# Patient Record
Sex: Male | Born: 1955 | Race: White | Hispanic: No | Marital: Married | Smoking: Never smoker
Health system: Southern US, Community
[De-identification: ages and names within clinical notes are randomized; demographics above are authoritative.]

## PROBLEM LIST (undated history)

## (undated) DIAGNOSIS — K219 Gastro-esophageal reflux disease without esophagitis: Secondary | ICD-10-CM

## (undated) DIAGNOSIS — J189 Pneumonia, unspecified organism: Secondary | ICD-10-CM

## (undated) DIAGNOSIS — G4730 Sleep apnea, unspecified: Secondary | ICD-10-CM

## (undated) DIAGNOSIS — J9621 Acute and chronic respiratory failure with hypoxia: Secondary | ICD-10-CM

## (undated) DIAGNOSIS — M60009 Infective myositis, unspecified site: Secondary | ICD-10-CM

## (undated) DIAGNOSIS — C8190 Hodgkin lymphoma, unspecified, unspecified site: Secondary | ICD-10-CM

## (undated) DIAGNOSIS — K859 Acute pancreatitis without necrosis or infection, unspecified: Secondary | ICD-10-CM

## (undated) DIAGNOSIS — F419 Anxiety disorder, unspecified: Secondary | ICD-10-CM

## (undated) DIAGNOSIS — J80 Acute respiratory distress syndrome: Secondary | ICD-10-CM

## (undated) DIAGNOSIS — I10 Essential (primary) hypertension: Secondary | ICD-10-CM

## (undated) DIAGNOSIS — D62 Acute posthemorrhagic anemia: Secondary | ICD-10-CM

## (undated) DIAGNOSIS — E119 Type 2 diabetes mellitus without complications: Secondary | ICD-10-CM

## (undated) DIAGNOSIS — M009 Pyogenic arthritis, unspecified: Secondary | ICD-10-CM

## (undated) DIAGNOSIS — U071 COVID-19: Secondary | ICD-10-CM

## (undated) DIAGNOSIS — Q6589 Other specified congenital deformities of hip: Secondary | ICD-10-CM

## (undated) DIAGNOSIS — J9383 Other pneumothorax: Secondary | ICD-10-CM

## (undated) HISTORY — PX: OTHER SURGICAL HISTORY: SHX169

## (undated) HISTORY — PX: JOINT REPLACEMENT: SHX530

## (undated) HISTORY — DX: Acute pancreatitis without necrosis or infection, unspecified: K85.90

## (undated) HISTORY — PX: HERNIA REPAIR: SHX51

## (undated) HISTORY — DX: Sleep apnea, unspecified: G47.30

## (undated) HISTORY — DX: Infective myositis, unspecified site: M60.009

## (undated) HISTORY — DX: Hodgkin lymphoma, unspecified, unspecified site: C81.90

---

## 1998-08-04 ENCOUNTER — Emergency Department (HOSPITAL_COMMUNITY): Admission: EM | Admit: 1998-08-04 | Discharge: 1998-08-04 | Payer: Self-pay | Admitting: Emergency Medicine

## 1998-08-04 ENCOUNTER — Encounter: Payer: Self-pay | Admitting: Psychology

## 1998-10-01 ENCOUNTER — Emergency Department (HOSPITAL_COMMUNITY): Admission: EM | Admit: 1998-10-01 | Discharge: 1998-10-01 | Payer: Self-pay | Admitting: Emergency Medicine

## 1998-11-23 ENCOUNTER — Emergency Department (HOSPITAL_COMMUNITY): Admission: EM | Admit: 1998-11-23 | Discharge: 1998-11-23 | Payer: Self-pay | Admitting: Emergency Medicine

## 1998-11-23 ENCOUNTER — Encounter: Payer: Self-pay | Admitting: Emergency Medicine

## 2000-02-02 ENCOUNTER — Encounter: Payer: Self-pay | Admitting: Family Medicine

## 2000-02-02 ENCOUNTER — Ambulatory Visit (HOSPITAL_COMMUNITY): Admission: RE | Admit: 2000-02-02 | Discharge: 2000-02-02 | Payer: Self-pay | Admitting: Family Medicine

## 2000-02-16 ENCOUNTER — Encounter: Payer: Self-pay | Admitting: Family Medicine

## 2000-02-16 ENCOUNTER — Ambulatory Visit (HOSPITAL_COMMUNITY): Admission: RE | Admit: 2000-02-16 | Discharge: 2000-02-16 | Payer: Self-pay | Admitting: Family Medicine

## 2000-05-09 ENCOUNTER — Ambulatory Visit (HOSPITAL_COMMUNITY): Admission: RE | Admit: 2000-05-09 | Discharge: 2000-05-09 | Payer: Self-pay | Admitting: Family Medicine

## 2000-05-09 ENCOUNTER — Encounter: Payer: Self-pay | Admitting: Family Medicine

## 2000-08-25 ENCOUNTER — Encounter: Admission: RE | Admit: 2000-08-25 | Discharge: 2000-08-25 | Payer: Self-pay | Admitting: Family Medicine

## 2000-08-25 ENCOUNTER — Encounter: Payer: Self-pay | Admitting: Family Medicine

## 2000-12-03 ENCOUNTER — Emergency Department (HOSPITAL_COMMUNITY): Admission: EM | Admit: 2000-12-03 | Discharge: 2000-12-03 | Payer: Self-pay | Admitting: Emergency Medicine

## 2000-12-03 ENCOUNTER — Encounter: Payer: Self-pay | Admitting: Internal Medicine

## 2001-02-27 ENCOUNTER — Encounter: Payer: Self-pay | Admitting: Orthopedic Surgery

## 2001-03-05 ENCOUNTER — Inpatient Hospital Stay (HOSPITAL_COMMUNITY): Admission: RE | Admit: 2001-03-05 | Discharge: 2001-03-09 | Payer: Self-pay | Admitting: Orthopedic Surgery

## 2001-03-05 ENCOUNTER — Encounter: Payer: Self-pay | Admitting: Orthopedic Surgery

## 2002-12-24 ENCOUNTER — Ambulatory Visit (HOSPITAL_BASED_OUTPATIENT_CLINIC_OR_DEPARTMENT_OTHER): Admission: RE | Admit: 2002-12-24 | Discharge: 2002-12-24 | Payer: Self-pay | Admitting: Family Medicine

## 2004-05-18 ENCOUNTER — Ambulatory Visit: Payer: Self-pay | Admitting: Family Medicine

## 2004-05-21 ENCOUNTER — Ambulatory Visit: Payer: Self-pay | Admitting: Family Medicine

## 2004-06-01 ENCOUNTER — Ambulatory Visit: Payer: Self-pay

## 2004-06-08 ENCOUNTER — Ambulatory Visit: Payer: Self-pay | Admitting: Family Medicine

## 2004-07-16 ENCOUNTER — Ambulatory Visit: Payer: Self-pay | Admitting: Family Medicine

## 2005-05-11 ENCOUNTER — Inpatient Hospital Stay (HOSPITAL_COMMUNITY): Admission: RE | Admit: 2005-05-11 | Discharge: 2005-05-14 | Payer: Self-pay | Admitting: Orthopedic Surgery

## 2006-08-11 ENCOUNTER — Encounter: Payer: Self-pay | Admitting: Internal Medicine

## 2006-08-11 ENCOUNTER — Ambulatory Visit: Payer: Self-pay | Admitting: Internal Medicine

## 2007-03-23 ENCOUNTER — Encounter (INDEPENDENT_AMBULATORY_CARE_PROVIDER_SITE_OTHER): Payer: Self-pay | Admitting: *Deleted

## 2007-03-23 ENCOUNTER — Ambulatory Visit: Payer: Self-pay | Admitting: Internal Medicine

## 2007-03-23 DIAGNOSIS — E291 Testicular hypofunction: Secondary | ICD-10-CM

## 2007-03-23 DIAGNOSIS — G4730 Sleep apnea, unspecified: Secondary | ICD-10-CM | POA: Insufficient documentation

## 2007-03-23 DIAGNOSIS — E739 Lactose intolerance, unspecified: Secondary | ICD-10-CM | POA: Insufficient documentation

## 2007-04-13 ENCOUNTER — Ambulatory Visit: Payer: Self-pay | Admitting: Internal Medicine

## 2007-04-17 ENCOUNTER — Encounter: Payer: Self-pay | Admitting: Internal Medicine

## 2007-04-26 ENCOUNTER — Ambulatory Visit: Payer: Self-pay | Admitting: Internal Medicine

## 2007-04-26 ENCOUNTER — Encounter: Payer: Self-pay | Admitting: Internal Medicine

## 2007-04-26 HISTORY — PX: COLONOSCOPY: SHX174

## 2007-05-15 ENCOUNTER — Encounter: Payer: Self-pay | Admitting: Internal Medicine

## 2007-10-22 IMAGING — CR DG HIP (WITH OR WITHOUT PELVIS) 2-3V*L*
3 series · 3 of 3 positions shown · non-contrast
Comparison: None.

CLINICAL DATA: Left hip pain.  Osteoarthritis.
 LEFT HIP AND PELVIS - 3 VIEW:

[t pelvis a.p.]
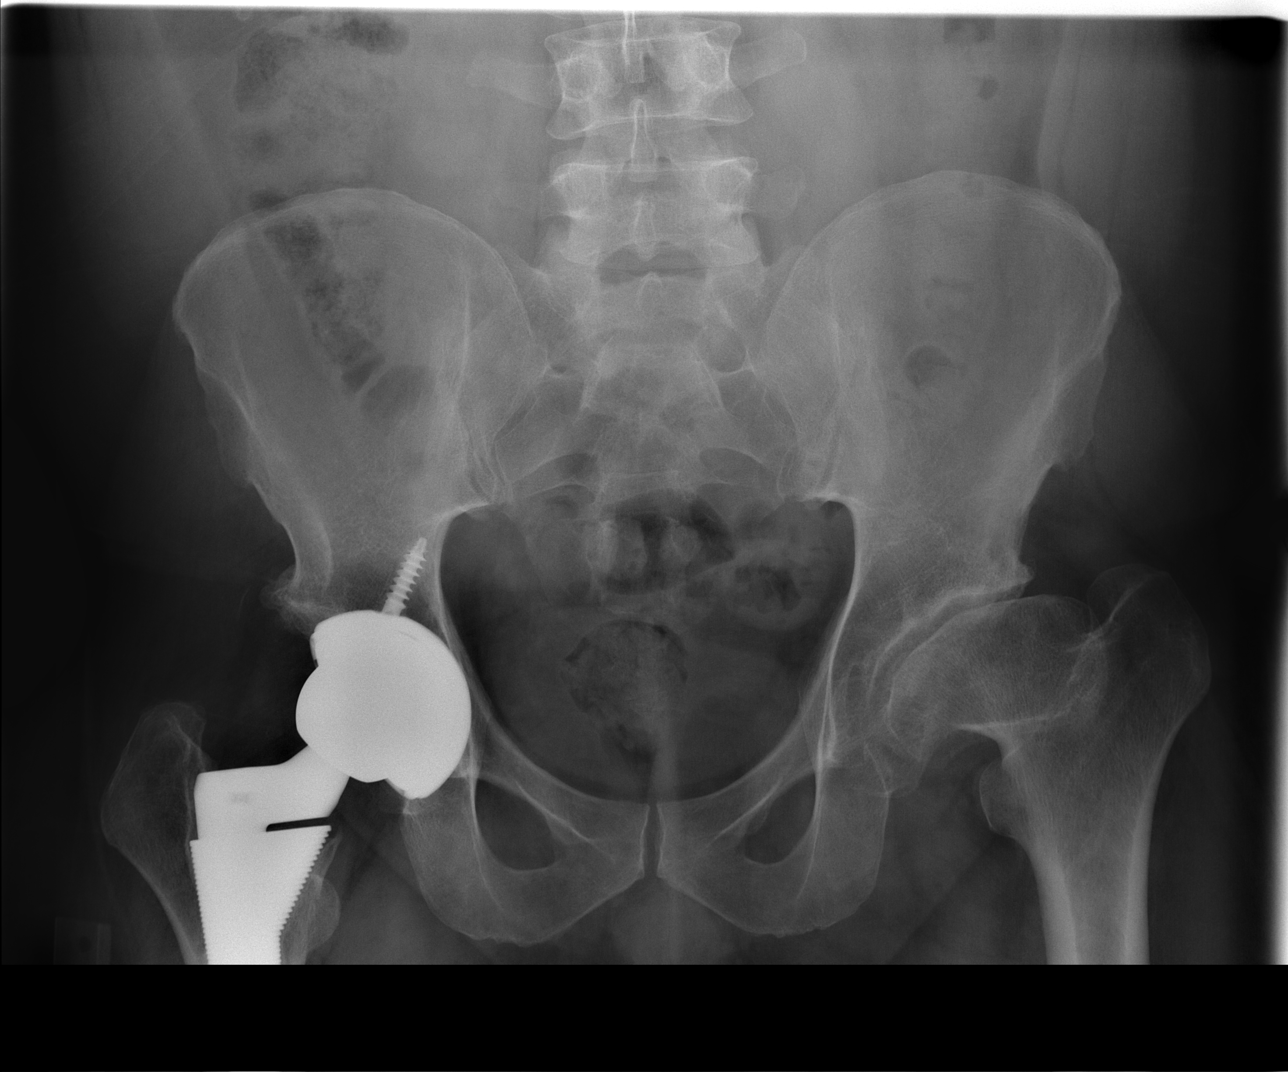

[t hip ap left]
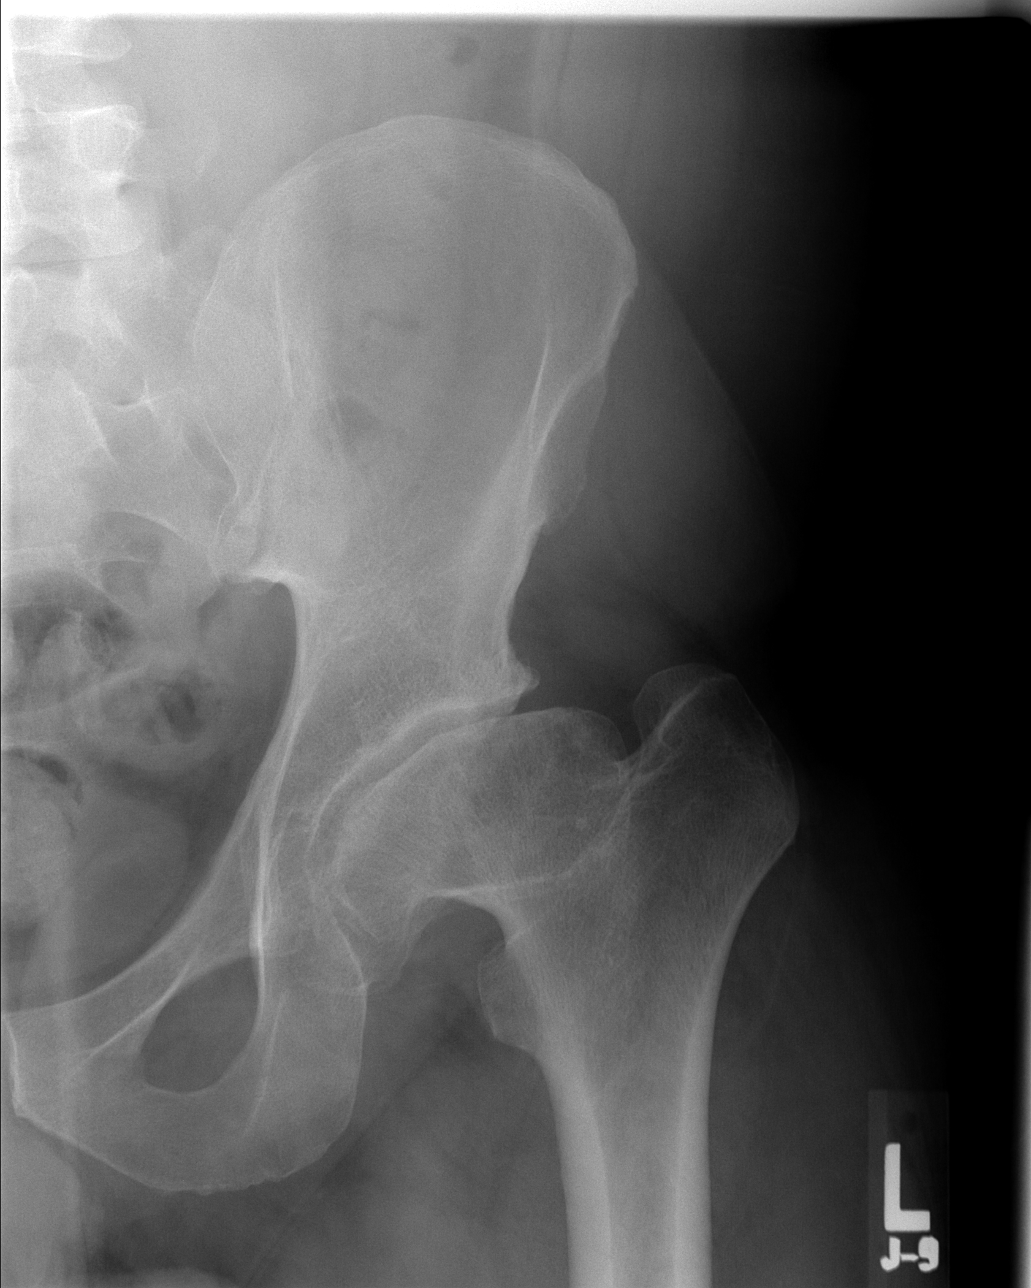

[t hip frog leg left]
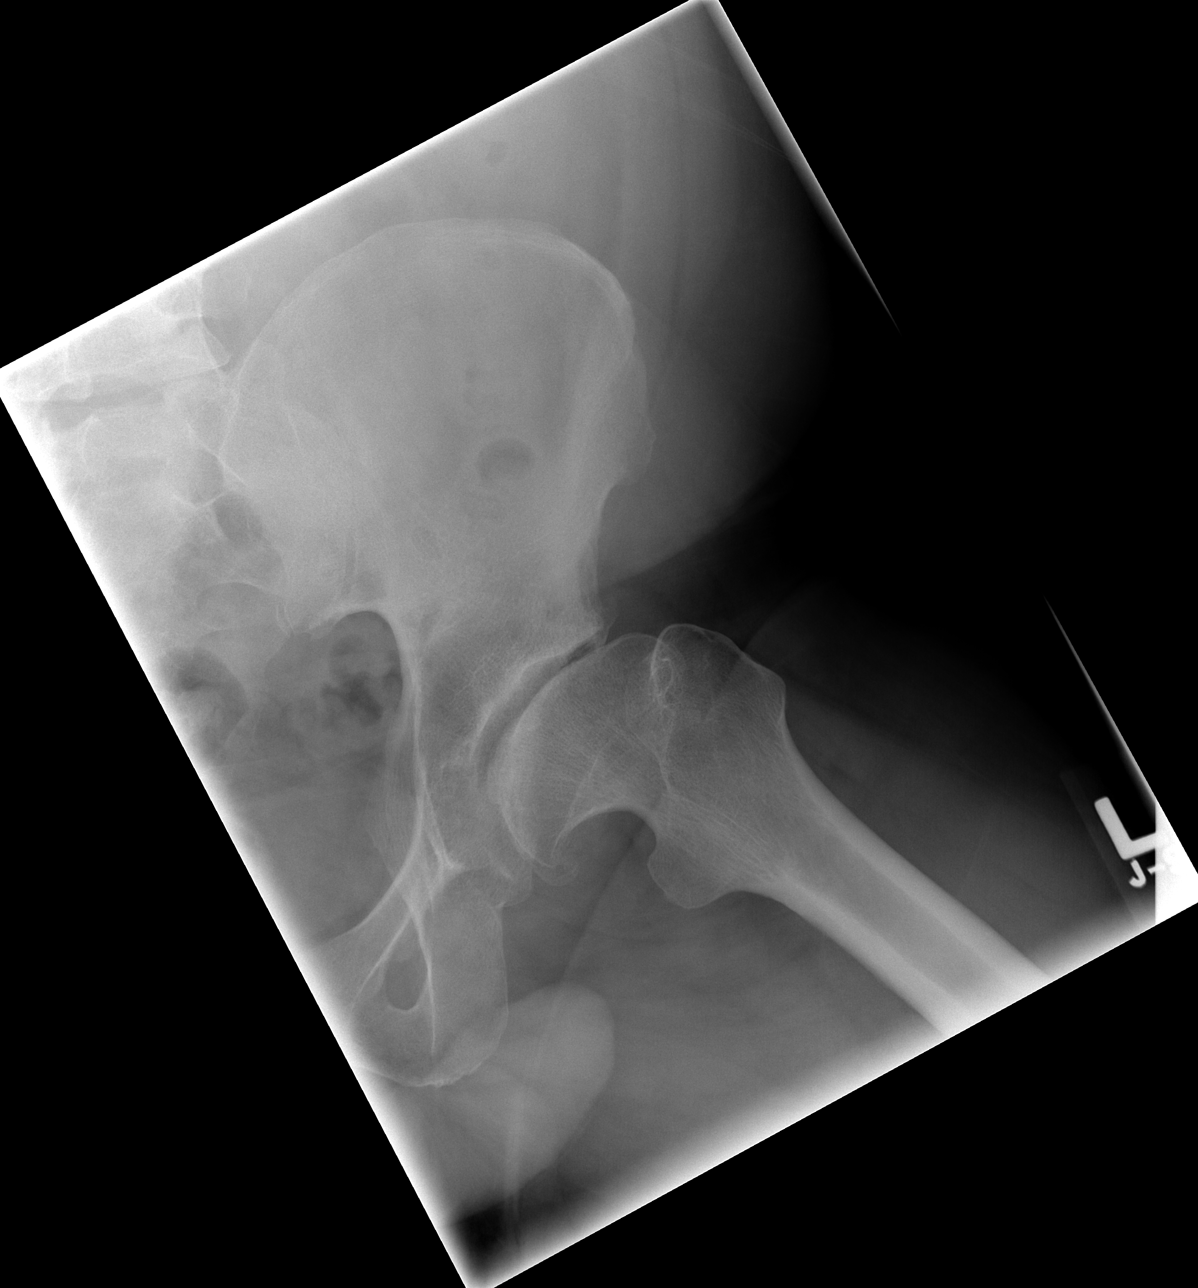

[3 of 3 positions shown; findings below may reference images not displayed]

FINDINGS: Frontal pelvis shows the patient to be status post a right total hip replacement.  The left hip is dysmorphic with flattening of the femoral head.  Acetabular reticular cortex is irregular and sclerotic.  
 No evidence for acute fracture.
IMPRESSION: Dysmorphic left hip with irregularity of the cortical surfaces and subchondral sclerosis.

## 2007-10-22 IMAGING — CR DG CHEST 2V
2 series · 2 of 2 positions shown · non-contrast
Comparison: 12/03/00.

CLINICAL DATA: Pre-op evaluation for left hip osteoarthritis. 
 CHEST - 2 VIEW ? 05/06/05:

[w chest pa *]
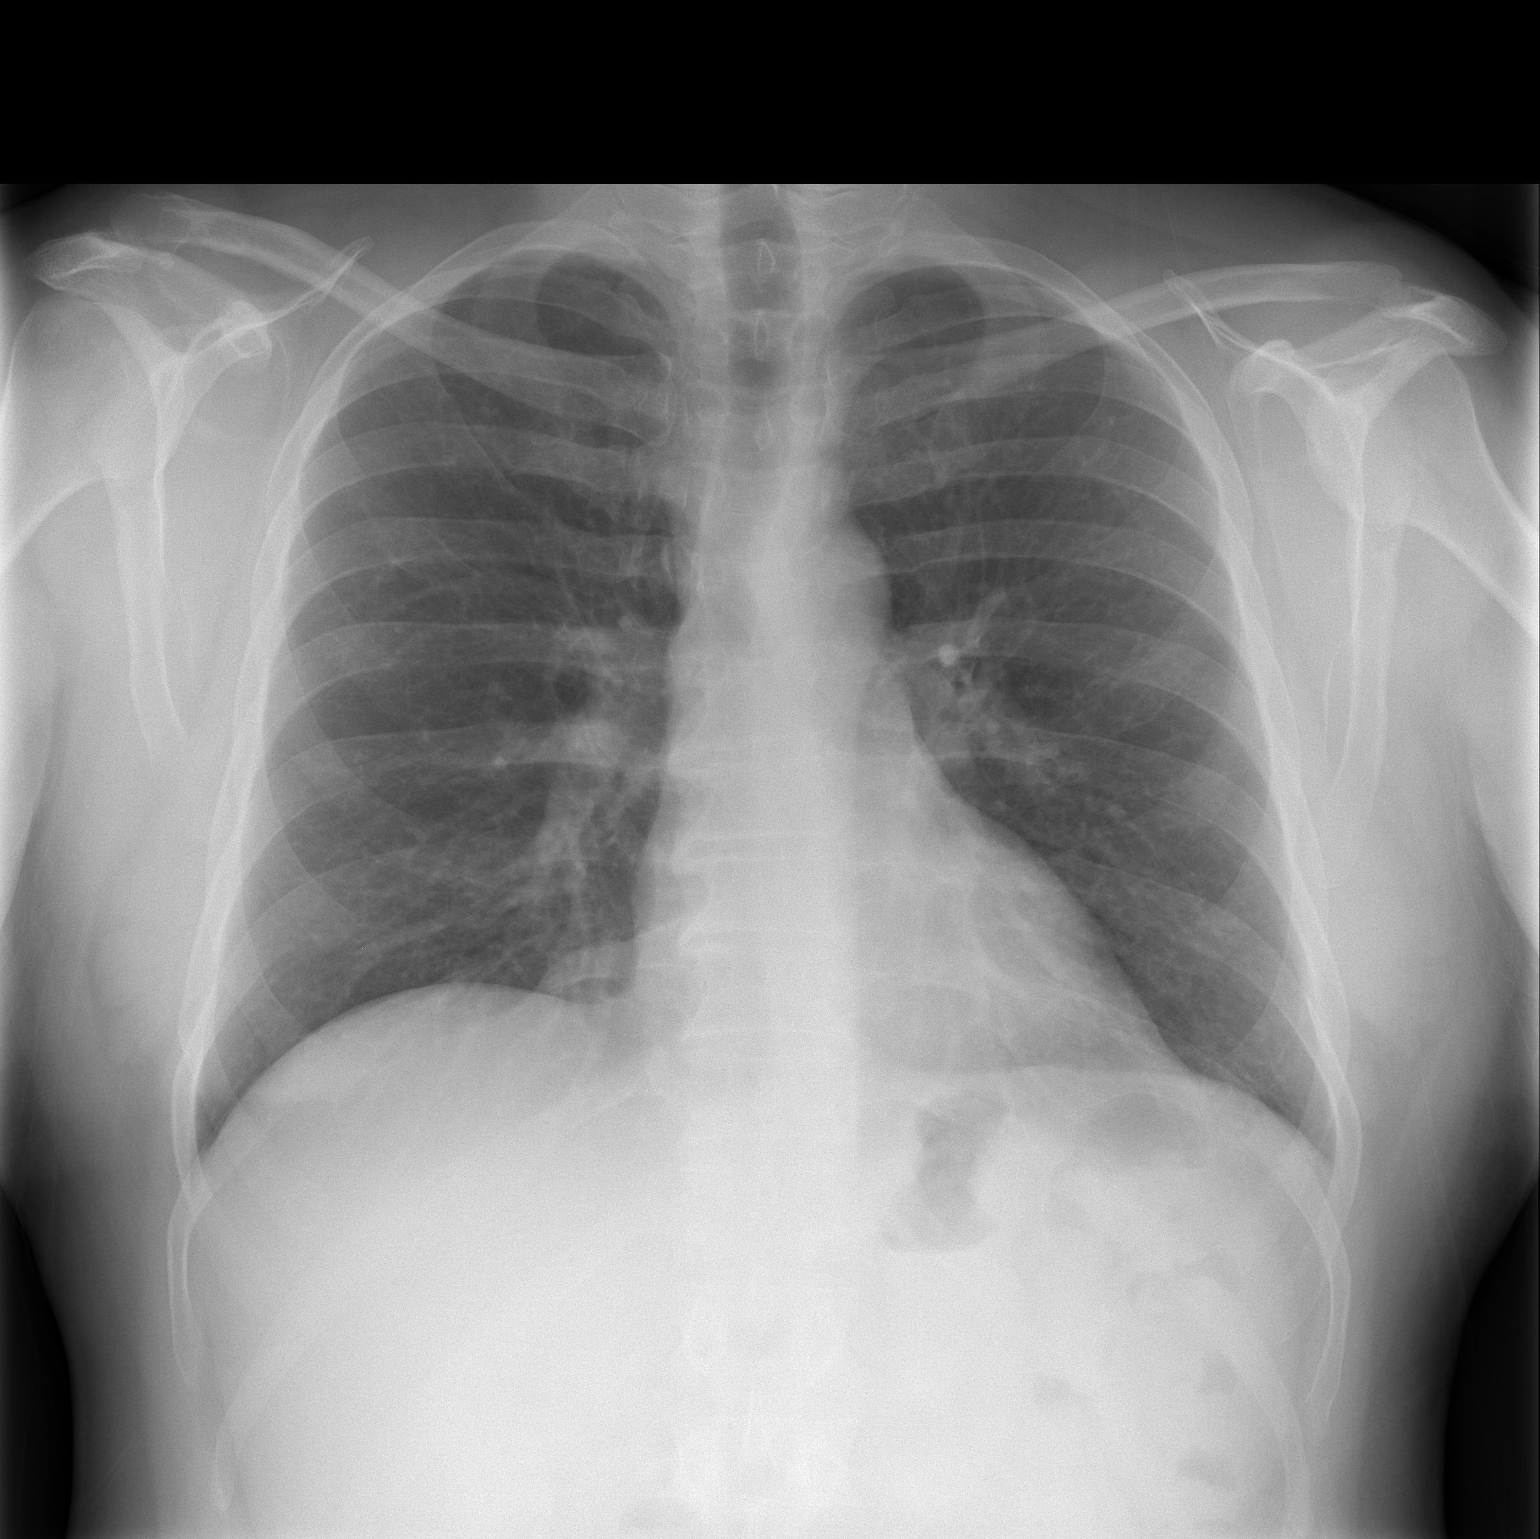

[w chest lat *]
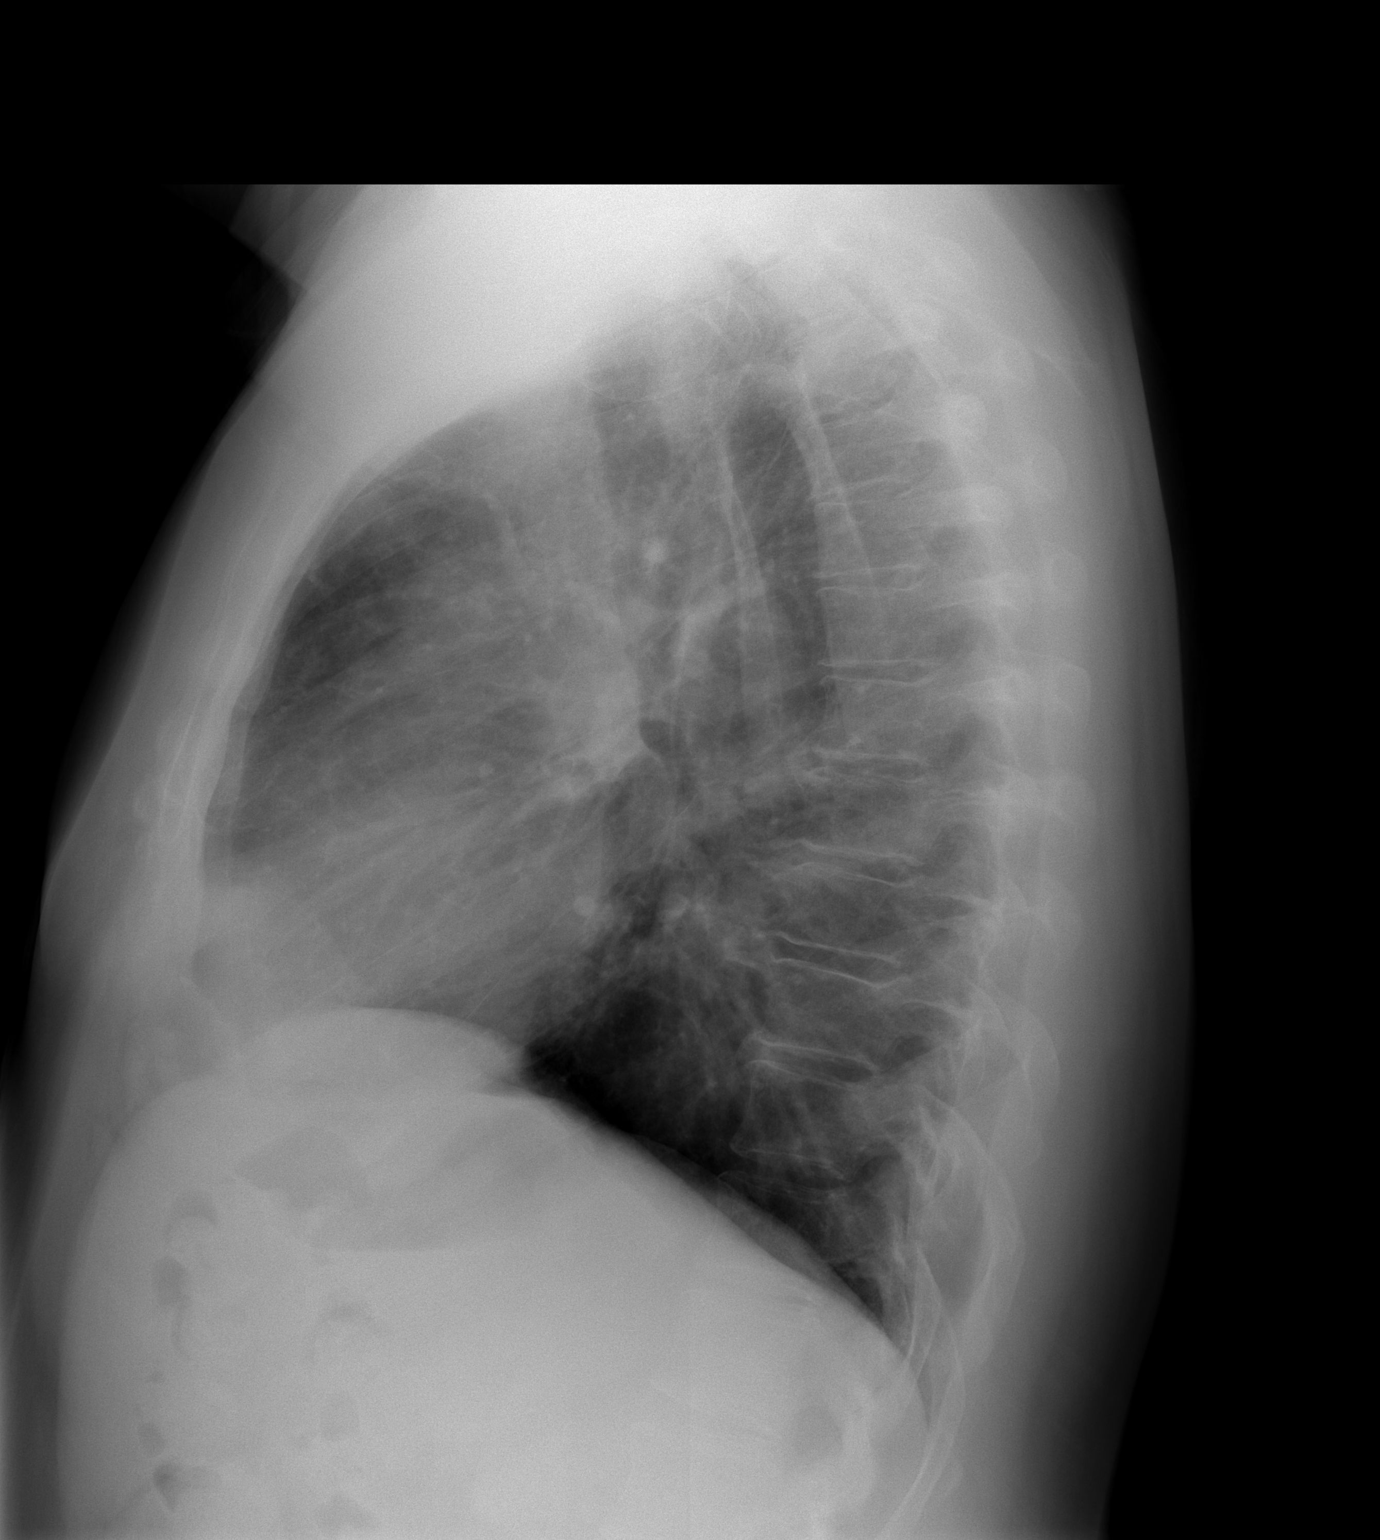

[2 of 2 positions shown; findings below may reference images not displayed]

The heart size and mediastinal contours are within normal limits.  Both lungs are clear.  The visualized skeletal structures are unremarkable.
IMPRESSION: No active cardiopulmonary disease.

## 2007-10-27 IMAGING — CR DG PORTABLE PELVIS
1 series · 1 of 1 positions shown · non-contrast
Comparison: 05/06/2005.

CLINICAL DATA: Status-post left total hip arthroplasty.
 LEFT HIP, ONE VIEW ? 05/11/2005 ? (2535 HOURS):

[view not recorded]
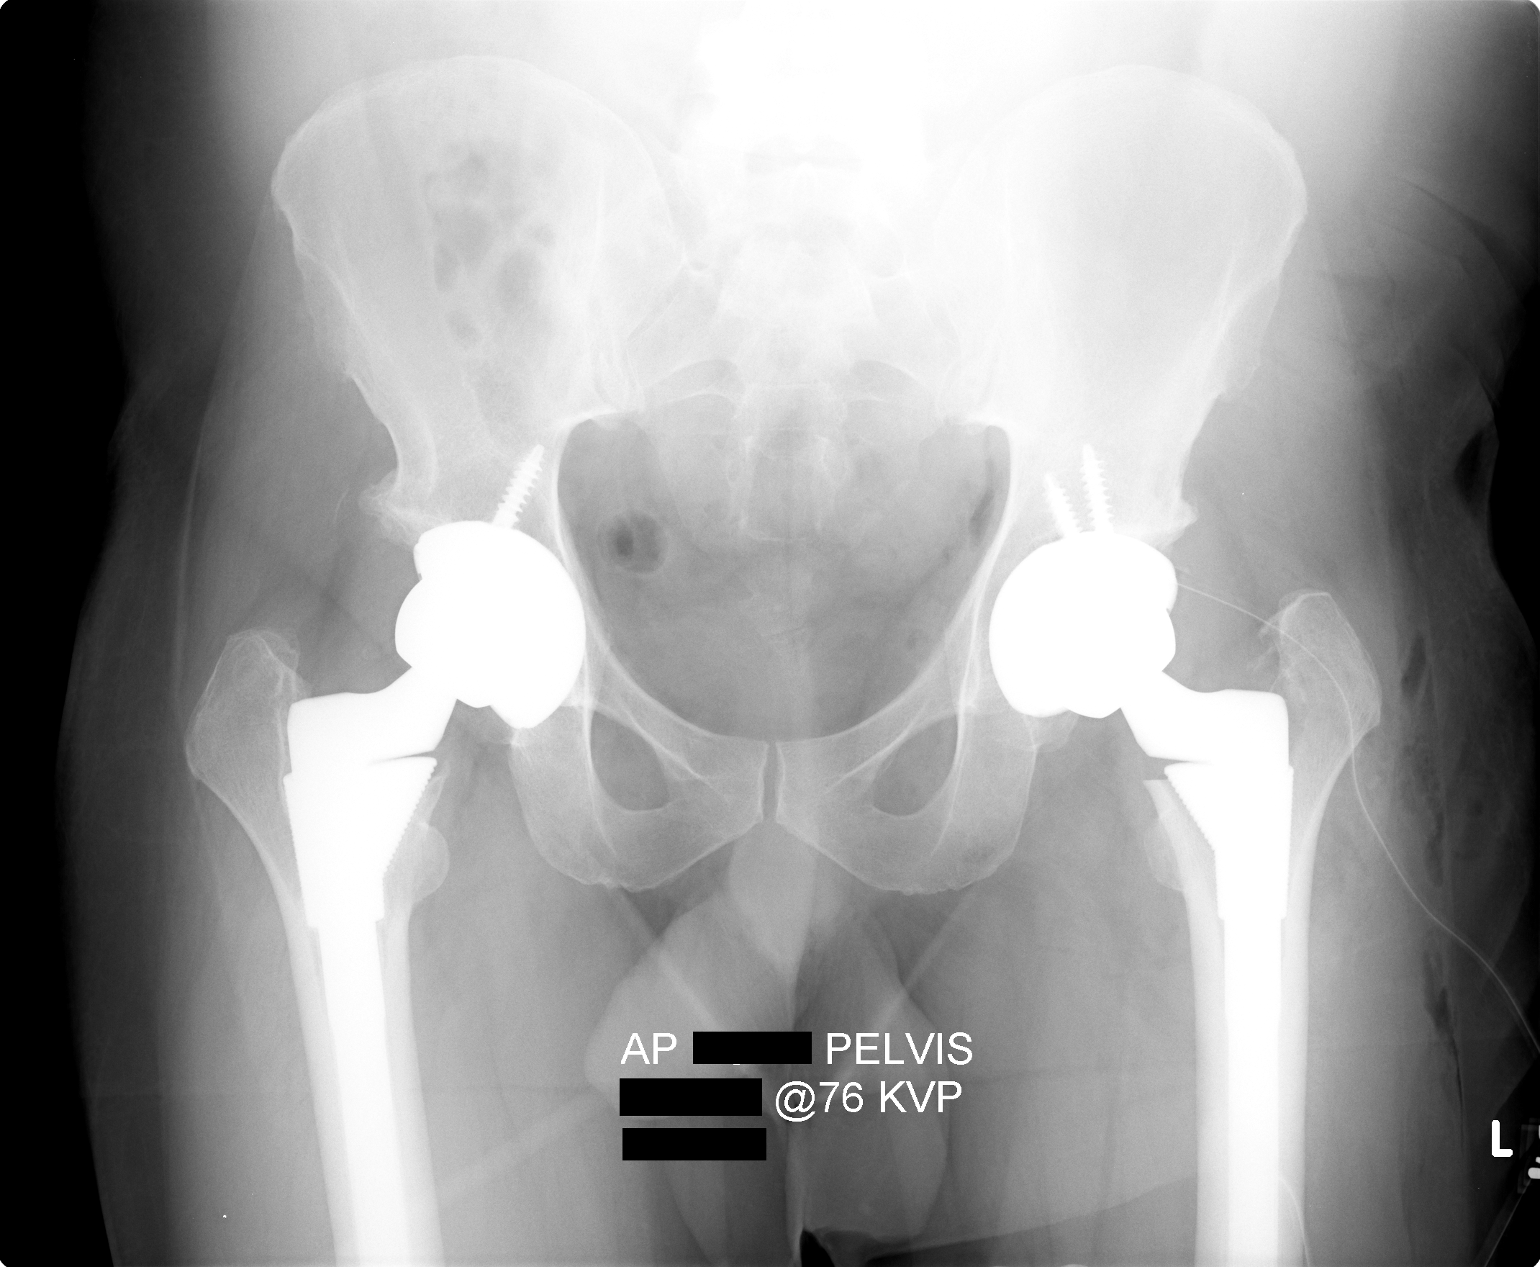

[1 of 1 positions shown; findings below may reference images not displayed]

FINDINGS: Frontal projection obtained in recovery demonstrates normal alignment of left acetabular and femoral components of a total left hip arthroplasty.
IMPRESSION: Normal alignment of left hip arthroplasty.
 PELVIS, ONE VIEW ? 05/11/2005:
FINDINGS: Frontal projection shows normal alignment of the newly placed left hip arthroplasty, as well as the previously placed right hip arthroplasty.  The rest of the bony pelvis is intact.
IMPRESSION: Normal alignment of newly placed left hip arthroplasty.

## 2007-10-27 IMAGING — CR DG HIP 1V*L*
1 series · 1 of 1 positions shown · non-contrast
Comparison: 05/06/2005.

CLINICAL DATA: Status-post left total hip arthroplasty.
 LEFT HIP, ONE VIEW ? 05/11/2005 ? (2535 HOURS):

[view not recorded]
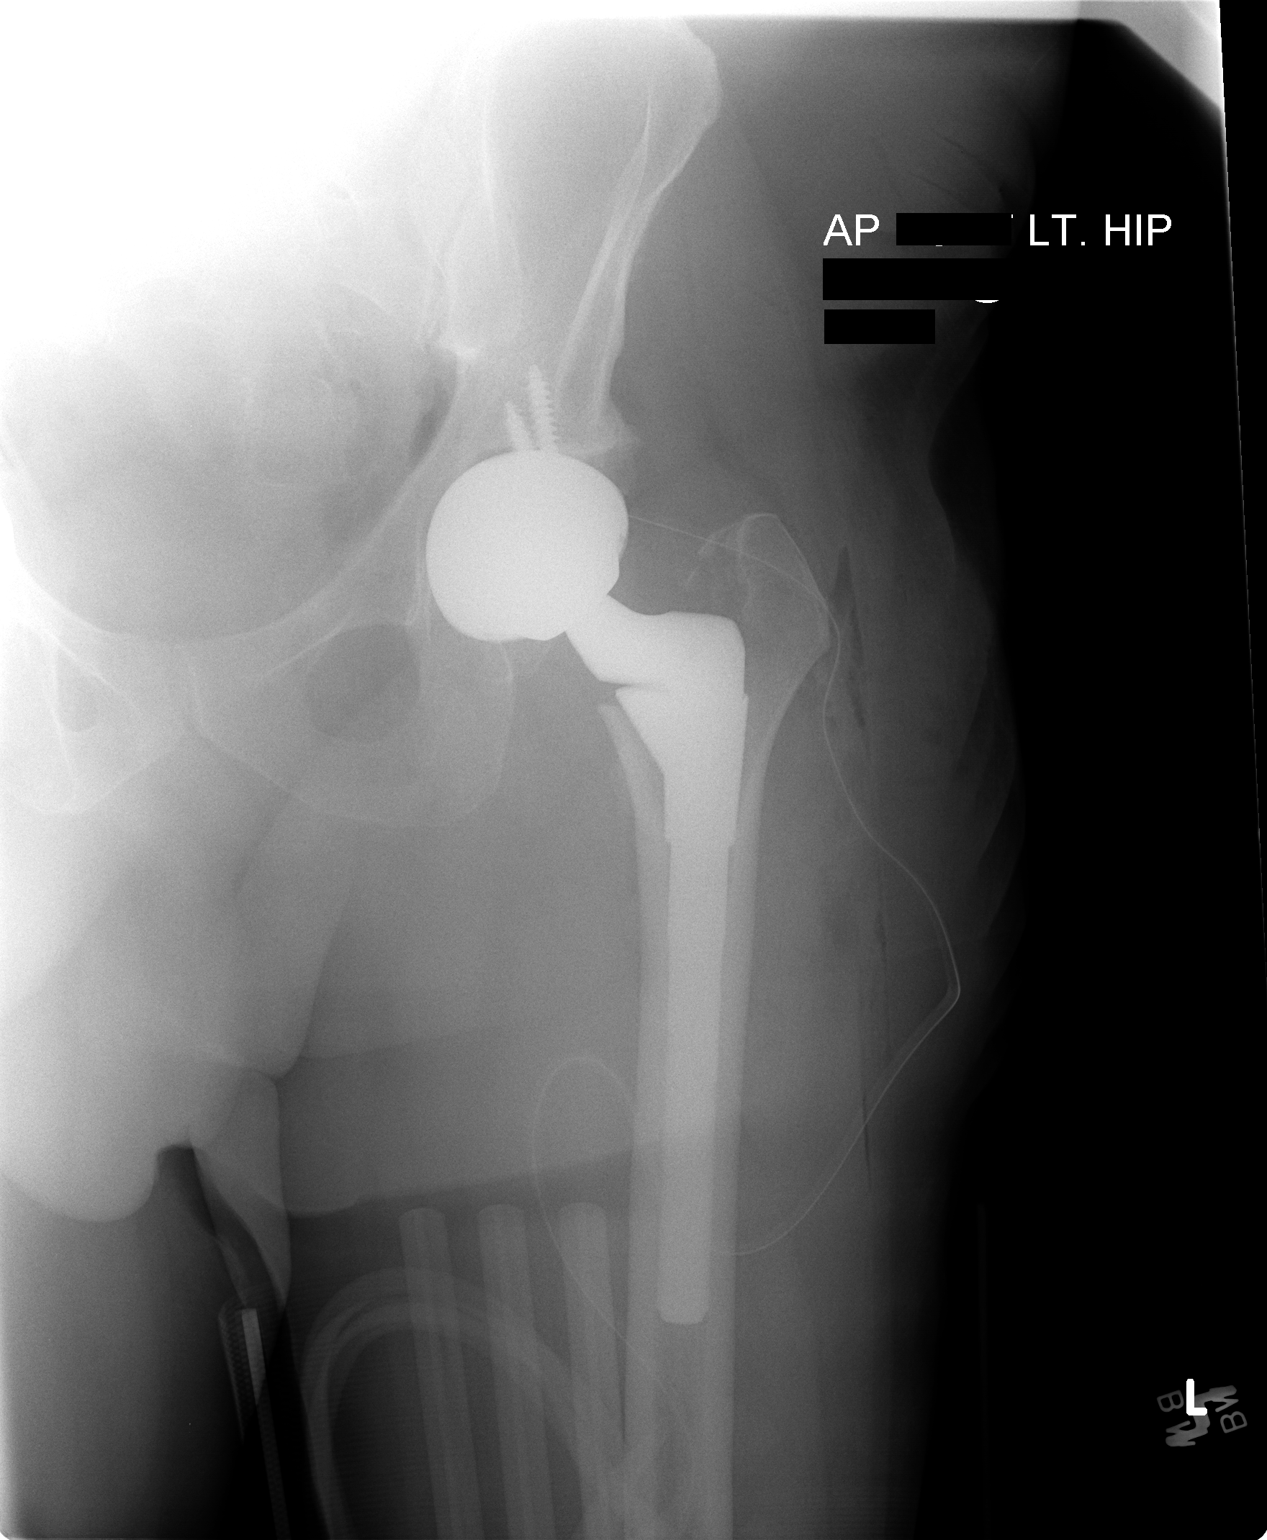

[1 of 1 positions shown; findings below may reference images not displayed]

FINDINGS: Frontal projection obtained in recovery demonstrates normal alignment of left acetabular and femoral components of a total left hip arthroplasty.
IMPRESSION: Normal alignment of left hip arthroplasty.
 PELVIS, ONE VIEW ? 05/11/2005:
FINDINGS: Frontal projection shows normal alignment of the newly placed left hip arthroplasty, as well as the previously placed right hip arthroplasty.  The rest of the bony pelvis is intact.
IMPRESSION: Normal alignment of newly placed left hip arthroplasty.

## 2007-11-06 ENCOUNTER — Telehealth (INDEPENDENT_AMBULATORY_CARE_PROVIDER_SITE_OTHER): Payer: Self-pay | Admitting: *Deleted

## 2010-05-09 LAB — CONVERTED CEMR LAB
ALT: 30 units/L (ref 0–53)
AST: 21 units/L (ref 0–37)
BUN: 11 mg/dL (ref 6–23)
Basophils Absolute: 0 10*3/uL (ref 0.0–0.1)
Bilirubin Urine: NEGATIVE
Blood in Urine, dipstick: NEGATIVE
CO2: 32 meq/L (ref 19–32)
Calcium: 9.2 mg/dL (ref 8.4–10.5)
Eosinophils Absolute: 0.1 10*3/uL (ref 0.0–0.6)
Eosinophils Relative: 1.3 % (ref 0.0–5.0)
GFR calc non Af Amer: 108 mL/min
Glucose, Urine, Semiquant: NEGATIVE
HDL: 22.6 mg/dL — ABNORMAL LOW (ref >39.0)
Ketones, urine, test strip: NEGATIVE
MCV: 87.5 fL (ref 78.0–100.0)
Neutro Abs: 3.7 10*3/uL (ref 1.4–7.7)
Neutrophils Relative %: 47.9 % (ref 43.0–77.0)
Nitrite: NEGATIVE
PSA: 0.83 ng/mL (ref 0.10–4.00)
Platelets: 247 10*3/uL (ref 150–400)
Protein, U semiquant: NEGATIVE
Sex Hormone Binding: 14 nmol/L (ref 13–71)
Sodium: 142 meq/L (ref 135–145)
Specific Gravity, Urine: 1.015
TSH: 2.21 microintl units/mL (ref 0.35–5.50)
Testosterone: 149.12 ng/dL — ABNORMAL LOW (ref 350.00–890)
Testosterone: 163.22 ng/dL — ABNORMAL LOW (ref 350–890)
Total CHOL/HDL Ratio: 7.5
Urobilinogen, UA: NEGATIVE
VLDL: 28 mg/dL (ref 0–40)
WBC Urine, dipstick: NEGATIVE
WBC: 7.7 10*3/uL (ref 4.5–10.5)
pH: 5

## 2010-08-27 NOTE — Discharge Summary (Signed)
NAMEKAL, WASSMUTH             ACCOUNT NO.:  [id number]   MEDICAL RECORD NO.:  [id number]          PATIENT TYPE:  INP   LOCATION:  1505                         FACILITY:  [facility]   PHYSICIAN:  Gaynelle Arabian, M.D.    DATE OF BIRTH:  1955/04/24   DATE OF ADMISSION:  05/11/2005  DATE OF DISCHARGE:  05/14/2005                                 DISCHARGE SUMMARY   ADMISSION DIAGNOSES:  1.  Perthes disease left hip.  2.  Reflux disease.   DISCHARGE DIAGNOSES:  1.  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  2.  Postoperative hyponatremia.  3.  Mild postoperative blood loss anemia that did not require transfusion.  4.  Reflux disease.   PROCEDURE:  On May 11, 2005 left total hip, surgeon Dr. Wynelle Link,  assistant Arlee Muslim, PA-C. Anesthesia general.   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 He has gone  on to significant degenerative changes and now presents for a total hip  arthroplasty.   CONSULTATIONS:  None.   LABORATORY DATA:  Preop CBC, hemoglobin 14.5, hematocrit 42.0, white count  normal 8.2. Postop hemoglobin 11.6. Last noted H&H 10.1 and 29.1. PT and PTT  preop 13.3 and 33 respectively. INR 1.0, serial pro times followed. Last  noted PT/INR 23.2 and 2.0. Chem panel on admission all within normal limits.  Postop sodium dropped from 137 to 134, stabilized at 134. Remaining  electrolytes remained within normal limits. Calcium dropped from 9.2 to 7.8.  Urinalysis preop negative. Blood group type A negative. Two view chest preop  May 06, 2005 acute cardiopulmonary disease. Left hip films May 06, 2005 dysmorphic left hip with irregularity of the cortical surfaces and  subchondral sclerosis.   HOSPITAL COURSE:  The patient was admitted to [facility], taken to  the OR and underwent the above stated  procedure, tolerated it well and was  later transferred to the recovery room and the orthopedic floor for  postoperative care. Vital signs were followed, started on PCA and p.o.  analgesics for pain control following surgery. He was young and healthy,  felt he would do well with this therapy. Physical therapy started on  postoperative day 1, hemovac drain pulled on day 1 which was placed at the  time of surgery. Postop hemoglobin 11.6, added protonix as needed for  reflux. He actually did well with his physical therapy, getting up out of  bed with PT. By day 2, there was some soreness but doing well on his pain  control, weaned over to p.o. medications, ambulated 200 feet that morning  and again 200 feet later that afternoon. He was seen in morning rounds and  felt if he did well he would be going home. Arrangements were made on day 2  and he was ready to go by day 3. Dressing was changed on day 2, incision  looked good. He was seen in rounds on day 3 doing well. His INR was 2.0  and  he was discharged home.   PLAN:  1.  The patient was discharged home on May 14, 2005.  2.  Discharge diagnoses, please see above.  3.  Discharge meds; Coumadin, Percocet, Robaxin.  4.  Diet as tolerated.  5.  Activity - partial weight bearing 25-50% to the left lower extremity.      Home health PT, home health nursing, gait training, ambulation, ADLs,      total protocol.   FOLLOW UP:  2 weeks.   DISPOSITION:  Home.   CONDITION ON DISCHARGE:  Improved.      Alexzandrew L. Dara Lords, P.A.      Gaynelle Arabian, M.D.  Electronically Signed    ALP/MEDQ  D:  06/13/2005  T:  06/14/2005  Job:  [id number]   cc:   Wellington Hampshire, M.D. [facility]  [phone]  [place] [address]

## 2010-08-27 NOTE — H&P (Signed)
NAMEGARRY, DIETL             ACCOUNT NO.:  [id number]   MEDICAL RECORD NO.:  [id number]          PATIENT TYPE:  INP   LOCATION:  NA                           FACILITY:  [facility]   PHYSICIAN:  Gaynelle Arabian, M.D.    DATE OF BIRTH:  08-01-1955   DATE OF ADMISSION:  05/11/2005  DATE OF DISCHARGE:                                HISTORY & PHYSICAL   DATE OF OFFICE VISIT HISTORY AND PHYSICAL:  May 05, 2005.   CHIEF COMPLAINT:  Left hip pain.   HISTORY OF PRESENT ILLNESS:  The patient is a 55 year old male known to Dr.  Wynelle Link having previously undergone a right total hip arthroplasty.  He has  done very well with his right hip a couple years out.  His left hip has  known progressive arthritis.  He is at a point here it has gotten worse.  It  is interfering with his daily activities.  He has reached a point where he  would like to have something done about it.  The risks and benefits have  been discussed, and the patient has elected to undergo the surgical  procedure.   ALLERGIES:  No known drug allergies.   CURRENT MEDICATIONS:  Vicodin p.r.n.   PAST MEDICAL HISTORY:  1.  Reflux disease.  2.  Arthritis.   PAST SURGICAL HISTORY:  1.  Right shoulder surgery for separation in 1996.  2.  Right total hip replacement in 2001.  3.  Right inguinal hernia repair x2.  4.  Left inguinal hernia repair.  5.  Mandible surgery.   SOCIAL HISTORY:  Married.  He is a Public affairs consultant.  Nonsmoker.  No  alcohol.  Two children.  Wife will be assisting with care after surgery.   FAMILY HISTORY:  Significant for lung cancer, skin cancer and arthritis.   REVIEW OF SYSTEMS:  GENERAL:  No fevers, chills or night sweats.  NEUROLOGIC:  No seizures, syncope or paralysis.  RESPIRATORY:  No shortness  of breath, productive cough or hemoptysis.  CARDIOVASCULAR:  No chest pain,  angina or orthopnea.  GI:  No nausea, vomiting, diarrhea or constipation.  GU:  No dysuria, hematuria or discharge.   MUSCULOSKELETAL:  Left hip found  in the history of present illness.   PHYSICAL EXAMINATION:  VITAL SIGNS:  Pulse 66, respirations 12, blood  pressure 128/74.  GENERAL:  A 55 year old white male, well-nourished, well-developed, in no  acute distress.  Alert, oriented and cooperative.  Good historian.  HEENT:  Normocephalic and atraumatic.  Pupils round and reactive.  Extraocular movements intact.  Oropharynx clear.  Extraocular movements  intact.  NECK:  Supple.  He does have upper and lower partial dentures.  CHEST:  Clear, anterior and posterior chest wall.  HEART:  Bradycardic rhythm.  Regular rhythm without murmur.  S1 and S2  noted.  ABDOMEN:  Soft, nontender.  Bowel sounds are present.  RECTAL/BREAST/GENITALIA:  Not done; not pertinent to present illness.  EXTREMITIES:  Left hip has flexion to 90 degrees, external rotation 10,  internal rotation 10, abduction 10.  Left leg is about three-quarters of an  inch shorter than the right.   IMPRESSION:  Perthes disease, left hip.   PLAN:  The patient admitted to [place] to undergo a left total  hip arthroplasty.  Surgery will be performed by Dr. Gaynelle Arabian.      Alexzandrew L. Dara Lords, P.A.      Gaynelle Arabian, M.D.  Electronically Signed    ALP/MEDQ  D:  05/10/2005  T:  05/10/2005  Job:  [id number]   cc:   Wellington Hampshire, M.D. [facility]  [phone]  [place] [address]

## 2010-08-27 NOTE — Discharge Summary (Signed)
[facility]  Patient:    Steve [name], Steve [name] Visit Number: [id number] MRN: [id number]          Service Type: SUR Location: 4W [id number] Attending Physician:  [name] Dictated by:   Trenda Moots Mahar, P.A. Admit Date:  03/05/2001 Discharge Date: 03/09/2001                             Discharge Summary  DATE OF BIRTH:  1956-03-02  ADMITTING DIAGNOSIS:  History of leg calf peripheral disease.  DISCHARGE DIAGNOSES: 1. Status post right total hip arthroplasty. 2. History of leg calf peripheral disease. 3. Postoperative hemorrhagic anemia which was stable and not symptomatic not    requiring transfusion.  PROCEDURE:  A right total hip arthroplasty by Dr. Gaynelle Arabian with the assistance of Tillie Rung, P.A.-C.  Anesthesia was general.  No complications.  Estimated blood loss 800 cc.  CONSULTATIONS:  None.  HISTORY OF PRESENT ILLNESS:  The patient is a 55 year old male who has a history of bilateral hip pain worse on the right as compared to the left.  The patient has a history of left calf peripheral disease for over 30 years.  He states that he has had increasing pain and has gotten to the point that it is almost constant in nature.  It does bother him at night and is interfering with his activities of daily living and his quality of life.  He was seen and evaluated by Dr. Wynelle Link at [facility] clinic and it was determined that he would benefit from operative intervention, specifically a total hip replacement.  Risks and benefits of this were discussed with the patient by Dr. Gaynelle Arabian.  He decided this was acceptable and he would like to proceed.  LABORATORY DATA AND X-RAY FINDINGS:  Prior to admission on February 27, 2001, CBC was within normal limits.  Complete metabolic panel was within normal limits with the exception of glucose at 121.  UA was normal.  Postoperatively, a CBC was ordered on November 25, and revealed WBC  of 24, RBC 4.09, hemoglobin 12.4, hematocrit 35.5, otherwise within normal limits.  Hemoglobin and hematocrit continued to be monitored throughout his postoperative course reaching a low of 9.9 and 28.0 respectively prior to discharge.  He was not symptomatic and did not require transfusion as result of any of the hemoglobin and hematocrits drawn.  Basic metabolic panel monitored postoperatively on November 25, with glucose 177, calcium 8.2, otherwise within normal limits. On November 26, glucose had decreased to 150, however, still above normal and a calcium of 8.1.  Otherwise, BMET was within normal limits.  Repeated on March 09, 2001, glucose was 124 and the rest of the BMET was within normal limits.  UA from March 06, 2001, revealed appearance to be cloudy with a small amount of hemoglobin, otherwise negative.  Urine cultures from March 06, 2001, revealed no growth at one day.  X-rays from February 27, 2001, of the right hip revealed marked degenerative changes noted of the right hip.  Changes presumably dating back to childhood. On March 05, 2001, portable x-rays of the hip revealed good position in AP portable view of the hip of right total hip prosthesis.  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  Good AP position on the right total hip prosthesis.  Pelvis shows no fracture.  There is no EKG found on the chart.  HOSPITAL  COURSE:  The patient was taken to the operative suite on March 05, 2001, for the above-listed procedure.  The patient tolerated the procedure very well without any intraoperative complications.  He was transferred to the recovery room in stable condition.  The patient was started on appropriate antibiotic course postoperatively and completed this course without any difficulty or complication.  His diet was slowly advanced to normal and he tolerated this very well.  Incentive spirometer was utilized hourly  while the patient was awakened in attempt to prevent any respiratory complications. Neurovascular checks were instituted postoperatively and remained intact without any problems throughout his hospital stay.  Pain was initially controlled utilizing PCA morphine.  He was eventually transitioned over to p.o. analgesics without any difficulty and pain control remained adequate. The patients Hemovac drain that was placed intraoperatively was discontinued on postop day #1 without any difficulty.  The patients operative dressing was taken down on postop day #2 revealing incision that looked very good without any signs or symptoms of infection.  Daily dressing changes were done throughout his hospital stay until discharge.  The incision continued to look very good without any signs or symptoms of infection.  Physical therapy and occupational therapy were consulted to work with the patient on his total hip protocol and precautions.  He was touchdown weightbearing to his operative extremity.  He progressed along very well with them eventually ambulating 50 feet completely independently.  For DVT prophylaxis, the patient was utilizing Arixtra and was not placed on Coumadin therapy.  For DVT prophylaxis, he was started immediately postoperatively on SCD devices as well as TED hose. Discharge planners were consulted to assist with setting up the patients home needs as well as arranging for home health services.  On March 09, 2001, the patient was doing very well.  He was orthopedically stable and had met all goals.  He was dramatically stable and ready for discharge to his home.  ASSESSMENT:  Right total hip arthroplasty doing very well and ready for discharge.  ACTIVITY:  Touchdown weightbearing to the right lower extremity.  Total hip protocol and precautions.  He is to use ice to the hip as needed for discomfort.  SPECIAL INSTRUCTIONS:  Daily dressing changes, supplies and instructions  were given for activity.  The patient may shower on postop day #5 and should keep the immobilizer on while asleep.  He is to follow up with Dr. Gaynelle Arabian  two weeks postoperatively and is instructed to call the office for an appointment for this.  DIET:  Preoperative as tolerated.  DISCHARGE MEDICATIONS: 1. Arixtra study, per protocol.  The patient has supplies and medications for    this. 2. Percocet p.r.n. pain. 3. Robaxin p.r.n. spasm.  CONDITION ON DISCHARGE:  Stable and improved.  DISPOSITION:  Discharged to his home with home health physical therapy per [place]. Dictated by:   Trenda Moots. Mahar, P.A. Attending Physician:  [name] DD:  03/26/01 TD:  03/27/01 Job: [id number]

## 2010-08-27 NOTE — Op Note (Signed)
NAMEFABIOLA, Steve [name]             ACCOUNT NO.:  [id number]   MEDICAL RECORD NO.:  [id number]          PATIENT TYPE:  INP   LOCATION:  X007                         [facility]ACILITY:  [facility]   PHYSICIAN:  Gaynelle Arabian, M.D.    DATE OF BIRTH:  28-Nov-1955   DATE OF PROCEDURE:  05/11/2005  DATE OF DISCHARGE:                                 OPERATIVE REPORT   PREOPERATIVE DIAGNOSIS:  Osteoarthritis left hip secondary to Perthes  disease.   POSTOPERATIVE DIAGNOSIS:  Osteoarthritis left hip secondary to Perthes  disease.   PROCEDURE:  Left total hip arthroplasty.   SURGEON:  Gaynelle Arabian, M.D.   ASSISTANT:  Alexzandrew L. Dara Lords, P.A.   ANESTHESIA:  General.   ESTIMATED BLOOD LOSS:  600.   DRAINS:  Hemovac x1.   COMPLICATIONS:  None.   CONDITION:  Stable to recovery.   CLINICAL NOTE:  Steve [name] is a 55 year old male who has end-stage degenerative  change of his left hip. He had childhood Perthes and he has gone on to  significant degenerative change. He has failed nonoperative management and  presents for total hip arthroplasty. He has had a previous very successful  right total hip arthroplasty.   PROCEDURE IN DETAIL:  After successful administration of general anesthetic,  the patient is placed in the right lateral decubitus position with his left  side up and held with a hip positioner. The left lower extremity is isolated  from his perineum with plastic drapes and prepped and draped in the usual  sterile fashion. A standard posterolateral incision is made with a 10 blade  through the subcutaneous tissue to the level of the fascia lata which was  incised in line with the skin incision. The sciatic nerve is palpated and  protected and the short rotators isolated off the femur. Capsulectomy is  performed and the hip is dislocated. The center of the femoral head is  marked but it is such a misshapen femoral head I was not sure if it was the  center. We placed the trial prosthesis such  that the center of the trial  head would correspond to the level at the tip of the greater trochanter. The  osteotomy line is marked on the femoral neck and osteotomy is made with an  oscillating saw. This is essentially the same height from the lesser  trochanter as the cut was on the other side based on his x-rays and  templating. The femoral head is resected and then femur retracted anteriorly  to gain acetabular exposure.   The acetabulum was significantly dysplastic. We removed the anterior labrum  and osteophytes around the rim. We reamed centrally with a 47 mm reamer  coursing in increments of 2 mm up to 53 mm and then a 54 mm pinnacle  acetabular shell was placed in anatomic position and transfixed with 2 dome  screws. The apex hole eliminator was then placed and the 36 mm neutral  Ultamet metal liner is placed. This is placed for a metal-on-metal hip  replacement.   The femur is prepared with the canal finder and then irrigation. Axial  reaming is  performed up to 13.5 mm, proximal reaming to 18B and the sleeve  machined to a small. An 18B small trial sleeve is placed with an 18 x 13  stem and a 36 plus 8 neck. His native anteversion was neutral so we dialed  in approximately 20 degrees of anteversion. A trial 36 plus 0 head is  placed. The hip is reduced with great stability. Full extension, full  external rotation, 70 degrees flexion, 40 degrees adduction and 90 degrees  internal rotation and 90 degrees of flexion and 70 degrees internal  rotation. He was short close to an inch preoperatively and now by placing  the left leg on top of the right it is felt as though the leg lengths are  equal. The hip is then dislocated and the trial femoral component removed.  The permanent 18B small sleeve is placed into the proximal femur and then  the 18 x 13 stem and a 36 plus8 neck placed again about 20 degrees beyond  his native anteversion. The 36 plus 0 head is then placed and the hip  is  reduced with the same stability parameters. The wound is copiously irrigated  saline solution and short rotators reattached to the femur through drill  holes. The fascia lata was closed over a Hemovac drain with interrupted #1  Vicryl, subcu closed with #1 and 2-0 Vicryl and subcuticular closed with a  running 4-0 Monocryl. The incision is cleaned and dried and Steri-Strips and  a bulky sterile dressing applied. The drain is hooked to suction and he is  awakened and transported to recovery in stable condition.      Gaynelle Arabian, M.D.  Electronically Signed     FA/MEDQ  D:  05/11/2005  T:  05/12/2005  Job:  [id number]

## 2010-08-27 NOTE — H&P (Signed)
[facility]  Patient:    Steve [name], Steve [name] Visit Number: [id number] MRN: [id number]          Service Type: Attending:  Gaynelle Arabian, M.D. Dictated by:   Avelina Laine, P.A.-C. Adm. Date:  03/05/01                           History and Physical  CHIEF COMPLAINT:  Bilateral hip pain, right greater than the left.  HISTORY OF PRESENT ILLNESS:  [name] is a 55 year old white male who has a history of bilateral hip pain, worse in the right as compared to the left.  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  The patient states that he has had increasing pain and interference with his activities of daily living over these years.  He was seen and evaluated by Dr. Wynelle Link in [facility], and it was determined that he would benefit from operative intervention.  ALLERGIES:  He states that postoperatively Vicodin caused him GI upset; however, this has not occurred when taken with food.  MEDICATIONS:  Vicodin one p.o. q.4-6h. p.r.n.  He states he takes these very sparingly.  He also takes Advil Liquid-Gel capsules p.r.n.  PAST MEDICAL HISTORY:  Noncontributory.  PAST SURGICAL HISTORY:  He has had a right shoulder surgery.  He has had a right inguinal hernia repair x 2 as well as the left inguinal hernia repair. He has had mandible surgery.  SOCIAL HISTORY:  He is married with two children.  He is a nonsmoker.  He states he has an occasional beer, maybe one to two a month.  He lives in a one-level home.  FAMILY PHYSICIAN:  Wellington Hampshire, M.D.  FAMILY HISTORY:  He states his mother has had an MI.  REVIEW OF SYSTEMS:  GENERAL:  He denies any recent weight loss, fevers or chills, night sweats, nausea or vomiting.  HEENT:  No history of chronic headaches, blurred vision, seeing spots or specks, ringing of the ears, sore throat, or runny nose.  CHEST:  He denies any productive cough, chronic  cough, hemoptysis.  CARDIOVASCULAR:  He denies any shortness of breath, syncopal episodes, chest pains, or irregular heartbeats.  GASTROINTESTINAL/ GENITOURINARY:  He denies any history of chronic diarrhea, constipation, melena, bright red stools per rectum, dysuria, frequency, urgency, or nocturia.  EXTREMITIES:  Please see HPI.  NEUROLOGIC:  He denies any history of seizures or stroke.  PHYSICAL EXAMINATION:  VITAL SIGNS:  Blood pressure is 118/78, respirations are 12, pulse is 56.  GENERAL:  This is a very pleasant 55 year old white male in no acute distress, appearing his stated age.  HEENT:  Head is atraumatic, normocephalic.  NECK:  Supple without masses.  CHEST:  Clear to auscultation bilaterally.  No wheezing or rhonchi.  BREASTS:  Deferred, not pertinent to present illness.  CARDIAC:  Regular rate and rhythm, S1, S2.  ABDOMEN:  Soft and nontender.  Bowel sounds are positive.  No guarding or rebound.  GENITOURINARY:  Deferred, not pertinent to present illness.  EXTREMITIES:  Marked restriction of range of motion in both hips with flexion to approximately 85 degrees, full extension, no internal rotation, and 20 degrees external rotation, approximately 10 degrees of abduction and 10 degrees of adduction.  He walks with an antalgic gait with an abductor lurch to the right.  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  IMPRESSION:  History of Legg-Calve-Perthe disease.  PLAN:  The patient will be admitted to [facility] to undergo a right total hip arthroplasty on March 05, 2001, at 3:30 p.m. per Dr. Wynelle Link.  The risks and benefits have been discussed with the patient concerning this.  The patient has also agreed to be a candidate in the _____ trials, where he will be followed appropriately. Dictated by:   Avelina Laine, P.A.-C. Attending:  Gaynelle Arabian, M.D. DD:   02/27/01 TD:  02/27/01 Job: [id number]

## 2010-08-27 NOTE — Op Note (Signed)
[facility]  Patient:    Steve [name], Steve [name] Visit Number: [id number] MRN: [id number]          Service Type: SUR Location: [place] [facility] Attending Physician:  [name] Dictated by:   Gaynelle Arabian, M.D. Proc. Date: 03/05/01 Admit Date:  03/05/2001                             Operative Report  PREOPERATIVE DIAGNOSIS:  Perthes disease with degenerative change, right hip.  POSTOPERATIVE DIAGNOSIS:  Perthes disease with degenerative change, right hip.  OPERATION:  Right total hip arthroplasty.  SURGEON:  Gaynelle Arabian, M.D.  ASSISTANT:  Duncan Dull. Troncale, P.A.C.  ANESTHESIA:  General.  ESTIMATED BLOOD LOSS:  800  DRAIN:  Hemovac times one.  COMPLICATIONS:  None.  CONDITION:  Stable to recovery.  BRIEF CLINICAL NOTE:  Steve [name] is a 55 year old male who has Perthes disease with severe degenerative changes in both hips.  Right is currently more symptomatic than the left. Presents now for right total hip arthroplasty.  DESCRIPTION OF PROCEDURE:  After the successful administration of general anesthetic, the patient was placed in the left lateral decubitus position with the right side up and held with the hip positioner.  Right lower extremity was isolated from his perineum with plastic drapes and prepped and draped in the usual sterile fashion.  A standard posterior lateral incision was made with 10 blade subcutaneous tissue to the level of the fascia lata which was incised in line with the skin incision.  The sciatic nerve was palpated and protected. The short extensor rotator was isolated off the femur.  The capsulectomy is performed.  The head is dislocated.  It is grossly malshaped and retroverted. We put the trial prosthesis such that the center of the trial head corresponds to a level at the tip of the greater trochanter and the osteotomy lines marked on the femoral neck and osteotomy was made with an oscillating saw.  We removed  the over grown head from around the neck.  Femur was then retracted anteriorly.  Anterior capsule removed and acetabular exposure obtained.  He did not have an acetabular defect.  Reamed up to 55 and put a 56 mm pentacle shell and transfixed with single dome screw.  This had a great fit.  I then put the trial 36 mm neutral liner in.  Femur was addressed first with the starter reamer and then the canal was irrigated.  Axial reaming was performed to 13.5 mm.  Proximal remaining up to an 18D and machine sleeve to a large.  An 18D large trial sleeve was placed with an 18 x 13 stem, 36 + 8 neck and 36 + 0 head.  He was retroverted on his native anteversion thus I put him into about 20 degrees of anteversion.  Hip is reduced and there is great stability, full extension and full external rotation, 70 degrees flexion, 40 degrees adduction, 90 degrees internal rotation, and 90 degrees flexion, 70 degrees internal rotation.  We then took the trials out and put the permanent Apex hole eliminator acetabular shell and put the Ultramed metal 36 mm liner.  The 18D large sleeve was placed in the proximal femur, 18 x 13 stem with 36 + 8 neck placed into the femur with 20 degrees of anteversion.  Permanent 36 + 0 head is placed.  The hip is reduced and again great stability.  Wound is copiously irrigated with  antibiotic solution.  Short external rotator is reattached to femur through drill holes. The fascia lata is closed over Hemovac drain with interrupted #1 Vicryl.  The subcutaneous closed in two layers with interrupted #1 and 2-0 Vicryl, subcutaneous running 4-0 Monocryl.  Incision is clean and dry and Steri-Strips and bulky sterile dressing applied.  The patient is awakened and transported to recovery room in stable condition.  Dictated by:   Gaynelle Arabian, M.D.  Attending Physician:  [name] DD:  03/05/01 TD:  03/06/01 Job: [id number]

## 2010-08-31 ENCOUNTER — Telehealth: Payer: Self-pay | Admitting: Internal Medicine

## 2010-08-31 NOTE — Telephone Encounter (Signed)
Pt is wanting to switch PCP's from Dr. Larose Kells to Dr. Birdie Riddle. Ok to switch?

## 2010-09-01 NOTE — Telephone Encounter (Signed)
Ok w/ me 

## 2010-09-01 NOTE — Telephone Encounter (Signed)
Appt has been scheduled.

## 2010-09-02 ENCOUNTER — Ambulatory Visit (INDEPENDENT_AMBULATORY_CARE_PROVIDER_SITE_OTHER)
Admission: RE | Admit: 2010-09-02 | Discharge: 2010-09-02 | Disposition: A | Discharge status: Home / Self Care | Payer: BC Managed Care – PPO | Source: Ambulatory Visit | Attending: Family Medicine | Admitting: Family Medicine

## 2010-09-02 ENCOUNTER — Ambulatory Visit (INDEPENDENT_AMBULATORY_CARE_PROVIDER_SITE_OTHER): Payer: BC Managed Care – PPO | Admitting: Family Medicine

## 2010-09-02 ENCOUNTER — Encounter: Payer: Self-pay | Admitting: Family Medicine

## 2010-09-02 ENCOUNTER — Encounter: Payer: Self-pay | Admitting: *Deleted

## 2010-09-02 VITALS — BP 140/86 | HR 56 | Temp 98.5°F | Wt 204.0 lb

## 2010-09-02 DIAGNOSIS — R109 Unspecified abdominal pain: Secondary | ICD-10-CM

## 2010-09-02 DIAGNOSIS — R1031 Right lower quadrant pain: Secondary | ICD-10-CM | POA: Insufficient documentation

## 2010-09-02 DIAGNOSIS — R030 Elevated blood-pressure reading, without diagnosis of hypertension: Secondary | ICD-10-CM

## 2010-09-02 LAB — HEPATIC FUNCTION PANEL
AST: 20 U/L (ref 0–37)
Alkaline Phosphatase: 44 U/L (ref 39–117)
Total Bilirubin: 1 mg/dL (ref 0.3–1.2)

## 2010-09-02 LAB — BASIC METABOLIC PANEL
BUN: 17 mg/dL (ref 6–23)
CO2: 29 mEq/L (ref 19–32)
Calcium: 9.2 mg/dL (ref 8.4–10.5)
GFR: 95.6 mL/min (ref >60.00)
Glucose, Bld: 101 mg/dL — ABNORMAL HIGH (ref 70–99)

## 2010-09-02 LAB — CBC WITH DIFFERENTIAL/PLATELET
Basophils Relative: 0.5 % (ref 0.0–3.0)
Eosinophils Relative: 1.1 % (ref 0.0–5.0)
Lymphocytes Relative: 37 % (ref 12.0–46.0)
Lymphs Abs: 2.9 10*3/uL (ref 0.7–4.0)
Monocytes Absolute: 0.7 10*3/uL (ref 0.1–1.0)
Neutro Abs: 4.2 10*3/uL (ref 1.4–7.7)
Neutrophils Relative %: 52.8 % (ref 43.0–77.0)
RDW: 13.8 % (ref 11.5–14.6)
WBC: 7.9 10*3/uL (ref 4.5–10.5)

## 2010-09-02 LAB — POCT URINALYSIS DIPSTICK
Nitrite, UA: NEGATIVE
Protein, UA: NEGATIVE
Spec Grav, UA: 1.005
pH, UA: 5

## 2010-09-02 MED ORDER — IOHEXOL 300 MG/ML  SOLN
100.0000 mL | Freq: Once | INTRAMUSCULAR | Status: AC | PRN
  Administered 2010-09-02: 100 mL via INTRAVENOUS

## 2010-09-02 MED ORDER — HYDROCODONE-ACETAMINOPHEN 5-500 MG PO TABS: 1.0000 | ORAL_TABLET | ORAL | Status: DC | PRN

## 2010-09-02 NOTE — Patient Instructions (Signed)
We'll notify you of your lab results and your CT scan Take the Vicodin as needed for severe pain- do not combine w/ tylenol but you can use ibuprofen Call with any questions or concerns Hang in there!

## 2010-09-02 NOTE — Progress Notes (Signed)
  Subjective:    Patient ID: Steve [name], male    DOB: [birth year], 55 y.o.   MRN: [id number]  HPI RLQ pain- sxs started 'a few days ago'.  Had some blood in urine, noted there was 'something in the bottom of the toilet'.  Thinks it was possibly a kidney stone yesterday.  Pain woke him last night.  No nausea or vomiting.  Still w/ good appetite.  Denies blood in urine today.  Pain did radiate to R flank.  No diarrhea- 'BMs have been good'.  Elevated BP- pt reports he has never had a problem w/ BP but has not had this checked recently.  May be related to current pain.  Denies CP, SOB, HAs, visual changes, edema.   Review of Systems For ROS see HPI     Objective:   Physical Exam  Constitutional: He appears well-developed and well-nourished. No distress.  Eyes: Conjunctivae and EOM are normal. Pupils are equal, round, and reactive to light.  Neck: Normal range of motion. Neck supple. No thyromegaly present.  Cardiovascular: Normal rate, regular rhythm, normal heart sounds and intact distal pulses.   No murmur heard. Pulmonary/Chest: Effort normal and breath sounds normal. No respiratory distress. He has no wheezes.  Abdominal: Soft. Bowel sounds are normal. He exhibits no distension and no mass. There is tenderness (RLQ tenderness over McBurney's point). There is no rebound and no guarding.  Lymphadenopathy:    He has no cervical adenopathy.  Skin: Skin is warm and dry.          Assessment & Plan:

## 2010-09-03 ENCOUNTER — Ambulatory Visit: Payer: Self-pay | Admitting: Family Medicine

## 2010-09-07 NOTE — Assessment & Plan Note (Signed)
Given location of pt's pain must consider appendicitis.  Get CT scan which will also be somewhat helpful in identifying possible kidney stone.  Pain meds given, remainder of plan hinges on CT results.  Pt expressed understanding and is in agreement w/ plan.

## 2010-09-07 NOTE — Assessment & Plan Note (Signed)
May be related to pt's pain.  Will follow at upcoming CPE.

## 2011-07-23 ENCOUNTER — Inpatient Hospital Stay (HOSPITAL_COMMUNITY)
Admission: EM | Admit: 2011-07-23 | Discharge: 2011-08-01 | DRG: 477 | Disposition: A | Discharge status: Home / Self Care | Payer: BC Managed Care – PPO | Attending: Internal Medicine | Admitting: Internal Medicine

## 2011-07-23 ENCOUNTER — Emergency Department (HOSPITAL_COMMUNITY): Payer: BC Managed Care – PPO

## 2011-07-23 ENCOUNTER — Encounter (HOSPITAL_COMMUNITY): Payer: Self-pay

## 2011-07-23 DIAGNOSIS — Z96649 Presence of unspecified artificial hip joint: Secondary | ICD-10-CM

## 2011-07-23 DIAGNOSIS — K219 Gastro-esophageal reflux disease without esophagitis: Secondary | ICD-10-CM | POA: Diagnosis present

## 2011-07-23 DIAGNOSIS — K0530 Chronic periodontitis, unspecified: Secondary | ICD-10-CM | POA: Diagnosis present

## 2011-07-23 DIAGNOSIS — R509 Fever, unspecified: Secondary | ICD-10-CM | POA: Diagnosis present

## 2011-07-23 DIAGNOSIS — K083 Retained dental root: Secondary | ICD-10-CM | POA: Diagnosis present

## 2011-07-23 DIAGNOSIS — M60009 Infective myositis, unspecified site: Secondary | ICD-10-CM

## 2011-07-23 DIAGNOSIS — M629 Disorder of muscle, unspecified: Principal | ICD-10-CM | POA: Diagnosis present

## 2011-07-23 DIAGNOSIS — R1031 Right lower quadrant pain: Secondary | ICD-10-CM | POA: Diagnosis present

## 2011-07-23 DIAGNOSIS — R7881 Bacteremia: Secondary | ICD-10-CM

## 2011-07-23 DIAGNOSIS — A4901 Methicillin susceptible Staphylococcus aureus infection, unspecified site: Secondary | ICD-10-CM | POA: Diagnosis present

## 2011-07-23 DIAGNOSIS — K5900 Constipation, unspecified: Secondary | ICD-10-CM | POA: Diagnosis present

## 2011-07-23 DIAGNOSIS — E291 Testicular hypofunction: Secondary | ICD-10-CM | POA: Diagnosis present

## 2011-07-23 DIAGNOSIS — M2420 Disorder of ligament, unspecified site: Principal | ICD-10-CM | POA: Diagnosis present

## 2011-07-23 DIAGNOSIS — K029 Dental caries, unspecified: Secondary | ICD-10-CM | POA: Diagnosis present

## 2011-07-23 DIAGNOSIS — M1990 Unspecified osteoarthritis, unspecified site: Secondary | ICD-10-CM | POA: Diagnosis present

## 2011-07-23 DIAGNOSIS — R7309 Other abnormal glucose: Secondary | ICD-10-CM | POA: Diagnosis present

## 2011-07-23 DIAGNOSIS — K045 Chronic apical periodontitis: Secondary | ICD-10-CM | POA: Diagnosis present

## 2011-07-23 DIAGNOSIS — E739 Lactose intolerance, unspecified: Secondary | ICD-10-CM

## 2011-07-23 DIAGNOSIS — M25559 Pain in unspecified hip: Secondary | ICD-10-CM | POA: Diagnosis present

## 2011-07-23 DIAGNOSIS — G4730 Sleep apnea, unspecified: Secondary | ICD-10-CM | POA: Diagnosis present

## 2011-07-23 HISTORY — DX: Gastro-esophageal reflux disease without esophagitis: K21.9

## 2011-07-23 LAB — DIFFERENTIAL
Basophils Absolute: 0 10*3/uL (ref 0.0–0.1)
Basophils Relative: 0 % (ref 0–1)
Eosinophils Absolute: 0 10*3/uL (ref 0.0–0.7)
Eosinophils Relative: 0 % (ref 0–5)
Lymphs Abs: 1.4 10*3/uL (ref 0.7–4.0)
Monocytes Absolute: 2.3 10*3/uL — ABNORMAL HIGH (ref 0.1–1.0)
Neutrophils Relative %: 84 % — ABNORMAL HIGH (ref 43–77)

## 2011-07-23 LAB — URINALYSIS, ROUTINE W REFLEX MICROSCOPIC
Glucose, UA: NEGATIVE mg/dL
Leukocytes, UA: NEGATIVE
Protein, ur: 30 mg/dL — AB
Specific Gravity, Urine: 1.023 (ref 1.005–1.030)
pH: 6.5 (ref 5.0–8.0)

## 2011-07-23 LAB — BASIC METABOLIC PANEL
BUN: 13 mg/dL (ref 6–23)
CO2: 25 mEq/L (ref 19–32)
Chloride: 96 mEq/L (ref 96–112)
GFR calc Af Amer: >90 mL/min (ref >90)
Potassium: 4.1 mEq/L (ref 3.5–5.1)

## 2011-07-23 LAB — URINE MICROSCOPIC-ADD ON: Urine-Other: NONE SEEN

## 2011-07-23 LAB — CBC
HCT: 40.6 % (ref 39.0–52.0)
MCHC: 34 g/dL (ref 30.0–36.0)
MCV: 87.1 fL (ref 78.0–100.0)
RBC: 4.66 MIL/uL (ref 4.22–5.81)

## 2011-07-23 MED ORDER — ONDANSETRON HCL 4 MG/2ML IJ SOLN
4.0000 mg | Freq: Once | INTRAMUSCULAR | Status: AC
  Administered 2011-07-23: 4 mg via INTRAVENOUS
  Filled 2011-07-23: qty 2

## 2011-07-23 MED ORDER — SODIUM CHLORIDE 0.9 % IV BOLUS (SEPSIS)
1000.0000 mL | Freq: Once | INTRAVENOUS | Status: AC
  Administered 2011-07-23: 1000 mL via INTRAVENOUS

## 2011-07-23 MED ORDER — HYDROMORPHONE HCL PF 1 MG/ML IJ SOLN
1.0000 mg | Freq: Once | INTRAMUSCULAR | Status: AC
  Administered 2011-07-23: 1 mg via INTRAVENOUS
  Filled 2011-07-23: qty 1

## 2011-07-23 MED ORDER — IOHEXOL 300 MG/ML  SOLN
100.0000 mL | Freq: Once | INTRAMUSCULAR | Status: AC | PRN
  Administered 2011-07-23: 100 mL via INTRAVENOUS

## 2011-07-23 NOTE — ED Notes (Signed)
Pt in from home with c/o right hip pain states fell Monday pt has a hx ofbilateral hip replacement pt states pain radiates to the back

## 2011-07-23 NOTE — ED Provider Notes (Signed)
History     CSN: [id number]  Arrival date & time 07/23/11  1600   First MD Initiated Contact with Patient 07/23/11 1753      Chief Complaint  Patient presents with  . Hip Pain    right hip pain    (Consider location/radiation/quality/duration/timing/severity/associated sxs/prior treatment) HPI  Patient who states he has hx of bilateral hip replacement with last replacement in 2002 and no need to see ortho since 2007 until he went to see orthopedic specialist Dr. Elmyra Ricks 3 days ago to evaluate a 4 day hx of gradual onset right anterior "upper hip" pain that he states was gradual onset, persistent and worsening. Patient states that a hip xray was performed that was normal and written a prescription for vicodin that he has been taking but with increasing pain. Patient states he is no longer able to bear weight due to pain with movement and weight bearing in right anterior hip. However when patient is describing pain, he points to RLQ/inguinal region. Patient states he had a fever of 103 at home this week. Denies CP, SOB, n/v/d, dysuria, hematuria or blood in stool but states "my urine has looked dark." Patient states he has hx of "hernia repair" but no other abdominal surgeries. Denies testicular pain, scrotal pain or penile d/c. Patient denies back pain.   Past Medical History  Diagnosis Date  . Sleep apnea     History reviewed. No pertinent past surgical history.  No family history on file.  History  Substance Use Topics  . Smoking status: Never Smoker   . Smokeless tobacco: Not on file  . Alcohol Use: Yes      Review of Systems  All other systems reviewed and are negative.    Allergies  Review of patient's allergies indicates no known allergies.  Home Medications   Current Outpatient Rx  Name Route Sig Dispense Refill  . HYDROCODONE-ACETAMINOPHEN 5-500 MG PO TABS Oral Take 1 tablet by mouth every 4 (four) hours as needed for pain. 20 tablet 0  . IBUPROFEN 400 MG PO  TABS Oral Take 400 mg by mouth every 6 (six) hours as needed. For pain relief      BP 112/91  Pulse 97  Temp(Src) 99.8 F (37.7 C) (Oral)  Resp 18  SpO2 100%  Physical Exam  Nursing note and vitals reviewed. Constitutional: He is oriented to person, place, and time. He appears well-developed and well-nourished. No distress.  HENT:  Head: Normocephalic and atraumatic.  Eyes: Conjunctivae are normal.  Neck: Normal range of motion. Neck supple.  Cardiovascular: Normal rate, regular rhythm, normal heart sounds and intact distal pulses.  Exam reveals no gallop and no friction rub.   No murmur heard. Pulmonary/Chest: Effort normal and breath sounds normal. No respiratory distress. He has no wheezes. He has no rales. He exhibits no tenderness.  Abdominal: Soft. Bowel sounds are normal. He exhibits no distension and no mass. There is tenderness. There is guarding. There is no rebound.       Moderate TTP of RLQ into right inguinal region with guarding but no peritoneal signs or rigidity.   Genitourinary: Penis normal.       No TTP of testes bilaterally with no mass or hernia.   Musculoskeletal: Normal range of motion. He exhibits no edema and no tenderness.       Severe pain in right inguinal region with ROM of right hip with decreased ROM due to pain but no TTP of lateral or posterior hip. No  erythema or skin changes.   No TTP of bilateral thighs.  Good femoral pulses bilaterally. No back TTP.   Neurological: He is alert and oriented to person, place, and time.  Skin: Skin is warm and dry. No rash noted. He is not diaphoretic. No erythema.  Psychiatric: He has a normal mood and affect.    ED Course  Procedures (including critical care time)  IV dilaudid and zofran. IV fluids.   Patient assessed by DR. [employer] Reviewed  URINALYSIS, ROUTINE W REFLEX MICROSCOPIC - Abnormal; Notable for the following:    Color, Urine AMBER (*) BIOCHEMICALS MAY BE AFFECTED BY COLOR   Hgb urine  dipstick MODERATE (*)    Ketones, ur TRACE (*)    Protein, ur 30 (*)    All other components within normal limits  CBC - Abnormal; Notable for the following:    WBC 24.4 (*)    All other components within normal limits  DIFFERENTIAL - Abnormal; Notable for the following:    Neutrophils Relative 84 (*)    Neutro Abs 20.6 (*)    Lymphocytes Relative 6 (*)    Monocytes Absolute 2.3 (*)    All other components within normal limits  URINE MICROSCOPIC-ADD ON  BASIC METABOLIC PANEL   No results found.   No diagnosis found.    MDM  Sign out given to Dr. Justin Mend who will follow pending CT abdomen/pelvis.         Eben Burow, [place] 07/23/11 2031

## 2011-07-24 ENCOUNTER — Encounter (HOSPITAL_COMMUNITY): Payer: Self-pay

## 2011-07-24 LAB — CBC
HCT: 36.2 % — ABNORMAL LOW (ref 39.0–52.0)
MCV: 86.8 fL (ref 78.0–100.0)
Platelets: 202 10*3/uL (ref 150–400)
RBC: 4.17 MIL/uL — ABNORMAL LOW (ref 4.22–5.81)
WBC: 18.8 10*3/uL — ABNORMAL HIGH (ref 4.0–10.5)

## 2011-07-24 LAB — BASIC METABOLIC PANEL
CO2: 24 mEq/L (ref 19–32)
Calcium: 8.3 mg/dL — ABNORMAL LOW (ref 8.4–10.5)
Chloride: 98 mEq/L (ref 96–112)
Sodium: 130 mEq/L — ABNORMAL LOW (ref 135–145)

## 2011-07-24 MED ORDER — VANCOMYCIN HCL IN DEXTROSE 1-5 GM/200ML-% IV SOLN
1000.0000 mg | Freq: Three times a day (TID) | INTRAVENOUS | Status: DC
  Administered 2011-07-24 – 2011-07-25 (×5): 1000 mg via INTRAVENOUS
  Filled 2011-07-24 (×7): qty 200

## 2011-07-24 MED ORDER — SODIUM CHLORIDE 0.9 % IV BOLUS (SEPSIS)
1000.0000 mL | Freq: Once | INTRAVENOUS | Status: AC
  Administered 2011-07-24: 1000 mL via INTRAVENOUS

## 2011-07-24 MED ORDER — SODIUM CHLORIDE 0.9 % IV SOLN
INTRAVENOUS | Status: DC
  Administered 2011-07-24: 100 mL/h via INTRAVENOUS
  Administered 2011-07-24 – 2011-07-28 (×7): via INTRAVENOUS

## 2011-07-24 MED ORDER — OXYCODONE HCL 5 MG PO TABS
5.0000 mg | ORAL_TABLET | ORAL | Status: DC | PRN
  Administered 2011-07-24 – 2011-07-31 (×15): 5 mg via ORAL
  Filled 2011-07-24 (×15): qty 1

## 2011-07-24 MED ORDER — ACETAMINOPHEN 325 MG PO TABS
650.0000 mg | ORAL_TABLET | Freq: Four times a day (QID) | ORAL | Status: DC | PRN
  Administered 2011-07-24 – 2011-07-27 (×9): 650 mg via ORAL
  Filled 2011-07-24 (×9): qty 2

## 2011-07-24 MED ORDER — VANCOMYCIN HCL IN DEXTROSE 1-5 GM/200ML-% IV SOLN
1000.0000 mg | Freq: Once | INTRAVENOUS | Status: AC
  Administered 2011-07-24: 1000 mg via INTRAVENOUS
  Filled 2011-07-24: qty 200

## 2011-07-24 MED ORDER — ONDANSETRON HCL 4 MG PO TABS: 4.0000 mg | ORAL_TABLET | Freq: Four times a day (QID) | ORAL | Status: DC | PRN

## 2011-07-24 MED ORDER — ACETAMINOPHEN 650 MG RE SUPP
650.0000 mg | Freq: Four times a day (QID) | RECTAL | Status: DC | PRN
  Filled 2011-07-24: qty 1

## 2011-07-24 MED ORDER — POLYETHYLENE GLYCOL 3350 17 G PO PACK
17.0000 g | PACK | Freq: Every day | ORAL | Status: DC
  Administered 2011-07-24 – 2011-08-01 (×7): 17 g via ORAL
  Filled 2011-07-24 (×9): qty 1

## 2011-07-24 MED ORDER — HYDROMORPHONE HCL PF 1 MG/ML IJ SOLN
1.0000 mg | Freq: Once | INTRAMUSCULAR | Status: AC
  Administered 2011-07-24: 1 mg via INTRAVENOUS
  Filled 2011-07-24: qty 1

## 2011-07-24 MED ORDER — ZOLPIDEM TARTRATE 5 MG PO TABS
5.0000 mg | ORAL_TABLET | Freq: Every evening | ORAL | Status: DC | PRN
  Administered 2011-07-31: 5 mg via ORAL
  Filled 2011-07-24: qty 1

## 2011-07-24 MED ORDER — HYDROMORPHONE HCL PF 1 MG/ML IJ SOLN
0.5000 mg | INTRAMUSCULAR | Status: DC | PRN
  Administered 2011-07-24 – 2011-07-30 (×29): 1 mg via INTRAVENOUS
  Filled 2011-07-24 (×31): qty 1

## 2011-07-24 MED ORDER — ONDANSETRON HCL 4 MG/2ML IJ SOLN: 4.0000 mg | Freq: Four times a day (QID) | INTRAMUSCULAR | Status: DC | PRN

## 2011-07-24 MED ORDER — DOCUSATE SODIUM 50 MG PO CAPS
50.0000 mg | ORAL_CAPSULE | Freq: Two times a day (BID) | ORAL | Status: DC
  Administered 2011-07-24 – 2011-08-01 (×16): 50 mg via ORAL
  Filled 2011-07-24 (×20): qty 1

## 2011-07-24 MED ORDER — ALUM & MAG HYDROXIDE-SIMETH 200-200-20 MG/5ML PO SUSP: 30.0000 mL | Freq: Four times a day (QID) | ORAL | Status: DC | PRN

## 2011-07-24 NOTE — Progress Notes (Addendum)
Dr. Dyann Kief aware via phone pt running fever of 102.8, bp 110/65, hr 110, and resp.rate 24. No acute distress noted. Tylenol 650mg  given po. See new orders received for blood cx's x2.

## 2011-07-24 NOTE — Progress Notes (Addendum)
ANTIBIOTIC CONSULT NOTE - INITIAL  Pharmacy Consult for Vancomycin Indication: Iliacus Muscle Abscess  No Known Allergies  Patient Measurements: Height: 5' 5.75" (167 cm) IBW/kg (Calculated) : 63.22  Adjusted Body Weight:  Vital Signs: Temp: 99.8 F (37.7 C) (04/13 1624) Temp src: Oral (04/13 1624) BP: 128/74 mmHg (04/13 2345) Pulse Rate: 110  (04/13 2345) Intake/Output from previous day:   Intake/Output from this shift:    Labs:  Basename 07/23/11 2000  WBC 24.4*  HGB 13.8  PLT 214  LABCREA --  CREATININE 0.83   The CrCl is unknown because both a height and weight (above a minimum accepted value) are required for this calculation. No results found for this basename: VANCOTROUGH:2,VANCOPEAK:2,VANCORANDOM:2,GENTTROUGH:2,GENTPEAK:2,GENTRANDOM:2,TOBRATROUGH:2,TOBRAPEAK:2,TOBRARND:2,AMIKACINPEAK:2,AMIKACINTROU:2,AMIKACIN:2, in the last 72 hours   Microbiology: No results found for this or any previous visit (from the past 720 hour(s)).  Medical History: Past Medical History  Diagnosis Date  . Sleep apnea     Medications:  Scheduled:    .  HYDROmorphone (DILAUDID) injection  1 mg Intravenous Once  . HYDROmorphone  1 mg Intravenous Once  . ondansetron  4 mg Intravenous Once  . sodium chloride  1,000 mL Intravenous Once  . vancomycin  1,000 mg Intravenous Once   Infusions:   Assessment:  56 year old male with history of bilateral hip replacement    Complaint of fever at home of 5 F and right hip pain  Likely iliacus muscle abscess; no surgical intervention at this time planned  IV Vancomycin to begin for treatment of suspected abscess  Last known weight = 95.2kg (09/02/10)  Goal of Therapy:  Vancomycin trough level 15-20 mcg/ml  Plan:   Vancomycin 1gm IV x 1 initially ordered  Once patient's actual weight obtained, will order standing regimen  Deon Duer, Toribio Harbour, PharmD 07/24/2011,12:53 AM  ADDENDUM  Ht = 5'6.5" and Wt = 220  pounds  Plan:    Vancomycin 1000mg  IV q8h  Check vancomycin trough level as appropriate  Follow cultures/sensitivities/renal function  Leone Haven, PharmD 07/24/11 @ 01:39

## 2011-07-24 NOTE — ED Provider Notes (Signed)
Medical screening examination/treatment/procedure(s) were conducted as a shared visit with non-physician practitioner(s) and myself.  I personally evaluated the patient during the encounter  C/o fever to 103, Rt hip pain. Worse with walking. Unable to ambulate. Min RLQ/R groin ttp. Severe pain with ROM RLE at the hip. H/o bilateral hip replacement. Found to have iliacus muscle abscess v/s hematoma, likely abscess given clinical history. D/W General surgery--no surgical intervention at this time. IV abx. Vancomycin ordered. Will admit to [facility].  Blair Heys, MD 07/24/11 0030

## 2011-07-24 NOTE — Progress Notes (Signed)
Subjective: Patient is feeling better; currently afebrile. Still with pain on his right hip. Reports some constipation.  Objective: Vital signs in last 24 hours: Temp:  [98.8 F (37.1 C)-100.9 F (38.3 C)] 98.8 F (37.1 C) (04/14 0935) Pulse Rate:  [93-110] 93  (04/14 0935) Resp:  [16-24] 22  (04/14 0935) BP: (112-130)/(57-91) 117/57 mmHg (04/14 0935) SpO2:  [93 %-100 %] 99 % (04/14 0935) Weight:  [93.2 kg (205 lb 7.5 oz)-99.791 kg (220 lb)] 93.2 kg (205 lb 7.5 oz) (04/14 0224) Weight change:  Last BM Date: 07/20/11  Intake/Output from previous day: 04/13 0701 - 04/14 0700 In: -  Out: 850 [Urine:850] Total I/O In: 240 [P.O.:240] Out: 750 [Urine:750]   Physical Exam: General: Alert, awake, oriented x3, in mild acute distress. HEENT: No bruits, no goiter. Heart: Regular rate and rhythm, without murmurs, rubs, gallops. Lungs: Clear to auscultation bilaterally. Abdomen: Soft, nontender, nondistended, positive bowel sounds. Extremities: No clubbing cyanosis or edema with positive pedal pulses. Neuro: Grossly intact, nonfocal.  Lab Results: Basic Metabolic Panel:  Basename 07/24/11 0524 07/23/11 2000  NA 130* 131*  K 3.8 4.1  CL 98 96  CO2 24 25  GLUCOSE 111* 135*  BUN 13 13  CREATININE 0.83 0.83  CALCIUM 8.3* 9.1  MG -- --  PHOS -- --   CBC:  Basename 07/24/11 0524 07/23/11 2000  WBC 18.8* 24.4*  NEUTROABS -- 20.6*  HGB 12.2* 13.8  HCT 36.2* 40.6  MCV 86.8 87.1  PLT 202 214   Urinalysis:  Basename 07/23/11 1912  COLORURINE AMBER*  LABSPEC 1.023  PHURINE 6.5  GLUCOSEU NEGATIVE  HGBUR MODERATE*  BILIRUBINUR NEGATIVE  KETONESUR TRACE*  PROTEINUR 30*  UROBILINOGEN 1.0  NITRITE NEGATIVE  LEUKOCYTESUR NEGATIVE    Studies/Results: Ct Abdomen Pelvis W Contrast  07/23/2011  *RADIOLOGY REPORT*  Clinical Data: Right lower quadrant/groin pain, fever, prior hernia repair, evaluate for appendicitis  CT ABDOMEN AND PELVIS WITH CONTRAST  Technique:   Multidetector CT imaging of the abdomen and pelvis was performed following the standard protocol during bolus administration of intravenous contrast.  Contrast: 191mL OMNIPAQUE IOHEXOL 300 MG/ML  SOLN  Comparison: 09/02/2010  Findings: Mild dependent atelectasis at the lung bases.  Liver, spleen, pancreas, and adrenal glands are within normal limits.  Gallbladder is unremarkable.  No intrahepatic or extrahepatic ductal dilatation.  Kidneys are within normal limits.  No hydronephrosis.  No evidence of bowel obstruction.  Normal appendix.  Colonic diverticulosis, without associated inflammatory changes.  No evidence of abdominal aortic aneurysm.  No abdominopelvic ascites.  No suspicious abdominopelvic lymphadenopathy.  Prostate and bladder are partially obscured by streak artifact from the patient's bilateral hip prostheses.  Tiny fat-containing level hernia.  Suspected postsurgical changes in the right inguinal region.  Enlargement of the right iliacus muscle with associated 1.5 x 2.3 cm intramuscular hypodensity (series 2/image 66) and mild right retroperitoneal stranding (series 2/image 59).  Degenerative changes of the visualized thoracolumbar spine. Bilateral hip arthroplasties.  IMPRESSION: Enlargement of the right iliacus muscle with associated 1.5 x 2.3 intramuscular hypodensity and mild right retroperitoneal stranding. This appearance suggests a retroperitoneal hematoma or less likely intramuscular infection/abscess.  Normal appendix.  No evidence of bowel obstruction.  Original Report Authenticated By: Julian Hy, M.D.    Medications: Scheduled Meds:   . docusate sodium  50 mg Oral BID  .  HYDROmorphone (DILAUDID) injection  1 mg Intravenous Once  . HYDROmorphone  1 mg Intravenous Once  . ondansetron  4 mg Intravenous Once  .  polyethylene glycol  17 g Oral Daily  . sodium chloride  1,000 mL Intravenous Once  . sodium chloride  1,000 mL Intravenous Once  . vancomycin  1,000 mg Intravenous  Once  . vancomycin  1,000 mg Intravenous Q8H   Continuous Infusions:   . sodium chloride 100 mL/hr at 07/24/11 0251   PRN Meds:.acetaminophen, acetaminophen, alum & mag hydroxide-simeth, HYDROmorphone, iohexol, ondansetron (ZOFRAN) IV, ondansetron, oxyCODONE, zolpidem  Assessment/Plan: 1-Right iliacus myositis/abscess: at this point per Gen surgery recommendations, no surgery indicated due to the size of abscess. Will continue treatment with Iv antibiotics (vancomycin) and will follow results. Will get PT/OT consult.  2-GERD: continue PPI  3-Fever: due to # 1; continue antibiotics and antipyretics.  4-OA: continue PRN pain meds.  5-Constipation due to pain meds; will start bowel regimen  6-DVT: SCD's     LOS: 1 day   Favour Aleshire [facility] [phone]  07/24/2011, 1:09 PM

## 2011-07-24 NOTE — H&P (Addendum)
DATE OF ADMISSION:  07/24/2011  PCP:    Kathlene November, MD, MD   Chief Complaint:  Painful Right Hip   HPI: Steve [name] is an 56 y.o. male who has compliant of 4 days of worsening pain in the right hip.  He reports during the past 24 hours he was unable to bear weight on his right leg and had difficulty walking due to pain. He denies fevers or chills. He has a history of a THR in the remote past, and he denies any trauma.  In the ED, a CT scan was done and revealed an abscess of the [place] muscle.  He was placed on IV Vancomycin and referred for admission.   General Surgery On call had been contacted by the EDP and reviewed the CT scan, and the size of the Abscess was nonsurgical at this time.     Past Medical History  Diagnosis Date  . Sleep apnea     cpap not available  . GERD (gastroesophageal reflux disease)     alot of reflux    Past Surgical History  Procedure Date  . Hernia repair     inguinal hernia x3  . Joint replacement     bilateral hip replacement    Medications:  HOME MEDS: Prior to Admission medications   Medication Sig Start Date End Date Taking? Authorizing Provider  HYDROcodone-acetaminophen (VICODIN) 5-500 MG per tablet Take 1 tablet by mouth every 4 (four) hours as needed for pain. 09/02/10 09/02/11 Yes Midge Minium, MD  ibuprofen (ADVIL,MOTRIN) 400 MG tablet Take 400 mg by mouth every 6 (six) hours as needed. For pain relief   Yes Historical Provider, MD    Allergies:  No Known Allergies  Social History:   reports that he has never smoked. He does not have any smokeless tobacco history on file. He reports that he drinks about .6 ounces of alcohol per week. He reports that he does not use illicit drugs.  Family History: Family History  Problem Relation Age of Onset  . Melanoma Mother     Review of Systems:  The patient denies anorexia, fever, weight loss,, vision loss, decreased hearing, hoarseness, chest pain, syncope, dyspnea on exertion,  peripheral edema, balance deficits, hemoptysis, abdominal pain, melena, hematochezia, severe indigestion/heartburn, hematuria, incontinence, genital sores, muscle weakness, suspicious skin lesions, transient blindness, difficulty walking, depression, unusual weight change, abnormal bleeding, enlarged lymph nodes, angioedema, and breast masses.   Physical Exam:  GEN:  Pleasant examined  and in no acute distress; cooperative with exam Filed Vitals:   07/24/11 0134 07/24/11 0224 07/24/11 0408 07/24/11 0935  BP:  121/71 119/64 117/57  Pulse:  109 95 93  Temp:  100.9 F (38.3 C) 99.8 F (37.7 C) 98.8 F (37.1 C)  TempSrc:  Oral Oral Oral  Resp:  24 22 22   Height: 5' 6.5" (1.689 m) 5' 6.5" (1.689 m)    Weight: 99.791 kg (220 lb) 93.2 kg (205 lb 7.5 oz)    SpO2:  93% 97% 99%   Blood pressure 117/57, pulse 93, temperature 98.8 F (37.1 C), temperature source Oral, resp. rate 22, height 5' 6.5" (1.689 m), weight 93.2 kg (205 lb 7.5 oz), SpO2 99.00%. PSYCH: He is alert and oriented x4; does not appear anxious does not appear depressed; affect is normal HEENT: Normocephalic and Atraumatic, Mucous membranes pink; PERRLA; EOM intact; Fundi:  Benign;  No scleral icterus, Nares: Patent, Oropharynx: Clear, Poor Dentition, Neck:  FROM, no cervical lymphadenopathy nor thyromegaly or  carotid bruit; no JVD; Breasts:: Not examined CHEST WALL: No tenderness CHEST: Normal respiration, clear to auscultation bilaterally HEART: Regular rate and rhythm; no murmurs rubs or gallops BACK: No kyphosis or scoliosis; no CVA tenderness ABDOMEN: Positive Bowel Sounds,  Obese, soft non-tender; no masses, no organomegaly, no pannus; no intertriginous candida. Rectal Exam: Not done EXTREMITIES: No bone or joint deformity; age-appropriate arthropathy of the hands and knees; no cyanosis, clubbing or edema; no ulcerations. Genitalia: not examined PULSES: 2+ and symmetric SKIN: Normal hydration no rash or ulceration CNS:  Cranial nerves 2-12 grossly intact no focal neurologic deficit   Labs & Imaging Results for orders placed during the hospital encounter of 07/23/11 (from the past 48 hour(s))  URINALYSIS, ROUTINE W REFLEX MICROSCOPIC     Status: Abnormal   Collection Time   07/23/11  7:12 PM      Component Value Range Comment   Color, Urine AMBER (*) YELLOW  BIOCHEMICALS MAY BE AFFECTED BY COLOR   APPearance CLEAR  CLEAR     Specific Gravity, Urine 1.023  1.005 - 1.030     pH 6.5  5.0 - 8.0     Glucose, UA NEGATIVE  NEGATIVE (mg/dL)    Hgb urine dipstick MODERATE (*) NEGATIVE     Bilirubin Urine NEGATIVE  NEGATIVE     Ketones, ur TRACE (*) NEGATIVE (mg/dL)    Protein, ur 30 (*) NEGATIVE (mg/dL)    Urobilinogen, UA 1.0  0.0 - 1.0 (mg/dL)    Nitrite NEGATIVE  NEGATIVE     Leukocytes, UA NEGATIVE  NEGATIVE    URINE MICROSCOPIC-ADD ON     Status: Normal   Collection Time   07/23/11  7:12 PM      Component Value Range Comment   Urine-Other        Value: NO FORMED ELEMENTS SEEN ON URINE MICROSCOPIC EXAMINATION  CBC     Status: Abnormal   Collection Time   07/23/11  8:00 PM      Component Value Range Comment   WBC 24.4 (*) 4.0 - 10.5 (K/uL)    RBC 4.66  4.22 - 5.81 (MIL/uL)    Hemoglobin 13.8  13.0 - 17.0 (g/dL)    HCT 40.6  39.0 - 52.0 (%)    MCV 87.1  78.0 - 100.0 (fL)    MCH 29.6  26.0 - 34.0 (pg)    MCHC 34.0  30.0 - 36.0 (g/dL)    RDW 14.2  11.5 - 15.5 (%)    Platelets 214  150 - 400 (K/uL)   DIFFERENTIAL     Status: Abnormal   Collection Time   07/23/11  8:00 PM      Component Value Range Comment   Neutrophils Relative 84 (*) 43 - 77 (%)    Neutro Abs 20.6 (*) 1.7 - 7.7 (K/uL)    Lymphocytes Relative 6 (*) 12 - 46 (%)    Lymphs Abs 1.4  0.7 - 4.0 (K/uL)    Monocytes Relative 10  3 - 12 (%)    Monocytes Absolute 2.3 (*) 0.1 - 1.0 (K/uL)    Eosinophils Relative 0  0 - 5 (%)    Eosinophils Absolute 0.0  0.0 - 0.7 (K/uL)    Basophils Relative 0  0 - 1 (%)    Basophils Absolute 0.0  0.0 -  0.1 (K/uL)   BASIC METABOLIC PANEL     Status: Abnormal   Collection Time   07/23/11  8:00 PM      Component  Value Range Comment   Sodium 131 (*) 135 - 145 (mEq/L)    Potassium 4.1  3.5 - 5.1 (mEq/L)    Chloride 96  96 - 112 (mEq/L)    CO2 25  19 - 32 (mEq/L)    Glucose, Bld 135 (*) 70 - 99 (mg/dL)    BUN 13  6 - 23 (mg/dL)    Creatinine, Ser 0.83  0.50 - 1.35 (mg/dL)    Calcium 9.1  8.4 - 10.5 (mg/dL)    GFR calc non Af Amer >90  >90 (mL/min)    GFR calc Af Amer >90  >90 (mL/min)   BASIC METABOLIC PANEL     Status: Abnormal   Collection Time   07/24/11  5:24 AM      Component Value Range Comment   Sodium 130 (*) 135 - 145 (mEq/L)    Potassium 3.8  3.5 - 5.1 (mEq/L)    Chloride 98  96 - 112 (mEq/L)    CO2 24  19 - 32 (mEq/L)    Glucose, Bld 111 (*) 70 - 99 (mg/dL)    BUN 13  6 - 23 (mg/dL)    Creatinine, Ser 0.83  0.50 - 1.35 (mg/dL)    Calcium 8.3 (*) 8.4 - 10.5 (mg/dL)    GFR calc non Af Amer >90  >90 (mL/min)    GFR calc Af Amer >90  >90 (mL/min)   CBC     Status: Abnormal   Collection Time   07/24/11  5:24 AM      Component Value Range Comment   WBC 18.8 (*) 4.0 - 10.5 (K/uL)    RBC 4.17 (*) 4.22 - 5.81 (MIL/uL)    Hemoglobin 12.2 (*) 13.0 - 17.0 (g/dL)    HCT 36.2 (*) 39.0 - 52.0 (%)    MCV 86.8  78.0 - 100.0 (fL)    MCH 29.3  26.0 - 34.0 (pg)    MCHC 33.7  30.0 - 36.0 (g/dL)    RDW 14.1  11.5 - 15.5 (%)    Platelets 202  150 - 400 (K/uL)    Ct Abdomen Pelvis W Contrast  07/23/2011  *RADIOLOGY REPORT*  Clinical Data: Right lower quadrant/groin pain, fever, prior hernia repair, evaluate for appendicitis  CT ABDOMEN AND PELVIS WITH CONTRAST  Technique:  Multidetector CT imaging of the abdomen and pelvis was performed following the standard protocol during bolus administration of intravenous contrast.  Contrast: 12mL OMNIPAQUE IOHEXOL 300 MG/ML  SOLN  Comparison: 09/02/2010  Findings: Mild dependent atelectasis at the lung bases.  Liver, spleen, pancreas, and adrenal  glands are within normal limits.  Gallbladder is unremarkable.  No intrahepatic or extrahepatic ductal dilatation.  Kidneys are within normal limits.  No hydronephrosis.  No evidence of bowel obstruction.  Normal appendix.  Colonic diverticulosis, without associated inflammatory changes.  No evidence of abdominal aortic aneurysm.  No abdominopelvic ascites.  No suspicious abdominopelvic lymphadenopathy.  Prostate and bladder are partially obscured by streak artifact from the patient's bilateral hip prostheses.  Tiny fat-containing level hernia.  Suspected postsurgical changes in the right inguinal region.  Enlargement of the right iliacus muscle with associated 1.5 x 2.3 cm intramuscular hypodensity (series 2/image 66) and mild right retroperitoneal stranding (series 2/image 59).  Degenerative changes of the visualized thoracolumbar spine. Bilateral hip arthroplasties.  IMPRESSION: Enlargement of the right iliacus muscle with associated 1.5 x 2.3 intramuscular hypodensity and mild right retroperitoneal stranding. This appearance suggests a retroperitoneal hematoma or less likely intramuscular infection/abscess.  Normal  appendix.  No evidence of bowel obstruction.  Original Report Authenticated By: Julian Hy, M.D.      Assessment: Abscess of Right Iliacus Muscle on CT scan Osteoarthritis GERD  Plan:  IV Vancomycin  IVFS Pain Control Reconcile meds. DVT prophylaxis Other plans as per orders.    CODE STATUS:      FULL CODE        Mekhia Brogan C 07/24/2011, 10:24 AM

## 2011-07-25 ENCOUNTER — Telehealth: Payer: Self-pay | Admitting: *Deleted

## 2011-07-25 LAB — CBC
HCT: 34.8 % — ABNORMAL LOW (ref 39.0–52.0)
Hemoglobin: 11.8 g/dL — ABNORMAL LOW (ref 13.0–17.0)
MCHC: 33.9 g/dL (ref 30.0–36.0)
RBC: 4.04 MIL/uL — ABNORMAL LOW (ref 4.22–5.81)

## 2011-07-25 LAB — BASIC METABOLIC PANEL
BUN: 15 mg/dL (ref 6–23)
CO2: 25 mEq/L (ref 19–32)
GFR calc non Af Amer: >90 mL/min (ref >90)
Glucose, Bld: 137 mg/dL — ABNORMAL HIGH (ref 70–99)
Potassium: 4 mEq/L (ref 3.5–5.1)

## 2011-07-25 LAB — VANCOMYCIN, TROUGH: Vancomycin Tr: 9.9 ug/mL — ABNORMAL LOW (ref 10.0–20.0)

## 2011-07-25 MED ORDER — VANCOMYCIN HCL 1000 MG IV SOLR
1250.0000 mg | Freq: Three times a day (TID) | INTRAVENOUS | Status: DC
  Administered 2011-07-26 – 2011-07-27 (×5): 1250 mg via INTRAVENOUS
  Filled 2011-07-25 (×8): qty 1250

## 2011-07-25 NOTE — Progress Notes (Signed)
Subjective: Patient is feeling better; but since yesterday afternoon has continue spiking fever (high grade), and also had a positive blood culture for gram positive cocci in clusters.  Objective: Vital signs in last 24 hours: Temp:  [98.7 F (37.1 C)-102.9 F (39.4 C)] 102.9 F (39.4 C) (04/15 2043) Pulse Rate:  [80-99] 99  (04/15 2043) Resp:  [18-24] 18  (04/15 2043) BP: (108-149)/(52-79) 149/75 mmHg (04/15 2043) SpO2:  [95 %-99 %] 95 % (04/15 2043) Weight change:  Last BM Date: 07/20/11  Intake/Output from previous day: 04/14 0701 - 04/15 0700 In: 3463.3 [P.O.:480; I.V.:2383.3; IV Piggyback:600] Out: 2775 [Urine:2775] Total I/O In: -  Out: 500 [Urine:500]   Physical Exam: General: Alert, awake, oriented x3, in no acute distress. HEENT: No bruits, no goiter. Heart: Regular rate and rhythm, without murmurs, rubs, gallops. Lungs: Clear to auscultation bilaterally. Abdomen: Soft, nontender, nondistended, positive bowel sounds. Extremities: No clubbing cyanosis or edema with positive pedal pulses. Neuro: Grossly intact, nonfocal.  Lab Results: Basic Metabolic Panel:  Basename 07/25/11 0458 07/24/11 0524  NA 129* 130*  K 4.0 3.8  CL 97 98  CO2 25 24  GLUCOSE 137* 111*  BUN 15 13  CREATININE 0.97 0.83  CALCIUM 8.2* 8.3*  MG -- --  PHOS -- --   CBC:  Basename 07/25/11 0458 07/24/11 0524 07/23/11 2000  WBC 11.3* 18.8* --  NEUTROABS -- -- 20.6*  HGB 11.8* 12.2* --  HCT 34.8* 36.2* --  MCV 86.1 86.8 --  PLT 188 202 --   Urinalysis:  Basename 07/23/11 1912  COLORURINE AMBER*  LABSPEC 1.023  PHURINE 6.5  GLUCOSEU NEGATIVE  HGBUR MODERATE*  BILIRUBINUR NEGATIVE  KETONESUR TRACE*  PROTEINUR 30*  UROBILINOGEN 1.0  NITRITE NEGATIVE  LEUKOCYTESUR NEGATIVE    Studies/Results: Ct Abdomen Pelvis W Contrast  07/23/2011  *RADIOLOGY REPORT*  Clinical Data: Right lower quadrant/groin pain, fever, prior hernia repair, evaluate for appendicitis  CT ABDOMEN AND  PELVIS WITH CONTRAST  Technique:  Multidetector CT imaging of the abdomen and pelvis was performed following the standard protocol during bolus administration of intravenous contrast.  Contrast: 150mL OMNIPAQUE IOHEXOL 300 MG/ML  SOLN  Comparison: 09/02/2010  Findings: Mild dependent atelectasis at the lung bases.  Liver, spleen, pancreas, and adrenal glands are within normal limits.  Gallbladder is unremarkable.  No intrahepatic or extrahepatic ductal dilatation.  Kidneys are within normal limits.  No hydronephrosis.  No evidence of bowel obstruction.  Normal appendix.  Colonic diverticulosis, without associated inflammatory changes.  No evidence of abdominal aortic aneurysm.  No abdominopelvic ascites.  No suspicious abdominopelvic lymphadenopathy.  Prostate and bladder are partially obscured by streak artifact from the patient's bilateral hip prostheses.  Tiny fat-containing level hernia.  Suspected postsurgical changes in the right inguinal region.  Enlargement of the right iliacus muscle with associated 1.5 x 2.3 cm intramuscular hypodensity (series 2/image 66) and mild right retroperitoneal stranding (series 2/image 59).  Degenerative changes of the visualized thoracolumbar spine. Bilateral hip arthroplasties.  IMPRESSION: Enlargement of the right iliacus muscle with associated 1.5 x 2.3 intramuscular hypodensity and mild right retroperitoneal stranding. This appearance suggests a retroperitoneal hematoma or less likely intramuscular infection/abscess.  Normal appendix.  No evidence of bowel obstruction.  Original Report Authenticated By: Julian Hy, M.D.    Medications: Scheduled Meds:    . docusate sodium  50 mg Oral BID  . polyethylene glycol  17 g Oral Daily  . vancomycin  1,250 mg Intravenous Q8H  . DISCONTD: vancomycin  1,000 mg  Intravenous Q8H   Continuous Infusions:    . sodium chloride 100 mL/hr at 07/25/11 1430   PRN Meds:.acetaminophen, acetaminophen, alum & mag  hydroxide-simeth, HYDROmorphone, ondansetron (ZOFRAN) IV, ondansetron, oxyCODONE, zolpidem  Assessment/Plan: 1-Right iliacus myositis/abscess: at this point per Gen surgery recommendations no surgery indicated due to the size of abscess. Will continue treatment with Iv antibiotics (vancomycin) and supportive care/antipyretics. Will get PT/OT consult once his fever and infections controlled.  2-GPC bacteremia: continue vancomycin; WBC's improving; patient still spiking fever. No murmurs appreciated on exam. Will get a 2-D echo and depending results will decide if TEE needed. Will also consult ID.  3-Fever: due to # 1; continue antibiotics and antipyretics.  4-OA: continue PRN pain meds.  5-Constipation due to pain meds; will continue bowel regimen  6-GERD: continue PPI  7-OSA: will start CPAP.  8-DVT: SCD's     LOS: 2 days   Phil Michels [facility] [phone]  07/25/2011, 9:37 PM

## 2011-07-25 NOTE — Telephone Encounter (Signed)
Pt seen in ED 

## 2011-07-25 NOTE — Progress Notes (Signed)
Kathline Magic NP called to notify of temp of 102.9. Tylenol po given. No new orders at this time

## 2011-07-25 NOTE — Progress Notes (Signed)
ANTIBIOTIC CONSULT NOTE - FOLLOW UP  Pharmacy Consult for Vancomycin Indication: Iliacus myositis/abscess, GPC clusters in blood  No Known Allergies  Patient Measurements: Height: 5' 6.5" (168.9 cm) Weight: 205 lb 7.5 oz (93.2 kg) IBW/kg (Calculated) : 64.95    Vital Signs: Temp: 99.5 F (37.5 C) (04/15 1400) Temp src: Oral (04/15 1400) BP: 108/67 mmHg (04/15 1400) Pulse Rate: 80  (04/15 1400) Intake/Output from previous day: 04/14 0701 - 04/15 0700 In: 3463.3 [P.O.:480; I.V.:2383.3; IV Piggyback:600] Out: 2775 [Urine:2775] Intake/Output from this shift: Total I/O In: 1056.7 [P.O.:240; I.V.:816.7] Out: 925 [Urine:925]  Labs:  [facility] 07/25/11 0458 07/24/11 0524 07/23/11 2000  WBC 11.3* 18.8* 24.4*  HGB 11.8* 12.2* 13.8  PLT 188 202 214  LABCREA -- -- --  CREATININE 0.97 0.83 0.83   Estimated Creatinine Clearance: 92.9 ml/min (by C-G formula based on Cr of 0.97).  87 ml/min (normalized)  Basename 07/25/11 1616  VANCOTROUGH 9.9*  VANCOPEAK --  Jake Michaelis --  GENTTROUGH --  GENTPEAK --  GENTRANDOM --  TOBRATROUGH --  TOBRAPEAK --  TOBRARND --  AMIKACINPEAK --  AMIKACINTROU --  AMIKACIN --     Microbiology: Recent Results (from the past 720 hour(s))  CULTURE, BLOOD (ROUTINE X 2)     Status: Normal (Preliminary result)   Collection Time   07/24/11 12:06 AM      Component Value Range Status Comment   Specimen Description Blood   Final    Special Requests NONE   Final    Culture  Setup Time [id number]   Final    Culture     Final    Value: GRAM POSITIVE COCCI IN CLUSTERS     Note: Gram Stain Report Called to,Read Back By and Verified With: [name] 07/25/11 0850 BY [name]   Report Status PENDING   Incomplete   CULTURE, BLOOD (ROUTINE X 2)     Status: Normal (Preliminary result)   Collection Time   07/24/11 12:22 AM      Component Value Range Status Comment   Specimen Description BLOOD LEFT ARM   Final    Special Requests BOTTLES DRAWN AEROBIC  AND ANAEROBIC  5ML   Final    Culture  Setup Time [id number]   Final    Culture     Final    Value: GRAM POSITIVE COCCI IN CLUSTERS     Note: Gram Stain Report Called to,Read Back By and Verified With: [name] 07/25/11 0850 BY [name]   Report Status PENDING   Incomplete     Anti-infectives     Start     Dose/Rate Route Frequency Ordered Stop   07/24/11 0900   vancomycin (VANCOCIN) IVPB 1000 mg/200 mL premix        1,000 mg 200 mL/hr over 60 Minutes Intravenous Every 8 hours 07/24/11 0140     07/24/11 0100   vancomycin (VANCOCIN) IVPB 1000 mg/200 mL premix        1,000 mg 200 mL/hr over 60 Minutes Intravenous  Once 07/24/11 0052 07/24/11 0226          Assessment:  72 yom on day #2 vancomycin for iliacus myositis/abscess  Also growing GPC clusters in 1 of 2 blood cultures  Vanc trough subtherapeutic 9.9  Febrile, WBC elevated but improving, renal function is stable  Goal of Therapy:  Vancomycin trough level 15-20 mcg/ml until MRSA is ruled out  Plan:   Increase vancomcyin 1250mg  IV q8h  Recheck trough at new steady state  Continue to  follow renal function & cultures  Peggyann Juba, PharmD, BCPS Pager: [phone] 07/25/2011,5:36 PM

## 2011-07-25 NOTE — Telephone Encounter (Signed)
[facility] Triage Call Report Triage Record Num: [id number] Operator: Buford Dresser Patient Name: Steve [name] Call Date & Time: 07/23/2011 1:52:11PM Patient Phone: [phone] PCP: Alda Berthold. Paz Patient Gender: Male PCP Fax : Patient DOB: [birth year] Practice Name: [facility] Reason for Call: Caller: [name]/Spouse; PCP: Kathlene November; CB#: [phone]; Calling about bilat side, groin pain into right hip,urine is very dark orange,chills,fever.Onset 07/21/11.Temp 103.2.All emergent sxs of Urinary Symptoms-Male r/o.Advised to go to UC.Wife does not want to go to an UC but prefers ED. Protocol(s) Used: Urinary Symptoms - Male Recommended Outcome per Protocol: See Provider within 4 hours Reason for Outcome: Urinary tract symptoms AND fever 101.5 F (38.6 C) or higher or vomiting Care Advice: ~ Another adult should drive. ~ Call provider if symptoms worsen or new symptoms develop. Increase intake of fluids. Try to drink 8 oz. (.2 liter) every hour when awake, including unsweetened cranberry juice, unless on restricted fluids for other medical reasons. Take sips of fluid or eat ice chips if nauseated or vomiting. ~ ~ SYMPTOM / CONDITION MANAGEMENT ~ CAUTIONS Analgesic/Antipyretic Advice - NSAIDs: Consider aspirin, ibuprofen, naproxen or ketoprofen for pain or fever as directed on label or by pharmacist/provider. EXCEPTIONS: - Should not be used if taking blood thinners or have bleeding problems. - Do not use if have history of sensitivity/allergy to any of these medications; or history of cardiovascular, ulcer, kidney, liver disease or diabetes unless approved by provider. - Do not exceed recommended dose or frequency. ~ 07/23/2011 2:05:47PM

## 2011-07-25 NOTE — Progress Notes (Signed)
UR complete 

## 2011-07-25 NOTE — Telephone Encounter (Signed)
PCP Dr Birdie Riddle

## 2011-07-25 NOTE — Progress Notes (Signed)
Received call from lab for abnormal lab value, blood culture showed gram positive cocci clusters.  Results text-paged to [facility] MD.  Pt temp 101.8 F, text-paged value to [facility] MD.  Tylenol administered.  Will continue to monitor.

## 2011-07-26 ENCOUNTER — Inpatient Hospital Stay (HOSPITAL_COMMUNITY): Payer: BC Managed Care – PPO

## 2011-07-26 DIAGNOSIS — M629 Disorder of muscle, unspecified: Principal | ICD-10-CM

## 2011-07-26 LAB — HIV ANTIBODY (ROUTINE TESTING W REFLEX): HIV: NONREACTIVE

## 2011-07-26 LAB — BASIC METABOLIC PANEL
CO2: 26 mEq/L (ref 19–32)
Calcium: 8.1 mg/dL — ABNORMAL LOW (ref 8.4–10.5)
GFR calc non Af Amer: >90 mL/min (ref >90)
Glucose, Bld: 122 mg/dL — ABNORMAL HIGH (ref 70–99)
Potassium: 3.5 mEq/L (ref 3.5–5.1)
Sodium: 129 mEq/L — ABNORMAL LOW (ref 135–145)

## 2011-07-26 MED ORDER — RIFAMPIN 300 MG PO CAPS
300.0000 mg | ORAL_CAPSULE | Freq: Every day | ORAL | Status: DC
  Administered 2011-07-26 – 2011-08-01 (×6): 300 mg via ORAL
  Filled 2011-07-26 (×7): qty 1

## 2011-07-26 NOTE — Progress Notes (Signed)
Spoke to RT regarding patient CPAP pt feels like cpap does not fit properly and the right pressure is not in. Contacted RT they will see patient tonight before bed. Levy Sjogren, RN

## 2011-07-26 NOTE — Progress Notes (Signed)
*  PRELIMINARY RESULTS* Echocardiogram 2D Echocardiogram has been performed.  Steve [name] [facility] 07/26/2011, 10:28 AM

## 2011-07-26 NOTE — Progress Notes (Signed)
Subjective: Patient is still spiking fever; No CP, no SOB, no abdominal pain.  Objective: Vital signs in last 24 hours: Temp:  [98.7 F (37.1 C)-102.9 F (39.4 C)] 99.3 F (37.4 C) (04/16 1000) Pulse Rate:  [80-99] 88  (04/16 1000) Resp:  [17-18] 18  (04/16 1000) BP: (107-149)/(68-79) 126/72 mmHg (04/16 1000) SpO2:  [93 %-99 %] 96 % (04/16 1000) Weight change:  Last BM Date: 07/26/11  Intake/Output from previous day: 04/15 0701 - 04/16 0700 In: 1296.7 [P.O.:480; I.V.:816.7] Out: 2525 [Urine:2525] Total I/O In: 1921.7 [I.V.:1921.7] Out: 800 [Urine:800]   Physical Exam: General: Alert, awake, oriented x3, in no acute distress. HEENT: No bruits, no goiter. Heart: Regular rate and rhythm, without murmurs, rubs, gallops. Lungs: Clear to auscultation bilaterally. Abdomen: Soft, nontender, nondistended, positive bowel sounds. Extremities: No clubbing cyanosis or edema with positive pedal pulses. Neuro: Grossly intact, nonfocal.  Lab Results: Basic Metabolic Panel:  Basename 07/26/11 0416 07/25/11 0458  NA 129* 129*  K 3.5 4.0  CL 95* 97  CO2 26 25  GLUCOSE 122* 137*  BUN 10 15  CREATININE 0.80 0.97  CALCIUM 8.1* 8.2*  MG -- --  PHOS -- --   CBC:  Basename 07/25/11 0458 07/24/11 0524 07/23/11 2000  WBC 11.3* 18.8* --  NEUTROABS -- -- 20.6*  HGB 11.8* 12.2* --  HCT 34.8* 36.2* --  MCV 86.1 86.8 --  PLT 188 202 --   Urinalysis:  Basename 07/23/11 1912  COLORURINE AMBER*  LABSPEC 1.023  PHURINE 6.5  GLUCOSEU NEGATIVE  HGBUR MODERATE*  BILIRUBINUR NEGATIVE  KETONESUR TRACE*  PROTEINUR 30*  UROBILINOGEN 1.0  NITRITE NEGATIVE  LEUKOCYTESUR NEGATIVE    Studies/Results: Dg Orthopantogram  07/26/2011  *RADIOLOGY REPORT*  Clinical Data: Staph infection of the right hip.  Question periapical abscess.  ORTHOPANTOGRAM/PANORAMIC  Comparison: None.  Findings: The patient is nearly the edentulous.  Four maxillary teeth on the right are identified.  There are  cavities in each of these teeth.  No definite periapical abscess is identified.  There is an elongated radiodense structure in the expected location of a lower left medial lower incisor which may represent a dental implant. There is periapical lucency about this structure.  No fracture is identified.  IMPRESSION:  1.  Possible left lower incisor dental implant has periapical lucency about it worrisome for abscess. 2.  Cavities are present in all maxillary teeth on the right.  Original Report Authenticated By: Arvid Right. Luther Parody, M.D.    Medications: Scheduled Meds:    . docusate sodium  50 mg Oral BID  . polyethylene glycol  17 g Oral Daily  . rifampin  300 mg Oral Daily  . vancomycin  1,250 mg Intravenous Q8H  . DISCONTD: vancomycin  1,000 mg Intravenous Q8H   Continuous Infusions:    . sodium chloride 50 mL/hr at 07/26/11 0257   PRN Meds:.acetaminophen, acetaminophen, alum & mag hydroxide-simeth, HYDROmorphone, ondansetron (ZOFRAN) IV, ondansetron, oxyCODONE, zolpidem  Assessment/Plan: 1-Right iliacus myositis/abscess: at this point per GS recommendations no surgery indicated due to the size of abscess. Will continue treatment with IV antibiotics (vancomycin) and supportive care/antipyretics. Per ID will add rifampin due to concerns of involvement of his right hip. Will follow recommendations; he might also need ortho consult down the road.  2-GPC bacteremia: continue vancomycin; WBC's improving; patient still spiking fever. No murmurs appreciated on exam. Will follow results of 2-D echo and if negative will get TEE. Per ID recommendations will probably 6 weeks of IV antibiotics; will  wait 07-26-11 blood cx to be negative before placing PICC line.  3-Fever: due to # 1; continue antibiotics and antipyretics.  4-OA: continue PRN pain meds.  5-Constipation due to pain meds; will continue bowel regimen  6-GERD: continue PPI  7-OSA: will continue CPAP.  8-DVT: SCD's  9-Poor  dentition: in order to r/o source of infection orthopantogram recommended and done but pending; patient reports having a lot of pain on his upper gum after questioning, bu his afraid of dentist. If abnormal will need dr. Enrique Sack to see him.     LOS: 3 days   Tyreisha Ungar [facility] [phone]  07/26/2011, 5:37 PM

## 2011-07-26 NOTE — Consult Note (Signed)
Date of Admission:  07/23/2011  Date of Consult:  07/26/2011  Reason for Consult:Staph bacteremia Referring Physician: Dr. Dyann Kief  Impression/Recommendation Staph aureus Bacteremia Abscsess Right THR Would- start rifampin Get TEE if TTE (-). Isolate for MRSA while awaiting sensi Dental eval  Ortho eval? HIV Ab Await repeat BCx (4-16) Check CMP as rifampin is hepatically metabolized.  Comment- concerned that he has MRSA and that he has involvement of his hip.  Rifampin is good for prosthetics and slow growing organisms. Suspect he will need 6 weeks of IV therapy due to the presence and proximity of prosthetic. I discussed the side effects of rifampin and that secretions (tears, saliva, urine) may have red discoloration.   Steve [name] is an 56 y.o. male.  HPI: 56 yo M with hx of Right Total Hip Replacement (THR) in 2001 (or 2002 pt unsure) and L THR 2006.  He comes to hospital 4-14 with 4 days of increasing pain in his R hip to the point that he was unable to bear weight in the last 24 h.  In the ED he was afeb but had a WBC of 24.4. He had a CT of his hip showing "Enlargement of the right iliacus muscle with associated 1.5 x 2.3 intramuscular hypodensity and mild right retroperitoneal stranding."    He was started on vancomycin and eval by surgery (non-operative mgmt). Over the next 24h he continued to have fever, BCx + for GPC clusters.  He denies any hx of trauma to his leg or hip.   Past Medical History  Diagnosis Date  . Sleep apnea     cpap not available  . GERD (gastroesophageal reflux disease)     alot of reflux    Past Surgical History  Procedure Date  . Hernia repair     inguinal hernia x3  . Joint replacement     bilateral hip replacement  ergies:   No Known Allergies  Medications:  Scheduled:   . docusate sodium  50 mg Oral BID  . polyethylene glycol  17 g Oral Daily  . vancomycin  1,250 mg Intravenous Q8H  . DISCONTD: vancomycin  1,000 mg Intravenous  Q8H    Social History:  reports that he has never smoked. He does not have any smokeless tobacco history on file. He reports that he drinks about .6 ounces of alcohol per week. He reports that he does not use illicit drugs.  Family History  Problem Relation Age of Onset  . Melanoma Mother     General ROS: no headaches, has had "floaters", no CP/SOB, nl BM, nl urination, no paresthesias, states he does have weakness in his RLE (attributes to limitation due to pain).   Blood pressure 126/72, pulse 88, temperature 99.3 F (37.4 C), temperature source Oral, resp. rate 18, height 5' 6.5" (1.689 m), weight 93.2 kg (205 lb 7.5 oz), SpO2 96.00%. General appearance: alert, cooperative and no distress Eyes: negative findings: conjunctivae and sclerae normal and pupils equal, round, reactive to light and accomodation, no injection Neck: no adenopathy Lungs: clear to auscultation bilaterally Heart: regular rate and rhythm Abdomen: normal findings: bowel sounds normal and soft, non-tender Extremities: edema none and no nail bed lesions Skin: dry, scaling skin on feet Neurologic: Sensory: normal light touch on soles Pain in hip with abduction of his R leg. No pain with rotation.    Results for orders placed during the hospital encounter of 07/23/11 (from the past 48 hour(s))  CULTURE, BLOOD (ROUTINE X 2)  Status: Normal (Preliminary result)   Collection Time   07/24/11  7:21 PM      Component Value Range Comment   Specimen Description BLOOD LEFT ARM  10 ML IN [facility] BOTTLE      Special Requests NONE      Culture  Setup Time [id number]      Culture        Value:        BLOOD CULTURE RECEIVED NO GROWTH TO DATE CULTURE WILL BE HELD FOR 5 DAYS BEFORE ISSUING A FINAL NEGATIVE REPORT   Report Status PENDING     CBC     Status: Abnormal   Collection Time   07/25/11  4:58 AM      Component Value Range Comment   WBC 11.3 (*) 4.0 - 10.5 (K/uL)    RBC 4.04 (*) 4.22 - 5.81 (MIL/uL)    Hemoglobin  11.8 (*) 13.0 - 17.0 (g/dL)    HCT 34.8 (*) 39.0 - 52.0 (%)    MCV 86.1  78.0 - 100.0 (fL)    MCH 29.2  26.0 - 34.0 (pg)    MCHC 33.9  30.0 - 36.0 (g/dL)    RDW 14.5  11.5 - 15.5 (%)    Platelets 188  150 - 400 (K/uL)   BASIC METABOLIC PANEL     Status: Abnormal   Collection Time   07/25/11  4:58 AM      Component Value Range Comment   Sodium 129 (*) 135 - 145 (mEq/L)    Potassium 4.0  3.5 - 5.1 (mEq/L)    Chloride 97  96 - 112 (mEq/L)    CO2 25  19 - 32 (mEq/L)    Glucose, Bld 137 (*) 70 - 99 (mg/dL)    BUN 15  6 - 23 (mg/dL)    Creatinine, Ser 0.97  0.50 - 1.35 (mg/dL)    Calcium 8.2 (*) 8.4 - 10.5 (mg/dL)    GFR calc non Af Amer >90  >90 (mL/min)    GFR calc Af Amer >90  >90 (mL/min)   VANCOMYCIN, TROUGH     Status: Abnormal   Collection Time   07/25/11  4:16 PM      Component Value Range Comment   Vancomycin Tr 9.9 (*) 10.0 - 20.0 (ug/mL)   BASIC METABOLIC PANEL     Status: Abnormal   Collection Time   07/26/11  4:16 AM      Component Value Range Comment   Sodium 129 (*) 135 - 145 (mEq/L)    Potassium 3.5  3.5 - 5.1 (mEq/L)    Chloride 95 (*) 96 - 112 (mEq/L)    CO2 26  19 - 32 (mEq/L)    Glucose, Bld 122 (*) 70 - 99 (mg/dL)    BUN 10  6 - 23 (mg/dL)    Creatinine, Ser 0.80  0.50 - 1.35 (mg/dL)    Calcium 8.1 (*) 8.4 - 10.5 (mg/dL)    GFR calc non Af Amer >90  >90 (mL/min)    GFR calc Af Amer >90  >90 (mL/min)       Component Value Date/Time   SDES BLOOD LEFT ARM  10 ML IN [facility] BOTTLE 07/24/2011 1921   SPECREQUEST NONE 07/24/2011 1921   CULT        BLOOD CULTURE RECEIVED NO GROWTH TO DATE CULTURE WILL BE HELD FOR 5 DAYS BEFORE ISSUING A FINAL NEGATIVE REPORT 07/24/2011 1921   REPTSTATUS PENDING 07/24/2011 1921   No results found.  Thank you so much for this interesting consult,   [name] [id number] 07/26/2011, 11:22 AM     LOS: 3 days

## 2011-07-27 ENCOUNTER — Encounter (HOSPITAL_COMMUNITY): Payer: Self-pay | Admitting: Dentistry

## 2011-07-27 DIAGNOSIS — B958 Unspecified staphylococcus as the cause of diseases classified elsewhere: Secondary | ICD-10-CM

## 2011-07-27 DIAGNOSIS — R7881 Bacteremia: Secondary | ICD-10-CM

## 2011-07-27 LAB — COMPREHENSIVE METABOLIC PANEL
ALT: 62 U/L — ABNORMAL HIGH (ref 0–53)
AST: 71 U/L — ABNORMAL HIGH (ref 0–37)
Albumin: 2.3 g/dL — ABNORMAL LOW (ref 3.5–5.2)
Alkaline Phosphatase: 101 U/L (ref 39–117)
Calcium: 8.2 mg/dL — ABNORMAL LOW (ref 8.4–10.5)
Potassium: 3.6 mEq/L (ref 3.5–5.1)
Sodium: 134 mEq/L — ABNORMAL LOW (ref 135–145)
Total Protein: 6.3 g/dL (ref 6.0–8.3)

## 2011-07-27 LAB — CULTURE, BLOOD (ROUTINE X 2)
Culture  Setup Time: 201304141135
Culture  Setup Time: 201304141135

## 2011-07-27 LAB — CBC
HCT: 33 % — ABNORMAL LOW (ref 39.0–52.0)
Hemoglobin: 11.2 g/dL — ABNORMAL LOW (ref 13.0–17.0)
MCH: 28.7 pg (ref 26.0–34.0)
MCHC: 33.9 g/dL (ref 30.0–36.0)
MCV: 84.6 fL (ref 78.0–100.0)
RDW: 14.6 % (ref 11.5–15.5)

## 2011-07-27 NOTE — Progress Notes (Signed)
INFECTIOUS DISEASE PROGRESS NOTE  ID: Steve [name] is a 56 y.o. male with  Active Problems:  Staphylococcus aureus bacteremia  Subjective: continued pain  Abtx:  Anti-infectives     Start     Dose/Rate Route Frequency Ordered Stop   07/26/11 1300   rifampin (RIFADIN) capsule 300 mg        300 mg Oral Daily 07/26/11 1146     07/25/11 2359   vancomycin (VANCOCIN) 1,250 mg in sodium chloride 0.9 % 250 mL IVPB        1,250 mg 166.7 mL/hr over 90 Minutes Intravenous Every 8 hours 07/25/11 1743     07/24/11 0900   vancomycin (VANCOCIN) IVPB 1000 mg/200 mL premix  Status:  Discontinued        1,000 mg 200 mL/hr over 60 Minutes Intravenous Every 8 hours 07/24/11 0140 07/25/11 1742   07/24/11 0100   vancomycin (VANCOCIN) IVPB 1000 mg/200 mL premix        1,000 mg 200 mL/hr over 60 Minutes Intravenous  Once 07/24/11 0052 07/24/11 0226          Medications:  Scheduled:   . docusate sodium  50 mg Oral BID  . polyethylene glycol  17 g Oral Daily  . rifampin  300 mg Oral Daily  . DISCONTD: vancomycin  1,250 mg Intravenous Q8H    Objective: Vital signs in last 24 hours: Temp:  [99.8 F (37.7 C)-100.4 F (38 C)] 100.4 F (38 C) (04/17 1431) Pulse Rate:  [80-91] 83  (04/17 1431) Resp:  [16-20] 18  (04/17 1431) BP: (109-129)/(64-66) 126/64 mmHg (04/17 1431) SpO2:  [94 %-95 %] 95 % (04/17 1431)   General appearance: alert, cooperative and no distress Resp: clear to auscultation bilaterally Cardio: regular rate and rhythm GI: normal findings: bowel sounds normal and soft, non-tender  Lab Results  Basename 07/27/11 0430 07/26/11 0416 07/25/11 0458  WBC 16.1* -- 11.3*  HGB 11.2* -- 11.8*  HCT 33.0* -- 34.8*  NA 134* 129* --  K 3.6 3.5 --  CL 98 95* --  CO2 27 26 --  BUN 10 10 --  CREATININE 0.76 0.80 --  GLU -- -- --   Liver Panel  Basename 07/27/11 0430  PROT 6.3  ALBUMIN 2.3*  AST 71*  ALT 62*  ALKPHOS 101  BILITOT 2.1*  BILIDIR --  IBILI --    Sedimentation Rate No results found for this basename: ESRSEDRATE in the last 72 hours C-Reactive Protein No results found for this basename: CRP:2 in the last 72 hours  Microbiology: Recent Results (from the past 240 hour(s))  CULTURE, BLOOD (ROUTINE X 2)     Status: Normal   Collection Time   07/24/11 12:06 AM      Component Value Range Status Comment   Specimen Description Blood   Final    Special Requests NONE   Final    Culture  Setup Time [id number]   Final    Culture     Final    Value: STAPHYLOCOCCUS AUREUS     Note: RIFAMPIN AND GENTAMICIN SHOULD NOT BE USED AS SINGLE DRUGS FOR TREATMENT OF STAPH INFECTIONS.     Note: Gram Stain Report Called to,Read Back By and Verified With: [name] 07/25/11 0850 BY [name]   Report Status 07/27/2011 FINAL   Final    Organism ID, Bacteria STAPHYLOCOCCUS AUREUS   Final   CULTURE, BLOOD (ROUTINE X 2)     Status: Normal   Collection Time  07/24/11 12:22 AM      Component Value Range Status Comment   Specimen Description BLOOD LEFT ARM   Final    Special Requests BOTTLES DRAWN AEROBIC AND ANAEROBIC  5ML   Final    Culture  Setup Time [id number]   Final    Culture     Final    Value: STAPHYLOCOCCUS AUREUS     Note: SUSCEPTIBILITIES PERFORMED ON PREVIOUS CULTURE WITHIN THE LAST 5 DAYS.     Note: Gram Stain Report Called to,Read Back By and Verified With: [name] 07/25/11 0850 BY [name]   Report Status 07/27/2011 FINAL   Final   CULTURE, BLOOD (ROUTINE X 2)     Status: Normal (Preliminary result)   Collection Time   07/24/11  7:21 PM      Component Value Range Status Comment   Specimen Description BLOOD LEFT ARM  10 ML IN [facility] BOTTLE   Final    Special Requests NONE   Final    Culture  Setup Time [id number]   Final    Culture     Final    Value:        BLOOD CULTURE RECEIVED NO GROWTH TO DATE CULTURE WILL BE HELD FOR 5 DAYS BEFORE ISSUING A FINAL NEGATIVE REPORT   Report Status PENDING   Incomplete      Studies/Results: Dg Orthopantogram  07/26/2011  *RADIOLOGY REPORT*  Clinical Data: Staph infection of the right hip.  Question periapical abscess.  ORTHOPANTOGRAM/PANORAMIC  Comparison: None.  Findings: The patient is nearly the edentulous.  Four maxillary teeth on the right are identified.  There are cavities in each of these teeth.  No definite periapical abscess is identified.  There is an elongated radiodense structure in the expected location of a lower left medial lower incisor which may represent a dental implant. There is periapical lucency about this structure.  No fracture is identified.  IMPRESSION:  1.  Possible left lower incisor dental implant has periapical lucency about it worrisome for abscess. 2.  Cavities are present in all maxillary teeth on the right.  Original Report Authenticated By: Arvid Right. Luther Parody, M.D.     Assessment/Plan: MSSA bacteremia Iliacus abscess R THR Poor dentition Day  4 vanco/rifampin TTE (-) Will d/c isolation.  Change vanco to ancef Recheck BCx For TEE States he is set for OR in AM, that he has been seen by ortho.  My great appreciation to Dr Enrique Sack, hospitalits  [name] Infectious Diseases [id number] 07/27/2011, 2:36 PM   LOS: 4 days

## 2011-07-27 NOTE — Consult Note (Signed)
DENTAL CONSULTATION  Date of Consultation:  07/27/2011 Patient Name:   Steve [name] Date of Birth:   [birth year] Medical Record Number: [id number]  VITALS: BP 109/66  Pulse 80  Temp(Src) 99.8 F (37.7 C) (Oral)  Resp 20  Ht 5' 6.5" (1.689 m)  Wt 205 lb 7.5 oz (93.2 kg)  BMI 32.67 kg/m2  SpO2 95%   CHIEF COMPLAINT: Patient referred for evaluation of poor dentition with history of staphylococcal bacteremia.  HPI: Steve [name] is 56 year old male referred Dr. Johnnye Sima for a dental consultation. Patient with recent diagnosis of staphylococcal aureus bacteremia. Patient currently on antibiotic therapy per infectious disease. Dental consultation requested to rule out possible dental etiology for the infection.  Patient currently denies acute toothache, swellings, or abscesses. Patient has not seen a dentist since 1991 by report.  Patient obviously is not to take regular dental care. Patient does not have partial dentures by report. Patient was planning and treatment earlier in the air before he had some economic hardship. Patient is interested in having all remaining teeth extracted at this time.   Patient Active Problem List  Diagnoses  . HYPOGONADISM, MALE  . IMPAIRED GLUCOSE TOLERANCE  . SLEEP APNEA  . Abdominal pain, RLQ  . Elevated BP  . Staphylococcus aureus bacteremia    PMH: Past Medical History  Diagnosis Date  . Sleep apnea     cpap not available  . GERD (gastroesophageal reflux disease)     alot of reflux    PSH: Past Surgical History  Procedure Date  . Hernia repair     inguinal hernia x3  . Joint replacement     bilateral hip replacement    ALLERGIES: No Known Allergies  MEDICATIONS: Current Facility-Administered Medications  Medication Dose Route Frequency Provider Last Rate Last Dose  . 0.9 %  sodium chloride infusion   Intravenous Continuous Barton Dubois, MD 50 mL/hr at 07/26/11 2209    . acetaminophen (TYLENOL) tablet 650 mg  650 mg Oral  Q6H PRN Theressa Millard, MD   650 mg at 07/26/11 2208   Or  . acetaminophen (TYLENOL) suppository 650 mg  650 mg Rectal Q6H PRN Theressa Millard, MD      . alum & mag hydroxide-simeth (MAALOX/MYLANTA) 200-200-20 MG/5ML suspension 30 mL  30 mL Oral Q6H PRN Theressa Millard, MD      . docusate sodium (COLACE) capsule 50 mg  50 mg Oral BID Barton Dubois, MD   50 mg at 07/26/11 2109  . HYDROmorphone (DILAUDID) injection 0.5-1 mg  0.5-1 mg Intravenous Q3H PRN Theressa Millard, MD   1 mg at 07/27/11 0709  . ondansetron (ZOFRAN) tablet 4 mg  4 mg Oral Q6H PRN Theressa Millard, MD       Or  . ondansetron (ZOFRAN) injection 4 mg  4 mg Intravenous Q6H PRN Theressa Millard, MD      . oxyCODONE (Oxy IR/ROXICODONE) immediate release tablet 5 mg  5 mg Oral Q4H PRN Theressa Millard, MD   5 mg at 07/26/11 2208  . polyethylene glycol (MIRALAX / GLYCOLAX) packet 17 g  17 g Oral Daily Barton Dubois, MD   17 g at 07/25/11 1034  . rifampin (RIFADIN) capsule 300 mg  300 mg Oral Daily Campbell Riches, MD   300 mg at 07/26/11 1257  . vancomycin (VANCOCIN) 1,250 mg in sodium chloride 0.9 % 250 mL IVPB  1,250 mg Intravenous Q8H Emiliano Dyer, PHARMD  1,250 mg at 07/27/11 0806  . zolpidem (AMBIEN) tablet 5 mg  5 mg Oral QHS PRN Theressa Millard, MD        LABS: Lab Results  Component Value Date   WBC 16.1* 07/27/2011   HGB 11.2* 07/27/2011   HCT 33.0* 07/27/2011   MCV 84.6 07/27/2011   PLT 244 07/27/2011      Component Value Date/Time   NA 134* 07/27/2011 0430   K 3.6 07/27/2011 0430   CL 98 07/27/2011 0430   CO2 27 07/27/2011 0430   GLUCOSE 131* 07/27/2011 0430   BUN 10 07/27/2011 0430   CREATININE 0.76 07/27/2011 0430   CALCIUM 8.2* 07/27/2011 0430   GFRNONAA >90 07/27/2011 0430   GFRAA >90 07/27/2011 0430   No results found for this basename: INR, PROTIME   No results found for this basename: PTT    SOCIAL HISTORY: History   Social History  . Marital Status: Married    Spouse Name:  N/A    Number of Children: N/A  . Years of Education: N/A   Occupational History  . Not on file.   Social History Main Topics  . Smoking status: Never Smoker   . Smokeless tobacco: Not on file  . Alcohol Use: 0.6 oz/week    1 Cans of beer per week  . Drug Use: No  . Sexually Active: Yes   Other Topics Concern  . Not on file   Social History Narrative  . No narrative on file    FAMILY HISTORY: Family History  Problem Relation Age of Onset  . Melanoma Mother      REVIEW OF SYSTEMS: Reviewed from echart for this admission.  DENTAL HISTORY: CHIEF COMPLAINT: Patient referred for evaluation of poor dentition with history of staphylococcal bacteremia.  HPI: Steve [name] is 56 year old male referred Dr. Johnnye Sima for a dental consultation. Patient with recent diagnosis of staphylococcal aureus bacteremia. Patient currently on antibiotic therapy per infectious disease. Dental consultation requested to rule out possible dental etiology for the infection.  Patient currently denies acute toothache, swellings, or abscesses. Patient has not seen a dentist since 1991 by report.  Patient obviously is not to take regular dental care. Patient does not have partial dentures by report. Patient was planning and treatment earlier in the air before he had some economic hardship. Patient is interested in having all remaining teeth extracted at this time.   DENTAL EXAMINATION:  GENERAL: Patient is a well-developed, well-nourished male in no acute distress. HEAD AND NECK: There is no obvious palpable submandibular lymphadenopathy. The patient denies acute TMJ symptoms. INTRAORAL EXAM: Patient has normal saliva. I do not see any evidence of intraoral abscesses. DENTITION: Multiple missing teeth. Patient with multiple retained root segments. PERIODONTAL: Patient with chronic advanced periodontal disease with plaque and calculus accumulations, generalized interval recession and generalized  tooth mobility of remaining teeth. DENTAL CARIES/SUBOPTIMAL RESTORATIONS: Multiple dental caries affecting remaining teeth. ENDODONTIC: The patient currently denies acute pulpitis symptoms. Patient does appear to have multiple areas of periapical pathology and radiolucency. CROWN AND BRIDGE: There are no crown restorations noted. There are no implants noted. PROSTHODONTIC: The patient denies presents a partial dentures. OCCLUSION: Patient with a poor occlusal scheme secondary to multiple missing teeth, multiple retained root segments, and supra-eruption and drifting of the unopposed teeth into the edentulous areas.  RADIOGRAPHIC INTERPRETATION: A panoramic x-ray was obtained on 07/26/2011. There are multiple missing teeth. There are multiple retained root segments. There is supra-eruption and drifting  of the unopposed teeth into the edentulous areas. There are multiple areas of periapical radiolucency in pathology. Dental caries are noted. Atrophy of the edentulous alveolar ridges are noted. There are no implants noted.  ASSESSMENTS: 1. Chronic apical periodontitis 2. Chronic periodontitis 3. Gingival recession 4. Tooth mobility 5. Dental caries 6. Multiple missing teeth 7. Multiple retained root segments 8. Supra-eruption and drifting of the unopposed teeth into the edentulous areas 9. Poor occlusal scheme and malocclusion 10. No history of partial dentures 11. Atrophy of the edentulous alveolar ridges 12. History of oral neglect-primarily due to the economic problems.   PLAN/RECOMMENDATIONS: 1. I discussed the risks, benefits, and complications of various treatment options with the patient in relationship to the medical and dental conditions. We discussed various treatment options to include no treatment, multiple extractions with alveoloplasty, pre-prosthetic surgery as indicated, periodontal therapy, dental restorations, root canal therapy, crown and bridge therapy, implant therapy,  and replacement of missing teeth as indicated. The patient currently wishes to proceed with multiple extractions with alveoloplasty and pre-prosthetic surgery as indicated in the operating room on July 28, 2011 at 11:30 AM. Patient will then followup with a dentist of his choice for fabrication of upper and lower complete dentures after adequate healing.   2. Discussion of findings with medical team and coordination of future medical and dental care.  Lenn Cal, DDS

## 2011-07-27 NOTE — Progress Notes (Signed)
Spoke with patient at bedside. States lives at home with spouse but plans to stay with family as his wife works during the day. Discussed anticipation of long term abx and need for [facility] services. Patient agreeable, provided him with list of [facility] agencies for choice. Will f/u tomorrow, provided with my contact information. Patient continues to have severe pain, states he is unable to ambulate at this time. Awaiting TEE and full tooth extraction as well as placement of PICC. Will continue to follow for d/c needs.

## 2011-07-27 NOTE — Anesthesia Preprocedure Evaluation (Addendum)
Anesthesia Evaluation  Patient identified by MRN, date of birth, ID band Patient awake    Reviewed: Allergy & Precautions, H&P , NPO status , Patient's Chart, lab work & pertinent test results  Airway Mallampati: II TM Distance: >3 FB Neck ROM: Full    Dental No notable dental hx. (+) Chipped and Poor Dentition   Pulmonary sleep apnea ,  breath sounds clear to auscultation  Pulmonary exam normal       Cardiovascular negative cardio ROS  Rhythm:Regular Rate:Normal     Neuro/Psych negative neurological ROS  negative psych ROS   GI/Hepatic Neg liver ROS, hiatal hernia, GERD-  ,  Endo/Other  negative endocrine ROS  Renal/GU negative Renal ROS  negative genitourinary   Musculoskeletal negative musculoskeletal ROS (+)   Abdominal   Peds negative pediatric ROS (+)  Hematology negative hematology ROS (+)   Anesthesia Other Findings   Reproductive/Obstetrics negative OB ROS                          Anesthesia Physical Anesthesia Plan  ASA: II  Anesthesia Plan: General   Post-op Pain Management:    Induction: Intravenous  Airway Management Planned: Nasal ETT  Additional Equipment:   Intra-op Plan:   Post-operative Plan: Extubation in OR  Informed Consent: I have reviewed the patients History and Physical, chart, labs and discussed the procedure including the risks, benefits and alternatives for the proposed anesthesia with the patient or authorized representative who has indicated his/her understanding and acceptance.     Plan Discussed with: CRNA  Anesthesia Plan Comments:         Anesthesia Quick Evaluation

## 2011-07-27 NOTE — Progress Notes (Signed)
Subjective: States he feels pretty good today. I spoke with Dr. Dorothyann Gibbs who plans to do dental procedures tomorrow afternoon at approximately 3:00. I have also arranged for the patient had a TEE for tomorrow  the morning Objective: Filed Vitals:   07/26/11 1000 07/26/11 2020 07/26/11 2142 07/27/11 0550  BP: 126/72  129/66 109/66  Pulse: 88 82 91 80  Temp: 99.3 F (37.4 C)  99.9 F (37.7 C) 99.8 F (37.7 C)  TempSrc: Oral  Oral Oral  Resp: 18 16 18 20   Height:      Weight:      SpO2: 96% 95% 94% 95%   Weight change:   Intake/Output Summary (Last 24 hours) at 07/27/11 1101 Last data filed at 07/27/11 1029  Gross per 24 hour  Intake 2879.67 ml  Output   3475 ml  Net -595.33 ml    General: Alert, awake, oriented x3, in no acute distress.  HEENT: [place]/AT PEERL, EOMI Neck: Trachea midline,  no masses, no thyromegal,y no JVD, no carotid bruit OROPHARYNX:  Moist, No exudate/ erythema/lesions.  Heart: Regular rate and rhythm.  Lungs: Clear to auscultation, no wheezing or rhonchi noted. No increased vocal fremitus resonant to percussion  Abdomen: Soft, nontender, nondistended, positive bowel sounds, no masses no hepatosplenomegaly noted..  Neuro: No focal neurological deficits noted cranial nerves II through XII grossly intact. DTRs 2+ bilaterally upper and lower extremities. Strength functional in bilateral upper and left lower extremity. Musculoskeletal: No warm swelling or erythema around joints, no spinal tenderness noted. Psychiatric: Patient alert and oriented x3, good insight and cognition, good recent to remote recall. Lymph node survey: No cervical axillary or inguinal lymphadenopathy noted.     Lab Results:  [facility] 07/27/11 0430 07/26/11 0416  NA 134* 129*  K 3.6 3.5  CL 98 95*  CO2 27 26  GLUCOSE 131* 122*  BUN 10 10  CREATININE 0.76 0.80  CALCIUM 8.2* 8.1*  MG -- --  PHOS -- --    Basename 07/27/11 0430  AST 71*  ALT 62*  ALKPHOS 101  BILITOT 2.1*    PROT 6.3  ALBUMIN 2.3*   No results found for this basename: LIPASE:2,AMYLASE:2 in the last 72 hours  Basename 07/27/11 0430 07/25/11 0458  WBC 16.1* 11.3*  NEUTROABS -- --  HGB 11.2* 11.8*  HCT 33.0* 34.8*  MCV 84.6 86.1  PLT 244 188   No results found for this basename: CKTOTAL:3,CKMB:3,CKMBINDEX:3,TROPONINI:3 in the last 72 hours No components found with this basename: POCBNP:3 No results found for this basename: DDIMER:2 in the last 72 hours No results found for this basename: HGBA1C:2 in the last 72 hours No results found for this basename: CHOL:2,HDL:2,LDLCALC:2,TRIG:2,CHOLHDL:2,LDLDIRECT:2 in the last 72 hours No results found for this basename: TSH,T4TOTAL,FREET3,T3FREE,THYROIDAB in the last 72 hours No results found for this basename: VITAMINB12:2,FOLATE:2,FERRITIN:2,TIBC:2,IRON:2,RETICCTPCT:2 in the last 72 hours  Micro Results: Recent Results (from the past 240 hour(s))  CULTURE, BLOOD (ROUTINE X 2)     Status: Normal   Collection Time   07/24/11 12:06 AM      Component Value Range Status Comment   Specimen Description Blood   Final    Special Requests NONE   Final    Culture  Setup Time [id number]   Final    Culture     Final    Value: STAPHYLOCOCCUS AUREUS     Note: RIFAMPIN AND GENTAMICIN SHOULD NOT BE USED AS SINGLE DRUGS FOR TREATMENT OF STAPH INFECTIONS.     Note: Gram Stain  Report Called to,Read Back By and Verified With: [name] 07/25/11 0850 BY [name]   Report Status 07/27/2011 FINAL   Final    Organism ID, Bacteria STAPHYLOCOCCUS AUREUS   Final   CULTURE, BLOOD (ROUTINE X 2)     Status: Normal   Collection Time   07/24/11 12:22 AM      Component Value Range Status Comment   Specimen Description BLOOD LEFT ARM   Final    Special Requests BOTTLES DRAWN AEROBIC AND ANAEROBIC  5ML   Final    Culture  Setup Time [id number]   Final    Culture     Final    Value: STAPHYLOCOCCUS AUREUS     Note: SUSCEPTIBILITIES PERFORMED ON PREVIOUS CULTURE WITHIN  THE LAST 5 DAYS.     Note: Gram Stain Report Called to,Read Back By and Verified With: [name] 07/25/11 0850 BY [name]   Report Status 07/27/2011 FINAL   Final   CULTURE, BLOOD (ROUTINE X 2)     Status: Normal (Preliminary result)   Collection Time   07/24/11  7:21 PM      Component Value Range Status Comment   Specimen Description BLOOD LEFT ARM  10 ML IN [facility] BOTTLE   Final    Special Requests NONE   Final    Culture  Setup Time [id number]   Final    Culture     Final    Value:        BLOOD CULTURE RECEIVED NO GROWTH TO DATE CULTURE WILL BE HELD FOR 5 DAYS BEFORE ISSUING A FINAL NEGATIVE REPORT   Report Status PENDING   Incomplete     Studies/Results: Dg Orthopantogram  07/26/2011  *RADIOLOGY REPORT*  Clinical Data: Staph infection of the right hip.  Question periapical abscess.  ORTHOPANTOGRAM/PANORAMIC  Comparison: None.  Findings: The patient is nearly the edentulous.  Four maxillary teeth on the right are identified.  There are cavities in each of these teeth.  No definite periapical abscess is identified.  There is an elongated radiodense structure in the expected location of a lower left medial lower incisor which may represent a dental implant. There is periapical lucency about this structure.  No fracture is identified.  IMPRESSION:  1.  Possible left lower incisor dental implant has periapical lucency about it worrisome for abscess. 2.  Cavities are present in all maxillary teeth on the right.  Original Report Authenticated By: Arvid Right. Luther Parody, M.D.   Ct Abdomen Pelvis W Contrast  07/23/2011  *RADIOLOGY REPORT*  Clinical Data: Right lower quadrant/groin pain, fever, prior hernia repair, evaluate for appendicitis  CT ABDOMEN AND PELVIS WITH CONTRAST  Technique:  Multidetector CT imaging of the abdomen and pelvis was performed following the standard protocol during bolus administration of intravenous contrast.  Contrast: 149mL OMNIPAQUE IOHEXOL 300 MG/ML  SOLN  Comparison:  09/02/2010  Findings: Mild dependent atelectasis at the lung bases.  Liver, spleen, pancreas, and adrenal glands are within normal limits.  Gallbladder is unremarkable.  No intrahepatic or extrahepatic ductal dilatation.  Kidneys are within normal limits.  No hydronephrosis.  No evidence of bowel obstruction.  Normal appendix.  Colonic diverticulosis, without associated inflammatory changes.  No evidence of abdominal aortic aneurysm.  No abdominopelvic ascites.  No suspicious abdominopelvic lymphadenopathy.  Prostate and bladder are partially obscured by streak artifact from the patient's bilateral hip prostheses.  Tiny fat-containing level hernia.  Suspected postsurgical changes in the right inguinal region.  Enlargement of the right iliacus muscle with  associated 1.5 x 2.3 cm intramuscular hypodensity (series 2/image 66) and mild right retroperitoneal stranding (series 2/image 59).  Degenerative changes of the visualized thoracolumbar spine. Bilateral hip arthroplasties.  IMPRESSION: Enlargement of the right iliacus muscle with associated 1.5 x 2.3 intramuscular hypodensity and mild right retroperitoneal stranding. This appearance suggests a retroperitoneal hematoma or less likely intramuscular infection/abscess.  Normal appendix.  No evidence of bowel obstruction.  Original Report Authenticated By: Julian Hy, M.D.    Medications: I have reviewed the patient's current medications. Scheduled Meds:   . docusate sodium  50 mg Oral BID  . polyethylene glycol  17 g Oral Daily  . rifampin  300 mg Oral Daily  . vancomycin  1,250 mg Intravenous Q8H   Continuous Infusions:   . sodium chloride 50 mL/hr at 07/26/11 2209   PRN Meds:.acetaminophen, acetaminophen, alum & mag hydroxide-simeth, HYDROmorphone, ondansetron (ZOFRAN) IV, ondansetron, oxyCODONE, zolpidem Assessment/Plan: Patient Active Hospital Problem List: 1-Right iliacus myositis/abscess: at this point per GS recommendations no surgery  indicated due to the size of abscess. Will continue treatment with IV antibiotics (vancomycin) and supportive care/antipyretics. Per ID will add rifampin due to concerns of involvement of his right hip.    2-GPC bacteremia: continue vancomycin; WBC's elevated again, Tmax- 101.1. No murmurs appreciated on exam.  Results of 2-D echo negative for vegetation. I will get TEE. Per ID recommendations will probably 6 weeks of IV antibiotics; BC from 4/14 shows MSSA. No further BC pending. Will order PICC line to be placed . For TEE on Thursday morning. 3-Fever: due to # 1; Tmax for last 24 hrs 101.1.  Continue antibiotics and antipyretics.   4-OA: continue PRN pain meds.   5-Constipation due to pain meds; will continue bowel regimen   6-GERD: continue PPI   7-OSA: will continue CPAP.   8-DVT: SCD's   9-Poor dentition:Orthopantogram shows possible abscess. Pt to have dental procedure tomorrow afternoon.    LOS: 4 days

## 2011-07-28 ENCOUNTER — Inpatient Hospital Stay (HOSPITAL_COMMUNITY): Payer: BC Managed Care – PPO | Admitting: Anesthesiology

## 2011-07-28 ENCOUNTER — Encounter (HOSPITAL_COMMUNITY): Payer: Self-pay

## 2011-07-28 ENCOUNTER — Encounter (HOSPITAL_COMMUNITY)
Admission: EM | Disposition: A | Discharge status: Home / Self Care | Payer: Self-pay | Source: Home / Self Care | Attending: Internal Medicine

## 2011-07-28 ENCOUNTER — Encounter (HOSPITAL_COMMUNITY): Payer: Self-pay | Admitting: Anesthesiology

## 2011-07-28 DIAGNOSIS — K029 Dental caries, unspecified: Secondary | ICD-10-CM | POA: Diagnosis present

## 2011-07-28 DIAGNOSIS — K0530 Chronic periodontitis, unspecified: Secondary | ICD-10-CM | POA: Diagnosis present

## 2011-07-28 DIAGNOSIS — K045 Chronic apical periodontitis: Secondary | ICD-10-CM

## 2011-07-28 DIAGNOSIS — K083 Retained dental root: Secondary | ICD-10-CM

## 2011-07-28 HISTORY — PX: MULTIPLE EXTRACTIONS WITH ALVEOLOPLASTY: SHX5342

## 2011-07-28 SURGERY — MULTIPLE EXTRACTION WITH ALVEOLOPLASTY
Anesthesia: General | Site: Mouth | Wound class: Clean Contaminated

## 2011-07-28 MED ORDER — NONFORMULARY OR COMPOUNDED ITEM
1.0000 | Status: DC
  Filled 2011-07-28: qty 1

## 2011-07-28 MED ORDER — SODIUM CHLORIDE 0.9 % IR SOLN: 200.0000 mL | Status: DC

## 2011-07-28 MED ORDER — NONFORMULARY OR COMPOUNDED ITEM
1.0000 | Status: DC
  Administered 2011-07-29 – 2011-08-01 (×27): 1 via OROMUCOSAL
  Filled 2011-07-28 (×78): qty 1

## 2011-07-28 MED ORDER — HYDROMORPHONE HCL PF 1 MG/ML IJ SOLN
0.2500 mg | INTRAMUSCULAR | Status: DC | PRN
  Administered 2011-07-28 (×2): 0.5 mg via INTRAVENOUS

## 2011-07-28 MED ORDER — SUCCINYLCHOLINE CHLORIDE 20 MG/ML IJ SOLN
INTRAMUSCULAR | Status: DC | PRN
  Administered 2011-07-28: 100 mg via INTRAVENOUS

## 2011-07-28 MED ORDER — NONFORMULARY OR COMPOUNDED ITEM
1.0000 | Status: DC | PRN
  Filled 2011-07-28: qty 1

## 2011-07-28 MED ORDER — SODIUM CHLORIDE 0.9 % IV SOLN
INTRAVENOUS | Status: DC
  Administered 2011-07-28 – 2011-07-31 (×3): via INTRAVENOUS

## 2011-07-28 MED ORDER — DEXAMETHASONE SODIUM PHOSPHATE 10 MG/ML IJ SOLN
INTRAMUSCULAR | Status: DC | PRN
  Administered 2011-07-28: 10 mg via INTRAVENOUS

## 2011-07-28 MED ORDER — ONDANSETRON HCL 4 MG/2ML IJ SOLN
INTRAMUSCULAR | Status: DC | PRN
  Administered 2011-07-28: 4 mg via INTRAVENOUS

## 2011-07-28 MED ORDER — NEOSTIGMINE METHYLSULFATE 1 MG/ML IJ SOLN
INTRAMUSCULAR | Status: DC | PRN
  Administered 2011-07-28: 3 mg via INTRAVENOUS

## 2011-07-28 MED ORDER — FENTANYL CITRATE 0.05 MG/ML IJ SOLN
INTRAMUSCULAR | Status: DC | PRN
  Administered 2011-07-28 (×4): 50 ug via INTRAVENOUS

## 2011-07-28 MED ORDER — OXYMETAZOLINE HCL 0.05 % NA SOLN
1.0000 | Freq: Two times a day (BID) | NASAL | Status: DC
  Administered 2011-07-28: 1 via NASAL
  Filled 2011-07-28: qty 15

## 2011-07-28 MED ORDER — PROMETHAZINE HCL 25 MG/ML IJ SOLN: 6.2500 mg | INTRAMUSCULAR | Status: DC | PRN

## 2011-07-28 MED ORDER — LACTATED RINGERS IV SOLN
INTRAVENOUS | Status: DC | PRN
  Administered 2011-07-28: 13:00:00 via INTRAVENOUS

## 2011-07-28 MED ORDER — LIDOCAINE-EPINEPHRINE 2 %-1:100000 IJ SOLN
INTRAMUSCULAR | Status: DC | PRN
  Administered 2011-07-28: 6.8 mL

## 2011-07-28 MED ORDER — LACTATED RINGERS IV SOLN: INTRAVENOUS | Status: DC

## 2011-07-28 MED ORDER — LIDOCAINE HCL (CARDIAC) 20 MG/ML IV SOLN
INTRAVENOUS | Status: DC | PRN
  Administered 2011-07-28: 50 mg via INTRAVENOUS

## 2011-07-28 MED ORDER — PROPOFOL 10 MG/ML IV BOLUS
INTRAVENOUS | Status: DC | PRN
  Administered 2011-07-28: 200 mg via INTRAVENOUS

## 2011-07-28 MED ORDER — MIDAZOLAM HCL 5 MG/5ML IJ SOLN
INTRAMUSCULAR | Status: DC | PRN
  Administered 2011-07-28: 2 mg via INTRAVENOUS

## 2011-07-28 MED ORDER — GLYCOPYRROLATE 0.2 MG/ML IJ SOLN
INTRAMUSCULAR | Status: DC | PRN
  Administered 2011-07-28: 0.4 mg via INTRAVENOUS

## 2011-07-28 MED ORDER — CEFAZOLIN SODIUM 1-5 GM-% IV SOLN
INTRAVENOUS | Status: DC | PRN
  Administered 2011-07-28: 2 g via INTRAVENOUS

## 2011-07-28 MED ORDER — BUPIVACAINE-EPINEPHRINE PF 0.5-1:200000 % IJ SOLN
INTRAMUSCULAR | Status: DC | PRN
  Administered 2011-07-28: 1.8 mL

## 2011-07-28 MED ORDER — MEPERIDINE HCL 50 MG/ML IJ SOLN: 6.2500 mg | INTRAMUSCULAR | Status: DC | PRN

## 2011-07-28 MED ORDER — ROCURONIUM BROMIDE 100 MG/10ML IV SOLN
INTRAVENOUS | Status: DC | PRN
  Administered 2011-07-28: 30 mg via INTRAVENOUS

## 2011-07-28 SURGICAL SUPPLY — 24 items
ATTRACTOMAT 16X20 MAGNETIC DRP (DRAPES) ×2 IMPLANT
BAG ZIPLOCK 12X15 (MISCELLANEOUS) ×2 IMPLANT
BLADE SURG 15 STRL LF DISP TIS (BLADE) ×2 IMPLANT
BLADE SURG 15 STRL SS (BLADE) ×2
CLOTH BEACON ORANGE TIMEOUT ST (SAFETY) ×2 IMPLANT
GAUZE SPONGE 4X4 16PLY XRAY LF (GAUZE/BANDAGES/DRESSINGS) ×2 IMPLANT
GLOVE SURG ORTHO 8.0 STRL STRW (GLOVE) ×2 IMPLANT
GLOVE SURG SS PI 6.5 STRL IVOR (GLOVE) ×2 IMPLANT
GOWN STRL REIN XL XLG (GOWN DISPOSABLE) ×4 IMPLANT
KIT BASIN OR (CUSTOM PROCEDURE TRAY) ×2 IMPLANT
NS IRRIG 1000ML POUR BTL (IV SOLUTION) ×2 IMPLANT
PACK EENT SPLIT (PACKS) ×2 IMPLANT
PACKING VAGINAL (PACKING) ×2 IMPLANT
PAD EYE OVAL STERILE LF (GAUZE/BANDAGES/DRESSINGS) IMPLANT
SPONGE GAUZE 4X4 12PLY (GAUZE/BANDAGES/DRESSINGS) ×2 IMPLANT
SUCTION FRAZIER 12FR DISP (SUCTIONS) ×2 IMPLANT
SUT CHROMIC 3 0 PS 2 (SUTURE) ×8 IMPLANT
SUT CHROMIC 4 0 P 3 18 (SUTURE) IMPLANT
SYR 50ML LL SCALE MARK (SYRINGE) ×2 IMPLANT
TOWEL NATURAL 10PK STERILE (DISPOSABLE) ×4 IMPLANT
TUBING CONNECTING 10 (TUBING) ×2 IMPLANT
VESSEL CANN W0 1 W VA 30003 (MISCELLANEOUS) ×2 IMPLANT
WATER STERILE IRR 1500ML POUR (IV SOLUTION) ×2 IMPLANT
YANKAUER SUCT BULB TIP NO VENT (SUCTIONS) ×2 IMPLANT

## 2011-07-28 NOTE — Anesthesia Postprocedure Evaluation (Signed)
  Anesthesia Post-op Note  [name]  Procedure(s) Performed: Procedure(s) (LRB): MULTIPLE EXTRACION WITH ALVEOLOPLASTY (N/A)  Patient Location: PACU  Anesthesia Type: General  Level of Consciousness: awake and alert   Airway and Oxygen Therapy: Patient Spontanous Breathing  Post-op Pain: mild  Post-op Assessment: Post-op Vital signs reviewed, Patient's Cardiovascular Status Stable, Respiratory Function Stable, Patent Airway and No signs of Nausea or vomiting  Post-op Vital Signs: stable  Complications: No apparent anesthesia complications

## 2011-07-28 NOTE — Progress Notes (Signed)
INFECTIOUS DISEASE PROGRESS NOTE  ID: Steve [name] is a 56 y.o. male with Active Problems:  Staphylococcus aureus bacteremia  Subjective: Without complaints. No rashes, no diarrhea, no dysphagia. Hip pain improved.   Abtx:  Anti-infectives     Start     Dose/Rate Route Frequency Ordered Stop   07/26/11 1300   rifampin (RIFADIN) capsule 300 mg        300 mg Oral Daily 07/26/11 1146     07/25/11 2359   vancomycin (VANCOCIN) 1,250 mg in sodium chloride 0.9 % 250 mL IVPB  Status:  Discontinued        1,250 mg 166.7 mL/hr over 90 Minutes Intravenous Every 8 hours 07/25/11 1743 07/27/11 1440   07/24/11 0900   vancomycin (VANCOCIN) IVPB 1000 mg/200 mL premix  Status:  Discontinued        1,000 mg 200 mL/hr over 60 Minutes Intravenous Every 8 hours 07/24/11 0140 07/25/11 1742   07/24/11 0100   vancomycin (VANCOCIN) IVPB 1000 mg/200 mL premix        1,000 mg 200 mL/hr over 60 Minutes Intravenous  Once 07/24/11 0052 07/24/11 0226          Medications:  Scheduled:   . docusate sodium  50 mg Oral BID  . NONFORMULARY OR COMPOUNDED ITEM 1 each  1 each Mouth Rinse Q2H while awake  . polyethylene glycol  17 g Oral Daily  . rifampin  300 mg Oral Daily  . DISCONTD: NONFORMULARY OR COMPOUNDED ITEM 1 each  1 each Mouth Rinse Q2H while awake  . DISCONTD: oxymetazoline  1 spray Each Nare BID  . DISCONTD: sodium chloride irrigation  200 mL Irrigation Q2H while awake  . DISCONTD: sodium chloride irrigation  200 mL Irrigation Q2H while awake    Objective: Vital signs in last 24 hours: Temp:  [97.9 F (36.6 C)-101.1 F (38.4 C)] 98.3 F (36.8 C) (04/18 1556) Pulse Rate:  [73-94] 77  (04/18 1556) Resp:  [18-29] 18  (04/18 1556) BP: (117-131)/(64-83) 131/83 mmHg (04/18 1556) SpO2:  [94 %-99 %] 94 % (04/18 1556)   General appearance: alert, cooperative and no distress Resp: clear to auscultation bilaterally Cardio: regular rate and rhythm and S1, S2 normal GI: normal findings:  bowel sounds normal and soft, non-tender  Lab Results  Basename 07/27/11 0430 07/26/11 0416  WBC 16.1* --  HGB 11.2* --  HCT 33.0* --  NA 134* 129*  K 3.6 3.5  CL 98 95*  CO2 27 26  BUN 10 10  CREATININE 0.76 0.80  GLU -- --   Liver Panel  Basename 07/27/11 0430  PROT 6.3  ALBUMIN 2.3*  AST 71*  ALT 62*  ALKPHOS 101  BILITOT 2.1*  BILIDIR --  IBILI --   Sedimentation Rate No results found for this basename: ESRSEDRATE in the last 72 hours C-Reactive Protein No results found for this basename: CRP:2 in the last 72 hours  Microbiology: Recent Results (from the past 240 hour(s))  CULTURE, BLOOD (ROUTINE X 2)     Status: Normal   Collection Time   07/24/11 12:06 AM      Component Value Range Status Comment   Specimen Description Blood   Final    Special Requests NONE   Final    Culture  Setup Time [id number]   Final    Culture     Final    Value: STAPHYLOCOCCUS AUREUS     Note: RIFAMPIN AND GENTAMICIN SHOULD NOT BE USED AS SINGLE  DRUGS FOR TREATMENT OF STAPH INFECTIONS.     Note: Gram Stain Report Called to,Read Back By and Verified With: [name] 07/25/11 0850 BY [name]   Report Status 07/27/2011 FINAL   Final    Organism ID, Bacteria STAPHYLOCOCCUS AUREUS   Final   CULTURE, BLOOD (ROUTINE X 2)     Status: Normal   Collection Time   07/24/11 12:22 AM      Component Value Range Status Comment   Specimen Description BLOOD LEFT ARM   Final    Special Requests BOTTLES DRAWN AEROBIC AND ANAEROBIC  5ML   Final    Culture  Setup Time [id number]   Final    Culture     Final    Value: STAPHYLOCOCCUS AUREUS     Note: SUSCEPTIBILITIES PERFORMED ON PREVIOUS CULTURE WITHIN THE LAST 5 DAYS.     Note: Gram Stain Report Called to,Read Back By and Verified With: [name] 07/25/11 0850 BY [name]   Report Status 07/27/2011 FINAL   Final   CULTURE, BLOOD (ROUTINE X 2)     Status: Normal (Preliminary result)   Collection Time   07/24/11  7:21 PM      Component  Value Range Status Comment   Specimen Description BLOOD LEFT ARM  10 ML IN [facility] BOTTLE   Final    Special Requests NONE   Final    Culture  Setup Time [id number]   Final    Culture     Final    Value:        BLOOD CULTURE RECEIVED NO GROWTH TO DATE CULTURE WILL BE HELD FOR 5 DAYS BEFORE ISSUING A FINAL NEGATIVE REPORT   Report Status PENDING   Incomplete     Studies/Results: No results found.   Assessment/Plan: MSSA bacteremia  Iliacus abscess  R THR  Poor dentition- s/p extraction of 11 teeth this AM.  Day 5 anbx (ancef/rifampin)  TTE (-)  Repeat BCx (4-17) pending For TEE   ? Ortho eval My great appreciation to Dr Enrique Sack, hospitalists   [name] Infectious Diseases [phone] 07/28/2011, 4:29 PM   LOS: 5 days

## 2011-07-28 NOTE — Discharge Instructions (Signed)

## 2011-07-28 NOTE — Transfer of Care (Signed)
Immediate Anesthesia Transfer of Care Note  [name]  Procedure(s) Performed: Procedure(s) (LRB): MULTIPLE EXTRACION WITH ALVEOLOPLASTY (N/A)  Patient Location: PACU  Anesthesia Type: General  Level of Consciousness: sedated  Airway & Oxygen Therapy: Patient Spontanous Breathing and Patient connected to face mask oxygen  Post-op Assessment: Report given to PACU RN and Post -op Vital signs reviewed and stable  Post vital signs: Reviewed and stable  Complications: No apparent anesthesia complications

## 2011-07-28 NOTE — Progress Notes (Signed)
UR complete 

## 2011-07-28 NOTE — Op Note (Signed)
Patient:            Steve [name] Date of Birth:  1955/10/07 MRN:                [id number]   DATE OF PROCEDURE:  07/28/2011               OPERATIVE REPORT   PREOPERATIVE DIAGNOSES: 1. Staphylococcal aureus bacteremia 2. Chronic apical periodontitis 3. Multiple retained root segments 4. Multiple dental caries  POSTOPERATIVE DIAGNOSES: 1. Staphylococcal aureus bacteremia 2. Chronic apical periodontitis 3. Multiple retained root segments 4. Multiple dental caries  OPERATIONS: 1. Multiple extraction of tooth numbers 2, 3, 4, 5, 6, 11, 12, 13, 15, 19, and 22.  2. 3 Quadrants of alveoloplasty   SURGEON: Lenn Cal, DDS  ANESTHESIA: General anesthesia via oral endotracheal tube.  MEDICATIONS: 1. Ancef 2 g IV prior to invasive dental procedures. 2. Local anesthesia with a total utilization of 4 carpules each containing 34 mg of lidocaine with 0.017 mg of epinephrine as well as 1 carpules each containing 9 mg of bupivacaine with 0.009 mg of epinephrine.  SPECIMENS: There are 11 teeth that were discarded.  DRAINS: None  CULTURES: None  COMPLICATIONS: None   ESTIMATED BLOOD LOSS: 100 mLs.  INTRAVENOUS FLUIDS: Lactated ringers solution per anesthesia record.  INDICATIONS: The patient was recently diagnosed with staphylococcal aureus bacteremia.  A dental consultation was then requested to rule out dental etiology and provide treatment is indicated for a poor dentition.  The patient was examined and treatment planned for traction remaining teeth with alveoloplasty and pre-prosthetic surgery as indicated.  This treatment plan was formulated to decrease the risks and complications associated with dental infection from affecting the patient's systemic health.  OPERATIVE FINDINGS: Patient was examined operating room number 12.  The teeth were identified for extraction. The patient was noted be affected by chronic periodontitis, apical periodontitis, and multiple dental  caries.   DESCRIPTION OF PROCEDURE: Patient was brought to the main operating room number 12. Patient was then placed in the supine position on the operating table. General Anesthesia was then induced per the anesthesia team. The patient was then prepped and draped in the usual manner for dental medicine procedure. A timeout was performed. The patient was identified and procedures were verified. A throat pack was placed at this time. The oral cavity was then thoroughly examined with the findings noted above. The patient was then ready for dental medicine procedure as follows:  Local anesthesia was then administered sequentially with a total utilization of 4 carpules each containing 34 mg of lidocaine with 0.017 mg of epinephrine as well as 1 carpules  each containing 9 mg bupivacaine with 0.009 mg of epinephrine.  The Maxillary left and right quadrants first approached. Anesthesia was then delivered utilizing infiltration with lidocaine with epinephrine. A #15 blade incision was then made from the maxillary right tuberosity and extended to the mesial number 8.  A  surgical flap was then carefully reflected. Appropriate amounts of buccal and interseptal bone were then removed as needed.  The teeth were then subluxated with a series of straight elevators. Tooth numbers 2, 3, 4, 5, 6 were then removed with a series of forceps to include a 150 and 53R forceps without complications. Alveoloplasty was then performed utilizing a ronguers and bone file. The surgical site was then irrigated with copious amounts of sterile saline. The tissues were approximated and trimmed appropriately. The surgical site was then closed from the axillary right  tuberosity and extended the mesial #8 utilizing 3-0 chromic gut suture in a continuous interrupted suture technique x1.  At this point time, the mandibular quadrants were approached. The patient was given an inferior alveolar nerve block and long buccal nerve block on the left  side utilizing the bupivacaine with epinephrine. Further infiltration was then achieved utilizing the lidocaine with epinephrine. A 15 blade incision was then made from the distal of number 20 and extended to the mesial of #24. A surgical flap was then carefully reflected. Tooth number 22 was then removed with a 151 forceps without complications. The retained root in the area of #19 was then removed with a rongeur. Alveoloplasty was then performed utilizing a rongeurs and bone file. The tissues were approximated and trimmed appropriately. The surgical sites were then irrigated with copious amounts of sterile saline. The surgical site was then closed from the distal of #20 and extended to the mesial #24. The surgical defect in the area #22 present secondary to the extensive periodontal disease was unable to be closed primarily. This area will need to heal in by secondary intention.  This point time the maxillary left surgical site was approached. A 15 blade incision was then made from the distal of #15 and extended to the mesial of #9. A surgical flap was then carefully reflected. Appropriate amounts of bone were then removed as needed. Tooth #15 was then removed with a 53L forceps without complications. Tooth numbers 13, 12, and retained root #11 were then removed with a 150 forceps without complications. Alveoloplasty was then performed utilizing a rongeurs and bone file. The surgical site was then irrigated with copious amounts of sterile saline. The surgical site was then closed from the maxillary left tuberosity and extended the mesial #9 utilizing 3-0 chromic gut suture in a continuous interrupted suture technique x1.  At this point time, the entire mouth was irrigated with copious amounts of sterile saline. The patient was exam for complications, seeing none, the dental medicine procedure was deemed to be complete. The throat pack was removed at this time. A series of 4 x 4 gauze were placed in the mouth  to aid hemostasis. The patient was then handed over to the anesthesia team for final disposition. After an appropriate amount of time, the patient was extubated and taken to the postanesthsia care unit with stable vital signs and a good condition. All counts were correct for the dental medicine procedure.   Lenn Cal, DDS.

## 2011-07-28 NOTE — Progress Notes (Signed)
PRE-OPERATIVE NOTE:  07/28/2011 Steve [name] PG:[id number]  VITALS: BP 119/68  Pulse 79  Temp(Src) 99.3 F (37.4 C) (Oral)  Resp 20  Ht 5' 6.5" (1.689 m)  Wt 205 lb 7.5 oz (93.2 kg)  BMI 32.67 kg/m2  SpO2 95%  CBC    Component Value Date/Time   WBC 16.1* 07/27/2011 0430   RBC 3.90* 07/27/2011 0430   HGB 11.2* 07/27/2011 0430   HCT 33.0* 07/27/2011 0430   PLT 244 07/27/2011 0430   MCV 84.6 07/27/2011 0430   MCH 28.7 07/27/2011 0430   MCHC 33.9 07/27/2011 0430   RDW 14.6 07/27/2011 0430   LYMPHSABS 1.4 07/23/2011 2000   MONOABS 2.3* 07/23/2011 2000   EOSABS 0.0 07/23/2011 2000   BASOSABS 0.0 07/23/2011 2000    BMET    Component Value Date/Time   NA 134* 07/27/2011 0430   K 3.6 07/27/2011 0430   CL 98 07/27/2011 0430   CO2 27 07/27/2011 0430   GLUCOSE 131* 07/27/2011 0430   BUN 10 07/27/2011 0430   CREATININE 0.76 07/27/2011 0430   CALCIUM 8.2* 07/27/2011 0430   GFRNONAA >90 07/27/2011 0430   GFRAA >90 07/27/2011 0430   No results found for this basename: INR, PROTIME   No results found for this basename: PTT   Steve [name] presents for extraction of remaining teeth in the OR with pre-prosthetic surgery as needed. Patient denies having any acute dental or medical changes. Patient is aware of risks, benefits,and potential complications of the dental procedures and accepts the risks and wishes to proceed as planned.  EXAM: No acute changes noted.  Assessments per initial dental consultation.  Plan: Proceed with multiple extractions with alveoloplasty and pre-prosthetic surgery in the OR today.   Dr. Teena Dunk

## 2011-07-28 NOTE — Anesthesia Procedure Notes (Signed)
Procedure Name: Intubation Date/Time: 07/28/2011 11:45 AM Performed by: Lind Covert Pre-anesthesia Checklist: Patient identified, Timeout performed, Emergency Drugs available, Suction available and Patient being monitored Patient Re-evaluated:Patient Re-evaluated prior to inductionOxygen Delivery Method: Circle system utilized Preoxygenation: Pre-oxygenation with 100% oxygen Intubation Type: IV induction Ventilation: Mask ventilation without difficulty Laryngoscope Size: Mac and 4 Grade View: Grade I Nasal Tubes: Nasal Rae and Magill forceps- large, utilized Number of attempts: 4 Airway Equipment and Method: Stylet Placement Confirmation: ETT inserted through vocal cords under direct vision,  breath sounds checked- equal and bilateral and positive ETCO2 Secured at: 21 cm Tube secured with: Tape

## 2011-07-28 NOTE — Preoperative (Signed)
Beta Blockers   Reason not to administer Beta Blockers:Not Applicable 

## 2011-07-28 NOTE — Progress Notes (Signed)
Subjective: States he feels pretty good today. Complete edenturation this afternoon . I have also arranged for the patient had a TEE for tomorrow.  Objective: Filed Vitals:   07/28/11 1449 07/28/11 1556 07/28/11 1700 07/28/11 1800  BP: 119/74 131/83 133/83 135/82  Pulse: 79 77 81   Temp: 98.7 F (37.1 C) 98.3 F (36.8 C) 98.2 F (36.8 C) 98.1 F (36.7 C)  TempSrc:  Axillary Axillary Oral  Resp: 20 18 18 18   Height:      Weight:      SpO2: 95% 94% 93% 99%   Weight change:   Intake/Output Summary (Last 24 hours) at 07/28/11 1916 Last data filed at 07/28/11 1801  Gross per 24 hour  Intake   1985 ml  Output   2575 ml  Net   -590 ml    General: Alert, awake, oriented x3, in no acute distress.  HEENT: [place]/AT PEERL, EOMI Neck: Trachea midline,  no masses, no thyromegal,y no JVD, no carotid bruit OROPHARYNX:  Moist, edentulous.  Heart: Regular rate and rhythm.  Lungs: Clear to auscultation, no wheezing or rhonchi noted. No increased vocal fremitus resonant to percussion  Abdomen: Soft, nontender, nondistended, positive bowel sounds, no masses no hepatosplenomegaly noted..  Musculoskeletal: No warm swelling or erythema around joints, no spinal tenderness noted.    Lab Results:  Basename 07/27/11 0430 07/26/11 0416  NA 134* 129*  K 3.6 3.5  CL 98 95*  CO2 27 26  GLUCOSE 131* 122*  BUN 10 10  CREATININE 0.76 0.80  CALCIUM 8.2* 8.1*  MG -- --  PHOS -- --    Basename 07/27/11 0430  AST 71*  ALT 62*  ALKPHOS 101  BILITOT 2.1*  PROT 6.3  ALBUMIN 2.3*   No results found for this basename: LIPASE:2,AMYLASE:2 in the last 72 hours  Basename 07/27/11 0430  WBC 16.1*  NEUTROABS --  HGB 11.2*  HCT 33.0*  MCV 84.6  PLT 244   No results found for this basename: CKTOTAL:3,CKMB:3,CKMBINDEX:3,TROPONINI:3 in the last 72 hours No components found with this basename: POCBNP:3 No results found for this basename: DDIMER:2 in the last 72 hours No results found for this  basename: HGBA1C:2 in the last 72 hours No results found for this basename: CHOL:2,HDL:2,LDLCALC:2,TRIG:2,CHOLHDL:2,LDLDIRECT:2 in the last 72 hours No results found for this basename: TSH,T4TOTAL,FREET3,T3FREE,THYROIDAB in the last 72 hours No results found for this basename: VITAMINB12:2,FOLATE:2,FERRITIN:2,TIBC:2,IRON:2,RETICCTPCT:2 in the last 72 hours  Micro Results: Recent Results (from the past 240 hour(s))  CULTURE, BLOOD (ROUTINE X 2)     Status: Normal   Collection Time   07/24/11 12:06 AM      Component Value Range Status Comment   Specimen Description Blood   Final    Special Requests NONE   Final    Culture  Setup Time [id number]   Final    Culture     Final    Value: STAPHYLOCOCCUS AUREUS     Note: RIFAMPIN AND GENTAMICIN SHOULD NOT BE USED AS SINGLE DRUGS FOR TREATMENT OF STAPH INFECTIONS.     Note: Gram Stain Report Called to,Read Back By and Verified With: [name] 07/25/11 0850 BY [name]   Report Status 07/27/2011 FINAL   Final    Organism ID, Bacteria STAPHYLOCOCCUS AUREUS   Final   CULTURE, BLOOD (ROUTINE X 2)     Status: Normal   Collection Time   07/24/11 12:22 AM      Component Value Range Status Comment   Specimen Description BLOOD LEFT  ARM   Final    Special Requests BOTTLES DRAWN AEROBIC AND ANAEROBIC  5ML   Final    Culture  Setup Time [id number]   Final    Culture     Final    Value: STAPHYLOCOCCUS AUREUS     Note: SUSCEPTIBILITIES PERFORMED ON PREVIOUS CULTURE WITHIN THE LAST 5 DAYS.     Note: Gram Stain Report Called to,Read Back By and Verified With: [name] 07/25/11 0850 BY [name]   Report Status 07/27/2011 FINAL   Final   CULTURE, BLOOD (ROUTINE X 2)     Status: Normal (Preliminary result)   Collection Time   07/24/11  7:21 PM      Component Value Range Status Comment   Specimen Description BLOOD LEFT ARM  10 ML IN [facility] BOTTLE   Final    Special Requests NONE   Final    Culture  Setup Time [id number]   Final    Culture     Final     Value:        BLOOD CULTURE RECEIVED NO GROWTH TO DATE CULTURE WILL BE HELD FOR 5 DAYS BEFORE ISSUING A FINAL NEGATIVE REPORT   Report Status PENDING   Incomplete     Studies/Results: Dg Orthopantogram  07/26/2011  *RADIOLOGY REPORT*  Clinical Data: Staph infection of the right hip.  Question periapical abscess.  ORTHOPANTOGRAM/PANORAMIC  Comparison: None.  Findings: The patient is nearly the edentulous.  Four maxillary teeth on the right are identified.  There are cavities in each of these teeth.  No definite periapical abscess is identified.  There is an elongated radiodense structure in the expected location of a lower left medial lower incisor which may represent a dental implant. There is periapical lucency about this structure.  No fracture is identified.  IMPRESSION:  1.  Possible left lower incisor dental implant has periapical lucency about it worrisome for abscess. 2.  Cavities are present in all maxillary teeth on the right.  Original Report Authenticated By: Arvid Right. Luther Parody, M.D.   Ct Abdomen Pelvis W Contrast  07/23/2011  *RADIOLOGY REPORT*  Clinical Data: Right lower quadrant/groin pain, fever, prior hernia repair, evaluate for appendicitis  CT ABDOMEN AND PELVIS WITH CONTRAST  Technique:  Multidetector CT imaging of the abdomen and pelvis was performed following the standard protocol during bolus administration of intravenous contrast.  Contrast: 170mL OMNIPAQUE IOHEXOL 300 MG/ML  SOLN  Comparison: 09/02/2010  Findings: Mild dependent atelectasis at the lung bases.  Liver, spleen, pancreas, and adrenal glands are within normal limits.  Gallbladder is unremarkable.  No intrahepatic or extrahepatic ductal dilatation.  Kidneys are within normal limits.  No hydronephrosis.  No evidence of bowel obstruction.  Normal appendix.  Colonic diverticulosis, without associated inflammatory changes.  No evidence of abdominal aortic aneurysm.  No abdominopelvic ascites.  No suspicious abdominopelvic  lymphadenopathy.  Prostate and bladder are partially obscured by streak artifact from the patient's bilateral hip prostheses.  Tiny fat-containing level hernia.  Suspected postsurgical changes in the right inguinal region.  Enlargement of the right iliacus muscle with associated 1.5 x 2.3 cm intramuscular hypodensity (series 2/image 66) and mild right retroperitoneal stranding (series 2/image 59).  Degenerative changes of the visualized thoracolumbar spine. Bilateral hip arthroplasties.  IMPRESSION: Enlargement of the right iliacus muscle with associated 1.5 x 2.3 intramuscular hypodensity and mild right retroperitoneal stranding. This appearance suggests a retroperitoneal hematoma or less likely intramuscular infection/abscess.  Normal appendix.  No evidence of bowel obstruction.  Original Report  Authenticated By: Julian Hy, M.D.    Medications: I have reviewed the patient's current medications. Scheduled Meds:    . docusate sodium  50 mg Oral BID  . NONFORMULARY OR COMPOUNDED ITEM 1 each  1 each Mouth Rinse Q2H while awake  . polyethylene glycol  17 g Oral Daily  . rifampin  300 mg Oral Daily  . DISCONTD: NONFORMULARY OR COMPOUNDED ITEM 1 each  1 each Mouth Rinse Q2H while awake  . DISCONTD: oxymetazoline  1 spray Each Nare BID  . DISCONTD: sodium chloride irrigation  200 mL Irrigation Q2H while awake  . DISCONTD: sodium chloride irrigation  200 mL Irrigation Q2H while awake   Continuous Infusions:    . sodium chloride 50 mL/hr at 07/28/11 1703  . DISCONTD: sodium chloride Stopped (07/28/11 1230)  . DISCONTD: lactated ringers Stopped (07/28/11 1330)   PRN Meds:.acetaminophen, acetaminophen, alum & mag hydroxide-simeth, HYDROmorphone, NONFORMULARY OR COMPOUNDED ITEM 1 each, ondansetron (ZOFRAN) IV, ondansetron, oxyCODONE, zolpidem, DISCONTD: Bupivacaine-Epinephrine PF, DISCONTD: HYDROmorphone, DISCONTD: lidocaine-EPINEPHrine, DISCONTD: meperidine (DEMEROL) injection, DISCONTD:  NONFORMULARY OR COMPOUNDED ITEM 1 each, DISCONTD: promethazine Assessment/Plan: Patient Active Hospital Problem List: 1-Right iliacus myositis/abscess: at this point per GS recommendations no surgery indicated due to the size of abscess. Will continue treatment with IV antibiotics (vancomycin) and supportive care/antipyretics. Per ID will add rifampin due to concerns of involvement of his right hip.    2-GPC bacteremia: continue vancomycin; WBC's elevated again, Tmax- 101.8. No murmurs appreciated on exam.  Results of 2-D echo negative for vegetation. I will get TEE. Per ID recommendations will probably 6 weeks of IV antibiotics; BC from 4/14 shows MSSA.  BC pending from yesterday. Will order PICC line to be placed when blood cultures resulted . For TEE tomorrow.  3-Fever: due to # 1; Tmax for last 24 hrs 101.1.  Continue antibiotics and antipyretics.   4-OA: continue PRN pain meds.   5-Constipation due to pain meds; will continue bowel regimen   6-GERD: continue PPI   7-OSA: will continue CPAP.   8-DVT: SCD's   9-Poor dentition:Orthopantogram shows possible abscess. The patient had edenturation performed today    LOS: 5 days

## 2011-07-29 ENCOUNTER — Encounter (HOSPITAL_COMMUNITY): Payer: Self-pay | Admitting: *Deleted

## 2011-07-29 ENCOUNTER — Encounter (HOSPITAL_COMMUNITY)
Admission: EM | Disposition: A | Discharge status: Home / Self Care | Payer: Self-pay | Source: Home / Self Care | Attending: Internal Medicine

## 2011-07-29 DIAGNOSIS — R7881 Bacteremia: Secondary | ICD-10-CM

## 2011-07-29 HISTORY — PX: TEE WITHOUT CARDIOVERSION: SHX5443

## 2011-07-29 SURGERY — ECHOCARDIOGRAM, TRANSESOPHAGEAL
Anesthesia: Moderate Sedation

## 2011-07-29 MED ORDER — BENZOCAINE 20 % MT SOLN
1.0000 "application " | OROMUCOSAL | Status: DC | PRN
  Filled 2011-07-29: qty 57

## 2011-07-29 MED ORDER — SODIUM CHLORIDE 0.45 % IV SOLN: INTRAVENOUS | Status: DC

## 2011-07-29 MED ORDER — FENTANYL CITRATE 0.05 MG/ML IJ SOLN
INTRAMUSCULAR | Status: DC | PRN
  Administered 2011-07-29: 25 ug via INTRAVENOUS

## 2011-07-29 MED ORDER — SODIUM CHLORIDE 0.9 % IJ SOLN
10.0000 mL | INTRAMUSCULAR | Status: DC | PRN
  Administered 2011-07-31 – 2011-08-01 (×3): 10 mL

## 2011-07-29 MED ORDER — FENTANYL CITRATE 0.05 MG/ML IJ SOLN
INTRAMUSCULAR | Status: AC
  Filled 2011-07-29: qty 2

## 2011-07-29 MED ORDER — SODIUM CHLORIDE 0.9 % IJ SOLN: 3.0000 mL | Freq: Two times a day (BID) | INTRAMUSCULAR | Status: DC

## 2011-07-29 MED ORDER — SODIUM CHLORIDE 0.9 % IJ SOLN: 3.0000 mL | INTRAMUSCULAR | Status: DC | PRN

## 2011-07-29 MED ORDER — MIDAZOLAM HCL 10 MG/2ML IJ SOLN: 10.0000 mg | Freq: Once | INTRAMUSCULAR | Status: DC

## 2011-07-29 MED ORDER — MIDAZOLAM HCL 10 MG/2ML IJ SOLN
INTRAMUSCULAR | Status: DC | PRN
  Administered 2011-07-29: 2 mg via INTRAVENOUS

## 2011-07-29 MED ORDER — MIDAZOLAM HCL 10 MG/2ML IJ SOLN
INTRAMUSCULAR | Status: AC
  Filled 2011-07-29: qty 2

## 2011-07-29 MED ORDER — FENTANYL CITRATE 0.05 MG/ML IJ SOLN
250.0000 ug | Freq: Once | INTRAMUSCULAR | Status: DC
Start: 2011-07-29 — End: 2011-07-29

## 2011-07-29 MED ORDER — SODIUM CHLORIDE 0.9 % IV SOLN: 250.0000 mL | INTRAVENOUS | Status: DC | PRN

## 2011-07-29 MED ORDER — BUTAMBEN-TETRACAINE-BENZOCAINE 2-2-14 % EX AERO
INHALATION_SPRAY | CUTANEOUS | Status: DC | PRN
  Administered 2011-07-29: 2 via TOPICAL

## 2011-07-29 MED ORDER — DIPHENHYDRAMINE HCL 50 MG/ML IJ SOLN
INTRAMUSCULAR | Status: AC
  Filled 2011-07-29: qty 1

## 2011-07-29 MED FILL — Sodium Chloride Irrigation Soln 0.9%: Qty: 500 | Status: CN

## 2011-07-29 NOTE — Progress Notes (Signed)
INITIAL ADULT NUTRITION ASSESSMENT Date: 07/29/2011   Time: 10:40 AM Reason for Assessment: Consult  Food/Nutrition Related Hx: Pt admitted with right hip pain, found to have abscess of illiacus muscle that had staphylococcus aureus bacteremia. Pt noted to have poor dentition and dentistry was consulted to r/o possible dental etiology for infection. Dental extraction of remaining teeth performed yesterday. Discussed recommended food choices for pt, as pt now edentulous. Reviewed importance of soft moist foods and provided handout on recommended foods from every food group. Pt seemed eager to learn and stated that his father did not have any teeth and he did well with eating. Pt does not think he will have any problems following this diet and expressed understanding of information. Pt reports eating well PTA, great appetite, no changes in weight, and doing well with clear liquid diet. Awaiting further diet advancement.   ASSESSMENT: Male 56 y.o.  Dx: Painful right hip  Hx:  Past Medical History  Diagnosis Date  . Sleep apnea     cpap not available  . GERD (gastroesophageal reflux disease)     alot of reflux   Related Meds:  Scheduled Meds:   . docusate sodium  50 mg Oral BID  . NONFORMULARY OR COMPOUNDED ITEM 1 each  1 each Mouth Rinse Q2H while awake  . polyethylene glycol  17 g Oral Daily  . rifampin  300 mg Oral Daily  . DISCONTD: NONFORMULARY OR COMPOUNDED ITEM 1 each  1 each Mouth Rinse Q2H while awake  . DISCONTD: oxymetazoline  1 spray Each Nare BID  . DISCONTD: sodium chloride irrigation  200 mL Irrigation Q2H while awake  . DISCONTD: sodium chloride irrigation  200 mL Irrigation Q2H while awake   Continuous Infusions:   . sodium chloride 50 mL/hr at 07/28/11 1703  . DISCONTD: sodium chloride Stopped (07/28/11 1230)  . DISCONTD: lactated ringers Stopped (07/28/11 1330)   PRN Meds:.acetaminophen, acetaminophen, alum & mag hydroxide-simeth, HYDROmorphone, NONFORMULARY OR  COMPOUNDED ITEM 1 each, ondansetron (ZOFRAN) IV, ondansetron, oxyCODONE, zolpidem, DISCONTD: Bupivacaine-Epinephrine PF, DISCONTD: HYDROmorphone, DISCONTD: lidocaine-EPINEPHrine, DISCONTD: meperidine (DEMEROL) injection, DISCONTD: NONFORMULARY OR COMPOUNDED ITEM 1 each, DISCONTD: promethazine  Ht: 5' 6.5" (168.9 cm)  Wt: 205 lb 7.5 oz (93.2 kg)  Ideal Wt: 142 lb % Ideal Wt: 144  Usual Wt: 205 lb % Usual Wt: 100  Body mass index is 32.67 kg/(m^2).  Labs:  CMP     Component Value Date/Time   NA 134* 07/27/2011 0430   K 3.6 07/27/2011 0430   CL 98 07/27/2011 0430   CO2 27 07/27/2011 0430   GLUCOSE 131* 07/27/2011 0430   BUN 10 07/27/2011 0430   CREATININE 0.76 07/27/2011 0430   CALCIUM 8.2* 07/27/2011 0430   PROT 6.3 07/27/2011 0430   ALBUMIN 2.3* 07/27/2011 0430   AST 71* 07/27/2011 0430   ALT 62* 07/27/2011 0430   ALKPHOS 101 07/27/2011 0430   BILITOT 2.1* 07/27/2011 0430   GFRNONAA >90 07/27/2011 0430   GFRAA >90 07/27/2011 0430    Intake/Output Summary (Last 24 hours) at 07/29/11 1042 Last data filed at 07/29/11 0845  Gross per 24 hour  Intake 2147.5 ml  Output   1175 ml  Net  972.5 ml   Last BM - 07/27/11  Diet Order: Clear Liquid   IVF:    sodium chloride Last Rate: 50 mL/hr at 07/28/11 1703  DISCONTD: sodium chloride Last Rate: Stopped (07/28/11 1230)  DISCONTD: lactated ringers Last Rate: Stopped (07/28/11 1330)    Estimated Nutritional Needs:  Kcal: 1650-1900 Protein: 65-80g Fluid: 1.6-1.9L  NUTRITION DIAGNOSIS: -Inadequate oral intake (NI-2.1).  Status: Ongoing  RELATED TO: POD#1 teeth extraction  AS EVIDENCE BY: OR reports, clear liquid diet  MONITORING/EVALUATION(Goals): Advance diet as tolerated to dysphagia 3 mechanical soft diet.   EDUCATION NEEDS: -Education needs addressed - reviewed edentulous diet  INTERVENTION: Diet advancement per MD. Will monitor.   Dietitian #: [phone]  [place] Per approved criteria  -Obesity Unspecified      Glory Rosebush 07/29/2011, 10:40 AM

## 2011-07-29 NOTE — Progress Notes (Signed)
Placed pt on cpap for rest on home settings of 7cm h2o with 2l o2 bleedin, pt is tolerating it well at this time, RN aware.

## 2011-07-29 NOTE — Progress Notes (Signed)
Subjective: States he feels pretty good today. Concerned about ability to ambulate in setting of pain.  Interval history: Patient had a TEE this morning which was negative, TTE negative. Patient on day #6 of Ancef and rifampin.  Objective: Filed Vitals:   07/29/11 1335 07/29/11 1340 07/29/11 1345 07/29/11 1355  BP: 139/86 141/82 130/77 136/85  Pulse:      Temp:      TempSrc:      Resp: 18 32 20 20  Height:      Weight:      SpO2: 95% 95% 95% 95%   Weight change:   Intake/Output Summary (Last 24 hours) at 07/29/11 1820 Last data filed at 07/29/11 1819  Gross per 24 hour  Intake 1247.5 ml  Output   1630 ml  Net -382.5 ml    General: Alert, awake, oriented x3, in no acute distress.  HEENT: [place]/AT PEERL, EOMI Neck: Trachea midline,  no masses, no thyromegal,y no JVD, no carotid bruit OROPHARYNX:  Moist, edentulous.  Heart: Regular rate and rhythm.  Lungs: Clear to auscultation, no wheezing or rhonchi noted. No increased vocal fremitus resonant to percussion  Abdomen: Soft, nontender, nondistended, positive bowel sounds, no masses no hepatosplenomegaly noted..  Musculoskeletal: No warm swelling or erythema around joints, no spinal tenderness noted.    Lab Results:  [facility] 07/27/11 0430  NA 134*  K 3.6  CL 98  CO2 27  GLUCOSE 131*  BUN 10  CREATININE 0.76  CALCIUM 8.2*  MG --  PHOS --    Basename 07/27/11 0430  AST 71*  ALT 62*  ALKPHOS 101  BILITOT 2.1*  PROT 6.3  ALBUMIN 2.3*   No results found for this basename: LIPASE:2,AMYLASE:2 in the last 72 hours  Basename 07/27/11 0430  WBC 16.1*  NEUTROABS --  HGB 11.2*  HCT 33.0*  MCV 84.6  PLT 244   No results found for this basename: CKTOTAL:3,CKMB:3,CKMBINDEX:3,TROPONINI:3 in the last 72 hours No components found with this basename: POCBNP:3 No results found for this basename: DDIMER:2 in the last 72 hours No results found for this basename: HGBA1C:2 in the last 72 hours No results found for this  basename: CHOL:2,HDL:2,LDLCALC:2,TRIG:2,CHOLHDL:2,LDLDIRECT:2 in the last 72 hours No results found for this basename: TSH,T4TOTAL,FREET3,T3FREE,THYROIDAB in the last 72 hours No results found for this basename: VITAMINB12:2,FOLATE:2,FERRITIN:2,TIBC:2,IRON:2,RETICCTPCT:2 in the last 72 hours  Micro Results: Recent Results (from the past 240 hour(s))  CULTURE, BLOOD (ROUTINE X 2)     Status: Normal   Collection Time   07/24/11 12:06 AM      Component Value Range Status Comment   Specimen Description Blood   Final    Special Requests NONE   Final    Culture  Setup Time [id number]   Final    Culture     Final    Value: STAPHYLOCOCCUS AUREUS     Note: RIFAMPIN AND GENTAMICIN SHOULD NOT BE USED AS SINGLE DRUGS FOR TREATMENT OF STAPH INFECTIONS.     Note: Gram Stain Report Called to,Read Back By and Verified With: [name] 07/25/11 0850 BY [name]   Report Status 07/27/2011 FINAL   Final    Organism ID, Bacteria STAPHYLOCOCCUS AUREUS   Final   CULTURE, BLOOD (ROUTINE X 2)     Status: Normal   Collection Time   07/24/11 12:22 AM      Component Value Range Status Comment   Specimen Description BLOOD LEFT ARM   Final    Special Requests BOTTLES DRAWN AEROBIC AND ANAEROBIC  5ML   Final    Culture  Setup Time [id number]   Final    Culture     Final    Value: STAPHYLOCOCCUS AUREUS     Note: SUSCEPTIBILITIES PERFORMED ON PREVIOUS CULTURE WITHIN THE LAST 5 DAYS.     Note: Gram Stain Report Called to,Read Back By and Verified With: [name] 07/25/11 0850 BY [name]   Report Status 07/27/2011 FINAL   Final   CULTURE, BLOOD (ROUTINE X 2)     Status: Normal (Preliminary result)   Collection Time   07/24/11  7:21 PM      Component Value Range Status Comment   Specimen Description BLOOD LEFT ARM  10 ML IN [facility] BOTTLE   Final    Special Requests NONE   Final    Culture  Setup Time [id number]   Final    Culture     Final    Value:        BLOOD CULTURE RECEIVED NO GROWTH TO DATE CULTURE  WILL BE HELD FOR 5 DAYS BEFORE ISSUING A FINAL NEGATIVE REPORT   Report Status PENDING   Incomplete   CULTURE, BLOOD (ROUTINE X 2)     Status: Normal (Preliminary result)   Collection Time   07/27/11  3:10 PM      Component Value Range Status Comment   Specimen Description BLOOD LEFT ARM   Final    Special Requests BOTTLES DRAWN AEROBIC AND ANAEROBIC 10CC   Final    Culture  Setup Time [id number]   Final    Culture     Final    Value:        BLOOD CULTURE RECEIVED NO GROWTH TO DATE CULTURE WILL BE HELD FOR 5 DAYS BEFORE ISSUING A FINAL NEGATIVE REPORT   Report Status PENDING   Incomplete     Studies/Results: Dg Orthopantogram  07/26/2011  *RADIOLOGY REPORT*  Clinical Data: Staph infection of the right hip.  Question periapical abscess.  ORTHOPANTOGRAM/PANORAMIC  Comparison: None.  Findings: The patient is nearly the edentulous.  Four maxillary teeth on the right are identified.  There are cavities in each of these teeth.  No definite periapical abscess is identified.  There is an elongated radiodense structure in the expected location of a lower left medial lower incisor which may represent a dental implant. There is periapical lucency about this structure.  No fracture is identified.  IMPRESSION:  1.  Possible left lower incisor dental implant has periapical lucency about it worrisome for abscess. 2.  Cavities are present in all maxillary teeth on the right.  Original Report Authenticated By: Arvid Right. Luther Parody, M.D.   Ct Abdomen Pelvis W Contrast  07/23/2011  *RADIOLOGY REPORT*  Clinical Data: Right lower quadrant/groin pain, fever, prior hernia repair, evaluate for appendicitis  CT ABDOMEN AND PELVIS WITH CONTRAST  Technique:  Multidetector CT imaging of the abdomen and pelvis was performed following the standard protocol during bolus administration of intravenous contrast.  Contrast: 157mL OMNIPAQUE IOHEXOL 300 MG/ML  SOLN  Comparison: 09/02/2010  Findings: Mild dependent atelectasis at the  lung bases.  Liver, spleen, pancreas, and adrenal glands are within normal limits.  Gallbladder is unremarkable.  No intrahepatic or extrahepatic ductal dilatation.  Kidneys are within normal limits.  No hydronephrosis.  No evidence of bowel obstruction.  Normal appendix.  Colonic diverticulosis, without associated inflammatory changes.  No evidence of abdominal aortic aneurysm.  No abdominopelvic ascites.  No suspicious abdominopelvic lymphadenopathy.  Prostate and bladder are partially obscured  by streak artifact from the patient's bilateral hip prostheses.  Tiny fat-containing level hernia.  Suspected postsurgical changes in the right inguinal region.  Enlargement of the right iliacus muscle with associated 1.5 x 2.3 cm intramuscular hypodensity (series 2/image 66) and mild right retroperitoneal stranding (series 2/image 59).  Degenerative changes of the visualized thoracolumbar spine. Bilateral hip arthroplasties.  IMPRESSION: Enlargement of the right iliacus muscle with associated 1.5 x 2.3 intramuscular hypodensity and mild right retroperitoneal stranding. This appearance suggests a retroperitoneal hematoma or less likely intramuscular infection/abscess.  Normal appendix.  No evidence of bowel obstruction.  Original Report Authenticated By: Julian Hy, M.D.    Medications: I have reviewed the patient's current medications. Scheduled Meds:    . docusate sodium  50 mg Oral BID  . NONFORMULARY OR COMPOUNDED ITEM 1 each  1 each Mouth Rinse Q2H while awake  . polyethylene glycol  17 g Oral Daily  . rifampin  300 mg Oral Daily  . DISCONTD: fentaNYL  250 mcg Intravenous Once  . DISCONTD: midazolam  10 mg Intravenous Once  . DISCONTD: sodium chloride  3 mL Intravenous Q12H   Continuous Infusions:    . sodium chloride 50 mL/hr at 07/28/11 1703  . DISCONTD: sodium chloride     PRN Meds:.acetaminophen, acetaminophen, alum & mag hydroxide-simeth, HYDROmorphone, NONFORMULARY OR COMPOUNDED ITEM 1  each, ondansetron (ZOFRAN) IV, ondansetron, oxyCODONE, zolpidem, DISCONTD: sodium chloride, DISCONTD: benzocaine, DISCONTD: butamben-tetracaine-benzocaine, DISCONTD: fentaNYL, DISCONTD: midazolam, DISCONTD: sodium chloride Assessment/Plan: Patient Active Hospital Problem List: 1-Right iliacus myositis/abscess: at this point per GS recommendations no surgery indicated due to the size of abscess. Will continue treatment with IV antibiotics (vancomycin) and supportive care/antipyretics. Per ID will add rifampin due to concerns of involvement of his right hip.  Will have PICC line placed in anticipation of patient going home with a total of 6 weeks of antibiotics. And we will arrange for him to followup in the ID clinic at the time of discharge  2-GPC bacteremia: TEE negative for any vegetations.  3-Fever: due to # 1; Tmax for last 24 hrs 101.1.  Continue antibiotics and antipyretics.   4-OA: continue PRN pain meds.   5-Constipation due to pain meds; will continue bowel regimen   6-GERD: continue PPI   7-OSA: will continue CPAP.   8-DVT: SCD's   9-Poor dentition: Status post extraction of 11 teeth yesterday. The patient is doing well and we'll advance diet.    LOS: 6 days

## 2011-07-29 NOTE — Interval H&P Note (Signed)
History and Physical Interval Note:  07/29/2011 1:06 PM  Steve [name]  has presented today for surgery, with the diagnosis of Bacteremia  The various methods of treatment have been discussed with the patient and family. After consideration of risks, benefits and other options for treatment, the patient has consented to  Procedure(s) (LRB): TRANSESOPHAGEAL ECHOCARDIOGRAM (TEE) (N/A) as a surgical intervention .  The patients' history has been reviewed, patient examined, no change in status, stable for surgery.  I have reviewed the patients' chart and labs.  Questions were answered to the patient's satisfaction.     [name]  Bacteremia.  TTE ok TEE requested to R/O SBE 1:07 PM 07/29/2011

## 2011-07-29 NOTE — Progress Notes (Signed)
INFECTIOUS DISEASE PROGRESS NOTE  ID: Steve [name] is a 56 y.o. male with   Active Problems:  Staphylococcus aureus bacteremia  Subjective: Decreased hip pain. No dental pain. Has been up ambulating.   Abtx:  Anti-infectives     Start     Dose/Rate Route Frequency Ordered Stop   07/26/11 1300   rifampin (RIFADIN) capsule 300 mg        300 mg Oral Daily 07/26/11 1146     07/25/11 2359   vancomycin (VANCOCIN) 1,250 mg in sodium chloride 0.9 % 250 mL IVPB  Status:  Discontinued        1,250 mg 166.7 mL/hr over 90 Minutes Intravenous Every 8 hours 07/25/11 1743 07/27/11 1440   07/24/11 0900   vancomycin (VANCOCIN) IVPB 1000 mg/200 mL premix  Status:  Discontinued        1,000 mg 200 mL/hr over 60 Minutes Intravenous Every 8 hours 07/24/11 0140 07/25/11 1742   07/24/11 0100   vancomycin (VANCOCIN) IVPB 1000 mg/200 mL premix        1,000 mg 200 mL/hr over 60 Minutes Intravenous  Once 07/24/11 0052 07/24/11 0226          Medications:  Scheduled:   . docusate sodium  50 mg Oral BID  . NONFORMULARY OR COMPOUNDED ITEM 1 each  1 each Mouth Rinse Q2H while awake  . polyethylene glycol  17 g Oral Daily  . rifampin  300 mg Oral Daily  . DISCONTD: fentaNYL  250 mcg Intravenous Once  . DISCONTD: midazolam  10 mg Intravenous Once  . DISCONTD: sodium chloride  3 mL Intravenous Q12H    Objective: Vital signs in last 24 hours: Temp:  [97.7 F (36.5 C)-99.5 F (37.5 C)] 99.5 F (37.5 C) (04/19 1205) Pulse Rate:  [74-81] 74  (04/19 1000) Resp:  [18-32] 20  (04/19 1355) BP: (107-151)/(66-97) 136/85 mmHg (04/19 1355) SpO2:  [93 %-99 %] 95 % (04/19 1355)   General appearance: alert, cooperative and no distress Resp: clear to auscultation bilaterally Cardio: regularly irregular rhythm GI: normal findings: bowel sounds normal and soft, non-tender Extremities: improved abliity to abduct leg  Lab Results  Basename 07/27/11 0430  WBC 16.1*  HGB 11.2*  HCT 33.0*  NA 134*  K  3.6  CL 98  CO2 27  BUN 10  CREATININE 0.76  GLU --   Liver Panel  Basename 07/27/11 0430  PROT 6.3  ALBUMIN 2.3*  AST 71*  ALT 62*  ALKPHOS 101  BILITOT 2.1*  BILIDIR --  IBILI --   Sedimentation Rate No results found for this basename: ESRSEDRATE in the last 72 hours C-Reactive Protein No results found for this basename: CRP:2 in the last 72 hours  Microbiology: Recent Results (from the past 240 hour(s))  CULTURE, BLOOD (ROUTINE X 2)     Status: Normal   Collection Time   07/24/11 12:06 AM      Component Value Range Status Comment   Specimen Description Blood   Final    Special Requests NONE   Final    Culture  Setup Time [id number]   Final    Culture     Final    Value: STAPHYLOCOCCUS AUREUS     Note: RIFAMPIN AND GENTAMICIN SHOULD NOT BE USED AS SINGLE DRUGS FOR TREATMENT OF STAPH INFECTIONS.     Note: Gram Stain Report Called to,Read Back By and Verified With: [name] 07/25/11 0850 BY [name]   Report Status 07/27/2011 FINAL  Final    Organism ID, Bacteria STAPHYLOCOCCUS AUREUS   Final   CULTURE, BLOOD (ROUTINE X 2)     Status: Normal   Collection Time   07/24/11 12:22 AM      Component Value Range Status Comment   Specimen Description BLOOD LEFT ARM   Final    Special Requests BOTTLES DRAWN AEROBIC AND ANAEROBIC  5ML   Final    Culture  Setup Time [id number]   Final    Culture     Final    Value: STAPHYLOCOCCUS AUREUS     Note: SUSCEPTIBILITIES PERFORMED ON PREVIOUS CULTURE WITHIN THE LAST 5 DAYS.     Note: Gram Stain Report Called to,Read Back By and Verified With: [name] 07/25/11 0850 BY [name]   Report Status 07/27/2011 FINAL   Final   CULTURE, BLOOD (ROUTINE X 2)     Status: Normal (Preliminary result)   Collection Time   07/24/11  7:21 PM      Component Value Range Status Comment   Specimen Description BLOOD LEFT ARM  10 ML IN [facility] BOTTLE   Final    Special Requests NONE   Final    Culture  Setup Time [id number]   Final    Culture      Final    Value:        BLOOD CULTURE RECEIVED NO GROWTH TO DATE CULTURE WILL BE HELD FOR 5 DAYS BEFORE ISSUING A FINAL NEGATIVE REPORT   Report Status PENDING   Incomplete   CULTURE, BLOOD (ROUTINE X 2)     Status: Normal (Preliminary result)   Collection Time   07/27/11  3:10 PM      Component Value Range Status Comment   Specimen Description BLOOD LEFT ARM   Final    Special Requests BOTTLES DRAWN AEROBIC AND ANAEROBIC 10CC   Final    Culture  Setup Time [id number]   Final    Culture     Final    Value:        BLOOD CULTURE RECEIVED NO GROWTH TO DATE CULTURE WILL BE HELD FOR 5 DAYS BEFORE ISSUING A FINAL NEGATIVE REPORT   Report Status PENDING   Incomplete     Studies/Results: No results found.   Assessment/Plan: MSSA bacteremia  Iliacus abscess  R THR  Poor dentition- s/p extraction of 11 teeth this AM.  Day 6 anbx (ancef/rifampin)  TTE (-) and TEE (-) Repeat BCx (4-17) NGTD  Would plan for him to go home with 6 weeks of IV ancef and po rifampin. Would be glad to see him in ID clinic in f/u.  He may need f/u imaging of his abscess and hip to assure resolution.    [name] Infectious Diseases [id number] 07/29/2011, 4:39 PM   LOS: 6 days

## 2011-07-29 NOTE — CV Procedure (Signed)
Transesophageal Echocardiogram: Indication: R/O SBE Sedation: Versed: 4, Fentanyl: 50, Other: none ASA: 2, Airway: 2 had CPAP on during case  Procedure:  The patient was moderately sedated with the above doses of versed and fentanyl.  Using digital technique an omniplane probe was advanced into the distal esophagus without incident. Transgastric imaging revealed normal LV function with no RWMA;s and no mural apical thrombus..  Estimated ejection fraction was 60%.  Right sided cardiac chambers were normal with no evidence of pulmonary hypertension.  The pulmonary and tricuspid valves were structurally normal.  There was trivial TR The mitral valve was structurally normal with no mitral regurgitation.    The aortic valve was trileaflet with no AR/AS The aortic root was normal.    Imaging of the septum showed no ASD or VSD 2D and color flow confirmed no PFO  The LAE was well visualized in orthogonal views.  There was no spontaneous contrast and no thrombus.    The descending thoracic aorta had no mural aortic debris with no evidence of aneurysmal dilation or disection  Impression:  1) No SBE normal AV,MV,PV,TV 2) EF 60% 3) Normal RA/RV 4) Normal aorta 5) No ASD/PFO 6) No LAA thrombus 7) No SOE 8) No vegetatoins  Jenkins Rouge 07/29/2011 1:08 PM

## 2011-07-29 NOTE — Progress Notes (Signed)
Pt now ready for cpap/rest. Placed him on cpap at 7cm h2o per home settings with 2l o2 bleedin, hr 78, spo2 92%.  Pt is tolerating cpap well at this time.  RN aware, at bedside.

## 2011-07-29 NOTE — Progress Notes (Signed)
  Echocardiogram Echocardiogram Transesophageal has been performed.  Basilia Jumbo [facility] 07/29/2011, 2:45 PM

## 2011-07-30 LAB — CBC
Hemoglobin: 11.6 g/dL — ABNORMAL LOW (ref 13.0–17.0)
MCH: 29.3 pg (ref 26.0–34.0)
MCHC: 33.5 g/dL (ref 30.0–36.0)
Platelets: 326 10*3/uL (ref 150–400)
RBC: 3.96 MIL/uL — ABNORMAL LOW (ref 4.22–5.81)

## 2011-07-30 LAB — DIFFERENTIAL
Basophils Relative: 0 % (ref 0–1)
Eosinophils Relative: 2 % (ref 0–5)
Lymphocytes Relative: 14 % (ref 12–46)
Monocytes Relative: 5 % (ref 3–12)
Neutro Abs: 16.5 10*3/uL — ABNORMAL HIGH (ref 1.7–7.7)

## 2011-07-30 NOTE — Progress Notes (Signed)
Subjective: States that he feels much better today than he's felt since he's been here. The patient is ambulating the [place] without any difficulty at work with physical therapy today and was able to negotiate stairs without problems.  Interval history: Despite no other clinical signs of escalating infection the patient still has an elevated white blood cell count continues to rise.  Objective: Filed Vitals:   07/30/11 0500 07/30/11 0942 07/30/11 1429 07/30/11 1729  BP: 118/62 122/77 121/65 123/75  Pulse: 69 72 75 73  Temp: 98.4 F (36.9 C) 98.1 F (36.7 C) 97.9 F (36.6 C) 99 F (37.2 C)  TempSrc: Oral Oral Oral Oral  Resp: 18 18 18 18   Height:      Weight:      SpO2: 95% 96% 97% 99%   Weight change:   Intake/Output Summary (Last 24 hours) at 07/30/11 1915 Last data filed at 07/30/11 1800  Gross per 24 hour  Intake 2109.67 ml  Output   3250 ml  Net -1140.33 ml    General: Alert, awake, oriented x3, in no acute distress.  HEENT: [place]/AT PEERL, EOMI Neck: Trachea midline,  no masses, no thyromegal,y no JVD, no carotid bruit OROPHARYNX:  Moist, edentulous.  Heart: Regular rate and rhythm.  Lungs: Clear to auscultation, no wheezing or rhonchi noted. No increased vocal fremitus resonant to percussion  Abdomen: Soft, nontender, nondistended, positive bowel sounds, no masses no hepatosplenomegaly noted..  Musculoskeletal: Palpated around the patient's right hip both anteriorly and posteriorly and the patient has only very very minimal tenderness in the anterior groin area which she said is markedly improved from admission.    Lab Results: No results found for this basename: NA:2,K:2,CL:2,CO2:2,GLUCOSE:2,BUN:2,CREATININE:2,CALCIUM:2,MG:2,PHOS:2 in the last 72 hours No results found for this basename: AST:2,ALT:2,ALKPHOS:2,BILITOT:2,PROT:2,ALBUMIN:2 in the last 72 hours No results found for this basename: LIPASE:2,AMYLASE:2 in the last 72 hours  Basename 07/30/11 1020  WBC 20.8*    NEUTROABS 16.5*  HGB 11.6*  HCT 34.6*  MCV 87.4  PLT 326   No results found for this basename: CKTOTAL:3,CKMB:3,CKMBINDEX:3,TROPONINI:3 in the last 72 hours No components found with this basename: POCBNP:3 No results found for this basename: DDIMER:2 in the last 72 hours No results found for this basename: HGBA1C:2 in the last 72 hours No results found for this basename: CHOL:2,HDL:2,LDLCALC:2,TRIG:2,CHOLHDL:2,LDLDIRECT:2 in the last 72 hours No results found for this basename: TSH,T4TOTAL,FREET3,T3FREE,THYROIDAB in the last 72 hours No results found for this basename: VITAMINB12:2,FOLATE:2,FERRITIN:2,TIBC:2,IRON:2,RETICCTPCT:2 in the last 72 hours  Micro Results: Recent Results (from the past 240 hour(s))  CULTURE, BLOOD (ROUTINE X 2)     Status: Normal   Collection Time   07/24/11 12:06 AM      Component Value Range Status Comment   Specimen Description Blood   Final    Special Requests NONE   Final    Culture  Setup Time [id number]   Final    Culture     Final    Value: STAPHYLOCOCCUS AUREUS     Note: RIFAMPIN AND GENTAMICIN SHOULD NOT BE USED AS SINGLE DRUGS FOR TREATMENT OF STAPH INFECTIONS.     Note: Gram Stain Report Called to,Read Back By and Verified With: [name] 07/25/11 0850 BY [name]   Report Status 07/27/2011 FINAL   Final    Organism ID, Bacteria STAPHYLOCOCCUS AUREUS   Final   CULTURE, BLOOD (ROUTINE X 2)     Status: Normal   Collection Time   07/24/11 12:22 AM      Component Value Range  Status Comment   Specimen Description BLOOD LEFT ARM   Final    Special Requests BOTTLES DRAWN AEROBIC AND ANAEROBIC  5ML   Final    Culture  Setup Time [id number]   Final    Culture     Final    Value: STAPHYLOCOCCUS AUREUS     Note: SUSCEPTIBILITIES PERFORMED ON PREVIOUS CULTURE WITHIN THE LAST 5 DAYS.     Note: Gram Stain Report Called to,Read Back By and Verified With: [name] 07/25/11 0850 BY [name]   Report Status 07/27/2011 FINAL   Final   CULTURE,  BLOOD (ROUTINE X 2)     Status: Normal (Preliminary result)   Collection Time   07/24/11  7:21 PM      Component Value Range Status Comment   Specimen Description BLOOD LEFT ARM  10 ML IN [facility] BOTTLE   Final    Special Requests NONE   Final    Culture  Setup Time [id number]   Final    Culture     Final    Value:        BLOOD CULTURE RECEIVED NO GROWTH TO DATE CULTURE WILL BE HELD FOR 5 DAYS BEFORE ISSUING A FINAL NEGATIVE REPORT   Report Status PENDING   Incomplete   CULTURE, BLOOD (ROUTINE X 2)     Status: Normal (Preliminary result)   Collection Time   07/27/11  3:10 PM      Component Value Range Status Comment   Specimen Description BLOOD LEFT ARM   Final    Special Requests BOTTLES DRAWN AEROBIC AND ANAEROBIC 10CC   Final    Culture  Setup Time [id number]   Final    Culture     Final    Value:        BLOOD CULTURE RECEIVED NO GROWTH TO DATE CULTURE WILL BE HELD FOR 5 DAYS BEFORE ISSUING A FINAL NEGATIVE REPORT   Report Status PENDING   Incomplete     Studies/Results: Dg Orthopantogram  07/26/2011  *RADIOLOGY REPORT*  Clinical Data: Staph infection of the right hip.  Question periapical abscess.  ORTHOPANTOGRAM/PANORAMIC  Comparison: None.  Findings: The patient is nearly the edentulous.  Four maxillary teeth on the right are identified.  There are cavities in each of these teeth.  No definite periapical abscess is identified.  There is an elongated radiodense structure in the expected location of a lower left medial lower incisor which may represent a dental implant. There is periapical lucency about this structure.  No fracture is identified.  IMPRESSION:  1.  Possible left lower incisor dental implant has periapical lucency about it worrisome for abscess. 2.  Cavities are present in all maxillary teeth on the right.  Original Report Authenticated By: Arvid Right. Luther Parody, M.D.   Ct Abdomen Pelvis W Contrast  07/23/2011  *RADIOLOGY REPORT*  Clinical Data: Right lower quadrant/groin  pain, fever, prior hernia repair, evaluate for appendicitis  CT ABDOMEN AND PELVIS WITH CONTRAST  Technique:  Multidetector CT imaging of the abdomen and pelvis was performed following the standard protocol during bolus administration of intravenous contrast.  Contrast: 165mL OMNIPAQUE IOHEXOL 300 MG/ML  SOLN  Comparison: 09/02/2010  Findings: Mild dependent atelectasis at the lung bases.  Liver, spleen, pancreas, and adrenal glands are within normal limits.  Gallbladder is unremarkable.  No intrahepatic or extrahepatic ductal dilatation.  Kidneys are within normal limits.  No hydronephrosis.  No evidence of bowel obstruction.  Normal appendix.  Colonic diverticulosis, without associated inflammatory changes.  No evidence of abdominal aortic aneurysm.  No abdominopelvic ascites.  No suspicious abdominopelvic lymphadenopathy.  Prostate and bladder are partially obscured by streak artifact from the patient's bilateral hip prostheses.  Tiny fat-containing level hernia.  Suspected postsurgical changes in the right inguinal region.  Enlargement of the right iliacus muscle with associated 1.5 x 2.3 cm intramuscular hypodensity (series 2/image 66) and mild right retroperitoneal stranding (series 2/image 59).  Degenerative changes of the visualized thoracolumbar spine. Bilateral hip arthroplasties.  IMPRESSION: Enlargement of the right iliacus muscle with associated 1.5 x 2.3 intramuscular hypodensity and mild right retroperitoneal stranding. This appearance suggests a retroperitoneal hematoma or less likely intramuscular infection/abscess.  Normal appendix.  No evidence of bowel obstruction.  Original Report Authenticated By: Julian Hy, M.D.    Medications: I have reviewed the patient's current medications. Scheduled Meds:    . docusate sodium  50 mg Oral BID  . NONFORMULARY OR COMPOUNDED ITEM 1 each  1 each Mouth Rinse Q2H while awake  . polyethylene glycol  17 g Oral Daily  . rifampin  300 mg Oral Daily     Continuous Infusions:    . sodium chloride 50 mL/hr at 07/30/11 0517   PRN Meds:.acetaminophen, acetaminophen, alum & mag hydroxide-simeth, HYDROmorphone, NONFORMULARY OR COMPOUNDED ITEM 1 each, ondansetron (ZOFRAN) IV, ondansetron, oxyCODONE, sodium chloride, zolpidem Assessment/Plan: Patient Active Hospital Problem List: 1-Right iliacus myositis/abscess: at this point per GS recommendations no surgery indicated due to the size of abscess. Per ID recommendations the patient's will be on IV Ancef and by mouth rifampin for a total of 6 weeks of therapy.  2-GPC bacteremia: TEE negative for any vegetations.  3-Fever: due to # 1; Tmax for last 24 hrs 99.5.  Continue antibiotics.   4-OA: continue PRN pain meds.   5-Constipation due to pain meds; will continue bowel regimen   6-GERD: continue PPI   7-OSA: will continue CPAP.   8-DVT: SCD's   9-Poor dentition: Status post extraction of 11 teeth. Tolerating diet well.    LOS: 7 days

## 2011-07-30 NOTE — Evaluation (Signed)
Physical Therapy Evaluation Patient Details Name: Steve [name] MRN: [id number] DOB: [birth year] Today's Date: 07/30/2011 Time: [id number] PT Time Calculation (min): 17 min  PT Assessment / Plan / Recommendation Clinical Impression  Pt presents with staph infection and multiple teeth extraction with decreased mobility.  Pt tolerated ambulation and stair training very well and will not require any further PT services in hospital or at home.      PT Assessment  Patent does not need any further PT services    Follow Up Recommendations  No PT follow up    Equipment Recommendations  Rolling walker with 5" wheels (Will need RW if does not have one)    Frequency      Precautions / Restrictions Restrictions Weight Bearing Restrictions: No   Pertinent Vitals/Pain 6/10      Mobility  Bed Mobility Bed Mobility: Supine to Sit;Sit to Supine Supine to Sit: 5: Supervision;HOB elevated Sit to Supine: 5: Supervision;HOB elevated Details for Bed Mobility Assistance: Supervision for safety.  Transfers Transfers: Sit to Stand;Stand to Sit Sit to Stand: 5: Supervision;From elevated surface;With upper extremity assist;From bed Stand to Sit: 5: Supervision;With upper extremity assist;To bed;To elevated surface Details for Transfer Assistance: Supervision for safety with cues for hand placement.  Ambulation/Gait Ambulation/Gait Assistance: 5: Supervision Ambulation Distance (Feet): 400 Feet Assistive device: Rolling walker Ambulation/Gait Assistance Details: Cues for upright posture.  Gait Pattern: Trunk flexed;Decreased stride length Gait velocity: WFL Stairs: Yes Stairs Assistance: 4: Min assist Stairs Assistance Details (indicate cue type and reason): Cues for sequencing/technique with two hand rails.  Stair Management Technique: Two rails;Step to pattern Number of Stairs: 3  Wheelchair Mobility Wheelchair Mobility: No    Exercises     PT Goals    Visit Information  Last PT  Received On: 07/30/11 Assistance Needed: +1    Subjective Data  Subjective: "I'm doing much better" Patient Stated Goal: "I want to get back home"   Prior Functioning  Home Living Lives With: Spouse Available Help at Discharge: Family Type of Home: House Home Access: Stairs to enter Technical brewer of Steps: 3 Entrance Stairs-Rails: Can reach both;Right;Left Home Layout: One level Bathroom Shower/Tub: Chiropodist: Standard Home Adaptive Equipment: None Prior Function Level of Independence: Independent Able to Take Stairs?: Yes Driving: Yes Vocation: Full time employment Communication Communication: No difficulties    Cognition  Overall Cognitive Status: Appears within functional limits for tasks assessed/performed Arousal/Alertness: Awake/alert Orientation Level: Appears intact for tasks assessed Behavior During Session: [facility] for tasks performed    Extremity/Trunk Assessment Right Lower Extremity Assessment RLE ROM/Strength/Tone: WFL for tasks assessed RLE Sensation: WFL - Light Touch RLE Coordination: WFL - gross motor Left Lower Extremity Assessment LLE ROM/Strength/Tone: WFL for tasks assessed LLE Sensation: WFL - Light Touch LLE Coordination: WFL - gross motor Trunk Assessment Trunk Assessment: Normal   Balance    End of Session PT - End of Session Activity Tolerance: Patient tolerated treatment well Patient left: in bed;with call bell/phone within reach Nurse Communication: Mobility status   Page, Betha Loa 07/30/2011, 4:48 PM

## 2011-07-30 NOTE — Plan of Care (Signed)
Problem: Phase III Progression Outcomes Goal: Other Phase III Outcomes/Goals Outcome: Completed/Met Date Met:  07/30/11 Patient remained free from injury during the course of the shift, room intact and clutter free. Patient skin remained clean dry and intact during the course of the shift. Patients airway remained patent during the course of the shift.

## 2011-07-31 LAB — DIFFERENTIAL
Eosinophils Relative: 2 % (ref 0–5)
Lymphocytes Relative: 20 % (ref 12–46)
Lymphs Abs: 3.4 10*3/uL (ref 0.7–4.0)
Monocytes Relative: 0 % — ABNORMAL LOW (ref 3–12)
Neutro Abs: 13.1 10*3/uL — ABNORMAL HIGH (ref 1.7–7.7)

## 2011-07-31 LAB — CBC
HCT: 32.4 % — ABNORMAL LOW (ref 39.0–52.0)
Hemoglobin: 10.6 g/dL — ABNORMAL LOW (ref 13.0–17.0)
MCV: 88 fL (ref 78.0–100.0)
RBC: 3.68 MIL/uL — ABNORMAL LOW (ref 4.22–5.81)
WBC: 17 10*3/uL — ABNORMAL HIGH (ref 4.0–10.5)

## 2011-07-31 LAB — CULTURE, BLOOD (ROUTINE X 2)
Culture  Setup Time: 201304150806
Culture: NO GROWTH

## 2011-07-31 MED ORDER — CEFAZOLIN SODIUM 1-5 GM-% IV SOLN
1.0000 g | Freq: Three times a day (TID) | INTRAVENOUS | Status: DC
  Administered 2011-07-31 – 2011-08-01 (×3): 1 g via INTRAVENOUS
  Filled 2011-07-31 (×6): qty 50

## 2011-07-31 MED ORDER — CEFAZOLIN SODIUM 1-5 GM-% IV SOLN: 1.0000 g | Freq: Three times a day (TID) | INTRAVENOUS | Status: AC

## 2011-07-31 NOTE — Care Management Note (Signed)
Cm notified by Winfred Burn that IV ABX rx available. CM faxed copy of rx to [place] at [phone]. Original rx placed in shadow chart. Awaiting MD orders for [facility].   Arlean Hopping [phone]

## 2011-07-31 NOTE — Progress Notes (Addendum)
Cm spoke with pt concerning dc planning. Per RN phone call to CM, MD plans for pt dc Monday 08/01/11. Per pt choice [facility] to provide [facility] services. [facility] weekend rep Butch Penny notified of pt referral. H&P, demographics, progress notes faxed to [place] at [phone]. Pt's wife to assist in home care. Awaiting MD orders for [facility] and IV ABX RX to fax to [place].   Arlean Hopping [phone]

## 2011-07-31 NOTE — Progress Notes (Signed)
Spoke to case management per Dr. Rodena Piety instruction for setup of home antibiotics

## 2011-07-31 NOTE — Progress Notes (Signed)
Patient refuses to use nocturnal CPAP tonight. Equipment remains at bedside. RT to continue to follow.

## 2011-07-31 NOTE — Progress Notes (Signed)
Subjective: No new complaints today.   Interval history: Patient has had a slight decline in his white blood cell.  Objective: Filed Vitals:   07/30/11 1729 07/30/11 2054 07/31/11 0515 07/31/11 1401  BP: 123/75 132/73 125/75 103/66  Pulse: 73 71 71 70  Temp: 99 F (37.2 C) 98.9 F (37.2 C) 99.6 F (37.6 C) 98.4 F (36.9 C)  TempSrc: Oral Oral Oral Oral  Resp: 18 18 18 18   Height:      Weight:      SpO2: 99% 97% 94% 96%   Weight change:   Intake/Output Summary (Last 24 hours) at 07/31/11 1931 Last data filed at 07/31/11 1821  Gross per 24 hour  Intake   2160 ml  Output   2000 ml  Net    160 ml    General: Alert, awake, oriented x3, in no acute distress.  HEENT: Gam[place]/AT PEERL, EOMI Neck: Trachea midline,  no masses, no thyromegal,y no JVD, no carotid bruit OROPHARYNX:  Moist, edentulous.  Heart: Regular rate and rhythm.  Lungs: Clear to auscultation, no wheezing or rhonchi noted. No increased vocal fremitus resonant to percussion  Abdomen: Soft, nontender, nondistended, positive bowel sounds, no masses no hepatosplenomegaly noted..  Musculoskeletal: Palpated around the patient's right hip both anteriorly and posteriorly and the patient has only very very minimal tenderness in the anterior groin area which she said is markedly improved from admission.    Lab Results: No results found for this basename: NA:2,K:2,CL:2,CO2:2,GLUCOSE:2,BUN:2,CREATININE:2,CALCIUM:2,MG:2,PHOS:2 in the last 72 hours No results found for this basename: AST:2,ALT:2,ALKPHOS:2,BILITOT:2,PROT:2,ALBUMIN:2 in the last 72 hours No results found for this basename: LIPASE:2,AMYLASE:2 in the last 72 hours  Basename 07/31/11 0455 07/30/11 1020  WBC 17.0* 20.8*  NEUTROABS 13.1* 16.5*  HGB 10.6* 11.6*  HCT 32.4* 34.6*  MCV 88.0 87.4  PLT 325 326   No results found for this basename: CKTOTAL:3,CKMB:3,CKMBINDEX:3,TROPONINI:3 in the last 72 hours No components found with this basename: POCBNP:3 No results  found for this basename: DDIMER:2 in the last 72 hours No results found for this basename: HGBA1C:2 in the last 72 hours No results found for this basename: CHOL:2,HDL:2,LDLCALC:2,TRIG:2,CHOLHDL:2,LDLDIRECT:2 in the last 72 hours No results found for this basename: TSH,T4TOTAL,FREET3,T3FREE,THYROIDAB in the last 72 hours No results found for this basename: VITAMINB12:2,FOLATE:2,FERRITIN:2,TIBC:2,IRON:2,RETICCTPCT:2 in the last 72 hours  Micro Results: Recent Results (from the past 240 hour(s))  CULTURE, BLOOD (ROUTINE X 2)     Status: Normal   Collection Time   07/24/11 12:06 AM      Component Value Range Status Comment   Specimen Description Blood   Final    Special Requests NONE   Final    Culture  Setup Time [id number]   Final    Culture     Final    Value: STAPHYLOCOCCUS AUREUS     Note: RIFAMPIN AND GENTAMICIN SHOULD NOT BE USED AS SINGLE DRUGS FOR TREATMENT OF STAPH INFECTIONS.     Note: Gram Stain Report Called to,Read Back By and Verified With: [name] 07/25/11 0850 BY [name]   Report Status 07/27/2011 FINAL   Final    Organism ID, Bacteria STAPHYLOCOCCUS AUREUS   Final   CULTURE, BLOOD (ROUTINE X 2)     Status: Normal   Collection Time   07/24/11 12:22 AM      Component Value Range Status Comment   Specimen Description BLOOD LEFT ARM   Final    Special Requests BOTTLES DRAWN AEROBIC AND ANAEROBIC  5ML   Final  Culture  Setup Time [id number]   Final    Culture     Final    Value: STAPHYLOCOCCUS AUREUS     Note: SUSCEPTIBILITIES PERFORMED ON PREVIOUS CULTURE WITHIN THE LAST 5 DAYS.     Note: Gram Stain Report Called to,Read Back By and Verified With: [name] 07/25/11 0850 BY [name]   Report Status 07/27/2011 FINAL   Final   CULTURE, BLOOD (ROUTINE X 2)     Status: Normal   Collection Time   07/24/11  7:21 PM      Component Value Range Status Comment   Specimen Description BLOOD LEFT ARM  10 ML IN [facility] BOTTLE   Final    Special Requests NONE   Final     Culture  Setup Time [id number]   Final    Culture NO GROWTH 5 DAYS   Final    Report Status 07/31/2011 FINAL   Final   CULTURE, BLOOD (ROUTINE X 2)     Status: Normal (Preliminary result)   Collection Time   07/27/11  3:10 PM      Component Value Range Status Comment   Specimen Description BLOOD LEFT ARM   Final    Special Requests BOTTLES DRAWN AEROBIC AND ANAEROBIC 10CC   Final    Culture  Setup Time [id number]   Final    Culture     Final    Value:        BLOOD CULTURE RECEIVED NO GROWTH TO DATE CULTURE WILL BE HELD FOR 5 DAYS BEFORE ISSUING A FINAL NEGATIVE REPORT   Report Status PENDING   Incomplete     Studies/Results: Dg Orthopantogram  07/26/2011  *RADIOLOGY REPORT*  Clinical Data: Staph infection of the right hip.  Question periapical abscess.  ORTHOPANTOGRAM/PANORAMIC  Comparison: None.  Findings: The patient is nearly the edentulous.  Four maxillary teeth on the right are identified.  There are cavities in each of these teeth.  No definite periapical abscess is identified.  There is an elongated radiodense structure in the expected location of a lower left medial lower incisor which may represent a dental implant. There is periapical lucency about this structure.  No fracture is identified.  IMPRESSION:  1.  Possible left lower incisor dental implant has periapical lucency about it worrisome for abscess. 2.  Cavities are present in all maxillary teeth on the right.  Original Report Authenticated By: Arvid Right. Luther Parody, M.D.   Ct Abdomen Pelvis W Contrast  07/23/2011  *RADIOLOGY REPORT*  Clinical Data: Right lower quadrant/groin pain, fever, prior hernia repair, evaluate for appendicitis  CT ABDOMEN AND PELVIS WITH CONTRAST  Technique:  Multidetector CT imaging of the abdomen and pelvis was performed following the standard protocol during bolus administration of intravenous contrast.  Contrast: 164mL OMNIPAQUE IOHEXOL 300 MG/ML  SOLN  Comparison: 09/02/2010  Findings: Mild dependent  atelectasis at the lung bases.  Liver, spleen, pancreas, and adrenal glands are within normal limits.  Gallbladder is unremarkable.  No intrahepatic or extrahepatic ductal dilatation.  Kidneys are within normal limits.  No hydronephrosis.  No evidence of bowel obstruction.  Normal appendix.  Colonic diverticulosis, without associated inflammatory changes.  No evidence of abdominal aortic aneurysm.  No abdominopelvic ascites.  No suspicious abdominopelvic lymphadenopathy.  Prostate and bladder are partially obscured by streak artifact from the patient's bilateral hip prostheses.  Tiny fat-containing level hernia.  Suspected postsurgical changes in the right inguinal region.  Enlargement of the right iliacus muscle with associated 1.5 x 2.3 cm  intramuscular hypodensity (series 2/image 66) and mild right retroperitoneal stranding (series 2/image 59).  Degenerative changes of the visualized thoracolumbar spine. Bilateral hip arthroplasties.  IMPRESSION: Enlargement of the right iliacus muscle with associated 1.5 x 2.3 intramuscular hypodensity and mild right retroperitoneal stranding. This appearance suggests a retroperitoneal hematoma or less likely intramuscular infection/abscess.  Normal appendix.  No evidence of bowel obstruction.  Original Report Authenticated By: Julian Hy, M.D.    Medications: I have reviewed the patient's current medications. Scheduled Meds:    .  ceFAZolin (ANCEF) IV  1 g Intravenous Q8H  . docusate sodium  50 mg Oral BID  . NONFORMULARY OR COMPOUNDED ITEM 1 each  1 each Mouth Rinse Q2H while awake  . polyethylene glycol  17 g Oral Daily  . rifampin  300 mg Oral Daily   Continuous Infusions:    . sodium chloride 50 mL/hr at 07/31/11 0600   PRN Meds:.acetaminophen, acetaminophen, alum & mag hydroxide-simeth, HYDROmorphone, NONFORMULARY OR COMPOUNDED ITEM 1 each, ondansetron (ZOFRAN) IV, ondansetron, oxyCODONE, sodium chloride, zolpidem Assessment/Plan: Patient Active  Hospital Problem List: 1-Right iliacus myositis/abscess: Per ID recommendations the patient's will be on IV Ancef and by mouth rifampin for a total of 6 weeks of therapy.  2-GPC bacteremia: TEE negative for any vegetations.  3-Fever: due to # 1; Tmax for last 24 hrs 99.5.  Continue antibiotics.   4-OA: continue PRN pain meds.   5-Constipation : Resolved   6-GERD: continue PPI   7-OSA: will continue CPAP.   8-DVT: SCD's   9-Poor dentition: Status post extraction of 11 teeth. Tolerating diet well.  Anticipate discharge home tomorrow with IV antibiotics  LOS: 8 days

## 2011-08-01 ENCOUNTER — Encounter (HOSPITAL_COMMUNITY): Payer: Self-pay | Admitting: Cardiovascular Disease

## 2011-08-01 DIAGNOSIS — B958 Unspecified staphylococcus as the cause of diseases classified elsewhere: Secondary | ICD-10-CM

## 2011-08-01 DIAGNOSIS — R7881 Bacteremia: Secondary | ICD-10-CM

## 2011-08-01 LAB — CBC
HCT: 32.5 % — ABNORMAL LOW (ref 39.0–52.0)
Hemoglobin: 10.8 g/dL — ABNORMAL LOW (ref 13.0–17.0)
MCH: 29 pg (ref 26.0–34.0)
MCHC: 33.2 g/dL (ref 30.0–36.0)
RBC: 3.72 MIL/uL — ABNORMAL LOW (ref 4.22–5.81)

## 2011-08-01 LAB — DIFFERENTIAL
Basophils Relative: 0 % (ref 0–1)
Eosinophils Absolute: 0.3 10*3/uL (ref 0.0–0.7)
Eosinophils Relative: 2 % (ref 0–5)
Lymphocytes Relative: 17 % (ref 12–46)
Neutro Abs: 11.5 10*3/uL — ABNORMAL HIGH (ref 1.7–7.7)

## 2011-08-01 MED ORDER — RIFAMPIN 300 MG PO CAPS: 300.0000 mg | ORAL_CAPSULE | Freq: Every day | ORAL | Status: AC

## 2011-08-01 MED ORDER — HEPARIN SOD (PORK) LOCK FLUSH 100 UNIT/ML IV SOLN
250.0000 [IU] | INTRAVENOUS | Status: AC | PRN
  Administered 2011-08-01: 250 [IU]

## 2011-08-01 MED FILL — Sodium Chloride Irrigation Soln 0.9%: Qty: 2000 | Status: CN

## 2011-08-01 MED FILL — Sodium Chloride Irrigation Soln 0.9%: Qty: 1000 | Status: CN

## 2011-08-01 NOTE — Progress Notes (Signed)
INFECTIOUS DISEASE PROGRESS NOTE  ID: Steve [name] is a 55 y.o. male with  Active Problems:  Staphylococcus aureus bacteremia  Subjective: Has ambulated. Hip pain feels better.   Abtx:  Anti-infectives     Start     Dose/Rate Route Frequency Ordered Stop   08/01/11 0000   rifampin (RIFADIN) 300 MG capsule        300 mg Oral Daily 08/01/11 0830 08/11/11 2359   07/31/11 1700   ceFAZolin (ANCEF) IVPB 1 g/50 mL premix        1 g 100 mL/hr over 30 Minutes Intravenous Every 8 hours 07/31/11 1606     07/31/11 0000   ceFAZolin (ANCEF) 1-5 GM-%        1 g 100 mL/hr over 30 Minutes Intravenous Every 8 hours 07/31/11 1608 09/11/11 2359   07/26/11 1300   rifampin (RIFADIN) capsule 300 mg        300 mg Oral Daily 07/26/11 1146     07/25/11 2359   vancomycin (VANCOCIN) 1,250 mg in sodium chloride 0.9 % 250 mL IVPB  Status:  Discontinued        1,250 mg 166.7 mL/hr over 90 Minutes Intravenous Every 8 hours 07/25/11 1743 07/27/11 1440   07/24/11 0900   vancomycin (VANCOCIN) IVPB 1000 mg/200 mL premix  Status:  Discontinued        1,000 mg 200 mL/hr over 60 Minutes Intravenous Every 8 hours 07/24/11 0140 07/25/11 1742   07/24/11 0100   vancomycin (VANCOCIN) IVPB 1000 mg/200 mL premix        1,000 mg 200 mL/hr over 60 Minutes Intravenous  Once 07/24/11 0052 07/24/11 0226          Medications:  Scheduled:   .  ceFAZolin (ANCEF) IV  1 g Intravenous Q8H  . docusate sodium  50 mg Oral BID  . NONFORMULARY OR COMPOUNDED ITEM 1 each  1 each Mouth Rinse Q2H while awake  . polyethylene glycol  17 g Oral Daily  . rifampin  300 mg Oral Daily    Objective: Vital signs in last 24 hours: Temp:  [98.1 F (36.7 C)-99.2 F (37.3 C)] 98.1 F (36.7 C) (04/22 0542) Pulse Rate:  [67-74] 67  (04/22 0542) Resp:  [18] 18  (04/22 0542) BP: (122-125)/(72-79) 125/72 mmHg (04/22 0542) SpO2:  [95 %-96 %] 95 % (04/22 0542)   General appearance: alert, cooperative and no distress Resp: clear to  auscultation bilaterally Cardio: regular rate and rhythm GI: normal findings: bowel sounds normal and soft, non-tender Extremities: LUE PIC clean  Lab Results  Basename 08/01/11 0450 07/31/11 0455  WBC 15.2* 17.0*  HGB 10.8* 10.6*  HCT 32.5* 32.4*  NA -- --  K -- --  CL -- --  CO2 -- --  BUN -- --  CREATININE -- --  GLU -- --   Liver Panel No results found for this basename: PROT:2,ALBUMIN:2,AST:2,ALT:2,ALKPHOS:2,BILITOT:2,BILIDIR:2,IBILI:2 in the last 72 hours Sedimentation Rate No results found for this basename: ESRSEDRATE in the last 72 hours C-Reactive Protein No results found for this basename: CRP:2 in the last 72 hours  Microbiology: Recent Results (from the past 240 hour(s))  CULTURE, BLOOD (ROUTINE X 2)     Status: Normal   Collection Time   07/24/11 12:06 AM      Component Value Range Status Comment   Specimen Description Blood   Final    Special Requests NONE   Final    Culture  Setup Time [id number]   Final  Culture     Final    Value: STAPHYLOCOCCUS AUREUS     Note: RIFAMPIN AND GENTAMICIN SHOULD NOT BE USED AS SINGLE DRUGS FOR TREATMENT OF STAPH INFECTIONS.     Note: Gram Stain Report Called to,Read Back By and Verified With: Steve [name] 07/25/11 0850 BY Steve [name]   Report Status 07/27/2011 FINAL   Final    Organism ID, Bacteria STAPHYLOCOCCUS AUREUS   Final   CULTURE, BLOOD (ROUTINE X 2)     Status: Normal   Collection Time   07/24/11 12:22 AM      Component Value Range Status Comment   Specimen Description BLOOD LEFT ARM   Final    Special Requests BOTTLES DRAWN AEROBIC AND ANAEROBIC  5ML   Final    Culture  Setup Time [id number]   Final    Culture     Final    Value: STAPHYLOCOCCUS AUREUS     Note: SUSCEPTIBILITIES PERFORMED ON PREVIOUS CULTURE WITHIN THE LAST 5 DAYS.     Note: Gram Stain Report Called to,Read Back By and Verified With: Steve [name] 07/25/11 0850 BY Steve [name]   Report Status 07/27/2011 FINAL   Final   CULTURE, BLOOD (ROUTINE X  2)     Status: Normal   Collection Time   07/24/11  7:21 PM      Component Value Range Status Comment   Specimen Description BLOOD LEFT ARM  10 ML IN [facility] BOTTLE   Final    Special Requests NONE   Final    Culture  Setup Time [id number]   Final    Culture NO GROWTH 5 DAYS   Final    Report Status 07/31/2011 FINAL   Final   CULTURE, BLOOD (ROUTINE X 2)     Status: Normal (Preliminary result)   Collection Time   07/27/11  3:10 PM      Component Value Range Status Comment   Specimen Description BLOOD LEFT ARM   Final    Special Requests BOTTLES DRAWN AEROBIC AND ANAEROBIC 10CC   Final    Culture  Setup Time [id number]   Final    Culture     Final    Value:        BLOOD CULTURE RECEIVED NO GROWTH TO DATE CULTURE WILL BE HELD FOR 5 DAYS BEFORE ISSUING A FINAL NEGATIVE REPORT   Report Status PENDING   Incomplete     Studies/Results: No results found.   Assessment/Plan: MSSA bacteremia  Iliacus abscess  R THR  Poor dentition- s/p extraction of 11 teeth this AM.  Day 9 anbx (ancef/rifampin)  TTE (-) and TEE (-)  Repeat BCx (4-17) NGTD  My appreciation to Dr Steve [name] for correcting his anbx Would plan for him to go home with 6 weeks of IV ancef and po rifampin. Would be glad to see him in ID clinic in f/u in 4 weeks- I have given him my card and will ask our clinic mgr to call him as well.  He may need f/u imaging of his abscess and hip to assure resolution.    Steve [name] Infectious Diseases [id number] 08/01/2011, 2:38 PM   LOS: 9 days

## 2011-08-01 NOTE — Progress Notes (Signed)
POST OPERATIVE NOTE:  08/01/2011 Mariea Stable [place] [id number]  VITALS: BP 125/72  Pulse 67  Temp(Src) 98.1 F (36.7 C) (Oral)  Resp 18  Ht 5' 6.5" (1.689 m)  Wt 205 lb 7.5 oz (93.2 kg)  BMI 32.67 kg/m2  SpO2 95%  Steve [name] is status post extraction of remaining teeth with alveoloplasty and pre-prosthetic surgery on 07/28/2011. Patient currently denies acute dental pain coming from the extraction sites. Most sutures are in place by report. Patient is being discharged today.  EXAM: There is no sign of infection, heme, or ooze. Sutures are loosely intact. Patient would generalized primary closure. Extraction site area #22 is healing in by secondary intention due to the periodontal defect associated with tooth #22 on the lingual aspect.  ASSESSMENT: Post operative course is consistent with dental procedures performed in the operating room on 07/28/2011.   PLAN: 1. Continue salt water rinses every 2 hours while awake. 2. Return to clinic for evaluation for suture removal on 08/08/2011 at 10:30 AM in the dental medicine clinic. 3. Call if acute problems arise in the interim.   Lenn Cal, DDS

## 2011-08-01 NOTE — Discharge Summary (Signed)
Steve [name] MRN: [id number] DOB/AGE: 1955/07/26 56 y.o.  Admit date: 07/23/2011 Discharge date: 08/01/2011  Primary Care Physician:  Annye Asa, MD, MD   Discharge Diagnoses:   Patient Active Problem List  Diagnoses  . HYPOGONADISM, MALE  . IMPAIRED GLUCOSE TOLERANCE  . SLEEP APNEA  . Abdominal pain, RLQ  . Elevated BP  . Staphylococcus aureus bacteremia    DISCHARGE MEDICATION: Medication List  As of 08/01/2011 12:50 PM   STOP taking these medications         ibuprofen 400 MG tablet         TAKE these medications         ceFAZolin 1-5 GM-%   Commonly known as: ANCEF   Inject 50 mLs (1 g total) into the vein every 8 (eight) hours.      HYDROcodone-acetaminophen 5-500 MG per tablet   Commonly known as: VICODIN   Take 1 tablet by mouth every 4 (four) hours as needed for pain.      rifampin 300 MG capsule   Commonly known as: RIFADIN   Take 1 capsule (300 mg total) by mouth daily.              Consults:     SIGNIFICANT DIAGNOSTIC STUDIES:  Dg Orthopantogram  07/26/2011  *RADIOLOGY REPORT*  Clinical Data: Staph infection of the right hip.  Question periapical abscess.  ORTHOPANTOGRAM/PANORAMIC  Comparison: None.  Findings: The patient is nearly the edentulous.  Four maxillary teeth on the right are identified.  There are cavities in each of these teeth.  No definite periapical abscess is identified.  There is an elongated radiodense structure in the expected location of a lower left medial lower incisor which may represent a dental implant. There is periapical lucency about this structure.  No fracture is identified.  IMPRESSION:  1.  Possible left lower incisor dental implant has periapical lucency about it worrisome for abscess. 2.  Cavities are present in all maxillary teeth on the right.  Original Report Authenticated By: Arvid Right. Luther Parody, M.D.   Ct Abdomen Pelvis W Contrast  07/23/2011  *RADIOLOGY REPORT*  Clinical Data: Right lower quadrant/groin  pain, fever, prior hernia repair, evaluate for appendicitis  CT ABDOMEN AND PELVIS WITH CONTRAST  Technique:  Multidetector CT imaging of the abdomen and pelvis was performed following the standard protocol during bolus administration of intravenous contrast.  Contrast: 130mL OMNIPAQUE IOHEXOL 300 MG/ML  SOLN  Comparison: 09/02/2010  Findings: Mild dependent atelectasis at the lung bases.  Liver, spleen, pancreas, and adrenal glands are within normal limits.  Gallbladder is unremarkable.  No intrahepatic or extrahepatic ductal dilatation.  Kidneys are within normal limits.  No hydronephrosis.  No evidence of bowel obstruction.  Normal appendix.  Colonic diverticulosis, without associated inflammatory changes.  No evidence of abdominal aortic aneurysm.  No abdominopelvic ascites.  No suspicious abdominopelvic lymphadenopathy.  Prostate and bladder are partially obscured by streak artifact from the patient's bilateral hip prostheses.  Tiny fat-containing level hernia.  Suspected postsurgical changes in the right inguinal region.  Enlargement of the right iliacus muscle with associated 1.5 x 2.3 cm intramuscular hypodensity (series 2/image 66) and mild right retroperitoneal stranding (series 2/image 59).  Degenerative changes of the visualized thoracolumbar spine. Bilateral hip arthroplasties.  IMPRESSION: Enlargement of the right iliacus muscle with associated 1.5 x 2.3 intramuscular hypodensity and mild right retroperitoneal stranding. This appearance suggests a retroperitoneal hematoma or less likely intramuscular infection/abscess.  Normal appendix.  No evidence of bowel obstruction.  Original Report Authenticated By: Julian Hy, M.D.     ECHO: - Left ventricle: The cavity size was normal. Systolic function was normal. The estimated ejection fraction was in the range of 55% to 65%. Wall motion was normal; there were no regional wall motion abnormalities. Doppler parameters are consistent with  abnormal left ventricular relaxation (grade 1 diastolic dysfunction). - Aortic valve: Thickening. No evidence of vegetation. - Mitral valve: No evidence of vegetation. - Tricuspid valve: No evidence of vegetation. Impressions:  - Vegetation cannot be excluded, but no gross valvular lesions noted.         Recent Results (from the past 240 hour(s))  CULTURE, BLOOD (ROUTINE X 2)     Status: Normal   Collection Time   07/24/11 12:06 AM      Component Value Range Status Comment   Specimen Description Blood   Final    Special Requests NONE   Final    Culture  Setup Time [id number]   Final    Culture     Final    Value: STAPHYLOCOCCUS AUREUS     Note: RIFAMPIN AND GENTAMICIN SHOULD NOT BE USED AS SINGLE DRUGS FOR TREATMENT OF STAPH INFECTIONS.     Note: Gram Stain Report Called to,Read Back By and Verified With: [name] 07/25/11 0850 BY [name]   Report Status 07/27/2011 FINAL   Final    Organism ID, Bacteria STAPHYLOCOCCUS AUREUS   Final   CULTURE, BLOOD (ROUTINE X 2)     Status: Normal   Collection Time   07/24/11 12:22 AM      Component Value Range Status Comment   Specimen Description BLOOD LEFT ARM   Final    Special Requests BOTTLES DRAWN AEROBIC AND ANAEROBIC  5ML   Final    Culture  Setup Time [id number]   Final    Culture     Final    Value: STAPHYLOCOCCUS AUREUS     Note: SUSCEPTIBILITIES PERFORMED ON PREVIOUS CULTURE WITHIN THE LAST 5 DAYS.     Note: Gram Stain Report Called to,Read Back By and Verified With: [name] 07/25/11 0850 BY [name]   Report Status 07/27/2011 FINAL   Final   CULTURE, BLOOD (ROUTINE X 2)     Status: Normal   Collection Time   07/24/11  7:21 PM      Component Value Range Status Comment   Specimen Description BLOOD LEFT ARM  10 ML IN [facility] BOTTLE   Final    Special Requests NONE   Final    Culture  Setup Time [id number]   Final    Culture NO GROWTH 5 DAYS   Final    Report Status 07/31/2011 FINAL   Final   CULTURE, BLOOD  (ROUTINE X 2)     Status: Normal (Preliminary result)   Collection Time   07/27/11  3:10 PM      Component Value Range Status Comment   Specimen Description BLOOD LEFT ARM   Final    Special Requests BOTTLES DRAWN AEROBIC AND ANAEROBIC 10CC   Final    Culture  Setup Time [id number]   Final    Culture     Final    Value:        BLOOD CULTURE RECEIVED NO GROWTH TO DATE CULTURE WILL BE HELD FOR 5 DAYS BEFORE ISSUING A FINAL NEGATIVE REPORT   Report Status PENDING   Incomplete     BRIEF ADMITTING H & P: Steve [name] is an 56 y.o. male  who has compliant of 4 days of worsening pain in the right hip. He reports during the past 24 hours he was unable to bear weight on his right leg and had difficulty walking due to pain. He denies fevers or chills. He has a history of a THR in the remote past, and he denies any trauma. In the ED, a CT scan was done and revealed an abscess of the Illiacus muscle. He was placed on IV Vancomycin and referred for admission. General Surgery On call had been contacted by the EDP and reviewed the CT scan, and the size of the Abscess was nonsurgical at this time.   Hospital Course:  Present on Admission:  .MSSA Bacteremia: Pt was found to have MSSA Bacteremia of unknown source.. He had a TTE and TEE which showed no vegetations. Infectious disease (ID) was consulted and the have recommended a 6 week course of Ancef.   .Iliacus Abscess: It was felt that the size of the abscess made it amenable to medical management. Pt was seen in consultation with ID (Dr. Johnnye Sima) who recommended IV vancomycin. This was changed to oral Rifampin and ancef to complete a total of 6 weeks. Pt will follow up with Dr. Johnnye Sima in the clinic in 2 weeks.   .Chronic apical periodontitis: pt was seen in consult by Dr. Enrique Sack and evaluation revealed a need for extraction of 11 teeth. Pt underwent edentulation of 11 teeth and is to follow up with Dr. Enrique Sack as directed. Instructions at time of  discharge are: 1. Continue salt water rinses every 2 hours while awake.  2. Return to clinic for evaluation for suture removal on 08/08/2011 at 10:30 AM in the dental medicine clinic.  3. Call if acute problems arise in the interim.  .Constipation: pt was treated with Mirilax with good results.  .Dental caries: see above  Disposition and Follow-up:   F/U with Dr. Enrique Sack on 08/08/2011 @ 10:30am to have sutures removed.  F/U with PMD Dr. Birdie Riddle in one week. Call Dr. Algis Downs office @[phone] to schedule an appointment in 2 weeks.   DISCHARGE EXAM:  General: Alert, awake, oriented x3, in no acute distress. Vital Signs:Blood pressure 125/72, pulse 67, temperature 98.1 F (36.7 C), temperature source Oral, resp. rate 18, height 5' 6.5" (1.689 m), weight 93.2 kg (205 lb 7.5 oz), SpO2 95.00%. HEENT: [place]/AT PEERL, EOMI  Neck: Trachea midline, no masses, no thyromegal,y no JVD, no carotid bruit  OROPHARYNX: Moist, edentulous.  Heart: Regular rate and rhythm.  Lungs: Clear to auscultation, no wheezing or rhonchi noted. No increased vocal fremitus resonant to percussion  Abdomen: Soft, nontender, nondistended, positive bowel sounds, no masses no hepatosplenomegaly noted..  Musculoskeletal: Palpated around the patient's right hip both anteriorly and posteriorly and the patient has only very very minimal tenderness in the anterior groin area which she said is markedly improved from admission    [facility] 08/01/11 0450 07/31/11 0455  WBC 15.2* 17.0*  NEUTROABS 11.5* 13.1*  HGB 10.8* 10.6*  HCT 32.5* 32.4*  MCV 87.4 88.0  PLT 320 325   Total time for discharge process including face-to-face time approximately 40 minutes. Signed: Draedyn Weidinger A. 08/01/2011, 12:50 PM

## 2011-08-03 LAB — CULTURE, BLOOD (ROUTINE X 2)

## 2011-08-08 ENCOUNTER — Encounter (HOSPITAL_COMMUNITY): Payer: Self-pay | Admitting: Dentistry

## 2011-08-08 ENCOUNTER — Ambulatory Visit (HOSPITAL_COMMUNITY): Payer: Self-pay | Admitting: Dentistry

## 2011-08-08 VITALS — BP 109/68 | HR 68 | Temp 97.2°F

## 2011-08-08 DIAGNOSIS — K08109 Complete loss of teeth, unspecified cause, unspecified class: Secondary | ICD-10-CM

## 2011-08-08 DIAGNOSIS — K08199 Complete loss of teeth due to other specified cause, unspecified class: Secondary | ICD-10-CM

## 2011-08-08 NOTE — Progress Notes (Signed)
POST OPERATIVE NOTE:  08/08/2011 Steve [name] [id number]  VITALS: BP 109/68  Pulse 68  Temp(Src) 97.2 F (36.2 C) (Oral)  Patient is status post  extraction remaining teeth with alveoloplasty on 07/28/2011. Patient now presents for periodic oral examination and evaluation for suture removal.  SUBJECTIVE: Patient without dental pain. Patient states he may have" one stitch".  EXAM: No sign of infection, heme, or ooze. One suture remains there was removed without complication. Patient with generalized primary closure. Patient is now edentulous. Patient has atrophy of the edentulous alveolar ridges.  ASSESSMENT: 1. Post operative course is consistent with dental procedures performed in the operating room on 07/28/2011. 2. Patient with less than ideal prognosis for successful upper lower complete denture fabrication due to lack of bone remaining.  PLAN: 1. Patient to continue salt water rinses as needed. 2. Patient to follow up with  the dentist of his choice for fabrication of upper lower complete dentures after adequate healing. 3. Patient to sign release of information if records need to be sent to another dentist.   Lenn Cal, DDS

## 2011-08-09 ENCOUNTER — Telehealth: Payer: Self-pay | Admitting: *Deleted

## 2011-08-09 NOTE — Telephone Encounter (Signed)
FYI: Pt walked into office advising that he has a hospital follow up with MD Birdie Riddle tomorrow and also has a note to attend jury duty tomorrow 08-10-11,as well, pt advised he has just been released from the hospital per surgery noted on 07-29-11, pt has discharge papers noting that he receives ABT via picc line every 8 hours, MD Tabori out of office, signed excuse note from MD Linna Darner in absence of MD Birdie Riddle was given to pt and he will be back in for his apt tomorrow,

## 2011-08-09 NOTE — Telephone Encounter (Signed)
Noted.  Agree w/ jury duty excuse

## 2011-08-10 ENCOUNTER — Ambulatory Visit (INDEPENDENT_AMBULATORY_CARE_PROVIDER_SITE_OTHER): Payer: BC Managed Care – PPO | Admitting: Family Medicine

## 2011-08-10 ENCOUNTER — Encounter: Payer: Self-pay | Admitting: Family Medicine

## 2011-08-10 VITALS — BP 128/75 | HR 72 | Temp 99.0°F | Ht 65.75 in | Wt 193.2 lb

## 2011-08-10 DIAGNOSIS — Z9889 Other specified postprocedural states: Secondary | ICD-10-CM

## 2011-08-10 DIAGNOSIS — M60009 Infective myositis, unspecified site: Secondary | ICD-10-CM

## 2011-08-10 DIAGNOSIS — A4901 Methicillin susceptible Staphylococcus aureus infection, unspecified site: Secondary | ICD-10-CM

## 2011-08-10 DIAGNOSIS — Z8719 Personal history of other diseases of the digestive system: Secondary | ICD-10-CM | POA: Insufficient documentation

## 2011-08-10 DIAGNOSIS — Z95828 Presence of other vascular implants and grafts: Secondary | ICD-10-CM | POA: Insufficient documentation

## 2011-08-10 DIAGNOSIS — M629 Disorder of muscle, unspecified: Secondary | ICD-10-CM

## 2011-08-10 DIAGNOSIS — R7881 Bacteremia: Secondary | ICD-10-CM

## 2011-08-10 HISTORY — DX: Infective myositis, unspecified site: M60.009

## 2011-08-10 NOTE — Patient Instructions (Signed)
Follow up w/ Dr Johnnye Sima as scheduled Call your [place] about changing the PICC line dressing I'll determine if we need to have follow up labs drawn Call with any questions or concerns Hang in there!!!

## 2011-08-10 NOTE — Progress Notes (Signed)
  Subjective:    Patient ID: Steve [name], male    DOB: 1956/02/26, 56 y.o.   MRN: [id number]  [facility] f/u- went to ER on 4/13 w/ groin pain/inability to walk, temp to 103.9, and was found to have iliacus abscess w/ staph bacteremia.  Also had multiple teeth abscesses w/ subsequent extraction of 11 teeth w/ Dr Enrique Sack.  D/c'd on 4/22 and is to have 6 weeks of Ancef and Rifampin.  Has PICC line in place in L upper arm.  Has appt w/ Dr Johnnye Sima on 5/22.  Reports mouth is feeling 'pretty good'.  Reports he is still feeling 'weak'.  Mild pain in hip, no longer requiring cane to walk.  Has 2 yr old daughter at home.  Wife needs FMLA as caregiver, pt needs disability paperwork.  Pt was told not to return to work until abx course complete- sometime in June.  Pt is currently on Heparin injxns for the PICC line and works around metal so this would be unsafe.   Review of Systems For ROS see HPI     Objective:   Physical Exam  Vitals reviewed. Constitutional: He is oriented to person, place, and time. He appears well-developed and well-nourished. No distress.  HENT:  Head: Normocephalic and atraumatic.  Mouth/Throat: Oropharynx is clear and moist. No oropharyngeal exudate.       edentulous  Eyes: Conjunctivae and EOM are normal. Pupils are equal, round, and reactive to light.  Neck: Normal range of motion. Neck supple.  Cardiovascular: Normal rate, regular rhythm, normal heart sounds and intact distal pulses.   No murmur heard. Pulmonary/Chest: Effort normal and breath sounds normal. No respiratory distress. He has no wheezes. He has no rales.  Abdominal: Soft. Bowel sounds are normal. He exhibits no distension. There is no tenderness. There is no rebound and no guarding.  Musculoskeletal: He exhibits tenderness (over R hip). He exhibits no edema.  Lymphadenopathy:    He has no cervical adenopathy.  Neurological: He is alert and oriented to person, place, and time.  Skin: Skin is warm and  dry.  Psychiatric: He has a normal mood and affect. His behavior is normal. Judgment and thought content normal.          Assessment & Plan:

## 2011-08-12 DIAGNOSIS — Z0279 Encounter for issue of other medical certificate: Secondary | ICD-10-CM

## 2011-08-15 ENCOUNTER — Other Ambulatory Visit: Payer: Self-pay | Admitting: *Deleted

## 2011-08-15 ENCOUNTER — Telehealth: Payer: Self-pay | Admitting: Family Medicine

## 2011-08-15 MED ORDER — HYDROCODONE-ACETAMINOPHEN 5-500 MG PO TABS: 1.0000 | ORAL_TABLET | ORAL | Status: DC | PRN

## 2011-08-15 NOTE — Telephone Encounter (Signed)
[place] for [employer] Note until 6/15- starting date 4/13 (date of hospitalization).  [place] for #60 vicodin

## 2011-08-15 NOTE — Telephone Encounter (Signed)
Noted pt had FMLA forms filled out for the pt and his wife, all forms completed by MD Birdie Riddle, noted there are places the pt needs to sign, left vm to call office to advise we have completed our part, does he want to come pick the papers up per needs to sign and we can fax but they need to be completed by him as well.

## 2011-08-15 NOTE — Telephone Encounter (Signed)
Note to excuse pt has been completed, Vicodin RX has been faxed to pharmacy, called pt wife to advise the FMLA and disability papers are complete and ready for pick up as well as the excuse note for work, [name] was unavailable, left VM, pt called while completing this note, pt advised that he will pick up the paperwork, pt understood all paperwork is at front desk and that the rx has been sent to the pharmacy,

## 2011-08-15 NOTE — Telephone Encounter (Signed)
Caller: [name]/Spouse; Phone Number: [phone]; Message from caller: Needing a doctors note for work stating he has been in the hospital and needs to be out of work until 09/24/11.  She is also needing to have his Vicodin [phone] called in.

## 2011-08-16 NOTE — Assessment & Plan Note (Signed)
New to provider.  Pt has been d/c'd on 6 weeks of IV abx per ID.  Has home health set up to assist in infusion and for PT due to deconditioning.  Will follow.

## 2011-08-16 NOTE — Assessment & Plan Note (Signed)
New.  Pt completing 6 week course of IV abx via PICC for ilacus muscle abscess.  Has f/u w/ ID scheduled.

## 2011-08-16 NOTE — Assessment & Plan Note (Signed)
New.  Pt had 11 teeth removed as this was the bacterial source that seeded his bloodstream and caused muscle abscess.  Has had f/u w/ Dr Enrique Sack.

## 2011-08-16 NOTE — Assessment & Plan Note (Signed)
New.  Pt w/ PICC line in place in L upper arm.  Has home health nursing set up.

## 2011-08-30 ENCOUNTER — Telehealth: Payer: Self-pay | Admitting: *Deleted

## 2011-08-30 NOTE — Telephone Encounter (Signed)
Called patient to remind him of appt with Dr. Linus Salmons for tomorrow. Myrtis Hopping CMA

## 2011-08-31 ENCOUNTER — Ambulatory Visit (INDEPENDENT_AMBULATORY_CARE_PROVIDER_SITE_OTHER): Payer: BC Managed Care – PPO | Admitting: Internal Medicine

## 2011-08-31 ENCOUNTER — Encounter: Payer: Self-pay | Admitting: Internal Medicine

## 2011-08-31 VITALS — BP 145/92 | HR 74 | Temp 98.4°F | Ht 66.0 in | Wt 194.0 lb

## 2011-08-31 DIAGNOSIS — R7881 Bacteremia: Secondary | ICD-10-CM

## 2011-08-31 DIAGNOSIS — L0390 Cellulitis, unspecified: Secondary | ICD-10-CM

## 2011-08-31 DIAGNOSIS — L0291 Cutaneous abscess, unspecified: Secondary | ICD-10-CM

## 2011-08-31 DIAGNOSIS — A4901 Methicillin susceptible Staphylococcus aureus infection, unspecified site: Secondary | ICD-10-CM

## 2011-08-31 MED ORDER — RIFAMPIN 300 MG PO CAPS: 300.0000 mg | ORAL_CAPSULE | Freq: Two times a day (BID) | ORAL | Status: DC

## 2011-08-31 NOTE — Progress Notes (Signed)
  Subjective:    Patient ID: Steve [name], male    DOB: 1955/06/10, 56 y.o.   MRN: [id number]  HPI This patient comes in for hospital followup. He initially presented to his orthopedist with right hip pain an x-ray was okay however then do too high fever and chills he presented to emergency room where CT scan of his hip showed an abscess collection versus hematoma. Subsequently, he did grow out staph aureus bacteremia, methicillin sensitive, in his blood cultures and he did have an evaluation with a negative TTE and negative TEE. He was then placed on cefazolin and rifampin which he had now for 4 weeks of projected 6 week course. He has been really asymptomatic with no fevers, no chills, and the pain has since resolved and his leg he has no significant problems. He has had no diarrhea or any other issues with the antibiotics and his PICC line has been good with good flushing in and out.   Review of Systems  Constitutional: Negative.   Respiratory: Negative for cough, shortness of breath and wheezing.   Cardiovascular: Negative for chest pain, palpitations and leg swelling.  Gastrointestinal: Negative.   Musculoskeletal: Negative for myalgias, joint swelling and arthralgias.  Skin: Negative for pallor and rash.       PICC line is clean with no erythema.  Hematological: Negative for adenopathy.  Psychiatric/Behavioral: The patient is not nervous/anxious.        Objective:   Physical Exam        Assessment & Plan:

## 2011-08-31 NOTE — Assessment & Plan Note (Addendum)
He is doing well and is close to finishing his 6 week course. I will reimage his hip though clinically he is certainly much improved. If the CT does not show any significant issues, I will have him finish at the end of the 6 weeks which is in 2 more weeks. At that time then I will have home health post PICC line if the CT is okay and he can return as needed.  If there any issues with worsening abscess collection or no resolution, he will be called in will have more followup.

## 2011-09-07 ENCOUNTER — Telehealth: Payer: Self-pay | Admitting: *Deleted

## 2011-09-07 NOTE — Telephone Encounter (Signed)
Patient called to check the status of his CT of the Hip. Advised him that we are waiting on prior Auth from his insurance company and that as soon as we get that we will give him a call.

## 2011-09-09 ENCOUNTER — Telehealth: Payer: Self-pay | Admitting: *Deleted

## 2011-09-09 NOTE — Telephone Encounter (Signed)
Called patient and notified of CT appointment at [name] for Tuesday 09/13/11 at [place]

## 2011-09-12 ENCOUNTER — Ambulatory Visit (INDEPENDENT_AMBULATORY_CARE_PROVIDER_SITE_OTHER): Payer: BC Managed Care – PPO | Admitting: Family Medicine

## 2011-09-12 ENCOUNTER — Encounter: Payer: Self-pay | Admitting: Family Medicine

## 2011-09-12 VITALS — BP 132/78 | HR 54 | Temp 98.5°F | Ht 65.5 in | Wt 193.8 lb

## 2011-09-12 DIAGNOSIS — Z0000 Encounter for general adult medical examination without abnormal findings: Secondary | ICD-10-CM

## 2011-09-12 DIAGNOSIS — M25529 Pain in unspecified elbow: Secondary | ICD-10-CM

## 2011-09-12 LAB — TSH: TSH: 2.83 u[IU]/mL (ref 0.35–5.50)

## 2011-09-12 LAB — CBC WITH DIFFERENTIAL/PLATELET
Basophils Relative: 1 % (ref 0.0–3.0)
Eosinophils Relative: 3.6 % (ref 0.0–5.0)
HCT: 41.4 % (ref 39.0–52.0)
Hemoglobin: 13.4 g/dL (ref 13.0–17.0)
Lymphs Abs: 2.7 10*3/uL (ref 0.7–4.0)
MCV: 90.1 fl (ref 78.0–100.0)
Monocytes Absolute: 0.5 10*3/uL (ref 0.1–1.0)
Monocytes Relative: 7.8 % (ref 3.0–12.0)
Neutro Abs: 3.3 10*3/uL (ref 1.4–7.7)
Platelets: 232 10*3/uL (ref 150.0–400.0)
WBC: 6.8 10*3/uL (ref 4.5–10.5)

## 2011-09-12 LAB — BASIC METABOLIC PANEL
BUN: 9 mg/dL (ref 6–23)
CO2: 29 mEq/L (ref 19–32)
Calcium: 9 mg/dL (ref 8.4–10.5)
Creatinine, Ser: 0.7 mg/dL (ref 0.4–1.5)
GFR: 128.25 mL/min (ref >60.00)
Glucose, Bld: 97 mg/dL (ref 70–99)
Sodium: 139 mEq/L (ref 135–145)

## 2011-09-12 LAB — HEPATIC FUNCTION PANEL
Albumin: 3.7 g/dL (ref 3.5–5.2)
Alkaline Phosphatase: 52 U/L (ref 39–117)
Total Protein: 7.2 g/dL (ref 6.0–8.3)

## 2011-09-12 LAB — LIPID PANEL
Cholesterol: 190 mg/dL (ref 0–200)
HDL: 29.6 mg/dL — ABNORMAL LOW (ref >39.00)
Triglycerides: 212 mg/dL — ABNORMAL HIGH (ref 0.0–149.0)

## 2011-09-12 LAB — PSA: PSA: 0.73 ng/mL (ref 0.10–4.00)

## 2011-09-12 NOTE — Progress Notes (Signed)
  Subjective:    Patient ID: Steve [name], male    DOB: 01/28/1956, 56 y.o.   MRN: [id number]  HPI CPE- UTD on colonoscopy.  Has CT scan of hip tomorrow to assess for any lingering infxn.  Otherwise feeling well.  L elbow pain- pt reports 'bumps' on L elbow.  Very TTP.  Pt has been leaning on elbow during PICC infusions.  Now w/ shooting pain since Sunday.   Review of Systems Patient reports no vision/hearing changes, anorexia, fever ,adenopathy, persistant/recurrent hoarseness, swallowing issues, chest pain, palpitations, edema, persistant/recurrent cough, hemoptysis, dyspnea (rest,exertional, paroxysmal nocturnal), gastrointestinal  bleeding (melena, rectal bleeding), abdominal pain, excessive heart burn, GU symptoms (dysuria, hematuria, voiding/incontinence issues) syncope, focal weakness, memory loss, numbness & tingling, skin/hair/nail changes, depression, anxiety, abnormal bruising/bleeding, musculoskeletal symptoms/signs.     Objective:   Physical Exam BP 132/78  Pulse 54  Temp(Src) 98.5 F (36.9 C) (Oral)  Ht 5' 5.5" (1.664 m)  Wt 193 lb 12.8 oz (87.907 kg)  BMI 31.76 kg/m2  SpO2 98%  General Appearance:    Alert, cooperative, no distress, appears stated age  Head:    Normocephalic, without obvious abnormality, atraumatic  Eyes:    PERRL, conjunctiva/corneas clear, EOM's intact, fundi    benign, both eyes       Ears:    Normal TM's and external ear canals, both ears  Nose:   Nares normal, septum midline, mucosa normal, no drainage   or sinus tenderness  Throat:   Lips, mucosa, and tongue normal; teeth and gums normal  Neck:   Supple, symmetrical, trachea midline, no adenopathy;       thyroid:  No enlargement/tenderness/nodules  Back:     Symmetric, no curvature, ROM normal, no CVA tenderness  Lungs:     Clear to auscultation bilaterally, respirations unlabored  Chest wall:    No tenderness or deformity  Heart:    Regular rate and rhythm, S1 and S2 normal, no murmur,  rub   or gallop  Abdomen:     Soft, non-tender, bowel sounds active all four quadrants,    no masses, no organomegaly  Genitalia:    Normal male without lesion, discharge or tenderness  Rectal:    Normal tone, normal prostate, no masses or tenderness;   guaiac negative stool  Extremities:   Extremities normal, atraumatic, no cyanosis or edema.  + superficial vericosities of B LEs.  PICC in place in L upper arm.  Seemingly calcified cysts on L olecranon- TTP  Pulses:   2+ and symmetric all extremities  Skin:   Skin color, texture, turgor normal, no rashes or lesions  Lymph nodes:   Cervical, supraclavicular, and axillary nodes normal  Neurologic:   CNII-XII intact. Normal strength, sensation and reflexes      throughout          Assessment & Plan:

## 2011-09-12 NOTE — Patient Instructions (Signed)
We'll notify you of your lab results and make any changes if needed Keep up the good work- you look good! Someone will call you with your sports med appt Call with any questions or concerns Hang in there! Happy early birthday!!!

## 2011-09-13 ENCOUNTER — Encounter: Payer: Self-pay | Admitting: *Deleted

## 2011-09-13 ENCOUNTER — Ambulatory Visit (HOSPITAL_COMMUNITY)
Admission: RE | Admit: 2011-09-13 | Discharge: 2011-09-13 | Disposition: A | Discharge status: Home / Self Care | Payer: BC Managed Care – PPO | Source: Ambulatory Visit | Attending: Internal Medicine | Admitting: Internal Medicine

## 2011-09-13 ENCOUNTER — Telehealth: Payer: Self-pay | Admitting: *Deleted

## 2011-09-13 DIAGNOSIS — Z792 Long term (current) use of antibiotics: Secondary | ICD-10-CM | POA: Insufficient documentation

## 2011-09-13 DIAGNOSIS — L03119 Cellulitis of unspecified part of limb: Secondary | ICD-10-CM | POA: Insufficient documentation

## 2011-09-13 DIAGNOSIS — Z96649 Presence of unspecified artificial hip joint: Secondary | ICD-10-CM | POA: Insufficient documentation

## 2011-09-13 DIAGNOSIS — L02419 Cutaneous abscess of limb, unspecified: Secondary | ICD-10-CM | POA: Insufficient documentation

## 2011-09-13 MED ORDER — IOHEXOL 300 MG/ML  SOLN
100.0000 mL | Freq: Once | INTRAMUSCULAR | Status: AC | PRN
  Administered 2011-09-13: 100 mL via INTRAVENOUS

## 2011-09-13 MED ORDER — ATORVASTATIN CALCIUM 10 MG PO TABS: 10.0000 mg | ORAL_TABLET | Freq: Every day | ORAL | Status: DC

## 2011-09-13 NOTE — Telephone Encounter (Signed)
Patient notified and home health agency given verbal order to pull PICC at end of IV therapy. Myrtis Hopping CMA

## 2011-09-13 NOTE — Telephone Encounter (Signed)
Message copied by Georgena Spurling on Tue Sep 13, 2011  2:09 PM ------      Message from: Thayer Headings      Created: Tue Sep 13, 2011 12:33 PM       Let the patient know that the CT scan of his hip looks great and no residual infection.  He can complete his 6 weeks of IV therapy and stop and have the PICC line pulled (he should be completing very soon, probably this week).  He can follow up PRN.

## 2011-09-15 ENCOUNTER — Encounter: Payer: Self-pay | Admitting: Family Medicine

## 2011-09-15 ENCOUNTER — Ambulatory Visit (INDEPENDENT_AMBULATORY_CARE_PROVIDER_SITE_OTHER): Payer: BC Managed Care – PPO | Admitting: Family Medicine

## 2011-09-15 VITALS — BP 143/91 | HR 61 | Temp 98.1°F | Ht 67.0 in | Wt 192.0 lb

## 2011-09-15 DIAGNOSIS — M25529 Pain in unspecified elbow: Secondary | ICD-10-CM

## 2011-09-16 ENCOUNTER — Telehealth: Payer: Self-pay | Admitting: *Deleted

## 2011-09-16 ENCOUNTER — Encounter: Payer: Self-pay | Admitting: Family Medicine

## 2011-09-16 NOTE — Telephone Encounter (Signed)
Patient called, he is having his PICC line removed today, and he is requesting a note from Dr. Linus Salmons releasing him to return to work.  Patient advised that Dr. Linus Salmons does not return to clinic until Tuesday 09/20/11 and he may have to wait until then for the note to be signed. Myrtis Hopping CMA

## 2011-09-16 NOTE — Progress Notes (Signed)
Subjective:    Patient ID: Steve [name], male    DOB: 1955/05/17, 56 y.o.   MRN: [id number]  PCP: Dr. Birdie Riddle  HPI 56 yo M here for left elbow pain.  Patient denies known injury. Has been treated over past several weeks for pelvic abscess - has PICC line in place and almost finished with 6 weeks of IV antibiotics. States past 3 weeks has had left elbow pain at olecranon. Worse with compression, palpation of the area. No swelling that he's noticed. Has some small nodules in this region. No prior issues with left elbow. No limitation of motion. Not tried anything for this.  Past Medical History  Diagnosis Date  . Sleep apnea     cpap not available  . GERD (gastroesophageal reflux disease)     alot of reflux  . Abscess of muscle 08/10/2011    Current Outpatient Prescriptions on File Prior to Visit  Medication Sig Dispense Refill  . atorvastatin (LIPITOR) 10 MG tablet Take 1 tablet (10 mg total) by mouth daily.  90 tablet  0  . ceFAZolin (ANCEF) 1-5 GM-% Inject 50 mLs (1 g total) into the vein every 8 (eight) hours.  50 mL  1  . rifampin (RIFADIN) 300 MG capsule Take 1 capsule (300 mg total) by mouth 2 (two) times daily.  28 capsule  0    Past Surgical History  Procedure Date  . Hernia repair     inguinal hernia x3  . Joint replacement     bilateral hip replacement  . Tee without cardioversion 07/29/2011    Procedure: TRANSESOPHAGEAL ECHOCARDIOGRAM (TEE);  Surgeon: Josue Hector, MD;  Location: [facility] ENDOSCOPY;  Service: Cardiovascular;  Laterality: N/A;  . Multiple extractions with alveoloplasty 07/28/2011    Procedure: MULTIPLE EXTRACION WITH ALVEOLOPLASTY;  Surgeon: Lenn Cal, DDS;  Location: [facility] ORS;  Service: Oral Surgery;  Laterality: N/A;  Extraction of tooth #'s 2,3,4,5,6,11,12,13,15,19,22 with alveoloplasty.    No Known Allergies  History   Social History  . Marital Status: Married    Spouse Name: N/A    Number of Children: N/A  . Years of Education:  N/A   Occupational History  . Not on file.   Social History Main Topics  . Smoking status: Never Smoker   . Smokeless tobacco: Never Used  . Alcohol Use: 0.6 oz/week    1 Cans of beer per week  . Drug Use: No  . Sexually Active: Yes   Other Topics Concern  . Not on file   Social History Narrative  . No narrative on file    Family History  Problem Relation Age of Onset  . Melanoma Mother   . Heart attack Mother   . Hyperlipidemia Neg Hx   . Sudden death Neg Hx   . Hypertension Neg Hx   . Diabetes Neg Hx     BP 143/91  Pulse 61  Temp(Src) 98.1 F (36.7 C) (Oral)  Ht 5\' 7"  (1.702 m)  Wt 192 lb (87.091 kg)  BMI 30.07 kg/m2  Review of Systems See HPI above.    Objective:   Physical Exam Gen: NAD  L elbow: No swelling, bruising, deformity.  Some bogginess at olecranon bursa. Minimal TTP at olecranon.  No triceps, medial/lateral, other TTP left elbow. FROM without pain - 5/5 strength flexion and extension. Collateral ligaments intact. NVI distally.  R elbow: FROM without instability.    Assessment & Plan:  1. Left elbow pain - History and exam consistent with  mild olecranon bursitis.  Exam otherwise benign - no evidence of effusion, intraarticular pathology, epicondylitis, ligamentous or bony pathology.  Reassured patient.  Advised to buy an elbow pad to prevent direct compression, icing, tylenol as needed for pain.  Do not think cortisone injection is warranted (nothing to aspirate currently either).  F/u prn.

## 2011-09-16 NOTE — Assessment & Plan Note (Signed)
History and exam consistent with mild olecranon bursitis.  Exam otherwise benign - no evidence of effusion, intraarticular pathology, epicondylitis, ligamentous or bony pathology.  Reassured patient.  Advised to buy an elbow pad to prevent direct compression, icing, tylenol as needed for pain.  Do not think cortisone injection is warranted (nothing to aspirate currently either).  F/u prn.

## 2011-09-16 NOTE — Telephone Encounter (Signed)
Patient called advised he had his last dose of medication yesterday and the nurse is coming to remove his PICC today. He just wanted to make sure we knew this information.

## 2011-09-20 ENCOUNTER — Encounter: Payer: Self-pay | Admitting: *Deleted

## 2011-09-27 NOTE — Assessment & Plan Note (Signed)
Pt's CPE WNL w/ exception of L elbow and presence of PICC line.  Also edentulous.  UTD on health maintenance.  Check labs.  Anticipatory guidance provided.

## 2011-09-27 NOTE — Assessment & Plan Note (Signed)
New.  Site of L elbow pain appears to be calcified cyst.  Refer to sports med for evaluation and tx.  Pt expressed understanding and is in agreement w/ plan.

## 2011-11-01 ENCOUNTER — Telehealth: Payer: Self-pay | Admitting: Family Medicine

## 2011-11-01 NOTE — Telephone Encounter (Signed)
Pt can be seen in office or can give this more time and typically they expel on their own- as long as there is no sign of infxn.  If they want to be seen, that is perfectly reasonable/understandable.  Can see him tomorrow.

## 2011-11-01 NOTE — Telephone Encounter (Signed)
.  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

## 2011-11-01 NOTE — Telephone Encounter (Signed)
Caller: Michele/Spouse; PCP: Midge Minium.; CB#: [phone]; Wife calling today 11/01/11 regarding husband got bit by tick on 10/29/11.  Scratched tick.  Head is still embedded.  Tick is on abdomen.  Emergent symptoms r/o by Bites and Stings Insects or Spiders with exception of tick bite. Care advice given. OFFICE PLEASE CALL WIFE AT [phone] TO LET HER KNOW WHEN HUSBAND CAN COME IN TO GET TICK HEAD REMOVED.  THEY HAVE NOT BEEN SUCCESSFUL IN REMOVING HEAD.

## 2011-11-02 NOTE — Telephone Encounter (Signed)
.  left message to have patient return my call to get an update from pt to see if his concern had resolved.

## 2011-11-03 NOTE — Telephone Encounter (Signed)
Advised MD Birdie Riddle that pt has been contacted several times about concern, advised last phone message stated if pt still has concerns with this issue to please contact our office at [phone].

## 2011-11-09 ENCOUNTER — Ambulatory Visit (INDEPENDENT_AMBULATORY_CARE_PROVIDER_SITE_OTHER): Payer: BC Managed Care – PPO | Admitting: Family Medicine

## 2011-11-09 ENCOUNTER — Encounter: Payer: Self-pay | Admitting: Family Medicine

## 2011-11-09 VITALS — BP 130/78 | HR 77 | Temp 98.8°F | Ht 65.0 in | Wt 193.4 lb

## 2011-11-09 DIAGNOSIS — T148XXA Other injury of unspecified body region, initial encounter: Secondary | ICD-10-CM

## 2011-11-09 DIAGNOSIS — S30861A Insect bite (nonvenomous) of abdominal wall, initial encounter: Secondary | ICD-10-CM

## 2011-11-09 DIAGNOSIS — S30860A Insect bite (nonvenomous) of lower back and pelvis, initial encounter: Secondary | ICD-10-CM

## 2011-11-09 DIAGNOSIS — W57XXXA Bitten or stung by nonvenomous insect and other nonvenomous arthropods, initial encounter: Secondary | ICD-10-CM | POA: Insufficient documentation

## 2011-11-09 MED ORDER — DOXYCYCLINE HYCLATE 100 MG PO TABS: 100.0000 mg | ORAL_TABLET | Freq: Two times a day (BID) | ORAL | Status: AC

## 2011-11-09 NOTE — Progress Notes (Signed)
  Subjective:    Patient ID: Steve [name], male    DOB: 08-26-1955, 56 y.o.   MRN: [id number]  HPI Tick bite- bit 10 days ago.  Tick was embedded in L flank.  No pain.  + itching.  Now w/ red ring around bite and central black area.  Pt unsure if head is out.   Review of Systems For ROS see HPI     Objective:   Physical Exam  Vitals reviewed. Constitutional: He appears well-developed and well-nourished. No distress.  Skin: Skin is warm and dry. There is erythema (circular area of erythema (no induration) surrounding central tick bite w/ head embedded in skin.  head successfully removed w/ forceps and traction).          Assessment & Plan:

## 2011-11-09 NOTE — Patient Instructions (Addendum)
Keep the area clean and dry Start the Doxycycline twice daily w/ food to prevent infection (this will also treat any lyme or RMSF exposures) If you have worsening pain, redness, or drainage- call!!! Hang in there!!!

## 2011-11-22 NOTE — Assessment & Plan Note (Signed)
New.  Tick head successfully removed w/ forceps.  Start Doxy to cover for possible infxn and RMSF/Lyme exposure.  Reviewed supportive care and red flags that should prompt return.  Pt expressed understanding and is in agreement w/ plan.

## 2012-07-24 ENCOUNTER — Ambulatory Visit (INDEPENDENT_AMBULATORY_CARE_PROVIDER_SITE_OTHER): Payer: BC Managed Care – PPO | Admitting: Family Medicine

## 2012-07-24 ENCOUNTER — Encounter: Payer: Self-pay | Admitting: Family Medicine

## 2012-07-24 VITALS — BP 146/92 | HR 74 | Temp 98.3°F | Wt 199.0 lb

## 2012-07-24 DIAGNOSIS — J019 Acute sinusitis, unspecified: Secondary | ICD-10-CM

## 2012-07-24 MED ORDER — CEFUROXIME AXETIL 500 MG PO TABS: 500.0000 mg | ORAL_TABLET | Freq: Two times a day (BID) | ORAL | Status: AC

## 2012-07-24 MED ORDER — MOMETASONE FUROATE 50 MCG/ACT NA SUSP: 2.0000 | Freq: Every day | NASAL | Status: DC

## 2012-07-24 NOTE — Patient Instructions (Signed)

## 2012-07-24 NOTE — Progress Notes (Signed)
  Subjective:     Steve [name] is a 57 y.o. male who presents for evaluation of sinus pain. Symptoms include: congestion, cough, facial pain, headaches, nasal congestion, post nasal drip, purulent rhinorrhea, sinus pressure and sore throat. Onset of symptoms was 10 days ago. Symptoms have been gradually worsening since that time. Past history is significant for no history of pneumonia or bronchitis. Patient is a non-smoker.  The following portions of the patient's history were reviewed and updated as appropriate: allergies, current medications, past family history, past medical history, past social history, past surgical history and problem list.  Review of Systems Pertinent items are noted in HPI.   Objective:    BP 146/92  Pulse 74  Temp(Src) 98.3 F (36.8 C) (Oral)  Wt 199 lb (90.266 kg)  BMI 33.12 kg/m2  SpO2 97% General appearance: alert, cooperative, appears stated age and no distress Ears: normal TM's and external ear canals both ears Nose: Nares normal. Septum midline. Mucosa normal. No drainage or sinus tenderness., green discharge, moderate congestion, turbinates red, swollen, sinus tenderness bilateral Throat: lips, mucosa, and tongue normal; teeth and gums normal Neck: mild anterior cervical adenopathy, supple, symmetrical, trachea midline and thyroid not enlarged, symmetric, no tenderness/mass/nodules Lungs: clear to auscultation bilaterally Heart: S1, S2 normal    Assessment:    Acute bacterial sinusitis.    Plan:    Nasal steroids per medication orders. Antihistamines per medication orders. Ceftin per medication orders. f/u prn

## 2012-08-24 ENCOUNTER — Other Ambulatory Visit: Payer: Self-pay | Admitting: Orthopedic Surgery

## 2012-08-24 ENCOUNTER — Ambulatory Visit
Admission: RE | Admit: 2012-08-24 | Discharge: 2012-08-24 | Disposition: A | Discharge status: Home / Self Care | Payer: BC Managed Care – PPO | Source: Ambulatory Visit | Attending: Orthopedic Surgery | Admitting: Orthopedic Surgery

## 2012-08-24 DIAGNOSIS — Z0000 Encounter for general adult medical examination without abnormal findings: Secondary | ICD-10-CM

## 2012-08-24 DIAGNOSIS — W57XXXA Bitten or stung by nonvenomous insect and other nonvenomous arthropods, initial encounter: Secondary | ICD-10-CM

## 2012-08-24 DIAGNOSIS — E291 Testicular hypofunction: Secondary | ICD-10-CM

## 2012-08-24 DIAGNOSIS — G4730 Sleep apnea, unspecified: Secondary | ICD-10-CM

## 2012-08-24 DIAGNOSIS — E739 Lactose intolerance, unspecified: Secondary | ICD-10-CM

## 2012-08-24 DIAGNOSIS — Z95828 Presence of other vascular implants and grafts: Secondary | ICD-10-CM

## 2012-08-24 DIAGNOSIS — M60009 Infective myositis, unspecified site: Secondary | ICD-10-CM

## 2012-08-24 DIAGNOSIS — Z8719 Personal history of other diseases of the digestive system: Secondary | ICD-10-CM

## 2012-08-24 DIAGNOSIS — T8140XA Infection following a procedure, unspecified, initial encounter: Secondary | ICD-10-CM

## 2012-08-24 DIAGNOSIS — R1031 Right lower quadrant pain: Secondary | ICD-10-CM

## 2012-08-24 DIAGNOSIS — T8459XA Infection and inflammatory reaction due to other internal joint prosthesis, initial encounter: Secondary | ICD-10-CM

## 2012-08-24 DIAGNOSIS — IMO0001 Reserved for inherently not codable concepts without codable children: Secondary | ICD-10-CM

## 2012-08-24 DIAGNOSIS — R7881 Bacteremia: Secondary | ICD-10-CM

## 2012-08-24 LAB — SYNOVIAL CELL COUNT + DIFF, W/ CRYSTALS
Crystals, Fluid: NONE SEEN
Eosinophils-Synovial: 0 % (ref 0–1)
Monocyte/Macrophage: 2 % — ABNORMAL LOW (ref 50–90)
WBC, Synovial: 365900 cu mm — ABNORMAL HIGH (ref 0–200)

## 2012-08-24 MED ORDER — IOHEXOL 180 MG/ML  SOLN: 1.0000 mL | Freq: Once | INTRAMUSCULAR | Status: AC | PRN

## 2012-08-26 ENCOUNTER — Other Ambulatory Visit: Payer: Self-pay | Admitting: Orthopedic Surgery

## 2012-08-26 ENCOUNTER — Ambulatory Visit (HOSPITAL_COMMUNITY)
Admission: RE | Admit: 2012-08-26 | Discharge: 2012-08-26 | Disposition: A | Discharge status: Home / Self Care | Payer: BC Managed Care – PPO | Source: Ambulatory Visit | Attending: Orthopedic Surgery | Admitting: Orthopedic Surgery

## 2012-08-26 ENCOUNTER — Other Ambulatory Visit (HOSPITAL_COMMUNITY): Payer: Self-pay | Admitting: Orthopedic Surgery

## 2012-08-26 DIAGNOSIS — IMO0002 Reserved for concepts with insufficient information to code with codable children: Secondary | ICD-10-CM

## 2012-08-26 DIAGNOSIS — Z1389 Encounter for screening for other disorder: Secondary | ICD-10-CM | POA: Insufficient documentation

## 2012-08-28 LAB — BODY FLUID CULTURE: Gram Stain: NONE SEEN

## 2012-08-29 LAB — ANAEROBIC CULTURE: Gram Stain: NONE SEEN

## 2012-08-31 ENCOUNTER — Encounter (HOSPITAL_COMMUNITY): Payer: Self-pay | Admitting: *Deleted

## 2012-08-31 ENCOUNTER — Encounter (HOSPITAL_COMMUNITY): Payer: Self-pay | Admitting: Pharmacy Technician

## 2012-08-31 NOTE — Progress Notes (Signed)
[facility] SURGERY INSTRUCTIONS REVIEWED WITH PATIENT - INCLUDING CHLORHEXIDINE SHOWERS/ PRECAUTIONS.

## 2012-09-04 ENCOUNTER — Other Ambulatory Visit: Payer: Self-pay | Admitting: Orthopedic Surgery

## 2012-09-04 MED ORDER — DEXAMETHASONE SODIUM PHOSPHATE 10 MG/ML IJ SOLN: 10.0000 mg | Freq: Once | INTRAMUSCULAR | Status: DC

## 2012-09-04 MED ORDER — BUPIVACAINE LIPOSOME 1.3 % IJ SUSP: 20.0000 mL | Freq: Once | INTRAMUSCULAR | Status: DC

## 2012-09-05 ENCOUNTER — Ambulatory Visit (HOSPITAL_COMMUNITY): Payer: BC Managed Care – PPO

## 2012-09-05 ENCOUNTER — Encounter (HOSPITAL_COMMUNITY): Payer: Self-pay | Admitting: General Practice

## 2012-09-05 ENCOUNTER — Inpatient Hospital Stay (HOSPITAL_COMMUNITY)
Admission: RE | Admit: 2012-09-05 | Discharge: 2012-09-08 | DRG: 210 | Disposition: A | Discharge status: Home Health | Payer: BC Managed Care – PPO | Source: Ambulatory Visit | Attending: Orthopedic Surgery | Admitting: Orthopedic Surgery

## 2012-09-05 ENCOUNTER — Inpatient Hospital Stay (HOSPITAL_COMMUNITY): Payer: BC Managed Care – PPO

## 2012-09-05 ENCOUNTER — Ambulatory Visit (HOSPITAL_COMMUNITY): Payer: BC Managed Care – PPO | Admitting: Anesthesiology

## 2012-09-05 ENCOUNTER — Encounter (HOSPITAL_COMMUNITY): Payer: Self-pay | Admitting: Anesthesiology

## 2012-09-05 ENCOUNTER — Encounter (HOSPITAL_COMMUNITY)
Admission: RE | Disposition: A | Discharge status: Home Health | Payer: Self-pay | Source: Ambulatory Visit | Attending: Orthopedic Surgery

## 2012-09-05 DIAGNOSIS — T8450XA Infection and inflammatory reaction due to unspecified internal joint prosthesis, initial encounter: Principal | ICD-10-CM | POA: Diagnosis present

## 2012-09-05 DIAGNOSIS — G4730 Sleep apnea, unspecified: Secondary | ICD-10-CM | POA: Diagnosis present

## 2012-09-05 DIAGNOSIS — M009 Pyogenic arthritis, unspecified: Secondary | ICD-10-CM | POA: Diagnosis present

## 2012-09-05 DIAGNOSIS — A4901 Methicillin susceptible Staphylococcus aureus infection, unspecified site: Secondary | ICD-10-CM | POA: Diagnosis present

## 2012-09-05 DIAGNOSIS — Z89621 Acquired absence of right hip joint: Secondary | ICD-10-CM

## 2012-09-05 DIAGNOSIS — K219 Gastro-esophageal reflux disease without esophagitis: Secondary | ICD-10-CM | POA: Diagnosis present

## 2012-09-05 DIAGNOSIS — Z96649 Presence of unspecified artificial hip joint: Secondary | ICD-10-CM

## 2012-09-05 DIAGNOSIS — Y831 Surgical operation with implant of artificial internal device as the cause of abnormal reaction of the patient, or of later complication, without mention of misadventure at the time of the procedure: Secondary | ICD-10-CM | POA: Diagnosis present

## 2012-09-05 DIAGNOSIS — D62 Acute posthemorrhagic anemia: Secondary | ICD-10-CM | POA: Diagnosis not present

## 2012-09-05 DIAGNOSIS — M9190 Juvenile osteochondrosis of hip and pelvis, unspecified, unspecified leg: Secondary | ICD-10-CM | POA: Diagnosis present

## 2012-09-05 HISTORY — DX: Acute posthemorrhagic anemia: D62

## 2012-09-05 HISTORY — PX: TOTAL HIP REVISION: SHX763

## 2012-09-05 HISTORY — DX: Pyogenic arthritis, unspecified: M00.9

## 2012-09-05 HISTORY — DX: Other specified congenital deformities of hip: Q65.89

## 2012-09-05 LAB — PROTIME-INR: INR: 1.11 (ref 0.00–1.49)

## 2012-09-05 LAB — COMPREHENSIVE METABOLIC PANEL
ALT: 10 U/L (ref 0–53)
AST: 13 U/L (ref 0–37)
CO2: 27 mEq/L (ref 19–32)
Calcium: 9.2 mg/dL (ref 8.4–10.5)
Creatinine, Ser: 0.79 mg/dL (ref 0.50–1.35)
GFR calc Af Amer: >90 mL/min (ref >90)
GFR calc non Af Amer: >90 mL/min (ref >90)
Sodium: 139 mEq/L (ref 135–145)
Total Protein: 8.1 g/dL (ref 6.0–8.3)

## 2012-09-05 LAB — URINALYSIS, ROUTINE W REFLEX MICROSCOPIC
Glucose, UA: NEGATIVE mg/dL
Hgb urine dipstick: NEGATIVE
Specific Gravity, Urine: 1.027 (ref 1.005–1.030)
Urobilinogen, UA: 0.2 mg/dL (ref 0.0–1.0)
pH: 5.5 (ref 5.0–8.0)

## 2012-09-05 LAB — CBC
MCH: 25.6 pg — ABNORMAL LOW (ref 26.0–34.0)
MCHC: 32.4 g/dL (ref 30.0–36.0)
MCV: 79 fL (ref 78.0–100.0)
Platelets: 328 10*3/uL (ref 150–400)
RBC: 4.53 MIL/uL (ref 4.22–5.81)

## 2012-09-05 LAB — PREPARE RBC (CROSSMATCH)

## 2012-09-05 LAB — SURGICAL PCR SCREEN
MRSA, PCR: NEGATIVE
Staphylococcus aureus: POSITIVE — AB

## 2012-09-05 LAB — GLUCOSE, CAPILLARY: Glucose-Capillary: 97 mg/dL (ref 70–99)

## 2012-09-05 SURGERY — TOTAL HIP REVISION
Anesthesia: General | Site: Hip | Laterality: Right | Wound class: Dirty or Infected

## 2012-09-05 MED ORDER — PHENOL 1.4 % MT LIQD
1.0000 | OROMUCOSAL | Status: DC | PRN
  Filled 2012-09-05: qty 177

## 2012-09-05 MED ORDER — DEXAMETHASONE SODIUM PHOSPHATE 4 MG/ML IJ SOLN
INTRAMUSCULAR | Status: DC | PRN
  Administered 2012-09-05: 8 mg via INTRAVENOUS

## 2012-09-05 MED ORDER — ACETAMINOPHEN 10 MG/ML IV SOLN
INTRAVENOUS | Status: DC | PRN
  Administered 2012-09-05: 1000 mg via INTRAVENOUS

## 2012-09-05 MED ORDER — POLYETHYLENE GLYCOL 3350 17 G PO PACK: 17.0000 g | PACK | Freq: Every day | ORAL | Status: DC | PRN

## 2012-09-05 MED ORDER — PROMETHAZINE HCL 25 MG/ML IJ SOLN: 6.2500 mg | INTRAMUSCULAR | Status: DC | PRN

## 2012-09-05 MED ORDER — BISACODYL 10 MG RE SUPP: 10.0000 mg | Freq: Every day | RECTAL | Status: DC | PRN

## 2012-09-05 MED ORDER — ACETAMINOPHEN 325 MG PO TABS
650.0000 mg | ORAL_TABLET | Freq: Four times a day (QID) | ORAL | Status: DC | PRN
  Administered 2012-09-06: 650 mg via ORAL
  Filled 2012-09-05: qty 2

## 2012-09-05 MED ORDER — SODIUM CHLORIDE 0.9 % IR SOLN
Status: DC | PRN
  Administered 2012-09-05: 3000 mL

## 2012-09-05 MED ORDER — EPHEDRINE SULFATE 50 MG/ML IJ SOLN
INTRAMUSCULAR | Status: DC | PRN
  Administered 2012-09-05: 5 mg via INTRAVENOUS
  Administered 2012-09-05: 10 mg via INTRAVENOUS
  Administered 2012-09-05 (×4): 5 mg via INTRAVENOUS

## 2012-09-05 MED ORDER — ROCURONIUM BROMIDE 100 MG/10ML IV SOLN
INTRAVENOUS | Status: DC | PRN
  Administered 2012-09-05: 30 mg via INTRAVENOUS

## 2012-09-05 MED ORDER — CEFAZOLIN SODIUM 1-5 GM-% IV SOLN
1.0000 g | Freq: Four times a day (QID) | INTRAVENOUS | Status: DC
  Administered 2012-09-06 (×2): 1 g via INTRAVENOUS
  Filled 2012-09-05 (×4): qty 50

## 2012-09-05 MED ORDER — CEFAZOLIN SODIUM-DEXTROSE 2-3 GM-% IV SOLR
INTRAVENOUS | Status: AC
Start: 2012-09-05 — End: 2012-09-05
  Filled 2012-09-05: qty 50

## 2012-09-05 MED ORDER — TRAMADOL HCL 50 MG PO TABS: 50.0000 mg | ORAL_TABLET | Freq: Four times a day (QID) | ORAL | Status: DC | PRN

## 2012-09-05 MED ORDER — MEPERIDINE HCL 50 MG/ML IJ SOLN: 6.2500 mg | INTRAMUSCULAR | Status: DC | PRN

## 2012-09-05 MED ORDER — PROPOFOL 10 MG/ML IV BOLUS
INTRAVENOUS | Status: DC | PRN
  Administered 2012-09-05: 150 mg via INTRAVENOUS

## 2012-09-05 MED ORDER — ONDANSETRON HCL 4 MG PO TABS: 4.0000 mg | ORAL_TABLET | Freq: Four times a day (QID) | ORAL | Status: DC | PRN

## 2012-09-05 MED ORDER — METOCLOPRAMIDE HCL 5 MG/ML IJ SOLN
INTRAMUSCULAR | Status: DC | PRN
  Administered 2012-09-05: 10 mg via INTRAVENOUS

## 2012-09-05 MED ORDER — LACTATED RINGERS IV SOLN
INTRAVENOUS | Status: DC | PRN
  Administered 2012-09-05: 21:00:00 via INTRAVENOUS

## 2012-09-05 MED ORDER — DIPHENHYDRAMINE HCL 12.5 MG/5ML PO ELIX: 12.5000 mg | ORAL_SOLUTION | ORAL | Status: DC | PRN

## 2012-09-05 MED ORDER — ACETAMINOPHEN 10 MG/ML IV SOLN: 1000.0000 mg | Freq: Once | INTRAVENOUS | Status: DC | PRN

## 2012-09-05 MED ORDER — VANCOMYCIN HCL 1000 MG IV SOLR
INTRAVENOUS | Status: DC | PRN
  Administered 2012-09-05: 3 g

## 2012-09-05 MED ORDER — DEXTROSE 5 % IV SOLN: 3.0000 g | INTRAVENOUS | Status: DC

## 2012-09-05 MED ORDER — OXYCODONE HCL 5 MG PO TABS
5.0000 mg | ORAL_TABLET | ORAL | Status: DC | PRN
  Administered 2012-09-06 – 2012-09-08 (×13): 10 mg via ORAL
  Filled 2012-09-05 (×13): qty 2

## 2012-09-05 MED ORDER — HYDROMORPHONE HCL PF 1 MG/ML IJ SOLN
INTRAMUSCULAR | Status: DC | PRN
  Administered 2012-09-05 (×2): 1 mg via INTRAVENOUS

## 2012-09-05 MED ORDER — OXYCODONE HCL 5 MG/5ML PO SOLN
5.0000 mg | Freq: Once | ORAL | Status: DC | PRN
  Filled 2012-09-05: qty 5

## 2012-09-05 MED ORDER — GLYCOPYRROLATE 0.2 MG/ML IJ SOLN
INTRAMUSCULAR | Status: DC | PRN
  Administered 2012-09-05: 0.6 mg via INTRAVENOUS

## 2012-09-05 MED ORDER — MENTHOL 3 MG MT LOZG
1.0000 | LOZENGE | OROMUCOSAL | Status: DC | PRN
  Filled 2012-09-05: qty 9

## 2012-09-05 MED ORDER — MUPIROCIN 2 % EX OINT
TOPICAL_OINTMENT | Freq: Two times a day (BID) | CUTANEOUS | Status: DC
  Administered 2012-09-05: 15:00:00 via NASAL
  Filled 2012-09-05 (×2): qty 22

## 2012-09-05 MED ORDER — PHENYLEPHRINE HCL 10 MG/ML IJ SOLN
INTRAMUSCULAR | Status: DC | PRN
  Administered 2012-09-05 (×2): 40 ug via INTRAVENOUS
  Administered 2012-09-05 (×3): 80 ug via INTRAVENOUS
  Administered 2012-09-05: 40 ug via INTRAVENOUS

## 2012-09-05 MED ORDER — KCL IN DEXTROSE-NACL 20-5-0.9 MEQ/L-%-% IV SOLN
INTRAVENOUS | Status: DC
  Administered 2012-09-05 – 2012-09-08 (×4): via INTRAVENOUS
  Filled 2012-09-05 (×7): qty 1000

## 2012-09-05 MED ORDER — CEFAZOLIN SODIUM-DEXTROSE 2-3 GM-% IV SOLR
2.0000 g | INTRAVENOUS | Status: AC
  Administered 2012-09-05: 2 g via INTRAVENOUS
  Filled 2012-09-05: qty 50

## 2012-09-05 MED ORDER — SODIUM CHLORIDE 0.9 % IV SOLN
INTRAVENOUS | Status: DC
  Administered 2012-09-05 (×2): via INTRAVENOUS

## 2012-09-05 MED ORDER — HYDROMORPHONE HCL PF 1 MG/ML IJ SOLN: 0.2500 mg | INTRAMUSCULAR | Status: DC | PRN

## 2012-09-05 MED ORDER — 0.9 % SODIUM CHLORIDE (POUR BTL) OPTIME
TOPICAL | Status: DC | PRN
  Administered 2012-09-05: 1000 mL

## 2012-09-05 MED ORDER — METOCLOPRAMIDE HCL 5 MG/ML IJ SOLN: 5.0000 mg | Freq: Three times a day (TID) | INTRAMUSCULAR | Status: DC | PRN

## 2012-09-05 MED ORDER — FENTANYL CITRATE 0.05 MG/ML IJ SOLN
INTRAMUSCULAR | Status: DC | PRN
  Administered 2012-09-05 (×7): 50 ug via INTRAVENOUS

## 2012-09-05 MED ORDER — CHLORHEXIDINE GLUCONATE 4 % EX LIQD: 60.0000 mL | Freq: Once | CUTANEOUS | Status: DC

## 2012-09-05 MED ORDER — NEOSTIGMINE METHYLSULFATE 1 MG/ML IJ SOLN
INTRAMUSCULAR | Status: DC | PRN
  Administered 2012-09-05: 5 mg via INTRAVENOUS

## 2012-09-05 MED ORDER — METOCLOPRAMIDE HCL 10 MG PO TABS: 5.0000 mg | ORAL_TABLET | Freq: Three times a day (TID) | ORAL | Status: DC | PRN

## 2012-09-05 MED ORDER — FLEET ENEMA 7-19 GM/118ML RE ENEM: 1.0000 | ENEMA | Freq: Once | RECTAL | Status: AC | PRN

## 2012-09-05 MED ORDER — ACETAMINOPHEN 650 MG RE SUPP: 650.0000 mg | Freq: Four times a day (QID) | RECTAL | Status: DC | PRN

## 2012-09-05 MED ORDER — MIDAZOLAM HCL 5 MG/5ML IJ SOLN
INTRAMUSCULAR | Status: DC | PRN
  Administered 2012-09-05: 2 mg via INTRAVENOUS

## 2012-09-05 MED ORDER — MORPHINE SULFATE 2 MG/ML IJ SOLN
1.0000 mg | INTRAMUSCULAR | Status: DC | PRN
  Administered 2012-09-05 – 2012-09-06 (×3): 2 mg via INTRAVENOUS
  Filled 2012-09-05 (×3): qty 1

## 2012-09-05 MED ORDER — METHOCARBAMOL 100 MG/ML IJ SOLN
500.0000 mg | Freq: Four times a day (QID) | INTRAVENOUS | Status: DC | PRN
  Filled 2012-09-05 (×2): qty 5

## 2012-09-05 MED ORDER — METHOCARBAMOL 500 MG PO TABS
500.0000 mg | ORAL_TABLET | Freq: Four times a day (QID) | ORAL | Status: DC | PRN
  Administered 2012-09-06 – 2012-09-08 (×5): 500 mg via ORAL
  Filled 2012-09-05 (×5): qty 1

## 2012-09-05 MED ORDER — RIVAROXABAN 10 MG PO TABS
10.0000 mg | ORAL_TABLET | Freq: Every day | ORAL | Status: DC
  Administered 2012-09-06 – 2012-09-08 (×3): 10 mg via ORAL
  Filled 2012-09-05 (×5): qty 1

## 2012-09-05 MED ORDER — ACETAMINOPHEN 10 MG/ML IV SOLN: 1000.0000 mg | Freq: Once | INTRAVENOUS | Status: DC

## 2012-09-05 MED ORDER — ONDANSETRON HCL 4 MG/2ML IJ SOLN: 4.0000 mg | Freq: Four times a day (QID) | INTRAMUSCULAR | Status: DC | PRN

## 2012-09-05 MED ORDER — ACETAMINOPHEN 10 MG/ML IV SOLN
INTRAVENOUS | Status: AC
  Filled 2012-09-05: qty 100

## 2012-09-05 MED ORDER — LACTATED RINGERS IV SOLN
INTRAVENOUS | Status: DC
  Administered 2012-09-05: 20:00:00 via INTRAVENOUS
  Administered 2012-09-05: 1000 mL via INTRAVENOUS
  Administered 2012-09-05: 19:00:00 via INTRAVENOUS

## 2012-09-05 MED ORDER — OXYCODONE HCL 5 MG PO TABS: 5.0000 mg | ORAL_TABLET | Freq: Once | ORAL | Status: DC | PRN

## 2012-09-05 MED ORDER — DOCUSATE SODIUM 100 MG PO CAPS
100.0000 mg | ORAL_CAPSULE | Freq: Two times a day (BID) | ORAL | Status: DC
  Administered 2012-09-06 – 2012-09-08 (×5): 100 mg via ORAL

## 2012-09-05 MED ORDER — ONDANSETRON HCL 4 MG/2ML IJ SOLN
INTRAMUSCULAR | Status: DC | PRN
  Administered 2012-09-05: 4 mg via INTRAVENOUS

## 2012-09-05 SURGICAL SUPPLY — 72 items
BAG ZIPLOCK 12X15 (MISCELLANEOUS) ×6 IMPLANT
BIT DRILL 2.4X128 (BIT) ×2 IMPLANT
BIT DRILL 2.8X128 (BIT) IMPLANT
BIT DRILL 2X127 MARKED (BIT) ×2 IMPLANT
BLADE EXTENDED COATED 6.5IN (ELECTRODE) ×2 IMPLANT
BLADE SAW SAG 73X25 THK (BLADE)
BLADE SAW SGTL 73X25 THK (BLADE) IMPLANT
BUR CROSS CUT FISSURE 1.2 (BURR) ×2 IMPLANT
BUR EGG DIAMOND 4.0 (BURR) ×4 IMPLANT
CEMENT HV SMART SET (Cement) ×6 IMPLANT
CLOTH BEACON ORANGE TIMEOUT ST (SAFETY) ×2 IMPLANT
CONT SPECI 4OZ STER CLIK (MISCELLANEOUS) IMPLANT
DRAPE INCISE IOBAN 66X45 STRL (DRAPES) ×2 IMPLANT
DRAPE ORTHO SPLIT 77X108 STRL (DRAPES) ×2
DRAPE POUCH INSTRU U-SHP 10X18 (DRAPES) ×2 IMPLANT
DRAPE SURG ORHT 6 SPLT 77X108 (DRAPES) ×2 IMPLANT
DRAPE U-SHAPE 47X51 STRL (DRAPES) ×2 IMPLANT
DRSG EMULSION OIL 3X16 NADH (GAUZE/BANDAGES/DRESSINGS) IMPLANT
DRSG MEPILEX BORDER 4X12 (GAUZE/BANDAGES/DRESSINGS) ×4 IMPLANT
DRSG MEPILEX BORDER 4X4 (GAUZE/BANDAGES/DRESSINGS) ×2 IMPLANT
DRSG MEPILEX BORDER 4X8 (GAUZE/BANDAGES/DRESSINGS) IMPLANT
DURAPREP 26ML APPLICATOR (WOUND CARE) ×2 IMPLANT
ELECT REM PT RETURN 9FT ADLT (ELECTROSURGICAL) ×2
ELECTRODE REM PT RTRN 9FT ADLT (ELECTROSURGICAL) ×1 IMPLANT
EVACUATOR 1/8 PVC DRAIN (DRAIN) ×2 IMPLANT
FACESHIELD LNG OPTICON STERILE (SAFETY) ×8 IMPLANT
GLOVE BIO SURGEON STRL SZ7.5 (GLOVE) ×2 IMPLANT
GLOVE BIO SURGEON STRL SZ8 (GLOVE) ×2 IMPLANT
GLOVE BIOGEL PI IND STRL 7.0 (GLOVE) ×2 IMPLANT
GLOVE BIOGEL PI IND STRL 7.5 (GLOVE) ×1 IMPLANT
GLOVE BIOGEL PI IND STRL 8 (GLOVE) ×3 IMPLANT
GLOVE BIOGEL PI INDICATOR 7.0 (GLOVE) ×2
GLOVE BIOGEL PI INDICATOR 7.5 (GLOVE) ×1
GLOVE BIOGEL PI INDICATOR 8 (GLOVE) ×3
GLOVE SURG SS PI 6.5 STRL IVOR (GLOVE) ×8 IMPLANT
GLOVE SURG SS PI 7.0 STRL IVOR (GLOVE) ×2 IMPLANT
GLOVE SURG SS PI 7.5 STRL IVOR (GLOVE) ×2 IMPLANT
GLOVE SURG SS PI 8.0 STRL IVOR (GLOVE) ×2 IMPLANT
GOWN STRL NON-REIN LRG LVL3 (GOWN DISPOSABLE) ×8 IMPLANT
GOWN STRL REIN XL XLG (GOWN DISPOSABLE) ×2 IMPLANT
HANDPIECE INTERPULSE COAX TIP (DISPOSABLE) ×1
HEAD FEM STD 32X+1 STRL (Hips) ×2 IMPLANT
IMMOBILIZER KNEE 20 (SOFTGOODS) ×2
IMMOBILIZER KNEE 20 THIGH 36 (SOFTGOODS) ×1 IMPLANT
IV NS IRRIG 3000ML ARTHROMATIC (IV SOLUTION) ×2 IMPLANT
KIT BASIN OR (CUSTOM PROCEDURE TRAY) ×2 IMPLANT
LINER ACET CUP 42MMX32MM (Hips) ×2 IMPLANT
MANIFOLD NEPTUNE II (INSTRUMENTS) ×2 IMPLANT
NDL SAFETY ECLIPSE 18X1.5 (NEEDLE) IMPLANT
NEEDLE HYPO 18GX1.5 SHARP (NEEDLE)
NS IRRIG 1000ML POUR BTL (IV SOLUTION) ×2 IMPLANT
PACK TOTAL JOINT (CUSTOM PROCEDURE TRAY) ×2 IMPLANT
PASSER SUT SWANSON 36MM LOOP (INSTRUMENTS) ×2 IMPLANT
POSITIONER SURGICAL ARM (MISCELLANEOUS) ×2 IMPLANT
SET HNDPC FAN SPRY TIP SCT (DISPOSABLE) ×1 IMPLANT
SPONGE GAUZE 4X4 12PLY (GAUZE/BANDAGES/DRESSINGS) ×2 IMPLANT
SPONGE LAP 18X18 X RAY DECT (DISPOSABLE) ×4 IMPLANT
STAPLER VISISTAT 35W (STAPLE) ×2 IMPLANT
STEM CEMENTED SUMMIT SZ2 (Stem) ×2 IMPLANT
SUCTION FRAZIER TIP 10 FR DISP (SUCTIONS) ×2 IMPLANT
SUT ETHIBOND NAB CT1 #1 30IN (SUTURE) ×4 IMPLANT
SUT VIC AB 1 CT1 27 (SUTURE) ×3
SUT VIC AB 1 CT1 27XBRD ANTBC (SUTURE) ×3 IMPLANT
SUT VIC AB 2-0 CT1 27 (SUTURE) ×3
SUT VIC AB 2-0 CT1 TAPERPNT 27 (SUTURE) ×3 IMPLANT
SUT VLOC 180 0 24IN GS25 (SUTURE) ×4 IMPLANT
SWAB COLLECTION DEVICE MRSA (MISCELLANEOUS) ×2 IMPLANT
SYR 50ML LL SCALE MARK (SYRINGE) IMPLANT
TOWEL OR 17X26 10 PK STRL BLUE (TOWEL DISPOSABLE) ×4 IMPLANT
TRAY FOLEY CATH 14FRSI W/METER (CATHETERS) ×2 IMPLANT
TUBE ANAEROBIC SPECIMEN COL (MISCELLANEOUS) ×2 IMPLANT
WATER STERILE IRR 1500ML POUR (IV SOLUTION) ×2 IMPLANT

## 2012-09-05 NOTE — Anesthesia Preprocedure Evaluation (Addendum)
Anesthesia Evaluation  Patient identified by MRN, date of birth, ID band Patient awake    Reviewed: Allergy & Precautions, H&P , NPO status , Patient's Chart, lab work & pertinent test results  Airway Mallampati: II TM Distance: >3 FB Neck ROM: Full    Dental no notable dental hx. (+) Chipped and Poor Dentition   Pulmonary sleep apnea ,  breath sounds clear to auscultation  Pulmonary exam normal       Cardiovascular negative cardio ROS  Rhythm:Regular Rate:Normal     Neuro/Psych negative neurological ROS  negative psych ROS   GI/Hepatic Neg liver ROS, hiatal hernia, GERD-  ,  Endo/Other  negative endocrine ROS  Renal/GU negative Renal ROS  negative genitourinary   Musculoskeletal negative musculoskeletal ROS (+)   Abdominal   Peds negative pediatric ROS (+)  Hematology negative hematology ROS (+)   Anesthesia Other Findings   Reproductive/Obstetrics negative OB ROS                           Anesthesia Physical  Anesthesia Plan  ASA: II  Anesthesia Plan: General   Post-op Pain Management:    Induction:   Airway Management Planned: Oral ETT  Additional Equipment:   Intra-op Plan:   Post-operative Plan: Extubation in OR  Informed Consent: I have reviewed the patients History and Physical, chart, labs and discussed the procedure including the risks, benefits and alternatives for the proposed anesthesia with the patient or authorized representative who has indicated his/her understanding and acceptance.   Dental advisory given  Plan Discussed with: CRNA  Anesthesia Plan Comments:       Anesthesia Quick Evaluation

## 2012-09-05 NOTE — Anesthesia Postprocedure Evaluation (Signed)
Anesthesia Post Note  [name]  Procedure(s) Performed: Procedure(s) (LRB): RIGHT HIP RESECTION ARTHROPLASTY WITH ANTIBIOTIC SPACERS (Right)  Anesthesia type: General  Patient location: PACU  Post pain: Pain level controlled  Post assessment: Post-op Vital signs reviewed  Last Vitals: BP 118/66  Pulse 69  Temp(Src) 36.1 C (Oral)  Resp 16  Ht 5\' 6"  (1.676 m)  Wt 191 lb 6 oz (86.807 kg)  BMI 30.9 kg/m2  SpO2 96%  Post vital signs: Reviewed  Level of consciousness: sedated  Complications: No apparent anesthesia complications

## 2012-09-05 NOTE — Preoperative (Signed)
Beta Blockers   Reason not to administer Beta Blockers:Not Applicable, not on home BB 

## 2012-09-05 NOTE — H&P (Signed)
Steve [name] is an 57 y.o. male.   Chief Complaint: Infected right hip replacement HPI: Steve [name] is a 57 yo male who presented to the office approximately 2 weeks ago with swelling and redness in his right hip. He has not had any recent injuries. He denies fever, chills or constitutional symptoms. He had a significant dental infection this past year and had bacteremia associated with it. He did not develop any hip symptoms initially after this but gradually developed this swelling approximately a year later.He was sent for an aspiration which did not reveal any bacteria. Also had a MARS MRI which showed a periarticular fluid collection. I performed a hip aspiration on him last week in the office and the culture grew MSSA. I discussed the implications of this with the patient and gave him the options of 2 stage revision, I & D with bearing surface exchange and observation and he opted for the 2 stage revision. He presents today for resection arthroplasty with antibiotic spacer placement.  Past Medical History  Diagnosis Date  . Sleep apnea     cpap not available  . GERD (gastroesophageal reflux disease)     rare reflux - no meds for reflux  . Abscess of muscle 08/10/2011    staph infection of right hip   . Hip dysplasia, congenital     no surgery as a child for hip dysplasia - has had bilateral hip replacements as an adult    Past Surgical History  Procedure Laterality Date  . Hernia repair      inguinal hernia x3  . Joint replacement      bilateral hip replacement  . Tee without cardioversion  07/29/2011    Procedure: TRANSESOPHAGEAL ECHOCARDIOGRAM (TEE);  Surgeon: Josue Hector, MD;  Location: [facility] ENDOSCOPY;  Service: Cardiovascular;  Laterality: N/A;  . Multiple extractions with alveoloplasty  07/28/2011    Procedure: MULTIPLE EXTRACION WITH ALVEOLOPLASTY;  Surgeon: Lenn Cal, DDS;  Location: [facility] ORS;  Service: Oral Surgery;  Laterality: N/A;  Extraction of tooth #'s  2,3,4,5,6,11,12,13,15,19,22 with alveoloplasty.  . Shoulder repair - right for separation of shoulder      Family History  Problem Relation Age of Onset  . Melanoma Mother   . Heart attack Mother   . Hyperlipidemia Neg Hx   . Sudden death Neg Hx   . Hypertension Neg Hx   . Diabetes Neg Hx    Social History:  reports that he has never smoked. He has never used smokeless tobacco. He reports that  drinks alcohol. He reports that he does not use illicit drugs.  Allergies: No Known Allergies  Medications Prior to Admission  Medication Sig Dispense Refill  . HYDROcodone-acetaminophen (NORCO) 7.5-325 MG per tablet Take 1 tablet by mouth every 6 (six) hours as needed for pain.      Marland Kitchen ibuprofen (ADVIL,MOTRIN) 200 MG tablet Take 200 mg by mouth. 3 tablets once a day if needed for pain      . naproxen sodium (ANAPROX) 220 MG tablet Take 220 mg by mouth. One tablet daily if needed for pain      . diclofenac (VOLTAREN) 75 MG EC tablet Take 75 mg by mouth 2 (two) times daily.        Results for orders placed during the hospital encounter of 09/05/12 (from the past 48 hour(s))  URINALYSIS, ROUTINE W REFLEX MICROSCOPIC     Status: None   Collection Time    09/05/12  2:20 PM  Result Value Range   Color, Urine YELLOW  YELLOW   APPearance CLEAR  CLEAR   Specific Gravity, Urine 1.027  1.005 - 1.030   pH 5.5  5.0 - 8.0   Glucose, UA NEGATIVE  NEGATIVE mg/dL   Hgb urine dipstick NEGATIVE  NEGATIVE   Bilirubin Urine NEGATIVE  NEGATIVE   Ketones, ur NEGATIVE  NEGATIVE mg/dL   Protein, ur NEGATIVE  NEGATIVE mg/dL   Urobilinogen, UA 0.2  0.0 - 1.0 mg/dL   Nitrite NEGATIVE  NEGATIVE   Leukocytes, UA NEGATIVE  NEGATIVE   Comment: MICROSCOPIC NOT DONE ON URINES WITH NEGATIVE PROTEIN, BLOOD, LEUKOCYTES, NITRITE, OR GLUCOSE <1000 mg/dL.  SURGICAL PCR SCREEN     Status: Abnormal   Collection Time    09/05/12  2:25 PM      Result Value Range   MRSA, PCR NEGATIVE  NEGATIVE   Staphylococcus aureus  POSITIVE (*) NEGATIVE   Comment:            The Xpert SA Assay (FDA     approved for NASAL specimens     in patients over 59 years of age),     is one component of     a comprehensive surveillance     program.  Test performance has     been validated by [employer] for patients greater     than or equal to 47 year old.     It is not intended     to diagnose infection nor to     guide or monitor treatment.  APTT     Status: Abnormal   Collection Time    09/05/12  2:30 PM      Result Value Range   aPTT 41 (*) 24 - 37 seconds   Comment:            IF BASELINE aPTT IS ELEVATED,     SUGGEST PATIENT RISK ASSESSMENT     BE USED TO DETERMINE APPROPRIATE     ANTICOAGULANT THERAPY.  CBC     Status: Abnormal   Collection Time    09/05/12  2:30 PM      Result Value Range   WBC 9.0  4.0 - 10.5 K/uL   RBC 4.53  4.22 - 5.81 MIL/uL   Hemoglobin 11.6 (*) 13.0 - 17.0 g/dL   HCT 35.8 (*) 39.0 - 52.0 %   MCV 79.0  78.0 - 100.0 fL   MCH 25.6 (*) 26.0 - 34.0 pg   MCHC 32.4  30.0 - 36.0 g/dL   RDW 16.1 (*) 11.5 - 15.5 %   Platelets 328  150 - 400 K/uL  COMPREHENSIVE METABOLIC PANEL     Status: Abnormal   Collection Time    09/05/12  2:30 PM      Result Value Range   Sodium 139  135 - 145 mEq/L   Potassium 3.8  3.5 - 5.1 mEq/L   Chloride 102  96 - 112 mEq/L   CO2 27  19 - 32 mEq/L   Glucose, Bld 104 (*) 70 - 99 mg/dL   BUN 12  6 - 23 mg/dL   Creatinine, Ser 0.79  0.50 - 1.35 mg/dL   Calcium 9.2  8.4 - 10.5 mg/dL   Total Protein 8.1  6.0 - 8.3 g/dL   Albumin 3.6  3.5 - 5.2 g/dL   AST 13  0 - 37 U/L   ALT 10  0 - 53 U/L  Alkaline Phosphatase 71  39 - 117 U/L   Total Bilirubin 0.7  0.3 - 1.2 mg/dL   GFR calc non Af Amer >90  >90 mL/min   GFR calc Af Amer >90  >90 mL/min   Comment:            The eGFR has been calculated     using the CKD EPI equation.     This calculation has not been     validated in all clinical     situations.     eGFR's persistently     <90 mL/min  signify     possible Chronic Kidney Disease.  PROTIME-INR     Status: None   Collection Time    09/05/12  2:30 PM      Result Value Range   Prothrombin Time 14.2  11.6 - 15.2 seconds   INR 1.11  0.00 - 1.49  TYPE AND SCREEN     Status: None   Collection Time    09/05/12  2:30 PM      Result Value Range   ABO/RH(D) A NEG     Antibody Screen NEG     Sample Expiration 09/08/2012    GLUCOSE, CAPILLARY     Status: None   Collection Time    09/05/12  2:39 PM      Result Value Range   Glucose-Capillary 97  70 - 99 mg/dL   Dg Hip Complete Right  09/05/2012   *RADIOLOGY REPORT*  Clinical Data: Preop of infected total hip arthroplasty.  RIGHT HIP - COMPLETE 2+ VIEW  Comparison: None.  Findings: The patient is status post bilateral hip arthroplasties. No periprosthetic fracture.  IMPRESSION: Bilateral hip arthroplasties.  No acute findings.   Original Report Authenticated By: Lorin Picket, M.D.    Review of Systems  Constitutional: Negative.   HENT: Negative.   Skin: Negative.   All other systems reviewed and are negative.    Blood pressure 139/78, pulse 65, temperature 98.1 F (36.7 C), temperature source Oral, resp. rate 20, height 5\' 6"  (1.676 m), weight 191 lb 6 oz (86.807 kg), SpO2 97.00%. Physical Exam  Physical Examination: General appearance - alert, well appearing, and in no distress Mental status - alert, oriented to person, place, and time Neck - supple, no significant adenopathy Lymphatics - no palpable lymphadenopathy, no hepatosplenomegaly Chest - clear to auscultation, no wheezes, rales or rhonchi, symmetric air entry Heart - normal rate, regular rhythm, normal S1, S2, no murmurs, rubs, clicks or gallops Abdomen - soft, nontender, nondistended, no masses or organomegaly Neurological - alert, oriented, normal speech, no focal findings or movement disorder noted Musculoskeletal - no joint tenderness, deformity or swelling, except for right hip which is swollen and  warm Extremities - peripheral pulses normal, no pedal edema, no clubbing or cyanosis Skin - normal coloration and turgor, no rashes, no suspicious skin lesions noted   Assessment/Plan Infected right THA- Plan resection arthroplasty with antibiotic spacer placement. Discussed procedure, risks and potential complications with the patient who elects to proceed.  [name] 09/05/2012, 5:20 PM

## 2012-09-05 NOTE — Interval H&P Note (Signed)
History and Physical Interval Note:  09/05/2012 5:32 PM  Steve [name]  has presented today for surgery, with the diagnosis of INFECTED RIGHT HIP TOTAL ARTHROPLASTY  The various methods of treatment have been discussed with the patient and family. After consideration of risks, benefits and other options for treatment, the patient has consented to  Procedure(s): RIGHT HIP RESECTION ARTHROPLASTY WITH ANTIBIOTIC SPACERS (Right) as a surgical intervention .  The patient's history has been reviewed, patient examined, no change in status, stable for surgery.  I have reviewed the patient's chart and labs.  Questions were answered to the patient's satisfaction.     [name]

## 2012-09-05 NOTE — Transfer of Care (Signed)
Immediate Anesthesia Transfer of Care Note  [name]  Procedure(s) Performed: Procedure(s): RIGHT HIP RESECTION ARTHROPLASTY WITH ANTIBIOTIC SPACERS (Right)  Patient Location: PACU  Anesthesia Type:General  Level of Consciousness: awake, responsive, cooperative, ventilating well  Airway & Oxygen Therapy: Patient Spontanous Breathing and Patient connected to face mask oxygen  Post-op Assessment: Report given to PACU RN, Post -op Vital signs reviewed and stable and Patient moving all extremities X 4  Post vital signs: Reviewed and stable  Complications: No apparent anesthesia complications

## 2012-09-05 NOTE — Brief Op Note (Signed)
09/05/2012  8:57 PM  PATIENT:  Steve [name]  57 y.o. male  PRE-OPERATIVE DIAGNOSIS:  INFECTED RIGHT HIP TOTAL ARTHROPLASTY  POST-OPERATIVE DIAGNOSIS:  infected right total hip arthroplasty  PROCEDURE:  Procedure(s): RIGHT HIP RESECTION ARTHROPLASTY WITH ANTIBIOTIC SPACERS (Right)  SURGEON:  Surgeon(s) and Role:    * [name], MD - Primary  PHYSICIAN ASSISTANT:   ASSISTANTS: Arlee Muslim, PA-C   ANESTHESIA:   general  EBL:  Total I/O In: 884 [I.V.:250; Blood:634] Out: [id number] [Urine:120; Blood:1300]  BLOOD ADMINISTERED:1200 CC PRBC  DRAINS: (Medium) Hemovact drain(s) in the right hip with  Suction Open   LOCAL MEDICATIONS USED:  NONE  COUNTS:  YES  TOURNIQUET:  * No tourniquets in log *  DICTATION: .Other Dictation: Dictation Number [id number]  PLAN OF CARE: Admit to inpatient   PATIENT DISPOSITION:  PACU - hemodynamically stable.

## 2012-09-06 DIAGNOSIS — T8450XA Infection and inflammatory reaction due to unspecified internal joint prosthesis, initial encounter: Principal | ICD-10-CM

## 2012-09-06 LAB — POCT I-STAT 4, (NA,K, GLUC, HGB,HCT)
Hemoglobin: 12.2 g/dL — ABNORMAL LOW (ref 13.0–17.0)
Potassium: 4.4 mEq/L (ref 3.5–5.1)
Sodium: 140 mEq/L (ref 135–145)

## 2012-09-06 LAB — BASIC METABOLIC PANEL
Calcium: 8 mg/dL — ABNORMAL LOW (ref 8.4–10.5)
Creatinine, Ser: 0.75 mg/dL (ref 0.50–1.35)
GFR calc Af Amer: >90 mL/min (ref >90)
GFR calc non Af Amer: >90 mL/min (ref >90)
Sodium: 137 mEq/L (ref 135–145)

## 2012-09-06 LAB — CBC
MCH: 26.1 pg (ref 26.0–34.0)
MCHC: 32.6 g/dL (ref 30.0–36.0)
MCV: 80 fL (ref 78.0–100.0)
Platelets: 252 10*3/uL (ref 150–400)
RBC: 4.41 MIL/uL (ref 4.22–5.81)
RDW: 15.9 % — ABNORMAL HIGH (ref 11.5–15.5)

## 2012-09-06 MED ORDER — SODIUM CHLORIDE 0.9 % IJ SOLN
10.0000 mL | INTRAMUSCULAR | Status: DC | PRN
  Administered 2012-09-08: 10 mL

## 2012-09-06 MED ORDER — CEFAZOLIN SODIUM-DEXTROSE 2-3 GM-% IV SOLR
2.0000 g | Freq: Three times a day (TID) | INTRAVENOUS | Status: DC
  Administered 2012-09-06 – 2012-09-08 (×6): 2 g via INTRAVENOUS
  Filled 2012-09-06 (×8): qty 50

## 2012-09-06 NOTE — Care Management Note (Addendum)
CARE MANAGEMENT NOTE 09/06/2012  Patient:  Steve [name], Steve [name]   Account Number:  [id number]  Date Initiated:  09/06/2012  Documentation initiated by:  Katherine Tout  Subjective/Objective Assessment:   57 yo male admitted s/p RIGHT HIP RESECTION ARTHROPLASTY WITH ANTIBIOTIC SPACERS. PTA pt independent.     Action/Plan:   Home with [place] services.   Anticipated DC Date:     Anticipated DC Plan:  [place]  CM consult      Choice offered to / List presented to:  [name]   DME arranged  Vassie Moselle      DME agency  [place] arranged  [facility] RN      [place].   Status of service:  In process, will continue to follow Medicare Important Message given?   (If response is "NO", the following Medicare IM given date fields will be blank) Date Medicare IM given:   Date Additional Medicare IM given:    Discharge Disposition:    Per UR Regulation:  Reviewed for med. necessity/level of care/duration of stay  If discussed at [place] of Stay Meetings, dates discussed:    Comments:  09/06/12 [place] [id number] Cm spoke with patient concerning discharge planning. PT recommendations for HHPT. Pt offered choice for [facility]. Per pt choice [facility] to provide [facility] services at discharge. Pt to dc home with sister. [place] notified of referral. pt request RW. Awaiting Md order for DME. DME delivery scheduled to room prior to discharge.

## 2012-09-06 NOTE — Evaluation (Signed)
Occupational Therapy Evaluation Patient Details Name: Steve [name] MRN: [id number] DOB: April 08, 1956 Today's Date: 09/06/2012 Time: [id number] OT Time Calculation (min): 23 min  OT Assessment / Plan / Recommendation Clinical Impression  Pt is s/p resection of R hip with spacer placed and displays increased pain and overall a decrease in functional mobility and ADL. He plans to go to his sister's at discharge and will benefit from skilled OT services to improve ADL independence for discharge.     OT Assessment  Patient needs continued OT Services    Follow Up Recommendations  Home health OT;Supervision/Assistance - 24 hour    Barriers to Discharge      Equipment Recommendations  3 in 1 bedside comode    Recommendations for Other Services    Frequency  Min 2X/week    Precautions / Restrictions Precautions Precautions: Fall;Posterior Hip Restrictions Weight Bearing Restrictions: Yes RLE Weight Bearing: Partial weight bearing RLE Partial Weight Bearing Percentage or Pounds:  (25-50%)        ADL  Eating/Feeding: Independent Where Assessed - Eating/Feeding: Chair Grooming: Wash/dry hands;Set up Where Assessed - Grooming: Supported sitting Upper Body Bathing: Chest;Right arm;Left arm;Abdomen;Set up;Supervision/safety (alittle dizzy at EOb. supervision for safety) Where Assessed - Upper Body Bathing: Unsupported sitting Lower Body Bathing: Moderate assistance (without AE) Where Assessed - Lower Body Bathing: Supported sit to stand Upper Body Dressing: Set up;Supervision/safety Where Assessed - Upper Body Dressing: Unsupported sitting Lower Body Dressing: Maximal assistance (without AE) Where Assessed - Lower Body Dressing: Supported sit to stand Toilet Transfer: Minimal assistance Toilet Transfer Method: Sit to stand;Other (comment) (walked with PT in hallway. see PT notes) Toileting - Clothing Manipulation and Hygiene: Minimal assistance Where Assessed - Toileting Clothing  Manipulation and Hygiene: Standing Equipment Used: Rolling walker ADL Comments: reviewed all hip precautions with pt. Introduced idea of AE for LB ADL but didnt demonstrate AE yet. Pt interested in using AE so will practice with pt next visit. Pt motivated and did well this visit.     OT Diagnosis: Generalized weakness;Acute pain  OT Problem List: Decreased strength;Decreased knowledge of precautions;Pain;Decreased knowledge of use of DME or AE OT Treatment Interventions: Self-care/ADL training;Therapeutic activities;DME and/or AE instruction;Patient/family education   OT Goals Acute Rehab OT Goals OT Goal Formulation: With patient Time For Goal Achievement: 09/13/12 Potential to Achieve Goals: Good ADL Goals Pt Will Perform Grooming: with supervision;Standing at sink ADL Goal: Grooming - Progress: Goal set today Pt Will Perform Lower Body Bathing: with supervision;Sit to stand from bed;Sit to stand from chair;with adaptive equipment ADL Goal: Lower Body Bathing - Progress: Goal set today Pt Will Perform Lower Body Dressing: with supervision;with adaptive equipment;Sit to stand from bed;Sit to stand from chair ADL Goal: Lower Body Dressing - Progress: Goal set today Pt Will Transfer to Toilet: with supervision;3-in-1;with DME;Ambulation ADL Goal: Toilet Transfer - Progress: Goal set today Pt Will Perform Toileting - Clothing Manipulation: with supervision;Standing ADL Goal: Toileting - Clothing Manipulation - Progress: Goal set today  Visit Information  Last OT Received On: 09/06/12 Assistance Needed: +1 PT/OT Co-Evaluation/Treatment: Yes    Subjective Data  Subjective: pt stating he hasnt been up yet Patient Stated Goal: agreeable to PT/OT   Prior [place] Lives With: Spouse;Family (wife had a baby 2 days) Available Help at Discharge: Family (staying with sister and bro in law) Type of Home: House Home Access: Stairs to enter [employer] of  Steps: 5 Entrance Stairs-Rails: Right;Left;Can reach both Home Layout: One  level Bathroom Shower/Tub: Probation officer: Reacher Prior Function Level of Independence: Independent Able to Take Stairs?: Yes Driving: Yes Vocation: Full time employment Communication Communication: No difficulties         Vision/Perception     Cognition  Cognition Arousal/Alertness: Awake/alert Behavior During Therapy: WFL for tasks assessed/performed Overall Cognitive Status: Within Functional Limits for tasks assessed    Extremity/Trunk Assessment Right Upper Extremity Assessment RUE ROM/Strength/Tone: [facility] for tasks assessed Left Upper Extremity Assessment LUE ROM/Strength/Tone: WFL for tasks assessed     Mobility Bed Mobility Bed Mobility: Supine to Sit Supine to Sit: 3: Mod assist;Other (comment) (used sheet as leg lifter to help R LE over to EOB) Details for Bed Mobility Assistance: needed some assist to manage R LE over to EOB with pt also using sheet as leg lifter. verbal cues for THPs. Transfers Transfers: Sit to Stand;Stand to Sit Sit to Stand: 4: Min assist;With upper extremity assist;From bed Stand to Sit: 4: Min assist;With upper extremity assist;To chair/3-in-1 Details for Transfer Assistance: verbal cues for THPs and safety     Exercise     Balance     End of Session OT - End of Session Activity Tolerance: Patient limited by pain Patient left: in chair;with call bell/phone within reach  [place], [place] [id number] 09/06/2012, 11:55 AM

## 2012-09-06 NOTE — Progress Notes (Signed)
PPT Cancellation Note  Patient Details Name: Steve [name] MRN: [id number] DOB: 06/14/1955   Cancelled Treatment:     Pt had increased pain earlier and wants to rest. Will see in AM   Claretha Cooper 09/06/2012, 4:03 PM

## 2012-09-06 NOTE — Progress Notes (Signed)
[facility]  Patient Status: Steve [name] is a new pt for [facility].   [facility] is providing the following services: [facility] will provide [place], PT and [facility] pharmacy for home IV antibiotics.  [facility] will follow pt and support D/C home when ordered by MD.  If patient discharges after hours, please call [phone].   Larry Sierras 09/06/2012, 5:23 PM

## 2012-09-06 NOTE — Evaluation (Signed)
Physical Therapy Evaluation Patient Details Name: Steve [name] MRN: [id number] DOB: 09-03-1955 Today's Date: 09/06/2012 Time: [id number] PT Time Calculation (min): 22 min  PT Assessment / Plan / Recommendation Clinical Impression  53 yomale admitted5/28/14 for infection of RTHA with removal and placement of antibiotic spacer. Pt was able to ambulate 50 ' and did well. Pt plans to DC to stay at sister's home. Pt will benefit  from PT while in acute care.     PT Assessment  Patient needs continued PT services    Follow Up Recommendations  Home health PT    Does the patient have the potential to tolerate intense rehabilitation      Barriers to Discharge Decreased caregiver support wife had baby 2 days ago    Equipment Recommendations  Rolling walker with 5" wheels    Recommendations for Other Services     Frequency 7X/week    Precautions / Restrictions Precautions Precautions: Fall;Posterior Hip Restrictions Weight Bearing Restrictions: Yes RLE Weight Bearing: Partial weight bearing RLE Partial Weight Bearing Percentage or Pounds:  (25-50%)   Pertinent Vitals/Pain 10 R hip. Just medicated.      Mobility  Bed Mobility Bed Mobility: Supine to Sit Supine to Sit: 3: Mod assist;Other (comment) (used sheet as leg lifter to help R LE over to EOB) Details for Bed Mobility Assistance: needed some assist to manage R LE over to EOB with pt also using sheet as leg lifter. verbal cues for THPs. Transfers Sit to Stand: 4: Min assist;With upper extremity assist;From bed Stand to Sit: 4: Min assist;With upper extremity assist;To chair/3-in-1 Details for Transfer Assistance: verbal cues for THPs and safety Ambulation/Gait Ambulation/Gait Assistance: 4: Min assist Ambulation Distance (Feet): 50 Feet Assistive device: Rolling walker Ambulation/Gait Assistance Details: cues for sequence and PWB Gait Pattern: Step-to pattern;Decreased step length - right;Antalgic    Exercises      PT Diagnosis: Difficulty walking;Generalized weakness  PT Problem List: Decreased strength;Decreased range of motion;Decreased activity tolerance;Decreased knowledge of use of DME;Decreased safety awareness;Decreased knowledge of precautions;Decreased mobility PT Treatment Interventions: DME instruction;Gait training;Functional mobility training;Therapeutic activities;Therapeutic exercise;Stair training   PT Goals Acute Rehab PT Goals PT Goal Formulation: With patient Time For Goal Achievement: 09/13/12 Potential to Achieve Goals: Good Pt will go Supine/Side to Sit: with modified independence PT Goal: Supine/Side to Sit - Progress: Goal set today Pt will go Sit to Supine/Side: with modified independence PT Goal: Sit to Supine/Side - Progress: Goal set today Pt will go Sit to Stand: with modified independence PT Goal: Sit to Stand - Progress: Goal set today Pt will go Stand to Sit: with modified independence PT Goal: Stand to Sit - Progress: Goal set today Pt will Ambulate: 51 - 150 feet;with supervision;with least restrictive assistive device Pt will Go Up / Down Stairs: 3-5 stairs;with min assist;with least restrictive assistive device PT Goal: Up/Down Stairs - Progress: Goal set today  Visit Information  Last PT Received On: 09/06/12 Assistance Needed: +1    Subjective Data  Subjective: IT HURTS PRETTY GOOD.  Patient Stated Goal: [place].   Prior Functioning  Home Living Lives With: Spouse;Family (wife had a baby 2 days) Available Help at Discharge: Family (staying with sister and bro in law) Type of Home: House Home Access: Stairs to enter [employer] of Steps: 5 Entrance Stairs-Rails: Right;Left;Can reach both Home Layout: One level Bathroom Shower/Tub: Chiropodist: Standard Home Adaptive Equipment: Reacher Prior Function Level of Independence: Independent Able to Take Stairs?: Yes Driving:  Yes Vocation: Full time  employment Communication Communication: No difficulties    Cognition  Cognition Arousal/Alertness: Awake/alert Behavior During Therapy: WFL for tasks assessed/performed Overall Cognitive Status: Within Functional Limits for tasks assessed    Extremity/Trunk Assessment Right Upper Extremity Assessment RUE ROM/Strength/Tone: WFL for tasks assessed Left Upper Extremity Assessment LUE ROM/Strength/Tone: WFL for tasks assessed Right Lower Extremity Assessment RLE ROM/Strength/Tone: Deficits RLE ROM/Strength/Tone Deficits: pt required assistance to move leg to edge of bed, RLE Sensation: WFL - Light Touch Left Lower Extremity Assessment LLE ROM/Strength/Tone: WFL for tasks assessed Trunk Assessment Trunk Assessment: Normal   Balance    End of Session PT - End of Session Activity Tolerance: Patient tolerated treatment well Patient left: in chair;with call bell/phone within reach Nurse Communication: Mobility status  GP     Claretha Cooper 09/06/2012, 1:36 PM

## 2012-09-06 NOTE — Op Note (Signed)
NAMEJAVIAN, Steve [name]             ACCOUNT NO.:  [id number]  MEDICAL RECORD NO.:  [id number]  LOCATION:  24                         FACILITY:  [facility]  PHYSICIAN:  Gaynelle Arabian, M.D.    DATE OF BIRTH:  [birth year]  DATE OF PROCEDURE:  09/05/2012 DATE OF DISCHARGE:                              OPERATIVE REPORT   PREOPERATIVE DIAGNOSIS:  Infected right total hip arthroplasty.  POSTOPERATIVE DIAGNOSIS:  Infected right total hip arthroplasty.  PROCEDURE:  Right hip resection arthroplasty with placement of antibiotic spacer.  SURGEON:  Gaynelle Arabian, M.D.  ASSISTANT:  Steve [name], P.A.C.  ANESTHESIA:  General.  ESTIMATED BLOOD LOSS:  Approximately 2 L.  DRAINS:  Hemovac x1.  REPLACEMENT:  Four units of packed red blood cells, approximately 2 L of saline.  COMPLICATIONS:  None.  CONDITION:  Stable to recovery.  BRIEF CLINICAL NOTE:  Steve [name] is a 57 year old male, who had a total hip arthroplasty done several years ago.  Had absolutely no difficulties. Last year, he had a dental infection, which led to bacteremia.  He did not have any symptoms of hip until recently.  He develops swelling and warmth about the hip about 3-4 weeks ago.  He was seen in the office 2 weeks ago.  Sent for aspiration, which showed no infection.  I saw him in the office a week later and aspirated the hip, and it grew methicillin-sensitive Staph aureus.  He presents today for resection arthroplasty with placement of antibiotic spacer.  PROCEDURE IN DETAIL:  After successful administration of general anesthetic, the patient was placed in the left lateral decubitus position in the right side up and held with the hip positioner.  Right lower extremity was isolated from his perineum with plastic drapes and prepped and draped in the usual sterile fashion.  Short posterolateral incision was made with a 10 blade through subcutaneous tissue to the fascia lata, which was incised in line with the skin  incision.  He had a lot of friable tissue, which was hypervascular.  He had a lot of oozing, thus accounting for the blood loss.  He had a lot of fibrinous tissue, which was all debrided to normal-appearing tissue.  There was no evidence of any necrotic tissue.  There was pus evident in the joint, and then culture sent for stat Gram stain, culture and sensitivity.  We then were able to dislocate his native hip.  I removed the femoral head off of the femoral neck.  He has an SROM stem, and I used the SROM osteotome to disrupt the interface between the stem and sleeve.  The stem was then easily removed.  The sleeve was left intact for the time being.  The femurs was retracted anteriorly to gain acetabular exposure. Acetabular retractors were subsequently placed.  I was able to remove the metal liner from the pinnacle acetabular shell.  There was difficulty in attempts to remove the screw from the shell because the screw head was stripped.  In addition, the apex hole eliminator was stripped.  We attempted with multiple tools to remove the screw in apex hole eliminator.  Also tried a high-speed diamond bur to place holes, and we could not.  I  disrupted the interface between the acetabular component and bone with osteotomes with the [facility] cement with revision osteotomes.  We got to the point where the bone was completely off of this shell.  The shell was loose and toggling.  I was able to toggle it enough where I guides over the screw head, and we removed the cup without any bone loss.  I then used a large trephine to drill over the screw and was able to remove the screw.  I reamed the acetabular bed with 55 mm reamer.  There was a medial defect, but the columns were intact.  We then mixed 1 g of vancomycin of 1 batch of cement, and I subsequently cemented the Prostalac all-poly cup into the acetabular bed and did this in about 40 degrees of abduction, 25 degrees of anteversion.  It was a  40 to cup for a 32 internal diameter head.  Once the cement had fully hardened, we attempted to remove the sleeve from the proximal femur.  I used a high-speed bur, multiple osteotomes, flexible osteotomes and could not get this sleeve to even budge.  We went to the point where it was causing stress risers in the proximal femur.  I was very concerned about fracturing.  I felt that with such a well-fixed component, we would easily be able to coat it with antibiotic cement and thus prevent bacteria from adhering to it.  I decided to leave it in because I felt it would could far more destruction by taking it off.  We then used a size 2 Summit basic cemented stem to service as the endoskeleton for the Prostalac femoral component.  We mixed 2 batches of cement with 2 g of vancomycin.  I coated the stem with the cement such that the only thing that was visible was the femoral neck. We also coated the femoral sleeve so there was no metal visible.  Once the cement started to hardened, I placed the stem into the canal and then used more cement proximally to fix it in place.  I cemented in about 20 degrees of anteversion.  When the cement had fully hardened, I placed a 32+ 1 femoral head and reduced it into the Prostalac liner with a snap fit for stability.  I then irrigated the wound bed with 3 liters of saline solution with pulsatile lavage.  I inspected for any other abnormal tissue and had it subsequently removed.  The fascia lata was then closed over a Hemovac drain with a running #1 V-Loc suture.  Subcu was closed with V-Loc as well as 2-0 Vicryl.  Skin was closed with staples.  The drain was hooked to suction.  Incision was cleaned and dried, and a bulky sterile dressing applied.  He was placed into a knee immobilizer, awakened and transported to recovery in stable condition.  Please note that a surgical assistant was a medical necessity for this procedure in order to perform in a safe and  expeditious manner.  The surgical assistant was necessary for retraction of vital neurovascular structures and for exposure of the femur and acetabulum for safe removal of the components and for safe placement of the spacer.     Gaynelle Arabian, M.D.     FA/MEDQ  D:  09/05/2012  T:  09/06/2012  Job:  [id number]

## 2012-09-06 NOTE — Progress Notes (Signed)
Peripherally Inserted Central Catheter/Midline Placement  The IV Nurse has discussed with the patient and/or persons authorized to consent for the patient, the purpose of this procedure and the potential benefits and risks involved with this procedure.  The benefits include less needle sticks, lab draws from the catheter and patient may be discharged home with the catheter.  Risks include, but not limited to, infection, bleeding, blood clot (thrombus formation), and puncture of an artery; nerve damage and irregular heat beat.  Alternatives to this procedure were also discussed.  PICC/Midline Placement Documentation  PICC / Midline Single Lumen [id number] PICC Right Basilic (Active)       Ambrea Hegler, Nicolette Bang 09/06/2012, 12:31 PM

## 2012-09-06 NOTE — Consult Note (Signed)
[facility] for Infectious Disease     Reason for Consult: PJI s/p resection arthroplasty    Referring Physician: Dr. Maureen Ralphs  Principal Problem:   Septic arthritis of hip   .  ceFAZolin (ANCEF) IV  1 g Intravenous Q6H  . docusate sodium  100 mg Oral BID  . 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   He had an abscess in his right hip last year with MSSA and unfortunately now is in the hip joint.  He is now s/p resection arthroplasty.    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  Last year, he developed fever and noted to have abscess in right hip area with MSSA bacteremia.  He completed 6 weeks of IV Ancef and rifampin successfully and was doing well until about 2 weeks ago.  He had progressive symptoms and aspiration last week revealed MSSA infection.  He was brought in yesterday to do resection arthroplasty with spacer placement.  He was placed on Ancef.     Review of Systems: A comprehensive review of systems was negative.  Past Medical History  Diagnosis Date  . Sleep apnea     cpap not available  . GERD (gastroesophageal reflux disease)     rare reflux - no meds for reflux  . Abscess of muscle 08/10/2011    staph infection of right hip   . Hip dysplasia, congenital     no surgery as a child for hip dysplasia - has had bilateral hip replacements as an adult  . Septic arthritis of hip 09/05/2012    History  Substance Use Topics  . Smoking status: Never Smoker     . Smokeless tobacco: Never Used  . Alcohol Use: 0.0 oz/week     Comment: rare beer    Family History  Problem Relation Age of Onset  . Melanoma Mother   . Heart attack Mother   . Hyperlipidemia Neg Hx   . Sudden death Neg Hx   . Hypertension Neg Hx   . Diabetes Neg Hx    No Known Allergies  OBJECTIVE: Blood pressure 110/74, pulse 83, temperature 98.7 F (37.1 C), temperature source Oral, resp. rate 18, height 5\' 6"  (1.676 m), weight 191 lb 6 oz (86.807 kg), SpO2 98.00%. General: Awake, alert, nad Skin: no rashes Lungs: CTA B Cor: RRR w/ 2/6 SEM Abdomen: soft, nt, nd, +bs    Microbiology: Recent Results (from the past 240 hour(s))  SURGICAL PCR SCREEN     Status: Abnormal   Collection Time    09/05/12  2:25 PM      Result Value Range Status   MRSA, PCR NEGATIVE  NEGATIVE Final   Staphylococcus aureus POSITIVE (*) NEGATIVE Final   Comment:            The Xpert SA Assay (FDA     approved for NASAL specimens     in patients over 21 years  of age),     is one component of     a comprehensive surveillance     program.  Test performance has     been validated by [employer] for patients greater     than or equal to 74 year old.     It is not intended     to diagnose infection nor to     guide or monitor treatment.  ANAEROBIC CULTURE     Status: None   Collection Time    09/05/12  6:23 PM      Result Value Range Status   Specimen Description HIP RIGHT   Final   Special Requests NONE   Final   Gram Stain     Final   Value: NO WBC SEEN     NO SQUAMOUS EPITHELIAL CELLS SEEN     NO ORGANISMS SEEN   Culture     Final   Value: NO ANAEROBES ISOLATED; CULTURE IN PROGRESS FOR 5 DAYS   Report Status PENDING   Incomplete  WOUND CULTURE     Status: None   Collection Time    09/05/12  6:23 PM      Result Value Range Status   Specimen Description WOUND   Final   Special Requests HIP RIGHT   Final   Gram Stain     Final   Value: RARE WBC PRESENT, PREDOMINANTLY  MONONUCLEAR     NO SQUAMOUS EPITHELIAL CELLS SEEN     NO ORGANISMS SEEN   Culture NO GROWTH   Final   Report Status PENDING   Incomplete    Scharlene Gloss, [facility] [email or web address] [phone] pager  [phone] cell 09/06/2012, 1:33 PM

## 2012-09-06 NOTE — Progress Notes (Signed)
Subjective: 1 Day Post-Op Procedure(s) (LRB): RIGHT HIP RESECTION ARTHROPLASTY WITH ANTIBIOTIC SPACERS (Right) Patient reports pain as mild.   Patient seen in rounds with Dr. Wynelle Link. Patient is well, but has had some minor complaints of pain in the hip, requiring pain medications We will start therapy today.  Plan is to go Skilled nursing facility after hospital stay.  Objective: Vital signs in last 24 hours: Temp:  [97 F (36.1 C)-98.4 F (36.9 C)] 98.4 F (36.9 C) (05/29 0533) Pulse Rate:  [64-84] 64 (05/29 0533) Resp:  [12-20] 20 (05/29 0533) BP: (108-139)/(61-78) 111/70 mmHg (05/29 0533) SpO2:  [95 %-100 %] 98 % (05/29 0533) FiO2 (%):  [2 %] 2 % (05/28 2225) Weight:  [86.807 kg (191 lb 6 oz)] 86.807 kg (191 lb 6 oz) (05/28 2225)  Intake/Output from previous day:  Intake/Output Summary (Last 24 hours) at 09/06/12 0808 Last data filed at 09/06/12 0533  Gross per 24 hour  Intake   5955 ml  Output   3175 ml  Net   2780 ml    Labs:  Recent Labs  09/05/12 1430 09/05/12 2040 09/06/12 0407  HGB 11.6* 12.2* 11.5*    Recent Labs  09/05/12 1430 09/05/12 2040 09/06/12 0407  WBC 9.0  --  12.2*  RBC 4.53  --  4.41  HCT 35.8* 36.0* 35.3*  PLT 328  --  252    Recent Labs  09/05/12 1430 09/05/12 2040 09/06/12 0407  NA 139 140 137  K 3.8 4.4 4.5  CL 102  --  103  CO2 27  --  27  BUN 12  --  12  CREATININE 0.79  --  0.75  GLUCOSE 104* 196* 190*  CALCIUM 9.2  --  8.0*    Recent Labs  09/05/12 1430  INR 1.11    EXAM General - Patient is Alert, Appropriate and Oriented Extremity - Neurovascular intact Sensation intact distally Dorsiflexion/Plantar flexion intact No cellulitis present Dressing - dressing C/D/I Motor Function - intact, moving foot and toes well on exam.  Hemovac pulled without difficulty.  Past Medical History  Diagnosis Date  . Sleep apnea     cpap not available  . GERD (gastroesophageal reflux disease)     rare reflux - no  meds for reflux  . Abscess of muscle 08/10/2011    staph infection of right hip   . Hip dysplasia, congenital     no surgery as a child for hip dysplasia - has had bilateral hip replacements as an adult  . Septic arthritis of hip 09/05/2012    Assessment/Plan: 1 Day Post-Op Procedure(s) (LRB): RIGHT HIP RESECTION ARTHROPLASTY WITH ANTIBIOTIC SPACERS (Right) Principal Problem:   Septic arthritis of hip  Estimated body mass index is 30.9 kg/(m^2) as calculated from the following:   Height as of this encounter: 5\' 6"  (1.676 m).   Weight as of this encounter: 86.807 kg (191 lb 6 oz). Up with therapy Discharge to SNF  DVT Prophylaxis - Xarelto 50% Partial Weight Bearing Right Leg D/C Knee Immobilizer Hemovac Pulled Begin Therapy Hip Preacutions No vaccines.  PICC Line Placement  I. [place] with Dr. 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 Placed on IV Vanco/Rifampin. 07/28/2011 - Multiple Teeth Extraction - Staph aureus Bacteremia. 08/03/2012 - Office  Visit for Right Hip Pain - Sent for labs Elevate CRP -12.3, Sed rate - 26, minimally elevated Cobalt - 1.1, normal Chromium level <1. 08/23/2012 - Sent for MARS MRI and Hip Aspiration - Hip aspiration positive for Staphylooccus aureus, WBC elevated of 365,900. 09/06/2012 - Right Total Hip Resection for Infection - Dr. Levon Hedger, [place] 09/06/2012, 8:08 AM

## 2012-09-07 ENCOUNTER — Encounter (HOSPITAL_COMMUNITY): Payer: Self-pay | Admitting: Orthopedic Surgery

## 2012-09-07 DIAGNOSIS — D62 Acute posthemorrhagic anemia: Secondary | ICD-10-CM

## 2012-09-07 HISTORY — DX: Acute posthemorrhagic anemia: D62

## 2012-09-07 LAB — BASIC METABOLIC PANEL
BUN: 12 mg/dL (ref 6–23)
Creatinine, Ser: 0.74 mg/dL (ref 0.50–1.35)
GFR calc Af Amer: >90 mL/min (ref >90)
GFR calc non Af Amer: >90 mL/min (ref >90)
Glucose, Bld: 135 mg/dL — ABNORMAL HIGH (ref 70–99)
Potassium: 3.8 mEq/L (ref 3.5–5.1)

## 2012-09-07 LAB — CBC
HCT: 28.1 % — ABNORMAL LOW (ref 39.0–52.0)
Hemoglobin: 9 g/dL — ABNORMAL LOW (ref 13.0–17.0)
MCHC: 32.4 g/dL (ref 30.0–36.0)
MCV: 81.4 fL (ref 78.0–100.0)
RDW: 16.7 % — ABNORMAL HIGH (ref 11.5–15.5)

## 2012-09-07 MED ORDER — CEFAZOLIN SODIUM-DEXTROSE 2-3 GM-% IV SOLR: 2.0000 g | Freq: Three times a day (TID) | INTRAVENOUS | Status: DC

## 2012-09-07 MED ORDER — METHOCARBAMOL 500 MG PO TABS: 500.0000 mg | ORAL_TABLET | Freq: Four times a day (QID) | ORAL | Status: DC | PRN

## 2012-09-07 MED ORDER — RIVAROXABAN 10 MG PO TABS: 10.0000 mg | ORAL_TABLET | Freq: Every day | ORAL | Status: DC

## 2012-09-07 MED ORDER — TRAMADOL HCL 50 MG PO TABS: 50.0000 mg | ORAL_TABLET | Freq: Four times a day (QID) | ORAL | Status: DC | PRN

## 2012-09-07 MED ORDER — OXYCODONE HCL 5 MG PO TABS: 5.0000 mg | ORAL_TABLET | ORAL | Status: DC | PRN

## 2012-09-07 NOTE — Care Management Note (Unsigned)
    Page 1 of 2   09/07/2012     5:11:50 PM   CARE MANAGEMENT NOTE 09/07/2012  Patient:  Steve [name], Steve [name]   Account Number:  [id number]  Date Initiated:  09/06/2012  Documentation initiated by:  DAVIS,TYMEEKA  Subjective/Objective Assessment:   57 yo male admitted s/p RIGHT HIP RESECTION ARTHROPLASTY WITH ANTIBIOTIC SPACERS. PTA pt independent.     Action/Plan:   Home with [facility] services.   Anticipated DC Date:     Anticipated DC Plan:  [place]  CM consult      Choice offered to / List presented to:  [name]   DME arranged  Vassie Moselle      DME agency  [place] arranged  [place].   Status of service:  Completed, signed off Medicare Important Message given?   (If response is "NO", the following Medicare IM given date fields will be blank) Date Medicare IM given:   Date Additional Medicare IM given:    Discharge Disposition:    Per UR Regulation:  Reviewed for med. necessity/level of care/duration of stay  If discussed at [place] of Stay Meetings, dates discussed:    Comments:  05/30/2014l Steve [name] BSN RN CCM [phone] Pt for discharge to home with [facility] services and home IV abx. [facility] will service patient.  09/06/12 [place] [id number] Cm spoke with patient concerning discharge planning. PT recommendations for HHPT. Pt offered choice for [name]. Per pt choice [facility] to provide [facility] services at discharge. Pt to dc home with sister. [place] notified of referral. pt request RW. DMe delivery scheduled to room prior to discharge.

## 2012-09-07 NOTE — Progress Notes (Signed)
[facility] for Infectious Disease  Date of Admission:  09/05/2012  Antibiotics:  cefazolin  Subjective: No complaints, up walking, no f/c  Objective: Temp:  [98.3 F (36.8 C)-98.8 F (37.1 C)] 98.8 F (37.1 C) (05/30 1400) Pulse Rate:  [67-72] 72 (05/30 1400) Resp:  [16-20] 16 (05/30 1400) BP: (107-119)/(57-70) 115/68 mmHg (05/30 1400) SpO2:  [94 %-98 %] 95 % (05/30 1400)  General: Awake, alert, nad Skin: no rashes Lungs: CTA B Cor: RRR without m Abdomen: soft, nt, nd   Lab Results Lab Results  Component Value Date   WBC 12.0* 09/07/2012   HGB 9.0* 09/07/2012   HCT 28.1* 09/07/2012   MCV 81.4 09/07/2012   PLT 235 09/07/2012    Lab Results  Component Value Date   CREATININE 0.74 09/07/2012   BUN 12 09/07/2012   NA 137 09/07/2012   K 3.8 09/07/2012   CL 103 09/07/2012   CO2 28 09/07/2012    Lab Results  Component Value Date   ALT 10 09/05/2012   AST 13 09/05/2012   ALKPHOS 71 09/05/2012   BILITOT 0.7 09/05/2012      Microbiology: Recent Results (from the past 240 hour(s))  SURGICAL PCR SCREEN     Status: Abnormal   Collection Time    09/05/12  2:25 PM      Result Value Range Status   MRSA, PCR NEGATIVE  NEGATIVE Final   Staphylococcus aureus POSITIVE (*) NEGATIVE Final   Comment:            The Xpert SA Assay (FDA     approved for NASAL specimens     in patients over 8 years of age),     is one component of     a comprehensive surveillance     program.  Test performance has     been validated by [employer] for patients greater     than or equal to 38 year old.     It is not intended     to diagnose infection nor to     guide or monitor treatment.  ANAEROBIC CULTURE     Status: None   Collection Time    09/05/12  6:23 PM      Result Value Range Status   Specimen Description HIP RIGHT   Final   Special Requests NONE   Final   Gram Stain     Final   Value: NO WBC SEEN     NO SQUAMOUS EPITHELIAL CELLS SEEN     NO ORGANISMS SEEN   Culture      Final   Value: NO ANAEROBES ISOLATED; CULTURE IN PROGRESS FOR 5 DAYS   Report Status PENDING   Incomplete  WOUND CULTURE     Status: None   Collection Time    09/05/12  6:23 PM      Result Value Range Status   Specimen Description WOUND   Final   Special Requests HIP RIGHT   Final   Gram Stain     Final   Value: RARE WBC PRESENT, PREDOMINANTLY MONONUCLEAR     NO SQUAMOUS EPITHELIAL CELLS SEEN     NO ORGANISMS SEEN   Culture NO GROWTH 1 DAY   Final   Report Status PENDING   Incomplete    Studies/Results: Dg Pelvis Portable  09/05/2012   *RADIOLOGY REPORT*  Clinical Data: Right hip revision  PORTABLE PELVIS  Comparison: Preoperative radiographs earlier today at 14:06 p.m.  Findings: Interval surgical changes of right hip revision arthroplasty.  There is a non radiopaque acetabular spacer imbedded in radiopaque cement.  New femoral shaft component is present.  Interval development of an acute fracture through the greater trochanter.  Additionally, there is increased lucency at the lesser trochanter suggesting involvement at the fracture line.  Findings are concerning for periprosthetic fracture.  The soft tissue drain is present as are expected postoperative changes of edema and subcutaneous emphysema.  The left hip arthroplasty implant is unchanged.  IMPRESSION:  1.  Interval revision right total hip arthroplasty as above. 2.  New right periprosthetic fracture involving the lesser and greater trochanters.  These results were called by telephone on 09/05/2012 at 10:05 p.m. to the patient's nurse in the post anesthesia care unit, who verbally acknowledged these results.   Original Report Authenticated By: Jacqulynn Cadet, M.D.    Assessment/Plan: 1) PJI right hip s/p resection arthroplasty - continue ANcef for MSSA.  Treat through July 10th and reassess.   -we will arrange follow up in about 2 weeks -weekly cbc, cmp to [facility] -baseline ESR 22, CRP 1.2  Thanks for consult  Scharlene Gloss,  [facility] for Infectious Disease [place] [email or web address] [phone] pager   [phone] cell 09/07/2012, 3:48 PM

## 2012-09-07 NOTE — Progress Notes (Signed)
Physical Therapy Treatment Patient Details Name: Steve [name] MRN: [id number] DOB: 03-31-1956 Today's Date: 09/07/2012 Time: 1342-1410 PT Time Calculation (min): 28 min  PT Assessment / Plan / Recommendation Comments on Treatment Session  POD #2 R hip spacer pm session.  Pt OOB in recliner.  Assisted with amb second time today.  Pt plans to D/C to sister's home.    Follow Up Recommendations  Home health PT     Does the patient have the potential to tolerate intense rehabilitation     Barriers to Discharge        Equipment Recommendations  Rolling walker with 5" wheels    Recommendations for Other Services    Frequency 7X/week   Plan      Precautions / Restrictions Precautions Precautions: Fall;Posterior Hip Precaution Comments: pt able to state 2/3 precautions. Reviewed all with pt Restrictions Weight Bearing Restrictions: Yes RLE Weight Bearing: Partial weight bearing RLE Partial Weight Bearing Percentage or Pounds: Pt aware PWB   Pertinent Vitals/Pain C/o 6/10  pain ICE applied    Mobility  Bed Mobility Bed Mobility: Not assessed Details for Bed Mobility Assistance: Pt OOB in recliner Transfers Sit to Stand: 4: Min assist;With upper extremity assist;From chair/3-in-1;4: Min guard Stand to Sit: 4: Min assist;With upper extremity assist;To chair/3-in-1 Details for Transfer Assistance: min verbal cues for hand placement and extend R LE out in front plus increased time Ambulation/Gait Ambulation/Gait Assistance: 4: Min assist;4: Min guard Ambulation Distance (Feet): 85 Feet Assistive device: Rolling walker Ambulation/Gait Assistance Details: <25% VC's on proper walker to self placement to ensure PWB Gait Pattern: Step-to pattern;Decreased step length - right;Antalgic Gait velocity: decreased    PT Goals                                                                progressing    Visit Information  Last PT Received On: 09/07/12 Assistance Needed: +1     Subjective Data      Cognition       Balance     End of Session PT - End of Session Equipment Utilized During Treatment: Gait belt Activity Tolerance: Patient tolerated treatment well Patient left: in chair;with call bell/phone within reach   Rica Koyanagi  PTA [facility]  Acute  Rehab Pager      [phone]

## 2012-09-07 NOTE — Discharge Summary (Signed)
Physician Discharge Summary   Patient ID: ESGAR WAYMIRE MRN: [id number] DOB/AGE: 1955/09/02 57 y.o.  Admit date: 09/05/2012 Discharge date: 09/08/2012  Primary Diagnosis:  Infected right total hip arthroplasty.  Admission Diagnoses:  Past Medical History  Diagnosis Date  . Sleep apnea     cpap not available  . GERD (gastroesophageal reflux disease)     rare reflux - no meds for reflux  . Abscess of muscle 08/10/2011    staph infection of right hip   . Hip dysplasia, congenital     no surgery as a child for hip dysplasia - has had bilateral hip replacements as an adult  . Septic arthritis of hip 09/05/2012  . Postoperative anemia due to acute blood loss 09/07/2012   Discharge Diagnoses:   Principal Problem:   Septic arthritis of hip Active Problems:   Postoperative anemia due to acute blood loss  Estimated body mass index is 30.9 kg/(m^2) as calculated from the following:   Height as of this encounter: 5\' 6"  (1.676 m).   Weight as of this encounter: 86.807 kg (191 lb 6 oz).  Procedure(s) (LRB): RIGHT HIP RESECTION ARTHROPLASTY WITH ANTIBIOTIC SPACERS (Right)   Consults: ID - Dr. Linus Salmons  HPI: Tirrell is a 57 year old male, who had a total hip  arthroplasty done several years ago. Had absolutely no difficulties.  Last year, he had a dental infection, which led to bacteremia. He did  not have any symptoms of hip until recently. He develops swelling and  warmth about the hip about 3-4 weeks ago. He was seen in the office 2  weeks ago. Sent for aspiration, which showed no infection. I saw him  in the office a week later and aspirated the hip, and it grew  methicillin-sensitive Staph aureus. He presents today for resection  arthroplasty with placement of antibiotic spacer.  Laboratory Data: Admission on 09/05/2012, Discharged on 09/08/2012  Component Date Value Range Status  . aPTT 09/05/2012 41* 24 - 37 seconds Final   Comment:                                 IF BASELINE aPTT  IS ELEVATED,                          SUGGEST PATIENT RISK ASSESSMENT                          BE USED TO DETERMINE APPROPRIATE                          ANTICOAGULANT THERAPY.  . WBC 09/05/2012 9.0  4.0 - 10.5 K/uL Final  . RBC 09/05/2012 4.53  4.22 - 5.81 MIL/uL Final  . Hemoglobin 09/05/2012 11.6* 13.0 - 17.0 g/dL Final  . HCT 09/05/2012 35.8* 39.0 - 52.0 % Final  . MCV 09/05/2012 79.0  78.0 - 100.0 fL Final  . MCH 09/05/2012 25.6* 26.0 - 34.0 pg Final  . MCHC 09/05/2012 32.4  30.0 - 36.0 g/dL Final  . RDW 09/05/2012 16.1* 11.5 - 15.5 % Final  . Platelets 09/05/2012 328  150 - 400 K/uL Final  . Sodium 09/05/2012 139  135 - 145 mEq/L Final  . Potassium 09/05/2012 3.8  3.5 - 5.1 mEq/L Final  . Chloride 09/05/2012 102  96 - 112 mEq/L Final  . CO2 09/05/2012  27  19 - 32 mEq/L Final  . Glucose, Bld 09/05/2012 104* 70 - 99 mg/dL Final  . BUN 09/05/2012 12  6 - 23 mg/dL Final  . Creatinine, Ser 09/05/2012 0.79  0.50 - 1.35 mg/dL Final  . Calcium 09/05/2012 9.2  8.4 - 10.5 mg/dL Final  . Total Protein 09/05/2012 8.1  6.0 - 8.3 g/dL Final  . Albumin 09/05/2012 3.6  3.5 - 5.2 g/dL Final  . AST 09/05/2012 13  0 - 37 U/L Final  . ALT 09/05/2012 10  0 - 53 U/L Final  . Alkaline Phosphatase 09/05/2012 71  39 - 117 U/L Final  . Total Bilirubin 09/05/2012 0.7  0.3 - 1.2 mg/dL Final  . GFR calc non Af Amer 09/05/2012 >90  >90 mL/min Final  . GFR calc Af Amer 09/05/2012 >90  >90 mL/min Final   Comment:                                 The eGFR has been calculated                          using the CKD EPI equation.                          This calculation has not been                          validated in all clinical                          situations.                          eGFR's persistently                          <90 mL/min signify                          possible Chronic Kidney Disease.  Marland Kitchen Prothrombin Time 09/05/2012 14.2  11.6 - 15.2 seconds Final  . INR 09/05/2012 1.11  0.00 -  1.49 Final  . ABO/RH(D) 09/05/2012 A NEG   Final  . Antibody Screen 09/05/2012 NEG   Final  . Sample Expiration 09/05/2012 09/08/2012   Final  . Unit Number 09/05/2012 [id number]   Final  . Blood Component Type 09/05/2012 RED CELLS,LR   Final  . Unit division 09/05/2012 00   Final  . Status of Unit 09/05/2012 ISSUED,FINAL   Final  . Transfusion Status 09/05/2012 OK TO TRANSFUSE   Final  . Crossmatch Result 09/05/2012 Compatible   Final  . Unit Number 09/05/2012 [id number]   Final  . Blood Component Type 09/05/2012 RED CELLS,LR   Final  . Unit division 09/05/2012 00   Final  . Status of Unit 09/05/2012 ISSUED,FINAL   Final  . Transfusion Status 09/05/2012 OK TO TRANSFUSE   Final  . Crossmatch Result 09/05/2012 Compatible   Final  . Unit Number 09/05/2012 [id number]   Final  . Blood Component Type 09/05/2012 RBC LR PHER1   Final  . Unit division 09/05/2012 00   Final  . Status of Unit 09/05/2012 ISSUED,FINAL   Final  . Transfusion Status 09/05/2012 OK TO  TRANSFUSE   Final  . Crossmatch Result 09/05/2012 Compatible   Final  . Unit Number 09/05/2012 [id number]   Final  . Blood Component Type 09/05/2012 RBC LR PHER2   Final  . Unit division 09/05/2012 00   Final  . Status of Unit 09/05/2012 ISSUED,FINAL   Final  . Transfusion Status 09/05/2012 OK TO TRANSFUSE   Final  . Crossmatch Result 09/05/2012 Compatible   Final  . Unit Number 09/05/2012 [id number]   Final  . Blood Component Type 09/05/2012 RED CELLS,LR   Final  . Unit division 09/05/2012 00   Final  . Status of Unit 09/05/2012 REL FROM [facility]   Final  . Transfusion Status 09/05/2012 OK TO TRANSFUSE   Final  . Crossmatch Result 09/05/2012 Compatible   Final  . Unit Number 09/05/2012 [id number]   Final  . Blood Component Type 09/05/2012 RED CELLS,LR   Final  . Unit division 09/05/2012 00   Final  . Status of Unit 09/05/2012 REL FROM [facility]   Final  . Transfusion Status 09/05/2012 OK TO TRANSFUSE   Final  .  Crossmatch Result 09/05/2012 Compatible   Final  . Color, Urine 09/05/2012 YELLOW  YELLOW Final  . APPearance 09/05/2012 CLEAR  CLEAR Final  . Specific Gravity, Urine 09/05/2012 1.027  1.005 - 1.030 Final  . pH 09/05/2012 5.5  5.0 - 8.0 Final  . Glucose, UA 09/05/2012 NEGATIVE  NEGATIVE mg/dL Final  . Hgb urine dipstick 09/05/2012 NEGATIVE  NEGATIVE Final  . Bilirubin Urine 09/05/2012 NEGATIVE  NEGATIVE Final  . Ketones, ur 09/05/2012 NEGATIVE  NEGATIVE mg/dL Final  . Protein, ur 09/05/2012 NEGATIVE  NEGATIVE mg/dL Final  . Urobilinogen, UA 09/05/2012 0.2  0.0 - 1.0 mg/dL Final  . Nitrite 09/05/2012 NEGATIVE  NEGATIVE Final  . Leukocytes, UA 09/05/2012 NEGATIVE  NEGATIVE Final   MICROSCOPIC NOT DONE ON URINES WITH NEGATIVE PROTEIN, BLOOD, LEUKOCYTES, NITRITE, OR GLUCOSE <1000 mg/dL.  Marland Kitchen MRSA, PCR 09/05/2012 NEGATIVE  NEGATIVE Final  . Staphylococcus aureus 09/05/2012 POSITIVE* NEGATIVE Final   Comment:                                 The Xpert SA Assay (FDA                          approved for NASAL specimens                          in patients over 75 years of age),                          is one component of                          a comprehensive surveillance                          program.  Test performance has                          been validated by [employer] for patients greater  than or equal to 4 year old.                          It is not intended                          to diagnose infection nor to                          guide or monitor treatment.  . Glucose-Capillary 09/05/2012 97  70 - 99 mg/dL Final  . Order Confirmation 09/05/2012 ORDER PROCESSED BY BLOOD BANK   Final  . Order Confirmation 09/05/2012 ORDER PROCESSED BY BLOOD BANK   Final  . Specimen Description 09/05/2012 HIP RIGHT   Final  . Special Requests 09/05/2012 NONE   Final  . Gram Stain 09/05/2012    Final                   Value:NO WBC SEEN                          NO SQUAMOUS EPITHELIAL CELLS SEEN                         NO ORGANISMS SEEN  . Culture 09/05/2012 NO ANAEROBES ISOLATED   Final  . Report Status 09/05/2012 09/10/2012 FINAL   Final  . Specimen Description 09/05/2012 WOUND   Final  . Special Requests 09/05/2012 HIP RIGHT   Final  . Gram Stain 09/05/2012    Final                   Value:RARE WBC PRESENT, PREDOMINANTLY MONONUCLEAR                         NO SQUAMOUS EPITHELIAL CELLS SEEN                         NO ORGANISMS SEEN  . Culture 09/05/2012    Final                   Value:RARE STAPHYLOCOCCUS AUREUS                         Note: RIFAMPIN AND GENTAMICIN SHOULD NOT BE USED AS SINGLE DRUGS FOR TREATMENT OF STAPH INFECTIONS.  Marland Kitchen Report Status 09/05/2012 09/09/2012 FINAL   Final  . Organism ID, Bacteria 09/05/2012 STAPHYLOCOCCUS AUREUS   Final  . Order Confirmation 09/05/2012 ORDER PROCESSED BY BLOOD BANK   Final  . Order Confirmation 09/05/2012 ORDER PROCESSED BY BLOOD BANK   Final  . Glucose-Capillary 09/05/2012 180* 70 - 99 mg/dL Final  . WBC 09/06/2012 12.2* 4.0 - 10.5 K/uL Final  . RBC 09/06/2012 4.41  4.22 - 5.81 MIL/uL Final  . Hemoglobin 09/06/2012 11.5* 13.0 - 17.0 g/dL Final  . HCT 09/06/2012 35.3* 39.0 - 52.0 % Final  . MCV 09/06/2012 80.0  78.0 - 100.0 fL Final  . MCH 09/06/2012 26.1  26.0 - 34.0 pg Final  . MCHC 09/06/2012 32.6  30.0 - 36.0 g/dL Final  . RDW 09/06/2012 15.9* 11.5 - 15.5 % Final  . Platelets 09/06/2012 252  150 - 400 K/uL Final   Comment: DELTA CHECK NOTED  REPEATED TO VERIFY                          SPECIMEN CHECKED FOR CLOTS  . Sodium 09/06/2012 137  135 - 145 mEq/L Final  . Potassium 09/06/2012 4.5  3.5 - 5.1 mEq/L Final  . Chloride 09/06/2012 103  96 - 112 mEq/L Final  . CO2 09/06/2012 27  19 - 32 mEq/L Final  . Glucose, Bld 09/06/2012 190* 70 - 99 mg/dL Final  . BUN 09/06/2012 12  6 - 23 mg/dL Final  . Creatinine, Ser 09/06/2012 0.75  0.50 - 1.35  mg/dL Final  . Calcium 09/06/2012 8.0* 8.4 - 10.5 mg/dL Final  . GFR calc non Af Amer 09/06/2012 >90  >90 mL/min Final  . GFR calc Af Amer 09/06/2012 >90  >90 mL/min Final   Comment:                                 The eGFR has been calculated                          using the CKD EPI equation.                          This calculation has not been                          validated in all clinical                          situations.                          eGFR's persistently                          <90 mL/min signify                          possible Chronic Kidney Disease.  . Sodium 09/05/2012 140  135 - 145 mEq/L Final  . Potassium 09/05/2012 4.4  3.5 - 5.1 mEq/L Final  . Glucose, Bld 09/05/2012 196* 70 - 99 mg/dL Final  . HCT 09/05/2012 36.0* 39.0 - 52.0 % Final  . Hemoglobin 09/05/2012 12.2* 13.0 - 17.0 g/dL Final  . WBC 09/07/2012 12.0* 4.0 - 10.5 K/uL Final  . RBC 09/07/2012 3.45* 4.22 - 5.81 MIL/uL Final  . Hemoglobin 09/07/2012 9.0* 13.0 - 17.0 g/dL Final   Comment: REPEATED TO VERIFY                          DELTA CHECK NOTED  . HCT 09/07/2012 28.1* 39.0 - 52.0 % Final  . MCV 09/07/2012 81.4  78.0 - 100.0 fL Final  . MCH 09/07/2012 26.4  26.0 - 34.0 pg Final  . MCHC 09/07/2012 32.4  30.0 - 36.0 g/dL Final  . RDW 09/07/2012 16.7* 11.5 - 15.5 % Final  . Platelets 09/07/2012 235  150 - 400 K/uL Final  . Sodium 09/07/2012 137  135 - 145 mEq/L Final  . Potassium 09/07/2012 3.8  3.5 - 5.1 mEq/L Final  . Chloride 09/07/2012 103  96 - 112 mEq/L Final  .  CO2 09/07/2012 28  19 - 32 mEq/L Final  . Glucose, Bld 09/07/2012 135* 70 - 99 mg/dL Final  . BUN 09/07/2012 12  6 - 23 mg/dL Final  . Creatinine, Ser 09/07/2012 0.74  0.50 - 1.35 mg/dL Final  . Calcium 09/07/2012 8.0* 8.4 - 10.5 mg/dL Final  . GFR calc non Af Amer 09/07/2012 >90  >90 mL/min Final  . GFR calc Af Amer 09/07/2012 >90  >90 mL/min Final   Comment:                                 The eGFR has been calculated                            using the CKD EPI equation.                          This calculation has not been                          validated in all clinical                          situations.                          eGFR's persistently                          <90 mL/min signify                          possible Chronic Kidney Disease.  . CRP 09/07/2012 1.2* <0.60 mg/dL Final  . Sed Rate 09/07/2012 22* 0 - 16 mm/hr Final  . WBC 09/08/2012 10.3  4.0 - 10.5 K/uL Final  . RBC 09/08/2012 3.41* 4.22 - 5.81 MIL/uL Final  . Hemoglobin 09/08/2012 9.0* 13.0 - 17.0 g/dL Final  . HCT 09/08/2012 28.0* 39.0 - 52.0 % Final  . MCV 09/08/2012 82.1  78.0 - 100.0 fL Final  . MCH 09/08/2012 26.4  26.0 - 34.0 pg Final  . MCHC 09/08/2012 32.1  30.0 - 36.0 g/dL Final  . RDW 09/08/2012 16.6* 11.5 - 15.5 % Final  . Platelets 09/08/2012 228  150 - 400 K/uL Final  Orders Only on 08/24/2012  Component Date Value Range Status  . Color, Synovial 08/24/2012 PINK  YELLOW Final  . Appearance-Synovial 08/24/2012 TURBID* CLEAR Final  . WBC, Synovial 08/24/2012 365900* 0 - 200 cu mm Final   VERIFIED BY DILUTION 365,900 WBCS  . Neutrophil, Synovial 08/24/2012 98* 0 - 25 % Final  . Lymphocytes-Synovial Fld 08/24/2012 0  0 - 20 % Final  . Monocyte/Macrophage 08/24/2012 2* 50 - 90 % Final  . Eosinophils-Synovial 08/24/2012 0  0 - 1 % Final  . Crystals, Fluid 08/24/2012 No crystals seen   Final  Hospital Outpatient Visit on 08/24/2012  Component Date Value Range Status  . Culture 08/24/2012 Few STAPHYLOCOCCUS AUREUS   Final   STAT  . GRAM STAIN 08/24/2012 Abundant   Final  . GRAM STAIN 08/24/2012 WBC present-predominately PMN   Final  . GRAM STAIN 08/24/2012 No Organisms Seen   Final  . Organism ID, Bacteria 08/24/2012 STAPHYLOCOCCUS AUREUS  Final   Comment: Rifampin and Gentamicin should not be used as                          single drugs for treatment of Staph infections.  Marland Kitchen GRAM STAIN 08/24/2012 Abundant    Final  . GRAM STAIN 08/24/2012 WBC present-predominately PMN   Final  . GRAM STAIN 08/24/2012 No Organisms Seen   Final  . Organism ID, Bacteria 08/24/2012 NO ANAEROBES ISOLATED   Final     X-Rays:Dg Eye Foreign Body  08/26/2012   *RADIOLOGY REPORT*  Clinical Data: Pre MRI screening  ORBITS FOR FOREIGN BODY - 2 VIEW  Comparison: None.  Findings: Two views of the orbits looking right and left demonstrate no radiopaque foreign body.  Bony confines of the orbit are intact.  IMPRESSION: Negative.   Original Report Authenticated By: Rolla Flatten, M.D.   Dg Hip Complete Right  09/05/2012   *RADIOLOGY REPORT*  Clinical Data: Preop of infected total hip arthroplasty.  RIGHT HIP - COMPLETE 2+ VIEW  Comparison: None.  Findings: The patient is status post bilateral hip arthroplasties. No periprosthetic fracture.  IMPRESSION: Bilateral hip arthroplasties.  No acute findings.   Original Report Authenticated By: Lorin Picket, M.D.   Dg Pelvis Portable  09/05/2012   *RADIOLOGY REPORT*  Clinical Data: Right hip revision  PORTABLE PELVIS  Comparison: Preoperative radiographs earlier today at 14:06 p.m.  Findings: Interval surgical changes of right hip revision arthroplasty.  There is a non radiopaque acetabular spacer imbedded in radiopaque cement.  New femoral shaft component is present.  Interval development of an acute fracture through the greater trochanter.  Additionally, there is increased lucency at the lesser trochanter suggesting involvement at the fracture line.  Findings are concerning for periprosthetic fracture.  The soft tissue drain is present as are expected postoperative changes of edema and subcutaneous emphysema.  The left hip arthroplasty implant is unchanged.  IMPRESSION:  1.  Interval revision right total hip arthroplasty as above. 2.  New right periprosthetic fracture involving the lesser and greater trochanters.  These results were called by telephone on 09/05/2012 at 10:05 p.m. to the patient's  nurse in the post anesthesia care unit, who verbally acknowledged these results.   Original Report Authenticated By: Jacqulynn Cadet, M.D.   Dg Fluoro Guide Ndl Plc/bx  08/24/2012   *RADIOLOGY REPORT*  Clinical Data: Right hip pain.  History of infection.  FLUOROSCOPIC GUIDED RIGHT HIP ASPIRATION.  Comparison:  None  Fluoroscopy Time: 51 seconds  Procedure:  The procedure, risks, benefits, and alternatives were explained to the patient.  Questions regarding the procedure were encouraged and answered.  The patient understands and consents to the procedure.  The right hip was prepped with betadine in a sterile fashion, and a sterile drape was applied covering the operative field.  A sterile gown and sterile gloves were used for the procedure. Local anesthesia was provided with 1% Lidocaine.  A 20 gauge spinal needle was then directed to the neck of the right femoral prosthesis under direct fluoroscopic guidance. I manipulated the tip of the needle while applying aspiration and was able to aspirate less than 1.0 ml of purulent material.  I repositioned the needle and was unable to aspirate any additional purulent material.  I injected 20 ml of sterile saline along the neck of the prosthesis but was only able to aspirate approximately 2 ml.  Complications: None  IMPRESSION: The hip appears infected with purulent material aspirated.  Unfortunately, I was only able to aspirate less than 1 ml of purulent fluid.  This was sent for cultures as requested.   Original Report Authenticated By: San Morelle, M.D.    EKG: Orders placed during the hospital encounter of 09/05/12  . EKG 12-LEAD  . EKG 12-LEAD  . EKG     Hospital Course: Patient was admitted to [facility] and taken to the OR and underwent the above state procedure without complications.  Patient tolerated the procedure well and was later transferred to the recovery room and then to the orthopaedic floor for postoperative care.  They were  given PO and IV analgesics for pain control following their surgery.  They were given 24 hours of postoperative antibiotics of  Anti-infectives   Start     Dose/Rate Route Frequency Ordered Stop  =                                                             09/05/12 1401  ceFAZolin (ANCEF) IVPB 2 g/50 mL premix     2 g 100 mL/hr over 30 Minutes Intravenous 30 min pre-op 09/05/12 1402 09/05/12 1759     and started on DVT prophylaxis in the form of Xarelto.   PT and OT were ordered for total hip protocol.  The patient was allowed to be 50% Partial Weight Bearing Right Leg with therapy. Discharge planning was consulted to help with postop disposition and equipment needs.  Patient had a decent night on the evening of surgery.  They started to get up OOB with therapy on day one. PICC Line Placement ordered.  I. D. Consult called - Spoke with Dr. Linus Salmons.  Hemovac drain was pulled without difficulty.  Continued to work with therapy into day two.  Dressing was changed on day two and the incision was healing well.  Appreciated I. D. Consult per Dr. Linus Salmons.  Cefazolin 2 grams IV q 8 hours for minimum 6 weeks through July 10th per home health or rehab protocol  We will arrange follow up in [facility] in about 2 weeks  He will need weekly CBC, CMP while on Ancef, results to [facility] picc line in until seen by ID Patient was seen in rounds on POD 3 and was ready to go home later that same day after arrangements set up for IV ABX.  Discharge Medications: Prior to Admission medications   Medication Sig Start Date End Date Taking? Authorizing Provider  ceFAZolin (ANCEF) 2-3 GM-% SOLR Inject 50 mLs (2 g total) into the vein every 8 (eight) hours. 09/07/12   Jariel Drost, PA-C  methocarbamol (ROBAXIN) 500 MG tablet Take 1 tablet (500 mg total) by mouth every 6 (six) hours as needed. 09/07/12   Amylah Will, PA-C  oxyCODONE (OXY IR/ROXICODONE) 5 MG immediate release tablet Take 1-2 tablets (5-10 mg  total) by mouth every 3 (three) hours as needed. 09/07/12   Ruthann Angulo Dara Lords, PA-C  rivaroxaban (XARELTO) 10 MG TABS tablet Take 1 tablet (10 mg total) by mouth daily with breakfast. Take Xarelto for two and a half more weeks, then discontinue Xarelto. Once the patient has completed the Xarelto, the patient should start an 81 mg Aspirin daily. 09/07/12   Elanora Quin, PA-C  traMADol (ULTRAM) 50 MG tablet Take 1-2 tablets (50-100  mg total) by mouth every 6 (six) hours as needed (mild pain). 09/07/12   Addilynne Olheiser Dara Lords, PA-C    Diet: Regular diet Activity:PWB No bending hip over 90 degrees- A "L" Angle Do not cross legs Do not let foot roll inward When turning these patients a pillow should be placed between the patient's legs to prevent crossing. Patients should have the affected knee fully extended when trying to sit or stand from all surfaces to prevent excessive hip flexion. When ambulating and turning toward the affected side the affected leg should have the toes turned out prior to moving the walker and the rest of patient's body as to prevent internal rotation/ turning in of the leg. Abduction pillows are the most effective way to prevent a patient from not crossing legs or turning toes in at rest. If an abduction pillow is not ordered placing a regular pillow length wise between the patient's legs is also an effective reminder. It is imperative that these precautions be maintained so that the surgical hip does not dislocate. Follow-up:in 2 weeks Disposition - Home Discharged Condition: stable       Discharge Orders   Future Appointments Provider Department Dept Phone   09/20/2012 3:00 PM Thayer Headings, MD [facility] [phone]   Future Orders Complete By Expires     Call MD / Call 911  As directed     Comments:      If you experience chest pain or shortness of breath, CALL 911 and be transported to the hospital emergency room.  If  you develope a fever above 101 F, pus (white drainage) or increased drainage or redness at the wound, or calf pain, call your surgeon's office.    Change dressing  As directed     Comments:      You may change your dressing dressing daily with sterile 4 x 4 inch gauze dressing and paper tape.  Do not submerge the incision under water.    Constipation Prevention  As directed     Comments:      Drink plenty of fluids.  Prune juice may be helpful.  You may use a stool softener, such as Colace (over the counter) 100 mg twice a day.  Use MiraLax (over the counter) for constipation as needed.    Diet - low sodium heart healthy  As directed     Discharge instructions  As directed     Comments:      Pick up stool softner and laxative for home. Do not submerge incision under water. May shower. Continue to use ice for pain and swelling from surgery. Hip precautions.  Total Hip Protocol.  Take Xarelto for two and a half more weeks, then discontinue Xarelto. Once the patient has completed the Xarelto, the patient should start an 81 mg Aspirin daily.    Do not sit on low chairs, stoools or toilet seats, as it may be difficult to get up from low surfaces  As directed     Driving restrictions  As directed     Comments:      No driving until released by the physician.    Follow the hip precautions as taught in Physical Therapy  As directed     Increase activity slowly as tolerated  As directed     Lifting restrictions  As directed     Comments:      No lifting until released by the physician.    Partial weight  bearing  As directed     Scheduling Instructions:      50% partial weight bearing    Patient may shower  As directed     Comments:      You may shower without a dressing once there is no drainage.  Do not wash over the wound.  If drainage remains, do not shower until drainage stops.    TED hose  As directed     Comments:      Use stockings (TED hose) for 3 weeks on both leg(s).  You may  remove them at night for sleeping.        Medication List    STOP taking these medications       diclofenac 75 MG EC tablet  Commonly known as:  VOLTAREN     HYDROcodone-acetaminophen 7.5-325 MG per tablet  Commonly known as:  NORCO     ibuprofen 200 MG tablet  Commonly known as:  ADVIL,MOTRIN     naproxen sodium 220 MG tablet  Commonly known as:  ANAPROX      TAKE these medications       ceFAZolin 2-3 GM-% Solr  Commonly known as:  ANCEF  Inject 50 mLs (2 g total) into the vein every 8 (eight) hours.     methocarbamol 500 MG tablet  Commonly known as:  ROBAXIN  Take 1 tablet (500 mg total) by mouth every 6 (six) hours as needed.     oxyCODONE 5 MG immediate release tablet  Commonly known as:  Oxy IR/ROXICODONE  Take 1-2 tablets (5-10 mg total) by mouth every 3 (three) hours as needed.     rivaroxaban 10 MG Tabs tablet  Commonly known as:  XARELTO  Take 1 tablet (10 mg total) by mouth daily with breakfast. Take Xarelto for two and a half more weeks, then discontinue Xarelto.  Once the patient has completed the Xarelto, the patient should start an 81 mg Aspirin daily.     traMADol 50 MG tablet  Commonly known as:  ULTRAM  Take 1-2 tablets (50-100 mg total) by mouth every 6 (six) hours as needed (mild pain).       Follow-up Information   Follow up with [name], MD. Schedule an appointment as soon as possible for a visit in 2 weeks.   Contact information:   [address] [place] [address] [id number]       Signed: Mickel Crow 09/13/2012, 9:54 AM

## 2012-09-07 NOTE — Progress Notes (Signed)
[facility]   Patient is set up with [facility] for IV abx and [facility] RN. Pt will be seen on Saturday 5/31 in his home for his 2pm dose of IV cefazolin.  If the patient does not d/c before 2pm on 5/31 please notify [facility] at [phone] to let the Pharmacy know that patient will not need to be seen until his 10pm dose.  Thank you.   If patient discharges after hours, please call [phone].   Lurlean Leyden 09/07/2012, 12:23 PM

## 2012-09-07 NOTE — Progress Notes (Signed)
Physical Therapy Treatment Patient Details Name: Steve [name] MRN: [id number] DOB: [birth year] Today's Date: 09/07/2012 Time: 1016-1040 PT Time Calculation (min): 24 min  PT Assessment / Plan / Recommendation Comments on Treatment Session  POD #2 R hip spacer am session.  Pt OOB in recliner.  Assisted with amb then TE's.  Pt recalls 2/3 THP so re educated and pt aware he is PWB.  Pt plans to D/C to sister's home.    Follow Up Recommendations  Home health PT     Does the patient have the potential to tolerate intense rehabilitation     Barriers to Discharge        Equipment Recommendations  Rolling walker with 5" wheels    Recommendations for Other Services    Frequency 7X/week   Plan      Precautions / Restrictions Precautions Precautions: Fall;Posterior Hip Precaution Comments: pt able to state 2/3 precautions. Reviewed all with pt Restrictions Weight Bearing Restrictions: Yes RLE Weight Bearing: Partial weight bearing RLE Partial Weight Bearing Percentage or Pounds: Pt aware PWB   Pertinent Vitals/Pain C/o 8/10 during gait ICE applied    Mobility  Bed Mobility Bed Mobility: Not assessed Supine to Sit: 4: Min assist;HOB elevated Details for Bed Mobility Assistance: Pt OOB in recliner Transfers Sit to Stand: 4: Min assist;With upper extremity assist;From chair/3-in-1;4: Min guard Stand to Sit: 4: Min assist;With upper extremity assist;To chair/3-in-1 Details for Transfer Assistance: min verbal cues for hand placement and extend R LE out in front plus increased time Ambulation/Gait Ambulation/Gait Assistance: 4: Min assist;4: Min guard Ambulation Distance (Feet): 65 Feet Assistive device: Rolling walker Ambulation/Gait Assistance Details: <25% VC's on proper walker to self placement to ensure PWB Gait Pattern: Step-to pattern;Decreased step length - right;Antalgic Gait velocity: decreased    Exercises  HIP TE's 10 reps ankle pumps 10 reps knee presses 10 reps  heel slides 10 reps SAQ's 10 reps ABD Followed by ICE    PT Goals                                                               progressing    Visit Information  Last PT Received On: 09/07/12 Assistance Needed: +1    Subjective Data      Cognition  Cognition Arousal/Alertness: Awake/alert Behavior During Therapy: WFL for tasks assessed/performed Overall Cognitive Status: Within Functional Limits for tasks assessed    Balance  Balance Balance Assessed: Yes Dynamic Standing Balance Dynamic Standing - Level of Assistance: 4: Min assist  End of Session PT - End of Session Equipment Utilized During Treatment: Gait belt Activity Tolerance: Patient tolerated treatment well Patient left: in chair;with call bell/phone within reach   Rica Koyanagi  PTA [facility]  Acute  Rehab Pager      [phone]

## 2012-09-07 NOTE — Progress Notes (Signed)
   Subjective: 2 Days Post-Op Procedure(s) (LRB): RIGHT HIP RESECTION ARTHROPLASTY WITH ANTIBIOTIC SPACERS (Right) Patient reports pain as mild.   Patient seen in rounds with Dr. Wynelle Link.  He was able to get up yesterday and do some walking. Patient is well, but has had some minor complaints of pain in the hip, requiring pain medications Plan is to go home after hospital stay.  Arrangements being set up for IV ABX.  Objective: Vital signs in last 24 hours: Temp:  [98.3 F (36.8 C)-98.7 F (37.1 C)] 98.5 F (36.9 C) (05/30 0455) Pulse Rate:  [64-83] 71 (05/30 0455) Resp:  [16-20] 18 (05/30 0744) BP: (98-119)/(51-74) 112/70 mmHg (05/30 0455) SpO2:  [93 %-98 %] 97 % (05/30 0744)  Intake/Output from previous day:  Intake/Output Summary (Last 24 hours) at 09/07/12 0829 Last data filed at 09/07/12 0744  Gross per 24 hour  Intake 2526.67 ml  Output   3085 ml  Net -558.33 ml    Intake/Output this shift: Total I/O In: -  Out: 275 [Urine:275]  Labs:  Recent Labs  09/05/12 1430 09/05/12 2040 09/06/12 0407 09/07/12 0415  HGB 11.6* 12.2* 11.5* 9.0*    Recent Labs  09/06/12 0407 09/07/12 0415  WBC 12.2* 12.0*  RBC 4.41 3.45*  HCT 35.3* 28.1*  PLT 252 235    Recent Labs  09/06/12 0407 09/07/12 0415  NA 137 137  K 4.5 3.8  CL 103 103  CO2 27 28  BUN 12 12  CREATININE 0.75 0.74  GLUCOSE 190* 135*  CALCIUM 8.0* 8.0*    Recent Labs  09/05/12 1430  INR 1.11    EXAM General - Patient is Alert, Appropriate and Oriented Extremity - Neurovascular intact Sensation intact distally Dorsiflexion/Plantar flexion intact No cellulitis present Dressing/Incision - clean, dry, no drainage, healing Motor Function - intact, moving foot and toes well on exam.   Past Medical History  Diagnosis Date  . Sleep apnea     cpap not available  . GERD (gastroesophageal reflux disease)     rare reflux - no meds for reflux  . Abscess of muscle 08/10/2011    staph infection of  right hip   . Hip dysplasia, congenital     no surgery as a child for hip dysplasia - has had bilateral hip replacements as an adult  . Septic arthritis of hip 09/05/2012  . Postoperative anemia due to acute blood loss 09/07/2012    Assessment/Plan: 2 Days Post-Op Procedure(s) (LRB): RIGHT HIP RESECTION ARTHROPLASTY WITH ANTIBIOTIC SPACERS (Right) Principal Problem:   Septic arthritis of hip Active Problems:   Postoperative anemia due to acute blood loss  Estimated body mass index is 30.9 kg/(m^2) as calculated from the following:   Height as of this encounter: 5\' 6"  (1.676 m).   Weight as of this encounter: 86.807 kg (191 lb 6 oz). Up with therapy Plan for discharge tomorrow Discharge home with home health  DVT Prophylaxis - Xarelto 50% Partial Weight Bearing Right Leg  Appreciate I. D. Consult per Dr. Linus Salmons.  Cefazolin 2 grams IV q 8 hours for minimum 6 weeks through July 10th per home health or rehab protocol  We will arrange follow up in [facility] in about 2 weeks  He will need weekly CBC, CMP while on Ancef, results to Home picc line in until seen by ID  PERKINS, ALEXZANDREW 09/07/2012, 8:29 AM

## 2012-09-07 NOTE — Progress Notes (Addendum)
Occupational Therapy Treatment Patient Details Name: Steve [name] MRN: [id number] DOB: 08/24/1955 Today's Date: 09/07/2012 Time: [id number] OT Time Calculation (min): 29 min  OT Assessment / Plan / Recommendation Comments on Treatment Session Pt is s/p R hip resection and placement of spacer. He tolerated session better today with decreased pain. Educated on AE options and practiced 3in1 transfer/toilet hygiene/clothing manipulation.     Follow Up Recommendations  Home health OT;Supervision/Assistance - 24 hour    Barriers to Discharge       Equipment Recommendations  3 in 1 bedside comode;Tub/shower bench (tub transfer bench)    Recommendations for Other Services    Frequency Min 2X/week   Plan Discharge plan remains appropriate    Precautions / Restrictions Precautions Precautions: Fall;Posterior Hip Precaution Comments: pt able to state 2/3 precautions. Reviewed all with pt Restrictions Weight Bearing Restrictions: Yes RLE Weight Bearing: Partial weight bearing RLE Partial Weight Bearing Percentage or Pounds:  (25-50%)        ADL  Toilet Transfer: Minimal assistance Toilet Transfer Method: Other (comment) (with RW into bathroom) Toilet Transfer Equipment: Raised toilet seat with arms (or 3-in-1 over toilet) Toileting - Clothing Manipulation and Hygiene: Minimal assistance Where Assessed - Toileting Clothing Manipulation and Hygiene: Sit to stand from 3-in-1 or toilet Equipment Used: Long-handled shoe horn;Long-handled sponge;Reacher;Rolling walker;Sock aid ADL Comments: Demonstrated all AE with pt and he verbalizes understanding. States family will probably help with shoes but he would like to do the rest of LB dressing. Discussed elastic laces as an option also. Pt needed some cues during session to adhere to THPs. He would like a tub transfer bench for d/c .     OT Diagnosis:    OT Problem List:   OT Treatment Interventions:     OT Goals Acute Rehab OT Goals OT  Goal Formulation: With patient Time For Goal Achievement: 09/13/12 Potential to Achieve Goals: Good ADL Goals Pt Will Perform Grooming: with supervision;Standing at sink Pt Will Perform Lower Body Bathing: with supervision;Sit to stand from bed;Sit to stand from chair;with adaptive equipment Pt Will Perform Lower Body Dressing: with supervision;with adaptive equipment;Sit to stand from bed;Sit to stand from chair Pt Will Transfer to Toilet: with supervision;3-in-1;with DME;Ambulation ADL Goal: Toilet Transfer - Progress: Progressing toward goals Pt Will Perform Toileting - Clothing Manipulation: with supervision;Standing ADL Goal: Toileting - Clothing Manipulation - Progress: Progressing toward goals  Visit Information  Last OT Received On: 09/07/12 Assistance Needed: +1    Subjective Data  Subjective: I am doing better today Patient Stated Goal: do as much as he can for himself   Prior Functioning       Cognition  Cognition Arousal/Alertness: Awake/alert Behavior During Therapy: WFL for tasks assessed/performed Overall Cognitive Status: Within Functional Limits for tasks assessed    Mobility  Bed Mobility Bed Mobility: Supine to Sit Supine to Sit: 4: Min assist;HOB elevated Details for Bed Mobility Assistance: pt used sheet to guide R LE over to EOB but still needed some assist. Transfers Transfers: Sit to Stand;Stand to Sit Sit to Stand: 4: Min assist;With upper extremity assist;From bed;From chair/3-in-1;4: Min guard (min assist from bed, min guard assist from 3in1) Stand to Sit: 4: Min assist;With upper extremity assist;To chair/3-in-1 Details for Transfer Assistance: min verbal cues for hand placement and extend R LE out in front    Exercises      Balance Balance Balance Assessed: Yes Dynamic Standing Balance Dynamic Standing - Level of Assistance: 4: Min assist   End  of Session OT - End of Session Equipment Utilized During Treatment: Gait belt Activity  Tolerance: Patient tolerated treatment well Patient left: in chair;with call bell/phone within reach  GO     Jules Schick [id number] 09/07/2012, 9:49 AM

## 2012-09-08 LAB — CBC
Hemoglobin: 9 g/dL — ABNORMAL LOW (ref 13.0–17.0)
MCH: 26.4 pg (ref 26.0–34.0)
RBC: 3.41 MIL/uL — ABNORMAL LOW (ref 4.22–5.81)
WBC: 10.3 10*3/uL (ref 4.0–10.5)

## 2012-09-08 MED ORDER — SODIUM CHLORIDE 0.9 % IJ SOLN: 10.0000 mL | Freq: Two times a day (BID) | INTRAMUSCULAR | Status: DC

## 2012-09-08 MED ORDER — SODIUM CHLORIDE 0.9 % IJ SOLN
10.0000 mL | INTRAMUSCULAR | Status: DC | PRN
  Administered 2012-09-08: 10 mL

## 2012-09-08 NOTE — Progress Notes (Signed)
Physical Therapy Treatment Patient Details Name: Steve [name] MRN: [id number] DOB: [birth year] Today's Date: 09/08/2012 Time: [id number] PT Time Calculation (min): 32 min  PT Assessment / Plan / Recommendation Comments on Treatment Session  Pt had severe pain when R leg torqued when Leg stuck to pillow and julted . Appeared to be OK once leg calmed down. Noted  spasm of R leg when this happened.    Follow Up Recommendations  Home health PT     Does the patient have the potential to tolerate intense rehabilitation     Barriers to Discharge        Equipment Recommendations  Rolling walker with 5" wheels    Recommendations for Other Services    Frequency 7X/week   Plan Discharge plan remains appropriate    Precautions / Restrictions Precautions Precautions: Fall Precaution Comments: Care for moving R leg. Restrictions Weight Bearing Restrictions: Yes RLE Weight Bearing: Partial weight bearing RLE Partial Weight Bearing Percentage or Pounds: Pt aware PWB (25-50%)   Pertinent Vitals/Pain 8 down to 5-6 .    Mobility  Bed Mobility Supine to Sit: 3: Mod assist Details for Bed Mobility Assistance: used sheet to move Leg to edge with min assist. Leg lifter did not help; Transfers Sit to Stand: 4: Min guard;From bed;With upper extremity assist;From elevated surface Stand to Sit: To chair/3-in-1;With upper extremity assist;4: Min guard Details for Transfer Assistance: min verbal cues for hand placement and extend R LE out in front plus increased time Ambulation/Gait Ambulation/Gait Assistance: 4: Min guard Ambulation Distance (Feet): 50 Feet Assistive device: Rolling walker Ambulation/Gait Assistance Details: cues for PWB Gait Pattern: Step-to pattern;Decreased step length - right;Antalgic Gait velocity: decreased Stairs: Yes Stairs Assistance: 4: Min assist Stairs Assistance Details (indicate cue type and reason): cues fro sequence. Stair Management Technique: No  rails;Step to pattern;Backwards;With walker Number of Stairs: 2    Exercises     PT Diagnosis:    PT Problem List:   PT Treatment Interventions:     PT Goals Acute Rehab PT Goals Pt will go Supine/Side to Sit: with modified independence PT Goal: Supine/Side to Sit - Progress: Progressing toward goal Pt will go Sit to Stand: with modified independence PT Goal: Sit to Stand - Progress: Progressing toward goal Pt will go Stand to Sit: with modified independence PT Goal: Stand to Sit - Progress: Progressing toward goal Pt will Ambulate: 51 - 150 feet;with supervision;with least restrictive assistive device PT Goal: Ambulate - Progress: Progressing toward goal Pt will Go Up / Down Stairs: 3-5 stairs;with min assist;with least restrictive assistive device PT Goal: Up/Down Stairs - Progress: Progressing toward goal PT Goal: Perform Home Exercise Program - Progress: Discontinued (comment)  Visit Information  Last PT Received On: 09/08/12 Assistance Needed: +1    Subjective Data  Subjective: It just twisted and that really hurt.   Cognition  Cognition Arousal/Alertness: Awake/alert    Balance     End of Session PT - End of Session Equipment Utilized During Treatment: Gait belt Activity Tolerance: Patient tolerated treatment well Nurse Communication: Mobility status   GP     Claretha Cooper 09/08/2012, 11:42 AM

## 2012-09-08 NOTE — Care Management Note (Addendum)
MD order placed for RW, 3N1, and tub bench. DME delivery to room prior to discharge. No other needs identified.   Venita Lick Robbin Escher,RN,BSN [phone]

## 2012-09-08 NOTE — Progress Notes (Signed)
Occupational Therapy Treatment Patient Details Name: Steve [name] MRN: [id number] DOB: [birth year] Today's Date: 09/08/2012 Time: [id number] OT Time Calculation (min): 22 min  OT Assessment / Plan / Recommendation Comments on Treatment Session This 57 yo male s/p R hip resection and placement of space presents to acute OT with education completed for tub transfer with tub bench. No further acute OT needs, will sign off.    Follow Up Recommendations  Home health OT;Supervision/Assistance - 24 hour       Equipment Recommendations  3 in 1 bedside comode;Tub/shower bench       Frequency Min 2X/week   Plan Discharge plan remains appropriate    Precautions / Restrictions Precautions Precautions: Fall Precaution Comments: Care for moving R leg. Restrictions Weight Bearing Restrictions: Yes RLE Weight Bearing: Partial weight bearing RLE Partial Weight Bearing Percentage or Pounds: Pt aware PWB   Pertinent Vitals/Pain Pain, but did not rate    ADL  Tub/Shower Transfer: Performed;Minimal assistance (for leg  and VCs for sequence) Tub/Shower Transfer Method: Therapist, art: IT consultant Used: Rolling walker;Gait belt ADL Comments: Educated pt on tub bench transfer due to this was the note left for me in the OT log and pt said he did need to do this; however no goal was written for this on eval. Pt appreciative of this education.        Visit Information  Last OT Received On: 09/08/12 Assistance Needed: +1 PT/OT Co-Evaluation/Treatment: Yes (partial)          Cognition  Cognition Arousal/Alertness: Awake/alert Behavior During Therapy: WFL for tasks assessed/performed Overall Cognitive Status: Within Functional Limits for tasks assessed    Mobility  Bed Mobility Supine to Sit: 3: Mod assist Details for Bed Mobility Assistance: used sheet to move Leg to edge with min assist. Leg lifter did not help; Transfers Sit to Stand: 4: Min  guard;From bed;With upper extremity assist;From elevated surface Stand to Sit: To chair/3-in-1;With upper extremity assist;4: Min guard Details for Transfer Assistance: min verbal cues for hand placement and extend R LE out in front plus increased time          End of Session OT - End of Session Equipment Utilized During Treatment: Gait belt Activity Tolerance: Patient limited by pain Patient left: in chair;with call bell/phone within reach       Almon Register [id number] 09/08/2012, 3:34 PM

## 2012-09-08 NOTE — Progress Notes (Signed)
Subjective: 3 Days Post-Op Procedure(s) (LRB): RIGHT HIP RESECTION ARTHROPLASTY WITH ANTIBIOTIC SPACERS (Right) Patient reports pain as mild.  No c/o.  No n/v/f/c.  Objective: Vital signs in last 24 hours: Temp:  [98.6 F (37 C)-99.8 F (37.7 C)] 98.6 F (37 C) (05/31 0540) Pulse Rate:  [70-79] 72 (05/31 0540) Resp:  [15-18] 18 (05/31 0540) BP: (104-121)/(61-68) 104/67 mmHg (05/31 0540) SpO2:  [92 %-98 %] 92 % (05/31 0540)  Intake/Output from previous day: 05/30 0701 - 05/31 0700 In: 1796.3 [P.O.:1260; I.V.:486.3; IV Piggyback:50] Out: 2500 [Urine:2500] Intake/Output this shift: Total I/O In: -  Out: 300 [Urine:300]   Recent Labs  09/05/12 1430 09/05/12 2040 09/06/12 0407 09/07/12 0415 09/08/12 0500  HGB 11.6* 12.2* 11.5* 9.0* 9.0*    Recent Labs  09/07/12 0415 09/08/12 0500  WBC 12.0* 10.3  RBC 3.45* 3.41*  HCT 28.1* 28.0*  PLT 235 228    Recent Labs  09/06/12 0407 09/07/12 0415  NA 137 137  K 4.5 3.8  CL 103 103  CO2 27 28  BUN 12 12  CREATININE 0.75 0.74  GLUCOSE 190* 135*  CALCIUM 8.0* 8.0*    Recent Labs  09/05/12 1430  INR 1.11    PE:  R hip dressed and dry.  NVI at [place].  Assessment/Plan: 3 Days Post-Op Procedure(s) (LRB): RIGHT HIP RESECTION ARTHROPLASTY WITH ANTIBIOTIC SPACERS (Right) Discharge home with home health  Wylene Simmer 09/08/2012, 9:25 AM

## 2012-09-08 NOTE — Progress Notes (Signed)
Discharged from floor via w/c, family with pt. No changes in assessment. Steve [name]  

## 2012-09-09 LAB — TYPE AND SCREEN
ABO/RH(D): A NEG
Antibody Screen: NEGATIVE
Unit division: 0
Unit division: 0
Unit division: 0

## 2012-09-09 LAB — WOUND CULTURE

## 2012-09-10 LAB — ANAEROBIC CULTURE: Gram Stain: NONE SEEN

## 2012-09-10 NOTE — Progress Notes (Signed)
Utilization review completed.  

## 2012-09-20 ENCOUNTER — Encounter: Payer: Self-pay | Admitting: Internal Medicine

## 2012-09-20 ENCOUNTER — Ambulatory Visit (INDEPENDENT_AMBULATORY_CARE_PROVIDER_SITE_OTHER): Payer: BC Managed Care – PPO | Admitting: Internal Medicine

## 2012-09-20 VITALS — BP 115/70 | HR 67 | Temp 98.1°F | Ht 66.0 in | Wt 187.0 lb

## 2012-09-20 DIAGNOSIS — T8450XD Infection and inflammatory reaction due to unspecified internal joint prosthesis, subsequent encounter: Secondary | ICD-10-CM

## 2012-09-20 DIAGNOSIS — Z5189 Encounter for other specified aftercare: Secondary | ICD-10-CM

## 2012-09-20 DIAGNOSIS — T8450XA Infection and inflammatory reaction due to unspecified internal joint prosthesis, initial encounter: Secondary | ICD-10-CM | POA: Insufficient documentation

## 2012-09-20 NOTE — Progress Notes (Signed)
  Subjective:    Patient ID: Steve [name], male    DOB: 03-22-1956, 57 y.o.   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 Last year, he developed fever and noted to have abscess in right hip area with MSSA bacteremia. He completed 6 weeks of IV Ancef and rifampin successfully and was doing well until about 2 weeks ago. He had progressive symptoms and aspiration revealed MSSA infection again. He underwent resection arthroplasty with spacer placement. He is now here 2 weeks into his projected 6 week course with IV Ancef. He is also on Xarelto so no rifampin given and he did have all components removed.  He says his in doing well with no fever, no chills. PICC line is clean and dry with no discharge. It is working well post blood draw and infusion. No diarrhea, abdominal pain or distention.    Review of Systems  Constitutional: Negative for fever, chills and fatigue.  Cardiovascular: Negative for leg swelling.  Gastrointestinal: Negative for nausea, abdominal pain and diarrhea.  Musculoskeletal: Positive for arthralgias. Negative for back pain and joint swelling.  Skin: Negative for rash.  Neurological: Negative for dizziness and headaches.  Hematological: Negative for adenopathy.       Objective:   Physical Exam  Constitutional: He appears well-developed and well-nourished. No distress.  Cardiovascular: Normal rate, regular rhythm and normal heart sounds.   No murmur heard. Pulmonary/Chest: Effort normal and breath sounds normal. No respiratory distress. He has no wheezes.  Abdominal: Soft. Bowel sounds are normal. He exhibits no distension.  Skin:  Take with minimal erythema at insertion site otherwise no discharge and clean bandage          Assessment & Plan:

## 2012-09-20 NOTE — Assessment & Plan Note (Signed)
He is now 2 weeks and to project his six-week course. He is doing well with no side effects or problems with the treatment. I will check CRP and sedimentation rate at the end of his treatment which will be in July 10. I will see him the week before that.if everything is going well he can have his PICC line removed on July 10 and treatment completed.

## 2012-10-08 ENCOUNTER — Encounter: Payer: Self-pay | Admitting: Internal Medicine

## 2012-10-11 ENCOUNTER — Ambulatory Visit (INDEPENDENT_AMBULATORY_CARE_PROVIDER_SITE_OTHER): Payer: BC Managed Care – PPO | Admitting: Internal Medicine

## 2012-10-11 ENCOUNTER — Encounter: Payer: Self-pay | Admitting: Internal Medicine

## 2012-10-11 VITALS — BP 146/79 | HR 62 | Temp 97.8°F | Ht 66.0 in | Wt 191.0 lb

## 2012-10-11 DIAGNOSIS — Z5189 Encounter for other specified aftercare: Secondary | ICD-10-CM

## 2012-10-11 DIAGNOSIS — T8450XD Infection and inflammatory reaction due to unspecified internal joint prosthesis, subsequent encounter: Secondary | ICD-10-CM

## 2012-10-11 NOTE — Progress Notes (Signed)
  Subjective:    Patient ID: Steve [name], male    DOB: 06-Aug-1955, 57 y.o.   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 Last year, he developed fever and noted to have abscess in right hip area with MSSA bacteremia. He completed 6 weeks of IV Ancef and rifampin successfully and was doing well until about 2 weeks ago. He had progressive symptoms and aspiration revealed MSSA infection again. He underwent resection arthroplasty with spacer placement. He is now here 5 weeks into his projected 6 week course with IV Ancef. He was also on Xarelto so no rifampin given and he did have all components removed, Xarelto now stopped. He says his in doing well with no fever, no chills. PICC line is clean and dry with no discharge. It is working well post blood draw and infusion. No diarrhea, abdominal pain or distention.    Review of Systems  Constitutional: Negative for fever, chills and fatigue.  Respiratory: Negative for shortness of breath.   Gastrointestinal: Negative for nausea, abdominal pain and diarrhea.  Skin: Negative for rash.  Neurological: Negative for dizziness, light-headedness and headaches.  Hematological: Negative for adenopathy.       Objective:   Physical Exam  Constitutional: He appears well-developed and well-nourished. No distress.  Cardiovascular: Normal rate, regular rhythm and normal heart sounds.   No murmur heard. Pulmonary/Chest: Effort normal and breath sounds normal. No respiratory distress. He has no wheezes.  Lymphadenopathy:    He has no cervical adenopathy.  Skin:  Minimal erythema around the insertion of the PICC but otherwise no discharge and clean and dry  Psychiatric: He has a normal mood and affect. His behavior is normal.          Assessment & Plan:

## 2012-10-11 NOTE — Assessment & Plan Note (Signed)
He is doing well and things seem to be going smoothly. He has almost completed his 6 week course which would be on July 10. I'm going to have his home health agency check his sedimentation rate and CRP with his next lab draw which is next Monday and if those are stable, he will have his course completed on July 10 and then have replacement done by Dr. Maureen Ralphs at his discretion.

## 2012-10-19 ENCOUNTER — Telehealth: Payer: Self-pay | Admitting: *Deleted

## 2012-10-19 NOTE — Telephone Encounter (Signed)
Verbal order per Dr. Baxter Flattery given to [facility] [place] to pull patient's picc line. Myrtis Hopping

## 2012-10-30 ENCOUNTER — Other Ambulatory Visit: Payer: Self-pay | Admitting: Orthopedic Surgery

## 2012-10-30 MED ORDER — DEXAMETHASONE SODIUM PHOSPHATE 10 MG/ML IJ SOLN: 10.0000 mg | Freq: Once | INTRAMUSCULAR | Status: DC

## 2012-10-30 MED ORDER — BUPIVACAINE LIPOSOME 1.3 % IJ SUSP: 20.0000 mL | Freq: Once | INTRAMUSCULAR | Status: DC

## 2012-10-30 NOTE — Progress Notes (Signed)
Preoperative surgical orders have been place into the Epic hospital system for Steve [name] on 10/30/2012, 6:47 PM  by Mickel Crow for surgery on 11/14/2012.  Preop Total Hip orders including Experel Injecion, IV Tylenol, and IV Decadron as long as there are no contraindications to the above medications. Arlee Muslim, PA-C

## 2012-11-02 ENCOUNTER — Encounter (HOSPITAL_COMMUNITY): Payer: Self-pay | Admitting: Pharmacy Technician

## 2012-11-06 ENCOUNTER — Encounter: Payer: Self-pay | Admitting: Internal Medicine

## 2012-11-08 ENCOUNTER — Ambulatory Visit (HOSPITAL_COMMUNITY)
Admission: RE | Admit: 2012-11-08 | Discharge: 2012-11-08 | Disposition: A | Discharge status: Home / Self Care | Payer: BC Managed Care – PPO | Source: Ambulatory Visit | Attending: Orthopedic Surgery | Admitting: Orthopedic Surgery

## 2012-11-08 ENCOUNTER — Encounter (HOSPITAL_COMMUNITY)
Admission: RE | Admit: 2012-11-08 | Discharge: 2012-11-08 | Disposition: A | Discharge status: Home / Self Care | Payer: BC Managed Care – PPO | Source: Ambulatory Visit | Attending: Orthopedic Surgery | Admitting: Orthopedic Surgery

## 2012-11-08 ENCOUNTER — Encounter (HOSPITAL_COMMUNITY): Payer: Self-pay

## 2012-11-08 DIAGNOSIS — IMO0002 Reserved for concepts with insufficient information to code with codable children: Secondary | ICD-10-CM | POA: Insufficient documentation

## 2012-11-08 LAB — CBC
Hemoglobin: 13.3 g/dL (ref 13.0–17.0)
RBC: 4.85 MIL/uL (ref 4.22–5.81)

## 2012-11-08 LAB — COMPREHENSIVE METABOLIC PANEL
ALT: 12 U/L (ref 0–53)
Alkaline Phosphatase: 92 U/L (ref 39–117)
CO2: 30 mEq/L (ref 19–32)
Chloride: 103 mEq/L (ref 96–112)
GFR calc Af Amer: >90 mL/min (ref >90)
GFR calc non Af Amer: >90 mL/min (ref >90)
Glucose, Bld: 87 mg/dL (ref 70–99)
Potassium: 4.3 mEq/L (ref 3.5–5.1)
Sodium: 141 mEq/L (ref 135–145)

## 2012-11-08 LAB — URINALYSIS, ROUTINE W REFLEX MICROSCOPIC
Glucose, UA: NEGATIVE mg/dL
Hgb urine dipstick: NEGATIVE
Ketones, ur: NEGATIVE mg/dL
Protein, ur: NEGATIVE mg/dL

## 2012-11-08 LAB — APTT: aPTT: 32 seconds (ref 24–37)

## 2012-11-08 LAB — SURGICAL PCR SCREEN: Staphylococcus aureus: POSITIVE — AB

## 2012-11-08 NOTE — Patient Instructions (Addendum)
YOUR SURGERY IS SCHEDULED AT [facility]  ON:  Wednesday  8/6   REPORT TO [place] [facility] AT:  6:30 AM      PHONE # FOR SHORT STAY IS [phone]  DO NOT EAT OR DRINK ANYTHING AFTER MIDNIGHT THE NIGHT BEFORE YOUR SURGERY.  YOU MAY BRUSH YOUR TEETH, RINSE OUT YOUR MOUTH--BUT NO WATER, NO FOOD, NO CHEWING GUM, NO MINTS, NO CANDIES, NO CHEWING TOBACCO.  PLEASE TAKE THE FOLLOWING MEDICATIONS THE AM OF YOUR SURGERY WITH A FEW SIPS OF WATER:  MAY TAKE OXYCODONE IF NEEDED FOR PAIN    IF YOU HAVE SLEEP APNEA AND USE CPAP OR BIPAP--PLEASE BRING THE MASK AND THE TUBING.  DO NOT BRING YOUR MACHINE.  DO NOT BRING VALUABLES, MONEY, CREDIT CARDS.  DO NOT WEAR JEWELRY, MAKE-UP, NAIL POLISH AND NO METAL PINS OR CLIPS IN YOUR HAIR. CONTACT LENS, DENTURES / PARTIALS, GLASSES SHOULD NOT BE WORN TO SURGERY AND IN MOST CASES-HEARING AIDS WILL NEED TO BE REMOVED.  BRING YOUR GLASSES CASE, ANY EQUIPMENT NEEDED FOR YOUR CONTACT LENS. FOR PATIENTS ADMITTED TO THE HOSPITAL--CHECK OUT TIME THE DAY OF DISCHARGE IS 11:00 AM.  ALL INPATIENT ROOMS ARE PRIVATE - WITH BATHROOM, TELEPHONE, TELEVISION AND WIFI INTERNET.                                PLEASE READ OVER ANY  FACT SHEETS THAT YOU WERE GIVEN: MRSA INFORMATION, BLOOD TRANSFUSION INFORMATION, INCENTIVE SPIROMETER INFORMATION. FAILURE TO FOLLOW THESE INSTRUCTIONS MAY RESULT IN THE CANCELLATION OF YOUR SURGERY.   PATIENT SIGNATURE_________________________________

## 2012-11-08 NOTE — Pre-Procedure Instructions (Signed)
PT HAS EKG REPORT IN EPIC FROM 09/05/12. CXR NOT NEEDED PREOP PER ANESTHESIOLOGIST'S GUIDELINES.

## 2012-11-08 NOTE — Pre-Procedure Instructions (Signed)
PT'S PREOP RIGHT HIP XRAY REPORT FAXED TO DR. ALUISIO'S OFFICE.

## 2012-11-14 ENCOUNTER — Encounter (HOSPITAL_COMMUNITY): Admission: RE | Payer: Self-pay | Source: Ambulatory Visit

## 2012-11-14 ENCOUNTER — Ambulatory Visit (HOSPITAL_COMMUNITY): Admission: RE | Admit: 2012-11-14 | Payer: Self-pay | Source: Ambulatory Visit | Admitting: Orthopedic Surgery

## 2012-11-14 SURGERY — TOTAL HIP REVISION
Anesthesia: Choice | Site: Hip | Laterality: Right

## 2012-11-15 ENCOUNTER — Encounter (HOSPITAL_COMMUNITY): Payer: Self-pay | Admitting: *Deleted

## 2012-11-15 NOTE — Progress Notes (Signed)
PT WAS SCHEDULED FOR RT TOTAL HIP RE-IMPLANT ON 11/14/12 - HAD PREOP VISIT ON 11/08/12 - THEN SURGERY WAS CANCELLED BECAUSE OF INSURANCE ISSUE.  PT NOW BACK ON FOR SURGERY 11/30/12. PREOP INSTRUCTIONS FOR NEW SURGERY DATE REVIEWED WITH PT BY PHONE - HE STILL HAS CHLORHEXIDINE SOAP TO SHOWER WITH - REMEMBERS PRECAUTIONS.  STATES HE COMPLETED HIS 5 DAYS OF MUPIROCIN OINTMENT - HIS PCR WAS POSITIVE FOR [facility] ON 7/31.

## 2012-11-17 ENCOUNTER — Other Ambulatory Visit: Payer: Self-pay | Admitting: Orthopedic Surgery

## 2012-11-21 ENCOUNTER — Other Ambulatory Visit: Payer: Self-pay | Admitting: Orthopedic Surgery

## 2012-11-21 NOTE — H&P (Signed)
Steve [name]  DOB: 03/20/1956 Married / Language: Cleophus Molt / Race: White Male  Date of Admission:  11/30/2012  Chief Complaint:  Infected Right Total Hip  History of Present Illness The patient is a 57 year old male who comes in for a preoperative History and Physical. The patient is scheduled for a right total hip reimplantation to be performed by Dr. Dione Plover. Aluisio, MD at [facility] on 11/30/2012. The patient is a 57 year old male presenting for a post-operative visit. The patient comes in status post from right hip resection arthroplasty with antibiotic spacer. The patient states that he is getting better at this time. The pain is under fair control at this time and describes their pain as mild to moderate. They are currently on Oxycodone for their pain. The patient is currently taking 4 pill(s) (max per day) per day. The patient feels that they are progressing well at this time. Steve [name] is feeling better at this time, less pain in the hip. He is not having any swelling. No fevers or chills or infectious symptoms. He is now ready for the reimplantation procedure. They have been treated conservatively in the past for the above stated problem and despite conservative measures, they continue to have progressive pain and severe functional limitations and dysfunction. They have underwent PICC line antibiotic management. It is felt that they would benefit from undergoing total joint reimplantation. Risks and benefits of the procedure have been discussed with the patient and they elect to proceed with surgery. There are no active contraindications to surgery such as ongoing infection or rapidly progressive neurological disease.   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  08/23/2012   Family History Heart disease in male family member before age 72 Cancer. mother Chronic Obstructive Lung Disease. mother and father Drug / Alcohol Addiction. mother and father   Social History Tobacco use. never smoker Number of flights of stairs before winded. 4-5 Marital status. married Tobacco / smoke exposure. yes Pain Contract. no Living situation. live with spouse Drug/Alcohol Rehab (Previously). no Exercise. Exercises rarely; does other Illicit drug use. no Drug/Alcohol Rehab (Currently). no Alcohol use. current drinker; drinks hard liquor; only occasionally per week Children. 1 Current work status. working full time   Medication History OxyCODONE HCl (5MG  Tablet, 1-2 Tablet Oral po bid, Taken starting 10/25/2012) Active. Robaxin (500MG  Tablet, Oral) Active. Aspirin EC (81MG  Tablet DR, Oral) Active.   Past Surgical History Rotator Cuff Repair. right Total Hip Replacement. bilateral: Right Hip - 2002, Left Hip - 2007 Resection Arthroplasty Right Hip   Medical History Diverticulitis Of Colon Gastroesophageal Reflux Disease Sleep Apnea. 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 10/07/1998 Sprain/strain, ankle NOS (845.00). 10/04/2000 Osteoarthrosis NOS, pelvis/thigh (715.95). 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 12/29/2000  Review of Systems General:Not Present- Chills, Fever, Night Sweats, Fatigue, Weight Gain, Weight Loss and Memory Loss. Skin:Not Present- Hives, Itching, Rash, Eczema and Lesions. HEENT:Not Present- Tinnitus, Headache, Double Vision, Visual Loss, Hearing Loss and Dentures. Respiratory:Not Present- Shortness of breath with exertion, Shortness of breath at rest, Allergies, Coughing up blood and Chronic Cough. Cardiovascular:Not  Present- Chest Pain, Racing/skipping heartbeats, Difficulty Breathing Lying Down, Murmur, Swelling and Palpitations. Gastrointestinal:Not Present- Bloody Stool, Heartburn, Abdominal Pain, Vomiting, Nausea, Constipation, Diarrhea, Difficulty  Swallowing, Jaundice and Loss of appetitie. Male Genitourinary:Not Present- Urinary frequency, Blood in Urine, Weak urinary stream, Discharge, Flank Pain, Incontinence, Painful Urination, Urgency, Urinary Retention and Urinating at Night. Musculoskeletal:Present- Back Pain and Morning Stiffness. Not Present- Muscle Weakness, Muscle Pain, Joint Swelling, Joint Pain and Spasms. Neurological:Not Present- Tremor, Dizziness, Blackout spells, Paralysis, Difficulty with balance and Weakness. Psychiatric:Not Present- Insomnia.   Vitals Weight: 190 lb Height: 66 in Weight was reported by patient. Height was reported by patient. Body Surface Area: 2 m Body Mass Index: 30.67 kg/m Pulse: 64 (Regular) Resp.: 14 (Unlabored) BP: 122/82 (Sitting, Left Arm, Standard)    Physical Exam The physical exam findings are as follows:   General Mental Status - Alert, cooperative and good historian. General Appearance- pleasant. Not in acute distress. Orientation- Oriented X3. Build & Nutrition- Well nourished and Well developed.   Head and Neck Head- normocephalic, atraumatic . Neck Global Assessment- supple. no bruit auscultated on the right and no bruit auscultated on the left.   Eye Pupil- Bilateral- Regular and Round. Motion- Bilateral- EOMI.   Chest and Lung Exam Auscultation: Breath sounds:- clear at anterior chest wall and - clear at posterior chest wall. Adventitious sounds:- No Adventitious sounds.   Cardiovascular Auscultation:Rhythm- Regular rate and rhythm. Heart Sounds- S1 WNL and S2 WNL. Murmurs & Other Heart Sounds:Auscultation of the heart reveals - No  Murmurs.   Abdomen Palpation/Percussion:Tenderness- Abdomen is non-tender to palpation. Rigidity (guarding)- Abdomen is soft. Auscultation:Auscultation of the abdomen reveals - Bowel sounds normal.   Male Genitourinary Not done, not pertinent to present illness  Musculoskeletal Well-developed male in no distress. On examination, there is no significant swelling about the thigh. He is nontender around the incision. Felxion about 100 degrees, rotate 20 degrees in each direction and abduct 30 degrees without discomfort on range of motion.  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 Wynelle Link.  Plan is to go to home with his sister.  The patient does not have any contraindications and will recieve TXA (tranexamic acid) prior to surgery.  Signed electronically by Joelene Millin, III PA-C

## 2012-11-30 ENCOUNTER — Inpatient Hospital Stay (HOSPITAL_COMMUNITY)
Admission: RE | Admit: 2012-11-30 | Discharge: 2012-12-02 | DRG: 817 | Disposition: A | Discharge status: Home Health | Payer: BC Managed Care – PPO | Source: Ambulatory Visit | Attending: Orthopedic Surgery | Admitting: Orthopedic Surgery

## 2012-11-30 ENCOUNTER — Encounter (HOSPITAL_COMMUNITY): Payer: Self-pay | Admitting: *Deleted

## 2012-11-30 ENCOUNTER — Ambulatory Visit (HOSPITAL_COMMUNITY): Payer: BC Managed Care – PPO | Admitting: Certified Registered"

## 2012-11-30 ENCOUNTER — Encounter (HOSPITAL_COMMUNITY): Payer: Self-pay | Admitting: Certified Registered"

## 2012-11-30 ENCOUNTER — Encounter (HOSPITAL_COMMUNITY)
Admission: RE | Disposition: A | Discharge status: Home Health | Payer: Self-pay | Source: Ambulatory Visit | Attending: Orthopedic Surgery

## 2012-11-30 ENCOUNTER — Inpatient Hospital Stay (HOSPITAL_COMMUNITY): Payer: BC Managed Care – PPO

## 2012-11-30 DIAGNOSIS — Z4789 Encounter for other orthopedic aftercare: Principal | ICD-10-CM

## 2012-11-30 DIAGNOSIS — Z79899 Other long term (current) drug therapy: Secondary | ICD-10-CM

## 2012-11-30 DIAGNOSIS — T8450XD Infection and inflammatory reaction due to unspecified internal joint prosthesis, subsequent encounter: Secondary | ICD-10-CM

## 2012-11-30 DIAGNOSIS — Z89629 Acquired absence of unspecified hip joint: Secondary | ICD-10-CM

## 2012-11-30 DIAGNOSIS — G4730 Sleep apnea, unspecified: Secondary | ICD-10-CM | POA: Diagnosis present

## 2012-11-30 DIAGNOSIS — K219 Gastro-esophageal reflux disease without esophagitis: Secondary | ICD-10-CM | POA: Diagnosis present

## 2012-11-30 HISTORY — PX: TOTAL HIP REVISION: SHX763

## 2012-11-30 LAB — CBC
MCV: 84.2 fL (ref 78.0–100.0)
Platelets: 193 10*3/uL (ref 150–400)
RBC: 3.55 MIL/uL — ABNORMAL LOW (ref 4.22–5.81)
RDW: 14.3 % (ref 11.5–15.5)
WBC: 14.3 10*3/uL — ABNORMAL HIGH (ref 4.0–10.5)

## 2012-11-30 LAB — GRAM STAIN: Gram Stain: NONE SEEN

## 2012-11-30 SURGERY — TOTAL HIP REVISION
Anesthesia: General | Site: Hip | Laterality: Right | Wound class: Clean

## 2012-11-30 MED ORDER — BUPIVACAINE LIPOSOME 1.3 % IJ SUSP
20.0000 mL | Freq: Once | INTRAMUSCULAR | Status: DC
  Filled 2012-11-30 (×2): qty 20

## 2012-11-30 MED ORDER — DEXAMETHASONE SODIUM PHOSPHATE 10 MG/ML IJ SOLN: 10.0000 mg | Freq: Once | INTRAMUSCULAR | Status: DC

## 2012-11-30 MED ORDER — CISATRACURIUM BESYLATE (PF) 10 MG/5ML IV SOLN
INTRAVENOUS | Status: DC | PRN
  Administered 2012-11-30: 3 mg via INTRAVENOUS
  Administered 2012-11-30: 6 mg via INTRAVENOUS

## 2012-11-30 MED ORDER — ACETAMINOPHEN 10 MG/ML IV SOLN
INTRAVENOUS | Status: DC | PRN
  Administered 2012-11-30: 1000 mg via INTRAVENOUS

## 2012-11-30 MED ORDER — CEFAZOLIN SODIUM-DEXTROSE 2-3 GM-% IV SOLR
2.0000 g | INTRAVENOUS | Status: AC
  Administered 2012-11-30: 2 g via INTRAVENOUS

## 2012-11-30 MED ORDER — 0.9 % SODIUM CHLORIDE (POUR BTL) OPTIME
TOPICAL | Status: DC | PRN
  Administered 2012-11-30: 1000 mL

## 2012-11-30 MED ORDER — DEXAMETHASONE SODIUM PHOSPHATE 4 MG/ML IJ SOLN
INTRAMUSCULAR | Status: DC | PRN
  Administered 2012-11-30: 10 mg via INTRAVENOUS

## 2012-11-30 MED ORDER — TRAMADOL HCL 50 MG PO TABS: 50.0000 mg | ORAL_TABLET | Freq: Four times a day (QID) | ORAL | Status: DC | PRN

## 2012-11-30 MED ORDER — KETAMINE HCL 10 MG/ML IJ SOLN
INTRAMUSCULAR | Status: DC | PRN
  Administered 2012-11-30: 10 mg via INTRAVENOUS
  Administered 2012-11-30 (×2): 5 mg via INTRAVENOUS

## 2012-11-30 MED ORDER — DIPHENHYDRAMINE HCL 12.5 MG/5ML PO ELIX: 12.5000 mg | ORAL_SOLUTION | ORAL | Status: DC | PRN

## 2012-11-30 MED ORDER — MENTHOL 3 MG MT LOZG: 1.0000 | LOZENGE | OROMUCOSAL | Status: DC | PRN

## 2012-11-30 MED ORDER — HYDROMORPHONE HCL PF 1 MG/ML IJ SOLN
INTRAMUSCULAR | Status: AC
  Filled 2012-11-30: qty 1

## 2012-11-30 MED ORDER — OXYCODONE HCL 5 MG PO TABS
5.0000 mg | ORAL_TABLET | ORAL | Status: DC | PRN
  Administered 2012-11-30 – 2012-12-01 (×4): 10 mg via ORAL
  Administered 2012-12-02: 20 mg via ORAL
  Filled 2012-11-30 (×4): qty 2
  Filled 2012-11-30: qty 4

## 2012-11-30 MED ORDER — RIVAROXABAN 10 MG PO TABS: 10.0000 mg | ORAL_TABLET | Freq: Every day | ORAL | Status: DC

## 2012-11-30 MED ORDER — RIVAROXABAN 10 MG PO TABS
10.0000 mg | ORAL_TABLET | Freq: Every day | ORAL | Status: DC
  Administered 2012-12-01 – 2012-12-02 (×2): 10 mg via ORAL
  Filled 2012-11-30 (×4): qty 1

## 2012-11-30 MED ORDER — HYDROMORPHONE HCL PF 1 MG/ML IJ SOLN
0.2500 mg | INTRAMUSCULAR | Status: DC | PRN
  Administered 2012-11-30 (×2): 0.5 mg via INTRAVENOUS

## 2012-11-30 MED ORDER — METHOCARBAMOL 500 MG PO TABS
500.0000 mg | ORAL_TABLET | Freq: Four times a day (QID) | ORAL | Status: DC | PRN
  Administered 2012-12-01 – 2012-12-02 (×3): 500 mg via ORAL
  Filled 2012-11-30 (×3): qty 1

## 2012-11-30 MED ORDER — ACETAMINOPHEN 650 MG RE SUPP: 650.0000 mg | Freq: Four times a day (QID) | RECTAL | Status: DC | PRN

## 2012-11-30 MED ORDER — ACETAMINOPHEN 325 MG PO TABS: 650.0000 mg | ORAL_TABLET | Freq: Four times a day (QID) | ORAL | Status: DC | PRN

## 2012-11-30 MED ORDER — METOCLOPRAMIDE HCL 5 MG/ML IJ SOLN: 5.0000 mg | Freq: Three times a day (TID) | INTRAMUSCULAR | Status: DC | PRN

## 2012-11-30 MED ORDER — KETOROLAC TROMETHAMINE 15 MG/ML IJ SOLN
7.5000 mg | Freq: Four times a day (QID) | INTRAMUSCULAR | Status: AC | PRN
  Administered 2012-12-01: 7.5 mg via INTRAVENOUS
  Filled 2012-11-30: qty 1

## 2012-11-30 MED ORDER — ACETAMINOPHEN 10 MG/ML IV SOLN
1000.0000 mg | Freq: Once | INTRAVENOUS | Status: DC
  Filled 2012-11-30: qty 100

## 2012-11-30 MED ORDER — METHOCARBAMOL 500 MG PO TABS: 500.0000 mg | ORAL_TABLET | Freq: Four times a day (QID) | ORAL | Status: DC | PRN

## 2012-11-30 MED ORDER — MORPHINE SULFATE 2 MG/ML IJ SOLN
1.0000 mg | INTRAMUSCULAR | Status: DC | PRN
Start: 2012-11-30 — End: 2012-12-02
  Administered 2012-11-30 (×2): 1 mg via INTRAVENOUS
  Administered 2012-12-01: 2 mg via INTRAVENOUS
  Filled 2012-11-30 (×2): qty 1

## 2012-11-30 MED ORDER — BISACODYL 10 MG RE SUPP: 10.0000 mg | Freq: Every day | RECTAL | Status: DC | PRN

## 2012-11-30 MED ORDER — EPHEDRINE SULFATE 50 MG/ML IJ SOLN
INTRAMUSCULAR | Status: DC | PRN
  Administered 2012-11-30: 2.5 mg via INTRAVENOUS
  Administered 2012-11-30: 5 mg via INTRAVENOUS
  Administered 2012-11-30: 10 mg via INTRAVENOUS
  Administered 2012-11-30 (×4): 5 mg via INTRAVENOUS

## 2012-11-30 MED ORDER — DEXTROSE-NACL 5-0.45 % IV SOLN
INTRAVENOUS | Status: DC
  Administered 2012-11-30: 1000 mL via INTRAVENOUS

## 2012-11-30 MED ORDER — GLYCOPYRROLATE 0.2 MG/ML IJ SOLN
INTRAMUSCULAR | Status: DC | PRN
  Administered 2012-11-30: 0.2 mg via INTRAVENOUS
  Administered 2012-11-30: 0.4 mg via INTRAVENOUS

## 2012-11-30 MED ORDER — PROPOFOL 10 MG/ML IV BOLUS
INTRAVENOUS | Status: DC | PRN
  Administered 2012-11-30: 20 mg via INTRAVENOUS
  Administered 2012-11-30: 200 mg via INTRAVENOUS
  Administered 2012-11-30: 20 mg via INTRAVENOUS

## 2012-11-30 MED ORDER — POLYETHYLENE GLYCOL 3350 17 G PO PACK: 17.0000 g | PACK | Freq: Every day | ORAL | Status: DC | PRN

## 2012-11-30 MED ORDER — METHOCARBAMOL 100 MG/ML IJ SOLN
500.0000 mg | Freq: Four times a day (QID) | INTRAVENOUS | Status: DC | PRN
  Administered 2012-11-30: 500 mg via INTRAVENOUS
  Filled 2012-11-30: qty 5

## 2012-11-30 MED ORDER — ONDANSETRON HCL 4 MG/2ML IJ SOLN
INTRAMUSCULAR | Status: DC | PRN
  Administered 2012-11-30: 2 mg via INTRAVENOUS

## 2012-11-30 MED ORDER — ONDANSETRON HCL 4 MG/2ML IJ SOLN: 4.0000 mg | Freq: Four times a day (QID) | INTRAMUSCULAR | Status: DC | PRN

## 2012-11-30 MED ORDER — METOCLOPRAMIDE HCL 10 MG PO TABS: 5.0000 mg | ORAL_TABLET | Freq: Three times a day (TID) | ORAL | Status: DC | PRN

## 2012-11-30 MED ORDER — ONDANSETRON HCL 4 MG PO TABS: 4.0000 mg | ORAL_TABLET | Freq: Four times a day (QID) | ORAL | Status: DC | PRN

## 2012-11-30 MED ORDER — MIDAZOLAM HCL 5 MG/5ML IJ SOLN
INTRAMUSCULAR | Status: DC | PRN
  Administered 2012-11-30: 1 mg via INTRAVENOUS

## 2012-11-30 MED ORDER — PHENOL 1.4 % MT LIQD: 1.0000 | OROMUCOSAL | Status: DC | PRN

## 2012-11-30 MED ORDER — FENTANYL CITRATE 0.05 MG/ML IJ SOLN
INTRAMUSCULAR | Status: DC | PRN
  Administered 2012-11-30: 25 ug via INTRAVENOUS
  Administered 2012-11-30 (×2): 50 ug via INTRAVENOUS
  Administered 2012-11-30 (×2): 25 ug via INTRAVENOUS
  Administered 2012-11-30 (×4): 50 ug via INTRAVENOUS
  Administered 2012-11-30 (×2): 25 ug via INTRAVENOUS
  Administered 2012-11-30: 50 ug via INTRAVENOUS

## 2012-11-30 MED ORDER — SODIUM CHLORIDE 0.9 % IV SOLN: INTRAVENOUS | Status: DC

## 2012-11-30 MED ORDER — BUPIVACAINE LIPOSOME 1.3 % IJ SUSP
INTRAMUSCULAR | Status: DC | PRN
  Administered 2012-11-30: 20 mL

## 2012-11-30 MED ORDER — SODIUM CHLORIDE 0.9 % IJ SOLN
INTRAMUSCULAR | Status: DC | PRN
  Administered 2012-11-30: 30 mL via INTRAVENOUS

## 2012-11-30 MED ORDER — DOCUSATE SODIUM 100 MG PO CAPS
100.0000 mg | ORAL_CAPSULE | Freq: Two times a day (BID) | ORAL | Status: DC
  Administered 2012-11-30 – 2012-12-02 (×4): 100 mg via ORAL

## 2012-11-30 MED ORDER — CEFAZOLIN SODIUM-DEXTROSE 2-3 GM-% IV SOLR
INTRAVENOUS | Status: AC
  Filled 2012-11-30: qty 50

## 2012-11-30 MED ORDER — OXYCODONE HCL 5 MG PO TABS: 5.0000 mg | ORAL_TABLET | ORAL | Status: DC | PRN

## 2012-11-30 MED ORDER — CEFAZOLIN SODIUM 1-5 GM-% IV SOLN
1.0000 g | Freq: Four times a day (QID) | INTRAVENOUS | Status: AC
  Administered 2012-11-30 – 2012-12-01 (×2): 1 g via INTRAVENOUS
  Filled 2012-11-30 (×2): qty 50

## 2012-11-30 MED ORDER — NEOSTIGMINE METHYLSULFATE 1 MG/ML IJ SOLN
INTRAMUSCULAR | Status: DC | PRN
  Administered 2012-11-30: 3 mg via INTRAVENOUS

## 2012-11-30 MED ORDER — PROPOFOL INFUSION 10 MG/ML OPTIME: INTRAVENOUS | Status: DC | PRN

## 2012-11-30 MED ORDER — DEXAMETHASONE SODIUM PHOSPHATE 10 MG/ML IJ SOLN
10.0000 mg | Freq: Every day | INTRAMUSCULAR | Status: AC
  Filled 2012-11-30: qty 1

## 2012-11-30 MED ORDER — LACTATED RINGERS IV SOLN
INTRAVENOUS | Status: DC | PRN
  Administered 2012-11-30 (×3): via INTRAVENOUS

## 2012-11-30 MED ORDER — BUPIVACAINE HCL 0.25 % IJ SOLN
INTRAMUSCULAR | Status: DC | PRN
  Administered 2012-11-30: 20 mL

## 2012-11-30 MED ORDER — DEXAMETHASONE 6 MG PO TABS
10.0000 mg | ORAL_TABLET | Freq: Every day | ORAL | Status: AC
  Administered 2012-12-01: 10 mg via ORAL
  Filled 2012-11-30: qty 1

## 2012-11-30 MED ORDER — CHLORHEXIDINE GLUCONATE 4 % EX LIQD: 60.0000 mL | Freq: Once | CUTANEOUS | Status: DC

## 2012-11-30 MED ORDER — LACTATED RINGERS IV SOLN: INTRAVENOUS | Status: DC

## 2012-11-30 MED ORDER — FLEET ENEMA 7-19 GM/118ML RE ENEM: 1.0000 | ENEMA | Freq: Once | RECTAL | Status: AC | PRN

## 2012-11-30 MED ORDER — BUPIVACAINE HCL (PF) 0.25 % IJ SOLN
INTRAMUSCULAR | Status: AC
  Filled 2012-11-30: qty 30

## 2012-11-30 SURGICAL SUPPLY — 65 items
BAG ZIPLOCK 12X15 (MISCELLANEOUS) ×6 IMPLANT
BIT DRILL 2.8X128 (BIT) ×2 IMPLANT
BLADE EXTENDED COATED 6.5IN (ELECTRODE) ×2 IMPLANT
BLADE SAW SAG 73X25 THK (BLADE) ×1
BLADE SAW SGTL 13.0X1.19X90.0M (BLADE) ×2 IMPLANT
BLADE SAW SGTL 73X25 THK (BLADE) ×1 IMPLANT
CABLE (Orthopedic Implant) ×6 IMPLANT
CLOTH BEACON ORANGE TIMEOUT ST (SAFETY) ×2 IMPLANT
CONT SPECI 4OZ STER CLIK (MISCELLANEOUS) IMPLANT
CUP PINNACLE SZ 58MM HIP (Cup) ×2 IMPLANT
DRAPE INCISE IOBAN 66X45 STRL (DRAPES) ×2 IMPLANT
DRAPE ORTHO SPLIT 77X108 STRL (DRAPES) ×2
DRAPE POUCH INSTRU U-SHP 10X18 (DRAPES) ×2 IMPLANT
DRAPE SURG ORHT 6 SPLT 77X108 (DRAPES) ×2 IMPLANT
DRAPE U-SHAPE 47X51 STRL (DRAPES) ×2 IMPLANT
DRSG ADAPTIC 3X8 NADH LF (GAUZE/BANDAGES/DRESSINGS) ×2 IMPLANT
DRSG EMULSION OIL 3X16 NADH (GAUZE/BANDAGES/DRESSINGS) ×2 IMPLANT
DRSG MEPILEX BORDER 4X4 (GAUZE/BANDAGES/DRESSINGS) ×4 IMPLANT
DRSG MEPILEX BORDER 4X8 (GAUZE/BANDAGES/DRESSINGS) IMPLANT
DURAPREP 26ML APPLICATOR (WOUND CARE) ×2 IMPLANT
ELECT REM PT RETURN 9FT ADLT (ELECTROSURGICAL) ×2
ELECTRODE REM PT RTRN 9FT ADLT (ELECTROSURGICAL) ×1 IMPLANT
EVACUATOR 1/8 PVC DRAIN (DRAIN) ×2 IMPLANT
FACESHIELD LNG OPTICON STERILE (SAFETY) ×8 IMPLANT
GLOVE BIO SURGEON STRL SZ7.5 (GLOVE) ×2 IMPLANT
GLOVE BIO SURGEON STRL SZ8 (GLOVE) ×2 IMPLANT
GLOVE BIOGEL PI IND STRL 8 (GLOVE) ×3 IMPLANT
GLOVE BIOGEL PI INDICATOR 8 (GLOVE) ×3
GLOVE SURG SS PI 6.5 STRL IVOR (GLOVE) ×4 IMPLANT
GOWN STRL NON-REIN LRG LVL3 (GOWN DISPOSABLE) ×6 IMPLANT
GOWN STRL REIN XL XLG (GOWN DISPOSABLE) ×4 IMPLANT
IMMOBILIZER KNEE 20 (SOFTGOODS) ×2
IMMOBILIZER KNEE 20 THIGH 36 (SOFTGOODS) ×1 IMPLANT
KIT BASIN OR (CUSTOM PROCEDURE TRAY) ×2 IMPLANT
LINER MARATHON 10D +4X52X32 (Hips) ×2 IMPLANT
MANIFOLD NEPTUNE II (INSTRUMENTS) ×2 IMPLANT
NDL SAFETY ECLIPSE 18X1.5 (NEEDLE) IMPLANT
NEEDLE HYPO 18GX1.5 SHARP (NEEDLE)
NS IRRIG 1000ML POUR BTL (IV SOLUTION) ×2 IMPLANT
PACK TOTAL JOINT (CUSTOM PROCEDURE TRAY) ×2 IMPLANT
PASSER SUT SWANSON 36MM LOOP (INSTRUMENTS) ×2 IMPLANT
POSITIONER SURGICAL ARM (MISCELLANEOUS) ×2 IMPLANT
SCREW 6.5MMX25MM (Screw) ×2 IMPLANT
SCREW PERIPHERAL BONE SZ 5M (Screw) ×2 IMPLANT
SCREW PERIPHERAL BONE SZ 5MX45 (Screw) ×2 IMPLANT
SCREW PINN CAN BONE 6.5MMX15MM (Screw) ×2 IMPLANT
SPONGE GAUZE 4X4 12PLY (GAUZE/BANDAGES/DRESSINGS) ×2 IMPLANT
SPONGE LAP 18X18 X RAY DECT (DISPOSABLE) ×6 IMPLANT
SROM BIOLOX DELAT CERAMIC HEAD (Hips) ×2 IMPLANT
STAPLER VISISTAT 35W (STAPLE) ×2 IMPLANT
STEM LG SROM 36/8 18X13X215R (Hips) ×2 IMPLANT
SUCTION FRAZIER 12FR DISP (SUCTIONS) ×2 IMPLANT
SUCTION FRAZIER TIP 10 FR DISP (SUCTIONS) ×2 IMPLANT
SUT ETHIBOND NAB CT1 #1 30IN (SUTURE) ×4 IMPLANT
SUT VIC AB 1 CT1 27 (SUTURE) ×3
SUT VIC AB 1 CT1 27XBRD ANTBC (SUTURE) ×3 IMPLANT
SUT VIC AB 2-0 CT1 27 (SUTURE) ×3
SUT VIC AB 2-0 CT1 TAPERPNT 27 (SUTURE) ×3 IMPLANT
SUT VLOC 180 0 24IN GS25 (SUTURE) ×4 IMPLANT
SWAB COLLECTION DEVICE MRSA (MISCELLANEOUS) ×2 IMPLANT
SYR 50ML LL SCALE MARK (SYRINGE) IMPLANT
TOWEL OR 17X26 10 PK STRL BLUE (TOWEL DISPOSABLE) ×4 IMPLANT
TRAY FOLEY CATH 14FRSI W/METER (CATHETERS) IMPLANT
TUBE ANAEROBIC SPECIMEN COL (MISCELLANEOUS) ×2 IMPLANT
WATER STERILE IRR 1500ML POUR (IV SOLUTION) ×4 IMPLANT

## 2012-11-30 NOTE — Anesthesia Preprocedure Evaluation (Signed)
Anesthesia Evaluation  Patient identified by MRN, date of birth, ID band Patient awake    Reviewed: Allergy & Precautions, H&P , NPO status , Patient's Chart, lab work & pertinent test results  Airway Mallampati: II TM Distance: >3 FB Neck ROM: Full    Dental no notable dental hx. (+) Chipped and Poor Dentition   Pulmonary sleep apnea ,  breath sounds clear to auscultation  Pulmonary exam normal       Cardiovascular negative cardio ROS  Rhythm:Regular Rate:Normal     Neuro/Psych negative neurological ROS  negative psych ROS   GI/Hepatic Neg liver ROS, hiatal hernia, GERD-  ,  Endo/Other  negative endocrine ROS  Renal/GU negative Renal ROS  negative genitourinary   Musculoskeletal negative musculoskeletal ROS (+)   Abdominal   Peds negative pediatric ROS (+)  Hematology negative hematology ROS (+)   Anesthesia Other Findings   Reproductive/Obstetrics negative OB ROS                           Anesthesia Physical  Anesthesia Plan  ASA: II  Anesthesia Plan: General   Post-op Pain Management:    Induction:   Airway Management Planned: Oral ETT  Additional Equipment:   Intra-op Plan:   Post-operative Plan: Extubation in OR  Informed Consent: I have reviewed the patients History and Physical, chart, labs and discussed the procedure including the risks, benefits and alternatives for the proposed anesthesia with the patient or authorized representative who has indicated his/her understanding and acceptance.   Dental advisory given  Plan Discussed with: CRNA  Anesthesia Plan Comments:         Anesthesia Quick Evaluation

## 2012-11-30 NOTE — Brief Op Note (Signed)
11/30/2012  7:56 PM  PATIENT:  Steve [name]  57 y.o. male  PRE-OPERATIVE DIAGNOSIS:  Right hip resection arthroplasty secondary to infection  POST-OPERATIVE DIAGNOSIS:  Right hip resection arthroplasty secondary to infection   PROCEDURE:  Procedure(s): RIGHT TOTAL HIP ARTHROPLASTY REIMPLANTATION (Right)  SURGEON:  Surgeon(s) and Role:    * [name], MD - Primary  PHYSICIAN ASSISTANT:   ASSISTANTS: Ardeen Jourdain, PA-C   ANESTHESIA:   general  EBL:  Total I/O In: 1000 [I.V.:1000] Out: 300 [Blood:300]  BLOOD ADMINISTERED:none  DRAINS: (Medium) Hemovact drain(s) in the right hip with  Suction Open   LOCAL MEDICATIONS USED:  OTHER Exparel  SPECIMEN:  No Specimen  COUNTS:  YES  TOURNIQUET:  * No tourniquets in log *  DICTATION: .Other Dictation: Dictation Number [id number]  PLAN OF CARE: Admit to inpatient   PATIENT DISPOSITION:  PACU - hemodynamically stable.

## 2012-11-30 NOTE — Anesthesia Postprocedure Evaluation (Signed)
  Anesthesia Post-op Note  Anesthesia Post Note  [name]  Procedure(s) Performed: Procedure(s) (LRB): RIGHT TOTAL HIP ARTHROPLASTY REIMPLANTATION (Right)  Anesthesia type: General  Patient location: PACU  Post pain: Pain level controlled  Post assessment: Post-op Vital signs reviewed  Last Vitals:  Filed Vitals:   11/30/12 2110  BP:   Pulse: 67  Temp:   Resp: 21    Post vital signs: Reviewed  Level of consciousness: sedated  Complications: No apparent anesthesia complications

## 2012-11-30 NOTE — H&P (View-Only) (Signed)
Steve [name]  DOB: 12/29/1955 Married / Language: Cleophus Molt / Race: White Male  Date of Admission:  11/30/2012  Chief Complaint:  Infected Right Total Hip  History of Present Illness The patient is a 57 year old male who comes in for a preoperative History and Physical. The patient is scheduled for a right total hip reimplantation to be performed by Dr. Dione Plover. Aluisio, MD at [facility] on 11/30/2012. The patient is a 57 year old male presenting for a post-operative visit. The patient comes in status post from right hip resection arthroplasty with antibiotic spacer. The patient states that he is getting better at this time. The pain is under fair control at this time and describes their pain as mild to moderate. They are currently on Oxycodone for their pain. The patient is currently taking 4 pill(s) (max per day) per day. The patient feels that they are progressing well at this time. Steve [name] is feeling better at this time, less pain in the hip. He is not having any swelling. No fevers or chills or infectious symptoms. He is now ready for the reimplantation procedure. They have been treated conservatively in the past for the above stated problem and despite conservative measures, they continue to have progressive pain and severe functional limitations and dysfunction. They have underwent PICC line antibiotic management. It is felt that they would benefit from undergoing total joint reimplantation. Risks and benefits of the procedure have been discussed with the patient and they elect to proceed with surgery. There are no active contraindications to surgery such as ongoing infection or rapidly progressive neurological disease.   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  08/23/2012   Family History Heart disease in male family member before age 43 Cancer. mother Chronic Obstructive Lung Disease. mother and father Drug / Alcohol Addiction. mother and father   Social History Tobacco use. never smoker Number of flights of stairs before winded. 4-5 Marital status. married Tobacco / smoke exposure. yes Pain Contract. no Living situation. live with spouse Drug/Alcohol Rehab (Previously). no Exercise. Exercises rarely; does other Illicit drug use. no Drug/Alcohol Rehab (Currently). no Alcohol use. current drinker; drinks hard liquor; only occasionally per week Children. 1 Current work status. working full time   Medication History OxyCODONE HCl (5MG  Tablet, 1-2 Tablet Oral po bid, Taken starting 10/25/2012) Active. Robaxin (500MG  Tablet, Oral) Active. Aspirin EC (81MG  Tablet DR, Oral) Active.   Past Surgical History Rotator Cuff Repair. right Total Hip Replacement. bilateral: Right Hip - 2002, Left Hip - 2007 Resection Arthroplasty Right Hip   Medical History Diverticulitis Of Colon Gastroesophageal Reflux Disease Sleep Apnea. 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 10/07/1998 Sprain/strain, ankle NOS (845.00). 10/04/2000 Osteoarthrosis NOS, pelvis/thigh (715.95). 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 12/29/2000  Review of Systems General:Not Present- Chills, Fever, Night Sweats, Fatigue, Weight Gain, Weight Loss and Memory Loss. Skin:Not Present- Hives, Itching, Rash, Eczema and Lesions. HEENT:Not Present- Tinnitus, Headache, Double Vision, Visual Loss, Hearing Loss and Dentures. Respiratory:Not Present- Shortness of breath with exertion, Shortness of breath at rest, Allergies, Coughing up blood and Chronic Cough. Cardiovascular:Not  Present- Chest Pain, Racing/skipping heartbeats, Difficulty Breathing Lying Down, Murmur, Swelling and Palpitations. Gastrointestinal:Not Present- Bloody Stool, Heartburn, Abdominal Pain, Vomiting, Nausea, Constipation, Diarrhea, Difficulty  Swallowing, Jaundice and Loss of appetitie. Male Genitourinary:Not Present- Urinary frequency, Blood in Urine, Weak urinary stream, Discharge, Flank Pain, Incontinence, Painful Urination, Urgency, Urinary Retention and Urinating at Night. Musculoskeletal:Present- Back Pain and Morning Stiffness. Not Present- Muscle Weakness, Muscle Pain, Joint Swelling, Joint Pain and Spasms. Neurological:Not Present- Tremor, Dizziness, Blackout spells, Paralysis, Difficulty with balance and Weakness. Psychiatric:Not Present- Insomnia.   Vitals Weight: 190 lb Height: 66 in Weight was reported by patient. Height was reported by patient. Body Surface Area: 2 m Body Mass Index: 30.67 kg/m Pulse: 64 (Regular) Resp.: 14 (Unlabored) BP: 122/82 (Sitting, Left Arm, Standard)    Physical Exam The physical exam findings are as follows:   General Mental Status - Alert, cooperative and good historian. General Appearance- pleasant. Not in acute distress. Orientation- Oriented X3. Build & Nutrition- Well nourished and Well developed.   Head and Neck Head- normocephalic, atraumatic . Neck Global Assessment- supple. no bruit auscultated on the right and no bruit auscultated on the left.   Eye Pupil- Bilateral- Regular and Round. Motion- Bilateral- EOMI.   Chest and Lung Exam Auscultation: Breath sounds:- clear at anterior chest wall and - clear at posterior chest wall. Adventitious sounds:- No Adventitious sounds.   Cardiovascular Auscultation:Rhythm- Regular rate and rhythm. Heart Sounds- S1 WNL and S2 WNL. Murmurs & Other Heart Sounds:Auscultation of the heart reveals - No  Murmurs.   Abdomen Palpation/Percussion:Tenderness- Abdomen is non-tender to palpation. Rigidity (guarding)- Abdomen is soft. Auscultation:Auscultation of the abdomen reveals - Bowel sounds normal.   Male Genitourinary Not done, not pertinent to present illness  Musculoskeletal Well-developed male in no distress. On examination, there is no significant swelling about the thigh. He is nontender around the incision. Felxion about 100 degrees, rotate 20 degrees in each direction and abduct 30 degrees without discomfort on range of motion.  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 Wynelle Link.  Plan is to go to home with his sister.  The patient does not have any contraindications and will recieve TXA (tranexamic acid) prior to surgery.  Signed electronically by Joelene Millin, III PA-C

## 2012-11-30 NOTE — Transfer of Care (Signed)
Immediate Anesthesia Transfer of Care Note  [name]  Procedure(s) Performed: Procedure(s): RIGHT TOTAL HIP ARTHROPLASTY REIMPLANTATION (Right)  Patient Location: PACU  Anesthesia Type:General  Level of Consciousness: awake, alert , oriented and patient cooperative  Airway & Oxygen Therapy: Patient Spontanous Breathing and Patient connected to face mask oxygen  Post-op Assessment: Report given to PACU RN and Post -op Vital signs reviewed and stable  Post vital signs: stable  Complications: No apparent anesthesia complications

## 2012-11-30 NOTE — Interval H&P Note (Signed)
History and Physical Interval Note:  11/30/2012 4:21 PM  Steve [name]  has presented today for surgery, with the diagnosis of RESECTION ARTHROPLASTY OF THE RIGHT HIP  The various methods of treatment have been discussed with the patient and family. After consideration of risks, benefits and other options for treatment, the patient has consented to  Procedure(s): RIGHT TOTAL HIP ARTHROPLASTY REIMPLANTATION (Right) as a surgical intervention .  The patient's history has been reviewed, patient examined, no change in status, stable for surgery.  I have reviewed the patient's chart and labs.  Questions were answered to the patient's satisfaction.     [name]

## 2012-12-01 LAB — BASIC METABOLIC PANEL
BUN: 11 mg/dL (ref 6–23)
CO2: 23 mEq/L (ref 19–32)
Chloride: 100 mEq/L (ref 96–112)
GFR calc non Af Amer: >90 mL/min (ref >90)
Glucose, Bld: 204 mg/dL — ABNORMAL HIGH (ref 70–99)
Potassium: 4.1 mEq/L (ref 3.5–5.1)
Sodium: 133 mEq/L — ABNORMAL LOW (ref 135–145)

## 2012-12-01 LAB — CBC
HCT: 32.7 % — ABNORMAL LOW (ref 39.0–52.0)
Hemoglobin: 10.6 g/dL — ABNORMAL LOW (ref 13.0–17.0)
MCHC: 32.4 g/dL (ref 30.0–36.0)
RBC: 3.9 MIL/uL — ABNORMAL LOW (ref 4.22–5.81)

## 2012-12-01 MED ORDER — BIOTENE DRY MOUTH MT LIQD
15.0000 mL | Freq: Two times a day (BID) | OROMUCOSAL | Status: DC
  Administered 2012-12-01: 15 mL via OROMUCOSAL

## 2012-12-01 MED ORDER — CHLORHEXIDINE GLUCONATE 0.12 % MT SOLN
15.0000 mL | Freq: Two times a day (BID) | OROMUCOSAL | Status: DC
  Administered 2012-12-01 (×2): 15 mL via OROMUCOSAL
  Filled 2012-12-01 (×4): qty 15

## 2012-12-01 NOTE — Progress Notes (Signed)
Per pt request, his belongings locked up in security were returned to patient.  Pt reports all belongings were returned.

## 2012-12-01 NOTE — Evaluation (Signed)
Physical Therapy Evaluation Patient Details Name: Steve [name] MRN: [id number] DOB: May 05, 1955 Today's Date: 12/01/2012 Time: [id number] PT Time Calculation (min): 31 min  PT Assessment / Plan / Recommendation History of Present Illness     Clinical Impression  Pt s/p reimplantation R THR presents with decreased R LE strength/ROM, posterior THP, post op pain and PWB status limiting functional mobility.  Pt should progress well to d/c home with family assist and HHPT follow up.    PT Assessment  Patient needs continued PT services    Follow Up Recommendations  Home health PT    Does the patient have the potential to tolerate intense rehabilitation      Barriers to Discharge        Equipment Recommendations  None recommended by PT    Recommendations for Other Services     Frequency 7X/week    Precautions / Restrictions Precautions Precautions: Posterior Hip;Fall Precaution Booklet Issued: Yes (comment) Precaution Comments: sign hung on wall Restrictions Weight Bearing Restrictions: Yes RLE Weight Bearing: Partial weight bearing RLE Partial Weight Bearing Percentage or Pounds: 25-50%   Pertinent Vitals/Pain 4/10; premed, ice pack provided      Mobility  Bed Mobility Bed Mobility: Supine to Sit Supine to Sit: 4: Min assist Details for Bed Mobility Assistance: cues for sequence and use of L LE to self assist Transfers Transfers: Sit to Stand;Stand to Sit Sit to Stand: 4: Min assist Stand to Sit: 4: Min assist Details for Transfer Assistance: cues for LE management and use of UEs to self assist Ambulation/Gait Ambulation/Gait Assistance: 4: Min assist Ambulation Distance (Feet): 75 Feet Assistive device: Rolling walker Ambulation/Gait Assistance Details: cues for posture, stride length and position from RW Gait Pattern: Step-to pattern Stairs: No    Exercises Total Joint Exercises Ankle Circles/Pumps: AROM;Both;10 reps;Supine Quad Sets: AROM;Both;10  reps;Supine Heel Slides: AAROM;15 reps;Right;Supine Hip ABduction/ADduction: AAROM;Right;10 reps;Supine   PT Diagnosis: Difficulty walking  PT Problem List: Decreased strength;Decreased range of motion;Decreased activity tolerance;Decreased mobility;Decreased knowledge of use of DME;Pain;Decreased knowledge of precautions PT Treatment Interventions: DME instruction;Gait training;Stair training;Functional mobility training;Therapeutic activities;Therapeutic exercise;Patient/family education     PT Goals(Current goals can be found in the care plan section) Acute Rehab PT Goals Patient Stated Goal: Back to work PT Goal Formulation: With patient Time For Goal Achievement: 12/06/12 Potential to Achieve Goals: Good  Visit Information  Last PT Received On: 12/01/12 Assistance Needed: +1       Prior [place] expects to be discharged to:: Private residence Living Arrangements: Spouse/significant other Available Help at Discharge: Family Type of Home: House Home Access: Stairs to enter Technical brewer of Steps: 4 Entrance Stairs-Rails: Right;Left;Can reach both Cylinder: One [place]: Environmental consultant - 2 wheels;Bedside commode;Shower seat Additional Comments: Pt will be discharging to sister's  home Prior Function Level of Independence: Independent;Independent with assistive device(s) Communication Communication: No difficulties    Cognition  Cognition Arousal/Alertness: Awake/alert Behavior During Therapy: WFL for tasks assessed/performed Overall Cognitive Status: Within Functional Limits for tasks assessed    Extremity/Trunk Assessment Upper Extremity Assessment Upper Extremity Assessment: Overall WFL for tasks assessed Lower Extremity Assessment Lower Extremity Assessment: RLE deficits/detail RLE Deficits / Details: hip strength 3-/5 with AAROM at hip to 90 flex and 15 abd   Balance    End of Session PT - End of  Session Equipment Utilized During Treatment: Gait belt Activity Tolerance: Patient tolerated treatment well Patient left: in chair;with call bell/phone within reach Nurse Communication: Mobility status  GP     Demitra Danley 12/01/2012, 12:17 PM

## 2012-12-01 NOTE — Op Note (Signed)
NAMEMIKAI, KRUMMEN             ACCOUNT NO.:  [id number]  MEDICAL RECORD NO.:  [id number]  LOCATION:  26                         FACILITY:  [facility]  PHYSICIAN:  Gaynelle Arabian, M.D.    DATE OF BIRTH:  05/14/1955  DATE OF PROCEDURE:  11/30/2012 DATE OF DISCHARGE:                              OPERATIVE REPORT   PREOPERATIVE DIAGNOSIS:  Resection of arthroplasty right hip secondary to infection.  POSTOPERATIVE DIAGNOSIS:  Resection of arthroplasty right hip secondary to infection.  PROCEDURE:  Right total hip arthroplasty reimplantation.  SURGEON:  Gaynelle Arabian, MD.  ASSISTANT:  Ardeen Jourdain, PA.  ANESTHESIA:  General.  ESTIMATED BLOOD LOSS:  1300.  DRAINS:  Hemovac x1.  COMPLICATIONS:  None.  CONDITION:  Stable to recovery.  BRIEF CLINICAL NOTE:  Darkiel is a 57 year old male who had a total hip arthroplasty many years ago, did fine, and then unfortunately developed a significant dental infection which subsequently led to seating of his hip with a right hip infection.  We had to perform a resection arthroplasty antibiotic spacer approximately 3 months ago.  His lab work has returned to normal, and there were no signs of infection.  He presents now for bilateral total hip arthroplasty reimplantation.  PROCEDURE IN DETAIL:  After successful administration of general anesthetic, the patient was placed in left lateral decubitus position with the right side up and held with the hip positioner.  Right lower extremity was prepped and draped in a usual sterile fashion.  Previous posterolateral incisions were utilized.  Skin cut with a 10 blade through subcutaneous tissue to the fascia lata which was incised in line with the skin incision.  The sciatic nerve was palpated and protected. The soft tissue was isolated off the posterior femur to expose the joint.  There was minimal fluid in the joint.  It was sent for stat Gram stain, which showed no organisms and rare white  cells.  The hip was subsequently dislocated and the femoral head removed.  The cement around the cement spacer and the femur were then removed and the stem removed. He still had the sleeve intact from his S-ROM original component.  While removing the original component part of the femoral stem, 1 of the splines of the S-ROM had broken off and was lodged in the femur.  We could not resect it during his original procedure as he had significant blood loss and was required femoral osteotomy to remove it.  I did ream through the femoral sleeve today up to 14 mm in an attempt to remove that spline but it was lodged in the bone.  We decided to turn our attention to the acetabulum prior to doing a femoral osteotomy to remove this.  The femur was retracted anteriorly to gain acetabular exposure.  The cement and the all polyethylene Prostalac acetabular component were removed.  Fortunately, there was no significant defect on the posterior and anterior column.  There was some medial wall defect.  We ended up reaming up to 56 mm and utilized the remaining to graft the medial wall. A 58 mm pinnacle acetabular revision shell was then placed in anatomic position with good purchase and transfixed with 2 domes and 2  peripheral screws.  The 32-mm neutral +4 10 degree liner was then placed with a 10- degree elevated liner in the superior position.  We then turned our attention again to the femur.  I made an incision in the fascia of the vastus lateralis and elevated the muscle off the posterior intermuscular septum to gain an access to the lateral femur. I drilled then onto the femur, at that position I felt that the metal would be present.  We then made about a 3 inch cortical window utilizing the drill holes to outline the window and an oscillating saw to make the osteotomy.  Window was removed and I was able to identify the metal spline.  I then had to use a small osteotome to remove the spline  from the bone as there was bone on growth onto it.  I was then able to remove it without any bone loss.  Subsequently, reamed distally with the thin shaft reamers up to 14.5 mm so I could place a long 13 stem.  We then replaced the window and secured it with 2 Zimmer cables.  I placed another cable distal to the window to protect against a stress riser. With that, we then went proximally back to the femur and did the reaming down all the way from the sleeve down the femoral canal starting at 10 mm and increasing up to 14.5 mm.  I then placed a trial 18 x 13 long stem which was bowed for right femur with a 36+ 8 neck.  A 32+ 6 head was placed and the hips reduced with outstanding stability.  Full extension, full external rotation, 70 degrees flexion, 40 degrees adduction, about 60 degrees internal rotation, 90 degrees of flexion, 60 degrees internal rotation.  By placing the right leg on top of the left leg lengths were equal.  The hip was dislocated and trials were removed. The permanent 18 x 13 long right stem with a 36+ 8 neck was then impacted and it ended up being about 20 degrees of anteversion.  A 32+ 6 ceramic head was placed and hips reduced to same stability parameters. I inspected the lateral femur, did not see any fracture line propagating pass the window.  There were actually no fracture lines at all.  The wound was then copiously irrigated with saline solution.  Fascia of the vastus lateralis was closed with running #1 Vicryl.  The posterior tissues were reattached to the greater trochanter with a #1 Ethibond suture.  About 20 mL of Exparel with 30 mL of saline were injected into the subcu tissues,  muscle and fascia lata, and then an additional 20 mL of 0.25% bupivacaine was also injected.  The fascia lata was closed over Hemovac drain with running #1 V-Loc suture.  Subcu was closed in 2 layers with #1 V-Loc and 2-0 Vicryl.  The skin was closed with staples. The drains  hooked to suction.  Incision cleaned and dried and a bulky sterile dressing applied.  He was subsequently awakened and transported to recovery in stable condition.  Note that the surgical assistant was a medical necessity for this procedure for obvious reasons outlined above.  Assistant was necessary for retraction of vital neurovascular structures and proper placement of the limb for both removal of the prosthesis and placement of the new prosthesis.     Gaynelle Arabian, M.D.     FA/MEDQ  D:  11/30/2012  T:  12/01/2012  Job:  [id number]

## 2012-12-01 NOTE — Progress Notes (Signed)
   Subjective: 1 Day Post-Op Procedure(s) (LRB): RIGHT TOTAL HIP ARTHROPLASTY REIMPLANTATION (Right) Patient reports pain as moderate but well controlled   We will start therapy today.  Plan is to go Home after hospital stay.  Objective: Vital signs in last 24 hours: Temp:  [97.2 F (36.2 C)-98.2 F (36.8 C)] 98.2 F (36.8 C) (08/23 0506) Pulse Rate:  [61-85] 72 (08/23 0506) Resp:  [12-26] 26 (08/23 0506) BP: (116-141)/(76-88) 116/76 mmHg (08/23 0506) SpO2:  [95 %-100 %] 97 % (08/23 0506) Weight:  [192 lb 6.4 oz (87.272 kg)] 192 lb 6.4 oz (87.272 kg) (08/22 1252)  Intake/Output from previous day:  Intake/Output Summary (Last 24 hours) at 12/01/12 0818 Last data filed at 12/01/12 0600  Gross per 24 hour  Intake   5545 ml  Output   2385 ml  Net   3160 ml    Intake/Output this shift:    Labs:  Recent Labs  11/30/12 2018 12/01/12 0453  HGB 9.9* 10.6*    Recent Labs  11/30/12 2018 12/01/12 0453  WBC 14.3* 11.7*  RBC 3.55* 3.90*  HCT 29.9* 32.7*  PLT 193 262    Recent Labs  12/01/12 0453  NA 133*  K 4.1  CL 100  CO2 23  BUN 11  CREATININE 0.73  GLUCOSE 204*  CALCIUM 8.4   No results found for this basename: LABPT, INR,  in the last 72 hours  EXAM General - Patient is Alert, Appropriate and Oriented Extremity - Neurologically intact Neurovascular intact No cellulitis present Compartment soft Dressing - dressing C/D/I Motor Function - intact, moving foot and toes well on exam.  Hemovac pulled without difficulty.  Past Medical History  Diagnosis Date  . Abscess of muscle 08/10/2011    staph infection of right hip   . Hip dysplasia, congenital     no surgery as a child for hip dysplasia - has had bilateral hip replacements as an adult  . Postoperative anemia due to acute blood loss 09/07/2012  . GERD (gastroesophageal reflux disease)     rare reflux - no meds for reflux - NO PROBLEM IN PAST SEVERAL YRS  . Septic arthritis of hip 09/05/2012   PT'S TOTAL HIP JOINT REMOVED - ANTIBIOTIC SPACE PLACED AND PT HAS FINISHED IV ANTIBIOTICS ( PICC LINE REMOVED)  . Sleep apnea     USES CPAP    Assessment/Plan: 1 Day Post-Op Procedure(s) (LRB): RIGHT TOTAL HIP ARTHROPLASTY REIMPLANTATION (Right) Active Problems:   * No active hospital problems. *   Advance diet Up with therapy D/C IV fluids Discharge home with home health once doing well enough with PT and comfortable enough. Tomorrow or Monday depending on progress  DVT Prophylaxis - Xarelto 50% PWB RLE D/C Knee Immobilizer Hemovac Pulled Begin Therapy Hip Preacutions   [name]

## 2012-12-01 NOTE — Progress Notes (Signed)
Physical Therapy Treatment Patient Details Name: Steve [name] MRN: [id number] DOB: 11-Jun-1955 Today's Date: 12/01/2012 Time: [id number] PT Time Calculation (min): 20 min  PT Assessment / Plan / Recommendation  History of Present Illness     PT Comments   From PT perspective, pt progressing well and should be ready for d/c to sister's home tomorrow  Follow Up Recommendations  Home health PT     Does the patient have the potential to tolerate intense rehabilitation     Barriers to Discharge        Equipment Recommendations  None recommended by PT    Recommendations for Other Services    Frequency 7X/week   Progress towards PT Goals Progress towards PT goals: Progressing toward goals  Plan Current plan remains appropriate    Precautions / Restrictions Precautions Precautions: Posterior Hip;Fall Precaution Booklet Issued: Yes (comment) Precaution Comments: sign hung on wall Restrictions Weight Bearing Restrictions: Yes RLE Weight Bearing: Partial weight bearing RLE Partial Weight Bearing Percentage or Pounds: 25-50%   Pertinent Vitals/Pain 7/10; MEds requested, ice pack provided    Mobility  Bed Mobility Bed Mobility: Sit to Supine Sit to Supine: 4: Min assist Details for Bed Mobility Assistance: cues for sequence and use of L LE to self assist Transfers Transfers: Sit to Stand;Stand to Sit Sit to Stand: 4: Min guard Stand to Sit: 4: Min guard Details for Transfer Assistance: cues for LE management and use of UEs to self assist Ambulation/Gait Ambulation/Gait Assistance: 4: Min guard Ambulation Distance (Feet): 180 Feet Assistive device: Rolling walker Ambulation/Gait Assistance Details: cues for posture and position from RW Gait Pattern: Step-to pattern Stairs: No    Exercises     PT Diagnosis:    PT Problem List:   PT Treatment Interventions:     PT Goals (current goals can now be found in the care plan section) Acute Rehab PT Goals Patient Stated  Goal: Back to work PT Goal Formulation: With patient Time For Goal Achievement: 12/06/12 Potential to Achieve Goals: Good  Visit Information  Last PT Received On: 12/01/12 Assistance Needed: +1    Subjective Data  Patient Stated Goal: Back to work   Cognition  Cognition Arousal/Alertness: Awake/alert Behavior During Therapy: WFL for tasks assessed/performed Overall Cognitive Status: Within Functional Limits for tasks assessed    Balance     End of Session PT - End of Session Activity Tolerance: Patient tolerated treatment well Patient left: in bed;with call bell/phone within reach Nurse Communication: Mobility status   GP     Steve [name] 12/01/2012, 4:39 PM

## 2012-12-01 NOTE — Progress Notes (Signed)
Xarelto teaching done; pt given printed information as well; pt verbalized understanding information.

## 2012-12-01 NOTE — Progress Notes (Signed)
OT Cancellation Note  Patient Details Name: Steve [name] MRN: [id number] DOB: [birth year]   Cancelled Treatment:    Reason Eval/Treat Not Completed: OT screened, no needs identified, will sign off  Steve [name] 12/01/2012, 9:30 AM [phone]

## 2012-12-02 LAB — CBC
HCT: 28.6 % — ABNORMAL LOW (ref 39.0–52.0)
MCHC: 32.9 g/dL (ref 30.0–36.0)
MCV: 84.1 fL (ref 78.0–100.0)
RDW: 14.8 % (ref 11.5–15.5)

## 2012-12-02 LAB — BASIC METABOLIC PANEL
BUN: 14 mg/dL (ref 6–23)
Creatinine, Ser: 0.74 mg/dL (ref 0.50–1.35)
GFR calc Af Amer: >90 mL/min (ref >90)
GFR calc non Af Amer: >90 mL/min (ref >90)
Potassium: 4.5 mEq/L (ref 3.5–5.1)

## 2012-12-02 NOTE — Progress Notes (Signed)
RT placed patient on CPAP. Patient stated that there was too much pressure and asked if I could lower it. I lowered pressure from 8 cmH2O to 6 cmH2O. Patient seems to be tolerating well at this time. Asked patient to let RN know if he has any problems. Will continue to monitor.

## 2012-12-02 NOTE — Progress Notes (Signed)
Physical Therapy Treatment Patient Details Name: Steve [name] MRN: [id number] DOB: 1955-10-21 Today's Date: 12/02/2012 Time: [id number] PT Time Calculation (min): 25 min  PT Assessment / Plan / Recommendation  History of Present Illness     PT Comments   POD # 2 R THReimplantation am session.  Assisted pt OOB to amb in hallway, practiced steps using one rail and one crutch then given HEP.  Pt very knowledgable havinh had multiple surgeries.  Plans to D/C to sisters house.   Follow Up Recommendations  Home health PT     Does the patient have the potential to tolerate intense rehabilitation     Barriers to Discharge        Equipment Recommendations  None recommended by PT    Recommendations for Other Services    Frequency 7X/week   Progress towards PT Goals Progress towards PT goals: Progressing toward goals  Plan      Precautions / Restrictions Precautions Precautions: Posterior Hip;Fall Precaution Comments: Pt recalled 3/3 THP and is aware PWB Restrictions Weight Bearing Restrictions: Yes RLE Weight Bearing: Partial weight bearing RLE Partial Weight Bearing Percentage or Pounds: 25-50%    Pertinent Vitals/Pain C/o 5/10 with act Pre medicated ICE applied    Mobility  Bed Mobility Bed Mobility: Supine to Sit Supine to Sit: 4: Min guard Details for Bed Mobility Assistance: Had pt use a gait belt to assist R LE off bed which he performed with incr time Transfers Transfers: Sit to Stand;Stand to Sit Sit to Stand: 4: Min guard;5: Supervision;From bed Stand to Sit: 4: Min guard;5: Supervision;To chair/3-in-1 Details for Transfer Assistance: increased time and good safety tech along with THP Ambulation/Gait Ambulation/Gait Assistance: 4: Min guard;5: Supervision Ambulation Distance (Feet): 285 Feet Assistive device: Rolling walker Ambulation/Gait Assistance Details: <25% VC's on safety with turns and increased time Gait Pattern: Step-to pattern Gait velocity:  decreased Stairs: Yes Stairs Assistance: 4: Min guard;5: Supervision Stairs Assistance Details (indicate cue type and reason): 25% VC's on proper tech and sequencing using one crutch. Stair Management Technique: With crutches;Forwards;One rail Left    Exercises  10 reps AROM AP 10 reps AROM knee presses    PT Goals (current goals can now be found in the care plan section)    Visit Information  Last PT Received On: 12/02/12 Assistance Needed: +1    Subjective Data      Cognition       Balance     End of Session PT - End of Session Equipment Utilized During Treatment: Gait belt Activity Tolerance: Patient tolerated treatment well Patient left: in chair;with call bell/phone within reach Nurse Communication: Mobility status   Rica Koyanagi  PTA [facility]  Acute  Rehab Pager      [phone]

## 2012-12-02 NOTE — Care Management Note (Signed)
   CARE MANAGEMENT NOTE 12/02/2012  Patient:  Steve [name], Steve [name]   Account Number:  [id number]  Date Initiated:  12/02/2012  Documentation initiated by:  [facility]  Subjective/Objective Assessment:   s/p total hip arthroplasty reimplantation     Action/Plan:   home with sister and [facility] pt services   Anticipated DC Date:  12/02/2012   Anticipated DC Plan:  [place]  In-house referral  NA      DC Planning Services  CM consult      [facility] Choice  HOME HEALTH   Choice offered to / List presented to:  [name]   DME arranged  NA      DME agency  NA     [place] arranged  HH-2 PT      [place].   Status of service:  Completed, signed off Medicare Important Message given?   (If response is "NO", the following Medicare IM given date fields will be blank) Date Medicare IM given:   Date Additional Medicare IM given:    Discharge Disposition:  [place]  Per UR Regulation:    If discussed at Long Length of Stay Meetings, dates discussed:    Comments:  12/02/12 [email or web address] [id number] Received referral for PT services. Spoke witih patient at bedside to offer choice. Reports he has had [facility] in the past and would like them again. Call made to [facility] intake to make referral for PT services and make aware that patient is going to stay with his sister post hospital discharge. Confirmed address with [facility] because patient states [facility] has sister's address-- [address] [place], [place], [place] [address]. [phone] number is [phone]; Faxed referral and fax confirmation received. Of note, patient reports he has DME already. No further needs assessed. Elsie Ra, [place]

## 2012-12-02 NOTE — Progress Notes (Signed)
Pt stable, scripts, d/c instructions given with no questions/concerns voiced by pt or family.  Pt transported via wheelchair to private vehicle by NT and family.

## 2012-12-03 LAB — WOUND CULTURE

## 2012-12-04 ENCOUNTER — Emergency Department (HOSPITAL_COMMUNITY): Payer: BC Managed Care – PPO

## 2012-12-04 ENCOUNTER — Encounter (HOSPITAL_COMMUNITY): Payer: Self-pay | Admitting: Emergency Medicine

## 2012-12-04 ENCOUNTER — Emergency Department (HOSPITAL_COMMUNITY)
Admission: EM | Admit: 2012-12-04 | Discharge: 2012-12-04 | Disposition: A | Discharge status: Home / Self Care | Payer: BC Managed Care – PPO | Attending: Emergency Medicine | Admitting: Emergency Medicine

## 2012-12-04 DIAGNOSIS — Z8739 Personal history of other diseases of the musculoskeletal system and connective tissue: Secondary | ICD-10-CM | POA: Insufficient documentation

## 2012-12-04 DIAGNOSIS — Z872 Personal history of diseases of the skin and subcutaneous tissue: Secondary | ICD-10-CM | POA: Insufficient documentation

## 2012-12-04 DIAGNOSIS — X500XXA Overexertion from strenuous movement or load, initial encounter: Secondary | ICD-10-CM | POA: Insufficient documentation

## 2012-12-04 DIAGNOSIS — Z7901 Long term (current) use of anticoagulants: Secondary | ICD-10-CM | POA: Insufficient documentation

## 2012-12-04 DIAGNOSIS — Z862 Personal history of diseases of the blood and blood-forming organs and certain disorders involving the immune mechanism: Secondary | ICD-10-CM | POA: Insufficient documentation

## 2012-12-04 DIAGNOSIS — G4730 Sleep apnea, unspecified: Secondary | ICD-10-CM | POA: Insufficient documentation

## 2012-12-04 DIAGNOSIS — Y9389 Activity, other specified: Secondary | ICD-10-CM | POA: Insufficient documentation

## 2012-12-04 DIAGNOSIS — Z8776 Personal history of (corrected) congenital malformations of integument, limbs and musculoskeletal system: Secondary | ICD-10-CM | POA: Insufficient documentation

## 2012-12-04 DIAGNOSIS — Z87768 Personal history of other specified (corrected) congenital malformations of integument, limbs and musculoskeletal system: Secondary | ICD-10-CM | POA: Insufficient documentation

## 2012-12-04 DIAGNOSIS — T84029A Dislocation of unspecified internal joint prosthesis, initial encounter: Secondary | ICD-10-CM | POA: Insufficient documentation

## 2012-12-04 DIAGNOSIS — Z96649 Presence of unspecified artificial hip joint: Secondary | ICD-10-CM | POA: Insufficient documentation

## 2012-12-04 DIAGNOSIS — Y92009 Unspecified place in unspecified non-institutional (private) residence as the place of occurrence of the external cause: Secondary | ICD-10-CM | POA: Insufficient documentation

## 2012-12-04 LAB — TYPE AND SCREEN
Antibody Screen: NEGATIVE
Unit division: 0

## 2012-12-04 MED ORDER — ONDANSETRON HCL 4 MG/2ML IJ SOLN: 4.0000 mg | Freq: Once | INTRAMUSCULAR | Status: AC

## 2012-12-04 MED ORDER — MORPHINE SULFATE 4 MG/ML IJ SOLN
4.0000 mg | Freq: Once | INTRAMUSCULAR | Status: AC
  Administered 2012-12-04: 4 mg via INTRAVENOUS
  Filled 2012-12-04: qty 1

## 2012-12-04 MED ORDER — PROPOFOL 10 MG/ML IV BOLUS: 0.5000 mg/kg | Freq: Once | INTRAVENOUS | Status: DC

## 2012-12-04 MED ORDER — ONDANSETRON HCL 4 MG/2ML IJ SOLN
4.0000 mg | Freq: Once | INTRAMUSCULAR | Status: AC
  Administered 2012-12-04: 4 mg via INTRAVENOUS
  Filled 2012-12-04: qty 2

## 2012-12-04 MED ORDER — KETAMINE HCL 10 MG/ML IJ SOLN: 1.0000 mg/kg | Freq: Once | INTRAMUSCULAR | Status: DC

## 2012-12-04 MED ORDER — PROPOFOL 10 MG/ML IV BOLUS
1.0000 mg/kg | Freq: Once | INTRAVENOUS | Status: AC
  Administered 2012-12-04: 87.3 mg via INTRAVENOUS
  Filled 2012-12-04: qty 1

## 2012-12-04 NOTE — ED Notes (Signed)
Bed: WA03 Expected date:  Expected time:  Means of arrival:  Comments: Possible hip dislocation

## 2012-12-04 NOTE — ED Notes (Addendum)
Pt from home.  Had hip replaced on RT on 11/30/12.  Was stepping up into the tub and as he was swinging his RT leg into tub it popped out of place.  EMS gave fentanyl 150 mcg to #18 RAC.  120/80, 89SR, 18, 88%RA after fentanyl then 99% on 2L/[place].  Currently c/o 6/10 pain level. RT leg is slightly shortened and internally rotated.  Pedal pulse is strong.

## 2012-12-04 NOTE — ED Provider Notes (Signed)
CSN: [id number]     Arrival date & time 12/04/12  1445 History   First MD Initiated Contact with Patient 12/04/12 1448     Chief Complaint  Patient presents with  . Hip Pain  . Dislocation   (Consider location/radiation/quality/duration/timing/severity/associated sxs/prior Treatment) HPI This 57 year old male with history of recent hip her husband who presents with right hip pain. He reports having his hip replaced on August 22. He was at home getting out of the bathtub when he lifted his right leg over the tub. He heard a pop and felt pain.  EMS reported an internally rotated and foreshortened right limb with good peripheral pulses. The patient received 50 mcg in route. He currently rates his pain 6/10. Past Medical History  Diagnosis Date  . Abscess of muscle 08/10/2011    staph infection of right hip   . Hip dysplasia, congenital     no surgery as a child for hip dysplasia - has had bilateral hip replacements as an adult  . Postoperative anemia due to acute blood loss 09/07/2012  . GERD (gastroesophageal reflux disease)     rare reflux - no meds for reflux - NO PROBLEM IN PAST SEVERAL YRS  . Septic arthritis of hip 09/05/2012    PT'S TOTAL HIP JOINT REMOVED - ANTIBIOTIC SPACE PLACED AND PT HAS FINISHED IV ANTIBIOTICS ( PICC LINE REMOVED)  . Sleep apnea     USES CPAP   Past Surgical History  Procedure Laterality Date  . Hernia repair      inguinal hernia x3  . Tee without cardioversion  07/29/2011    Procedure: TRANSESOPHAGEAL ECHOCARDIOGRAM (TEE);  Surgeon: Josue Hector, MD;  Location: [facility] ENDOSCOPY;  Service: Cardiovascular;  Laterality: N/A;  . Multiple extractions with alveoloplasty  07/28/2011    Procedure: MULTIPLE EXTRACION WITH ALVEOLOPLASTY;  Surgeon: Lenn Cal, DDS;  Location: [facility] ORS;  Service: Oral Surgery;  Laterality: N/A;  Extraction of tooth #'s 2,3,4,5,6,11,12,13,15,19,22 with alveoloplasty.  . Shoulder repair - right for separation of shoulder    . Total hip  revision Right 09/05/2012    Procedure: RIGHT HIP RESECTION ARTHROPLASTY WITH ANTIBIOTIC SPACERS;  Surgeon: [name], MD;  Location: [facility] ORS;  Service: Orthopedics;  Laterality: Right;  . Joint replacement  2002 & 2007    bilateral hip replacement   Family History  Problem Relation Age of Onset  . Melanoma Mother   . Heart attack Mother   . Hyperlipidemia Neg Hx   . Sudden death Neg Hx   . Hypertension Neg Hx   . Diabetes Neg Hx    History  Substance Use Topics  . Smoking status: Never Smoker   . Smokeless tobacco: Never Used  . Alcohol Use: 0.0 oz/week     Comment: rare beer    Review of Systems  Constitutional: Negative.  Negative for fever.  Respiratory: Negative.  Negative for chest tightness and shortness of breath.   Cardiovascular: Negative.  Negative for chest pain.  Gastrointestinal: Negative.  Negative for abdominal pain.  Genitourinary: Negative.   Musculoskeletal: Positive for joint swelling.       Right hip pain and swelling.  Skin: Negative for rash.  Neurological: Negative for headaches.  All other systems reviewed and are negative.    Allergies  Review of patient's allergies indicates no known allergies.  Home Medications   Current Outpatient Rx  Name  Route  Sig  Dispense  Refill  . methocarbamol (ROBAXIN) 500 MG tablet   Oral  Take 1 tablet (500 mg total) by mouth every 6 (six) hours as needed.   60 tablet   0   . oxyCODONE (OXY IR/ROXICODONE) 5 MG immediate release tablet   Oral   Take 1-4 tablets (5-20 mg total) by mouth every 3 (three) hours as needed.   80 tablet   0   . rivaroxaban (XARELTO) 10 MG TABS tablet   Oral   Take 1 tablet (10 mg total) by mouth daily with breakfast.   19 tablet   0   . traMADol (ULTRAM) 50 MG tablet   Oral   Take 1-2 tablets (50-100 mg total) by mouth every 6 (six) hours as needed.   60 tablet   0    BP 118/67  Pulse 78  Temp(Src) 98.5 F (36.9 C) (Oral)  Resp 20  Wt 192 lb 7.4 oz (87.3  kg)  BMI 31.08 kg/m2  SpO2 98% Physical Exam  Nursing note and vitals reviewed. Constitutional: He is oriented to person, place, and time. He appears well-developed and well-nourished. No distress.  HENT:  Head: Normocephalic and atraumatic.  Cardiovascular: Normal rate, regular rhythm and normal heart sounds.   No murmur heard. Pulmonary/Chest: Effort normal and breath sounds normal. No respiratory distress. He has no wheezes.  Abdominal: Soft. Bowel sounds are normal. There is no tenderness. There is no rebound.  Musculoskeletal: He exhibits no edema.  Tenderness to palpation of the right hip, right lower ext is foreshortened and internally rotated. Good DP pulse.  Lymphadenopathy:    He has no cervical adenopathy.  Neurological: He is alert and oriented to person, place, and time.  Skin: Skin is warm and dry.  Psychiatric: He has a normal mood and affect.    ED Course  Procedural sedation Date/Time: 12/04/2012 6:00 PM Performed by: Thayer Jew, F Authorized by: Thayer Jew, F Consent: Verbal consent obtained. written consent not obtained. Risks and benefits: risks, benefits and alternatives were discussed Consent given by: patient Patient understanding: patient states understanding of the procedure being performed Patient consent: the patient's understanding of the procedure matches consent given Procedure consent: procedure consent matches procedure scheduled Imaging studies: imaging studies available Patient identity confirmed: verbally with patient Time out: Immediately prior to procedure a "time out" was called to verify the correct patient, procedure, equipment, support staff and site/side marked as required. Patient sedated: yes Sedation type: moderate (conscious) sedation Sedatives: propofol Sedation start date/time: 12/04/2012 5:15 PM Sedation end date/time: 12/04/2012 6:30 PM Vitals: Vital signs were monitored during sedation. Patient tolerance: Patient  tolerated the procedure well with no immediate complications.   (including critical care time) Labs Review Labs Reviewed - No data to display Imaging Review Dg Hip Complete Right  12/04/2012   *RADIOLOGY REPORT*  Clinical Data: Postreduction  RIGHT HIP - COMPLETE 2+ VIEW  Comparison: 12/04/2012  Findings: Two views of the right hip submitted.  Postreduction right hip prosthesis in anatomic alignment.  Again noted healing fracture intertrochanteric region of  the right femur. Postsurgical changes are noted with lateral skin staples.  IMPRESSION:  Postreduction right hip prosthesis in anatomic alignment.  Again noted healing fracture intertrochanteric region of  the right femur.  Postsurgical changes are noted with lateral skin staples.   Original Report Authenticated By: Lahoma Crocker, M.D.   Dg Hip Complete Right  12/04/2012   *RADIOLOGY REPORT*  Clinical Data: Right hip dislocation  RIGHT HIP - COMPLETE 2+ VIEW  Comparison: 10/21/2012  Findings: The right hip arthroplasty is dislocated superolaterally and  slightly posteriorly relative to the right acetabulum.  Again seen is a fracture line through the intertrochanteric region of the right femur, lateral to the hip arthroplasty.  There is some healing with callous formation, similar in appearance to radiographs of 11/08/2012.  No acute pelvic ring fracture is identified.  The proximal left hip arthroplasty appears stable.  Skin staples are seen along the lateral aspect of the proximal right lower extremity.  IMPRESSION: 1.  The right hip arthroplasty is superolaterally and slightly posteriorly dislocated. 2.  Again noted is a healing fracture of the intertrochanteric region of the right femur. 3.  Left hip arthroplasty is unremarkable.   Original Report Authenticated By: Curlene Dolphin, M.D.    MDM   1. Dislocation of hip prosthesis, initial encounter    This is a 57 year old male who presents with dislocation of the right hip. He is nontoxic-appearing on  exam. X-ray confirms dislocation of recent right hip arthroplasty. Orthopedics was called for reduction. Patient underwent conscious sedation for reduction. He tolerated the procedure well. Postreduction films were obtained and show reduced right hip. Patient was placed in a knee immobilizer. He will be discharged home with touchdown weightbearing. He will followup with orthopedics.  After history, exam, and medical workup I feel the patient has been appropriately medically screened and is safe for discharge home. Pertinent diagnoses were discussed with the patient. Patient was given return precautions.   Merryl Hacker, MD 12/04/12 [phone]

## 2012-12-04 NOTE — Consult Note (Signed)
Reason for Consul  Steve [name] is an 57 y.o. male.  HPI: Pt is a pleasant %& yo old male just recently undeerwent reimplntation THA for infected Rt THR on FRiday. He was sent home on Sunday. Today he moved Rt LE and felt pop on hip . Transported to [facility] ED and found to have dislocated Rt THR. I was consulted.Dr Steve [name] asked me to reduce and send home in knee immob.   I discussed the plan with the patient and he agrees and accepts risk versus benefits.  Past Medical History  Diagnosis Date  . Abscess of muscle 08/10/2011    staph infection of right hip   . Hip dysplasia, congenital     no surgery as a child for hip dysplasia - has had bilateral hip replacements as an adult  . Postoperative anemia due to acute blood loss 09/07/2012  . GERD (gastroesophageal reflux disease)     rare reflux - no meds for reflux - NO PROBLEM IN PAST SEVERAL YRS  . Septic arthritis of hip 09/05/2012    PT'S TOTAL HIP JOINT REMOVED - ANTIBIOTIC SPACE PLACED AND PT HAS FINISHED IV ANTIBIOTICS ( PICC LINE REMOVED)  . Sleep apnea     USES CPAP    Past Surgical History  Procedure Laterality Date  . Hernia repair      inguinal hernia x3  . Tee without cardioversion  07/29/2011    Procedure: TRANSESOPHAGEAL ECHOCARDIOGRAM (TEE);  Surgeon: Steve Hector, MD;  Location: [facility] ENDOSCOPY;  Service: Cardiovascular;  Laterality: N/A;  . Multiple extractions with alveoloplasty  07/28/2011    Procedure: MULTIPLE EXTRACION WITH ALVEOLOPLASTY;  Surgeon: Steve [name], DDS;  Location: [facility] ORS;  Service: Oral Surgery;  Laterality: N/A;  Extraction of tooth #'s 2,3,4,5,6,11,12,13,15,19,22 with alveoloplasty.  . Shoulder repair - right for separation of shoulder    . Total hip revision Right 09/05/2012    Procedure: RIGHT HIP RESECTION ARTHROPLASTY WITH ANTIBIOTIC SPACERS;  Surgeon: Steve Alf, MD;  Location: [facility] ORS;  Service: Orthopedics;  Laterality: Right;  . Joint replacement  2002 & 2007    bilateral hip  replacement    Family History  Problem Relation Age of Onset  . Melanoma Mother   . Heart attack Mother   . Hyperlipidemia Neg Hx   . Sudden death Neg Hx   . Hypertension Neg Hx   . Diabetes Neg Hx     Social History:  reports that he has never smoked. He has never used smokeless tobacco. He reports that  drinks alcohol. He reports that he does not use illicit drugs.  Allergies: No Known Allergies  Medications: I have reviewed the patient's current medications.  No results found for this or any previous visit (from the past 48 hour(s)).  Dg Hip Complete Right  12/04/2012   *RADIOLOGY REPORT*  Clinical Data: Right hip dislocation  RIGHT HIP - COMPLETE 2+ VIEW  Comparison: 10/21/2012  Findings: The right hip arthroplasty is dislocated superolaterally and slightly posteriorly relative to the right acetabulum.  Again seen is a fracture line through the intertrochanteric region of the right femur, lateral to the hip arthroplasty.  There is some healing with callous formation, similar in appearance to radiographs of 11/08/2012.  No acute pelvic ring fracture is identified.  The proximal left hip arthroplasty appears stable.  Skin staples are seen along the lateral aspect of the proximal right lower extremity.  IMPRESSION: 1.  The right hip arthroplasty is superolaterally and slightly posteriorly  dislocated. 2.  Again noted is a healing fracture of the intertrochanteric region of the right femur. 3.  Left hip arthroplasty is unremarkable.   Original Report Authenticated By: Steve [name], M.D.    ROS Blood pressure 138/89, pulse 84, temperature 98.5 F (36.9 C), temperature source Oral, resp. rate 18, weight 87.3 kg (192 lb 7.4 oz), SpO2 100.00%. Physical Examawake and alert  Oby 4. Right hip swollen mild serosang drainage. LE short and ER pulses 4+ NV grossl;y intact  Assessment/Plan: Dislocated right THR   Plan closed reduction.  Steve [name] Steve [name] 12/04/2012, 5:00 PM

## 2012-12-04 NOTE — Procedures (Signed)
Procedure note Diagnosis dislocated right total hip arthroplasty Procedure closed reduction examination under anesthesia of dislocated right total hip arthroplasty Conscious sedation per Dr. Dina Rich Procedure dislocation reduction of total hip arthroplasty Dr. Hart Robinsons Surgeon Hart Robinsons M.D. Procedure details Patient was evaluated in the emergency room I discussed the risk and benefits of the procedure including inability to reduce potential complications to hardware and/or soft tissue and/or bone and/or neurovascular structures. He except on his wrist he wished to proceed with conscious sedation close reduction examination in the emergency department. Following sign of of informed consent and operative at. Patient was placed under conscious sedation per Dr. Dina Rich. Following adequate conscious sedation. Patient underwent moderate resistance longitudinal traction flexion gentle internal or external rotation with a palpable and audible clunking sensation. Following this he had better alignment and rotation. X-ray confirmed anatomic reduction of the dislocation. No complicating features. I was concerned about the position of the greater trochanter Dr. a loose show pull the x-rays up in the office and reviewed these and felt that it was satisfactory and it was in the same position he had left at the time of surgery due to the previous history of fracture there. Mild patient was placed into a knee immobilizer. After gaining consciousness he had normal He'll be discharged home at the recommendations of Dr. Reynaldo Minium weightbearing as tolerated to use a knee immobilizer at all times. Walker assisted ambulation. He'll follow Dr. Renato Shin is regular scheduled appointment. It had the same medications he was discharged home on. Recommended to ice the hip today and tomorrow. He'll call the office if it's has a questions or problems. Jonn Shingles M.D.

## 2012-12-05 ENCOUNTER — Encounter (HOSPITAL_COMMUNITY): Payer: Self-pay | Admitting: Orthopedic Surgery

## 2012-12-05 LAB — ANAEROBIC CULTURE

## 2012-12-20 NOTE — Discharge Summary (Signed)
Physician Discharge Summary   Patient ID: Steve [name] MRN: [id number] DOB/AGE: December 10, 1955 57 y.o.  Admit date: 11/30/2012 Discharge date: 12/02/2012  Primary Diagnosis:  Resection of arthroplasty right hip secondary  to infection.  Admission Diagnoses:  Past Medical History  Diagnosis Date  . Abscess of muscle 08/10/2011    staph infection of right hip   . Hip dysplasia, congenital     no surgery as a child for hip dysplasia - has had bilateral hip replacements as an adult  . Postoperative anemia due to acute blood loss 09/07/2012  . GERD (gastroesophageal reflux disease)     rare reflux - no meds for reflux - NO PROBLEM IN PAST SEVERAL YRS  . Septic arthritis of hip 09/05/2012    PT'S TOTAL HIP JOINT REMOVED - ANTIBIOTIC SPACE PLACED AND PT HAS FINISHED IV ANTIBIOTICS ( PICC LINE REMOVED)  . Sleep apnea     USES CPAP   Discharge Diagnoses:   Active Problems:   * No active hospital problems. *  Estimated body mass index is 31.07 kg/(m^2) as calculated from the following:   Height as of this encounter: 5\' 6"  (1.676 m).   Weight as of this encounter: 87.272 kg (192 lb 6.4 oz).  Procedure(s) (LRB): RIGHT TOTAL HIP ARTHROPLASTY REIMPLANTATION (Right)   Consults: None  HPI: Steve [name] is a 57 year old male who had a total hip  arthroplasty many years ago, did fine, and then unfortunately developed  a significant dental infection which subsequently led to seating of his  hip with a right hip infection. We had to perform a resection  arthroplasty antibiotic spacer approximately 3 months ago. His lab work  has returned to normal, and there were no signs of infection. He  presents now for bilateral total hip arthroplasty reimplantation.  Laboratory Data: Admission on 11/30/2012, Discharged on 12/02/2012  Component Date Value Range Status  . ABO/RH(D) 11/30/2012 A NEG   Final  . Antibody Screen 11/30/2012 NEG   Final  . Sample Expiration 11/30/2012 12/03/2012   Final  . Unit  Number 11/30/2012 [id number]   Final  . Blood Component Type 11/30/2012 RED CELLS,LR   Final  . Unit division 11/30/2012 00   Final  . Status of Unit 11/30/2012 REL FROM [facility]   Final  . Transfusion Status 11/30/2012 OK TO TRANSFUSE   Final  . Crossmatch Result 11/30/2012 Compatible   Final  . Unit Number 11/30/2012 [id number]   Final  . Blood Component Type 11/30/2012 RED CELLS,LR   Final  . Unit division 11/30/2012 00   Final  . Status of Unit 11/30/2012 REL FROM [facility]   Final  . Transfusion Status 11/30/2012 OK TO TRANSFUSE   Final  . Crossmatch Result 11/30/2012 Compatible   Final  . Order Confirmation 11/30/2012 ORDER PROCESSED BY BLOOD BANK   Final  . Specimen Description 11/30/2012 HIP   Final  . Special Requests 11/30/2012 WOUND   Final  . Gram Stain 11/30/2012    Final                   Value:NO ORGANISMS SEEN                         RARE WBC PRESENT, PREDOMINANTLY MONONUCLEAR                         Gram Stain Report Called to,Read Back By and Verified With: SHUMAS,T. AT 1916 ON  08.22.14 BY LOVE,T.  . Report Status 11/30/2012 11/30/2012 FINAL   Final  . Specimen Description 11/30/2012 LEG RIGHT   Final  . Special Requests 11/30/2012 PATIENT ON FOLLOWING ANCEF   Final  . Gram Stain 11/30/2012    Final                   Value:RARE WBC PRESENT, PREDOMINANTLY MONONUCLEAR                         NO ORGANISMS SEEN                         Performed by [facility]                         Performed at [facility]  . Culture 11/30/2012    Final                   Value:NO ANAEROBES ISOLATED                         Performed at [facility]  . Report Status 11/30/2012 12/05/2012 FINAL   Final  . Specimen Description 11/30/2012 LEG RIGHT   Final  . Special Requests 11/30/2012 PATIENT ON FOLLOWING ANCEF   Final  . Gram Stain 11/30/2012    Final                   Value:RARE WBC PRESENT, PREDOMINANTLY MONONUCLEAR                         NO SQUAMOUS  EPITHELIAL CELLS SEEN                         NO ORGANISMS SEEN                         Performed by [facility]                         Performed at [facility]  . Culture 11/30/2012    Final                   Value:NO GROWTH 2 DAYS                         Performed at [facility]  . Report Status 11/30/2012 12/03/2012 FINAL   Final  . WBC 11/30/2012 14.3* 4.0 - 10.5 K/uL Final  . RBC 11/30/2012 3.55* 4.22 - 5.81 MIL/uL Final  . Hemoglobin 11/30/2012 9.9* 13.0 - 17.0 g/dL Final  . HCT 11/30/2012 29.9* 39.0 - 52.0 % Final  . MCV 11/30/2012 84.2  78.0 - 100.0 fL Final  . MCH 11/30/2012 27.9  26.0 - 34.0 pg Final  . MCHC 11/30/2012 33.1  30.0 - 36.0 g/dL Final  . RDW 11/30/2012 14.3  11.5 - 15.5 % Final  . Platelets 11/30/2012 193  150 - 400 K/uL Final  . WBC 12/01/2012 11.7* 4.0 - 10.5 K/uL Final  . RBC 12/01/2012 3.90* 4.22 - 5.81 MIL/uL Final  . Hemoglobin 12/01/2012 10.6* 13.0 - 17.0 g/dL Final  . HCT 12/01/2012 32.7* 39.0 - 52.0 % Final  . MCV 12/01/2012 83.8  78.0 -  100.0 fL Final  . MCH 12/01/2012 27.2  26.0 - 34.0 pg Final  . MCHC 12/01/2012 32.4  30.0 - 36.0 g/dL Final  . RDW 12/01/2012 14.5  11.5 - 15.5 % Final  . Platelets 12/01/2012 262  150 - 400 K/uL Final   Comment: DELTA CHECK NOTED                          REPEATED TO VERIFY  . Sodium 12/01/2012 133* 135 - 145 mEq/L Final  . Potassium 12/01/2012 4.1  3.5 - 5.1 mEq/L Final  . Chloride 12/01/2012 100  96 - 112 mEq/L Final  . CO2 12/01/2012 23  19 - 32 mEq/L Final  . Glucose, Bld 12/01/2012 204* 70 - 99 mg/dL Final  . BUN 12/01/2012 11  6 - 23 mg/dL Final  . Creatinine, Ser 12/01/2012 0.73  0.50 - 1.35 mg/dL Final  . Calcium 12/01/2012 8.4  8.4 - 10.5 mg/dL Final  . GFR calc non Af Amer 12/01/2012 >90  >90 mL/min Final  . GFR calc Af Amer 12/01/2012 >90  >90 mL/min Final   Comment: (NOTE)                          The eGFR has been calculated using the CKD EPI equation.                           This calculation has not been validated in all clinical situations.                          eGFR's persistently <90 mL/min signify possible Chronic Kidney                          Disease.  . WBC 12/02/2012 13.3* 4.0 - 10.5 K/uL Final  . RBC 12/02/2012 3.40* 4.22 - 5.81 MIL/uL Final  . Hemoglobin 12/02/2012 9.4* 13.0 - 17.0 g/dL Final  . HCT 12/02/2012 28.6* 39.0 - 52.0 % Final  . MCV 12/02/2012 84.1  78.0 - 100.0 fL Final  . MCH 12/02/2012 27.6  26.0 - 34.0 pg Final  . MCHC 12/02/2012 32.9  30.0 - 36.0 g/dL Final  . RDW 12/02/2012 14.8  11.5 - 15.5 % Final  . Platelets 12/02/2012 238  150 - 400 K/uL Final  . Sodium 12/02/2012 136  135 - 145 mEq/L Final  . Potassium 12/02/2012 4.5  3.5 - 5.1 mEq/L Final  . Chloride 12/02/2012 102  96 - 112 mEq/L Final  . CO2 12/02/2012 28  19 - 32 mEq/L Final  . Glucose, Bld 12/02/2012 154* 70 - 99 mg/dL Final  . BUN 12/02/2012 14  6 - 23 mg/dL Final  . Creatinine, Ser 12/02/2012 0.74  0.50 - 1.35 mg/dL Final  . Calcium 12/02/2012 8.6  8.4 - 10.5 mg/dL Final  . GFR calc non Af Amer 12/02/2012 >90  >90 mL/min Final  . GFR calc Af Amer 12/02/2012 >90  >90 mL/min Final   Comment: (NOTE)                          The eGFR has been calculated using the CKD EPI equation.  This calculation has not been validated in all clinical situations.                          eGFR's persistently <90 mL/min signify possible Chronic Kidney                          Disease.  Hospital Outpatient Visit on 11/08/2012  Component Date Value Range Status  . MRSA, PCR 11/08/2012 NEGATIVE  NEGATIVE Final  . Staphylococcus aureus 11/08/2012 POSITIVE* NEGATIVE Final   Comment:                                 The Xpert SA Assay (FDA                          approved for NASAL specimens                          in patients over 91 years of age),                          is one component of                          a comprehensive surveillance                           program.  Test performance has                          been validated by [employer] for patients greater                          than or equal to 29 year old.                          It is not intended                          to diagnose infection nor to                          guide or monitor treatment.  Marland Kitchen aPTT 11/08/2012 32  24 - 37 seconds Final  . WBC 11/08/2012 8.9  4.0 - 10.5 K/uL Final  . RBC 11/08/2012 4.85  4.22 - 5.81 MIL/uL Final  . Hemoglobin 11/08/2012 13.3  13.0 - 17.0 g/dL Final  . HCT 11/08/2012 41.2  39.0 - 52.0 % Final  . MCV 11/08/2012 84.9  78.0 - 100.0 fL Final  . MCH 11/08/2012 27.4  26.0 - 34.0 pg Final  . MCHC 11/08/2012 32.3  30.0 - 36.0 g/dL Final  . RDW 11/08/2012 14.6  11.5 - 15.5 % Final  . Platelets 11/08/2012 230  150 - 400 K/uL Final  . Sodium 11/08/2012 141  135 - 145 mEq/L Final  . Potassium 11/08/2012 4.3  3.5 - 5.1 mEq/L Final  . Chloride  11/08/2012 103  96 - 112 mEq/L Final  . CO2 11/08/2012 30  19 - 32 mEq/L Final  . Glucose, Bld 11/08/2012 87  70 - 99 mg/dL Final  . BUN 11/08/2012 13  6 - 23 mg/dL Final  . Creatinine, Ser 11/08/2012 0.83  0.50 - 1.35 mg/dL Final  . Calcium 11/08/2012 9.4  8.4 - 10.5 mg/dL Final  . Total Protein 11/08/2012 7.2  6.0 - 8.3 g/dL Final  . Albumin 11/08/2012 3.6  3.5 - 5.2 g/dL Final  . AST 11/08/2012 18  0 - 37 U/L Final  . ALT 11/08/2012 12  0 - 53 U/L Final  . Alkaline Phosphatase 11/08/2012 92  39 - 117 U/L Final  . Total Bilirubin 11/08/2012 0.5  0.3 - 1.2 mg/dL Final  . GFR calc non Af Amer 11/08/2012 >90  >90 mL/min Final  . GFR calc Af Amer 11/08/2012 >90  >90 mL/min Final   Comment:                                 The eGFR has been calculated                          using the CKD EPI equation.                          This calculation has not been                          validated in all clinical                          situations.                           eGFR's persistently                          <90 mL/min signify                          possible Chronic Kidney Disease.  Marland Kitchen Prothrombin Time 11/08/2012 12.8  11.6 - 15.2 seconds Final  . INR 11/08/2012 0.98  0.00 - 1.49 Final  . Color, Urine 11/08/2012 YELLOW  YELLOW Final  . APPearance 11/08/2012 CLEAR  CLEAR Final  . Specific Gravity, Urine 11/08/2012 1.025  1.005 - 1.030 Final  . pH 11/08/2012 6.0  5.0 - 8.0 Final  . Glucose, UA 11/08/2012 NEGATIVE  NEGATIVE mg/dL Final  . Hgb urine dipstick 11/08/2012 NEGATIVE  NEGATIVE Final  . Bilirubin Urine 11/08/2012 NEGATIVE  NEGATIVE Final  . Ketones, ur 11/08/2012 NEGATIVE  NEGATIVE mg/dL Final  . Protein, ur 11/08/2012 NEGATIVE  NEGATIVE mg/dL Final  . Urobilinogen, UA 11/08/2012 0.2  0.0 - 1.0 mg/dL Final  . Nitrite 11/08/2012 NEGATIVE  NEGATIVE Final  . Leukocytes, UA 11/08/2012 NEGATIVE  NEGATIVE Final   MICROSCOPIC NOT DONE ON URINES WITH NEGATIVE PROTEIN, BLOOD, LEUKOCYTES, NITRITE, OR GLUCOSE <1000 mg/dL.     X-Rays:Dg Hip Complete Right  12/04/2012   *RADIOLOGY REPORT*  Clinical Data: Postreduction  RIGHT HIP - COMPLETE 2+ VIEW  Comparison: 12/04/2012  Findings: Two views of the right hip submitted.  Postreduction right hip prosthesis in anatomic alignment.  Again noted healing fracture intertrochanteric region of  the right femur. Postsurgical changes are noted with lateral skin staples.  IMPRESSION:  Postreduction right hip prosthesis in anatomic alignment.  Again noted healing fracture intertrochanteric region of  the right femur.  Postsurgical changes are noted with lateral skin staples.   Original Report Authenticated By: Lahoma Crocker, M.D.   Dg Hip Complete Right  12/04/2012   *RADIOLOGY REPORT*  Clinical Data: Right hip dislocation  RIGHT HIP - COMPLETE 2+ VIEW  Comparison: 10/21/2012  Findings: The right hip arthroplasty is dislocated superolaterally and slightly posteriorly relative to the right acetabulum.  Again seen is a  fracture line through the intertrochanteric region of the right femur, lateral to the hip arthroplasty.  There is some healing with callous formation, similar in appearance to radiographs of 11/08/2012.  No acute pelvic ring fracture is identified.  The proximal left hip arthroplasty appears stable.  Skin staples are seen along the lateral aspect of the proximal right lower extremity.  IMPRESSION: 1.  The right hip arthroplasty is superolaterally and slightly posteriorly dislocated. 2.  Again noted is a healing fracture of the intertrochanteric region of the right femur. 3.  Left hip arthroplasty is unremarkable.   Original Report Authenticated By: Curlene Dolphin, M.D.   Dg Pelvis Portable  11/30/2012   *RADIOLOGY REPORT*  Clinical Data: Postop revision of right hip arthroplasty.  PORTABLE AP PELVIS  Comparison: In the pelvis x-ray 09/05/2012.  Findings: Right total hip arthroplasty revision with anatomic alignment and no visible complicating features.  Interval healing of the comminuted fractures involving the proximal right femur with callus formation.  Surgical drain in place.  The previously placed left hip arthroplasty also demonstrates anatomic alignment.  IMPRESSION: Anatomic alignment post right hip arthroplasty revision without acute complicating features.  Interval healing of the comminuted fractures involving the proximal right femur since May, 2014.   Original Report Authenticated By: Evangeline Dakin, M.D.    EKG: Orders placed during the hospital encounter of 09/05/12  . EKG 12-LEAD  . EKG 12-LEAD  . EKG     Hospital Course: Patient was admitted to [facility] and taken to the OR and underwent the above state procedure without complications.  Patient tolerated the procedure well and was later transferred to the recovery room and then to the orthopaedic floor for postoperative care.  They were given PO and IV analgesics for pain control following their surgery.  They were given 24  hours of postoperative antibiotics of  Anti-infectives   Start     Dose/Rate Route Frequency Ordered Stop   11/30/12 2300  ceFAZolin (ANCEF) IVPB 1 g/50 mL premix     1 g 100 mL/hr over 30 Minutes Intravenous Every 6 hours 11/30/12 2139 12/01/12 0549   11/30/12 0745  ceFAZolin (ANCEF) IVPB 2 g/50 mL premix     2 g 100 mL/hr over 30 Minutes Intravenous On call to O.R. 11/30/12 [id number] 11/30/12 1630     and started on DVT prophylaxis in the form of Xarelto.   PT and OT were ordered for total hip protocol.  The patient was allowed to be WBAT with therapy. Discharge planning was consulted to help with postop disposition and equipment needs.  Patient had a rough night on the evening of surgery with moderate pain.  They started to get up OOB with therapy on day one.  Hemovac drain was pulled without difficulty.  The knee immobilizer was removed and discontinued.  Continued to work with therapy into  day two.  Dressing was changed on day two and the incision was healing well.  Patient was seen in rounds on day two and was ready to go home.  Therapy worked with him and he was walking over 250 feet.   Discharge Medications: Prior to Admission medications   Medication Sig Start Date End Date Taking? Authorizing Provider  methocarbamol (ROBAXIN) 500 MG tablet Take 1 tablet (500 mg total) by mouth every 6 (six) hours as needed. 11/30/12   Amber Renelda Loma, PA-C  oxyCODONE (OXY IR/ROXICODONE) 5 MG immediate release tablet Take 1-4 tablets (5-20 mg total) by mouth every 3 (three) hours as needed. 11/30/12   Amber Renelda Loma, PA-C  rivaroxaban (XARELTO) 10 MG TABS tablet Take 1 tablet (10 mg total) by mouth daily with breakfast. 12/01/12   Amber Renelda Loma, PA-C  traMADol (ULTRAM) 50 MG tablet Take 1-2 tablets (50-100 mg total) by mouth every 6 (six) hours as needed. 11/30/12   Amber Renelda Loma, PA-C    Diet: Regular diet Activity:PWB 50% No bending hip over 90 degrees- A "L" Angle Do not  cross legs Do not let foot roll inward When turning these patients a pillow should be placed between the patient's legs to prevent crossing. Patients should have the affected knee fully extended when trying to sit or stand from all surfaces to prevent excessive hip flexion. When ambulating and turning toward the affected side the affected leg should have the toes turned out prior to moving the walker and the rest of patient's body as to prevent internal rotation/ turning in of the leg. Abduction pillows are the most effective way to prevent a patient from not crossing legs or turning toes in at rest. If an abduction pillow is not ordered placing a regular pillow length wise between the patient's legs is also an effective reminder. It is imperative that these precautions be maintained so that the surgical hip does not dislocate. Follow-up:in 2 weeks Disposition - Home Discharged Condition: stable        Discharge Orders   Future Orders Complete By Expires   Call MD / Call 911  As directed    Comments:     If you experience chest pain or shortness of breath, CALL 911 and be transported to the hospital emergency room.  If you develope a fever above 101 F, pus (white drainage) or increased drainage or redness at the wound, or calf pain, call your surgeon's office.   Call MD / Call 911  As directed    Comments:     If you experience chest pain or shortness of breath, CALL 911 and be transported to the hospital emergency room.  If you develope a fever above 101 F, pus (white drainage) or increased drainage or redness at the wound, or calf pain, call your surgeon's office.   Change dressing  As directed    Comments:     You may change your dressing daily with sterile 4 x 4 inch gauze dressing and paper tape.   Constipation Prevention  As directed    Comments:     Drink plenty of fluids.  Prune juice may be helpful.  You may use a stool softener, such as Colace (over the counter) 100 mg twice a  day.  Use MiraLax (over the counter) for constipation as needed.   Constipation Prevention  As directed    Comments:     Drink plenty of fluids.  Prune juice may be helpful.  You may  use a stool softener, such as Colace (over the counter) 100 mg twice a day.  Use MiraLax (over the counter) for constipation as needed.   Diet - low sodium heart healthy  As directed    Discharge instructions  As directed    Comments:     Partial weightbearing 50% right LE Walk with walker Change dressing daily Shower only, no tub bath Follow up in office in 2 weeks   Follow the hip precautions as taught in Physical Therapy  As directed    Increase activity slowly as tolerated  As directed    Increase activity slowly as tolerated  As directed        Medication List    STOP taking these medications       aspirin 81 MG tablet      TAKE these medications       methocarbamol 500 MG tablet  Commonly known as:  ROBAXIN  Take 1 tablet (500 mg total) by mouth every 6 (six) hours as needed.     oxyCODONE 5 MG immediate release tablet  Commonly known as:  Oxy IR/ROXICODONE  Take 1-4 tablets (5-20 mg total) by mouth every 3 (three) hours as needed.     rivaroxaban 10 MG Tabs tablet  Commonly known as:  XARELTO  Take 1 tablet (10 mg total) by mouth daily with breakfast.     traMADol 50 MG tablet  Commonly known as:  ULTRAM  Take 1-2 tablets (50-100 mg total) by mouth every 6 (six) hours as needed.       Follow-up Information   Follow up with [name], MD. Schedule an appointment as soon as possible for a visit on 12/13/2012. (Call [phone] Monday to make the appointment)    Specialty:  Orthopedic Surgery   Contact information:   [address] [place] [address] [phone]       Follow up with [facility] PT services)    Contact information:   [address] [place] [address] [phone]       Signed: Mickel Crow 12/20/2012, 9:54 AM

## 2012-12-31 ENCOUNTER — Ambulatory Visit (INDEPENDENT_AMBULATORY_CARE_PROVIDER_SITE_OTHER): Payer: BC Managed Care – PPO | Admitting: Internal Medicine

## 2012-12-31 ENCOUNTER — Encounter: Payer: Self-pay | Admitting: Internal Medicine

## 2012-12-31 VITALS — BP 127/83 | HR 90 | Temp 98.1°F | Wt 187.0 lb

## 2012-12-31 DIAGNOSIS — R109 Unspecified abdominal pain: Secondary | ICD-10-CM

## 2012-12-31 NOTE — Patient Instructions (Signed)
Gradually go back to a normal diet, drink plenty of clear fluids. Call or go to the ER if you've worse, you have not improving in next 48 hours, you have fever, chills, nausea, blood in the stools.

## 2012-12-31 NOTE — Assessment & Plan Note (Addendum)
2 days history of abdominal pain, DDX is large but includes early appendicitis, early diverticulitis, gallbladder stone. Urinalysis showed ketones but no nitrates or white cells. Since the patient is improving I think is reasonable to do blood work and observe x 2 days.  Clearly stated to the patient that if he is not improving or if he is worse he needs to go to the ER or call. He is in agreement. See instructions.

## 2012-12-31 NOTE — Progress Notes (Signed)
  Subjective:    Patient ID: Steve [name], male    DOB: 07/20/1955, 57 y.o.   MRN: [id number]  HPI Acute visit 57 year old gentleman presents with 2 days history of abdominal pain. Initially was epigastric, steady, no radiation, developed immediately after he ate a high fiber meal. Since the onset of her symptoms, now the pain is less intense andt has moved to the mid lower abdomen. The pain increases when he eats. When asked, reports he did take some aleve  and 2 motrin before the onset of his symptoms . Appetite remains good.  Past Medical History  Diagnosis Date  . Abscess of muscle 08/10/2011    staph infection of right hip   . Hip dysplasia, congenital     no surgery as a child for hip dysplasia - has had bilateral hip replacements as an adult  . Postoperative anemia due to acute blood loss 09/07/2012  . GERD (gastroesophageal reflux disease)     rare reflux - no meds for reflux - NO PROBLEM IN PAST SEVERAL YRS  . Septic arthritis of hip 09/05/2012    PT'S TOTAL HIP JOINT REMOVED - ANTIBIOTIC SPACE PLACED AND PT HAS FINISHED IV ANTIBIOTICS ( PICC LINE REMOVED)  . Sleep apnea     USES CPAP   Past Surgical History  Procedure Laterality Date  . Hernia repair      inguinal hernia x3  . Tee without cardioversion  07/29/2011    Procedure: TRANSESOPHAGEAL ECHOCARDIOGRAM (TEE);  Surgeon: Josue Hector, MD;  Location: [facility] ENDOSCOPY;  Service: Cardiovascular;  Laterality: N/A;  . Multiple extractions with alveoloplasty  07/28/2011    Procedure: MULTIPLE EXTRACION WITH ALVEOLOPLASTY;  Surgeon: Lenn Cal, DDS;  Location: [facility] ORS;  Service: Oral Surgery;  Laterality: N/A;  Extraction of tooth #'s 2,3,4,5,6,11,12,13,15,19,22 with alveoloplasty.  . Shoulder repair - right for separation of shoulder    . Total hip revision Right 09/05/2012    Procedure: RIGHT HIP RESECTION ARTHROPLASTY WITH ANTIBIOTIC SPACERS;  Surgeon: [name], MD;  Location: [facility] ORS;  Service: Orthopedics;   Laterality: Right;  . Joint replacement  2002 & 2007    bilateral hip replacement  . Total hip revision Right 11/30/2012    Procedure: RIGHT TOTAL HIP ARTHROPLASTY REIMPLANTATION;  Surgeon: [name], MD;  Location: [facility] ORS;  Service: Orthopedics;  Laterality: Right;     Review of Systems No fever chills. No change in the color of his stools, had a bowel movement daily, no blood in the stools. No dysphasia or odynophagia. No GERD symptoms. No dysuria or gross hematuria. Off xarelto for 2 weeks    Objective:   Physical Exam BP 127/83  Pulse 90  Temp(Src) 98.1 F (36.7 C)  Wt 187 lb (84.823 kg)  BMI 30.2 kg/m2  SpO2 98%  General -- alert, well-developed, NAD.   Lungs -- normal respiratory effort, no intercostal retractions, no accessory muscle use, and normal breath sounds.  Heart-- normal rate, regular rhythm, no murmur.  Abdomen-- Not distended, good bowel sounds,soft, minimal tenderness w/o mass, rebound or rigidity at the sides of the lower abdomen.  Extremities-- no pretibial edema bilaterally  Neurologic--  alert & oriented X3.   Psych-- Cognition and judgment appear intact. Cooperative with normal attention span and concentration. No anxious appearing , no depressed appearing.      Assessment & Plan:

## 2013-01-01 ENCOUNTER — Telehealth: Payer: Self-pay | Admitting: Internal Medicine

## 2013-01-01 LAB — COMPREHENSIVE METABOLIC PANEL
ALT: 41 U/L (ref 0–53)
AST: 27 U/L (ref 0–37)
Alkaline Phosphatase: 121 U/L — ABNORMAL HIGH (ref 39–117)
CO2: 25 mEq/L (ref 19–32)
Sodium: 138 mEq/L (ref 135–145)
Total Bilirubin: 1.6 mg/dL — ABNORMAL HIGH (ref 0.3–1.2)
Total Protein: 7.6 g/dL (ref 6.0–8.3)

## 2013-01-01 LAB — CBC WITH DIFFERENTIAL/PLATELET
Basophils Absolute: 0.1 10*3/uL (ref 0.0–0.1)
HCT: 34.5 % — ABNORMAL LOW (ref 39.0–52.0)
Lymphs Abs: 1.8 10*3/uL (ref 0.7–4.0)
MCHC: 33 g/dL (ref 30.0–36.0)
MCV: 80 fl (ref 78.0–100.0)
Monocytes Absolute: 0.6 10*3/uL (ref 0.1–1.0)
Platelets: 303 10*3/uL (ref 150.0–400.0)
RDW: 15.6 % — ABNORMAL HIGH (ref 11.5–14.6)

## 2013-01-01 NOTE — Telephone Encounter (Signed)
Labs reviewed, WBC is slightly elevated but better than before, bilirubin and alkaline phosphatase slightly high. Unable to leave a message at his cell phone. We'll try again later

## 2013-01-01 NOTE — Telephone Encounter (Signed)
Spoke with pt, advised of labs. No further questions or concerns.

## 2013-01-01 NOTE — Telephone Encounter (Signed)
Patient spoke with my nurse, he he was feeling "much much better". In light of his improvement, labs are not likely to be clinically significant.

## 2013-01-02 LAB — URINE CULTURE: Colony Count: NO GROWTH

## 2013-01-03 ENCOUNTER — Telehealth: Payer: Self-pay | Admitting: *Deleted

## 2013-01-03 DIAGNOSIS — R109 Unspecified abdominal pain: Secondary | ICD-10-CM

## 2013-01-03 NOTE — Telephone Encounter (Signed)
Okay, I just enter a order for a CT of the abdomen, please notify the scheduler, needs to be done either today or tomorrow morning. Also that the patient I'm ordering this test, ER if symptoms severe, fever or chills.

## 2013-01-03 NOTE — Telephone Encounter (Signed)
Pt scheduled for 01/04/13 @1130 . Pt also notified if sx get worse fever/chills etc to go to ER. DJR

## 2013-01-03 NOTE — Telephone Encounter (Signed)
Pt notified of lab result via tele. Pt states still experiencing abd pain. DJR

## 2013-01-03 NOTE — Addendum Note (Signed)
Addended by: Kathlene November E on: 01/03/2013 04:01 PM   Modules accepted: Orders

## 2013-01-03 NOTE — Telephone Encounter (Signed)
Message copied by Peggyann Shoals on Thu Jan 03, 2013  3:39 PM ------      Message from: Kathlene November E      Created: Thu Jan 03, 2013  1:33 PM       Notify patient, urine culture came back negative. Please ask about abdominal pain, if he is not back to normal let me know, he will need further testing ------

## 2013-01-04 ENCOUNTER — Ambulatory Visit (HOSPITAL_BASED_OUTPATIENT_CLINIC_OR_DEPARTMENT_OTHER)
Admission: RE | Admit: 2013-01-04 | Discharge: 2013-01-04 | Disposition: A | Discharge status: Home / Self Care | Payer: BC Managed Care – PPO | Source: Ambulatory Visit | Attending: Internal Medicine | Admitting: Internal Medicine

## 2013-01-04 DIAGNOSIS — M1990 Unspecified osteoarthritis, unspecified site: Secondary | ICD-10-CM | POA: Insufficient documentation

## 2013-01-04 DIAGNOSIS — Z96649 Presence of unspecified artificial hip joint: Secondary | ICD-10-CM | POA: Insufficient documentation

## 2013-01-04 DIAGNOSIS — R109 Unspecified abdominal pain: Secondary | ICD-10-CM

## 2013-01-04 MED ORDER — IOHEXOL 300 MG/ML  SOLN
100.0000 mL | Freq: Once | INTRAMUSCULAR | Status: AC | PRN
  Administered 2013-01-04: 100 mL via INTRAVENOUS

## 2013-01-05 ENCOUNTER — Other Ambulatory Visit: Payer: Self-pay | Admitting: Internal Medicine

## 2013-01-05 DIAGNOSIS — K859 Acute pancreatitis without necrosis or infection, unspecified: Secondary | ICD-10-CM

## 2013-01-22 ENCOUNTER — Encounter: Payer: Self-pay | Admitting: Internal Medicine

## 2013-01-31 ENCOUNTER — Ambulatory Visit (INDEPENDENT_AMBULATORY_CARE_PROVIDER_SITE_OTHER): Payer: BC Managed Care – PPO | Admitting: Internal Medicine

## 2013-01-31 ENCOUNTER — Encounter: Payer: Self-pay | Admitting: Internal Medicine

## 2013-01-31 ENCOUNTER — Other Ambulatory Visit (INDEPENDENT_AMBULATORY_CARE_PROVIDER_SITE_OTHER): Payer: BC Managed Care – PPO

## 2013-01-31 VITALS — BP 120/76 | HR 60 | Ht 66.0 in | Wt 188.2 lb

## 2013-01-31 DIAGNOSIS — R932 Abnormal findings on diagnostic imaging of liver and biliary tract: Secondary | ICD-10-CM

## 2013-01-31 NOTE — Progress Notes (Signed)
Subjective:    Patient ID: Steve [name], male    DOB: [birth year], 57 y.o.   MRN: [id number]  HPI Pt here for evaluation abnormal  of pancreas found on CT abd/pelvis done on 9/14. He was seen by Dr. Larose Kells for  abdominal pain one month ago. Sudden onset of mild -moderate epigastric/LUQ pain without clear precipitant. This lasted about 6 days. States no blunt force trauma, no excessive alcohol intake, and no gallbladder issues. Pt. has not had any symptoms since CT and doing well. Denies fever, HA, CP, SOB, abdominal pain, and N/V/C/D. No Known Allergies Outpatient Prescriptions Prior to Visit  Medication Sig Dispense Refill  . methocarbamol (ROBAXIN) 500 MG tablet Take 1 tablet (500 mg total) by mouth every 6 (six) hours as needed.  60 tablet  0  . oxyCODONE (OXY IR/ROXICODONE) 5 MG immediate release tablet Take 1-4 tablets (5-20 mg total) by mouth every 3 (three) hours as needed.  80 tablet  0   No facility-administered medications prior to visit.   Past Medical History  Diagnosis Date  . Abscess of muscle 08/10/2011    staph infection of right hip   . Hip dysplasia, congenital     no surgery as a child for hip dysplasia - has had bilateral hip replacements as an adult  . Postoperative anemia due to acute blood loss 09/07/2012  . GERD (gastroesophageal reflux disease)     rare reflux - no meds for reflux - NO PROBLEM IN PAST SEVERAL YRS  . Septic arthritis of hip 09/05/2012    PT'S TOTAL HIP JOINT REMOVED - ANTIBIOTIC SPACE PLACED AND PT HAS FINISHED IV ANTIBIOTICS ( PICC LINE REMOVED)  . Sleep apnea     USES CPAP  . Pancreatitis    Past Surgical History  Procedure Laterality Date  . Hernia repair      inguinal hernia x3  . Tee without cardioversion  07/29/2011    Procedure: TRANSESOPHAGEAL ECHOCARDIOGRAM (TEE);  Surgeon: Josue Hector, MD;  Location: [facility] ENDOSCOPY;  Service: Cardiovascular;  Laterality: N/A;  . Multiple extractions with alveoloplasty  07/28/2011    Procedure: MULTIPLE  EXTRACION WITH ALVEOLOPLASTY;  Surgeon: Lenn Cal, DDS;  Location: [facility] ORS;  Service: Oral Surgery;  Laterality: N/A;  Extraction of tooth #'s 2,3,4,5,6,11,12,13,15,19,22 with alveoloplasty.  . Shoulder repair - right for separation of shoulder    . Total hip revision Right 09/05/2012    Procedure: RIGHT HIP RESECTION ARTHROPLASTY WITH ANTIBIOTIC SPACERS;  Surgeon: [name], MD;  Location: [facility] ORS;  Service: Orthopedics;  Laterality: Right;  . Joint replacement  2002 & 2007    bilateral hip replacement  . Total hip revision Right 11/30/2012    Procedure: RIGHT TOTAL HIP ARTHROPLASTY REIMPLANTATION;  Surgeon: [name], MD;  Location: [facility] ORS;  Service: Orthopedics;  Laterality: Right;  . Colonoscopy  04/26/2007   History   Social History  . Marital Status: Married    Spouse Name: N/A    Number of Children: N/A  . Years of Education: N/A   Social History Main Topics  . Smoking status: Never Smoker   . Smokeless tobacco: Never Used  . Alcohol Use: No     Comment: rare beer  . Drug Use: No  . Sexual Activity: Yes    Partners: Female          Social History Narrative   Married, 1 son and 3 sons.   Welder/fabricator   2 caffeinated beverages daily   Family  History  Problem Relation Age of Onset  . Melanoma Mother   . Heart attack Mother   . Hyperlipidemia Neg Hx   . Sudden death Neg Hx   . Hypertension Neg Hx   . Diabetes Neg Hx         Review of Systems As per HPI - he is recovering from complications of hip replacement (infection). Not yet back to work. All other ROS negative    Objective:   Physical Exam General:  NAD Eyes:   Anicteric Neck:   supple Lungs:  clear Heart:  S1S2 no rubs, murmurs or gallops Abdomen:  soft and nontender, BS+ Ext:   no edema Psych:  Appropriate mood and affect  Data Reviewed:  CT abd/pelvis 01/03/2013  IMPRESSION:  Mild peripancreatic stranding along the body and tail, raising the  possibility of acute  pancreatitis. Laboratory correlation is  suggested.  No drainable fluid collection/pseudocyst.  Otherwise, no CT findings to account for the patient's abdominal  pain.        Assessment & Plan:  Abnormal CT scan, pancreas - tail area - Plan: Amylase, Lipase, CT Abdomen W Contrast  Lab Results  Component Value Date   LIPASE 38.0 01/31/2013   Lab Results  Component Value Date   AMYLASE 80 01/31/2013   I appreciate the opportunity to care for this patient. Cc: Kathlene November, MD

## 2013-01-31 NOTE — Patient Instructions (Signed)
Your physician has requested that you go to the basement for the following lab work before leaving today: Amylase, Lipase  You have been scheduled for a CT scan of the abdomen and pelvis at [facility] ([address]---this is in the same building as [facility]).   You are scheduled on 03/25/13 at 1:30pm. You should arrive 30 minutes prior to your appointment time for registration. Please follow the written instructions below on the day of your exam:  WARNING: IF YOU ARE ALLERGIC TO IODINE/X-RAY DYE, PLEASE NOTIFY RADIOLOGY IMMEDIATELY AT [phone]! YOU WILL BE GIVEN A 13 HOUR PREMEDICATION PREP.  1) Do not eat or drink anything after 9:30am (4 hours prior to your test)  You may take any medications as prescribed with a small amount of water except for the following: Metformin, Glucophage, Glucovance, Avandamet, Riomet, Fortamet, Actoplus Met, Janumet, Glumetza or Metaglip. The above medications must be held the day of the exam AND 48 hours after the exam.  The purpose of you drinking the oral contrast is to aid in the visualization of your intestinal tract. The contrast solution may cause some diarrhea. Before your exam is started, you will be given a small amount of fluid to drink. Depending on your individual set of symptoms, you may also receive an intravenous injection of x-ray contrast/dye. Plan on being at [facility] for 30 minutes or long, depending on the type of exam you are having performed.  This test typically takes 30-45 minutes to complete.  If you have any questions regarding your exam or if you need to reschedule, you may call the CT department at [phone] between the hours of 8:00 am and 5:00 pm, Monday-Friday.  ________________________________________________________________________ It is the time of year to have a vaccination to prevent the flu (influenza virus).  Please have this done through your primary care provider or you can get  this done at local pharmacies or the [facility]. It would be very helpful if you notify your primary care provider when and where you had the vaccination given by messaging them in My Chart, leaving a message or faxing the information.   I appreciate the opportunity to care for you.

## 2013-01-31 NOTE — Assessment & Plan Note (Signed)
Found after he had self-limited epigastric and LUQ pain. Not clear if this is significant. Asx now. Will recheck CT abd pancreatic protocol in dec 3 months after the original to look for possible mass lesion or other abnormality.  Lipase and amylase NL now (not checked initially so do not know if elevated - pain was vague)

## 2013-02-01 ENCOUNTER — Telehealth: Payer: Self-pay | Admitting: Internal Medicine

## 2013-02-01 DIAGNOSIS — R932 Abnormal findings on diagnostic imaging of liver and biliary tract: Secondary | ICD-10-CM

## 2013-02-01 NOTE — Telephone Encounter (Signed)
Spoke to [place] with [facility] [phone] and he assisted in scheduling patients CT Abdomin with pancreatic protocol for 03/25/13 at 1:30pm at [facility] , located at [place] [address] .  Patient is to arrive at 12:00 noon to prep there and NPO after 9:30am.  Spoke to patient and he verbalized understanding date/time/place.  He has already canceled the CT that was to be done at [facility] CT due to his insurance not coving that facility. Order faxed to [phone].

## 2013-02-01 NOTE — Progress Notes (Signed)
Quick Note:  Lipase and amylase tests ok He will have a CT abd in Dec and we will call results after ______

## 2013-02-17 IMAGING — CT CT ABD-PELV W/ CM
2 of 5 series · 17 of 46 positions shown, 19 images · IV contrast (Omnipaque 300)
Comparison: None.

CLINICAL DATA: Right lower quadrant pain.  Evaluate for
appendicitis.

CT ABDOMEN AND PELVIS WITH CONTRAST
TECHNIQUE: Multidetector CT imaging of the abdomen and pelvis was
performed following the standard protocol during bolus
administration of intravenous contrast.
Contrast: 100 ml [id number]-ZZZ.

[Series 2: abd/ pel 5mm · axial · 0.68mm/px · z∈[-418,+6]mm · 14 of 95 slices shown, 16 images]
[im 5/95  soft-tissue]
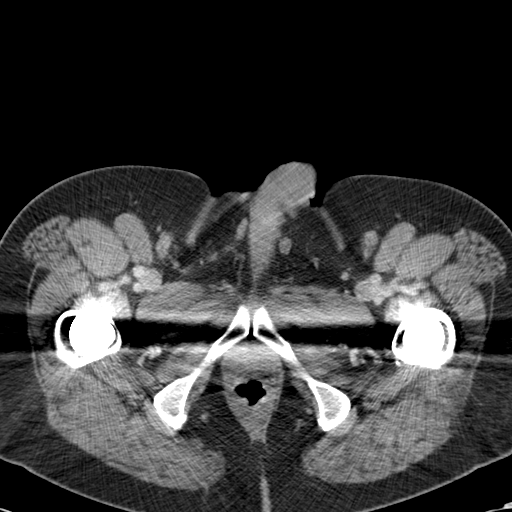
[im 5/95  bone]
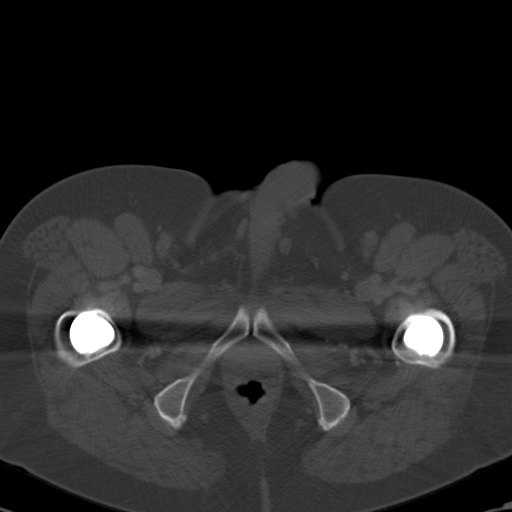
[im 15/95  soft-tissue]
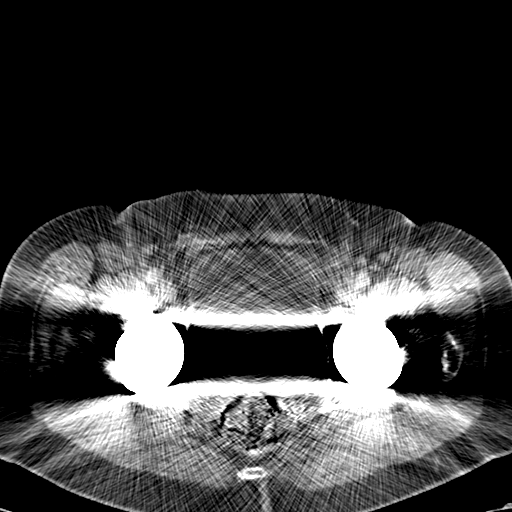
[im 24/95  soft-tissue]
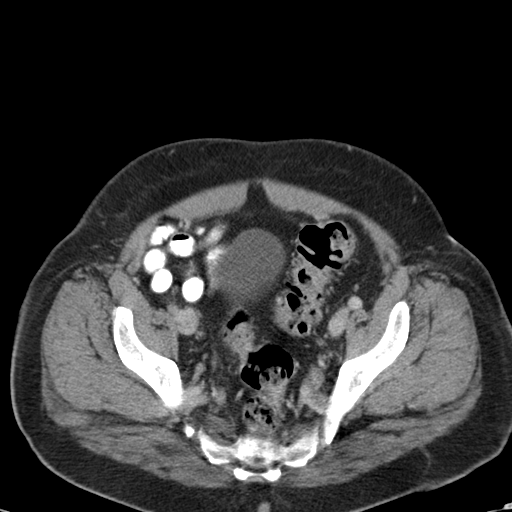
[im 29/95  soft-tissue]
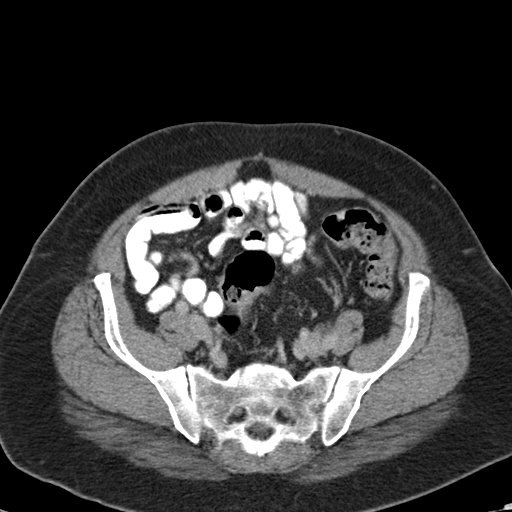
[im 33/95  soft-tissue]
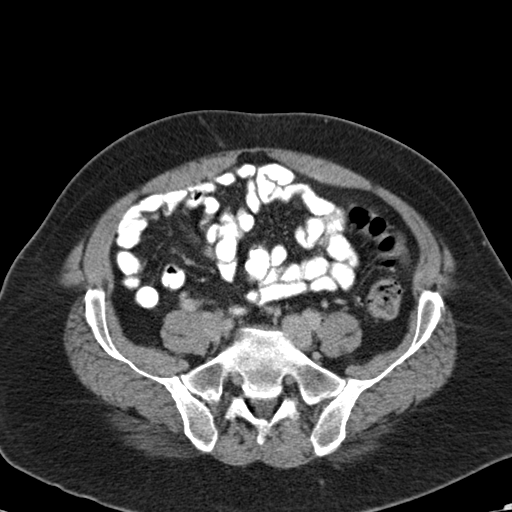
[im 43/95  soft-tissue]
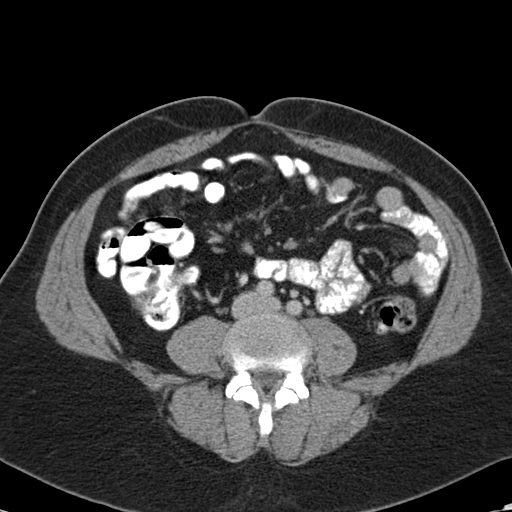
[im 48/95  soft-tissue]
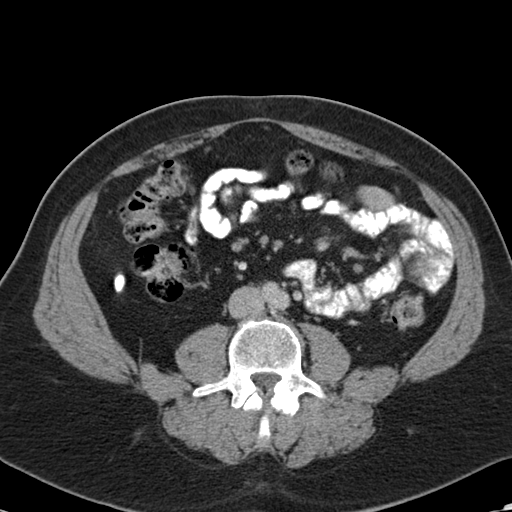
[im 52/95  soft-tissue]
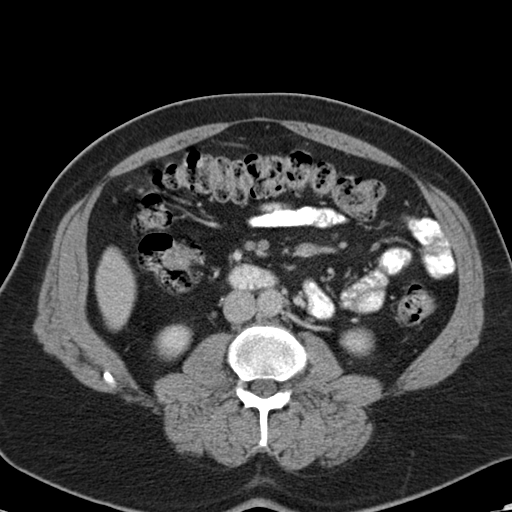
[im 57/95  soft-tissue]
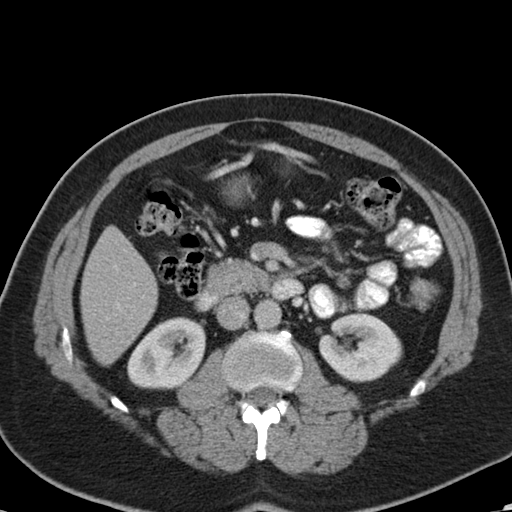
[im 57/95  bone]
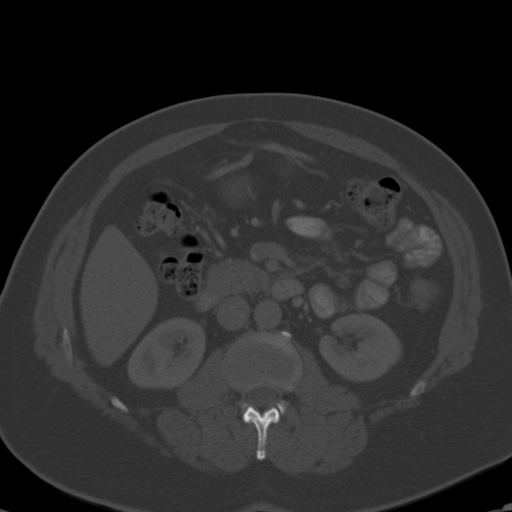
[im 66/95  soft-tissue]
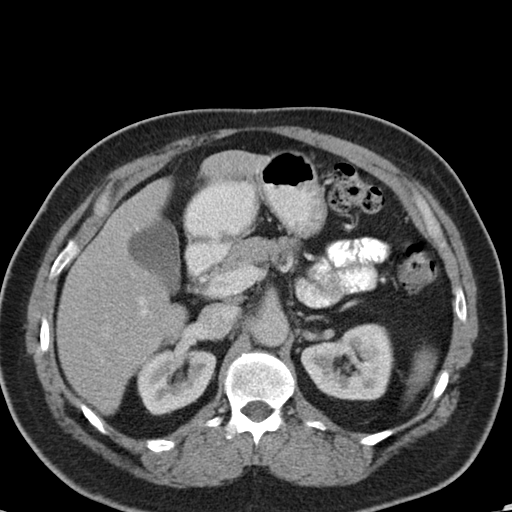
[im 71/95  soft-tissue]
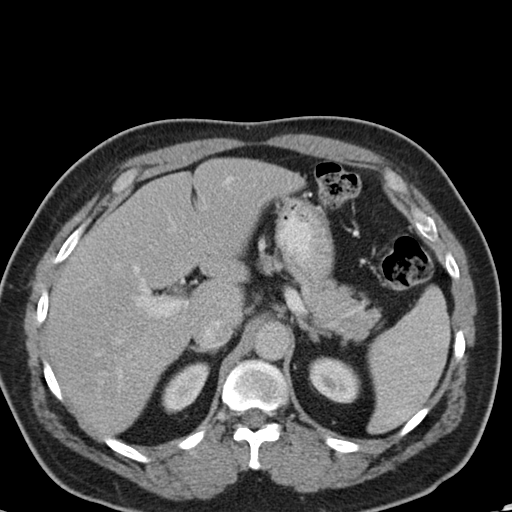
[im 76/95  soft-tissue]
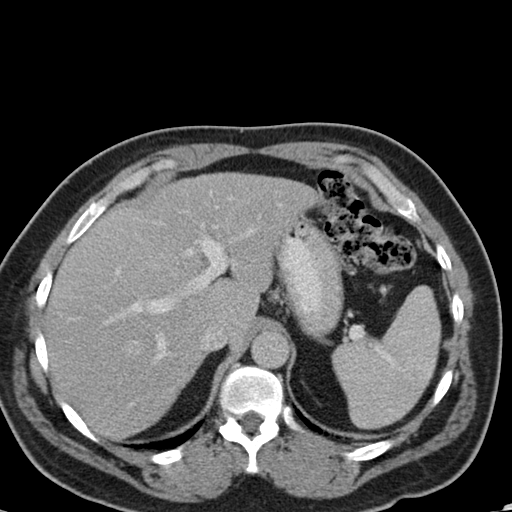
[im 85/95  soft-tissue]
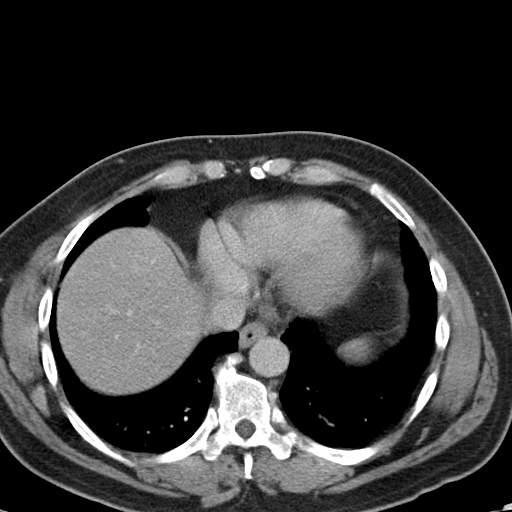
[im 90/95  soft-tissue]
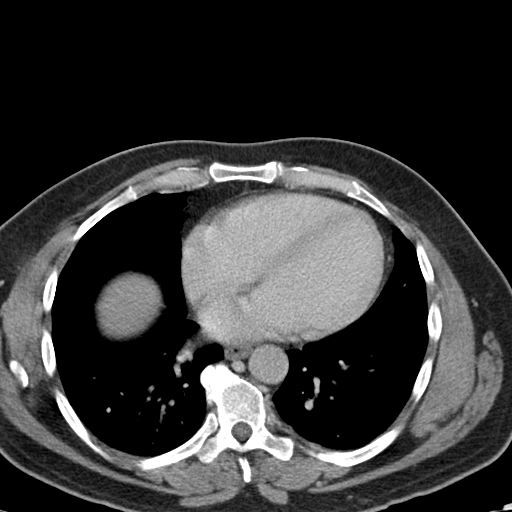

[Series 602: cor · coronal · 0.96mm/px · 3 of 117 slices shown]
[im 39/117  soft-tissue]
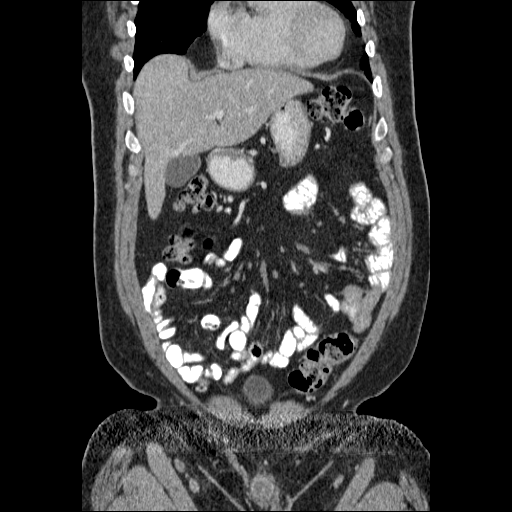
[im 52/117  soft-tissue]
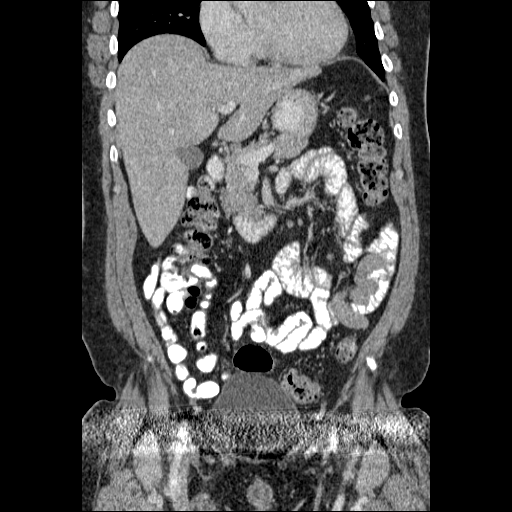
[im 65/117  soft-tissue]
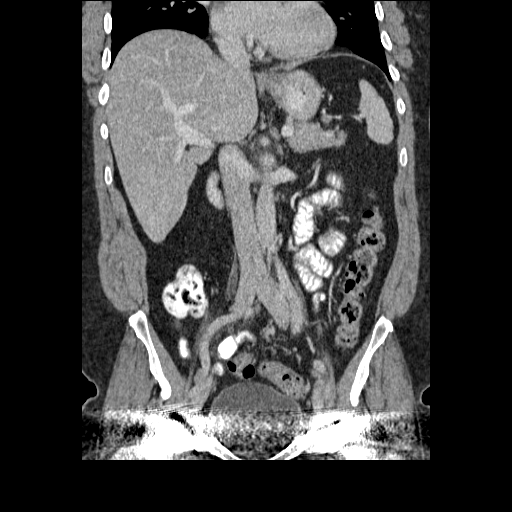

[17 of 46 positions shown; findings below may reference images not displayed]

FINDINGS: Lung bases show mild dependent atelectasis.  Heart is
mildly enlarged.  No pericardial or pleural effusion.

Liver, gallbladder, adrenal glands, kidneys, spleen, pancreas,
stomach, small bowel and appendix are unremarkable.  A fair amount
of stool is present in the colon, which is otherwise unremarkable.
No pathologically enlarged lymph nodes.  No free fluid.  No
worrisome lytic or sclerotic lesions.  Bilateral hip
arthroplasties.
IMPRESSION: 1.  No evidence of appendicitis.
2.  Suspect mild constipation.

## 2013-03-25 ENCOUNTER — Other Ambulatory Visit: Payer: BC Managed Care – PPO

## 2013-04-01 ENCOUNTER — Telehealth: Payer: Self-pay

## 2013-04-01 NOTE — Telephone Encounter (Signed)
Message copied by Marlon Pel on Mon Apr 01, 2013 11:02 AM ------      Message from: Gatha Mayer      Created: Fri Mar 29, 2013  6:18 PM      Regarding: CT ok       I have received copy of CT report from [facility]            It is ok            Do not recommend further imaging            [facility] he is ok            F/u PRN            Please call and then cc Dr. Larose Kells his PCP ------

## 2013-04-01 NOTE — Telephone Encounter (Signed)
The mailbox is full.  I will continue to try and reach the patient

## 2013-04-08 NOTE — Telephone Encounter (Signed)
Patient advised.

## 2013-04-08 NOTE — Telephone Encounter (Signed)
Unable to leave a message.  i will continue to try and reach the patient

## 2013-04-22 ENCOUNTER — Encounter: Payer: Self-pay | Admitting: Internal Medicine

## 2013-12-25 ENCOUNTER — Encounter: Payer: Self-pay | Admitting: Internal Medicine

## 2014-01-07 IMAGING — CT CT ABD-PELV W/ CM
1 of 4 series · 14 of 32 positions shown, 19 images · IV contrast (APPLIED)
Comparison: 09/02/2010

CLINICAL DATA: Right lower quadrant/groin pain, fever, prior hernia
repair, evaluate for appendicitis

CT ABDOMEN AND PELVIS WITH CONTRAST
TECHNIQUE: Multidetector CT imaging of the abdomen and pelvis was
performed following the standard protocol during bolus
administration of intravenous contrast.
Contrast: 100mL OMNIPAQUE IOHEXOL 300 MG/ML  SOLN

[Series 2: abd/pel with · axial · 0.73mm/px · z∈[-539,-139]mm · 14 of 92 slices shown, 19 images]
[im 6/92  soft-tissue]
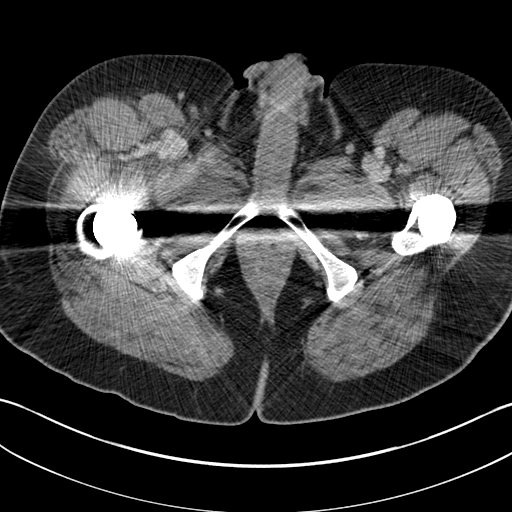
[im 6/92  bone]
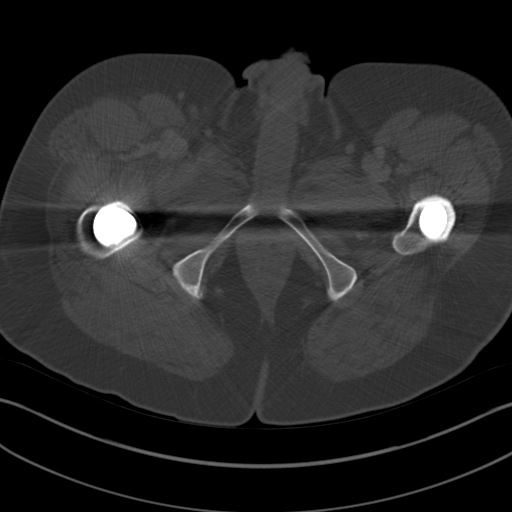
[im 11/92  soft-tissue]
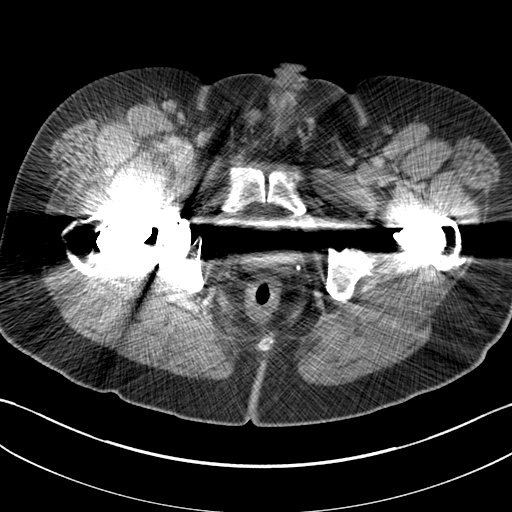
[im 22/92  soft-tissue]
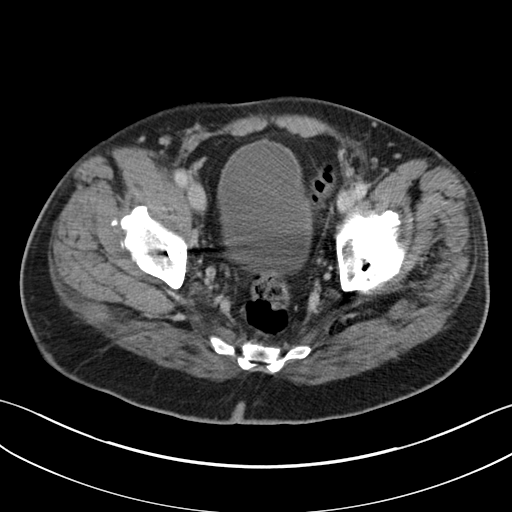
[im 27/92  soft-tissue]
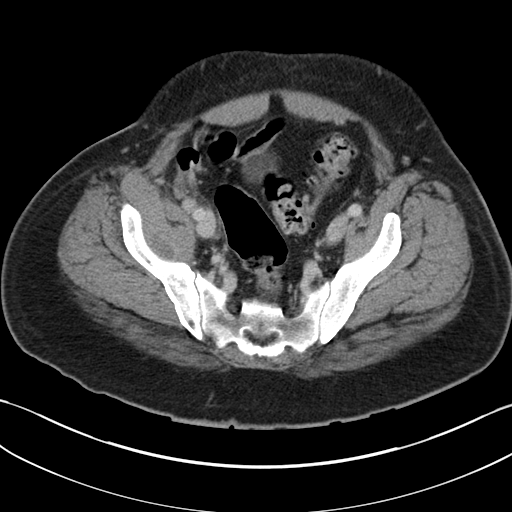
[im 33/92  soft-tissue]
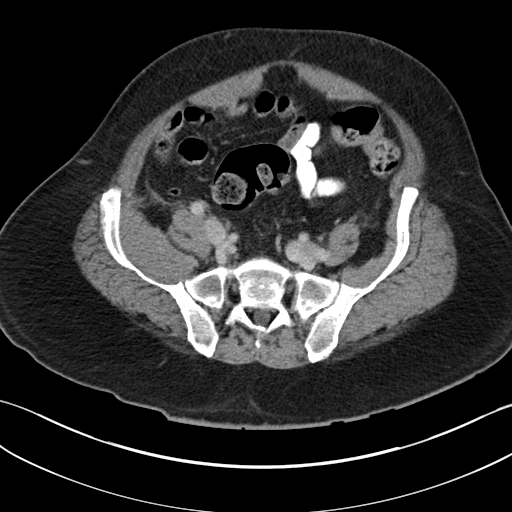
[im 38/92  soft-tissue]
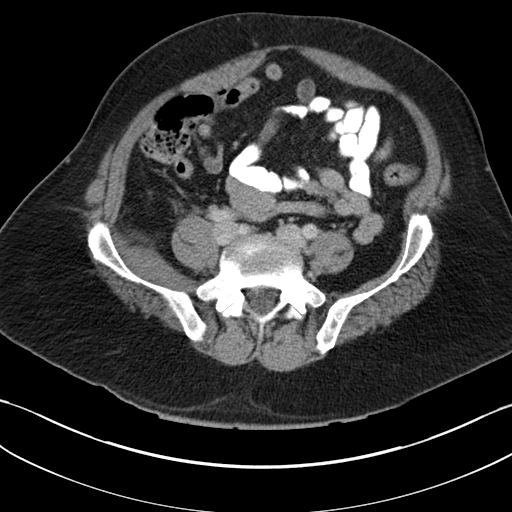
[im 49/92  soft-tissue]
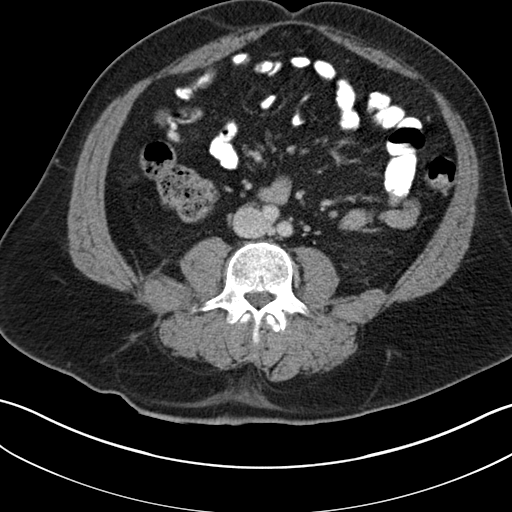
[im 54/92  soft-tissue]
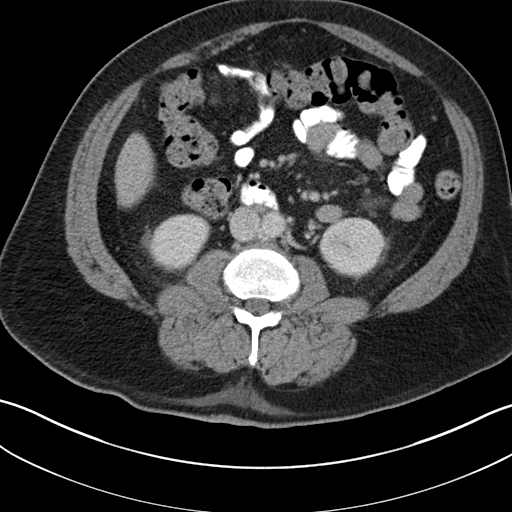
[im 59/92  soft-tissue]
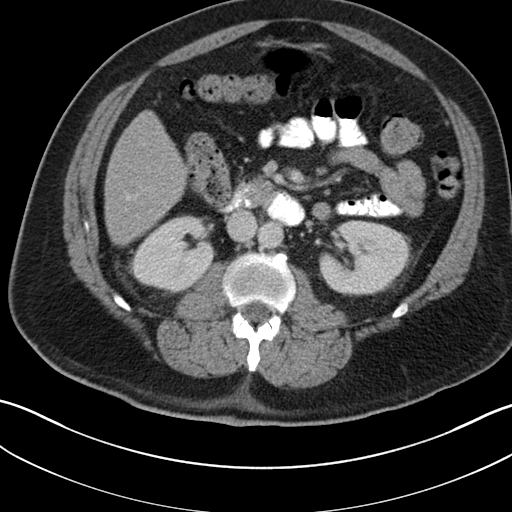
[im 59/92  bone]
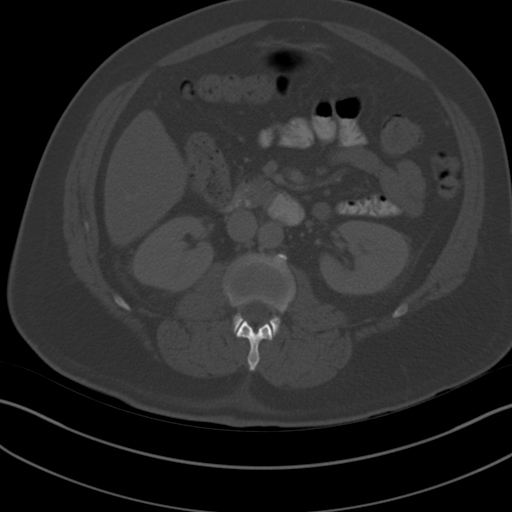
[im 65/92  soft-tissue]
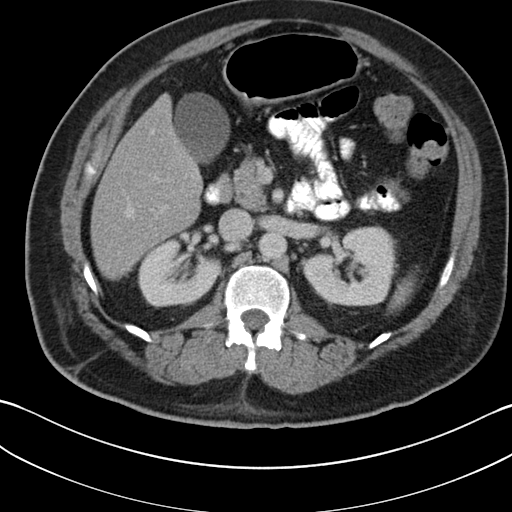
[im 70/92  soft-tissue]
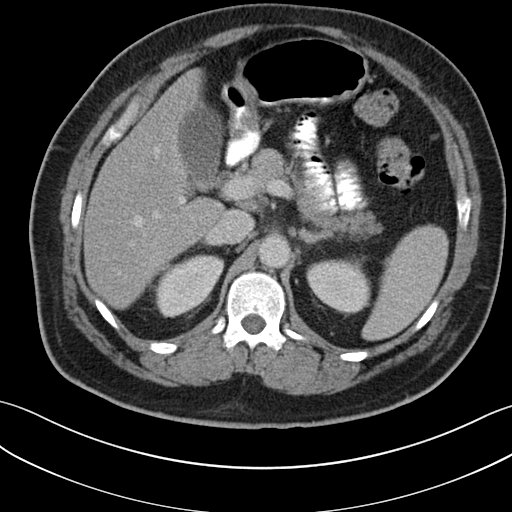
[im 70/92  lung]
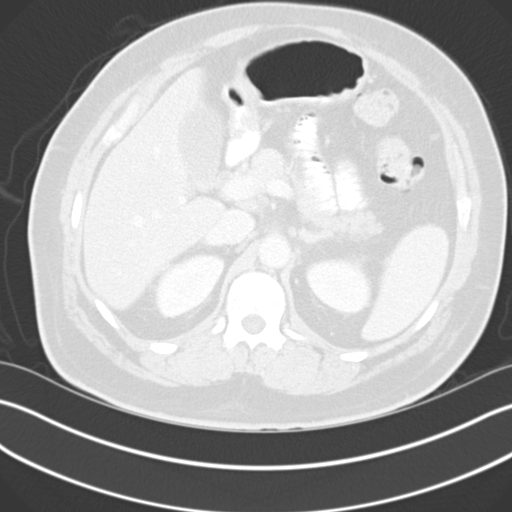
[im 75/92  lung]
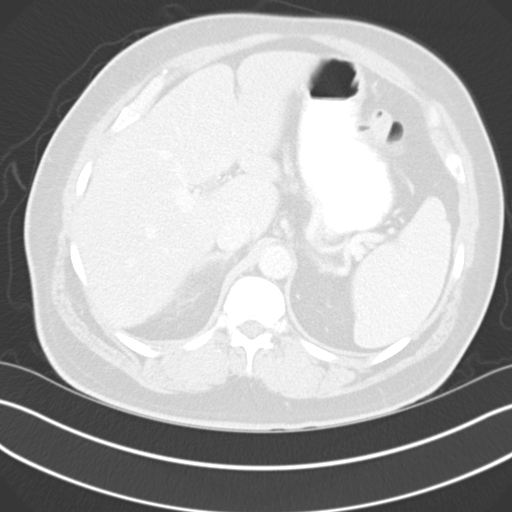
[im 81/92  soft-tissue]
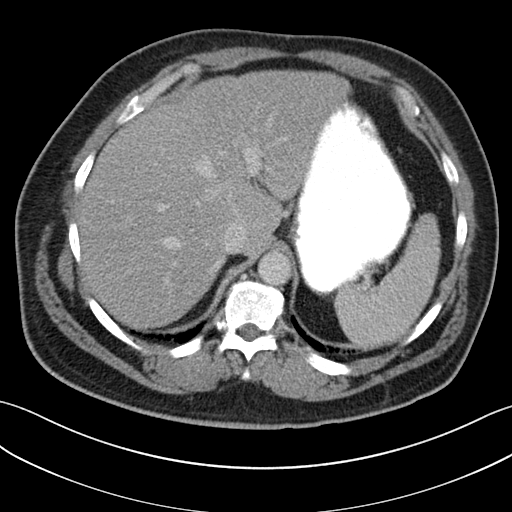
[im 81/92  lung]
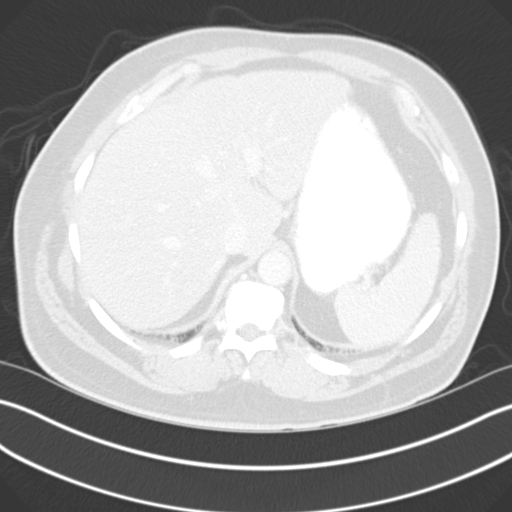
[im 86/92  soft-tissue]
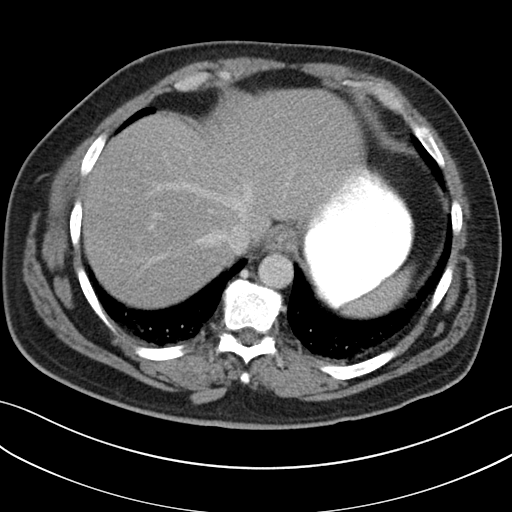
[im 86/92  lung]
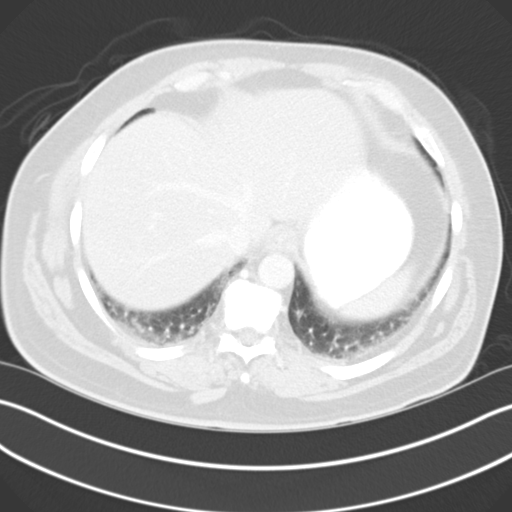

[14 of 32 positions shown; findings below may reference images not displayed]

FINDINGS: Mild dependent atelectasis at the lung bases.

Liver, spleen, pancreas, and adrenal glands are within normal
limits.

Gallbladder is unremarkable.  No intrahepatic or extrahepatic
ductal dilatation.

Kidneys are within normal limits.  No hydronephrosis.

No evidence of bowel obstruction.  Normal appendix.  Colonic
diverticulosis, without associated inflammatory changes.

No evidence of abdominal aortic aneurysm.

No abdominopelvic ascites.

No suspicious abdominopelvic lymphadenopathy.

Prostate and bladder are partially obscured by streak artifact from
the patient's bilateral hip prostheses.

Tiny fat-containing level hernia.  Suspected postsurgical changes
in the right inguinal region.

Enlargement of the right iliacus muscle with associated 1.5 x
cm intramuscular hypodensity (series 2/image 66) and mild right
retroperitoneal stranding (series 2/image 59).

Degenerative changes of the visualized thoracolumbar spine.
Bilateral hip arthroplasties.
IMPRESSION: Enlargement of the right iliacus muscle with associated 1.5 x
intramuscular hypodensity and mild right retroperitoneal stranding.
This appearance suggests a retroperitoneal hematoma or less likely
intramuscular infection/abscess.

Normal appendix.  No evidence of bowel obstruction.

## 2014-01-10 IMAGING — PX DG ORTHOPANTOGRAM /PANORAMIC
1 series · 1 of 1 positions shown · non-contrast
Comparison: None.

CLINICAL DATA: Staph infection of the right hip.  Question
periapical abscess.

ORTHOPANTOGRAM/PANORAMIC

[Series 1: — · U · 1 of 1 slices shown]
[im 1/1]
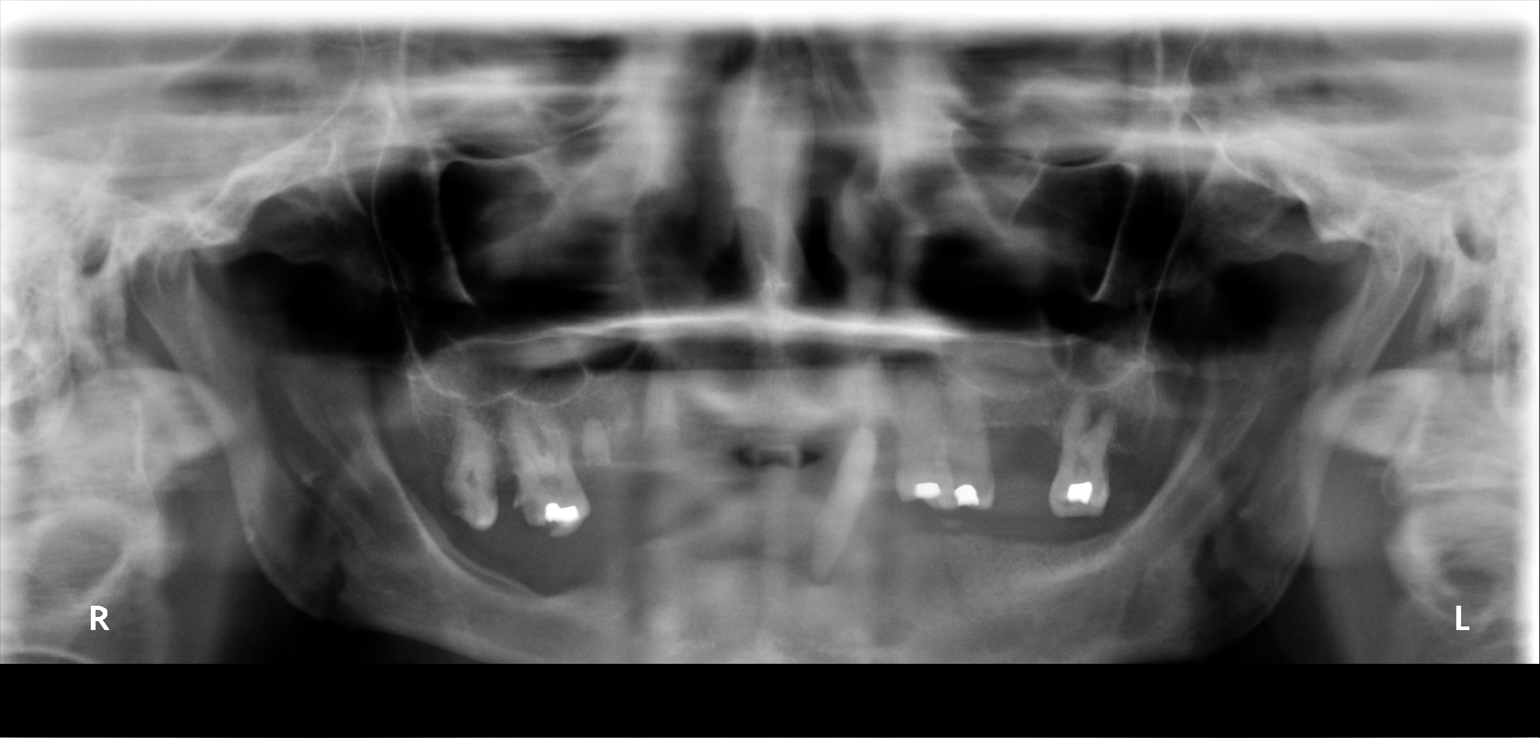

[1 of 1 positions shown; findings below may reference images not displayed]

FINDINGS: The patient is nearly the edentulous.  Four maxillary
teeth on the right are identified.  There are cavities in each of
these teeth.  No definite periapical abscess is identified.  There
is an elongated radiodense structure in the expected location of a
lower left medial lower incisor which may represent a dental
implant. There is periapical lucency about this structure.  No
fracture is identified.
IMPRESSION: 1.  Possible left lower incisor dental implant has periapical
lucency about it worrisome for abscess.
2.  Cavities are present in all maxillary teeth on the right.

## 2014-02-28 IMAGING — CT CT HIP*R* W/CM
2 of 4 series · 8 of 46 positions shown, 9 images · IV contrast (CONTRAST)
Comparison: Pelvic CTs 07/23/2011 and 09/02/2010.

CLINICAL DATA: Follow-up right hip abscess status post initiation
of antibiotic therapy.

CT OF THE RIGHT HIP WITH CONTRAST
TECHNIQUE: Multidetector CT imaging was performed following the
standard protocol during bolus administration of intravenous
contrast.
Contrast: 100mL OMNIPAQUE IOHEXOL 300 MG/ML  SOLN

[Series 4: hip soft tissue · axial · 0.37mm/px · z∈[+578,+873]mm · 5 of 385 slices shown, 6 images]
[im 45/385  soft-tissue]
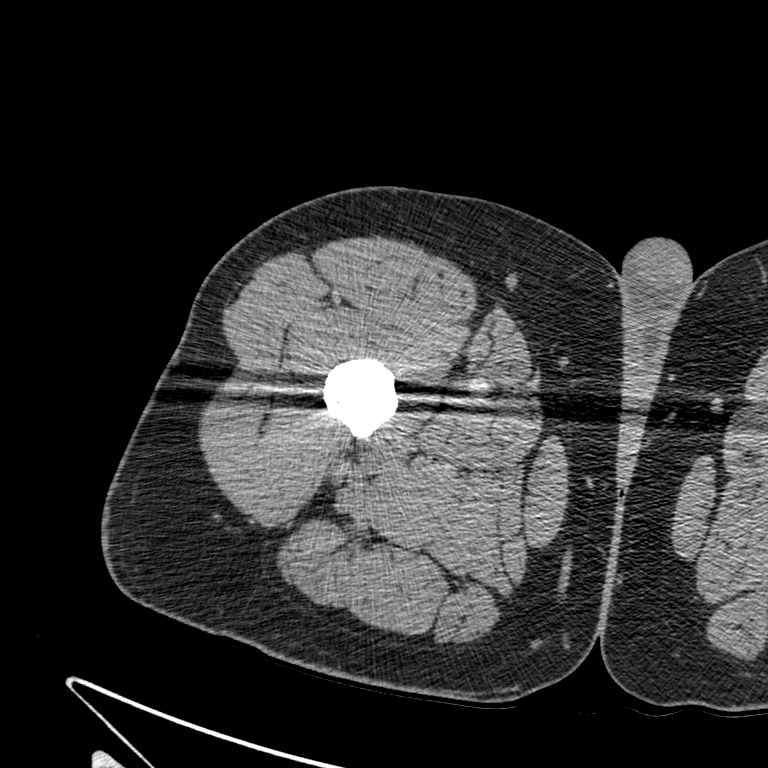
[im 45/385  bone]
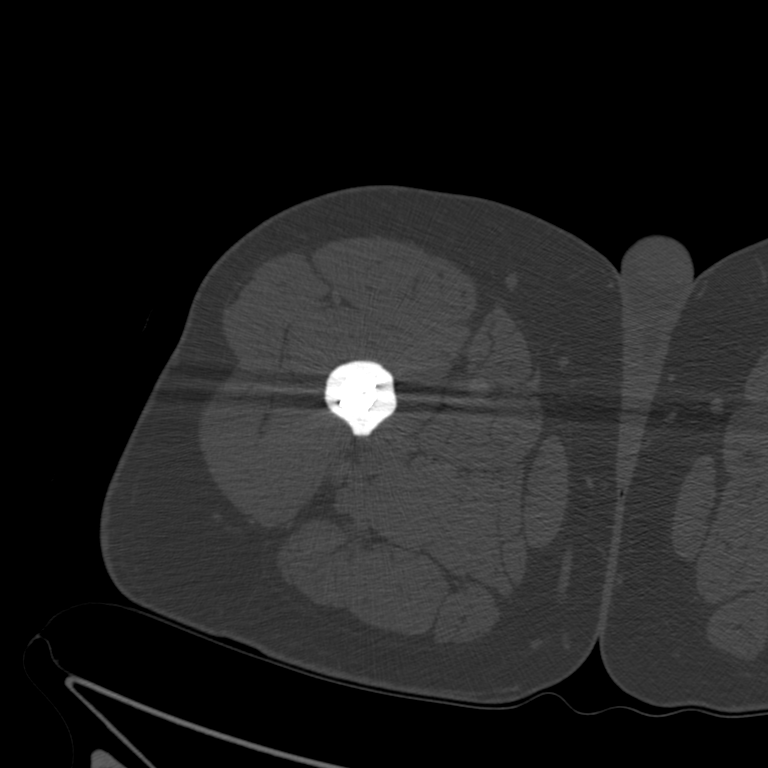
[im 119/385  soft-tissue]
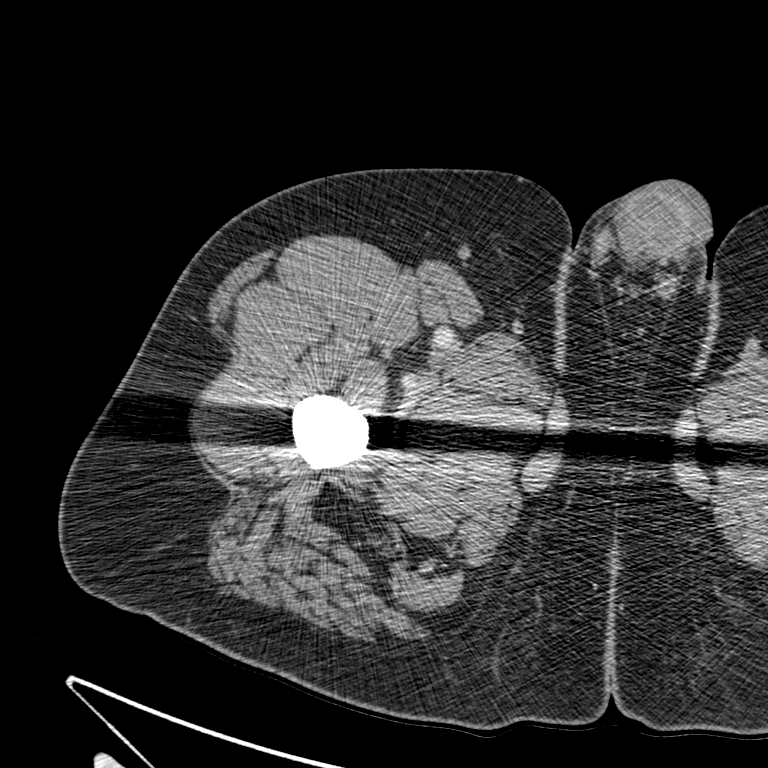
[im 193/385  soft-tissue]
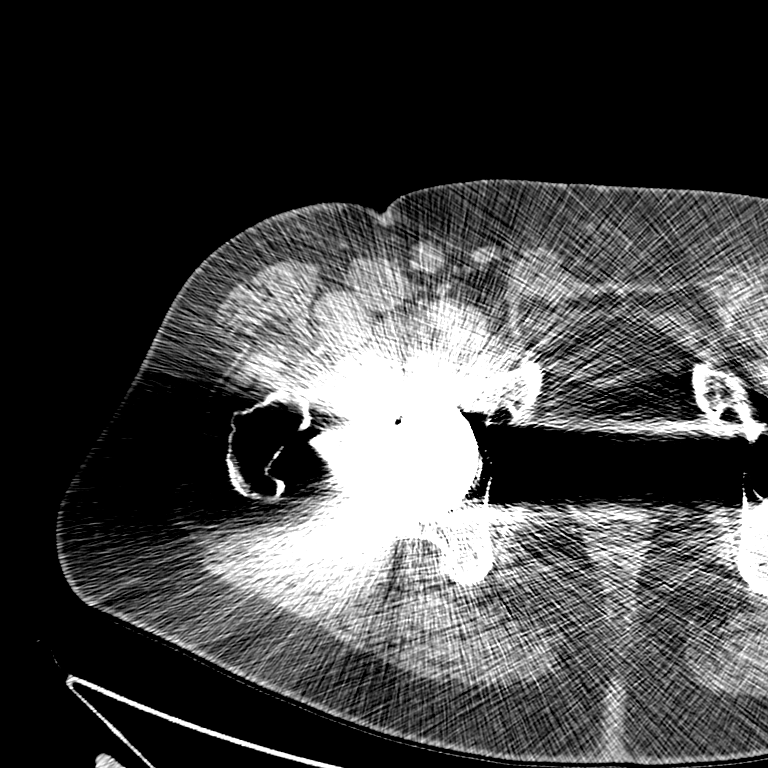
[im 266/385  soft-tissue]
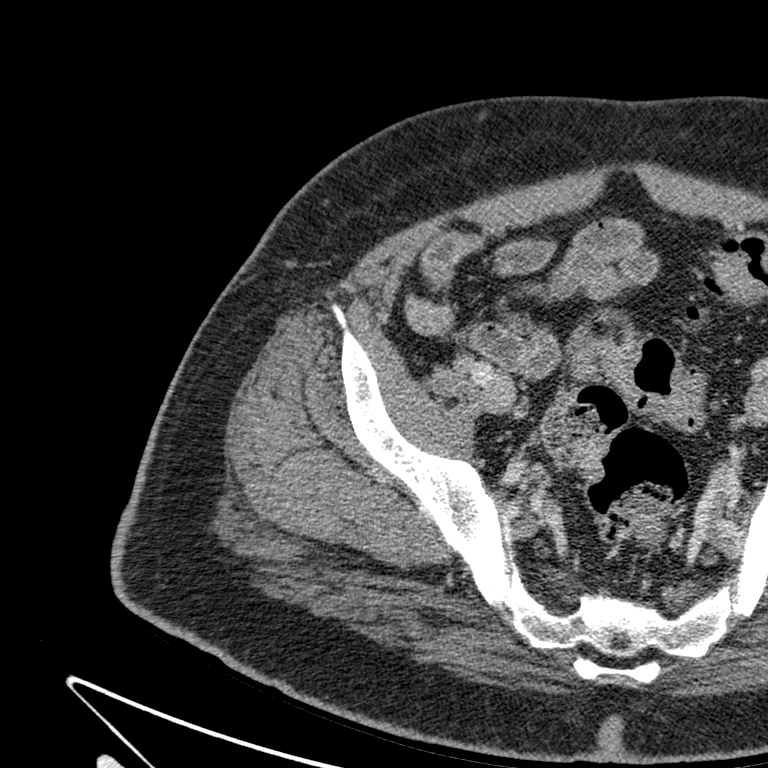
[im 340/385  soft-tissue]
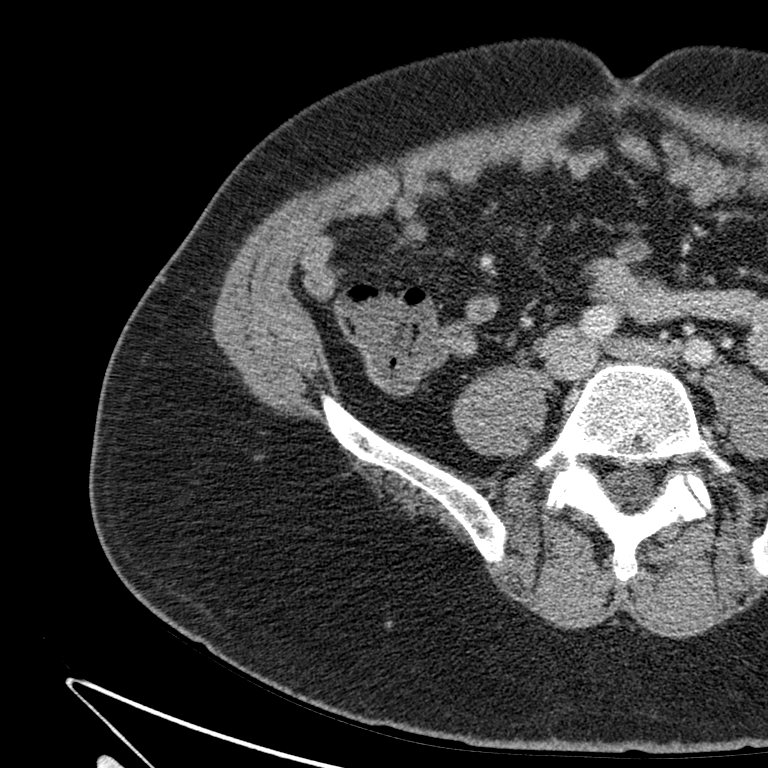

[Series 8044: coronal soft tissue · coronal · 0.50mm/px · 3 of 90 slices shown]
[im 30/90  soft-tissue]
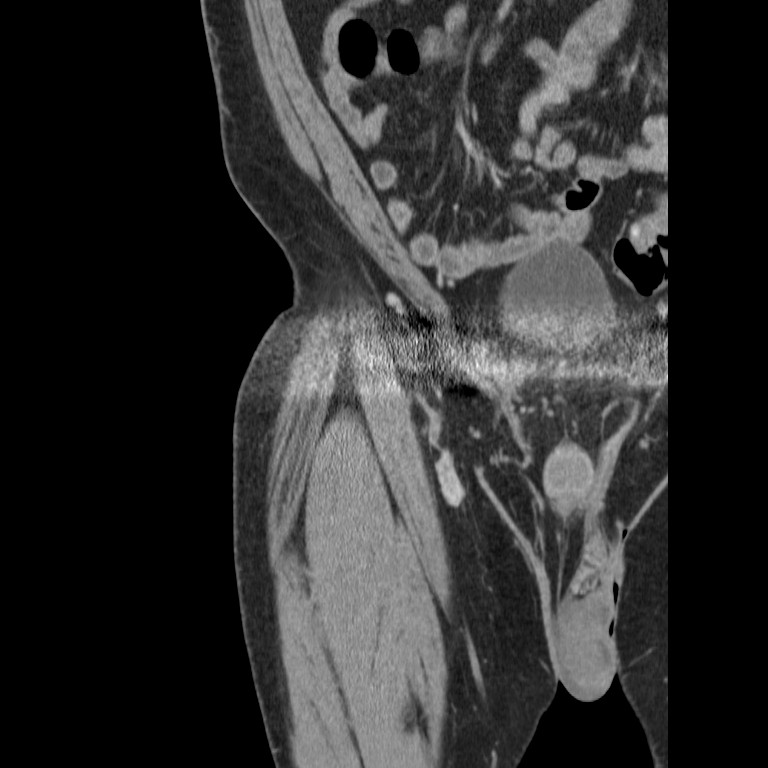
[im 40/90  soft-tissue]
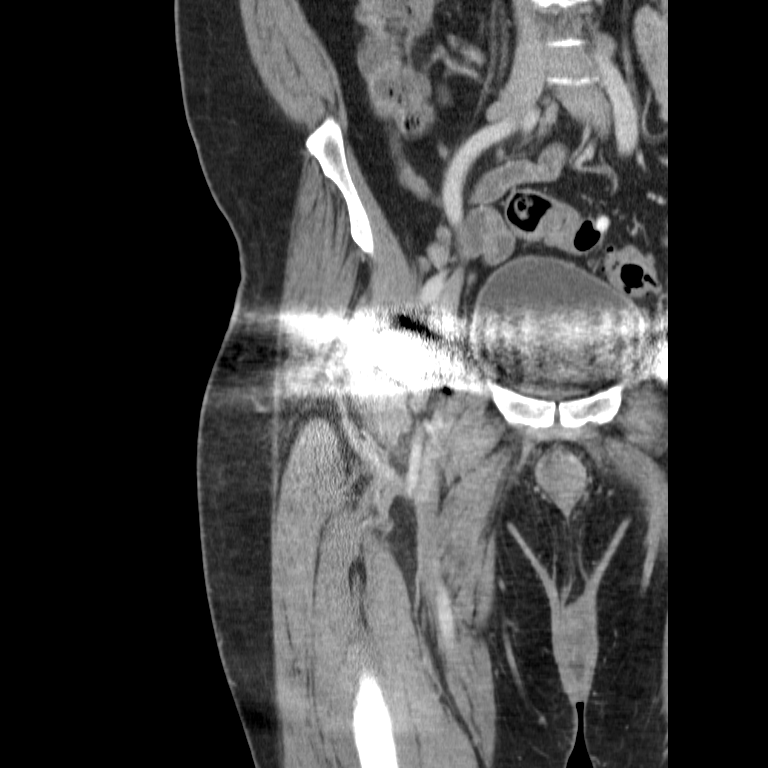
[im 50/90  soft-tissue]
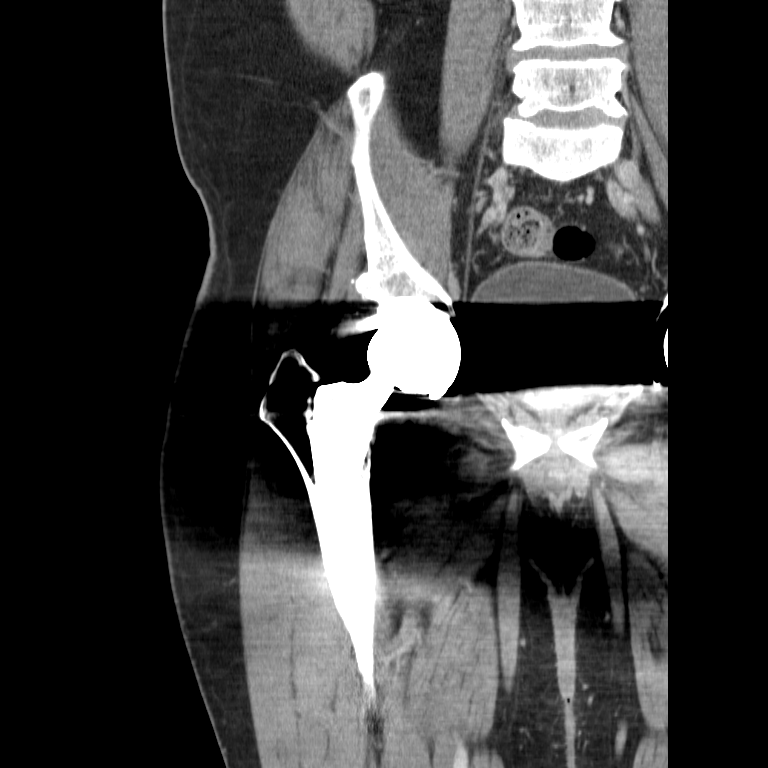

[8 of 46 positions shown; findings below may reference images not displayed]

FINDINGS: No residual fluid collection is seen within the right
iliacus muscle.  There is no significant muscular enlargement or
surrounding inflammatory change.  The right gluteus and proximal
thigh musculature appears normal.

The right total hip arthroplasty has a stable appearance without
evidence of loosening.  There is no evidence of acute fracture,
dislocation or bone destruction.  The visualized sacroiliac joints
appear normal.  No periarticular abnormalities are identified
(evaluation limited by beam hardening artifact from the total hip
replacement).  Small external iliac lymph nodes and sigmoid colon
diverticular changes are stable.
IMPRESSION: 1.  Resolution of previously demonstrated fluid collection within
the right iliacus muscle. No residual retroperitoneal inflammatory
change identified.
2.  No evidence of right pelvic or proximal femoral osteomyelitis.
3.  Stable appearance of right total hip arthroplasty.

## 2015-02-11 IMAGING — CR DG ORBITS FOR FOREIGN BODY
2 series · 2 of 2 positions shown · non-contrast
Comparison: None.

CLINICAL DATA: Pre MRI screening

ORBITS FOR FOREIGN BODY - 2 VIEW

[w waters (1 of 2)]
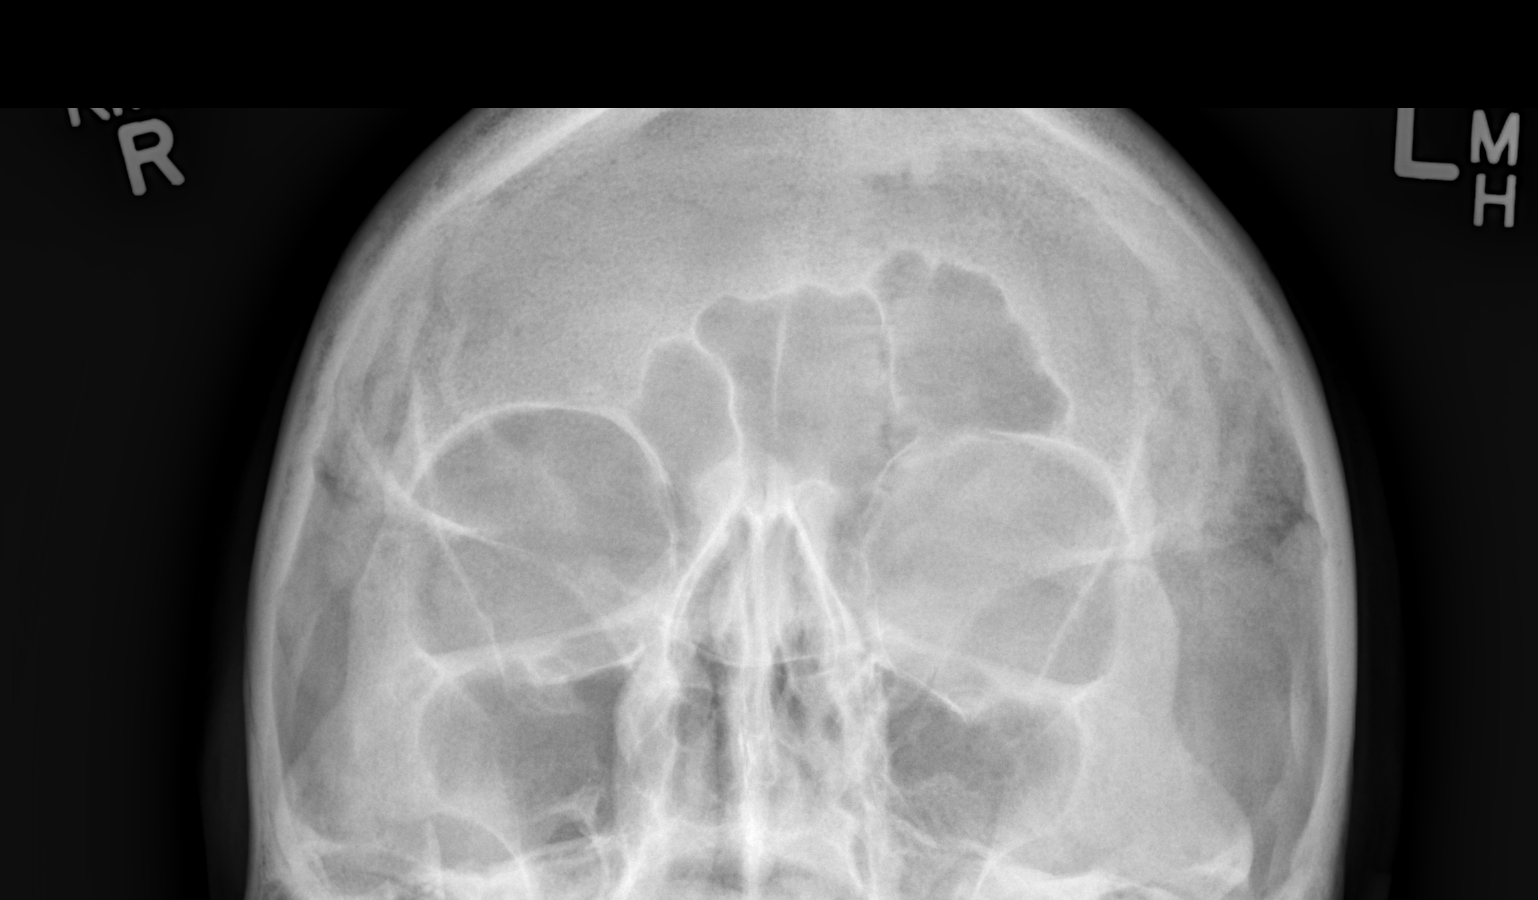

[w waters (2 of 2)]
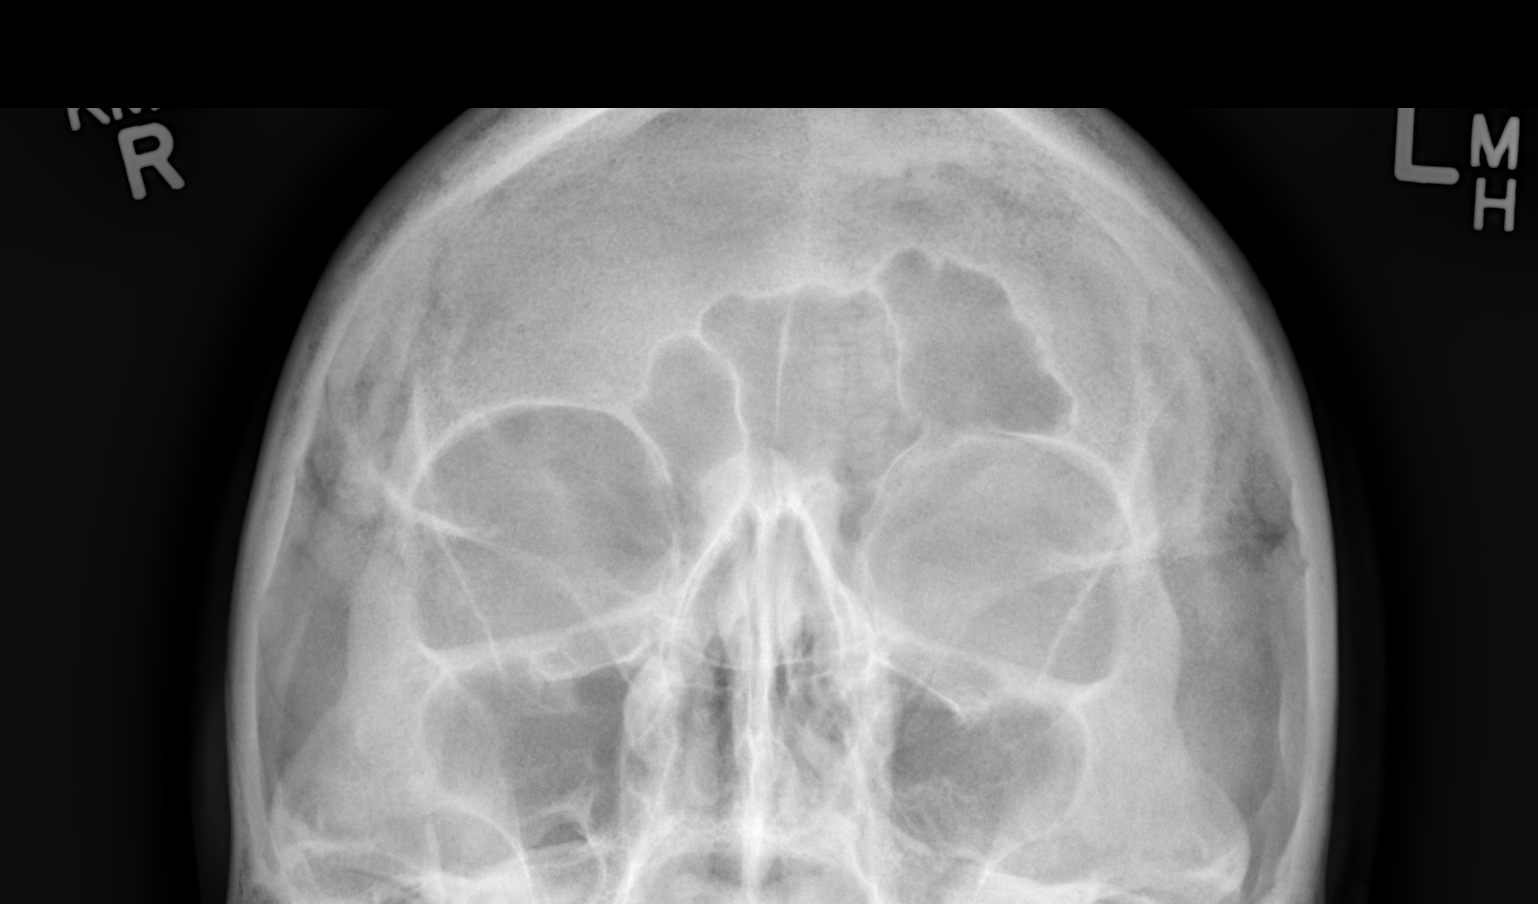

[2 of 2 positions shown; findings below may reference images not displayed]

FINDINGS: Two views of the orbits looking right and left
demonstrate no radiopaque foreign body.  Bony confines of the orbit
are intact.
IMPRESSION: Negative.

## 2015-02-21 IMAGING — CR DG PORTABLE PELVIS
1 series · 1 of 1 positions shown · non-contrast
Comparison: Preoperative radiographs earlier today at [DATE] p.m.

CLINICAL DATA: Right hip revision

PORTABLE PELVIS

[AP]
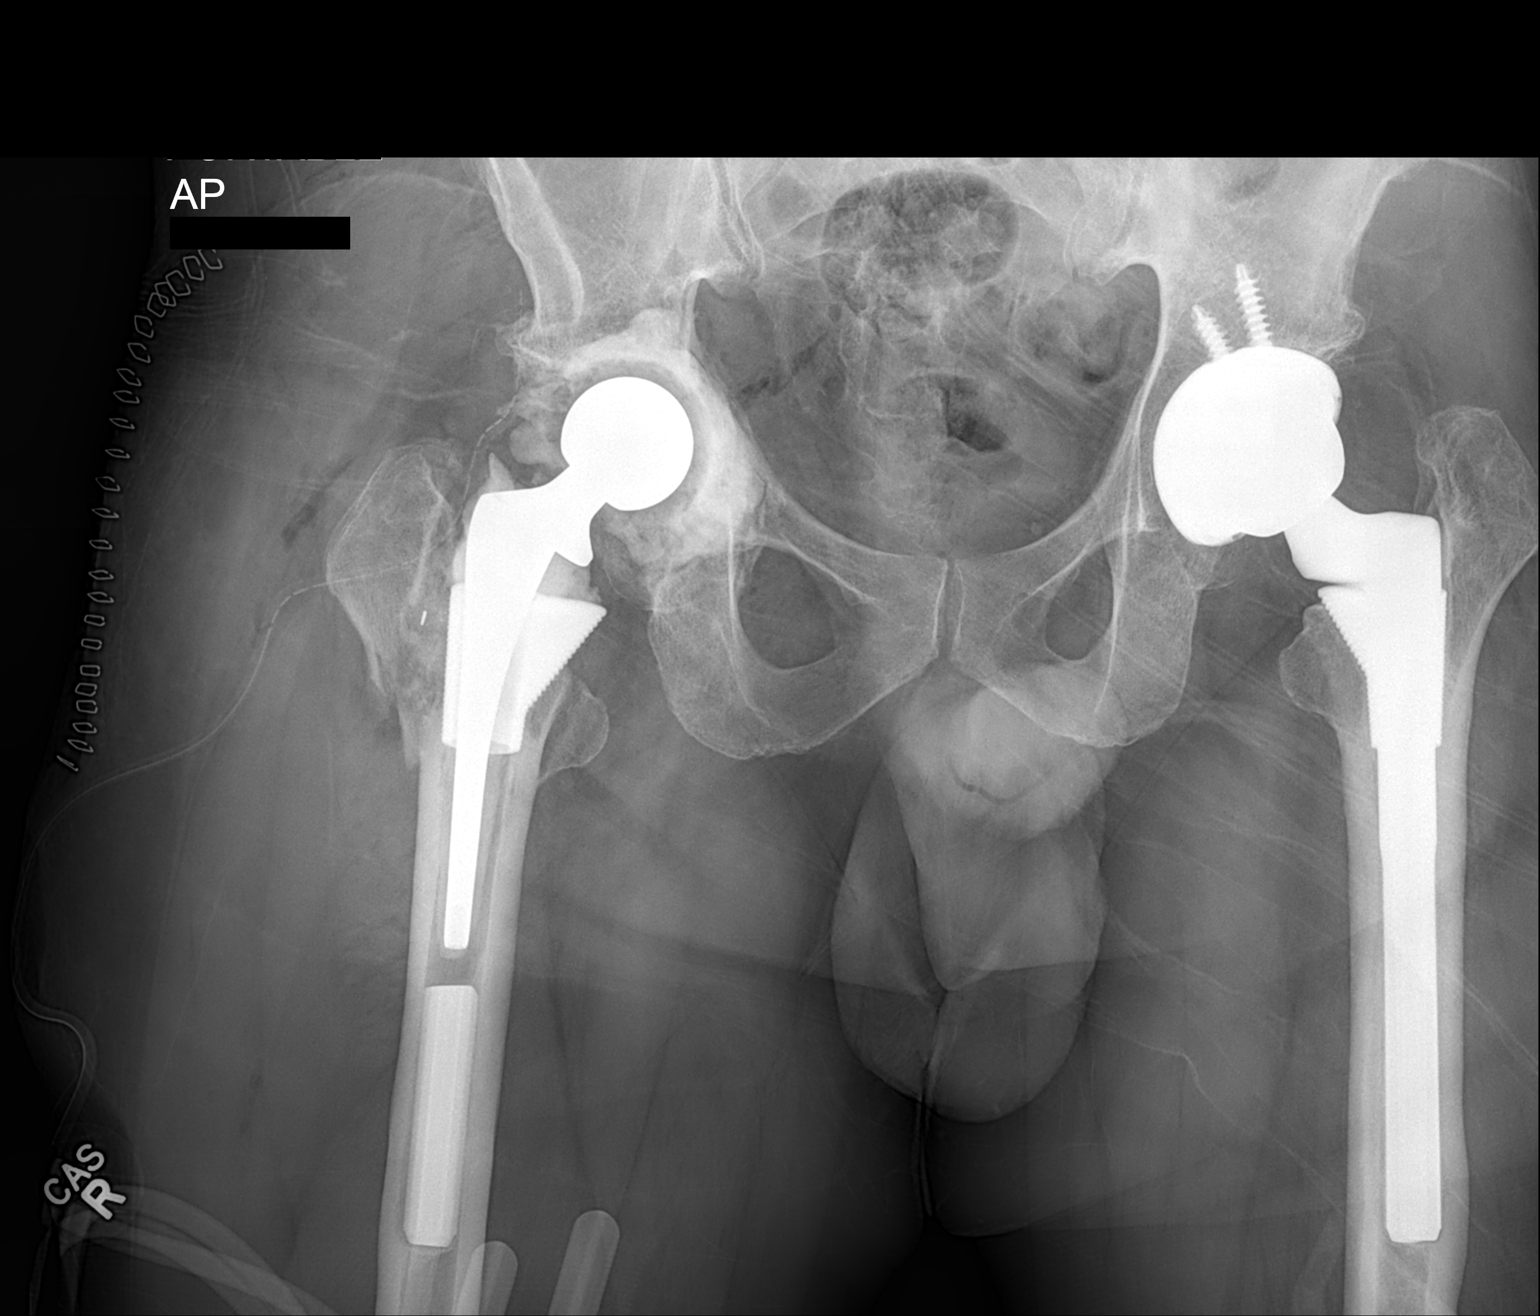

[1 of 1 positions shown; findings below may reference images not displayed]

FINDINGS: Interval surgical changes of right hip revision
arthroplasty.  There is a non radiopaque acetabular spacer imbedded
in radiopaque cement.  New femoral shaft component is present.

Interval development of an acute fracture through the greater
trochanter.  Additionally, there is increased lucency at the lesser
trochanter suggesting involvement at the fracture line.  Findings
are concerning for periprosthetic fracture.  The soft tissue drain
is present as are expected postoperative changes of edema and
subcutaneous emphysema.  The left hip arthroplasty implant is
unchanged.
IMPRESSION: 1.  Interval revision right total hip arthroplasty as above.
2.  New right periprosthetic fracture involving the lesser and
greater trochanters.

to the patient's nurse in the post anesthesia care unit, who
verbally acknowledged these results.

## 2015-04-26 IMAGING — CR DG HIP COMPLETE 2+V*R*
3 series · 3 of 3 positions shown · non-contrast
Comparison: 09/05/2012.

CLINICAL DATA: Resection arthroplasty of the right hip.

RIGHT HIP - COMPLETE 2+ VIEW

[t pelvis a.p.]
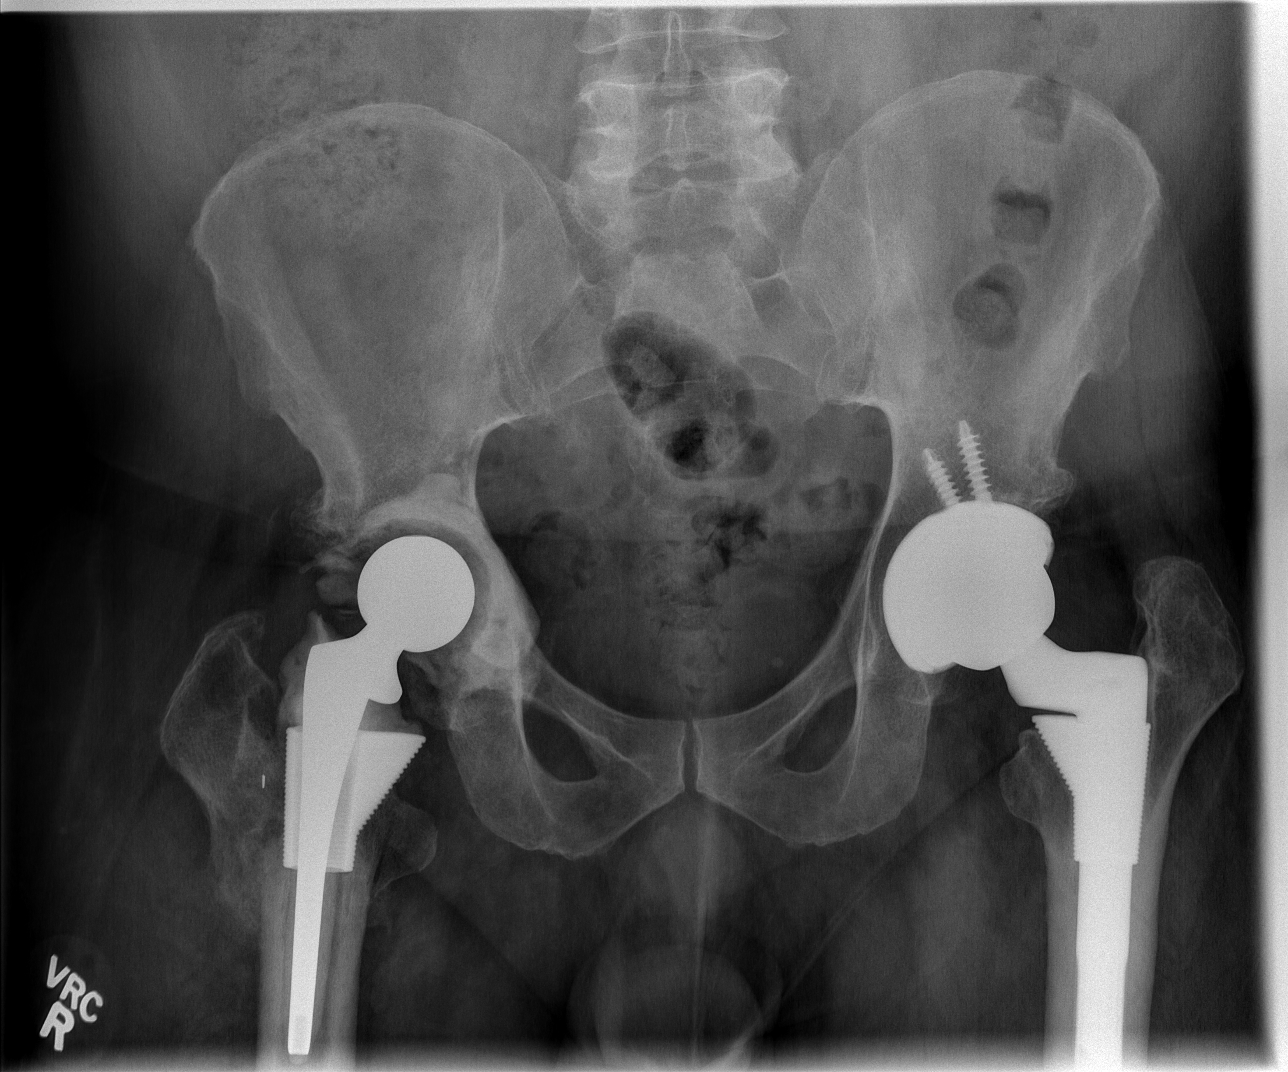

[t hip frog leg right]
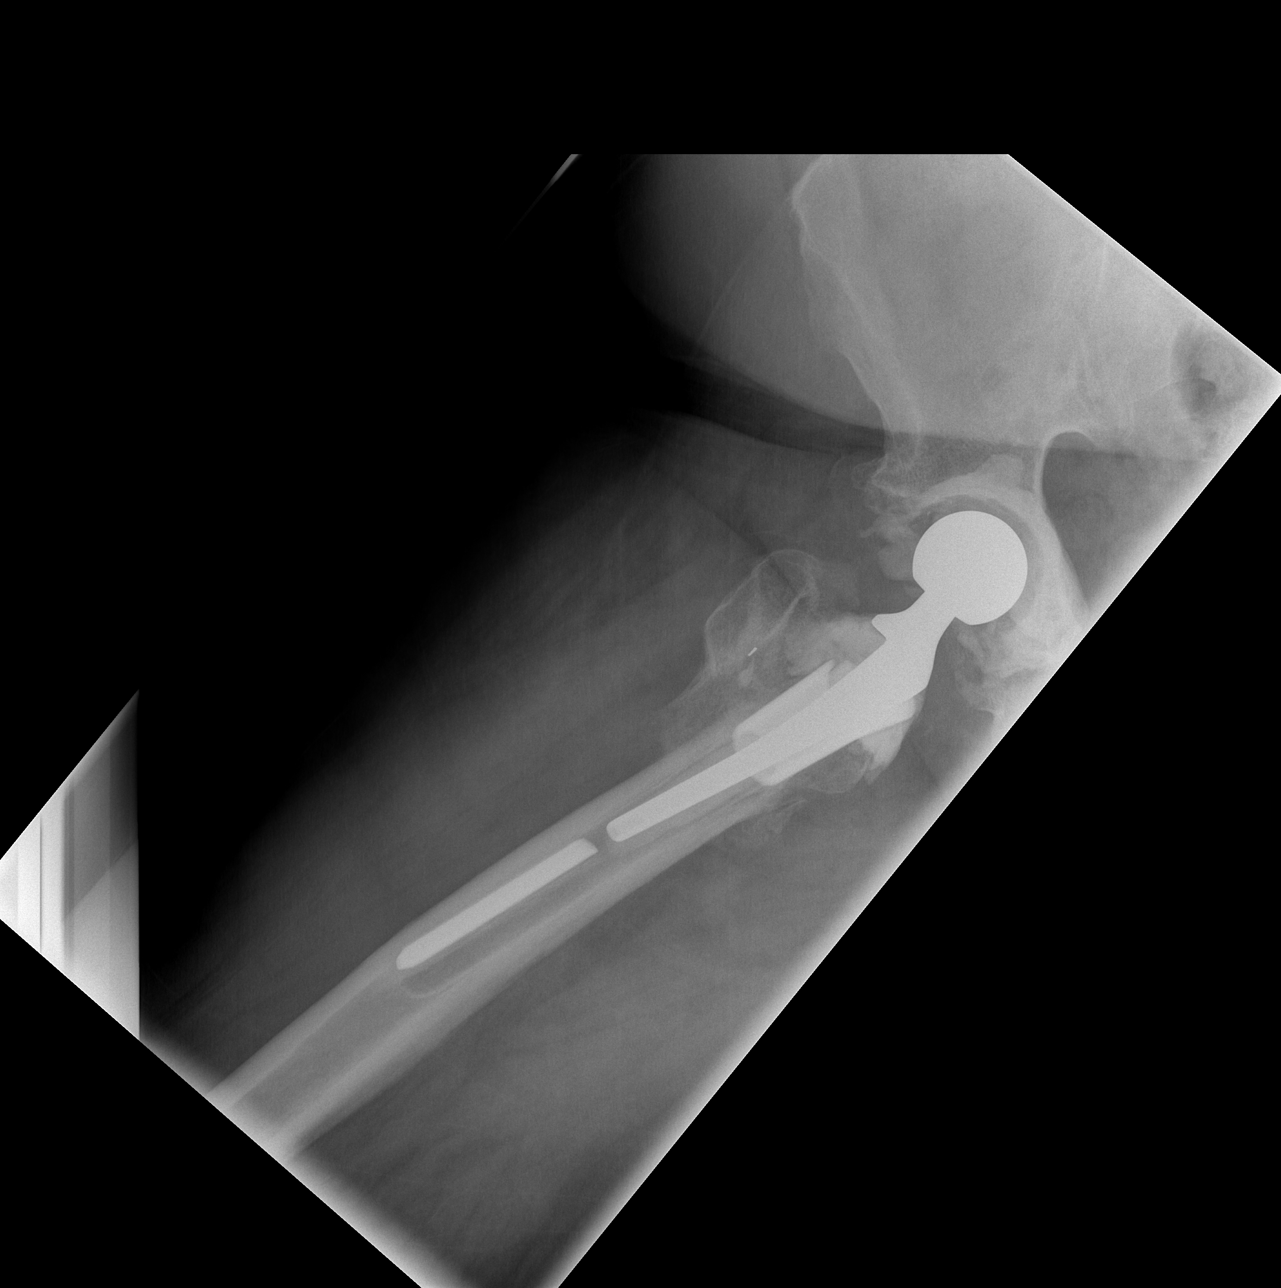

[t hip ap right]
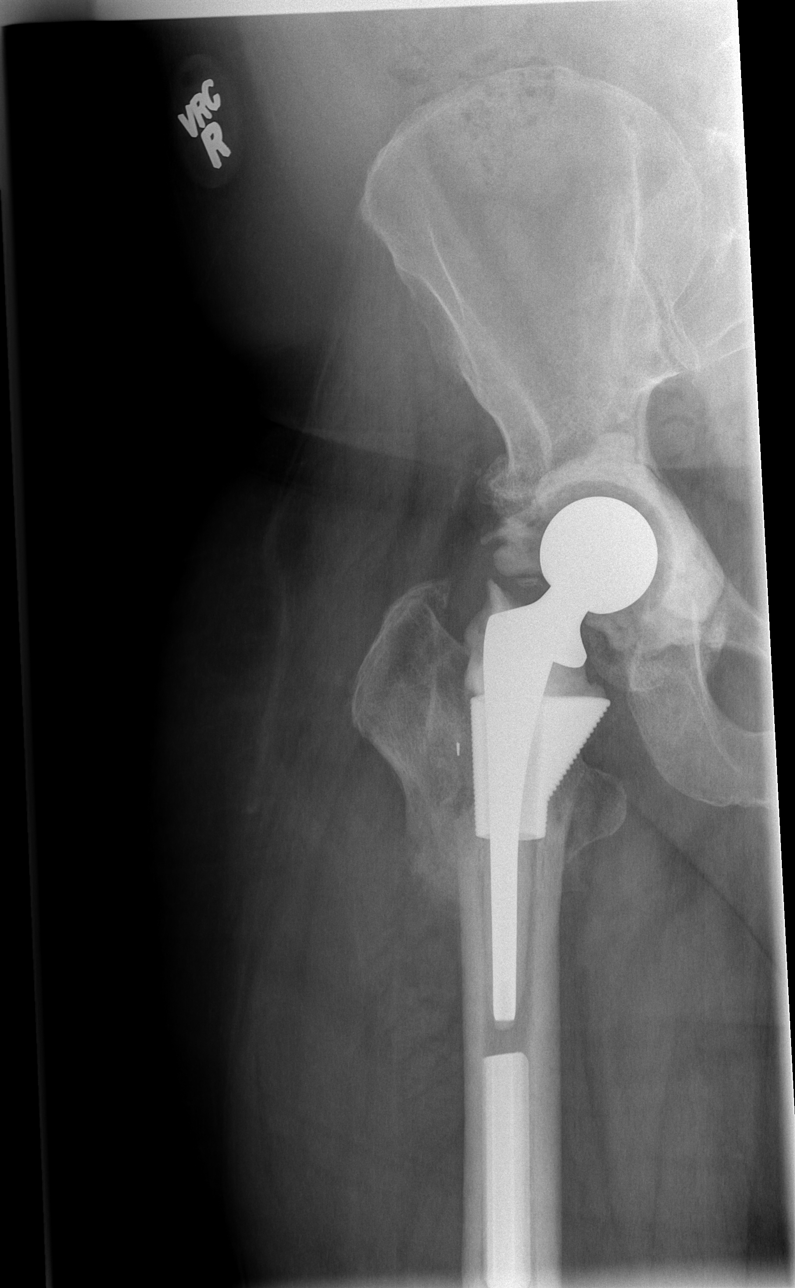

[3 of 3 positions shown; findings below may reference images not displayed]

FINDINGS: Compared to the prior examination, the previously noted
right-sided acetabular cup has been removed.  New increased density
in the right hemi pelvis in the region of the acetabulum presumably
reflects a combination of cement and antibiotic impregnated spacer.
The previously noted femoral stem has been removed, although there
is likely a retained fracture fragment of the stent in the proximal
third of the femoral diaphysis.  There is a new short stem femoral
prosthesis in position.  Compared to the prior study there is now a
healing intertrochanteric fracture of the femur which is mildly
distracted, but there is a abundant new bone formation in this
region.  The prosthetic femoral head resides within the
reconstructed acetabulum at this time.  Postoperative changes of
left total hip arthroplasty are also noted.  The bony pelvis itself
appears intact.
IMPRESSION: 1.  Extensive postoperative changes of resection arthroplasty in
the right hip noted, complicated by periprosthetic
intertrochanteric hip fracture which appears to be healing at this
time.
2.  Left total hip arthroplasty noted.

## 2015-05-18 IMAGING — CR DG PORTABLE PELVIS
1 series · 2 of 2 positions shown · non-contrast
Comparison: In the pelvis x-ray 09/05/2012.

CLINICAL DATA: Postop revision of right hip arthroplasty.

PORTABLE AP PELVIS

[Series 1: AP · U · 2 of 2 slices shown]
[im 1/2]
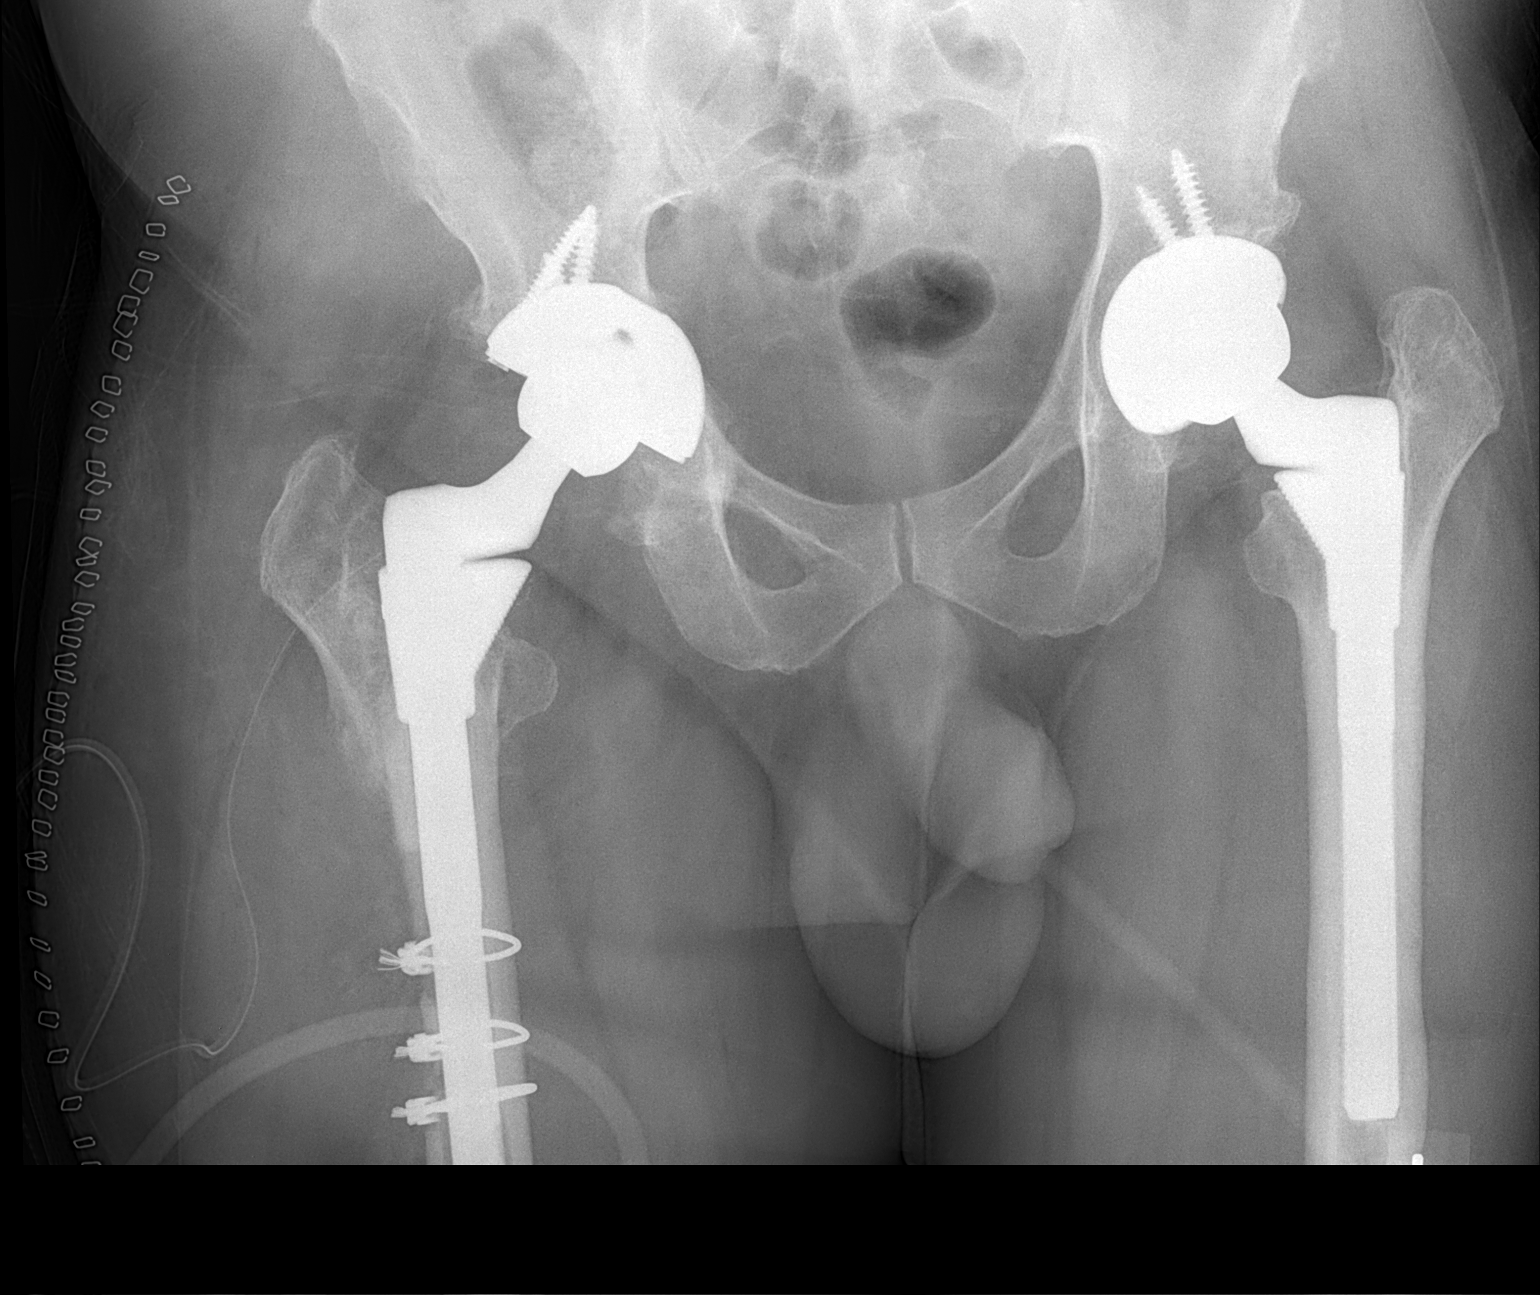
[im 2/2]
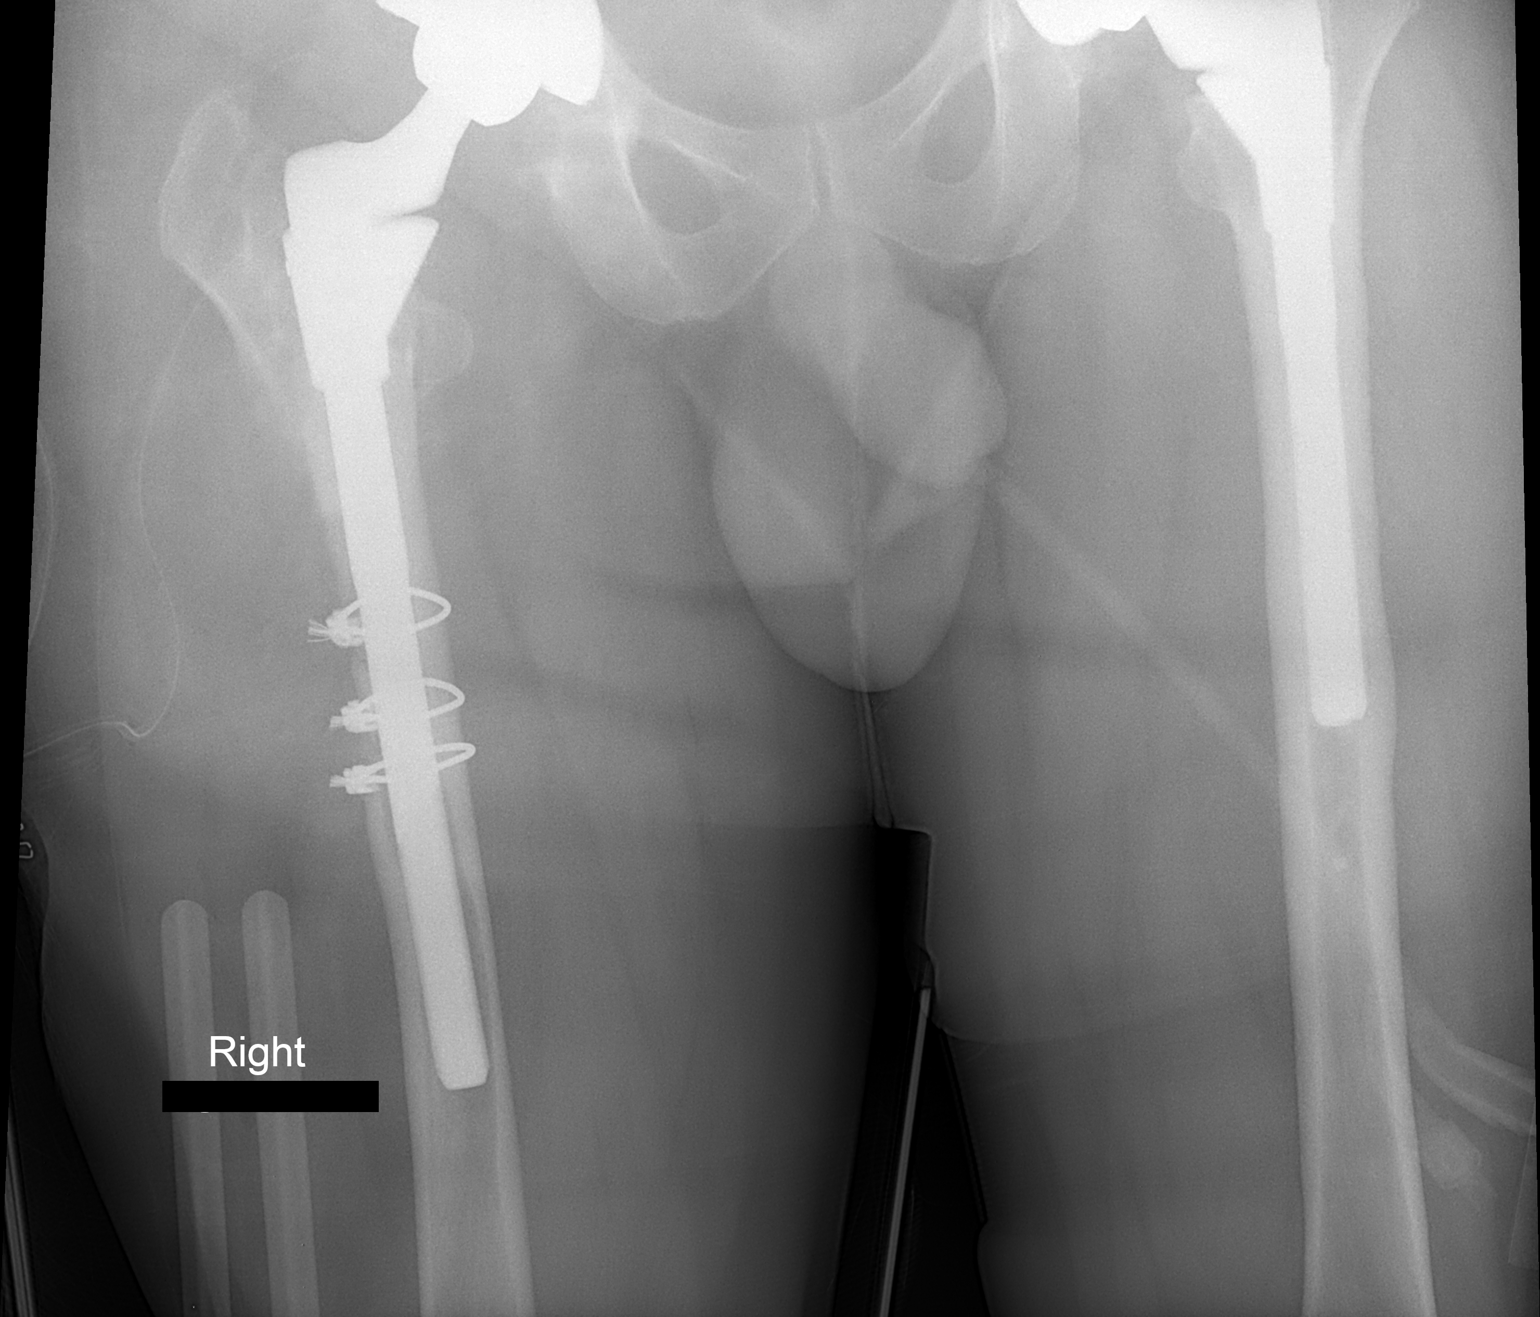

[2 of 2 positions shown; findings below may reference images not displayed]

FINDINGS: Right total hip arthroplasty revision with anatomic
alignment and no visible complicating features.  Interval healing
of the comminuted fractures involving the proximal right femur with
callus formation.  Surgical drain in place.  The previously placed
left hip arthroplasty also demonstrates anatomic alignment.
IMPRESSION: Anatomic alignment post right hip arthroplasty revision without
acute complicating features.  Interval healing of the comminuted
fractures involving the proximal right femur since August 2012.

## 2015-05-22 IMAGING — CR DG HIP COMPLETE 2+V*R*
4 series · 4 of 4 positions shown · non-contrast
Comparison: 10/21/2012

CLINICAL DATA: Right hip dislocation

RIGHT HIP - COMPLETE 2+ VIEW

[x pelvis (1 of 2)]
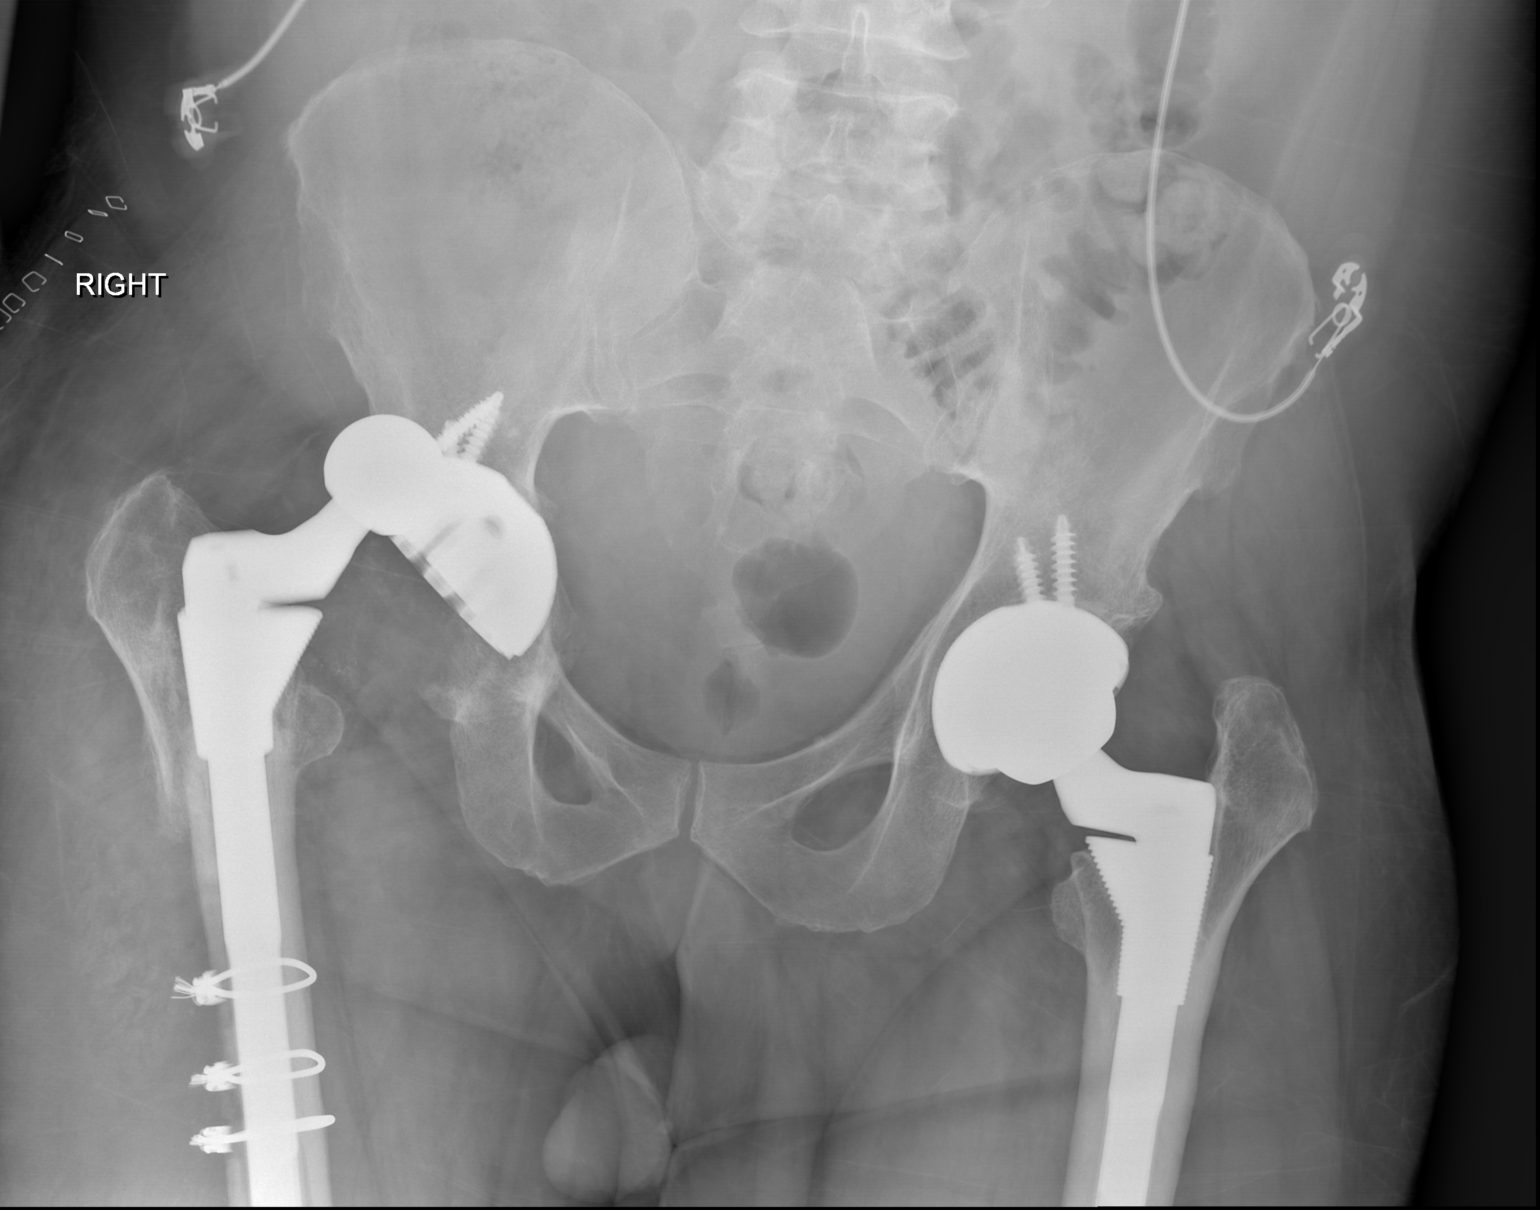

[x pelvis (2 of 2)]
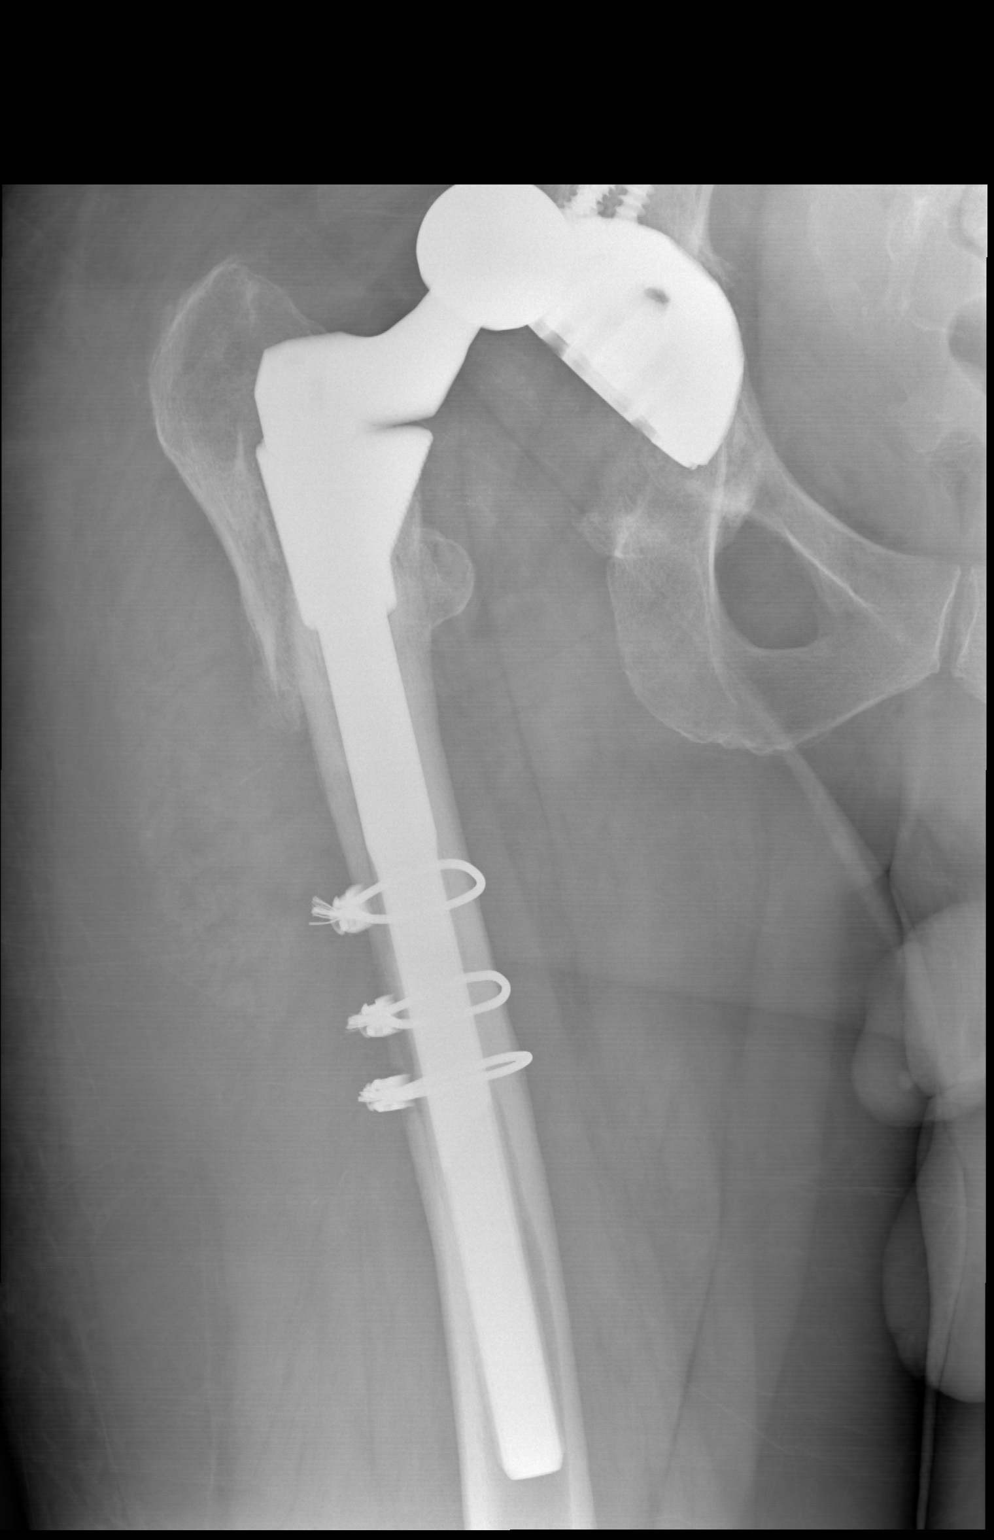

[w hip lat right (1 of 2)]
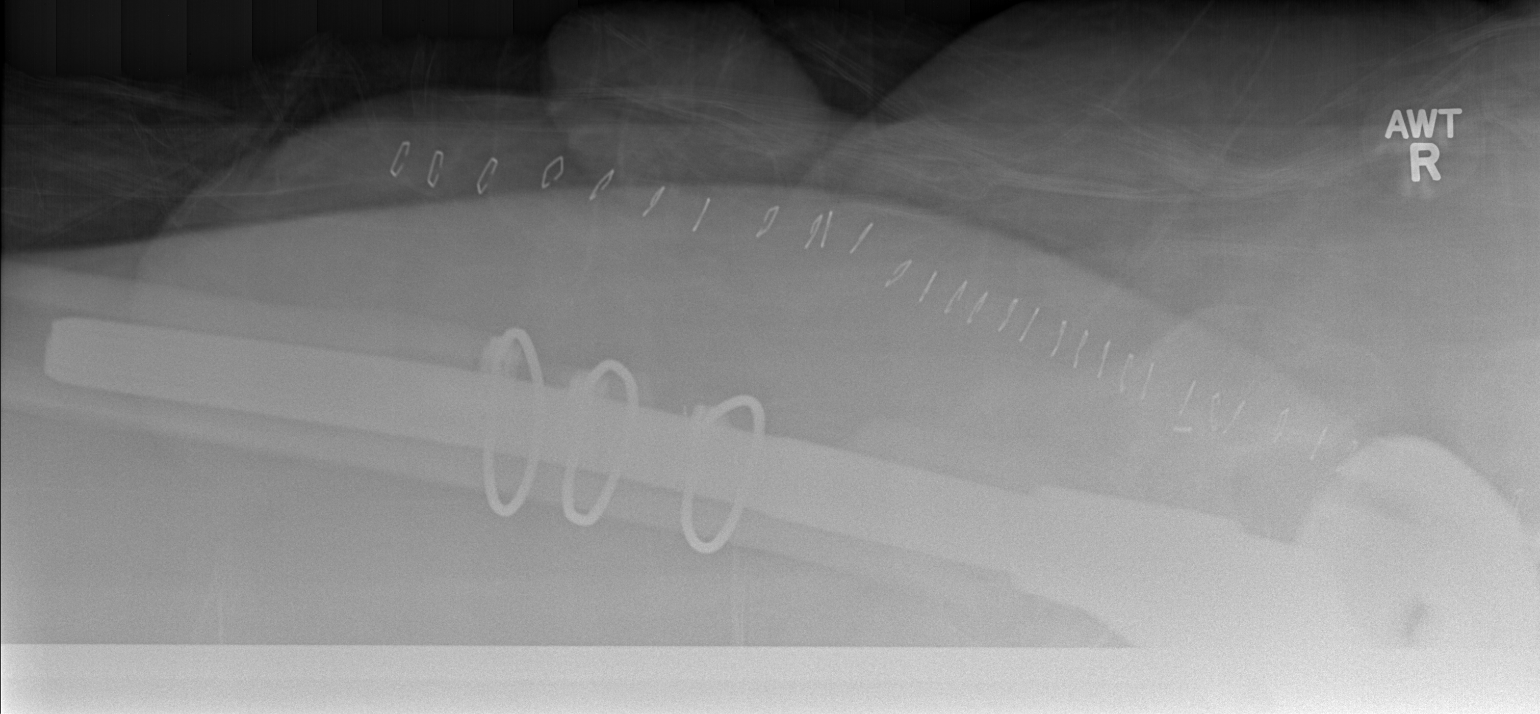

[w hip lat right (2 of 2)]
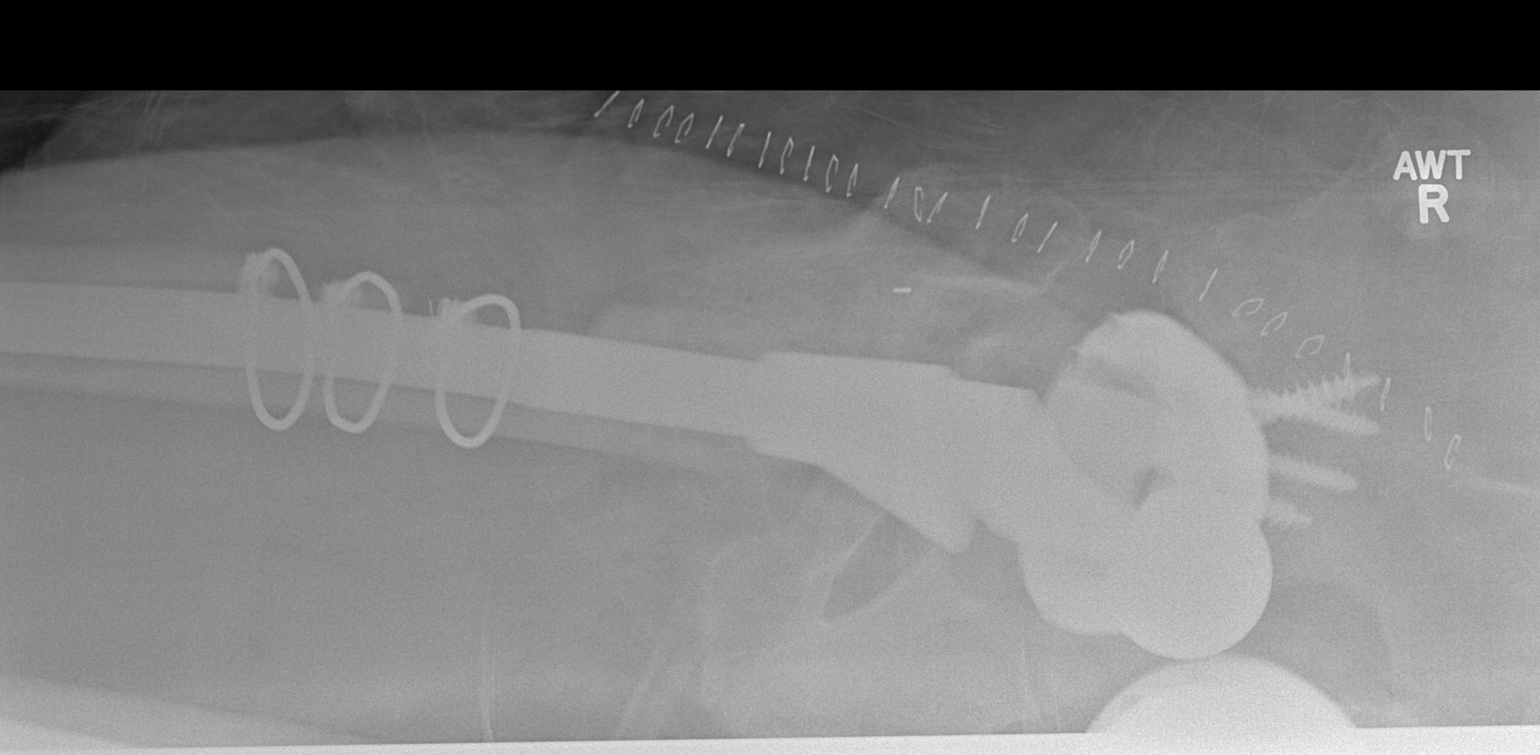

[4 of 4 positions shown; findings below may reference images not displayed]

FINDINGS: The right hip arthroplasty is dislocated superolaterally
and slightly posteriorly relative to the right acetabulum.

Again seen is a fracture line through the intertrochanteric region
of the right femur, lateral to the hip arthroplasty.  There is some
healing with callous formation, similar in appearance to
radiographs of 11/08/2012.  No acute pelvic ring fracture is
identified.  The proximal left hip arthroplasty appears stable.

Skin staples are seen along the lateral aspect of the proximal
right lower extremity.
IMPRESSION: 1.  The right hip arthroplasty is superolaterally and slightly
posteriorly dislocated.
2.  Again noted is a healing fracture of the intertrochanteric
region of the right femur.
3.  Left hip arthroplasty is unremarkable.

## 2015-06-22 IMAGING — CT CT ABD-PELV W/ CM
2 of 5 series · 16 of 46 positions shown, 18 images · IV contrast (APPLIED)
Comparison: 07/23/2011

CLINICAL DATA: Abdominal pain

EXAM:
CT ABDOMEN AND PELVIS WITH CONTRAST
TECHNIQUE: Multidetector CT imaging of the abdomen and pelvis was performed
using the standard protocol following bolus administration of
intravenous contrast.
CONTRAST:  100mL OMNIPAQUE IOHEXOL 300 MG/ML  SOLN

[Series 2: abd/pelvis 5.0 b31f · axial · 0.86mm/px · z∈[-509,-79]mm · 13 of 96 slices shown, 15 images]
[im 5/96  soft-tissue]
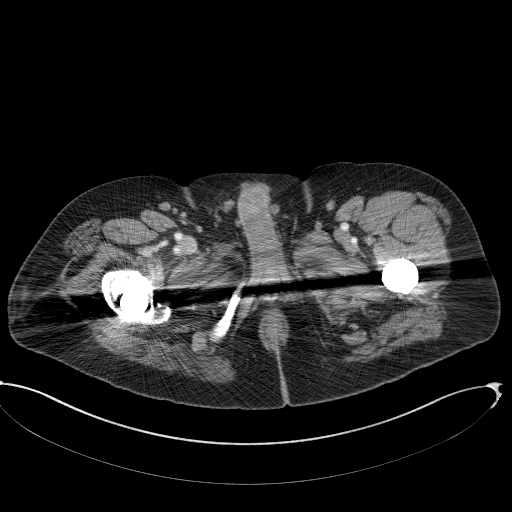
[im 5/96  bone]
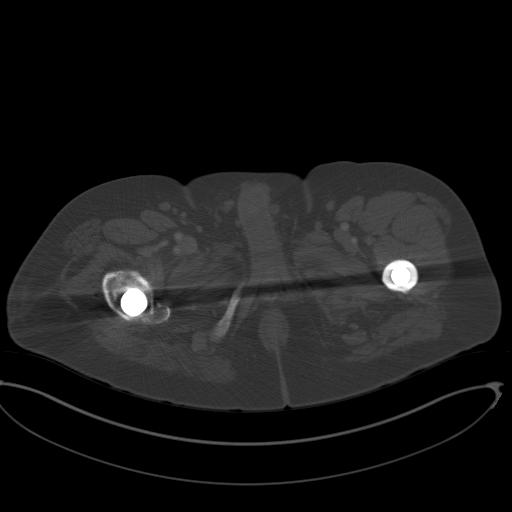
[im 15/96  soft-tissue]
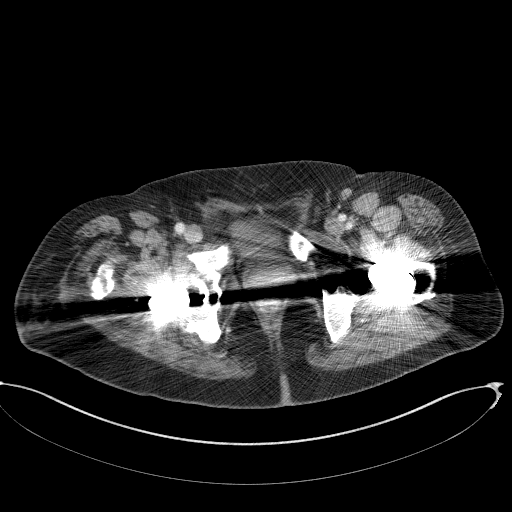
[im 20/96  soft-tissue]
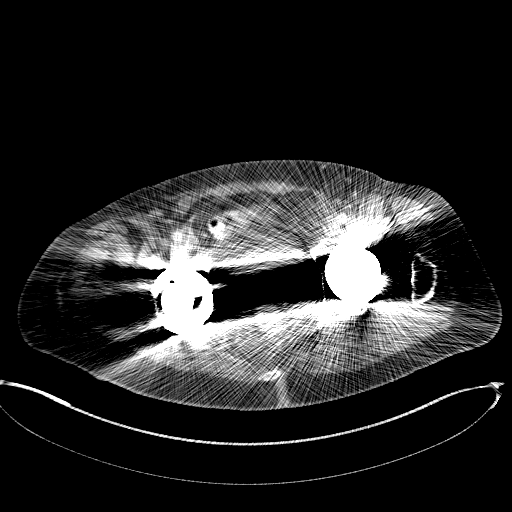
[im 29/96  soft-tissue]
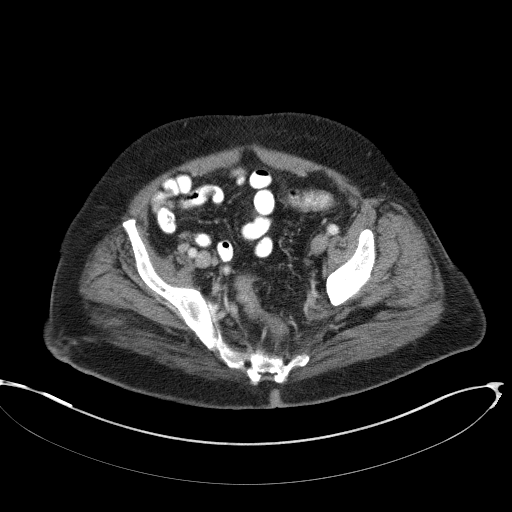
[im 34/96  soft-tissue]
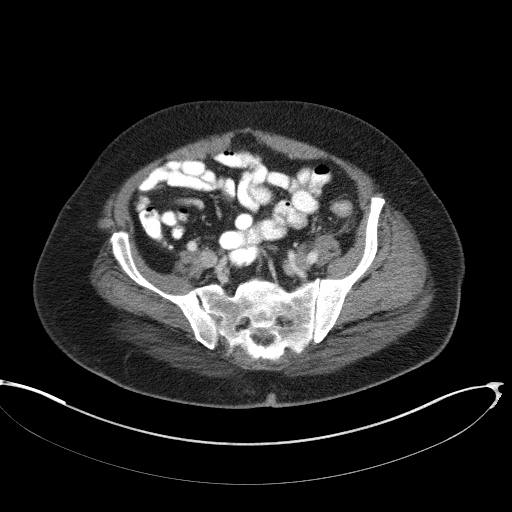
[im 43/96  soft-tissue]
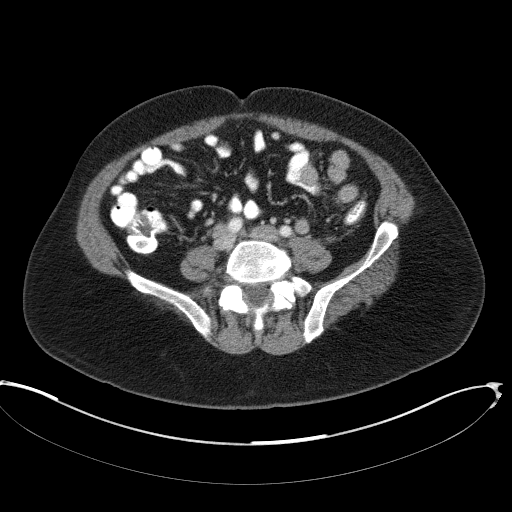
[im 48/96  soft-tissue]
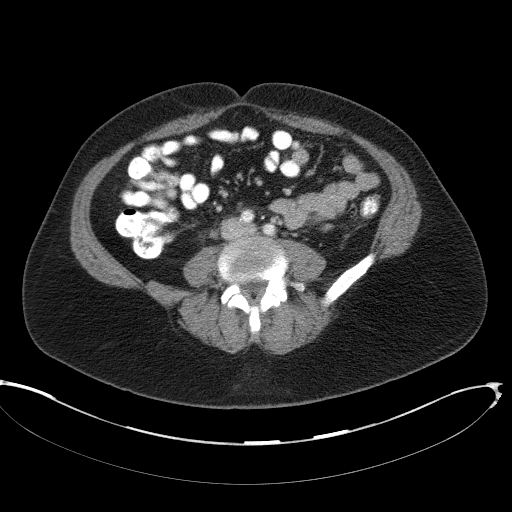
[im 53/96  soft-tissue]
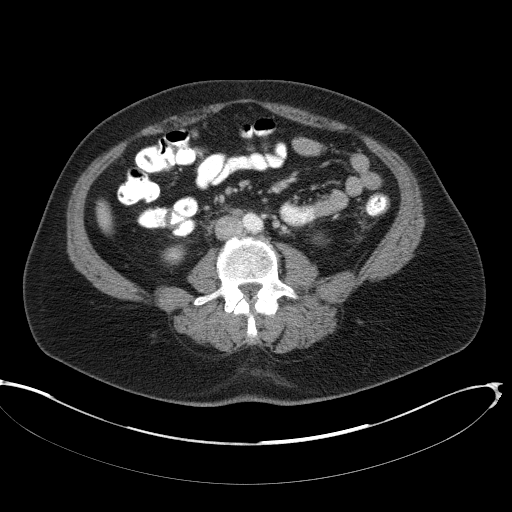
[im 62/96  soft-tissue]
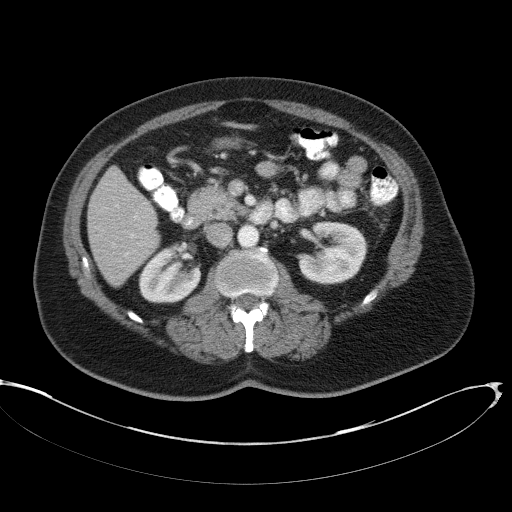
[im 62/96  bone]
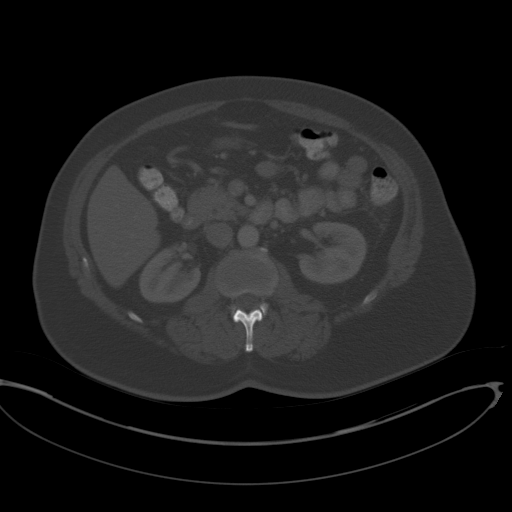
[im 67/96  soft-tissue]
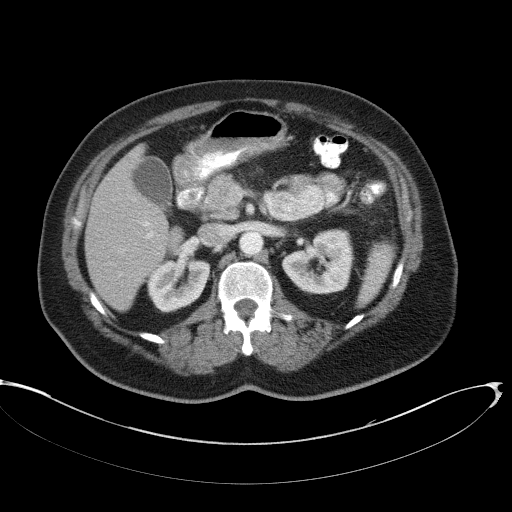
[im 77/96  soft-tissue]
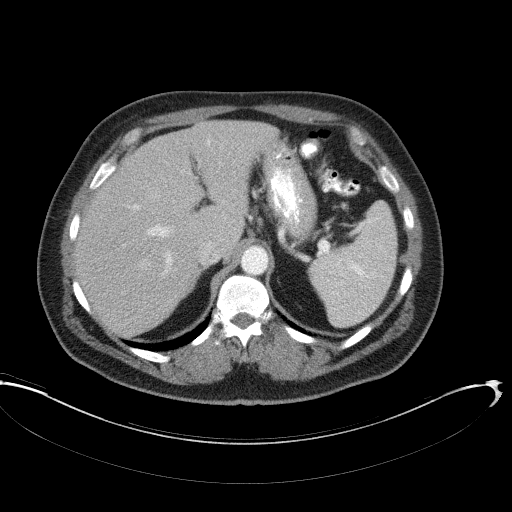
[im 81/96  soft-tissue]
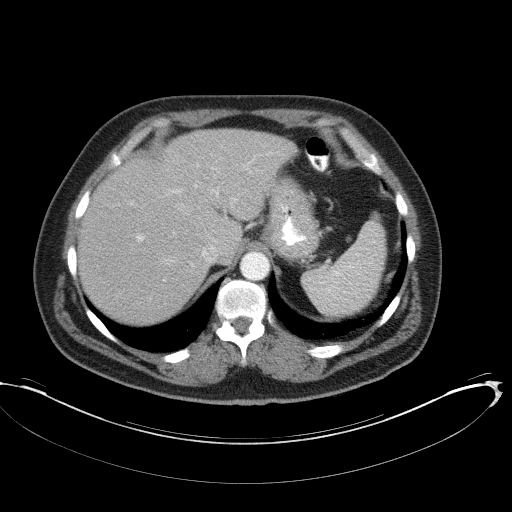
[im 91/96  soft-tissue]
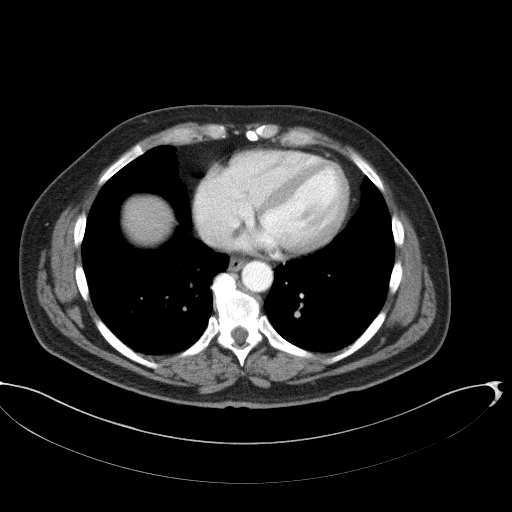

[Series 5: abd/pelvis 3.0 coronal · coronal · 0.99mm/px · 3 of 90 slices shown]
[im 30/90  soft-tissue]
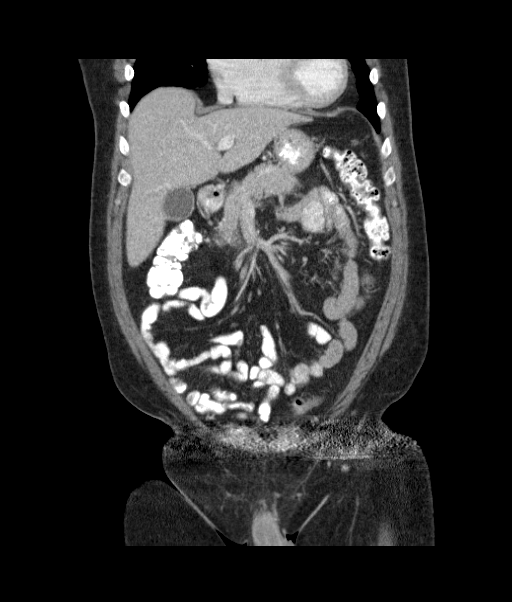
[im 40/90  soft-tissue]
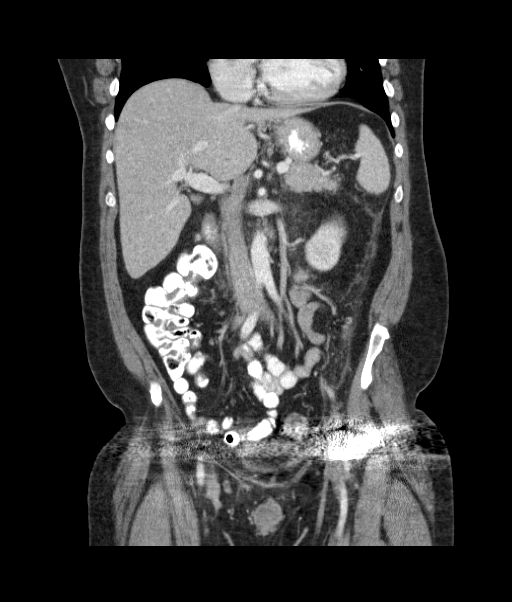
[im 50/90  soft-tissue]
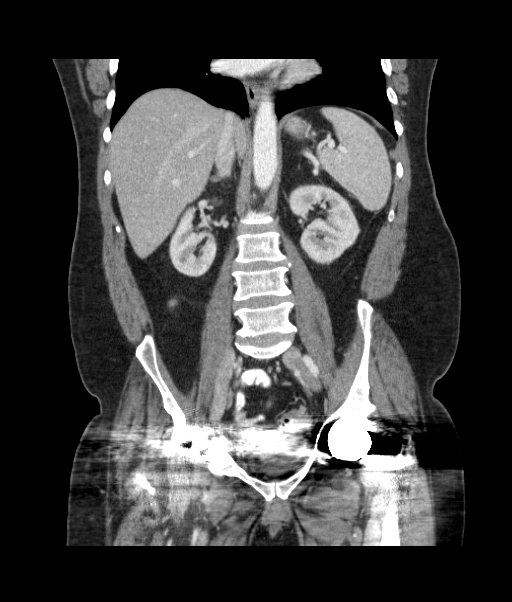

[16 of 46 positions shown; findings below may reference images not displayed]

FINDINGS: Mild subpleural reticulation/dependent atelectasis at the lung
bases.

Liver, spleen, and adrenal glands are within normal limits.

Mild peripancreatic stranding along the body and tail (series 2/
image 26), raising the possibility of acute pancreatitis. No
associated main pancreatic ductal dilatation or definite pancreatic
mass. No drainable fluid collection/pseudocyst.

Gallbladder is unremarkable. No intrahepatic or extrahepatic ductal
dilatation.

Kidneys are within normal limits. No hydronephrosis.

No evidence of bowel obstruction. Normal appendix. Colonic
diverticulosis, without associated inflammatory changes.

No evidence of abdominal aortic aneurysm.

No abdominopelvic ascites.

No suspicious abdominopelvic lymphadenopathy.

Streak artifact obscures the pelvis.

Prostate and bladder are not well visualized.

Mild degenerative changes of the visualized thoracolumbar spine.
Bilateral total hip arthroplasties.
IMPRESSION: Mild peripancreatic stranding along the body and tail, raising the
possibility of acute pancreatitis. Laboratory correlation is
suggested.

No drainable fluid collection/pseudocyst.

Otherwise, no CT findings to account for the patient's abdominal
pain.

## 2016-11-09 ENCOUNTER — Telehealth: Payer: Self-pay

## 2016-11-09 NOTE — Telephone Encounter (Signed)
Pre visit call completd.

## 2016-11-10 ENCOUNTER — Encounter: Payer: Self-pay | Admitting: Medical

## 2016-11-10 ENCOUNTER — Telehealth: Payer: Self-pay | Admitting: Medical

## 2016-11-10 ENCOUNTER — Ambulatory Visit (INDEPENDENT_AMBULATORY_CARE_PROVIDER_SITE_OTHER): Payer: BLUE CROSS/BLUE SHIELD | Admitting: Medical

## 2016-11-10 VITALS — BP 155/90 | HR 68 | Temp 98.3°F | Resp 16 | Ht 66.0 in | Wt 206.4 lb

## 2016-11-10 DIAGNOSIS — N529 Male erectile dysfunction, unspecified: Secondary | ICD-10-CM

## 2016-11-10 DIAGNOSIS — R6882 Decreased libido: Secondary | ICD-10-CM | POA: Diagnosis not present

## 2016-11-10 DIAGNOSIS — R5383 Other fatigue: Secondary | ICD-10-CM | POA: Diagnosis not present

## 2016-11-10 DIAGNOSIS — Z0001 Encounter for general adult medical examination with abnormal findings: Secondary | ICD-10-CM | POA: Diagnosis not present

## 2016-11-10 DIAGNOSIS — Z1159 Encounter for screening for other viral diseases: Secondary | ICD-10-CM | POA: Diagnosis not present

## 2016-11-10 DIAGNOSIS — Z23 Encounter for immunization: Secondary | ICD-10-CM | POA: Diagnosis not present

## 2016-11-10 DIAGNOSIS — L989 Disorder of the skin and subcutaneous tissue, unspecified: Secondary | ICD-10-CM

## 2016-11-10 DIAGNOSIS — Z0000 Encounter for general adult medical examination without abnormal findings: Secondary | ICD-10-CM

## 2016-11-10 MED ORDER — SILDENAFIL CITRATE 100 MG PO TABS: 50.0000 mg | ORAL_TABLET | Freq: Every day | ORAL | 0 refills | Status: DC | PRN

## 2016-11-10 NOTE — Progress Notes (Signed)
Subjective:    Patient ID: Steve [name], male    DOB: 06-29-1955, 61 y.o.   MRN: [id number]  HPI  Pt her to establish care. Years ago was pt of Dr. Etter Sjogren.  Pt works Scientist, water quality), pt does not exercise regularly, he estimates walking at work 3-5 miles a day. Pt admits not healthy diet. Married- 4 children.(35 yo, 6 yo, 42 yo and 43 yo). 2nd wife 63 yo and 5 yo.  Pt updates me history of tooth infection 4 years ago  that caused infection in rt hip replacement. This caused him to get revisions.   Occasional reflux. Not daily. States maybe one day a month at most.  Pt has hx of sleep apnea. He had a cpap machine. Pt states has not use it for 7-8 years. He can't remember who specialist was who saw him. He will investigate who he saw. Pt knows he still snores.When he gives me name of MD will refer back to that specialist.  Pt states maybe 7 years since he had CPE.    Review of Systems  Constitutional: Positive for fatigue. Negative for chills and fever.  HENT: Negative for congestion, ear discharge, ear pain and facial swelling.   Respiratory: Negative for cough, chest tightness, shortness of breath and wheezing.        Snores.  Cardiovascular: Negative for chest pain and palpitations.  Gastrointestinal: Negative for abdominal distention, abdominal pain, constipation, diarrhea and nausea.       Rare reflux.  Genitourinary: Negative for difficulty urinating, dysuria, flank pain, frequency, penile pain, penile swelling, scrotal swelling and urgency.       ED. Gets erection but hard to maintain.  Does not have  morning erections.    Musculoskeletal: Negative for back pain, myalgias, neck pain and neck stiffness.       No hip pain.  Skin: Negative for rash.  Neurological: Negative for dizziness, weakness, numbness and headaches.  Hematological: Negative for adenopathy. Does not bruise/bleed easily.  Psychiatric/Behavioral: Negative for behavioral  problems.    Past Medical History:  Diagnosis Date  . Abscess of muscle 08/10/2011   staph infection of right hip   . GERD (gastroesophageal reflux disease)    rare reflux - no meds for reflux - NO PROBLEM IN PAST SEVERAL YRS  . Hip dysplasia, congenital    no surgery as a child for hip dysplasia - has had bilateral hip replacements as an adult  . Pancreatitis   . Postoperative anemia due to acute blood loss 09/07/2012  . Septic arthritis of hip ([place]) 09/05/2012   PT'S TOTAL HIP JOINT REMOVED - ANTIBIOTIC SPACE PLACED AND PT HAS FINISHED IV ANTIBIOTICS ( PICC LINE REMOVED)  . Sleep apnea    USES CPAP     Social History   Social History  . Marital status: Married    Spouse name: N/A  . Number of children: N/A  . Years of education: N/A   Occupational History  . Not on file.   Social History Main Topics  . Smoking status: Never Smoker  . Smokeless tobacco: Never Used  . Alcohol use No     Comment: rare beer  . Drug use: No  . Sexual activity: Yes    Partners: Female   Other Topics Concern  . 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 COLONOSCOPY  04/26/2007  . HERNIA REPAIR     inguinal hernia x3  . JOINT REPLACEMENT  2002 & 2007   bilateral hip replacement  . MULTIPLE EXTRACTIONS WITH ALVEOLOPLASTY  07/28/2011   Procedure: MULTIPLE EXTRACION WITH ALVEOLOPLASTY;  Surgeon: Lenn Cal, DDS;  Location: [facility];  Service: Oral Surgery;  Laterality: N/A;  Extraction of tooth #'s 2,3,4,5,6,11,12,13,15,19,22 with alveoloplasty.  . shoulder repair - right for separation of shoulder    . TEE WITHOUT CARDIOVERSION  07/29/2011   Procedure: TRANSESOPHAGEAL ECHOCARDIOGRAM (TEE);  Surgeon: Josue Hector, MD;  Location: [facility];  Service: Cardiovascular;  Laterality: N/A;  . TOTAL HIP REVISION Right 09/05/2012   Procedure: RIGHT HIP  RESECTION ARTHROPLASTY WITH ANTIBIOTIC SPACERS;  Surgeon: [name], MD;  Location: [facility];  Service: Orthopedics;  Laterality: Right;  . TOTAL HIP REVISION Right 11/30/2012   Procedure: RIGHT TOTAL HIP ARTHROPLASTY REIMPLANTATION;  Surgeon: [name], MD;  Location: [facility];  Service: Orthopedics;  Laterality: Right;    Family History  Problem Relation Age of Onset  . Melanoma Mother   . Heart attack Mother   . Hyperlipidemia Neg Hx   . Sudden death Neg Hx   . Hypertension Neg Hx   . Diabetes Neg Hx     No Known Allergies  No current outpatient prescriptions on file prior to visit.   No current facility-administered medications on file prior to visit.     Pulse 68   Temp 98.3 F (36.8 C) (Oral)   Resp 16   Ht 5\' 6"  (1.676 m)   Wt 206 lb 6.4 oz (93.6 kg)   SpO2 95%   BMI 33.31 kg/m       Objective:   Physical Exam  General Mental Status- Alert. General Appearance- Not in acute distress.   Skin General: Color- Normal Color. Moisture- Normal Moisture.Large(mole/lesion left flank). Oblong shape. Pt thinks some change and increase in size,  Neck Carotid Arteries- Normal color. Moisture- Normal Moisture. No carotid bruits. No JVD.  Chest and Lung Exam Auscultation: Breath Sounds:-Normal.  Cardiovascular Auscultation:Rythm- Regular. Murmurs & Other Heart Sounds:Auscultation of the heart reveals- No Murmurs.  Abdomen Inspection:-Inspeection Normal. Palpation/Percussion:Note:No mass. Palpation and Percussion of the abdomen reveal- Non Tender, Non Distended + BS, no rebound or guarding.   Neurologic Cranial Nerve exam:- CN III-XII intact(No nystagmus), symmetric smile. Strength:- 5/5 equal and symmetric strength both upper and lower extremities.   HEENT Head- Normal. Ear Auditory Canal - Left- Normal. Right - Normal.Tympanic Membrane- Left- Normal. Right- Normal. Eye Sclera/Conjunctiva- Left- Normal. Right- Normal. Nose & Sinuses Nasal Mucosa-  Left-   Not Boggy and Congested. Right-   Not Boggy and  Congested.Bilateral no  maxillary and no  frontal sinus pressure. Mouth & Throat Lips: Upper Lip- Normal: no dryness, cracking, pallor, cyanosis, or vesicular eruption. Lower Lip-Normal: no dryness, cracking, pallor, cyanosis or vesicular eruption. Buccal Mucosa- Bilateral- No Aphthous ulcers. Oropharynx- No Discharge or Erythema. Tonsils: Characteristics- Bilateral- No Erythema or Congestion. Size/Enlargement- Bilateral- No enlargement. Discharge- bilateral-None.  gential exam- normal testicles. No pain. No lumps. No hernia.  Rectal- normal sphincter tone. Smooth prostate,no mass, no lumps. Normal feel.     Assessment & Plan:  For you wellness exam today I have ordered cbc, cmp, tsh, lipid panel, ua and hiv.  Vaccine given tdap  Recommend exercise and healthy diet.  We will let you know lab results as they come in.  For erectile dysfunction will  rx viagra.  For low libido will get testosterone panel  Follow up date appointment will be determined after lab review.   On day you get lab also get nurse bp check. If still elevated would need bp med.  Also please get name of MD you saw for sleep apnea and will refer you back to them.  Will refer you to specialist for skin lesion.    Iktan Aikman, Percell Miller, PA-C

## 2016-11-10 NOTE — Telephone Encounter (Signed)
Will you refer pt to dermatologist. See referral. Can he be seen in 3 weeks or sooner. Will you notify me when he is scheduled.

## 2016-11-10 NOTE — Patient Instructions (Addendum)
For you wellness exam today I have ordered cbc, cmp, tsh, lipid panel, ua and hiv.  Vaccine given tdap  Recommend exercise and healthy diet.  We will let you know lab results as they come in.  For erectile dysfunction will rx viagra.  For low libido will get testosterone panel  Follow up date appointment will be determined after lab review.   On day you get lab also get nurse bp check. If still elevated would need bp med.  Also please get name of MD you saw for sleep apnea and will refer you back to them.  Will refer you to specialist for skin lesion.   Preventive Care 40-64 Years, Male Preventive care refers to lifestyle choices and visits with your health care provider that can promote health and wellness. What does preventive care include?  A yearly physical exam. This is also called an annual well check.  Dental exams once or twice a year.  Routine eye exams. Ask your health care provider how often you should have your eyes checked.  Personal lifestyle choices, including: ? Daily care of your teeth and gums. ? Regular physical activity. ? Eating a healthy diet. ? Avoiding tobacco and drug use. ? Limiting alcohol use. ? Practicing safe sex. ? Taking low-dose aspirin every day starting at age 4. What happens during an annual well check? The services and screenings done by your health care provider during your annual well check will depend on your age, overall health, lifestyle risk factors, and family history of disease. Counseling Your health care provider may ask you questions about your:  Alcohol use.  Tobacco use.  Drug use.  Emotional well-being.  Home and relationship well-being.  Sexual activity.  Eating habits.  Work and work Statistician.  Screening You may have the following tests or measurements:  Height, weight, and BMI.  Blood pressure.  Lipid and cholesterol levels. These may be checked every 5 years, or more frequently if you are  over 47 years old.  Skin check.  Lung cancer screening. You may have this screening every year starting at age 56 if you have a 30-pack-year history of smoking and currently smoke or have quit within the past 15 years.  Fecal occult blood test (FOBT) of the stool. You may have this test every year starting at age 70.  Flexible sigmoidoscopy or colonoscopy. You may have a sigmoidoscopy every 5 years or a colonoscopy every 10 years starting at age 48.  Prostate cancer screening. Recommendations will vary depending on your family history and other risks.  Hepatitis C blood test.  Hepatitis B blood test.  Sexually transmitted disease (STD) testing.  Diabetes screening. This is done by checking your blood sugar (glucose) after you have not eaten for a while (fasting). You may have this done every 1-3 years.  Discuss your test results, treatment options, and if necessary, the need for more tests with your health care provider. Vaccines Your health care provider may recommend certain vaccines, such as:  Influenza vaccine. This is recommended every year.  Tetanus, diphtheria, and acellular pertussis (Tdap, Td) vaccine. You may need a Td booster every 10 years.  Varicella vaccine. You may need this if you have not been vaccinated.  Zoster vaccine. You may need this after age 63.  Measles, mumps, and rubella (MMR) vaccine. You may need at least one dose of MMR if you were born in 1957 or later. You may also need a second dose.  Pneumococcal 13-valent conjugate (PCV13) vaccine.  You may need this if you have certain conditions and have not been vaccinated.  Pneumococcal polysaccharide (PPSV23) vaccine. You may need one or two doses if you smoke cigarettes or if you have certain conditions.  Meningococcal vaccine. You may need this if you have certain conditions.  Hepatitis A vaccine. You may need this if you have certain conditions or if you travel or work in places where you may be  exposed to hepatitis A.  Hepatitis B vaccine. You may need this if you have certain conditions or if you travel or work in places where you may be exposed to hepatitis B.  Haemophilus influenzae type b (Hib) vaccine. You may need this if you have certain risk factors.  Talk to your health care provider about which screenings and vaccines you need and how often you need them. This information is not intended to replace advice given to you by your health care provider. Make sure you discuss any questions you have with your health care provider. Document Released: 04/24/2015 Document Revised: 12/16/2015 Document Reviewed: 01/27/2015 Elsevier Interactive Patient Education  2017 [place] DASH stands for "Dietary Approaches to Stop Hypertension." The DASH eating plan is a healthy eating plan that has been shown to reduce high blood pressure (hypertension). It may also reduce your risk for type 2 diabetes, heart disease, and stroke. The DASH eating plan may also help with weight loss. What are tips for following this plan? General guidelines  Avoid eating more than 2,300 mg (milligrams) of salt (sodium) a day. If you have hypertension, you may need to reduce your sodium intake to 1,500 mg a day.  Limit alcohol intake to no more than 1 drink a day for nonpregnant women and 2 drinks a day for men. One drink equals 12 oz of beer, 5 oz of wine, or 1 oz of hard liquor.  Work with your health care provider to maintain a healthy body weight or to lose weight. Ask what an ideal weight is for you.  Get at least 30 minutes of exercise that causes your heart to beat faster (aerobic exercise) most days of the week. Activities may include walking, swimming, or biking.  Work with your health care provider or diet and nutrition specialist (dietitian) to adjust your eating plan to your individual calorie needs. Reading food labels  Check food labels for the amount of sodium per  serving. Choose foods with less than 5 percent of the Daily Value of sodium. Generally, foods with less than 300 mg of sodium per serving fit into this eating plan.  To find whole grains, look for the word "whole" as the first word in the ingredient list. Shopping  Buy products labeled as "low-sodium" or "no salt added."  Buy fresh foods. Avoid canned foods and premade or frozen meals. Cooking  Avoid adding salt when cooking. Use salt-free seasonings or herbs instead of table salt or sea salt. Check with your health care provider or pharmacist before using salt substitutes.  Do not fry foods. Cook foods using healthy methods such as baking, boiling, grilling, and broiling instead.  Cook with heart-healthy oils, such as olive, canola, soybean, or sunflower oil. Meal planning   Eat a balanced diet that includes: ? 5 or more servings of fruits and vegetables each day. At each meal, try to fill half of your plate with fruits and vegetables. ? Up to 6-8 servings of whole grains each day. ? Less than 6 oz of  lean meat, poultry, or fish each day. A 3-oz serving of meat is about the same size as a deck of cards. One egg equals 1 oz. ? 2 servings of low-fat dairy each day. ? A serving of nuts, seeds, or beans 5 times each week. ? Heart-healthy fats. Healthy fats called Omega-3 fatty acids are found in foods such as flaxseeds and coldwater fish, like sardines, salmon, and mackerel.  Limit how much you eat of the following: ? Canned or prepackaged foods. ? Food that is high in trans fat, such as fried foods. ? Food that is high in saturated fat, such as fatty meat. ? Sweets, desserts, sugary drinks, and other foods with added sugar. ? Full-fat dairy products.  Do not salt foods before eating.  Try to eat at least 2 vegetarian meals each week.  Eat more home-cooked food and less restaurant, buffet, and fast food.  When eating at a restaurant, ask that your food be prepared with less salt  or no salt, if possible. What foods are recommended? The items listed may not be a complete list. Talk with your dietitian about what dietary choices are best for you. Grains Whole-grain or whole-wheat bread. Whole-grain or whole-wheat pasta. Brown rice. [place]. Bulgur. Whole-grain and low-sodium cereals. Pita bread. Low-fat, low-sodium crackers. Whole-wheat flour tortillas. Vegetables Fresh or frozen vegetables (raw, steamed, roasted, or grilled). Low-sodium or reduced-sodium tomato and vegetable juice. Low-sodium or reduced-sodium tomato sauce and tomato paste. Low-sodium or reduced-sodium canned vegetables. Fruits All fresh, dried, or frozen fruit. Canned fruit in natural juice (without added sugar). Meat and other protein foods Skinless chicken or [place]. Ground chicken or [place]. Pork with fat trimmed off. Fish and seafood. Egg whites. Dried beans, peas, or lentils. Unsalted nuts, nut butters, and seeds. Unsalted canned beans. Lean cuts of beef with fat trimmed off. Low-sodium, lean deli meat. Dairy Low-fat (1%) or fat-free (skim) milk. Fat-free, low-fat, or reduced-fat cheeses. Nonfat, low-sodium ricotta or cottage cheese. Low-fat or nonfat yogurt. Low-fat, low-sodium cheese. Fats and oils Soft margarine without trans fats. Vegetable oil. Low-fat, reduced-fat, or light mayonnaise and salad dressings (reduced-sodium). Canola, safflower, olive, soybean, and sunflower oils. Avocado. Seasoning and other foods Herbs. Spices. Seasoning mixes without salt. Unsalted popcorn and pretzels. Fat-free sweets. What foods are not recommended? The items listed may not be a complete list. Talk with your dietitian about what dietary choices are best for you. Grains Baked goods made with fat, such as croissants, muffins, or some breads. Dry pasta or rice meal packs. Vegetables Creamed or fried vegetables. Vegetables in a cheese sauce. Regular canned vegetables (not low-sodium or reduced-sodium).  Regular canned tomato sauce and paste (not low-sodium or reduced-sodium). Regular tomato and vegetable juice (not low-sodium or reduced-sodium). Angie Fava. Olives. Fruits Canned fruit in a light or heavy syrup. Fried fruit. Fruit in cream or butter sauce. Meat and other protein foods Fatty cuts of meat. Ribs. Fried meat. Berniece Salines. Sausage. Bologna and other processed lunch meats. Salami. Fatback. Hotdogs. Bratwurst. Salted nuts and seeds. Canned beans with added salt. Canned or smoked fish. Whole eggs or egg yolks. Chicken or [place] with skin. Dairy Whole or 2% milk, cream, and half-and-half. Whole or full-fat cream cheese. Whole-fat or sweetened yogurt. Full-fat cheese. Nondairy creamers. Whipped toppings. Processed cheese and cheese spreads. Fats and oils Butter. Stick margarine. Lard. Shortening. Ghee. Bacon fat. Tropical oils, such as coconut, palm kernel, or palm oil. Seasoning and other foods Salted popcorn and pretzels. Onion salt, garlic salt, seasoned salt, table  salt, and sea salt. Worcestershire sauce. Tartar sauce. Barbecue sauce. Teriyaki sauce. Soy sauce, including reduced-sodium. Steak sauce. Canned and packaged gravies. Fish sauce. Oyster sauce. Cocktail sauce. Horseradish that you find on the shelf. Ketchup. Mustard. Meat flavorings and tenderizers. Bouillon cubes. Hot sauce and Tabasco sauce. Premade or packaged marinades. Premade or packaged taco seasonings. Relishes. Regular salad dressings. Where to find more information:  National Heart, Lung, and [place]: https://wilson-eaton.com/  American Heart Association: www.heart.org Summary  The DASH eating plan is a healthy eating plan that has been shown to reduce high blood pressure (hypertension). It may also reduce your risk for type 2 diabetes, heart disease, and stroke.  With the DASH eating plan, you should limit salt (sodium) intake to 2,300 mg a day. If you have hypertension, you may need to reduce your sodium intake to 1,500 mg  a day.  When on the DASH eating plan, aim to eat more fresh fruits and vegetables, whole grains, lean proteins, low-fat dairy, and heart-healthy fats.  Work with your health care provider or diet and nutrition specialist (dietitian) to adjust your eating plan to your individual calorie needs. This information is not intended to replace advice given to you by your health care provider. Make sure you discuss any questions you have with your health care provider. Document Released: 03/17/2011 Document Revised: 03/21/2016 Document Reviewed: 03/21/2016 Elsevier Interactive Patient Education  2017 [employer].

## 2016-11-11 ENCOUNTER — Ambulatory Visit (INDEPENDENT_AMBULATORY_CARE_PROVIDER_SITE_OTHER): Payer: BLUE CROSS/BLUE SHIELD | Admitting: Medical

## 2016-11-11 ENCOUNTER — Telehealth: Payer: Self-pay | Admitting: Medical

## 2016-11-11 ENCOUNTER — Other Ambulatory Visit (INDEPENDENT_AMBULATORY_CARE_PROVIDER_SITE_OTHER): Payer: BLUE CROSS/BLUE SHIELD

## 2016-11-11 VITALS — BP 144/81 | HR 58

## 2016-11-11 DIAGNOSIS — N529 Male erectile dysfunction, unspecified: Secondary | ICD-10-CM | POA: Diagnosis not present

## 2016-11-11 DIAGNOSIS — R6882 Decreased libido: Secondary | ICD-10-CM

## 2016-11-11 DIAGNOSIS — R030 Elevated blood-pressure reading, without diagnosis of hypertension: Secondary | ICD-10-CM

## 2016-11-11 DIAGNOSIS — Z0000 Encounter for general adult medical examination without abnormal findings: Secondary | ICD-10-CM

## 2016-11-11 DIAGNOSIS — R5383 Other fatigue: Secondary | ICD-10-CM

## 2016-11-11 DIAGNOSIS — Z1159 Encounter for screening for other viral diseases: Secondary | ICD-10-CM

## 2016-11-11 DIAGNOSIS — Z125 Encounter for screening for malignant neoplasm of prostate: Secondary | ICD-10-CM

## 2016-11-11 LAB — CBC WITH DIFFERENTIAL/PLATELET
BASOS PCT: 0.6 % (ref 0.0–3.0)
Basophils Absolute: 0 10*3/uL (ref 0.0–0.1)
EOS ABS: 0.1 10*3/uL (ref 0.0–0.7)
EOS PCT: 1.4 % (ref 0.0–5.0)
HCT: 43.5 % (ref 39.0–52.0)
Hemoglobin: 14.6 g/dL (ref 13.0–17.0)
LYMPHS ABS: 2.7 10*3/uL (ref 0.7–4.0)
Lymphocytes Relative: 34.8 % (ref 12.0–46.0)
MCHC: 33.5 g/dL (ref 30.0–36.0)
MCV: 88.8 fl (ref 78.0–100.0)
MONO ABS: 0.6 10*3/uL (ref 0.1–1.0)
Monocytes Relative: 7.8 % (ref 3.0–12.0)
NEUTROS ABS: 4.3 10*3/uL (ref 1.4–7.7)
NEUTROS PCT: 55.4 % (ref 43.0–77.0)
PLATELETS: 221 10*3/uL (ref 150.0–400.0)
RBC: 4.91 Mil/uL (ref 4.22–5.81)
RDW: 14.1 % (ref 11.5–15.5)
WBC: 7.8 10*3/uL (ref 4.0–10.5)

## 2016-11-11 LAB — POC URINALSYSI DIPSTICK (AUTOMATED)
BILIRUBIN UA: NEGATIVE
Blood, UA: NEGATIVE
Glucose, UA: NEGATIVE
Ketones, UA: NEGATIVE
LEUKOCYTES UA: NEGATIVE
NITRITE UA: NEGATIVE
PH UA: 6 (ref 5.0–8.0)
PROTEIN UA: NEGATIVE
Spec Grav, UA: 1.015 (ref 1.010–1.025)
Urobilinogen, UA: 0.2 E.U./dL

## 2016-11-11 LAB — COMPREHENSIVE METABOLIC PANEL
ALK PHOS: 52 U/L (ref 39–117)
ALT: 29 U/L (ref 0–53)
AST: 21 U/L (ref 0–37)
Albumin: 3.9 g/dL (ref 3.5–5.2)
BUN: 12 mg/dL (ref 6–23)
CHLORIDE: 105 meq/L (ref 96–112)
CO2: 28 meq/L (ref 19–32)
Calcium: 9 mg/dL (ref 8.4–10.5)
Creatinine, Ser: 0.82 mg/dL (ref 0.40–1.50)
GFR: 101.49 mL/min (ref >60.00)
Glucose, Bld: 125 mg/dL — ABNORMAL HIGH (ref 70–99)
POTASSIUM: 4.2 meq/L (ref 3.5–5.1)
SODIUM: 138 meq/L (ref 135–145)
Total Bilirubin: 0.8 mg/dL (ref 0.2–1.2)
Total Protein: 6.9 g/dL (ref 6.0–8.3)

## 2016-11-11 LAB — LIPID PANEL
Cholesterol: 161 mg/dL (ref 0–200)
HDL: 26.8 mg/dL — ABNORMAL LOW (ref >39.00)
LDL Cholesterol: 98 mg/dL (ref 0–99)
NONHDL: 134.23
Total CHOL/HDL Ratio: 6
Triglycerides: 182 mg/dL — ABNORMAL HIGH (ref 0.0–149.0)
VLDL: 36.4 mg/dL (ref 0.0–40.0)

## 2016-11-11 LAB — TSH: TSH: 2.65 u[IU]/mL (ref 0.35–4.50)

## 2016-11-11 LAB — TESTOSTERONE: TESTOSTERONE: 113.21 ng/dL — AB (ref 300.00–890.00)

## 2016-11-11 NOTE — Patient Instructions (Addendum)
Per [name], PA-C: Adhere to the DASH diet as discussed in the last office visit. Obtain a home BP cuff & check readings daily. Follow-up in 10 days with PCP.

## 2016-11-11 NOTE — Progress Notes (Signed)
Pre visit review using our clinic review tool, if applicable. No additional management support is needed unless otherwise documented below in the visit note.  Patient came in office for blood pressure check per OV note 11/10/16. Currently the patient does not take any medication for blood pressure. He denies chest pain, headaches, dizziness, lightheadedness & numbness/tingling in the limbs. Readings during today's visit were as follow: BP 157/90 P 58 & BP 144/81 P 58.  Per [name], PA-C: Adhere to the DASH diet as discussed in the last office visit. Obtain a home BP cuff & check readings daily. Follow-up in 10 days with PCP.  Informed patient of the provider's recommendations. He voiced understanding. Next appointment scheduled for 11/21/16 at 9:15 AM.  This was advise I gave. Just saw him yesterday so he did not have opportunity to follow dash diet or cut back on caffeine. So will see him back in 10 days as stated above.  [place], Percell Miller, PA-C

## 2016-11-11 NOTE — Telephone Encounter (Signed)
Referral faxed to [facility], awaiting appt

## 2016-11-11 NOTE — Telephone Encounter (Signed)
Future psa placed.

## 2016-11-12 LAB — HEPATITIS C ANTIBODY: HCV Ab: REACTIVE — AB

## 2016-11-14 LAB — HEPATITIS C RNA QUANTITATIVE
HCV QUANT LOG: NOT DETECTED {Log_IU}/mL
HCV QUANT: NOT DETECTED [IU]/mL

## 2016-11-14 LAB — SEX HORMONE BINDING GLOBULIN: SEX HORMONE BINDING: 20 nmol/L — AB (ref 22–77)

## 2016-11-14 LAB — TESTOSTERONE, % FREE: Testosterone-% Free: 2.4 % (ref 1.6–2.9)

## 2016-11-14 LAB — TESTOSTERONE, FREE: Testosterone, Free: 32.2 pg/mL — ABNORMAL LOW (ref 47.0–244.0)

## 2016-11-21 ENCOUNTER — Ambulatory Visit (INDEPENDENT_AMBULATORY_CARE_PROVIDER_SITE_OTHER): Payer: BLUE CROSS/BLUE SHIELD | Admitting: Medical

## 2016-11-21 ENCOUNTER — Encounter: Payer: Self-pay | Admitting: Medical

## 2016-11-21 VITALS — BP 145/85 | HR 62 | Temp 98.2°F | Resp 14 | Ht 66.0 in | Wt 208.4 lb

## 2016-11-21 DIAGNOSIS — Z125 Encounter for screening for malignant neoplasm of prostate: Secondary | ICD-10-CM

## 2016-11-21 DIAGNOSIS — N529 Male erectile dysfunction, unspecified: Secondary | ICD-10-CM

## 2016-11-21 DIAGNOSIS — R7989 Other specified abnormal findings of blood chemistry: Secondary | ICD-10-CM

## 2016-11-21 DIAGNOSIS — I10 Essential (primary) hypertension: Secondary | ICD-10-CM

## 2016-11-21 MED ORDER — LOSARTAN POTASSIUM 100 MG PO TABS: 100.0000 mg | ORAL_TABLET | Freq: Every day | ORAL | 3 refills | Status: DC

## 2016-11-21 NOTE — Patient Instructions (Signed)
For your high blood pressure will rx losartan medication.   For ED can try viagra. When you use let me know if works.   Low testosterone. Will get psa today as assessment for possible use testosterone in future. Explained benefit vs risk.  Follow up 1 months or as needed

## 2016-11-21 NOTE — Addendum Note (Signed)
Addended by: Caffie Pinto on: 11/21/2016 01:50 PM   Modules accepted: Orders

## 2016-11-21 NOTE — Progress Notes (Signed)
Subjective:    Patient ID: Steve [name], male    DOB: [birth year], 61 y.o.   MRN: [id number]  HPI  Pt bp are still high. bp 160/95 today at home. Later he checked 160/80. When I checked 150/86. MA check was 145/82. No cardiac or neurologic signs or symptoms.  Pt did follow dash diet but still the above readings.  Pt states he has not gotten viagra filled yet. He states using this not priority now.  Pt in past had low testosterone recently. In past had used androgel. Over 10 years ago since last used. He is not sure he wants to use again.   Review of Systems  Constitutional: Negative for chills and fatigue.  Respiratory: Negative for cough, chest tightness, shortness of breath and wheezing.   Cardiovascular: Negative for chest pain and palpitations.  Genitourinary: Negative for flank pain and frequency.       ED.  Musculoskeletal: Negative for arthralgias, back pain, gait problem and neck pain.  Skin: Negative for rash.  Neurological: Negative for dizziness and light-headedness.  Hematological: Negative for adenopathy. Does not bruise/bleed easily.  Psychiatric/Behavioral: Negative for behavioral problems, confusion and suicidal ideas. The patient is not nervous/anxious.     Past Medical History:  Diagnosis Date  . Abscess of muscle 08/10/2011   staph infection of right hip   . GERD (gastroesophageal reflux disease)    rare reflux - no meds for reflux - NO PROBLEM IN PAST SEVERAL YRS  . Hip dysplasia, congenital    no surgery as a child for hip dysplasia - has had bilateral hip replacements as an adult  . Pancreatitis   . Postoperative anemia due to acute blood loss 09/07/2012  . Septic arthritis of hip ([place]) 09/05/2012   PT'S TOTAL HIP JOINT REMOVED - ANTIBIOTIC SPACE PLACED AND PT HAS FINISHED IV ANTIBIOTICS ( PICC LINE REMOVED)  . Sleep apnea    USES CPAP     Social History   Social History  . Marital status: Married    Spouse name: N/A  . Number of children: N/A    . Years of education: N/A   Occupational History  . Not on file.   Social History Main Topics  . Smoking status: Never Smoker  . Smokeless tobacco: Never Used  . Alcohol use 0.0 oz/week     Comment: rare beer. 5 times a year at most. 2 beers when he drinks.  . Drug use: No  . Sexual activity: Yes    Partners: Female   Other Topics Concern  . 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 COLONOSCOPY  04/26/2007  . HERNIA REPAIR     inguinal hernia x3  . JOINT REPLACEMENT  2002 & 2007   bilateral hip replacement  . MULTIPLE EXTRACTIONS WITH ALVEOLOPLASTY  07/28/2011   Procedure: MULTIPLE EXTRACION WITH ALVEOLOPLASTY;  Surgeon: Lenn Cal, DDS;  Location: [facility];  Service: Oral Surgery;  Laterality: N/A;  Extraction of tooth #'s 2,3,4,5,6,11,12,13,15,19,22 with alveoloplasty.  . shoulder repair - right for separation of shoulder    . TEE WITHOUT CARDIOVERSION  07/29/2011   Procedure: TRANSESOPHAGEAL ECHOCARDIOGRAM (TEE);  Surgeon: Josue Hector, MD;  Location: [facility];  Service: Cardiovascular;  Laterality: N/A;  . TOTAL HIP REVISION Right 09/05/2012   Procedure: RIGHT HIP RESECTION ARTHROPLASTY WITH ANTIBIOTIC  SPACERS;  Surgeon: [name], MD;  Location: [facility];  Service: Orthopedics;  Laterality: Right;  . TOTAL HIP REVISION Right 11/30/2012   Procedure: RIGHT TOTAL HIP ARTHROPLASTY REIMPLANTATION;  Surgeon: [name], MD;  Location: [facility];  Service: Orthopedics;  Laterality: Right;    Family History  Problem Relation Age of Onset  . Melanoma Mother   . Heart attack Mother   . Hyperlipidemia Neg Hx   . Sudden death Neg Hx   . Hypertension Neg Hx   . Diabetes Neg Hx     No Known Allergies  Current Outpatient Prescriptions on File Prior to Visit  Medication Sig Dispense Refill  . sildenafil (VIAGRA) 100 MG tablet  Take 0.5-1 tablets (50-100 mg total) by mouth daily as needed for erectile dysfunction. 10 tablet 0   No current facility-administered medications on file prior to visit.     BP (!) 145/85   Pulse 62   Temp 98.2 F (36.8 C) (Oral)   Resp 14   Ht 5\' 6"  (1.676 m)   Wt 208 lb 6.4 oz (94.5 kg)   SpO2 95%   BMI 33.64 kg/m       Objective:   Physical Exam  General Mental Status- Alert. General Appearance- Not in acute distress.   Skin General: Color- Normal Color. Moisture- Normal Moisture.  Neck Carotid Arteries- Normal color. Moisture- Normal Moisture. No carotid bruits. No JVD.  Chest and Lung Exam Auscultation: Breath Sounds:-Normal.  Cardiovascular Auscultation:Rythm- Regular. Murmurs & Other Heart Sounds:Auscultation of the heart reveals- No Murmurs.  Abdomen Inspection:-Inspeection Normal. Palpation/Percussion:Note:No mass. Palpation and Percussion of the abdomen reveal- Non Tender, Non Distended + BS, no rebound or guarding.    Neurologic Cranial Nerve exam:- CN III-XII intact(No nystagmus), symmetric smile. Strength:- 5/5 equal and symmetric strength both upper and lower extremities.      Assessment & Plan:  For your high blood pressure will rx losartan medication.   For ED can try viagra. When you use let me know if works.   Low testosterone. Will get psa today as assessment for possible use testosterone in future. Explained benefit vs risk.  Follow up 1 months or as needed

## 2016-11-22 LAB — PSA: PSA: 0.85 ng/mL (ref 0.10–4.00)

## 2016-12-26 ENCOUNTER — Encounter: Payer: Self-pay | Admitting: Medical

## 2016-12-26 ENCOUNTER — Ambulatory Visit (INDEPENDENT_AMBULATORY_CARE_PROVIDER_SITE_OTHER): Payer: BLUE CROSS/BLUE SHIELD | Admitting: Medical

## 2016-12-26 VITALS — BP 140/88 | HR 52 | Temp 97.8°F | Resp 14 | Ht 66.0 in | Wt 208.0 lb

## 2016-12-26 DIAGNOSIS — I10 Essential (primary) hypertension: Secondary | ICD-10-CM | POA: Diagnosis not present

## 2016-12-26 MED ORDER — TESTOSTERONE 4 MG/24HR TD PT24: 1.0000 | MEDICATED_PATCH | Freq: Every day | TRANSDERMAL | 2 refills | Status: DC

## 2016-12-26 NOTE — Patient Instructions (Addendum)
Your bp is borderline today. Your at home bp reading with your machine are better.  I want you to continue to check your bp. If you bp on your machine over 140/90 then would need to add low dose diuretic to losartan.   For low T rx testosterone patch. Rx advisement given on benefits vs risk testosterone.  Follow up 1 month or as needed

## 2016-12-26 NOTE — Progress Notes (Signed)
Subjective:    Patient ID: Steve [name], male    DOB: 1955/11/23, 61 y.o.   MRN: [id number]  HPI  Pt in for follow up. He started on losartan. His bp was 132/78. Other bp checks at home 135/82 and 135/78. This morning he states got in argument with wife and thus his bp was a little higher. No cardiac signs or symptoms reported.  Most of bp 130/80 range.  Pt has cut out caffeine and using low salt diet.  Pt has not filled his viagra rx yet. His wife has been sick.  Pt has known low testosterone. He has been on supplemation in the past. Recent blood work confirmed. Patient is aware of risk and benefits of testosterone supplementation. Discussed that on last visit and this visit.  Review of Systems  Constitutional: Negative for chills, fatigue and fever.  Respiratory: Negative for cough, chest tightness, shortness of breath and wheezing.   Cardiovascular: Negative for chest pain and palpitations.  Gastrointestinal: Negative for abdominal pain.  Genitourinary: Negative for decreased urine volume, difficulty urinating, dysuria, flank pain, penile pain and penile swelling.       Erectile dysfunction  Musculoskeletal: Negative for back pain and neck pain.  Skin: Negative for rash.  Neurological: Negative for dizziness, speech difficulty, weakness, numbness and headaches.  Hematological: Negative for adenopathy. Does not bruise/bleed easily.  Psychiatric/Behavioral: Negative for behavioral problems, confusion, hallucinations, self-injury and suicidal ideas. The patient is not nervous/anxious.     Past Medical History:  Diagnosis Date  . Abscess of muscle 08/10/2011   staph infection of right hip   . GERD (gastroesophageal reflux disease)    rare reflux - no meds for reflux - NO PROBLEM IN PAST SEVERAL YRS  . Hip dysplasia, congenital    no surgery as a child for hip dysplasia - has had bilateral hip replacements as an adult  . Pancreatitis   . Postoperative anemia due to acute  blood loss 09/07/2012  . Septic arthritis of hip ([place]) 09/05/2012   PT'S TOTAL HIP JOINT REMOVED - ANTIBIOTIC SPACE PLACED AND PT HAS FINISHED IV ANTIBIOTICS ( PICC LINE REMOVED)  . Sleep apnea    USES CPAP     Social History   Social History  . Marital status: Married    Spouse name: N/A  . Number of children: N/A  . Years of education: N/A   Occupational History  . Not on file.   Social History Main Topics  . Smoking status: Never Smoker  . Smokeless tobacco: Never Used  . Alcohol use 0.0 oz/week     Comment: rare beer. 5 times a year at most. 2 beers when he drinks.  . Drug use: No  . Sexual activity: Yes    Partners: Female   Other Topics Concern  . 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 COLONOSCOPY  04/26/2007  . HERNIA REPAIR     inguinal hernia x3  . JOINT REPLACEMENT  2002 & 2007   bilateral hip replacement  . MULTIPLE EXTRACTIONS WITH ALVEOLOPLASTY  07/28/2011   Procedure: MULTIPLE EXTRACION WITH ALVEOLOPLASTY;  Surgeon: Lenn Cal, DDS;  Location: [facility];  Service: Oral Surgery;  Laterality: N/A;  Extraction of tooth #'s 2,3,4,5,6,11,12,13,15,19,22 with alveoloplasty.  . shoulder repair - right for separation of shoulder    .  TEE WITHOUT CARDIOVERSION  07/29/2011   Procedure: TRANSESOPHAGEAL ECHOCARDIOGRAM (TEE);  Surgeon: Josue Hector, MD;  Location: [facility];  Service: Cardiovascular;  Laterality: N/A;  . TOTAL HIP REVISION Right 09/05/2012   Procedure: RIGHT HIP RESECTION ARTHROPLASTY WITH ANTIBIOTIC SPACERS;  Surgeon: [name], MD;  Location: [facility];  Service: Orthopedics;  Laterality: Right;  . TOTAL HIP REVISION Right 11/30/2012   Procedure: RIGHT TOTAL HIP ARTHROPLASTY REIMPLANTATION;  Surgeon: [name], MD;  [facility]: [facility];  Service: Orthopedics;  Laterality: Right;    Family History    Problem Relation Age of Onset  . Melanoma Mother   . Heart attack Mother   . Hyperlipidemia Neg Hx   . Sudden death Neg Hx   . Hypertension Neg Hx   . Diabetes Neg Hx     No Known Allergies  Current Outpatient Prescriptions on File Prior to Visit  Medication Sig Dispense Refill  . losartan (COZAAR) 100 MG tablet Take 1 tablet (100 mg total) by mouth daily. 30 tablet 3  . sildenafil (VIAGRA) 100 MG tablet Take 0.5-1 tablets (50-100 mg total) by mouth daily as needed for erectile dysfunction. 10 tablet 0   No current facility-administered medications on file prior to visit.     BP (!) 150/86   Pulse (!) 52   Temp 97.8 F (36.6 C) (Oral)   Resp 14   Ht 5\' 6"  (1.676 m)   Wt 208 lb (94.3 kg)   SpO2 97%   BMI 33.57 kg/m       Objective:   Physical Exam  General Mental Status- Alert. General Appearance- Not in acute distress.   Skin General: Color- Normal Color. Moisture- Normal Moisture.  Neck Carotid Arteries- Normal color. Moisture- Normal Moisture. No carotid bruits. No JVD.  Chest and Lung Exam Auscultation: Breath Sounds:-Normal.  Cardiovascular Auscultation:Rythm- Regular. Murmurs & Other Heart Sounds:Auscultation of the heart reveals- No Murmurs.  Abdomen Inspection:-Inspeection Normal. Palpation/Percussion:Note:No mass. Palpation and Percussion of the abdomen reveal- Non Tender, Non Distended + BS, no rebound or guarding.  Neurologic Cranial Nerve exam:- CN III-XII intact(No nystagmus), symmetric smile. Strength:- 5/5 equal and symmetric strength both upper and lower extremities.     Assessment & Plan:  Your bp is borderline today. Your at home bp reading with your machine are better.  I want you to continue to check your bp. If you bp on your machine over 140/90 then would need to add low dose diuretic to losartan.   For low T rx testosterone patch. Rx advisement given on benefits vs risk testosterone.  Follow up 1 month or as  needed  Nahia [employer], Percell Miller, [employer]

## 2017-01-07 ENCOUNTER — Encounter: Payer: Self-pay | Admitting: Medical

## 2017-01-09 MED ORDER — LOSARTAN POTASSIUM 100 MG PO TABS: 100.0000 mg | ORAL_TABLET | Freq: Every day | ORAL | 0 refills | Status: DC

## 2017-01-10 ENCOUNTER — Other Ambulatory Visit: Payer: Self-pay

## 2017-01-10 MED ORDER — LOSARTAN POTASSIUM 100 MG PO TABS: 100.0000 mg | ORAL_TABLET | Freq: Every day | ORAL | 0 refills | Status: DC

## 2017-01-10 NOTE — Telephone Encounter (Signed)
Ins req 90d for Losartan/faxed #90/thx dmf

## 2017-01-23 ENCOUNTER — Ambulatory Visit (INDEPENDENT_AMBULATORY_CARE_PROVIDER_SITE_OTHER): Payer: BLUE CROSS/BLUE SHIELD | Admitting: Medical

## 2017-01-23 ENCOUNTER — Encounter: Payer: Self-pay | Admitting: Medical

## 2017-01-23 VITALS — BP 123/62 | HR 58 | Temp 97.4°F | Resp 16 | Ht 66.0 in | Wt 209.6 lb

## 2017-01-23 DIAGNOSIS — R7989 Other specified abnormal findings of blood chemistry: Secondary | ICD-10-CM | POA: Diagnosis not present

## 2017-01-23 DIAGNOSIS — I10 Essential (primary) hypertension: Secondary | ICD-10-CM | POA: Diagnosis not present

## 2017-01-23 DIAGNOSIS — Z23 Encounter for immunization: Secondary | ICD-10-CM | POA: Diagnosis not present

## 2017-01-23 LAB — LIPID PANEL
CHOLESTEROL: 139 mg/dL (ref 0–200)
HDL: 23.3 mg/dL — AB (ref >39.00)
LDL Cholesterol: 80 mg/dL (ref 0–99)
NONHDL: 115.54
Total CHOL/HDL Ratio: 6
Triglycerides: 176 mg/dL — ABNORMAL HIGH (ref 0.0–149.0)
VLDL: 35.2 mg/dL (ref 0.0–40.0)

## 2017-01-23 NOTE — Addendum Note (Signed)
Addended by: Hinton Dyer on: 01/23/2017 08:35 AM   Modules accepted: Orders

## 2017-01-23 NOTE — Patient Instructions (Addendum)
Your blood pressure is well controlled today and your at home readings are well controlled. Continue current dose of losartan.  For low testosterone continue testosterone patch.  Check fasting lipid panel today.  Will get flu vaccine today.  Follow up 3 months as needed

## 2017-01-23 NOTE — Progress Notes (Signed)
Subjective:    Patient ID: Steve [name], male    DOB: 08/22/1955, 61 y.o.   MRN: [id number]  HPI Pt is in for follow up on his blood pressure. Pt bp is about same at home on his machine as our reading today. No cardiaco neurologic signs or symptoms.  Pt states his energy is better with use of testosterone.  Pt wants flu vaccine today.  Pt is fasting today. Will get lipid panel.    Review of Systems  Constitutional: Negative for chills, fatigue and fever.  Respiratory: Negative for cough, chest tightness, shortness of breath and wheezing.   Cardiovascular: Negative for chest pain and palpitations.  Gastrointestinal: Negative for abdominal pain and blood in stool.  Genitourinary: Negative for dysuria and flank pain.  Musculoskeletal: Negative for back pain and joint swelling.  Neurological: Negative for dizziness, speech difficulty, weakness, numbness and headaches.  Hematological: Negative for adenopathy. Does not bruise/bleed easily.  Psychiatric/Behavioral: Negative for behavioral problems, decreased concentration, dysphoric mood and sleep disturbance.    Past Medical History:  Diagnosis Date  . Abscess of muscle 08/10/2011   staph infection of right hip   . GERD (gastroesophageal reflux disease)    rare reflux - no meds for reflux - NO PROBLEM IN PAST SEVERAL YRS  . Hip dysplasia, congenital    no surgery as a child for hip dysplasia - has had bilateral hip replacements as an adult  . Pancreatitis   . Postoperative anemia due to acute blood loss 09/07/2012  . Septic arthritis of hip ([place]) 09/05/2012   PT'S TOTAL HIP JOINT REMOVED - ANTIBIOTIC SPACE PLACED AND PT HAS FINISHED IV ANTIBIOTICS ( PICC LINE REMOVED)  . Sleep apnea    USES CPAP     Social History   Social History  . Marital status: Married    Spouse name: N/A  . Number of children: N/A  . Years of education: N/A   Occupational History  . Not on file.   Social History Main Topics  . Smoking status:  Never Smoker  . Smokeless tobacco: Never Used  . Alcohol use 0.0 oz/week     Comment: rare beer. 5 times a year at most. 2 beers when he drinks.  . Drug use: No  . Sexual activity: Yes    Partners: Female   Other Topics Concern  . 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 COLONOSCOPY  04/26/2007  . HERNIA REPAIR     inguinal hernia x3  . JOINT REPLACEMENT  2002 & 2007   bilateral hip replacement  . MULTIPLE EXTRACTIONS WITH ALVEOLOPLASTY  07/28/2011   Procedure: MULTIPLE EXTRACION WITH ALVEOLOPLASTY;  Surgeon: Lenn Cal, DDS;  Location: [facility];  Service: Oral Surgery;  Laterality: N/A;  Extraction of tooth #'s 2,3,4,5,6,11,12,13,15,19,22 with alveoloplasty.  . shoulder repair - right for separation of shoulder    . TEE WITHOUT CARDIOVERSION  07/29/2011   Procedure: TRANSESOPHAGEAL ECHOCARDIOGRAM (TEE);  Surgeon: Josue Hector, MD;  Location: [place] [facility];  Service: Cardiovascular;  Laterality: N/A;  . TOTAL HIP REVISION Right 09/05/2012   Procedure: RIGHT HIP RESECTION ARTHROPLASTY WITH ANTIBIOTIC SPACERS;  Surgeon: [name], MD;  Location: [facility];  Service: Orthopedics;  Laterality: Right;  . TOTAL HIP REVISION Right 11/30/2012   Procedure: RIGHT TOTAL HIP ARTHROPLASTY REIMPLANTATION;  Surgeon:  [name], MD;  [facility];  Service: Orthopedics;  Laterality: Right;    Family History  Problem Relation Age of Onset  . Melanoma Mother   . Heart attack Mother   . Hyperlipidemia Neg Hx   . Sudden death Neg Hx   . Hypertension Neg Hx   . Diabetes Neg Hx     No Known Allergies  Current Outpatient Prescriptions on File Prior to Visit  Medication Sig Dispense Refill  . losartan (COZAAR) 100 MG tablet Take 1 tablet (100 mg total) by mouth daily. 90 tablet 0  . sildenafil (VIAGRA) 100 MG tablet Take 0.5-1 tablets (50-100 mg  total) by mouth daily as needed for erectile dysfunction. 10 tablet 0  . testosterone (ANDRODERM) 4 MG/24HR PT24 patch Place 1 patch onto the skin daily. 30 patch 2   No current facility-administered medications on file prior to visit.     BP 123/62   Pulse (!) 58   Temp (!) 97.4 F (36.3 C) (Oral)   Resp 16   Ht 5\' 6"  (1.676 m)   Wt 209 lb 9.6 oz (95.1 kg)   SpO2 98%   PF 58 L/min   BMI 33.83 kg/m       Objective:   Physical Exam  General Mental Status- Alert. General Appearance- Not in acute distress.   Skin General: Color- Normal Color. Moisture- Normal Moisture.  Neck Carotid Arteries- Normal color. Moisture- Normal Moisture. No carotid bruits. No JVD.  Chest and Lung Exam Auscultation: Breath Sounds:-Normal.  Cardiovascular Auscultation:Rythm- Regular. Murmurs & Other Heart Sounds:Auscultation of the heart reveals- No Murmurs.   Neurologic Cranial Nerve exam:- CN III-XII intact(No nystagmus), symmetric smile. Strength:- 5/5 equal and symmetric strength both upper and lower extremities.      Assessment & Plan:  Your blood pressure is well controlled today and your at home readings are well controlled. Continue current dose of losartan.  For low testosterone continue testosterone patch.  Check fasting lipid panel today.  Will get flu vaccine today.  Follow up 3 months as needed

## 2017-01-31 ENCOUNTER — Telehealth: Payer: Self-pay | Admitting: *Deleted

## 2017-01-31 NOTE — Telephone Encounter (Signed)
Received results from [facility]; forwarded to provider/SLS 10/23

## 2017-02-21 ENCOUNTER — Telehealth: Payer: Self-pay | Admitting: Medical

## 2017-02-21 NOTE — Telephone Encounter (Signed)
I reviewed the dermatologist biopsy note that they sent me.  The report stated melanoma in situ.  I do not actually see the dermatologist note.  Would you call patient and ask him when they told him to follow-up.  They plan to see him on an annual basis to check his skin.  Let me know what he says.

## 2017-02-24 NOTE — Telephone Encounter (Signed)
Tried to reach pt no answer.

## 2017-03-29 ENCOUNTER — Telehealth: Payer: Self-pay | Admitting: Medical

## 2017-03-29 NOTE — Telephone Encounter (Signed)
Pt. Has voice mail that is not set up. Unable to leave a message.

## 2017-03-29 NOTE — Telephone Encounter (Signed)
Copied from [facility]. Topic: Quick Communication - See Telephone Encounter >> Mar 29, 2017  1:03 PM Synthia Innocent wrote: CRM for notification. See Telephone encounter for: Patient was seen in Oct for skin bx with [facility], bx came back positive for melanoma, they have been unable to reach patient. Pathology was faxed to our office.   03/29/17.

## 2017-04-25 ENCOUNTER — Ambulatory Visit: Payer: BLUE CROSS/BLUE SHIELD | Admitting: Medical

## 2017-04-25 ENCOUNTER — Encounter: Payer: Self-pay | Admitting: Medical

## 2017-04-25 VITALS — BP 120/68 | HR 78 | Temp 98.0°F | Resp 16 | Ht 66.0 in | Wt 215.2 lb

## 2017-04-25 DIAGNOSIS — N529 Male erectile dysfunction, unspecified: Secondary | ICD-10-CM | POA: Diagnosis not present

## 2017-04-25 DIAGNOSIS — I10 Essential (primary) hypertension: Secondary | ICD-10-CM

## 2017-04-25 DIAGNOSIS — R7989 Other specified abnormal findings of blood chemistry: Secondary | ICD-10-CM | POA: Diagnosis not present

## 2017-04-25 NOTE — Patient Instructions (Addendum)
Bp well controlled. Continue current med no changes.  Pt told with his skin cancer type no further treatment needed per dermatologist report. He will continue to follow up with dermatologist for routine screening.  For testosterone  Level low will need to consider other way to deliver or refer you to endocrinologist(dc patch due to allergic reaction). Discussed increase fruits, vegetables, lean meats and try to loose some weight. Less fat on body could help increase testosterone.  Can try Viagra if needed for ED.  Follow up 3 months or as needed

## 2017-04-25 NOTE — Progress Notes (Signed)
Subjective:    Patient ID: Steve [name], male    DOB: 09-13-1955, 62 y.o.   MRN: [id number]  HPI  Pt in today for follow up.  Pt is not fasting today. Pt blood pressure controlled today. He checks occasionally. At dermatologist his bp was well controlled. Today his bp is well controlled. No neurologic or cardiac signs or symptoms.  Pt did have surgery for skin lesion that turned out to be cancer. No further tx needed per pt.  Pt states testosterone patches caused some whelps on his skin. Pt total testosterone was low.  Pt never got viagra filled. He states he has been so busy with work he decided not to fill.    Review of Systems  Constitutional: Negative for chills, fatigue and fever.  Respiratory: Negative for cough, choking, chest tightness, shortness of breath and wheezing.   Cardiovascular: Negative for palpitations.  Gastrointestinal: Negative for abdominal pain.  Genitourinary: Negative for difficulty urinating, dysuria, enuresis, frequency, genital sores, hematuria, penile swelling, scrotal swelling and urgency.       Hx of erectile dysfunction.  Describes normal libido now.  Musculoskeletal: Negative for back pain.  Neurological: Negative for dizziness, syncope, weakness and light-headedness.  Hematological: Negative for adenopathy. Does not bruise/bleed easily.  Psychiatric/Behavioral: Negative for behavioral problems and confusion.    Past Medical History:  Diagnosis Date  . Abscess of muscle 08/10/2011   staph infection of right hip   . GERD (gastroesophageal reflux disease)    rare reflux - no meds for reflux - NO PROBLEM IN PAST SEVERAL YRS  . Hip dysplasia, congenital    no surgery as a child for hip dysplasia - has had bilateral hip replacements as an adult  . Pancreatitis   . Postoperative anemia due to acute blood loss 09/07/2012  . Septic arthritis of hip ([place]) 09/05/2012   PT'S TOTAL HIP JOINT REMOVED - ANTIBIOTIC SPACE PLACED AND PT HAS FINISHED IV  ANTIBIOTICS ( PICC LINE REMOVED)  . Sleep apnea    USES CPAP     Social History   Socioeconomic History  . Marital status: Married    Spouse name: Not on file  . Number of children: Not on file  . Years of education: Not on file  . Highest education level: Not on file  Social Needs  . Financial resource strain: Not on file  . Food insecurity - worry: Not on file  . Food insecurity - inability: Not on file  . Transportation needs - medical: Not on file  . Transportation needs - non-medical: Not on file  Occupational History  . Not on file  Tobacco Use  . Smoking status: Never Smoker  . Smokeless tobacco: Never Used  Substance and Sexual Activity  . Alcohol use: Yes    Alcohol/week: 0.0 oz    Comment: rare beer. 5 times a year at most. 2 beers when he drinks.  . Drug use: No  . Sexual activity: Yes    Partners: Female  Other Topics Concern  . 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 COLONOSCOPY  04/26/2007  . HERNIA REPAIR     inguinal hernia x3  . JOINT REPLACEMENT  2002 & 2007   bilateral hip replacement  . MULTIPLE EXTRACTIONS WITH ALVEOLOPLASTY  07/28/2011   Procedure: MULTIPLE EXTRACION WITH ALVEOLOPLASTY;  Surgeon:  Lenn Cal, DDS;  Location: [facility];  Service: Oral Surgery;  Laterality: N/A;  Extraction of tooth #'s 2,3,4,5,6,11,12,13,15,19,22 with alveoloplasty.  . shoulder repair - right for separation of shoulder    . TEE WITHOUT CARDIOVERSION  07/29/2011   Procedure: TRANSESOPHAGEAL ECHOCARDIOGRAM (TEE);  Surgeon: Josue Hector, MD;  Location: [place];  Service: Cardiovascular;  Laterality: N/A;  . TOTAL HIP REVISION Right 09/05/2012   Procedure: RIGHT HIP RESECTION ARTHROPLASTY WITH ANTIBIOTIC SPACERS;  Surgeon: [name], MD;  [facility]: [facility];  Service: Orthopedics;  Laterality: Right;  . TOTAL HIP REVISION  Right 11/30/2012   Procedure: RIGHT TOTAL HIP ARTHROPLASTY REIMPLANTATION;  Surgeon: [name], MD;  [facility]: [facility];  Service: Orthopedics;  Laterality: Right;    Family History  Problem Relation Age of Onset  . Melanoma Mother   . Heart attack Mother   . Hyperlipidemia Neg Hx   . Sudden death Neg Hx   . Hypertension Neg Hx   . Diabetes Neg Hx     No Known Allergies  Current Outpatient Medications on File Prior to Visit  Medication Sig Dispense Refill  . losartan (COZAAR) 100 MG tablet Take 1 tablet (100 mg total) by mouth daily. 90 tablet 0  . sildenafil (VIAGRA) 100 MG tablet Take 0.5-1 tablets (50-100 mg total) by mouth daily as needed for erectile dysfunction. 10 tablet 0  . testosterone (ANDRODERM) 4 MG/24HR PT24 patch Place 1 patch onto the skin daily. 30 patch 2   No current facility-administered medications on file prior to visit.     BP 120/68   Pulse 78   Temp 98 F (36.7 C) (Oral)   Resp 16   Ht 5\' 6"  (1.676 m)   Wt 215 lb 3.2 oz (97.6 kg)   SpO2 97%   BMI 34.73 kg/m       Objective:   Physical Exam  General- No acute distress. Pleasant patient. Neck- Full range of motion, no jvd Lungs- Clear, even and unlabored. Heart- regular rate and rhythm. Neurologic- CNII- XII grossly intact.  Skin- large scar left flank area where he had skin cancer removed.      Assessment & Plan:  Bp well controlled. Continue current med no changes.  Pt told with his skin cancer type no further treatment needed per dermatologist report. He will continue to follow up with dermatologist for routine screening.  For testosterone  Level low will need to consider other way to deliver or refer you to endocrinologist.  Can try Viagra if needed for ED.  Follow up in 3 months or as needed  Juniper Cobey, Percell Miller, [employer]

## 2017-05-11 ENCOUNTER — Encounter: Payer: Self-pay | Admitting: Internal Medicine

## 2017-06-13 ENCOUNTER — Other Ambulatory Visit: Payer: Self-pay | Admitting: Medical

## 2017-06-13 MED ORDER — SILDENAFIL CITRATE 100 MG PO TABS: 50.0000 mg | ORAL_TABLET | Freq: Every day | ORAL | 1 refills | Status: DC | PRN

## 2017-07-13 ENCOUNTER — Other Ambulatory Visit: Payer: Self-pay | Admitting: Medical

## 2017-07-24 ENCOUNTER — Encounter: Payer: Self-pay | Admitting: Medical

## 2017-07-24 ENCOUNTER — Ambulatory Visit: Payer: BLUE CROSS/BLUE SHIELD | Admitting: Medical

## 2017-07-24 ENCOUNTER — Telehealth: Payer: Self-pay | Admitting: Medical

## 2017-07-24 VITALS — BP 139/83 | HR 53 | Temp 98.2°F | Resp 16 | Ht 66.0 in | Wt 215.8 lb

## 2017-07-24 DIAGNOSIS — M25552 Pain in left hip: Secondary | ICD-10-CM

## 2017-07-24 DIAGNOSIS — I10 Essential (primary) hypertension: Secondary | ICD-10-CM

## 2017-07-24 DIAGNOSIS — E781 Pure hyperglyceridemia: Secondary | ICD-10-CM | POA: Diagnosis not present

## 2017-07-24 DIAGNOSIS — M25551 Pain in right hip: Secondary | ICD-10-CM

## 2017-07-24 DIAGNOSIS — R739 Hyperglycemia, unspecified: Secondary | ICD-10-CM

## 2017-07-24 LAB — COMPREHENSIVE METABOLIC PANEL
ALK PHOS: 54 U/L (ref 39–117)
ALT: 22 U/L (ref 0–53)
AST: 17 U/L (ref 0–37)
Albumin: 3.7 g/dL (ref 3.5–5.2)
BILIRUBIN TOTAL: 0.4 mg/dL (ref 0.2–1.2)
BUN: 15 mg/dL (ref 6–23)
CO2: 26 meq/L (ref 19–32)
Calcium: 8.8 mg/dL (ref 8.4–10.5)
Chloride: 105 mEq/L (ref 96–112)
Creatinine, Ser: 0.77 mg/dL (ref 0.40–1.50)
GFR: 108.88 mL/min (ref >60.00)
Glucose, Bld: 121 mg/dL — ABNORMAL HIGH (ref 70–99)
Potassium: 4.2 mEq/L (ref 3.5–5.1)
SODIUM: 138 meq/L (ref 135–145)
TOTAL PROTEIN: 6.9 g/dL (ref 6.0–8.3)

## 2017-07-24 LAB — LIPID PANEL
CHOL/HDL RATIO: 7
Cholesterol: 148 mg/dL (ref 0–200)
HDL: 21.8 mg/dL — ABNORMAL LOW (ref >39.00)
LDL Cholesterol: 88 mg/dL (ref 0–99)
NONHDL: 126
Triglycerides: 190 mg/dL — ABNORMAL HIGH (ref 0.0–149.0)
VLDL: 38 mg/dL (ref 0.0–40.0)

## 2017-07-24 NOTE — Telephone Encounter (Signed)
Future a1c placed for elevated blood sugar.

## 2017-07-24 NOTE — Patient Instructions (Signed)
For your hypertension will refill you losartan. Bp moderate well controlled. Continue bp medication/refilled today.  For high triglycerides will get lipid panel today fasting.  For hip pain and hx of bilateral hip replacements, I do think reasonable for you to have a placard.  Follow up in 3 months or as needed

## 2017-07-24 NOTE — Progress Notes (Signed)
Subjective:    Patient ID: Steve [name], male    DOB: September 10, 1955, 62 y.o.   MRN: [id number]  HPI  Pt in for follow up. His bp is moderate well controlled. Pt has not checked his blood pressure. He did forget to take bp med this morning but did check today.  He does remember last time he checked his bp was 124/74.  Pt does request possible getting placard for parking. Pt has bilateral hip replacement. Left side done in 2001. Rt side replaced in 2005 then replaced after he got staph infection. Pt states on his feet all day long. To get into plant he is getting stuck having to walk maybe even 1/4 of a mile. Also expanding parking lock and may have to walk even further.  Pt has history of high triglycerides. He admits not eating real strict since I last saw him.  Pt is fasting.    Review of Systems  Constitutional: Negative for chills, fatigue and fever.  HENT: Negative for congestion, drooling, ear pain, mouth sores, nosebleeds and sinus pain.   Respiratory: Negative for cough, chest tightness, shortness of breath and wheezing.   Cardiovascular: Negative for chest pain and leg swelling.  Gastrointestinal: Negative for abdominal pain.  Musculoskeletal: Negative for back pain, joint swelling and neck pain.       Bilateral hip pain on and off.  Skin: Negative for rash.  Neurological: Negative for dizziness, seizures, weakness and light-headedness.  Hematological: Negative for adenopathy. Does not bruise/bleed easily.  Psychiatric/Behavioral: Negative for behavioral problems, confusion and dysphoric mood.   Past Medical History:  Diagnosis Date  . Abscess of muscle 08/10/2011   staph infection of right hip   . GERD (gastroesophageal reflux disease)    rare reflux - no meds for reflux - NO PROBLEM IN PAST SEVERAL YRS  . Hip dysplasia, congenital    no surgery as a child for hip dysplasia - has had bilateral hip replacements as an adult  . Pancreatitis   . Postoperative anemia due  to acute blood loss 09/07/2012  . Septic arthritis of hip ([place]) 09/05/2012   PT'S TOTAL HIP JOINT REMOVED - ANTIBIOTIC SPACE PLACED AND PT HAS FINISHED IV ANTIBIOTICS ( PICC LINE REMOVED)  . Sleep apnea    USES CPAP     Social History   Socioeconomic History  . Marital status: Married    Spouse name: Not on file  . Number of children: Not on file  . Years of education: Not on file  . Highest education level: Not on file  Occupational History  . Not on file  Social Needs  . Financial resource strain: Not on file  . Food insecurity:    Worry: Not on file    Inability: Not on file  . Transportation needs:    Medical: Not on file    Non-medical: Not on file  Tobacco Use  . Smoking status: Never Smoker  . Smokeless tobacco: Never Used  Substance and Sexual Activity  . Alcohol use: Yes    Alcohol/week: 0.0 oz    Comment: rare beer. 5 times a year at most. 2 beers when he drinks.  . Drug use: No  . Sexual activity: Yes    Partners: Female  Lifestyle  . Physical activity:    Days per week: Not on file    Minutes per session: Not on file  . Stress: Not on file  Relationships  . Social connections:    Talks on phone:  Not on file    Gets together: Not on file    Attends religious service: Not on file    Active member of club or organization: Not on file    Attends meetings of clubs or organizations: Not on file    Relationship status: Not on file  . Intimate partner violence:    Fear of current or ex partner: Not on file    Emotionally abused: Not on file    Physically abused: Not on file    Forced sexual activity: Not on file  Other Topics Concern  . 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 COLONOSCOPY  04/26/2007  . HERNIA REPAIR     inguinal hernia x3  . JOINT REPLACEMENT  2002 & 2007   bilateral hip replacement  . MULTIPLE  EXTRACTIONS WITH ALVEOLOPLASTY  07/28/2011   Procedure: MULTIPLE EXTRACION WITH ALVEOLOPLASTY;  Surgeon: Lenn Cal, DDS;  Location: [facility];  Service: Oral Surgery;  Laterality: N/A;  Extraction of tooth #'s 2,3,4,5,6,11,12,13,15,19,22 with alveoloplasty.  . shoulder repair - right for separation of shoulder    . TEE WITHOUT CARDIOVERSION  07/29/2011   Procedure: TRANSESOPHAGEAL ECHOCARDIOGRAM (TEE);  Surgeon: Josue Hector, MD;  Location: [place];  Service: Cardiovascular;  Laterality: N/A;  . TOTAL HIP REVISION Right 09/05/2012   Procedure: RIGHT HIP RESECTION ARTHROPLASTY WITH ANTIBIOTIC SPACERS;  Surgeon: [name], MD;  [facility];  Service: Orthopedics;  Laterality: Right;  . TOTAL HIP REVISION Right 11/30/2012   Procedure: RIGHT TOTAL HIP ARTHROPLASTY REIMPLANTATION;  Surgeon: [name], MD;  [facility];  Service: Orthopedics;  Laterality: Right;    Family History  Problem Relation Age of Onset  . Melanoma Mother   . Heart attack Mother   . Hyperlipidemia Neg Hx   . Sudden death Neg Hx   . Hypertension Neg Hx   . Diabetes Neg Hx     No Known Allergies  Current Outpatient Medications on File Prior to Visit  Medication Sig Dispense Refill  . sildenafil (VIAGRA) 100 MG tablet Take 0.5-1 tablets (50-100 mg total) by mouth daily as needed for erectile dysfunction. 10 tablet 1  . testosterone (ANDRODERM) 4 MG/24HR PT24 patch Place 1 patch onto the skin daily. 30 patch 2  . losartan (COZAAR) 100 MG tablet TAKE 1 TABLET BY MOUTH EVERY DAY 90 tablet 0   No current facility-administered medications on file prior to visit.     BP 139/83   Pulse (!) 53   Temp 98.2 F (36.8 C) (Oral)   Resp 16   Ht 5\' 6"  (1.676 m)   Wt 215 lb 12.8 oz (97.9 kg)   SpO2 97%   BMI 34.83 kg/m       Objective:   Physical Exam  General Mental Status- Alert. General Appearance- Not in acute distress.   Skin General: Color- Normal Color. Moisture- Normal  Moisture.  Neck Carotid Arteries- Normal color. Moisture- Normal Moisture. No carotid bruits. No JVD.  Chest and Lung Exam Auscultation: Breath Sounds:-Normal.  Cardiovascular Auscultation:Rythm- Regular. Murmurs & Other Heart Sounds:Auscultation of the heart reveals- No Murmurs.   Neurologic Cranial Nerve exam:- CN III-XII intact(No nystagmus), symmetric smile. Strength:- 5/5 equal and symmetric strength both upper and lower extremities.     Assessment & Plan:  For your hypertension will refill  you losartan. Bp moderate well controlled. Continue bp medication/refilled today.  For high triglycerides will get lipid panel today fasting.  For hip pain and hx of bilateral hip replacements, I do think reasonable for you to have a placard.  Follow up in 3 months or as needed  [employer], [employer]

## 2017-08-02 ENCOUNTER — Telehealth: Payer: Self-pay | Admitting: Medical

## 2017-08-02 ENCOUNTER — Other Ambulatory Visit (INDEPENDENT_AMBULATORY_CARE_PROVIDER_SITE_OTHER): Payer: BLUE CROSS/BLUE SHIELD

## 2017-08-02 DIAGNOSIS — R739 Hyperglycemia, unspecified: Secondary | ICD-10-CM

## 2017-08-02 LAB — HEMOGLOBIN A1C: HEMOGLOBIN A1C: 7 % — AB (ref 4.6–6.5)

## 2017-08-02 MED ORDER — METFORMIN HCL 500 MG PO TABS: 500.0000 mg | ORAL_TABLET | Freq: Two times a day (BID) | ORAL | 3 refills | Status: DC

## 2017-08-02 NOTE — Telephone Encounter (Signed)
rx metformin sent to pt pharmacy

## 2017-08-02 NOTE — Telephone Encounter (Signed)
Completed Physician section on Disability Parking Placard Application; forwarded to provider/SLS 04/24

## 2017-08-02 NOTE — Telephone Encounter (Signed)
Copied from [place] [id number]. Topic: Quick Communication - See Telephone Encounter >> Aug 02, 2017  7:57 AM Rosalin Hawking wrote: CRM for notification. See Telephone encounter for: 08/02/17.    PT dropped off document to be filled out by provider (Disability Parking Placard - 1 page) Pt would like to be called when document ready at his cell phone. Document given to [facility].

## 2017-08-03 ENCOUNTER — Other Ambulatory Visit: Payer: BLUE CROSS/BLUE SHIELD

## 2017-08-08 ENCOUNTER — Telehealth: Payer: Self-pay | Admitting: Medical

## 2017-08-08 NOTE — Telephone Encounter (Signed)
Filled out pt disability placard. Filled out for 5 years based on orthopedic condition. Hip replacement.  You can fax tomorrow.

## 2017-08-09 NOTE — Telephone Encounter (Addendum)
Placards are not faxed, either mailed or patient pick-up; will call patient to let him know it is ready/SLS 05/01 Received recording that "person has a mailbox that has not been set-up"; will attempt later before placing in mail/SLS 05/01

## 2017-08-18 NOTE — Telephone Encounter (Signed)
Reached patient by phone and informed him that we had attempted to get in touch with him earlier but his "mailbox had not been set up", he agreed and understood; patient chose to have placard application mailed to him/SLS 05/10

## 2017-10-09 ENCOUNTER — Encounter: Payer: Self-pay | Admitting: Medical

## 2017-10-18 ENCOUNTER — Encounter: Payer: Self-pay | Admitting: Medical

## 2017-10-18 ENCOUNTER — Ambulatory Visit: Payer: BLUE CROSS/BLUE SHIELD | Admitting: Medical

## 2017-10-18 VITALS — BP 126/70 | HR 68 | Temp 98.2°F | Resp 16 | Ht 66.0 in | Wt 193.0 lb

## 2017-10-18 DIAGNOSIS — E119 Type 2 diabetes mellitus without complications: Secondary | ICD-10-CM

## 2017-10-18 DIAGNOSIS — T7840XA Allergy, unspecified, initial encounter: Secondary | ICD-10-CM

## 2017-10-18 MED ORDER — PREDNISONE 10 MG PO TABS: ORAL_TABLET | ORAL | 0 refills | Status: DC

## 2017-10-18 MED ORDER — METHYLPREDNISOLONE ACETATE 40 MG/ML IJ SUSP
40.0000 mg | Freq: Once | INTRAMUSCULAR | Status: AC
  Administered 2017-10-18: 40 mg via INTRAMUSCULAR

## 2017-10-18 MED ORDER — HYDROXYZINE HCL 25 MG PO TABS: 25.0000 mg | ORAL_TABLET | Freq: Three times a day (TID) | ORAL | 0 refills | Status: DC | PRN

## 2017-10-18 NOTE — Progress Notes (Signed)
Subjective:    Patient ID: Steve [name], male    DOB: 1955-06-16, 62 y.o.   MRN: [id number]  HPI  Pt in with moderate to severe rash on medial forearms. At first got itching then gradually got rash.   Pt not sure of cause. No definite association with exposure.  No wheezing or shortness of breath.   Pt clarifies that his forearms have been itching for about a month.Rash present past 3 days.  Pt does not drink much alcohol. Last time drank was in April. Most of time just 1-2 beers.  Pt has recent rash that itches. Reports rash occurred after no known particular exposure. On review pt does not report any suspicious exposure to soaps, creams, detergents, make up, lotions, detergents, animal  exposure, plants or insect bites.  Pt rash is in the area of forearms and lower back. . Pt reports no shortness of breath or wheezing. . Pt is  Diabetic.   Pt does note that he works in very hot environment with poor air circulation. He sweats a lot at work.   Review of Systems  Constitutional: Negative for chills, diaphoresis, fatigue and fever.  Respiratory: Negative for cough, chest tightness, shortness of breath and wheezing.   Cardiovascular: Negative for chest pain and palpitations.  Gastrointestinal: Negative for abdominal pain.  Musculoskeletal: Negative for back pain.  Skin: Positive for rash.       See hpi.  Neurological: Negative for dizziness, weakness and headaches.  Hematological: Negative for adenopathy. Does not bruise/bleed easily.  Psychiatric/Behavioral: Negative for behavioral problems, confusion and sleep disturbance. The patient is not nervous/anxious.    Past Medical History:  Diagnosis Date  . Abscess of muscle 08/10/2011   staph infection of right hip   . GERD (gastroesophageal reflux disease)    rare reflux - no meds for reflux - NO PROBLEM IN PAST SEVERAL YRS  . Hip dysplasia, congenital    no surgery as a child for hip dysplasia - has had bilateral hip  replacements as an adult  . Pancreatitis   . Postoperative anemia due to acute blood loss 09/07/2012  . Septic arthritis of hip ([place]) 09/05/2012   PT'S TOTAL HIP JOINT REMOVED - ANTIBIOTIC SPACE PLACED AND PT HAS FINISHED IV ANTIBIOTICS ( PICC LINE REMOVED)  . Sleep apnea    USES CPAP     Social History   Socioeconomic History  . Marital status: Married    Spouse name: Not on file  . Number of children: Not on file  . Years of education: Not on file  . Highest education level: Not on file  Occupational History  . Not on file  Social Needs  . Financial resource strain: Not on file  . Food insecurity:    Worry: Not on file    Inability: Not on file  . Transportation needs:    Medical: Not on file    Non-medical: Not on file  Tobacco Use  . Smoking status: Never Smoker  . Smokeless tobacco: Never Used  Substance and Sexual Activity  . Alcohol use: Yes    Comment: rare beer. 5 times a year at most. 2 beers when he drinks.  . Drug use: No  . Sexual activity: Yes    Partners: Female  Lifestyle  . Physical activity:    Days per week: Not on file    Minutes per session: Not on file  . Stress: Not on file  Relationships  . Social connections:  Talks on phone: Not on file    Gets together: Not on file    Attends religious service: Not on file    Active member of club or organization: Not on file    Attends meetings of clubs or organizations: Not on file    Relationship status: Not on file  . Intimate partner violence:    Fear of current or ex partner: Not on file    Emotionally abused: Not on file    Physically abused: Not on file    Forced sexual activity: Not on file  Other Topics Concern  . 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 COLONOSCOPY  04/26/2007  . HERNIA REPAIR     inguinal hernia x3  . JOINT REPLACEMENT  2002 & 2007    bilateral hip replacement  . MULTIPLE EXTRACTIONS WITH ALVEOLOPLASTY  07/28/2011   Procedure: MULTIPLE EXTRACION WITH ALVEOLOPLASTY;  Surgeon: Lenn Cal, DDS;  [facility];  Service: Oral Surgery;  Laterality: N/A;  Extraction of tooth #'s 2,3,4,5,6,11,12,13,15,19,22 with alveoloplasty.  . shoulder repair - right for separation of shoulder    . TEE WITHOUT CARDIOVERSION  07/29/2011   Procedure: TRANSESOPHAGEAL ECHOCARDIOGRAM (TEE);  Surgeon: Josue Hector, MD;  Location: [place];  Service: Cardiovascular;  Laterality: N/A;  . TOTAL HIP REVISION Right 09/05/2012   Procedure: RIGHT HIP RESECTION ARTHROPLASTY WITH ANTIBIOTIC SPACERS;  Surgeon: [name], MD;  [facility];  Service: Orthopedics;  Laterality: Right;  . TOTAL HIP REVISION Right 11/30/2012   Procedure: RIGHT TOTAL HIP ARTHROPLASTY REIMPLANTATION;  Surgeon: [name], MD;  [facility];  Service: Orthopedics;  Laterality: Right;    Family History  Problem Relation Age of Onset  . Melanoma Mother   . Heart attack Mother   . Hyperlipidemia Neg Hx   . Sudden death Neg Hx   . Hypertension Neg Hx   . Diabetes Neg Hx     No Known Allergies  Current Outpatient Medications on File Prior to Visit  Medication Sig Dispense Refill  . losartan (COZAAR) 100 MG tablet TAKE 1 TABLET BY MOUTH EVERY DAY 90 tablet 0  . metFORMIN (GLUCOPHAGE) 500 MG tablet Take 1 tablet (500 mg total) by mouth 2 (two) times daily with a meal. 60 tablet 3  . sildenafil (VIAGRA) 100 MG tablet Take 0.5-1 tablets (50-100 mg total) by mouth daily as needed for erectile dysfunction. 10 tablet 1  . testosterone (ANDRODERM) 4 MG/24HR PT24 patch Place 1 patch onto the skin daily. 30 patch 2   No current facility-administered medications on file prior to visit.     BP 126/70   Pulse 68   Temp 98.2 F (36.8 C) (Oral)   Resp 16   Ht 5\' 6"  (1.676 m)   Wt 193 lb (87.5 kg)   SpO2 97%   BMI 31.15 kg/m       Objective:    Physical Exam  General Mental Status- Alert. General Appearance- Not in acute distress.     Neck Carotid Arteries- Normal color. Moisture- Normal Moisture. No carotid bruits. No JVD.  Chest and Lung Exam Auscultation: Breath Sounds:-Normal.  Cardiovascular Auscultation:Rythm- Regular. Murmurs & Other Heart Sounds:Auscultation of the heart reveals- No Murmurs.   Neurologic Cranial Nerve exam:- CN III-XII intact(No nystagmus), symmetric smile. Normal/IntactStrength:- 5/5 equal and symmetric strength  both upper and lower extremities.  Derm- moderate to severe papular eruption medial forearm. Mild lower back rash papular rash as well. No redness or warmth.     Assessment & Plan:  The exact etiology of your  allergic reaction is  unknown . We gave you 40  depo-medrol im injection. I am also prescribing oral prednisone and hydroxyzine for itching. Your rash should gradually improve. If worsening or expanding  please notify us. If your rash reoccurs intermittently and no cause is identified then could consider allergist referral.  Make sure keep skin cool and well moisturized.  You do have diabetes and recommend that you continue metformin.  Your blood sugar today on exam was 110.  We are having to give you prednisone for the allergic reaction.  Please eat low sugar diet while on prednisone as sugr can increase with steroid use.  Check your blood sugar at least once daily over the next 5 days.  You feel symptomatic such as lightheaded, frequent urination or  increased thirst then check blood sugar as well.  If the blood sugars are over 200 please let me know.  And that event would provide you with sliding scale insulin pen to use before meals.  Follow up this Monday for diabetic follow-up or as needed.  Will check your rash on follow-up as well.  [name], PA-C

## 2017-10-18 NOTE — Patient Instructions (Addendum)
The exact etiology of your  allergic reaction is  unknown . We gave you 40  depo-medrol im injection. I am also prescribing oral prednisone and hydroxyzine for itching. Your rash should gradually improve. If worsening or expanding  please notify us. If your rash reoccurs intermittently and no cause is identified then could consider allergist referral.  Make sure keep skin cool and well moisturized.  You do have diabetes and recommend that you continue metformin.  Your blood sugar today on exam was 110.  We are having to give you prednisone for the allergic reaction.  Please eat low sugar diet while on prednisone as sugar can increase with steroid use.  Check your blood sugar at least once daily over the next 5 days.  You feel symptomatic such as lightheaded, frequent urination or  increased thirst then check blood sugar as well.  If the blood sugars are over 200 please let me know.  And that event would provide you with sliding scale insulin pen to use before meals.  Follow up this Monday for diabetic follow-up or as needed.  Will check your rash on follow-up as well.

## 2017-10-19 ENCOUNTER — Other Ambulatory Visit: Payer: Self-pay | Admitting: Medical

## 2017-10-19 ENCOUNTER — Telehealth: Payer: Self-pay

## 2017-10-19 NOTE — Telephone Encounter (Signed)
I rx'd in 5 day taper pack. If you call pharmacist and clarify what issue is. If she wants to dispense different such as 6 day dose pack.  6 tab po day 1. 5 tabs day 2, 4 tab day 3, 3 tab day 4, 2 tab po day 5, 1 tab po day 6. Tab dose would be 10 mg.  That would be fine.

## 2017-10-19 NOTE — Telephone Encounter (Signed)
Copied from [place] [id number]. Topic: Quick Communication - See Telephone Encounter >> Oct 18, 2017  4:46 PM Antonieta Iba C wrote: CRM for notification. See Telephone encounter for: 10/18/17.   Lovena Le w/ [facility] pharmacy called in to get clarification on pt's predniSONE (DELTASONE) 10 MG tablet Rx, she want to know if provider meant to send in the dose pack instead?    Please advise.   CB: [phone]

## 2017-10-20 NOTE — Telephone Encounter (Signed)
Called Rx into pharmacy.  

## 2017-10-23 ENCOUNTER — Ambulatory Visit: Payer: BLUE CROSS/BLUE SHIELD | Admitting: Medical

## 2017-10-23 ENCOUNTER — Encounter: Payer: Self-pay | Admitting: Medical

## 2017-10-23 VITALS — BP 130/70 | HR 57 | Temp 98.9°F | Resp 16 | Ht 66.0 in | Wt 192.2 lb

## 2017-10-23 DIAGNOSIS — I10 Essential (primary) hypertension: Secondary | ICD-10-CM | POA: Diagnosis not present

## 2017-10-23 DIAGNOSIS — E781 Pure hyperglyceridemia: Secondary | ICD-10-CM | POA: Diagnosis not present

## 2017-10-23 DIAGNOSIS — T7840XA Allergy, unspecified, initial encounter: Secondary | ICD-10-CM

## 2017-10-23 DIAGNOSIS — E119 Type 2 diabetes mellitus without complications: Secondary | ICD-10-CM

## 2017-10-23 MED ORDER — PREDNISONE 10 MG PO TABS: ORAL_TABLET | ORAL | 0 refills | Status: DC

## 2017-10-23 NOTE — Progress Notes (Signed)
Subjective:    Patient ID: Steve [name], male    DOB: [birth year], 62 y.o.   MRN: [id number]  HPI  Pt in for follow up.  He does state report rash for which I saw him for yesterday I getting better. Less itch and much less prominent.(he did not get oral prednisone filled). Had issue with script. I accidentlally wrote him wrong number of tabs.   Pt last a1c was 7.0. Recent sugars since I last saw him past week were in 90's.   Pt admits he did not check his blood sugar   Pt bp has been better at home. Was 128/74.Slight high bp today on initial check but then was better.    Review of Systems  Constitutional: Negative for chills, fatigue and fever.  HENT: Negative for congestion, drooling and ear pain.   Respiratory: Negative for cough, chest tightness, shortness of breath and wheezing.   Cardiovascular: Negative for chest pain and palpitations.  Gastrointestinal: Negative for abdominal pain.  Genitourinary: Negative for difficulty urinating, flank pain, frequency, penile swelling and testicular pain.  Musculoskeletal: Negative for arthralgias, back pain and joint swelling.  Skin: Positive for rash.  Neurological: Negative for dizziness and headaches.  Hematological: Negative for adenopathy. Does not bruise/bleed easily.  Psychiatric/Behavioral: Negative for behavioral problems, confusion and suicidal ideas. The patient is not nervous/anxious.     Past Medical History:  Diagnosis Date  . Abscess of muscle 08/10/2011   staph infection of right hip   . GERD (gastroesophageal reflux disease)    rare reflux - no meds for reflux - NO PROBLEM IN PAST SEVERAL YRS  . Hip dysplasia, congenital    no surgery as a child for hip dysplasia - has had bilateral hip replacements as an adult  . Pancreatitis   . Postoperative anemia due to acute blood loss 09/07/2012  . Septic arthritis of hip ([place]) 09/05/2012   PT'S TOTAL HIP JOINT REMOVED - ANTIBIOTIC SPACE PLACED AND PT HAS FINISHED IV  ANTIBIOTICS ( PICC LINE REMOVED)  . Sleep apnea    USES CPAP     Social History   Socioeconomic History  . Marital status: Married    Spouse name: Not on file  . Number of children: Not on file  . Years of education: Not on file  . Highest education level: Not on file  Occupational History  . Not on file  Social Needs  . Financial resource strain: Not on file  . Food insecurity:    Worry: Not on file    Inability: Not on file  . Transportation needs:    Medical: Not on file    Non-medical: Not on file  Tobacco Use  . Smoking status: Never Smoker  . Smokeless tobacco: Never Used  Substance and Sexual Activity  . Alcohol use: Yes    Comment: rare beer. 5 times a year at most. 2 beers when he drinks.  . Drug use: No  . Sexual activity: Yes    Partners: Female  Lifestyle  . Physical activity:    Days per week: Not on file    Minutes per session: Not on file  . Stress: Not on file  Relationships  . Social connections:    Talks on phone: Not on file    Gets together: Not on file    Attends religious service: Not on file    Active member of club or organization: Not on file    Attends meetings of clubs or organizations: Not  on file    Relationship status: Not on file  . Intimate partner violence:    Fear of current or ex partner: Not on file    Emotionally abused: Not on file    Physically abused: Not on file    Forced sexual activity: Not on file  Other Topics Concern  . 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 COLONOSCOPY  04/26/2007  . HERNIA REPAIR     inguinal hernia x3  . JOINT REPLACEMENT  2002 & 2007   bilateral hip replacement  . MULTIPLE EXTRACTIONS WITH ALVEOLOPLASTY  07/28/2011   Procedure: MULTIPLE EXTRACION WITH ALVEOLOPLASTY;  Surgeon: Lenn Cal, DDS;  Location: [facility];  Service: Oral Surgery;  Laterality:  N/A;  Extraction of tooth #'s 2,3,4,5,6,11,12,13,15,19,22 with alveoloplasty.  . shoulder repair - right for separation of shoulder    . TEE WITHOUT CARDIOVERSION  07/29/2011   Procedure: TRANSESOPHAGEAL ECHOCARDIOGRAM (TEE);  Surgeon: Josue Hector, MD;  Location: [facility];  Service: Cardiovascular;  Laterality: N/A;  . TOTAL HIP REVISION Right 09/05/2012   Procedure: RIGHT HIP RESECTION ARTHROPLASTY WITH ANTIBIOTIC SPACERS;  Surgeon: [name], MD;  Location: [facility];  Service: Orthopedics;  Laterality: Right;  . TOTAL HIP REVISION Right 11/30/2012   Procedure: RIGHT TOTAL HIP ARTHROPLASTY REIMPLANTATION;  Surgeon: [name], MD;  Location: [facility];  Service: Orthopedics;  Laterality: Right;    Family History  Problem Relation Age of Onset  . Melanoma Mother   . Heart attack Mother   . Hyperlipidemia Neg Hx   . Sudden death Neg Hx   . Hypertension Neg Hx   . Diabetes Neg Hx     No Known Allergies  Current Outpatient Medications on File Prior to Visit  Medication Sig Dispense Refill  . hydrOXYzine (ATARAX/VISTARIL) 25 MG tablet Take 1 tablet (25 mg total) by mouth 3 (three) times daily as needed. 30 tablet 0  . losartan (COZAAR) 100 MG tablet TAKE 1 TABLET BY MOUTH EVERY DAY 90 tablet 0  . metFORMIN (GLUCOPHAGE) 500 MG tablet Take 1 tablet (500 mg total) by mouth 2 (two) times daily with a meal. 60 tablet 3  . predniSONE (DELTASONE) 10 MG tablet 5 TAB PO DAY 1 4 TAB PO DAY 2 3 TAB PO DAY 3 2 TAB PO DAY 4 1 TAB PO DAY 5 21 tablet 0  . sildenafil (VIAGRA) 100 MG tablet Take 0.5-1 tablets (50-100 mg total) by mouth daily as needed for erectile dysfunction. 10 tablet 1  . testosterone (ANDRODERM) 4 MG/24HR PT24 patch Place 1 patch onto the skin daily. 30 patch 2   No current facility-administered medications on file prior to visit.     BP 124/79   Pulse (!) 57   Temp 98.9 F (37.2 C) (Oral)   Resp 16   Ht 5\' 6"  (1.676 m)   Wt 192 lb 3.2 oz (87.2 kg)   SpO2 98%    BMI 31.02 kg/m       Objective:   Physical Exam  General Mental Status- Alert. General Appearance- Not in acute distress.   Skin General: Color- Normal Color. Moisture- Normal Moisture.  Neck Carotid Arteries- Normal color. Moisture- Normal Moisture. No carotid bruits. No JVD.  Chest and Lung Exam Auscultation: Breath Sounds:-Normal.  Cardiovascular Auscultation:Rythm- Regular. Murmurs & Other Heart Sounds:Auscultation  of the heart reveals- No Murmurs.  Abdomen Inspection:-Inspeection Normal. Palpation/Percussion:Note:No mass. Palpation and Percussion of the abdomen reveal- Non Tender, Non Distended + BS, no rebound or guarding.  Rash- on forearms much less intense less raised as before.   Neurologic Cranial Nerve exam:- CN III-XII intact(No nystagmus), symmetric smile. Strength:- 5/5 equal and symmetric strength both upper and lower extremities.  Lower ext- see quality metrics.     Assessment & Plan:  For your recent allergic reaction, the rash does look improved.  But issue with the prednisone prescription.  So I will go ahead and write another 5day prescription and give you the printed copy.  For diabetes, I did place order for future CMP and A1c.  Reminder to get around the week of July 29.  That way the past 90days.  Continue current medication and a low sugar diet.  Also will check your cholesterol level/your triglycerides.  Your hypertension/blood pressure level is at a good level today.  Continue current medication.  Follow-up date to be determined after lab review.  [name], PA-C

## 2017-10-23 NOTE — Patient Instructions (Signed)
For your recent allergic reaction, the rash does look improved.  But issue with the prednisone prescription.  So I will go ahead and write another 5day prescription and give you the printed copy.  For diabetes, I did place order for future CMP and A1c.  Reminder to get around the week of July 29.  That way the past 90days.  Continue current medication and a low sugar diet.  Also will check your cholesterol level/your triglycerides.  Your hypertension/blood pressure level is at a good level today.  Continue current medication.  Follow-up date to be determined after lab review.

## 2017-10-28 ENCOUNTER — Other Ambulatory Visit: Payer: Self-pay | Admitting: Medical

## 2017-11-06 ENCOUNTER — Other Ambulatory Visit (INDEPENDENT_AMBULATORY_CARE_PROVIDER_SITE_OTHER): Payer: BLUE CROSS/BLUE SHIELD

## 2017-11-06 ENCOUNTER — Other Ambulatory Visit: Payer: Self-pay | Admitting: Medical

## 2017-11-06 DIAGNOSIS — E781 Pure hyperglyceridemia: Secondary | ICD-10-CM

## 2017-11-06 DIAGNOSIS — I10 Essential (primary) hypertension: Secondary | ICD-10-CM | POA: Diagnosis not present

## 2017-11-06 DIAGNOSIS — E119 Type 2 diabetes mellitus without complications: Secondary | ICD-10-CM | POA: Diagnosis not present

## 2017-11-06 LAB — COMPREHENSIVE METABOLIC PANEL
ALBUMIN: 3.8 g/dL (ref 3.5–5.2)
ALK PHOS: 43 U/L (ref 39–117)
ALT: 14 U/L (ref 0–53)
AST: 13 U/L (ref 0–37)
BUN: 20 mg/dL (ref 6–23)
CALCIUM: 8.9 mg/dL (ref 8.4–10.5)
CO2: 30 mEq/L (ref 19–32)
Chloride: 105 mEq/L (ref 96–112)
Creatinine, Ser: 0.93 mg/dL (ref 0.40–1.50)
GFR: 87.48 mL/min (ref >60.00)
Glucose, Bld: 113 mg/dL — ABNORMAL HIGH (ref 70–99)
POTASSIUM: 4.5 meq/L (ref 3.5–5.1)
Sodium: 140 mEq/L (ref 135–145)
TOTAL PROTEIN: 6.5 g/dL (ref 6.0–8.3)
Total Bilirubin: 0.5 mg/dL (ref 0.2–1.2)

## 2017-11-06 LAB — LIPID PANEL
CHOLESTEROL: 129 mg/dL (ref 0–200)
HDL: 27.7 mg/dL — ABNORMAL LOW (ref >39.00)
LDL Cholesterol: 76 mg/dL (ref 0–99)
NONHDL: 101.7
TRIGLYCERIDES: 127 mg/dL (ref 0.0–149.0)
Total CHOL/HDL Ratio: 5
VLDL: 25.4 mg/dL (ref 0.0–40.0)

## 2017-11-06 LAB — HEMOGLOBIN A1C: HEMOGLOBIN A1C: 6.4 % (ref 4.6–6.5)

## 2018-01-30 ENCOUNTER — Other Ambulatory Visit: Payer: Self-pay | Admitting: Medical

## 2018-05-04 ENCOUNTER — Other Ambulatory Visit: Payer: Self-pay | Admitting: Medical

## 2018-05-04 NOTE — Telephone Encounter (Signed)
Pt due for follow up please call and schedule appointment.  

## 2018-05-08 NOTE — Telephone Encounter (Signed)
LVM for pt to schedule an appt with PCP, per PCP pt is due for fu appt.

## 2018-07-26 ENCOUNTER — Encounter: Payer: Self-pay | Admitting: Medical

## 2018-07-26 ENCOUNTER — Ambulatory Visit (INDEPENDENT_AMBULATORY_CARE_PROVIDER_SITE_OTHER): Payer: BLUE CROSS/BLUE SHIELD | Admitting: Medical

## 2018-07-26 ENCOUNTER — Other Ambulatory Visit: Payer: Self-pay

## 2018-07-26 VITALS — BP 120/83

## 2018-07-26 DIAGNOSIS — I10 Essential (primary) hypertension: Secondary | ICD-10-CM

## 2018-07-26 DIAGNOSIS — E119 Type 2 diabetes mellitus without complications: Secondary | ICD-10-CM | POA: Diagnosis not present

## 2018-07-26 DIAGNOSIS — E781 Pure hyperglyceridemia: Secondary | ICD-10-CM | POA: Diagnosis not present

## 2018-07-26 MED ORDER — ATORVASTATIN CALCIUM 10 MG PO TABS: 10.0000 mg | ORAL_TABLET | Freq: Every day | ORAL | 3 refills | Status: DC

## 2018-07-26 MED ORDER — METFORMIN HCL 500 MG PO TABS: 500.0000 mg | ORAL_TABLET | Freq: Two times a day (BID) | ORAL | 1 refills | Status: DC

## 2018-07-26 MED ORDER — LOSARTAN POTASSIUM 100 MG PO TABS: 100.0000 mg | ORAL_TABLET | Freq: Every day | ORAL | 1 refills | Status: DC

## 2018-07-26 NOTE — Patient Instructions (Signed)
Your blood sugar level appear to be very well controlled.  Very well with metformin.  Glad to hear that you continue metformin and sent refill in today.  Your blood pressure also is very well controlled today.  We will refill losartan.  Your cholesterol levels have historically been pretty good but in light of diabetes diagnosis and strong recommendation regarding use of statins to prevent complications, I do think using low-dose atorvastatin would be a good idea.  Prescription sent to pharmacy.  Future labs placed to be done in 6 to 8 weeks.  CMP, lipid panel and A1c.  If restrictions regarding face-to-face/in person visits are recommend that you get complete physical exam at that time.  But follow-up date to be determined.

## 2018-07-26 NOTE — Progress Notes (Signed)
   Subjective:    Patient ID: Steve [name], male    DOB: 1956-03-31, 63 y.o.   MRN: [id number]  HPI  Virtual Visit via Video Note  I connected with Steve [name] on 07/26/18 at  1:00 PM EDT by a video enabled telemedicine application and verified that I am speaking with the correct person using two identifiers.   I discussed the limitations of evaluation and management by telemedicine and the availability of in person appointments. The patient expressed understanding and agreed to proceed.    History of Present Illness:  Pt doing follow up virtual visit.  Pt bp was controlled this morning at 120/83. He does need refill of losartan.  Pt sugar this am fasting 124. Pt states checking bs 3 times a day. His sugars are low 100-110 most of the time. He also needs refill of metformin.  Discussed today benefit of statin medication in patients with diabetes.     Observations/Objective: No acute distress.  Assessment and Plan: Your blood sugar level appear to be very well controlled.  Very well with metformin.  Glad to hear that you continue metformin and sent refill in today.  Your blood pressure also is very well controlled today.  We will refill losartan.  Your cholesterol levels have historically been pretty good but in light of diabetes diagnosis and strong recommendation regarding use of statins to prevent complications, I do think using low-dose atorvastatin would be a good idea.  Prescription sent to pharmacy.  Future labs placed to be done in 6 to 8 weeks.  CMP, lipid panel and A1c.  If restrictions regarding face-to-face/in person visits are recommend that you get complete physical exam at that time.  But follow-up date to be determined.  Follow Up Instructions:    I discussed the assessment and treatment plan with the patient. The patient was provided an opportunity to ask questions and all were answered. The patient agreed with the plan and demonstrated an  understanding of the instructions.   The patient was advised to call back or seek an in-person evaluation if the symptoms worsen or if the condition fails to improve as anticipated.     [name], PA-C    Review of Systems  Constitutional: Negative for chills, fatigue and fever.  Respiratory: Negative for chest tightness, shortness of breath and wheezing.   Cardiovascular: Negative for chest pain and palpitations.  Gastrointestinal: Negative for abdominal pain, constipation, rectal pain and vomiting.  Musculoskeletal: Negative for back pain and myalgias.  Skin: Negative for rash.  Hematological: Negative for adenopathy. Does not bruise/bleed easily.  Psychiatric/Behavioral: Negative for behavioral problems and confusion.       Objective:   Physical Exam See objective.       Assessment & Plan:

## 2018-10-18 ENCOUNTER — Other Ambulatory Visit: Payer: Self-pay | Admitting: Medical

## 2019-01-27 ENCOUNTER — Other Ambulatory Visit: Payer: Self-pay | Admitting: Medical

## 2019-01-28 NOTE — Telephone Encounter (Signed)
TRIED CALLING TO SCHEDULE APPOINTMENT PATIENT IS PAST DUE FOR OV AND LABS.  NO ANSWER AND VM FULL.

## 2019-02-08 ENCOUNTER — Other Ambulatory Visit: Payer: Self-pay

## 2019-02-08 ENCOUNTER — Ambulatory Visit (INDEPENDENT_AMBULATORY_CARE_PROVIDER_SITE_OTHER): Payer: BC Managed Care – PPO

## 2019-02-08 DIAGNOSIS — Z23 Encounter for immunization: Secondary | ICD-10-CM | POA: Diagnosis not present

## 2019-02-11 ENCOUNTER — Other Ambulatory Visit: Payer: Self-pay | Admitting: Medical

## 2019-02-12 NOTE — Telephone Encounter (Signed)
Pt due for follow up 

## 2019-02-12 NOTE — Telephone Encounter (Signed)
Pt scheduled fu appt on Nov 10,2020 with provider. Done

## 2019-02-12 NOTE — Telephone Encounter (Signed)
Pt schedule for fu appt on Feb 19, 2019. Done

## 2019-02-19 ENCOUNTER — Encounter: Payer: Self-pay | Admitting: Medical

## 2019-02-19 ENCOUNTER — Ambulatory Visit (INDEPENDENT_AMBULATORY_CARE_PROVIDER_SITE_OTHER): Payer: BC Managed Care – PPO | Admitting: Medical

## 2019-02-19 ENCOUNTER — Ambulatory Visit (HOSPITAL_BASED_OUTPATIENT_CLINIC_OR_DEPARTMENT_OTHER)
Admission: RE | Admit: 2019-02-19 | Discharge: 2019-02-19 | Disposition: A | Discharge status: Home / Self Care | Payer: BC Managed Care – PPO | Source: Ambulatory Visit | Attending: Medical | Admitting: Medical

## 2019-02-19 ENCOUNTER — Other Ambulatory Visit: Payer: Self-pay

## 2019-02-19 VITALS — BP 138/80 | HR 70 | Ht 66.0 in | Wt 189.0 lb

## 2019-02-19 DIAGNOSIS — E119 Type 2 diabetes mellitus without complications: Secondary | ICD-10-CM | POA: Diagnosis not present

## 2019-02-19 DIAGNOSIS — R221 Localized swelling, mass and lump, neck: Secondary | ICD-10-CM

## 2019-02-19 DIAGNOSIS — I10 Essential (primary) hypertension: Secondary | ICD-10-CM

## 2019-02-19 DIAGNOSIS — E781 Pure hyperglyceridemia: Secondary | ICD-10-CM | POA: Diagnosis not present

## 2019-02-19 LAB — CBC WITH DIFFERENTIAL/PLATELET
Basophils Absolute: 0.1 10*3/uL (ref 0.0–0.1)
Basophils Relative: 1.2 % (ref 0.0–3.0)
Eosinophils Absolute: 0.3 10*3/uL (ref 0.0–0.7)
Eosinophils Relative: 2.7 % (ref 0.0–5.0)
HCT: 39 % (ref 39.0–52.0)
Hemoglobin: 13 g/dL (ref 13.0–17.0)
Lymphocytes Relative: 18.8 % (ref 12.0–46.0)
Lymphs Abs: 1.8 10*3/uL (ref 0.7–4.0)
MCHC: 33.2 g/dL (ref 30.0–36.0)
MCV: 86.5 fl (ref 78.0–100.0)
Monocytes Absolute: 0.8 10*3/uL (ref 0.1–1.0)
Monocytes Relative: 8.6 % (ref 3.0–12.0)
Neutro Abs: 6.6 10*3/uL (ref 1.4–7.7)
Neutrophils Relative %: 68.7 % (ref 43.0–77.0)
Platelets: 288 10*3/uL (ref 150.0–400.0)
RBC: 4.51 Mil/uL (ref 4.22–5.81)
RDW: 13.7 % (ref 11.5–15.5)
WBC: 9.6 10*3/uL (ref 4.0–10.5)

## 2019-02-19 LAB — COMPREHENSIVE METABOLIC PANEL
ALT: 13 U/L (ref 0–53)
AST: 14 U/L (ref 0–37)
Albumin: 4.1 g/dL (ref 3.5–5.2)
Alkaline Phosphatase: 69 U/L (ref 39–117)
BUN: 13 mg/dL (ref 6–23)
CO2: 30 mEq/L (ref 19–32)
Calcium: 9.1 mg/dL (ref 8.4–10.5)
Chloride: 102 mEq/L (ref 96–112)
Creatinine, Ser: 0.81 mg/dL (ref 0.40–1.50)
GFR: 96.14 mL/min (ref >60.00)
Glucose, Bld: 135 mg/dL — ABNORMAL HIGH (ref 70–99)
Potassium: 4.3 mEq/L (ref 3.5–5.1)
Sodium: 137 mEq/L (ref 135–145)
Total Bilirubin: 0.7 mg/dL (ref 0.2–1.2)
Total Protein: 6.9 g/dL (ref 6.0–8.3)

## 2019-02-19 LAB — LIPID PANEL
Cholesterol: 109 mg/dL (ref 0–200)
HDL: 26.5 mg/dL — ABNORMAL LOW (ref >39.00)
LDL Cholesterol: 63 mg/dL (ref 0–99)
NonHDL: 82.96
Total CHOL/HDL Ratio: 4
Triglycerides: 101 mg/dL (ref 0.0–149.0)
VLDL: 20.2 mg/dL (ref 0.0–40.0)

## 2019-02-19 LAB — HEMOGLOBIN A1C: Hgb A1c MFr Bld: 7 % — ABNORMAL HIGH (ref 4.6–6.5)

## 2019-02-19 NOTE — Patient Instructions (Addendum)
Your blood pressure is moderate well controlled today. Recommend continue to check at home. Glad to hear you numbers at home are 120/80.   For high triglycerides will get cmp and lipid panel today. Continue atrovastatin.  For diabetes will get 3 month sugar average test. Continue low sugar diet, exercise and metformin.   Please call GI office Dr. Carlean Purl to get scheduled for screening colonoscopy.  For neck area rt side possible mass will get [place] of neck and cbc. Will follow this description on study. May need to refer to ENT.  Follow up date to be determined after lab review.

## 2019-02-19 NOTE — Addendum Note (Signed)
Addended by: Anabel Halon on: 02/19/2019 09:09 AM   Modules accepted: Orders

## 2019-02-19 NOTE — Progress Notes (Addendum)
Subjective:    Patient ID: Steve [name], male    DOB: 16-Aug-1955, 63 y.o.   MRN: [id number]  HPI  Pt in for follow up. He has htn, high cholesterol a diabetes.   Last labs done in summer more than 3 months ago.  Pt bp little elevated today on initial check. No cardiac or neurologic signs or symptoms.  Pt has high cholesterol and is on on lipitor.  For diabetes he is on metformin.   Pt states never got colonoscopy in past. He states was going to repeat but got canceled due to covid.  Pt got flu vaccine just recently.  Also pt mentioned couple of weeks ago noticed asymetry to neck on rt side. Bulge mild. No fever, no chills or sweats. No pain.      Review of Systems  Constitutional: Negative for chills, fatigue and fever.  Respiratory: Negative for cough, chest tightness and shortness of breath.   Cardiovascular: Negative for chest pain and palpitations.  Gastrointestinal: Negative for abdominal pain.  Genitourinary: Negative for dysuria, flank pain, frequency, hematuria and testicular pain.  Musculoskeletal: Negative for back pain, joint swelling, myalgias and neck stiffness.  Skin: Negative for rash.  Neurological: Negative for dizziness, seizures, speech difficulty, weakness, light-headedness and headaches.  Hematological: Negative for adenopathy. Does not bruise/bleed easily.  Psychiatric/Behavioral: Negative for behavioral problems, decreased concentration, sleep disturbance and suicidal ideas. The patient is not nervous/anxious.     Past Medical History:  Diagnosis Date  . Abscess of muscle 08/10/2011   staph infection of right hip   . GERD (gastroesophageal reflux disease)    rare reflux - no meds for reflux - NO PROBLEM IN PAST SEVERAL YRS  . Hip dysplasia, congenital    no surgery as a child for hip dysplasia - has had bilateral hip replacements as an adult  . Pancreatitis   . Postoperative anemia due to acute blood loss 09/07/2012  . Septic arthritis of hip  ([place]) 09/05/2012   PT'S TOTAL HIP JOINT REMOVED - ANTIBIOTIC SPACE PLACED AND PT HAS FINISHED IV ANTIBIOTICS ( PICC LINE REMOVED)  . Sleep apnea    USES CPAP     Social History   Socioeconomic History  . Marital status: Married    Spouse name: Not on file  . Number of children: Not on file  . Years of education: Not on file  . Highest education level: Not on file  Occupational History  . Not on file  Social Needs  . Financial resource strain: Not on file  . Food insecurity    Worry: Not on file    Inability: Not on file  . Transportation needs    Medical: Not on file    Non-medical: Not on file  Tobacco Use  . Smoking status: Never Smoker  . Smokeless tobacco: Never Used  Substance and Sexual Activity  . Alcohol use: Yes    Comment: rare beer. 5 times a year at most. 2 beers when he drinks.  . Drug use: No  . Sexual activity: Yes    Partners: Female  Lifestyle  . Physical activity    Days per week: Not on file    Minutes per session: Not on file  . Stress: Not on file  Relationships  . Social Herbalist on phone: Not on file    Gets together: Not on file    Attends religious service: Not on file    Active member of club or organization:  Not on file    Attends meetings of clubs or organizations: Not on file    Relationship status: Not on file  . Intimate partner violence    Fear of current or ex partner: Not on file    Emotionally abused: Not on file    Physically abused: Not on file    Forced sexual activity: Not on file  Other Topics Concern  . 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 COLONOSCOPY  04/26/2007  . HERNIA REPAIR     inguinal hernia x3  . JOINT REPLACEMENT  2002 & 2007   bilateral hip replacement  . MULTIPLE EXTRACTIONS WITH ALVEOLOPLASTY  07/28/2011   Procedure: MULTIPLE EXTRACION WITH  ALVEOLOPLASTY;  Surgeon: Lenn Cal, DDS;  Location: [facility];  Service: Oral Surgery;  Laterality: N/A;  Extraction of tooth #'s 2,3,4,5,6,11,12,13,15,19,22 with alveoloplasty.  . shoulder repair - right for separation of shoulder    . TEE WITHOUT CARDIOVERSION  07/29/2011   Procedure: TRANSESOPHAGEAL ECHOCARDIOGRAM (TEE);  Surgeon: Josue Hector, MD;  Location: [facility];  Service: Cardiovascular;  Laterality: N/A;  . TOTAL HIP REVISION Right 09/05/2012   Procedure: RIGHT HIP RESECTION ARTHROPLASTY WITH ANTIBIOTIC SPACERS;  Surgeon: [name], MD;  Location: [facility];  Service: Orthopedics;  Laterality: Right;  . TOTAL HIP REVISION Right 11/30/2012   Procedure: RIGHT TOTAL HIP ARTHROPLASTY REIMPLANTATION;  Surgeon: [name], MD;  Location: [facility];  Service: Orthopedics;  Laterality: Right;    Family History  Problem Relation Age of Onset  . Melanoma Mother   . Heart attack Mother   . Hyperlipidemia Neg Hx   . Sudden death Neg Hx   . Hypertension Neg Hx   . Diabetes Neg Hx     No Known Allergies  Current Outpatient Medications on File Prior to Visit  Medication Sig Dispense Refill  . atorvastatin (LIPITOR) 10 MG tablet TAKE 1 TABLET BY MOUTH EVERY DAY 90 tablet 1  . hydrOXYzine (ATARAX/VISTARIL) 25 MG tablet Take 1 tablet (25 mg total) by mouth 3 (three) times daily as needed. 30 tablet 0  . losartan (COZAAR) 100 MG tablet TAKE 1 TABLET BY MOUTH EVERY DAY 90 tablet 1  . metFORMIN (GLUCOPHAGE) 500 MG tablet TAKE 1 TABLET BY MOUTH TWICE A DAY WITH MEALS 180 tablet 1  . predniSONE (DELTASONE) 10 MG tablet 5 TAB PO DAY 1 4 TAB PO DAY 2 3 TAB PO DAY 3 2 TAB PO DAY 4 1 TAB PO DAY 5 15 tablet 0  . sildenafil (VIAGRA) 100 MG tablet Take 0.5-1 tablets (50-100 mg total) by mouth daily as needed for erectile dysfunction. 10 tablet 1   No current facility-administered medications on file prior to visit.     There were no vitals taken for this visit.      Objective:    Physical Exam  General Mental Status- Alert. General Appearance- Not in acute distress.   Skin General: Color- Normal Color. Moisture- Normal Moisture.  Neck Carotid Arteries- Normal color. Moisture- Normal Moisture. No carotid bruits. No JVD. Rt side base of neck/ sternocleidomastoid area approximate 2.5 cm possible mass.   Chest and Lung Exam Auscultation: Breath Sounds:-Normal.  Cardiovascular Auscultation:Rythm- Regular. Murmurs & Other Heart Sounds:Auscultation of the heart reveals- No Murmurs.  Abdomen Inspection:-Inspeection Normal. Palpation/Percussion:Note:No mass. Palpation and Percussion of the  abdomen reveal- Non Tender, Non Distended + BS, no rebound or guarding.   Neurologic Cranial Nerve exam:- CN III-XII intact(No nystagmus), symmetric smile. Strength:- 5/5 equal and symmetric strength both upper and lower extremities.  Lower ext- see quality metrics        Assessment & Plan:  Your blood pressure is moderate well controlled today. Recommend continue to check at home. Glad to hear you numbers at home are 120/80.   For high triglycerides will get cmp and lipid panel today. Continue atrovastatin.  For diabetes will get 3 month sugar average test. Continue low sugar diet, exercise and metformin.   Please call GI office Dr. Carlean Purl to get scheduled for screening colonoscopy.(pt agreed he would call since already established. They did prior in 2019)  For neck area rt side possible mass will get [place] of neck and cbc. Will follow this description on study. May need to refer to ENT.  Follow up date to be determined after lab review.  25 + minutes spent with pt. 50% of time spent counseling pt on plan going forward.  [name], PA-C

## 2019-02-20 ENCOUNTER — Telehealth: Payer: Self-pay | Admitting: Medical

## 2019-02-20 DIAGNOSIS — R590 Localized enlarged lymph nodes: Secondary | ICD-10-CM

## 2019-02-20 NOTE — Telephone Encounter (Signed)
Referral to hem/oncologist placed upstairs. Very important referral. Will you give me update on what they say.

## 2019-02-21 ENCOUNTER — Encounter: Payer: Self-pay | Admitting: *Deleted

## 2019-02-21 DIAGNOSIS — R599 Enlarged lymph nodes, unspecified: Secondary | ICD-10-CM

## 2019-02-21 NOTE — Progress Notes (Signed)
Reached out to Steve [name] to introduce myself as the office RN Navigator and explain our new patient process. Reviewed the reason for their referral and scheduled their new patient appointment along with labs. Provided address and directions to the office including call back phone number. Reviewed with patient any concerns they may have or any possible barriers to attending their appointment.   Informed patient about my role as a navigator and that I will meet with them prior to their New Patient appointment and more fully discuss what services I can provide. Also informed patient that as referral has also been made to general surgery for a biopsy, and he may receive a call to schedule appointment. At this time patient has no further questions or needs.

## 2019-02-25 ENCOUNTER — Other Ambulatory Visit: Payer: Self-pay | Admitting: Hematology

## 2019-02-25 DIAGNOSIS — R590 Localized enlarged lymph nodes: Secondary | ICD-10-CM

## 2019-02-25 DIAGNOSIS — C8190 Hodgkin lymphoma, unspecified, unspecified site: Secondary | ICD-10-CM | POA: Insufficient documentation

## 2019-02-25 NOTE — Progress Notes (Signed)
[place] NOTE  Patient Care Team: Saguier, [name] as PCP - General (Internal Medicine)  HEME/ONC OVERVIEW: 1. Enlarged cervical LN's -02/2019: enlarged R supraclavicular LN's (~4 and 3cm) on neck US   ASSESSMENT & PLAN:    Enlarged cervical LN's -I reviewed the patient's records in detail, including PCP clinic notes, lab studies, and imaging results -In summary, patient presented to his PCP in mid-02/2019 for evaluation of some new onset right neck asymmetry without any associated pain or constitutional symptoms.  Neck ultrasound showed enlarged right supraclavicular LN's, measuring approximately 4 and 3cm.  He was referred to oncology for further evaluation. -I reviewed imaging remotely detail with patient -In light of the enlarged lymph nodes, I reviewed with the patient some of the differential diagnoses, including infection, autoimmune disease, and malignancy, including lymphoma -I will order CT neck, chest, and abdomen/pelvis to assess for any additional evidence of lymphadenopathy and masses  -In light of the relatively superficial location of the R supraclavicular LN, I have ordered US-guided core biopsy  -In addition, I have referred the patient to general surgery for evaluation of excisional lymph node biopsy, if the core biopsy is non-diagnostic  -Pending the work-up above, we will determine the next steps  Leukocytosis -WBC 10.8k today, borderline elevated -Review of CBC showed periodic mild leukocytosis dating back to 2014 -No constitutional symptoms or symptoms of infection -I personally reviewed the patient's peripheral blood smear today.  The red blood cells were of normal morphology.  There was no schistocytosis.  The white blood cells were of normal morphology. There were no peripheral circulating blasts. The platelets were of normal size and I verified that there were no platelet clumping. -We will monitor it for now   Orders Placed This  Encounter  Procedures  . [place] CORE BIOPSY (LYMPH NODES)    Standing Status:   Future    Standing Expiration Date:   04/27/2020    Order Specific Question:   Lab orders requested (DO NOT place separate lab orders, these will be automatically ordered during procedure specimen collection):    Answer:   Surgical Pathology    Order Specific Question:   Reason for Exam (SYMPTOM  OR DIAGNOSIS REQUIRED)    Answer:   Right supraclavicular LN biopsy, need multiple CORES. No FNA.    Order Specific Question:   Preferred location?    Answer:   [facility]  . CT Soft Tissue Neck W Contrast    Standing Status:   Future    Standing Expiration Date:   02/26/2020    Order Specific Question:   ** REASON FOR EXAM (FREE TEXT)    Answer:   R supraclavicular lymphadenopathy    Order Specific Question:   If indicated for the ordered procedure, I authorize the administration of contrast media per Radiology protocol    Answer:   Yes    Order Specific Question:   Preferred imaging location?    Answer:   [facility] Specific Question:   Radiology Contrast Protocol - do NOT remove file path    Answer:   \\[email or web address].[email or web address]  . CT chest w/ contrast    Standing Status:   Future    Standing Expiration Date:   02/26/2020    Order Specific Question:   ** REASON FOR EXAM (FREE TEXT)    Answer:   R supraclavicular lymphadenopathy    Order Specific Question:   If indicated for the ordered procedure, I  authorize the administration of contrast media per Radiology protocol    Answer:   Yes    Order Specific Question:   Preferred imaging location?    Answer:   [facility] Specific Question:   Radiology Contrast Protocol - do NOT remove file path    Answer:   \\[email or web address].[email or web address]  . CT AP w/ contrast    Standing Status:   Future    Standing Expiration Date:   02/26/2020    Order Specific Question:   ** REASON FOR EXAM (FREE TEXT)     Answer:   R supraclavicular lymphadenopathy    Order Specific Question:   If indicated for the ordered procedure, I authorize the administration of contrast media per Radiology protocol    Answer:   Yes    Order Specific Question:   Preferred imaging location?    Answer:   [facility] Specific Question:   Is Oral Contrast requested for this exam?    Answer:   Yes, Per Radiology protocol    Order Specific Question:   Radiology Contrast Protocol - do NOT remove file path    Answer:   \\[email or web address]   All questions were answered. The patient knows to call the clinic with any problems, questions or concerns.  Return in 2 weeks for imaging and biopsy results.   Tish Men, MD 02/26/2019 12:57 PM   CHIEF COMPLAINTS/PURPOSE OF CONSULTATION:  "My neck is still swollen"  HISTORY OF PRESENTING ILLNESS:  Steve [name] 63 y.o. male is here because of newly diagnosed R supraclavicular lymphadenopathy.  Patient presented to his PCP in mid-02/2019 for evaluation of some new onset right neck asymmetry without any associated pain or constitutional symptoms.  Neck ultrasound showed enlarged right supraclavicular LN's, measuring approximately 4 and 3cm.  He was referred to oncology for further evaluation.  Since he first noticed the right-sided neck swelling, he feels that the swelling has enlarged by about 50%.  He denies any constitutional symptoms, dysphagia, odynophagia, stridor, difficulty with breathing, or other symptoms.  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  He denies any history of tobacco, alcohol, or illicit drug use.  REVIEW OF SYSTEMS:   Constitutional: ( - ) fevers, ( - )  chills , ( - ) night sweats Eyes: ( - ) blurriness of vision, ( - ) double vision, ( - ) watery eyes Ears, nose, mouth, throat, and face: ( - ) mucositis, ( - ) sore throat Respiratory: ( - ) cough, ( - ) dyspnea, ( - ) wheezes Cardiovascular: ( - )  palpitation, ( - ) chest discomfort, ( - ) lower extremity swelling Gastrointestinal:  ( - ) nausea, ( - ) heartburn, ( - ) change in bowel habits Skin: ( - ) abnormal skin rashes Lymphatics: ( - ) new lymphadenopathy, ( - ) easy bruising Neurological: ( - ) numbness, ( - ) tingling, ( - ) new weaknesses Behavioral/Psych: ( - ) mood change, ( - ) new changes  All other systems were reviewed with the patient and are negative.  I have reviewed his chart and materials related to his cancer extensively and collaborated history with the patient. Summary of oncologic history is as follows: Oncology History   No history exists.    MEDICAL HISTORY:  Past Medical History:  Diagnosis Date  . Abscess of muscle 08/10/2011   staph infection of right hip   . GERD (gastroesophageal reflux  disease)    rare reflux - no meds for reflux - NO PROBLEM IN PAST SEVERAL YRS  . Hip dysplasia, congenital    no surgery as a child for hip dysplasia - has had bilateral hip replacements as an adult  . Pancreatitis   . Postoperative anemia due to acute blood loss 09/07/2012  . Septic arthritis of hip ([place]) 09/05/2012   PT'S TOTAL HIP JOINT REMOVED - ANTIBIOTIC SPACE PLACED AND PT HAS FINISHED IV ANTIBIOTICS ( PICC LINE REMOVED)  . Sleep apnea    USES CPAP    SURGICAL HISTORY: Past Surgical History:  Procedure Laterality Date  . COLONOSCOPY  04/26/2007  . HERNIA REPAIR     inguinal hernia x3  . JOINT REPLACEMENT  2002 & 2007   bilateral hip replacement  . MULTIPLE EXTRACTIONS WITH ALVEOLOPLASTY  07/28/2011   Procedure: MULTIPLE EXTRACION WITH ALVEOLOPLASTY;  Surgeon: Lenn Cal, DDS;  Location: [facility];  Service: Oral Surgery;  Laterality: N/A;  Extraction of tooth #'s 2,3,4,5,6,11,12,13,15,19,22 with alveoloplasty.  . shoulder repair - right for separation of shoulder    . TEE WITHOUT CARDIOVERSION  07/29/2011   Procedure: TRANSESOPHAGEAL ECHOCARDIOGRAM (TEE);  Surgeon: Josue Hector, MD;  Location: [facility];  Service: Cardiovascular;  Laterality: N/A;  . TOTAL HIP REVISION Right 09/05/2012   Procedure: RIGHT HIP RESECTION ARTHROPLASTY WITH ANTIBIOTIC SPACERS;  Surgeon: [name], MD;  Location: [facility];  Service: Orthopedics;  Laterality: Right;  . TOTAL HIP REVISION Right 11/30/2012   Procedure: RIGHT TOTAL HIP ARTHROPLASTY REIMPLANTATION;  Surgeon: [name], MD;  Location: [facility];  Service: Orthopedics;  Laterality: Right;    SOCIAL HISTORY: Social History   Socioeconomic History  . Marital status: Married    Spouse name: Not on file  . Number of children: Not on file  . Years of education: Not on file  . Highest education level: Not on file  Occupational History  . Not on file  Social Needs  . Financial resource strain: Not on file  . Food insecurity    Worry: Not on file    Inability: Not on file  . Transportation needs    Medical: Not on file    Non-medical: Not on file  Tobacco Use  . Smoking status: Never Smoker  . Smokeless tobacco: Never Used  Substance and Sexual Activity  . Alcohol use: Yes    Comment: rare beer. 5 times a year at most. 2 beers when he drinks.  . Drug use: No  . Sexual activity: Yes    Partners: Female  Lifestyle  . Physical activity    Days per week: Not on file    Minutes per session: Not on file  . Stress: Not on file  Relationships  . Social Herbalist on phone: Not on file    Gets together: Not on file    Attends religious service: Not on file    Active member of club or organization: Not on file    Attends meetings of clubs or organizations: Not on file    Relationship status: Not on file  . Intimate partner violence    Fear of current or ex partner: Not on file    Emotionally abused: Not on file    Physically abused: Not on file    Forced sexual activity: Not on file  Other Topics Concern  . 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  Welder/fabricator   2 caffeinated  beverages daily    FAMILY HISTORY: Family History  Problem Relation Age of Onset  . Melanoma Mother   . Heart attack Mother   . Hyperlipidemia Neg Hx   . Sudden death Neg Hx   . Hypertension Neg Hx   . Diabetes Neg Hx     ALLERGIES:  has No Known Allergies.  MEDICATIONS:  Current Outpatient Medications  Medication Sig Dispense Refill  . atorvastatin (LIPITOR) 10 MG tablet TAKE 1 TABLET BY MOUTH EVERY DAY 90 tablet 1  . losartan (COZAAR) 100 MG tablet TAKE 1 TABLET BY MOUTH EVERY DAY 90 tablet 1  . metFORMIN (GLUCOPHAGE) 500 MG tablet TAKE 1 TABLET BY MOUTH TWICE A DAY WITH MEALS 180 tablet 1  . sildenafil (VIAGRA) 100 MG tablet Take 0.5-1 tablets (50-100 mg total) by mouth daily as needed for erectile dysfunction. 10 tablet 1   No current facility-administered medications for this visit.     PHYSICAL EXAMINATION: ECOG PERFORMANCE STATUS: 0 - Asymptomatic  Vitals:   02/26/19 1214  BP: 134/76  Pulse: 66  Resp: 18  Temp: 97.7 F (36.5 C)  SpO2: 99%   There were no vitals filed for this visit.  GENERAL: alert, no distress and comfortable SKIN: skin color, texture, turgor are normal, no rashes or significant lesions EYES: conjunctiva are pink and non-injected, sclera clear OROPHARYNX: no exudate, no erythema; lips, buccal mucosa, and tongue normal  NECK: supple, non-tender LYMPH:  R supraclavicular fullness, no axillary lymphadenopathy  LUNGS: clear to auscultation with normal breathing effort HEART: regular rate & rhythm, no murmurs, no lower extremity edema ABDOMEN: soft, non-tender, non-distended, normal bowel sounds Musculoskeletal: no cyanosis of digits and no clubbing  PSYCH: alert & oriented x 3, fluent speech NEURO: no focal motor/sensory deficits  LABORATORY DATA:  I have reviewed the data as listed Lab Results  Component Value Date   WBC 10.8 (H) 02/26/2019   HGB 13.7 02/26/2019   HCT 42.9 02/26/2019   MCV 87.4 02/26/2019   PLT 268 02/26/2019   Lab  Results  Component Value Date   NA 139 02/26/2019   K 4.7 02/26/2019   CL 102 02/26/2019   CO2 30 02/26/2019    RADIOGRAPHIC STUDIES: I have personally reviewed the radiological images as listed and agreed with the findings in the report. US Soft Tissue Head/neck  Result Date: 02/19/2019 CLINICAL DATA:  Right neck mass EXAM: ULTRASOUND OF HEAD/NECK SOFT TISSUES TECHNIQUE: Ultrasound examination of the head and neck soft tissues was performed in the area of clinical concern. COMPARISON:  None. FINDINGS: Two enlarged lymph nodes in the right supraclavicular region. The lateral lymph node measures 3.9 x 2.0 x 5.7 cm. The more medial lymph node measures 2.9 x 2.6 x 3.1 cm. The lymph nodes show homogeneous hypoechogenic echotexture. The lymph nodes appear hypervascular on Doppler. IMPRESSION: 2 enlarged right supraclavicular lymph nodes concerning for malignancy or lymphoma. Consider further evaluation with CT neck and chest with contrast. Biopsy recommended Electronically Signed   By: Franchot Gallo M.D.   On: 02/19/2019 17:12    PATHOLOGY: I have reviewed the pathology reports as documented in the oncologist history.

## 2019-02-26 ENCOUNTER — Encounter: Payer: Self-pay | Admitting: *Deleted

## 2019-02-26 ENCOUNTER — Encounter: Payer: Self-pay | Admitting: Hematology

## 2019-02-26 ENCOUNTER — Inpatient Hospital Stay: Payer: BC Managed Care – PPO | Attending: Hematology | Admitting: Hematology

## 2019-02-26 ENCOUNTER — Inpatient Hospital Stay: Payer: BC Managed Care – PPO

## 2019-02-26 ENCOUNTER — Other Ambulatory Visit: Payer: Self-pay

## 2019-02-26 VITALS — BP 134/76 | HR 66 | Temp 97.7°F | Resp 18

## 2019-02-26 DIAGNOSIS — D72829 Elevated white blood cell count, unspecified: Secondary | ICD-10-CM | POA: Diagnosis not present

## 2019-02-26 DIAGNOSIS — K219 Gastro-esophageal reflux disease without esophagitis: Secondary | ICD-10-CM | POA: Insufficient documentation

## 2019-02-26 DIAGNOSIS — R590 Localized enlarged lymph nodes: Secondary | ICD-10-CM

## 2019-02-26 DIAGNOSIS — R221 Localized swelling, mass and lump, neck: Secondary | ICD-10-CM | POA: Insufficient documentation

## 2019-02-26 DIAGNOSIS — Z7984 Long term (current) use of oral hypoglycemic drugs: Secondary | ICD-10-CM | POA: Diagnosis not present

## 2019-02-26 DIAGNOSIS — Z79899 Other long term (current) drug therapy: Secondary | ICD-10-CM | POA: Diagnosis not present

## 2019-02-26 LAB — CBC WITH DIFFERENTIAL (CANCER CENTER ONLY)
Abs Immature Granulocytes: 0.05 10*3/uL (ref 0.00–0.07)
Basophils Absolute: 0 10*3/uL (ref 0.0–0.1)
Basophils Relative: 0 %
Eosinophils Absolute: 0.2 10*3/uL (ref 0.0–0.5)
Eosinophils Relative: 2 %
HCT: 42.9 % (ref 39.0–52.0)
Hemoglobin: 13.7 g/dL (ref 13.0–17.0)
Immature Granulocytes: 1 %
Lymphocytes Relative: 19 %
Lymphs Abs: 2 10*3/uL (ref 0.7–4.0)
MCH: 27.9 pg (ref 26.0–34.0)
MCHC: 31.9 g/dL (ref 30.0–36.0)
MCV: 87.4 fL (ref 80.0–100.0)
Monocytes Absolute: 0.9 10*3/uL (ref 0.1–1.0)
Monocytes Relative: 8 %
Neutro Abs: 7.7 10*3/uL (ref 1.7–7.7)
Neutrophils Relative %: 70 %
Platelet Count: 268 10*3/uL (ref 150–400)
RBC: 4.91 MIL/uL (ref 4.22–5.81)
RDW: 13.4 % (ref 11.5–15.5)
WBC Count: 10.8 10*3/uL — ABNORMAL HIGH (ref 4.0–10.5)
nRBC: 0 % (ref 0.0–0.2)

## 2019-02-26 LAB — CMP (CANCER CENTER ONLY)
ALT: 16 U/L (ref 0–44)
AST: 16 U/L (ref 15–41)
Albumin: 4.5 g/dL (ref 3.5–5.0)
Alkaline Phosphatase: 68 U/L (ref 38–126)
Anion gap: 7 (ref 5–15)
BUN: 19 mg/dL (ref 8–23)
CO2: 30 mmol/L (ref 22–32)
Calcium: 9.6 mg/dL (ref 8.9–10.3)
Chloride: 102 mmol/L (ref 98–111)
Creatinine: 0.91 mg/dL (ref 0.61–1.24)
GFR, Est AFR Am: >60 mL/min (ref >60)
GFR, Estimated: >60 mL/min (ref >60)
Glucose, Bld: 119 mg/dL — ABNORMAL HIGH (ref 70–99)
Potassium: 4.7 mmol/L (ref 3.5–5.1)
Sodium: 139 mmol/L (ref 135–145)
Total Bilirubin: 0.9 mg/dL (ref 0.3–1.2)
Total Protein: 7.2 g/dL (ref 6.5–8.1)

## 2019-02-26 LAB — LACTATE DEHYDROGENASE: LDH: 277 U/L — ABNORMAL HIGH (ref 98–192)

## 2019-02-26 LAB — SAVE SMEAR(SSMR), FOR PROVIDER SLIDE REVIEW

## 2019-02-26 NOTE — Progress Notes (Signed)
Initial RN Navigator Patient Visit  Name: Steve [name] Date of Referral : 02/20/19 Diagnosis: Adenopathy  Met with patient prior to their visit with MD. [name] patient "Your Patient Navigator" handout which explains my role, areas in which I am able to help, and all the contact information for myself and the office. Also gave patient MD and Navigator business card. Reviewed with patient the general overview of expected course after initial diagnosis and time frame for all steps to be completed.  New patient packet given to patient which includes: orientation to office and staff; campus directory; education on My Chart and Advance Directives  Patient completed visit with Dr.Zhao  Revisited with patient after MD visit. Patient will need  Current appointment with surgery is 03/19/2019. This appointment is too far out. Called Sarah and had appointment rescheduled for 02/28/2019 at 1:45p  CT CAP - Scheduled for 02/28/2019.  Unfortunately patient left without his oral contrast. He knows to pick up contrast before date of scan.   My Chart message sent to patient with instructions on this weeks appointment.   Patient understands all follow up procedures and expectations. They have my number to reach out for any further clarification or additional needs.

## 2019-02-28 ENCOUNTER — Other Ambulatory Visit: Payer: Self-pay

## 2019-02-28 ENCOUNTER — Ambulatory Visit (HOSPITAL_BASED_OUTPATIENT_CLINIC_OR_DEPARTMENT_OTHER)
Admission: RE | Admit: 2019-02-28 | Discharge: 2019-02-28 | Disposition: A | Discharge status: Home / Self Care | Payer: BC Managed Care – PPO | Source: Ambulatory Visit | Attending: Hematology | Admitting: Hematology

## 2019-02-28 DIAGNOSIS — R590 Localized enlarged lymph nodes: Secondary | ICD-10-CM | POA: Diagnosis present

## 2019-02-28 MED ORDER — IOHEXOL 300 MG/ML  SOLN
100.0000 mL | Freq: Once | INTRAMUSCULAR | Status: AC | PRN
  Administered 2019-02-28: 100 mL via INTRAVENOUS

## 2019-03-01 ENCOUNTER — Encounter (HOSPITAL_COMMUNITY): Payer: Self-pay

## 2019-03-01 NOTE — Progress Notes (Signed)
[name] Male, 63 y.o., [birth year] MRN:  [id number] Phone:  [phone] (M) ... PCP:  [name], PA-C Coverage:  [employer] Ppo Next Appt With Radiology ([facility]) 03/08/2019 at 1:00 PM  RE: Biospy Received: Today Message Contents  Arne Cleveland, MD  Karey Stucki E  Ok   [place] core R supraclav LAN    [facility]   Previous Messages  ----- Message -----  From: Lenore Cordia  Sent: 03/01/2019  3:18 PM EST  To: Ir Procedure Requests  Subject: Biospy                      Procedure Requested: US Biopsy    Reason for Procedure: Right supraclavicular LN biopsy, need multiple CORES. No FNA.    Provider Requesting: Tish Men  Provider Telephone: [phone]    Other Info: Rad exams in Epic

## 2019-03-06 ENCOUNTER — Other Ambulatory Visit: Payer: Self-pay | Admitting: Radiology

## 2019-03-08 ENCOUNTER — Ambulatory Visit (HOSPITAL_COMMUNITY)
Admission: RE | Admit: 2019-03-08 | Discharge: 2019-03-08 | Disposition: A | Discharge status: Home / Self Care | Payer: BC Managed Care – PPO | Source: Ambulatory Visit | Attending: Hematology | Admitting: Hematology

## 2019-03-08 ENCOUNTER — Other Ambulatory Visit: Payer: Self-pay

## 2019-03-08 ENCOUNTER — Encounter (HOSPITAL_COMMUNITY): Payer: Self-pay

## 2019-03-08 DIAGNOSIS — R591 Generalized enlarged lymph nodes: Secondary | ICD-10-CM | POA: Diagnosis present

## 2019-03-08 DIAGNOSIS — G4730 Sleep apnea, unspecified: Secondary | ICD-10-CM | POA: Diagnosis not present

## 2019-03-08 DIAGNOSIS — E119 Type 2 diabetes mellitus without complications: Secondary | ICD-10-CM | POA: Diagnosis not present

## 2019-03-08 DIAGNOSIS — Z7901 Long term (current) use of anticoagulants: Secondary | ICD-10-CM | POA: Diagnosis not present

## 2019-03-08 DIAGNOSIS — R590 Localized enlarged lymph nodes: Secondary | ICD-10-CM

## 2019-03-08 DIAGNOSIS — Z7984 Long term (current) use of oral hypoglycemic drugs: Secondary | ICD-10-CM | POA: Diagnosis not present

## 2019-03-08 HISTORY — DX: Type 2 diabetes mellitus without complications: E11.9

## 2019-03-08 LAB — PROTIME-INR
INR: 1.1 (ref 0.8–1.2)
Prothrombin Time: 13.9 seconds (ref 11.4–15.2)

## 2019-03-08 LAB — CBC WITH DIFFERENTIAL/PLATELET
Abs Immature Granulocytes: 0.03 10*3/uL (ref 0.00–0.07)
Basophils Absolute: 0.1 10*3/uL (ref 0.0–0.1)
Basophils Relative: 1 %
Eosinophils Absolute: 0.1 10*3/uL (ref 0.0–0.5)
Eosinophils Relative: 1 %
HCT: 43.2 % (ref 39.0–52.0)
Hemoglobin: 13.7 g/dL (ref 13.0–17.0)
Immature Granulocytes: 0 %
Lymphocytes Relative: 16 %
Lymphs Abs: 1.5 10*3/uL (ref 0.7–4.0)
MCH: 27.8 pg (ref 26.0–34.0)
MCHC: 31.7 g/dL (ref 30.0–36.0)
MCV: 87.8 fL (ref 80.0–100.0)
Monocytes Absolute: 1 10*3/uL (ref 0.1–1.0)
Monocytes Relative: 10 %
Neutro Abs: 7 10*3/uL (ref 1.7–7.7)
Neutrophils Relative %: 72 %
Platelets: 264 10*3/uL (ref 150–400)
RBC: 4.92 MIL/uL (ref 4.22–5.81)
RDW: 13.7 % (ref 11.5–15.5)
WBC: 9.7 10*3/uL (ref 4.0–10.5)
nRBC: 0 % (ref 0.0–0.2)

## 2019-03-08 LAB — GLUCOSE, CAPILLARY: Glucose-Capillary: 112 mg/dL — ABNORMAL HIGH (ref 70–99)

## 2019-03-08 MED ORDER — LIDOCAINE HCL (PF) 1 % IJ SOLN
INTRAMUSCULAR | Status: AC | PRN
  Administered 2019-03-08: 10 mL

## 2019-03-08 MED ORDER — FENTANYL CITRATE (PF) 100 MCG/2ML IJ SOLN
INTRAMUSCULAR | Status: AC
  Filled 2019-03-08: qty 2

## 2019-03-08 MED ORDER — LIDOCAINE HCL 1 % IJ SOLN
INTRAMUSCULAR | Status: AC
  Filled 2019-03-08: qty 10

## 2019-03-08 MED ORDER — FENTANYL CITRATE (PF) 100 MCG/2ML IJ SOLN
INTRAMUSCULAR | Status: AC | PRN
  Administered 2019-03-08 (×2): 50 ug via INTRAVENOUS

## 2019-03-08 MED ORDER — SODIUM CHLORIDE 0.9 % IV SOLN
INTRAVENOUS | Status: DC
  Administered 2019-03-08: 12:00:00 via INTRAVENOUS

## 2019-03-08 MED ORDER — MIDAZOLAM HCL 2 MG/2ML IJ SOLN
INTRAMUSCULAR | Status: AC | PRN
  Administered 2019-03-08 (×2): 1 mg via INTRAVENOUS

## 2019-03-08 MED ORDER — MIDAZOLAM HCL 2 MG/2ML IJ SOLN
INTRAMUSCULAR | Status: AC
  Filled 2019-03-08: qty 2

## 2019-03-08 NOTE — Consult Note (Signed)
Chief Complaint: Patient was seen in consultation today for image guided right supraclavicular lymph node biopsy  Referring Physician(s): Zhao,Yan  Supervising Physician: Sandi Mariscal  Patient Status: [facility] - Out-pt  History of Present Illness: Steve [name] is a 63 y.o. male non-smoker with history of painless enlarged lymph node right supraclavicular region.  He denies recent infections.  He did have a melanoma removed from the abdominal region approximately 1 year ago.  He states size of lymph node has decreased over the past several days.  Recent imaging has revealed bulky right supraclavicular and mediastinal adenopathy as well as porta hepatic and retroperitoneal adenopathy.  He presents today for image guided biopsy of the right supraclavicular lymph node for further evaluation.  Past Medical History:  Diagnosis Date   Abscess of muscle 08/10/2011   staph infection of right hip    Diabetes mellitus without complication ([name])    GERD (gastroesophageal reflux disease)    rare reflux - no meds for reflux - NO PROBLEM IN PAST SEVERAL YRS   Hip dysplasia, congenital    no surgery as a child for hip dysplasia - has had bilateral hip replacements as an adult   Pancreatitis    Postoperative anemia due to acute blood loss 09/07/2012   Septic arthritis of hip ([place]) 09/05/2012   PT'S TOTAL HIP JOINT REMOVED - ANTIBIOTIC SPACE PLACED AND PT HAS FINISHED IV ANTIBIOTICS ( PICC LINE REMOVED)   Sleep apnea    USES CPAP    Past Surgical History:  Procedure Laterality Date   COLONOSCOPY  04/26/2007   HERNIA REPAIR     inguinal hernia x3   JOINT REPLACEMENT  2002 & 2007   bilateral hip replacement   MULTIPLE EXTRACTIONS WITH ALVEOLOPLASTY  07/28/2011   Procedure: MULTIPLE EXTRACION WITH ALVEOLOPLASTY;  Surgeon: Lenn Cal, DDS;  Location: [facility];  Service: Oral Surgery;  Laterality: N/A;  Extraction of tooth #'s 2,3,4,5,6,11,12,13,15,19,22 with alveoloplasty.    shoulder repair - right for separation of shoulder     TEE WITHOUT CARDIOVERSION  07/29/2011   Procedure: TRANSESOPHAGEAL ECHOCARDIOGRAM (TEE);  Surgeon: Josue Hector, MD;  Location: [place];  Service: Cardiovascular;  Laterality: N/A;   TOTAL HIP REVISION Right 09/05/2012   Procedure: RIGHT HIP RESECTION ARTHROPLASTY WITH ANTIBIOTIC SPACERS;  Surgeon: [name], MD;  Location: [facility];  Service: Orthopedics;  Laterality: Right;   TOTAL HIP REVISION Right 11/30/2012   Procedure: RIGHT TOTAL HIP ARTHROPLASTY REIMPLANTATION;  Surgeon: [name], MD;  Location: [facility];  Service: Orthopedics;  Laterality: Right;    Allergies: Patient has no known allergies.  Medications: Prior to Admission medications   Medication Sig Start Date End Date Taking? Authorizing Provider  atorvastatin (LIPITOR) 10 MG tablet TAKE 1 TABLET BY MOUTH EVERY DAY 10/18/18  Yes Saguier, Percell Miller, PA-C  losartan (COZAAR) 100 MG tablet TAKE 1 TABLET BY MOUTH EVERY DAY 02/12/19  Yes Saguier, Percell Miller, PA-C  metFORMIN (GLUCOPHAGE) 500 MG tablet TAKE 1 TABLET BY MOUTH TWICE A DAY WITH MEALS 02/12/19  Yes Saguier, Percell Miller, PA-C  sildenafil (VIAGRA) 100 MG tablet Take 0.5-1 tablets (50-100 mg total) by mouth daily as needed for erectile dysfunction. 06/13/17   Saguier, Percell Miller, PA-C     Family History  Problem Relation Age of Onset   Melanoma Mother    Heart attack Mother    Hyperlipidemia Neg Hx    Sudden death Neg Hx    Hypertension Neg Hx    Diabetes Neg Hx  Social History   Socioeconomic History   Marital status: Married    Spouse name: Not on file   Number of children: Not on file   Years of education: Not on file   Highest education level: Not on file  Occupational History   Not on file  Social Needs   Financial resource strain: Not on file   Food insecurity    Worry: Not on file    Inability: Not on file   Transportation needs    Medical: Not on file    Non-medical: Not on file    Tobacco Use   Smoking status: Never Smoker   Smokeless tobacco: Never Used  Substance and Sexual Activity   Alcohol use: Yes    Comment: rare beer. 5 times a year at most. 2 beers when he drinks.   Drug use: No   Sexual activity: Yes    Partners: Female  Lifestyle   Physical activity    Days per week: Not on file    Minutes per session: Not on file   Stress: Not on file  Relationships   Social connections    Talks on phone: Not on file    Gets together: Not on file    Attends religious service: Not on file    Active member of club or organization: Not on file    Attends meetings of clubs or organizations: Not on file    Relationship status: Not on file  Other Topics Concern   Not on file  Social History Narrative   Married, 1 son and 3 sons.   Welder/fabricator   2 caffeinated beverages daily      Review of Systems currently denies fever, headache, chest pain, dyspnea, cough, abdominal/back pain, nausea, vomiting or bleeding.  Vital Signs: BP (!) 145/82 (BP Location: Right Arm)    Pulse 64    Temp 98.7 F (37.1 C) (Oral)    Resp 16    SpO2 98%   Physical Exam awake, alert.  Chest clear to auscultation bilaterally.  Heart with regular rate and rhythm.  Abdomen soft, positive bowel sounds, nontender.  No significant lower extremity edema.  Right supraclavicular adenopathy noted, nontender to palpation  Imaging: Ct Soft Tissue Neck W Contrast  Result Date: 02/28/2019 CLINICAL DATA:  Enlarged right supraclavicular lymph nodes EXAM: CT NECK WITH CONTRAST TECHNIQUE: Multidetector CT imaging of the neck was performed using the standard protocol following the bolus administration of intravenous contrast. CONTRAST:  152mL OMNIPAQUE IOHEXOL 300 MG/ML  SOLN COMPARISON:  CT chest 02/28/2019 FINDINGS: Pharynx and larynx: Normal. No mass or swelling. Salivary glands: No inflammation, mass, or stone. Thyroid: Normal thyroid size.  Negative for nodule Lymph nodes: Right supra  supraclavicular lymph node mass. Adjacent lymph nodes measure 26 x 52 mm, and 30 x 22 mm. Left supraclavicular region is normal. No other lymphadenopathy in the neck. Vascular: Normal vascular enhancement. Limited intracranial: Negative Visualized orbits: Negative Mastoids and visualized paranasal sinuses: Negative Skeleton: Cervical spondylosis.  No acute skeletal abnormality. Upper chest: Chest CT findings reported separately. Large right paratracheal lymph node 6 cm in diameter. Other: None IMPRESSION: Right paratracheal enlarged lymph node and right supraclavicular enlarged lymph nodes compatible with neoplasm. No pharyngeal mass in the neck. No other adenopathy in the neck. Tissue sampling is recommended. Electronically Signed   By: Franchot Gallo M.D.   On: 02/28/2019 15:56   Ct Chest W/ Contrast  Result Date: 02/28/2019 CLINICAL DATA:  Patient with new diagnosis of right neck  skin cancer. Enlarged cervical adenopathy and supraclavicular adenopathy. EXAM: CT CHEST, ABDOMEN, AND PELVIS WITH CONTRAST TECHNIQUE: Multidetector CT imaging of the chest, abdomen and pelvis was performed following the standard protocol during bolus administration of intravenous contrast. CONTRAST:  133mL OMNIPAQUE IOHEXOL 300 MG/ML  SOLN COMPARISON:  CT abdomen pelvis 01/04/2013 FINDINGS: CT CHEST FINDINGS Cardiovascular: Normal heart size. No pericardial effusion. Aorta and main pulmonary artery normal in caliber. Thoracic aortic vascular calcifications. Mediastinum/Nodes: Enlarged right supraclavicular lymph nodes measuring up to 2.8 cm in thickness (image 5; series 3). There is a large right paratracheal lymph node measuring 4.8 x 5.9 cm (image 17; series 3). This exerts mass effect on the adjacent trachea. There is a 1.5 cm prevascular node (image 17; series 3). Small hiatal hernia. Normal appearance of the esophagus. Lungs/Pleura: Central airways are patent. Bilateral subpleural reticular opacities. Associated  bronchiectasis. No discrete pulmonary nodules are identified. No pleural effusion or pneumothorax. Musculoskeletal: Thoracic spine degenerative changes. No aggressive or acute appearing osseous lesions. CT ABDOMEN PELVIS FINDINGS Hepatobiliary: Stable subcentimeter too small to characterize low-attenuation lesion caudate lobe (image 55; series 3). Gallbladder is unremarkable. No intrahepatic or extrahepatic biliary ductal dilatation. Pancreas: Unremarkable Spleen: Unremarkable Adrenals/Urinary Tract: Normal adrenal glands. Kidneys enhance symmetrically with contrast. No hydronephrosis. Urinary bladder poorly visualized due to streak artifact. Stomach/Bowel: Sigmoid colonic diverticulosis. No CT evidence for acute diverticulitis. No evidence for small bowel obstruction. No free fluid or free intraperitoneal air. Normal morphology of the stomach. Vascular/Lymphatic: Normal caliber abdominal aorta. 1.4 cm precaval node (image 64; series 3). 1.1 cm retrocaval node (image 73; series 3). 1.4 cm aortocaval node (image 80; series 3). 1.2 cm left periaortic node (image 81; series 3). Reproductive: Poorly visualized. Other: Small fat containing left inguinal hernia. Musculoskeletal: Bilateral hip arthroplasties. Lower thoracic and lumbar spine degenerative changes. No aggressive or acute appearing osseous lesions. IMPRESSION: 1. Bulky right supraclavicular and mediastinal adenopathy most compatible with metastatic disease. Additionally there is porta hepatic and retroperitoneal adenopathy within the abdomen concerning for metastatic disease. 2. Mild fibrotic changes involving the lungs bilaterally. 3. See dedicated neck CT report. Electronically Signed   By: Lovey Newcomer M.D.   On: 02/28/2019 14:05   US Soft Tissue Head/neck  Result Date: 02/19/2019 CLINICAL DATA:  Right neck mass EXAM: ULTRASOUND OF HEAD/NECK SOFT TISSUES TECHNIQUE: Ultrasound examination of the head and neck soft tissues was performed in the area of  clinical concern. COMPARISON:  None. FINDINGS: Two enlarged lymph nodes in the right supraclavicular region. The lateral lymph node measures 3.9 x 2.0 x 5.7 cm. The more medial lymph node measures 2.9 x 2.6 x 3.1 cm. The lymph nodes show homogeneous hypoechogenic echotexture. The lymph nodes appear hypervascular on Doppler. IMPRESSION: 2 enlarged right supraclavicular lymph nodes concerning for malignancy or lymphoma. Consider further evaluation with CT neck and chest with contrast. Biopsy recommended Electronically Signed   By: Franchot Gallo M.D.   On: 02/19/2019 17:12   Ct Ap W/ Contrast  Result Date: 02/28/2019 CLINICAL DATA:  Patient with new diagnosis of right neck skin cancer. Enlarged cervical adenopathy and supraclavicular adenopathy. EXAM: CT CHEST, ABDOMEN, AND PELVIS WITH CONTRAST TECHNIQUE: Multidetector CT imaging of the chest, abdomen and pelvis was performed following the standard protocol during bolus administration of intravenous contrast. CONTRAST:  124mL OMNIPAQUE IOHEXOL 300 MG/ML  SOLN COMPARISON:  CT abdomen pelvis 01/04/2013 FINDINGS: CT CHEST FINDINGS Cardiovascular: Normal heart size. No pericardial effusion. Aorta and main pulmonary artery normal in caliber. Thoracic aortic vascular calcifications.  Mediastinum/Nodes: Enlarged right supraclavicular lymph nodes measuring up to 2.8 cm in thickness (image 5; series 3). There is a large right paratracheal lymph node measuring 4.8 x 5.9 cm (image 17; series 3). This exerts mass effect on the adjacent trachea. There is a 1.5 cm prevascular node (image 17; series 3). Small hiatal hernia. Normal appearance of the esophagus. Lungs/Pleura: Central airways are patent. Bilateral subpleural reticular opacities. Associated bronchiectasis. No discrete pulmonary nodules are identified. No pleural effusion or pneumothorax. Musculoskeletal: Thoracic spine degenerative changes. No aggressive or acute appearing osseous lesions. CT ABDOMEN PELVIS FINDINGS  Hepatobiliary: Stable subcentimeter too small to characterize low-attenuation lesion caudate lobe (image 55; series 3). Gallbladder is unremarkable. No intrahepatic or extrahepatic biliary ductal dilatation. Pancreas: Unremarkable Spleen: Unremarkable Adrenals/Urinary Tract: Normal adrenal glands. Kidneys enhance symmetrically with contrast. No hydronephrosis. Urinary bladder poorly visualized due to streak artifact. Stomach/Bowel: Sigmoid colonic diverticulosis. No CT evidence for acute diverticulitis. No evidence for small bowel obstruction. No free fluid or free intraperitoneal air. Normal morphology of the stomach. Vascular/Lymphatic: Normal caliber abdominal aorta. 1.4 cm precaval node (image 64; series 3). 1.1 cm retrocaval node (image 73; series 3). 1.4 cm aortocaval node (image 80; series 3). 1.2 cm left periaortic node (image 81; series 3). Reproductive: Poorly visualized. Other: Small fat containing left inguinal hernia. Musculoskeletal: Bilateral hip arthroplasties. Lower thoracic and lumbar spine degenerative changes. No aggressive or acute appearing osseous lesions. IMPRESSION: 1. Bulky right supraclavicular and mediastinal adenopathy most compatible with metastatic disease. Additionally there is porta hepatic and retroperitoneal adenopathy within the abdomen concerning for metastatic disease. 2. Mild fibrotic changes involving the lungs bilaterally. 3. See dedicated neck CT report. Electronically Signed   By: Lovey Newcomer M.D.   On: 02/28/2019 14:05    Labs:  CBC: Recent Labs    02/19/19 0916 02/26/19 1151  WBC 9.6 10.8*  HGB 13.0 13.7  HCT 39.0 42.9  PLT 288.0 268    COAGS: No results for input(s): INR, APTT in the last 8760 hours.  BMP: Recent Labs    02/19/19 0916 02/26/19 1151  NA 137 139  K 4.3 4.7  CL 102 102  CO2 30 30  GLUCOSE 135* 119*  BUN 13 19  CALCIUM 9.1 9.6  CREATININE 0.81 0.91  GFRNONAA  --  >60  GFRAA  --  >60    LIVER FUNCTION TESTS: Recent Labs     02/19/19 0916 02/26/19 1151  BILITOT 0.7 0.9  AST 14 16  ALT 13 16  ALKPHOS 69 68  PROT 6.9 7.2  ALBUMIN 4.1 4.5    TUMOR MARKERS: No results for input(s): AFPTM, CEA, CA199, CHROMGRNA in the last 8760 hours.  Assessment and Plan: 63 y.o. male non-smoker with history of painless enlarged lymph node right supraclavicular region.  He denies recent infections.  He did have a melanoma removed from the abdominal region approximately 1 year ago.  He states size of lymph node has decreased over the past several days.  Recent imaging has revealed bulky right supraclavicular and mediastinal adenopathy as well as porta hepatic and retroperitoneal adenopathy.  He presents today for image guided biopsy of the right supraclavicular lymph node for further evaluation.Risks and benefits of procedure was discussed with the patient  including, but not limited to bleeding, infection, damage to adjacent structures or low yield requiring additional tests.  All of the questions were answered and there is agreement to proceed.  Consent signed and in chart.     Thank you for this  interesting consult.  I greatly enjoyed meeting AMEN STASZAK and look forward to participating in their care.  A copy of this report was sent to the requesting provider on this date.  Electronically Signed: D. Rowe Robert, PA-C 03/08/2019, 12:00 PM  I spent a total of  20 minutes   in face to face in clinical consultation, greater than 50% of which was counseling/coordinating care for image guided right supraclavicular lymph node biopsy

## 2019-03-08 NOTE — Discharge Instructions (Addendum)
Needle Biopsy, Care After °These instructions tell you how to care for yourself after your procedure. Your doctor may also give you more specific instructions. Call your doctor if you have any problems or questions. °What can I expect after the procedure? °After the procedure, it is common to have: °· Soreness. °· Bruising. °· Mild pain. °Follow these instructions at home: ° °· Return to your normal activities as told by your doctor. Ask your doctor what activities are safe for you. °· Take over-the-counter and prescription medicines only as told by your doctor. °· Wash your hands with soap and water before you change your bandage (dressing). If you cannot use soap and water, use hand sanitizer. °· Follow instructions from your doctor about: °? How to take care of your puncture site. °? When and how to change your bandage. °? When to remove your bandage. °· Check your puncture site every day for signs of infection. Watch for: °? Redness, swelling, or pain. °? Fluid or blood.  °? Pus or a bad smell. °? Warmth. °· Do not take baths, swim, or use a hot tub until your doctor approves. Ask your doctor if you may take showers. You may only be allowed to take sponge baths. °· Keep all follow-up visits as told by your doctor. This is important. °Contact a doctor if you have: °· A fever. °· Redness, swelling, or pain at the puncture site, and it lasts longer than a few days. °· Fluid, blood, or pus coming from the puncture site. °· Warmth coming from the puncture site. °Get help right away if: °· You have a lot of bleeding from the puncture site. °Summary °· After the procedure, it is common to have soreness, bruising, or mild pain at the puncture site. °· Check your puncture site every day for signs of infection, such as redness, swelling, or pain. °· Get help right away if you have severe bleeding from your puncture site. °This information is not intended to replace advice given to you by your health care provider. Make  sure you discuss any questions you have with your health care provider. °Document Released: 03/10/2008 Document Revised: 04/10/2017 Document Reviewed: 04/10/2017 °Elsevier Patient Education © 2020 [employer] °Moderate Conscious Sedation, Adult, Care After °These instructions provide you with information about caring for yourself after your procedure. Your health care provider may also give you more specific instructions. Your treatment has been planned according to current medical practices, but problems sometimes occur. Call your health care provider if you have any problems or questions after your procedure. °What can I expect after the procedure? °After your procedure, it is common: °· To feel sleepy for several hours. °· To feel clumsy and have poor balance for several hours. °· To have poor judgment for several hours. °· To vomit if you eat too soon. °Follow these instructions at home: °For at least 24 hours after the procedure: ° °· Do not: °? Participate in activities where you could fall or become injured. °? Drive. °? Use heavy machinery. °? Drink alcohol. °? Take sleeping pills or medicines that cause drowsiness. °? Make important decisions or sign legal documents. °? Take care of children on your own. °· Rest. °Eating and drinking °· Follow the diet recommended by your health care provider. °· If you vomit: °? Drink water, juice, or soup when you can drink without vomiting. °? Make sure you have little or no nausea before eating solid foods. °General instructions °· Have a responsible adult stay with   you until you are awake and alert. °· Take over-the-counter and prescription medicines only as told by your health care provider. °· If you smoke, do not smoke without supervision. °· Keep all follow-up visits as told by your health care provider. This is important. °Contact a health care provider if: °· You keep feeling nauseous or you keep vomiting. °· You feel light-headed. °· You develop a rash. °· You  have a fever. °Get help right away if: °· You have trouble breathing. °This information is not intended to replace advice given to you by your health care provider. Make sure you discuss any questions you have with your health care provider. °Document Released: 01/16/2013 Document Revised: 03/10/2017 Document Reviewed: 07/18/2015 °Elsevier Patient Education © 2020 [employer] ° °

## 2019-03-08 NOTE — Procedures (Signed)
Pre Procedure Dx: Right supraclavicular lymphadenopathy Post Procedural Dx: Same  Technically successful US guided biopsy of indeterminate right supraclavicular lymph node  EBL: None No immediate complications.   Jay Kaityln Kallstrom, MD Pager #: [phone]    

## 2019-03-12 ENCOUNTER — Inpatient Hospital Stay: Payer: BC Managed Care – PPO | Admitting: Hematology

## 2019-03-12 LAB — SURGICAL PATHOLOGY

## 2019-03-13 ENCOUNTER — Encounter: Payer: Self-pay | Admitting: *Deleted

## 2019-03-13 NOTE — Progress Notes (Signed)
Patient had his core biopsy with IR but the tissue was insufficient for diagnosis. Patient will need an excisional biopsy. Patient was seen for potential excisional biopsy with Dr Ninfa Linden within the last ten days. Called  and spoke with Elmo Putt at [facility] and requested that Dr Ninfa Linden move forward with an excisional biopsy ASAP. She took the message and stated she would give to Dr Ninfa Linden.

## 2019-03-18 ENCOUNTER — Other Ambulatory Visit (HOSPITAL_COMMUNITY)
Admission: RE | Admit: 2019-03-18 | Discharge: 2019-03-18 | Disposition: A | Discharge status: Home / Self Care | Payer: BC Managed Care – PPO | Source: Ambulatory Visit | Attending: General Surgery | Admitting: General Surgery

## 2019-03-18 ENCOUNTER — Other Ambulatory Visit: Payer: Self-pay | Admitting: General Surgery

## 2019-03-18 ENCOUNTER — Encounter (HOSPITAL_BASED_OUTPATIENT_CLINIC_OR_DEPARTMENT_OTHER): Payer: Self-pay | Admitting: *Deleted

## 2019-03-18 ENCOUNTER — Inpatient Hospital Stay: Payer: BC Managed Care – PPO

## 2019-03-18 ENCOUNTER — Other Ambulatory Visit: Payer: Self-pay | Admitting: Hematology

## 2019-03-18 ENCOUNTER — Inpatient Hospital Stay: Payer: BC Managed Care – PPO | Admitting: Hematology

## 2019-03-18 DIAGNOSIS — Z20828 Contact with and (suspected) exposure to other viral communicable diseases: Secondary | ICD-10-CM | POA: Diagnosis not present

## 2019-03-18 DIAGNOSIS — Z01812 Encounter for preprocedural laboratory examination: Secondary | ICD-10-CM | POA: Insufficient documentation

## 2019-03-18 DIAGNOSIS — R590 Localized enlarged lymph nodes: Secondary | ICD-10-CM

## 2019-03-18 LAB — SARS CORONAVIRUS 2 (TAT 6-24 HRS): SARS Coronavirus 2: NEGATIVE

## 2019-03-18 NOTE — H&P (Signed)
Steve [name] Location: [facility] Patient #: [id number] DOB: April 26, 1955 Married / Language: English / Race: White Male     History of Present Illness  The patient is a 63 year old male who presents with a complaint of enlarge lymph nodes.  CHIEF COMPLAINT: enlarged supraclavicular lymph nodes  Patient is referred by Dr. Maylon Peppers at the cancer center for evaluation of right supraclavicular enlarged lymph nodes. Patient had presented to his primary care provider with complaints of asymmetry in the right neck. On palpation he was felt to have lymphadenopathy. He underwent an ultrasound on February 19, 2019. This showed 2 enlarged lymph nodes in the right supraclavicular region measuring 3.9 cm and 2.9 cm respectively. Suspicion was raised over malignancy or lymphoma. Patient was referred to the cancer center and following evaluation he has undergone CT scans of the neck chest and abdomen. The results are detailed below.     A core needle biopsy of the lymph nodes in the right supraclavicular region has been Done and is nondiagnostic. His oncologist has requested excision of deep right supraclavicular lymph node to establish diagnosis..  That is appropriate.   Patient has not had any other symptoms related to the supraclavicular mass. He denies any pain. He denies any night sweats. He denies any changes in weight. His had no fevers.  He'll be scheduled for excision deep right supraclavicular lymph nodes under general anesthesia as an urgent case.  I discussed the indications, details, techniques, and numerous risk of the surgery with him.  He is aware of the risk of bleeding, infection, nerve damage with chronic pain or numbness,lymphocele, injury to adjacent structures and other unforeseen complications. Injury to the trachea, esophagus, or recurrent laryngeal nerve with local cord dysfunction is mentioned but felt to be much less likely.He understands all these issues.  All  his questions are answered.  He agrees with this plan.    Past Surgical History  Shoulder Surgery  Right.  Allergies  No Known Drug Allergies  Allergies Reconciled   Medication History  Atorvastatin Calcium (10MG  Tablet, Oral) Active. Losartan Potassium (100MG  Tablet, Oral) Active. metFORMIN HCl (500MG  Tablet, Oral) Active. Medications Reconciled  Social History  Tobacco use  Never smoker.  Family History  Breast Cancer  Mother. Respiratory Condition  Father.  Other Problems Diabetes Mellitus  High blood pressure  Inguinal Hernia  Melanoma     Review of Systems General Not Present- Appetite Loss, Chills, Fatigue, Fever, Night Sweats, Weight Gain and Weight Loss. Skin Present- Dryness. Not Present- Change in Wart/Mole, Hives, Jaundice, New Lesions, Non-Healing Wounds, Rash and Ulcer. HEENT Not Present- Earache, Hearing Loss, Hoarseness, Nose Bleed, Oral Ulcers, Ringing in the Ears, Seasonal Allergies, Sinus Pain, Sore Throat, Visual Disturbances, Wears glasses/contact lenses and Yellow Eyes. Breast Not Present- Breast Mass, Breast Pain, Nipple Discharge and Skin Changes. Gastrointestinal Not Present- Abdominal Pain, Bloating, Bloody Stool, Change in Bowel Habits, Chronic diarrhea, Constipation, Difficulty Swallowing, Excessive gas, Gets full quickly at meals, Hemorrhoids, Indigestion, Nausea, Rectal Pain and Vomiting. Male Genitourinary Not Present- Blood in Urine, Change in Urinary Stream, Frequency, Impotence, Nocturia, Painful Urination, Urgency and Urine Leakage. Musculoskeletal Present- Back Pain. Not Present- Joint Pain, Joint Stiffness, Muscle Pain, Muscle Weakness and Swelling of Extremities. Psychiatric Not Present- Anxiety, Bipolar, Change in Sleep Pattern, Depression, Fearful and Frequent crying. Endocrine Not Present- Cold Intolerance, Excessive Hunger, Hair Changes, Heat Intolerance, Hot flashes and New Diabetes.  Vitals Weight: 194.4 lb Height:  66in Body Surface Area: 1.98 m  Body Mass Index: 31.38 kg/m  Temp.: 97.31F  Pulse: 96 (Regular)  BP: 135/85 (Sitting, Left Arm, Standard)       Physical Exam  The physical exam findings are as follows: Note:GENERAL APPEARANCE Development: normal Nutritional status: normal Gross deformities: none  SKIN Rash, lesions, ulcers: none Induration, erythema: none Nodules: none palpable  EYES Conjunctiva and lids: normal Pupils: equal and reactive Iris: normal bilaterally  EARS, NOSE, MOUTH, THROAT External ears: no lesion or deformity External nose: no lesion or deformity Hearing: grossly normal Patient is wearing a mask.  NECK Symmetric: yes Trachea: midline Thyroid: no palpable nodules in the thyroid bed In the right anterior supraclavicular fossa is a palpable complex mass likely representing the previously noted lymphadenopathy. It is firm. It is fixed. It is nontender. There is some overlying venous engorgement.  CHEST Respiratory effort: normal Retraction or accessory muscle use: no Breath sounds: normal bilaterally Rales, rhonchi, wheeze: none  CARDIOVASCULAR Auscultation: regular rhythm, normal rate Murmurs: none Pulses: carotid and radial pulse 2+ palpable Lower extremity edema: none Lower extremity varicosities: none  MUSCULOSKELETAL Station and gait: normal Digits and nails: no clubbing or cyanosis Muscle strength: grossly normal all extremities Range of motion: grossly normal all extremities Deformity: none  LYMPHATIC Cervical: none palpable Supraclavicular: none palpable  PSYCHIATRIC Oriented to person, place, and time: yes Mood and affect: normal for situation Judgment and insight: appropriate for situation    Assessment & Plan  SUPRACLAVICULAR LYMPHADENOPATHY (R59.0)   Patient is referred by medical oncology for evaluation of supraclavicular lymphadenopathy.  Patient underwent CT scan of the neck chest abdomen and pelvis,  and I have reviewed these. There is right supraclavicular adenopathy, right paratracheal adenopathy,and abdominally portal hepatic and retroperitoneal Adenopathy.  Differential diagnosis includes lymphoproliferative disorder most likely, less likely metastatic carcinoma of unknown primary.  On clinical examination, there is a right supraclavicular mass which likely represents the enlarged lymph nodes. There is no other palpable lymphadenopathy in the cervical region or axillae.  Image guided core biopsy of the right supraclavicular lymph nodes is nondiagnostic.  His oncologist has requested excision of deep cervical lymph node to establish diagnosis.  We also discussed the possible need for placement of an infusion port if chemotherapy is indicated. We can perform that as an outpatient surgery at some point in the future if necessary.  Preoperative discussed indications, details, techniques, and risks of lymph node biopsy.  We have discussed the risks of bleeding, infection, nerve damage, vascular damage, seroma or lymphocele formation.  Injury to deeper structures such as the esophagus or trachea or remotely possible but felt to be unlikely. He agrees with this plan.    Edsel Petrin. Dalbert Batman, M.D., [facility] General and Minimally invasive Surgery Breast and Colorectal Surgery Office:   [phone] Pager:   [phone]

## 2019-03-19 ENCOUNTER — Telehealth: Payer: Self-pay | Admitting: Hematology

## 2019-03-19 NOTE — Telephone Encounter (Signed)
Called and spoke with patient wife regarding appointments added per 12/7 secure chat

## 2019-03-20 ENCOUNTER — Other Ambulatory Visit: Payer: Self-pay | Admitting: General Surgery

## 2019-03-20 ENCOUNTER — Other Ambulatory Visit: Payer: Self-pay

## 2019-03-20 ENCOUNTER — Ambulatory Visit (HOSPITAL_BASED_OUTPATIENT_CLINIC_OR_DEPARTMENT_OTHER): Payer: BC Managed Care – PPO | Admitting: Certified Registered"

## 2019-03-20 ENCOUNTER — Encounter (HOSPITAL_BASED_OUTPATIENT_CLINIC_OR_DEPARTMENT_OTHER)
Admission: RE | Disposition: A | Discharge status: Home / Self Care | Payer: Self-pay | Source: Home / Self Care | Attending: General Surgery

## 2019-03-20 ENCOUNTER — Encounter (HOSPITAL_BASED_OUTPATIENT_CLINIC_OR_DEPARTMENT_OTHER): Payer: Self-pay

## 2019-03-20 ENCOUNTER — Ambulatory Visit (HOSPITAL_BASED_OUTPATIENT_CLINIC_OR_DEPARTMENT_OTHER)
Admission: RE | Admit: 2019-03-20 | Discharge: 2019-03-20 | Disposition: A | Discharge status: Home / Self Care | Payer: BC Managed Care – PPO | Attending: General Surgery | Admitting: General Surgery

## 2019-03-20 DIAGNOSIS — E119 Type 2 diabetes mellitus without complications: Secondary | ICD-10-CM | POA: Insufficient documentation

## 2019-03-20 DIAGNOSIS — K219 Gastro-esophageal reflux disease without esophagitis: Secondary | ICD-10-CM | POA: Insufficient documentation

## 2019-03-20 DIAGNOSIS — Z836 Family history of other diseases of the respiratory system: Secondary | ICD-10-CM | POA: Insufficient documentation

## 2019-03-20 DIAGNOSIS — C8191 Hodgkin lymphoma, unspecified, lymph nodes of head, face, and neck: Secondary | ICD-10-CM | POA: Insufficient documentation

## 2019-03-20 DIAGNOSIS — Z803 Family history of malignant neoplasm of breast: Secondary | ICD-10-CM | POA: Diagnosis not present

## 2019-03-20 DIAGNOSIS — Z7984 Long term (current) use of oral hypoglycemic drugs: Secondary | ICD-10-CM | POA: Diagnosis not present

## 2019-03-20 DIAGNOSIS — I10 Essential (primary) hypertension: Secondary | ICD-10-CM | POA: Insufficient documentation

## 2019-03-20 DIAGNOSIS — G4730 Sleep apnea, unspecified: Secondary | ICD-10-CM | POA: Insufficient documentation

## 2019-03-20 DIAGNOSIS — R590 Localized enlarged lymph nodes: Secondary | ICD-10-CM | POA: Diagnosis present

## 2019-03-20 DIAGNOSIS — C8190 Hodgkin lymphoma, unspecified, unspecified site: Secondary | ICD-10-CM | POA: Diagnosis present

## 2019-03-20 DIAGNOSIS — Z79899 Other long term (current) drug therapy: Secondary | ICD-10-CM | POA: Insufficient documentation

## 2019-03-20 DIAGNOSIS — Z85820 Personal history of malignant melanoma of skin: Secondary | ICD-10-CM | POA: Insufficient documentation

## 2019-03-20 HISTORY — DX: Essential (primary) hypertension: I10

## 2019-03-20 HISTORY — PX: LYMPH NODE BIOPSY: SHX201

## 2019-03-20 LAB — GLUCOSE, CAPILLARY
Glucose-Capillary: 103 mg/dL — ABNORMAL HIGH (ref 70–99)
Glucose-Capillary: 116 mg/dL — ABNORMAL HIGH (ref 70–99)
Glucose-Capillary: 123 mg/dL — ABNORMAL HIGH (ref 70–99)

## 2019-03-20 SURGERY — LYMPH NODE BIOPSY
Anesthesia: General | Site: Neck | Laterality: Right

## 2019-03-20 MED ORDER — ROCURONIUM BROMIDE 10 MG/ML (PF) SYRINGE
PREFILLED_SYRINGE | INTRAVENOUS | Status: AC
  Filled 2019-03-20: qty 10

## 2019-03-20 MED ORDER — BUPIVACAINE HCL (PF) 0.5 % IJ SOLN
INTRAMUSCULAR | Status: AC
  Filled 2019-03-20: qty 30

## 2019-03-20 MED ORDER — CEFAZOLIN SODIUM-DEXTROSE 2-4 GM/100ML-% IV SOLN
INTRAVENOUS | Status: AC
  Filled 2019-03-20: qty 100

## 2019-03-20 MED ORDER — DEXAMETHASONE SODIUM PHOSPHATE 10 MG/ML IJ SOLN
INTRAMUSCULAR | Status: AC
  Filled 2019-03-20: qty 2

## 2019-03-20 MED ORDER — ROCURONIUM BROMIDE 100 MG/10ML IV SOLN
INTRAVENOUS | Status: DC | PRN
  Administered 2019-03-20: 60 mg via INTRAVENOUS

## 2019-03-20 MED ORDER — CEFAZOLIN SODIUM-DEXTROSE 2-4 GM/100ML-% IV SOLN: 2.0000 g | INTRAVENOUS | Status: DC

## 2019-03-20 MED ORDER — LIDOCAINE 2% (20 MG/ML) 5 ML SYRINGE
INTRAMUSCULAR | Status: DC | PRN
  Administered 2019-03-20: 100 mg via INTRAVENOUS

## 2019-03-20 MED ORDER — ONDANSETRON HCL 4 MG/2ML IJ SOLN: 4.0000 mg | Freq: Once | INTRAMUSCULAR | Status: DC | PRN

## 2019-03-20 MED ORDER — SUGAMMADEX SODIUM 200 MG/2ML IV SOLN
INTRAVENOUS | Status: DC | PRN
  Administered 2019-03-20: 200 mg via INTRAVENOUS

## 2019-03-20 MED ORDER — CHLORHEXIDINE GLUCONATE CLOTH 2 % EX PADS: 6.0000 | MEDICATED_PAD | Freq: Once | CUTANEOUS | Status: DC

## 2019-03-20 MED ORDER — VANCOMYCIN HCL 10 G IV SOLR: 1500.0000 mg | Freq: Once | INTRAVENOUS | Status: DC

## 2019-03-20 MED ORDER — OXYCODONE HCL 5 MG/5ML PO SOLN: 5.0000 mg | Freq: Once | ORAL | Status: DC | PRN

## 2019-03-20 MED ORDER — OXYCODONE HCL 5 MG PO TABS: 5.0000 mg | ORAL_TABLET | Freq: Once | ORAL | Status: DC | PRN

## 2019-03-20 MED ORDER — MIDAZOLAM HCL 5 MG/5ML IJ SOLN
INTRAMUSCULAR | Status: DC | PRN
  Administered 2019-03-20: 2 mg via INTRAVENOUS

## 2019-03-20 MED ORDER — LIDOCAINE 2% (20 MG/ML) 5 ML SYRINGE
INTRAMUSCULAR | Status: AC
  Filled 2019-03-20: qty 5

## 2019-03-20 MED ORDER — ACETAMINOPHEN 500 MG PO TABS
ORAL_TABLET | ORAL | Status: AC
  Filled 2019-03-20: qty 2

## 2019-03-20 MED ORDER — EPHEDRINE 5 MG/ML INJ
INTRAVENOUS | Status: AC
  Filled 2019-03-20: qty 10

## 2019-03-20 MED ORDER — CELECOXIB 200 MG PO CAPS
200.0000 mg | ORAL_CAPSULE | ORAL | Status: AC
  Administered 2019-03-20: 200 mg via ORAL

## 2019-03-20 MED ORDER — FENTANYL CITRATE (PF) 100 MCG/2ML IJ SOLN: 25.0000 ug | INTRAMUSCULAR | Status: DC | PRN

## 2019-03-20 MED ORDER — PHENYLEPHRINE 40 MCG/ML (10ML) SYRINGE FOR IV PUSH (FOR BLOOD PRESSURE SUPPORT)
PREFILLED_SYRINGE | INTRAVENOUS | Status: AC
  Filled 2019-03-20: qty 20

## 2019-03-20 MED ORDER — SODIUM CHLORIDE 0.9% FLUSH: 3.0000 mL | Freq: Two times a day (BID) | INTRAVENOUS | Status: DC

## 2019-03-20 MED ORDER — PROPOFOL 10 MG/ML IV BOLUS
INTRAVENOUS | Status: DC | PRN
  Administered 2019-03-20: 160 mg via INTRAVENOUS

## 2019-03-20 MED ORDER — VANCOMYCIN HCL IN DEXTROSE 1-5 GM/200ML-% IV SOLN
INTRAVENOUS | Status: AC
  Filled 2019-03-20: qty 200

## 2019-03-20 MED ORDER — ONDANSETRON HCL 4 MG/2ML IJ SOLN
INTRAMUSCULAR | Status: DC | PRN
  Administered 2019-03-20: 4 mg via INTRAVENOUS

## 2019-03-20 MED ORDER — PHENYLEPHRINE 40 MCG/ML (10ML) SYRINGE FOR IV PUSH (FOR BLOOD PRESSURE SUPPORT)
PREFILLED_SYRINGE | INTRAVENOUS | Status: DC | PRN
  Administered 2019-03-20 (×7): 80 ug via INTRAVENOUS

## 2019-03-20 MED ORDER — DEXAMETHASONE SODIUM PHOSPHATE 10 MG/ML IJ SOLN
INTRAMUSCULAR | Status: DC | PRN
  Administered 2019-03-20: 10 mg via INTRAVENOUS

## 2019-03-20 MED ORDER — EPHEDRINE SULFATE-NACL 50-0.9 MG/10ML-% IV SOSY
PREFILLED_SYRINGE | INTRAVENOUS | Status: DC | PRN
  Administered 2019-03-20 (×6): 10 mg via INTRAVENOUS

## 2019-03-20 MED ORDER — GABAPENTIN 300 MG PO CAPS
ORAL_CAPSULE | ORAL | Status: AC
  Filled 2019-03-20: qty 1

## 2019-03-20 MED ORDER — VANCOMYCIN HCL IN DEXTROSE 1-5 GM/200ML-% IV SOLN
1000.0000 mg | Freq: Once | INTRAVENOUS | Status: AC
  Administered 2019-03-20: 1000 mg via INTRAVENOUS

## 2019-03-20 MED ORDER — FENTANYL CITRATE (PF) 100 MCG/2ML IJ SOLN
INTRAMUSCULAR | Status: DC | PRN
  Administered 2019-03-20 (×2): 50 ug via INTRAVENOUS

## 2019-03-20 MED ORDER — GABAPENTIN 300 MG PO CAPS
300.0000 mg | ORAL_CAPSULE | ORAL | Status: AC
  Administered 2019-03-20: 300 mg via ORAL

## 2019-03-20 MED ORDER — FENTANYL CITRATE (PF) 100 MCG/2ML IJ SOLN
INTRAMUSCULAR | Status: AC
  Filled 2019-03-20: qty 2

## 2019-03-20 MED ORDER — BUPIVACAINE HCL (PF) 0.5 % IJ SOLN
INTRAMUSCULAR | Status: DC | PRN
  Administered 2019-03-20: 8 mL

## 2019-03-20 MED ORDER — HYDROCODONE-ACETAMINOPHEN 5-325 MG PO TABS: 1.0000 | ORAL_TABLET | Freq: Four times a day (QID) | ORAL | 0 refills | Status: DC | PRN

## 2019-03-20 MED ORDER — ONDANSETRON HCL 4 MG/2ML IJ SOLN
INTRAMUSCULAR | Status: AC
  Filled 2019-03-20: qty 2

## 2019-03-20 MED ORDER — CELECOXIB 200 MG PO CAPS
ORAL_CAPSULE | ORAL | Status: AC
  Filled 2019-03-20: qty 1

## 2019-03-20 MED ORDER — MIDAZOLAM HCL 2 MG/2ML IJ SOLN
INTRAMUSCULAR | Status: AC
  Filled 2019-03-20: qty 2

## 2019-03-20 MED ORDER — ACETAMINOPHEN 500 MG PO TABS
1000.0000 mg | ORAL_TABLET | ORAL | Status: AC
  Administered 2019-03-20: 1000 mg via ORAL

## 2019-03-20 MED ORDER — VANCOMYCIN HCL IN DEXTROSE 500-5 MG/100ML-% IV SOLN
INTRAVENOUS | Status: AC
  Filled 2019-03-20: qty 100

## 2019-03-20 MED ORDER — PHENYLEPHRINE 40 MCG/ML (10ML) SYRINGE FOR IV PUSH (FOR BLOOD PRESSURE SUPPORT)
PREFILLED_SYRINGE | INTRAVENOUS | Status: AC
  Filled 2019-03-20: qty 10

## 2019-03-20 MED ORDER — LACTATED RINGERS IV SOLN
INTRAVENOUS | Status: DC
  Administered 2019-03-20 (×2): via INTRAVENOUS

## 2019-03-20 MED ORDER — PROPOFOL 10 MG/ML IV BOLUS
INTRAVENOUS | Status: AC
  Filled 2019-03-20: qty 20

## 2019-03-20 SURGICAL SUPPLY — 63 items
APPLIER CLIP 9.375 MED OPEN (MISCELLANEOUS) ×3
BENZOIN TINCTURE PRP APPL 2/3 (GAUZE/BANDAGES/DRESSINGS) IMPLANT
BLADE HEX COATED 2.75 (ELECTRODE) ×3 IMPLANT
BLADE SURG 15 STRL LF DISP TIS (BLADE) ×1 IMPLANT
BLADE SURG 15 STRL SS (BLADE) ×2
BNDG ELASTIC 6X5.8 VLCR STR LF (GAUZE/BANDAGES/DRESSINGS) IMPLANT
CANISTER SUCT 1200ML W/VALVE (MISCELLANEOUS) ×3 IMPLANT
CHLORAPREP W/TINT 26 (MISCELLANEOUS) ×3 IMPLANT
CLIP APPLIE 9.375 MED OPEN (MISCELLANEOUS) ×1 IMPLANT
CLOSURE WOUND 1/2 X4 (GAUZE/BANDAGES/DRESSINGS)
COVER BACK TABLE REUSABLE LG (DRAPES) ×3 IMPLANT
COVER MAYO STAND REUSABLE (DRAPES) ×3 IMPLANT
COVER WAND RF STERILE (DRAPES) IMPLANT
DECANTER SPIKE VIAL GLASS SM (MISCELLANEOUS) IMPLANT
DERMABOND ADVANCED (GAUZE/BANDAGES/DRESSINGS) ×2
DERMABOND ADVANCED .7 DNX12 (GAUZE/BANDAGES/DRESSINGS) ×1 IMPLANT
DRAPE LAPAROTOMY 100X72 PEDS (DRAPES) ×3 IMPLANT
DRAPE LAPAROTOMY TRNSV 102X78 (DRAPES) IMPLANT
DRAPE UTILITY XL STRL (DRAPES) ×3 IMPLANT
ELECT REM PT RETURN 9FT ADLT (ELECTROSURGICAL) ×3
ELECTRODE REM PT RTRN 9FT ADLT (ELECTROSURGICAL) ×1 IMPLANT
GAUZE 4X4 16PLY RFD (DISPOSABLE) IMPLANT
GAUZE SPONGE 4X4 12PLY STRL LF (GAUZE/BANDAGES/DRESSINGS) IMPLANT
GLOVE BIOGEL PI IND STRL 7.0 (GLOVE) ×1 IMPLANT
GLOVE BIOGEL PI INDICATOR 7.0 (GLOVE) ×2
GLOVE ECLIPSE 6.5 STRL STRAW (GLOVE) ×3 IMPLANT
GLOVE SS BIOGEL STRL SZ 7 (GLOVE) ×1 IMPLANT
GLOVE SUPERSENSE BIOGEL SZ 7 (GLOVE) ×2
GOWN STRL REUS W/ TWL LRG LVL3 (GOWN DISPOSABLE) ×1 IMPLANT
GOWN STRL REUS W/ TWL XL LVL3 (GOWN DISPOSABLE) ×1 IMPLANT
GOWN STRL REUS W/TWL LRG LVL3 (GOWN DISPOSABLE) ×2
GOWN STRL REUS W/TWL XL LVL3 (GOWN DISPOSABLE) ×2
HEMOSTAT SNOW SURGICEL 2X4 (HEMOSTASIS) ×3 IMPLANT
KIT MARKER MARGIN INK (KITS) IMPLANT
NEEDLE HYPO 22GX1.5 SAFETY (NEEDLE) IMPLANT
NEEDLE HYPO 25X1 1.5 SAFETY (NEEDLE) ×3 IMPLANT
NS IRRIG 1000ML POUR BTL (IV SOLUTION) ×3 IMPLANT
PACK BASIN DAY SURGERY FS (CUSTOM PROCEDURE TRAY) ×3 IMPLANT
PENCIL SMOKE EVACUATOR (MISCELLANEOUS) ×3 IMPLANT
SLEEVE SCD COMPRESS KNEE MED (MISCELLANEOUS) ×3 IMPLANT
SPONGE LAP 4X18 RFD (DISPOSABLE) ×3 IMPLANT
STAPLER VISISTAT 35W (STAPLE) IMPLANT
STRIP CLOSURE SKIN 1/2X4 (GAUZE/BANDAGES/DRESSINGS) IMPLANT
SUT ETHILON 4 0 PS 2 18 (SUTURE) IMPLANT
SUT MNCRL AB 4-0 PS2 18 (SUTURE) ×3 IMPLANT
SUT SILK 2 0 SH (SUTURE) IMPLANT
SUT SILK 3 0 TIES 17X18 (SUTURE) ×2
SUT SILK 3-0 18XBRD TIE BLK (SUTURE) ×1 IMPLANT
SUT VIC AB 2-0 SH 27 (SUTURE)
SUT VIC AB 2-0 SH 27XBRD (SUTURE) IMPLANT
SUT VIC AB 3-0 FS2 27 (SUTURE) IMPLANT
SUT VIC AB 4-0 P-3 18XBRD (SUTURE) IMPLANT
SUT VIC AB 4-0 P3 18 (SUTURE)
SUT VICRYL 3-0 CR8 SH (SUTURE) ×3 IMPLANT
SUT VICRYL 4-0 PS2 18IN ABS (SUTURE) IMPLANT
SYR 10ML LL (SYRINGE) ×3 IMPLANT
SYR BULB 3OZ (MISCELLANEOUS) IMPLANT
TAPE HYPAFIX 4 X10 (GAUZE/BANDAGES/DRESSINGS) IMPLANT
TOWEL GREEN STERILE FF (TOWEL DISPOSABLE) ×3 IMPLANT
TRAY FAXITRON CT DISP (TRAY / TRAY PROCEDURE) IMPLANT
TUBE CONNECTING 20'X1/4 (TUBING) ×1
TUBE CONNECTING 20X1/4 (TUBING) ×2 IMPLANT
YANKAUER SUCT BULB TIP NO VENT (SUCTIONS) ×3 IMPLANT

## 2019-03-20 NOTE — Op Note (Signed)
Patient Name:           Steve [name]   Date of Surgery:        03/20/2019  Pre op Diagnosis:      Lymphadenopathy, suspect lymphoma  Post op Diagnosis:    Same  Procedure:                 Excision deep right cervical lymph node, posterior triangle  Surgeon:                     Edsel Petrin. Dalbert Batman, M.D., FACS  Assistant:                      OR staff  Operative Indications:   Patient is referred by Dr. Maylon Peppers at the cancer center for evaluation of right supraclavicular enlarged lymph nodes. Patient had presented to his primary care provider with complaints of asymmetry in the right neck. On palpation he was felt to have lymphadenopathy. He underwent an ultrasound on February 19, 2019. This showed 2 enlarged lymph nodes in the right supraclavicular region measuring 3.9 cm and 2.9 cm respectively. Suspicion was raised over malignancy or lymphoma. Patient was referred to the cancer center and following evaluation he has undergone CT scans of the neck chest and abdomen.  He has right supraclavicular adenopathy, paratracheal adenopathy, and periportal and retroperitoneal adenopathy.  There is a palpable mass, nontender, right supraclavicular area, posterior triangle.     A core needle biopsy of the lymph nodes in the right supraclavicular region has been Done and is nondiagnostic. His oncologist has requested excision of deep right supraclavicular lymph node to establish diagnosis..  That is appropriate.       Patient has not had any other symptoms related to the supraclavicular mass. He denies any pain. He denies any night sweats. He denies any changes in weight. His had no fevers.      He'll be scheduled for excision deep right supraclavicular lymph nodes under general anesthesia as an urgent case. Marland Kitchen  He agrees with this plan.  Operative Findings:       There was a pathologically enlarged but smooth mass in the right posterior triangle just above the clavicle, deep to the omohyoid,  posterior and deep to the anterior jugular vein.  Lateral to the clavicular head of the sternocleidomastoid muscle.  This is approximately 4 x 3 cm in size.  It was removed intact.  There were small lymphatic and vascular attachments which were controlled with silk ties and metal clips.  We kept the dissection in the capsule of this mass so as to reduce chance of injuring major nerve or vascular structures.  Procedure in Detail:          Following the induction of general endotracheal anesthesia the patient was positioned with a roll behind his shoulders shoulders, right arm tucked and the head turned to the left.  The neck and upper chest were prepped and draped in a sterile fashion.  Intravenous antibiotics were given.  Surgical timeout was performed.  0.5% Marcaine was used as a local infiltration anesthetic.     A transverse supraclavicular skin crease incision was made.  Dissection was carried down through the platysma muscle.  A couple of small venous structures were isolated, ligated in continuity with 3-0 silk and divided.  I dissected the very large anterior jugular vein so that I could retract it laterally.  I carried the dissection lateral and deep  to the clavicular head of the sternocleidomastoid muscle.  The omohyoid muscle was dissected away and retractors were set in place.  I could palpate the lymph node.  I entered the capsule and then circumferentially dissected it away from the surrounding tissues.  A lot of the dissection was done by blunt dissection.  Areolar tissues were divided with cautery.  A few tiny vascular attachments were controlled with metal clips and divided.  Great care was taken to avoid blind or deep dissection to avoid injury to the spinal accessory nerve.  The lymph node was removed intact and there seemed to be minimal bleeding.  The lymph node was sent fresh, immediately to the pathology lab for lymphoma work-up.     Hemostasis was good.  The wound was irrigated.  I placed  some snow hemostatic sponge in the base of the wound and there did not appear to be any bleeding.  The platysma muscle was closed with interrupted 3-0 Vicryl's and the skin closed with a running subcuticular 4-0 Monocryl and Dermabond.  The patient tolerated the procedure well and was taken to PACU in stable condition.  EBL 15 cc or less.  Counts correct.  Complications none.    Addendum:        I logged onto the PMP aware website and reviewed his prescription medication history     Michella Detjen M. Dalbert Batman, M.D., FACS General and Minimally Invasive Surgery Breast and Colorectal Surgery  03/20/2019 2:49 PM

## 2019-03-20 NOTE — Anesthesia Postprocedure Evaluation (Signed)
Anesthesia Post Note  [name]  Procedure(s) Performed: DEEP RIGHT SUPRACLAVICULAR LYMPH NODE EXCISION (Right Neck)     Patient location during evaluation: PACU Anesthesia Type: General Level of consciousness: awake and alert Pain management: pain level controlled Vital Signs Assessment: post-procedure vital signs reviewed and stable Respiratory status: spontaneous breathing, nonlabored ventilation and respiratory function stable Cardiovascular status: blood pressure returned to baseline and stable Postop Assessment: no apparent nausea or vomiting Anesthetic complications: no    Last Vitals:  Vitals:   03/20/19 1515 03/20/19 1530  BP: 120/77 122/82  Pulse: 71 65  Resp: 18 12  Temp:    SpO2: 95% 94%    Last Pain:  Vitals:   03/20/19 1530  TempSrc:   PainSc: 0-No pain                 Lidia Collum

## 2019-03-20 NOTE — Progress Notes (Signed)
Called patient to request that he does not eat or drink anything until his procedure today, due to the possibility that he may need sedation. Pt states that he drank 6 oz of coffee at 0835 this morning but agrees not to eat or drink anything else prior to procedure.

## 2019-03-20 NOTE — Anesthesia Preprocedure Evaluation (Addendum)
Anesthesia Evaluation  Patient identified by MRN, date of birth, ID band Patient awake    Reviewed: Allergy & Precautions, NPO status , Patient's Chart, lab work & pertinent test results  History of Anesthesia Complications Negative for: history of anesthetic complications  Airway Mallampati: II  TM Distance: >3 FB Neck ROM: Full    Dental   Pulmonary sleep apnea and Continuous Positive Airway Pressure Ventilation ,    Pulmonary exam normal        Cardiovascular hypertension, Pt. on medications Normal cardiovascular exam     Neuro/Psych negative neurological ROS  negative psych ROS   GI/Hepatic Neg liver ROS, GERD  ,  Endo/Other  diabetes, Type 2, Oral Hypoglycemic Agents  Renal/GU negative Renal ROS  negative genitourinary   Musculoskeletal negative musculoskeletal ROS (+)   Abdominal   Peds  Hematology negative hematology ROS (+)   Anesthesia Other Findings   Reproductive/Obstetrics                            Anesthesia Physical Anesthesia Plan  ASA: II  Anesthesia Plan: General   Post-op Pain Management:    Induction: Intravenous  PONV Risk Score and Plan: 2 and Ondansetron, Dexamethasone, Treatment may vary due to age or medical condition and Midazolam  Airway Management Planned: Oral ETT  Additional Equipment: None  Intra-op Plan:   Post-operative Plan: Extubation in OR  Informed Consent: I have reviewed the patients History and Physical, chart, labs and discussed the procedure including the risks, benefits and alternatives for the proposed anesthesia with the patient or authorized representative who has indicated his/her understanding and acceptance.     Dental advisory given  Plan Discussed with:   Anesthesia Plan Comments: (Pt had coffee with creamer at ~0800, will plan for surgery at 1400  )       Anesthesia Quick Evaluation

## 2019-03-20 NOTE — Discharge Instructions (Signed)
Sit up in a chair or elevate head of bed for the next 36 hours.  Ice pack on wound for 10 minutes at a time, intermittently, while awake for 24 hours  You may shower tomorrow, but no tub baths  Do not drive a car until you can turn your neck right and left with full range of motion  Hopefully we can call a preliminary pathology report to you on Friday. Certainly, Dr. Maylon Peppers should be able to tell you your diagnosis on Monday  Let us know if Dr. Maylon Peppers wants [place] to insert a Port-A-Cath.     No Tylenol until 4:45 PM on 03/20/2019. No Ibuprofen until 6:45 PM on 03/20/2019.     Post Anesthesia Home Care Instructions  Activity: Get plenty of rest for the remainder of the day. A responsible individual must stay with you for 24 hours following the procedure.  For the next 24 hours, DO NOT: -Drive a car -Paediatric nurse -Drink alcoholic beverages -Take any medication unless instructed by your physician -Make any legal decisions or sign important papers.  Meals: Start with liquid foods such as gelatin or soup. Progress to regular foods as tolerated. Avoid greasy, spicy, heavy foods. If nausea and/or vomiting occur, drink only clear liquids until the nausea and/or vomiting subsides. Call your physician if vomiting continues.  Special Instructions/Symptoms: Your throat may feel dry or sore from the anesthesia or the breathing tube placed in your throat during surgery. If this causes discomfort, gargle with warm salt water. The discomfort should disappear within 24 hours.  If you had a scopolamine patch placed behind your ear for the management of post- operative nausea and/or vomiting:  1. The medication in the patch is effective for 72 hours, after which it should be removed.  Wrap patch in a tissue and discard in the trash. Wash hands thoroughly with soap and water. 2. You may remove the patch earlier than 72 hours if you experience unpleasant side effects which may include dry mouth,  dizziness or visual disturbances. 3. Avoid touching the patch. Wash your hands with soap and water after contact with the patch.

## 2019-03-20 NOTE — Anesthesia Procedure Notes (Signed)
Procedure Name: Intubation Date/Time: 03/20/2019 2:05 PM Performed by: Gwyndolyn Saxon, CRNA Pre-anesthesia Checklist: Patient identified, Emergency Drugs available, Suction available and Patient being monitored Patient Re-evaluated:Patient Re-evaluated prior to induction Oxygen Delivery Method: Circle system utilized Preoxygenation: Pre-oxygenation with 100% oxygen Induction Type: IV induction Ventilation: Mask ventilation without difficulty and Oral airway inserted - appropriate to patient size Laryngoscope Size: Sabra Heck and 2 Grade View: Grade I Tube type: Oral Tube size: 7.0 mm Number of attempts: 1 Airway Equipment and Method: Stylet Placement Confirmation: ETT inserted through vocal cords under direct vision,  positive ETCO2 and breath sounds checked- equal and bilateral Secured at: 21 cm Tube secured with: Tape Dental Injury: Teeth and Oropharynx as per pre-operative assessment

## 2019-03-20 NOTE — Transfer of Care (Signed)
Immediate Anesthesia Transfer of Care Note  [name]  Procedure(s) Performed: DEEP RIGHT SUPRACLAVICULAR LYMPH NODE EXCISION (Right Neck)  Patient Location: PACU  Anesthesia Type:General  Level of Consciousness: drowsy  Airway & Oxygen Therapy: Patient Spontanous Breathing and Patient connected to face mask oxygen  Post-op Assessment: Report given to RN and Post -op Vital signs reviewed and stable  Post vital signs: Reviewed and stable  Last Vitals:  Vitals Value Taken Time  BP 114/75 03/20/19 1455  Temp    Pulse 79 03/20/19 1458  Resp 23 03/20/19 1458  SpO2 100 % 03/20/19 1458  Vitals shown include unvalidated device data.  Last Pain:  Vitals:   03/20/19 1032  TempSrc: Tympanic  PainSc: 0-No pain      Patients Stated Pain Goal: 7 (36/[id number]/40 [id number])  Complications: No apparent anesthesia complications

## 2019-03-20 NOTE — Interval H&P Note (Signed)
History and Physical Interval Note:  03/20/2019 10:25 AM  Steve [name]  has presented today for surgery, with the diagnosis of SUPRACLAVICULAR LYMPHADENOPATHY.  The various methods of treatment have been discussed with the patient and family. After consideration of risks, benefits and other options for treatment, the patient has consented to  Procedure(s): LYMPH NODE BIOPSY (N/A) as a surgical intervention.  The patient's history has been reviewed, patient examined, no change in status, stable for surgery.  I have reviewed the patient's chart and labs.  Questions were answered to the patient's satisfaction.     [name]

## 2019-03-21 ENCOUNTER — Other Ambulatory Visit: Payer: Self-pay

## 2019-03-21 ENCOUNTER — Encounter: Payer: Self-pay | Admitting: *Deleted

## 2019-03-22 LAB — SURGICAL PATHOLOGY

## 2019-03-25 ENCOUNTER — Inpatient Hospital Stay (HOSPITAL_BASED_OUTPATIENT_CLINIC_OR_DEPARTMENT_OTHER): Payer: BC Managed Care – PPO | Admitting: Hematology

## 2019-03-25 ENCOUNTER — Encounter: Payer: Self-pay | Admitting: Hematology

## 2019-03-25 ENCOUNTER — Other Ambulatory Visit: Payer: Self-pay

## 2019-03-25 ENCOUNTER — Inpatient Hospital Stay: Payer: BC Managed Care – PPO | Attending: Hematology

## 2019-03-25 ENCOUNTER — Encounter: Payer: Self-pay | Admitting: *Deleted

## 2019-03-25 VITALS — BP 122/78 | HR 66 | Temp 97.1°F | Resp 18 | Ht 66.0 in | Wt 190.8 lb

## 2019-03-25 DIAGNOSIS — L03221 Cellulitis of neck: Secondary | ICD-10-CM | POA: Diagnosis not present

## 2019-03-25 DIAGNOSIS — D649 Anemia, unspecified: Secondary | ICD-10-CM

## 2019-03-25 DIAGNOSIS — D72829 Elevated white blood cell count, unspecified: Secondary | ICD-10-CM

## 2019-03-25 DIAGNOSIS — Z5111 Encounter for antineoplastic chemotherapy: Secondary | ICD-10-CM | POA: Insufficient documentation

## 2019-03-25 DIAGNOSIS — Z79899 Other long term (current) drug therapy: Secondary | ICD-10-CM | POA: Diagnosis not present

## 2019-03-25 DIAGNOSIS — Z7984 Long term (current) use of oral hypoglycemic drugs: Secondary | ICD-10-CM | POA: Insufficient documentation

## 2019-03-25 DIAGNOSIS — R590 Localized enlarged lymph nodes: Secondary | ICD-10-CM

## 2019-03-25 DIAGNOSIS — Z7952 Long term (current) use of systemic steroids: Secondary | ICD-10-CM | POA: Diagnosis not present

## 2019-03-25 DIAGNOSIS — C8118 Nodular sclerosis classical Hodgkin lymphoma, lymph nodes of multiple sites: Secondary | ICD-10-CM

## 2019-03-25 DIAGNOSIS — T451X5A Adverse effect of antineoplastic and immunosuppressive drugs, initial encounter: Secondary | ICD-10-CM | POA: Insufficient documentation

## 2019-03-25 DIAGNOSIS — M542 Cervicalgia: Secondary | ICD-10-CM | POA: Diagnosis not present

## 2019-03-25 LAB — CMP (CANCER CENTER ONLY)
ALT: 14 U/L (ref 0–44)
AST: 13 U/L — ABNORMAL LOW (ref 15–41)
Albumin: 4.1 g/dL (ref 3.5–5.0)
Alkaline Phosphatase: 63 U/L (ref 38–126)
Anion gap: 7 (ref 5–15)
BUN: 14 mg/dL (ref 8–23)
CO2: 30 mmol/L (ref 22–32)
Calcium: 9.1 mg/dL (ref 8.9–10.3)
Chloride: 103 mmol/L (ref 98–111)
Creatinine: 0.93 mg/dL (ref 0.61–1.24)
GFR, Est AFR Am: >60 mL/min (ref >60)
GFR, Estimated: >60 mL/min (ref >60)
Glucose, Bld: 143 mg/dL — ABNORMAL HIGH (ref 70–99)
Potassium: 4.5 mmol/L (ref 3.5–5.1)
Sodium: 140 mmol/L (ref 135–145)
Total Bilirubin: 0.9 mg/dL (ref 0.3–1.2)
Total Protein: 7.1 g/dL (ref 6.5–8.1)

## 2019-03-25 LAB — CBC WITH DIFFERENTIAL (CANCER CENTER ONLY)
Abs Immature Granulocytes: 0.04 10*3/uL (ref 0.00–0.07)
Basophils Absolute: 0 10*3/uL (ref 0.0–0.1)
Basophils Relative: 0 %
Eosinophils Absolute: 0.2 10*3/uL (ref 0.0–0.5)
Eosinophils Relative: 1 %
HCT: 38.9 % — ABNORMAL LOW (ref 39.0–52.0)
Hemoglobin: 12.7 g/dL — ABNORMAL LOW (ref 13.0–17.0)
Immature Granulocytes: 0 %
Lymphocytes Relative: 17 %
Lymphs Abs: 2 10*3/uL (ref 0.7–4.0)
MCH: 28.1 pg (ref 26.0–34.0)
MCHC: 32.6 g/dL (ref 30.0–36.0)
MCV: 86.1 fL (ref 80.0–100.0)
Monocytes Absolute: 1.1 10*3/uL — ABNORMAL HIGH (ref 0.1–1.0)
Monocytes Relative: 9 %
Neutro Abs: 8.6 10*3/uL — ABNORMAL HIGH (ref 1.7–7.7)
Neutrophils Relative %: 73 %
Platelet Count: 265 10*3/uL (ref 150–400)
RBC: 4.52 MIL/uL (ref 4.22–5.81)
RDW: 14 % (ref 11.5–15.5)
WBC Count: 12 10*3/uL — ABNORMAL HIGH (ref 4.0–10.5)
nRBC: 0 % (ref 0.0–0.2)

## 2019-03-25 LAB — LACTATE DEHYDROGENASE: LDH: 263 U/L — ABNORMAL HIGH (ref 98–192)

## 2019-03-25 MED ORDER — PROCHLORPERAZINE MALEATE 10 MG PO TABS: 10.0000 mg | ORAL_TABLET | Freq: Four times a day (QID) | ORAL | 1 refills | Status: DC | PRN

## 2019-03-25 MED ORDER — LORAZEPAM 0.5 MG PO TABS: 0.5000 mg | ORAL_TABLET | Freq: Four times a day (QID) | ORAL | 0 refills | Status: DC | PRN

## 2019-03-25 MED ORDER — DEXAMETHASONE 4 MG PO TABS: ORAL_TABLET | ORAL | 1 refills | Status: DC

## 2019-03-25 MED ORDER — SULFAMETHOXAZOLE-TRIMETHOPRIM 800-160 MG PO TABS: 1.0000 | ORAL_TABLET | Freq: Two times a day (BID) | ORAL | 0 refills | Status: AC

## 2019-03-25 MED ORDER — ONDANSETRON HCL 8 MG PO TABS: 8.0000 mg | ORAL_TABLET | Freq: Two times a day (BID) | ORAL | 1 refills | Status: DC | PRN

## 2019-03-25 MED ORDER — LIDOCAINE-PRILOCAINE 2.5-2.5 % EX CREA: TOPICAL_CREAM | CUTANEOUS | 3 refills | Status: DC

## 2019-03-25 NOTE — Progress Notes (Signed)
Steve OFFICE PROGRESS NOTE  Patient Care Team: Saguier, Steve [name] as PCP - General (Internal Medicine) Steve Men, MD as Medical Oncologist (Oncology) Steve Poche, RN as Oncology Nurse Navigator  HEME/ONC OVERVIEW: 1. Classic Hodgkin lymphoma, nodular sclerosis subtype; at least Stage III -02/2019: enlarged R supraclavicular LN's (~4 and 3cm) on neck US   Enlarged cervical, bulky R supraclavicular, mediastinal, and RP adenopathy  Core R cervical LN bx non-diagnostic   Excisional LN bx showed classic Hodgkin lymphoma, nodular sclerosis subtype   ASSESSMENT & PLAN:    Classic Hodgkin lymphoma, nodular sclerosis subtype; at least Stage III -I reviewed the pathology results in detail with the patient -To complete the staging, I have ordered PET to assess the extent of the disease -As he will require chemotherapy, I also have ordered echocardiogram to assess baseline cardiac function and port for chemotherapy limitation -I discussed the NCCN guideline in detail with the patient -Given the bulky and extensive disease demonstrated on CT, I would recommend AVD + Adcetris w/ G-CSF support as the first line treatment regimen -We discussed some of the risks, benefits and side-effects of AVD + Adcetris w/ G-CSF. -Some of the short term side-effects included, though not limited to, risk of fatigue, weight loss, tumor lysis syndrome, risk of allergic reactions, pancytopenia, life-threatening infections, need for transfusions of blood products, nausea, vomiting, change in bowel habits, admission to hospital for various reasons, and risks of death.  -Long term side-effects are also discussed including permanent damage to nerve function, chronic fatigue, and rare secondary malignancy including bone marrow disorders.  -The patient is aware that the response rates discussed earlier is not guaranteed.   -After a long discussion, patient made an informed decision to proceed with the  prescribed plan of care.  -He will require chemotherapy education prior to starting treatment  -We will tentatively start treatment on 04/08/2019 -I have also prescribed PRN anti-medics, including Zofran, Compazine, Ativan, and the dexamethasone  Right neck pain -Secondary to recent excisional lymph node biopsy -Given elevated leukocytosis and slight increased warmth with palpation, I have prescribed Bactrim x 7 days for possible cellulitis -Patient is instructed to contact the clinic if he develops fever, purulent drainage, or worsening pain -If pain is not controlled with Norco, we can adjust the pain regimen as needed   Leukocytosis -Likely reactive in the setting of recent excisional lymph node biopsy and possible cellulitis -WBC 12.0k today -Antibiotic as above -We will monitor it for now  Normocytic anemia -Likely due to anemia of chronic disease -PET scan as above to rule out any bone marrow involvement -Hgb 12.7 today, slightly lower than last visit -Patient denies any symptoms of bleeding -We will monitor it for now  Orders Placed This Encounter  Procedures  . PET, initial (skull base to thigh)    Standing Status:   Future    Standing Expiration Date:   03/24/2020    Order Specific Question:   ** REASON FOR EXAM (FREE TEXT)    Answer:   Staging    Order Specific Question:   If indicated for the ordered procedure, I authorize the administration of a radiopharmaceutical per Radiology protocol    Answer:   Yes    Order Specific Question:   Preferred imaging location?    Answer:   [facility]    Order Specific Question:   Radiology Contrast Protocol - do NOT remove file path    Answer:   [email or web address]  . IR  port placement Steve [name] Long)    Standing Status:   Future    Standing Expiration Date:   05/25/2020    Order Specific Question:   Reason for Exam (SYMPTOM  OR DIAGNOSIS REQUIRED)    Answer:   Newly diagnosed Hodgkin lymphoma,  need chemo access    Order Specific Question:   Preferred Imaging Location?    Answer:   [facility]  . ECHOCARDIOGRAM COMPLETE    Standing Status:   Future    Standing Expiration Date:   06/22/2020    Order Specific Question:   Where should this test be performed    Answer:   [facility]    Order Specific Question:   Perflutren DEFINITY (image enhancing agent) should be administered unless hypersensitivity or allergy exist    Answer:   Administer Perflutren    Order Specific Question:   Reason for exam-Echo    Answer:   Chemo  V67.2 / Z09    Order Specific Question:   Other Comments    Answer:   Hodgkin lymphoma. assess cardiac function    All questions were answered. The patient knows to call the clinic with any problems, questions or concerns. No barriers to learning was detected.  Return in 2 weeks for labs, port flush, clinic appt and C1D1 of chemotherapy.   Steve Men, MD 03/25/2019 9:39 AM  CHIEF COMPLAINT: "My neck is sore"  INTERVAL HISTORY: [name] returns to clinic for follow-up of recent excisional lymph node biopsy results.  Patient reports that over the past few weeks, the right cervical lymph nodes have steadily increased in size.  After the recent lymph node biopsy, there was increased swelling and pain in the biopsy site, for which he was prescribed Norco with some pain relief.  He also has been applying ice packs, which seemed to help with the swelling.  He denies any dysphagia or dyspnea associated with cervical lymph node enlargement.  He denies any constitutional symptoms.  He denies any history of smoking.  He still works 3 times a day.  He denies any other complaint today.  REVIEW OF SYSTEMS:   Constitutional: ( - ) fevers, ( - )  chills , ( - ) night sweats Eyes: ( - ) blurriness of vision, ( - ) double vision, ( - ) watery eyes Ears, nose, mouth, throat, and face: ( - ) mucositis, ( + ) sore throat Respiratory: ( - ) cough, ( - )  dyspnea, ( - ) wheezes Cardiovascular: ( - ) palpitation, ( - ) chest discomfort, ( - ) lower extremity swelling Gastrointestinal:  ( - ) nausea, ( - ) heartburn, ( - ) change in bowel habits Skin: ( - ) abnormal skin rashes Lymphatics: ( + ) lymphadenopathy, ( - ) easy bruising Neurological: ( - ) numbness, ( - ) tingling, ( - ) new weaknesses Behavioral/Psych: ( - ) mood change, ( - ) new changes  All other systems were reviewed with the patient and are negative.  SUMMARY OF ONCOLOGIC HISTORY: Oncology History   No history exists.    I have reviewed the past medical history, past surgical history, social history and family history with the patient and they are unchanged from previous note.  ALLERGIES:  has No Known Allergies.  MEDICATIONS:  Current Outpatient Medications  Medication Sig Dispense Refill  . atorvastatin (LIPITOR) 10 MG tablet TAKE 1 TABLET BY MOUTH EVERY DAY 90 tablet 1  . HYDROcodone-acetaminophen (NORCO) 5-325 MG tablet Take  1-2 tablets by mouth every 6 (six) hours as needed for moderate pain or severe pain. 20 tablet 0  . losartan (COZAAR) 100 MG tablet TAKE 1 TABLET BY MOUTH EVERY DAY 90 tablet 1  . metFORMIN (GLUCOPHAGE) 500 MG tablet TAKE 1 TABLET BY MOUTH TWICE A DAY WITH MEALS 180 tablet 1  . sildenafil (VIAGRA) 100 MG tablet Take 0.5-1 tablets (50-100 mg total) by mouth daily as needed for erectile dysfunction. 10 tablet 1  . sulfamethoxazole-trimethoprim (BACTRIM DS) 800-160 MG tablet Take 1 tablet by mouth 2 (two) times daily for 7 days. 14 tablet 0   No current facility-administered medications for this visit.    PHYSICAL EXAMINATION: ECOG PERFORMANCE STATUS: 0 - Asymptomatic  Today's Vitals   03/25/19 0850  BP: 122/78  Pulse: 66  Resp: 18  Temp: (!) 97.1 F (36.2 C)  TempSrc: Temporal  SpO2: 98%  Weight: 190 lb 12.8 oz (86.5 kg)  Height: 5\' 6"  (1.676 m)  PainSc: 0-No pain   Body mass index is 30.8 kg/m.  Filed Weights   03/25/19 0850   Weight: 190 lb 12.8 oz (86.5 kg)    GENERAL: alert, no distress and comfortable SKIN: skin color, texture, turgor are normal, no rashes or significant lesions EYES: conjunctiva are pink and non-injected, sclera clear OROPHARYNX: no exudate, no erythema; lips, buccal mucosa, and tongue normal  NECK: supple, non-tender LYMPH:  bulky right supraclavicular lymphadenopathy, mildly tender with palpation, the excision LN site healing without any purulent drainage, slightly warm to touch  LUNGS: clear to auscultation with normal breathing effort HEART: regular rate & rhythm and no murmurs and no lower extremity edema ABDOMEN: soft, non-tender, non-distended, normal bowel sounds Musculoskeletal: no cyanosis of digits and no clubbing  PSYCH: alert & oriented x 3, fluent speech  LABORATORY DATA:  I have reviewed the data as listed    Component Value Date/Time   NA 140 03/25/2019 0808   K 4.5 03/25/2019 0808   CL 103 03/25/2019 0808   CO2 30 03/25/2019 0808   GLUCOSE 143 (H) 03/25/2019 0808   BUN 14 03/25/2019 0808   CREATININE 0.93 03/25/2019 0808   CALCIUM 9.1 03/25/2019 0808   PROT 7.1 03/25/2019 0808   ALBUMIN 4.1 03/25/2019 0808   AST 13 (L) 03/25/2019 0808   ALT 14 03/25/2019 0808   ALKPHOS 63 03/25/2019 0808   BILITOT 0.9 03/25/2019 0808   GFRNONAA >60 03/25/2019 0808   GFRAA >60 03/25/2019 0808    No results found for: SPEP, UPEP  Lab Results  Component Value Date   WBC 12.0 (H) 03/25/2019   NEUTROABS 8.6 (H) 03/25/2019   HGB 12.7 (L) 03/25/2019   HCT 38.9 (L) 03/25/2019   MCV 86.1 03/25/2019   PLT 265 03/25/2019      Chemistry      Component Value Date/Time   NA 140 03/25/2019 0808   K 4.5 03/25/2019 0808   CL 103 03/25/2019 0808   CO2 30 03/25/2019 0808   BUN 14 03/25/2019 0808   CREATININE 0.93 03/25/2019 0808      Component Value Date/Time   CALCIUM 9.1 03/25/2019 0808   ALKPHOS 63 03/25/2019 0808   AST 13 (L) 03/25/2019 0808   ALT 14 03/25/2019 0808    BILITOT 0.9 03/25/2019 0808       RADIOGRAPHIC STUDIES: I have personally reviewed the radiological images as listed below and agreed with the findings in the report. CT Soft Tissue Neck W Contrast  Result Date: 02/28/2019 CLINICAL DATA:  Enlarged right supraclavicular lymph nodes EXAM: CT NECK WITH CONTRAST TECHNIQUE: Multidetector CT imaging of the neck was performed using the standard protocol following the bolus administration of intravenous contrast. CONTRAST:  115mL OMNIPAQUE IOHEXOL 300 MG/ML  SOLN COMPARISON:  CT chest 02/28/2019 FINDINGS: Pharynx and larynx: Normal. No mass or swelling. Salivary glands: No inflammation, mass, or stone. Thyroid: Normal thyroid size.  Negative for nodule Lymph nodes: Right supra supraclavicular lymph node mass. Adjacent lymph nodes measure 26 x 52 mm, and 30 x 22 mm. Left supraclavicular region is normal. No other lymphadenopathy in the neck. Vascular: Normal vascular enhancement. Limited intracranial: Negative Visualized orbits: Negative Mastoids and visualized paranasal sinuses: Negative Skeleton: Cervical spondylosis.  No acute skeletal abnormality. Upper chest: Chest CT findings reported separately. Large right paratracheal lymph node 6 cm in diameter. Other: None IMPRESSION: Right paratracheal enlarged lymph node and right supraclavicular enlarged lymph nodes compatible with neoplasm. No pharyngeal mass in the neck. No other adenopathy in the neck. Tissue sampling is recommended. Electronically Signed   By: Franchot Gallo M.D.   On: 02/28/2019 15:56   CT chest w/ contrast  Result Date: 02/28/2019 CLINICAL DATA:  Patient with new diagnosis of right neck skin cancer. Enlarged cervical adenopathy and supraclavicular adenopathy. EXAM: CT CHEST, ABDOMEN, AND PELVIS WITH CONTRAST TECHNIQUE: Multidetector CT imaging of the chest, abdomen and pelvis was performed following the standard protocol during bolus administration of intravenous contrast. CONTRAST:   129mL OMNIPAQUE IOHEXOL 300 MG/ML  SOLN COMPARISON:  CT abdomen pelvis 01/04/2013 FINDINGS: CT CHEST FINDINGS Cardiovascular: Normal heart size. No pericardial effusion. Aorta and main pulmonary artery normal in caliber. Thoracic aortic vascular calcifications. Mediastinum/Nodes: Enlarged right supraclavicular lymph nodes measuring up to 2.8 cm in thickness (image 5; series 3). There is a large right paratracheal lymph node measuring 4.8 x 5.9 cm (image 17; series 3). This exerts mass effect on the adjacent trachea. There is a 1.5 cm prevascular node (image 17; series 3). Small hiatal hernia. Normal appearance of the esophagus. Lungs/Pleura: Central airways are patent. Bilateral subpleural reticular opacities. Associated bronchiectasis. No discrete pulmonary nodules are identified. No pleural effusion or pneumothorax. Musculoskeletal: Thoracic spine degenerative changes. No aggressive or acute appearing osseous lesions. CT ABDOMEN PELVIS FINDINGS Hepatobiliary: Stable subcentimeter too small to characterize low-attenuation lesion caudate lobe (image 55; series 3). Gallbladder is unremarkable. No intrahepatic or extrahepatic biliary ductal dilatation. Pancreas: Unremarkable Spleen: Unremarkable Adrenals/Urinary Tract: Normal adrenal glands. Kidneys enhance symmetrically with contrast. No hydronephrosis. Urinary bladder poorly visualized due to streak artifact. Stomach/Bowel: Sigmoid colonic diverticulosis. No CT evidence for acute diverticulitis. No evidence for small bowel obstruction. No free fluid or free intraperitoneal air. Normal morphology of the stomach. Vascular/Lymphatic: Normal caliber abdominal aorta. 1.4 cm precaval node (image 64; series 3). 1.1 cm retrocaval node (image 73; series 3). 1.4 cm aortocaval node (image 80; series 3). 1.2 cm left periaortic node (image 81; series 3). Reproductive: Poorly visualized. Other: Small fat containing left inguinal hernia. Musculoskeletal: Bilateral hip  arthroplasties. Lower thoracic and lumbar spine degenerative changes. No aggressive or acute appearing osseous lesions. IMPRESSION: 1. Bulky right supraclavicular and mediastinal adenopathy most compatible with metastatic disease. Additionally there is porta hepatic and retroperitoneal adenopathy within the abdomen concerning for metastatic disease. 2. Mild fibrotic changes involving the lungs bilaterally. 3. See dedicated neck CT report. Electronically Signed   By: Lovey Newcomer M.D.   On: 02/28/2019 14:05   CT AP w/ contrast  Result Date: 02/28/2019 CLINICAL DATA:  Patient with new diagnosis  of right neck skin cancer. Enlarged cervical adenopathy and supraclavicular adenopathy. EXAM: CT CHEST, ABDOMEN, AND PELVIS WITH CONTRAST TECHNIQUE: Multidetector CT imaging of the chest, abdomen and pelvis was performed following the standard protocol during bolus administration of intravenous contrast. CONTRAST:  152mL OMNIPAQUE IOHEXOL 300 MG/ML  SOLN COMPARISON:  CT abdomen pelvis 01/04/2013 FINDINGS: CT CHEST FINDINGS Cardiovascular: Normal heart size. No pericardial effusion. Aorta and main pulmonary artery normal in caliber. Thoracic aortic vascular calcifications. Mediastinum/Nodes: Enlarged right supraclavicular lymph nodes measuring up to 2.8 cm in thickness (image 5; series 3). There is a large right paratracheal lymph node measuring 4.8 x 5.9 cm (image 17; series 3). This exerts mass effect on the adjacent trachea. There is a 1.5 cm prevascular node (image 17; series 3). Small hiatal hernia. Normal appearance of the esophagus. Lungs/Pleura: Central airways are patent. Bilateral subpleural reticular opacities. Associated bronchiectasis. No discrete pulmonary nodules are identified. No pleural effusion or pneumothorax. Musculoskeletal: Thoracic spine degenerative changes. No aggressive or acute appearing osseous lesions. CT ABDOMEN PELVIS FINDINGS Hepatobiliary: Stable subcentimeter too small to characterize  low-attenuation lesion caudate lobe (image 55; series 3). Gallbladder is unremarkable. No intrahepatic or extrahepatic biliary ductal dilatation. Pancreas: Unremarkable Spleen: Unremarkable Adrenals/Urinary Tract: Normal adrenal glands. Kidneys enhance symmetrically with contrast. No hydronephrosis. Urinary bladder poorly visualized due to streak artifact. Stomach/Bowel: Sigmoid colonic diverticulosis. No CT evidence for acute diverticulitis. No evidence for small bowel obstruction. No free fluid or free intraperitoneal air. Normal morphology of the stomach. Vascular/Lymphatic: Normal caliber abdominal aorta. 1.4 cm precaval node (image 64; series 3). 1.1 cm retrocaval node (image 73; series 3). 1.4 cm aortocaval node (image 80; series 3). 1.2 cm left periaortic node (image 81; series 3). Reproductive: Poorly visualized. Other: Small fat containing left inguinal hernia. Musculoskeletal: Bilateral hip arthroplasties. Lower thoracic and lumbar spine degenerative changes. No aggressive or acute appearing osseous lesions. IMPRESSION: 1. Bulky right supraclavicular and mediastinal adenopathy most compatible with metastatic disease. Additionally there is porta hepatic and retroperitoneal adenopathy within the abdomen concerning for metastatic disease. 2. Mild fibrotic changes involving the lungs bilaterally. 3. See dedicated neck CT report. Electronically Signed   By: Lovey Newcomer M.D.   On: 02/28/2019 14:05   [place] CORE BIOPSY (LYMPH NODES)  Result Date: 03/08/2019 INDICATION: No known primary, now with indeterminate right supraclavicular lymphadenopathy. Please perform CT-guided biopsy for tissue diagnostic purposes. EXAM: ULTRASOUND-GUIDED RIGHT SUPRACLAVICULAR LYMPH NODE BIOPSY COMPARISON:  Neck CT - 02/28/2019 MEDICATIONS: None ANESTHESIA/SEDATION: Moderate (conscious) sedation was employed during this procedure. A total of Versed 2 mg and Fentanyl 100 mcg was administered intravenously. Moderate Sedation Time: 10  minutes. The patient's level of consciousness and vital signs were monitored continuously by radiology nursing throughout the procedure under my direct supervision. COMPLICATIONS: None immediate. TECHNIQUE: Informed written consent was obtained from the patient after a discussion of the risks, benefits and alternatives to treatment. Questions regarding the procedure were encouraged and answered. Initial ultrasound scanning demonstrated a large (approximately 4.2 x 2.2 cm) right supraclavicular lymph node correlating with the dominant supraclavicular lymph node seen on preceding contrast-enhanced neck CT image 99, series 3. An ultrasound image was saved for documentation purposes. The procedure was planned. A timeout was performed prior to the initiation of the procedure. The operative was prepped and draped in the usual sterile fashion, and a sterile drape was applied covering the operative field. A timeout was performed prior to the initiation of the procedure. Local anesthesia was provided with 1% lidocaine with epinephrine. Under direct  ultrasound guidance, an 18 gauge core needle device was utilized to obtain to obtain 8 core needle biopsies of the dominant right supraclavicular lymph node. The samples were placed in saline and submitted to pathology. The needle was removed and hemostasis was achieved with manual compression. Post procedure scan was negative for significant hematoma. A dressing was placed. The patient tolerated the procedure well without immediate postprocedural complication. IMPRESSION: Technically successful ultrasound guided biopsy of dominant right supraclavicular lymph node. Electronically Signed   By: Sandi Mariscal M.D.   On: 03/08/2019 14:33

## 2019-03-25 NOTE — Progress Notes (Signed)
START ON PATHWAY REGIMEN - Lymphoma and CLL     A cycle is every 28 days:     Doxorubicin      Vinblastine      Dacarbazine      Brentuximab vedotin   **Always confirm dose/schedule in your pharmacy ordering system**  Patient Characteristics: Classical Hodgkin Lymphoma, First Line, Stage III / IV, Age < 61 Disease Type: Not Applicable Disease Type: Not Applicable Disease Type: Classical Hodgkin Lymphoma Line of therapy: First Line [place] Stage: IIIB Age: < 60 Intent of Therapy: Curative Intent, Discussed with Patient

## 2019-03-26 ENCOUNTER — Encounter: Payer: Self-pay | Admitting: *Deleted

## 2019-03-26 NOTE — Progress Notes (Signed)
Patient was seen yesterday with a confirmed diagnosis of Hodkins Lymphoma. Plan to start treatment on 04/08/19. Prior to treatment he needs:  Echo: Scheduled for 04/01/19 @ 2:15 PET: Scheduled for 04/03/19 @ 8:00 Port: Scheduled for 03/29/19 @ 9:00 Education: Scheduled for 04/02/19 @ 1:00 Nutrition Consult: 04/02/19 @ 12:00  Spoke with patient and confirmed all appointments, with date, time, location and needed prep. He understands. He has my contact information if any questions or concerns arise.

## 2019-03-28 ENCOUNTER — Encounter (HOSPITAL_COMMUNITY): Payer: Self-pay | Admitting: *Deleted

## 2019-03-28 ENCOUNTER — Other Ambulatory Visit: Payer: Self-pay | Admitting: Radiology

## 2019-03-28 ENCOUNTER — Other Ambulatory Visit: Payer: Self-pay | Admitting: Student

## 2019-03-29 ENCOUNTER — Encounter (HOSPITAL_COMMUNITY): Payer: Self-pay

## 2019-03-29 ENCOUNTER — Other Ambulatory Visit: Payer: Self-pay

## 2019-03-29 ENCOUNTER — Ambulatory Visit (HOSPITAL_COMMUNITY)
Admission: RE | Admit: 2019-03-29 | Discharge: 2019-03-29 | Disposition: A | Discharge status: Home / Self Care | Payer: BC Managed Care – PPO | Source: Ambulatory Visit | Attending: Hematology | Admitting: Hematology

## 2019-03-29 DIAGNOSIS — K219 Gastro-esophageal reflux disease without esophagitis: Secondary | ICD-10-CM | POA: Insufficient documentation

## 2019-03-29 DIAGNOSIS — G4730 Sleep apnea, unspecified: Secondary | ICD-10-CM | POA: Diagnosis not present

## 2019-03-29 DIAGNOSIS — E119 Type 2 diabetes mellitus without complications: Secondary | ICD-10-CM | POA: Insufficient documentation

## 2019-03-29 DIAGNOSIS — Z79890 Hormone replacement therapy (postmenopausal): Secondary | ICD-10-CM | POA: Diagnosis not present

## 2019-03-29 DIAGNOSIS — Z79899 Other long term (current) drug therapy: Secondary | ICD-10-CM | POA: Diagnosis not present

## 2019-03-29 DIAGNOSIS — I10 Essential (primary) hypertension: Secondary | ICD-10-CM | POA: Diagnosis not present

## 2019-03-29 DIAGNOSIS — C8118 Nodular sclerosis classical Hodgkin lymphoma, lymph nodes of multiple sites: Secondary | ICD-10-CM | POA: Diagnosis present

## 2019-03-29 HISTORY — PX: IR IMAGING GUIDED PORT INSERTION: IMG5740

## 2019-03-29 LAB — BASIC METABOLIC PANEL
Anion gap: 9 (ref 5–15)
BUN: 20 mg/dL (ref 8–23)
CO2: 25 mmol/L (ref 22–32)
Calcium: 9.5 mg/dL (ref 8.9–10.3)
Chloride: 105 mmol/L (ref 98–111)
Creatinine, Ser: 0.9 mg/dL (ref 0.61–1.24)
GFR calc Af Amer: >60 mL/min (ref >60)
GFR calc non Af Amer: >60 mL/min (ref >60)
Glucose, Bld: 113 mg/dL — ABNORMAL HIGH (ref 70–99)
Potassium: 4.4 mmol/L (ref 3.5–5.1)
Sodium: 139 mmol/L (ref 135–145)

## 2019-03-29 LAB — CBC
HCT: 41.9 % (ref 39.0–52.0)
Hemoglobin: 13.4 g/dL (ref 13.0–17.0)
MCH: 28.2 pg (ref 26.0–34.0)
MCHC: 32 g/dL (ref 30.0–36.0)
MCV: 88.2 fL (ref 80.0–100.0)
Platelets: 278 10*3/uL (ref 150–400)
RBC: 4.75 MIL/uL (ref 4.22–5.81)
RDW: 14.2 % (ref 11.5–15.5)
WBC: 10.7 10*3/uL — ABNORMAL HIGH (ref 4.0–10.5)
nRBC: 0 % (ref 0.0–0.2)

## 2019-03-29 LAB — GLUCOSE, CAPILLARY: Glucose-Capillary: 116 mg/dL — ABNORMAL HIGH (ref 70–99)

## 2019-03-29 LAB — PROTIME-INR
INR: 1 (ref 0.8–1.2)
Prothrombin Time: 12.9 seconds (ref 11.4–15.2)

## 2019-03-29 MED ORDER — HEPARIN SOD (PORK) LOCK FLUSH 100 UNIT/ML IV SOLN
INTRAVENOUS | Status: AC
  Filled 2019-03-29: qty 5

## 2019-03-29 MED ORDER — CEFAZOLIN SODIUM-DEXTROSE 2-4 GM/100ML-% IV SOLN: 2.0000 g | INTRAVENOUS | Status: AC

## 2019-03-29 MED ORDER — MIDAZOLAM HCL 2 MG/2ML IJ SOLN
INTRAMUSCULAR | Status: AC
  Filled 2019-03-29: qty 2

## 2019-03-29 MED ORDER — FENTANYL CITRATE (PF) 100 MCG/2ML IJ SOLN
INTRAMUSCULAR | Status: AC | PRN
  Administered 2019-03-29 (×2): 50 ug via INTRAVENOUS

## 2019-03-29 MED ORDER — LIDOCAINE-EPINEPHRINE (PF) 2 %-1:200000 IJ SOLN
INTRAMUSCULAR | Status: AC | PRN
  Administered 2019-03-29 (×2): 10 mL

## 2019-03-29 MED ORDER — LIDOCAINE-EPINEPHRINE (PF) 2 %-1:200000 IJ SOLN
INTRAMUSCULAR | Status: AC
  Filled 2019-03-29: qty 20

## 2019-03-29 MED ORDER — CEFAZOLIN SODIUM-DEXTROSE 2-4 GM/100ML-% IV SOLN
INTRAVENOUS | Status: AC
  Administered 2019-03-29: 2 g via INTRAVENOUS
  Filled 2019-03-29: qty 100

## 2019-03-29 MED ORDER — HEPARIN SOD (PORK) LOCK FLUSH 100 UNIT/ML IV SOLN
INTRAVENOUS | Status: AC | PRN
  Administered 2019-03-29: 500 [IU] via INTRAVENOUS

## 2019-03-29 MED ORDER — SODIUM CHLORIDE 0.9 % IV SOLN: INTRAVENOUS | Status: DC

## 2019-03-29 MED ORDER — FENTANYL CITRATE (PF) 100 MCG/2ML IJ SOLN
INTRAMUSCULAR | Status: AC
  Filled 2019-03-29: qty 2

## 2019-03-29 MED ORDER — MIDAZOLAM HCL 2 MG/2ML IJ SOLN
INTRAMUSCULAR | Status: AC | PRN
  Administered 2019-03-29 (×2): 1 mg via INTRAVENOUS

## 2019-03-29 NOTE — Procedures (Signed)
Lymphoma  S/p LT IJ POWER PORT  TIP SVCRA NO COMP STABLE EBL MIN FULL REPORT IN PACS

## 2019-03-29 NOTE — H&P (Signed)
Chief Complaint: Patient was seen in consultation today for port placement  Referring Physician(s): Zhao,Yan  Supervising Physician: Daryll Brod  Patient Status: [facility] - Out-pt  History of Present Illness: Steve [name] is a 63 y.o. male with a past medical history of GERD, DM, pancreatitis, HTN and Hodgkin lymphoma followed by Dr. Maylon Peppers who presents today for port placement. Steve [name] presented to his PCP in November of this year due to painless right neck swelling, an [place] was done and showed enlarged right supraclavicular lymph nodes. He was referred to oncology for further evaluation and was seen in IR on 11/27 for a lymph node biopsy, pathology from this biopsy showed atypical lymphoid proliferation. He then underwent excisional lymph node biopsy on 12/9 with Dr. Dalbert Batman, pathology from this biopsy showed predominance of T lymphocytes with nonspecific changes. He was then seen by oncology on 12/14 and decision was made to proceed with chemotherapy. IR has been asked to place a port today for chemotherapy access.   Steve [name] denies any complaints except for a little bit of soreness at the excisional biopsy site. He has been feeling well overall and is trying to take everything one day at a time. He is planned for chemotherapy to begin on 12/28. He states understanding of requested procedure and wishes to proceed.   Past Medical History:  Diagnosis Date  . Abscess of muscle 08/10/2011   staph infection of right hip   . Diabetes mellitus without complication ([place])   . GERD (gastroesophageal reflux disease)    rare reflux - no meds for reflux - NO PROBLEM IN PAST SEVERAL YRS  . Hip dysplasia, congenital    no surgery as a child for hip dysplasia - has had bilateral hip replacements as an adult  . Hodgkin lymphoma ([place])   . Hypertension   . Pancreatitis   . Postoperative anemia due to acute blood loss 09/07/2012  . Septic arthritis of hip ([place]) 09/05/2012   PT'S TOTAL HIP  JOINT REMOVED - ANTIBIOTIC SPACE PLACED AND PT HAS FINISHED IV ANTIBIOTICS ( PICC LINE REMOVED)  . Sleep apnea    USES CPAP    Past Surgical History:  Procedure Laterality Date  . COLONOSCOPY  04/26/2007  . HERNIA REPAIR     inguinal hernia x3  . JOINT REPLACEMENT  2002 & 2007   bilateral hip replacement  . LYMPH NODE BIOPSY Right 03/20/2019   Procedure: DEEP RIGHT SUPRACLAVICULAR LYMPH NODE EXCISION;  Surgeon: Fanny Skates, MD;  Location: [place];  Service: General;  Laterality: Right;  . MULTIPLE EXTRACTIONS WITH ALVEOLOPLASTY  07/28/2011   Procedure: MULTIPLE EXTRACION WITH ALVEOLOPLASTY;  Surgeon: Lenn Cal, DDS;  Location: [facility];  Service: Oral Surgery;  Laterality: N/A;  Extraction of tooth #'s 2,3,4,5,6,11,12,13,15,19,22 with alveoloplasty.  . shoulder repair - right for separation of shoulder    . TEE WITHOUT CARDIOVERSION  07/29/2011   Procedure: TRANSESOPHAGEAL ECHOCARDIOGRAM (TEE);  Surgeon: Josue Hector, MD;  Location: [place];  Service: Cardiovascular;  Laterality: N/A;  . TOTAL HIP REVISION Right 09/05/2012   Procedure: RIGHT HIP RESECTION ARTHROPLASTY WITH ANTIBIOTIC SPACERS;  Surgeon: [name], MD;  Location: [facility];  Service: Orthopedics;  Laterality: Right;  . TOTAL HIP REVISION Right 11/30/2012   Procedure: RIGHT TOTAL HIP ARTHROPLASTY REIMPLANTATION;  Surgeon: [name], MD;  Location: [facility];  Service: Orthopedics;  Laterality: Right;    Allergies: Patient has no known allergies.  Medications: Prior to Admission medications  Medication Sig Start Date End Date Taking? Authorizing Provider  atorvastatin (LIPITOR) 10 MG tablet TAKE 1 TABLET BY MOUTH EVERY DAY 10/18/18   Saguier, Percell Miller, PA-C  dexamethasone (DECADRON) 4 MG tablet Take 2 tablets by mouth once a day for 3 days after chemo. Take with food. 03/25/19   Tish Men, MD  HYDROcodone-acetaminophen (NORCO) 5-325 MG tablet Take 1-2 tablets by mouth every 6 (six) hours  as needed for moderate pain or severe pain. 03/20/19   Fanny Skates, MD  lidocaine-prilocaine (EMLA) cream Apply to affected area once 03/25/19   Tish Men, MD  LORazepam (ATIVAN) 0.5 MG tablet Take 1 tablet (0.5 mg total) by mouth every 6 (six) hours as needed (Nausea or vomiting). 03/25/19   Tish Men, MD  losartan (COZAAR) 100 MG tablet TAKE 1 TABLET BY MOUTH EVERY DAY 02/12/19   Saguier, Percell Miller, PA-C  metFORMIN (GLUCOPHAGE) 500 MG tablet TAKE 1 TABLET BY MOUTH TWICE A DAY WITH MEALS 02/12/19   Saguier, Percell Miller, PA-C  ondansetron (ZOFRAN) 8 MG tablet Take 1 tablet (8 mg total) by mouth 2 (two) times daily as needed. Start on the third day after chemotherapy. 03/25/19   Tish Men, MD  prochlorperazine (COMPAZINE) 10 MG tablet Take 1 tablet (10 mg total) by mouth every 6 (six) hours as needed (Nausea or vomiting). 03/25/19   Tish Men, MD  sildenafil (VIAGRA) 100 MG tablet Take 0.5-1 tablets (50-100 mg total) by mouth daily as needed for erectile dysfunction. 06/13/17   Saguier, Percell Miller, PA-C  sulfamethoxazole-trimethoprim (BACTRIM DS) 800-160 MG tablet Take 1 tablet by mouth 2 (two) times daily for 7 days. 03/25/19 04/01/19  Tish Men, MD     Family History  Problem Relation Age of Onset  . Melanoma Mother   . Heart attack Mother   . Hyperlipidemia Neg Hx   . Sudden death Neg Hx   . Hypertension Neg Hx   . Diabetes Neg Hx     Social History   Socioeconomic History  . Marital status: Married    Spouse name: Not on file  . Number of children: Not on file  . Years of education: Not on file  . Highest education level: Not on file  Occupational History  . Not on file  Tobacco Use  . Smoking status: Never Smoker  . Smokeless tobacco: Never Used  Substance and Sexual Activity  . Alcohol use: Yes    Comment: rare beer. 5 times a year at most. 2 beers when he drinks.  . Drug use: No  . Sexual activity: Yes    Partners: Female  Other Topics Concern  . Not on file  Social History  Narrative   Married, 1 son and 3 sons.   Welder/fabricator   2 caffeinated beverages daily   Social Determinants of Health   Financial Resource Strain:   . Difficulty of Paying Living Expenses: Not on file  Food Insecurity:   . Worried About Charity fundraiser in the Last Year: Not on file  . Ran Out of Food in the Last Year: Not on file  Transportation Needs:   . Lack of Transportation (Medical): Not on file  . Lack of Transportation (Non-Medical): Not on file  Physical Activity:   . Days of Exercise per Week: Not on file  . Minutes of Exercise per Session: Not on file  Stress:   . Feeling of Stress : Not on file  Social Connections:   . Frequency of Communication with Friends and Family: Not  on file  . Frequency of Social Gatherings with Friends and Family: Not on file  . Attends Religious Services: Not on file  . Active Member of Clubs or Organizations: Not on file  . Attends Archivist Meetings: Not on file  . Marital Status: Not on file     Review of Systems: A 12 point ROS discussed and pertinent positives are indicated in the HPI above.  All other systems are negative.  Review of Systems  Constitutional: Negative for appetite change, chills and fever.  HENT: Negative for nosebleeds.   Respiratory: Negative for cough and shortness of breath.   Cardiovascular: Negative for chest pain.  Gastrointestinal: Negative for abdominal pain, blood in stool, diarrhea, nausea and vomiting.  Genitourinary: Negative for hematuria.  Musculoskeletal: Negative for back pain.  Skin: Positive for wound (right neck from biopsy). Negative for rash.  Neurological: Negative for dizziness and headaches.    Vital Signs: BP 125/83 (BP Location: Right Arm)   Pulse 62   Temp 97.8 F (36.6 C) (Oral)   Resp 18   SpO2 98%   Physical Exam Vitals reviewed.  Constitutional:      General: He is not in acute distress. HENT:     Head: Normocephalic.     Mouth/Throat:     Mouth:  Mucous membranes are moist.     Pharynx: Oropharynx is clear. No oropharyngeal exudate or posterior oropharyngeal erythema.  Cardiovascular:     Rate and Rhythm: Normal rate and regular rhythm.  Pulmonary:     Effort: Pulmonary effort is normal.     Breath sounds: Normal breath sounds.  Abdominal:     General: Bowel sounds are normal. There is no distension.     Palpations: Abdomen is soft.     Tenderness: There is no abdominal tenderness.  Skin:    General: Skin is warm and dry.     Comments: (+) well healing incision right supraclavicular region - slight edema, non-tender, no erythema, bleeding or drainage.   Neurological:     Mental Status: He is alert and oriented to person, place, and time.  Psychiatric:        Mood and Affect: Mood normal.        Behavior: Behavior normal.        Thought Content: Thought content normal.        Judgment: Judgment normal.      MD Evaluation Airway: WNL Heart: WNL Abdomen: WNL Chest/ Lungs: WNL ASA  Classification: 2 Mallampati/Airway Score: One   Imaging: CT Soft Tissue Neck W Contrast  Result Date: 02/28/2019 CLINICAL DATA:  Enlarged right supraclavicular lymph nodes EXAM: CT NECK WITH CONTRAST TECHNIQUE: Multidetector CT imaging of the neck was performed using the standard protocol following the bolus administration of intravenous contrast. CONTRAST:  153mL OMNIPAQUE IOHEXOL 300 MG/ML  SOLN COMPARISON:  CT chest 02/28/2019 FINDINGS: Pharynx and larynx: Normal. No mass or swelling. Salivary glands: No inflammation, mass, or stone. Thyroid: Normal thyroid size.  Negative for nodule Lymph nodes: Right supra supraclavicular lymph node mass. Adjacent lymph nodes measure 26 x 52 mm, and 30 x 22 mm. Left supraclavicular region is normal. No other lymphadenopathy in the neck. Vascular: Normal vascular enhancement. Limited intracranial: Negative Visualized orbits: Negative Mastoids and visualized paranasal sinuses: Negative Skeleton: Cervical  spondylosis.  No acute skeletal abnormality. Upper chest: Chest CT findings reported separately. Large right paratracheal lymph node 6 cm in diameter. Other: None IMPRESSION: Right paratracheal enlarged lymph node and right supraclavicular enlarged  lymph nodes compatible with neoplasm. No pharyngeal mass in the neck. No other adenopathy in the neck. Tissue sampling is recommended. Electronically Signed   By: Franchot Gallo M.D.   On: 02/28/2019 15:56   CT chest w/ contrast  Result Date: 02/28/2019 CLINICAL DATA:  Patient with new diagnosis of right neck skin cancer. Enlarged cervical adenopathy and supraclavicular adenopathy. EXAM: CT CHEST, ABDOMEN, AND PELVIS WITH CONTRAST TECHNIQUE: Multidetector CT imaging of the chest, abdomen and pelvis was performed following the standard protocol during bolus administration of intravenous contrast. CONTRAST:  19mL OMNIPAQUE IOHEXOL 300 MG/ML  SOLN COMPARISON:  CT abdomen pelvis 01/04/2013 FINDINGS: CT CHEST FINDINGS Cardiovascular: Normal heart size. No pericardial effusion. Aorta and main pulmonary artery normal in caliber. Thoracic aortic vascular calcifications. Mediastinum/Nodes: Enlarged right supraclavicular lymph nodes measuring up to 2.8 cm in thickness (image 5; series 3). There is a large right paratracheal lymph node measuring 4.8 x 5.9 cm (image 17; series 3). This exerts mass effect on the adjacent trachea. There is a 1.5 cm prevascular node (image 17; series 3). Small hiatal hernia. Normal appearance of the esophagus. Lungs/Pleura: Central airways are patent. Bilateral subpleural reticular opacities. Associated bronchiectasis. No discrete pulmonary nodules are identified. No pleural effusion or pneumothorax. Musculoskeletal: Thoracic spine degenerative changes. No aggressive or acute appearing osseous lesions. CT ABDOMEN PELVIS FINDINGS Hepatobiliary: Stable subcentimeter too small to characterize low-attenuation lesion caudate lobe (image 55; series 3).  Gallbladder is unremarkable. No intrahepatic or extrahepatic biliary ductal dilatation. Pancreas: Unremarkable Spleen: Unremarkable Adrenals/Urinary Tract: Normal adrenal glands. Kidneys enhance symmetrically with contrast. No hydronephrosis. Urinary bladder poorly visualized due to streak artifact. Stomach/Bowel: Sigmoid colonic diverticulosis. No CT evidence for acute diverticulitis. No evidence for small bowel obstruction. No free fluid or free intraperitoneal air. Normal morphology of the stomach. Vascular/Lymphatic: Normal caliber abdominal aorta. 1.4 cm precaval node (image 64; series 3). 1.1 cm retrocaval node (image 73; series 3). 1.4 cm aortocaval node (image 80; series 3). 1.2 cm left periaortic node (image 81; series 3). Reproductive: Poorly visualized. Other: Small fat containing left inguinal hernia. Musculoskeletal: Bilateral hip arthroplasties. Lower thoracic and lumbar spine degenerative changes. No aggressive or acute appearing osseous lesions. IMPRESSION: 1. Bulky right supraclavicular and mediastinal adenopathy most compatible with metastatic disease. Additionally there is porta hepatic and retroperitoneal adenopathy within the abdomen concerning for metastatic disease. 2. Mild fibrotic changes involving the lungs bilaterally. 3. See dedicated neck CT report. Electronically Signed   By: Lovey Newcomer M.D.   On: 02/28/2019 14:05   CT AP w/ contrast  Result Date: 02/28/2019 CLINICAL DATA:  Patient with new diagnosis of right neck skin cancer. Enlarged cervical adenopathy and supraclavicular adenopathy. EXAM: CT CHEST, ABDOMEN, AND PELVIS WITH CONTRAST TECHNIQUE: Multidetector CT imaging of the chest, abdomen and pelvis was performed following the standard protocol during bolus administration of intravenous contrast. CONTRAST:  186mL OMNIPAQUE IOHEXOL 300 MG/ML  SOLN COMPARISON:  CT abdomen pelvis 01/04/2013 FINDINGS: CT CHEST FINDINGS Cardiovascular: Normal heart size. No pericardial effusion.  Aorta and main pulmonary artery normal in caliber. Thoracic aortic vascular calcifications. Mediastinum/Nodes: Enlarged right supraclavicular lymph nodes measuring up to 2.8 cm in thickness (image 5; series 3). There is a large right paratracheal lymph node measuring 4.8 x 5.9 cm (image 17; series 3). This exerts mass effect on the adjacent trachea. There is a 1.5 cm prevascular node (image 17; series 3). Small hiatal hernia. Normal appearance of the esophagus. Lungs/Pleura: Central airways are patent. Bilateral subpleural reticular opacities. Associated bronchiectasis. No  discrete pulmonary nodules are identified. No pleural effusion or pneumothorax. Musculoskeletal: Thoracic spine degenerative changes. No aggressive or acute appearing osseous lesions. CT ABDOMEN PELVIS FINDINGS Hepatobiliary: Stable subcentimeter too small to characterize low-attenuation lesion caudate lobe (image 55; series 3). Gallbladder is unremarkable. No intrahepatic or extrahepatic biliary ductal dilatation. Pancreas: Unremarkable Spleen: Unremarkable Adrenals/Urinary Tract: Normal adrenal glands. Kidneys enhance symmetrically with contrast. No hydronephrosis. Urinary bladder poorly visualized due to streak artifact. Stomach/Bowel: Sigmoid colonic diverticulosis. No CT evidence for acute diverticulitis. No evidence for small bowel obstruction. No free fluid or free intraperitoneal air. Normal morphology of the stomach. Vascular/Lymphatic: Normal caliber abdominal aorta. 1.4 cm precaval node (image 64; series 3). 1.1 cm retrocaval node (image 73; series 3). 1.4 cm aortocaval node (image 80; series 3). 1.2 cm left periaortic node (image 81; series 3). Reproductive: Poorly visualized. Other: Small fat containing left inguinal hernia. Musculoskeletal: Bilateral hip arthroplasties. Lower thoracic and lumbar spine degenerative changes. No aggressive or acute appearing osseous lesions. IMPRESSION: 1. Bulky right supraclavicular and mediastinal  adenopathy most compatible with metastatic disease. Additionally there is porta hepatic and retroperitoneal adenopathy within the abdomen concerning for metastatic disease. 2. Mild fibrotic changes involving the lungs bilaterally. 3. See dedicated neck CT report. Electronically Signed   By: Lovey Newcomer M.D.   On: 02/28/2019 14:05   [place] CORE BIOPSY (LYMPH NODES)  Result Date: 03/08/2019 INDICATION: No known primary, now with indeterminate right supraclavicular lymphadenopathy. Please perform CT-guided biopsy for tissue diagnostic purposes. EXAM: ULTRASOUND-GUIDED RIGHT SUPRACLAVICULAR LYMPH NODE BIOPSY COMPARISON:  Neck CT - 02/28/2019 MEDICATIONS: None ANESTHESIA/SEDATION: Moderate (conscious) sedation was employed during this procedure. A total of Versed 2 mg and Fentanyl 100 mcg was administered intravenously. Moderate Sedation Time: 10 minutes. The patient's level of consciousness and vital signs were monitored continuously by radiology nursing throughout the procedure under my direct supervision. COMPLICATIONS: None immediate. TECHNIQUE: Informed written consent was obtained from the patient after a discussion of the risks, benefits and alternatives to treatment. Questions regarding the procedure were encouraged and answered. Initial ultrasound scanning demonstrated a large (approximately 4.2 x 2.2 cm) right supraclavicular lymph node correlating with the dominant supraclavicular lymph node seen on preceding contrast-enhanced neck CT image 99, series 3. An ultrasound image was saved for documentation purposes. The procedure was planned. A timeout was performed prior to the initiation of the procedure. The operative was prepped and draped in the usual sterile fashion, and a sterile drape was applied covering the operative field. A timeout was performed prior to the initiation of the procedure. Local anesthesia was provided with 1% lidocaine with epinephrine. Under direct ultrasound guidance, an 18 gauge core  needle device was utilized to obtain to obtain 8 core needle biopsies of the dominant right supraclavicular lymph node. The samples were placed in saline and submitted to pathology. The needle was removed and hemostasis was achieved with manual compression. Post procedure scan was negative for significant hematoma. A dressing was placed. The patient tolerated the procedure well without immediate postprocedural complication. IMPRESSION: Technically successful ultrasound guided biopsy of dominant right supraclavicular lymph node. Electronically Signed   By: Sandi Mariscal M.D.   On: 03/08/2019 14:33    Labs:  CBC: Recent Labs    02/19/19 0916 02/26/19 1151 03/08/19 1142 03/25/19 0808  WBC 9.6 10.8* 9.7 12.0*  HGB 13.0 13.7 13.7 12.7*  HCT 39.0 42.9 43.2 38.9*  PLT 288.0 268 264 265    COAGS: Recent Labs    03/08/19 1142  INR 1.1  BMP: Recent Labs    02/19/19 0916 02/26/19 1151 03/25/19 0808  NA 137 139 140  K 4.3 4.7 4.5  CL 102 102 103  CO2 30 30 30   GLUCOSE 135* 119* 143*  BUN 13 19 14   CALCIUM 9.1 9.6 9.1  CREATININE 0.81 0.91 0.93  GFRNONAA  --  >60 >60  GFRAA  --  >60 >60    LIVER FUNCTION TESTS: Recent Labs    02/19/19 0916 02/26/19 1151 03/25/19 0808  BILITOT 0.7 0.9 0.9  AST 14 16 13*  ALT 13 16 14   ALKPHOS 69 68 63  PROT 6.9 7.2 7.1  ALBUMIN 4.1 4.5 4.1    TUMOR MARKERS: No results for input(s): AFPTM, CEA, CA199, CHROMGRNA in the last 8760 hours.  Assessment and Plan:  64 y/o M with recently diagnosed Hodgkin's lymphoma followed by Dr. Maylon Peppers who presents today for port placement to begin chemotherapy.  Patient has been NPO since 9 pm last night, he does not take any blood thinning medications. Afebrile, WBC 10.7 (currently on Decadron), hgb 13.4, plt 278, INR 1.0.   Risks and benefits of image guided port-a-catheter placement were discussed with the patient including, but not limited to bleeding, infection, pneumothorax, or fibrin sheath  development and need for additional procedures.  All of the patient's questions were answered, patient is agreeable to proceed.  Consent signed and in chart.  Thank you for this interesting consult.  I greatly enjoyed meeting Steve [name] and look forward to participating in their care.  A copy of this report was sent to the requesting provider on this date.  Electronically Signed: Joaquim Nam, PA-C 03/29/2019, 8:11 AM   I spent a total of 30 Minutes in face to face in clinical consultation, greater than 50% of which was counseling/coordinating care for port placement.

## 2019-03-29 NOTE — Discharge Instructions (Signed)
Please call Interventional Radiology clinic at [phone] with any questions or concerns.  DO NOT use EMLA cream for 2 weeks after port placement as this cream will remove surgical glue on your incision.  You may remove your dressing and shower tomorrow  Moderate Conscious Sedation, Adult, Care After These instructions provide you with information about caring for yourself after your procedure. Your health care provider may also give you more specific instructions. Your treatment has been planned according to current medical practices, but problems sometimes occur. Call your health care provider if you have any problems or questions after your procedure. What can I expect after the procedure? After your procedure, it is common:  To feel sleepy for several hours.  To feel clumsy and have poor balance for several hours.  To have poor judgment for several hours.  To vomit if you eat too soon. Follow these instructions at home: For at least 24 hours after the procedure:   Do not: ? Participate in activities where you could fall or become injured. ? Drive. ? Use heavy machinery. ? Drink alcohol. ? Take sleeping pills or medicines that cause drowsiness. ? Make important decisions or sign legal documents. ? Take care of children on your own.  Rest. Eating and drinking  Follow the diet recommended by your health care provider.  If you vomit: ? Drink water, juice, or soup when you can drink without vomiting. ? Make sure you have little or no nausea before eating solid foods. General instructions  Have a responsible adult stay with you until you are awake and alert.  Take over-the-counter and prescription medicines only as told by your health care provider.  If you smoke, do not smoke without supervision.  Keep all follow-up visits as told by your health care provider. This is important. Contact a health care provider if:  You keep feeling nauseous or you keep  vomiting.  You feel light-headed.  You develop a rash.  You have a fever. Get help right away if:  You have trouble breathing. This information is not intended to replace advice given to you by your health care provider. Make sure you discuss any questions you have with your health care provider. Document Released: 01/16/2013 Document Revised: 03/10/2017 Document Reviewed: 07/18/2015 Elsevier Patient Education  [place] Insertion, Care After This sheet gives you information about how to care for yourself after your procedure. Your health care provider may also give you more specific instructions. If you have problems or questions, contact your health care provider. What can I expect after the procedure? After the procedure, it is common to have:  Discomfort at the port insertion site.  Bruising on the skin over the port. This should improve over 3-4 days. Follow these instructions at home: [facility] care  After your port is placed, you will get a manufacturer's information card. The card has information about your port. Keep this card with you at all times.  Take care of the port as told by your health care provider. Ask your health care provider if you or a family member can get training for taking care of the port at home. A home health care nurse may also take care of the port.  Make sure to remember what type of port you have. Incision care      Follow instructions from your health care provider about how to take care of your port insertion site. Make sure you: ? Wash your hands with soap  and water before and after you change your bandage (dressing). If soap and water are not available, use hand sanitizer. ? Change your dressing as told by your health care provider. ? Leave stitches (sutures), skin glue, or adhesive strips in place. These skin closures may need to stay in place for 2 weeks or longer. If adhesive strip edges start to loosen and curl  up, you may trim the loose edges. Do not remove adhesive strips completely unless your health care provider tells you to do that.  Check your port insertion site every day for signs of infection. Check for: ? Redness, swelling, or pain. ? Fluid or blood. ? Warmth. ? Pus or a bad smell. Activity  Return to your normal activities as told by your health care provider. Ask your health care provider what activities are safe for you.  Do not lift anything that is heavier than 10 lb (4.5 kg), or the limit that you are told, until your health care provider says that it is safe. General instructions  Take over-the-counter and prescription medicines only as told by your health care provider.  Do not take baths, swim, or use a hot tub until your health care provider approves. Ask your health care provider if you may take showers. You may only be allowed to take sponge baths.  Do not drive for 24 hours if you were given a sedative during your procedure.  Wear a medical alert bracelet in case of an emergency. This will tell any health care providers that you have a port.  Keep all follow-up visits as told by your health care provider. This is important. Contact a health care provider if:  You cannot flush your port with saline as directed, or you cannot draw blood from the port.  You have a fever or chills.  You have redness, swelling, or pain around your port insertion site.  You have fluid or blood coming from your port insertion site.  Your port insertion site feels warm to the touch.  You have pus or a bad smell coming from the port insertion site. Get help right away if:  You have chest pain or shortness of breath.  You have bleeding from your port that you cannot control. Summary  Take care of the port as told by your health care provider. Keep the manufacturer's information card with you at all times.  Change your dressing as told by your health care provider.  Contact a  health care provider if you have a fever or chills or if you have redness, swelling, or pain around your port insertion site.  Keep all follow-up visits as told by your health care provider. This information is not intended to replace advice given to you by your health care provider. Make sure you discuss any questions you have with your health care provider. Document Released: 01/16/2013 Document Revised: 10/24/2017 Document Reviewed: 10/24/2017 Elsevier Patient Education  [place].

## 2019-03-29 NOTE — Discharge Instructions (Addendum)
For problems or questions regarding the venous access device, contact [facility] or IR on call at [phone].  DO NOT use EMLA cream for 2 weeks after port placement as this cream will remove surgical glue on your incision.  You may remove your dressing and shower tomorrow.  Moderate Conscious Sedation, Adult, Care After These instructions provide you with information about caring for yourself after your procedure. Your health care provider may also give you more specific instructions. Your treatment has been planned according to current medical practices, but problems sometimes occur. Call your health care provider if you have any problems or questions after your procedure. What can I expect after the procedure? After your procedure, it is common:  To feel sleepy for several hours.  To feel clumsy and have poor balance for several hours.  To have poor judgment for several hours.  To vomit if you eat too soon. Follow these instructions at home: For at least 24 hours after the procedure:   Do not: ? Participate in activities where you could fall or become injured. ? Drive. ? Use heavy machinery. ? Drink alcohol. ? Take sleeping pills or medicines that cause drowsiness. ? Make important decisions or sign legal documents. ? Take care of children on your own.  Rest. Eating and drinking  Follow the diet recommended by your health care provider.  If you vomit: ? Drink water, juice, or soup when you can drink without vomiting. ? Make sure you have little or no nausea before eating solid foods. General instructions  Have a responsible adult stay with you until you are awake and alert.  Take over-the-counter and prescription medicines only as told by your health care provider.  If you smoke, do not smoke without supervision.  Keep all follow-up visits as told by your health care provider. This is important. Contact a health care provider if:  You keep  feeling nauseous or you keep vomiting.  You feel light-headed.  You develop a rash.  You have a fever. Get help right away if:  You have trouble breathing. This information is not intended to replace advice given to you by your health care provider. Make sure you discuss any questions you have with your health care provider. Document Released: 01/16/2013 Document Revised: 03/10/2017 Document Reviewed: 07/18/2015 Elsevier Patient Education  [place] Insertion, Care After This sheet gives you information about how to care for yourself after your procedure. Your health care provider may also give you more specific instructions. If you have problems or questions, contact your health care provider. What can I expect after the procedure? After the procedure, it is common to have:  Discomfort at the port insertion site.  Bruising on the skin over the port. This should improve over 3-4 days. Follow these instructions at home: [facility] care  After your port is placed, you will get a manufacturer's information card. The card has information about your port. Keep this card with you at all times.  Take care of the port as told by your health care provider. Ask your health care provider if you or a family member can get training for taking care of the port at home. A home health care nurse may also take care of the port.  Make sure to remember what type of port you have. Incision care      Follow instructions from your health care provider about how to take care of your port insertion site. Make  sure you: ? Wash your hands with soap and water before and after you change your bandage (dressing). If soap and water are not available, use hand sanitizer. ? Change your dressing as told by your health care provider. ? Leave stitches (sutures), skin glue, or adhesive strips in place. These skin closures may need to stay in place for 2 weeks or longer. If adhesive strip  edges start to loosen and curl up, you may trim the loose edges. Do not remove adhesive strips completely unless your health care provider tells you to do that.  Check your port insertion site every day for signs of infection. Check for: ? Redness, swelling, or pain. ? Fluid or blood. ? Warmth. ? Pus or a bad smell. Activity  Return to your normal activities as told by your health care provider. Ask your health care provider what activities are safe for you.  Do not lift anything that is heavier than 10 lb (4.5 kg), or the limit that you are told, until your health care provider says that it is safe. General instructions  Take over-the-counter and prescription medicines only as told by your health care provider.  Do not take baths, swim, or use a hot tub until your health care provider approves. Ask your health care provider if you may take showers. You may only be allowed to take sponge baths.  Do not drive for 24 hours if you were given a sedative during your procedure.  Wear a medical alert bracelet in case of an emergency. This will tell any health care providers that you have a port.  Keep all follow-up visits as told by your health care provider. This is important. Contact a health care provider if:  You cannot flush your port with saline as directed, or you cannot draw blood from the port.  You have a fever or chills.  You have redness, swelling, or pain around your port insertion site.  You have fluid or blood coming from your port insertion site.  Your port insertion site feels warm to the touch.  You have pus or a bad smell coming from the port insertion site. Get help right away if:  You have chest pain or shortness of breath.  You have bleeding from your port that you cannot control. Summary  Take care of the port as told by your health care provider. Keep the manufacturer's information card with you at all times.  Change your dressing as told by your health  care provider.  Contact a health care provider if you have a fever or chills or if you have redness, swelling, or pain around your port insertion site.  Keep all follow-up visits as told by your health care provider. This information is not intended to replace advice given to you by your health care provider. Make sure you discuss any questions you have with your health care provider. Document Released: 01/16/2013 Document Revised: 10/24/2017 Document Reviewed: 10/24/2017 Elsevier Patient Education  [place].

## 2019-04-01 ENCOUNTER — Other Ambulatory Visit: Payer: Self-pay

## 2019-04-01 ENCOUNTER — Ambulatory Visit (HOSPITAL_BASED_OUTPATIENT_CLINIC_OR_DEPARTMENT_OTHER)
Admission: RE | Admit: 2019-04-01 | Discharge: 2019-04-01 | Disposition: A | Discharge status: Home / Self Care | Payer: BC Managed Care – PPO | Source: Ambulatory Visit | Attending: Hematology | Admitting: Hematology

## 2019-04-01 DIAGNOSIS — C8118 Nodular sclerosis classical Hodgkin lymphoma, lymph nodes of multiple sites: Secondary | ICD-10-CM

## 2019-04-01 NOTE — Progress Notes (Signed)
  Echocardiogram 2D Echocardiogram has been performed.  Cardell Peach 04/01/2019, 2:32 PM

## 2019-04-02 ENCOUNTER — Encounter: Payer: Self-pay | Admitting: *Deleted

## 2019-04-02 ENCOUNTER — Other Ambulatory Visit: Payer: BC Managed Care – PPO

## 2019-04-02 ENCOUNTER — Inpatient Hospital Stay: Payer: BC Managed Care – PPO | Admitting: Nutrition

## 2019-04-02 NOTE — Progress Notes (Unsigned)
Patient in chemotherapy education class by himself .  Discussed side effects of  Adriamycin, Velban, Adcetris which include but are not limited to myelosuppression, decreased appetite, fatigue, fever, allergic or infusional reaction, mucositis, cardiac toxicity, cough, SOB, altered taste, nausea and vomiting, diarrhea, constipation, elevated LFTs myalgia and arthralgias, hair loss or thinning, rash, skin dryness, nail changes, peripheral neuropathy, discolored urine, delayed wound healing, mental changes (Chemo brain), increased risk of infections, weight loss.  Reviewed infusion room and office policy and procedure and phone numbers 24 hours x 7 days a week.    Reviewed when to call the office with any concerns or problems.  Scientist, clinical (histocompatibility and immunogenetics) given.  Discussed portacath insertion and EMLA cream administration.  Antiemetic protocol and chemotherapy schedule reviewed. Patient verbalized understanding of chemotherapy indications and possible side effects.  Teachback done

## 2019-04-02 NOTE — Progress Notes (Signed)
63 year old male diagnosed with Hodgkin's Lymphoma. He is a patient of Dr. Maylon Peppers. He is receiving a cycle is every 28 days:     Doxorubicin      Vinblastine      Dacarbazine      Brentuximab vedotin   PMH includes Pancreatitis, HTN, GERD, and DM.  Medications include Lipitor, Decadron, Ativan, Glucophage, Zofran and Compazine.  Labs include Glucose 113 on Dec 18.  Height: 5'6" Weight: 190 pounds.' UBW: 189 pounds in November. BMI:30.67.  Patient denies nutrition impact symptoms. Plans noted to start chemo on Dec 28. He is here for education.  Nutrition Diagnosis: Food and Nutrition Related Knowledge Deficit related to new diagnosis of Lymphoma.  Intervention: Educated patient to consume small, frequent meals and snacks throughout the day to maintain weight and lean body mass. Encouraged adequate protein to promote healing. Provided fact sheets on increasing calories and protein. Questions answered and teach back used. Provided contact information for questions.  Monitoring, Evaluation, Goals: Patient will consume adequate calories and protein for weight maintenance.  Next Visit: To be scheduled as needed. Patient has my contact information for questions or concerns.

## 2019-04-03 ENCOUNTER — Other Ambulatory Visit: Payer: Self-pay

## 2019-04-03 ENCOUNTER — Ambulatory Visit (HOSPITAL_COMMUNITY)
Admission: RE | Admit: 2019-04-03 | Discharge: 2019-04-03 | Disposition: A | Discharge status: Home / Self Care | Payer: BC Managed Care – PPO | Source: Ambulatory Visit | Attending: Hematology | Admitting: Hematology

## 2019-04-03 DIAGNOSIS — C8118 Nodular sclerosis classical Hodgkin lymphoma, lymph nodes of multiple sites: Secondary | ICD-10-CM | POA: Insufficient documentation

## 2019-04-03 LAB — GLUCOSE, CAPILLARY: Glucose-Capillary: 126 mg/dL — ABNORMAL HIGH (ref 70–99)

## 2019-04-03 MED ORDER — FLUDEOXYGLUCOSE F - 18 (FDG) INJECTION
9.3400 | Freq: Once | INTRAVENOUS | Status: AC | PRN
  Administered 2019-04-03: 9.34 via INTRAVENOUS

## 2019-04-08 ENCOUNTER — Inpatient Hospital Stay: Payer: BC Managed Care – PPO

## 2019-04-08 ENCOUNTER — Inpatient Hospital Stay (HOSPITAL_BASED_OUTPATIENT_CLINIC_OR_DEPARTMENT_OTHER): Payer: BC Managed Care – PPO | Admitting: Hematology

## 2019-04-08 ENCOUNTER — Encounter: Payer: Self-pay | Admitting: Hematology

## 2019-04-08 ENCOUNTER — Telehealth: Payer: Self-pay | Admitting: Hematology

## 2019-04-08 ENCOUNTER — Other Ambulatory Visit: Payer: BC Managed Care – PPO

## 2019-04-08 ENCOUNTER — Encounter: Payer: Self-pay | Admitting: *Deleted

## 2019-04-08 ENCOUNTER — Other Ambulatory Visit: Payer: Self-pay

## 2019-04-08 VITALS — BP 115/64 | HR 85 | Temp 97.8°F | Resp 18 | Ht 66.0 in | Wt 189.0 lb

## 2019-04-08 DIAGNOSIS — C8118 Nodular sclerosis classical Hodgkin lymphoma, lymph nodes of multiple sites: Secondary | ICD-10-CM

## 2019-04-08 DIAGNOSIS — D649 Anemia, unspecified: Secondary | ICD-10-CM | POA: Diagnosis not present

## 2019-04-08 LAB — CMP (CANCER CENTER ONLY)
ALT: 13 U/L (ref 0–44)
AST: 15 U/L (ref 15–41)
Albumin: 3.8 g/dL (ref 3.5–5.0)
Alkaline Phosphatase: 67 U/L (ref 38–126)
Anion gap: 8 (ref 5–15)
BUN: 19 mg/dL (ref 8–23)
CO2: 29 mmol/L (ref 22–32)
Calcium: 9.2 mg/dL (ref 8.9–10.3)
Chloride: 103 mmol/L (ref 98–111)
Creatinine: 1.01 mg/dL (ref 0.61–1.24)
GFR, Est AFR Am: >60 mL/min (ref >60)
GFR, Estimated: >60 mL/min (ref >60)
Glucose, Bld: 110 mg/dL — ABNORMAL HIGH (ref 70–99)
Potassium: 4 mmol/L (ref 3.5–5.1)
Sodium: 140 mmol/L (ref 135–145)
Total Bilirubin: 0.6 mg/dL (ref 0.3–1.2)
Total Protein: 6.8 g/dL (ref 6.5–8.1)

## 2019-04-08 LAB — CBC WITH DIFFERENTIAL (CANCER CENTER ONLY)
Abs Immature Granulocytes: 0.02 10*3/uL (ref 0.00–0.07)
Basophils Absolute: 0 10*3/uL (ref 0.0–0.1)
Basophils Relative: 0 %
Eosinophils Absolute: 0.2 10*3/uL (ref 0.0–0.5)
Eosinophils Relative: 2 %
HCT: 35.6 % — ABNORMAL LOW (ref 39.0–52.0)
Hemoglobin: 11.5 g/dL — ABNORMAL LOW (ref 13.0–17.0)
Immature Granulocytes: 0 %
Lymphocytes Relative: 14 %
Lymphs Abs: 1.1 10*3/uL (ref 0.7–4.0)
MCH: 27.6 pg (ref 26.0–34.0)
MCHC: 32.3 g/dL (ref 30.0–36.0)
MCV: 85.6 fL (ref 80.0–100.0)
Monocytes Absolute: 1.1 10*3/uL — ABNORMAL HIGH (ref 0.1–1.0)
Monocytes Relative: 13 %
Neutro Abs: 5.7 10*3/uL (ref 1.7–7.7)
Neutrophils Relative %: 71 %
Platelet Count: 199 10*3/uL (ref 150–400)
RBC: 4.16 MIL/uL — ABNORMAL LOW (ref 4.22–5.81)
RDW: 13.9 % (ref 11.5–15.5)
WBC Count: 8.1 10*3/uL (ref 4.0–10.5)
nRBC: 0 % (ref 0.0–0.2)

## 2019-04-08 MED ORDER — DIPHENHYDRAMINE HCL 50 MG/ML IJ SOLN
50.0000 mg | Freq: Once | INTRAMUSCULAR | Status: AC
  Administered 2019-04-08: 50 mg via INTRAVENOUS

## 2019-04-08 MED ORDER — SODIUM CHLORIDE 0.9% FLUSH
10.0000 mL | INTRAVENOUS | Status: DC | PRN
  Administered 2019-04-08: 10 mL
  Filled 2019-04-08: qty 10

## 2019-04-08 MED ORDER — DOXORUBICIN HCL CHEMO IV INJECTION 2 MG/ML
25.0000 mg/m2 | Freq: Once | INTRAVENOUS | Status: AC
  Administered 2019-04-08: 11:00:00 50 mg via INTRAVENOUS
  Filled 2019-04-08: qty 25

## 2019-04-08 MED ORDER — SODIUM CHLORIDE 0.9 % IV SOLN: 10.0000 mg | Freq: Once | INTRAVENOUS | Status: DC

## 2019-04-08 MED ORDER — SODIUM CHLORIDE 0.9 % IV SOLN
1.1500 mg/kg | Freq: Once | INTRAVENOUS | Status: AC
  Administered 2019-04-08: 100 mg via INTRAVENOUS
  Filled 2019-04-08: qty 20

## 2019-04-08 MED ORDER — PALONOSETRON HCL INJECTION 0.25 MG/5ML
0.2500 mg | Freq: Once | INTRAVENOUS | Status: AC
  Administered 2019-04-08: 0.25 mg via INTRAVENOUS

## 2019-04-08 MED ORDER — SODIUM CHLORIDE 0.9 % IV SOLN
375.0000 mg/m2 | Freq: Once | INTRAVENOUS | Status: AC
  Administered 2019-04-08: 750 mg via INTRAVENOUS
  Filled 2019-04-08: qty 75

## 2019-04-08 MED ORDER — DIPHENHYDRAMINE HCL 50 MG/ML IJ SOLN
INTRAMUSCULAR | Status: AC
  Filled 2019-04-08: qty 1

## 2019-04-08 MED ORDER — PEGFILGRASTIM 6 MG/0.6ML ~~LOC~~ PSKT
PREFILLED_SYRINGE | SUBCUTANEOUS | Status: AC
  Filled 2019-04-08: qty 0.6

## 2019-04-08 MED ORDER — HEPARIN SOD (PORK) LOCK FLUSH 100 UNIT/ML IV SOLN
500.0000 [IU] | Freq: Once | INTRAVENOUS | Status: AC | PRN
  Administered 2019-04-08: 500 [IU]
  Filled 2019-04-08: qty 5

## 2019-04-08 MED ORDER — PEGFILGRASTIM 6 MG/0.6ML ~~LOC~~ PSKT
6.0000 mg | PREFILLED_SYRINGE | Freq: Once | SUBCUTANEOUS | Status: AC
  Administered 2019-04-08: 6 mg via SUBCUTANEOUS

## 2019-04-08 MED ORDER — SODIUM CHLORIDE 0.9 % IV SOLN
Freq: Once | INTRAVENOUS | Status: AC
  Filled 2019-04-08: qty 250

## 2019-04-08 MED ORDER — ACETAMINOPHEN 325 MG PO TABS
650.0000 mg | ORAL_TABLET | Freq: Once | ORAL | Status: AC
  Administered 2019-04-08: 650 mg via ORAL

## 2019-04-08 MED ORDER — PALONOSETRON HCL INJECTION 0.25 MG/5ML
INTRAVENOUS | Status: AC
  Filled 2019-04-08: qty 5

## 2019-04-08 MED ORDER — ACETAMINOPHEN 325 MG PO TABS
ORAL_TABLET | ORAL | Status: AC
  Filled 2019-04-08: qty 2

## 2019-04-08 MED ORDER — VINBLASTINE SULFATE CHEMO INJECTION 1 MG/ML
6.0000 mg/m2 | Freq: Once | INTRAVENOUS | Status: AC
  Administered 2019-04-08: 12.1 mg via INTRAVENOUS
  Filled 2019-04-08: qty 12.1

## 2019-04-08 MED ORDER — SODIUM CHLORIDE 0.9 % IV SOLN
150.0000 mg | Freq: Once | INTRAVENOUS | Status: AC
  Administered 2019-04-08: 150 mg via INTRAVENOUS
  Filled 2019-04-08: qty 5

## 2019-04-08 NOTE — Progress Notes (Signed)
[place] OFFICE PROGRESS NOTE  Patient Care Team: Saguier, [name] as PCP - General (Internal Medicine) Tish Men, MD as Medical Oncologist (Oncology) Cordelia Poche, RN as Oncology Nurse Navigator  HEME/ONC OVERVIEW: 1. Classic Hodgkin lymphoma, nodular sclerosis subtype; at least Stage III -02/2019: enlarged R supraclavicular LN's (~4 and 3cm) on neck US   Enlarged cervical, bulky R supraclavicular, mediastinal, and RP adenopathy  Core R cervical LN bx non-diagnostic   Excisional LN bx showed classic Hodgkin lymphoma, nodular sclerosis subtype   FDG-avid LN's in neck, chest and abdomen; no bony abnormalities  -Late 03/2019 - present: AVD + Adcetris  TREATMENT SUMMARY:  04/08/2019 - present: AVD + Adcetris with Onpro   ASSESSMENT & PLAN:    Stage III Classic Hodgkin lymphoma, nodular sclerosis subtype -I independently reviewed the radiologic images of recent PET, and agree with findings documented.  In summary, PET showed FDG-avid LN's in the neck, chest, and abdomen, but there is no abnormal bony lesions. -I reviewed the imaging results in detail with the patient.  While bone marrow biopsy may be considered in some cases, he does not have any cytopenias other than mild anemia, and bone marrow biopsy would not alter the management.  Therefore, to avoid further delay, we will proceed with treatment as scheduled.  -I reinforced the rationale for chemotherapy, as well as some of potential side effects -Patient expressed understanding, and agreed to proceed with treatment as planned -Labs adequate today, proceed with C1D1 of chemotherapy -We will plan to repeat PET after 2 cycles of treatment to assess interim disease response -PRN anti-emetics: Zofran, Compazine, Ativan, and the dexamethasone  Normocytic anemia -Likely due to anemia of chronic disease -PET scan as above -Hgb 11.5 today, slightly lower than last visit -Patient denies any symptoms of  bleeding -We will monitor it for now  No orders of the defined types were placed in this encounter.  All questions were answered. The patient knows to call the clinic with any problems, questions or concerns. No barriers to learning was detected.  A total of more than 40 minutes were spent face-to-face with the patient during this encounter and over half of that time was spent on counseling and coordination of care as outlined above.   Return in 2 weeks for C1D15 of chemotherapy.   Tish Men, MD 04/08/2019 9:41 AM  CHIEF COMPLAINT: "I am doing fine"  INTERVAL HISTORY: Mr. Ehrler returns to clinic for follow-up of Stage III lymphoma.  Patient reports that the neck swelling has improved significantly with the antibiotics, and he denies any pain, erythema, or drainage from the recent neck excisional LN site. He otherwise feels well today, and denies any constitutional symptoms.  He met with a chemotherapy educator last week, and had extensive discussion regarding the benefits and side effects of chemotherapy.  REVIEW OF SYSTEMS:   Constitutional: ( - ) fevers, ( - )  chills , ( - ) night sweats Eyes: ( - ) blurriness of vision, ( - ) double vision, ( - ) watery eyes Ears, nose, mouth, throat, and face: ( - ) mucositis, ( - ) sore throat Respiratory: ( - ) cough, ( - ) dyspnea, ( - ) wheezes Cardiovascular: ( - ) palpitation, ( - ) chest discomfort, ( - ) lower extremity swelling Gastrointestinal:  ( - ) nausea, ( - ) heartburn, ( - ) change in bowel habits Skin: ( - ) abnormal skin rashes Lymphatics: ( + ) lymphadenopathy, ( - )  easy bruising Neurological: ( - ) numbness, ( - ) tingling, ( - ) new weaknesses Behavioral/Psych: ( - ) mood change, ( - ) new changes  All other systems were reviewed with the patient and are negative.  SUMMARY OF ONCOLOGIC HISTORY: Oncology History  Hodgkin lymphoma ([place])  02/25/2019 Initial Diagnosis   Hodgkin lymphoma ([place])   02/28/2019 Imaging    CT neck: IMPRESSION: Right paratracheal enlarged lymph node and right supraclavicular enlarged lymph nodes compatible with neoplasm. No pharyngeal mass in the neck. No other adenopathy in the neck. Tissue sampling is recommended.   02/28/2019 Imaging   CT CAP: IMPRESSION: 1. Bulky right supraclavicular and mediastinal adenopathy most compatible with metastatic disease. Additionally there is porta hepatic and retroperitoneal adenopathy within the abdomen concerning for metastatic disease. 2. Mild fibrotic changes involving the lungs bilaterally. 3. See dedicated neck CT report.   03/08/2019 Pathology Results   SURGICAL PATHOLOGY  CASE: [id number]  PATIENT: Tami [name]  Surgical Pathology Report      Clinical History: No known primary, now with right supraclavicular  lymphadenopathy, post US guided Bx. (cm)      FINAL MICROSCOPIC DIAGNOSIS:   A. LYMPH NODE, RIGHT SUPRACLAVICULAR, BIOPSY:  - Atypical lymphoid proliferation  - See comment   COMMENT:   The sections show needle core biopsy fragments of lymph nodal tissue  displaying predominance of small round to slightly irregular lymphocytes  admixed with a minor population of scattered large atypical mononuclear  and multilobated lymphoid appearing cells with variably prominent  nucleoli.  This is admixed with scattering of eosinophils in some areas.  Flow cytometric analysis was attempted but there was insufficient  material present in the sample ([place]).  Immunohistochemical  stains were performed including CD10, CD15, CD20, CD30, CD5, LCA, cyclin  D1, PAX 5, CD3 and EBV with appropriate controls.  LCA is diffusely  positive.  The small lymphoid cells show a mixture of T and B cells with  predominance of T cells.  No significant EBV, CD10 or cyclin D1  positivity is identified.  The larger atypical lymphoid cells appear to  be positive for CD30 and some for CD15 and PAX 5.  The overall findings  are  very limited but atypical and worrisome for a lymphoproliferative  process particularly Hodgkin lymphoma.  Excisional biopsy is recommended  including fresh tissue for lymphoma work-up in order to further evaluate  this process    03/20/2019 Pathology Results   FINAL MICROSCOPIC DIAGNOSIS:   A. LYMPH NODE, DEEP RIGHT CERVICAL POSTERIOR TRIANGLE, EXCISION:  -Classical Hodgkin lymphoma  -See comment   COMMENT:   Sections of the lymph node show effacement of the architecture by a  vaguely nodular lymphoproliferative process characterized by a  polymorphous cellular proliferation of small lymphocytes, eosinophils,  plasma cells in addition to variable numbers of large atypical  mononuclear and multi-lobated lymphoid appearing cells with variably  prominent nucleoli characteristic of Reed-Sternberg cells and variants  including lacunar cells.  Areas of early fibrosis surrounding some of  the nodules are also present.  Flow cytometric analysis was performed  ([id number]) and shows predominance of T lymphocytes with nonspecific  changes in addition to a minor polyclonal B-cell population with no  abnormal phenotype. Immunohistochemical stains were performed including  CD20, CD3, PAX 5, CD15, CD30, LCA and in situ hybridization for EBV with  appropriate controls.  The large atypical lymphoid appearing cells are  positive for CD15, CD30 and weakly for PAX 5 and negative for CD3, CD20,  LCA and EBV.  The small lymphocytes in the background show a mixture of  T and B-cells with predominance of T-cells. The morphologic and  immunophenotypic features are consistent with classical Hodgkin lymphoma  which is best subclassified as nodular sclerosis type.    04/03/2019 Imaging   PET: IMPRESSION: 1. Hypermetabolic lymphadenopathy involving the neck, chest and abdomen consistent with known Hodgkin's lymphoma. [place]au[place] 5. 2. No findings for osseous metastatic disease.   04/03/2019 Cancer Staging    Staging form: Hodgkin and Non-Hodgkin Lymphoma, AJCC 8th Edition - Clinical stage from 04/03/2019: Stage III (Hodgkin lymphoma) - Signed by Tish Men, MD on 04/08/2019   04/08/2019 -  Chemotherapy   The patient had DOXOrubicin (ADRIAMYCIN) chemo injection 50 mg, 25 mg/m2 = 50 mg, Intravenous,  Once, 1 of 6 cycles palonosetron (ALOXI) injection 0.25 mg, 0.25 mg, Intravenous,  Once, 1 of 6 cycles pegfilgrastim (NEULASTA ONPRO KIT) injection 6 mg, 6 mg, Subcutaneous, Once, 1 of 6 cycles dacarbazine (DTIC) 750 mg in sodium chloride 0.9 % 250 mL chemo infusion, 375 mg/m2 = 750 mg, Intravenous,  Once, 1 of 6 cycles fosaprepitant (EMEND) 150 mg in sodium chloride 0.9 % 145 mL IVPB, 150 mg, Intravenous,  Once, 1 of 6 cycles brentuximab vedotin (ADCETRIS) 105 mg in sodium chloride 0.9 % 100 mL chemo infusion, 1.2 mg/kg = 105 mg, Intravenous,  Once, 1 of 6 cycles vinBLAStine (VELBAN) 12.1 mg in sodium chloride 0.9 % 50 mL chemo infusion, 6 mg/m2 = 12.1 mg, Intravenous, Once, 1 of 6 cycles  for chemotherapy treatment.      I have reviewed the past medical history, past surgical history, social history and family history with the patient and they are unchanged from previous note.  ALLERGIES:  has No Known Allergies.  MEDICATIONS:  Current Outpatient Medications  Medication Sig Dispense Refill  . atorvastatin (LIPITOR) 10 MG tablet TAKE 1 TABLET BY MOUTH EVERY DAY 90 tablet 1  . dexamethasone (DECADRON) 4 MG tablet Take 2 tablets by mouth once a day for 3 days after chemo. Take with food. 30 tablet 1  . HYDROcodone-acetaminophen (NORCO) 5-325 MG tablet Take 1-2 tablets by mouth every 6 (six) hours as needed for moderate pain or severe pain. 20 tablet 0  . lidocaine-prilocaine (EMLA) cream Apply to affected area once 30 g 3  . LORazepam (ATIVAN) 0.5 MG tablet Take 1 tablet (0.5 mg total) by mouth every 6 (six) hours as needed (Nausea or vomiting). 30 tablet 0  . losartan (COZAAR) 100 MG tablet TAKE 1  TABLET BY MOUTH EVERY DAY 90 tablet 1  . metFORMIN (GLUCOPHAGE) 500 MG tablet TAKE 1 TABLET BY MOUTH TWICE A DAY WITH MEALS 180 tablet 1  . ondansetron (ZOFRAN) 8 MG tablet Take 1 tablet (8 mg total) by mouth 2 (two) times daily as needed. Start on the third day after chemotherapy. 30 tablet 1  . prochlorperazine (COMPAZINE) 10 MG tablet Take 1 tablet (10 mg total) by mouth every 6 (six) hours as needed (Nausea or vomiting). 30 tablet 1  . sildenafil (VIAGRA) 100 MG tablet Take 0.5-1 tablets (50-100 mg total) by mouth daily as needed for erectile dysfunction. 10 tablet 1   No current facility-administered medications for this visit.   Facility-Administered Medications Ordered in Other Visits  Medication Dose Route Frequency Provider Last Rate Last Admin  . brentuximab vedotin (ADCETRIS) 100 mg in sodium chloride 0.9 % 100 mL chemo infusion  1.15 mg/kg (Treatment Plan Recorded) Intravenous Once Tish Men, MD      .  dacarbazine (DTIC) 750 mg in sodium chloride 0.9 % 250 mL chemo infusion  375 mg/m2 (Treatment Plan Recorded) Intravenous Once Tish Men, MD      . DOXOrubicin (ADRIAMYCIN) chemo injection 50 mg  25 mg/m2 (Treatment Plan Recorded) Intravenous Once Tish Men, MD      . fosaprepitant (EMEND) 150 mg, dexamethasone (DECADRON) 10 mg in sodium chloride 0.9 % 145 mL IVPB  150 mg Intravenous Once Tish Men, MD      . heparin lock flush 100 unit/mL  500 Units Intracatheter Once PRN Tish Men, MD      . pegfilgrastim (NEULASTA ONPRO KIT) injection 6 mg  6 mg Subcutaneous Once Tish Men, MD      . sodium chloride flush (NS) 0.9 % injection 10 mL  10 mL Intracatheter PRN Tish Men, MD      . vinBLAStine (VELBAN) 12.1 mg in sodium chloride 0.9 % 50 mL chemo infusion  6 mg/m2 (Treatment Plan Recorded) Intravenous Once Tish Men, MD        PHYSICAL EXAMINATION: ECOG PERFORMANCE STATUS: 0 - Asymptomatic  Today's Vitals   04/08/19 0836  BP: 115/64  Pulse: 85  Resp: 18  Temp: 97.8 F (36.6 C)   TempSrc: Temporal  SpO2: 99%  Weight: 189 lb (85.7 kg)  Height: _0  (1.676 m)  PainSc: 0-No pain   Body mass index is 30.51 kg/m.  Filed Weights   04/08/19 0836  Weight: 189 lb (85.7 kg)    GENERAL: alert, no distress and comfortable SKIN: skin color, texture, turgor are normal, no rashes or significant lesions EYES: conjunctiva are pink and non-injected, sclera clear OROPHARYNX: no exudate, no erythema; lips, buccal mucosa, and tongue normal  NECK: supple, non-tender LYMPH:  mild right neck swelling, non-tender, incisional site well healed  LUNGS: clear to auscultation with normal breathing effort HEART: regular rate & rhythm and no murmurs and no lower extremity edema ABDOMEN: soft, non-tender, non-distended, normal bowel sounds Musculoskeletal: no cyanosis of digits and no clubbing  PSYCH: alert & oriented x 3, fluent speech  LABORATORY DATA:  I have reviewed the data as listed    Component Value Date/Time   NA 140 04/08/2019 0815   K 4.0 04/08/2019 0815   CL 103 04/08/2019 0815   CO2 29 04/08/2019 0815   GLUCOSE 110 (H) 04/08/2019 0815   BUN 19 04/08/2019 0815   CREATININE 1.01 04/08/2019 0815   CALCIUM 9.2 04/08/2019 0815   PROT 6.8 04/08/2019 0815   ALBUMIN 3.8 04/08/2019 0815   AST 15 04/08/2019 0815   ALT 13 04/08/2019 0815   ALKPHOS 67 04/08/2019 0815   BILITOT 0.6 04/08/2019 0815   GFRNONAA >60 04/08/2019 0815   GFRAA >60 04/08/2019 0815    No results found for: SPEP, UPEP  Lab Results  Component Value Date   WBC 8.1 04/08/2019   NEUTROABS 5.7 04/08/2019   HGB 11.5 (L) 04/08/2019   HCT 35.6 (L) 04/08/2019   MCV 85.6 04/08/2019   PLT 199 04/08/2019      Chemistry      Component Value Date/Time   NA 140 04/08/2019 0815   K 4.0 04/08/2019 0815   CL 103 04/08/2019 0815   CO2 29 04/08/2019 0815   BUN 19 04/08/2019 0815   CREATININE 1.01 04/08/2019 0815      Component Value Date/Time   CALCIUM 9.2 04/08/2019 0815   ALKPHOS 67 04/08/2019  0815   AST 15 04/08/2019 0815   ALT 13 04/08/2019 0815  BILITOT 0.6 04/08/2019 0815       RADIOGRAPHIC STUDIES: I have personally reviewed the radiological images as listed below and agreed with the findings in the report. PET, initial (skull base to thigh)  Result Date: 04/03/2019 CLINICAL DATA:  Initial treatment strategy for nodular sclerosing Hodgkin's lymphoma. EXAM: NUCLEAR MEDICINE PET SKULL BASE TO THIGH TECHNIQUE: 9.34 mCi F-18 FDG was injected intravenously. Full-ring PET imaging was performed from the skull base to thigh after the radiotracer. CT data was obtained and used for attenuation correction and anatomic localization. Fasting blood glucose: 126 mg/dl COMPARISON:  CT scan 02/28/2019 FINDINGS: Mediastinal blood pool activity: SUV max 2.56 Liver activity: SUV max 3.22 NECK: Multi station neck adenopathy as demonstrated on prior neck CT. Posterior cervical lymph node on the left has an SUV max of 10.02. Left-sided level 3 lymph node has an SUV max of 7.99. Large supraclavicular nodal mass on the right measures 22 mm and has an SUV max of 12.28. Areas of hypermetabolic brown fat are also noted in the supraclavicular fossa regions bilaterally. Incidental CT findings: none CHEST: 6 cm paratracheal nodal mass has an SUV max of 11.73. No axillary or subpectoral adenopathy. No hypermetabolic lung lesions are identified. Incidental CT findings: Interstitial lung disease, emphysema and scattered calcified granulomas are noted. ABDOMEN/PELVIS: No enlargement of the liver spleen. No hypermetabolism or focal lesions. Scattered mildly hypermetabolic retroperitoneal lymph nodes. 10 mm node posterior to the IVC has an SUV max of 3.31. 14 mm paraduodenal lymph node has an SUV max of 5.46. No obvious pelvic or inguinal adenopathy. Incidental CT findings: none SKELETON: No definite findings for osseous lymphoma. Incidental CT findings: none IMPRESSION: 1. Hypermetabolic lymphadenopathy involving the  neck, chest and abdomen consistent with known Hodgkin's lymphoma. Deauville 5. 2. No findings for osseous metastatic disease. Electronically Signed   By: Marijo Sanes M.D.   On: 04/03/2019 12:53   ECHOCARDIOGRAM COMPLETE  Result Date: 04/01/2019   ECHOCARDIOGRAM REPORT   Patient Name:   TIMOTHEE GALI [facility] Date of Exam: 04/01/2019 Medical Rec #:  [id number]         Height:       66.0 in Accession #:    [id number]        Weight:       190.0 lb Date of Birth:  [birth year]          BSA:          1.96 m Patient Age:    63 years          BP:           104/70 mmHg Patient Gender: M                 HR:           71 bpm. Exam Location:  [place] Procedure: 2D Echo, Cardiac Doppler, Color Doppler and Strain Analysis Indications:    Chemo  History:        Patient has prior history of Echocardiogram examinations, most                 recent 07/26/2011. Risk Factors:Diabetes and Hypertension.  Sonographer:    Cardell Peach RDCS (AE) Referring Phys: [id number] [place]  1. Left ventricular ejection fraction, by visual estimation, is 55 to 60%. The left ventricle has normal function. There is no left ventricular hypertrophy.  2. Left ventricular diastolic parameters are consistent with Grade I diastolic dysfunction (impaired relaxation).  3. 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 The left ventricle has normal function. The left ventricle is not well visualized. There is no left ventricular hypertrophy. Left ventricular diastolic parameters are consistent with Grade I diastolic dysfunction (impaired relaxation). Normal left atrial pressure. Right Ventricle: The right ventricular size is normal. No increase in right ventricular wall thickness. Global RV systolic function is has normal systolic function. Left Atrium: Left atrial size was normal in size. Right Atrium: Right atrial size  was normal in size Pericardium: There is no evidence of pericardial effusion. Mitral Valve: The mitral valve is normal in structure. No evidence of mitral valve regurgitation. No evidence of mitral valve stenosis by observation. Tricuspid Valve: The tricuspid valve is normal in structure. Tricuspid valve regurgitation is trivial. Aortic Valve: The aortic valve is normal in structure. Aortic valve regurgitation is not visualized. The aortic valve is structurally normal, with no evidence of sclerosis or stenosis. Pulmonic Valve: The pulmonic valve was normal in structure. Pulmonic valve regurgitation is not visualized. Pulmonic regurgitation is not visualized. Aorta: The aortic root, ascending aorta and aortic arch are all structurally normal, with no evidence of dilitation or obstruction. 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 Venous: The inferior vena cava is normal in size with greater than 50% respiratory variability, suggesting right atrial pressure of 3 mmHg. IAS/Shunts: No atrial level shunt detected by color flow Doppler. There is no evidence of a patent foramen ovale. No ventricular septal defect is seen or detected. There is no evidence of an atrial septal defect.  LEFT VENTRICLE PLAX 2D LVIDd:         4.55 cm  Diastology LVIDs:         2.73 cm  LV e' lateral:   8.49 cm/s LV PW:         1.17 cm  LV E/e' lateral: 7.4 LV IVS:        1.18 cm  LV e' medial:    8.70 cm/s LVOT diam:     2.10 cm  LV E/e' medial:  7.2 LV SV:         67 ml LV SV Index:   33.06 LVOT Area:     3.46 cm  RIGHT VENTRICLE             IVC RV Basal diam:  3.07 cm     IVC diam: 1.22 cm RV S prime:     20.00 cm/s TAPSE (M-mode): 2.9 cm LEFT ATRIUM             Index       RIGHT ATRIUM           Index LA diam:        3.30 cm 1.69 cm/m  RA Area:     12.80 cm LA Vol (A2C):   48.1 ml 24.58 ml/m RA Volume:   27.50 ml  14.05 ml/m LA Vol (A4C):   49.1 ml 25.09 ml/m LA Biplane Vol: 50.7 ml 25.91 ml/m  AORTIC  VALVE LVOT Vmax:   99.80 cm/s LVOT Vmean:  58.300 cm/s LVOT VTI:    0.182 m  AORTA Ao Root diam: 3.20 cm Ao Asc diam:  3.80 cm MITRAL VALVE MV Area (PHT): 2.43 cm             SHUNTS MV PHT:        90.48 msec  Systemic VTI:  0.18 m MV Decel Time: 312 msec             Systemic Diam: 2.10 cm MV E velocity: 62.60 cm/s 103 cm/s MV A velocity: 88.70 cm/s 70.3 cm/s MV E/A ratio:  0.71       1.5  Jyl Heinz MD Electronically signed by Jyl Heinz MD Signature Date/Time: 04/01/2019/5:58:21 PM    Final    IR port placement [facility])  Result Date: 03/29/2019 CLINICAL DATA:  Hodgkin's LYMPHOMA, ACCESS FOR CHEMOTHERAPY EXAM: LEFT INTERNAL JUGULAR SINGLE LUMEN POWER PORT CATHETER INSERTION Date:  03/29/2019 03/29/2019 10:14 am Radiologist:  M. Daryll Brod, MD Guidance:  Ultrasound fluoroscopic MEDICATIONS: Ancef 2 g; The antibiotic was administered within an appropriate time interval prior to skin puncture. ANESTHESIA/SEDATION: Versed 2.0 mg IV; Fentanyl 100 mcg IV; Moderate Sedation Time:  32 minutes The patient was continuously monitored during the procedure by the interventional radiology nurse under my direct supervision. FLUOROSCOPY TIME:  One minutes, 54 seconds (25 mGy) COMPLICATIONS: None immediate. CONTRAST:  None. PROCEDURE: Informed consent was obtained from the patient following explanation of the procedure, risks, benefits and alternatives. The patient understands, agrees and consents for the procedure. All questions were addressed. A time out was performed. Maximal barrier sterile technique utilized including caps, mask, sterile gowns, sterile gloves, large sterile drape, hand hygiene, and 2% chlorhexidine scrub. Under sterile conditions and local anesthesia, left internal jugular micropuncture venous access was performed. Access was performed with ultrasound. Images were obtained for documentation of the patent left internal jugular vein. A guide wire was inserted followed by a  transitional dilator. This allowed insertion of a guide wire and catheter into the IVC. Measurements were obtained from the SVC / RA junction back to the left IJ venotomy site. In the left infraclavicular chest, a subcutaneous pocket was created over the second anterior rib. This was done under sterile conditions and local anesthesia. 1% lidocaine with epinephrine was utilized for this. A 2.5 cm incision was made in the skin. Blunt dissection was performed to create a subcutaneous pocket over the right pectoralis major muscle. The pocket was flushed with saline vigorously. There was adequate hemostasis. The port catheter was assembled and checked for leakage. The port catheter was secured in the pocket with two retention sutures. The tubing was tunneled subcutaneously to the left venotomy site and inserted into the SVC/RA junction through a valved peel-away sheath. Position was confirmed with fluoroscopy. Images were obtained for documentation. The patient tolerated the procedure well. No immediate complications. Incisions were closed in a two layer fashion with 4 - 0 Vicryl suture. Dermabond was applied to the skin. The port catheter was accessed, blood was aspirated followed by saline and heparin flushes. Needle was removed. A dry sterile dressing was applied. IMPRESSION: Ultrasound and fluoroscopically guided left internal jugular single lumen power port catheter insertion. Tip in the SVC/RA junction. Catheter ready for use. Electronically Signed   By: Jerilynn Mages.  Shick M.D.   On: 03/29/2019 10:59

## 2019-04-08 NOTE — Patient Instructions (Signed)
[facility] Discharge Instructions for Patients Receiving Chemotherapy  Today you received the following chemotherapy agents Doxorubicin, Vinblastine, Dacarbazine and Brentuximab vedotin  To help prevent nausea and vomiting after your treatment, we encourage you to take your nausea medication as prescribed by MD.   If you develop nausea and vomiting that is not controlled by your nausea medication, call the clinic.   BELOW ARE SYMPTOMS THAT SHOULD BE REPORTED IMMEDIATELY:  *FEVER GREATER THAN 100.5 F  *CHILLS WITH OR WITHOUT FEVER  NAUSEA AND VOMITING THAT IS NOT CONTROLLED WITH YOUR NAUSEA MEDICATION  *UNUSUAL SHORTNESS OF BREATH  *UNUSUAL BRUISING OR BLEEDING  TENDERNESS IN MOUTH AND THROAT WITH OR WITHOUT PRESENCE OF ULCERS  *URINARY PROBLEMS  *BOWEL PROBLEMS  UNUSUAL RASH Items with * indicate a potential emergency and should be followed up as soon as possible.  Feel free to call the clinic should you have any questions or concerns. The clinic phone number is [phone].  Please show the [facility] at check-in to the Emergency Department and triage nurse.  Doxorubicin injection What is this medicine? DOXORUBICIN (dox oh ROO bi sin) is a chemotherapy drug. It is used to treat many kinds of cancer like leukemia, lymphoma, neuroblastoma, sarcoma, and Wilms' tumor. It is also used to treat bladder cancer, breast cancer, lung cancer, ovarian cancer, stomach cancer, and thyroid cancer. This medicine may be used for other purposes; ask your health care provider or pharmacist if you have questions. COMMON BRAND NAME(S): Adriamycin, Adriamycin PFS, Adriamycin RDF, Rubex What should I tell my health care provider before I take this medicine? They need to know if you have any of these conditions:  heart disease  history of low blood counts caused by a medicine  liver disease  recent or ongoing radiation therapy  an unusual or allergic reaction to  doxorubicin, other chemotherapy agents, other medicines, foods, dyes, or preservatives  pregnant or trying to get pregnant  breast-feeding How should I use this medicine? This drug is given as an infusion into a vein. It is administered in a hospital or clinic by a specially trained health care professional. If you have pain, swelling, burning or any unusual feeling around the site of your injection, tell your health care professional right away. Talk to your pediatrician regarding the use of this medicine in children. Special care may be needed. Overdosage: If you think you have taken too much of this medicine contact a poison control center or emergency room at once. NOTE: This medicine is only for you. Do not share this medicine with others. What if I miss a dose? It is important not to miss your dose. Call your doctor or health care professional if you are unable to keep an appointment. What may interact with this medicine? This medicine may interact with the following medications:  6-mercaptopurine  paclitaxel  phenytoin  St. John's Wort  trastuzumab  verapamil This list may not describe all possible interactions. Give your health care provider a list of all the medicines, herbs, non-prescription drugs, or dietary supplements you use. Also tell them if you smoke, drink alcohol, or use illegal drugs. Some items may interact with your medicine. What should I watch for while using this medicine? This drug may make you feel generally unwell. This is not uncommon, as chemotherapy can affect healthy cells as well as cancer cells. Report any side effects. Continue your course of treatment even though you feel ill unless your doctor tells you to stop.  There is a maximum amount of this medicine you should receive throughout your life. The amount depends on the medical condition being treated and your overall health. Your doctor will watch how much of this medicine you receive in your  lifetime. Tell your doctor if you have taken this medicine before. You may need blood work done while you are taking this medicine. Your urine may turn red for a few days after your dose. This is not blood. If your urine is dark or brown, call your doctor. In some cases, you may be given additional medicines to help with side effects. Follow all directions for their use. Call your doctor or health care professional for advice if you get a fever, chills or sore throat, or other symptoms of a cold or flu. Do not treat yourself. This drug decreases your body's ability to fight infections. Try to avoid being around people who are sick. This medicine may increase your risk to bruise or bleed. Call your doctor or health care professional if you notice any unusual bleeding. Talk to your doctor about your risk of cancer. You may be more at risk for certain types of cancers if you take this medicine. Do not become pregnant while taking this medicine or for 6 months after stopping it. Women should inform their doctor if they wish to become pregnant or think they might be pregnant. Men should not father a child while taking this medicine and for 6 months after stopping it. There is a potential for serious side effects to an unborn child. Talk to your health care professional or pharmacist for more information. Do not breast-feed an infant while taking this medicine. This medicine has caused ovarian failure in some women and reduced sperm counts in some men This medicine may interfere with the ability to have a child. Talk with your doctor or health care professional if you are concerned about your fertility. This medicine may cause a decrease in Co-Enzyme Q-10. You should make sure that you get enough Co-Enzyme Q-10 while you are taking this medicine. Discuss the foods you eat and the vitamins you take with your health care professional. What side effects may I notice from receiving this medicine? Side effects that  you should report to your doctor or health care professional as soon as possible:  allergic reactions like skin rash, itching or hives, swelling of the face, lips, or tongue  breathing problems  chest pain  fast or irregular heartbeat  low blood counts - this medicine may decrease the number of white blood cells, red blood cells and platelets. You may be at increased risk for infections and bleeding.  pain, redness, or irritation at site where injected  signs of infection - fever or chills, cough, sore throat, pain or difficulty passing urine  signs of decreased platelets or bleeding - bruising, pinpoint red spots on the skin, black, tarry stools, blood in the urine  swelling of the ankles, feet, hands  tiredness  weakness Side effects that usually do not require medical attention (report to your doctor or health care professional if they continue or are bothersome):  diarrhea  hair loss  mouth sores  nail discoloration or damage  nausea  red colored urine  vomiting This list may not describe all possible side effects. Call your doctor for medical advice about side effects. You may report side effects to FDA at 1-800-FDA-1088. Where should I keep my medicine? This drug is given in a hospital or clinic and will  not be stored at home. NOTE: This sheet is a summary. It may not cover all possible information. If you have questions about this medicine, talk to your doctor, pharmacist, or health care provider.  2020 Elsevier/Gold Standard (2016-11-09 11:01:26)   Vinblastine injection What is this medicine? VINBLASTINE (vin BLAS teen) is a chemotherapy drug. It slows the growth of cancer cells. This medicine is used to treat many types of cancer like breast cancer, testicular cancer, Hodgkin's disease, non-Hodgkin's lymphoma, and sarcoma. This medicine may be used for other purposes; ask your health care provider or pharmacist if you have questions. COMMON BRAND NAME(S):  Velban What should I tell my health care provider before I take this medicine? They need to know if you have any of these conditions:  blood disorders  dental disease  gout  infection (especially a virus infection such as chickenpox, cold sores, or herpes)  liver disease  lung disease  nervous system disease  recent or ongoing radiation therapy  an unusual or allergic reaction to vinblastine, other chemotherapy agents, other medicines, foods, dyes, or preservatives  pregnant or trying to get pregnant  breast-feeding How should I use this medicine? This drug is given as an infusion into a vein. It is administered in a hospital or clinic by a specially trained health care professional. If you have pain, swelling, burning or any unusual feeling around the site of your injection, tell your health care professional right away. Talk to your pediatrician regarding the use of this medicine in children. While this drug may be prescribed for selected conditions, precautions do apply. Overdosage: If you think you have taken too much of this medicine contact a poison control center or emergency room at once. NOTE: This medicine is only for you. Do not share this medicine with others. What if I miss a dose? It is important not to miss your dose. Call your doctor or health care professional if you are unable to keep an appointment. What may interact with this medicine? Do not take this medicine with any of the following medications:  erythromycin  itraconazole  mibefradil  voriconazole This medicine may also interact with the following medications:  cyclosporine  fluconazole  ketoconazole  medicines for seizures like phenytoin  medicines to increase blood counts like filgrastim, pegfilgrastim, sargramostim  vaccines  verapamil Talk to your doctor or health care professional before taking any of these  medicines:  acetaminophen  aspirin  ibuprofen  ketoprofen  naproxen This list may not describe all possible interactions. Give your health care provider a list of all the medicines, herbs, non-prescription drugs, or dietary supplements you use. Also tell them if you smoke, drink alcohol, or use illegal drugs. Some items may interact with your medicine. What should I watch for while using this medicine? Your condition will be monitored carefully while you are receiving this medicine. You will need important blood work done while you are taking this medicine. This drug may make you feel generally unwell. This is not uncommon, as chemotherapy can affect healthy cells as well as cancer cells. Report any side effects. Continue your course of treatment even though you feel ill unless your doctor tells you to stop. In some cases, you may be given additional medicines to help with side effects. Follow all directions for their use. Call your doctor or health care professional for advice if you get a fever, chills or sore throat, or other symptoms of a cold or flu. Do not treat yourself. This drug decreases  your body's ability to fight infections. Try to avoid being around people who are sick. This medicine may increase your risk to bruise or bleed. Call your doctor or health care professional if you notice any unusual bleeding. Be careful brushing and flossing your teeth or using a toothpick because you may get an infection or bleed more easily. If you have any dental work done, tell your dentist you are receiving this medicine. Avoid taking products that contain aspirin, acetaminophen, ibuprofen, naproxen, or ketoprofen unless instructed by your doctor. These medicines may hide a fever. Do not become pregnant while taking this medicine. Women should inform their doctor if they wish to become pregnant or think they might be pregnant. There is a potential for serious side effects to an unborn child. Talk  to your health care professional or pharmacist for more information. Do not breast-feed an infant while taking this medicine. Men may have a lower sperm count while taking this medicine. Talk to your doctor if you plan to father a child. What side effects may I notice from receiving this medicine? Side effects that you should report to your doctor or health care professional as soon as possible:  allergic reactions like skin rash, itching or hives, swelling of the face, lips, or tongue  low blood counts - This drug may decrease the number of white blood cells, red blood cells and platelets. You may be at increased risk for infections and bleeding.  signs of infection - fever or chills, cough, sore throat, pain or difficulty passing urine  signs of decreased platelets or bleeding - bruising, pinpoint red spots on the skin, black, tarry stools, nosebleeds  signs of decreased red blood cells - unusually weak or tired, fainting spells, lightheadedness  breathing problems  changes in hearing  change in the amount of urine  chest pain  high blood pressure  mouth sores  nausea and vomiting  pain, swelling, redness or irritation at the injection site  pain, tingling, numbness in the hands or feet  problems with balance, dizziness  seizures Side effects that usually do not require medical attention (report to your doctor or health care professional if they continue or are bothersome):  constipation  hair loss  jaw pain  loss of appetite  sensitivity to light  stomach pain  tumor pain This list may not describe all possible side effects. Call your doctor for medical advice about side effects. You may report side effects to FDA at 1-800-FDA-1088. Where should I keep my medicine? This drug is given in a hospital or clinic and will not be stored at home. NOTE: This sheet is a summary. It may not cover all possible information. If you have questions about this medicine, talk  to your doctor, pharmacist, or health care provider.  2020 Elsevier/Gold Standard (2007-12-24 17:15:59)   Dacarbazine, DTIC injection What is this medicine? DACARBAZINE (da KAR ba zeen) is a chemotherapy drug. This medicine is used to treat skin cancer. It is also used with other medicines to treat Hodgkin's disease. This medicine may be used for other purposes; ask your health care provider or pharmacist if you have questions. COMMON BRAND NAME(S): DTIC-Dome What should I tell my health care provider before I take this medicine? They need to know if you have any of these conditions:  infection (especially virus infection such as chickenpox, cold sores, or herpes)  kidney disease  liver disease  low blood counts like low platelets, red blood cells, white blood cells  recent  radiation therapy  an unusual or allergic reaction to dacarbazine, other chemotherapy agents, other medicines, foods, dyes, or preservatives  pregnant or trying to get pregnant  breast-feeding How should I use this medicine? This drug is given as an injection or infusion into a vein. It is administered in a hospital or clinic by a specially trained health care professional. Talk to your pediatrician regarding the use of this medicine in children. While this drug may be prescribed for selected conditions, precautions do apply. Overdosage: If you think you have taken too much of this medicine contact a poison control center or emergency room at once. NOTE: This medicine is only for you. Do not share this medicine with others. What if I miss a dose? It is important not to miss your dose. Call your doctor or health care professional if you are unable to keep an appointment. What may interact with this medicine?  medicines to increase blood counts like filgrastim, pegfilgrastim, sargramostim  vaccines This list may not describe all possible interactions. Give your health care provider a list of all the  medicines, herbs, non-prescription drugs, or dietary supplements you use. Also tell them if you smoke, drink alcohol, or use illegal drugs. Some items may interact with your medicine. What should I watch for while using this medicine? Your condition will be monitored carefully while you are receiving this medicine. You will need important blood work done while you are taking this medicine. This drug may make you feel generally unwell. This is not uncommon, as chemotherapy can affect healthy cells as well as cancer cells. Report any side effects. Continue your course of treatment even though you feel ill unless your doctor tells you to stop. Call your doctor or health care professional for advice if you get a fever, chills or sore throat, or other symptoms of a cold or flu. Do not treat yourself. This drug decreases your body's ability to fight infections. Try to avoid being around people who are sick. This medicine may increase your risk to bruise or bleed. Call your doctor or health care professional if you notice any unusual bleeding. Talk to your doctor about your risk of cancer. You may be more at risk for certain types of cancers if you take this medicine. Do not become pregnant while taking this medicine. Women should inform their doctor if they wish to become pregnant or think they might be pregnant. There is a potential for serious side effects to an unborn child. Talk to your health care professional or pharmacist for more information. Do not breast-feed an infant while taking this medicine. What side effects may I notice from receiving this medicine? Side effects that you should report to your doctor or health care professional as soon as possible:  allergic reactions like skin rash, itching or hives, swelling of the face, lips, or tongue  low blood counts - this medicine may decrease the number of white blood cells, red blood cells and platelets. You may be at increased risk for infections  and bleeding.  signs of infection - fever or chills, cough, sore throat, pain or difficulty passing urine  signs of decreased platelets or bleeding - bruising, pinpoint red spots on the skin, black, tarry stools, blood in the urine  signs of decreased red blood cells - unusually weak or tired, fainting spells, lightheadedness  breathing problems  muscle pains  pain at site where injected  trouble passing urine or change in the amount of urine  vomiting  yellowing  of the eyes or skin Side effects that usually do not require medical attention (report to your doctor or health care professional if they continue or are bothersome):  diarrhea  hair loss  loss of appetite  nausea  skin more sensitive to sun or ultraviolet light  stomach upset This list may not describe all possible side effects. Call your doctor for medical advice about side effects. You may report side effects to FDA at 1-800-FDA-1088. Where should I keep my medicine? This drug is given in a hospital or clinic and will not be stored at home. NOTE: This sheet is a summary. It may not cover all possible information. If you have questions about this medicine, talk to your doctor, pharmacist, or health care provider.  2020 Elsevier/Gold Standard (2015-05-29 15:17:39)   Brentuximab vedotin solution for injection What is this medicine? BRENTUXIMAB VEDOTIN (bren TUX see mab ve DOE tin) is a monoclonal antibody and a chemotherapy drug. It is used for treating Hodgkin lymphoma and certain non-Hodgkin lymphomas, such as anaplastic large-cell lymphoma, mycosis fungoides, and peripheral T-cell lymphoma. This medicine may be used for other purposes; ask your health care provider or pharmacist if you have questions. COMMON BRAND NAME(S): ADCETRIS What should I tell my health care provider before I take this medicine? They need to know if you have any of these conditions:  immune system problems  infection (especially a  virus infection such as chickenpox, cold sores, or herpes)  kidney disease  liver disease  low blood counts, like low white cell, platelet, or red cell counts  tingling of the fingers or toes, or other nerve disorder  an unusual or allergic reaction to brentuximab vedotin, other medicines, foods, dyes, or preservatives  pregnant or trying to get pregnant  breast-feeding How should I use this medicine? This medicine is for infusion into a vein. It is given by a health care professional in a hospital or clinic setting. Talk to your pediatrician regarding the use of this medicine in children. Special care may be needed. Overdosage: If you think you have taken too much of this medicine contact a poison control center or emergency room at once. NOTE: This medicine is only for you. Do not share this medicine with others. What if I miss a dose? It is important not to miss your dose. Call your doctor or health care professional if you are unable to keep an appointment. What may interact with this medicine? This medicine may interact with the following medications:  ketoconazole  rifampin  St. John's wort; Hypericum perforatum This list may not describe all possible interactions. Give your health care provider a list of all the medicines, herbs, non-prescription drugs, or dietary supplements you use. Also tell them if you smoke, drink alcohol, or use illegal drugs. Some items may interact with your medicine. What should I watch for while using this medicine? Visit your doctor for checks on your progress. This drug may make you feel generally unwell. Report any side effects. Continue your course of treatment even though you feel ill unless your doctor tells you to stop. Call your doctor or health care professional for advice if you get a fever, chills or sore throat, or other symptoms of a cold or flu. Do not treat yourself. This drug decreases your body's ability to fight infections. Try to  avoid being around people who are sick. This medicine may increase your risk to bruise or bleed. Call your doctor or health care professional if you notice any  unusual bleeding. In some patients, this medicine may cause a serious brain infection that may cause death. If you have any problems seeing, thinking, speaking, walking, or standing, tell your doctor right away. If you cannot reach your doctor, urgently seek other source of medical care. Do not become pregnant while taking this medicine or for 6 months after stopping it. Women should inform their doctor if they wish to become pregnant or think they might be pregnant. Men should not father a child while taking this medicine and for 6 months after stopping it. There is a potential for serious side effects to an unborn child. Talk to your health care professional or pharmacist for more information. Do not breast-feed an infant while taking this medicine. This may interfere with the ability to father a child. You should talk to your doctor or health care professional if you are concerned about your fertility. What side effects may I notice from receiving this medicine? Side effects that you should report to your doctor or health care professional as soon as possible:  allergic reactions like skin rash, itching or hives, swelling of the face, lips, or tongue  changes in emotions or moods  diarrhea  low blood counts - this medicine may decrease the number of white blood cells, red blood cells and platelets. You may be at increased risk for infections and bleeding.  pain, tingling, numbness in the hands or feet  redness, blistering, peeling or loosening of the skin, including inside the mouth  shortness of breath  signs of infection - fever or chills, cough, sore throat, pain or difficulty passing urine  signs of decreased platelets or bleeding - bruising, pinpoint red spots on the skin, black, tarry stools, blood in the urine  signs of  decreased red blood cells - unusually weak or tired, fainting spells, lightheadedness  signs of liver injury like dark yellow or brown urine; general ill feeling or flu-like symptoms; light-colored stools; loss of appetite; nausea; right upper belly pain; yellowing of the eyes or skin  stomach pain  sudden numbness or weakness of the face, arm or leg  vomiting Side effects that usually do not require medical attention (report to your doctor or health care professional if they continue or are bothersome):  constipation  dizziness  headache  muscle pain  tiredness This list may not describe all possible side effects. Call your doctor for medical advice about side effects. You may report side effects to FDA at 1-800-FDA-1088. Where should I keep my medicine? This drug is given in a hospital or clinic and will not be stored at home. NOTE: This sheet is a summary. It may not cover all possible information. If you have questions about this medicine, talk to your doctor, pharmacist, or health care provider.  2020 Elsevier/Gold Standard (2017-02-27 14:10:02)

## 2019-04-08 NOTE — Telephone Encounter (Signed)
No change in appts per 12/28 los 

## 2019-04-08 NOTE — Progress Notes (Signed)
Patient here for first treatment. He has completed all diagnostic workup and chemo education. He has his prn medications.   At this time he has no questions or concerns. Reviewed how to reach the office at any time. He knows to reach out if he has any questions or concerns.

## 2019-04-08 NOTE — Progress Notes (Signed)
PA states Fulphila, but okay for OnPro today per Otilio Carpen, Estate manager/land agent.

## 2019-04-14 ENCOUNTER — Other Ambulatory Visit: Payer: Self-pay | Admitting: Medical

## 2019-04-15 ENCOUNTER — Other Ambulatory Visit: Payer: Self-pay | Admitting: Family

## 2019-04-16 ENCOUNTER — Other Ambulatory Visit: Payer: Self-pay | Admitting: Hematology

## 2019-04-18 ENCOUNTER — Telehealth: Payer: Self-pay | Admitting: *Deleted

## 2019-04-18 NOTE — Telephone Encounter (Signed)
Received call from patient. He states his next appt for chemo infusion is on Monday, 04/22/19. He states he has developed a cough but no other symptoms. Denies fever, chills etc. Non-productive cough. He is asking if it is ok for him to come in for treatment on 04/22/19. He states he has not been around anyone with known covid 19 and states he has been wearing his mask when outside his home.  Advised that it will ok ok to come on Monday but if develops any other symptoms to call back.  Provided # to call for covid testing should he need it.  Pt voiced understanding.

## 2019-04-19 ENCOUNTER — Other Ambulatory Visit: Payer: Self-pay | Admitting: Hematology

## 2019-04-19 ENCOUNTER — Other Ambulatory Visit: Payer: Self-pay | Admitting: *Deleted

## 2019-04-19 ENCOUNTER — Telehealth: Payer: Self-pay | Admitting: *Deleted

## 2019-04-19 DIAGNOSIS — C8118 Nodular sclerosis classical Hodgkin lymphoma, lymph nodes of multiple sites: Secondary | ICD-10-CM

## 2019-04-19 DIAGNOSIS — R05 Cough: Secondary | ICD-10-CM

## 2019-04-19 DIAGNOSIS — R059 Cough, unspecified: Secondary | ICD-10-CM

## 2019-04-19 MED ORDER — GUAIFENESIN-DM 100-10 MG/5ML PO SYRP: 5.0000 mL | ORAL_SOLUTION | ORAL | 0 refills | Status: DC | PRN

## 2019-04-19 NOTE — Telephone Encounter (Signed)
Thanks. We will reach out to him and see what's going on.  Dr. Maylon Peppers

## 2019-04-19 NOTE — Telephone Encounter (Signed)
This nurse called and spoke to patient regarding cough. He stated,"the cough starts with movement, if I'm sitting still, there is no cough. It is a nonproductive cough, no fevers, no chills, no night sweats. Its a dry hacking cough." Per Dr. Maylon Peppers, I called in a prescription for Robitussin DM to the [facility] on [facility]. Also, when he comes in on Monday for lab/MD/treatment, Dr. Maylon Peppers would like for you to get a Chest Xray. He verbalized understanding.

## 2019-04-20 ENCOUNTER — Other Ambulatory Visit: Payer: Self-pay | Admitting: Hematology

## 2019-04-20 DIAGNOSIS — C8118 Nodular sclerosis classical Hodgkin lymphoma, lymph nodes of multiple sites: Secondary | ICD-10-CM

## 2019-04-22 ENCOUNTER — Inpatient Hospital Stay: Payer: BC Managed Care – PPO

## 2019-04-22 ENCOUNTER — Other Ambulatory Visit: Payer: Self-pay

## 2019-04-22 ENCOUNTER — Telehealth: Payer: Self-pay | Admitting: Hematology

## 2019-04-22 ENCOUNTER — Inpatient Hospital Stay: Payer: BC Managed Care – PPO | Attending: Hematology

## 2019-04-22 ENCOUNTER — Encounter: Payer: Self-pay | Admitting: Hematology

## 2019-04-22 ENCOUNTER — Inpatient Hospital Stay (HOSPITAL_BASED_OUTPATIENT_CLINIC_OR_DEPARTMENT_OTHER): Payer: BC Managed Care – PPO | Admitting: Hematology

## 2019-04-22 ENCOUNTER — Ambulatory Visit (HOSPITAL_BASED_OUTPATIENT_CLINIC_OR_DEPARTMENT_OTHER)
Admission: RE | Admit: 2019-04-22 | Discharge: 2019-04-22 | Disposition: A | Discharge status: Home / Self Care | Payer: BC Managed Care – PPO | Source: Ambulatory Visit | Attending: Hematology | Admitting: Hematology

## 2019-04-22 ENCOUNTER — Encounter (HOSPITAL_BASED_OUTPATIENT_CLINIC_OR_DEPARTMENT_OTHER): Payer: Self-pay

## 2019-04-22 VITALS — BP 107/61 | HR 75 | Temp 98.9°F | Resp 18 | Ht 66.0 in | Wt 183.4 lb

## 2019-04-22 VITALS — BP 107/61 | HR 76 | Resp 17

## 2019-04-22 DIAGNOSIS — C8118 Nodular sclerosis classical Hodgkin lymphoma, lymph nodes of multiple sites: Secondary | ICD-10-CM

## 2019-04-22 DIAGNOSIS — D72829 Elevated white blood cell count, unspecified: Secondary | ICD-10-CM | POA: Diagnosis not present

## 2019-04-22 DIAGNOSIS — Z7689 Persons encountering health services in other specified circumstances: Secondary | ICD-10-CM | POA: Diagnosis not present

## 2019-04-22 DIAGNOSIS — T451X5A Adverse effect of antineoplastic and immunosuppressive drugs, initial encounter: Secondary | ICD-10-CM | POA: Insufficient documentation

## 2019-04-22 DIAGNOSIS — Z79899 Other long term (current) drug therapy: Secondary | ICD-10-CM | POA: Diagnosis not present

## 2019-04-22 DIAGNOSIS — Z95828 Presence of other vascular implants and grafts: Secondary | ICD-10-CM

## 2019-04-22 DIAGNOSIS — K521 Toxic gastroenteritis and colitis: Secondary | ICD-10-CM | POA: Insufficient documentation

## 2019-04-22 DIAGNOSIS — D6481 Anemia due to antineoplastic chemotherapy: Secondary | ICD-10-CM | POA: Insufficient documentation

## 2019-04-22 DIAGNOSIS — R05 Cough: Secondary | ICD-10-CM

## 2019-04-22 DIAGNOSIS — Z5112 Encounter for antineoplastic immunotherapy: Secondary | ICD-10-CM | POA: Diagnosis not present

## 2019-04-22 DIAGNOSIS — G620 Drug-induced polyneuropathy: Secondary | ICD-10-CM | POA: Insufficient documentation

## 2019-04-22 DIAGNOSIS — Z7984 Long term (current) use of oral hypoglycemic drugs: Secondary | ICD-10-CM | POA: Insufficient documentation

## 2019-04-22 DIAGNOSIS — R197 Diarrhea, unspecified: Secondary | ICD-10-CM | POA: Insufficient documentation

## 2019-04-22 DIAGNOSIS — Z5111 Encounter for antineoplastic chemotherapy: Secondary | ICD-10-CM | POA: Insufficient documentation

## 2019-04-22 DIAGNOSIS — Z7952 Long term (current) use of systemic steroids: Secondary | ICD-10-CM | POA: Insufficient documentation

## 2019-04-22 DIAGNOSIS — D638 Anemia in other chronic diseases classified elsewhere: Secondary | ICD-10-CM | POA: Insufficient documentation

## 2019-04-22 DIAGNOSIS — R059 Cough, unspecified: Secondary | ICD-10-CM

## 2019-04-22 LAB — CMP (CANCER CENTER ONLY)
ALT: 24 U/L (ref 0–44)
AST: 17 U/L (ref 15–41)
Albumin: 3.4 g/dL — ABNORMAL LOW (ref 3.5–5.0)
Alkaline Phosphatase: 56 U/L (ref 38–126)
Anion gap: 8 (ref 5–15)
BUN: 11 mg/dL (ref 8–23)
CO2: 29 mmol/L (ref 22–32)
Calcium: 8.8 mg/dL — ABNORMAL LOW (ref 8.9–10.3)
Chloride: 102 mmol/L (ref 98–111)
Creatinine: 0.93 mg/dL (ref 0.61–1.24)
GFR, Est AFR Am: >60 mL/min (ref >60)
GFR, Estimated: >60 mL/min (ref >60)
Glucose, Bld: 150 mg/dL — ABNORMAL HIGH (ref 70–99)
Potassium: 4 mmol/L (ref 3.5–5.1)
Sodium: 139 mmol/L (ref 135–145)
Total Bilirubin: 0.5 mg/dL (ref 0.3–1.2)
Total Protein: 6.2 g/dL — ABNORMAL LOW (ref 6.5–8.1)

## 2019-04-22 LAB — CBC WITH DIFFERENTIAL (CANCER CENTER ONLY)
Abs Immature Granulocytes: 0.54 10*3/uL — ABNORMAL HIGH (ref 0.00–0.07)
Basophils Absolute: 0.1 10*3/uL (ref 0.0–0.1)
Basophils Relative: 1 %
Eosinophils Absolute: 0.1 10*3/uL (ref 0.0–0.5)
Eosinophils Relative: 1 %
HCT: 34.8 % — ABNORMAL LOW (ref 39.0–52.0)
Hemoglobin: 11.5 g/dL — ABNORMAL LOW (ref 13.0–17.0)
Immature Granulocytes: 4 %
Lymphocytes Relative: 13 %
Lymphs Abs: 1.7 10*3/uL (ref 0.7–4.0)
MCH: 27.8 pg (ref 26.0–34.0)
MCHC: 33 g/dL (ref 30.0–36.0)
MCV: 84.1 fL (ref 80.0–100.0)
Monocytes Absolute: 0.8 10*3/uL (ref 0.1–1.0)
Monocytes Relative: 6 %
Neutro Abs: 9.9 10*3/uL — ABNORMAL HIGH (ref 1.7–7.7)
Neutrophils Relative %: 75 %
Platelet Count: 245 10*3/uL (ref 150–400)
RBC: 4.14 MIL/uL — ABNORMAL LOW (ref 4.22–5.81)
RDW: 13.6 % (ref 11.5–15.5)
WBC Count: 13 10*3/uL — ABNORMAL HIGH (ref 4.0–10.5)
nRBC: 0 % (ref 0.0–0.2)

## 2019-04-22 MED ORDER — DEXAMETHASONE SODIUM PHOSPHATE 10 MG/ML IJ SOLN
10.0000 mg | Freq: Once | INTRAMUSCULAR | Status: AC
  Administered 2019-04-22: 10 mg via INTRAVENOUS

## 2019-04-22 MED ORDER — PALONOSETRON HCL INJECTION 0.25 MG/5ML
INTRAVENOUS | Status: AC
  Filled 2019-04-22: qty 5

## 2019-04-22 MED ORDER — DOXORUBICIN HCL CHEMO IV INJECTION 2 MG/ML
25.0000 mg/m2 | Freq: Once | INTRAVENOUS | Status: AC
  Administered 2019-04-22: 50 mg via INTRAVENOUS
  Filled 2019-04-22: qty 25

## 2019-04-22 MED ORDER — ACETAMINOPHEN 325 MG PO TABS
ORAL_TABLET | ORAL | Status: AC
  Filled 2019-04-22: qty 2

## 2019-04-22 MED ORDER — DEXAMETHASONE SODIUM PHOSPHATE 10 MG/ML IJ SOLN
INTRAMUSCULAR | Status: AC
  Filled 2019-04-22: qty 1

## 2019-04-22 MED ORDER — HEPARIN SOD (PORK) LOCK FLUSH 100 UNIT/ML IV SOLN
500.0000 [IU] | Freq: Once | INTRAVENOUS | Status: DC
  Filled 2019-04-22: qty 5

## 2019-04-22 MED ORDER — PALONOSETRON HCL INJECTION 0.25 MG/5ML
0.2500 mg | Freq: Once | INTRAVENOUS | Status: AC
  Administered 2019-04-22: 0.25 mg via INTRAVENOUS

## 2019-04-22 MED ORDER — SODIUM CHLORIDE 0.9% FLUSH
10.0000 mL | INTRAVENOUS | Status: DC | PRN
  Administered 2019-04-22: 15:00:00 10 mL
  Filled 2019-04-22: qty 10

## 2019-04-22 MED ORDER — PEGFILGRASTIM 6 MG/0.6ML ~~LOC~~ PSKT
6.0000 mg | PREFILLED_SYRINGE | Freq: Once | SUBCUTANEOUS | Status: AC
  Administered 2019-04-22: 6 mg via SUBCUTANEOUS

## 2019-04-22 MED ORDER — ACETAMINOPHEN 325 MG PO TABS
ORAL_TABLET | ORAL | Status: AC
  Filled 2019-04-22: qty 1

## 2019-04-22 MED ORDER — SODIUM CHLORIDE 0.9 % IV SOLN
1.1500 mg/kg | Freq: Once | INTRAVENOUS | Status: AC
  Administered 2019-04-22: 100 mg via INTRAVENOUS
  Filled 2019-04-22: qty 20

## 2019-04-22 MED ORDER — ALTEPLASE 2 MG IJ SOLR
INTRAMUSCULAR | Status: AC
  Filled 2019-04-22: qty 2

## 2019-04-22 MED ORDER — SODIUM CHLORIDE 0.9% FLUSH
10.0000 mL | INTRAVENOUS | Status: DC | PRN
  Administered 2019-04-22: 10 mL via INTRAVENOUS
  Filled 2019-04-22: qty 10

## 2019-04-22 MED ORDER — VINBLASTINE SULFATE CHEMO INJECTION 1 MG/ML
12.0000 mg | Freq: Once | INTRAVENOUS | Status: AC
  Administered 2019-04-22: 13:00:00 12 mg via INTRAVENOUS
  Filled 2019-04-22: qty 12

## 2019-04-22 MED ORDER — SODIUM CHLORIDE 0.9 % IV SOLN
375.0000 mg/m2 | Freq: Once | INTRAVENOUS | Status: AC
  Administered 2019-04-22: 750 mg via INTRAVENOUS
  Filled 2019-04-22: qty 60

## 2019-04-22 MED ORDER — DIPHENHYDRAMINE HCL 50 MG/ML IJ SOLN
50.0000 mg | Freq: Once | INTRAMUSCULAR | Status: AC
  Administered 2019-04-22: 10:00:00 50 mg via INTRAVENOUS

## 2019-04-22 MED ORDER — ALTEPLASE 2 MG IJ SOLR
2.0000 mg | Freq: Once | INTRAMUSCULAR | Status: AC | PRN
  Administered 2019-04-22: 2 mg
  Filled 2019-04-22: qty 2

## 2019-04-22 MED ORDER — PEGFILGRASTIM 6 MG/0.6ML ~~LOC~~ PSKT
PREFILLED_SYRINGE | SUBCUTANEOUS | Status: AC
  Filled 2019-04-22: qty 0.6

## 2019-04-22 MED ORDER — STERILE WATER FOR INJECTION IJ SOLN
INTRAMUSCULAR | Status: AC
  Filled 2019-04-22: qty 10

## 2019-04-22 MED ORDER — ACETAMINOPHEN 325 MG PO TABS
650.0000 mg | ORAL_TABLET | Freq: Once | ORAL | Status: AC
  Administered 2019-04-22: 10:00:00 650 mg via ORAL

## 2019-04-22 MED ORDER — SODIUM CHLORIDE 0.9 % IV SOLN
Freq: Once | INTRAVENOUS | Status: AC
  Filled 2019-04-22: qty 250

## 2019-04-22 MED ORDER — DIPHENHYDRAMINE HCL 50 MG/ML IJ SOLN
INTRAMUSCULAR | Status: AC
  Filled 2019-04-22: qty 1

## 2019-04-22 MED ORDER — HEPARIN SOD (PORK) LOCK FLUSH 100 UNIT/ML IV SOLN
500.0000 [IU] | Freq: Once | INTRAVENOUS | Status: AC | PRN
  Administered 2019-04-22: 15:00:00 500 [IU]
  Filled 2019-04-22: qty 5

## 2019-04-22 MED ORDER — SODIUM CHLORIDE 0.9 % IV SOLN
150.0000 mg | Freq: Once | INTRAVENOUS | Status: AC
  Administered 2019-04-22: 150 mg via INTRAVENOUS
  Filled 2019-04-22: qty 150

## 2019-04-22 NOTE — Telephone Encounter (Signed)
Appointments scheduled patient will get updates from infusion/ 1/11 los

## 2019-04-22 NOTE — Patient Instructions (Signed)
[facility] Discharge Instructions for Patients Receiving Chemotherapy  Today you received the following chemotherapy agents Doxorubicin, Vinblastine, Dacarbazine and Brentuximab vedotin  To help prevent nausea and vomiting after your treatment, we encourage you to take your nausea medication as prescribed by MD.   If you develop nausea and vomiting that is not controlled by your nausea medication, call the clinic.   BELOW ARE SYMPTOMS THAT SHOULD BE REPORTED IMMEDIATELY:  *FEVER GREATER THAN 100.5 F  *CHILLS WITH OR WITHOUT FEVER  NAUSEA AND VOMITING THAT IS NOT CONTROLLED WITH YOUR NAUSEA MEDICATION  *UNUSUAL SHORTNESS OF BREATH  *UNUSUAL BRUISING OR BLEEDING  TENDERNESS IN MOUTH AND THROAT WITH OR WITHOUT PRESENCE OF ULCERS  *URINARY PROBLEMS  *BOWEL PROBLEMS  UNUSUAL RASH Items with * indicate a potential emergency and should be followed up as soon as possible.  Feel free to call the clinic should you have any questions or concerns. The clinic phone number is [phone].  Please show the [place] at check-in to the Emergency Department and triage nurse.  Doxorubicin injection What is this medicine? DOXORUBICIN (dox oh ROO bi sin) is a chemotherapy drug. It is used to treat many kinds of cancer like leukemia, lymphoma, neuroblastoma, sarcoma, and Wilms' tumor. It is also used to treat bladder cancer, breast cancer, lung cancer, ovarian cancer, stomach cancer, and thyroid cancer. This medicine may be used for other purposes; ask your health care provider or pharmacist if you have questions. COMMON BRAND NAME(S): Adriamycin, Adriamycin PFS, Adriamycin RDF, Rubex What should I tell my health care provider before I take this medicine? They need to know if you have any of these conditions:  heart disease  history of low blood counts caused by a medicine  liver disease  recent or ongoing radiation therapy  an unusual or allergic reaction to  doxorubicin, other chemotherapy agents, other medicines, foods, dyes, or preservatives  pregnant or trying to get pregnant  breast-feeding How should I use this medicine? This drug is given as an infusion into a vein. It is administered in a hospital or clinic by a specially trained health care professional. If you have pain, swelling, burning or any unusual feeling around the site of your injection, tell your health care professional right away. Talk to your pediatrician regarding the use of this medicine in children. Special care may be needed. Overdosage: If you think you have taken too much of this medicine contact a poison control center or emergency room at once. NOTE: This medicine is only for you. Do not share this medicine with others. What if I miss a dose? It is important not to miss your dose. Call your doctor or health care professional if you are unable to keep an appointment. What may interact with this medicine? This medicine may interact with the following medications:  6-mercaptopurine  paclitaxel  phenytoin  St. John's Wort  trastuzumab  verapamil This list may not describe all possible interactions. Give your health care provider a list of all the medicines, herbs, non-prescription drugs, or dietary supplements you use. Also tell them if you smoke, drink alcohol, or use illegal drugs. Some items may interact with your medicine. What should I watch for while using this medicine? This drug may make you feel generally unwell. This is not uncommon, as chemotherapy can affect healthy cells as well as cancer cells. Report any side effects. Continue your course of treatment even though you feel ill unless your doctor tells you to stop.  There is a maximum amount of this medicine you should receive throughout your life. The amount depends on the medical condition being treated and your overall health. Your doctor will watch how much of this medicine you receive in your  lifetime. Tell your doctor if you have taken this medicine before. You may need blood work done while you are taking this medicine. Your urine may turn red for a few days after your dose. This is not blood. If your urine is dark or brown, call your doctor. In some cases, you may be given additional medicines to help with side effects. Follow all directions for their use. Call your doctor or health care professional for advice if you get a fever, chills or sore throat, or other symptoms of a cold or flu. Do not treat yourself. This drug decreases your body's ability to fight infections. Try to avoid being around people who are sick. This medicine may increase your risk to bruise or bleed. Call your doctor or health care professional if you notice any unusual bleeding. Talk to your doctor about your risk of cancer. You may be more at risk for certain types of cancers if you take this medicine. Do not become pregnant while taking this medicine or for 6 months after stopping it. Women should inform their doctor if they wish to become pregnant or think they might be pregnant. Men should not father a child while taking this medicine and for 6 months after stopping it. There is a potential for serious side effects to an unborn child. Talk to your health care professional or pharmacist for more information. Do not breast-feed an infant while taking this medicine. This medicine has caused ovarian failure in some women and reduced sperm counts in some men This medicine may interfere with the ability to have a child. Talk with your doctor or health care professional if you are concerned about your fertility. This medicine may cause a decrease in Co-Enzyme Q-10. You should make sure that you get enough Co-Enzyme Q-10 while you are taking this medicine. Discuss the foods you eat and the vitamins you take with your health care professional. What side effects may I notice from receiving this medicine? Side effects that  you should report to your doctor or health care professional as soon as possible:  allergic reactions like skin rash, itching or hives, swelling of the face, lips, or tongue  breathing problems  chest pain  fast or irregular heartbeat  low blood counts - this medicine may decrease the number of white blood cells, red blood cells and platelets. You may be at increased risk for infections and bleeding.  pain, redness, or irritation at site where injected  signs of infection - fever or chills, cough, sore throat, pain or difficulty passing urine  signs of decreased platelets or bleeding - bruising, pinpoint red spots on the skin, black, tarry stools, blood in the urine  swelling of the ankles, feet, hands  tiredness  weakness Side effects that usually do not require medical attention (report to your doctor or health care professional if they continue or are bothersome):  diarrhea  hair loss  mouth sores  nail discoloration or damage  nausea  red colored urine  vomiting This list may not describe all possible side effects. Call your doctor for medical advice about side effects. You may report side effects to FDA at 1-800-FDA-1088. Where should I keep my medicine? This drug is given in a hospital or clinic and will  not be stored at home. NOTE: This sheet is a summary. It may not cover all possible information. If you have questions about this medicine, talk to your doctor, pharmacist, or health care provider.  2020 Elsevier/Gold Standard (2016-11-09 11:01:26)   Vinblastine injection What is this medicine? VINBLASTINE (vin BLAS teen) is a chemotherapy drug. It slows the growth of cancer cells. This medicine is used to treat many types of cancer like breast cancer, testicular cancer, Hodgkin's disease, non-Hodgkin's lymphoma, and sarcoma. This medicine may be used for other purposes; ask your health care provider or pharmacist if you have questions. COMMON BRAND NAME(S):  Velban What should I tell my health care provider before I take this medicine? They need to know if you have any of these conditions:  blood disorders  dental disease  gout  infection (especially a virus infection such as chickenpox, cold sores, or herpes)  liver disease  lung disease  nervous system disease  recent or ongoing radiation therapy  an unusual or allergic reaction to vinblastine, other chemotherapy agents, other medicines, foods, dyes, or preservatives  pregnant or trying to get pregnant  breast-feeding How should I use this medicine? This drug is given as an infusion into a vein. It is administered in a hospital or clinic by a specially trained health care professional. If you have pain, swelling, burning or any unusual feeling around the site of your injection, tell your health care professional right away. Talk to your pediatrician regarding the use of this medicine in children. While this drug may be prescribed for selected conditions, precautions do apply. Overdosage: If you think you have taken too much of this medicine contact a poison control center or emergency room at once. NOTE: This medicine is only for you. Do not share this medicine with others. What if I miss a dose? It is important not to miss your dose. Call your doctor or health care professional if you are unable to keep an appointment. What may interact with this medicine? Do not take this medicine with any of the following medications:  erythromycin  itraconazole  mibefradil  voriconazole This medicine may also interact with the following medications:  cyclosporine  fluconazole  ketoconazole  medicines for seizures like phenytoin  medicines to increase blood counts like filgrastim, pegfilgrastim, sargramostim  vaccines  verapamil Talk to your doctor or health care professional before taking any of these  medicines:  acetaminophen  aspirin  ibuprofen  ketoprofen  naproxen This list may not describe all possible interactions. Give your health care provider a list of all the medicines, herbs, non-prescription drugs, or dietary supplements you use. Also tell them if you smoke, drink alcohol, or use illegal drugs. Some items may interact with your medicine. What should I watch for while using this medicine? Your condition will be monitored carefully while you are receiving this medicine. You will need important blood work done while you are taking this medicine. This drug may make you feel generally unwell. This is not uncommon, as chemotherapy can affect healthy cells as well as cancer cells. Report any side effects. Continue your course of treatment even though you feel ill unless your doctor tells you to stop. In some cases, you may be given additional medicines to help with side effects. Follow all directions for their use. Call your doctor or health care professional for advice if you get a fever, chills or sore throat, or other symptoms of a cold or flu. Do not treat yourself. This drug decreases  your body's ability to fight infections. Try to avoid being around people who are sick. This medicine may increase your risk to bruise or bleed. Call your doctor or health care professional if you notice any unusual bleeding. Be careful brushing and flossing your teeth or using a toothpick because you may get an infection or bleed more easily. If you have any dental work done, tell your dentist you are receiving this medicine. Avoid taking products that contain aspirin, acetaminophen, ibuprofen, naproxen, or ketoprofen unless instructed by your doctor. These medicines may hide a fever. Do not become pregnant while taking this medicine. Women should inform their doctor if they wish to become pregnant or think they might be pregnant. There is a potential for serious side effects to an unborn child. Talk  to your health care professional or pharmacist for more information. Do not breast-feed an infant while taking this medicine. Men may have a lower sperm count while taking this medicine. Talk to your doctor if you plan to father a child. What side effects may I notice from receiving this medicine? Side effects that you should report to your doctor or health care professional as soon as possible:  allergic reactions like skin rash, itching or hives, swelling of the face, lips, or tongue  low blood counts - This drug may decrease the number of white blood cells, red blood cells and platelets. You may be at increased risk for infections and bleeding.  signs of infection - fever or chills, cough, sore throat, pain or difficulty passing urine  signs of decreased platelets or bleeding - bruising, pinpoint red spots on the skin, black, tarry stools, nosebleeds  signs of decreased red blood cells - unusually weak or tired, fainting spells, lightheadedness  breathing problems  changes in hearing  change in the amount of urine  chest pain  high blood pressure  mouth sores  nausea and vomiting  pain, swelling, redness or irritation at the injection site  pain, tingling, numbness in the hands or feet  problems with balance, dizziness  seizures Side effects that usually do not require medical attention (report to your doctor or health care professional if they continue or are bothersome):  constipation  hair loss  jaw pain  loss of appetite  sensitivity to light  stomach pain  tumor pain This list may not describe all possible side effects. Call your doctor for medical advice about side effects. You may report side effects to FDA at 1-800-FDA-1088. Where should I keep my medicine? This drug is given in a hospital or clinic and will not be stored at home. NOTE: This sheet is a summary. It may not cover all possible information. If you have questions about this medicine, talk  to your doctor, pharmacist, or health care provider.  2020 Elsevier/Gold Standard (2007-12-24 17:15:59)   Dacarbazine, DTIC injection What is this medicine? DACARBAZINE (da KAR ba zeen) is a chemotherapy drug. This medicine is used to treat skin cancer. It is also used with other medicines to treat Hodgkin's disease. This medicine may be used for other purposes; ask your health care provider or pharmacist if you have questions. COMMON BRAND NAME(S): DTIC-Dome What should I tell my health care provider before I take this medicine? They need to know if you have any of these conditions:  infection (especially virus infection such as chickenpox, cold sores, or herpes)  kidney disease  liver disease  low blood counts like low platelets, red blood cells, white blood cells  recent  radiation therapy  an unusual or allergic reaction to dacarbazine, other chemotherapy agents, other medicines, foods, dyes, or preservatives  pregnant or trying to get pregnant  breast-feeding How should I use this medicine? This drug is given as an injection or infusion into a vein. It is administered in a hospital or clinic by a specially trained health care professional. Talk to your pediatrician regarding the use of this medicine in children. While this drug may be prescribed for selected conditions, precautions do apply. Overdosage: If you think you have taken too much of this medicine contact a poison control center or emergency room at once. NOTE: This medicine is only for you. Do not share this medicine with others. What if I miss a dose? It is important not to miss your dose. Call your doctor or health care professional if you are unable to keep an appointment. What may interact with this medicine?  medicines to increase blood counts like filgrastim, pegfilgrastim, sargramostim  vaccines This list may not describe all possible interactions. Give your health care provider a list of all the  medicines, herbs, non-prescription drugs, or dietary supplements you use. Also tell them if you smoke, drink alcohol, or use illegal drugs. Some items may interact with your medicine. What should I watch for while using this medicine? Your condition will be monitored carefully while you are receiving this medicine. You will need important blood work done while you are taking this medicine. This drug may make you feel generally unwell. This is not uncommon, as chemotherapy can affect healthy cells as well as cancer cells. Report any side effects. Continue your course of treatment even though you feel ill unless your doctor tells you to stop. Call your doctor or health care professional for advice if you get a fever, chills or sore throat, or other symptoms of a cold or flu. Do not treat yourself. This drug decreases your body's ability to fight infections. Try to avoid being around people who are sick. This medicine may increase your risk to bruise or bleed. Call your doctor or health care professional if you notice any unusual bleeding. Talk to your doctor about your risk of cancer. You may be more at risk for certain types of cancers if you take this medicine. Do not become pregnant while taking this medicine. Women should inform their doctor if they wish to become pregnant or think they might be pregnant. There is a potential for serious side effects to an unborn child. Talk to your health care professional or pharmacist for more information. Do not breast-feed an infant while taking this medicine. What side effects may I notice from receiving this medicine? Side effects that you should report to your doctor or health care professional as soon as possible:  allergic reactions like skin rash, itching or hives, swelling of the face, lips, or tongue  low blood counts - this medicine may decrease the number of white blood cells, red blood cells and platelets. You may be at increased risk for infections  and bleeding.  signs of infection - fever or chills, cough, sore throat, pain or difficulty passing urine  signs of decreased platelets or bleeding - bruising, pinpoint red spots on the skin, black, tarry stools, blood in the urine  signs of decreased red blood cells - unusually weak or tired, fainting spells, lightheadedness  breathing problems  muscle pains  pain at site where injected  trouble passing urine or change in the amount of urine  vomiting  yellowing  of the eyes or skin Side effects that usually do not require medical attention (report to your doctor or health care professional if they continue or are bothersome):  diarrhea  hair loss  loss of appetite  nausea  skin more sensitive to sun or ultraviolet light  stomach upset This list may not describe all possible side effects. Call your doctor for medical advice about side effects. You may report side effects to FDA at 1-800-FDA-1088. Where should I keep my medicine? This drug is given in a hospital or clinic and will not be stored at home. NOTE: This sheet is a summary. It may not cover all possible information. If you have questions about this medicine, talk to your doctor, pharmacist, or health care provider.  2020 Elsevier/Gold Standard (2015-05-29 15:17:39)   Brentuximab vedotin solution for injection What is this medicine? BRENTUXIMAB VEDOTIN (bren TUX see mab ve DOE tin) is a monoclonal antibody and a chemotherapy drug. It is used for treating Hodgkin lymphoma and certain non-Hodgkin lymphomas, such as anaplastic large-cell lymphoma, mycosis fungoides, and peripheral T-cell lymphoma. This medicine may be used for other purposes; ask your health care provider or pharmacist if you have questions. COMMON BRAND NAME(S): ADCETRIS What should I tell my health care provider before I take this medicine? They need to know if you have any of these conditions:  immune system problems  infection (especially a  virus infection such as chickenpox, cold sores, or herpes)  kidney disease  liver disease  low blood counts, like low white cell, platelet, or red cell counts  tingling of the fingers or toes, or other nerve disorder  an unusual or allergic reaction to brentuximab vedotin, other medicines, foods, dyes, or preservatives  pregnant or trying to get pregnant  breast-feeding How should I use this medicine? This medicine is for infusion into a vein. It is given by a health care professional in a hospital or clinic setting. Talk to your pediatrician regarding the use of this medicine in children. Special care may be needed. Overdosage: If you think you have taken too much of this medicine contact a poison control center or emergency room at once. NOTE: This medicine is only for you. Do not share this medicine with others. What if I miss a dose? It is important not to miss your dose. Call your doctor or health care professional if you are unable to keep an appointment. What may interact with this medicine? This medicine may interact with the following medications:  ketoconazole  rifampin  St. John's wort; Hypericum perforatum This list may not describe all possible interactions. Give your health care provider a list of all the medicines, herbs, non-prescription drugs, or dietary supplements you use. Also tell them if you smoke, drink alcohol, or use illegal drugs. Some items may interact with your medicine. What should I watch for while using this medicine? Visit your doctor for checks on your progress. This drug may make you feel generally unwell. Report any side effects. Continue your course of treatment even though you feel ill unless your doctor tells you to stop. Call your doctor or health care professional for advice if you get a fever, chills or sore throat, or other symptoms of a cold or flu. Do not treat yourself. This drug decreases your body's ability to fight infections. Try to  avoid being around people who are sick. This medicine may increase your risk to bruise or bleed. Call your doctor or health care professional if you notice any  unusual bleeding. In some patients, this medicine may cause a serious brain infection that may cause death. If you have any problems seeing, thinking, speaking, walking, or standing, tell your doctor right away. If you cannot reach your doctor, urgently seek other source of medical care. Do not become pregnant while taking this medicine or for 6 months after stopping it. Women should inform their doctor if they wish to become pregnant or think they might be pregnant. Men should not father a child while taking this medicine and for 6 months after stopping it. There is a potential for serious side effects to an unborn child. Talk to your health care professional or pharmacist for more information. Do not breast-feed an infant while taking this medicine. This may interfere with the ability to father a child. You should talk to your doctor or health care professional if you are concerned about your fertility. What side effects may I notice from receiving this medicine? Side effects that you should report to your doctor or health care professional as soon as possible:  allergic reactions like skin rash, itching or hives, swelling of the face, lips, or tongue  changes in emotions or moods  diarrhea  low blood counts - this medicine may decrease the number of white blood cells, red blood cells and platelets. You may be at increased risk for infections and bleeding.  pain, tingling, numbness in the hands or feet  redness, blistering, peeling or loosening of the skin, including inside the mouth  shortness of breath  signs of infection - fever or chills, cough, sore throat, pain or difficulty passing urine  signs of decreased platelets or bleeding - bruising, pinpoint red spots on the skin, black, tarry stools, blood in the urine  signs of  decreased red blood cells - unusually weak or tired, fainting spells, lightheadedness  signs of liver injury like dark yellow or brown urine; general ill feeling or flu-like symptoms; light-colored stools; loss of appetite; nausea; right upper belly pain; yellowing of the eyes or skin  stomach pain  sudden numbness or weakness of the face, arm or leg  vomiting Side effects that usually do not require medical attention (report to your doctor or health care professional if they continue or are bothersome):  constipation  dizziness  headache  muscle pain  tiredness This list may not describe all possible side effects. Call your doctor for medical advice about side effects. You may report side effects to FDA at 1-800-FDA-1088. Where should I keep my medicine? This drug is given in a hospital or clinic and will not be stored at home. NOTE: This sheet is a summary. It may not cover all possible information. If you have questions about this medicine, talk to your doctor, pharmacist, or health care provider.  2020 Elsevier/Gold Standard (2017-02-27 14:10:02)

## 2019-04-22 NOTE — Progress Notes (Signed)
[facility] OFFICE PROGRESS NOTE  Patient Care Team: Saguier, [name] as PCP - General (Internal Medicine) Tish Men, MD as Medical Oncologist (Oncology) Cordelia Poche, RN as Oncology Nurse Navigator  HEME/ONC OVERVIEW: 1. Classic Hodgkin lymphoma, nodular sclerosis subtype; at least Stage III -02/2019: enlarged R supraclavicular LN's (~4 and 3cm) on neck US   Enlarged cervical, bulky R supraclavicular, mediastinal, and RP adenopathy  Core R cervical LN bx non-diagnostic   Excisional LN bx showed classic Hodgkin lymphoma, nodular sclerosis subtype   FDG-avid LN's in neck, chest and abdomen; no bony abnormalities  -Late 03/2019 - present: AVD + Adcetris  TREATMENT SUMMARY:  04/08/2019 - present: AVD + Adcetris with Onpro   ASSESSMENT & PLAN:    Stage III Classic Hodgkin lymphoma, nodular sclerosis subtype -S/p C1D1 of AVD + Adcetris -Patient tolerated the treatment relatively well, except a dry cough (see below)  -Labs adequate today, proceed with C1D15 of chemotherapy -We will plan to repeat PET after 2 cycles of treatment to assess interim disease response -PRN anti-emetics: Zofran, Compazine, Ativan, and the dexamethasone  Chemotherapy-associated anemia -Secondary to chemotherapy, as well as anemia of chronic disease  -Hgb 11.5, stable -Patient denies any symptom of bleeding -We will monitor for now; no indication for dose adjustment  Leukocytosis -Likely secondary to Onpro -WBC 13.0k, new -Patient denies any recent infection, except a dry cough -I have ordered CXR today to rule out any infection -I also counseled the patient on some of the symptoms of infection, including fever (T > 100.4), dyspnea, chest pain, sputum production, for which he is instructed to contact the clinic for further instruction  -We will monitor it for now  Dry cough -Etiology unclear, possible reactive airway disease vs medications (losartan) -No other associated  symptoms, such as fever, chest pain, dyspnea, or sputum production -I have ordered chest x-ray to rule out any infection -If patient develops any other associated symptoms, he is instructed to contact the clinic ASAP for further evaluation -Continue OTC Robitussin and cough drops PRN  Chemotherapy-associated diarrhea -Secondary to chemotherapy -Currently well controlled with Imodium; no abdominal pain, hematochezia or melena  -Continue PRN Imodium  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  All questions were answered. The patient knows to call the clinic with any problems, questions or concerns. No barriers to learning was detected.  Return in 2 weeks for C2D1 of chemotherapy.  Tish Men, MD 1/11/20219:04 AM  CHIEF COMPLAINT: "I just have this dry cough"  INTERVAL HISTORY: Mr. Sheahan returns to clinic for follow-up of Hodgkin lymphoma on chemotherapy.  Patient reports that starting a week ago, he developed a new onset dry cough, triggered by movement, resolves with rest, not associated with any sputum production, chest pain, shortness of breath, or fever.  He takes OTC Robitussin and cough drops with improvement in the cough.  He also reports mild, intermittent, numbness in the fingertips that started last Friday, occurring 1-2 times per day, lasting a few minutes at a time, not interfering with ADLs.  He also reports that he had 2 episodes of diarrhea after the first dose of chemotherapy, which resolved with OTC Imodium.  He denies any recurrent diarrhea since then.  He denies any other complaint today.  REVIEW OF  SYSTEMS:   Constitutional: ( - ) fevers, ( - )  chills , ( - ) night sweats Eyes: ( - ) blurriness of vision, ( - ) double  vision, ( - ) watery eyes Ears, nose, mouth, throat, and face: ( - ) mucositis, ( - ) sore throat Respiratory: ( + ) cough, ( - ) dyspnea, ( - ) wheezes Cardiovascular: ( - ) palpitation, ( - ) chest discomfort, ( - ) lower extremity swelling Gastrointestinal:  ( - ) nausea, ( - ) heartburn, ( + ) change in bowel habits Skin: ( - ) abnormal skin rashes Lymphatics: ( - ) new lymphadenopathy, ( - ) easy bruising Neurological: ( + ) numbness, ( - ) tingling, ( - ) new weaknesses Behavioral/Psych: ( - ) mood change, ( - ) new changes  All other systems were reviewed with the patient and are negative.  SUMMARY OF ONCOLOGIC HISTORY: Oncology History  Hodgkin lymphoma ([place])  02/25/2019 Initial Diagnosis   Hodgkin lymphoma ([place])   02/28/2019 Imaging   CT neck: IMPRESSION: Right paratracheal enlarged lymph node and right supraclavicular enlarged lymph nodes compatible with neoplasm. No pharyngeal mass in the neck. No other adenopathy in the neck. Tissue sampling is recommended.   02/28/2019 Imaging   CT CAP: IMPRESSION: 1. Bulky right supraclavicular and mediastinal adenopathy most compatible with metastatic disease. Additionally there is porta hepatic and retroperitoneal adenopathy within the abdomen concerning for metastatic disease. 2. Mild fibrotic changes involving the lungs bilaterally. 3. See dedicated neck CT report.   03/08/2019 Pathology Results   SURGICAL PATHOLOGY  CASE: [id number]  PATIENT: Tami [name]  Surgical Pathology Report      Clinical History: No known primary, now with right supraclavicular  lymphadenopathy, post US guided Bx. (cm)      FINAL MICROSCOPIC DIAGNOSIS:   A. LYMPH NODE, RIGHT SUPRACLAVICULAR, BIOPSY:  - Atypical lymphoid proliferation  - See comment   COMMENT:   The sections show needle core biopsy fragments of lymph nodal tissue  displaying predominance of small round to slightly irregular lymphocytes  admixed with a minor  population of scattered large atypical mononuclear  and multilobated lymphoid appearing cells with variably prominent  nucleoli.  This is admixed with scattering of eosinophils in some areas.  Flow cytometric analysis was attempted but there was insufficient  material present in the sample ([place]).  Immunohistochemical  stains were performed including CD10, CD15, CD20, CD30, CD5, LCA, cyclin  D1, PAX 5, CD3 and EBV with appropriate controls.  LCA is diffusely  positive.  The small lymphoid cells show a mixture of T and B cells with  predominance of T cells.  No significant EBV, CD10 or cyclin D1  positivity is identified.  The larger atypical lymphoid cells appear to  be positive for CD30 and some for CD15 and PAX 5.  The overall findings  are very limited but atypical and worrisome for a lymphoproliferative  process particularly Hodgkin lymphoma.  Excisional biopsy is recommended  including fresh tissue for lymphoma work-up in order to further evaluate  this process    03/20/2019 Pathology Results   FINAL MICROSCOPIC DIAGNOSIS:   A. LYMPH NODE, DEEP RIGHT CERVICAL POSTERIOR TRIANGLE, EXCISION:  -Classical Hodgkin lymphoma  -See comment   COMMENT:   Sections of the lymph node show effacement of the architecture by a  vaguely nodular lymphoproliferative process characterized by a  polymorphous cellular proliferation of small lymphocytes, eosinophils,  plasma cells in addition to variable numbers of large atypical  mononuclear and multi-lobated lymphoid appearing cells with variably  prominent nucleoli characteristic of Reed-Sternberg cells and variants  including lacunar cells.  Areas of early fibrosis surrounding some of  the nodules are also present.  Flow cytometric analysis was performed  ([id number]) and shows predominance of T lymphocytes with nonspecific  changes in addition to a minor polyclonal B-cell population with no  abnormal phenotype. Immunohistochemical stains  were performed including  CD20, CD3, PAX 5, CD15, CD30, LCA and in situ hybridization for EBV with  appropriate controls.  The large atypical lymphoid appearing cells are  positive for CD15, CD30 and weakly for PAX 5 and negative for CD3, CD20,  LCA and EBV.  The small lymphocytes in the background show a mixture of  T and B-cells with predominance of T-cells. The morphologic and  immunophenotypic features are consistent with classical Hodgkin lymphoma  which is best subclassified as nodular sclerosis type.    04/03/2019 Imaging   PET: IMPRESSION: 1. Hypermetabolic lymphadenopathy involving the neck, chest and abdomen consistent with known Hodgkin's lymphoma. [place]au[place] 5. 2. No findings for osseous metastatic disease.   04/03/2019 Cancer Staging   Staging form: Hodgkin and Non-Hodgkin Lymphoma, AJCC 8th Edition - Clinical stage from 04/03/2019: Stage III (Hodgkin lymphoma) - Signed by Tish Men, MD on 04/08/2019   04/08/2019 -  Chemotherapy   The patient had DOXOrubicin (ADRIAMYCIN) chemo injection 50 mg, 25 mg/m2 = 50 mg, Intravenous,  Once, 1 of 6 cycles Administration: 50 mg (04/08/2019) palonosetron (ALOXI) injection 0.25 mg, 0.25 mg, Intravenous,  Once, 1 of 6 cycles Administration: 0.25 mg (04/08/2019) pegfilgrastim (NEULASTA ONPRO KIT) injection 6 mg, 6 mg, Subcutaneous, Once, 1 of 6 cycles Administration: 6 mg (04/08/2019) dacarbazine (DTIC) 750 mg in sodium chloride 0.9 % 250 mL chemo infusion, 375 mg/m2 = 750 mg, Intravenous,  Once, 1 of 6 cycles Administration: 750 mg (04/08/2019) fosaprepitant (EMEND) 150 mg, dexamethasone (DECADRON) 10 mg in sodium chloride 0.9 % 145 mL IVPB, 150 mg, Intravenous,  Once, 1 of 6 cycles Administration: 150 mg (04/08/2019) brentuximab vedotin (ADCETRIS) 100 mg in sodium chloride 0.9 % 100 mL chemo infusion, 1.15 mg/kg = 105 mg, Intravenous,  Once, 1 of 6 cycles Administration: 100 mg (04/08/2019) vinBLAStine (VELBAN) 12.1 mg in sodium  chloride 0.9 % 50 mL chemo infusion, 6 mg/m2 = 12.1 mg, Intravenous, Once, 1 of 6 cycles Administration: 12.1 mg (04/08/2019)  for chemotherapy treatment.      I have reviewed the past medical history, past surgical history, social history and family history with the patient and they are unchanged from previous note.  ALLERGIES:  has No Known Allergies.  MEDICATIONS:  Current Outpatient Medications  Medication Sig Dispense Refill  . atorvastatin (LIPITOR) 10 MG tablet TAKE 1 TABLET BY MOUTH EVERY DAY 90 tablet 1  . dexamethasone (DECADRON) 4 MG tablet Take 2 tablets by mouth once a day for 3 days after chemo. Take with food. 30 tablet 1  . guaiFENesin-dextromethorphan (ROBITUSSIN DM) 100-10 MG/5ML syrup Take 5 mLs by mouth every 4 (four) hours as needed for cough. 118 mL 0  . HYDROcodone-acetaminophen (NORCO) 5-325 MG tablet Take 1-2 tablets by mouth every 6 (six) hours as needed for moderate pain or severe pain. 20 tablet 0  . lidocaine-prilocaine (EMLA) cream Apply to affected area once 30 g 3  . LORazepam (ATIVAN) 0.5 MG tablet TAKE 1 TABLET (0.5 MG TOTAL) BY MOUTH EVERY 6 (SIX) HOURS AS NEEDED (NAUSEA OR VOMITING). 30 tablet 0  . losartan (COZAAR) 100  MG tablet TAKE 1 TABLET BY MOUTH EVERY DAY 90 tablet 1  . metFORMIN (GLUCOPHAGE) 500 MG tablet TAKE 1 TABLET BY MOUTH TWICE A DAY WITH MEALS 180 tablet 1  . ondansetron (ZOFRAN) 8 MG tablet Take 1 tablet (8 mg total) by mouth 2 (two) times daily as needed. Start on the third day after chemotherapy. 30 tablet 1  . prochlorperazine (COMPAZINE) 10 MG tablet Take 1 tablet (10 mg total) by mouth every 6 (six) hours as needed (Nausea or vomiting). 30 tablet 1  . sildenafil (VIAGRA) 100 MG tablet Take 0.5-1 tablets (50-100 mg total) by mouth daily as needed for erectile dysfunction. 10 tablet 1   No current facility-administered medications for this visit.    PHYSICAL EXAMINATION: ECOG PERFORMANCE STATUS: 0 - Asymptomatic  Today's Vitals    04/22/19 0850  BP: 107/61  Pulse: 75  Resp: 18  Temp: 98.9 F (37.2 C)  TempSrc: Temporal  SpO2: 97%  Weight: 183 lb 6.4 oz (83.2 kg)  Height: 5' 6"  (1.676 m)  PainSc: 0-No pain   Body mass index is 29.6 kg/m.  Filed Weights   04/22/19 0850  Weight: 183 lb 6.4 oz (83.2 kg)    GENERAL: alert, no distress and comfortable SKIN: skin color, texture, turgor are normal, no rashes or significant lesions EYES: conjunctiva are pink and non-injected, sclera clear OROPHARYNX: no exudate, no erythema; lips, buccal mucosa, and tongue normal  NECK: supple, non-tender LYMPH:  no palpable lymphadenopathy in the cervical LUNGS: clear to auscultation with normal breathing effort HEART: regular rate & rhythm and no murmurs and no lower extremity edema ABDOMEN: soft, non-tender, non-distended, normal bowel sounds Musculoskeletal: no cyanosis of digits and no clubbing  PSYCH: alert & oriented x 3, fluent speech  LABORATORY DATA:  I have reviewed the data as listed    Component Value Date/Time   NA 139 04/22/2019 0832   K 4.0 04/22/2019 0832   CL 102 04/22/2019 0832   CO2 29 04/22/2019 0832   GLUCOSE 150 (H) 04/22/2019 0832   BUN 11 04/22/2019 0832   CREATININE 0.93 04/22/2019 0832   CALCIUM 8.8 (L) 04/22/2019 0832   PROT 6.2 (L) 04/22/2019 0832   ALBUMIN 3.4 (L) 04/22/2019 0832   AST 17 04/22/2019 0832   ALT 24 04/22/2019 0832   ALKPHOS 56 04/22/2019 0832   BILITOT 0.5 04/22/2019 0832   GFRNONAA >60 04/22/2019 0832   GFRAA >60 04/22/2019 0832    No results found for: SPEP, UPEP  Lab Results  Component Value Date   WBC 13.0 (H) 04/22/2019   NEUTROABS 9.9 (H) 04/22/2019   HGB 11.5 (L) 04/22/2019   HCT 34.8 (L) 04/22/2019   MCV 84.1 04/22/2019   PLT 245 04/22/2019      Chemistry      Component Value Date/Time   NA 139 04/22/2019 0832   K 4.0 04/22/2019 0832   CL 102 04/22/2019 0832   CO2 29 04/22/2019 0832   BUN 11 04/22/2019 0832   CREATININE 0.93 04/22/2019 0832       Component Value Date/Time   CALCIUM 8.8 (L) 04/22/2019 0832   ALKPHOS 56 04/22/2019 0832   AST 17 04/22/2019 0832   ALT 24 04/22/2019 0832   BILITOT 0.5 04/22/2019 0832       RADIOGRAPHIC STUDIES: I have personally reviewed the radiological images as listed below and agreed with the findings in the report. PET, initial (skull base to thigh)  Result Date: 04/03/2019 CLINICAL DATA:  Initial treatment strategy  for nodular sclerosing Hodgkin's lymphoma. EXAM: NUCLEAR MEDICINE PET SKULL BASE TO THIGH TECHNIQUE: 9.34 mCi F-18 FDG was injected intravenously. Full-ring PET imaging was performed from the skull base to thigh after the radiotracer. CT data was obtained and used for attenuation correction and anatomic localization. Fasting blood glucose: 126 mg/dl COMPARISON:  CT scan 02/28/2019 FINDINGS: Mediastinal blood pool activity: SUV max 2.56 Liver activity: SUV max 3.22 NECK: Multi station neck adenopathy as demonstrated on prior neck CT. Posterior cervical lymph node on the left has an SUV max of 10.02. Left-sided level 3 lymph node has an SUV max of 7.99. Large supraclavicular nodal mass on the right measures 22 mm and has an SUV max of 12.28. Areas of hypermetabolic brown fat are also noted in the supraclavicular fossa regions bilaterally. Incidental CT findings: none CHEST: 6 cm paratracheal nodal mass has an SUV max of 11.73. No axillary or subpectoral adenopathy. No hypermetabolic lung lesions are identified. Incidental CT findings: Interstitial lung disease, emphysema and scattered calcified granulomas are noted. ABDOMEN/PELVIS: No enlargement of the liver spleen. No hypermetabolism or focal lesions. Scattered mildly hypermetabolic retroperitoneal lymph nodes. 10 mm node posterior to the IVC has an SUV max of 3.31. 14 mm paraduodenal lymph node has an SUV max of 5.46. No obvious pelvic or inguinal adenopathy. Incidental CT findings: none SKELETON: No definite findings for osseous lymphoma.  Incidental CT findings: none IMPRESSION: 1. Hypermetabolic lymphadenopathy involving the neck, chest and abdomen consistent with known Hodgkin's lymphoma. Deau[place] 5. 2. No findings for osseous metastatic disease. Electronically Signed   By: Marijo Sanes M.D.   On: 04/03/2019 12:53   ECHOCARDIOGRAM COMPLETE  Result Date: 04/01/2019   ECHOCARDIOGRAM REPORT   Patient Name:   TAAHIR GRISBY [facility] Date of Exam: 04/01/2019 Medical Rec #:  [id number]         Height:       66.0 in Accession #:    [id number]        Weight:       190.0 lb Date of Birth:  11-Nov-1955          BSA:          1.96 m Patient Age:    4 years          BP:           104/70 mmHg Patient Gender: M                 HR:           71 bpm. Exam Location:  [place] Procedure: 2D Echo, Cardiac Doppler, Color Doppler and Strain Analysis Indications:    Chemo  History:        Patient has prior history of Echocardiogram examinations, most                 recent 07/26/2011. Risk Factors:Diabetes and Hypertension.  Sonographer:    Cardell Peach RDCS (AE) Referring Phys: [id number] [place]  1. Left ventricular ejection fraction, by visual estimation, is 55 to 60%. The left ventricle has normal function. There is no left ventricular hypertrophy.  2. Left ventricular diastolic parameters are consistent with Grade I diastolic dysfunction (impaired relaxation).  3. 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 The left ventricle has normal function. The left ventricle is not well visualized. There is no left ventricular hypertrophy. Left ventricular diastolic  parameters are consistent with Grade I diastolic dysfunction (impaired relaxation). Normal left atrial pressure. Right Ventricle: The right ventricular size is normal. No increase in right ventricular wall thickness. Global RV systolic function is has normal systolic  function. Left Atrium: Left atrial size was normal in size. Right Atrium: Right atrial size was normal in size Pericardium: There is no evidence of pericardial effusion. Mitral Valve: The mitral valve is normal in structure. No evidence of mitral valve regurgitation. No evidence of mitral valve stenosis by observation. Tricuspid Valve: The tricuspid valve is normal in structure. Tricuspid valve regurgitation is trivial. Aortic Valve: The aortic valve is normal in structure. Aortic valve regurgitation is not visualized. The aortic valve is structurally normal, with no evidence of sclerosis or stenosis. Pulmonic Valve: The pulmonic valve was normal in structure. Pulmonic valve regurgitation is not visualized. Pulmonic regurgitation is not visualized. Aorta: The aortic root, ascending aorta and aortic arch are all structurally normal, with no evidence of dilitation or obstruction. 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 Venous: The inferior vena cava is normal in size with greater than 50% respiratory variability, suggesting right atrial pressure of 3 mmHg. IAS/Shunts: No atrial level shunt detected by color flow Doppler. There is no evidence of a patent foramen ovale. No ventricular septal defect is seen or detected. There is no evidence of an atrial septal defect.  LEFT VENTRICLE PLAX 2D LVIDd:         4.55 cm  Diastology LVIDs:         2.73 cm  LV e' lateral:   8.49 cm/s LV PW:         1.17 cm  LV E/e' lateral: 7.4 LV IVS:        1.18 cm  LV e' medial:    8.70 cm/s LVOT diam:     2.10 cm  LV E/e' medial:  7.2 LV SV:         67 ml LV SV Index:   33.06 LVOT Area:     3.46 cm  RIGHT VENTRICLE             IVC RV Basal diam:  3.07 cm     IVC diam: 1.22 cm RV S prime:     20.00 cm/s TAPSE (M-mode): 2.9 cm LEFT ATRIUM             Index       RIGHT ATRIUM           Index LA diam:        3.30 cm 1.69 cm/m  RA Area:     12.80 cm LA Vol (A2C):   48.1 ml 24.58 ml/m RA Volume:   27.50 ml   14.05 ml/m LA Vol (A4C):   49.1 ml 25.09 ml/m LA Biplane Vol: 50.7 ml 25.91 ml/m  AORTIC VALVE LVOT Vmax:   99.80 cm/s LVOT Vmean:  58.300 cm/s LVOT VTI:    0.182 m  AORTA Ao Root diam: 3.20 cm Ao Asc diam:  3.80 cm MITRAL VALVE MV Area (PHT): 2.43 cm             SHUNTS MV PHT:        90.48 msec           Systemic VTI:  0.18 m MV Decel Time: 312 msec             Systemic Diam: 2.10 cm MV E velocity: 62.60 cm/s 103 cm/s MV A velocity:  88.70 cm/s 70.3 cm/s MV E/A ratio:  0.71       1.5  Jyl Heinz MD Electronically signed by Jyl Heinz MD Signature Date/Time: 04/01/2019/5:58:21 PM    Final    IR port placement [place])  Result Date: 03/29/2019 CLINICAL DATA:  Hodgkin's LYMPHOMA, ACCESS FOR CHEMOTHERAPY EXAM: LEFT INTERNAL JUGULAR SINGLE LUMEN POWER PORT CATHETER INSERTION Date:  03/29/2019 03/29/2019 10:14 am Radiologist:  M. Daryll Brod, MD Guidance:  Ultrasound fluoroscopic MEDICATIONS: Ancef 2 g; The antibiotic was administered within an appropriate time interval prior to skin puncture. ANESTHESIA/SEDATION: Versed 2.0 mg IV; Fentanyl 100 mcg IV; Moderate Sedation Time:  32 minutes The patient was continuously monitored during the procedure by the interventional radiology nurse under my direct supervision. FLUOROSCOPY TIME:  One minutes, 54 seconds (25 mGy) COMPLICATIONS: None immediate. CONTRAST:  None. PROCEDURE: Informed consent was obtained from the patient following explanation of the procedure, risks, benefits and alternatives. The patient understands, agrees and consents for the procedure. All questions were addressed. A time out was performed. Maximal barrier sterile technique utilized including caps, mask, sterile gowns, sterile gloves, large sterile drape, hand hygiene, and 2% chlorhexidine scrub. Under sterile conditions and local anesthesia, left internal jugular micropuncture venous access was performed. Access was performed with ultrasound. Images were obtained for documentation  of the patent left internal jugular vein. A guide wire was inserted followed by a transitional dilator. This allowed insertion of a guide wire and catheter into the IVC. Measurements were obtained from the SVC / RA junction back to the left IJ venotomy site. In the left infraclavicular chest, a subcutaneous pocket was created over the second anterior rib. This was done under sterile conditions and local anesthesia. 1% lidocaine with epinephrine was utilized for this. A 2.5 cm incision was made in the skin. Blunt dissection was performed to create a subcutaneous pocket over the right pectoralis major muscle. The pocket was flushed with saline vigorously. There was adequate hemostasis. The port catheter was assembled and checked for leakage. The port catheter was secured in the pocket with two retention sutures. The tubing was tunneled subcutaneously to the left venotomy site and inserted into the SVC/RA junction through a valved peel-away sheath. Position was confirmed with fluoroscopy. Images were obtained for documentation. The patient tolerated the procedure well. No immediate complications. Incisions were closed in a two layer fashion with 4 - 0 Vicryl suture. Dermabond was applied to the skin. The port catheter was accessed, blood was aspirated followed by saline and heparin flushes. Needle was removed. A dry sterile dressing was applied. IMPRESSION: Ultrasound and fluoroscopically guided left internal jugular single lumen power port catheter insertion. Tip in the SVC/RA junction. Catheter ready for use. Electronically Signed   By: Jerilynn Mages.  Shick M.D.   On: 03/29/2019 10:59

## 2019-04-23 ENCOUNTER — Telehealth: Payer: Self-pay | Admitting: *Deleted

## 2019-04-23 ENCOUNTER — Other Ambulatory Visit: Payer: Self-pay | Admitting: *Deleted

## 2019-04-23 DIAGNOSIS — R05 Cough: Secondary | ICD-10-CM

## 2019-04-23 DIAGNOSIS — R059 Cough, unspecified: Secondary | ICD-10-CM

## 2019-04-23 NOTE — Telephone Encounter (Signed)
Received phone message from yest eve from [facility] reporting that CXR not done due to on body device.  He will return Wed after on body device removed.  Message routed to Dr Zhao/pod RN.

## 2019-04-23 NOTE — Telephone Encounter (Signed)
Thank you for letting me know.  Dr. Naziya Hegwood 

## 2019-04-24 ENCOUNTER — Other Ambulatory Visit: Payer: Self-pay | Admitting: Hematology

## 2019-04-24 ENCOUNTER — Other Ambulatory Visit (HOSPITAL_BASED_OUTPATIENT_CLINIC_OR_DEPARTMENT_OTHER): Payer: Self-pay | Admitting: Hematology

## 2019-04-24 ENCOUNTER — Ambulatory Visit (HOSPITAL_BASED_OUTPATIENT_CLINIC_OR_DEPARTMENT_OTHER)
Admission: RE | Admit: 2019-04-24 | Discharge: 2019-04-24 | Disposition: A | Discharge status: Home / Self Care | Payer: BC Managed Care – PPO | Source: Ambulatory Visit | Attending: Family | Admitting: Family

## 2019-04-24 ENCOUNTER — Other Ambulatory Visit: Payer: Self-pay

## 2019-04-24 ENCOUNTER — Other Ambulatory Visit: Payer: Self-pay | Admitting: *Deleted

## 2019-04-24 ENCOUNTER — Other Ambulatory Visit: Payer: Self-pay | Admitting: Family

## 2019-04-24 DIAGNOSIS — C8118 Nodular sclerosis classical Hodgkin lymphoma, lymph nodes of multiple sites: Secondary | ICD-10-CM | POA: Diagnosis present

## 2019-04-24 DIAGNOSIS — R05 Cough: Secondary | ICD-10-CM | POA: Insufficient documentation

## 2019-04-24 DIAGNOSIS — R059 Cough, unspecified: Secondary | ICD-10-CM

## 2019-04-25 ENCOUNTER — Telehealth: Payer: Self-pay | Admitting: *Deleted

## 2019-04-25 NOTE — Telephone Encounter (Signed)
-----   Message from Tish Men, MD sent at 04/25/2019 10:58 AM EST ----- Delrae Sawyers,  Can you let the patient know that chest x-ray did not show any pneumonia? If he develops fever, worsening cough, sputum production or shortness of breath, he should call us ASAP.   Thanks.  [place]  ----- Message ----- From: Eliezer Bottom, NP Sent: 04/25/2019  10:05 AM EST To: Tish Men, MD   ----- Message ----- From: Interface, Rad Results In Sent: 04/25/2019   6:21 AM EST To: Eliezer Bottom, NP

## 2019-04-25 NOTE — Telephone Encounter (Signed)
As noted below by Dr. Maylon Peppers, I informed the patient that he doesn't have pneumonia. However, if the cough worsens, increased shortness of breath, sputum production or fevers, please call the office ASAP. He verbalized understanding.

## 2019-05-06 ENCOUNTER — Inpatient Hospital Stay: Payer: BC Managed Care – PPO

## 2019-05-06 ENCOUNTER — Other Ambulatory Visit: Payer: Self-pay

## 2019-05-06 ENCOUNTER — Inpatient Hospital Stay (HOSPITAL_BASED_OUTPATIENT_CLINIC_OR_DEPARTMENT_OTHER): Payer: BC Managed Care – PPO | Admitting: Hematology

## 2019-05-06 ENCOUNTER — Encounter: Payer: Self-pay | Admitting: Hematology

## 2019-05-06 VITALS — BP 106/59 | HR 81 | Temp 99.1°F | Resp 17 | Ht 66.0 in | Wt 178.1 lb

## 2019-05-06 DIAGNOSIS — C8118 Nodular sclerosis classical Hodgkin lymphoma, lymph nodes of multiple sites: Secondary | ICD-10-CM

## 2019-05-06 DIAGNOSIS — K521 Toxic gastroenteritis and colitis: Secondary | ICD-10-CM | POA: Diagnosis not present

## 2019-05-06 DIAGNOSIS — D72829 Elevated white blood cell count, unspecified: Secondary | ICD-10-CM

## 2019-05-06 DIAGNOSIS — T451X5A Adverse effect of antineoplastic and immunosuppressive drugs, initial encounter: Secondary | ICD-10-CM

## 2019-05-06 DIAGNOSIS — D6481 Anemia due to antineoplastic chemotherapy: Secondary | ICD-10-CM

## 2019-05-06 LAB — CBC WITH DIFFERENTIAL (CANCER CENTER ONLY)
Abs Immature Granulocytes: 2.55 10*3/uL — ABNORMAL HIGH (ref 0.00–0.07)
Basophils Absolute: 0.1 10*3/uL (ref 0.0–0.1)
Basophils Relative: 0 %
Eosinophils Absolute: 0 10*3/uL (ref 0.0–0.5)
Eosinophils Relative: 0 %
HCT: 31.6 % — ABNORMAL LOW (ref 39.0–52.0)
Hemoglobin: 10.1 g/dL — ABNORMAL LOW (ref 13.0–17.0)
Immature Granulocytes: 13 %
Lymphocytes Relative: 11 %
Lymphs Abs: 2.2 10*3/uL (ref 0.7–4.0)
MCH: 27.2 pg (ref 26.0–34.0)
MCHC: 32 g/dL (ref 30.0–36.0)
MCV: 84.9 fL (ref 80.0–100.0)
Monocytes Absolute: 1.5 10*3/uL — ABNORMAL HIGH (ref 0.1–1.0)
Monocytes Relative: 8 %
Neutro Abs: 14 10*3/uL — ABNORMAL HIGH (ref 1.7–7.7)
Neutrophils Relative %: 68 %
Platelet Count: 230 10*3/uL (ref 150–400)
RBC: 3.72 MIL/uL — ABNORMAL LOW (ref 4.22–5.81)
RDW: 14.2 % (ref 11.5–15.5)
WBC Count: 20.4 10*3/uL — ABNORMAL HIGH (ref 4.0–10.5)
nRBC: 0.2 % (ref 0.0–0.2)

## 2019-05-06 LAB — CMP (CANCER CENTER ONLY)
ALT: 23 U/L (ref 0–44)
AST: 19 U/L (ref 15–41)
Albumin: 3.3 g/dL — ABNORMAL LOW (ref 3.5–5.0)
Alkaline Phosphatase: 67 U/L (ref 38–126)
Anion gap: 8 (ref 5–15)
BUN: 11 mg/dL (ref 8–23)
CO2: 27 mmol/L (ref 22–32)
Calcium: 8.7 mg/dL — ABNORMAL LOW (ref 8.9–10.3)
Chloride: 101 mmol/L (ref 98–111)
Creatinine: 0.93 mg/dL (ref 0.61–1.24)
GFR, Est AFR Am: >60 mL/min (ref >60)
GFR, Estimated: >60 mL/min (ref >60)
Glucose, Bld: 148 mg/dL — ABNORMAL HIGH (ref 70–99)
Potassium: 4.1 mmol/L (ref 3.5–5.1)
Sodium: 136 mmol/L (ref 135–145)
Total Bilirubin: 0.3 mg/dL (ref 0.3–1.2)
Total Protein: 6.2 g/dL — ABNORMAL LOW (ref 6.5–8.1)

## 2019-05-06 MED ORDER — DIPHENHYDRAMINE HCL 50 MG/ML IJ SOLN
50.0000 mg | Freq: Once | INTRAMUSCULAR | Status: AC
  Administered 2019-05-06: 11:00:00 50 mg via INTRAVENOUS

## 2019-05-06 MED ORDER — SODIUM CHLORIDE 0.9% FLUSH
10.0000 mL | INTRAVENOUS | Status: DC | PRN
  Administered 2019-05-06: 15:00:00 10 mL
  Filled 2019-05-06: qty 10

## 2019-05-06 MED ORDER — PALONOSETRON HCL INJECTION 0.25 MG/5ML
0.2500 mg | Freq: Once | INTRAVENOUS | Status: AC
  Administered 2019-05-06: 0.25 mg via INTRAVENOUS

## 2019-05-06 MED ORDER — ACETAMINOPHEN 325 MG PO TABS
650.0000 mg | ORAL_TABLET | Freq: Once | ORAL | Status: AC
  Administered 2019-05-06: 11:00:00 650 mg via ORAL

## 2019-05-06 MED ORDER — HEPARIN SOD (PORK) LOCK FLUSH 100 UNIT/ML IV SOLN
500.0000 [IU] | Freq: Once | INTRAVENOUS | Status: AC | PRN
  Administered 2019-05-06: 500 [IU]
  Filled 2019-05-06: qty 5

## 2019-05-06 MED ORDER — DEXAMETHASONE SODIUM PHOSPHATE 10 MG/ML IJ SOLN
10.0000 mg | Freq: Once | INTRAMUSCULAR | Status: AC
  Administered 2019-05-06: 11:00:00 10 mg via INTRAVENOUS

## 2019-05-06 MED ORDER — SODIUM CHLORIDE 0.9 % IV SOLN
150.0000 mg | Freq: Once | INTRAVENOUS | Status: AC
  Administered 2019-05-06: 12:00:00 150 mg via INTRAVENOUS
  Filled 2019-05-06: qty 5

## 2019-05-06 MED ORDER — SODIUM CHLORIDE 0.9 % IV SOLN
100.0000 mg | Freq: Once | INTRAVENOUS | Status: AC
  Administered 2019-05-06: 100 mg via INTRAVENOUS
  Filled 2019-05-06: qty 20

## 2019-05-06 MED ORDER — SODIUM CHLORIDE 0.9 % IV SOLN
Freq: Once | INTRAVENOUS | Status: DC
  Filled 2019-05-06: qty 250

## 2019-05-06 MED ORDER — DOXORUBICIN HCL CHEMO IV INJECTION 2 MG/ML
25.0000 mg/m2 | Freq: Once | INTRAVENOUS | Status: AC
  Administered 2019-05-06: 13:00:00 50 mg via INTRAVENOUS
  Filled 2019-05-06: qty 25

## 2019-05-06 MED ORDER — DIPHENHYDRAMINE HCL 25 MG PO CAPS
ORAL_CAPSULE | ORAL | Status: AC
  Filled 2019-05-06: qty 2

## 2019-05-06 MED ORDER — ACETAMINOPHEN 325 MG PO TABS
ORAL_TABLET | ORAL | Status: AC
  Filled 2019-05-06: qty 2

## 2019-05-06 MED ORDER — DIPHENHYDRAMINE HCL 50 MG/ML IJ SOLN
INTRAMUSCULAR | Status: AC
  Filled 2019-05-06: qty 1

## 2019-05-06 MED ORDER — PEGFILGRASTIM 6 MG/0.6ML ~~LOC~~ PSKT
PREFILLED_SYRINGE | SUBCUTANEOUS | Status: AC
  Filled 2019-05-06: qty 0.6

## 2019-05-06 MED ORDER — DEXAMETHASONE SODIUM PHOSPHATE 10 MG/ML IJ SOLN
INTRAMUSCULAR | Status: AC
  Filled 2019-05-06: qty 1

## 2019-05-06 MED ORDER — SODIUM CHLORIDE 0.9 % IV SOLN
375.0000 mg/m2 | Freq: Once | INTRAVENOUS | Status: AC
  Administered 2019-05-06: 750 mg via INTRAVENOUS
  Filled 2019-05-06: qty 75

## 2019-05-06 MED ORDER — PALONOSETRON HCL INJECTION 0.25 MG/5ML
INTRAVENOUS | Status: AC
  Filled 2019-05-06: qty 5

## 2019-05-06 MED ORDER — VINBLASTINE SULFATE CHEMO INJECTION 1 MG/ML
6.0000 mg/m2 | Freq: Once | INTRAVENOUS | Status: AC
  Administered 2019-05-06: 13:00:00 12.1 mg via INTRAVENOUS
  Filled 2019-05-06: qty 12.1

## 2019-05-06 MED ORDER — PEGFILGRASTIM 6 MG/0.6ML ~~LOC~~ PSKT
6.0000 mg | PREFILLED_SYRINGE | Freq: Once | SUBCUTANEOUS | Status: AC
  Administered 2019-05-06: 15:00:00 6 mg via SUBCUTANEOUS

## 2019-05-06 NOTE — Progress Notes (Signed)
[place] OFFICE PROGRESS NOTE  Patient Care Team: Saguier, [name] as PCP - General (Internal Medicine) Tish Men, MD as Medical Oncologist (Oncology) Cordelia Poche, RN as Oncology Nurse Navigator  HEME/ONC OVERVIEW: 1. Classic Hodgkin lymphoma, nodular sclerosis subtype; at least Stage III -02/2019: enlarged R supraclavicular LN's (~4 and 3cm) on neck US   Enlarged cervical, bulky R supraclavicular, mediastinal, and RP adenopathy  Core R cervical LN bx non-diagnostic   Excisional LN bx showed classic Hodgkin lymphoma, nodular sclerosis subtype   FDG-avid LN's in neck, chest and abdomen; no bony abnormalities  -Late 03/2019 - present: AVD + Adcetris  TREATMENT SUMMARY:  04/08/2019 - present: AVD + Adcetris with Onpro   ASSESSMENT & PLAN:    Stage III Classic Hodgkin lymphoma, nodular sclerosis subtype -S/p C1 of AVD + Adcetris -Patient tolerated the treatment relatively well -Labs reviewed and adequate today, proceed with C2D1 of chemotherapy -We will plan to repeat PET after 2 cycles of treatment to assess interim disease response -PRN anti-emetics: Zofran, Compazine, Ativan, and the dexamethasone  Chemotherapy-associated anemia -Secondary to chemotherapy, as well as anemia of chronic disease  -Hgb 10.1, lower than the last visit  -Patient denies any symptom of bleeding -We will monitor for now; no indication for dose adjustment  Leukocytosis -Likely secondary to Onpro -WBC 20.4k, higher than the last visit  -Patient denies any recent infection -I also counseled the patient on some of the symptoms of infection, including fever (T > 100.4), dyspnea, chest pain, sputum production, for which he is instructed to contact the clinic for further instruction  -We will monitor it for now  Dry cough -Etiology unclear, possible reactive airway disease vs medications (losartan) -No other associated symptoms, such as fever, chest pain, dyspnea, or sputum  production -CXR unremarkable  -If patient develops any other associated symptoms, he is instructed to contact the clinic ASAP for further evaluation -Continue OTC Robitussin and cough drops PRN  Chemotherapy-associated diarrhea -Secondary to chemotherapy -Grade 1-2, controlled with Imodium -I counseled the patient on the appropriate usage of Imodium, including 2 tabs of Imodium at the onset of diarrhea, and then every 2 hours until the diarrhea stops  -Due to the borderline BP, I have also ordered 1L NS today -He is instructed to contact the clinic if he is unable to maintain adequate hydration, so that we can set him up for IV fluid support as needed   No orders of the defined types were placed in this encounter.  All questions were answered. The patient knows to call the clinic with any problems, questions or concerns. No barriers to learning was detected.  Return in 2 weeks for C2D15 of AVD + Adcetris.   Tish Men, MD 1/25/202111:06 AM  CHIEF COMPLAINT: "I am doing okay"  INTERVAL HISTORY: Steve [name] returns to clinic for follow-up of Hodgkin lymphoma on AVD + Adcetris.  Patient reports that he still has an intermittent cough, a few times a day, lasting 5 to 10 minutes each, sometimes triggered by exertion, for which he takes Robitussin and cough drops as needed with relief.  He denies any chest pain, dyspnea, sputum production, or fever.  He also has periodic diarrhea, varying in frequency, small volume, for which he takes Imodium as needed with improvement.  His appetite has been less, and his wife has been urging him to drink more protein supplements.  He denies any numbness or tingling in hands or feet.  He denies any other complaint today.  REVIEW OF SYSTEMS:   Constitutional: ( - ) fevers, ( - )  chills , ( - ) night sweats Eyes: ( - ) blurriness of vision, ( - ) double vision, ( - ) watery eyes Ears, nose, mouth, throat, and face: ( - ) mucositis, ( - ) sore  throat Respiratory: ( - ) cough, ( - ) dyspnea, ( - ) wheezes Cardiovascular: ( - ) palpitation, ( - ) chest discomfort, ( - ) lower extremity swelling Gastrointestinal:  ( - ) nausea, ( - ) heartburn, ( + ) change in bowel habits Skin: ( - ) abnormal skin rashes Lymphatics: ( - ) new lymphadenopathy, ( - ) easy bruising Neurological: ( - ) numbness, ( - ) tingling, ( - ) new weaknesses Behavioral/Psych: ( - ) mood change, ( - ) new changes  All other systems were reviewed with the patient and are negative.  SUMMARY OF ONCOLOGIC HISTORY: Oncology History  Hodgkin lymphoma ([place])  02/25/2019 Initial Diagnosis   Hodgkin lymphoma ([place]   02/28/2019 Imaging   CT neck: IMPRESSION: Right paratracheal enlarged lymph node and right supraclavicular enlarged lymph nodes compatible with neoplasm. No pharyngeal mass in the neck. No other adenopathy in the neck. Tissue sampling is recommended.   02/28/2019 Imaging   CT CAP: IMPRESSION: 1. Bulky right supraclavicular and mediastinal adenopathy most compatible with metastatic disease. Additionally there is porta hepatic and retroperitoneal adenopathy within the abdomen concerning for metastatic disease. 2. Mild fibrotic changes involving the lungs bilaterally. 3. See dedicated neck CT report.   03/08/2019 Pathology Results   SURGICAL PATHOLOGY  CASE: [id number]  [name]  Surgical Pathology Report      Clinical History: No known primary, now with right supraclavicular  lymphadenopathy, post US guided Bx. (cm)      FINAL MICROSCOPIC DIAGNOSIS:   A. LYMPH NODE, RIGHT SUPRACLAVICULAR, BIOPSY:  - Atypical lymphoid proliferation  - See comment   COMMENT:   The sections show needle core biopsy fragments of lymph nodal tissue  displaying predominance of small round to slightly irregular lymphocytes  admixed with a minor population of scattered large atypical mononuclear  and multilobated lymphoid appearing  cells with variably prominent  nucleoli.  This is admixed with scattering of eosinophils in some areas.  Flow cytometric analysis was attempted but there was insufficient  material present in the sample ([place]).  Immunohistochemical  stains were performed including CD10, CD15, CD20, CD30, CD5, LCA, cyclin  D1, PAX 5, CD3 and EBV with appropriate controls.  LCA is diffusely  positive.  The small lymphoid cells show a mixture of T and B cells with  predominance of T cells.  No significant EBV, CD10 or cyclin D1  positivity is identified.  The larger atypical lymphoid cells appear to  be positive for CD30 and some for CD15 and PAX 5.  The overall findings  are very limited but atypical and worrisome for a lymphoproliferative  process particularly Hodgkin lymphoma.  Excisional biopsy is recommended  including fresh tissue for lymphoma work-up in order to further evaluate  this process    03/20/2019 Pathology Results   FINAL MICROSCOPIC DIAGNOSIS:   A. LYMPH NODE, DEEP RIGHT CERVICAL POSTERIOR TRIANGLE, EXCISION:  -Classical Hodgkin lymphoma  -See comment   COMMENT:   Sections of the lymph node show effacement of the architecture by a  vaguely nodular lymphoproliferative process characterized by a  polymorphous cellular proliferation of small lymphocytes, eosinophils,  plasma cells in addition to variable numbers of  large atypical  mononuclear and multi-lobated lymphoid appearing cells with variably  prominent nucleoli characteristic of Reed-Sternberg cells and variants  including lacunar cells.  Areas of early fibrosis surrounding some of  the nodules are also present.  Flow cytometric analysis was performed  ([id number]) and shows predominance of T lymphocytes with nonspecific  changes in addition to a minor polyclonal B-cell population with no  abnormal phenotype. Immunohistochemical stains were performed including  CD20, CD3, PAX 5, CD15, CD30, LCA and in situ hybridization for  EBV with  appropriate controls.  The large atypical lymphoid appearing cells are  positive for CD15, CD30 and weakly for PAX 5 and negative for CD3, CD20,  LCA and EBV.  The small lymphocytes in the background show a mixture of  T and B-cells with predominance of T-cells. The morphologic and  immunophenotypic features are consistent with classical Hodgkin lymphoma  which is best subclassified as nodular sclerosis type.    04/03/2019 Imaging   PET: IMPRESSION: 1. Hypermetabolic lymphadenopathy involving the neck, chest and abdomen consistent with known Hodgkin's lymphoma. [place] 5. 2. No findings for osseous metastatic disease.   04/03/2019 Cancer Staging   Staging form: Hodgkin and Non-Hodgkin Lymphoma, AJCC 8th Edition - Clinical stage from 04/03/2019: Stage III (Hodgkin lymphoma) - Signed by Tish Men, MD on 04/08/2019   04/08/2019 -  Chemotherapy   The patient had DOXOrubicin (ADRIAMYCIN) chemo injection 50 mg, 25 mg/m2 = 50 mg, Intravenous,  Once, 1 of 6 cycles Administration: 50 mg (04/08/2019), 50 mg (04/22/2019) palonosetron (ALOXI) injection 0.25 mg, 0.25 mg, Intravenous,  Once, 1 of 6 cycles Administration: 0.25 mg (04/08/2019), 0.25 mg (04/22/2019) pegfilgrastim (NEULASTA ONPRO KIT) injection 6 mg, 6 mg, Subcutaneous, Once, 1 of 6 cycles Administration: 6 mg (04/08/2019), 6 mg (04/22/2019) dacarbazine (DTIC) 750 mg in sodium chloride 0.9 % 250 mL chemo infusion, 375 mg/m2 = 750 mg, Intravenous,  Once, 1 of 6 cycles Administration: 750 mg (04/08/2019), 750 mg (04/22/2019) fosaprepitant (EMEND) 150 mg, dexamethasone (DECADRON) 10 mg in sodium chloride 0.9 % 145 mL IVPB, 150 mg, Intravenous,  Once, 1 of 6 cycles Administration: 150 mg (04/08/2019), 150 mg (04/22/2019) brentuximab vedotin (ADCETRIS) 100 mg in sodium chloride 0.9 % 100 mL chemo infusion, 1.15 mg/kg = 105 mg, Intravenous,  Once, 1 of 6 cycles Administration: 100 mg (04/08/2019), 100 mg (04/22/2019) vinBLAStine  (VELBAN) 12.1 mg in sodium chloride 0.9 % 50 mL chemo infusion, 6 mg/m2 = 12.1 mg, Intravenous, Once, 1 of 6 cycles Administration: 12.1 mg (04/08/2019), 12 mg (04/22/2019)  for chemotherapy treatment.      I have reviewed the past medical history, past surgical history, social history and family history with the patient and they are unchanged from previous note.  ALLERGIES:  has No Known Allergies.  MEDICATIONS:  Current Outpatient Medications  Medication Sig Dispense Refill  . atorvastatin (LIPITOR) 10 MG tablet TAKE 1 TABLET BY MOUTH EVERY DAY 90 tablet 1  . dexamethasone (DECADRON) 4 MG tablet Take 2 tablets by mouth once a day for 3 days after chemo. Take with food. 30 tablet 1  . guaiFENesin-dextromethorphan (ROBITUSSIN DM) 100-10 MG/5ML syrup Take 5 mLs by mouth every 4 (four) hours as needed for cough. 118 mL 0  . HYDROcodone-acetaminophen (NORCO) 5-325 MG tablet Take 1-2 tablets by mouth every 6 (six) hours as needed for moderate pain or severe pain. 20 tablet 0  . lidocaine-prilocaine (EMLA) cream Apply to affected area once 30 g 3  . LORazepam (ATIVAN) 0.5 MG tablet TAKE  1 TABLET (0.5 MG TOTAL) BY MOUTH EVERY 6 (SIX) HOURS AS NEEDED (NAUSEA OR VOMITING). 30 tablet 0  . losartan (COZAAR) 100 MG tablet TAKE 1 TABLET BY MOUTH EVERY DAY 90 tablet 1  . metFORMIN (GLUCOPHAGE) 500 MG tablet TAKE 1 TABLET BY MOUTH TWICE A DAY WITH MEALS 180 tablet 1  . ondansetron (ZOFRAN) 8 MG tablet Take 1 tablet (8 mg total) by mouth 2 (two) times daily as needed. Start on the third day after chemotherapy. 30 tablet 1  . prochlorperazine (COMPAZINE) 10 MG tablet Take 1 tablet (10 mg total) by mouth every 6 (six) hours as needed (Nausea or vomiting). 30 tablet 1  . sildenafil (VIAGRA) 100 MG tablet Take 0.5-1 tablets (50-100 mg total) by mouth daily as needed for erectile dysfunction. 10 tablet 1   No current facility-administered medications for this visit.   Facility-Administered Medications Ordered  in Other Visits  Medication Dose Route Frequency Provider Last Rate Last Admin  . 0.9 %  sodium chloride infusion   Intravenous Once Tish Men, MD        PHYSICAL EXAMINATION: ECOG PERFORMANCE STATUS: 1 - Symptomatic but completely ambulatory  Today's Vitals   05/06/19 1039  BP: (!) 106/59  Pulse: 81  Resp: 17  Temp: 99.1 F (37.3 C)  TempSrc: Temporal  SpO2: 96%  Weight: 178 lb 1.9 oz (80.8 kg)  Height: 5' 6"  (1.676 m)  PainSc: 0-No pain   Body mass index is 28.75 kg/m.  Filed Weights   05/06/19 1039  Weight: 178 lb 1.9 oz (80.8 kg)    GENERAL: alert, no distress and comfortable SKIN: skin color, texture, turgor are normal, no rashes or significant lesions EYES: conjunctiva are pink and non-injected, sclera clear OROPHARYNX: no exudate, no erythema; lips, buccal mucosa, and tongue normal  NECK: supple, non-tender LYMPH:  no palpable lymphadenopathy in the cervical LUNGS: clear to auscultation with normal breathing effort HEART: regular rate & rhythm and no murmurs and no lower extremity edema ABDOMEN: soft, non-tender, non-distended, normal bowel sounds Musculoskeletal: no cyanosis of digits and no clubbing  PSYCH: alert & oriented x 3, fluent speech  LABORATORY DATA:  I have reviewed the data as listed    Component Value Date/Time   NA 136 05/06/2019 1000   K 4.1 05/06/2019 1000   CL 101 05/06/2019 1000   CO2 27 05/06/2019 1000   GLUCOSE 148 (H) 05/06/2019 1000   BUN 11 05/06/2019 1000   CREATININE 0.93 05/06/2019 1000   CALCIUM 8.7 (L) 05/06/2019 1000   PROT 6.2 (L) 05/06/2019 1000   ALBUMIN 3.3 (L) 05/06/2019 1000   AST 19 05/06/2019 1000   ALT 23 05/06/2019 1000   ALKPHOS 67 05/06/2019 1000   BILITOT 0.3 05/06/2019 1000   GFRNONAA >60 05/06/2019 1000   GFRAA >60 05/06/2019 1000    No results found for: SPEP, UPEP  Lab Results  Component Value Date   WBC 20.4 (H) 05/06/2019   NEUTROABS 14.0 (H) 05/06/2019   HGB 10.1 (L) 05/06/2019   HCT 31.6  (L) 05/06/2019   MCV 84.9 05/06/2019   PLT 230 05/06/2019      Chemistry      Component Value Date/Time   NA 136 05/06/2019 1000   K 4.1 05/06/2019 1000   CL 101 05/06/2019 1000   CO2 27 05/06/2019 1000   BUN 11 05/06/2019 1000   CREATININE 0.93 05/06/2019 1000      Component Value Date/Time   CALCIUM 8.7 (L) 05/06/2019 1000  ALKPHOS 67 05/06/2019 1000   AST 19 05/06/2019 1000   ALT 23 05/06/2019 1000   BILITOT 0.3 05/06/2019 1000       RADIOGRAPHIC STUDIES: I have personally reviewed the radiological images as listed below and agreed with the findings in the report. DG Chest 2 View  Result Date: 04/25/2019 CLINICAL DATA:  Cough.  History of lymphoma. EXAM: CHEST - 2 VIEW COMPARISON:  PET-CT 04/03/2019. CT chest 02/28/2019. Chest x-ray 05/06/2005. FINDINGS: Surgical clips right upper chest. PowerPort catheter noted with tip over superior vena cava. Significant reduction in size of mediastinal adenopathy. Heart size normal. Low lung volumes with bibasilar atelectasis. Mild infiltrates in the left mid lung and lung bases cannot be excluded. No pleural effusion or pneumothorax. Diffuse osteopenia degenerative change thoracic spine. No acute bony abnormality. IMPRESSION: PowerPort catheter noted with tip over SVC. 2.  Significant reduction in size of mediastinal adenopathy. 3. Low lung volumes with bibasilar atelectasis. Mild infiltrates in the left mid lung and lung bases cannot be excluded. Electronically Signed   By: Marcello Moores  Register   On: 04/25/2019 06:19

## 2019-05-06 NOTE — Patient Instructions (Signed)

## 2019-05-07 ENCOUNTER — Other Ambulatory Visit: Payer: Self-pay | Admitting: Family

## 2019-05-16 ENCOUNTER — Inpatient Hospital Stay (HOSPITAL_BASED_OUTPATIENT_CLINIC_OR_DEPARTMENT_OTHER)
Admission: EM | Admit: 2019-05-16 | Discharge: 2019-05-22 | DRG: 808 | Disposition: A | Discharge status: Home / Self Care | Payer: BC Managed Care – PPO | Attending: Family Medicine | Admitting: Family Medicine

## 2019-05-16 ENCOUNTER — Other Ambulatory Visit: Payer: Self-pay

## 2019-05-16 ENCOUNTER — Encounter (HOSPITAL_BASED_OUTPATIENT_CLINIC_OR_DEPARTMENT_OTHER): Payer: Self-pay | Admitting: *Deleted

## 2019-05-16 ENCOUNTER — Emergency Department (HOSPITAL_BASED_OUTPATIENT_CLINIC_OR_DEPARTMENT_OTHER): Payer: BC Managed Care – PPO

## 2019-05-16 DIAGNOSIS — D701 Agranulocytosis secondary to cancer chemotherapy: Principal | ICD-10-CM | POA: Diagnosis present

## 2019-05-16 DIAGNOSIS — Z8249 Family history of ischemic heart disease and other diseases of the circulatory system: Secondary | ICD-10-CM

## 2019-05-16 DIAGNOSIS — U071 COVID-19: Secondary | ICD-10-CM | POA: Diagnosis not present

## 2019-05-16 DIAGNOSIS — R0902 Hypoxemia: Secondary | ICD-10-CM | POA: Diagnosis present

## 2019-05-16 DIAGNOSIS — C8190 Hodgkin lymphoma, unspecified, unspecified site: Secondary | ICD-10-CM | POA: Diagnosis present

## 2019-05-16 DIAGNOSIS — D649 Anemia, unspecified: Secondary | ICD-10-CM | POA: Diagnosis not present

## 2019-05-16 DIAGNOSIS — Z79899 Other long term (current) drug therapy: Secondary | ICD-10-CM

## 2019-05-16 DIAGNOSIS — T380X5A Adverse effect of glucocorticoids and synthetic analogues, initial encounter: Secondary | ICD-10-CM | POA: Diagnosis present

## 2019-05-16 DIAGNOSIS — Z808 Family history of malignant neoplasm of other organs or systems: Secondary | ICD-10-CM | POA: Diagnosis not present

## 2019-05-16 DIAGNOSIS — T451X5A Adverse effect of antineoplastic and immunosuppressive drugs, initial encounter: Secondary | ICD-10-CM | POA: Diagnosis present

## 2019-05-16 DIAGNOSIS — G4733 Obstructive sleep apnea (adult) (pediatric): Secondary | ICD-10-CM | POA: Diagnosis present

## 2019-05-16 DIAGNOSIS — E871 Hypo-osmolality and hyponatremia: Secondary | ICD-10-CM | POA: Diagnosis not present

## 2019-05-16 DIAGNOSIS — K521 Toxic gastroenteritis and colitis: Secondary | ICD-10-CM | POA: Diagnosis present

## 2019-05-16 DIAGNOSIS — I10 Essential (primary) hypertension: Secondary | ICD-10-CM | POA: Diagnosis present

## 2019-05-16 DIAGNOSIS — D709 Neutropenia, unspecified: Secondary | ICD-10-CM

## 2019-05-16 DIAGNOSIS — E43 Unspecified severe protein-calorie malnutrition: Secondary | ICD-10-CM | POA: Diagnosis present

## 2019-05-16 DIAGNOSIS — Z96643 Presence of artificial hip joint, bilateral: Secondary | ICD-10-CM | POA: Diagnosis present

## 2019-05-16 DIAGNOSIS — D638 Anemia in other chronic diseases classified elsewhere: Secondary | ICD-10-CM | POA: Diagnosis present

## 2019-05-16 DIAGNOSIS — R5081 Fever presenting with conditions classified elsewhere: Secondary | ICD-10-CM | POA: Diagnosis present

## 2019-05-16 DIAGNOSIS — Z7984 Long term (current) use of oral hypoglycemic drugs: Secondary | ICD-10-CM | POA: Diagnosis not present

## 2019-05-16 DIAGNOSIS — C8118 Nodular sclerosis classical Hodgkin lymphoma, lymph nodes of multiple sites: Secondary | ICD-10-CM | POA: Diagnosis not present

## 2019-05-16 DIAGNOSIS — E785 Hyperlipidemia, unspecified: Secondary | ICD-10-CM | POA: Diagnosis present

## 2019-05-16 DIAGNOSIS — J1282 Pneumonia due to coronavirus disease 2019: Secondary | ICD-10-CM | POA: Diagnosis present

## 2019-05-16 DIAGNOSIS — E1165 Type 2 diabetes mellitus with hyperglycemia: Secondary | ICD-10-CM | POA: Diagnosis present

## 2019-05-16 DIAGNOSIS — E739 Lactose intolerance, unspecified: Secondary | ICD-10-CM

## 2019-05-16 LAB — CBC WITH DIFFERENTIAL/PLATELET
Abs Immature Granulocytes: 0.01 10*3/uL (ref 0.00–0.07)
Basophils Absolute: 0 10*3/uL (ref 0.0–0.1)
Basophils Relative: 3 %
Eosinophils Absolute: 0 10*3/uL (ref 0.0–0.5)
Eosinophils Relative: 1 %
HCT: 26.8 % — ABNORMAL LOW (ref 39.0–52.0)
Hemoglobin: 8.9 g/dL — ABNORMAL LOW (ref 13.0–17.0)
Immature Granulocytes: 1 %
Lymphocytes Relative: 56 %
Lymphs Abs: 0.4 10*3/uL — ABNORMAL LOW (ref 0.7–4.0)
MCH: 27.6 pg (ref 26.0–34.0)
MCHC: 33.2 g/dL (ref 30.0–36.0)
MCV: 83 fL (ref 80.0–100.0)
Monocytes Absolute: 0.3 10*3/uL (ref 0.1–1.0)
Monocytes Relative: 35 %
Neutro Abs: 0 10*3/uL — ABNORMAL LOW (ref 1.7–7.7)
Neutrophils Relative %: 4 %
Platelets: 213 10*3/uL (ref 150–400)
RBC: 3.23 MIL/uL — ABNORMAL LOW (ref 4.22–5.81)
RDW: 13.8 % (ref 11.5–15.5)
WBC: 0.8 10*3/uL — CL (ref 4.0–10.5)
nRBC: 0 % (ref 0.0–0.2)

## 2019-05-16 LAB — COMPREHENSIVE METABOLIC PANEL
ALT: 30 U/L (ref 0–44)
AST: 18 U/L (ref 15–41)
Albumin: 2.8 g/dL — ABNORMAL LOW (ref 3.5–5.0)
Alkaline Phosphatase: 47 U/L (ref 38–126)
Anion gap: 9 (ref 5–15)
BUN: 10 mg/dL (ref 8–23)
CO2: 23 mmol/L (ref 22–32)
Calcium: 7.9 mg/dL — ABNORMAL LOW (ref 8.9–10.3)
Chloride: 95 mmol/L — ABNORMAL LOW (ref 98–111)
Creatinine, Ser: 0.92 mg/dL (ref 0.61–1.24)
GFR calc Af Amer: >60 mL/min (ref >60)
GFR calc non Af Amer: >60 mL/min (ref >60)
Glucose, Bld: 173 mg/dL — ABNORMAL HIGH (ref 70–99)
Potassium: 3.6 mmol/L (ref 3.5–5.1)
Sodium: 127 mmol/L — ABNORMAL LOW (ref 135–145)
Total Bilirubin: 0.9 mg/dL (ref 0.3–1.2)
Total Protein: 6.1 g/dL — ABNORMAL LOW (ref 6.5–8.1)

## 2019-05-16 LAB — LACTIC ACID, PLASMA: Lactic Acid, Venous: 1.2 mmol/L (ref 0.5–1.9)

## 2019-05-16 LAB — URINALYSIS, ROUTINE W REFLEX MICROSCOPIC
Bilirubin Urine: NEGATIVE
Glucose, UA: NEGATIVE mg/dL
Hgb urine dipstick: NEGATIVE
Ketones, ur: NEGATIVE mg/dL
Leukocytes,Ua: NEGATIVE
Nitrite: NEGATIVE
Protein, ur: NEGATIVE mg/dL
Specific Gravity, Urine: <1.005 — ABNORMAL LOW (ref 1.005–1.030)
pH: 6.5 (ref 5.0–8.0)

## 2019-05-16 LAB — SARS CORONAVIRUS 2 BY RT PCR (HOSPITAL ORDER, PERFORMED IN ~~LOC~~ HOSPITAL LAB): SARS Coronavirus 2: POSITIVE — AB

## 2019-05-16 LAB — PROCALCITONIN: Procalcitonin: 0.65 ng/mL

## 2019-05-16 MED ORDER — SODIUM CHLORIDE 0.9 % IV SOLN
INTRAVENOUS | Status: DC | PRN
  Administered 2019-05-16: 500 mL via INTRAVENOUS

## 2019-05-16 MED ORDER — SODIUM CHLORIDE 0.9 % IV BOLUS (SEPSIS)
1000.0000 mL | Freq: Once | INTRAVENOUS | Status: AC
  Administered 2019-05-16: 1000 mL via INTRAVENOUS

## 2019-05-16 MED ORDER — VANCOMYCIN HCL 1250 MG/250ML IV SOLN
1250.0000 mg | INTRAVENOUS | Status: DC
  Administered 2019-05-17 – 2019-05-18 (×2): 1250 mg via INTRAVENOUS
  Filled 2019-05-16 (×3): qty 250

## 2019-05-16 MED ORDER — VANCOMYCIN HCL IN DEXTROSE 1-5 GM/200ML-% IV SOLN
1000.0000 mg | Freq: Once | INTRAVENOUS | Status: AC
  Administered 2019-05-16: 1000 mg via INTRAVENOUS
  Filled 2019-05-16: qty 200

## 2019-05-16 MED ORDER — METRONIDAZOLE IN NACL 5-0.79 MG/ML-% IV SOLN
500.0000 mg | Freq: Once | INTRAVENOUS | Status: AC
  Administered 2019-05-16: 500 mg via INTRAVENOUS
  Filled 2019-05-16: qty 100

## 2019-05-16 MED ORDER — ACETAMINOPHEN 325 MG PO TABS
650.0000 mg | ORAL_TABLET | Freq: Once | ORAL | Status: AC
  Administered 2019-05-16: 650 mg via ORAL
  Filled 2019-05-16: qty 2

## 2019-05-16 MED ORDER — SODIUM CHLORIDE 0.9 % IV SOLN
2.0000 g | Freq: Three times a day (TID) | INTRAVENOUS | Status: DC
  Administered 2019-05-17 – 2019-05-21 (×13): 2 g via INTRAVENOUS
  Filled 2019-05-16 (×15): qty 2

## 2019-05-16 MED ORDER — VANCOMYCIN HCL 500 MG IV SOLR
INTRAVENOUS | Status: AC
  Filled 2019-05-16: qty 500

## 2019-05-16 MED ORDER — VANCOMYCIN HCL 500 MG/100ML IV SOLN
500.0000 mg | Freq: Once | INTRAVENOUS | Status: AC
  Administered 2019-05-16: 500 mg via INTRAVENOUS
  Filled 2019-05-16: qty 100

## 2019-05-16 MED ORDER — SODIUM CHLORIDE 0.9 % IV SOLN
2.0000 g | Freq: Once | INTRAVENOUS | Status: AC
  Administered 2019-05-16: 2 g via INTRAVENOUS
  Filled 2019-05-16: qty 2

## 2019-05-16 NOTE — ED Provider Notes (Signed)
[facility] EMERGENCY DEPARTMENT Provider Note   CSN: [id number] Arrival date & time: 05/16/19  1806     History Chief Complaint  Patient presents with  . Fever  . Cough    Steve [name] is a 64 y.o. male.  Past medical history most concerning for Hodgkin's lymphoma on chemotherapy presenting to ER with chief complaint of fever.  Patient reports around 4:00 pm had a fever up to 102.  States he has had a cough that has been relatively stable over the last few weeks to past couple months.  He does not have any shortness of breath, no abdominal pain no vomiting.  Has not had Covid yet.  Has not had prior neutropenic fever.     HPI     Past Medical History:  Diagnosis Date  . Abscess of muscle 08/10/2011   staph infection of right hip   . Diabetes mellitus without complication ([place])   . GERD (gastroesophageal reflux disease)    rare reflux - no meds for reflux - NO PROBLEM IN PAST SEVERAL YRS  . Hip dysplasia, congenital    no surgery as a child for hip dysplasia - has had bilateral hip replacements as an adult  . Hodgkin lymphoma ([place])   . Hypertension   . Pancreatitis   . Postoperative anemia due to acute blood loss 09/07/2012  . Septic arthritis of hip ([place]) 09/05/2012   PT'S TOTAL HIP JOINT REMOVED - ANTIBIOTIC SPACE PLACED AND PT HAS FINISHED IV ANTIBIOTICS ( PICC LINE REMOVED)  . Sleep apnea    USES CPAP    Patient Active Problem List   Diagnosis Date Noted  . Neutropenic fever ([place]) 05/16/2019  . 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 Chemotherapy-induced diarrhea 04/22/2019  . Anemia due to antineoplastic chemotherapy 03/25/2019  . Hodgkin lymphoma ([place]) 02/25/2019  . Abnormal CT scan, pancreas - tail area 01/31/2013  . 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 Septic arthritis of hip ([place]) 09/05/2012  . Tick bite of abdomen 11/09/2011  . Routine general medical examination at a health care facility 09/12/2011  . Elbow pain  09/12/2011  . S/P PICC central line placement 08/10/2011  . Abscess of muscle 08/10/2011  . H/O dental abscess 08/10/2011  . Staphylococcus aureus bacteremia 07/27/2011  . Elevated BP 09/07/2010  . Abdominal pain, RLQ 09/02/2010  . HYPOGONADISM, MALE 03/23/2007  . IMPAIRED GLUCOSE TOLERANCE 03/23/2007  . SLEEP APNEA 03/23/2007    Past Surgical History:  Procedure Laterality Date  . COLONOSCOPY  04/26/2007  . HERNIA REPAIR     inguinal hernia x3  . IR IMAGING GUIDED PORT INSERTION  03/29/2019  . JOINT REPLACEMENT  2002 & 2007   bilateral hip replacement  . LYMPH NODE BIOPSY Right 03/20/2019   Procedure: DEEP RIGHT SUPRACLAVICULAR LYMPH NODE EXCISION;  Surgeon: Fanny Skates, MD;  Location: [facility];  Service: General;  Laterality: Right;  . MULTIPLE EXTRACTIONS WITH ALVEOLOPLASTY  07/28/2011   Procedure: MULTIPLE EXTRACION WITH ALVEOLOPLASTY;  Surgeon: Lenn Cal, DDS;  Location: [facility];  Service: Oral Surgery;  Laterality: N/A;  Extraction of tooth #'s 2,3,4,5,6,11,12,13,15,19,22 with alveoloplasty.  . shoulder repair - right for separation of shoulder    . TEE WITHOUT CARDIOVERSION  07/29/2011   Procedure: TRANSESOPHAGEAL ECHOCARDIOGRAM (TEE);  Surgeon: Josue Hector, MD;  Location: [facility];  Service: Cardiovascular;  Laterality: N/A;  . TOTAL HIP REVISION Right 09/05/2012   Procedure: RIGHT HIP RESECTION ARTHROPLASTY WITH ANTIBIOTIC SPACERS;  Surgeon: Pilar Plate  Zella Ball, MD;  Location: [facility];  Service: Orthopedics;  Laterality: Right;  . TOTAL HIP REVISION Right 11/30/2012   Procedure: RIGHT TOTAL HIP ARTHROPLASTY REIMPLANTATION;  Surgeon: [name], MD;  Location: [facility];  Service: Orthopedics;  Laterality: Right;       Family History  Problem Relation Age of Onset  . Melanoma Mother   . Heart attack Mother   . Hyperlipidemia Neg Hx   . Sudden death Neg Hx   . Hypertension Neg Hx   . Diabetes Neg Hx     Social History   Tobacco Use  .  Smoking status: Never Smoker  . Smokeless tobacco: Never Used  Substance Use Topics  . Alcohol use: Yes    Comment: rare beer. 5 times a year at most. 2 beers when he drinks.  . Drug use: No    Home Medications Prior to Admission medications   Medication Sig Start Date End Date Taking? Authorizing Provider  atorvastatin (LIPITOR) 10 MG tablet TAKE 1 TABLET BY MOUTH EVERY DAY 04/16/19   Saguier, Percell Miller, PA-C  dexamethasone (DECADRON) 4 MG tablet Take 2 tablets by mouth once a day for 3 days after chemo. Take with food. 03/25/19   Tish Men, MD  guaiFENesin-dextromethorphan (ROBITUSSIN DM) 100-10 MG/5ML syrup Take 5 mLs by mouth every 4 (four) hours as needed for cough. 04/19/19   Tish Men, MD  HYDROcodone-acetaminophen (NORCO) 5-325 MG tablet Take 1-2 tablets by mouth every 6 (six) hours as needed for moderate pain or severe pain. 03/20/19   Fanny Skates, MD  lidocaine-prilocaine (EMLA) cream Apply to affected area once 03/25/19   Tish Men, MD  LORazepam (ATIVAN) 0.5 MG tablet TAKE 1 TABLET (0.5 MG TOTAL) BY MOUTH EVERY 6 (SIX) HOURS AS NEEDED (NAUSEA OR VOMITING). 04/22/19   Tish Men, MD  losartan (COZAAR) 100 MG tablet TAKE 1 TABLET BY MOUTH EVERY DAY 02/12/19   Saguier, Percell Miller, PA-C  metFORMIN (GLUCOPHAGE) 500 MG tablet TAKE 1 TABLET BY MOUTH TWICE A DAY WITH MEALS 02/12/19   Saguier, Percell Miller, PA-C  ondansetron (ZOFRAN) 8 MG tablet Take 1 tablet (8 mg total) by mouth 2 (two) times daily as needed. Start on the third day after chemotherapy. 03/25/19   Tish Men, MD  prochlorperazine (COMPAZINE) 10 MG tablet Take 1 tablet (10 mg total) by mouth every 6 (six) hours as needed (Nausea or vomiting). 03/25/19   Tish Men, MD  sildenafil (VIAGRA) 100 MG tablet Take 0.5-1 tablets (50-100 mg total) by mouth daily as needed for erectile dysfunction. 06/13/17   Saguier, Percell Miller, PA-C    Allergies    Patient has no known allergies.  Review of Systems   Review of Systems  Constitutional: Positive for  fever. Negative for chills.  HENT: Negative for ear pain and sore throat.   Eyes: Negative for pain and visual disturbance.  Respiratory: Negative for cough and shortness of breath.   Cardiovascular: Negative for chest pain and palpitations.  Gastrointestinal: Negative for abdominal pain and vomiting.  Genitourinary: Negative for dysuria and hematuria.  Musculoskeletal: Negative for arthralgias and back pain.  Skin: Negative for color change and rash.  Neurological: Negative for seizures and syncope.  All other systems reviewed and are negative.   Physical Exam Updated Vital Signs BP 114/65   Pulse 97   Temp 99.9 F (37.7 C) (Oral)   Resp (!) 31   Ht 5\' 6"  (1.676 m)   Wt 77.6 kg   SpO2 95%   BMI 27.60  kg/m   Physical Exam Vitals and nursing note reviewed.  Constitutional:      Appearance: He is well-developed.     Comments: Chronically ill-appearing, but no acute distress  HENT:     Head: Normocephalic and atraumatic.  Eyes:     Conjunctiva/sclera: Conjunctivae normal.  Cardiovascular:     Rate and Rhythm: Normal rate and regular rhythm.     Heart sounds: No murmur.  Pulmonary:     Effort: Pulmonary effort is normal. No respiratory distress.     Breath sounds: Normal breath sounds.  Abdominal:     Palpations: Abdomen is soft.     Tenderness: There is no abdominal tenderness.  Musculoskeletal:     Cervical back: Neck supple.  Skin:    General: Skin is warm and dry.     Capillary Refill: Capillary refill takes less than 2 seconds.  Neurological:     General: No focal deficit present.     Mental Status: He is alert.  Psychiatric:        Mood and Affect: Mood normal.        Behavior: Behavior normal.     ED Results / Procedures / Treatments   Labs (all labs ordered are listed, but only abnormal results are displayed) Labs Reviewed  SARS CORONAVIRUS 2 BY RT PCR ([facility]) - Abnormal; Notable for the following  components:      Result Value   SARS Coronavirus 2 POSITIVE (*)    All other components within normal limits  COMPREHENSIVE METABOLIC PANEL - Abnormal; Notable for the following components:   Sodium 127 (*)    Chloride 95 (*)    Glucose, Bld 173 (*)    Calcium 7.9 (*)    Total Protein 6.1 (*)    Albumin 2.8 (*)    All other components within normal limits  CBC WITH DIFFERENTIAL/PLATELET - Abnormal; Notable for the following components:   WBC 0.8 (*)    RBC 3.23 (*)    Hemoglobin 8.9 (*)    HCT 26.8 (*)    Neutro Abs 0.0 (*)    Lymphs Abs 0.4 (*)    All other components within normal limits  URINALYSIS, ROUTINE W REFLEX MICROSCOPIC - Abnormal; Notable for the following components:   Specific Gravity, Urine <1.005 (*)    All other components within normal limits  CULTURE, BLOOD (ROUTINE X 2)  CULTURE, BLOOD (ROUTINE X 2)  URINE CULTURE  LACTIC ACID, PLASMA  LACTIC ACID, PLASMA  PROCALCITONIN  PATHOLOGIST SMEAR REVIEW    EKG None  Radiology DG Chest Portable 1 View  Result Date: 05/16/2019 CLINICAL DATA:  Shortness of breath and fever EXAM: PORTABLE CHEST 1 VIEW COMPARISON:  April 24, 2019 FINDINGS: The heart size and mediastinal contours are unchanged. Aortic knob calcifications. A left-sided MediPort catheter seen with the tip at the superior cavoatrial junction. Mildly increased interstitial markings are again noted at both lung bases. Overlying surgical clips are seen. No acute osseous abnormality. IMPRESSION: Subsegmental atelectasis or chronic interstitial thickening at both lung bases. No acute cardiopulmonary process. Electronically Signed   By: Prudencio Pair M.D.   On: 05/16/2019 18:52    Procedures .Critical Care Performed by: Lucrezia Starch, MD Authorized by: Lucrezia Starch, MD   Critical care provider statement:    Critical care time (minutes):  45   Critical care was necessary to treat or prevent imminent or life-threatening deterioration of the  following conditions:  Sepsis  Critical care was time spent personally by me on the following activities:  Discussions with consultants, evaluation of patient's response to treatment, examination of patient, ordering and performing treatments and interventions, ordering and review of laboratory studies, ordering and review of radiographic studies, pulse oximetry, re-evaluation of patient's condition, obtaining history from patient or surrogate and review of old charts   (including critical care time)  Medications Ordered in ED Medications  ceFEPIme (MAXIPIME) 2 g in sodium chloride 0.9 % 100 mL IVPB (has no administration in time range)  vancomycin (VANCOREADY) IVPB 1250 mg/250 mL (has no administration in time range)  vancomycin (VANCOCIN) 500 MG powder (  Not Given 05/16/19 2050)  0.9 %  sodium chloride infusion ( Intravenous Stopped 05/16/19 2251)  acetaminophen (TYLENOL) tablet 650 mg (650 mg Oral Given 05/16/19 1834)  sodium chloride 0.9 % bolus 1,000 mL (0 mLs Intravenous Stopped 05/16/19 1958)  ceFEPIme (MAXIPIME) 2 g in sodium chloride 0.9 % 100 mL IVPB ( Intravenous Stopped 05/16/19 1955)  vancomycin (VANCOCIN) IVPB 1000 mg/200 mL premix (0 mg Intravenous Stopped 05/16/19 2029)  vancomycin (VANCOREADY) IVPB 500 mg/100 mL ( Intravenous Stopped 05/16/19 2215)  metroNIDAZOLE (FLAGYL) IVPB 500 mg ( Intravenous Stopped 05/16/19 2246)    ED Course  I have reviewed the triage vital signs and the nursing notes.  Pertinent labs & imaging results that were available during my care of the patient were reviewed by me and considered in my medical decision making (see chart for details).  Clinical Course as of May 15 2329  Thu May 16, 2019  1843 Completed initial assessment, recent chemo, high concern for neutropenic fever, will give broad spectrum abx, closely monitor   [RD]  2030 Discussed with oncology, they will see in consultation, agree with broad spectrum abx for now   [RD]  2042 SARS Coronavirus  2(!): POSITIVE [RD]  2102 D/w hosp who will accept   [RD]    Clinical Course User Index [RD] Lucrezia Starch, MD   MDM Rules/Calculators/A&P                      64 year old male with notable history of Hodgkin's lymphoma on chemotherapy presenting to ER with fever.  Here patient was well-appearing, no distress, no acute infectious symptoms though he has had chronic cough.  Concern for possible neutropenic fever given recent chemotherapy, provided broad-spectrum antibiotics, obtain blood cultures and [facility] lab work testing.  CBC consistent with neutropenia.  Mild drop in hemoglobin but otherwise stable blood counts.  Covid test was positive. No hypoxia. Will be admitted to hospitalist service for further management.  Reviewed with Dr. Marin Olp, oncology team will see patient once admitted.  Final Clinical Impression(s) / ED Diagnoses Final diagnoses:  COVID-19  Neutropenic fever ([place])    Rx / DC Orders ED Discharge Orders    None       Lucrezia Starch, MD 05/16/19 [phone]

## 2019-05-16 NOTE — Progress Notes (Signed)
Pharmacy Antibiotic Note  Steve [name] is a 64 y.o. male admitted on 05/16/2019 with persistent fever and suspected infection with unknown source.  Pharmacy has been consulted for vancomycin and cefepime dosing.  Plan: Vancomycin 1500 mg IV x 1, then 1250 mg IV every 24 hours Goal AUC 400-550. Expected AUC: 492 SCr used: 0.92 Cefepime 2g IV every 8 hours Monitor renal function, Cx and clinical progression to narrow Vancomycin levels at steady state  Height: 5\' 6"  (167.6 cm) Weight: 171 lb (77.6 kg) IBW/kg (Calculated) : 63.8  Temp (24hrs), Avg:102.7 F (39.3 C), Min:102.7 F (39.3 C), Max:102.7 F (39.3 C)  No results for input(s): WBC, CREATININE, LATICACIDVEN, VANCOTROUGH, VANCOPEAK, VANCORANDOM, GENTTROUGH, GENTPEAK, GENTRANDOM, TOBRATROUGH, TOBRAPEAK, TOBRARND, AMIKACINPEAK, AMIKACINTROU, AMIKACIN in the last 168 hours.  Estimated Creatinine Clearance: 79.7 mL/min (by C-G formula based on SCr of 0.93 mg/dL).    No Known Allergies  Bertis Ruddy, PharmD Clinical Pharmacist Please check AMION for all [place] numbers 05/16/2019 6:45 PM

## 2019-05-16 NOTE — Progress Notes (Signed)
[name], Steve [name] (DOB 07/10/1955)  I discussed the following case with Dr. Roslynn Amble (emergency department physician at [facility]), who contacted the call-center requesting transfer of the above patient from [facility] ED to [facility] for further work-up and management of neutropenic fever in the setting of COVID-19, with necessity for transfer on the basis of need for higher level of care and availability of specialist providers, including oncology.    Steve [name], Steve [name] is a 64 year old male with medical history notable for Hodgkin's lymphoma undergoing chemotherapy, who presented to [facility] ED on 05/16/2019 complaining of 1 day of objective fever.  He also reported new onset cough over the last few days.  Underwent most recent chemotherapy 9 days ago.   Vital signs in [facility] ED were notable for the following: Presenting temperature 102.7, mild tachycardia with heart rates in the low 100s, respiratory rate 23-20, and oxygen saturation 94 to 96% on room air.  Blood pressure 112/59.  CBC revealed absolute neutropenia, and COVID-19 by PCR performed in the ED this evening was found to be positive.  Dr. Roslynn Amble discussed the patient's case with the on-call oncologist, Dr. Marin Olp, who recommended admission to the hospitalist service at [facility] for further work-up and management of presenting neutropenic fever. Dr. Marin Olp reportedly will see the patient in the morning, and recommended interval initiation of broad-spectrum antibiotics.   Medications administered prior to transfer included the following: IV vancomycin x1, cefepime 2 g IV x1, Flagyl 500 mg IV x1, and a 1 L IV normal saline bolus.    Babs Bertin, DO Hospitalist

## 2019-05-16 NOTE — ED Triage Notes (Signed)
Cough 10 weeks with fever today. Hx of cancer.

## 2019-05-16 NOTE — ED Notes (Signed)
ED Provider at bedside. 

## 2019-05-16 NOTE — ED Notes (Signed)
Date and time results received: 05/16/19 1816 (use smartphrase ".now" to insert current time)  Test: WBC Critical Value: 0.8  Name of Provider Notified: Dr. Roslynn Amble  Orders Received? Or Actions Taken?: no new orders

## 2019-05-16 NOTE — ED Notes (Signed)
Date and time results received: 05/16/19 2040 (use smartphrase ".now" to insert current time)  Test: covid Critical Value: positive  Name of Provider Notified: Dr. Roslynn Amble  Orders Received? Or Actions Taken?: no new orders

## 2019-05-17 ENCOUNTER — Inpatient Hospital Stay (HOSPITAL_COMMUNITY): Payer: BC Managed Care – PPO

## 2019-05-17 DIAGNOSIS — D638 Anemia in other chronic diseases classified elsewhere: Secondary | ICD-10-CM | POA: Diagnosis present

## 2019-05-17 DIAGNOSIS — D649 Anemia, unspecified: Secondary | ICD-10-CM | POA: Diagnosis present

## 2019-05-17 DIAGNOSIS — E43 Unspecified severe protein-calorie malnutrition: Secondary | ICD-10-CM | POA: Diagnosis present

## 2019-05-17 DIAGNOSIS — C8118 Nodular sclerosis classical Hodgkin lymphoma, lymph nodes of multiple sites: Secondary | ICD-10-CM

## 2019-05-17 DIAGNOSIS — D709 Neutropenia, unspecified: Secondary | ICD-10-CM | POA: Diagnosis present

## 2019-05-17 DIAGNOSIS — E871 Hypo-osmolality and hyponatremia: Secondary | ICD-10-CM | POA: Diagnosis present

## 2019-05-17 DIAGNOSIS — U071 COVID-19: Secondary | ICD-10-CM

## 2019-05-17 LAB — IRON AND TIBC
Iron: 19 ug/dL — ABNORMAL LOW (ref 45–182)
Saturation Ratios: 12 % — ABNORMAL LOW (ref 17.9–39.5)
TIBC: 164 ug/dL — ABNORMAL LOW (ref 250–450)
UIBC: 145 ug/dL

## 2019-05-17 LAB — CBC WITH DIFFERENTIAL/PLATELET
Abs Immature Granulocytes: 0 10*3/uL (ref 0.00–0.07)
Basophils Absolute: 0 10*3/uL (ref 0.0–0.1)
Basophils Relative: 0 %
Eosinophils Absolute: 0 10*3/uL (ref 0.0–0.5)
Eosinophils Relative: 0 %
HCT: 27.6 % — ABNORMAL LOW (ref 39.0–52.0)
Hemoglobin: 9 g/dL — ABNORMAL LOW (ref 13.0–17.0)
Immature Granulocytes: 0 %
Lymphocytes Relative: 62 %
Lymphs Abs: 0.3 10*3/uL — ABNORMAL LOW (ref 0.7–4.0)
MCH: 27.4 pg (ref 26.0–34.0)
MCHC: 32.6 g/dL (ref 30.0–36.0)
MCV: 83.9 fL (ref 80.0–100.0)
Monocytes Absolute: 0.1 10*3/uL (ref 0.1–1.0)
Monocytes Relative: 23 %
Neutro Abs: 0.1 10*3/uL — ABNORMAL LOW (ref 1.7–7.7)
Neutrophils Relative %: 15 %
Platelets: 234 10*3/uL (ref 150–400)
RBC: 3.29 MIL/uL — ABNORMAL LOW (ref 4.22–5.81)
RDW: 13.9 % (ref 11.5–15.5)
WBC: 0.5 10*3/uL — CL (ref 4.0–10.5)
nRBC: 0 % (ref 0.0–0.2)

## 2019-05-17 LAB — FOLATE: Folate: 11.4 ng/mL (ref >5.9)

## 2019-05-17 LAB — COMPREHENSIVE METABOLIC PANEL
ALT: 31 U/L (ref 0–44)
AST: 21 U/L (ref 15–41)
Albumin: 2.7 g/dL — ABNORMAL LOW (ref 3.5–5.0)
Alkaline Phosphatase: 51 U/L (ref 38–126)
Anion gap: 9 (ref 5–15)
BUN: 15 mg/dL (ref 8–23)
CO2: 22 mmol/L (ref 22–32)
Calcium: 8.3 mg/dL — ABNORMAL LOW (ref 8.9–10.3)
Chloride: 102 mmol/L (ref 98–111)
Creatinine, Ser: 0.81 mg/dL (ref 0.61–1.24)
GFR calc Af Amer: >60 mL/min (ref >60)
GFR calc non Af Amer: >60 mL/min (ref >60)
Glucose, Bld: 313 mg/dL — ABNORMAL HIGH (ref 70–99)
Potassium: 3.9 mmol/L (ref 3.5–5.1)
Sodium: 133 mmol/L — ABNORMAL LOW (ref 135–145)
Total Bilirubin: 0.6 mg/dL (ref 0.3–1.2)
Total Protein: 6.4 g/dL — ABNORMAL LOW (ref 6.5–8.1)

## 2019-05-17 LAB — URINE CULTURE: Culture: NO GROWTH

## 2019-05-17 LAB — TYPE AND SCREEN
ABO/RH(D): A NEG
Antibody Screen: NEGATIVE

## 2019-05-17 LAB — PATHOLOGIST SMEAR REVIEW

## 2019-05-17 LAB — GLUCOSE, CAPILLARY
Glucose-Capillary: 181 mg/dL — ABNORMAL HIGH (ref 70–99)
Glucose-Capillary: 223 mg/dL — ABNORMAL HIGH (ref 70–99)
Glucose-Capillary: 273 mg/dL — ABNORMAL HIGH (ref 70–99)
Glucose-Capillary: 257 mg/dL — ABNORMAL HIGH (ref 70–99)

## 2019-05-17 LAB — PROCALCITONIN: Procalcitonin: 0.8 ng/mL

## 2019-05-17 LAB — FIBRINOGEN: Fibrinogen: 565 mg/dL — ABNORMAL HIGH (ref 210–475)

## 2019-05-17 LAB — LACTATE DEHYDROGENASE: LDH: 143 U/L (ref 98–192)

## 2019-05-17 LAB — BRAIN NATRIURETIC PEPTIDE: B Natriuretic Peptide: 67.7 pg/mL (ref 0.0–100.0)

## 2019-05-17 LAB — C-REACTIVE PROTEIN: CRP: 17.1 mg/dL — ABNORMAL HIGH (ref <1.0)

## 2019-05-17 LAB — HEPATITIS B SURFACE ANTIGEN: Hepatitis B Surface Ag: NONREACTIVE

## 2019-05-17 LAB — D-DIMER, QUANTITATIVE: D-Dimer, Quant: 1.4 ug/mL-FEU — ABNORMAL HIGH (ref 0.00–0.50)

## 2019-05-17 LAB — TROPONIN I (HIGH SENSITIVITY)
Troponin I (High Sensitivity): 11 ng/L (ref <18)
Troponin I (High Sensitivity): 10 ng/L (ref <18)

## 2019-05-17 LAB — FERRITIN: Ferritin: 1441 ng/mL — ABNORMAL HIGH (ref 24–336)

## 2019-05-17 LAB — ABO/RH: ABO/RH(D): A NEG

## 2019-05-17 LAB — HIV ANTIBODY (ROUTINE TESTING W REFLEX): HIV Screen 4th Generation wRfx: NONREACTIVE

## 2019-05-17 LAB — VITAMIN B12: Vitamin B-12: 1380 pg/mL — ABNORMAL HIGH (ref 180–914)

## 2019-05-17 MED ORDER — PROCHLORPERAZINE MALEATE 10 MG PO TABS
10.0000 mg | ORAL_TABLET | Freq: Four times a day (QID) | ORAL | Status: DC | PRN
  Filled 2019-05-17: qty 1

## 2019-05-17 MED ORDER — INSULIN ASPART 100 UNIT/ML ~~LOC~~ SOLN
0.0000 [IU] | Freq: Every day | SUBCUTANEOUS | Status: DC
  Administered 2019-05-17: 22:00:00 3 [IU] via SUBCUTANEOUS

## 2019-05-17 MED ORDER — ACETAMINOPHEN 500 MG PO TABS
1000.0000 mg | ORAL_TABLET | Freq: Once | ORAL | Status: AC
  Administered 2019-05-17: 02:00:00 1000 mg via ORAL
  Filled 2019-05-17: qty 2

## 2019-05-17 MED ORDER — DEXAMETHASONE SODIUM PHOSPHATE 10 MG/ML IJ SOLN
6.0000 mg | Freq: Every day | INTRAMUSCULAR | Status: DC
  Administered 2019-05-17 – 2019-05-22 (×6): 6 mg via INTRAVENOUS
  Filled 2019-05-17 (×7): qty 1

## 2019-05-17 MED ORDER — INSULIN ASPART 100 UNIT/ML ~~LOC~~ SOLN
0.0000 [IU] | Freq: Three times a day (TID) | SUBCUTANEOUS | Status: DC
  Administered 2019-05-17: 13:00:00 3 [IU] via SUBCUTANEOUS
  Administered 2019-05-17: 08:00:00 2 [IU] via SUBCUTANEOUS
  Administered 2019-05-17 – 2019-05-18 (×2): 5 [IU] via SUBCUTANEOUS
  Administered 2019-05-18: 2 [IU] via SUBCUTANEOUS

## 2019-05-17 MED ORDER — ENSURE ENLIVE PO LIQD
237.0000 mL | Freq: Two times a day (BID) | ORAL | Status: DC
  Administered 2019-05-17 – 2019-05-22 (×9): 237 mL via ORAL

## 2019-05-17 MED ORDER — LORAZEPAM 0.5 MG PO TABS: 0.5000 mg | ORAL_TABLET | Freq: Four times a day (QID) | ORAL | Status: DC | PRN

## 2019-05-17 MED ORDER — SODIUM CHLORIDE 0.9 % IV SOLN: INTRAVENOUS | Status: AC

## 2019-05-17 MED ORDER — ENOXAPARIN SODIUM 40 MG/0.4ML ~~LOC~~ SOLN
40.0000 mg | Freq: Every day | SUBCUTANEOUS | Status: DC
  Administered 2019-05-17 – 2019-05-22 (×6): 40 mg via SUBCUTANEOUS
  Filled 2019-05-17 (×6): qty 0.4

## 2019-05-17 MED ORDER — SODIUM CHLORIDE 0.9 % IV SOLN
100.0000 mg | Freq: Every day | INTRAVENOUS | Status: AC
  Administered 2019-05-18 – 2019-05-21 (×4): 100 mg via INTRAVENOUS
  Filled 2019-05-17 (×4): qty 20

## 2019-05-17 MED ORDER — LOSARTAN POTASSIUM 25 MG PO TABS
100.0000 mg | ORAL_TABLET | Freq: Every day | ORAL | Status: DC
  Administered 2019-05-18: 100 mg via ORAL
  Filled 2019-05-17: qty 2

## 2019-05-17 MED ORDER — VITAMIN C 500 MG/5ML PO SYRP
500.0000 mg | ORAL_SOLUTION | Freq: Every day | ORAL | Status: DC
  Administered 2019-05-17 – 2019-05-22 (×6): 500 mg via ORAL
  Filled 2019-05-17 (×6): qty 5

## 2019-05-17 MED ORDER — ACETAMINOPHEN 325 MG PO TABS: 650.0000 mg | ORAL_TABLET | Freq: Four times a day (QID) | ORAL | Status: DC | PRN

## 2019-05-17 MED ORDER — SODIUM CHLORIDE 0.9 % IV SOLN: INTRAVENOUS | Status: DC

## 2019-05-17 MED ORDER — ATORVASTATIN CALCIUM 10 MG PO TABS
10.0000 mg | ORAL_TABLET | Freq: Every day | ORAL | Status: DC
  Administered 2019-05-17 – 2019-05-22 (×6): 10 mg via ORAL
  Filled 2019-05-17 (×6): qty 1

## 2019-05-17 MED ORDER — GUAIFENESIN-DM 100-10 MG/5ML PO SYRP: 5.0000 mL | ORAL_SOLUTION | ORAL | Status: DC | PRN

## 2019-05-17 MED ORDER — PRO-STAT SUGAR FREE PO LIQD
30.0000 mL | Freq: Two times a day (BID) | ORAL | Status: DC
  Administered 2019-05-17 – 2019-05-22 (×10): 30 mL via ORAL
  Filled 2019-05-17 (×11): qty 30

## 2019-05-17 MED ORDER — SODIUM CHLORIDE (PF) 0.9 % IJ SOLN
INTRAMUSCULAR | Status: AC
  Filled 2019-05-17: qty 50

## 2019-05-17 MED ORDER — SODIUM CHLORIDE 0.9 % IV SOLN
200.0000 mg | Freq: Once | INTRAVENOUS | Status: AC
  Administered 2019-05-17: 05:00:00 200 mg via INTRAVENOUS
  Filled 2019-05-17: qty 200

## 2019-05-17 MED ORDER — ZINC SULFATE 220 (50 ZN) MG PO CAPS
220.0000 mg | ORAL_CAPSULE | Freq: Every day | ORAL | Status: DC
  Administered 2019-05-17 – 2019-05-22 (×6): 220 mg via ORAL
  Filled 2019-05-17 (×6): qty 1

## 2019-05-17 MED ORDER — IOHEXOL 350 MG/ML SOLN
100.0000 mL | Freq: Once | INTRAVENOUS | Status: AC | PRN
  Administered 2019-05-17: 10:00:00 100 mL via INTRAVENOUS

## 2019-05-17 NOTE — Progress Notes (Signed)
PROGRESS NOTE  Steve [name] BZJ:[id number] DOB: May 29, 1955 DOA: 05/16/2019 PCP: [name], PA-C  HPI/Recap of past 24 hours: HPI from Dr [name]  is a 64 y.o. male, w Hodgkins lymphoma, HTN, DM2, OSA, apparently presents with fever (103F). Pt was told by oncology to go to ED. Pt notes slight dry cough since started chemo, as well as diarrhea. Pt notes alteration in sense of taste and smell. His last chemo was 9 days ago, PTA. In the ED, T 102.7 F, P 118, R 26, BP 98/62  Pox 92% on Ra. Labs showed sodium 127, lactic acid 1.3, procalcitonin 0.65, WBC 0.8, hemoglobin 8.9, urinalysis negative, COVID-19 PCR positive, chest x-ray showed subsegmental atelectasis or chronic interstitial thickening at both lung bases.  EDP spoke to oncologist, who recommended inpatient admission and follow-up.  Patient admitted for further management.    Today, patient noted feeling better, currently not requiring any oxygen, denies any worsening shortness of breath, still reports some cough, denies any chest pain, nausea/vomiting, abdominal pain, fever/chills.     Assessment/Plan: Principal Problem:   Neutropenic fever ([name]) Active Problems:   Hodgkin lymphoma ([name])   Febrile neutropenia ([name])   COVID-19   Anemia   Hyponatremia   Protein-calorie malnutrition, severe ([name])   COVID-19 virus infection Currently saturating around 92 on room air Currently afebrile, with neutropenia Inflammatory markers elevated, will trend Procalcitonin 0.65--> 0.80 LA 1.2 Chest x-ray unremarkable for any acute process Continue remdesivir, Decadron Supplemental oxygen as needed, cough suppressants as needed Monitor closely  Neutropenic fever Fever may be likely 2/2 above, rule out sepsis  Procalcitonin elevated Urinalysis negative, UC pending BC x2 pending Chest x-ray unremarkable Continue cefepime, vancomycin Monitor closely  Hyponatremia Likely 2/2 dehydration Continue gentle IV  hydration Daily BMP  Acute on chronic anemia of chronic disease/chemotherapy-induced Baseline hemoglobin around 11-12, currently 8.9 No evidence of bleeding Likely chemo induced Anemia panel, type and screen pending Daily CBC  Chemotherapy associated diarrhea Imodium as needed Monitor closely  Stage III Hodgkin's lymphoma Follows oncology Dr.Zhao, diagnosed around 02/2019 Currently on chemotherapy Dr. Maylon Peppers put on the treatment team        Malnutrition Type:  Nutrition Problem: Increased nutrient needs Etiology: cancer and cancer related treatments, acute illness(COVID-19 infection)   Malnutrition Characteristics:  Signs/Symptoms: estimated needs   Nutrition Interventions:  Interventions: Ensure Enlive (each supplement provides 350kcal and 20 grams of protein), MVI, Magic cup    Estimated body mass index is 27.6 kg/m as calculated from the following:   Height as of this encounter: 5\' 6"  (1.676 m).   Weight as of this encounter: 77.6 kg.     Code Status: Full  Family Communication: None at bedside  Disposition Plan: Patient came from home, work-up still pending, plan for DC likely back to home in 24 to 48 hours pending clinical improvement   Consultants:  None  Procedures:  None  Antimicrobials:  Cefepime  Vancomycin  DVT prophylaxis: Lovenox   Objective: Vitals:   05/17/19 0303 05/17/19 0351 05/17/19 0613 05/17/19 1347  BP:  103/63  100/67  Pulse:  94  92  Resp:  20  16  Temp: 99.8 F (37.7 C) 98.7 F (37.1 C) 98.4 F (36.9 C) 97.7 F (36.5 C)  TempSrc: Oral Oral  Oral  SpO2:  95%  93%  Weight:      Height:        Intake/Output Summary (Last 24 hours) at 05/17/2019 1555 Last data filed at 05/17/2019 1502  Gross per 24 hour  Intake 1579.46 ml  Output 975 ml  Net 604.46 ml   Filed Weights   05/16/19 1819  Weight: 77.6 kg    Exam:  General: NAD, chronically ill-appearing  Cardiovascular: S1, S2 present  Respiratory:   Diminished breath sounds bilaterally  Abdomen: Soft, nontender, nondistended, bowel sounds present  Musculoskeletal: No bilateral pedal edema noted  Skin: Normal  Psychiatry: Normal mood    Data Reviewed: CBC: Recent Labs  Lab 05/16/19 1857  WBC 0.8*  NEUTROABS 0.0*  HGB 8.9*  HCT 26.8*  MCV 83.0  PLT 622   Basic Metabolic Panel: Recent Labs  Lab 05/16/19 1857  NA 127*  K 3.6  CL 95*  CO2 23  GLUCOSE 173*  BUN 10  CREATININE 0.92  CALCIUM 7.9*   GFR: Estimated Creatinine Clearance: 80.6 mL/min (by C-G formula based on SCr of 0.92 mg/dL). Liver Function Tests: Recent Labs  Lab 05/16/19 1857  AST 18  ALT 30  ALKPHOS 47  BILITOT 0.9  PROT 6.1*  ALBUMIN 2.8*   No results for input(s): LIPASE, AMYLASE in the last 168 hours. No results for input(s): AMMONIA in the last 168 hours. Coagulation Profile: No results for input(s): INR, PROTIME in the last 168 hours. Cardiac Enzymes: No results for input(s): CKTOTAL, CKMB, CKMBINDEX, TROPONINI in the last 168 hours. BNP (last 3 results) No results for input(s): PROBNP in the last 8760 hours. HbA1C: No results for input(s): HGBA1C in the last 72 hours. CBG: Recent Labs  Lab 05/17/19 0743 05/17/19 1135  GLUCAP 181* 223*   Lipid Profile: No results for input(s): CHOL, HDL, LDLCALC, TRIG, CHOLHDL, LDLDIRECT in the last 72 hours. Thyroid Function Tests: No results for input(s): TSH, T4TOTAL, FREET4, T3FREE, THYROIDAB in the last 72 hours. Anemia Panel: Recent Labs    05/17/19 0517  FERRITIN 1,441*   Urine analysis:    Component Value Date/Time   COLORURINE YELLOW 05/16/2019 1857   APPEARANCEUR CLEAR 05/16/2019 1857   LABSPEC <1.005 (L) 05/16/2019 1857   PHURINE 6.5 05/16/2019 1857   GLUCOSEU NEGATIVE 05/16/2019 1857   HGBUR NEGATIVE 05/16/2019 1857   HGBUR negative 03/23/2007 0000   BILIRUBINUR NEGATIVE 05/16/2019 1857   BILIRUBINUR neg 11/11/2016 1017   KETONESUR NEGATIVE 05/16/2019 1857    PROTEINUR NEGATIVE 05/16/2019 1857   UROBILINOGEN 0.2 11/11/2016 1017   UROBILINOGEN 0.2 11/08/2012 0915   NITRITE NEGATIVE 05/16/2019 1857   LEUKOCYTESUR NEGATIVE 05/16/2019 1857   Sepsis Labs: @LABRCNTIP (procalcitonin:4,lacticidven:4)  ) Recent Results (from the past 240 hour(s))  SARS Coronavirus 2 by RT PCR (hospital order, performed in [facility] hospital lab) Nasopharyngeal Nasopharyngeal Swab     Status: Abnormal   Collection Time: 05/16/19  6:57 PM   Specimen: Nasopharyngeal Swab  Result Value Ref Range Status   SARS Coronavirus 2 POSITIVE (A) NEGATIVE Final    Comment: RESULT CALLED TO, READ BACK BY AND VERIFIED WITH: Patsy Baltimore RN @2039  05/16/2019 OLSONM Performed at [facility], [address] [place], [place] [address]   Blood Culture (routine x 2)     Status: None (Preliminary result)   Collection Time: 05/16/19  6:58 PM   Specimen: BLOOD  Result Value Ref Range Status   Specimen Description   Final    BLOOD LEFT ANTECUBITAL Performed at [facility], [address] [place], [place] [address]    Special Requests   Final    BOTTLES DRAWN AEROBIC AND ANAEROBIC Blood Culture adequate volume Performed  at [facility], [address] [place], [place] [address]    Culture   Final    NO GROWTH < 24 HOURS Performed at [facility], [place] [address], [place], [place] [address]    Report Status PENDING  Incomplete  Blood Culture (routine x 2)     Status: None (Preliminary result)   Collection Time: 05/16/19  6:58 PM   Specimen: BLOOD  Result Value Ref Range Status   Specimen Description   Final    BLOOD RIGHT ANTECUBITAL Performed at [facility], [address] [place], [place] [address]    Special Requests   Final    BOTTLES DRAWN AEROBIC AND ANAEROBIC Blood Culture results may not be optimal due to an excessive volume of blood received in culture bottles Performed at [facility], [address] [place], [place] [address]    Culture   Final    NO GROWTH < 24 HOURS Performed at [facility], [place] [address] [place], [place] [address]    Report Status PENDING  Incomplete      Studies: CT ANGIO CHEST PE W OR WO CONTRAST  Result Date: 05/17/2019 CLINICAL DATA:  PE suspected. History of Hodgkin's lymphoma EXAM: CT ANGIOGRAPHY CHEST WITH CONTRAST TECHNIQUE: Multidetector CT imaging of the chest was performed using the standard protocol during bolus administration of intravenous contrast. Multiplanar CT image reconstructions and MIPs were obtained to evaluate the vascular anatomy. CONTRAST:  164mL OMNIPAQUE IOHEXOL 350 MG/ML SOLN COMPARISON:  02/28/2019 FINDINGS: Cardiovascular: Heart size upper limits of normal unchanged. No pericardial effusion. Aortic caliber with mild dilation of the ascending thoracic aorta approximately 3.2 cm. Central pulmonary vasculature mildly dilated moderately dilated at 3 cm max caliber. No signs of pulmonary embolism. Mildly limited assessment due to bolus timing and respiratory motion. Left sided Port-A-Cath terminates at the caval to atrial junction. Mediastinum/Nodes: Bulky mediastinal lymphadenopathy is diminished in size. Right paratracheal lymph node 3 x 2.6 cm previously 5.8 x 4.7 cm. Other lymph nodes with similar decrease in size. No signs of axillary adenopathy. Lungs/Pleura: Multiple peripheral and nodular opacities with ground-glass and septal thickening have developed since the study of November of 2020 where there was some mild subpleural reticulation. No signs of dense consolidative change of the lobar distribution. Upper Abdomen: Imaged portions of liver, spleen, adrenal glands and upper portions of the kidneys are unremarkable. Spleen is incompletely imaged as are other upper abdominal viscera. Musculoskeletal: No signs of chest wall lesion. No acute or destructive bone process. Review of the MIP images confirms the above findings. IMPRESSION: 1. No  signs of pulmonary embolism. 2. Interval development of multiple peripheral and nodular opacities with ground-glass and septal thickening. Findings of atypical infection compatible with history of COVID-19. 3. Interval decrease in size of bulky mediastinal lymphadenopathy. 4. Mild dilation of the ascending thoracic aorta to 3.2 cm max caliber. Recommend annual imaging followup by CTA or MRA. This recommendation follows 2010 ACCF/AHA/AATS/ACR/ASA/SCA/SCAI/SIR/STS/SVM Guidelines for the Diagnosis and Management of Patients with Thoracic Aortic Disease. Circulation.2010; 121: M426-S341. Aortic aneurysm NOS (ICD10-I71.9) 5. Mild central pulmonary arterial dilatation suggestive of pulmonary arterial hypertension. 6. Left-sided Port-A-Cath terminates at the caval to atrial junction. Electronically Signed   By: Zetta Bills M.D.   On: 05/17/2019 10:49   DG Chest Portable 1 View  Result Date: 05/16/2019 CLINICAL DATA:  Shortness of breath and fever EXAM: PORTABLE CHEST 1 VIEW COMPARISON:  April 24, 2019  FINDINGS: The heart size and mediastinal contours are unchanged. Aortic knob calcifications. A left-sided MediPort catheter seen with the tip at the superior cavoatrial junction. Mildly increased interstitial markings are again noted at both lung bases. Overlying surgical clips are seen. No acute osseous abnormality. IMPRESSION: Subsegmental atelectasis or chronic interstitial thickening at both lung bases. No acute cardiopulmonary process. Electronically Signed   By: Prudencio Pair M.D.   On: 05/16/2019 18:52    Scheduled Meds: . dexamethasone (DECADRON) injection  6 mg Intravenous Daily  . enoxaparin (LOVENOX) injection  40 mg Subcutaneous Daily  . feeding supplement (ENSURE ENLIVE)  237 mL Oral BID BM  . feeding supplement (PRO-STAT SUGAR FREE 64)  30 mL Oral BID  . insulin aspart  0-5 Units Subcutaneous QHS  . insulin aspart  0-9 Units Subcutaneous TID WC    Continuous Infusions: . sodium chloride  Stopped (05/16/19 2251)  . sodium chloride 50 mL/hr at 05/17/19 0601  . ceFEPime (MAXIPIME) IV 2 g (05/17/19 1358)  . [START ON 05/18/2019] remdesivir 100 mg in NS 100 mL    . vancomycin       LOS: 1 day     Alma Friendly, MD [facility]  If 7PM-7AM, please contact night-coverage [email or web address] 05/17/2019, 3:55 PM

## 2019-05-17 NOTE — Progress Notes (Signed)
PHARMACY - PHYSICIAN COMMUNICATION CRITICAL VALUE ALERT - BLOOD CULTURE IDENTIFICATION (BCID)  Steve [name] is an 64 y.o. male who presented to [facility] on 05/16/2019 with a chief complaint of fever & cough  1 of 4 bottles, aerobic: gram positive cocci. . Assessment:  neutropenic fever  & Covid 19 Name of physician (or Provider) Contacted: Jeannette Corpus, APP via AMION text   Current antibiotics: vancomycin, cefepime, remdesivir  Changes to prescribed antibiotics recommended:  Patient is on recommended antibiotics - No changes needed  No results found for this or any previous visit.  Eudelia Bunch, Pharm.D [phone] 05/17/2019 7:35 PM

## 2019-05-17 NOTE — H&P (Signed)
TRH H&P    Patient Demographics:    Steve [name], is a 64 y.o. male  MRN: [id number]  DOB - 12-01-1955  Admit Date - 05/16/2019  Referring MD/NP/PA:  Madalyn Rob  Outpatient Primary MD for the patient is Saguier, Percell Miller, PA-C  Patient coming from:  home  Chief complaint- fever   HPI:    Herny Scurlock  is a 64 y.o. male,  w Hodgkins lymphoma, hypertension, DM2, OSA, apparently presents with fever to 103,  Pt was told by oncology to go to ED.  Pt notes slight dry cough, has had since started chemo, and also diarrhea, which he has had since starting chemo.  Neither are worse than his baseline.  Pt notes alteration in sense of taste and smell.  His last chemo was 9 days ago.   Pt notes that he didn't get full dose of neulasta at the end of last chemo per his wife.    Pt denies cp, palp, sob, n/v, abd pain, brbpr, dysuria, hematuria.    In ED,  T 102.7, P 118, R 26, Bp 98/62  Pox 92% on Ra Wt 77.6kg   CXR IMPRESSION: Subsegmental atelectasis or chronic interstitial thickening at both lung bases.  No acute cardiopulmonary process.  Na 127, K 3.6, Bun 10, Creatinine 0.92 Alb 2.8 Ast 18, Alt 30 Urinalysis negative Lactic acid 1.2 Procalcitonin 0.65 Sars corvid pcr positive   Wbc 0.8, Hgb 8.9, Plt 213   Per Dr. Velia Meyer: Dr. Roslynn Amble discussed the patient's case with the on-call oncologist, Dr. Marin Olp, who recommended admission to the hospitalist service at [facility] for further work-up and management of presenting neutropenic fever. Dr. Marin Olp reportedly will see the patient in the morning, and recommended interval initiation of broad-spectrum antibiotics.   Medications administered prior to transfer included the following: IV vancomycin x1, cefepime 2 g IV x1, Flagyl 500 mg IV x1, and a 1 L IV normal saline bolus.  Pt will be admitted for febrile neutropenia and covid-19.       Review of systems:    In addition to the HPI above,  No Fever-chills, No Headache, No changes with Vision or hearing, No problems swallowing food or Liquids, No Chest pain, Cough or Shortness of Breath, No Abdominal pain, No Nausea or Vomiting, bowel movements are regular, No Blood in stool or Urine, No dysuria, No new skin rashes or bruises, No new joints pains-aches,  No new weakness, tingling, numbness in any extremity, No recent weight gain or loss, No polyuria, polydypsia or polyphagia, No significant Mental Stressors.  All other systems reviewed and are negative.    Past History of the following :    Past Medical History:  Diagnosis Date  . Abscess of muscle 08/10/2011   staph infection of right hip   . Diabetes mellitus without complication ([place])   . GERD (gastroesophageal reflux disease)    rare reflux - no meds for reflux - NO PROBLEM IN PAST SEVERAL YRS  . Hip dysplasia, congenital    no surgery as a child  for hip dysplasia - has had bilateral hip replacements as an adult  . Hodgkin lymphoma ([place])   . Hypertension   . Pancreatitis   . Postoperative anemia due to acute blood loss 09/07/2012  . Septic arthritis of hip ([place]) 09/05/2012   PT'S TOTAL HIP JOINT REMOVED - ANTIBIOTIC SPACE PLACED AND PT HAS FINISHED IV ANTIBIOTICS ( PICC LINE REMOVED)  . Sleep apnea    USES CPAP      Past Surgical History:  Procedure Laterality Date  . COLONOSCOPY  04/26/2007  . HERNIA REPAIR     inguinal hernia x3  . IR IMAGING GUIDED PORT INSERTION  03/29/2019  . JOINT REPLACEMENT  2002 & 2007   bilateral hip replacement  . LYMPH NODE BIOPSY Right 03/20/2019   Procedure: DEEP RIGHT SUPRACLAVICULAR LYMPH NODE EXCISION;  Surgeon: Fanny Skates, MD;  Location: [facility];  Service: General;  Laterality: Right;  . MULTIPLE EXTRACTIONS WITH ALVEOLOPLASTY  07/28/2011   Procedure: MULTIPLE EXTRACION WITH ALVEOLOPLASTY;  Surgeon: Lenn Cal, DDS;  Location: [facility];  Service: Oral Surgery;  Laterality: N/A;  Extraction of tooth #'s 2,3,4,5,6,11,12,13,15,19,22 with alveoloplasty.  . shoulder repair - right for separation of shoulder    . TEE WITHOUT CARDIOVERSION  07/29/2011   Procedure: TRANSESOPHAGEAL ECHOCARDIOGRAM (TEE);  Surgeon: Josue Hector, MD;  Location: [facility];  Service: Cardiovascular;  Laterality: N/A;  . TOTAL HIP REVISION Right 09/05/2012   Procedure: RIGHT HIP RESECTION ARTHROPLASTY WITH ANTIBIOTIC SPACERS;  Surgeon: [name], MD;  Location: [facility];  Service: Orthopedics;  Laterality: Right;  . TOTAL HIP REVISION Right 11/30/2012   Procedure: RIGHT TOTAL HIP ARTHROPLASTY REIMPLANTATION;  Surgeon: [name], MD;  Location: [facility];  Service: Orthopedics;  Laterality: Right;      Social History:      Social History   Tobacco Use  . Smoking status: Never Smoker  . Smokeless tobacco: Never Used  Substance Use Topics  . Alcohol use: Yes    Comment: rare beer. 5 times a year at most. 2 beers when he drinks.       Family History :     Family History  Problem Relation Age of Onset  . Melanoma Mother   . Heart attack Mother   . Hyperlipidemia Neg Hx   . Sudden death Neg Hx   . Hypertension Neg Hx   . Diabetes Neg Hx       Home Medications:   Prior to Admission medications   Medication Sig Start Date End Date Taking? Authorizing Provider  atorvastatin (LIPITOR) 10 MG tablet TAKE 1 TABLET BY MOUTH EVERY DAY Patient taking differently: Take 10 mg by mouth daily.  04/16/19  Yes Saguier, Percell Miller, PA-C  dexamethasone (DECADRON) 4 MG tablet Take 2 tablets by mouth once a day for 3 days after chemo. Take with food. Patient taking differently: Take 4 mg by mouth See admin instructions. Take 2 tablets by mouth once a day for 3 days after chemo. Take with food. 03/25/19  Yes Tish Men, MD  guaiFENesin-dextromethorphan (ROBITUSSIN DM) 100-10 MG/5ML syrup Take 5 mLs by mouth every 4 (four) hours as needed for cough.  04/19/19  Yes Tish Men, MD  lidocaine-prilocaine (EMLA) cream Apply to affected area once Patient taking differently: Apply 1 application topically daily as needed. Port. Apply to affected area once 03/25/19  Yes Tish Men, MD  LORazepam (ATIVAN) 0.5 MG tablet TAKE 1 TABLET (0.5 MG TOTAL) BY MOUTH EVERY 6 (SIX) HOURS  AS NEEDED (NAUSEA OR VOMITING). 04/22/19  Yes Tish Men, MD  losartan (COZAAR) 100 MG tablet TAKE 1 TABLET BY MOUTH EVERY DAY Patient taking differently: Take 100 mg by mouth daily.  02/12/19  Yes Saguier, Percell Miller, PA-C  metFORMIN (GLUCOPHAGE) 500 MG tablet TAKE 1 TABLET BY MOUTH TWICE A DAY WITH MEALS Patient taking differently: Take 500 mg by mouth 2 (two) times daily with a meal.  02/12/19  Yes Saguier, Percell Miller, PA-C  ondansetron (ZOFRAN) 8 MG tablet Take 1 tablet (8 mg total) by mouth 2 (two) times daily as needed. Start on the third day after chemotherapy. Patient taking differently: Take 8 mg by mouth 2 (two) times daily as needed for nausea or vomiting. Start on the third day after chemotherapy. 03/25/19  Yes Tish Men, MD  prochlorperazine (COMPAZINE) 10 MG tablet Take 1 tablet (10 mg total) by mouth every 6 (six) hours as needed (Nausea or vomiting). 03/25/19  Yes Tish Men, MD  HYDROcodone-acetaminophen (NORCO) 5-325 MG tablet Take 1-2 tablets by mouth every 6 (six) hours as needed for moderate pain or severe pain. Patient not taking: Reported on 05/17/2019 03/20/19   Fanny Skates, MD  sildenafil (VIAGRA) 100 MG tablet Take 0.5-1 tablets (50-100 mg total) by mouth daily as needed for erectile dysfunction. 06/13/17   Saguier, Percell Miller, PA-C     Allergies:    No Known Allergies   Physical Exam:   Vitals  Blood pressure 103/63, pulse 94, temperature 98.7 F (37.1 C), temperature source Oral, resp. rate 20, height 5\' 6"  (1.676 m), weight 77.6 kg, SpO2 95 %.  1.  General: axoxo3  2. Psychiatric: euthymic  3. Neurologic: nonfocal  4. HEENMT:  Anicteric,  Pupils 1.57mm  symmetric, direct, consensual intact Neck: no jvd  5. Respiratory : + crackles left > right base, no wheezing  6. Cardiovascular : rrr s1, s2, no m/g/r  7. Gastrointestinal:  Abd: soft, nt, nd, +bs  8. Skin:  Ext: no c/c/e,  No rash  9.Musculoskeletal:  Good ROM    Data Review:    CBC Recent Labs  Lab 05/16/19 1857  WBC 0.8*  HGB 8.9*  HCT 26.8*  PLT 213  MCV 83.0  MCH 27.6  MCHC 33.2  RDW 13.8  LYMPHSABS 0.4*  MONOABS 0.3  EOSABS 0.0  BASOSABS 0.0   ------------------------------------------------------------------------------------------------------------------  Results for orders placed or performed during the hospital encounter of 05/16/19 (from the past 48 hour(s))  Lactic acid, plasma     Status: None   Collection Time: 05/16/19  6:57 PM  Result Value Ref Range   Lactic Acid, Venous 1.2 0.5 - 1.9 mmol/L    Comment: Performed at [facility], [address] [place], [place] [address]  Comprehensive metabolic panel     Status: Abnormal   Collection Time: 05/16/19  6:57 PM  Result Value Ref Range   Sodium 127 (L) 135 - 145 mmol/L   Potassium 3.6 3.5 - 5.1 mmol/L   Chloride 95 (L) 98 - 111 mmol/L   CO2 23 22 - 32 mmol/L   Glucose, Bld 173 (H) 70 - 99 mg/dL   BUN 10 8 - 23 mg/dL   Creatinine, Ser 0.92 0.61 - 1.24 mg/dL   Calcium 7.9 (L) 8.9 - 10.3 mg/dL   Total Protein 6.1 (L) 6.5 - 8.1 g/dL   Albumin 2.8 (L) 3.5 - 5.0 g/dL   AST 18 15 - 41 U/L   ALT 30 0 - 44 U/L   Alkaline  Phosphatase 47 38 - 126 U/L   Total Bilirubin 0.9 0.3 - 1.2 mg/dL   GFR calc non Af Amer >60 >60 mL/min   GFR calc Af Amer >60 >60 mL/min   Anion gap 9 5 - 15    Comment: Performed at [facility], [address] [place], [place] [address]  CBC WITH DIFFERENTIAL     Status: Abnormal   Collection Time: 05/16/19  6:57 PM  Result Value Ref Range   WBC 0.8 (LL) 4.0 - 10.5 K/uL    Comment: This critical result has verified and been called to [facility] RN  by Bing Plume on 02 04 2021 at 2014, and has been read back.    RBC 3.23 (L) 4.22 - 5.81 MIL/uL   Hemoglobin 8.9 (L) 13.0 - 17.0 g/dL   HCT 26.8 (L) 39.0 - 52.0 %   MCV 83.0 80.0 - 100.0 fL   MCH 27.6 26.0 - 34.0 pg   MCHC 33.2 30.0 - 36.0 g/dL   RDW 13.8 11.5 - 15.5 %   Platelets 213 150 - 400 K/uL   nRBC 0.0 0.0 - 0.2 %   Neutrophils Relative % 4 %   Neutro Abs 0.0 (L) 1.7 - 7.7 K/uL   Lymphocytes Relative 56 %   Lymphs Abs 0.4 (L) 0.7 - 4.0 K/uL   Monocytes Relative 35 %   Monocytes Absolute 0.3 0.1 - 1.0 K/uL   Eosinophils Relative 1 %   Eosinophils Absolute 0.0 0.0 - 0.5 K/uL   Basophils Relative 3 %   Basophils Absolute 0.0 0.0 - 0.1 K/uL   Immature Granulocytes 1 %   Abs Immature Granulocytes 0.01 0.00 - 0.07 K/uL   Abnormal Lymphocytes Present PRESENT    Ovalocytes PRESENT    Stomatocytes PRESENT    Giant PLTs PRESENT     Comment: Performed at [facility], [address] [place], [place] [address]  Urinalysis, Routine w reflex microscopic     Status: Abnormal   Collection Time: 05/16/19  6:57 PM  Result Value Ref Range   Color, Urine YELLOW YELLOW   APPearance CLEAR CLEAR   Specific Gravity, Urine <1.005 (L) 1.005 - 1.030   pH 6.5 5.0 - 8.0   Glucose, UA NEGATIVE NEGATIVE mg/dL   Hgb urine dipstick NEGATIVE NEGATIVE   Bilirubin Urine NEGATIVE NEGATIVE   Ketones, ur NEGATIVE NEGATIVE mg/dL   Protein, ur NEGATIVE NEGATIVE mg/dL   Nitrite NEGATIVE NEGATIVE   Leukocytes,Ua NEGATIVE NEGATIVE    Comment: Microscopic not done on urines with negative protein, blood, leukocytes, nitrite, or glucose < 500 mg/dL. Performed at [facility], [address] [place], [place] [address]   Procalcitonin     Status: None   Collection Time: 05/16/19  6:57 PM  Result Value Ref Range   Procalcitonin 0.65 ng/mL    Comment:        Interpretation: PCT > 0.5 ng/mL and <= 2 ng/mL: Systemic infection (sepsis) is possible, but other conditions are known to  elevate PCT as well. (NOTE)       Sepsis PCT Algorithm           Lower Respiratory Tract                                      Infection PCT Algorithm    ----------------------------     ----------------------------  PCT < 0.25 ng/mL                PCT < 0.10 ng/mL         Strongly encourage             Strongly discourage   discontinuation of antibiotics    initiation of antibiotics    ----------------------------     -----------------------------       PCT 0.25 - 0.50 ng/mL            PCT 0.10 - 0.25 ng/mL               OR       >80% decrease in PCT            Discourage initiation of                                            antibiotics      Encourage discontinuation           of antibiotics    ----------------------------     -----------------------------         PCT >= 0.50 ng/mL              PCT 0.26 - 0.50 ng/mL                AND       <80% decrease in PCT             Encourage initiation of                                             antibiotics       Encourage continuation           of antibiotics    ----------------------------     -----------------------------        PCT >= 0.50 ng/mL                  PCT > 0.50 ng/mL               AND         increase in PCT                  Strongly encourage                                      initiation of antibiotics    Strongly encourage escalation           of antibiotics                                     -----------------------------                                           PCT <= 0.25 ng/mL  OR                                        > 80% decrease in PCT                                     Discontinue / Do not initiate                                             antibiotics Performed at [facility], [address] [name], [place] [address]   SARS Coronavirus 2 by RT PCR (hospital order, performed in [facility] hospital lab) Nasopharyngeal Nasopharyngeal  Swab     Status: Abnormal   Collection Time: 05/16/19  6:57 PM   Specimen: Nasopharyngeal Swab  Result Value Ref Range   SARS Coronavirus 2 POSITIVE (A) NEGATIVE    Comment: RESULT CALLED TO, READ BACK BY AND VERIFIED WITH: Patsy Baltimore RN @2039  05/16/2019 OLSONM Performed at [facility], [address] [place], [place] [address]     Chemistries  Recent Labs  Lab 05/16/19 1857  NA 127*  K 3.6  CL 95*  CO2 23  GLUCOSE 173*  BUN 10  CREATININE 0.92  CALCIUM 7.9*  AST 18  ALT 30  ALKPHOS 47  BILITOT 0.9   ------------------------------------------------------------------------------------------------------------------  ------------------------------------------------------------------------------------------------------------------ GFR: Estimated Creatinine Clearance: 80.6 mL/min (by C-G formula based on SCr of 0.92 mg/dL). Liver Function Tests: Recent Labs  Lab 05/16/19 1857  AST 18  ALT 30  ALKPHOS 47  BILITOT 0.9  PROT 6.1*  ALBUMIN 2.8*   No results for input(s): LIPASE, AMYLASE in the last 168 hours. No results for input(s): AMMONIA in the last 168 hours. Coagulation Profile: No results for input(s): INR, PROTIME in the last 168 hours. Cardiac Enzymes: No results for input(s): CKTOTAL, CKMB, CKMBINDEX, TROPONINI in the last 168 hours. BNP (last 3 results) No results for input(s): PROBNP in the last 8760 hours. HbA1C: No results for input(s): HGBA1C in the last 72 hours. CBG: No results for input(s): GLUCAP in the last 168 hours. Lipid Profile: No results for input(s): CHOL, HDL, LDLCALC, TRIG, CHOLHDL, LDLDIRECT in the last 72 hours. Thyroid Function Tests: No results for input(s): TSH, T4TOTAL, FREET4, T3FREE, THYROIDAB in the last 72 hours. Anemia Panel: No results for input(s): VITAMINB12, FOLATE, FERRITIN, TIBC, IRON, RETICCTPCT in the last 72  hours.  --------------------------------------------------------------------------------------------------------------- Urine analysis:    Component Value Date/Time   COLORURINE YELLOW 05/16/2019 1857   APPEARANCEUR CLEAR 05/16/2019 1857   LABSPEC <1.005 (L) 05/16/2019 1857   PHURINE 6.5 05/16/2019 1857   GLUCOSEU NEGATIVE 05/16/2019 1857   HGBUR NEGATIVE 05/16/2019 1857   HGBUR negative 03/23/2007 0000   BILIRUBINUR NEGATIVE 05/16/2019 1857   BILIRUBINUR neg 11/11/2016 1017   KETONESUR NEGATIVE 05/16/2019 1857   PROTEINUR NEGATIVE 05/16/2019 1857   UROBILINOGEN 0.2 11/11/2016 1017   UROBILINOGEN 0.2 11/08/2012 0915   NITRITE NEGATIVE 05/16/2019 1857   LEUKOCYTESUR NEGATIVE 05/16/2019 1857      Imaging Results:    DG Chest Portable 1 View  Result Date: 05/16/2019 CLINICAL DATA:  Shortness of breath and fever EXAM: PORTABLE CHEST 1 VIEW COMPARISON:  April 24, 2019 FINDINGS: The heart size and mediastinal contours are unchanged. Aortic knob calcifications. A left-sided MediPort catheter seen with the tip at the superior cavoatrial junction. Mildly increased interstitial markings are again noted at both lung bases. Overlying surgical clips are seen. No acute osseous abnormality. IMPRESSION: Subsegmental atelectasis or chronic interstitial thickening at both lung bases. No acute cardiopulmonary process. Electronically Signed   By: Prudencio Pair M.D.   On: 05/16/2019 18:52       Assessment & Plan:    Principal Problem:   Neutropenic fever ([place]) Active Problems:   Hodgkin lymphoma ([name])   Febrile neutropenia ([name])   COVID-19  Febrile neutropenia Blood culture x2 vanco iv, cefepime iv pharmacy to dose Check cbc in am  Fever, Covid-19 Check crp, bnp, d dimer, LDH, ferritin, trop I q2h x2 Dexamethasone 6mg  iv qday Remdesivir pharmacy to dose  Hodgkins lymphoma Per Dr. Everett Graff note oncology will be by in am, appreciate input  Anemia Check cbc in am  Tachycardia   Please f/u on troponin and d dimer If d dimer is positive then please order CTA chest r/o Pulmonary embolism Check tsh Check cardiac echo  Chronic diarrhea HOLD Metformin, consider alternative agent on discharge  Dm2 HOLD Metformin  fsbs ac and qhs, ISS  Hypertension Cont Losartan 100mg  po qday  Hyperlipidemia Cont Lipitor 10mg  po qday    DVT Prophylaxis-   Lovenox - SCDs   AM Labs Ordered, also please review Full Orders  Family Communication: Admission, patients condition and plan of care including tests being ordered have been discussed with the patient  who indicate understanding and agree with the plan and Code Status.  Code Status:  FULL CODE per patient, notified wife that patient is admitted to [facility] and also   Admission status: Inpatient: Based on patients clinical presentation and evaluation of above clinical data, I have made determination that patient meets Inpatient criteria at this time. Pt has febrile neutropenia and covid-19,  Pt will require iv abx, as well as iv decadron and iv remdesivir, pt will require > 2 nites stay.   Time spent in minutes : 70 minutes   Jani Gravel M.D on 05/17/2019 at 5:30 AM

## 2019-05-17 NOTE — Progress Notes (Signed)
Initial Nutrition Assessment  INTERVENTION:   -Ensure Enlive po BID, each supplement provides 350 kcal and 20 grams of protein -Prostat liquid protein PO 30 ml BID with meals, each supplement provides 100 kcal, 15 grams protein. -Magic cup BID with meals, each supplement provides 290 kcal and 9 grams of protein  NUTRITION DIAGNOSIS:   Increased nutrient needs related to cancer and cancer related treatments, acute illness(COVID-19 infection) as evidenced by estimated needs.  GOAL:   Patient will meet greater than or equal to 90% of their needs  MONITOR:   PO intake, Supplement acceptance, Labs, Weight trends, I & O's  REASON FOR ASSESSMENT:   Malnutrition Screening Tool    ASSESSMENT:   64 y.o. male,  w Hodgkins lymphoma, hypertension, DM2, OSA, apparently presents with fever to 103,  Pt was told by oncology to go to ED. Pt notes slight dry cough, has had since started chemo, and also diarrhea, which he has had since starting chemo.  Neither are worse than his baseline.  Pt notes alteration in sense of taste and smell.  His last chemo was 9 days ago. Admitted for febrile neutropenia and covid-19.  **RD working remotely**  Per chart review, pt has been undergoing chemo for Hodgkins lymphoma. Pt was last seen by [place] in December 2020. Pt was educated on high protein, high calorie diet at that time.   Pt has had a cough since starting chemo. Pt is COVID+.   Per PO documentation, pt has consumed 80% of breakfast (providing ~300 kcals, 20g protein).  Ensure and Prostat supplements have already been ordered for pt, currently accepting. Will add Magic cups to lunch/dinner meals as well.  Per weight records, pt has lost 18 lbs since November 2020 (9% wt loss x 3 months, significant for time frame).  Medications reviewed. Labs reviewed: CBGs: 181  NUTRITION - FOCUSED PHYSICAL EXAM:  Working remotely.  Diet Order:   Diet Order            Diet heart healthy/carb  modified Room service appropriate? Yes; Fluid consistency: Thin  Diet effective now              EDUCATION NEEDS:   No education needs have been identified at this time  Skin:  Skin Assessment: Reviewed RN Assessment  Last BM:  2/4  Height:   Ht Readings from Last 1 Encounters:  05/16/19 5\' 6"  (1.676 m)    Weight:   Wt Readings from Last 1 Encounters:  05/16/19 77.6 kg   BMI:  Body mass index is 27.6 kg/m.  Estimated Nutritional Needs:   Kcal:  4818-5631  Protein:  110-125g  Fluid:  2.3L/day  Mendel Ryder, MS, RD, LDN Inpatient Clinical Dietitian Contact information available via Amion

## 2019-05-17 NOTE — Progress Notes (Signed)
Lab call received to report pt has WBC count of 0.5. Dr Horris Latino notified.

## 2019-05-18 ENCOUNTER — Other Ambulatory Visit: Payer: Self-pay | Admitting: Hematology

## 2019-05-18 DIAGNOSIS — E871 Hypo-osmolality and hyponatremia: Secondary | ICD-10-CM

## 2019-05-18 LAB — COMPREHENSIVE METABOLIC PANEL
ALT: 25 U/L (ref 0–44)
AST: 17 U/L (ref 15–41)
Albumin: 2.5 g/dL — ABNORMAL LOW (ref 3.5–5.0)
Alkaline Phosphatase: 45 U/L (ref 38–126)
Anion gap: 14 (ref 5–15)
BUN: 15 mg/dL (ref 8–23)
CO2: 20 mmol/L — ABNORMAL LOW (ref 22–32)
Calcium: 8 mg/dL — ABNORMAL LOW (ref 8.9–10.3)
Chloride: 102 mmol/L (ref 98–111)
Creatinine, Ser: 0.82 mg/dL (ref 0.61–1.24)
GFR calc Af Amer: >60 mL/min (ref >60)
GFR calc non Af Amer: >60 mL/min (ref >60)
Glucose, Bld: 182 mg/dL — ABNORMAL HIGH (ref 70–99)
Potassium: 3.5 mmol/L (ref 3.5–5.1)
Sodium: 136 mmol/L (ref 135–145)
Total Bilirubin: 0.4 mg/dL (ref 0.3–1.2)
Total Protein: 5.7 g/dL — ABNORMAL LOW (ref 6.5–8.1)

## 2019-05-18 LAB — CBC WITH DIFFERENTIAL/PLATELET
Abs Immature Granulocytes: 0.01 10*3/uL (ref 0.00–0.07)
Basophils Absolute: 0 10*3/uL (ref 0.0–0.1)
Basophils Relative: 1 %
Eosinophils Absolute: 0 10*3/uL (ref 0.0–0.5)
Eosinophils Relative: 0 %
HCT: 26 % — ABNORMAL LOW (ref 39.0–52.0)
Hemoglobin: 8.3 g/dL — ABNORMAL LOW (ref 13.0–17.0)
Immature Granulocytes: 1 %
Lymphocytes Relative: 47 %
Lymphs Abs: 0.7 10*3/uL (ref 0.7–4.0)
MCH: 27.2 pg (ref 26.0–34.0)
MCHC: 31.9 g/dL (ref 30.0–36.0)
MCV: 85.2 fL (ref 80.0–100.0)
Monocytes Absolute: 0.6 10*3/uL (ref 0.1–1.0)
Monocytes Relative: 43 %
Neutro Abs: 0.1 10*3/uL — ABNORMAL LOW (ref 1.7–7.7)
Neutrophils Relative %: 8 %
Platelets: 277 10*3/uL (ref 150–400)
RBC: 3.05 MIL/uL — ABNORMAL LOW (ref 4.22–5.81)
RDW: 14.1 % (ref 11.5–15.5)
WBC: 1.4 10*3/uL — CL (ref 4.0–10.5)
nRBC: 0 % (ref 0.0–0.2)

## 2019-05-18 LAB — FERRITIN: Ferritin: 1678 ng/mL — ABNORMAL HIGH (ref 24–336)

## 2019-05-18 LAB — C-REACTIVE PROTEIN: CRP: 14.9 mg/dL — ABNORMAL HIGH (ref <1.0)

## 2019-05-18 LAB — CULTURE, BLOOD (ROUTINE X 2): Special Requests: ADEQUATE

## 2019-05-18 LAB — GLUCOSE, CAPILLARY
Glucose-Capillary: 194 mg/dL — ABNORMAL HIGH (ref 70–99)
Glucose-Capillary: 294 mg/dL — ABNORMAL HIGH (ref 70–99)
Glucose-Capillary: 293 mg/dL — ABNORMAL HIGH (ref 70–99)
Glucose-Capillary: 312 mg/dL — ABNORMAL HIGH (ref 70–99)
Glucose-Capillary: 274 mg/dL — ABNORMAL HIGH (ref 70–99)

## 2019-05-18 LAB — D-DIMER, QUANTITATIVE: D-Dimer, Quant: 0.92 ug/mL-FEU — ABNORMAL HIGH (ref 0.00–0.50)

## 2019-05-18 MED ORDER — FERROUS SULFATE 325 (65 FE) MG PO TABS
325.0000 mg | ORAL_TABLET | Freq: Every day | ORAL | Status: DC
  Administered 2019-05-19 – 2019-05-22 (×4): 325 mg via ORAL
  Filled 2019-05-18 (×4): qty 1

## 2019-05-18 MED ORDER — SODIUM CHLORIDE 0.9 % IV SOLN
510.0000 mg | Freq: Once | INTRAVENOUS | Status: AC
  Administered 2019-05-18: 510 mg via INTRAVENOUS
  Filled 2019-05-18: qty 17

## 2019-05-18 MED ORDER — INSULIN ASPART 100 UNIT/ML ~~LOC~~ SOLN
0.0000 [IU] | Freq: Every day | SUBCUTANEOUS | Status: DC
  Administered 2019-05-18 – 2019-05-19 (×2): 3 [IU] via SUBCUTANEOUS
  Administered 2019-05-20 – 2019-05-21 (×2): 2 [IU] via SUBCUTANEOUS

## 2019-05-18 MED ORDER — INSULIN ASPART 100 UNIT/ML ~~LOC~~ SOLN
0.0000 [IU] | Freq: Three times a day (TID) | SUBCUTANEOUS | Status: DC
  Administered 2019-05-18: 15 [IU] via SUBCUTANEOUS
  Administered 2019-05-19: 08:00:00 7 [IU] via SUBCUTANEOUS
  Administered 2019-05-19 (×2): 4 [IU] via SUBCUTANEOUS
  Administered 2019-05-20: 7 [IU] via SUBCUTANEOUS
  Administered 2019-05-20: 4 [IU] via SUBCUTANEOUS
  Administered 2019-05-21 (×2): 11 [IU] via SUBCUTANEOUS
  Administered 2019-05-21: 4 [IU] via SUBCUTANEOUS

## 2019-05-18 MED ORDER — CHLORHEXIDINE GLUCONATE CLOTH 2 % EX PADS
6.0000 | MEDICATED_PAD | Freq: Every day | CUTANEOUS | Status: DC
  Administered 2019-05-18 – 2019-05-22 (×5): 6 via TOPICAL

## 2019-05-18 MED ORDER — INSULIN DETEMIR 100 UNIT/ML ~~LOC~~ SOLN
10.0000 [IU] | Freq: Every day | SUBCUTANEOUS | Status: DC
  Administered 2019-05-18: 10 [IU] via SUBCUTANEOUS
  Filled 2019-05-18: qty 0.1

## 2019-05-18 NOTE — Progress Notes (Signed)
PROGRESS NOTE  Steve [name] IZT:[id number] DOB: 11/27/1955 DOA: 05/16/2019 PCP: Steve Pai, PA-C  HPI/Recap of past 24 hours: HPI from Dr Steve [name]  is a 64 y.o. male, w Hodgkins lymphoma, HTN, DM2, OSA, apparently presents with fever (103F). Pt was told by oncology to go to ED. Pt notes slight dry cough since started chemo, as well as diarrhea. Pt notes alteration in sense of taste and smell. His last chemo was 9 days ago, PTA. In the ED, T 102.7 F, P 118, R 26, BP 98/62  Pox 92% on Ra. Labs showed sodium 127, lactic acid 1.3, procalcitonin 0.65, WBC 0.8, hemoglobin 8.9, urinalysis negative, COVID-19 PCR positive, chest x-ray showed subsegmental atelectasis or chronic interstitial thickening at both lung bases.  EDP spoke to oncologist, who recommended inpatient admission and follow-up.  Patient admitted for further management.    Today, met patient sleeping, easily arousable.  Denies any new complaints.     Assessment/Plan: Principal Problem:   Neutropenic fever ([name]) Active Problems:   Hodgkin lymphoma ([name])   Febrile neutropenia ([name])   COVID-19   Anemia   Hyponatremia   Protein-calorie malnutrition, severe ([name])   COVID-19 virus infection Currently saturating around 94% on room air Currently afebrile, with neutropenia Inflammatory markers elevated, will trend Procalcitonin 0.65--> 0.80 LA 1.2 Chest x-ray unremarkable for any acute process CTA chest showed no signs of PE, noted multiple peripheral and nodular opacities with groundglass and septal thickening Continue remdesivir, Decadron Supplemental oxygen as needed, cough suppressants as needed Monitor closely  Neutropenic fever Fever may be likely 2/2 above, rule out sepsis  Procalcitonin elevated Urinalysis negative, UC no growth BC x2: 1/4 growing gram-positive cocci, staph coagulase-negative Chest x-ray unremarkable Continue cefepime, vancomycin Spoke to Dr. Maylon [name] of oncology on 05/18/2019,  stated patient received Neulasta and should expect a rise in WBC within the next few days.  He is available for any questions or concerns Monitor closely  Hyponatremia Resolved Likely 2/2 dehydration D/C gentle IV hydration Daily BMP  Hypertension Due to soft BP, will hold home losartan for now Monitor closely  Acute on chronic anemia of chronic disease/chemotherapy-induced Baseline hemoglobin around 11-12 No evidence of bleeding Likely chemo induced Anemia panel showed iron 19, sats 12%, ferritin 1678 We will give 1 dose of Feraheme on 05/18/19, continue with daily p.o. iron supplementation Daily CBC  Chemotherapy associated diarrhea Imodium as needed Monitor closely  Stage III Hodgkin's lymphoma Follows oncology Dr.Zhao, diagnosed around 02/2019 Currently on chemotherapy Dr. Maylon [name] available over the phone with questions        Malnutrition Type:  Nutrition Problem: Increased nutrient needs Etiology: cancer and cancer related treatments, acute illness(COVID-19 infection)   Malnutrition Characteristics:  Signs/Symptoms: estimated needs   Nutrition Interventions:  Interventions: Ensure Enlive (each supplement provides 350kcal and 20 grams of protein), MVI, Magic cup    Estimated body mass index is 27.6 kg/m as calculated from the following:   Height as of this encounter: _0  (1.676 m).   Weight as of this encounter: 77.6 kg.     Code Status: Full  Family Communication: None at bedside  Disposition Plan: Patient came from home, plan for DC likely back to home in 24 to 48 hours pending clinical improvement, and final results   Consultants:  Spoke to Dr. Maylon [name] on 05/18/2019  Procedures:  None  Antimicrobials:  Cefepime  Vancomycin  DVT prophylaxis: Lovenox   Objective: Vitals:   05/17/19 2127 05/18/19 0014 05/18/19 0526 05/18/19 1249  BP: 99/62 99/67 108/68 112/62  Pulse: 79 87 73 82  Resp:  20 20 (!) 23  Temp:  98.3 F (36.8 C) 98.1 F  (36.7 C) 98 F (36.7 C)  TempSrc:  Oral Oral Oral  SpO2:  94% 92% 91%  Weight:      Height:        Intake/Output Summary (Last 24 hours) at 05/18/2019 1402 Last data filed at 05/18/2019 1212 Gross per 24 hour  Intake 2729.35 ml  Output 2325 ml  Net 404.35 ml   Filed Weights   05/16/19 1819  Weight: 77.6 kg    Exam:  General: NAD, chronically ill-appearing  Cardiovascular: S1, S2 present  Respiratory:  Diminished breath sounds bilaterally  Abdomen: Soft, nontender, nondistended, bowel sounds present  Musculoskeletal: No bilateral pedal edema noted  Skin: Normal  Psychiatry: Normal mood   Data Reviewed: CBC: Recent Labs  Lab 05/16/19 1857 05/17/19 1701 05/18/19 0429  WBC 0.8* 0.5* 1.4*  NEUTROABS 0.0* 0.1* 0.1*  HGB 8.9* 9.0* 8.3*  HCT 26.8* 27.6* 26.0*  MCV 83.0 83.9 85.2  PLT 213 234 974   Basic Metabolic Panel: Recent Labs  Lab 05/16/19 1857 05/17/19 1701 05/18/19 0429  NA 127* 133* 136  K 3.6 3.9 3.5  CL 95* 102 102  CO2 23 22 20*  GLUCOSE 173* 313* 182*  BUN _0 CREATININE 0.92 0.81 0.82  CALCIUM 7.9* 8.3* 8.0*   GFR: Estimated Creatinine Clearance: 90.4 mL/min (by C-G formula based on SCr of 0.82 mg/dL). Liver Function Tests: Recent Labs  Lab 05/16/19 1857 05/17/19 1701 05/18/19 0429  AST _1 ALT _2 ALKPHOS 47 51 45  BILITOT 0.9 0.6 0.4  PROT 6.1* 6.4* 5.7*  ALBUMIN 2.8* 2.7* 2.5*   No results for input(s): LIPASE, AMYLASE in the last 168 hours. No results for input(s): AMMONIA in the last 168 hours. Coagulation Profile: No results for input(s): INR, PROTIME in the last 168 hours. Cardiac Enzymes: No results for input(s): CKTOTAL, CKMB, CKMBINDEX, TROPONINI in the last 168 hours. BNP (last 3 results) No results for input(s): PROBNP in the last 8760 hours. HbA1C: No results for input(s): HGBA1C in the last 72 hours. CBG: Recent Labs  Lab 05/17/19 1651 05/17/19 2124 05/18/19 0739 05/18/19 1145  05/18/19 1305  GLUCAP 273* 257* 194* 294* 293*   Lipid Profile: No results for input(s): CHOL, HDL, LDLCALC, TRIG, CHOLHDL, LDLDIRECT in the last 72 hours. Thyroid Function Tests: No results for input(s): TSH, T4TOTAL, FREET4, T3FREE, THYROIDAB in the last 72 hours. Anemia Panel: Recent Labs    05/17/19 0517 05/17/19 1701 05/18/19 0429  VITAMINB12  --  1,380*  --   FOLATE  --  11.4  --   FERRITIN 1,441*  --  1,678*  TIBC  --  164*  --   IRON  --  19*  --    Urine analysis:    Component Value Date/Time   COLORURINE YELLOW 05/16/2019 1857   APPEARANCEUR CLEAR 05/16/2019 1857   LABSPEC <1.005 (L) 05/16/2019 1857   PHURINE 6.5 05/16/2019 1857   GLUCOSEU NEGATIVE 05/16/2019 1857   HGBUR NEGATIVE 05/16/2019 1857   HGBUR negative 03/23/2007 0000   BILIRUBINUR NEGATIVE 05/16/2019 1857   BILIRUBINUR neg 11/11/2016 [place] 05/16/2019 1857   PROTEINUR NEGATIVE 05/16/2019 1857   UROBILINOGEN 0.2 11/11/2016 1017   UROBILINOGEN 0.2 11/08/2012 0915   NITRITE NEGATIVE 05/16/2019 1857   LEUKOCYTESUR NEGATIVE 05/16/2019 1857  Sepsis Labs: _0 (procalcitonin:4,lacticidven:4)  ) Recent Results (from the past 240 hour(s))  Urine culture     Status: None   Collection Time: 05/16/19  6:57 PM   Specimen: In/Out Cath Urine  Result Value Ref Range Status   Specimen Description   Final    IN/OUT CATH URINE Performed at [facility], [address] [place], [place] [address]    Special Requests   Final    NONE Performed at [facility], [address] [place], [place] [address]    Culture   Final    NO GROWTH Performed at [facility], [place] [address] [place], [place] [address]    Report Status 05/17/2019 FINAL  Final  SARS Coronavirus 2 by RT PCR (hospital order, performed in [facility] hospital lab) Nasopharyngeal Nasopharyngeal Swab     Status: Abnormal   Collection Time: 05/16/19  6:57 PM   Specimen: Nasopharyngeal Swab   Result Value Ref Range Status   SARS Coronavirus 2 POSITIVE (A) NEGATIVE Final    Comment: RESULT CALLED TO, READ BACK BY AND VERIFIED WITH: Patsy [place] RN _1  05/16/2019 OLSONM Performed at [facility], [address] [place], [place] [address]   Blood Culture (routine x 2)     Status: Abnormal   Collection Time: 05/16/19  6:58 PM   Specimen: BLOOD  Result Value Ref Range Status   Specimen Description   Final    BLOOD LEFT ANTECUBITAL Performed at [facility], [address] [place], [place] [address]    Special Requests   Final    BOTTLES DRAWN AEROBIC AND ANAEROBIC Blood Culture adequate volume Performed at [facility], [address] [place], [place] [address]    Culture  Setup Time   Final    AEROBIC BOTTLE ONLY GRAM POSITIVE COCCI CRITICAL RESULT CALLED TO, READ BACK BY AND VERIFIED WITH: M BELL PHARMD 05/17/19 1931 JDW    Culture (A)  Final    STAPHYLOCOCCUS SPECIES (COAGULASE NEGATIVE) THE SIGNIFICANCE OF ISOLATING THIS ORGANISM FROM A SINGLE SET OF BLOOD CULTURES WHEN MULTIPLE SETS ARE DRAWN IS UNCERTAIN. PLEASE NOTIFY THE MICROBIOLOGY DEPARTMENT WITHIN ONE WEEK IF SPECIATION AND SENSITIVITIES ARE REQUIRED. Performed at [facility], [place] [address] [place], [place] [address]    Report Status 05/18/2019 FINAL  Final  Blood Culture (routine x 2)     Status: None (Preliminary result)   Collection Time: 05/16/19  6:58 PM   Specimen: BLOOD  Result Value Ref Range Status   Specimen Description   Final    BLOOD RIGHT ANTECUBITAL Performed at [facility], [address] [place], [place] [address]    Special Requests   Final    BOTTLES DRAWN AEROBIC AND ANAEROBIC Blood Culture results may not be optimal due to an excessive volume of blood received in culture bottles Performed at [facility], [address] [place], [place] [address]    Culture   Final    NO GROWTH 2 DAYS Performed at [facility], [place] [address], [place], [name] [address]    Report Status PENDING  Incomplete      Studies: No results found.  Scheduled Meds: . ascorbic acid  500 mg Oral Daily  . atorvastatin  10 mg Oral Daily  . Chlorhexidine Gluconate Cloth  6 each Topical Daily  . dexamethasone (DECADRON) injection  6 mg  Intravenous Daily  . enoxaparin (LOVENOX) injection  40 mg Subcutaneous Daily  . feeding supplement (ENSURE ENLIVE)  237 mL Oral BID BM  . feeding supplement (PRO-STAT SUGAR FREE 64)  30 mL Oral BID  . insulin aspart  0-5 Units Subcutaneous QHS  . insulin aspart  0-9 Units Subcutaneous TID WC  . losartan  100 mg Oral Daily  . zinc sulfate  220 mg Oral Daily    Continuous Infusions: . sodium chloride Stopped (05/16/19 2251)  . sodium chloride 50 mL/hr at 05/18/19 1212  . ceFEPime (MAXIPIME) IV 2 g (05/18/19 1345)  . remdesivir 100 mg in NS 100 mL 100 mg (05/18/19 1002)  . vancomycin 1,250 mg (05/17/19 1851)     LOS: 2 days     Alma Friendly, MD [facility]  If 7PM-7AM, please contact night-coverage [email or web address] 05/18/2019, 2:02 PM

## 2019-05-18 NOTE — Progress Notes (Signed)
CRITICAL VALUE ALERT  Critical Value: WBC 1.4  Date & Time Notied:  05/18/19 @0520   Provider Notified: [place]  Orders Received/Actions taken: No new orders at the moment

## 2019-05-18 NOTE — Progress Notes (Signed)
Report called to [place] at [facility].

## 2019-05-19 DIAGNOSIS — J1282 Pneumonia due to coronavirus disease 2019: Secondary | ICD-10-CM

## 2019-05-19 DIAGNOSIS — E1165 Type 2 diabetes mellitus with hyperglycemia: Secondary | ICD-10-CM

## 2019-05-19 LAB — CBC WITH DIFFERENTIAL/PLATELET
Abs Immature Granulocytes: 0.34 10*3/uL — ABNORMAL HIGH (ref 0.00–0.07)
Basophils Absolute: 0.1 10*3/uL (ref 0.0–0.1)
Basophils Relative: 2 %
Eosinophils Absolute: 0 10*3/uL (ref 0.0–0.5)
Eosinophils Relative: 0 %
HCT: 26.7 % — ABNORMAL LOW (ref 39.0–52.0)
Hemoglobin: 8.7 g/dL — ABNORMAL LOW (ref 13.0–17.0)
Immature Granulocytes: 10 %
Lymphocytes Relative: 39 %
Lymphs Abs: 1.3 10*3/uL (ref 0.7–4.0)
MCH: 27.2 pg (ref 26.0–34.0)
MCHC: 32.6 g/dL (ref 30.0–36.0)
MCV: 83.4 fL (ref 80.0–100.0)
Monocytes Absolute: 1.1 10*3/uL — ABNORMAL HIGH (ref 0.1–1.0)
Monocytes Relative: 34 %
Neutro Abs: 0.5 10*3/uL — ABNORMAL LOW (ref 1.7–7.7)
Neutrophils Relative %: 15 %
Platelets: 342 10*3/uL (ref 150–400)
RBC: 3.2 MIL/uL — ABNORMAL LOW (ref 4.22–5.81)
RDW: 14.4 % (ref 11.5–15.5)
WBC: 3.3 10*3/uL — ABNORMAL LOW (ref 4.0–10.5)
nRBC: 1.5 % — ABNORMAL HIGH (ref 0.0–0.2)

## 2019-05-19 LAB — COMPREHENSIVE METABOLIC PANEL
ALT: 26 U/L (ref 0–44)
AST: 20 U/L (ref 15–41)
Albumin: 2.4 g/dL — ABNORMAL LOW (ref 3.5–5.0)
Alkaline Phosphatase: 44 U/L (ref 38–126)
Anion gap: 7 (ref 5–15)
BUN: 19 mg/dL (ref 8–23)
CO2: 25 mmol/L (ref 22–32)
Calcium: 8.6 mg/dL — ABNORMAL LOW (ref 8.9–10.3)
Chloride: 107 mmol/L (ref 98–111)
Creatinine, Ser: 0.81 mg/dL (ref 0.61–1.24)
GFR calc Af Amer: >60 mL/min (ref >60)
GFR calc non Af Amer: >60 mL/min (ref >60)
Glucose, Bld: 176 mg/dL — ABNORMAL HIGH (ref 70–99)
Potassium: 4 mmol/L (ref 3.5–5.1)
Sodium: 139 mmol/L (ref 135–145)
Total Bilirubin: 0.5 mg/dL (ref 0.3–1.2)
Total Protein: 5.6 g/dL — ABNORMAL LOW (ref 6.5–8.1)

## 2019-05-19 LAB — HEMOGLOBIN A1C
Hgb A1c MFr Bld: 8.3 % — ABNORMAL HIGH (ref 4.8–5.6)
Mean Plasma Glucose: 191.51 mg/dL

## 2019-05-19 LAB — GLUCOSE, CAPILLARY
Glucose-Capillary: 228 mg/dL — ABNORMAL HIGH (ref 70–99)
Glucose-Capillary: 199 mg/dL — ABNORMAL HIGH (ref 70–99)
Glucose-Capillary: 191 mg/dL — ABNORMAL HIGH (ref 70–99)
Glucose-Capillary: 294 mg/dL — ABNORMAL HIGH (ref 70–99)

## 2019-05-19 LAB — FERRITIN: Ferritin: 2244 ng/mL — ABNORMAL HIGH (ref 24–336)

## 2019-05-19 LAB — C-REACTIVE PROTEIN: CRP: 5.3 mg/dL — ABNORMAL HIGH (ref <1.0)

## 2019-05-19 LAB — MRSA PCR SCREENING: MRSA by PCR: NEGATIVE

## 2019-05-19 LAB — D-DIMER, QUANTITATIVE: D-Dimer, Quant: 0.73 ug/mL-FEU — ABNORMAL HIGH (ref 0.00–0.50)

## 2019-05-19 MED ORDER — INSULIN DETEMIR 100 UNIT/ML ~~LOC~~ SOLN
10.0000 [IU] | Freq: Two times a day (BID) | SUBCUTANEOUS | Status: DC
  Administered 2019-05-19 – 2019-05-22 (×7): 10 [IU] via SUBCUTANEOUS
  Filled 2019-05-19 (×8): qty 0.1

## 2019-05-19 MED ORDER — SALINE SPRAY 0.65 % NA SOLN
1.0000 | NASAL | Status: DC | PRN
  Filled 2019-05-19: qty 44

## 2019-05-19 MED ORDER — VANCOMYCIN HCL IN DEXTROSE 1-5 GM/200ML-% IV SOLN
1000.0000 mg | Freq: Two times a day (BID) | INTRAVENOUS | Status: DC
  Administered 2019-05-19 – 2019-05-20 (×2): 1000 mg via INTRAVENOUS
  Filled 2019-05-19 (×3): qty 200

## 2019-05-19 NOTE — Progress Notes (Signed)
PROGRESS NOTE  Steve [name] JME:[id number] DOB: 1955-10-19 DOA: 05/16/2019  PCP: [name], PA-C  Brief History/Interval Summary: 64 y.o.male, w Hodgkins lymphoma, HTN, DM2, OSA, presented with fever.  Also had alteration in sense of taste and smell.  Patient is getting chemotherapy for Hodgkin's lymphoma.  His last treatment was on January 25.  He also received Neulasta infusion.  Chest x-ray showed subsegmental atelectasis or chronic interstitial thickening.  COVID-19 was positive.  CT chest was positive for multifocal opacities.  Patient was hospitalized due to his immunocompromise status.    Reason for Visit: Pneumonia due to COVID-19.  Neutropenic fever  Consultants: Phone consultation with oncology  Procedures: None  Antibiotics: Anti-infectives (From admission, onward)   Start     Dose/Rate Route Frequency Ordered Stop   05/19/19 1200  vancomycin (VANCOCIN) IVPB 1000 mg/200 mL premix     1,000 mg 200 mL/hr over 60 Minutes Intravenous Every 12 hours 05/19/19 0945     05/18/19 1000  remdesivir 100 mg in sodium chloride 0.9 % 100 mL IVPB     100 mg 200 mL/hr over 30 Minutes Intravenous Daily 05/17/19 0443 05/22/19 0959   05/17/19 1800  vancomycin (VANCOREADY) IVPB 1250 mg/250 mL  Status:  Discontinued     1,250 mg 166.7 mL/hr over 90 Minutes Intravenous Every 24 hours 05/16/19 2005 05/19/19 0945   05/17/19 0600  ceFEPIme (MAXIPIME) 2 g in sodium chloride 0.9 % 100 mL IVPB     2 g 200 mL/hr over 30 Minutes Intravenous Every 8 hours 05/16/19 2005     05/17/19 0515  remdesivir 200 mg in sodium chloride 0.9% 250 mL IVPB     200 mg 580 mL/hr over 30 Minutes Intravenous Once 05/17/19 0443 05/17/19 0605   05/16/19 2041  vancomycin (VANCOCIN) 500 MG powder    Note to Pharmacy: Jaci Carrel   : cabinet override      05/16/19 2041 05/17/19 0844   05/16/19 2030  metroNIDAZOLE (FLAGYL) IVPB 500 mg     500 mg 100 mL/hr over 60 Minutes Intravenous  Once 05/16/19 2018  05/16/19 2246   05/16/19 2015  vancomycin (VANCOREADY) IVPB 500 mg/100 mL     500 mg 100 mL/hr over 60 Minutes Intravenous  Once 05/16/19 2005 05/16/19 2215   05/16/19 1845  ceFEPIme (MAXIPIME) 2 g in sodium chloride 0.9 % 100 mL IVPB     2 g 200 mL/hr over 30 Minutes Intravenous  Once 05/16/19 1842 05/16/19 1955   05/16/19 1845  vancomycin (VANCOCIN) IVPB 1000 mg/200 mL premix     1,000 mg 200 mL/hr over 60 Minutes Intravenous  Once 05/16/19 1842 05/16/19 2029      Subjective/Interval History: Patient states that he is feeling better.  Denies any cough this morning.  Shortness of breath improved.  No nausea or vomiting.    Assessment/Plan:  Pneumonia due to COVID-19  Recent Labs  Lab 05/16/19 1857 05/17/19 0517 05/17/19 1701 05/18/19 0429 05/19/19 0356  DDIMER  --  1.40*  --  0.92* 0.73*  FERRITIN  --  1,441*  --  1,678* 2,244*  CRP  --  17.1*  --  14.9* 5.3*  ALT 30  --  31 25 26   PROCALCITON 0.65 0.80  --   --   --     Patient was not hypoxic.  CT angiogram showed opacities in the lungs.  No PE was noted.  Patient started on remdesivir and steroids.  He does not have any oxygen requirements  currently.  Today is his third day of remdesivir.  He is also on antibacterials due to elevated procalcitonin as well as neutropenic fever.  Follow-up on cultures.  Antitussive agents.  Continue with vitamin C and zinc.  Mobilize.  Incentive spirometry.  CRP was elevated at 17.1.  Improved to 5.3 today.  Ferritin is elevated.  D-dimer is stable.  No role for Actemra due to immunocompromise status and malignancy.  Neutropenic fever/bacteremia Neutropenia secondary to chemotherapy.  Patient received Neulasta infusion 1/25 along with his chemotherapy.  Hopefully his counts will rebound soon.  Neutrophils have improved to 500 this morning.  Continue with vancomycin and Zosyn.  1 set of blood culture positive for coag negative staph which is likely a contaminant.  De-escalate antibiotics based  on clinical status, neutrophil count and culture data.  Hodgkin's lymphoma, stage III Followed by Dr. Maylon Peppers with oncology.  Diagnosed in November 2020.  Currently on chemotherapy.  Last chemotherapy was on 1/25 when he received doxorubicin, vinblastine, dacarbazine and brentuximab.  He has had 2 cycles so far.  He was supposed to get his third cycle this week however will have to hold off for now.  Diabetes mellitus type 2, uncontrolled with hyperglycemia HbA1c is 8.3.  Elevated CBGs due to steroids.  Patient on Metformin at home which is held.  Currently getting SSI and Levemir.  Elevated.  Will increase the dose of Levemir.  Essential hypertension Holding losartan due to soft blood pressures.  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  No evidence of overt bleeding.  Chemotherapy associated diarrhea Imodium as needed.  Has not had any diarrhea in the last 24 hours.  Hyponatremia Resolved with hydration  DVT Prophylaxis: Lovenox Code Status: Full code Family Communication: Discussed with the patient.  Will update his wife later today Disposition Plan: Hopefully home when ready for discharge.  Mobilize.   Medications:  Scheduled: . ascorbic acid  500 mg Oral Daily  . atorvastatin  10 mg Oral Daily  . Chlorhexidine Gluconate Cloth  6 each Topical Daily  . dexamethasone (DECADRON) injection  6 mg Intravenous Daily  . enoxaparin (LOVENOX) injection  40 mg Subcutaneous Daily  . feeding supplement (ENSURE ENLIVE)  237 mL Oral BID BM  . feeding supplement (PRO-STAT SUGAR FREE 64)  30 mL Oral BID  . ferrous sulfate  325 mg Oral Q breakfast  . insulin aspart  0-20 Units Subcutaneous TID WC  . insulin aspart  0-5 Units Subcutaneous QHS  . insulin detemir  10 Units Subcutaneous QHS  . zinc sulfate  220 mg Oral Daily   Continuous: . sodium chloride Stopped (05/16/19 2251)  . ceFEPime (MAXIPIME) IV 2 g (05/19/19 6606)  . remdesivir  100 mg in NS 100 mL 100 mg (05/19/19 0847)  . vancomycin     TKZ:SWFUXN chloride, acetaminophen, guaiFENesin-dextromethorphan, LORazepam, prochlorperazine   Objective:  Vital Signs  Vitals:   05/19/19 0024 05/19/19 0601 05/19/19 0723 05/19/19 0814  BP: (!) 102/58 (!) 104/57 100/61 104/61  Pulse: 74 61 70 67  Resp: 20 (!) 21 (!) 26 (!) 22  Temp: 98.6 F (37 C) 98.2 F (36.8 C) 98.2 F (36.8 C)   TempSrc: Oral Oral Oral   SpO2: 93% 91% 93% 93%  Weight:      Height:        Intake/Output Summary (Last 24 hours) at 05/19/2019 0959 Last data filed at 05/19/2019 0100 Gross per 24 hour  Intake 1563.2 ml  Output  600 ml  Net 963.2 ml   Filed Weights   05/16/19 1819  Weight: 77.6 kg    General appearance: Awake alert.  In no distress Resp: Normal effort at rest.  Few crackles at the bases bilaterally.  No wheezing or rhonchi. Cardio: S1-S2 is normal regular.  No S3-S4.  No rubs murmurs or bruit GI: Abdomen is soft.  Nontender nondistended.  Bowel sounds are present normal.  No masses organomegaly Extremities: No edema.  Full range of motion of lower extremities. Neurologic: Alert and oriented x3.  No focal neurological deficits.    Lab Results:  Data Reviewed: I have personally reviewed following labs and imaging studies  CBC: Recent Labs  Lab 05/16/19 1857 05/17/19 1701 05/18/19 0429 05/19/19 0356  WBC 0.8* 0.5* 1.4* 3.3*  NEUTROABS 0.0* 0.1* 0.1* 0.5*  HGB 8.9* 9.0* 8.3* 8.7*  HCT 26.8* 27.6* 26.0* 26.7*  MCV 83.0 83.9 85.2 83.4  PLT 213 234 277 161    Basic Metabolic Panel: Recent Labs  Lab 05/16/19 1857 05/17/19 1701 05/18/19 0429 05/19/19 0356  NA 127* 133* 136 139  K 3.6 3.9 3.5 4.0  CL 95* 102 102 107  CO2 23 22 20* 25  GLUCOSE 173* 313* 182* 176*  BUN 10 15 15 19   CREATININE 0.92 0.81 0.82 0.81  CALCIUM 7.9* 8.3* 8.0* 8.6*    GFR: Estimated Creatinine Clearance: 91.5 mL/min (by C-G formula based on SCr of 0.81 mg/dL).  Liver Function  Tests: Recent Labs  Lab 05/16/19 1857 05/17/19 1701 05/18/19 0429 05/19/19 0356  AST 18 21 17 20   ALT 30 31 25 26   ALKPHOS 47 51 45 44  BILITOT 0.9 0.6 0.4 0.5  PROT 6.1* 6.4* 5.7* 5.6*  ALBUMIN 2.8* 2.7* 2.5* 2.4*    HbA1C: Recent Labs    05/19/19 0356  HGBA1C 8.3*    CBG: Recent Labs  Lab 05/18/19 1145 05/18/19 1305 05/18/19 1608 05/18/19 2111 05/19/19 0813  GLUCAP 294* 293* 312* 274* 228*    Anemia Panel: Recent Labs    05/17/19 0517 05/17/19 1701 05/18/19 0429 05/19/19 0356  VITAMINB12  --  1,380*  --   --   FOLATE  --  11.4  --   --   FERRITIN   < >  --  1,678* 2,244*  TIBC  --  164*  --   --   IRON  --  19*  --   --    < > = values in this interval not displayed.    Recent Results (from the past 240 hour(s))  Urine culture     Status: None   Collection Time: 05/16/19  6:57 PM   Specimen: In/Out Cath Urine  Result Value Ref Range Status   Specimen Description   Final    IN/OUT CATH URINE Performed at [facility], [address] [place], [place] [address]    Special Requests   Final    NONE Performed at [facility], [address] [place], [place] [address]    Culture   Final    NO GROWTH Performed at [facility], [place] [address] [place], [place] [address]    Report Status 05/17/2019 FINAL  Final  SARS Coronavirus 2 by RT PCR (hospital order, performed in [facility] hospital lab) Nasopharyngeal Nasopharyngeal Swab     Status: Abnormal   Collection Time: 05/16/19  6:57 PM   Specimen: Nasopharyngeal Swab  Result Value Ref Range Status  SARS Coronavirus 2 POSITIVE (A) NEGATIVE Final    Comment: RESULT CALLED TO, READ BACK BY AND VERIFIED WITH: Patsy Baltimore RN @2039  05/16/2019 OLSONM Performed at [facility], [address] [place], [place] [address]   Blood Culture (routine x 2)     Status: Abnormal   Collection Time: 05/16/19  6:58 PM   Specimen: BLOOD  Result Value Ref Range Status    Specimen Description   Final    BLOOD LEFT ANTECUBITAL Performed at [facility], [address] [place], [place] [address]    Special Requests   Final    BOTTLES DRAWN AEROBIC AND ANAEROBIC Blood Culture adequate volume Performed at [facility], [address] [place], [place] [address]    Culture  Setup Time   Final    AEROBIC BOTTLE ONLY GRAM POSITIVE COCCI CRITICAL RESULT CALLED TO, READ BACK BY AND VERIFIED WITH: M BELL PHARMD 05/17/19 1931 JDW    Culture (A)  Final    STAPHYLOCOCCUS SPECIES (COAGULASE NEGATIVE) THE SIGNIFICANCE OF ISOLATING THIS ORGANISM FROM A SINGLE SET OF BLOOD CULTURES WHEN MULTIPLE SETS ARE DRAWN IS UNCERTAIN. PLEASE NOTIFY THE MICROBIOLOGY DEPARTMENT WITHIN ONE WEEK IF SPECIATION AND SENSITIVITIES ARE REQUIRED. Performed at [facility], [place] [address], [place], [place] [address]    Report Status 05/18/2019 FINAL  Final  Blood Culture (routine x 2)     Status: None (Preliminary result)   Collection Time: 05/16/19  6:58 PM   Specimen: BLOOD  Result Value Ref Range Status   Specimen Description   Final    BLOOD RIGHT ANTECUBITAL Performed at [facility], [address] [place], [place] [address]    Special Requests   Final    BOTTLES DRAWN AEROBIC AND ANAEROBIC Blood Culture results may not be optimal due to an excessive volume of blood received in culture bottles Performed at [facility], [address] [place], [place] [address]    Culture   Final    NO GROWTH 3 DAYS Performed at [facility], [place] [address] [place], [place] [address]    Report Status PENDING  Incomplete      Radiology Studies: CT ANGIO CHEST PE W OR WO CONTRAST  Result Date: 05/17/2019 CLINICAL DATA:  PE suspected. History of Hodgkin's lymphoma EXAM: CT ANGIOGRAPHY CHEST WITH CONTRAST TECHNIQUE: Multidetector CT imaging of the chest was performed using the standard protocol during bolus administration of intravenous  contrast. Multiplanar CT image reconstructions and MIPs were obtained to evaluate the vascular anatomy. CONTRAST:  125mL OMNIPAQUE IOHEXOL 350 MG/ML SOLN COMPARISON:  02/28/2019 FINDINGS: Cardiovascular: Heart size upper limits of normal unchanged. No pericardial effusion. Aortic caliber with mild dilation of the ascending thoracic aorta approximately 3.2 cm. Central pulmonary vasculature mildly dilated moderately dilated at 3 cm max caliber. No signs of pulmonary embolism. Mildly limited assessment due to bolus timing and respiratory motion. Left sided Port-A-Cath terminates at the caval to atrial junction. Mediastinum/Nodes: Bulky mediastinal lymphadenopathy is diminished in size. Right paratracheal lymph node 3 x 2.6 cm previously 5.8 x 4.7 cm. Other lymph nodes with similar decrease in size. No signs of axillary adenopathy. Lungs/Pleura: Multiple peripheral and nodular opacities with ground-glass and septal thickening have developed since the study of November of 2020 where there was some mild subpleural reticulation. No signs of dense consolidative change of the lobar distribution. Upper Abdomen: Imaged portions of liver, spleen, adrenal glands  and upper portions of the kidneys are unremarkable. Spleen is incompletely imaged as are other upper abdominal viscera. Musculoskeletal: No signs of chest wall lesion. No acute or destructive bone process. Review of the MIP images confirms the above findings. IMPRESSION: 1. No signs of pulmonary embolism. 2. Interval development of multiple peripheral and nodular opacities with ground-glass and septal thickening. Findings of atypical infection compatible with history of COVID-19. 3. Interval decrease in size of bulky mediastinal lymphadenopathy. 4. Mild dilation of the ascending thoracic aorta to 3.2 cm max caliber. Recommend annual imaging followup by CTA or MRA. This recommendation follows 2010 ACCF/AHA/AATS/ACR/ASA/SCA/SCAI/SIR/STS/SVM Guidelines for the Diagnosis and  Management of Patients with Thoracic Aortic Disease. Circulation.2010; 121: M546-T035. Aortic aneurysm NOS (ICD10-I71.9) 5. Mild central pulmonary arterial dilatation suggestive of pulmonary arterial hypertension. 6. Left-sided Port-A-Cath terminates at the caval to atrial junction. Electronically Signed   By: Zetta Bills M.D.   On: 05/17/2019 10:49       LOS: 3 days   [facility] Pager on [email or web address]  05/19/2019, 9:59 AM

## 2019-05-19 NOTE — Progress Notes (Signed)
Pharmacy Antibiotic Note  Steve [name] is a 64 y.o. male admitted on 05/16/2019 with persistent fever and suspected infection with unknown source.  Pharmacy has been consulted for vancomycin and cefepime dosing.  The patient's renal function has improved with SCr down to 0.81, CrCl~80-100 ml/min. Will adjust Vancomycin dose.   MRSA PCR ordered 2/5 but not taken - will re-order. Of noting, the IDSA/ASCO Febrile Neutropenia guidelines do not recommend empiric therapy with MRSA coverage unless other risk factors are present such as suspected catheter-related infection, skin or soft-tissue infection, pneumonia, hemodynamic instability, severe oral or pharyngeal mucositis or history of MRSA infection. No specific risk factors noted except for PNA - if MRSA PCR neg, consider d/cing Vancomycin.   Plan: - Adjust Vancomycin to 1g IV every 12 hours (est AUC 481, SCr 0.81, Vd 0.72) - Consider d/cing if MRSA PCR is negative and no other risk factors are present - Continue Cefepime 2g IV every 8 hours - Will continue to follow renal function, culture results, LOT, and antibiotic de-escalation plans   Height: 5\' 6"  (167.6 cm) Weight: 171 lb (77.6 kg) IBW/kg (Calculated) : 63.8  Temp (24hrs), Avg:98.3 F (36.8 C), Min:98 F (36.7 C), Max:98.6 F (37 C)  Recent Labs  Lab 05/16/19 1857 05/17/19 1701 05/18/19 0429 05/19/19 0356  WBC 0.8* 0.5* 1.4* 3.3*  CREATININE 0.92 0.81 0.82 0.81  LATICACIDVEN 1.2  --   --   --     Estimated Creatinine Clearance: 91.5 mL/min (by C-G formula based on SCr of 0.81 mg/dL).    No Known Allergies  Thank you for allowing pharmacy to be a part of this patient's care.  Alycia Rossetti, PharmD, BCPS Clinical Pharmacist 05/19/2019 9:57 AM   **Pharmacist phone directory can now be found on [email or web address] ([email or web address]).  Listed under [place].

## 2019-05-20 ENCOUNTER — Inpatient Hospital Stay: Payer: BC Managed Care – PPO

## 2019-05-20 ENCOUNTER — Inpatient Hospital Stay: Payer: BC Managed Care – PPO | Admitting: Hematology

## 2019-05-20 DIAGNOSIS — D649 Anemia, unspecified: Secondary | ICD-10-CM

## 2019-05-20 LAB — COMPREHENSIVE METABOLIC PANEL
ALT: 33 U/L (ref 0–44)
AST: 27 U/L (ref 15–41)
Albumin: 2.5 g/dL — ABNORMAL LOW (ref 3.5–5.0)
Alkaline Phosphatase: 45 U/L (ref 38–126)
Anion gap: 8 (ref 5–15)
BUN: 21 mg/dL (ref 8–23)
CO2: 26 mmol/L (ref 22–32)
Calcium: 8.6 mg/dL — ABNORMAL LOW (ref 8.9–10.3)
Chloride: 105 mmol/L (ref 98–111)
Creatinine, Ser: 0.67 mg/dL (ref 0.61–1.24)
GFR calc Af Amer: >60 mL/min (ref >60)
GFR calc non Af Amer: >60 mL/min (ref >60)
Glucose, Bld: 154 mg/dL — ABNORMAL HIGH (ref 70–99)
Potassium: 3.6 mmol/L (ref 3.5–5.1)
Sodium: 139 mmol/L (ref 135–145)
Total Bilirubin: 0.3 mg/dL (ref 0.3–1.2)
Total Protein: 5.4 g/dL — ABNORMAL LOW (ref 6.5–8.1)

## 2019-05-20 LAB — CBC WITH DIFFERENTIAL/PLATELET
Abs Immature Granulocytes: 1.8 10*3/uL — ABNORMAL HIGH (ref 0.00–0.07)
Band Neutrophils: 9 %
Basophils Absolute: 0 10*3/uL (ref 0.0–0.1)
Basophils Relative: 0 %
Eosinophils Absolute: 0 10*3/uL (ref 0.0–0.5)
Eosinophils Relative: 0 %
HCT: 26.3 % — ABNORMAL LOW (ref 39.0–52.0)
Hemoglobin: 8.6 g/dL — ABNORMAL LOW (ref 13.0–17.0)
Lymphocytes Relative: 30 %
Lymphs Abs: 2.6 10*3/uL (ref 0.7–4.0)
MCH: 27 pg (ref 26.0–34.0)
MCHC: 32.7 g/dL (ref 30.0–36.0)
MCV: 82.7 fL (ref 80.0–100.0)
Metamyelocytes Relative: 3 %
Monocytes Absolute: 1.5 10*3/uL — ABNORMAL HIGH (ref 0.1–1.0)
Monocytes Relative: 17 %
Myelocytes: 18 %
Neutro Abs: 2.8 10*3/uL (ref 1.7–7.7)
Neutrophils Relative %: 23 %
Platelets: 354 10*3/uL (ref 150–400)
RBC: 3.18 MIL/uL — ABNORMAL LOW (ref 4.22–5.81)
RDW: 14.6 % (ref 11.5–15.5)
WBC: 8.7 10*3/uL (ref 4.0–10.5)
nRBC: 5.7 % — ABNORMAL HIGH (ref 0.0–0.2)

## 2019-05-20 LAB — GLUCOSE, CAPILLARY
Glucose-Capillary: 96 mg/dL (ref 70–99)
Glucose-Capillary: 177 mg/dL — ABNORMAL HIGH (ref 70–99)
Glucose-Capillary: 215 mg/dL — ABNORMAL HIGH (ref 70–99)

## 2019-05-20 LAB — C-REACTIVE PROTEIN: CRP: 1.8 mg/dL — ABNORMAL HIGH (ref <1.0)

## 2019-05-20 LAB — FERRITIN: Ferritin: 1834 ng/mL — ABNORMAL HIGH (ref 24–336)

## 2019-05-20 LAB — D-DIMER, QUANTITATIVE: D-Dimer, Quant: 0.71 ug/mL-FEU — ABNORMAL HIGH (ref 0.00–0.50)

## 2019-05-20 MED ORDER — POTASSIUM CHLORIDE CRYS ER 20 MEQ PO TBCR
40.0000 meq | EXTENDED_RELEASE_TABLET | Freq: Once | ORAL | Status: AC
  Administered 2019-05-20: 40 meq via ORAL
  Filled 2019-05-20: qty 2

## 2019-05-20 NOTE — Progress Notes (Signed)
PROGRESS NOTE  Steve [name] ZSW:[id number] DOB: 1956-03-20 DOA: 05/16/2019  PCP: [name], PA-C  Brief History/Interval Summary: 64 y.o.male, w Hodgkins lymphoma, HTN, DM2, OSA, presented with fever.  Also had alteration in sense of taste and smell.  Patient is getting chemotherapy for Hodgkin's lymphoma.  His last treatment was on January 25.  He also received Neulasta infusion.  Chest x-ray showed subsegmental atelectasis or chronic interstitial thickening.  COVID-19 was positive.  CT chest was positive for multifocal opacities.  Patient was hospitalized due to his immunocompromise status.    Reason for Visit: Pneumonia due to COVID-19.  Neutropenic fever  Consultants: Phone consultation with oncology  Procedures: None  Antibiotics: Anti-infectives (From admission, onward)   Start     Dose/Rate Route Frequency Ordered Stop   05/19/19 1200  vancomycin (VANCOCIN) IVPB 1000 mg/200 mL premix     1,000 mg 200 mL/hr over 60 Minutes Intravenous Every 12 hours 05/19/19 0945     05/18/19 1000  remdesivir 100 mg in sodium chloride 0.9 % 100 mL IVPB     100 mg 200 mL/hr over 30 Minutes Intravenous Daily 05/17/19 0443 05/22/19 0959   05/17/19 1800  vancomycin (VANCOREADY) IVPB 1250 mg/250 mL  Status:  Discontinued     1,250 mg 166.7 mL/hr over 90 Minutes Intravenous Every 24 hours 05/16/19 2005 05/19/19 0945   05/17/19 0600  ceFEPIme (MAXIPIME) 2 g in sodium chloride 0.9 % 100 mL IVPB     2 g 200 mL/hr over 30 Minutes Intravenous Every 8 hours 05/16/19 2005     05/17/19 0515  remdesivir 200 mg in sodium chloride 0.9% 250 mL IVPB     200 mg 580 mL/hr over 30 Minutes Intravenous Once 05/17/19 0443 05/17/19 0605   05/16/19 2041  vancomycin (VANCOCIN) 500 MG powder    Note to Pharmacy: Jaci Carrel   : cabinet override      05/16/19 2041 05/17/19 0844   05/16/19 2030  metroNIDAZOLE (FLAGYL) IVPB 500 mg     500 mg 100 mL/hr over 60 Minutes Intravenous  Once 05/16/19 2018  05/16/19 2246   05/16/19 2015  vancomycin (VANCOREADY) IVPB 500 mg/100 mL     500 mg 100 mL/hr over 60 Minutes Intravenous  Once 05/16/19 2005 05/16/19 2215   05/16/19 1845  ceFEPIme (MAXIPIME) 2 g in sodium chloride 0.9 % 100 mL IVPB     2 g 200 mL/hr over 30 Minutes Intravenous  Once 05/16/19 1842 05/16/19 1955   05/16/19 1845  vancomycin (VANCOCIN) IVPB 1000 mg/200 mL premix     1,000 mg 200 mL/hr over 60 Minutes Intravenous  Once 05/16/19 1842 05/16/19 2029      Subjective/Interval History: Patient states that he is feeling better.  Occasional cough.  No shortness of breath.  No nausea vomiting.    Assessment/Plan:  Pneumonia due to COVID-19  Recent Labs  Lab 05/16/19 1857 05/17/19 0517 05/17/19 1701 05/18/19 0429 05/19/19 0356 05/20/19 0421  DDIMER  --  1.40*  --  0.92* 0.73* 0.71*  FERRITIN  --  1,441*  --  1,678* 2,244* 1,834*  CRP  --  17.1*  --  14.9* 5.3* 1.8*  ALT 30  --  31 25 26  33  PROCALCITON 0.65 0.80  --   --   --   --     Patient remained stable from a respiratory standpoint.  He saturating normal on room air.  He remains on remdesivir and steroids.  CT angiogram showed opacities in  the lungs.  No PE was noted.  He was also started on antibacterials due to elevated procalcitonin and neutropenic fever.  Inflammatory markers have improved.  Continue to mobilize.  Hopefully discharge in 24 to 48 hours.  Neutropenic fever/bacteremia Neutropenia secondary to chemotherapy.  Patient received Neulasta infusion 1/25 along with his chemotherapy.  Neutrophil counts are now normal.  He has been afebrile.  Only coag negative staph identified on blood cultures which is likely a contaminant.  MRSA PCR negative.  Stop vancomycin.  Continue with cefepime for now.  Hopefully will be able to stop this as well.  No other source of infection found.  Will discuss with patient's oncologist.   Hodgkin's lymphoma, stage III Followed by Dr. Maylon Peppers with oncology.  Diagnosed in  November 2020.  Currently on chemotherapy.  Last chemotherapy was on 1/25 when he received doxorubicin, vinblastine, dacarbazine and brentuximab.  He has had 2 cycles so far.  He was supposed to get his third cycle this week however will have to hold off for now.  Diabetes mellitus type 2, uncontrolled with hyperglycemia HbA1c is 8.3.  Elevated CBGs due to steroids.  Patient on Metformin at home which is held.  Patient placed on Levemir due to elevated CBG from steroids.  Noted to be 96 this morning.  Continue to monitor.  May need to decrease the dose of Levemir.  Essential hypertension Holding losartan due to soft blood pressures.  Replace potassium.  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  No evidence of overt bleeding.  Chemotherapy associated diarrhea Imodium as needed.  Has not had any diarrhea in the last 24 hours.  Hyponatremia Resolved with hydration  DVT Prophylaxis: Lovenox Code Status: Full code Family Communication: Wife was updated yesterday.  Will do so again today. Disposition Plan: Continue to mobilize.  Hopefully discharge in 24 to 48 hours.   Medications:  Scheduled: . ascorbic acid  500 mg Oral Daily  . atorvastatin  10 mg Oral Daily  . Chlorhexidine Gluconate Cloth  6 each Topical Daily  . dexamethasone (DECADRON) injection  6 mg Intravenous Daily  . enoxaparin (LOVENOX) injection  40 mg Subcutaneous Daily  . feeding supplement (ENSURE ENLIVE)  237 mL Oral BID BM  . feeding supplement (PRO-STAT SUGAR FREE 64)  30 mL Oral BID  . ferrous sulfate  325 mg Oral Q breakfast  . insulin aspart  0-20 Units Subcutaneous TID WC  . insulin aspart  0-5 Units Subcutaneous QHS  . insulin detemir  10 Units Subcutaneous BID  . zinc sulfate  220 mg Oral Daily   Continuous: . sodium chloride Stopped (05/16/19 2251)  . ceFEPime (MAXIPIME) IV 2 g (05/20/19 0600)  . remdesivir 100 mg in NS 100 mL 100 mg (05/20/19 0808)  .  vancomycin Stopped (05/20/19 0138)   ZTI:WPYKDX chloride, acetaminophen, guaiFENesin-dextromethorphan, LORazepam, prochlorperazine, sodium chloride   Objective:  Vital Signs  Vitals:   05/19/19 1600 05/19/19 1716 05/20/19 0143 05/20/19 0756  BP: 111/74   120/80  Pulse: 63 78  79  Resp: (!) 27 20 20    Temp: 97.7 F (36.5 C)  97.8 F (36.6 C) 98 F (36.7 C)  TempSrc: Oral  Oral Oral  SpO2: 95% 94%  90%  Weight:      Height:        Intake/Output Summary (Last 24 hours) at 05/20/2019 1019 Last data filed at 05/20/2019 0531 Gross per 24 hour  Intake --  Output 800 ml  Net -800 ml   Filed Weights   05/16/19 1819  Weight: 77.6 kg    General appearance: Awake alert.  In no distress Resp: Coarse breath sounds with a few crackles at the bases.  Normal effort at rest.  No wheezing or rhonchi. Cardio: S1-S2 is normal regular.  No S3-S4.  No rubs murmurs or bruit GI: Abdomen is soft.  Nontender nondistended.  Bowel sounds are present normal.  No masses organomegaly Extremities: No edema.  Full range of motion of lower extremities. Neurologic: Alert and oriented x3.  No focal neurological deficits.     Lab Results:  Data Reviewed: I have personally reviewed following labs and imaging studies  CBC: Recent Labs  Lab 05/16/19 1857 05/17/19 1701 05/18/19 0429 05/19/19 0356 05/20/19 0421  WBC 0.8* 0.5* 1.4* 3.3* 8.7  NEUTROABS 0.0* 0.1* 0.1* 0.5* 2.8  HGB 8.9* 9.0* 8.3* 8.7* 8.6*  HCT 26.8* 27.6* 26.0* 26.7* 26.3*  MCV 83.0 83.9 85.2 83.4 82.7  PLT 213 234 277 342 767    Basic Metabolic Panel: Recent Labs  Lab 05/16/19 1857 05/17/19 1701 05/18/19 0429 05/19/19 0356 05/20/19 0421  NA 127* 133* 136 139 139  K 3.6 3.9 3.5 4.0 3.6  CL 95* 102 102 107 105  CO2 23 22 20* 25 26  GLUCOSE 173* 313* 182* 176* 154*  BUN 10 15 15 19 21   CREATININE 0.92 0.81 0.82 0.81 0.67  CALCIUM 7.9* 8.3* 8.0* 8.6* 8.6*    GFR: Estimated Creatinine Clearance: 92.6 mL/min (by C-G  formula based on SCr of 0.67 mg/dL).  Liver Function Tests: Recent Labs  Lab 05/16/19 1857 05/17/19 1701 05/18/19 0429 05/19/19 0356 05/20/19 0421  AST 18 21 17 20 27   ALT 30 31 25 26  33  ALKPHOS 47 51 45 44 45  BILITOT 0.9 0.6 0.4 0.5 0.3  PROT 6.1* 6.4* 5.7* 5.6* 5.4*  ALBUMIN 2.8* 2.7* 2.5* 2.4* 2.5*    HbA1C: Recent Labs    05/19/19 0356  HGBA1C 8.3*    CBG: Recent Labs  Lab 05/19/19 0813 05/19/19 1141 05/19/19 1621 05/19/19 2156 05/20/19 0758  GLUCAP 228* 199* 191* 294* 96    Anemia Panel: Recent Labs    05/17/19 1701 05/18/19 0429 05/19/19 0356 05/20/19 0421  VITAMINB12 1,380*  --   --   --   FOLATE 11.4  --   --   --   FERRITIN  --    < > 2,244* 1,834*  TIBC 164*  --   --   --   IRON 19*  --   --   --    < > = values in this interval not displayed.    Recent Results (from the past 240 hour(s))  Urine culture     Status: None   Collection Time: 05/16/19  6:57 PM   Specimen: In/Out Cath Urine  Result Value Ref Range Status   Specimen Description   Final    IN/OUT CATH URINE Performed at [facility], [address] [place], [place] [address]    Special Requests   Final    NONE Performed at [facility], [address] [place], [place] [address]    Culture   Final    NO GROWTH Performed at [facility], [place] [address] [place], [place] [address]    Report Status 05/17/2019 FINAL  Final  SARS Coronavirus 2 by RT PCR (hospital order, performed in [facility] hospital lab) Nasopharyngeal Nasopharyngeal  Swab     Status: Abnormal   Collection Time: 05/16/19  6:57 PM   Specimen: Nasopharyngeal Swab  Result Value Ref Range Status   SARS Coronavirus 2 POSITIVE (A) NEGATIVE Final    Comment: RESULT CALLED TO, READ BACK BY AND VERIFIED WITH: Patsy Baltimore RN @2039  05/16/2019 OLSONM Performed at [facility], [address] [place], [place] [address]   Blood Culture (routine x 2)     Status: Abnormal    Collection Time: 05/16/19  6:58 PM   Specimen: BLOOD  Result Value Ref Range Status   Specimen Description   Final    BLOOD LEFT ANTECUBITAL Performed at [facility], [address] [place], [place] [address]    Special Requests   Final    BOTTLES DRAWN AEROBIC AND ANAEROBIC Blood Culture adequate volume Performed at [facility], [address] [place], [place] [address]    Culture  Setup Time   Final    AEROBIC BOTTLE ONLY GRAM POSITIVE COCCI CRITICAL RESULT CALLED TO, READ BACK BY AND VERIFIED WITH: M BELL PHARMD 05/17/19 1931 JDW    Culture (A)  Final    STAPHYLOCOCCUS SPECIES (COAGULASE NEGATIVE) THE SIGNIFICANCE OF ISOLATING THIS ORGANISM FROM A SINGLE SET OF BLOOD CULTURES WHEN MULTIPLE SETS ARE DRAWN IS UNCERTAIN. PLEASE NOTIFY THE MICROBIOLOGY DEPARTMENT WITHIN ONE WEEK IF SPECIATION AND SENSITIVITIES ARE REQUIRED. Performed at [facility], [place] [address], [place], [place] [address]    Report Status 05/18/2019 FINAL  Final  Blood Culture (routine x 2)     Status: None (Preliminary result)   Collection Time: 05/16/19  6:58 PM   Specimen: BLOOD  Result Value Ref Range Status   Specimen Description   Final    BLOOD RIGHT ANTECUBITAL Performed at [facility], [address] [place], [place] [address]    Special Requests   Final    BOTTLES DRAWN AEROBIC AND ANAEROBIC Blood Culture results may not be optimal due to an excessive volume of blood received in culture bottles Performed at [facility], [address] [place], [place] [address]    Culture   Final    NO GROWTH 3 DAYS Performed at [facility], [place] [address], [place], [place] [address]    Report Status PENDING  Incomplete  MRSA PCR Screening     Status: None   Collection Time: 05/19/19  9:39 AM   Specimen: Nasopharyngeal  Result Value Ref Range Status   MRSA by PCR NEGATIVE NEGATIVE Final    Comment:        The GeneXpert MRSA Assay (FDA approved for  NASAL specimens only), is one component of a comprehensive MRSA colonization surveillance program. It is not intended to diagnose MRSA infection nor to guide or monitor treatment for MRSA infections. Performed at [facility], [place] [address] [place], [place] [address]       Radiology Studies: No results found.     LOS: 4 days   Valor Quaintance [employer] on [email or web address]  05/20/2019, 10:19 AM

## 2019-05-20 NOTE — Progress Notes (Signed)
Physical Therapy Evaluation Patient Details Name: Steve [name] MRN: [id number] DOB: [birth year] Today's Date: 05/20/2019   History of Present Illness  64 y.o. male, w Hodgkins lymphoma, HTN, DM2, OSA, presented with fever.  Also had alteration in sense of taste and smell.  Patient is getting chemotherapy for Hodgkin's lymphoma.  His last treatment was on January 25.  He also received Neulasta infusion.  Chest x-ray showed subsegmental atelectasis or chronic interstitial thickening.  COVID-19 was positive.  CT chest was positive for multifocal opacities.  Patient was hospitalized due to his immunocompromise status.    Clinical Impression  Patient demonstrated high motivation and safe functional mobility during tx today. He was able to maintain >90% O2 saturation during ambulation with 1L/M 329ft (2 laps around ward). He demonstrated no near LOB (with hard external perturbations) and scored max on BERG balance indicating low fall risk. He remained stable off of oxygen when reviewing standing and breathing exercises after ambulation. Pt verbalized understanding to continued self mobility and to remain as active as possible and calling RN intermittently to walk with him if he needs exercise.  RN notified of patient performance and removal of O2 for rest due to solid O2. Pt also helped self manage leads during mobility. Will assess stair performance and O2 requirements if patient remains >48 hours, otherwise patient is currently safe to ambulate with any staff.    Follow Up Recommendations No PT follow up    Equipment Recommendations  None recommended by PT    Recommendations for Other Services       Precautions / Restrictions Precautions Precautions: Fall Precaution Comments: Airborne Restrictions Weight Bearing Restrictions: No      Mobility  Bed Mobility Overal bed mobility: Independent                Transfers Overall transfer level: Independent                   Ambulation/Gait Ambulation/Gait assistance: Supervision Gait Distance (Feet): 300 Feet Assistive device: None Gait Pattern/deviations: WFL(Within Functional Limits)   Gait velocity interpretation: >2.62 ft/sec, indicative of community ambulatory General Gait Details: Pt demonstrated normal gait speed, full step-through gait, with no deviations noted.  Stairs            Wheelchair Mobility    Modified Rankin (Stroke Patients Only)       Balance Overall balance assessment: Independent                               Standardized Balance Assessment Standardized Balance Assessment : Berg Balance Test Berg Balance Test Sit to Stand: Able to stand without using hands and stabilize independently Standing Unsupported: Able to stand safely 2 minutes Sitting with Back Unsupported but Feet Supported on Floor or Stool: Able to sit safely and securely 2 minutes Stand to Sit: Sits safely with minimal use of hands Transfers: Able to transfer safely, minor use of hands Standing Unsupported with Eyes Closed: Able to stand 10 seconds safely Standing Ubsupported with Feet Together: Able to place feet together independently and stand 1 minute safely From Standing, Reach Forward with Outstretched Arm: Can reach confidently >25 cm (10") From Standing Position, Pick up Object from Floor: Able to pick up shoe safely and easily From Standing Position, Turn to Look Behind Over each Shoulder: Looks behind from both sides and weight shifts well Turn 360 Degrees: Able to turn 360 degrees safely in 4 seconds  or less Standing Unsupported, Alternately Place Feet on Step/Stool: Able to stand independently and safely and complete 8 steps in 20 seconds Standing Unsupported, One Foot in Front: Able to place foot tandem independently and hold 30 seconds Standing on One Leg: Able to lift leg independently and hold > 10 seconds Total Score: 56         Pertinent Vitals/Pain Pain Assessment:  No/denies pain    Home Living Family/patient expects to be discharged to:: Private residence Living Arrangements: Spouse/significant other;Children(sister) Available Help at Discharge: Family Type of Home: Mobile home Home Access: Stairs to enter(6 for sisters residence) Entrance Stairs-Rails: Left   Home Layout: One level Home Equipment: Grab bars - tub/shower Additional Comments: tub/shower combo, grab bar, standard toilet (Patient plans to d/c to sister's home: single story, 5 steps)    Prior Function Level of Independence: Independent(sister has a walker at home)               Hand Dominance   Dominant Hand: Right    Extremity/Trunk Assessment   Upper Extremity Assessment Upper Extremity Assessment: Defer to OT evaluation    Lower Extremity Assessment Lower Extremity Assessment: Generalized weakness(LE grossly 4/5 bilaterally, need to assess safety with stair)       Communication   Communication: No difficulties  Cognition Arousal/Alertness: Awake/alert Behavior During Therapy: WFL for tasks assessed/performed Overall Cognitive Status: Within Functional Limits for tasks assessed                                        General Comments General comments (skin integrity, edema, etc.): Pt was on 1L/m O2 [place] upon entry. Pt was willing to participate with PT with vitals remaining >90% O2 sats with ambulation of 323ft and 1 standing rest break on 1 L/M.  Pt O2 sats only drops below 90% when blowing his nose (85%).  Pt was very steady with balance testing, demonstrating no near LOB with severe external perturbations.    Exercises General Exercises - Lower Extremity Toe Raises: AROM;10 reps;Seated Mini-Sqauts: Strengthening;10 reps;Standing Other Exercises Other Exercises: incentive spirometer to 1737mL for 5 times  Other Exercises: flutter valve 5 reps for hold time of 6 seonds(discussed rationale for use) Other Exercises: pursed lip breathing for  self management during walking   Assessment/Plan    PT Assessment Patient needs continued PT services(assess safety with stairs, hand outs for breathing, & LE ex)  PT Problem List Decreased strength;Decreased activity tolerance;Cardiopulmonary status limiting activityGeneralized LE weakness       PT Treatment Interventions Stair training;Therapeutic exercise;Therapeutic activities;Balance training;Neuromuscular re-education;Patient/family education;Gait training;DME instruction;Functional mobility training;Cognitive remediation    PT Goals (Current goals can be found in the Care Plan section)  Acute Rehab PT Goals Patient Stated Goal: to go to sisters home PT Goal Formulation: With patient Time For Goal Achievement: 06/03/19 Potential to Achieve Goals: Good    Frequency Min 2X/week   Barriers to discharge        Co-evaluation               AM-PAC PT "6 Clicks" Mobility  Outcome Measure Help needed turning from your back to your side while in a flat bed without using bedrails?: None Help needed moving from lying on your back to sitting on the side of a flat bed without using bedrails?: None Help needed moving to and from a bed to a chair (including a wheelchair)?:  None Help needed standing up from a chair using your arms (e.g., wheelchair or bedside chair)?: None Help needed to walk in hospital room?: None Help needed climbing 3-5 steps with a railing? : None 6 Click Score: 24    End of Session Equipment Utilized During Treatment: Oxygen(Used 1L/M initially, turned off for room mobility) Activity Tolerance: Patient tolerated treatment well Patient left: in bed;with call bell/phone within reach Nurse Communication: Mobility status;Other (comment)(Encouraged patient to ambulate on own without assistance) PT Visit Diagnosis: Muscle weakness (generalized) (M62.81)    Time: 4496-7591 PT Time Calculation (min) (ACUTE ONLY): 23 min   Charges:    Gait 8-22   Thera Act  8-22       Ann Held PT, DPT [place] P: [place] 05/20/2019, 6:45 PM

## 2019-05-20 NOTE — Progress Notes (Signed)
Occupational Therapy Evaluation Patient Details Name: Steve [name] MRN: [id number] DOB: 1955/07/12 Today's Date: 05/20/2019    History of Present Illness 64 y.o. male, w Hodgkins lymphoma, HTN, DM2, OSA, presented with fever.  Also had alteration in sense of taste and smell.  Patient is getting chemotherapy for Hodgkin's lymphoma.  His last treatment was on January 25.  He also received Neulasta infusion.  Chest x-ray showed subsegmental atelectasis or chronic interstitial thickening.  COVID-19 was positive.  CT chest was positive for multifocal opacities.  Patient was hospitalized due to his immunocompromise status.     Clinical Impression   PTA  Patient lives at home with spouse and children in mobile home with a few steps to enter. Patient reports being Independent with all self-care tasks and functional mobility. Patient has been recently living in sisters home for family assist with chemotherapy sessions. Patient's O2 stats dropped to low 80s while standing at sink on 2L of O2 with recovery time of about 20 seconds with pursed lip breathing. Patient was able to mobilize in room for about 30 feet on room air and was able to maintain O2 stas above 90%. Patient educated on energy conservation and pursed lip breathing to incorporate with routine tasks. Patient plans to d/c to sister's home with recommendation of no further OT services.     Follow Up Recommendations  No OT follow up    Equipment Recommendations  3 in 1 bedside commode    Recommendations for Other Services       Precautions / Restrictions Precautions Precautions: Fall Precaution Comments: Airborne Restrictions Weight Bearing Restrictions: No      Mobility Bed Mobility                  Transfers Overall transfer level: Modified independent Equipment used: (oxygen )                  Balance Overall balance assessment: Modified Independent                                          ADL either performed or assessed with clinical judgement   ADL Overall ADL's : Needs assistance/impaired Eating/Feeding: Independent   Grooming: Set up   Upper Body Bathing: Set up   Lower Body Bathing: Set up   Upper Body Dressing : Independent   Lower Body Dressing: Supervision/safety   Toilet Transfer: Supervision/safety   Toileting- Clothing Manipulation and Hygiene: Modified independent       Functional mobility during ADLs: Modified independent       Vision Baseline Vision/History: No visual deficits Patient Visual Report: No change from baseline       Perception     Praxis      Pertinent Vitals/Pain Pain Assessment: No/denies pain     Hand Dominance Right   Extremity/Trunk Assessment Upper Extremity Assessment Upper Extremity Assessment: Generalized weakness;RUE deficits/detail;LUE deficits/detail RUE: (Grossly 4-/5 for R UE, WFL for AROM ) LUE: (Grossly 4-/5 for L UE, WFL for AROM )   Lower Extremity Assessment Lower Extremity Assessment: Defer to PT evaluation       Communication Communication Communication: No difficulties   Cognition Arousal/Alertness: Awake/alert Behavior During Therapy: WFL for tasks assessed/performed Overall Cognitive Status: Within Functional Limits for tasks assessed  General Comments       Exercises Exercises: Other exercises Other Exercises Other Exercises: incentive spirometer to 1771mL for 5 times  Other Exercises: flutter valve 10 reps for hold time of 6 seonds   Shoulder Instructions      Home Living Family/patient expects to be discharged to:: Private residence Living Arrangements: Spouse/significant other;Children Available Help at Discharge: Family Type of Home: Mobile home Home Access: Stairs to enter Technical brewer of Steps: (3) Entrance Stairs-Rails: Left Home Layout: One level     Bathroom Shower/Tub: Engineer, site: Standard Bathroom Accessibility: Yes How Accessible: Accessible via wheelchair;Accessible via walker Home Equipment: Grab bars - tub/shower   Additional Comments: tub/shower combo, grab bar, standard toilet (Patient plans to d/c to sister's home: single story, 5 steps)      Prior Functioning/Environment Level of Independence: Independent                 OT Problem List: Decreased strength;Decreased activity tolerance;Decreased knowledge of use of DME or AE;Cardiopulmonary status limiting activity      OT Treatment/Interventions: Self-care/ADL training;Therapeutic exercise;Therapeutic activities;Energy conservation;DME and/or AE instruction;Patient/family education    OT Goals(Current goals can be found in the care plan section) Acute Rehab OT Goals Patient Stated Goal: to go to sisters home OT Goal Formulation: With patient Time For Goal Achievement: 06/03/19 Potential to Achieve Goals: Good  OT Frequency: Min 3X/week   Barriers to D/C:            Co-evaluation              AM-PAC OT "6 Clicks" Daily Activity     Outcome Measure Help from another person eating meals?: None Help from another person taking care of personal grooming?: A Little Help from another person toileting, which includes using toliet, bedpan, or urinal?: None Help from another person bathing (including washing, rinsing, drying)?: A Little Help from another person to put on and taking off regular upper body clothing?: None Help from another person to put on and taking off regular lower body clothing?: A Little 6 Click Score: 21   End of Session Equipment Utilized During Treatment: Gait belt Nurse Communication: Mobility status(O2 stats)  Activity Tolerance: Patient tolerated treatment well Patient left: in chair;with call bell/phone within reach  OT Visit Diagnosis: Muscle weakness (generalized) (M62.81)                Time: 3818-2993 OT Time Calculation (min): 52 min Charges:   OT General Charges $OT Visit: 1 Visit OT Evaluation $OT Eval Moderate Complexity: 1 Mod OT Treatments $Self Care/Home Management : 8-22 mins $Therapeutic Exercise: 8-22 mins  Iyannah Blake OTR/L  Jamariyah Johannsen 05/20/2019, 12:54 PM

## 2019-05-21 ENCOUNTER — Inpatient Hospital Stay (HOSPITAL_COMMUNITY): Payer: BC Managed Care – PPO

## 2019-05-21 ENCOUNTER — Other Ambulatory Visit: Payer: Self-pay | Admitting: Hematology

## 2019-05-21 DIAGNOSIS — C8118 Nodular sclerosis classical Hodgkin lymphoma, lymph nodes of multiple sites: Secondary | ICD-10-CM

## 2019-05-21 LAB — COMPREHENSIVE METABOLIC PANEL
ALT: 34 U/L (ref 0–44)
AST: 25 U/L (ref 15–41)
Albumin: 2.6 g/dL — ABNORMAL LOW (ref 3.5–5.0)
Alkaline Phosphatase: 50 U/L (ref 38–126)
Anion gap: 6 (ref 5–15)
BUN: 20 mg/dL (ref 8–23)
CO2: 27 mmol/L (ref 22–32)
Calcium: 8.8 mg/dL — ABNORMAL LOW (ref 8.9–10.3)
Chloride: 105 mmol/L (ref 98–111)
Creatinine, Ser: 0.75 mg/dL (ref 0.61–1.24)
GFR calc Af Amer: >60 mL/min (ref >60)
GFR calc non Af Amer: >60 mL/min (ref >60)
Glucose, Bld: 136 mg/dL — ABNORMAL HIGH (ref 70–99)
Potassium: 4.3 mmol/L (ref 3.5–5.1)
Sodium: 138 mmol/L (ref 135–145)
Total Bilirubin: 0.7 mg/dL (ref 0.3–1.2)
Total Protein: 5.8 g/dL — ABNORMAL LOW (ref 6.5–8.1)

## 2019-05-21 LAB — GLUCOSE, CAPILLARY
Glucose-Capillary: 246 mg/dL — ABNORMAL HIGH (ref 70–99)
Glucose-Capillary: 179 mg/dL — ABNORMAL HIGH (ref 70–99)
Glucose-Capillary: 272 mg/dL — ABNORMAL HIGH (ref 70–99)
Glucose-Capillary: 293 mg/dL — ABNORMAL HIGH (ref 70–99)
Glucose-Capillary: 229 mg/dL — ABNORMAL HIGH (ref 70–99)

## 2019-05-21 LAB — CBC WITH DIFFERENTIAL/PLATELET
Abs Immature Granulocytes: 2.4 10*3/uL — ABNORMAL HIGH (ref 0.00–0.07)
Band Neutrophils: 5 %
Basophils Absolute: 0 10*3/uL (ref 0.0–0.1)
Basophils Relative: 0 %
Eosinophils Absolute: 0.1 10*3/uL (ref 0.0–0.5)
Eosinophils Relative: 1 %
HCT: 28.7 % — ABNORMAL LOW (ref 39.0–52.0)
Hemoglobin: 9.3 g/dL — ABNORMAL LOW (ref 13.0–17.0)
Lymphocytes Relative: 18 %
Lymphs Abs: 2.6 10*3/uL (ref 0.7–4.0)
MCH: 27.4 pg (ref 26.0–34.0)
MCHC: 32.4 g/dL (ref 30.0–36.0)
MCV: 84.7 fL (ref 80.0–100.0)
Metamyelocytes Relative: 2 %
Monocytes Absolute: 2.9 10*3/uL — ABNORMAL HIGH (ref 0.1–1.0)
Monocytes Relative: 20 %
Myelocytes: 14 %
Neutro Abs: 6.3 10*3/uL (ref 1.7–7.7)
Neutrophils Relative %: 39 %
Platelets: 362 10*3/uL (ref 150–400)
Promyelocytes Relative: 1 %
RBC: 3.39 MIL/uL — ABNORMAL LOW (ref 4.22–5.81)
RDW: 15.3 % (ref 11.5–15.5)
WBC: 14.3 10*3/uL — ABNORMAL HIGH (ref 4.0–10.5)
nRBC: 3.1 % — ABNORMAL HIGH (ref 0.0–0.2)

## 2019-05-21 LAB — D-DIMER, QUANTITATIVE: D-Dimer, Quant: 0.68 ug/mL-FEU — ABNORMAL HIGH (ref 0.00–0.50)

## 2019-05-21 LAB — MAGNESIUM: Magnesium: 1.6 mg/dL — ABNORMAL LOW (ref 1.7–2.4)

## 2019-05-21 LAB — CULTURE, BLOOD (ROUTINE X 2): Culture: NO GROWTH

## 2019-05-21 MED ORDER — MAGNESIUM SULFATE 2 GM/50ML IV SOLN
2.0000 g | Freq: Once | INTRAVENOUS | Status: AC
  Administered 2019-05-21: 2 g via INTRAVENOUS
  Filled 2019-05-21: qty 50

## 2019-05-21 MED ORDER — FERROUS SULFATE 325 (65 FE) MG PO TABS: 325.0000 mg | ORAL_TABLET | Freq: Every day | ORAL | 1 refills | Status: DC

## 2019-05-21 MED ORDER — MAGNESIUM OXIDE 400 MG PO CAPS: 400.0000 mg | ORAL_CAPSULE | Freq: Two times a day (BID) | ORAL | 0 refills | Status: AC

## 2019-05-21 MED ORDER — DEXAMETHASONE 2 MG PO TABS: ORAL_TABLET | ORAL | 0 refills | Status: DC

## 2019-05-21 NOTE — Progress Notes (Signed)
   05/21/19 1113  Family/Significant Other Communication  Family/Significant Other Update Called;Updated  Call spouse no answer

## 2019-05-21 NOTE — Discharge Instructions (Signed)
COVID-19 COVID-19 is a respiratory infection that is caused by a virus called severe acute respiratory syndrome coronavirus 2 (SARS-CoV-2). The disease is also known as coronavirus disease or novel coronavirus. In some people, the virus may not cause any symptoms. In others, it may cause a serious infection. The infection can get worse quickly and can lead to complications, such as:  Pneumonia, or infection of the lungs.  Acute respiratory distress syndrome or ARDS. This is a condition in which fluid build-up in the lungs prevents the lungs from filling with air and passing oxygen into the blood.  Acute respiratory failure. This is a condition in which there is not enough oxygen passing from the lungs to the body or when carbon dioxide is not passing from the lungs out of the body.  Sepsis or septic shock. This is a serious bodily reaction to an infection.  Blood clotting problems.  Secondary infections due to bacteria or fungus.  Organ failure. This is when your body's organs stop working. The virus that causes COVID-19 is contagious. This means that it can spread from person to person through droplets from coughs and sneezes (respiratory secretions). What are the causes? This illness is caused by a virus. You may catch the virus by:  Breathing in droplets from an infected person. Droplets can be spread by a person breathing, speaking, singing, coughing, or sneezing.  Touching something, like a table or a doorknob, that was exposed to the virus (contaminated) and then touching your mouth, nose, or eyes. What increases the risk? Risk for infection You are more likely to be infected with this virus if you:  Are within 6 feet (2 meters) of a person with COVID-19.  Provide care for or live with a person who is infected with COVID-19.  Spend time in crowded indoor spaces or live in shared housing. Risk for serious illness You are more likely to become seriously ill from the virus if you:   Are 50 years of age or older. The higher your age, the more you are at risk for serious illness.  Live in a nursing home or long-term care facility.  Have cancer.  Have a long-term (chronic) disease such as: ? Chronic lung disease, including chronic obstructive pulmonary disease or asthma. ? A long-term disease that lowers your body's ability to fight infection (immunocompromised). ? Heart disease, including heart failure, a condition in which the arteries that lead to the heart become narrow or blocked (coronary artery disease), a disease which makes the heart muscle thick, weak, or stiff (cardiomyopathy). ? Diabetes. ? Chronic kidney disease. ? Sickle cell disease, a condition in which red blood cells have an abnormal "sickle" shape. ? Liver disease.  Are obese. What are the signs or symptoms? Symptoms of this condition can range from mild to severe. Symptoms may appear any time from 2 to 14 days after being exposed to the virus. They include:  A fever or chills.  A cough.  Difficulty breathing.  Headaches, body aches, or muscle aches.  Runny or stuffy (congested) nose.  A sore throat.  New loss of taste or smell. Some people may also have stomach problems, such as nausea, vomiting, or diarrhea. Other people may not have any symptoms of COVID-19. How is this diagnosed? This condition may be diagnosed based on:  Your signs and symptoms, especially if: ? You live in an area with a COVID-19 outbreak. ? You recently traveled to or from an area where the virus is common. ? You   provide care for or live with a person who was diagnosed with COVID-19. ? You were exposed to a person who was diagnosed with COVID-19.  A physical exam.  Lab tests, which may include: ? Taking a sample of fluid from the back of your nose and throat (nasopharyngeal fluid), your nose, or your throat using a swab. ? A sample of mucus from your lungs (sputum). ? Blood tests.  Imaging tests, which  may include, X-rays, CT scan, or ultrasound. How is this treated? At present, there is no medicine to treat COVID-19. Medicines that treat other diseases are being used on a trial basis to see if they are effective against COVID-19. Your health care provider will talk with you about ways to treat your symptoms. For most people, the infection is mild and can be managed at home with rest, fluids, and over-the-counter medicines. Treatment for a serious infection usually takes places in a hospital intensive care unit (ICU). It may include one or more of the following treatments. These treatments are given until your symptoms improve.  Receiving fluids and medicines through an IV.  Supplemental oxygen. Extra oxygen is given through a tube in the nose, a face mask, or a hood.  Positioning you to lie on your stomach (prone position). This makes it easier for oxygen to get into the lungs.  Continuous positive airway pressure (CPAP) or bi-level positive airway pressure (BPAP) machine. This treatment uses mild air pressure to keep the airways open. A tube that is connected to a motor delivers oxygen to the body.  Ventilator. This treatment moves air into and out of the lungs by using a tube that is placed in your windpipe.  Tracheostomy. This is a procedure to create a hole in the neck so that a breathing tube can be inserted.  Extracorporeal membrane oxygenation (ECMO). This procedure gives the lungs a chance to recover by taking over the functions of the heart and lungs. It supplies oxygen to the body and removes carbon dioxide. Follow these instructions at home: Lifestyle  If you are sick, stay home except to get medical care. Your health care provider will tell you how long to stay home. Call your health care provider before you go for medical care.  Rest at home as told by your health care provider.  Do not use any products that contain nicotine or tobacco, such as cigarettes, e-cigarettes, and  chewing tobacco. If you need help quitting, ask your health care provider.  Return to your normal activities as told by your health care provider. Ask your health care provider what activities are safe for you. General instructions  Take over-the-counter and prescription medicines only as told by your health care provider.  Drink enough fluid to keep your urine pale yellow.  Keep all follow-up visits as told by your health care provider. This is important. How is this prevented?  There is no vaccine to help prevent COVID-19 infection. However, there are steps you can take to protect yourself and others from this virus. To protect yourself:   Do not travel to areas where COVID-19 is a risk. The areas where COVID-19 is reported change often. To identify high-risk areas and travel restrictions, check the CDC travel website: wwwnc.cdc.gov/travel/notices  If you live in, or must travel to, an area where COVID-19 is a risk, take precautions to avoid infection. ? Stay away from people who are sick. ? Wash your hands often with soap and water for 20 seconds. If soap and water   are not available, use an alcohol-based hand sanitizer. ? Avoid touching your mouth, face, eyes, or nose. ? Avoid going out in public, follow guidance from your state and local health authorities. ? If you must go out in public, wear a cloth face covering or face mask. Make sure your mask covers your nose and mouth. ? Avoid crowded indoor spaces. Stay at least 6 feet (2 meters) away from others. ? Disinfect objects and surfaces that are frequently touched every day. This may include:  Counters and tables.  Doorknobs and light switches.  Sinks and faucets.  Electronics, such as phones, remote controls, keyboards, computers, and tablets. To protect others: If you have symptoms of COVID-19, take steps to prevent the virus from spreading to others.  If you think you have a COVID-19 infection, contact your health care  provider right away. Tell your health care team that you think you may have a COVID-19 infection.  Stay home. Leave your house only to seek medical care. Do not use public transport.  Do not travel while you are sick.  Wash your hands often with soap and water for 20 seconds. If soap and water are not available, use alcohol-based hand sanitizer.  Stay away from other members of your household. Let healthy household members care for children and pets, if possible. If you have to care for children or pets, wash your hands often and wear a mask. If possible, stay in your own room, separate from others. Use a different bathroom.  Make sure that all people in your household wash their hands well and often.  Cough or sneeze into a tissue or your sleeve or elbow. Do not cough or sneeze into your hand or into the air.  Wear a cloth face covering or face mask. Make sure your mask covers your nose and mouth. Where to find more information  Centers for Disease Control and Prevention: www.cdc.gov/coronavirus/2019-ncov/index.html  World Health Organization: www.who.int/health-topics/coronavirus Contact a health care provider if:  You live in or have traveled to an area where COVID-19 is a risk and you have symptoms of the infection.  You have had contact with someone who has COVID-19 and you have symptoms of the infection. Get help right away if:  You have trouble breathing.  You have pain or pressure in your chest.  You have confusion.  You have bluish lips and fingernails.  You have difficulty waking from sleep.  You have symptoms that get worse. These symptoms may represent a serious problem that is an emergency. Do not wait to see if the symptoms will go away. Get medical help right away. Call your local emergency services (911 in the [place] Do not drive yourself to the hospital. Let the emergency medical personnel know if you think you have COVID-19. Summary  COVID-19 is a  respiratory infection that is caused by a virus. It is also known as coronavirus disease or novel coronavirus. It can cause serious infections, such as pneumonia, acute respiratory distress syndrome, acute respiratory failure, or sepsis.  The virus that causes COVID-19 is contagious. This means that it can spread from person to person through droplets from breathing, speaking, singing, coughing, or sneezing.  You are more likely to develop a serious illness if you are 50 years of age or older, have a weak immune system, live in a nursing home, or have chronic disease.  There is no medicine to treat COVID-19. Your health care provider will talk with you about ways to treat your symptoms.    Take steps to protect yourself and others from infection. Wash your hands often and disinfect objects and surfaces that are frequently touched every day. Stay away from people who are sick and wear a mask if you are sick. This information is not intended to replace advice given to you by your health care provider. Make sure you discuss any questions you have with your health care provider. Document Revised: 01/25/2019 Document Reviewed: 05/03/2018 Elsevier Patient Education  2020 Elsevier Inc.  

## 2019-05-21 NOTE — TOC Initial Note (Signed)
Transition of Care [facility]) - Initial/Assessment Note    Patient Details  Name: Steve [name] MRN: [id number] Date of Birth: March 30, 1956  [facility] CM/SW Contact:    Benard Halsted, LCSW Phone Number: 05/21/2019, 3:17 PM  Clinical Narrative:                 Covering CSW contacted patient regarding oxygen set up at home. Patient reported that he will be going to his sister's house: [address], [place]. Sister, Cephas Darby: [phone]. Patient declined 3in1. CSW sent oxygen referral to [facility]. They will deliver the oxygen to patient's sister's house and then patient's ride will pick up the portable tank from there to bring to pick patient up. No other needs identified.  Expected Discharge Plan: Home/Self Care Barriers to Discharge: No Barriers Identified   Patient Goals and CMS Choice Patient states their goals for this hospitalization and ongoing recovery are:: Return home CMS Medicare.gov Compare Post Acute Care list provided to:: Patient Choice offered to / list presented to : Patient  Expected Discharge Plan and Services Expected Discharge Plan: Home/Self Care   Discharge Planning Services: CM Consult Post Acute Care Choice: Durable Medical Equipment Living arrangements for the past 2 months: Single Family Home Expected Discharge Date: 05/21/19               DME Arranged: Oxygen DME Agency: [facility] Date DME Agency Contacted: 05/21/19 Time DME Agency Contacted: [phone] Representative spoke with at DME Agency: [place]: NA          Prior Living Arrangements/Services Living arrangements for the past 2 months: [place] Lives with:: Siblings Patient language and need for interpreter reviewed:: Yes Do you feel safe going back to the place where you live?: Yes      Need for Family Participation in Patient Care: No (Comment) Care giver support system in place?: Yes (comment)   Criminal Activity/Legal Involvement Pertinent to Current  Situation/Hospitalization: No - Comment as needed  Activities of Daily Living Home Assistive Devices/Equipment: None ADL Screening (condition at time of admission) Patient's cognitive ability adequate to safely complete daily activities?: Yes Is the patient deaf or have difficulty hearing?: No Does the patient have difficulty seeing, even when wearing glasses/contacts?: No Does the patient have difficulty concentrating, remembering, or making decisions?: No Patient able to express need for assistance with ADLs?: Yes Does the patient have difficulty dressing or bathing?: No Independently performs ADLs?: Yes (appropriate for developmental age) Does the patient have difficulty walking or climbing stairs?: No Weakness of Legs: None Weakness of Arms/Hands: None  Permission Sought/Granted Permission sought to share information with : Customer service manager, Family Supports       Permission granted to share info w AGENCY: Oxygen agency        Emotional Assessment   Attitude/Demeanor/Rapport: Engaged, Gracious   Orientation: : Oriented to Self, Oriented to Place, Oriented to  Time, Oriented to Situation Alcohol / Substance Use: Not Applicable Psych Involvement: No (comment)  Admission diagnosis:  Neutropenic fever ([place]) [D70.9, R50.81] COVID-19 [U07.1] Febrile neutropenia ([place]) [D70.9, R50.81] Patient Active Problem List   Diagnosis Date Noted  . Febrile neutropenia ([place]) 05/17/2019  . COVID-19 05/17/2019  . Anemia 05/17/2019  . Hyponatremia 05/17/2019  . Protein-calorie malnutrition, severe ([place]) 05/17/2019  . Neutropenic fever ([place]) 05/16/2019  . 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 Chemotherapy-induced diarrhea 04/22/2019  . Anemia due to antineoplastic chemotherapy 03/25/2019  . Hodgkin lymphoma ([place]) 02/25/2019  . Abnormal  CT scan, pancreas - tail area 01/31/2013  . 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 Septic arthritis of hip ([place])  09/05/2012  . Tick bite of abdomen 11/09/2011  . Routine general medical examination at a health care facility 09/12/2011  . Elbow pain 09/12/2011  . S/P PICC central line placement 08/10/2011  . Abscess of muscle 08/10/2011  . H/O dental abscess 08/10/2011  . Staphylococcus aureus bacteremia 07/27/2011  . Elevated BP 09/07/2010  . Abdominal pain, RLQ 09/02/2010  . HYPOGONADISM, MALE 03/23/2007  . IMPAIRED GLUCOSE TOLERANCE 03/23/2007  . SLEEP APNEA 03/23/2007   PCP:  [name], PA-C Pharmacy:   [id number] [place], [address] [place] [address] Phone: [phone] Fax: [phone]     Social Determinants of Health (SDOH) Interventions    Readmission Risk Interventions No flowsheet data found.

## 2019-05-21 NOTE — Progress Notes (Signed)
Pt has Hx of OSA. Pt Stated used CPAP in past. He has been SB while sleeping and NSR when awake

## 2019-05-21 NOTE — Progress Notes (Signed)
SATURATION QUALIFICATIONS: (This note is used to comply with regulatory documentation for home oxygen)  Patient Saturations on Room Air at Rest = 94%  Patient Saturations on Room Air while Ambulating = 84%  Patient Saturations on 1 Liters of oxygen while Ambulating = 95%  Please briefly explain why patient needs home oxygen: patient dropped to low 80s on room air while mobilizing with room air for about 230 feet and no rest breaks. Patient was able to maintain O2 stats above 90% on 1l of portable O2 via nasal cannula.  Tyvon Eggenberger OTR/L

## 2019-05-21 NOTE — Discharge Summary (Signed)
[facility]  Physician Discharge Summary   Patient ID: Steve [name] MRN: [id number] DOB/AGE: January 20, 1956 64 y.o.  Admit date: 05/16/2019 Discharge date: 05/21/2019  PCP: [name], PA-C  DISCHARGE DIAGNOSES:  Pneumonia due to COVID-19 Exertional hypoxia requiring home oxygen Neutropenic fever, resolved Bacteremia likely contaminant Hodgkin's lymphoma undergoing chemotherapy Mellitus type II, uncontrolled with hyperglycemia Essential hypertension Anemia of chronic disease Hyponatremia, resolved  RECOMMENDATIONS FOR OUTPATIENT FOLLOW UP: 1. Home oxygen to be ordered 2. Patient to follow-up with his oncologist    Home Health: None Equipment/Devices: Home oxygen  CODE STATUS: Full code  DISCHARGE CONDITION: fair  Diet recommendation: As before  INITIAL HISTORY: 64 y.o.male, w Hodgkins lymphoma, HTN, DM2, OSA, presented with fever.  Also had alteration in sense of taste and smell.  Patient is getting chemotherapy for Hodgkin's lymphoma.  His last treatment was on January 25.  He also received Neulasta infusion.  Chest x-ray showed subsegmental atelectasis or chronic interstitial thickening.  COVID-19 was positive.  CT chest was positive for multifocal opacities.  Patient was hospitalized due to his immunocompromise status.    Consultations:  Phone consultation with oncology   HOSPITAL COURSE:   Pneumonia due to COVID-19 Patient had elevated inflammatory markers.  CT angiogram showed opacities in the lungs.  No PE was noted.  Patient was started on remdesivir and steroids.  Due to neutropenia and fever he was also started on vancomycin and cefepime.  Patient's respiratory status started improving.  He started feeling better.  He tends to desaturate with exertion so he will need home oxygen.  Inflammatory markers have improved.  Due to his malignancy patient not given Actemra baricitinib or convalescent plasma.  Neutropenic fever/bacteremia Neutropenia  secondary to chemotherapy.  Patient received Neulasta infusion 1/25 along with his chemotherapy. Only coag negative staph identified on blood cultures which is likely a contaminant.  MRSA PCR negative.  Vancomycin was discontinued.  Cefepime to be stopped today.  Neutrophil counts have recovered.  Discussed with his oncologist.  They will arrange follow-up in the outpatient clinic.  Currently has an appointment on 06/03/2019.  Hodgkin's lymphoma, stage III Followed by Dr. Maylon Peppers with oncology.  Diagnosed in November 2020.  Currently on chemotherapy.  Last chemotherapy was on 1/25 when he received doxorubicin, vinblastine, dacarbazine and brentuximab.  He has had 2 cycles so far.  He was supposed to get his third cycle this week however will have to hold off for now.  Diabetes mellitus type 2, uncontrolled with hyperglycemia HbA1c is 8.3.    Continue home medications.  Elevated CBGs due to steroids and should improve as steroid is tapered down.  Essential hypertension Holding losartan due to soft blood pressures.    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  No evidence of overt bleeding.  Chemotherapy associated diarrhea Imodium as needed.  Has not had any diarrhea in the last 24 hours.  Hyponatremia Resolved with hydration  Overall stable.  Okay for discharge home today.    PERTINENT LABS:  The results of significant diagnostics from this hospitalization (including imaging, microbiology, ancillary and laboratory) are listed below for reference.    Microbiology: Recent Results (from the past 240 hour(s))  Urine culture     Status: None   Collection Time: 05/16/19  6:57 PM   Specimen: In/Out Cath Urine  Result Value Ref Range Status   Specimen Description   Final    IN/OUT CATH URINE Performed at [facility], [place]  [address] [place], [place] [address]    Special Requests   Final    NONE Performed at [facility], [address] [place], [place] [address]    Culture   Final    NO GROWTH Performed at [facility], [place] [address], [place], [place] [address]    Report Status 05/17/2019 FINAL  Final  SARS Coronavirus 2 by RT PCR (hospital order, performed in [facility] hospital lab) Nasopharyngeal Nasopharyngeal Swab     Status: Abnormal   Collection Time: 05/16/19  6:57 PM   Specimen: Nasopharyngeal Swab  Result Value Ref Range Status   SARS Coronavirus 2 POSITIVE (A) NEGATIVE Final    Comment: RESULT CALLED TO, READ BACK BY AND VERIFIED WITH: Patsy Baltimore RN @2039  05/16/2019 OLSONM Performed at [facility], [place] [place], [place] [address]   Blood Culture (routine x 2)     Status: Abnormal   Collection Time: 05/16/19  6:58 PM   Specimen: BLOOD  Result Value Ref Range Status   Specimen Description   Final    BLOOD LEFT ANTECUBITAL Performed at [facility], [address] [place], [place] [address]    Special Requests   Final    BOTTLES DRAWN AEROBIC AND ANAEROBIC Blood Culture adequate volume Performed at [facility], [address] [place], [place] [address]    Culture  Setup Time   Final    AEROBIC BOTTLE ONLY GRAM POSITIVE COCCI CRITICAL RESULT CALLED TO, READ BACK BY AND VERIFIED WITH: M BELL PHARMD 05/17/19 1931 JDW    Culture (A)  Final    STAPHYLOCOCCUS SPECIES (COAGULASE NEGATIVE) THE SIGNIFICANCE OF ISOLATING THIS ORGANISM FROM A SINGLE SET OF BLOOD CULTURES WHEN MULTIPLE SETS ARE DRAWN IS UNCERTAIN. PLEASE NOTIFY THE MICROBIOLOGY DEPARTMENT WITHIN ONE WEEK IF SPECIATION AND SENSITIVITIES ARE REQUIRED. Performed at [facility], [place] [address] [place], [place] [address]    Report Status 05/18/2019 FINAL  Final  Blood Culture (routine x 2)     Status: None   Collection Time: 05/16/19  6:58 PM   Specimen: BLOOD  Result Value Ref Range Status   Specimen Description   Final    BLOOD RIGHT ANTECUBITAL  Performed at [facility], [address] [place], [place] [address]    Special Requests   Final    BOTTLES DRAWN AEROBIC AND ANAEROBIC Blood Culture results may not be optimal due to an excessive volume of blood received in culture bottles Performed at [facility], [address] [place], [place] [address]    Culture   Final    NO GROWTH 5 DAYS Performed at [facility], [place] [address], [place], [place] [address]    Report Status 05/21/2019 FINAL  Final  MRSA PCR Screening     Status: None   Collection Time: 05/19/19  9:39 AM   Specimen: Nasopharyngeal  Result Value Ref Range Status   MRSA by PCR NEGATIVE NEGATIVE Final    Comment:        The GeneXpert MRSA Assay (FDA approved for NASAL specimens only), is one component of a comprehensive MRSA colonization surveillance program. It is not intended to diagnose MRSA infection nor to guide or monitor treatment for MRSA infections. Performed at [facility], [place] [address] [place], [place] [address]      Labs:  [place]  05/19/19 0356 05/20/19 0421 05/21/19 0300  DDIMER 0.73* 0.71* 0.68*  FERRITIN 2,244* 1,834*  --   CRP 5.3* 1.8*  --     Lab Results  Component Value Date   SARSCOV2NAA POSITIVE (A) 05/16/2019   Elizabeth NEGATIVE 03/18/2019      Basic Metabolic Panel: Recent Labs  Lab 05/17/19 1701 05/18/19 0429 05/19/19 0356 05/20/19 0421 05/21/19 0300  NA 133* 136 139 139 138  K 3.9 3.5 4.0 3.6 4.3  CL 102 102 107 105 105  CO2 22 20* 25 26 27   GLUCOSE 313* 182* 176* 154* 136*  BUN 15 15 19 21 20   CREATININE 0.81 0.82 0.81 0.67 0.75  CALCIUM 8.3* 8.0* 8.6* 8.6* 8.8*  MG  --   --   --   --  1.6*   Liver Function Tests: Recent Labs  Lab 05/17/19 1701 05/18/19 0429 05/19/19 0356 05/20/19 0421 05/21/19 0300  AST 21 17 20 27 25   ALT 31 25 26  33 34  ALKPHOS 51 45 44 45 50  BILITOT 0.6 0.4 0.5 0.3 0.7  PROT 6.4* 5.7* 5.6*  5.4* 5.8*  ALBUMIN 2.7* 2.5* 2.4* 2.5* 2.6*   CBC: Recent Labs  Lab 05/17/19 1701 05/18/19 0429 05/19/19 0356 05/20/19 0421 05/21/19 0300  WBC 0.5* 1.4* 3.3* 8.7 14.3*  NEUTROABS 0.1* 0.1* 0.5* 2.8 6.3  HGB 9.0* 8.3* 8.7* 8.6* 9.3*  HCT 27.6* 26.0* 26.7* 26.3* 28.7*  MCV 83.9 85.2 83.4 82.7 84.7  PLT 234 277 342 354 362   BNP: BNP (last 3 results) Recent Labs    05/17/19 0517  BNP 67.7    CBG: Recent Labs  Lab 05/20/19 1157 05/20/19 1703 05/20/19 2205 05/21/19 0803 05/21/19 1218  GLUCAP 177* 215* 246* 179* 272*     IMAGING STUDIES DG Chest 2 View  Result Date: 04/25/2019 CLINICAL DATA:  Cough.  History of lymphoma. EXAM: CHEST - 2 VIEW COMPARISON:  PET-CT 04/03/2019. CT chest 02/28/2019. Chest x-ray 05/06/2005. FINDINGS: Surgical clips right upper chest. PowerPort catheter noted with tip over superior vena cava. Significant reduction in size of mediastinal adenopathy. Heart size normal. Low lung volumes with bibasilar atelectasis. Mild infiltrates in the left mid lung and lung bases cannot be excluded. No pleural effusion or pneumothorax. Diffuse osteopenia degenerative change thoracic spine. No acute bony abnormality. IMPRESSION: PowerPort catheter noted with tip over SVC. 2.  Significant reduction in size of mediastinal adenopathy. 3. Low lung volumes with bibasilar atelectasis. Mild infiltrates in the left mid lung and lung bases cannot be excluded. Electronically Signed   By: Marcello Moores  Register   On: 04/25/2019 06:19   CT ANGIO CHEST PE W OR WO CONTRAST  Result Date: 05/17/2019 CLINICAL DATA:  PE suspected. History of Hodgkin's lymphoma EXAM: CT ANGIOGRAPHY CHEST WITH CONTRAST TECHNIQUE: Multidetector CT imaging of the chest was performed using the standard protocol during bolus administration of intravenous contrast. Multiplanar CT image reconstructions and MIPs were obtained to evaluate the vascular anatomy. CONTRAST:  139mL OMNIPAQUE IOHEXOL 350 MG/ML SOLN COMPARISON:   02/28/2019 FINDINGS: Cardiovascular: Heart size upper limits of normal unchanged. No pericardial effusion. Aortic caliber with mild dilation of the ascending thoracic aorta approximately 3.2 cm. Central pulmonary vasculature mildly dilated moderately dilated at 3 cm max caliber. No signs of pulmonary embolism. Mildly limited assessment due to bolus timing and respiratory motion. Left sided Port-A-Cath terminates at the caval to atrial junction. Mediastinum/Nodes: Bulky mediastinal lymphadenopathy is diminished in size. Right paratracheal lymph node 3 x 2.6 cm previously 5.8  x 4.7 cm. Other lymph nodes with similar decrease in size. No signs of axillary adenopathy. Lungs/Pleura: Multiple peripheral and nodular opacities with ground-glass and septal thickening have developed since the study of November of 2020 where there was some mild subpleural reticulation. No signs of dense consolidative change of the lobar distribution. Upper Abdomen: Imaged portions of liver, spleen, adrenal glands and upper portions of the kidneys are unremarkable. Spleen is incompletely imaged as are other upper abdominal viscera. Musculoskeletal: No signs of chest wall lesion. No acute or destructive bone process. Review of the MIP images confirms the above findings. IMPRESSION: 1. No signs of pulmonary embolism. 2. Interval development of multiple peripheral and nodular opacities with ground-glass and septal thickening. Findings of atypical infection compatible with history of COVID-19. 3. Interval decrease in size of bulky mediastinal lymphadenopathy. 4. Mild dilation of the ascending thoracic aorta to 3.2 cm max caliber. Recommend annual imaging followup by CTA or MRA. This recommendation follows 2010 ACCF/AHA/AATS/ACR/ASA/SCA/SCAI/SIR/STS/SVM Guidelines for the Diagnosis and Management of Patients with Thoracic Aortic Disease. Circulation.2010; 121: N027-O536. Aortic aneurysm NOS (ICD10-I71.9) 5. Mild central pulmonary arterial  dilatation suggestive of pulmonary arterial hypertension. 6. Left-sided Port-A-Cath terminates at the caval to atrial junction. Electronically Signed   By: Zetta Bills M.D.   On: 05/17/2019 10:49   DG Chest Port 1 View  Result Date: 05/21/2019 CLINICAL DATA:  COVID-19 pneumonia EXAM: PORTABLE CHEST 1 VIEW COMPARISON:  05/17/2019 FINDINGS: Cardiac shadow is stable. Left chest wall port is again seen. Patchy subpleural densities are noted similar to that seen on prior CT examination consistent with the given clinical history. The overall appearance is stable from the prior exam. No sizable effusion is noted. No bony abnormality is seen. IMPRESSION: Bilateral peripheral opacities consistent with the given clinical history. Electronically Signed   By: Inez Catalina M.D.   On: 05/21/2019 09:25   DG Chest Portable 1 View  Result Date: 05/16/2019 CLINICAL DATA:  Shortness of breath and fever EXAM: PORTABLE CHEST 1 VIEW COMPARISON:  April 24, 2019 FINDINGS: The heart size and mediastinal contours are unchanged. Aortic knob calcifications. A left-sided MediPort catheter seen with the tip at the superior cavoatrial junction. Mildly increased interstitial markings are again noted at both lung bases. Overlying surgical clips are seen. No acute osseous abnormality. IMPRESSION: Subsegmental atelectasis or chronic interstitial thickening at both lung bases. No acute cardiopulmonary process. Electronically Signed   By: Prudencio Pair M.D.   On: 05/16/2019 18:52    DISCHARGE EXAMINATION: Vitals:   05/21/19 0319 05/21/19 0526 05/21/19 0527 05/21/19 0805  BP:   (!) 96/57 98/61  Pulse: (!) 54 (!) 54 73 73  Resp:    19  Temp:   97.8 F (36.6 C) 97.9 F (36.6 C)  TempSrc:   Oral Oral  SpO2: 94% 95% 100% 100%  Weight:      Height:       General appearance: Awake alert.  In no distress Resp: Improved effort.  Few crackles at the bases.  No wheezing or rhonchi Cardio: S1-S2 is normal regular.  No S3-S4.  No rubs  murmurs or bruit GI: Abdomen is soft.  Nontender nondistended.  Bowel sounds are present normal.  No masses organomegaly    DISPOSITION: Home  Discharge Instructions    Call MD for:  difficulty breathing, headache or visual disturbances   Complete by: As directed    Call MD for:  extreme fatigue   Complete by: As directed    Call MD for:  persistant dizziness or light-headedness   Complete by: As directed    Call MD for:  persistant nausea and vomiting   Complete by: As directed    Call MD for:  severe uncontrolled pain   Complete by: As directed    Call MD for:  temperature >100.4   Complete by: As directed    Diet Carb Modified   Complete by: As directed    Discharge instructions   Complete by: As directed    Please call your oncologist office to confirm follow-up.  Please take your medications as prescribed.  COVID 19 INSTRUCTIONS  - You are felt to be stable enough to no longer require inpatient monitoring, testing, and treatment, though you will need to follow the recommendations below: - Based on the CDC's non-test criteria for ending self-isolation: You may not return to work/leave the home until at least 21 days since symptom onset AND 3 days without a fever (without taking tylenol, ibuprofen, etc.) AND have improvement in respiratory symptoms. - Do not take NSAID medications (including, but not limited to, ibuprofen, advil, motrin, naproxen, aleve, goody's powder, etc.) - Follow up with your doctor in the next week via telehealth or seek medical attention right away if your symptoms get WORSE.  - Consider donating plasma after you have recovered (either 14 days after a negative test or 28 days after symptoms have completely resolved) because your antibodies to this virus may be helpful to give to others with life-threatening infections. Please go to the website www.[email or web address] if you would like to consider volunteering for plasma donation.    Directions for you at home:   Wear a facemask You should wear a facemask that covers your nose and mouth when you are in the same room with other people and when you visit a healthcare provider. People who live with or visit you should also wear a facemask while they are in the same room with you.  Separate yourself from other people in your home As much as possible, you should stay in a different room from other people in your home. Also, you should use a separate bathroom, if available.  Avoid sharing household items You should not share dishes, drinking glasses, cups, eating utensils, towels, bedding, or other items with other people in your home. After using these items, you should wash them thoroughly with soap and water.  Cover your coughs and sneezes Cover your mouth and nose with a tissue when you cough or sneeze, or you can cough or sneeze into your sleeve. Throw used tissues in a lined trash can, and immediately wash your hands with soap and water for at least 20 seconds or use an alcohol-based hand rub.  Wash your [facility] your hands often and thoroughly with soap and water for at least 20 seconds. You can use an alcohol-based hand sanitizer if soap and water are not available and if your hands are not visibly dirty. Avoid touching your eyes, nose, and mouth with unwashed hands.  Directions for those who live with, or provide care at home for you:  Limit the number of people who have contact with the patient If possible, have only one caregiver for the patient. Other household members should stay in another home or place of residence. If this is not possible, they should stay in another room, or be separated from the patient as much as possible. Use a separate bathroom, if available. Restrict visitors who do not have an essential need to be in  the home.  Ensure good ventilation Make sure that shared spaces in the home have good air flow, such as from an air conditioner or an opened window, weather  permitting.  Wash your hands often Wash your hands often and thoroughly with soap and water for at least 20 seconds. You can use an alcohol based hand sanitizer if soap and water are not available and if your hands are not visibly dirty. Avoid touching your eyes, nose, and mouth with unwashed hands. Use disposable paper towels to dry your hands. If not available, use dedicated cloth towels and replace them when they become wet.  Wear a facemask and gloves Wear a disposable facemask at all times in the room and gloves when you touch or have contact with the patient's blood, body fluids, and/or secretions or excretions, such as sweat, saliva, sputum, nasal mucus, vomit, urine, or feces.  Ensure the mask fits over your nose and mouth tightly, and do not touch it during use. Throw out disposable facemasks and gloves after using them. Do not reuse. Wash your hands immediately after removing your facemask and gloves. If your personal clothing becomes contaminated, carefully remove clothing and launder. Wash your hands after handling contaminated clothing. Place all used disposable facemasks, gloves, and other waste in a lined container before disposing them with other household waste. Remove gloves and wash your hands immediately after handling these items.  Do not share dishes, glasses, or other household items with the patient Avoid sharing household items. You should not share dishes, drinking glasses, cups, eating utensils, towels, bedding, or other items with a patient who is confirmed to have, or being evaluated for, COVID-19 infection. After the person uses these items, you should wash them thoroughly with soap and water.  Wash laundry thoroughly Immediately remove and wash clothes or bedding that have blood, body fluids, and/or secretions or excretions, such as sweat, saliva, sputum, nasal mucus, vomit, urine, or feces, on them. Wear gloves when handling laundry from the patient. Read and  follow directions on labels of laundry or clothing items and detergent. In general, wash and dry with the warmest temperatures recommended on the label.  Clean all areas the individual has used often Clean all touchable surfaces, such as counters, tabletops, doorknobs, bathroom fixtures, toilets, phones, keyboards, tablets, and bedside tables, every day. Also, clean any surfaces that may have blood, body fluids, and/or secretions or excretions on them. Wear gloves when cleaning surfaces the patient has come in contact with. Use a diluted bleach solution (e.g., dilute bleach with 1 part bleach and 10 parts water) or a household disinfectant with a label that says EPA-registered for coronaviruses. To make a bleach solution at home, add 1 tablespoon of bleach to 1 quart (4 cups) of water. For a larger supply, add  cup of bleach to 1 gallon (16 cups) of water. Read labels of cleaning products and follow recommendations provided on product labels. Labels contain instructions for safe and effective use of the cleaning product including precautions you should take when applying the product, such as wearing gloves or eye protection and making sure you have good ventilation during use of the product. Remove gloves and wash hands immediately after cleaning.  Monitor yourself for signs and symptoms of illness Caregivers and household members are considered close contacts, should monitor their health, and will be asked to limit movement outside of the home to the extent possible. Follow the monitoring steps for close contacts listed on the symptom monitoring form.  If you have additional questions, contact your local health department or call the epidemiologist on call at [phone] (available 24/7). This guidance is subject to change. For the most up-to-date guidance from [facility], please refer to their website: YouBlogs.pl   You were cared for  by a hospitalist during your hospital stay. If you have any questions about your discharge medications or the care you received while you were in the hospital after you are discharged, you can call the unit and asked to speak with the hospitalist on call if the hospitalist that took care of you is not available. Once you are discharged, your primary care physician will handle any further medical issues. Please note that NO REFILLS for any discharge medications will be authorized once you are discharged, as it is imperative that you return to your primary care physician (or establish a relationship with a primary care physician if you do not have one) for your aftercare needs so that they can reassess your need for medications and monitor your lab values. If you do not have a primary care physician, you can call [phone] for a physician referral.   Increase activity slowly   Complete by: As directed    MyChart COVID-19 home monitoring program   Complete by: May 21, 2019    Is the patient willing to use the [facility] for home monitoring?: Yes   Temperature monitoring   Complete by: May 21, 2019    After how many days would you like to receive a notification of this patient's flowsheet entries?: 1         Allergies as of 05/21/2019   No Known Allergies     Medication List    STOP taking these medications   HYDROcodone-acetaminophen 5-325 MG tablet Commonly known as: Norco   losartan 100 MG tablet Commonly known as: COZAAR     TAKE these medications   atorvastatin 10 MG tablet Commonly known as: LIPITOR TAKE 1 TABLET BY MOUTH EVERY DAY   dexamethasone 4 MG tablet Commonly known as: DECADRON Take 2 tablets by mouth once a day for 3 days after chemo. Take with food. What changed:   how much to take  how to take this  when to take this   dexamethasone 2 MG tablet Commonly known as: DECADRON Take 2 tablets once daily for 3 days, then 1 tablet once daily for 3 days, then  STOP. What changed: You were already taking a medication with the same name, and this prescription was added. Make sure you understand how and when to take each.   ferrous sulfate 325 (65 FE) MG tablet Take 1 tablet (325 mg total) by mouth daily with breakfast. Start taking on: May 22, 2019   guaiFENesin-dextromethorphan 100-10 MG/5ML syrup Commonly known as: ROBITUSSIN DM Take 5 mLs by mouth every 4 (four) hours as needed for cough.   lidocaine-prilocaine cream Commonly known as: EMLA Apply to affected area once What changed:   how much to take  how to take this  when to take this  reasons to take this  additional instructions   LORazepam 0.5 MG tablet Commonly known as: ATIVAN TAKE 1 TABLET (0.5 MG TOTAL) BY MOUTH EVERY 6 (SIX) HOURS AS NEEDED (NAUSEA OR VOMITING).   Magnesium Oxide 400 MG Caps Take 1 capsule (400 mg total) by mouth 2 (two) times daily for 5 days.   metFORMIN 500 MG tablet Commonly known as: GLUCOPHAGE TAKE 1 TABLET BY MOUTH TWICE A DAY WITH MEALS  ondansetron 8 MG tablet Commonly known as: Zofran Take 1 tablet (8 mg total) by mouth 2 (two) times daily as needed. Start on the third day after chemotherapy. What changed: reasons to take this   prochlorperazine 10 MG tablet Commonly known as: COMPAZINE Take 1 tablet (10 mg total) by mouth every 6 (six) hours as needed (Nausea or vomiting).   sildenafil 100 MG tablet Commonly known as: Viagra Take 0.5-1 tablets (50-100 mg total) by mouth daily as needed for erectile dysfunction.            Durable Medical Equipment  (From admission, onward)         Start     Ordered   05/21/19 1312  For home use only DME oxygen  Once    Question Answer Comment  Length of Need 6 Months   Mode or (Route) Nasal cannula   Liters per Minute 2   Frequency Continuous (stationary and portable oxygen unit needed)   Oxygen conserving device Yes   Oxygen delivery system Gas      05/21/19 1311   05/21/19  1026  DME 3-in-1  Once     05/21/19 1026           Follow-up Information    Saguier, Percell Miller, PA-C. Schedule an appointment as soon as possible for a visit in 2 week(s).   Specialties: Internal Medicine, Family Medicine Contact information: [address] [place] [address] [phone]        Tish Men, MD Follow up.   Specialties: Hematology, Oncology Why: Appointment on 2/22.  Please call the office to confirm. Contact information: [place] [address] [phone]           TOTAL DISCHARGE TIME: 35 minutes  [facility] Pager on [email or web address]  05/21/2019, 1:19 PM

## 2019-05-21 NOTE — Progress Notes (Signed)
Occupational Therapy Treatment/Discharge Patient Details Name: Steve [name] MRN: [id number] DOB: 10-26-1955 Today's Date: 05/21/2019    History of present illness 64 y.o. male, w Hodgkins lymphoma, HTN, DM2, OSA, presented with fever.  Also had alteration in sense of taste and smell.  Patient is getting chemotherapy for Hodgkin's lymphoma.  His last treatment was on January 25.  He also received Neulasta infusion.  Chest x-ray showed subsegmental atelectasis or chronic interstitial thickening.  COVID-19 was positive.  CT chest was positive for multifocal opacities.  Patient was hospitalized due to his immunocompromise status.     OT comments  Patient has met all OT goals performing at Modified Independence level with extra time. Patient requires only set-up for bathing task, but able to physically complete all parts of bathing with Independence. Patient was given a handout and demo of B UE AROM there exercises. Patient's O2 stats dropped to 87% during B UE therapeutic exercises for AROM in all planes. Patient was able to provide return demo of PLB techniques with recovery time of 10 seconds to O2 stats in 90s. Patient will d/c to sister's home and recommend no further OT goals. Patient will benefit from at least 1L of O2 during long distance mobility.          Follow Up Recommendations  No OT follow up    Equipment Recommendations  3 in 1 bedside commode    Recommendations for Other Services      Precautions / Restrictions Precautions Precautions: Fall Restrictions Weight Bearing Restrictions: No       Mobility Bed Mobility                  Transfers Overall transfer level: Independent                    Balance                                           ADL either performed or assessed with clinical judgement   ADL   Eating/Feeding: Independent   Grooming: Wash/dry hands;Applying deodorant;Independent   Upper Body Bathing: Set up    Lower Body Bathing: Set up   Upper Body Dressing : Independent   Lower Body Dressing: Independent   Toilet Transfer: Independent   Toileting- Clothing Manipulation and Hygiene: Independent       Functional mobility during ADLs: Independent       Vision       Perception     Praxis      Cognition Arousal/Alertness: Awake/alert                                              Exercises Exercises: General Upper Extremity;Other exercises General Exercises - Upper Extremity Shoulder Flexion: AROM Shoulder Extension: AROM Shoulder ABduction: AROM Shoulder ADduction: AROM Shoulder Horizontal ABduction: AROM Elbow Flexion: AROM Elbow Extension: AROM Other Exercises Other Exercises: incentive spirometer averge of 2066m for 10 times  Other Exercises: flutter valve 10 reps for hold time of 7 seconds   Shoulder Instructions       General Comments      Pertinent Vitals/ Pain       Pain Assessment: No/denies pain  Home Living  Prior Functioning/Environment              Frequency           Progress Toward Goals  OT Goals(current goals can now be found in the care plan section)  Progress towards OT goals: Goals met/education completed, patient discharged from [facility] "6 Clicks" Daily Activity     Outcome Measure   Help from another person eating meals?: None Help from another person taking care of personal grooming?: None Help from another person toileting, which includes using toliet, bedpan, or urinal?: None Help from another person bathing (including washing, rinsing, drying)?: A Little Help from another person to put on and taking off regular upper body clothing?: None Help from another person to put on and taking off regular lower body clothing?: None 6 Click Score: 23    End of Session Equipment Utilized  During Treatment: Oxygen      Activity Tolerance Patient tolerated treatment well   Patient Left in chair;with call bell/phone within reach   Nurse Communication Mobility status(O2 stats during mobility )        Time: 1130-1240 OT Time Calculation (min): 70 min  Charges: OT General Charges $OT Visit: 1 Visit OT Treatments $Self Care/Home Management : 23-37 mins $Therapeutic Activity: 8-22 mins $Therapeutic Exercise: 23-37 mins  Steve [name] OTR/L    Steve [name] 05/21/2019, 1:41 PM

## 2019-05-22 DIAGNOSIS — R5081 Fever presenting with conditions classified elsewhere: Secondary | ICD-10-CM

## 2019-05-22 DIAGNOSIS — D709 Neutropenia, unspecified: Secondary | ICD-10-CM

## 2019-05-22 LAB — D-DIMER, QUANTITATIVE: D-Dimer, Quant: 0.93 ug/mL-FEU — ABNORMAL HIGH (ref 0.00–0.50)

## 2019-05-22 LAB — COMPREHENSIVE METABOLIC PANEL
ALT: 35 U/L (ref 0–44)
AST: 25 U/L (ref 15–41)
Albumin: 2.7 g/dL — ABNORMAL LOW (ref 3.5–5.0)
Alkaline Phosphatase: 52 U/L (ref 38–126)
Anion gap: 9 (ref 5–15)
BUN: 21 mg/dL (ref 8–23)
CO2: 27 mmol/L (ref 22–32)
Calcium: 8.7 mg/dL — ABNORMAL LOW (ref 8.9–10.3)
Chloride: 103 mmol/L (ref 98–111)
Creatinine, Ser: 0.75 mg/dL (ref 0.61–1.24)
GFR calc Af Amer: >60 mL/min (ref >60)
GFR calc non Af Amer: >60 mL/min (ref >60)
Glucose, Bld: 141 mg/dL — ABNORMAL HIGH (ref 70–99)
Potassium: 4.2 mmol/L (ref 3.5–5.1)
Sodium: 139 mmol/L (ref 135–145)
Total Bilirubin: 0.3 mg/dL (ref 0.3–1.2)
Total Protein: 5.9 g/dL — ABNORMAL LOW (ref 6.5–8.1)

## 2019-05-22 LAB — CBC WITH DIFFERENTIAL/PLATELET
Abs Immature Granulocytes: 3.9 10*3/uL — ABNORMAL HIGH (ref 0.00–0.07)
Band Neutrophils: 12 %
Basophils Absolute: 0 10*3/uL (ref 0.0–0.1)
Basophils Relative: 0 %
Eosinophils Absolute: 0 10*3/uL (ref 0.0–0.5)
Eosinophils Relative: 0 %
HCT: 29.6 % — ABNORMAL LOW (ref 39.0–52.0)
Hemoglobin: 9.5 g/dL — ABNORMAL LOW (ref 13.0–17.0)
Lymphocytes Relative: 14 %
Lymphs Abs: 3 10*3/uL (ref 0.7–4.0)
MCH: 27.1 pg (ref 26.0–34.0)
MCHC: 32.1 g/dL (ref 30.0–36.0)
MCV: 84.6 fL (ref 80.0–100.0)
Metamyelocytes Relative: 3 %
Monocytes Absolute: 2.2 10*3/uL — ABNORMAL HIGH (ref 0.1–1.0)
Monocytes Relative: 10 %
Myelocytes: 15 %
Neutro Abs: 12.5 10*3/uL — ABNORMAL HIGH (ref 1.7–7.7)
Neutrophils Relative %: 46 %
Platelets: 389 10*3/uL (ref 150–400)
RBC: 3.5 MIL/uL — ABNORMAL LOW (ref 4.22–5.81)
RDW: 15.7 % — ABNORMAL HIGH (ref 11.5–15.5)
WBC: 21.6 10*3/uL — ABNORMAL HIGH (ref 4.0–10.5)
nRBC: 1.3 % — ABNORMAL HIGH (ref 0.0–0.2)

## 2019-05-22 LAB — GLUCOSE, CAPILLARY: Glucose-Capillary: 99 mg/dL (ref 70–99)

## 2019-05-22 NOTE — Progress Notes (Signed)
PT Cancellation Note  Patient Details Name: Steve [name] MRN: [id number] DOB: 1955-11-25   Cancelled Treatment:    Reason Eval/Treat Not Completed: Other (comment)(Declined, leaving today, gave handouts for education) Pt declined need for further PT, stated had everything needed and verbalized understanding to looking over handouts for LE strengthening and energy conservation for safety post discharge.  Per MD, pt no longer requires PT and is discharged from services.   Othniel Maret A Indiyah Paone 05/22/2019, 9:48 AM

## 2019-05-22 NOTE — Progress Notes (Signed)
Patient seen examined and reviewed  Feels good today--oxygen delivered and he will be staying at sisters place who will quarantine him  He is stable for d/c at this time   Verneita Griffes, MD [facility] 9:41 AM

## 2019-06-03 ENCOUNTER — Encounter: Payer: Self-pay | Admitting: Hematology

## 2019-06-03 ENCOUNTER — Inpatient Hospital Stay: Payer: BC Managed Care – PPO | Attending: Hematology

## 2019-06-03 ENCOUNTER — Inpatient Hospital Stay: Payer: BC Managed Care – PPO

## 2019-06-03 ENCOUNTER — Inpatient Hospital Stay (HOSPITAL_BASED_OUTPATIENT_CLINIC_OR_DEPARTMENT_OTHER): Payer: BC Managed Care – PPO | Admitting: Hematology

## 2019-06-03 ENCOUNTER — Other Ambulatory Visit: Payer: Self-pay

## 2019-06-03 VITALS — BP 119/59 | HR 71 | Temp 98.0°F | Resp 20 | Ht 66.0 in | Wt 181.1 lb

## 2019-06-03 DIAGNOSIS — C8118 Nodular sclerosis classical Hodgkin lymphoma, lymph nodes of multiple sites: Secondary | ICD-10-CM

## 2019-06-03 DIAGNOSIS — Z7952 Long term (current) use of systemic steroids: Secondary | ICD-10-CM | POA: Insufficient documentation

## 2019-06-03 DIAGNOSIS — G620 Drug-induced polyneuropathy: Secondary | ICD-10-CM | POA: Insufficient documentation

## 2019-06-03 DIAGNOSIS — D6481 Anemia due to antineoplastic chemotherapy: Secondary | ICD-10-CM | POA: Diagnosis not present

## 2019-06-03 DIAGNOSIS — Z5111 Encounter for antineoplastic chemotherapy: Secondary | ICD-10-CM | POA: Insufficient documentation

## 2019-06-03 DIAGNOSIS — T451X5A Adverse effect of antineoplastic and immunosuppressive drugs, initial encounter: Secondary | ICD-10-CM

## 2019-06-03 DIAGNOSIS — Z7984 Long term (current) use of oral hypoglycemic drugs: Secondary | ICD-10-CM | POA: Diagnosis not present

## 2019-06-03 DIAGNOSIS — Z79899 Other long term (current) drug therapy: Secondary | ICD-10-CM | POA: Diagnosis not present

## 2019-06-03 DIAGNOSIS — Z7689 Persons encountering health services in other specified circumstances: Secondary | ICD-10-CM | POA: Insufficient documentation

## 2019-06-03 LAB — CMP (CANCER CENTER ONLY)
ALT: 17 U/L (ref 0–44)
AST: 12 U/L — ABNORMAL LOW (ref 15–41)
Albumin: 3.6 g/dL (ref 3.5–5.0)
Alkaline Phosphatase: 52 U/L (ref 38–126)
Anion gap: 9 (ref 5–15)
BUN: 20 mg/dL (ref 8–23)
CO2: 26 mmol/L (ref 22–32)
Calcium: 9.1 mg/dL (ref 8.9–10.3)
Chloride: 104 mmol/L (ref 98–111)
Creatinine: 0.97 mg/dL (ref 0.61–1.24)
GFR, Est AFR Am: >60 mL/min (ref >60)
GFR, Estimated: >60 mL/min (ref >60)
Glucose, Bld: 192 mg/dL — ABNORMAL HIGH (ref 70–99)
Potassium: 3.8 mmol/L (ref 3.5–5.1)
Sodium: 139 mmol/L (ref 135–145)
Total Bilirubin: 0.6 mg/dL (ref 0.3–1.2)
Total Protein: 6.5 g/dL (ref 6.5–8.1)

## 2019-06-03 LAB — CBC WITH DIFFERENTIAL (CANCER CENTER ONLY)
Abs Immature Granulocytes: 0.19 10*3/uL — ABNORMAL HIGH (ref 0.00–0.07)
Basophils Absolute: 0.1 10*3/uL (ref 0.0–0.1)
Basophils Relative: 1 %
Eosinophils Absolute: 0 10*3/uL (ref 0.0–0.5)
Eosinophils Relative: 0 %
HCT: 32.6 % — ABNORMAL LOW (ref 39.0–52.0)
Hemoglobin: 10.4 g/dL — ABNORMAL LOW (ref 13.0–17.0)
Immature Granulocytes: 2 %
Lymphocytes Relative: 16 %
Lymphs Abs: 2 10*3/uL (ref 0.7–4.0)
MCH: 28.4 pg (ref 26.0–34.0)
MCHC: 31.9 g/dL (ref 30.0–36.0)
MCV: 89.1 fL (ref 80.0–100.0)
Monocytes Absolute: 1.5 10*3/uL — ABNORMAL HIGH (ref 0.1–1.0)
Monocytes Relative: 12 %
Neutro Abs: 8.7 10*3/uL — ABNORMAL HIGH (ref 1.7–7.7)
Neutrophils Relative %: 69 %
Platelet Count: 272 10*3/uL (ref 150–400)
RBC: 3.66 MIL/uL — ABNORMAL LOW (ref 4.22–5.81)
RDW: 20.6 % — ABNORMAL HIGH (ref 11.5–15.5)
WBC Count: 12.5 10*3/uL — ABNORMAL HIGH (ref 4.0–10.5)
nRBC: 0 % (ref 0.0–0.2)

## 2019-06-03 MED ORDER — DIPHENHYDRAMINE HCL 50 MG/ML IJ SOLN
INTRAMUSCULAR | Status: AC
  Filled 2019-06-03: qty 1

## 2019-06-03 MED ORDER — HEPARIN SOD (PORK) LOCK FLUSH 100 UNIT/ML IV SOLN
500.0000 [IU] | Freq: Once | INTRAVENOUS | Status: AC | PRN
  Administered 2019-06-03: 500 [IU]
  Filled 2019-06-03: qty 5

## 2019-06-03 MED ORDER — PEGFILGRASTIM 6 MG/0.6ML ~~LOC~~ PSKT
6.0000 mg | PREFILLED_SYRINGE | Freq: Once | SUBCUTANEOUS | Status: AC
  Administered 2019-06-03: 6 mg via SUBCUTANEOUS

## 2019-06-03 MED ORDER — SODIUM CHLORIDE 0.9 % IV SOLN
100.0000 mg | Freq: Once | INTRAVENOUS | Status: AC
  Administered 2019-06-03: 100 mg via INTRAVENOUS
  Filled 2019-06-03: qty 20

## 2019-06-03 MED ORDER — PEGFILGRASTIM 6 MG/0.6ML ~~LOC~~ PSKT
PREFILLED_SYRINGE | SUBCUTANEOUS | Status: AC
  Filled 2019-06-03: qty 0.6

## 2019-06-03 MED ORDER — PALONOSETRON HCL INJECTION 0.25 MG/5ML
0.2500 mg | Freq: Once | INTRAVENOUS | Status: AC
  Administered 2019-06-03: 0.25 mg via INTRAVENOUS

## 2019-06-03 MED ORDER — SODIUM CHLORIDE 0.9% FLUSH
10.0000 mL | INTRAVENOUS | Status: DC | PRN
  Administered 2019-06-03: 10 mL
  Filled 2019-06-03: qty 10

## 2019-06-03 MED ORDER — SODIUM CHLORIDE 0.9 % IV SOLN
375.0000 mg/m2 | Freq: Once | INTRAVENOUS | Status: AC
  Administered 2019-06-03: 750 mg via INTRAVENOUS
  Filled 2019-06-03: qty 75

## 2019-06-03 MED ORDER — PALONOSETRON HCL INJECTION 0.25 MG/5ML
INTRAVENOUS | Status: AC
  Filled 2019-06-03: qty 5

## 2019-06-03 MED ORDER — DOXORUBICIN HCL CHEMO IV INJECTION 2 MG/ML
25.0000 mg/m2 | Freq: Once | INTRAVENOUS | Status: AC
  Administered 2019-06-03: 50 mg via INTRAVENOUS
  Filled 2019-06-03: qty 25

## 2019-06-03 MED ORDER — ACETAMINOPHEN 325 MG PO TABS
ORAL_TABLET | ORAL | Status: AC
  Filled 2019-06-03: qty 2

## 2019-06-03 MED ORDER — ACETAMINOPHEN 325 MG PO TABS
650.0000 mg | ORAL_TABLET | Freq: Once | ORAL | Status: AC
  Administered 2019-06-03: 650 mg via ORAL

## 2019-06-03 MED ORDER — SODIUM CHLORIDE 0.9 % IV SOLN
150.0000 mg | Freq: Once | INTRAVENOUS | Status: AC
  Administered 2019-06-03: 150 mg via INTRAVENOUS
  Filled 2019-06-03: qty 150

## 2019-06-03 MED ORDER — DEXAMETHASONE SODIUM PHOSPHATE 10 MG/ML IJ SOLN
10.0000 mg | Freq: Once | INTRAMUSCULAR | Status: AC
  Administered 2019-06-03: 10 mg via INTRAVENOUS

## 2019-06-03 MED ORDER — SODIUM CHLORIDE 0.9 % IV SOLN
Freq: Once | INTRAVENOUS | Status: AC
  Filled 2019-06-03: qty 250

## 2019-06-03 MED ORDER — VINBLASTINE SULFATE CHEMO INJECTION 1 MG/ML
6.0000 mg/m2 | Freq: Once | INTRAVENOUS | Status: AC
  Administered 2019-06-03: 12.1 mg via INTRAVENOUS
  Filled 2019-06-03: qty 12.1

## 2019-06-03 MED ORDER — DEXAMETHASONE SODIUM PHOSPHATE 10 MG/ML IJ SOLN
INTRAMUSCULAR | Status: AC
  Filled 2019-06-03: qty 1

## 2019-06-03 MED ORDER — DIPHENHYDRAMINE HCL 50 MG/ML IJ SOLN
50.0000 mg | Freq: Once | INTRAMUSCULAR | Status: AC
  Administered 2019-06-03: 50 mg via INTRAVENOUS

## 2019-06-03 MED ORDER — PEGFILGRASTIM 6 MG/0.6ML ~~LOC~~ PSKT: 6.0000 mg | PREFILLED_SYRINGE | Freq: Once | SUBCUTANEOUS | Status: DC

## 2019-06-03 NOTE — Progress Notes (Signed)
[name] OFFICE PROGRESS NOTE  Patient Care Team: Saguier, [name] as PCP - General (Internal Medicine) Tish Men, MD as Medical Oncologist (Oncology) Cordelia Poche, RN as Oncology Nurse Navigator  HEME/ONC OVERVIEW: 1. Classic Hodgkin lymphoma, nodular sclerosis subtype; at least Stage III -02/2019: enlarged R supraclavicular LN's (~4 and 3cm) on neck US   Enlarged cervical, bulky R supraclavicular, mediastinal, and RP adenopathy  Core R cervical LN bx non-diagnostic   Excisional LN bx showed classic Hodgkin lymphoma, nodular sclerosis subtype   FDG-avid LN's in neck, chest and abdomen; no bony abnormalities  -Late 03/2019 - present: AVD + Adcetris  TREATMENT SUMMARY:  04/08/2019 - present: AVD + Adcetris with Onpro   ASSESSMENT & PLAN:    Stage III Classic Hodgkin lymphoma, nodular sclerosis subtype -S/p C2C1 of AVD + Adcetris -Patient tolerated the treatment relatively well, but his treatment was interrupted due to Covid infection and neutropenic fever in early 05/2019 -Labs reviewed and adequate today, proceed with C2D15 of chemotherapy -I have ordered PET after 2 cycles of treatment to assess interim disease response -I counseled the patient on the importance of monitoring for fever, as well as contacting the clinic if his Onpro kit falls off/malfunctions, so that we can set up Neulasta injection in clinic to reduce the risk of recurrent neutropenic fever  -PRN anti-emetics: Zofran, Compazine, Ativan, and the dexamethasone  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 NM PET Image Restag (PS) Skull Base To Thigh    Standing Status:   Future    Standing Expiration Date:   06/02/2020    Scheduling Instructions:     Pls schedule prior to 06/17/2019    Order Specific Question:   ** REASON FOR EXAM (FREE TEXT)    Answer:   S/p 2 cycles of chemotherapy, assess disease response    Order Specific Question:   If indicated for the ordered procedure, I authorize the administration of a radiopharmaceutical per Radiology protocol    Answer:   Yes    Order Specific Question:   Preferred imaging location?    Answer:   [facility]    Order Specific Question:   Radiology Contrast Protocol - do NOT remove file path    Answer:   [email or web address]   All questions were answered. The patient knows to call the clinic with any problems, questions or concerns. No barriers to learning was detected.  Return in 2 weeks for C3D1 of chemotherapy, PET results and clinic appt.   Tish Men, MD 2/22/202110:06 AM  CHIEF COMPLAINT: "I am much better"  INTERVAL HISTORY: Steve [name] returns to clinic for follow-up of Stage III Hodgkin lymphoma on chemotherapy.  Patient was diagnosed with neutropenic fever and Covid infection early 05/2019, for which he was hospitalized for several days in the IV antibiotics.  Upon further questioning, his Onpro kit apparently had fallen off during standing after his C2D1 of chemotherapy, so he was unsure how much G-CSF he received.  However, he did not contact the clinic to make [place] aware of the issue.  Over the past 2 weeks,  his shortness of breath has resolved, and he has occasional dry cough.  His appetite is back to normal.  He has mild intermittent tingling in the bilateral feet (1x/day), lasting 1 to 2 minutes, and resolves with activity/exercise.  He denies any persistent numbness, tingling, burning sensation, or gait abnormality.  He denies any other complaint  today.  REVIEW OF SYSTEMS:   Constitutional: ( - ) fevers, ( - )  chills , ( - ) night sweats Eyes: ( - ) blurriness of vision, ( - ) double vision, ( - ) watery eyes Ears, nose, mouth, throat, and face: ( - ) mucositis, ( - ) sore throat Respiratory: ( + ) cough, ( - ) dyspnea, ( - ) wheezes Cardiovascular: ( - ) palpitation, ( - ) chest discomfort, ( - ) lower extremity swelling Gastrointestinal:  ( - ) nausea, ( - ) heartburn, ( - ) change in bowel habits Skin: ( - ) abnormal skin rashes Lymphatics: ( - ) new lymphadenopathy, ( - ) easy bruising Neurological: ( - ) numbness, ( + ) tingling, ( - ) new weaknesses Behavioral/Psych: ( - ) mood change, ( - ) new changes  All other systems were reviewed with the patient and are negative.  SUMMARY OF ONCOLOGIC HISTORY: Oncology History  Hodgkin lymphoma ([place])  02/25/2019 Initial Diagnosis   Hodgkin lymphoma ([place])   02/28/2019 Imaging   CT neck: IMPRESSION: Right paratracheal enlarged lymph node and right supraclavicular enlarged lymph nodes compatible with neoplasm. No pharyngeal mass in the neck. No other adenopathy in the neck. Tissue sampling is recommended.   02/28/2019 Imaging   CT CAP: IMPRESSION: 1. Bulky right supraclavicular and mediastinal adenopathy most compatible with metastatic disease. Additionally there is porta hepatic and retroperitoneal adenopathy within the abdomen concerning for metastatic disease. 2. Mild fibrotic changes involving the lungs bilaterally. 3. See dedicated neck CT report.   03/08/2019 Pathology Results   SURGICAL PATHOLOGY  CASE: [id number]  [name]  Surgical Pathology Report      Clinical History: No known primary, now with right supraclavicular  lymphadenopathy, post US guided Bx. (cm)      FINAL MICROSCOPIC DIAGNOSIS:   A. LYMPH NODE, RIGHT SUPRACLAVICULAR, BIOPSY:  - Atypical lymphoid proliferation  - See comment   COMMENT:   The sections show  needle core biopsy fragments of lymph nodal tissue  displaying predominance of small round to slightly irregular lymphocytes  admixed with a minor population of scattered large atypical mononuclear  and multilobated lymphoid appearing cells with variably prominent  nucleoli.  This is admixed with scattering of eosinophils in some areas.  Flow cytometric analysis was attempted but there was insufficient  material present in the sample ([place]).  Immunohistochemical  stains were performed including CD10, CD15, CD20, CD30, CD5, LCA, cyclin  D1, PAX 5, CD3 and EBV with appropriate controls.  LCA is diffusely  positive.  The small lymphoid cells show a mixture of T and B cells with  predominance of T cells.  No significant EBV, CD10 or cyclin D1  positivity is identified.  The larger atypical lymphoid cells appear to  be positive for CD30 and some for CD15 and PAX 5.  The overall findings  are very limited but atypical and worrisome for a lymphoproliferative  process particularly Hodgkin lymphoma.  Excisional biopsy is recommended  including fresh tissue for lymphoma work-up in order to further evaluate  this process    03/20/2019 Pathology Results   FINAL MICROSCOPIC DIAGNOSIS:  A. LYMPH NODE, DEEP RIGHT CERVICAL POSTERIOR TRIANGLE, EXCISION:  -Classical Hodgkin lymphoma  -See comment   COMMENT:   Sections of the lymph node show effacement of the architecture by a  vaguely nodular lymphoproliferative process characterized by a  polymorphous cellular proliferation of small lymphocytes, eosinophils,  plasma cells in addition to variable numbers of large atypical  mononuclear and multi-lobated lymphoid appearing cells with variably  prominent nucleoli characteristic of Reed-Sternberg cells and variants  including lacunar cells.  Areas of early fibrosis surrounding some of  the nodules are also present.  Flow cytometric analysis was performed  ([id number]) and shows predominance of T  lymphocytes with nonspecific  changes in addition to a minor polyclonal B-cell population with no  abnormal phenotype. Immunohistochemical stains were performed including  CD20, CD3, PAX 5, CD15, CD30, LCA and in situ hybridization for EBV with  appropriate controls.  The large atypical lymphoid appearing cells are  positive for CD15, CD30 and weakly for PAX 5 and negative for CD3, CD20,  LCA and EBV.  The small lymphocytes in the background show a mixture of  T and B-cells with predominance of T-cells. The morphologic and  immunophenotypic features are consistent with classical Hodgkin lymphoma  which is best subclassified as nodular sclerosis type.    04/03/2019 Imaging   PET: IMPRESSION: 1. Hypermetabolic lymphadenopathy involving the neck, chest and abdomen consistent with known Hodgkin's lymphoma. [place] 5. 2. No findings for osseous metastatic disease.   04/03/2019 Cancer Staging   Staging form: Hodgkin and Non-Hodgkin Lymphoma, AJCC 8th Edition - Clinical stage from 04/03/2019: Stage III (Hodgkin lymphoma) - Signed by Tish Men, MD on 04/08/2019   04/08/2019 -  Chemotherapy   The patient had DOXOrubicin (ADRIAMYCIN) chemo injection 50 mg, 25 mg/m2 = 50 mg, Intravenous,  Once, 2 of 6 cycles Administration: 50 mg (04/08/2019), 50 mg (04/22/2019), 50 mg (05/06/2019) palonosetron (ALOXI) injection 0.25 mg, 0.25 mg, Intravenous,  Once, 2 of 6 cycles Administration: 0.25 mg (04/08/2019), 0.25 mg (04/22/2019), 0.25 mg (05/06/2019) pegfilgrastim (NEULASTA ONPRO KIT) injection 6 mg, 6 mg, Subcutaneous, Once, 2 of 6 cycles Administration: 6 mg (04/08/2019), 6 mg (04/22/2019), 6 mg (05/06/2019) dacarbazine (DTIC) 750 mg in sodium chloride 0.9 % 250 mL chemo infusion, 375 mg/m2 = 750 mg, Intravenous,  Once, 2 of 6 cycles Administration: 750 mg (04/08/2019), 750 mg (04/22/2019), 750 mg (05/06/2019) fosaprepitant (EMEND) 150 mg, dexamethasone (DECADRON) 10 mg in sodium chloride 0.9 % 145 mL IVPB,  150 mg, Intravenous,  Once, 2 of 6 cycles Administration: 150 mg (04/08/2019), 150 mg (04/22/2019), 150 mg (05/06/2019) brentuximab vedotin (ADCETRIS) 100 mg in sodium chloride 0.9 % 100 mL chemo infusion, 1.15 mg/kg = 105 mg, Intravenous,  Once, 2 of 6 cycles Administration: 100 mg (04/08/2019), 100 mg (04/22/2019), 100 mg (05/06/2019) vinBLAStine (VELBAN) 12.1 mg in sodium chloride 0.9 % 50 mL chemo infusion, 6 mg/m2 = 12.1 mg, Intravenous, Once, 2 of 6 cycles Administration: 12.1 mg (04/08/2019), 12 mg (04/22/2019), 12.1 mg (05/06/2019)  for chemotherapy treatment.      I have reviewed the past medical history, past surgical history, social history and family history with the patient and they are unchanged from previous note.  ALLERGIES:  has No Known Allergies.  MEDICATIONS:  Current Outpatient Medications  Medication Sig Dispense Refill  . atorvastatin (LIPITOR) 10 MG tablet TAKE 1 TABLET BY MOUTH EVERY DAY (Patient taking differently: Take 10 mg by mouth daily. ) 90 tablet 1  . dexamethasone (DECADRON) 4 MG tablet TAKE 2  TABLETS BY MOUTH ONCE A DAY FOR 3 DAYS AFTER CHEMO. TAKE WITH FOOD. 30 tablet 1  . ferrous sulfate 325 (65 FE) MG tablet Take 1 tablet (325 mg total) by mouth daily with breakfast. 30 tablet 1  . guaiFENesin-dextromethorphan (ROBITUSSIN DM) 100-10 MG/5ML syrup Take 5 mLs by mouth every 4 (four) hours as needed for cough. 118 mL 0  . lidocaine-prilocaine (EMLA) cream Apply to affected area once (Patient taking differently: Apply 1 application topically daily as needed. Port. Apply to affected area once) 30 g 3  . LORazepam (ATIVAN) 0.5 MG tablet TAKE 1 TABLET (0.5 MG TOTAL) BY MOUTH EVERY 6 (SIX) HOURS AS NEEDED (NAUSEA OR VOMITING). 30 tablet 0  . losartan (COZAAR) 100 MG tablet Take 100 mg by mouth daily.    . magnesium oxide (MAG-OX) 400 MG tablet Take by mouth 2 (two) times daily.    . metFORMIN (GLUCOPHAGE) 500 MG tablet TAKE 1 TABLET BY MOUTH TWICE A DAY WITH MEALS  (Patient taking differently: Take 500 mg by mouth 2 (two) times daily with a meal. ) 180 tablet 1  . ondansetron (ZOFRAN) 8 MG tablet TAKE 1 TAB BY MOUTH 2 (TWO) TIMES DAILY AS NEEDED. START ON THE THIRD DAY AFTER CHEMOTHERAPY. 30 tablet 1  . prochlorperazine (COMPAZINE) 10 MG tablet TAKE 1 TABLET (10 MG TOTAL) BY MOUTH EVERY 6 (SIX) HOURS AS NEEDED (NAUSEA OR VOMITING). 30 tablet 1  . sildenafil (VIAGRA) 100 MG tablet Take 0.5-1 tablets (50-100 mg total) by mouth daily as needed for erectile dysfunction. 10 tablet 1   No current facility-administered medications for this visit.    PHYSICAL EXAMINATION: ECOG PERFORMANCE STATUS: 1 - Symptomatic but completely ambulatory  Today's Vitals   06/03/19 0941 06/03/19 0947  BP: (!) 119/59 (!) 119/59  Pulse: 71 71  Resp: 17 20  Temp: (!) 97.1 F (36.2 C) 98 F (36.7 C)  TempSrc: Temporal Temporal  SpO2: 98% 97%  Weight: 181 lb 1.3 oz (82.1 kg) 181 lb 1.3 oz (82.1 kg)  Height: 5' 6"  (1.676 m) 5' 6"  (1.676 m)  PainSc: 0-No pain 0-No pain   Body mass index is 29.23 kg/m.  Filed Weights   06/03/19 0941 06/03/19 0947  Weight: 181 lb 1.3 oz (82.1 kg) 181 lb 1.3 oz (82.1 kg)    GENERAL: alert, no distress and comfortable SKIN: skin color, texture, turgor are normal, no rashes or significant lesions EYES: conjunctiva are pink and non-injected, sclera clear OROPHARYNX: no exudate, no erythema; lips, buccal mucosa, and tongue normal  NECK: supple, non-tender LYMPH:  no palpable lymphadenopathy in the cervical LUNGS: clear to auscultation with normal breathing effort HEART: regular rate & rhythm and no murmurs and no lower extremity edema ABDOMEN: soft, non-tender, non-distended, normal bowel sounds Musculoskeletal: no cyanosis of digits and no clubbing  PSYCH: alert & oriented x 3, fluent speech  LABORATORY DATA:  I have reviewed the data as listed    Component Value Date/Time   NA 139 05/22/2019 0225   K 4.2 05/22/2019 0225   CL 103  05/22/2019 0225   CO2 27 05/22/2019 0225   GLUCOSE 141 (H) 05/22/2019 0225   BUN 21 05/22/2019 0225   CREATININE 0.75 05/22/2019 0225   CREATININE 0.93 05/06/2019 1000   CALCIUM 8.7 (L) 05/22/2019 0225   PROT 5.9 (L) 05/22/2019 0225   ALBUMIN 2.7 (L) 05/22/2019 0225   AST 25 05/22/2019 0225   AST 19 05/06/2019 1000   ALT 35 05/22/2019 0225   ALT  23 05/06/2019 1000   ALKPHOS 52 05/22/2019 0225   BILITOT 0.3 05/22/2019 0225   BILITOT 0.3 05/06/2019 1000   GFRNONAA >60 05/22/2019 0225   GFRNONAA >60 05/06/2019 1000   GFRAA >60 05/22/2019 0225   GFRAA >60 05/06/2019 1000    No results found for: SPEP, UPEP  Lab Results  Component Value Date   WBC 12.5 (H) 06/03/2019   NEUTROABS 8.7 (H) 06/03/2019   HGB 10.4 (L) 06/03/2019   HCT 32.6 (L) 06/03/2019   MCV 89.1 06/03/2019   PLT 272 06/03/2019      Chemistry      Component Value Date/Time   NA 139 05/22/2019 0225   K 4.2 05/22/2019 0225   CL 103 05/22/2019 0225   CO2 27 05/22/2019 0225   BUN 21 05/22/2019 0225   CREATININE 0.75 05/22/2019 0225   CREATININE 0.93 05/06/2019 1000      Component Value Date/Time   CALCIUM 8.7 (L) 05/22/2019 0225   ALKPHOS 52 05/22/2019 0225   AST 25 05/22/2019 0225   AST 19 05/06/2019 1000   ALT 35 05/22/2019 0225   ALT 23 05/06/2019 1000   BILITOT 0.3 05/22/2019 0225   BILITOT 0.3 05/06/2019 1000       RADIOGRAPHIC STUDIES: I have personally reviewed the radiological images as listed below and agreed with the findings in the report. CT ANGIO CHEST PE W OR WO CONTRAST  Result Date: 05/17/2019 CLINICAL DATA:  PE suspected. History of Hodgkin's lymphoma EXAM: CT ANGIOGRAPHY CHEST WITH CONTRAST TECHNIQUE: Multidetector CT imaging of the chest was performed using the standard protocol during bolus administration of intravenous contrast. Multiplanar CT image reconstructions and MIPs were obtained to evaluate the vascular anatomy. CONTRAST:  184m OMNIPAQUE IOHEXOL 350 MG/ML SOLN COMPARISON:   02/28/2019 FINDINGS: Cardiovascular: Heart size upper limits of normal unchanged. No pericardial effusion. Aortic caliber with mild dilation of the ascending thoracic aorta approximately 3.2 cm. Central pulmonary vasculature mildly dilated moderately dilated at 3 cm max caliber. No signs of pulmonary embolism. Mildly limited assessment due to bolus timing and respiratory motion. Left sided Port-A-Cath terminates at the caval to atrial junction. Mediastinum/Nodes: Bulky mediastinal lymphadenopathy is diminished in size. Right paratracheal lymph node 3 x 2.6 cm previously 5.8 x 4.7 cm. Other lymph nodes with similar decrease in size. No signs of axillary adenopathy. Lungs/Pleura: Multiple peripheral and nodular opacities with ground-glass and septal thickening have developed since the study of November of 2020 where there was some mild subpleural reticulation. No signs of dense consolidative change of the lobar distribution. Upper Abdomen: Imaged portions of liver, spleen, adrenal glands and upper portions of the kidneys are unremarkable. Spleen is incompletely imaged as are other upper abdominal viscera. Musculoskeletal: No signs of chest wall lesion. No acute or destructive bone process. Review of the MIP images confirms the above findings. IMPRESSION: 1. No signs of pulmonary embolism. 2. Interval development of multiple peripheral and nodular opacities with ground-glass and septal thickening. Findings of atypical infection compatible with history of COVID-19. 3. Interval decrease in size of bulky mediastinal lymphadenopathy. 4. Mild dilation of the ascending thoracic aorta to 3.2 cm max caliber. Recommend annual imaging followup by CTA or MRA. This recommendation follows 2010 ACCF/AHA/AATS/ACR/ASA/SCA/SCAI/SIR/STS/SVM Guidelines for the Diagnosis and Management of Patients with Thoracic Aortic Disease. Circulation.2010; 121:: W119-J478 Aortic aneurysm NOS (ICD10-I71.9) 5. Mild central pulmonary arterial  dilatation suggestive of pulmonary arterial hypertension. 6. Left-sided Port-A-Cath terminates at the caval to atrial junction. Electronically Signed   By: GCay Schillings  Wile M.D.   On: 05/17/2019 10:49   DG Chest Port 1 View  Result Date: 05/21/2019 CLINICAL DATA:  COVID-19 pneumonia EXAM: PORTABLE CHEST 1 VIEW COMPARISON:  05/17/2019 FINDINGS: Cardiac shadow is stable. Left chest wall port is again seen. Patchy subpleural densities are noted similar to that seen on prior CT examination consistent with the given clinical history. The overall appearance is stable from the prior exam. No sizable effusion is noted. No bony abnormality is seen. IMPRESSION: Bilateral peripheral opacities consistent with the given clinical history. Electronically Signed   By: Inez Catalina M.D.   On: 05/21/2019 09:25   DG Chest Portable 1 View  Result Date: 05/16/2019 CLINICAL DATA:  Shortness of breath and fever EXAM: PORTABLE CHEST 1 VIEW COMPARISON:  April 24, 2019 FINDINGS: The heart size and mediastinal contours are unchanged. Aortic knob calcifications. A left-sided MediPort catheter seen with the tip at the superior cavoatrial junction. Mildly increased interstitial markings are again noted at both lung bases. Overlying surgical clips are seen. No acute osseous abnormality. IMPRESSION: Subsegmental atelectasis or chronic interstitial thickening at both lung bases. No acute cardiopulmonary process. Electronically Signed   By: Prudencio Pair M.D.   On: 05/16/2019 18:52

## 2019-06-03 NOTE — Patient Instructions (Signed)

## 2019-06-03 NOTE — Patient Instructions (Signed)
[facility] Discharge Instructions for Patients Receiving Chemotherapy  Today you received the following chemotherapy agents Doxorubicin, Vinblastine, Dacarbazine and Brentuximab vedotin  To help prevent nausea and vomiting after your treatment, we encourage you to take your nausea medication as prescribed by MD.   If you develop nausea and vomiting that is not controlled by your nausea medication, call the clinic.   BELOW ARE SYMPTOMS THAT SHOULD BE REPORTED IMMEDIATELY:  *FEVER GREATER THAN 100.5 F  *CHILLS WITH OR WITHOUT FEVER  NAUSEA AND VOMITING THAT IS NOT CONTROLLED WITH YOUR NAUSEA MEDICATION  *UNUSUAL SHORTNESS OF BREATH  *UNUSUAL BRUISING OR BLEEDING  TENDERNESS IN MOUTH AND THROAT WITH OR WITHOUT PRESENCE OF ULCERS  *URINARY PROBLEMS  *BOWEL PROBLEMS  UNUSUAL RASH Items with * indicate a potential emergency and should be followed up as soon as possible.  Feel free to call the clinic should you have any questions or concerns. The clinic phone number is [phone].  Please show the [place] at check-in to the Emergency Department and triage nurse.

## 2019-06-04 ENCOUNTER — Encounter: Payer: Self-pay | Admitting: *Deleted

## 2019-06-04 NOTE — Progress Notes (Signed)
Patient will need PET scan prior to next cycle. PET scan scheduled. Patient called and date, time and location reviewed. Pre-PET scan information also reviewed with patient. Will also mail patient appointment reminder and pre-PET prep for reinforcement.

## 2019-06-05 ENCOUNTER — Other Ambulatory Visit: Payer: Self-pay | Admitting: Family

## 2019-06-12 ENCOUNTER — Other Ambulatory Visit: Payer: Self-pay | Admitting: *Deleted

## 2019-06-12 DIAGNOSIS — C8118 Nodular sclerosis classical Hodgkin lymphoma, lymph nodes of multiple sites: Secondary | ICD-10-CM

## 2019-06-12 MED ORDER — PROCHLORPERAZINE MALEATE 10 MG PO TABS: 10.0000 mg | ORAL_TABLET | Freq: Four times a day (QID) | ORAL | 1 refills | Status: DC | PRN

## 2019-06-13 ENCOUNTER — Encounter: Payer: Self-pay | Admitting: *Deleted

## 2019-06-13 NOTE — Progress Notes (Signed)
Patient is scheduled for PET scan tomorrow and next treatment cycle on Monday. He called today to inform us that there was an error with his insurance, and it had been cancelled. He states since him and his wife had prolonged absences at work, the insurance was incorrectly cancelled. She has since "worked it out with her job" but they are unsure when the insurance will be come active again. They were told as early as today, or as late as one day next week.   For now I will hold on cancelling the PET scan for tomorrow. I will call patient back later today and see if they have heard anything about their insurance. If at that time the insurance still hasn't been reinstated, we will cancel the PET.  Spoke to Dr Maylon Peppers. If needed we can get the PET after his next cycle to determine response.  Spoke to [place], our Gaffer and she ran the patient's information and confirmed that patient has active insurance. As such, patient will have scan tomorrow and keep scheduled appointment for treatment on Monday.

## 2019-06-14 ENCOUNTER — Encounter (HOSPITAL_COMMUNITY)
Admission: RE | Admit: 2019-06-14 | Discharge: 2019-06-14 | Disposition: A | Discharge status: Home / Self Care | Payer: BC Managed Care – PPO | Source: Ambulatory Visit | Attending: Hematology | Admitting: Hematology

## 2019-06-14 ENCOUNTER — Other Ambulatory Visit: Payer: Self-pay

## 2019-06-14 DIAGNOSIS — C8118 Nodular sclerosis classical Hodgkin lymphoma, lymph nodes of multiple sites: Secondary | ICD-10-CM | POA: Diagnosis not present

## 2019-06-14 LAB — GLUCOSE, CAPILLARY: Glucose-Capillary: 170 mg/dL — ABNORMAL HIGH (ref 70–99)

## 2019-06-14 MED ORDER — FLUDEOXYGLUCOSE F - 18 (FDG) INJECTION
9.0300 | Freq: Once | INTRAVENOUS | Status: AC | PRN
  Administered 2019-06-14: 9.03 via INTRAVENOUS

## 2019-06-17 ENCOUNTER — Encounter: Payer: Self-pay | Admitting: *Deleted

## 2019-06-17 ENCOUNTER — Other Ambulatory Visit: Payer: Self-pay

## 2019-06-17 ENCOUNTER — Inpatient Hospital Stay: Payer: BC Managed Care – PPO

## 2019-06-17 ENCOUNTER — Inpatient Hospital Stay: Payer: BC Managed Care – PPO | Attending: Hematology | Admitting: Hematology

## 2019-06-17 ENCOUNTER — Encounter: Payer: Self-pay | Admitting: Hematology

## 2019-06-17 VITALS — BP 117/71 | HR 84 | Temp 97.5°F | Resp 18 | Ht 66.0 in | Wt 177.0 lb

## 2019-06-17 DIAGNOSIS — Z7984 Long term (current) use of oral hypoglycemic drugs: Secondary | ICD-10-CM | POA: Insufficient documentation

## 2019-06-17 DIAGNOSIS — T451X5A Adverse effect of antineoplastic and immunosuppressive drugs, initial encounter: Secondary | ICD-10-CM | POA: Diagnosis not present

## 2019-06-17 DIAGNOSIS — Z7689 Persons encountering health services in other specified circumstances: Secondary | ICD-10-CM | POA: Diagnosis not present

## 2019-06-17 DIAGNOSIS — D6481 Anemia due to antineoplastic chemotherapy: Secondary | ICD-10-CM | POA: Diagnosis not present

## 2019-06-17 DIAGNOSIS — C8118 Nodular sclerosis classical Hodgkin lymphoma, lymph nodes of multiple sites: Secondary | ICD-10-CM

## 2019-06-17 DIAGNOSIS — Z5111 Encounter for antineoplastic chemotherapy: Secondary | ICD-10-CM | POA: Diagnosis not present

## 2019-06-17 DIAGNOSIS — G620 Drug-induced polyneuropathy: Secondary | ICD-10-CM | POA: Insufficient documentation

## 2019-06-17 DIAGNOSIS — D638 Anemia in other chronic diseases classified elsewhere: Secondary | ICD-10-CM | POA: Insufficient documentation

## 2019-06-17 DIAGNOSIS — Z79899 Other long term (current) drug therapy: Secondary | ICD-10-CM | POA: Insufficient documentation

## 2019-06-17 DIAGNOSIS — Z7952 Long term (current) use of systemic steroids: Secondary | ICD-10-CM | POA: Diagnosis not present

## 2019-06-17 DIAGNOSIS — Z5112 Encounter for antineoplastic immunotherapy: Secondary | ICD-10-CM | POA: Insufficient documentation

## 2019-06-17 DIAGNOSIS — R110 Nausea: Secondary | ICD-10-CM | POA: Diagnosis not present

## 2019-06-17 LAB — CMP (CANCER CENTER ONLY)
ALT: 11 U/L (ref 0–44)
AST: 10 U/L — ABNORMAL LOW (ref 15–41)
Albumin: 3.6 g/dL (ref 3.5–5.0)
Alkaline Phosphatase: 55 U/L (ref 38–126)
Anion gap: 9 (ref 5–15)
BUN: 7 mg/dL — ABNORMAL LOW (ref 8–23)
CO2: 27 mmol/L (ref 22–32)
Calcium: 9.3 mg/dL (ref 8.9–10.3)
Chloride: 103 mmol/L (ref 98–111)
Creatinine: 0.78 mg/dL (ref 0.61–1.24)
GFR, Est AFR Am: >60 mL/min (ref >60)
GFR, Estimated: >60 mL/min (ref >60)
Glucose, Bld: 190 mg/dL — ABNORMAL HIGH (ref 70–99)
Potassium: 4.1 mmol/L (ref 3.5–5.1)
Sodium: 139 mmol/L (ref 135–145)
Total Bilirubin: 0.5 mg/dL (ref 0.3–1.2)
Total Protein: 6.8 g/dL (ref 6.5–8.1)

## 2019-06-17 LAB — CBC WITH DIFFERENTIAL (CANCER CENTER ONLY)
Abs Immature Granulocytes: 0.27 10*3/uL — ABNORMAL HIGH (ref 0.00–0.07)
Basophils Absolute: 0.1 10*3/uL (ref 0.0–0.1)
Basophils Relative: 1 %
Eosinophils Absolute: 0.2 10*3/uL (ref 0.0–0.5)
Eosinophils Relative: 2 %
HCT: 30.1 % — ABNORMAL LOW (ref 39.0–52.0)
Hemoglobin: 9.6 g/dL — ABNORMAL LOW (ref 13.0–17.0)
Immature Granulocytes: 2 %
Lymphocytes Relative: 13 %
Lymphs Abs: 1.6 10*3/uL (ref 0.7–4.0)
MCH: 28.6 pg (ref 26.0–34.0)
MCHC: 31.9 g/dL (ref 30.0–36.0)
MCV: 89.6 fL (ref 80.0–100.0)
Monocytes Absolute: 1.4 10*3/uL — ABNORMAL HIGH (ref 0.1–1.0)
Monocytes Relative: 12 %
Neutro Abs: 8.4 10*3/uL — ABNORMAL HIGH (ref 1.7–7.7)
Neutrophils Relative %: 70 %
Platelet Count: 262 10*3/uL (ref 150–400)
RBC: 3.36 MIL/uL — ABNORMAL LOW (ref 4.22–5.81)
RDW: 19.9 % — ABNORMAL HIGH (ref 11.5–15.5)
WBC Count: 11.9 10*3/uL — ABNORMAL HIGH (ref 4.0–10.5)
nRBC: 0 % (ref 0.0–0.2)

## 2019-06-17 MED ORDER — PALONOSETRON HCL INJECTION 0.25 MG/5ML
0.2500 mg | Freq: Once | INTRAVENOUS | Status: AC
  Administered 2019-06-17: 0.25 mg via INTRAVENOUS

## 2019-06-17 MED ORDER — SODIUM CHLORIDE 0.9 % IV SOLN
Freq: Once | INTRAVENOUS | Status: AC
  Filled 2019-06-17: qty 250

## 2019-06-17 MED ORDER — ACETAMINOPHEN 325 MG PO TABS
ORAL_TABLET | ORAL | Status: AC
  Filled 2019-06-17: qty 2

## 2019-06-17 MED ORDER — PEGFILGRASTIM 6 MG/0.6ML ~~LOC~~ PSKT
PREFILLED_SYRINGE | SUBCUTANEOUS | Status: AC
  Filled 2019-06-17: qty 0.6

## 2019-06-17 MED ORDER — SODIUM CHLORIDE 0.9 % IV SOLN
1.1500 mg/kg | Freq: Once | INTRAVENOUS | Status: AC
  Administered 2019-06-17: 100 mg via INTRAVENOUS
  Filled 2019-06-17: qty 20

## 2019-06-17 MED ORDER — PEGFILGRASTIM 6 MG/0.6ML ~~LOC~~ PSKT
6.0000 mg | PREFILLED_SYRINGE | Freq: Once | SUBCUTANEOUS | Status: AC
  Administered 2019-06-17: 6 mg via SUBCUTANEOUS

## 2019-06-17 MED ORDER — DEXAMETHASONE SODIUM PHOSPHATE 10 MG/ML IJ SOLN
INTRAMUSCULAR | Status: AC
  Filled 2019-06-17: qty 1

## 2019-06-17 MED ORDER — DOXORUBICIN HCL CHEMO IV INJECTION 2 MG/ML
25.0000 mg/m2 | Freq: Once | INTRAVENOUS | Status: AC
  Administered 2019-06-17: 50 mg via INTRAVENOUS
  Filled 2019-06-17: qty 25

## 2019-06-17 MED ORDER — SODIUM CHLORIDE 0.9 % IV SOLN
375.0000 mg/m2 | Freq: Once | INTRAVENOUS | Status: AC
  Administered 2019-06-17: 750 mg via INTRAVENOUS
  Filled 2019-06-17: qty 75

## 2019-06-17 MED ORDER — DIPHENHYDRAMINE HCL 50 MG/ML IJ SOLN
INTRAMUSCULAR | Status: AC
  Filled 2019-06-17: qty 1

## 2019-06-17 MED ORDER — ACETAMINOPHEN 325 MG PO TABS
650.0000 mg | ORAL_TABLET | Freq: Once | ORAL | Status: AC
  Administered 2019-06-17: 650 mg via ORAL

## 2019-06-17 MED ORDER — SODIUM CHLORIDE 0.9% FLUSH
10.0000 mL | INTRAVENOUS | Status: DC | PRN
  Administered 2019-06-17: 10 mL
  Filled 2019-06-17: qty 10

## 2019-06-17 MED ORDER — VINBLASTINE SULFATE CHEMO INJECTION 1 MG/ML
6.0000 mg/m2 | Freq: Once | INTRAVENOUS | Status: AC
  Administered 2019-06-17: 12.1 mg via INTRAVENOUS
  Filled 2019-06-17: qty 12.1

## 2019-06-17 MED ORDER — PALONOSETRON HCL INJECTION 0.25 MG/5ML
INTRAVENOUS | Status: AC
  Filled 2019-06-17: qty 5

## 2019-06-17 MED ORDER — DEXAMETHASONE SODIUM PHOSPHATE 10 MG/ML IJ SOLN
10.0000 mg | Freq: Once | INTRAMUSCULAR | Status: AC
  Administered 2019-06-17: 10 mg via INTRAVENOUS

## 2019-06-17 MED ORDER — HEPARIN SOD (PORK) LOCK FLUSH 100 UNIT/ML IV SOLN
500.0000 [IU] | Freq: Once | INTRAVENOUS | Status: AC | PRN
  Administered 2019-06-17: 500 [IU]
  Filled 2019-06-17: qty 5

## 2019-06-17 MED ORDER — DIPHENHYDRAMINE HCL 50 MG/ML IJ SOLN
50.0000 mg | Freq: Once | INTRAMUSCULAR | Status: AC
  Administered 2019-06-17: 50 mg via INTRAVENOUS

## 2019-06-17 MED ORDER — SODIUM CHLORIDE 0.9 % IV SOLN
150.0000 mg | Freq: Once | INTRAVENOUS | Status: AC
  Administered 2019-06-17: 150 mg via INTRAVENOUS
  Filled 2019-06-17: qty 150

## 2019-06-17 NOTE — Patient Instructions (Signed)
A 

## 2019-06-17 NOTE — Patient Instructions (Signed)
Implanted Port Insertion, Care After °This sheet gives you information about how to care for yourself after your procedure. Your health care provider may also give you more specific instructions. If you have problems or questions, contact your health care provider. °What can I expect after the procedure? °After the procedure, it is common to have: °· Discomfort at the port insertion site. °· Bruising on the skin over the port. This should improve over 3-4 days. °Follow these instructions at home: °Port care °· After your port is placed, you will get a manufacturer's information card. The card has information about your port. Keep this card with you at all times. °· Take care of the port as told by your health care provider. Ask your health care provider if you or a family member can get training for taking care of the port at home. A home health care nurse may also take care of the port. °· Make sure to remember what type of port you have. °Incision care ° °  ° °· Follow instructions from your health care provider about how to take care of your port insertion site. Make sure you: °? Wash your hands with soap and water before and after you change your bandage (dressing). If soap and water are not available, use hand sanitizer. °? Change your dressing as told by your health care provider. °? Leave stitches (sutures), skin glue, or adhesive strips in place. These skin closures may need to stay in place for 2 weeks or longer. If adhesive strip edges start to loosen and curl up, you may trim the loose edges. Do not remove adhesive strips completely unless your health care provider tells you to do that. °· Check your port insertion site every day for signs of infection. Check for: °? Redness, swelling, or pain. °? Fluid or blood. °? Warmth. °? Pus or a bad smell. °Activity °· Return to your normal activities as told by your health care provider. Ask your health care provider what activities are safe for you. °· Do not  lift anything that is heavier than 10 lb (4.5 kg), or the limit that you are told, until your health care provider says that it is safe. °General instructions °· Take over-the-counter and prescription medicines only as told by your health care provider. °· Do not take baths, swim, or use a hot tub until your health care provider approves. Ask your health care provider if you may take showers. You may only be allowed to take sponge baths. °· Do not drive for 24 hours if you were given a sedative during your procedure. °· Wear a medical alert bracelet in case of an emergency. This will tell any health care providers that you have a port. °· Keep all follow-up visits as told by your health care provider. This is important. °Contact a health care provider if: °· You cannot flush your port with saline as directed, or you cannot draw blood from the port. °· You have a fever or chills. °· You have redness, swelling, or pain around your port insertion site. °· You have fluid or blood coming from your port insertion site. °· Your port insertion site feels warm to the touch. °· You have pus or a bad smell coming from the port insertion site. °Get help right away if: °· You have chest pain or shortness of breath. °· You have bleeding from your port that you cannot control. °Summary °· Take care of the port as told by your health   care provider. Keep the manufacturer's information card with you at all times. °· Change your dressing as told by your health care provider. °· Contact a health care provider if you have a fever or chills or if you have redness, swelling, or pain around your port insertion site. °· Keep all follow-up visits as told by your health care provider. °This information is not intended to replace advice given to you by your health care provider. Make sure you discuss any questions you have with your health care provider. °Document Revised: 10/24/2017 Document Reviewed: 10/24/2017 °Elsevier Patient Education ©  2020 [employer] ° °

## 2019-06-17 NOTE — Progress Notes (Signed)
[facility] [facility] OFFICE PROGRESS NOTE  Patient Care Team: Saguier, [name] as PCP - General (Internal Medicine) Tish Men, MD as Medical Oncologist (Oncology) Cordelia Poche, RN as Oncology Nurse Navigator  HEME/ONC OVERVIEW: 1. Classic Hodgkin lymphoma, nodular sclerosis subtype; at least Stage III -02/2019: enlarged R supraclavicular LN's (~4 and 3cm) on neck US   Enlarged cervical, bulky R supraclavicular, mediastinal, and RP adenopathy  Core R cervical LN bx non-diagnostic   Excisional LN bx showed classic Hodgkin lymphoma, nodular sclerosis subtype   FDG-avid LN's in neck, chest and abdomen; no bony abnormalities  -Late 03/2019 - present: AVD + Adcetris  Interim PET after 2 cycles showed CR (Deauville 1)   TREATMENT SUMMARY:  04/08/2019 - present: AVD + Adcetris with Onpro   ASSESSMENT & PLAN:    Stage III Classic Hodgkin lymphoma, nodular sclerosis subtype -S/p 2 cycles of AVD + Adcetris; treatment delayed due to Covid and neutropenia fever  -I independently reviewed the radiologic images of PET, and agree with findings documented.  In summary, PET after 2 cycles of AVD + Adcetris showed complete metabolic response (Deauville 1).  There was no evidence of worsening or new disease. -I reviewed imaging results in detail with patient.  Given the excellent response, we will continue the current regimen for additional 4 cycles (for total of 6 cycles). 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  All questions were answered. The patient knows to call the clinic with any problems, questions or concerns. No barriers to learning was detected.  Return in 2 weeks for C3D15 of chemotherapy.   Tish Men, MD 3/8/202111:49 AM  CHIEF COMPLAINT: "I am doing fine"  INTERVAL HISTORY: Mr. Dapolito returns clinic for follow-up of Stage III classical Hodgkin lymphoma on AVD + Adcetris.  Patient reports that he has been tolerating treatment well, and denies any significant side effects, except for one episode of diarrhea that happened 3 days after chemotherapy, for which he took one dose of Imodium with resolution of symptoms.  His neuropathy in the hands is improving, and he denies any interference with ADLs.  He denies any other complaint today.  REVIEW OF SYSTEMS:   Constitutional: ( - ) fevers, ( - )  chills , ( - ) night sweats Eyes: ( - ) blurriness of vision, ( - ) double vision, ( - ) watery eyes Ears, nose, mouth, throat, and face: ( - ) mucositis, ( - ) sore throat Respiratory: ( - ) cough, ( - ) dyspnea, ( - ) wheezes Cardiovascular: ( - ) palpitation, ( - ) chest discomfort, ( - ) lower extremity swelling Gastrointestinal:  ( - ) nausea, ( - ) heartburn, ( + ) change in bowel habits Skin: ( - ) abnormal skin rashes Lymphatics: ( - ) new lymphadenopathy, ( - ) easy bruising  Neurological: ( - ) numbness, ( - ) tingling, ( - ) new weaknesses Behavioral/Psych: ( - ) mood change, ( - ) new changes  All other systems were reviewed with the patient and are negative.  SUMMARY OF ONCOLOGIC HISTORY: Oncology History  Hodgkin lymphoma ([place])  02/25/2019 Initial Diagnosis   Hodgkin lymphoma ([place])    02/28/2019 Imaging   CT neck: IMPRESSION: Right paratracheal enlarged lymph node and right supraclavicular enlarged lymph nodes compatible with neoplasm. No pharyngeal mass in the neck. No other adenopathy in the neck. Tissue sampling is recommended.   02/28/2019 Imaging   CT CAP: IMPRESSION: 1. Bulky right supraclavicular and mediastinal adenopathy most compatible with metastatic disease. Additionally there is porta hepatic and retroperitoneal adenopathy within the abdomen concerning for metastatic disease. 2. Mild fibrotic changes involving the lungs bilaterally. 3. See dedicated neck CT report.   03/08/2019 Pathology Results   SURGICAL PATHOLOGY  CASE: [id number]  PATIENT: Tami [name]  Surgical Pathology Report      Clinical History: No known primary, now with right supraclavicular  lymphadenopathy, post US guided Bx. (cm)      FINAL MICROSCOPIC DIAGNOSIS:   A. LYMPH NODE, RIGHT SUPRACLAVICULAR, BIOPSY:  - Atypical lymphoid proliferation  - See comment   COMMENT:   The sections show needle core biopsy fragments of lymph nodal tissue  displaying predominance of small round to slightly irregular lymphocytes  admixed with a minor population of scattered large atypical mononuclear  and multilobated lymphoid appearing cells with variably prominent  nucleoli.  This is admixed with scattering of eosinophils in some areas.  Flow cytometric analysis was attempted but there was insufficient  material present in the sample ([place]).  Immunohistochemical  stains were performed including CD10, CD15, CD20, CD30, CD5, LCA, cyclin  D1, PAX 5, CD3 and EBV with appropriate controls.  LCA is diffusely  positive.  The small lymphoid cells show a mixture of T and B cells with  predominance of T cells.  No significant EBV, CD10 or cyclin D1  positivity is identified.  The larger atypical lymphoid cells appear to  be positive for CD30 and some for CD15 and PAX 5.  The  overall findings  are very limited but atypical and worrisome for a lymphoproliferative  process particularly Hodgkin lymphoma.  Excisional biopsy is recommended  including fresh tissue for lymphoma work-up in order to further evaluate  this process    03/20/2019 Pathology Results   FINAL MICROSCOPIC DIAGNOSIS:   A. LYMPH NODE, DEEP RIGHT CERVICAL POSTERIOR TRIANGLE, EXCISION:  -Classical Hodgkin lymphoma  -See comment   COMMENT:   Sections of the lymph node show effacement of the architecture by a  vaguely nodular lymphoproliferative process characterized by a  polymorphous cellular proliferation of small lymphocytes, eosinophils,  plasma cells in addition to variable numbers of large atypical  mononuclear and multi-lobated lymphoid appearing cells with variably  prominent nucleoli characteristic of Reed-Sternberg cells and variants  including lacunar cells.  Areas of early fibrosis surrounding some of  the nodules are also present.  Flow cytometric analysis was performed  ([id number]) and shows predominance of T lymphocytes with nonspecific  changes in addition to a minor polyclonal B-cell population with no  abnormal phenotype. Immunohistochemical stains were performed including  CD20, CD3, PAX 5, CD15, CD30, LCA and in situ hybridization for EBV with  appropriate controls.  The large atypical lymphoid appearing cells are  positive for CD15, CD30 and weakly for PAX 5 and negative for CD3, CD20,  LCA  and EBV.  The small lymphocytes in the background show a mixture of  T and B-cells with predominance of T-cells. The morphologic and  immunophenotypic features are consistent with classical Hodgkin lymphoma  which is best subclassified as nodular sclerosis type.    04/03/2019 Imaging   PET: IMPRESSION: 1. Hypermetabolic lymphadenopathy involving the neck, chest and abdomen consistent with known Hodgkin's lymphoma. Deauville 5. 2. No findings for osseous metastatic disease.    04/03/2019 Cancer Staging   Staging form: Hodgkin and Non-Hodgkin Lymphoma, AJCC 8th Edition - Clinical stage from 04/03/2019: Stage III (Hodgkin lymphoma) - Signed by Tish Men, MD on 04/08/2019   04/08/2019 -  Chemotherapy   The patient had DOXOrubicin (ADRIAMYCIN) chemo injection 50 mg, 25 mg/m2 = 50 mg, Intravenous,  Once, 3 of 6 cycles Administration: 50 mg (04/08/2019), 50 mg (04/22/2019), 50 mg (05/06/2019), 50 mg (06/03/2019) palonosetron (ALOXI) injection 0.25 mg, 0.25 mg, Intravenous,  Once, 3 of 6 cycles Administration: 0.25 mg (04/08/2019), 0.25 mg (04/22/2019), 0.25 mg (05/06/2019), 0.25 mg (06/03/2019) pegfilgrastim (NEULASTA ONPRO KIT) injection 6 mg, 6 mg, Subcutaneous, Once, 3 of 6 cycles Administration: 6 mg (04/08/2019), 6 mg (04/22/2019), 6 mg (05/06/2019), 6 mg (06/03/2019) dacarbazine (DTIC) 750 mg in sodium chloride 0.9 % 250 mL chemo infusion, 375 mg/m2 = 750 mg, Intravenous,  Once, 3 of 6 cycles Administration: 750 mg (04/08/2019), 750 mg (04/22/2019), 750 mg (05/06/2019), 750 mg (06/03/2019) fosaprepitant (EMEND) 150 mg, dexamethasone (DECADRON) 10 mg in sodium chloride 0.9 % 145 mL IVPB, 150 mg, Intravenous,  Once, 3 of 6 cycles Administration: 150 mg (04/08/2019), 150 mg (04/22/2019), 150 mg (05/06/2019), 150 mg (06/03/2019) brentuximab vedotin (ADCETRIS) 100 mg in sodium chloride 0.9 % 100 mL chemo infusion, 1.15 mg/kg = 105 mg, Intravenous,  Once, 3 of 6 cycles Administration: 100 mg (04/08/2019), 100 mg (04/22/2019), 100 mg (05/06/2019), 100 mg (06/03/2019) vinBLAStine (VELBAN) 12.1 mg in sodium chloride 0.9 % 50 mL chemo infusion, 6 mg/m2 = 12.1 mg, Intravenous, Once, 3 of 6 cycles Administration: 12.1 mg (04/08/2019), 12 mg (04/22/2019), 12.1 mg (05/06/2019), 12.1 mg (06/03/2019)  for chemotherapy treatment.      I have reviewed the past medical history, past surgical history, social history and family history with the patient and they are unchanged from previous  note.  ALLERGIES:  has No Known Allergies.  MEDICATIONS:  Current Outpatient Medications  Medication Sig Dispense Refill  . atorvastatin (LIPITOR) 10 MG tablet TAKE 1 TABLET BY MOUTH EVERY DAY (Patient taking differently: Take 10 mg by mouth daily. ) 90 tablet 1  . dexamethasone (DECADRON) 4 MG tablet TAKE 2 TABLETS BY MOUTH ONCE A DAY FOR 3 DAYS AFTER CHEMO. TAKE WITH FOOD. 30 tablet 1  . ferrous sulfate 325 (65 FE) MG tablet Take 1 tablet (325 mg total) by mouth daily with breakfast. 30 tablet 1  . lidocaine-prilocaine (EMLA) cream Apply to affected area once (Patient taking differently: Apply 1 application topically daily as needed. Port. Apply to affected area once) 30 g 3  . LORazepam (ATIVAN) 0.5 MG tablet TAKE 1 TABLET (0.5 MG TOTAL) BY MOUTH EVERY 6 (SIX) HOURS AS NEEDED (NAUSEA OR VOMITING). 30 tablet 0  . losartan (COZAAR) 100 MG tablet Take 100 mg by mouth daily.    . magnesium oxide (MAG-OX) 400 MG tablet Take by mouth 2 (two) times daily.    . metFORMIN (GLUCOPHAGE) 500 MG tablet TAKE 1 TABLET BY MOUTH TWICE A DAY WITH MEALS (Patient taking differently: Take 500 mg by mouth 2 (  two) times daily with a meal. ) 180 tablet 1  . sildenafil (VIAGRA) 100 MG tablet Take 0.5-1 tablets (50-100 mg total) by mouth daily as needed for erectile dysfunction. 10 tablet 1  . guaiFENesin-dextromethorphan (ROBITUSSIN DM) 100-10 MG/5ML syrup Take 5 mLs by mouth every 4 (four) hours as needed for cough. (Patient not taking: Reported on 06/17/2019) 118 mL 0  . ondansetron (ZOFRAN) 8 MG tablet TAKE 1 TAB BY MOUTH 2 (TWO) TIMES DAILY AS NEEDED. START ON THE THIRD DAY AFTER CHEMOTHERAPY. (Patient not taking: Reported on 06/17/2019) 30 tablet 1  . prochlorperazine (COMPAZINE) 10 MG tablet Take 1 tablet (10 mg total) by mouth every 6 (six) hours as needed (Nausea or vomiting). (Patient not taking: Reported on 06/17/2019) 30 tablet 1   No current facility-administered medications for this visit.    Facility-Administered Medications Ordered in Other Visits  Medication Dose Route Frequency Provider Last Rate Last Admin  . brentuximab vedotin (ADCETRIS) 100 mg in sodium chloride 0.9 % 100 mL chemo infusion  1.15 mg/kg (Treatment Plan Recorded) Intravenous Once Tish Men, MD      . dacarbazine (DTIC) 750 mg in sodium chloride 0.9 % 250 mL chemo infusion  375 mg/m2 (Treatment Plan Recorded) Intravenous Once Tish Men, MD      . dexamethasone (DECADRON) injection 10 mg  10 mg Intravenous Once Tish Men, MD      . DOXOrubicin (ADRIAMYCIN) chemo injection 50 mg  25 mg/m2 (Treatment Plan Recorded) Intravenous Once Tish Men, MD      . fosaprepitant (EMEND) 150 mg in sodium chloride 0.9 % 145 mL IVPB  150 mg Intravenous Once Tish Men, MD      . heparin lock flush 100 unit/mL  500 Units Intracatheter Once PRN Tish Men, MD      . pegfilgrastim (NEULASTA ONPRO KIT) injection 6 mg  6 mg Subcutaneous Once Tish Men, MD      . sodium chloride flush (NS) 0.9 % injection 10 mL  10 mL Intracatheter PRN Tish Men, MD      . vinBLAStine (VELBAN) 12.1 mg in sodium chloride 0.9 % 50 mL chemo infusion  6 mg/m2 (Treatment Plan Recorded) Intravenous Once Tish Men, MD        PHYSICAL EXAMINATION: ECOG PERFORMANCE STATUS: 1 - Symptomatic but completely ambulatory  Today's Vitals   06/17/19 1105  BP: 117/71  Pulse: 84  Resp: 18  Temp: (!) 97.5 F (36.4 C)  TempSrc: Temporal  SpO2: 98%  Weight: 177 lb (80.3 kg)  Height: 5' 6"  (1.676 m)  PainSc: 0-No pain   Body mass index is 28.57 kg/m.  Filed Weights   06/17/19 1105  Weight: 177 lb (80.3 kg)    GENERAL: alert, no distress and comfortable SKIN: skin color, texture, turgor are normal, no rashes or significant lesions EYES: conjunctiva are pink and non-injected, sclera clear OROPHARYNX: no exudate, no erythema; lips, buccal mucosa, and tongue normal  NECK: supple, non-tender LYMPH:  no palpable lymphadenopathy in the cervical LUNGS: clear to  auscultation with normal breathing effort HEART: regular rate & rhythm and no murmurs and no lower extremity edema ABDOMEN: soft, non-tender, non-distended, normal bowel sounds Musculoskeletal: no cyanosis of digits and no clubbing  PSYCH: alert & oriented x 3, fluent speech  LABORATORY DATA:  I have reviewed the data as listed    Component Value Date/Time   NA 139 06/17/2019 1103   K 4.1 06/17/2019 1103   CL 103 06/17/2019 1103   CO2 27 06/17/2019  1103   GLUCOSE 190 (H) 06/17/2019 1103   BUN 7 (L) 06/17/2019 1103   CREATININE 0.78 06/17/2019 1103   CALCIUM 9.3 06/17/2019 1103   PROT 6.8 06/17/2019 1103   ALBUMIN 3.6 06/17/2019 1103   AST 10 (L) 06/17/2019 1103   ALT 11 06/17/2019 1103   ALKPHOS 55 06/17/2019 1103   BILITOT 0.5 06/17/2019 1103   GFRNONAA >60 06/17/2019 1103   GFRAA >60 06/17/2019 1103    No results found for: SPEP, UPEP  Lab Results  Component Value Date   WBC 11.9 (H) 06/17/2019   NEUTROABS 8.4 (H) 06/17/2019   HGB 9.6 (L) 06/17/2019   HCT 30.1 (L) 06/17/2019   MCV 89.6 06/17/2019   PLT 262 06/17/2019      Chemistry      Component Value Date/Time   NA 139 06/17/2019 1103   K 4.1 06/17/2019 1103   CL 103 06/17/2019 1103   CO2 27 06/17/2019 1103   BUN 7 (L) 06/17/2019 1103   CREATININE 0.78 06/17/2019 1103      Component Value Date/Time   CALCIUM 9.3 06/17/2019 1103   ALKPHOS 55 06/17/2019 1103   AST 10 (L) 06/17/2019 1103   ALT 11 06/17/2019 1103   BILITOT 0.5 06/17/2019 1103       RADIOGRAPHIC STUDIES: I have personally reviewed the radiological images as listed below and agreed with the findings in the report. NM PET Image Restag (PS) Skull Base To Thigh  Result Date: 06/14/2019 CLINICAL DATA:  Subsequent treatment strategy for Hodgkin's lymphoma. EXAM: NUCLEAR MEDICINE PET SKULL BASE TO THIGH TECHNIQUE: 9.03 mCi F-18 FDG was injected intravenously. Full-ring PET imaging was performed from the skull base to thigh after the radiotracer.  CT data was obtained and used for attenuation correction and anatomic localization. Fasting blood glucose: 170 mg/dl COMPARISON:  PET-CT 04/03/2019. FINDINGS: Mediastinal blood pool activity: SUV max 2.1 Liver activity: SUV max 3.2 NECK: No hypermetabolic lymph nodes in the neck. In addition, previously noted right supraclavicular lymph node has significantly decreased in size, currently measuring 1.2 cm in short axis (axial image 54 of series 4) and is no longer hypermetabolic (SUVmax = 1.7 versus 12.3 on the prior)) . Incidental CT findings: none CHEST: No hypermetabolic mediastinal or hilar nodes. Previously noted enlarged right paratracheal lymph node has decreased in size and currently measures 2.3 cm in short axis (axial image 65 of series 4) and is no longer hypermetabolic (SUVmax = 2.2 versus 11.7 on the prior)) . No suspicious pulmonary nodules on the CT scan. Incidental CT findings: Left internal jugular single-lumen porta cath with tip terminating in the superior aspect of the right atrium. Aortic atherosclerosis. ABDOMEN/PELVIS: No abnormal hypermetabolic activity within the liver, pancreas, adrenal glands, or spleen. No hypermetabolic lymph nodes in the abdomen or pelvis. Incidental CT findings: none SKELETON: Mild diffuse hypermetabolism throughout the visualized skeletal structures without destructive osseous lesions, most likely to reflect rebound marrow response. No focal hypermetabolic activity to suggest skeletal metastasis. Bilateral hip arthroplasties. Incidental CT findings: none IMPRESSION: 1. Complete metabolic response to therapy (Deauville 1), as above. 2. Additional incidental findings, as above. Electronically Signed   By: Vinnie Langton M.D.   On: 06/14/2019 11:00   DG Chest Port 1 View  Result Date: 05/21/2019 CLINICAL DATA:  COVID-19 pneumonia EXAM: PORTABLE CHEST 1 VIEW COMPARISON:  05/17/2019 FINDINGS: Cardiac shadow is stable. Left chest wall port is again seen. Patchy  subpleural densities are noted similar to that seen on prior CT examination consistent  with the given clinical history. The overall appearance is stable from the prior exam. No sizable effusion is noted. No bony abnormality is seen. IMPRESSION: Bilateral peripheral opacities consistent with the given clinical history. Electronically Signed   By: Inez Catalina M.D.   On: 05/21/2019 09:25

## 2019-06-18 ENCOUNTER — Telehealth: Payer: Self-pay | Admitting: Hematology

## 2019-06-18 NOTE — Telephone Encounter (Signed)
No los 3/8

## 2019-07-01 ENCOUNTER — Inpatient Hospital Stay: Payer: BC Managed Care – PPO

## 2019-07-01 ENCOUNTER — Ambulatory Visit (HOSPITAL_BASED_OUTPATIENT_CLINIC_OR_DEPARTMENT_OTHER)
Admission: RE | Admit: 2019-07-01 | Discharge: 2019-07-01 | Disposition: A | Discharge status: Home / Self Care | Payer: BC Managed Care – PPO | Source: Ambulatory Visit | Attending: Hematology | Admitting: Hematology

## 2019-07-01 ENCOUNTER — Other Ambulatory Visit: Payer: Self-pay

## 2019-07-01 ENCOUNTER — Inpatient Hospital Stay (HOSPITAL_BASED_OUTPATIENT_CLINIC_OR_DEPARTMENT_OTHER): Payer: BC Managed Care – PPO | Admitting: Hematology

## 2019-07-01 ENCOUNTER — Encounter: Payer: Self-pay | Admitting: Hematology

## 2019-07-01 VITALS — BP 104/64 | HR 81 | Resp 17

## 2019-07-01 VITALS — BP 93/56 | HR 80 | Temp 97.1°F | Resp 18 | Ht 66.0 in | Wt 164.8 lb

## 2019-07-01 DIAGNOSIS — C8118 Nodular sclerosis classical Hodgkin lymphoma, lymph nodes of multiple sites: Secondary | ICD-10-CM

## 2019-07-01 DIAGNOSIS — D72829 Elevated white blood cell count, unspecified: Secondary | ICD-10-CM

## 2019-07-01 DIAGNOSIS — R109 Unspecified abdominal pain: Secondary | ICD-10-CM | POA: Insufficient documentation

## 2019-07-01 DIAGNOSIS — R059 Cough, unspecified: Secondary | ICD-10-CM

## 2019-07-01 DIAGNOSIS — D6481 Anemia due to antineoplastic chemotherapy: Secondary | ICD-10-CM

## 2019-07-01 DIAGNOSIS — R05 Cough: Secondary | ICD-10-CM | POA: Diagnosis not present

## 2019-07-01 DIAGNOSIS — T451X5A Adverse effect of antineoplastic and immunosuppressive drugs, initial encounter: Secondary | ICD-10-CM

## 2019-07-01 DIAGNOSIS — G620 Drug-induced polyneuropathy: Secondary | ICD-10-CM

## 2019-07-01 LAB — CMP (CANCER CENTER ONLY)
ALT: 10 U/L (ref 0–44)
AST: 13 U/L — ABNORMAL LOW (ref 15–41)
Albumin: 3.3 g/dL — ABNORMAL LOW (ref 3.5–5.0)
Alkaline Phosphatase: 63 U/L (ref 38–126)
Anion gap: 13 (ref 5–15)
BUN: 20 mg/dL (ref 8–23)
CO2: 24 mmol/L (ref 22–32)
Calcium: 8.3 mg/dL — ABNORMAL LOW (ref 8.9–10.3)
Chloride: 97 mmol/L — ABNORMAL LOW (ref 98–111)
Creatinine: 1.98 mg/dL — ABNORMAL HIGH (ref 0.61–1.24)
GFR, Est AFR Am: 40 mL/min — ABNORMAL LOW
GFR, Estimated: 35 mL/min — ABNORMAL LOW
Glucose, Bld: 207 mg/dL — ABNORMAL HIGH (ref 70–99)
Potassium: 3.3 mmol/L — ABNORMAL LOW (ref 3.5–5.1)
Sodium: 134 mmol/L — ABNORMAL LOW (ref 135–145)
Total Bilirubin: 0.5 mg/dL (ref 0.3–1.2)
Total Protein: 6.6 g/dL (ref 6.5–8.1)

## 2019-07-01 LAB — CBC WITH DIFFERENTIAL (CANCER CENTER ONLY)
Abs Immature Granulocytes: 0.91 10*3/uL — ABNORMAL HIGH (ref 0.00–0.07)
Basophils Absolute: 0.1 10*3/uL (ref 0.0–0.1)
Basophils Relative: 1 %
Eosinophils Absolute: 0 10*3/uL (ref 0.0–0.5)
Eosinophils Relative: 0 %
HCT: 27.8 % — ABNORMAL LOW (ref 39.0–52.0)
Hemoglobin: 9 g/dL — ABNORMAL LOW (ref 13.0–17.0)
Immature Granulocytes: 6 %
Lymphocytes Relative: 10 %
Lymphs Abs: 1.5 10*3/uL (ref 0.7–4.0)
MCH: 28.5 pg (ref 26.0–34.0)
MCHC: 32.4 g/dL (ref 30.0–36.0)
MCV: 88 fL (ref 80.0–100.0)
Monocytes Absolute: 2 10*3/uL — ABNORMAL HIGH (ref 0.1–1.0)
Monocytes Relative: 13 %
Neutro Abs: 10.7 10*3/uL — ABNORMAL HIGH (ref 1.7–7.7)
Neutrophils Relative %: 70 %
Platelet Count: 340 10*3/uL (ref 150–400)
RBC: 3.16 MIL/uL — ABNORMAL LOW (ref 4.22–5.81)
RDW: 18.6 % — ABNORMAL HIGH (ref 11.5–15.5)
WBC Count: 15.3 10*3/uL — ABNORMAL HIGH (ref 4.0–10.5)
nRBC: 0 % (ref 0.0–0.2)

## 2019-07-01 MED ORDER — HEPARIN SOD (PORK) LOCK FLUSH 100 UNIT/ML IV SOLN
500.0000 [IU] | Freq: Once | INTRAVENOUS | Status: AC | PRN
  Administered 2019-07-01: 500 [IU]
  Filled 2019-07-01: qty 5

## 2019-07-01 MED ORDER — SODIUM CHLORIDE 0.9 % IV SOLN
Freq: Once | INTRAVENOUS | Status: AC
  Filled 2019-07-01: qty 250

## 2019-07-01 MED ORDER — SODIUM CHLORIDE 0.9% FLUSH
10.0000 mL | Freq: Once | INTRAVENOUS | Status: AC | PRN
  Administered 2019-07-01: 10 mL
  Filled 2019-07-01: qty 10

## 2019-07-01 NOTE — Patient Instructions (Signed)
Implanted Port Insertion, Care After °This sheet gives you information about how to care for yourself after your procedure. Your health care provider may also give you more specific instructions. If you have problems or questions, contact your health care provider. °What can I expect after the procedure? °After the procedure, it is common to have: °· Discomfort at the port insertion site. °· Bruising on the skin over the port. This should improve over 3-4 days. °Follow these instructions at home: °Port care °· After your port is placed, you will get a manufacturer's information card. The card has information about your port. Keep this card with you at all times. °· Take care of the port as told by your health care provider. Ask your health care provider if you or a family member can get training for taking care of the port at home. A home health care nurse may also take care of the port. °· Make sure to remember what type of port you have. °Incision care ° °  ° °· Follow instructions from your health care provider about how to take care of your port insertion site. Make sure you: °? Wash your hands with soap and water before and after you change your bandage (dressing). If soap and water are not available, use hand sanitizer. °? Change your dressing as told by your health care provider. °? Leave stitches (sutures), skin glue, or adhesive strips in place. These skin closures may need to stay in place for 2 weeks or longer. If adhesive strip edges start to loosen and curl up, you may trim the loose edges. Do not remove adhesive strips completely unless your health care provider tells you to do that. °· Check your port insertion site every day for signs of infection. Check for: °? Redness, swelling, or pain. °? Fluid or blood. °? Warmth. °? Pus or a bad smell. °Activity °· Return to your normal activities as told by your health care provider. Ask your health care provider what activities are safe for you. °· Do not  lift anything that is heavier than 10 lb (4.5 kg), or the limit that you are told, until your health care provider says that it is safe. °General instructions °· Take over-the-counter and prescription medicines only as told by your health care provider. °· Do not take baths, swim, or use a hot tub until your health care provider approves. Ask your health care provider if you may take showers. You may only be allowed to take sponge baths. °· Do not drive for 24 hours if you were given a sedative during your procedure. °· Wear a medical alert bracelet in case of an emergency. This will tell any health care providers that you have a port. °· Keep all follow-up visits as told by your health care provider. This is important. °Contact a health care provider if: °· You cannot flush your port with saline as directed, or you cannot draw blood from the port. °· You have a fever or chills. °· You have redness, swelling, or pain around your port insertion site. °· You have fluid or blood coming from your port insertion site. °· Your port insertion site feels warm to the touch. °· You have pus or a bad smell coming from the port insertion site. °Get help right away if: °· You have chest pain or shortness of breath. °· You have bleeding from your port that you cannot control. °Summary °· Take care of the port as told by your health   care provider. Keep the manufacturer's information card with you at all times. °· Change your dressing as told by your health care provider. °· Contact a health care provider if you have a fever or chills or if you have redness, swelling, or pain around your port insertion site. °· Keep all follow-up visits as told by your health care provider. °This information is not intended to replace advice given to you by your health care provider. Make sure you discuss any questions you have with your health care provider. °Document Revised: 10/24/2017 Document Reviewed: 10/24/2017 °Elsevier Patient Education ©  2020 [employer] ° °

## 2019-07-01 NOTE — Patient Instructions (Signed)

## 2019-07-01 NOTE — Progress Notes (Signed)
[place] OFFICE PROGRESS NOTE  Patient Care Team: Saguier, [name] as PCP - General (Internal Medicine) Tish Men, MD as Medical Oncologist (Oncology) Cordelia Poche, RN as Oncology Nurse Navigator  HEME/ONC OVERVIEW: 1. Classic Hodgkin lymphoma, nodular sclerosis subtype; at least Stage III -02/2019: enlarged R supraclavicular LN's (~4 and 3cm) on neck US   Enlarged cervical, bulky R supraclavicular, mediastinal, and RP adenopathy  Core R cervical LN bx non-diagnostic   Excisional LN bx showed classic Hodgkin lymphoma, nodular sclerosis subtype   FDG-avid LN's in neck, chest and abdomen; no bony abnormalities  -Late 03/2019 - present: AVD + Adcetris  Interim PET after 2 cycles showed CR (Deauville 1)   TREATMENT SUMMARY:  04/08/2019 - present: AVD + Adcetris with Onpro   ASSESSMENT & PLAN:    Stage III Classic Hodgkin lymphoma, nodular sclerosis subtype -S/p C2D1 of AVD + Adcetris; Deauville 1 after two cycles of treatment  -Patient reports minimal PO intake due to decreased appetite and nausea for the past week; BP borderline low at 93/54 -As he had excellent response on recent PET, we will delay his treatment by one week to give him a chance to recover -Plan for a total of 6 cycles of treatment  -PRN anti-emetics: Zofran, Compazine, Ativan, and the dexamethasone  Borderline hypotension -Likely multifactorial, including dehydration secondary to poor PO intake and anti-hypertensive -BP 93/56 today -I have delayed his treatment by one week, and set up the patient for daily 1L NS this week -I counseled the patient on the importance of maintaining adequate nutrition and hydration, including Ensure/Boost -Hold losartan and check BP daily; if BP < 120/80, he is instructed to hold losartan  Cough -Possibly due to losartan vs chemotherapy -No symptoms of infection -I have ordered CXR to rule out any pneumonia   Abdominal cramping -Possibly due to  chemotherapy -No abdominal pain, hematochezia or melena -I have ordered KUB to rule out any acute intra-abdominal abnormalities   Chemotherapy-associated anemia -Secondary to chemotherapy, as well as anemia of chronic disease  -Hgb 9.0 today, slightly lower than the last visit  -Patient denies any symptom of bleeding -Ttreatment as outlined above   Leukocytosis -Likely secondary to G-CSF -WBC 15.3k today, stable -Patient denies any symptoms of infection -Treatment as outlined above   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 DG Chest 2 View    Standing Status:   Future    Standing Expiration Date:   06/30/2020    Order Specific Question:   Reason for Exam (SYMPTOM  OR DIAGNOSIS REQUIRED)    Answer:   Cough    Order Specific Question:   Preferred imaging location?    Answer:   [facility] Specific Question:   Radiology Contrast Protocol - do NOT remove file path    Answer:   \\[email or web address]\DXFluoroContrastProtocols.pdf  . DG Abd 2 Views    Standing Status:   Future    Standing Expiration Date:   06/30/2020    Order Specific Question:   Reason for Exam (SYMPTOM  OR DIAGNOSIS REQUIRED)    Answer:   Abdominal cramping    Order Specific Question:   Preferred imaging location?    Answer:   [facility]    Order Specific Question:   Radiology  Contrast Protocol - do NOT remove file path    Answer:   \\[email or web address]\[email or web address]\DXFluoroContrastProtocols.pdf   All questions were answered. The patient knows to call the clinic with any problems, questions or concerns. No barriers to learning was detected.  Return in 1 week for C3D15 of chemotherapy.   Tish Men, MD 3/22/202110:39 AM  CHIEF COMPLAINT: "I am not doing  so good"  INTERVAL HISTORY: Mr. Maus returns to clinic for follow-up of Hodgkin lymphoma on AVD + Adcetris.  The patient reports that for the past 3 weeks, he has had significantly decreasing appetite and minimal p.o. intake, complicated by postprandial nausea, for which she takes Compazine with some relief.  He also has stable dry cough and mild exertional dyspnea, which is unchanged.  He denies any constitutional symptoms.  He denies any other complaints today.  REVIEW OF SYSTEMS:   Constitutional: ( - ) fevers, ( - )  chills , ( - ) night sweats Eyes: ( - ) blurriness of vision, ( - ) double vision, ( - ) watery eyes Ears, nose, mouth, throat, and face: ( - ) mucositis, ( - ) sore throat Respiratory: ( + ) cough, ( + ) dyspnea, ( - ) wheezes Cardiovascular: ( - ) palpitation, ( - ) chest discomfort, ( - ) lower extremity swelling Gastrointestinal:  ( + ) nausea, ( - ) heartburn, ( + ) change in bowel habits Skin: ( - ) abnormal skin rashes Lymphatics: ( - ) new lymphadenopathy, ( - ) easy bruising Neurological: ( + ) numbness, ( - ) tingling, ( - ) new weaknesses Behavioral/Psych: ( - ) mood change, ( - ) new changes  All other systems were reviewed with the patient and are negative.  SUMMARY OF ONCOLOGIC HISTORY: Oncology History  Hodgkin lymphoma ([place])  02/25/2019 Initial Diagnosis   Hodgkin lymphoma ([place])   02/28/2019 Imaging   CT neck: IMPRESSION: Right paratracheal enlarged lymph node and right supraclavicular enlarged lymph nodes compatible with neoplasm. No pharyngeal mass in the neck. No other adenopathy in the neck. Tissue sampling is recommended.   02/28/2019 Imaging   CT CAP: IMPRESSION: 1. Bulky right supraclavicular and mediastinal adenopathy most compatible with metastatic disease. Additionally there is porta hepatic and retroperitoneal adenopathy within the abdomen concerning for metastatic disease. 2. Mild fibrotic changes involving the lungs  bilaterally. 3. See dedicated neck CT report.   03/08/2019 Pathology Results   SURGICAL PATHOLOGY  CASE: [id number]  PATIENT: Tami [name]  Surgical Pathology Report      Clinical History: No known primary, now with right supraclavicular  lymphadenopathy, post US guided Bx. (cm)      FINAL MICROSCOPIC DIAGNOSIS:   A. LYMPH NODE, RIGHT SUPRACLAVICULAR, BIOPSY:  - Atypical lymphoid proliferation  - See comment   COMMENT:   The sections show needle core biopsy fragments of lymph nodal tissue  displaying predominance of small round to slightly irregular lymphocytes  admixed with a minor population of scattered large atypical mononuclear  and multilobated lymphoid appearing cells with variably prominent  nucleoli.  This is admixed with scattering of eosinophils in some areas.  Flow cytometric analysis was attempted but there was insufficient  material present in the sample ([place]).  Immunohistochemical  stains were performed including CD10, CD15, CD20, CD30, CD5, LCA, cyclin  D1, PAX 5, CD3 and EBV with appropriate controls.  LCA is diffusely  positive.  The small lymphoid cells show a mixture of T and B cells with  predominance of T cells.  No significant EBV, CD10 or cyclin D1  positivity is identified.  The larger atypical lymphoid cells appear to  be positive for CD30 and some for CD15 and PAX 5.  The overall findings  are very limited but atypical and worrisome for a lymphoproliferative  process particularly Hodgkin lymphoma.  Excisional biopsy is recommended  including fresh tissue for lymphoma work-up in order to further evaluate  this process    03/20/2019 Pathology Results   FINAL MICROSCOPIC DIAGNOSIS:   A. LYMPH NODE, DEEP RIGHT CERVICAL POSTERIOR TRIANGLE, EXCISION:  -Classical Hodgkin lymphoma  -See comment   COMMENT:   Sections of the lymph node show effacement of the architecture by a  vaguely nodular lymphoproliferative process characterized  by a  polymorphous cellular proliferation of small lymphocytes, eosinophils,  plasma cells in addition to variable numbers of large atypical  mononuclear and multi-lobated lymphoid appearing cells with variably  prominent nucleoli characteristic of Reed-Sternberg cells and variants  including lacunar cells.  Areas of early fibrosis surrounding some of  the nodules are also present.  Flow cytometric analysis was performed  ([id number]) and shows predominance of T lymphocytes with nonspecific  changes in addition to a minor polyclonal B-cell population with no  abnormal phenotype. Immunohistochemical stains were performed including  CD20, CD3, PAX 5, CD15, CD30, LCA and in situ hybridization for EBV with  appropriate controls.  The large atypical lymphoid appearing cells are  positive for CD15, CD30 and weakly for PAX 5 and negative for CD3, CD20,  LCA and EBV.  The small lymphocytes in the background show a mixture of  T and B-cells with predominance of T-cells. The morphologic and  immunophenotypic features are consistent with classical Hodgkin lymphoma  which is best subclassified as nodular sclerosis type.    04/03/2019 Imaging   PET: IMPRESSION: 1. Hypermetabolic lymphadenopathy involving the neck, chest and abdomen consistent with known Hodgkin's lymphoma. Deauville 5. 2. No findings for osseous metastatic disease.   04/03/2019 Cancer Staging   Staging form: Hodgkin and Non-Hodgkin Lymphoma, AJCC 8th Edition - Clinical stage from 04/03/2019: Stage III (Hodgkin lymphoma) - Signed by Tish Men, MD on 04/08/2019   04/08/2019 -  Chemotherapy   The patient had DOXOrubicin (ADRIAMYCIN) chemo injection 50 mg, 25 mg/m2 = 50 mg, Intravenous,  Once, 3 of 6 cycles Administration: 50 mg (04/08/2019), 50 mg (04/22/2019), 50 mg (05/06/2019), 50 mg (06/03/2019), 50 mg (06/17/2019) palonosetron (ALOXI) injection 0.25 mg, 0.25 mg, Intravenous,  Once, 3 of 6 cycles Administration: 0.25 mg (04/08/2019),  0.25 mg (04/22/2019), 0.25 mg (05/06/2019), 0.25 mg (06/03/2019), 0.25 mg (06/17/2019) pegfilgrastim (NEULASTA ONPRO KIT) injection 6 mg, 6 mg, Subcutaneous, Once, 3 of 6 cycles Administration: 6 mg (04/08/2019), 6 mg (04/22/2019), 6 mg (05/06/2019), 6 mg (06/17/2019), 6 mg (06/03/2019) dacarbazine (DTIC) 750 mg in sodium chloride 0.9 % 250 mL chemo infusion, 375 mg/m2 = 750 mg, Intravenous,  Once, 3 of 6 cycles Administration: 750 mg (04/08/2019), 750 mg (04/22/2019), 750 mg (05/06/2019), 750 mg (06/03/2019), 750 mg (06/17/2019) fosaprepitant (EMEND) 150 mg, dexamethasone (DECADRON) 10 mg in sodium chloride 0.9 % 145 mL IVPB, 150 mg, Intravenous,  Once, 3 of 6 cycles Administration: 150 mg (04/08/2019), 150 mg (04/22/2019), 150 mg (05/06/2019), 150 mg (06/03/2019), 150 mg (06/17/2019) brentuximab vedotin (ADCETRIS) 100 mg in sodium chloride 0.9 % 100 mL chemo infusion, 1.15 mg/kg = 105 mg, Intravenous,  Once, 3 of 6 cycles Administration: 100 mg (04/08/2019), 100 mg (04/22/2019), 100 mg (05/06/2019), 100 mg (06/03/2019), 100 mg (06/17/2019) vinBLAStine (VELBAN)  12.1 mg in sodium chloride 0.9 % 50 mL chemo infusion, 6 mg/m2 = 12.1 mg, Intravenous, Once, 3 of 6 cycles Administration: 12.1 mg (04/08/2019), 12 mg (04/22/2019), 12.1 mg (05/06/2019), 12.1 mg (06/03/2019), 12.1 mg (06/17/2019)  for chemotherapy treatment.      I have reviewed the past medical history, past surgical history, social history and family history with the patient and they are unchanged from previous note.  ALLERGIES:  has No Known Allergies.  MEDICATIONS:  Current Outpatient Medications  Medication Sig Dispense Refill  . atorvastatin (LIPITOR) 10 MG tablet TAKE 1 TABLET BY MOUTH EVERY DAY (Patient taking differently: Take 10 mg by mouth daily. ) 90 tablet 1  . dexamethasone (DECADRON) 4 MG tablet TAKE 2 TABLETS BY MOUTH ONCE A DAY FOR 3 DAYS AFTER CHEMO. TAKE WITH FOOD. 30 tablet 1  . ferrous sulfate 325 (65 FE) MG tablet Take 1 tablet (325 mg total)  by mouth daily with breakfast. 30 tablet 1  . guaiFENesin-dextromethorphan (ROBITUSSIN DM) 100-10 MG/5ML syrup Take 5 mLs by mouth every 4 (four) hours as needed for cough. 118 mL 0  . lidocaine-prilocaine (EMLA) cream Apply to affected area once (Patient taking differently: Apply 1 application topically daily as needed. Port. Apply to affected area once) 30 g 3  . LORazepam (ATIVAN) 0.5 MG tablet TAKE 1 TABLET (0.5 MG TOTAL) BY MOUTH EVERY 6 (SIX) HOURS AS NEEDED (NAUSEA OR VOMITING). 30 tablet 0  . losartan (COZAAR) 100 MG tablet Take 100 mg by mouth daily.    . magnesium oxide (MAG-OX) 400 MG tablet Take by mouth 2 (two) times daily.    . metFORMIN (GLUCOPHAGE) 500 MG tablet TAKE 1 TABLET BY MOUTH TWICE A DAY WITH MEALS (Patient taking differently: Take 500 mg by mouth 2 (two) times daily with a meal. ) 180 tablet 1  . ondansetron (ZOFRAN) 8 MG tablet TAKE 1 TAB BY MOUTH 2 (TWO) TIMES DAILY AS NEEDED. START ON THE THIRD DAY AFTER CHEMOTHERAPY. 30 tablet 1  . prochlorperazine (COMPAZINE) 10 MG tablet Take 1 tablet (10 mg total) by mouth every 6 (six) hours as needed (Nausea or vomiting). 30 tablet 1  . sildenafil (VIAGRA) 100 MG tablet Take 0.5-1 tablets (50-100 mg total) by mouth daily as needed for erectile dysfunction. 10 tablet 1   No current facility-administered medications for this visit.    PHYSICAL EXAMINATION: ECOG PERFORMANCE STATUS: 1 - Symptomatic but completely ambulatory  Today's Vitals   07/01/19 1020  BP: (!) 93/56  Pulse: 80  Resp: 18  Temp: (!) 97.1 F (36.2 C)  TempSrc: Temporal  SpO2: 97%  Weight: 164 lb 12.8 oz (74.8 kg)  Height: 5' 6"  (1.676 m)  PainSc: 0-No pain   Body mass index is 26.6 kg/m.  Filed Weights   07/01/19 1020  Weight: 164 lb 12.8 oz (74.8 kg)    GENERAL: alert, no distress, slightly pale appearing  SKIN: skin color, texture, turgor are normal, no rashes or significant lesions EYES: conjunctiva are pink and non-injected, sclera  clear OROPHARYNX: no exudate, no erythema; lips, buccal mucosa, and tongue normal  NECK: supple, non-tender LYMPH:  no palpable lymphadenopathy in the cervical LUNGS: clear to auscultation with normal breathing effort HEART: regular rate & rhythm and no murmurs and no lower extremity edema ABDOMEN: soft, non-tender, non-distended, normal bowel sounds Musculoskeletal: no cyanosis of digits and no clubbing  PSYCH: alert & oriented x 3, fluent speech  LABORATORY DATA:  I have reviewed the data as listed  Component Value Date/Time   NA 139 06/17/2019 1103   K 4.1 06/17/2019 1103   CL 103 06/17/2019 1103   CO2 27 06/17/2019 1103   GLUCOSE 190 (H) 06/17/2019 1103   BUN 7 (L) 06/17/2019 1103   CREATININE 0.78 06/17/2019 1103   CALCIUM 9.3 06/17/2019 1103   PROT 6.8 06/17/2019 1103   ALBUMIN 3.6 06/17/2019 1103   AST 10 (L) 06/17/2019 1103   ALT 11 06/17/2019 1103   ALKPHOS 55 06/17/2019 1103   BILITOT 0.5 06/17/2019 1103   GFRNONAA >60 06/17/2019 1103   GFRAA >60 06/17/2019 1103    No results found for: SPEP, UPEP  Lab Results  Component Value Date   WBC 15.3 (H) 07/01/2019   NEUTROABS 10.7 (H) 07/01/2019   HGB 9.0 (L) 07/01/2019   HCT 27.8 (L) 07/01/2019   MCV 88.0 07/01/2019   PLT 340 07/01/2019      Chemistry      Component Value Date/Time   NA 139 06/17/2019 1103   K 4.1 06/17/2019 1103   CL 103 06/17/2019 1103   CO2 27 06/17/2019 1103   BUN 7 (L) 06/17/2019 1103   CREATININE 0.78 06/17/2019 1103      Component Value Date/Time   CALCIUM 9.3 06/17/2019 1103   ALKPHOS 55 06/17/2019 1103   AST 10 (L) 06/17/2019 1103   ALT 11 06/17/2019 1103   BILITOT 0.5 06/17/2019 1103       RADIOGRAPHIC STUDIES: I have personally reviewed the radiological images as listed below and agreed with the findings in the report. NM PET Image Restag (PS) Skull Base To Thigh  Result Date: 06/14/2019 CLINICAL DATA:  Subsequent treatment strategy for Hodgkin's lymphoma. EXAM:  NUCLEAR MEDICINE PET SKULL BASE TO THIGH TECHNIQUE: 9.03 mCi F-18 FDG was injected intravenously. Full-ring PET imaging was performed from the skull base to thigh after the radiotracer. CT data was obtained and used for attenuation correction and anatomic localization. Fasting blood glucose: 170 mg/dl COMPARISON:  PET-CT 04/03/2019. FINDINGS: Mediastinal blood pool activity: SUV max 2.1 Liver activity: SUV max 3.2 NECK: No hypermetabolic lymph nodes in the neck. In addition, previously noted right supraclavicular lymph node has significantly decreased in size, currently measuring 1.2 cm in short axis (axial image 54 of series 4) and is no longer hypermetabolic (SUVmax = 1.7 versus 12.3 on the prior)) . Incidental CT findings: none CHEST: No hypermetabolic mediastinal or hilar nodes. Previously noted enlarged right paratracheal lymph node has decreased in size and currently measures 2.3 cm in short axis (axial image 65 of series 4) and is no longer hypermetabolic (SUVmax = 2.2 versus 11.7 on the prior)) . No suspicious pulmonary nodules on the CT scan. Incidental CT findings: Left internal jugular single-lumen porta cath with tip terminating in the superior aspect of the right atrium. Aortic atherosclerosis. ABDOMEN/PELVIS: No abnormal hypermetabolic activity within the liver, pancreas, adrenal glands, or spleen. No hypermetabolic lymph nodes in the abdomen or pelvis. Incidental CT findings: none SKELETON: Mild diffuse hypermetabolism throughout the visualized skeletal structures without destructive osseous lesions, most likely to reflect rebound marrow response. No focal hypermetabolic activity to suggest skeletal metastasis. Bilateral hip arthroplasties. Incidental CT findings: none IMPRESSION: 1. Complete metabolic response to therapy (Deauville 1), as above. 2. Additional incidental findings, as above. Electronically Signed   By: Vinnie Langton M.D.   On: 06/14/2019 11:00

## 2019-07-02 ENCOUNTER — Encounter: Payer: Self-pay | Admitting: *Deleted

## 2019-07-02 ENCOUNTER — Inpatient Hospital Stay: Payer: BC Managed Care – PPO

## 2019-07-02 DIAGNOSIS — R110 Nausea: Secondary | ICD-10-CM

## 2019-07-02 DIAGNOSIS — C8118 Nodular sclerosis classical Hodgkin lymphoma, lymph nodes of multiple sites: Secondary | ICD-10-CM | POA: Diagnosis not present

## 2019-07-02 MED ORDER — SODIUM CHLORIDE 0.9 % IV SOLN: Freq: Once | INTRAVENOUS | Status: DC

## 2019-07-02 MED ORDER — ONDANSETRON HCL 4 MG/2ML IJ SOLN
INTRAMUSCULAR | Status: AC
  Filled 2019-07-02: qty 4

## 2019-07-02 MED ORDER — SODIUM CHLORIDE 0.9 % IV SOLN
Freq: Once | INTRAVENOUS | Status: AC
  Filled 2019-07-02: qty 250

## 2019-07-02 MED ORDER — SODIUM CHLORIDE 0.9% FLUSH
10.0000 mL | Freq: Once | INTRAVENOUS | Status: AC | PRN
  Administered 2019-07-02: 10 mL
  Filled 2019-07-02: qty 10

## 2019-07-02 MED ORDER — HEPARIN SOD (PORK) LOCK FLUSH 100 UNIT/ML IV SOLN
500.0000 [IU] | Freq: Once | INTRAVENOUS | Status: AC | PRN
  Administered 2019-07-02: 500 [IU]
  Filled 2019-07-02: qty 5

## 2019-07-02 MED ORDER — ONDANSETRON HCL 4 MG/2ML IJ SOLN
8.0000 mg | Freq: Once | INTRAMUSCULAR | Status: AC
  Administered 2019-07-02: 8 mg via INTRAVENOUS

## 2019-07-02 NOTE — Patient Instructions (Signed)

## 2019-07-03 ENCOUNTER — Other Ambulatory Visit: Payer: Self-pay

## 2019-07-03 ENCOUNTER — Inpatient Hospital Stay: Payer: BC Managed Care – PPO

## 2019-07-03 VITALS — BP 93/55 | HR 64 | Temp 97.1°F | Resp 18

## 2019-07-03 DIAGNOSIS — C8118 Nodular sclerosis classical Hodgkin lymphoma, lymph nodes of multiple sites: Secondary | ICD-10-CM

## 2019-07-03 MED ORDER — SODIUM CHLORIDE 0.9 % IV SOLN
Freq: Once | INTRAVENOUS | Status: AC
  Filled 2019-07-03: qty 250

## 2019-07-03 MED ORDER — SODIUM CHLORIDE 0.9% FLUSH
10.0000 mL | Freq: Once | INTRAVENOUS | Status: AC | PRN
  Administered 2019-07-03: 10 mL
  Filled 2019-07-03: qty 10

## 2019-07-03 MED ORDER — HEPARIN SOD (PORK) LOCK FLUSH 100 UNIT/ML IV SOLN
500.0000 [IU] | Freq: Once | INTRAVENOUS | Status: AC | PRN
  Administered 2019-07-03: 500 [IU]
  Filled 2019-07-03: qty 5

## 2019-07-03 NOTE — Patient Instructions (Signed)

## 2019-07-04 ENCOUNTER — Inpatient Hospital Stay: Payer: BC Managed Care – PPO

## 2019-07-04 VITALS — BP 110/68 | HR 89 | Temp 97.1°F | Resp 18

## 2019-07-04 DIAGNOSIS — C8118 Nodular sclerosis classical Hodgkin lymphoma, lymph nodes of multiple sites: Secondary | ICD-10-CM | POA: Diagnosis not present

## 2019-07-04 MED ORDER — SODIUM CHLORIDE 0.9 % IV SOLN
Freq: Once | INTRAVENOUS | Status: AC
  Filled 2019-07-04: qty 250

## 2019-07-04 MED ORDER — HEPARIN SOD (PORK) LOCK FLUSH 100 UNIT/ML IV SOLN
500.0000 [IU] | Freq: Once | INTRAVENOUS | Status: AC | PRN
  Administered 2019-07-04: 500 [IU]
  Filled 2019-07-04: qty 5

## 2019-07-04 MED ORDER — SODIUM CHLORIDE 0.9% FLUSH
10.0000 mL | Freq: Once | INTRAVENOUS | Status: AC | PRN
  Administered 2019-07-04: 10 mL
  Filled 2019-07-04: qty 10

## 2019-07-04 NOTE — Patient Instructions (Signed)

## 2019-07-05 ENCOUNTER — Inpatient Hospital Stay: Payer: BC Managed Care – PPO

## 2019-07-08 ENCOUNTER — Inpatient Hospital Stay: Payer: BC Managed Care – PPO

## 2019-07-08 ENCOUNTER — Inpatient Hospital Stay (HOSPITAL_COMMUNITY)
Admission: EM | Admit: 2019-07-08 | Discharge: 2019-08-28 | DRG: 004 | Disposition: A | Discharge status: Other Acute Inpatient Hospital | Payer: BC Managed Care – PPO | Attending: Pulmonary Disease | Admitting: Pulmonary Disease

## 2019-07-08 ENCOUNTER — Encounter (HOSPITAL_COMMUNITY): Payer: Self-pay | Admitting: Emergency Medicine

## 2019-07-08 ENCOUNTER — Inpatient Hospital Stay: Payer: BC Managed Care – PPO | Admitting: Hematology

## 2019-07-08 ENCOUNTER — Inpatient Hospital Stay (HOSPITAL_COMMUNITY): Payer: BC Managed Care – PPO

## 2019-07-08 ENCOUNTER — Other Ambulatory Visit: Payer: Self-pay

## 2019-07-08 ENCOUNTER — Telehealth: Payer: Self-pay | Admitting: *Deleted

## 2019-07-08 ENCOUNTER — Emergency Department (HOSPITAL_COMMUNITY): Payer: BC Managed Care – PPO

## 2019-07-08 ENCOUNTER — Other Ambulatory Visit: Payer: Self-pay | Admitting: Oncology

## 2019-07-08 DIAGNOSIS — Y92230 Patient room in hospital as the place of occurrence of the external cause: Secondary | ICD-10-CM | POA: Diagnosis not present

## 2019-07-08 DIAGNOSIS — J982 Interstitial emphysema: Secondary | ICD-10-CM

## 2019-07-08 DIAGNOSIS — E876 Hypokalemia: Secondary | ICD-10-CM | POA: Diagnosis not present

## 2019-07-08 DIAGNOSIS — Z8616 Personal history of COVID-19: Secondary | ICD-10-CM

## 2019-07-08 DIAGNOSIS — J9600 Acute respiratory failure, unspecified whether with hypoxia or hypercapnia: Secondary | ICD-10-CM | POA: Diagnosis not present

## 2019-07-08 DIAGNOSIS — Z8701 Personal history of pneumonia (recurrent): Secondary | ICD-10-CM | POA: Diagnosis not present

## 2019-07-08 DIAGNOSIS — Z79899 Other long term (current) drug therapy: Secondary | ICD-10-CM | POA: Diagnosis not present

## 2019-07-08 DIAGNOSIS — E873 Alkalosis: Secondary | ICD-10-CM | POA: Diagnosis not present

## 2019-07-08 DIAGNOSIS — D701 Agranulocytosis secondary to cancer chemotherapy: Secondary | ICD-10-CM | POA: Diagnosis not present

## 2019-07-08 DIAGNOSIS — J948 Other specified pleural conditions: Secondary | ICD-10-CM | POA: Diagnosis not present

## 2019-07-08 DIAGNOSIS — E43 Unspecified severe protein-calorie malnutrition: Secondary | ICD-10-CM | POA: Diagnosis not present

## 2019-07-08 DIAGNOSIS — C8190 Hodgkin lymphoma, unspecified, unspecified site: Secondary | ICD-10-CM | POA: Diagnosis present

## 2019-07-08 DIAGNOSIS — J939 Pneumothorax, unspecified: Secondary | ICD-10-CM | POA: Diagnosis not present

## 2019-07-08 DIAGNOSIS — B371 Pulmonary candidiasis: Secondary | ICD-10-CM | POA: Diagnosis not present

## 2019-07-08 DIAGNOSIS — E440 Moderate protein-calorie malnutrition: Secondary | ICD-10-CM | POA: Diagnosis not present

## 2019-07-08 DIAGNOSIS — E875 Hyperkalemia: Secondary | ICD-10-CM | POA: Diagnosis not present

## 2019-07-08 DIAGNOSIS — Z930 Tracheostomy status: Secondary | ICD-10-CM | POA: Diagnosis not present

## 2019-07-08 DIAGNOSIS — R0902 Hypoxemia: Secondary | ICD-10-CM

## 2019-07-08 DIAGNOSIS — K529 Noninfective gastroenteritis and colitis, unspecified: Secondary | ICD-10-CM | POA: Diagnosis not present

## 2019-07-08 DIAGNOSIS — C8590 Non-Hodgkin lymphoma, unspecified, unspecified site: Secondary | ICD-10-CM

## 2019-07-08 DIAGNOSIS — D638 Anemia in other chronic diseases classified elsewhere: Secondary | ICD-10-CM | POA: Diagnosis present

## 2019-07-08 DIAGNOSIS — G9341 Metabolic encephalopathy: Secondary | ICD-10-CM | POA: Diagnosis not present

## 2019-07-08 DIAGNOSIS — J9602 Acute respiratory failure with hypercapnia: Secondary | ICD-10-CM | POA: Diagnosis not present

## 2019-07-08 DIAGNOSIS — Z978 Presence of other specified devices: Secondary | ICD-10-CM

## 2019-07-08 DIAGNOSIS — R093 Abnormal sputum: Secondary | ICD-10-CM | POA: Diagnosis not present

## 2019-07-08 DIAGNOSIS — B59 Pneumocystosis: Principal | ICD-10-CM | POA: Diagnosis present

## 2019-07-08 DIAGNOSIS — T368X5A Adverse effect of other systemic antibiotics, initial encounter: Secondary | ICD-10-CM | POA: Diagnosis not present

## 2019-07-08 DIAGNOSIS — Z95828 Presence of other vascular implants and grafts: Secondary | ICD-10-CM | POA: Diagnosis not present

## 2019-07-08 DIAGNOSIS — R1312 Dysphagia, oropharyngeal phase: Secondary | ICD-10-CM

## 2019-07-08 DIAGNOSIS — C8111 Nodular sclerosis classical Hodgkin lymphoma, lymph nodes of head, face, and neck: Secondary | ICD-10-CM | POA: Diagnosis present

## 2019-07-08 DIAGNOSIS — R002 Palpitations: Secondary | ICD-10-CM | POA: Diagnosis not present

## 2019-07-08 DIAGNOSIS — E2740 Unspecified adrenocortical insufficiency: Secondary | ICD-10-CM | POA: Diagnosis not present

## 2019-07-08 DIAGNOSIS — E119 Type 2 diabetes mellitus without complications: Secondary | ICD-10-CM

## 2019-07-08 DIAGNOSIS — Z9689 Presence of other specified functional implants: Secondary | ICD-10-CM | POA: Diagnosis not present

## 2019-07-08 DIAGNOSIS — T451X5A Adverse effect of antineoplastic and immunosuppressive drugs, initial encounter: Secondary | ICD-10-CM | POA: Diagnosis not present

## 2019-07-08 DIAGNOSIS — R0602 Shortness of breath: Secondary | ICD-10-CM | POA: Diagnosis not present

## 2019-07-08 DIAGNOSIS — E870 Hyperosmolality and hypernatremia: Secondary | ICD-10-CM | POA: Diagnosis not present

## 2019-07-08 DIAGNOSIS — J9601 Acute respiratory failure with hypoxia: Secondary | ICD-10-CM

## 2019-07-08 DIAGNOSIS — R5383 Other fatigue: Secondary | ICD-10-CM | POA: Diagnosis not present

## 2019-07-08 DIAGNOSIS — R05 Cough: Secondary | ICD-10-CM | POA: Diagnosis not present

## 2019-07-08 DIAGNOSIS — E1165 Type 2 diabetes mellitus with hyperglycemia: Secondary | ICD-10-CM | POA: Diagnosis not present

## 2019-07-08 DIAGNOSIS — J189 Pneumonia, unspecified organism: Secondary | ICD-10-CM

## 2019-07-08 DIAGNOSIS — Z7984 Long term (current) use of oral hypoglycemic drugs: Secondary | ICD-10-CM

## 2019-07-08 DIAGNOSIS — Z6826 Body mass index (BMI) 26.0-26.9, adult: Secondary | ICD-10-CM

## 2019-07-08 DIAGNOSIS — T17908S Unspecified foreign body in respiratory tract, part unspecified causing other injury, sequela: Secondary | ICD-10-CM

## 2019-07-08 DIAGNOSIS — R0689 Other abnormalities of breathing: Secondary | ICD-10-CM | POA: Diagnosis not present

## 2019-07-08 DIAGNOSIS — U071 COVID-19: Secondary | ICD-10-CM

## 2019-07-08 DIAGNOSIS — R64 Cachexia: Secondary | ICD-10-CM | POA: Diagnosis not present

## 2019-07-08 DIAGNOSIS — B370 Candidal stomatitis: Secondary | ICD-10-CM

## 2019-07-08 DIAGNOSIS — C8118 Nodular sclerosis classical Hodgkin lymphoma, lymph nodes of multiple sites: Secondary | ICD-10-CM | POA: Diagnosis not present

## 2019-07-08 DIAGNOSIS — Z515 Encounter for palliative care: Secondary | ICD-10-CM | POA: Diagnosis not present

## 2019-07-08 DIAGNOSIS — Z7189 Other specified counseling: Secondary | ICD-10-CM | POA: Diagnosis not present

## 2019-07-08 DIAGNOSIS — R579 Shock, unspecified: Secondary | ICD-10-CM | POA: Diagnosis not present

## 2019-07-08 DIAGNOSIS — T4275XA Adverse effect of unspecified antiepileptic and sedative-hypnotic drugs, initial encounter: Secondary | ICD-10-CM | POA: Diagnosis not present

## 2019-07-08 DIAGNOSIS — E785 Hyperlipidemia, unspecified: Secondary | ICD-10-CM | POA: Diagnosis not present

## 2019-07-08 DIAGNOSIS — L89152 Pressure ulcer of sacral region, stage 2: Secondary | ICD-10-CM | POA: Diagnosis not present

## 2019-07-08 DIAGNOSIS — R5381 Other malaise: Secondary | ICD-10-CM | POA: Diagnosis not present

## 2019-07-08 DIAGNOSIS — E872 Acidosis: Secondary | ICD-10-CM | POA: Diagnosis not present

## 2019-07-08 DIAGNOSIS — Z9225 Personal history of immunosupression therapy: Secondary | ICD-10-CM | POA: Diagnosis not present

## 2019-07-08 DIAGNOSIS — D696 Thrombocytopenia, unspecified: Secondary | ICD-10-CM | POA: Diagnosis not present

## 2019-07-08 DIAGNOSIS — C8100 Nodular lymphocyte predominant Hodgkin lymphoma, unspecified site: Secondary | ICD-10-CM | POA: Diagnosis not present

## 2019-07-08 DIAGNOSIS — Z20822 Contact with and (suspected) exposure to covid-19: Secondary | ICD-10-CM | POA: Diagnosis not present

## 2019-07-08 DIAGNOSIS — J80 Acute respiratory distress syndrome: Secondary | ICD-10-CM | POA: Diagnosis not present

## 2019-07-08 DIAGNOSIS — Z9911 Dependence on respirator [ventilator] status: Secondary | ICD-10-CM | POA: Diagnosis not present

## 2019-07-08 DIAGNOSIS — I10 Essential (primary) hypertension: Secondary | ICD-10-CM | POA: Diagnosis present

## 2019-07-08 DIAGNOSIS — R918 Other nonspecific abnormal finding of lung field: Secondary | ICD-10-CM

## 2019-07-08 DIAGNOSIS — Z0189 Encounter for other specified special examinations: Secondary | ICD-10-CM

## 2019-07-08 DIAGNOSIS — Z8249 Family history of ischemic heart disease and other diseases of the circulatory system: Secondary | ICD-10-CM

## 2019-07-08 DIAGNOSIS — Z4682 Encounter for fitting and adjustment of non-vascular catheter: Secondary | ICD-10-CM

## 2019-07-08 DIAGNOSIS — B948 Sequelae of other specified infectious and parasitic diseases: Secondary | ICD-10-CM

## 2019-07-08 DIAGNOSIS — T797XXA Traumatic subcutaneous emphysema, initial encounter: Secondary | ICD-10-CM

## 2019-07-08 DIAGNOSIS — B379 Candidiasis, unspecified: Secondary | ICD-10-CM | POA: Diagnosis not present

## 2019-07-08 DIAGNOSIS — J9621 Acute and chronic respiratory failure with hypoxia: Secondary | ICD-10-CM | POA: Diagnosis not present

## 2019-07-08 DIAGNOSIS — J9312 Secondary spontaneous pneumothorax: Secondary | ICD-10-CM | POA: Diagnosis not present

## 2019-07-08 DIAGNOSIS — E11649 Type 2 diabetes mellitus with hypoglycemia without coma: Secondary | ICD-10-CM | POA: Diagnosis not present

## 2019-07-08 DIAGNOSIS — T370X5A Adverse effect of sulfonamides, initial encounter: Secondary | ICD-10-CM | POA: Diagnosis not present

## 2019-07-08 DIAGNOSIS — D849 Immunodeficiency, unspecified: Secondary | ICD-10-CM | POA: Diagnosis not present

## 2019-07-08 DIAGNOSIS — T380X5A Adverse effect of glucocorticoids and synthetic analogues, initial encounter: Secondary | ICD-10-CM | POA: Diagnosis not present

## 2019-07-08 DIAGNOSIS — G4733 Obstructive sleep apnea (adult) (pediatric): Secondary | ICD-10-CM | POA: Diagnosis present

## 2019-07-08 DIAGNOSIS — J930 Spontaneous tension pneumothorax: Secondary | ICD-10-CM | POA: Diagnosis not present

## 2019-07-08 DIAGNOSIS — L8990 Pressure ulcer of unspecified site, unspecified stage: Secondary | ICD-10-CM | POA: Insufficient documentation

## 2019-07-08 DIAGNOSIS — J9620 Acute and chronic respiratory failure, unspecified whether with hypoxia or hypercapnia: Secondary | ICD-10-CM | POA: Diagnosis not present

## 2019-07-08 DIAGNOSIS — R069 Unspecified abnormalities of breathing: Secondary | ICD-10-CM

## 2019-07-08 DIAGNOSIS — R1310 Dysphagia, unspecified: Secondary | ICD-10-CM

## 2019-07-08 DIAGNOSIS — J9383 Other pneumothorax: Secondary | ICD-10-CM | POA: Diagnosis not present

## 2019-07-08 DIAGNOSIS — Z9981 Dependence on supplemental oxygen: Secondary | ICD-10-CM

## 2019-07-08 DIAGNOSIS — J9690 Respiratory failure, unspecified, unspecified whether with hypoxia or hypercapnia: Secondary | ICD-10-CM

## 2019-07-08 DIAGNOSIS — R239 Unspecified skin changes: Secondary | ICD-10-CM

## 2019-07-08 DIAGNOSIS — D500 Iron deficiency anemia secondary to blood loss (chronic): Secondary | ICD-10-CM | POA: Diagnosis present

## 2019-07-08 DIAGNOSIS — R54 Age-related physical debility: Secondary | ICD-10-CM | POA: Diagnosis not present

## 2019-07-08 DIAGNOSIS — Z4659 Encounter for fitting and adjustment of other gastrointestinal appliance and device: Secondary | ICD-10-CM

## 2019-07-08 DIAGNOSIS — C8110 Nodular sclerosis classical Hodgkin lymphoma, unspecified site: Secondary | ICD-10-CM

## 2019-07-08 DIAGNOSIS — D649 Anemia, unspecified: Secondary | ICD-10-CM | POA: Diagnosis not present

## 2019-07-08 DIAGNOSIS — D6481 Anemia due to antineoplastic chemotherapy: Secondary | ICD-10-CM | POA: Diagnosis present

## 2019-07-08 DIAGNOSIS — R739 Hyperglycemia, unspecified: Secondary | ICD-10-CM | POA: Diagnosis not present

## 2019-07-08 HISTORY — DX: Pneumonia, unspecified organism: J18.9

## 2019-07-08 LAB — CBC WITH DIFFERENTIAL/PLATELET
Abs Immature Granulocytes: 0.76 10*3/uL — ABNORMAL HIGH (ref 0.00–0.07)
Basophils Absolute: 0.2 10*3/uL — ABNORMAL HIGH (ref 0.0–0.1)
Basophils Relative: 1 %
Eosinophils Absolute: 0 10*3/uL (ref 0.0–0.5)
Eosinophils Relative: 0 %
HCT: 28.6 % — ABNORMAL LOW (ref 39.0–52.0)
Hemoglobin: 8.9 g/dL — ABNORMAL LOW (ref 13.0–17.0)
Immature Granulocytes: 2 %
Lymphocytes Relative: 5 %
Lymphs Abs: 1.7 10*3/uL (ref 0.7–4.0)
MCH: 28.4 pg (ref 26.0–34.0)
MCHC: 31.1 g/dL (ref 30.0–36.0)
MCV: 91.4 fL (ref 80.0–100.0)
Monocytes Absolute: 1.5 10*3/uL — ABNORMAL HIGH (ref 0.1–1.0)
Monocytes Relative: 4 %
Neutro Abs: 33.4 10*3/uL — ABNORMAL HIGH (ref 1.7–7.7)
Neutrophils Relative %: 88 %
Platelets: 283 10*3/uL (ref 150–400)
RBC: 3.13 MIL/uL — ABNORMAL LOW (ref 4.22–5.81)
RDW: 20 % — ABNORMAL HIGH (ref 11.5–15.5)
WBC: 37.6 10*3/uL — ABNORMAL HIGH (ref 4.0–10.5)
nRBC: 0.2 % (ref 0.0–0.2)

## 2019-07-08 LAB — COMPREHENSIVE METABOLIC PANEL
ALT: 12 U/L (ref 0–44)
AST: 25 U/L (ref 15–41)
Albumin: 2.4 g/dL — ABNORMAL LOW (ref 3.5–5.0)
Alkaline Phosphatase: 85 U/L (ref 38–126)
Anion gap: 12 (ref 5–15)
BUN: 22 mg/dL (ref 8–23)
CO2: 27 mmol/L (ref 22–32)
Calcium: 8.4 mg/dL — ABNORMAL LOW (ref 8.9–10.3)
Chloride: 104 mmol/L (ref 98–111)
Creatinine, Ser: 0.92 mg/dL (ref 0.61–1.24)
GFR calc Af Amer: >60 mL/min (ref >60)
GFR calc non Af Amer: >60 mL/min (ref >60)
Glucose, Bld: 185 mg/dL — ABNORMAL HIGH (ref 70–99)
Potassium: 3.9 mmol/L (ref 3.5–5.1)
Sodium: 143 mmol/L (ref 135–145)
Total Bilirubin: 1.1 mg/dL (ref 0.3–1.2)
Total Protein: 7.3 g/dL (ref 6.5–8.1)

## 2019-07-08 LAB — CBC
HCT: 26.2 % — ABNORMAL LOW (ref 39.0–52.0)
Hemoglobin: 8.2 g/dL — ABNORMAL LOW (ref 13.0–17.0)
MCH: 28.9 pg (ref 26.0–34.0)
MCHC: 31.3 g/dL (ref 30.0–36.0)
MCV: 92.3 fL (ref 80.0–100.0)
Platelets: 242 10*3/uL (ref 150–400)
RBC: 2.84 MIL/uL — ABNORMAL LOW (ref 4.22–5.81)
RDW: 20.1 % — ABNORMAL HIGH (ref 11.5–15.5)
WBC: 33.8 10*3/uL — ABNORMAL HIGH (ref 4.0–10.5)
nRBC: 0.3 % — ABNORMAL HIGH (ref 0.0–0.2)

## 2019-07-08 LAB — BLOOD GAS, ARTERIAL
Acid-Base Excess: 3.5 mmol/L — ABNORMAL HIGH (ref 0.0–2.0)
Bicarbonate: 26.8 mmol/L (ref 20.0–28.0)
Drawn by: 336832
FIO2: 100
O2 Content: 80 L/min
O2 Saturation: 91.2 %
Patient temperature: 98.5
pCO2 arterial: 37.1 mmHg (ref 32.0–48.0)
pH, Arterial: 7.473 — ABNORMAL HIGH (ref 7.350–7.450)
pO2, Arterial: 64.5 mmHg — ABNORMAL LOW (ref 83.0–108.0)

## 2019-07-08 LAB — I-STAT CHEM 8, ED
BUN: 19 mg/dL (ref 8–23)
Calcium, Ion: 1.09 mmol/L — ABNORMAL LOW (ref 1.15–1.40)
Chloride: 102 mmol/L (ref 98–111)
Creatinine, Ser: 0.8 mg/dL (ref 0.61–1.24)
Glucose, Bld: 177 mg/dL — ABNORMAL HIGH (ref 70–99)
HCT: 28 % — ABNORMAL LOW (ref 39.0–52.0)
Hemoglobin: 9.5 g/dL — ABNORMAL LOW (ref 13.0–17.0)
Potassium: 3.7 mmol/L (ref 3.5–5.1)
Sodium: 140 mmol/L (ref 135–145)
TCO2: 28 mmol/L (ref 22–32)

## 2019-07-08 LAB — URINALYSIS, ROUTINE W REFLEX MICROSCOPIC
Bacteria, UA: NONE SEEN
Bilirubin Urine: NEGATIVE
Glucose, UA: NEGATIVE mg/dL
Hgb urine dipstick: NEGATIVE
Ketones, ur: NEGATIVE mg/dL
Leukocytes,Ua: NEGATIVE
Nitrite: NEGATIVE
Protein, ur: 30 mg/dL — AB
Specific Gravity, Urine: 1.025 (ref 1.005–1.030)
pH: 5 (ref 5.0–8.0)

## 2019-07-08 LAB — D-DIMER, QUANTITATIVE: D-Dimer, Quant: >20 ug/mL-FEU — ABNORMAL HIGH (ref 0.00–0.50)

## 2019-07-08 LAB — MRSA PCR SCREENING: MRSA by PCR: NEGATIVE

## 2019-07-08 LAB — CREATININE, SERUM
Creatinine, Ser: 0.82 mg/dL (ref 0.61–1.24)
GFR calc Af Amer: >60 mL/min (ref >60)
GFR calc non Af Amer: >60 mL/min (ref >60)

## 2019-07-08 LAB — CRYPTOCOCCAL ANTIGEN: Crypto Ag: NEGATIVE

## 2019-07-08 LAB — BRAIN NATRIURETIC PEPTIDE: B Natriuretic Peptide: 407.1 pg/mL — ABNORMAL HIGH (ref 0.0–100.0)

## 2019-07-08 LAB — GLUCOSE, CAPILLARY: Glucose-Capillary: 181 mg/dL — ABNORMAL HIGH (ref 70–99)

## 2019-07-08 LAB — PROCALCITONIN: Procalcitonin: 3.26 ng/mL

## 2019-07-08 LAB — LACTIC ACID, PLASMA
Lactic Acid, Venous: 1.6 mmol/L (ref 0.5–1.9)
Lactic Acid, Venous: 1.4 mmol/L (ref 0.5–1.9)

## 2019-07-08 MED ORDER — LACTATED RINGERS IV BOLUS (SEPSIS)
1000.0000 mL | Freq: Once | INTRAVENOUS | Status: AC
  Administered 2019-07-08: 1000 mL via INTRAVENOUS

## 2019-07-08 MED ORDER — FERROUS SULFATE 325 (65 FE) MG PO TABS
325.0000 mg | ORAL_TABLET | Freq: Every day | ORAL | Status: DC
  Administered 2019-07-09 – 2019-07-17 (×6): 325 mg via ORAL
  Filled 2019-07-08 (×7): qty 1

## 2019-07-08 MED ORDER — IOHEXOL 350 MG/ML SOLN
80.0000 mL | Freq: Once | INTRAVENOUS | Status: AC | PRN
  Administered 2019-07-08: 80 mL via INTRAVENOUS

## 2019-07-08 MED ORDER — ORAL CARE MOUTH RINSE
15.0000 mL | Freq: Two times a day (BID) | OROMUCOSAL | Status: DC
  Administered 2019-07-08 – 2019-07-18 (×15): 15 mL via OROMUCOSAL

## 2019-07-08 MED ORDER — SODIUM CHLORIDE (PF) 0.9 % IJ SOLN
INTRAMUSCULAR | Status: AC
  Filled 2019-07-08: qty 50

## 2019-07-08 MED ORDER — MAGIC MOUTHWASH
5.0000 mL | Freq: Three times a day (TID) | ORAL | Status: DC
  Filled 2019-07-08: qty 5

## 2019-07-08 MED ORDER — SALINE SPRAY 0.65 % NA SOLN
1.0000 | NASAL | Status: DC | PRN
  Filled 2019-07-08 (×2): qty 44

## 2019-07-08 MED ORDER — VANCOMYCIN HCL IN DEXTROSE 1-5 GM/200ML-% IV SOLN
1000.0000 mg | Freq: Two times a day (BID) | INTRAVENOUS | Status: DC
  Administered 2019-07-09 – 2019-07-10 (×3): 1000 mg via INTRAVENOUS
  Filled 2019-07-08 (×3): qty 200

## 2019-07-08 MED ORDER — SODIUM CHLORIDE 0.9 % IV SOLN
2.0000 g | Freq: Three times a day (TID) | INTRAVENOUS | Status: DC
  Administered 2019-07-08 – 2019-07-10 (×6): 2 g via INTRAVENOUS
  Filled 2019-07-08 (×6): qty 2

## 2019-07-08 MED ORDER — FLUCONAZOLE 100 MG PO TABS
100.0000 mg | ORAL_TABLET | Freq: Every day | ORAL | Status: DC
  Administered 2019-07-09 – 2019-07-10 (×2): 100 mg via ORAL
  Filled 2019-07-08 (×2): qty 1

## 2019-07-08 MED ORDER — ALTEPLASE 2 MG IJ SOLR
2.0000 mg | Freq: Once | INTRAMUSCULAR | Status: AC
  Administered 2019-07-08: 2 mg
  Filled 2019-07-08: qty 2

## 2019-07-08 MED ORDER — HEPARIN SODIUM (PORCINE) 5000 UNIT/ML IJ SOLN: 5000.0000 [IU] | Freq: Three times a day (TID) | INTRAMUSCULAR | Status: DC

## 2019-07-08 MED ORDER — VANCOMYCIN HCL 1500 MG/300ML IV SOLN
1500.0000 mg | INTRAVENOUS | Status: AC
  Administered 2019-07-08: 1500 mg via INTRAVENOUS
  Filled 2019-07-08: qty 300

## 2019-07-08 MED ORDER — MAGIC MOUTHWASH W/LIDOCAINE
5.0000 mL | Freq: Three times a day (TID) | ORAL | Status: DC | PRN
  Administered 2019-07-14: 5 mL via ORAL
  Filled 2019-07-08 (×2): qty 5

## 2019-07-08 MED ORDER — ONDANSETRON HCL 4 MG/2ML IJ SOLN
4.0000 mg | Freq: Four times a day (QID) | INTRAMUSCULAR | Status: DC | PRN
  Administered 2019-08-28: 4 mg via INTRAVENOUS
  Filled 2019-07-08: qty 2

## 2019-07-08 MED ORDER — INSULIN ASPART 100 UNIT/ML ~~LOC~~ SOLN
0.0000 [IU] | Freq: Three times a day (TID) | SUBCUTANEOUS | Status: DC
  Administered 2019-07-09 (×2): 3 [IU] via SUBCUTANEOUS
  Administered 2019-07-09: 1 [IU] via SUBCUTANEOUS
  Administered 2019-07-10: 2 [IU] via SUBCUTANEOUS
  Administered 2019-07-10: 1 [IU] via SUBCUTANEOUS
  Administered 2019-07-10: 5 [IU] via SUBCUTANEOUS
  Administered 2019-07-11: 1 [IU] via SUBCUTANEOUS
  Administered 2019-07-11: 3 [IU] via SUBCUTANEOUS
  Administered 2019-07-11 – 2019-07-12 (×2): 2 [IU] via SUBCUTANEOUS
  Administered 2019-07-12: 3 [IU] via SUBCUTANEOUS
  Filled 2019-07-08: qty 0.09

## 2019-07-08 MED ORDER — LACTATED RINGERS IV BOLUS (SEPSIS)
250.0000 mL | Freq: Once | INTRAVENOUS | Status: AC
  Administered 2019-07-08: 250 mL via INTRAVENOUS

## 2019-07-08 MED ORDER — ENSURE ENLIVE PO LIQD
237.0000 mL | Freq: Two times a day (BID) | ORAL | Status: DC
  Administered 2019-07-09 – 2019-07-16 (×8): 237 mL via ORAL

## 2019-07-08 MED ORDER — PANTOPRAZOLE SODIUM 40 MG IV SOLR
40.0000 mg | Freq: Every day | INTRAVENOUS | Status: DC
  Administered 2019-07-08 – 2019-07-19 (×12): 40 mg via INTRAVENOUS
  Filled 2019-07-08 (×12): qty 40

## 2019-07-08 MED ORDER — ADULT MULTIVITAMIN W/MINERALS CH
2.0000 | ORAL_TABLET | Freq: Every day | ORAL | Status: DC
  Administered 2019-07-09 – 2019-07-17 (×5): 2 via ORAL
  Filled 2019-07-08 (×5): qty 2

## 2019-07-08 MED ORDER — ACETAMINOPHEN 325 MG PO TABS
650.0000 mg | ORAL_TABLET | ORAL | Status: DC | PRN
  Administered 2019-07-10: 650 mg via ORAL
  Filled 2019-07-08: qty 2

## 2019-07-08 MED ORDER — FLUCONAZOLE 200 MG PO TABS
200.0000 mg | ORAL_TABLET | Freq: Once | ORAL | Status: AC
  Administered 2019-07-08: 200 mg via ORAL
  Filled 2019-07-08: qty 1

## 2019-07-08 MED ORDER — CHLORHEXIDINE GLUCONATE CLOTH 2 % EX PADS
6.0000 | MEDICATED_PAD | Freq: Every day | CUTANEOUS | Status: DC
  Administered 2019-07-08 – 2019-07-10 (×3): 6 via TOPICAL

## 2019-07-08 MED ORDER — SODIUM CHLORIDE 0.9 % IV SOLN
500.0000 mg | INTRAVENOUS | Status: DC
  Administered 2019-07-08 – 2019-07-11 (×4): 500 mg via INTRAVENOUS
  Filled 2019-07-08 (×4): qty 500

## 2019-07-08 MED ORDER — SODIUM CHLORIDE 0.9% FLUSH
10.0000 mL | Freq: Two times a day (BID) | INTRAVENOUS | Status: DC
  Administered 2019-07-08 – 2019-07-11 (×4): 10 mL
  Administered 2019-07-11: 20 mL
  Administered 2019-07-12 (×2): 10 mL
  Administered 2019-07-13: 20 mL
  Administered 2019-07-13 – 2019-07-14 (×2): 10 mL
  Administered 2019-07-14: 20 mL
  Administered 2019-07-15 – 2019-07-18 (×7): 10 mL
  Administered 2019-07-19: 30 mL
  Administered 2019-07-20: 10 mL
  Administered 2019-07-20: 30 mL
  Administered 2019-07-21 – 2019-07-22 (×3): 10 mL
  Administered 2019-07-22: 25 mL
  Administered 2019-07-23 – 2019-08-10 (×34): 10 mL
  Administered 2019-08-10 – 2019-08-11 (×2): 20 mL
  Administered 2019-08-11 – 2019-08-16 (×10): 10 mL
  Administered 2019-08-16: 09:00:00 40 mL
  Administered 2019-08-17 – 2019-08-18 (×3): 10 mL
  Administered 2019-08-18: 40 mL
  Administered 2019-08-19 – 2019-08-28 (×18): 10 mL

## 2019-07-08 MED ORDER — ATORVASTATIN CALCIUM 10 MG PO TABS
10.0000 mg | ORAL_TABLET | Freq: Every day | ORAL | Status: DC
  Administered 2019-07-09 – 2019-07-17 (×5): 10 mg via ORAL
  Filled 2019-07-08 (×6): qty 1

## 2019-07-08 MED ORDER — ENOXAPARIN SODIUM 40 MG/0.4ML ~~LOC~~ SOLN
40.0000 mg | SUBCUTANEOUS | Status: DC
  Administered 2019-07-08 – 2019-08-20 (×44): 40 mg via SUBCUTANEOUS
  Filled 2019-07-08 (×44): qty 0.4

## 2019-07-08 MED ORDER — SODIUM CHLORIDE 0.9 % IV SOLN
2.0000 g | INTRAVENOUS | Status: AC
  Administered 2019-07-08: 2 g via INTRAVENOUS
  Filled 2019-07-08: qty 2

## 2019-07-08 NOTE — Telephone Encounter (Signed)
Called pt unable to reach on cell #.   Called Emergency contact michele who advised " I can't get his oxygen sats above 52% he said he is really tired and just wants to sleep. He is panting and is not breathing through his nose, so he's not getting the oxygen. I have it as high as it will go."  Instructed [name] to call 911, take the nasal canula out of his nose and put in his mouth to assist getting oxygen the as pt is not getting any oxygen.  [name] verbalized understanding confirmed she will call 911.

## 2019-07-08 NOTE — ED Notes (Addendum)
Pt. Received a diet ginger ale. Nurse aware.

## 2019-07-08 NOTE — ED Notes (Signed)
Pt aware of urine specimen. Will collect urine when pt.Voids. Nurse aware.  

## 2019-07-08 NOTE — Progress Notes (Signed)
Notified provider and bedside nurse of need to order lactic acid.

## 2019-07-08 NOTE — Telephone Encounter (Signed)
Called [name] to confirm pt at ED, [name] advised she put the oxygen in his mouth and his sats came up to 85%.  [name] states " we are waiting on EMS to get here"  No further concerns.

## 2019-07-08 NOTE — ED Triage Notes (Signed)
Arrives via EMS from home, C/C SOB. In February pt was hospitalized with COVID, since D/C he wears 2L [place], over the past few days he has bumped it up to 4 and then 6 L, last night he got readings in the 48s on 6L [place]. EMS was unable to get an O2 reading, placed him on non-rebreather, maintaining at 90-92%, RR in mid 30s. EMS gave 125 mg Solumedrol. Hx of hodgkin's lymphoma. Lower lung sounds wheezing.   20 G LAC

## 2019-07-08 NOTE — H&P (Signed)
NAME:  Steve [name], MRN:  [id number], DOB:  29-Jun-1955, LOS: 0 ADMISSION DATE:  07/08/2019, CONSULTATION DATE:  3/29 REFERRING MD:  Steve [name], CHIEF COMPLAINT:  hypoxia   Brief History   Hodgkin's lymphoma Covid in Feb 2021, chronic hypoxic respiratory failure on 2L HOT since Acute worsening of respiratory failure  History of present illness   Steve [name] is a 64 y/o gentleman with a history of Hodgkin's lymphoma (stage III+, diagnosed 02/2019) on chemotherapy with AVD + Adcetris (brentuximab vedotin, doxorubicin, vinblastine, dacarbazine). He follows with Steve [name] in Oncology. His most recent treatment was about 3 weeks ago (06/17/19). Due to worsening respiratory status and inability to tolerate PO intake recently, he had his chemo delayed this week.  He has a history of neutropenic fever in the past. He receives G-CSF post-chemo. He had a good response to chemo on his first follow up PET scan.   His appetite has been poor and he has been receiving IV fluids. For the past few weeks his dyspnea has been worsening, and today he had saturations in the 50s at home on his supplemental oxygen, which he has been increasing recently on his home concentrator. He endorses sore throat and having white membrane in his throat. No cough, sputum production, edema, CP, abdominal distention, n/v/d. No change over port site or tract. No family history of lung disease.  Past Medical History  Hodgkin's lymphoma Staph septic arthritis of hip arthroplasty bilaterally Covid 19 viral pneumonia DM OSA HTN Pancreatitis GERD  Significant Hospital Events     Consults:    Procedures:    Significant Diagnostic Tests:  CTA chest 3/29: pending  Micro Data:  3/29 blood cultures   Antimicrobials:  Cefepime 3/29>> vanc 3/29>> Azithromycin 3/29>> Fluconazole 3/29>>  Interim history/subjective:    Objective   Blood pressure 124/76, pulse 97, temperature 98.2 F (36.8 C), temperature source Oral,  resp. rate (!) 48, height 5\' 6"  (1.676 m), weight 74.4 kg, SpO2 97 %.        Intake/Output Summary (Last 24 hours) at 07/08/2019 1922 Last data filed at 07/08/2019 1800 Gross per 24 hour  Intake 2650 ml  Output --  Net 2650 ml   Filed Weights   07/08/19 1223 07/08/19 1359  Weight: 74.4 kg 74.4 kg    Examination: General: frail-appearing man sitting up in bed in NAD HENT: [place]/AT, eyes anicteric. White plaques on soft palate and throat with only mild surrounding erythema.  Upper dentures in place.  Cracked lips. Lungs: Rales bilaterally, worse in the bases.  Tachypnea without accessory muscle use.  Able to speak in full sentences. Cardiovascular: Regular rate and rhythm, no murmurs.  Port on left chest without overlying erythema. Abdomen: Soft, nontender, nondistended Extremities: No edema, no cyanosis Neuro: Awake and alert, answering questions appropriately, moving all extremities spontaneously.  Normal speech. Derm: Dry flaky skin, no rashes.  CXR 3/29 personally reviewed and compared to 3/22 and 2/9 chest x-rays.  Persistent lateral infiltrates with progression over time encompassing the medial lung portions.  More right upper lobe involvement compared to previous.  Resolved Hospital Problem list     Assessment & Plan:  Acute on chronic hypoxic respiratory failure.  Chest x-ray with diffuse bilateral infiltrates appearing as a progression since his most recent chest x-ray with Covid.  Multiple possibilities for acute worsening exist-concern for progression to post Covid fibrosis, post Covid organizing pneumonia/ BOOP, post-viral bacterial or fungal pneumonia, PE, acute pulmonary edema due to heart failure. -Empiric antibiotics for  community-acquired pneumonia-vancomycin, cefepime, azithromycin. -Check procalcitonin, Legionella urinary antigen, sputum culture, blood cultures -Echocardiogram -D-dimer, BNP level -CTA to evaluate for distribution of parenchymal lung abnormalities and  definitively rule out PE given recent Covid infection and active malignancy. -With oral thrush and history of neutropenia, increased index of suspicion for fungal infections.  Checking histoplasma, cryptococcus, Aspergillus antigens and Fungitell. -Doubt toxicity from chemotherapy as his current chemo regimen a/w <1% incidence of pulmonary toxicity (https://www.nejm.org/doi/full/10.1056/nejmoa1708984) -Supplemental oxygen as required to maintain SPO2 greater than 88%. -If intubated would recommend bronchoscopy to obtain cultures given history of immunosuppression.  Currently requiring too much supplemental oxygen to safely perform this procedure.   DM2 -Accu-Cheks every 4 hours with sliding scale insulin as needed -Goal BG 140-180 while admitted to the ICU   Chronic anemia-stable -Transfuse for hemoglobin less than 7 -Continue to monitor -Continue enteral iron   Oral thrush -Fluconazole -Magic mouthwash   History of Hodgkin's lymphoma -Continue to hold chemotherapy -DVT prophylaxis with LMWH   HLD -Continue PTA atorvastatin  Best practice:  Diet: regular Pain/Anxiety/Delirium protocol (if indicated): n/a VAP protocol (if indicated): n/a DVT prophylaxis: lovenox GI prophylaxis: protonix Glucose control: SSI Mobility: up with assist Code Status: full Family Communication: wife- surrogate decision maker Disposition: ICU  Labs   CBC: Recent Labs  Lab 07/08/19 1244 07/08/19 1331  WBC 37.6*  --   NEUTROABS 33.4*  --   HGB 8.9* 9.5*  HCT 28.6* 28.0*  MCV 91.4  --   PLT 283  --     Basic Metabolic Panel: Recent Labs  Lab 07/08/19 1244 07/08/19 1331  NA 143 140  K 3.9 3.7  CL 104 102  CO2 27  --   GLUCOSE 185* 177*  BUN 22 19  CREATININE 0.92 0.80  CALCIUM 8.4*  --    GFR: Estimated Creatinine Clearance: 85.3 mL/min (by C-G formula based on SCr of 0.8 mg/dL). Recent Labs  Lab 07/08/19 1244 07/08/19 1645  WBC 37.6*  --   LATICACIDVEN 1.6 1.4     Liver Function Tests: Recent Labs  Lab 07/08/19 1244  AST 25  ALT 12  ALKPHOS 85  BILITOT 1.1  PROT 7.3  ALBUMIN 2.4*   No results for input(s): LIPASE, AMYLASE in the last 168 hours. No results for input(s): AMMONIA in the last 168 hours.  ABG    Component Value Date/Time   PHART 7.473 (H) 07/08/2019 1245   PCO2ART 37.1 07/08/2019 1245   PO2ART 64.5 (L) 07/08/2019 1245   HCO3 26.8 07/08/2019 1245   TCO2 28 07/08/2019 1331   O2SAT 91.2 07/08/2019 1245     Coagulation Profile: No results for input(s): INR, PROTIME in the last 168 hours.  Cardiac Enzymes: No results for input(s): CKTOTAL, CKMB, CKMBINDEX, TROPONINI in the last 168 hours.  HbA1C: Hgb A1c MFr Bld  Date/Time Value Ref Range Status  05/19/2019 03:56 AM 8.3 (H) 4.8 - 5.6 % Final    Comment:    (NOTE) Pre diabetes:          5.7%-6.4% Diabetes:              >6.4% Glycemic control for   <7.0% adults with diabetes   02/19/2019 09:16 AM 7.0 (H) 4.6 - 6.5 % Final    Comment:    Glycemic Control Guidelines for People with Diabetes:Non Diabetic:  <6%Goal of Therapy: <7%Additional Action Suggested:  >8%     CBG: No results for input(s): GLUCAP in the last 168 hours.  Review of Systems:   +  sore throat, odynophagia, SOB Negative for CP, edema, abdominal distention, rashes, cough, sputum production, nausea, vomiting, diarrhea   Past Medical History  He,  has a past medical history of Abscess of muscle (08/10/2011), Diabetes mellitus without complication ([place]), GERD (gastroesophageal reflux disease), Hip dysplasia, congenital, Hodgkin lymphoma ([address]), Hypertension, Pancreatitis, Postoperative anemia due to acute blood loss (09/07/2012), Septic arthritis of hip ([place]) (09/05/2012), and Sleep apnea.   Surgical History    Past Surgical History:  Procedure Laterality Date  . COLONOSCOPY  04/26/2007  . HERNIA REPAIR     inguinal hernia x3  . IR IMAGING GUIDED PORT INSERTION  03/29/2019  . JOINT REPLACEMENT   2002 & 2007   bilateral hip replacement  . LYMPH NODE BIOPSY Right 03/20/2019   Procedure: DEEP RIGHT SUPRACLAVICULAR LYMPH NODE EXCISION;  Surgeon: Fanny Skates, MD;  Location: [facility];  Service: General;  Laterality: Right;  . MULTIPLE EXTRACTIONS WITH ALVEOLOPLASTY  07/28/2011   Procedure: MULTIPLE EXTRACION WITH ALVEOLOPLASTY;  Surgeon: Lenn Cal, DDS;  Location: [facility];  Service: Oral Surgery;  Laterality: N/A;  Extraction of tooth #'s 2,3,4,5,6,11,12,13,15,19,22 with alveoloplasty.  . shoulder repair - right for separation of shoulder    . TEE WITHOUT CARDIOVERSION  07/29/2011   Procedure: TRANSESOPHAGEAL ECHOCARDIOGRAM (TEE);  Surgeon: Josue Hector, MD;  Location: [facility];  Service: Cardiovascular;  Laterality: N/A;  . TOTAL HIP REVISION Right 09/05/2012   Procedure: RIGHT HIP RESECTION ARTHROPLASTY WITH ANTIBIOTIC SPACERS;  Surgeon: [name], MD;  Location: [facility];  Service: Orthopedics;  Laterality: Right;  . TOTAL HIP REVISION Right 11/30/2012   Procedure: RIGHT TOTAL HIP ARTHROPLASTY REIMPLANTATION;  Surgeon: [name], MD;  Location: [facility];  Service: Orthopedics;  Laterality: Right;     Social History   reports that he has never smoked. He has never used smokeless tobacco. He reports current alcohol use. He reports that he does not use drugs.   Family History   His family history includes Heart attack in his mother; Melanoma in his mother. There is no history of Hyperlipidemia, Sudden death, Hypertension, or Diabetes.   Allergies No Known Allergies   Home Medications  Prior to Admission medications   Medication Sig Start Date End Date Taking? Authorizing Provider  atorvastatin (LIPITOR) 10 MG tablet TAKE 1 TABLET BY MOUTH EVERY DAY Patient taking differently: Take 10 mg by mouth daily.  04/16/19  Yes Saguier, Percell Miller, PA-C  dexamethasone (DECADRON) 4 MG tablet TAKE 2 TABLETS BY MOUTH ONCE A DAY FOR 3 DAYS AFTER CHEMO. TAKE WITH  FOOD. Patient taking differently: Take 8 mg by mouth See admin instructions. Take 8mg  by mouth once a day for 3 days after chemo. Take with food. 05/22/19  Yes Tish Men, MD  lidocaine-prilocaine (EMLA) cream Apply to affected area once Patient taking differently: Apply 1 application topically daily as needed. Port. Apply to affected area once 03/25/19  Yes Tish Men, MD  LORazepam (ATIVAN) 0.5 MG tablet TAKE 1 TABLET (0.5 MG TOTAL) BY MOUTH EVERY 6 (SIX) HOURS AS NEEDED (NAUSEA OR VOMITING). 04/22/19  Yes Tish Men, MD  metFORMIN (GLUCOPHAGE) 500 MG tablet TAKE 1 TABLET BY MOUTH TWICE A DAY WITH MEALS Patient taking differently: Take 500 mg by mouth 2 (two) times daily with a meal.  02/12/19  Yes Saguier, Percell Miller, PA-C  Multiple Vitamin (MULTIVITAMIN WITH MINERALS) TABS tablet Take 2 tablets by mouth daily. flinstones   Yes [provider]  naproxen sodium (ALEVE) 220 MG tablet Take  440 mg by mouth 2 (two) times daily as needed (pain).   Yes [provider]  ondansetron (ZOFRAN) 8 MG tablet TAKE 1 TAB BY MOUTH 2 (TWO) TIMES DAILY AS NEEDED. START ON THE THIRD DAY AFTER CHEMOTHERAPY. Patient taking differently: Take 8 mg by mouth See admin instructions. 8mg  twice daily as needed for nausea/vomitting on the third day after chemo 05/22/19  Yes Tish Men, MD  prochlorperazine (COMPAZINE) 10 MG tablet Take 1 tablet (10 mg total) by mouth every 6 (six) hours as needed (Nausea or vomiting). 06/12/19  Yes Tish Men, MD  sildenafil (VIAGRA) 100 MG tablet Take 0.5-1 tablets (50-100 mg total) by mouth daily as needed for erectile dysfunction. 06/13/17  Yes Saguier, Percell Miller, PA-C  sodium chloride (OCEAN) 0.65 % SOLN nasal spray Place 1 spray into both nostrils as needed for congestion.   Yes [provider]  ferrous sulfate 325 (65 FE) MG tablet Take 1 tablet (325 mg total) by mouth daily with breakfast. Patient not taking: Reported on 07/08/2019 05/22/19   Bonnielee Haff, MD   guaiFENesin-dextromethorphan [facility] DM) 100-10 MG/5ML syrup Take 5 mLs by mouth every 4 (four) hours as needed for cough. Patient not taking: Reported on 07/08/2019 04/19/19   Tish Men, MD     Critical care time: 55 minutes    Julian Hy, DO 07/08/19 7:22 PM [facility]

## 2019-07-08 NOTE — ED Notes (Addendum)
Per Dr. [name].Pt. Received crackers and peanut butter. Nurse aware.

## 2019-07-08 NOTE — Progress Notes (Signed)
A consult was received from an ED physician for Vancomycin and Cefepime per pharmacy dosing. The patient's profile has been reviewed for ht/wt/allergies/indication/available labs.    A one time order has been placed for Vancomycin 1500mg  IV and Cefepime 2g IV.  Further antibiotics/pharmacy consults should be ordered by admitting physician if indicated.                       Thank you, [name] 07/08/2019  1:45 PM

## 2019-07-08 NOTE — Progress Notes (Signed)
Pharmacy Antibiotic Note  Steve [name] is a 64 y.o. male admitted on 07/08/2019 with respiratory failure, concern for PNA.  Pharmacy has been consulted for vancomycin + cefepime dosing.  Pt has PMH significant for Hodgkin's lymphoma, currently undergoing chemotherapy. Pt admitted to hospital in Feb 2021 for COVID PNA and has been on home oxygen since that time. Broad spectrum antibiotics being initiated for PNA.  Today, 07/08/19  WBC 37.6  SCr 0.8, CrCl ~80 mL/min  Lactate 1.6 > 1.4  Afebrile  Plan:  Cefepime 2 g IV q8h  Vancomycin 1500 mg LD followed by 1000 mg IV q12h  Goal vancomycin AUC 400-550. Check levels once at steady state if indicated  Azithromycin 500 mg IV daily x5 days per MD  Fluconazole 200 mg PO once followed by 100 mg PO daily for thrush  Recommend checking MRSA PCR   Follow culture data, renal function.   Height: 5\' 6"  (167.6 cm) Weight: 164 lb (74.4 kg) IBW/kg (Calculated) : 63.8  Temp (24hrs), Avg:98.2 F (36.8 C), Min:98.2 F (36.8 C), Max:98.2 F (36.8 C)  Recent Labs  Lab 07/08/19 1244 07/08/19 1331 07/08/19 1645  WBC 37.6*  --   --   CREATININE 0.92 0.80  --   LATICACIDVEN 1.6  --  1.4    Estimated Creatinine Clearance: 85.3 mL/min (by C-G formula based on SCr of 0.8 mg/dL).    No Known Allergies  Antimicrobials this admission: cefepime 3/29 >>  vancomycin 3/29 >>  Azithromycin 3/29 >> Fluconazole 3/29 >>  Dose adjustments this admission:  Microbiology results: 3/29 BCx: ngtd 3/29 UCx: sent  3/29 Sputum: Sent   Thank you for allowing pharmacy to be a part of this patient's care.  Lenis Noon, PharmD 07/08/2019 7:38 PM

## 2019-07-08 NOTE — Progress Notes (Signed)
[place]  [phone] [phone]     ID: AHMERE HEMENWAY DOB: [birth year]  MR#: [id number]  DXI#:[id number]  Patient Care Team: Elise Benne as PCP - General (Internal Medicine) Tish Men, MD as Medical Oncologist (Oncology) Cordelia Poche, RN as Oncology Nurse Navigator Chauncey Cruel, MD OTHER MD:  CHIEF COMPLAINT: shortness of breath/ neutrophilia/ bilateral lung infiltrates  CURRENT TREATMENT: in process   HISTORY OF CURRENT ILLNESS: Mr. Mcewen was diagnosed with Hodgkin's lymphoma by right cervical lymph node biopsy 03/20/2019.  This showed classical/nodular sclerosing subtype.  It was stage III on staging.  He has been treated with brentuximab (Adcetris, and amino conjugate), as well as dacarbazine, doxorubicin, and vinblastine.  He did not receive any bleomycin.  His first 3 cycles were received every 2 weeks beginning 04/08/2019, cycle 4 and 5 have had to be delays because of cytopenias and other issues.  In addition the patient was diagnosed with COVID-19 infection the first week in February, presenting with high fever, infiltrates, and loss of smell and taste.  He was admitted, received remdesivir and dexamethasone and was discharged after about a week on home oxygen, which he continued on at 2 L/min until the past week or so when he has required increasing doses of oxygen.  The patient was most recently seen by Dr. Maylon Peppers on 07/01/2019.  At that time the patient was noted to be possibly dehydrated, with a low blood pressure, and treatment was again delayed and he received intravenous fluids daily for the past few days.  Today his breathing became much more labored despite high flow oxygen at home and he presented to the emergency room.  Here he has been found to have no fever.  He has a persistent cough which he tells me he has had since the start of his chemotherapy.  However he is producing no phlegm and no hemoptysis.  He  denies pleurisy.  A chest x-ray was obtained which shows bilateral widespread airspace disease and small bilateral effusions.  A CT angio is pending  The patient's subsequent history is as detailed below.  INTERVAL HISTORY: I met with the patient in his emergency department room.  Nurse was present during part of the visit  REVIEW OF SYSTEMS: As per the history of present illness above.  The patient in addition denies recent headaches, visual changes, nausea, vomiting, neck stiffness, gait imbalance, or falls.  He has had no significant change in bowel habits and particularly no diarrhea.  There has been no change in bladder habits.  There has been no rash.  PAST MEDICAL HISTORY: Past Medical History:  Diagnosis Date  . Abscess of muscle 08/10/2011   staph infection of right hip   . Diabetes mellitus without complication ([place])   . GERD (gastroesophageal reflux disease)    rare reflux - no meds for reflux - NO PROBLEM IN PAST SEVERAL YRS  . Hip dysplasia, congenital    no surgery as a child for hip dysplasia - has had bilateral hip replacements as an adult  . Hodgkin lymphoma ([place])   . Hypertension   . Pancreatitis   . Postoperative anemia due to acute blood loss 09/07/2012  . Septic arthritis of hip ([place]) 09/05/2012   PT'S TOTAL HIP JOINT REMOVED - ANTIBIOTIC SPACE PLACED AND PT HAS FINISHED IV ANTIBIOTICS ( PICC LINE REMOVED)  . Sleep apnea    USES CPAP    PAST SURGICAL HISTORY: Past Surgical History:  Procedure Laterality Date  .  COLONOSCOPY  04/26/2007  . HERNIA REPAIR     inguinal hernia x3  . IR IMAGING GUIDED PORT INSERTION  03/29/2019  . JOINT REPLACEMENT  2002 & 2007   bilateral hip replacement  . LYMPH NODE BIOPSY Right 03/20/2019   Procedure: DEEP RIGHT SUPRACLAVICULAR LYMPH NODE EXCISION;  Surgeon: Fanny Skates, MD;  Location: [facility];  Service: General;  Laterality: Right;  . MULTIPLE EXTRACTIONS WITH ALVEOLOPLASTY  07/28/2011   Procedure: MULTIPLE  EXTRACION WITH ALVEOLOPLASTY;  Surgeon: Lenn Cal, DDS;  Location: [facility];  Service: Oral Surgery;  Laterality: N/A;  Extraction of tooth #'s 2,3,4,5,6,11,12,13,15,19,22 with alveoloplasty.  . shoulder repair - right for separation of shoulder    . TEE WITHOUT CARDIOVERSION  07/29/2011   Procedure: TRANSESOPHAGEAL ECHOCARDIOGRAM (TEE);  Surgeon: Josue Hector, MD;  Location: [facility];  Service: Cardiovascular;  Laterality: N/A;  . TOTAL HIP REVISION Right 09/05/2012   Procedure: RIGHT HIP RESECTION ARTHROPLASTY WITH ANTIBIOTIC SPACERS;  Surgeon: [name], MD;  Location: [facility];  Service: Orthopedics;  Laterality: Right;  . TOTAL HIP REVISION Right 11/30/2012   Procedure: RIGHT TOTAL HIP ARTHROPLASTY REIMPLANTATION;  Surgeon: [name], MD;  Location: [facility];  Service: Orthopedics;  Laterality: Right;    FAMILY HISTORY Family History  Problem Relation Age of Onset  . Melanoma Mother   . Heart attack Mother   . Hyperlipidemia Neg Hx   . Sudden death Neg Hx   . Hypertension Neg Hx   . Diabetes Neg Hx     SOCIAL HISTORY:  Mr. Alipio is married and has 2 small children at home.  His wife has received the COVID-19 vaccinations    HEALTH MAINTENANCE: Social History   Tobacco Use  . Smoking status: Never Smoker  . Smokeless tobacco: Never Used  Substance Use Topics  . Alcohol use: Yes    Comment: rare beer. 5 times a year at most. 2 beers when he drinks.  . Drug use: No     No Known Allergies  Current Facility-Administered Medications  Medication Dose Route Frequency Provider Last Rate Last Admin  . azithromycin (ZITHROMAX) 500 mg in sodium chloride 0.9 % 250 mL IVPB  500 mg Intravenous Q24H Julian Hy, DO 250 mL/hr at 07/08/19 1832 500 mg at 07/08/19 1832   Current Outpatient Medications  Medication Sig Dispense Refill  . atorvastatin (LIPITOR) 10 MG tablet TAKE 1 TABLET BY MOUTH EVERY DAY (Patient taking differently: Take 10 mg by mouth daily. )  90 tablet 1  . dexamethasone (DECADRON) 4 MG tablet TAKE 2 TABLETS BY MOUTH ONCE A DAY FOR 3 DAYS AFTER CHEMO. TAKE WITH FOOD. (Patient taking differently: Take 8 mg by mouth See admin instructions. Take '8mg'$  by mouth once a day for 3 days after chemo. Take with food.) 30 tablet 1  . lidocaine-prilocaine (EMLA) cream Apply to affected area once (Patient taking differently: Apply 1 application topically daily as needed. Port. Apply to affected area once) 30 g 3  . LORazepam (ATIVAN) 0.5 MG tablet TAKE 1 TABLET (0.5 MG TOTAL) BY MOUTH EVERY 6 (SIX) HOURS AS NEEDED (NAUSEA OR VOMITING). 30 tablet 0  . metFORMIN (GLUCOPHAGE) 500 MG tablet TAKE 1 TABLET BY MOUTH TWICE A DAY WITH MEALS (Patient taking differently: Take 500 mg by mouth 2 (two) times daily with a meal. ) 180 tablet 1  . Multiple Vitamin (MULTIVITAMIN WITH MINERALS) TABS tablet Take 2 tablets by mouth daily. flinstones    . naproxen  sodium (ALEVE) 220 MG tablet Take 440 mg by mouth 2 (two) times daily as needed (pain).    . ondansetron (ZOFRAN) 8 MG tablet TAKE 1 TAB BY MOUTH 2 (TWO) TIMES DAILY AS NEEDED. START ON THE THIRD DAY AFTER CHEMOTHERAPY. (Patient taking differently: Take 8 mg by mouth See admin instructions. '8mg'$  twice daily as needed for nausea/vomitting on the third day after chemo) 30 tablet 1  . prochlorperazine (COMPAZINE) 10 MG tablet Take 1 tablet (10 mg total) by mouth every 6 (six) hours as needed (Nausea or vomiting). 30 tablet 1  . sildenafil (VIAGRA) 100 MG tablet Take 0.5-1 tablets (50-100 mg total) by mouth daily as needed for erectile dysfunction. 10 tablet 1  . sodium chloride (OCEAN) 0.65 % SOLN nasal spray Place 1 spray into both nostrils as needed for congestion.    . ferrous sulfate 325 (65 FE) MG tablet Take 1 tablet (325 mg total) by mouth daily with breakfast. (Patient not taking: Reported on 07/08/2019) 30 tablet 1  . guaiFENesin-dextromethorphan (ROBITUSSIN DM) 100-10 MG/5ML syrup Take 5 mLs by mouth every 4  (four) hours as needed for cough. (Patient not taking: Reported on 07/08/2019) 118 mL 0    OBJECTIVE: Middle-aged white man examined in bed  Vitals:   07/08/19 1800 07/08/19 1830  BP: 118/80 129/79  Pulse: (!) 105 94  Resp: (!) 29 (!) 34  Temp:    SpO2: 93% 90%     Body mass index is 26.47 kg/m.   Wt Readings from Last 3 Encounters:  07/08/19 164 lb (74.4 kg)  07/01/19 164 lb 12.8 oz (74.8 kg)  07/01/19 164 lb 12.8 oz (74.8 kg)     Ocular: Sclerae unicteric, pupils round and equal Ear-nose-throat: Receiving high flow oxygen by nasal cannula Lungs no wheezes or rhonchi--auscultated anterolaterally Heart regular rate and rhythm Abd soft, nontender Neuro: non-focal, well-oriented, positive affect  LAB RESULTS:  CMP     Component Value Date/Time   NA 140 07/08/2019 1331   K 3.7 07/08/2019 1331   CL 102 07/08/2019 1331   CO2 27 07/08/2019 1244   GLUCOSE 177 (H) 07/08/2019 1331   BUN 19 07/08/2019 1331   CREATININE 0.80 07/08/2019 1331   CREATININE 1.98 (H) 07/01/2019 1011   CALCIUM 8.4 (L) 07/08/2019 1244   PROT 7.3 07/08/2019 1244   ALBUMIN 2.4 (L) 07/08/2019 1244   AST 25 07/08/2019 1244   AST 13 (L) 07/01/2019 1011   ALT 12 07/08/2019 1244   ALT 10 07/01/2019 1011   ALKPHOS 85 07/08/2019 1244   BILITOT 1.1 07/08/2019 1244   BILITOT 0.5 07/01/2019 1011   GFRNONAA >60 07/08/2019 1244   GFRNONAA 35 (L) 07/01/2019 1011   GFRAA >60 07/08/2019 1244   GFRAA 40 (L) 07/01/2019 1011    No results found for: TOTALPROTELP, ALBUMINELP, A1GS, A2GS, BETS, BETA2SER, GAMS, MSPIKE, SPEI  No results found for: KPAFRELGTCHN, LAMBDASER, KAPLAMBRATIO  Lab Results  Component Value Date   WBC 37.6 (H) 07/08/2019   NEUTROABS 33.4 (H) 07/08/2019   HGB 9.5 (L) 07/08/2019   HCT 28.0 (L) 07/08/2019   MCV 91.4 07/08/2019   PLT 283 07/08/2019    '@LASTCHEMISTRY'$ @  No results found for: LABCA2  No components found for: [id number]  No results for input(s): INR in the last 168  hours.  No results found for: LABCA2  No results found for: UYQ034  No results found for: VQQ595  No results found for: GLO756  No results found for: CA2729  No components  found for: HGQUANT  No results found for: CEA1 / No results found for: CEA1   No results found for: AFPTUMOR  No results found for: CHROMOGRNA  No results found for: PSA1  Admission on 07/08/2019  Component Date Value Ref Range Status  . Sodium 07/08/2019 143  135 - 145 mmol/L Final  . Potassium 07/08/2019 3.9  3.5 - 5.1 mmol/L Final  . Chloride 07/08/2019 104  98 - 111 mmol/L Final  . CO2 07/08/2019 27  22 - 32 mmol/L Final  . Glucose, Bld 07/08/2019 185* 70 - 99 mg/dL Final   Glucose reference range applies only to samples taken after fasting for at least 8 hours.  . BUN 07/08/2019 22  8 - 23 mg/dL Final  . Creatinine, Ser 07/08/2019 0.92  0.61 - 1.24 mg/dL Final  . Calcium 07/08/2019 8.4* 8.9 - 10.3 mg/dL Final  . Total Protein 07/08/2019 7.3  6.5 - 8.1 g/dL Final  . Albumin 07/08/2019 2.4* 3.5 - 5.0 g/dL Final  . AST 07/08/2019 25  15 - 41 U/L Final  . ALT 07/08/2019 12  0 - 44 U/L Final  . Alkaline Phosphatase 07/08/2019 85  38 - 126 U/L Final  . Total Bilirubin 07/08/2019 1.1  0.3 - 1.2 mg/dL Final  . GFR calc non Af Amer 07/08/2019 >60  >60 mL/min Final  . GFR calc Af Amer 07/08/2019 >60  >60 mL/min Final  . Anion gap 07/08/2019 12  5 - 15 Final   Performed at [facility], [place] [address] [place], [place] [address]  . WBC 07/08/2019 37.6* 4.0 - 10.5 K/uL Final  . RBC 07/08/2019 3.13* 4.22 - 5.81 MIL/uL Final  . Hemoglobin 07/08/2019 8.9* 13.0 - 17.0 g/dL Final  . HCT 07/08/2019 28.6* 39.0 - 52.0 % Final  . MCV 07/08/2019 91.4  80.0 - 100.0 fL Final  . MCH 07/08/2019 28.4  26.0 - 34.0 pg Final  . MCHC 07/08/2019 31.1  30.0 - 36.0 g/dL Final  . RDW 07/08/2019 20.0* 11.5 - 15.5 % Final  . Platelets 07/08/2019 283  150 - 400 K/uL Final  . nRBC 07/08/2019 0.2  0.0 - 0.2 %  Final  . Neutrophils Relative % 07/08/2019 88  % Final  . Neutro Abs 07/08/2019 33.4* 1.7 - 7.7 K/uL Final  . Lymphocytes Relative 07/08/2019 5  % Final  . Lymphs Abs 07/08/2019 1.7  0.7 - 4.0 K/uL Final  . Monocytes Relative 07/08/2019 4  % Final  . Monocytes Absolute 07/08/2019 1.5* 0.1 - 1.0 K/uL Final  . Eosinophils Relative 07/08/2019 0  % Final  . Eosinophils Absolute 07/08/2019 0.0  0.0 - 0.5 K/uL Final  . Basophils Relative 07/08/2019 1  % Final  . Basophils Absolute 07/08/2019 0.2* 0.0 - 0.1 K/uL Final  . WBC Morphology 07/08/2019 VACUOLATED NEUTROPHILS   Final  . Immature Granulocytes 07/08/2019 2  % Final  . Abs Immature Granulocytes 07/08/2019 0.76* 0.00 - 0.07 K/uL Final  . Burr Cells 07/08/2019 PRESENT   Final  . Polychromasia 07/08/2019 PRESENT   Final  . Basophilic Stippling 01/75/1025 PRESENT   Final  . Ovalocytes 07/08/2019 PRESENT   Final   Performed at [facility], [place] [address] [place], [place] [address]  . FIO2 07/08/2019 100.00   Final  . O2 Content 07/08/2019 80.0  L/min Final  . pH, Arterial 07/08/2019 7.473* 7.350 - 7.450 Final  . pCO2 arterial 07/08/2019 37.1  32.0 - 48.0 mmHg Final  . pO2,  Arterial 07/08/2019 64.5* 83.0 - 108.0 mmHg Final  . Bicarbonate 07/08/2019 26.8  20.0 - 28.0 mmol/L Final  . Acid-Base Excess 07/08/2019 3.5* 0.0 - 2.0 mmol/L Final  . O2 Saturation 07/08/2019 91.2  % Final  . Patient temperature 07/08/2019 98.5   Final  . Collection site 07/08/2019 RIGHT RADIAL   Final  . Drawn by 07/08/2019 [id number]   Final  . Sample type 07/08/2019 ARTERIAL   Final  . Chauncey Reading test (pass/fail) 07/08/2019 PASS  PASS Final   Performed at [facility], [place] [address] [place], [place] [address]  . Sodium 07/08/2019 140  135 - 145 mmol/L Final  . Potassium 07/08/2019 3.7  3.5 - 5.1 mmol/L Final  . Chloride 07/08/2019 102  98 - 111 mmol/L Final  . BUN 07/08/2019 19  8 - 23 mg/dL Final  . Creatinine, Ser 07/08/2019  0.80  0.61 - 1.24 mg/dL Final  . Glucose, Bld 07/08/2019 177* 70 - 99 mg/dL Final   Glucose reference range applies only to samples taken after fasting for at least 8 hours.  . Calcium, Ion 07/08/2019 1.09* 1.15 - 1.40 mmol/L Final  . TCO2 07/08/2019 28  22 - 32 mmol/L Final  . Hemoglobin 07/08/2019 9.5* 13.0 - 17.0 g/dL Final  . HCT 07/08/2019 28.0* 39.0 - 52.0 % Final  . Lactic Acid, Venous 07/08/2019 1.6  0.5 - 1.9 mmol/L Final   Performed at [facility], [place] [address] [place], [place] [address]  . Lactic Acid, Venous 07/08/2019 1.4  0.5 - 1.9 mmol/L Final   Performed at [place] [address], [place], [place] [address]    (this displays the last labs from the last 3 days)  No results found for: TOTALPROTELP, ALBUMINELP, A1GS, A2GS, BETS, BETA2SER, GAMS, MSPIKE, SPEI (this displays SPEP labs)  No results found for: KPAFRELGTCHN, LAMBDASER, KAPLAMBRATIO (kappa/lambda light chains)  No results found for: HGBA, HGBA2QUANT, HGBFQUANT, HGBSQUAN (Hemoglobinopathy evaluation)   Lab Results  Component Value Date   LDH 143 05/17/2019    Lab Results  Component Value Date   IRON 19 (L) 05/17/2019   TIBC 164 (L) 05/17/2019   IRONPCTSAT 12 (L) 05/17/2019   (Iron and TIBC)  Lab Results  Component Value Date   FERRITIN 1,834 (H) 05/20/2019    Urinalysis    Component Value Date/Time   COLORURINE YELLOW 05/16/2019 1857   APPEARANCEUR CLEAR 05/16/2019 1857   LABSPEC <1.005 (L) 05/16/2019 1857   PHURINE 6.5 05/16/2019 1857   GLUCOSEU NEGATIVE 05/16/2019 1857   HGBUR NEGATIVE 05/16/2019 1857   HGBUR negative 03/23/2007 0000   BILIRUBINUR NEGATIVE 05/16/2019 1857   BILIRUBINUR neg 11/11/2016 [place] 05/16/2019 1857   PROTEINUR NEGATIVE 05/16/2019 1857   UROBILINOGEN 0.2 11/11/2016 1017   UROBILINOGEN 0.2 11/08/2012 0915   NITRITE NEGATIVE 05/16/2019 1857   LEUKOCYTESUR NEGATIVE 05/16/2019 1857      STUDIES: DG Chest 2 View  Result Date: 07/01/2019 CLINICAL DATA:  Cough for 2 weeks. Shortness of breath. History of COVID-19. EXAM: CHEST - 2 VIEW COMPARISON:  Chest x-ray dated 05/21/2019 and 04/24/2019 and CT scan of the chest dated 05/17/2019 FINDINGS: Power port in good position unchanged. There are persistent peripheral bilateral pulmonary infiltrates, slightly accentuated on the right and unchanged on the left. Heart size and pulmonary vascularity are normal. No effusions. No bone abnormality. IMPRESSION: Persistent bilateral pulmonary infiltrates, slightly accentuated on the right. Electronically Signed   By: Lorriane Shire M.D.  On: 07/01/2019 15:44   NM PET Image Restag (PS) Skull Base To Thigh  Result Date: 06/14/2019 CLINICAL DATA:  Subsequent treatment strategy for Hodgkin's lymphoma. EXAM: NUCLEAR MEDICINE PET SKULL BASE TO THIGH TECHNIQUE: 9.03 mCi F-18 FDG was injected intravenously. Full-ring PET imaging was performed from the skull base to thigh after the radiotracer. CT data was obtained and used for attenuation correction and anatomic localization. Fasting blood glucose: 170 mg/dl COMPARISON:  PET-CT 04/03/2019. FINDINGS: Mediastinal blood pool activity: SUV max 2.1 Liver activity: SUV max 3.2 NECK: No hypermetabolic lymph nodes in the neck. In addition, previously noted right supraclavicular lymph node has significantly decreased in size, currently measuring 1.2 cm in short axis (axial image 54 of series 4) and is no longer hypermetabolic (SUVmax = 1.7 versus 12.3 on the prior)) . Incidental CT findings: none CHEST: No hypermetabolic mediastinal or hilar nodes. Previously noted enlarged right paratracheal lymph node has decreased in size and currently measures 2.3 cm in short axis (axial image 65 of series 4) and is no longer hypermetabolic (SUVmax = 2.2 versus 11.7 on the prior)) . No suspicious pulmonary nodules on the CT scan. Incidental CT findings: Left internal jugular  single-lumen porta cath with tip terminating in the superior aspect of the right atrium. Aortic atherosclerosis. ABDOMEN/PELVIS: No abnormal hypermetabolic activity within the liver, pancreas, adrenal glands, or spleen. No hypermetabolic lymph nodes in the abdomen or pelvis. Incidental CT findings: none SKELETON: Mild diffuse hypermetabolism throughout the visualized skeletal structures without destructive osseous lesions, most likely to reflect rebound marrow response. No focal hypermetabolic activity to suggest skeletal metastasis. Bilateral hip arthroplasties. Incidental CT findings: none IMPRESSION: 1. Complete metabolic response to therapy ([place] 1), as above. 2. Additional incidental findings, as above. Electronically Signed   By: Vinnie Langton M.D.   On: 06/14/2019 11:00   DG Chest Port 1 View  Result Date: 07/08/2019 CLINICAL DATA:  Shortness of breath. COVID-19 positive. Hodgkin's lymphoma. EXAM: PORTABLE CHEST 1 VIEW COMPARISON:  July 08, 2019 chest radiograph and PET-CT June 14, 2019 FINDINGS: Port-A-Cath tip is in the superior vena cava near the cavoatrial junction level. No pneumothorax. There is patchy airspace opacity throughout the lungs bilaterally with small pleural effusions bilaterally. Heart size and pulmonary vascularity are normal. No adenopathy appreciable by radiography. There are surgical clips overlying the right supraclavicular region. No bone lesions evident. IMPRESSION: Widespread patchy airspace opacity noted bilaterally. Question multifocal pneumonia. A degree of underlying fibrosis is apparent on PET study. Suspect superimposed multifocal pneumonia. Small pleural effusions bilaterally. Stable cardiac silhouette. Electronically Signed   By: Lowella Grip III M.D.   On: 07/08/2019 12:59   DG Abd 2 Views  Result Date: 07/01/2019 CLINICAL DATA:  Abdominal cramping. EXAM: ABDOMEN - 2 VIEW COMPARISON:  Scout image for CT scan of the abdomen dated 02/28/2020 FINDINGS:  There is air scattered throughout nondistended loops of large and small bowel with a small amount of air in the stomach. No visible free air or free fluid. No significant acute bone abnormality. Bilateral total hip prostheses. Bilateral pulmonary infiltrates are visible at the lung bases. IMPRESSION: Benign-appearing abdomen. Bilateral pulmonary infiltrates. Electronically Signed   By: Lorriane Shire M.D.   On: 07/01/2019 15:46    ASSESSMENT: 64 y.o. 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 Greenup is currently day 22 cycle 5 of his chemotherapy.  The effect of his Neulasta (OnPro) therefore should have cleared and his neutrophilia is likely reactive to his current infection.  Except for the lack of fever and productive sputum, community-acquired pneumonia has to be #1 on the differential and this is being appropriately treated with vancomycin cefepime and azithromycin.  The patient is immunocompromised given his baseline lymphoma and its treatment even though his counts right now are not low.  Accordingly atypical/unusual pneumonias are in the differential.  In addition he may not have been able to completely clear his COVID-19 infection.  This is also the type of patients who may develop a variant coronavirus. I would consider consultation with pulmonary given this complex scenario.  Note that his chemotherapy did not include bleomycin so we are not dealing with bleomycin  toxicity.  Reaction to brentuximab which is a relatively new immune a conjugate is remotely possible.  It more commonly causes cough and peripheral edema.  Also the patient has received doxorubicin, and again there is a remote possibility of congestive heart failure due to that medication, especially given the repeat infusions he has been receiving recently for low blood pressure noted at his most recent visit with Dr. Maylon Peppers 07/01/2019.  Note that patient has an excellent chance of cure of his Hodgkin's lymphoma.  I will make sure Dr Maylon Peppers is aware of patient's admission. Greatly appreciate your care of this patient.     Chauncey Cruel, MD   07/08/2019 6:37 PM [facility] [address] [place], [place] [address] Tel. [phone]    Fax. [phone]

## 2019-07-08 NOTE — Progress Notes (Signed)
   07/08/19 2142  Oxygen Therapy  SpO2 (!) 59 %   Removed NRB for patient to take a sip of water and wipe his nose, and desaturated to 59%. Placed back on NRB and recovered within a few minutes. Jody, RN from [facility] present during desaturation.

## 2019-07-08 NOTE — ED Provider Notes (Signed)
[place] DEPT Provider Note   CSN: [id number] Arrival date & time: 07/08/19  1153     History No chief complaint on file.   Steve [name] is a 64 y.o. male.  HPI He presents for evaluation of gradually worse trouble breathing, requiring him to use more oxygen.  He has been on oxygen since discharge from the hospital after treatment for Covid, about 4 weeks ago.  He denies chest pain, nausea, vomiting, weakness or dizziness.  This morning his oxygen saturations were low when he tried to check them, and EMS was called.  They found him with sats unobtainable, until they put him on nonrebreather with high flow oxygen.  Patient reports occasional cough, initially nonproductive but now producing sputum.  He has been using increasingly more oxygen at home, by nasal cannula.  Over the last week he had to go to the chemotherapy infusion center for IV fluids, because he was "dehydrated."  He states that his blood pressure was low, causing the oncology doctors to treat him with IV fluids.  He denies fever, chest pain, focal weakness or paresthesia.  Last chemotherapy was about 3 weeks ago.  There are no other known modifying factors.    Past Medical History:  Diagnosis Date  . Abscess of muscle 08/10/2011   staph infection of right hip   . Diabetes mellitus without complication ([place])   . GERD (gastroesophageal reflux disease)    rare reflux - no meds for reflux - NO PROBLEM IN PAST SEVERAL YRS  . Hip dysplasia, congenital    no surgery as a child for hip dysplasia - has had bilateral hip replacements as an adult  . Hodgkin lymphoma ([place])   . Hypertension   . Pancreatitis   . Postoperative anemia due to acute blood loss 09/07/2012  . Septic arthritis of hip ([place]) 09/05/2012   PT'S TOTAL HIP JOINT REMOVED - ANTIBIOTIC SPACE PLACED AND PT HAS FINISHED IV ANTIBIOTICS ( PICC LINE REMOVED)  . Sleep apnea    USES CPAP    Patient Active Problem List   Diagnosis  Date Noted  . Febrile neutropenia ([place]) 05/17/2019  . COVID-19 05/17/2019  . Anemia 05/17/2019  . Hyponatremia 05/17/2019  . Protein-calorie malnutrition, severe ([place]) 05/17/2019  . Neutropenic fever ([place]) 05/16/2019  . 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 Chemotherapy-induced diarrhea 04/22/2019  . Anemia due to antineoplastic chemotherapy 03/25/2019  . Hodgkin lymphoma ([place]) 02/25/2019  . Abnormal CT scan, pancreas - tail area 01/31/2013  . 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 Septic arthritis of hip ([place]) 09/05/2012  . Tick bite of abdomen 11/09/2011  . Routine general medical examination at a health care facility 09/12/2011  . Elbow pain 09/12/2011  . S/P PICC central line placement 08/10/2011  . Abscess of muscle 08/10/2011  . H/O dental abscess 08/10/2011  . Staphylococcus aureus bacteremia 07/27/2011  . Elevated BP 09/07/2010  . Abdominal pain, RLQ 09/02/2010  . HYPOGONADISM, MALE 03/23/2007  . IMPAIRED GLUCOSE TOLERANCE 03/23/2007  . SLEEP APNEA 03/23/2007    Past Surgical History:  Procedure Laterality Date  . COLONOSCOPY  04/26/2007  . HERNIA REPAIR     inguinal hernia x3  . IR IMAGING GUIDED PORT INSERTION  03/29/2019  . JOINT REPLACEMENT  2002 & 2007   bilateral hip replacement  . LYMPH NODE BIOPSY Right 03/20/2019   Procedure: DEEP RIGHT SUPRACLAVICULAR LYMPH NODE EXCISION;  Surgeon: Fanny Skates, MD;  Location: [facility];  Service: General;  Laterality: Right;  . MULTIPLE EXTRACTIONS WITH ALVEOLOPLASTY  07/28/2011   Procedure: MULTIPLE EXTRACION WITH ALVEOLOPLASTY;  Surgeon: Lenn Cal, DDS;  Location: [facility];  Service: Oral Surgery;  Laterality: N/A;  Extraction of tooth #'s 2,3,4,5,6,11,12,13,15,19,22 with alveoloplasty.  . shoulder repair - right for separation of shoulder    . TEE WITHOUT CARDIOVERSION  07/29/2011   Procedure: TRANSESOPHAGEAL ECHOCARDIOGRAM (TEE);  Surgeon: Josue Hector, MD;  Location: [place];  Service: Cardiovascular;  Laterality: N/A;  . TOTAL HIP REVISION Right 09/05/2012   Procedure: RIGHT HIP RESECTION ARTHROPLASTY WITH ANTIBIOTIC SPACERS;  Surgeon: [name], MD;  Location: [facility];  Service: Orthopedics;  Laterality: Right;  . TOTAL HIP REVISION Right 11/30/2012   Procedure: RIGHT TOTAL HIP ARTHROPLASTY REIMPLANTATION;  Surgeon: [name], MD;  Location: [facility];  Service: Orthopedics;  Laterality: Right;       Family History  Problem Relation Age of Onset  . Melanoma Mother   . Heart attack Mother   . Hyperlipidemia Neg Hx   . Sudden death Neg Hx   . Hypertension Neg Hx   . Diabetes Neg Hx     Social History   Tobacco Use  . Smoking status: Never Smoker  . Smokeless tobacco: Never Used  Substance Use Topics  . Alcohol use: Yes    Comment: rare beer. 5 times a year at most. 2 beers when he drinks.  . Drug use: No    Home Medications Prior to Admission medications   Medication Sig Start Date End Date Taking? Authorizing Provider  atorvastatin (LIPITOR) 10 MG tablet TAKE 1 TABLET BY MOUTH EVERY DAY Patient taking differently: Take 10 mg by mouth daily.  04/16/19  Yes Saguier, Percell Miller, PA-C  dexamethasone (DECADRON) 4 MG tablet TAKE 2 TABLETS BY MOUTH ONCE A DAY FOR 3 DAYS AFTER CHEMO. TAKE WITH FOOD. Patient taking differently: Take 8 mg by mouth See admin instructions. Take 8mg  by mouth once a day for 3 days after chemo. Take with food. 05/22/19  Yes Tish Men, MD  lidocaine-prilocaine (EMLA) cream Apply to affected area once Patient taking differently: Apply 1 application topically daily as needed. Port. Apply to affected area once 03/25/19  Yes Tish Men, MD  LORazepam (ATIVAN) 0.5 MG tablet TAKE 1 TABLET (0.5 MG TOTAL) BY MOUTH EVERY 6 (SIX) HOURS AS NEEDED (NAUSEA OR VOMITING). 04/22/19  Yes Tish Men, MD  metFORMIN (GLUCOPHAGE) 500 MG tablet TAKE 1 TABLET BY MOUTH TWICE A DAY WITH MEALS Patient taking differently: Take 500 mg by mouth 2  (two) times daily with a meal.  02/12/19  Yes Saguier, Percell Miller, PA-C  Multiple Vitamin (MULTIVITAMIN WITH MINERALS) TABS tablet Take 2 tablets by mouth daily. flinstones   Yes [provider]  naproxen sodium (ALEVE) 220 MG tablet Take 440 mg by mouth 2 (two) times daily as needed (pain).   Yes [provider]  ondansetron (ZOFRAN) 8 MG tablet TAKE 1 TAB BY MOUTH 2 (TWO) TIMES DAILY AS NEEDED. START ON THE THIRD DAY AFTER CHEMOTHERAPY. Patient taking differently: Take 8 mg by mouth See admin instructions. 8mg  twice daily as needed for nausea/vomitting on the third day after chemo 05/22/19  Yes Tish Men, MD  prochlorperazine (COMPAZINE) 10 MG tablet Take 1 tablet (10 mg total) by mouth every 6 (six) hours as needed (Nausea or vomiting). 06/12/19  Yes Tish Men, MD  sildenafil (VIAGRA) 100 MG tablet Take 0.5-1 tablets (50-100 mg total) by mouth daily as needed for erectile  dysfunction. 06/13/17  Yes Saguier, Percell Miller, PA-C  sodium chloride (OCEAN) 0.65 % SOLN nasal spray Place 1 spray into both nostrils as needed for congestion.   Yes [provider]  ferrous sulfate 325 (65 FE) MG tablet Take 1 tablet (325 mg total) by mouth daily with breakfast. Patient not taking: Reported on 07/08/2019 05/22/19   Bonnielee Haff, MD  guaiFENesin-dextromethorphan [facility] DM) 100-10 MG/5ML syrup Take 5 mLs by mouth every 4 (four) hours as needed for cough. Patient not taking: Reported on 07/08/2019 04/19/19   Tish Men, MD    Allergies    Patient has no known allergies.  Review of Systems   Review of Systems  Physical Exam Updated Vital Signs BP 122/69   Pulse 93   Temp 98.2 F (36.8 C) (Oral)   Resp (!) 40   Ht 5\' 6"  (1.676 m)   Wt 74.4 kg   SpO2 94%   BMI 26.47 kg/m   Physical Exam  ED Results / Procedures / Treatments   Labs (all labs ordered are listed, but only abnormal results are displayed) Labs Reviewed  COMPREHENSIVE METABOLIC PANEL - Abnormal; Notable for the  following components:      Result Value   Glucose, Bld 185 (*)    Calcium 8.4 (*)    Albumin 2.4 (*)    All other components within normal limits  CBC WITH DIFFERENTIAL/PLATELET - Abnormal; Notable for the following components:   WBC 37.6 (*)    RBC 3.13 (*)    Hemoglobin 8.9 (*)    HCT 28.6 (*)    RDW 20.0 (*)    Neutro Abs 33.4 (*)    Monocytes Absolute 1.5 (*)    Basophils Absolute 0.2 (*)    Abs Immature Granulocytes 0.76 (*)    All other components within normal limits  BLOOD GAS, ARTERIAL - Abnormal; Notable for the following components:   pH, Arterial 7.473 (*)    pO2, Arterial 64.5 (*)    Acid-Base Excess 3.5 (*)    All other components within normal limits  I-STAT CHEM 8, ED - Abnormal; Notable for the following components:   Glucose, Bld 177 (*)    Calcium, Ion 1.09 (*)    Hemoglobin 9.5 (*)    HCT 28.0 (*)    All other components within normal limits  CULTURE, BLOOD (ROUTINE X 2)  CULTURE, BLOOD (ROUTINE X 2)  URINE CULTURE  LACTIC ACID, PLASMA  LACTIC ACID, PLASMA  URINALYSIS, ROUTINE W REFLEX MICROSCOPIC    EKG EKG Interpretation  Date/Time:  Monday July 08 2019 12:30:47 EDT Ventricular Rate:  110 PR Interval:    QRS Duration: 99 QT Interval:  356 QTC Calculation: 480 R Axis:   12 Text Interpretation: Sinus tachycardia Low voltage, extremity and precordial leads Borderline ST elevation, inferior leads Borderline prolonged QT interval Baseline wander since last tracing no significant change Confirmed by Daleen Bo [phone]) on 07/08/2019 12:37:45 PM   Radiology DG Chest Port 1 View  Result Date: 07/08/2019 CLINICAL DATA:  Shortness of breath. COVID-19 positive. Hodgkin's lymphoma. EXAM: PORTABLE CHEST 1 VIEW COMPARISON:  July 08, 2019 chest radiograph and PET-CT June 14, 2019 FINDINGS: Port-A-Cath tip is in the superior vena cava near the cavoatrial junction level. No pneumothorax. There is patchy airspace opacity throughout the lungs bilaterally  with small pleural effusions bilaterally. Heart size and pulmonary vascularity are normal. No adenopathy appreciable by radiography. There are surgical clips overlying the right supraclavicular region. No bone lesions evident. IMPRESSION: Widespread  patchy airspace opacity noted bilaterally. Question multifocal pneumonia. A degree of underlying fibrosis is apparent on PET study. Suspect superimposed multifocal pneumonia. Small pleural effusions bilaterally. Stable cardiac silhouette. Electronically Signed   By: Lowella Grip III M.D.   On: 07/08/2019 12:59    Procedures .Critical Care Performed by: Daleen Bo, MD Authorized by: Daleen Bo, MD   Critical care provider statement:    Critical care time (minutes):  35   Critical care start time:  07/08/2019 12:30 PM   Critical care end time:  07/08/2019 5:09 PM   Critical care time was exclusive of:  Separately billable procedures and treating other patients   Critical care was necessary to treat or prevent imminent or life-threatening deterioration of the following conditions:  Respiratory failure   Critical care was time spent personally by me on the following activities:  Blood draw for specimens, development of treatment plan with patient or surrogate, discussions with consultants, evaluation of patient's response to treatment, examination of patient, obtaining history from patient or surrogate, ordering and performing treatments and interventions, ordering and review of laboratory studies, pulse oximetry, re-evaluation of patient's condition, review of old charts and ordering and review of radiographic studies   (including critical care time)  Medications Ordered in ED Medications  alteplase (CATHFLO ACTIVASE) injection 2 mg (has no administration in time range)  lactated ringers bolus 1,000 mL (0 mLs Intravenous Stopped 07/08/19 1630)    And  lactated ringers bolus 1,000 mL (0 mLs Intravenous Stopped 07/08/19 1630)    And  lactated  ringers bolus 250 mL (250 mLs Intravenous New Bag/Given 07/08/19 1637)  ceFEPIme (MAXIPIME) 2 g in sodium chloride 0.9 % 100 mL IVPB (0 g Intravenous Stopped 07/08/19 1631)  vancomycin (VANCOREADY) IVPB 1500 mg/300 mL (0 mg Intravenous Stopped 07/08/19 1631)    ED Course  I have reviewed the triage vital signs and the nursing notes.  Pertinent labs & imaging results that were available during my care of the patient were reviewed by me and considered in my medical decision making (see chart for details).  Clinical Course as of Jul 08 1726  Mon Jul 08, 2019  1240 Worsening shortness of breath, differential diagnosis includes PE, likely intrinsic, without leg swelling.  Will obtain CT angio, after assessing creatinine level.   [EW]  1309 At this time he is fairly comfortable, oxygen saturation 93%, still on facemask oxygen at 15 L.  At this time he states he has been coughing and producing some mucus recently.  He denies fever or chills.  Code sepsis initiated.   [EW]  1639 Normal  Lactic acid, plasma [EW]  1639 Normal except glucose high, calcium low, albumin low   [EW]  1639 Normal except glucose high, calcium low, hemoglobin low  I-stat chem 8, ED (not at [facility] or [facility])(!) [EW]  1640 Normal except white count high, hemoglobin low  CBC with Differential(!) [EW]  1640 Arterial blood gas with slightly elevated pH, low PO2, increased acid-base  Blood gas, arterial(!) [EW]  1658 Case discussed with oncology, Dr. Jana Hakim, who will see the patient as a consultant and agrees with hospitalization.  He states the differential diagnosis for the findings today include allergic reaction, bacterial infection, fluid overload.  Note that the patient is receiving chemotherapy and actively, last, 4 days ago.   [EW]  1728 Case discussed with critical care who state they will evaluate the patient, in the ED, to determine if they should admit versus hospitalist.   [EW]  Clinical Course User Index [EW]  Daleen Bo, MD   MDM Rules/Calculators/A&P                       Patient Vitals for the past 24 hrs:  BP Temp Temp src Pulse Resp SpO2 Height Weight  07/08/19 1715 122/69 -- -- 93 (!) 40 94 % -- --  07/08/19 1700 116/78 -- -- 91 (!) 26 100 % -- --  07/08/19 1645 112/85 -- -- 91 (!) 37 100 % -- --  07/08/19 1630 117/72 -- -- 89 (!) 22 94 % -- --  07/08/19 1615 113/75 -- -- 88 (!) 43 92 % -- --  07/08/19 1600 114/78 -- -- 82 (!) 36 94 % -- --  07/08/19 1545 118/80 -- -- 86 (!) 27 92 % -- --  07/08/19 1530 117/71 -- -- 90 (!) 32 96 % -- --  07/08/19 1515 119/76 -- -- -- (!) 36 92 % -- --  07/08/19 1500 117/83 -- -- 88 (!) 21 94 % -- --  07/08/19 1445 118/81 -- -- 91 (!) 47 95 % -- --  07/08/19 1430 106/70 -- -- 90 (!) 28 97 % -- --  07/08/19 1415 114/75 -- -- -- (!) 29 95 % -- --  07/08/19 1400 116/79 -- -- -- (!) 34 94 % -- --  07/08/19 1359 -- -- -- -- -- -- 5\' 6"  (1.676 m) 74.4 kg  07/08/19 1345 121/82 -- -- 100 (!) 28 96 % -- --  07/08/19 1330 116/77 -- -- 100 (!) 51 95 % -- --  07/08/19 1315 115/81 -- -- (!) 102 (!) 43 95 % -- --  07/08/19 1300 116/61 -- -- (!) 104 (!) 33 93 % -- --  07/08/19 1223 98/64 98.2 F (36.8 C) Oral (!) 108 (!) 32 (!) 85 % 5\' 6"  (1.676 m) 74.4 kg    5:07 PM Reevaluation with update and discussion. After initial assessment and treatment, an updated evaluation reveals he is comfortable but still on facemask oxygen, for support.  No JVD.  Lungs with scattered rhonchi no rales and mildly diminished air movement bilaterally.  He is speaking in complete sentences, and has not respiratory distress.  Findings discussed with the patient.  He states he is hungry.  All questions answered. Daleen Bo   Medical Decision Making: Patient presenting with shortness of breath and new increased oxygen requirement, following recent COVID-19 infection.  Chest x-ray consistent with multifocal pneumonia.  He is currently receiving chemotherapy for Hodgkin's lymphoma.   Last saw his oncologist, about 3 weeks ago.  At that time he was apparently having good response to treatment and another 4 cycles were planned. Chest x-ray, shows worsening pulmonary infiltrates over the last week.  Exact cause for infiltrate is not clear.  Last chemotherapy, was 3 weeks ago.  Patient treated with lactate Ringer's fluid bolus, and empiric antibiotics.  Last cardiac echo, December 2021, with normal left ventricular ejection fraction.  Doubt CHF.  Patient requires hospitalization for further care and treatment.  Doubt exacerbation of Covid infection, which was 2 months ago.  Steve [name] was evaluated in Emergency Department on 07/08/2019 for the symptoms described in the history of present illness. He was evaluated in the context of the global COVID-19 pandemic, which necessitated consideration that the patient might be at risk for infection with the SARS-CoV-2 virus that causes COVID-19. Institutional protocols and algorithms that pertain to the evaluation of patients at risk for  COVID-19 are in a state of rapid change based on information released by regulatory bodies including the CDC and federal and state organizations. These policies and algorithms were followed during the patient's care in the ED.   CRITICAL CARE- Yes Performed by: Daleen Bo   Nursing Notes Reviewed/ Care Coordinated Applicable Imaging Reviewed Interpretation of Laboratory Data incorporated into ED treatment  5:28 PM-plan is to have intensivist service evaluate patient, prior to deciding on intensivist admit versus hospitalist admit.    Final Clinical Impression(s) / ED Diagnoses Final diagnoses:  HCAP (healthcare-associated pneumonia)  Hypoxia  Non-Hodgkin's lymphoma, unspecified body region, unspecified non-Hodgkin lymphoma type [facility])    Rx / DC Orders ED Discharge Orders    None       Daleen Bo, MD 07/08/19 1729

## 2019-07-09 ENCOUNTER — Inpatient Hospital Stay (HOSPITAL_COMMUNITY): Payer: BC Managed Care – PPO

## 2019-07-09 ENCOUNTER — Encounter: Payer: Self-pay | Admitting: *Deleted

## 2019-07-09 DIAGNOSIS — R918 Other nonspecific abnormal finding of lung field: Secondary | ICD-10-CM | POA: Diagnosis not present

## 2019-07-09 DIAGNOSIS — R0602 Shortness of breath: Secondary | ICD-10-CM

## 2019-07-09 DIAGNOSIS — Z79899 Other long term (current) drug therapy: Secondary | ICD-10-CM

## 2019-07-09 DIAGNOSIS — R0689 Other abnormalities of breathing: Secondary | ICD-10-CM | POA: Diagnosis not present

## 2019-07-09 DIAGNOSIS — E119 Type 2 diabetes mellitus without complications: Secondary | ICD-10-CM

## 2019-07-09 DIAGNOSIS — J189 Pneumonia, unspecified organism: Secondary | ICD-10-CM

## 2019-07-09 DIAGNOSIS — J9601 Acute respiratory failure with hypoxia: Secondary | ICD-10-CM

## 2019-07-09 DIAGNOSIS — B370 Candidal stomatitis: Secondary | ICD-10-CM | POA: Diagnosis not present

## 2019-07-09 DIAGNOSIS — J9600 Acute respiratory failure, unspecified whether with hypoxia or hypercapnia: Secondary | ICD-10-CM | POA: Diagnosis not present

## 2019-07-09 DIAGNOSIS — R739 Hyperglycemia, unspecified: Secondary | ICD-10-CM

## 2019-07-09 DIAGNOSIS — C8190 Hodgkin lymphoma, unspecified, unspecified site: Secondary | ICD-10-CM

## 2019-07-09 DIAGNOSIS — C8111 Nodular sclerosis classical Hodgkin lymphoma, lymph nodes of head, face, and neck: Secondary | ICD-10-CM | POA: Diagnosis not present

## 2019-07-09 DIAGNOSIS — R002 Palpitations: Secondary | ICD-10-CM

## 2019-07-09 DIAGNOSIS — R0902 Hypoxemia: Secondary | ICD-10-CM | POA: Diagnosis not present

## 2019-07-09 DIAGNOSIS — Z8701 Personal history of pneumonia (recurrent): Secondary | ICD-10-CM

## 2019-07-09 LAB — CBC
HCT: 24.6 % — ABNORMAL LOW (ref 39.0–52.0)
Hemoglobin: 7.7 g/dL — ABNORMAL LOW (ref 13.0–17.0)
MCH: 29.2 pg (ref 26.0–34.0)
MCHC: 31.3 g/dL (ref 30.0–36.0)
MCV: 93.2 fL (ref 80.0–100.0)
Platelets: 231 10*3/uL (ref 150–400)
RBC: 2.64 MIL/uL — ABNORMAL LOW (ref 4.22–5.81)
RDW: 20.2 % — ABNORMAL HIGH (ref 11.5–15.5)
WBC: 35.1 10*3/uL — ABNORMAL HIGH (ref 4.0–10.5)
nRBC: 0.3 % — ABNORMAL HIGH (ref 0.0–0.2)

## 2019-07-09 LAB — URINE CULTURE: Culture: NO GROWTH

## 2019-07-09 LAB — ECHOCARDIOGRAM COMPLETE
Height: 66 in
Weight: 2624.36 oz

## 2019-07-09 LAB — COMPREHENSIVE METABOLIC PANEL
ALT: 13 U/L (ref 0–44)
AST: 23 U/L (ref 15–41)
Albumin: 2 g/dL — ABNORMAL LOW (ref 3.5–5.0)
Alkaline Phosphatase: 77 U/L (ref 38–126)
Anion gap: 9 (ref 5–15)
BUN: 23 mg/dL (ref 8–23)
CO2: 25 mmol/L (ref 22–32)
Calcium: 8 mg/dL — ABNORMAL LOW (ref 8.9–10.3)
Chloride: 107 mmol/L (ref 98–111)
Creatinine, Ser: 0.82 mg/dL (ref 0.61–1.24)
GFR calc Af Amer: >60 mL/min (ref >60)
GFR calc non Af Amer: >60 mL/min (ref >60)
Glucose, Bld: 167 mg/dL — ABNORMAL HIGH (ref 70–99)
Potassium: 3.7 mmol/L (ref 3.5–5.1)
Sodium: 141 mmol/L (ref 135–145)
Total Bilirubin: 1 mg/dL (ref 0.3–1.2)
Total Protein: 6.3 g/dL — ABNORMAL LOW (ref 6.5–8.1)

## 2019-07-09 LAB — HEMOGLOBIN A1C
Hgb A1c MFr Bld: 7.3 % — ABNORMAL HIGH (ref 4.8–5.6)
Mean Plasma Glucose: 162.81 mg/dL

## 2019-07-09 LAB — PROCALCITONIN: Procalcitonin: 3.09 ng/mL

## 2019-07-09 LAB — GLUCOSE, CAPILLARY
Glucose-Capillary: 142 mg/dL — ABNORMAL HIGH (ref 70–99)
Glucose-Capillary: 143 mg/dL — ABNORMAL HIGH (ref 70–99)
Glucose-Capillary: 214 mg/dL — ABNORMAL HIGH (ref 70–99)
Glucose-Capillary: 215 mg/dL — ABNORMAL HIGH (ref 70–99)

## 2019-07-09 LAB — PHOSPHORUS: Phosphorus: 5 mg/dL — ABNORMAL HIGH (ref 2.5–4.6)

## 2019-07-09 LAB — STREP PNEUMONIAE URINARY ANTIGEN: Strep Pneumo Urinary Antigen: NEGATIVE

## 2019-07-09 LAB — MAGNESIUM: Magnesium: 1.7 mg/dL (ref 1.7–2.4)

## 2019-07-09 MED ORDER — MAGNESIUM SULFATE 2 GM/50ML IV SOLN
2.0000 g | Freq: Once | INTRAVENOUS | Status: AC
  Administered 2019-07-09: 2 g via INTRAVENOUS
  Filled 2019-07-09: qty 50

## 2019-07-09 MED ORDER — INSULIN ASPART 100 UNIT/ML ~~LOC~~ SOLN
3.0000 [IU] | Freq: Three times a day (TID) | SUBCUTANEOUS | Status: DC
  Administered 2019-07-09 – 2019-07-10 (×3): 3 [IU] via SUBCUTANEOUS

## 2019-07-09 MED ORDER — PRO-STAT SUGAR FREE PO LIQD
30.0000 mL | Freq: Two times a day (BID) | ORAL | Status: DC
  Administered 2019-07-09 – 2019-07-17 (×11): 30 mL via ORAL
  Filled 2019-07-09 (×11): qty 30

## 2019-07-09 MED ORDER — LIP MEDEX EX OINT
TOPICAL_OINTMENT | CUTANEOUS | Status: DC | PRN
  Administered 2019-08-09 – 2019-08-10 (×2): 1 via TOPICAL
  Filled 2019-07-09 (×2): qty 7

## 2019-07-09 MED ORDER — FUROSEMIDE 10 MG/ML IJ SOLN
40.0000 mg | Freq: Once | INTRAMUSCULAR | Status: AC
  Administered 2019-07-09: 40 mg via INTRAVENOUS
  Filled 2019-07-09: qty 4

## 2019-07-09 MED ORDER — POTASSIUM CHLORIDE CRYS ER 20 MEQ PO TBCR
40.0000 meq | EXTENDED_RELEASE_TABLET | ORAL | Status: AC
  Administered 2019-07-09: 40 meq via ORAL
  Filled 2019-07-09 (×2): qty 2

## 2019-07-09 NOTE — Progress Notes (Signed)
  Echocardiogram 2D Echocardiogram has been performed.  Randa Lynn Sharron Petruska 07/09/2019, 9:56 AM

## 2019-07-09 NOTE — Progress Notes (Signed)
RT Note: Pt. placed on HFNC after RN made RT aware of desaturation episode earlier this shift, during transition to 15 lpm HFNC pt. desaturated to mid to low 80's and required 12 lpm NRB mask to be placed over HFNC to obtain sats > 92%, pt. has previous ABG on file from yesterday.

## 2019-07-09 NOTE — Progress Notes (Signed)
Bilateral lower extremity venous duplex has been completed. Preliminary results can be found in CV Proc through chart review.   07/09/19 9:32 AM Steve [name] RVT

## 2019-07-09 NOTE — Plan of Care (Signed)
Reviewed IS and flutter machine with patient. After each use patient has a strong cough but non-productive at this time. Patient is resting well on heated HFNC. Will continue to monitor.  Problem: Education: Goal: Knowledge of General Education information will improve Description: Including pain rating scale, medication(s)/side effects and non-pharmacologic comfort measures Outcome: Progressing   Problem: Health Behavior/Discharge Planning: Goal: Ability to manage health-related needs will improve Outcome: Progressing   Problem: Clinical Measurements: Goal: Ability to maintain clinical measurements within normal limits will improve Outcome: Progressing Goal: Will remain free from infection Outcome: Progressing Goal: Diagnostic test results will improve Outcome: Progressing Goal: Respiratory complications will improve Outcome: Progressing Goal: Cardiovascular complication will be avoided Outcome: Progressing   Problem: Activity: Goal: Risk for activity intolerance will decrease Outcome: Progressing   Problem: Nutrition: Goal: Adequate nutrition will be maintained Outcome: Progressing   Problem: Coping: Goal: Level of anxiety will decrease Outcome: Progressing   Problem: Elimination: Goal: Will not experience complications related to bowel motility Outcome: Progressing Goal: Will not experience complications related to urinary retention Outcome: Progressing   Problem: Pain Managment: Goal: General experience of comfort will improve Outcome: Progressing   Problem: Safety: Goal: Ability to remain free from injury will improve Outcome: Progressing   Problem: Skin Integrity: Goal: Risk for impaired skin integrity will decrease Outcome: Progressing

## 2019-07-09 NOTE — Progress Notes (Signed)
Patient was a no show to yesterday's appointment and when called, RN found that the patient needed emergent help. Patient was brought to the ED via EMS and admitted to the hospital. Will follow up once patient has been discharged. Currently scheduled for follow up in this office 07/22/19.

## 2019-07-09 NOTE — Consult Note (Signed)
Date of Admission:  07/08/2019          Reason for Consult: Diffuse pulmonary infiltrates in immunocompromised patient   Referring Provider: Dr. Loletta Specter   Assessment:  1. Diffuse pulmonary infiltrates with acute respiratory failure in patient with hx of 2. Stage III Hodgkin's Lymphoma on chemotherapy with  3. Adcetris (brentuximab vedotin, doxorubicin, vinblastine, dacarbazine) 4. Hx of COVID pneumonia in February with O2 requirement thereafter 5. Thrush  Plan:  1. Check LDH and if high  Would start treatment for PCP pneumonia with IV bactrim and steroids 2. If we go to TMP/SMX we can get rid of bactrim but would keep ceftriaxone and azithromycin for now 3. Fu legionella ag, urine 4. Fu histoplasma ag urine, blastomyces, fungitell (serum) though I doubt this is due to a dimorphic fungus it is possible 5. Continue fluconazole 100mg  daily  Active Problems:   * No active hospital problems. *   Scheduled Meds: . atorvastatin  10 mg Oral Daily  . Chlorhexidine Gluconate Cloth  6 each Topical Daily  . enoxaparin (LOVENOX) injection  40 mg Subcutaneous Q24H  . feeding supplement (ENSURE ENLIVE)  237 mL Oral BID BM  . feeding supplement (PRO-STAT SUGAR FREE 64)  30 mL Oral BID  . ferrous sulfate  325 mg Oral Q breakfast  . fluconazole  100 mg Oral Daily  . insulin aspart  0-9 Units Subcutaneous TID WC  . insulin aspart  3 Units Subcutaneous TID WC  . mouth rinse  15 mL Mouth Rinse BID  . multivitamin with minerals  2 tablet Oral Daily  . pantoprazole (PROTONIX) IV  40 mg Intravenous QHS  . potassium chloride  40 mEq Oral Q4H  . sodium chloride flush  10-40 mL Intracatheter Q12H   Continuous Infusions: . azithromycin 500 mg (07/08/19 1832)  . ceFEPime (MAXIPIME) IV 2 g (07/09/19 1456)  . vancomycin Stopped (07/09/19 1423)   PRN Meds:.acetaminophen, lip balm, magic mouthwash w/lidocaine, ondansetron (ZOFRAN) IV, sodium chloride  HPI: Steve [name] is a 64 y.o. male a  history of DM, Hodgkin's lymphoma (stage III+, diagnosed 02/2019) on chemotherapy with AVD + Adcetris (brentuximab vedotin, doxorubicin, vinblastine, dacarbazine). He follows with Dr. Maylon Peppers in Oncology. His most recent treatment was about 3 weeks ago (06/17/19). Of note he also did have COVID 19 PNA in February of 2021. During that hosptalization he had neutropenic fevers and was also given vancomycin and cefepime. He grew coag neg staph from 1/2 blood cultures = contaminant. He received remdesivir and steroids and ultimately DC to home on home O2.  and has required home O2 at 2L which he apparently has been increasing over  The past several weeks. He came to ER with worsening dyspnea, poor appetite and having had POX in 50% at home. He had odynophagia, and had visible thrush on exam. CT Angiogram showed diffuse multilobar opacities throughout lungs. He has been admitted to the ICU and was started on vancomycin, ceftriaxone and azithromycin along with fluconazole for thrush.   He has had increasing O2 requirements and is in danger of needing mechanical ventilation. Diagnostic bronchoscopy contemplated but his respiratory status is too tenuous at present. Dr. Loletta Specter from [facility] concerned for possibility of PJP pneumonia given appearance and clinical history of progressive worsening dyspnea over several weeks.   He is not producing much in way of sputum at present. I think if we can add on an LDH to his labs and that this is elevated while not specific  for PCP would further support the diagnosis of PCP.  If this is a case I would initiate IV Bactrim along with corticosteroids.  I also think corticosteroids would be a good idea in case this is an autoimmune or drug-induced lung injury.  If we start Bactrim we can get rid of the vancomycin since the Bactrim would cover methicillin-resistant staph aureus.  Of note his PCR was negative for MRSA in the past and and during this hospitalization.  I think infection with a  dimorphic fungus is possible though I do not think that is what is going on with him right now it is reasonable certainly send off histoplasma antigen and Blastomyces antigen.  His serum cryptococcal antigen was negative.  Legionella antigen still pending as well.  In addition to contemplating treatment for PCP I would continue with ceftriaxone and azithromycin.  He has a Legionella antigen pending as well  I will check on him again tomorrow afternoon after my morning clinic     Review of Systems: Review of Systems  Constitutional: Positive for malaise/fatigue. Negative for chills, diaphoresis, fever and weight loss.  HENT: Positive for sore throat. Negative for congestion, hearing loss and tinnitus.   Eyes: Negative for blurred vision and double vision.  Respiratory: Positive for shortness of breath. Negative for cough, sputum production and wheezing.   Cardiovascular: Positive for palpitations. Negative for chest pain and leg swelling.  Gastrointestinal: Negative for abdominal pain, blood in stool, constipation, diarrhea, heartburn, melena, nausea and vomiting.  Genitourinary: Negative for dysuria, flank pain and hematuria.  Musculoskeletal: Negative for back pain, falls, joint pain and myalgias.  Skin: Negative for itching and rash.  Neurological: Negative for dizziness, sensory change, focal weakness, loss of consciousness, weakness and headaches.  Endo/Heme/Allergies: Does not bruise/bleed easily.  Psychiatric/Behavioral: Negative for depression, memory loss and suicidal ideas. The patient is not nervous/anxious.     Past Medical History:  Diagnosis Date  . Abscess of muscle 08/10/2011   staph infection of right hip   . Diabetes mellitus without complication ([place])   . GERD (gastroesophageal reflux disease)    rare reflux - no meds for reflux - NO PROBLEM IN PAST SEVERAL YRS  . Hip dysplasia, congenital    no surgery as a child for hip dysplasia - has had bilateral hip  replacements as an adult  . Hodgkin lymphoma ([place])   . Hypertension   . Pancreatitis   . Postoperative anemia due to acute blood loss 09/07/2012  . Septic arthritis of hip ([place]) 09/05/2012   PT'S TOTAL HIP JOINT REMOVED - ANTIBIOTIC SPACE PLACED AND PT HAS FINISHED IV ANTIBIOTICS ( PICC LINE REMOVED)  . Sleep apnea    USES CPAP    Social History   Tobacco Use  . Smoking status: Never Smoker  . Smokeless tobacco: Never Used  Substance Use Topics  . Alcohol use: Yes    Comment: rare beer. 5 times a year at most. 2 beers when he drinks.  . Drug use: No    Family History  Problem Relation Age of Onset  . Melanoma Mother   . Heart attack Mother   . Hyperlipidemia Neg Hx   . Sudden death Neg Hx   . Hypertension Neg Hx   . Diabetes Neg Hx    No Known Allergies  OBJECTIVE: Blood pressure 96/69, pulse 92, temperature 97.9 F (36.6 C), temperature source Oral, resp. rate (!) 34, height 5\' 6"  (1.676 m), weight 74.4 kg, SpO2 91 %.  Physical Exam  Constitutional:      General: He is not in acute distress.    Appearance: Normal appearance. He is well-developed. He is not ill-appearing or diaphoretic.  HENT:     Head: Normocephalic and atraumatic.     Right Ear: Hearing and external ear normal.     Left Ear: Hearing and external ear normal.     Nose: No nasal deformity or rhinorrhea.     Mouth/Throat:     Comments: Hyperpigmented area on tongue no thrush visible today Eyes:     General: No scleral icterus.    Conjunctiva/sclera: Conjunctivae normal.     Right eye: Right conjunctiva is not injected.     Left eye: Left conjunctiva is not injected.     Pupils: Pupils are equal, round, and reactive to light.  Neck:     Vascular: No JVD.  Cardiovascular:     Rate and Rhythm: Regular rhythm. Tachycardia present.     Heart sounds: Normal heart sounds, S1 normal and S2 normal. No murmur. No friction rub. No gallop.   Pulmonary:     Effort: Tachypnea present.     Breath sounds:  Rhonchi present.  Abdominal:     General: Bowel sounds are normal. There is no distension.     Palpations: Abdomen is soft.     Tenderness: There is no abdominal tenderness.  Musculoskeletal:        General: Normal range of motion.     Right shoulder: Normal.     Left shoulder: Normal.     Cervical back: Normal range of motion and neck supple.     Right hip: Normal.     Left hip: Normal.     Right knee: Normal.     Left knee: Normal.  Lymphadenopathy:     Head:     Right side of head: No submandibular, preauricular or posterior auricular adenopathy.     Left side of head: No submandibular, preauricular or posterior auricular adenopathy.     Cervical: No cervical adenopathy.     Right cervical: No superficial or deep cervical adenopathy.    Left cervical: No superficial or deep cervical adenopathy.  Skin:    General: Skin is warm and dry.     Coloration: Skin is not pale.     Findings: No abrasion, bruising, ecchymosis, erythema, lesion or rash.     Nails: There is no clubbing.  Neurological:     Mental Status: He is alert and oriented to person, place, and time.     Sensory: No sensory deficit.     Coordination: Coordination normal.     Gait: Gait normal.  Psychiatric:        Attention and Perception: He is attentive.        Mood and Affect: Mood normal.        Speech: Speech normal.        Behavior: Behavior normal. Behavior is cooperative.        Thought Content: Thought content normal.        Judgment: Judgment normal.     Lab Results Lab Results  Component Value Date   WBC 35.1 (H) 07/09/2019   HGB 7.7 (L) 07/09/2019   HCT 24.6 (L) 07/09/2019   MCV 93.2 07/09/2019   PLT 231 07/09/2019    Lab Results  Component Value Date   CREATININE 0.82 07/09/2019   BUN 23 07/09/2019   NA 141 07/09/2019   K 3.7 07/09/2019   CL 107 07/09/2019  CO2 25 07/09/2019    Lab Results  Component Value Date   ALT 13 07/09/2019   AST 23 07/09/2019   ALKPHOS 77 07/09/2019    BILITOT 1.0 07/09/2019     Microbiology: Recent Results (from the past 240 hour(s))  Blood Culture (routine x 2)     Status: None (Preliminary result)   Collection Time: 07/08/19  1:20 PM   Specimen: BLOOD  Result Value Ref Range Status   Specimen Description   Final    BLOOD RIGHT ANTECUBITAL Performed at [facility] [address] [place], [place] [address]    Special Requests   Final    BOTTLES DRAWN AEROBIC AND ANAEROBIC Blood Culture adequate volume Performed at [facility] [address] [place], [place] [address]    Culture   Final    NO GROWTH < 24 HOURS Performed at [facility] [address], [place], [place] [address]    Report Status PENDING  Incomplete  Blood Culture (routine x 2)     Status: None (Preliminary result)   Collection Time: 07/08/19  1:25 PM   Specimen: BLOOD  Result Value Ref Range Status   Specimen Description   Final    BLOOD RIGHT ANTECUBITAL Performed at [facility] [address] [place], [place] [address]    Special Requests   Final    BOTTLES DRAWN AEROBIC AND ANAEROBIC Blood Culture adequate volume Performed at [facility] [address], [place], [place] [address]    Culture   Final    NO GROWTH < 24 HOURS Performed at [facility] [address] [place], [place] [address]    Report Status PENDING  Incomplete  MRSA PCR Screening     Status: None   Collection Time: 07/08/19  9:29 PM   Specimen: Nasopharyngeal  Result Value Ref Range Status   MRSA by PCR NEGATIVE NEGATIVE Final    Comment:        The GeneXpert MRSA Assay (FDA approved for NASAL specimens only), is one component of a comprehensive MRSA colonization surveillance program. It is not intended to diagnose MRSA infection nor to guide or monitor treatment for MRSA infections. Performed at [facility], [place] [address] [place], [place] [address]      Alcide Evener, [facility] [phone] pager  07/09/2019, 3:08 PM

## 2019-07-09 NOTE — Progress Notes (Signed)
Initial Nutrition Assessment  DOCUMENTATION CODES:   Non-severe (moderate) malnutrition in context of chronic illness  INTERVENTION:  - continue Ensure Enlive BID, each supplement provides 350 kcal and 20 grams of protein. - will order 30 mL Prostat BID, each supplement provides 100 kcal and 15 grams of protein. - continue to encourage PO intakes.    NUTRITION DIAGNOSIS:   Moderate Malnutrition related to chronic illness, cancer and cancer related treatments as evidenced by mild fat depletion, mild muscle depletion, percent weight loss.  GOAL:   Patient will meet greater than or equal to 90% of their needs  MONITOR:   PO intake, Supplement acceptance, Labs, Weight trends  REASON FOR ASSESSMENT:   Malnutrition Screening Tool  ASSESSMENT:   64 year-old male with a history of Hodgkin's lymphoma (stage 3, diagnosed 02/2019) on chemotherapy. He follows with Dr. Maylon [name] in Oncology. His most recent treatment was on 3/8. He presented to the ED with worsening respiratory status and inability to tolerate PO intake recently. He had his chemo delayed this week. He has a history of neutropenic fever in the past. He had a good response to chemo on his first follow up PET scan. His appetite has been poor and he has been receiving IV fluids.  Patient consumed 40% of breakfast today. He states that he likely could have eaten more, but several staff were in and out of his room and once food was cold he no longer wanted to eat. He states that after a few bites of of any foods they begin to have a metallic taste, especially meats. He has been using plastic utensils at home.   He wears dentures and they are loose but this is resolved by using fixadent. Once secured, he is able to eat anything without issue but often prefers softer, easier to chew items.  Breakfast is usually a biscuit breakfast sandwich, he sometimes eats lunch and sometimes does not, and dinner is variable: meatloaf, spaghetti, etc. He  sometimes snacks on items such as chicken noodle soup or chocolate in the evening.   He reports being dehydrated recently. He mainly drinks water (6-8 bottles/day) and sometimes drinks gatorade and will drink 1 bottle of Ensure (chocolate) a few times/week. We discussed drinking at least 1-2 bottles of Ensure/day, especially on days when he does not eat lunch.  Patient reports he has recently lost weight from 171 lb to 164 lb. This is reflected in the chart. Weight on 2/22 was 181 lb, weight on 3/8 was 177 lb, and weight yesterday and today is 164 lb. This indicates 13 lb weight loss (7% body weight) in the past 3 weeks; significant for time frame. Also reflects 17 lb weight loss (9.4% body weight) in the past 5 weeks; significant for time frame.    Labs reviewed; CBG: 142 mg/dl, Ca: 8 mg/dl, Phos: 5 mg/dl. Medications reviewed; 325 mg ferrous sultate/day, 40 mg IV lasix x1 dose 3/30, sliding scale novolog, 2 g IV Mg sulfate x1 run 3/30, multivitamin with minerals BID, 40 mEq Klor-Con x2 doses 3/30.    NUTRITION - FOCUSED PHYSICAL EXAM:    Most Recent Value  Orbital Region  No depletion  Upper Arm Region  Mild depletion  Thoracic and Lumbar Region  Mild depletion  Buccal Region  Mild depletion  Temple Region  Mild depletion  Clavicle Bone Region  No depletion  Clavicle and Acromion Bone Region  Mild depletion  Scapular Bone Region  Unable to assess  Dorsal Hand  No depletion  Patellar Region  Unable to assess  Anterior Thigh Region  Unable to assess  Posterior Calf Region  Unable to assess  Edema (RD Assessment)  None  Hair  Reviewed  Eyes  Reviewed  Mouth  Reviewed  Skin  Reviewed  Nails  Reviewed       Diet Order:   Diet Order            Diet regular Room service appropriate? Yes; Fluid consistency: Thin  Diet effective now              EDUCATION NEEDS:   No education needs have been identified at this time  Skin:  Skin Assessment: Reviewed RN Assessment  Last  BM:  3/27  Height:   Ht Readings from Last 1 Encounters:  07/08/19 5\' 6"  (1.676 m)    Weight:   Wt Readings from Last 1 Encounters:  07/09/19 74.4 kg    Ideal Body Weight:  64.5 kg  BMI:  Body mass index is 26.47 kg/m.  Estimated Nutritional Needs:   Kcal:  0802-2336 kcal  Protein:  110-120 grams  Fluid:  >/= 2.2 L/day     Steve Matin, MS, RD, LDN, CNSC Inpatient Clinical Dietitian RD pager # available in AMION  After hours/weekend pager # available in [facility]

## 2019-07-09 NOTE — Progress Notes (Signed)
EKG completed and paper copy placed on patient chart.

## 2019-07-09 NOTE — Progress Notes (Signed)
NAME:  Steve [name], MRN:  [id number], DOB:  10/11/1955, LOS: 1 ADMISSION DATE:  07/08/2019, CONSULTATION DATE:  3/29 REFERRING MD:  [name], CHIEF COMPLAINT:  hypoxia   Brief History   Hodgkin's lymphoma Covid in Feb 2021, chronic hypoxic respiratory failure on 2L HOT since Acute worsening of respiratory failure  History of present illness   [name] is a 64 y/o gentleman with a history of Hodgkin's lymphoma (stage III+, diagnosed 02/2019) on chemotherapy with AVD + Adcetris (brentuximab vedotin, doxorubicin, vinblastine, dacarbazine). He follows with Dr. Maylon Peppers in Oncology. His most recent treatment was about 3 weeks ago (06/17/19). Due to worsening respiratory status and inability to tolerate PO intake recently, he had his chemo delayed this week.  He has a history of neutropenic fever in the past. He receives G-CSF post-chemo. He had a good response to chemo on his first follow up PET scan.   His appetite has been poor and he has been receiving IV fluids. For the past few weeks his dyspnea has been worsening, and today he had saturations in the 50s at home on his supplemental oxygen, which he has been increasing recently on his home concentrator. He endorses sore throat and having white membrane in his throat. No cough, sputum production, edema, CP, abdominal distention, n/v/d. No change over port site or tract. No family history of lung disease.  Past Medical History  Hodgkin's lymphoma Staph septic arthritis of hip arthroplasty bilaterally Covid 19 viral pneumonia DM OSA HTN Pancreatitis GERD  Significant Hospital Events     Consults:    Procedures:    Significant Diagnostic Tests:  CTA chest 3/29: pending  Micro Data:  3/29 blood cultures   Antimicrobials:  Cefepime 3/29>> vanc 3/29>> Azithromycin 3/29>> Fluconazole 3/29>>  Interim history/subjective:  Sitting up in bed with continued dyspnea, reports little improvement overnight.  Objective   Blood  pressure 116/70, pulse 87, temperature 98.1 F (36.7 C), temperature source Axillary, resp. rate (!) 39, height 5\' 6"  (1.676 m), weight 74.4 kg, SpO2 (!) 89 %.    FiO2 (%):  [100 %] 100 %   Intake/Output Summary (Last 24 hours) at 07/09/2019 0949 Last data filed at 07/09/2019 0263 Gross per 24 hour  Intake 2660 ml  Output 750 ml  Net 1910 ml   Filed Weights   07/08/19 1359 07/08/19 2207 07/09/19 0500  Weight: 74.4 kg 74.4 kg 74.4 kg    Examination: General: Chronically ill appearing elderly frail appearing male, in NAD HEENT: [place]/AT, MM pink/moist, PERRL,  Neuro: Alert and oriented x3, non-focal  CV: s1s2 regular rate and rhythm, no murmur, rubs, or gallops, left chest port  PULM:  No increased work of breathing, rales bilaterally, currently requiring !5 L NRB and 15 L HFNC  GI: soft, bowel sounds active in all 4 quadrants, non-tender, non-distended Extremities: warm/dry, no edema  Skin: no rashes or lesions  Resolved Hospital Problem list     Assessment & Plan:  Acute on chronic hypoxic respiratory failure Concern for CAP  -Chest x-ray with diffuse bilateral infiltrates appearing as a progression since his most recent chest x-ray with Covid.  Multiple possibilities for acute worsening exist-concern for progression to post Covid fibrosis, post Covid organizing pneumonia/ BOOP, post-viral bacterial or fungal pneumonia, PE, acute pulmonary edema due to heart failure. -With oral thrush and history of neutropenia, increased index of suspicion for fungal infections.  -Doubt toxicity from chemotherapy as his current chemo regimen a/w <1% incidence of pulmonary toxicity (https://www.nejm.org/doi/full/10.1056/nejmoa1708984) Plan: Continue  empiric antibiotics and antifungal  Procalitonin elevated at 3.26 Follow urine legionella, urine strep, and cultures  Follow histoplasma, cryptococcus, Aspergillus antigens and Fungitell ECHO pending  BNP elevated at 407 CTA chest negative for PE   Continue supplemental oxygen to maintain SPO2 goal greater than 88% If intubated patient would benefit from bronchoscope   DM2 -Goal BG 140-180 while admitted to the ICU Plan: SSI CBG checks ACHS  Chronic anemia-stable Plan: Transfuse for hemoglobin less than 7 Continue to monitor  Continue enteral iron supplementation   Oral thrush Plan: Fluconazole  Magic mouthwash   History of Hodgkin's lymphoma Plan: Continue to hold chemotherapy  DVT pompholyxis with LMWH   HLD Plan: Continue home statin   Best practice:  Diet: regular Pain/Anxiety/Delirium protocol (if indicated): n/a VAP protocol (if indicated): n/a DVT prophylaxis: lovenox GI prophylaxis: protonix Glucose control: SSI Mobility: up with assist Code Status: full Family Communication: wife- surrogate decision maker Disposition: ICU  Labs   CBC: Recent Labs  Lab 07/08/19 1244 07/08/19 1331 07/08/19 2020 07/09/19 0510  WBC 37.6*  --  33.8* 35.1*  NEUTROABS 33.4*  --   --   --   HGB 8.9* 9.5* 8.2* 7.7*  HCT 28.6* 28.0* 26.2* 24.6*  MCV 91.4  --  92.3 93.2  PLT 283  --  242 696    Basic Metabolic Panel: Recent Labs  Lab 07/08/19 1244 07/08/19 1331 07/08/19 2020 07/09/19 0510  NA 143 140  --  141  K 3.9 3.7  --  3.7  CL 104 102  --  107  CO2 27  --   --  25  GLUCOSE 185* 177*  --  167*  BUN 22 19  --  23  CREATININE 0.92 0.80 0.82 0.82  CALCIUM 8.4*  --   --  8.0*  MG  --   --   --  1.7  PHOS  --   --   --  5.0*   GFR: Estimated Creatinine Clearance: 83.2 mL/min (by C-G formula based on SCr of 0.82 mg/dL). Recent Labs  Lab 07/08/19 1244 07/08/19 1645 07/08/19 2020 07/09/19 0510  PROCALCITON  --   --  3.26 3.09  WBC 37.6*  --  33.8* 35.1*  LATICACIDVEN 1.6 1.4  --   --     Liver Function Tests: Recent Labs  Lab 07/08/19 1244 07/09/19 0510  AST 25 23  ALT 12 13  ALKPHOS 85 77  BILITOT 1.1 1.0  PROT 7.3 6.3*  ALBUMIN 2.4* 2.0*   No results for input(s): LIPASE, AMYLASE  in the last 168 hours. No results for input(s): AMMONIA in the last 168 hours.  ABG    Component Value Date/Time   PHART 7.473 (H) 07/08/2019 1245   PCO2ART 37.1 07/08/2019 1245   PO2ART 64.5 (L) 07/08/2019 1245   HCO3 26.8 07/08/2019 1245   TCO2 28 07/08/2019 1331   O2SAT 91.2 07/08/2019 1245     Coagulation Profile: No results for input(s): INR, PROTIME in the last 168 hours.  Cardiac Enzymes: No results for input(s): CKTOTAL, CKMB, CKMBINDEX, TROPONINI in the last 168 hours.  HbA1C: Hgb A1c MFr Bld  Date/Time Value Ref Range Status  07/09/2019 05:10 AM 7.3 (H) 4.8 - 5.6 % Final    Comment:    (NOTE) Pre diabetes:          5.7%-6.4% Diabetes:              >6.4% Glycemic control for   <7.0% adults with diabetes  05/19/2019 03:56 AM 8.3 (H) 4.8 - 5.6 % Final    Comment:    (NOTE) Pre diabetes:          5.7%-6.4% Diabetes:              >6.4% Glycemic control for   <7.0% adults with diabetes     CBG: Recent Labs  Lab 07/08/19 2139 07/09/19 0814  GLUCAP 181* 142*    Critical care time:    Performed by: Johnsie Cancel  Total critical care time: 35 minutes  Critical care time was exclusive of separately billable procedures and treating other patients.  Critical care was necessary to treat or prevent imminent or life-threatening deterioration.  Critical care was time spent personally by me on the following activities: development of treatment plan with patient and/or surrogate as well as nursing, discussions with consultants, evaluation of patient's response to treatment, examination of patient, obtaining history from patient or surrogate, ordering and performing treatments and interventions, ordering and review of laboratory studies, ordering and review of radiographic studies, pulse oximetry and re-evaluation of patient's condition.   Johnsie Cancel, NP-C [facility] Contact / Pager information can be found on Amion  07/09/2019,  10:10 AM

## 2019-07-10 ENCOUNTER — Encounter (HOSPITAL_COMMUNITY): Payer: Self-pay | Admitting: Critical Care Medicine

## 2019-07-10 DIAGNOSIS — J189 Pneumonia, unspecified organism: Secondary | ICD-10-CM | POA: Diagnosis present

## 2019-07-10 DIAGNOSIS — J9620 Acute and chronic respiratory failure, unspecified whether with hypoxia or hypercapnia: Secondary | ICD-10-CM

## 2019-07-10 DIAGNOSIS — C8118 Nodular sclerosis classical Hodgkin lymphoma, lymph nodes of multiple sites: Secondary | ICD-10-CM | POA: Diagnosis not present

## 2019-07-10 DIAGNOSIS — B379 Candidiasis, unspecified: Secondary | ICD-10-CM

## 2019-07-10 DIAGNOSIS — R0902 Hypoxemia: Secondary | ICD-10-CM | POA: Diagnosis present

## 2019-07-10 DIAGNOSIS — E1165 Type 2 diabetes mellitus with hyperglycemia: Secondary | ICD-10-CM | POA: Diagnosis not present

## 2019-07-10 DIAGNOSIS — J9601 Acute respiratory failure with hypoxia: Secondary | ICD-10-CM

## 2019-07-10 DIAGNOSIS — C8590 Non-Hodgkin lymphoma, unspecified, unspecified site: Secondary | ICD-10-CM

## 2019-07-10 DIAGNOSIS — C8190 Hodgkin lymphoma, unspecified, unspecified site: Secondary | ICD-10-CM | POA: Diagnosis not present

## 2019-07-10 DIAGNOSIS — D649 Anemia, unspecified: Secondary | ICD-10-CM | POA: Diagnosis not present

## 2019-07-10 DIAGNOSIS — R918 Other nonspecific abnormal finding of lung field: Secondary | ICD-10-CM | POA: Diagnosis not present

## 2019-07-10 DIAGNOSIS — E119 Type 2 diabetes mellitus without complications: Secondary | ICD-10-CM

## 2019-07-10 DIAGNOSIS — J9621 Acute and chronic respiratory failure with hypoxia: Secondary | ICD-10-CM | POA: Diagnosis not present

## 2019-07-10 DIAGNOSIS — E440 Moderate protein-calorie malnutrition: Secondary | ICD-10-CM | POA: Diagnosis present

## 2019-07-10 LAB — GLUCOSE, CAPILLARY
Glucose-Capillary: 124 mg/dL — ABNORMAL HIGH (ref 70–99)
Glucose-Capillary: 252 mg/dL — ABNORMAL HIGH (ref 70–99)
Glucose-Capillary: 163 mg/dL — ABNORMAL HIGH (ref 70–99)
Glucose-Capillary: 203 mg/dL — ABNORMAL HIGH (ref 70–99)

## 2019-07-10 LAB — LACTATE DEHYDROGENASE: LDH: 537 U/L — ABNORMAL HIGH (ref 98–192)

## 2019-07-10 LAB — BASIC METABOLIC PANEL
Anion gap: 8 (ref 5–15)
BUN: 27 mg/dL — ABNORMAL HIGH (ref 8–23)
CO2: 27 mmol/L (ref 22–32)
Calcium: 7.9 mg/dL — ABNORMAL LOW (ref 8.9–10.3)
Chloride: 108 mmol/L (ref 98–111)
Creatinine, Ser: 0.81 mg/dL (ref 0.61–1.24)
GFR calc Af Amer: >60 mL/min (ref >60)
GFR calc non Af Amer: >60 mL/min (ref >60)
Glucose, Bld: 146 mg/dL — ABNORMAL HIGH (ref 70–99)
Potassium: 3.5 mmol/L (ref 3.5–5.1)
Sodium: 143 mmol/L (ref 135–145)

## 2019-07-10 LAB — CBC
HCT: 23.4 % — ABNORMAL LOW (ref 39.0–52.0)
Hemoglobin: 7.3 g/dL — ABNORMAL LOW (ref 13.0–17.0)
MCH: 29.3 pg (ref 26.0–34.0)
MCHC: 31.2 g/dL (ref 30.0–36.0)
MCV: 94 fL (ref 80.0–100.0)
Platelets: 215 10*3/uL (ref 150–400)
RBC: 2.49 MIL/uL — ABNORMAL LOW (ref 4.22–5.81)
RDW: 20.8 % — ABNORMAL HIGH (ref 11.5–15.5)
WBC: 31.6 10*3/uL — ABNORMAL HIGH (ref 4.0–10.5)
nRBC: 0.4 % — ABNORMAL HIGH (ref 0.0–0.2)

## 2019-07-10 LAB — HISTOPLASMA ANTIGEN, URINE: Histoplasma Antigen, urine: <0.5 (ref <0.5)

## 2019-07-10 LAB — PROCALCITONIN: Procalcitonin: 1.84 ng/mL

## 2019-07-10 MED ORDER — ACETAMINOPHEN 650 MG RE SUPP: 650.0000 mg | Freq: Four times a day (QID) | RECTAL | Status: DC | PRN

## 2019-07-10 MED ORDER — LEVALBUTEROL HCL 1.25 MG/0.5ML IN NEBU
INHALATION_SOLUTION | RESPIRATORY_TRACT | Status: AC
  Filled 2019-07-10: qty 0.5

## 2019-07-10 MED ORDER — LEVALBUTEROL HCL 1.25 MG/0.5ML IN NEBU
1.2500 mg | INHALATION_SOLUTION | Freq: Four times a day (QID) | RESPIRATORY_TRACT | Status: DC | PRN
  Administered 2019-07-10: 1.25 mg via RESPIRATORY_TRACT

## 2019-07-10 MED ORDER — SODIUM CHLORIDE 0.9 % IV SOLN
1.0000 g | Freq: Three times a day (TID) | INTRAVENOUS | Status: AC
  Administered 2019-07-10 – 2019-07-17 (×20): 1 g via INTRAVENOUS
  Filled 2019-07-10 (×20): qty 1

## 2019-07-10 MED ORDER — PREDNISONE 20 MG PO TABS
40.0000 mg | ORAL_TABLET | Freq: Two times a day (BID) | ORAL | Status: DC
  Administered 2019-07-10 – 2019-07-11 (×3): 40 mg via ORAL
  Filled 2019-07-10 (×3): qty 2

## 2019-07-10 MED ORDER — PREDNISONE 20 MG PO TABS: 40.0000 mg | ORAL_TABLET | Freq: Every day | ORAL | Status: DC

## 2019-07-10 MED ORDER — MELATONIN 3 MG PO TABS
3.0000 mg | ORAL_TABLET | Freq: Every evening | ORAL | Status: DC | PRN
  Administered 2019-07-10: 3 mg via ORAL
  Filled 2019-07-10: qty 1

## 2019-07-10 MED ORDER — INSULIN ASPART 100 UNIT/ML ~~LOC~~ SOLN
5.0000 [IU] | Freq: Three times a day (TID) | SUBCUTANEOUS | Status: DC
  Administered 2019-07-10 – 2019-07-18 (×7): 5 [IU] via SUBCUTANEOUS

## 2019-07-10 MED ORDER — PREDNISONE 20 MG PO TABS: 20.0000 mg | ORAL_TABLET | Freq: Every day | ORAL | Status: DC

## 2019-07-10 MED ORDER — SULFAMETHOXAZOLE-TRIMETHOPRIM 400-80 MG/5ML IV SOLN
450.0000 mg | Freq: Three times a day (TID) | INTRAVENOUS | Status: DC
  Administered 2019-07-10 – 2019-07-15 (×15): 450 mg via INTRAVENOUS
  Filled 2019-07-10 (×14): qty 28.13
  Filled 2019-07-10: qty 10
  Filled 2019-07-10 (×2): qty 28.13

## 2019-07-10 MED ORDER — LEVALBUTEROL HCL 1.25 MG/0.5ML IN NEBU
1.2500 mg | INHALATION_SOLUTION | Freq: Four times a day (QID) | RESPIRATORY_TRACT | Status: DC
  Administered 2019-07-10 – 2019-07-11 (×3): 1.25 mg via RESPIRATORY_TRACT
  Filled 2019-07-10 (×5): qty 0.5

## 2019-07-10 NOTE — Progress Notes (Addendum)
Pharmacy Antibiotic Note  Steve [name] is a 64 y.o. male admitted on 07/08/2019 with pneumonia.  Pharmacy has been consulted to add sulfamethoxazole-trimethoprim dosing due to concern for pneumocystis pneumonia (d/c vancomycin) and change from Cefepime to Meropenem.  Currently on day # 3 Vanc/Cefepime/Azith/Fluconazole LDH elevated at 537 SCr 0.81 with K 3.5 WBC elevated, 31.6 Remains febrile, Tm 102.6 PCT improving: 3.26 > 3.09 > 1.84  Plan: Sulfamethoxazole-trimethoprim 450 mg IV q8h (~ 18 mg/kg/day) Meropenem 1g IV q8h D/C Vancomycin D/C cefepime Continue azithromycin 500mg  IV q24h Continue fluconazole 100mg  IV q24h. Follow up renal function, culture results, and clinical course.    Height: 5\' 6"  (167.6 cm) Weight: 164 lb 0.4 oz (74.4 kg) IBW/kg (Calculated) : 63.8  Temp (24hrs), Avg:99 F (37.2 C), Min:96.6 F (35.9 C), Max:102.6 F (39.2 C)  Recent Labs  Lab 07/08/19 1244 07/08/19 1331 07/08/19 1645 07/08/19 2020 07/09/19 0510 07/10/19 0605  WBC 37.6*  --   --  33.8* 35.1* 31.6*  CREATININE 0.92 0.80  --  0.82 0.82 0.81  LATICACIDVEN 1.6  --  1.4  --   --   --     Estimated Creatinine Clearance: 84.2 mL/min (by C-G formula based on SCr of 0.81 mg/dL).    No Known Allergies  Antimicrobials this admission: 3/29 > cefepime >> 3/31 3/29 > vancomycin >>3/31 3/29 > Azithromycin >> 3/29 > Fluconazole >> 3/31 > Septra  >>  3/31 > Meropenem >>   Dose adjustments this admission:  Microbiology results: 3/29BCx: ngtd 3/29UCx: NGF  3/29Sputum: Sent 3/29 MRSA PCR: neg 3/30 Blastomyces Ag:   Thank you for allowing pharmacy to be a part of this patient's care.  Gretta Arab PharmD, BCPS Pager: [phone] 07/10/2019 7:48 AM

## 2019-07-10 NOTE — Progress Notes (Signed)
Steve [name]   DOB:[birth year]   GQ#:[id number]    Assessment & Plan:   Acute on chronic hypoxic respiratory failure -Etiology unclear; ddx includes pulmonary edema vs superimposed infection, including opportunistic infection -I reviewed the literature on the chemotherapy the patient has been receiving for Hodgkin lymphoma, and there is no significant known pulmonary toxicity associated with the chemotherapy.  Notably, he has NOT received bleomycin, which is associated pulmonary fibrosis. -Furthermore, his recent PET showed excellent response, so the likelihood of lymphomatous involvement of the lung is very unlikely  -Until the cause of the respiratory failure is determined, his chemotherapy will be on hold  -Continue the management per the ICU and ID teams; appreciate their assistance in caring for the mutual patient  Hodgkin lymphoma -Treatment on hold until the resolution/improvement in the respiratory failure  Normocytic anemia -Likely multifactorial, including recent chemotherapy, anemia of chronic disease and iatrogenic blood loss from phlebotomy -PRN transfusion to keep Hgb > 7; consider Hgb threshold of 8 if hypoxia doesn't improve    Thank you for caring for our mutual patient. Please do not hesitate to contact us if there are any questions.   Tish Men, MD   Subjective:  Steve [name] reports that his shortness of breath is about the same, and it's somewhat uncomfortable to be on high flow oxygen. He still has intermittent cough.  He denies any other complaint.   ROS: Constitutional: ( - ) fevers, ( - )  chills , ( - ) night sweats Ears, nose, mouth, throat, and face: ( - ) mucositis, ( - ) sore throat Respiratory: ( + ) cough, ( + ) dyspnea, ( - ) wheezes Cardiovascular: ( - ) palpitation, ( - ) chest discomfort, ( - ) lower extremity swelling Gastrointestinal:  ( - ) nausea, ( - ) heartburn, ( - ) change in bowel habits Skin: ( - ) abnormal skin rashes Behavioral/Psych:  ( - ) mood change, ( - ) new changes  All other systems were reviewed with the patient and are negative.  Objective:  Vitals:   07/10/19 0700 07/10/19 0800  BP: 111/70 102/73  Pulse: 78 80  Resp: (!) 36 20  Temp:    SpO2: 97% 100%     Intake/Output Summary (Last 24 hours) at 07/10/2019 2671 Last data filed at 07/10/2019 0700 Gross per 24 hour  Intake 2602.36 ml  Output 2475 ml  Net 127.36 ml    GENERAL: alert, no distress on HFNC  SKIN: skin color, texture, turgor are normal, no rashes or significant lesions EYES: conjunctiva are pink and non-injected, sclera clear OROPHARYNX: no exudate, no erythema; lips, buccal mucosa, and tongue normal  NECK: supple, non-tender LUNGS: limited respiratory exam due to HFNC  HEART: regular rate & rhythm and no murmurs and no lower extremity edema ABDOMEN: soft, non-tender, non-distended, normal bowel sounds PSYCH: alert & oriented x 3, fluent speech   Labs:  Lab Results  Component Value Date   WBC 31.6 (H) 07/10/2019   HGB 7.3 (L) 07/10/2019   HCT 23.4 (L) 07/10/2019   MCV 94.0 07/10/2019   PLT 215 07/10/2019   NEUTROABS 33.4 (H) 07/08/2019    Lab Results  Component Value Date   NA 143 07/10/2019   K 3.5 07/10/2019   CL 108 07/10/2019   CO2 27 07/10/2019    Studies:  CT ANGIO CHEST PE W OR WO CONTRAST  Result Date: 07/08/2019 CLINICAL DATA:  Hypoxemia, history of COVID-19 positivity 2 months ago EXAM: CT  ANGIOGRAPHY CHEST WITH CONTRAST TECHNIQUE: Multidetector CT imaging of the chest was performed using the standard protocol during bolus administration of intravenous contrast. Multiplanar CT image reconstructions and MIPs were obtained to evaluate the vascular anatomy. CONTRAST:  3mL OMNIPAQUE IOHEXOL 350 MG/ML SOLN COMPARISON:  Chest x-ray from earlier in the same day and prior PET-CT from 06/14/2019 FINDINGS: Cardiovascular: Thoracic aorta demonstrates a normal enhancement pattern. No aneurysmal dilatation or dissection is  noted. No cardiac enlargement is seen. Pulmonary artery shows a normal branching pattern. No definitive intraluminal filling defect is identified to suggest pulmonary embolism. The peripheral branches are somewhat limited due to the significant overlying parenchymal abnormality. Mediastinum/Nodes: Thoracic inlet again shows a stable right supraclavicular lymph node measuring approximately 12 mm in short axis. Right pretracheal/paratracheal lymph node is again noted and relatively stable from the prior exam consistent with the patient's given clinical history of Hodgkin's lymphoma. Scattered smaller mediastinal nodes are seen. Scattered hilar nodes are noted but smaller in size. Lungs/Pleura: Lungs again demonstrates some peripheral subpleural fibrotic changes consistent with the known history of prior COVID-19 positivity. These have been stable for several exams. There is however new superimposed ground-glass opacity throughout both lungs most consistent with pulmonary edema given its rapid onset. No sizable effusion is seen. No definitive parenchymal nodules are noted at this time although evaluation is somewhat limited. Upper Abdomen: Visualized upper abdomen appears within normal limits. Musculoskeletal: Degenerative changes of the thoracic spine are noted. No acute rib abnormality is seen. Review of the MIP images confirms the above findings. IMPRESSION: Diffuse widespread pulmonary opacity which is acute in nature superimposed over more chronic subpleural fibrotic changes that were noted on prior CT. These changes most likely represent acute pulmonary edema given the rapid onset. Stable right supraclavicular and mediastinal lymph nodes similar to that seen on prior CT examination. No other vascular abnormality is noted. Electronically Signed   By: Inez Catalina M.D.   On: 07/08/2019 19:53   DG Chest Port 1 View  Result Date: 07/08/2019 CLINICAL DATA:  Shortness of breath. COVID-19 positive. Hodgkin's  lymphoma. EXAM: PORTABLE CHEST 1 VIEW COMPARISON:  July 08, 2019 chest radiograph and PET-CT June 14, 2019 FINDINGS: Port-A-Cath tip is in the superior vena cava near the cavoatrial junction level. No pneumothorax. There is patchy airspace opacity throughout the lungs bilaterally with small pleural effusions bilaterally. Heart size and pulmonary vascularity are normal. No adenopathy appreciable by radiography. There are surgical clips overlying the right supraclavicular region. No bone lesions evident. IMPRESSION: Widespread patchy airspace opacity noted bilaterally. Question multifocal pneumonia. A degree of underlying fibrosis is apparent on PET study. Suspect superimposed multifocal pneumonia. Small pleural effusions bilaterally. Stable cardiac silhouette. Electronically Signed   By: Lowella Grip III M.D.   On: 07/08/2019 12:59   ECHOCARDIOGRAM COMPLETE  Result Date: 07/09/2019    ECHOCARDIOGRAM REPORT   Patient Name:   Steve [name] [facility] Date of Exam: 07/09/2019 Medical Rec #:  [id number]         Height:       66.0 in Accession #:    [id number]        Weight:       164.0 lb Date of Birth:  [birth year]          BSA:          1.838 m Patient Age:    40 years          BP:           113/73 mmHg  Patient Gender: M                 HR:           98 bpm. Exam Location:  Inpatient Procedure: 2D Echo, Cardiac Doppler and Color Doppler Indications:    Dyspnea [phone] / R06.00  History:        Patient has prior history of Echocardiogram examinations, most                 recent 04/01/2019. Risk Factors:Hypertension, Diabetes, Sleep                 Apnea and GERD. COVID19. Lymphoma.  Sonographer:    Tiffany Dance Referring Phys: [place]  1. Left ventricular ejection fraction, by estimation, is 60 to 65%. The left ventricle has normal function. The left ventricle has no regional wall motion abnormalities. Left ventricular diastolic parameters are consistent with Grade I diastolic dysfunction  (impaired relaxation).  2. Right ventricular systolic function is normal. The right ventricular size is normal.  3. Left atrial size was mildly dilated.  4. The mitral valve is grossly normal. Trivial mitral valve regurgitation.  5. The aortic valve is tricuspid. Aortic valve regurgitation is not visualized.  6. The inferior vena cava is normal in size with greater than 50% respiratory variability, suggesting right atrial pressure of 3 mmHg.  7. Cannot exclude small PFO. Comparison(s): Changes from prior study are noted. 04/01/2019: LVEF 55-60%. FINDINGS  Left Ventricle: Left ventricular ejection fraction, by estimation, is 60 to 65%. The left ventricle has normal function. The left ventricle has no regional wall motion abnormalities. The left ventricular internal cavity size was normal in size. There is  no left ventricular hypertrophy. Left ventricular diastolic parameters are consistent with Grade I diastolic dysfunction (impaired relaxation). Indeterminate filling pressures. Right Ventricle: The right ventricular size is normal. No increase in right ventricular wall thickness. Right ventricular systolic function is normal. Left Atrium: Left atrial size was mildly dilated. Right Atrium: Right atrial size was normal in size. Pericardium: There is no evidence of pericardial effusion. Mitral Valve: The mitral valve is grossly normal. Trivial mitral valve regurgitation. Tricuspid Valve: The tricuspid valve is grossly normal. Tricuspid valve regurgitation is trivial. Aortic Valve: The aortic valve is tricuspid. Aortic valve regurgitation is not visualized. Pulmonic Valve: The pulmonic valve was normal in structure. Pulmonic valve regurgitation is not visualized. Aorta: The aortic root and ascending aorta are structurally normal, with no evidence of dilitation. Venous: The inferior vena cava is normal in size with greater than 50% respiratory variability, suggesting right atrial pressure of 3 mmHg. IAS/Shunts: The  interatrial septum is aneurysmal. Cannot exclude small PFO.  LEFT VENTRICLE PLAX 2D LVIDd:         4.70 cm  Diastology LVIDs:         2.90 cm  LV e' lateral:   12.30 cm/s LV PW:         0.90 cm  LV E/e' lateral: 7.1 LV IVS:        0.70 cm  LV e' medial:    7.40 cm/s LVOT diam:     1.90 cm  LV E/e' medial:  11.8 LV SV:         65 LV SV Index:   35 LVOT Area:     2.84 cm  RIGHT VENTRICLE             IVC RV Basal diam:  2.60 cm  IVC diam: 1.70 cm RV S prime:     20.00 cm/s TAPSE (M-mode): 2.7 cm LEFT ATRIUM             Index       RIGHT ATRIUM           Index LA diam:        4.10 cm 2.23 cm/m  RA Area:     15.50 cm LA Vol (A2C):   63.0 ml 34.27 ml/m RA Volume:   38.60 ml  21.00 ml/m LA Vol (A4C):   64.5 ml 35.09 ml/m LA Biplane Vol: 63.8 ml 34.71 ml/m  AORTIC VALVE LVOT Vmax:   126.00 cm/s LVOT Vmean:  75.400 cm/s LVOT VTI:    0.228 m  AORTA Ao Root diam: 3.80 cm Ao Asc diam:  3.40 cm MITRAL VALVE MV Area (PHT): 3.42 cm     SHUNTS MV Decel Time: 222 msec     Systemic VTI:  0.23 m MV E velocity: 87.30 cm/s   Systemic Diam: 1.90 cm MV A velocity: 118.00 cm/s MV E/A ratio:  0.74 Lyman Bishop MD Electronically signed by Lyman Bishop MD Signature Date/Time: 07/09/2019/11:52:36 AM    Final    VAS [place] LOWER EXTREMITY VENOUS (DVT)  Result Date: 07/09/2019  Lower Venous DVTStudy Indications: Dyspnea.  Risk Factors: Cancer. Comparison Study: No prior studies. Performing Technologist: Oliver Hum RVT  Examination Guidelines: A complete evaluation includes B-mode imaging, spectral Doppler, color Doppler, and power Doppler as needed of all accessible portions of each vessel. Bilateral testing is considered an integral part of a complete examination. Limited examinations for reoccurring indications may be performed as noted. The reflux portion of the exam is performed with the patient in reverse Trendelenburg.  +---------+---------------+---------+-----------+----------+--------------+ RIGHT     CompressibilityPhasicitySpontaneityPropertiesThrombus Aging +---------+---------------+---------+-----------+----------+--------------+ CFV      Full           Yes      Yes                                 +---------+---------------+---------+-----------+----------+--------------+ SFJ      Full                                                        +---------+---------------+---------+-----------+----------+--------------+ FV Prox  Full                                                        +---------+---------------+---------+-----------+----------+--------------+ FV Mid   Full                                                        +---------+---------------+---------+-----------+----------+--------------+ FV DistalFull                                                        +---------+---------------+---------+-----------+----------+--------------+  PFV      Full                                                        +---------+---------------+---------+-----------+----------+--------------+ POP      Full           Yes      Yes                                 +---------+---------------+---------+-----------+----------+--------------+ PTV      Full                                                        +---------+---------------+---------+-----------+----------+--------------+ PERO     Full                                                        +---------+---------------+---------+-----------+----------+--------------+   +---------+---------------+---------+-----------+----------+--------------+ LEFT     CompressibilityPhasicitySpontaneityPropertiesThrombus Aging +---------+---------------+---------+-----------+----------+--------------+ CFV      Full           Yes      Yes                                 +---------+---------------+---------+-----------+----------+--------------+ SFJ      Full                                                         +---------+---------------+---------+-----------+----------+--------------+ FV Prox  Full                                                        +---------+---------------+---------+-----------+----------+--------------+ FV Mid   Full                                                        +---------+---------------+---------+-----------+----------+--------------+ FV DistalFull                                                        +---------+---------------+---------+-----------+----------+--------------+ PFV      Full                                                        +---------+---------------+---------+-----------+----------+--------------+  POP      Full           Yes      Yes                                 +---------+---------------+---------+-----------+----------+--------------+ PTV      Full                                                        +---------+---------------+---------+-----------+----------+--------------+ PERO     Full                                                        +---------+---------------+---------+-----------+----------+--------------+     Summary: RIGHT: - There is no evidence of deep vein thrombosis in the lower extremity.  - No cystic structure found in the popliteal fossa.  LEFT: - There is no evidence of deep vein thrombosis in the lower extremity.  - No cystic structure found in the popliteal fossa.  *See table(s) above for measurements and observations. Electronically signed by Deitra Mayo MD on 07/09/2019 at 5:04:18 PM.    Final

## 2019-07-10 NOTE — Progress Notes (Signed)
NAME:  Steve [name], MRN:  [id number], DOB:  Feb 03, 1956, LOS: 2 ADMISSION DATE:  07/08/2019, CONSULTATION DATE:  3/29 REFERRING MD:  Steve [name], CHIEF COMPLAINT:  hypoxia   Brief History   Hodgkin's lymphoma Covid in Feb 2021, chronic hypoxic respiratory failure on 2L HOT since Acute worsening of respiratory failure  History of present illness   Steve [name] is a 64 y/o gentleman with a history of Hodgkin's lymphoma (stage III+, diagnosed 02/2019) on chemotherapy with AVD + Adcetris (brentuximab vedotin, doxorubicin, vinblastine, dacarbazine). He follows with Steve [name] in Oncology. His most recent treatment was about 3 weeks ago (06/17/19). Due to worsening respiratory status and inability to tolerate PO intake recently, he had his chemo delayed this week.  He has a history of neutropenic fever in the past. He receives G-CSF post-chemo. He had a good response to chemo on his first follow up PET scan.   His appetite has been poor and he has been receiving IV fluids. For the past few weeks his dyspnea has been worsening, and today he had saturations in the 50s at home on his supplemental oxygen, which he has been increasing recently on his home concentrator. He endorses sore throat and having white membrane in his throat. No cough, sputum production, edema, CP, abdominal distention, n/v/d. No change over port site or tract. No family history of lung disease.  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 Left ventricular ejection fraction, by estimation, is 60 to 65%. The  left ventricle has normal function. The left ventricle has no regional  wall motion abnormalities. Left ventricular diastolic parameters are  consistent with Grade I diastolic   dysfunction (impaired relaxation).  2. Right ventricular systolic function is normal. The right ventricular  size is normal.  3. Left atrial size was mildly dilated.  4. The mitral valve is grossly normal. Trivial mitral valve  regurgitation.  5. The aortic valve is tricuspid. Aortic valve regurgitation is not  visualized.  6. The inferior vena cava is normal in size with greater than 50%  respiratory variability, suggesting right atrial pressure of 3 mmHg.  7. Cannot exclude small PFO.   Micro Data:  3/29 blood cultures   Antimicrobials:  Cefepime 3/29>> vanc 3/29>> Azithromycin 3/29>> Fluconazole 3/29>>  Interim history/subjective:  Developed fever overnight with Tmax of 102.6, remains on HFNC. Denies any acute complaints today.   Objective   Blood pressure 111/70, pulse 78, temperature (!) 102.6 F (39.2 C), temperature source Axillary, resp. rate (!) 36, height 5\' 6"  (1.676 m), weight 74.4 kg, SpO2 97 %.    FiO2 (%):  [80 %-100 %] 80 %   Intake/Output Summary (Last 24 hours) at 07/10/2019 0800 Last data filed at 07/10/2019 0700 Gross per 24 hour  Intake 2612.36 ml  Output 2775 ml  Net -162.64 ml   Filed Weights   07/08/19 1359 07/08/19 2207 07/09/19 0500  Weight: 74.4 kg 74.4 kg 74.4 kg    Examination: General: Chronically ill appearing elderly male on HFNC, in NAD HEENT: [place]/AT, MM pink/moist, PERRL,  Neuro: Alert and oriented x3, non-focal  CV: s1s2 regular rate and rhythm, no murmur, rubs, or gallops,  PULM:  Mildly tachypneic, no accessory muscle use, no added  breath  sounds  GI: soft, bowel sounds active in all 4 quadrants, non-tender, non-distended Extremities: warm/dry, no edema  Skin: no rashes or lesions  Resolved Hospital Problem list     Assessment & Plan:  Acute on chronic hypoxic respiratory failure Concern for PCP in the setting of chemotherapy with monoclonal antibody   -Chest x-ray with diffuse bilateral infiltrates appearing as a  progression since his most recent chest x-ray with Covid.  Multiple possibilities for acute worsening exist-concern for progression to post Covid fibrosis, post Covid organizing pneumonia/ BOOP, post-viral bacterial or fungal pneumonia -With oral thrush and history of neutropenia, increased index of suspicion for fungal infections.  -Doubt toxicity from chemotherapy as his current chemo regimen a/w <1% incidence of pulmonary toxicity (https://www.nejm.org/doi/full/10.1056/nejmoa1708984) -There is recent data on the association of the development of PCP pneumonia with use of brentuximab as a monoclonal antibody, 0.1-1% risk  Plan: LDH elevated at 537 Start PCP prophylaxis with IV bacterium and steroids  Continue IV Cefepime, Azithromycin, and fluconazole  Strep pneumonia negative, cryptococcus Urine legionella, histoplasma, Aspergillus antigens and Fungitell pending  Procalcitonin downtrending  ECHO as above Continue supplemental oxygen  If intubated will bronch at that time   DM2 -Goal BG 140-180 while admitted to the ICU Plan: SSI CBG checks ACHS  Chronic anemia-stable -hgb slowly drifting down  Plan: Transfuse per protocol  Hemoglobin goal greater than 7 Continue to monitor  Continue enteral iron supplementation   Oral thrush Plan: Fluconazole and magic mouth wash    History of Hodgkin's lymphoma Plan: Hold chemotherapy  DVT prophylaxis with LMWH   HLD Plan: Continue home statin   Best practice:  Diet: regular Pain/Anxiety/Delirium protocol (if indicated): n/a VAP protocol (if indicated): n/a DVT prophylaxis: lovenox GI prophylaxis: protonix Glucose control: SSI Mobility: up with assist Code Status: full Family Communication: wife- surrogate decision maker Disposition: ICU  Labs   CBC: Recent Labs  Lab 07/08/19 1244 07/08/19 1331 07/08/19 2020 07/09/19 0510 07/10/19 0605  WBC 37.6*  --  33.8* 35.1* 31.6*  NEUTROABS 33.4*  --   --   --   --   HGB 8.9*  9.5* 8.2* 7.7* 7.3*  HCT 28.6* 28.0* 26.2* 24.6* 23.4*  MCV 91.4  --  92.3 93.2 94.0  PLT 283  --  242 231 563    Basic Metabolic Panel: Recent Labs  Lab 07/08/19 1244 07/08/19 1331 07/08/19 2020 07/09/19 0510 07/10/19 0605  NA 143 140  --  141 143  K 3.9 3.7  --  3.7 3.5  CL 104 102  --  107 108  CO2 27  --   --  25 27  GLUCOSE 185* 177*  --  167* 146*  BUN 22 19  --  23 27*  CREATININE 0.92 0.80 0.82 0.82 0.81  CALCIUM 8.4*  --   --  8.0* 7.9*  MG  --   --   --  1.7  --   PHOS  --   --   --  5.0*  --    GFR: Estimated Creatinine Clearance: 84.2 mL/min (by C-G formula based on SCr of 0.81 mg/dL). Recent Labs  Lab 07/08/19 1244 07/08/19 1645 07/08/19 2020 07/09/19 0510 07/10/19 0605  PROCALCITON  --   --  3.26 3.09 1.84  WBC 37.6*  --  33.8* 35.1* 31.6*  LATICACIDVEN 1.6 1.4  --   --   --     Liver Function Tests: Recent Labs  Lab 07/08/19 1244 07/09/19 0510  AST 25 23  ALT 12 13  ALKPHOS 85 77  BILITOT 1.1 1.0  PROT 7.3 6.3*  ALBUMIN 2.4* 2.0*   No results for input(s): LIPASE, AMYLASE in the last 168 hours. No results for input(s): AMMONIA in the last 168 hours.  ABG    Component Value Date/Time   PHART 7.473 (H) 07/08/2019 1245   PCO2ART 37.1 07/08/2019 1245   PO2ART 64.5 (L) 07/08/2019 1245   HCO3 26.8 07/08/2019 1245   TCO2 28 07/08/2019 1331   O2SAT 91.2 07/08/2019 1245     Coagulation Profile: No results for input(s): INR, PROTIME in the last 168 hours.  Cardiac Enzymes: No results for input(s): CKTOTAL, CKMB, CKMBINDEX, TROPONINI in the last 168 hours.  HbA1C: Hgb A1c MFr Bld  Date/Time Value Ref Range Status  07/09/2019 05:10 AM 7.3 (H) 4.8 - 5.6 % Final    Comment:    (NOTE) Pre diabetes:          5.7%-6.4% Diabetes:              >6.4% Glycemic control for   <7.0% adults with diabetes   05/19/2019 03:56 AM 8.3 (H) 4.8 - 5.6 % Final    Comment:    (NOTE) Pre diabetes:          5.7%-6.4% Diabetes:               >6.4% Glycemic control for   <7.0% adults with diabetes     CBG: Recent Labs  Lab 07/08/19 2139 07/09/19 0814 07/09/19 1154 07/09/19 1714 07/09/19 2347  GLUCAP 181* 142* 214* 215* 143*    Critical care time:    Performed by: Johnsie Cancel  Total critical care time: 38 minutes  Critical care time was exclusive of separately billable procedures and treating other patients.  Critical care was necessary to treat or prevent imminent or life-threatening deterioration.  Critical care was time spent personally by me on the following activities: development of treatment plan with patient and/or surrogate as well as nursing, discussions with consultants, evaluation of patient's response to treatment, examination of patient, obtaining history from patient or surrogate, ordering and performing treatments and interventions, ordering and review of laboratory studies, ordering and review of radiographic studies, pulse oximetry and re-evaluation of patient's condition.   Johnsie Cancel, NP-C [facility] Contact / Pager information can be found on Amion  07/10/2019, 8:00 AM

## 2019-07-10 NOTE — Progress Notes (Signed)
Subjective: He feels more dyspneic today   Antibiotics:  Anti-infectives (From admission, onward)   Start     Dose/Rate Route Frequency Ordered Stop   07/10/19 1800  meropenem (MERREM) 1 g in sodium chloride 0.9 % 100 mL IVPB     1 g 200 mL/hr over 30 Minutes Intravenous Every 8 hours 07/10/19 1425     07/10/19 0900  sulfamethoxazole-trimethoprim (BACTRIM) 450 mg in dextrose 5 % 500 mL IVPB     450 mg 352.1 mL/hr over 90 Minutes Intravenous Every 8 hours 07/10/19 0813     07/09/19 1000  fluconazole (DIFLUCAN) tablet 100 mg     100 mg Oral Daily 07/08/19 1919     07/09/19 0200  vancomycin (VANCOCIN) IVPB 1000 mg/200 mL premix  Status:  Discontinued     1,000 mg 200 mL/hr over 60 Minutes Intravenous Every 12 hours 07/08/19 1930 07/10/19 1017   07/08/19 2200  ceFEPIme (MAXIPIME) 2 g in sodium chloride 0.9 % 100 mL IVPB  Status:  Discontinued     2 g 200 mL/hr over 30 Minutes Intravenous Every 8 hours 07/08/19 1929 07/10/19 1425   07/08/19 2000  fluconazole (DIFLUCAN) tablet 200 mg     200 mg Oral  Once 07/08/19 1919 07/08/19 2005   07/08/19 1800  azithromycin (ZITHROMAX) 500 mg in sodium chloride 0.9 % 250 mL IVPB     500 mg 250 mL/hr over 60 Minutes Intravenous Every 24 hours 07/08/19 1736 07/13/19 1759   07/08/19 1345  ceFEPIme (MAXIPIME) 2 g in sodium chloride 0.9 % 100 mL IVPB     2 g 200 mL/hr over 30 Minutes Intravenous STAT 07/08/19 1343 07/08/19 1631   07/08/19 1345  vancomycin (VANCOREADY) IVPB 1500 mg/300 mL     1,500 mg 150 mL/hr over 120 Minutes Intravenous STAT 07/08/19 1343 07/08/19 1631      Medications: Scheduled Meds:  atorvastatin  10 mg Oral Daily   Chlorhexidine Gluconate Cloth  6 each Topical Daily   enoxaparin (LOVENOX) injection  40 mg Subcutaneous Q24H   feeding supplement (ENSURE ENLIVE)  237 mL Oral BID BM   feeding supplement (PRO-STAT SUGAR FREE 64)  30 mL Oral BID   ferrous sulfate  325 mg Oral Q breakfast   fluconazole  100 mg  Oral Daily   insulin aspart  0-9 Units Subcutaneous TID WC   insulin aspart  3 Units Subcutaneous TID WC   levalbuterol       levalbuterol  1.25 mg Nebulization Q6H   mouth rinse  15 mL Mouth Rinse BID   multivitamin with minerals  2 tablet Oral Daily   pantoprazole (PROTONIX) IV  40 mg Intravenous QHS   predniSONE  40 mg Oral BID   Followed by   Derrill Memo ON 07/15/2019] predniSONE  40 mg Oral Q breakfast   Followed by   Derrill Memo ON 07/20/2019] predniSONE  20 mg Oral Q breakfast   sodium chloride flush  10-40 mL Intracatheter Q12H   Continuous Infusions:  azithromycin Stopped (07/09/19 1837)   meropenem (MERREM) IV     sulfamethoxazole-trimethoprim Stopped (07/10/19 1127)   PRN Meds:.acetaminophen, acetaminophen, lip balm, magic mouthwash w/lidocaine, melatonin, ondansetron (ZOFRAN) IV, sodium chloride    Objective: Weight change:   Intake/Output Summary (Last 24 hours) at 07/10/2019 1553 Last data filed at 07/10/2019 1405 Gross per 24 hour  Intake 2297.42 ml  Output 1150 ml  Net 1147.42 ml   Blood pressure 101/63, pulse 90, temperature 97.7  F (36.5 C), temperature source Axillary, resp. rate (!) 22, height 5\' 6"  (1.676 m), weight 74.4 kg, SpO2 97 %. Temp:  [96.6 F (35.9 C)-102.6 F (39.2 C)] 97.7 F (36.5 C) (03/31 1208) Pulse Rate:  [78-103] 90 (03/31 1545) Resp:  [20-43] 22 (03/31 1545) BP: (97-143)/(54-80) 101/63 (03/31 1300) SpO2:  [73 %-100 %] 97 % (03/31 1545) FiO2 (%):  [60 %-100 %] 100 % (03/31 1545)  Physical Exam: General: Alert and awake, oriented x3,  HEENT: anicteric sclera, EOMI CVS tachycardic Chest: , Tachypneic Abdomen: soft non-distended,  Extremities: no edema or deformity noted bilaterally Skin: no rashes Neuro: nonfocal  CBC:    BMET Recent Labs    07/09/19 0510 07/10/19 0605  NA 141 143  K 3.7 3.5  CL 107 108  CO2 25 27  GLUCOSE 167* 146*  BUN 23 27*  CREATININE 0.82 0.81  CALCIUM 8.0* 7.9*     Liver  Panel  Recent Labs    07/08/19 1244 07/09/19 0510  PROT 7.3 6.3*  ALBUMIN 2.4* 2.0*  AST 25 23  ALT 12 13  ALKPHOS 85 77  BILITOT 1.1 1.0       Sedimentation Rate No results for input(s): ESRSEDRATE in the last 72 hours. C-Reactive Protein No results for input(s): CRP in the last 72 hours.  Micro Results: Recent Results (from the past 720 hour(s))  Blood Culture (routine x 2)     Status: None (Preliminary result)   Collection Time: 07/08/19  1:20 PM   Specimen: BLOOD  Result Value Ref Range Status   Specimen Description   Final    BLOOD RIGHT ANTECUBITAL Performed at [facility] [address], [place], [place] [address]    Special Requests   Final    BOTTLES DRAWN AEROBIC AND ANAEROBIC Blood Culture adequate volume Performed at [facility] [address] [place], [place] [address]    Culture   Final    NO GROWTH 2 DAYS Performed at [facility] [address], [place], [place] [address]    Report Status PENDING  Incomplete  Blood Culture (routine x 2)     Status: None (Preliminary result)   Collection Time: 07/08/19  1:25 PM   Specimen: BLOOD  Result Value Ref Range Status   Specimen Description   Final    BLOOD RIGHT ANTECUBITAL Performed at [facility] [address] [place], [place] [address]    Special Requests   Final    BOTTLES DRAWN AEROBIC AND ANAEROBIC Blood Culture adequate volume Performed at [facility] [address], [place], [place] [address]    Culture   Final    NO GROWTH 2 DAYS Performed at [facility] [address] [place], [place] [address]    Report Status PENDING  Incomplete  Urine culture     Status: None   Collection Time: 07/08/19  6:30 PM   Specimen: In/Out Cath Urine  Result Value Ref Range Status   Specimen Description   Final    IN/OUT CATH URINE Performed at [facility] [address],  [place], [place] [address]    Special Requests   Final    NONE Performed at [facility], [place] [address], [place], [place] [address]    Culture   Final    NO GROWTH Performed at [facility], [place] [address], [place], [place] [address]    Report Status 07/09/2019  FINAL  Final  MRSA PCR Screening     Status: None   Collection Time: 07/08/19  9:29 PM   Specimen: Nasopharyngeal  Result Value Ref Range Status   MRSA by PCR NEGATIVE NEGATIVE Final    Comment:        The GeneXpert MRSA Assay (FDA approved for NASAL specimens only), is one component of a comprehensive MRSA colonization surveillance program. It is not intended to diagnose MRSA infection nor to guide or monitor treatment for MRSA infections. Performed at [facility], [place] [address] [place],  [address]     Studies/Results: CT ANGIO CHEST PE W OR WO CONTRAST  Result Date: 07/08/2019 CLINICAL DATA:  Hypoxemia, history of COVID-19 positivity 2 months ago EXAM: CT ANGIOGRAPHY CHEST WITH CONTRAST TECHNIQUE: Multidetector CT imaging of the chest was performed using the standard protocol during bolus administration of intravenous contrast. Multiplanar CT image reconstructions and MIPs were obtained to evaluate the vascular anatomy. CONTRAST:  22mL OMNIPAQUE IOHEXOL 350 MG/ML SOLN COMPARISON:  Chest x-ray from earlier in the same day and prior PET-CT from 06/14/2019 FINDINGS: Cardiovascular: Thoracic aorta demonstrates a normal enhancement pattern. No aneurysmal dilatation or dissection is noted. No cardiac enlargement is seen. Pulmonary artery shows a normal branching pattern. No definitive intraluminal filling defect is identified to suggest pulmonary embolism. The peripheral branches are somewhat limited due to the significant overlying parenchymal abnormality. Mediastinum/Nodes: Thoracic inlet again shows a stable right supraclavicular lymph node measuring approximately 12 mm in short  axis. Right pretracheal/paratracheal lymph node is again noted and relatively stable from the prior exam consistent with the patient's given clinical history of Hodgkin's lymphoma. Scattered smaller mediastinal nodes are seen. Scattered hilar nodes are noted but smaller in size. Lungs/Pleura: Lungs again demonstrates some peripheral subpleural fibrotic changes consistent with the known history of prior COVID-19 positivity. These have been stable for several exams. There is however new superimposed ground-glass opacity throughout both lungs most consistent with pulmonary edema given its rapid onset. No sizable effusion is seen. No definitive parenchymal nodules are noted at this time although evaluation is somewhat limited. Upper Abdomen: Visualized upper abdomen appears within normal limits. Musculoskeletal: Degenerative changes of the thoracic spine are noted. No acute rib abnormality is seen. Review of the MIP images confirms the above findings. IMPRESSION: Diffuse widespread pulmonary opacity which is acute in nature superimposed over more chronic subpleural fibrotic changes that were noted on prior CT. These changes most likely represent acute pulmonary edema given the rapid onset. Stable right supraclavicular and mediastinal lymph nodes similar to that seen on prior CT examination. No other vascular abnormality is noted. Electronically Signed   By: Inez Catalina M.D.   On: 07/08/2019 19:53   ECHOCARDIOGRAM COMPLETE  Result Date: 07/09/2019    ECHOCARDIOGRAM REPORT   Patient Name:   Steve [name] [facility] Date of Exam: 07/09/2019 Medical Rec #:  [id number]         Height:       66.0 in Accession #:    [id number]        Weight:       164.0 lb Date of Birth:  [birth year]          BSA:          1.838 m Patient Age:    64 years          BP:           113/73 mmHg Patient Gender: M  HR:           98 bpm. Exam Location:  Inpatient Procedure: 2D Echo, Cardiac Doppler and Color Doppler Indications:    Dyspnea  [phone] / R06.00  History:        Patient has prior history of Echocardiogram examinations, most                 recent 04/01/2019. Risk Factors:Hypertension, Diabetes, Sleep                 Apnea and GERD. COVID19. Lymphoma.  Sonographer:    Tiffany Dance Referring Phys: [place]  1. Left ventricular ejection fraction, by estimation, is 60 to 65%. The left ventricle has normal function. The left ventricle has no regional wall motion abnormalities. Left ventricular diastolic parameters are consistent with Grade I diastolic dysfunction (impaired relaxation).  2. Right ventricular systolic function is normal. The right ventricular size is normal.  3. Left atrial size was mildly dilated.  4. The mitral valve is grossly normal. Trivial mitral valve regurgitation.  5. The aortic valve is tricuspid. Aortic valve regurgitation is not visualized.  6. The inferior vena cava is normal in size with greater than 50% respiratory variability, suggesting right atrial pressure of 3 mmHg.  7. Cannot exclude small PFO. Comparison(s): Changes from prior study are noted. 04/01/2019: LVEF 55-60%. FINDINGS  Left Ventricle: Left ventricular ejection fraction, by estimation, is 60 to 65%. The left ventricle has normal function. The left ventricle has no regional wall motion abnormalities. The left ventricular internal cavity size was normal in size. There is  no left ventricular hypertrophy. Left ventricular diastolic parameters are consistent with Grade I diastolic dysfunction (impaired relaxation). Indeterminate filling pressures. Right Ventricle: The right ventricular size is normal. No increase in right ventricular wall thickness. Right ventricular systolic function is normal. Left Atrium: Left atrial size was mildly dilated. Right Atrium: Right atrial size was normal in size. Pericardium: There is no evidence of pericardial effusion. Mitral Valve: The mitral valve is grossly normal. Trivial mitral valve  regurgitation. Tricuspid Valve: The tricuspid valve is grossly normal. Tricuspid valve regurgitation is trivial. Aortic Valve: The aortic valve is tricuspid. Aortic valve regurgitation is not visualized. Pulmonic Valve: The pulmonic valve was normal in structure. Pulmonic valve regurgitation is not visualized. Aorta: The aortic root and ascending aorta are structurally normal, with no evidence of dilitation. Venous: The inferior vena cava is normal in size with greater than 50% respiratory variability, suggesting right atrial pressure of 3 mmHg. IAS/Shunts: The interatrial septum is aneurysmal. Cannot exclude small PFO.  LEFT VENTRICLE PLAX 2D LVIDd:         4.70 cm  Diastology LVIDs:         2.90 cm  LV e' lateral:   12.30 cm/s LV PW:         0.90 cm  LV E/e' lateral: 7.1 LV IVS:        0.70 cm  LV e' medial:    7.40 cm/s LVOT diam:     1.90 cm  LV E/e' medial:  11.8 LV SV:         65 LV SV Index:   35 LVOT Area:     2.84 cm  RIGHT VENTRICLE             IVC RV Basal diam:  2.60 cm     IVC diam: 1.70 cm RV S prime:     20.00 cm/s TAPSE (M-mode): 2.7 cm LEFT  ATRIUM             Index       RIGHT ATRIUM           Index LA diam:        4.10 cm 2.23 cm/m  RA Area:     15.50 cm LA Vol (A2C):   63.0 ml 34.27 ml/m RA Volume:   38.60 ml  21.00 ml/m LA Vol (A4C):   64.5 ml 35.09 ml/m LA Biplane Vol: 63.8 ml 34.71 ml/m  AORTIC VALVE LVOT Vmax:   126.00 cm/s LVOT Vmean:  75.400 cm/s LVOT VTI:    0.228 m  AORTA Ao Root diam: 3.80 cm Ao Asc diam:  3.40 cm MITRAL VALVE MV Area (PHT): 3.42 cm     SHUNTS MV Decel Time: 222 msec     Systemic VTI:  0.23 m MV E velocity: 87.30 cm/s   Systemic Diam: 1.90 cm MV A velocity: 118.00 cm/s MV E/A ratio:  0.74 Lyman Bishop MD Electronically signed by Lyman Bishop MD Signature Date/Time: 07/09/2019/11:52:36 AM    Final    VAS [place] LOWER EXTREMITY VENOUS (DVT)  Result Date: 07/09/2019  Lower Venous DVTStudy Indications: Dyspnea.  Risk Factors: Cancer. Comparison Study: No prior  studies. Performing Technologist: Oliver Hum RVT  Examination Guidelines: A complete evaluation includes B-mode imaging, spectral Doppler, color Doppler, and power Doppler as needed of all accessible portions of each vessel. Bilateral testing is considered an integral part of a complete examination. Limited examinations for reoccurring indications may be performed as noted. The reflux portion of the exam is performed with the patient in reverse Trendelenburg.  +---------+---------------+---------+-----------+----------+--------------+  RIGHT     Compressibility Phasicity Spontaneity Properties Thrombus Aging  +---------+---------------+---------+-----------+----------+--------------+  CFV       Full            Yes       Yes                                    +---------+---------------+---------+-----------+----------+--------------+  SFJ       Full                                                             +---------+---------------+---------+-----------+----------+--------------+  FV Prox   Full                                                             +---------+---------------+---------+-----------+----------+--------------+  FV Mid    Full                                                             +---------+---------------+---------+-----------+----------+--------------+  FV Distal Full                                                             +---------+---------------+---------+-----------+----------+--------------+  PFV       Full                                                             +---------+---------------+---------+-----------+----------+--------------+  POP       Full            Yes       Yes                                    +---------+---------------+---------+-----------+----------+--------------+  PTV       Full                                                             +---------+---------------+---------+-----------+----------+--------------+  PERO      Full                                                              +---------+---------------+---------+-----------+----------+--------------+   +---------+---------------+---------+-----------+----------+--------------+  LEFT      Compressibility Phasicity Spontaneity Properties Thrombus Aging  +---------+---------------+---------+-----------+----------+--------------+  CFV       Full            Yes       Yes                                    +---------+---------------+---------+-----------+----------+--------------+  SFJ       Full                                                             +---------+---------------+---------+-----------+----------+--------------+  FV Prox   Full                                                             +---------+---------------+---------+-----------+----------+--------------+  FV Mid    Full                                                             +---------+---------------+---------+-----------+----------+--------------+  FV Distal Full                                                             +---------+---------------+---------+-----------+----------+--------------+  PFV       Full                                                             +---------+---------------+---------+-----------+----------+--------------+  POP       Full            Yes       Yes                                    +---------+---------------+---------+-----------+----------+--------------+  PTV       Full                                                             +---------+---------------+---------+-----------+----------+--------------+  PERO      Full                                                             +---------+---------------+---------+-----------+----------+--------------+     Summary: RIGHT: - There is no evidence of deep vein thrombosis in the lower extremity.  - No cystic structure found in the popliteal fossa.  LEFT: - There is no evidence of deep vein thrombosis in the lower extremity.  - No cystic  structure found in the popliteal fossa.  *See table(s) above for measurements and observations. Electronically signed by Deitra Mayo MD on 07/09/2019 at 5:04:18 PM.    Final       Assessment/Plan:  INTERVAL HISTORY: LDH was elevated and Bactrim and corticosteroid therapy was started   Active Problems:   Malnutrition of moderate degree   HCAP (healthcare-associated pneumonia)   Non-Hodgkin's lymphoma ([place])   Hypoxia    Steve [name] is a 64 y.o. male with Hodgkin's lymphoma on chemotherapy, with Covid pneumonia in February and supplemental oxygen requirements afterwards who presents with acute on chronic respiratory failure with bilateral diffuse pulmonary infiltrates.  1..Bilateral diffuse pulmonary infiltrates: Agree with continuing therapy with Bactrim and steroids.  We will broaden to meropenem to provide more aggressive therapy for possible nocardia.  Continue azithromycin in the interim as well  We will follow up on antigen studies for histoplasma, Blastomyces and Legionella antigen  I agree that if he could safely have a bronchoscopy with specimens sent for cultures, and possible biopsy this could be helpful though obviously risk for him requiring intubation high at present and therefore CCM holding off on this in hopes his O2 requirements will go down andit could be more safely pursued possibly  2 Thrush: fluconazole   LOS: 2 days   Alcide Evener 07/10/2019, 3:53 PM

## 2019-07-10 NOTE — Progress Notes (Signed)
eLink Physician-Brief Progress Note Patient Name: Steve [name] DOB: 1955/06/21 MRN: [id number]   Date of Service  07/10/2019  HPI/Events of Note  Patient requests sleep aid.   eICU Interventions  Will order: 1. Nursing to ask pharmacy to enter order for Melatonin 3 mg PO Q HS PRN sleep.     Intervention Category Major Interventions: Other:  [name] 07/10/2019, 1:54 AM

## 2019-07-10 NOTE — Progress Notes (Signed)
eLink Physician-Brief Progress Note Patient Name: Steve [name] DOB: [birth year] MRN: [id number]   Date of Service  07/10/2019  HPI/Events of Note  Fever to 102.6 F - Patient is already on antibiotics. AST and ALT are normal.   eICU Interventions  Will order: 1. Tylenol Suppository 650 mg PR Q 6 hours PRN TEMP > 101.0 F.      Intervention Category Major Interventions: Other:  [name] 07/10/2019, 6:29 AM

## 2019-07-11 ENCOUNTER — Inpatient Hospital Stay (HOSPITAL_COMMUNITY): Payer: BC Managed Care – PPO

## 2019-07-11 DIAGNOSIS — J9620 Acute and chronic respiratory failure, unspecified whether with hypoxia or hypercapnia: Secondary | ICD-10-CM | POA: Diagnosis not present

## 2019-07-11 DIAGNOSIS — E785 Hyperlipidemia, unspecified: Secondary | ICD-10-CM

## 2019-07-11 DIAGNOSIS — R918 Other nonspecific abnormal finding of lung field: Secondary | ICD-10-CM | POA: Diagnosis not present

## 2019-07-11 DIAGNOSIS — B379 Candidiasis, unspecified: Secondary | ICD-10-CM | POA: Diagnosis not present

## 2019-07-11 DIAGNOSIS — B59 Pneumocystosis: Principal | ICD-10-CM

## 2019-07-11 DIAGNOSIS — C8190 Hodgkin lymphoma, unspecified, unspecified site: Secondary | ICD-10-CM | POA: Diagnosis not present

## 2019-07-11 DIAGNOSIS — E1165 Type 2 diabetes mellitus with hyperglycemia: Secondary | ICD-10-CM | POA: Diagnosis not present

## 2019-07-11 DIAGNOSIS — J9621 Acute and chronic respiratory failure with hypoxia: Secondary | ICD-10-CM | POA: Diagnosis not present

## 2019-07-11 DIAGNOSIS — C8118 Nodular sclerosis classical Hodgkin lymphoma, lymph nodes of multiple sites: Secondary | ICD-10-CM | POA: Diagnosis not present

## 2019-07-11 LAB — CBC
HCT: 24.4 % — ABNORMAL LOW (ref 39.0–52.0)
Hemoglobin: 7.6 g/dL — ABNORMAL LOW (ref 13.0–17.0)
MCH: 29.1 pg (ref 26.0–34.0)
MCHC: 31.1 g/dL (ref 30.0–36.0)
MCV: 93.5 fL (ref 80.0–100.0)
Platelets: 194 10*3/uL (ref 150–400)
RBC: 2.61 MIL/uL — ABNORMAL LOW (ref 4.22–5.81)
RDW: 21.1 % — ABNORMAL HIGH (ref 11.5–15.5)
WBC: 25.8 10*3/uL — ABNORMAL HIGH (ref 4.0–10.5)
nRBC: 0.3 % — ABNORMAL HIGH (ref 0.0–0.2)

## 2019-07-11 LAB — BRAIN NATRIURETIC PEPTIDE: B Natriuretic Peptide: 224.8 pg/mL — ABNORMAL HIGH (ref 0.0–100.0)

## 2019-07-11 LAB — TYPE AND SCREEN
ABO/RH(D): A NEG
Antibody Screen: NEGATIVE

## 2019-07-11 LAB — BASIC METABOLIC PANEL
Anion gap: 7 (ref 5–15)
BUN: 23 mg/dL (ref 8–23)
CO2: 25 mmol/L (ref 22–32)
Calcium: 7.8 mg/dL — ABNORMAL LOW (ref 8.9–10.3)
Chloride: 107 mmol/L (ref 98–111)
Creatinine, Ser: 0.76 mg/dL (ref 0.61–1.24)
GFR calc Af Amer: >60 mL/min (ref >60)
GFR calc non Af Amer: >60 mL/min (ref >60)
Glucose, Bld: 161 mg/dL — ABNORMAL HIGH (ref 70–99)
Potassium: 3.9 mmol/L (ref 3.5–5.1)
Sodium: 139 mmol/L (ref 135–145)

## 2019-07-11 LAB — GLUCOSE, CAPILLARY
Glucose-Capillary: 140 mg/dL — ABNORMAL HIGH (ref 70–99)
Glucose-Capillary: 207 mg/dL — ABNORMAL HIGH (ref 70–99)
Glucose-Capillary: 155 mg/dL — ABNORMAL HIGH (ref 70–99)
Glucose-Capillary: 223 mg/dL — ABNORMAL HIGH (ref 70–99)

## 2019-07-11 LAB — HIV-1 RNA QUANT-NO REFLEX-BLD
HIV 1 RNA Quant: <20 copies/mL
LOG10 HIV-1 RNA: UNDETERMINED log10copy/mL

## 2019-07-11 LAB — LEGIONELLA PNEUMOPHILA SEROGP 1 UR AG: L. pneumophila Serogp 1 Ur Ag: NEGATIVE

## 2019-07-11 MED ORDER — CHLORHEXIDINE GLUCONATE CLOTH 2 % EX PADS
6.0000 | MEDICATED_PAD | Freq: Every day | CUTANEOUS | Status: DC
  Administered 2019-07-11 – 2019-08-28 (×51): 6 via TOPICAL

## 2019-07-11 MED ORDER — FUROSEMIDE 10 MG/ML IJ SOLN
40.0000 mg | Freq: Once | INTRAMUSCULAR | Status: AC
  Administered 2019-07-11: 09:00:00 40 mg via INTRAVENOUS
  Filled 2019-07-11: qty 4

## 2019-07-11 MED ORDER — FUROSEMIDE 10 MG/ML IJ SOLN
40.0000 mg | Freq: Once | INTRAMUSCULAR | Status: AC
  Administered 2019-07-11: 40 mg via INTRAVENOUS
  Filled 2019-07-11: qty 4

## 2019-07-11 MED ORDER — IPRATROPIUM-ALBUTEROL 0.5-2.5 (3) MG/3ML IN SOLN
3.0000 mL | RESPIRATORY_TRACT | Status: DC
  Administered 2019-07-11 – 2019-07-20 (×53): 3 mL via RESPIRATORY_TRACT
  Filled 2019-07-11 (×51): qty 3

## 2019-07-11 MED ORDER — METHYLPREDNISOLONE SODIUM SUCC 125 MG IJ SOLR
60.0000 mg | Freq: Two times a day (BID) | INTRAMUSCULAR | Status: DC
  Administered 2019-07-11 – 2019-07-24 (×27): 60 mg via INTRAVENOUS
  Filled 2019-07-11 (×27): qty 2

## 2019-07-11 NOTE — Progress Notes (Signed)
eLink Physician-Brief Progress Note Patient Name: ALIZE ACY DOB: Nov 30, 1955 MRN: [id number]   Date of Service  07/11/2019  HPI/Events of Note  Ca++ = 7.8 which corrects to 9.4 (Normal) given the albumin = 2.0.   eICU Interventions  No intervention is indicated.      Intervention Category Major Interventions: Electrolyte abnormality - evaluation and management  Dalexa Gentz [place] 07/11/2019, 4:34 AM

## 2019-07-11 NOTE — Progress Notes (Signed)
Subjective: He felt worse this am. To me looks much more labored w breathing when I saw him early afternoon   Antibiotics:  Anti-infectives (From admission, onward)   Start     Dose/Rate Route Frequency Ordered Stop   07/10/19 1800  meropenem (MERREM) 1 g in sodium chloride 0.9 % 100 mL IVPB     1 g 200 mL/hr over 30 Minutes Intravenous Every 8 hours 07/10/19 1425     07/10/19 0900  sulfamethoxazole-trimethoprim (BACTRIM) 450 mg in dextrose 5 % 500 mL IVPB     450 mg 352.1 mL/hr over 90 Minutes Intravenous Every 8 hours 07/10/19 0813     07/09/19 1000  fluconazole (DIFLUCAN) tablet 100 mg     100 mg Oral Daily 07/08/19 1919     07/09/19 0200  vancomycin (VANCOCIN) IVPB 1000 mg/200 mL premix  Status:  Discontinued     1,000 mg 200 mL/hr over 60 Minutes Intravenous Every 12 hours 07/08/19 1930 07/10/19 1017   07/08/19 2200  ceFEPIme (MAXIPIME) 2 g in sodium chloride 0.9 % 100 mL IVPB  Status:  Discontinued     2 g 200 mL/hr over 30 Minutes Intravenous Every 8 hours 07/08/19 1929 07/10/19 1425   07/08/19 2000  fluconazole (DIFLUCAN) tablet 200 mg     200 mg Oral  Once 07/08/19 1919 07/08/19 2005   07/08/19 1800  azithromycin (ZITHROMAX) 500 mg in sodium chloride 0.9 % 250 mL IVPB     500 mg 250 mL/hr over 60 Minutes Intravenous Every 24 hours 07/08/19 1736 07/13/19 1759   07/08/19 1345  ceFEPIme (MAXIPIME) 2 g in sodium chloride 0.9 % 100 mL IVPB     2 g 200 mL/hr over 30 Minutes Intravenous STAT 07/08/19 1343 07/08/19 1631   07/08/19 1345  vancomycin (VANCOREADY) IVPB 1500 mg/300 mL     1,500 mg 150 mL/hr over 120 Minutes Intravenous STAT 07/08/19 1343 07/08/19 1631      Medications: Scheduled Meds: . atorvastatin  10 mg Oral Daily  . Chlorhexidine Gluconate Cloth  6 each Topical Daily  . enoxaparin (LOVENOX) injection  40 mg Subcutaneous Q24H  . feeding supplement (ENSURE ENLIVE)  237 mL Oral BID BM  . feeding supplement (PRO-STAT SUGAR FREE 64)  30 mL Oral BID    . ferrous sulfate  325 mg Oral Q breakfast  . fluconazole  100 mg Oral Daily  . insulin aspart  0-9 Units Subcutaneous TID WC  . insulin aspart  5 Units Subcutaneous TID WC  . ipratropium-albuterol  3 mL Nebulization Q4H  . mouth rinse  15 mL Mouth Rinse BID  . methylPREDNISolone (SOLU-MEDROL) injection  60 mg Intravenous BID  . multivitamin with minerals  2 tablet Oral Daily  . pantoprazole (PROTONIX) IV  40 mg Intravenous QHS  . sodium chloride flush  10-40 mL Intracatheter Q12H   Continuous Infusions: . azithromycin Stopped (07/10/19 1911)  . meropenem (MERREM) IV Stopped (07/11/19 1003)  . sulfamethoxazole-trimethoprim Stopped (07/11/19 1204)   PRN Meds:.acetaminophen, acetaminophen, lip balm, magic mouthwash w/lidocaine, melatonin, ondansetron (ZOFRAN) IV, sodium chloride    Objective: Weight change:   Intake/Output Summary (Last 24 hours) at 07/11/2019 1703 Last data filed at 07/11/2019 1600 Gross per 24 hour  Intake 2368.61 ml  Output 3950 ml  Net -1581.39 ml   Blood pressure 122/77, pulse 76, temperature (!) 97.4 F (36.3 C), temperature source Axillary, resp. rate (!) 29, height 5\' 6"  (1.676 m), weight 77.7 kg, SpO2 94 %.  Temp:  [97 F (36.1 C)-97.8 F (36.6 C)] 97.4 F (36.3 C) (04/01 1200) Pulse Rate:  [76-114] 76 (04/01 1600) Resp:  [22-41] 29 (04/01 1600) BP: (115-169)/(66-132) 122/77 (04/01 1600) SpO2:  [80 %-100 %] 94 % (04/01 1600) FiO2 (%):  [60 %-100 %] 90 % (04/01 1600) Weight:  [77.7 kg] 77.7 kg (04/01 0500)  Physical Exam: General: Alert and awake, oriented x3,  HEENT: anicteric sclera, EOMI CVS tachycardic Chest: , Tachypneic more labored breathing Abdomen: soft non-distended,  Extremities: no edema or deformity noted bilaterally Skin: no rashes Neuro: nonfocal  CBC:    BMET Recent Labs    07/10/19 0605 07/11/19 0212  NA 143 139  K 3.5 3.9  CL 108 107  CO2 27 25  GLUCOSE 146* 161*  BUN 27* 23  CREATININE 0.81 0.76  CALCIUM 7.9*  7.8*     Liver Panel  Recent Labs    07/09/19 0510  PROT 6.3*  ALBUMIN 2.0*  AST 23  ALT 13  ALKPHOS 77  BILITOT 1.0       Sedimentation Rate No results for input(s): ESRSEDRATE in the last 72 hours. C-Reactive Protein No results for input(s): CRP in the last 72 hours.  Micro Results: Recent Results (from the past 720 hour(s))  Blood Culture (routine x 2)     Status: None (Preliminary result)   Collection Time: 07/08/19  1:20 PM   Specimen: BLOOD  Result Value Ref Range Status   Specimen Description   Final    BLOOD RIGHT ANTECUBITAL Performed at [facility] [address], [place], [place] [address]    Special Requests   Final    BOTTLES DRAWN AEROBIC AND ANAEROBIC Blood Culture adequate volume Performed at [facility] [address], [place], [place] [address]    Culture   Final    NO GROWTH 3 DAYS Performed at [facility], [place] [address], [place], [place] [address]    Report Status PENDING  Incomplete  Blood Culture (routine x 2)     Status: None (Preliminary result)   Collection Time: 07/08/19  1:25 PM   Specimen: BLOOD  Result Value Ref Range Status   Specimen Description   Final    BLOOD RIGHT ANTECUBITAL Performed at [facility] [address] [place], [place] [address]    Special Requests   Final    BOTTLES DRAWN AEROBIC AND ANAEROBIC Blood Culture adequate volume Performed at [facility] [address] [place], [place] [address]    Culture   Final    NO GROWTH 3 DAYS Performed at [facility], [place] [address], [place], [place] [address]    Report Status PENDING  Incomplete  Urine culture     Status: None   Collection Time: 07/08/19  6:30 PM   Specimen: In/Out Cath Urine  Result Value Ref Range Status   Specimen Description   Final    IN/OUT CATH URINE Performed at [facility] [address] [name], [place] [address]     Special Requests   Final    NONE Performed at [facility], [place] [address], [place], [place] [address]    Culture   Final    NO GROWTH Performed at [facility], [place] [address] [place], [place] [address]    Report Status 07/09/2019 FINAL  Final  MRSA PCR Screening     Status: None   Collection Time: 07/08/19  9:29 PM   Specimen: Nasopharyngeal  Result Value Ref Range Status   MRSA by PCR NEGATIVE NEGATIVE Final    Comment:        The GeneXpert MRSA Assay (FDA approved for NASAL specimens only), is one component of a comprehensive MRSA colonization surveillance program. It is not intended to diagnose MRSA infection nor to guide or monitor treatment for MRSA infections. Performed at [facility], [place] [address], [place], [place] [address]     Studies/Results: DG CHEST PORT 1 VIEW  Result Date: 07/11/2019 CLINICAL DATA:  Continued hypoxia post COVID infection. EXAM: PORTABLE CHEST 1 VIEW COMPARISON:  CTA chest and chest x-ray dated July 08, 2019. FINDINGS: Unchanged left chest wall port catheter. Stable mild cardiomegaly. Diffuse bilateral interstitial and hazy airspace opacities throughout both lungs appear relatively similar to the prior study. No pleural effusion or pneumothorax. No acute osseous abnormality. IMPRESSION: Relatively unchanged extensive bilateral airspace disease. Electronically Signed   By: Titus Dubin M.D.   On: 07/11/2019 09:10      Assessment/Plan:  INTERVAL HISTORY: LDH was elevated and Bactrim and corticosteroid therapy was started   Active Problems:   Hodgkin's lymphoma ([name])   Malnutrition of moderate degree   HCAP (healthcare-associated pneumonia)   Hypoxia   Acute on chronic respiratory failure ([name])   Diabetes mellitus ([name])    Steve [name] is a 64 y.o. male with Hodgkin's lymphoma on chemotherapy, with Covid pneumonia in February and supplemental oxygen requirements afterwards who  presents with acute on chronic respiratory failure with bilateral diffuse pulmonary infiltrates.  Serum crypto ag, urine histo ag negative, legionella ag negative  1..Bilateral diffuse pulmonary infiltrates: Agree with continuing therapy with Bactrim and steroids., merrem  I will DC macrolide  He looks to me as if he will soon up needing mechanical ventilation. If so we can get cultures biopsy to help guide therapy   2 Thrush: fluconazole   LOS: 3 days   Alcide Evener 07/11/2019, 5:03 PM

## 2019-07-11 NOTE — Progress Notes (Signed)
NAME:  Steve [name], MRN:  [id number], DOB:  [birth year], LOS: 3 ADMISSION DATE:  07/08/2019, CONSULTATION DATE:  3/29 REFERRING MD:  Steve [name], CHIEF COMPLAINT:  hypoxia   Brief History   Hodgkin's lymphoma Covid in Feb 2021, chronic hypoxic respiratory failure on 2L HOT since Acute worsening of respiratory failure  History of present illness   Steve [name] is a 64 y/o gentleman with a history of Hodgkin's lymphoma (stage III+, diagnosed 02/2019) on chemotherapy with AVD + Adcetris (brentuximab vedotin, doxorubicin, vinblastine, dacarbazine). He follows with Dr. Maylon [name] in Oncology. His most recent treatment was about 3 weeks ago (06/17/19). Due to worsening respiratory status and inability to tolerate PO intake recently, he had his chemo delayed this week.  He has a history of neutropenic fever in the past. He receives G-CSF post-chemo. He had a good response to chemo on his first follow up PET scan.   His appetite has been poor and he has been receiving IV fluids. For the past few weeks his dyspnea has been worsening, and today he had saturations in the 50s at home on his supplemental oxygen, which he has been increasing recently on his home concentrator. He endorses sore throat and having white membrane in his throat. No cough, sputum production, edema, CP, abdominal distention, n/v/d. No change over port site or tract. No family history of lung disease.  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 Left ventricular ejection fraction, by estimation, is 60 to 65%. The  left ventricle has normal function. The left ventricle has no regional  wall motion abnormalities. Left ventricular diastolic parameters are  consistent with Grade  I diastolic  dysfunction (impaired relaxation).  2. Right ventricular systolic function is normal. The right ventricular  size is normal.  3. Left atrial size was mildly dilated.  4. The mitral valve is grossly normal. Trivial mitral valve  regurgitation.  5. The aortic valve is tricuspid. Aortic valve regurgitation is not  visualized.  6. The inferior vena cava is normal in size with greater than 50%  respiratory variability, suggesting right atrial pressure of 3 mmHg.  7. Cannot exclude small PFO.   Micro Data:  3/29 blood cultures 3/29 urine>> NG, final Crypto Ag negative Strep pneumo Ag negative History Ag urine negative Legionella negative  Antimicrobials:  Cefepime 3/29>> vanc 3/29>> Azithromycin 3/29>>4/1 Fluconazole 3/29>>  Interim history/subjective:  More short of breath this morning, improved after Lasix  Objective   Blood pressure 138/85, pulse 82, temperature (!) 97 F (36.1 C), temperature source Axillary, resp. rate (!) 23, height 5\' 6"  (1.676 m), weight 77.7 kg, SpO2 96 %.    FiO2 (%):  [60 %-100 %] 100 %   Intake/Output Summary (Last 24 hours) at 07/11/2019 1928 Last data filed at 07/11/2019 1758 Gross per 24 hour  Intake 2021.23 ml  Output 4350 ml  Net -2328.77 ml   Filed Weights   07/08/19 2207 07/09/19 0500 07/11/19 0500  Weight: 74.4 kg 74.4 kg 77.7 kg    Examination: General: Critically ill-appearing man sitting up in bed watching TV HEENT: Steve [name]/AT, eyes anicteric.  Dry, cracked lips. Neuro: Alert, answering questions appropriately.  Moving all extremities spontaneously.  Globally weak. CV: Rate and rhythm, no murmurs  PULM: Tachypneic, no accessory muscle use.  On heated high flow and nonrebreather. GI: Soft, nontender, nondistended Extremities: No peripheral edema, no clubbing or cyanosis Skin: Pallor, no rashes or wounds  Resolved Hospital Problem list     Assessment & Plan:  Acute on chronic hypoxic respiratory failure Concern  for PCP in the setting of chemotherapy with monoclonal antibody.  Legionella negative. -Chest x-ray with diffuse bilateral infiltrates appearing as a progression since his most recent chest x-ray with Covid.  Multiple possibilities for acute worsening exist-concern for progression to post Covid fibrosis, post Covid organizing pneumonia/ BOOP, post-viral bacterial or fungal pneumonia -With oral thrush and history of neutropenia, increased index of suspicion for fungal infections.  -Doubt toxicity from chemotherapy as his current chemo regimen a/w <1% incidence of pulmonary toxicity (https://www.nejm.org/doi/full/10.1056/nejmoa1708984) -There is recent data on the association of the development of PCP pneumonia with use of brentuximab as a monoclonal antibody, 0.1-1% risk  -Continue Bactrim and steroids for PJP.  Steroids decreased today -Continue cefepime.  Okay to discontinue macrolide. -Fungitell pending -Escalating bronchodilators to DuoNebs every 4 hours -Lasix maintain euvolemia -High risk for intubation.  Will perform bronchoscopy if intubated to confirm PJP diagnosis and send further cultures. -Appreciate ID's assistance -Goal net negative fluid balance- additional dose of lasix tonight   DM2 with hyperglycemia -Accu-Cheks and sliding scale insulin as needed -Goal BG 140-180 while admitted to the ICU  Chronic anemia-stable -Transfuse for hemoglobin less than 7 -Continue to monitor  Oral thrush -Continue fluconazole and magic mouth wash    History of Hodgkin's lymphoma -Currently holding chemotherapy -DVT prophylaxis with Lovenox  HLD -Continue PTA statin  Best practice:  Diet: regular Pain/Anxiety/Delirium protocol (if indicated): n/a VAP protocol (if indicated): n/a DVT prophylaxis: lovenox GI prophylaxis: protonix Glucose control: SSI Mobility: up with assist Code Status: full Family Communication: wife- surrogate decision maker Disposition: ICU  Labs    CBC: Recent Labs  Lab 07/08/19 1244 07/08/19 1244 07/08/19 1331 07/08/19 2020 07/09/19 0510 07/10/19 0605 07/11/19 0212  WBC 37.6*  --   --  33.8* 35.1* 31.6* 25.8*  NEUTROABS 33.4*  --   --   --   --   --   --   HGB 8.9*   < > 9.5* 8.2* 7.7* 7.3* 7.6*  HCT 28.6*   < > 28.0* 26.2* 24.6* 23.4* 24.4*  MCV 91.4  --   --  92.3 93.2 94.0 93.5  PLT 283  --   --  242 231 215 194   < > = values in this interval not displayed.    Basic Metabolic Panel: Recent Labs  Lab 07/08/19 1244 07/08/19 1244 07/08/19 1331 07/08/19 2020 07/09/19 0510 07/10/19 0605 07/11/19 0212  NA 143  --  140  --  141 143 139  K 3.9  --  3.7  --  3.7 3.5 3.9  CL 104  --  102  --  107 108 107  CO2 27  --   --   --  25 27 25   GLUCOSE 185*  --  177*  --  167* 146* 161*  BUN 22  --  19  --  23 27* 23  CREATININE 0.92   < > 0.80 0.82 0.82 0.81 0.76  CALCIUM 8.4*  --   --   --  8.0* 7.9* 7.8*  MG  --   --   --   --  1.7  --   --   PHOS  --   --   --   --  5.0*  --   --    < > = values in this interval not displayed.   GFR: Estimated Creatinine Clearance: 92.8 mL/min (by C-G formula based on SCr of 0.76 mg/dL). Recent Labs  Lab 07/08/19 1244 07/08/19 1244 07/08/19 1645 07/08/19 2020 07/09/19 0510 07/10/19 0605 07/11/19 0212  PROCALCITON  --   --   --  3.26 3.09 1.84  --   WBC 37.6*   < >  --  33.8* 35.1* 31.6* 25.8*  LATICACIDVEN 1.6  --  1.4  --   --   --   --    < > = values in this interval not displayed.    Liver Function Tests: Recent Labs  Lab 07/08/19 1244 07/09/19 0510  AST 25 23  ALT 12 13  ALKPHOS 85 77  BILITOT 1.1 1.0  PROT 7.3 6.3*  ALBUMIN 2.4* 2.0*   No results for input(s): LIPASE, AMYLASE in the last 168 hours. No results for input(s): AMMONIA in the last 168 hours.  ABG    Component Value Date/Time   PHART 7.473 (H) 07/08/2019 1245   PCO2ART 37.1 07/08/2019 1245   PO2ART 64.5 (L) 07/08/2019 1245   HCO3 26.8 07/08/2019 1245   TCO2 28 07/08/2019 1331   O2SAT  91.2 07/08/2019 1245     Coagulation Profile: No results for input(s): INR, PROTIME in the last 168 hours.  Cardiac Enzymes: No results for input(s): CKTOTAL, CKMB, CKMBINDEX, TROPONINI in the last 168 hours.  HbA1C: Hgb A1c MFr Bld  Date/Time Value Ref Range Status  07/09/2019 05:10 AM 7.3 (H) 4.8 - 5.6 % Final    Comment:    (NOTE) Pre diabetes:          5.7%-6.4% Diabetes:              >6.4% Glycemic control for   <7.0% adults with diabetes   05/19/2019 03:56 AM 8.3 (H) 4.8 - 5.6 % Final    Comment:    (NOTE) Pre diabetes:          5.7%-6.4% Diabetes:              >6.4% Glycemic control for   <7.0% adults with diabetes     CBG: Recent Labs  Lab 07/10/19 1715 07/10/19 2146 07/11/19 0747 07/11/19 1159 07/11/19 1726  GLUCAP 163* 203* 140* 207* 155*     This patient is critically ill with multiple organ system failure which requires frequent high complexity decision making, assessment, support, evaluation, and titration of therapies. This was completed through the application of advanced monitoring technologies and extensive interpretation of multiple databases. During this encounter critical care time was devoted to patient care services described in this note for 50 minutes.  Julian Hy, DO 07/11/19 7:37 PM [facility]

## 2019-07-12 DIAGNOSIS — C8118 Nodular sclerosis classical Hodgkin lymphoma, lymph nodes of multiple sites: Secondary | ICD-10-CM | POA: Diagnosis not present

## 2019-07-12 DIAGNOSIS — E1165 Type 2 diabetes mellitus with hyperglycemia: Secondary | ICD-10-CM | POA: Diagnosis not present

## 2019-07-12 DIAGNOSIS — B379 Candidiasis, unspecified: Secondary | ICD-10-CM | POA: Diagnosis not present

## 2019-07-12 DIAGNOSIS — R918 Other nonspecific abnormal finding of lung field: Secondary | ICD-10-CM | POA: Diagnosis not present

## 2019-07-12 DIAGNOSIS — B59 Pneumocystosis: Secondary | ICD-10-CM | POA: Diagnosis not present

## 2019-07-12 DIAGNOSIS — J9621 Acute and chronic respiratory failure with hypoxia: Secondary | ICD-10-CM | POA: Diagnosis not present

## 2019-07-12 DIAGNOSIS — C8190 Hodgkin lymphoma, unspecified, unspecified site: Secondary | ICD-10-CM | POA: Diagnosis not present

## 2019-07-12 DIAGNOSIS — J9620 Acute and chronic respiratory failure, unspecified whether with hypoxia or hypercapnia: Secondary | ICD-10-CM | POA: Diagnosis not present

## 2019-07-12 LAB — GLUCOSE, CAPILLARY
Glucose-Capillary: 123 mg/dL — ABNORMAL HIGH (ref 70–99)
Glucose-Capillary: 211 mg/dL — ABNORMAL HIGH (ref 70–99)
Glucose-Capillary: 156 mg/dL — ABNORMAL HIGH (ref 70–99)
Glucose-Capillary: 163 mg/dL — ABNORMAL HIGH (ref 70–99)

## 2019-07-12 LAB — CBC
HCT: 26.3 % — ABNORMAL LOW (ref 39.0–52.0)
Hemoglobin: 8.3 g/dL — ABNORMAL LOW (ref 13.0–17.0)
MCH: 29.3 pg (ref 26.0–34.0)
MCHC: 31.6 g/dL (ref 30.0–36.0)
MCV: 92.9 fL (ref 80.0–100.0)
Platelets: 221 10*3/uL (ref 150–400)
RBC: 2.83 MIL/uL — ABNORMAL LOW (ref 4.22–5.81)
RDW: 21.6 % — ABNORMAL HIGH (ref 11.5–15.5)
WBC: 24.1 10*3/uL — ABNORMAL HIGH (ref 4.0–10.5)
nRBC: 0.2 % (ref 0.0–0.2)

## 2019-07-12 LAB — BASIC METABOLIC PANEL
Anion gap: 10 (ref 5–15)
BUN: 20 mg/dL (ref 8–23)
CO2: 27 mmol/L (ref 22–32)
Calcium: 8.1 mg/dL — ABNORMAL LOW (ref 8.9–10.3)
Chloride: 99 mmol/L (ref 98–111)
Creatinine, Ser: 0.78 mg/dL (ref 0.61–1.24)
GFR calc Af Amer: >60 mL/min (ref >60)
GFR calc non Af Amer: >60 mL/min (ref >60)
Glucose, Bld: 217 mg/dL — ABNORMAL HIGH (ref 70–99)
Potassium: 4.1 mmol/L (ref 3.5–5.1)
Sodium: 136 mmol/L (ref 135–145)

## 2019-07-12 LAB — ASPERGILLUS ANTIBODY BY IMMUNODIFF
Aspergillus flavus: NEGATIVE
Aspergillus fumigatus, IgG: NEGATIVE
Aspergillus niger: NEGATIVE

## 2019-07-12 LAB — MAGNESIUM: Magnesium: 2 mg/dL (ref 1.7–2.4)

## 2019-07-12 LAB — FUNGITELL, SERUM: Fungitell Result: 231 pg/mL — ABNORMAL HIGH (ref <80)

## 2019-07-12 MED ORDER — FLUCONAZOLE 100MG IVPB
100.0000 mg | INTRAVENOUS | Status: DC
  Administered 2019-07-12 – 2019-07-22 (×11): 100 mg via INTRAVENOUS
  Filled 2019-07-12 (×12): qty 50

## 2019-07-12 MED ORDER — INSULIN GLARGINE 100 UNIT/ML ~~LOC~~ SOLN
5.0000 [IU] | Freq: Two times a day (BID) | SUBCUTANEOUS | Status: DC
  Administered 2019-07-12 – 2019-07-13 (×3): 5 [IU] via SUBCUTANEOUS
  Filled 2019-07-12 (×4): qty 0.05

## 2019-07-12 MED ORDER — FUROSEMIDE 10 MG/ML IJ SOLN
40.0000 mg | Freq: Once | INTRAMUSCULAR | Status: AC
  Administered 2019-07-12: 40 mg via INTRAVENOUS
  Filled 2019-07-12: qty 4

## 2019-07-12 NOTE — Progress Notes (Signed)
NAME:  Steve [name], MRN:  [id number], DOB:  [birth year], LOS: 4 ADMISSION DATE:  07/08/2019, CONSULTATION DATE:  3/29 REFERRING MD:  [name], CHIEF COMPLAINT:  hypoxia   Brief History   Hodgkin's lymphoma Covid in Feb 2021, chronic hypoxic respiratory failure on 2L HOT since Acute worsening of respiratory failure  History of present illness   Steve [name] is a 64 y/o gentleman with a history of Hodgkin's lymphoma (stage III+, diagnosed 02/2019) on chemotherapy with AVD + Adcetris (brentuximab vedotin, doxorubicin, vinblastine, dacarbazine). He follows with Dr. Maylon Peppers in Oncology. His most recent treatment was about 3 weeks ago (06/17/19). Due to worsening respiratory status and inability to tolerate PO intake recently, he had his chemo delayed this week.  He has a history of neutropenic fever in the past. He receives G-CSF post-chemo. He had a good response to chemo on his first follow up PET scan.   His appetite has been poor and he has been receiving IV fluids. For the past few weeks his dyspnea has been worsening, and today he had saturations in the 50s at home on his supplemental oxygen, which he has been increasing recently on his home concentrator. He endorses sore throat and having white membrane in his throat. No cough, sputum production, edema, CP, abdominal distention, n/v/d. No change over port site or tract. No family history of lung disease.  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 Left ventricular ejection fraction, by estimation, is 60 to 65%. The  left ventricle has normal function. The left ventricle has no regional  wall motion abnormalities. Left ventricular diastolic parameters are  consistent with Grade  I diastolic  dysfunction (impaired relaxation).  2. Right ventricular systolic function is normal. The right ventricular  size is normal.  3. Left atrial size was mildly dilated.  4. The mitral valve is grossly normal. Trivial mitral valve  regurgitation.  5. The aortic valve is tricuspid. Aortic valve regurgitation is not  visualized.  6. The inferior vena cava is normal in size with greater than 50%  respiratory variability, suggesting right atrial pressure of 3 mmHg.  7. Cannot exclude small PFO.   Micro Data:  3/29 blood cultures 3/29 urine>> NG, final Crypto Ag negative Strep pneumo Ag negative History Ag urine negative Legionella negative  Antimicrobials:  Cefepime 3/29>> vanc 3/29>> Azithromycin 3/29>>4/1 Fluconazole 3/29>>  Interim history/subjective:  More short of breath this morning, improved after Lasix  Objective   Blood pressure 135/86, pulse 92, temperature 98.2 F (36.8 C), temperature source Axillary, resp. rate (!) 32, height 5\' 6"  (1.676 m), weight 77.2 kg, SpO2 90 %.    FiO2 (%):  [70 %-100 %] 100 %   Intake/Output Summary (Last 24 hours) at 07/12/2019 0948 Last data filed at 07/12/2019 0900 Gross per 24 hour  Intake 2169.86 ml  Output 4850 ml  Net -2680.14 ml   Filed Weights   07/09/19 0500 07/11/19 0500 07/12/19 0500  Weight: 74.4 kg 77.7 kg 77.2 kg    Examination: General: Critically ill-appearing man lying in bed watching television HEENT: [place]/AT, eyes anicteric.  Dry cracked lips. Neuro: Alert and oriented, answering questions appropriately.  Globally weak. CV: Minimally tachycardic, regular rhythm.  No murmur PULM: Tachypneic, no  accessory muscle use.  On heated high flow and nonrebreather.  Rales bilaterally.  Exam unchanged from previous. GI: Soft, nontender, nondistended Extremities: No peripheral edema, no clubbing or cyanosis Skin: Pallor, no rashes or wounds.  Resolved Hospital Problem list     Assessment & Plan:  Acute on  chronic hypoxic respiratory failure Concern for PCP in the setting of chemotherapy with monoclonal antibody.  Legionella negative. Doubt toxicity from chemotherapy as his current chemo regimen a/w <1% incidence of pulmonary toxicity (https://www.nejm.org/doi/full/10.1056/nejmoa1708984). 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 -Continue cefepime to complete a 7-day course -Continue duo nebs every 4 hours -Lasix to maintain euvolemia -Very high risk for intubation.  Continue to monitor in ICU.  We will plan to bronch for cultures and PGP smear if he gets intubated. -Appreciate infectious disease team's assistance -Fungitell pending  DM2 with hyperglycemia -Adding 5 units Lantus twice daily -Continue Accu-Cheks with sliding scale insulin as needed -Goal blood glucose 140-180 while admitted to the ICU   Chronic anemia-stable -Transfuse for hemoglobin less than 7 -Continue to monitor  Oral thrush -Continue fluconazole and magic mouthwash PRN  History of Hodgkin's lymphoma -Currently holding chemotherapy -DVT prophylaxis with Lovenox  HLD -Continue PTA statin  Best practice:  Diet: NPO Pain/Anxiety/Delirium protocol (if indicated): n/a VAP protocol (if indicated): n/a DVT prophylaxis: lovenox GI prophylaxis: protonix Glucose control: SSI Mobility: up with assist Code Status: full Family Communication: wife- surrogate decision maker Disposition: ICU  Labs   CBC: Recent Labs  Lab 07/08/19 1244 07/08/19 1331 07/08/19 2020 07/09/19 0510 07/10/19 0605 07/11/19 0212 07/12/19 0408  WBC 37.6*   < > 33.8* 35.1* 31.6* 25.8* 24.1*  NEUTROABS 33.4*  --   --   --   --   --   --   HGB 8.9*   < > 8.2* 7.7* 7.3* 7.6* 8.3*  HCT 28.6*   < > 26.2* 24.6* 23.4* 24.4* 26.3*  MCV 91.4   < > 92.3 93.2 94.0 93.5 92.9  PLT 283   < >  242 231 215 194 221   < > = values in this interval not displayed.    Basic Metabolic Panel: Recent Labs  Lab 07/08/19 1244 07/08/19 1244 07/08/19 1331 07/08/19 1331 07/08/19 2020 07/09/19 0510 07/10/19 0605 07/11/19 0212 07/12/19 0408  NA 143   < > 140  --   --  141 143 139 136  K 3.9   < > 3.7  --   --  3.7 3.5 3.9 4.1  CL 104   < > 102  --   --  107 108 107 99  CO2 27  --   --   --   --  25 27 25 27   GLUCOSE 185*   < > 177*  --   --  167* 146* 161* 217*  BUN 22   < > 19  --   --  23 27* 23 20  CREATININE 0.92   < > 0.80   < > 0.82 0.82 0.81 0.76 0.78  CALCIUM 8.4*  --   --   --   --  8.0* 7.9* 7.8* 8.1*  MG  --   --   --   --   --  1.7  --   --  2.0  PHOS  --   --   --   --   --  5.0*  --   --   --    < > = values in this interval not displayed.   GFR: Estimated Creatinine Clearance: 92.5 mL/min (by C-G formula based on SCr of 0.78 mg/dL). Recent Labs  Lab 07/08/19 1244 07/08/19 1244 07/08/19 1645 07/08/19 2020 07/08/19 2020 07/09/19 0510 07/10/19 0605 07/11/19 0212 07/12/19 0408  PROCALCITON  --   --   --  3.26  --  3.09 1.84  --   --   WBC 37.6*   < >  --  33.8*   < > 35.1* 31.6* 25.8* 24.1*  LATICACIDVEN 1.6  --  1.4  --   --   --   --   --   --    < > = values in this interval not displayed.    Liver Function Tests: Recent Labs  Lab 07/08/19 1244 07/09/19 0510  AST 25 23  ALT 12 13  ALKPHOS 85 77  BILITOT 1.1 1.0  PROT 7.3 6.3*  ALBUMIN 2.4* 2.0*   No results for input(s): LIPASE, AMYLASE in the last 168 hours. No results for input(s): AMMONIA in the last 168 hours.  ABG    Component Value Date/Time   PHART 7.473 (H) 07/08/2019 1245   PCO2ART 37.1 07/08/2019 1245   PO2ART 64.5 (L) 07/08/2019 1245   HCO3 26.8 07/08/2019 1245   TCO2 28 07/08/2019 1331   O2SAT 91.2 07/08/2019 1245     Coagulation Profile: No results for input(s): INR, PROTIME in the last 168 hours.  Cardiac Enzymes: No results for input(s): CKTOTAL, CKMB, CKMBINDEX,  TROPONINI in the last 168 hours.  HbA1C: Hgb A1c MFr Bld  Date/Time Value Ref Range Status  07/09/2019 05:10 AM 7.3 (H) 4.8 - 5.6 % Final    Comment:    (NOTE) Pre diabetes:          5.7%-6.4% Diabetes:              >6.4% Glycemic control for   <7.0% adults with diabetes   05/19/2019 03:56 AM 8.3 (H) 4.8 - 5.6 % Final    Comment:    (NOTE) Pre diabetes:          5.7%-6.4% Diabetes:              >6.4% Glycemic control for   <7.0% adults with diabetes     CBG: Recent Labs  Lab 07/11/19 0747 07/11/19 1159 07/11/19 1726 07/11/19 2102 07/12/19 0832  GLUCAP 140* 207* 155* 223* 123*     This patient is critically ill with multiple organ system failure which requires frequent high complexity decision making, assessment, support, evaluation, and titration of therapies. This was completed through the application of advanced monitoring technologies and extensive interpretation of multiple databases. During this encounter critical care time was devoted to patient care services described in this note for 35 minutes.  Julian Hy, DO 07/12/19 9:59 AM [facility]

## 2019-07-12 NOTE — Progress Notes (Addendum)
Subjective:  He appears more calm today when I examined him.   Antibiotics:  Anti-infectives (From admission, onward)   Start     Dose/Rate Route Frequency Ordered Stop   07/12/19 1000  fluconazole (DIFLUCAN) IVPB 100 mg     100 mg 50 mL/hr over 60 Minutes Intravenous Every 24 hours 07/12/19 0929     07/10/19 1800  meropenem (MERREM) 1 g in sodium chloride 0.9 % 100 mL IVPB     1 g 200 mL/hr over 30 Minutes Intravenous Every 8 hours 07/10/19 1425     07/10/19 0900  sulfamethoxazole-trimethoprim (BACTRIM) 450 mg in dextrose 5 % 500 mL IVPB     450 mg 352.1 mL/hr over 90 Minutes Intravenous Every 8 hours 07/10/19 0813     07/09/19 1000  fluconazole (DIFLUCAN) tablet 100 mg  Status:  Discontinued     100 mg Oral Daily 07/08/19 1919 07/12/19 0929   07/09/19 0200  vancomycin (VANCOCIN) IVPB 1000 mg/200 mL premix  Status:  Discontinued     1,000 mg 200 mL/hr over 60 Minutes Intravenous Every 12 hours 07/08/19 1930 07/10/19 1017   07/08/19 2200  ceFEPIme (MAXIPIME) 2 g in sodium chloride 0.9 % 100 mL IVPB  Status:  Discontinued     2 g 200 mL/hr over 30 Minutes Intravenous Every 8 hours 07/08/19 1929 07/10/19 1425   07/08/19 2000  fluconazole (DIFLUCAN) tablet 200 mg     200 mg Oral  Once 07/08/19 1919 07/08/19 2005   07/08/19 1800  azithromycin (ZITHROMAX) 500 mg in sodium chloride 0.9 % 250 mL IVPB  Status:  Discontinued     500 mg 250 mL/hr over 60 Minutes Intravenous Every 24 hours 07/08/19 1736 07/12/19 0708   07/08/19 1345  ceFEPIme (MAXIPIME) 2 g in sodium chloride 0.9 % 100 mL IVPB     2 g 200 mL/hr over 30 Minutes Intravenous STAT 07/08/19 1343 07/08/19 1631   07/08/19 1345  vancomycin (VANCOREADY) IVPB 1500 mg/300 mL     1,500 mg 150 mL/hr over 120 Minutes Intravenous STAT 07/08/19 1343 07/08/19 1631      Medications: Scheduled Meds: . atorvastatin  10 mg Oral Daily  . Chlorhexidine Gluconate Cloth  6 each Topical Daily  . enoxaparin (LOVENOX) injection  40  mg Subcutaneous Q24H  . feeding supplement (ENSURE ENLIVE)  237 mL Oral BID BM  . feeding supplement (PRO-STAT SUGAR FREE 64)  30 mL Oral BID  . ferrous sulfate  325 mg Oral Q breakfast  . insulin aspart  0-9 Units Subcutaneous TID WC  . insulin aspart  5 Units Subcutaneous TID WC  . insulin glargine  5 Units Subcutaneous BID  . ipratropium-albuterol  3 mL Nebulization Q4H  . mouth rinse  15 mL Mouth Rinse BID  . methylPREDNISolone (SOLU-MEDROL) injection  60 mg Intravenous BID  . multivitamin with minerals  2 tablet Oral Daily  . pantoprazole (PROTONIX) IV  40 mg Intravenous QHS  . sodium chloride flush  10-40 mL Intracatheter Q12H   Continuous Infusions: . fluconazole (DIFLUCAN) IV Stopped (07/12/19 1138)  . meropenem (MERREM) IV Stopped (07/12/19 1028)  . sulfamethoxazole-trimethoprim Stopped (07/12/19 1125)   PRN Meds:.acetaminophen, acetaminophen, lip balm, magic mouthwash w/lidocaine, melatonin, ondansetron (ZOFRAN) IV, sodium chloride    Objective: Weight change: -0.5 kg  Intake/Output Summary (Last 24 hours) at 07/12/2019 1224 Last data filed at 07/12/2019 1200 Gross per 24 hour  Intake 2770.1 ml  Output 3175 ml  Net -404.9  ml   Blood pressure 130/76, pulse 81, temperature 98.2 F (36.8 C), temperature source Axillary, resp. rate (!) 41, height _0  (1.676 m), weight 77.2 kg, SpO2 93 %. Temp:  [96.4 F (35.8 C)-98.2 F (36.8 C)] 98.2 F (36.8 C) (04/02 0835) Pulse Rate:  [73-97] 81 (04/02 1100) Resp:  [20-41] 41 (04/02 1100) BP: (111-157)/(64-89) 130/76 (04/02 1100) SpO2:  [83 %-97 %] 93 % (04/02 1132) FiO2 (%):  [70 %-100 %] 100 % (04/02 1132) Weight:  [77.2 kg] 77.2 kg (04/02 0500)  Physical Exam: General: Alert and awake, oriented x3,  HEENT: anicteric sclera, EOMI CVS tachycardic Chest: , Tachypneic but less uncomfortable appearing Abdomen: soft non-distended,  Extremities: no edema or deformity noted bilaterally Skin: no rashes Neuro:  nonfocal  CBC:    BMET Recent Labs    07/11/19 0212 07/12/19 0408  NA 139 136  K 3.9 4.1  CL 107 99  CO2 25 27  GLUCOSE 161* 217*  BUN 23 20  CREATININE 0.76 0.78  CALCIUM 7.8* 8.1*     Liver Panel  No results for input(s): PROT, ALBUMIN, AST, ALT, ALKPHOS, BILITOT, BILIDIR, IBILI in the last 72 hours.     Sedimentation Rate No results for input(s): ESRSEDRATE in the last 72 hours. C-Reactive Protein No results for input(s): CRP in the last 72 hours.  Micro Results: Recent Results (from the past 720 hour(s))  Blood Culture (routine x 2)     Status: None (Preliminary result)   Collection Time: 07/08/19  1:20 PM   Specimen: BLOOD  Result Value Ref Range Status   Specimen Description   Final    BLOOD RIGHT ANTECUBITAL Performed at [facility] [address], [place], [place] [address]    Special Requests   Final    BOTTLES DRAWN AEROBIC AND ANAEROBIC Blood Culture adequate volume Performed at [facility] [address] [place], [place] [address]    Culture   Final    NO GROWTH 3 DAYS Performed at [facility], [place] [address] [place], [place] [address]    Report Status PENDING  Incomplete  Blood Culture (routine x 2)     Status: None (Preliminary result)   Collection Time: 07/08/19  1:25 PM   Specimen: BLOOD  Result Value Ref Range Status   Specimen Description   Final    BLOOD RIGHT ANTECUBITAL Performed at [facility] [address], [place], [place] [address]    Special Requests   Final    BOTTLES DRAWN AEROBIC AND ANAEROBIC Blood Culture adequate volume Performed at [facility] [address], [place], [place] [address]    Culture   Final    NO GROWTH 3 DAYS Performed at [facility], [place] [address], [place], [place] [address]    Report Status PENDING  Incomplete  Urine culture     Status: None   Collection Time: 07/08/19  6:30 PM   Specimen: In/Out  Cath Urine  Result Value Ref Range Status   Specimen Description   Final    IN/OUT CATH URINE Performed at [facility] [address], [place], [place] [address]    Special Requests   Final    NONE Performed at [facility], [place] [address], [place], [place] [address]    Culture   Final    NO GROWTH Performed at [facility], [place] [address] [place], [place] [address]  Report Status 07/09/2019 FINAL  Final  MRSA PCR Screening     Status: None   Collection Time: 07/08/19  9:29 PM   Specimen: Nasopharyngeal  Result Value Ref Range Status   MRSA by PCR NEGATIVE NEGATIVE Final    Comment:        The GeneXpert MRSA Assay (FDA approved for NASAL specimens only), is one component of a comprehensive MRSA colonization surveillance program. It is not intended to diagnose MRSA infection nor to guide or monitor treatment for MRSA infections. Performed at [facility], [place] [address], [place], [place] [address]     Studies/Results: DG CHEST PORT 1 VIEW  Result Date: 07/11/2019 CLINICAL DATA:  Continued hypoxia post COVID infection. EXAM: PORTABLE CHEST 1 VIEW COMPARISON:  CTA chest and chest x-ray dated July 08, 2019. FINDINGS: Unchanged left chest wall port catheter. Stable mild cardiomegaly. Diffuse bilateral interstitial and hazy airspace opacities throughout both lungs appear relatively similar to the prior study. No pleural effusion or pneumothorax. No acute osseous abnormality. IMPRESSION: Relatively unchanged extensive bilateral airspace disease. Electronically Signed   By: Titus Dubin M.D.   On: 07/11/2019 09:10      Assessment/Plan:  INTERVAL HISTORY: LDH was elevated and Bactrim and corticosteroid therapy was started   Active Problems:   Hodgkin's lymphoma ([name])   Malnutrition of moderate degree   HCAP (healthcare-associated pneumonia)   Hypoxia   Acute on chronic respiratory failure ([name])    Diabetes mellitus ([name])    Steve [name] is a 64 y.o. male with Hodgkin's lymphoma on chemotherapy, with Covid pneumonia in February and supplemental oxygen requirements afterwards who presents with acute on chronic respiratory failure with bilateral diffuse pulmonary infiltrates.  Serum crypto ag, urine histo ag negative, legionella ag negative Fungitell is 231  1..Bilateral diffuse pulmonary infiltrates: Agree with continuing therapy with Bactrim and steroids., merrem   Clinically his picture fits including Fungitell for PCP and less likely with an invasive mold  I would not pull trigger on voriconazole because my suspicion for latter is lower   He very well require BAL for [place] to get clarity on whether this is pCP or another OI   2 Thrush: on fluconazole  Dr. Megan Salon is available for questions this weekend and I will be back on Monday afternoon.   LOS: 4 days   Alcide Evener 07/12/2019, 12:24 PM

## 2019-07-13 DIAGNOSIS — R093 Abnormal sputum: Secondary | ICD-10-CM

## 2019-07-13 DIAGNOSIS — R0602 Shortness of breath: Secondary | ICD-10-CM

## 2019-07-13 DIAGNOSIS — R05 Cough: Secondary | ICD-10-CM | POA: Diagnosis not present

## 2019-07-13 DIAGNOSIS — E1165 Type 2 diabetes mellitus with hyperglycemia: Secondary | ICD-10-CM | POA: Diagnosis not present

## 2019-07-13 DIAGNOSIS — E875 Hyperkalemia: Secondary | ICD-10-CM

## 2019-07-13 DIAGNOSIS — R5381 Other malaise: Secondary | ICD-10-CM

## 2019-07-13 DIAGNOSIS — Z95828 Presence of other vascular implants and grafts: Secondary | ICD-10-CM

## 2019-07-13 DIAGNOSIS — J9621 Acute and chronic respiratory failure with hypoxia: Secondary | ICD-10-CM | POA: Diagnosis not present

## 2019-07-13 DIAGNOSIS — C8118 Nodular sclerosis classical Hodgkin lymphoma, lymph nodes of multiple sites: Secondary | ICD-10-CM | POA: Diagnosis not present

## 2019-07-13 DIAGNOSIS — R5383 Other fatigue: Secondary | ICD-10-CM

## 2019-07-13 LAB — BASIC METABOLIC PANEL
Anion gap: 8 (ref 5–15)
BUN: 27 mg/dL — ABNORMAL HIGH (ref 8–23)
CO2: 28 mmol/L (ref 22–32)
Calcium: 8.2 mg/dL — ABNORMAL LOW (ref 8.9–10.3)
Chloride: 97 mmol/L — ABNORMAL LOW (ref 98–111)
Creatinine, Ser: 0.82 mg/dL (ref 0.61–1.24)
GFR calc Af Amer: >60 mL/min (ref >60)
GFR calc non Af Amer: >60 mL/min (ref >60)
Glucose, Bld: 246 mg/dL — ABNORMAL HIGH (ref 70–99)
Potassium: 5.1 mmol/L (ref 3.5–5.1)
Sodium: 133 mmol/L — ABNORMAL LOW (ref 135–145)

## 2019-07-13 LAB — CULTURE, BLOOD (ROUTINE X 2)
Culture: NO GROWTH
Culture: NO GROWTH
Special Requests: ADEQUATE
Special Requests: ADEQUATE

## 2019-07-13 LAB — CBC
HCT: 26.3 % — ABNORMAL LOW (ref 39.0–52.0)
Hemoglobin: 8.2 g/dL — ABNORMAL LOW (ref 13.0–17.0)
MCH: 29 pg (ref 26.0–34.0)
MCHC: 31.2 g/dL (ref 30.0–36.0)
MCV: 92.9 fL (ref 80.0–100.0)
Platelets: 248 10*3/uL (ref 150–400)
RBC: 2.83 MIL/uL — ABNORMAL LOW (ref 4.22–5.81)
RDW: 21.9 % — ABNORMAL HIGH (ref 11.5–15.5)
WBC: 21.3 10*3/uL — ABNORMAL HIGH (ref 4.0–10.5)
nRBC: 0.1 % (ref 0.0–0.2)

## 2019-07-13 LAB — GLUCOSE, CAPILLARY
Glucose-Capillary: 106 mg/dL — ABNORMAL HIGH (ref 70–99)
Glucose-Capillary: 202 mg/dL — ABNORMAL HIGH (ref 70–99)
Glucose-Capillary: 118 mg/dL — ABNORMAL HIGH (ref 70–99)
Glucose-Capillary: 216 mg/dL — ABNORMAL HIGH (ref 70–99)

## 2019-07-13 MED ORDER — FUROSEMIDE 10 MG/ML IJ SOLN
40.0000 mg | Freq: Once | INTRAMUSCULAR | Status: AC
  Administered 2019-07-13: 13:00:00 40 mg via INTRAVENOUS
  Filled 2019-07-13: qty 4

## 2019-07-13 MED ORDER — INSULIN GLARGINE 100 UNIT/ML ~~LOC~~ SOLN
8.0000 [IU] | Freq: Two times a day (BID) | SUBCUTANEOUS | Status: DC
  Administered 2019-07-13 – 2019-07-29 (×33): 8 [IU] via SUBCUTANEOUS
  Filled 2019-07-13 (×34): qty 0.08

## 2019-07-13 MED ORDER — SODIUM ZIRCONIUM CYCLOSILICATE 5 G PO PACK
5.0000 g | PACK | Freq: Once | ORAL | Status: AC
  Administered 2019-07-13: 09:00:00 5 g via ORAL
  Filled 2019-07-13: qty 1

## 2019-07-13 MED ORDER — INSULIN ASPART 100 UNIT/ML ~~LOC~~ SOLN
3.0000 [IU] | SUBCUTANEOUS | Status: DC
  Administered 2019-07-13 (×2): 9 [IU] via SUBCUTANEOUS
  Administered 2019-07-14 (×2): 6 [IU] via SUBCUTANEOUS
  Administered 2019-07-14: 9 [IU] via SUBCUTANEOUS
  Administered 2019-07-14: 6 [IU] via SUBCUTANEOUS
  Administered 2019-07-15 (×2): 3 [IU] via SUBCUTANEOUS
  Administered 2019-07-15: 6 [IU] via SUBCUTANEOUS
  Administered 2019-07-15: 3 [IU] via SUBCUTANEOUS
  Administered 2019-07-15: 9 [IU] via SUBCUTANEOUS
  Administered 2019-07-16: 3 [IU] via SUBCUTANEOUS
  Administered 2019-07-16 (×2): 6 [IU] via SUBCUTANEOUS
  Administered 2019-07-16: 9 [IU] via SUBCUTANEOUS
  Administered 2019-07-17 (×2): 6 [IU] via SUBCUTANEOUS
  Administered 2019-07-17 – 2019-07-18 (×5): 3 [IU] via SUBCUTANEOUS
  Administered 2019-07-18: 9 [IU] via SUBCUTANEOUS
  Administered 2019-07-19: 6 [IU] via SUBCUTANEOUS
  Administered 2019-07-19: 3 [IU] via SUBCUTANEOUS
  Administered 2019-07-19 – 2019-07-20 (×4): 6 [IU] via SUBCUTANEOUS
  Administered 2019-07-20 – 2019-07-21 (×4): 3 [IU] via SUBCUTANEOUS
  Administered 2019-07-21 – 2019-07-22 (×4): 6 [IU] via SUBCUTANEOUS
  Administered 2019-07-22: 3 [IU] via SUBCUTANEOUS
  Administered 2019-07-22: 6 [IU] via SUBCUTANEOUS
  Administered 2019-07-22 – 2019-07-23 (×6): 3 [IU] via SUBCUTANEOUS
  Administered 2019-07-23: 18:00:00 6 [IU] via SUBCUTANEOUS
  Administered 2019-07-23: 3 [IU] via SUBCUTANEOUS
  Administered 2019-07-24 (×2): 6 [IU] via SUBCUTANEOUS
  Administered 2019-07-24: 3 [IU] via SUBCUTANEOUS
  Administered 2019-07-25: 6 [IU] via SUBCUTANEOUS
  Administered 2019-07-25 (×2): 3 [IU] via SUBCUTANEOUS
  Administered 2019-07-25: 6 [IU] via SUBCUTANEOUS
  Administered 2019-07-25 (×2): 3 [IU] via SUBCUTANEOUS
  Administered 2019-07-26 – 2019-07-27 (×5): 6 [IU] via SUBCUTANEOUS
  Administered 2019-07-27 (×2): 3 [IU] via SUBCUTANEOUS
  Administered 2019-07-28 (×4): 6 [IU] via SUBCUTANEOUS
  Administered 2019-07-29: 3 [IU] via SUBCUTANEOUS
  Administered 2019-07-29 – 2019-07-30 (×5): 6 [IU] via SUBCUTANEOUS
  Administered 2019-07-30: 8 [IU] via SUBCUTANEOUS
  Administered 2019-07-31: 6 [IU] via SUBCUTANEOUS
  Administered 2019-07-31: 9 [IU] via SUBCUTANEOUS
  Administered 2019-07-31: 3 [IU] via SUBCUTANEOUS
  Administered 2019-07-31: 17:00:00 6 [IU] via SUBCUTANEOUS
  Administered 2019-08-01 (×2): 3 [IU] via SUBCUTANEOUS
  Administered 2019-08-01 (×2): 6 [IU] via SUBCUTANEOUS
  Administered 2019-08-01: 3 [IU] via SUBCUTANEOUS
  Administered 2019-08-01 – 2019-08-02 (×2): 6 [IU] via SUBCUTANEOUS
  Administered 2019-08-02 (×2): 3 [IU] via SUBCUTANEOUS
  Administered 2019-08-02: 6 [IU] via SUBCUTANEOUS
  Administered 2019-08-02 – 2019-08-03 (×2): 3 [IU] via SUBCUTANEOUS
  Administered 2019-08-03: 6 [IU] via SUBCUTANEOUS
  Administered 2019-08-03 (×2): 3 [IU] via SUBCUTANEOUS
  Administered 2019-08-03 – 2019-08-04 (×3): 6 [IU] via SUBCUTANEOUS
  Administered 2019-08-04 (×2): 3 [IU] via SUBCUTANEOUS
  Administered 2019-08-04 – 2019-08-05 (×2): 6 [IU] via SUBCUTANEOUS
  Administered 2019-08-05: 20:00:00 3 [IU] via SUBCUTANEOUS
  Administered 2019-08-05: 16:00:00 6 [IU] via SUBCUTANEOUS
  Administered 2019-08-05 – 2019-08-06 (×2): 3 [IU] via SUBCUTANEOUS
  Administered 2019-08-06 (×2): 6 [IU] via SUBCUTANEOUS
  Administered 2019-08-06 – 2019-08-07 (×6): 3 [IU] via SUBCUTANEOUS
  Administered 2019-08-07: 6 [IU] via SUBCUTANEOUS
  Administered 2019-08-08 (×4): 3 [IU] via SUBCUTANEOUS
  Administered 2019-08-09 (×2): 6 [IU] via SUBCUTANEOUS
  Administered 2019-08-09: 3 [IU] via SUBCUTANEOUS
  Administered 2019-08-09: 6 [IU] via SUBCUTANEOUS
  Administered 2019-08-10: 9 [IU] via SUBCUTANEOUS
  Administered 2019-08-10 (×2): 6 [IU] via SUBCUTANEOUS
  Administered 2019-08-10 – 2019-08-11 (×4): 3 [IU] via SUBCUTANEOUS
  Administered 2019-08-11: 6 [IU] via SUBCUTANEOUS
  Administered 2019-08-11: 3 [IU] via SUBCUTANEOUS
  Administered 2019-08-11: 6 [IU] via SUBCUTANEOUS
  Administered 2019-08-12 (×2): 3 [IU] via SUBCUTANEOUS
  Administered 2019-08-12: 6 [IU] via SUBCUTANEOUS
  Administered 2019-08-12: 03:00:00 3 [IU] via SUBCUTANEOUS
  Administered 2019-08-13: 6 [IU] via SUBCUTANEOUS
  Administered 2019-08-13: 3 [IU] via SUBCUTANEOUS
  Administered 2019-08-13: 6 [IU] via SUBCUTANEOUS
  Administered 2019-08-13: 3 [IU] via SUBCUTANEOUS
  Administered 2019-08-13: 6 [IU] via SUBCUTANEOUS
  Administered 2019-08-14: 3 [IU] via SUBCUTANEOUS
  Administered 2019-08-14 (×3): 6 [IU] via SUBCUTANEOUS
  Administered 2019-08-14 (×2): 3 [IU] via SUBCUTANEOUS
  Administered 2019-08-14: 9 [IU] via SUBCUTANEOUS
  Administered 2019-08-15 (×2): 6 [IU] via SUBCUTANEOUS
  Administered 2019-08-15: 20:00:00 9 [IU] via SUBCUTANEOUS
  Administered 2019-08-15 – 2019-08-16 (×3): 6 [IU] via SUBCUTANEOUS
  Administered 2019-08-16 (×2): 3 [IU] via SUBCUTANEOUS
  Administered 2019-08-16 (×3): 6 [IU] via SUBCUTANEOUS
  Administered 2019-08-17: 13:00:00 9 [IU] via SUBCUTANEOUS
  Administered 2019-08-17 (×2): 6 [IU] via SUBCUTANEOUS
  Administered 2019-08-17 (×2): 3 [IU] via SUBCUTANEOUS
  Administered 2019-08-17 – 2019-08-18 (×3): 6 [IU] via SUBCUTANEOUS
  Administered 2019-08-18: 3 [IU] via SUBCUTANEOUS
  Administered 2019-08-18 (×2): 6 [IU] via SUBCUTANEOUS
  Administered 2019-08-18: 9 [IU] via SUBCUTANEOUS
  Administered 2019-08-19 – 2019-08-20 (×3): 3 [IU] via SUBCUTANEOUS
  Administered 2019-08-20: 6 [IU] via SUBCUTANEOUS
  Administered 2019-08-21 (×2): 3 [IU] via SUBCUTANEOUS
  Administered 2019-08-21: 6 [IU] via SUBCUTANEOUS
  Administered 2019-08-22: 9 [IU] via SUBCUTANEOUS
  Administered 2019-08-22: 3 [IU] via SUBCUTANEOUS
  Administered 2019-08-22: 6 [IU] via SUBCUTANEOUS
  Administered 2019-08-22 – 2019-08-24 (×6): 3 [IU] via SUBCUTANEOUS
  Administered 2019-08-24: 6 [IU] via SUBCUTANEOUS
  Administered 2019-08-25 (×3): 3 [IU] via SUBCUTANEOUS
  Administered 2019-08-25: 6 [IU] via SUBCUTANEOUS
  Administered 2019-08-26: 3 [IU] via SUBCUTANEOUS
  Administered 2019-08-26: 6 [IU] via SUBCUTANEOUS
  Administered 2019-08-26 – 2019-08-27 (×4): 3 [IU] via SUBCUTANEOUS
  Administered 2019-08-27 – 2019-08-28 (×4): 6 [IU] via SUBCUTANEOUS

## 2019-07-13 MED ORDER — SODIUM ZIRCONIUM CYCLOSILICATE 5 G PO PACK
5.0000 g | PACK | Freq: Once | ORAL | Status: AC
  Administered 2019-07-13: 5 g via ORAL
  Filled 2019-07-13: qty 1

## 2019-07-13 MED ORDER — SODIUM ZIRCONIUM CYCLOSILICATE 10 G PO PACK
10.0000 g | PACK | Freq: Every day | ORAL | Status: DC
  Administered 2019-07-15: 10:00:00 10 g via ORAL
  Filled 2019-07-13 (×3): qty 1

## 2019-07-13 NOTE — Progress Notes (Signed)
NAME:  Steve [name], MRN:  [id number], DOB:  February 05, 1956, LOS: 5 ADMISSION DATE:  07/08/2019, CONSULTATION DATE:  3/29 REFERRING MD:  [name], CHIEF COMPLAINT:  hypoxia   Brief History   Hodgkin's lymphoma Covid in Feb 2021, chronic hypoxic respiratory failure on 2L HOT since Acute worsening of respiratory failure  History of present illness   Steve [name] is a 64 y/o gentleman with a history of Hodgkin's lymphoma (stage III+, diagnosed 02/2019) on chemotherapy with AVD + Adcetris (brentuximab vedotin, doxorubicin, vinblastine, dacarbazine). He follows with Dr. Maylon Peppers in Oncology. His most recent treatment was about 3 weeks ago (06/17/19). Due to worsening respiratory status and inability to tolerate PO intake recently, he had his chemo delayed this week.  He has a history of neutropenic fever in the past. He receives G-CSF post-chemo. He had a good response to chemo on his first follow up PET scan.   His appetite has been poor and he has been receiving IV fluids. For the past few weeks his dyspnea has been worsening, and today he had saturations in the 50s at home on his supplemental oxygen, which he has been increasing recently on his home concentrator. He endorses sore throat and having white membrane in his throat. No cough, sputum production, edema, CP, abdominal distention, n/v/d. No change over port site or tract. No family history of lung disease.  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  Mildly dilated LA, normal RV.  Trivial MR and TR, otherwise normal valves.  Micro Data:  3/29 blood cultures 3/29 urine>> NG, final Crypto Ag  negative Strep pneumo Ag negative History Ag urine negative Legionella negative  Antimicrobials:  Cefepime 3/29>> 3/31 vanc 3/29>> 3/30 Azithromycin 3/29>>4/1 Fluconazole 3/29>> Meropenem 3/31>> Bactrim 3/31>>  Interim history/subjective:  Slept well overnight. No rashes, n/v/d. Breathing is feeling improved.  Objective   Blood pressure 125/70, pulse 80, temperature (!) 96.6 F (35.9 C), temperature source Axillary, resp. rate (!) 31, height 5\' 6"  (1.676 m), weight 68.8 kg, SpO2 94 %.    FiO2 (%):  [80 %-100 %] 90 %   Intake/Output Summary (Last 24 hours) at 07/13/2019 0847 Last data filed at 07/13/2019 0500 Gross per 24 hour  Intake 1581.09 ml  Output 2875 ml  Net -1293.91 ml   Filed Weights   07/11/19 0500 07/12/19 0500 07/13/19 0500  Weight: 77.7 kg 72.7 kg 68.8 kg    Examination: General: Critically ill-appearing man lying in bed sleeping, arouses easily to stimulation. HEENT: [place]/AT, eyes anicteric. Neuro: Alert and oriented, answering questions appropriately.  Globally weak. CV: Regular rate and rhythm, gets tachycardic with exertion.  No murmurs.   PULM: Tachypneic, no accessory muscle use.  Able to speak in mildly truncated sentences.  With desaturation he recovers slowly.  Saturations in the high 90s when resting comfortably on NRB & HHFNC.  Faint rales bilaterally. GI: Soft, nontender, nondistended Extremities: No peripheral edema or cyanosis Skin: No rashes or wounds.  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 Elevated serum LDH & fungitell  support this diagnosis.  Legionella negative. He had Covid in February 2021, discharged on 2 L home oxygen.  Had typical Covid related fibrotic changes on CT scan in early March with progression to diffuse groundglass. Doubt toxicity from chemotherapy as his current chemo regimen a/w <1% incidence  of pulmonary toxicity (https://www.nejm.org/doi/full/10.1056/nejmoa1708984). 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 -PJP smear and routine culture from expectorated sputum pending. -Empiric meropenem to cover resistant gram negatives and nocardia. -Continue duo nebs every 4 hours -Continue Lasix to maintain euvolemia -Very high risk for intubation.  Continue to monitor in ICU.  We will plan to bronch for cultures and PJP DFA if he gets intubated. -Appreciate infectious disease team's assistance  Hyperkalemia due to Bactrim -Lokelma 5 mg twice daily today -Continue to monitor. Prefer to manage hyperkalemia medically rather than changing his regimen to something that is potentially less effective for PJP.  Discussed with ID and Pharm.D. -Continue to monitor renal function   DM2 with hyperglycemia -Increase Lantus to 8 units twice daily -Continue Accu-Cheks with sliding scale insulin as needed-increase to resistant dosing -Goal blood glucose 140-180 while admitted to the ICU   Chronic anemia-stable -Transfuse for hemoglobin less than 7 or hemodynamically significant bleeding -Continue to monitor  Oral thrush -Continue fluconazole and magic mouthwash PRN  History of Hodgkin's lymphoma -Currently holding chemotherapy -DVT prophylaxis  HLD -Continue PTA statin  Best practice:  Diet: NPO Pain/Anxiety/Delirium protocol (if indicated): n/a VAP protocol (if indicated): n/a DVT prophylaxis: lovenox GI prophylaxis: protonix Glucose control: SSI Mobility: up with assist Code Status: full Family Communication: wife- surrogate decision maker Disposition: ICU  Labs   CBC: Recent Labs  Lab 07/08/19 1244 07/08/19 1331 07/09/19 0510 07/10/19 0605 07/11/19 0212 07/12/19 0408 07/13/19 0321  WBC 37.6*   < > 35.1* 31.6* 25.8*  24.1* 21.3*  NEUTROABS 33.4*  --   --   --   --   --   --   HGB 8.9*   < > 7.7* 7.3* 7.6* 8.3* 8.2*  HCT 28.6*   < > 24.6* 23.4* 24.4* 26.3* 26.3*  MCV 91.4   < > 93.2 94.0 93.5 92.9 92.9  PLT 283   < > 231 215 194 221 248   < > = values in this interval not displayed.    Basic Metabolic Panel: Recent Labs  Lab 07/09/19 0510 07/10/19 0605 07/11/19 0212 07/12/19 0408 07/13/19 0321  NA 141 143 139 136 133*  K 3.7 3.5 3.9 4.1 5.1  CL 107 108 107 99 97*  CO2 25 27 25 27 28   GLUCOSE 167* 146* 161* 217* 246*  BUN 23 27* 23 20 27*  CREATININE 0.82 0.81 0.76 0.78 0.82  CALCIUM 8.0* 7.9* 7.8* 8.1* 8.2*  MG 1.7  --   --  2.0  --   PHOS 5.0*  --   --   --   --    GFR: Estimated Creatinine Clearance: 83.2 mL/min (by C-G formula based on SCr of 0.82 mg/dL). Recent Labs  Lab 07/08/19 1244 07/08/19 1244 07/08/19 1645 07/08/19 2020 07/08/19 2020 07/09/19 0510 07/09/19 0510 07/10/19 0605 07/11/19 0212 07/12/19 0408 07/13/19 0321  PROCALCITON  --   --   --  3.26  --  3.09  --  1.84  --   --   --   WBC 37.6*   < >  --  33.8*   < > 35.1*   < > 31.6* 25.8* 24.1* 21.3*  LATICACIDVEN 1.6  --  1.4  --   --   --   --   --   --   --   --    < > = values in this interval not displayed.    Liver Function Tests: Recent Labs  Lab 07/08/19 1244 07/09/19 0510  AST 25 23  ALT 12 13  ALKPHOS 85 77  BILITOT 1.1 1.0  PROT 7.3 6.3*  ALBUMIN 2.4* 2.0*   No results for input(s): LIPASE, AMYLASE in the last 168 hours. No results for input(s): AMMONIA in the last 168 hours.  ABG    Component Value Date/Time   PHART 7.473 (H) 07/08/2019 1245   PCO2ART 37.1 07/08/2019 1245   PO2ART 64.5 (L) 07/08/2019 1245   HCO3 26.8 07/08/2019 1245   TCO2 28 07/08/2019 1331   O2SAT 91.2 07/08/2019 1245     Coagulation Profile: No results for input(s): INR, PROTIME in the last 168 hours.  Cardiac Enzymes: No results for input(s): CKTOTAL, CKMB, CKMBINDEX, TROPONINI in the last 168  hours.  HbA1C: Hgb A1c MFr Bld  Date/Time Value Ref Range Status  07/09/2019 05:10 AM 7.3 (H) 4.8 - 5.6 % Final    Comment:    (NOTE) Pre diabetes:          5.7%-6.4% Diabetes:              >6.4% Glycemic control for   <7.0% adults with diabetes   05/19/2019 03:56 AM 8.3 (H) 4.8 - 5.6 % Final    Comment:    (NOTE) Pre diabetes:          5.7%-6.4% Diabetes:              >6.4% Glycemic control for   <7.0% adults with diabetes     CBG: Recent Labs  Lab 07/12/19 0832 07/12/19 1202 07/12/19 1620 07/12/19 2127 07/13/19 0753  GLUCAP 123* 211* 156* 163* 106*     This patient is critically ill with multiple organ system failure which requires frequent high complexity decision making, assessment, support, evaluation, and titration of therapies. This was completed through the application of advanced monitoring technologies and extensive interpretation of multiple databases. During this encounter critical care time was devoted to patient care services described in this note for 40 minutes.   Julian Hy, DO 07/13/19 11:22 AM [facility]

## 2019-07-13 NOTE — Progress Notes (Signed)
Pharmacy Antibiotic Note  Steve [name] is a 64 y.o. male admitted on 07/08/2019 with pneumonia.  Pharmacy has been consulted to add sulfamethoxazole-trimethoprim dosing due to concern for pneumocystis pneumonia (d/c vancomycin) and change from Cefepime to Meropenem.  Currently on day # 6 total abxs - D6 diflucan, D4 meropenem, and D3 Bactrim per ID SCr 0.82 stable WBC 21.3 improved, on steroids currently Afebrile PCT improving: 3.26 > 3.09 > 1.84 Cultures ngtd  Plan: Continue Sulfamethoxazole-trimethoprim 450 mg IV q8h (~ 18 mg/kg/day) Continue Meropenem 1g IV q8h Continue fluconazole 100mg  IV q24h. Follow up renal function, culture results, and clinical course.   Height: 5\' 6"  (167.6 cm) Weight: 68.8 kg (151 lb 10.8 oz) IBW/kg (Calculated) : 63.8  Temp (24hrs), Avg:97.3 F (36.3 C), Min:96.6 F (35.9 C), Max:98.3 F (36.8 C)  Recent Labs  Lab 07/08/19 1244 07/08/19 1331 07/08/19 1645 07/08/19 2020 07/09/19 0510 07/10/19 0605 07/11/19 0212 07/12/19 0408 07/13/19 0321  WBC 37.6*  --   --    < > 35.1* 31.6* 25.8* 24.1* 21.3*  CREATININE 0.92   < >  --    < > 0.82 0.81 0.76 0.78 0.82  LATICACIDVEN 1.6  --  1.4  --   --   --   --   --   --    < > = values in this interval not displayed.    Estimated Creatinine Clearance: 83.2 mL/min (by C-G formula based on SCr of 0.82 mg/dL).    No Known Allergies  Antimicrobials this admission: 3/29 > cefepime >> 3/31 3/29 > vancomycin >>3/31 3/29 > Azithromycin >> 3/29 > Fluconazole >> 3/31 > Septra  >>  3/31 > Meropenem >>   Dose adjustments this admission:  Microbiology results: 3/29BCx: ngtd 3/29UCx: NGF  3/29Sputum: ngtd 3/29 MRSA PCR: neg 3/30 Blastomyces Ag: pending  Thank you for allowing pharmacy to be a part of this patient's care.   Adrian Saran, PharmD, BCPS 07/13/2019 9:41 AM

## 2019-07-13 NOTE — Progress Notes (Signed)
Patient ID: Steve [name], male   DOB: [birth year], 63 y.o.   MRN: [id number]         [facility] for Infectious Disease  Date of Admission:  07/08/2019   Total days of antibiotics 6        Day 6 fluconazole        Day 4 trimethoprim sulfamethoxazole and steroids        Day 4 meropenem         ASSESSMENT: His clinical and radiographic findings strongly suggest pneumocystis pneumonia.  I agree with continuing trimethoprim sulfamethoxazole and steroids even though he has some sulfa induced hyperkalemia.  PLAN: 1. Continue current antibiotics pending sputum pneumocystis smear, Gram stain and culture 2. Monitor potassium  Principal Problem:   HCAP (healthcare-associated pneumonia) Active Problems:   Hodgkin's lymphoma ([place])   Malnutrition of moderate degree   Hypoxia   Acute on chronic respiratory failure ([name])   Diabetes mellitus ([name])   Scheduled Meds: . atorvastatin  10 mg Oral Daily  . Chlorhexidine Gluconate Cloth  6 each Topical Daily  . enoxaparin (LOVENOX) injection  40 mg Subcutaneous Q24H  . feeding supplement (ENSURE ENLIVE)  237 mL Oral BID BM  . feeding supplement (PRO-STAT SUGAR FREE 64)  30 mL Oral BID  . ferrous sulfate  325 mg Oral Q breakfast  . insulin aspart  0-9 Units Subcutaneous TID WC  . insulin aspart  5 Units Subcutaneous TID WC  . insulin glargine  5 Units Subcutaneous BID  . ipratropium-albuterol  3 mL Nebulization Q4H  . mouth rinse  15 mL Mouth Rinse BID  . methylPREDNISolone (SOLU-MEDROL) injection  60 mg Intravenous BID  . multivitamin with minerals  2 tablet Oral Daily  . pantoprazole (PROTONIX) IV  40 mg Intravenous QHS  . sodium chloride flush  10-40 mL Intracatheter Q12H   Continuous Infusions: . fluconazole (DIFLUCAN) IV Stopped (07/12/19 1138)  . meropenem (MERREM) IV 1 g (07/13/19 0932)  . sulfamethoxazole-trimethoprim 450 mg (07/13/19 0936)   PRN Meds:.acetaminophen, acetaminophen, lip balm, magic mouthwash w/lidocaine,  melatonin, ondansetron (ZOFRAN) IV, sodium chloride   SUBJECTIVE: He tells me that he feels like he is breathing easier today.  Notes he is most comfortable when he is reclining slightly in bed.  He says it is more difficult to breathe if he lays flat or sit straight up.  Review of Systems: Review of Systems  Constitutional: Positive for malaise/fatigue. Negative for fever.  Respiratory: Positive for cough, sputum production and shortness of breath.   Cardiovascular: Negative for chest pain.  Gastrointestinal: Negative for abdominal pain, diarrhea, nausea and vomiting.    No Known Allergies  OBJECTIVE: Vitals:   07/13/19 0600 07/13/19 0800 07/13/19 0838 07/13/19 0900  BP: 125/70 104/75  128/78  Pulse: 80 78  81  Resp: (!) 31 (!) 26  (!) 28  Temp:  (!) 97.5 F (36.4 C)    TempSrc:  Oral    SpO2: 93% 91% 94% 96%  Weight:      Height:       Body mass index is 24.48 kg/m.  Physical Exam Constitutional:      Comments: He is sitting up in bed watching golf on TV.  He is pleasant and in no distress other than slight increased work of breathing.  Cardiovascular:     Rate and Rhythm: Normal rate and regular rhythm.     Heart sounds: No murmur.  Pulmonary:     Comments: Clear anteriorly. Chest:  Lab Results Lab Results  Component Value Date   WBC 21.3 (H) 07/13/2019   HGB 8.2 (L) 07/13/2019   HCT 26.3 (L) 07/13/2019   MCV 92.9 07/13/2019   PLT 248 07/13/2019    Lab Results  Component Value Date   CREATININE 0.82 07/13/2019   BUN 27 (H) 07/13/2019   NA 133 (L) 07/13/2019   K 5.1 07/13/2019   CL 97 (L) 07/13/2019   CO2 28 07/13/2019    Lab Results  Component Value Date   ALT 13 07/09/2019   AST 23 07/09/2019   ALKPHOS 77 07/09/2019   BILITOT 1.0 07/09/2019     Microbiology: Recent Results (from the past 240 hour(s))  Blood Culture (routine x 2)     Status: None (Preliminary result)   Collection Time: 07/08/19  1:20 PM   Specimen: BLOOD  Result  Value Ref Range Status   Specimen Description   Final    BLOOD RIGHT ANTECUBITAL Performed at [facility], [place] [address] [place], [place] [address]    Special Requests   Final    BOTTLES DRAWN AEROBIC AND ANAEROBIC Blood Culture adequate volume Performed at [place] [facility] [address] [place], [place] [address]    Culture   Final    NO GROWTH 4 DAYS Performed at [facility], [place] [address], [place], [place] [address]    Report Status PENDING  Incomplete  Blood Culture (routine x 2)     Status: None (Preliminary result)   Collection Time: 07/08/19  1:25 PM   Specimen: BLOOD  Result Value Ref Range Status   Specimen Description   Final    BLOOD RIGHT ANTECUBITAL Performed at [name] [address], [place], [place] [address]    Special Requests   Final    BOTTLES DRAWN AEROBIC AND ANAEROBIC Blood Culture adequate volume Performed at [facility] [address], [place], [place] [address]    Culture   Final    NO GROWTH 4 DAYS Performed at [facility], [place] [address], [place], [place] [address]    Report Status PENDING  Incomplete  Urine culture     Status: None   Collection Time: 07/08/19  6:30 PM   Specimen: In/Out Cath Urine  Result Value Ref Range Status   Specimen Description   Final    IN/OUT CATH URINE Performed at [facility] [address], [place], [place] [address]    Special Requests   Final    NONE Performed at [facility], [place] [address], [place], [place] [address]    Culture   Final    NO GROWTH Performed at [facility], [place] [address], [place], [place] [address]    Report Status 07/09/2019 FINAL  Final  MRSA PCR Screening     Status: None   Collection Time: 07/08/19  9:29 PM   Specimen: Nasopharyngeal  Result Value Ref Range Status   MRSA by PCR NEGATIVE NEGATIVE Final    Comment:        The GeneXpert  MRSA Assay (FDA approved for NASAL specimens only), is one component of a comprehensive MRSA colonization surveillance program. It is not intended to diagnose MRSA infection nor to guide or monitor treatment for MRSA infections. Performed at [facility], [place] [address], [place], [place] [address]     Michel Bickers, [place] [facility] Infectious [facility]  [phone] pager   [phone] cell 07/13/2019, 10:04 AM

## 2019-07-14 DIAGNOSIS — J189 Pneumonia, unspecified organism: Secondary | ICD-10-CM | POA: Diagnosis not present

## 2019-07-14 DIAGNOSIS — R0602 Shortness of breath: Secondary | ICD-10-CM | POA: Diagnosis not present

## 2019-07-14 DIAGNOSIS — J9621 Acute and chronic respiratory failure with hypoxia: Secondary | ICD-10-CM | POA: Diagnosis not present

## 2019-07-14 DIAGNOSIS — R05 Cough: Secondary | ICD-10-CM | POA: Diagnosis not present

## 2019-07-14 DIAGNOSIS — E1165 Type 2 diabetes mellitus with hyperglycemia: Secondary | ICD-10-CM | POA: Diagnosis not present

## 2019-07-14 DIAGNOSIS — R093 Abnormal sputum: Secondary | ICD-10-CM | POA: Diagnosis not present

## 2019-07-14 DIAGNOSIS — C8118 Nodular sclerosis classical Hodgkin lymphoma, lymph nodes of multiple sites: Secondary | ICD-10-CM | POA: Diagnosis not present

## 2019-07-14 DIAGNOSIS — E440 Moderate protein-calorie malnutrition: Secondary | ICD-10-CM

## 2019-07-14 DIAGNOSIS — E875 Hyperkalemia: Secondary | ICD-10-CM | POA: Diagnosis not present

## 2019-07-14 LAB — BASIC METABOLIC PANEL
Anion gap: 9 (ref 5–15)
BUN: 31 mg/dL — ABNORMAL HIGH (ref 8–23)
CO2: 26 mmol/L (ref 22–32)
Calcium: 8.5 mg/dL — ABNORMAL LOW (ref 8.9–10.3)
Chloride: 96 mmol/L — ABNORMAL LOW (ref 98–111)
Creatinine, Ser: 0.89 mg/dL (ref 0.61–1.24)
GFR calc Af Amer: >60 mL/min (ref >60)
GFR calc non Af Amer: >60 mL/min (ref >60)
Glucose, Bld: 174 mg/dL — ABNORMAL HIGH (ref 70–99)
Potassium: 5.2 mmol/L — ABNORMAL HIGH (ref 3.5–5.1)
Sodium: 131 mmol/L — ABNORMAL LOW (ref 135–145)

## 2019-07-14 LAB — CBC
HCT: 28.3 % — ABNORMAL LOW (ref 39.0–52.0)
Hemoglobin: 8.9 g/dL — ABNORMAL LOW (ref 13.0–17.0)
MCH: 29 pg (ref 26.0–34.0)
MCHC: 31.4 g/dL (ref 30.0–36.0)
MCV: 92.2 fL (ref 80.0–100.0)
Platelets: 302 10*3/uL (ref 150–400)
RBC: 3.07 MIL/uL — ABNORMAL LOW (ref 4.22–5.81)
RDW: 21.3 % — ABNORMAL HIGH (ref 11.5–15.5)
WBC: 22 10*3/uL — ABNORMAL HIGH (ref 4.0–10.5)
nRBC: 0 % (ref 0.0–0.2)

## 2019-07-14 LAB — EXPECTORATED SPUTUM ASSESSMENT W GRAM STAIN, RFLX TO RESP C

## 2019-07-14 LAB — GLUCOSE, CAPILLARY
Glucose-Capillary: 84 mg/dL (ref 70–99)
Glucose-Capillary: 167 mg/dL — ABNORMAL HIGH (ref 70–99)
Glucose-Capillary: 77 mg/dL (ref 70–99)
Glucose-Capillary: 210 mg/dL — ABNORMAL HIGH (ref 70–99)
Glucose-Capillary: 168 mg/dL — ABNORMAL HIGH (ref 70–99)
Glucose-Capillary: 198 mg/dL — ABNORMAL HIGH (ref 70–99)
Glucose-Capillary: 94 mg/dL (ref 70–99)

## 2019-07-14 LAB — BRAIN NATRIURETIC PEPTIDE: B Natriuretic Peptide: 55.9 pg/mL (ref 0.0–100.0)

## 2019-07-14 LAB — MAGNESIUM: Magnesium: 2.1 mg/dL (ref 1.7–2.4)

## 2019-07-14 NOTE — Progress Notes (Signed)
Patient ID: ROYALE SWAMY, male   DOB: Apr 09, 1956, 64 y.o.   MRN: [id number]         [facility] for Infectious Disease  Date of Admission:  07/08/2019   Total days of antibiotics 7        Day 7 fluconazole        Day 5 trimethoprim sulfamethoxazole and steroids        Day 5 meropenem         ASSESSMENT: I still certainly concerned about severe pneumocystis pneumonia.  His pneumocystis smear is still pending.  He was symptomatic for weeks before admission making response to therapy or sluggish.  His potassium remains elevated but I still favor continuing empiric trimethoprim sulfamethoxazole.  I will consider stopping meropenem soon.  PLAN: 1. Continue trimethoprim sulfamethoxazole pending pneumocystis smear, Gram stain and culture 2. Monitor potassium  Principal Problem:   HCAP (healthcare-associated pneumonia) Active Problems:   Hodgkin's lymphoma ([place])   Malnutrition of moderate degree   Hypoxia   Acute on chronic respiratory failure ([name])   Diabetes mellitus ([name])   Scheduled Meds: . atorvastatin  10 mg Oral Daily  . Chlorhexidine Gluconate Cloth  6 each Topical Daily  . enoxaparin (LOVENOX) injection  40 mg Subcutaneous Q24H  . feeding supplement (ENSURE ENLIVE)  237 mL Oral BID BM  . feeding supplement (PRO-STAT SUGAR FREE 64)  30 mL Oral BID  . ferrous sulfate  325 mg Oral Q breakfast  . insulin aspart  3-9 Units Subcutaneous Q4H  . insulin aspart  5 Units Subcutaneous TID WC  . insulin glargine  8 Units Subcutaneous BID  . ipratropium-albuterol  3 mL Nebulization Q4H  . mouth rinse  15 mL Mouth Rinse BID  . methylPREDNISolone (SOLU-MEDROL) injection  60 mg Intravenous BID  . multivitamin with minerals  2 tablet Oral Daily  . pantoprazole (PROTONIX) IV  40 mg Intravenous QHS  . sodium chloride flush  10-40 mL Intracatheter Q12H  . sodium zirconium cyclosilicate  10 g Oral Daily   Continuous Infusions: . fluconazole (DIFLUCAN) IV Stopped (07/14/19 1142)  .  meropenem (MERREM) IV Stopped (07/14/19 1031)  . sulfamethoxazole-trimethoprim Stopped (07/14/19 1127)   PRN Meds:.acetaminophen, acetaminophen, lip balm, magic mouthwash w/lidocaine, melatonin, ondansetron (ZOFRAN) IV, sodium chloride   SUBJECTIVE: He says that his cough is more productive now.  He is still short of breath.  His appetite remains very poor and he says that food does not taste very good.  Review of Systems: Review of Systems  Constitutional: Positive for malaise/fatigue. Negative for fever.  Respiratory: Positive for cough, sputum production and shortness of breath.   Cardiovascular: Negative for chest pain.  Gastrointestinal: Negative for abdominal pain, diarrhea, nausea and vomiting.    No Known Allergies  OBJECTIVE: Vitals:   07/14/19 0945 07/14/19 1000 07/14/19 1100 07/14/19 1144  BP:  117/70 (!) 119/59   Pulse: 76 77 69 89  Resp: 20 (!) 27 (!) 24 (!) 28  Temp:      TempSrc:      SpO2: 99% 90% 99% 91%  Weight:      Height:       Body mass index is 24.37 kg/m.  Physical Exam Constitutional:      Comments: He is sitting up in bed talking with a friend.  Cardiovascular:     Rate and Rhythm: Normal rate and regular rhythm.     Heart sounds: No murmur.  Pulmonary:     Comments: He remains on high  flow nonrebreather mask and desaturates easily. Chest:       Lab Results Lab Results  Component Value Date   WBC 22.0 (H) 07/14/2019   HGB 8.9 (L) 07/14/2019   HCT 28.3 (L) 07/14/2019   MCV 92.2 07/14/2019   PLT 302 07/14/2019    Lab Results  Component Value Date   CREATININE 0.89 07/14/2019   BUN 31 (H) 07/14/2019   NA 131 (L) 07/14/2019   K 5.2 (H) 07/14/2019   CL 96 (L) 07/14/2019   CO2 26 07/14/2019    Lab Results  Component Value Date   ALT 13 07/09/2019   AST 23 07/09/2019   ALKPHOS 77 07/09/2019   BILITOT 1.0 07/09/2019     Microbiology: Recent Results (from the past 240 hour(s))  Blood Culture (routine x 2)     Status: None    Collection Time: 07/08/19  1:20 PM   Specimen: BLOOD  Result Value Ref Range Status   Specimen Description   Final    BLOOD RIGHT ANTECUBITAL Performed at [facility], [place] [address] [place], [place] [address]    Special Requests   Final    BOTTLES DRAWN AEROBIC AND ANAEROBIC Blood Culture adequate volume Performed at [place] [address], [place], [place] [address]    Culture   Final    NO GROWTH 5 DAYS Performed at [facility], [place] [address], [place], [place] [address]    Report Status 07/13/2019 FINAL  Final  Blood Culture (routine x 2)     Status: None   Collection Time: 07/08/19  1:25 PM   Specimen: BLOOD  Result Value Ref Range Status   Specimen Description   Final    BLOOD RIGHT ANTECUBITAL Performed at [facility] [address], [place], [place] [address]    Special Requests   Final    BOTTLES DRAWN AEROBIC AND ANAEROBIC Blood Culture adequate volume Performed at [facility] [address] [place], [place] [address]    Culture   Final    NO GROWTH 5 DAYS Performed at [facility], [place] [address], [place], [place] [address]    Report Status 07/13/2019 FINAL  Final  Urine culture     Status: None   Collection Time: 07/08/19  6:30 PM   Specimen: In/Out Cath Urine  Result Value Ref Range Status   Specimen Description   Final    IN/OUT CATH URINE Performed at [facility] [address], [place], [place] [address]    Special Requests   Final    NONE Performed at [facility], [place] [address], [place], [place] [address]    Culture   Final    NO GROWTH Performed at [facility], [place] [address] [place], [place] [address]    Report Status 07/09/2019 FINAL  Final  MRSA PCR Screening     Status: None   Collection Time: 07/08/19  9:29 PM   Specimen: Nasopharyngeal  Result Value Ref Range Status   MRSA by PCR NEGATIVE  NEGATIVE Final    Comment:        The GeneXpert MRSA Assay (FDA approved for NASAL specimens only), is one component of a comprehensive MRSA colonization surveillance program. It is not intended to diagnose MRSA infection nor to guide or monitor treatment for MRSA infections. Performed at [facility], [place] [address] [place], [place] [address]  Michel Bickers, MD [facility] for Infectious [facility] [phone] pager   [phone] cell 07/14/2019, 12:07 PM

## 2019-07-14 NOTE — Progress Notes (Signed)
NAME:  Steve [name], MRN:  [id number], DOB:  1955-07-07, LOS: 6 ADMISSION DATE:  07/08/2019, CONSULTATION DATE:  3/29  REFERRING MD:  [name], CHIEF COMPLAINT:  dyspnea   Brief History   64 y/o male admitted with severe acute respiratory failure with hypoxemia in the setting of receiving chemotherapy for Hodgkin's lymphoma.    Past Medical History  Hodgkin's lymphoma- last chemo 3/8 Staph septic arthritis of hip arthroplasty bilaterally Covid 19 viral pneumonia > February 2021 DM OSA HTN Pancreatitis GERD  Significant Hospital Events     Consults:  ID Oncology  Procedures:  Cefepime 3/29>> 3/31 vanc 3/29>> 3/30 Azithromycin 3/29>>4/1 Fluconazole 3/29>> Meropenem 3/31>> Bactrim 3/31>>  Significant Diagnostic Tests:  CTA chest 3/29 > no PE, diffuse GGO upper lobes, patchy consolidation in bases, peripheral fibrotic changes noted on prior CT chest still present Echo 3/30> LVEF 60 to 15%, grade 1 diastolic dysfunction.  Mildly dilated LA, normal RV.  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 rate (!) 36, height 5\' 6"  (1.676 m), weight 68.5 kg, SpO2 (!) 85 %.    FiO2 (%):  [100 %] 100 %   Intake/Output Summary (Last 24 hours) at 07/14/2019 0902 Last data filed at 07/14/2019 0630 Gross per 24 hour  Intake 2395.99 ml  Output 2425 ml  Net -29.01 ml   Filed Weights   07/12/19 0500 07/13/19 0500 07/14/19 0500  Weight: 72.7 kg  68.8 kg 68.5 kg    Examination:  General:  Chronically ill appearing, increased work of breathing in bed HENT: NCAT OP clear PULM: Crackles bases B, normal effort CV: RRR, no mgr GI: BS+, soft, nontender MSK: normal bulk and tone Neuro: awake, alert, no distress, MAEW   Resolved Hospital Problem list     Assessment & Plan:  Acute hypoxemic respiratory failure: unclear etiology, could very well be infectious pneumonia or drug effect (brentuximab).  Brentuximab carries 5% risk of pulmonary toxicity which occurs weeks into administration, treated with steroids and withholding agent. Not eating> he will try today but if he can't eat then he needs to be intubated, receive enteral tube feeding Continue solumedrol Continue PCP and bacterial pneumonia treatment Continue to monitor pending infectious work up (PCP DFA smear) Bronch if intubated Intubate if can't eat today  Hyperkalemia lokelma Monitor BMET and UOP Replace electrolytes as needed  DM2 SSI + glargine  Chronic anemia Monitor for bleeding Transfuse PRBC for Hgb < 7 gm/dL  Oral thrush> improved but still complains of thick coating in mouth and coughing up thick mucus Continue mouth care per routine  Hodgkin's lymphoma Per oncology  HLD Statin  Best practice:  Diet: regular diet, will need tube feeding if can't eat today Pain/Anxiety/Delirium protocol (if indicated): n/a VAP protocol (if indicated): n/a DVT prophylaxis: lovenox GI prophylaxis: n/a Glucose control: SSI + glargine Mobility: bed rest Code Status: full, confirmed again on 4/4 Family Communication: updated his sister bedside Disposition: remain in ICU  Labs   CBC: Recent Labs  Lab 07/08/19 1244 07/08/19 1331 07/10/19 0605 07/11/19 0212 07/12/19 0408 07/13/19 0321 07/14/19 0458  WBC 37.6*   < > 31.6* 25.8* 24.1* 21.3* 22.0*  NEUTROABS 33.4*  --   --   --   --   --   --   HGB 8.9*   < > 7.3* 7.6* 8.3* 8.2* 8.9*  HCT 28.6*   < > 23.4*  24.4* 26.3* 26.3* 28.3*  MCV 91.4   < > 94.0 93.5 92.9 92.9 92.2  PLT 283   < > 215 194 221 248 302   < > = values in this interval not displayed.    Basic Metabolic Panel: Recent Labs  Lab 07/09/19 0510 07/09/19 0510 07/10/19 6160 07/11/19 0212 07/12/19 0408 07/13/19 0321 07/14/19 0458  NA 141   < > 143 139 136 133* 131*  K 3.7   < > 3.5 3.9 4.1 5.1 5.2*  CL 107   < > 108 107 99 97* 96*  CO2 25   < > 27 25 27 28 26   GLUCOSE 167*   < > 146* 161* 217* 246* 174*  BUN 23   < > 27* 23 20 27* 31*  CREATININE 0.82   < > 0.81 0.76 0.78 0.82 0.89  CALCIUM 8.0*   < > 7.9* 7.8* 8.1* 8.2* 8.5*  MG 1.7  --   --   --  2.0  --  2.1  PHOS 5.0*  --   --   --   --   --   --    < > = values in this interval not displayed.   GFR: Estimated Creatinine Clearance: 76.7 mL/min (by C-G formula based on SCr of 0.89 mg/dL). Recent Labs  Lab 07/08/19 1244 07/08/19 1244 07/08/19 1645 07/08/19 2020 07/08/19 2020 07/09/19 0510 07/09/19 0510 07/10/19 7371 07/10/19 0626 07/11/19 0212 07/12/19 0408 07/13/19 0321 07/14/19 0458  PROCALCITON  --   --   --  3.26  --  3.09  --  1.84  --   --   --   --   --   WBC 37.6*   < >  --  33.8*   < > 35.1*   < > 31.6*   < > 25.8* 24.1* 21.3* 22.0*  LATICACIDVEN 1.6  --  1.4  --   --   --   --   --   --   --   --   --   --    < > = values in this interval not displayed.    Liver Function Tests: Recent Labs  Lab 07/08/19 1244 07/09/19 0510  AST 25 23  ALT 12 13  ALKPHOS 85 77  BILITOT 1.1 1.0  PROT 7.3 6.3*  ALBUMIN 2.4* 2.0*   No results for input(s): LIPASE, AMYLASE in the last 168 hours. No results for input(s): AMMONIA in the last 168 hours.  ABG    Component Value Date/Time   PHART 7.473 (H) 07/08/2019 1245   PCO2ART 37.1 07/08/2019 1245   PO2ART 64.5 (L) 07/08/2019 1245   HCO3 26.8 07/08/2019 1245   TCO2 28 07/08/2019 1331   O2SAT 91.2 07/08/2019 1245     Coagulation Profile: No results for input(s): INR, PROTIME in the last 168  hours.  Cardiac Enzymes: No results for input(s): CKTOTAL, CKMB, CKMBINDEX, TROPONINI in the last 168 hours.  HbA1C: Hgb A1c MFr Bld  Date/Time Value Ref Range Status  07/09/2019 05:10 AM  7.3 (H) 4.8 - 5.6 % Final    Comment:    (NOTE) Pre diabetes:          5.7%-6.4% Diabetes:              >6.4% Glycemic control for   <7.0% adults with diabetes   05/19/2019 03:56 AM 8.3 (H) 4.8 - 5.6 % Final    Comment:    (NOTE) Pre diabetes:          5.7%-6.4% Diabetes:              >6.4% Glycemic control for   <7.0% adults with diabetes     CBG: Recent Labs  Lab 07/13/19 1537 07/13/19 2003 07/13/19 2357 07/14/19 0504 07/14/19 0756  GLUCAP 118* 216* 84 167* 77       Critical care time: 35 minutes    [name], MD [facility] Pager: [phone] Cell: [phone] If no response, call [phone]

## 2019-07-15 ENCOUNTER — Ambulatory Visit: Payer: BC Managed Care – PPO

## 2019-07-15 ENCOUNTER — Other Ambulatory Visit: Payer: BC Managed Care – PPO

## 2019-07-15 ENCOUNTER — Ambulatory Visit: Payer: BC Managed Care – PPO | Admitting: Hematology

## 2019-07-15 DIAGNOSIS — B59 Pneumocystosis: Secondary | ICD-10-CM

## 2019-07-15 DIAGNOSIS — C8100 Nodular lymphocyte predominant Hodgkin lymphoma, unspecified site: Secondary | ICD-10-CM

## 2019-07-15 DIAGNOSIS — R918 Other nonspecific abnormal finding of lung field: Secondary | ICD-10-CM | POA: Diagnosis not present

## 2019-07-15 DIAGNOSIS — J9621 Acute and chronic respiratory failure with hypoxia: Secondary | ICD-10-CM | POA: Diagnosis not present

## 2019-07-15 DIAGNOSIS — C8190 Hodgkin lymphoma, unspecified, unspecified site: Secondary | ICD-10-CM | POA: Diagnosis not present

## 2019-07-15 DIAGNOSIS — B379 Candidiasis, unspecified: Secondary | ICD-10-CM | POA: Diagnosis not present

## 2019-07-15 DIAGNOSIS — J189 Pneumonia, unspecified organism: Secondary | ICD-10-CM | POA: Diagnosis not present

## 2019-07-15 DIAGNOSIS — J9620 Acute and chronic respiratory failure, unspecified whether with hypoxia or hypercapnia: Secondary | ICD-10-CM | POA: Diagnosis not present

## 2019-07-15 LAB — CBC WITH DIFFERENTIAL/PLATELET
Abs Immature Granulocytes: 0.15 10*3/uL — ABNORMAL HIGH (ref 0.00–0.07)
Basophils Absolute: 0 10*3/uL (ref 0.0–0.1)
Basophils Relative: 0 %
Eosinophils Absolute: 0 10*3/uL (ref 0.0–0.5)
Eosinophils Relative: 0 %
HCT: 29.7 % — ABNORMAL LOW (ref 39.0–52.0)
Hemoglobin: 9.3 g/dL — ABNORMAL LOW (ref 13.0–17.0)
Immature Granulocytes: 1 %
Lymphocytes Relative: 6 %
Lymphs Abs: 1.2 10*3/uL (ref 0.7–4.0)
MCH: 29.3 pg (ref 26.0–34.0)
MCHC: 31.3 g/dL (ref 30.0–36.0)
MCV: 93.7 fL (ref 80.0–100.0)
Monocytes Absolute: 1 10*3/uL (ref 0.1–1.0)
Monocytes Relative: 6 %
Neutro Abs: 16.6 10*3/uL — ABNORMAL HIGH (ref 1.7–7.7)
Neutrophils Relative %: 87 %
Platelets: 318 10*3/uL (ref 150–400)
RBC: 3.17 MIL/uL — ABNORMAL LOW (ref 4.22–5.81)
RDW: 20.9 % — ABNORMAL HIGH (ref 11.5–15.5)
WBC: 19.1 10*3/uL — ABNORMAL HIGH (ref 4.0–10.5)
nRBC: 0 % (ref 0.0–0.2)

## 2019-07-15 LAB — COMPREHENSIVE METABOLIC PANEL
ALT: 17 U/L (ref 0–44)
AST: 18 U/L (ref 15–41)
Albumin: 2.2 g/dL — ABNORMAL LOW (ref 3.5–5.0)
Alkaline Phosphatase: 70 U/L (ref 38–126)
Anion gap: 8 (ref 5–15)
BUN: 32 mg/dL — ABNORMAL HIGH (ref 8–23)
CO2: 26 mmol/L (ref 22–32)
Calcium: 8.4 mg/dL — ABNORMAL LOW (ref 8.9–10.3)
Chloride: 95 mmol/L — ABNORMAL LOW (ref 98–111)
Creatinine, Ser: 0.91 mg/dL (ref 0.61–1.24)
GFR calc Af Amer: >60 mL/min (ref >60)
GFR calc non Af Amer: >60 mL/min (ref >60)
Glucose, Bld: 226 mg/dL — ABNORMAL HIGH (ref 70–99)
Potassium: 6.2 mmol/L — ABNORMAL HIGH (ref 3.5–5.1)
Sodium: 129 mmol/L — ABNORMAL LOW (ref 135–145)
Total Bilirubin: 0.4 mg/dL (ref 0.3–1.2)
Total Protein: 7.1 g/dL (ref 6.5–8.1)

## 2019-07-15 LAB — NA AND K (SODIUM & POTASSIUM), RAND UR
Potassium Urine: 22 mmol/L
Sodium, Ur: 114 mmol/L

## 2019-07-15 LAB — GLUCOSE, CAPILLARY
Glucose-Capillary: 200 mg/dL — ABNORMAL HIGH (ref 70–99)
Glucose-Capillary: 135 mg/dL — ABNORMAL HIGH (ref 70–99)
Glucose-Capillary: 106 mg/dL — ABNORMAL HIGH (ref 70–99)
Glucose-Capillary: 243 mg/dL — ABNORMAL HIGH (ref 70–99)
Glucose-Capillary: 128 mg/dL — ABNORMAL HIGH (ref 70–99)
Glucose-Capillary: 140 mg/dL — ABNORMAL HIGH (ref 70–99)

## 2019-07-15 LAB — POTASSIUM
Potassium: 5.7 mmol/L — ABNORMAL HIGH (ref 3.5–5.1)
Potassium: 5.8 mmol/L — ABNORMAL HIGH (ref 3.5–5.1)
Potassium: 5.7 mmol/L — ABNORMAL HIGH (ref 3.5–5.1)
Potassium: 5.7 mmol/L — ABNORMAL HIGH (ref 3.5–5.1)
Potassium: 6.3 mmol/L (ref 3.5–5.1)

## 2019-07-15 LAB — PNEUMOCYSTIS JIROVECI SMEAR BY DFA: Pneumocystis jiroveci Ag: NEGATIVE

## 2019-07-15 MED ORDER — SODIUM POLYSTYRENE SULFONATE 15 GM/60ML PO SUSP
30.0000 g | Freq: Once | ORAL | Status: AC
  Administered 2019-07-15: 30 g via ORAL
  Filled 2019-07-15: qty 120

## 2019-07-15 MED ORDER — SODIUM ZIRCONIUM CYCLOSILICATE 10 G PO PACK
10.0000 g | PACK | Freq: Once | ORAL | Status: AC
  Administered 2019-07-15: 10 g via ORAL
  Filled 2019-07-15: qty 1

## 2019-07-15 MED ORDER — CLINDAMYCIN PHOSPHATE 600 MG/50ML IV SOLN
600.0000 mg | Freq: Four times a day (QID) | INTRAVENOUS | Status: DC
  Administered 2019-07-15 – 2019-07-20 (×20): 600 mg via INTRAVENOUS
  Filled 2019-07-15 (×20): qty 50

## 2019-07-15 MED ORDER — DEXTROSE 50 % IV SOLN
1.0000 | Freq: Once | INTRAVENOUS | Status: AC
  Administered 2019-07-15: 50 mL via INTRAVENOUS
  Filled 2019-07-15: qty 50

## 2019-07-15 MED ORDER — INSULIN ASPART 100 UNIT/ML IV SOLN
10.0000 [IU] | Freq: Once | INTRAVENOUS | Status: AC
  Administered 2019-07-15: 10 [IU] via INTRAVENOUS

## 2019-07-15 MED ORDER — PRIMAQUINE PHOSPHATE 26.3 MG PO TABS
30.0000 mg | ORAL_TABLET | Freq: Every day | ORAL | Status: DC
  Administered 2019-07-15: 30 mg via ORAL
  Filled 2019-07-15 (×3): qty 2

## 2019-07-15 NOTE — Progress Notes (Signed)
Steve [name]   DOB:1955/04/13   HW#:[id number]    Assessment & Plan:   Acute on chronic hypoxic respiratory failure -Etiology unclear; ddx includes PCP pneumonia vs chemotherapy-induced pneumonitis -I reviewed the literature extensively on Adcetris-associated pulmonary toxicities, which is less than 5%, including a few case reports of severe pulmonary toxicities  -Furthermore, the diagnosis of Adcetris-associated pulmonary toxicities is generally a diagnosis of exclusion after ruling out infection and other causes, and the treatment in that case is high-dose steroid -Patient is currently on broad spectrum abx, including Bactrim, as well as IV Solumedrol 40mg  BID -ID is following and pursuing the work-up for PCP pneumonia; greatly appreciate their input -If the infectious work-up is completely negative, then it may be reasonable to increase the Solumedrol dose to 80 to 125mg  BID and see if that would lead to any improvement.   -I would also recommend considering work-up to rule out other causes of pulmonary infiltrates, including ANCA-related vasculitis (p-ANCA, c-ANCA, etc.)  Hodgkin lymphoma -Treatment on hold until further elucidation of the respiratory failure   Thank you for caring for our mutual patient. Please do not hesitate to contact us if there are any questions.   Tish Men, MD 07/15/2019  9:40 AM  Subjective:  Steve [name] reports that he is still very short of breath, and his breathing has not improved significantly since admission.  He denies any other complaint this morning.   ROS: Constitutional: ( - ) fevers, ( - )  chills , ( - ) night sweats Ears, nose, mouth, throat, and face: ( - ) mucositis, ( - ) sore throat Respiratory: ( + ) cough, ( + ) dyspnea, ( - ) wheezes Cardiovascular: ( - ) palpitation, ( - ) chest discomfort, ( - ) lower extremity swelling Gastrointestinal:  ( - ) nausea, ( - ) heartburn, ( - ) change in bowel habits Skin: ( - ) abnormal skin  rashes Behavioral/Psych: ( - ) mood change, ( - ) new changes  All other systems were reviewed with the patient and are negative.  Objective:  Vitals:   07/15/19 0800 07/15/19 0842  BP: (!) 153/136   Pulse: 95   Resp: (!) 35   Temp: (!) 97 F (36.1 C)   SpO2: (!) 75% (!) 83%     Intake/Output Summary (Last 24 hours) at 07/15/2019 0940 Last data filed at 07/15/2019 0630 Gross per 24 hour  Intake 1609.89 ml  Output 2075 ml  Net -465.11 ml    GENERAL: awake, ill appearing on breathing treatment  SKIN: skin color, texture, turgor are normal, no rashes or significant lesions EYES: conjunctiva are pink and non-injected, sclera clear NECK: supple, non-tender LUNGS: limited respiratory exam due to the noises from breathing treatment  HEART: regular rhythm, borderline tachycardic, and no lower extremity edema ABDOMEN: soft, non-tender, non-distended, normal bowel sounds PSYCH: alert & oriented, speaking in short sentences due to dyspnea    Labs:  Lab Results  Component Value Date   WBC 19.1 (H) 07/15/2019   HGB 9.3 (L) 07/15/2019   HCT 29.7 (L) 07/15/2019   MCV 93.7 07/15/2019   PLT 318 07/15/2019   NEUTROABS 16.6 (H) 07/15/2019    Lab Results  Component Value Date   NA 129 (L) 07/15/2019   K 5.8 (H) 07/15/2019   CL 95 (L) 07/15/2019   CO2 26 07/15/2019    Studies:  No results found.

## 2019-07-15 NOTE — Progress Notes (Signed)
eLink Physician-Brief Progress Note Patient Name: Steve [name] DOB: Jan 29, 1956 MRN: [id number]   Date of Service  07/15/2019  HPI/Events of Note  Notified of K 6.2  eICU Interventions  Ordered D50/insulin and Lokelma May need to adjust trimethoprim sulfamethoxazole dose of switch to alternative treatment for PJP     Intervention Category Major Interventions: Electrolyte abnormality - evaluation and management  Judd Lien 07/15/2019, 5:22 AM

## 2019-07-15 NOTE — Progress Notes (Unsigned)
Pharmacist Chemotherapy Monitoring - Follow Up Assessment    I verify that I have reviewed each item in the below checklist:  . Regimen for the patient is scheduled for the appropriate day and plan matches scheduled date. Marland Kitchen Appropriate non-routine labs are ordered dependent on drug ordered. . If applicable, additional medications reviewed and ordered per protocol based on lifetime cumulative doses and/or treatment regimen.   Plan for follow-up and/or issues identified: No . I-vent associated with next due treatment: No . MD and/or nursing notified: No  Steve [name], Steve [name] 07/15/2019 8:55 AM

## 2019-07-15 NOTE — Progress Notes (Signed)
Subjective:  No new complaints   Antibiotics:  Anti-infectives (From admission, onward)   Start     Dose/Rate Route Frequency Ordered Stop   07/15/19 1200  clindamycin (CLEOCIN) IVPB 600 mg     600 mg 100 mL/hr over 30 Minutes Intravenous Every 6 hours 07/15/19 1013     07/15/19 1100  primaquine tablet 30 mg     30 mg Oral Daily 07/15/19 1013     07/12/19 1000  fluconazole (DIFLUCAN) IVPB 100 mg     100 mg 50 mL/hr over 60 Minutes Intravenous Every 24 hours 07/12/19 0929     07/10/19 1800  meropenem (MERREM) 1 g in sodium chloride 0.9 % 100 mL IVPB     1 g 200 mL/hr over 30 Minutes Intravenous Every 8 hours 07/10/19 1425     07/10/19 0900  sulfamethoxazole-trimethoprim (BACTRIM) 450 mg in dextrose 5 % 500 mL IVPB  Status:  Discontinued     450 mg 352.1 mL/hr over 90 Minutes Intravenous Every 8 hours 07/10/19 0813 07/15/19 1013   07/09/19 1000  fluconazole (DIFLUCAN) tablet 100 mg  Status:  Discontinued     100 mg Oral Daily 07/08/19 1919 07/12/19 0929   07/09/19 0200  vancomycin (VANCOCIN) IVPB 1000 mg/200 mL premix  Status:  Discontinued     1,000 mg 200 mL/hr over 60 Minutes Intravenous Every 12 hours 07/08/19 1930 07/10/19 1017   07/08/19 2200  ceFEPIme (MAXIPIME) 2 g in sodium chloride 0.9 % 100 mL IVPB  Status:  Discontinued     2 g 200 mL/hr over 30 Minutes Intravenous Every 8 hours 07/08/19 1929 07/10/19 1425   07/08/19 2000  fluconazole (DIFLUCAN) tablet 200 mg     200 mg Oral  Once 07/08/19 1919 07/08/19 2005   07/08/19 1800  azithromycin (ZITHROMAX) 500 mg in sodium chloride 0.9 % 250 mL IVPB  Status:  Discontinued     500 mg 250 mL/hr over 60 Minutes Intravenous Every 24 hours 07/08/19 1736 07/12/19 0708   07/08/19 1345  ceFEPIme (MAXIPIME) 2 g in sodium chloride 0.9 % 100 mL IVPB     2 g 200 mL/hr over 30 Minutes Intravenous STAT 07/08/19 1343 07/08/19 1631   07/08/19 1345  vancomycin (VANCOREADY) IVPB 1500 mg/300 mL     1,500 mg 150 mL/hr over 120  Minutes Intravenous STAT 07/08/19 1343 07/08/19 1631      Medications: Scheduled Meds: . atorvastatin  10 mg Oral Daily  . Chlorhexidine Gluconate Cloth  6 each Topical Daily  . enoxaparin (LOVENOX) injection  40 mg Subcutaneous Q24H  . feeding supplement (ENSURE ENLIVE)  237 mL Oral BID BM  . feeding supplement (PRO-STAT SUGAR FREE 64)  30 mL Oral BID  . ferrous sulfate  325 mg Oral Q breakfast  . insulin aspart  3-9 Units Subcutaneous Q4H  . insulin aspart  5 Units Subcutaneous TID WC  . insulin glargine  8 Units Subcutaneous BID  . ipratropium-albuterol  3 mL Nebulization Q4H  . mouth rinse  15 mL Mouth Rinse BID  . methylPREDNISolone (SOLU-MEDROL) injection  60 mg Intravenous BID  . multivitamin with minerals  2 tablet Oral Daily  . pantoprazole (PROTONIX) IV  40 mg Intravenous QHS  . primaquine  30 mg Oral Daily  . sodium chloride flush  10-40 mL Intracatheter Q12H  . sodium zirconium cyclosilicate  10 g Oral Daily   Continuous Infusions: . clindamycin (CLEOCIN) IV Stopped (07/15/19 1248)  . fluconazole (  DIFLUCAN) IV Stopped (07/15/19 1141)  . meropenem (MERREM) IV Stopped (07/15/19 1117)   PRN Meds:.acetaminophen, acetaminophen, lip balm, magic mouthwash w/lidocaine, melatonin, ondansetron (ZOFRAN) IV, sodium chloride    Objective: Weight change:   Intake/Output Summary (Last 24 hours) at 07/15/2019 1546 Last data filed at 07/15/2019 1350 Gross per 24 hour  Intake 1137.88 ml  Output 2075 ml  Net -937.12 ml   Blood pressure (!) 119/58, pulse 93, temperature (!) 97 F (36.1 C), temperature source Axillary, resp. rate (!) 28, height _0  (1.676 m), weight 68.5 kg, SpO2 (!) 89 %. Temp:  [97 F (36.1 C)-97.5 F (36.4 C)] 97 F (36.1 C) (04/05 0800) Pulse Rate:  [67-95] 93 (04/05 1200) Resp:  [21-35] 28 (04/05 1200) BP: (104-153)/(54-136) 119/58 (04/05 1200) SpO2:  [75 %-95 %] 89 % (04/05 1543) FiO2 (%):  [100 %] 100 % (04/05 1543)  Physical Exam: General: Alert  and awake, oriented x3,  HEENT: anicteric sclera, EOMI CVS tachycardic Chest: , Tachypneic  Abdomen: soft non-distended,  Extremities: no edema or deformity noted bilaterally Skin: no rashes Neuro: nonfocal  CBC:    BMET Recent Labs    07/14/19 0458 07/14/19 0458 07/15/19 0332 07/15/19 0602 07/15/19 0830 07/15/19 1300  NA 131*  --  129*  --   --   --   K 5.2*   < > 6.2*   < > 5.8* 5.7*  CL 96*  --  95*  --   --   --   CO2 26  --  26  --   --   --   GLUCOSE 174*  --  226*  --   --   --   BUN 31*  --  32*  --   --   --   CREATININE 0.89  --  0.91  --   --   --   CALCIUM 8.5*  --  8.4*  --   --   --    < > = values in this interval not displayed.     Liver Panel  Recent Labs    07/15/19 0332  PROT 7.1  ALBUMIN 2.2*  AST 18  ALT 17  ALKPHOS 70  BILITOT 0.4       Sedimentation Rate No results for input(s): ESRSEDRATE in the last 72 hours. C-Reactive Protein No results for input(s): CRP in the last 72 hours.  Micro Results: Recent Results (from the past 720 hour(s))  Blood Culture (routine x 2)     Status: None   Collection Time: 07/08/19  1:20 PM   Specimen: BLOOD  Result Value Ref Range Status   Specimen Description   Final    BLOOD RIGHT ANTECUBITAL Performed at [facility] [address], [place], [place] [address]    Special Requests   Final    BOTTLES DRAWN AEROBIC AND ANAEROBIC Blood Culture adequate volume Performed at [facility] [address] [place], [place] [address]    Culture   Final    NO GROWTH 5 DAYS Performed at [facility], [place] [address] [place], [place] [address]    Report Status 07/13/2019 FINAL  Final  Blood Culture (routine x 2)     Status: None   Collection Time: 07/08/19  1:25 PM   Specimen: BLOOD  Result Value Ref Range Status   Specimen Description   Final    BLOOD RIGHT ANTECUBITAL Performed at [facility] [address] [place], [place]  [address]    Special Requests   Final    BOTTLES DRAWN AEROBIC AND ANAEROBIC Blood Culture adequate volume Performed at [facility] [address] [place], [place] [address]    Culture   Final    NO GROWTH 5 DAYS Performed at [facility], [place] [address], [place], [place] [address]    Report Status 07/13/2019 FINAL  Final  Urine culture     Status: None   Collection Time: 07/08/19  6:30 PM   Specimen: In/Out Cath Urine  Result Value Ref Range Status   Specimen Description   Final    IN/OUT CATH URINE Performed at [facility] [address] [place], [place], [place] [address]    Special Requests   Final    NONE Performed at [facility], [place] [address] [place], [place] [address]    Culture   Final    NO GROWTH Performed at [facility], [place] [address], [place], [place] [address]    Report Status 07/09/2019 FINAL  Final  MRSA PCR Screening     Status: None   Collection Time: 07/08/19  9:29 PM   Specimen: Nasopharyngeal  Result Value Ref Range Status   MRSA by PCR NEGATIVE NEGATIVE Final    Comment:        The GeneXpert MRSA Assay (FDA approved for NASAL specimens only), is one component of a comprehensive MRSA colonization surveillance program. It is not intended to diagnose MRSA infection nor to guide or monitor treatment for MRSA infections. Performed at [facility], [place] [address], [place], [place] [address]   Pneumocystis smear by DFA     Status: None   Collection Time: 07/12/19  2:26 PM   Specimen: Expectorated Sputum; Respiratory  Result Value Ref Range Status   Specimen Source-PJSRC EXPECTORATED SPUTUM  Final   Pneumocystis jiroveci Ag NEGATIVE  Final    Comment: Performed at [facility] Performed at [facility] [address] [place], [place] [address]   Expectorated sputum assessment w rflx to resp cult     Status: None   Collection  Time: 07/14/19 11:56 AM   Specimen: Sputum  Result Value Ref Range Status   Specimen Description SPUTUM  Final   Special Requests Immunocompromised  Final   Sputum evaluation   Final    THIS SPECIMEN IS ACCEPTABLE FOR SPUTUM CULTURE Performed at [facility], [place] [address], [place], [place] [address]    Report Status 07/14/2019 FINAL  Final  Culture, respiratory     Status: None (Preliminary result)   Collection Time: 07/14/19 11:56 AM   Specimen: SPU  Result Value Ref Range Status   Specimen Description   Final    SPUTUM Performed at [facility] [address], [place], [place] [address]    Special Requests   Final    Immunocompromised Reflexed from [id number] Performed at [facility], [place] [address], [place], [place] [address]    Gram Stain NO WBC SEEN NO ORGANISMS SEEN   Final   Culture   Final    CULTURE REINCUBATED FOR BETTER GROWTH Performed at [facility], [place] [address], [place],  [address]    Report Status PENDING  Incomplete    Studies/Results: No results found.    Assessment/Plan:  INTERVAL HISTORY:   Worsening hyperK  Principal Problem:   HCAP (healthcare-associated pneumonia) Active Problems:  Hodgkin's lymphoma ([place])   Malnutrition of moderate degree   Hypoxia   Acute on chronic respiratory failure ([name])   Diabetes mellitus ([name])    Steve [name] is a 64 y.o. male with Hodgkin's lymphoma on chemotherapy, with Covid pneumonia in February and supplemental oxygen requirements afterwards who presents with acute on chronic respiratory failure with bilateral diffuse pulmonary infiltrates.  Serum crypto ag, urine histo ag negative, legionella ag negative Fungitell is 231  1..Bilateral diffuse pulmonary infiltrates: Agree with continuing therapy with Bactrim and steroids., merrem   Clinically his picture fits including Fungitell for PCP and less likely with an invasive  mold  He has had trouble with hyper kalemia on TMP/SMX.   We will switch to clindamycin and primaquine (checking G6PD too)  He may very well require BAL for [place] to get clarity on whether this is pCP or another OI   2 Thrush: on fluconazole     LOS: 7 days   Alcide Evener 07/15/2019, 3:46 PM

## 2019-07-15 NOTE — Progress Notes (Signed)
eLink Physician-Brief Progress Note Patient Name: Steve [name] DOB: 1955/08/01 MRN: [id number]   Date of Service  07/15/2019  HPI/Events of Note  Hyperkalemia - K+ = 6.3. EKG without evidence of wide QRS or peaked T waves.   eICU Interventions  Will order: 1. Kayexalate 30 gm PO X 1 now.      Intervention Category Major Interventions: Electrolyte abnormality - evaluation and management  Jonaven Hilgers [place] 07/15/2019, 10:04 PM

## 2019-07-15 NOTE — Progress Notes (Signed)
NAME:  Steve [name], MRN:  [id number], DOB:  1956-02-15, LOS: 7 ADMISSION DATE:  07/08/2019, CONSULTATION DATE:  3/29  REFERRING MD:  [name], CHIEF COMPLAINT:  dyspnea   Brief History   64 y/o male admitted with severe acute respiratory failure with hypoxemia in the setting of receiving chemotherapy for Hodgkin's lymphoma.    Past Medical History  Hodgkin's lymphoma- last chemo 3/8 Staph septic arthritis of hip arthroplasty bilaterally Covid 19 viral pneumonia > February 2021 DM OSA HTN Pancreatitis GERD  Significant Hospital Events     Consults:  ID Oncology  Procedures:  Cefepime 3/29>> 3/31 vanc 3/29>> 3/30 Azithromycin 3/29>>4/1 Fluconazole 3/29>> Meropenem 3/31>> Bactrim 3/31>>  Significant Diagnostic Tests:  CTA chest 3/29 > no PE, diffuse GGO upper lobes, patchy consolidation in bases, peripheral fibrotic changes noted on prior CT chest still present Echo 3/30> LVEF 60 to 48%, grade 1 diastolic dysfunction.  Mildly dilated LA, normal RV.  Trivial MR and TR, otherwise normal valves  Micro Data:  3/29 blood >  3/29 urine >  3/30 blasto >  4/2 pneumocystis smear >  4/2 resp >    Antimicrobials:  Cefepime 3/29>> 3/31 vanc 3/29>> 3/30 Azithromycin 3/29>>4/1 Fluconazole 3/29>> Meropenem 3/31>> Bactrim 3/31>> Fungitel 3/29 > positive  Interim history/subjective:   K 6.2 Ate some yesterday  Feels a little better today  Objective   Blood pressure (!) 153/136, pulse 95, temperature (!) 97 F (36.1 C), temperature source Axillary, resp. rate (!) 35, height 5\' 6"  (1.676 m), weight 68.5 kg, SpO2 (!) 83 %.    FiO2 (%):  [91 %-100 %] 100 %   Intake/Output Summary (Last 24 hours) at 07/15/2019 0947 Last data filed at 07/15/2019 0630 Gross per 24 hour  Intake 1609.89 ml  Output 2075 ml  Net -465.11 ml   Filed Weights   07/12/19 0500 07/13/19 0500 07/14/19 0500  Weight: 72.7 kg 68.8 kg 68.5 kg    Examination:  General:  Resting comfortably in  bed HENT: NCAT OP clear PULM: Crackles bases B, normal effort CV: RRR, no mgr GI: BS+, soft, nontender MSK: normal bulk and tone Neuro: awake, alert, no distress, MAEW   Resolved Hospital Problem list     Assessment & Plan:  Acute hypoxemic respiratory failure: unclear etiology, could very well be infectious pneumonia or drug effect (brentuximab).  Brentuximab carries 5% risk of pulmonary toxicity which occurs weeks into administration, treated with steroids and withholding agent.  Small vessel vasculitis unlikely with single lung toxicity and this clinical syndrome. 4/5 eating better in last 24 hours, feeling better Continue solumedrol Continue empiric PCP treatment, has sputum pending in lab now Will discuss PCP treatment with ID as he was hyperkalemic today, likely bactrim related Bronch if intubated Maintain in ICU on HHF + NRB Tolerate periods of hypoxemia, goal at rest is greater than 85% SaO2, with movement ideally above 75% Decision for intubation should be based on a change in mental status or physical evidence of ventilatory failure such as nasal flaring, accessory muscle use, paradoxical breathing Out of bed to chair as able Incentive spirometry is important, use every hour Prone positioning while in bed  Hyperkalemia Monitor BMET and UOP Replace electrolytes as needed lokelma Will discuss with ID, need something other than bactrim  DM2 SSI + glargine   Chronic anemia Monitor for bleeding Transfuse PRBC for Hgb < 7 gm/dL  Oral thrush> improved but still complains of thick coating in mouth and coughing up thick mucus Continue mouth  care per routine  Hodgkin's lymphoma Per oncology  HLD Statin  Severe protein calorie malnutrition Dietary consult today for calorie count, increase caloric intake as much as possible  Physical deconditioning Out of bed today  Best practice:  Diet: regular diet, will need tube feeding if can't eat  today Pain/Anxiety/Delirium protocol (if indicated): n/a VAP protocol (if indicated): n/a DVT prophylaxis: lovenox GI prophylaxis: n/a Glucose control: SSI + glargine Mobility: bed rest Code Status: full, confirmed again on 4/4 Family Communication:  Disposition: remain in ICU  Labs   CBC: Recent Labs  Lab 07/08/19 1244 07/08/19 1331 07/11/19 0212 07/12/19 0408 07/13/19 0321 07/14/19 0458 07/15/19 0332  WBC 37.6*   < > 25.8* 24.1* 21.3* 22.0* 19.1*  NEUTROABS 33.4*  --   --   --   --   --  16.6*  HGB 8.9*   < > 7.6* 8.3* 8.2* 8.9* 9.3*  HCT 28.6*   < > 24.4* 26.3* 26.3* 28.3* 29.7*  MCV 91.4   < > 93.5 92.9 92.9 92.2 93.7  PLT 283   < > 194 221 248 302 318   < > = values in this interval not displayed.    Basic Metabolic Panel: Recent Labs  Lab 07/09/19 0510 07/10/19 1448 07/11/19 1856 07/11/19 3149 07/12/19 0408 07/12/19 0408 07/13/19 0321 07/14/19 0458 07/15/19 0332 07/15/19 0602 07/15/19 0830  NA 141   < > 139  --  136  --  133* 131* 129*  --   --   K 3.7   < > 3.9   < > 4.1   < > 5.1 5.2* 6.2* 5.7* 5.8*  CL 107   < > 107  --  99  --  97* 96* 95*  --   --   CO2 25   < > 25  --  27  --  28 26 26   --   --   GLUCOSE 167*   < > 161*  --  217*  --  246* 174* 226*  --   --   BUN 23   < > 23  --  20  --  27* 31* 32*  --   --   CREATININE 0.82   < > 0.76  --  0.78  --  0.82 0.89 0.91  --   --   CALCIUM 8.0*   < > 7.8*  --  8.1*  --  8.2* 8.5* 8.4*  --   --   MG 1.7  --   --   --  2.0  --   --  2.1  --   --   --   PHOS 5.0*  --   --   --   --   --   --   --   --   --   --    < > = values in this interval not displayed.   GFR: Estimated Creatinine Clearance: 75 mL/min (by C-G formula based on SCr of 0.91 mg/dL). Recent Labs  Lab 07/08/19 1244 07/08/19 1244 07/08/19 1645 07/08/19 2020 07/08/19 2020 07/09/19 0510 07/09/19 0510 07/10/19 7026 07/11/19 0212 07/12/19 0408 07/13/19 0321 07/14/19 0458 07/15/19 0332  PROCALCITON  --   --   --  3.26  --  3.09   --  1.84  --   --   --   --   --   WBC 37.6*   < >  --  33.8*   < > 35.1*   < >  31.6*   < > 24.1* 21.3* 22.0* 19.1*  LATICACIDVEN 1.6  --  1.4  --   --   --   --   --   --   --   --   --   --    < > = values in this interval not displayed.    Liver Function Tests: Recent Labs  Lab 07/08/19 1244 07/09/19 0510 07/15/19 0332  AST 25 23 18   ALT 12 13 17   ALKPHOS 85 77 70  BILITOT 1.1 1.0 0.4  PROT 7.3 6.3* 7.1  ALBUMIN 2.4* 2.0* 2.2*   No results for input(s): LIPASE, AMYLASE in the last 168 hours. No results for input(s): AMMONIA in the last 168 hours.  ABG    Component Value Date/Time   PHART 7.473 (H) 07/08/2019 1245   PCO2ART 37.1 07/08/2019 1245   PO2ART 64.5 (L) 07/08/2019 1245   HCO3 26.8 07/08/2019 1245   TCO2 28 07/08/2019 1331   O2SAT 91.2 07/08/2019 1245     Coagulation Profile: No results for input(s): INR, PROTIME in the last 168 hours.  Cardiac Enzymes: No results for input(s): CKTOTAL, CKMB, CKMBINDEX, TROPONINI in the last 168 hours.  HbA1C: Hgb A1c MFr Bld  Date/Time Value Ref Range Status  07/09/2019 05:10 AM 7.3 (H) 4.8 - 5.6 % Final    Comment:    (NOTE) Pre diabetes:          5.7%-6.4% Diabetes:              >6.4% Glycemic control for   <7.0% adults with diabetes   05/19/2019 03:56 AM 8.3 (H) 4.8 - 5.6 % Final    Comment:    (NOTE) Pre diabetes:          5.7%-6.4% Diabetes:              >6.4% Glycemic control for   <7.0% adults with diabetes     CBG: Recent Labs  Lab 07/14/19 1539 07/14/19 1955 07/14/19 2303 07/15/19 0337 07/15/19 0649  GLUCAP 168* 198* 94 200* 135*       Critical care time: 35 minutes    [name], MD [place] [facility] Pager: [phone] Cell: [phone] If no response, call [phone]

## 2019-07-16 ENCOUNTER — Inpatient Hospital Stay (HOSPITAL_COMMUNITY): Payer: BC Managed Care – PPO

## 2019-07-16 DIAGNOSIS — J189 Pneumonia, unspecified organism: Secondary | ICD-10-CM | POA: Diagnosis not present

## 2019-07-16 DIAGNOSIS — J9620 Acute and chronic respiratory failure, unspecified whether with hypoxia or hypercapnia: Secondary | ICD-10-CM | POA: Diagnosis not present

## 2019-07-16 DIAGNOSIS — B379 Candidiasis, unspecified: Secondary | ICD-10-CM | POA: Diagnosis not present

## 2019-07-16 DIAGNOSIS — R918 Other nonspecific abnormal finding of lung field: Secondary | ICD-10-CM | POA: Diagnosis not present

## 2019-07-16 DIAGNOSIS — C8100 Nodular lymphocyte predominant Hodgkin lymphoma, unspecified site: Secondary | ICD-10-CM | POA: Diagnosis not present

## 2019-07-16 DIAGNOSIS — C8190 Hodgkin lymphoma, unspecified, unspecified site: Secondary | ICD-10-CM | POA: Diagnosis not present

## 2019-07-16 DIAGNOSIS — E440 Moderate protein-calorie malnutrition: Secondary | ICD-10-CM | POA: Diagnosis not present

## 2019-07-16 DIAGNOSIS — J9621 Acute and chronic respiratory failure with hypoxia: Secondary | ICD-10-CM | POA: Diagnosis not present

## 2019-07-16 LAB — POTASSIUM
Potassium: 5.9 mmol/L — ABNORMAL HIGH (ref 3.5–5.1)
Potassium: 4.8 mmol/L (ref 3.5–5.1)
Potassium: 5.1 mmol/L (ref 3.5–5.1)
Potassium: 4.6 mmol/L (ref 3.5–5.1)

## 2019-07-16 LAB — CBC WITH DIFFERENTIAL/PLATELET
Abs Immature Granulocytes: 0.19 10*3/uL — ABNORMAL HIGH (ref 0.00–0.07)
Basophils Absolute: 0 10*3/uL (ref 0.0–0.1)
Basophils Relative: 0 %
Eosinophils Absolute: 0 10*3/uL (ref 0.0–0.5)
Eosinophils Relative: 0 %
HCT: 31.6 % — ABNORMAL LOW (ref 39.0–52.0)
Hemoglobin: 9.8 g/dL — ABNORMAL LOW (ref 13.0–17.0)
Immature Granulocytes: 1 %
Lymphocytes Relative: 6 %
Lymphs Abs: 1.1 10*3/uL (ref 0.7–4.0)
MCH: 28.7 pg (ref 26.0–34.0)
MCHC: 31 g/dL (ref 30.0–36.0)
MCV: 92.4 fL (ref 80.0–100.0)
Monocytes Absolute: 1.6 10*3/uL — ABNORMAL HIGH (ref 0.1–1.0)
Monocytes Relative: 9 %
Neutro Abs: 15.9 10*3/uL — ABNORMAL HIGH (ref 1.7–7.7)
Neutrophils Relative %: 84 %
Platelets: 359 10*3/uL (ref 150–400)
RBC: 3.42 MIL/uL — ABNORMAL LOW (ref 4.22–5.81)
RDW: 20.8 % — ABNORMAL HIGH (ref 11.5–15.5)
WBC: 18.8 10*3/uL — ABNORMAL HIGH (ref 4.0–10.5)
nRBC: 0 % (ref 0.0–0.2)

## 2019-07-16 LAB — GLUCOSE, CAPILLARY
Glucose-Capillary: 107 mg/dL — ABNORMAL HIGH (ref 70–99)
Glucose-Capillary: 135 mg/dL — ABNORMAL HIGH (ref 70–99)
Glucose-Capillary: 167 mg/dL — ABNORMAL HIGH (ref 70–99)
Glucose-Capillary: 184 mg/dL — ABNORMAL HIGH (ref 70–99)
Glucose-Capillary: 202 mg/dL — ABNORMAL HIGH (ref 70–99)
Glucose-Capillary: 84 mg/dL (ref 70–99)

## 2019-07-16 LAB — BLASTOMYCES ANTIGEN: Blastomyces Antigen: NOT DETECTED ng/mL

## 2019-07-16 LAB — COMPREHENSIVE METABOLIC PANEL
ALT: 33 U/L (ref 0–44)
AST: 37 U/L (ref 15–41)
Albumin: 2.5 g/dL — ABNORMAL LOW (ref 3.5–5.0)
Alkaline Phosphatase: 94 U/L (ref 38–126)
Anion gap: 9 (ref 5–15)
BUN: 39 mg/dL — ABNORMAL HIGH (ref 8–23)
CO2: 26 mmol/L (ref 22–32)
Calcium: 9.1 mg/dL (ref 8.9–10.3)
Chloride: 97 mmol/L — ABNORMAL LOW (ref 98–111)
Creatinine, Ser: 0.97 mg/dL (ref 0.61–1.24)
GFR calc Af Amer: >60 mL/min (ref >60)
GFR calc non Af Amer: >60 mL/min (ref >60)
Glucose, Bld: 161 mg/dL — ABNORMAL HIGH (ref 70–99)
Potassium: 5.9 mmol/L — ABNORMAL HIGH (ref 3.5–5.1)
Sodium: 132 mmol/L — ABNORMAL LOW (ref 135–145)
Total Bilirubin: 0.7 mg/dL (ref 0.3–1.2)
Total Protein: 7.7 g/dL (ref 6.5–8.1)

## 2019-07-16 LAB — GLUCOSE 6 PHOSPHATE DEHYDROGENASE
G6PDH: 18.7 U/g{Hb} — ABNORMAL HIGH (ref 4.8–15.7)
Hemoglobin: 10.3 g/dL — ABNORMAL LOW (ref 13.0–17.7)

## 2019-07-16 MED ORDER — ENSURE ENLIVE PO LIQD
237.0000 mL | Freq: Three times a day (TID) | ORAL | Status: DC
  Administered 2019-07-16 – 2019-07-17 (×5): 237 mL via ORAL

## 2019-07-16 MED ORDER — SODIUM POLYSTYRENE SULFONATE 15 GM/60ML PO SUSP
30.0000 g | Freq: Once | ORAL | Status: AC
  Administered 2019-07-16: 30 g via ORAL
  Filled 2019-07-16: qty 120

## 2019-07-16 MED ORDER — PRIMAQUINE PHOSPHATE 26.3 MG PO TABS
30.0000 mg | ORAL_TABLET | Freq: Every day | ORAL | Status: DC
  Administered 2019-07-16 – 2019-07-17 (×2): 30 mg via ORAL
  Filled 2019-07-16 (×4): qty 2

## 2019-07-16 NOTE — Progress Notes (Signed)
Nutrition Follow-up  DOCUMENTATION CODES:   Non-severe (moderate) malnutrition in context of chronic illness  INTERVENTION:  - will increase Ensure Enlive from BID to TID. - continue 30 ml prostat BID. - continue to encourage PO intakes.  - if PO intakes remain poor, small bore NGT and initiation of TF to meet 75% of estimated needs. - TF goal: Osmolite 1.2 @ 60 ml/hr to provide 1728 kcal, 80 grams protein, and 1181 ml free water.    NUTRITION DIAGNOSIS:   Moderate Malnutrition related to chronic illness, cancer and cancer related treatments as evidenced by mild fat depletion, mild muscle depletion, percent weight loss. -ongoing  GOAL:   Patient will meet greater than or equal to 90% of their needs -unmet  MONITOR:   PO intake, Supplement acceptance, Labs, Weight trends  ASSESSMENT:   64 year-old male with a history of Hodgkin's lymphoma (stage 3, diagnosed 02/2019) on chemotherapy. He follows with Dr. Maylon Peppers in Oncology. His most recent treatment was on 3/8. He presented to the ED with worsening respiratory status and inability to tolerate PO intake recently. He had his chemo delayed this week. He has a history of neutropenic fever in the past. He had a good response to chemo on his first follow up PET scan. His appetite has been poor and he has been receiving IV fluids.  Per flow sheet, he recently consumed the following at meals: 3/30- 40% of breakfast 3/31- 40% of breakfast, 40% of lunch, 25% of dinner 4/4- 50% of dinner  Per review of orders, he has been accepting Ensure 100% of the time offered and prostat 90% of the time offered. Patient was resting supine in bed with HFNC and NRB mask both on. Patient had a difficult time hearing over the sound of the oxygen flow. He indicates that for breakfast he did not have any solid food, only liquids. He reports that he has been drinking all bottles of Ensure given to him and that he enjoys this supplement.  Family member was at  bedside and was on the phone throughout RD visit. She reported that patient only ate a few bites of beans and of corn bread that she brought in. Patient stated he planned to eat more of these later this afternoon.   Ongoing encouragement and conversations regarding the importance of nutrition. Family member has been active in encouragement.   Weight today is 150 lb and weight on 3/30 was 164 lb. This indicates 14 lb weight loss (8.5% body weight) in the past 1 week.   Per notes: - acute hypoxemic respiratory failure - prone positioning while in bed - hyperkalemia--improving - chronic anemia - oral thrush--improving, continues to cough up thick mucus - Hodgkin's lymphoma - physical deconditioning - protein calorie malnutrition - possible tube feeding if PO intakes remain poor    Labs reviewed; CBGs: 135 and 84 mg/dl, Na: 132 mmol/l, K: 5.9 mmol/l, Cl: 97 mmol/l, BUN: 39 mg/dl. Medications reviewed; 325 mg ferrous sulfate/day, sliding scale novolog, 5 mg novolog TID, 8 units lantus BID, 60 mg solu-medrol BID, daily multivitamin with minerals, 30 g kayexalate x1 dose 4/5 and x1 dose 4/6.   Diet Order:   Diet Order            Diet regular Room service appropriate? Yes; Fluid consistency: Thin  Diet effective now              EDUCATION NEEDS:   No education needs have been identified at this time  Skin:  Skin  Assessment: Reviewed RN Assessment  Last BM:  4/6  Height:   Ht Readings from Last 1 Encounters:  07/08/19 5\' 6"  (1.676 m)    Weight:   Wt Readings from Last 1 Encounters:  07/16/19 68.2 kg    Ideal Body Weight:  64.5 kg  BMI:  Body mass index is 24.27 kg/m.  Estimated Nutritional Needs:   Kcal:  8257-4935 kcal  Protein:  110-120 grams  Fluid:  >/= 2.2 L/day    Jarome Matin, MS, RD, LDN, CNSC Inpatient Clinical Dietitian RD pager # available in [facility]  After hours/weekend pager # available in [facility]

## 2019-07-16 NOTE — Progress Notes (Signed)
NAME:  Steve [name], MRN:  [id number], DOB:  03-Jun-1955, LOS: 8 ADMISSION DATE:  07/08/2019, CONSULTATION DATE:  3/29  REFERRING MD:  [name], CHIEF COMPLAINT:  dyspnea   Brief History   64 y/o male admitted with severe acute respiratory failure with hypoxemia in the setting of receiving chemotherapy for Hodgkin's lymphoma.    Past Medical History  Hodgkin's lymphoma- last chemo 3/8 Staph septic arthritis of hip arthroplasty bilaterally Covid 19 viral pneumonia > February 2021 DM OSA HTN Pancreatitis GERD  Significant Hospital Events   3/29 admission to ICU   Consults:  ID Oncology  Procedures:  Cefepime 3/29>> 3/31 vanc 3/29>> 3/30 Azithromycin 3/29>>4/1 Fluconazole 3/29>> Meropenem 3/31>> Bactrim 3/31>>4/5  Clindamycin 4/5 >  Primaquine 4/5 >   Significant Diagnostic Tests:  CTA chest 3/29 > no PE, diffuse GGO upper lobes, patchy consolidation in bases, peripheral fibrotic changes noted on prior CT chest still present Echo 3/30> LVEF 60 to 20%, grade 1 diastolic dysfunction.  Mildly dilated LA, normal RV.  Trivial MR and TR, otherwise normal valves  Micro Data:  3/29 blood >  3/29 urine >  3/30 blasto antigen blood > pending 4/4 pneumocystis smear > negative 4/2 resp >    Antimicrobials:  Cefepime 3/29>> 3/31 vanc 3/29>> 3/30 Azithromycin 3/29>>4/1 Fluconazole 3/29>> Meropenem 3/31>> Bactrim 3/31>> Fungitel 3/29 > positive  Interim history/subjective:   Ate some yesterday Sat up in a chair yesterday  Objective   Blood pressure 111/60, pulse 77, temperature 97.6 F (36.4 C), temperature source Axillary, resp. rate 18, height 5\' 6"  (1.676 m), weight 68.2 kg, SpO2 (!) 88 %.    FiO2 (%):  [100 %] 100 %   Intake/Output Summary (Last 24 hours) at 07/16/2019 0742 Last data filed at 07/16/2019 0400 Gross per 24 hour  Intake 487.24 ml  Output 750 ml  Net -262.76 ml   Filed Weights   07/13/19 0500 07/14/19 0500 07/16/19 0500  Weight: 68.8 kg 68.5 kg  68.2 kg    Examination:  General:  Chronically ill appearing, dyspnea in bed HENT: NCAT OP clear PULM: Crackles bases B, normal effort CV: RRR, no mgr GI: BS+, soft, nontender MSK: normal bulk and tone Neuro: awake, alert, no distress, MAEW  4/6 CXR images personally reviewed> severe bilateral airspace disease, pneumomediastinum  Resolved Hospital Problem list     Assessment & Plan:  Acute hypoxemic respiratory failure: unclear etiology, could very well be infectious pneumonia or drug effect (brentuximab).  Brentuximab carries 5% risk of pulmonary toxicity which occurs weeks into administration, treated with steroids and withholding agent.  PJP stain negative 4/5 4/6 no major changes, still eating some Continue solumedrol, would favor maintaining same dose for now Discuss PJP stain result with ID Continuing broad spectrum antibiotics for now but will discuss narrowing with  Bronchoscopy if intubated Up in chair Maintain in ICU on HHF + NRB Tolerate periods of hypoxemia, goal at rest is greater than 85% SaO2, with movement ideally above 75% Decision for intubation should be based on a change in mental status or physical evidence of ventilatory failure such as nasal flaring, accessory muscle use, paradoxical breathing Out of bed to chair as able Incentive spirometry is important, use every hour Prone positioning while in bed  Hyperkalemia> improved Monitor BMET and UOP Replace electrolytes as needed  DM2 SSI + glargine  Chronic anemia Monitor for bleeding Transfuse PRBC for Hgb < 7 gm/dL  Oral thrush> improved but still complains of thick coating in mouth and coughing  up thick mucus Mouth care per routine  Hodgkin's lymphoma Per oncology  HLD Statin  Severe protein calorie malnutrition Dietary consult for calorie count, increase caloric intake as much as possible  Physical deconditioning Out of bed today  Best practice:  Diet: regular diet, will need tube  feeding if can't eat today Pain/Anxiety/Delirium protocol (if indicated): n/a VAP protocol (if indicated): n/a DVT prophylaxis: lovenox GI prophylaxis: n/a Glucose control: SSI + glargine Mobility: bed rest Code Status: full, confirmed again on 4/4 Family Communication: Updated wife by phone on 4/6 Disposition: remain in ICU  Labs   CBC: Recent Labs  Lab 07/12/19 0408 07/13/19 0321 07/14/19 0458 07/15/19 0332 07/16/19 0112  WBC 24.1* 21.3* 22.0* 19.1* 18.8*  NEUTROABS  --   --   --  16.6* 15.9*  HGB 8.3* 8.2* 8.9* 9.3* 9.8*  HCT 26.3* 26.3* 28.3* 29.7* 31.6*  MCV 92.9 92.9 92.2 93.7 92.4  PLT 221 248 302 318 347    Basic Metabolic Panel: Recent Labs  Lab 07/12/19 0408 07/12/19 0408 07/13/19 0321 07/13/19 0321 07/14/19 0458 07/14/19 0458 07/15/19 0332 07/15/19 0602 07/15/19 0830 07/15/19 1300 07/15/19 1600 07/15/19 2027 07/16/19 0112  NA 136  --  133*  --  131*  --  129*  --   --   --   --   --  132*  K 4.1   < > 5.1   < > 5.2*   < > 6.2*   < > 5.8* 5.7* 5.7* 6.3* 5.9*  5.9*  CL 99  --  97*  --  96*  --  95*  --   --   --   --   --  97*  CO2 27  --  28  --  26  --  26  --   --   --   --   --  26  GLUCOSE 217*  --  246*  --  174*  --  226*  --   --   --   --   --  161*  BUN 20  --  27*  --  31*  --  32*  --   --   --   --   --  39*  CREATININE 0.78  --  0.82  --  0.89  --  0.91  --   --   --   --   --  0.97  CALCIUM 8.1*  --  8.2*  --  8.5*  --  8.4*  --   --   --   --   --  9.1  MG 2.0  --   --   --  2.1  --   --   --   --   --   --   --   --    < > = values in this interval not displayed.   GFR: Estimated Creatinine Clearance: 70.3 mL/min (by C-G formula based on SCr of 0.97 mg/dL). Recent Labs  Lab 07/10/19 0605 07/11/19 0212 07/13/19 0321 07/14/19 0458 07/15/19 0332 07/16/19 0112  PROCALCITON 1.84  --   --   --   --   --   WBC 31.6*   < > 21.3* 22.0* 19.1* 18.8*   < > = values in this interval not displayed.    Liver Function Tests: Recent  Labs  Lab 07/15/19 0332 07/16/19 0112  AST 18 37  ALT 17 33  ALKPHOS 70 94  BILITOT 0.4 0.7  PROT 7.1 7.7  ALBUMIN 2.2* 2.5*   No results for input(s): LIPASE, AMYLASE in the last 168 hours. No results for input(s): AMMONIA in the last 168 hours.  ABG    Component Value Date/Time   PHART 7.473 (H) 07/08/2019 1245   PCO2ART 37.1 07/08/2019 1245   PO2ART 64.5 (L) 07/08/2019 1245   HCO3 26.8 07/08/2019 1245   TCO2 28 07/08/2019 1331   O2SAT 91.2 07/08/2019 1245     Coagulation Profile: No results for input(s): INR, PROTIME in the last 168 hours.  Cardiac Enzymes: No results for input(s): CKTOTAL, CKMB, CKMBINDEX, TROPONINI in the last 168 hours.  HbA1C: Hgb A1c MFr Bld  Date/Time Value Ref Range Status  07/09/2019 05:10 AM 7.3 (H) 4.8 - 5.6 % Final    Comment:    (NOTE) Pre diabetes:          5.7%-6.4% Diabetes:              >6.4% Glycemic control for   <7.0% adults with diabetes   05/19/2019 03:56 AM 8.3 (H) 4.8 - 5.6 % Final    Comment:    (NOTE) Pre diabetes:          5.7%-6.4% Diabetes:              >6.4% Glycemic control for   <7.0% adults with diabetes     CBG: Recent Labs  Lab 07/15/19 1109 07/15/19 1614 07/15/19 1954 07/15/19 2321 07/16/19 0330  GLUCAP 106* 243* 128* 140* 135*       Critical care time: 35 minutes    [name], MD [place] [facility] Pager: [phone] Cell: [phone] If no response, call [phone]

## 2019-07-16 NOTE — Progress Notes (Signed)
eLink Physician-Brief Progress Note Patient Name: Steve [name] DOB: 28-Nov-1955 MRN: [id number]   Date of Service  07/16/2019  HPI/Events of Note  K+ = 5.9   eICU Interventions  Will order: 1. Kayexalate 30 gm PO now. 2. Continue to trend K+.      Intervention Category Major Interventions: Electrolyte abnormality - evaluation and management  [name] 07/16/2019, 3:52 AM

## 2019-07-16 NOTE — Progress Notes (Signed)
Pharmacy Antibiotic Note  Steve [name] is a 64 y.o. male currently on cefepime, primaquin and clindamycin for PNA.  - D7 Meropenem 1g IV q8h - D9 Fluconazole 200 mg x1 then 100 mg daily for thrush  Day #5/21 txment of PCP - bactrim d/ced d/t hyperkalemia - D2 primaquin 30mg  daily (QTc <500) - D2 clinda 600 mg IV q6h  Today, 07/16/2019: - afeb, wbc elevated but down (on steroid) - scr ok (crcl~70) - 4/2 pneumocystitis smear: negative - 4/5 GP6: pending  Plan: - meropenem 1gm q8h -  primaquin 30mg  daily and clindamycin 600 mg IV q6h per ID - f/u with ID's recom. for abx ____________________________________  Height: 5\' 6"  (167.6 cm) Weight: 68.2 kg (150 lb 5.7 oz) IBW/kg (Calculated) : 63.8  Temp (24hrs), Avg:97.1 F (36.2 C), Min:96.8 F (36 C), Max:97.6 F (36.4 C)  Recent Labs  Lab 07/12/19 0408 07/13/19 0321 07/14/19 0458 07/15/19 0332 07/16/19 0112  WBC 24.1* 21.3* 22.0* 19.1* 18.8*  CREATININE 0.78 0.82 0.89 0.91 0.97    Estimated Creatinine Clearance: 70.3 mL/min (by C-G formula based on SCr of 0.97 mg/dL).    No Known Allergies  Antimicrobials this admission: 3/29  cefepime >> 3/31 3/29 vancomycin >> 3/31 3/29  Azithromycin >> 4/2 3/29  Fluconazole >> 3/31 Septra  >> 4/5 (d/ced d/t elevated K) 3/31  meropenem >>  4/5 Clinda (PCP)>> 4/5 Primaquine (PCP)>>   Dose adjustments this admission: --  Microbiology results: 3/29 BCx x2: neg FINAL 3/29 UCx: NGF  3/29 MRSA PCR: neg 3/30 Blastomyces Ag: pending  3/29 aspergillus antb: neg 3/29 cryptococcal antigen: neg 3/29 Legionella Ur Ag: negative 3/29 Histoplasma Ag: neg 4/2 pneumocystitis smear: negative 4/5 GP6:   4/4 sputum:   Thank you for allowing pharmacy to be a part of this patient's care.  Lynelle Doctor 07/16/2019 9:59 AM

## 2019-07-16 NOTE — Progress Notes (Signed)
Subjective:  No new complaints   Antibiotics:  Anti-infectives (From admission, onward)   Start     Dose/Rate Route Frequency Ordered Stop   07/16/19 1000  primaquine tablet 30 mg     30 mg Oral Daily 07/16/19 0121     07/15/19 1200  clindamycin (CLEOCIN) IVPB 600 mg     600 mg 100 mL/hr over 30 Minutes Intravenous Every 6 hours 07/15/19 1013     07/15/19 1100  primaquine tablet 30 mg  Status:  Discontinued     30 mg Oral Daily 07/15/19 1013 07/16/19 0120   07/12/19 1000  fluconazole (DIFLUCAN) IVPB 100 mg     100 mg 50 mL/hr over 60 Minutes Intravenous Every 24 hours 07/12/19 0929     07/10/19 1800  meropenem (MERREM) 1 g in sodium chloride 0.9 % 100 mL IVPB     1 g 200 mL/hr over 30 Minutes Intravenous Every 8 hours 07/10/19 1425 07/17/19 0959   07/10/19 0900  sulfamethoxazole-trimethoprim (BACTRIM) 450 mg in dextrose 5 % 500 mL IVPB  Status:  Discontinued     450 mg 352.1 mL/hr over 90 Minutes Intravenous Every 8 hours 07/10/19 0813 07/15/19 1013   07/09/19 1000  fluconazole (DIFLUCAN) tablet 100 mg  Status:  Discontinued     100 mg Oral Daily 07/08/19 1919 07/12/19 0929   07/09/19 0200  vancomycin (VANCOCIN) IVPB 1000 mg/200 mL premix  Status:  Discontinued     1,000 mg 200 mL/hr over 60 Minutes Intravenous Every 12 hours 07/08/19 1930 07/10/19 1017   07/08/19 2200  ceFEPIme (MAXIPIME) 2 g in sodium chloride 0.9 % 100 mL IVPB  Status:  Discontinued     2 g 200 mL/hr over 30 Minutes Intravenous Every 8 hours 07/08/19 1929 07/10/19 1425   07/08/19 2000  fluconazole (DIFLUCAN) tablet 200 mg     200 mg Oral  Once 07/08/19 1919 07/08/19 2005   07/08/19 1800  azithromycin (ZITHROMAX) 500 mg in sodium chloride 0.9 % 250 mL IVPB  Status:  Discontinued     500 mg 250 mL/hr over 60 Minutes Intravenous Every 24 hours 07/08/19 1736 07/12/19 0708   07/08/19 1345  ceFEPIme (MAXIPIME) 2 g in sodium chloride 0.9 % 100 mL IVPB     2 g 200 mL/hr over 30 Minutes Intravenous STAT  07/08/19 1343 07/08/19 1631   07/08/19 1345  vancomycin (VANCOREADY) IVPB 1500 mg/300 mL     1,500 mg 150 mL/hr over 120 Minutes Intravenous STAT 07/08/19 1343 07/08/19 1631      Medications: Scheduled Meds: . atorvastatin  10 mg Oral Daily  . Chlorhexidine Gluconate Cloth  6 each Topical Daily  . enoxaparin (LOVENOX) injection  40 mg Subcutaneous Q24H  . feeding supplement (ENSURE ENLIVE)  237 mL Oral TID BM  . feeding supplement (PRO-STAT SUGAR FREE 64)  30 mL Oral BID  . ferrous sulfate  325 mg Oral Q breakfast  . insulin aspart  3-9 Units Subcutaneous Q4H  . insulin aspart  5 Units Subcutaneous TID WC  . insulin glargine  8 Units Subcutaneous BID  . ipratropium-albuterol  3 mL Nebulization Q4H  . mouth rinse  15 mL Mouth Rinse BID  . methylPREDNISolone (SOLU-MEDROL) injection  60 mg Intravenous BID  . multivitamin with minerals  2 tablet Oral Daily  . pantoprazole (PROTONIX) IV  40 mg Intravenous QHS  . primaquine  30 mg Oral Daily  . sodium chloride flush  10-40 mL Intracatheter  Q12H   Continuous Infusions: . clindamycin (CLEOCIN) IV Stopped (07/16/19 1332)  . fluconazole (DIFLUCAN) IV Stopped (07/16/19 1249)  . meropenem (MERREM) IV Stopped (07/16/19 1056)   PRN Meds:.acetaminophen, acetaminophen, lip balm, magic mouthwash w/lidocaine, melatonin, ondansetron (ZOFRAN) IV, sodium chloride    Objective: Weight change:   Intake/Output Summary (Last 24 hours) at 07/16/2019 1549 Last data filed at 07/16/2019 0400 Gross per 24 hour  Intake 300 ml  Output 750 ml  Net -450 ml   Blood pressure 135/86, pulse 91, temperature (!) 97 F (36.1 C), temperature source Axillary, resp. rate (!) 22, height _0  (1.676 m), weight 68.2 kg, SpO2 (!) 88 %. Temp:  [96.8 F (36 C)-97.6 F (36.4 C)] 97 F (36.1 C) (04/06 1200) Pulse Rate:  [75-107] 91 (04/06 1100) Resp:  [18-36] 22 (04/06 1100) BP: (111-135)/(60-100) 135/86 (04/06 1100) SpO2:  [75 %-98 %] 88 % (04/06 1137) FiO2 (%):   [100 %] 100 % (04/06 1137) Weight:  [68.2 kg] 68.2 kg (04/06 0500)  Physical Exam: General: Alert and awake, oriented x3,  HEENT: anicteric sclera, EOMI CVS tachycardic no mgr Chest: , Tachypneic but fairly clear anteriorly Abdomen: soft non-distended,  Extremities: no edema or deformity noted bilaterally Skin: no rashes Neuro: nonfocal  CBC:    BMET Recent Labs    07/15/19 0332 07/15/19 0602 07/16/19 0112 07/16/19 0112 07/16/19 0905 07/16/19 1305  NA 129*  --  132*  --   --   --   K 6.2*   < > 5.9*  5.9*   < > 4.8 5.1  CL 95*  --  97*  --   --   --   CO2 26  --  26  --   --   --   GLUCOSE 226*  --  161*  --   --   --   BUN 32*  --  39*  --   --   --   CREATININE 0.91  --  0.97  --   --   --   CALCIUM 8.4*  --  9.1  --   --   --    < > = values in this interval not displayed.     Liver Panel  Recent Labs    07/15/19 0332 07/16/19 0112  PROT 7.1 7.7  ALBUMIN 2.2* 2.5*  AST 18 37  ALT 17 33  ALKPHOS 70 94  BILITOT 0.4 0.7       Sedimentation Rate No results for input(s): ESRSEDRATE in the last 72 hours. C-Reactive Protein No results for input(s): CRP in the last 72 hours.  Micro Results: Recent Results (from the past 720 hour(s))  Blood Culture (routine x 2)     Status: None   Collection Time: 07/08/19  1:20 PM   Specimen: BLOOD  Result Value Ref Range Status   Specimen Description   Final    BLOOD RIGHT ANTECUBITAL Performed at [facility] [address] [place], [place] [address]    Special Requests   Final    BOTTLES DRAWN AEROBIC AND ANAEROBIC Blood Culture adequate volume Performed at [facility] [address] [place], [place] [address]    Culture   Final    NO GROWTH 5 DAYS Performed at [facility], [place] [address], [place], [place] [address]    Report Status 07/13/2019 FINAL  Final  Blood Culture (routine x 2)     Status: None   Collection Time: 07/08/19  1:25 PM   Specimen: BLOOD   Result Value Ref Range Status   Specimen Description   Final    BLOOD RIGHT ANTECUBITAL Performed at [facility] [address] [place], [place] [address]    Special Requests   Final    BOTTLES DRAWN AEROBIC AND ANAEROBIC Blood Culture adequate volume Performed at [facility] [address], [place], [place] [address]    Culture   Final    NO GROWTH 5 DAYS Performed at [facility], [place] [address], [place], [place] [address]    Report Status 07/13/2019 FINAL  Final  Urine culture     Status: None   Collection Time: 07/08/19  6:30 PM   Specimen: In/Out Cath Urine  Result Value Ref Range Status   Specimen Description   Final    IN/OUT CATH URINE Performed at [facility] [address] [place], [place] [address]    Special Requests   Final    NONE Performed at [facility], [place] [address], [place], [place] [address]    Culture   Final    NO GROWTH Performed at [facility], [place] [address] [place], [place] [address]    Report Status 07/09/2019 FINAL  Final  MRSA PCR Screening     Status: None   Collection Time: 07/08/19  9:29 PM   Specimen: Nasopharyngeal  Result Value Ref Range Status   MRSA by PCR NEGATIVE NEGATIVE Final    Comment:        The GeneXpert MRSA Assay (FDA approved for NASAL specimens only), is one component of a comprehensive MRSA colonization surveillance program. It is not intended to diagnose MRSA infection nor to guide or monitor treatment for MRSA infections. Performed at [facility], [place] [address] [place], [place] [address]   Blastomyces Antigen     Status: None   Collection Time: 07/09/19  6:33 PM   Specimen: Blood  Result Value Ref Range Status   Blastomyces Antigen None Detected None Detected ng/mL Final    Comment: (NOTE) Results reported as ng/mL in 0.2 - 14.7 ng/mL range Results above the limit of detection but below  0.2 ng/mL are reported as 'Positive, Below the Limit of Quantification' Results above 14.7 ng/mL are reported as 'Positive, Above the Limit of Quantification'    Specimen Type SERUM  Final    Comment: (NOTE) Performed At: [facility],  [id number] Noralyn Pick MD [id number]   Pneumocystis smear by DFA     Status: None   Collection Time: 07/12/19  2:26 PM   Specimen: Expectorated Sputum; Respiratory  Result Value Ref Range Status   Specimen Source-PJSRC EXPECTORATED SPUTUM  Final   Pneumocystis jiroveci Ag NEGATIVE  Final    Comment: Performed at [facility] Performed at [facility] [address] [place], [place] [address]   Expectorated sputum assessment w rflx to resp cult     Status: None   Collection Time: 07/14/19 11:56 AM   Specimen: Sputum  Result Value Ref Range Status   Specimen Description SPUTUM  Final   Special Requests Immunocompromised  Final   Sputum evaluation   Final    THIS SPECIMEN IS ACCEPTABLE FOR SPUTUM CULTURE Performed at [facility], [place] [address] [place], [place] [address]    Report Status 07/14/2019 FINAL  Final  Culture, respiratory  Status: None (Preliminary result)   Collection Time: 07/14/19 11:56 AM   Specimen: SPU  Result Value Ref Range Status   Specimen Description   Final    SPUTUM Performed at [facility] [address], [place], [place] [address]    Special Requests   Final    Immunocompromised Reflexed from [id number] Performed at [facility], [place] [address] [place], [place] [address]    Gram Stain NO WBC SEEN NO ORGANISMS SEEN   Final   Culture   Final    FEW STAPHYLOCOCCUS EPIDERMIDIS CULTURE REINCUBATED FOR BETTER GROWTH Performed at [facility], [place] [address] [place], [place] [address]    Report Status PENDING  Incomplete    Studies/Results: DG Chest Port 1  View  Result Date: 07/16/2019 CLINICAL DATA:  Respiratory failure EXAM: PORTABLE CHEST 1 VIEW COMPARISON:  Five days ago FINDINGS: New pneumomediastinum and chest wall emphysema that is moderately extensive. No convincing pneumothorax, paramediastinal left apical fine line is likely related to the pneumomediastinum. Borderline cardiomegaly. Extensive bilateral airspace disease with low lung volumes. Port with tip at the upper cavoatrial junction. The stomach is moderately distended. These results will be called to the ordering clinician or representative by the Radiologist Assistant, and communication documented in the PACS or [employer]. IMPRESSION: 1. New pneumomediastinum and bilateral chest wall emphysema. No definite pneumothorax but recommend short follow-up to re-evaluate the left apex. 2. Low volume chest with confluent airspace disease. 3. Moderate gaseous distension of the stomach. Electronically Signed   By: Monte Fantasia M.D.   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 male with Hodgkin's lymphoma on chemotherapy, with Covid pneumonia in February and supplemental oxygen requirements afterwards who presents with acute on chronic respiratory failure with bilateral diffuse pulmonary infiltrates.  Serum crypto ag, urine histo ag negative, legionella ag negative Fungitell is 231, galactomannan -, PCP DFA on sputum but on therapy is negative  1..Bilateral diffuse pulmonary infiltrates: Agree with continuing therapy with Bactrim and steroids., merrem  Despite the negative DFA for PCP it remains high in my  differential  And he was already on therapy long before it was sent  Continue   clindamycin and primaquine (G6PD negative)  He may very well require BAL at some point  I am DC merrem after next 2 doses  2 Thrush: on fluconazole     LOS: 8 days   Alcide Evener 07/16/2019, 3:49 PM

## 2019-07-17 DIAGNOSIS — J9620 Acute and chronic respiratory failure, unspecified whether with hypoxia or hypercapnia: Secondary | ICD-10-CM | POA: Diagnosis not present

## 2019-07-17 DIAGNOSIS — E1165 Type 2 diabetes mellitus with hyperglycemia: Secondary | ICD-10-CM | POA: Diagnosis not present

## 2019-07-17 DIAGNOSIS — R918 Other nonspecific abnormal finding of lung field: Secondary | ICD-10-CM | POA: Diagnosis not present

## 2019-07-17 DIAGNOSIS — B379 Candidiasis, unspecified: Secondary | ICD-10-CM | POA: Diagnosis not present

## 2019-07-17 DIAGNOSIS — J9621 Acute and chronic respiratory failure with hypoxia: Secondary | ICD-10-CM | POA: Diagnosis not present

## 2019-07-17 DIAGNOSIS — J189 Pneumonia, unspecified organism: Secondary | ICD-10-CM | POA: Diagnosis not present

## 2019-07-17 DIAGNOSIS — C8190 Hodgkin lymphoma, unspecified, unspecified site: Secondary | ICD-10-CM | POA: Diagnosis not present

## 2019-07-17 LAB — CBC WITH DIFFERENTIAL/PLATELET
Abs Immature Granulocytes: 0.11 10*3/uL — ABNORMAL HIGH (ref 0.00–0.07)
Basophils Absolute: 0 10*3/uL (ref 0.0–0.1)
Basophils Relative: 0 %
Eosinophils Absolute: 0 10*3/uL (ref 0.0–0.5)
Eosinophils Relative: 0 %
HCT: 31.2 % — ABNORMAL LOW (ref 39.0–52.0)
Hemoglobin: 9.6 g/dL — ABNORMAL LOW (ref 13.0–17.0)
Immature Granulocytes: 1 %
Lymphocytes Relative: 8 %
Lymphs Abs: 1.1 10*3/uL (ref 0.7–4.0)
MCH: 29.4 pg (ref 26.0–34.0)
MCHC: 30.8 g/dL (ref 30.0–36.0)
MCV: 95.4 fL (ref 80.0–100.0)
Monocytes Absolute: 1.8 10*3/uL — ABNORMAL HIGH (ref 0.1–1.0)
Monocytes Relative: 12 %
Neutro Abs: 11.8 10*3/uL — ABNORMAL HIGH (ref 1.7–7.7)
Neutrophils Relative %: 79 %
Platelets: 311 10*3/uL (ref 150–400)
RBC: 3.27 MIL/uL — ABNORMAL LOW (ref 4.22–5.81)
RDW: 20 % — ABNORMAL HIGH (ref 11.5–15.5)
WBC: 14.8 10*3/uL — ABNORMAL HIGH (ref 4.0–10.5)
nRBC: 0 % (ref 0.0–0.2)

## 2019-07-17 LAB — GLUCOSE, CAPILLARY
Glucose-Capillary: 117 mg/dL — ABNORMAL HIGH (ref 70–99)
Glucose-Capillary: 97 mg/dL (ref 70–99)
Glucose-Capillary: 129 mg/dL — ABNORMAL HIGH (ref 70–99)
Glucose-Capillary: 188 mg/dL — ABNORMAL HIGH (ref 70–99)
Glucose-Capillary: 152 mg/dL — ABNORMAL HIGH (ref 70–99)
Glucose-Capillary: 210 mg/dL — ABNORMAL HIGH (ref 70–99)

## 2019-07-17 LAB — COMPREHENSIVE METABOLIC PANEL
ALT: 36 U/L (ref 0–44)
AST: 27 U/L (ref 15–41)
Albumin: 2.5 g/dL — ABNORMAL LOW (ref 3.5–5.0)
Alkaline Phosphatase: 99 U/L (ref 38–126)
Anion gap: 7 (ref 5–15)
BUN: 40 mg/dL — ABNORMAL HIGH (ref 8–23)
CO2: 31 mmol/L (ref 22–32)
Calcium: 9 mg/dL (ref 8.9–10.3)
Chloride: 100 mmol/L (ref 98–111)
Creatinine, Ser: 0.66 mg/dL (ref 0.61–1.24)
GFR calc Af Amer: >60 mL/min (ref >60)
GFR calc non Af Amer: >60 mL/min (ref >60)
Glucose, Bld: 123 mg/dL — ABNORMAL HIGH (ref 70–99)
Potassium: 5 mmol/L (ref 3.5–5.1)
Sodium: 138 mmol/L (ref 135–145)
Total Bilirubin: 0.7 mg/dL (ref 0.3–1.2)
Total Protein: 7.3 g/dL (ref 6.5–8.1)

## 2019-07-17 MED ORDER — DRONABINOL 5 MG PO CAPS
5.0000 mg | ORAL_CAPSULE | Freq: Two times a day (BID) | ORAL | Status: DC
  Administered 2019-07-17 (×2): 5 mg via ORAL
  Filled 2019-07-17 (×2): qty 1

## 2019-07-17 MED ORDER — HYDRALAZINE HCL 25 MG PO TABS
25.0000 mg | ORAL_TABLET | Freq: Four times a day (QID) | ORAL | Status: DC | PRN
  Administered 2019-07-17: 21:00:00 25 mg via ORAL
  Filled 2019-07-17: qty 1

## 2019-07-17 NOTE — Progress Notes (Signed)
NAME:  Steve [name], MRN:  [id number], DOB:  August 02, 1955, LOS: 9 ADMISSION DATE:  07/08/2019, CONSULTATION DATE:  3/29  REFERRING MD:  [name], CHIEF COMPLAINT:  dyspnea   Brief History   64 y/o male admitted with severe acute respiratory failure with hypoxemia in the setting of receiving chemotherapy for Hodgkin's lymphoma.    Past Medical History  Hodgkin's lymphoma- last chemo 3/8 Staph septic arthritis of hip arthroplasty bilaterally Covid 19 viral pneumonia > February 2021 DM OSA HTN Pancreatitis GERD  Significant Hospital Events   3/29 Admission to ICU 4/07 Remains on 100%, 60L flow  Consults:  ID Oncology  Procedures:  Cefepime 3/29>> 3/31 vanc 3/29>> 3/30 Azithromycin 3/29>>4/1 Fluconazole 3/29>> Meropenem 3/31>> Bactrim 3/31>>4/5  Clindamycin 4/5 >  Primaquine 4/5 >   Significant Diagnostic Tests:   CTA chest 3/29 > no PE, diffuse GGO upper lobes, patchy consolidation in bases, peripheral fibrotic changes noted on prior CT chest still present  ECHO 3/30 > LVEF 60 to 09%, grade 1 diastolic dysfunction.  Mildly dilated LA, normal RV. Trivial MR and TR, otherwise normal valves  Micro Data:  UC 3/29 >> negative  BCx2 3/29 >> negative  PJP 4/2 >> negative  Fungitel 3/29 >> positive Blastomyces Antigen 3/20 >> negative Respiratory Culture 4/4 >> few staph epidermis >>  Antimicrobials:  Cefepime 3/29>> 3/31 Vanc 3/29>> 3/30 Azithromycin 3/29>>4/1 Fluconazole 3/29>> Meropenem 3/31>> Bactrim 3/31>>  Interim history/subjective:  Up in chair x4 hours this am, now back in bed "worn out".  Wife at bedside.  Afebrile  Remains on 60L / 100%  Objective   Blood pressure (!) 146/75, pulse 97, temperature (!) 97.5 F (36.4 C), temperature source Axillary, resp. rate (!) 30, height 5\' 6"  (1.676 m), weight 67.9 kg, SpO2 (!) 85 %.    FiO2 (%):  [95 %-100 %] 100 %   Intake/Output Summary (Last 24 hours) at 07/17/2019 1048 Last data filed at 07/17/2019 0630 Gross  per 24 hour  Intake 539.99 ml  Output 1525 ml  Net -985.01 ml   Filed Weights   07/14/19 0500 07/16/19 0500 07/17/19 0225  Weight: 68.5 kg 68.2 kg 67.9 kg    Examination: General: chronically ill appearing adult male lying in bed in NAD   HEENT: MM pink/moist, [place] O2 + NRB mask in place Neuro: Awakens, alert / oriented, generalized weakness  CV: s1s2 RRR, no m/r/g PULM: non-labored, lungs bilaterally with ambient noise from HFNC, crackles at bases GI: soft, bsx4 active  Extremities: warm/dry, no edema  Skin: no rashes or lesions  Resolved Hospital Problem list     Assessment & Plan:   Acute hypoxemic respiratory failure:  Patient post COVID, could very well be infectious pneumonia or drug effect (brentuximab).  Brentuximab carries 5% risk of pulmonary toxicity which occurs weeks into administration, treated with steroids and with holding agent. PJP stain negative 4/5.  Serum fungitel positive. Serum crypto, urine histo, legionella negative.  -ID concerns for PJP remain high as patient was on therapy prior to assessment  -wean O2 for sats >85% at rest, movement will tolerate as low as 75%.   -push pulmonary hygiene -IS, mobilize OOB BID -prone positioning when in bed -continue abx per ID -continue solumedrol at current dose  -if intubated, would need FOB.  Decision for intubation should be based on mental status change or evidence of respiratory distress / impending failure (paradoxical breathing, accessory muscle use)   Hyperkalemia -follow electrolytes and replace as indicated   DM2 -SSI,  resistant scale  -lantus 8 units BID + novolog 5 units TID with meals   Chronic anemia -follow CBC, monitor for bleeding  -transfuse for Hgb <7%  Oral Thrush -continue fluconazole  Hodgkin's Lymphoma -per Oncology  HLD -continue lipitor   Severe Protein Calorie Malnutrition -appreciate Nutrition input > rec's to increase ensure to TID, prostat and increase PO intake  -add  calorie count -add robinul for appetite   Physical deconditioning -PT efforts, mobilize  Best practice:  Diet: regular diet + ensure Pain/Anxiety/Delirium protocol (if indicated): n/a VAP protocol (if indicated): n/a DVT prophylaxis: lovenox GI prophylaxis: n/a Glucose control: SSI + glargine Mobility: bed rest Code Status: full, confirmed again on 4/4 Family Communication: Wife updated at bedside 4/7  Disposition: ICU  Labs   CBC: Recent Labs  Lab 07/13/19 0321 07/13/19 0321 07/14/19 0458 07/15/19 0332 07/15/19 1021 07/16/19 0112 07/17/19 0355  WBC 21.3*  --  22.0* 19.1*  --  18.8* 14.8*  NEUTROABS  --   --   --  16.6*  --  15.9* 11.8*  HGB 8.2*   < > 8.9* 9.3* 10.3* 9.8* 9.6*  HCT 26.3*  --  28.3* 29.7*  --  31.6* 31.2*  MCV 92.9  --  92.2 93.7  --  92.4 95.4  PLT 248  --  302 318  --  359 311   < > = values in this interval not displayed.    Basic Metabolic Panel: Recent Labs  Lab 07/12/19 0408 07/12/19 0408 07/13/19 0321 07/13/19 0321 07/14/19 2119 07/14/19 4174 07/15/19 0814 07/15/19 0602 07/16/19 0112 07/16/19 0905 07/16/19 1305 07/16/19 1810 07/17/19 0355  NA 136   < > 133*  --  131*  --  129*  --  132*  --   --   --  138  K 4.1   < > 5.1   < > 5.2*   < > 6.2*   < > 5.9*  5.9* 4.8 5.1 4.6 5.0  CL 99   < > 97*  --  96*  --  95*  --  97*  --   --   --  100  CO2 27   < > 28  --  26  --  26  --  26  --   --   --  31  GLUCOSE 217*   < > 246*  --  174*  --  226*  --  161*  --   --   --  123*  BUN 20   < > 27*  --  31*  --  32*  --  39*  --   --   --  40*  CREATININE 0.78   < > 0.82  --  0.89  --  0.91  --  0.97  --   --   --  0.66  CALCIUM 8.1*   < > 8.2*  --  8.5*  --  8.4*  --  9.1  --   --   --  9.0  MG 2.0  --   --   --  2.1  --   --   --   --   --   --   --   --    < > = values in this interval not displayed.   GFR: Estimated Creatinine Clearance: 85.3 mL/min (by C-G formula based on SCr of 0.66 mg/dL). Recent Labs  Lab 07/14/19 0458  07/15/19 0332 07/16/19 0112 07/17/19 0355  WBC 22.0*  19.1* 18.8* 14.8*    Liver Function Tests: Recent Labs  Lab 07/15/19 0332 07/16/19 0112 07/17/19 0355  AST 18 37 27  ALT 17 33 36  ALKPHOS 70 94 99  BILITOT 0.4 0.7 0.7  PROT 7.1 7.7 7.3  ALBUMIN 2.2* 2.5* 2.5*   No results for input(s): LIPASE, AMYLASE in the last 168 hours. No results for input(s): AMMONIA in the last 168 hours.  ABG    Component Value Date/Time   PHART 7.473 (H) 07/08/2019 1245   PCO2ART 37.1 07/08/2019 1245   PO2ART 64.5 (L) 07/08/2019 1245   HCO3 26.8 07/08/2019 1245   TCO2 28 07/08/2019 1331   O2SAT 91.2 07/08/2019 1245     Coagulation Profile: No results for input(s): INR, PROTIME in the last 168 hours.  Cardiac Enzymes: No results for input(s): CKTOTAL, CKMB, CKMBINDEX, TROPONINI in the last 168 hours.  HbA1C: Hgb A1c MFr Bld  Date/Time Value Ref Range Status  07/09/2019 05:10 AM 7.3 (H) 4.8 - 5.6 % Final    Comment:    (NOTE) Pre diabetes:          5.7%-6.4% Diabetes:              >6.4% Glycemic control for   <7.0% adults with diabetes   05/19/2019 03:56 AM 8.3 (H) 4.8 - 5.6 % Final    Comment:    (NOTE) Pre diabetes:          5.7%-6.4% Diabetes:              >6.4% Glycemic control for   <7.0% adults with diabetes     CBG: Recent Labs  Lab 07/16/19 1654 07/16/19 1944 07/16/19 2332 07/17/19 0331 07/17/19 0729  GLUCAP 202* 107* 184* 117* 97       Critical care time: 30 minutes    Noe Gens, MSN, NP-C [facility] 07/17/2019, 10:48 AM   Please see [email or web address] for pager details.

## 2019-07-17 NOTE — Progress Notes (Signed)
Called [facility] and spoke with Jeannie Done, RN and discussed blood pressure of 164/95.  [facility] RN to pass on to Dr. Oletta Darter.

## 2019-07-17 NOTE — Progress Notes (Signed)
Subjective:  "I can't breathe"   Antibiotics:  Anti-infectives (From admission, onward)   Start     Dose/Rate Route Frequency Ordered Stop   07/16/19 1000  primaquine tablet 30 mg     30 mg Oral Daily 07/16/19 0121     07/15/19 1200  clindamycin (CLEOCIN) IVPB 600 mg     600 mg 100 mL/hr over 30 Minutes Intravenous Every 6 hours 07/15/19 1013     07/15/19 1100  primaquine tablet 30 mg  Status:  Discontinued     30 mg Oral Daily 07/15/19 1013 07/16/19 0120   07/12/19 1000  fluconazole (DIFLUCAN) IVPB 100 mg     100 mg 50 mL/hr over 60 Minutes Intravenous Every 24 hours 07/12/19 0929     07/10/19 1800  meropenem (MERREM) 1 g in sodium chloride 0.9 % 100 mL IVPB     1 g 200 mL/hr over 30 Minutes Intravenous Every 8 hours 07/10/19 1425 07/17/19 0254   07/10/19 0900  sulfamethoxazole-trimethoprim (BACTRIM) 450 mg in dextrose 5 % 500 mL IVPB  Status:  Discontinued     450 mg 352.1 mL/hr over 90 Minutes Intravenous Every 8 hours 07/10/19 0813 07/15/19 1013   07/09/19 1000  fluconazole (DIFLUCAN) tablet 100 mg  Status:  Discontinued     100 mg Oral Daily 07/08/19 1919 07/12/19 0929   07/09/19 0200  vancomycin (VANCOCIN) IVPB 1000 mg/200 mL premix  Status:  Discontinued     1,000 mg 200 mL/hr over 60 Minutes Intravenous Every 12 hours 07/08/19 1930 07/10/19 1017   07/08/19 2200  ceFEPIme (MAXIPIME) 2 g in sodium chloride 0.9 % 100 mL IVPB  Status:  Discontinued     2 g 200 mL/hr over 30 Minutes Intravenous Every 8 hours 07/08/19 1929 07/10/19 1425   07/08/19 2000  fluconazole (DIFLUCAN) tablet 200 mg     200 mg Oral  Once 07/08/19 1919 07/08/19 2005   07/08/19 1800  azithromycin (ZITHROMAX) 500 mg in sodium chloride 0.9 % 250 mL IVPB  Status:  Discontinued     500 mg 250 mL/hr over 60 Minutes Intravenous Every 24 hours 07/08/19 1736 07/12/19 0708   07/08/19 1345  ceFEPIme (MAXIPIME) 2 g in sodium chloride 0.9 % 100 mL IVPB     2 g 200 mL/hr over 30 Minutes Intravenous STAT  07/08/19 1343 07/08/19 1631   07/08/19 1345  vancomycin (VANCOREADY) IVPB 1500 mg/300 mL     1,500 mg 150 mL/hr over 120 Minutes Intravenous STAT 07/08/19 1343 07/08/19 1631      Medications: Scheduled Meds: . atorvastatin  10 mg Oral Daily  . Chlorhexidine Gluconate Cloth  6 each Topical Daily  . dronabinol  5 mg Oral BID AC  . enoxaparin (LOVENOX) injection  40 mg Subcutaneous Q24H  . feeding supplement (ENSURE ENLIVE)  237 mL Oral TID BM  . feeding supplement (PRO-STAT SUGAR FREE 64)  30 mL Oral BID  . ferrous sulfate  325 mg Oral Q breakfast  . insulin aspart  3-9 Units Subcutaneous Q4H  . insulin aspart  5 Units Subcutaneous TID WC  . insulin glargine  8 Units Subcutaneous BID  . ipratropium-albuterol  3 mL Nebulization Q4H  . mouth rinse  15 mL Mouth Rinse BID  . methylPREDNISolone (SOLU-MEDROL) injection  60 mg Intravenous BID  . multivitamin with minerals  2 tablet Oral Daily  . pantoprazole (PROTONIX) IV  40 mg Intravenous QHS  . primaquine  30 mg Oral Daily  .  sodium chloride flush  10-40 mL Intracatheter Q12H   Continuous Infusions: . clindamycin (CLEOCIN) IV 600 mg (07/17/19 1253)  . fluconazole (DIFLUCAN) IV Stopped (07/17/19 1253)   PRN Meds:.acetaminophen, acetaminophen, lip balm, magic mouthwash w/lidocaine, melatonin, ondansetron (ZOFRAN) IV, sodium chloride    Objective: Weight change: -0.3 kg  Intake/Output Summary (Last 24 hours) at 07/17/2019 1713 Last data filed at 07/17/2019 1253 Gross per 24 hour  Intake 589.99 ml  Output 1525 ml  Net -935.01 ml   Blood pressure (!) 141/124, pulse 99, temperature (!) 97.4 F (36.3 C), temperature source Axillary, resp. rate (!) 28, height _0  (1.676 m), weight 67.9 kg, SpO2 95 %. Temp:  [97 F (36.1 C)-97.5 F (36.4 C)] 97.4 F (36.3 C) (04/07 1558) Pulse Rate:  [71-115] 99 (04/07 1522) Resp:  [21-34] 28 (04/07 1522) BP: (109-163)/(59-124) 141/124 (04/07 1500) SpO2:  [83 %-98 %] 95 % (04/07 1522) FiO2 (%):   [90 %-100 %] 90 % (04/07 1522) Weight:  [67.9 kg] 67.9 kg (04/07 0225)  Physical Exam: General: Alert and awake, oriented x3,  HEENT: anicteric sclera, EOMI CVS tachycardic no mgr Chest: , Tachypneic but fairly clear anteriorly Abdomen: soft non-distended,  Extremities: no edema or deformity noted bilaterally Skin: no rashes Neuro: nonfocal  CBC:    BMET Recent Labs    07/16/19 0112 07/16/19 0905 07/16/19 1810 07/17/19 0355  NA 132*  --   --  138  K 5.9*  5.9*   < > 4.6 5.0  CL 97*  --   --  100  CO2 26  --   --  31  GLUCOSE 161*  --   --  123*  BUN 39*  --   --  40*  CREATININE 0.97  --   --  0.66  CALCIUM 9.1  --   --  9.0   < > = values in this interval not displayed.     Liver Panel  Recent Labs    07/16/19 0112 07/17/19 0355  PROT 7.7 7.3  ALBUMIN 2.5* 2.5*  AST 37 27  ALT 33 36  ALKPHOS 94 99  BILITOT 0.7 0.7       Sedimentation Rate No results for input(s): ESRSEDRATE in the last 72 hours. C-Reactive Protein No results for input(s): CRP in the last 72 hours.  Micro Results: Recent Results (from the past 720 hour(s))  Blood Culture (routine x 2)     Status: None   Collection Time: 07/08/19  1:20 PM   Specimen: BLOOD  Result Value Ref Range Status   Specimen Description   Final    BLOOD RIGHT ANTECUBITAL Performed at [facility] [address] [place], [place] [address]    Special Requests   Final    BOTTLES DRAWN AEROBIC AND ANAEROBIC Blood Culture adequate volume Performed at [facility] [address] [place], [place] [address]    Culture   Final    NO GROWTH 5 DAYS Performed at [facility], [place] [address], [place], [place] [address]    Report Status 07/13/2019 FINAL  Final  Blood Culture (routine x 2)     Status: None   Collection Time: 07/08/19  1:25 PM   Specimen: BLOOD  Result Value Ref Range Status   Specimen Description   Final    BLOOD RIGHT ANTECUBITAL Performed at  [facility] [address] [place],  [address]    Special Requests   Final  BOTTLES DRAWN AEROBIC AND ANAEROBIC Blood Culture adequate volume Performed at [facility] [address], [place], [place] [address]    Culture   Final    NO GROWTH 5 DAYS Performed at [facility], [place] [address], [place], [place] [address]    Report Status 07/13/2019 FINAL  Final  Urine culture     Status: None   Collection Time: 07/08/19  6:30 PM   Specimen: In/Out Cath Urine  Result Value Ref Range Status   Specimen Description   Final    IN/OUT CATH URINE Performed at [facility] [address] [place], [place] [address]    Special Requests   Final    NONE Performed at [facility], [place] [address] [place], [place] [address]    Culture   Final    NO GROWTH Performed at [facility], [place] [address], [place], [place] [address]    Report Status 07/09/2019 FINAL  Final  MRSA PCR Screening     Status: None   Collection Time: 07/08/19  9:29 PM   Specimen: Nasopharyngeal  Result Value Ref Range Status   MRSA by PCR NEGATIVE NEGATIVE Final    Comment:        The GeneXpert MRSA Assay (FDA approved for NASAL specimens only), is one component of a comprehensive MRSA colonization surveillance program. It is not intended to diagnose MRSA infection nor to guide or monitor treatment for MRSA infections. Performed at [facility] [place] [address], [place], [place] [address]   Blastomyces Antigen     Status: None   Collection Time: 07/09/19  6:33 PM   Specimen: Blood  Result Value Ref Range Status   Blastomyces Antigen None Detected None Detected ng/mL Final    Comment: (NOTE) Results reported as ng/mL in 0.2 - 14.7 ng/mL range Results above the limit of detection but below 0.2 ng/mL are reported as 'Positive, Below the Limit of Quantification' Results above 14.7 ng/mL are  reported as 'Positive, Above the Limit of Quantification'    Specimen Type SERUM  Final    Comment: (NOTE) Performed At: [place], [place] [id number] Noralyn Pick MD [id number]   Pneumocystis smear by DFA     Status: None   Collection Time: 07/12/19  2:26 PM   Specimen: Expectorated Sputum; Respiratory  Result Value Ref Range Status   Specimen Source-PJSRC EXPECTORATED SPUTUM  Final   Pneumocystis jiroveci Ag NEGATIVE  Final    Comment: Performed at [facility] [place] Performed at [facility] [place] [address], [place], [place] [address]   Expectorated sputum assessment w rflx to resp cult     Status: None   Collection Time: 07/14/19 11:56 AM   Specimen: Sputum  Result Value Ref Range Status   Specimen Description SPUTUM  Final   Special Requests Immunocompromised  Final   Sputum evaluation   Final    THIS SPECIMEN IS ACCEPTABLE FOR SPUTUM CULTURE Performed at [facility], [place] [address] [place], [place] [address]    Report Status 07/14/2019 FINAL  Final  Culture, respiratory     Status: None (Preliminary result)   Collection Time: 07/14/19 11:56 AM   Specimen: SPU  Result Value Ref Range Status   Specimen Description   Final    SPUTUM Performed at [facility] [address], [place], [place] [address]    Special  Requests   Final    Immunocompromised Reflexed from [id number] Performed at [facility], [place] [address], [place], [place] [address]    Gram Stain NO WBC SEEN NO ORGANISMS SEEN   Final   Culture   Final    FEW STAPHYLOCOCCUS EPIDERMIDIS SUSCEPTIBILITIES TO FOLLOW Performed at [facility], [place] [address] [place], [place] [address]    Report Status PENDING  Incomplete    Studies/Results: DG Chest Port 1 View  Result Date: 07/16/2019 CLINICAL DATA:  Respiratory failure EXAM: PORTABLE CHEST 1 VIEW COMPARISON:  Five days  ago FINDINGS: New pneumomediastinum and chest wall emphysema that is moderately extensive. No convincing pneumothorax, paramediastinal left apical fine line is likely related to the pneumomediastinum. Borderline cardiomegaly. Extensive bilateral airspace disease with low lung volumes. Port with tip at the upper cavoatrial junction. The stomach is moderately distended. These results will be called to the ordering clinician or representative by the Radiologist Assistant, and communication documented in the PACS or [employer]. IMPRESSION: 1. New pneumomediastinum and bilateral chest wall emphysema. No definite pneumothorax but recommend short follow-up to re-evaluate the left apex. 2. Low volume chest with confluent airspace disease. 3. Moderate gaseous distension of the stomach. Electronically Signed   By: Monte Fantasia M.D.   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 male with Hodgkin's lymphoma on chemotherapy, with Covid pneumonia in February and supplemental oxygen requirements afterwards who presents with acute on chronic respiratory failure with bilateral diffuse pulmonary infiltrates.  Serum crypto ag, urine histo ag negative, legionella ag negative Fungitell is 231, galactomannan -, PCP DFA on sputum but on therapy is negative  1..Bilateral diffuse pulmonary infiltrates:  Despite the negative DFA for PCP it remains high in my differential  And he was already on therapy long before it was sent  Continue   clindamycin and primaquine (G6PD negative)  He may very well require BAL at some point   2 Thrush: on  fluconazole     LOS: 9 days   Alcide Evener 07/17/2019, 5:13 PM

## 2019-07-17 NOTE — Progress Notes (Signed)
eLink Physician-Brief Progress Note Patient Name: [name] DOB: [birth year] MRN: [id number]   Date of Service  07/17/2019  HPI/Events of Note  HTN -BP = 164/95.  eICU Interventions  Will order: 1. Hydralazine 25 mg PO Q 6 hours PRN SBP > 160 or DBP > 100.      Intervention Category Major Interventions: Hypertension - evaluation and management  [name] 07/17/2019, 8:33 PM

## 2019-07-18 ENCOUNTER — Inpatient Hospital Stay (HOSPITAL_COMMUNITY): Payer: BC Managed Care – PPO

## 2019-07-18 DIAGNOSIS — Z9911 Dependence on respirator [ventilator] status: Secondary | ICD-10-CM

## 2019-07-18 DIAGNOSIS — C8190 Hodgkin lymphoma, unspecified, unspecified site: Secondary | ICD-10-CM | POA: Diagnosis not present

## 2019-07-18 DIAGNOSIS — J189 Pneumonia, unspecified organism: Secondary | ICD-10-CM | POA: Diagnosis not present

## 2019-07-18 DIAGNOSIS — E440 Moderate protein-calorie malnutrition: Secondary | ICD-10-CM | POA: Diagnosis not present

## 2019-07-18 DIAGNOSIS — J9601 Acute respiratory failure with hypoxia: Secondary | ICD-10-CM | POA: Diagnosis not present

## 2019-07-18 DIAGNOSIS — J9621 Acute and chronic respiratory failure with hypoxia: Secondary | ICD-10-CM | POA: Diagnosis not present

## 2019-07-18 DIAGNOSIS — B379 Candidiasis, unspecified: Secondary | ICD-10-CM | POA: Diagnosis not present

## 2019-07-18 DIAGNOSIS — R918 Other nonspecific abnormal finding of lung field: Secondary | ICD-10-CM | POA: Diagnosis not present

## 2019-07-18 LAB — BASIC METABOLIC PANEL WITH GFR
Anion gap: 8 (ref 5–15)
BUN: 35 mg/dL — ABNORMAL HIGH (ref 8–23)
CO2: 33 mmol/L — ABNORMAL HIGH (ref 22–32)
Calcium: 9.3 mg/dL (ref 8.9–10.3)
Chloride: 93 mmol/L — ABNORMAL LOW (ref 98–111)
Creatinine, Ser: 0.64 mg/dL (ref 0.61–1.24)
GFR calc Af Amer: >60 mL/min (ref >60)
GFR calc non Af Amer: >60 mL/min (ref >60)
Glucose, Bld: 108 mg/dL — ABNORMAL HIGH (ref 70–99)
Potassium: 5.4 mmol/L — ABNORMAL HIGH (ref 3.5–5.1)
Sodium: 134 mmol/L — ABNORMAL LOW (ref 135–145)

## 2019-07-18 LAB — CBC
HCT: 36.5 % — ABNORMAL LOW (ref 39.0–52.0)
Hemoglobin: 11 g/dL — ABNORMAL LOW (ref 13.0–17.0)
MCH: 28.6 pg (ref 26.0–34.0)
MCHC: 30.1 g/dL (ref 30.0–36.0)
MCV: 95.1 fL (ref 80.0–100.0)
Platelets: 421 10*3/uL — ABNORMAL HIGH (ref 150–400)
RBC: 3.84 MIL/uL — ABNORMAL LOW (ref 4.22–5.81)
RDW: 19.3 % — ABNORMAL HIGH (ref 11.5–15.5)
WBC: 20 10*3/uL — ABNORMAL HIGH (ref 4.0–10.5)
nRBC: 0 % (ref 0.0–0.2)

## 2019-07-18 LAB — BLOOD GAS, ARTERIAL
Acid-Base Excess: 4.4 mmol/L — ABNORMAL HIGH (ref 0.0–2.0)
Acid-Base Excess: 3.3 mmol/L — ABNORMAL HIGH (ref 0.0–2.0)
Bicarbonate: 31.6 mmol/L — ABNORMAL HIGH (ref 20.0–28.0)
Bicarbonate: 31.6 mmol/L — ABNORMAL HIGH (ref 20.0–28.0)
Drawn by: 23281
Drawn by: 270211
FIO2: 100
FIO2: 100
MECHVT: 510 mL
MECHVT: 360 mL
O2 Saturation: 98.8 %
O2 Saturation: 98.9 %
PEEP: 10 cmH2O
Patient temperature: 98.6
Patient temperature: 98.6
RATE: 14 resp/min
RATE: 28 resp/min
pCO2 arterial: 65.6 mmHg (ref 32.0–48.0)
pCO2 arterial: 71.5 mmHg (ref 32.0–48.0)
pH, Arterial: 7.304 — ABNORMAL LOW (ref 7.350–7.450)
pH, Arterial: 7.268 — ABNORMAL LOW (ref 7.350–7.450)
pO2, Arterial: 137 mmHg — ABNORMAL HIGH (ref 83.0–108.0)
pO2, Arterial: 162 mmHg — ABNORMAL HIGH (ref 83.0–108.0)

## 2019-07-18 LAB — PHOSPHORUS
Phosphorus: 4.7 mg/dL — ABNORMAL HIGH (ref 2.5–4.6)
Phosphorus: 5.4 mg/dL — ABNORMAL HIGH (ref 2.5–4.6)

## 2019-07-18 LAB — CULTURE, RESPIRATORY W GRAM STAIN: Gram Stain: NONE SEEN

## 2019-07-18 LAB — BODY FLUID CELL COUNT WITH DIFFERENTIAL
Lymphs, Fluid: 5 %
Monocyte-Macrophage-Serous Fluid: 16 % — ABNORMAL LOW (ref 50–90)
Neutrophil Count, Fluid: 79 % — ABNORMAL HIGH (ref 0–25)
Total Nucleated Cell Count, Fluid: 640 cu mm (ref 0–1000)

## 2019-07-18 LAB — GLUCOSE, CAPILLARY
Glucose-Capillary: 110 mg/dL — ABNORMAL HIGH (ref 70–99)
Glucose-Capillary: 130 mg/dL — ABNORMAL HIGH (ref 70–99)
Glucose-Capillary: 130 mg/dL — ABNORMAL HIGH (ref 70–99)
Glucose-Capillary: 129 mg/dL — ABNORMAL HIGH (ref 70–99)
Glucose-Capillary: 123 mg/dL — ABNORMAL HIGH (ref 70–99)

## 2019-07-18 LAB — MAGNESIUM
Magnesium: 2 mg/dL (ref 1.7–2.4)
Magnesium: 2 mg/dL (ref 1.7–2.4)

## 2019-07-18 LAB — TRIGLYCERIDES: Triglycerides: 150 mg/dL — ABNORMAL HIGH (ref <150)

## 2019-07-18 MED ORDER — VITAL HIGH PROTEIN PO LIQD
1000.0000 mL | ORAL | Status: AC
  Administered 2019-07-18 – 2019-07-23 (×8): 1000 mL

## 2019-07-18 MED ORDER — FENTANYL CITRATE (PF) 100 MCG/2ML IJ SOLN
50.0000 ug | INTRAMUSCULAR | Status: DC | PRN
  Administered 2019-07-18 – 2019-07-27 (×15): 50 ug via INTRAVENOUS
  Filled 2019-07-18 (×4): qty 2

## 2019-07-18 MED ORDER — PHENYLEPHRINE HCL-NACL 10-0.9 MG/250ML-% IV SOLN
0.0000 ug/min | INTRAVENOUS | Status: DC
  Administered 2019-07-18: 11:00:00 65 ug/min via INTRAVENOUS
  Administered 2019-07-18: 08:00:00 85 ug/min via INTRAVENOUS
  Administered 2019-07-18 (×2): 125 ug/min via INTRAVENOUS
  Administered 2019-07-18: 145 ug/min via INTRAVENOUS
  Administered 2019-07-18: 125 ug/min via INTRAVENOUS
  Filled 2019-07-18 (×7): qty 250

## 2019-07-18 MED ORDER — SODIUM CHLORIDE 0.9 % IV SOLN: INTRAVENOUS | Status: DC

## 2019-07-18 MED ORDER — FENTANYL 2500MCG IN NS 250ML (10MCG/ML) PREMIX INFUSION
0.0000 ug/h | INTRAVENOUS | Status: DC
  Administered 2019-07-18: 19:00:00 25 ug/h via INTRAVENOUS
  Administered 2019-07-19 – 2019-07-20 (×3): 150 ug/h via INTRAVENOUS
  Administered 2019-07-22: 225 ug/h via INTRAVENOUS
  Administered 2019-07-22: 150 ug/h via INTRAVENOUS
  Administered 2019-07-23 – 2019-07-24 (×4): 250 ug/h via INTRAVENOUS
  Administered 2019-07-25: 200 ug/h via INTRAVENOUS
  Administered 2019-07-25: 250 ug/h via INTRAVENOUS
  Administered 2019-07-25: 275 ug/h via INTRAVENOUS
  Administered 2019-07-26: 200 ug/h via INTRAVENOUS
  Administered 2019-07-27: 75 ug/h via INTRAVENOUS
  Filled 2019-07-18 (×16): qty 250

## 2019-07-18 MED ORDER — FENTANYL CITRATE (PF) 100 MCG/2ML IJ SOLN
INTRAMUSCULAR | Status: AC
  Administered 2019-07-18: 100 ug via INTRAVENOUS
  Filled 2019-07-18: qty 2

## 2019-07-18 MED ORDER — MELATONIN 3 MG PO TABS: 3.0000 mg | ORAL_TABLET | Freq: Every evening | ORAL | Status: DC | PRN

## 2019-07-18 MED ORDER — FERROUS SULFATE 300 (60 FE) MG/5ML PO SYRP
300.0000 mg | ORAL_SOLUTION | Freq: Every day | ORAL | Status: DC
  Administered 2019-07-18 – 2019-08-28 (×39): 300 mg
  Filled 2019-07-18 (×42): qty 5

## 2019-07-18 MED ORDER — LIDOCAINE HCL 2 % IJ SOLN
INTRAMUSCULAR | Status: AC
  Administered 2019-07-18: 800 mg
  Filled 2019-07-18: qty 40

## 2019-07-18 MED ORDER — PROPOFOL 1000 MG/100ML IV EMUL
5.0000 ug/kg/min | INTRAVENOUS | Status: DC
  Administered 2019-07-18: 65 ug/kg/min via INTRAVENOUS
  Administered 2019-07-18: 45 ug/kg/min via INTRAVENOUS
  Administered 2019-07-18: 13:00:00 65 ug/kg/min via INTRAVENOUS
  Administered 2019-07-19 (×3): 35 ug/kg/min via INTRAVENOUS
  Administered 2019-07-19: 55 ug/kg/min via INTRAVENOUS
  Administered 2019-07-20 – 2019-07-21 (×3): 25 ug/kg/min via INTRAVENOUS
  Administered 2019-07-21 (×2): 30 ug/kg/min via INTRAVENOUS
  Administered 2019-07-22 (×2): 35 ug/kg/min via INTRAVENOUS
  Administered 2019-07-23 – 2019-07-24 (×9): 40 ug/kg/min via INTRAVENOUS
  Administered 2019-07-25: 30 ug/kg/min via INTRAVENOUS
  Administered 2019-07-25: 55 ug/kg/min via INTRAVENOUS
  Administered 2019-07-25: 40 ug/kg/min via INTRAVENOUS
  Administered 2019-07-25: 5 ug/kg/min via INTRAVENOUS
  Administered 2019-07-26: 15 ug/kg/min via INTRAVENOUS
  Administered 2019-07-26 (×3): 55 ug/kg/min via INTRAVENOUS
  Filled 2019-07-18 (×20): qty 100
  Filled 2019-07-18: qty 200
  Filled 2019-07-18 (×13): qty 100

## 2019-07-18 MED ORDER — INSULIN ASPART 100 UNIT/ML ~~LOC~~ SOLN
3.0000 [IU] | SUBCUTANEOUS | Status: DC
  Administered 2019-07-18 – 2019-07-19 (×5): 3 [IU] via SUBCUTANEOUS

## 2019-07-18 MED ORDER — PROPOFOL 1000 MG/100ML IV EMUL
INTRAVENOUS | Status: AC
  Administered 2019-07-18: 06:00:00 20 ug/kg/min via INTRAVENOUS
  Filled 2019-07-18: qty 100

## 2019-07-18 MED ORDER — SODIUM CHLORIDE 0.9 % IV SOLN: INTRAVENOUS | Status: DC | PRN

## 2019-07-18 MED ORDER — PRIMAQUINE PHOSPHATE 26.3 MG PO TABS
30.0000 mg | ORAL_TABLET | Freq: Every day | ORAL | Status: AC
  Administered 2019-07-18 – 2019-07-30 (×13): 30 mg
  Filled 2019-07-18 (×13): qty 2

## 2019-07-18 MED ORDER — ORAL CARE MOUTH RINSE: 15.0000 mL | OROMUCOSAL | Status: DC

## 2019-07-18 MED ORDER — FUROSEMIDE 10 MG/ML IJ SOLN
40.0000 mg | Freq: Once | INTRAMUSCULAR | Status: AC
  Administered 2019-07-18: 01:00:00 40 mg via INTRAVENOUS
  Filled 2019-07-18: qty 4

## 2019-07-18 MED ORDER — DEXMEDETOMIDINE HCL IN NACL 200 MCG/50ML IV SOLN: 0.4000 ug/kg/h | INTRAVENOUS | Status: DC

## 2019-07-18 MED ORDER — CHLORHEXIDINE GLUCONATE 0.12% ORAL RINSE (MEDLINE KIT)
15.0000 mL | Freq: Two times a day (BID) | OROMUCOSAL | Status: DC
  Administered 2019-07-18 – 2019-08-28 (×83): 15 mL via OROMUCOSAL

## 2019-07-18 MED ORDER — FREE WATER
200.0000 mL | Freq: Four times a day (QID) | Status: DC
  Administered 2019-07-18 – 2019-07-21 (×12): 200 mL

## 2019-07-18 MED ORDER — PHENYLEPHRINE HCL-NACL 10-0.9 MG/250ML-% IV SOLN
INTRAVENOUS | Status: AC
  Administered 2019-07-18: 50 ug/min via INTRAVENOUS
  Filled 2019-07-18: qty 250

## 2019-07-18 MED ORDER — ORAL CARE MOUTH RINSE
15.0000 mL | OROMUCOSAL | Status: DC
  Administered 2019-07-18 – 2019-08-28 (×384): 15 mL via OROMUCOSAL

## 2019-07-18 MED ORDER — ACETAMINOPHEN 160 MG/5ML PO SOLN
650.0000 mg | ORAL | Status: DC | PRN
  Administered 2019-07-24 – 2019-08-10 (×6): 650 mg
  Filled 2019-07-18 (×6): qty 20.3

## 2019-07-18 MED ORDER — ATORVASTATIN CALCIUM 10 MG PO TABS
10.0000 mg | ORAL_TABLET | Freq: Every day | ORAL | Status: DC
  Administered 2019-07-18 – 2019-08-28 (×40): 10 mg
  Filled 2019-07-18 (×41): qty 1

## 2019-07-18 MED ORDER — PHENYLEPHRINE CONCENTRATED 100MG/250ML (0.4 MG/ML) INFUSION SIMPLE
0.0000 ug/min | INTRAVENOUS | Status: DC
  Administered 2019-07-18: 145 ug/min via INTRAVENOUS
  Administered 2019-07-19: 245 ug/min via INTRAVENOUS
  Administered 2019-07-19: 235 ug/min via INTRAVENOUS
  Administered 2019-07-19: 240 ug/min via INTRAVENOUS
  Administered 2019-07-20: 150 ug/min via INTRAVENOUS
  Filled 2019-07-18 (×6): qty 250

## 2019-07-18 MED ORDER — PRO-STAT SUGAR FREE PO LIQD
30.0000 mL | Freq: Two times a day (BID) | ORAL | Status: DC
  Administered 2019-07-18 – 2019-07-24 (×13): 30 mL
  Filled 2019-07-18 (×13): qty 30

## 2019-07-18 MED ORDER — FENTANYL CITRATE (PF) 100 MCG/2ML IJ SOLN
INTRAMUSCULAR | Status: AC
  Filled 2019-07-18: qty 2

## 2019-07-18 MED ORDER — CHLORHEXIDINE GLUCONATE 0.12% ORAL RINSE (MEDLINE KIT): 15.0000 mL | Freq: Two times a day (BID) | OROMUCOSAL | Status: DC

## 2019-07-18 MED ORDER — ETOMIDATE 2 MG/ML IV SOLN
20.0000 mg | Freq: Once | INTRAVENOUS | Status: AC
  Administered 2019-07-18: 20 mg via INTRAVENOUS

## 2019-07-18 MED ORDER — ADULT MULTIVITAMIN LIQUID CH
15.0000 mL | Freq: Every day | ORAL | Status: DC
  Administered 2019-07-18 – 2019-08-28 (×40): 15 mL
  Filled 2019-07-18 (×40): qty 15

## 2019-07-18 MED ORDER — SODIUM ZIRCONIUM CYCLOSILICATE 10 G PO PACK
10.0000 g | PACK | Freq: Once | ORAL | Status: AC
  Administered 2019-07-18: 11:00:00 10 g via ORAL
  Filled 2019-07-18: qty 1

## 2019-07-18 MED ORDER — FENTANYL CITRATE (PF) 100 MCG/2ML IJ SOLN: 100.0000 ug | Freq: Once | INTRAMUSCULAR | Status: AC

## 2019-07-18 NOTE — Progress Notes (Signed)
NAME:  Steve [name], MRN:  [id number], DOB:  1956/03/13, LOS: 48 ADMISSION DATE:  07/08/2019, CONSULTATION DATE:  3/29  REFERRING MD:  [name], CHIEF COMPLAINT:  dyspnea   Brief History   64 y/o male, with prior COVID infection 05/2019, admitted with severe acute respiratory failure with hypoxemia in the setting of receiving chemotherapy for Hodgkin's lymphoma.    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 ARDS protocol  Consults:  ID Oncology  Procedures:  Cefepime 3/29 >> 3/31 Vanc 3/29 >> 3/30 Azithromycin 3/29 >> 4/1 Bactrim 3/31 >> 4/5  Fluconazole 3/29 >> Meropenem 3/31 >> Clindamycin 4/5 >> Primaquine 4/5 >>  Significant Diagnostic Tests:   CTA chest 3/29 > no PE, diffuse GGO upper lobes, patchy consolidation in bases, peripheral fibrotic changes noted on prior CT chest still present  ECHO 3/30 > LVEF 60 to 42%, grade 1 diastolic dysfunction.  Mildly dilated LA, normal RV. Trivial MR and TR, otherwise normal valves  Micro Data:  UC 3/29 >> negative  BCx2 3/29 >> negative  PJP 4/2 >> negative  Fungitel 3/29 >> positive Blastomyces Antigen 3/20 >> negative Respiratory Culture 4/4 >> staph epidermis >> S-vanco, clinda BAL 4/8 >>  Fungal (BAL) 4/8 >>  PJP (BAL) 4/8 >>  Aspergillus (BAL) 4/8 >>  Antimicrobials:  Cefepime 3/29 >> 3/31 Vanc 3/29 >> 3/30 Azithromycin 3/29 >> 4/1 Fluconazole 3/29 >> Meropenem 3/31 >> Bactrim 3/31 >> Clindamycin 4/5 >>   Interim history/subjective:  Intubated overnight for worsening respiratory fatigue / hypoxemia  On 60% FiO2, PEEP 10 Glucose range 110-210 Afebrile / WBC 20 Staph epidermis in tracheal aspirate   Objective   Blood  pressure (!) 89/66, pulse 84, temperature 97.6 F (36.4 C), temperature source Oral, resp. rate (!) 27, height _0  (1.676 m), weight 65.1 kg, SpO2 98 %.    Vent Mode: PRVC FiO2 (%):  [60 %-100 %] 60 % Set Rate:  [14 bmp-28 bmp] 28 bmp Vt Set:  [380 mL-510 mL] 380 mL PEEP:  [5 cmH20-10 cmH20] 10 cmH20 Plateau Pressure:  [33 cmH20] 33 cmH20   Intake/Output Summary (Last 24 hours) at 07/18/2019 7062 Last data filed at 07/18/2019 0800 Gross per 24 hour  Intake 1266.1 ml  Output 1850 ml  Net -583.9 ml   Filed Weights   07/16/19 0500 07/17/19 0225 07/18/19 0409  Weight: 68.2 kg 67.9 kg 65.1 kg    Examination: General: chronically ill appearing adult male lying in bed on vent in NAD   HEENT: MM pink/dry, ETT, pupils reactive Neuro: sedate on propofol  CV: s1s2 RRR, no m/r/g, left chest wall port  PULM: paradoxical on vent, lungs bilaterally coarse with basilar crackles   GI: soft, bsx4 active  Extremities: warm/dry, no edema  Skin: no rashes or lesions  Resolved Hospital Problem list     Assessment & Plan:   Acute Hypoxemic Respiratory Failure ARDS Patient post COVID, could very well be infectious pneumonia or drug effect (brentuximab).  Brentuximab carries 5% risk of pulmonary toxicity which occurs weeks into administration, treated with steroids and with holding agent. PJP stain negative 4/5.  Serum fungitel positive. Serum crypto, urine histo, legionella negative. S/P FOB on 4/8.   -appreciate ID input, concern for PJP  remains high despite negative DFA -continue solumedrol  -follow BAL  -low Vt ventilation 4-8cc/kg -goal plateau pressure <30, driving pressure <37 cm H2O -target PaO2 55-65, titrate PEEP/FiO2 per ARDS protocol  -if P/F ratio <150, consider prone therapy for 16 hours per day -VAP prevention measures  -follow intermittent CXR   Sedation Needs in setting of Mechanical Ventilation  -PAD protocol, RASS Goal -2 to -3 with vent synchrony  -propofol + PRN fentanyl    Hypotension  Suspect element of sedation  -neosynephrine for MAP >65   Hyperkalemia -lokelma x1  -follow up BMP   DM2 -ssi, resistant scale  -continue lantus 8 units BID -change novolog coverage to 3 units Q4   Chronic anemia -trend CBC -transfuse for Hgb <7%  Oral Thrush -fluconazole   Hodgkin's Lymphoma Per ONC, he has good potential to respond to therapy -per ONC  HLD -continue lipitor   Severe Protein Calorie Malnutrition -TF per Nutrition  -maximize protein / caloric intake  -stop robinul   Physical deconditioning -PT efforts, mobilize  Best practice:  Diet: TF Pain/Anxiety/Delirium protocol (if indicated): propofol  VAP protocol (if indicated): in place  DVT prophylaxis: lovenox GI prophylaxis: PPI  Glucose control: SSI + glargine Mobility: BR Code Status: Full code  Family Communication: Wife updated 4/8 per [name]  Disposition: ICU  Labs   CBC: Recent Labs  Lab 07/14/19 0458 07/14/19 0458 07/15/19 0332 07/15/19 1021 07/16/19 0112 07/17/19 0355 07/18/19 0434  WBC 22.0*  --  19.1*  --  18.8* 14.8* 20.0*  NEUTROABS  --   --  16.6*  --  15.9* 11.8*  --   HGB 8.9*   < > 9.3* 10.3* 9.8* 9.6* 11.0*  HCT 28.3*  --  29.7*  --  31.6* 31.2* 36.5*  MCV 92.2  --  93.7  --  92.4 95.4 95.1  PLT 302  --  318  --  359 311 421*   < > = values in this interval not displayed.    Basic Metabolic Panel: Recent Labs  Lab 07/12/19 0408 07/13/19 0321 07/14/19 0488 07/14/19 8916 07/15/19 9450 07/15/19 0602 07/16/19 0112 07/16/19 0112 07/16/19 3888 07/16/19 1305 07/16/19 1810 07/17/19 0355 07/18/19 0434  NA 136   < > 131*  --  129*  --  132*  --   --   --   --  138 134*  K 4.1   < > 5.2*   < > 6.2*   < > 5.9*  5.9*   < > 4.8 5.1 4.6 5.0 5.4*  CL 99   < > 96*  --  95*  --  97*  --   --   --   --  100 93*  CO2 27   < > 26  --  26  --  26  --   --   --   --  31 33*  GLUCOSE 217*   < > 174*  --  226*  --  161*  --   --   --   --  123* 108*    BUN 20   < > 31*  --  32*  --  39*  --   --   --   --  40* 35*  CREATININE 0.78   < > 0.89  --  0.91  --  0.97  --   --   --   --  0.66 0.64  CALCIUM 8.1*   < > 8.5*  --  8.4*  --  9.1  --   --   --   --  9.0 9.3  MG 2.0  --  2.1  --   --   --   --   --   --   --   --   --   --    < > = values in this interval not displayed.   GFR: Estimated Creatinine Clearance: 85.3 mL/min (by C-G formula based on SCr of 0.64 mg/dL). Recent Labs  Lab 07/15/19 0332 07/16/19 0112 07/17/19 0355 07/18/19 0434  WBC 19.1* 18.8* 14.8* 20.0*    Liver Function Tests: Recent Labs  Lab 07/15/19 0332 07/16/19 0112 07/17/19 0355  AST 18 37 27  ALT 17 33 36  ALKPHOS 70 94 99  BILITOT 0.4 0.7 0.7  PROT 7.1 7.7 7.3  ALBUMIN 2.2* 2.5* 2.5*   No results for input(s): LIPASE, AMYLASE in the last 168 hours. No results for input(s): AMMONIA in the last 168 hours.  ABG    Component Value Date/Time   PHART 7.268 (L) 07/18/2019 0825   PCO2ART 71.5 (HH) 07/18/2019 0825   PO2ART 162 (H) 07/18/2019 0825   HCO3 31.6 (H) 07/18/2019 0825   TCO2 28 07/08/2019 1331   O2SAT 98.9 07/18/2019 0825     Coagulation Profile: No results for input(s): INR, PROTIME in the last 168 hours.  Cardiac Enzymes: No results for input(s): CKTOTAL, CKMB, CKMBINDEX, TROPONINI in the last 168 hours.  HbA1C: Hgb A1c MFr Bld  Date/Time Value Ref Range Status  07/09/2019 05:10 AM 7.3 (H) 4.8 - 5.6 % Final    Comment:    (NOTE) Pre diabetes:          5.7%-6.4% Diabetes:              >6.4% Glycemic control for   <7.0% adults with diabetes   05/19/2019 03:56 AM 8.3 (H) 4.8 - 5.6 % Final    Comment:    (NOTE) Pre diabetes:          5.7%-6.4% Diabetes:              >6.4% Glycemic control for   <7.0% adults with diabetes     CBG: Recent Labs  Lab 07/17/19 1545 07/17/19 1937 07/17/19 2305 07/18/19 0326 07/18/19 0734  GLUCAP 188* 152* 210* 110* 130*       Critical care time: 58 minutes    Noe Gens, MSN,  NP-C [facility] 07/18/2019, 9:24 AM   Please see [email or web address] for pager details.

## 2019-07-18 NOTE — Procedures (Signed)
Intubation Procedure Note Steve [name] [id number] 24-Dec-1955  Procedure: Intubation Indications: Respiratory insufficiency  Procedure Details Consent: Unable to obtain consent because of emergent medical necessity. Time Out: Verified patient identification, verified procedure, site/side was marked, verified correct patient position, special equipment/implants available, medications/allergies/relevent history reviewed, required imaging and test results available.  Performed  Maximum sterile technique was used including cap, gloves, hand hygiene and mask.  3    Evaluation Hemodynamic Status: Persistent hypotension treated with pressors and fluid; O2 sats: stable throughout Patient's Current Condition: stable Complications: No apparent complications Patient did tolerate procedure well. Chest X-ray ordered to verify placement.  CXR: pending.   Shellia Cleverly 07/18/2019

## 2019-07-18 NOTE — Progress Notes (Signed)
[facility] Progress Note Patient Name: Steve [name] DOB: 26-Jan-1956 MRN: [id number]   Date of Service  07/18/2019  HPI/Events of Note  Hypoxia - Sat = 84% on 60 L/min HFNC + NRBM. Patient did not tolerate BiPAP. He likely needs intubation and ventilation.   eICU Interventions  Will ask ground team to evaluate him at bedside.      Intervention Category Major Interventions: Hypoxemia - evaluation and management  [name] 07/18/2019, 4:55 AM

## 2019-07-18 NOTE — Progress Notes (Signed)
Nutrition Follow-up  RD working remotely.   DOCUMENTATION CODES:   Non-severe (moderate) malnutrition in context of chronic illness  INTERVENTION:  - recommend discontinue marinol while intubated. - TF recommendation: Vital High Protein @ 20 ml/hr to advance by 10 ml every 8 hours to reach goal rate of 40 ml/hr with 30 ml prostat BID. - at goal rate, this regimen + kcal from current propofol rate will provide 1624 kcal, 114 grams protein, and 802 ml free water.   Monitor magnesium, potassium, and phosphorus daily for at least 3 days, MD to replete as needed, as pt is at risk for refeeding syndrome given moderate malnutrition, poor oral intakes for at least 2 weeks.    NUTRITION DIAGNOSIS:   Moderate Malnutrition related to chronic illness, cancer and cancer related treatments as evidenced by mild fat depletion, mild muscle depletion, percent weight loss. -ongoing  GOAL:   Patient will meet greater than or equal to 90% of their needs -unable to meet at this time.   MONITOR:   Vent status, Labs, Weight trends  REASON FOR ASSESSMENT:   Ventilator, Consult Calorie Count  ASSESSMENT:   64 year-old male with a history of Hodgkin's lymphoma (stage 3, diagnosed 02/2019) on chemotherapy. He follows with Dr. Maylon Peppers in Oncology. His most recent treatment was on 3/8. He presented to the ED with worsening respiratory status and inability to tolerate PO intake recently. He had his chemo delayed this week. He has a history of neutropenic fever in the past. He had a good response to chemo on his first follow up PET scan. His appetite has been poor and he has been receiving IV fluids.  Patient was intubated and OGT placed today at 0525. Calorie Count had been ordered yesterday afternoon; will discontinue this order. TF recommendations outlined above. Weight continues to trend down; lasix ordered.    Patient is currently intubated on ventilator support MV: 11.6 L/min Temp (24hrs), Avg:97.3 F  (36.3 C), Min:97.2 F (36.2 C), Max:97.4 F (36.3 C) Propofol: 17.58 ml/hr (464 kcal)    Labs reviewed; CBGs: 110 and 130 mg/dl, Na: 134 mmol/l, K: 5.4 mmol/l, Cl: 93 mmol/l, BUN: 35 mg/dl.  Medications reviewed; 5 mg marinol BID (started 4/7), 300 mg ferrous sulfate/day, 40 mg IV lasix x1 dose 4/8, sliding scale novolog, 5 units novolog TID, 8 units lantus BID, 60 mg solu-medrol BID, 15 ml multivitamin/day, 40 mg IV protonix/day. Drips; neo @ 85 mcg/min, propofol @ 45 mcg/kg/min.  Diet Order:   Diet Order            Diet regular Room service appropriate? Yes; Fluid consistency: Thin  Diet effective now              EDUCATION NEEDS:   No education needs have been identified at this time  Skin:  Skin Assessment: Reviewed RN Assessment  Last BM:  4/6  Height:   Ht Readings from Last 1 Encounters:  07/18/19 5\' 6"  (1.676 m)    Weight:   Wt Readings from Last 1 Encounters:  07/18/19 65.1 kg    Estimated Nutritional Needs:  Kcal:  1547 kcal Protein:  110-120 grams Fluid:  >/= 1.8 L/day     Jarome Matin, MS, RD, LDN, CNSC Inpatient Clinical Dietitian RD pager # available in [facility]  After hours/weekend pager # available in [facility]

## 2019-07-18 NOTE — Progress Notes (Signed)
Dr. Mariane Masters called and notified family of patient's change in condition and intubation this morning. This was verified with Jeannie Done, RN from [facility] who spoke with Dr. Oletta Darter.

## 2019-07-18 NOTE — Progress Notes (Signed)
Called [facility] and spoke with Jeannie Done, RN and made her aware that patient is anxious and complaining of feeling very short of breath. Breathing treatment given over 30 minutes ago and patient was short of breath prior. o2 sats 84%, RT just turned Liter flow up from 40% to 50% and Fio2 up to 100% from 90%. [facility] RN to relay all mentioned above to Dr. Oletta Darter.

## 2019-07-18 NOTE — Progress Notes (Signed)
Dr. Mariane Masters verified OG tube placement. And gave RT order to adjust ETT.

## 2019-07-18 NOTE — Progress Notes (Addendum)
Subjective:  Patient  intubated   Antibiotics:  Anti-infectives (From admission, onward)   Start     Dose/Rate Route Frequency Ordered Stop   07/18/19 1000  primaquine tablet 30 mg     30 mg Per Tube Daily 07/18/19 0809     07/16/19 1000  primaquine tablet 30 mg  Status:  Discontinued     30 mg Oral Daily 07/16/19 0121 07/18/19 0809   07/15/19 1200  clindamycin (CLEOCIN) IVPB 600 mg     600 mg 100 mL/hr over 30 Minutes Intravenous Every 6 hours 07/15/19 1013     07/15/19 1100  primaquine tablet 30 mg  Status:  Discontinued     30 mg Oral Daily 07/15/19 1013 07/16/19 0120   07/12/19 1000  fluconazole (DIFLUCAN) IVPB 100 mg     100 mg 50 mL/hr over 60 Minutes Intravenous Every 24 hours 07/12/19 0929     07/10/19 1800  meropenem (MERREM) 1 g in sodium chloride 0.9 % 100 mL IVPB     1 g 200 mL/hr over 30 Minutes Intravenous Every 8 hours 07/10/19 1425 07/17/19 0254   07/10/19 0900  sulfamethoxazole-trimethoprim (BACTRIM) 450 mg in dextrose 5 % 500 mL IVPB  Status:  Discontinued     450 mg 352.1 mL/hr over 90 Minutes Intravenous Every 8 hours 07/10/19 0813 07/15/19 1013   07/09/19 1000  fluconazole (DIFLUCAN) tablet 100 mg  Status:  Discontinued     100 mg Oral Daily 07/08/19 1919 07/12/19 0929   07/09/19 0200  vancomycin (VANCOCIN) IVPB 1000 mg/200 mL premix  Status:  Discontinued     1,000 mg 200 mL/hr over 60 Minutes Intravenous Every 12 hours 07/08/19 1930 07/10/19 1017   07/08/19 2200  ceFEPIme (MAXIPIME) 2 g in sodium chloride 0.9 % 100 mL IVPB  Status:  Discontinued     2 g 200 mL/hr over 30 Minutes Intravenous Every 8 hours 07/08/19 1929 07/10/19 1425   07/08/19 2000  fluconazole (DIFLUCAN) tablet 200 mg     200 mg Oral  Once 07/08/19 1919 07/08/19 2005   07/08/19 1800  azithromycin (ZITHROMAX) 500 mg in sodium chloride 0.9 % 250 mL IVPB  Status:  Discontinued     500 mg 250 mL/hr over 60 Minutes Intravenous Every 24 hours 07/08/19 1736 07/12/19 0708   07/08/19  1345  ceFEPIme (MAXIPIME) 2 g in sodium chloride 0.9 % 100 mL IVPB     2 g 200 mL/hr over 30 Minutes Intravenous STAT 07/08/19 1343 07/08/19 1631   07/08/19 1345  vancomycin (VANCOREADY) IVPB 1500 mg/300 mL     1,500 mg 150 mL/hr over 120 Minutes Intravenous STAT 07/08/19 1343 07/08/19 1631      Medications: Scheduled Meds: . atorvastatin  10 mg Per Tube Daily  . chlorhexidine gluconate (MEDLINE KIT)  15 mL Mouth Rinse BID  . Chlorhexidine Gluconate Cloth  6 each Topical Daily  . enoxaparin (LOVENOX) injection  40 mg Subcutaneous Q24H  . feeding supplement (PRO-STAT SUGAR FREE 64)  30 mL Per Tube BID  . feeding supplement (VITAL HIGH PROTEIN)  1,000 mL Per Tube Q24H  . ferrous sulfate  300 mg Per Tube Q breakfast  . free water  200 mL Per Tube Q6H  . insulin aspart  3 Units Subcutaneous Q4H  . insulin aspart  3-9 Units Subcutaneous Q4H  . insulin glargine  8 Units Subcutaneous BID  . ipratropium-albuterol  3 mL Nebulization Q4H  . mouth rinse  15  mL Mouth Rinse BID  . mouth rinse  15 mL Mouth Rinse 10 times per day  . methylPREDNISolone (SOLU-MEDROL) injection  60 mg Intravenous BID  . multivitamin  15 mL Per Tube Daily  . pantoprazole (PROTONIX) IV  40 mg Intravenous QHS  . primaquine  30 mg Per Tube Daily  . sodium chloride flush  10-40 mL Intracatheter Q12H   Continuous Infusions: . sodium chloride    . clindamycin (CLEOCIN) IV Stopped (07/18/19 1220)  . fluconazole (DIFLUCAN) IV Stopped (07/18/19 1039)  . phenylephrine (NEO-SYNEPHRINE) Adult infusion 125 mcg/min (07/18/19 1444)  . propofol (DIPRIVAN) infusion 65 mcg/kg/min (07/18/19 1317)   PRN Meds:.Place/Maintain arterial line **AND** sodium chloride, acetaminophen (TYLENOL) oral liquid 160 mg/5 mL, acetaminophen, fentaNYL (SUBLIMAZE) injection, hydrALAZINE, lip balm, magic mouthwash w/lidocaine, melatonin, ondansetron (ZOFRAN) IV, sodium chloride    Objective: Weight change: -2.8 kg  Intake/Output Summary (Last 24  hours) at 07/18/2019 1530 Last data filed at 07/18/2019 1150 Gross per 24 hour  Intake 1116.1 ml  Output 2260 ml  Net -1143.9 ml   Blood pressure 91/61, pulse 91, temperature (!) 97.3 F (36.3 C), resp. rate (!) 23, height _0  (1.676 m), weight 65.1 kg, SpO2 93 %. Temp:  [96 F (35.6 C)-97.6 F (36.4 C)] 97.3 F (36.3 C) (04/08 1515) Pulse Rate:  [73-124] 91 (04/08 1515) Resp:  [12-35] 23 (04/08 1515) BP: (62-165)/(38-114) 91/61 (04/08 1500) SpO2:  [82 %-98 %] 93 % (04/08 1515) Arterial Line BP: (73-133)/(47-65) 98/49 (04/08 1515) FiO2 (%):  [60 %-100 %] 80 % (04/08 1133) Weight:  [65.1 kg] 65.1 kg (04/08 0409)  Physical Exam: General: intubated and sedated HEENT: anicteric sclera, EOMI CVS tachycardic no mgr Chest: , on ventitlator Abdomen: soft non-distended,  Extremities: no edema or deformity noted bilaterally Skin: no rashes Neuro: nonfocal  CBC:    BMET Recent Labs    07/17/19 0355 07/18/19 0434  NA 138 134*  K 5.0 5.4*  CL 100 93*  CO2 31 33*  GLUCOSE 123* 108*  BUN 40* 35*  CREATININE 0.66 0.64  CALCIUM 9.0 9.3     Liver Panel  Recent Labs    07/16/19 0112 07/17/19 0355  PROT 7.7 7.3  ALBUMIN 2.5* 2.5*  AST 37 27  ALT 33 36  ALKPHOS 94 99  BILITOT 0.7 0.7       Sedimentation Rate No results for input(s): ESRSEDRATE in the last 72 hours. C-Reactive Protein No results for input(s): CRP in the last 72 hours.  Micro Results: Recent Results (from the past 720 hour(s))  Blood Culture (routine x 2)     Status: None   Collection Time: 07/08/19  1:20 PM   Specimen: BLOOD  Result Value Ref Range Status   Specimen Description   Final    BLOOD RIGHT ANTECUBITAL Performed at [facility] [address] [place], [place] [address]    Special Requests   Final    BOTTLES DRAWN AEROBIC AND ANAEROBIC Blood Culture adequate volume Performed at [facility] [address], [place], [place] [address]     Culture   Final    NO GROWTH 5 DAYS Performed at [facility], [place] [address] [place], [place] [address]    Report Status 07/13/2019 FINAL  Final  Blood Culture (routine x 2)     Status: None   Collection Time: 07/08/19  1:25 PM   Specimen: BLOOD  Result Value Ref Range Status   Specimen Description  Final    BLOOD RIGHT ANTECUBITAL Performed at [facility] [address] [place], [place] [address]    Special Requests   Final    BOTTLES DRAWN AEROBIC AND ANAEROBIC Blood Culture adequate volume Performed at [facility] [address], [place], [place] [address]    Culture   Final    NO GROWTH 5 DAYS Performed at [facility], [place] [address] [place], [place] [address]    Report Status 07/13/2019 FINAL  Final  Urine culture     Status: None   Collection Time: 07/08/19  6:30 PM   Specimen: In/Out Cath Urine  Result Value Ref Range Status   Specimen Description   Final    IN/OUT CATH URINE Performed at [facility] [address] [place], [place] [address]    Special Requests   Final    NONE Performed at [facility], [place] [address], [place], [place] [address]    Culture   Final    NO GROWTH Performed at [facility], [place] [address] [place], [place] [address]    Report Status 07/09/2019 FINAL  Final  MRSA PCR Screening     Status: None   Collection Time: 07/08/19  9:29 PM   Specimen: Nasopharyngeal  Result Value Ref Range Status   MRSA by PCR NEGATIVE NEGATIVE Final    Comment:        The GeneXpert MRSA Assay (FDA approved for NASAL specimens only), is one component of a comprehensive MRSA colonization surveillance program. It is not intended to diagnose MRSA infection nor to guide or monitor treatment for MRSA infections. Performed at [facility], [place] [address], [place], [place] [address]   Blastomyces Antigen     Status: None   Collection  Time: 07/09/19  6:33 PM   Specimen: Blood  Result Value Ref Range Status   Blastomyces Antigen None Detected None Detected ng/mL Final    Comment: (NOTE) Results reported as ng/mL in 0.2 - 14.7 ng/mL range Results above the limit of detection but below 0.2 ng/mL are reported as 'Positive, Below the Limit of Quantification' Results above 14.7 ng/mL are reported as 'Positive, Above the Limit of Quantification'    Specimen Type SERUM  Final    Comment: (NOTE) Performed At: [facility],  [id number] Noralyn Pick MD [id number]   Pneumocystis smear by DFA     Status: None   Collection Time: 07/12/19  2:26 PM   Specimen: Expectorated Sputum; Respiratory  Result Value Ref Range Status   Specimen Source-PJSRC EXPECTORATED SPUTUM  Final   Pneumocystis jiroveci Ag NEGATIVE  Final    Comment: Performed at [facility] Performed at [facility] [address], [place], [place] [address]   Expectorated sputum assessment w rflx to resp cult     Status: None   Collection Time: 07/14/19 11:56 AM   Specimen: Sputum  Result Value Ref Range Status   Specimen Description SPUTUM  Final   Special Requests Immunocompromised  Final   Sputum evaluation   Final    THIS SPECIMEN IS ACCEPTABLE FOR SPUTUM CULTURE Performed at [facility], [place] [address], [place], [place] [address]    Report Status 07/14/2019 FINAL  Final  Culture, respiratory     Status: None   Collection Time: 07/14/19 11:56 AM   Specimen: SPU  Result Value Ref Range  Status   Specimen Description   Final    SPUTUM Performed at [facility] [address], [place], [place] [address]    Special Requests   Final    Immunocompromised Reflexed from [id number] Performed at [facility], [place] [address], [place], [place] [address]    Gram Stain   Final    NO WBC SEEN NO ORGANISMS SEEN Performed  at [facility], [place] [address] [place], [place] [address]    Culture FEW STAPHYLOCOCCUS EPIDERMIDIS  Final   Report Status 07/18/2019 FINAL  Final   Organism ID, Bacteria STAPHYLOCOCCUS EPIDERMIDIS  Final      Susceptibility   Staphylococcus epidermidis - MIC*    CIPROFLOXACIN <=0.5 SENSITIVE Sensitive     ERYTHROMYCIN >=8 RESISTANT Resistant     GENTAMICIN <=0.5 SENSITIVE Sensitive     OXACILLIN >=4 RESISTANT Resistant     TETRACYCLINE <=1 SENSITIVE Sensitive     VANCOMYCIN 2 SENSITIVE Sensitive     TRIMETH/SULFA 160 RESISTANT Resistant     CLINDAMYCIN <=0.25 SENSITIVE Sensitive     RIFAMPIN <=0.5 SENSITIVE Sensitive     Inducible Clindamycin NEGATIVE Sensitive     * FEW STAPHYLOCOCCUS EPIDERMIDIS  Culture, respiratory (non-expectorated)     Status: None (Preliminary result)   Collection Time: 07/18/19  8:42 AM   Specimen: Bronchoalveolar Lavage; Respiratory  Result Value Ref Range Status   Specimen Description   Final    BRONCHIAL ALVEOLAR LAVAGE Performed at [facility] [address], [place], [place] [address]    Special Requests   Final    NONE Performed at [facility], [place] [address] [place], [place] [address]    Gram Stain   Final    ABUNDANT WBC PRESENT, PREDOMINANTLY MONONUCLEAR FEW YEAST Performed at [facility], [place] [address], [place], [place] [address]    Culture PENDING  Incomplete   Report Status PENDING  Incomplete    Studies/Results: DG Abd 1 View  Result Date: 07/18/2019 CLINICAL DATA:  OG tube position EXAM: ABDOMEN - 1 VIEW COMPARISON:  08/31/2019 FINDINGS: Coarse interstitial markings are noted at the lung bases. A central venous catheter terminates in the right atrium. Interval placement of a gastric tube, tip in the antrum or pylorus. Increasing small bowel distension since the plain film evaluation from July 01, 2019. Visualized skeletal structures are unremarkable. IMPRESSION: 1. Interval  placement of a gastric tube, tip in the antrum or pylorus. 2. Increasing small bowel distension since the plain film evaluation from July 01, 2019. Findings may represent ileus or developing obstruction. Follow-up radiography may be helpful. Electronically Signed   By: Zetta Bills M.D.   On: 07/18/2019 08:33   DG CHEST PORT 1 VIEW  Result Date: 07/18/2019 CLINICAL DATA:  Status post intubation. EXAM: PORTABLE CHEST 1 VIEW COMPARISON:  Earlier film, same date. FINDINGS: The endotracheal tube is 3.7 cm above the carina. The NG tube is coursing down the esophagus and into the stomach. Left IJ power port is stable. Persistent diffuse interstitial and airspace process in the lungs. No definite pleural effusions. No pneumothorax. IMPRESSION: 1. The endotracheal tube is 3.7 cm above the carina. 2. Persistent diffuse interstitial and airspace process. Electronically Signed   By: Marijo Sanes M.D.   On: 07/18/2019 05:57   DG CHEST PORT 1 VIEW  Result Date: 07/18/2019 CLINICAL DATA:  Hypoxia EXAM: PORTABLE CHEST 1 VIEW COMPARISON:  07/16/2019 FINDINGS: The previously demonstrated pneumomediastinum has improved.  The previously demonstrated subcutaneous gas has improved. The left-sided Port-A-Cath is stable in positioning. Bilateral airspace opacities are again noted. There is no definite pneumothorax. There may be bilateral pleural effusions. The heart size is stable. IMPRESSION: 1. Interval improvement in the previously demonstrated pneumomediastinum and subcutaneous emphysema. 2. No convincing pneumothorax. 3. Otherwise, no significant interval change. Electronically Signed   By: Constance Holster M.D.   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 male with Hodgkin's lymphoma on chemotherapy, with Covid pneumonia in February and supplemental oxygen requirements afterwards who presents with acute on chronic respiratory failure with bilateral diffuse pulmonary infiltrates.  Serum crypto ag, urine histo ag negative, legionella ag negative Fungitell is 231, galactomannan -, PCP DFA on sputum but on therapy is negative  He has not been intubated.  [name] and Dr [facility] Flattery were sufficiently concerned that this could be still COVID 76 and this was sent from bronchoscopy with BAL along with multiple other studies  1..Bilateral diffuse pulmonary infiltrates:  Despite the negative DFA for PCP it remains high in my differential  And he was already on therapy long before it was sent  Continue   clindamycin and primaquine (G6PD negative)  followup on COVID 19  PCR from BAL along with other cultures, studies   2 Thrush: on fluconazole  3 IP He should be on airborne/contact until we have negative COVID 19 PCR  Because if he has COVID 19 that is replication competent after initial infection in February in context of chemotherapy etc it will likely be an emerging variant that evolved in him while  Being immunosuppressed.   LOS: 10 days   Alcide Evener 07/18/2019, 3:30 PM

## 2019-07-18 NOTE — Progress Notes (Signed)
Asked to evaluate patient with worsening hypoxemia.  O2 sats 84% on 15L [place] with 100% non rebreather mask.  I explained the procedure and need to patient and he agreed.  Easily intubated with 7.5 tube.  Hypotensive post intubation and have started a fluid bolus and neosynephrine

## 2019-07-18 NOTE — Progress Notes (Signed)
[facility] PCCM  I called his wife to let her know that he had bilateral pneumothoraces and needed bilateral chest tubes.  [name], MD [place] PCCM Pager: [phone] Cell: [phone] If no response, call [phone]

## 2019-07-18 NOTE — Procedures (Signed)
Chest Tube Insertion Procedure Note  Indications:  Clinically significant Pneumothorax  Pre-operative Diagnosis: Pneumothorax  Post-operative Diagnosis: Pneumothorax  Procedure Details  Informed consent was obtained for the procedure, including sedation.  Risks of lung perforation, hemorrhage, arrhythmia, and adverse drug reaction were discussed.   After sterile skin prep, using standard technique, a 28 French tube was placed in the left lateral 7th rib space.  Findings: None  Estimated Blood Loss:  Minimal         Specimens:  None              Complications:  None; patient tolerated the procedure well.         Disposition: ICU - intubated and critically ill.         Condition: stable  Attending Attestation: I performed the procedure.  [name], MD [place] PCCM Pager: [phone] Cell: [phone] If no response, call [phone]

## 2019-07-18 NOTE — Procedures (Signed)
Chest Tube Insertion Procedure Note  Indications:  Clinically significant Pneumothorax  Pre-operative Diagnosis: Pneumothorax  Post-operative Diagnosis: Pneumothorax  Procedure Details  Informed consent was obtained for the procedure, including sedation.  Risks of lung perforation, hemorrhage, arrhythmia, and adverse drug reaction were discussed.   After sterile skin prep, using standard technique, a 28 French tube was placed in the right lateral 7th rib space. Rush of air noted on entry to pleural space.  Findings: None  Estimated Blood Loss:  less than 50 mL         Specimens:  None              Complications:  None; patient tolerated the procedure well.         Disposition: ICU - intubated and critically ill.         Condition: stable  Attending Attestation: I performed the procedure.  [name], MD [place] PCCM Pager: [phone] Cell: [phone] If no response, call [phone]

## 2019-07-18 NOTE — Progress Notes (Signed)
Spoke with Dr. Oletta Darter and made MD aware that patient is complaining of shortness of breath and needing more air. RN already spoke with RT who is going to place patient on BiPAP as discussed prior with Dr. Oletta Darter if he needed it.

## 2019-07-18 NOTE — Progress Notes (Signed)
eLink Physician-Brief Progress Note Patient Name: Steve [name] DOB: September 28, 1955 MRN: [id number]   Date of Service  07/18/2019  HPI/Events of Note  ABG on 100%/PRVC 14/TV 510/P 10 - 7.30/65.6/137  eICU Interventions  Will order: 1. Increase PRVC rate to 14. 2. Repeat ABG at 8 AM.     Intervention Category Major Interventions: Respiratory failure - evaluation and management;Acid-Base disturbance - evaluation and management  [name] 07/18/2019, 6:45 AM

## 2019-07-18 NOTE — Progress Notes (Signed)
eLink Physician-Brief Progress Note Patient Name: Steve [name] DOB: 1955/12/17 MRN: [id number]   Date of Service  07/18/2019  HPI/Events of Note  Pt had bilateral chest tubes placed today for pneumothoraces, pt has residual pneumothorax bilaterally on current water seal setting. Pt is mechanically ventilated.  eICU Interventions  - 20 cm suction ordered for both chest tubes.        [name] Ogan 07/18/2019, 10:56 PM

## 2019-07-18 NOTE — Progress Notes (Signed)
Pharmacy: requested to concentrate phenylephrine drip Current drip 10 mg/250 mls 0.04 mg/ml  New drip 100 mg/250 mls 0.4 mg/ml  RN informed to reprogram alaris pump  Eudelia Bunch, Pharm.D [phone] 07/18/2019 6:50 PM

## 2019-07-18 NOTE — Procedures (Signed)
Arterial Catheter Insertion Procedure Note Steve [name] [id number] 26-Dec-1955  Procedure: Insertion of Arterial Catheter  Indications: Blood pressure monitoring  Procedure Details Consent: Unable to obtain consent because of emergent medical necessity. Time Out: Verified patient identification, verified procedure, site/side was marked, verified correct patient position, special equipment/implants available, medications/allergies/relevent history reviewed, required imaging and test results available.  Performed  Maximum sterile technique was used including antiseptics, cap, gloves, gown, hand hygiene and mask. Skin prep: Chlorhexidine; local anesthetic administered 20 gauge catheter was inserted into left radial artery using the Seldinger technique. ULTRASOUND GUIDANCE USED: NO Evaluation Blood flow good; BP tracing good. Complications: No apparent complications.   Steve [name] 07/18/2019

## 2019-07-18 NOTE — Progress Notes (Signed)
Called [facility] and spoke with Genie, RN and asked her to make MD aware that there is not an order for chest tubes and that patient does have subqemphesema bilaterally to neck and clavicular regions and that right chest tube has a leak of 2-3. Made nurse aware that day shift nurse verbally stated that [name] wants water seal to both chest tubes but there is no order reflecting what to do with chest tubes. Genie, RN to relay information to Dr. Lucile Shutters.

## 2019-07-18 NOTE — Progress Notes (Signed)
Called Dr. Oletta Darter and made MD aware that patient states he can not tolerate the BiPAP and that he isn't sure if he wants to be intubated or not. Dr. Oletta Darter stated he will call the ground team to come see patient. Patient on Bell 60 L 100% and NRB.

## 2019-07-18 NOTE — Progress Notes (Signed)
Updated patients wife [name] via phone on patient status. Informed that he would no longer be allowed visitors due to pending covid test and precautions. Explained that staff would call when results are in and update visitation as soon as possible.

## 2019-07-18 NOTE — Progress Notes (Signed)
Abg drawn from aline on the following vent setttings: prvc mode, vt510, rr14, 100%, +10 peep  Results for JAYVIAN, ESCOE (MRN [id number]) as of 07/18/2019 06:43  Ref. Range 07/18/2019 06:24  Sample type Unknown A-LINE  Delivery systems Unknown VENTILATOR  FIO2 Unknown 100.00  Mode Unknown PRESSURE REGULATED VOLUME CONTROL  VT Latest Units: mL 510  pH, Arterial Latest Ref Range: 7.350 - 7.450  7.304 (L)  pCO2 arterial Latest Ref Range: 32.0 - 48.0 mmHg 65.6 (HH)  pO2, Arterial Latest Ref Range: 83.0 - 108.0 mmHg 137 (H)  Acid-Base Excess Latest Ref Range: 0.0 - 2.0 mmol/L 4.4 (H)  Bicarbonate Latest Ref Range: 20.0 - 28.0 mmol/L 31.6 (H)  O2 Saturation Latest Units: % 98.8  Patient temperature Unknown 98.6  Collection site Unknown A-LINE  Allens test (pass/fail) Latest Ref Range: PASS  PASS

## 2019-07-18 NOTE — Progress Notes (Signed)
Pt seen, increased wob with accessory muscle use noted.  [place] turned up to 60L by RN, spo2 70s on hfnc @100 % fio2 with nrb.  Attempted to place bipap on pt, but he immediately pulled it off stating "it was too much".  Pt encouraged to try bipap due to its benefits, bt pt still refused.  RN notified and has made MD aware.  Pt placed back on 65L hhfnc @100 % fio2 and nrb, HR100, rr28, spo2 86%.  RN/RT awaiting further orders.

## 2019-07-18 NOTE — Progress Notes (Signed)
[facility]Link Physician-Brief Progress Note Patient Name: Steve [name] DOB: Aug 03, 1955 MRN: [id number]   Date of Service  07/18/2019  HPI/Events of Note  Increased O2 requirement and c/o SOB. CXR early yesterday revealed pneumomediastinum without definite evidence of pneumothorax.   eICU Interventions  Will order: 1. Portable CXR STAT.     Intervention Category Major Interventions: Hypoxemia - evaluation and management  [name] 07/18/2019, 12:31 AM

## 2019-07-18 NOTE — Progress Notes (Signed)
Dr. Oletta Darter called back and discussed chest xray.  RN made MD aware that patient is resting at this time and more comfortable, A&Ox4. MD stated he will order IV lasix and that if needed bipap should be used.

## 2019-07-18 NOTE — Progress Notes (Signed)
eLink Physician-Brief Progress Note Patient Name: Steve [name] DOB: May 24, 1955 MRN: [id number]   Date of Service  07/18/2019  HPI/Events of Note  Review of CXR - Diffuse bilateral infiltrates. Looks worse to my read. Etiology? Likely Brentuximab toxicity +/- lung injury from COVID, possible PCP/other opportunistic  Infection.   eICU Interventions  Will order: 1. Lasix 40 mg IV now.  If he fails to improve, he may require BiPAP or intubation and mechanicaal ventilation.      Intervention Category Major Interventions: Hypoxemia - evaluation and management  Tirzah Fross [place] 07/18/2019, 1:18 AM

## 2019-07-19 ENCOUNTER — Inpatient Hospital Stay (HOSPITAL_COMMUNITY): Payer: BC Managed Care – PPO

## 2019-07-19 DIAGNOSIS — J9601 Acute respiratory failure with hypoxia: Secondary | ICD-10-CM | POA: Diagnosis not present

## 2019-07-19 DIAGNOSIS — C8190 Hodgkin lymphoma, unspecified, unspecified site: Secondary | ICD-10-CM | POA: Diagnosis not present

## 2019-07-19 DIAGNOSIS — J9621 Acute and chronic respiratory failure with hypoxia: Secondary | ICD-10-CM | POA: Diagnosis not present

## 2019-07-19 DIAGNOSIS — R918 Other nonspecific abnormal finding of lung field: Secondary | ICD-10-CM | POA: Diagnosis not present

## 2019-07-19 DIAGNOSIS — J189 Pneumonia, unspecified organism: Secondary | ICD-10-CM | POA: Diagnosis not present

## 2019-07-19 DIAGNOSIS — B379 Candidiasis, unspecified: Secondary | ICD-10-CM | POA: Diagnosis not present

## 2019-07-19 LAB — BASIC METABOLIC PANEL
Anion gap: 5 (ref 5–15)
Anion gap: 4 — ABNORMAL LOW (ref 5–15)
BUN: 34 mg/dL — ABNORMAL HIGH (ref 8–23)
BUN: 40 mg/dL — ABNORMAL HIGH (ref 8–23)
CO2: 32 mmol/L (ref 22–32)
CO2: 34 mmol/L — ABNORMAL HIGH (ref 22–32)
Calcium: 8.8 mg/dL — ABNORMAL LOW (ref 8.9–10.3)
Calcium: 8.9 mg/dL (ref 8.9–10.3)
Chloride: 98 mmol/L (ref 98–111)
Chloride: 100 mmol/L (ref 98–111)
Creatinine, Ser: 0.7 mg/dL (ref 0.61–1.24)
Creatinine, Ser: 0.76 mg/dL (ref 0.61–1.24)
GFR calc Af Amer: >60 mL/min (ref >60)
GFR calc Af Amer: >60 mL/min (ref >60)
GFR calc non Af Amer: >60 mL/min (ref >60)
GFR calc non Af Amer: >60 mL/min (ref >60)
Glucose, Bld: 114 mg/dL — ABNORMAL HIGH (ref 70–99)
Glucose, Bld: 167 mg/dL — ABNORMAL HIGH (ref 70–99)
Potassium: 5.8 mmol/L — ABNORMAL HIGH (ref 3.5–5.1)
Potassium: 5.8 mmol/L — ABNORMAL HIGH (ref 3.5–5.1)
Sodium: 135 mmol/L (ref 135–145)
Sodium: 138 mmol/L (ref 135–145)

## 2019-07-19 LAB — BLOOD GAS, ARTERIAL
Acid-Base Excess: 2.6 mmol/L — ABNORMAL HIGH (ref 0.0–2.0)
Acid-Base Excess: 1.9 mmol/L (ref 0.0–2.0)
Acid-Base Excess: 2.2 mmol/L — ABNORMAL HIGH (ref 0.0–2.0)
Bicarbonate: 32.8 mmol/L — ABNORMAL HIGH (ref 20.0–28.0)
Bicarbonate: 32 mmol/L — ABNORMAL HIGH (ref 20.0–28.0)
Bicarbonate: 32.7 mmol/L — ABNORMAL HIGH (ref 20.0–28.0)
Drawn by: 9863
FIO2: 70
FIO2: 70
FIO2: 70
MECHVT: 380 mL
O2 Saturation: 93.9 %
O2 Saturation: 97.3 %
O2 Saturation: 94.9 %
Patient temperature: 99.3
Patient temperature: 99
Patient temperature: 98.6
RATE: 35 resp/min
pCO2 arterial: 95.9 mmHg (ref 32.0–48.0)
pCO2 arterial: 92.3 mmHg (ref 32.0–48.0)
pCO2 arterial: 90.8 mmHg (ref 32.0–48.0)
pH, Arterial: 7.163 — CL (ref 7.350–7.450)
pH, Arterial: 7.168 — CL (ref 7.350–7.450)
pH, Arterial: 7.182 — CL (ref 7.350–7.450)
pO2, Arterial: 81.8 mmHg — ABNORMAL LOW (ref 83.0–108.0)
pO2, Arterial: 108 mmHg (ref 83.0–108.0)
pO2, Arterial: 86.4 mmHg (ref 83.0–108.0)

## 2019-07-19 LAB — CBC
HCT: 33.2 % — ABNORMAL LOW (ref 39.0–52.0)
Hemoglobin: 9.8 g/dL — ABNORMAL LOW (ref 13.0–17.0)
MCH: 30.2 pg (ref 26.0–34.0)
MCHC: 29.5 g/dL — ABNORMAL LOW (ref 30.0–36.0)
MCV: 102.5 fL — ABNORMAL HIGH (ref 80.0–100.0)
Platelets: 396 10*3/uL (ref 150–400)
RBC: 3.24 MIL/uL — ABNORMAL LOW (ref 4.22–5.81)
RDW: 19.1 % — ABNORMAL HIGH (ref 11.5–15.5)
WBC: 31.5 10*3/uL — ABNORMAL HIGH (ref 4.0–10.5)
nRBC: 0.1 % (ref 0.0–0.2)

## 2019-07-19 LAB — GLUCOSE, CAPILLARY
Glucose-Capillary: 110 mg/dL — ABNORMAL HIGH (ref 70–99)
Glucose-Capillary: 116 mg/dL — ABNORMAL HIGH (ref 70–99)
Glucose-Capillary: 92 mg/dL (ref 70–99)
Glucose-Capillary: 88 mg/dL (ref 70–99)
Glucose-Capillary: 185 mg/dL — ABNORMAL HIGH (ref 70–99)
Glucose-Capillary: 189 mg/dL — ABNORMAL HIGH (ref 70–99)
Glucose-Capillary: 157 mg/dL — ABNORMAL HIGH (ref 70–99)
Glucose-Capillary: 151 mg/dL — ABNORMAL HIGH (ref 70–99)

## 2019-07-19 LAB — MAGNESIUM
Magnesium: 2 mg/dL (ref 1.7–2.4)
Magnesium: 2 mg/dL (ref 1.7–2.4)

## 2019-07-19 LAB — ACID FAST SMEAR (AFB, MYCOBACTERIA): Acid Fast Smear: NEGATIVE

## 2019-07-19 LAB — CYTOLOGY - NON PAP

## 2019-07-19 LAB — PNEUMOCYSTIS JIROVECI SMEAR BY DFA: Pneumocystis jiroveci Ag: NEGATIVE

## 2019-07-19 LAB — NOVEL CORONAVIRUS, NAA (HOSP ORDER, SEND-OUT TO REF LAB; TAT 18-24 HRS): SARS-CoV-2, NAA: NOT DETECTED

## 2019-07-19 LAB — PHOSPHORUS
Phosphorus: 4.9 mg/dL — ABNORMAL HIGH (ref 2.5–4.6)
Phosphorus: 3.9 mg/dL (ref 2.5–4.6)

## 2019-07-19 MED ORDER — FENTANYL CITRATE (PF) 100 MCG/2ML IJ SOLN
100.0000 ug | Freq: Once | INTRAMUSCULAR | Status: AC
  Administered 2019-07-19: 100 ug via INTRAVENOUS
  Filled 2019-07-19: qty 2

## 2019-07-19 MED ORDER — SODIUM CHLORIDE 0.9% IV SOLUTION: Freq: Once | INTRAVENOUS | Status: DC

## 2019-07-19 MED ORDER — SODIUM CHLORIDE 0.9 % IV SOLN: 250.0000 mL | INTRAVENOUS | Status: DC

## 2019-07-19 MED ORDER — CISATRACURIUM BESYLATE 20 MG/10ML IV SOLN
10.0000 mg | Freq: Once | INTRAVENOUS | Status: AC
  Administered 2019-07-19: 10 mg via INTRAVENOUS
  Filled 2019-07-19 (×2): qty 10

## 2019-07-19 MED ORDER — MIDAZOLAM HCL 2 MG/2ML IJ SOLN
2.0000 mg | Freq: Once | INTRAMUSCULAR | Status: AC
  Administered 2019-07-19: 07:00:00 2 mg via INTRAVENOUS
  Filled 2019-07-19: qty 2

## 2019-07-19 MED ORDER — SODIUM ZIRCONIUM CYCLOSILICATE 10 G PO PACK
10.0000 g | PACK | Freq: Once | ORAL | Status: AC
  Administered 2019-07-19: 10 g via ORAL
  Filled 2019-07-19: qty 1

## 2019-07-19 MED ORDER — NOREPINEPHRINE 4 MG/250ML-% IV SOLN: 2.0000 ug/min | INTRAVENOUS | Status: DC

## 2019-07-19 MED ORDER — INSULIN ASPART 100 UNIT/ML ~~LOC~~ SOLN
2.0000 [IU] | SUBCUTANEOUS | Status: DC
  Administered 2019-07-19 – 2019-07-31 (×63): 2 [IU] via SUBCUTANEOUS

## 2019-07-19 MED ORDER — SODIUM POLYSTYRENE SULFONATE 15 GM/60ML PO SUSP
30.0000 g | Freq: Once | ORAL | Status: AC
  Administered 2019-07-19: 30 g via ORAL
  Filled 2019-07-19: qty 120

## 2019-07-19 NOTE — Progress Notes (Signed)
Dr. Lucile Shutters called back regarding critical abg. Sedation, breathing and ventilator settings discussed. MD stated he wanted RN to give 100 mcg fentanyl bolus and then increase fentanyl drip to 150 mcg/H and then increase vent rate to 35/min and he will order and abg after.

## 2019-07-19 NOTE — Progress Notes (Signed)
eLink Physician-Brief Progress Note Patient Name: Steve [name] DOB: 10/21/1955 MRN: [id number]   Date of Service  07/19/2019  HPI/Events of Note  K+ 5.8  eICU Interventions  Kayexalate 30 gm via NGT x 1        [place]wo U Ogan 07/19/2019, 6:13 AM

## 2019-07-19 NOTE — Progress Notes (Addendum)
NAME:  Steve [name], MRN:  [id number], DOB:  07-20-1955, LOS: 66 ADMISSION DATE:  07/08/2019, CONSULTATION DATE:  3/29  REFERRING MD:  [name], CHIEF COMPLAINT:  dyspnea   Brief History   64 y/o male, with prior COVID infection 05/2019, admitted with severe acute respiratory failure with hypoxemia in the setting of receiving chemotherapy for Hodgkin's lymphoma.    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 ARDS. Bilateral PTX s/p CTx2 4/09 Driving pressure 20, Peak 32-33, pPlat 30, PEEP 10  Consults:  ID Oncology  Procedures:  Cefepime 3/29 >> 3/31 Vanc 3/29 >> 3/30 Azithromycin 3/29 >> 4/1 Bactrim 3/31 >> 4/5  Fluconazole 3/29 >> Meropenem 3/31 >> Clindamycin 4/5 >> Primaquine 4/5 >>  Significant Diagnostic Tests:   CTA chest 3/29 > no PE, diffuse GGO upper lobes, patchy consolidation in bases, peripheral fibrotic changes noted on prior CT chest still present  ECHO 3/30 > LVEF 60 to 46%, grade 1 diastolic dysfunction.  Mildly dilated LA, normal RV. Trivial MR and TR, otherwise normal valves  Micro Data:  UC 3/29 >> negative  BCx2 3/29 >> negative  PJP 4/2 >> negative  Fungitel 3/29 >> positive Blastomyces Antigen 3/20 >> negative Respiratory Culture 4/4 >> staph epidermis >> S-vanco, clinda BAL 4/8 >>  Fungal (BAL) 4/8 >>  PJP (BAL) 4/8 >> negative  Aspergillus (BAL) 4/8 >>  Antimicrobials:  Cefepime 3/29 >> 3/31 Vanc 3/29 >> 3/30 Azithromycin 3/29 >> 4/1 Fluconazole 3/29 >> Meropenem 3/31 >> Bactrim 3/31 >> Clindamycin 4/5 >>   Interim history/subjective:  Bilateral pneumothoraces late pm 4/8 s/p bilateral chest tube placement  Afebrile  On ARDS protocol, 70% fiO2,  10 PEEP Glucose range 88-123 I/O 1.6L UOP, +2.7L in last 24h  Objective   Blood pressure 118/65, pulse 88, temperature 98.4 F (36.9 C), temperature source Core (Comment), resp. rate (!) 35, height _0  (1.676 m), weight 69 kg, SpO2 94 %. CVP:  [0 mmHg-4 mmHg] 1 mmHg  Vent Mode: PRVC FiO2 (%):  [70 %-80 %] 70 % Set Rate:  [28 bmp-35 bmp] 35 bmp Vt Set:  [380 mL] 380 mL PEEP:  [10 cmH20] 10 cmH20 Plateau Pressure:  [28 cmH20-30 cmH20] 29 cmH20   Intake/Output Summary (Last 24 hours) at 07/19/2019 0803 Last data filed at 07/19/2019 2703 Gross per 24 hour  Intake 4304.99 ml  Output 1720 ml  Net 2584.99 ml   Filed Weights   07/17/19 0225 07/18/19 0409 07/19/19 0447  Weight: 67.9 kg 65.1 kg 69 kg    Examination: General:  Critically ill appearing adult male lying in bed on vent in NAD HEENT: MM pink/dry, sub-q air palpated in neck, ETT Neuro: sedate, pupils 33m / reactive  CV: s1s2 rrr, no m/r/g PULM:  Non-labored on vent, lungs bilaterally diminished  GI: soft, bsx4 active  Extremities: warm/dry, no edema  Skin: no rashes or lesions  Resolved Hospital Problem list     Assessment & Plan:   Acute Hypoxemic Respiratory Failure ARDS Patient post COVID, could very well be infectious pneumonia or drug effect (brentuximab).  Brentuximab carries 5% risk of pulmonary toxicity which occurs weeks into administration, treated with steroids and with holding agent. PJP stain negative 4/5.  Serum fungitel positive. Serum  crypto, urine histo, legionella negative. S/P FOB on 4/8.  Repeat FOB COVID testing negative. -low Vt ventilation 4-8cc/kg -goal plateau pressure <30, driving pressure <27 cm H2O -target PaO2 55-65, titrate PEEP/FiO2 per ARDS protocol  -if P/F ratio <150, consider prone therapy for 16 hours per day -appreciate ID input  -continue solumedrol  -VAP prevention measures   Bilateral Pneumothoraces  -continue chest tube care per protocol  -CT to -20cm suction  -follow  daily CXR with CT's in place  Sedation Needs in setting of Mechanical Ventilation  -PAD protocol, RASS goal -3 with vent synchrony  -propofol + fentanyl gtt  Hypotension  Suspect element of sedation  -continue neosynephrine for MAP >65   Hyperkalemia -repeat lokelma  -follow electrolytes closely   DM2 -SSI, resistant scale  -continue lantus 8 units BID  -reduce novolog TF coverage to 2 units Q4  Chronic anemia -follow CBC, transfuse for Hgb <7% or active bleeding   Oral Thrush -continue fluconazole   Hodgkin's Lymphoma Per ONC, he has good potential to respond to therapy -per ONC  HLD -continue lipitor   Severe Protein Calorie Malnutrition -TF per Nutrition  -maximize protein / nutrition   Physical deconditioning -passive ROM   Best practice:  Diet: TF Pain/Anxiety/Delirium protocol (if indicated): propofol, fentanyl VAP protocol (if indicated): in place  DVT prophylaxis: lovenox GI prophylaxis: PPI  Glucose control: SSI + glargine Mobility: BR Code Status: Full code  Family Communication: Wife [name]) updated via phone 4/9 Disposition: ICU  Labs   CBC: Recent Labs  Lab 07/15/19 0332 07/15/19 1021 07/16/19 0112 07/17/19 0355 07/18/19 0434 07/19/19 0322  WBC 19.1*  --  18.8* 14.8* 20.0* 31.5*  NEUTROABS 16.6*  --  15.9* 11.8*  --   --   HGB 9.3* 10.3* 9.8* 9.6* 11.0* 9.8*  HCT 29.7*  --  31.6* 31.2* 36.5* 33.2*  MCV 93.7  --  92.4 95.4 95.1 102.5*  PLT 318  --  359 311 421* 253    Basic Metabolic Panel: Recent Labs  Lab 07/14/19 0458 07/14/19 0458 07/15/19 0332 07/15/19 0602 07/16/19 0112 07/16/19 0905 07/16/19 1305 07/16/19 1810 07/17/19 0355 07/18/19 0434 07/18/19 1012 07/18/19 1741 07/19/19 0322  NA 131*   < > 129*  --  132*  --   --   --  138 134*  --   --  135  K 5.2*   < > 6.2*   < > 5.9*  5.9*   < > 5.1 4.6 5.0 5.4*  --   --  5.8*  CL 96*   < > 95*  --  97*  --   --   --  100 93*  --   --  98  CO2 26   < > 26  --  26  --    --   --  31 33*  --   --  32  GLUCOSE 174*   < > 226*  --  161*  --   --   --  123* 108*  --   --  114*  BUN 31*   < > 32*  --  39*  --   --   --  40* 35*  --   --  34*  CREATININE 0.89   < > 0.91  --  0.97  --   --   --  0.66 0.64  --   --  0.70  CALCIUM 8.5*   < > 8.4*  --  9.1  --   --   --  9.0 9.3  --   --  8.8*  MG 2.1  --   --   --   --   --   --   --   --   --  2.0 2.0 2.0  PHOS  --   --   --   --   --   --   --   --   --   --  4.7* 5.4* 4.9*   < > = values in this interval not displayed.   GFR: Estimated Creatinine Clearance: 85.3 mL/min (by C-G formula based on SCr of 0.7 mg/dL). Recent Labs  Lab 07/16/19 0112 07/17/19 0355 07/18/19 0434 07/19/19 0322  WBC 18.8* 14.8* 20.0* 31.5*    Liver Function Tests: Recent Labs  Lab 07/15/19 0332 07/16/19 0112 07/17/19 0355  AST 18 37 27  ALT 17 33 36  ALKPHOS 70 94 99  BILITOT 0.4 0.7 0.7  PROT 7.1 7.7 7.3  ALBUMIN 2.2* 2.5* 2.5*   No results for input(s): LIPASE, AMYLASE in the last 168 hours. No results for input(s): AMMONIA in the last 168 hours.  ABG    Component Value Date/Time   PHART 7.168 (LL) 07/19/2019 0521   PCO2ART 92.3 (HH) 07/19/2019 0521   PO2ART 108 07/19/2019 0521   HCO3 32.0 (H) 07/19/2019 0521   TCO2 28 07/08/2019 1331   O2SAT 97.3 07/19/2019 0521     Coagulation Profile: No results for input(s): INR, PROTIME in the last 168 hours.  Cardiac Enzymes: No results for input(s): CKTOTAL, CKMB, CKMBINDEX, TROPONINI in the last 168 hours.  HbA1C: Hgb A1c MFr Bld  Date/Time Value Ref Range Status  07/09/2019 05:10 AM 7.3 (H) 4.8 - 5.6 % Final    Comment:    (NOTE) Pre diabetes:          5.7%-6.4% Diabetes:              >6.4% Glycemic control for   <7.0% adults with diabetes   05/19/2019 03:56 AM 8.3 (H) 4.8 - 5.6 % Final    Comment:    (NOTE) Pre diabetes:          5.7%-6.4% Diabetes:              >6.4% Glycemic control for   <7.0% adults with diabetes     CBG: Recent Labs  Lab  07/18/19 1946 07/19/19 0031 07/19/19 0315 07/19/19 0702 07/19/19 0751  GLUCAP 123* 110* 116* 92 88      Critical care time: 3 minutes    Noe Gens, MSN, NP-C [facility] 07/19/2019, 8:03 AM   Please see [email or web address] for pager details.

## 2019-07-19 NOTE — Progress Notes (Signed)
Arterial blood gas drawn on following settings- PRVC, Vt= 380, RR=35, FiO2= 70%, and PEEP= 10.0.  Results are as follows:       Results for Steve [name], Steve [name] (MRN [id number]) as of 07/19/2019 05:52  Ref. Range 07/19/2019 05:21  FIO2 Unknown 70.00  pH, Arterial Latest Ref Range: 7.350 - 7.450  7.168 (LL)  pCO2 arterial Latest Ref Range: 32.0 - 48.0 mmHg 92.3 (HH)  pO2, Arterial Latest Ref Range: 83.0 - 108.0 mmHg 108  Acid-Base Excess Latest Ref Range: 0.0 - 2.0 mmol/L 1.9  Bicarbonate Latest Ref Range: 20.0 - 28.0 mmol/L 32.0 (H)  O2 Saturation Latest Units: % 97.3  Patient temperature Unknown 99.0

## 2019-07-19 NOTE — Progress Notes (Signed)
Called [facility] and spoke with [facility], RN and made her aware of critical ph of 7.16 and PCO2 95.9, [facility] RN to make Dr. Lucile Shutters aware of critical lab results.

## 2019-07-19 NOTE — Progress Notes (Signed)
eLink Physician-Brief Progress Note Patient Name: Steve [name] DOB: [birth year] MRN: [id number]   Date of Service  07/19/2019  HPI/Events of Note  Hypercapnia in the context of some ventricular dyssynchrony on current rate of 28  eICU Interventions  Fentanyl 100 mcg iv bolus x 1 then increase Fentanyl infusion to 150 mcg / hour, increase RR to 35, ABG in one hour.        [name] Jaianna Nicoll 07/19/2019, 4:03 AM

## 2019-07-19 NOTE — Progress Notes (Signed)
eLink Physician-Brief Progress Note Patient Name: Steve [name] DOB: 1955/10/13 MRN: [id number]   Date of Service  07/19/2019  HPI/Events of Note  Persistent hypercapnia despite RR at 35  eICU Interventions  Will try Versed 2 mg iv x 1 followed by Nimbex 10 mg iv x 1 and obtain an ABG at 7:30 a.m. to see if paralyzing the patient improves minute ventilation and CO2 clearance.        Steve [name] 07/19/2019, 6:29 AM

## 2019-07-19 NOTE — Progress Notes (Signed)
CRITICAL VALUE ALERT  Critical Value:  PH 7.168. CO2- 92.3  Date & Time Notied:  07/19/2019 05:40 am  Provider Notified: [facility] notified   Orders Received/Actions taken: No new orders at current time

## 2019-07-19 NOTE — Progress Notes (Signed)
Subjective:  Patient  intubated   Antibiotics:  Anti-infectives (From admission, onward)   Start     Dose/Rate Route Frequency Ordered Stop   07/18/19 1000  primaquine tablet 30 mg     30 mg Per Tube Daily 07/18/19 0809     07/16/19 1000  primaquine tablet 30 mg  Status:  Discontinued     30 mg Oral Daily 07/16/19 0121 07/18/19 0809   07/15/19 1200  clindamycin (CLEOCIN) IVPB 600 mg     600 mg 100 mL/hr over 30 Minutes Intravenous Every 6 hours 07/15/19 1013     07/15/19 1100  primaquine tablet 30 mg  Status:  Discontinued     30 mg Oral Daily 07/15/19 1013 07/16/19 0120   07/12/19 1000  fluconazole (DIFLUCAN) IVPB 100 mg     100 mg 50 mL/hr over 60 Minutes Intravenous Every 24 hours 07/12/19 0929     07/10/19 1800  meropenem (MERREM) 1 g in sodium chloride 0.9 % 100 mL IVPB     1 g 200 mL/hr over 30 Minutes Intravenous Every 8 hours 07/10/19 1425 07/17/19 0254   07/10/19 0900  sulfamethoxazole-trimethoprim (BACTRIM) 450 mg in dextrose 5 % 500 mL IVPB  Status:  Discontinued     450 mg 352.1 mL/hr over 90 Minutes Intravenous Every 8 hours 07/10/19 0813 07/15/19 1013   07/09/19 1000  fluconazole (DIFLUCAN) tablet 100 mg  Status:  Discontinued     100 mg Oral Daily 07/08/19 1919 07/12/19 0929   07/09/19 0200  vancomycin (VANCOCIN) IVPB 1000 mg/200 mL premix  Status:  Discontinued     1,000 mg 200 mL/hr over 60 Minutes Intravenous Every 12 hours 07/08/19 1930 07/10/19 1017   07/08/19 2200  ceFEPIme (MAXIPIME) 2 g in sodium chloride 0.9 % 100 mL IVPB  Status:  Discontinued     2 g 200 mL/hr over 30 Minutes Intravenous Every 8 hours 07/08/19 1929 07/10/19 1425   07/08/19 2000  fluconazole (DIFLUCAN) tablet 200 mg     200 mg Oral  Once 07/08/19 1919 07/08/19 2005   07/08/19 1800  azithromycin (ZITHROMAX) 500 mg in sodium chloride 0.9 % 250 mL IVPB  Status:  Discontinued     500 mg 250 mL/hr over 60 Minutes Intravenous Every 24 hours 07/08/19 1736 07/12/19 0708   07/08/19  1345  ceFEPIme (MAXIPIME) 2 g in sodium chloride 0.9 % 100 mL IVPB     2 g 200 mL/hr over 30 Minutes Intravenous STAT 07/08/19 1343 07/08/19 1631   07/08/19 1345  vancomycin (VANCOREADY) IVPB 1500 mg/300 mL     1,500 mg 150 mL/hr over 120 Minutes Intravenous STAT 07/08/19 1343 07/08/19 1631      Medications: Scheduled Meds: . atorvastatin  10 mg Per Tube Daily  . chlorhexidine gluconate (MEDLINE KIT)  15 mL Mouth Rinse BID  . Chlorhexidine Gluconate Cloth  6 each Topical Daily  . enoxaparin (LOVENOX) injection  40 mg Subcutaneous Q24H  . feeding supplement (PRO-STAT SUGAR FREE 64)  30 mL Per Tube BID  . feeding supplement (VITAL HIGH PROTEIN)  1,000 mL Per Tube Q24H  . ferrous sulfate  300 mg Per Tube Q breakfast  . free water  200 mL Per Tube Q6H  . insulin aspart  2 Units Subcutaneous Q4H  . insulin aspart  3-9 Units Subcutaneous Q4H  . insulin glargine  8 Units Subcutaneous BID  . ipratropium-albuterol  3 mL Nebulization Q4H  . mouth rinse  15  mL Mouth Rinse 10 times per day  . methylPREDNISolone (SOLU-MEDROL) injection  60 mg Intravenous BID  . multivitamin  15 mL Per Tube Daily  . pantoprazole (PROTONIX) IV  40 mg Intravenous QHS  . primaquine  30 mg Per Tube Daily  . sodium chloride flush  10-40 mL Intracatheter Q12H  . sodium zirconium cyclosilicate  10 g Oral Once   Continuous Infusions: . sodium chloride    . sodium chloride    . clindamycin (CLEOCIN) IV Stopped (07/19/19 0530)  . fentaNYL infusion INTRAVENOUS 150 mcg/hr (07/19/19 1100)  . fluconazole (DIFLUCAN) IV Stopped (07/19/19 1054)  . phenylephrine (NEO-SYNEPHRINE) Adult infusion 255 mcg/min (07/19/19 1100)  . propofol (DIPRIVAN) infusion 35 mcg/kg/min (07/19/19 1215)   PRN Meds:.Place/Maintain arterial line **AND** sodium chloride, acetaminophen (TYLENOL) oral liquid 160 mg/5 mL, acetaminophen, fentaNYL (SUBLIMAZE) injection, hydrALAZINE, lip balm, magic mouthwash w/lidocaine, ondansetron (ZOFRAN) IV, sodium  chloride    Objective: Weight change: 3.9 kg  Intake/Output Summary (Last 24 hours) at 07/19/2019 1323 Last data filed at 07/19/2019 1100 Gross per 24 hour  Intake 4425.55 ml  Output 1310 ml  Net 3115.55 ml   Blood pressure 125/68, pulse 92, temperature 98.1 F (36.7 C), resp. rate (!) 35, height _0  (1.676 m), weight 69 kg, SpO2 94 %. Temp:  [97.2 F (36.2 C)-99.3 F (37.4 C)] 98.1 F (36.7 C) (04/09 1200) Pulse Rate:  [85-105] 92 (04/09 1200) Resp:  [13-36] 35 (04/09 1200) BP: (91-138)/(59-77) 125/68 (04/09 1200) SpO2:  [91 %-100 %] 94 % (04/09 1200) Arterial Line BP: (73-141)/(43-72) 130/51 (04/09 1200) FiO2 (%):  [70 %-80 %] 70 % (04/09 1158) Weight:  [69 kg] 69 kg (04/09 0447)  Physical Exam: General: intubated and sedated HEENT: anicteric sclera, EOMI CVS tachycardic no mgr Chest: , on ventitlator Abdomen: soft non-distended,  Extremities: no edema or deformity noted bilaterally Skin: no rashes Neuro: nonfocal  CBC:    BMET Recent Labs    07/19/19 0322 07/19/19 1045  NA 135 138  K 5.8* 5.8*  CL 98 100  CO2 32 34*  GLUCOSE 114* 167*  BUN 34* 40*  CREATININE 0.70 0.76  CALCIUM 8.8* 8.9     Liver Panel  Recent Labs    07/17/19 0355  PROT 7.3  ALBUMIN 2.5*  AST 27  ALT 36  ALKPHOS 99  BILITOT 0.7       Sedimentation Rate No results for input(s): ESRSEDRATE in the last 72 hours. C-Reactive Protein No results for input(s): CRP in the last 72 hours.  Micro Results: Recent Results (from the past 720 hour(s))  Blood Culture (routine x 2)     Status: None   Collection Time: 07/08/19  1:20 PM   Specimen: BLOOD  Result Value Ref Range Status   Specimen Description   Final    BLOOD RIGHT ANTECUBITAL Performed at [facility] [address], [place], [place] [address]    Special Requests   Final    BOTTLES DRAWN AEROBIC AND ANAEROBIC Blood Culture adequate volume Performed at [facility]  [address], [place], [place] [address]    Culture   Final    NO GROWTH 5 DAYS Performed at [facility], [place] [address], [place], [place] [address]    Report Status 07/13/2019 FINAL  Final  Blood Culture (routine x 2)     Status: None   Collection Time: 07/08/19  1:25 PM   Specimen: BLOOD  Result Value Ref Range Status  Specimen Description   Final    BLOOD RIGHT ANTECUBITAL Performed at [facility] [address] [place], [place] [address]    Special Requests   Final    BOTTLES DRAWN AEROBIC AND ANAEROBIC Blood Culture adequate volume Performed at [facility] [address], [place], [place] [address]    Culture   Final    NO GROWTH 5 DAYS Performed at [facility], [place] [address] [place], [place] [address]    Report Status 07/13/2019 FINAL  Final  Urine culture     Status: None   Collection Time: 07/08/19  6:30 PM   Specimen: In/Out Cath Urine  Result Value Ref Range Status   Specimen Description   Final    IN/OUT CATH URINE Performed at [facility] [address], [place], [place] [address]    Special Requests   Final    NONE Performed at [facility], [place] [address], [place], [place] [address]    Culture   Final    NO GROWTH Performed at [facility], [place] [address] [place], [place] [address]    Report Status 07/09/2019 FINAL  Final  MRSA PCR Screening     Status: None   Collection Time: 07/08/19  9:29 PM   Specimen: Nasopharyngeal  Result Value Ref Range Status   MRSA by PCR NEGATIVE NEGATIVE Final    Comment:        The GeneXpert MRSA Assay (FDA approved for NASAL specimens only), is one component of a comprehensive MRSA colonization surveillance program. It is not intended to diagnose MRSA infection nor to guide or monitor treatment for MRSA infections. Performed at [facility], [place] [address] [place], [place] [address]     Blastomyces Antigen     Status: None   Collection Time: 07/09/19  6:33 PM   Specimen: Blood  Result Value Ref Range Status   Blastomyces Antigen None Detected None Detected ng/mL Final    Comment: (NOTE) Results reported as ng/mL in 0.2 - 14.7 ng/mL range Results above the limit of detection but below 0.2 ng/mL are reported as 'Positive, Below the Limit of Quantification' Results above 14.7 ng/mL are reported as 'Positive, Above the Limit of Quantification'    Specimen Type SERUM  Final    Comment: (NOTE) Performed At: [place], [place] [id number] Noralyn Pick MD NO:[id number]   Pneumocystis smear by DFA     Status: None   Collection Time: 07/12/19  2:26 PM   Specimen: Expectorated Sputum; Respiratory  Result Value Ref Range Status   Specimen Source-PJSRC EXPECTORATED SPUTUM  Final   Pneumocystis jiroveci Ag NEGATIVE  Final    Comment: Performed at [facility] Performed at [facility] [address] [place], [place] [address]   Expectorated sputum assessment w rflx to resp cult     Status: None   Collection Time: 07/14/19 11:56 AM   Specimen: Sputum  Result Value Ref Range Status   Specimen Description SPUTUM  Final   Special Requests Immunocompromised  Final   Sputum evaluation   Final    THIS SPECIMEN IS ACCEPTABLE FOR SPUTUM CULTURE Performed at [facility], [place] [address] [place], [place] [address]    Report Status 07/14/2019 FINAL  Final  Culture, respiratory     Status: None   Collection Time: 07/14/19 11:56 AM   Specimen: SPU  Result Value Ref Range Status   Specimen Description   Final    SPUTUM Performed at [facility] [address], [place], [place] [address]    Special Requests   Final    Immunocompromised Reflexed from [id number] Performed at [facility], [place] [address], [place], [place] [address]    Gram Stain    Final    NO WBC SEEN NO ORGANISMS SEEN Performed at [facility], [place] [address] [place], [place] [address]    Culture FEW STAPHYLOCOCCUS EPIDERMIDIS  Final   Report Status 07/18/2019 FINAL  Final   Organism ID, Bacteria STAPHYLOCOCCUS EPIDERMIDIS  Final      Susceptibility   Staphylococcus epidermidis - MIC*    CIPROFLOXACIN <=0.5 SENSITIVE Sensitive     ERYTHROMYCIN >=8 RESISTANT Resistant     GENTAMICIN <=0.5 SENSITIVE Sensitive     OXACILLIN >=4 RESISTANT Resistant     TETRACYCLINE <=1 SENSITIVE Sensitive     VANCOMYCIN 2 SENSITIVE Sensitive     TRIMETH/SULFA 160 RESISTANT Resistant     CLINDAMYCIN <=0.25 SENSITIVE Sensitive     RIFAMPIN <=0.5 SENSITIVE Sensitive     Inducible Clindamycin NEGATIVE Sensitive     * FEW STAPHYLOCOCCUS EPIDERMIDIS  Culture, respiratory (non-expectorated)     Status: None (Preliminary result)   Collection Time: 07/18/19  8:42 AM   Specimen: Bronchoalveolar Lavage; Respiratory  Result Value Ref Range Status   Specimen Description   Final    BRONCHIAL ALVEOLAR LAVAGE Performed at [facility] [address], [place], [place] [address]    Special Requests   Final    NONE Performed at [facility], [place] [address] [place], [place] [address]    Gram Stain   Final    ABUNDANT WBC PRESENT, PREDOMINANTLY MONONUCLEAR FEW YEAST    Culture   Final    CULTURE REINCUBATED FOR BETTER GROWTH Performed at [facility], [place] [address], [place], [place] [address]    Report Status PENDING  Incomplete  Novel Coronavirus, NAA (Hosp order, Send-out to Ref Lab; TAT 18-24 hrs     Status: None   Collection Time: 07/18/19  8:42 AM  Result Value Ref Range Status   SARS-CoV-2, NAA NOT DETECTED NOT DETECTED Final    Comment: (NOTE) This nucleic acid amplification test was developed and its performance characteristics determined by [employer]. Nucleic acid amplification tests include RT-PCR and TMA.  This test has not been FDA cleared or approved. This test has been authorized by FDA under an Emergency Use Authorization (EUA). This test is only authorized for the duration of time the declaration that circumstances exist justifying the authorization of the emergency use of in vitro diagnostic tests for detection of SARS-CoV-2 virus and/or diagnosis of COVID-19 infection under section 564(b)(1) of the Act, 21 U.S.C. 536UYQ-0(H) (1), unless the authorization is terminated or revoked sooner. When diagnostic testing is negative, the possibility of a false negative result should be considered in the context of a patient's recent exposures and the presence of clinical signs and symptoms consistent with COVID-19. An individual without symptoms of COVID- 19 and who is not shedding SARS-CoV-2  virus would expect to have a negative (not detected) result in this assay. Performed At: [facility] [address] [place], [place] [id number] Rush Farmer MD OV:[id number]    [place]  Final    Comment: Performed at [facility] [address] [place], [place]  [address]  Pneumocystis smear by DFA     Status: None   Collection Time: 07/18/19  8:46 AM   Specimen: Bronchoalveolar Lavage; Respiratory  Result Value Ref Range Status   Specimen Source-PJSRC BRONCHIAL ALVEOLAR LAVAGE  Final   Pneumocystis jiroveci Ag NEGATIVE  Final    Comment: Performed at [facility] Performed at [facility] [address] [place], [place] [address]     Studies/Results: DG Abd 1 View  Result Date: 07/18/2019 CLINICAL DATA:  OG tube position EXAM: ABDOMEN - 1 VIEW COMPARISON:  08/31/2019 FINDINGS: Coarse interstitial markings are noted at the lung bases. A central venous catheter terminates in the right atrium. Interval placement of a gastric tube, tip in the antrum or pylorus. Increasing small bowel distension since  the plain film evaluation from July 01, 2019. Visualized skeletal structures are unremarkable. IMPRESSION: 1. Interval placement of a gastric tube, tip in the antrum or pylorus. 2. Increasing small bowel distension since the plain film evaluation from July 01, 2019. Findings may represent ileus or developing obstruction. Follow-up radiography may be helpful. Electronically Signed   By: Zetta Bills M.D.   On: 07/18/2019 08:33   DG CHEST PORT 1 VIEW  Result Date: 07/19/2019 CLINICAL DATA:  Hypoxia EXAM: PORTABLE CHEST 1 VIEW COMPARISON:  July 18, 2019 FINDINGS: Endotracheal tube tip is 4.9 cm above the carina. Nasogastric tube tip and side port are below the diaphragm. There is a chest tube on each side. Port-A-Cath tip is in the superior vena cava near the cavoatrial junction. The previously noted pneumothorax on the right is not appreciable currently. Small left apical pneumothorax is stable. Extensive subcutaneous air is noted bilaterally. There is questionable pneumomediastinum as well, grossly stable. Ill-defined airspace opacity is noted bilaterally. No new opacity evident. Heart size and pulmonary vascularity are normal. No adenopathy. No bone lesions. IMPRESSION: Tube and catheter positions as described. Small pneumothorax on the left. Previously noted pneumothorax on the right not convincingly seen. There is extensive subcutaneous air with suspected pneumomediastinum, similar to 1 day prior. Ill-defined opacity bilaterally likely represents multifocal pneumonia. A degree of superimposed pulmonary edema cannot be excluded. Cardiac silhouette is stable. Electronically Signed   By: Lowella Grip III M.D.   On: 07/19/2019 07:58   DG CHEST PORT 1 VIEW  Result Date: 07/18/2019 CLINICAL DATA:  Bilateral chest tube placement EXAM: PORTABLE CHEST 1 VIEW COMPARISON:  Radiograph 2 hours ago. FINDINGS: Placement of right-sided chest tube with tip in the mid lung directed towards the apex. Moderate-sized  right pneumothorax is unchanged or minimally increased in the interim. Placement of left-sided chest tube with tip in the mid lung directed towards the apex. Left pneumothorax has decreased in the interim. Extensive subcutaneous emphysema again seen in the chest wall and supraclavicular soft tissues. Lucency abutting the left heart border may be pneumothorax or pneumomediastinum. Heterogeneous bilateral lung opacities are unchanged. Support apparatus unchanged. IMPRESSION: 1. Placement of bilateral chest tubes. Right pneumothorax is unchanged or minimally increased in the interim. Left pneumothorax has decreased. 2. Extensive subcutaneous emphysema in the chest wall and supraclavicular soft tissues. 3. Lucency abutting the left heart border may be pneumothorax or pneumomediastinum. 4. Stable support apparatus and bilateral heterogeneous lung opacities. Electronically Signed   By: Keith Rake M.D.   On: 07/18/2019 19:22   DG CHEST PORT 1 VIEW  Result Date: 07/18/2019 CLINICAL DATA:  Subcutaneous air. EXAM: PORTABLE CHEST 1 VIEW COMPARISON:  Radiograph earlier today. FINDINGS: Development  of extensive bilateral subcutaneous emphysema involving the chest wall. There are bilateral pneumothoraces at the apices each measuring approximately 3 cm from the apex. Endotracheal tube, enteric tube, and left chest port remains in place. Unchanged heart size and mediastinal contours. Heterogeneous bilateral lung opacities are unchanged from earlier today. IMPRESSION: 1. Development of extensive subcutaneous emphysema involving right and left chest wall and supraclavicular soft tissues. 2. Bilateral pneumothoraces at the apices each measuring approximately 3 cm from the apex. 3. Unchanged heterogeneous bilateral lung opacities from earlier this day. Critical Value/emergent results were discussed by telephone at the time of interpretation on 07/18/2019 at 5:08 pm with Dr Simonne Maffucci , who verbally acknowledged these  results. Electronically Signed   By: Keith Rake M.D.   On: 07/18/2019 17:23   DG CHEST PORT 1 VIEW  Result Date: 07/18/2019 CLINICAL DATA:  Status post intubation. EXAM: PORTABLE CHEST 1 VIEW COMPARISON:  Earlier film, same date. FINDINGS: The endotracheal tube is 3.7 cm above the carina. The NG tube is coursing down the esophagus and into the stomach. Left IJ power port is stable. Persistent diffuse interstitial and airspace process in the lungs. No definite pleural effusions. No pneumothorax. IMPRESSION: 1. The endotracheal tube is 3.7 cm above the carina. 2. Persistent diffuse interstitial and airspace process. Electronically Signed   By: Marijo Sanes M.D.   On: 07/18/2019 05:57   DG CHEST PORT 1 VIEW  Result Date: 07/18/2019 CLINICAL DATA:  Hypoxia EXAM: PORTABLE CHEST 1 VIEW COMPARISON:  07/16/2019 FINDINGS: The previously demonstrated pneumomediastinum has improved. The previously demonstrated subcutaneous gas has improved. The left-sided Port-A-Cath is stable in positioning. Bilateral airspace opacities are again noted. There is no definite pneumothorax. There may be bilateral pleural effusions. The heart size is stable. IMPRESSION: 1. Interval improvement in the previously demonstrated pneumomediastinum and subcutaneous emphysema. 2. No convincing pneumothorax. 3. Otherwise, no significant interval change. Electronically Signed   By: Constance Holster M.D.   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 male with Hodgkin's lymphoma on  chemotherapy, with Covid pneumonia in February and supplemental oxygen requirements afterwards who presents with acute on chronic respiratory failure with bilateral diffuse pulmonary infiltrates.  Serum crypto ag, urine histo ag negative, legionella ag negative Fungitell is 231, galactomannan -, PCP DFA on sputum but on therapy is negative  He has now been intubated and has CT for pneumothoraces  [name] and Dr Baxter Flattery were sufficiently concerned that this could be still COVID 19 and this was sent from bronchoscopy with BAL along with multiple other studies  1..Bilateral diffuse pulmonary infiltrates:  Despite the negative DFA for PCP sputum and BAL  it remains high in my differential  And he was already on therapy long before it was sent Fungitell +, COVID negative. I would ignore the Coag Neg staph in sputum and yeast on BAL--> unless they come back as a dimorphic organisms such as crypto, blasto or histo  Continue   clindamycin and primaquine (G6PD negative)  I would complete 21  day course (if he survives of PCP treatment including steroids)  If he survives would put on prophylactic bactrim DS daily    2 Thrush: on fluconazole and would finish 2 weeks of therapy   We will sign off for now, please call with further questions.    LOS: 11 days   Alcide Evener 07/19/2019, 1:23 PM

## 2019-07-19 NOTE — Progress Notes (Signed)
RN called Elink RN and Dr. Zola Button to assess pts swelling in neck and upper shoulders.    Titrating neosynephrine and propoful as tolerated.

## 2019-07-20 ENCOUNTER — Inpatient Hospital Stay (HOSPITAL_COMMUNITY): Payer: BC Managed Care – PPO

## 2019-07-20 DIAGNOSIS — J9601 Acute respiratory failure with hypoxia: Secondary | ICD-10-CM | POA: Diagnosis not present

## 2019-07-20 LAB — GLUCOSE, CAPILLARY
Glucose-Capillary: 132 mg/dL — ABNORMAL HIGH (ref 70–99)
Glucose-Capillary: 139 mg/dL — ABNORMAL HIGH (ref 70–99)
Glucose-Capillary: 170 mg/dL — ABNORMAL HIGH (ref 70–99)
Glucose-Capillary: 171 mg/dL — ABNORMAL HIGH (ref 70–99)
Glucose-Capillary: 186 mg/dL — ABNORMAL HIGH (ref 70–99)
Glucose-Capillary: 97 mg/dL (ref 70–99)

## 2019-07-20 LAB — COMPREHENSIVE METABOLIC PANEL
ALT: 40 U/L (ref 0–44)
AST: 31 U/L (ref 15–41)
Albumin: 2.4 g/dL — ABNORMAL LOW (ref 3.5–5.0)
Alkaline Phosphatase: 85 U/L (ref 38–126)
Anion gap: 5 (ref 5–15)
BUN: 45 mg/dL — ABNORMAL HIGH (ref 8–23)
CO2: 37 mmol/L — ABNORMAL HIGH (ref 22–32)
Calcium: 8.9 mg/dL (ref 8.9–10.3)
Chloride: 100 mmol/L (ref 98–111)
Creatinine, Ser: 0.58 mg/dL — ABNORMAL LOW (ref 0.61–1.24)
GFR calc Af Amer: >60 mL/min (ref >60)
GFR calc non Af Amer: >60 mL/min (ref >60)
Glucose, Bld: 147 mg/dL — ABNORMAL HIGH (ref 70–99)
Potassium: 5.4 mmol/L — ABNORMAL HIGH (ref 3.5–5.1)
Sodium: 142 mmol/L (ref 135–145)
Total Bilirubin: 0.5 mg/dL (ref 0.3–1.2)
Total Protein: 7 g/dL (ref 6.5–8.1)

## 2019-07-20 LAB — CBC
HCT: 32.6 % — ABNORMAL LOW (ref 39.0–52.0)
Hemoglobin: 9.3 g/dL — ABNORMAL LOW (ref 13.0–17.0)
MCH: 29.8 pg (ref 26.0–34.0)
MCHC: 28.5 g/dL — ABNORMAL LOW (ref 30.0–36.0)
MCV: 104.5 fL — ABNORMAL HIGH (ref 80.0–100.0)
Platelets: 303 10*3/uL (ref 150–400)
RBC: 3.12 MIL/uL — ABNORMAL LOW (ref 4.22–5.81)
RDW: 19.7 % — ABNORMAL HIGH (ref 11.5–15.5)
WBC: 33.3 10*3/uL — ABNORMAL HIGH (ref 4.0–10.5)
nRBC: 0.2 % (ref 0.0–0.2)

## 2019-07-20 LAB — ASPERGILLUS ANTIGEN, BAL/SERUM: Aspergillus Ag, BAL/Serum: 0.13 Index (ref 0.00–0.49)

## 2019-07-20 MED ORDER — PANTOPRAZOLE SODIUM 40 MG PO PACK
40.0000 mg | PACK | ORAL | Status: DC
  Administered 2019-07-20 – 2019-08-28 (×38): 40 mg
  Filled 2019-07-20 (×38): qty 20

## 2019-07-20 MED ORDER — CLINDAMYCIN PALMITATE HCL 75 MG/5ML PO SOLR
600.0000 mg | Freq: Four times a day (QID) | ORAL | Status: DC
  Administered 2019-07-20 – 2019-07-21 (×4): 600 mg
  Filled 2019-07-20 (×6): qty 40

## 2019-07-20 MED ORDER — IPRATROPIUM-ALBUTEROL 0.5-2.5 (3) MG/3ML IN SOLN: 3.0000 mL | RESPIRATORY_TRACT | Status: DC | PRN

## 2019-07-20 NOTE — Progress Notes (Signed)
eLink Physician-Brief Progress Note Patient Name: Steve [name] DOB: 1955/12/23 MRN: [id number]   Date of Service  07/20/2019  HPI/Events of Note  Pt with subjective worsening of SQ air around the neck.  eICU Interventions  Portable CXR: No evidence of worsening pneumothorax.        Lex Linhares U Tyshea Imel 07/20/2019, 2:06 AM

## 2019-07-20 NOTE — Progress Notes (Signed)
CRITICAL VALUE ALERT  Critical Value: K+ 5.4  Date & Time Notied:  07/20/19  Provider Notified: Dr. Halford Chessman  Orders Received/Actions taken: No new orders

## 2019-07-20 NOTE — Progress Notes (Signed)
NAME:  Steve [name], MRN:  [id number], DOB:  Oct 19, 1955, LOS: 3 ADMISSION DATE:  07/08/2019, CONSULTATION DATE:  3/29  REFERRING MD:  [name], CHIEF COMPLAINT:  dyspnea   Brief History   64 y/o male, with prior COVID infection 05/2019, admitted with severe acute respiratory failure with hypoxemia in the setting of receiving chemotherapy for Hodgkin's lymphoma.    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 ARDS. Bilateral PTX s/p CTx2  Consults:  ID >> s/o 4/09 Oncology >> s/o 4/05  Procedures:  Cefepime 3/29 >> 3/31 Vanc 3/29 >> 3/30 Azithromycin 3/29 >> 4/1 Bactrim 3/31 >> 4/5  Fluconazole 3/29 >> Meropenem 3/31 >> Clindamycin 4/5 >> Primaquine 4/5 >>  Significant Diagnostic Tests:   CTA chest 3/29 > no PE, diffuse GGO upper lobes, patchy consolidation in bases, peripheral fibrotic changes noted on prior CT chest still present  ECHO 3/30 > LVEF 60 to 10%, grade 1 diastolic dysfunction.  Mildly dilated LA, normal RV. Trivial MR and TR, otherwise normal valves  Micro Data:  UC 3/29 >> negative  BCx2 3/29 >> negative  PJP 4/2 >> negative  Fungitel 3/29 >> positive Blastomyces Antigen 3/20 >> negative Respiratory Culture 4/4 >> staph epidermis >> S-vanco, clinda BAL 4/8 >>  Fungal (BAL) 4/8 >>  PJP (BAL) 4/8 >> negative  Aspergillus (BAL) 4/8 >>  Antimicrobials:  Cefepime 3/29 >> 3/31 Vanc 3/29 >> 3/30 Azithromycin 3/29 >> 4/1 Fluconazole 3/29 >> Meropenem 3/31 >> 4/6 Bactrim 3/31 >> 4/4 Clindamycin 4/5 >>  Primaquine 4/5 >>   Interim history/subjective:  Increased SQ emphysema.  Remains on pressors.  Objective   Blood pressure 127/66, pulse (!) 109, temperature 99.1  F (37.3 C), resp. rate (!) 35, height _0  (1.676 m), weight 70.4 kg, SpO2 96 %.    Vent Mode: PRVC FiO2 (%):  [70 %] 70 % Set Rate:  [35 bmp] 35 bmp Vt Set:  [380 mL] 380 mL PEEP:  [10 cmH20-15 cmH20] 15 cmH20 Plateau Pressure:  [30 cmH20-41 cmH20] 31 cmH20   Intake/Output Summary (Last 24 hours) at 07/20/2019 0811 Last data filed at 07/20/2019 0600 Gross per 24 hour  Intake 1631.06 ml  Output 2299 ml  Net -667.94 ml   Filed Weights   07/18/19 0409 07/19/19 0447 07/20/19 0500  Weight: 65.1 kg 69 kg 70.4 kg    Examination:  General - sedated Eyes - pupils reactive ENT - ETT in place Cardiac - regular, tachycardic Chest - b/l crackles Abdomen - soft, non tender, + bowel sounds Extremities - 1+ edema Skin - no rashes Neuro - RASS -3  Resolved Hospital Problem list     Assessment & Plan:   Acute hypoxic/hypercapnic respiratory failure with ARDS. - differential includes post-COVID pneumonitis, drug reaction from brentuximab, infectious process - Brentuximab carries 5% risk of pulmonary toxicity which occurs weeks into administration, treated with steroids and with holding agent - PJP stain negative 4/5.  Serum fungitel positive. Serum crypto, urine histo, legionella negative. S/P FOB on 4/8.  Repeat FOB COVID testing negative. - goal SpO2 88 to 95%, plateau pressure < 30, driving pressure < 15 - allow for permissive hypercapnia; goal pH > 7.20 - f/u ABG - continue solumedrol 60 mg bid -  day 6/21 of clindamycin and primaquine; would then need daily bactrim DS prophylaxis - prn BDs  Thrush. - day 9/14 of fluconazole  Bilateral spontaneous pneumothoraces with subcutaneous emphysema. - continue b/l chest tubes to -20 cm suction - f/u CXR  Acute metabolic encephalopathy 2nd to hypoxia/hypercapnia. - RASS goal -3  Hypotension likely from sedation. - pressors for goal MAP > 65   Hyperkalemia in setting of acidosis. - received lokelma 4/09 - f/u BMET  DM type II  poorly controlled with steroid induced hyperglycemia. - resistant scale SSI with lantus and tube feed coverage  Anemia of critical illness and chronic disease. Hx of Hodgkin's lymphoma. - f/u CBC - transfuse for Hb < 7 or significant bleeding  HLD. -continue lipitor   Severe Protein Calorie Malnutrition. - continue tube feeds  Physical deconditioning. - passive ROM   Best practice:  Diet: TF DVT prophylaxis: lovenox GI prophylaxis: PPI  Mobility: BR Code Status: Full code  Family Communication: Wife [name]) updated via phone 4/9 Disposition: ICU  Labs    CMP Latest Ref Rng & Units 07/20/2019 07/19/2019 07/19/2019  Glucose 70 - 99 mg/dL 147(H) 167(H) 114(H)  BUN 8 - 23 mg/dL 45(H) 40(H) 34(H)  Creatinine 0.61 - 1.24 mg/dL 0.58(L) 0.76 0.70  Sodium 135 - 145 mmol/L 142 138 135  Potassium 3.5 - 5.1 mmol/L 5.4(H) 5.8(H) 5.8(H)  Chloride 98 - 111 mmol/L 100 100 98  CO2 22 - 32 mmol/L 37(H) 34(H) 32  Calcium 8.9 - 10.3 mg/dL 8.9 8.9 8.8(L)  Total Protein 6.5 - 8.1 g/dL 7.0 - -  Total Bilirubin 0.3 - 1.2 mg/dL 0.5 - -  Alkaline Phos 38 - 126 U/L 85 - -  AST 15 - 41 U/L 31 - -  ALT 0 - 44 U/L 40 - -    CBC Latest Ref Rng & Units 07/20/2019 07/19/2019 07/18/2019  WBC 4.0 - 10.5 K/uL 33.3(H) 31.5(H) 20.0(H)  Hemoglobin 13.0 - 17.0 g/dL 9.3(L) 9.8(L) 11.0(L)  Hematocrit 39.0 - 52.0 % 32.6(L) 33.2(L) 36.5(L)  Platelets 150 - 400 K/uL 303 396 421(H)    ABG    Component Value Date/Time   PHART 7.182 (LL) 07/19/2019 0825   PCO2ART 90.8 (HH) 07/19/2019 0825   PO2ART 86.4 07/19/2019 0825   HCO3 32.7 (H) 07/19/2019 0825   TCO2 28 07/08/2019 1331   O2SAT 94.9 07/19/2019 0825    CBG (last 3)  Recent Labs    07/19/19 1952 07/19/19 2309 07/20/19 0402  GLUCAP 157* 151* 132*    Critical care time: 37 minutes     Chesley Mires, MD [place] Pager - [phone] 07/20/2019, 8:32 AM

## 2019-07-20 NOTE — Progress Notes (Signed)
eLink Physician-Brief Progress Note Patient Name: Steve [name] DOB: 1956/01/27 MRN: [id number]   Date of Service  07/20/2019  HPI/Events of Note  Increase in subcutaneous emphysema around patient's neck?  eICU Interventions  a.m portable CXR requested as a stat now to r/o evolving pneumothorax.        [name] 07/20/2019, 12:55 AM

## 2019-07-21 ENCOUNTER — Inpatient Hospital Stay (HOSPITAL_COMMUNITY): Payer: BC Managed Care – PPO

## 2019-07-21 DIAGNOSIS — J9601 Acute respiratory failure with hypoxia: Secondary | ICD-10-CM | POA: Diagnosis not present

## 2019-07-21 DIAGNOSIS — J189 Pneumonia, unspecified organism: Secondary | ICD-10-CM | POA: Diagnosis not present

## 2019-07-21 LAB — CBC
HCT: 27.7 % — ABNORMAL LOW (ref 39.0–52.0)
Hemoglobin: 7.9 g/dL — ABNORMAL LOW (ref 13.0–17.0)
MCH: 29.4 pg (ref 26.0–34.0)
MCHC: 28.5 g/dL — ABNORMAL LOW (ref 30.0–36.0)
MCV: 103 fL — ABNORMAL HIGH (ref 80.0–100.0)
Platelets: 204 10*3/uL (ref 150–400)
RBC: 2.69 MIL/uL — ABNORMAL LOW (ref 4.22–5.81)
RDW: 19.7 % — ABNORMAL HIGH (ref 11.5–15.5)
WBC: 11.8 10*3/uL — ABNORMAL HIGH (ref 4.0–10.5)
nRBC: 0 % (ref 0.0–0.2)

## 2019-07-21 LAB — BLOOD GAS, ARTERIAL
Acid-Base Excess: 9.7 mmol/L — ABNORMAL HIGH (ref 0.0–2.0)
Bicarbonate: 38.9 mmol/L — ABNORMAL HIGH (ref 20.0–28.0)
Drawn by: 223631
FIO2: 60
MECHVT: 0.39 mL
O2 Saturation: 96.1 %
PEEP: 10 cmH2O
Patient temperature: 98.4
RATE: 35 resp/min
pCO2 arterial: 88.4 mmHg (ref 32.0–48.0)
pH, Arterial: 7.265 — ABNORMAL LOW (ref 7.350–7.450)
pO2, Arterial: 89.7 mmHg (ref 83.0–108.0)

## 2019-07-21 LAB — CULTURE, RESPIRATORY W GRAM STAIN

## 2019-07-21 LAB — GLUCOSE, CAPILLARY
Glucose-Capillary: 107 mg/dL — ABNORMAL HIGH (ref 70–99)
Glucose-Capillary: 131 mg/dL — ABNORMAL HIGH (ref 70–99)
Glucose-Capillary: 138 mg/dL — ABNORMAL HIGH (ref 70–99)
Glucose-Capillary: 142 mg/dL — ABNORMAL HIGH (ref 70–99)
Glucose-Capillary: 163 mg/dL — ABNORMAL HIGH (ref 70–99)
Glucose-Capillary: 164 mg/dL — ABNORMAL HIGH (ref 70–99)

## 2019-07-21 LAB — BASIC METABOLIC PANEL
Anion gap: 3 — ABNORMAL LOW (ref 5–15)
BUN: 45 mg/dL — ABNORMAL HIGH (ref 8–23)
CO2: 42 mmol/L — ABNORMAL HIGH (ref 22–32)
Calcium: 8.9 mg/dL (ref 8.9–10.3)
Chloride: 102 mmol/L (ref 98–111)
Creatinine, Ser: 0.45 mg/dL — ABNORMAL LOW (ref 0.61–1.24)
GFR calc Af Amer: >60 mL/min (ref >60)
GFR calc non Af Amer: >60 mL/min (ref >60)
Glucose, Bld: 130 mg/dL — ABNORMAL HIGH (ref 70–99)
Potassium: 5.2 mmol/L — ABNORMAL HIGH (ref 3.5–5.1)
Sodium: 147 mmol/L — ABNORMAL HIGH (ref 135–145)

## 2019-07-21 LAB — TRIGLYCERIDES: Triglycerides: 85 mg/dL (ref <150)

## 2019-07-21 MED ORDER — CLINDAMYCIN PHOSPHATE 600 MG/50ML IV SOLN
600.0000 mg | Freq: Four times a day (QID) | INTRAVENOUS | Status: DC
  Administered 2019-07-21 – 2019-07-22 (×4): 600 mg via INTRAVENOUS
  Filled 2019-07-21 (×4): qty 50

## 2019-07-21 MED ORDER — FREE WATER
250.0000 mL | Freq: Four times a day (QID) | Status: DC
  Administered 2019-07-21 – 2019-07-27 (×25): 250 mL

## 2019-07-21 NOTE — Progress Notes (Signed)
eLink Physician-Brief Progress Note Patient Name: Steve [name] DOB: November 19, 1955 MRN: [id number]   Date of Service  07/21/2019  HPI/Events of Note  Na+ 147, K+ 5.2  eICU Interventions  Free water increased to 250 ml Q 6 hours        Frederik Pear 07/21/2019, 5:16 AM

## 2019-07-21 NOTE — Progress Notes (Signed)
NAME:  Steve [name], MRN:  [id number], DOB:  1955/04/13, LOS: 51 ADMISSION DATE:  07/08/2019, CONSULTATION DATE:  3/29  REFERRING MD:  [name], CHIEF COMPLAINT:  dyspnea   Brief History   64 y/o male, with prior COVID infection 05/2019, admitted with severe acute respiratory failure with hypoxemia in the setting of receiving chemotherapy for Hodgkin's lymphoma.    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 ARDS. Bilateral PTX s/p CTx2  Consults:  ID >> s/o 4/09 Oncology >> s/o 4/05  Procedures:  Cefepime 3/29 >> 3/31 Vanc 3/29 >> 3/30 Azithromycin 3/29 >> 4/1 Bactrim 3/31 >> 4/5  Fluconazole 3/29 >> Meropenem 3/31 >> Clindamycin 4/5 >> Primaquine 4/5 >>  Significant Diagnostic Tests:   CTA chest 3/29 > no PE, diffuse GGO upper lobes, patchy consolidation in bases, peripheral fibrotic changes noted on prior CT chest still present  ECHO 3/30 > LVEF 60 to 47%, grade 1 diastolic dysfunction.  Mildly dilated LA, normal RV. Trivial MR and TR, otherwise normal valves  Micro Data:  UC 3/29 >> negative  BCx2 3/29 >> negative  PJP 4/2 >> negative  Fungitel 3/29 >> positive Blastomyces Antigen 3/20 >> negative Respiratory Culture 4/4 >> staph epidermis >> S-vanco, clinda BAL 4/8 >>  Fungal (BAL) 4/8 >>  PJP (BAL) 4/8 >> negative  Aspergillus (BAL) 4/8 >>  Antimicrobials:  Cefepime 3/29 >> 3/31 Vanc 3/29 >> 3/30 Azithromycin 3/29 >> 4/1 Fluconazole 3/29 >> Meropenem 3/31 >> 4/6 Bactrim 3/31 >> 4/4 Clindamycin 4/5 >>  Primaquine 4/5 >>   Interim history/subjective:  Remains on increased PEEP and FiO2.  Objective   Blood pressure 118/68, pulse (!) 106, temperature 97.9 F (36.6  C), temperature source Bladder, resp. rate 20, height _0  (1.676 m), weight 73.5 kg, SpO2 (!) 88 %.    Vent Mode: PRVC FiO2 (%):  [50 %-60 %] 50 % Set Rate:  [35 bmp] 35 bmp Vt Set:  [380 mL] 380 mL PEEP:  [10 cmH20] 10 cmH20 Plateau Pressure:  [30 cmH20-33 cmH20] 33 cmH20   Intake/Output Summary (Last 24 hours) at 07/21/2019 0947 Last data filed at 07/21/2019 0800 Gross per 24 hour  Intake 764.93 ml  Output 2657 ml  Net -1892.07 ml   Filed Weights   07/19/19 0447 07/20/19 0500 07/21/19 0500  Weight: 69 kg 70.4 kg 73.5 kg    Examination:  General - sedated Eyes - pupils reactive ENT - ETT in place Cardiac - regular, tachycardic Chest - b/l rhonchi, b/l chest tubes w/o air leak Abdomen - soft, non tender, + bowel sounds Extremities - 1+ edema Skin - no rashes Neuro - RASS -3 GU - scrotal edema   Resolved Hospital Problem list     Assessment & Plan:   Acute hypoxic/hypercapnic respiratory failure with ARDS. - differential includes post-COVID pneumonitis, drug reaction from brentuximab, infectious process - Brentuximab carries 5% risk of pulmonary toxicity which occurs weeks into administration, treated with steroids and with holding agent - PJP stain negative 4/5.  Serum fungitel positive. Serum crypto, urine histo, legionella negative. S/P FOB on 4/8.  Repeat FOB COVID testing negative. - goal SpO2 88 to 95%, plateau pressure < 30, driving pressure < 15 - allow for permissive hypercapnia; goal  pH > 7.20 - day day 7/21 of clindamycin and primaquine; would then need daily bactrim DS prophylaxis - continue solumedrol 60 mg bid - f/u CXR, ABG - prn BDs  Thrush. - day 10/14 of fluconazole  Bilateral spontaneous pneumothoraces with subcutaneous emphysema. - change b/l chest tubes to -10 suction - f/u CXR  Acute metabolic encephalopathy 2nd to hypoxia/hypercapnia. - RASS goal -3 to -4  Hypotension likely from sedation. - pressors to keep MAP > 65     Hyperkalemia in setting of acidosis. - received lokelma 4/09 - f/u BMET  DM type II poorly controlled with steroid induced hyperglycemia. - resistant scale of SSI with lantus and tube feed coverage  Anemia of critical illness and chronic disease. Hx of Hodgkin's lymphoma. - f/u CBC - transfuse for Hb < 7 or significant bleeding  HLD. -continue lipitor   Severe Protein Calorie Malnutrition. - continue tube feeds  Physical deconditioning. - passive ROM   Best practice:  Diet: TF DVT prophylaxis: lovenox GI prophylaxis: PPI  Mobility: BR Code Status: Full code  Disposition: ICU  Labs    CMP Latest Ref Rng & Units 07/21/2019 07/20/2019 07/19/2019  Glucose 70 - 99 mg/dL 130(H) 147(H) 167(H)  BUN 8 - 23 mg/dL 45(H) 45(H) 40(H)  Creatinine 0.61 - 1.24 mg/dL 0.45(L) 0.58(L) 0.76  Sodium 135 - 145 mmol/L 147(H) 142 138  Potassium 3.5 - 5.1 mmol/L 5.2(H) 5.4(H) 5.8(H)  Chloride 98 - 111 mmol/L 102 100 100  CO2 22 - 32 mmol/L 42(H) 37(H) 34(H)  Calcium 8.9 - 10.3 mg/dL 8.9 8.9 8.9  Total Protein 6.5 - 8.1 g/dL - 7.0 -  Total Bilirubin 0.3 - 1.2 mg/dL - 0.5 -  Alkaline Phos 38 - 126 U/L - 85 -  AST 15 - 41 U/L - 31 -  ALT 0 - 44 U/L - 40 -    CBC Latest Ref Rng & Units 07/21/2019 07/20/2019 07/19/2019  WBC 4.0 - 10.5 K/uL 11.8(H) 33.3(H) 31.5(H)  Hemoglobin 13.0 - 17.0 g/dL 7.9(L) 9.3(L) 9.8(L)  Hematocrit 39.0 - 52.0 % 27.7(L) 32.6(L) 33.2(L)  Platelets 150 - 400 K/uL 204 303 396    ABG    Component Value Date/Time   PHART 7.265 (L) 07/21/2019 0409   PCO2ART 88.4 (HH) 07/21/2019 0409   PO2ART 89.7 07/21/2019 0409   HCO3 38.9 (H) 07/21/2019 0409   TCO2 28 07/08/2019 1331   O2SAT 96.1 07/21/2019 0409    CBG (last 3)  Recent Labs    07/20/19 2345 07/21/19 0340 07/21/19 0739  GLUCAP 171* 131* 142*    Critical care time: 34 minutes     Chesley Mires, MD [place] Pager - [phone] 07/21/2019, 9:47 AM

## 2019-07-22 ENCOUNTER — Inpatient Hospital Stay: Payer: BC Managed Care – PPO

## 2019-07-22 ENCOUNTER — Inpatient Hospital Stay: Payer: BC Managed Care – PPO | Admitting: Hematology

## 2019-07-22 ENCOUNTER — Inpatient Hospital Stay (HOSPITAL_COMMUNITY): Payer: BC Managed Care – PPO

## 2019-07-22 DIAGNOSIS — J189 Pneumonia, unspecified organism: Secondary | ICD-10-CM | POA: Diagnosis not present

## 2019-07-22 LAB — GLUCOSE, CAPILLARY
Glucose-Capillary: 111 mg/dL — ABNORMAL HIGH (ref 70–99)
Glucose-Capillary: 122 mg/dL — ABNORMAL HIGH (ref 70–99)
Glucose-Capillary: 140 mg/dL — ABNORMAL HIGH (ref 70–99)
Glucose-Capillary: 146 mg/dL — ABNORMAL HIGH (ref 70–99)
Glucose-Capillary: 164 mg/dL — ABNORMAL HIGH (ref 70–99)
Glucose-Capillary: 174 mg/dL — ABNORMAL HIGH (ref 70–99)

## 2019-07-22 LAB — BLOOD GAS, ARTERIAL
Acid-Base Excess: 14.1 mmol/L — ABNORMAL HIGH (ref 0.0–2.0)
Bicarbonate: 44.4 mmol/L — ABNORMAL HIGH (ref 20.0–28.0)
FIO2: 60
MECHVT: 380 mL
O2 Saturation: 99.3 %
PEEP: 10 cmH2O
Patient temperature: 97.3
RATE: 35 resp/min
pCO2 arterial: 83.8 mmHg (ref 32.0–48.0)
pH, Arterial: 7.339 — ABNORMAL LOW (ref 7.350–7.450)
pO2, Arterial: 130 mmHg — ABNORMAL HIGH (ref 83.0–108.0)

## 2019-07-22 LAB — BASIC METABOLIC PANEL
Anion gap: 6 (ref 5–15)
BUN: 45 mg/dL — ABNORMAL HIGH (ref 8–23)
CO2: 43 mmol/L — ABNORMAL HIGH (ref 22–32)
Calcium: 8.8 mg/dL — ABNORMAL LOW (ref 8.9–10.3)
Chloride: 95 mmol/L — ABNORMAL LOW (ref 98–111)
Creatinine, Ser: 0.33 mg/dL — ABNORMAL LOW (ref 0.61–1.24)
GFR calc Af Amer: >60 mL/min (ref >60)
GFR calc non Af Amer: >60 mL/min (ref >60)
Glucose, Bld: 128 mg/dL — ABNORMAL HIGH (ref 70–99)
Potassium: 4.9 mmol/L (ref 3.5–5.1)
Sodium: 144 mmol/L (ref 135–145)

## 2019-07-22 LAB — CBC
HCT: 26.1 % — ABNORMAL LOW (ref 39.0–52.0)
Hemoglobin: 7.6 g/dL — ABNORMAL LOW (ref 13.0–17.0)
MCH: 30 pg (ref 26.0–34.0)
MCHC: 29.1 g/dL — ABNORMAL LOW (ref 30.0–36.0)
MCV: 103.2 fL — ABNORMAL HIGH (ref 80.0–100.0)
Platelets: 201 10*3/uL (ref 150–400)
RBC: 2.53 MIL/uL — ABNORMAL LOW (ref 4.22–5.81)
RDW: 19.2 % — ABNORMAL HIGH (ref 11.5–15.5)
WBC: 9.4 10*3/uL (ref 4.0–10.5)
nRBC: 0 % (ref 0.0–0.2)

## 2019-07-22 MED ORDER — CLINDAMYCIN PALMITATE HCL 75 MG/5ML PO SOLR
600.0000 mg | Freq: Four times a day (QID) | ORAL | Status: DC
  Filled 2019-07-22 (×2): qty 40

## 2019-07-22 MED ORDER — SODIUM CHLORIDE 0.9 % IV SOLN
100.0000 mg | INTRAVENOUS | Status: DC
  Administered 2019-07-23 – 2019-07-30 (×8): 100 mg via INTRAVENOUS
  Filled 2019-07-22 (×10): qty 100

## 2019-07-22 MED ORDER — CLINDAMYCIN PALMITATE HCL 75 MG/5ML PO SOLR
600.0000 mg | Freq: Three times a day (TID) | ORAL | Status: AC
  Administered 2019-07-22 – 2019-07-30 (×26): 600 mg
  Filled 2019-07-22 (×28): qty 40

## 2019-07-22 MED ORDER — SODIUM CHLORIDE 0.9 % IV SOLN
200.0000 mg | Freq: Once | INTRAVENOUS | Status: AC
  Administered 2019-07-22: 200 mg via INTRAVENOUS
  Filled 2019-07-22: qty 200

## 2019-07-22 NOTE — Progress Notes (Addendum)
NAME:  Steve [name], MRN:  [id number], DOB:  14-Jun-1955, LOS: 35 ADMISSION DATE:  07/08/2019, CONSULTATION DATE:  3/29  REFERRING MD:  [name], CHIEF COMPLAINT:  dyspnea   Brief History   64 y/o male, with prior COVID infection 05/2019, admitted with severe acute respiratory failure with hypoxemia in the setting of receiving chemotherapy for Hodgkin's lymphoma.    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 ARDS. Bilateral PTX s/p CTx2  Consults:  ID >> s/o 4/09 Oncology >> s/o 4/05  Procedures:  Cefepime 3/29 >> 3/31 Vanc 3/29 >> 3/30 Azithromycin 3/29 >> 4/1 Bactrim 3/31 >> 4/5  Fluconazole 3/29 >> Meropenem 3/31 >> Clindamycin 4/5 >> Primaquine 4/5 >>  Significant Diagnostic Tests:   CTA chest 3/29 > no PE, diffuse GGO upper lobes, patchy consolidation in bases, peripheral fibrotic changes noted on prior CT chest still present  ECHO 3/30 > LVEF 60 to 18%, grade 1 diastolic dysfunction.  Mildly dilated LA, normal RV. Trivial MR and TR, otherwise normal valves  Micro Data:  UC 3/29 >> negative  BCx2 3/29 >> negative  PJP 4/2 >> negative  Fungitel 3/29 >> positive Blastomyces Antigen 3/20 >> negative Respiratory Culture 4/4 >> staph epidermis >> S-vanco, clinda BAL 4/8 >>  Fungal (BAL) 4/8 >> Yeast PJP (BAL) 4/8 >> negative  Aspergillus antigen (BAL) 4/8 >> Negative  Antimicrobials:  Cefepime 3/29 >> 3/31 Vanc 3/29 >> 3/30 Azithromycin 3/29 >> 4/1 Fluconazole 3/29 >> Meropenem 3/31 >> 4/6 Bactrim 3/31 >> 4/4 Clindamycin 4/5 >>  Primaquine 4/5 >>   Interim history/subjective:  Continues on the vent with FiO2 60, PEEP 10  Objective   Blood pressure 105/66, pulse 86,  temperature 97.9 F (36.6 C), resp. rate (!) 35, height _0  (1.676 m), weight 71.3 kg, SpO2 99 %.    Vent Mode: PRVC FiO2 (%):  [60 %] 60 % Set Rate:  [35 bmp] 35 bmp Vt Set:  [380 mL] 380 mL PEEP:  [10 cmH20] 10 cmH20 Plateau Pressure:  [25 cmH20-33 cmH20] 33 cmH20   Intake/Output Summary (Last 24 hours) at 07/22/2019 0857 Last data filed at 07/22/2019 0657 Gross per 24 hour  Intake 915.24 ml  Output 2604 ml  Net -1688.76 ml   Filed Weights   07/20/19 0500 07/21/19 0500 07/22/19 0350  Weight: 70.4 kg 73.5 kg 71.3 kg    Examination: Gen:      No acute distress HEENT:  EOMI, sclera anicteric. ETT Neck:     No masses; no thyromegaly Lungs:    Clear to auscultation bilaterally; normal respiratory effort CV:         Regular rate and rhythm; no murmurs Abd:      + bowel sounds; soft, non-tender; no palpable masses, no distension Ext:    No edema; adequate peripheral perfusion Skin:      Warm and dry; no rash Neuro: Sedated  Lab significant for ABG 7.34/84/130/99% Hemoglobin 7.6   Resolved Hospital Problem list     Assessment & Plan:   Acute hypoxic/hypercapnic respiratory failure with ARDS. - differential includes post-COVID pneumonitis, drug reaction from brentuximab, infectious process - Brentuximab carries 5% risk of pulmonary toxicity which occurs weeks into administration, treated with steroids and with holding agent -  PJP stain negative 4/5.  Serum fungitel positive. Serum crypto, urine histo, legionella negative. S/P FOB on 4/8.  Repeat FOB COVID testing negative. Continue ARDSnet ventilation Goal plateau pressure less than 30, driving pressure less than 15 Permissive hypercarbia On day 7/21 of clindamycin and primaquine; would then need daily bactrim DS prophylaxis Continue solumedrol 60 mg bid  Thrush. Day 11/14 of fluconazole. Will continue for now given yeast in BAL May need ID consult  Bilateral spontaneous pneumothoraces with subcutaneous  emphysema. Continue chest tubes to suction Follow-up chest x-ray  Acute metabolic encephalopathy 2nd to hypoxia/hypercapnia. RASS goal -3 to -4  Hypotension likely from sedation. Pressors to keep MAP > 65   Hyperkalemia in setting of acidosis. Received lokelma 4/09 Improved 4/12  DM type II poorly controlled with steroid induced hyperglycemia. Resistant scale of SSI with lantus and tube feed coverage  Anemia of critical illness and chronic disease. Hx of Hodgkin's lymphoma. - f/u CBC - transfuse for Hb < 7 or significant bleeding  HLD. -continue lipitor   Severe Protein Calorie Malnutrition. - continue tube feeds  Physical deconditioning. - passive ROM   Best practice:  Diet: TF DVT prophylaxis: lovenox GI prophylaxis: PPI  Mobility: BR Code Status: Full code  Disposition: ICU  Labs    CMP Latest Ref Rng & Units 07/22/2019 07/21/2019 07/20/2019  Glucose 70 - 99 mg/dL 128(H) 130(H) 147(H)  BUN 8 - 23 mg/dL 45(H) 45(H) 45(H)  Creatinine 0.61 - 1.24 mg/dL 0.33(L) 0.45(L) 0.58(L)  Sodium 135 - 145 mmol/L 144 147(H) 142  Potassium 3.5 - 5.1 mmol/L 4.9 5.2(H) 5.4(H)  Chloride 98 - 111 mmol/L 95(L) 102 100  CO2 22 - 32 mmol/L 43(H) 42(H) 37(H)  Calcium 8.9 - 10.3 mg/dL 8.8(L) 8.9 8.9  Total Protein 6.5 - 8.1 g/dL - - 7.0  Total Bilirubin 0.3 - 1.2 mg/dL - - 0.5  Alkaline Phos 38 - 126 U/L - - 85  AST 15 - 41 U/L - - 31  ALT 0 - 44 U/L - - 40    CBC Latest Ref Rng & Units 07/22/2019 07/21/2019 07/20/2019  WBC 4.0 - 10.5 K/uL 9.4 11.8(H) 33.3(H)  Hemoglobin 13.0 - 17.0 g/dL 7.6(L) 7.9(L) 9.3(L)  Hematocrit 39.0 - 52.0 % 26.1(L) 27.7(L) 32.6(L)  Platelets 150 - 400 K/uL 201 204 303    ABG    Component Value Date/Time   PHART 7.339 (L) 07/22/2019 0405   PCO2ART 83.8 (HH) 07/22/2019 0405   PO2ART 130 (H) 07/22/2019 0405   HCO3 44.4 (H) 07/22/2019 0405   TCO2 28 07/08/2019 1331   O2SAT 99.3 07/22/2019 0405    CBG (last 3)  Recent Labs    07/21/19 2354  07/22/19 0320 07/22/19 0845  GLUCAP 138* 111* 122*    Critical care time:    The patient is critically ill with multiple organ system failure and requires high complexity decision making for assessment and support, frequent evaluation and titration of therapies, advanced monitoring, review of radiographic studies and interpretation of complex data.   Critical Care Time devoted to patient care services, exclusive of separately billable procedures, described in this note is 35 minutes.   Marshell Garfinkel MD [facility] Please see [email or web address] for pager details.  07/22/2019, 9:52 AM

## 2019-07-23 DIAGNOSIS — J189 Pneumonia, unspecified organism: Secondary | ICD-10-CM | POA: Diagnosis not present

## 2019-07-23 LAB — BASIC METABOLIC PANEL
Anion gap: 6 (ref 5–15)
BUN: 42 mg/dL — ABNORMAL HIGH (ref 8–23)
CO2: 44 mmol/L — ABNORMAL HIGH (ref 22–32)
Calcium: 8.9 mg/dL (ref 8.9–10.3)
Chloride: 92 mmol/L — ABNORMAL LOW (ref 98–111)
Creatinine, Ser: 0.47 mg/dL — ABNORMAL LOW (ref 0.61–1.24)
GFR calc Af Amer: >60 mL/min (ref >60)
GFR calc non Af Amer: >60 mL/min (ref >60)
Glucose, Bld: 147 mg/dL — ABNORMAL HIGH (ref 70–99)
Potassium: 4.9 mmol/L (ref 3.5–5.1)
Sodium: 142 mmol/L (ref 135–145)

## 2019-07-23 LAB — GLUCOSE, CAPILLARY
Glucose-Capillary: 106 mg/dL — ABNORMAL HIGH (ref 70–99)
Glucose-Capillary: 135 mg/dL — ABNORMAL HIGH (ref 70–99)
Glucose-Capillary: 137 mg/dL — ABNORMAL HIGH (ref 70–99)
Glucose-Capillary: 137 mg/dL — ABNORMAL HIGH (ref 70–99)
Glucose-Capillary: 139 mg/dL — ABNORMAL HIGH (ref 70–99)
Glucose-Capillary: 155 mg/dL — ABNORMAL HIGH (ref 70–99)

## 2019-07-23 LAB — CBC
HCT: 27.7 % — ABNORMAL LOW (ref 39.0–52.0)
Hemoglobin: 7.9 g/dL — ABNORMAL LOW (ref 13.0–17.0)
MCH: 29.5 pg (ref 26.0–34.0)
MCHC: 28.5 g/dL — ABNORMAL LOW (ref 30.0–36.0)
MCV: 103.4 fL — ABNORMAL HIGH (ref 80.0–100.0)
Platelets: 231 10*3/uL (ref 150–400)
RBC: 2.68 MIL/uL — ABNORMAL LOW (ref 4.22–5.81)
RDW: 19.2 % — ABNORMAL HIGH (ref 11.5–15.5)
WBC: 10.4 10*3/uL (ref 4.0–10.5)
nRBC: 0.4 % — ABNORMAL HIGH (ref 0.0–0.2)

## 2019-07-23 MED ORDER — HYDRALAZINE HCL 20 MG/ML IJ SOLN
10.0000 mg | INTRAMUSCULAR | Status: DC | PRN
  Administered 2019-07-24 – 2019-07-26 (×4): 10 mg via INTRAVENOUS
  Filled 2019-07-23 (×4): qty 1

## 2019-07-23 NOTE — Progress Notes (Signed)
eLink Physician-Brief Progress Note Patient Name: Steve [name] DOB: Feb 08, 1956 MRN: [id number]   Date of Service  07/23/2019  HPI/Events of Note  Hypertension - BP = 177/79.  eICU Interventions  Will order: 1. Hydralazine 10 mg IV Q 4 hours PRN SBP > 170 or DBP > 100       Intervention Category Major Interventions: Hypertension - evaluation and management  [name] 07/23/2019, 3:25 AM

## 2019-07-23 NOTE — Progress Notes (Signed)
Patient systolic BP have been in the 160-180s all shift. E-link was notified. Sedation was increased and bp has started to trend downward. Patient now has a PRN hydralazine if needed.

## 2019-07-23 NOTE — Progress Notes (Signed)
NAME:  Steve [name], MRN:  [id number], DOB:  [birth year], LOS: 1 ADMISSION DATE:  07/08/2019, CONSULTATION DATE:  3/29  REFERRING MD:  [name], CHIEF COMPLAINT:  dyspnea   Brief History   64 y/o male, with prior COVID infection 05/2019, admitted with severe acute respiratory failure with hypoxemia in the setting of receiving chemotherapy for Hodgkin's lymphoma.    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 ARDS. Bilateral PTX s/p CTx2  Consults:  ID >> s/o 4/09 Oncology >> s/o 4/05  Procedures:  Cefepime 3/29 >> 3/31 Vanc 3/29 >> 3/30 Azithromycin 3/29 >> 4/1 Bactrim 3/31 >> 4/5  Fluconazole 3/29 >> Meropenem 3/31 >> Clindamycin 4/5 >> Primaquine 4/5 >>  Significant Diagnostic Tests:   CTA chest 3/29 > no PE, diffuse GGO upper lobes, patchy consolidation in bases, peripheral fibrotic changes noted on prior CT chest still present  ECHO 3/30 > LVEF 60 to 09%, grade 1 diastolic dysfunction.  Mildly dilated LA, normal RV. Trivial MR and TR, otherwise normal valves  Micro Data:  UC 3/29 >> negative  BCx2 3/29 >> negative  PJP 4/2 >> negative  Fungitel 3/29 >> positive Blastomyces Antigen 3/20 >> negative Respiratory Culture 4/4 >> staph epidermis >> S-vanco, clinda BAL 4/8 >>  Fungal (BAL) 4/8 >> Yeast PJP (BAL) 4/8 >> negative  Aspergillus antigen (BAL) 4/8 >> Negative  Antimicrobials:  Cefepime 3/29 >> 3/31 Vanc 3/29 >> 3/30 Azithromycin 3/29 >> 4/1 Fluconazole 3/29 >> Meropenem 3/31 >> 4/6 Bactrim 3/31 >> 4/4 Clindamycin 4/5 >>  Primaquine 4/5 >>   Interim history/subjective:   Hypotensive overnight requiring hydralazine Remains on the vent. FiO2 down to 50%. Remains  on PEEP of 10  Objective   Blood pressure (!) 162/77, pulse 86, temperature 98.6 F (37 C), temperature source Bladder, resp. rate (!) 35, height _0  (1.676 m), weight 70.6 kg, SpO2 94 %.    Vent Mode: PRVC FiO2 (%):  [50 %-60 %] 50 % Set Rate:  [35 bmp] 35 bmp Vt Set:  [380 mL] 380 mL PEEP:  [10 cmH20] 10 cmH20 Plateau Pressure:  [30 cmH20-33 cmH20] 33 cmH20   Intake/Output Summary (Last 24 hours) at 07/23/2019 0915 Last data filed at 07/23/2019 0800 Gross per 24 hour  Intake 4991.88 ml  Output 2175 ml  Net 2816.88 ml   Filed Weights   07/21/19 0500 07/22/19 0350 07/23/19 0454  Weight: 73.5 kg 71.3 kg 70.6 kg    Examination: Gen:      No acute distress HEENT:  EOMI, sclera anicteric. ETT Neck:     No masses; no thyromegaly Lungs:    Clear to auscultation bilaterally; normal respiratory effort CV:         Regular rate and rhythm; no murmurs Abd:      + bowel sounds; soft, non-tender; no palpable masses, no distension Ext:    No edema; adequate peripheral perfusion Skin:      Warm and dry; no rash Neuro: Sedated  Labs significant for BUN/creatinine 42/0.47, WBC 10.4, hemoglobin 7.9 No new imaging  Resolved Hospital Problem list     Assessment & Plan:   Acute hypoxic/hypercapnic respiratory failure with ARDS. - differential includes post-COVID pneumonitis, drug reaction from brentuximab, infectious process - Brentuximab  carries 5% risk of pulmonary toxicity which occurs weeks into administration, treated with steroids and with holding agent - PJP stain negative 4/5.  Serum fungitel positive. Serum crypto, urine histo, legionella negative. S/P FOB on 4/8.  Repeat FOB COVID testing negative. Continue ARDSnet ventilation Goal plateau pressure less than 30, driving pressure less than 15 Permissive hypercarbia On day 8/21 of clindamycin and primaquine per ID as suspicion for PJP is high ; would then need daily bactrim DS prophylaxis Start creatinine 12 aper steroids to  19m bid.   Thrush, Yeast in BAL Started anidulafungin give positive fungitell test  Bilateral spontaneous pneumothoraces with subcutaneous emphysema. Continue chest tubes to suction Follow-up chest x-ray  Acute metabolic encephalopathy 2nd to hypoxia/hypercapnia. Lighten sedation. Change Rass goal to -1   DM type II poorly controlled with steroid induced hyperglycemia. Resistant scale of SSI with lantus and tube feed coverage  Anemia of critical illness and chronic disease. Hx of Hodgkin's lymphoma. - f/u CBC - transfuse for Hb < 7 or significant bleeding  HLD. -continue lipitor   Severe Protein Calorie Malnutrition. - continue tube feeds  Physical deconditioning. - passive ROM   Best practice:  Diet: TF DVT prophylaxis: lovenox GI prophylaxis: PPI  Mobility: BR Code Status: Full code. Updated sister Disposition: ICU  Labs    CMP Latest Ref Rng & Units 07/23/2019 07/22/2019 07/21/2019  Glucose 70 - 99 mg/dL 147(H) 128(H) 130(H)  BUN 8 - 23 mg/dL 42(H) 45(H) 45(H)  Creatinine 0.61 - 1.24 mg/dL 0.47(L) 0.33(L) 0.45(L)  Sodium 135 - 145 mmol/L 142 144 147(H)  Potassium 3.5 - 5.1 mmol/L 4.9 4.9 5.2(H)  Chloride 98 - 111 mmol/L 92(L) 95(L) 102  CO2 22 - 32 mmol/L 44(H) 43(H) 42(H)  Calcium 8.9 - 10.3 mg/dL 8.9 8.8(L) 8.9  Total Protein 6.5 - 8.1 g/dL - - -  Total Bilirubin 0.3 - 1.2 mg/dL - - -  Alkaline Phos 38 - 126 U/L - - -  AST 15 - 41 U/L - - -  ALT 0 - 44 U/L - - -    CBC Latest Ref Rng & Units 07/23/2019 07/22/2019 07/21/2019  WBC 4.0 - 10.5 K/uL 10.4 9.4 11.8(H)  Hemoglobin 13.0 - 17.0 g/dL 7.9(L) 7.6(L) 7.9(L)  Hematocrit 39.0 - 52.0 % 27.7(L) 26.1(L) 27.7(L)  Platelets 150 - 400 K/uL 231 201 204    ABG    Component Value Date/Time   PHART 7.339 (L) 07/22/2019 0405   PCO2ART 83.8 (HH) 07/22/2019 0405   PO2ART 130 (H) 07/22/2019 0405   HCO3 44.4 (H) 07/22/2019 0405   TCO2 28 07/08/2019 1331   O2SAT 99.3 07/22/2019 0405    CBG (last 3)  Recent  Labs    07/22/19 2338 07/23/19 0333 07/23/19 0742  GLUCAP 146* 137* 137*    Critical care time:    The patient is critically ill with multiple organ system failure and requires high complexity decision making for assessment and support, frequent evaluation and titration of therapies, advanced monitoring, review of radiographic studies and interpretation of complex data.   Critical Care Time devoted to patient care services, exclusive of separately billable procedures, described in this note is 35 minutes.   PMarshell GarfinkelMD [facility] Please see [email or web address] for pager details.  07/23/2019, 9:15 AM

## 2019-07-24 ENCOUNTER — Inpatient Hospital Stay (HOSPITAL_COMMUNITY): Payer: BC Managed Care – PPO

## 2019-07-24 DIAGNOSIS — J189 Pneumonia, unspecified organism: Secondary | ICD-10-CM | POA: Diagnosis not present

## 2019-07-24 LAB — GLUCOSE, CAPILLARY
Glucose-Capillary: 148 mg/dL — ABNORMAL HIGH (ref 70–99)
Glucose-Capillary: 110 mg/dL — ABNORMAL HIGH (ref 70–99)
Glucose-Capillary: 168 mg/dL — ABNORMAL HIGH (ref 70–99)
Glucose-Capillary: 196 mg/dL — ABNORMAL HIGH (ref 70–99)
Glucose-Capillary: 162 mg/dL — ABNORMAL HIGH (ref 70–99)

## 2019-07-24 LAB — TRIGLYCERIDES: Triglycerides: 121 mg/dL (ref <150)

## 2019-07-24 MED ORDER — METHYLPREDNISOLONE SODIUM SUCC 40 MG IJ SOLR
40.0000 mg | Freq: Two times a day (BID) | INTRAMUSCULAR | Status: DC
  Administered 2019-07-24 – 2019-07-25 (×2): 40 mg via INTRAVENOUS
  Filled 2019-07-24 (×2): qty 1

## 2019-07-24 MED ORDER — PRO-STAT SUGAR FREE PO LIQD
30.0000 mL | Freq: Three times a day (TID) | ORAL | Status: DC
  Administered 2019-07-24 – 2019-07-30 (×18): 30 mL
  Filled 2019-07-24 (×17): qty 30

## 2019-07-24 MED ORDER — VITAL AF 1.2 CAL PO LIQD
1000.0000 mL | ORAL | Status: DC
  Administered 2019-07-24 – 2019-07-29 (×7): 1000 mL

## 2019-07-24 NOTE — Progress Notes (Signed)
It was noted that patient is having increased R sided swelling. RT informed MD and Chest xray ordered. elink notified of chest xray complete. MD videoed into the room to observe R sided swelling for himself.  Chest xray stable per MD, no new orders at this time.  Will continue to monitor

## 2019-07-24 NOTE — Progress Notes (Signed)
CCM MD wanted to reduce sedation throughout the day. After sedation had been reduced less that 50%, pt began to have a change in status. Notified Dr. Vaughan Browner in regards to pt HR in the 120-130 range, tachypnea, increased BP,  and O2 saturation at 87%. Sedation increased and PRN medications given. Will continue to monitor

## 2019-07-24 NOTE — Progress Notes (Signed)
eLink Physician-Brief Progress Note Patient Name: CHASON MCIVER DOB: 02-Jun-1955 MRN: [id number]   Date of Service  07/24/2019  HPI/Events of Note  Review of CXR - Bilateral chest tubes are noted in stable position. Persistent and slightly increased subcutaneous emphysema is noted. No definitive pneumothorax is seen at this time. Bilateral airspace opacities are again identified and stable.  eICU Interventions  Continue present management.      Intervention Category Major Interventions: Other:  [name] 07/24/2019, 9:35 PM

## 2019-07-24 NOTE — Progress Notes (Signed)
eLink Physician-Brief Progress Note Patient Name: Steve [name] DOB: 1956/01/12 MRN: [id number]   Date of Service  07/24/2019  HPI/Events of Note  Respiratory Therapy notes that R neck swelling worse tonight. Of note, he has massive Sullivan emphysema in his neck on previous CXR's  eICU Interventions  Will order: 1. Portable CXR STAT - r/o pneumothorax.     Intervention Category Major Interventions: Other:  [name] 07/24/2019, 8:46 PM

## 2019-07-24 NOTE — Progress Notes (Signed)
Nutrition Follow-up  DOCUMENTATION CODES:   Non-severe (moderate) malnutrition in context of chronic illness  INTERVENTION:  - Vital AF 1.2 @ 40 ml/hr with 30 ml prostat TID. - this regimen + kcal from current propofol rate will provide 1864 kcal (98% re-estimated kcal need), 117 grams protein, and 778 ml free water. - free water to continue to be per MD/NP.    NUTRITION DIAGNOSIS:   Moderate Malnutrition related to chronic illness, cancer and cancer related treatments as evidenced by mild fat depletion, mild muscle depletion, percent weight loss. -ongoing  GOAL:   Patient will meet greater than or equal to 90% of their needs -to be met with TF regimen  MONITOR:   Vent status, TF tolerance, Labs, Weight trends, Skin  ASSESSMENT:   64 year-old male with a history of Hodgkin's lymphoma (stage 3, diagnosed 02/2019) on chemotherapy. He follows with Dr. Maylon Peppers in Oncology. His most recent treatment was on 3/8. He presented to the ED with worsening respiratory status and inability to tolerate PO intake recently. He had his chemo delayed this week. He has a history of neutropenic fever in the past. He had a good response to chemo on his first follow up PET scan. His appetite has been poor and he has been receiving IV fluids.  Patient remains intubated with OGT in place. He is receiving Vital High Protein @ 40 ml/hr with 30 ml prostat BID and 250 ml free water QID. This regimen is providing 1160 kcal, 114 grams protein, and 1802 ml free water. Will adjust TF regimen to better meet re-estimated kcal need. Weight today consistent with admission weight.   Per notes: - ARDS - thrush - bilateral spontaneous penumothoraces with emphysema--chest tubes placed - protein calorie malnutrition - physical deconditioning   Patient is currently intubated on ventilator support MV: 13.3 L/min Temp (24hrs), Avg:98 F (36.7 C), Min:95.9 F (35.5 C), Max:99.7 F (37.6 C) Propofol: 15.6 ml/hr (412  kcal)    Labs reviewed; CBGs: 148 and 110 mg/dl, Cl: 92 mmol/l, BUN: 42 mg/dl, creatinine: 0.47 mg/dl. Medications reviewed; 300 mg ferrous sulfate/day, 2 units novolog every 4 hours, sliding scale novolog, 8 units lantus BID, 40 mg solu-medrol BID, 15 ml multivitamin/day, 40 mg protonix/day. Drips; fentanyl @ 250 mcg/hr, propofol @ 40 mcg/kg/min.   Diet Order:   Diet Order    None      EDUCATION NEEDS:   No education needs have been identified at this time  Skin:  Skin Assessment: Skin Integrity Issues: Skin Integrity Issues:: Stage I Stage I: sacrum (new 4/13)  Last BM:  4/14  Height:   Ht Readings from Last 1 Encounters:  07/18/19 5' 6"  (1.676 m)    Weight:   Wt Readings from Last 1 Encounters:  07/24/19 73.9 kg    Ideal Body Weight:  64.5 kg  BMI:  Body mass index is 26.3 kg/m.  Estimated Nutritional Needs:   Kcal:  1901 kcal  Protein:  110-120 grams  Fluid:  >/= 1.8 L/day     Jarome Matin, MS, RD, LDN, CNSC Inpatient Clinical Dietitian RD pager # available in AMION  After hours/weekend pager # available in [facility]

## 2019-07-24 NOTE — Progress Notes (Signed)
NAME:  Steve [name], MRN:  [id number], DOB:  1956-03-20, LOS: 21 ADMISSION DATE:  07/08/2019, CONSULTATION DATE:  3/29  REFERRING MD:  [name], CHIEF COMPLAINT:  dyspnea   Brief History   64 y/o male, with prior COVID infection 05/2019, admitted with severe acute respiratory failure with hypoxemia in the setting of receiving chemotherapy for Hodgkin's lymphoma.    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 ARDS. Bilateral PTX s/p CTx2  Consults:  ID >> s/o 4/09 Oncology >> s/o 4/05  Procedures:    Significant Diagnostic Tests:   CTA chest 3/29 > no PE, diffuse GGO upper lobes, patchy consolidation in bases, peripheral fibrotic changes noted on prior CT chest still present  ECHO 3/30 > LVEF 60 to 40%, grade 1 diastolic dysfunction.  Mildly dilated LA, normal RV. Trivial MR and TR, otherwise normal valves  Micro Data:  UC 3/29 >> negative  BCx2 3/29 >> negative  PJP 4/2 >> negative  Fungitel 3/29 >> positive Blastomyces Antigen 3/20 >> negative Respiratory Culture 4/4 >> staph epidermis >> S-vanco, clinda BAL 4/8 >>  Fungal (BAL) 4/8 >> Candida glabrata PJP (BAL) 4/8 >> negative  Aspergillus antigen (BAL) 4/8 >> Negative  Antimicrobials:  Cefepime 3/29 >> 3/31 Vanc 3/29 >> 3/30 Azithromycin 3/29 >> 4/1 Fluconazole 3/29 >> 4/13 Meropenem 3/31 >> 4/6 Bactrim 3/31 >> 4/4  Clindamycin 4/5 >>  Primaquine 4/5 >>  Anidulafungin 4/13 >>   Interim history/subjective:   Remains on the vent with FiO2 50%, PEEP of 10  Objective   Blood pressure (!) 168/89, pulse 100, temperature 99.7 F (37.6 C), resp. rate (!) 35, height _0  (1.676 m), weight 73.9 kg, SpO2 93 %.    Vent Mode:  PRVC FiO2 (%):  [50 %] 50 % Set Rate:  [35 bmp] 35 bmp Vt Set:  [380 mL] 380 mL PEEP:  [10 cmH20] 10 cmH20 Plateau Pressure:  [30 JWJ19-14 cmH20] 31 cmH20   Intake/Output Summary (Last 24 hours) at 07/24/2019 1042 Last data filed at 07/24/2019 1000 Gross per 24 hour  Intake 2224.24 ml  Output 2009 ml  Net 215.24 ml   Filed Weights   07/22/19 0350 07/23/19 0454 07/24/19 0641  Weight: 71.3 kg 70.6 kg 73.9 kg    Examination: Gen:      No acute distress HEENT:  EOMI, sclera anicteric Neck:     No masses; no thyromegaly, ETT Lungs:    Clear to auscultation bilaterally; normal respiratory effort CV:         Regular rate and rhythm; no murmurs Abd:      + bowel sounds; soft, non-tender; no palpable masses, no distension Ext:    No edema; adequate peripheral perfusion Skin:      Warm and dry; no rash Neuro: Sedated  Labs significant for BUN/creatinine 42/0.47, WBC 10.4, hemoglobin 7.9, platelets 231  Resolved Hospital Problem list     Assessment & Plan:   Acute hypoxic/hypercapnic respiratory failure with ARDS. - differential includes post-COVID pneumonitis, drug reaction from brentuximab, infectious process - Brentuximab carries 5% risk of pulmonary toxicity which occurs weeks into administration, treated with steroids and with holding agent - PJP stain negative 4/5.  Serum fungitel positive. Serum crypto, urine histo, legionella negative.  S/P FOB on 4/8.  Repeat FOB COVID testing negative. Continue ARDSnet ventilation Goal plateau pressure less than 30, driving pressure less than 15 Permissive hypercarbia On day 9/21 of clindamycin and primaquine per ID as suspicion for PJP is high ; would then need daily bactrim DS prophylaxis Start slow steroid taper. Now at solumedrol 40 mg bid  Thrush, C glabrata in BAL Started anidulafungin given positive fungitell test He has previously received Diflucan for thrush.  Bilateral spontaneous pneumothoraces with subcutaneous  emphysema. Continue chest tubes to suction Follow-up chest x-ray  Acute metabolic encephalopathy 2nd to hypoxia/hypercapnia. Lighten sedation. Change Rass goal to -1   DM type II poorly controlled with steroid induced hyperglycemia. Resistant scale of SSI with lantus and tube feed coverage  Anemia of critical illness and chronic disease. Hx of Hodgkin's lymphoma. - f/u CBC - transfuse for Hb < 7 or significant bleeding  HLD. -continue lipitor   Severe Protein Calorie Malnutrition. - continue tube feeds  Physical deconditioning. - passive ROM   Best practice:  Diet: TF DVT prophylaxis: lovenox GI prophylaxis: PPI  Mobility: BR Code Status: Full code. Updated sister Disposition: ICU  Labs    CMP Latest Ref Rng & Units 07/23/2019 07/22/2019 07/21/2019  Glucose 70 - 99 mg/dL 147(H) 128(H) 130(H)  BUN 8 - 23 mg/dL 42(H) 45(H) 45(H)  Creatinine 0.61 - 1.24 mg/dL 0.47(L) 0.33(L) 0.45(L)  Sodium 135 - 145 mmol/L 142 144 147(H)  Potassium 3.5 - 5.1 mmol/L 4.9 4.9 5.2(H)  Chloride 98 - 111 mmol/L 92(L) 95(L) 102  CO2 22 - 32 mmol/L 44(H) 43(H) 42(H)  Calcium 8.9 - 10.3 mg/dL 8.9 8.8(L) 8.9  Total Protein 6.5 - 8.1 g/dL - - -  Total Bilirubin 0.3 - 1.2 mg/dL - - -  Alkaline Phos 38 - 126 U/L - - -  AST 15 - 41 U/L - - -  ALT 0 - 44 U/L - - -    CBC Latest Ref Rng & Units 07/23/2019 07/22/2019 07/21/2019  WBC 4.0 - 10.5 K/uL 10.4 9.4 11.8(H)  Hemoglobin 13.0 - 17.0 g/dL 7.9(L) 7.6(L) 7.9(L)  Hematocrit 39.0 - 52.0 % 27.7(L) 26.1(L) 27.7(L)  Platelets 150 - 400 K/uL 231 201 204    ABG    Component Value Date/Time   PHART 7.339 (L) 07/22/2019 0405   PCO2ART 83.8 (HH) 07/22/2019 0405   PO2ART 130 (H) 07/22/2019 0405   HCO3 44.4 (H) 07/22/2019 0405   TCO2 28 07/08/2019 1331   O2SAT 99.3 07/22/2019 0405    CBG (last 3)  Recent Labs    07/23/19 2342 07/24/19 0350 07/24/19 0755  GLUCAP 106* 148* 110*    Critical care time:    The patient is critically ill with  multiple organ system failure and requires high complexity decision making for assessment and support, frequent evaluation and titration of therapies, advanced monitoring, review of radiographic studies and interpretation of complex data.   Critical Care Time devoted to patient care services, exclusive of separately billable procedures, described in this note is 35 minutes.   Marshell Garfinkel MD [facility] Please see [email or web address] for pager details.  07/24/2019, 10:42 AM

## 2019-07-25 ENCOUNTER — Inpatient Hospital Stay (HOSPITAL_COMMUNITY): Payer: BC Managed Care – PPO

## 2019-07-25 DIAGNOSIS — J189 Pneumonia, unspecified organism: Secondary | ICD-10-CM | POA: Diagnosis not present

## 2019-07-25 LAB — CBC
HCT: 27.6 % — ABNORMAL LOW (ref 39.0–52.0)
Hemoglobin: 7.9 g/dL — ABNORMAL LOW (ref 13.0–17.0)
MCH: 29.8 pg (ref 26.0–34.0)
MCHC: 28.6 g/dL — ABNORMAL LOW (ref 30.0–36.0)
MCV: 104.2 fL — ABNORMAL HIGH (ref 80.0–100.0)
Platelets: 202 10*3/uL (ref 150–400)
RBC: 2.65 MIL/uL — ABNORMAL LOW (ref 4.22–5.81)
RDW: 20 % — ABNORMAL HIGH (ref 11.5–15.5)
WBC: 13.1 10*3/uL — ABNORMAL HIGH (ref 4.0–10.5)
nRBC: 0.4 % — ABNORMAL HIGH (ref 0.0–0.2)

## 2019-07-25 LAB — BASIC METABOLIC PANEL
Anion gap: 3 — ABNORMAL LOW (ref 5–15)
BUN: 44 mg/dL — ABNORMAL HIGH (ref 8–23)
CO2: 45 mmol/L — ABNORMAL HIGH (ref 22–32)
Calcium: 8.4 mg/dL — ABNORMAL LOW (ref 8.9–10.3)
Chloride: 94 mmol/L — ABNORMAL LOW (ref 98–111)
Creatinine, Ser: 0.44 mg/dL — ABNORMAL LOW (ref 0.61–1.24)
GFR calc Af Amer: >60 mL/min (ref >60)
GFR calc non Af Amer: >60 mL/min (ref >60)
Glucose, Bld: 141 mg/dL — ABNORMAL HIGH (ref 70–99)
Potassium: 4.5 mmol/L (ref 3.5–5.1)
Sodium: 142 mmol/L (ref 135–145)

## 2019-07-25 LAB — GLUCOSE, CAPILLARY
Glucose-Capillary: 115 mg/dL — ABNORMAL HIGH (ref 70–99)
Glucose-Capillary: 118 mg/dL — ABNORMAL HIGH (ref 70–99)
Glucose-Capillary: 121 mg/dL — ABNORMAL HIGH (ref 70–99)
Glucose-Capillary: 127 mg/dL — ABNORMAL HIGH (ref 70–99)
Glucose-Capillary: 135 mg/dL — ABNORMAL HIGH (ref 70–99)
Glucose-Capillary: 143 mg/dL — ABNORMAL HIGH (ref 70–99)
Glucose-Capillary: 170 mg/dL — ABNORMAL HIGH (ref 70–99)
Glucose-Capillary: 196 mg/dL — ABNORMAL HIGH (ref 70–99)

## 2019-07-25 LAB — MAGNESIUM: Magnesium: 2 mg/dL (ref 1.7–2.4)

## 2019-07-25 LAB — PHOSPHORUS: Phosphorus: 3.2 mg/dL (ref 2.5–4.6)

## 2019-07-25 MED ORDER — FUROSEMIDE 10 MG/ML IJ SOLN
40.0000 mg | Freq: Two times a day (BID) | INTRAMUSCULAR | Status: DC
  Administered 2019-07-25 – 2019-08-05 (×23): 40 mg via INTRAVENOUS
  Filled 2019-07-25 (×23): qty 4

## 2019-07-25 MED ORDER — METHYLPREDNISOLONE SODIUM SUCC 125 MG IJ SOLR
60.0000 mg | Freq: Every day | INTRAMUSCULAR | Status: DC
  Administered 2019-07-26 – 2019-07-31 (×6): 60 mg via INTRAVENOUS
  Filled 2019-07-25 (×6): qty 2

## 2019-07-25 MED ORDER — DEXMEDETOMIDINE HCL IN NACL 200 MCG/50ML IV SOLN
0.4000 ug/kg/h | INTRAVENOUS | Status: DC
  Administered 2019-07-25 – 2019-07-26 (×3): 0.4 ug/kg/h via INTRAVENOUS
  Administered 2019-07-26: 15:00:00 1.2 ug/kg/h via INTRAVENOUS
  Filled 2019-07-25 (×2): qty 50
  Filled 2019-07-25: qty 100
  Filled 2019-07-25: qty 50

## 2019-07-25 NOTE — Progress Notes (Signed)
1200: No urine output noted in foley catheter. Foley flushed by RN, resistance met, seal broke, bag detached from foley. Bag tip cleaned and reinserted. Urine output increased. Agricultural consultant and NP notified. Verbal order received to remove and re-insert foley. Will continue to monitor.

## 2019-07-25 NOTE — Progress Notes (Addendum)
NAME:  Steve [name], MRN:  [id number], DOB:  28-Aug-1955, LOS: 55 ADMISSION DATE:  07/08/2019, CONSULTATION DATE:  3/29  REFERRING MD:  [name], CHIEF COMPLAINT:  dyspnea   Brief History   64 y/o male, with prior COVID infection 05/2019, admitted with severe acute respiratory failure with hypoxemia in the setting of receiving chemotherapy for Hodgkin's lymphoma.    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 ARDS. Bilateral PTX s/p CTx2  Consults:  ID >> s/o 4/09 Oncology >> s/o 4/05  Procedures:    Significant Diagnostic Tests:   CTA chest 3/29 > no PE, diffuse GGO upper lobes, patchy consolidation in bases, peripheral fibrotic changes noted on prior CT chest still present  ECHO 3/30 > LVEF 60 to 30%, grade 1 diastolic dysfunction.  Mildly dilated LA, normal RV. Trivial MR and TR, otherwise normal valves  Micro Data:  UC 3/29 >> negative  BCx2 3/29 >> negative  PJP 4/2 >> negative  Fungitel 3/29 >> positive Blastomyces Antigen 3/20 >> negative Respiratory Culture 4/4 >> staph epidermis >> S-vanco, clinda BAL 4/8 >>  Fungal (BAL) 4/8 >> Candida glabrata PJP (BAL) 4/8 >> negative  Aspergillus antigen (BAL) 4/8 >> Negative  Antimicrobials:  Cefepime 3/29 >> 3/31 Vanc 3/29 >> 3/30 Azithromycin 3/29 >> 4/1 Fluconazole 3/29 >> 4/13 Meropenem 3/31 >> 4/6 Bactrim 3/31 >> 4/4  Clindamycin 4/5 >>  Primaquine 4/5 >>  Anidulafungin 4/13 >>   Interim history/subjective:   Remains on the vent.  PEEP and FiO2 marginally better at 40% and 8 respectively  Objective   Blood pressure (!) 165/73, pulse 78, temperature 97.9 F (36.6 C), resp. rate (!) 35, height _0  (1.676 m), weight 73.2  kg, SpO2 99 %.    Vent Mode: PRVC FiO2 (%):  [50 %-60 %] 50 % Set Rate:  [35 bmp] 35 bmp Vt Set:  [380 mL] 380 mL PEEP:  [10 cmH20] 10 cmH20 Plateau Pressure:  [27 cmH20-31 cmH20] 27 cmH20   Intake/Output Summary (Last 24 hours) at 07/25/2019 0915 Last data filed at 07/25/2019 0610 Gross per 24 hour  Intake 2945.43 ml  Output 1530 ml  Net 1415.43 ml   Filed Weights   07/23/19 0454 07/24/19 0641 07/25/19 0500  Weight: 70.6 kg 73.9 kg 73.2 kg    Examination: Gen:      No acute distress HEENT:  EOMI, sclera anicteric Neck:     No masses; no thyromegaly, ETT Lungs:    Clear to auscultation bilaterally; normal respiratory effort CV:         Regular rate and rhythm; no murmurs Abd:      + bowel sounds; soft, non-tender; no palpable masses, no distension Ext:    1+ edema; adequate peripheral perfusion Skin:      Warm and dry; no rash Neuro: Sedated  Lab significant for BUN/creatinine 44/0.44, WBC 13.1, hemoglobin 7.9 Chest x-ray 4/15-chest tube and ET tube in stable position.  Diffuse subcu emphysema, bilateral airspace disease.  Resolved Hospital Problem list     Assessment & Plan:   Acute hypoxic/hypercapnic respiratory failure with ARDS. - differential includes post-COVID pneumonitis, drug reaction from brentuximab, infectious process - Brentuximab carries 5% risk of pulmonary toxicity which occurs weeks into administration, treated  with steroids and with holding agent - PJP stain negative 4/5.  Serum fungitel positive. Serum crypto, urine histo, legionella negative. S/P FOB on 4/8.  Repeat FOB COVID testing negative. Continue ARDSnet ventilation Goal plateau pressure less than 30, driving pressure less than 15 Permissive hypercarbia On day 10/21 of clindamycin and primaquine per ID as suspicion for PJP is high ; would then need daily bactrim DS prophylaxis Start slow steroid taper. Solumedrol changed to 60 gm qd on 4/15 Start lasix for diuresis as he appears vol  overloaded  Thrush, C glabrata in BAL Started anidulafungin given positive fungitell test He has previously received Diflucan for thrush.  Bilateral spontaneous pneumothoraces with subcutaneous emphysema. Continue chest tubes to suction Follow-up chest x-ray  Acute metabolic encephalopathy 2nd to hypoxia/hypercapnia. Lighten sedation. Change Rass goal to -1 Add predex and wean off fentanyl and propofol  DM type II poorly controlled with steroid induced hyperglycemia. Resistant scale of SSI with lantus and tube feed coverage  Anemia of critical illness and chronic disease. Hx of Hodgkin's lymphoma. - f/u CBC - transfuse for Hb < 7 or significant bleeding  HLD. -continue lipitor   Severe Protein Calorie Malnutrition. Continue tube feeds  Physical deconditioning. Passive ROM   Goals of care Discussed with wife at bedside 4/15.  Explained that we are trying to wean him off the ventilator but may be a long process given his prior Covid infection, ongoing lung inflammation secondary to pneumonitis. Discussed need for tracheostomy if we are not able to get him off the ventilator by next week.  His family is aware of what that entails and would be okay for the procedure as a last resort.  Best practice:  Diet: TF DVT prophylaxis: lovenox GI prophylaxis: PPI  Mobility: BR Code Status: Full code.  See above Disposition: ICU  Critical care time:    The patient is critically ill with multiple organ system failure and requires high complexity decision making for assessment and support, frequent evaluation and titration of therapies, advanced monitoring, review of radiographic studies and interpretation of complex data.   Critical Care Time devoted to patient care services, exclusive of separately billable procedures, described in this note is 35 minutes.   Marshell Garfinkel MD [facility] Please see [email or web address] for pager details.  07/25/2019, 9:30  AM

## 2019-07-26 ENCOUNTER — Inpatient Hospital Stay (HOSPITAL_COMMUNITY): Payer: BC Managed Care – PPO

## 2019-07-26 DIAGNOSIS — J189 Pneumonia, unspecified organism: Secondary | ICD-10-CM | POA: Diagnosis not present

## 2019-07-26 LAB — GLUCOSE, CAPILLARY
Glucose-Capillary: 83 mg/dL (ref 70–99)
Glucose-Capillary: 98 mg/dL (ref 70–99)
Glucose-Capillary: 181 mg/dL — ABNORMAL HIGH (ref 70–99)
Glucose-Capillary: 198 mg/dL — ABNORMAL HIGH (ref 70–99)
Glucose-Capillary: 194 mg/dL — ABNORMAL HIGH (ref 70–99)
Glucose-Capillary: 179 mg/dL — ABNORMAL HIGH (ref 70–99)

## 2019-07-26 LAB — CBC
HCT: 27.9 % — ABNORMAL LOW (ref 39.0–52.0)
Hemoglobin: 8.2 g/dL — ABNORMAL LOW (ref 13.0–17.0)
MCH: 30.5 pg (ref 26.0–34.0)
MCHC: 29.4 g/dL — ABNORMAL LOW (ref 30.0–36.0)
MCV: 103.7 fL — ABNORMAL HIGH (ref 80.0–100.0)
Platelets: 175 10*3/uL (ref 150–400)
RBC: 2.69 MIL/uL — ABNORMAL LOW (ref 4.22–5.81)
RDW: 20.2 % — ABNORMAL HIGH (ref 11.5–15.5)
WBC: 15 10*3/uL — ABNORMAL HIGH (ref 4.0–10.5)
nRBC: 0.2 % (ref 0.0–0.2)

## 2019-07-26 LAB — BASIC METABOLIC PANEL
Anion gap: 6 (ref 5–15)
BUN: 40 mg/dL — ABNORMAL HIGH (ref 8–23)
CO2: 45 mmol/L — ABNORMAL HIGH (ref 22–32)
Calcium: 8.1 mg/dL — ABNORMAL LOW (ref 8.9–10.3)
Chloride: 91 mmol/L — ABNORMAL LOW (ref 98–111)
Creatinine, Ser: 0.45 mg/dL — ABNORMAL LOW (ref 0.61–1.24)
GFR calc Af Amer: >60 mL/min (ref >60)
GFR calc non Af Amer: >60 mL/min (ref >60)
Glucose, Bld: 100 mg/dL — ABNORMAL HIGH (ref 70–99)
Potassium: 3.6 mmol/L (ref 3.5–5.1)
Sodium: 142 mmol/L (ref 135–145)

## 2019-07-26 LAB — MAGNESIUM: Magnesium: 1.7 mg/dL (ref 1.7–2.4)

## 2019-07-26 LAB — BLOOD GAS, ARTERIAL
Acid-Base Excess: 21.2 mmol/L — ABNORMAL HIGH (ref 0.0–2.0)
Bicarbonate: 48.7 mmol/L — ABNORMAL HIGH (ref 20.0–28.0)
O2 Saturation: 94.2 %
Patient temperature: 98.6
pCO2 arterial: 78.3 mmHg (ref 32.0–48.0)
pH, Arterial: 7.411 (ref 7.350–7.450)
pO2, Arterial: 71.9 mmHg — ABNORMAL LOW (ref 83.0–108.0)

## 2019-07-26 LAB — PHOSPHORUS: Phosphorus: 2.3 mg/dL — ABNORMAL LOW (ref 2.5–4.6)

## 2019-07-26 MED ORDER — DEXMEDETOMIDINE HCL IN NACL 400 MCG/100ML IV SOLN
0.4000 ug/kg/h | INTRAVENOUS | Status: DC
  Administered 2019-07-26: 0.4 ug/kg/h via INTRAVENOUS
  Administered 2019-07-26: 1.4 ug/kg/h via INTRAVENOUS
  Administered 2019-07-27: 1 ug/kg/h via INTRAVENOUS
  Administered 2019-07-27: 0.6 ug/kg/h via INTRAVENOUS
  Administered 2019-07-28: 1 ug/kg/h via INTRAVENOUS
  Administered 2019-07-28: 0.6 ug/kg/h via INTRAVENOUS
  Administered 2019-07-28: 0.7 ug/kg/h via INTRAVENOUS
  Administered 2019-07-29 – 2019-07-30 (×2): 0.5 ug/kg/h via INTRAVENOUS
  Administered 2019-07-30 – 2019-08-01 (×4): 0.4 ug/kg/h via INTRAVENOUS
  Filled 2019-07-26 (×11): qty 100
  Filled 2019-07-26: qty 200
  Filled 2019-07-26: qty 100

## 2019-07-26 MED ORDER — MIDAZOLAM HCL 2 MG/2ML IJ SOLN
2.0000 mg | INTRAMUSCULAR | Status: DC | PRN
  Administered 2019-07-26 – 2019-08-13 (×14): 2 mg via INTRAVENOUS
  Filled 2019-07-26 (×15): qty 2

## 2019-07-26 NOTE — Progress Notes (Addendum)
hypertensive and tachycardic. Propofol changed over to precedex today. Propofol order still active. Will add propofol back and wean in morning for neuro evaluation.  Episode of AFib with RVR and hypotension following the initiation of propofol. All sedation held and his BP recovered and converted back to NSR. Sedation slowly titrated back on for comfort and stable VS

## 2019-07-26 NOTE — Progress Notes (Signed)
NAME:  Steve [name], MRN:  [id number], DOB:  June 04, 1955, LOS: 36 ADMISSION DATE:  07/08/2019, CONSULTATION DATE:  3/29  REFERRING MD:  [name], CHIEF COMPLAINT:  dyspnea   Brief History   64 y/o male, with prior COVID infection 05/2019, admitted with severe acute respiratory failure with hypoxemia in the setting of receiving chemotherapy for Hodgkin's lymphoma.    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 ARDS. Bilateral PTX s/p CTx2  Consults:  ID >> s/o 4/09 Oncology >> s/o 4/05  Procedures:    Significant Diagnostic Tests:   CTA chest 3/29 > no PE, diffuse GGO upper lobes, patchy consolidation in bases, peripheral fibrotic changes noted on prior CT chest still present  ECHO 3/30 > LVEF 60 to 03%, grade 1 diastolic dysfunction.  Mildly dilated LA, normal RV. Trivial MR and TR, otherwise normal valves  Micro Data:  UC 3/29 >> negative  BCx2 3/29 >> negative  PJP 4/2 >> negative  Fungitel 3/29 >> positive Blastomyces Antigen 3/20 >> negative Respiratory Culture 4/4 >> staph epidermis >> S-vanco, clinda BAL 4/8 >>  Fungal (BAL) 4/8 >> Candida glabrata PJP (BAL) 4/8 >> negative  Aspergillus antigen (BAL) 4/8 >> Negative  Antimicrobials:  Cefepime 3/29 >> 3/31 Vanc 3/29 >> 3/30 Azithromycin 3/29 >> 4/1 Fluconazole 3/29 >> 4/13 Meropenem 3/31 >> 4/6 Bactrim 3/31 >> 4/4  Clindamycin 4/5 >>  Primaquine 4/5 >>  Anidulafungin 4/13 >>   Interim history/subjective:   Remains on the vent.   PEEP and FiO2 marginally better at 45% and 8 respectively  Objective   Blood pressure (!) 122/52, pulse 93, temperature 98.2 F (36.8 C), resp. rate (!) 35, height _0  (1.676 m), weight  73.2 kg, SpO2 92 %.    Vent Mode: PRVC FiO2 (%):  [45 %-50 %] 45 % Set Rate:  [35 bmp] 35 bmp Vt Set:  [380 mL] 380 mL PEEP:  [8 cmH20] 8 cmH20 Plateau Pressure:  [20 cmH20-33 cmH20] 20 cmH20   Intake/Output Summary (Last 24 hours) at 07/26/2019 0917 Last data filed at 07/26/2019 0600 Gross per 24 hour  Intake 1984.31 ml  Output 2600 ml  Net -615.69 ml   Filed Weights   07/24/19 0641 07/25/19 0500 07/26/19 0424  Weight: 73.9 kg 73.2 kg 73.2 kg    Examination: Gen:      No acute distress HEENT:  EOMI, sclera anicteric Neck:     No masses; no thyromegaly, ETT Lungs:    Clear to auscultation bilaterally; normal respiratory effort, bilateral chest tubes in place CV:         Regular rate and rhythm; no murmurs Abd:      + bowel sounds; soft, non-tender; no palpable masses, no distension Ext:    1+ edema; adequate peripheral perfusion Skin:      Warm and dry; no rash Neuro: Sedated  Lab significant for BUN/creatinine 44/0.44, WBC 13.1, hemoglobin 7.9 Chest x-ray 4/15-chest tube and ET tube in stable position.  Trace left apical pneumothorax  Resolved Hospital Problem list     Assessment & Plan:   Acute hypoxic/hypercapnic respiratory failure with ARDS. - differential includes post-COVID pneumonitis, drug reaction from brentuximab, infectious process - Brentuximab carries 5% risk of pulmonary toxicity which occurs  weeks into administration, treated with steroids and with holding agent - PJP stain negative 4/5.  Serum fungitel positive. Serum crypto, urine histo, legionella negative. S/P FOB on 4/8.  Repeat FOB COVID testing negative. Continue ARDSnet ventilation Goal plateau pressure less than 30, driving pressure less than 15 Permissive hypercarbia On day 11/21 of clindamycin and primaquine per ID as suspicion for PJP is high ; would then need daily bactrim DS prophylaxis Start slow steroid taper. Solumedrol changed to 60 gm qd on 4/15 Start lasix for diuresis as he appears  vol overloaded  Thrush, C glabrata in BAL Continue anidulafungin given positive fungitell test He has previously received Diflucan for thrush.  Bilateral spontaneous pneumothoraces with subcutaneous emphysema. Continue chest tubes to suction Follow-up chest x-ray  Acute metabolic encephalopathy 2nd to hypoxia/hypercapnia. Lighten sedation. Rass goal to -1 Precedex for sedation  DM type II poorly controlled with steroid induced hyperglycemia. Resistant scale of SSI with lantus and tube feed coverage  Anemia of critical illness and chronic disease. Hx of Hodgkin's lymphoma. - f/u CBC - transfuse for Hb < 7 or significant bleeding  HLD. -continue lipitor   Severe Protein Calorie Malnutrition. Continue tube feeds  Physical deconditioning. Passive ROM   Goals of care Discussed with wife at bedside 4/15.  Explained that we are trying to wean him off the ventilator but may be a long process given his prior Covid infection, ongoing lung inflammation secondary to pneumonitis. Discussed need for tracheostomy if we are not able to get him off the ventilator by next week.  His family is aware of what that entails and would be okay for the procedure as a last resort.  Best practice:  Diet: TF DVT prophylaxis: lovenox GI prophylaxis: PPI  Mobility: BR Code Status: Full code.  See above Disposition: ICU  Critical care time:    The patient is critically ill with multiple organ systems failure and requires high complexity decision making for assessment and support, frequent evaluation and titration of therapies, application of advanced monitoring technologies and extensive interpretation of multiple databases. Critical Care Time devoted to patient care services described in this note independent of APP/resident time (if applicable)  is 30 minutes.   Sherrilyn Rist MD [facility] Personal pager: [phone] If unanswered, please page CCM On-call: [phone]

## 2019-07-27 ENCOUNTER — Inpatient Hospital Stay (HOSPITAL_COMMUNITY): Payer: BC Managed Care – PPO

## 2019-07-27 DIAGNOSIS — L8990 Pressure ulcer of unspecified site, unspecified stage: Secondary | ICD-10-CM | POA: Insufficient documentation

## 2019-07-27 DIAGNOSIS — J189 Pneumonia, unspecified organism: Secondary | ICD-10-CM | POA: Diagnosis not present

## 2019-07-27 DIAGNOSIS — J9601 Acute respiratory failure with hypoxia: Secondary | ICD-10-CM | POA: Diagnosis not present

## 2019-07-27 LAB — CBC WITH DIFFERENTIAL/PLATELET
Abs Immature Granulocytes: 0.31 10*3/uL — ABNORMAL HIGH (ref 0.00–0.07)
Basophils Absolute: 0 10*3/uL (ref 0.0–0.1)
Basophils Relative: 0 %
Eosinophils Absolute: 0.1 10*3/uL (ref 0.0–0.5)
Eosinophils Relative: 1 %
HCT: 29.6 % — ABNORMAL LOW (ref 39.0–52.0)
Hemoglobin: 9.1 g/dL — ABNORMAL LOW (ref 13.0–17.0)
Immature Granulocytes: 2 %
Lymphocytes Relative: 5 %
Lymphs Abs: 1 10*3/uL (ref 0.7–4.0)
MCH: 31.3 pg (ref 26.0–34.0)
MCHC: 30.7 g/dL (ref 30.0–36.0)
MCV: 101.7 fL — ABNORMAL HIGH (ref 80.0–100.0)
Monocytes Absolute: 1.2 10*3/uL — ABNORMAL HIGH (ref 0.1–1.0)
Monocytes Relative: 7 %
Neutro Abs: 15.9 10*3/uL — ABNORMAL HIGH (ref 1.7–7.7)
Neutrophils Relative %: 85 %
Platelets: 193 10*3/uL (ref 150–400)
RBC: 2.91 MIL/uL — ABNORMAL LOW (ref 4.22–5.81)
RDW: 20.4 % — ABNORMAL HIGH (ref 11.5–15.5)
WBC: 18.5 10*3/uL — ABNORMAL HIGH (ref 4.0–10.5)
nRBC: 0.1 % (ref 0.0–0.2)

## 2019-07-27 LAB — BASIC METABOLIC PANEL
Anion gap: 8 (ref 5–15)
BUN: 37 mg/dL — ABNORMAL HIGH (ref 8–23)
CO2: 48 mmol/L — ABNORMAL HIGH (ref 22–32)
Calcium: 8.1 mg/dL — ABNORMAL LOW (ref 8.9–10.3)
Chloride: 87 mmol/L — ABNORMAL LOW (ref 98–111)
Creatinine, Ser: 0.37 mg/dL — ABNORMAL LOW (ref 0.61–1.24)
GFR calc Af Amer: >60 mL/min (ref >60)
GFR calc non Af Amer: >60 mL/min (ref >60)
Glucose, Bld: 109 mg/dL — ABNORMAL HIGH (ref 70–99)
Potassium: 3.6 mmol/L (ref 3.5–5.1)
Sodium: 143 mmol/L (ref 135–145)

## 2019-07-27 LAB — GLUCOSE, CAPILLARY
Glucose-Capillary: 96 mg/dL (ref 70–99)
Glucose-Capillary: 130 mg/dL — ABNORMAL HIGH (ref 70–99)
Glucose-Capillary: 144 mg/dL — ABNORMAL HIGH (ref 70–99)
Glucose-Capillary: 83 mg/dL (ref 70–99)
Glucose-Capillary: 156 mg/dL — ABNORMAL HIGH (ref 70–99)

## 2019-07-27 LAB — TRIGLYCERIDES: Triglycerides: 90 mg/dL (ref <150)

## 2019-07-27 MED ORDER — HYDROMORPHONE BOLUS VIA INFUSION
1.0000 mg | INTRAVENOUS | Status: DC | PRN
  Filled 2019-07-27: qty 1

## 2019-07-27 MED ORDER — SODIUM CHLORIDE 0.9 % IV SOLN
1.0000 mg/h | INTRAVENOUS | Status: DC
  Administered 2019-07-27: 2 mg/h via INTRAVENOUS
  Filled 2019-07-27 (×2): qty 5

## 2019-07-27 NOTE — Progress Notes (Signed)
At 22:30 pm, patient was found to be hypertensive and tachycardic. Sedation may have been the caused so propofol was restarted due to order still active. Will wean sedation as necessary.   After propofol was restarted, an episode of AFivb with RVR and hypotension. All sedation was held and BP recovered and converted back to ST. Sedation has been slowly titrated back.

## 2019-07-27 NOTE — Progress Notes (Signed)
NAME:  Steve [name], MRN:  [id number], DOB:  February 19, 1956, LOS: 50 ADMISSION DATE:  07/08/2019, CONSULTATION DATE:  3/29 REFERRING MD:  [name], CHIEF COMPLAINT:  dyspnea   Brief History   64 y/o male, with prior COVID infection 05/2019, admitted with severe acute respiratory failure with hypoxemia in the setting of receiving chemotherapy for Hodgkin's lymphoma.     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 ARDS. Bilateral PTX s/p CTx2  Consults:  ID >> s/o 4/09 Oncology >> s/o 4/05  Procedures:    Significant Diagnostic Tests:   CTA chest 3/29 > no PE, diffuse GGO upper lobes, patchy consolidation in bases, peripheral fibrotic changes noted on prior CT chest still present  ECHO 3/30 > LVEF 60 to 87%, grade 1 diastolic dysfunction. Mildly dilated LA, normal RV. Trivial MR and TR,otherwise normal valves  Micro Data:  UC 3/29 >> negative  BCx2 3/29 >> negative  PJP 4/2 >> negative  Fungitel 3/29 >> positive Blastomyces Antigen 3/20 >> negative Respiratory Culture 4/4 >> staph epidermis >> S-vanco, clinda BAL 4/8 >>  Fungal (BAL) 4/8 >> Candida glabrata PJP (BAL) 4/8 >> negative  Aspergillus antigen (BAL) 4/8 >> Negative  Antimicrobials:  Cefepime 3/29 >>3/31 Vanc 3/29 >>3/30 Azithromycin 3/29 >> 4/1 Fluconazole 3/29 >> 4/13 Meropenem 3/31 >> 4/6 Bactrim 3/31 >> 4/4  Clindamycin 4/5 >>  Primaquine 4/5 >>  Anidulafungin 4/13 >>  Interim history/subjective:   More neck and chest swelling today  Objective   Blood pressure (!) 157/81, pulse (!) 101, temperature 98.4 F (36.9 C), resp. rate (!) 24, height _0  (1.676 m), weight 72.2 kg, SpO2 94 %.    Vent Mode:  PRVC FiO2 (%):  [40 %-50 %] 50 % Set Rate:  [35 bmp] 35 bmp Vt Set:  [380 mL] 380 mL PEEP:  [8 cmH20] 8 cmH20 Plateau Pressure:  [20 cmH20-29 cmH20] 24 cmH20   Intake/Output Summary (Last 24 hours) at 07/27/2019 0805 Last data filed at 07/27/2019 0600 Gross per 24 hour  Intake 672.77 ml  Output 5370 ml  Net -4697.23 ml   Filed Weights   07/25/19 0500 07/26/19 0424 07/27/19 0457  Weight: 73.2 kg 73.2 kg 72.2 kg    Examination:  General:  In bed on vent, increased work of breathing, nasal flaring, gasping for air HENT: NCAT ETT in place PULM: Crackles bases B, vent supported breathing CV: RRR, no mgr GI: BS+, soft, nontender MSK: normal bulk and tone Derm: massive sub cutaneous air neck/chest Neuro: sedated on vent    Resolved Hospital Problem list     Assessment & Plan:  ARDS of uncertain etiology - differential includes post-COVID pneumonitis, drug reaction from brentuximab, infectious process - Brentuximab carries 5% risk of pulmonary toxicity which occurs weeks into administration, treated with steroids and with holding agent 4/17 Plateau 27 cm H20 Prognosis: will take weeks for lungs to completely recover, unclear if the rest of his body can survive during that time frame. Full mechanical vent support VAP prevention Daily WUA/SBT Decrease RR to reduce auto-peep Continue to monitor plateuau Continue solumedrol as ordered  C glabrata in BAL Continue anidulofungin  Spontaneous pneumothoraces Increased subcutaneous air Stripped left chest tube today Increase suction on both tubes to -  30cm V40  Acute metabolic encephalopathy Need for sedation for mechanical ventilation Fentanyl tachyphylaxis Change fentanyl to dilaudid Stop propofol Wean down dilaudid and focus on precedex as primary sedative for weekend  Anemia of chronic illness Hodgkin's lymphoma Monitor for bleeding Transfuse PRBC for Hgb < 7 gm/dL  Hyperlipidemia Continue statin  Severe protein  cal malnutrition Continue tube feeding  Physical deconditioning Passive range of motion exercises   Best practice:  Diet: continue tube feeding Pain/Anxiety/Delirium protocol (if indicated): yes VAP protocol (if indicated): yes DVT prophylaxis: lovenox GI prophylaxis: Pantoprazole for stress ulcer prophylaxis Glucose control: SSI Mobility: bed rest Code Status: full Family Communication: I updated his wife [name] at bedside at length and advised her that I think this will take weeks to recover and i'm not certain if his body will be able to tolerate that.  She acknowledged what I told her and desires full medical support.  Disposition: remain in ICU  Labs   CBC: Recent Labs  Lab 07/22/19 0345 07/23/19 0740 07/25/19 0543 07/26/19 0300 07/27/19 0329  WBC 9.4 10.4 13.1* 15.0* 18.5*  NEUTROABS  --   --   --   --  15.9*  HGB 7.6* 7.9* 7.9* 8.2* 9.1*  HCT 26.1* 27.7* 27.6* 27.9* 29.6*  MCV 103.2* 103.4* 104.2* 103.7* 101.7*  PLT 201 231 202 175 981    Basic Metabolic Panel: Recent Labs  Lab 07/22/19 0345 07/23/19 0740 07/25/19 0543 07/26/19 0300 07/27/19 0329  NA 144 142 142 142 143  K 4.9 4.9 4.5 3.6 3.6  CL 95* 92* 94* 91* 87*  CO2 43* 44* 45* 45* 48*  GLUCOSE 128* 147* 141* 100* 109*  BUN 45* 42* 44* 40* 37*  CREATININE 0.33* 0.47* 0.44* 0.45* 0.37*  CALCIUM 8.8* 8.9 8.4* 8.1* 8.1*  MG  --   --  2.0 1.7  --   PHOS  --   --  3.2 2.3*  --    GFR: Estimated Creatinine Clearance: 85.3 mL/min (A) (by C-G formula based on SCr of 0.37 mg/dL (L)). Recent Labs  Lab 07/23/19 0740 07/25/19 0543 07/26/19 0300 07/27/19 0329  WBC 10.4 13.1* 15.0* 18.5*    Liver Function Tests: No results for input(s): AST, ALT, ALKPHOS, BILITOT, PROT, ALBUMIN in the last 168 hours. No results for input(s): LIPASE, AMYLASE in the last 168 hours. No results for input(s): AMMONIA in the last 168 hours.  ABG    Component Value Date/Time   PHART 7.411 07/26/2019 1620   PCO2ART  78.3 (HH) 07/26/2019 1620   PO2ART 71.9 (L) 07/26/2019 1620   HCO3 48.7 (H) 07/26/2019 1620   TCO2 28 07/08/2019 1331   O2SAT 94.2 07/26/2019 1620     Coagulation Profile: No results for input(s): INR, PROTIME in the last 168 hours.  Cardiac Enzymes: No results for input(s): CKTOTAL, CKMB, CKMBINDEX, TROPONINI in the last 168 hours.  HbA1C: Hgb A1c MFr Bld  Date/Time Value Ref Range Status  07/09/2019 05:10 AM 7.3 (H) 4.8 - 5.6 % Final    Comment:    (NOTE) Pre diabetes:          5.7%-6.4% Diabetes:              >6.4% Glycemic control for   <7.0% adults with diabetes   05/19/2019 03:56 AM 8.3 (H) 4.8 - 5.6 % Final    Comment:    (NOTE) Pre diabetes:          5.7%-6.4% Diabetes:              >  6.4% Glycemic control for   <7.0% adults with diabetes     CBG: Recent Labs  Lab 07/26/19 1526 07/26/19 1929 07/26/19 2307 07/27/19 0325 07/27/19 0728  GLUCAP 198* 194* 179* 96 130*     Critical care time: 36 minutes     [name], MD [facility] Pager: [phone] Cell: [phone] If no response, call [phone]

## 2019-07-27 NOTE — Progress Notes (Signed)
eLink Physician-Brief Progress Note Patient Name: YSMAEL HIRES DOB: June 29, 1955 MRN: [id number]   Date of Service  07/27/2019  HPI/Events of Note  Nursing request for AM labs and CXR.  eICU Interventions  Will order: 1. CBC with platelets and BMP at 5 AM. 2. Portable CXR at 5 AM.     Intervention Category Major Interventions: Other:  [name] Cornelia Copa 07/27/2019, 1:24 AM

## 2019-07-27 NOTE — Progress Notes (Signed)
CCM ordered RR to 26 bpm and to remain on 6 cc VT.

## 2019-07-28 ENCOUNTER — Inpatient Hospital Stay (HOSPITAL_COMMUNITY): Payer: BC Managed Care – PPO

## 2019-07-28 DIAGNOSIS — C8100 Nodular lymphocyte predominant Hodgkin lymphoma, unspecified site: Secondary | ICD-10-CM | POA: Diagnosis not present

## 2019-07-28 DIAGNOSIS — J9601 Acute respiratory failure with hypoxia: Secondary | ICD-10-CM | POA: Diagnosis not present

## 2019-07-28 DIAGNOSIS — J189 Pneumonia, unspecified organism: Secondary | ICD-10-CM | POA: Diagnosis not present

## 2019-07-28 LAB — GLUCOSE, CAPILLARY
Glucose-Capillary: 103 mg/dL — ABNORMAL HIGH (ref 70–99)
Glucose-Capillary: 118 mg/dL — ABNORMAL HIGH (ref 70–99)
Glucose-Capillary: 155 mg/dL — ABNORMAL HIGH (ref 70–99)
Glucose-Capillary: 157 mg/dL — ABNORMAL HIGH (ref 70–99)
Glucose-Capillary: 194 mg/dL — ABNORMAL HIGH (ref 70–99)
Glucose-Capillary: 195 mg/dL — ABNORMAL HIGH (ref 70–99)
Glucose-Capillary: 99 mg/dL (ref 70–99)

## 2019-07-28 LAB — COMPREHENSIVE METABOLIC PANEL
ALT: 38 U/L (ref 0–44)
AST: 32 U/L (ref 15–41)
Albumin: 2.3 g/dL — ABNORMAL LOW (ref 3.5–5.0)
Alkaline Phosphatase: 76 U/L (ref 38–126)
Anion gap: 9 (ref 5–15)
BUN: 34 mg/dL — ABNORMAL HIGH (ref 8–23)
CO2: 46 mmol/L — ABNORMAL HIGH (ref 22–32)
Calcium: 8.3 mg/dL — ABNORMAL LOW (ref 8.9–10.3)
Chloride: 84 mmol/L — ABNORMAL LOW (ref 98–111)
Creatinine, Ser: 0.32 mg/dL — ABNORMAL LOW (ref 0.61–1.24)
GFR calc Af Amer: >60 mL/min (ref >60)
GFR calc non Af Amer: >60 mL/min (ref >60)
Glucose, Bld: 95 mg/dL (ref 70–99)
Potassium: 3.3 mmol/L — ABNORMAL LOW (ref 3.5–5.1)
Sodium: 139 mmol/L (ref 135–145)
Total Bilirubin: 0.6 mg/dL (ref 0.3–1.2)
Total Protein: 5.9 g/dL — ABNORMAL LOW (ref 6.5–8.1)

## 2019-07-28 LAB — CBC WITH DIFFERENTIAL/PLATELET
Abs Immature Granulocytes: 0.16 10*3/uL — ABNORMAL HIGH (ref 0.00–0.07)
Basophils Absolute: 0 10*3/uL (ref 0.0–0.1)
Basophils Relative: 0 %
Eosinophils Absolute: 0.1 10*3/uL (ref 0.0–0.5)
Eosinophils Relative: 1 %
HCT: 28.8 % — ABNORMAL LOW (ref 39.0–52.0)
Hemoglobin: 8.5 g/dL — ABNORMAL LOW (ref 13.0–17.0)
Immature Granulocytes: 1 %
Lymphocytes Relative: 5 %
Lymphs Abs: 0.8 10*3/uL (ref 0.7–4.0)
MCH: 30.5 pg (ref 26.0–34.0)
MCHC: 29.5 g/dL — ABNORMAL LOW (ref 30.0–36.0)
MCV: 103.2 fL — ABNORMAL HIGH (ref 80.0–100.0)
Monocytes Absolute: 1 10*3/uL (ref 0.1–1.0)
Monocytes Relative: 6 %
Neutro Abs: 15.1 10*3/uL — ABNORMAL HIGH (ref 1.7–7.7)
Neutrophils Relative %: 87 %
Platelets: 152 10*3/uL (ref 150–400)
RBC: 2.79 MIL/uL — ABNORMAL LOW (ref 4.22–5.81)
RDW: 20.1 % — ABNORMAL HIGH (ref 11.5–15.5)
WBC: 17.3 10*3/uL — ABNORMAL HIGH (ref 4.0–10.5)
nRBC: 0 % (ref 0.0–0.2)

## 2019-07-28 MED ORDER — HYDROMORPHONE HCL 1 MG/ML IJ SOLN
0.5000 mg | INTRAMUSCULAR | Status: DC | PRN
  Administered 2019-07-28 (×2): 1 mg via INTRAVENOUS
  Administered 2019-07-29: 0.5 mg via INTRAVENOUS
  Administered 2019-07-30 – 2019-08-02 (×4): 1 mg via INTRAVENOUS
  Filled 2019-07-28 (×6): qty 1

## 2019-07-28 MED ORDER — PROPOFOL 1000 MG/100ML IV EMUL
5.0000 ug/kg/min | INTRAVENOUS | Status: DC
  Administered 2019-07-29: 5 ug/kg/min via INTRAVENOUS
  Filled 2019-07-28: qty 100

## 2019-07-28 MED ORDER — POLYETHYLENE GLYCOL 3350 17 G PO PACK
17.0000 g | PACK | Freq: Every day | ORAL | Status: DC
  Filled 2019-07-28: qty 1

## 2019-07-28 MED ORDER — POTASSIUM CHLORIDE 20 MEQ/15ML (10%) PO SOLN
20.0000 meq | ORAL | Status: AC
  Administered 2019-07-28 (×2): 20 meq
  Filled 2019-07-28 (×3): qty 15

## 2019-07-28 MED ORDER — DOCUSATE SODIUM 50 MG/5ML PO LIQD
100.0000 mg | Freq: Two times a day (BID) | ORAL | Status: DC
  Administered 2019-07-28 – 2019-08-02 (×2): 100 mg via ORAL
  Filled 2019-07-28 (×4): qty 10

## 2019-07-28 NOTE — Progress Notes (Signed)
NAME:  Steve [name], MRN:  [id number], DOB:  [birth year], LOS: 25 ADMISSION DATE:  07/08/2019, CONSULTATION DATE:  3/29 REFERRING MD:  [name], CHIEF COMPLAINT:  dyspnea   Brief History   65 y/o male, with prior COVID infection 05/2019, admitted with severe acute respiratory failure with hypoxemia in the setting of receiving chemotherapy for Hodgkin's lymphoma.     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 ARDS. Bilateral PTX s/p CTx2  Consults:  ID >> s/o 4/09 Oncology >> s/o 4/05  Procedures:    Significant Diagnostic Tests:   CTA chest 3/29 > no PE, diffuse GGO upper lobes, patchy consolidation in bases, peripheral fibrotic changes noted on prior CT chest still present  ECHO 3/30 > LVEF 60 to 70%, grade 1 diastolic dysfunction. Mildly dilated LA, normal RV. Trivial MR and TR,otherwise normal valves  Micro Data:  UC 3/29 >> negative  BCx2 3/29 >> negative  PJP 4/2 >> negative  Fungitel 3/29 >> positive Blastomyces Antigen 3/20 >> negative Respiratory Culture 4/4 >> staph epidermis >> S-vanco, clinda BAL 4/8 >>  Fungal (BAL) 4/8 >> Candida glabrata PJP (BAL) 4/8 >> negative  Aspergillus antigen (BAL) 4/8 >> Negative  Antimicrobials:  Cefepime 3/29 >>3/31 Vanc 3/29 >>3/30 Azithromycin 3/29 >> 4/1 Fluconazole 3/29 >> 4/13 Meropenem 3/31 >> 4/6 Bactrim 3/31 >> 4/4  Clindamycin 4/5 >>  Primaquine 4/5 >>  Anidulafungin 4/13 >>  Interim history/subjective:   Weaned on vent some this morning Still has significant facial swelling  Objective   Blood pressure 126/76, pulse 94, temperature 98.4 F (36.9 C), resp. rate (!) 26, height _0  (1.676 m), weight 72.6 kg, SpO2  98 %.    Vent Mode: PRVC FiO2 (%):  [40 %-50 %] 50 % Set Rate:  [26 bmp-35 bmp] 26 bmp Vt Set:  [380 mL] 380 mL PEEP:  [8 cmH20] 8 cmH20 Plateau Pressure:  [25 cmH20-29 cmH20] 25 cmH20   Intake/Output Summary (Last 24 hours) at 07/28/2019 0744 Last data filed at 07/28/2019 0600 Gross per 24 hour  Intake 2877.77 ml  Output 3725 ml  Net -847.23 ml   Filed Weights   07/26/19 0424 07/27/19 0457 07/28/19 0500  Weight: 73.2 kg 72.2 kg 72.6 kg    Examination:  General:  In bed on vent HENT: facial swelling/neck swelling with sub cutaneous crepitance, ETT in place PULM: Crackles bases, vent supported breathing CV: RRR, no mgr GI: BS+, soft, nontender MSK: normal bulk and tone Neuro: sedated on vent   Resolved Hospital Problem list     Assessment & Plan:  ARDS of uncertain etiology - differential includes post-COVID pneumonitis, drug reaction from brentuximab, infectious process - Brentuximab carries 5% risk of pulmonary toxicity which occurs weeks into administration, treated with steroids and with holding agent 4/18 weaned on pressure support for an hour Prognosis: will take weeks for lungs to completely recover, unclear if the rest of his body can survive during that time frame. Pressure support as long as tolerated Changed dilaudid to prn Otherwise, continue full mechanical vent support VAP prevention Daily WUA/SBT Continue solumedrol Continue to monitor plateau pressure  C glabrata in BAL Continue anidulofungin  Spontaneous pneumothoraces Pneumomediastinum Continue chest tubes to -30cm H2O Repeat CXR in AM  Acute metabolic encephalopathy > improved 4/18 Need for sedation for mechanical ventilation Fentanyl tachyphylaxis Change dilaudid to prn Continue precedex RASS target 0 to -1 Add PAD protocol  Anemia of chronic illness Hodgkin's lymphoma Monitor for bleeding Transfuse PRBC for Hgb < 7 gm/dL  Hyperlipidemia Continue statin  Severe protein cal  malnutrition Continue tube feeding  Physical deconditioning Passive range of motion exercises   Best practice:  Diet: continue tube feeding Pain/Anxiety/Delirium protocol (if indicated): yes VAP protocol (if indicated): yes DVT prophylaxis: lovenox GI prophylaxis: Pantoprazole for stress ulcer prophylaxis Glucose control: SSI Mobility: bed rest Code Status: full Family Communication: 4/17 > I updated his wife [name] at bedside at length and advised her that I think this will take weeks to recover and I'm not certain if his body will be able to tolerate that.  She acknowledged what I told her and desires full medical support. 4/18 > updated wife, re-iterated that this may not go well and she needs to be prepared for his death.  She says that she is ready for the long haul. Disposition: remain in ICU  Labs   CBC: Recent Labs  Lab 07/23/19 0740 07/25/19 0543 07/26/19 0300 07/27/19 0329 07/28/19 0417  WBC 10.4 13.1* 15.0* 18.5* 17.3*  NEUTROABS  --   --   --  15.9* 15.1*  HGB 7.9* 7.9* 8.2* 9.1* 8.5*  HCT 27.7* 27.6* 27.9* 29.6* 28.8*  MCV 103.4* 104.2* 103.7* 101.7* 103.2*  PLT 231 202 175 193 213    Basic Metabolic Panel: Recent Labs  Lab 07/23/19 0740 07/25/19 0543 07/26/19 0300 07/27/19 0329 07/28/19 0417  NA 142 142 142 143 139  K 4.9 4.5 3.6 3.6 3.3*  CL 92* 94* 91* 87* 84*  CO2 44* 45* 45* 48* 46*  GLUCOSE 147* 141* 100* 109* 95  BUN 42* 44* 40* 37* 34*  CREATININE 0.47* 0.44* 0.45* 0.37* 0.32*  CALCIUM 8.9 8.4* 8.1* 8.1* 8.3*  MG  --  2.0 1.7  --   --   PHOS  --  3.2 2.3*  --   --    GFR: Estimated Creatinine Clearance: 85.3 mL/min (A) (by C-G formula based on SCr of 0.32 mg/dL (L)). Recent Labs  Lab 07/25/19 0543 07/26/19 0300 07/27/19 0329 07/28/19 0417  WBC 13.1* 15.0* 18.5* 17.3*    Liver Function Tests: Recent Labs  Lab 07/28/19 0417  AST 32  ALT 38  ALKPHOS 76  BILITOT 0.6  PROT 5.9*  ALBUMIN 2.3*   No results for input(s):  LIPASE, AMYLASE in the last 168 hours. No results for input(s): AMMONIA in the last 168 hours.  ABG    Component Value Date/Time   PHART 7.411 07/26/2019 1620   PCO2ART 78.3 (HH) 07/26/2019 1620   PO2ART 71.9 (L) 07/26/2019 1620   HCO3 48.7 (H) 07/26/2019 1620   TCO2 28 07/08/2019 1331   O2SAT 94.2 07/26/2019 1620     Coagulation Profile: No results for input(s): INR, PROTIME in the last 168 hours.  Cardiac Enzymes: No results for input(s): CKTOTAL, CKMB, CKMBINDEX, TROPONINI in the last 168 hours.  HbA1C: Hgb A1c MFr Bld  Date/Time Value Ref Range Status  07/09/2019 05:10 AM 7.3 (H) 4.8 - 5.6 % Final    Comment:    (NOTE) Pre diabetes:          5.7%-6.4% Diabetes:              >6.4% Glycemic control for   <7.0% adults with diabetes   05/19/2019 03:56  AM 8.3 (H) 4.8 - 5.6 % Final    Comment:    (NOTE) Pre diabetes:          5.7%-6.4% Diabetes:              >6.4% Glycemic control for   <7.0% adults with diabetes     CBG: Recent Labs  Lab 07/27/19 1606 07/27/19 2007 07/28/19 0013 07/28/19 0315 07/28/19 0732  GLUCAP 83 156* 155* 103* 99     Critical care time: 40 minutes     [name], MD [place] Pager: [phone] Cell: [phone] If no response, call [phone]

## 2019-07-28 NOTE — Progress Notes (Addendum)
eLink Physician-Brief Progress Note Patient Name: [name] DOB: Jan 03, 1956 MRN: [id number]   Date of Service  07/28/2019  HPI/Events of Note  RR up. Dilaudid prn, was on continuous gtt before. Covid PNA. \Camera: RR > 30. 380/8/26. sats 91%. SBP 170. HR 97. In synchrony.  Discussed with RN.   eICU Interventions  - get stat CxR/ABG - start Propofol, watch for TG level and low BP - on maxed on precedex.      Intervention Category Intermediate Interventions: Respiratory distress - evaluation and management Minor Interventions: Communication with other healthcare providers and/or family  Elmer Sow 07/28/2019, 11:39 PM   00:40 ABG stable type 2 resp failure with hypercarbia. Permissive hypercapnea. CxR both ICD holes in place. Stable sy=ub emphysema.  Continue care. Ph 7.39.

## 2019-07-28 NOTE — Progress Notes (Signed)
[facility] ADULT ICU REPLACEMENT PROTOCOL FOR AM LAB REPLACEMENT ONLY  The patient does apply for the [facility] Adult ICU Electrolyte Replacment Protocol based on the criteria listed below:   1. Is GFR >/= 40 ml/min? Yes.    Patient's GFR today is >60 2. Is urine output >/= 0.5 ml/kg/hr for the last 6 hours? Yes.   Patient's UOP is 3.4 ml/kg/hr 3. Is BUN < 60 mg/dL? Yes.    Patient's BUN today is 34 4. Abnormal electrolyte(s): k 3.3  5. Ordered repletion with: protocol per tube 6. If a panic level lab has been reported, has the CCM MD in charge been notified? No..   Physician:    Ronda Fairly A 07/28/2019 6:33 AM

## 2019-07-29 DIAGNOSIS — J189 Pneumonia, unspecified organism: Secondary | ICD-10-CM | POA: Diagnosis not present

## 2019-07-29 LAB — COMPREHENSIVE METABOLIC PANEL
ALT: 38 U/L (ref 0–44)
AST: 31 U/L (ref 15–41)
Albumin: 2.1 g/dL — ABNORMAL LOW (ref 3.5–5.0)
Alkaline Phosphatase: 89 U/L (ref 38–126)
Anion gap: 7 (ref 5–15)
BUN: 30 mg/dL — ABNORMAL HIGH (ref 8–23)
CO2: 47 mmol/L — ABNORMAL HIGH (ref 22–32)
Calcium: 8.1 mg/dL — ABNORMAL LOW (ref 8.9–10.3)
Chloride: 84 mmol/L — ABNORMAL LOW (ref 98–111)
Creatinine, Ser: <0.3 mg/dL — ABNORMAL LOW (ref 0.61–1.24)
Glucose, Bld: 112 mg/dL — ABNORMAL HIGH (ref 70–99)
Potassium: 3.6 mmol/L (ref 3.5–5.1)
Sodium: 138 mmol/L (ref 135–145)
Total Bilirubin: 0.6 mg/dL (ref 0.3–1.2)
Total Protein: 5.6 g/dL — ABNORMAL LOW (ref 6.5–8.1)

## 2019-07-29 LAB — GLUCOSE, CAPILLARY
Glucose-Capillary: 100 mg/dL — ABNORMAL HIGH (ref 70–99)
Glucose-Capillary: 144 mg/dL — ABNORMAL HIGH (ref 70–99)
Glucose-Capillary: 154 mg/dL — ABNORMAL HIGH (ref 70–99)
Glucose-Capillary: 181 mg/dL — ABNORMAL HIGH (ref 70–99)
Glucose-Capillary: 172 mg/dL — ABNORMAL HIGH (ref 70–99)
Glucose-Capillary: 161 mg/dL — ABNORMAL HIGH (ref 70–99)

## 2019-07-29 LAB — BLOOD GAS, ARTERIAL
Acid-Base Excess: 23.5 mmol/L — ABNORMAL HIGH (ref 0.0–2.0)
Bicarbonate: 52.1 mmol/L — ABNORMAL HIGH (ref 20.0–28.0)
O2 Saturation: 91.8 %
Patient temperature: 99
pCO2 arterial: 87.5 mmHg (ref 32.0–48.0)
pH, Arterial: 7.393 (ref 7.350–7.450)
pO2, Arterial: 64.7 mmHg — ABNORMAL LOW (ref 83.0–108.0)

## 2019-07-29 LAB — CBC WITH DIFFERENTIAL/PLATELET
Abs Immature Granulocytes: 0.1 10*3/uL — ABNORMAL HIGH (ref 0.00–0.07)
Basophils Absolute: 0 10*3/uL (ref 0.0–0.1)
Basophils Relative: 0 %
Eosinophils Absolute: 0.1 10*3/uL (ref 0.0–0.5)
Eosinophils Relative: 1 %
HCT: 27.9 % — ABNORMAL LOW (ref 39.0–52.0)
Hemoglobin: 8.3 g/dL — ABNORMAL LOW (ref 13.0–17.0)
Immature Granulocytes: 1 %
Lymphocytes Relative: 6 %
Lymphs Abs: 0.9 10*3/uL (ref 0.7–4.0)
MCH: 30.3 pg (ref 26.0–34.0)
MCHC: 29.7 g/dL — ABNORMAL LOW (ref 30.0–36.0)
MCV: 101.8 fL — ABNORMAL HIGH (ref 80.0–100.0)
Monocytes Absolute: 0.9 10*3/uL (ref 0.1–1.0)
Monocytes Relative: 6 %
Neutro Abs: 13.1 10*3/uL — ABNORMAL HIGH (ref 1.7–7.7)
Neutrophils Relative %: 86 %
Platelets: 125 10*3/uL — ABNORMAL LOW (ref 150–400)
RBC: 2.74 MIL/uL — ABNORMAL LOW (ref 4.22–5.81)
RDW: 19.8 % — ABNORMAL HIGH (ref 11.5–15.5)
WBC: 15.1 10*3/uL — ABNORMAL HIGH (ref 4.0–10.5)
nRBC: 0 % (ref 0.0–0.2)

## 2019-07-29 LAB — TRIGLYCERIDES: Triglycerides: 71 mg/dL (ref <150)

## 2019-07-29 MED ORDER — SODIUM CHLORIDE 0.9 % IV SOLN
0.5000 mg/h | INTRAVENOUS | Status: DC
  Administered 2019-07-29 – 2019-07-30 (×2): 0.5 mg/h via INTRAVENOUS
  Administered 2019-07-31: 0.75 mg/h via INTRAVENOUS
  Filled 2019-07-29 (×3): qty 5

## 2019-07-29 MED ORDER — SODIUM CHLORIDE 0.9 % IV BOLUS
500.0000 mL | Freq: Once | INTRAVENOUS | Status: AC
  Administered 2019-07-29: 500 mL via INTRAVENOUS

## 2019-07-29 MED ORDER — ALBUMIN HUMAN 25 % IV SOLN
12.5000 g | Freq: Once | INTRAVENOUS | Status: AC
  Administered 2019-07-29: 12.5 g via INTRAVENOUS
  Filled 2019-07-29: qty 50

## 2019-07-29 NOTE — Progress Notes (Signed)
Upon assessment of the patient's heels this AM, this RN noticed an area of nonblanchable redness of the patient's right heel under the foam heel protector. Another RN, Rich Reining, came to confirm this finding with this RN. The patient's heel protectors have been replaced and the patient's feet are elevated. This RN will order pressure reducing boots for this patient to deter further deterioration.

## 2019-07-29 NOTE — Progress Notes (Addendum)
NAME:  Steve [name], MRN:  [id number], DOB:  1955/07/06, LOS: 16 ADMISSION DATE:  07/08/2019, CONSULTATION DATE:  3/29 REFERRING MD:  [name], CHIEF COMPLAINT:  dyspnea   Brief History   64 y/o male, with prior COVID infection 05/2019, admitted with severe acute respiratory failure with hypoxemia in the setting of receiving chemotherapy for Hodgkin's lymphoma.     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 ARDS. Bilateral PTX s/p CTx2 4/19 Remains on vent 60%/PEEP 8, likely to need trach   Consults:  ID >> s/o 4/09 Oncology >> s/o 4/05  Procedures:    Significant Diagnostic Tests:   CTA chest 3/29 > no PE, diffuse GGO upper lobes, patchy consolidation in bases, peripheral fibrotic changes noted on prior CT chest still present  ECHO 3/30 > LVEF 60 to 35%, grade 1 diastolic dysfunction. Mildly dilated LA, normal RV. Trivial MR and TR,otherwise normal valves  Micro Data:  UC 3/29 >> negative  BCx2 3/29 >> negative  PJP 4/2 >> negative  Fungitel 3/29 >> positive Blastomyces Antigen 3/20 >> negative Respiratory Culture 4/4 >> staph epidermis >> S-vanco, clinda BAL 4/8 >> candida glabrata Fungal (BAL) 4/8 >> Candida glabrata PJP (BAL) 4/8 >> negative  Aspergillus antigen (BAL) 4/8 >> Negative  Antimicrobials:  Cefepime 3/29 >>3/31 Vanc 3/29 >>3/30 Azithromycin 3/29 >> 4/1 Fluconazole 3/29 >> 4/13 Meropenem 3/31 >> 4/6 Bactrim 3/31 >> 4/4  Clindamycin 4/5 >>  Primaquine 4/5 >>  Anidulafungin 4/12  >>  Interim history/subjective:  Tmax 99  I/O 2.7L UOP, -89m in 24 hours  PEEP 8, FiO2 60% No acute events overnight.   Objective   Blood pressure 140/71, pulse 96,  temperature 99 F (37.2 C), resp. rate (!) 24, height _0  (1.676 m), weight 73 kg, SpO2 97 %.    Vent Mode: PRVC FiO2 (%):  [50 %-60 %] 60 % Set Rate:  [26 bmp] 26 bmp Vt Set:  [380 mL] 380 mL PEEP:  [8 cmH20] 8 cmH20 Plateau Pressure:  [26 cmH20-34 cmH20] 28 cmH20   Intake/Output Summary (Last 24 hours) at 07/29/2019 0941 Last data filed at 07/29/2019 0600 Gross per 24 hour  Intake 2275.26 ml  Output 3110 ml  Net -834.74 ml   Filed Weights   07/27/19 0457 07/28/19 0500 07/29/19 0500  Weight: 72.2 kg 72.6 kg 73 kg    Examination: General: ill appearing adult male lying in bed in NAD on vent   HEENT: MM pink/moist, ETT, neck / facial swelling with subcutaneous crepitance Neuro: awakens to voice, nods appropriately CV: s1s2 RRR, no m/r/g PULM:  Non-labored on vent, lungs bilaterally clear anterior, diminished bases with crackles  GI: soft, bsx4 active  Extremities: warm/dry, generalized trace to 1+ edema overall improved Skin: no rashes or lesions  CXR 4/19 >> ETT, chest tubes in good position, essentially unchanged airspace disease   Resolved Hospital Problem list     Assessment & Plan:   ARDS of Uncertain Etiology Suspect multifactorial in setting of post COVID, potential pulmonary toxicity with Brentuximab (carries ~ 5% risk), treated with steroids.  Suspect this will take weeks for his lungs to recover, unclear if he physically will be able to recover from ICU illness -PRVC 8cc/kg as rest mode  -  Wean PEEP / FiO2 for sats >88% -discussed concept of trach with wife who would want to pursue  -daily SBT / WUA -follow plateau, driving pressures  -intermittent CXR -continue solumedrol  -pt will complete PCP treatment 4/20  C glabrata in BAL -continue anidulafungin, will plan for 14 days given immunocompromised state  Spontaneous Pneumothoraces Pneumomediastinum -follow CXR -chest tube to suction  -chest tube care per protocol  Acute metabolic encephalopathy >  improved 4/18 Need for sedation for mechanical ventilation Fentanyl tachyphylaxis -continue precedex, dilaudid gtt -minimize sedation as able  -RASS Goal 0 to -1  Anemia of chronic illness Hodgkin's lymphoma -follow CBC -transfuse for Hgb <7%   Hyperlipidemia -continue statin   Severe protein cal malnutrition -TF per Nutrition   Physical deconditioning -PT/OT when able -passive ROM -chair position   Best practice:  Diet: TF Pain/Anxiety/Delirium protocol (if indicated): yes VAP protocol (if indicated): yes DVT prophylaxis: lovenox GI prophylaxis: PPI Glucose control: SSI Mobility: bed rest Code Status: full Family Communication: 4/19 - Wife updated at bedside.  She indicates she would want trach if necessary.  Disposition: ICU  Labs   CBC: Recent Labs  Lab 07/25/19 0543 07/26/19 0300 07/27/19 0329 07/28/19 0417 07/29/19 0531  WBC 13.1* 15.0* 18.5* 17.3* 15.1*  NEUTROABS  --   --  15.9* 15.1* 13.1*  HGB 7.9* 8.2* 9.1* 8.5* 8.3*  HCT 27.6* 27.9* 29.6* 28.8* 27.9*  MCV 104.2* 103.7* 101.7* 103.2* 101.8*  PLT 202 175 193 152 125*    Basic Metabolic Panel: Recent Labs  Lab 07/25/19 0543 07/26/19 0300 07/27/19 0329 07/28/19 0417 07/29/19 0531  NA 142 142 143 139 138  K 4.5 3.6 3.6 3.3* 3.6  CL 94* 91* 87* 84* 84*  CO2 45* 45* 48* 46* 47*  GLUCOSE 141* 100* 109* 95 112*  BUN 44* 40* 37* 34* 30*  CREATININE 0.44* 0.45* 0.37* 0.32* <0.30*  CALCIUM 8.4* 8.1* 8.1* 8.3* 8.1*  MG 2.0 1.7  --   --   --   PHOS 3.2 2.3*  --   --   --    GFR: CrCl cannot be calculated (This lab value cannot be used to calculate CrCl because it is not a number: <0.30). Recent Labs  Lab 07/26/19 0300 07/27/19 0329 07/28/19 0417 07/29/19 0531  WBC 15.0* 18.5* 17.3* 15.1*    Liver Function Tests: Recent Labs  Lab 07/28/19 0417 07/29/19 0531  AST 32 31  ALT 38 38  ALKPHOS 76 89  BILITOT 0.6 0.6  PROT 5.9* 5.6*  ALBUMIN 2.3* 2.1*   No results for input(s):  LIPASE, AMYLASE in the last 168 hours. No results for input(s): AMMONIA in the last 168 hours.  ABG    Component Value Date/Time   PHART 7.393 07/28/2019 2359   PCO2ART 87.5 (HH) 07/28/2019 2359   PO2ART 64.7 (L) 07/28/2019 2359   HCO3 52.1 (H) 07/28/2019 2359   TCO2 28 07/08/2019 1331   O2SAT 91.8 07/28/2019 2359     Coagulation Profile: No results for input(s): INR, PROTIME in the last 168 hours.  Cardiac Enzymes: No results for input(s): CKTOTAL, CKMB, CKMBINDEX, TROPONINI in the last 168 hours.  HbA1C: Hgb A1c MFr Bld  Date/Time Value Ref Range Status  07/09/2019 05:10 AM 7.3 (H) 4.8 - 5.6 % Final    Comment:    (NOTE) Pre diabetes:          5.7%-6.4% Diabetes:              >6.4% Glycemic control  for   <7.0% adults with diabetes   05/19/2019 03:56 AM 8.3 (H) 4.8 - 5.6 % Final    Comment:    (NOTE) Pre diabetes:          5.7%-6.4% Diabetes:              >6.4% Glycemic control for   <7.0% adults with diabetes     CBG: Recent Labs  Lab 07/28/19 1544 07/28/19 1949 07/28/19 2351 07/29/19 0403 07/29/19 0820  GLUCAP 194* 157* 118* 100* 144*     Critical care time: 87 minutes    Noe Gens, MSN, NP-C [facility] 07/29/2019, 9:41 AM   Please see [email or web address] for pager details.

## 2019-07-29 NOTE — Progress Notes (Signed)
eLink Physician-Brief Progress Note Patient Name: Steve [name] DOB: [birth year] MRN: [id number]   Date of Service  07/29/2019  HPI/Events of Note  Hypotension from propofol. Dilaudid gtt worked fine in recent past.   eICU Interventions  NS 500 ml DC propofol Dilaudid gtt ordered. Hold precedex also.       Intervention Category Intermediate Interventions: Hypotension - evaluation and management  Elmer Sow 07/29/2019, 1:21 AM

## 2019-07-29 NOTE — Progress Notes (Addendum)
CRITICAL VALUE ALERT  Critical Value:  PCO2 87.5  Date & Time Notied:  07/29/2019 0030  Provider Notified: E-Link  Orders Received/Actions taken: Continue current vent settings

## 2019-07-30 ENCOUNTER — Inpatient Hospital Stay (HOSPITAL_COMMUNITY): Payer: BC Managed Care – PPO

## 2019-07-30 DIAGNOSIS — U071 COVID-19: Secondary | ICD-10-CM

## 2019-07-30 DIAGNOSIS — J189 Pneumonia, unspecified organism: Secondary | ICD-10-CM | POA: Diagnosis not present

## 2019-07-30 DIAGNOSIS — J9601 Acute respiratory failure with hypoxia: Secondary | ICD-10-CM | POA: Diagnosis not present

## 2019-07-30 DIAGNOSIS — J80 Acute respiratory distress syndrome: Secondary | ICD-10-CM

## 2019-07-30 LAB — GLUCOSE, CAPILLARY
Glucose-Capillary: 76 mg/dL (ref 70–99)
Glucose-Capillary: 106 mg/dL — ABNORMAL HIGH (ref 70–99)
Glucose-Capillary: 183 mg/dL — ABNORMAL HIGH (ref 70–99)
Glucose-Capillary: 195 mg/dL — ABNORMAL HIGH (ref 70–99)
Glucose-Capillary: 172 mg/dL — ABNORMAL HIGH (ref 70–99)
Glucose-Capillary: 140 mg/dL — ABNORMAL HIGH (ref 70–99)

## 2019-07-30 LAB — COMPREHENSIVE METABOLIC PANEL
ALT: 34 U/L (ref 0–44)
AST: 25 U/L (ref 15–41)
Albumin: 2.4 g/dL — ABNORMAL LOW (ref 3.5–5.0)
Alkaline Phosphatase: 88 U/L (ref 38–126)
Anion gap: 8 (ref 5–15)
BUN: 34 mg/dL — ABNORMAL HIGH (ref 8–23)
CO2: 50 mmol/L — ABNORMAL HIGH (ref 22–32)
Calcium: 8.5 mg/dL — ABNORMAL LOW (ref 8.9–10.3)
Chloride: 86 mmol/L — ABNORMAL LOW (ref 98–111)
Creatinine, Ser: 0.35 mg/dL — ABNORMAL LOW (ref 0.61–1.24)
GFR calc Af Amer: >60 mL/min (ref >60)
GFR calc non Af Amer: >60 mL/min (ref >60)
Glucose, Bld: 151 mg/dL — ABNORMAL HIGH (ref 70–99)
Potassium: 4.4 mmol/L (ref 3.5–5.1)
Sodium: 144 mmol/L (ref 135–145)
Total Bilirubin: 0.6 mg/dL (ref 0.3–1.2)
Total Protein: 6.3 g/dL — ABNORMAL LOW (ref 6.5–8.1)

## 2019-07-30 LAB — CBC WITH DIFFERENTIAL/PLATELET
Abs Immature Granulocytes: 0.04 10*3/uL (ref 0.00–0.07)
Basophils Absolute: 0 10*3/uL (ref 0.0–0.1)
Basophils Relative: 0 %
Eosinophils Absolute: 0 10*3/uL (ref 0.0–0.5)
Eosinophils Relative: 0 %
HCT: 30 % — ABNORMAL LOW (ref 39.0–52.0)
Hemoglobin: 8.6 g/dL — ABNORMAL LOW (ref 13.0–17.0)
Immature Granulocytes: 0 %
Lymphocytes Relative: 8 %
Lymphs Abs: 0.9 10*3/uL (ref 0.7–4.0)
MCH: 30.3 pg (ref 26.0–34.0)
MCHC: 28.7 g/dL — ABNORMAL LOW (ref 30.0–36.0)
MCV: 105.6 fL — ABNORMAL HIGH (ref 80.0–100.0)
Monocytes Absolute: 0.9 10*3/uL (ref 0.1–1.0)
Monocytes Relative: 8 %
Neutro Abs: 9.5 10*3/uL — ABNORMAL HIGH (ref 1.7–7.7)
Neutrophils Relative %: 84 %
Platelets: 131 10*3/uL — ABNORMAL LOW (ref 150–400)
RBC: 2.84 MIL/uL — ABNORMAL LOW (ref 4.22–5.81)
RDW: 19.7 % — ABNORMAL HIGH (ref 11.5–15.5)
WBC: 11.4 10*3/uL — ABNORMAL HIGH (ref 4.0–10.5)
nRBC: 0 % (ref 0.0–0.2)

## 2019-07-30 LAB — BASIC METABOLIC PANEL
Anion gap: 8 (ref 5–15)
BUN: 31 mg/dL — ABNORMAL HIGH (ref 8–23)
CO2: 50 mmol/L — ABNORMAL HIGH (ref 22–32)
Calcium: 8.2 mg/dL — ABNORMAL LOW (ref 8.9–10.3)
Chloride: 83 mmol/L — ABNORMAL LOW (ref 98–111)
Creatinine, Ser: 0.33 mg/dL — ABNORMAL LOW (ref 0.61–1.24)
GFR calc Af Amer: >60 mL/min (ref >60)
GFR calc non Af Amer: >60 mL/min (ref >60)
Glucose, Bld: 83 mg/dL (ref 70–99)
Potassium: 3.4 mmol/L — ABNORMAL LOW (ref 3.5–5.1)
Sodium: 141 mmol/L (ref 135–145)

## 2019-07-30 LAB — CBC
HCT: 27.5 % — ABNORMAL LOW (ref 39.0–52.0)
Hemoglobin: 8.1 g/dL — ABNORMAL LOW (ref 13.0–17.0)
MCH: 31.2 pg (ref 26.0–34.0)
MCHC: 29.5 g/dL — ABNORMAL LOW (ref 30.0–36.0)
MCV: 105.8 fL — ABNORMAL HIGH (ref 80.0–100.0)
Platelets: 119 10*3/uL — ABNORMAL LOW (ref 150–400)
RBC: 2.6 MIL/uL — ABNORMAL LOW (ref 4.22–5.81)
RDW: 19.6 % — ABNORMAL HIGH (ref 11.5–15.5)
WBC: 8.8 10*3/uL (ref 4.0–10.5)
nRBC: 0 % (ref 0.0–0.2)

## 2019-07-30 LAB — PROTIME-INR
INR: 1 (ref 0.8–1.2)
Prothrombin Time: 13.5 seconds (ref 11.4–15.2)

## 2019-07-30 LAB — APTT: aPTT: 28 seconds (ref 24–36)

## 2019-07-30 MED ORDER — PROPOFOL 10 MG/ML IV BOLUS
500.0000 mg | Freq: Once | INTRAVENOUS | Status: DC
  Filled 2019-07-30: qty 50

## 2019-07-30 MED ORDER — PHENYLEPHRINE HCL-NACL 10-0.9 MG/250ML-% IV SOLN
INTRAVENOUS | Status: AC
  Administered 2019-07-30: 25 ug/min via INTRAVENOUS
  Filled 2019-07-30: qty 250

## 2019-07-30 MED ORDER — INSULIN GLARGINE 100 UNIT/ML ~~LOC~~ SOLN
6.0000 [IU] | Freq: Two times a day (BID) | SUBCUTANEOUS | Status: DC
  Administered 2019-07-30 – 2019-07-31 (×2): 6 [IU] via SUBCUTANEOUS
  Filled 2019-07-30 (×3): qty 0.06

## 2019-07-30 MED ORDER — VECURONIUM BROMIDE 10 MG IV SOLR
10.0000 mg | Freq: Once | INTRAVENOUS | Status: AC
  Administered 2019-07-30: 10 mg via INTRAVENOUS

## 2019-07-30 MED ORDER — ETOMIDATE 2 MG/ML IV SOLN
40.0000 mg | Freq: Once | INTRAVENOUS | Status: AC
  Filled 2019-07-30: qty 20

## 2019-07-30 MED ORDER — MIDAZOLAM HCL 2 MG/2ML IJ SOLN
5.0000 mg | Freq: Once | INTRAMUSCULAR | Status: DC
  Filled 2019-07-30: qty 6

## 2019-07-30 MED ORDER — POTASSIUM CHLORIDE 20 MEQ/15ML (10%) PO SOLN
40.0000 meq | Freq: Once | ORAL | Status: AC
  Administered 2019-07-30: 40 meq
  Filled 2019-07-30: qty 30

## 2019-07-30 MED ORDER — PHENYLEPHRINE 40 MCG/ML (10ML) SYRINGE FOR IV PUSH (FOR BLOOD PRESSURE SUPPORT): 80.0000 ug | PREFILLED_SYRINGE | Freq: Once | INTRAVENOUS | Status: AC | PRN

## 2019-07-30 MED ORDER — SULFAMETHOXAZOLE-TRIMETHOPRIM 200-40 MG/5ML PO SUSP
20.0000 mL | Freq: Every day | ORAL | Status: DC
  Administered 2019-07-31 – 2019-08-28 (×27): 20 mL
  Filled 2019-07-30 (×33): qty 20

## 2019-07-30 MED ORDER — ETOMIDATE 2 MG/ML IV SOLN
20.0000 mg | Freq: Once | INTRAVENOUS | Status: AC
  Administered 2019-07-30: 20 mg via INTRAVENOUS

## 2019-07-30 MED ORDER — PHENYLEPHRINE HCL-NACL 10-0.9 MG/250ML-% IV SOLN: 25.0000 ug/min | INTRAVENOUS | Status: DC

## 2019-07-30 MED ORDER — PHENYLEPHRINE 40 MCG/ML (10ML) SYRINGE FOR IV PUSH (FOR BLOOD PRESSURE SUPPORT)
PREFILLED_SYRINGE | INTRAVENOUS | Status: AC
  Administered 2019-07-30: 400 ug via INTRAVENOUS
  Filled 2019-07-30: qty 10

## 2019-07-30 MED ORDER — DEXTROSE 10 % IV SOLN: INTRAVENOUS | Status: DC

## 2019-07-30 MED ORDER — FENTANYL CITRATE (PF) 100 MCG/2ML IJ SOLN
200.0000 ug | Freq: Once | INTRAMUSCULAR | Status: DC
  Filled 2019-07-30: qty 4

## 2019-07-30 MED ORDER — VITAL AF 1.2 CAL PO LIQD
1000.0000 mL | ORAL | Status: DC
  Administered 2019-07-30 – 2019-08-06 (×10): 1000 mL

## 2019-07-30 MED ORDER — SODIUM CHLORIDE 0.9 % IV SOLN
250.0000 mL | INTRAVENOUS | Status: DC
  Administered 2019-08-01 – 2019-08-04 (×3): 250 mL via INTRAVENOUS

## 2019-07-30 MED ORDER — PRO-STAT SUGAR FREE PO LIQD
30.0000 mL | Freq: Every day | ORAL | Status: DC
  Administered 2019-07-31 – 2019-08-05 (×6): 30 mL
  Filled 2019-07-30 (×6): qty 30

## 2019-07-30 MED ORDER — VECURONIUM BROMIDE 10 MG IV SOLR
10.0000 mg | Freq: Once | INTRAVENOUS | Status: AC
  Filled 2019-07-30: qty 10

## 2019-07-30 NOTE — Progress Notes (Addendum)
Nutrition Follow-up  DOCUMENTATION CODES:   Non-severe (moderate) malnutrition in context of chronic illness  INTERVENTION:  - recommend small bore NGT placement following trach today. - once TF restarted: Vital AF 1.2 @ 55 ml/hr with 30 ml prostat once/day. - this regimen will provide 1684 kcal (99% re-estimated kcal need), 114 grams protein, and 1070 ml free water. - free water flush, if desired, to be per MD/NP.    NUTRITION DIAGNOSIS:   Moderate Malnutrition related to chronic illness, cancer and cancer related treatments as evidenced by mild fat depletion, mild muscle depletion, percent weight loss. -ongoing  GOAL:   Patient will meet greater than or equal to 90% of their needs -met with TF regimen  MONITOR:   Vent status, TF tolerance, Labs, Weight trends, Skin  ASSESSMENT:   64 year-old male with a history of Hodgkin's lymphoma (stage 3, diagnosed 02/2019) on chemotherapy. He follows with Dr. Maylon Peppers in Oncology. His most recent treatment was on 3/8. He presented to the ED with worsening respiratory status and inability to tolerate PO intake recently. He had his chemo delayed this week. He has a history of neutropenic fever in the past. He had a good response to chemo on his first follow up PET scan. His appetite has been poor and he has been receiving IV fluids.  Patient remains intubated with OGT in place. TF off; RN reports turned off ~1 hour ago in preparation for tracheostomy early afternoon. Estimated nutrition needs based on weight this AM (69.9 kg) which is down from weight 4/10-4/19. Mild edema noted to all extremities.  Per notes: - ARDS - spontaneous pneumothoraces - acute metabolic encephalopathy--improving - anemia of chronic illness - Hodgkin's lymphoma - protein calorie malnutrition - physical deconditioning - ongoing [place] discussions with patient's wife   Patient is currently intubated on ventilator support MV: 9.9 L/min Temp (24hrs), Avg:98.3 F (36.8  C), Min:97.3 F (36.3 C), Max:99.1 F (37.3 C) Propofol: none BP: 143/85 and MAP: 102   Labs reviewed; CBGs: 76 and 106 mg/dl, K: 3.4 mmol/l, Cl: 83 mmol/l, BUN: 31 mg/dl, creatinine: 0.33 mg/dl, Ca: 8.2 mg/dl. Medications reviewed; 100 mg colace BID, 300 mg ferrous sulfate/day, 40 mg IV lasix BID, 2 units novolog every 4 hours, sliding scale novolog, 6 units lantus BID, 60 mg solu-medrol/day, 15 ml multivitamin/day, 1 packet miralax/day. Drip; precedex @ 0.4 mcg/kg/hr.    Diet Order:   Diet Order            Diet NPO time specified  Diet effective now              EDUCATION NEEDS:   No education needs have been identified at this time  Skin:  Skin Assessment: Skin Integrity Issues: Skin Integrity Issues:: DTI, Stage I, Unstageable DTI: sacrum (4/15) Stage I: sacrum (4/13); R heel (4/19) Unstageable: full thickness to lip (4/15)  Last BM:  4/19  Height:   Ht Readings from Last 1 Encounters:  07/18/19 5' 6"  (1.676 m)    Weight:   Wt Readings from Last 1 Encounters:  07/30/19 69.9 kg    Ideal Body Weight:  64.5 kg  BMI:  Body mass index is 24.87 kg/m.  Estimated Nutritional Needs:   Kcal:  1707 kcal  Protein:  110-120 grams  Fluid:  >/= 1.8 L/day     Jarome Matin, MS, RD, LDN, CNSC Inpatient Clinical Dietitian RD pager # available in AMION  After hours/weekend pager # available in [facility]

## 2019-07-30 NOTE — Progress Notes (Addendum)
eLink Physician-Brief Progress Note Patient Name: Steve [name] DOB: Apr 13, 1955 MRN: [id number]   Date of Service  07/30/2019  HPI/Events of Note  Called by Radiologist d/t large amount of free intraperitoneal air around the liver in setting of bilateral pneumothorax and bilateral chest tubes. Also has [place] air in chest wall. Discussed with PCCM ground coverage who will order a Abdominal CT scan and evaluate the patient at bedside. Patient is on Lantus + Novolog SSI.  eICU Interventions  Will order: 1. Will hold enteral nutrition. 2. D10W IV infusion to run IV at 30 mL/hour.      Intervention Category Major Interventions: Other:  Shahil Speegle Cornelia Copa 07/30/2019, 8:00 PM

## 2019-07-30 NOTE — Progress Notes (Signed)
Came to see patient for free intraperitoneal air by abd xray for feeding tube placement.  Patient's VS are stable, no fever or unusual tachycardia.  Patient's neurologic status unchanged, awake, not interactive.  There does not appear to be any pain to deep palpation of the abd in 4 quadrants and bowel sounds are present, perhaps hyperactive. Still producing liquid stool via rectal tube.    Ct of ABD shows air tracking down around the peritoneum from pneumomediastinum.  Any intraabdominal free air seems most likely from there based on exam and CT of ABD.  Will send CBC and CMP and hold tube feeds tonight.  Over 35 minutes spent in bedside evaluation and critical care planning.

## 2019-07-30 NOTE — Progress Notes (Signed)
   axr reviewed   Ok to use panda     SIGNATURE    Dr. Brand Males, M.D., F.C.C.P,  Pulmonary and Critical Care Medicine Staff Physician, [place] Director - Interstitial Lung Disease  Program  Pulmonary [place] at [place], [place], [address]  Pager: [phone], If no answer or between  15:00h - 7:00h: call [phone] Telephone: [phone]  6:29 PM 07/30/2019

## 2019-07-30 NOTE — Progress Notes (Addendum)
NAME:  TEMITAYO COVALT, MRN:  [id number], DOB:  26-Sep-1955, LOS: 73 ADMISSION DATE:  07/08/2019, CONSULTATION DATE:  3/29 REFERRING MD:  [name], CHIEF COMPLAINT:  dyspnea   Brief History   64 y/o male, with prior COVID infection 05/2019, admitted with severe acute respiratory failure with hypoxemia in the setting of receiving chemotherapy for Hodgkin's lymphoma.     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 ARDS. Bilateral PTX s/p CTx2 4/19 Remains on vent 60%/PEEP 8, likely to need trach   Consults:  ID >> s/o 4/09 Oncology >> s/o 4/05  Procedures:    Significant Diagnostic Tests:   CTA chest 3/29 > no PE, diffuse GGO upper lobes, patchy consolidation in bases, peripheral fibrotic changes noted on prior CT chest still present  ECHO 3/30 > LVEF 60 to 31%, grade 1 diastolic dysfunction. Mildly dilated LA, normal RV. Trivial MR and TR,otherwise normal valves  Micro Data:  UC 3/29 >> negative  BCx2 3/29 >> negative  PJP 4/2 >> negative  Fungitel 3/29 >> positive Blastomyces Antigen 3/20 >> negative Respiratory Culture 4/4 >> staph epidermis >> S-vanco, clinda BAL 4/8 >> candida glabrata Fungal (BAL) 4/8 >> Candida glabrata PJP (BAL) 4/8 >> negative  Aspergillus antigen (BAL) 4/8 >> Negative  Antimicrobials:  Cefepime 3/29 >>3/31 Vanc 3/29 >>3/30 Azithromycin 3/29 >> 4/1 Fluconazole 3/29 >> 4/13 Meropenem 3/31 >> 4/6 Bactrim 3/31 >> 4/4  Clindamycin 4/5 >> 4/20 Primaquine 4/5 >> 4/20 Anidulafungin 4/12  >>  Interim history/subjective:  PEEP 8, FiO2 50% Tmax 98.8 / WBC 8.8 UOP 2.6L, -1.3L in 24h Wife at bedside.    Objective   Blood pressure 118/78, pulse 83,  temperature 98.8 F (37.1 C), resp. rate (!) 26, height _0  (1.676 m), weight 69.9 kg, SpO2 96 %.    Vent Mode: PRVC FiO2 (%):  [50 %-60 %] 50 % Set Rate:  [26 bmp] 26 bmp Vt Set:  [350 mL-380 mL] 380 mL PEEP:  [8 cmH20] 8 cmH20 Plateau Pressure:  [22 cmH20-27 cmH20] 27 cmH20   Intake/Output Summary (Last 24 hours) at 07/30/2019 1113 Last data filed at 07/30/2019 0700 Gross per 24 hour  Intake 1737.33 ml  Output 2775 ml  Net -1037.67 ml   Filed Weights   07/28/19 0500 07/29/19 0500 07/30/19 0318  Weight: 72.6 kg 73 kg 69.9 kg    Examination: General: chronically ill appearing adult male lying in bed in NAD on vent HEENT: MM pink/dry, coating on tongue, ETT, mild facial swelling, SQ crepitance  Neuro: awakens, intermittently alert, nods yes/no, generalized weakness  CV: s1s2 RRR, no m/r/g PULM:  Mild accessory muscle use on vent, basilar crackles, bilateral chest tubes with 1/7 air leak on right GI: soft, bsx4 active  Extremities: warm/dry, trace to 1+ generalized edema  Skin: no rashes or lesions  Resolved Hospital Problem list     Assessment & Plan:   ARDS of Uncertain Etiology Suspect multifactorial in setting of post COVID, potential pulmonary toxicity with Brentuximab (carries ~ 5% risk), treated with steroids.  Suspect this will take weeks for his lungs to recover, unclear if he physically will be able to recover from ICU illness -PRVC 8cc/kg as rest mode  -wean PEEP / fiO2 for sats >  88% -wife consented for trach 4/20 -follow plateau / driving pressures  -intermittent CXR -continue solumedrol  -to complete abx 4/20 for PJP coverage  C glabrata in BAL -continue anidulafungin, 14 days planned given immunocompromised state   Spontaneous Pneumothoraces Pneumomediastinum -follow intermittent CXR  -CT's to suction  -CT care per protocol   Acute metabolic encephalopathy > improved 4/18 Need for sedation for mechanical ventilation Fentanyl  tachyphylaxis -continue precedex, dilaudid gtt -minimize sedation as able  -RASS Goal 0 to -1  -hopeful to be able to quickly reduce sedation once trach in place as vent mechanics allow  Anemia of chronic illness Hodgkin's lymphoma -trend CBC -transfuse for Hgb <7%  Hyperlipidemia -continue statin   Severe protein calorie malnutrition -TF   Physical deconditioning -PT / OT efforts as able, passive ROM  -chair position   GOC Extensive discussion (approximately 20 minutes) with wife [name] today at bedside.  We reviewed his positive cancer prognosis and his unfortunate prolonged critical illness due to COVID and respiratory failure.  She understands that the tracheostomy is not a curative intervention but will hopefully allow time for his lungs to heal.  We discussed the nature of prolonged critical illness and they even with the trach he may succumb to downstream effects of prolonged illness.  We reviewed the concept of CPR and recommendation that we continue to care for [place] up to the point of performing CPR.  She is going to consider and we will revisit concept.  She is very hopeful that having the tube out of his mouth will add to his comfort, reduce sedation and allow him to be more interactive.  Will proceed with trach and continue ongoing plan of care discussions.   Best practice:  Diet: TF Pain/Anxiety/Delirium protocol (if indicated): yes VAP protocol (if indicated): yes DVT prophylaxis: lovenox GI prophylaxis: PPI Glucose control: SSI Mobility: bed rest Code Status: full Family Communication: Wife updated 4/20 at bedside.  Disposition: ICU  Labs   CBC: Recent Labs  Lab 07/26/19 0300 07/27/19 0329 07/28/19 0417 07/29/19 0531 07/30/19 0356  WBC 15.0* 18.5* 17.3* 15.1* 8.8  NEUTROABS  --  15.9* 15.1* 13.1*  --   HGB 8.2* 9.1* 8.5* 8.3* 8.1*  HCT 27.9* 29.6* 28.8* 27.9* 27.5*  MCV 103.7* 101.7* 103.2* 101.8* 105.8*  PLT 175 193 152 125* 119*    Basic  Metabolic Panel: Recent Labs  Lab 07/25/19 0543 07/25/19 0543 07/26/19 0300 07/27/19 0329 07/28/19 0417 07/29/19 0531 07/30/19 0356  NA 142   < > 142 143 139 138 141  K 4.5   < > 3.6 3.6 3.3* 3.6 3.4*  CL 94*   < > 91* 87* 84* 84* 83*  CO2 45*   < > 45* 48* 46* 47* 50*  GLUCOSE 141*   < > 100* 109* 95 112* 83  BUN 44*   < > 40* 37* 34* 30* 31*  CREATININE 0.44*   < > 0.45* 0.37* 0.32* <0.30* 0.33*  CALCIUM 8.4*   < > 8.1* 8.1* 8.3* 8.1* 8.2*  MG 2.0  --  1.7  --   --   --   --   PHOS 3.2  --  2.3*  --   --   --   --    < > = values in this interval not displayed.   GFR: Estimated Creatinine Clearance: 85.3 mL/min (A) (by C-G formula based on SCr of 0.33 mg/dL (L)). Recent Labs  Lab 07/27/19 0329 07/28/19 0417 07/29/19 0531 07/30/19 0356  WBC 18.5* 17.3* 15.1* 8.8    Liver Function Tests: Recent Labs  Lab 07/28/19 0417 07/29/19 0531  AST 32 31  ALT 38 38  ALKPHOS 76 89  BILITOT 0.6 0.6  PROT 5.9* 5.6*  ALBUMIN 2.3* 2.1*   No results for input(s): LIPASE, AMYLASE in the last 168 hours. No results for input(s): AMMONIA in the last 168 hours.  ABG    Component Value Date/Time   PHART 7.393 07/28/2019 2359   PCO2ART 87.5 (HH) 07/28/2019 2359   PO2ART 64.7 (L) 07/28/2019 2359   HCO3 52.1 (H) 07/28/2019 2359   TCO2 28 07/08/2019 1331   O2SAT 91.8 07/28/2019 2359     Coagulation Profile: No results for input(s): INR, PROTIME in the last 168 hours.  Cardiac Enzymes: No results for input(s): CKTOTAL, CKMB, CKMBINDEX, TROPONINI in the last 168 hours.  HbA1C: Hgb A1c MFr Bld  Date/Time Value Ref Range Status  07/09/2019 05:10 AM 7.3 (H) 4.8 - 5.6 % Final    Comment:    (NOTE) Pre diabetes:          5.7%-6.4% Diabetes:              >6.4% Glycemic control for   <7.0% adults with diabetes   05/19/2019 03:56 AM 8.3 (H) 4.8 - 5.6 % Final    Comment:    (NOTE) Pre diabetes:          5.7%-6.4% Diabetes:              >6.4% Glycemic control for    <7.0% adults with diabetes     CBG: Recent Labs  Lab 07/29/19 1547 07/29/19 1948 07/29/19 2323 07/30/19 0322 07/30/19 0834  GLUCAP 181* 172* 161* 76 106*     Critical care time:40 minutes    Noe Gens, MSN, NP-C [facility] 07/30/2019, 11:13 AM   Please see [email or web address] for pager details.

## 2019-07-30 NOTE — Procedures (Signed)
Bronchoscopy Procedure Note Steve [name] [id number] [birth year]  Procedure: Bronchoscopy Indications: Direct visualization for placement of tracheostomy  Procedure Details Consent: Risks of procedure as well as the alternatives and risks of each were explained to the (patient/caregiver).  Consent for procedure obtained. Time Out: Verified patient identification, verified procedure, site/side was marked, verified correct patient position, special equipment/implants available, medications/allergies/relevent history reviewed, required imaging and test results available.  Performed  In preparation for procedure, patient was given 100% FiO2 and bronchoscope lubricated. Sedation: Dilaudid, Etomidate, Precedex, Vecuronium.   Airway entered and the following bronchi were examined to the level of the carina.  Procedures performed: Tracheostomy placement Bronchoscope removed.    Evaluation Hemodynamic Status: Hypotension / soft normal BP prior to procedure treated with pressors in anticipation of further hypotension with sedation, O2 sats: stable throughout Patient's Current Condition: stable Specimens:  None Complications: No apparent complications Patient tolerated the procedure well.   Procedure performed under direct supervision of Dr. Chase Caller with video bronchoscopy for direct visualization.    Steve Gens, MSN, NP-C [facility] 07/30/2019, 2:52 PM   Please see [email or web address] for pager details.

## 2019-07-30 NOTE — Progress Notes (Addendum)
RN could not get Panda tube - was getting into airway and hitting on new trach Called to sort out - . Wife informed subsequent to procedure. She was appreciative  Patient sedated on vent Additional dilaudid and versed given  Glidescope used to give visual with mac 3 blade but RN could not still pass ng tube . Was entering cord Glidescope changed to MAc 4 -> initialy no luck but later with some lift of the airway NG tube passed easily posterior to cords and secured around 55-60cm by RN. Gas sounds ausculrated.   Plan  get axr    SIGNATURE    Dr. Brand Males, M.D., F.C.C.P,  Pulmonary and Critical Care Medicine Staff Physician, [place] Director - Interstitial Lung Disease  Program  Pulmonary [facility] at [place], [place], [address]  Pager: [phone], If no answer or between  15:00h - 7:00h: call [phone] Telephone: [phone]  4:59 PM 07/30/2019

## 2019-07-30 NOTE — Progress Notes (Signed)
   07/30/19 1200  Clinical Encounter Type  Visited With Patient  Visit Type Initial;Psychological support;Spiritual support  Referral From Nurse;Family  Consult/Referral To Chaplain  Spiritual Encounters  Spiritual Needs Prayer;Emotional;Other (Comment) (Spiritual Care Conversation/Support/Comfort)  Stress Factors  Patient Stress Factors Other (Comment);Health changes   I visited with Steve [name] per a request from his wife. [name] was not able to speak to me due to being intubated, but was able to nod his head to answer questions. I gave him a prayer shawl for comfort and he acknowledged that he would like prayer, so I prayed with him at the bedside.   Please, contact Spiritual Care for further assistance.   Chaplain Shanon Ace M.Div., [facility]

## 2019-07-30 NOTE — Procedures (Signed)
Procedure: Percutaneous Tracheostomy CPT 31600 Performed by: Salvadore Dom Bronchoscopy Assistant: Noe Gens NP-C .  Indications: Chronic respiratory failure and need for ongoing mechanical ventilation.  Consent: Given patient's intubation and sedation, the patient was unable to provide consent. Discussed the procedure with the patient's decision maker, including the indications, risks, benefits, and alternatives. All questions were answered. Verbal witnessed consent was obtained and placed in the chart.  Preprocedure: Universal protocol was followed for this procedure. Prior to the initiation of sedation or the procedure, a timeout/"Pause for the Cause" was performed. The patient's identity was verified by confirming the patient's wrist band for name, date of birth, and medical record number. Everyone in the room was in agreement with the patient identify, the procedure to be performed, consent was in place and matched the planned procedure, and the procedure site. The area was cleaned with a CHG scrub and draped with large sterile barrier. Hand hygiene was performed, and cap, mask, sterile gown, and sterile gloves were worn. The patient was covered by a large sterile drape. Sterile technique was maintained for the entire procedure.  Anesthesia: The patient was intubated and sedated prior to the procedure. Additional midazolam, etomidate, fentanyl and rocuronium were given for sedation and paralysis with close attention to vital signs throughout procedure. Please refer to the accompanying procedural sedation form for additional details. Once the patient was adequately sedated and with continuous BIS monitoring, vecuronium was administered for paralysis.  Procedure: The patient was placed in the supine position. The anterior neck was prepped and draped in usual sterile fashion. 1% lidocaine was administered approximately 2 fingerbreadths above the sternal notch for local anesthesia. A 1.5-cm vertical  incision was then performed 2 fingerbreadths above the sternal notch. Using a curved Kelly, blunt dissection was performed down to the level of the pretracheal fascia. At this point, the bronchoscope was introduced through the endotracheal tube and the trachea was properly visualized. The endotracheal tube was then gradually withdrawn within the trachea under direct bronchoscopic visualization. Proper midline position was confirmed by bouncing the needle from the tracheostomy tray over the trachea with bronchoscopic examination. The needle was advanced into the trachea and proper positioning was confirmed with direct visualization. The needle was then removed leaving a white outer cannula in position. The wire from the tracheostomy tray was then advanced through the white outer cannula. The cannula was then removed. The small, blue dilator was then advanced over the wire into the trachea. Once proper dilatation was achieved, the dilator was removed. The large, tapered dilator was then advanced over the wire into the trachea. The dilator was removed leaving the wire and white inner cannula in position. A number 6 percutaneous Shiley tracheostomy tube was then advanced over the wire and white inner cannula into the trachea. Proper positioning was confirmed with bronchoscopic visualization. The tracheostomy tube was then sutured in place with four nylon sutures. It was further secured with a tracheostomy tie.  Estimated blood loss: Less than 5 mL.  Complications: None immediate.  CXR ordered.  Erick Colace ACNP-BC [place] Pager # [phone] OR # [phone] if no answer

## 2019-07-30 NOTE — Progress Notes (Signed)
Pt transported from ICU to CT and back on vent at 100% fio2.  Pt tolerated transport well without incident.

## 2019-07-31 ENCOUNTER — Inpatient Hospital Stay (HOSPITAL_COMMUNITY): Payer: BC Managed Care – PPO

## 2019-07-31 DIAGNOSIS — J189 Pneumonia, unspecified organism: Secondary | ICD-10-CM | POA: Diagnosis not present

## 2019-07-31 LAB — CBC
HCT: 29.1 % — ABNORMAL LOW (ref 39.0–52.0)
Hemoglobin: 8.4 g/dL — ABNORMAL LOW (ref 13.0–17.0)
MCH: 30.9 pg (ref 26.0–34.0)
MCHC: 28.9 g/dL — ABNORMAL LOW (ref 30.0–36.0)
MCV: 107 fL — ABNORMAL HIGH (ref 80.0–100.0)
Platelets: 121 10*3/uL — ABNORMAL LOW (ref 150–400)
RBC: 2.72 MIL/uL — ABNORMAL LOW (ref 4.22–5.81)
RDW: 19.8 % — ABNORMAL HIGH (ref 11.5–15.5)
WBC: 8.9 10*3/uL (ref 4.0–10.5)
nRBC: 0 % (ref 0.0–0.2)

## 2019-07-31 LAB — BASIC METABOLIC PANEL
BUN: 33 mg/dL — ABNORMAL HIGH (ref 8–23)
CO2: >50 mmol/L — ABNORMAL HIGH (ref 22–32)
Calcium: 8.6 mg/dL — ABNORMAL LOW (ref 8.9–10.3)
Chloride: 85 mmol/L — ABNORMAL LOW (ref 98–111)
Creatinine, Ser: 0.35 mg/dL — ABNORMAL LOW (ref 0.61–1.24)
GFR calc Af Amer: >60 mL/min (ref >60)
GFR calc non Af Amer: >60 mL/min (ref >60)
Glucose, Bld: 143 mg/dL — ABNORMAL HIGH (ref 70–99)
Potassium: 3.7 mmol/L (ref 3.5–5.1)
Sodium: 143 mmol/L (ref 135–145)

## 2019-07-31 LAB — GLUCOSE, CAPILLARY
Glucose-Capillary: 111 mg/dL — ABNORMAL HIGH (ref 70–99)
Glucose-Capillary: 120 mg/dL — ABNORMAL HIGH (ref 70–99)
Glucose-Capillary: 135 mg/dL — ABNORMAL HIGH (ref 70–99)
Glucose-Capillary: 154 mg/dL — ABNORMAL HIGH (ref 70–99)
Glucose-Capillary: 165 mg/dL — ABNORMAL HIGH (ref 70–99)
Glucose-Capillary: 206 mg/dL — ABNORMAL HIGH (ref 70–99)
Glucose-Capillary: 223 mg/dL — ABNORMAL HIGH (ref 70–99)
Glucose-Capillary: 69 mg/dL — ABNORMAL LOW (ref 70–99)

## 2019-07-31 LAB — PHOSPHORUS: Phosphorus: 2.7 mg/dL (ref 2.5–4.6)

## 2019-07-31 LAB — MAGNESIUM: Magnesium: 2 mg/dL (ref 1.7–2.4)

## 2019-07-31 MED ORDER — SERTRALINE HCL 50 MG PO TABS
50.0000 mg | ORAL_TABLET | Freq: Every day | ORAL | Status: DC
  Administered 2019-07-31 – 2019-08-21 (×21): 50 mg via ORAL
  Filled 2019-07-31 (×21): qty 1

## 2019-07-31 MED ORDER — INSULIN GLARGINE 100 UNIT/ML ~~LOC~~ SOLN
6.0000 [IU] | Freq: Every day | SUBCUTANEOUS | Status: DC
  Administered 2019-08-01 – 2019-08-28 (×27): 6 [IU] via SUBCUTANEOUS
  Filled 2019-07-31 (×28): qty 0.06

## 2019-07-31 MED ORDER — DEXTROSE 50 % IV SOLN
INTRAVENOUS | Status: AC
  Administered 2019-07-31: 25 mL
  Filled 2019-07-31: qty 50

## 2019-07-31 NOTE — Progress Notes (Signed)
CRITICAL VALUE ALERT  Critical Value:  CBG 69  Date & Time Notied: 07/31/338  Provider Notified: e-link  Orders Received/Actions taken: waiting for new orders, 37ml D50 given will recheck

## 2019-07-31 NOTE — Evaluation (Signed)
SLP Cancellation Note  Patient Details Name: Steve [name] MRN: [id number] DOB: October 02, 1955   Cancelled treatment:       Reason Eval/Treat Not Completed: Other (comment);Medical issues which prohibited therapy(Pt remains on vent, per RT is starting to wean; RN advised pt is not appropriate to be seen today; will follow from afar for readiness for swallow eval and PMSV.  Thanks for the orders.)   [name] 07/31/2019, 8:19 AM   Kathleen Lime, MS [place] Office [phone]

## 2019-07-31 NOTE — Progress Notes (Signed)
PT Cancellation Note  Patient Details Name: Steve [name] MRN: [id number] DOB: March 27, 1956   Cancelled Treatment:    Reason Eval/Treat Not Completed: Medical issues which prohibited therapy, patient lethargic, RR somewhat high, PEEP 15, just doesn't appear ready for  PT today.   Claretha Cooper 07/31/2019, 3:51 PM [name] PT [facility] Pager [phone] Office [phone]

## 2019-07-31 NOTE — Progress Notes (Signed)
eLink Physician-Brief Progress Note Patient Name: Steve [name] DOB: 1956/04/09 MRN: [id number]   Date of Service  07/31/2019  HPI/Events of Note  Hypoglycemia - Blood glucose = 69.  eICU Interventions  Will increase D10W IV infusion rate to 50 mL/hour.      Intervention Category Major Interventions: Other:  [name],[name] 07/31/2019, 4:00 AM

## 2019-07-31 NOTE — Progress Notes (Signed)
NAME:  Steve [name], MRN:  [id number], DOB:  1955-08-06, LOS: 74 ADMISSION DATE:  07/08/2019, CONSULTATION DATE:  3/29 REFERRING MD:  [name], CHIEF COMPLAINT:  dyspnea   Brief History   64 y/o male, with prior COVID infection 05/2019, admitted with severe acute respiratory failure with hypoxemia in the setting of receiving chemotherapy for Hodgkin's lymphoma.    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 ARDS. Bilateral PTX s/p CTx2 4/19 Remains on vent 60%/PEEP 8, likely to need trach   Consults:  ID >> s/o 4/09 Oncology >> s/o 4/05  Procedures:    Significant Diagnostic Tests:   CTA chest 3/29 > no PE, diffuse GGO upper lobes, patchy consolidation in bases, peripheral fibrotic changes noted on prior CT chest still present  ECHO 3/30 > LVEF 60 to 24%, grade 1 diastolic dysfunction. Mildly dilated LA, normal RV. Trivial MR and TR,otherwise normal valves  CT Intraperitoneal air, likely extension of the pneumomediastinum/pneumothorax. An intra-abdominal etiology such as bowel perforation is favored less likely.Extensive pneumomediastinum and partially visualized right pneumothorax. Diarrheal state. Correlation with clinical exam and stool cultures recommended. No bowel obstruction. Normal appendix. Sigmoid diverticulosis  Micro Data:  UC 3/29 >> negative  BCx2 3/29 >> negative  PJP 4/2 >> negative  Fungitel 3/29 >> positive Blastomyces Antigen 3/20 >> negative Respiratory Culture 4/4 >> staph epidermis >> S-vanco, clinda BAL 4/8 >> candida glabrata Fungal (BAL) 4/8 >> Candida glabrata PJP (BAL) 4/8 >> negative  Aspergillus antigen (BAL) 4/8 >> Negative  Antimicrobials:  Cefepime  3/29 >>3/31 Vanc 3/29 >>3/30 Azithromycin 3/29 >> 4/1 Fluconazole 3/29 >> 4/13 Meropenem 3/31 >> 4/6 Bactrim 3/31 >> 4/4  Clindamycin 4/5 >> 4/20 Primaquine 4/5 >> 4/20 Anidulafungin 4/12  >>  Interim history/subjective:  RN reports no acute concerns overnight, vital signs stable. CT results as above no clinical signs of bowel perforation overnight or this AM.  Objective   Blood pressure 140/86, pulse 95, temperature 98.8 F (37.1 C), temperature source Bladder, resp. rate (!) 26, height _0  (1.676 m), weight 63.2 kg, SpO2 96 %.    Vent Mode: PSV;CPAP FiO2 (%):  [50 %] 50 % Set Rate:  [26 bmp] 26 bmp Vt Set:  [380 mL] 380 mL PEEP:  [8 cmH20] 8 cmH20 Pressure Support:  [15 cmH20] 15 cmH20 Plateau Pressure:  [27 cmH20-29 cmH20] 27 cmH20   Intake/Output Summary (Last 24 hours) at 07/31/2019 0856 Last data filed at 07/31/2019 0758 Gross per 24 hour  Intake 1278.6 ml  Output 2500 ml  Net -1221.4 ml   Filed Weights   07/29/19 0500 07/30/19 0318 07/31/19 0500  Weight: 73 kg 69.9 kg 63.2 kg    Examination: General: Chronically ill appearing elderly male on mechanical ventilation through trach, in NAD HEENT: Trach midline, 6 shiley cuffed trach,  MM pink/moist, PERRL, neck and facial swelling, mild SQ crepitance  Neuro: Will open eyes to verbal stimuli, can follow simple commands,  CV: s1s2 regular rate and rhythm, no murmur, rubs, or gallops,  PULM:  Bilateral chest tubes with small airleak on right, clear breath sounds bilaterally, no increased work of breathing,  GI: soft, bowel sounds hyperactive in all 4 quadrants, non-tender, non-distended Extremities: warm/dry, no edema  Skin: no rashes  or lesions  Resolved Hospital Problem list     Assessment & Plan:   ARDS of Uncertain Etiology -Suspect multifactorial in setting of post COVID, potential pulmonary toxicity with Brentuximab (carries ~ 5% risk), treated with steroids.  Suspect this will take weeks for his lungs  to recover, unclear if he physically will be able to recover from ICU illness P: Continue ventilator support through trach with lung protective strategies  Begin attempts at SBT  Wean PEEP and FiO2 for sats greater than 90%. Head of bed elevated 30 degrees. Plateau pressures less than 30 cm H20.  Follow intermittent chest x-ray and ABG.   Ensure adequate pulmonary hygiene  VAP bundle in place  PAD protocol  C glabrata in BAL P: Continue Anidulafungin for 14 day course   Spontaneous Pneumothoraces Pneumomediastinum P: Follow intermitten CXR Continue CT to suction  Routine CT care   Intraperitoneal free air -Seen on ABD x-ray that was obtained to verify placement of NG tube. CT ABD obtain which confirmed intraperitoneal air, likely  Due to extension of the pneumomediastinum/pneumothorax. P: No acute clinical signs of perforated bowel  Slowly advance TF  Closely monitor in the ICU setting  Acute metabolic encephalopathy > improved 4/18 Need for sedation for mechanical ventilation Fentanyl tachyphylaxis P: Continue Precedex and dilaudid  Minimize sedation as able RASS goal 0 to -1 Hopeful for sedation wean today now that trach is in place   Anemia of chronic illness Hodgkin's lymphoma P: Trend CBC  Transfuse for HGB <7  Hyperlipidemia P: Continue statin   Severe protein calorie malnutrition P: Continue TF and protein supplementation   Physical deconditioning P: PT/OT when able   GOC Extensive discussion (approximately 20 minutes) with wife [name] today at bedside.  We reviewed his positive cancer prognosis and his unfortunate prolonged critical illness due to COVID and respiratory failure.  She understands that the tracheostomy is not a curative intervention but will hopefully allow time for his lungs to heal.  We discussed the nature of prolonged critical illness and they even with the trach he may succumb to downstream effects of prolonged illness.  We  reviewed the concept of CPR and recommendation that we continue to care for Steve [name] up to the point of performing CPR.  She is going to consider and we will revisit concept.  She is very hopeful that having the tube out of his mouth will add to his comfort, reduce sedation and allow him to be more interactive.  Will proceed with trach and continue ongoing plan of care discussions.   Best practice:  Diet: TF Pain/Anxiety/Delirium protocol (if indicated): yes VAP protocol (if indicated): yes DVT prophylaxis: lovenox GI prophylaxis: PPI Glucose control: SSI Mobility: bed rest Code Status: full Family Communication: Wife updated 4/20 at bedside.  Disposition: ICU  Labs   CBC: Recent Labs  Lab 07/27/19 0329 07/27/19 0329 07/28/19 0417 07/29/19 0531 07/30/19 0356 07/30/19 2106 07/31/19 0415  WBC 18.5*   < > 17.3* 15.1* 8.8 11.4* 8.9  NEUTROABS 15.9*  --  15.1* 13.1*  --  9.5*  --   HGB 9.1*   < > 8.5* 8.3* 8.1* 8.6* 8.4*  HCT 29.6*   < > 28.8* 27.9* 27.5* 30.0* 29.1*  MCV 101.7*   < > 103.2* 101.8* 105.8* 105.6* 107.0*  PLT 193   < > 152 125* 119* 131* 121*   < > = values in this interval not displayed.    Basic Metabolic Panel: Recent Labs  Lab 07/25/19 0543  07/25/19 0543 07/26/19 0300 07/27/19 0329 07/28/19 0417 07/29/19 0531 07/30/19 0356 07/30/19 2106 07/31/19 0415  NA 142   < > 142   < > 139 138 141 144 143  K 4.5   < > 3.6   < > 3.3* 3.6 3.4* 4.4 3.7  CL 94*   < > 91*   < > 84* 84* 83* 86* 85*  CO2 45*   < > 45*   < > 46* 47* 50* 50* >50*  GLUCOSE 141*   < > 100*   < > 95 112* 83 151* 143*  BUN 44*   < > 40*   < > 34* 30* 31* 34* 33*  CREATININE 0.44*   < > 0.45*   < > 0.32* <0.30* 0.33* 0.35* 0.35*  CALCIUM 8.4*   < > 8.1*   < > 8.3* 8.1* 8.2* 8.5* 8.6*  MG 2.0  --  1.7  --   --   --   --   --  2.0  PHOS 3.2  --  2.3*  --   --   --   --   --  2.7   < > = values in this interval not displayed.   GFR: Estimated Creatinine Clearance: 84.5 mL/min (A) (by C-G  formula based on SCr of 0.35 mg/dL (L)). Recent Labs  Lab 07/29/19 0531 07/30/19 0356 07/30/19 2106 07/31/19 0415  WBC 15.1* 8.8 11.4* 8.9    Liver Function Tests: Recent Labs  Lab 07/28/19 0417 07/29/19 0531 07/30/19 2106  AST 32 31 25  ALT 38 38 34  ALKPHOS 76 89 88  BILITOT 0.6 0.6 0.6  PROT 5.9* 5.6* 6.3*  ALBUMIN 2.3* 2.1* 2.4*   No results for input(s): LIPASE, AMYLASE in the last 168 hours. No results for input(s): AMMONIA in the last 168 hours.  ABG    Component Value Date/Time   PHART 7.393 07/28/2019 2359   PCO2ART 87.5 (HH) 07/28/2019 2359   PO2ART 64.7 (L) 07/28/2019 2359   HCO3 52.1 (H) 07/28/2019 2359   TCO2 28 07/08/2019 1331   O2SAT 91.8 07/28/2019 2359     Coagulation Profile: Recent Labs  Lab 07/30/19 1337  INR 1.0    Cardiac Enzymes: No results for input(s): CKTOTAL, CKMB, CKMBINDEX, TROPONINI in the last 168 hours.  HbA1C: Hgb A1c MFr Bld  Date/Time Value Ref Range Status  07/09/2019 05:10 AM 7.3 (H) 4.8 - 5.6 % Final    Comment:    (NOTE) Pre diabetes:          5.7%-6.4% Diabetes:              >6.4% Glycemic control for   <7.0% adults with diabetes   05/19/2019 03:56 AM 8.3 (H) 4.8 - 5.6 % Final    Comment:    (NOTE) Pre diabetes:          5.7%-6.4% Diabetes:              >6.4% Glycemic control for   <7.0% adults with diabetes     CBG: Recent Labs  Lab 07/30/19 1947 07/30/19 2314 07/31/19 0332 07/31/19 0508 07/31/19 0744  GLUCAP 172* 140* 69* 120* 111*     Critical care time:    Performed by: Johnsie Cancel  Total critical care time: 40 minutes  Critical care time was exclusive of separately billable procedures and treating other patients.  Critical care was necessary to treat or prevent imminent or life-threatening deterioration.  Critical care was time spent  personally by me on the following activities: development of treatment plan with patient and/or surrogate as well as nursing, discussions with  consultants, evaluation of patient's response to treatment, examination of patient, obtaining history from patient or surrogate, ordering and performing treatments and interventions, ordering and review of laboratory studies, ordering and review of radiographic studies, pulse oximetry and re-evaluation of patient's condition.  Johnsie Cancel, NP-C [facility] Contact / Pager information can be found on Amion  07/31/2019, 9:09 AM

## 2019-08-01 ENCOUNTER — Inpatient Hospital Stay (HOSPITAL_COMMUNITY): Payer: BC Managed Care – PPO

## 2019-08-01 DIAGNOSIS — J80 Acute respiratory distress syndrome: Secondary | ICD-10-CM | POA: Diagnosis not present

## 2019-08-01 DIAGNOSIS — J189 Pneumonia, unspecified organism: Secondary | ICD-10-CM | POA: Diagnosis not present

## 2019-08-01 DIAGNOSIS — J9601 Acute respiratory failure with hypoxia: Secondary | ICD-10-CM | POA: Diagnosis not present

## 2019-08-01 DIAGNOSIS — C8100 Nodular lymphocyte predominant Hodgkin lymphoma, unspecified site: Secondary | ICD-10-CM | POA: Diagnosis not present

## 2019-08-01 DIAGNOSIS — J9312 Secondary spontaneous pneumothorax: Secondary | ICD-10-CM

## 2019-08-01 DIAGNOSIS — Z930 Tracheostomy status: Secondary | ICD-10-CM

## 2019-08-01 DIAGNOSIS — J982 Interstitial emphysema: Secondary | ICD-10-CM

## 2019-08-01 LAB — BASIC METABOLIC PANEL
Anion gap: 10 (ref 5–15)
BUN: 37 mg/dL — ABNORMAL HIGH (ref 8–23)
CO2: 50 mmol/L — ABNORMAL HIGH (ref 22–32)
Calcium: 8.5 mg/dL — ABNORMAL LOW (ref 8.9–10.3)
Chloride: 84 mmol/L — ABNORMAL LOW (ref 98–111)
Creatinine, Ser: 0.42 mg/dL — ABNORMAL LOW (ref 0.61–1.24)
GFR calc Af Amer: >60 mL/min (ref >60)
GFR calc non Af Amer: >60 mL/min (ref >60)
Glucose, Bld: 145 mg/dL — ABNORMAL HIGH (ref 70–99)
Potassium: 3.7 mmol/L (ref 3.5–5.1)
Sodium: 144 mmol/L (ref 135–145)

## 2019-08-01 LAB — GLUCOSE, CAPILLARY
Glucose-Capillary: 127 mg/dL — ABNORMAL HIGH (ref 70–99)
Glucose-Capillary: 130 mg/dL — ABNORMAL HIGH (ref 70–99)
Glucose-Capillary: 152 mg/dL — ABNORMAL HIGH (ref 70–99)
Glucose-Capillary: 155 mg/dL — ABNORMAL HIGH (ref 70–99)
Glucose-Capillary: 163 mg/dL — ABNORMAL HIGH (ref 70–99)
Glucose-Capillary: 163 mg/dL — ABNORMAL HIGH (ref 70–99)

## 2019-08-01 LAB — MAGNESIUM: Magnesium: 2 mg/dL (ref 1.7–2.4)

## 2019-08-01 LAB — PHOSPHORUS: Phosphorus: 3.1 mg/dL (ref 2.5–4.6)

## 2019-08-01 NOTE — Progress Notes (Signed)
Confirmed with CCM NP that pt could transport with chest tubes to water seal

## 2019-08-01 NOTE — Progress Notes (Signed)
NAME:  Steve [name], MRN:  [id number], DOB:  16-Aug-1955, LOS: 24 ADMISSION DATE:  07/08/2019, CONSULTATION DATE:  3/29 REFERRING MD:  [name], CHIEF COMPLAINT:  dyspnea   Brief History   64 y/o male, with prior COVID infection 05/2019, admitted with severe acute respiratory failure with hypoxemia in the setting of receiving chemotherapy for Hodgkin's lymphoma.    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 ARDS. Bilateral PTX s/p CTx2 4/19 Remains on vent 60%/PEEP 8, likely to need trach   Consults:  ID >> s/o 4/09 Oncology >> s/o 4/05  Procedures:    Significant Diagnostic Tests:   CTA chest 3/29 > no PE, diffuse GGO upper lobes, patchy consolidation in bases, peripheral fibrotic changes noted on prior CT chest still present  ECHO 3/30 > LVEF 60 to 93%, grade 1 diastolic dysfunction. Mildly dilated LA, normal RV. Trivial MR and TR,otherwise normal valves  CT Intraperitoneal air, likely extension of the pneumomediastinum/pneumothorax. An intra-abdominal etiology such as bowel perforation is favored less likely.Extensive pneumomediastinum and partially visualized right pneumothorax. Diarrheal state. Correlation with clinical exam and stool cultures recommended. No bowel obstruction. Normal appendix. Sigmoid diverticulosis  Micro Data:  UC 3/29 >> negative  BCx2 3/29 >> negative  PJP 4/2 >> negative  Fungitel 3/29 >> positive Blastomyces Antigen 3/20 >> negative Respiratory Culture 4/4 >> staph epidermis >> S-vanco, clinda BAL 4/8 >> candida glabrata Fungal (BAL) 4/8 >> Candida glabrata PJP (BAL) 4/8 >> negative  Aspergillus antigen (BAL) 4/8 >> Negative  Antimicrobials:  Cefepime  3/29 >>3/31 Vanc 3/29 >>3/30 Azithromycin 3/29 >> 4/1 Fluconazole 3/29 >> 4/13 Meropenem 3/31 >> 4/6 Bactrim 3/31 >> 4/4  Clindamycin 4/5 >> 4/20 Primaquine 4/5 >> 4/20 Anidulafungin 4/12  >>4/20  Interim history/subjective:  No acute events overnight. Chest tubes functioning, have not been getting stripped overnight.  Objective   Blood pressure (!) 146/73, pulse 97, temperature 99.1 F (37.3 C), resp. rate 15, height _0  (1.676 m), weight 65.4 kg, SpO2 91 %.    Vent Mode: PSV;CPAP FiO2 (%):  [40 %-50 %] 45 % Set Rate:  [26 bmp] 26 bmp Vt Set:  [380 mL] 380 mL PEEP:  [8 cmH20] 8 cmH20 Pressure Support:  [15 cmH20] 15 cmH20 Plateau Pressure:  [24 cmH20-27 cmH20] 24 cmH20   Intake/Output Summary (Last 24 hours) at 08/01/2019 0940 Last data filed at 08/01/2019 0533 Gross per 24 hour  Intake 1225.43 ml  Output 2240 ml  Net -1014.57 ml   Filed Weights   07/30/19 0318 07/31/19 0500 08/01/19 0403  Weight: 69.9 kg 63.2 kg 65.4 kg    Examination: General: ill-appearing elderly man laying in bed sedated, on MV HEENT: [place] emphysema extending from neck into cheeks and periorbital area. Eyes anicteric. Wounds on tongue and lips- no erythema or bleeding. Neuro: RASS -5  CV: RRR, no murmurs  PULM:  rhales bilaterally, intermittent air leak in bilateral chest tubes, no obvious clots in either tube. GI: soft, NT, ND Extremities: minimal dependent edema. Skin: no rashes. No erythema around port.  Resolved Hospital Problem list     Assessment & Plan:   ARDS of uncertain etiology, complicated by significant hypercapnia. -Suspect multifactorial in setting of post COVID, potential pulmonary toxicity with Brentuximab (  carries ~ 5% risk), treated with steroids.  Suspect this will take weeks for his lungs to recover. It remains unclear if he physically will be able to recover from his critical illness. P: -Continue vent support.  On minimal weaning trial today with pressure support  15.  Full vent support. -Titrate PEEP and FiO2 to maintain saturations greater than 80%.  Limit mean airway pressure as much as possible. -VAP prevention protocol  Candida glabrata in BAL P: -Completed 14-day course of anidulafungin  Spontaneous Pneumothoraces Pneumomediastinum [place] emphysema, peritoneal free air P: -CXR -Continue bilateral chest tubes to suction  Intraperitoneal free air -Seen on ABD x-ray that was obtained to verify placement of NG tube. CT ABD obtain which confirmed intraperitoneal air, likely  Due to extension of the pneumomediastinum/ pneumothorax. P: -Continue to monitor abdominal exam clinically -Continue tube feeds at goal  Acute metabolic encephalopathy > improved 4/18 Need for sedation for mechanical ventilation Fentanyl tachyphylaxis At risk for encephalopathy due to hypercarbia P: -Continue Precedex and Dilaudid infusions; goal RASS 0 to -1.  Minimize sedation as possible  Anemia of chronic illness Hodgkin's lymphoma P: -Transfuse for hemoglobin less than 7  Hyperlipidemia P: -Continue statin  Severe protein calorie malnutrition P: -Continue tube feeds  Physical deconditioning P: -PT & OT when able to participate  Benzie I updated Ms. Syverson at bedside about his ongoing care. She understands that I remain concerned about his ability to recover from significant lung injury and prolonged critical illness. I think he is likely to have a prolonged time frame where he will be unable to receive treatment for his lymphoma, and this may affect his prognosis negatively. She wants to continue our current curative efforts.   Best practice:  Diet: TF Pain/Anxiety/Delirium protocol (if indicated): yes VAP protocol (if indicated): yes DVT prophylaxis: lovenox GI prophylaxis: PPI Glucose control: SSI Mobility: bed rest Code Status: full Family Communication: Wife updated 4/20 at bedside.  Disposition: ICU  Labs   CBC: Recent Labs  Lab  07/27/19 0329 07/27/19 0329 07/28/19 0417 07/29/19 0531 07/30/19 0356 07/30/19 2106 07/31/19 0415  WBC 18.5*   < > 17.3* 15.1* 8.8 11.4* 8.9  NEUTROABS 15.9*  --  15.1* 13.1*  --  9.5*  --   HGB 9.1*   < > 8.5* 8.3* 8.1* 8.6* 8.4*  HCT 29.6*   < > 28.8* 27.9* 27.5* 30.0* 29.1*  MCV 101.7*   < > 103.2* 101.8* 105.8* 105.6* 107.0*  PLT 193   < > 152 125* 119* 131* 121*   < > = values in this interval not displayed.    Basic Metabolic Panel: Recent Labs  Lab 07/26/19 0300 07/27/19 0329 07/29/19 0531 07/30/19 0356 07/30/19 2106 07/31/19 0415 08/01/19 0401  NA 142   < > 138 141 144 143 144  K 3.6   < > 3.6 3.4* 4.4 3.7 3.7  CL 91*   < > 84* 83* 86* 85* 84*  CO2 45*   < > 47* 50* 50* >50* 50*  GLUCOSE 100*   < > 112* 83 151* 143* 145*  BUN 40*   < > 30* 31* 34* 33* 37*  CREATININE 0.45*   < > <0.30* 0.33* 0.35* 0.35* 0.42*  CALCIUM 8.1*   < > 8.1* 8.2* 8.5* 8.6* 8.5*  MG 1.7  --   --   --   --  2.0 2.0  PHOS 2.3*  --   --   --   --  2.7 3.1   < > =  values in this interval not displayed.   GFR: Estimated Creatinine Clearance: 85.3 mL/min (A) (by C-G formula based on SCr of 0.42 mg/dL (L)). Recent Labs  Lab 07/29/19 0531 07/30/19 0356 07/30/19 2106 07/31/19 0415  WBC 15.1* 8.8 11.4* 8.9    Liver Function Tests: Recent Labs  Lab 07/28/19 0417 07/29/19 0531 07/30/19 2106  AST 32 31 25  ALT 38 38 34  ALKPHOS 76 89 88  BILITOT 0.6 0.6 0.6  PROT 5.9* 5.6* 6.3*  ALBUMIN 2.3* 2.1* 2.4*   No results for input(s): LIPASE, AMYLASE in the last 168 hours. No results for input(s): AMMONIA in the last 168 hours.  ABG    Component Value Date/Time   PHART 7.393 07/28/2019 2359   PCO2ART 87.5 (HH) 07/28/2019 2359   PO2ART 64.7 (L) 07/28/2019 2359   HCO3 52.1 (H) 07/28/2019 2359   TCO2 28 07/08/2019 1331   O2SAT 91.8 07/28/2019 2359     Coagulation Profile: Recent Labs  Lab 07/30/19 1337  INR 1.0    Cardiac Enzymes: No results for input(s): CKTOTAL, CKMB,  CKMBINDEX, TROPONINI in the last 168 hours.  HbA1C: Hgb A1c MFr Bld  Date/Time Value Ref Range Status  07/09/2019 05:10 AM 7.3 (H) 4.8 - 5.6 % Final    Comment:    (NOTE) Pre diabetes:          5.7%-6.4% Diabetes:              >6.4% Glycemic control for   <7.0% adults with diabetes   05/19/2019 03:56 AM 8.3 (H) 4.8 - 5.6 % Final    Comment:    (NOTE) Pre diabetes:          5.7%-6.4% Diabetes:              >6.4% Glycemic control for   <7.0% adults with diabetes     CBG: Recent Labs  Lab 07/31/19 1609 07/31/19 2027 07/31/19 2337 08/01/19 0350 08/01/19 0749  GLUCAP 165* 154* 135* 130* 152*     This patient is critically ill with multiple organ system failure which requires frequent high complexity decision making, assessment, support, evaluation, and titration of therapies. This was completed through the application of advanced monitoring technologies and extensive interpretation of multiple databases. During this encounter critical care time was devoted to patient care services described in this note for 45 minutes.

## 2019-08-01 NOTE — Progress Notes (Signed)
PT Cancellation Note  Patient Details Name: Steve [name] MRN: [id number] DOB: Mar 31, 1956   Cancelled Treatment:    Reason Eval/Treat Not Completed: Medical issues which prohibited therapy, per RN, not arousing, for CT.  Will follow along  For PT readiness.    Claretha Cooper 08/01/2019, 3:29 PM  [name] PT Acute Rehabilitation Services Pager [phone] Office [phone]

## 2019-08-02 DIAGNOSIS — J189 Pneumonia, unspecified organism: Secondary | ICD-10-CM | POA: Diagnosis not present

## 2019-08-02 DIAGNOSIS — J9601 Acute respiratory failure with hypoxia: Secondary | ICD-10-CM | POA: Diagnosis not present

## 2019-08-02 DIAGNOSIS — J80 Acute respiratory distress syndrome: Secondary | ICD-10-CM | POA: Diagnosis not present

## 2019-08-02 LAB — GLUCOSE, CAPILLARY
Glucose-Capillary: 132 mg/dL — ABNORMAL HIGH (ref 70–99)
Glucose-Capillary: 134 mg/dL — ABNORMAL HIGH (ref 70–99)
Glucose-Capillary: 140 mg/dL — ABNORMAL HIGH (ref 70–99)
Glucose-Capillary: 148 mg/dL — ABNORMAL HIGH (ref 70–99)
Glucose-Capillary: 154 mg/dL — ABNORMAL HIGH (ref 70–99)
Glucose-Capillary: 96 mg/dL (ref 70–99)

## 2019-08-02 LAB — BASIC METABOLIC PANEL
BUN: 35 mg/dL — ABNORMAL HIGH (ref 8–23)
CO2: >50 mmol/L — ABNORMAL HIGH (ref 22–32)
Calcium: 8.5 mg/dL — ABNORMAL LOW (ref 8.9–10.3)
Chloride: 85 mmol/L — ABNORMAL LOW (ref 98–111)
Creatinine, Ser: 0.39 mg/dL — ABNORMAL LOW (ref 0.61–1.24)
GFR calc Af Amer: >60 mL/min (ref >60)
GFR calc non Af Amer: >60 mL/min (ref >60)
Glucose, Bld: 109 mg/dL — ABNORMAL HIGH (ref 70–99)
Potassium: 3.7 mmol/L (ref 3.5–5.1)
Sodium: 147 mmol/L — ABNORMAL HIGH (ref 135–145)

## 2019-08-02 LAB — MAGNESIUM: Magnesium: 2 mg/dL (ref 1.7–2.4)

## 2019-08-02 LAB — PHOSPHORUS: Phosphorus: 2.2 mg/dL — ABNORMAL LOW (ref 2.5–4.6)

## 2019-08-02 MED ORDER — DOCUSATE SODIUM 50 MG/5ML PO LIQD: 100.0000 mg | Freq: Two times a day (BID) | ORAL | Status: DC

## 2019-08-02 MED ORDER — POLYETHYLENE GLYCOL 3350 17 G PO PACK: 17.0000 g | PACK | Freq: Every day | ORAL | Status: DC

## 2019-08-02 MED ORDER — HYDROMORPHONE HCL 1 MG/ML IJ SOLN
0.5000 mg | INTRAMUSCULAR | Status: DC | PRN
  Administered 2019-08-02 – 2019-08-19 (×46): 0.5 mg via INTRAVENOUS
  Filled 2019-08-02 (×47): qty 1

## 2019-08-02 MED ORDER — METOPROLOL TARTRATE 5 MG/5ML IV SOLN
2.5000 mg | INTRAVENOUS | Status: AC
  Administered 2019-08-02: 2.5 mg via INTRAVENOUS

## 2019-08-02 MED ORDER — METOPROLOL TARTRATE 5 MG/5ML IV SOLN
INTRAVENOUS | Status: AC
  Filled 2019-08-02: qty 5

## 2019-08-02 MED ORDER — DOCUSATE SODIUM 50 MG/5ML PO LIQD: 100.0000 mg | Freq: Every day | ORAL | Status: DC | PRN

## 2019-08-02 NOTE — Progress Notes (Signed)
PT Cancellation Note  Patient Details Name: Steve [name] MRN: [id number] DOB: 05/02/1955   Cancelled Treatment:     PT deferred this date. Pt returned to vent.  Will follow.[place] Pager [phone] Office [phone]    [facility] 08/02/2019, 2:38 PM

## 2019-08-02 NOTE — Progress Notes (Addendum)
NAME:  Steve [name], MRN:  [id number], DOB:  [birth year], LOS: 55 ADMISSION DATE:  07/08/2019, CONSULTATION DATE:  3/29 REFERRING MD:  [name], CHIEF COMPLAINT:  dyspnea   Brief History   64 y/o male, with prior COVID infection 05/2019, admitted with severe acute respiratory failure with hypoxemia in the setting of receiving chemotherapy for Hodgkin's lymphoma.  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 ARDS. Bilateral PTX s/p CTx2 4/19 Remains on vent 60%/PEEP 8, likely to need trach   Consults:  ID >> s/o 4/09 Oncology >> s/o 4/05  Procedures:  4/8 ETT > 4/20 4/20 Trach >  4/9 bilateral chest tubes  Significant Diagnostic Tests:   CTA chest 3/29 > no PE, diffuse GGO upper lobes, patchy consolidation in bases, peripheral fibrotic changes noted on prior CT chest still present  ECHO 3/30 >LVEF 60 to 30%, grade 1 diastolic dysfunction. Mildly dilated LA, normal RV. Trivial MR and TR,otherwise normal valves  CT Intraperitoneal air, likely extension of the pneumomediastinum/pneumothorax. An intra-abdominal etiology such as bowel perforation is favored less likely.Extensive pneumomediastinum and partially visualized right pneumothorax. Diarrheal state. Correlation with clinical exam and stool cultures recommended. No bowel obstruction. Normal appendix. Sigmoid diverticulosis  CT head 4/22 > NAICP, sub cutaneous air in neck, bilateral mastoid effusions  Micro Data:  UC 3/29 >>negative  BCx2 3/29 >>negative  PJP 4/2 >>negative  Fungitel 3/29 >> positive Blastomyces Antigen 3/20 >> negative Respiratory Culture 4/4 >> staph epidermis >> S-vanco, clinda BAL 4/8 >>candida glabrata Fungal  (BAL) 4/8 >> Candida glabrata PJP (BAL) 4/8 >>negative  Aspergillus antigen (BAL) 4/8 >> Negative  Antimicrobials:  Cefepime 3/29 >>3/31 Vanc 3/29 >>3/30 Azithromycin 3/29 >> 4/1 Fluconazole 3/29 >> 4/13 Meropenem 3/31 >> 4/6 Bactrim 3/31 >> 4/4  Clindamycin 4/5 >>4/20 Primaquine 4/5 >>4/20 Anidulafungin 4/12  >>4/20  Interim history/subjective:  Weaning on pressure support 15/5 > went into SVT Facial swelling better  Objective   Blood pressure 105/70, pulse 96, temperature 99.7 F (37.6 C), resp. rate (!) 28, height _0  (1.676 m), weight 65.4 kg, SpO2 97 %.    Vent Mode: PRVC FiO2 (%):  [40 %-50 %] 50 % Set Rate:  [26 bmp] 26 bmp Vt Set:  [380 mL] 380 mL PEEP:  [8 cmH20] 8 cmH20 Pressure Support:  [15 cmH20] 15 cmH20 Plateau Pressure:  [23 cmH20-30 cmH20] 23 cmH20   Intake/Output Summary (Last 24 hours) at 08/02/2019 0805 Last data filed at 08/02/2019 0600 Gross per 24 hour  Intake 525.22 ml  Output 3005 ml  Net -2479.78 ml   Filed Weights   07/30/19 0318 07/31/19 0500 08/01/19 0403  Weight: 69.9 kg 63.2 kg 65.4 kg    Examination: General:  In bed on vent HENT: NCAT Trach in place  PULM: CTA B, vent supported breathing CV: RRR, no mgr GI: BS+, soft, nontender MSK: normal bulk and tone Neuro: sedated on vent   Resolved Hospital Problem list   Candida glabrata in BAL PJP pneumonia, suspected, treated for 21 days  Assessment & Plan:  ARDS, see previous discussions about potential etiology Central apnea?  Continue full mechanical ventilatory support Pressure support trials daily as tolerated Tracheostomy care per routine  Spontaneous pneumothoraces Pneumomediastinum Sub cutaneous emphysema, peritoneal free  air Continue chest tubes to suction Daily chest x-ray Monitor drain output, have instructed nurses on how to strip drain if necessary  Acute metabolic encephalopathy > improved 4/18> 4/23, starting to follow commands Need for sedation for  mechanical ventilation Fentanyl tachyphylaxis At risk for encephalopathy due to hypercarbia Discontinue Dilaudid infusion Use very low-dose narcotic only as needed for pain Use Tylenol as needed for pain Change RASS target to 0 Frequent orientation  Anemia of chronic illness Hodgkin lymphoma Monitor for bleeding Transfuse PRBC for Hgb < 7 gm/dL  Hyperlipidemia statin  Severe protein calorie malnutrition Tube feeding  Physical deconditioning PT consult  Goals of care Lengthy conversation with his wife.  I told her that this process will take weeks and even with our best efforts he will suffer during that time.  She understands and is willing to proceed.  She understands we feel his prognosis is uncertain. Specifically I told her that his lungs may take longer to recover than the rest of his body (with lymphoma) can last.  Appreciate palliative care involvement.    Best practice:  Diet: tube feeding Pain/Anxiety/Delirium protocol (if indicated): yes, as above VAP protocol (if indicated): yes DVT prophylaxis: lovenox GI prophylaxis: PPI Glucose control: SSI Mobility: bed rest Code Status: full Family Communication: wife updated 4/23 bedside Disposition: remain in ICU  Labs   CBC: Recent Labs  Lab 07/27/19 0329 07/27/19 0329 07/28/19 0417 07/29/19 0531 07/30/19 0356 07/30/19 2106 07/31/19 0415  WBC 18.5*   < > 17.3* 15.1* 8.8 11.4* 8.9  NEUTROABS 15.9*  --  15.1* 13.1*  --  9.5*  --   HGB 9.1*   < > 8.5* 8.3* 8.1* 8.6* 8.4*  HCT 29.6*   < > 28.8* 27.9* 27.5* 30.0* 29.1*  MCV 101.7*   < > 103.2* 101.8* 105.8* 105.6* 107.0*  PLT 193   < > 152 125* 119* 131* 121*   < > = values in this interval not displayed.    Basic Metabolic Panel: Recent Labs  Lab 07/30/19 0356 07/30/19 2106 07/31/19 0415 08/01/19 0401 08/02/19 0326  NA 141 144 143 144 147*  K 3.4* 4.4 3.7 3.7 3.7  CL 83* 86* 85* 84* 85*  CO2 50* 50* >50* 50* >50*  GLUCOSE 83 151* 143* 145* 109*    BUN 31* 34* 33* 37* 35*  CREATININE 0.33* 0.35* 0.35* 0.42* 0.39*  CALCIUM 8.2* 8.5* 8.6* 8.5* 8.5*  MG  --   --  2.0 2.0 2.0  PHOS  --   --  2.7 3.1 2.2*   GFR: Estimated Creatinine Clearance: 85.3 mL/min (A) (by C-G formula based on SCr of 0.39 mg/dL (L)). Recent Labs  Lab 07/29/19 0531 07/30/19 0356 07/30/19 2106 07/31/19 0415  WBC 15.1* 8.8 11.4* 8.9    Liver Function Tests: Recent Labs  Lab 07/28/19 0417 07/29/19 0531 07/30/19 2106  AST 32 31 25  ALT 38 38 34  ALKPHOS 76 89 88  BILITOT 0.6 0.6 0.6  PROT 5.9* 5.6* 6.3*  ALBUMIN 2.3* 2.1* 2.4*   No results for input(s): LIPASE, AMYLASE in the last 168 hours. No results for input(s): AMMONIA in the last 168 hours.  ABG    Component Value Date/Time   PHART 7.393 07/28/2019 2359   PCO2ART 87.5 (HH) 07/28/2019 2359   PO2ART 64.7 (L) 07/28/2019 2359   HCO3 52.1 (H) 07/28/2019 2359   TCO2 28 07/08/2019 1331   O2SAT 91.8 07/28/2019 2359     Coagulation Profile: Recent Labs  Lab  07/30/19 1337  INR 1.0    Cardiac Enzymes: No results for input(s): CKTOTAL, CKMB, CKMBINDEX, TROPONINI in the last 168 hours.  HbA1C: Hgb A1c MFr Bld  Date/Time Value Ref Range Status  07/09/2019 05:10 AM 7.3 (H) 4.8 - 5.6 % Final    Comment:    (NOTE) Pre diabetes:          5.7%-6.4% Diabetes:              >6.4% Glycemic control for   <7.0% adults with diabetes   05/19/2019 03:56 AM 8.3 (H) 4.8 - 5.6 % Final    Comment:    (NOTE) Pre diabetes:          5.7%-6.4% Diabetes:              >6.4% Glycemic control for   <7.0% adults with diabetes     CBG: Recent Labs  Lab 08/01/19 1153 08/01/19 1617 08/01/19 1949 08/01/19 2353 08/02/19 0339  GLUCAP 127* 163* 163* 155* 96     Critical care time: 35 minutes     [name], MD [place] [facility] Pager: [phone] Cell: [phone] If no response, call [phone]

## 2019-08-02 NOTE — Progress Notes (Addendum)
SLP Cancellation Note  Patient Details Name: Steve [name] MRN: [id number] DOB: 01-Nov-1955   Cancelled treatment:       Reason Eval/Treat Not Completed: Other (comment);Medical issues which prohibited therapy(RN advised SLP hold on treatment due to respiratory issues, pt returned to vent per RN, will continue efforts)  Kathleen Lime, MS [place] Office [phone]  [name] 08/02/2019, 11:17 AM

## 2019-08-02 NOTE — Progress Notes (Signed)
Dilaudid gtt was taken down and wasted by two RN's (myself and Lucretia Field, RN). 70 ml of Dilaudid was put in the Stericycle.

## 2019-08-02 NOTE — Progress Notes (Signed)
Flexi-seal replaced due to ballon not inflating. Patient cleaned and new flexi-seal put in place.

## 2019-08-03 ENCOUNTER — Inpatient Hospital Stay (HOSPITAL_COMMUNITY): Payer: BC Managed Care – PPO

## 2019-08-03 DIAGNOSIS — Z7189 Other specified counseling: Secondary | ICD-10-CM | POA: Diagnosis not present

## 2019-08-03 DIAGNOSIS — J189 Pneumonia, unspecified organism: Secondary | ICD-10-CM | POA: Diagnosis not present

## 2019-08-03 DIAGNOSIS — C8100 Nodular lymphocyte predominant Hodgkin lymphoma, unspecified site: Secondary | ICD-10-CM | POA: Diagnosis not present

## 2019-08-03 DIAGNOSIS — Z515 Encounter for palliative care: Secondary | ICD-10-CM | POA: Diagnosis not present

## 2019-08-03 DIAGNOSIS — J80 Acute respiratory distress syndrome: Secondary | ICD-10-CM | POA: Diagnosis not present

## 2019-08-03 LAB — GLUCOSE, CAPILLARY
Glucose-Capillary: 120 mg/dL — ABNORMAL HIGH (ref 70–99)
Glucose-Capillary: 124 mg/dL — ABNORMAL HIGH (ref 70–99)
Glucose-Capillary: 148 mg/dL — ABNORMAL HIGH (ref 70–99)
Glucose-Capillary: 160 mg/dL — ABNORMAL HIGH (ref 70–99)
Glucose-Capillary: 168 mg/dL — ABNORMAL HIGH (ref 70–99)
Glucose-Capillary: 174 mg/dL — ABNORMAL HIGH (ref 70–99)
Glucose-Capillary: 174 mg/dL — ABNORMAL HIGH (ref 70–99)

## 2019-08-03 LAB — CBC WITH DIFFERENTIAL/PLATELET
Abs Immature Granulocytes: 0.03 10*3/uL (ref 0.00–0.07)
Basophils Absolute: 0 10*3/uL (ref 0.0–0.1)
Basophils Relative: 0 %
Eosinophils Absolute: 0.5 10*3/uL (ref 0.0–0.5)
Eosinophils Relative: 5 %
HCT: 30 % — ABNORMAL LOW (ref 39.0–52.0)
Hemoglobin: 8.5 g/dL — ABNORMAL LOW (ref 13.0–17.0)
Immature Granulocytes: 0 %
Lymphocytes Relative: 14 %
Lymphs Abs: 1.5 10*3/uL (ref 0.7–4.0)
MCH: 30.8 pg (ref 26.0–34.0)
MCHC: 28.3 g/dL — ABNORMAL LOW (ref 30.0–36.0)
MCV: 108.7 fL — ABNORMAL HIGH (ref 80.0–100.0)
Monocytes Absolute: 0.6 10*3/uL (ref 0.1–1.0)
Monocytes Relative: 6 %
Neutro Abs: 7.6 10*3/uL (ref 1.7–7.7)
Neutrophils Relative %: 75 %
Platelets: 121 10*3/uL — ABNORMAL LOW (ref 150–400)
RBC: 2.76 MIL/uL — ABNORMAL LOW (ref 4.22–5.81)
RDW: 19 % — ABNORMAL HIGH (ref 11.5–15.5)
WBC: 10.2 10*3/uL (ref 4.0–10.5)
nRBC: 0 % (ref 0.0–0.2)

## 2019-08-03 LAB — COMPREHENSIVE METABOLIC PANEL
ALT: 40 U/L (ref 0–44)
AST: 33 U/L (ref 15–41)
Albumin: 2.5 g/dL — ABNORMAL LOW (ref 3.5–5.0)
Alkaline Phosphatase: 105 U/L (ref 38–126)
BUN: 44 mg/dL — ABNORMAL HIGH (ref 8–23)
CO2: >50 mmol/L — ABNORMAL HIGH (ref 22–32)
Calcium: 8.6 mg/dL — ABNORMAL LOW (ref 8.9–10.3)
Chloride: 84 mmol/L — ABNORMAL LOW (ref 98–111)
Creatinine, Ser: 0.51 mg/dL — ABNORMAL LOW (ref 0.61–1.24)
GFR calc Af Amer: >60 mL/min (ref >60)
GFR calc non Af Amer: >60 mL/min (ref >60)
Glucose, Bld: 188 mg/dL — ABNORMAL HIGH (ref 70–99)
Potassium: 3.5 mmol/L (ref 3.5–5.1)
Sodium: 148 mmol/L — ABNORMAL HIGH (ref 135–145)
Total Bilirubin: 0.6 mg/dL (ref 0.3–1.2)
Total Protein: 6.4 g/dL — ABNORMAL LOW (ref 6.5–8.1)

## 2019-08-03 LAB — MAGNESIUM: Magnesium: 2.1 mg/dL (ref 1.7–2.4)

## 2019-08-03 LAB — PHOSPHORUS: Phosphorus: 3.2 mg/dL (ref 2.5–4.6)

## 2019-08-03 NOTE — Progress Notes (Signed)
Physical Therapy Discharge Patient Details Name: Steve [name] MRN: [id number] DOB: [birth year] Today's Date: 08/03/2019 Time:  -     Patient discharged from PT services secondary to medical decline - will need to re-order PT to resume therapy services.      Claretha Cooper 08/03/2019, 12:10 PM [place] Pager [phone] Office [phone]

## 2019-08-03 NOTE — Consult Note (Signed)
Palliative care consult note  Reason for consult: Goals of care, social support, and symptom management in light of Hodgkin's lymphoma with recent Covid infection and subsequent ventilatory dependent respiratory failure with spontaneous pneumothorax  Palliative care consult received.  Discussed case with [name] and Dr. Carlis Abbott.  Chart reviewed including personal review of pertinent labs and imaging.  Discussed with bedside RN.  Briefly, Steve [name] is a 64 year old male with past medical history of Hodgkin's lymphoma on chemotherapy prior, prior Covid infection 05/2019, septic arthritis of the hips, diabetes, OSA, hypertension, pancreatitis, GERD who was admitted with acute respiratory failure with hypoxemia.  He has had a complicated hospital course with admission to the ICU on 3/29.  He required high flow nasal cannula and worsening respiratory resulted in failure of BiPAP and intubation on 4/8.  He had bilateral chest tubes placed due to spontaneous pneumothorax with pneumomediastinum.  He is status post tracheostomy on 4/20.  He was treated for Candida glabrata in BAL and suspected PJP pneumonia.  There have been multiple conversations with his wife regarding concern that Steve [name] has a long road ahead of him with likely weeks to see any improvement in respiratory and concern that with his multiple comorbid conditions and underlying lymphoma he is in a very compromised state overall.  Palliative consulted for goals of care, social support, and symptom management.  I met today with patient's wife, Steve [name], at [facility] bedside.  I introduced palliative care as specialized medical care for people living with serious illness. It focuses on providing relief from the symptoms and stress of a serious illness. The goal is to improve quality of life for both the patient and the family.  Steve [name] is welcoming and open to conversation.  She reports that Steve [name] has been a little more interactive at times today  with attempting to open his eyes.  We talked about things that are most important to Steve [name].  She reports the most important things to Steve [name] have been his family.  She and Steve [name] have been together for 27 years.  Steve [name] has 4 children including a 54 year old son and 74 year old daughter from previous relationship.  Steve [name] and Steve [name] have 2 children, 30-year-old and 74-year-old girls.  She reports that their children know that Steve [name] is very sick and she and family support them as best possible.  Steve [name] reports that he has been a hard-working man his entire life and worked as a Building control surveyor.  His hobbies include drag racing (he often worked in the Audiological scientist) and golf.  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  We spent time discussing [name]'s clinical course this admission with prior treatments for Hodgkin's lymphoma and prior Covid infection.  Discussed his current situation with recent trach placement and that Steve [name] is facing a long road ahead if the goal is to get back to a point of being well enough to have more treatment for his underlying lymphoma.  Steve [name] expressed understanding this and stated that she and Steve [name] will continue to support each other and remain committed to continuation of any and all interventions to extend his life.  Steve [name] expressed that Steve [name] had previously stated that he wanted "anything needed to keep me alive" as long as he continues to have some sort of brain function.  Throughout encounter today, Steve [name] appears very frail.  He is unable to speak but does reliably open his eyes when certain subjects are discussed (primarily golf).  No JVD is appreciated but he has significant subcutaneous emphysema.  Heart is regular  rate and rhythm with no murmur appreciated.  Good air movement with tracheostomy in place and ventilator support.  Abdomen is soft and nontender.  Skin is warm and dry.  -Full code/full scope treatment -Steve [name] has a clear understanding of the long road ahead of Steve [name] and concern if he will be  able to make meaningful recovery.  At this time, she is clear that they are invested in plan for continuation of any and all aggressive interventions.  She is open to continued follow-up for support and further conversation depending upon his clinical course moving forward. -Steve [name] has 2 young children at home.  I plan to drop off a copy of How to Help Children Through a Parent's Serious Illness for [facility]. Steve [name] has my contact information and will call if we can be of further support or if she sees signs that Steve [name] is having increased symptom burden. -Palliative medicine team will continue to follow for further support in conversation based upon his clinical course.  Time in: 1115 Time out: 1215 Total time: 60 minutes  Greater than 50%  of this time was spent counseling and coordinating care related to the above assessment and plan.  Micheline Rough, MD [place] Team [phone]

## 2019-08-03 NOTE — Progress Notes (Signed)
NAME:  Steve [name], MRN:  [id number], DOB:  1956-02-14, LOS: 32 ADMISSION DATE:  07/08/2019, CONSULTATION DATE:  3/29 REFERRING MD:  [name], CHIEF COMPLAINT:  dyspnea   Brief History   64 y/o male, with prior COVID infection 05/2019, admitted with severe acute respiratory failure with hypoxemia in the setting of receiving chemotherapy for Hodgkin's lymphoma.  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 ARDS. Bilateral PTX s/p CTx2 4/19 Remains on vent 60%/PEEP 8, likely to need trach   Consults:  ID >> s/o 4/09 Oncology >> s/o 4/05  Procedures:  4/8 ETT > 4/20 4/20 Trach >  4/9 bilateral chest tubes  Significant Diagnostic Tests:   CTA chest 3/29 > no PE, diffuse GGO upper lobes, patchy consolidation in bases, peripheral fibrotic changes noted on prior CT chest still present  ECHO 3/30 >LVEF 60 to 31%, grade 1 diastolic dysfunction. Mildly dilated LA, normal RV. Trivial MR and TR,otherwise normal valves  CT Intraperitoneal air, likely extension of the pneumomediastinum/pneumothorax. An intra-abdominal etiology such as bowel perforation is favored less likely.Extensive pneumomediastinum and partially visualized right pneumothorax. Diarrheal state. Correlation with clinical exam and stool cultures recommended. No bowel obstruction. Normal appendix. Sigmoid diverticulosis  CT head 4/22 > NAICP, sub cutaneous air in neck, bilateral mastoid effusions  Micro Data:  UC 3/29 >>negative  BCx2 3/29 >>negative  PJP 4/2 >>negative  Fungitel 3/29 >> positive Blastomyces Antigen 3/20 >> negative Respiratory Culture 4/4 >> staph epidermis >> S-vanco, clinda BAL 4/8 >>candida glabrata Fungal  (BAL) 4/8 >> Candida glabrata PJP (BAL) 4/8 >>negative  Aspergillus antigen (BAL) 4/8 >> Negative  Antimicrobials:  Cefepime 3/29 >>3/31 Vanc 3/29 >>3/30 Azithromycin 3/29 >> 4/1 Fluconazole 3/29 >> 4/13 Meropenem 3/31 >> 4/6 Bactrim 3/31 >> 4/4  Clindamycin 4/5 >>4/20 Primaquine 4/5 >>4/20 Anidulafungin 4/12  >>4/20  Interim history/subjective:  Weaning on pressure support 15/5 Facial swelling better No overnight events reported  Objective   Blood pressure 98/60, pulse (!) 103, temperature 100 F (37.8 C), temperature source Bladder, resp. rate (!) 35, height _0  (1.676 m), weight 65.4 kg, SpO2 94 %.    Vent Mode: PRVC FiO2 (%):  [45 %-50 %] 45 % Set Rate:  [26 bmp] 26 bmp Vt Set:  [380 mL] 380 mL PEEP:  [8 cmH20] 8 cmH20 Plateau Pressure:  [22 cmH20-27 cmH20] 26 cmH20   Intake/Output Summary (Last 24 hours) at 08/03/2019 1112 Last data filed at 08/03/2019 5176 Gross per 24 hour  Intake 2176.38 ml  Output 2155 ml  Net 21.38 ml   Filed Weights   07/30/19 0318 07/31/19 0500 08/01/19 0403  Weight: 69.9 kg 63.2 kg 65.4 kg    Examination: General: Appears comfortable HENT: Trach tube in place PULM: Clear to auscultation CV: RRR, no mgr GI: BS+, soft, nontender Sedate  Chest tube still with air leak on right  Resolved Hospital Problem list   Candida glabrata in BAL PJP pneumonia, suspected, treated for 21 days  Assessment & Plan:  ARDS, see previous discussions about potential etiology Central apnea?  -Daily pressure support trials -Tracheostomy tube care per routine -Continue mechanical ventilatory support   Spontaneous pneumothoraces Pneumomediastinum Sub cutaneous emphysema, peritoneal free air -Continue chest tubes to suction -Chest x-ray today reviewed  by myself showing probable right pneumo, unchanged from previous -Continue to monitor drainage output from chest tubes  Acute metabolic encephalopathy > improved 4/18> 4/23, starting to  follow commands Need for sedation for mechanical ventilation Fentanyl tachyphylaxis At risk for encephalopathy due to hypercarbia -RASS target goal of 0 -Frequent orientation -Low-dose narcotic if needed for pain control   Anemia of chronic illness Hodgkin lymphoma Transfuse per protocol -Continue to monitor for bleeding  Hyperlipidemia statin  Severe protein calorie malnutrition Tube feeding  Physical deconditioning PT consult  Goals of care 4/23 lengthy conversation with his wife.  I told her that this process will take weeks and even with our best efforts he will suffer during that time.  She understands and is willing to proceed.  She understands we feel his prognosis is uncertain. Specifically I told her that his lungs may take longer to recover than the rest of his body (with lymphoma) can last.  Appreciate palliative care involvement.    Best practice:  Diet: tube feeding Pain/Anxiety/Delirium protocol (if indicated): yes, as above VAP protocol (if indicated): yes DVT prophylaxis: lovenox GI prophylaxis: PPI Glucose control: SSI Mobility: bed rest Code Status: full Family Communication: wife updated 4/23 bedside Disposition: remain in ICU  Labs   CBC: Recent Labs  Lab 07/28/19 0417 07/28/19 0417 07/29/19 0531 07/30/19 0356 07/30/19 2106 07/31/19 0415 08/03/19 0230  WBC 17.3*   < > 15.1* 8.8 11.4* 8.9 10.2  NEUTROABS 15.1*  --  13.1*  --  9.5*  --  7.6  HGB 8.5*   < > 8.3* 8.1* 8.6* 8.4* 8.5*  HCT 28.8*   < > 27.9* 27.5* 30.0* 29.1* 30.0*  MCV 103.2*   < > 101.8* 105.8* 105.6* 107.0* 108.7*  PLT 152   < > 125* 119* 131* 121* 121*   < > = values in this interval not displayed.    Basic Metabolic Panel: Recent Labs  Lab 07/30/19 2106 07/31/19 0415 08/01/19 0401 08/02/19 0326 08/03/19 0230  NA 144 143 144 147* 148*  K 4.4 3.7 3.7 3.7 3.5  CL 86* 85* 84* 85* 84*  CO2 50* >50* 50* >50* >50*  GLUCOSE 151* 143* 145* 109* 188*  BUN 34* 33* 37* 35*  44*  CREATININE 0.35* 0.35* 0.42* 0.39* 0.51*  CALCIUM 8.5* 8.6* 8.5* 8.5* 8.6*  MG  --  2.0 2.0 2.0 2.1  PHOS  --  2.7 3.1 2.2* 3.2   GFR: Estimated Creatinine Clearance: 85.3 mL/min (A) (by C-G formula based on SCr of 0.51 mg/dL (L)). Recent Labs  Lab 07/30/19 0356 07/30/19 2106 07/31/19 0415 08/03/19 0230  WBC 8.8 11.4* 8.9 10.2    Liver Function Tests: Recent Labs  Lab 07/28/19 0417 07/29/19 0531 07/30/19 2106 08/03/19 0230  AST 32 31 25 33  ALT 38 38 34 40  ALKPHOS 76 89 88 105  BILITOT 0.6 0.6 0.6 0.6  PROT 5.9* 5.6* 6.3* 6.4*  ALBUMIN 2.3* 2.1* 2.4* 2.5*   No results for input(s): LIPASE, AMYLASE in the last 168 hours. No results for input(s): AMMONIA in the last 168 hours.  ABG    Component Value Date/Time   PHART 7.393 07/28/2019 2359   PCO2ART 87.5 (HH) 07/28/2019 2359   PO2ART 64.7 (L) 07/28/2019 2359   HCO3 52.1 (H) 07/28/2019 2359   TCO2 28 07/08/2019 1331   O2SAT 91.8 07/28/2019 2359     Coagulation Profile: Recent Labs  Lab 07/30/19 1337  INR 1.0    Cardiac Enzymes: No results for  input(s): CKTOTAL, CKMB, CKMBINDEX, TROPONINI in the last 168 hours.  HbA1C: Hgb A1c MFr Bld  Date/Time Value Ref Range Status  07/09/2019 05:10 AM 7.3 (H) 4.8 - 5.6 % Final    Comment:    (NOTE) Pre diabetes:          5.7%-6.4% Diabetes:              >6.4% Glycemic control for   <7.0% adults with diabetes   05/19/2019 03:56 AM 8.3 (H) 4.8 - 5.6 % Final    Comment:    (NOTE) Pre diabetes:          5.7%-6.4% Diabetes:              >6.4% Glycemic control for   <7.0% adults with diabetes     CBG: Recent Labs  Lab 08/02/19 1544 08/02/19 1941 08/02/19 2334 08/03/19 0323 08/03/19 0755  GLUCAP 134* 140* 132* 168* 120*    The patient is critically ill with multiple organ systems failure and requires high complexity decision making for assessment and support, frequent evaluation and titration of therapies, application of advanced monitoring  technologies and extensive interpretation of multiple databases. Critical Care Time devoted to patient care services described in this note independent of APP/resident time (if applicable)  is 32 minutes.   Sherrilyn Rist MD [place] [facility] Personal pager: [phone] If unanswered, please page CCM On-call: [phone]

## 2019-08-04 DIAGNOSIS — C8100 Nodular lymphocyte predominant Hodgkin lymphoma, unspecified site: Secondary | ICD-10-CM | POA: Diagnosis not present

## 2019-08-04 DIAGNOSIS — J189 Pneumonia, unspecified organism: Secondary | ICD-10-CM | POA: Diagnosis not present

## 2019-08-04 DIAGNOSIS — Z7189 Other specified counseling: Secondary | ICD-10-CM | POA: Diagnosis not present

## 2019-08-04 DIAGNOSIS — Z515 Encounter for palliative care: Secondary | ICD-10-CM | POA: Diagnosis not present

## 2019-08-04 DIAGNOSIS — J9601 Acute respiratory failure with hypoxia: Secondary | ICD-10-CM | POA: Diagnosis not present

## 2019-08-04 LAB — BASIC METABOLIC PANEL
Anion gap: 11 (ref 5–15)
BUN: 43 mg/dL — ABNORMAL HIGH (ref 8–23)
CO2: 53 mmol/L — ABNORMAL HIGH (ref 22–32)
Calcium: 8.4 mg/dL — ABNORMAL LOW (ref 8.9–10.3)
Chloride: 86 mmol/L — ABNORMAL LOW (ref 98–111)
Creatinine, Ser: 0.52 mg/dL — ABNORMAL LOW (ref 0.61–1.24)
GFR calc Af Amer: >60 mL/min (ref >60)
GFR calc non Af Amer: >60 mL/min (ref >60)
Glucose, Bld: 157 mg/dL — ABNORMAL HIGH (ref 70–99)
Potassium: 3.2 mmol/L — ABNORMAL LOW (ref 3.5–5.1)
Sodium: 150 mmol/L — ABNORMAL HIGH (ref 135–145)

## 2019-08-04 LAB — GLUCOSE, CAPILLARY
Glucose-Capillary: 102 mg/dL — ABNORMAL HIGH (ref 70–99)
Glucose-Capillary: 120 mg/dL — ABNORMAL HIGH (ref 70–99)
Glucose-Capillary: 131 mg/dL — ABNORMAL HIGH (ref 70–99)
Glucose-Capillary: 137 mg/dL — ABNORMAL HIGH (ref 70–99)
Glucose-Capillary: 151 mg/dL — ABNORMAL HIGH (ref 70–99)
Glucose-Capillary: 157 mg/dL — ABNORMAL HIGH (ref 70–99)

## 2019-08-04 MED ORDER — POTASSIUM CHLORIDE 20 MEQ/15ML (10%) PO SOLN
40.0000 meq | ORAL | Status: AC
  Administered 2019-08-04 (×2): 40 meq via ORAL
  Filled 2019-08-04 (×2): qty 30

## 2019-08-04 MED ORDER — FREE WATER
200.0000 mL | Freq: Three times a day (TID) | Status: DC
  Administered 2019-08-04 – 2019-08-05 (×3): 200 mL

## 2019-08-04 NOTE — Progress Notes (Signed)
Palliative care progress note  Reason for consult: Goals of care, social support, and symptom management of Hodgkin's lymphoma with recent Covid infection and subsequent ventilatory dependent respiratory failure with spontaneous pneumothorax  I saw and examined Steve [name] today and spoke again with his wife, Steve [name].  On examination today, Steve [name] is much more awake and interactive.  He nods appropriately to questions and attempts to mouth some words.  He indicates that he is having pain, and I discussed with him regarding the fact that he has pain medicine available.  We discussed that this could make him more sleepy, and at this point in time he indicates that he does not want medication but may ask for some later when [facility] leaves for the day.  Based upon the improvement in his mental status today, Steve [name] indicates that she believes that he needs more time to improve and they remain invested in plan for continued aggressive care.  -Full code/full scope treatment. -Steve [name] his wife has good understanding of the long road ahead for Steve [name].  She reports seeing him with his eyes open and able to interact with her as being a sign that Steve [name] wants to "keep going."  They remain invested in plan for continuation of any and all aggressive interventions.  Total time: 20 minutes  Greater than 50%  of this time was spent counseling and coordinating care related to the above assessment and plan.  Micheline Rough, MD [facility] [phone]

## 2019-08-04 NOTE — Progress Notes (Signed)
NAME:  Steve [name], MRN:  [id number], DOB:  [birth year], LOS: 42 ADMISSION DATE:  07/08/2019, CONSULTATION DATE:  3/29 REFERRING MD:  [name], CHIEF COMPLAINT:  dyspnea   Brief History   64 y/o male, with prior COVID infection 05/2019, admitted with severe acute respiratory failure with hypoxemia in the setting of receiving chemotherapy for Hodgkin's lymphoma.  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 ARDS. Bilateral PTX s/p CTx2 4/19 Remains on vent 60%/PEEP 8, likely to need trach  4/20-trached Consults:  ID >> s/o 4/09 Oncology >> s/o 4/05  Procedures:  4/8 ETT > 4/20 4/20 Trach >  4/9 bilateral chest tubes  Significant Diagnostic Tests:   CTA chest 3/29 > no PE, diffuse GGO upper lobes, patchy consolidation in bases, peripheral fibrotic changes noted on prior CT chest still present  ECHO 3/30 >LVEF 60 to 93%, grade 1 diastolic dysfunction. Mildly dilated LA, normal RV. Trivial MR and TR,otherwise normal valves  CT Intraperitoneal air, likely extension of the pneumomediastinum/pneumothorax. An intra-abdominal etiology such as bowel perforation is favored less likely.Extensive pneumomediastinum and partially visualized right pneumothorax. Diarrheal state. Correlation with clinical exam and stool cultures recommended. No bowel obstruction. Normal appendix. Sigmoid diverticulosis  CT head 4/22 > NAICP, sub cutaneous air in neck, bilateral mastoid effusions  Micro Data:  UC 3/29 >>negative  BCx2 3/29 >>negative  PJP 4/2 >>negative  Fungitel 3/29 >> positive Blastomyces Antigen 3/20 >> negative Respiratory Culture 4/4 >> staph epidermis >> S-vanco, clinda BAL 4/8 >>candida  glabrata Fungal (BAL) 4/8 >> Candida glabrata PJP (BAL) 4/8 >>negative  Aspergillus antigen (BAL) 4/8 >> Negative  Antimicrobials:  Cefepime 3/29 >>3/31 Vanc 3/29 >>3/30 Azithromycin 3/29 >> 4/1 Fluconazole 3/29 >> 4/13 Meropenem 3/31 >> 4/6 Bactrim 3/31 >> 4/4  Clindamycin 4/5 >>4/20 Primaquine 4/5 >>4/20 Anidulafungin 4/12  >>4/20  Interim history/subjective:  No overnight events reported  Objective   Blood pressure 112/68, pulse 97, temperature 98.8 F (37.1 C), resp. rate (!) 31, height _0  (1.676 m), weight 66.3 kg, SpO2 90 %.    Vent Mode: PRVC FiO2 (%):  [40 %-45 %] 40 % Set Rate:  [26 bmp] 26 bmp Vt Set:  [380 mL] 380 mL PEEP:  [8 cmH20] 8 cmH20 Plateau Pressure:  [22 cmH20-29 cmH20] 24 cmH20   Intake/Output Summary (Last 24 hours) at 08/04/2019 1643 Last data filed at 08/04/2019 1505 Gross per 24 hour  Intake 2019.39 ml  Output 1650 ml  Net 369.39 ml   Filed Weights   07/31/19 0500 08/01/19 0403 08/03/19 1353  Weight: 63.2 kg 65.4 kg 66.3 kg    Examination: General: Appears comfortable HENT: Trach tube in place PULM: Decreased air entry bilaterally but clear CV: RRR, no mgr GI: Bowel sounds appreciated, soft Sedate  Chest tube-still with air leak on the right  Resolved Hospital Problem list   Candida glabrata in BAL PJP pneumonia, suspected, treated for 21 days  Assessment & Plan:  ARDS, see previous discussions about potential etiology Central apnea?  -Daily pressure support trials -Tracheostomy tube care per routine -Continue mechanical ventilatory support -Weaning as tolerated  Spontaneous pneumothoraces Pneumomediastinum Sub cutaneous emphysema, peritoneal free air -Continue chest tubes to suction -Chest x-ray today reviewed by myself showing probable right pneumo,  unchanged from previous -Continue to monitor drainage output from chest tubes  Acute metabolic encephalopathy > improved 4/18> 4/23, starting to follow  commands Need for sedation for mechanical ventilation Fentanyl tachyphylaxis At risk for encephalopathy due to hypercarbia -RASS target goal of 0 -Frequent orientation -Low-dose narcotic if needed for pain control  Hypernatremia -Free water  Anemia of chronic illness Hodgkin lymphoma Transfuse per protocol -Continue to monitor for bleeding  Hyperlipidemia statin  Severe protein calorie malnutrition Tube feeding  Physical deconditioning PT consult  Appreciate palliative care assistance with care  Order labs for a.m.  Goals of care 4/23 lengthy conversation with his wife.  was told her that this process will take weeks and even with our best efforts he will suffer during that time.  She understands and is willing to proceed.  She understands we feel his prognosis is uncertain. Specifically I told her that his lungs may take longer to recover than the rest of his body (with lymphoma) can last.  Appreciate palliative care involvement.    Best practice:  Diet: tube feeding Pain/Anxiety/Delirium protocol (if indicated): yes, as above VAP protocol (if indicated): yes DVT prophylaxis: lovenox GI prophylaxis: PPI Glucose control: SSI Mobility: bed rest Code Status: full Family Communication: wife updated 4/23 bedside Disposition: remain in ICU  Labs   CBC: Recent Labs  Lab 07/29/19 0531 07/30/19 0356 07/30/19 2106 07/31/19 0415 08/03/19 0230  WBC 15.1* 8.8 11.4* 8.9 10.2  NEUTROABS 13.1*  --  9.5*  --  7.6  HGB 8.3* 8.1* 8.6* 8.4* 8.5*  HCT 27.9* 27.5* 30.0* 29.1* 30.0*  MCV 101.8* 105.8* 105.6* 107.0* 108.7*  PLT 125* 119* 131* 121* 121*    Basic Metabolic Panel: Recent Labs  Lab 07/31/19 0415 08/01/19 0401 08/02/19 0326 08/03/19 0230 08/04/19 0315  NA 143 144 147* 148* 150*  K 3.7 3.7 3.7 3.5 3.2*  CL 85* 84* 85* 84* 86*  CO2 >50* 50* >50* >50* 53*  GLUCOSE 143* 145* 109* 188* 157*  BUN 33* 37* 35* 44* 43*  CREATININE 0.35* 0.42* 0.39* 0.51* 0.52*   CALCIUM 8.6* 8.5* 8.5* 8.6* 8.4*  MG 2.0 2.0 2.0 2.1  --   PHOS 2.7 3.1 2.2* 3.2  --    GFR: Estimated Creatinine Clearance: 85.3 mL/min (A) (by C-G formula based on SCr of 0.52 mg/dL (L)). Recent Labs  Lab 07/30/19 0356 07/30/19 2106 07/31/19 0415 08/03/19 0230  WBC 8.8 11.4* 8.9 10.2    Liver Function Tests: Recent Labs  Lab 07/29/19 0531 07/30/19 2106 08/03/19 0230  AST 31 25 33  ALT 38 34 40  ALKPHOS 89 88 105  BILITOT 0.6 0.6 0.6  PROT 5.6* 6.3* 6.4*  ALBUMIN 2.1* 2.4* 2.5*   No results for input(s): LIPASE, AMYLASE in the last 168 hours. No results for input(s): AMMONIA in the last 168 hours.  ABG    Component Value Date/Time   PHART 7.393 07/28/2019 2359   PCO2ART 87.5 (HH) 07/28/2019 2359   PO2ART 64.7 (L) 07/28/2019 2359   HCO3 52.1 (H) 07/28/2019 2359   TCO2 28 07/08/2019 1331   O2SAT 91.8 07/28/2019 2359     Coagulation Profile: Recent Labs  Lab 07/30/19 1337  INR 1.0    Cardiac Enzymes: No results for input(s): CKTOTAL, CKMB, CKMBINDEX, TROPONINI in the last 168 hours.  HbA1C: Hgb A1c MFr Bld  Date/Time Value Ref Range Status  07/09/2019 05:10 AM 7.3 (H) 4.8 - 5.6 % Final    Comment:    (NOTE) Pre diabetes:  5.7%-6.4% Diabetes:              >6.4% Glycemic control for   <7.0% adults with diabetes   05/19/2019 03:56 AM 8.3 (H) 4.8 - 5.6 % Final    Comment:    (NOTE) Pre diabetes:          5.7%-6.4% Diabetes:              >6.4% Glycemic control for   <7.0% adults with diabetes     CBG: Recent Labs  Lab 08/03/19 2334 08/04/19 0352 08/04/19 0739 08/04/19 1202 08/04/19 1624  GLUCAP 160* 137* 102* 151* 157*   The patient is critically ill with multiple organ systems failure and requires high complexity decision making for assessment and support, frequent evaluation and titration of therapies, application of advanced monitoring technologies and extensive interpretation of multiple databases. Critical Care Time devoted to  patient care services described in this note independent of APP/resident time (if applicable)  is 30 minutes.   Sherrilyn Rist MD [place] [facility] Personal pager: [phone] If unanswered, please page CCM On-call: [phone]

## 2019-08-05 ENCOUNTER — Inpatient Hospital Stay (HOSPITAL_COMMUNITY): Payer: BC Managed Care – PPO

## 2019-08-05 DIAGNOSIS — J9601 Acute respiratory failure with hypoxia: Secondary | ICD-10-CM | POA: Diagnosis not present

## 2019-08-05 LAB — BASIC METABOLIC PANEL
BUN: 48 mg/dL — ABNORMAL HIGH (ref 8–23)
BUN: 48 mg/dL — ABNORMAL HIGH (ref 8–23)
CO2: >50 mmol/L — ABNORMAL HIGH (ref 22–32)
CO2: >50 mmol/L — ABNORMAL HIGH (ref 22–32)
Calcium: 8.7 mg/dL — ABNORMAL LOW (ref 8.9–10.3)
Calcium: 8.7 mg/dL — ABNORMAL LOW (ref 8.9–10.3)
Chloride: 90 mmol/L — ABNORMAL LOW (ref 98–111)
Chloride: 90 mmol/L — ABNORMAL LOW (ref 98–111)
Creatinine, Ser: 0.53 mg/dL — ABNORMAL LOW (ref 0.61–1.24)
Creatinine, Ser: 0.49 mg/dL — ABNORMAL LOW (ref 0.61–1.24)
GFR calc Af Amer: >60 mL/min (ref >60)
GFR calc Af Amer: >60 mL/min (ref >60)
GFR calc non Af Amer: >60 mL/min (ref >60)
GFR calc non Af Amer: >60 mL/min (ref >60)
Glucose, Bld: 187 mg/dL — ABNORMAL HIGH (ref 70–99)
Glucose, Bld: 133 mg/dL — ABNORMAL HIGH (ref 70–99)
Potassium: 4.1 mmol/L (ref 3.5–5.1)
Potassium: 3.8 mmol/L (ref 3.5–5.1)
Sodium: 152 mmol/L — ABNORMAL HIGH (ref 135–145)
Sodium: 150 mmol/L — ABNORMAL HIGH (ref 135–145)

## 2019-08-05 LAB — CBC WITH DIFFERENTIAL/PLATELET
Abs Immature Granulocytes: 0.02 10*3/uL (ref 0.00–0.07)
Basophils Absolute: 0 10*3/uL (ref 0.0–0.1)
Basophils Relative: 0 %
Eosinophils Absolute: 0.4 10*3/uL (ref 0.0–0.5)
Eosinophils Relative: 5 %
HCT: 29.5 % — ABNORMAL LOW (ref 39.0–52.0)
Hemoglobin: 8.3 g/dL — ABNORMAL LOW (ref 13.0–17.0)
Immature Granulocytes: 0 %
Lymphocytes Relative: 20 %
Lymphs Abs: 1.7 10*3/uL (ref 0.7–4.0)
MCH: 31.3 pg (ref 26.0–34.0)
MCHC: 28.1 g/dL — ABNORMAL LOW (ref 30.0–36.0)
MCV: 111.3 fL — ABNORMAL HIGH (ref 80.0–100.0)
Monocytes Absolute: 0.4 10*3/uL (ref 0.1–1.0)
Monocytes Relative: 5 %
Neutro Abs: 5.8 10*3/uL (ref 1.7–7.7)
Neutrophils Relative %: 70 %
Platelets: 114 10*3/uL — ABNORMAL LOW (ref 150–400)
RBC: 2.65 MIL/uL — ABNORMAL LOW (ref 4.22–5.81)
RDW: 18.6 % — ABNORMAL HIGH (ref 11.5–15.5)
WBC: 8.3 10*3/uL (ref 4.0–10.5)
nRBC: 0 % (ref 0.0–0.2)

## 2019-08-05 LAB — GLUCOSE, CAPILLARY
Glucose-Capillary: 136 mg/dL — ABNORMAL HIGH (ref 70–99)
Glucose-Capillary: 162 mg/dL — ABNORMAL HIGH (ref 70–99)
Glucose-Capillary: 113 mg/dL — ABNORMAL HIGH (ref 70–99)
Glucose-Capillary: 170 mg/dL — ABNORMAL HIGH (ref 70–99)
Glucose-Capillary: 140 mg/dL — ABNORMAL HIGH (ref 70–99)
Glucose-Capillary: 82 mg/dL (ref 70–99)

## 2019-08-05 LAB — MAGNESIUM: Magnesium: 2.2 mg/dL (ref 1.7–2.4)

## 2019-08-05 MED ORDER — ALBUMIN HUMAN 5 % IV SOLN
12.5000 g | Freq: Once | INTRAVENOUS | Status: AC
  Administered 2019-08-05: 12.5 g via INTRAVENOUS
  Filled 2019-08-05: qty 250

## 2019-08-05 MED ORDER — METOPROLOL TARTRATE 5 MG/5ML IV SOLN
INTRAVENOUS | Status: AC
  Filled 2019-08-05: qty 5

## 2019-08-05 MED ORDER — PHENYLEPHRINE HCL-NACL 10-0.9 MG/250ML-% IV SOLN
0.0000 ug/min | INTRAVENOUS | Status: DC
  Administered 2019-08-05: 20 ug/min via INTRAVENOUS
  Administered 2019-08-06: 80 ug/min via INTRAVENOUS
  Administered 2019-08-06: 120 ug/min via INTRAVENOUS
  Filled 2019-08-05 (×2): qty 250

## 2019-08-05 MED ORDER — FREE WATER
200.0000 mL | Status: DC
  Administered 2019-08-05 – 2019-08-18 (×80): 200 mL

## 2019-08-05 MED ORDER — SODIUM CHLORIDE 0.9 % IV BOLUS
250.0000 mL | Freq: Once | INTRAVENOUS | Status: AC
  Administered 2019-08-05: 250 mL via INTRAVENOUS

## 2019-08-05 MED ORDER — METOPROLOL TARTRATE 5 MG/5ML IV SOLN
5.0000 mg | Freq: Once | INTRAVENOUS | Status: AC
  Administered 2019-08-05: 5 mg via INTRAVENOUS

## 2019-08-05 MED ORDER — PHENYLEPHRINE HCL-NACL 10-0.9 MG/250ML-% IV SOLN
INTRAVENOUS | Status: AC
  Filled 2019-08-05: qty 250

## 2019-08-05 NOTE — Progress Notes (Signed)
Palliative care brief progress note  I stop by to check in on Steve [name] this morning.  He did not arouse with gentle verbal or tactile stimulation.  I called and spoke with his wife, Steve [name].  We discussed again that she is working to [place] while also caring for her and supporting their 2 young children (ages 53 and 26) at home.  I left a copy of How to Help Children Through a Parent's Serious Illness for her at the bedside.  Goals are clear with plan for continued aggressive care moving forward.  Steve [name] has our contact and will reach out if we can be of assistance.  PMT to continue to check in periodically for support, but we will not be rounding on [employer] daily.  Please call if there are specific palliative needs with which we can be of assistance.  Micheline Rough, MD [place] [facility] Team [phone]  NO CHARGE NOTE

## 2019-08-05 NOTE — Progress Notes (Addendum)
NAME:  Steve [name], MRN:  [id number], DOB:  Sep 04, 1955, LOS: 24 ADMISSION DATE:  07/08/2019, CONSULTATION DATE:  3/29 REFERRING MD:  [name], CHIEF COMPLAINT:  dyspnea   Brief History   64 y/o male, with prior COVID infection 05/2019, admitted with severe acute respiratory failure with hypoxemia in the setting of receiving chemotherapy for Hodgkin's lymphoma.  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 ARDS. Bilateral PTX s/p CTx2 4/19 Remains on vent 60%/PEEP 8, likely to need trach  4/20 trached  Consults:  ID >> s/o 4/09 Oncology >> s/o 4/05  Procedures:  4/8 ETT > 4/20 4/20 Trach >  4/9 bilateral chest tubes  Significant Diagnostic Tests:   CTA chest 3/29 > no PE, diffuse GGO upper lobes, patchy consolidation in bases, peripheral fibrotic changes noted on prior CT chest still present  ECHO 3/30 >LVEF 60 to 20%, grade 1 diastolic dysfunction. Mildly dilated LA, normal RV. Trivial MR and TR,otherwise normal valves  CT Intraperitoneal air, likely extension of the pneumomediastinum/pneumothorax. An intra-abdominal etiology such as bowel perforation is favored less likely.Extensive pneumomediastinum and partially visualized right pneumothorax. Diarrheal state. Correlation with clinical exam and stool cultures recommended. No bowel obstruction. Normal appendix. Sigmoid diverticulosis  CT head 4/22 > NAICP, sub cutaneous air in neck, bilateral mastoid effusions CT Chest w/o 4/26 >>   Micro Data:  UC 3/29 >>negative  BCx2 3/29 >>negative  PJP 4/2 >>negative  Fungitel 3/29 >> positive Blastomyces Antigen 3/20 >> negative Respiratory Culture 4/4 >> staph epidermis >> S-vanco,  clinda BAL 4/8 >>candida glabrata Fungal (BAL) 4/8 >> Candida glabrata PJP (BAL) 4/8 >>negative  Aspergillus antigen (BAL) 4/8 >> Negative  Antimicrobials:  Cefepime 3/29 >>3/31 Vanc 3/29 >>3/30 Azithromycin 3/29 >> 4/1 Fluconazole 3/29 >> 4/13 Meropenem 3/31 >> 4/6 Bactrim 3/31 >> 4/4  Clindamycin 4/5 >>4/20 Primaquine 4/5 >>4/20 Anidulafungin 4/12  >>4/20  Interim history/subjective:  RN reports concern for increase in subcutaneous air in neck / face this am  Remains on vent, 40% FiO2, PEEP 8  Glucose range 120-187 Tmax 99.1 / WBC 8.3 I/O 1.6L UOP, +83m in 24h  Objective   Blood pressure 101/72, pulse 86, temperature 99.1 F (37.3 C), resp. rate (!) 32, height _0  (1.676 m), weight 66.3 kg, SpO2 96 %.    Vent Mode: PRVC FiO2 (%):  [40 %] 40 % Set Rate:  [26 bmp] 26 bmp Vt Set:  [380 mL] 380 mL PEEP:  [8 cmH20] 8 cmH20 Plateau Pressure:  [19 cmH20-27 cmH20] 27 cmH20   Intake/Output Summary (Last 24 hours) at 08/05/2019 0920 Last data filed at 08/05/2019 0518 Gross per 24 hour  Intake 1514.25 ml  Output 1150 ml  Net 364.25 ml   Filed Weights   07/31/19 0500 08/01/19 0403 08/03/19 1353  Weight: 63.2 kg 65.4 kg 66.3 kg    Examination: General: chronically ill appearing adult male lying in bed on vent in NAD  HEENT: MM pink/moist, trach midline c/d/i, pupils =/reactive, noted crepitus in neck/face Neuro: awakens, nods, generalized weakness CV: s1s2 RRR3, no m/r/g PULM: tachypnea but not labored, lungs bilaterally clear, bilateral chest tubes in place, left with no air leak, right with small leak(~1/7) GI: soft, bsx4 active  Extremities: warm/dry, generalized trace edema,  muscle wasting  Skin: no rashes or lesions  STAT CXR 4/26 am >> images personally reviewed, chest tubes in good position, ? tiny right pneumothorax, possible deep sulcus sign? But can not see below diaphragm, no obvious source of increased leak  Resolved Hospital Problem list    Candida glabrata in BAL PJP pneumonia, suspected, treated for 21 days  Assessment & Plan:   ARDS   ? Central apnea  -continue PRVC support -daily PSV trials -trach care per protocol  -follow intermittent CXR  -VAP prevention measures  Spontaneous pneumothoraces Pneumomediastinum Sub cutaneous emphysema, peritoneal free air -STAT CXR with chest tubes in good position on my review -continue chest tubes to 20 cm suction  -chest tube care per protocol  -assess CT chest to better evaluate given air leak  Acute metabolic encephalopathy , improving Need for sedation for mechanical ventilation Fentanyl tachyphylaxis At risk for encephalopathy due to hypercarbia -RASS Goal 0 to -1  -precedex gtt if needed with PRN dilaudid for pain  -frequent reorientation   Hypernatremia -increase free water to 200 ml Q4 -follow Na+ trend  Anemia of chronic illness Hodgkin lymphoma -trend CBC  -transfuse for Hgb <7% or active bleeding   Hyperlipidemia -continue statin   Severe protein calorie malnutrition -continue TF   Physical deconditioning -PT evaluation  Goals of care -appreciate palliative care discussion with family  -4/23 lengthy conversation with his wife.  was told her that this process will take weeks and even with our best efforts he will suffer during that time.  She understands and is willing to proceed.  She understands we feel his prognosis is uncertain. Specifically I told her that his lungs may take longer to recover than the rest of his body (with lymphoma) can last.  Appreciate palliative care involvement.    Best practice:  Diet: tube feeding Pain/Anxiety/Delirium protocol (if indicated): as above VAP protocol (if indicated): yes DVT prophylaxis: lovenox GI prophylaxis: PPI Glucose control: SSI Mobility: bed rest Code Status: full Family Communication: Will update wife on arrival 4/26  Disposition: ICU  Labs   CBC: Recent Labs  Lab 07/30/19 0356  07/30/19 2106 07/31/19 0415 08/03/19 0230 08/05/19 0347  WBC 8.8 11.4* 8.9 10.2 8.3  NEUTROABS  --  9.5*  --  7.6 5.8  HGB 8.1* 8.6* 8.4* 8.5* 8.3*  HCT 27.5* 30.0* 29.1* 30.0* 29.5*  MCV 105.8* 105.6* 107.0* 108.7* 111.3*  PLT 119* 131* 121* 121* 114*    Basic Metabolic Panel: Recent Labs  Lab 07/31/19 0415 07/31/19 0415 08/01/19 0401 08/02/19 0326 08/03/19 0230 08/04/19 0315 08/05/19 0347  NA 143   < > 144 147* 148* 150* 152*  K 3.7   < > 3.7 3.7 3.5 3.2* 4.1  CL 85*   < > 84* 85* 84* 86* 90*  CO2 >50*   < > 50* >50* >50* 53* >50*  GLUCOSE 143*   < > 145* 109* 188* 157* 187*  BUN 33*   < > 37* 35* 44* 43* 48*  CREATININE 0.35*   < > 0.42* 0.39* 0.51* 0.52* 0.53*  CALCIUM 8.6*   < > 8.5* 8.5* 8.6* 8.4* 8.7*  MG 2.0  --  2.0 2.0 2.1  --   --   PHOS 2.7  --  3.1 2.2* 3.2  --   --    < > = values in this interval not displayed.   GFR: Estimated Creatinine Clearance: 85.3 mL/min (A) (by C-G formula based on SCr of 0.53 mg/dL (L)). Recent  Labs  Lab 07/30/19 2106 07/31/19 0415 08/03/19 0230 08/05/19 0347  WBC 11.4* 8.9 10.2 8.3    Liver Function Tests: Recent Labs  Lab 07/30/19 2106 08/03/19 0230  AST 25 33  ALT 34 40  ALKPHOS 88 105  BILITOT 0.6 0.6  PROT 6.3* 6.4*  ALBUMIN 2.4* 2.5*   No results for input(s): LIPASE, AMYLASE in the last 168 hours. No results for input(s): AMMONIA in the last 168 hours.  ABG    Component Value Date/Time   PHART 7.393 07/28/2019 2359   PCO2ART 87.5 (HH) 07/28/2019 2359   PO2ART 64.7 (L) 07/28/2019 2359   HCO3 52.1 (H) 07/28/2019 2359   TCO2 28 07/08/2019 1331   O2SAT 91.8 07/28/2019 2359     Coagulation Profile: Recent Labs  Lab 07/30/19 1337  INR 1.0    Cardiac Enzymes: No results for input(s): CKTOTAL, CKMB, CKMBINDEX, TROPONINI in the last 168 hours.  HbA1C: Hgb A1c MFr Bld  Date/Time Value Ref Range Status  07/09/2019 05:10 AM 7.3 (H) 4.8 - 5.6 % Final    Comment:    (NOTE) Pre diabetes:           5.7%-6.4% Diabetes:              >6.4% Glycemic control for   <7.0% adults with diabetes   05/19/2019 03:56 AM 8.3 (H) 4.8 - 5.6 % Final    Comment:    (NOTE) Pre diabetes:          5.7%-6.4% Diabetes:              >6.4% Glycemic control for   <7.0% adults with diabetes     CBG: Recent Labs  Lab 08/04/19 1624 08/04/19 1942 08/04/19 2351 08/05/19 0311 08/05/19 0749  GLUCAP 157* 131* 120* 136* 162*    CC Time: 49 minutes   Noe Gens, MSN, NP-C [facility] 08/05/2019, 9:20 AM   Please see [email or web address] for pager details.

## 2019-08-05 NOTE — Progress Notes (Signed)
Wife [name]) called and updated on plan of care, CT findings with pneumomediastinum.  CT reviewed with CVTS and Radiology.  Will increase chest tubes to 40 cm suction and monitor closely. Patient remains hemodynamically stable.    Noe Gens, MSN, NP-C [facility] 08/05/2019, 2:48 PM   Please see [email or web address] for pager details.

## 2019-08-05 NOTE — Progress Notes (Signed)
eLink Physician-Brief Progress Note Patient Name: Steve [name] DOB: 1956-03-21 MRN: [id number]   Date of Service  08/05/2019  HPI/Events of Note  Hypotension  eICU Interventions  Albumin  5 % 12.5 gm iv x 1        Steve [name] 08/05/2019, 11:29 PM

## 2019-08-05 NOTE — Evaluation (Signed)
SLP Cancellation Note  Patient Details Name: Steve [name] MRN: [id number] DOB: 12/23/1955   Cancelled treatment:       Reason Eval/Treat Not Completed: Other (comment)(pt remains on vent at this time, will continue efforts)  Kathleen Lime, MS [facility] SLP Acute Rehab Services Office [phone]  [name] 08/05/2019, 1:31 PM

## 2019-08-05 NOTE — Progress Notes (Signed)
Called by charge RN with concerns for possible increase in subcutaneous air in the setting of known bilateral chest tubes. Sites assessed without obvious leak.  No air leak on left, small leak on right.   STAT PCXR to evaluate chest tube positon   Noe Gens, MSN, NP-C [facility] 08/05/2019, 8:54 AM   Please see [email or web address] for pager details.

## 2019-08-06 ENCOUNTER — Inpatient Hospital Stay (HOSPITAL_COMMUNITY): Payer: BC Managed Care – PPO

## 2019-08-06 DIAGNOSIS — J80 Acute respiratory distress syndrome: Secondary | ICD-10-CM | POA: Diagnosis not present

## 2019-08-06 DIAGNOSIS — C8100 Nodular lymphocyte predominant Hodgkin lymphoma, unspecified site: Secondary | ICD-10-CM | POA: Diagnosis not present

## 2019-08-06 DIAGNOSIS — J189 Pneumonia, unspecified organism: Secondary | ICD-10-CM | POA: Diagnosis not present

## 2019-08-06 DIAGNOSIS — J9601 Acute respiratory failure with hypoxia: Secondary | ICD-10-CM | POA: Diagnosis not present

## 2019-08-06 LAB — CBC
HCT: 27 % — ABNORMAL LOW (ref 39.0–52.0)
Hemoglobin: 7.6 g/dL — ABNORMAL LOW (ref 13.0–17.0)
MCH: 31.1 pg (ref 26.0–34.0)
MCHC: 28.1 g/dL — ABNORMAL LOW (ref 30.0–36.0)
MCV: 110.7 fL — ABNORMAL HIGH (ref 80.0–100.0)
Platelets: 113 10*3/uL — ABNORMAL LOW (ref 150–400)
RBC: 2.44 MIL/uL — ABNORMAL LOW (ref 4.22–5.81)
RDW: 18.6 % — ABNORMAL HIGH (ref 11.5–15.5)
WBC: 8.5 10*3/uL (ref 4.0–10.5)
nRBC: 0 % (ref 0.0–0.2)

## 2019-08-06 LAB — GLUCOSE, CAPILLARY
Glucose-Capillary: 156 mg/dL — ABNORMAL HIGH (ref 70–99)
Glucose-Capillary: 118 mg/dL — ABNORMAL HIGH (ref 70–99)
Glucose-Capillary: 158 mg/dL — ABNORMAL HIGH (ref 70–99)
Glucose-Capillary: 129 mg/dL — ABNORMAL HIGH (ref 70–99)
Glucose-Capillary: 132 mg/dL — ABNORMAL HIGH (ref 70–99)
Glucose-Capillary: 140 mg/dL — ABNORMAL HIGH (ref 70–99)

## 2019-08-06 LAB — BASIC METABOLIC PANEL
Anion gap: 8 (ref 5–15)
BUN: 38 mg/dL — ABNORMAL HIGH (ref 8–23)
CO2: 47 mmol/L — ABNORMAL HIGH (ref 22–32)
Calcium: 8.2 mg/dL — ABNORMAL LOW (ref 8.9–10.3)
Chloride: 93 mmol/L — ABNORMAL LOW (ref 98–111)
Creatinine, Ser: 0.37 mg/dL — ABNORMAL LOW (ref 0.61–1.24)
GFR calc Af Amer: >60 mL/min (ref >60)
GFR calc non Af Amer: >60 mL/min (ref >60)
Glucose, Bld: 181 mg/dL — ABNORMAL HIGH (ref 70–99)
Potassium: 3.6 mmol/L (ref 3.5–5.1)
Sodium: 148 mmol/L — ABNORMAL HIGH (ref 135–145)

## 2019-08-06 MED ORDER — JUVEN PO PACK
1.0000 | PACK | Freq: Two times a day (BID) | ORAL | Status: DC
  Administered 2019-08-06 – 2019-08-21 (×26): 1 via ORAL
  Filled 2019-08-06 (×28): qty 1

## 2019-08-06 MED ORDER — PHENYLEPHRINE CONCENTRATED 100MG/250ML (0.4 MG/ML) INFUSION SIMPLE
0.0000 ug/min | INTRAVENOUS | Status: DC
  Administered 2019-08-06: 50 ug/min via INTRAVENOUS
  Administered 2019-08-06: 120 ug/min via INTRAVENOUS
  Administered 2019-08-08 – 2019-08-09 (×2): 90 ug/min via INTRAVENOUS
  Administered 2019-08-10: 60 ug/min via INTRAVENOUS
  Administered 2019-08-11: 70 ug/min via INTRAVENOUS
  Administered 2019-08-12 (×2): 160 ug/min via INTRAVENOUS
  Administered 2019-08-13: 120 ug/min via INTRAVENOUS
  Administered 2019-08-13: 140 ug/min via INTRAVENOUS
  Administered 2019-08-14: 110 ug/min via INTRAVENOUS
  Administered 2019-08-15: 30 ug/min via INTRAVENOUS
  Filled 2019-08-06 (×15): qty 250

## 2019-08-06 MED ORDER — VITAL AF 1.2 CAL PO LIQD
1000.0000 mL | ORAL | Status: DC
  Administered 2019-08-06 – 2019-08-11 (×7): 1000 mL

## 2019-08-06 MED ORDER — PRO-STAT SUGAR FREE PO LIQD
30.0000 mL | Freq: Two times a day (BID) | ORAL | Status: DC
  Administered 2019-08-06 – 2019-08-12 (×12): 30 mL
  Filled 2019-08-06 (×12): qty 30

## 2019-08-06 NOTE — Progress Notes (Addendum)
NAME:  Steve [name], MRN:  [id number], DOB:  03/12/1956, LOS: 10 ADMISSION DATE:  07/08/2019, CONSULTATION DATE:  3/29 REFERRING MD:  [name], CHIEF COMPLAINT:  dyspnea   Brief History   64 y/o male, with prior COVID infection 05/2019, admitted with severe acute respiratory failure with hypoxemia in the setting of receiving chemotherapy for Hodgkin's lymphoma.  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 ARDS. Bilateral PTX s/p CTx2 4/19 Remains on vent 60%/PEEP 8, likely to need trach  4/20 trach placed 4/26 Worsening SQ air, CT with tension pneumomediastinum, chest tube suction increased  Consults:  ID >> s/o 4/09 Oncology >> s/o 4/05  Procedures:  4/8 ETT > 4/20 4/20 Trach >  4/9 bilateral chest tubes  Significant Diagnostic Tests:   CTA chest 3/29 > no PE, diffuse GGO upper lobes, patchy consolidation in bases, peripheral fibrotic changes noted on prior CT chest still present  ECHO 3/30 >LVEF 60 to 15%, grade 1 diastolic dysfunction. Mildly dilated LA, normal RV. Trivial MR and TR,otherwise normal valves  CT Intraperitoneal air, likely extension of the pneumomediastinum/pneumothorax. An intra-abdominal etiology such as bowel perforation is favored less likely.Extensive pneumomediastinum and partially visualized right pneumothorax. Diarrheal state. Correlation with clinical exam and stool cultures recommended. No bowel obstruction. Normal appendix. Sigmoid diverticulosis  CT head 4/22 > NAICP, sub cutaneous air in neck, bilateral mastoid effusions CT Chest w/o 4/26 >> tension pneumomediastinum, bilateral chest tubes in place, continued widespread pulmonary opacity with development of  some bronchiectasis  Micro Data:  UC 3/29 >>negative  BCx2 3/29 >>negative  PJP 4/2 >>negative  Fungitel 3/29 >> positive Blastomyces Antigen 3/20 >> negative Respiratory Culture 4/4 >> staph epidermis >> S-vanco, clinda BAL 4/8 >>candida glabrata Fungal (BAL) 4/8 >> Candida glabrata PJP (BAL) 4/8 >>negative  Aspergillus antigen (BAL) 4/8 >> Negative  Antimicrobials:  Cefepime 3/29 >>3/31 Vanc 3/29 >>3/30 Azithromycin 3/29 >> 4/1 Fluconazole 3/29 >> 4/13 Meropenem 3/31 >> 4/6 Bactrim 3/31 >> 4/4  Clindamycin 4/5 >>4/20 Primaquine 4/5 >>4/20 Anidulafungin 4/12  >>4/20  Interim history/subjective:  Pt given albumin overnight for hypotension  Tmax 99.3 Glucose range 18-180 I/O 1.5L UOP, +663 for 24 hours RN reports sacral decubitus   Objective   Blood pressure (!) 119/56, pulse 79, temperature 99.3 F (37.4 C), resp. rate (!) 23, height _0  (1.676 m), weight 69.6 kg, SpO2 97 %.    Vent Mode: PRVC FiO2 (%):  [40 %] 40 % Set Rate:  [26 bmp] 26 bmp Vt Set:  [380 mL] 380 mL PEEP:  [8 cmH20] 8 cmH20 Plateau Pressure:  [23 cmH20-28 cmH20] 27 cmH20   Intake/Output Summary (Last 24 hours) at 08/06/2019 1424 Last data filed at 08/06/2019 1039 Gross per 24 hour  Intake 2358.85 ml  Output 1753 ml  Net 605.85 ml   Filed Weights   08/01/19 0403 08/03/19 1353 08/06/19 0500  Weight: 65.4 kg 66.3 kg 69.6 kg    Examination: General: chronically ill appearing on vent HEENT: MM pink/moist, trach midline  Neuro: awakens to voice, nods / generalized weakenss CV: s1s2 RRR, no m/r/g PULM:  Non-labored, clear anterior, basilar crackles, bilateral chest tubes GI: soft, bsx4 active  Extremities: warm/dry, trace generalized edema  Skin: no rashes or lesions on  visible skin  CXR 4/27 >> images personally reviewed, small right apical pneumothorax, extensive subcutaneous emphysema, chest tubes / trach in good position   Resolved Hospital Problem list   Candida glabrata  in BAL PJP pneumonia, suspected, treated for 21 days  Assessment & Plan:   ARDS   ? Central apnea  -continue PRVC as rest mode -daily PSV trials -trach care per protocol  -follow intermittent CXR  -VAP prevention measures  Spontaneous pneumothoraces Pneumomediastinum Sub cutaneous emphysema, peritoneal free air -continue chest tubes to 40 cm suction -follow daily CXR while CT's in place  -chest tube care per protocol  Acute metabolic encephalopathy , improving Need for sedation for mechanical ventilation Fentanyl tachyphylaxis At risk for encephalopathy due to hypercarbia -RASS Goal 0 to -1  -PRN dilaudid for pain -frequent reorientation   Hypernatremia -continue free water 200 ml Q4 -follow BMP   Anemia of chronic illness Hodgkin lymphoma -follow CBC  -transfuse for Hgb <7%  Hyperlipidemia -statin as ordered   Severe protein calorie malnutrition -TF per nutrition   Physical deconditioning -PT efforts as able   Sacral Decubitus  -WOC consult for assessment / dressing needs  Goals of care -appreciate palliative care discussion with family  -4/23 lengthy conversation with his wife.  was told her that this process will take weeks and even with our best efforts he will suffer during that time.  She understands and is willing to proceed.  She understands we feel his prognosis is uncertain. Specifically I told her that his lungs may take longer to recover than the rest of his body (with lymphoma) can last.  Appreciate palliative care involvement.   -4/26 wife updated via phone, continues to wish for full scope care  Best practice:  Diet: tube feeding Pain/Anxiety/Delirium protocol (if indicated): as above VAP protocol (if indicated): yes DVT prophylaxis: lovenox GI prophylaxis: PPI Glucose control: SSI Mobility: bed rest Code Status: full Family Communication: Will update on arrival 4/27 Disposition: ICU  Labs   CBC: Recent Labs  Lab 07/30/19 2106  07/31/19 0415 08/03/19 0230 08/05/19 0347 08/06/19 0412  WBC 11.4* 8.9 10.2 8.3 8.5  NEUTROABS 9.5*  --  7.6 5.8  --   HGB 8.6* 8.4* 8.5* 8.3* 7.6*  HCT 30.0* 29.1* 30.0* 29.5* 27.0*  MCV 105.6* 107.0* 108.7* 111.3* 110.7*  PLT 131* 121* 121* 114* 113*    Basic Metabolic Panel: Recent Labs  Lab 07/31/19 0415 07/31/19 0415 08/01/19 0401 08/01/19 0401 08/02/19 0326 08/02/19 0326 08/03/19 0230 08/04/19 0315 08/05/19 0347 08/05/19 2115 08/06/19 0412  NA 143   < > 144   < > 147*   < > 148* 150* 152* 150* 148*  K 3.7   < > 3.7   < > 3.7   < > 3.5 3.2* 4.1 3.8 3.6  CL 85*   < > 84*   < > 85*   < > 84* 86* 90* 90* 93*  CO2 >50*   < > 50*   < > >50*   < > >50* 53* >50* >50* 47*  GLUCOSE 143*   < > 145*   < > 109*   < > 188* 157* 187* 133* 181*  BUN 33*   < > 37*   < > 35*   < > 44* 43* 48* 48* 38*  CREATININE 0.35*   < > 0.42*   < > 0.39*   < > 0.51* 0.52* 0.53* 0.49* 0.37*  CALCIUM 8.6*   < > 8.5*   < >  8.5*   < > 8.6* 8.4* 8.7* 8.7* 8.2*  MG 2.0  --  2.0  --  2.0  --  2.1  --   --  2.2  --   PHOS 2.7  --  3.1  --  2.2*  --  3.2  --   --   --   --    < > = values in this interval not displayed.   GFR: Estimated Creatinine Clearance: 85.3 mL/min (A) (by C-G formula based on SCr of 0.37 mg/dL (L)). Recent Labs  Lab 07/31/19 0415 08/03/19 0230 08/05/19 0347 08/06/19 0412  WBC 8.9 10.2 8.3 8.5    Liver Function Tests: Recent Labs  Lab 07/30/19 2106 08/03/19 0230  AST 25 33  ALT 34 40  ALKPHOS 88 105  BILITOT 0.6 0.6  PROT 6.3* 6.4*  ALBUMIN 2.4* 2.5*   No results for input(s): LIPASE, AMYLASE in the last 168 hours. No results for input(s): AMMONIA in the last 168 hours.  ABG    Component Value Date/Time   PHART 7.393 07/28/2019 2359   PCO2ART 87.5 (HH) 07/28/2019 2359   PO2ART 64.7 (L) 07/28/2019 2359   HCO3 52.1 (H) 07/28/2019 2359   TCO2 28 07/08/2019 1331   O2SAT 91.8 07/28/2019 2359     Coagulation Profile: No results for input(s): INR, PROTIME in  the last 168 hours.  Cardiac Enzymes: No results for input(s): CKTOTAL, CKMB, CKMBINDEX, TROPONINI in the last 168 hours.  HbA1C: Hgb A1c MFr Bld  Date/Time Value Ref Range Status  07/09/2019 05:10 AM 7.3 (H) 4.8 - 5.6 % Final    Comment:    (NOTE) Pre diabetes:          5.7%-6.4% Diabetes:              >6.4% Glycemic control for   <7.0% adults with diabetes   05/19/2019 03:56 AM 8.3 (H) 4.8 - 5.6 % Final    Comment:    (NOTE) Pre diabetes:          5.7%-6.4% Diabetes:              >6.4% Glycemic control for   <7.0% adults with diabetes     CBG: Recent Labs  Lab 08/05/19 1931 08/05/19 2327 08/06/19 0403 08/06/19 0756 08/06/19 1114  GLUCAP 140* 82 156* 118* 158*    CC Time: 32 minutes   Noe Gens, MSN, NP-C [facility] 08/06/2019, 2:24 PM   Please see [email or web address] for pager details.

## 2019-08-06 NOTE — Progress Notes (Signed)
Nutrition Follow-up  DOCUMENTATION CODES:   Non-severe (moderate) malnutrition in context of chronic illness  INTERVENTION:  - will adjust TF regimen: Vital AF 1.2 @ 50 ml/hr with 30 ml prostat BID and 1 packet juven BID. - this regimen will provide 1830 kcal, 125 grams protein, and 973 ml free water. - free water flush to continue to be per MD/NP.   NUTRITION DIAGNOSIS:   Moderate Malnutrition related to chronic illness, cancer and cancer related treatments as evidenced by mild fat depletion, mild muscle depletion, percent weight loss. -ongoing  GOAL:   Patient will meet greater than or equal to 90% of their needs -met with TF regimen  MONITOR:   Vent status, TF tolerance, Labs, Weight trends, Skin  ASSESSMENT:   64 year-old male with a history of Hodgkin's lymphoma (stage 3, diagnosed 02/2019) on chemotherapy. He follows with Dr. Zhao in Oncology. His most recent treatment was on 3/8. He presented to the ED with worsening respiratory status and inability to tolerate PO intake recently. He had his chemo delayed this week. He has a history of neutropenic fever in the past. He had a good response to chemo on his first follow up PET scan. His appetite has been poor and he has been receiving IV fluids.  Significant Events: 3/29- admission 3/30- initial RD assessment 4/8- intubation and OGT placement 4/20- tracheostomy; OGT removed and small bore NGT placed in R nare   Patient remains intubated via trach and small bore NGT in place. He is receiving Vital AF 1.2 @ 55 ml/hr with 30 ml prostat once/day and 200 ml free water every 4 hours. This regimen is providing 1684 kcal, 114 grams protein, and 2270 ml free water. Flow sheet documentation indicates mild edema to all extremities and to face. Re-estimated nutrition needs based on today's weight (69.6 kg).   Able to talk with RN; no issues with TF at this time. RN also reminded RD of new unstageable pressure injury to sacrum. Increased  protein need related to this.  Per notes: - Palliative Care following--patient remains Full Code - progressive pneumomediastinum - bilateral pneumothoraces--chest tubes in place - mild hypernatremia and alkalosis  - ARDS - acute metabolic encephalopathy--improving   Patient is currently intubated on ventilator support MV: 10.2 L/min Temp (24hrs), Avg:99.7 F (37.6 C), Min:99 F (37.2 C), Max:100.6 F (38.1 C) Propofol: none  Labs reviewed; CBGs: 156 and 118 mg/dl, Na: 148 mmol/l, Cl: 93 mmol/l, BUN: 38 mg/dl, creatinine: 0.37 mg/dl, Ca: 8.2 mg/dl. Medications reviewed; 12.5 mg albumin x1 dose 4/26, 300 mg ferrous sulfate/day, sliding scale novolog, 6 units lantus/day, 15 ml multivitamin per NGT/day, 40 mg protonix per NGT/day. Drip; neo @ 110 mcg/min.   Diet Order:   Diet Order    None      EDUCATION NEEDS:   No education needs have been identified at this time  Skin:  Skin Assessment: Skin Integrity Issues: Skin Integrity Issues:: Stage I, Unstageable DTI: sacrum (4/15) Stage I: R heel (4/19) Unstageable: full thickness to lip (4/15) and sacrum (4/26)  Last BM:  4/27  Height:   Ht Readings from Last 1 Encounters:  07/30/19 5' 6" (1.676 m)    Weight:   Wt Readings from Last 1 Encounters:  08/06/19 69.6 kg    Ideal Body Weight:  64.5 kg  BMI:  Body mass index is 24.77 kg/m.  Estimated Nutritional Needs:   Kcal:  1847 kcal  Protein:  125-139 grams (1.8-2 grams/kg)  Fluid:  >/= 2 L/day       Jarome Matin, MS, RD, LDN, CNSC Inpatient Clinical Dietitian RD pager # available in [place]  After hours/weekend pager # available in [facility]

## 2019-08-06 NOTE — Progress Notes (Signed)
Pt went into SVT, HR up to 182. 12 lead EKG obtained & confirmed SVT. E link MD notified & lopressor 5 mg given. HR converted back to NSR-ST. Bp dropped to 75/43 MAP of 51. Neo & albumin ordered & started. Rn monitoring closely

## 2019-08-07 ENCOUNTER — Inpatient Hospital Stay (HOSPITAL_COMMUNITY): Payer: BC Managed Care – PPO

## 2019-08-07 ENCOUNTER — Other Ambulatory Visit: Payer: Self-pay | Admitting: Hematology

## 2019-08-07 DIAGNOSIS — J982 Interstitial emphysema: Secondary | ICD-10-CM

## 2019-08-07 DIAGNOSIS — J189 Pneumonia, unspecified organism: Secondary | ICD-10-CM | POA: Diagnosis not present

## 2019-08-07 LAB — CBC
HCT: 26.6 % — ABNORMAL LOW (ref 39.0–52.0)
Hemoglobin: 7.6 g/dL — ABNORMAL LOW (ref 13.0–17.0)
MCH: 31.8 pg (ref 26.0–34.0)
MCHC: 28.6 g/dL — ABNORMAL LOW (ref 30.0–36.0)
MCV: 111.3 fL — ABNORMAL HIGH (ref 80.0–100.0)
Platelets: 130 10*3/uL — ABNORMAL LOW (ref 150–400)
RBC: 2.39 MIL/uL — ABNORMAL LOW (ref 4.22–5.81)
RDW: 18.1 % — ABNORMAL HIGH (ref 11.5–15.5)
WBC: 8.3 10*3/uL (ref 4.0–10.5)
nRBC: 0 % (ref 0.0–0.2)

## 2019-08-07 LAB — BASIC METABOLIC PANEL
Anion gap: 4 — ABNORMAL LOW (ref 5–15)
BUN: 36 mg/dL — ABNORMAL HIGH (ref 8–23)
CO2: 49 mmol/L — ABNORMAL HIGH (ref 22–32)
Calcium: 8.6 mg/dL — ABNORMAL LOW (ref 8.9–10.3)
Chloride: 93 mmol/L — ABNORMAL LOW (ref 98–111)
Creatinine, Ser: 0.32 mg/dL — ABNORMAL LOW (ref 0.61–1.24)
GFR calc Af Amer: >60 mL/min (ref >60)
GFR calc non Af Amer: >60 mL/min (ref >60)
Glucose, Bld: 136 mg/dL — ABNORMAL HIGH (ref 70–99)
Potassium: 3.8 mmol/L (ref 3.5–5.1)
Sodium: 146 mmol/L — ABNORMAL HIGH (ref 135–145)

## 2019-08-07 LAB — GLUCOSE, CAPILLARY
Glucose-Capillary: 128 mg/dL — ABNORMAL HIGH (ref 70–99)
Glucose-Capillary: 113 mg/dL — ABNORMAL HIGH (ref 70–99)
Glucose-Capillary: 176 mg/dL — ABNORMAL HIGH (ref 70–99)
Glucose-Capillary: 145 mg/dL — ABNORMAL HIGH (ref 70–99)
Glucose-Capillary: 130 mg/dL — ABNORMAL HIGH (ref 70–99)
Glucose-Capillary: 132 mg/dL — ABNORMAL HIGH (ref 70–99)

## 2019-08-07 LAB — CULTURE, FUNGUS WITHOUT SMEAR

## 2019-08-07 MED ORDER — COLLAGENASE 250 UNIT/GM EX OINT
TOPICAL_OINTMENT | Freq: Every day | CUTANEOUS | Status: AC
  Filled 2019-08-07: qty 30

## 2019-08-07 NOTE — Evaluation (Signed)
SLP Cancellation Note  Patient Details Name: Steve [name] MRN: [id number] DOB: Nov 14, 1955   Cancelled treatment:       Reason Eval/Treat Not Completed: Other (comment);Patient not medically ready(note pt remains on vent, is encephalopathic when awake per notes, SLP will sign off, please reorder in future if indicated)  Kathleen Lime, MS [place]   [name] 08/07/2019, 5:47 PM

## 2019-08-07 NOTE — Consult Note (Signed)
WOC Nurse Consult Note: Reason for Consult: Unstageable pressure injury to sacrum.  Wound type:unstageable  Pressure Injury POA: NO Measurement: 5 cm x 3 cm with gray slough to woundbed Wound bed: 100% devitalized tissue Drainage (amount, consistency, odor) minimal seroanguinous  Necrotic odor. Periwound:blanchable erythema Dressing procedure/placement/frequency: Cleanse wound to sacrococcygeal area with NS and pat dry. Apply Santyl to wound bed.  Cover with NS moist gauze. Secure with silicone foam dressing. Change Santyl daily.  Replace foam every three days and PRN soilage.  PRevalon boots Patient is on low air loss mattress.  Will reassess every 7-10 days to monitor progress.  Domenic Moras MSN, RN, FNP-BC CWON Wound, Ostomy, Continence Nurse Pager [phone]

## 2019-08-07 NOTE — Progress Notes (Signed)
Steve [name]   DOB:04/24/1955   YC#:[id number]    HEME/ONC OVERVIEW: 1. Classic Hodgkin lymphoma, nodular sclerosis subtype; at least Stage III -02/2019: enlarged R supraclavicular LN's (~4 and 3cm) on neck US  ? Enlarged cervical, bulky R supraclavicular, mediastinal, and RP adenopathy ? Core R cervical LN bx non-diagnostic  ? Excisional LN bx showed classic Hodgkin lymphoma, nodular sclerosis subtype  ? FDG-avid LN's in neck, chest and abdomen; no bony abnormalities  -Late 03/2019 - 06/2019: AVD + Adcetris ? Interim PET after 2 cycles showed CR (Deauville 1)   TREATMENT SUMMARY:  04/08/2019 - 06/17/2019: AVD + Adcetris with Onpro x 3.5 cycles   ASSESSMENT & PLAN:    Stage III Classic Hodgkin lymphoma, nodular sclerosis subtype -S/p C3D1 of AVD + Adcetris -PET in early 06/2019 showed Deauville 1, consistent with CR -Unfortunately, the treatment has been on hold due to the severe persistent respiratory failure, most likely secondary to post-Covid pulmonary complications, including infection -At this time, patient is not a candidate for further chemotherapy due to the critical illnesses and poor ECOG -His prognosis from the respiratory failure is poor.  However, if he survives the hospitalization, we can consider repeating CT or PET in 3 months to monitor the disease status, depending on his clinical condition and performance status at that time   Severe persistent respiratory failure -Most likely secondary to post-Covid pulmonary complications and suspected PJP infection  -I reviewed the literature extensively on the pulmonary toxicities related to chemotherapy, including Adcetris, but the incidence of pulmonary toxicities is very small -Furthermore, the patient has received a lengthy course of high-dose steroid for the respiratory failure, which is also the treatment for any pulmonary toxicities related to chemotherapy.  However, despite discontinuation of chemotherapy and prolonged  high-dose steroid, there has not been significant improvement in the respiratory failure, further arguing against chemotherapy-related pulmonary toxicities. -I appreciate the pulmonary medicine team's expertise and dedication in caring for this patient   Goals of care discussion -Pulmonary and palliative care medicine teams have been having ongoing discussions with the patient's spouse regarding goals of care, given his overall poor prognosis -As his Hodgkin lymphoma appears to be in remission, his primary challenge is the persistent respiratory failure secondary to post-Covid complications and infection -I will try to reach out to the patient's wife and review with her the oncology plan   Thank you for caring for our mutual patient under these challenging circumstances.  Please do not hesitate to contact me if there are any questions.  Tish Men, MD   Subjective:  Patient was resting on mechanical ventilation at the time of my visit.  I discussed the case in detail with the NP Noe Gens of pulmonary medicine/critical care regarding the patient's clinical status.  He continues to have persistent pneumomediastinum and tension pneumothorax.  His sedation is being weaned, but he remains encephalopathic when he was awaken.    ROS: Unable to obtain due to persistent encephalopathy.   Objective:  Vitals:   08/07/19 0700 08/07/19 0800  BP: (!) 106/49 111/64  Pulse: 81 81  Resp: (!) 22 18  Temp: 99.3 F (37.4 C) 99.1 F (37.3 C)  SpO2: 100% 99%     Intake/Output Summary (Last 24 hours) at 08/07/2019 0813 Last data filed at 08/07/2019 0800 Gross per 24 hour  Intake 1067.19 ml  Output 1646 ml  Net -578.81 ml    GENERAL: sedated on mechanical ventilation, ill appearing  NECK: tracheostomy in place  LUNGS: limited  respiratory exam due to mechanical ventilation  HEART: RRR ABDOMEN: soft, non-distended PSYCH: sedated on ventilation    Labs:  Lab Results  Component Value Date   WBC  8.3 08/07/2019   HGB 7.6 (L) 08/07/2019   HCT 26.6 (L) 08/07/2019   MCV 111.3 (H) 08/07/2019   PLT 130 (L) 08/07/2019   NEUTROABS 5.8 08/05/2019    Lab Results  Component Value Date   NA 146 (H) 08/07/2019   K 3.8 08/07/2019   CL 93 (L) 08/07/2019   CO2 49 (H) 08/07/2019    Studies:  DG Chest 1 View  Result Date: 08/05/2019 CLINICAL DATA:  Respiratory failure. EXAM: CHEST  1 VIEW COMPARISON:  August 05, 2019 study obtained earlier in the day FINDINGS: Tracheostomy catheter tip is 6.9 cm above the carina. Port-A-Cath tip is in the superior vena cava. Feeding tube tip is below the diaphragm. There are chest tubes bilaterally. No pneumothorax is demonstrable. There is extensive subcutaneous emphysema. Suspect pneumomediastinum as well, stable. Ill-defined airspace opacity is again noted, primarily in the mid lung regions. No new opacity evident. Heart is upper normal in size with pulmonary vascularity normal. No adenopathy. No bone lesions. IMPRESSION: Extensive subcutaneous emphysema and a degree of pneumomediastinum. No pneumothorax evident. Ill-defined hazy airspace opacity in the mid lung regions bilaterally consistent with either edema or pneumonia. Both entities may be present concurrently. No new opacity. Stable cardiac silhouette. Tube and catheter positions do not appear appreciably changed from earlier in the day. Electronically Signed   By: Lowella Grip III M.D.   On: 08/05/2019 09:25   CT CHEST WO CONTRAST  Addendum Date: 08/05/2019   ADDENDUM REPORT: 08/05/2019 13:22 ADDENDUM: Study discussed by telephone with NP Noe Gens on 08/05/2019 at 1310 hours. Electronically Signed   By: Genevie Ann M.D.   On: 08/05/2019 13:22   Result Date: 08/05/2019 CLINICAL DATA:  64 year old male with COVID-19, ventilated. Bilateral chest tubes in place but worsening subcutaneous air in the face and neck. EXAM: CT CHEST WITHOUT CONTRAST TECHNIQUE: Multidetector CT imaging of the chest was performed  following the standard protocol without IV contrast. COMPARISON:  Portable chest earlier today, 08/03/2019. Chest CT 07/08/2019. FINDINGS: Cardiovascular: Large volume new pneumomediastinum. There is a degree of associated cardiac contour deformity, primarily the right heart (series 2, image 106 today versus series 4, image 63 last month). No pericardial effusion. Vascular patency is not evaluated in the absence of IV contrast. Mediastinum/Nodes: Pneumomediastinum, contiguous with the deep space neck and face gas at the thoracic inlet. No mediastinal lymphadenopathy. There are small calcified left hilar lymph nodes again noted. An enteric tube courses through the esophagus and into the visible stomach. Lungs/Pleura: Tracheostomy tube in place with no adverse features. Major airways remain patent, peripheral lung bronchiectasis is news from last month. Diffuse bilateral lung opacity ranging from ground-glass to peripheral more reticulonodular opacity, and right lower lobe consolidation. There are bilateral chest tubes in place, and the right chest tube appears to be within the pleural space anteriorly. There is a superimposed small to moderate volume right pneumothorax which is most apparent at the level of the inferior hilum and costophrenic sulcus. On the left the chest tube is partially opacified in the lung apex (series 7, image 47) but there is no left pneumothorax. Both chest tubes course anteriorly toward the lung apices. No superimposed pleural effusion. Upper Abdomen: The the scan does not extend below the entire level of the diaphragm, but no definite pneumoperitoneum is identified. However,  there is a small volume of retroperitoneal gas on the right around the IVC and kidney (series 2, image [id number]). Negative visible noncontrast liver, gallbladder, spleen, pancreas, adrenal glands, kidneys and bowel. Musculoskeletal: Large volume of subcutaneous gas in the visible bilateral lower neck, supraclavicular  regions, and wrapping posteriorly around the shoulders and upper back. No acute osseous abnormality identified. IMPRESSION: 1. Tension Pneumomediastinum: large new volume of pneumomediastinum since a chest CT 07/08/2019, deforming the right heart contour. No pericardial effusion Mediastinal gas appears contiguous with extensive subcutaneous emphysema in the visible neck, and also with a small volume of retroperitoneal gas in the right upper abdomen. 2. Bilateral chest tubes in place course into the lung apices. Superimposed small to moderate volume of right pneumothorax, most apparent along the medial lung and in the costophrenic angle. No pleural effusion. 3. Continued widespread pulmonary opacity with development of some bronchiectasis since March, but mildly improved ventilation overall. There is some consolidation in the right lower lobe. Electronically Signed: By: Genevie Ann M.D. On: 08/05/2019 12:47   DG Chest Port 1 View  Result Date: 08/06/2019 CLINICAL DATA:  Pneumomediastinum. EXAM: PORTABLE CHEST 1 VIEW COMPARISON:  Chest x-rays dated 08/05/2019. Chest CT dated 08/05/2019. FINDINGS: Heart size is grossly stable. Tracheostomy tube in place. Bilateral chest tubes appear stable in position. Enteric tube passes below the diaphragm. LEFT-sided Port-A-Cath appears stable in position with tip at the level of the RIGHT atrium. Persistent extensive subcutaneous emphysema, associated with the pneumomediastinum better demonstrated on earlier chest CT. Diffuse airspace opacities are unchanged in the short-term interval. Probable small RIGHT-sided pneumothorax. IMPRESSION: 1. Stable chest x-ray. Persistent extensive subcutaneous emphysema, associated with the pneumomediastinum better demonstrated on earlier chest CT. 2. Stable bilateral airspace opacities, likely multifocal pneumonia. 3. Probable small RIGHT-sided pneumothorax, better demonstrated on earlier chest CT. Bilateral chest tubes in place. Electronically  Signed   By: Franki Cabot M.D.   On: 08/06/2019 09:05

## 2019-08-07 NOTE — Progress Notes (Addendum)
NAME:  Steve [name], MRN:  [id number], DOB:  [birth year], LOS: 33 ADMISSION DATE:  07/08/2019, CONSULTATION DATE:  3/29 REFERRING MD:  [name], CHIEF COMPLAINT:  dyspnea   Brief History   64 y/o male, with prior COVID infection 05/2019, admitted with severe acute respiratory failure with hypoxemia in the setting of receiving chemotherapy for Hodgkin's lymphoma.  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 ARDS. Bilateral PTX s/p CTx2 4/19 Remains on vent 60%/PEEP 8, likely to need trach  4/20 trach placed 4/26 Worsening SQ air, CT with tension pneumomediastinum, chest tube suction increased  Consults:  ID >> s/o 4/09 Oncology >> s/o 4/05  Procedures:  4/8 ETT > 4/20 4/20 Trach >> 4/9 bilateral chest tubes  Significant Diagnostic Tests:   CTA chest 3/29 > no PE, diffuse GGO upper lobes, patchy consolidation in bases, peripheral fibrotic changes noted on prior CT chest still present  ECHO 3/30 >LVEF 60 to 29%, grade 1 diastolic dysfunction. Mildly dilated LA, normal RV. Trivial MR and TR,otherwise normal valves  CT Intraperitoneal air, likely extension of the pneumomediastinum/pneumothorax. An intra-abdominal etiology such as bowel perforation is favored less likely.Extensive pneumomediastinum and partially visualized right pneumothorax. Diarrheal state. Correlation with clinical exam and stool cultures recommended. No bowel obstruction. Normal appendix. Sigmoid diverticulosis  CT head 4/22 > NAICP, sub cutaneous air in neck, bilateral mastoid effusions CT Chest w/o 4/26 >> tension pneumomediastinum, bilateral chest tubes in place, continued widespread pulmonary opacity with development of  some bronchiectasis  Micro Data:  UC 3/29 >>negative  BCx2 3/29 >>negative  PJP 4/2 >>negative  Fungitel 3/29 >> positive Blastomyces Antigen 3/20 >> negative Respiratory Culture 4/4 >> staph epidermis >> S-vanco, clinda BAL 4/8 >>candida glabrata Fungal (BAL) 4/8 >> Candida glabrata PJP (BAL) 4/8 >>negative  Aspergillus antigen (BAL) 4/8 >> Negative  Antimicrobials:  Cefepime 3/29 >>3/31 Vanc 3/29 >>3/30 Azithromycin 3/29 >> 4/1 Fluconazole 3/29 >> 4/13 Meropenem 3/31 >> 4/6 Bactrim 3/31 >> 4/4  Clindamycin 4/5 >>4/20 Primaquine 4/5 >>4/20 Anidulafungin 4/12 >> 4/20  Bactrim (PCP prophylaxis)   Interim history/subjective:  Pt remains on 31mg's neosynephrine  Tmax 99.1   FiO2 40%, PEEP 5 I/O UOP 1.1L, -7458min last 24 hours  Objective   Blood pressure 114/64, pulse 98, temperature 99.1 F (37.3 C), temperature source Bladder, resp. rate (!) 30, height _0  (1.676 m), weight 66.7 kg, SpO2 90 %.    Vent Mode: PRVC FiO2 (%):  [40 %] 40 % Set Rate:  [26 bmp] 26 bmp Vt Set:  [380 mL] 380 mL PEEP:  [5 cmH20] 5 cmH20 Pressure Support:  [15 cmH20] 15 cmH20 Plateau Pressure:  [13 cmH20-27 cmH20] 20 cmH20   Intake/Output Summary (Last 24 hours) at 08/07/2019 1313 Last data filed at 08/07/2019 1200 Gross per 24 hour  Intake 1431.28 ml  Output 1796 ml  Net -364.72 ml   Filed Weights   08/03/19 1353 08/06/19 0500 08/07/19 0500  Weight: 66.3 kg 69.6 kg 66.7 kg    Examination: General: chronically critically ill appearing male lying in bed in NAD HEENT: MM pink/dry, trach midline clean/dry/intact Neuro: awakens to voice, nods yes/no CV: s1s2 rrr, no m/r/g PULM:  Non-labored on vent, lungs bilaterally coarse, bilateral chest tubes  GI:  soft, bsx4 active  Extremities: warm/dry, trace edema  Skin: no rashes or lesions  CXR 4/28 >> images personally reviewed, trach in good position, no pneumothorax, bilateral chest tubes in place, unchanged bilateral  airspace opacities   Resolved Hospital Problem list   Candida glabrata in BAL PJP pneumonia, suspected, treated for 21 days  Assessment & Plan:   ARDS   ? Central apnea  -PRVC 8cc/kg as rest mode -continue daily PSV trials -trach care per protocol  -clip trach sutures   -VAP prevention measures  -continue bactrim for PCP prophylaxis   Spontaneous pneumothoraces Pneumomediastinum Sub cutaneous emphysema, peritoneal free air -chest tubes to 40 cm suction > consider reduction of suction 4/29 -follow daily CXR while CT's in place  -chest tube care per protocol   Acute metabolic encephalopathy , improving Need for sedation for mechanical ventilation Fentanyl tachyphylaxis At risk for encephalopathy due to hypercarbia -RASS Goal 0 to -1  -PRN dilaudid for pain  -delirium prevention measures   Hypernatremia -follow BMP  -continue free water 200 ml Q4    Anemia of chronic illness Hodgkin lymphoma -follow CBC  -transfuse for Hgb <7%  Hyperlipidemia -statin as ordered   Severe protein calorie malnutrition -TF per nutrition   Physical deconditioning -passive ROM / PT efforts   Sacral Decubitus  -WOC consult, appreciate input   Goals of care -appreciate palliative care discussion with family, family wishes for full scope care  Best practice:  Diet: tube feeding Pain/Anxiety/Delirium protocol (if indicated): as above VAP protocol (if indicated): yes DVT prophylaxis: lovenox GI prophylaxis: PPI Glucose control: SSI Mobility: bed rest Code Status: full Family Communication: Wife updated 4/28 via phone  Disposition: ICU  Labs   CBC: Recent Labs  Lab 08/03/19 0230 08/05/19 0347 08/06/19 0412 08/07/19 0439  WBC 10.2 8.3 8.5 8.3  NEUTROABS 7.6 5.8  --   --   HGB 8.5* 8.3* 7.6* 7.6*  HCT 30.0* 29.5* 27.0* 26.6*  MCV 108.7* 111.3* 110.7* 111.3*  PLT 121* 114* 113* 130*    Basic Metabolic Panel: Recent Labs  Lab 08/01/19 0401 08/01/19 0401  08/02/19 0326 08/02/19 0326 08/03/19 0230 08/03/19 0230 08/04/19 0315 08/05/19 0347 08/05/19 2115 08/06/19 0412 08/07/19 0439  NA 144   < > 147*   < > 148*   < > 150* 152* 150* 148* 146*  K 3.7   < > 3.7   < > 3.5   < > 3.2* 4.1 3.8 3.6 3.8  CL 84*   < > 85*   < > 84*   < > 86* 90* 90* 93* 93*  CO2 50*   < > >50*   < > >50*   < > 53* >50* >50* 47* 49*  GLUCOSE 145*   < > 109*   < > 188*   < > 157* 187* 133* 181* 136*  BUN 37*   < > 35*   < > 44*   < > 43* 48* 48* 38* 36*  CREATININE 0.42*   < > 0.39*   < > 0.51*   < > 0.52* 0.53* 0.49* 0.37* 0.32*  CALCIUM 8.5*   < > 8.5*   < > 8.6*   < > 8.4* 8.7* 8.7* 8.2* 8.6*  MG 2.0  --  2.0  --  2.1  --   --   --  2.2  --   --   PHOS 3.1  --  2.2*  --  3.2  --   --   --   --   --   --    < > =  values in this interval not displayed.   GFR: Estimated Creatinine Clearance: 85.3 mL/min (A) (by C-G formula based on SCr of 0.32 mg/dL (L)). Recent Labs  Lab 08/03/19 0230 08/05/19 0347 08/06/19 0412 08/07/19 0439  WBC 10.2 8.3 8.5 8.3    Liver Function Tests: Recent Labs  Lab 08/03/19 0230  AST 33  ALT 40  ALKPHOS 105  BILITOT 0.6  PROT 6.4*  ALBUMIN 2.5*   No results for input(s): LIPASE, AMYLASE in the last 168 hours. No results for input(s): AMMONIA in the last 168 hours.  ABG    Component Value Date/Time   PHART 7.393 07/28/2019 2359   PCO2ART 87.5 (HH) 07/28/2019 2359   PO2ART 64.7 (L) 07/28/2019 2359   HCO3 52.1 (H) 07/28/2019 2359   TCO2 28 07/08/2019 1331   O2SAT 91.8 07/28/2019 2359     Coagulation Profile: No results for input(s): INR, PROTIME in the last 168 hours.  Cardiac Enzymes: No results for input(s): CKTOTAL, CKMB, CKMBINDEX, TROPONINI in the last 168 hours.  HbA1C: Hgb A1c MFr Bld  Date/Time Value Ref Range Status  07/09/2019 05:10 AM 7.3 (H) 4.8 - 5.6 % Final    Comment:    (NOTE) Pre diabetes:          5.7%-6.4% Diabetes:              >6.4% Glycemic control for   <7.0% adults with diabetes    05/19/2019 03:56 AM 8.3 (H) 4.8 - 5.6 % Final    Comment:    (NOTE) Pre diabetes:          5.7%-6.4% Diabetes:              >6.4% Glycemic control for   <7.0% adults with diabetes     CBG: Recent Labs  Lab 08/06/19 1944 08/06/19 2329 08/07/19 0429 08/07/19 0755 08/07/19 1215  GLUCAP 132* 140* 128* 113* 176*    CC Time: 30 minutes   Noe Gens, MSN, NP-C [facility] 08/07/2019, 1:13 PM   Please see [email or web address] for pager details.

## 2019-08-07 NOTE — TOC Progression Note (Signed)
Transition of Care [facility]) - Progression Note    Patient Details  Name: Steve [name] MRN: [id number] Date of Birth: [birth year]  [facility] of [facility]) CM/SW Contact  Leeroy Cha, RN Phone Number: 08/07/2019, 9:49 AM  Clinical Narrative:   Message sent to both area ltach's to review case.         Expected Discharge Plan and Services                                                 Social Determinants of Health (SDOH) Interventions    Readmission Risk Interventions Readmission Risk Prevention Plan 07/22/2019  Transportation Screening Complete  Medication Review [employer] photographer) Complete  Some recent data might be hidden

## 2019-08-08 ENCOUNTER — Inpatient Hospital Stay (HOSPITAL_COMMUNITY): Payer: BC Managed Care – PPO

## 2019-08-08 DIAGNOSIS — Z930 Tracheostomy status: Secondary | ICD-10-CM

## 2019-08-08 DIAGNOSIS — J189 Pneumonia, unspecified organism: Secondary | ICD-10-CM | POA: Diagnosis not present

## 2019-08-08 LAB — GLUCOSE, CAPILLARY
Glucose-Capillary: 118 mg/dL — ABNORMAL HIGH (ref 70–99)
Glucose-Capillary: 124 mg/dL — ABNORMAL HIGH (ref 70–99)
Glucose-Capillary: 124 mg/dL — ABNORMAL HIGH (ref 70–99)
Glucose-Capillary: 129 mg/dL — ABNORMAL HIGH (ref 70–99)
Glucose-Capillary: 143 mg/dL — ABNORMAL HIGH (ref 70–99)
Glucose-Capillary: 155 mg/dL — ABNORMAL HIGH (ref 70–99)

## 2019-08-08 LAB — CBC
HCT: 27.4 % — ABNORMAL LOW (ref 39.0–52.0)
Hemoglobin: 7.6 g/dL — ABNORMAL LOW (ref 13.0–17.0)
MCH: 31 pg (ref 26.0–34.0)
MCHC: 27.7 g/dL — ABNORMAL LOW (ref 30.0–36.0)
MCV: 111.8 fL — ABNORMAL HIGH (ref 80.0–100.0)
Platelets: 135 10*3/uL — ABNORMAL LOW (ref 150–400)
RBC: 2.45 MIL/uL — ABNORMAL LOW (ref 4.22–5.81)
RDW: 18.2 % — ABNORMAL HIGH (ref 11.5–15.5)
WBC: 8.5 10*3/uL (ref 4.0–10.5)
nRBC: 0 % (ref 0.0–0.2)

## 2019-08-08 LAB — BASIC METABOLIC PANEL
Anion gap: 6 (ref 5–15)
BUN: 36 mg/dL — ABNORMAL HIGH (ref 8–23)
CO2: 48 mmol/L — ABNORMAL HIGH (ref 22–32)
Calcium: 8.5 mg/dL — ABNORMAL LOW (ref 8.9–10.3)
Chloride: 93 mmol/L — ABNORMAL LOW (ref 98–111)
Creatinine, Ser: 0.37 mg/dL — ABNORMAL LOW (ref 0.61–1.24)
GFR calc Af Amer: >60 mL/min (ref >60)
GFR calc non Af Amer: >60 mL/min (ref >60)
Glucose, Bld: 148 mg/dL — ABNORMAL HIGH (ref 70–99)
Potassium: 4 mmol/L (ref 3.5–5.1)
Sodium: 147 mmol/L — ABNORMAL HIGH (ref 135–145)

## 2019-08-08 NOTE — Progress Notes (Addendum)
NAME:  Steve [name], MRN:  [id number], DOB:  08-01-1955, LOS: 69 ADMISSION DATE:  07/08/2019, CONSULTATION DATE:  3/29 REFERRING MD:  [name], CHIEF COMPLAINT:  dyspnea   Brief History   64 y/o male, with prior COVID infection 05/2019, admitted with severe acute respiratory failure with hypoxemia in the setting of receiving chemotherapy for Hodgkin's lymphoma.  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 ARDS. Bilateral PTX s/p CTx2 4/19 Remains on vent 60%/PEEP 8, likely to need trach  4/20 trach placed 4/26 Worsening SQ air, CT with tension pneumomediastinum, chest tube suction increased 4/29 On neo 51mg's  Consults:  ID >> s/o 4/09 Oncology >> s/o 4/05  Procedures:  4/8 ETT > 4/20 4/20 Trach >> 4/9 bilateral chest tubes  Significant Diagnostic Tests:   CTA chest 3/29 > no PE, diffuse GGO upper lobes, patchy consolidation in bases, peripheral fibrotic changes noted on prior CT chest still present  ECHO 3/30 >LVEF 60 to 660% grade 1 diastolic dysfunction. Mildly dilated LA, normal RV. Trivial MR and TR,otherwise normal valves  CT Intraperitoneal air, likely extension of the pneumomediastinum/pneumothorax. An intra-abdominal etiology such as bowel perforation is favored less likely.Extensive pneumomediastinum and partially visualized right pneumothorax. Diarrheal state. Correlation with clinical exam and stool cultures recommended. No bowel obstruction. Normal appendix. Sigmoid diverticulosis  CT head 4/22 > NAICP, sub cutaneous air in neck, bilateral mastoid effusions CT Chest w/o 4/26 >> tension pneumomediastinum, bilateral chest tubes in place, continued widespread pulmonary opacity  with development of some bronchiectasis  Micro Data:  UC 3/29 >>negative  BCx2 3/29 >>negative  PJP 4/2 >>negative  Fungitel 3/29 >> positive Blastomyces Antigen 3/20 >> negative Respiratory Culture 4/4 >> staph epidermis >> S-vanco, clinda BAL 4/8 >>candida glabrata Fungal (BAL) 4/8 >> Candida glabrata PJP (BAL) 4/8 >>negative  Aspergillus antigen (BAL) 4/8 >> Negative  Antimicrobials:  Cefepime 3/29 >>3/31 Vanc 3/29 >>3/30 Azithromycin 3/29 >> 4/1 Fluconazole 3/29 >> 4/13 Meropenem 3/31 >> 4/6 Bactrim 3/31 >> 4/4  Clindamycin 4/5 >>4/20 Primaquine 4/5 >>4/20 Anidulafungin 4/12 >> 4/20  Bactrim (PCP prophylaxis)   Interim history/subjective:  No change in neosynephrine dosing, 90 mcg's  Tmax 100.4 / WBC 8.5 Glucose range 124-148 I/O - 1.1L UOP, 300 stool, -2458mfor 24 hours   Objective   Blood pressure 112/62, pulse 100, temperature (!) 100.4 F (38 C), resp. rate (!) 29, height _0  (1.676 m), weight 69.3 kg, SpO2 96 %.    Vent Mode: PRVC FiO2 (%):  [40 %] 40 % Set Rate:  [26 bmp] 26 bmp Vt Set:  [380 mL] 380 mL PEEP:  [5 cmH20] 5 cmH20 Plateau Pressure:  [20 cmH20-29 cmH20] 25 cmH20   Intake/Output Summary (Last 24 hours) at 08/08/2019 1116 Last data filed at 08/08/2019 0845 Gross per 24 hour  Intake 779.04 ml  Output 1605 ml  Net -825.96 ml   Filed Weights   08/06/19 0500 08/07/19 0500 08/08/19 0352  Weight: 69.6 kg 66.7 kg 69.3 kg    Examination: General: chronically critically ill appearing adult male lying in bed on vent HEENT: MM pink/dry, midline trach c/d/i Neuro: awakens to voice / opens eyes, nods yes/no, generalized weakness CV: s1s2 rrr, no m/r/g PULM: non-labored on vent, lungs bilaterally coarse,  bilateral chest tubes in place with suction to 40 cm GI: soft, bsx4 active  Extremities: warm/dry, bilateral UE dependent edema, trace in LE Skin: no rashes or lesions on exposed skin, sacral ulcer per RN  CXR 4/29 >> images  personally reviewed, trach / chest tubes in good position, residual right apical PTX, sub-q emphysema, diffuse bilateral opacities unchanged  Resolved Hospital Problem list   Candida glabrata in BAL PJP pneumonia, suspected, treated for 21 days  Assessment & Plan:   ARDS   ? Central apnea  -PRVC as rest mode -PSV trial as tolerated, suspect his global weakness will make his weaning process difficult  -trach care per protocol  -VAP prevention measures  -bactrim for PCP prophylaxis  -will ask TOC team to assess / start process for LTAC  Spontaneous pneumothoraces Tension Pneumomediastinum Sub cutaneous emphysema, peritoneal free air -chest tubes to 40 cm suction > consider reduction of suction 4/29 -follow daily CXR while CT's in place  -chest tube care per protocol   Acute metabolic encephalopathy , improving Need for sedation for mechanical ventilation Fentanyl tachyphylaxis At risk for encephalopathy due to hypercarbia -RASS Goal 0 to -1  -PRN dilaudid for pain  -delirium prevention measures   Hypernatremia -trend BMP  -continue free water 200 ml Q4  Anemia of chronic illness Mild Thrombocytopenia  Hodgkin lymphoma -follow CBC -transfuse for Hgb <7% -monitor for bleeding   Hyperlipidemia -continue statin   Severe protein calorie malnutrition -TF per nutrition   Physical deconditioning -passive ROM / PT efforts   Sacral Decubitus  -appreciate WOC   Goals of care -appreciate prior discussion with palliative care, family wishes for full scope.  They have young children (8 & 1 y/o)  Best practice:  Diet: tube feeding Pain/Anxiety/Delirium protocol (if indicated): as above VAP protocol (if indicated): yes DVT prophylaxis: lovenox GI prophylaxis: PPI Glucose control: SSI Mobility: bed rest Code Status: full Family Communication: Will update wife on arrival 4/29  Disposition: ICU  Labs   CBC: Recent Labs  Lab 08/03/19 0230 08/05/19 0347  08/06/19 0412 08/07/19 0439 08/08/19 0340  WBC 10.2 8.3 8.5 8.3 8.5  NEUTROABS 7.6 5.8  --   --   --   HGB 8.5* 8.3* 7.6* 7.6* 7.6*  HCT 30.0* 29.5* 27.0* 26.6* 27.4*  MCV 108.7* 111.3* 110.7* 111.3* 111.8*  PLT 121* 114* 113* 130* 135*    Basic Metabolic Panel: Recent Labs  Lab 08/02/19 0326 08/02/19 0326 08/03/19 0230 08/04/19 0315 08/05/19 0347 08/05/19 2115 08/06/19 0412 08/07/19 0439 08/08/19 0340  NA 147*   < > 148*   < > 152* 150* 148* 146* 147*  K 3.7   < > 3.5   < > 4.1 3.8 3.6 3.8 4.0  CL 85*   < > 84*   < > 90* 90* 93* 93* 93*  CO2 >50*   < > >50*   < > >50* >50* 47* 49* 48*  GLUCOSE 109*   < > 188*   < > 187* 133* 181* 136* 148*  BUN 35*   < > 44*   < > 48* 48* 38* 36* 36*  CREATININE 0.39*   < > 0.51*   < > 0.53* 0.49* 0.37* 0.32* 0.37*  CALCIUM 8.5*   < > 8.6*   < > 8.7* 8.7* 8.2* 8.6* 8.5*  MG 2.0  --  2.1  --   --  2.2  --   --   --   PHOS 2.2*  --  3.2  --   --   --   --   --   --    < > =  values in this interval not displayed.   GFR: Estimated Creatinine Clearance: 85.3 mL/min (A) (by C-G formula based on SCr of 0.37 mg/dL (L)). Recent Labs  Lab 08/05/19 0347 08/06/19 0412 08/07/19 0439 08/08/19 0340  WBC 8.3 8.5 8.3 8.5    Liver Function Tests: Recent Labs  Lab 08/03/19 0230  AST 33  ALT 40  ALKPHOS 105  BILITOT 0.6  PROT 6.4*  ALBUMIN 2.5*   No results for input(s): LIPASE, AMYLASE in the last 168 hours. No results for input(s): AMMONIA in the last 168 hours.  ABG    Component Value Date/Time   PHART 7.393 07/28/2019 2359   PCO2ART 87.5 (HH) 07/28/2019 2359   PO2ART 64.7 (L) 07/28/2019 2359   HCO3 52.1 (H) 07/28/2019 2359   TCO2 28 07/08/2019 1331   O2SAT 91.8 07/28/2019 2359     Coagulation Profile: No results for input(s): INR, PROTIME in the last 168 hours.  Cardiac Enzymes: No results for input(s): CKTOTAL, CKMB, CKMBINDEX, TROPONINI in the last 168 hours.  HbA1C: Hgb A1c MFr Bld  Date/Time Value Ref Range Status   07/09/2019 05:10 AM 7.3 (H) 4.8 - 5.6 % Final    Comment:    (NOTE) Pre diabetes:          5.7%-6.4% Diabetes:              >6.4% Glycemic control for   <7.0% adults with diabetes   05/19/2019 03:56 AM 8.3 (H) 4.8 - 5.6 % Final    Comment:    (NOTE) Pre diabetes:          5.7%-6.4% Diabetes:              >6.4% Glycemic control for   <7.0% adults with diabetes     CBG: Recent Labs  Lab 08/07/19 1546 08/07/19 2013 08/07/19 2324 08/08/19 0318 08/08/19 0803  GLUCAP 145* 130* 132* 143* 124*    CC Time: 30 minutes   Noe Gens, MSN, NP-C [facility] 08/08/2019, 11:16 AM   Please see [email or web address] for pager details.

## 2019-08-08 NOTE — Progress Notes (Signed)
Patient's wife called and was updated on plan of care. All questions were answered. RN will continue to monitor.

## 2019-08-09 ENCOUNTER — Inpatient Hospital Stay (HOSPITAL_COMMUNITY): Payer: BC Managed Care – PPO

## 2019-08-09 DIAGNOSIS — J189 Pneumonia, unspecified organism: Secondary | ICD-10-CM | POA: Diagnosis not present

## 2019-08-09 DIAGNOSIS — J9601 Acute respiratory failure with hypoxia: Secondary | ICD-10-CM | POA: Diagnosis not present

## 2019-08-09 DIAGNOSIS — T797XXA Traumatic subcutaneous emphysema, initial encounter: Secondary | ICD-10-CM

## 2019-08-09 DIAGNOSIS — J80 Acute respiratory distress syndrome: Secondary | ICD-10-CM | POA: Diagnosis not present

## 2019-08-09 LAB — BASIC METABOLIC PANEL
Anion gap: 3 — ABNORMAL LOW (ref 5–15)
BUN: 32 mg/dL — ABNORMAL HIGH (ref 8–23)
CO2: 49 mmol/L — ABNORMAL HIGH (ref 22–32)
Calcium: 8.5 mg/dL — ABNORMAL LOW (ref 8.9–10.3)
Chloride: 92 mmol/L — ABNORMAL LOW (ref 98–111)
Creatinine, Ser: <0.3 mg/dL — ABNORMAL LOW (ref 0.61–1.24)
Glucose, Bld: 142 mg/dL — ABNORMAL HIGH (ref 70–99)
Potassium: 3.7 mmol/L (ref 3.5–5.1)
Sodium: 144 mmol/L (ref 135–145)

## 2019-08-09 LAB — CBC
HCT: 26.9 % — ABNORMAL LOW (ref 39.0–52.0)
Hemoglobin: 7.7 g/dL — ABNORMAL LOW (ref 13.0–17.0)
MCH: 32.1 pg (ref 26.0–34.0)
MCHC: 28.6 g/dL — ABNORMAL LOW (ref 30.0–36.0)
MCV: 112.1 fL — ABNORMAL HIGH (ref 80.0–100.0)
Platelets: 137 10*3/uL — ABNORMAL LOW (ref 150–400)
RBC: 2.4 MIL/uL — ABNORMAL LOW (ref 4.22–5.81)
RDW: 17.7 % — ABNORMAL HIGH (ref 11.5–15.5)
WBC: 8 10*3/uL (ref 4.0–10.5)
nRBC: 0 % (ref 0.0–0.2)

## 2019-08-09 LAB — GLUCOSE, CAPILLARY
Glucose-Capillary: 162 mg/dL — ABNORMAL HIGH (ref 70–99)
Glucose-Capillary: 113 mg/dL — ABNORMAL HIGH (ref 70–99)
Glucose-Capillary: 170 mg/dL — ABNORMAL HIGH (ref 70–99)
Glucose-Capillary: 110 mg/dL — ABNORMAL HIGH (ref 70–99)

## 2019-08-09 NOTE — Progress Notes (Signed)
NAME:  Steve [name], MRN:  [id number], DOB:  Oct 13, 1955, LOS: 53 ADMISSION DATE:  07/08/2019, CONSULTATION DATE:  3/29 REFERRING MD:  [name], CHIEF COMPLAINT:  dyspnea   Brief History   64 y/o male, with prior COVID infection 05/2019, admitted with severe acute respiratory failure with hypoxemia in the setting of receiving chemotherapy for Hodgkin's lymphoma. ICU course prolonged due to ARDS, malnutrition.    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 ARDS. Bilateral PTX s/p CTx2 4/19 Remains on vent 60%/PEEP 8, likely to need trach  4/20 trach placed 4/26 Worsening SQ air, CT with tension pneumomediastinum, chest tube suction increased 4/29 On neo 21mg's 4/30 Weaning neo  Consults:  ID >> s/o 4/09 Oncology >> s/o 4/05  Procedures:  4/8 ETT > 4/20 4/20 Trach >> 4/9 bilateral chest tubes  Significant Diagnostic Tests:   CTA chest 3/29 > no PE, diffuse GGO upper lobes, patchy consolidation in bases, peripheral fibrotic changes noted on prior CT chest still present  ECHO 3/30 >LVEF 60 to 636% grade 1 diastolic dysfunction. Mildly dilated LA, normal RV. Trivial MR and TR,otherwise normal valves  CT Intraperitoneal air, likely extension of the pneumomediastinum/pneumothorax. An intra-abdominal etiology such as bowel perforation is favored less likely.Extensive pneumomediastinum and partially visualized right pneumothorax. Diarrheal state. Correlation with clinical exam and stool cultures recommended. No bowel obstruction. Normal appendix. Sigmoid diverticulosis  CT head 4/22 > NAICP, sub cutaneous air in neck, bilateral mastoid effusions CT Chest w/o 4/26 >> tension pneumomediastinum,  bilateral chest tubes in place, continued widespread pulmonary opacity with development of some bronchiectasis  Micro Data:  UC 3/29 >>negative  BCx2 3/29 >>negative  PJP 4/2 >>negative  Fungitel 3/29 >> positive Blastomyces Antigen 3/20 >> negative Respiratory Culture 4/4 >> staph epidermis >> S-vanco, clinda BAL 4/8 >>candida glabrata Fungal (BAL) 4/8 >> Candida glabrata PJP (BAL) 4/8 >>negative  Aspergillus antigen (BAL) 4/8 >> Negative Tracheal aspirate 4/29 >>  Antimicrobials:  Cefepime 3/29 >>3/31 Vanc 3/29 >>3/30 Azithromycin 3/29 >> 4/1 Fluconazole 3/29 >> 4/13 Meropenem 3/31 >> 4/6 Bactrim 3/31 >> 4/4  Clindamycin 4/5 >>4/20 Primaquine 4/5 >>4/20 Anidulafungin 4/12 >> 4/20  Bactrim (PCP prophylaxis)   Interim history/subjective:  Remains on 923m neosynephrine  On CPAP 15/5  Glucose range 113-162 Tmax 101.3, currently 99.9 / WBC 8 (flat trend) I/O 1.5L UOP, -1.4L in 24 hours RN reports pt more alert in am, fatigues as they day goes on  Objective   Blood pressure (!) 104/57, pulse 86, temperature 99.9 F (37.7 C), resp. rate (!) 24, height _0  (1.676 m), weight 70.6 kg, SpO2 97 %.    Vent Mode: CPAP;PSV FiO2 (%):  [40 %-45 %] 40 % Set Rate:  [26 bmp] 26 bmp Vt Set:  [380 mL] 380 mL PEEP:  [5 cmH20] 5 cmH20 Pressure Support:  [15 cmH20] 15 cmH20 Plateau Pressure:  [18 cmH20-25 cmH20] 25 cmH20   Intake/Output Summary (Last 24 hours) at 08/09/2019 0847 Last data filed at 08/09/2019 0725 Gross per 24 hour  Intake 310.05 ml  Output 1595 ml  Net -1284.95 ml   Filed Weights   08/07/19 0500 08/08/19 0352 08/09/19 0500  Weight: 66.7 kg 69.3 kg 70.6 kg    Examination: General: chronically critically  ill appearing adult male lying in bed on vent HEENT: MM pink/dry, trach midline c/d/i, temporal wasting  Neuro: awakens to voice, nods, squeezes wife's hand CV: s1s2 RRR, no m/r/g PULM:  Tachypnea but non-labored on vent, lungs bilaterally coarse    GI: soft, bsx4 active  Extremities: warm/dry, trace generalized UE edema  Skin: no rashes or lesions on exposed skin  Resolved Hospital Problem list   Candida glabrata in BAL PJP pneumonia, suspected, treated for 21 days  Assessment & Plan:   ARDS   ? Central apnea  -PRVC 8cc/kg as rest mode  -daily PSV trials, his global weakness has been a barrier to weaning  -trach care per protocol  -VAP prevention measures -continue bactrim for PCP prophylaxis  -TOC working on process for LTAC placement   Spontaneous pneumothoraces Tension Pneumomediastinum Sub cutaneous emphysema, peritoneal free air -continue chest tubes to 40 cm suction -follow daily CXR while CT's in place -chest tube care per protocol  Acute metabolic encephalopathy , improving Need for sedation for mechanical ventilation Fentanyl tachyphylaxis At risk for encephalopathy due to hypercarbia -RASS Goal 0 to -1  -PRN dilaudid for pain  -delirium prevention measures   Hypernatremia -trend BMP  -continue free water 200 ml Q4  Anemia of chronic illness Mild Thrombocytopenia  Hodgkin lymphoma -follow CBC -transfuse for Hgb <7%  Hyperlipidemia -continue statin   Severe protein calorie malnutrition -TF per Nutrition   Physical deconditioning -PT efforts   Sacral Decubitus  -WOC recommendations -frequent turning   Goals of care -appreciate prior discussion with palliative care, family wishes for full scope.  They have young children (56 & 18 y/o)  Best practice:  Diet: tube feeding Pain/Anxiety/Delirium protocol (if indicated): as above VAP protocol (if indicated): yes DVT prophylaxis: lovenox GI prophylaxis: PPI Glucose control: SSI Mobility: bed rest Code Status: full Family Communication: Wife updated at bedside 4/30 am Disposition: ICU  Labs   CBC: Recent Labs  Lab 08/03/19 0230 08/03/19 0230 08/05/19 0347 08/06/19 0412 08/07/19 0439 08/08/19 0340 08/09/19 0534  WBC 10.2   < > 8.3  8.5 8.3 8.5 8.0  NEUTROABS 7.6  --  5.8  --   --   --   --   HGB 8.5*   < > 8.3* 7.6* 7.6* 7.6* 7.7*  HCT 30.0*   < > 29.5* 27.0* 26.6* 27.4* 26.9*  MCV 108.7*   < > 111.3* 110.7* 111.3* 111.8* 112.1*  PLT 121*   < > 114* 113* 130* 135* 137*   < > = values in this interval not displayed.    Basic Metabolic Panel: Recent Labs  Lab 08/03/19 0230 08/04/19 0315 08/05/19 2115 08/06/19 0412 08/07/19 0439 08/08/19 0340 08/09/19 0534  NA 148*   < > 150* 148* 146* 147* 144  K 3.5   < > 3.8 3.6 3.8 4.0 3.7  CL 84*   < > 90* 93* 93* 93* 92*  CO2 >50*   < > >50* 47* 49* 48* 49*  GLUCOSE 188*   < > 133* 181* 136* 148* 142*  BUN 44*   < > 48* 38* 36* 36* 32*  CREATININE 0.51*   < > 0.49* 0.37* 0.32* 0.37* <0.30*  CALCIUM 8.6*   < > 8.7* 8.2* 8.6* 8.5* 8.5*  MG 2.1  --  2.2  --   --   --   --   PHOS 3.2  --   --   --   --   --   --    < > =  values in this interval not displayed.   GFR: CrCl cannot be calculated (This lab value cannot be used to calculate CrCl because it is not a number: <0.30). Recent Labs  Lab 08/06/19 0412 08/07/19 0439 08/08/19 0340 08/09/19 0534  WBC 8.5 8.3 8.5 8.0    Liver Function Tests: Recent Labs  Lab 08/03/19 0230  AST 33  ALT 40  ALKPHOS 105  BILITOT 0.6  PROT 6.4*  ALBUMIN 2.5*   No results for input(s): LIPASE, AMYLASE in the last 168 hours. No results for input(s): AMMONIA in the last 168 hours.  ABG    Component Value Date/Time   PHART 7.393 07/28/2019 2359   PCO2ART 87.5 (HH) 07/28/2019 2359   PO2ART 64.7 (L) 07/28/2019 2359   HCO3 52.1 (H) 07/28/2019 2359   TCO2 28 07/08/2019 1331   O2SAT 91.8 07/28/2019 2359     Coagulation Profile: No results for input(s): INR, PROTIME in the last 168 hours.  Cardiac Enzymes: No results for input(s): CKTOTAL, CKMB, CKMBINDEX, TROPONINI in the last 168 hours.  HbA1C: Hgb A1c MFr Bld  Date/Time Value Ref Range Status  07/09/2019 05:10 AM 7.3 (H) 4.8 - 5.6 % Final    Comment:     (NOTE) Pre diabetes:          5.7%-6.4% Diabetes:              >6.4% Glycemic control for   <7.0% adults with diabetes   05/19/2019 03:56 AM 8.3 (H) 4.8 - 5.6 % Final    Comment:    (NOTE) Pre diabetes:          5.7%-6.4% Diabetes:              >6.4% Glycemic control for   <7.0% adults with diabetes     CBG: Recent Labs  Lab 08/08/19 1626 08/08/19 1943 08/08/19 2340 08/09/19 0349 08/09/19 0741  GLUCAP 124* 118* 155* 162* 113*    CC Time: 33 minutes   Noe Gens, MSN, NP-C [facility] 08/09/2019, 8:47 AM   Please see [email or web address] for pager details.

## 2019-08-10 ENCOUNTER — Inpatient Hospital Stay (HOSPITAL_COMMUNITY): Payer: BC Managed Care – PPO

## 2019-08-10 DIAGNOSIS — J189 Pneumonia, unspecified organism: Secondary | ICD-10-CM | POA: Diagnosis not present

## 2019-08-10 LAB — GLUCOSE, CAPILLARY
Glucose-Capillary: 192 mg/dL — ABNORMAL HIGH (ref 70–99)
Glucose-Capillary: 146 mg/dL — ABNORMAL HIGH (ref 70–99)
Glucose-Capillary: 111 mg/dL — ABNORMAL HIGH (ref 70–99)
Glucose-Capillary: 207 mg/dL — ABNORMAL HIGH (ref 70–99)
Glucose-Capillary: 231 mg/dL — ABNORMAL HIGH (ref 70–99)
Glucose-Capillary: 162 mg/dL — ABNORMAL HIGH (ref 70–99)
Glucose-Capillary: 94 mg/dL (ref 70–99)
Glucose-Capillary: 134 mg/dL — ABNORMAL HIGH (ref 70–99)

## 2019-08-10 LAB — CBC
HCT: 26 % — ABNORMAL LOW (ref 39.0–52.0)
Hemoglobin: 7.3 g/dL — ABNORMAL LOW (ref 13.0–17.0)
MCH: 30.5 pg (ref 26.0–34.0)
MCHC: 28.1 g/dL — ABNORMAL LOW (ref 30.0–36.0)
MCV: 108.8 fL — ABNORMAL HIGH (ref 80.0–100.0)
Platelets: 149 10*3/uL — ABNORMAL LOW (ref 150–400)
RBC: 2.39 MIL/uL — ABNORMAL LOW (ref 4.22–5.81)
RDW: 17.7 % — ABNORMAL HIGH (ref 11.5–15.5)
WBC: 7.6 10*3/uL (ref 4.0–10.5)
nRBC: 0 % (ref 0.0–0.2)

## 2019-08-10 LAB — BASIC METABOLIC PANEL
Anion gap: 5 (ref 5–15)
BUN: 32 mg/dL — ABNORMAL HIGH (ref 8–23)
CO2: 47 mmol/L — ABNORMAL HIGH (ref 22–32)
Calcium: 8.5 mg/dL — ABNORMAL LOW (ref 8.9–10.3)
Chloride: 92 mmol/L — ABNORMAL LOW (ref 98–111)
Creatinine, Ser: <0.3 mg/dL — ABNORMAL LOW (ref 0.61–1.24)
Glucose, Bld: 137 mg/dL — ABNORMAL HIGH (ref 70–99)
Potassium: 3.6 mmol/L (ref 3.5–5.1)
Sodium: 144 mmol/L (ref 135–145)

## 2019-08-10 NOTE — Progress Notes (Signed)
NAME:  Steve [name], MRN:  [id number], DOB:  06-30-1955, LOS: 39 ADMISSION DATE:  07/08/2019, CONSULTATION DATE:  3/29 REFERRING MD:  [name], CHIEF COMPLAINT:  dyspnea   Brief History   64 y/o male, with prior COVID infection 05/2019, admitted with severe acute respiratory failure with hypoxemia in the setting of receiving chemotherapy for Hodgkin's lymphoma. ICU course prolonged due to ARDS, malnutrition.    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 ARDS. Bilateral PTX s/p CTx2 4/19 Remains on vent 60%/PEEP 8, likely to need trach  4/20 trach placed 4/26 Worsening SQ air, CT with tension pneumomediastinum, chest tube suction increased 4/29 On neo 36mg's 4/30 Weaning neo  Consults:  ID >> s/o 4/09 Oncology >> s/o 4/05  Procedures:  4/8 ETT > 4/20 4/20 Trach >> 4/9 bilateral chest tubes  Significant Diagnostic Tests:   CTA chest 3/29 > no PE, diffuse GGO upper lobes, patchy consolidation in bases, peripheral fibrotic changes noted on prior CT chest still present  ECHO 3/30 >LVEF 60 to 626% grade 1 diastolic dysfunction. Mildly dilated LA, normal RV. Trivial MR and TR,otherwise normal valves  CT Intraperitoneal air, likely extension of the pneumomediastinum/pneumothorax. An intra-abdominal etiology such as bowel perforation is favored less likely.Extensive pneumomediastinum and partially visualized right pneumothorax. Diarrheal state. Correlation with clinical exam and stool cultures recommended. No bowel obstruction. Normal appendix. Sigmoid diverticulosis  CT head 4/22 > NAICP, sub cutaneous air in neck, bilateral mastoid effusions CT Chest w/o 4/26 >> tension pneumomediastinum,  bilateral chest tubes in place, continued widespread pulmonary opacity with development of some bronchiectasis  Micro Data:  UC 3/29 >>negative  BCx2 3/29 >>negative  PJP 4/2 >>negative  Fungitel 3/29 >> positive Blastomyces Antigen 3/20 >> negative Respiratory Culture 4/4 >> staph epidermis >> S-vanco, clinda BAL 4/8 >>candida glabrata Fungal (BAL) 4/8 >> Candida glabrata PJP (BAL) 4/8 >>negative  Aspergillus antigen (BAL) 4/8 >> Negative Tracheal aspirate 4/29 >>  Antimicrobials:  Cefepime 3/29 >>3/31 Vanc 3/29 >>3/30 Azithromycin 3/29 >> 4/1 Fluconazole 3/29 >> 4/13 Meropenem 3/31 >> 4/6 Bactrim 3/31 >> 4/4  Clindamycin 4/5 >>4/20 Primaquine 4/5 >>4/20 Anidulafungin 4/12 >> 4/20  Bactrim (PCP prophylaxis)   Interim history/subjective:  Remains on neosynephrine  On CPAP 15/5  More alert Subcutaneous emphysema is better  Objective   Blood pressure 125/72, pulse 99, temperature 99 F (37.2 C), temperature source Bladder, resp. rate (!) 25, height _0  (1.676 m), weight 69.6 kg, SpO2 91 %.    Vent Mode: PRVC FiO2 (%):  [40 %-50 %] 40 % Set Rate:  [26 bmp] 26 bmp Vt Set:  [380 mL] 380 mL PEEP:  [5 cmH20] 5 cmH20 Pressure Support:  [15 cmH20] 15 cmH20 Plateau Pressure:  [21 cmH20-28 cmH20] 25 cmH20   Intake/Output Summary (Last 24 hours) at 08/10/2019 1327 Last data filed at 08/10/2019 1222 Gross per 24 hour  Intake 3546.28 ml  Output 1282 ml  Net 2264.28 ml   Filed Weights   08/08/19 0352 08/09/19 0500 08/10/19 0500  Weight: 69.3 kg 70.6 kg 69.6 kg    Examination: General: chronically critically ill appearing adult male lying in bed on vent HEENT: Moist oral mucosa, temporal wasting Neuro: Awakens to voice, not following commands CV: s1s2 RRR,  no m/r/g PULM: Coarse breath sounds GI: soft, bsx4 active  Extremities: warm/dry, trace generalized UE edema   Resolved Hospital Problem list   Candida glabrata in BAL PJP pneumonia, suspected, treated  for 21 days  Assessment & Plan:   ARDS   ? Central apnea  -PRVC 8cc/kg as rest mode  -daily PSV trials, his global weakness has been a barrier to weaning  -trach care per protocol  -VAP prevention measures -continue bactrim for PCP prophylaxis  -TOC working on process for LTAC placement   Spontaneous pneumothoraces Tension Pneumomediastinum Sub cutaneous emphysema, peritoneal free air -continue chest tubes to 40 cm suction -follow daily CXR while CT's in place-no pneumothorax 5/1 -chest tube care per protocol  Acute metabolic encephalopathy , improving Need for sedation for mechanical ventilation Fentanyl tachyphylaxis At risk for encephalopathy due to hypercarbia -RASS Goal 0 to -1  -PRN dilaudid for pain  -delirium prevention measures   Hypernatremia -trend BMP -Improving -continue free water 200 ml Q4  Anemia of chronic illness Mild Thrombocytopenia  Hodgkin lymphoma -follow CBC -transfuse per protocol  Hyperlipidemia -continue statin   Severe protein calorie malnutrition -TF per Nutrition   Physical deconditioning -PT efforts   Sacral Decubitus  -WOC recommendations -frequent turning   Goals of care -appreciate prior discussion with palliative care, family wishes for full scope.  They have young children (44 & 24 y/o) -Continue current management -Prognosis is poor  Best practice:  Diet: tube feeding Pain/Anxiety/Delirium protocol (if indicated): as above VAP protocol (if indicated): yes DVT prophylaxis: lovenox GI prophylaxis: PPI Glucose control: SSI Mobility: bed rest Code Status: full Family Communication: Wife updated at bedside 4/30 am Disposition: ICU  Labs   CBC: Recent Labs  Lab 08/05/19 0347 08/05/19 0347 08/06/19 0412 08/07/19 0439 08/08/19 0340 08/09/19 0534 08/10/19 0503  WBC 8.3   < > 8.5 8.3 8.5 8.0 7.6  NEUTROABS 5.8  --   --   --   --   --   --   HGB 8.3*   < > 7.6* 7.6* 7.6* 7.7* 7.3*  HCT 29.5*   < > 27.0* 26.6*  27.4* 26.9* 26.0*  MCV 111.3*   < > 110.7* 111.3* 111.8* 112.1* 108.8*  PLT 114*   < > 113* 130* 135* 137* 149*   < > = values in this interval not displayed.    Basic Metabolic Panel: Recent Labs  Lab 08/05/19 2115 08/05/19 2115 08/06/19 0412 08/07/19 0439 08/08/19 0340 08/09/19 0534 08/10/19 0503  NA 150*   < > 148* 146* 147* 144 144  K 3.8   < > 3.6 3.8 4.0 3.7 3.6  CL 90*   < > 93* 93* 93* 92* 92*  CO2 >50*   < > 47* 49* 48* 49* 47*  GLUCOSE 133*   < > 181* 136* 148* 142* 137*  BUN 48*   < > 38* 36* 36* 32* 32*  CREATININE 0.49*   < > 0.37* 0.32* 0.37* <0.30* <0.30*  CALCIUM 8.7*   < > 8.2* 8.6* 8.5* 8.5* 8.5*  MG 2.2  --   --   --   --   --   --    < > = values in this interval not displayed.   GFR: CrCl cannot be calculated (This lab value cannot be used to calculate CrCl because it is not a number: <0.30). Recent Labs  Lab 08/07/19 0439 08/08/19 0340 08/09/19 0534 08/10/19 0503  WBC 8.3 8.5 8.0 7.6    Liver  Function Tests: No results for input(s): AST, ALT, ALKPHOS, BILITOT, PROT, ALBUMIN in the last 168 hours. No results for input(s): LIPASE, AMYLASE in the last 168 hours. No results for input(s): AMMONIA in the last 168 hours.  ABG    Component Value Date/Time   PHART 7.393 07/28/2019 2359   PCO2ART 87.5 (HH) 07/28/2019 2359   PO2ART 64.7 (L) 07/28/2019 2359   HCO3 52.1 (H) 07/28/2019 2359   TCO2 28 07/08/2019 1331   O2SAT 91.8 07/28/2019 2359     Coagulation Profile: No results for input(s): INR, PROTIME in the last 168 hours.  Cardiac Enzymes: No results for input(s): CKTOTAL, CKMB, CKMBINDEX, TROPONINI in the last 168 hours.  HbA1C: Hgb A1c MFr Bld  Date/Time Value Ref Range Status  07/09/2019 05:10 AM 7.3 (H) 4.8 - 5.6 % Final    Comment:    (NOTE) Pre diabetes:          5.7%-6.4% Diabetes:              >6.4% Glycemic control for   <7.0% adults with diabetes   05/19/2019 03:56 AM 8.3 (H) 4.8 - 5.6 % Final    Comment:    (NOTE) Pre  diabetes:          5.7%-6.4% Diabetes:              >6.4% Glycemic control for   <7.0% adults with diabetes     CBG: Recent Labs  Lab 08/10/19 0157 08/10/19 0327 08/10/19 0756 08/10/19 1308 08/10/19 1310  GLUCAP 192* 146* 111* 231* 207*   The patient is critically ill with multiple organ systems failure and requires high complexity decision making for assessment and support, frequent evaluation and titration of therapies, application of advanced monitoring technologies and extensive interpretation of multiple databases. Critical Care Time devoted to patient care services described in this note independent of APP/resident time (if applicable)  is 32 minutes.   Sherrilyn Rist MD [facility] Personal pager: [phone] If unanswered, please page CCM On-call: [phone]

## 2019-08-11 ENCOUNTER — Inpatient Hospital Stay (HOSPITAL_COMMUNITY): Payer: BC Managed Care – PPO

## 2019-08-11 DIAGNOSIS — J189 Pneumonia, unspecified organism: Secondary | ICD-10-CM | POA: Diagnosis not present

## 2019-08-11 LAB — GLUCOSE, CAPILLARY
Glucose-Capillary: 112 mg/dL — ABNORMAL HIGH (ref 70–99)
Glucose-Capillary: 125 mg/dL — ABNORMAL HIGH (ref 70–99)
Glucose-Capillary: 143 mg/dL — ABNORMAL HIGH (ref 70–99)
Glucose-Capillary: 143 mg/dL — ABNORMAL HIGH (ref 70–99)
Glucose-Capillary: 152 mg/dL — ABNORMAL HIGH (ref 70–99)
Glucose-Capillary: 187 mg/dL — ABNORMAL HIGH (ref 70–99)

## 2019-08-11 LAB — CULTURE, RESPIRATORY W GRAM STAIN: Culture: NORMAL

## 2019-08-11 NOTE — Progress Notes (Signed)
Pt. accidentally removed panda RN told by respiratory therapist that panda tubed was coiled in pt. Mouth. Tube feeds were turned off immediately and panda was removed from pt nare. RN attempted to replace twice but knowing that the first panda had to be placed with the glidescope RN notified MD. MD ordered RN to give another 0.5mg  dilaudid and 2 of versed. MD and 2 RN's placed panda with glidescope on the first attempt. Awaiting chest x-ray.

## 2019-08-11 NOTE — Progress Notes (Signed)
NAME:  Steve [name], MRN:  [id number], DOB:  June 14, 1955, LOS: 34 ADMISSION DATE:  07/08/2019, CONSULTATION DATE:  3/29 REFERRING MD:  [name], CHIEF COMPLAINT:  dyspnea   Brief History   64 y/o male, with prior COVID infection 05/2019, admitted with severe acute respiratory failure with hypoxemia in the setting of receiving chemotherapy for Hodgkin's lymphoma. ICU course prolonged due to ARDS, malnutrition.    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 ARDS. Bilateral PTX s/p CTx2 4/19 Remains on vent 60%/PEEP 8, likely to need trach  4/20 trach placed 4/26 Worsening SQ air, CT with tension pneumomediastinum, chest tube suction increased 4/29 On neo 38mg's 4/30 Weaning neo  Consults:  ID >> s/o 4/09 Oncology >> s/o 4/05  Procedures:  4/8 ETT > 4/20 4/20 Trach >> 4/9 bilateral chest tubes  Significant Diagnostic Tests:   CTA chest 3/29 > no PE, diffuse GGO upper lobes, patchy consolidation in bases, peripheral fibrotic changes noted on prior CT chest still present  ECHO 3/30 >LVEF 60 to 616% grade 1 diastolic dysfunction. Mildly dilated LA, normal RV. Trivial MR and TR,otherwise normal valves  CT Intraperitoneal air, likely extension of the pneumomediastinum/pneumothorax. An intra-abdominal etiology such as bowel perforation is favored less likely.Extensive pneumomediastinum and partially visualized right pneumothorax. Diarrheal state. Correlation with clinical exam and stool cultures recommended. No bowel obstruction. Normal appendix. Sigmoid diverticulosis  CT head 4/22 > NAICP, sub cutaneous air in neck, bilateral mastoid effusions CT Chest w/o 4/26 >> tension pneumomediastinum,  bilateral chest tubes in place, continued widespread pulmonary opacity with development of some bronchiectasis  Micro Data:  UC 3/29 >>negative  BCx2 3/29 >>negative  PJP 4/2 >>negative  Fungitel 3/29 >> positive Blastomyces Antigen 3/20 >> negative Respiratory Culture 4/4 >> staph epidermis >> S-vanco, clinda BAL 4/8 >>candida glabrata Fungal (BAL) 4/8 >> Candida glabrata PJP (BAL) 4/8 >>negative  Aspergillus antigen (BAL) 4/8 >> Negative Tracheal aspirate 4/29 >>  Antimicrobials:  Cefepime 3/29 >>3/31 Vanc 3/29 >>3/30 Azithromycin 3/29 >> 4/1 Fluconazole 3/29 >> 4/13 Meropenem 3/31 >> 4/6 Bactrim 3/31 >> 4/4  Clindamycin 4/5 >>4/20 Primaquine 4/5 >>4/20 Anidulafungin 4/12 >> 4/20  Bactrim (PCP prophylaxis)   Interim history/subjective:  Remains on neosynephrine  More alert Subcutaneous emphysema is better Had to have Panda replaced this morning  Objective   Blood pressure (!) 101/57, pulse 80, temperature 98.2 F (36.8 C), resp. rate (!) 28, height _0  (1.676 m), weight 69.6 kg, SpO2 99 %.    Vent Mode: PRVC FiO2 (%):  [40 %-50 %] 40 % Set Rate:  [26 bmp] 26 bmp Vt Set:  [380 mL] 380 mL PEEP:  [5 cmH20] 5 cmH20 Plateau Pressure:  [17 cmH20-28 cmH20] 17 cmH20   Intake/Output Summary (Last 24 hours) at 08/11/2019 1430 Last data filed at 08/11/2019 1300 Gross per 24 hour  Intake 1123.86 ml  Output 1450 ml  Net -326.14 ml   Filed Weights   08/08/19 0352 08/09/19 0500 08/10/19 0500  Weight: 69.3 kg 70.6 kg 69.6 kg    Examination: General: Chronically ill-appearing, comfortable HEENT: Moist oral mucosa, temporal wasting Neuro: Awakens to voice CV: S1-S2 appreciated PULM: Coarse breath sounds GI: soft, bsx4 active  Extremities: Trace edema  Resolved Hospital Problem list  Candida glabrata in BAL PJP pneumonia, suspected, treated for 21 days  Assessment & Plan:   ARDS   ? Central apnea  -PRVC 8cc/kg as rest mode  -daily PSV trials, his  global weakness has been a barrier to weaning  -trach care per protocol  -VAP prevention measures -continue bactrim for PCP prophylaxis  -TOC working on process for LTAC placement   Spontaneous pneumothoraces Tension Pneumomediastinum Sub cutaneous emphysema, peritoneal free air -continue chest tubes to 40 cm suction -follow daily CXR while CT's in place-5% pneumothorax noted, patient does appear stable -Follow chest x-rays -chest tube care per protocol  Acute metabolic encephalopathy , improving Need for sedation for mechanical ventilation Fentanyl tachyphylaxis At risk for encephalopathy due to hypercarbia -RASS Goal 0 to -1  -PRN dilaudid for pain  -delirium prevention measures   Hypernatremia -trend BMP -Improving-144 -continue free water 200 ml Q4  Anemia of chronic illness Mild Thrombocytopenia  Hodgkin lymphoma -follow CBC -transfuse per protocol  Hyperlipidemia -continue statin   Severe protein calorie malnutrition -TF per Nutrition   Physical deconditioning -PT efforts   Sacral Decubitus  -WOC recommendations -frequent turning   Goals of care -appreciate prior discussion with palliative care, family wishes for full scope.  They have young children (78 & 64 y/o) -Continue current management -Prognosis is poor  Labs in a.m., chest x-ray a.m.  Best practice:  Diet: tube feeding Pain/Anxiety/Delirium protocol (if indicated): as above VAP protocol (if indicated): yes DVT prophylaxis: lovenox GI prophylaxis: PPI Glucose control: SSI Mobility: bed rest Code Status: full Family Communication: Wife updated at bedside 4/30 am Disposition: ICU  Labs   CBC: Recent Labs  Lab 08/05/19 0347 08/05/19 0347 08/06/19 0412 08/07/19 0439 08/08/19 0340 08/09/19 0534 08/10/19 0503  WBC 8.3   < > 8.5 8.3 8.5 8.0 7.6  NEUTROABS 5.8  --   --   --   --   --   --   HGB 8.3*   < > 7.6* 7.6* 7.6* 7.7* 7.3*  HCT 29.5*   < > 27.0* 26.6* 27.4* 26.9* 26.0*  MCV  111.3*   < > 110.7* 111.3* 111.8* 112.1* 108.8*  PLT 114*   < > 113* 130* 135* 137* 149*   < > = values in this interval not displayed.    Basic Metabolic Panel: Recent Labs  Lab 08/05/19 2115 08/05/19 2115 08/06/19 0412 08/07/19 0439 08/08/19 0340 08/09/19 0534 08/10/19 0503  NA 150*   < > 148* 146* 147* 144 144  K 3.8   < > 3.6 3.8 4.0 3.7 3.6  CL 90*   < > 93* 93* 93* 92* 92*  CO2 >50*   < > 47* 49* 48* 49* 47*  GLUCOSE 133*   < > 181* 136* 148* 142* 137*  BUN 48*   < > 38* 36* 36* 32* 32*  CREATININE 0.49*   < > 0.37* 0.32* 0.37* <0.30* <0.30*  CALCIUM 8.7*   < > 8.2* 8.6* 8.5* 8.5* 8.5*  MG 2.2  --   --   --   --   --   --    < > = values in this interval not displayed.   GFR: CrCl cannot be calculated (This lab value cannot be used to calculate CrCl because it is not a number: <0.30). Recent Labs  Lab 08/07/19 0439 08/08/19 0340 08/09/19 0534 08/10/19 0503  WBC 8.3 8.5 8.0 7.6    Liver Function Tests: No results for input(s): AST, ALT, ALKPHOS, BILITOT, PROT,  ALBUMIN in the last 168 hours. No results for input(s): LIPASE, AMYLASE in the last 168 hours. No results for input(s): AMMONIA in the last 168 hours.  ABG    Component Value Date/Time   PHART 7.393 07/28/2019 2359   PCO2ART 87.5 (HH) 07/28/2019 2359   PO2ART 64.7 (L) 07/28/2019 2359   HCO3 52.1 (H) 07/28/2019 2359   TCO2 28 07/08/2019 1331   O2SAT 91.8 07/28/2019 2359     Coagulation Profile: No results for input(s): INR, PROTIME in the last 168 hours.  Cardiac Enzymes: No results for input(s): CKTOTAL, CKMB, CKMBINDEX, TROPONINI in the last 168 hours.  HbA1C: Hgb A1c MFr Bld  Date/Time Value Ref Range Status  07/09/2019 05:10 AM 7.3 (H) 4.8 - 5.6 % Final    Comment:    (NOTE) Pre diabetes:          5.7%-6.4% Diabetes:              >6.4% Glycemic control for   <7.0% adults with diabetes   05/19/2019 03:56 AM 8.3 (H) 4.8 - 5.6 % Final    Comment:    (NOTE) Pre diabetes:           5.7%-6.4% Diabetes:              >6.4% Glycemic control for   <7.0% adults with diabetes     CBG: Recent Labs  Lab 08/10/19 1935 08/10/19 2321 08/11/19 0422 08/11/19 0753 08/11/19 1146  GLUCAP 94 134* 143* 152* 112*   The patient is critically ill with multiple organ systems failure and requires high complexity decision making for assessment and support, frequent evaluation and titration of therapies, application of advanced monitoring technologies and extensive interpretation of multiple databases. Critical Care Time devoted to patient care services described in this note independent of APP/resident time (if applicable)  is 32 minutes.   Sherrilyn Rist MD [facility] Personal pager: [phone] If unanswered, please page CCM On-call: [phone]

## 2019-08-12 ENCOUNTER — Inpatient Hospital Stay (HOSPITAL_COMMUNITY): Payer: BC Managed Care – PPO

## 2019-08-12 DIAGNOSIS — J9601 Acute respiratory failure with hypoxia: Secondary | ICD-10-CM | POA: Diagnosis not present

## 2019-08-12 DIAGNOSIS — Z930 Tracheostomy status: Secondary | ICD-10-CM | POA: Diagnosis not present

## 2019-08-12 LAB — CBC WITH DIFFERENTIAL/PLATELET
Abs Immature Granulocytes: 0.03 10*3/uL (ref 0.00–0.07)
Basophils Absolute: 0 10*3/uL (ref 0.0–0.1)
Basophils Relative: 0 %
Eosinophils Absolute: 0.5 10*3/uL (ref 0.0–0.5)
Eosinophils Relative: 6 %
HCT: 28.3 % — ABNORMAL LOW (ref 39.0–52.0)
Hemoglobin: 8.1 g/dL — ABNORMAL LOW (ref 13.0–17.0)
Immature Granulocytes: 0 %
Lymphocytes Relative: 35 %
Lymphs Abs: 3 10*3/uL (ref 0.7–4.0)
MCH: 31.3 pg (ref 26.0–34.0)
MCHC: 28.6 g/dL — ABNORMAL LOW (ref 30.0–36.0)
MCV: 109.3 fL — ABNORMAL HIGH (ref 80.0–100.0)
Monocytes Absolute: 0.5 10*3/uL (ref 0.1–1.0)
Monocytes Relative: 5 %
Neutro Abs: 4.6 10*3/uL (ref 1.7–7.7)
Neutrophils Relative %: 54 %
Platelets: 211 10*3/uL (ref 150–400)
RBC: 2.59 MIL/uL — ABNORMAL LOW (ref 4.22–5.81)
RDW: 17.7 % — ABNORMAL HIGH (ref 11.5–15.5)
WBC: 8.6 10*3/uL (ref 4.0–10.5)
nRBC: 0 % (ref 0.0–0.2)

## 2019-08-12 LAB — BASIC METABOLIC PANEL
Anion gap: 5 (ref 5–15)
BUN: 30 mg/dL — ABNORMAL HIGH (ref 8–23)
CO2: 46 mmol/L — ABNORMAL HIGH (ref 22–32)
Calcium: 8.9 mg/dL (ref 8.9–10.3)
Chloride: 91 mmol/L — ABNORMAL LOW (ref 98–111)
Creatinine, Ser: 0.3 mg/dL — ABNORMAL LOW (ref 0.61–1.24)
GFR calc Af Amer: >60 mL/min (ref >60)
GFR calc non Af Amer: >60 mL/min (ref >60)
Glucose, Bld: 136 mg/dL — ABNORMAL HIGH (ref 70–99)
Potassium: 3.9 mmol/L (ref 3.5–5.1)
Sodium: 142 mmol/L (ref 135–145)

## 2019-08-12 LAB — GLUCOSE, CAPILLARY
Glucose-Capillary: 118 mg/dL — ABNORMAL HIGH (ref 70–99)
Glucose-Capillary: 123 mg/dL — ABNORMAL HIGH (ref 70–99)
Glucose-Capillary: 123 mg/dL — ABNORMAL HIGH (ref 70–99)
Glucose-Capillary: 125 mg/dL — ABNORMAL HIGH (ref 70–99)
Glucose-Capillary: 136 mg/dL — ABNORMAL HIGH (ref 70–99)
Glucose-Capillary: 157 mg/dL — ABNORMAL HIGH (ref 70–99)
Glucose-Capillary: 170 mg/dL — ABNORMAL HIGH (ref 70–99)

## 2019-08-12 LAB — CORTISOL: Cortisol, Plasma: 15.3 ug/dL

## 2019-08-12 MED ORDER — PRO-STAT SUGAR FREE PO LIQD
30.0000 mL | Freq: Three times a day (TID) | ORAL | Status: DC
  Administered 2019-08-12 – 2019-08-21 (×23): 30 mL
  Filled 2019-08-12 (×23): qty 30

## 2019-08-12 MED ORDER — VITAL AF 1.2 CAL PO LIQD
1000.0000 mL | ORAL | Status: DC
  Administered 2019-08-12 – 2019-08-20 (×8): 1000 mL

## 2019-08-12 NOTE — Progress Notes (Addendum)
Nutrition Follow-up  DOCUMENTATION CODES:   Non-severe (moderate) malnutrition in context of chronic illness  INTERVENTION:  - will adjust TF regimen: Vital AF 1.2 @ 45 ml/hr with 30 ml prostat TID and 1 packet juven BID. - this regimen will provide 1786 kcal (104% re-estimated kcal need), 131 grams protein, and 876 ml free water. - free water flush per MD/NP (currently 200 ml every 4 hours).  - able to talk with staff about prolonged NGT, recommendation for PEG; will hold off at this time.    NUTRITION DIAGNOSIS:   Moderate Malnutrition related to chronic illness, cancer and cancer related treatments as evidenced by mild fat depletion, mild muscle depletion, percent weight loss. -ongoing  GOAL:   Patient will meet greater than or equal to 90% of their needs -met with TF regimen  MONITOR:   Vent status, TF tolerance, Labs, Weight trends, Skin  ASSESSMENT:   64 year-old male with a history of Hodgkin's lymphoma (stage 3, diagnosed 02/2019) on chemotherapy. He follows with Dr. Maylon [name] in Oncology. His most recent treatment was on 3/8. He presented to the ED with worsening respiratory status and inability to tolerate PO intake recently. He had his chemo delayed this week. He has a history of neutropenic fever in the past. He had a good response to chemo on his first follow up PET scan. His appetite has been poor and he has been receiving IV fluids.  Significant Events: 3/29- admission 3/30- initial RD assessment 4/8- intubation and OGT placement 4/20- tracheostomy; OGT removed and small bore NGT placed in R nare 5/2- small bore NGT replaced in R nare   Patient remains intubated via trach with small bore NGT in R nare (taped at 63 cm) and he is receiving Vital AF 1.2 @ 50 ml/hr with 30 ml prostat BID, 1 packet juven BID, and 200 ml free water every 4 hours. This regimen is providing 1830 kcal, 125 grams protein, and 2173 ml free water.   Weight has been fairly stable over the past 4-5  days. Flow sheet documentation indicates moderate edema to BUE.  No family/visitors at bedside. Able to talk with RN who cared for patient yesterday and today. NGT removed after being accidentally removed; TF off for ~1 hour yesterday prior to replacement. No issues with TF. Able to talk with Steve [name] (PCCM NP) also.   Per notes: - ARDS with significant deconditioning - spontaneous pneumothoraces s/p chest tubes - acute metabolic encephalopathy--improving - Full Code status    Patient is currently intubated on ventilator support MV: 10.4 L/min Temp (24hrs), Avg:98.7 F (37.1 C), Min:98.2 F (36.8 C), Max:99.1 F (37.3 C) Propofol: none BP: 124/59 and MAP: 83  Labs reviewed; Ca: 7.1 mg/dl, Mg: 1.6 mg/dl. Medications reviewed; 300 mg ferrous sulfate per tube/day, sliding scale novolog, 6 units lantus/day, 15 ml multivitamin per tube/day, 40 mg protonix per tube/day.  Drip; neo @ 170 mcg/min.    Diet Order:   Diet Order    None      EDUCATION NEEDS:   No education needs have been identified at this time  Skin:  Skin Assessment: Skin Integrity Issues: Skin Integrity Issues:: Stage I, Unstageable DTI: sacrum (4/15) Stage I: R heel (4/19) Unstageable: full thickness to lip (4/15) and sacrum (4/26)  Last BM:  5/3  Height:   Ht Readings from Last 1 Encounters:  08/10/19 _0  (1.676 m)    Weight:   Wt Readings from Last 1 Encounters:  08/10/19 69.6 kg  Ideal Body Weight:  64.5 kg  BMI:  Body mass index is 24.77 kg/m.  Estimated Nutritional Needs:   Kcal:  1720 kcal  Protein:  125-139 grams (1.8-2 grams/kg)  Fluid:  >/= 2 L/day     Steve Matin, MS, RD, LDN, CNSC Inpatient Clinical Dietitian RD pager # available in [facility]  After hours/weekend pager # available in [facility]

## 2019-08-12 NOTE — Progress Notes (Signed)
NAME:  Steve [name], MRN:  [id number], DOB:  06-24-1955, LOS: 37 ADMISSION DATE:  07/08/2019, CONSULTATION DATE:  3/29 REFERRING MD:  [name], CHIEF COMPLAINT:  dyspnea   Brief History   64 y/o male, with prior COVID infection 05/2019, admitted with severe acute respiratory failure with hypoxemia in the setting of receiving chemotherapy for Hodgkin's lymphoma. ICU course prolonged due to ARDS, malnutrition.    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 ARDS. Bilateral PTX s/p CTx2 4/19 Remains on vent 60%/PEEP 8, likely to need trach  4/20 trach placed 4/26 Worsening SQ air, CT with tension pneumomediastinum, chest tube suction increased 4/29 On neo 86mg's 4/30 Weaning neo  Consults:  ID >> s/o 4/09 Oncology >> s/o 4/05  Procedures:  4/8 ETT > 4/20 4/20 Trach >> 4/9 bilateral chest tubes  Significant Diagnostic Tests:   CTA chest 3/29 > no PE, diffuse GGO upper lobes, patchy consolidation in bases, peripheral fibrotic changes noted on prior CT chest still present  ECHO 3/30 >LVEF 60 to 651% grade 1 diastolic dysfunction. Mildly dilated LA, normal RV. Trivial MR and TR,otherwise normal valves  CT Intraperitoneal air, likely extension of the pneumomediastinum/pneumothorax. An intra-abdominal etiology such as bowel perforation is favored less likely.Extensive pneumomediastinum and partially visualized right pneumothorax. Diarrheal state. Correlation with clinical exam and stool cultures recommended. No bowel obstruction. Normal appendix. Sigmoid diverticulosis  CT head 4/22 > NAICP, sub cutaneous air in neck, bilateral mastoid effusions CT Chest w/o 4/26 >> tension pneumomediastinum,  bilateral chest tubes in place, continued widespread pulmonary opacity with development of some bronchiectasis  Micro Data:  UC 3/29 >>negative  BCx2 3/29 >>negative  PJP 4/2 >>negative  Fungitel 3/29 >> positive Blastomyces Antigen 3/20 >> negative Respiratory Culture 4/4 >> staph epidermis >> S-vanco, clinda BAL 4/8 >>candida glabrata Fungal (BAL) 4/8 >> Candida glabrata PJP (BAL) 4/8 >>negative  Aspergillus antigen (BAL) 4/8 >> Negative Tracheal aspirate 4/29 >>  Antimicrobials:  Cefepime 3/29 >>3/31 Vanc 3/29 >>3/30 Azithromycin 3/29 >> 4/1 Fluconazole 3/29 >> 4/13 Meropenem 3/31 >> 4/6 Bactrim 3/31 >> 4/4  Clindamycin 4/5 >>4/20 Primaquine 4/5 >>4/20 Anidulafungin 4/12 >> 4/20  Bactrim (PCP prophylaxis)   Interim history/subjective:  Remains on neosynephrine  More alert Subcutaneous emphysema is better Had to have Panda replaced this morning  Objective   Blood pressure (Abnormal) 93/49, pulse 78, temperature 99.1 F (37.3 C), resp. rate (Abnormal) 27, height _0  (1.676 m), weight 69.6 kg, SpO2 94 %.    Vent Mode: PRVC FiO2 (%):  [40 %-50 %] 40 % Set Rate:  [26 bmp] 26 bmp Vt Set:  [380 mL] 380 mL PEEP:  [5 cmH20] 5 cmH20 Plateau Pressure:  [17 cmH20-28 cmH20] 24 cmH20   Intake/Output Summary (Last 24 hours) at 08/12/2019 1046 Last data filed at 08/12/2019 0836 Gross per 24 hour  Intake 1575.86 ml  Output 1516 ml  Net 59.86 ml   Filed Weights   08/08/19 0352 08/09/19 0500 08/10/19 0500  Weight: 69.3 kg 70.6 kg 69.6 kg    Examination: General 64year old white male who remains ventilatory dependent requiring every 3 hour narcotics for tachypnea and what appears to be significant discomfort HENT NCAT no JVD. MMM # 6 trach unremarkable  Pulm diminished  throughout, marked accessory use, not tolerating weaning, chest tubes without air leak Cardiac distant regular rate and rhythm Abdomen soft nontender Extremities with poor muscular bulk, warm  dry Neuro opens eyes, minimal movement of extremities GU clear yellow  Resolved Hospital Problem list   Candida glabrata in BAL PJP pneumonia, suspected, treated for 21 days  Assessment & Plan:   Ventilator dependence w/ ARDS  ? Central apnea remains ventilator and trach dependent  -Profound deconditioning and severe malnutrition status post prolonged critical illness seem to be the primary barrier to ventilator liberation  plan Continue daily assessment for weaning Continue routine trach care Continuing Bactrim for PCP prophylaxis VAP bundle Needs LTAC  Spontaneous pneumothoraces Tension Pneumomediastinum Sub cutaneous emphysema, peritoneal free air Portable chest x-ray personally reviewed showing small stable bilateral pneumothoraces, with ongoing bilateral opacities -No air leak noted on chest tubes Plan We will continue current chest tubes at 40 cm suction Daily chest x-ray Given his overall frailty I am reluctant to make any changes at this point  Hypotension , No fever spikes or worsening leukocytosis, no tachycardia not meeting SIRS parameters.  Suspect this is drug-related, although also would be concerned about underlying adrenal function Plan Continue phenylephrine for mean arterial pressure goal greater than 65 Check cortisol  Acute metabolic encephalopathy , improving Need for sedation for mechanical ventilation Fentanyl tachyphylaxis Plan Continuing as needed Dilaudid for pain RASS goal 0 to -1 He is going to need extensive rehabilitation  Hypernatremia, normalized now Plan Continue free water   Anemia of chronic illness Mild Thrombocytopenia  Hodgkin lymphoma Plan Intermittent CBC and transfuse if hemoglobin less than 7  Hyperlipidemia Plan Continue statin  Severe protein calorie malnutrition Plan Continue tube feeds  Physical deconditioning Plan Will need extensive physical therapy  Sacral Decubitus  Plan Frequent  repositioning Maximize nutritional goals  Goals of care -appreciate prior discussion with palliative care, family wishes for full scope.  They have young children (20 & 71 y/o)   Best practice:  Diet: tube feeding Pain/Anxiety/Delirium protocol (if indicated): as above VAP protocol (if indicated): yes DVT prophylaxis: lovenox GI prophylaxis: PPI Glucose control: SSI Mobility: bed rest Code Status: full Family Communication: Wife updated at bedside 4/30 am Disposition: ICU  Critical care x32 minutes  Erick Colace ACNP-BC [place] Pager # [phone] OR # [phone] if no answer

## 2019-08-13 DIAGNOSIS — J9601 Acute respiratory failure with hypoxia: Secondary | ICD-10-CM | POA: Diagnosis not present

## 2019-08-13 DIAGNOSIS — J80 Acute respiratory distress syndrome: Secondary | ICD-10-CM | POA: Diagnosis not present

## 2019-08-13 DIAGNOSIS — Z930 Tracheostomy status: Secondary | ICD-10-CM | POA: Diagnosis not present

## 2019-08-13 LAB — CBC
HCT: 26.5 % — ABNORMAL LOW (ref 39.0–52.0)
Hemoglobin: 7.6 g/dL — ABNORMAL LOW (ref 13.0–17.0)
MCH: 31.3 pg (ref 26.0–34.0)
MCHC: 28.7 g/dL — ABNORMAL LOW (ref 30.0–36.0)
MCV: 109.1 fL — ABNORMAL HIGH (ref 80.0–100.0)
Platelets: 190 10*3/uL (ref 150–400)
RBC: 2.43 MIL/uL — ABNORMAL LOW (ref 4.22–5.81)
RDW: 17.4 % — ABNORMAL HIGH (ref 11.5–15.5)
WBC: 6.1 10*3/uL (ref 4.0–10.5)
nRBC: 0 % (ref 0.0–0.2)

## 2019-08-13 LAB — BASIC METABOLIC PANEL
Anion gap: 5 (ref 5–15)
BUN: 26 mg/dL — ABNORMAL HIGH (ref 8–23)
CO2: 45 mmol/L — ABNORMAL HIGH (ref 22–32)
Calcium: 8.6 mg/dL — ABNORMAL LOW (ref 8.9–10.3)
Chloride: 92 mmol/L — ABNORMAL LOW (ref 98–111)
Creatinine, Ser: <0.3 mg/dL — ABNORMAL LOW (ref 0.61–1.24)
Glucose, Bld: 121 mg/dL — ABNORMAL HIGH (ref 70–99)
Potassium: 4 mmol/L (ref 3.5–5.1)
Sodium: 142 mmol/L (ref 135–145)

## 2019-08-13 LAB — GLUCOSE, CAPILLARY
Glucose-Capillary: 108 mg/dL — ABNORMAL HIGH (ref 70–99)
Glucose-Capillary: 137 mg/dL — ABNORMAL HIGH (ref 70–99)
Glucose-Capillary: 163 mg/dL — ABNORMAL HIGH (ref 70–99)
Glucose-Capillary: 135 mg/dL — ABNORMAL HIGH (ref 70–99)
Glucose-Capillary: 152 mg/dL — ABNORMAL HIGH (ref 70–99)

## 2019-08-13 MED ORDER — MIDODRINE HCL 5 MG PO TABS
10.0000 mg | ORAL_TABLET | Freq: Three times a day (TID) | ORAL | Status: DC
  Administered 2019-08-13 – 2019-08-16 (×10): 10 mg via ORAL
  Filled 2019-08-13 (×10): qty 2

## 2019-08-13 NOTE — Progress Notes (Signed)
Patient noted to have new onset of pink-tinged urine this AM.It was also noted that there is bloody looking sediment in the drainage bag. Foley was flushed which produced more pink-tinged urine from the bladder. E-Link RN Jodie notified.  A CBC is being obtained at this time.  This RN will continue to monitor for other signs of bleeding and report adverse findings.

## 2019-08-13 NOTE — Progress Notes (Signed)
NAME:  Steve [name], MRN:  [id number], DOB:  [birth year], LOS: 37 ADMISSION DATE:  07/08/2019, CONSULTATION DATE:  3/29 REFERRING MD:  [name], CHIEF COMPLAINT:  dyspnea   Brief History   64 y/o male, with prior COVID infection 05/2019, admitted with severe acute respiratory failure with hypoxemia in the setting of receiving chemotherapy for Hodgkin's lymphoma. ICU course prolonged due to ARDS, malnutrition.    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 ARDS. Bilateral PTX s/p CTx2 4/19 Remains on vent 60%/PEEP 8, likely to need trach  4/20 trach placed 4/26 Worsening SQ air, CT with tension pneumomediastinum, chest tube suction increased 4/29 On neo 82mg's 4/30 Weaning neo 5/4 weaning phenylephrine profoundly weak Consults:  ID >> s/o 4/09 Oncology >> s/o 4/05  Procedures:  4/8 ETT > 4/20 4/20 Trach >> 4/9 bilateral chest tubes  Significant Diagnostic Tests:   CTA chest 3/29 > no PE, diffuse GGO upper lobes, patchy consolidation in bases, peripheral fibrotic changes noted on prior CT chest still present  ECHO 3/30 >LVEF 60 to 640% grade 1 diastolic dysfunction. Mildly dilated LA, normal RV. Trivial MR and TR,otherwise normal valves  CT Intraperitoneal air, likely extension of the pneumomediastinum/pneumothorax. An intra-abdominal etiology such as bowel perforation is favored less likely.Extensive pneumomediastinum and partially visualized right pneumothorax. Diarrheal state. Correlation with clinical exam and stool cultures recommended. No bowel obstruction. Normal appendix. Sigmoid diverticulosis  CT head 4/22 > NAICP, sub cutaneous air in neck, bilateral mastoid effusions CT Chest  w/o 4/26 >> tension pneumomediastinum, bilateral chest tubes in place, continued widespread pulmonary opacity with development of some bronchiectasis  Micro Data:  UC 3/29 >>negative  BCx2 3/29 >>negative  PJP 4/2 >>negative  Fungitel 3/29 >> positive Blastomyces Antigen 3/20 >> negative Respiratory Culture 4/4 >> staph epidermis >> S-vanco, clinda BAL 4/8 >>candida glabrata Fungal (BAL) 4/8 >> Candida glabrata PJP (BAL) 4/8 >>negative  Aspergillus antigen (BAL) 4/8 >> Negative Tracheal aspirate 4/29 >>  Antimicrobials:  Cefepime 3/29 >>3/31 Vanc 3/29 >>3/30 Azithromycin 3/29 >> 4/1 Fluconazole 3/29 >> 4/13 Meropenem 3/31 >> 4/6 Bactrim 3/31 >> 4/4  Clindamycin 4/5 >>4/20 Primaquine 4/5 >>4/20 Anidulafungin 4/12 >> 4/20  Bactrim (PCP prophylaxis)   Interim history/subjective:  No significant change  Objective   Blood pressure 119/77, pulse 83, temperature 99 F (37.2 C), temperature source Bladder, resp. rate (Abnormal) 26, height _0  (1.676 m), weight 72 kg, SpO2 91 %.    Vent Mode: PRVC FiO2 (%):  [40 %-45 %] 45 % Set Rate:  [26 bmp] 26 bmp Vt Set:  [380 mL] 380 mL PEEP:  [5 cmH20] 5 cmH20 Plateau Pressure:  [22 cmH20-28 cmH20] 23 cmH20   Intake/Output Summary (Last 24 hours) at 08/13/2019 0900 Last data filed at 08/13/2019 09811Gross per 24 hour  Intake 590.77 ml  Output 2620 ml  Net -2029.23 ml   Filed Weights   08/09/19 0500 08/10/19 0500 08/13/19 0500  Weight: 70.6 kg 69.6 kg 72 kg    Examination: General cachectic 64year old white male remains on full ventilatory support with marked accessory use even on rest HEENT temporal wasting mucous membranes moist sclera nonicteric size 6 tracheostomy cuff unremarkable Pulmonary diminished throughout accessory use noted Cardiac: Regular rate and rhythm without  murmur rub or gallop Abdomen: Soft nontender no organomegaly Extremities: Warm dry brisk cap refill Neuro: Opens eyes at times, will  spontaneously move left arm, agitated at times frequently moving upper extremity shaking head back and forth will get diaphoretic, interpreted as pain and treated as such GU: Clear yellow Resolved Hospital Problem list   Candida glabrata in BAL PJP pneumonia, suspected, treated for 21 days  Assessment & Plan:   Ventilator dependence w/ ARDS  ? Central apnea remains ventilator and trach dependent  -Profound deconditioning and severe malnutrition status post prolonged critical illness seem to be the primary barrier to ventilator liberation  plan Cont daily assessment for weaning VAP bundle  Cont bactrim  Spontaneous pneumothoraces Tension Pneumomediastinum Sub cutaneous emphysema, peritoneal free air Portable chest x-ray personally reviewed showing small stable bilateral pneumothoraces, with ongoing bilateral opacities -No air leak noted on chest tubes Plan Cont chest tubes to sxn for now  AM CXR; if no pneumomediastinum and no airleak will decrease sxn  Hypotension , No fever spikes or worsening leukocytosis, no tachycardia not meeting SIRS parameters.  Suspect this is drug-related, although also would be concerned about underlying adrenal function w/ random cortisol 15.3 but as he is weaning off pressors don't think we need to add replacement as of yet  Plan Add midodrine  Cont to wean neo   Acute metabolic encephalopathy , improving Need for sedation for mechanical ventilation Fentanyl tachyphylaxis Plan PRN dilaudid  RASS goal 0 Needs extensive rehab   Hypernatremia, normalized now Plan Free water replacement   Anemia of chronic illness Mild Thrombocytopenia  Hodgkin lymphoma Plan Intermittent CBC  Hyperlipidemia Plan Cont statin   Severe protein calorie malnutrition Plan Cont wound care   Physical deconditioning Plan Will need extensive PT   Sacral Decubitus  Plan Count wound care   Goals of care -appreciate prior discussion with palliative care,  family wishes for full scope.  They have young children (57 & 51 y/o)   Best practice:  Diet: tube feeding Pain/Anxiety/Delirium protocol (if indicated): as above VAP protocol (if indicated): yes DVT prophylaxis: lovenox GI prophylaxis: PPI Glucose control: SSI Mobility: bed rest Code Status: full Family Communication: Wife updated at bedside 4/30 am Disposition: ICU  Critical care x22 minutes  Erick Colace ACNP-BC [place] Pager # [phone] OR # [phone] if no answer

## 2019-08-14 ENCOUNTER — Inpatient Hospital Stay (HOSPITAL_COMMUNITY): Payer: BC Managed Care – PPO

## 2019-08-14 DIAGNOSIS — J939 Pneumothorax, unspecified: Secondary | ICD-10-CM

## 2019-08-14 DIAGNOSIS — J9602 Acute respiratory failure with hypercapnia: Secondary | ICD-10-CM

## 2019-08-14 LAB — CBC WITH DIFFERENTIAL/PLATELET
Abs Immature Granulocytes: 0.01 10*3/uL (ref 0.00–0.07)
Basophils Absolute: 0 10*3/uL (ref 0.0–0.1)
Basophils Relative: 0 %
Eosinophils Absolute: 0.5 10*3/uL (ref 0.0–0.5)
Eosinophils Relative: 7 %
HCT: 27.3 % — ABNORMAL LOW (ref 39.0–52.0)
Hemoglobin: 7.8 g/dL — ABNORMAL LOW (ref 13.0–17.0)
Immature Granulocytes: 0 %
Lymphocytes Relative: 39 %
Lymphs Abs: 2.4 10*3/uL (ref 0.7–4.0)
MCH: 30.7 pg (ref 26.0–34.0)
MCHC: 28.6 g/dL — ABNORMAL LOW (ref 30.0–36.0)
MCV: 107.5 fL — ABNORMAL HIGH (ref 80.0–100.0)
Monocytes Absolute: 0.5 10*3/uL (ref 0.1–1.0)
Monocytes Relative: 8 %
Neutro Abs: 2.9 10*3/uL (ref 1.7–7.7)
Neutrophils Relative %: 46 %
Platelets: 179 10*3/uL (ref 150–400)
RBC: 2.54 MIL/uL — ABNORMAL LOW (ref 4.22–5.81)
RDW: 17.2 % — ABNORMAL HIGH (ref 11.5–15.5)
WBC: 6.2 10*3/uL (ref 4.0–10.5)
nRBC: 0 % (ref 0.0–0.2)

## 2019-08-14 LAB — BASIC METABOLIC PANEL
Anion gap: 7 (ref 5–15)
BUN: 26 mg/dL — ABNORMAL HIGH (ref 8–23)
CO2: 45 mmol/L — ABNORMAL HIGH (ref 22–32)
Calcium: 8.8 mg/dL — ABNORMAL LOW (ref 8.9–10.3)
Chloride: 89 mmol/L — ABNORMAL LOW (ref 98–111)
Creatinine, Ser: <0.3 mg/dL — ABNORMAL LOW (ref 0.61–1.24)
Glucose, Bld: 122 mg/dL — ABNORMAL HIGH (ref 70–99)
Potassium: 3.9 mmol/L (ref 3.5–5.1)
Sodium: 141 mmol/L (ref 135–145)

## 2019-08-14 LAB — GLUCOSE, CAPILLARY
Glucose-Capillary: 121 mg/dL — ABNORMAL HIGH (ref 70–99)
Glucose-Capillary: 123 mg/dL — ABNORMAL HIGH (ref 70–99)
Glucose-Capillary: 150 mg/dL — ABNORMAL HIGH (ref 70–99)
Glucose-Capillary: 155 mg/dL — ABNORMAL HIGH (ref 70–99)
Glucose-Capillary: 158 mg/dL — ABNORMAL HIGH (ref 70–99)
Glucose-Capillary: 178 mg/dL — ABNORMAL HIGH (ref 70–99)
Glucose-Capillary: 203 mg/dL — ABNORMAL HIGH (ref 70–99)

## 2019-08-14 MED ORDER — HYDROCORTISONE NA SUCCINATE PF 100 MG IJ SOLR
50.0000 mg | Freq: Four times a day (QID) | INTRAMUSCULAR | Status: DC
  Administered 2019-08-14 – 2019-08-18 (×16): 50 mg via INTRAVENOUS
  Filled 2019-08-14 (×16): qty 2

## 2019-08-14 MED ORDER — FLUDROCORTISONE ACETATE 0.1 MG PO TABS
0.1000 mg | ORAL_TABLET | Freq: Every day | ORAL | Status: DC
  Administered 2019-08-14 – 2019-08-28 (×13): 0.1 mg
  Filled 2019-08-14 (×17): qty 1

## 2019-08-14 MED ORDER — OXYCODONE HCL 5 MG/5ML PO SOLN
5.0000 mg | Freq: Three times a day (TID) | ORAL | Status: DC
  Administered 2019-08-14 – 2019-08-28 (×34): 5 mg
  Filled 2019-08-14 (×34): qty 5

## 2019-08-14 NOTE — Plan of Care (Signed)
I met with Mr. Golebiewski' adult daughter and sister bedside to discuss his ongoing care and answer questions.  Continuing all care currently.  Julian Hy, DO 08/14/19 2:24 PM [facility]

## 2019-08-14 NOTE — Progress Notes (Signed)
eLink Physician-Brief Progress Note Patient Name: KALADIN NOSEWORTHY DOB: 1955-07-03 MRN: [id number]   Date of Service  08/14/2019  HPI/Events of Note  No AM lab orders.  eICU Interventions  Will order: 1. CBC with platelets and BMP now.      Intervention Category Major Interventions: Other:  [name] 08/14/2019, 5:54 AM

## 2019-08-14 NOTE — Progress Notes (Addendum)
NAME:  Steve [name], MRN:  [id number], DOB:  February 20, 1956, LOS: 11 ADMISSION DATE:  07/08/2019, CONSULTATION DATE:  3/29 REFERRING MD:  [name], CHIEF COMPLAINT:  dyspnea   Brief History   64 y/o male, with prior COVID infection 05/2019, admitted with severe acute respiratory failure with hypoxemia in the setting of receiving chemotherapy for Hodgkin's lymphoma. ICU course prolonged due to ARDS, malnutrition.    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 ARDS. Bilateral PTX s/p CTx2 4/19 Remains on vent 60%/PEEP 8, likely to need trach  4/20 trach placed 4/26 Worsening SQ air, CT with tension pneumomediastinum, chest tube suction increased 4/29 On neo 43mg's 4/30 Weaning neo 5/4 weaning phenylephrine profoundly weak 5/5 add solucortef. Dc sxn to 20 bilateral  Consults:  ID >> s/o 4/09 Oncology >> s/o 4/05  Procedures:  4/8 ETT > 4/20 4/20 Trach >> 4/9 bilateral chest tubes  Significant Diagnostic Tests:   CTA chest 3/29 > no PE, diffuse GGO upper lobes, patchy consolidation in bases, peripheral fibrotic changes noted on prior CT chest still present  ECHO 3/30 >LVEF 60 to 662% grade 1 diastolic dysfunction. Mildly dilated LA, normal RV. Trivial MR and TR,otherwise normal valves  CT Intraperitoneal air, likely extension of the pneumomediastinum/pneumothorax. An intra-abdominal etiology such as bowel perforation is favored less likely.Extensive pneumomediastinum and partially visualized right pneumothorax. Diarrheal state. Correlation with clinical exam and stool cultures recommended. No bowel obstruction. Normal appendix. Sigmoid diverticulosis  CT head 4/22 > NAICP, sub cutaneous air in  neck, bilateral mastoid effusions CT Chest w/o 4/26 >> tension pneumomediastinum, bilateral chest tubes in place, continued widespread pulmonary opacity with development of some bronchiectasis  Micro Data:  UC 3/29 >>negative  BCx2 3/29 >>negative  PJP 4/2 >>negative  Fungitel 3/29 >> positive Blastomyces Antigen 3/20 >> negative Respiratory Culture 4/4 >> staph epidermis >> S-vanco, clinda BAL 4/8 >>candida glabrata Fungal (BAL) 4/8 >> Candida glabrata PJP (BAL) 4/8 >>negative  Aspergillus antigen (BAL) 4/8 >> Negative Tracheal aspirate 4/29 >> negative  Antimicrobials:  Cefepime 3/29 >>3/31 Vanc 3/29 >>3/30 Azithromycin 3/29 >> 4/1 Fluconazole 3/29 >> 4/13 Meropenem 3/31 >> 4/6 Bactrim 3/31 >> 4/4  Clindamycin 4/5 >>4/20 Primaquine 4/5 >>4/20 Anidulafungin 4/12 >> 4/20  Bactrim (PCP prophylaxis)   Interim history/subjective:  No changes  Objective   Blood pressure 129/74, pulse 97, temperature 98.2 F (36.8 C), temperature source Axillary, resp. rate (Abnormal) 28, height _0  (1.676 m), weight 72 kg, SpO2 92 %.    Vent Mode: PRVC FiO2 (%):  [40 %-50 %] 50 % Set Rate:  [26 bmp] 26 bmp Vt Set:  [380 mL] 380 mL PEEP:  [5 cmH20] 5 cmH20 Plateau Pressure:  [20 cmH20-29 cmH20] 28 cmH20   Intake/Output Summary (Last 24 hours) at 08/14/2019 0854 Last data filed at 08/14/2019 0745 Gross per 24 hour  Intake 529.02 ml  Output 1620 ml  Net -1090.98 ml   Filed Weights   08/10/19 0500 08/13/19 0500 08/14/19 0416  Weight: 69.6 kg 72 kg 72 kg    Examination:  General: This is a 64year old male now chronically ill and ventilator/tracheostomy dependent.  He is in no distress, however continues to exhibit significant work of breathing even on full ventilatory support HEENT  temporal wasting noted, mucous membranes dry, tracheostomy unremarkable however requiring high cuff pressures to prevent air leak Pulmonary: Diminished bilaterally with accessory use noted even  on full ventilatory support.  He has bilateral chest tubes, there are no air leak noted Cardiac: Regular rate and rhythm without murmur rub or gallop Abdomen: Soft not tender no organomegaly Extremities: Warm and dry, decreased muscle bulk Neuro: Opens eyes, seems to track, moves upper extremities very weakly.  Profoundly deconditioned GU: Clear yellow.  Resolved Hospital Problem list   Candida glabrata in BAL PJP pneumonia, suspected, treated for 21 days Hypernatremia  Assessment & Plan:   Ventilator dependence w/ ARDS  ? Central apnea remains ventilator and trach dependent  -Profound deconditioning and severe malnutrition status post prolonged critical illness seem to be the primary barrier to ventilator liberation  plan Cont daily assessment for weaning Cont routine trach care. Will change to distal XLT today Am cxr Continue prophylactic Bactrim  Spontaneous pneumothoraces Tension Pneumomediastinum Sub cutaneous emphysema, peritoneal free air Portable chest x-ray: small residual right PTX (apical) can't see left ptx. Chest tubes good position. Diffuse bilateral airspace disease.  -No air leak noted on chest tubes Plan Dec bilateral ct sxn to 20 cmh20 If no change in cxr 5/6 dc sxn-->may be best to repeat CT imaging once off sxn x 24 hrs before removing CTs  Hypotension No fever spikes or worsening leukocytosis, no tachycardia not meeting SIRS parameters.  Suspect this is drug-related, although also would be concerned about underlying adrenal function w/ random cortisol 15.3 ->initially this was from tamponade physiology from his pneumomediastinum Plan Cont midodrine Cont neo-->titrate for SBP >100 Will add solucortef to see if this helps  Acute metabolic encephalopathy , improving Need for sedation for mechanical ventilation Fentanyl tachyphylaxis Plan PRN dilaudid  Add low dose oxy  Anemia of chronic illness Mild Thrombocytopenia  Hodgkin lymphoma Plan Intermittent  CBC  Hyperlipidemia Plan Cont statin   Severe protein calorie malnutrition Plan Cont wound care   Physical deconditioning Plan Will need extensive PT-->suspect will be prolonged rehab effort   Sacral Decubitus  Plan Cont wound care   Goals of care -appreciate prior discussion with palliative care, family wishes for full scope.  They have young children (63 & 63 y/o)   Best practice:  Diet: tube feeding Pain/Anxiety/Delirium protocol (if indicated): as above VAP protocol (if indicated): yes DVT prophylaxis: lovenox GI prophylaxis: PPI Glucose control: SSI Mobility: bed rest Code Status: full Family Communication: Wife updated at bedside 4/30 am Disposition: He will eventually go to LTAC, however need to get chest tubes out prior to this.  My critical care time 22 min   Erick Colace ACNP-BC [place] Pager # [phone] OR # [phone] if no answer

## 2019-08-15 ENCOUNTER — Inpatient Hospital Stay (HOSPITAL_COMMUNITY): Payer: BC Managed Care – PPO

## 2019-08-15 LAB — BASIC METABOLIC PANEL
Anion gap: 7 (ref 5–15)
BUN: 28 mg/dL — ABNORMAL HIGH (ref 8–23)
CO2: 45 mmol/L — ABNORMAL HIGH (ref 22–32)
Calcium: 8.8 mg/dL — ABNORMAL LOW (ref 8.9–10.3)
Chloride: 88 mmol/L — ABNORMAL LOW (ref 98–111)
Creatinine, Ser: <0.3 mg/dL — ABNORMAL LOW (ref 0.61–1.24)
Glucose, Bld: 175 mg/dL — ABNORMAL HIGH (ref 70–99)
Potassium: 4.5 mmol/L (ref 3.5–5.1)
Sodium: 140 mmol/L (ref 135–145)

## 2019-08-15 LAB — GLUCOSE, CAPILLARY
Glucose-Capillary: 158 mg/dL — ABNORMAL HIGH (ref 70–99)
Glucose-Capillary: 159 mg/dL — ABNORMAL HIGH (ref 70–99)
Glucose-Capillary: 169 mg/dL — ABNORMAL HIGH (ref 70–99)
Glucose-Capillary: 175 mg/dL — ABNORMAL HIGH (ref 70–99)
Glucose-Capillary: 177 mg/dL — ABNORMAL HIGH (ref 70–99)
Glucose-Capillary: 202 mg/dL — ABNORMAL HIGH (ref 70–99)

## 2019-08-15 LAB — CBC WITH DIFFERENTIAL/PLATELET
Abs Immature Granulocytes: 0.01 10*3/uL (ref 0.00–0.07)
Basophils Absolute: 0 10*3/uL (ref 0.0–0.1)
Basophils Relative: 0 %
Eosinophils Absolute: 0 10*3/uL (ref 0.0–0.5)
Eosinophils Relative: 0 %
HCT: 26.7 % — ABNORMAL LOW (ref 39.0–52.0)
Hemoglobin: 7.7 g/dL — ABNORMAL LOW (ref 13.0–17.0)
Immature Granulocytes: 0 %
Lymphocytes Relative: 33 %
Lymphs Abs: 1.3 10*3/uL (ref 0.7–4.0)
MCH: 30.6 pg (ref 26.0–34.0)
MCHC: 28.8 g/dL — ABNORMAL LOW (ref 30.0–36.0)
MCV: 106 fL — ABNORMAL HIGH (ref 80.0–100.0)
Monocytes Absolute: 0.1 10*3/uL (ref 0.1–1.0)
Monocytes Relative: 3 %
Neutro Abs: 2.5 10*3/uL (ref 1.7–7.7)
Neutrophils Relative %: 64 %
Platelets: 164 10*3/uL (ref 150–400)
RBC: 2.52 MIL/uL — ABNORMAL LOW (ref 4.22–5.81)
RDW: 17 % — ABNORMAL HIGH (ref 11.5–15.5)
WBC: 4 10*3/uL (ref 4.0–10.5)
nRBC: 0 % (ref 0.0–0.2)

## 2019-08-15 MED ORDER — FUROSEMIDE 10 MG/ML IJ SOLN
40.0000 mg | Freq: Once | INTRAMUSCULAR | Status: AC
  Administered 2019-08-15: 13:00:00 40 mg via INTRAVENOUS
  Filled 2019-08-15: qty 4

## 2019-08-15 NOTE — Progress Notes (Signed)
NAME:  Steve [name], MRN:  [id number], DOB:  1956/02/11, LOS: 36 ADMISSION DATE:  07/08/2019, CONSULTATION DATE:  3/29 REFERRING MD:  Steve [name], CHIEF COMPLAINT:  dyspnea   Brief History   64 y/o male, with prior COVID infection 05/2019, admitted with severe acute respiratory failure with hypoxemia in the setting of receiving chemotherapy for Hodgkin's lymphoma. ICU course prolonged due to ARDS, malnutrition.    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 ARDS. Bilateral PTX s/p CTx2 4/19 Remains on vent 60%/PEEP 8, likely to need trach  4/20 trach placed 4/26 Worsening SQ air, CT with tension pneumomediastinum, chest tube suction increased 4/29 On neo 31mg's 4/30 Weaning neo 5/4 weaning phenylephrine profoundly weak 5/5 add solucortef. Dc sxn to 20 bilateral desaturated, suction increased, but not clear that pneumo was actually worse 5/6: Suction once again reduced Consults:  ID >> s/o 4/09 Oncology >> s/o 4/05  Procedures:  4/8 ETT > 4/20 4/20 Trach >> 4/9 bilateral chest tubes  Significant Diagnostic Tests:   CTA chest 3/29 > no PE, diffuse GGO upper lobes, patchy consolidation in bases, peripheral fibrotic changes noted on prior CT chest still present  ECHO 3/30 >LVEF 60 to 673% grade 1 diastolic dysfunction. Mildly dilated LA, normal RV. Trivial MR and TR,otherwise normal valves  CT Intraperitoneal air, likely extension of the pneumomediastinum/pneumothorax. An intra-abdominal etiology such as bowel perforation is favored less likely.Extensive pneumomediastinum and partially visualized right pneumothorax. Diarrheal state. Correlation with clinical exam and stool cultures recommended.  No bowel obstruction. Normal appendix. Sigmoid diverticulosis  CT head 4/22 > NAICP, sub cutaneous air in neck, bilateral mastoid effusions CT Chest w/o 4/26 >> tension pneumomediastinum, bilateral chest tubes in place, continued widespread pulmonary opacity with development of some bronchiectasis  Micro Data:  UC 3/29 >>negative  BCx2 3/29 >>negative  PJP 4/2 >>negative  Fungitel 3/29 >> positive Blastomyces Antigen 3/20 >> negative Respiratory Culture 4/4 >> staph epidermis >> S-vanco, clinda BAL 4/8 >>candida glabrata Fungal (BAL) 4/8 >> Candida glabrata PJP (BAL) 4/8 >>negative  Aspergillus antigen (BAL) 4/8 >> Negative Tracheal aspirate 4/29 >> negative  Antimicrobials:  Cefepime 3/29 >>3/31 Vanc 3/29 >>3/30 Azithromycin 3/29 >> 4/1 Fluconazole 3/29 >> 4/13 Meropenem 3/31 >> 4/6 Bactrim 3/31 >> 4/4  Clindamycin 4/5 >>4/20 Primaquine 4/5 >>4/20 Anidulafungin 4/12 >> 4/20  Bactrim (PCP prophylaxis)   Interim history/subjective:  Desaturated last night.  Looks extremely uncomfortable, labored even on full ventilatory support.  I think he is suffering  Objective   Blood pressure 135/65, pulse (Abnormal) 103, temperature 98.8 F (37.1 C), temperature source Axillary, resp. rate (Abnormal) 27, height _0  (1.676 m), weight 73.4 kg, SpO2 99 %.    Vent Mode: PRVC FiO2 (%):  [50 %-60 %] 60 % Set Rate:  [26 bmp] 26 bmp Vt Set:  [380 mL] 380 mL PEEP:  [5 cmH20] 5 cmH20 Plateau Pressure:  [21 cmH20-28 cmH20] 21 cmH20   Intake/Output Summary (Last 24 hours) at 08/15/2019 0917 Last data filed at 08/15/2019 0600 Gross per 24 hour  Intake 3165.67 ml  Output 1425 ml  Net 1740.67 ml   Filed Weights   08/13/19 0500 08/14/19 0416 08/15/19 0347  Weight: 72 kg 72 kg 73.4 kg    Examination:  General this is a chronically critically ill 64 year old white male who remains on full ventilatory support HEENT normocephalic atraumatic he does demonstrate temporal wasting.   He has a size 6 cuffed tracheostomy in place this is unremarkable with the exception that he does require high cuff pressures to prevent air leak Pulmonary: Marked accessory use even on full ventilatory support, his FiO2 is now up to 60%.  There is no air leak noted from his bilateral chest tubes Cardiac: Regular rate and rhythm Abdomen soft not tender Extremities warm and dry Neuro opens eyes, currently sedated, profoundly weak GU clear yellow Resolved Hospital Problem list   Candida glabrata in BAL PJP pneumonia, suspected, treated for 21 days Hypernatremia  Assessment & Plan:   Ventilator dependence w/ ARDS  ? Central apnea remains ventilator and trach dependent  -Profound deconditioning and severe malnutrition status post prolonged critical illness seem to be the primary barrier to ventilator liberation, I do not think he will come off the ventilator plan Continue full ventilatory support, need to bring FiO2 down once again  We can try to assess for weaning once his FiO2 is back down however I do not think he will be liberated from the ventilator  Once his FiO2 is down again we will change him to a distal XLT trach  Continue prophylactic Bactrim  Continue to discuss goals of care with family  Will try dose of Lasix today  Spontaneous pneumothoraces Tension Pneumomediastinum Sub cutaneous emphysema, peritoneal free air Portable chest x-ray was personally reviewed: This demonstrates Ongoing persistent diffuse bilateral airspace disease.  There is no definitive pneumothorax appreciated There is no air leak noted on chest tubes Plan We will decrease chest tube back to 20 cm suction  A.m. chest x-ray   Hypotension Etiology likely multifactorial, medication related, relative adrenal insufficiency from prolonged critical illness Plan Continue midodrine Continue to wean phenylephrine for systolic blood pressure greater than 100 Stress dose steroids started 5/5   Acute metabolic  encephalopathy , improving Need for sedation for mechanical ventilation Fentanyl tachyphylaxis Plan Added low-dose via tube narcotics Continue as needed Dilaudid  Anemia of chronic illness Mild Thrombocytopenia  Hodgkin lymphoma Plan Intermittent CBC  Hyperlipidemia Plan Continue statin  Severe protein calorie malnutrition Plan Continue tube feeds  Physical deconditioning Plan If we can never improve his respiratory status he will need prolonged physical therapy  Sacral Decubitus  Plan Continue wound care  Goals of care -appreciate prior discussion with palliative care, family wishes for full scope.  They have young children (36 & 39 y/o)   Best practice:  Diet: tube feeding Pain/Anxiety/Delirium protocol (if indicated): as above VAP protocol (if indicated): yes DVT prophylaxis: lovenox GI prophylaxis: PPI Glucose control: SSI Mobility: bed rest Code Status: full Family Communication: Wife updated at bedside 4/30 am Disposition: He will eventually go to LTAC, however need to get chest tubes out prior to this. My critical care time is 22 minutes  Erick Colace ACNP-BC [place] Pager # [phone] OR # [phone] if no answer

## 2019-08-15 NOTE — Progress Notes (Signed)
eLink Physician-Brief Progress Note Patient Name: Steve [name] DOB: 07-17-1955 MRN: [id number]   Date of Service  08/15/2019  HPI/Events of Note  No AM labs ordered.   eICU Interventions  Will order CBC with platelets and BMP at 5 AM.     Intervention Category Major Interventions: Other:  Merrel Crabbe Cornelia Copa 08/15/2019, 1:50 AM

## 2019-08-15 NOTE — Evaluation (Signed)
Physical Therapy Evaluation Patient Details Name: Steve [name] MRN: [id number] DOB: [birth year] Today's Date: 08/15/2019   History of Present Illness  64 y/o gentleman with Hodgkin's lymphoma, previous Covid who has severe bilateral pneumonia vs pneumonitis with vent-dependent respiratory failure and pneuthoraces with pneumomediastinum.  Clinical Impression  The patient does not respond to verbal commands, ? Turned eyes towards speaker. Patient  Doe not demnstrate volitional movement of legs, did turn and move head towards right x 2 spontaneous. Patient presents with VDRF. Prognosis for functional recovery is very limited. Will follow next week.    Follow Up Recommendations LTACH    Equipment Recommendations  None recommended by PT    Recommendations for Other Services       Precautions / Restrictions Precautions Precautions: Fall Precaution Comments: vent, trach, bilateral chest tubes, FMS, peg tube, mits Restrictions Weight Bearing Restrictions: No      Mobility  Bed Mobility Overal bed mobility: Needs Assistance Bed Mobility: Rolling Rolling: Total assist;+2 for physical assistance;+2 for safety/equipment            Transfers                 General transfer comment: deferred   Ambulation/Gait                Stairs            Wheelchair Mobility    Modified Rankin (Stroke Patients Only)       Balance Overall balance assessment: (not tested)                                           Pertinent Vitals/Pain Pain Assessment: Faces Faces Pain Scale: No hurt    Home Living Family/patient expects to be discharged to:: Unsure Living Arrangements: Spouse/significant other               Additional Comments: no family present at this time and patient unable to communication, prolonged hospitalization with significant deconditioning    Prior Function Level of Independence: Independent         Comments: per  chart     Hand Dominance   Dominant Hand: Right    Extremity/Trunk Assessment   Upper Extremity Assessment Upper Extremity Assessment: Defer to OT evaluation    Lower Extremity Assessment Lower Extremity Assessment: RLE deficits/detail;LLE deficits/detail RLE Deficits / Details: flaccid with noted hip flexion tightness, noted lateral scar. LLE Deficits / Details: flaccid, not tone , not active movement, no withdrawal       Communication   Communication: Expressive difficulties;Tracheostomy;Other (comment)  Cognition Arousal/Alertness: Awake/alert Behavior During Therapy: Flat affect Overall Cognitive Status: Difficult to assess                                 General Comments: eyes open , Question if looks toward verbal stimultion, no Following commands,      General Comments      Exercises General Exercises - Upper Extremity Shoulder Flexion: PROM;Both;5 reps;Prone Elbow Flexion: PROM;Both;5 reps;Supine Elbow Extension: PROM;Both;5 reps;Supine Digit Composite Flexion: PROM;Both;5 reps;Supine Composite Extension: PROM;Both;5 reps;Supine   Assessment/Plan    PT Assessment Patient needs continued PT services(trial)  PT Problem List Decreased strength;Decreased balance;Decreased cognition;Decreased knowledge of precautions;Decreased range of motion;Decreased mobility;Decreased activity tolerance;Cardiopulmonary status limiting activity       PT Treatment Interventions  Functional mobility training;Therapeutic activities;Therapeutic exercise;Neuromuscular re-education    PT Goals (Current goals can be found in the Care Plan section)  Acute Rehab PT Goals PT Goal Formulation: Patient unable to participate in goal setting Time For Goal Achievement: 08/29/19 Potential to Achieve Goals: Poor    Frequency Min 1X/week   Barriers to discharge        Co-evaluation   Reason for Co-Treatment: Complexity of the patient's impairments (multi-system  involvement);Necessary to address cognition/behavior during functional activity;For patient/therapist safety;To address functional/ADL transfers   OT goals addressed during session: Strengthening/ROM;ADL's and self-care       AM-PAC PT "6 Clicks" Mobility  Outcome Measure Help needed turning from your back to your side while in a flat bed without using bedrails?: Total Help needed moving from lying on your back to sitting on the side of a flat bed without using bedrails?: Total Help needed moving to and from a bed to a chair (including a wheelchair)?: Total Help needed standing up from a chair using your arms (e.g., wheelchair or bedside chair)?: Total Help needed to walk in hospital room?: Total Help needed climbing 3-5 steps with a railing? : Total 6 Click Score: 6    End of Session Equipment Utilized During Treatment: (vent) Activity Tolerance: Patient limited by lethargy Patient left: in bed Nurse Communication: Need for lift equipment;Mobility status PT Visit Diagnosis: Muscle weakness (generalized) (M62.81);Other symptoms and signs involving the nervous system (R29.898)    Time: 6578-4696 PT Time Calculation (min) (ACUTE ONLY): 24 min   Charges:   PT Evaluation $PT Eval Moderate Complexity: [place] Pager [phone] Office [phone]   Claretha Cooper 08/15/2019, 2:04 PM

## 2019-08-15 NOTE — TOC Progression Note (Signed)
Transition of Care [facility]) - Progression Note    Patient Details  Name: Steve [name] MRN: [id number] Date of Birth: 03/11/1956  [facility]) CM/SW Contact  Leeroy Cha, RN Phone Number: 08/15/2019, 11:42 AM  Clinical Narrative:    Continues to require trache and vent at 60% desats to 86%, iv solu cortef, iv phenylephrine/bun-28 creat <0.30, GRF not calculated, hgb=7.7 pt is awake responds broadly to pain stimuli , does not follow commands, no sedation.  Has bilateral chest tubes to suction.  Family is aware of status and condition.  Hx of hodgkins may be playing a part in the recovery.  Prognosis is poor.  Expected Discharge Plan: Long Term Acute Care (LTAC) Barriers to Discharge: Continued Medical Work up  Expected Discharge Plan and Services Expected Discharge Plan: [place] (LTAC)       Living arrangements for the past 2 months: Single Family Home                                       Social Determinants of Health (SDOH) Interventions    Readmission Risk Interventions Readmission Risk Prevention Plan 07/22/2019  Transportation Screening Complete  Medication Review Press photographer) Complete  Some recent data might be hidden

## 2019-08-15 NOTE — Evaluation (Signed)
Occupational Therapy Evaluation Patient Details Name: Steve [name] MRN: [id number] DOB: 01-22-1956 Today's Date: 08/15/2019    History of Present Illness 63 y/o gentleman with Hodgkin's lymphoma, previous Covid who has severe bilateral pneumonia vs pneumonitis with vent-dependent respiratory failure and pneuthoraces with pneumomediastinum.   Clinical Impression   Patient with significant deconditioning due to prolonged hospitalization and medical complications. Patient was unable to follow 1 step directions, visually attend to stimuli or participate in there ex. Patient was total A x2-3 for rolling and peri care at bed level. Currently patient is not appropriate for skilled OT services due to cognitive impairments/ inability to participate, will discontinue services.              Precautions / Restrictions Precautions Precautions: Fall Precaution Comments: vent, trach, bilateral chest tubes, FMS, peg tube, mits Restrictions Weight Bearing Restrictions: No      Mobility Bed Mobility Overal bed mobility: Needs Assistance Bed Mobility: Rolling Rolling: Total assist;+2 for physical assistance;+2 for safety/equipment            Transfers                 General transfer comment: deferred     Balance Overall balance assessment: (not tested)                                         ADL either performed or assessed with clinical judgement   ADL Overall ADL's : Needs assistance/impaired Eating/Feeding: NPO   Grooming: Total assistance   Upper Body Bathing: Total assistance   Lower Body Bathing: Total assistance   Upper Body Dressing : Total assistance   Lower Body Dressing: Total assistance   Toilet Transfer: Total assistance   Toileting- Clothing Manipulation and Hygiene: Total assistance       Functional mobility during ADLs: Total assistance;+2 for physical assistance;+2 for safety/equipment General ADL Comments: patient is unable to  follow directions or initiate purposeful movement, total A x2-3 for rolling and peri care                   Pertinent Vitals/Pain Pain Assessment: Faces Faces Pain Scale: No hurt     Hand Dominance Right   Extremity/Trunk Assessment Upper Extremity Assessment Upper Extremity Assessment: Generalized weakness   Lower Extremity Assessment Lower Extremity Assessment: Defer to PT evaluation       Communication Communication Communication: Expressive difficulties;Tracheostomy;Other (comment)(vent)   Cognition Arousal/Alertness: Awake/alert Behavior During Therapy: Flat affect Overall Cognitive Status: Difficult to assess                                 General Comments: patient's eyes open however unable to visually attend to stimulus   General Comments       Exercises Exercises: General Upper Extremity General Exercises - Upper Extremity Shoulder Flexion: PROM;Both;5 reps;Prone Elbow Flexion: PROM;Both;5 reps;Supine Elbow Extension: PROM;Both;5 reps;Supine Digit Composite Flexion: PROM;Both;5 reps;Supine Composite Extension: PROM;Both;5 reps;Supine   Shoulder Instructions      Home Living Family/patient expects to be discharged to:: Unsure                                 Additional Comments: no family present at this time and patient unable to communication, prolonged hospitalization with significant deconditioning  Prior Functioning/Environment Level of Independence: Independent        Comments: per chart        OT Problem List: Decreased strength;Decreased activity tolerance;Impaired UE functional use;Cardiopulmonary status limiting activity                       Co-evaluation PT/OT/SLP Co-Evaluation/Treatment: Yes Reason for Co-Treatment: Complexity of the patient's impairments (multi-system involvement);Necessary to address cognition/behavior during functional activity;For patient/therapist safety;To address  functional/ADL transfers   OT goals addressed during session: Strengthening/ROM;ADL's and self-care      AM-PAC OT "6 Clicks" Daily Activity     Outcome Measure Help from another person eating meals?: Total Help from another person taking care of personal grooming?: Total Help from another person toileting, which includes using toliet, bedpan, or urinal?: Total Help from another person bathing (including washing, rinsing, drying)?: Total Help from another person to put on and taking off regular upper body clothing?: Total Help from another person to put on and taking off regular lower body clothing?: Total 6 Click Score: 6   End of Session Equipment Utilized During Treatment: Oxygen Nurse Communication: Mobility status  Activity Tolerance: Other (comment)(patient unable to participate) Patient left: in bed;with call bell/phone within reach  OT Visit Diagnosis: Other abnormalities of gait and mobility (R26.89);Muscle weakness (generalized) (M62.81)                Time: 1000-1023 OT Time Calculation (min): 23 min Charges:  OT General Charges $OT Visit: 1 Visit OT Evaluation $OT Eval Moderate Complexity: [place] OT Pager: [place] 08/15/2019, 1:48 PM

## 2019-08-16 ENCOUNTER — Inpatient Hospital Stay (HOSPITAL_COMMUNITY): Payer: BC Managed Care – PPO

## 2019-08-16 DIAGNOSIS — E43 Unspecified severe protein-calorie malnutrition: Secondary | ICD-10-CM

## 2019-08-16 LAB — GLUCOSE, CAPILLARY
Glucose-Capillary: 135 mg/dL — ABNORMAL HIGH (ref 70–99)
Glucose-Capillary: 129 mg/dL — ABNORMAL HIGH (ref 70–99)
Glucose-Capillary: 193 mg/dL — ABNORMAL HIGH (ref 70–99)
Glucose-Capillary: 163 mg/dL — ABNORMAL HIGH (ref 70–99)
Glucose-Capillary: 166 mg/dL — ABNORMAL HIGH (ref 70–99)

## 2019-08-16 MED ORDER — MIDODRINE HCL 5 MG PO TABS
5.0000 mg | ORAL_TABLET | Freq: Three times a day (TID) | ORAL | Status: DC
  Administered 2019-08-16 – 2019-08-20 (×8): 5 mg via ORAL
  Filled 2019-08-16 (×7): qty 1

## 2019-08-16 MED ORDER — LOPERAMIDE HCL 1 MG/7.5ML PO SUSP
2.0000 mg | Freq: Three times a day (TID) | ORAL | Status: DC | PRN
  Administered 2019-08-16 – 2019-08-27 (×6): 2 mg
  Filled 2019-08-16 (×9): qty 15

## 2019-08-16 MED ORDER — FUROSEMIDE 10 MG/ML IJ SOLN
40.0000 mg | Freq: Once | INTRAMUSCULAR | Status: AC
  Administered 2019-08-16: 12:00:00 40 mg via INTRAVENOUS
  Filled 2019-08-16: qty 4

## 2019-08-16 MED ORDER — POTASSIUM CHLORIDE 20 MEQ/15ML (10%) PO SOLN
40.0000 meq | Freq: Once | ORAL | Status: AC
  Administered 2019-08-16: 12:00:00 40 meq
  Filled 2019-08-16: qty 30

## 2019-08-16 NOTE — Progress Notes (Signed)
NAME:  Steve [name], MRN:  [id number], DOB:  18-Feb-1956, LOS: 86 ADMISSION DATE:  07/08/2019, CONSULTATION DATE:  3/29 REFERRING MD:  [name], CHIEF COMPLAINT:  dyspnea   Brief History   64 y/o male, with prior COVID infection 05/2019, admitted with severe acute respiratory failure with hypoxemia in the setting of receiving chemotherapy for Hodgkin's lymphoma. ICU course prolonged due to ARDS, malnutrition.    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 ARDS. Bilateral PTX s/p CTx2 4/19 Remains on vent 60%/PEEP 8, likely to need trach  4/20 trach placed 4/26 Worsening SQ air, CT with tension pneumomediastinum, chest tube suction increased 4/29 On neo 83mg's 4/30 Weaning neo 5/4 weaning phenylephrine profoundly weak 5/5 add solucortef. Dc sxn to 20 bilateral desaturated, suction increased, but not clear that pneumo was actually worse 5/6: Suction once again reduced.  Stress dose steroids started, phenylephrine discontinued 5/7 chest tubes changed to waterseal, able to titrate midodrine down Consults:  ID >> s/o 4/09 Oncology >> s/o 4/05  Procedures:  4/8 ETT > 4/20 4/20 Trach >> 4/9 bilateral chest tubes  Significant Diagnostic Tests:   CTA chest 3/29 > no PE, diffuse GGO upper lobes, patchy consolidation in bases, peripheral fibrotic changes noted on prior CT chest still present  ECHO 3/30 >LVEF 60 to 685% grade 1 diastolic dysfunction. Mildly dilated LA, normal RV. Trivial MR and TR,otherwise normal valves  CT Intraperitoneal air, likely extension of the pneumomediastinum/pneumothorax. An intra-abdominal etiology such as bowel perforation is favored less likely.Extensive  pneumomediastinum and partially visualized right pneumothorax. Diarrheal state. Correlation with clinical exam and stool cultures recommended. No bowel obstruction. Normal appendix. Sigmoid diverticulosis  CT head 4/22 > NAICP, sub cutaneous air in neck, bilateral mastoid effusions CT Chest w/o 4/26 >> tension pneumomediastinum, bilateral chest tubes in place, continued widespread pulmonary opacity with development of some bronchiectasis  Micro Data:  UC 3/29 >>negative  BCx2 3/29 >>negative  PJP 4/2 >>negative  Fungitel 3/29 >> positive Blastomyces Antigen 3/20 >> negative Respiratory Culture 4/4 >> staph epidermis >> S-vanco, clinda BAL 4/8 >>candida glabrata Fungal (BAL) 4/8 >> Candida glabrata PJP (BAL) 4/8 >>negative  Aspergillus antigen (BAL) 4/8 >> Negative Tracheal aspirate 4/29 >> negative  Antimicrobials:  Cefepime 3/29 >>3/31 Vanc 3/29 >>3/30 Azithromycin 3/29 >> 4/1 Fluconazole 3/29 >> 4/13 Meropenem 3/31 >> 4/6 Bactrim 3/31 >> 4/4  Clindamycin 4/5 >>4/20 Primaquine 4/5 >>4/20 Anidulafungin 4/12 >> 4/20  Bactrim (PCP prophylaxis)   Interim history/subjective:  No changes  Objective   Blood pressure (Abnormal) 141/79, pulse 90, temperature 98.3 F (36.8 C), temperature source Axillary, resp. rate (Abnormal) 32, height _0  (1.676 m), weight 70.2 kg, SpO2 100 %.    Vent Mode: PRVC FiO2 (%):  [55 %] 55 % Set Rate:  [26 bmp] 26 bmp Vt Set:  [380 mL] 380 mL PEEP:  [5 cmH20] 5 cmH20 Plateau Pressure:  [22 cmH20-24 cmH20] 23 cmH20   Intake/Output Summary (Last 24 hours) at 08/16/2019 0925 Last data filed at 08/16/2019 0857 Gross per 24 hour  Intake 2391 ml  Output 2730 ml  Net -339 ml   Filed Weights   08/14/19 0416 08/15/19 0347 08/16/19 0437  Weight: 72 kg 73.4 kg 70.2 kg    Examination:  General this is a cachectic malnourished 63 year old white male who remains on full ventilatory support Temporal wasting mucous membranes dry size 6 cuffed  tracheostomy is unremarkable Pulmonary: Marked accessory use, tachypneic, crackles bases, Cardiac regular rate and rhythm Neuro awake, moving upper extremities spontaneously, intermittently attempting to communicate, diffusely weak Extremities warm, dry, dependent edema brisk capillary refill GU clear yellow Abdomen: Soft, liquid stools via [facility] Problem list   Candida glabrata in BAL PJP pneumonia, suspected, treated for 21 days Hypernatremia  Assessment & Plan:   Ventilator dependence w/ ARDS  ? Central apnea remains ventilator and trach dependent  -Profound deconditioning and severe malnutrition status post prolonged critical illness seem to be the primary barrier to ventilator liberation, I do not think he will come off the ventilator plan Continue full ventilatory support  Assess for readiness to wean daily  Will eventually need to be changed to a distal XLT trach, however would like to see his FiO2 requirements down to less than 40%  Continue IV Lasix as BP BUN/creatinine tolerate  Continuing prophylactic Bactrim  Continue to readdress goals of care, I think family needs to be prepared that he may not come off ventilator   Spontaneous pneumothoraces Tension Pneumomediastinum Sub cutaneous emphysema, peritoneal free air Portable chest x-ray was personally reviewed: There is slight improvement in aeration particularly on the right side no clear pneumothorax noted  No air leak via chest tubes  plan Change chest tubes to waterseal Repeat a.m. chest x-ray If stable over weekend needs CT of chest either Sunday or Monday, if no pneumothorax at that point by CT scan I think we can discontinue chest tubes  Hypotension; seemingly responding to treatment of relative adrenal insufficiency Etiology likely multifactorial, medication related, relative adrenal insufficiency from prolonged critical illness.  Blood pressure remarkably improved after adding stress dose  steroids, now off from phenylephrine Plan Decrease midodrine to 5 mg 3 times daily Continue Solu-Cortef day #2 and Florinef May need maintenance dosing of Solu-Cortef   Acute metabolic encephalopathy , improving Need for sedation for mechanical ventilation Fentanyl tachyphylaxis Plan Low-dose narcotics via tube and add as needed Dilaudid  Anemia of chronic illness Mild Thrombocytopenia  Hodgkin lymphoma Plan Intermittent CBC  Hyperlipidemia Plan Statin  Severe protein calorie malnutrition Plan Continue tube feeds  Physical deconditioning Plan We will need prolonged physical therapy  Sacral Decubitus  Plan Continue wound care  Goals of care -appreciate prior discussion with palliative care, family wishes for full scope.  They have young children (83 & 33 y/o)   Best practice:  Diet: tube feeding Pain/Anxiety/Delirium protocol (if indicated): as above VAP protocol (if indicated): yes DVT prophylaxis: lovenox GI prophylaxis: PPI Glucose control: SSI Mobility: bed rest Code Status: full Family Communication: Wife updated at bedside 4/30 am Disposition: He will eventually go to LTAC, however need to get chest tubes out prior to this. My critical care time is 22 minutes  Erick Colace ACNP-BC [place] Pager # [phone] OR # [phone] if no answer

## 2019-08-16 NOTE — Progress Notes (Signed)
Nutrition Follow-up  DOCUMENTATION CODES:   Non-severe (moderate) malnutrition in context of chronic illness  INTERVENTION:  Continue Vital AF 1.2 @ 45 ml/hr with 30 ml prostat TID and 1 packet juven BID. - this regimen is providing 1786 kcal  131 grams protein, and 876 ml free water. - free water flush per MD/NP (currently 200 ml every 4 hours).    NUTRITION DIAGNOSIS:   Moderate Malnutrition related to chronic illness, cancer and cancer related treatments as evidenced by mild fat depletion, mild muscle depletion, percent weight loss. ongoing    GOAL:   Patient will meet greater than or equal to 90% of their needs met with TF regimen    MONITOR:   Vent status, TF tolerance, Labs, Weight trends, Skin  REASON FOR ASSESSMENT:   Ventilator, Consult Calorie Count, Enteral/tube feeding initiation and management  ASSESSMENT:  65 year-old male with a history of Hodgkin's lymphoma (stage 3, diagnosed 02/2019) on chemotherapy. He follows with Dr. Maylon Peppers in Oncology. His most recent treatment was on 3/8. He presented to the ED with worsening respiratory status and inability to tolerate PO intake recently. He had his chemo delayed this week. He has a history of neutropenic fever in the past. He had a good response to chemo on his first follow up PET scan. His appetite has been poor and he has been receiving IV fluids.  Significant Events: 3/29- admission 3/30- initial RD assessment 4/8- intubation and OGT placement 4/20- tracheostomy; OGT removed and small bore NGT placed in R nare 5/2- small bore NGT replaced in R nare 5/7-chest tubes changed to waterseal, titrate midodrine down  Patient remains intubated via trach with small bore NGT in R nare and he is receiving Vital 1.2 @ 45 ml/hr with 30 ml prostat BID, 1 packet juven BIDwith 200 ml free water flushes every 4 hours  Weights have been fairly stable over the past 4 days. Non-pitting BUE edema per RN assessment.  Per  notes: -continue full vent support, eventual need to change to distal XLT tach -readdress GOC, may not come off vent -BP improved s/p adding stress dose steroids  Patient is currently intubated on ventilator support MV: 11.5 L/min Temp (24hrs), Avg:98.6 F (37 C), Min:98.1 F (36.7 C), Max:99.2 F (37.3 C) Propofol: none  Medications reviewed and include: SSI, lantus, ferrous sulfate, florinef, solu-cortef, MVI, protonix, oxycodone Labs: CBGs 193,129,135,177,202,158 x 24 hrs  Diet Order:   Diet Order    None      EDUCATION NEEDS:   No education needs have been identified at this time  Skin:  Skin Assessment: Skin Integrity Issues: Skin Integrity Issues:: Stage I, Unstageable DTI: sacrum (4/15) Stage I: R heel (4/19) Unstageable: full thickness to lip (4/15) and sacrum (4/26)  Last BM:  5/7 (250 ml - rectal tube)  Height:   Ht Readings from Last 1 Encounters:  08/10/19 5' 6"  (1.676 m)    Weight:   Wt Readings from Last 1 Encounters:  08/16/19 70.2 kg    Ideal Body Weight:  64.5 kg  BMI:  Body mass index is 24.98 kg/m.  Estimated Nutritional Needs:   Kcal:  1720 kcal  Protein:  125-139 grams (1.8-2 grams/kg)  Fluid:  >/= 2 L/day   Lajuan Lines, RD, LDN Clinical Nutrition After Hours/Weekend Pager # in [place]

## 2019-08-17 ENCOUNTER — Inpatient Hospital Stay (HOSPITAL_COMMUNITY): Payer: BC Managed Care – PPO

## 2019-08-17 LAB — BLOOD GAS, ARTERIAL
Acid-Base Excess: 25.5 mmol/L — ABNORMAL HIGH (ref 0.0–2.0)
Bicarbonate: 52.5 mmol/L — ABNORMAL HIGH (ref 20.0–28.0)
FIO2: 50
O2 Saturation: 91 %
Patient temperature: 98.6
pCO2 arterial: 68.5 mmHg (ref 32.0–48.0)
pH, Arterial: 7.497 — ABNORMAL HIGH (ref 7.350–7.450)
pO2, Arterial: 60.7 mmHg — ABNORMAL LOW (ref 83.0–108.0)

## 2019-08-17 LAB — GLUCOSE, CAPILLARY
Glucose-Capillary: 126 mg/dL — ABNORMAL HIGH (ref 70–99)
Glucose-Capillary: 138 mg/dL — ABNORMAL HIGH (ref 70–99)
Glucose-Capillary: 173 mg/dL — ABNORMAL HIGH (ref 70–99)
Glucose-Capillary: 174 mg/dL — ABNORMAL HIGH (ref 70–99)
Glucose-Capillary: 175 mg/dL — ABNORMAL HIGH (ref 70–99)
Glucose-Capillary: 180 mg/dL — ABNORMAL HIGH (ref 70–99)
Glucose-Capillary: 206 mg/dL — ABNORMAL HIGH (ref 70–99)

## 2019-08-17 MED ORDER — GERHARDT'S BUTT CREAM
TOPICAL_CREAM | CUTANEOUS | Status: DC | PRN
  Filled 2019-08-17 (×2): qty 1

## 2019-08-17 NOTE — Progress Notes (Signed)
Notified Lab that ABG being sent for analysis. 

## 2019-08-17 NOTE — Progress Notes (Signed)
NAME:  Steve [name], MRN:  [id number], DOB:  Sep 06, 1955, LOS: 59 ADMISSION DATE:  07/08/2019, CONSULTATION DATE:  3/29 REFERRING MD:  [name], CHIEF COMPLAINT:  dyspnea   Brief History   64 y/o male, with hx COVID infection 05/2019, admitted with severe acute respiratory failure with hypoxemia in the setting of receiving chemotherapy for Hodgkin's lymphoma. ICU course prolonged due to ARDS, malnutrition.    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 ARDS. Bilateral PTX s/p CTx2 4/19 Remains on vent 60%/PEEP 8, likely to need trach  4/20 trach placed 4/26 Worsening SQ air, CT with tension pneumomediastinum, chest tube suction increased 4/29 On neo 84mg's 4/30 Weaning neo 5/4 weaning phenylephrine profoundly weak 5/5 add solucortef. Dc sxn to 20 bilateral desaturated, suction increased, but not clear that pneumo was actually worse 5/6: Suction once again reduced.  Stress dose steroids started, phenylephrine discontinued 5/7 chest tubes changed to waterseal, able to titrate midodrine down Consults:  ID >> s/o 4/09 Oncology >> s/o 4/05  Procedures:  4/8 ETT > 4/20 4/20 Trach >> 4/9 bilateral chest tubes  Significant Diagnostic Tests:   CTA chest 3/29 > no PE, diffuse GGO upper lobes, patchy consolidation in bases, peripheral fibrotic changes noted on prior CT chest still present  ECHO 3/30 >LVEF 60 to 609% grade 1 diastolic dysfunction. Mildly dilated LA, normal RV. Trivial MR and TR,otherwise normal valves  CT Intraperitoneal air, likely extension of the pneumomediastinum/pneumothorax. An intra-abdominal etiology such as bowel perforation is favored less likely.Extensive  pneumomediastinum and partially visualized right pneumothorax. Diarrheal state. Correlation with clinical exam and stool cultures recommended. No bowel obstruction. Normal appendix. Sigmoid diverticulosis  CT head 4/22 > NAICP, sub cutaneous air in neck, bilateral mastoid effusions CT Chest w/o 4/26 >> tension pneumomediastinum, bilateral chest tubes in place, continued widespread pulmonary opacity with development of some bronchiectasis  Micro Data:  UC 3/29 >>negative  BCx2 3/29 >>negative  PJP 4/2 >>negative  Fungitel 3/29 >> positive Blastomyces Antigen 3/20 >> negative Respiratory Culture 4/4 >> staph epidermis >> S-vanco, clinda BAL 4/8 >>candida glabrata Fungal (BAL) 4/8 >> Candida glabrata PJP (BAL) 4/8 >>negative  Aspergillus antigen (BAL) 4/8 >> Negative Tracheal aspirate 4/29 >> negative  Antimicrobials:  Cefepime 3/29 >>3/31 Vanc 3/29 >>3/30 Azithromycin 3/29 >> 4/1 Fluconazole 3/29 >> 4/13 Meropenem 3/31 >> 4/6 Bactrim 3/31 >> 4/4  Clindamycin 4/5 >>4/20 Primaquine 4/5 >>4/20 Anidulafungin 4/12 >> 4/20  Bactrim (PCP prophylaxis)    Scheduled Meds: . atorvastatin  10 mg Per Tube Daily  . chlorhexidine gluconate (MEDLINE KIT)  15 mL Mouth Rinse BID  . Chlorhexidine Gluconate Cloth  6 each Topical Daily  . collagenase   Topical Daily  . enoxaparin (LOVENOX) injection  40 mg Subcutaneous Q24H  . feeding supplement (PRO-STAT SUGAR FREE 64)  30 mL Per Tube TID  . feeding supplement (VITAL AF 1.2 CAL)  1,000 mL Per Tube Q24H  . ferrous sulfate  300 mg Per Tube Q breakfast  . fludrocortisone  0.1 mg Per Tube Daily  . free water  200 mL Per Tube Q4H  . hydrocortisone sod succinate (SOLU-CORTEF) inj  50 mg Intravenous Q6H  . insulin aspart  3-9 Units Subcutaneous Q4H  . insulin glargine  6  Units Subcutaneous Daily  . mouth rinse  15 mL Mouth Rinse 10 times per day  . midodrine  5 mg Oral TID with meals  . multivitamin  15 mL Per Tube Daily  . nutrition  supplement (JUVEN)  1 packet Oral BID BM  . oxyCODONE  5 mg Per Tube Q8H  . pantoprazole sodium  40 mg Per Tube Q24H  . sertraline  50 mg Oral Daily  . sodium chloride flush  10-40 mL Intracatheter Q12H  . sulfamethoxazole-trimethoprim  20 mL Per Tube Daily   Continuous Infusions: . sodium chloride Stopped (08/02/19 1311)  . sodium chloride Stopped (08/05/19 1243)  . phenylephrine (NEO-SYNEPHRINE) Adult infusion Stopped (08/15/19 0523)   PRN Meds:.Place/Maintain arterial line **AND** sodium chloride, acetaminophen (TYLENOL) oral liquid 160 mg/5 mL, acetaminophen, docusate, hydrALAZINE, HYDROmorphone (DILAUDID) injection, ipratropium-albuterol, lip balm, loperamide HCl, ondansetron (ZOFRAN) IV, sodium chloride   Interim history/subjective:  High  RR  on ards protocol @ 6 cc/ kg   Objective   Blood pressure 114/64, pulse 77, temperature 98.2 F (36.8 C), temperature source Axillary, resp. rate (!) 27, height _0  (1.676 m), weight 70.5 kg, SpO2 100 %.    Vent Mode: PRVC FiO2 (%):  [40 %-55 %] 50 % Set Rate:  [26 bmp] 26 bmp Vt Set:  [380 mL] 380 mL PEEP:  [5 cmH20] 5 cmH20 Plateau Pressure:  [13 cmH20-30 cmH20] 28 cmH20   Intake/Output Summary (Last 24 hours) at 08/17/2019 0949 Last data filed at 08/17/2019 0600 Gross per 24 hour  Intake 1284 ml  Output 2460 ml  Net -1176 ml   Filed Weights   08/15/19 0347 08/16/19 0437 08/17/19 0500  Weight: 73.4 kg 70.2 kg 70.5 kg    Examination: Tmax 99.4 Pt  Uncomfortable with high RR on PRVC No jvd Neck supple Lungs with a few scattered exp > insp rhonchi bilaterally RRR no s3 or or sign murmur Abd soft/ benign  Extr warm with no edema or clubbing noted Neuro  F/c all 4    I personally reviewed images and agree with radiology impression as follows:  pCXR:  5/8 1. Stable support apparatus. 2. Stable diffuse fine airspace disease. 3. Bilateral chest tubes without evidence of pneumothorax.    Resolved Hospital Problem list     Candida glabrata in BAL PJP pneumonia, suspected, treated for 21 days Hypernatremia   Assessment & Plan:   Ventilator dependence w/ ARDS  ? Central apnea remains ventilator and trach dependent  -Profound deconditioning and severe malnutrition status post prolonged critical illness seem to be the primary barrier to ventilator liberation  plan Continue full ventilatory support but liberalize VT to 8 cc/ kg and reduce back up rate to 20 so he's setting his own rate hopefully closer to backup  Assess for readiness to wean daily  Continue IV Lasix as BP BUN/creatinine tolerate  Continuing prophylactic Bactrim  Continue to readdress goals of care    Spontaneous pneumothoraces Tension Pneumomediastinum Sub cutaneous emphysema, peritoneal free air No air leak via chest tubes  plan Continue  chest tubes to waterseal/ should be able to tolerate a bit higher VT now s new "volutrauma" issues and if not will need higher sedation to ventilate at  low  vt    Hypotension; seemingly responding to treatment of relative adrenal insufficiency Etiology likely multifactorial, medication related, relative adrenal insufficiency from prolonged critical illness.  Blood pressure remarkably improved after adding stress dose steroids remains off pressors Plan Decreased midodrine to 5 mg 3  times daily Continue Solu-Cortef  And Florinef May need maintenance dosing of Solu-Cortef   Acute metabolic encephalopathy , improving Need for sedation for mechanical ventilation Fentanyl tachyphylaxis Plan Low-dose narcotics via tube and add as needed Dilaudid  Anemia of chronic illness Mild Thrombocytopenia  Hodgkin lymphoma   Lab Results  Component Value Date   HGB 7.7 (L) 08/15/2019   HGB 7.8 (L) 08/14/2019   HGB 7.6 (L) 08/13/2019   HGB 10.3 (L) 07/15/2019   HGB 9.0 (L) 07/01/2019   HGB 9.6 (L) 06/17/2019   HGB 10.4 (L) 06/03/2019     Lab Results  Component Value Date   PLT 164 08/15/2019   PLT  179 08/14/2019   PLT 190 08/13/2019   PLT 340 07/01/2019   PLT 262 06/17/2019   PLT 272 06/03/2019    Plan Intermittent CBC  tx PRBC's for hgb <7   Hyperlipidemia Plan Statin continue   Severe protein calorie malnutrition Plan Continue tube feeds  Physical deconditioning Plan We will need prolonged physical therapy  Sacral Decubitus  Plan Continue wound care  Goals of care -appreciate prior discussion with palliative care, family wishes for full scope.  They have young children (60 & 52 y/o)   Best practice:  Diet: tube feeding Pain/Anxiety/Delirium protocol (if indicated): as above VAP protocol (if indicated): yes DVT prophylaxis: lovenox GI prophylaxis: PPI Glucose control: SSI Mobility: bed rest Code Status: full Family Communication: sister at bedside am 5/8 - updated Disposition: He will eventually go to [facility], however need to get chest tubes out prior to this.     The patient is critically ill with multiple organ systems failure and requires high complexity decision making for assessment and support, frequent evaluation and titration of therapies, application of advanced monitoring technologies and extensive interpretation of multiple databases. Critical Care Time devoted to patient care services described in this note is 38  minutes.    Christinia Gully, MD Pulmonary and [facility] [phone] After 6:00 PM or weekends, use Beeper [phone]  After 7:00 pm call Elink  [phone]

## 2019-08-17 NOTE — Progress Notes (Signed)
CRITICAL VALUE ALERT  Critical Value:  PCO2 68.5  Date & Time Notied:  08/17/2019 1130  Provider Notified: Melvyn Novas, MD  Orders Received/Actions taken: No new orders at this time

## 2019-08-18 ENCOUNTER — Inpatient Hospital Stay (HOSPITAL_COMMUNITY): Payer: BC Managed Care – PPO

## 2019-08-18 LAB — BASIC METABOLIC PANEL
Anion gap: 9 (ref 5–15)
BUN: 33 mg/dL — ABNORMAL HIGH (ref 8–23)
CO2: 42 mmol/L — ABNORMAL HIGH (ref 22–32)
Calcium: 8.7 mg/dL — ABNORMAL LOW (ref 8.9–10.3)
Chloride: 88 mmol/L — ABNORMAL LOW (ref 98–111)
Creatinine, Ser: 0.31 mg/dL — ABNORMAL LOW (ref 0.61–1.24)
GFR calc Af Amer: >60 mL/min (ref >60)
GFR calc non Af Amer: >60 mL/min (ref >60)
Glucose, Bld: 128 mg/dL — ABNORMAL HIGH (ref 70–99)
Potassium: 3.3 mmol/L — ABNORMAL LOW (ref 3.5–5.1)
Sodium: 139 mmol/L (ref 135–145)

## 2019-08-18 LAB — GLUCOSE, CAPILLARY
Glucose-Capillary: 114 mg/dL — ABNORMAL HIGH (ref 70–99)
Glucose-Capillary: 121 mg/dL — ABNORMAL HIGH (ref 70–99)
Glucose-Capillary: 160 mg/dL — ABNORMAL HIGH (ref 70–99)
Glucose-Capillary: 173 mg/dL — ABNORMAL HIGH (ref 70–99)
Glucose-Capillary: 181 mg/dL — ABNORMAL HIGH (ref 70–99)
Glucose-Capillary: 209 mg/dL — ABNORMAL HIGH (ref 70–99)

## 2019-08-18 LAB — COMPREHENSIVE METABOLIC PANEL
ALT: 21 U/L (ref 0–44)
AST: 22 U/L (ref 15–41)
Albumin: 2.3 g/dL — ABNORMAL LOW (ref 3.5–5.0)
Alkaline Phosphatase: 50 U/L (ref 38–126)
Anion gap: 8 (ref 5–15)
BUN: 36 mg/dL — ABNORMAL HIGH (ref 8–23)
CO2: 44 mmol/L — ABNORMAL HIGH (ref 22–32)
Calcium: 8.7 mg/dL — ABNORMAL LOW (ref 8.9–10.3)
Chloride: 87 mmol/L — ABNORMAL LOW (ref 98–111)
Creatinine, Ser: <0.3 mg/dL — ABNORMAL LOW (ref 0.61–1.24)
Glucose, Bld: 142 mg/dL — ABNORMAL HIGH (ref 70–99)
Potassium: 2.5 mmol/L — CL (ref 3.5–5.1)
Sodium: 139 mmol/L (ref 135–145)
Total Bilirubin: 0.6 mg/dL (ref 0.3–1.2)
Total Protein: 5.8 g/dL — ABNORMAL LOW (ref 6.5–8.1)

## 2019-08-18 LAB — CBC
HCT: 26.5 % — ABNORMAL LOW (ref 39.0–52.0)
Hemoglobin: 8 g/dL — ABNORMAL LOW (ref 13.0–17.0)
MCH: 30.8 pg (ref 26.0–34.0)
MCHC: 30.2 g/dL (ref 30.0–36.0)
MCV: 101.9 fL — ABNORMAL HIGH (ref 80.0–100.0)
Platelets: 179 10*3/uL (ref 150–400)
RBC: 2.6 MIL/uL — ABNORMAL LOW (ref 4.22–5.81)
RDW: 16.9 % — ABNORMAL HIGH (ref 11.5–15.5)
WBC: 5.1 10*3/uL (ref 4.0–10.5)
nRBC: 0 % (ref 0.0–0.2)

## 2019-08-18 LAB — MRSA PCR SCREENING: MRSA by PCR: NEGATIVE

## 2019-08-18 LAB — PHOSPHORUS: Phosphorus: 3.2 mg/dL (ref 2.5–4.6)

## 2019-08-18 LAB — MAGNESIUM: Magnesium: 1.8 mg/dL (ref 1.7–2.4)

## 2019-08-18 MED ORDER — POTASSIUM CHLORIDE 20 MEQ/15ML (10%) PO SOLN
40.0000 meq | Freq: Once | ORAL | Status: AC
  Administered 2019-08-18: 14:00:00 40 meq
  Filled 2019-08-18: qty 30

## 2019-08-18 MED ORDER — HYDROCORTISONE 20 MG PO TABS
50.0000 mg | ORAL_TABLET | Freq: Two times a day (BID) | ORAL | Status: DC
  Administered 2019-08-18 (×2): 50 mg
  Filled 2019-08-18 (×3): qty 1

## 2019-08-18 MED ORDER — POTASSIUM CHLORIDE 20 MEQ/15ML (10%) PO SOLN
40.0000 meq | Freq: Once | ORAL | Status: AC
  Administered 2019-08-18: 17:00:00 40 meq
  Filled 2019-08-18: qty 30

## 2019-08-18 MED ORDER — HYDROCORTISONE 2 MG/ML ORAL SUSPENSION
50.0000 mg | Freq: Two times a day (BID) | ORAL | Status: DC
  Filled 2019-08-18: qty 25

## 2019-08-18 NOTE — Progress Notes (Signed)
K+  3.3.  [facility]-Link notified. Awaiting orders.

## 2019-08-18 NOTE — Progress Notes (Addendum)
NAME:  Steve [name], MRN:  [id number], DOB:  Jul 04, 1955, LOS: 30 ADMISSION DATE:  07/08/2019, CONSULTATION DATE:  3/29 REFERRING MD:  [name]  CHIEF COMPLAINT:  Dyspnea   Brief History   64 y/o male, with hx COVID infection 05/2019, admitted with severe acute respiratory failure with hypoxemia in the setting of receiving chemotherapy for Hodgkin's lymphoma. ICU course prolonged due to ARDS, malnutrition.    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 ARDS. Bilateral PTX s/p CTx2 4/19 Remains on vent 60%/PEEP 8, likely to need trach  4/20 trach placed 4/26 Worsening SQ air, CT with tension pneumomediastinum, chest tube suction increased 4/29 On neo 71mg's 4/30 Weaning neo 5/4 weaning phenylephrine profoundly weak 5/5 add solucortef. Dc sxn to 20 bilateral desaturated, suction increased, but not clear that pneumo was actually worse 5/6: Suction once again reduced.  Stress dose steroids started, phenylephrine discontinued 5/7 chest tubes changed to waterseal, able to titrate midodrine down   Consults:  ID >> s/o 4/09 Oncology >> s/o 4/05  Procedures:  4/8 ETT > 4/20 4/20 Trach >> 4/9 bilateral chest tubes  Significant Diagnostic Tests:   CTA chest 3/29 > no PE, diffuse GGO upper lobes, patchy consolidation in bases, peripheral fibrotic changes noted on prior CT chest still present  ECHO 3/30 >LVEF 60 to 631% grade 1 diastolic dysfunction. Mildly dilated LA, normal RV. Trivial MR and TR,otherwise normal valves  CT Intraperitoneal air, likely extension of the pneumomediastinum/pneumothorax. An intra-abdominal etiology such as bowel perforation is favored less likely.Extensive  pneumomediastinum and partially visualized right pneumothorax. Diarrheal state. Correlation with clinical exam and stool cultures recommended. No bowel obstruction. Normal appendix. Sigmoid diverticulosis  CT head 4/22 > NAICP, sub cutaneous air in neck, bilateral mastoid effusions CT Chest w/o 4/26 >> tension pneumomediastinum, bilateral chest tubes in place, continued widespread pulmonary opacity with development of some bronchiectasis  Micro Data:  UC 3/29 >>negative  BCx2 3/29 >>negative  PJP 4/2 >>negative  Fungitel 3/29 >> positive Blastomyces Antigen 3/20 >> negative Respiratory Culture 4/4 >> staph epidermis >> S-vanco, clinda BAL 4/8 >>candida glabrata Fungal (BAL) 4/8 >> Candida glabrata PJP (BAL) 4/8 >>negative  Aspergillus antigen (BAL) 4/8 >> Negative Tracheal aspirate 4/29 >> negative  Antimicrobials:  Cefepime 3/29 >>3/31 Vanc 3/29 >>3/30 Azithromycin 3/29 >> 4/1 Fluconazole 3/29 >> 4/13 Meropenem 3/31 >> 4/6 Bactrim 3/31 >> 4/4  Clindamycin 4/5 >>4/20 Primaquine 4/5 >>4/20 Anidulafungin 4/12 >> 4/20  Bactrim (PCP prophylaxis)    Scheduled Meds: . atorvastatin  10 mg Per Tube Daily  . chlorhexidine gluconate (MEDLINE KIT)  15 mL Mouth Rinse BID  . Chlorhexidine Gluconate Cloth  6 each Topical Daily  . collagenase   Topical Daily  . enoxaparin (LOVENOX) injection  40 mg Subcutaneous Q24H  . feeding supplement (PRO-STAT SUGAR FREE 64)  30 mL Per Tube TID  . feeding supplement (VITAL AF 1.2 CAL)  1,000 mL Per Tube Q24H  . ferrous sulfate  300 mg Per Tube Q breakfast  . fludrocortisone  0.1 mg Per Tube Daily  . free water  200 mL Per Tube Q4H  . hydrocortisone  50 mg Per Tube BID  . insulin aspart  3-9 Units Subcutaneous Q4H  . insulin glargine  6  Units Subcutaneous Daily  . mouth rinse  15 mL Mouth Rinse 10 times per day  . midodrine  5 mg Oral TID with meals  . multivitamin  15 mL Per Tube Daily  . nutrition supplement (JUVEN)  1 packet Oral  BID BM  . oxyCODONE  5 mg Per Tube Q8H  . pantoprazole sodium  40 mg Per Tube Q24H  . sertraline  50 mg Oral Daily  . sodium chloride flush  10-40 mL Intracatheter Q12H  . sulfamethoxazole-trimethoprim  20 mL Per Tube Daily   Continuous Infusions: . sodium chloride Stopped (08/02/19 1311)  . sodium chloride Stopped (08/05/19 1243)   PRN Meds:.Place/Maintain arterial line **AND** sodium chloride, acetaminophen (TYLENOL) oral liquid 160 mg/5 mL, acetaminophen, docusate, Gerhardt's butt cream, hydrALAZINE, HYDROmorphone (DILAUDID) injection, ipratropium-albuterol, lip balm, loperamide HCl, ondansetron (ZOFRAN) IV, sodium chloride   Interim history/subjective:  Much more comfortable appearing with vt up to 8 cc/kg / much more alert today  Objective   Blood pressure 116/63, pulse 68, temperature 97.8 F (36.6 C), temperature source Axillary, resp. rate (!) 27, height _0  (1.676 m), weight 69.3 kg, SpO2 99 %.    Vent Mode: PRVC FiO2 (%):  [40 %-50 %] 45 % Set Rate:  [20 bmp] 20 bmp Vt Set:  [510 mL] 510 mL PEEP:  [5 cmH20] 5 cmH20 Plateau Pressure:  [28 cmH20-36 cmH20] 32 cmH20   Intake/Output Summary (Last 24 hours) at 08/18/2019 3734 Last data filed at 08/18/2019 0700 Gross per 24 hour  Intake 2125 ml  Output 720 ml  Net 1405 ml   Filed Weights   08/16/19 0437 08/17/19 0500 08/18/19 0500  Weight: 70.2 kg 70.5 kg 69.3 kg     Examination: Tmax  98.4 Alert mouths hospital, mother's day No jvd/ trach site looks good Neck supple Lungs with very coarse BS bilaterally/ no air leaks RRR no s3 or or sign murmur Abd soft/ tol tf, nl excursion  Extr  prevalon boots Neuro  follows commands all 4 ext/ much more alert    Resolved Hospital Problem list   Candida glabrata in BAL PJP pneumonia, suspected, treated for 21 days Hypernatremia   Assessment & Plan:   Ventilator dependence w/ ARDS   remains ventilator and trach dependent  -Profound deconditioning and severe  malnutrition status post prolonged critical illness seem to be the primary barrier to ventilator liberation with very poor mechanics/ high wob c/w FP ARDS plan Continue full ventilatory support  @  VT to 8 cc/ kg    Assess for readiness to wean daily  Continuing prophylactic Bactrim  Continue to readdress goals of care  Note he has a significant metabolic alkalosis likely post hypercapneic ? partly from florinef/hc rx but this should help reduce his resp overdrive so will not rx diamox for now / keep K up will help   Spontaneous pneumothoraces Tension Pneumomediastinum Sub cutaneous emphysema, peritoneal free air No air leak via chest tubes  plan Continue  chest tubes to waterseal/ should be able to tolerate a bit higher VT now s new "volutrauma" issues and if not will need higher sedation to ventilate at  low  vt    Hypotension; seemingly responding to treatment of relative adrenal insufficiency Etiology likely multifactorial, medication related, relative adrenal insufficiency from prolonged critical illness.  Blood pressure remarkably improved after adding stress dose steroids remains off pressors Plan Continue  midodrine to 5 mg 3 times daily Continue  And Florinef and changed hydrocortisone to tube route 5/9  May need maintenance dosing of Solu-Cortef   Acute metabolic encephalopathy , improving Need for sedation for mechanical ventilation Fentanyl tachyphylaxis Plan Low-dose narcotics via tube and add as needed Dilaudid  Anemia of chronic illness Mild Thrombocytopenia  Hodgkin lymphoma   Lab Results  Component Value Date   HGB 7.7 (L) 08/15/2019   HGB 7.8 (L) 08/14/2019   HGB 7.6 (L) 08/13/2019   HGB 10.3 (L) 07/15/2019   HGB 9.0 (L) 07/01/2019   HGB 9.6 (L) 06/17/2019   HGB 10.4 (L) 06/03/2019     Lab Results  Component Value Date   PLT 164 08/15/2019   PLT 179 08/14/2019   PLT 190 08/13/2019   PLT 340 07/01/2019   PLT 262 06/17/2019   PLT 272 06/03/2019      Plan Recheck labs am 5/10 tx PRBC's for hgb <7   Hyperlipidemia Plan Statin continue    Severe protein calorie malnutrition Plan Continue tube feeds   Physical deconditioning Plan will need prolonged physical therapy/ likely LTAC   Sacral Decubitus  Plan Continue wound care  Goals of care -appreciate prior discussion with palliative care, family wishes for full scope.  They have young children (23 & 27 y/o)   Best practice:  Diet: tube feeding Pain/Anxiety/Delirium protocol (if indicated):   VAP protocol (if indicated): yes DVT prophylaxis: lovenox GI prophylaxis: PPI Glucose control: SSI Mobility: bed rest Code Status: full Family Communication: sister at bedside am 5/8 - updated Disposition: He will eventually go to [facility], however need to get chest tubes out prior to this.   The patient is critically ill with multiple organ systems failure and requires high complexity decision making for assessment and support, frequent evaluation and titration of therapies, application of advanced monitoring technologies and extensive interpretation of multiple databases. Critical Care Time devoted to patient care services described in this note is 35 minutes.    Christinia Gully, MD Pulmonary and [place] [phone] After 6:00 PM or weekends, use Beeper [phone]  After 7:00 pm call Elink  [phone]

## 2019-08-18 NOTE — Progress Notes (Signed)
Upon entering room, RN found patients panda tube was pulled out by patient. Patient states he does not want another tube put down his nose. Patient also had two hands around his trach. RN explained to patient that his trach was helping him to breathe. RN placed mittens on patients to help to remind him not to pull at his trach. [facility]-Link RN notified. No new orders. RN will continue to monitor closely.

## 2019-08-18 NOTE — Progress Notes (Signed)
K+ 2.5 called to Dr Melvyn Novas orders received

## 2019-08-19 ENCOUNTER — Inpatient Hospital Stay (HOSPITAL_COMMUNITY): Payer: BC Managed Care – PPO

## 2019-08-19 DIAGNOSIS — R1310 Dysphagia, unspecified: Secondary | ICD-10-CM

## 2019-08-19 DIAGNOSIS — J939 Pneumothorax, unspecified: Secondary | ICD-10-CM

## 2019-08-19 LAB — GLUCOSE, CAPILLARY
Glucose-Capillary: 131 mg/dL — ABNORMAL HIGH (ref 70–99)
Glucose-Capillary: 128 mg/dL — ABNORMAL HIGH (ref 70–99)
Glucose-Capillary: 88 mg/dL (ref 70–99)
Glucose-Capillary: 104 mg/dL — ABNORMAL HIGH (ref 70–99)
Glucose-Capillary: 112 mg/dL — ABNORMAL HIGH (ref 70–99)
Glucose-Capillary: 107 mg/dL — ABNORMAL HIGH (ref 70–99)

## 2019-08-19 LAB — BASIC METABOLIC PANEL
Anion gap: 10 (ref 5–15)
BUN: 28 mg/dL — ABNORMAL HIGH (ref 8–23)
CO2: 38 mmol/L — ABNORMAL HIGH (ref 22–32)
Calcium: 8.9 mg/dL (ref 8.9–10.3)
Chloride: 93 mmol/L — ABNORMAL LOW (ref 98–111)
Creatinine, Ser: <0.3 mg/dL — ABNORMAL LOW (ref 0.61–1.24)
Glucose, Bld: 110 mg/dL — ABNORMAL HIGH (ref 70–99)
Potassium: 3.9 mmol/L (ref 3.5–5.1)
Sodium: 141 mmol/L (ref 135–145)

## 2019-08-19 MED ORDER — FENTANYL CITRATE (PF) 100 MCG/2ML IJ SOLN
INTRAMUSCULAR | Status: AC
  Administered 2019-08-19: 10:00:00 25 ug via INTRAVENOUS
  Filled 2019-08-19: qty 2

## 2019-08-19 MED ORDER — MAGNESIUM SULFATE 2 GM/50ML IV SOLN
2.0000 g | Freq: Once | INTRAVENOUS | Status: AC
  Administered 2019-08-19: 14:00:00 2 g via INTRAVENOUS
  Filled 2019-08-19: qty 50

## 2019-08-19 MED ORDER — LORAZEPAM 2 MG/ML IJ SOLN
1.0000 mg | INTRAMUSCULAR | Status: DC | PRN
  Administered 2019-08-19 – 2019-08-20 (×2): 1 mg via INTRAVENOUS
  Administered 2019-08-24: 2 mg via INTRAVENOUS
  Filled 2019-08-19 (×3): qty 1

## 2019-08-19 MED ORDER — FENTANYL CITRATE (PF) 100 MCG/2ML IJ SOLN: 25.0000 ug | INTRAMUSCULAR | Status: DC | PRN

## 2019-08-19 MED ORDER — HYDROMORPHONE HCL 1 MG/ML IJ SOLN
0.5000 mg | Freq: Once | INTRAMUSCULAR | Status: DC
  Filled 2019-08-19: qty 1

## 2019-08-19 MED ORDER — LORAZEPAM 2 MG/ML IJ SOLN
INTRAMUSCULAR | Status: AC
  Administered 2019-08-19: 12:00:00 1 mg via INTRAVENOUS
  Filled 2019-08-19: qty 1

## 2019-08-19 MED ORDER — HYDROMORPHONE HCL 1 MG/ML IJ SOLN
0.5000 mg | INTRAMUSCULAR | Status: DC | PRN
  Administered 2019-08-19 – 2019-08-24 (×9): 0.5 mg via INTRAVENOUS
  Filled 2019-08-19 (×9): qty 1

## 2019-08-19 MED ORDER — POTASSIUM CHLORIDE 10 MEQ/50ML IV SOLN
10.0000 meq | INTRAVENOUS | Status: AC
  Administered 2019-08-19 (×6): 10 meq via INTRAVENOUS
  Filled 2019-08-19 (×6): qty 50

## 2019-08-19 MED ORDER — HYDROMORPHONE HCL 2 MG/ML IJ SOLN
INTRAMUSCULAR | Status: AC
  Filled 2019-08-19: qty 1

## 2019-08-19 MED ORDER — HYDROCORTISONE NA SUCCINATE PF 100 MG IJ SOLR
50.0000 mg | Freq: Two times a day (BID) | INTRAMUSCULAR | Status: DC
  Administered 2019-08-19 – 2019-08-22 (×8): 50 mg via INTRAVENOUS
  Filled 2019-08-19 (×8): qty 2

## 2019-08-19 NOTE — Progress Notes (Signed)
Physical Therapy Treatment Patient Details Name: Steve [name] MRN: [id number] DOB: 12-02-1955 Today's Date: 08/19/2019    History of Present Illness 64 y/o gentleman with Hodgkin's lymphoma, previous Covid who has severe bilateral pneumonia vs pneumonitis with vent-dependent respiratory failure and pneuthoraces with pneumomediastinum.    PT Comments    The patient is awake and able to follow some directions and participated in Exercises with Bilateral U/Le's. Patient drifts at times and requires stimulation to return to task./ Continue PT and consider sitting on bed edge as able with bilateral CT's.   Follow Up Recommendations  LTACH     Equipment Recommendations  None recommended by PT    Recommendations for Other Services       Precautions / Restrictions Precautions Precaution Comments: vent, trach, bilateral chest tubes, , peg tube, mits    Mobility  Bed Mobility               General bed mobility comments: repostioned in bed,  Transfers                    Ambulation/Gait                 Stairs             Wheelchair Mobility    Modified Rankin (Stroke Patients Only)       Balance                                            Cognition Arousal/Alertness: Awake/alert   Overall Cognitive Status: Impaired/Different from baseline Area of Impairment: Orientation                               General Comments: patient able to mouth that he was in hospital. followed simple commands inconsistently, at times  stares off and requires stimulation to arouse,  patient mouthed that he had 4 children( not every words that he mouthed were readible)      Exercises General Exercises - Upper Extremity Shoulder Flexion: AAROM;10 reps;Both Shoulder Extension: AAROM;10 reps;Both Shoulder ABduction: AAROM;10 reps;Both Shoulder ADduction: AAROM;10 reps;Both General Exercises - Lower Extremity Ankle Circles/Pumps:  AAROM;10 reps;Both Short Arc Quad: AAROM;10 reps;Both Heel Slides: AAROM;10 reps;Both Hip ABduction/ADduction: AROM;10 reps;Both    General Comments        Pertinent Vitals/Pain Pain Assessment: No/denies pain    Home Living                      Prior Function            PT Goals (current goals can now be found in the care plan section) Progress towards PT goals: Progressing toward goals    Frequency    Min 3X/week      PT Plan Current plan remains appropriate;Frequency needs to be updated    Co-evaluation              AM-PAC PT "6 Clicks" Mobility   Outcome Measure  Help needed turning from your back to your side while in a flat bed without using bedrails?: Total Help needed moving from lying on your back to sitting on the side of a flat bed without using bedrails?: Total Help needed moving to and from a bed to a chair (including a wheelchair)?: Total Help needed  standing up from a chair using your arms (e.g., wheelchair or bedside chair)?: Total Help needed to walk in hospital room?: Total Help needed climbing 3-5 steps with a railing? : Total 6 Click Score: 6    End of Session   Activity Tolerance: Patient tolerated treatment well Patient left: in bed Nurse Communication: Need for lift equipment;Mobility status PT Visit Diagnosis: Muscle weakness (generalized) (M62.81);Other symptoms and signs involving the nervous system (Y05.110)     Time: 2111-7356 PT Time Calculation (min) (ACUTE ONLY): 23 min  Charges:  $Therapeutic Exercise: 23-37 mins                     Steve [name] PT Acute Rehabilitation Services Pager [phone] Office [phone]    Steve [name] 08/19/2019, 2:06 PM

## 2019-08-19 NOTE — TOC Progression Note (Addendum)
Transition of Care [facility]) - Progression Note    Patient Details  Name: Steve [name] MRN: [id number] Date of Birth: [birth year]  [facility]) CM/SW Contact  Leeroy Cha, RN Phone Number: 08/19/2019, 8:50 AM  Clinical Narrative:    1506/tct-wife/discussed ltch options would lik to chose [facility] with [facility] notified.  Remains vent dependant , trached on 042021/[facility]? Bilateral chest tubes to water seal 050921/plan is for ltach once chest tubes are out.   Expected Discharge Plan: Long Term Acute Care (LTAC) Barriers to Discharge: Continued Medical Work up  Expected Discharge Plan and Services Expected Discharge Plan: [place] (LTAC)       Living arrangements for the past 2 months: Single Family Home                                       Social Determinants of Health (SDOH) Interventions    Readmission Risk Interventions Readmission Risk Prevention Plan 07/22/2019  Transportation Screening Complete  Medication Review [employer] photographer) Complete  Some recent data might be hidden

## 2019-08-19 NOTE — Progress Notes (Signed)
NAME:  RAE SUTCLIFFE, MRN:  [id number], DOB:  Nov 17, 1955, LOS: 21 ADMISSION DATE:  07/08/2019, CONSULTATION DATE:  3/29 REFERRING MD:  [name]  CHIEF COMPLAINT:  Dyspnea   Brief History   64 y/o male, with hx COVID infection 05/2019, admitted with severe acute respiratory failure with hypoxemia in the setting of receiving chemotherapy for Hodgkin's lymphoma. ICU course prolonged due to ARDS, malnutrition.    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 ARDS. Bilateral PTX s/p CTx2 4/19 Remains on vent 60%/PEEP 8, likely to need trach  4/20 trach placed 4/26 Worsening SQ air, CT with tension pneumomediastinum, chest tube suction increased 4/29 On neo 29mg's 4/30 Weaning neo 5/4 weaning phenylephrine profoundly weak 5/5 add solucortef. Dc sxn to 20 bilateral desaturated, suction increased, but not clear that pneumo was actually worse 5/6: Suction once again reduced.  Stress dose steroids started, phenylephrine discontinued 5/7 chest tubes changed to waterseal, able to titrate midodrine down  Consults:  ID >> s/o 4/09 Oncology >> s/o 4/05  Procedures:  4/8 ETT > 4/20 4/20 Trach >> 4/9 bilateral chest tubes  Significant Diagnostic Tests:   CTA chest 3/29 > no PE, diffuse GGO upper lobes, patchy consolidation in bases, peripheral fibrotic changes noted on prior CT chest still present  ECHO 3/30 >LVEF 60 to 689% grade 1 diastolic dysfunction. Mildly dilated LA, normal RV. Trivial MR and TR,otherwise normal valves  CT Intraperitoneal air, likely extension of the pneumomediastinum/pneumothorax. An intra-abdominal etiology such as bowel perforation is favored less likely.Extensive  pneumomediastinum and partially visualized right pneumothorax. Diarrheal state. Correlation with clinical exam and stool cultures recommended. No bowel obstruction. Normal appendix. Sigmoid diverticulosis  CT head 4/22 > NAICP, sub cutaneous air in neck, bilateral mastoid effusions CT Chest w/o 4/26 >> tension pneumomediastinum, bilateral chest tubes in place, continued widespread pulmonary opacity with development of some bronchiectasis  Micro Data:  UC 3/29 >>negative  BCx2 3/29 >>negative  PJP 4/2 >>negative  Fungitel 3/29 >> positive Blastomyces Antigen 3/20 >> negative Respiratory Culture 4/4 >> staph epidermis >> S-vanco, clinda BAL 4/8 >>candida glabrata Fungal (BAL) 4/8 >> Candida glabrata PJP (BAL) 4/8 >>negative  Aspergillus antigen (BAL) 4/8 >> Negative Tracheal aspirate 4/29 >> negative  Antimicrobials:  Cefepime 3/29 >>3/31 Vanc 3/29 >>3/30 Azithromycin 3/29 >> 4/1 Fluconazole 3/29 >> 4/13 Meropenem 3/31 >> 4/6 Bactrim 3/31 >> 4/4 Clindamycin 4/5 >>4/20 Primaquine 4/5 >>4/20 Anidulafungin 4/12 >> 4/20 Bactrim (PCP prophylaxis)   Interim history/subjective:  Slightly worked up after working with PT.  SpO2 in low 90s on 50%.  Has intermittent tachypnea into high 20's and low 30's.  But he nods his head "no" when asked if he is in any pain or distress. Pulled out NG tube overnight.  Objective   Blood pressure 122/77, pulse 67, temperature 97.7 F (36.5 C), temperature source Axillary, resp. rate (!) 25, height _0  (1.676 m), weight 68.4 kg, SpO2 94 %.    Vent Mode: PRVC FiO2 (%):  [45 %-70 %] 50 % Set Rate:  [20 bmp] 20 bmp Vt Set:  [510 mL] 510 mL PEEP:  [5 cmH20] 5 cmH20 Plateau Pressure:  [20 cmH20-30 cmH20] 20 cmH20   Intake/Output Summary (Last 24 hours) at 08/19/2019 0931 Last data  filed at 08/19/2019 0542 Gross per 24 hour  Intake 1291.07 ml  Output 825 ml  Net 466.07 ml   Filed Weights   08/17/19 0500 08/18/19 0500 08/19/19 0500    Weight: 70.5 kg 69.3 kg 68.4 kg     Examination: General: Adult male, chronically ill appearing, resting in bed, in NAD. Neuro: A&O x 3, mouths words.  MAE's. HEENT: [place]/AT. Sclerae anicteric. Trach C/D/I. Cardiovascular: RRR, no M/R/G.  Lungs: Respirations even and unlabored.  CTA bilaterally, No W/R/R. Abdomen: BS x 4, soft, NT/ND.  Musculoskeletal: No gross deformities, no edema.  Skin: Intact, warm, no rashes.   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  Profound deconditioning and severe malnutrition status post prolonged critical illness seem to be the primary barrier to ventilator liberation with very poor mechanics/ high wob c/w FP ARDS. - Continue full ventilatory support  @  VT to 8 cc/ kg.    - Assess for readiness to wean daily.  - Will plan for trach change to XLT once FiO2 requirements improve (hopefully AM 5/11). - Hopefully SLP eval once trach has been changed and if we can keep him off vent. - Continuing prophylactic Bactrim.  - Continue to readdress goals of care.   Spontaneous pneumothoraces - improved s/p chest tubes.  On water seal since 5/7, no air leaks and CXR stable. Tension Pneumomediastinum Sub cutaneous emphysema, peritoneal free air - Leave chest tubes in for today and await trach change AM 5/11 before getting CT chest and considering pulling tubes - need to ensure will tolerate change without issue and also is able to transport safely etc).  Hypotension; seemingly responding to treatment of relative adrenal insufficiency.Etiology likely multifactorial, medication related, relative adrenal insufficiency from prolonged critical illness.  Blood pressure remarkably improved after adding stress dose steroids remains off pressors - Continue midodrine to 5 mg 3 times daily (on hold for now as  no enteral access and NPO). - Change hydrocortisone back to IV until can establish enteral access. - May need maintenance dosing of Solu-Cortef - Resume florinef once enteral access established.  Acute metabolic encephalopathy , resolved. Fentanyl tachyphylaxis. - Continue PRN dilaudid. - Resume scheduled oxycodone once enteral access established.  Anemia of chronic illness Mild Thrombocytopenia. Hodgkin lymphoma.  - Transfuse per usual guidelines.  Hyperlipidemia. - Continue atorvastatin.  Severe protein calorie malnutrition. - Continue tube feeds once enteral access established.  Physical deconditioning. - Continue PT. - Will likely need LTAC.  Sacral Decubitus ulcers. - Continue wound care.  Hypokalemia - s/p repletion. - Follow up BMP.  Goals of care. - Appreciate prior discussion with palliative care, family wishes for full scope.  They have young children (83 & 43 y/o).  Dysphagia - had difficult NG placement at bedside and required sedation with visualization using glidescope. - IR order placed, hopefully can get this done today so that oral meds and tube feeds can be resumed.   Best practice:  Diet: tube feeding (holding for now as lost enteral access - difficult placement at bedside so IR order placed) Pain/Anxiety/Delirium protocol (if indicated):   VAP protocol (if indicated): yes DVT prophylaxis: lovenox GI prophylaxis: PPI Glucose control: SSI Mobility: bed rest Code Status: full Family Communication: Will attempt to call. Disposition: ICU.  He will eventually need LTAC.  CC time: 40 min.   Montey Hora,  PA - [facility] 08/19/2019, 10:08 AM

## 2019-08-19 NOTE — Progress Notes (Signed)
Patient pulled out small bore feeding tube overnight, planned to have tube replaced in IR on 5/10 at 1300 but patient not stable to travel. RR 30s, O2 sats low 80s, FIO2 increased to 60%. Will hold all PO meds and tube feeding, plan to place panda on 5/11. PRN medications given to decrease respiratory demands.

## 2019-08-20 ENCOUNTER — Inpatient Hospital Stay (HOSPITAL_COMMUNITY): Payer: BC Managed Care – PPO

## 2019-08-20 LAB — GLUCOSE, CAPILLARY
Glucose-Capillary: 122 mg/dL — ABNORMAL HIGH (ref 70–99)
Glucose-Capillary: 116 mg/dL — ABNORMAL HIGH (ref 70–99)
Glucose-Capillary: 93 mg/dL (ref 70–99)
Glucose-Capillary: 132 mg/dL — ABNORMAL HIGH (ref 70–99)
Glucose-Capillary: 179 mg/dL — ABNORMAL HIGH (ref 70–99)

## 2019-08-20 LAB — BASIC METABOLIC PANEL
Anion gap: 9 (ref 5–15)
BUN: 22 mg/dL (ref 8–23)
CO2: 36 mmol/L — ABNORMAL HIGH (ref 22–32)
Calcium: 8.3 mg/dL — ABNORMAL LOW (ref 8.9–10.3)
Chloride: 94 mmol/L — ABNORMAL LOW (ref 98–111)
Creatinine, Ser: 0.31 mg/dL — ABNORMAL LOW (ref 0.61–1.24)
GFR calc Af Amer: >60 mL/min (ref >60)
GFR calc non Af Amer: >60 mL/min (ref >60)
Glucose, Bld: 127 mg/dL — ABNORMAL HIGH (ref 70–99)
Potassium: 3.4 mmol/L — ABNORMAL LOW (ref 3.5–5.1)
Sodium: 139 mmol/L (ref 135–145)

## 2019-08-20 LAB — CBC
HCT: 28 % — ABNORMAL LOW (ref 39.0–52.0)
Hemoglobin: 8.5 g/dL — ABNORMAL LOW (ref 13.0–17.0)
MCH: 30.5 pg (ref 26.0–34.0)
MCHC: 30.4 g/dL (ref 30.0–36.0)
MCV: 100.4 fL — ABNORMAL HIGH (ref 80.0–100.0)
Platelets: 174 10*3/uL (ref 150–400)
RBC: 2.79 MIL/uL — ABNORMAL LOW (ref 4.22–5.81)
RDW: 17.4 % — ABNORMAL HIGH (ref 11.5–15.5)
WBC: 5.9 10*3/uL (ref 4.0–10.5)
nRBC: 0 % (ref 0.0–0.2)

## 2019-08-20 LAB — MAGNESIUM: Magnesium: 2.2 mg/dL (ref 1.7–2.4)

## 2019-08-20 LAB — PHOSPHORUS: Phosphorus: 4.3 mg/dL (ref 2.5–4.6)

## 2019-08-20 MED ORDER — LIDOCAINE HCL URETHRAL/MUCOSAL 2 % EX GEL
CUTANEOUS | Status: AC
  Filled 2019-08-20: qty 30

## 2019-08-20 MED ORDER — ETOMIDATE 2 MG/ML IV SOLN: 20.0000 mg | Freq: Once | INTRAVENOUS | Status: AC

## 2019-08-20 MED ORDER — ETOMIDATE 2 MG/ML IV SOLN
INTRAVENOUS | Status: AC
  Administered 2019-08-20: 20 mg via INTRAVENOUS
  Filled 2019-08-20: qty 10

## 2019-08-20 NOTE — Procedures (Addendum)
  Encounter for nasogastric tube placement  Indications: Enteral feeding.   He had previous nasogastric tube that he pulled out 2 days ago.  Today he went to interventional radiology but they could not get the tube.  He needs tube feeds and he also needs medications  Consent: Verbal informed consent from the wife.  Explained to the wife that and she was aware that patient had pulled out the NG tube.  Explained that radiology could not get the tube.  Explained that the ultrasound method of doing it with etomidate and glide scope will be used.  She accepted the risks and benefits versus limitations and agreed to proceed risks of etomidate and all procedural complications explained  Procedure: Ventilator was turned to 100% FiO2.  After placing the patient supine and ensuring adequate vital signs.  Etomidate 20 mg IV x1 was given.  After obtaining the adequate level of sedation/anesthesia bedside nurse passed the additive to the left naris.  Glide scope visualization was given.  As soon as it entered the oral cavity I took over insertion of the nasogastric tube.  Using visual guidance went posterior to the arytenoids and the vocal cord area.  The nasogastric tube was then further inserted.  Without resistance it went through.  Secured at 60 cm  Postprocedure: Abdominal x-ray ordered     SIGNATURE    Dr. Brand Males, M.D., F.C.C.P,  Pulmonary and Critical Care Medicine Staff Physician, [place] Director - Interstitial Lung Disease  Program  Pulmonary [facility] [place], [place], [address]  Pager: [phone], If no answer or between  15:00h - 7:00h: call [phone] Telephone: [phone]  2:38 PM 08/20/2019

## 2019-08-20 NOTE — TOC Progression Note (Signed)
Transition of Care [facility]) - Progression Note    Patient Details  Name: Steve [name] MRN: [id number] Date of Birth: 22-Oct-1955  Transition of Care [facility]) CM/SW Contact  Leeroy Cha, RN Phone Number: 08/20/2019, 9:49 AM  Clinical Narrative:    [id number] vent dep. At 60% fi02, no changes in condition, plans is for ltach-bcbs precert started awaiting decision, family has selected [facility] for LT[facility] awaiting bed.   Expected Discharge Plan: Long Term Acute Care (LTAC) Barriers to Discharge: Continued Medical Work up  Expected Discharge Plan and Services Expected Discharge Plan: [place] (LTAC)       Living arrangements for the past 2 months: Single Family Home                                       Social Determinants of Health (SDOH) Interventions    Readmission Risk Interventions Readmission Risk Prevention Plan 07/22/2019  Transportation Screening Complete  Medication Review [employer] photographer) Complete  Some recent data might be hidden

## 2019-08-20 NOTE — Progress Notes (Signed)
Feeding tube is in place with the tip in the mid to stomach. Okay to use NG tube/panda.   See radiology results   Electronically Signed   By: Rolm Baptise M.D.   On: 08/20/2019 15:57

## 2019-08-20 NOTE — Progress Notes (Signed)
NAME:  Steve [name], MRN:  [id number], DOB:  Oct 26, 1955, LOS: 63 ADMISSION DATE:  07/08/2019, CONSULTATION DATE:  3/29 REFERRING MD:  [name]  CHIEF COMPLAINT:  Dyspnea   Brief History   64 y/o male, with hx COVID infection 05/2019, admitted with severe acute respiratory failure with hypoxemia in the setting of receiving chemotherapy for Hodgkin's lymphoma. ICU course prolonged due to ARDS, malnutrition.    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 ARDS. Bilateral PTX s/p CTx2 4/19 Remains on vent 60%/PEEP 8, likely to need trach  4/20 trach placed 4/26 Worsening SQ air, CT with tension pneumomediastinum, chest tube suction increased 4/29 On neo 41mg's 4/30 Weaning neo 5/4 weaning phenylephrine profoundly weak 5/5 add solucortef. Dc sxn to 20 bilateral desaturated, suction increased, but not clear that pneumo was actually worse 5/6: Suction once again reduced.  Stress dose steroids started, phenylephrine discontinued 5/7 chest tubes changed to waterseal, able to titrate midodrine down  5/.10 -Slightly worked up after working with PT.  SpO2 in low 90s on 50%.  Has intermittent tachypnea into high 20's and low 30's.  But he nods his head "no" when asked if he is in any pain or distress. Pulled out NG tube overnight.  Consults:  ID >> s/o 4/09 Oncology >> s/o 4/05  Procedures:  4/8 ETT > 4/20 4/20 Trach >> 4/9 bilateral chest tubes  Significant Diagnostic Tests:   CTA chest 3/29 > no PE, diffuse GGO upper lobes, patchy consolidation in bases, peripheral fibrotic changes noted on prior CT chest still present  ECHO 3/30 >LVEF 60 to 658% grade 1 diastolic dysfunction. Mildly dilated LA,  normal RV. Trivial MR and TR,otherwise normal valves  CT Intraperitoneal air, likely extension of the pneumomediastinum/pneumothorax. An intra-abdominal etiology such as bowel perforation is favored less likely.Extensive pneumomediastinum and partially visualized right pneumothorax. Diarrheal state. Correlation with clinical exam and stool cultures recommended. No bowel obstruction. Normal appendix. Sigmoid diverticulosis  CT head 4/22 > NAICP, sub cutaneous air in neck, bilateral mastoid effusions CT Chest w/o 4/26 >> tension pneumomediastinum, bilateral chest tubes in place, continued widespread pulmonary opacity with development of some bronchiectasis  Micro Data:  UC 3/29 >>negative  BCx2 3/29 >>negative  PJP 4/2 >>negative  Fungitel 3/29 >> positive Blastomyces Antigen 3/20 >> negative Respiratory Culture 4/4 >> staph epidermis >> S-vanco, clinda BAL 4/8 >>candida glabrata Fungal (BAL) 4/8 >> Candida glabrata PJP (BAL) 4/8 >>negative  Aspergillus antigen (BAL) 4/8 >> Negative Tracheal aspirate 4/29 >> negative  Antimicrobials:  Cefepime 3/29 >>3/31 Vanc 3/29 >>3/30 Azithromycin 3/29 >> 4/1 Fluconazole 3/29 >> 4/13 Meropenem 3/31 >> 4/6 Bactrim 3/31 >> 4/4 Clindamycin 4/5 >>4/20 Primaquine 4/5 >>4/20 Anidulafungin 4/12 >> 4/20 Bactrim (PCP prophylaxis)   Interim history/subjective:   08/20/2019 - improved down to 40%. IR panda pending. Sister at bedside- interested in [place] Says patient has little kids and is fighter and wants full code. Per sister - he is more awake and interactive and significantly better than a week ago    Objective   Blood pressure 98/64, pulse 72, temperature 98 F (36.7 C), temperature source Oral, resp. rate (!) 30, height _0  (1.676 m), weight 67.2 kg, SpO2 94 %.    Vent Mode: PRVC  FiO2 (%):  [40 %-60 %] 40 % Set Rate:  [20 bmp] 20 bmp Vt Set:  [510 mL] 510 mL PEEP:  [5 cmH20] 5 cmH20 Plateau Pressure:  [29 cmH20-36 cmH20] 36 cmH20    Intake/Output Summary (Last 24 hours) at 08/20/2019 1006 Last data filed at 08/20/2019 0800 Gross per 24 hour  Intake 357.71 ml  Output 800 ml  Net -442.29 ml   Filed Weights   08/18/19 0500 08/19/19 0500 08/20/19 0500  Weight: 69.3 kg 68.4 kg 67.2 kg     Examination: General Appearance:  Looks chronic critically ill with deconditioning, cachexia Head:  Normocephalic, without obvious abnormality, atraumatic Eyes:  PERRL - yes, conjunctiva/corneas - muddy     Ears:  Normal external ear canals, both ears Nose:  G tube - no Throat:  ETT TUBE - no , OG tube - no. TRACH + Neck:  Supple,  No enlargement/tenderness/nodules Lungs: Clear to auscultation bilaterally, Ventilator   Synchrony - yes at 40% fio2. Bilateral chest tube + Heart:  S1 and S2 normal, no murmur, CVP - no.  Pressors - no Abdomen:  Soft, no masses, no organomegaly Genitalia / Rectal:  Not done Extremities:  Extremities- intact Skin:  ntact in exposed areas . Sacral area - not examined Neurologic:  Sedation - scheduled oral oxycodone-> RASS - +1 . Moves all 4s - yes. CAM-ICU - not fully tested . Orientation - watching TV and  Seems oriented. 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  Profound deconditioning and severe malnutrition status post prolonged critical illness seem to be the primary barrier to ventilator liberation with very poor mechanics/ high wob c/w FP ARDS.   08/20/2019 - >  Acute on chronic Respiratory Failure due to ARDS. Slow mechanical wean.  Plan - Continue full ventilatory support  @  VT to 8 cc/ kg.    - Assess for readiness to wean daily.  - Will plan for trach change to XLT once FiO2 requirements improve (hopefully AM 5/11). - Hopefully SLP eval once trach has been changed and if we can keep him off  vent. - Continuing prophylactic Bactrim.  - Continue to readdress goals of care.   Spontaneous pneumothoraces - improved s/p chest tubes.  On water seal since 5/7, no air leaks and CXR stable. Tension Pneumomediastinum 08/05/19 Sub cutaneous emphysema, peritoneal free air  PLAN - Get CT chest without contrast - Hold off trach change 08/20/19 pending CT chest - keep chest tubes for prior ptx in place  Hypotension; seemingly responding to treatment of relative adrenal insufficiency.Etiology likely multifactorial, medication related, relative adrenal insufficiency from prolonged critical illness.  Blood pressure remarkably improved after adding stress dose steroids remains off pressors  08/20/2019 - BP ok  Plan - Continue midodrine to 5 mg 3 times daily (on hold for now as no enteral access and NPO). - Change hydrocortisone back to IV until can establish enteral access. - May need maintenance dosing of Solu-Cortef - Resume florinef once enteral access established.  Acute metabolic encephalopathy , resolved. - Fentanyl tachyphylaxis.  plan - Continue PRN dilaudid. - Resume scheduled oxycodone once enteral access established.  Anemia of chronic illness Mild Thrombocytopenia. Hodgkin lymphoma.  - Transfuse per usual guidelines.  Hyperlipidemia. - Continue atorvastatin.  Severe protein calorie malnutrition. - Continue tube feeds once  enteral access established.  Physical deconditioning. - Continue PT. - Will likely need LTAC.- consult palced  Sacral Decubitus ulcers. - Continue wound care.  Goals of care. - Appreciate prior discussion with palliative care, family wishes for full scope.  They have young children (74 & 38 y/o).  Dysphagia - had difficult NG placement at bedside and required sedation with visualization using glidescope. - IR order placed, hopefully can get this done today so that oral meds and tube feeds can be resumed.   Best practice:  Diet: tube feeding  (holding for now as lost enteral access - difficult placement at bedside so IR order placed) Pain/Anxiety/Delirium protocol (if indicated):   VAP protocol (if indicated): yes DVT prophylaxis: lovenox GI prophylaxis: PPI Glucose control: SSI Mobility: bed rest Code Status: full Family Communication: Will attempt to call. Disposition: ICU.  He will eventually need LTAC.       ATTESTATION & SIGNATURE   The patient Steve [name] is critically ill with multiple organ systems failure and requires high complexity decision making for assessment and support, frequent evaluation and titration of therapies, application of advanced monitoring technologies and extensive interpretation of multiple databases.   Critical Care Time devoted to patient care services described in this note is  31  Minutes. This time reflects time of care of this signee Dr Brand Males. This critical care time does not reflect procedure time, or teaching time or supervisory time of PA/NP/Med student/Med Resident etc but could involve care discussion time     Dr. Brand Males, M.D., [facility].C.P Pulmonary and Critical Care Medicine Staff Physician [place] [facility] Pager: [phone], If no answer or between  15:00h - 7:00h: call [phone]  08/20/2019 10:06 AM

## 2019-08-20 NOTE — Progress Notes (Signed)
Assisted with transporting PT to IR and CT and back to ICU while on vent at 100% Fi02- uneventful.

## 2019-08-21 LAB — CBC
HCT: 29.7 % — ABNORMAL LOW (ref 39.0–52.0)
Hemoglobin: 8.8 g/dL — ABNORMAL LOW (ref 13.0–17.0)
MCH: 30.3 pg (ref 26.0–34.0)
MCHC: 29.6 g/dL — ABNORMAL LOW (ref 30.0–36.0)
MCV: 102.4 fL — ABNORMAL HIGH (ref 80.0–100.0)
Platelets: 196 10*3/uL (ref 150–400)
RBC: 2.9 MIL/uL — ABNORMAL LOW (ref 4.22–5.81)
RDW: 17.2 % — ABNORMAL HIGH (ref 11.5–15.5)
WBC: 6.4 10*3/uL (ref 4.0–10.5)
nRBC: 0 % (ref 0.0–0.2)

## 2019-08-21 LAB — COMPREHENSIVE METABOLIC PANEL
ALT: 24 U/L (ref 0–44)
AST: 20 U/L (ref 15–41)
Albumin: 2.5 g/dL — ABNORMAL LOW (ref 3.5–5.0)
Alkaline Phosphatase: 54 U/L (ref 38–126)
Anion gap: 7 (ref 5–15)
BUN: 34 mg/dL — ABNORMAL HIGH (ref 8–23)
CO2: 38 mmol/L — ABNORMAL HIGH (ref 22–32)
Calcium: 8.6 mg/dL — ABNORMAL LOW (ref 8.9–10.3)
Chloride: 96 mmol/L — ABNORMAL LOW (ref 98–111)
Creatinine, Ser: 0.35 mg/dL — ABNORMAL LOW (ref 0.61–1.24)
GFR calc Af Amer: >60 mL/min (ref >60)
GFR calc non Af Amer: >60 mL/min (ref >60)
Glucose, Bld: 129 mg/dL — ABNORMAL HIGH (ref 70–99)
Potassium: 3.4 mmol/L — ABNORMAL LOW (ref 3.5–5.1)
Sodium: 141 mmol/L (ref 135–145)
Total Bilirubin: 0.5 mg/dL (ref 0.3–1.2)
Total Protein: 6.3 g/dL — ABNORMAL LOW (ref 6.5–8.1)

## 2019-08-21 LAB — GLUCOSE, CAPILLARY
Glucose-Capillary: 106 mg/dL — ABNORMAL HIGH (ref 70–99)
Glucose-Capillary: 119 mg/dL — ABNORMAL HIGH (ref 70–99)
Glucose-Capillary: 121 mg/dL — ABNORMAL HIGH (ref 70–99)
Glucose-Capillary: 131 mg/dL — ABNORMAL HIGH (ref 70–99)
Glucose-Capillary: 133 mg/dL — ABNORMAL HIGH (ref 70–99)
Glucose-Capillary: 188 mg/dL — ABNORMAL HIGH (ref 70–99)
Glucose-Capillary: 69 mg/dL — ABNORMAL LOW (ref 70–99)

## 2019-08-21 LAB — MAGNESIUM: Magnesium: 2.1 mg/dL (ref 1.7–2.4)

## 2019-08-21 LAB — PHOSPHORUS: Phosphorus: 3.8 mg/dL (ref 2.5–4.6)

## 2019-08-21 MED ORDER — DEXTROSE 50 % IV SOLN
INTRAVENOUS | Status: AC
  Filled 2019-08-21: qty 50

## 2019-08-21 MED ORDER — DEXTROSE 10 % IV SOLN: INTRAVENOUS | Status: DC

## 2019-08-21 MED ORDER — VANCOMYCIN HCL IN DEXTROSE 1-5 GM/200ML-% IV SOLN: 1000.0000 mg | INTRAVENOUS | Status: AC

## 2019-08-21 MED ORDER — ENOXAPARIN SODIUM 40 MG/0.4ML ~~LOC~~ SOLN
40.0000 mg | SUBCUTANEOUS | Status: DC
  Administered 2019-08-22 – 2019-08-27 (×6): 40 mg via SUBCUTANEOUS
  Filled 2019-08-21 (×5): qty 0.4

## 2019-08-21 MED ORDER — DEXTROSE 50 % IV SOLN
12.5000 g | INTRAVENOUS | Status: AC
  Administered 2019-08-21: 12.5 g via INTRAVENOUS

## 2019-08-21 MED ORDER — CEFAZOLIN SODIUM-DEXTROSE 2-4 GM/100ML-% IV SOLN: 2.0000 g | INTRAVENOUS | Status: DC

## 2019-08-21 NOTE — Progress Notes (Signed)
eLink Physician-Brief Progress Note Patient Name: Steve [name] DOB: 10-11-1955 MRN: [id number]   Date of Service  08/21/2019  HPI/Events of Note  Hypoglycemia - Blood glucose = 69. Panda tube removed for PEG in AM.   eICU Interventions  Will order: 1. D10W to run IV at 30 mL/hour.      Intervention Category Major Interventions: Other:  [name] 08/21/2019, 11:56 PM

## 2019-08-21 NOTE — Progress Notes (Signed)
Re-attempted small bore nasogastric tube placement with bedside RN, as patient had removed tube for discomfort. Patient requesting not to have tube replaced. He states he isn't ready for it to be replaced and might will be tomorrow. Patient educated on need for nutrition and enteral medications, offered to utilize PRN medication for anxiety. Patient wrote "Ativan" on communication paper. Patient understanding and agrees to attempt utilizing Ativan. Patient on telemetry, pulse oximetry, and CO2 detector on NGT. NGT passes easily through oropharynx but continues to slide into trachea past the tracheostomy tube balloon. Tube removed and patient no longer consenting to attempt. Patients wishes respected. Patient states he is ready to have PEG tube placed. He states his wife is also in agreement. Order in place for IR consult for PEG placement. MD aware.

## 2019-08-21 NOTE — Progress Notes (Signed)
NAME:  Steve [name], MRN:  [id number], DOB:  Aug 13, 1955, LOS: 54 ADMISSION DATE:  07/08/2019, CONSULTATION DATE:  3/29 REFERRING MD:  [name]  CHIEF COMPLAINT:  Dyspnea   Brief History   64 y/o male, with hx COVID infection 05/2019, admitted with severe acute respiratory failure with hypoxemia in the setting of receiving chemotherapy for Hodgkin's lymphoma. ICU course prolonged due to ARDS, malnutrition.    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 ARDS. Bilateral PTX s/p CTx2 4/19 Remains on vent 60%/PEEP 8, likely to need trach  4/20 trach placed 4/26 Worsening SQ air, CT with tension pneumomediastinum, chest tube suction increased 4/29 On neo 88mg's 4/30 Weaning neo 5/4 weaning phenylephrine profoundly weak 5/5 add solucortef. Dc sxn to 20 bilateral desaturated, suction increased, but not clear that pneumo was actually worse 5/6: Suction once again reduced.  Stress dose steroids started, phenylephrine discontinued 5/7 chest tubes changed to waterseal, able to titrate midodrine down  5/10 -Slightly worked up after working with PT.  SpO2 in low 90s on 50%.  Has intermittent tachypnea into high 20's and low 30's.  But he nods his head "no" when asked if he is in any pain or distress. Pulled out NG tube overnight.  5/11 - improved down to 40%. IR panda pending. [place] at bedside[place] Says patient has little kids and is fighter and wants full code. Per sister - he is more awake and interactive and significantly better than a week ago.  Ct chest with improved pneumomediastinum.  Significantly fibrotic lung.  Resolved right pneumothorax.  Small left pneumothorax remains.  Still on size  6 tracheostomy.  Plan replaced with glide scope   Consults:  ID >> s/o 4/09 Oncology >> s/o 4/05  Procedures:  4/8 ETT > 4/20 4/20 Trach >> 4/9 bilateral chest tubes  Significant Diagnostic Tests:   CTA chest 3/29 > no PE, diffuse GGO upper lobes, patchy consolidation in bases, peripheral fibrotic changes noted on prior CT chest still present  ECHO 3/30 >LVEF 60 to 614% grade 1 diastolic dysfunction. Mildly dilated LA, normal RV. Trivial MR and TR,otherwise normal valves  CT Intraperitoneal air, likely extension of the pneumomediastinum/pneumothorax. An intra-abdominal etiology such as bowel perforation is favored less likely.Extensive pneumomediastinum and partially visualized right pneumothorax. Diarrheal state. Correlation with clinical exam and stool cultures recommended. No bowel obstruction. Normal appendix. Sigmoid diverticulosis  CT head 4/22 > NAICP, sub cutaneous air in neck, bilateral mastoid effusions CT Chest w/o 4/26 >> tension pneumomediastinum, bilateral chest tubes in place, continued widespread pulmonary opacity with development of some bronchiectasis  Micro Data:  UC 3/29 >>negative  BCx2 3/29 >>negative  PJP 4/2 >>negative  Fungitel 3/29 >> positive Blastomyces Antigen 3/20 >> negative Respiratory Culture 4/4 >> staph epidermis >> S-vanco, clinda BAL 4/8 >>candida glabrata Fungal (BAL) 4/8 >> Candida glabrata PJP (BAL) 4/8 >>negative  Aspergillus antigen (BAL) 4/8 >> Negative Tracheal aspirate 4/29 >> negative  Antimicrobials:  Cefepime 3/29 >>3/31 Vanc 3/29 >>3/30 Azithromycin 3/29 >> 4/1 Fluconazole 3/29 >> 4/13 Meropenem 3/31 >> 4/6 Bactrim 3/31 >> 4/4 Clindamycin 4/5 >>4/20 Primaquine 4/5 >>4/20 Anidulafungin 4/12 >> 4/20 Bactrim (PCP prophylaxis)      Interim history/subjective:   08/21/2019 -remains on 40%  oxygen not much of tracheostomy leak.  Bilateral chest tube persist.  Good upper extremity movement but weak on the lower  extremities although able to move with a power of 1-2/5.  No real change.  Continues to be deconditioned.  Has Panda.  Order for PEG tube placed but pending.   Objective   Blood pressure 121/71, pulse 75, temperature 97.7 F (36.5 C), temperature source Axillary, resp. rate (!) 22, height _0  (1.676 m), weight 72.2 kg, SpO2 100 %.    Vent Mode: PRVC FiO2 (%):  [40 %] 40 % Set Rate:  [20 bmp] 20 bmp Vt Set:  [510 mL] 510 mL PEEP:  [5 cmH20] 5 cmH20 Pressure Support:  [15 cmH20] 15 cmH20 Plateau Pressure:  [23 cmH20-32 cmH20] 32 cmH20   Intake/Output Summary (Last 24 hours) at 08/21/2019 1002 Last data filed at 08/21/2019 0600 Gross per 24 hour  Intake 583.75 ml  Output 600 ml  Net -16.25 ml   Filed Weights   08/19/19 0500 08/20/19 0500 08/21/19 0500  Weight: 68.4 kg 67.2 kg 72.2 kg     Examination: General Appearance:  Looks chronic critically ill with deconditioning, cachexia Head:  Normocephalic, without obvious abnormality, atraumatic Eyes:  PERRL - yes, conjunctiva/corneas - muddy     Ears:  Normal external ear canals, both ears Nose:  G tube - no Throat:  ETT TUBE - no , OG tube - no. TRACH + Neck:  Supple,  No enlargement/tenderness/nodules Lungs: Clear to auscultation bilaterally, Ventilator   Synchrony - yes at 40% fio2. Bilateral chest tube + Heart:  S1 and S2 normal, no murmur, CVP - no.  Pressors - no Abdomen:  Soft, no masses, no organomegaly Genitalia / Rectal:  Not done Extremities:  Extremities- intact Skin:  ntact in exposed areas . Sacral area - not examined Neurologic:  Sedation - scheduled oral oxycodone-> RASS - +1 . Moves all 4s - yes but lower extremity 1-2/5 in upper extremity 5/5.Marland Kitchen [facility]-ICU - not fully tested . Orientation - watching TV and  Seems oriented. Follow commands   Overall no change   Resolved Hospital Problem list   Candida glabrata in BAL PJP pneumonia, suspected, treated for 21 days Hypernatremia  Acute metabolic encephalopathy ,  resolved. - Fentanyl tachyphylaxis.   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  Profound deconditioning and severe malnutrition status post prolonged critical illness seem to be the primary barrier to ventilator liberation with very poor mechanics/ high wob c/w FP ARDS.   08/21/2019 - >  Acute on chronic Respiratory Failure due to ARDS. Slow mechanical wean.  Bilateral chest tubes present.  CT chest 08/20/2019 right pneumothorax resolved but residual left pneumothorax present.  Significant improvement in pneumomediastinum.  Fibrotic lung present.  Concern he might need XLT trach but currently not trach leak   Plan - Continue full ventilatory support  @  VT to 8 cc/ kg.    - Assess for readiness to wean daily.  -We will review need to change tracheostomy to XLT 08/22/2019 along with [name] tracheostomy next practitioner - Continuing prophylactic Bactrim.  - Continue to readdress goals of care.  -As needed Dilaudid  Spontaneous pneumothoraces (course complicated tension ptx 7/00/17) - improved s/p chest tubes.   -  On water seal since 5/7,   08/21/2019 - immproved pneumomediastinum., Small residual ptx on left. Resolved on right  PLAN - -Aim to DC the right-sided chest tube 08/22/2019 -Evaluate for tracheostomy change  08/22/2019  Hypotension; seemingly responding to treatment of relative adrenal insufficiency.Etiology likely multifactorial, medication related, relative adrenal insufficiency from prolonged critical illness.  Blood pressure remarkably improved after adding stress dose steroids remains off pressors  08/21/2019 - BP ok  Plan - Continue midodrine to 5 mg 3 times daily (on hold for now as no enteral access and NPO). - Change hydrocortisone back to IV until can establish enteral access. - May need maintenance dosing of Solu-Cortef - Resume florinef once enteral access established.     Anemia of chronic illness Mild  Thrombocytopenia. Hodgkin lymphoma.   08/21/2019 - stable  plan  - Transfuse per usual guidelines.  Hyperlipidemia. - Continue atorvastatin.  Severe protein calorie malnutrition. - Continue tube feeds once enteral access established.  Physical deconditioning. - Continue PT. - Will likely need LTAC.- consult palced  Sacral Decubitus ulcers. - Continue wound care.  Goals of care. - Appreciate prior discussion with palliative care, family wishes for full scope.  They have young children (70 & 40 y/o).  Dysphagia -remote planned 08/19/2019 and reinserted with glide scope 08/20/2019.  Removed it again 08/21/2019  Plan  -PEG tube consult order placed -If not done early enough might have to repeat Panda placement   Best practice:  Diet: tube feeding (holding for now as lost enteral access -needs etomidate and glide scope for reinsertion] Pain/Anxiety/Delirium protocol (if indicated):   VAP protocol (if indicated): yes DVT prophylaxis: lovenox GI prophylaxis: PPI Glucose control: SSI Mobility: bed rest Code Status: full Family Communication: wife updated over phone 10:27 AM  Disposition: ICU. Needs  LTAC. - > family has selected [facility] awaiting bed per case manager note 08/20/19 (per ccm need not wait for PEG tube to go to [facility])        [place]   The patient Steve [name] is critically ill with multiple organ systems failure and requires high complexity decision making for assessment and support, frequent evaluation and titration of therapies, application of advanced monitoring technologies and extensive interpretation of multiple databases.   Critical Care Time devoted to patient care services described in this note is  31  Minutes. This time reflects time of care of this signee Dr Brand Males. This critical care time does not reflect procedure time, or teaching time or supervisory time of PA/NP/Med student/Med Resident etc but could involve  care discussion time     Dr. Brand Males, M.D., [facility].C.P Pulmonary and Critical Care Medicine Staff Physician [place] [facility] Pager: [phone], If no answer or between  15:00h - 7:00h: call [phone]  08/21/2019 10:02 AM   LABS    PULMONARY Recent Labs  Lab 08/17/19 1129  PHART 7.497*  PCO2ART 68.5*  PO2ART 60.7*  HCO3 52.5*  O2SAT 91.0    CBC Recent Labs  Lab 08/18/19 1110 08/20/19 0616 08/21/19 0000  HGB 8.0* 8.5* 8.8*  HCT 26.5* 28.0* 29.7*  WBC 5.1 5.9 6.4  PLT 179 174 196    COAGULATION No results for input(s): INR in the last 168 hours.  CARDIAC  No results for input(s): TROPONINI in the last 168 hours. No results for input(s): PROBNP in the last 168 hours.   CHEMISTRY Recent Labs  Lab 08/15/19 0355 08/15/19 0355 08/18/19 1110 08/18/19 1110 08/18/19 2148 08/18/19 2148 08/19/19 1413 08/20/19 0616 08/21/19 0424  NA 140  --  139  --  139  --  141 139  --  K 4.5   < > 2.5*   < > 3.3*   < > 3.9 3.4*  --   CL 88*  --  87*  --  88*  --  93* 94*  --   CO2 45*  --  44*  --  42*  --  38* 36*  --   GLUCOSE 175*  --  142*  --  128*  --  110* 127*  --   BUN 28*  --  36*  --  33*  --  28* 22  --   CREATININE <0.30*  --  <0.30*  --  0.31*  --  <0.30* 0.31*  --   CALCIUM 8.8*  --  8.7*  --  8.7*  --  8.9 8.3*  --   MG  --   --   --   --  1.8  --   --  2.2 2.1  PHOS  --   --   --   --  3.2  --   --  4.3 3.8   < > = values in this interval not displayed.   Estimated Creatinine Clearance: 85.3 mL/min (A) (by C-G formula based on SCr of 0.31 mg/dL (L)).   LIVER Recent Labs  Lab 08/18/19 1110  AST 22  ALT 21  ALKPHOS 50  BILITOT 0.6  PROT 5.8*  ALBUMIN 2.3*     INFECTIOUS No results for input(s): LATICACIDVEN, PROCALCITON in the last 168 hours.   ENDOCRINE CBG (last 3)  Recent Labs    08/21/19 0006 08/21/19 0335 08/21/19 0754  GLUCAP 106* 119* 188*         IMAGING x48h  -  image(s) personally visualized  -   highlighted in bold CT CHEST WO CONTRAST  Result Date: 08/20/2019 CLINICAL DATA:  Follow-up bilateral pneumothorax and pneumomediastinum. History of COVID-19 pneumonia. Hodgkin's lymphoma. EXAM: CT CHEST WITHOUT CONTRAST TECHNIQUE: Multidetector CT imaging of the chest was performed following the standard protocol without IV contrast. COMPARISON:  CT chest 08/05/2019 FINDINGS: Cardiovascular: Mild cardiac enlargement. Aortic atherosclerosis. No pericardial effusion. Mediastinum/Nodes: Tracheostomy tube tip is in satisfactory position above the carina. Right paratracheal adenopathy is identified. High right paratracheal lymph node measures 2.2 cm, unchanged from previous exam. No axillary or supraclavicular adenopathy. Significant interval reduction in volume of previously noted marked pneumomediastinum. No significant posterior mediastinal gas identified particularly in the region of the esophagus Lungs/Pleura: Bilateral chest tubes remain in place. The right-sided pneumothorax has resolved. Small left sided pneumothorax overlies the anterior and medial left upper lobe. Severe, progressive fibrotic interstitial lung disease is again noted including diffuse cylindrical bronchiectasis, interstitial reticulation, peripheral subpleural consolidation and ground-glass attenuation. Upper Abdomen: No acute abnormality. Musculoskeletal: Thoracic spondylosis. No acute or suspicious bone lesions. IMPRESSION: 1. Significant interval reduction in volume of previously noted marked pneumomediastinum. Subcutaneous gas is also resolved in the interval. 2. Bilateral chest tubes remain in place. Right pneumothorax has resolved. Small left sided pneumothorax remains. 3. Severe, progressive fibrotic interstitial lung changes. 4. Stable mediastinal adenopathy. 5. Aortic atherosclerosis. Aortic Atherosclerosis (ICD10-I70.0). These results were called by telephone at the time of interpretation on  08/20/2019 at 4:17 pm to provider [facility] , who verbally acknowledged these results. Electronically Signed   By: Kerby Moors M.D.   On: 08/20/2019 16:19   DG Abd Portable 1V  Result Date: 08/20/2019 CLINICAL DATA:  NG tube placement EXAM: PORTABLE ABDOMEN - 1 VIEW COMPARISON:  08/18/2019 FINDINGS: Feeding tube is in place with the tip  in the stomach. Nonobstructive bowel gas pattern. IMPRESSION: Feeding tube tip in the mid to distal stomach. Electronically Signed   By: Rolm Baptise M.D.   On: 08/20/2019 15:57   DG INTRO LONG GI TUBE  Result Date: 08/20/2019 CLINICAL DATA:  NG tube placement EXAM: FL FEEDING TUBE PLACEMENT FLUOROSCOPY TIME:  Fluoroscopy Time:  16 seconds COMPARISON:  None. FINDINGS: Attempt was made to place nasogastric tube. The tube consistently went into the airway and caught on the tracheostomy balloon. IMPRESSION: Unsuccessful fluoroscopic guided feeding tube placement. Electronically Signed   By: Macy Mis M.D.   On: 08/20/2019 14:41

## 2019-08-21 NOTE — Progress Notes (Signed)
Pt pulled NG/panda out. Pt reported it was not comfortable and he wanted it out. I educated pt on importance of panda, as it allows for nutrition and medications to be administered. Pt shook head in understanding.CCM MD notified

## 2019-08-22 ENCOUNTER — Inpatient Hospital Stay (HOSPITAL_COMMUNITY): Payer: BC Managed Care – PPO

## 2019-08-22 DIAGNOSIS — Z9911 Dependence on respirator [ventilator] status: Secondary | ICD-10-CM

## 2019-08-22 HISTORY — PX: IR GASTROSTOMY TUBE MOD SED: IMG625

## 2019-08-22 LAB — PHOSPHORUS: Phosphorus: 2.3 mg/dL — ABNORMAL LOW (ref 2.5–4.6)

## 2019-08-22 LAB — BASIC METABOLIC PANEL
Anion gap: 6 (ref 5–15)
BUN: 18 mg/dL (ref 8–23)
CO2: 35 mmol/L — ABNORMAL HIGH (ref 22–32)
Calcium: 8.4 mg/dL — ABNORMAL LOW (ref 8.9–10.3)
Chloride: 95 mmol/L — ABNORMAL LOW (ref 98–111)
Creatinine, Ser: 0.35 mg/dL — ABNORMAL LOW (ref 0.61–1.24)
GFR calc Af Amer: >60 mL/min (ref >60)
GFR calc non Af Amer: >60 mL/min (ref >60)
Glucose, Bld: 168 mg/dL — ABNORMAL HIGH (ref 70–99)
Potassium: 3.2 mmol/L — ABNORMAL LOW (ref 3.5–5.1)
Sodium: 136 mmol/L (ref 135–145)

## 2019-08-22 LAB — CBC WITH DIFFERENTIAL/PLATELET
Abs Immature Granulocytes: 0.02 10*3/uL (ref 0.00–0.07)
Basophils Absolute: 0 10*3/uL (ref 0.0–0.1)
Basophils Relative: 0 %
Eosinophils Absolute: 0 10*3/uL (ref 0.0–0.5)
Eosinophils Relative: 1 %
HCT: 29.6 % — ABNORMAL LOW (ref 39.0–52.0)
Hemoglobin: 9.1 g/dL — ABNORMAL LOW (ref 13.0–17.0)
Immature Granulocytes: 0 %
Lymphocytes Relative: 23 %
Lymphs Abs: 1.5 10*3/uL (ref 0.7–4.0)
MCH: 31.2 pg (ref 26.0–34.0)
MCHC: 30.7 g/dL (ref 30.0–36.0)
MCV: 101.4 fL — ABNORMAL HIGH (ref 80.0–100.0)
Monocytes Absolute: 0.3 10*3/uL (ref 0.1–1.0)
Monocytes Relative: 4 %
Neutro Abs: 4.7 10*3/uL (ref 1.7–7.7)
Neutrophils Relative %: 72 %
Platelets: 172 10*3/uL (ref 150–400)
RBC: 2.92 MIL/uL — ABNORMAL LOW (ref 4.22–5.81)
RDW: 16.3 % — ABNORMAL HIGH (ref 11.5–15.5)
WBC: 6.6 10*3/uL (ref 4.0–10.5)
nRBC: 0 % (ref 0.0–0.2)

## 2019-08-22 LAB — GLUCOSE, CAPILLARY
Glucose-Capillary: 103 mg/dL — ABNORMAL HIGH (ref 70–99)
Glucose-Capillary: 128 mg/dL — ABNORMAL HIGH (ref 70–99)
Glucose-Capillary: 145 mg/dL — ABNORMAL HIGH (ref 70–99)
Glucose-Capillary: 164 mg/dL — ABNORMAL HIGH (ref 70–99)
Glucose-Capillary: 217 mg/dL — ABNORMAL HIGH (ref 70–99)
Glucose-Capillary: 37 mg/dL — CL (ref 70–99)
Glucose-Capillary: 98 mg/dL (ref 70–99)

## 2019-08-22 LAB — PROTIME-INR
INR: 1.1 (ref 0.8–1.2)
Prothrombin Time: 13.9 seconds (ref 11.4–15.2)

## 2019-08-22 LAB — MAGNESIUM: Magnesium: 1.9 mg/dL (ref 1.7–2.4)

## 2019-08-22 MED ORDER — FENTANYL CITRATE (PF) 100 MCG/2ML IJ SOLN
INTRAMUSCULAR | Status: AC
  Filled 2019-08-22: qty 2

## 2019-08-22 MED ORDER — LIDOCAINE HCL (PF) 1 % IJ SOLN
INTRAMUSCULAR | Status: AC | PRN
  Administered 2019-08-22: 10 mL via INTRADERMAL

## 2019-08-22 MED ORDER — POTASSIUM CHLORIDE 10 MEQ/50ML IV SOLN
10.0000 meq | INTRAVENOUS | Status: AC
  Administered 2019-08-22 (×6): 10 meq via INTRAVENOUS
  Filled 2019-08-22 (×6): qty 50

## 2019-08-22 MED ORDER — FENTANYL CITRATE (PF) 100 MCG/2ML IJ SOLN
INTRAMUSCULAR | Status: AC | PRN
  Administered 2019-08-22: 50 ug via INTRAVENOUS

## 2019-08-22 MED ORDER — VITAL AF 1.2 CAL PO LIQD
1000.0000 mL | ORAL | Status: DC
  Administered 2019-08-23 – 2019-08-24 (×2): 1000 mL

## 2019-08-22 MED ORDER — MIDAZOLAM HCL 2 MG/2ML IJ SOLN
INTRAMUSCULAR | Status: AC | PRN
  Administered 2019-08-22: 1 mg via INTRAVENOUS

## 2019-08-22 MED ORDER — MAGNESIUM SULFATE 2 GM/50ML IV SOLN
2.0000 g | Freq: Once | INTRAVENOUS | Status: AC
  Administered 2019-08-22: 2 g via INTRAVENOUS
  Filled 2019-08-22: qty 50

## 2019-08-22 MED ORDER — IOHEXOL 300 MG/ML  SOLN: 50.0000 mL | Freq: Once | INTRAMUSCULAR | Status: DC | PRN

## 2019-08-22 MED ORDER — PRO-STAT SUGAR FREE PO LIQD
30.0000 mL | Freq: Two times a day (BID) | ORAL | Status: DC
  Administered 2019-08-23 – 2019-08-28 (×11): 30 mL
  Filled 2019-08-22 (×11): qty 30

## 2019-08-22 MED ORDER — DEXTROSE 50 % IV SOLN
25.0000 g | INTRAVENOUS | Status: AC
  Administered 2019-08-23: 25 g via INTRAVENOUS
  Filled 2019-08-22: qty 50

## 2019-08-22 MED ORDER — LIDOCAINE HCL 1 % IJ SOLN
INTRAMUSCULAR | Status: AC
  Filled 2019-08-22: qty 20

## 2019-08-22 MED ORDER — VANCOMYCIN HCL IN DEXTROSE 1-5 GM/200ML-% IV SOLN
INTRAVENOUS | Status: AC
  Administered 2019-08-22: 1000 mg via INTRAVENOUS
  Filled 2019-08-22: qty 200

## 2019-08-22 MED ORDER — GLUCAGON HCL RDNA (DIAGNOSTIC) 1 MG IJ SOLR
INTRAMUSCULAR | Status: AC
  Filled 2019-08-22: qty 1

## 2019-08-22 MED ORDER — MIDAZOLAM HCL 2 MG/2ML IJ SOLN
INTRAMUSCULAR | Status: AC
  Filled 2019-08-22: qty 2

## 2019-08-22 MED ORDER — GLUCAGON HCL (RDNA) 1 MG IJ SOLR
INTRAMUSCULAR | Status: AC | PRN
  Administered 2019-08-22: .5 mg via INTRAVENOUS

## 2019-08-22 NOTE — Progress Notes (Signed)
K+3.2, Mg 1.9 Replaced per protocol  

## 2019-08-22 NOTE — Progress Notes (Signed)
Nutrition Follow-up  RD working remotely.   DOCUMENTATION CODES:   Non-severe (moderate) malnutrition in context of chronic illness  INTERVENTION:  - once enteral access obtained: Vital AF 1.2 @ 50 ml/hr with 30 ml prostat BID and juven BID. - this regimen will provide 1830 kcal (103% estimated kcal need), 125 grams protein, and 973 ml free water. - free water flush per MD/NP.    NUTRITION DIAGNOSIS:   Moderate Malnutrition related to chronic illness, cancer and cancer related treatments as evidenced by mild fat depletion, mild muscle depletion, percent weight loss. -ongoing  GOAL:   Patient will meet greater than or equal to 90% of their needs -to be met with TF regimen  MONITOR:   Vent status, TF tolerance, Labs, Weight trends, Skin  ASSESSMENT:   64 year-old male with a history of Hodgkin's lymphoma (stage 3, diagnosed 02/2019) on chemotherapy. He follows with Dr. Maylon Peppers in Oncology. His most recent treatment was on 3/8. He presented to the ED with worsening respiratory status and inability to tolerate PO intake recently. He had his chemo delayed this week. He has a history of neutropenic fever in the past. He had a good response to chemo on his first follow up PET scan. His appetite has been poor and he has been receiving IV fluids.  Significant Events: 3/29- admission 3/30- initial RD assessment 4/8- intubation and OGT placement 4/20- tracheostomy; OGT removed and small bore NGT placed in R nare 5/2- small bore NGT replaced in R nare 5/7- chest tubes changed to water seal 5/12- patient pulled small bore NGT  Attempts made at bedside yesterday to replace small bore NGT, but unsuccessful attempts and then patient requesting further attempts not be done. Patient agreeable to PEG placement as of 5/12. Order is in place for IR to place PEG when able (current national shortage on tubes).    Patient is currently intubated on ventilator support MV: 13.8 L/min Temp (24hrs),  Avg:98 F (36.7 C), Min:97.5 F (36.4 C), Max:98.2 F (36.8 C) Propofol: none    Labs reviewed; CBGs: 103, 145, 128 mg/dl, K: 3.2 mmol/l, Cl: 95 mmol/l, creatinine: 0.35 mg/dl, Ca: 8.4 mg/dl, Phos: 2.3 mg/dl. Medications reviewed; 300 mg ferous sulfate per tube/day, 50 mg solu-cortef BID, sliding scale novolog, 6 units lantus/day, 2 g IV Mg sulfate x1 run 5/13, 15 ml multivitamin per tube/day, 40 mg protonix per tube/day, 10 mEq IV Kl x6 runs 5/13. IVF; D10 @ 30 ml/hr (245 kcal)    Diet Order:   Diet Order            Diet NPO time specified  Diet effective midnight              EDUCATION NEEDS:   No education needs have been identified at this time  Skin:  Skin Assessment: Skin Integrity Issues: Skin Integrity Issues:: Stage I, Unstageable DTI: sacrum (4/15) Stage I: R heel (4/19) Unstageable: full thickness to lip (4/15) and sacrum (4/26)  Last BM:  5/12  Height:   Ht Readings from Last 1 Encounters:  08/19/19 '5\' 6"'$  (1.676 m)    Weight:   Wt Readings from Last 1 Encounters:  08/21/19 72.2 kg    Ideal Body Weight:  64.5 kg  BMI:  Body mass index is 25.69 kg/m.  Estimated Nutritional Needs:   Kcal:  1767 kcal  Protein:  125-139 grams (1.8-2 grams/kg)  Fluid:  >/= 2 L/day    Steve Matin, MS, RD, LDN, CNSC Inpatient Clinical Dietitian RD pager #  available in AMION  After hours/weekend pager # available in AMION  

## 2019-08-22 NOTE — Progress Notes (Signed)
NAME:  ARIYON MITTLEMAN, MRN:  [id number], DOB:  28-Jan-1956, LOS: 59 ADMISSION DATE:  07/08/2019, CONSULTATION DATE:  3/29 REFERRING MD:  [name]  CHIEF COMPLAINT:  Dyspnea   Brief History   64 y/o male, with hx COVID infection 05/2019, admitted with severe acute respiratory failure with hypoxemia in the setting of receiving chemotherapy for Hodgkin's lymphoma. ICU course prolonged due to ARDS, malnutrition.    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 ARDS. Bilateral PTX s/p CTx2 4/19 Remains on vent 60%/PEEP 8, likely to need trach  4/20 trach placed 4/26 Worsening SQ air, CT with tension pneumomediastinum, chest tube suction increased 4/29 On neo 18mg's 4/30 Weaning neo 5/4 weaning phenylephrine profoundly weak 5/5 add solucortef. Dc sxn to 20 bilateral desaturated, suction increased, but not clear that pneumo was actually worse 5/6: Suction once again reduced.  Stress dose steroids started, phenylephrine discontinued 5/7 chest tubes changed to waterseal, able to titrate midodrine down  5/10 -Slightly worked up after working with PT.  SpO2 in low 90s on 50%.  Has intermittent tachypnea into high 20's and low 30's.  But he nods his head "no" when asked if he is in any pain or distress. Pulled out NG tube overnight.  5/11 - improved down to 40%. IR panda pending. Sister at bedside- interested in [place] Says patient has little kids and is fighter and wants full code. Per sister - he is more awake and interactive and significantly better than a week ago.  Ct chest with improved pneumomediastinum.  Significantly fibrotic lung.  Resolved right pneumothorax.  Small left pneumothorax remains.  Still on size  6 tracheostomy.  Plan replaced with glide scope 5/12 -remains on 40% oxygen not much of tracheostomy leak.  Bilateral chest tube persist.  Good upper extremity movement but weak on the lower extremities although able to move with a power of 1-2/5.  No real change.  Continues to be deconditioned.  Has Panda.  Order for PEG tube placed but pending.   Consults:  ID >> s/o 4/09 Oncology >> s/o 4/05  Procedures:  4/8 ETT > 4/20 4/20 Trach >> 4/9 bilateral chest tubes  Significant Diagnostic Tests:   CTA chest 3/29 > no PE, diffuse GGO upper lobes, patchy consolidation in bases, peripheral fibrotic changes noted on prior CT chest still present  ECHO 3/30 >LVEF 60 to 648% grade 1 diastolic dysfunction. Mildly dilated LA, normal RV. Trivial MR and TR,otherwise normal valves  CT Intraperitoneal air, likely extension of the pneumomediastinum/pneumothorax. An intra-abdominal etiology such as bowel perforation is favored less likely.Extensive pneumomediastinum and partially visualized right pneumothorax. Diarrheal state. Correlation with clinical exam and stool cultures recommended. No bowel obstruction. Normal appendix. Sigmoid diverticulosis  CT head 4/22 > NAICP, sub cutaneous air in neck, bilateral mastoid effusions CT Chest w/o 4/26 >> tension pneumomediastinum, bilateral chest tubes in place, continued widespread pulmonary opacity with development of some bronchiectasis  Micro Data:  UC 3/29 >>negative  BCx2 3/29 >>negative  PJP 4/2 >>negative  Fungitel 3/29 >> positive Blastomyces Antigen 3/20 >> negative Respiratory Culture 4/4 >> staph epidermis >> S-vanco, clinda BAL 4/8 >>candida glabrata Fungal (BAL) 4/8 >> Candida glabrata PJP (BAL) 4/8 >>negative  Aspergillus antigen (BAL) 4/8 >> Negative Tracheal  aspirate 4/29 >> negative  Antimicrobials:  Cefepime 3/29 >>3/31 Vanc 3/29 >>3/30 Azithromycin 3/29 >> 4/1 Fluconazole 3/29 >> 4/13 Meropenem 3/31 >> 4/6 Bactrim 3/31  >> 4/4 Clindamycin 4/5 >>4/20 Primaquine 4/5 >>4/20 Anidulafungin 4/12 >> 4/20 Bactrim (PCP prophylaxis)      Interim history/subjective:   Looks much better.  He is awake, interactive, getting stronger.  Objective   Blood pressure 127/71, pulse 70, temperature 97.7 F (36.5 C), temperature source Axillary, resp. rate (Abnormal) 25, height _0  (1.676 m), weight 72.2 kg, SpO2 100 %.    Vent Mode: PRVC FiO2 (%):  [40 %] 40 % Set Rate:  [20 bmp] 20 bmp Vt Set:  [510 mL] 510 mL PEEP:  [5 cmH20] 5 cmH20 Pressure Support:  [14 cmH20] 14 cmH20 Plateau Pressure:  [26 cmH20-31 cmH20] 29 cmH20   Intake/Output Summary (Last 24 hours) at 08/22/2019 6237 Last data filed at 08/22/2019 0731 Gross per 24 hour  Intake 569.08 ml  Output 930 ml  Net -360.92 ml   Filed Weights   08/19/19 0500 08/20/19 0500 08/21/19 0500  Weight: 68.4 kg 67.2 kg 72.2 kg     Examination  General this is a frail 64 year old male patient; Remains on full ventilatory support but he is remarkably more interactive than past exam HEENT normocephalic some temporal wasting mucous membranes moist size 6 cuffed tracheostomy in place the incision is unremarkable Pulmonary: Diffuse rales, continues to have accessory use on 40% FiO2.  Bilateral chest tube exam, these remain in place there are no air leaks noted bilaterally Cardiac: Regular rate and rhythm Abdomen: Soft not tender no organomegaly currently not on tube feeds Extremities: Decreased muscle mass, warm, no significant edema, brisk capillary refill, pulses strong Neuro: Awake, oriented today, moving all extremities. GU: Clear    Resolved Hospital Problem list   Candida glabrata in BAL PJP pneumonia, suspected, treated for 21 days Hypernatremia  Acute metabolic encephalopathy , resolved. - Fentanyl tachyphylaxis.   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  Profound deconditioning and severe  malnutrition status post prolonged critical illness seem to be the primary barrier to ventilator liberation with very poor mechanics/ high wob c/w FP ARDS.   Acute on chronic Respiratory Failure due to ARDS, now trach and vent dependent Slow mechanical wean.  Bilateral chest tubes present.  Fibrotic lung present.  Concern he might need XLT trach but currently not trach leak Plan Cont full vent support Will eventually change trach to XLT (distal) given high cuff pressures needed to avoid cuff leak) Cont prophylactic bactrim As needed dilaudid  PRN lasix  Cont vent support at 42m/kg PBW Continue full ventilatory support  @  VT to 8 cc/ kg.    VAP bundle Will likely need vent-SNF   Spontaneous pneumothoraces (course complicated tension ptx 46/28/31 - improved s/p chest tubes. -  On water seal since 5/7, right PTX resolved as per CT on 5/11 there was small residual left PTX Plan Dc right chest tube today Repeat film in am  May be able to dc the left as well.    Hypotension; seemingly responding to treatment of relative adrenal insufficiency.Etiology likely multifactorial, medication related, relative adrenal insufficiency from prolonged critical illness.  Blood pressure remarkably improved after adding stress dose steroids remains off pressors Plan Cont hydrocortisone IV for now Resume florinef Resume midodrine  Will likely need maintenance hydrocortisone   Fluid and electrolyte imbalance: hypokalemia Plan Replace and recheck   Anemia of  chronic illness Mild Thrombocytopenia. Hodgkin lymphoma. -remains stable  plan Trend cbc Transfuse for hgb < 7  Hyperlipidemia. Plan Cont statin .  Severe protein calorie malnutrition. Dysphagia  -pulled out NGT Plan Needs PEG  Cont IV replacement   Physical deconditioning. Plan Cont PT Needs LTAC Cont to maximize nutrition   Sacral Decubitus ulcers. Plan Cont wound care .  Goals of care. - Appreciate prior discussion with  palliative care, family wishes for full scope.  They have young children (2 & 24 y/o).     Best practice:  Diet: tube feeding (holding for now as lost enteral access -needs etomidate and glide scope for reinsertion] Pain/Anxiety/Delirium protocol (if indicated):   VAP protocol (if indicated): yes DVT prophylaxis: lovenox GI prophylaxis: PPI Glucose control: SSI Mobility: bed rest Code Status: full Family Communication: wife updated over phone 10:27 AM  Disposition: ICU. Needs  LTAC. - > family has selected [facility] for LT[facility] awaiting bed per case manager note 08/20/19 (per ccm need not wait for PEG tube to go to [facility])  Erick Colace ACNP-BC [place] Pager # [phone] OR # [phone] if no answer

## 2019-08-22 NOTE — Progress Notes (Signed)
PT Cancellation Note  Patient Details Name: WILLIA LAMPERT MRN: [id number] DOB: 09-07-1955   Cancelled Treatment:    Reason Eval/Treat Not Completed: Medical issues which prohibited therapy, going for PEG per RN.Claretha Cooper 08/22/2019, 1:21 PM [place] Pager [phone] Office [phone]

## 2019-08-22 NOTE — Procedures (Signed)
Interventional Radiology Procedure Note  Procedure: 24 fr pull through g tube  Complications: None  Estimated Blood Loss: min  Findings: Confirmed in the stomach

## 2019-08-22 NOTE — Consult Note (Signed)
Chief Complaint: Patient was seen in consultation today for dysphasia/gastrostomy tube placement.  Referring Physician(s): Brand Males  Supervising Physician: Daryll Brod  Patient Status: [facility] - In-pt  History of Present Illness: Steve [name] is a 64 y.o. male with a past medical history of hypertension, pneumonia, GERD, pancreatitis, diabetes mellitus, Hodgkin lymphoma, COVID-19 infection 2021, septic arthritis of hip, and OSA on CPAP. He has been admitted to [facility] since 07/08/2019 for management of progressive dyspnea following COVID-19 infection. Hospital course complicated by bilateral pneumothorax requiring bilateral chest tube placements 4/8/20201 and progressive respiratory failture requiring tracheostomy placement 07/30/2019. Patient had NG tube placed for nutritional support 08/20/2019, however he removed it secondary to discomfort and requesting for PEG placement rather than NG tube placement at this time.  IR requested by Dr. Chase Caller for possible image-guided percutaneous gastrostomy tube placement. Patient awake and alert sitting in bed, tracheostomy in place. He follows simple commands and answers yes/no questions with head nod appropriately. Complains of dyspnea, chronic, stable with tracheostomy. Denies fever, chills, chest pain, abdominal pain, or headache.  LD Lovenox SQ 08/20/2019 at 1956.   Past Medical History:  Diagnosis Date  . Abscess of muscle 08/10/2011   staph infection of right hip   . Diabetes mellitus without complication ([place])   . GERD (gastroesophageal reflux disease)    rare reflux - no meds for reflux - NO PROBLEM IN PAST SEVERAL YRS  . HCAP (healthcare-associated pneumonia)   . Hip dysplasia, congenital    no surgery as a child for hip dysplasia - has had bilateral hip replacements as an adult  . Hodgkin lymphoma ([place])   . Hypertension   . Pancreatitis   . Postoperative anemia due to acute blood loss 09/07/2012  . Septic arthritis of hip  ([place]) 09/05/2012   PT'S TOTAL HIP JOINT REMOVED - ANTIBIOTIC SPACE PLACED AND PT HAS FINISHED IV ANTIBIOTICS ( PICC LINE REMOVED)  . Sleep apnea    USES CPAP    Past Surgical History:  Procedure Laterality Date  . COLONOSCOPY  04/26/2007  . HERNIA REPAIR     inguinal hernia x3  . IR IMAGING GUIDED PORT INSERTION  03/29/2019  . JOINT REPLACEMENT  2002 & 2007   bilateral hip replacement  . LYMPH NODE BIOPSY Right 03/20/2019   Procedure: DEEP RIGHT SUPRACLAVICULAR LYMPH NODE EXCISION;  Surgeon: Fanny Skates, MD;  Location: [place];  Service: General;  Laterality: Right;  . MULTIPLE EXTRACTIONS WITH ALVEOLOPLASTY  07/28/2011   Procedure: MULTIPLE EXTRACION WITH ALVEOLOPLASTY;  Surgeon: Lenn Cal, DDS;  Location: [facility];  Service: Oral Surgery;  Laterality: N/A;  Extraction of tooth #'s 2,3,4,5,6,11,12,13,15,19,22 with alveoloplasty.  . shoulder repair - right for separation of shoulder    . TEE WITHOUT CARDIOVERSION  07/29/2011   Procedure: TRANSESOPHAGEAL ECHOCARDIOGRAM (TEE);  Surgeon: Josue Hector, MD;  Location: [place];  Service: Cardiovascular;  Laterality: N/A;  . TOTAL HIP REVISION Right 09/05/2012   Procedure: RIGHT HIP RESECTION ARTHROPLASTY WITH ANTIBIOTIC SPACERS;  Surgeon: [name], MD;  Location: [facility];  Service: Orthopedics;  Laterality: Right;  . TOTAL HIP REVISION Right 11/30/2012   Procedure: RIGHT TOTAL HIP ARTHROPLASTY REIMPLANTATION;  Surgeon: [name], MD;  Location: [facility];  Service: Orthopedics;  Laterality: Right;    Allergies: Patient has no known allergies.  Medications: Prior to Admission medications   Medication Sig Start Date End Date Taking? Authorizing Provider  atorvastatin (LIPITOR) 10 MG tablet TAKE 1 TABLET BY MOUTH  EVERY DAY Patient taking differently: Take 10 mg by mouth daily.  04/16/19  Yes Saguier, Percell Miller, PA-C  dexamethasone (DECADRON) 4 MG tablet TAKE 2 TABLETS BY MOUTH ONCE A DAY FOR 3 DAYS AFTER  CHEMO. TAKE WITH FOOD. Patient taking differently: Take 8 mg by mouth See admin instructions. Take 8mg  by mouth once a day for 3 days after chemo. Take with food. 05/22/19  Yes Tish Men, MD  lidocaine-prilocaine (EMLA) cream Apply to affected area once Patient taking differently: Apply 1 application topically daily as needed. Port. Apply to affected area once 03/25/19  Yes Tish Men, MD  LORazepam (ATIVAN) 0.5 MG tablet TAKE 1 TABLET (0.5 MG TOTAL) BY MOUTH EVERY 6 (SIX) HOURS AS NEEDED (NAUSEA OR VOMITING). 04/22/19  Yes Tish Men, MD  metFORMIN (GLUCOPHAGE) 500 MG tablet TAKE 1 TABLET BY MOUTH TWICE A DAY WITH MEALS Patient taking differently: Take 500 mg by mouth 2 (two) times daily with a meal.  02/12/19  Yes Saguier, Percell Miller, PA-C  Multiple Vitamin (MULTIVITAMIN WITH MINERALS) TABS tablet Take 2 tablets by mouth daily. flinstones   Yes [provider]  naproxen sodium (ALEVE) 220 MG tablet Take 440 mg by mouth 2 (two) times daily as needed (pain).   Yes [provider]  ondansetron (ZOFRAN) 8 MG tablet TAKE 1 TAB BY MOUTH 2 (TWO) TIMES DAILY AS NEEDED. START ON THE THIRD DAY AFTER CHEMOTHERAPY. Patient taking differently: Take 8 mg by mouth See admin instructions. 8mg  twice daily as needed for nausea/vomitting on the third day after chemo 05/22/19  Yes Tish Men, MD  prochlorperazine (COMPAZINE) 10 MG tablet Take 1 tablet (10 mg total) by mouth every 6 (six) hours as needed (Nausea or vomiting). 06/12/19  Yes Tish Men, MD  sildenafil (VIAGRA) 100 MG tablet Take 0.5-1 tablets (50-100 mg total) by mouth daily as needed for erectile dysfunction. 06/13/17  Yes Saguier, Percell Miller, PA-C  sodium chloride (OCEAN) 0.65 % SOLN nasal spray Place 1 spray into both nostrils as needed for congestion.   Yes [provider]  ferrous sulfate 325 (65 FE) MG tablet Take 1 tablet (325 mg total) by mouth daily with breakfast. Patient not taking: Reported on 07/08/2019 05/22/19   Bonnielee Haff, MD    guaiFENesin-dextromethorphan [facility] DM) 100-10 MG/5ML syrup Take 5 mLs by mouth every 4 (four) hours as needed for cough. Patient not taking: Reported on 07/08/2019 04/19/19   Tish Men, MD     Family History  Problem Relation Age of Onset  . Melanoma Mother   . Heart attack Mother   . Hyperlipidemia Neg Hx   . Sudden death Neg Hx   . Hypertension Neg Hx   . Diabetes Neg Hx     Social History   Socioeconomic History  . Marital status: Married    Spouse name: Not on file  . Number of children: Not on file  . Years of education: Not on file  . Highest education level: Not on file  Occupational History  . Not on file  Tobacco Use  . Smoking status: Never Smoker  . Smokeless tobacco: Never Used  Substance and Sexual Activity  . Alcohol use: Yes    Comment: rare beer. 5 times a year at most. 2 beers when he drinks.  . Drug use: No  . Sexual activity: Yes    Partners: Female  Other Topics Concern  . 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   Welder/fabricator  2 caffeinated beverages daily   Social Determinants of Health   Financial Resource Strain:   . Difficulty of Paying Living Expenses:   Food Insecurity:   . Worried About Charity fundraiser in the Last Year:   . Arboriculturist in the Last Year:   Transportation Needs:   . Film/video editor (Medical):   Marland Kitchen Lack of Transportation (Non-Medical):   Physical Activity:   . Days of Exercise per Week:   . Minutes of Exercise per Session:   Stress:   . Feeling of Stress :   Social Connections:   . Frequency of Communication with Friends and Family:   . Frequency of Social Gatherings with Friends and Family:   . Attends Religious Services:   . Active Member of Clubs or Organizations:   . Attends Archivist Meetings:   Marland Kitchen Marital Status:      Review of Systems: A 12 point ROS discussed and pertinent positives are indicated in the HPI above.  All other systems are  negative.  Review of Systems  Constitutional: Negative for chills and fever.  Respiratory: Positive for shortness of breath. Negative for wheezing.   Cardiovascular: Negative for chest pain and palpitations.  Gastrointestinal: Negative for abdominal pain.  Neurological: Negative for headaches.  Psychiatric/Behavioral: Negative for behavioral problems and confusion.    Vital Signs: BP (!) 148/79   Pulse 71   Temp 98 F (36.7 C) (Axillary)   Resp (!) 21   Ht 5\' 6"  (1.676 m)   Wt 159 lb 2.8 oz (72.2 kg)   SpO2 100%   BMI 25.69 kg/m   Physical Exam Vitals and nursing note reviewed.  Constitutional:      General: He is not in acute distress.    Comments: Tracheostomy.  Cardiovascular:     Rate and Rhythm: Normal rate and regular rhythm.     Heart sounds: Normal heart sounds. No murmur.  Pulmonary:     Effort: Pulmonary effort is normal. No respiratory distress.     Breath sounds: Normal breath sounds. No wheezing.     Comments: Tracheostomy. Skin:    General: Skin is warm and dry.  Neurological:     Mental Status: He is alert.      MD Evaluation Airway: Other (comments) Airway comments: Tracheostomy Heart: WNL Abdomen: WNL Chest/ Lungs: WNL ASA  Classification: 3 Mallampati/Airway Score: Three(Tracheostomy)   Imaging: CT ABDOMEN PELVIS WO CONTRAST  Result Date: 07/30/2019 CLINICAL DATA:  64 year old male with concern for peritonitis or bowel perforation. EXAM: CT ABDOMEN AND PELVIS WITHOUT CONTRAST TECHNIQUE: Multidetector CT imaging of the abdomen and pelvis was performed following the standard protocol without IV contrast. COMPARISON:  Abdominal radiograph dated 07/30/2019 and CT dated 02/28/2019. chest radiograph dated 07/30/2019. Chest CT dated 07/08/2019 FINDINGS: Evaluation of this exam is limited in the absence of intravenous contrast. Evaluation is also limited due to streak artifact caused by patient's arms. Lower chest: Diffuse pulmonary ground-glass  opacities with air bronchogram. Partially visualized bilateral chest tubes and right pneumothorax. A central venous line partially visualized at the cavoatrial junction. There is extensive pneumomediastinum. There is infra diaphragmatic extension of the pneumomediastinum with air dissecting the tissue planes along the anterior pleural surfaces and extending inferiorly into the pelvis. Additional pockets of air in the right upper quadrant also likely extension from the pneumothorax and pneumomediastinum. No free fluid. Hepatobiliary: Patient the liver is grossly unremarkable. No intrahepatic biliary ductal dilatation. The gallbladder is mildly distended. Faint high  attenuating content within the gallbladder may represent sludge or vicarious excretion of recently administered contrast. No pericholecystic fluid. Pancreas: Unremarkable. No pancreatic ductal dilatation or surrounding inflammatory changes. Spleen: Normal in size without focal abnormality. Adrenals/Urinary Tract: The adrenal glands are unremarkable. There is a 3 mm nonobstructing right renal inferior pole calculus. No hydronephrosis. The left kidney is unremarkable. The visualized ureters appear unremarkable. The urinary bladder is decompressed around a Foley catheter. Small amount of air within the bladder likely introduced via catheter. Stomach/Bowel: A rectal tube is noted. An enteric tube is seen with tip in the distal stomach. There is sigmoid diverticulosis. There is loose stool throughout the colon compatible with diarrheal state. Correlation with clinical exam and stool cultures recommended. There is no bowel obstruction. The appendix is normal. Vascular/Lymphatic: The abdominal aorta and IVC are grossly unremarkable on this noncontrast CT. No portal venous gas. There is no adenopathy. Reproductive: The prostate and seminal vesicles are grossly unremarkable. Other: Mild diffuse subcutaneous edema. Musculoskeletal: Bilateral hip arthroplasties.  Degenerative changes of the spine. No acute osseous pathology. IMPRESSION: 1. Intraperitoneal air, likely extension of the pneumomediastinum/pneumothorax. An intra-abdominal etiology such as bowel perforation is favored less likely. Clinical correlation is recommended. 2. Extensive pneumomediastinum and partially visualized right pneumothorax. 3. Diarrheal state. Correlation with clinical exam and stool cultures recommended. No bowel obstruction. Normal appendix. 4. Sigmoid diverticulosis. 5. A 3 mm nonobstructing right renal inferior pole calculus. No hydronephrosis. Electronically Signed   By: Anner Crete M.D.   On: 07/30/2019 21:25   DG Chest 1 View  Result Date: 08/11/2019 CLINICAL DATA:  Possible aspiration EXAM: CHEST  1 VIEW COMPARISON:  Chest radiograph from one day prior. FINDINGS: Stable right chest tube with tip in peripheral mid right pleural space. Stable left chest tube with tip in the left mid pleural space. Tracheostomy tube tip overlies the tracheal air column at the thoracic inlet. Left internal jugular Port-A-Cath terminates over the cavoatrial junction. Enteric tube enters stomach with the tip not seen on this image. Stable cardiomediastinal silhouette with normal heart size. Tiny right apical pneumothorax, less than 5%, not seen on prior. Tiny medial apical left pneumothorax, stable. No significant pleural effusions. Extensive patchy opacities in both lungs, not substantially changed. IMPRESSION: 1. Tiny right apical pneumothorax, less than 5%, not seen on prior. Stable right chest tube position. 2. Tiny medial left apical pneumothorax, less than 5%, stable. Stable left chest tube position. 3. Stable extensive patchy opacities in both lungs. Electronically Signed   By: Ilona Sorrel M.D.   On: 08/11/2019 09:52   DG Chest 1 View  Result Date: 08/05/2019 CLINICAL DATA:  Respiratory failure. EXAM: CHEST  1 VIEW COMPARISON:  August 05, 2019 study obtained earlier in the day FINDINGS:  Tracheostomy catheter tip is 6.9 cm above the carina. Port-A-Cath tip is in the superior vena cava. Feeding tube tip is below the diaphragm. There are chest tubes bilaterally. No pneumothorax is demonstrable. There is extensive subcutaneous emphysema. Suspect pneumomediastinum as well, stable. Ill-defined airspace opacity is again noted, primarily in the mid lung regions. No new opacity evident. Heart is upper normal in size with pulmonary vascularity normal. No adenopathy. No bone lesions. IMPRESSION: Extensive subcutaneous emphysema and a degree of pneumomediastinum. No pneumothorax evident. Ill-defined hazy airspace opacity in the mid lung regions bilaterally consistent with either edema or pneumonia. Both entities may be present concurrently. No new opacity. Stable cardiac silhouette. Tube and catheter positions do not appear appreciably changed from earlier in the day. Electronically  Signed   By: Lowella Grip III M.D.   On: 08/05/2019 09:25   DG Abd 1 View  Result Date: 08/18/2019 CLINICAL DATA:  Repositioning of feeding tube. EXAM: ABDOMEN - 1 VIEW COMPARISON:  Aug 15, 2019 FINDINGS: Nonobstructive bowel gas pattern. Feeding catheter overlies the expected location of proximal gastric body. Patchy interstitial and alveolar opacities in bilateral lung bases. IMPRESSION: 1. Feeding catheter overlies the expected location of proximal gastric body. 2. Nonobstructive bowel gas pattern. 3. Patchy interstitial and alveolar opacities in the visualized lung bases. Electronically Signed   By: Fidela Salisbury M.D.   On: 08/18/2019 10:56   DG Abd 1 View  Result Date: 08/15/2019 CLINICAL DATA:  Encounter for feeding tube placement. EXAM: ABDOMEN - 1 VIEW COMPARISON:  08/11/2019 FINDINGS: There is a feeding tube in the upper abdomen. Tube tip is in the expected region of the distal stomach body or gastric antrum. Extensive densities at both lung bases. Patient has a Port-A-Cath with the tip near the SVC and  right atrium junction. IMPRESSION: Feeding tube tip in the distal stomach region. Electronically Signed   By: Markus Daft M.D.   On: 08/15/2019 13:27   DG Abd 1 View  Result Date: 07/30/2019 CLINICAL DATA:  Feeding tube placement. EXAM: ABDOMEN - 1 VIEW COMPARISON:  07/18/2019 FINDINGS: Interval large amount of free peritoneal air. Feeding tube tip in the distal stomach. Normal bowel gas pattern. Lumbar spine degenerative changes. IMPRESSION: 1. Interval large amount of free peritoneal air suspicious for bowel perforation. Based on the appearance, this may represent air that has tracked down from the pleural space. The patient had bilateral subcutaneous emphysema and bilateral chest tubes in place on a portable chest earlier today. 2. Feeding tube tip in the distal stomach. Critical Value/emergent results were called by telephone at the time of interpretation on 07/30/2019 at 7:24 pm to provider Dr. Billy Coast, who verbally acknowledged these results. Electronically Signed   By: Claudie Revering M.D.   On: 07/30/2019 19:30   CT HEAD WO CONTRAST  Result Date: 08/01/2019 CLINICAL DATA:  Focal neuro deficit, greater than 6 hours, stroke suspected. Additional history provided: Respiratory failure, altered mental status. EXAM: CT HEAD WITHOUT CONTRAST TECHNIQUE: Contiguous axial images were obtained from the base of the skull through the vertex without intravenous contrast. COMPARISON:  No pertinent prior studies available for comparison. FINDINGS: Brain: There is no evidence of acute intracranial hemorrhage, intracranial mass, midline shift or extra-axial fluid collection.No demarcated cortical infarction. Vascular: No hyperdense vessel. Skull: Normal. Negative for fracture or focal lesion. Sinuses/Orbits: Subcutaneous gas extends into the orbits bilaterally. Otherwise, no acute orbital abnormality is identified. There is extensive subcutaneous gas within the visualized upper neck and this extends into the  maxillofacial/periorbital soft tissues. No significant paranasal sinus disease at the imaged levels. Partially imaged nasoenteric tube. Large bilateral mastoid effusions IMPRESSION: 1. No evidence of acute intracranial abnormality. 2. Extensive subcutaneous gas within the partially imaged upper neck extending into the bilateral orbits and maxillofacial soft tissues. 3. Large bilateral mastoid effusions. Electronically Signed   By: [name] DO   On: 08/01/2019 17:29   CT CHEST WO CONTRAST  Result Date: 08/20/2019 CLINICAL DATA:  Follow-up bilateral pneumothorax and pneumomediastinum. History of COVID-19 pneumonia. Hodgkin's lymphoma. EXAM: CT CHEST WITHOUT CONTRAST TECHNIQUE: Multidetector CT imaging of the chest was performed following the standard protocol without IV contrast. COMPARISON:  CT chest 08/05/2019 FINDINGS: Cardiovascular: Mild cardiac enlargement. Aortic atherosclerosis. No pericardial effusion. Mediastinum/Nodes: Tracheostomy tube  tip is in satisfactory position above the carina. Right paratracheal adenopathy is identified. High right paratracheal lymph node measures 2.2 cm, unchanged from previous exam. No axillary or supraclavicular adenopathy. Significant interval reduction in volume of previously noted marked pneumomediastinum. No significant posterior mediastinal gas identified particularly in the region of the esophagus Lungs/Pleura: Bilateral chest tubes remain in place. The right-sided pneumothorax has resolved. Small left sided pneumothorax overlies the anterior and medial left upper lobe. Severe, progressive fibrotic interstitial lung disease is again noted including diffuse cylindrical bronchiectasis, interstitial reticulation, peripheral subpleural consolidation and ground-glass attenuation. Upper Abdomen: No acute abnormality. Musculoskeletal: Thoracic spondylosis. No acute or suspicious bone lesions. IMPRESSION: 1. Significant interval reduction in volume of previously noted  marked pneumomediastinum. Subcutaneous gas is also resolved in the interval. 2. Bilateral chest tubes remain in place. Right pneumothorax has resolved. Small left sided pneumothorax remains. 3. Severe, progressive fibrotic interstitial lung changes. 4. Stable mediastinal adenopathy. 5. Aortic atherosclerosis. Aortic Atherosclerosis (ICD10-I70.0). These results were called by telephone at the time of interpretation on 08/20/2019 at 4:17 pm to provider [facility] , who verbally acknowledged these results. Electronically Signed   By: Kerby Moors M.D.   On: 08/20/2019 16:19   CT CHEST WO CONTRAST  Addendum Date: 08/05/2019   ADDENDUM REPORT: 08/05/2019 13:22 ADDENDUM: Study discussed by telephone with NP Noe Gens on 08/05/2019 at 1310 hours. Electronically Signed   By: Genevie Ann M.D.   On: 08/05/2019 13:22   Result Date: 08/05/2019 CLINICAL DATA:  64 year old male with COVID-19, ventilated. Bilateral chest tubes in place but worsening subcutaneous air in the face and neck. EXAM: CT CHEST WITHOUT CONTRAST TECHNIQUE: Multidetector CT imaging of the chest was performed following the standard protocol without IV contrast. COMPARISON:  Portable chest earlier today, 08/03/2019. Chest CT 07/08/2019. FINDINGS: Cardiovascular: Large volume new pneumomediastinum. There is a degree of associated cardiac contour deformity, primarily the right heart (series 2, image 106 today versus series 4, image 63 last month). No pericardial effusion. Vascular patency is not evaluated in the absence of IV contrast. Mediastinum/Nodes: Pneumomediastinum, contiguous with the deep space neck and face gas at the thoracic inlet. No mediastinal lymphadenopathy. There are small calcified left hilar lymph nodes again noted. An enteric tube courses through the esophagus and into the visible stomach. Lungs/Pleura: Tracheostomy tube in place with no adverse features. Major airways remain patent, peripheral lung bronchiectasis is news from  last month. Diffuse bilateral lung opacity ranging from ground-glass to peripheral more reticulonodular opacity, and right lower lobe consolidation. There are bilateral chest tubes in place, and the right chest tube appears to be within the pleural space anteriorly. There is a superimposed small to moderate volume right pneumothorax which is most apparent at the level of the inferior hilum and costophrenic sulcus. On the left the chest tube is partially opacified in the lung apex (series 7, image 47) but there is no left pneumothorax. Both chest tubes course anteriorly toward the lung apices. No superimposed pleural effusion. Upper Abdomen: The the scan does not extend below the entire level of the diaphragm, but no definite pneumoperitoneum is identified. However, there is a small volume of retroperitoneal gas on the right around the IVC and kidney (series 2, image [id number]). Negative visible noncontrast liver, gallbladder, spleen, pancreas, adrenal glands, kidneys and bowel. Musculoskeletal: Large volume of subcutaneous gas in the visible bilateral lower neck, supraclavicular regions, and wrapping posteriorly around the shoulders and upper back. No acute osseous abnormality identified. IMPRESSION: 1. Tension Pneumomediastinum: large new  volume of pneumomediastinum since a chest CT 07/08/2019, deforming the right heart contour. No pericardial effusion Mediastinal gas appears contiguous with extensive subcutaneous emphysema in the visible neck, and also with a small volume of retroperitoneal gas in the right upper abdomen. 2. Bilateral chest tubes in place course into the lung apices. Superimposed small to moderate volume of right pneumothorax, most apparent along the medial lung and in the costophrenic angle. No pleural effusion. 3. Continued widespread pulmonary opacity with development of some bronchiectasis since March, but mildly improved ventilation overall. There is some consolidation in the right lower lobe.  Electronically Signed: By: Genevie Ann M.D. On: 08/05/2019 12:47   DG Chest Port 1 View  Result Date: 08/19/2019 CLINICAL DATA:  Respiratory failure with hypoxemia. EXAM: PORTABLE CHEST 1 VIEW COMPARISON:  Aug 18, 2019. FINDINGS: Stable cardiomediastinal silhouette. Tracheostomy tube is unchanged in position. Left subclavian Port-A-Cath is unchanged. Bilateral chest tubes are noted without pneumothorax. Stable bilateral lung opacities are noted concerning for multifocal pneumonia. Bony thorax is unremarkable. IMPRESSION: Stable bilateral lung opacities are noted concerning for multifocal pneumonia. Stable bilateral chest tubes are noted without pneumothorax. Electronically Signed   By: Marijo Conception M.D.   On: 08/19/2019 07:57   DG Chest Port 1 View  Result Date: 08/19/2019 CLINICAL DATA:  64 year old male with aspiration. EXAM: PORTABLE CHEST 1 VIEW COMPARISON:  Chest radiograph dated 08/17/2019. FINDINGS: Tracheostomy and left-sided Port-A-Cath in similar position. Right-sided chest tube is also noted in similar position. Bilateral pulmonary densities with no significant interval change. Stable cardiomediastinal silhouette. No acute osseous pathology. IMPRESSION: No significant interval change. Electronically Signed   By: Anner Crete M.D.   On: 08/19/2019 00:16   DG Chest Port 1 View  Result Date: 08/17/2019 CLINICAL DATA:  Respiratory failure EXAM: PORTABLE CHEST 1 VIEW COMPARISON:  Chest radiograph 08/16/2019 FINDINGS: Tracheostomy tube, feeding tube, and LEFT chest tube unchanged. RIGHT chest tube also. Port in the anterior chest wall with tip in distal SVC. Diffuse fine bilateral airspace disease is similar prior. No pneumothorax. No pleural fluid. IMPRESSION: 1. Stable support apparatus. 2. Stable diffuse fine airspace disease. 3. Bilateral chest tubes without evidence of pneumothorax. Electronically Signed   By: Suzy Bouchard M.D.   On: 08/17/2019 06:23   DG Chest Port 1 View  Result  Date: 08/16/2019 CLINICAL DATA:  Respiratory failure.  Chest tubes. EXAM: PORTABLE CHEST 1 VIEW COMPARISON:  08/15/2019. FINDINGS: Tracheostomy tube, feeding tube, PowerPort catheter, bilateral chest tubes in unchanged position. No pneumothorax. Stable cardiomegaly. Diffuse bilateral pulmonary infiltrates/edema again noted. Slight improvement in aeration right lung noted on today's exam. Small pleural effusions again cannot be excluded. Surgical clips right chest. IMPRESSION: 1. Lines and tubes including bilateral chest tubes in stable position. No pneumothorax. 2. Diffuse bilateral pulmonary infiltrates/edema again noted. Slight improvement in aeration in the right lung noted on today's exam. Small bilateral pleural effusions again cannot be excluded. Electronically Signed   By: Marcello Moores  Register   On: 08/16/2019 08:09   DG Chest Port 1 View  Result Date: 08/15/2019 CLINICAL DATA:  Pneumothorax. EXAM: PORTABLE CHEST 1 VIEW COMPARISON:  Aug 14, 2019. FINDINGS: Stable cardiomediastinal silhouette. Feeding tube is seen entering stomach. Left internal jugular Port-A-Cath is unchanged in position. Tracheostomy tube is in good position. No definite pneumothorax is noted. Bilateral chest tubes are noted. Bilateral lung opacities are noted concerning for pneumonia or possibly edema. Small pleural effusions cannot be excluded. Bony thorax is unremarkable. IMPRESSION: Bilateral chest tubes are noted without  definite pneumothorax. Bilateral lung opacities are noted concerning for pneumonia or possibly edema. Small pleural effusions cannot be excluded. Electronically Signed   By: Marijo Conception M.D.   On: 08/15/2019 07:48   DG Chest Port 1 View  Result Date: 08/14/2019 CLINICAL DATA:  ARDS EXAM: PORTABLE CHEST 1 VIEW COMPARISON:  Radiograph 08/12/2019, CT 08/05/2019 FINDINGS: *Tracheostomy tube in the mid trachea. *Transesophageal tube tip below the level of imaging, beyond the GE junction. *Tunneled left IJ approach  Port-A-Cath tip at the cavoatrial junction. *Bilateral chest tubes in stable position. *Telemetry leads and support devices overlie the chest. Diffuse bilateral heterogeneous airspace opacities improved from comparison radiographs with most pronounced clearing in the right mid to lower lung. Trace residual right apical pneumothorax. The previously seen left apical pneumothorax is not well visualized, possibly resolved though cannot be fully excluded on non upright film. Stable cardiomediastinal contours, partially obscured by opacity. Osseous structures and soft tissues are unchanged from prior with some residual subcutaneous emphysema along right upper chest wall IMPRESSION: 1. Improvement in bilateral heterogeneous airspace opacities. 2. Trace residual right apical pneumothorax. 3. Previously seen left apical pneumothorax is not well visualized, possibly resolved though cannot be fully excluded on non upright film. Electronically Signed   By: Lovena Le M.D.   On: 08/14/2019 05:55   DG Chest Port 1 View  Result Date: 08/12/2019 CLINICAL DATA:  Respiratory failure EXAM: PORTABLE CHEST 1 VIEW COMPARISON:  Aug 12, 2019 FINDINGS: Tracheostomy catheter tip is 6.2 cm above the carina. Feeding tube tip is below the diaphragm. Port-A-Cath tip is at the cavoatrial junction. There is a chest tube on each side. There is a small pneumothorax on each side, stable, without tension component. There is airspace opacity throughout the lungs bilaterally, with essentially no change in the right and slight increase in consolidation in the left lower lung region compared to 1 day prior. Heart size and pulmonary vascular normal. No adenopathy. No bone lesions. IMPRESSION: Small pneumothorax on each side, stable. Tube and catheter positions as described. Multifocal airspace opacity, essentially stable on the right and slightly increased on the left inferiorly. Stable cardiac silhouette. Electronically Signed   By: Lowella Grip  III M.D.   On: 08/12/2019 08:03   DG CHEST PORT 1 VIEW  Result Date: 08/10/2019 CLINICAL DATA:  Chest tubes EXAM: PORTABLE CHEST 1 VIEW COMPARISON:  Chest radiograph from one day prior. FINDINGS: Tracheostomy tube tip overlies the tracheal air column at the thoracic inlet. Enteric tube enters stomach with the tip not seen on this image. Left internal jugular Port-A-Cath terminates over the cavoatrial junction. Stable bilateral chest tubes. Stable cardiomediastinal silhouette with top-normal heart size. No pneumothorax. No pleural effusion. Extensive patchy opacities throughout both lungs, not substantially changed. Stable mild subcutaneous emphysema in the lower right neck. IMPRESSION: 1. No pneumothorax, bilateral chest tubes in place. Well-positioned support structures. 2. Stable extensive patchy opacities throughout both lungs, favor multilobar pneumonia. Electronically Signed   By: Ilona Sorrel M.D.   On: 08/10/2019 06:55   DG CHEST PORT 1 VIEW  Result Date: 08/09/2019 CLINICAL DATA:  Pneumothorax. EXAM: PORTABLE CHEST 1 VIEW COMPARISON:  Chest x-rays dated 08/08/2019. FINDINGS: No residual pneumothorax appreciated on today's exam. Bilateral chest tubes in place. Enteric tube passes below the diaphragm. Tracheostomy tube appears appropriately positioned in the midline. LEFT chest wall Port-A-Cath appears well positioned with tip at the level of the RIGHT atrium. Diffuse bilateral airspace opacities are stable. No pleural effusion is seen. IMPRESSION: 1.  No residual pneumothorax appreciated on today's exam. 2. Diffuse bilateral airspace opacities, compatible with multifocal pneumonia and/or pulmonary edema. 3. Support apparatus appears appropriately positioned. Electronically Signed   By: Franki Cabot M.D.   On: 08/09/2019 11:43   DG CHEST PORT 1 VIEW  Result Date: 08/08/2019 CLINICAL DATA:  Evaluate for pneumothorax. EXAM: PORTABLE CHEST 1 VIEW COMPARISON:  08/08/2019 earlier FINDINGS: Patient is  slightly rotated to the left. Left-sided Port-A-Cath, tracheostomy tube and enteric tube unchanged. Bilateral chest tubes unchanged. Lungs are adequately inflated demonstrate stable bilateral hazy multifocal airspace process. Stable tiny right apical pneumothorax. Cardiomediastinal silhouette and remainder of the exam is unchanged. IMPRESSION: 1.  Stable hazy bilateral multifocal airspace process. 2.  Stable tiny right apical pneumothorax. 3.  Tubes and lines as described. Electronically Signed   By: Marin Olp M.D.   On: 08/08/2019 14:22   DG CHEST PORT 1 VIEW  Result Date: 08/08/2019 CLINICAL DATA:  Respiratory failure. EXAM: PORTABLE CHEST 1 VIEW COMPARISON:  August 07, 2019. FINDINGS: Stable cardiomediastinal silhouette. Tracheostomy tube is in grossly good position. Feeding tube is seen entering stomach. Left-sided Port-A-Cath is unchanged in position. Stable position of right-sided chest tube with minimal right apical pneumothorax. Stable bilateral lung opacities are noted consistent with multifocal pneumonia. Left-sided chest tube is unchanged. Bony thorax is unremarkable. IMPRESSION: Stable support apparatus. Stable bilateral lung opacities are noted consistent with multifocal pneumonia. Stable position of bilateral chest tubes with minimal right apical pneumothorax. Electronically Signed   By: Marijo Conception M.D.   On: 08/08/2019 07:58   DG CHEST PORT 1 VIEW  Result Date: 08/07/2019 CLINICAL DATA:  Respiratory failure. EXAM: PORTABLE CHEST 1 VIEW COMPARISON:  08/06/2019 FINDINGS: Tracheostomy tube tip is above the carina. There is a left chest wall port a catheter with tip in the cavoatrial junction. Feeding tube tip is below the GE junction. Bilateral chest tubes are in place. No visible pneumothorax identified at this time. Stable cardiac silhouette. Bilateral interstitial and airspace densities are not significantly changed from the previous exam. Subcutaneous emphysema within the chest wall  and bilateral supraclavicular regions stable. IMPRESSION: 1. No change in aeration to the lungs compared with previous exam. 2. Stable support apparatus. 3. No pneumothorax identified. Electronically Signed   By: Kerby Moors M.D.   On: 08/07/2019 09:06   DG Chest Port 1 View  Result Date: 08/06/2019 CLINICAL DATA:  Pneumomediastinum. EXAM: PORTABLE CHEST 1 VIEW COMPARISON:  Chest x-rays dated 08/05/2019. Chest CT dated 08/05/2019. FINDINGS: Heart size is grossly stable. Tracheostomy tube in place. Bilateral chest tubes appear stable in position. Enteric tube passes below the diaphragm. LEFT-sided Port-A-Cath appears stable in position with tip at the level of the RIGHT atrium. Persistent extensive subcutaneous emphysema, associated with the pneumomediastinum better demonstrated on earlier chest CT. Diffuse airspace opacities are unchanged in the short-term interval. Probable small RIGHT-sided pneumothorax. IMPRESSION: 1. Stable chest x-ray. Persistent extensive subcutaneous emphysema, associated with the pneumomediastinum better demonstrated on earlier chest CT. 2. Stable bilateral airspace opacities, likely multifocal pneumonia. 3. Probable small RIGHT-sided pneumothorax, better demonstrated on earlier chest CT. Bilateral chest tubes in place. Electronically Signed   By: Franki Cabot M.D.   On: 08/06/2019 09:05   DG Chest Port 1 View  Result Date: 08/05/2019 CLINICAL DATA:  Respiratory failure EXAM: PORTABLE CHEST 1 VIEW COMPARISON:  Two days ago FINDINGS: Tracheostomy tube in place. Feeding tube which at least reaches the stomach. Bilateral chest tube and left-sided port. Extensive soft tissue emphysema which  appears similar. No visible pneumothorax. There is likely upper pneumomediastinum. Prominent left ventricular contour. Diffuse hazy/ground-glass pulmonary opacity. IMPRESSION: 1. ARDS with unchanged pulmonary opacity. 2. Stable hardware positioning. 3. Chest wall emphysema with stable chest tube  positioning. No visible pneumothorax. Electronically Signed   By: Monte Fantasia M.D.   On: 08/05/2019 05:14   DG Chest Port 1 View  Result Date: 08/03/2019 CLINICAL DATA:  Acute respiratory failure. EXAM: PORTABLE CHEST 1 VIEW COMPARISON:  08/01/2019 FINDINGS: Bilateral chest tubes, LEFT Port-A-Cath, tracheostomy tube and feeding tube remain in place. Cardiomediastinal contours are stable. Extensive subcutaneous emphysema is similar to the prior study. Deep sulcus sign on the RIGHT is redemonstrated. Suggestion also of pneumomediastinum. Patchy bilateral interstitial and airspace opacities are unchanged. Visualized skeletal structures are unremarkable. IMPRESSION: 1. No interval change in the appearance of the chest. 2. Extensive subcutaneous emphysema, probable RIGHT pneumothorax and probable pneumomediastinum. 3. Stable support devices. Electronically Signed   By: Zetta Bills M.D.   On: 08/03/2019 07:42   DG CHEST PORT 1 VIEW  Result Date: 08/01/2019 CLINICAL DATA:  64 y/o male, with prior COVID infection 05/2019, admitted with severe acute respiratory failure with hypoxemia in the setting of receiving chemotherapy for Hodgkin's lymphoma. Currently has right side pneumothorax. EXAM: PORTABLE CHEST 1 VIEW COMPARISON:  07/31/2019 and older exams. FINDINGS: Left and right-sided chest tubes, tracheostomy tube, enteric tube and left anterior chest wall Port-A-Cath are stable. Extensive subcutaneous emphysema is unchanged from the prior exam. Air extends along the right lateral aspect of the liver into the deep right lateral costophrenic sulcus. Suspect a small right pneumothorax. No evidence a left pneumothorax. Bilateral interstitial and airspace lung opacities are unchanged from the prior exam. IMPRESSION: 1. Probable small right pneumothorax suggested by air tracking along the lateral margin of the liver shadow in the deep right lateral costophrenic sulcus. 2. Extensive subcutaneous emphysema in  bilateral interstitial and airspace lung opacities are unchanged from the previous exam. No new lung abnormalities. 3. Stable support apparatus. Electronically Signed   By: Lajean Manes M.D.   On: 08/01/2019 14:13   DG CHEST PORT 1 VIEW  Result Date: 07/31/2019 CLINICAL DATA:  Respiratory failure. EXAM: PORTABLE CHEST 1 VIEW COMPARISON:  07/30/2019 FINDINGS: The tracheostomy tube and feeding tubes are stable. The left-sided Port-A-Cath is stable. Bilateral chest tubes in place without definite pneumothorax. Stable subcutaneous emphysema. Persistent interstitial and airspace process in the lungs. No definite pleural effusion. IMPRESSION: 1. Stable support apparatus. No pneumothorax. Stable subcutaneous emphysema. 2. Persistent bilateral interstitial and airspace process. Electronically Signed   By: Marijo Sanes M.D.   On: 07/31/2019 07:03   DG Chest Port 1 View  Result Date: 07/30/2019 CLINICAL DATA:  Status post tracheostomy. EXAM: PORTABLE CHEST 1 VIEW COMPARISON:  Same day. FINDINGS: Stable cardiomegaly. Tracheostomy tube is in grossly good position. Left internal jugular Port-A-Cath is noted in grossly good position. Bilateral chest tubes are noted without definite pneumothorax. Stable subcutaneous emphysema is noted. Stable bilateral lung opacities are noted concerning for multifocal pneumonia. Bony thorax is unremarkable. IMPRESSION: Tracheostomy tube in grossly good position. Bilateral chest tubes are noted without definite pneumothorax. Stable bilateral lung opacities are noted concerning for multifocal pneumonia. Electronically Signed   By: Marijo Conception M.D.   On: 07/30/2019 15:22   DG CHEST PORT 1 VIEW  Result Date: 07/30/2019 CLINICAL DATA:  ARDS EXAM: PORTABLE CHEST 1 VIEW COMPARISON:  07/28/2019 FINDINGS: Stable bilateral chest tubes. Nasogastric tube coursing below the diaphragm. Endotracheal tube with  the tip 4.2 cm above the carina. Port-A-Cath with the tip projecting over the SVC.  Small right pneumothorax measuring less than 10%. No left pneumothorax. Diffuse bilateral interstitial and patchy alveolar airspace opacities. No pleural effusion. Stable cardiomediastinal silhouette. No aggressive osseous lesion. IMPRESSION: 1. Stable bilateral chest tubes. Small right pneumothorax measuring less than 10%. 2. Diffuse bilateral interstitial and patchy alveolar airspace opacities. Differential diagnosis includes pulmonary edema versus ARDS. Electronically Signed   By: Kathreen Devoid   On: 07/30/2019 14:05   DG CHEST PORT 1 VIEW  Result Date: 07/29/2019 CLINICAL DATA:  Respiratory difficulty EXAM: PORTABLE CHEST 1 VIEW COMPARISON:  07/28/2019 FINDINGS: Support devices remain in place, unchanged. Bilateral chest tubes in place. No visible pneumothorax. Subcutaneous emphysema, unchanged. Diffuse bilateral airspace disease is stable. No visible effusions. Heart is borderline in size. IMPRESSION: Support devices stable, including bilateral chest tubes. No visible pneumothorax. Bilateral airspace disease, unchanged. Stable subcutaneous emphysema diffusely. Electronically Signed   By: Rolm Baptise M.D.   On: 07/29/2019 00:12   DG Chest Port 1 View  Result Date: 07/28/2019 CLINICAL DATA:  Respiratory failure with hypoxemia EXAM: PORTABLE CHEST 1 VIEW COMPARISON:  1 day prior FINDINGS: Endotracheal tube terminates 4.8 cm above carina. Left-sided Port-A-Cath tip at superior caval/atrial junction. Nasogastric tube extends beyond the inferior aspect of the film. Bilateral chest tubes in place. Normal heart size. No pleural fluid. Extensive subcutaneous emphysema bilaterally. This decreases sensitivity for pneumothorax. Given this limitation, no apical pneumothorax identified. The medial right heart border is well delineated and a inferior medial right-sided pneumothorax cannot be excluded. Similar basilar predominant bilateral airspace disease. IMPRESSION: 1. Bilateral chest tubes in place. Cannot exclude  inferior medial right pneumothorax. 2. Similar bilateral airspace disease. 3. Extensive subcutaneous emphysema. Electronically Signed   By: Abigail Miyamoto M.D.   On: 07/28/2019 06:38   DG CHEST PORT 1 VIEW  Result Date: 07/27/2019 CLINICAL DATA:  On mechanically assisted ventilation. EXAM: PORTABLE CHEST 1 VIEW COMPARISON:  One day prior FINDINGS: Endotracheal tube is unchanged. Left central line terminates at low SVC. Bilateral chest tubes. Nasogastric tube extends beyond the inferior aspect of the film. Mild cardiomegaly. No definite pleural fluid. Previously described left apical pneumothorax is no longer identified. Extensive subcutaneous emphysema about the chest bilaterally. Lower lung predominant airspace disease is not significantly changed. The right hemidiaphragm is well visualized. IMPRESSION: Bilateral chest tubes in place. No residual left apical pneumothorax identified. The right hemidiaphragm is well visualized and a sub pulmonic pneumothorax cannot be excluded. Similar mild cardiomegaly and basilar predominant airspace disease. Aortic Atherosclerosis (ICD10-I70.0). Electronically Signed   By: Abigail Miyamoto M.D.   On: 07/27/2019 06:54   DG Chest Port 1 View  Result Date: 07/26/2019 CLINICAL DATA:  Respiratory failure EXAM: PORTABLE CHEST 1 VIEW COMPARISON:  Yesterday FINDINGS: Endotracheal tube with tip at the clavicular heads. The enteric tube reaches the stomach at least. Left port with tip at the upper cavoatrial junction. Bilateral chest tube in place. Extensive chest wall emphysema. Trace left apical pneumothorax persists. Generalized interstitial and ground-glass opacity. Stable generous heart size IMPRESSION: Stable hardware positioning, ground-glass opacity, and trace left apical pneumothorax. Electronically Signed   By: Monte Fantasia M.D.   On: 07/26/2019 07:39   DG Chest Port 1 View  Result Date: 07/25/2019 CLINICAL DATA:  Respiratory failure.  Chest tubes. EXAM: PORTABLE CHEST 1  VIEW COMPARISON:  07/24/2019. FINDINGS: Surgical clips right upper chest. Endotracheal tube, NG tube, PowerPort catheter, bilateral chest tubes in stable position.  Stable cardiomegaly. Diffuse bilateral airspace disease again noted without interim change. No pleural effusion. Tiny left pneumothorax again may be present. Diffuse chest wall subcutaneous emphysema is again noted. IMPRESSION: 1. Lines and tubes including bilateral chest tubes in stable position. Tiny left pneumothorax again may be present. Diffuse chest wall subcutaneous emphysema again noted. 2.  Stable cardiomegaly. 3.  Diffuse bilateral airspace again noted without interim change. Electronically Signed   By: Marcello Moores  Register   On: 07/25/2019 06:24   DG CHEST PORT 1 VIEW  Result Date: 07/24/2019 CLINICAL DATA:  Subcutaneous emphysema, follow-up chest tube EXAM: PORTABLE CHEST 1 VIEW COMPARISON:  07/22/2019 FINDINGS: Cardiac shadow is enlarged but stable. Left chest wall port, endotracheal tube and gastric catheter are noted in satisfactory position. Bilateral chest tubes are noted in stable position. Persistent and slightly increased subcutaneous emphysema is noted. No definitive pneumothorax is seen at this time. Bilateral airspace opacities are again identified and stable. No bony abnormality is noted. IMPRESSION: Resolution of right-sided pneumothorax. Tubes and lines as described. Stable bilateral airspace opacities. Electronically Signed   By: Inez Catalina M.D.   On: 07/24/2019 21:14   DG Abd Portable 1V  Result Date: 08/20/2019 CLINICAL DATA:  NG tube placement EXAM: PORTABLE ABDOMEN - 1 VIEW COMPARISON:  08/18/2019 FINDINGS: Feeding tube is in place with the tip in the stomach. Nonobstructive bowel gas pattern. IMPRESSION: Feeding tube tip in the mid to distal stomach. Electronically Signed   By: Rolm Baptise M.D.   On: 08/20/2019 15:57   DG Abd Portable 1V  Result Date: 08/11/2019 CLINICAL DATA:  Enteric tube placement EXAM:  PORTABLE ABDOMEN - 1 VIEW COMPARISON:  07/30/2019 abdominal radiograph FINDINGS: Weighted enteric tube tip in the body of the stomach. No dilated small bowel loops. No evidence of pneumatosis or pneumoperitoneum. Patchy bibasilar lung opacities. IMPRESSION: Weighted enteric tube tip in the body of the stomach. Electronically Signed   By: Ilona Sorrel M.D.   On: 08/11/2019 09:49   DG INTRO LONG GI TUBE  Result Date: 08/20/2019 CLINICAL DATA:  NG tube placement EXAM: FL FEEDING TUBE PLACEMENT FLUOROSCOPY TIME:  Fluoroscopy Time:  16 seconds COMPARISON:  None. FINDINGS: Attempt was made to place nasogastric tube. The tube consistently went into the airway and caught on the tracheostomy balloon. IMPRESSION: Unsuccessful fluoroscopic guided feeding tube placement. Electronically Signed   By: Macy Mis M.D.   On: 08/20/2019 14:41    Labs:  CBC: Recent Labs    08/18/19 1110 08/20/19 0616 08/21/19 0000 08/22/19 0431  WBC 5.1 5.9 6.4 6.6  HGB 8.0* 8.5* 8.8* 9.1*  HCT 26.5* 28.0* 29.7* 29.6*  PLT 179 174 196 172    COAGS: Recent Labs    03/08/19 1142 03/29/19 0815 07/30/19 1337 08/22/19 0431  INR 1.1 1.0 1.0 1.1  APTT  --   --  28  --     BMP: Recent Labs    08/19/19 1413 08/20/19 0616 08/21/19 1147 08/22/19 0431  NA 141 139 141 136  K 3.9 3.4* 3.4* 3.2*  CL 93* 94* 96* 95*  CO2 38* 36* 38* 35*  GLUCOSE 110* 127* 129* 168*  BUN 28* 22 34* 18  CALCIUM 8.9 8.3* 8.6* 8.4*  CREATININE <0.30* 0.31* 0.35* 0.35*  GFRNONAA NOT CALCULATED >60 >60 >60  GFRAA NOT CALCULATED >60 >60 >60    LIVER FUNCTION TESTS: Recent Labs    07/30/19 2106 08/03/19 0230 08/18/19 1110 08/21/19 1147  BILITOT 0.6 0.6 0.6 0.5  AST 25 33  22 20  ALT 34 40 21 24  ALKPHOS 88 105 50 54  PROT 6.3* 6.4* 5.8* 6.3*  ALBUMIN 2.4* 2.5* 2.3* 2.5*     Assessment and Plan:  Dysphagia in need of nutritional support. Plan for image-guided percutaneous gastrostomy tube placement tentatively for today  in IR. Patient is NPO. Afebrile and WBCs WNL. Lovenox held per IR protocol. INR 1.1 today.  Risks and benefits discussed with the patient including, but not limited to the need for a barium enema during the procedure, bleeding, infection, peritonitis, or damage to adjacent structures. All of the patient's questions were answered, patient is agreeable to proceed. Consent obtained by patient's wife, Terrion Gencarelli, signed and in chart.   Thank you for this interesting consult.  I greatly enjoyed meeting Steve [name] and look forward to participating in their care.  A copy of this report was sent to the requesting provider on this date.  Electronically Signed: Earley Abide, PA-C 08/22/2019, 12:50 PM   I spent a total of 40 Minutes in face to face in clinical consultation, greater than 50% of which was counseling/coordinating care for dysphasia/gastrostomy tube placement.

## 2019-08-22 NOTE — Progress Notes (Signed)
Assisted [name] NP with removal of Right chest tube. Pt given pain medication prior to removal. Pt tolerated well. Will continue to monitor.

## 2019-08-22 NOTE — TOC Progression Note (Signed)
Transition of Care [facility]) - Progression Note    Patient Details  Name: Steve [name] MRN: [id number] Date of Birth: [birth year]  Transition of [facility] CM/SW Contact  Leeroy Cha, RN Phone Number: 08/22/2019, 8:53 AM  Clinical Narrative:    Remains on vent at 40%fi02 and trached, iv vancomycin, has bilateral chest tubes to suction, awaiting bcbs approval for ltach.   Expected Discharge Plan: Long Term Acute Care (LTAC) Barriers to Discharge: Continued Medical Work up  Expected Discharge Plan and Services Expected Discharge Plan: [place] (LTAC)       Living arrangements for the past 2 months: Single Family Home                                       Social Determinants of Health (SDOH) Interventions    Readmission Risk Interventions Readmission Risk Prevention Plan 07/22/2019  Transportation Screening Complete  Medication Review Press photographer) Complete  Some recent data might be hidden

## 2019-08-22 NOTE — Progress Notes (Signed)
Pt oriented, able to nod appropriately, and communicate via writing and mouthing words. Pt regaining strength in BUE. Pt able to partially help wash self and brush teeth using yaunker. Vast improvement from day/weeks ago.

## 2019-08-22 NOTE — Progress Notes (Signed)
Patient transported to IR for PEG placement on full vent support and FiO2 of 100%. No complications noted during transport or procedure.

## 2019-08-23 ENCOUNTER — Encounter (HOSPITAL_COMMUNITY): Payer: Self-pay | Admitting: Critical Care Medicine

## 2019-08-23 LAB — GLUCOSE, CAPILLARY
Glucose-Capillary: 120 mg/dL — ABNORMAL HIGH (ref 70–99)
Glucose-Capillary: 131 mg/dL — ABNORMAL HIGH (ref 70–99)
Glucose-Capillary: 136 mg/dL — ABNORMAL HIGH (ref 70–99)
Glucose-Capillary: 140 mg/dL — ABNORMAL HIGH (ref 70–99)
Glucose-Capillary: 149 mg/dL — ABNORMAL HIGH (ref 70–99)
Glucose-Capillary: 87 mg/dL (ref 70–99)

## 2019-08-23 LAB — BASIC METABOLIC PANEL
Anion gap: 6 (ref 5–15)
BUN: 13 mg/dL (ref 8–23)
CO2: 33 mmol/L — ABNORMAL HIGH (ref 22–32)
Calcium: 8.5 mg/dL — ABNORMAL LOW (ref 8.9–10.3)
Chloride: 99 mmol/L (ref 98–111)
Creatinine, Ser: 0.33 mg/dL — ABNORMAL LOW (ref 0.61–1.24)
GFR calc Af Amer: >60 mL/min (ref >60)
GFR calc non Af Amer: >60 mL/min (ref >60)
Glucose, Bld: 156 mg/dL — ABNORMAL HIGH (ref 70–99)
Potassium: 3.9 mmol/L (ref 3.5–5.1)
Sodium: 138 mmol/L (ref 135–145)

## 2019-08-23 LAB — MAGNESIUM: Magnesium: 2 mg/dL (ref 1.7–2.4)

## 2019-08-23 LAB — PHOSPHORUS: Phosphorus: 3.1 mg/dL (ref 2.5–4.6)

## 2019-08-23 MED ORDER — JUVEN PO PACK
1.0000 | PACK | Freq: Two times a day (BID) | ORAL | Status: DC
  Administered 2019-08-23 – 2019-08-28 (×11): 1
  Filled 2019-08-23 (×10): qty 1

## 2019-08-23 MED ORDER — HYDROCORTISONE NA SUCCINATE PF 100 MG IJ SOLR
50.0000 mg | Freq: Every day | INTRAMUSCULAR | Status: DC
  Administered 2019-08-24 – 2019-08-27 (×4): 50 mg via INTRAVENOUS
  Filled 2019-08-23 (×5): qty 2

## 2019-08-23 MED ORDER — SERTRALINE HCL 50 MG PO TABS
50.0000 mg | ORAL_TABLET | Freq: Every day | ORAL | Status: DC
  Administered 2019-08-23 – 2019-08-28 (×6): 50 mg
  Filled 2019-08-23 (×6): qty 1

## 2019-08-23 MED ORDER — MIDODRINE HCL 5 MG PO TABS
5.0000 mg | ORAL_TABLET | Freq: Three times a day (TID) | ORAL | Status: DC
  Administered 2019-08-23 – 2019-08-28 (×18): 5 mg
  Filled 2019-08-23 (×19): qty 1

## 2019-08-23 NOTE — Progress Notes (Signed)
eLink Physician-Brief Progress Note Patient Name: Steve [name] DOB: 01-26-1956 MRN: [id number]   Date of Service  08/23/2019  HPI/Events of Note  Notified of CBG 40, remains NPO  eICU Interventions  Increased D10 infusion while NPO     Intervention Category Major Interventions: Other:  Judd Lien 08/23/2019, 12:11 AM

## 2019-08-23 NOTE — Progress Notes (Signed)
Patient ID: Steve [name], male   DOB: 05-Feb-1956, 64 y.o.   MRN: [id number] Patient's gastrostomy tube intact, insertion site mildly tender to palpation but no significant drainage or erythema, abdominal binder in place, abdomen soft; okay to use tube as needed

## 2019-08-23 NOTE — Progress Notes (Signed)
NAME:  Steve [name], MRN:  [id number], DOB:  1955/07/23, LOS: 63 ADMISSION DATE:  07/08/2019, CONSULTATION DATE:  3/29 REFERRING MD:  [name]  CHIEF COMPLAINT:  Dyspnea   Brief History   64 y/o male, with hx COVID infection 05/2019, admitted with severe acute respiratory failure with hypoxemia in the setting of receiving chemotherapy for Hodgkin's lymphoma. ICU course prolonged due to ARDS, malnutrition.    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 ARDS. Bilateral PTX s/p CTx2 4/19 Remains on vent 60%/PEEP 8, likely to need trach  4/20 trach placed 4/26 Worsening SQ air, CT with tension pneumomediastinum, chest tube suction increased 4/29 On neo 47mg's 4/30 Weaning neo 5/4 weaning phenylephrine profoundly weak 5/5 add solucortef. Dc sxn to 20 bilateral desaturated, suction increased, but not clear that pneumo was actually worse 5/6: Suction once again reduced.  Stress dose steroids started, phenylephrine discontinued 5/7 chest tubes changed to waterseal, able to titrate midodrine down  5/10 -Slightly worked up after working with PT.  SpO2 in low 90s on 50%.  Has intermittent tachypnea into high 20's and low 30's.  But he nods his head "no" when asked if he is in any pain or distress. Pulled out NG tube overnight.  5/11 - improved down to 40%. IR panda pending. Sister at bedside- interested in [place] Says patient has little kids and is fighter and wants full code. Per sister - he is more awake and interactive and significantly better than a week ago.  Ct chest with improved pneumomediastinum.  Significantly fibrotic lung.  Resolved right pneumothorax.  Small left pneumothorax remains.  Still on size  6 tracheostomy.  Plan replaced with glide scope\  5/12 -remains on 40% oxygen not much of tracheostomy leak.  Bilateral chest tube persist.  Good upper extremity movement but weak on the lower extremities although able to move with a power of 1-2/5.  No real change.  Continues to be deconditioned.  Has Panda.  Order for PEG tube placed but pending.   5/13 - R Chest tube removed. And s/p PEG /. Slolwly better stronger  Consults:  ID >> s/o 4/09 Oncology >> s/o 4/05  Procedures:  4/8 ETT > 4/20 4/20 Trach >> 4/9 bilateral chest tubes  Significant Diagnostic Tests:   CTA chest 3/29 > no PE, diffuse GGO upper lobes, patchy consolidation in bases, peripheral fibrotic changes noted on prior CT chest still present  ECHO 3/30 >LVEF 60 to 603% grade 1 diastolic dysfunction. Mildly dilated LA, normal RV. Trivial MR and TR,otherwise normal valves  CT Intraperitoneal air, likely extension of the pneumomediastinum/pneumothorax. An intra-abdominal etiology such as bowel perforation is favored less likely.Extensive pneumomediastinum and partially visualized right pneumothorax. Diarrheal state. Correlation with clinical exam and stool cultures recommended. No bowel obstruction. Normal appendix. Sigmoid diverticulosis  CT head 4/22 > NAICP, sub cutaneous air in neck, bilateral mastoid effusions CT Chest w/o 4/26 >> tension pneumomediastinum, bilateral chest tubes in place, continued widespread pulmonary opacity with development of some bronchiectasis  - Ct chets 5/11 - Ct chest with improved pneumomediastinum.  Significantly fibrotic lung.  Resolved right pneumothorax.  Small left pneumothorax remains. Micro Data:  UC 3/29 >>negative  BCx2 3/29 >>negative  PJP 4/2 >>negative  Fungitel 3/29 >>  positive Blastomyces Antigen 3/20 >> negative Respiratory Culture 4/4 >> staph epidermis >> S-vanco, clinda BAL 4/8 >>candida glabrata Fungal (BAL) 4/8 >> Candida glabrata PJP (BAL) 4/8 >>negative   Aspergillus antigen (BAL) 4/8 >> Negative Tracheal aspirate 4/29 >> negative  Antimicrobials:  Cefepime 3/29 >>3/31 Vanc 3/29 >>3/30 Azithromycin 3/29 >> 4/1 Fluconazole 3/29 >> 4/13 Meropenem 3/31 >> 4/6 Bactrim 3/31 >> 4/4 Clindamycin 4/5 >>4/20 Primaquine 4/5 >>4/20 Anidulafungin 4/12 >> 4/20 Bactrim (PCP prophylaxis)  xxxxxxxxxxx vanc 5/13 x 1 dose for peg     Interim history/subjective:   08/23/2019 - On 40% vent. Feels better. S/p PEG  Yesterday. R chest tube removed. TF via peng pending.   Objective   Blood pressure (!) 101/52, pulse 83, temperature 97.9 F (36.6 C), resp. rate (!) 22, height _0  (1.676 m), weight 65.8 kg, SpO2 100 %.    Vent Mode: PRVC FiO2 (%):  [40 %] 40 % Set Rate:  [20 bmp] 20 bmp Vt Set:  [510 mL] 510 mL PEEP:  [5 cmH20] 5 cmH20 Plateau Pressure:  [28 cmH20-35 cmH20] 30 cmH20   Intake/Output Summary (Last 24 hours) at 08/23/2019 0954 Last data filed at 08/23/2019 0900 Gross per 24 hour  Intake 1271.49 ml  Output 1395 ml  Net -123.51 ml   Filed Weights   08/20/19 0500 08/21/19 0500 08/23/19 0500  Weight: 67.2 kg 72.2 kg 65.8 kg     Examination  General Appearance:  Frail and deconditioned. Watching TV. Stronger than few days ago Head:  Normocephalic, without obvious abnormality, atraumatic Eyes:  PERRL - yes, conjunctiva/corneas - muddy     Ears:  Normal external ear canals, both ears Nose:  G tube - no Throat:  ETT TUBE - no , OG tube - no. TRACH + Neck:  Supple,  No enlargement/tenderness/nodules Lungs: Clear to auscultation bilaterally, Ventilator   Synchrony - yes, 40% fio2 Heart:  S1 and S2 normal, no murmur, CVP - no.  Pressors - no Abdomen:  Soft, no masses, no organomegaly Genitalia / Rectal:  Not done Extremities:  Extremities- intact Skin:  ntact in exposed areas . Sacral area - not examined Neurologic:  Sedation - none -> RASS - +! Marland Kitchen Moves all 4s - yes but 2/5 I nlowers and 5/5 in uppers. CAM-ICU - neg .  Orientation - yes      Resolved Hospital Problem list   Candida glabrata in BAL PJP pneumonia, suspected, treated for 21 days Hypernatremia  Acute metabolic encephalopathy , resolved. - Fentanyl tachyphylaxis.   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  Profound deconditioning and severe malnutrition status post prolonged critical illness seem to be the primary barrier to ventilator liberation with very poor mechanics/ high wob c/w FP ARDS.   Acute on chronic Respiratory Failure due to ARDS, now trach and vent dependent Slow mechanical wean. Fibrotic lung present.   08/23/2019 -  Concern he might need XLT trach but currently not trach leak. LEft chest tube +. Doing ok on vent   Plan Cont full vent support Will eventually change trach to XLT (distal) given high cuff pressures needed to avoid cuff leak) As needed dilaudid  PRN lasix  Cont vent support at 14m/kg PBW Continue full ventilatory support  @  VT to 8 cc/ kg.    VAP bundle Will likely need vent-SNF  Sedation / Pain needs on ventilator  08/23/2019 - on dilaudid prn, scheduled oxycodone atleast since 08/14/19 On Ativan prn and  scheduled zoloft  Plan  - continue same for now - prn qtc monitroing   Opportunistic infection pneumonia - this admit (suspected PJP and cofnirmed candida glabrada)  08/23/2019 - no evidence o finection  Plan - continue proph bactrim - ? When to stop  Spontaneous pneumothoraces (course complicated tension ptx 12/08/92) - improved s/p chest tubes. -  On water seal since 5/7, right PTX resolved as per CT on 5/11 -> tube pulled out 5/13. there was small residual left PTX 5/11  Plan At some point removed left chest tube  - best window is when left Ptx resolves.    Hypotension; seemingly responding to treatment of relative adrenal insufficiency.Etiology likely multifactorial, medication related, relative adrenal insufficiency from prolonged  critical illness.  Blood pressure remarkably improved after adding stress dose steroids remains off pressors  08/23/2019 0 normotensive  Plan Cont hydrocortisone IV bu treduce from bid to daily on 5/14 and then wean off If tolerated v maintain - likely maintain Resumed florinef 5/13 after peg placed Resume midodrine  5/13 after peg placed   Fluid and electrolyte imbalance: hypokalemia Plan Replace and recheck   Anemia of chronic illness Mild Thrombocytopenia. Hodgkin lymphoma. -remains stable  plan Trend cbc Transfuse for hgb < 7 Ferros sulfate  Hyperlipidemia. Plan Cont statin .  Severe protein calorie malnutrition. Dysphagia  -pulled out NGT, sp peg 5/13/211 Plan TF via peg when cleared by IR   Physical deconditioning. Plan Cont PT Needs LTAC Cont to maximize nutrition   Sacral Decubitus ulcers. Plan Cont wound care .  Goals of care. - Appreciate prior discussion with palliative care, family wishes for full scope.  They have young children (4 & 33 y/o).     Best practice:  Diet: tube feeding (holding for now as lost enteral access -needs etomidate and glide scope for reinsertion] Pain/Anxiety/Delirium protocol (if indicated):   VAP protocol (if indicated): yes DVT prophylaxis: lovenox GI prophylaxis: PPI Glucose control: SSI Mobility: bed rest Code Status: full Family Communication: wife [name] updated 10:08 AM 08/23/2019    Disposition: ICU. Needs  LTAC. - > family has selected [facility] for LTach awaiting bed per case manager note 08/20/19 (per ccm need not wait for PEG tube to go to [facility])     SIGNATURE    Dr. Brand Males, M.D., F.C.C.P,  Pulmonary and Critical Care Medicine Staff Physician, [place] Director - Interstitial Lung Disease  Program  Pulmonary [place] at [place], [place], [address]  Pager: [phone], If no answer or between  15:00h - 7:00h: call [phone] Telephone: [phone]  10:07 AM 08/23/2019

## 2019-08-23 NOTE — Progress Notes (Signed)
Hypoglycemic Event  CBG: 40  Treatment: 1 amp D50  Follow-up CBG: Time: 0023 CBG Result:136  Possible Reasons for Event: Pt is NPO  Comments/MD notified:[place]

## 2019-08-23 NOTE — Progress Notes (Signed)
Results for FINLAY, GODBEE (MRN [id number]) as of 08/23/2019 09:55  Ref. Range 08/22/2019 23:40 08/22/2019 23:43 08/23/2019 00:23 08/23/2019 03:58 08/23/2019 07:44  Glucose-Capillary Latest Ref Range: 70 - 99 mg/dL 40 (LL) 37 (LL) 136 (H) 140 (H) 131 (H)  Noted that patient had low CBGs last night of 40 - 37 mg/dl.  Recommend decreasing Novolog correction scale to 2-6 units every 4 hours. If blood sugars continue to be less than 70 mg/dl, may need to decrease Lantus to 4 units daily.  Will continue to monitor blood sugars while in the hospital.  Harvel Ricks RN BSN CDE Diabetes Coordinator Pager: [phone]  8am-5pm

## 2019-08-24 LAB — BASIC METABOLIC PANEL
Anion gap: 6 (ref 5–15)
BUN: 27 mg/dL — ABNORMAL HIGH (ref 8–23)
CO2: 34 mmol/L — ABNORMAL HIGH (ref 22–32)
Calcium: 8.6 mg/dL — ABNORMAL LOW (ref 8.9–10.3)
Chloride: 96 mmol/L — ABNORMAL LOW (ref 98–111)
Creatinine, Ser: 0.33 mg/dL — ABNORMAL LOW (ref 0.61–1.24)
GFR calc Af Amer: >60 mL/min (ref >60)
GFR calc non Af Amer: >60 mL/min (ref >60)
Glucose, Bld: 118 mg/dL — ABNORMAL HIGH (ref 70–99)
Potassium: 3.4 mmol/L — ABNORMAL LOW (ref 3.5–5.1)
Sodium: 136 mmol/L (ref 135–145)

## 2019-08-24 LAB — GLUCOSE, CAPILLARY
Glucose-Capillary: 103 mg/dL — ABNORMAL HIGH (ref 70–99)
Glucose-Capillary: 111 mg/dL — ABNORMAL HIGH (ref 70–99)
Glucose-Capillary: 116 mg/dL — ABNORMAL HIGH (ref 70–99)
Glucose-Capillary: 140 mg/dL — ABNORMAL HIGH (ref 70–99)
Glucose-Capillary: 146 mg/dL — ABNORMAL HIGH (ref 70–99)
Glucose-Capillary: 158 mg/dL — ABNORMAL HIGH (ref 70–99)

## 2019-08-24 LAB — MAGNESIUM: Magnesium: 1.7 mg/dL (ref 1.7–2.4)

## 2019-08-24 LAB — PHOSPHORUS: Phosphorus: 3.5 mg/dL (ref 2.5–4.6)

## 2019-08-24 MED ORDER — POTASSIUM CHLORIDE 20 MEQ PO PACK
40.0000 meq | PACK | Freq: Once | ORAL | Status: AC
  Administered 2019-08-24: 40 meq
  Filled 2019-08-24: qty 2

## 2019-08-24 MED ORDER — VITAL AF 1.2 CAL PO LIQD: 1000.0000 mL | ORAL | Status: DC

## 2019-08-24 NOTE — Progress Notes (Signed)
RT attempted to wean pt again this AM. About 15 mins into wean, pt became very restless/anxious, and HR jumped into the 150's with frequent PVCs/PACs. Ventilator was alarming low respiratory rate. RT flipped pt back over to full support, and RN gave pt PRN ativan. HR remained in 120's with frequent premature beats; confirmed by EKG. Dr. Carlis Abbott notified by this RN, and orders were placed to check a BMP. This RN will continue to carefully monitor pt.

## 2019-08-24 NOTE — Progress Notes (Signed)
NAME:  Steve [name], MRN:  [id number], DOB:  [birth year], LOS: 83 ADMISSION DATE:  07/08/2019, CONSULTATION DATE:  3/29 REFERRING MD:  [name]  CHIEF COMPLAINT:  Dyspnea   Brief History   64 y/o male, with hx COVID infection 05/2019, admitted with severe acute respiratory failure with hypoxemia in the setting of receiving chemotherapy for Hodgkin's lymphoma. ICU course prolonged due to ARDS, malnutrition.    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 ARDS. Bilateral PTX s/p CTx2 4/19 Remains on vent 60%/PEEP 8, likely to need trach  4/20 trach placed 4/26 Worsening SQ air, CT with tension pneumomediastinum, chest tube suction increased 4/29 On neo 85mg's 4/30 Weaning neo 5/4 weaning phenylephrine profoundly weak 5/5 add solucortef. Dc sxn to 20 bilateral desaturated, suction increased, but not clear that pneumo was actually worse 5/6: Suction once again reduced.  Stress dose steroids started, phenylephrine discontinued 5/7 chest tubes changed to waterseal, able to titrate midodrine down  5/10 -Slightly worked up after working with PT.  SpO2 in low 90s on 50%.  Has intermittent tachypnea into high 20's and low 30's.  But he nods his head "no" when asked if he is in any pain or distress. Pulled out NG tube overnight.  5/11 - improved down to 40%. IR panda pending. Sister at bedside- interested in [place] Says patient has little kids and is fighter and wants full code. Per sister - he is more awake and interactive and significantly better than a week ago.  Ct chest with improved pneumomediastinum.  Significantly fibrotic lung.  Resolved right pneumothorax.  Small left pneumothorax remains.  Still on size  6 tracheostomy.  Plan replaced with glide scope\  5/12 -remains on 40% oxygen not much of tracheostomy leak.  Bilateral chest tube persist.  Good upper extremity movement but weak on the lower extremities although able to move with a power of 1-2/5.  No real change.  Continues to be deconditioned.  Has Panda.  Order for PEG tube placed but pending.   5/13 - R Chest tube removed. And s/p PEG /. Slolwly better stronger  Consults:  ID >> s/o 4/09 Oncology >> s/o 4/05  Procedures:  4/8 ETT > 4/20 4/20 Trach >> 4/9 bilateral chest tubes 5/13 PEG  Significant Diagnostic Tests:   CTA chest 3/29 > no PE, diffuse GGO upper lobes, patchy consolidation in bases, peripheral fibrotic changes noted on prior CT chest still present  ECHO 3/30 >LVEF 60 to 642% grade 1 diastolic dysfunction. Mildly dilated LA, normal RV. Trivial MR and TR,otherwise normal valves  CT Intraperitoneal air, likely extension of the pneumomediastinum/pneumothorax. An intra-abdominal etiology such as bowel perforation is favored less likely.Extensive pneumomediastinum and partially visualized right pneumothorax. Diarrheal state. Correlation with clinical exam and stool cultures recommended. No bowel obstruction. Normal appendix. Sigmoid diverticulosis  CT head 4/22 > NAICP, sub cutaneous air in neck, bilateral mastoid effusions CT Chest w/o 4/26 >> tension pneumomediastinum, bilateral chest tubes in place, continued widespread pulmonary opacity with development of some bronchiectasis  - Ct chets 5/11 - Ct chest with improved pneumomediastinum.  Significantly fibrotic lung.  Resolved right pneumothorax.  Small left pneumothorax remains. Micro Data:  UC 3/29 >>negative  BCx2 3/29 >>negative  PJP 4/2 >>negative  Fungitel 3/29 >> positive Blastomyces Antigen 3/20 >> negative Respiratory Culture 4/4 >> staph epidermis >> S-vanco, clinda BAL 4/8 >>candida glabrata Fungal (BAL) 4/8 >> Candida glabrata PJP (BAL) 4/8  >>negative  Aspergillus antigen (BAL) 4/8 >> Negative Tracheal aspirate 4/29 >> negative  Antimicrobials:  Cefepime 3/29 >>3/31 Vanc 3/29 >>3/30 Azithromycin 3/29 >> 4/1 Fluconazole 3/29 >> 4/13 Meropenem 3/31 >> 4/6 Bactrim 3/31 >> 4/4 Clindamycin 4/5 >>4/20 Primaquine 4/5 >>4/20 Anidulafungin 4/12 >> 4/20 Bactrim (PCP prophylaxis)  xxxxxxxxxxx vanc 5/13 x 1 dose for peg     Interim history/subjective:   08/24/2019 - On 40% vent. Feels better. S/p PEG  Yesterday. R chest tube removed. TF via peng pending.   Objective   Blood pressure 90/62, pulse 74, temperature 97.6 F (36.4 C), temperature source Oral, resp. rate 17, height _0  (1.676 m), weight 66.5 kg, SpO2 95 %.    Vent Mode: PRVC FiO2 (%):  [35 %-50 %] 40 % Set Rate:  [20 bmp] 20 bmp Vt Set:  [510 mL] 510 mL PEEP:  [5 cmH20] 5 cmH20 Pressure Support:  [8 cmH20] 8 cmH20 Plateau Pressure:  [16 cmH20-36 cmH20] 36 cmH20   Intake/Output Summary (Last 24 hours) at 08/24/2019 2015 Last data filed at 08/24/2019 1814 Gross per 24 hour  Intake 747.99 ml  Output 1990 ml  Net -1242.01 ml   Filed Weights   08/21/19 0500 08/23/19 0500 08/24/19 0500  Weight: 72.2 kg 65.8 kg 66.5 kg     Examination  General Appearance: Frail appearing elderly man lying in bed intubated, not sedated, sleeping HEENT: Temporal wasting, eyes anicteric Neck: Supple, no bleeding or drainage around tracheostomy. Lungs: Tachypneic, breathing above the set rate on the vent.  No air leak from left-sided chest tube, minimal bloody output.  CTA B Heart: Tachycardic, PVCs, no murmurs Abdomen: Soft, nontender, nondistended.  No bleeding or erythema around PEG.  Abdominal binder Extremities: Minimal muscle mass, no peripheral edema Skin: Minimal bruising, no rashes Neurologic: Sleeping, but arousable to stimulation.  Nods to answer questions.  Globally weak, requires prodding to follow commands.    Resolved Hospital Problem list   Candida  glabrata in BAL PJP pneumonia, suspected, treated for 21 days Hypernatremia  Acute metabolic encephalopathy , resolved. - Fentanyl tachyphylaxis.   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  Profound deconditioning and severe malnutrition status post prolonged critical illness seem to be the primary barrier to ventilator liberation with very poor mechanics/ high wob c/w FP ARDS.   Acute on chronic Respiratory Failure due to ARDS, now trach and vent dependent.  Fibroproliferative changes from ARDS.  Previously significant barotrauma with bilateral pneumothoraces -Continue full vent support, 4 to 8 cc/kg ideal body weight with goal plateau's and 30 driving pressure less than 15.  Titrate PEEP and FiO2 per ARDS protocol. -Daily SAT and SBT.  Failed SBT today due to work of breathing. -Continue left-sided chest tube to waterseal.  Repeat CXR in the morning -VAP prevention bundle -Will need LTACH -Long-term prognosis guarded  Sedation / Pain needs on ventilator  dilaudid prn, scheduled oxycodone. On Ativan prn and scheduled zoloft -Continue pain control; no significant issues with anxiety recently.  Opportunistic infection pneumonia - this admit (suspected PJP and cofnirmed candida glabrada) -Continue prophylactic Bactrim   Hypotension; seemingly responding to treatment of relative adrenal insufficiency.Etiology likely multifactorial, medication related, relative adrenal insufficiency from prolonged critical illness.  Blood pressure remarkably improved after adding stress dose steroids remains  off pressors -Continue hydrocortisone; last tapered on 5/14 -Continue midodrine and fludrocortisone enterally  Fluid and electrolyte imbalance: hypokalemia Plan -Repleted -Continue to monitor  Anemia of chronic illness Mild Thrombocytopeni Hodgkin lymphoma-partially treated -Continue iron supplements -Continue every 3 day CBCs -Transfuse for  hemoglobin less than 7 or hemodynamically significant bleeding -I am very concerned that his partially treated lymphoma will become a life limiting illness eventually  Hyperlipidemia. Plan Continue statin  Severe protein calorie malnutrition. Dysphagia  -pulled out NGT, sp peg 5/13/211 Plan -We will resume tube feeds when appropriate  Physical deconditioning. Plan -Optimize nutrition -We will need LTAC for prolonged rehabilitation  Sacral Decubitus ulcers. Plan Cont wound care .  Goals of care. - Appreciate prior discussion with palliative care, family wishes for full scope.  They have young children (91 & 73 y/o).     Best practice:  Diet: tube feeding (holding for now as lost enteral access -needs etomidate and glide scope for reinsertion] Pain/Anxiety/Delirium protocol (if indicated):   VAP protocol (if indicated): yes DVT prophylaxis: lovenox GI prophylaxis: PPI Glucose control: SSI Mobility: bed rest Code Status: full Family Communication: wife [name]     Disposition: ICU. Needs  LTAC. - > family has selected [facility] for LTach awaiting bed per case manager note 08/20/19 (per ccm need not wait for PEG tube to go to [facility])    This patient is critically ill with multiple organ system failure which requires frequent high complexity decision making, assessment, support, evaluation, and titration of therapies. This was completed through the application of advanced monitoring technologies and extensive interpretation of multiple databases. During this encounter critical care time was devoted to patient care services described in this note for 36 minutes.  Julian Hy, DO 08/24/19 8:25 PM [facility]

## 2019-08-25 ENCOUNTER — Inpatient Hospital Stay (HOSPITAL_COMMUNITY): Payer: BC Managed Care – PPO

## 2019-08-25 LAB — GLUCOSE, CAPILLARY
Glucose-Capillary: 112 mg/dL — ABNORMAL HIGH (ref 70–99)
Glucose-Capillary: 114 mg/dL — ABNORMAL HIGH (ref 70–99)
Glucose-Capillary: 119 mg/dL — ABNORMAL HIGH (ref 70–99)
Glucose-Capillary: 125 mg/dL — ABNORMAL HIGH (ref 70–99)
Glucose-Capillary: 130 mg/dL — ABNORMAL HIGH (ref 70–99)
Glucose-Capillary: 147 mg/dL — ABNORMAL HIGH (ref 70–99)
Glucose-Capillary: 199 mg/dL — ABNORMAL HIGH (ref 70–99)

## 2019-08-25 MED ORDER — VITAL HIGH PROTEIN PO LIQD
1000.0000 mL | ORAL | Status: DC
  Administered 2019-08-25 – 2019-08-26 (×2): 1000 mL

## 2019-08-25 MED ORDER — POTASSIUM CHLORIDE 20 MEQ/15ML (10%) PO SOLN
20.0000 meq | Freq: Once | ORAL | Status: AC
  Administered 2019-08-25: 20 meq
  Filled 2019-08-25: qty 15

## 2019-08-25 NOTE — Progress Notes (Signed)
NAME:  Steve [name], MRN:  [id number], DOB:  Sep 15, 1955, LOS: 60 ADMISSION DATE:  07/08/2019, CONSULTATION DATE:  3/29 REFERRING MD:  [name]  CHIEF COMPLAINT:  Dyspnea   Brief History   64 y/o male, with hx COVID infection 05/2019, admitted with severe acute respiratory failure with hypoxemia in the setting of receiving chemotherapy for Hodgkin's lymphoma. ICU course prolonged due to ARDS, malnutrition.    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 ARDS. Bilateral PTX s/p CTx2 4/19 Remains on vent 60%/PEEP 8, likely to need trach  4/20 trach placed 4/26 Worsening SQ air, CT with tension pneumomediastinum, chest tube suction increased 4/29 On neo 40mg's 4/30 Weaning neo 5/4 weaning phenylephrine profoundly weak 5/5 add solucortef. Dc sxn to 20 bilateral desaturated, suction increased, but not clear that pneumo was actually worse 5/6: Suction once again reduced.  Stress dose steroids started, phenylephrine discontinued 5/7 chest tubes changed to waterseal, able to titrate midodrine down  5/10 -Slightly worked up after working with PT.  SpO2 in low 90s on 50%.  Has intermittent tachypnea into high 20's and low 30's.  But he nods his head "no" when asked if he is in any pain or distress. Pulled out NG tube overnight.  5/11 - improved down to 40%. IR panda pending. Sister at bedside- interested in [facility] Says patient has little kids and is fighter and wants full code. Per sister - he is more awake and interactive and significantly better than a week ago.  Ct chest with improved pneumomediastinum.  Significantly fibrotic lung.  Resolved right pneumothorax.  Small left pneumothorax remains.  Still on size  6 tracheostomy.  Plan replaced with glide scope\  5/12 -remains on 40% oxygen not much of tracheostomy leak.  Bilateral chest tube persist.  Good upper extremity movement but weak on the lower extremities although able to move with a power of 1-2/5.  No real change.  Continues to be deconditioned.  Has Panda.  Order for PEG tube placed but pending.   5/13 - R Chest tube removed. And s/p PEG /. Slolwly better stronger  Consults:  ID >> s/o 4/09 Oncology >> s/o 4/05  Procedures:  4/8 ETT > 4/20 4/20 Trach >> 4/9 bilateral chest tubes 5/13 PEG  Significant Diagnostic Tests:   CTA chest 3/29 > no PE, diffuse GGO upper lobes, patchy consolidation in bases, peripheral fibrotic changes noted on prior CT chest still present  ECHO 3/30 >LVEF 60 to 643% grade 1 diastolic dysfunction. Mildly dilated LA, normal RV. Trivial MR and TR,otherwise normal valves  CT Intraperitoneal air, likely extension of the pneumomediastinum/pneumothorax. An intra-abdominal etiology such as bowel perforation is favored less likely.Extensive pneumomediastinum and partially visualized right pneumothorax. Diarrheal state. Correlation with clinical exam and stool cultures recommended. No bowel obstruction. Normal appendix. Sigmoid diverticulosis  CT head 4/22 > NAICP, sub cutaneous air in neck, bilateral mastoid effusions CT Chest w/o 4/26 >> tension pneumomediastinum, bilateral chest tubes in place, continued widespread pulmonary opacity with development of some bronchiectasis  - Ct chets 5/11 - Ct chest with improved pneumomediastinum.  Significantly fibrotic lung.  Resolved right pneumothorax.  Small left pneumothorax remains. Micro Data:  UC 3/29 >>negative  BCx2 3/29 >>negative  PJP 4/2 >>negative  Fungitel 3/29 >> positive Blastomyces Antigen 3/20 >> negative Respiratory Culture 4/4 >> staph epidermis >> S-vanco, clinda BAL 4/8 >>candida glabrata Fungal (BAL) 4/8 >> Candida glabrata PJP (BAL) 4/8  >>negative  Aspergillus antigen (BAL) 4/8 >> Negative Tracheal aspirate 4/29 >> negative  Antimicrobials:  Cefepime 3/29 >>3/31 Vanc 3/29 >>3/30 Azithromycin 3/29 >> 4/1 Fluconazole 3/29 >> 4/13 Meropenem 3/31 >> 4/6 Bactrim 3/31 >> 4/4 Clindamycin 4/5 >>4/20 Primaquine 4/5 >>4/20 Anidulafungin 4/12 >> 4/20 Bactrim (PCP prophylaxis)  xxxxxxxxxxx vanc 5/13 x 1 dose for peg     Interim history/subjective:   Slept most of the day yesterday after 30 min SBT.  Denies complaints this morning.  Objective   Blood pressure (!) 106/58, pulse (!) 101, temperature 99.4 F (37.4 C), temperature source Oral, resp. rate (!) 30, height _0  (1.676 m), weight 68.4 kg, SpO2 91 %.    Vent Mode: PSV;CPAP FiO2 (%):  [35 %-50 %] 45 % Set Rate:  [20 bmp] 20 bmp Vt Set:  [510 mL] 510 mL PEEP:  [5 cmH20] 5 cmH20 Pressure Support:  [8 cmH20-12 cmH20] 12 cmH20 Plateau Pressure:  [18 cmH20-36 cmH20] 29 cmH20   Intake/Output Summary (Last 24 hours) at 08/25/2019 0756 Last data filed at 08/25/2019 0408 Gross per 24 hour  Intake 89.36 ml  Output 1120 ml  Net -1030.64 ml   Filed Weights   08/23/19 0500 08/24/19 0500 08/25/19 0453  Weight: 65.8 kg 66.5 kg 68.4 kg     Examination  General Appearance: Lying in bed watching TV, more alert compared to yesterday's exam. HEENT: Temporal wasting, eyes anicteric, oral mucosa dry Neck: Supple, no bleeding from tracheostomy Lungs: Tachypneic, breathing and dyssynchronous with coughing.  Rhonchi cleared with suctioning, rales bilaterally, but reduced breath sounds on the left.  Thick clear mucus from trach.  Left chest tube with minimal blood-tinged drainage, no airleak. Heart: Tachycardic, regular rhythm, no murmurs Abdomen: Soft, nontender, nondistended.  Abdominal binder in place.  No bleeding or erythema around PEG. Extremities: Normal muscle mass.  No peripheral edema.   Skin: No rashes Neurologic: Attempting to communicate by talking.   Extremities spontaneously.  Strength appears improved today  CXR 5/16 personally reviewed-residual left apical pneumothorax, chest tube on the left.  Persistent but improving bilateral opacities.  Left-sided Port-A-Cath.  Resolved Hospital Problem list   Candida glabrata in BAL PJP pneumonia, suspected, treated for 21 days Hypernatremia  Acute metabolic encephalopathy , resolved. - Fentanyl tachyphylaxis.   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  Profound deconditioning and severe malnutrition status post prolonged critical illness seem to be the primary barrier to ventilator liberation with very poor mechanics/ high wob c/w FP ARDS.   Acute on chronic Respiratory Failure due to ARDS, now trach and vent dependent.  Fibroproliferative changes from ARDS.  Previously significant barotrauma with bilateral pneumothoraces -Continue low tidal volume ventilation, 4 to 8 cc/kg ideal body 15.  Titrate FiO2 to maintain saturations greater than 88%..  Given history of barotrauma versus more pneumothorax, airway pressure.   -Routine trach care -Daily SBT.  Minimizing sedation as much as possible. -Resume left chest tube to -20 cmH2O pneumothorax since 12/13 -VAP prevention protocol -Will need LTACH for prolonged vent weaning -Long-term prognosis guarded  Sedation / Pain needs on ventilator  dilaudid prn, scheduled oxycodone. On Ativan prn and scheduled zoloft -Continue pain control; no significant issues with anxiety recently.  Opportunistic infection pneumonia - this admit (suspected PJP and cofnirmed candida  glabrada) -Continue prophylactic Bactrim  Hypotension; seemingly responding to treatment of relative adrenal insufficiency. Etiology likely multifactorial, medication related, relative adrenal insufficiency from prolonged critical illness.  Blood pressure remarkably improved after adding stress dose steroids remains off pressors -Continue  hydrocortisone; last tapered on 5/14. -Continue midodrine and fludrocortisone enterally  Fluid and electrolyte imbalance: hypokalemia Plan -Continue to monitor periodically  Anemia of chronic illness Mild Thrombocytopenia Hodgkin lymphoma-partially treated -Continue iron supplements -Continue to monitor CBCs periodically History of history hemoglobin less than 7 with hemodynamically significant bleeding -I am very concerned that his partially treated lymphoma will become a life limiting illness eventually  Hyperlipidemia. Plan -Continue statin  Severe protein calorie malnutrition. Dysphagia  -pulled out NGT, s/p peg 5/13/211 Plan -Will resume trickle tube feeds today.  Can titrate up to goal if he tolerates that for 24 hours.  Physical deconditioning. Plan -optimize nutrition -Will need to LTAC for rehabilitation -Previously did not tolerated PT and OT.  Slept all day yesterday after 30-minute SBT.  Sacral Decubitus ulcers. Plan -Continue wound care  Goals of care. - Appreciate prior discussion with palliative care, family wishes for full scope.  They have young children (30 & 74 y/o).     Best practice:  Diet: tube feeding (holding for now as lost enteral access -needs etomidate and glide scope for reinsertion] Pain/Anxiety/Delirium protocol (if indicated):   VAP protocol (if indicated): yes DVT prophylaxis: lovenox GI prophylaxis: PPI Glucose control: SSI Mobility: bed rest Code Status: full Family Communication: wife [name]     Disposition: ICU. Needs  LTAC. - > family has selected [facility] for [facility] awaiting bed per case manager note 08/20/19 (per ccm need not wait for PEG tube to go to [facility])    This patient is critically ill with multiple organ system failure which requires frequent high complexity decision making, assessment, support, evaluation, and titration of therapies. This was completed through the application of advanced monitoring technologies and  extensive interpretation of multiple databases. During this encounter critical care time was devoted to patient care services described in this note for 40 minutes.  Julian Hy, DO 08/25/19 8:12 AM [facility]

## 2019-08-26 ENCOUNTER — Inpatient Hospital Stay (HOSPITAL_COMMUNITY): Payer: BC Managed Care – PPO

## 2019-08-26 ENCOUNTER — Other Ambulatory Visit: Payer: Self-pay | Admitting: Medical

## 2019-08-26 LAB — CBC
HCT: 30.2 % — ABNORMAL LOW (ref 39.0–52.0)
Hemoglobin: 9 g/dL — ABNORMAL LOW (ref 13.0–17.0)
MCH: 30 pg (ref 26.0–34.0)
MCHC: 29.8 g/dL — ABNORMAL LOW (ref 30.0–36.0)
MCV: 100.7 fL — ABNORMAL HIGH (ref 80.0–100.0)
Platelets: 191 10*3/uL (ref 150–400)
RBC: 3 MIL/uL — ABNORMAL LOW (ref 4.22–5.81)
RDW: 15.9 % — ABNORMAL HIGH (ref 11.5–15.5)
WBC: 8 10*3/uL (ref 4.0–10.5)
nRBC: 0 % (ref 0.0–0.2)

## 2019-08-26 LAB — BASIC METABOLIC PANEL
Anion gap: 4 — ABNORMAL LOW (ref 5–15)
BUN: 30 mg/dL — ABNORMAL HIGH (ref 8–23)
CO2: 39 mmol/L — ABNORMAL HIGH (ref 22–32)
Calcium: 8.6 mg/dL — ABNORMAL LOW (ref 8.9–10.3)
Chloride: 98 mmol/L (ref 98–111)
Creatinine, Ser: <0.3 mg/dL — ABNORMAL LOW (ref 0.61–1.24)
Glucose, Bld: 140 mg/dL — ABNORMAL HIGH (ref 70–99)
Potassium: 3.9 mmol/L (ref 3.5–5.1)
Sodium: 141 mmol/L (ref 135–145)

## 2019-08-26 LAB — GLUCOSE, CAPILLARY
Glucose-Capillary: 130 mg/dL — ABNORMAL HIGH (ref 70–99)
Glucose-Capillary: 152 mg/dL — ABNORMAL HIGH (ref 70–99)
Glucose-Capillary: 141 mg/dL — ABNORMAL HIGH (ref 70–99)
Glucose-Capillary: 140 mg/dL — ABNORMAL HIGH (ref 70–99)
Glucose-Capillary: 107 mg/dL — ABNORMAL HIGH (ref 70–99)
Glucose-Capillary: 108 mg/dL — ABNORMAL HIGH (ref 70–99)

## 2019-08-26 MED ORDER — VITAL AF 1.2 CAL PO LIQD
1000.0000 mL | ORAL | Status: DC
  Administered 2019-08-27 (×2): 1000 mL

## 2019-08-26 NOTE — Procedures (Signed)
Tracheostomy Change Note  Patient Details:   Name: Steve [name] DOB: [birth year] MRN: [id number]    Airway Documentation:     Evaluation  O2 sats: stable throughout Complications: No apparent complications Patient did tolerate procedure well. Bilateral Breath Sounds: Diminished   Pt's trach changed to a Shiley #6 XLT from a Shiley #6 per MD order. No apparent complications at this time.     Esperanza Sheets T 08/26/2019, 1:51 PM

## 2019-08-26 NOTE — Discharge Summary (Addendum)
Physician Discharge Summary  Patient ID: BERWYN Steve [name] MRN: [id number] DOB/AGE: [id number] y.o.  Admit date: 07/08/2019 Discharge date: 08/27/2019    Discharge Diagnoses:  Severe Acute Hypoxic Respiratory Failure  ARDS  Candida glabrata, staph epidermidis pneumonia Suspected PJP Tracheostomy status Bilateral pneumothoraces status post chest tubes Tension Pneumomediastinum Post COVID - 05/2019 OSA Adrenal Insufficiency with associated Shock Acute metabolic encephalopathy Hodgkin's Lymphoma - last chemo 3/8  Severe Deconditioning  Severe Protein Calorie Malnutrition  PEG status Noninfectious diarrhea  Stage II sacral decubitus ulcer DM  HTN GERD                                                                        DISCHARGE PLAN BY DIAGNOSIS      Severe Acute Hypoxic Respiratory Failure  ARDS  Candida glabrata, staph epidermidis pneumonia Suspected PJP PNA Tracheostomy status Bilateral pneumothoraces status post chest tubes Tension Pneumomediastinum Post COVID - 05/2019 OSA  Discharge Plan: -Continue PRVC 8cc/kg -Wean PEEP / FiO2 for sats >90% -Will need slow vent wean as he physically improves -Follow intermittent CXR with chest tube in place -Chest tube placed to water seal 5/18 with follow up CXR showing no residual pneumothorax.  Can remove chest tube soon if CXR continues to be stable -trach care Q Shift and PRN with inner cannula change -Pulmonary hygiene - mobilize  Adrenal Insufficiency with associated Shock -resolved, no acute follow up -if hypotension, consider repeat stress dose steroids  -continue florinef, midodrine for now, consider slow wean off as he improves  Acute metabolic encephalopathy Hodgkin's Lymphoma - last chemo 3/8  Severe Deconditioning   Discharge Plan: -supportive care -follow up with ONC once discharged from [facility]  -push PT/OT efforts  -OOB, mobilize   Severe Protein Calorie Malnutrition  PEG  status Noninfectious diarrhea  GERD  Discharge Plan: -continue TF per Nutrition  -mobilize  -monitor for fever / WBC changes with diarrhea  Stage II sacral decubitus ulcer  Discharge Plan: -continue wound care, small linear decubitus on sacrum with broken skin  -pressure relief measures   DM   Discharge Plan: -continue SSI -continue lantus -hold home metformin while inpatient   HTN   Discharge Plan: -hold home medications                     DISCHARGE SUMMARY   64 year old male, with a past medical history of Hodgkin's lymphoma (stage III +, diagnosed in 02/2019) on chemotherapy (brentuximab vedotin, doxorubicin, vinblastine, dacarbazine) prior to admission, who was admitted 3/29 with reports of increasing shortness of breath, saturations into the 50s on supplemental oxygen at home, sore throat with a white coating and decreased oral intake. The patient reportedly had a good response to chemotherapy after assessment with his first follow-up PET scan.  He is followed by Dr. Maylon Peppers / Oncology.  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  During hospitalization the patient had progressive hypoxemic respiratory failure requiring transfer to the intensive care unit.  Initially there was some question that this could be related to possible chemotherapy effect although less than 5%.  As he progressed it was felt that this was likely post Covid ARDS.  ICU course complicated by Candida glabrata, staph epidermis pneumonia.  There was concern potentially that the patient had PJP pneumonia and he was empirically treated as such due to immunocompromised state.  Cultures did not show PJP but he was already on therapy when sampling was obtained.  Additionally, he developed bilateral pneumothoraces s/p chest tubes.  On 4/26 repeat CT of the chest was obtained to assess pneumothoraces with recurrent subcutaneous  emphysema (after it had resolved) and it showed a tension pneumomediastinum.  Chest tube suction was increased to -40 cm with improvement in pneumomediastinum and subcutaneous air.  He had a relative adrenal insufficiency requiring vasopressors and stress dose steroids with improvement. Ultimately, a trach & PEG were placed to allow for rehab efforts.  Right chest tube was removed on 5/13.  He is tolerating brief periods of weaning (12/5 PSV) but deconditioning has been barrier to weaning. He has made significant improvement overall.  The patient was medically cleared for discharge 5/18 to [facility] when bed available.     [place] EVENTS  3/29 Admission to ICU 4/07 Remains on 100%, 60L flow 4/08 Worsening resp failure, failed bipap, intubated. ARDS. Bilateral PTX s/p CTx2 4/19 Remains on vent 60%/PEEP 8, likely to need trach  4/20 trach placed 4/26 Worsening SQ air, CT with tension pneumomediastinum, chest tube suction increased 4/29 On neo 66mg's 4/30 Weaning neo 5/04 weaning phenylephrine profoundly weak 5/05 add solucortef. Dc sxn to 20 bilateral desaturated, suction increased, but not clear that pneumo was actually worse 5/06 Suction once again reduced.  Stress dose steroids started, phenylephrine discontinued 5/07 chest tubes changed to waterseal, able to titrate midodrine down 5/10 Worked with PT. Pulled NGT out overnight.  5/11 Improved down to 40%. Says patient has little kids and is fighter and wants full code.  CT chest with improved pneumomediastinum.  Significantly fibrotic lung. Resolved right pneumothorax.  Small left pneumothorax remains.  Still on size 6 tracheostomy.  Plan replace with glide scope 5/12 Deconditioned, 40% FiO2, bilateral chest tubes 5/13 R Chest tube removed. S/p PEG. Slowly better stronger 5/17 Trach changed to Shiley #6 XLT    SIGNIFICANT DIAGNOSTIC STUDIES  CTA chest 3/29 > no PE, diffuse GGO upper lobes, patchy consolidation  in bases, peripheral fibrotic changes noted on prior CT chest still present  ECHO 3/30 >LVEF 60 to 693% grade 1 diastolic dysfunction. Mildly dilated LA, normal RV. Trivial MR and TR,otherwise normal valves  CTIntraperitoneal air, likely extension of the pneumomediastinum/pneumothorax. An intra-abdominal etiology such as bowel perforation is favored less likely.Extensive pneumomediastinum and partially visualized right pneumothorax. Diarrheal state. Correlation with clinical exam and stool cultures recommended. No bowel obstruction. Normal appendix. Sigmoid diverticulosis  CT head 4/22 > NAICP, sub cutaneous air in neck, bilateral mastoid effusions CT Chest w/o 4/26 >> tension pneumomediastinum, bilateral chest tubes in place, continued widespread pulmonary opacity with development of some bronchiectasis  CT Chest 5/11 >> improved pneumomediastinum.  Significantly fibrotic lung.  Resolved right pneumothorax. Small left pneumothorax remains.   MICRO DATA  UC 3/29 >>negative  BCx2 3/29 >>negative  PJP 4/2 >>negative  Fungitel 3/29 >> positive Blastomyces Antigen 3/20 >> negative Respiratory Culture 4/4 >> staph epidermis >> S-vanco, clinda BAL 4/8 >>candida glabrata Fungal (BAL) 4/8 >> Candida glabrata PJP (BAL) 4/8 >>negative  Aspergillus antigen (BAL) 4/8 >> Negative Tracheal aspirate 4/29 >> negative  ANTIBIOTICS Cefepime 3/29 >>3/31 Vanc 3/29 >>3/30 Azithromycin 3/29 >> 4/1 Fluconazole 3/29 >> 4/13 Meropenem 3/31 >> 4/6 Bactrim 3/31 >> 4/4 Clindamycin  4/5 >>4/20 Primaquine 4/5 >>4/20 Anidulafungin 4/12 >> 4/20 Bactrim (PCP prophylaxis)  xxxxxxxxxxx Vanc 5/13 x 1 dose for peg   CONSULTS PCCM Oncology Interventional radiology Palliative care Infectious disease Wound ostomy continence nurse Speech therapy Transition of care team  TUBES / LINES ETT 4/8 >> 4/20 Trach 4/20 >> Bilateral chest tubes 4/9 >> PEG 5/13 >>   Discharge Exam: General:  chronically ill appearing adult male lying in bed in no acute distress Neuro: Awakens to voice, alert, interactive, generalized weakness  CV: s1s2 rrr, no m/r/g, SR on monitor, left chest port c/d/i PULM:non-labored on vent, lungs bilaterally clear anterior, diminished bases GI: abdominal binder in place, PEG tube intact Extremities: warm/dry, no rashes or lesions on exposed skin   Vitals:   08/27/19 0440 08/27/19 0800 08/27/19 1148 08/27/19 1200  BP:  116/85 113/77 133/72  Pulse:  85 (!) 101 85  Resp:  (!) 25 (!) 29 (!) 27  Temp:  98.2 F (36.8 C)  (!) 97.4 F (36.3 C)  TempSrc:  Oral  Oral  SpO2:  97% 97% 95%  Weight: 66.3 kg     Height:         Discharge Labs  BMET Recent Labs  Lab 08/21/19 0424 08/21/19 1147 08/22/19 0431 08/22/19 0431 08/23/19 0425 08/23/19 0425 08/24/19 0255 08/24/19 0948 08/24/19 0948 08/26/19 0428 08/27/19 0509  NA  --    < > 136  --  138  --   --  136  --  141 140  K  --    < > 3.2*   < > 3.9   < >  --  3.4*   < > 3.9 3.3*  CL  --    < > 95*  --  99  --   --  96*  --  98 95*  CO2  --    < > 35*  --  33*  --   --  34*  --  39* 37*  GLUCOSE  --    < > 168*  --  156*  --   --  118*  --  140* 100*  BUN  --    < > 18  --  13  --   --  27*  --  30* 22  CREATININE  --    < > 0.35*  --  0.33*  --   --  0.33*  --  <0.30* <0.30*  CALCIUM  --    < > 8.4*  --  8.5*  --   --  8.6*  --  8.6* 8.8*  MG 2.1  --  1.9  --  2.0  --  1.7  --   --   --  1.7  PHOS 3.8  --  2.3*  --  3.1  --  3.5  --   --   --   --    < > = values in this interval not displayed.    CBC Recent Labs  Lab 08/22/19 0431 08/26/19 0428 08/27/19 0509  HGB 9.1* 9.0* 9.3*  HCT 29.6* 30.2* 30.6*  WBC 6.6 8.0 8.3  PLT 172 191 173    Anti-Coagulation Recent Labs  Lab 08/22/19 0431  INR 1.1    Discharge Instructions    Call MD for:  difficulty breathing, headache or visual disturbances   Complete by: As directed    Call MD for:  extreme fatigue   Complete by: As  directed    Call MD for:  hives   Complete by: As directed    Call MD for:  persistant dizziness or light-headedness   Complete by: As directed    Call MD for:  persistant nausea and vomiting   Complete by: As directed    Call MD for:  redness, tenderness, or signs of infection (pain, swelling, redness, odor or green/yellow discharge around incision site)   Complete by: As directed    Call MD for:  severe uncontrolled pain   Complete by: As directed    Call MD for:  temperature >100.4   Complete by: As directed    Discharge instructions   Complete by: As directed    1. Review medications carefully as they have changed 2. Follow up with [facility] 3. Follow up with Oncology post discharge  4. Continue all lines in place for transfer   Increase activity slowly   Complete by: As directed        [facility] Follow up.   Contact information: [address] [place] [phone]       Saguier, Percell Miller, PA-C Follow up.   Specialties: Internal Medicine, Family Medicine Why: Call to make an appointment post discharge from [place] information: [address] [place] [address] [phone]            Allergies as of 08/27/2019   No Known Allergies     Medication List    STOP taking these medications   dexamethasone 4 MG tablet Commonly known as: DECADRON   ferrous sulfate 325 (65 FE) MG tablet Replaced by: ferrous sulfate 300 (60 Fe) MG/5ML syrup   guaiFENesin-dextromethorphan 100-10 MG/5ML syrup Commonly known as: ROBITUSSIN DM   LORazepam 0.5 MG tablet Commonly known as: ATIVAN Replaced by: LORazepam 2 MG/ML injection   metFORMIN 500 MG tablet Commonly known as: GLUCOPHAGE   multivitamin with minerals Tabs tablet   naproxen sodium 220 MG tablet Commonly known as: ALEVE   ondansetron 8 MG tablet Commonly known as:  ZOFRAN Replaced by: ondansetron 4 MG/2ML Soln injection   prochlorperazine 10 MG tablet Commonly known as: COMPAZINE   sildenafil 100 MG tablet Commonly known as: Viagra   sodium chloride 0.65 % Soln nasal spray Commonly known as: OCEAN     TAKE these medications   acetaminophen 160 MG/5ML solution Commonly known as: TYLENOL Place 20.3 mLs (650 mg total) into feeding tube every 4 (four) hours as needed for mild pain or fever (temp > 101.5).   atorvastatin 10 MG tablet Commonly known as: LIPITOR Place 1 tablet (10 mg total) into feeding tube daily. Start taking on: Aug 28, 2019 What changed: how to take this   chlorhexidine gluconate (MEDLINE KIT) 0.12 % solution Commonly known as: PERIDEX 15 mLs by Mouth Rinse route 2 (two) times daily.   Chlorhexidine Gluconate Cloth 2 % Pads Apply 6 each topically daily. Start taking on: Aug 28, 2019   docusate 50 MG/5ML liquid Commonly known as: COLACE Place 10 mLs (100 mg total) into feeding tube daily as needed for mild constipation.   enoxaparin 40 MG/0.4ML injection Commonly known as: LOVENOX Inject 0.4 mLs (40 mg total) into the skin daily.   feeding supplement (PRO-STAT SUGAR FREE 64) Liqd Place 30 mLs into feeding tube 2 (two) times daily.   feeding supplement (VITAL AF 1.2 CAL) Liqd Place 1,000 mLs into feeding tube continuous.   nutrition supplement (JUVEN) Pack  Place 1 packet into feeding tube 2 (two) times daily between meals.   ferrous sulfate 300 (60 Fe) MG/5ML syrup Place 5 mLs (300 mg total) into feeding tube daily with breakfast. Start taking on: Aug 28, 2019 Replaces: ferrous sulfate 325 (65 FE) MG tablet   fludrocortisone 0.1 MG tablet Commonly known as: FLORINEF Place 1 tablet (0.1 mg total) into feeding tube daily. Start taking on: Aug 28, 2019   Gerhardt's butt cream Crea Apply 1 application topically as needed for irritation.   insulin aspart 100 UNIT/ML injection Commonly known as:  novoLOG Inject 3-9 Units into the skin every 4 (four) hours.   insulin glargine 100 UNIT/ML injection Commonly known as: LANTUS Inject 0.06 mLs (6 Units total) into the skin daily. Start taking on: Aug 28, 2019   ipratropium-albuterol 0.5-2.5 (3) MG/3ML Soln Commonly known as: DUONEB Take 3 mLs by nebulization every 4 (four) hours as needed.   lidocaine-prilocaine cream Commonly known as: EMLA Apply to affected area once What changed:   how much to take  how to take this  when to take this  reasons to take this  additional instructions   lip balm ointment Apply topically as needed for lip care.   loperamide HCl 1 MG/7.5ML suspension Commonly known as: IMODIUM Place 15 mLs (2 mg total) into feeding tube every 8 (eight) hours as needed for diarrhea or loose stools.   LORazepam 2 MG/ML injection Commonly known as: ATIVAN Inject 0.5-1 mLs (1-2 mg total) into the vein every 4 (four) hours as needed for anxiety. Replaces: LORazepam 0.5 MG tablet   midodrine 5 MG tablet Commonly known as: PROAMATINE Place 1 tablet (5 mg total) into feeding tube with breakfast, with lunch, and with evening meal.   multivitamin Liqd Place 15 mLs into feeding tube daily. Start taking on: Aug 28, 2019   ondansetron 4 MG/2ML Soln injection Commonly known as: ZOFRAN Inject 2 mLs (4 mg total) into the vein every 6 (six) hours as needed for nausea. Replaces: ondansetron 8 MG tablet   oxyCODONE 5 MG/5ML solution Commonly known as: ROXICODONE Place 5 mLs (5 mg total) into feeding tube every 8 (eight) hours.   pantoprazole sodium 40 mg/20 mL Pack Commonly known as: PROTONIX Place 20 mLs (40 mg total) into feeding tube daily. Start taking on: Aug 28, 2019   sertraline 50 MG tablet Commonly known as: ZOLOFT Place 1 tablet (50 mg total) into feeding tube daily. Start taking on: Aug 28, 2019   sodium chloride 0.9 % infusion Inject 250 mLs into the vein continuous.    sulfamethoxazole-trimethoprim 200-40 MG/5ML suspension Commonly known as: BACTRIM Place 20 mLs into feeding tube daily. Start taking on: Aug 28, 2019         Disposition: LTAC when bed available  Discharged Condition: LANI MENDIOLA has met maximum benefit of inpatient care and is medically stable and cleared for discharge.  Patient is pending follow up as above.      Time spent on disposition: 40 minutes.   Signed: Noe Gens, NP-C [facility] Pgr: [phone] Office: [phone]

## 2019-08-26 NOTE — TOC Progression Note (Signed)
Transition of Care [facility]) - Progression Note    Patient Details  Name: TYJON BOWEN MRN: [id number] Date of Birth: 02-15-1956  Transition of Care [facility]) CM/SW Contact  Leeroy Cha, RN Phone Number: 08/26/2019, 9:48 AM  Clinical Narrative:    Text from [place], [employer] cross blue [employer] has approved for ltach stay.  Searching bed status now.     Expected Discharge Plan: Long Term Acute Care (LTAC) Barriers to Discharge: Continued Medical Work up  Expected Discharge Plan and Services Expected Discharge Plan: [place] (LTAC)       Living arrangements for the past 2 months: Single Family Home                                       Social Determinants of Health (SDOH) Interventions    Readmission Risk Interventions Readmission Risk Prevention Plan 07/22/2019  Transportation Screening Complete  Medication Review [employer]) Complete  Some recent data might be hidden

## 2019-08-26 NOTE — Progress Notes (Signed)
NAME:  Steve [name], MRN:  [id number], DOB:  February 20, 1956, LOS: 59 ADMISSION DATE:  07/08/2019, CONSULTATION DATE:  3/29 REFERRING MD:  [name]  CHIEF COMPLAINT:  Dyspnea   Brief History   64 y/o male, with hx COVID infection 05/2019, admitted with severe acute respiratory failure with hypoxemia in the setting of receiving chemotherapy for Hodgkin's lymphoma. ICU course prolonged due to ARDS, malnutrition.    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 ARDS. Bilateral PTX s/p CTx2 4/19 Remains on vent 60%/PEEP 8, likely to need trach  4/20 trach placed 4/26 Worsening SQ air, CT with tension pneumomediastinum, chest tube suction increased 4/29 On neo 23mg's 4/30 Weaning neo 5/4 weaning phenylephrine profoundly weak 5/5 add solucortef. Dc sxn to 20 bilateral desaturated, suction increased, but not clear that pneumo was actually worse 5/6: Suction once again reduced.  Stress dose steroids started, phenylephrine discontinued 5/7 chest tubes changed to waterseal, able to titrate midodrine down  5/10 -Slightly worked up after working with PT.  SpO2 in low 90s on 50%.  Has intermittent tachypnea into high 20's and low 30's.  But he nods his head "no" when asked if he is in any pain or distress. Pulled out NG tube overnight.  5/11 - improved down to 40%. IR panda pending. Sister at bedside- interested in [place] Says patient has little kids and is fighter and wants full code. Per sister - he is more awake and interactive and significantly better than a week ago.  Ct chest with improved pneumomediastinum.  Significantly fibrotic lung.  Resolved right pneumothorax.  Small left pneumothorax remains.  Still on size  6 tracheostomy.  Plan replaced with glide scope\  5/12 -remains on 40% oxygen not much of tracheostomy leak.  Bilateral chest tube persist.  Good upper extremity movement but weak on the lower extremities although able to move with a power of 1-2/5.  No real change.  Continues to be deconditioned.  Has Panda.  Order for PEG tube placed but pending.   5/13 - R Chest tube removed. And s/p PEG /. Slolwly better stronger  Consults:  ID >> s/o 4/09 Oncology >> s/o 4/05  Procedures:  4/8 ETT > 4/20 4/20 Trach >> 4/9 bilateral chest tubes 5/13 PEG  Significant Diagnostic Tests:   CTA chest 3/29 > no PE, diffuse GGO upper lobes, patchy consolidation in bases, peripheral fibrotic changes noted on prior CT chest still present  ECHO 3/30 >LVEF 60 to 608% grade 1 diastolic dysfunction. Mildly dilated LA, normal RV. Trivial MR and TR,otherwise normal valves  CT Intraperitoneal air, likely extension of the pneumomediastinum/pneumothorax. An intra-abdominal etiology such as bowel perforation is favored less likely.Extensive pneumomediastinum and partially visualized right pneumothorax. Diarrheal state. Correlation with clinical exam and stool cultures recommended. No bowel obstruction. Normal appendix. Sigmoid diverticulosis  CT head 4/22 > NAICP, sub cutaneous air in neck, bilateral mastoid effusions CT Chest w/o 4/26 >> tension pneumomediastinum, bilateral chest tubes in place, continued widespread pulmonary opacity with development of some bronchiectasis  CT Chest 5/11 >> Ct chest with improved pneumomediastinum.  Significantly fibrotic lung.  Resolved right pneumothorax.  Small left pneumothorax remains.   Micro Data:  UC 3/29 >>negative  BCx2 3/29 >>negative  PJP 4/2 >>negative  Fungitel 3/29 >> positive Blastomyces Antigen 3/20 >> negative Respiratory Culture 4/4 >> staph epidermis >> S-vanco, clinda BAL 4/8 >>candida glabrata Fungal (BAL) 4/8 >> Candida glabrata PJP (BAL) 4/8  >>negative  Aspergillus antigen (BAL) 4/8 >> Negative Tracheal aspirate 4/29 >> negative  Antimicrobials:  Cefepime 3/29 >>3/31 Vanc 3/29 >>3/30 Azithromycin 3/29 >> 4/1 Fluconazole 3/29 >> 4/13 Meropenem 3/31 >> 4/6 Bactrim 3/31 >> 4/4 Clindamycin 4/5 >>4/20 Primaquine 4/5 >>4/20 Anidulafungin 4/12 >> 4/20 Bactrim (PCP prophylaxis)  xxxxxxxxxxx vanc 5/13 x 1 dose for peg   Interim history/subjective:  On vent, denies pain / SOB Care Manager reports approval for LTAC per insurance, no bed offer as of yet Afebrile  Tolerating TF via PEG   Objective   Blood pressure 122/78, pulse 94, temperature 98.2 F (36.8 C), temperature source Oral, resp. rate (!) 34, height _0  (1.676 m), weight 66.9 kg, SpO2 99 %.    Vent Mode: PRVC FiO2 (%):  [45 %] 45 % Set Rate:  [20 bmp] 20 bmp Vt Set:  [510 mL] 510 mL PEEP:  [5 cmH20] 5 cmH20 Pressure Support:  [10 cmH20] 10 cmH20 Plateau Pressure:  [25 cmH20-33 cmH20] 33 cmH20   Intake/Output Summary (Last 24 hours) at 08/26/2019 1318 Last data filed at 08/26/2019 0900 Gross per 24 hour  Intake 325.36 ml  Output 1400 ml  Net -1074.64 ml   Filed Weights   08/24/19 0500 08/25/19 0453 08/26/19 0500  Weight: 66.5 kg 68.4 kg 66.9 kg     Examination  General: chronically ill appearing adult male lying in bed in NAD on vent HEENT: MM pink/moist, trach midline c/d/i, temporal wasting, anicteric  Neuro: awakens to voice, interactive, attempts to mouth words, generalized deconditioning, upper extremities stronger than BLE CV: s1s2 RRR, no m/r/g PULM: tachypneic but in no distress, synchronous with vent, lungs bilaterally clear anterior, diminisehd bases, left chest tube intact, minimal bloody drainage from tube GI: soft, bsx4 active , PEG intact, abdominal binder in place Extremities: warm/dry, muscle wasting, no edema  Skin: no rashes or lesions  Resolved Hospital Problem list   Candida glabrata in BAL PJP pneumonia, suspected,  treated for 21 days Hypernatremia  Acute metabolic encephalopathy , resolved. - Fentanyl tachyphylaxis.  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  Profound deconditioning and severe malnutrition status post prolonged critical illness seem to be the primary barrier to ventilator liberation with very poor mechanics/ high wob c/w FP ARDS.   Acute on chronic Respiratory Failure due to ARDS, now trach and vent dependent.  Fibroproliferative changes from ARDS.  Previously significant barotrauma with bilateral pneumothoraces -continue PRVC 8cc/kg  -wean O2 for sats >88% -trach care per protocol  -continue left chest tube to -20 cm suction  -follow intermittent CXR -daily SBT attempt as tolerated  -LTAC when bed available  -mobilize  Sedation / Pain needs on Ventilator -continue pain control with oxycodone, PRN morphine, ativan -continue zoloft   Opportunistic infection pneumonia - this admit (suspected PJP and cofnirmed candida glabrada) -continue prophylactic bactrim   Hypotension; seemingly responding to treatment of relative adrenal insufficiency.  Etiology likely multifactorial, medication related, relative adrenal insufficiency from prolonged critical illness.  Blood pressure remarkably improved after adding stress dose steroids remains off pressors -continue hydrocortisone, last weaned 5/14, consider stopping 5/17 -continue PT midodrine, fludrocortisone  Fluid and electrolyte imbalance: hypokalemia -monitor, replace as indicated  Anemia of chronic illness Mild Thrombocytopenia Hodgkin lymphoma-partially treated Hx of hemoglobin <7 with hemodynamically  significant bleeding.  Concern his partially treated lymphoma will become a life limiting illness eventually. -continue iron supplementation  -follow CBC, transfuse for Hgb <7%  Hyperlipidemia. -continue lipitor  Severe protein calorie malnutrition. Dysphagia  Pulled out NGT  multiple times with difficulty with replacement, s/p peg 5/13/211 -TF per Nutrition -anticipate he can advance to goal as tolerated   Physical deconditioning. -PT efforts -will need LTAC for rehab efforts  -push PT / OT efforts   Sacral Decubitus ulcers. -wound care per WOC -pressure relief measures  Goals of care. -per multiple prior discussions, full code    Best practice:  Diet: tube feeding   Pain/Anxiety/Delirium protocol (if indicated):   VAP protocol (if indicated): yes DVT prophylaxis: lovenox GI prophylaxis: PPI Glucose control: SSI Mobility: bed rest Code Status: full Family Communication: [name] updated on plan of care 5/17 Disposition: ICU.    CC Time: 27 minutes  Noe Gens, MSN, NP-C [facility] 08/26/2019, 1:31 PM   Please see [email or web address] for pager details.

## 2019-08-27 ENCOUNTER — Inpatient Hospital Stay (HOSPITAL_COMMUNITY): Payer: BC Managed Care – PPO

## 2019-08-27 LAB — COMPREHENSIVE METABOLIC PANEL
ALT: 18 U/L (ref 0–44)
AST: 16 U/L (ref 15–41)
Albumin: 2.4 g/dL — ABNORMAL LOW (ref 3.5–5.0)
Alkaline Phosphatase: 57 U/L (ref 38–126)
Anion gap: 8 (ref 5–15)
BUN: 22 mg/dL (ref 8–23)
CO2: 37 mmol/L — ABNORMAL HIGH (ref 22–32)
Calcium: 8.8 mg/dL — ABNORMAL LOW (ref 8.9–10.3)
Chloride: 95 mmol/L — ABNORMAL LOW (ref 98–111)
Creatinine, Ser: <0.3 mg/dL — ABNORMAL LOW (ref 0.61–1.24)
Glucose, Bld: 100 mg/dL — ABNORMAL HIGH (ref 70–99)
Potassium: 3.3 mmol/L — ABNORMAL LOW (ref 3.5–5.1)
Sodium: 140 mmol/L (ref 135–145)
Total Bilirubin: 0.5 mg/dL (ref 0.3–1.2)
Total Protein: 6.1 g/dL — ABNORMAL LOW (ref 6.5–8.1)

## 2019-08-27 LAB — CBC WITH DIFFERENTIAL/PLATELET
Abs Immature Granulocytes: 0.02 10*3/uL (ref 0.00–0.07)
Basophils Absolute: 0 10*3/uL (ref 0.0–0.1)
Basophils Relative: 0 %
Eosinophils Absolute: 0.2 10*3/uL (ref 0.0–0.5)
Eosinophils Relative: 2 %
HCT: 30.6 % — ABNORMAL LOW (ref 39.0–52.0)
Hemoglobin: 9.3 g/dL — ABNORMAL LOW (ref 13.0–17.0)
Immature Granulocytes: 0 %
Lymphocytes Relative: 29 %
Lymphs Abs: 2.4 10*3/uL (ref 0.7–4.0)
MCH: 30.6 pg (ref 26.0–34.0)
MCHC: 30.4 g/dL (ref 30.0–36.0)
MCV: 100.7 fL — ABNORMAL HIGH (ref 80.0–100.0)
Monocytes Absolute: 0.9 10*3/uL (ref 0.1–1.0)
Monocytes Relative: 11 %
Neutro Abs: 4.8 10*3/uL (ref 1.7–7.7)
Neutrophils Relative %: 58 %
Platelets: 173 10*3/uL (ref 150–400)
RBC: 3.04 MIL/uL — ABNORMAL LOW (ref 4.22–5.81)
RDW: 15.6 % — ABNORMAL HIGH (ref 11.5–15.5)
WBC: 8.3 10*3/uL (ref 4.0–10.5)
nRBC: 0 % (ref 0.0–0.2)

## 2019-08-27 LAB — GLUCOSE, CAPILLARY
Glucose-Capillary: 126 mg/dL — ABNORMAL HIGH (ref 70–99)
Glucose-Capillary: 125 mg/dL — ABNORMAL HIGH (ref 70–99)
Glucose-Capillary: 102 mg/dL — ABNORMAL HIGH (ref 70–99)
Glucose-Capillary: 196 mg/dL — ABNORMAL HIGH (ref 70–99)
Glucose-Capillary: 157 mg/dL — ABNORMAL HIGH (ref 70–99)
Glucose-Capillary: 103 mg/dL — ABNORMAL HIGH (ref 70–99)

## 2019-08-27 LAB — MAGNESIUM: Magnesium: 1.7 mg/dL (ref 1.7–2.4)

## 2019-08-27 MED ORDER — VITAL AF 1.2 CAL PO LIQD: 1000.0000 mL | ORAL | Status: DC

## 2019-08-27 MED ORDER — OXYCODONE HCL 5 MG/5ML PO SOLN: 5.0000 mg | Freq: Three times a day (TID) | ORAL | 0 refills | Status: DC

## 2019-08-27 MED ORDER — JUVEN PO PACK: 1.0000 | PACK | Freq: Two times a day (BID) | ORAL | 0 refills | Status: DC

## 2019-08-27 MED ORDER — VITAL AF 1.2 CAL PO LIQD
1000.0000 mL | ORAL | Status: DC
  Administered 2019-08-27 – 2019-08-28 (×2): 1000 mL

## 2019-08-27 MED ORDER — FERROUS SULFATE 300 (60 FE) MG/5ML PO SYRP: 300.0000 mg | ORAL_SOLUTION | Freq: Every day | ORAL | 3 refills | Status: DC

## 2019-08-27 MED ORDER — GERHARDT'S BUTT CREAM: 1.0000 "application " | TOPICAL_CREAM | CUTANEOUS | Status: DC | PRN

## 2019-08-27 MED ORDER — MIDODRINE HCL 5 MG PO TABS: 5.0000 mg | ORAL_TABLET | Freq: Three times a day (TID) | ORAL | Status: DC

## 2019-08-27 MED ORDER — FLUDROCORTISONE ACETATE 0.1 MG PO TABS: 0.1000 mg | ORAL_TABLET | Freq: Every day | ORAL | Status: DC

## 2019-08-27 MED ORDER — LOPERAMIDE HCL 1 MG/7.5ML PO SUSP: 2.0000 mg | Freq: Three times a day (TID) | ORAL | 0 refills | Status: DC | PRN

## 2019-08-27 MED ORDER — ENOXAPARIN SODIUM 40 MG/0.4ML ~~LOC~~ SOLN: 40.0000 mg | SUBCUTANEOUS | Status: DC

## 2019-08-27 MED ORDER — ONDANSETRON HCL 4 MG/2ML IJ SOLN: 4.0000 mg | Freq: Four times a day (QID) | INTRAMUSCULAR | 0 refills | Status: DC | PRN

## 2019-08-27 MED ORDER — DOCUSATE SODIUM 50 MG/5ML PO LIQD: 100.0000 mg | Freq: Every day | ORAL | 0 refills | Status: DC | PRN

## 2019-08-27 MED ORDER — CHLORHEXIDINE GLUCONATE CLOTH 2 % EX PADS: 6.0000 | MEDICATED_PAD | Freq: Every day | CUTANEOUS | Status: DC

## 2019-08-27 MED ORDER — SERTRALINE HCL 50 MG PO TABS: 50.0000 mg | ORAL_TABLET | Freq: Every day | ORAL | Status: DC

## 2019-08-27 MED ORDER — LIP MEDEX EX OINT: TOPICAL_OINTMENT | CUTANEOUS | 0 refills | Status: DC | PRN

## 2019-08-27 MED ORDER — PRO-STAT SUGAR FREE PO LIQD: 30.0000 mL | Freq: Two times a day (BID) | ORAL | 0 refills | Status: DC

## 2019-08-27 MED ORDER — PANTOPRAZOLE SODIUM 40 MG PO PACK: 40.0000 mg | PACK | ORAL | Status: DC

## 2019-08-27 MED ORDER — SULFAMETHOXAZOLE-TRIMETHOPRIM 200-40 MG/5ML PO SUSP: 20.0000 mL | Freq: Every day | ORAL | 0 refills | Status: DC

## 2019-08-27 MED ORDER — INSULIN ASPART 100 UNIT/ML ~~LOC~~ SOLN: 3.0000 [IU] | SUBCUTANEOUS | 11 refills | Status: DC

## 2019-08-27 MED ORDER — POTASSIUM CHLORIDE 20 MEQ/15ML (10%) PO SOLN
40.0000 meq | Freq: Once | ORAL | Status: AC
  Administered 2019-08-27: 40 meq
  Filled 2019-08-27: qty 30

## 2019-08-27 MED ORDER — SODIUM CHLORIDE 0.9 % IV SOLN: 250.0000 mL | INTRAVENOUS | 0 refills | Status: DC

## 2019-08-27 MED ORDER — INSULIN GLARGINE 100 UNIT/ML ~~LOC~~ SOLN: 6.0000 [IU] | Freq: Every day | SUBCUTANEOUS | 11 refills | Status: DC

## 2019-08-27 MED ORDER — LORAZEPAM 2 MG/ML IJ SOLN: 1.0000 mg | INTRAMUSCULAR | 0 refills | Status: DC | PRN

## 2019-08-27 MED ORDER — ATORVASTATIN CALCIUM 10 MG PO TABS: 10.0000 mg | ORAL_TABLET | Freq: Every day | ORAL | Status: DC

## 2019-08-27 MED ORDER — CHLORHEXIDINE GLUCONATE 0.12% ORAL RINSE (MEDLINE KIT): 15.0000 mL | Freq: Two times a day (BID) | OROMUCOSAL | 0 refills | Status: DC

## 2019-08-27 MED ORDER — ADULT MULTIVITAMIN LIQUID CH: 15.0000 mL | Freq: Every day | ORAL | Status: AC

## 2019-08-27 MED ORDER — MAGNESIUM SULFATE 2 GM/50ML IV SOLN
2.0000 g | Freq: Once | INTRAVENOUS | Status: AC
  Administered 2019-08-27: 2 g via INTRAVENOUS
  Filled 2019-08-27: qty 50

## 2019-08-27 MED ORDER — ACETAMINOPHEN 160 MG/5ML PO SOLN: 650.0000 mg | ORAL | 0 refills | Status: DC | PRN

## 2019-08-27 MED ORDER — IPRATROPIUM-ALBUTEROL 0.5-2.5 (3) MG/3ML IN SOLN: 3.0000 mL | RESPIRATORY_TRACT | Status: DC | PRN

## 2019-08-27 NOTE — Progress Notes (Signed)
eLink Physician-Brief Progress Note Patient Name: Steve [name] DOB: [birth year] MRN: [id number]   Date of Service  08/27/2019  HPI/Events of Note  Pt needs a.m. labs ordered.  eICU Interventions   a.m. labs ordered.        [name] [name] 08/27/2019, 5:11 AM

## 2019-08-27 NOTE — Progress Notes (Addendum)
Nutrition Follow-up  DOCUMENTATION CODES:   Non-severe (moderate) malnutrition in context of chronic illness  INTERVENTION:  - Vital AF 1.2 @ 50 ml/hr with 30 ml prostat BID and juven BID. - this regimen will provide 1830 kcal (112% estimated kcal need), 125 grams protein, and 973 ml free water.  - free water flush to be per CCM.    NUTRITION DIAGNOSIS:   Moderate Malnutrition related to chronic illness, cancer and cancer related treatments as evidenced by mild fat depletion, mild muscle depletion, percent weight loss. -ongoing  GOAL:   Patient will meet greater than or equal to 90% of their needs -will be met with TF regimen  MONITOR:   Vent status, TF tolerance, Labs, Weight trends, Skin  ASSESSMENT:   64 year-old male with a history of Hodgkin's lymphoma (stage 3, diagnosed 02/2019) on chemotherapy. He follows with Dr. Maylon Peppers in Oncology. His most recent treatment was on 3/8. He presented to the ED with worsening respiratory status and inability to tolerate PO intake recently. He had his chemo delayed this week. He has a history of neutropenic fever in the past. He had a good response to chemo on his first follow up PET scan. His appetite has been poor and he has been receiving IV fluids.  Significant Events: 3/29- admission 3/30- initial RD assessment 4/8- intubation and OGT placement 4/20- tracheostomy; OGT removed and small bore NGT placed in R nare 5/2- small bore NGT replaced in R nare 5/7- chest tubes changed to water seal 5/12- patient pulled small bore NGT 5/13- PEG placed by IR   Patient is on vent via trach, alert and oriented this AM. He is able to mouth words to communicate. He denies any abdominal pain/pressure or nausea.   He is currently receiving Vital AF 1.2 @ 40 ml/hr with 30 ml prostat BID and 1 packet juven BID via PEG. This regimen is providing 1542 kcal, 107 grams protein, and 778 ml free water.  Able to talk with RN who reports patient is tolerating  TF without issue. TF rate was decreased during night shift due to patient's stomach "rumbling."   Per rounds this AM, patient to go to [facility], as soon as later today.    Patient is currently on ventilator support via trach MV: 11.5 L/min Temp (24hrs), Avg:98.1 F (36.7 C), Min:97.7 F (36.5 C), Max:98.3 F (36.8 C) Propofol: none  Labs reviewed; CBGs: 126 and 125 mg/dl, K: 3.3 mmol/l, Cl: 95 mmol/l, creatinine: <0.3 mg/dl. Medications reviewed; 300 mg ferrous sulfate per PEG/day, sliding scale novolog, 6 units lantus/day, 2 g IV Mg sulfate x1 run 5/18, 15 ml multivitamin per PEG/day, 40 mg protonix per PEG/day,  IVF; D10 @ 10 ml/hr (82 kcal).    Diet Order:   Diet Order            Diet NPO time specified  Diet effective midnight              EDUCATION NEEDS:   No education needs have been identified at this time  Skin:  Skin Assessment: Skin Integrity Issues: Skin Integrity Issues:: Stage I, Unstageable DTI: sacrum (4/15) Stage I: R heel (4/19) Unstageable: full thickness to lip (4/15) and sacrum (4/26)  Last BM:  5/17  Height:   Ht Readings from Last 1 Encounters:  08/19/19 5' 6"  (1.676 m)    Weight:   Wt Readings from Last 1 Encounters:  08/27/19 66.3 kg    Ideal Body Weight:  64.5 kg  BMI:  Body mass index is 23.59 kg/m.  Estimated Nutritional Needs:   Kcal:  1639 kcal  Protein:  125-139 grams (1.8-2 grams/kg)  Fluid:  >/= 2 L/day     Steve Matin, MS, RD, LDN, CNSC Inpatient Clinical Dietitian RD pager # available in AMION  After hours/weekend pager # available in [facility]

## 2019-08-27 NOTE — Progress Notes (Addendum)
NAME:  Steve [name], MRN:  [id number], DOB:  11/14/1955, LOS: 56 ADMISSION DATE:  07/08/2019, CONSULTATION DATE:  3/29 REFERRING MD:  [name]  CHIEF COMPLAINT:  Dyspnea   Brief History   64 y/o male, with hx COVID infection 05/2019, admitted with severe acute respiratory failure with hypoxemia in the setting of receiving chemotherapy for Hodgkin's lymphoma. ICU course prolonged due to ARDS, malnutrition.    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 ARDS. Bilateral PTX s/p CTx2 4/19 Remains on vent 60%/PEEP 8, likely to need trach  4/20 trach placed 4/26 Worsening SQ air, CT with tension pneumomediastinum, chest tube suction increased 4/29 On neo 16mg's 4/30 Weaning neo 5/4 weaning phenylephrine profoundly weak 5/5 add solucortef. Dc sxn to 20 bilateral desaturated, suction increased, but not clear that pneumo was actually worse 5/6: Suction once again reduced.  Stress dose steroids started, phenylephrine discontinued 5/7 chest tubes changed to waterseal, able to titrate midodrine down 5/10 Worked with PT.  SpO2 in low 90s on 50%.  Has intermittent tachypnea into high 20's and low 30's.  Pulled out NG tube overnight. 5/11 Improved down to 40%. IR panda pending. Sister at bedside- interested in [place] Says patient has little kids and is fighter and wants full code. Per sister - he is more awake and interactive and significantly better than a week ago.  Ct chest with improved pneumomediastinum.  Significantly fibrotic lung.  Resolved right pneumothorax.  Small left pneumothorax remains.  Still on size 6 tracheostomy.  Plan replace with glide scope 5/12 Remains on 40% oxygen not much of tracheostomy  leak.  Bilateral chest tube persist.  Good upper extremity movement but weak on the lower extremities although able to move with a power of 1-2/5.  No real change.  Continues to be deconditioned.  Has Panda.  Order for PEG tube placed but pending. 5/13 R Chest tube removed, s/p PEG. Slowly getting stronger 5/18 Weaned on PSV for 35 minutes   Consults:  ID - s/o 4/09 Oncology - s/o 4/05  Procedures:  4/8 ETT > 4/20 4/20 Trach >> 4/9 bilateral chest tubes 5/13 PEG  Significant Diagnostic Tests:   CTA chest 3/29 > no PE, diffuse GGO upper lobes, patchy consolidation in bases, peripheral fibrotic changes noted on prior CT chest still present  ECHO 3/30 >LVEF 60 to 667% grade 1 diastolic dysfunction. Mildly dilated LA, normal RV. Trivial MR and TR,otherwise normal valves  CT Intraperitoneal air, likely extension of the pneumomediastinum/pneumothorax. An intra-abdominal etiology such as bowel perforation is favored less likely.Extensive pneumomediastinum and partially visualized right pneumothorax. Diarrheal state. Correlation with clinical exam and stool cultures recommended. No bowel obstruction. Normal appendix. Sigmoid diverticulosis  CT head 4/22 > NAICP, sub cutaneous air in neck, bilateral mastoid effusions CT Chest w/o 4/26 >> tension pneumomediastinum, bilateral chest tubes in place, continued widespread pulmonary opacity with development of some bronchiectasis  CT Chest 5/11 >> Ct chest with improved pneumomediastinum.  Significantly fibrotic lung.  Resolved right pneumothorax.  Small left pneumothorax remains.   Micro Data:  UC 3/29 >>negative  BCx2 3/29 >>negative  PJP 4/2 >>negative  Fungitel 3/29 >> positive Blastomyces Antigen 3/20 >> negative Respiratory Culture 4/4 >> staph epidermis >> S-vanco, clinda BAL 4/8 >>  candida glabrata Fungal (BAL) 4/8 >> Candida glabrata PJP (BAL) 4/8 >>negative  Aspergillus antigen (BAL) 4/8 >> Negative Tracheal aspirate 4/29 >>  negative  Antimicrobials:  Cefepime 3/29 >>3/31 Vanc 3/29 >>3/30 Azithromycin 3/29 >> 4/1 Fluconazole 3/29 >> 4/13 Meropenem 3/31 >> 4/6 Bactrim 3/31 >> 4/4 Clindamycin 4/5 >>4/20 Primaquine 4/5 >>4/20 Anidulafungin 4/12 >> 4/20 Bactrim (PCP prophylaxis)  xxxxxxxxxxx Vanc 5/13 x 1 dose for peg   Interim history/subjective:  On vent, weaned for 35 minutes this am on PSV  No acute complaints  Await bed placement at LTAC Afebrile  Objective   Blood pressure 116/85, pulse 85, temperature 98.2 F (36.8 C), temperature source Oral, resp. rate (!) 25, height _0  (1.676 m), weight 66.3 kg, SpO2 97 %.    Vent Mode: PRVC FiO2 (%):  [40 %-45 %] 40 % Set Rate:  [20 bmp] 20 bmp Vt Set:  [510 mL] 510 mL PEEP:  [5 cmH20] 5 cmH20 Plateau Pressure:  [18 cmH20-39 cmH20] 18 cmH20   Intake/Output Summary (Last 24 hours) at 08/27/2019 0901 Last data filed at 08/27/2019 0800 Gross per 24 hour  Intake 229.49 ml  Output 1320 ml  Net -1090.51 ml   Filed Weights   08/25/19 0453 08/26/19 0500 08/27/19 0440  Weight: 68.4 kg 66.9 kg 66.3 kg     Examination  General: chronically ill appearing adult male lying in bed in NAD on vent HEENT: MM pink/moist, trach midline c/d/i, temporal wasting, anicteric  Neuro: Awake, alert, interacts appropriately, mouths words, generalized weakness CV: s1s2 rrr, no m/r/g PULM: mildly labored on PSV, lungs bilaterally coarse GI: soft, bsx4 active, PEG intact, abdominal binder in place Extremities: warm/dry, no edema, muslcle wasting  Skin: no rashes or lesions  Resolved Hospital Problem list   Candida glabrata in BAL PJP pneumonia, suspected, treated for 21 days Hypernatremia  Acute metabolic encephalopathy , resolved. - Fentanyl tachyphylaxis.  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  Profound deconditioning and severe malnutrition status post prolonged critical illness seem to be the primary  barrier to ventilator liberation with very poor mechanics/ high wob c/w FP ARDS.  Acute on chronic Respiratory Failure due to ARDS, now trach and vent dependent.  Fibroproliferative changes from ARDS.  Previously significant barotrauma with bilateral pneumothoraces -continue PRVC 8cc/kg  -wean O2 for sats >88% -push PT efforts / mobilize -continue left chest tube to -20 cm suction  -daily SBT with repeat throughout day. Will need slow wean  -follow intermittent CXR  Sedation / Pain needs on Ventilator -pain control with oxycodone, PRN morphine, PRN ativan for anxiety   -continue zoloft   Opportunistic infection pneumonia - this admit (suspected PJP and cofnirmed candida glabrada) -bactrim prophylaxis    Hypotension; seemingly responding to treatment of relative adrenal insufficiency.  Etiology likely multifactorial, medication related, relative adrenal insufficiency from prolonged critical illness.  Blood pressure remarkably improved after adding stress dose steroids remains off pressors -continue PT midodrine, fludrocortisone -stop stress dose steroids, if recurrent hypotension, would restart   Fluid and electrolyte imbalance: hypokalemia, hypomagnesemia  -monitor, replace K, Mg 5/18  -follow up lytes in am   Anemia of chronic illness Mild Thrombocytopenia Hodgkin lymphoma-partially treated Hx of hemoglobin <7 with hemodynamically significant bleeding.  Concern his partially treated lymphoma will become a life limiting illness eventually. -trend CBC -transfuse for Hgb <7% or active bleeding  -continue iron supplementation   Hyperlipidemia. -lipitor  Severe protein calorie malnutrition. Dysphagia  Pulled out NGT  multiple times with difficulty with replacement, s/p peg 5/13/211 -TF per nutrition  -advance TF to goal as tolerated  -discontinue D10 infusion if tolerating TF  Physical deconditioning. -PT / OT -will need LTAC for rehab efforts   Sacral Decubitus ulcers.  -WOC following, appreciate input  -pressure relief measures  Goals of care. -full code, multiple prior discussions    Best practice:  Diet: tube feeding   Pain/Anxiety/Delirium protocol (if indicated):   VAP protocol (if indicated): yes DVT prophylaxis: lovenox GI prophylaxis: PPI Glucose control: SSI Mobility: bed rest Code Status: full Family Communication: Will update wife on arrival 5/18.  She is aware we are waiting on bed availability for transfer to LTAC Disposition: ICU.    CC Time: 49 minutes  Noe Gens, MSN, NP-C [facility] 08/27/2019, 9:01 AM   Please see [email or web address] for pager details.

## 2019-08-27 NOTE — Progress Notes (Signed)
Patient returned to full support at this time. Patient feels tired on wean of 12/+5. PCCM NP at bedside as well; bedside RN made aware. Per PCCM NP, attempt at later time. RT will continue to monitor patient.

## 2019-08-28 ENCOUNTER — Other Ambulatory Visit (HOSPITAL_COMMUNITY): Payer: BC Managed Care – PPO

## 2019-08-28 ENCOUNTER — Inpatient Hospital Stay (HOSPITAL_COMMUNITY): Payer: BC Managed Care – PPO

## 2019-08-28 ENCOUNTER — Inpatient Hospital Stay
Admission: RE | Admit: 2019-08-28 | Discharge: 2019-10-30 | Disposition: A | Discharge status: Hospice | Payer: BC Managed Care – PPO | Source: Ambulatory Visit | Attending: Internal Medicine | Admitting: Internal Medicine

## 2019-08-28 DIAGNOSIS — J9383 Other pneumothorax: Secondary | ICD-10-CM | POA: Diagnosis present

## 2019-08-28 DIAGNOSIS — Z931 Gastrostomy status: Secondary | ICD-10-CM

## 2019-08-28 DIAGNOSIS — G839 Paralytic syndrome, unspecified: Secondary | ICD-10-CM

## 2019-08-28 DIAGNOSIS — Z9911 Dependence on respirator [ventilator] status: Secondary | ICD-10-CM

## 2019-08-28 DIAGNOSIS — Z9689 Presence of other specified functional implants: Secondary | ICD-10-CM

## 2019-08-28 DIAGNOSIS — J9611 Chronic respiratory failure with hypoxia: Secondary | ICD-10-CM | POA: Diagnosis present

## 2019-08-28 DIAGNOSIS — R0902 Hypoxemia: Secondary | ICD-10-CM

## 2019-08-28 DIAGNOSIS — R112 Nausea with vomiting, unspecified: Secondary | ICD-10-CM

## 2019-08-28 DIAGNOSIS — U071 COVID-19: Secondary | ICD-10-CM | POA: Diagnosis present

## 2019-08-28 DIAGNOSIS — C8190 Hodgkin lymphoma, unspecified, unspecified site: Secondary | ICD-10-CM | POA: Diagnosis present

## 2019-08-28 DIAGNOSIS — Z992 Dependence on renal dialysis: Secondary | ICD-10-CM

## 2019-08-28 DIAGNOSIS — J189 Pneumonia, unspecified organism: Secondary | ICD-10-CM | POA: Diagnosis present

## 2019-08-28 DIAGNOSIS — J80 Acute respiratory distress syndrome: Secondary | ICD-10-CM | POA: Diagnosis present

## 2019-08-28 DIAGNOSIS — Z4682 Encounter for fitting and adjustment of non-vascular catheter: Secondary | ICD-10-CM

## 2019-08-28 DIAGNOSIS — J9690 Respiratory failure, unspecified, unspecified whether with hypoxia or hypercapnia: Secondary | ICD-10-CM

## 2019-08-28 HISTORY — DX: Acute and chronic respiratory failure with hypoxia: J96.21

## 2019-08-28 HISTORY — DX: Acute respiratory distress syndrome: J80

## 2019-08-28 HISTORY — DX: COVID-19: U07.1

## 2019-08-28 HISTORY — DX: Other pneumothorax: J93.83

## 2019-08-28 HISTORY — DX: Pneumonia, unspecified organism: J18.9

## 2019-08-28 HISTORY — DX: Hodgkin lymphoma, unspecified, unspecified site: C81.90

## 2019-08-28 LAB — CBC
HCT: 30.4 % — ABNORMAL LOW (ref 39.0–52.0)
Hemoglobin: 9.1 g/dL — ABNORMAL LOW (ref 13.0–17.0)
MCH: 29.8 pg (ref 26.0–34.0)
MCHC: 29.9 g/dL — ABNORMAL LOW (ref 30.0–36.0)
MCV: 99.7 fL (ref 80.0–100.0)
Platelets: 208 10*3/uL (ref 150–400)
RBC: 3.05 MIL/uL — ABNORMAL LOW (ref 4.22–5.81)
RDW: 15.7 % — ABNORMAL HIGH (ref 11.5–15.5)
WBC: 11.4 10*3/uL — ABNORMAL HIGH (ref 4.0–10.5)
nRBC: 0 % (ref 0.0–0.2)

## 2019-08-28 LAB — GLUCOSE, CAPILLARY
Glucose-Capillary: 150 mg/dL — ABNORMAL HIGH (ref 70–99)
Glucose-Capillary: 168 mg/dL — ABNORMAL HIGH (ref 70–99)
Glucose-Capillary: 40 mg/dL — CL (ref 70–99)
Glucose-Capillary: 75 mg/dL (ref 70–99)
Glucose-Capillary: 101 mg/dL — ABNORMAL HIGH (ref 70–99)

## 2019-08-28 LAB — BLOOD GAS, ARTERIAL
Acid-Base Excess: 14.6 mmol/L — ABNORMAL HIGH (ref 0.0–2.0)
Bicarbonate: 41.4 mmol/L — ABNORMAL HIGH (ref 20.0–28.0)
FIO2: 50
O2 Saturation: 98.4 %
Patient temperature: 37
pCO2 arterial: 84.7 mmHg (ref 32.0–48.0)
pH, Arterial: 7.31 — ABNORMAL LOW (ref 7.350–7.450)
pO2, Arterial: 117 mmHg — ABNORMAL HIGH (ref 83.0–108.0)

## 2019-08-28 LAB — BASIC METABOLIC PANEL
Anion gap: 7 (ref 5–15)
BUN: 27 mg/dL — ABNORMAL HIGH (ref 8–23)
CO2: 37 mmol/L — ABNORMAL HIGH (ref 22–32)
Calcium: 8.7 mg/dL — ABNORMAL LOW (ref 8.9–10.3)
Chloride: 94 mmol/L — ABNORMAL LOW (ref 98–111)
Creatinine, Ser: <0.3 mg/dL — ABNORMAL LOW (ref 0.61–1.24)
Glucose, Bld: 130 mg/dL — ABNORMAL HIGH (ref 70–99)
Potassium: 3.7 mmol/L (ref 3.5–5.1)
Sodium: 138 mmol/L (ref 135–145)

## 2019-08-28 LAB — MAGNESIUM: Magnesium: 2.1 mg/dL (ref 1.7–2.4)

## 2019-08-28 NOTE — Progress Notes (Signed)
Physical Therapy Treatment Patient Details Name: Steve [name] MRN: [id number] DOB: 1955/12/22 Today's Date: 08/28/2019    History of Present Illness 64 y/o gentleman with Hodgkin's lymphoma, previous Covid who has severe bilateral pneumonia vs pneumonitis with vent-dependent respiratory failure and pneuthoraces with pneumomediastinum.    PT Comments    The patient is very alert, mouthing to communicate, reports feeling hot and dizzy. Patient able to assist with rolling and partially sitting upright to move lines. The patient is progressing in all aspects of ADL's. Will progress to sitting bedside and attempt standing if pulmonary status will support.    Patient currently on 45% 5 PEEP, BP 136/89, HR 110-123, RR 22-28.   Follow Up Recommendations  LTACH     Equipment Recommendations  None recommended by PT    Recommendations for Other Services       Precautions / Restrictions Precautions Precautions: Fall Precaution Comments: vent, trach, left chest tubes, , peg tube,    Mobility  Bed Mobility Overal bed mobility: Needs Assistance Bed Mobility: Rolling Rolling: Min assist         General bed mobility comments: patient rolls with assist of CT and PEG tube, able to pull self forward in partial sitting forward to get line out from behind back  Transfers Overall transfer level: Needs assistance                  Ambulation/Gait                 Stairs             Wheelchair Mobility    Modified Rankin (Stroke Patients Only)       Balance Overall balance assessment: Needs assistance     Sitting balance - Comments: in sitting in chair, able to reposition trunk several times                                    Cognition Arousal/Alertness: Awake/alert Behavior During Therapy: WFL for tasks assessed/performed Overall Cognitive Status: Difficult to assess                                 General Comments:  patient mouthing" I am hot, I am dizzy", follows 1 step commands consistently      Exercises      General Comments        Pertinent Vitals/Pain Faces Pain Scale: No hurt    Home Living                      Prior Function            PT Goals (current goals can now be found in the care plan section) Progress towards PT goals: Progressing toward goals    Frequency    Min 2X/week      PT Plan Current plan remains appropriate;Frequency needs to be updated    Co-evaluation              AM-PAC PT "6 Clicks" Mobility   Outcome Measure  Help needed turning from your back to your side while in a flat bed without using bedrails?: A Lot Help needed moving from lying on your back to sitting on the side of a flat bed without using bedrails?: A Lot Help needed moving to and from a bed to a  chair (including a wheelchair)?: Total Help needed standing up from a chair using your arms (e.g., wheelchair or bedside chair)?: Total Help needed to walk in hospital room?: Total Help needed climbing 3-5 steps with a railing? : Total 6 Click Score: 8    End of Session   Activity Tolerance: Patient tolerated treatment well Patient left: in chair;with call bell/phone within reach Nurse Communication: Mobility status;Need for lift equipment PT Visit Diagnosis: Muscle weakness (generalized) (M62.81);Other symptoms and signs involving the nervous system (R29.898)     Time: 5909-3112 PT Time Calculation (min) (ACUTE ONLY): 31 min  Charges:  $Therapeutic Activity: 23-37 mins                     Steve [name] PT Acute Rehabilitation Services Pager [phone] Office [phone]    Steve [name] 08/28/2019, 1:36 PM

## 2019-08-28 NOTE — Progress Notes (Addendum)
NAME:  Steve [name], MRN:  [id number], DOB:  1955-04-20, LOS: 21 ADMISSION DATE:  07/08/2019, CONSULTATION DATE:  3/29 REFERRING MD:  [name]  CHIEF COMPLAINT:  Dyspnea   Brief History   64 y/o male, with hx COVID infection 05/2019, admitted with severe acute respiratory failure with hypoxemia in the setting of receiving chemotherapy for Hodgkin's lymphoma. ICU course prolonged due to ARDS, malnutrition.    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 ARDS. Bilateral PTX s/p CTx2 4/19 Remains on vent 60%/PEEP 8, likely to need trach  4/20 trach placed 4/26 Worsening SQ air, CT with tension pneumomediastinum, chest tube suction increased 4/29 On neo 41mg's 4/30 Weaning neo 5/4 weaning phenylephrine profoundly weak 5/5 add solucortef. Dc sxn to 20 bilateral desaturated, suction increased, but not clear that pneumo was actually worse 5/6: Suction once again reduced.  Stress dose steroids started, phenylephrine discontinued 5/7 chest tubes changed to waterseal, able to titrate midodrine down 5/10 Worked with PT.  SpO2 in low 90s on 50%.  Has intermittent tachypnea into high 20's and low 30's.  Pulled out NG tube overnight. 5/11 Improved down to 40%. IR panda pending. Sister at bedside- interested [place] Says patient has little kids and is fighter and wants full code. Per sister - he is more awake and interactive and significantly better than a week ago.  Ct chest with improved pneumomediastinum.  Significantly fibrotic lung.  Resolved right pneumothorax.  Small left pneumothorax remains.  Still on size 6 tracheostomy.  Plan replace with glide scope 5/12 Remains on 40% oxygen not much of tracheostomy  leak.  Bilateral chest tube persist.  Good upper extremity movement but weak on the lower extremities although able to move with a power of 1-2/5.  No real change.  Continues to be deconditioned.  Has Panda.  Order for PEG tube placed but pending. 5/13 R Chest tube removed, s/p PEG. Slowly getting stronger 5/18 Weaned on PSV for 35 minutes   Consults:  ID - s/o 4/09 Oncology - s/o 4/05  Procedures:  4/8 ETT > 4/20 4/20 Trach >> 4/9 bilateral chest tubes 5/13 PEG  Significant Diagnostic Tests:   CTA chest 3/29 > no PE, diffuse GGO upper lobes, patchy consolidation in bases, peripheral fibrotic changes noted on prior CT chest still present  ECHO 3/30 >LVEF 60 to 676% grade 1 diastolic dysfunction. Mildly dilated LA, normal RV. Trivial MR and TR,otherwise normal valves  CT Intraperitoneal air, likely extension of the pneumomediastinum/pneumothorax. An intra-abdominal etiology such as bowel perforation is favored less likely.Extensive pneumomediastinum and partially visualized right pneumothorax. Diarrheal state. Correlation with clinical exam and stool cultures recommended. No bowel obstruction. Normal appendix. Sigmoid diverticulosis  CT head 4/22 > NAICP, sub cutaneous air in neck, bilateral mastoid effusions CT Chest w/o 4/26 >> tension pneumomediastinum, bilateral chest tubes in place, continued widespread pulmonary opacity with development of some bronchiectasis  CT Chest 5/11 >> Ct chest with improved pneumomediastinum.  Significantly fibrotic lung.  Resolved right pneumothorax.  Small left pneumothorax remains.   Micro Data:  UC 3/29 >>negative  BCx2 3/29 >>negative  PJP 4/2 >>negative  Fungitel 3/29 >> positive Blastomyces Antigen 3/20 >> negative Respiratory Culture 4/4 >> staph epidermis >> S-vanco, clinda BAL 4/8 >>  candida glabrata Fungal (BAL) 4/8 >> Candida glabrata PJP (BAL) 4/8 >>negative  Aspergillus antigen (BAL) 4/8 >> Negative Tracheal aspirate 4/29 >>  negative  Antimicrobials:  Cefepime 3/29 >>3/31 Vanc 3/29 >>3/30 Azithromycin 3/29 >> 4/1 Fluconazole 3/29 >> 4/13 Meropenem 3/31 >> 4/6 Bactrim 3/31 >> 4/4 Clindamycin 4/5 >>4/20 Primaquine 4/5 >>4/20 Anidulafungin 4/12 >> 4/20 Bactrim (PCP prophylaxis)  xxxxxxxxxxx Vanc 5/13 x 1 dose for peg   Interim history/subjective:  Weaning on PSV today am. No acute issues   Objective   Blood pressure 128/81, pulse 96, temperature 98 F (36.7 C), temperature source Oral, resp. rate (!) 23, height _0  (1.676 m), weight 64.4 kg, SpO2 99 %.    Vent Mode: PSV;CPAP FiO2 (%):  [40 %-45 %] 45 % Set Rate:  [20 bmp] 20 bmp Vt Set:  [510 mL] 510 mL PEEP:  [5 cmH20] 5 cmH20 Pressure Support:  [12 cmH20] 12 cmH20 Plateau Pressure:  [31 cmH20-34 cmH20] 31 cmH20   Intake/Output Summary (Last 24 hours) at 08/28/2019 0950 Last data filed at 08/28/2019 0549 Gross per 24 hour  Intake 4641.02 ml  Output 1751 ml  Net 2890.02 ml   Filed Weights   08/26/19 0500 08/27/19 0440 08/28/19 0500  Weight: 66.9 kg 66.3 kg 64.4 kg     Examination  Gen:     Chronically ill appearing.  HEENT:  EOMI, sclera anicteric Neck:     No masses; no thyromegaly, trach Lungs:    Clear to auscultation bilaterally; normal respiratory effort CV:         Regular rate and rhythm; no murmurs Abd:      + bowel sounds; soft, non-tender; no palpable masses, no distension Ext:    No edema; adequate peripheral perfusion Skin:      Warm and dry; no rash Neuro: Awake, responsive  Resolved Hospital Problem list   Candida glabrata in BAL PJP pneumonia, suspected, treated for 21 days Hypernatremia  Acute metabolic encephalopathy , resolved. - Fentanyl tachyphylaxis.  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  Profound deconditioning and severe malnutrition status post prolonged critical illness seem to be the primary barrier to ventilator liberation with very poor  mechanics/ high wob c/w FP ARDS.  Acute on chronic Respiratory Failure due to ARDS, now trach and vent dependent.  Fibroproliferative changes from ARDS.  Previously significant barotrauma with bilateral pneumothoraces -continue PRVC 8cc/kg  -wean O2 for sats >88% -push PT efforts / mobilize - Left chest tube placed to suction with no evidence of pneumothorax.  Can probably remove once he has transitioned to trach collar. -daily SBT with repeat throughout day. Will need slow wean  -follow intermittent CXR  Sedation / Pain needs on Ventilator -pain control with oxycodone, PRN morphine, PRN ativan for anxiety   -continue zoloft   Opportunistic infection pneumonia - this admit (suspected PJP and cofnirmed candida glabrada) -Bactrim prophylaxis    Hypotension; seemingly responding to treatment of relative adrenal insufficiency.  Etiology likely multifactorial, medication related, relative adrenal insufficiency from prolonged critical illness.  Blood pressure remarkably improved after adding stress dose steroids remains off pressors -continue PT midodrine, fludrocortisone -stop stress dose steroids, if recurrent hypotension, would restart   Fluid and electrolyte imbalance: hypokalemia, hypomagnesemia  -monitor, replace K, Mg 5/18  -follow up lytes in am   Anemia of chronic illness Mild Thrombocytopenia Hodgkin lymphoma-partially treated Hx of hemoglobin <7 with hemodynamically significant bleeding.  Concern his partially treated lymphoma will become a  life limiting illness eventually. -trend CBC -transfuse for Hgb <7% or active bleeding  -continue iron supplementation   Hyperlipidemia. -lipitor  Severe protein calorie malnutrition. Dysphagia  Pulled out NGT multiple times with difficulty with replacement, s/p peg 5/13/211 -TF per nutrition  -advance TF to goal as tolerated  -discontinue D10 infusion if tolerating TF  Physical deconditioning. -PT / OT -will need LTAC for rehab  efforts   Sacral Decubitus ulcers. -WOC following, appreciate input  -pressure relief measures  Goals of care. -full code, multiple prior discussions    Best practice:  Diet: tube feeding   Pain/Anxiety/Delirium protocol (if indicated):   VAP protocol (if indicated): yes DVT prophylaxis: lovenox GI prophylaxis: PPI Glucose control: SSI Mobility: bed rest Code Status: full Family Communication: Sister and patient updated.  Awaiting transfer to [facility] today. Disposition: ICU.   The patient is critically ill with multiple organ system failure and requires high complexity decision making for assessment and support, frequent evaluation and titration of therapies, advanced monitoring, review of radiographic studies and interpretation of complex data.   Critical Care Time devoted to patient care services, exclusive of separately billable procedures, described in this note is 35 minutes.   Marshell Garfinkel MD [facility] Please see [email or web address] for pager details.  08/28/2019, 9:57 AM

## 2019-08-28 NOTE — TOC Progression Note (Signed)
Transition of Care [facility]) - Progression Note    Patient Details  Name: Steve [name] MRN: [id number] Date of Birth: [birth year]  Transition of Care [facility]) CM/SW Contact  Leeroy Cha, RN Phone Number: 08/28/2019, 10:30 AM  Clinical Narrative:    [facility] 5E10 DR. [facility] transport at 7pm via [facility]unit sec. To set up care link, call report to [phone].  McKenzie rn in charge of patient is aware.   Expected Discharge Plan: Long Term Acute Care (LTAC) Barriers to Discharge: Continued Medical Work up  Expected Discharge Plan and Services Expected Discharge Plan: [place] (LTAC)       Living arrangements for the past 2 months: Single Family Home Expected Discharge Date: 08/27/19                                     Social Determinants of Health (SDOH) Interventions    Readmission Risk Interventions Readmission Risk Prevention Plan 07/22/2019  Transportation Screening Complete  Medication Review Press photographer) Complete  Some recent data might be hidden

## 2019-08-29 ENCOUNTER — Encounter: Payer: Self-pay | Admitting: Internal Medicine

## 2019-08-29 DIAGNOSIS — J189 Pneumonia, unspecified organism: Secondary | ICD-10-CM

## 2019-08-29 DIAGNOSIS — C8190 Hodgkin lymphoma, unspecified, unspecified site: Secondary | ICD-10-CM | POA: Diagnosis present

## 2019-08-29 DIAGNOSIS — U071 COVID-19: Secondary | ICD-10-CM | POA: Diagnosis present

## 2019-08-29 DIAGNOSIS — J80 Acute respiratory distress syndrome: Secondary | ICD-10-CM | POA: Diagnosis present

## 2019-08-29 DIAGNOSIS — J9621 Acute and chronic respiratory failure with hypoxia: Secondary | ICD-10-CM

## 2019-08-29 DIAGNOSIS — J9611 Chronic respiratory failure with hypoxia: Secondary | ICD-10-CM | POA: Diagnosis present

## 2019-08-29 DIAGNOSIS — J9383 Other pneumothorax: Secondary | ICD-10-CM

## 2019-08-29 LAB — PHOSPHORUS: Phosphorus: 3.3 mg/dL (ref 2.5–4.6)

## 2019-08-29 LAB — COMPREHENSIVE METABOLIC PANEL
ALT: 23 U/L (ref 0–44)
AST: 21 U/L (ref 15–41)
Albumin: 2.2 g/dL — ABNORMAL LOW (ref 3.5–5.0)
Alkaline Phosphatase: 69 U/L (ref 38–126)
Anion gap: 8 (ref 5–15)
BUN: 14 mg/dL (ref 8–23)
CO2: 38 mmol/L — ABNORMAL HIGH (ref 22–32)
Calcium: 8.8 mg/dL — ABNORMAL LOW (ref 8.9–10.3)
Chloride: 93 mmol/L — ABNORMAL LOW (ref 98–111)
Creatinine, Ser: <0.3 mg/dL — ABNORMAL LOW (ref 0.61–1.24)
Glucose, Bld: 145 mg/dL — ABNORMAL HIGH (ref 70–99)
Potassium: 4.7 mmol/L (ref 3.5–5.1)
Sodium: 139 mmol/L (ref 135–145)
Total Bilirubin: 0.3 mg/dL (ref 0.3–1.2)
Total Protein: 6.3 g/dL — ABNORMAL LOW (ref 6.5–8.1)

## 2019-08-29 LAB — CBC WITH DIFFERENTIAL/PLATELET
Abs Immature Granulocytes: 0.02 10*3/uL (ref 0.00–0.07)
Basophils Absolute: 0 10*3/uL (ref 0.0–0.1)
Basophils Relative: 0 %
Eosinophils Absolute: 0.2 10*3/uL (ref 0.0–0.5)
Eosinophils Relative: 2 %
HCT: 31.1 % — ABNORMAL LOW (ref 39.0–52.0)
Hemoglobin: 9.2 g/dL — ABNORMAL LOW (ref 13.0–17.0)
Immature Granulocytes: 0 %
Lymphocytes Relative: 24 %
Lymphs Abs: 2.2 10*3/uL (ref 0.7–4.0)
MCH: 29.7 pg (ref 26.0–34.0)
MCHC: 29.6 g/dL — ABNORMAL LOW (ref 30.0–36.0)
MCV: 100.3 fL — ABNORMAL HIGH (ref 80.0–100.0)
Monocytes Absolute: 0.9 10*3/uL (ref 0.1–1.0)
Monocytes Relative: 10 %
Neutro Abs: 5.8 10*3/uL (ref 1.7–7.7)
Neutrophils Relative %: 64 %
Platelets: 201 10*3/uL (ref 150–400)
RBC: 3.1 MIL/uL — ABNORMAL LOW (ref 4.22–5.81)
RDW: 15.5 % (ref 11.5–15.5)
WBC: 9 10*3/uL (ref 4.0–10.5)
nRBC: 0 % (ref 0.0–0.2)

## 2019-08-29 LAB — BLOOD GAS, ARTERIAL
Acid-Base Excess: 15.6 mmol/L — ABNORMAL HIGH (ref 0.0–2.0)
Bicarbonate: 41.4 mmol/L — ABNORMAL HIGH (ref 20.0–28.0)
FIO2: 40
O2 Saturation: 93.2 %
Patient temperature: 37
pCO2 arterial: 70.3 mmHg (ref 32.0–48.0)
pH, Arterial: 7.388 (ref 7.350–7.450)
pO2, Arterial: 68.4 mmHg — ABNORMAL LOW (ref 83.0–108.0)

## 2019-08-29 LAB — MAGNESIUM: Magnesium: 1.7 mg/dL (ref 1.7–2.4)

## 2019-08-29 LAB — PROTIME-INR
INR: 1.1 (ref 0.8–1.2)
Prothrombin Time: 13.8 seconds (ref 11.4–15.2)

## 2019-08-29 LAB — APTT: aPTT: 40 seconds — ABNORMAL HIGH (ref 24–36)

## 2019-08-29 NOTE — Consult Note (Signed)
[facility] [place]  Date of Service: 08/29/2019  PULMONARY CRITICAL CARE CONSULT   JONELL KRONTZ  SEG:[id number]  DOB: 1956-01-07   DOA: 08/28/2019  Referring Physician: Merton Border, MD  HPI: VERNA HAMON is a 64 y.o. male seen for follow up of Acute on Chronic Respiratory Failure. Patient has multiple medical problems including diabetes mellitus Healthcare associated pneumonia Hodgkin's lymphoma hypertension pancreatitis sepsis who presents to the hospital with increasing shortness of breath.  Patient was noted to have desaturations down to 50s and even on oxygen.  Patient was evaluated and had worsening in the hospital and was transferred to the ICU ended up intubated.  Patient had chest x-ray which was showing diffuse lung disease subsequently was found to have Candida glabrata as well as some staph epidermidis pneumonia and suspected P JP pneumonia.  Ultimately cultures really did not grow P JP however patient was treated empirically.  He is now on the ventilator and full support and pressure control mode has a chest tube in place draining what appears to be bloody looking fluid.  Transferred to our facility for further management and attempt weaning.  Review of Systems:  ROS performed and is unremarkable other than noted above.  Past Medical History:  Diagnosis Date  . Abscess of muscle 08/10/2011   staph infection of right hip   . Diabetes mellitus without complication ([place])   . GERD (gastroesophageal reflux disease)    rare reflux - no meds for reflux - NO PROBLEM IN PAST SEVERAL YRS  . HCAP (healthcare-associated pneumonia)   . Hip dysplasia, congenital    no surgery as a child for hip dysplasia - has had bilateral hip replacements as an adult  . Hodgkin lymphoma ([place])   . Hypertension   . Pancreatitis   . Postoperative anemia due to acute blood loss 09/07/2012  . Septic arthritis of hip ([place]) 09/05/2012   PT'S  TOTAL HIP JOINT REMOVED - ANTIBIOTIC SPACE PLACED AND PT HAS FINISHED IV ANTIBIOTICS ( PICC LINE REMOVED)  . Sleep apnea    USES CPAP    Past Surgical History:  Procedure Laterality Date  . COLONOSCOPY  04/26/2007  . HERNIA REPAIR     inguinal hernia x3  . IR GASTROSTOMY TUBE MOD SED  08/22/2019  . IR IMAGING GUIDED PORT INSERTION  03/29/2019  . JOINT REPLACEMENT  2002 & 2007   bilateral hip replacement  . LYMPH NODE BIOPSY Right 03/20/2019   Procedure: DEEP RIGHT SUPRACLAVICULAR LYMPH NODE EXCISION;  Surgeon: Fanny Skates, MD;  Location: [facility];  Service: General;  Laterality: Right;  . MULTIPLE EXTRACTIONS WITH ALVEOLOPLASTY  07/28/2011   Procedure: MULTIPLE EXTRACION WITH ALVEOLOPLASTY;  Surgeon: Lenn Cal, DDS;  Location: [facility];  Service: Oral Surgery;  Laterality: N/A;  Extraction of tooth #'s 2,3,4,5,6,11,12,13,15,19,22 with alveoloplasty.  . shoulder repair - right for separation of shoulder    . TEE WITHOUT CARDIOVERSION  07/29/2011   Procedure: TRANSESOPHAGEAL ECHOCARDIOGRAM (TEE);  Surgeon: Josue Hector, MD;  Location: [facility];  Service: Cardiovascular;  Laterality: N/A;  . TOTAL HIP REVISION Right 09/05/2012   Procedure: RIGHT HIP RESECTION ARTHROPLASTY WITH ANTIBIOTIC SPACERS;  Surgeon: [name], MD;  Location: [facility];  Service: Orthopedics;  Laterality: Right;  . TOTAL HIP REVISION Right 11/30/2012   Procedure: RIGHT TOTAL HIP ARTHROPLASTY REIMPLANTATION;  Surgeon: [name], MD;  Location: [facility];  Service: Orthopedics;  Laterality: Right;  Social History:    reports that he has never smoked. He has never used smokeless tobacco. He reports current alcohol use. He reports that he does not use drugs.  Family History: Non-Contributory to the present illness  No Known Allergies  Medications: Reviewed on Rounds  Physical Exam:  Vitals: Temperature 97.2 pulse 87 respiratory 27 blood pressure 104/86 saturations  96%  Ventilator Settings on pressure assist control FiO2 40% tidal volume is 444 PEEP 5  . General: Comfortable at this time . Eyes: Grossly normal lids, irises & conjunctiva . ENT: grossly tongue is normal . Neck: no obvious mass . Cardiovascular: S1-S2 normal no gallop or rub . Respiratory: No rhonchi coarse breath sounds . Abdomen: Soft and nontender . Skin: no rash seen on limited exam . Musculoskeletal: not rigid . Psychiatric:unable to assess . Neurologic: no seizure no involuntary movements         Labs on Admission:  Basic Metabolic Panel: Recent Labs  Lab 08/23/19 0425 08/23/19 0425 08/24/19 0255 08/24/19 0865 08/26/19 0428 08/27/19 0509 08/28/19 0345 08/29/19 0749  NA 138   < >  --  136 141 140 138 139  K 3.9   < >  --  3.4* 3.9 3.3* 3.7 4.7  CL 99   < >  --  96* 98 95* 94* 93*  CO2 33*   < >  --  34* 39* 37* 37* 38*  GLUCOSE 156*   < >  --  118* 140* 100* 130* 145*  BUN 13   < >  --  27* 30* 22 27* 14  CREATININE 0.33*   < >  --  0.33* <0.30* <0.30* <0.30* <0.30*  CALCIUM 8.5*   < >  --  8.6* 8.6* 8.8* 8.7* 8.8*  MG 2.0  --  1.7  --   --  1.7 2.1 1.7  PHOS 3.1  --  3.5  --   --   --   --  3.3   < > = values in this interval not displayed.    Recent Labs  Lab 08/28/19 2156 08/29/19 0538  PHART 7.310* 7.388  PCO2ART 84.7* 70.3*  PO2ART 117* 68.4*  HCO3 41.4* 41.4*  O2SAT 98.4 93.2    Liver Function Tests: Recent Labs  Lab 08/27/19 0509 08/29/19 0749  AST 16 21  ALT 18 23  ALKPHOS 57 69  BILITOT 0.5 0.3  PROT 6.1* 6.3*  ALBUMIN 2.4* 2.2*   No results for input(s): LIPASE, AMYLASE in the last 168 hours. No results for input(s): AMMONIA in the last 168 hours.  CBC: Recent Labs  Lab 08/26/19 0428 08/27/19 0509 08/28/19 0345 08/29/19 0749  WBC 8.0 8.3 11.4* 9.0  NEUTROABS  --  4.8  --  5.8  HGB 9.0* 9.3* 9.1* 9.2*  HCT 30.2* 30.6* 30.4* 31.1*  MCV 100.7* 100.7* 99.7 100.3*  PLT 191 173 208 201    Cardiac Enzymes: No results for  input(s): CKTOTAL, CKMB, CKMBINDEX, TROPONINI in the last 168 hours.  BNP (last 3 results) Recent Labs    07/08/19 1815 07/11/19 0212 07/14/19 0458  BNP 407.1* 224.8* 55.9    ProBNP (last 3 results) No results for input(s): PROBNP in the last 8760 hours.   Radiological Exams on Admission: DG Chest 1 View  Result Date: 08/28/2019 CLINICAL DATA:  Pneumothorax. EXAM: CHEST  1 VIEW COMPARISON:  Aug 27, 2019. FINDINGS: Stable cardiomegaly. Tracheostomy tube is in good position. Left-sided Port-A-Cath is unchanged in position. Stable position of left-sided  chest tube without pneumothorax. Stable bilateral lung opacities are noted most consistent with pulmonary fibrosis, although superimposed inflammation cannot be excluded. Bony thorax is unremarkable. IMPRESSION: Stable bilateral lung opacities are noted most consistent with pulmonary fibrosis, although superimposed inflammation cannot be excluded. Stable position of left-sided chest tube without pneumothorax. Electronically Signed   By: Marijo Conception M.D.   On: 08/28/2019 08:57   DG ABDOMEN PEG TUBE LOCATION  Result Date: 08/28/2019 CLINICAL DATA:  64 year old male status post endoscopic PEG tube placement. EXAM: ABDOMEN - 1 VIEW COMPARISON:  KUB 08/20/2019 and earlier. FINDINGS: 50 mL of Omnipaque 300 contrast was injected via the gastrostomy tube prior to this image. Portable AP supine view at 2056 hours. Enteric feeding tube appears to be removed. Normal contrast filling of the stomach and early contrast extension to the duodenum. No adverse features. Visible bowel gas pattern elsewhere is stable. Previous bilateral total hip arthroplasty. No acute osseous abnormality identified. IMPRESSION: PEG tube contrast injection with no adverse features. Electronically Signed   By: Genevie Ann M.D.   On: 08/28/2019 21:10   DG Chest Port 1 View  Result Date: 08/28/2019 CLINICAL DATA:  Chest tube in place.  Peg tube. EXAM: PORTABLE CHEST 1 VIEW  COMPARISON:  Chest x-ray dated Aug 28, 2019 FINDINGS: The tracheostomy tube terminates above the carina. There is a left-sided subclavian Port-A-Cath in place. The positioning is stable. Diffuse hazy bilateral airspace opacities are again noted. There is no pneumothorax. There may be small bilateral pleural effusions. There is no acute osseous abnormality. There is a left-sided chest tube in place. IMPRESSION: 1. Lines and tubes as above.  No pneumothorax. 2. Stable appearance of the chest. Electronically Signed   By: Constance Holster M.D.   On: 08/28/2019 21:09   DG CHEST PORT 1 VIEW  Result Date: 08/27/2019 CLINICAL DATA:  Pneumothorax. EXAM: PORTABLE CHEST 1 VIEW COMPARISON:  Aug 26, 2019. FINDINGS: Stable cardiomegaly. Tracheostomy tube is in good position. Left internal jugular Port-A-Cath is unchanged in position. No pneumothorax or significant pleural effusion is noted. Left-sided chest tube is noted without pneumothorax. Stable bilateral lung opacities are noted consistent with a history of pulmonary fibrosis, although acute superimposed inflammation or edema cannot be excluded. Bony thorax is unremarkable. IMPRESSION: Left-sided chest tube is noted without pneumothorax. Stable bilateral lung opacities are noted consistent with a history of pulmonary fibrosis, although acute superimposed inflammation or edema cannot be excluded. Electronically Signed   By: Marijo Conception M.D.   On: 08/27/2019 14:11   DG CHEST PORT 1 VIEW  Result Date: 08/26/2019 CLINICAL DATA:  Pneumothorax follow-up EXAM: PORTABLE CHEST 1 VIEW COMPARISON:  Yesterday FINDINGS: Tracheostomy tube in place. Left-sided chest tube in similar position. No visible pneumothorax, limited by chin positioning. Left port with tip at the right atrium. Low volume chest with interstitial coarsening on both sides from fibrosis by CT. IMPRESSION: 1. No visible pneumothorax today, but limited by chin positioning over the apex. 2. Pulmonary fibrosis  without acute opacity. Electronically Signed   By: Monte Fantasia M.D.   On: 08/26/2019 06:40    Assessment/Plan Active Problems:   Acute on chronic respiratory failure with hypoxia ([name])   Acute respiratory distress syndrome (ARDS) due to COVID-19 virus ([name])   Hodgkin's lymphoma ([name])   Pneumothorax, acute   Healthcare-associated pneumonia   COVID-19 virus infection   1. Acute on chronic respiratory failure hypoxia patient continues to be on the ventilator and full support.  Respiratory therapy will do  an RSB I and assess for weaning readiness.  Currently mechanics do not look feasible for weaning. 2. Healthcare associated pneumonia patient was treated with antibiotics which were at the acute facility.  We will continue to follow-up radiologically. 3. COVID-19 pneumonia patient apparently had COVID-19 back in February of this year some of the changes on the x-ray are likely related to that. 4. Pneumothorax patient is status post chest tube placement last chest x-ray looked improved we will continue to monitor eventual removal of chest tube. 5. Hodgkin's lymphoma patient has been treated with chemotherapy last round was on March 8.  We will continue to follow along  I have personally seen and evaluated the patient, evaluated laboratory and imaging results, formulated the assessment and plan and placed orders. The Patient requires high complexity decision making with multiple systems involvement.  Case was discussed on Rounds with the Respiratory Therapy Director and the Respiratory staff Time Spent 82minutes  Giulietta Prokop A Kyndra Condron, MD [facility] Pulmonary Critical Care Medicine Sleep Medicine

## 2019-08-30 LAB — MAGNESIUM: Magnesium: 2 mg/dL (ref 1.7–2.4)

## 2019-08-30 NOTE — Progress Notes (Addendum)
Pulmonary Critical Care Medicine [place]   PULMONARY CRITICAL CARE SERVICE  PROGRESS NOTE  Date of Service: 08/30/2019  Steve [name]  NLZ:[id number]  DOB: Jan 02, 1956   DOA: 08/28/2019  Referring Physician: Merton Border, MD  HPI: Steve [name] is a 64 y.o. male seen for follow up of Acute on Chronic Respiratory Failure.  Patient completed 1 hour on pressure support today before his sats dropped to the 80s and he had increased work of breathing.  Was placed back on full support on the ventilator currently on 50% FiO2.  Satting well at this time.  Medications: Reviewed on Rounds  Physical Exam:  Vitals: Pulse 89 respirations 38 BP 116/80 O2 sat 97% temp 97.0  Ventilator Settings ventilator mode AC PC rate 24 respiratory pressure 28 PEEP of 5 and FiO2 50%  . General: Comfortable at this time . Eyes: Grossly normal lids, irises & conjunctiva . ENT: grossly tongue is normal . Neck: no obvious mass . Cardiovascular: S1 S2 normal no gallop . Respiratory: No rales or rhonchi noted . Abdomen: soft . Skin: no rash seen on limited exam . Musculoskeletal: not rigid . Psychiatric:unable to assess . Neurologic: no seizure no involuntary movements         Lab Data:   Basic Metabolic Panel: Recent Labs  Lab 08/24/19 0255 08/24/19 0948 08/26/19 0428 08/27/19 0509 08/28/19 0345 08/29/19 0749 08/30/19 0535  NA  --  136 141 140 138 139  --   K  --  3.4* 3.9 3.3* 3.7 4.7  --   CL  --  96* 98 95* 94* 93*  --   CO2  --  34* 39* 37* 37* 38*  --   GLUCOSE  --  118* 140* 100* 130* 145*  --   BUN  --  27* 30* 22 27* 14  --   CREATININE  --  0.33* <0.30* <0.30* <0.30* <0.30*  --   CALCIUM  --  8.6* 8.6* 8.8* 8.7* 8.8*  --   MG 1.7  --   --  1.7 2.1 1.7 2.0  PHOS 3.5  --   --   --   --  3.3  --     ABG: Recent Labs  Lab 08/28/19 2156 08/29/19 0538  PHART 7.310* 7.388  PCO2ART 84.7* 70.3*  PO2ART 117* 68.4*  HCO3 41.4* 41.4*  O2SAT 98.4 93.2     Liver Function Tests: Recent Labs  Lab 08/27/19 0509 08/29/19 0749  AST 16 21  ALT 18 23  ALKPHOS 57 69  BILITOT 0.5 0.3  PROT 6.1* 6.3*  ALBUMIN 2.4* 2.2*   No results for input(s): LIPASE, AMYLASE in the last 168 hours. No results for input(s): AMMONIA in the last 168 hours.  CBC: Recent Labs  Lab 08/26/19 0428 08/27/19 0509 08/28/19 0345 08/29/19 0749  WBC 8.0 8.3 11.4* 9.0  NEUTROABS  --  4.8  --  5.8  HGB 9.0* 9.3* 9.1* 9.2*  HCT 30.2* 30.6* 30.4* 31.1*  MCV 100.7* 100.7* 99.7 100.3*  PLT 191 173 208 201    Cardiac Enzymes: No results for input(s): CKTOTAL, CKMB, CKMBINDEX, TROPONINI in the last 168 hours.  BNP (last 3 results) Recent Labs    07/08/19 1815 07/11/19 0212 07/14/19 0458  BNP 407.1* 224.8* 55.9    ProBNP (last 3 results) No results for input(s): PROBNP in the last 8760 hours.  Radiological Exams: DG ABDOMEN PEG TUBE LOCATION  Result Date: 08/28/2019 CLINICAL DATA:  64 year old male status  post endoscopic PEG tube placement. EXAM: ABDOMEN - 1 VIEW COMPARISON:  KUB 08/20/2019 and earlier. FINDINGS: 50 mL of Omnipaque 300 contrast was injected via the gastrostomy tube prior to this image. Portable AP supine view at 2056 hours. Enteric feeding tube appears to be removed. Normal contrast filling of the stomach and early contrast extension to the duodenum. No adverse features. Visible bowel gas pattern elsewhere is stable. Previous bilateral total hip arthroplasty. No acute osseous abnormality identified. IMPRESSION: PEG tube contrast injection with no adverse features. Electronically Signed   By: Genevie Ann M.D.   On: 08/28/2019 21:10   DG Chest Port 1 View  Result Date: 08/28/2019 CLINICAL DATA:  Chest tube in place.  Peg tube. EXAM: PORTABLE CHEST 1 VIEW COMPARISON:  Chest x-ray dated Aug 28, 2019 FINDINGS: The tracheostomy tube terminates above the carina. There is a left-sided subclavian Port-A-Cath in place. The positioning is stable. Diffuse  hazy bilateral airspace opacities are again noted. There is no pneumothorax. There may be small bilateral pleural effusions. There is no acute osseous abnormality. There is a left-sided chest tube in place. IMPRESSION: 1. Lines and tubes as above.  No pneumothorax. 2. Stable appearance of the chest. Electronically Signed   By: Constance Holster M.D.   On: 08/28/2019 21:09    Assessment/Plan Active Problems:   Acute on chronic respiratory failure with hypoxia ([name])   Acute respiratory distress syndrome (ARDS) due to COVID-19 virus ([name])   Hodgkin's lymphoma ([name])   Pneumothorax, acute   Healthcare-associated pneumonia   COVID-19 virus infection   1. Acute on chronic respiratory failure hypoxia patient was only able to tolerate weaning for 1 hour on pressure support will continue to attempt weaning as per protocol.  Continue supportive measures pulmonary toilet right now remains on full support on the ventilator currently requiring 50% FiO2. 2. Healthcare associated pneumonia stable at this time continue to follow radiologically. 3. COVID-19 pneumonia patient apparently had COVID-19 back in February of this year some of the changes on the x-ray are likely related to that. 4. Pneumothorax patient is status post chest tube placement last chest x-ray looked improved we will continue to monitor eventual removal of chest tube. 5. Hodgkin's lymphoma patient has been treated with chemotherapy last round was on March 8.  We will continue to follow along   I have personally seen and evaluated the patient, evaluated laboratory and imaging results, formulated the assessment and plan and placed orders. The Patient requires high complexity decision making with multiple systems involvement.  Rounds were done with the Respiratory Therapy Director and Staff therapists and discussed with nursing staff also.  Allyne Gee, MD [facility] Pulmonary Critical Care Medicine Sleep Medicine

## 2019-08-31 LAB — ACID FAST CULTURE WITH REFLEXED SENSITIVITIES (MYCOBACTERIA): Acid Fast Culture: NEGATIVE

## 2019-08-31 NOTE — Progress Notes (Signed)
Pulmonary Critical Care Medicine [place]   PULMONARY CRITICAL CARE SERVICE  PROGRESS NOTE  Date of Service: 08/31/2019  Steve [name]  VFI:[id number]  DOB: 10-Jul-1955   DOA: 08/28/2019  Referring Physician: Merton Border, MD  HPI: Steve [name] is a 64 y.o. male seen for follow up of Acute on Chronic Respiratory Failure.  Patient currently is on the ventilator and full support.  Has been having some issues with being able to tolerate the wean because of tachypnea right now is on 50% FiO2  Medications: Reviewed on Rounds  Physical Exam:  Vitals: Temperature is 98.2 pulse 88 respiratory rate 28 blood pressure is 97/64 saturations 100%  Ventilator Settings: Pressure assist control FiO2 50% tidal volume 400 PEEP 5  . General: Comfortable at this time . Eyes: Grossly normal lids, irises & conjunctiva . ENT: grossly tongue is normal . Neck: no obvious mass . Cardiovascular: S1 S2 normal no gallop . Respiratory: No rhonchi no rales are noted at this time . Abdomen: soft . Skin: no rash seen on limited exam . Musculoskeletal: not rigid . Psychiatric:unable to assess . Neurologic: no seizure no involuntary movements         Lab Data:   Basic Metabolic Panel: Recent Labs  Lab 08/26/19 0428 08/27/19 0509 08/28/19 0345 08/29/19 0749 08/30/19 0535  NA 141 140 138 139  --   K 3.9 3.3* 3.7 4.7  --   CL 98 95* 94* 93*  --   CO2 39* 37* 37* 38*  --   GLUCOSE 140* 100* 130* 145*  --   BUN 30* 22 27* 14  --   CREATININE <0.30* <0.30* <0.30* <0.30*  --   CALCIUM 8.6* 8.8* 8.7* 8.8*  --   MG  --  1.7 2.1 1.7 2.0  PHOS  --   --   --  3.3  --     ABG: Recent Labs  Lab 08/28/19 2156 08/29/19 0538  PHART 7.310* 7.388  PCO2ART 84.7* 70.3*  PO2ART 117* 68.4*  HCO3 41.4* 41.4*  O2SAT 98.4 93.2    Liver Function Tests: Recent Labs  Lab 08/27/19 0509 08/29/19 0749  AST 16 21  ALT 18 23  ALKPHOS 57 69  BILITOT 0.5 0.3  PROT 6.1* 6.3*   ALBUMIN 2.4* 2.2*   No results for input(s): LIPASE, AMYLASE in the last 168 hours. No results for input(s): AMMONIA in the last 168 hours.  CBC: Recent Labs  Lab 08/26/19 0428 08/27/19 0509 08/28/19 0345 08/29/19 0749  WBC 8.0 8.3 11.4* 9.0  NEUTROABS  --  4.8  --  5.8  HGB 9.0* 9.3* 9.1* 9.2*  HCT 30.2* 30.6* 30.4* 31.1*  MCV 100.7* 100.7* 99.7 100.3*  PLT 191 173 208 201    Cardiac Enzymes: No results for input(s): CKTOTAL, CKMB, CKMBINDEX, TROPONINI in the last 168 hours.  BNP (last 3 results) Recent Labs    07/08/19 1815 07/11/19 0212 07/14/19 0458  BNP 407.1* 224.8* 55.9    ProBNP (last 3 results) No results for input(s): PROBNP in the last 8760 hours.  Radiological Exams: No results found.  Assessment/Plan Active Problems:   Acute on chronic respiratory failure with hypoxia ([name])   Acute respiratory distress syndrome (ARDS) due to COVID-19 virus ([name])   Hodgkin's lymphoma ([name])   Pneumothorax, acute   Healthcare-associated pneumonia   COVID-19 virus infection   1. Acute on chronic respiratory failure hypoxia we will continue with T collar trials titrate oxygen continue pulmonary toilet.  2. Hodgkin's lymphoma will need follow-up after discharge. 3. Pneumothorax at baseline we will continue with supportive care 4. Healthcare associated pneumonia treated improving 5. COVID-19 virus infection in recovery phase   I have personally seen and evaluated the patient, evaluated laboratory and imaging results, formulated the assessment and plan and placed orders. The Patient requires high complexity decision making with multiple systems involvement.  Rounds were done with the Respiratory Therapy Director and Staff therapists and discussed with nursing staff also.  Allyne Gee, MD [facility] Pulmonary Critical Care Medicine Sleep Medicine

## 2019-09-01 NOTE — Progress Notes (Signed)
Pulmonary Critical Care Medicine [place]   PULMONARY CRITICAL CARE SERVICE  PROGRESS NOTE  Date of Service: 09/01/2019  Steve [name]  ZHG:[id number]  DOB: Aug 15, 1955   DOA: 08/28/2019  Referring Physician: Merton Border, MD  HPI: Steve [name] is a 64 y.o. male seen for follow up of Acute on Chronic Respiratory Failure.  Patient currently is on the ventilator was attempted at weaning however did not tolerate it.  Right now is on pressure assist control mode has been on 40% FiO2.  Medications: Reviewed on Rounds  Physical Exam:  Vitals: Temperature 96.7 pulse 100 respiratory rate 26 blood pressure is 126/67 saturations 97%  Ventilator Settings mode ventilation pressure assist control FiO2 40% tidal volume 505 PEEP 5 IP 28  . General: Comfortable at this time . Eyes: Grossly normal lids, irises & conjunctiva . ENT: grossly tongue is normal . Neck: no obvious mass . Cardiovascular: S1 S2 normal no gallop . Respiratory: No rhonchi coarse breath sounds . Abdomen: soft . Skin: no rash seen on limited exam . Musculoskeletal: not rigid . Psychiatric:unable to assess . Neurologic: no seizure no involuntary movements         Lab Data:   Basic Metabolic Panel: Recent Labs  Lab 08/26/19 0428 08/27/19 0509 08/28/19 0345 08/29/19 0749 08/30/19 0535  NA 141 140 138 139  --   K 3.9 3.3* 3.7 4.7  --   CL 98 95* 94* 93*  --   CO2 39* 37* 37* 38*  --   GLUCOSE 140* 100* 130* 145*  --   BUN 30* 22 27* 14  --   CREATININE <0.30* <0.30* <0.30* <0.30*  --   CALCIUM 8.6* 8.8* 8.7* 8.8*  --   MG  --  1.7 2.1 1.7 2.0  PHOS  --   --   --  3.3  --     ABG: Recent Labs  Lab 08/28/19 2156 08/29/19 0538  PHART 7.310* 7.388  PCO2ART 84.7* 70.3*  PO2ART 117* 68.4*  HCO3 41.4* 41.4*  O2SAT 98.4 93.2    Liver Function Tests: Recent Labs  Lab 08/27/19 0509 08/29/19 0749  AST 16 21  ALT 18 23  ALKPHOS 57 69  BILITOT 0.5 0.3  PROT 6.1* 6.3*   ALBUMIN 2.4* 2.2*   No results for input(s): LIPASE, AMYLASE in the last 168 hours. No results for input(s): AMMONIA in the last 168 hours.  CBC: Recent Labs  Lab 08/26/19 0428 08/27/19 0509 08/28/19 0345 08/29/19 0749  WBC 8.0 8.3 11.4* 9.0  NEUTROABS  --  4.8  --  5.8  HGB 9.0* 9.3* 9.1* 9.2*  HCT 30.2* 30.6* 30.4* 31.1*  MCV 100.7* 100.7* 99.7 100.3*  PLT 191 173 208 201    Cardiac Enzymes: No results for input(s): CKTOTAL, CKMB, CKMBINDEX, TROPONINI in the last 168 hours.  BNP (last 3 results) Recent Labs    07/08/19 1815 07/11/19 0212 07/14/19 0458  BNP 407.1* 224.8* 55.9    ProBNP (last 3 results) No results for input(s): PROBNP in the last 8760 hours.  Radiological Exams: No results found.  Assessment/Plan Active Problems:   Acute on chronic respiratory failure with hypoxia ([name])   Acute respiratory distress syndrome (ARDS) due to COVID-19 virus ([name])   Hodgkin's lymphoma ([name])   Pneumothorax, acute   Healthcare-associated pneumonia   COVID-19 virus infection   1. Acute on chronic respiratory failure with hypoxia plan is to have respiratory continue to assess the RSB I and mechanics.  Today it did not appear apparent that patient was going to be able to wean 2. Hodgkin's lymphoma in remission we will continue with supportive care. 3. Pneumothorax still has chest tube in place follow x-rays 4. Healthcare associated pneumonia treated we will continue to follow 5. COVID-19 virus infection acute infection has resolved we will continue to monitor 6. ARDS secondary to COVID-19 we will continue to follow-up radiologically patient has residual damage related to the infection.   I have personally seen and evaluated the patient, evaluated laboratory and imaging results, formulated the assessment and plan and placed orders. The Patient requires high complexity decision making with multiple systems involvement.  Rounds were done with the Respiratory Therapy  Director and Staff therapists and discussed with nursing staff also.  Time 35 minutes  Allyne Gee, MD [facility] Pulmonary Critical Care Medicine Sleep Medicine

## 2019-09-02 ENCOUNTER — Other Ambulatory Visit (HOSPITAL_COMMUNITY): Payer: BC Managed Care – PPO

## 2019-09-02 DIAGNOSIS — Z9689 Presence of other specified functional implants: Secondary | ICD-10-CM

## 2019-09-02 NOTE — Progress Notes (Signed)
Pulmonary Critical Care Medicine [place]   PULMONARY CRITICAL CARE SERVICE  PROGRESS NOTE  Date of Service: 09/02/2019  Steve [name]  [id number]:[id number]  DOB: 1955/04/15   DOA: 08/28/2019  Referring Physician: Merton Border, MD  HPI: Steve [name] is a 64 y.o. male seen for follow up of Acute on Chronic Respiratory Failure.  Patient is on full support right now was attempted on pressure support again today but did not tolerate.  Right now is on pressure control mode  Medications: Reviewed on Rounds  Physical Exam:  Vitals: Temperature is 96.1 pulse 97 respiratory rate 30 blood pressure is 160/86 saturations 96%  Ventilator Settings mode of ventilation pressure assist control FiO2 40% tidal volume 364 IP 28 PEEP 5  . General: Comfortable at this time . Eyes: Grossly normal lids, irises & conjunctiva . ENT: grossly tongue is normal . Neck: no obvious mass . Cardiovascular: S1 S2 normal no gallop . Respiratory: No rhonchi no rales are noted at this time . Abdomen: soft . Skin: no rash seen on limited exam . Musculoskeletal: not rigid . Psychiatric:unable to assess . Neurologic: no seizure no involuntary movements         Lab Data:   Basic Metabolic Panel: Recent Labs  Lab 08/27/19 0509 08/28/19 0345 08/29/19 0749 08/30/19 0535  NA 140 138 139  --   K 3.3* 3.7 4.7  --   CL 95* 94* 93*  --   CO2 37* 37* 38*  --   GLUCOSE 100* 130* 145*  --   BUN 22 27* 14  --   CREATININE <0.30* <0.30* <0.30*  --   CALCIUM 8.8* 8.7* 8.8*  --   MG 1.7 2.1 1.7 2.0  PHOS  --   --  3.3  --     ABG: Recent Labs  Lab 08/28/19 2156 08/29/19 0538  PHART 7.310* 7.388  PCO2ART 84.7* 70.3*  PO2ART 117* 68.4*  HCO3 41.4* 41.4*  O2SAT 98.4 93.2    Liver Function Tests: Recent Labs  Lab 08/27/19 0509 08/29/19 0749  AST 16 21  ALT 18 23  ALKPHOS 57 69  BILITOT 0.5 0.3  PROT 6.1* 6.3*  ALBUMIN 2.4* 2.2*   No results for input(s): LIPASE, AMYLASE in  the last 168 hours. No results for input(s): AMMONIA in the last 168 hours.  CBC: Recent Labs  Lab 08/27/19 0509 08/28/19 0345 08/29/19 0749  WBC 8.3 11.4* 9.0  NEUTROABS 4.8  --  5.8  HGB 9.3* 9.1* 9.2*  HCT 30.6* 30.4* 31.1*  MCV 100.7* 99.7 100.3*  PLT 173 208 201    Cardiac Enzymes: No results for input(s): CKTOTAL, CKMB, CKMBINDEX, TROPONINI in the last 168 hours.  BNP (last 3 results) Recent Labs    07/08/19 1815 07/11/19 0212 07/14/19 0458  BNP 407.1* 224.8* 55.9    ProBNP (last 3 results) No results for input(s): PROBNP in the last 8760 hours.  Radiological Exams: DG CHEST PORT 1 VIEW  Result Date: 09/02/2019 CLINICAL DATA:  Hypoxia EXAM: PORTABLE CHEST 1 VIEW COMPARISON:  Aug 28, 2019 FINDINGS: Tracheostomy catheter tip is 3.9 cm above the carina. Port-A-Cath tip is in the superior vena cava near the cavoatrial junction. Chest tube is present on the left. There is a stable trace pneumothorax on the left laterally. There is interstitial thickening and patchy airspace opacity throughout the lungs bilaterally, essentially stable bilaterally. Heart is upper normal in size with pulmonary vascularity normal. No adenopathy. No bone lesions. There are  surgical clips in or overlying the right axillary region. IMPRESSION: Tube and catheter positions as described. Stable trace pneumothorax on the left laterally. Multifocal airspace opacity, slightly increased on the right and stable on the left. 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 A degree of superimposed pneumonia cannot be excluded. Stable cardiac silhouette. Electronically Signed   By: Lowella Grip III M.D.   On: 09/02/2019 08:13    Assessment/Plan Active Problems:   Acute on chronic respiratory failure with hypoxia ([name])   Acute respiratory distress syndrome (ARDS) due to COVID-19 virus ([name])   Hodgkin's lymphoma ([name])   Pneumothorax, acute   Healthcare-associated pneumonia   COVID-19 virus  infection   1. Acute on chronic respiratory failure hypoxia plan is to continue with pressure assist control with an FiO2 of 40% patient is requiring an IP of 28 with a PEEP of 5 currently we will continue with supportive care 2. ARDS treated continue present management 3. Hodgkin's lymphoma will need follow-up after discharge 4. Acute pneumothorax patient has chest tube in place 5. Healthcare associated pneumonia chest x-ray still showing multifocal disease we will continue with supportive care 6. COVID-19 virus infection in recovery phase   I have personally seen and evaluated the patient, evaluated laboratory and imaging results, formulated the assessment and plan and placed orders. The Patient requires high complexity decision making with multiple systems involvement.  Rounds were done with the Respiratory Therapy Director and Staff therapists and discussed with nursing staff also.  Time 35 minutes  Allyne Gee, MD [facility] Pulmonary Critical Care Medicine Sleep Medicine

## 2019-09-03 NOTE — Progress Notes (Signed)
Pulmonary Critical Care Medicine [place]   PULMONARY CRITICAL CARE SERVICE  PROGRESS NOTE  Date of Service: 09/03/2019  Steve [name]  ZCH:[id number]  DOB: May 28, 1955   DOA: 08/28/2019  Referring Physician: Merton Border, MD  HPI: Steve [name] is a 64 y.o. male seen for follow up of Acute on Chronic Respiratory Failure.  Patient is on full support right now on pressure control mode has been on 50% FiO2 with an IP of 28 was attempted at weaning however patient had increased heart rate so therefore the weaning was terminated.  Respiratory therapy will reassess again this afternoon  Medications: Reviewed on Rounds  Physical Exam:  Vitals: Temperature 98.1 pulse 99 respiratory 35 blood pressure is 105/52 saturations 97%  Ventilator Settings on pressure control mode currently on 50% FiO2 with good saturations  . General: Comfortable at this time . Eyes: Grossly normal lids, irises & conjunctiva . ENT: grossly tongue is normal . Neck: no obvious mass . Cardiovascular: S1 S2 normal no gallop . Respiratory: No rhonchi coarse breath sounds . Abdomen: soft . Skin: no rash seen on limited exam . Musculoskeletal: not rigid . Psychiatric:unable to assess . Neurologic: no seizure no involuntary movements         Lab Data:   Basic Metabolic Panel: Recent Labs  Lab 08/28/19 0345 08/29/19 0749 08/30/19 0535  NA 138 139  --   K 3.7 4.7  --   CL 94* 93*  --   CO2 37* 38*  --   GLUCOSE 130* 145*  --   BUN 27* 14  --   CREATININE <0.30* <0.30*  --   CALCIUM 8.7* 8.8*  --   MG 2.1 1.7 2.0  PHOS  --  3.3  --     ABG: Recent Labs  Lab 08/28/19 2156 08/29/19 0538  PHART 7.310* 7.388  PCO2ART 84.7* 70.3*  PO2ART 117* 68.4*  HCO3 41.4* 41.4*  O2SAT 98.4 93.2    Liver Function Tests: Recent Labs  Lab 08/29/19 0749  AST 21  ALT 23  ALKPHOS 69  BILITOT 0.3  PROT 6.3*  ALBUMIN 2.2*   No results for input(s): LIPASE, AMYLASE in the last 168  hours. No results for input(s): AMMONIA in the last 168 hours.  CBC: Recent Labs  Lab 08/28/19 0345 08/29/19 0749  WBC 11.4* 9.0  NEUTROABS  --  5.8  HGB 9.1* 9.2*  HCT 30.4* 31.1*  MCV 99.7 100.3*  PLT 208 201    Cardiac Enzymes: No results for input(s): CKTOTAL, CKMB, CKMBINDEX, TROPONINI in the last 168 hours.  BNP (last 3 results) Recent Labs    07/08/19 1815 07/11/19 0212 07/14/19 0458  BNP 407.1* 224.8* 55.9    ProBNP (last 3 results) No results for input(s): PROBNP in the last 8760 hours.  Radiological Exams: DG CHEST PORT 1 VIEW  Result Date: 09/02/2019 CLINICAL DATA:  Hypoxia EXAM: PORTABLE CHEST 1 VIEW COMPARISON:  Aug 28, 2019 FINDINGS: Tracheostomy catheter tip is 3.9 cm above the carina. Port-A-Cath tip is in the superior vena cava near the cavoatrial junction. Chest tube is present on the left. There is a stable trace pneumothorax on the left laterally. There is interstitial thickening and patchy airspace opacity throughout the lungs bilaterally, essentially stable bilaterally. Heart is upper normal in size with pulmonary vascularity normal. No adenopathy. No bone lesions. There are surgical clips in or overlying the right axillary region. IMPRESSION: Tube and catheter positions as described. Stable trace pneumothorax on the  left laterally. Multifocal airspace opacity, slightly increased on the right and stable on the left. 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 A degree of superimposed pneumonia cannot be excluded. Stable cardiac silhouette. Electronically Signed   By: Lowella Grip III M.D.   On: 09/02/2019 08:13    Assessment/Plan Active Problems:   Acute on chronic respiratory failure with hypoxia ([name])   Acute respiratory distress syndrome (ARDS) due to COVID-19 virus ([name])   Hodgkin's lymphoma ([name])   Pneumothorax, acute   Healthcare-associated pneumonia   COVID-19 virus infection   1. Acute on chronic respiratory failure hypoxia we  will continue with pressure control mode titrate oxygen as tolerated.  Will reassess again ability to wean.  The last chest x-ray reveals slight increase are stable opacity in the multifocal areas.  There is probably some underlying fibrosis also related to the COVID-19 2. ARDS advanced disease with severe hypoxia slow recovery 3. Hodgkin's lymphoma follow-up after discharge 4. Pneumothorax resolved 5. Healthcare associated pneumonia treated patient has multifocal changes noted on the chest film likely representing changes related to COVID-19. 6. COVID-19 virus infection itself has resolved however patient has severe residual changes noted   I have personally seen and evaluated the patient, evaluated laboratory and imaging results, formulated the assessment and plan and placed orders. The Patient requires high complexity decision making with multiple systems involvement.  Rounds were done with the Respiratory Therapy Director and Staff therapists and discussed with nursing staff also.  Time 35 minutes  Allyne Gee, MD [facility] Pulmonary Critical Care Medicine Sleep Medicine

## 2019-09-04 LAB — CBC
HCT: 31 % — ABNORMAL LOW (ref 39.0–52.0)
Hemoglobin: 9.3 g/dL — ABNORMAL LOW (ref 13.0–17.0)
MCH: 29.6 pg (ref 26.0–34.0)
MCHC: 30 g/dL (ref 30.0–36.0)
MCV: 98.7 fL (ref 80.0–100.0)
Platelets: 143 10*3/uL — ABNORMAL LOW (ref 150–400)
RBC: 3.14 MIL/uL — ABNORMAL LOW (ref 4.22–5.81)
RDW: 14.9 % (ref 11.5–15.5)
WBC: 7.9 10*3/uL (ref 4.0–10.5)
nRBC: 0 % (ref 0.0–0.2)

## 2019-09-04 LAB — BASIC METABOLIC PANEL
Anion gap: 7 (ref 5–15)
BUN: 20 mg/dL (ref 8–23)
CO2: 41 mmol/L — ABNORMAL HIGH (ref 22–32)
Calcium: 9 mg/dL (ref 8.9–10.3)
Chloride: 90 mmol/L — ABNORMAL LOW (ref 98–111)
Creatinine, Ser: <0.3 mg/dL — ABNORMAL LOW (ref 0.61–1.24)
Glucose, Bld: 159 mg/dL — ABNORMAL HIGH (ref 70–99)
Potassium: 3.9 mmol/L (ref 3.5–5.1)
Sodium: 138 mmol/L (ref 135–145)

## 2019-09-04 LAB — MAGNESIUM: Magnesium: 1.8 mg/dL (ref 1.7–2.4)

## 2019-09-04 NOTE — Progress Notes (Signed)
Pulmonary Critical Care Medicine [place]   PULMONARY CRITICAL CARE SERVICE  PROGRESS NOTE  Date of Service: 09/04/2019  Steve [name]  UDJ:[id number]  DOB: Jul 20, 1955   DOA: 08/28/2019  Referring Physician: Merton Border, MD  HPI: Steve [name] is a 64 y.o. male seen for follow up of Acute on Chronic Respiratory Failure.  Patient currently is on pressure control and is requiring 50% FiO2.  Patient's IP is 28.  Respiratory therapy reports that they did try spontaneous breathing trial which he did not tolerate.  Once again they will reassess later today  Medications: Reviewed on Rounds  Physical Exam:  Vitals: Temperature is 97.1 pulse 92 respiratory rate 28 blood pressure is 107/62 saturations 97%  Ventilator Settings on pressure assist control FiO2 50% tidal volumes 5.  IP 20 PEEP 5  . General: Comfortable at this time . Eyes: Grossly normal lids, irises & conjunctiva . ENT: grossly tongue is normal . Neck: no obvious mass . Cardiovascular: S1 S2 normal no gallop . Respiratory: No rhonchi no rales are noted at this time . Abdomen: soft . Skin: no rash seen on limited exam . Musculoskeletal: not rigid . Psychiatric:unable to assess . Neurologic: no seizure no involuntary movements         Lab Data:   Basic Metabolic Panel: Recent Labs  Lab 08/29/19 0749 08/30/19 0535 09/04/19 0742  NA 139  --  138  K 4.7  --  3.9  CL 93*  --  90*  CO2 38*  --  41*  GLUCOSE 145*  --  159*  BUN 14  --  20  CREATININE <0.30*  --  <0.30*  CALCIUM 8.8*  --  9.0  MG 1.7 2.0 1.8  PHOS 3.3  --   --     ABG: Recent Labs  Lab 08/28/19 2156 08/29/19 0538  PHART 7.310* 7.388  PCO2ART 84.7* 70.3*  PO2ART 117* 68.4*  HCO3 41.4* 41.4*  O2SAT 98.4 93.2    Liver Function Tests: Recent Labs  Lab 08/29/19 0749  AST 21  ALT 23  ALKPHOS 69  BILITOT 0.3  PROT 6.3*  ALBUMIN 2.2*   No results for input(s): LIPASE, AMYLASE in the last 168 hours. No  results for input(s): AMMONIA in the last 168 hours.  CBC: Recent Labs  Lab 08/29/19 0749 09/04/19 0742  WBC 9.0 7.9  NEUTROABS 5.8  --   HGB 9.2* 9.3*  HCT 31.1* 31.0*  MCV 100.3* 98.7  PLT 201 143*    Cardiac Enzymes: No results for input(s): CKTOTAL, CKMB, CKMBINDEX, TROPONINI in the last 168 hours.  BNP (last 3 results) Recent Labs    07/08/19 1815 07/11/19 0212 07/14/19 0458  BNP 407.1* 224.8* 55.9    ProBNP (last 3 results) No results for input(s): PROBNP in the last 8760 hours.  Radiological Exams: No results found.  Assessment/Plan Active Problems:   Acute on chronic respiratory failure with hypoxia ([name])   Acute respiratory distress syndrome (ARDS) due to COVID-19 virus ([name])   Hodgkin's lymphoma ([name])   Pneumothorax, acute   Healthcare-associated pneumonia   COVID-19 virus infection   1. Acute on chronic respiratory failure hypoxia we will continue with pressure control currently on 50% FiO2 IP 28 PEEP 5 2. ARDS treated we will continue to follow 3. Hodgkin's lymphoma follow-up after discharge 4. Healthcare associated pneumonia has been treated we will monitor radiologically. 5. COVID-19 virus infection resolved   I have personally seen and evaluated the patient, evaluated  laboratory and imaging results, formulated the assessment and plan and placed orders. The Patient requires high complexity decision making with multiple systems involvement.  Rounds were done with the Respiratory Therapy Director and Staff therapists and discussed with nursing staff also.  Allyne Gee, MD [facility] Pulmonary Critical Care Medicine Sleep Medicine

## 2019-09-05 NOTE — Progress Notes (Signed)
Pulmonary Critical Care Medicine [place]   PULMONARY CRITICAL CARE SERVICE  PROGRESS NOTE  Date of Service: 09/05/2019  Steve [name]  WUJ:[id number]  DOB: 07-Nov-1955   DOA: 08/28/2019  Referring Physician: Merton Border, MD  HPI: Steve [name] is a 64 y.o. male seen for follow up of Acute on Chronic Respiratory Failure.  Patient currently is on full support on pressure control mode has been on 50% FiO2 with good saturations  Medications: Reviewed on Rounds  Physical Exam:  Vitals: Temperature is 97.5 pulse 84 respiratory rate 27 blood pressure is 103/63 saturations 98%  Ventilator Settings on pressure assist control FiO2 50% tidal volume 421 PEEP 5 IP 28  . General: Comfortable at this time . Eyes: Grossly normal lids, irises & conjunctiva . ENT: grossly tongue is normal . Neck: no obvious mass . Cardiovascular: S1 S2 normal no gallop . Respiratory: No rhonchi coarse breath sounds are noted . Abdomen: soft . Skin: no rash seen on limited exam . Musculoskeletal: not rigid . Psychiatric:unable to assess . Neurologic: no seizure no involuntary movements         Lab Data:   Basic Metabolic Panel: Recent Labs  Lab 08/30/19 0535 09/04/19 0742  NA  --  138  K  --  3.9  CL  --  90*  CO2  --  41*  GLUCOSE  --  159*  BUN  --  20  CREATININE  --  <0.30*  CALCIUM  --  9.0  MG 2.0 1.8    ABG: No results for input(s): PHART, PCO2ART, PO2ART, HCO3, O2SAT in the last 168 hours.  Liver Function Tests: No results for input(s): AST, ALT, ALKPHOS, BILITOT, PROT, ALBUMIN in the last 168 hours. No results for input(s): LIPASE, AMYLASE in the last 168 hours. No results for input(s): AMMONIA in the last 168 hours.  CBC: Recent Labs  Lab 09/04/19 0742  WBC 7.9  HGB 9.3*  HCT 31.0*  MCV 98.7  PLT 143*    Cardiac Enzymes: No results for input(s): CKTOTAL, CKMB, CKMBINDEX, TROPONINI in the last 168 hours.  BNP (last 3 results) Recent Labs     07/08/19 1815 07/11/19 0212 07/14/19 0458  BNP 407.1* 224.8* 55.9    ProBNP (last 3 results) No results for input(s): PROBNP in the last 8760 hours.  Radiological Exams: No results found.  Assessment/Plan Active Problems:   Acute on chronic respiratory failure with hypoxia ([name])   Acute respiratory distress syndrome (ARDS) due to COVID-19 virus ([name])   Hodgkin's lymphoma ([name])   Pneumothorax, acute   Healthcare-associated pneumonia   COVID-19 virus infection   1. Acute on chronic respiratory failure with hypoxia we will continue with the ventilator and full support assist control mode.  Patient was attempted on R SBI in did not tolerate spontaneous breathing trial. 2. ARDS treated clinically is improving we will continue to monitor 3. Hodgkin's lymphoma patients at baseline 4. Pneumothorax no change 5. Healthcare associated pneumonia treated improved 6. COVID-19 virus infection recovery   I have personally seen and evaluated the patient, evaluated laboratory and imaging results, formulated the assessment and plan and placed orders. The Patient requires high complexity decision making with multiple systems involvement.  Rounds were done with the Respiratory Therapy Director and Staff therapists and discussed with nursing staff also.  Allyne Gee, MD [facility] Pulmonary Critical Care Medicine Sleep Medicine

## 2019-09-06 ENCOUNTER — Other Ambulatory Visit (HOSPITAL_COMMUNITY): Payer: BC Managed Care – PPO

## 2019-09-06 NOTE — Progress Notes (Addendum)
Pulmonary Critical Care Medicine [place]   PULMONARY CRITICAL CARE SERVICE  PROGRESS NOTE  Date of Service: 09/06/2019  Steve [name]  CVE:[id number]  DOB: May 13, 1955   DOA: 08/28/2019  Referring Physician: Merton Border, MD  HPI: Steve [name] is a 64 y.o. male seen for follow up of Acute on Chronic Respiratory Failure.  Patient remains on full support on ventilator at this time currently requiring 45% FiO2 satting well with no distress.  Medications: Reviewed on Rounds  Physical Exam:  Vitals: Pulse 90 respirations 36 BP 98/58 O2 sat 92% temp 97.7  Ventilator Settings ventilator mode AC PC rate of 24 expiratory pressure of 28 PEEP of 5 and FiO2 45%  . General: Comfortable at this time . Eyes: Grossly normal lids, irises & conjunctiva . ENT: grossly tongue is normal . Neck: no obvious mass . Cardiovascular: S1 S2 normal no gallop . Respiratory: No rales or rhonchi noted . Abdomen: soft . Skin: no rash seen on limited exam . Musculoskeletal: not rigid . Psychiatric:unable to assess . Neurologic: no seizure no involuntary movements         Lab Data:   Basic Metabolic Panel: Recent Labs  Lab 09/04/19 0742  NA 138  K 3.9  CL 90*  CO2 41*  GLUCOSE 159*  BUN 20  CREATININE <0.30*  CALCIUM 9.0  MG 1.8    ABG: No results for input(s): PHART, PCO2ART, PO2ART, HCO3, O2SAT in the last 168 hours.  Liver Function Tests: No results for input(s): AST, ALT, ALKPHOS, BILITOT, PROT, ALBUMIN in the last 168 hours. No results for input(s): LIPASE, AMYLASE in the last 168 hours. No results for input(s): AMMONIA in the last 168 hours.  CBC: Recent Labs  Lab 09/04/19 0742  WBC 7.9  HGB 9.3*  HCT 31.0*  MCV 98.7  PLT 143*    Cardiac Enzymes: No results for input(s): CKTOTAL, CKMB, CKMBINDEX, TROPONINI in the last 168 hours.  BNP (last 3 results) Recent Labs    07/08/19 1815 07/11/19 0212 07/14/19 0458  BNP 407.1* 224.8* 55.9     ProBNP (last 3 results) No results for input(s): PROBNP in the last 8760 hours.  Radiological Exams: DG CHEST PORT 1 VIEW  Result Date: 09/06/2019 CLINICAL DATA:  Hypoxemia, LEFT side chest tube placement for pneumothorax EXAM: PORTABLE CHEST 1 VIEW COMPARISON:  Portable exam 1143 hours compared to 09/02/2019 and correlated with the prior study of 05/21/2019 FINDINGS: Tracheostomy tube, LEFT thoracostomy tube, and LEFT jugular Port-A-Cath unchanged. Stable heart size mediastinal contours. BILATERAL pulmonary infiltrates, representing a combination of underlying chronic interstitial lung disease with superimposed pulmonary infiltrates. No pleural effusion or pneumothorax. Bones demineralized. IMPRESSION: BILATERAL pulmonary infiltrates question multifocal pneumonia superimposed upon a background of chronic interstitial lung disease/pulmonary fibrosis. Electronically Signed   By: Lavonia Dana M.D.   On: 09/06/2019 13:38    Assessment/Plan Active Problems:   Acute on chronic respiratory failure with hypoxia ([name])   Acute respiratory distress syndrome (ARDS) due to COVID-19 virus ([name])   Hodgkin's lymphoma ([name])   Pneumothorax, acute   Healthcare-associated pneumonia   COVID-19 virus infection   1. Acute on chronic respiratory failure with hypoxia we will continue with the ventilator and full support assist control mode.  Continue aggressive pulmonary toilet and supportive measures. 2. ARDS treated clinically is improving we will continue to monitor 3. Hodgkin's lymphoma patients at baseline 4. Pneumothorax no change 5. Healthcare associated pneumonia treated improved 6. COVID-19 virus infection recovery   I  have personally seen and evaluated the patient, evaluated laboratory and imaging results, formulated the assessment and plan and placed orders. The Patient requires high complexity decision making with multiple systems involvement.  Rounds were done with the Respiratory Therapy  Director and Staff therapists and discussed with nursing staff also.  Allyne Gee, MD [facility] Pulmonary Critical Care Medicine Sleep Medicine

## 2019-09-07 LAB — CBC
HCT: 31.5 % — ABNORMAL LOW (ref 39.0–52.0)
Hemoglobin: 9.5 g/dL — ABNORMAL LOW (ref 13.0–17.0)
MCH: 29.6 pg (ref 26.0–34.0)
MCHC: 30.2 g/dL (ref 30.0–36.0)
MCV: 98.1 fL (ref 80.0–100.0)
Platelets: 141 10*3/uL — ABNORMAL LOW (ref 150–400)
RBC: 3.21 MIL/uL — ABNORMAL LOW (ref 4.22–5.81)
RDW: 14.6 % (ref 11.5–15.5)
WBC: 7.7 10*3/uL (ref 4.0–10.5)
nRBC: 0 % (ref 0.0–0.2)

## 2019-09-07 LAB — BASIC METABOLIC PANEL
Anion gap: 14 (ref 5–15)
BUN: 15 mg/dL (ref 8–23)
CO2: 38 mmol/L — ABNORMAL HIGH (ref 22–32)
Calcium: 9.3 mg/dL (ref 8.9–10.3)
Chloride: 89 mmol/L — ABNORMAL LOW (ref 98–111)
Creatinine, Ser: <0.3 mg/dL — ABNORMAL LOW (ref 0.61–1.24)
Glucose, Bld: 152 mg/dL — ABNORMAL HIGH (ref 70–99)
Potassium: 4.6 mmol/L (ref 3.5–5.1)
Sodium: 141 mmol/L (ref 135–145)

## 2019-09-07 LAB — MAGNESIUM: Magnesium: 1.8 mg/dL (ref 1.7–2.4)

## 2019-09-07 NOTE — Progress Notes (Signed)
Pulmonary Critical Care Medicine [place]   PULMONARY CRITICAL CARE SERVICE  PROGRESS NOTE  Date of Service: 09/07/2019  Steve [name]  PRF:[id number]  DOB: [birth year]   DOA: 08/28/2019  Referring Physician: Merton Border, MD  HPI: Steve [name] is a 64 y.o. male seen for follow up of Acute on Chronic Respiratory Failure.  Patient is on the ventilator continues to fail weaning attempts.  Right now is on pressure control mode on full support and 45% FiO2  Medications: Reviewed on Rounds  Physical Exam:  Vitals: Temperature 97.4 pulse 99 respiratory 34 blood pressure is 133/81 saturations 98%  Ventilator Settings on pressure assist control FiO2 45% tidal volume 07/21/1926 PEEP 5  . General: Comfortable at this time . Eyes: Grossly normal lids, irises & conjunctiva . ENT: grossly tongue is normal . Neck: no obvious mass . Cardiovascular: S1 S2 normal no gallop . Respiratory: No rhonchi no rales are noted at this time . Abdomen: soft . Skin: no rash seen on limited exam . Musculoskeletal: not rigid . Psychiatric:unable to assess . Neurologic: no seizure no involuntary movements         Lab Data:   Basic Metabolic Panel: Recent Labs  Lab 09/04/19 0742 09/07/19 1032  NA 138 141  K 3.9 4.6  CL 90* 89*  CO2 41* 38*  GLUCOSE 159* 152*  BUN 20 15  CREATININE <0.30* <0.30*  CALCIUM 9.0 9.3  MG 1.8 1.8    ABG: No results for input(s): PHART, PCO2ART, PO2ART, HCO3, O2SAT in the last 168 hours.  Liver Function Tests: No results for input(s): AST, ALT, ALKPHOS, BILITOT, PROT, ALBUMIN in the last 168 hours. No results for input(s): LIPASE, AMYLASE in the last 168 hours. No results for input(s): AMMONIA in the last 168 hours.  CBC: Recent Labs  Lab 09/04/19 0742 09/07/19 1032  WBC 7.9 7.7  HGB 9.3* 9.5*  HCT 31.0* 31.5*  MCV 98.7 98.1  PLT 143* 141*    Cardiac Enzymes: No results for input(s): CKTOTAL, CKMB, CKMBINDEX, TROPONINI in  the last 168 hours.  BNP (last 3 results) Recent Labs    07/08/19 1815 07/11/19 0212 07/14/19 0458  BNP 407.1* 224.8* 55.9    ProBNP (last 3 results) No results for input(s): PROBNP in the last 8760 hours.  Radiological Exams: DG CHEST PORT 1 VIEW  Result Date: 09/06/2019 CLINICAL DATA:  Hypoxemia, LEFT side chest tube placement for pneumothorax EXAM: PORTABLE CHEST 1 VIEW COMPARISON:  Portable exam 1143 hours compared to 09/02/2019 and correlated with the prior study of 05/21/2019 FINDINGS: Tracheostomy tube, LEFT thoracostomy tube, and LEFT jugular Port-A-Cath unchanged. Stable heart size mediastinal contours. BILATERAL pulmonary infiltrates, representing a combination of underlying chronic interstitial lung disease with superimposed pulmonary infiltrates. No pleural effusion or pneumothorax. Bones demineralized. IMPRESSION: BILATERAL pulmonary infiltrates question multifocal pneumonia superimposed upon a background of chronic interstitial lung disease/pulmonary fibrosis. Electronically Signed   By: Lavonia Dana M.D.   On: 09/06/2019 13:38    Assessment/Plan Active Problems:   Acute on chronic respiratory failure with hypoxia ([name])   Acute respiratory distress syndrome (ARDS) due to COVID-19 virus ([name])   Hodgkin's lymphoma ([name])   Pneumothorax, acute   Healthcare-associated pneumonia   COVID-19 virus infection   1. Acute on chronic respiratory failure with hypoxia we will continue with full support on the ventilator patient has been weaning.  Patient had a CT scan done which shows infiltrates with chronic interstitial changes likely related to the Covid.  New onset chest x-ray showed elevated right hemidiaphragm consider a sniff test 2. Allergies lymphoma right now appears to be in relation we will continue with supportive care 3. ARDS slow resolution 4. Acute pneumothorax resolved we will continue with supportive care no active pneumothorax was noted 5. Healthcare associated  pneumonia patient still has significant infiltrate noted 6. COVID-19 in resolution phase   I have personally seen and evaluated the patient, evaluated laboratory and imaging results, formulated the assessment and plan and placed orders. The Patient requires high complexity decision making with multiple systems involvement.  Rounds were done with the Respiratory Therapy Director and Staff therapists and discussed with nursing staff also.  Allyne Gee, MD [facility] Pulmonary Critical Care Medicine Sleep Medicine

## 2019-09-08 NOTE — Progress Notes (Signed)
Pulmonary Critical Care Medicine [place]   PULMONARY CRITICAL CARE SERVICE  PROGRESS NOTE  Date of Service: 09/08/2019  Steve [name]  DXA:[id number]  DOB: 1956/03/19   DOA: 08/28/2019  Referring Physician: Merton Border, MD  HPI: Steve [name] is a 64 y.o. male seen for follow up of Acute on Chronic Respiratory Failure.  Patient currently is on full support on the ventilator.  Was attempted at weaning however patient did not tolerate the weaning attempt.  Medications: Reviewed on Rounds  Physical Exam:  Vitals: Temperature is 97.1 pulse 110 respiratory rate 24 blood pressure is 120/54 saturations 95%  Ventilator Settings on pressures assist control FiO2 45% tidal volume 384 PEEP 5  . General: Comfortable at this time . Eyes: Grossly normal lids, irises & conjunctiva . ENT: grossly tongue is normal . Neck: no obvious mass . Cardiovascular: S1 S2 normal no gallop . Respiratory: No rhonchi coarse breath sounds . Abdomen: soft . Skin: no rash seen on limited exam . Musculoskeletal: not rigid . Psychiatric:unable to assess . Neurologic: no seizure no involuntary movements         Lab Data:   Basic Metabolic Panel: Recent Labs  Lab 09/04/19 0742 09/07/19 1032  NA 138 141  K 3.9 4.6  CL 90* 89*  CO2 41* 38*  GLUCOSE 159* 152*  BUN 20 15  CREATININE <0.30* <0.30*  CALCIUM 9.0 9.3  MG 1.8 1.8    ABG: No results for input(s): PHART, PCO2ART, PO2ART, HCO3, O2SAT in the last 168 hours.  Liver Function Tests: No results for input(s): AST, ALT, ALKPHOS, BILITOT, PROT, ALBUMIN in the last 168 hours. No results for input(s): LIPASE, AMYLASE in the last 168 hours. No results for input(s): AMMONIA in the last 168 hours.  CBC: Recent Labs  Lab 09/04/19 0742 09/07/19 1032  WBC 7.9 7.7  HGB 9.3* 9.5*  HCT 31.0* 31.5*  MCV 98.7 98.1  PLT 143* 141*    Cardiac Enzymes: No results for input(s): CKTOTAL, CKMB, CKMBINDEX, TROPONINI in the  last 168 hours.  BNP (last 3 results) Recent Labs    07/08/19 1815 07/11/19 0212 07/14/19 0458  BNP 407.1* 224.8* 55.9    ProBNP (last 3 results) No results for input(s): PROBNP in the last 8760 hours.  Radiological Exams: No results found.  Assessment/Plan Active Problems:   Acute on chronic respiratory failure with hypoxia ([name])   Acute respiratory distress syndrome (ARDS) due to COVID-19 virus ([name])   Hodgkin's lymphoma ([name])   Pneumothorax, acute   Healthcare-associated pneumonia   COVID-19 virus infection   1. Acute on chronic respiratory failure with hypoxia plan is to continue with making weaning attempts so far patient has not been successful at weaning 2. ARDS slow improvement we will continue to monitor 3. Hodgkin's lymphoma supportive care 4. Acute pneumothorax patient has chest tube in place 5. Healthcare associated pneumonia still with significant infiltrates noted on chest films could also be possibly related to treatment for the underlying lymphoma 6. COVID-19 virus infection resolved   I have personally seen and evaluated the patient, evaluated laboratory and imaging results, formulated the assessment and plan and placed orders. The Patient requires high complexity decision making with multiple systems involvement.  Rounds were done with the Respiratory Therapy Director and Staff therapists and discussed with nursing staff also.  Allyne Gee, MD [facility] Pulmonary Critical Care Medicine Sleep Medicine

## 2019-09-09 NOTE — Progress Notes (Signed)
Pulmonary Critical Care Medicine [place]   PULMONARY CRITICAL CARE SERVICE  PROGRESS NOTE  Date of Service: 09/09/2019  Steve [name]  YPP:[id number]  DOB: 1956-04-07   DOA: 08/28/2019  Referring Physician: Merton Border, MD  HPI: Steve [name] is a 64 y.o. male seen for follow up of Acute on Chronic Respiratory Failure.  We are continuing to attempt weaning on pressure support patient's not tolerated.  Continues to have increased work of breathing noted.  Switched over to pressure support right now is on pressure control and 45% FiO2 with good volumes  Medications: Reviewed on Rounds  Physical Exam:  Vitals: Temperature 97.0 pulse 80 respiratory rate 30 blood pressure is 112/76 saturations 97%  Ventilator Settings on pressure control FiO2 45% tidal volumes 328 PEEP 5 IP 28  . General: Comfortable at this time . Eyes: Grossly normal lids, irises & conjunctiva . ENT: grossly tongue is normal . Neck: no obvious mass . Cardiovascular: S1 S2 normal no gallop . Respiratory: Scattered rhonchi expansion is equal . Abdomen: soft . Skin: no rash seen on limited exam . Musculoskeletal: not rigid . Psychiatric:unable to assess . Neurologic: no seizure no involuntary movements         Lab Data:   Basic Metabolic Panel: Recent Labs  Lab 09/04/19 0742 09/07/19 1032  NA 138 141  K 3.9 4.6  CL 90* 89*  CO2 41* 38*  GLUCOSE 159* 152*  BUN 20 15  CREATININE <0.30* <0.30*  CALCIUM 9.0 9.3  MG 1.8 1.8    ABG: No results for input(s): PHART, PCO2ART, PO2ART, HCO3, O2SAT in the last 168 hours.  Liver Function Tests: No results for input(s): AST, ALT, ALKPHOS, BILITOT, PROT, ALBUMIN in the last 168 hours. No results for input(s): LIPASE, AMYLASE in the last 168 hours. No results for input(s): AMMONIA in the last 168 hours.  CBC: Recent Labs  Lab 09/04/19 0742 09/07/19 1032  WBC 7.9 7.7  HGB 9.3* 9.5*  HCT 31.0* 31.5*  MCV 98.7 98.1  PLT 143*  141*    Cardiac Enzymes: No results for input(s): CKTOTAL, CKMB, CKMBINDEX, TROPONINI in the last 168 hours.  BNP (last 3 results) Recent Labs    07/08/19 1815 07/11/19 0212 07/14/19 0458  BNP 407.1* 224.8* 55.9    ProBNP (last 3 results) No results for input(s): PROBNP in the last 8760 hours.  Radiological Exams: No results found.  Assessment/Plan Active Problems:   Acute on chronic respiratory failure with hypoxia ([name])   Acute respiratory distress syndrome (ARDS) due to COVID-19 virus ([name])   Hodgkin's lymphoma ([name])   Pneumothorax, acute   Healthcare-associated pneumonia   COVID-19 virus infection   1. Acute on chronic respiratory failure hypoxia we will continue with pressure control titrate oxygen continue pulmonary toilet. 2. ARDS treated clinically improved 3. Hodgkin's lymphoma no change continue supportive care 4. Acute pneumothorax patient is at baseline we will continue to follow 5. healthcare associated pneumonia treated 6. COVID-19 virus infection baseline in resolution phase   I have personally seen and evaluated the patient, evaluated laboratory and imaging results, formulated the assessment and plan and placed orders. The Patient requires high complexity decision making with multiple systems involvement.  Rounds were done with the Respiratory Therapy Director and Staff therapists and discussed with nursing staff also.  Allyne Gee, MD [facility] Pulmonary Critical Care Medicine Sleep Medicine

## 2019-09-10 ENCOUNTER — Other Ambulatory Visit (HOSPITAL_COMMUNITY): Payer: BC Managed Care – PPO

## 2019-09-10 LAB — BASIC METABOLIC PANEL
Anion gap: 12 (ref 5–15)
BUN: 15 mg/dL (ref 8–23)
CO2: 42 mmol/L — ABNORMAL HIGH (ref 22–32)
Calcium: 9.6 mg/dL (ref 8.9–10.3)
Chloride: 86 mmol/L — ABNORMAL LOW (ref 98–111)
Creatinine, Ser: <0.3 mg/dL — ABNORMAL LOW (ref 0.61–1.24)
Glucose, Bld: 155 mg/dL — ABNORMAL HIGH (ref 70–99)
Potassium: 4.4 mmol/L (ref 3.5–5.1)
Sodium: 140 mmol/L (ref 135–145)

## 2019-09-10 LAB — CBC
HCT: 31.5 % — ABNORMAL LOW (ref 39.0–52.0)
Hemoglobin: 9.5 g/dL — ABNORMAL LOW (ref 13.0–17.0)
MCH: 29.8 pg (ref 26.0–34.0)
MCHC: 30.2 g/dL (ref 30.0–36.0)
MCV: 98.7 fL (ref 80.0–100.0)
Platelets: 166 10*3/uL (ref 150–400)
RBC: 3.19 MIL/uL — ABNORMAL LOW (ref 4.22–5.81)
RDW: 15 % (ref 11.5–15.5)
WBC: 7.6 10*3/uL (ref 4.0–10.5)
nRBC: 0 % (ref 0.0–0.2)

## 2019-09-10 LAB — MAGNESIUM: Magnesium: 1.7 mg/dL (ref 1.7–2.4)

## 2019-09-10 NOTE — Progress Notes (Signed)
Pulmonary Critical Care Medicine [place]   PULMONARY CRITICAL CARE SERVICE  PROGRESS NOTE  Date of Service: 09/10/2019  Steve [name]  JGG:[id number]  DOB: 1955/10/18   DOA: 08/28/2019  Referring Physician: Merton Border, MD  HPI: Steve [name] is a 64 y.o. male seen for follow up of Acute on Chronic Respiratory Failure.  Patient is on full support on pressure control mode currently on 45% FiO2 with tidal volume of 393 IP is 28 PEEP of 5  Medications: Reviewed on Rounds  Physical Exam:  Vitals: Temperature 97.0 pulse 84 respiratory 20 blood pressure is 113/79 saturations 92%  Ventilator Settings on pressure assist control FiO2 45% tidal volume 383 IP 28 PEEP 5  . General: Comfortable at this time . Eyes: Grossly normal lids, irises & conjunctiva . ENT: grossly tongue is normal . Neck: no obvious mass . Cardiovascular: S1 S2 normal no gallop . Respiratory: Coarse breath sounds bilaterally . Abdomen: soft . Skin: no rash seen on limited exam . Musculoskeletal: not rigid . Psychiatric:unable to assess . Neurologic: no seizure no involuntary movements         Lab Data:   Basic Metabolic Panel: Recent Labs  Lab 09/04/19 0742 09/07/19 1032 09/10/19 0743  NA 138 141 140  K 3.9 4.6 4.4  CL 90* 89* 86*  CO2 41* 38* 42*  GLUCOSE 159* 152* 155*  BUN 20 15 15   CREATININE <0.30* <0.30* <0.30*  CALCIUM 9.0 9.3 9.6  MG 1.8 1.8 1.7    ABG: No results for input(s): PHART, PCO2ART, PO2ART, HCO3, O2SAT in the last 168 hours.  Liver Function Tests: No results for input(s): AST, ALT, ALKPHOS, BILITOT, PROT, ALBUMIN in the last 168 hours. No results for input(s): LIPASE, AMYLASE in the last 168 hours. No results for input(s): AMMONIA in the last 168 hours.  CBC: Recent Labs  Lab 09/04/19 0742 09/07/19 1032 09/10/19 0743  WBC 7.9 7.7 7.6  HGB 9.3* 9.5* 9.5*  HCT 31.0* 31.5* 31.5*  MCV 98.7 98.1 98.7  PLT 143* 141* 166    Cardiac  Enzymes: No results for input(s): CKTOTAL, CKMB, CKMBINDEX, TROPONINI in the last 168 hours.  BNP (last 3 results) Recent Labs    07/08/19 1815 07/11/19 0212 07/14/19 0458  BNP 407.1* 224.8* 55.9    ProBNP (last 3 results) No results for input(s): PROBNP in the last 8760 hours.  Radiological Exams: DG CHEST PORT 1 VIEW  Result Date: 09/10/2019 CLINICAL DATA:  Pneumonia. EXAM: PORTABLE CHEST 1 VIEW COMPARISON:  09/06/2019.  07/31/2019.  CT 08/20/2019. FINDINGS: Tracheostomy tube, PowerPort catheter, left chest tube in stable position. No pneumothorax. Stable cardiomegaly. Diffuse severe bilateral pulmonary interstitial prominence again noted. Similar findings noted on prior exam. Again chronic interstitial lung disease with possible superimposed active interstitial process could present in this fashion. No pleural effusion or pneumothorax. IMPRESSION: 1. Lines and tubes including left chest tube in stable position. No pneumothorax. 2. Diffuse severe bilateral pulmonary interstitial prominence again noted. Similar findings noted on prior exams. Again chronic interstitial lung disease with possible superimposed active interstitial process could present this fashion. Electronically Signed   By: Marcello Moores  Register   On: 09/10/2019 06:36    Assessment/Plan Active Problems:   Acute on chronic respiratory failure with hypoxia ([name])   Acute respiratory distress syndrome (ARDS) due to COVID-19 virus ([name])   Hodgkin's lymphoma ([name])   Pneumothorax, acute   Healthcare-associated pneumonia   COVID-19 virus infection   1. Acute on chronic respiratory  failure hypoxia respiratory therapy will attempt the spontaneous breathing trial morning once again.  Patient has not been tolerating it over the last couple of days.  Since chest x-ray still showing diffuse bilateral infiltrates. 2. ARDS chest x-ray still abnormal, continue to follow along 3. Hodgkin's lymphoma status post therapy supportive care  prognosis guarded 4. Healthcare associated pneumonia treated clinically is slow to improve 5. COVID-19 virus infection in resolution phase   I have personally seen and evaluated the patient, evaluated laboratory and imaging results, formulated the assessment and plan and placed orders. The Patient requires high complexity decision making with multiple systems involvement.  Rounds were done with the Respiratory Therapy Director and Staff therapists and discussed with nursing staff also.  Allyne Gee, MD [facility] Pulmonary Critical Care Medicine Sleep Medicine

## 2019-09-11 LAB — MAGNESIUM: Magnesium: 1.9 mg/dL (ref 1.7–2.4)

## 2019-09-11 NOTE — Progress Notes (Addendum)
Pulmonary Critical Care Medicine [place]   PULMONARY CRITICAL CARE SERVICE  PROGRESS NOTE  Date of Service: 09/11/2019  ARLEN DUPUIS  ZHY:[id number]  DOB: [birth year]   DOA: 08/28/2019  Referring Physician: Merton Border, MD  HPI: JAKEVION ARNEY is a 64 y.o. male seen for follow up of Acute on Chronic Respiratory Failure.  Patient remains on full support on the ventilator became tachypneic today with respiratory rate in the upper 30s.  Currently on control mode rate 24 with an FiO2 of 50% satting well currently.  Medications: Reviewed on Rounds  Physical Exam:  Vitals: Pulse 98 respirations 36 BP 127/67 O2 sat 98% temp 97.9  Ventilator Settings ventilator mode AC PC rate of 24 expiratory pressure of 28 PEEP of 5 and FiO2 50%  . General: Comfortable at this time . Eyes: Grossly normal lids, irises & conjunctiva . ENT: grossly tongue is normal . Neck: no obvious mass . Cardiovascular: S1 S2 normal no gallop . Respiratory: No rales or rhonchi noted . Abdomen: soft . Skin: no rash seen on limited exam . Musculoskeletal: not rigid . Psychiatric:unable to assess . Neurologic: no seizure no involuntary movements         Lab Data:   Basic Metabolic Panel: Recent Labs  Lab 09/07/19 1032 09/10/19 0743 09/11/19 0803  NA 141 140  --   K 4.6 4.4  --   CL 89* 86*  --   CO2 38* 42*  --   GLUCOSE 152* 155*  --   BUN 15 15  --   CREATININE <0.30* <0.30*  --   CALCIUM 9.3 9.6  --   MG 1.8 1.7 1.9    ABG: No results for input(s): PHART, PCO2ART, PO2ART, HCO3, O2SAT in the last 168 hours.  Liver Function Tests: No results for input(s): AST, ALT, ALKPHOS, BILITOT, PROT, ALBUMIN in the last 168 hours. No results for input(s): LIPASE, AMYLASE in the last 168 hours. No results for input(s): AMMONIA in the last 168 hours.  CBC: Recent Labs  Lab 09/07/19 1032 09/10/19 0743  WBC 7.7 7.6  HGB 9.5* 9.5*  HCT 31.5* 31.5*  MCV 98.1 98.7  PLT 141* 166     Cardiac Enzymes: No results for input(s): CKTOTAL, CKMB, CKMBINDEX, TROPONINI in the last 168 hours.  BNP (last 3 results) Recent Labs    07/08/19 1815 07/11/19 0212 07/14/19 0458  BNP 407.1* 224.8* 55.9    ProBNP (last 3 results) No results for input(s): PROBNP in the last 8760 hours.  Radiological Exams: DG CHEST PORT 1 VIEW  Result Date: 09/10/2019 CLINICAL DATA:  Pneumonia. EXAM: PORTABLE CHEST 1 VIEW COMPARISON:  09/06/2019.  07/31/2019.  CT 08/20/2019. FINDINGS: Tracheostomy tube, PowerPort catheter, left chest tube in stable position. No pneumothorax. Stable cardiomegaly. Diffuse severe bilateral pulmonary interstitial prominence again noted. Similar findings noted on prior exam. Again chronic interstitial lung disease with possible superimposed active interstitial process could present in this fashion. No pleural effusion or pneumothorax. IMPRESSION: 1. Lines and tubes including left chest tube in stable position. No pneumothorax. 2. Diffuse severe bilateral pulmonary interstitial prominence again noted. Similar findings noted on prior exams. Again chronic interstitial lung disease with possible superimposed active interstitial process could present this fashion. Electronically Signed   By: Marcello Moores  Register   On: 09/10/2019 06:36    Assessment/Plan Active Problems:   Acute on chronic respiratory failure with hypoxia ([name])   Acute respiratory distress syndrome (ARDS) due to COVID-19 virus ([place])   Hodgkin's  lymphoma ([place])   Pneumothorax, acute   Healthcare-associated pneumonia   COVID-19 virus infection   1. Acute on chronic respiratory failure hypoxia patient mains on full support we will continue supportive measures and pulmonary toilet at this time.  Continue to wean if patient can tolerate. 2. ARDS chest x-ray still abnormal, continue to follow along 3. Hodgkin's lymphoma status post therapy supportive care prognosis guarded 4. Healthcare associated pneumonia treated  clinically is slow to improve 5. COVID-19 virus infection in resolution phase   I have personally seen and evaluated the patient, evaluated laboratory and imaging results, formulated the assessment and plan and placed orders. The Patient requires high complexity decision making with multiple systems involvement.  Rounds were done with the Respiratory Therapy Director and Staff therapists and discussed with nursing staff also.  Allyne Gee, MD [facility] Pulmonary Critical Care Medicine Sleep Medicine

## 2019-09-12 NOTE — Progress Notes (Signed)
Pulmonary Critical Care Medicine [place]   PULMONARY CRITICAL CARE SERVICE  PROGRESS NOTE  Date of Service: 09/12/2019  Steve [name]  YBO:[id number]  DOB: [birth year]   DOA: 08/28/2019  Referring Physician: Merton Border, MD  HPI: Steve [name] is a 64 y.o. male seen for follow up of Acute on Chronic Respiratory Failure.  Patient is comfortable right now without distress has been on pressure control mode on 50% FiO2 with good saturations still has not had the sniff test I am awaiting that to be done.  Medications: Reviewed on Rounds  Physical Exam:  Vitals: Temperature is 97.9 pulse 99 respiratory 35 blood pressure is 109/53 saturations 97%  Ventilator Settings on pressure control FiO2 50% tidal volume 4 5 PEEP 5  . General: Comfortable at this time . Eyes: Grossly normal lids, irises & conjunctiva . ENT: grossly tongue is normal . Neck: no obvious mass . Cardiovascular: S1 S2 normal no gallop . Respiratory: Scattered coarse breath sounds . Abdomen: soft . Skin: no rash seen on limited exam . Musculoskeletal: not rigid . Psychiatric:unable to assess . Neurologic: no seizure no involuntary movements         Lab Data:   Basic Metabolic Panel: Recent Labs  Lab 09/07/19 1032 09/10/19 0743 09/11/19 0803  NA 141 140  --   K 4.6 4.4  --   CL 89* 86*  --   CO2 38* 42*  --   GLUCOSE 152* 155*  --   BUN 15 15  --   CREATININE <0.30* <0.30*  --   CALCIUM 9.3 9.6  --   MG 1.8 1.7 1.9    ABG: No results for input(s): PHART, PCO2ART, PO2ART, HCO3, O2SAT in the last 168 hours.  Liver Function Tests: No results for input(s): AST, ALT, ALKPHOS, BILITOT, PROT, ALBUMIN in the last 168 hours. No results for input(s): LIPASE, AMYLASE in the last 168 hours. No results for input(s): AMMONIA in the last 168 hours.  CBC: Recent Labs  Lab 09/07/19 1032 09/10/19 0743  WBC 7.7 7.6  HGB 9.5* 9.5*  HCT 31.5* 31.5*  MCV 98.1 98.7  PLT 141* 166     Cardiac Enzymes: No results for input(s): CKTOTAL, CKMB, CKMBINDEX, TROPONINI in the last 168 hours.  BNP (last 3 results) Recent Labs    07/08/19 1815 07/11/19 0212 07/14/19 0458  BNP 407.1* 224.8* 55.9    ProBNP (last 3 results) No results for input(s): PROBNP in the last 8760 hours.  Radiological Exams: No results found.  Assessment/Plan Active Problems:   Acute on chronic respiratory failure with hypoxia ([name])   Acute respiratory distress syndrome (ARDS) due to COVID-19 virus ([name])   Hodgkin's lymphoma ([name])   Pneumothorax, acute   Healthcare-associated pneumonia   COVID-19 virus infection   1. Acute on chronic respiratory failure hypoxia continues to fail weaning attempts.  Spoke with primary care team about the sniff test and patient has not yet had it done I think this may provide [place] with some information regarding whether patient has any diaphragmatic paralysis 2. ARDS continue with supportive care patient has advanced disease 3. Hodgkin's lymphoma no change 4. Pneumothorax has resolved chest tube can be removed 5. Healthcare associated pneumonia treated clinically improving 6. COVID-19 virus infection in recovery phase patient has significant damage to the lungs post infection   I have personally seen and evaluated the patient, evaluated laboratory and imaging results, formulated the assessment and plan and placed orders. The Patient requires high  complexity decision making with multiple systems involvement.  Rounds were done with the Respiratory Therapy Director and Staff therapists and discussed with nursing staff also.  Time 35 minutes  Allyne Gee, MD [facility] Pulmonary Critical Care Medicine Sleep Medicine

## 2019-09-13 ENCOUNTER — Other Ambulatory Visit (HOSPITAL_COMMUNITY): Payer: BC Managed Care – PPO

## 2019-09-13 NOTE — Progress Notes (Addendum)
Pulmonary Critical Care Medicine [place]   PULMONARY CRITICAL CARE SERVICE  PROGRESS NOTE  Date of Service: 09/13/2019  Steve [name]  MHD:[id number]  DOB: 1955/10/26   DOA: 08/28/2019  Referring Physician: Merton Border, MD  HPI: Steve [name] is a 64 y.o. male seen for follow up of Acute on Chronic Respiratory Failure. Patient continues on full support on the vent.  Assist control rate of 24, and fio2 50%.    Medications: Reviewed on Rounds  Physical Exam:  Vitals: pulse 101, resp 30, bp 122/65, o2 sat 94%, temp 98.0  Ventilator Settings ACPC Rate 24, pressure control 28, peep 5, fio2 50%.    . General: Comfortable at this time . Eyes: Grossly normal lids, irises & conjunctiva . ENT: grossly tongue is normal . Neck: no obvious mass . Cardiovascular: S1 S2 normal no gallop . Respiratory: No rales or ronchi noted . Abdomen: soft . Skin: no rash seen on limited exam . Musculoskeletal: not rigid . Psychiatric:unable to assess . Neurologic: no seizure no involuntary movements         Lab Data:   Basic Metabolic Panel: Recent Labs  Lab 09/07/19 1032 09/10/19 0743 09/11/19 0803  NA 141 140  --   K 4.6 4.4  --   CL 89* 86*  --   CO2 38* 42*  --   GLUCOSE 152* 155*  --   BUN 15 15  --   CREATININE <0.30* <0.30*  --   CALCIUM 9.3 9.6  --   MG 1.8 1.7 1.9    ABG: No results for input(s): PHART, PCO2ART, PO2ART, HCO3, O2SAT in the last 168 hours.  Liver Function Tests: No results for input(s): AST, ALT, ALKPHOS, BILITOT, PROT, ALBUMIN in the last 168 hours. No results for input(s): LIPASE, AMYLASE in the last 168 hours. No results for input(s): AMMONIA in the last 168 hours.  CBC: Recent Labs  Lab 09/07/19 1032 09/10/19 0743  WBC 7.7 7.6  HGB 9.5* 9.5*  HCT 31.5* 31.5*  MCV 98.1 98.7  PLT 141* 166    Cardiac Enzymes: No results for input(s): CKTOTAL, CKMB, CKMBINDEX, TROPONINI in the last 168 hours.  BNP (last 3  results) Recent Labs    07/08/19 1815 07/11/19 0212 07/14/19 0458  BNP 407.1* 224.8* 55.9    ProBNP (last 3 results) No results for input(s): PROBNP in the last 8760 hours.  Radiological Exams: DG CHEST PORT 1 VIEW  Result Date: 09/13/2019 CLINICAL DATA:  Pneumonia EXAM: PORTABLE CHEST 1 VIEW COMPARISON:  September 10, 2019 FINDINGS: Again noted is tracheostomy tube and a left-sided central venous catheter in unchanged position. A left-sided chest tube is seen. Multifocal patchy airspace opacities are seen throughout both lungs with diffusely increased interstitial markings. Consolidation is seen. No acute osseous abnormality. IMPRESSION: Lines and tubes in unchanged position. Stable examination with diffuse bilateral interstitial and patchy airspace opacities. Electronically Signed   By: Prudencio Pair M.D.   On: 09/13/2019 06:44    Assessment/Plan Active Problems:   Acute on chronic respiratory failure with hypoxia ([name])   Acute respiratory distress syndrome (ARDS) due to COVID-19 virus ([name])   Hodgkin's lymphoma ([name])   Pneumothorax, acute   Healthcare-associated pneumonia   COVID-19 virus infection   1. Acute on chronic respiratory failure hypoxia patient will continue to rest on pressure control.  Continue current management.  2. ARDS continue with supportive care patient has advanced disease 3. Hodgkin's lymphoma no change 4. Pneumothorax has resolved chest  tube can be removed 5. Healthcare associated pneumonia treated clinically improving 6. COVID-19 virus infection in recovery phase patient has significant damage to the lungs post infection   I have personally seen and evaluated the patient, evaluated laboratory and imaging results, formulated the assessment and plan and placed orders. The Patient requires high complexity decision making with multiple systems involvement.  Rounds were done with the Respiratory Therapy Director and Staff therapists and discussed with nursing staff  also.  Allyne Gee, MD [facility] Pulmonary Critical Care Medicine Sleep Medicine

## 2019-09-14 LAB — BASIC METABOLIC PANEL
Anion gap: 5 (ref 5–15)
BUN: 38 mg/dL — ABNORMAL HIGH (ref 8–23)
CO2: 50 mmol/L — ABNORMAL HIGH (ref 22–32)
Calcium: 9.8 mg/dL (ref 8.9–10.3)
Chloride: 89 mmol/L — ABNORMAL LOW (ref 98–111)
Creatinine, Ser: 0.36 mg/dL — ABNORMAL LOW (ref 0.61–1.24)
GFR calc Af Amer: >60 mL/min (ref >60)
GFR calc non Af Amer: >60 mL/min (ref >60)
Glucose, Bld: 172 mg/dL — ABNORMAL HIGH (ref 70–99)
Potassium: 4.1 mmol/L (ref 3.5–5.1)
Sodium: 144 mmol/L (ref 135–145)

## 2019-09-14 LAB — CBC
HCT: 32.2 % — ABNORMAL LOW (ref 39.0–52.0)
Hemoglobin: 9.3 g/dL — ABNORMAL LOW (ref 13.0–17.0)
MCH: 29.2 pg (ref 26.0–34.0)
MCHC: 28.9 g/dL — ABNORMAL LOW (ref 30.0–36.0)
MCV: 101.3 fL — ABNORMAL HIGH (ref 80.0–100.0)
Platelets: 196 10*3/uL (ref 150–400)
RBC: 3.18 MIL/uL — ABNORMAL LOW (ref 4.22–5.81)
RDW: 15.3 % (ref 11.5–15.5)
WBC: 7.7 10*3/uL (ref 4.0–10.5)
nRBC: 0 % (ref 0.0–0.2)

## 2019-09-14 LAB — MAGNESIUM: Magnesium: 1.8 mg/dL (ref 1.7–2.4)

## 2019-09-14 NOTE — Progress Notes (Signed)
Pulmonary Critical Care Medicine [place]   PULMONARY CRITICAL CARE SERVICE  PROGRESS NOTE  Date of Service: 09/14/2019  Steve [name]  QHU:[id number]  DOB: 1955/05/04   DOA: 08/28/2019  Referring Physician: Merton Border, MD  HPI: Steve [name] is a 64 y.o. male seen for follow up of Acute on Chronic Respiratory Failure.  I had wanted to get a sniff test on the patient to determine whether the diaphragm on the right side is active however radiology states they cannot do the sniff test while patient is on the ventilator.  Discussed this with the primary care team and I think will need to continue to assess the patient's wean readiness.  Meanwhile patient has been consistently failing any attempts at weaning.  Patient currently is on pressure control on 45% FiO2  Medications: Reviewed on Rounds  Physical Exam:  Vitals: Temperature is 99.4 pulse 106 respiratory rate 39 blood pressure is 110/72 saturations are 98%  Ventilator Settings on pressure assist control FiO2 is 45% tidal volume 395 with a PEEP of 5 IP is 28  . General: Comfortable at this time . Eyes: Grossly normal lids, irises & conjunctiva . ENT: grossly tongue is normal . Neck: no obvious mass . Cardiovascular: S1 S2 normal no gallop . Respiratory: No rhonchi coarse breath sounds are noted at this time . Abdomen: soft . Skin: no rash seen on limited exam . Musculoskeletal: not rigid . Psychiatric:unable to assess . Neurologic: no seizure no involuntary movements         Lab Data:   Basic Metabolic Panel: Recent Labs  Lab 09/10/19 0743 09/11/19 0803 09/14/19 0718  NA 140  --  144  K 4.4  --  4.1  CL 86*  --  89*  CO2 42*  --  50*  GLUCOSE 155*  --  172*  BUN 15  --  38*  CREATININE <0.30*  --  0.36*  CALCIUM 9.6  --  9.8  MG 1.7 1.9 1.8    ABG: No results for input(s): PHART, PCO2ART, PO2ART, HCO3, O2SAT in the last 168 hours.  Liver Function Tests: No results for input(s):  AST, ALT, ALKPHOS, BILITOT, PROT, ALBUMIN in the last 168 hours. No results for input(s): LIPASE, AMYLASE in the last 168 hours. No results for input(s): AMMONIA in the last 168 hours.  CBC: Recent Labs  Lab 09/10/19 0743 09/14/19 0718  WBC 7.6 7.7  HGB 9.5* 9.3*  HCT 31.5* 32.2*  MCV 98.7 101.3*  PLT 166 196    Cardiac Enzymes: No results for input(s): CKTOTAL, CKMB, CKMBINDEX, TROPONINI in the last 168 hours.  BNP (last 3 results) Recent Labs    07/08/19 1815 07/11/19 0212 07/14/19 0458  BNP 407.1* 224.8* 55.9    ProBNP (last 3 results) No results for input(s): PROBNP in the last 8760 hours.  Radiological Exams: DG CHEST PORT 1 VIEW  Result Date: 09/13/2019 CLINICAL DATA:  Pneumonia EXAM: PORTABLE CHEST 1 VIEW COMPARISON:  September 10, 2019 FINDINGS: Again noted is tracheostomy tube and a left-sided central venous catheter in unchanged position. A left-sided chest tube is seen. Multifocal patchy airspace opacities are seen throughout both lungs with diffusely increased interstitial markings. Consolidation is seen. No acute osseous abnormality. IMPRESSION: Lines and tubes in unchanged position. Stable examination with diffuse bilateral interstitial and patchy airspace opacities. Electronically Signed   By: Prudencio Pair M.D.   On: 09/13/2019 06:44    Assessment/Plan Active Problems:   Acute on chronic respiratory  failure with hypoxia ([name])   Acute respiratory distress syndrome (ARDS) due to COVID-19 virus ([name])   Hodgkin's lymphoma ([place])   Pneumothorax, acute   Healthcare-associated pneumonia   COVID-19 virus infection   1. Acute on chronic respiratory failure hypoxia we will continue with pressure assist control currently on 45% FiO2.  Patient has failed spontaneous breathing trial once again.  And as already noted were not able to get the diaphragm evaluated as radiology says they cannot do it 2. ARDS treated we will continue with supportive care 3. Hodgkin's lymphoma  treated status post chemo therapy prognosis guarded 4. Acute pneumothorax chest tube placement 5. Healthcare associated pneumonia patient still has bilateral diffuse infiltrates 6. COVID-19 virus infection recovery   I have personally seen and evaluated the patient, evaluated laboratory and imaging results, formulated the assessment and plan and placed orders. The Patient requires high complexity decision making with multiple systems involvement.  Rounds were done with the Respiratory Therapy Director and Staff therapists and discussed with nursing staff also.  Allyne Gee, MD [facility] Pulmonary Critical Care Medicine Sleep Medicine

## 2019-09-15 ENCOUNTER — Other Ambulatory Visit (HOSPITAL_COMMUNITY): Payer: BC Managed Care – PPO

## 2019-09-15 NOTE — Progress Notes (Signed)
Pulmonary Critical Care Medicine [place]   PULMONARY CRITICAL CARE SERVICE  PROGRESS NOTE  Date of Service: 09/15/2019  Steve [name]  GYI:[id number]  DOB: [birth year]   DOA: 08/28/2019  Referring Physician: Merton Border, MD  HPI: Steve [name] is a 64 y.o. male seen for follow up of Acute on Chronic Respiratory Failure.  Patient is on the ventilator full support continues to fail attempts at weaning.  Right now is on pressure control mode  Medications: Reviewed on Rounds  Physical Exam:  Vitals: Temperature is 97.2 pulse 99 respiratory 34 blood pressure is 126/66 saturations 97%  Ventilator Settings on pressure assist control FiO2 50% IP 28 PEEP 5 tidal volume 350  . General: Comfortable at this time . Eyes: Grossly normal lids, irises & conjunctiva . ENT: grossly tongue is normal . Neck: no obvious mass . Cardiovascular: S1 S2 normal no gallop . Respiratory: Scattered rhonchi expansion equal . Abdomen: soft . Skin: no rash seen on limited exam . Musculoskeletal: not rigid . Psychiatric:unable to assess . Neurologic: no seizure no involuntary movements         Lab Data:   Basic Metabolic Panel: Recent Labs  Lab 09/10/19 0743 09/11/19 0803 09/14/19 0718  NA 140  --  144  K 4.4  --  4.1  CL 86*  --  89*  CO2 42*  --  50*  GLUCOSE 155*  --  172*  BUN 15  --  38*  CREATININE <0.30*  --  0.36*  CALCIUM 9.6  --  9.8  MG 1.7 1.9 1.8    ABG: No results for input(s): PHART, PCO2ART, PO2ART, HCO3, O2SAT in the last 168 hours.  Liver Function Tests: No results for input(s): AST, ALT, ALKPHOS, BILITOT, PROT, ALBUMIN in the last 168 hours. No results for input(s): LIPASE, AMYLASE in the last 168 hours. No results for input(s): AMMONIA in the last 168 hours.  CBC: Recent Labs  Lab 09/10/19 0743 09/14/19 0718  WBC 7.6 7.7  HGB 9.5* 9.3*  HCT 31.5* 32.2*  MCV 98.7 101.3*  PLT 166 196    Cardiac Enzymes: No results for input(s):  CKTOTAL, CKMB, CKMBINDEX, TROPONINI in the last 168 hours.  BNP (last 3 results) Recent Labs    07/08/19 1815 07/11/19 0212 07/14/19 0458  BNP 407.1* 224.8* 55.9    ProBNP (last 3 results) No results for input(s): PROBNP in the last 8760 hours.  Radiological Exams: No results found.  Assessment/Plan Active Problems:   Acute on chronic respiratory failure with hypoxia ([name])   Acute respiratory distress syndrome (ARDS) due to COVID-19 virus ([name])   Hodgkin's lymphoma ([name])   Pneumothorax, acute   Healthcare-associated pneumonia   COVID-19 virus infection   1. Acute on chronic respiratory failure hypoxia we will continue with full support on the ventilator currently on pressure control with more than 50% FiO2 with an IP of 28.  Respiratory therapy will reassess the RSB I am probably going 2. ARDS treated with residual damage to the lungs. 3. Hodgkin's lymphoma supportive care 4. Pneumothorax patient has chest tube in place 5. Healthcare associated pneumonia treated 6. COVID-19 virus infection has resolved however patient still has residual changes from the infection itself   I have personally seen and evaluated the patient, evaluated laboratory and imaging results, formulated the assessment and plan and placed orders. The Patient requires high complexity decision making with multiple systems involvement.  Rounds were done with the Respiratory Therapy Director and Staff therapists  and discussed with nursing staff also.  Allyne Gee, MD [facility] Pulmonary Critical Care Medicine Sleep Medicine

## 2019-09-16 ENCOUNTER — Other Ambulatory Visit (HOSPITAL_COMMUNITY): Payer: BC Managed Care – PPO

## 2019-09-16 LAB — CBC
HCT: 33.8 % — ABNORMAL LOW (ref 39.0–52.0)
Hemoglobin: 9.6 g/dL — ABNORMAL LOW (ref 13.0–17.0)
MCH: 28.9 pg (ref 26.0–34.0)
MCHC: 28.4 g/dL — ABNORMAL LOW (ref 30.0–36.0)
MCV: 101.8 fL — ABNORMAL HIGH (ref 80.0–100.0)
Platelets: 225 10*3/uL (ref 150–400)
RBC: 3.32 MIL/uL — ABNORMAL LOW (ref 4.22–5.81)
RDW: 15.4 % (ref 11.5–15.5)
WBC: 7.5 10*3/uL (ref 4.0–10.5)
nRBC: 0 % (ref 0.0–0.2)

## 2019-09-16 LAB — BASIC METABOLIC PANEL
BUN: 43 mg/dL — ABNORMAL HIGH (ref 8–23)
CO2: >50 mmol/L — ABNORMAL HIGH (ref 22–32)
Calcium: 9.9 mg/dL (ref 8.9–10.3)
Chloride: 86 mmol/L — ABNORMAL LOW (ref 98–111)
Creatinine, Ser: 0.37 mg/dL — ABNORMAL LOW (ref 0.61–1.24)
GFR calc Af Amer: >60 mL/min (ref >60)
GFR calc non Af Amer: >60 mL/min (ref >60)
Glucose, Bld: 162 mg/dL — ABNORMAL HIGH (ref 70–99)
Potassium: 4 mmol/L (ref 3.5–5.1)
Sodium: 145 mmol/L (ref 135–145)

## 2019-09-16 LAB — MAGNESIUM: Magnesium: 2.2 mg/dL (ref 1.7–2.4)

## 2019-09-16 NOTE — Progress Notes (Signed)
Pulmonary Critical Care Medicine [place]   PULMONARY CRITICAL CARE SERVICE  PROGRESS NOTE  Date of Service: 09/16/2019  Steve [name]  QMG:[id number]  DOB: Oct 16, 1955   DOA: 08/28/2019  Referring Physician: Merton Border, MD  HPI: Steve [name] is a 64 y.o. male seen for follow up of Acute on Chronic Respiratory Failure.  Patient currently is on full support on pressure assist control has been on 50% FiO2 with good saturations he has however been continuing to fail weaning.  Patient underwent a sniff test which was limited because of being on the ventilator.  They took him off briefly for the ventilator and he was found to have decreased diaphragmatic excursion bilaterally  Medications: Reviewed on Rounds  Physical Exam:  Vitals: Temperature is 98.1 pulse 98 respiratory rate 34 blood pressure is 120/71 saturations 95%  Ventilator Settings on pressure assist control FiO2 50% tidal volume 469 IP 28 PEEP 5  . General: Comfortable at this time . Eyes: Grossly normal lids, irises & conjunctiva . ENT: grossly tongue is normal . Neck: no obvious mass . Cardiovascular: S1 S2 normal no gallop . Respiratory: No rhonchi coarse breath sounds . Abdomen: soft . Skin: no rash seen on limited exam . Musculoskeletal: not rigid . Psychiatric:unable to assess . Neurologic: no seizure no involuntary movements         Lab Data:   Basic Metabolic Panel: Recent Labs  Lab 09/10/19 0743 09/11/19 0803 09/14/19 0718 09/16/19 0643  NA 140  --  144 145  K 4.4  --  4.1 4.0  CL 86*  --  89* 86*  CO2 42*  --  50* >50*  GLUCOSE 155*  --  172* 162*  BUN 15  --  38* 43*  CREATININE <0.30*  --  0.36* 0.37*  CALCIUM 9.6  --  9.8 9.9  MG 1.7 1.9 1.8 2.2    ABG: No results for input(s): PHART, PCO2ART, PO2ART, HCO3, O2SAT in the last 168 hours.  Liver Function Tests: No results for input(s): AST, ALT, ALKPHOS, BILITOT, PROT, ALBUMIN in the last 168 hours. No results  for input(s): LIPASE, AMYLASE in the last 168 hours. No results for input(s): AMMONIA in the last 168 hours.  CBC: Recent Labs  Lab 09/10/19 0743 09/14/19 0718 09/16/19 0643  WBC 7.6 7.7 7.5  HGB 9.5* 9.3* 9.6*  HCT 31.5* 32.2* 33.8*  MCV 98.7 101.3* 101.8*  PLT 166 196 225    Cardiac Enzymes: No results for input(s): CKTOTAL, CKMB, CKMBINDEX, TROPONINI in the last 168 hours.  BNP (last 3 results) Recent Labs    07/08/19 1815 07/11/19 0212 07/14/19 0458  BNP 407.1* 224.8* 55.9    ProBNP (last 3 results) No results for input(s): PROBNP in the last 8760 hours.  Radiological Exams: DG Chest 1 View  Result Date: 09/16/2019 CLINICAL DATA:  Respiratory failure EXAM: CHEST  1 VIEW COMPARISON:  09/15/2018 FINDINGS: Tracheostomy tube and left-sided chest wall port are again seen and stable. Cardiac shadow is stable. Persistent opacities are noted bilaterally slightly worse on the left than the right stable from the previous exam. IMPRESSION: Stable appearance of the chest when compared with the previous day. No recurrent pneumothorax is noted. Electronically Signed   By: Inez Catalina M.D.   On: 09/16/2019 12:59   DG Sniff Test  Result Date: 09/16/2019 CLINICAL DATA:  Ventilated patient. Patient having difficulty weaning off ventilator. EXAM: CHEST FLUOROSCOPY TECHNIQUE: Real-time fluoroscopic evaluation of the chest was performed. FLUOROSCOPY  TIME:  Fluoroscopy Time:  42 seconds Radiation Exposure Index (if provided by the fluoroscopic device): 18.1 mGy Number of Acquired Spot Images: 1 COMPARISON:  None. FINDINGS: With patient on mechanical ventilation, real-time assessment of diaphragmatic motion was performed. The diaphragm has equal inferior movement with inspiration. Mechanical ventilation was halted for several seconds. Patient has very shallow movement of the hemidiaphragms. The LEFT hemidiaphragm has greater movement than the RIGHT. IMPRESSION: 1. Equal diaphragmatic movement on  mechanical ventilation. 2. Off mechanical ventilation, very shallow movement of the hemidiaphragms. The LEFT hemidiaphragm appears to have slightly greater movement than the RIGHT. Electronically Signed   By: Suzy Bouchard M.D.   On: 09/16/2019 14:00   DG Chest Port 1 View  Result Date: 09/15/2019 CLINICAL DATA:  Chest tube removal EXAM: PORTABLE CHEST 1 VIEW COMPARISON:  Portable exam 1819 hours compared to 09/13/2019 FINDINGS: Tracheostomy tube and LEFT jugular Port-A-Cath unchanged. Interval removal of LEFT thoracostomy tube. Stable heart size and mediastinal contours. Asymmetric pulmonary infiltrates LEFT greater than RIGHT, with slightly improved aeration in RIGHT lung since previous study. No pleural effusion or pneumothorax. IMPRESSION: No pneumothorax following chest tube removal. Asymmetric pulmonary infiltrates LEFT greater than RIGHT, slightly improved on RIGHT since previous exam. Electronically Signed   By: Lavonia Dana M.D.   On: 09/15/2019 18:43    Assessment/Plan Active Problems:   Acute on chronic respiratory failure with hypoxia ([name])   Acute respiratory distress syndrome (ARDS) due to COVID-19 virus ([name])   Hodgkin's lymphoma ([name])   Pneumothorax, acute   Healthcare-associated pneumonia   COVID-19 virus infection   1. Acute on chronic respiratory failure hypoxia plan is to continue with pressure assist control with an FiO2 of 50% diaphragmatic excursion is weak and it was symmetric but decreased signifying significant weakness of both diaphragms 2. ARDS we will continue with supportive care 3. Hodgkin's lymphoma treated 4. Pneumothorax resolved 5. Healthcare associated pneumonia patient still with multifocal infiltrates 6. 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 The Patient requires high complexity decision making with multiple systems involvement.  Rounds were done with the Respiratory Therapy Director and Staff therapists and discussed with nursing staff also.  Allyne Gee, MD [facility] Pulmonary Critical Care Medicine Sleep Medicine

## 2019-09-17 LAB — URINALYSIS, ROUTINE W REFLEX MICROSCOPIC
Bilirubin Urine: NEGATIVE
Glucose, UA: NEGATIVE mg/dL
Hgb urine dipstick: NEGATIVE
Ketones, ur: NEGATIVE mg/dL
Leukocytes,Ua: NEGATIVE
Nitrite: NEGATIVE
Protein, ur: NEGATIVE mg/dL
Specific Gravity, Urine: 1.018 (ref 1.005–1.030)
pH: 8 (ref 5.0–8.0)

## 2019-09-17 LAB — BASIC METABOLIC PANEL
BUN: 46 mg/dL — ABNORMAL HIGH (ref 8–23)
CO2: >50 mmol/L — ABNORMAL HIGH (ref 22–32)
Calcium: 10 mg/dL (ref 8.9–10.3)
Chloride: 87 mmol/L — ABNORMAL LOW (ref 98–111)
Creatinine, Ser: 0.4 mg/dL — ABNORMAL LOW (ref 0.61–1.24)
GFR calc Af Amer: >60 mL/min (ref >60)
GFR calc non Af Amer: >60 mL/min (ref >60)
Glucose, Bld: 203 mg/dL — ABNORMAL HIGH (ref 70–99)
Potassium: 3.9 mmol/L (ref 3.5–5.1)
Sodium: 146 mmol/L — ABNORMAL HIGH (ref 135–145)

## 2019-09-17 NOTE — Progress Notes (Signed)
Pulmonary Critical Care Medicine [place]   PULMONARY CRITICAL CARE SERVICE  PROGRESS NOTE  Date of Service: 09/17/2019  Steve [name]  [id number]  DOB: 26-Feb-1956   DOA: 08/28/2019  Referring Physician: Merton Border, MD  HPI: Steve [name] is a 64 y.o. male seen for follow up of Acute on Chronic Respiratory Failure.  Patient continues to be on pressure control right now is on 45% FiO2 with a PEEP of 5  Medications: Reviewed on Rounds  Physical Exam:  Vitals: Temperature is 97.0 pulse 93 respiratory 32 blood pressure is 117/79 saturations 100%  Ventilator Settings off the ventilator on pressure control with an FiO2 of 45% PEEP 5 tidal volume 400  . General: Comfortable at this time . Eyes: Grossly normal lids, irises & conjunctiva . ENT: grossly tongue is normal . Neck: no obvious mass . Cardiovascular: S1 S2 normal no gallop . Respiratory: Coarse breath sounds with a few rhonchi . Abdomen: soft . Skin: no rash seen on limited exam . Musculoskeletal: not rigid . Psychiatric:unable to assess . Neurologic: no seizure no involuntary movements         Lab Data:   Basic Metabolic Panel: Recent Labs  Lab 09/11/19 0803 09/14/19 0718 09/16/19 0643 09/17/19 0555  NA  --  144 145 146*  K  --  4.1 4.0 3.9  CL  --  89* 86* 87*  CO2  --  50* >50* >50*  GLUCOSE  --  172* 162* 203*  BUN  --  38* 43* 46*  CREATININE  --  0.36* 0.37* 0.40*  CALCIUM  --  9.8 9.9 10.0  MG 1.9 1.8 2.2  --     ABG: No results for input(s): PHART, PCO2ART, PO2ART, HCO3, O2SAT in the last 168 hours.  Liver Function Tests: No results for input(s): AST, ALT, ALKPHOS, BILITOT, PROT, ALBUMIN in the last 168 hours. No results for input(s): LIPASE, AMYLASE in the last 168 hours. No results for input(s): AMMONIA in the last 168 hours.  CBC: Recent Labs  Lab 09/14/19 0718 09/16/19 0643  WBC 7.7 7.5  HGB 9.3* 9.6*  HCT 32.2* 33.8*  MCV 101.3* 101.8*  PLT 196 225     Cardiac Enzymes: No results for input(s): CKTOTAL, CKMB, CKMBINDEX, TROPONINI in the last 168 hours.  BNP (last 3 results) Recent Labs    07/08/19 1815 07/11/19 0212 07/14/19 0458  BNP 407.1* 224.8* 55.9    ProBNP (last 3 results) No results for input(s): PROBNP in the last 8760 hours.  Radiological Exams: DG Chest 1 View  Result Date: 09/16/2019 CLINICAL DATA:  Respiratory failure EXAM: CHEST  1 VIEW COMPARISON:  09/15/2018 FINDINGS: Tracheostomy tube and left-sided chest wall port are again seen and stable. Cardiac shadow is stable. Persistent opacities are noted bilaterally slightly worse on the left than the right stable from the previous exam. IMPRESSION: Stable appearance of the chest when compared with the previous day. No recurrent pneumothorax is noted. Electronically Signed   By: Inez Catalina M.D.   On: 09/16/2019 12:59   DG Sniff Test  Result Date: 09/16/2019 CLINICAL DATA:  Ventilated patient. Patient having difficulty weaning off ventilator. EXAM: CHEST FLUOROSCOPY TECHNIQUE: Real-time fluoroscopic evaluation of the chest was performed. FLUOROSCOPY TIME:  Fluoroscopy Time:  42 seconds Radiation Exposure Index (if provided by the fluoroscopic device): 18.1 mGy Number of Acquired Spot Images: 1 COMPARISON:  None. FINDINGS: With patient on mechanical ventilation, real-time assessment of diaphragmatic motion was performed. The diaphragm has  equal inferior movement with inspiration. Mechanical ventilation was halted for several seconds. Patient has very shallow movement of the hemidiaphragms. The LEFT hemidiaphragm has greater movement than the RIGHT. IMPRESSION: 1. Equal diaphragmatic movement on mechanical ventilation. 2. Off mechanical ventilation, very shallow movement of the hemidiaphragms. The LEFT hemidiaphragm appears to have slightly greater movement than the RIGHT. Electronically Signed   By: Suzy Bouchard M.D.   On: 09/16/2019 14:00   DG Chest Port 1 View  Result  Date: 09/15/2019 CLINICAL DATA:  Chest tube removal EXAM: PORTABLE CHEST 1 VIEW COMPARISON:  Portable exam 1819 hours compared to 09/13/2019 FINDINGS: Tracheostomy tube and LEFT jugular Port-A-Cath unchanged. Interval removal of LEFT thoracostomy tube. Stable heart size and mediastinal contours. Asymmetric pulmonary infiltrates LEFT greater than RIGHT, with slightly improved aeration in RIGHT lung since previous study. No pleural effusion or pneumothorax. IMPRESSION: No pneumothorax following chest tube removal. Asymmetric pulmonary infiltrates LEFT greater than RIGHT, slightly improved on RIGHT since previous exam. Electronically Signed   By: Lavonia Dana M.D.   On: 09/15/2019 18:43    Assessment/Plan Active Problems:   Acute on chronic respiratory failure with hypoxia ([name])   Acute respiratory distress syndrome (ARDS) due to COVID-19 virus ([name])   Hodgkin's lymphoma ([name])   Pneumothorax, acute   Healthcare-associated pneumonia   COVID-19 virus infection   1. Acute on chronic respiratory failure hypoxia patient continues to fail weaning attempts.  Right now back on the ventilator and pressure control mode.  We will continue to have respiratory therapy assess. 2. Pneumothorax no residual pneumothorax noted after chest tube removal we will continue with supportive care 3. Healthcare associated pneumonia some improvement in the infiltrates noted on the last chest film. 4. Hodgkin's lymphoma at baseline we will continue with supportive care 5. ARDS slow to improve we will follow along 6. COVID-19 virus infection resolved   I have personally seen and evaluated the patient, evaluated laboratory and imaging results, formulated the assessment and plan and placed orders. The Patient requires high complexity decision making with multiple systems involvement.  Rounds were done with the Respiratory Therapy Director and Staff therapists and discussed with nursing staff also.  Allyne Gee, MD  [facility] Pulmonary Critical Care Medicine Sleep Medicine

## 2019-09-18 LAB — BLOOD GAS, VENOUS
Acid-Base Excess: 25.3 mmol/L — ABNORMAL HIGH (ref 0.0–2.0)
Bicarbonate: 52.1 mmol/L — ABNORMAL HIGH (ref 20.0–28.0)
Drawn by: 19824
FIO2: 45
O2 Saturation: 67.3 %
Patient temperature: 37
pCO2, Ven: 89.5 mmHg (ref 44.0–60.0)
pH, Ven: 7.383 (ref 7.250–7.430)
pO2, Ven: 38.1 mmHg (ref 32.0–45.0)

## 2019-09-18 LAB — BASIC METABOLIC PANEL
BUN: 44 mg/dL — ABNORMAL HIGH (ref 8–23)
CO2: >50 mmol/L — ABNORMAL HIGH (ref 22–32)
Calcium: 10.2 mg/dL (ref 8.9–10.3)
Chloride: 89 mmol/L — ABNORMAL LOW (ref 98–111)
Creatinine, Ser: 0.48 mg/dL — ABNORMAL LOW (ref 0.61–1.24)
GFR calc Af Amer: >60 mL/min (ref >60)
GFR calc non Af Amer: >60 mL/min (ref >60)
Glucose, Bld: 180 mg/dL — ABNORMAL HIGH (ref 70–99)
Potassium: 3.9 mmol/L (ref 3.5–5.1)
Sodium: 148 mmol/L — ABNORMAL HIGH (ref 135–145)

## 2019-09-18 NOTE — Progress Notes (Signed)
Pulmonary Critical Care Medicine [place]   PULMONARY CRITICAL CARE SERVICE  PROGRESS NOTE  Date of Service: 09/18/2019  Steve [name]  Steve [name]  DOB: 23-Jul-1955   DOA: 08/28/2019  Referring Physician: Merton Border, MD  HPI: Steve [name] is a 64 y.o. male seen for follow up of Acute on Chronic Respiratory Failure.  Patient is on full support right now on pressure control mode has been on 45% FiO2 and ip is 28 with a PEEP of 5  Medications: Reviewed on Rounds  Physical Exam:  Vitals: Temperature is 96.6 pulse 94 respiratory rate 30 blood pressure is 119/71 saturations 100%  Ventilator Settings on pressure assist control FiO2 45% tidal volume 417 IP 28 PEEP 5  . General: Comfortable at this time . Eyes: Grossly normal lids, irises & conjunctiva . ENT: grossly tongue is normal . Neck: no obvious mass . Cardiovascular: S1 S2 normal no gallop . Respiratory: No rhonchi coarse breath sounds noted . Abdomen: soft . Skin: no rash seen on limited exam . Musculoskeletal: not rigid . Psychiatric:unable to assess . Neurologic: no seizure no involuntary movements         Lab Data:   Basic Metabolic Panel: Recent Labs  Lab 09/14/19 0718 09/16/19 0643 09/17/19 0555 09/18/19 0550  NA 144 145 146* 148*  K 4.1 4.0 3.9 3.9  CL 89* 86* 87* 89*  CO2 50* >50* >50* >50*  GLUCOSE 172* 162* 203* 180*  BUN 38* 43* 46* 44*  CREATININE 0.36* 0.37* 0.40* 0.48*  CALCIUM 9.8 9.9 10.0 10.2  MG 1.8 2.2  --   --     ABG: Recent Labs  Lab 09/18/19 1025  HCO3 52.1*  O2SAT 67.3    Liver Function Tests: No results for input(s): AST, ALT, ALKPHOS, BILITOT, PROT, ALBUMIN in the last 168 hours. No results for input(s): LIPASE, AMYLASE in the last 168 hours. No results for input(s): AMMONIA in the last 168 hours.  CBC: Recent Labs  Lab 09/14/19 0718 09/16/19 0643  WBC 7.7 7.5  HGB 9.3* 9.6*  HCT 32.2* 33.8*  MCV 101.3* 101.8*  PLT 196 225     Cardiac Enzymes: No results for input(s): CKTOTAL, CKMB, CKMBINDEX, TROPONINI in the last 168 hours.  BNP (last 3 results) Recent Labs    07/08/19 1815 07/11/19 0212 07/14/19 0458  BNP 407.1* 224.8* 55.9    ProBNP (last 3 results) No results for input(s): PROBNP in the last 8760 hours.  Radiological Exams: No results found.  Assessment/Plan Active Problems:   Acute on chronic respiratory failure with hypoxia ([name])   Acute respiratory distress syndrome (ARDS) due to COVID-19 virus ([name])   Hodgkin's lymphoma ([name])   Pneumothorax, acute   Healthcare-associated pneumonia   COVID-19 virus infection   1. Acute on chronic respiratory failure with hypoxia plan is to continue with full support patient was attempted at weaning again and did not tolerate.  Continue secretion management supportive care 2. ARDS clinically is improving however patient's mechanics are not improving 3. Hodgkin's lymphoma treated continue with supportive care 4. Acute pneumothorax resolved 5. Healthcare associated pneumonia treated 6. COVID-19 virus infection patient has severe significant pulmonary damage secondary to the infection   I have personally seen and evaluated the patient, evaluated laboratory and imaging results, formulated the assessment and plan and placed orders. The Patient requires high complexity decision making with multiple systems involvement.  Rounds were done with the Respiratory Therapy Director and Staff therapists and discussed with nursing staff  also.  Allyne Gee, MD [facility] Pulmonary Critical Care Medicine Sleep Medicine

## 2019-09-19 LAB — BASIC METABOLIC PANEL
Anion gap: 5 (ref 5–15)
BUN: 40 mg/dL — ABNORMAL HIGH (ref 8–23)
CO2: 47 mmol/L — ABNORMAL HIGH (ref 22–32)
Calcium: 9.6 mg/dL (ref 8.9–10.3)
Chloride: 96 mmol/L — ABNORMAL LOW (ref 98–111)
Creatinine, Ser: 0.38 mg/dL — ABNORMAL LOW (ref 0.61–1.24)
GFR calc Af Amer: >60 mL/min (ref >60)
GFR calc non Af Amer: >60 mL/min (ref >60)
Glucose, Bld: 165 mg/dL — ABNORMAL HIGH (ref 70–99)
Potassium: 3.5 mmol/L (ref 3.5–5.1)
Sodium: 148 mmol/L — ABNORMAL HIGH (ref 135–145)

## 2019-09-19 NOTE — Progress Notes (Signed)
Pulmonary Critical Care Medicine [place]   PULMONARY CRITICAL CARE SERVICE  PROGRESS NOTE  Date of Service: 09/19/2019  Steve [name]  [id number]  DOB: 08-18-1955   DOA: 08/28/2019  Referring Physician: Merton Border, MD  HPI: Steve [name] is a 64 y.o. male seen for follow up of Acute on Chronic Respiratory Failure.  Patient currently is on full support on the ventilator was attempted at weaning once again and failed respiratory therapy will reassess again this afternoon  Medications: Reviewed on Rounds  Physical Exam:  Vitals: Temperature is 98.7 pulse 98 respiratory 25 blood pressure is 103/64 saturations 99%  Ventilator Settings on pressure assist control FiO2 45% tidal volume 482 PEEP 5 tidal volume 500  . General: Comfortable at this time . Eyes: Grossly normal lids, irises & conjunctiva . ENT: grossly tongue is normal . Neck: no obvious mass . Cardiovascular: S1 S2 normal no gallop . Respiratory: No rhonchi no rales . Abdomen: soft . Skin: no rash seen on limited exam . Musculoskeletal: not rigid . Psychiatric:unable to assess . Neurologic: no seizure no involuntary movements         Lab Data:   Basic Metabolic Panel: Recent Labs  Lab 09/14/19 0718 09/16/19 0643 09/17/19 0555 09/18/19 0550 09/19/19 0554  NA 144 145 146* 148* 148*  K 4.1 4.0 3.9 3.9 3.5  CL 89* 86* 87* 89* 96*  CO2 50* >50* >50* >50* 47*  GLUCOSE 172* 162* 203* 180* 165*  BUN 38* 43* 46* 44* 40*  CREATININE 0.36* 0.37* 0.40* 0.48* 0.38*  CALCIUM 9.8 9.9 10.0 10.2 9.6  MG 1.8 2.2  --   --   --     ABG: Recent Labs  Lab 09/18/19 1025  HCO3 52.1*  O2SAT 67.3    Liver Function Tests: No results for input(s): AST, ALT, ALKPHOS, BILITOT, PROT, ALBUMIN in the last 168 hours. No results for input(s): LIPASE, AMYLASE in the last 168 hours. No results for input(s): AMMONIA in the last 168 hours.  CBC: Recent Labs  Lab 09/14/19 0718 09/16/19 0643   WBC 7.7 7.5  HGB 9.3* 9.6*  HCT 32.2* 33.8*  MCV 101.3* 101.8*  PLT 196 225    Cardiac Enzymes: No results for input(s): CKTOTAL, CKMB, CKMBINDEX, TROPONINI in the last 168 hours.  BNP (last 3 results) Recent Labs    07/08/19 1815 07/11/19 0212 07/14/19 0458  BNP 407.1* 224.8* 55.9    ProBNP (last 3 results) No results for input(s): PROBNP in the last 8760 hours.  Radiological Exams: No results found.  Assessment/Plan Active Problems:   Acute on chronic respiratory failure with hypoxia ([name])   Acute respiratory distress syndrome (ARDS) due to COVID-19 virus ([name])   Hodgkin's lymphoma ([name])   Pneumothorax, acute   Healthcare-associated pneumonia   COVID-19 virus infection   1. Acute on chronic respiratory failure hypoxia plan is to continue with assessing for weaning readiness.  Respiratory therapy will recheck this afternoon 2. ARDS treated clinically is improving 3. Hodgkin's lymphoma at baseline 4. Pneumothorax resolved 5. Healthcare associated pneumonia treated slow improvement 6. COVID-19 virus infection patient has severe residual damage to the lungs from the Covid we will continue to monitor closely.   I have personally seen and evaluated the patient, evaluated laboratory and imaging results, formulated the assessment and plan and placed orders. The Patient requires high complexity decision making with multiple systems involvement.  Rounds were done with the Respiratory Therapy Director and Staff therapists and discussed with  nursing staff also.  Allyne Gee, MD [facility] Pulmonary Critical Care Medicine Sleep Medicine

## 2019-09-20 LAB — BASIC METABOLIC PANEL
Anion gap: 5 (ref 5–15)
BUN: 26 mg/dL — ABNORMAL HIGH (ref 8–23)
CO2: 43 mmol/L — ABNORMAL HIGH (ref 22–32)
Calcium: 9.6 mg/dL (ref 8.9–10.3)
Chloride: 101 mmol/L (ref 98–111)
Creatinine, Ser: 0.35 mg/dL — ABNORMAL LOW (ref 0.61–1.24)
GFR calc Af Amer: >60 mL/min (ref >60)
GFR calc non Af Amer: >60 mL/min (ref >60)
Glucose, Bld: 169 mg/dL — ABNORMAL HIGH (ref 70–99)
Potassium: 3.9 mmol/L (ref 3.5–5.1)
Sodium: 149 mmol/L — ABNORMAL HIGH (ref 135–145)

## 2019-09-20 NOTE — Progress Notes (Addendum)
Pulmonary Critical Care Medicine [place]   PULMONARY CRITICAL CARE SERVICE  PROGRESS NOTE  Date of Service: 09/20/2019  LEMUEL [name]  [id number]  DOB: 24-Sep-1955   DOA: 08/28/2019  Referring Physician: Merton Border, MD  HPI: Steve [name] is a 64 y.o. male seen for follow up of Acute on Chronic Respiratory Failure.  Patient failed wean today remains on pressure flow rate of 24 with an FiO2 of 40% currently satting well no distress.  Medications: Reviewed on Rounds  Physical Exam:  Vitals: Pulse 79 R 32 BP 150/74 O2 sat 98% temp 98.6  Ventilator Settings pressure control mode rate of 24 respiratory pressure of 28 PEEP of 5 and FiO2 45%  . General: Comfortable at this time . Eyes: Grossly normal lids, irises & conjunctiva . ENT: grossly tongue is normal . Neck: no obvious mass . Cardiovascular: S1 S2 normal no gallop . Respiratory: No rales or rhonchi noted . Abdomen: soft . Skin: no rash seen on limited exam . Musculoskeletal: not rigid . Psychiatric:unable to assess . Neurologic: no seizure no involuntary movements         Lab Data:   Basic Metabolic Panel: Recent Labs  Lab 09/14/19 0718 09/14/19 0718 09/16/19 0643 09/17/19 0555 09/18/19 0550 09/19/19 0554 09/20/19 0530  NA 144   < > 145 146* 148* 148* 149*  K 4.1   < > 4.0 3.9 3.9 3.5 3.9  CL 89*   < > 86* 87* 89* 96* 101  CO2 50*   < > >50* >50* >50* 47* 43*  GLUCOSE 172*   < > 162* 203* 180* 165* 169*  BUN 38*   < > 43* 46* 44* 40* 26*  CREATININE 0.36*   < > 0.37* 0.40* 0.48* 0.38* 0.35*  CALCIUM 9.8   < > 9.9 10.0 10.2 9.6 9.6  MG 1.8  --  2.2  --   --   --   --    < > = values in this interval not displayed.    ABG: Recent Labs  Lab 09/18/19 1025  HCO3 52.1*  O2SAT 67.3    Liver Function Tests: No results for input(s): AST, ALT, ALKPHOS, BILITOT, PROT, ALBUMIN in the last 168 hours. No results for input(s): LIPASE, AMYLASE in the last 168 hours. No results  for input(s): AMMONIA in the last 168 hours.  CBC: Recent Labs  Lab 09/14/19 0718 09/16/19 0643  WBC 7.7 7.5  HGB 9.3* 9.6*  HCT 32.2* 33.8*  MCV 101.3* 101.8*  PLT 196 225    Cardiac Enzymes: No results for input(s): CKTOTAL, CKMB, CKMBINDEX, TROPONINI in the last 168 hours.  BNP (last 3 results) Recent Labs    07/08/19 1815 07/11/19 0212 07/14/19 0458  BNP 407.1* 224.8* 55.9    ProBNP (last 3 results) No results for input(s): PROBNP in the last 8760 hours.  Radiological Exams: No results found.  Assessment/Plan Active Problems:   Acute on chronic respiratory failure with hypoxia ([name])   Acute respiratory distress syndrome (ARDS) due to COVID-19 virus ([name])   Hodgkin's lymphoma ([name])   Pneumothorax, acute   Healthcare-associated pneumonia   COVID-19 virus infection   1. Acute on chronic respiratory failure hypoxia continue with full support on pressure control mode at this time continue to attempt weaning as tolerated protocol.  Continuous pulmonary toilet supportive measures 2. ARDS treated clinically is improving 3. Hodgkin's lymphoma at baseline 4. Pneumothorax resolved 5. Healthcare associated pneumonia treated slow improvement 6. COVID-19 virus  infection patient has severe residual damage to the lungs from the Covid we will continue to monitor closely.   I have personally seen and evaluated the patient, evaluated laboratory and imaging results, formulated the assessment and plan and placed orders. The Patient requires high complexity decision making with multiple systems involvement.  Rounds were done with the Respiratory Therapy Director and Staff therapists and discussed with nursing staff also.  Allyne Gee, MD [facility] Pulmonary Critical Care Medicine Sleep Medicine

## 2019-09-21 LAB — BASIC METABOLIC PANEL
Anion gap: 11 (ref 5–15)
BUN: 28 mg/dL — ABNORMAL HIGH (ref 8–23)
CO2: 38 mmol/L — ABNORMAL HIGH (ref 22–32)
Calcium: 9.4 mg/dL (ref 8.9–10.3)
Chloride: 101 mmol/L (ref 98–111)
Creatinine, Ser: 0.34 mg/dL — ABNORMAL LOW (ref 0.61–1.24)
GFR calc Af Amer: >60 mL/min (ref >60)
GFR calc non Af Amer: >60 mL/min (ref >60)
Glucose, Bld: 162 mg/dL — ABNORMAL HIGH (ref 70–99)
Potassium: 4.1 mmol/L (ref 3.5–5.1)
Sodium: 150 mmol/L — ABNORMAL HIGH (ref 135–145)

## 2019-09-21 NOTE — Progress Notes (Signed)
Pulmonary Critical Care Medicine [place]   PULMONARY CRITICAL CARE SERVICE  PROGRESS NOTE  Date of Service: 09/21/2019  POSEY PETRIK  [id number]  DOB: 09/10/1955   DOA: 08/28/2019  Referring Physician: Merton Border, MD  HPI: Steve [name] is a 64 y.o. male seen for follow up of Acute on Chronic Respiratory Failure.  Patient continues to fail attempts at weaning and pressure control right now on 45% FiO2  Medications: Reviewed on Rounds  Physical Exam:  Vitals: Temperature is 97.6 pulse 80 respiratory rate 28 blood pressure is 135/78 saturations 99%  Ventilator Settings on pressure assist control FiO2 is 45% tidal volume 445 IP 20 PEEP 5  . General: Comfortable at this time . Eyes: Grossly normal lids, irises & conjunctiva . ENT: grossly tongue is normal . Neck: no obvious mass . Cardiovascular: S1 S2 normal no gallop . Respiratory: No rhonchi coarse breath sounds . Abdomen: soft . Skin: no rash seen on limited exam . Musculoskeletal: not rigid . Psychiatric:unable to assess . Neurologic: no seizure no involuntary movements         Lab Data:   Basic Metabolic Panel: Recent Labs  Lab 09/16/19 0643 09/16/19 0643 09/17/19 0555 09/18/19 0550 09/19/19 0554 09/20/19 0530 09/21/19 0724  NA 145   < > 146* 148* 148* 149* 150*  K 4.0   < > 3.9 3.9 3.5 3.9 4.1  CL 86*   < > 87* 89* 96* 101 101  CO2 >50*   < > >50* >50* 47* 43* 38*  GLUCOSE 162*   < > 203* 180* 165* 169* 162*  BUN 43*   < > 46* 44* 40* 26* 28*  CREATININE 0.37*   < > 0.40* 0.48* 0.38* 0.35* 0.34*  CALCIUM 9.9   < > 10.0 10.2 9.6 9.6 9.4  MG 2.2  --   --   --   --   --   --    < > = values in this interval not displayed.    ABG: Recent Labs  Lab 09/18/19 1025  HCO3 52.1*  O2SAT 67.3    Liver Function Tests: No results for input(s): AST, ALT, ALKPHOS, BILITOT, PROT, ALBUMIN in the last 168 hours. No results for input(s): LIPASE, AMYLASE in the last 168 hours. No  results for input(s): AMMONIA in the last 168 hours.  CBC: Recent Labs  Lab 09/16/19 0643  WBC 7.5  HGB 9.6*  HCT 33.8*  MCV 101.8*  PLT 225    Cardiac Enzymes: No results for input(s): CKTOTAL, CKMB, CKMBINDEX, TROPONINI in the last 168 hours.  BNP (last 3 results) Recent Labs    07/08/19 1815 07/11/19 0212 07/14/19 0458  BNP 407.1* 224.8* 55.9    ProBNP (last 3 results) No results for input(s): PROBNP in the last 8760 hours.  Radiological Exams: No results found.  Assessment/Plan Active Problems:   Acute on chronic respiratory failure with hypoxia ([name])   Acute respiratory distress syndrome (ARDS) due to COVID-19 virus ([name])   Hodgkin's lymphoma ([name])   Pneumothorax, acute   Healthcare-associated pneumonia   COVID-19 virus infection   1. Acute on chronic respiratory failure hypoxia we will continue with full support on the ventilator patient's failing attempts at weaning 2. ARDS treated clinically improving 3. Hodgkin's lymphoma follow-up after discharge 4. COVID-19 virus infection advanced disease prognosis guarded 5. Pneumothorax at baseline we will continue to monitor   I have personally seen and evaluated the patient, evaluated laboratory and imaging results, formulated  the assessment and plan and placed orders. The Patient requires high complexity decision making with multiple systems involvement.  Rounds were done with the Respiratory Therapy Director and Staff therapists and discussed with nursing staff also.  Allyne Gee, MD [facility] Pulmonary Critical Care Medicine Sleep Medicine

## 2019-09-22 LAB — BASIC METABOLIC PANEL
Anion gap: 8 (ref 5–15)
BUN: 20 mg/dL (ref 8–23)
CO2: 39 mmol/L — ABNORMAL HIGH (ref 22–32)
Calcium: 9.7 mg/dL (ref 8.9–10.3)
Chloride: 100 mmol/L (ref 98–111)
Creatinine, Ser: 0.35 mg/dL — ABNORMAL LOW (ref 0.61–1.24)
GFR calc Af Amer: >60 mL/min (ref >60)
GFR calc non Af Amer: >60 mL/min (ref >60)
Glucose, Bld: 153 mg/dL — ABNORMAL HIGH (ref 70–99)
Potassium: 4.1 mmol/L (ref 3.5–5.1)
Sodium: 147 mmol/L — ABNORMAL HIGH (ref 135–145)

## 2019-09-22 NOTE — Progress Notes (Signed)
Pulmonary Critical Care Medicine [place]   PULMONARY CRITICAL CARE SERVICE  PROGRESS NOTE  Date of Service: 09/22/2019  Steve [name]  [id number]  DOB: 1955-11-28   DOA: 08/28/2019  Referring Physician: Merton Border, MD  HPI: Steve [name] is a 64 y.o. male seen for follow up of Acute on Chronic Respiratory Failure.  Patient is on pressure control mode right now on 40% FiO2 with a PEEP of 5  Medications: Reviewed on Rounds  Physical Exam:  Vitals: Temperature is 97.6 pulse 87 respiratory 20 blood pressure is 134/71 saturations 98%  Ventilator Settings on pressure assist control FiO2 40% tidal volume 473 PEEP 5 IP 28  . General: Comfortable at this time . Eyes: Grossly normal lids, irises & conjunctiva . ENT: grossly tongue is normal . Neck: no obvious mass . Cardiovascular: S1 S2 normal no gallop . Respiratory: No rhonchi coarse breath sounds . Abdomen: soft . Skin: no rash seen on limited exam . Musculoskeletal: not rigid . Psychiatric:unable to assess . Neurologic: no seizure no involuntary movements         Lab Data:   Basic Metabolic Panel: Recent Labs  Lab 09/16/19 0643 09/17/19 0555 09/18/19 0550 09/19/19 0554 09/20/19 0530 09/21/19 0724 09/22/19 0944  NA 145   < > 148* 148* 149* 150* 147*  K 4.0   < > 3.9 3.5 3.9 4.1 4.1  CL 86*   < > 89* 96* 101 101 100  CO2 >50*   < > >50* 47* 43* 38* 39*  GLUCOSE 162*   < > 180* 165* 169* 162* 153*  BUN 43*   < > 44* 40* 26* 28* 20  CREATININE 0.37*   < > 0.48* 0.38* 0.35* 0.34* 0.35*  CALCIUM 9.9   < > 10.2 9.6 9.6 9.4 9.7  MG 2.2  --   --   --   --   --   --    < > = values in this interval not displayed.    ABG: Recent Labs  Lab 09/18/19 1025  HCO3 52.1*  O2SAT 67.3    Liver Function Tests: No results for input(s): AST, ALT, ALKPHOS, BILITOT, PROT, ALBUMIN in the last 168 hours. No results for input(s): LIPASE, AMYLASE in the last 168 hours. No results for input(s):  AMMONIA in the last 168 hours.  CBC: Recent Labs  Lab 09/16/19 0643  WBC 7.5  HGB 9.6*  HCT 33.8*  MCV 101.8*  PLT 225    Cardiac Enzymes: No results for input(s): CKTOTAL, CKMB, CKMBINDEX, TROPONINI in the last 168 hours.  BNP (last 3 results) Recent Labs    07/08/19 1815 07/11/19 0212 07/14/19 0458  BNP 407.1* 224.8* 55.9    ProBNP (last 3 results) No results for input(s): PROBNP in the last 8760 hours.  Radiological Exams: No results found.  Assessment/Plan Active Problems:   Acute on chronic respiratory failure with hypoxia ([name])   Acute respiratory distress syndrome (ARDS) due to COVID-19 virus ([name])   Hodgkin's lymphoma ([name])   Pneumothorax, acute   Healthcare-associated pneumonia   COVID-19 virus infection   1. Acute on chronic respiratory failure hypoxia plan is to continue with full support patient has been consistently failing attempts at weaning 2. ARDS secondary to COVID-19 patient has residual damage noted on the x-rays slow to improve 3. Hodgkin's lymphoma supportive care 4. Pneumothorax supportive care follow radiologically 5. Healthcare associated pneumonia has been treated 6. COVID-19 virus infection in recovery phase but patient has  significant residual damage to the lungs with progressive fibrotic changes noted on the last CT in May would consider doing a repeat CT to see if there has been any improvement since it is now 30 days out   I have personally seen and evaluated the patient, evaluated laboratory and imaging results, formulated the assessment and plan and placed orders. The Patient requires high complexity decision making with multiple systems involvement.  Rounds were done with the Respiratory Therapy Director and Staff therapists and discussed with nursing staff also.  Steve Gee, MD [facility] Pulmonary Critical Care Medicine Sleep Medicine

## 2019-09-23 LAB — BASIC METABOLIC PANEL
Anion gap: 6 (ref 5–15)
BUN: 22 mg/dL (ref 8–23)
CO2: 42 mmol/L — ABNORMAL HIGH (ref 22–32)
Calcium: 9.7 mg/dL (ref 8.9–10.3)
Chloride: 100 mmol/L (ref 98–111)
Creatinine, Ser: 0.37 mg/dL — ABNORMAL LOW (ref 0.61–1.24)
GFR calc Af Amer: >60 mL/min (ref >60)
GFR calc non Af Amer: >60 mL/min (ref >60)
Glucose, Bld: 147 mg/dL — ABNORMAL HIGH (ref 70–99)
Potassium: 4.2 mmol/L (ref 3.5–5.1)
Sodium: 148 mmol/L — ABNORMAL HIGH (ref 135–145)

## 2019-09-23 NOTE — Progress Notes (Signed)
Pulmonary Critical Care Medicine [place]   PULMONARY CRITICAL CARE SERVICE  PROGRESS NOTE  Date of Service: 09/23/2019  Steve [name]  ZOX:[id number]  DOB: 05/28/1955   DOA: 08/28/2019  Referring Physician: Merton Border, MD  HPI: Steve [name] is a 64 y.o. male seen for follow up of Acute on Chronic Respiratory Failure.  Patient currently is on full support on the ventilator this morning was tried on pressure support but failed so had to be placed back on pressure assist control  Medications: Reviewed on Rounds  Physical Exam:  Vitals: Temperature is 96.8 pulse 87 respiratory 29 blood pressure is 97/65 saturations 93%  Ventilator Settings on pressure assist control FiO2 is 40% tidal volume 465 PEEP 5  . General: Comfortable at this time . Eyes: Grossly normal lids, irises & conjunctiva . ENT: grossly tongue is normal . Neck: no obvious mass . Cardiovascular: S1 S2 normal no gallop . Respiratory: No rhonchi coarse breath sounds . Abdomen: soft . Skin: no rash seen on limited exam . Musculoskeletal: not rigid . Psychiatric:unable to assess . Neurologic: no seizure no involuntary movements         Lab Data:   Basic Metabolic Panel: Recent Labs  Lab 09/19/19 0554 09/20/19 0530 09/21/19 0724 09/22/19 0944 09/23/19 0652  NA 148* 149* 150* 147* 148*  K 3.5 3.9 4.1 4.1 4.2  CL 96* 101 101 100 100  CO2 47* 43* 38* 39* 42*  GLUCOSE 165* 169* 162* 153* 147*  BUN 40* 26* 28* 20 22  CREATININE 0.38* 0.35* 0.34* 0.35* 0.37*  CALCIUM 9.6 9.6 9.4 9.7 9.7    ABG: Recent Labs  Lab 09/18/19 1025  HCO3 52.1*  O2SAT 67.3    Liver Function Tests: No results for input(s): AST, ALT, ALKPHOS, BILITOT, PROT, ALBUMIN in the last 168 hours. No results for input(s): LIPASE, AMYLASE in the last 168 hours. No results for input(s): AMMONIA in the last 168 hours.  CBC: No results for input(s): WBC, NEUTROABS, HGB, HCT, MCV, PLT in the last 168  hours.  Cardiac Enzymes: No results for input(s): CKTOTAL, CKMB, CKMBINDEX, TROPONINI in the last 168 hours.  BNP (last 3 results) Recent Labs    07/08/19 1815 07/11/19 0212 07/14/19 0458  BNP 407.1* 224.8* 55.9    ProBNP (last 3 results) No results for input(s): PROBNP in the last 8760 hours.  Radiological Exams: No results found.  Assessment/Plan Active Problems:   Acute on chronic respiratory failure with hypoxia ([name])   Acute respiratory distress syndrome (ARDS) due to COVID-19 virus ([name])   Hodgkin's lymphoma ([name])   Pneumothorax, acute   Healthcare-associated pneumonia   COVID-19 virus infection   1. Acute on chronic respiratory failure hypoxia we will continue with the pressure assist control currently on 40% FiO2 continue to follow 2. ARDS really no improvement from pulmonary perspective 3. Hodgkin's lymphoma at baseline 4. Pneumothorax resolved 5. Healthcare associated pneumonia treated 6. COVID-19 virus infection severe residual damage post infection   I have personally seen and evaluated the patient, evaluated laboratory and imaging results, formulated the assessment and plan and placed orders. The Patient requires high complexity decision making with multiple systems involvement.  Rounds were done with the Respiratory Therapy Director and Staff therapists and discussed with nursing staff also.  Allyne Gee, MD [facility] Pulmonary Critical Care Medicine Sleep Medicine

## 2019-09-24 ENCOUNTER — Other Ambulatory Visit (HOSPITAL_COMMUNITY): Payer: BC Managed Care – PPO

## 2019-09-24 LAB — CBC
HCT: 32 % — ABNORMAL LOW (ref 39.0–52.0)
Hemoglobin: 9.4 g/dL — ABNORMAL LOW (ref 13.0–17.0)
MCH: 29.4 pg (ref 26.0–34.0)
MCHC: 29.4 g/dL — ABNORMAL LOW (ref 30.0–36.0)
MCV: 100 fL (ref 80.0–100.0)
Platelets: 172 10*3/uL (ref 150–400)
RBC: 3.2 MIL/uL — ABNORMAL LOW (ref 4.22–5.81)
RDW: 15.4 % (ref 11.5–15.5)
WBC: 6.9 10*3/uL (ref 4.0–10.5)
nRBC: 0 % (ref 0.0–0.2)

## 2019-09-24 LAB — MAGNESIUM: Magnesium: 1.8 mg/dL (ref 1.7–2.4)

## 2019-09-24 LAB — BASIC METABOLIC PANEL
Anion gap: 5 (ref 5–15)
BUN: 25 mg/dL — ABNORMAL HIGH (ref 8–23)
CO2: 41 mmol/L — ABNORMAL HIGH (ref 22–32)
Calcium: 9.3 mg/dL (ref 8.9–10.3)
Chloride: 98 mmol/L (ref 98–111)
Creatinine, Ser: 0.33 mg/dL — ABNORMAL LOW (ref 0.61–1.24)
GFR calc Af Amer: >60 mL/min (ref >60)
GFR calc non Af Amer: >60 mL/min (ref >60)
Glucose, Bld: 191 mg/dL — ABNORMAL HIGH (ref 70–99)
Potassium: 4.3 mmol/L (ref 3.5–5.1)
Sodium: 144 mmol/L (ref 135–145)

## 2019-09-24 NOTE — Progress Notes (Signed)
Pulmonary Critical Care Medicine Steve [name]   PULMONARY CRITICAL CARE SERVICE  PROGRESS NOTE  Date of Service: 09/24/2019  HAWLEY MICHEL  XIP:[id number]  DOB: 03/04/1956   DOA: 08/28/2019  Referring Physician: Merton Border, MD  HPI: [name] is a 64 y.o. male seen for follow up of Acute on Chronic Respiratory Failure.  Patient currently is on pressure control mode has been failing spontaneous breathing trials  Medications: Reviewed on Rounds  Physical Exam:  Vitals: Temperature is 96.3 pulse 91 respiratory 28 blood pressure is 139/78 saturations 95%  Ventilator Settings on pressure control FiO2 is 40% PEEP 5 tidal volume 430 IP 28   General: Comfortable at this time  Eyes: Grossly normal lids, irises & conjunctiva  ENT: grossly tongue is normal  Neck: no obvious mass  Cardiovascular: S1 S2 normal no gallop  Respiratory: No rhonchi no rales are noted at this time  Abdomen: soft  Skin: no rash seen on limited exam  Musculoskeletal: not rigid  Psychiatric:unable to assess  Neurologic: no seizure no involuntary movements         Lab Data:   Basic Metabolic Panel: Recent Labs  Lab 09/20/19 0530 09/21/19 0724 09/22/19 0944 09/23/19 0652 09/24/19 0804  NA 149* 150* 147* 148* 144  K 3.9 4.1 4.1 4.2 4.3  CL 101 101 100 100 98  CO2 43* 38* 39* 42* 41*  GLUCOSE 169* 162* 153* 147* 191*  BUN 26* 28* 20 22 25*  CREATININE 0.35* 0.34* 0.35* 0.37* 0.33*  CALCIUM 9.6 9.4 9.7 9.7 9.3  MG  --   --   --   --  1.8    ABG: Recent Labs  Lab 09/18/19 1025  HCO3 52.1*  O2SAT 67.3    Liver Function Tests: No results for input(s): AST, ALT, ALKPHOS, BILITOT, PROT, ALBUMIN in the last 168 hours. No results for input(s): LIPASE, AMYLASE in the last 168 hours. No results for input(s): AMMONIA in the last 168 hours.  CBC: Recent Labs  Lab 09/24/19 0804  WBC 6.9  HGB 9.4*  HCT 32.0*  MCV 100.0  PLT 172    Cardiac Enzymes: No  results for input(s): CKTOTAL, CKMB, CKMBINDEX, TROPONINI in the last 168 hours.  BNP (last 3 results) Recent Labs    07/08/19 1815 07/11/19 0212 07/14/19 0458  BNP 407.1* 224.8* 55.9    ProBNP (last 3 results) No results for input(s): PROBNP in the last 8760 hours.  Radiological Exams: DG Abd 1 View  Result Date: 09/24/2019 CLINICAL DATA:  Nausea and vomiting. EXAM: ABDOMEN - 1 VIEW COMPARISON:  08/28/2019. FINDINGS: Gastrostomy tube noted with tip projected over the stomach. No gastric or bowel distention or free air. Degenerative change lumbar spine. Postsurgical changes both hips. IMPRESSION: Gastrostomy tube noted with tip over the stomach. No gastric or bowel distention. Electronically Signed   By: Marcello Moores  Register   On: 09/24/2019 14:24    Assessment/Plan Active Problems:   Acute on chronic respiratory failure with hypoxia ([name])   Acute respiratory distress syndrome (ARDS) due to COVID-19 virus ([name])   Hodgkin's lymphoma ([name])   Pneumothorax, acute   Healthcare-associated pneumonia   COVID-19 virus infection   1. Acute on chronic respiratory failure hypoxia plan is to continue with full support on the ventilator.  Patient already RSB I will be checked however patient has been consistently failing. 2. ARDS slow for improvement.  We will continue to monitor radiologically 3. Hodgkin's lymphoma at baseline 4. Pneumothorax no change  continue with supportive care 5. Healthcare associated pneumonia at baseline we will continue to follow 6. COVID-19 virus infection treated at baseline we will continue with supportive care   I have personally seen and evaluated the patient, evaluated laboratory and imaging results, formulated the assessment and plan and placed orders. The Patient requires high complexity decision making with multiple systems involvement.  Rounds were done with the Respiratory Therapy Director and Staff therapists and discussed with nursing staff also.  Allyne Gee, MD [facility] Pulmonary Critical Care Medicine Sleep Medicine

## 2019-09-25 NOTE — Progress Notes (Addendum)
Pulmonary Critical Care Medicine [place]   PULMONARY CRITICAL CARE SERVICE  PROGRESS NOTE  Date of Service: 09/25/2019  DIONNE ROSSA  HKV:[id number]  DOB: [birth year]   DOA: 08/28/2019  Referring Physician: Merton Border, MD  HPI: KJELL BRANNEN is a 64 y.o. male seen for follow up of Acute on Chronic Respiratory Failure.  Patient remains on full support ventilator at this time did do 2 hours of pressure support today before being placed back on the vent currently 50% FiO2 satting well.  Medications: Reviewed on Rounds  Physical Exam:  Vitals: Pulse 91 respirations 27 BP 112/66 O2 sat 100% temp 97.2  Ventilator Settings ventilator mode AC PC rate of 24 expiratory pressure of 28 PEEP of 5 and FiO2 50%  . General: Comfortable at this time . Eyes: Grossly normal lids, irises & conjunctiva . ENT: grossly tongue is normal . Neck: no obvious mass . Cardiovascular: S1 S2 normal no gallop . Respiratory: No wheeze or rhonchi noted . Abdomen: soft . Skin: no rash seen on limited exam . Musculoskeletal: not rigid . Psychiatric:unable to assess . Neurologic: no seizure no involuntary movements         Lab Data:   Basic Metabolic Panel: Recent Labs  Lab 09/20/19 0530 09/21/19 0724 09/22/19 0944 09/23/19 0652 09/24/19 0804  NA 149* 150* 147* 148* 144  K 3.9 4.1 4.1 4.2 4.3  CL 101 101 100 100 98  CO2 43* 38* 39* 42* 41*  GLUCOSE 169* 162* 153* 147* 191*  BUN 26* 28* 20 22 25*  CREATININE 0.35* 0.34* 0.35* 0.37* 0.33*  CALCIUM 9.6 9.4 9.7 9.7 9.3  MG  --   --   --   --  1.8    ABG: No results for input(s): PHART, PCO2ART, PO2ART, HCO3, O2SAT in the last 168 hours.  Liver Function Tests: No results for input(s): AST, ALT, ALKPHOS, BILITOT, PROT, ALBUMIN in the last 168 hours. No results for input(s): LIPASE, AMYLASE in the last 168 hours. No results for input(s): AMMONIA in the last 168 hours.  CBC: Recent Labs  Lab 09/24/19 0804  WBC 6.9   HGB 9.4*  HCT 32.0*  MCV 100.0  PLT 172    Cardiac Enzymes: No results for input(s): CKTOTAL, CKMB, CKMBINDEX, TROPONINI in the last 168 hours.  BNP (last 3 results) Recent Labs    07/08/19 1815 07/11/19 0212 07/14/19 0458  BNP 407.1* 224.8* 55.9    ProBNP (last 3 results) No results for input(s): PROBNP in the last 8760 hours.  Radiological Exams: DG Abd 1 View  Result Date: 09/24/2019 CLINICAL DATA:  Nausea and vomiting. EXAM: ABDOMEN - 1 VIEW COMPARISON:  08/28/2019. FINDINGS: Gastrostomy tube noted with tip projected over the stomach. No gastric or bowel distention or free air. Degenerative change lumbar spine. Postsurgical changes both hips. IMPRESSION: Gastrostomy tube noted with tip over the stomach. No gastric or bowel distention. Electronically Signed   By: Marcello Moores  Register   On: 09/24/2019 14:24    Assessment/Plan Active Problems:   Acute on chronic respiratory failure with hypoxia ([name])   Acute respiratory distress syndrome (ARDS) due to COVID-19 virus ([name])   Hodgkin's lymphoma ([name])   Pneumothorax, acute   Healthcare-associated pneumonia   COVID-19 virus infection   1. Acute on chronic respiratory failure hypoxia at this time continue full support on the ventilator will continue to attempt weaning.  Patient did 2 hours on pressure support so the goal tomorrow will be to do 4  hours.  Continue aggressive pulmonary toilet supportive measures. 2. ARDS slow for improvement.  We will continue to monitor radiologically 3. Hodgkin's lymphoma at baseline 4. Pneumothorax no change continue with supportive care 5. Healthcare associated pneumonia at baseline we will continue to follow 6. COVID-19 virus infection treated at baseline we will continue with supportive care   I have personally seen and evaluated the patient, evaluated laboratory and imaging results, formulated the assessment and plan and placed orders. The Patient requires high complexity decision making  with multiple systems involvement.  Rounds were done with the Respiratory Therapy Director and Staff therapists and discussed with nursing staff also.  Allyne Gee, MD [facility] Pulmonary Critical Care Medicine Sleep Medicine

## 2019-09-26 ENCOUNTER — Other Ambulatory Visit (HOSPITAL_COMMUNITY): Payer: BC Managed Care – PPO

## 2019-09-26 NOTE — Progress Notes (Addendum)
Pulmonary Critical Care Medicine [place]   PULMONARY CRITICAL CARE SERVICE  PROGRESS NOTE  Date of Service: 09/26/2019  LARANCE RATLEDGE  HBZ:[id number]  DOB: [birth year]   DOA: 08/28/2019  Referring Physician: Merton Border, MD  HPI: Steve [name] is a 64 y.o. male seen for follow up of Acute on Chronic Respiratory Failure.  Patient remains on full support at this time pressure control mode rate of 24 and FiO2 of 50% has had a low volume so is not weaning today at this time.  Was able to do 1 hour on pressure support before going back on the vent.  Medications: Reviewed on Rounds  Physical Exam:  Vitals: Pulse 82 respirations 29 BP 106/66 O2 sat 99% temp 97.2  Ventilator Settings ventilator mode AC PC rate of 24 expiratory pressure of 28 PEEP of 5 and FiO2 of 50%  . General: Comfortable at this time . Eyes: Grossly normal lids, irises & conjunctiva . ENT: grossly tongue is normal . Neck: no obvious mass . Cardiovascular: S1 S2 normal no gallop . Respiratory: No rales or rhonchi noted . Abdomen: soft . Skin: no rash seen on limited exam . Musculoskeletal: not rigid . Psychiatric:unable to assess . Neurologic: no seizure no involuntary movements         Lab Data:   Basic Metabolic Panel: Recent Labs  Lab 09/20/19 0530 09/21/19 0724 09/22/19 0944 09/23/19 0652 09/24/19 0804  NA 149* 150* 147* 148* 144  K 3.9 4.1 4.1 4.2 4.3  CL 101 101 100 100 98  CO2 43* 38* 39* 42* 41*  GLUCOSE 169* 162* 153* 147* 191*  BUN 26* 28* 20 22 25*  CREATININE 0.35* 0.34* 0.35* 0.37* 0.33*  CALCIUM 9.6 9.4 9.7 9.7 9.3  MG  --   --   --   --  1.8    ABG: No results for input(s): PHART, PCO2ART, PO2ART, HCO3, O2SAT in the last 168 hours.  Liver Function Tests: No results for input(s): AST, ALT, ALKPHOS, BILITOT, PROT, ALBUMIN in the last 168 hours. No results for input(s): LIPASE, AMYLASE in the last 168 hours. No results for input(s): AMMONIA in the last  168 hours.  CBC: Recent Labs  Lab 09/24/19 0804  WBC 6.9  HGB 9.4*  HCT 32.0*  MCV 100.0  PLT 172    Cardiac Enzymes: No results for input(s): CKTOTAL, CKMB, CKMBINDEX, TROPONINI in the last 168 hours.  BNP (last 3 results) Recent Labs    07/08/19 1815 07/11/19 0212 07/14/19 0458  BNP 407.1* 224.8* 55.9    ProBNP (last 3 results) No results for input(s): PROBNP in the last 8760 hours.  Radiological Exams: DG Chest Port 1 View  Result Date: 09/26/2019 CLINICAL DATA:  ARDS. EXAM: PORTABLE CHEST 1 VIEW COMPARISON:  Ten days ago FINDINGS: Low volume chest with coarse interstitial opacities from known pulmonary fibrosis. Tracheostomy tube and left port in good position. No evidence of effusion or pneumothorax. IMPRESSION: 1. Stable compared to 10 days ago. 2. Pulmonary fibrosis. Electronically Signed   By: Monte Fantasia M.D.   On: 09/26/2019 06:45    Assessment/Plan Active Problems:   Acute on chronic respiratory failure with hypoxia ([name])   Acute respiratory distress syndrome (ARDS) due to COVID-19 virus ([name])   Hodgkin's lymphoma ([name])   Pneumothorax, acute   Healthcare-associated pneumonia   COVID-19 virus infection   1. Acute on chronic respiratory failure hypoxia continue full support ventilator at this time we will continue to attempt weaning  protocol as tolerated.  Continue aggressive pulmonary toilet supportive measures. 2. ARDS slow for improvement. We will continue to monitor radiologically 3. Hodgkin's lymphoma at baseline 4. Pneumothorax no change continue with supportive care 5. Healthcare associated pneumonia at baseline we will continue to follow 6. COVID-19 virus infection treated at baseline we will continue with supportive care   I have personally seen and evaluated the patient, evaluated laboratory and imaging results, formulated the assessment and plan and placed orders. The Patient requires high complexity decision making with multiple systems  involvement.  Rounds were done with the Respiratory Therapy Director and Staff therapists and discussed with nursing staff also.  Allyne Gee, MD [facility] Pulmonary Critical Care Medicine Sleep Medicine

## 2019-09-27 LAB — BASIC METABOLIC PANEL
Anion gap: 5 (ref 5–15)
BUN: 18 mg/dL (ref 8–23)
CO2: 43 mmol/L — ABNORMAL HIGH (ref 22–32)
Calcium: 9.3 mg/dL (ref 8.9–10.3)
Chloride: 92 mmol/L — ABNORMAL LOW (ref 98–111)
Creatinine, Ser: 0.36 mg/dL — ABNORMAL LOW (ref 0.61–1.24)
GFR calc Af Amer: >60 mL/min (ref >60)
GFR calc non Af Amer: >60 mL/min (ref >60)
Glucose, Bld: 151 mg/dL — ABNORMAL HIGH (ref 70–99)
Potassium: 4.2 mmol/L (ref 3.5–5.1)
Sodium: 140 mmol/L (ref 135–145)

## 2019-09-27 LAB — CBC
HCT: 31 % — ABNORMAL LOW (ref 39.0–52.0)
Hemoglobin: 8.9 g/dL — ABNORMAL LOW (ref 13.0–17.0)
MCH: 28.5 pg (ref 26.0–34.0)
MCHC: 28.7 g/dL — ABNORMAL LOW (ref 30.0–36.0)
MCV: 99.4 fL (ref 80.0–100.0)
Platelets: 167 10*3/uL (ref 150–400)
RBC: 3.12 MIL/uL — ABNORMAL LOW (ref 4.22–5.81)
RDW: 15.2 % (ref 11.5–15.5)
WBC: 7.3 10*3/uL (ref 4.0–10.5)
nRBC: 0 % (ref 0.0–0.2)

## 2019-09-27 LAB — MAGNESIUM: Magnesium: 1.9 mg/dL (ref 1.7–2.4)

## 2019-09-27 NOTE — Progress Notes (Addendum)
Pulmonary Critical Care Medicine [place]   PULMONARY CRITICAL CARE SERVICE  PROGRESS NOTE  Date of Service: 09/27/2019  Steve [name]  [id number]:[id number]  DOB: Feb 13, 1956   DOA: 08/28/2019  Referring Physician: Merton Border, MD  HPI: Steve [name] is a 64 y.o. male seen for follow up of Acute on Chronic Respiratory Failure.  Patient remains on full support ventilator at this time was only able to wean for 20 minutes today currently on a rate of 24 with an FiO2 of 50%.  Medications: Reviewed on Rounds  Physical Exam:  Vitals: Pulse 85 respirations 34 BP 143/75 O2 sat 98% temp 96 9  Ventilator Settings ventilator mode AC PC rate of 24 inspiratory pressure of 28 PEEP of 5 and FiO2 of 50%  . General: Comfortable at this time . Eyes: Grossly normal lids, irises & conjunctiva . ENT: grossly tongue is normal . Neck: no obvious mass . Cardiovascular: S1 S2 normal no gallop . Respiratory: No rales or rhonchi noted . Abdomen: soft . Skin: no rash seen on limited exam . Musculoskeletal: not rigid . Psychiatric:unable to assess . Neurologic: no seizure no involuntary movements         Lab Data:   Basic Metabolic Panel: Recent Labs  Lab 09/21/19 0724 09/22/19 0944 09/23/19 0652 09/24/19 0804 09/27/19 0601  NA 150* 147* 148* 144 140  K 4.1 4.1 4.2 4.3 4.2  CL 101 100 100 98 92*  CO2 38* 39* 42* 41* 43*  GLUCOSE 162* 153* 147* 191* 151*  BUN 28* 20 22 25* 18  CREATININE 0.34* 0.35* 0.37* 0.33* 0.36*  CALCIUM 9.4 9.7 9.7 9.3 9.3  MG  --   --   --  1.8 1.9    ABG: No results for input(s): PHART, PCO2ART, PO2ART, HCO3, O2SAT in the last 168 hours.  Liver Function Tests: No results for input(s): AST, ALT, ALKPHOS, BILITOT, PROT, ALBUMIN in the last 168 hours. No results for input(s): LIPASE, AMYLASE in the last 168 hours. No results for input(s): AMMONIA in the last 168 hours.  CBC: Recent Labs  Lab 09/24/19 0804 09/27/19 0601  WBC 6.9 7.3   HGB 9.4* 8.9*  HCT 32.0* 31.0*  MCV 100.0 99.4  PLT 172 167    Cardiac Enzymes: No results for input(s): CKTOTAL, CKMB, CKMBINDEX, TROPONINI in the last 168 hours.  BNP (last 3 results) Recent Labs    07/08/19 1815 07/11/19 0212 07/14/19 0458  BNP 407.1* 224.8* 55.9    ProBNP (last 3 results) No results for input(s): PROBNP in the last 8760 hours.  Radiological Exams: DG Chest Port 1 View  Result Date: 09/26/2019 CLINICAL DATA:  ARDS. EXAM: PORTABLE CHEST 1 VIEW COMPARISON:  Ten days ago FINDINGS: Low volume chest with coarse interstitial opacities from known pulmonary fibrosis. Tracheostomy tube and left port in good position. No evidence of effusion or pneumothorax. IMPRESSION: 1. Stable compared to 10 days ago. 2. Pulmonary fibrosis. Electronically Signed   By: Monte Fantasia M.D.   On: 09/26/2019 06:45    Assessment/Plan Active Problems:   Acute on chronic respiratory failure with hypoxia ([name])   Acute respiratory distress syndrome (ARDS) due to COVID-19 virus ([name])   Hodgkin's lymphoma ([name])   Pneumothorax, acute   Healthcare-associated pneumonia   COVID-19 virus infection   1. Acute on chronic respiratory failure hypoxia  patient will continue on full support on the ventilator at this time we will continue to attempt weaning as tolerated per protocol.  Continue  aggressive pulmonary toilet supportive measures. 2. ARDS slow for improvement. We will continue to monitor radiologically 3. Hodgkin's lymphoma at baseline 4. Pneumothorax no change continue with supportive care 5. Healthcare associated pneumonia at baseline we will continue to follow 6. COVID-19 virus infection treated at baseline we will continue with supportive care   I have personally seen and evaluated the patient, evaluated laboratory and imaging results, formulated the assessment and plan and placed orders. The Patient requires high complexity decision making with multiple systems involvement.   Rounds were done with the Respiratory Therapy Director and Staff therapists and discussed with nursing staff also.  Allyne Gee, MD [facility] Pulmonary Critical Care Medicine Sleep Medicine

## 2019-09-28 NOTE — Progress Notes (Signed)
Pulmonary Critical Care Medicine [place]   PULMONARY CRITICAL CARE SERVICE  PROGRESS NOTE  Date of Service: 09/28/2019  ELISHA COOKSEY  SAY:[id number]  DOB: Apr 30, 1955   DOA: 08/28/2019  Referring Physician: Merton Border, MD  HPI: ORBY TANGEN is a 64 y.o. male seen for follow up of Acute on Chronic Respiratory Failure.  Patient remains on the ventilator continues to fail attempts at weaning.  Right now is on pressure assist control mode and is requiring 50% FiO2.  Medications: Reviewed on Rounds  Physical Exam:  Vitals: Temperature is 97.6 pulse 90 respiratory rate 28 blood pressure is 119/79 saturations 96%  Ventilator Settings on pressure assist control FiO2 50% tidal volume 407 PEEP 5  . General: Comfortable at this time . Eyes: Grossly normal lids, irises & conjunctiva . ENT: grossly tongue is normal . Neck: no obvious mass . Cardiovascular: S1 S2 normal no gallop . Respiratory: No rhonchi no rales are noted at this time . Abdomen: soft . Skin: no rash seen on limited exam . Musculoskeletal: not rigid . Psychiatric:unable to assess . Neurologic: no seizure no involuntary movements         Lab Data:   Basic Metabolic Panel: Recent Labs  Lab 09/22/19 0944 09/23/19 0652 09/24/19 0804 09/27/19 0601  NA 147* 148* 144 140  K 4.1 4.2 4.3 4.2  CL 100 100 98 92*  CO2 39* 42* 41* 43*  GLUCOSE 153* 147* 191* 151*  BUN 20 22 25* 18  CREATININE 0.35* 0.37* 0.33* 0.36*  CALCIUM 9.7 9.7 9.3 9.3  MG  --   --  1.8 1.9    ABG: No results for input(s): PHART, PCO2ART, PO2ART, HCO3, O2SAT in the last 168 hours.  Liver Function Tests: No results for input(s): AST, ALT, ALKPHOS, BILITOT, PROT, ALBUMIN in the last 168 hours. No results for input(s): LIPASE, AMYLASE in the last 168 hours. No results for input(s): AMMONIA in the last 168 hours.  CBC: Recent Labs  Lab 09/24/19 0804 09/27/19 0601  WBC 6.9 7.3  HGB 9.4* 8.9*  HCT 32.0* 31.0*   MCV 100.0 99.4  PLT 172 167    Cardiac Enzymes: No results for input(s): CKTOTAL, CKMB, CKMBINDEX, TROPONINI in the last 168 hours.  BNP (last 3 results) Recent Labs    07/08/19 1815 07/11/19 0212 07/14/19 0458  BNP 407.1* 224.8* 55.9    ProBNP (last 3 results) No results for input(s): PROBNP in the last 8760 hours.  Radiological Exams: No results found.  Assessment/Plan Active Problems:   Acute on chronic respiratory failure with hypoxia ([name])   Acute respiratory distress syndrome (ARDS) due to COVID-19 virus ([name])   Hodgkin's lymphoma ([name])   Pneumothorax, acute   Healthcare-associated pneumonia   COVID-19 virus infection   1. Acute on chronic respiratory failure hypoxia we will continue with the ventilator and full support right now on pressure assist control mode.  Patient has been consistently failing attempts at weaning. 2. ARDS slow to improve we will continue to follow 3. Hodgkin's lymphoma treated followed up after discharge thorax status post chest tube placement 4. Healthcare associated pneumonia treated 5. COVID-19 virus infection in resolution phase   I have personally seen and evaluated the patient, evaluated laboratory and imaging results, formulated the assessment and plan and placed orders. The Patient requires high complexity decision making with multiple systems involvement.  Rounds were done with the Respiratory Therapy Director and Staff therapists and discussed with nursing staff also.  Jerusalem Brownstein A  Humphrey Rolls, MD [facility] Pulmonary Critical Care Medicine Sleep Medicine

## 2019-09-29 NOTE — Progress Notes (Signed)
Pulmonary Critical Care Medicine [place]   PULMONARY CRITICAL CARE SERVICE  PROGRESS NOTE  Date of Service: 09/29/2019  Steve [name]  VOZ:[id number]  DOB: 12/07/1955   DOA: 08/28/2019  Referring Physician: Merton Border, MD  HPI: Steve [name] is a 64 y.o. male seen for follow up of Acute on Chronic Respiratory Failure.  Patient was attempted on pressure support this morning however failed ablation placed back on pressure assist control as a result  Medications: Reviewed on Rounds  Physical Exam:  Vitals: Temperature is 97.4 pulse 87 respiratory 35 blood pressure is 142/89 saturations 99%  Ventilator Settings on pressure assist control FiO2 50% tidal volume 582 IP 28 PEEP 5  . General: Comfortable at this time . Eyes: Grossly normal lids, irises & conjunctiva . ENT: grossly tongue is normal . Neck: no obvious mass . Cardiovascular: S1 S2 normal no gallop . Respiratory: No rhonchi coarse breath sounds are noted . Abdomen: soft . Skin: no rash seen on limited exam . Musculoskeletal: not rigid . Psychiatric:unable to assess . Neurologic: no seizure no involuntary movements         Lab Data:   Basic Metabolic Panel: Recent Labs  Lab 09/23/19 0652 09/24/19 0804 09/27/19 0601  NA 148* 144 140  K 4.2 4.3 4.2  CL 100 98 92*  CO2 42* 41* 43*  GLUCOSE 147* 191* 151*  BUN 22 25* 18  CREATININE 0.37* 0.33* 0.36*  CALCIUM 9.7 9.3 9.3  MG  --  1.8 1.9    ABG: No results for input(s): PHART, PCO2ART, PO2ART, HCO3, O2SAT in the last 168 hours.  Liver Function Tests: No results for input(s): AST, ALT, ALKPHOS, BILITOT, PROT, ALBUMIN in the last 168 hours. No results for input(s): LIPASE, AMYLASE in the last 168 hours. No results for input(s): AMMONIA in the last 168 hours.  CBC: Recent Labs  Lab 09/24/19 0804 09/27/19 0601  WBC 6.9 7.3  HGB 9.4* 8.9*  HCT 32.0* 31.0*  MCV 100.0 99.4  PLT 172 167    Cardiac Enzymes: No results for  input(s): CKTOTAL, CKMB, CKMBINDEX, TROPONINI in the last 168 hours.  BNP (last 3 results) Recent Labs    07/08/19 1815 07/11/19 0212 07/14/19 0458  BNP 407.1* 224.8* 55.9    ProBNP (last 3 results) No results for input(s): PROBNP in the last 8760 hours.  Radiological Exams: No results found.  Assessment/Plan Active Problems:   Acute on chronic respiratory failure with hypoxia ([name])   Acute respiratory distress syndrome (ARDS) due to COVID-19 virus ([name])   Hodgkin's lymphoma ([name])   Pneumothorax, acute   Healthcare-associated pneumonia   COVID-19 virus infection   1. Acute on chronic respiratory failure with hypoxia stop the wean today patient will be placed back on pressure control mode right now is on 50% FiO2.  The IP is 28 we will continue to follow along. 2. ARDS treated we will continue with supportive care 3. Hodgkin's lymphoma advanced disease needs follow-up after discharge with oncology 4. Acute pneumothorax no change supportive care 5. Healthcare associated pneumonia treated clinically has shown no improvement in ability to wean 6. COVID-19 virus infection in recovery phase   I have personally seen and evaluated the patient, evaluated laboratory and imaging results, formulated the assessment and plan and placed orders. The Patient requires high complexity decision making with multiple systems involvement.  Rounds were done with the Respiratory Therapy Director and Staff therapists and discussed with nursing staff also.  Allyne Gee,  MD [facility] Pulmonary Critical Care Medicine Sleep Medicine

## 2019-09-30 LAB — BASIC METABOLIC PANEL
Anion gap: 6 (ref 5–15)
BUN: 12 mg/dL (ref 8–23)
CO2: 44 mmol/L — ABNORMAL HIGH (ref 22–32)
Calcium: 9.7 mg/dL (ref 8.9–10.3)
Chloride: 89 mmol/L — ABNORMAL LOW (ref 98–111)
Creatinine, Ser: 0.4 mg/dL — ABNORMAL LOW (ref 0.61–1.24)
GFR calc Af Amer: >60 mL/min (ref >60)
GFR calc non Af Amer: >60 mL/min (ref >60)
Glucose, Bld: 136 mg/dL — ABNORMAL HIGH (ref 70–99)
Potassium: 5 mmol/L (ref 3.5–5.1)
Sodium: 139 mmol/L (ref 135–145)

## 2019-09-30 LAB — CBC
HCT: 32.4 % — ABNORMAL LOW (ref 39.0–52.0)
Hemoglobin: 9.7 g/dL — ABNORMAL LOW (ref 13.0–17.0)
MCH: 29 pg (ref 26.0–34.0)
MCHC: 29.9 g/dL — ABNORMAL LOW (ref 30.0–36.0)
MCV: 96.7 fL (ref 80.0–100.0)
Platelets: 170 10*3/uL (ref 150–400)
RBC: 3.35 MIL/uL — ABNORMAL LOW (ref 4.22–5.81)
RDW: 15.3 % (ref 11.5–15.5)
WBC: 7.7 10*3/uL (ref 4.0–10.5)
nRBC: 0 % (ref 0.0–0.2)

## 2019-09-30 LAB — MAGNESIUM: Magnesium: 2 mg/dL (ref 1.7–2.4)

## 2019-09-30 NOTE — Progress Notes (Signed)
Pulmonary Critical Care Medicine [place]   PULMONARY CRITICAL CARE SERVICE  PROGRESS NOTE  Date of Service: 09/30/2019  SONNY ANTHES  IHW:[id number]  DOB: 1955/08/21   DOA: 08/28/2019  Referring Physician: Merton Border, MD  HPI: Steve [name] is a 64 y.o. male seen for follow up of Acute on Chronic Respiratory Failure.  Patient currently is on full support on pressure control has been on 45% FiO2 good saturations  Medications: Reviewed on Rounds  Physical Exam:  Vitals: Temperature is 96.9 pulse 80 respiratory 22 blood pressure is 117/76 saturations 100%  Ventilator Settings on pressure control FiO2 is 45% tidal line 390 IP 28 PEEP 5  . General: Comfortable at this time . Eyes: Grossly normal lids, irises & conjunctiva . ENT: grossly tongue is normal . Neck: no obvious mass . Cardiovascular: S1 S2 normal no gallop . Respiratory: No rhonchi coarse breath sounds . Abdomen: soft . Skin: no rash seen on limited exam . Musculoskeletal: not rigid . Psychiatric:unable to assess . Neurologic: no seizure no involuntary movements         Lab Data:   Basic Metabolic Panel: Recent Labs  Lab 09/24/19 0804 09/27/19 0601 09/30/19 0625  NA 144 140 139  K 4.3 4.2 5.0  CL 98 92* 89*  CO2 41* 43* 44*  GLUCOSE 191* 151* 136*  BUN 25* 18 12  CREATININE 0.33* 0.36* 0.40*  CALCIUM 9.3 9.3 9.7  MG 1.8 1.9 2.0    ABG: No results for input(s): PHART, PCO2ART, PO2ART, HCO3, O2SAT in the last 168 hours.  Liver Function Tests: No results for input(s): AST, ALT, ALKPHOS, BILITOT, PROT, ALBUMIN in the last 168 hours. No results for input(s): LIPASE, AMYLASE in the last 168 hours. No results for input(s): AMMONIA in the last 168 hours.  CBC: Recent Labs  Lab 09/24/19 0804 09/27/19 0601 09/30/19 0625  WBC 6.9 7.3 7.7  HGB 9.4* 8.9* 9.7*  HCT 32.0* 31.0* 32.4*  MCV 100.0 99.4 96.7  PLT 172 167 170    Cardiac Enzymes: No results for input(s):  CKTOTAL, CKMB, CKMBINDEX, TROPONINI in the last 168 hours.  BNP (last 3 results) Recent Labs    07/08/19 1815 07/11/19 0212 07/14/19 0458  BNP 407.1* 224.8* 55.9    ProBNP (last 3 results) No results for input(s): PROBNP in the last 8760 hours.  Radiological Exams: No results found.  Assessment/Plan Active Problems:   Acute on chronic respiratory failure with hypoxia ([name])   Acute respiratory distress syndrome (ARDS) due to COVID-19 virus ([name])   Hodgkin's lymphoma ([name])   Pneumothorax, acute   Healthcare-associated pneumonia   COVID-19 virus infection   1. Acute on chronic respiratory failure hypoxia we will continue with pressure control right now on 45% FiO2 will titrate accordingly patient has not been tolerating weaning.  I had a lengthy discussion with the patient's wife and they are wanting to take him home for [facility] is essentially that his insurance company is going to drop him at the end of the month and he is not eligible for Medicaid and so therefore would not be a candidate to go to skilled nursing 2. ARDS treated clinically is improving 3. Hodgkin's lymphoma no change we will continue to follow 4. Acute pneumothorax has been stable 5. Healthcare associated pneumonia treated 6. COVID-19 virus infection   I have personally seen and evaluated the patient, evaluated laboratory and imaging results, formulated the assessment and plan and placed orders. The Patient requires high complexity  decision making with multiple systems involvement.  Rounds were done with the Respiratory Therapy Director and Staff therapists and discussed with nursing staff also.  Time 35 minutes  Allyne Gee, MD [facility] Pulmonary Critical Care Medicine Sleep Medicine

## 2019-10-01 NOTE — Progress Notes (Signed)
Pulmonary Critical Care Medicine [place]   PULMONARY CRITICAL CARE SERVICE  PROGRESS NOTE  Date of Service: 10/01/2019  TYLIK TREESE  IFO:[id number]  DOB: [birth year]   DOA: 08/28/2019  Referring Physician: Merton Border, MD  HPI: Steve [name] is a 64 y.o. male seen for follow up of Acute on Chronic Respiratory Failure.  Patient continues to do poorly not tolerating any weaning.  The family is getting home training to potentially take him home.  I had a conversation with the patient's wife yesterday explained that there is a lot involved in taking care of vented patient at home.  She states that she is is capable willing to take care of him at home  Medications: Reviewed on Rounds  Physical Exam:  Vitals: Temperature is 97.3 pulse 75 respiratory rate 30 blood pressure is 108/66 saturations 100%  Ventilator Settings on pressure control FiO2 45% IP 28 tidal volume 400 PEEP 5  . General: Comfortable at this time . Eyes: Grossly normal lids, irises & conjunctiva . ENT: grossly tongue is normal . Neck: no obvious mass . Cardiovascular: S1 S2 normal no gallop . Respiratory: No rhonchi no rales are noted at this time . Abdomen: soft . Skin: no rash seen on limited exam . Musculoskeletal: not rigid . Psychiatric:unable to assess . Neurologic: no seizure no involuntary movements         Lab Data:   Basic Metabolic Panel: Recent Labs  Lab 09/27/19 0601 09/30/19 0625  NA 140 139  K 4.2 5.0  CL 92* 89*  CO2 43* 44*  GLUCOSE 151* 136*  BUN 18 12  CREATININE 0.36* 0.40*  CALCIUM 9.3 9.7  MG 1.9 2.0    ABG: No results for input(s): PHART, PCO2ART, PO2ART, HCO3, O2SAT in the last 168 hours.  Liver Function Tests: No results for input(s): AST, ALT, ALKPHOS, BILITOT, PROT, ALBUMIN in the last 168 hours. No results for input(s): LIPASE, AMYLASE in the last 168 hours. No results for input(s): AMMONIA in the last 168 hours.  CBC: Recent Labs  Lab  09/27/19 0601 09/30/19 0625  WBC 7.3 7.7  HGB 8.9* 9.7*  HCT 31.0* 32.4*  MCV 99.4 96.7  PLT 167 170    Cardiac Enzymes: No results for input(s): CKTOTAL, CKMB, CKMBINDEX, TROPONINI in the last 168 hours.  BNP (last 3 results) Recent Labs    07/08/19 1815 07/11/19 0212 07/14/19 0458  BNP 407.1* 224.8* 55.9    ProBNP (last 3 results) No results for input(s): PROBNP in the last 8760 hours.  Radiological Exams: No results found.  Assessment/Plan Active Problems:   Acute on chronic respiratory failure with hypoxia ([name])   Acute respiratory distress syndrome (ARDS) due to COVID-19 virus ([name])   Hodgkin's lymphoma ([name])   Pneumothorax, acute   Healthcare-associated pneumonia   COVID-19 virus infection   1. Acute on chronic respiratory failure hypoxia plan is to continue with full support on the ventilator is not a weaning candidate right now will be on pressure control FiO2 45% IP 28 tidal volume 400 with a PEEP of 5 2. ARDS not improving patient is in fibrotic phase we will continue to monitor 3. Hodgkin's lymphoma treated follow-up after discharge possibly 4. Acute pneumothorax resolved 5. Healthcare associated pneumonia treated with antibiotics 6. COVID-19 virus infection in resolution phase but patient has severely damaged pulmonary status secondary to the infection   I have personally seen and evaluated the patient, evaluated laboratory and imaging results, formulated the assessment and  plan and placed orders. The Patient requires high complexity decision making with multiple systems involvement.  Rounds were done with the Respiratory Therapy Director and Staff therapists and discussed with nursing staff also.  Allyne Gee, MD [facility] Pulmonary Critical Care Medicine Sleep Medicine

## 2019-10-02 LAB — BASIC METABOLIC PANEL
Anion gap: 10 (ref 5–15)
BUN: 19 mg/dL (ref 8–23)
CO2: 40 mmol/L — ABNORMAL HIGH (ref 22–32)
Calcium: 9.7 mg/dL (ref 8.9–10.3)
Chloride: 90 mmol/L — ABNORMAL LOW (ref 98–111)
Creatinine, Ser: 0.42 mg/dL — ABNORMAL LOW (ref 0.61–1.24)
GFR calc Af Amer: >60 mL/min (ref >60)
GFR calc non Af Amer: >60 mL/min (ref >60)
Glucose, Bld: 160 mg/dL — ABNORMAL HIGH (ref 70–99)
Potassium: 4 mmol/L (ref 3.5–5.1)
Sodium: 140 mmol/L (ref 135–145)

## 2019-10-02 LAB — CBC
HCT: 31.9 % — ABNORMAL LOW (ref 39.0–52.0)
Hemoglobin: 9.4 g/dL — ABNORMAL LOW (ref 13.0–17.0)
MCH: 28.4 pg (ref 26.0–34.0)
MCHC: 29.5 g/dL — ABNORMAL LOW (ref 30.0–36.0)
MCV: 96.4 fL (ref 80.0–100.0)
Platelets: 188 10*3/uL (ref 150–400)
RBC: 3.31 MIL/uL — ABNORMAL LOW (ref 4.22–5.81)
RDW: 15.5 % (ref 11.5–15.5)
WBC: 8.6 10*3/uL (ref 4.0–10.5)
nRBC: 0 % (ref 0.0–0.2)

## 2019-10-02 LAB — MAGNESIUM: Magnesium: 1.9 mg/dL (ref 1.7–2.4)

## 2019-10-02 LAB — PHOSPHORUS: Phosphorus: 4.4 mg/dL (ref 2.5–4.6)

## 2019-10-02 NOTE — Progress Notes (Signed)
Pulmonary Critical Care Medicine [place]   PULMONARY CRITICAL CARE SERVICE  PROGRESS NOTE  Date of Service: 10/02/2019  Steve [name]  AJO:[id number]  DOB: June 13, 1955   DOA: 08/28/2019  Referring Physician: Merton Border, MD  HPI: Steve [name] is a 64 y.o. male seen for follow up of Acute on Chronic Respiratory Failure.  Home training is underway patient is possibly going to be discharged in the next week or so if training is satisfactory  Medications: Reviewed on Rounds  Physical Exam:  Vitals: Temperature is 97.1 pulse 99 respiratory rate 32 blood pressure is 147/78 saturations 100%  Ventilator Settings pressure assist control FiO2 45% PEEP 5 tidal volume 410 IP 20  . General: Comfortable at this time . Eyes: Grossly normal lids, irises & conjunctiva . ENT: grossly tongue is normal . Neck: no obvious mass . Cardiovascular: S1 S2 normal no gallop . Respiratory: Coarse rhonchi noted bilaterally . Abdomen: soft . Skin: no rash seen on limited exam . Musculoskeletal: not rigid . Psychiatric:unable to assess . Neurologic: no seizure no involuntary movements         Lab Data:   Basic Metabolic Panel: Recent Labs  Lab 09/27/19 0601 09/30/19 0625 10/02/19 0432  NA 140 139 140  K 4.2 5.0 4.0  CL 92* 89* 90*  CO2 43* 44* 40*  GLUCOSE 151* 136* 160*  BUN 18 12 19   CREATININE 0.36* 0.40* 0.42*  CALCIUM 9.3 9.7 9.7  MG 1.9 2.0 1.9  PHOS  --   --  4.4    ABG: No results for input(s): PHART, PCO2ART, PO2ART, HCO3, O2SAT in the last 168 hours.  Liver Function Tests: No results for input(s): AST, ALT, ALKPHOS, BILITOT, PROT, ALBUMIN in the last 168 hours. No results for input(s): LIPASE, AMYLASE in the last 168 hours. No results for input(s): AMMONIA in the last 168 hours.  CBC: Recent Labs  Lab 09/27/19 0601 09/30/19 0625 10/02/19 0432  WBC 7.3 7.7 8.6  HGB 8.9* 9.7* 9.4*  HCT 31.0* 32.4* 31.9*  MCV 99.4 96.7 96.4  PLT 167 170  188    Cardiac Enzymes: No results for input(s): CKTOTAL, CKMB, CKMBINDEX, TROPONINI in the last 168 hours.  BNP (last 3 results) Recent Labs    07/08/19 1815 07/11/19 0212 07/14/19 0458  BNP 407.1* 224.8* 55.9    ProBNP (last 3 results) No results for input(s): PROBNP in the last 8760 hours.  Radiological Exams: No results found.  Assessment/Plan Active Problems:   Acute on chronic respiratory failure with hypoxia ([name])   Acute respiratory distress syndrome (ARDS) due to COVID-19 virus ([name])   Hodgkin's lymphoma ([name])   Pneumothorax, acute   Healthcare-associated pneumonia   COVID-19 virus infection   1. Acute on chronic respiratory failure hypoxia plan is to continue with full support on the ventilator.  Patient is not a candidate for weaning. 2. ARDS supportive care we will continue to follow 3. Hodgkin's lymphoma no change 4. Pneumothorax patient is comfortable 5. Healthcare associated pneumonia treated 6. COVID-19 virus infection in resolution phase   I have personally seen and evaluated the patient, evaluated laboratory and imaging results, formulated the assessment and plan and placed orders. The Patient requires high complexity decision making with multiple systems involvement.  Rounds were done with the Respiratory Therapy Director and Staff therapists and discussed with nursing staff also.  Allyne Gee, MD [facility] Pulmonary Critical Care Medicine Sleep Medicine

## 2019-10-03 NOTE — Progress Notes (Addendum)
Pulmonary Critical Care Medicine [place]   PULMONARY CRITICAL CARE SERVICE  PROGRESS NOTE  Date of Service: 10/03/2019  Steve [name]  [id number]:[id number]  DOB: [birth year]   DOA: 08/28/2019  Referring Physician: Merton Border, MD  HPI: Steve [name] is a 64 y.o. male seen for follow up of Acute on Chronic Respiratory Failure. Patient mains on full support on the ventilator at this time failing wean today. Currently on a rate of 24 and FiO2 of 45% satting well.  Medications: Reviewed on Rounds  Physical Exam:  Vitals: Pulse 81 respirations 34 BP 118/71 O2 sat 100% temp 96.6  Ventilator Settings ventilator mode AC PC rate of 24 respiratory pressure of 28 PEEP of 5 and FiO2 45%  . General: Comfortable at this time . Eyes: Grossly normal lids, irises & conjunctiva . ENT: grossly tongue is normal . Neck: no obvious mass . Cardiovascular: S1 S2 normal no gallop . Respiratory: No rales or rhonchi noted . Abdomen: soft . Skin: no rash seen on limited exam . Musculoskeletal: not rigid . Psychiatric:unable to assess . Neurologic: no seizure no involuntary movements         Lab Data:   Basic Metabolic Panel: Recent Labs  Lab 09/27/19 0601 09/30/19 0625 10/02/19 0432  NA 140 139 140  K 4.2 5.0 4.0  CL 92* 89* 90*  CO2 43* 44* 40*  GLUCOSE 151* 136* 160*  BUN 18 12 19   CREATININE 0.36* 0.40* 0.42*  CALCIUM 9.3 9.7 9.7  MG 1.9 2.0 1.9  PHOS  --   --  4.4    ABG: No results for input(s): PHART, PCO2ART, PO2ART, HCO3, O2SAT in the last 168 hours.  Liver Function Tests: No results for input(s): AST, ALT, ALKPHOS, BILITOT, PROT, ALBUMIN in the last 168 hours. No results for input(s): LIPASE, AMYLASE in the last 168 hours. No results for input(s): AMMONIA in the last 168 hours.  CBC: Recent Labs  Lab 09/27/19 0601 09/30/19 0625 10/02/19 0432  WBC 7.3 7.7 8.6  HGB 8.9* 9.7* 9.4*  HCT 31.0* 32.4* 31.9*  MCV 99.4 96.7 96.4  PLT 167 170 188     Cardiac Enzymes: No results for input(s): CKTOTAL, CKMB, CKMBINDEX, TROPONINI in the last 168 hours.  BNP (last 3 results) Recent Labs    07/08/19 1815 07/11/19 0212 07/14/19 0458  BNP 407.1* 224.8* 55.9    ProBNP (last 3 results) No results for input(s): PROBNP in the last 8760 hours.  Radiological Exams: No results found.  Assessment/Plan Active Problems:   Acute on chronic respiratory failure with hypoxia ([name])   Acute respiratory distress syndrome (ARDS) due to COVID-19 virus ([name])   Hodgkin's lymphoma ([name])   Pneumothorax, acute   Healthcare-associated pneumonia   COVID-19 virus infection   1. Acute on chronic respiratory failure hypoxia continue full support on the ventilator at this time. Patient is not a candidate at this time for weaning. We will continue supportive measures and pulmonary toilet. 2. ARDS supportive care we will continue to follow 3. Hodgkin's lymphoma no change 4. Pneumothorax patient is comfortable 5. Healthcare associated pneumonia treated 6. COVID-19 virus infection in resolution phase   I have personally seen and evaluated the patient, evaluated laboratory and imaging results, formulated the assessment and plan and placed orders. The Patient requires high complexity decision making with multiple systems involvement.  Rounds were done with the Respiratory Therapy Director and Staff therapists and discussed with nursing staff also.  Allyne Gee, MD [facility]  Pulmonary Critical Care Medicine Sleep Medicine

## 2019-10-04 ENCOUNTER — Other Ambulatory Visit (HOSPITAL_COMMUNITY): Payer: BC Managed Care – PPO

## 2019-10-04 LAB — CBC
HCT: 32.6 % — ABNORMAL LOW (ref 39.0–52.0)
Hemoglobin: 9.6 g/dL — ABNORMAL LOW (ref 13.0–17.0)
MCH: 28.3 pg (ref 26.0–34.0)
MCHC: 29.4 g/dL — ABNORMAL LOW (ref 30.0–36.0)
MCV: 96.2 fL (ref 80.0–100.0)
Platelets: 163 10*3/uL (ref 150–400)
RBC: 3.39 MIL/uL — ABNORMAL LOW (ref 4.22–5.81)
RDW: 15.3 % (ref 11.5–15.5)
WBC: 10.9 10*3/uL — ABNORMAL HIGH (ref 4.0–10.5)
nRBC: 0 % (ref 0.0–0.2)

## 2019-10-04 LAB — BASIC METABOLIC PANEL WITH GFR
Anion gap: 9 (ref 5–15)
BUN: 16 mg/dL (ref 8–23)
CO2: 39 mmol/L — ABNORMAL HIGH (ref 22–32)
Calcium: 9.5 mg/dL (ref 8.9–10.3)
Chloride: 90 mmol/L — ABNORMAL LOW (ref 98–111)
Creatinine, Ser: 0.39 mg/dL — ABNORMAL LOW (ref 0.61–1.24)
GFR calc Af Amer: >60 mL/min
GFR calc non Af Amer: >60 mL/min
Glucose, Bld: 160 mg/dL — ABNORMAL HIGH (ref 70–99)
Potassium: 4.1 mmol/L (ref 3.5–5.1)
Sodium: 138 mmol/L (ref 135–145)

## 2019-10-04 LAB — PHOSPHORUS: Phosphorus: 4.3 mg/dL (ref 2.5–4.6)

## 2019-10-04 LAB — MAGNESIUM: Magnesium: 1.8 mg/dL (ref 1.7–2.4)

## 2019-10-04 NOTE — Progress Notes (Addendum)
Pulmonary Critical Care Medicine [place]   PULMONARY CRITICAL CARE SERVICE  PROGRESS NOTE  Date of Service: 10/04/2019  ASTOR GENTLE  KYH:[id number]  DOB: 1955-09-24   DOA: 08/28/2019  Referring Physician: Merton Border, MD  HPI: PARLEY PIDCOCK is a 64 y.o. male seen for follow up of Acute on Chronic Respiratory Failure. Patient continues on full support present, rate 24 and FiO2 of 40%.well this time.  Medications: Reviewed on Rounds  Physical Exam:  Vitals: Pulse 87 respirations 34 BP 147/95 O2 sat 97% temp 97.4  Ventilator Settings ventilator mode AC PC rate of 24 respiratory pressure 28 PEEP of 5 and FiO2 of 40%  . General: Comfortable at this time . Eyes: Grossly normal lids, irises & conjunctiva . ENT: grossly tongue is normal . Neck: no obvious mass . Cardiovascular: S1 S2 normal no gallop . Respiratory: No rales or rhonchi noted . Abdomen: soft . Skin: no rash seen on limited exam . Musculoskeletal: not rigid . Psychiatric:unable to assess . Neurologic: no seizure no involuntary movements         Lab Data:   Basic Metabolic Panel: Recent Labs  Lab 09/30/19 0625 10/02/19 0432 10/04/19 0641  NA 139 140 138  K 5.0 4.0 4.1  CL 89* 90* 90*  CO2 44* 40* 39*  GLUCOSE 136* 160* 160*  BUN 12 19 16   CREATININE 0.40* 0.42* 0.39*  CALCIUM 9.7 9.7 9.5  MG 2.0 1.9 1.8  PHOS  --  4.4 4.3    ABG: No results for input(s): PHART, PCO2ART, PO2ART, HCO3, O2SAT in the last 168 hours.  Liver Function Tests: No results for input(s): AST, ALT, ALKPHOS, BILITOT, PROT, ALBUMIN in the last 168 hours. No results for input(s): LIPASE, AMYLASE in the last 168 hours. No results for input(s): AMMONIA in the last 168 hours.  CBC: Recent Labs  Lab 09/30/19 0625 10/02/19 0432 10/04/19 0641  WBC 7.7 8.6 10.9*  HGB 9.7* 9.4* 9.6*  HCT 32.4* 31.9* 32.6*  MCV 96.7 96.4 96.2  PLT 170 188 163    Cardiac Enzymes: No results for input(s): CKTOTAL,  CKMB, CKMBINDEX, TROPONINI in the last 168 hours.  BNP (last 3 results) Recent Labs    07/08/19 1815 07/11/19 0212 07/14/19 0458  BNP 407.1* 224.8* 55.9    ProBNP (last 3 results) No results for input(s): PROBNP in the last 8760 hours.  Radiological Exams: DG CHEST PORT 1 VIEW  Result Date: 10/04/2019 CLINICAL DATA:  Pneumonia. EXAM: PORTABLE CHEST 1 VIEW COMPARISON:  One-view chest x-ray 09/26/2019 FINDINGS: The heart is enlarged. Diffuse interstitial and airspace disease demonstrates some progression. Disease is greatest at the left lung base and lateral right lung. Tracheostomy tube is stable. Left IJ Port-A-Cath is stable. Bowel gas pattern is normal. IMPRESSION: 1. Slight progression of diffuse interstitial and airspace disease. 2. Stable cardiomegaly. Electronically Signed   By: San Morelle M.D.   On: 10/04/2019 06:28    Assessment/Plan Active Problems:   Acute on chronic respiratory failure with hypoxia ([name])   Acute respiratory distress syndrome (ARDS) due to COVID-19 virus ([name])   Hodgkin's lymphoma ([name])   Pneumothorax, acute   Healthcare-associated pneumonia   COVID-19 virus infection   1. Acute on chronic respiratory failure hypoxia patient is still not a candidate for weaning will continue on full support at this time. Continue supportive measures and pulmonary toilet. 2. ARDS supportive care we will continue to follow 3. Hodgkin's lymphoma no change 4. Pneumothorax patient is comfortable  5. Healthcare associated pneumonia treated 6. COVID-19 virus infection in resolution phase   I have personally seen and evaluated the patient, evaluated laboratory and imaging results, formulated the assessment and plan and placed orders. The Patient requires high complexity decision making with multiple systems involvement.  Rounds were done with the Respiratory Therapy Director and Staff therapists and discussed with nursing staff also.  Allyne Gee, MD  [facility] Pulmonary Critical Care Medicine Sleep Medicine

## 2019-10-05 NOTE — Progress Notes (Addendum)
Pulmonary Critical Care Medicine [place]   PULMONARY CRITICAL CARE SERVICE  PROGRESS NOTE  Date of Service: 10/05/2019  Steve [name]  VWU:[id number]  DOB: 05/11/1955   DOA: 08/28/2019  Referring Physician: Merton Border, MD  HPI: Steve [name] is a 64 y.o. male seen for follow up of Acute on Chronic Respiratory Failure. Patient continues on 40% FiO2 satting well no fever or distress.  Medications: Reviewed on Rounds  Physical Exam:  Vitals: Pulse 75 respirations 26 BP 120/68 O2 sat 100% temp 96.3  Ventilator Settings ventilator mode pressure control mode rate 24 respiratory pressure 28 PEEP of 5 and FiO2 40%  . General: Comfortable at this time . Eyes: Grossly normal lids, irises & conjunctiva . ENT: grossly tongue is normal . Neck: no obvious mass . Cardiovascular: S1 S2 normal no gallop . Respiratory: No rales or rhonchi noted . Abdomen: soft . Skin: no rash seen on limited exam . Musculoskeletal: not rigid . Psychiatric:unable to assess . Neurologic: no seizure no involuntary movements         Lab Data:   Basic Metabolic Panel: Recent Labs  Lab 09/30/19 0625 10/02/19 0432 10/04/19 0641  NA 139 140 138  K 5.0 4.0 4.1  CL 89* 90* 90*  CO2 44* 40* 39*  GLUCOSE 136* 160* 160*  BUN 12 19 16   CREATININE 0.40* 0.42* 0.39*  CALCIUM 9.7 9.7 9.5  MG 2.0 1.9 1.8  PHOS  --  4.4 4.3    ABG: No results for input(s): PHART, PCO2ART, PO2ART, HCO3, O2SAT in the last 168 hours.  Liver Function Tests: No results for input(s): AST, ALT, ALKPHOS, BILITOT, PROT, ALBUMIN in the last 168 hours. No results for input(s): LIPASE, AMYLASE in the last 168 hours. No results for input(s): AMMONIA in the last 168 hours.  CBC: Recent Labs  Lab 09/30/19 0625 10/02/19 0432 10/04/19 0641  WBC 7.7 8.6 10.9*  HGB 9.7* 9.4* 9.6*  HCT 32.4* 31.9* 32.6*  MCV 96.7 96.4 96.2  PLT 170 188 163    Cardiac Enzymes: No results for input(s): CKTOTAL, CKMB,  CKMBINDEX, TROPONINI in the last 168 hours.  BNP (last 3 results) Recent Labs    07/08/19 1815 07/11/19 0212 07/14/19 0458  BNP 407.1* 224.8* 55.9    ProBNP (last 3 results) No results for input(s): PROBNP in the last 8760 hours.  Radiological Exams: DG CHEST PORT 1 VIEW  Result Date: 10/04/2019 CLINICAL DATA:  Pneumonia. EXAM: PORTABLE CHEST 1 VIEW COMPARISON:  One-view chest x-ray 09/26/2019 FINDINGS: The heart is enlarged. Diffuse interstitial and airspace disease demonstrates some progression. Disease is greatest at the left lung base and lateral right lung. Tracheostomy tube is stable. Left IJ Port-A-Cath is stable. Bowel gas pattern is normal. IMPRESSION: 1. Slight progression of diffuse interstitial and airspace disease. 2. Stable cardiomegaly. Electronically Signed   By: San Morelle M.D.   On: 10/04/2019 06:28    Assessment/Plan Active Problems:   Acute on chronic respiratory failure with hypoxia ([name])   Acute respiratory distress syndrome (ARDS) due to COVID-19 virus ([name])   Hodgkin's lymphoma ([name])   Pneumothorax, acute   Healthcare-associated pneumonia   COVID-19 virus infection   1. Acute on chronic respiratory failure hypoxia plan is to continue with full support on the ventilator.  Patient is not a candidate for weaning. 2. ARDS supportive care we will continue to follow 3. Hodgkin's lymphoma no change 4. Pneumothorax patient is comfortable 5. Healthcare associated pneumonia treated 6. COVID-19 virus  infection in resolution phase   I have personally seen and evaluated the patient, evaluated laboratory and imaging results, formulated the assessment and plan and placed orders. The Patient requires high complexity decision making with multiple systems involvement.  Rounds were done with the Respiratory Therapy Director and Staff therapists and discussed with nursing staff also.  Allyne Gee, MD [facility] Pulmonary Critical Care Medicine Sleep Medicine

## 2019-10-06 ENCOUNTER — Other Ambulatory Visit: Payer: Self-pay | Admitting: Medical

## 2019-10-06 NOTE — Progress Notes (Addendum)
Pulmonary Critical Care Medicine [place]   PULMONARY CRITICAL CARE SERVICE  PROGRESS NOTE  Date of Service: 10/06/2019  Steve [name]  [id number]:[id number]  DOB: February 12, 1956   DOA: 08/28/2019  Referring Physician: Merton Border, MD  HPI: Steve [name] is a 64 y.o. male seen for follow up of Acute on Chronic Respiratory Failure.  Patient remains on full support on the ventilator currently on a rate of 24 with an FiO2 of 40% satting well no fever or distress.  Medications: Reviewed on Rounds  Physical Exam:  Vitals: Pulse 81 respirations 30 BP 130/79 O2 sat 98% temp 97 point  Ventilator Settings ventilator mode AC PC rate of 24 expiratory pressure of 28 PEEP of 5 and FiO2 of 40%  . General: Comfortable at this time . Eyes: Grossly normal lids, irises & conjunctiva . ENT: grossly tongue is normal . Neck: no obvious mass . Cardiovascular: S1 S2 normal no gallop . Respiratory: No rales or rhonchi noted . Abdomen: soft . Skin: no rash seen on limited exam . Musculoskeletal: not rigid . Psychiatric:unable to assess . Neurologic: no seizure no involuntary movements         Lab Data:   Basic Metabolic Panel: Recent Labs  Lab 09/30/19 0625 10/02/19 0432 10/04/19 0641  NA 139 140 138  K 5.0 4.0 4.1  CL 89* 90* 90*  CO2 44* 40* 39*  GLUCOSE 136* 160* 160*  BUN 12 19 16   CREATININE 0.40* 0.42* 0.39*  CALCIUM 9.7 9.7 9.5  MG 2.0 1.9 1.8  PHOS  --  4.4 4.3    ABG: No results for input(s): PHART, PCO2ART, PO2ART, HCO3, O2SAT in the last 168 hours.  Liver Function Tests: No results for input(s): AST, ALT, ALKPHOS, BILITOT, PROT, ALBUMIN in the last 168 hours. No results for input(s): LIPASE, AMYLASE in the last 168 hours. No results for input(s): AMMONIA in the last 168 hours.  CBC: Recent Labs  Lab 09/30/19 0625 10/02/19 0432 10/04/19 0641  WBC 7.7 8.6 10.9*  HGB 9.7* 9.4* 9.6*  HCT 32.4* 31.9* 32.6*  MCV 96.7 96.4 96.2  PLT 170 188 163     Cardiac Enzymes: No results for input(s): CKTOTAL, CKMB, CKMBINDEX, TROPONINI in the last 168 hours.  BNP (last 3 results) Recent Labs    07/08/19 1815 07/11/19 0212 07/14/19 0458  BNP 407.1* 224.8* 55.9    ProBNP (last 3 results) No results for input(s): PROBNP in the last 8760 hours.  Radiological Exams: No results found.  Assessment/Plan Active Problems:   Acute on chronic respiratory failure with hypoxia ([name])   Acute respiratory distress syndrome (ARDS) due to COVID-19 virus ([name])   Hodgkin's lymphoma ([name])   Pneumothorax, acute   Healthcare-associated pneumonia   COVID-19 virus infection   1. Acute on chronic respiratory failure hypoxia patient remains unable to wean.  We will continue on full support on the ventilator at this time continue aggressive pulmonary toilet supportive measures. 2. ARDS supportive care we will continue to follow 3. Hodgkin's lymphoma no change 4. Pneumothorax patient is comfortable 5. Healthcare associated pneumonia treated 6. COVID-19 virus infection in resolution phase   I have personally seen and evaluated the patient, evaluated laboratory and imaging results, formulated the assessment and plan and placed orders. The Patient requires high complexity decision making with multiple systems involvement.  Rounds were done with the Respiratory Therapy Director and Staff therapists and discussed with nursing staff also.  Allyne Gee, MD [facility] Pulmonary Critical Care  Medicine Sleep Medicine

## 2019-10-07 LAB — BASIC METABOLIC PANEL
Anion gap: 8 (ref 5–15)
BUN: 16 mg/dL (ref 8–23)
CO2: 38 mmol/L — ABNORMAL HIGH (ref 22–32)
Calcium: 9.3 mg/dL (ref 8.9–10.3)
Chloride: 91 mmol/L — ABNORMAL LOW (ref 98–111)
Creatinine, Ser: 0.44 mg/dL — ABNORMAL LOW (ref 0.61–1.24)
GFR calc Af Amer: >60 mL/min (ref >60)
GFR calc non Af Amer: >60 mL/min (ref >60)
Glucose, Bld: 167 mg/dL — ABNORMAL HIGH (ref 70–99)
Potassium: 4 mmol/L (ref 3.5–5.1)
Sodium: 137 mmol/L (ref 135–145)

## 2019-10-07 LAB — CBC
HCT: 34 % — ABNORMAL LOW (ref 39.0–52.0)
Hemoglobin: 10.1 g/dL — ABNORMAL LOW (ref 13.0–17.0)
MCH: 28.4 pg (ref 26.0–34.0)
MCHC: 29.7 g/dL — ABNORMAL LOW (ref 30.0–36.0)
MCV: 95.5 fL (ref 80.0–100.0)
Platelets: 180 10*3/uL (ref 150–400)
RBC: 3.56 MIL/uL — ABNORMAL LOW (ref 4.22–5.81)
RDW: 15.1 % (ref 11.5–15.5)
WBC: 9.5 10*3/uL (ref 4.0–10.5)
nRBC: 0 % (ref 0.0–0.2)

## 2019-10-07 LAB — PHOSPHORUS: Phosphorus: 4.3 mg/dL (ref 2.5–4.6)

## 2019-10-07 LAB — MAGNESIUM: Magnesium: 1.9 mg/dL (ref 1.7–2.4)

## 2019-10-07 NOTE — Progress Notes (Addendum)
Pulmonary Critical Care Medicine [place]   PULMONARY CRITICAL CARE SERVICE  PROGRESS NOTE  Date of Service: 10/07/2019  TYSIN SALADA  KAJ:[id number]  DOB: February 15, 1956   DOA: 08/28/2019  Referring Physician: Merton Border, MD  HPI: Steve [name] is a 64 y.o. male seen for follow up of Acute on Chronic Respiratory Failure.  Patient mains on full support ventilator at this time satting well currently on a rate of 24 with an FiO2 of 35%  Medications: Reviewed on Rounds  Physical Exam:  Vitals: Pulse 96 respirations 32 BP 121/75 O2 sat 98% temp 97.3  Ventilator Settings ventilator mode AC PC rate of 24 expiratory pressure of 28 PEEP of 5 and FiO2 35%  . General: Comfortable at this time . Eyes: Grossly normal lids, irises & conjunctiva . ENT: grossly tongue is normal . Neck: no obvious mass . Cardiovascular: S1 S2 normal no gallop . Respiratory: No rales or rhonchi noted . Abdomen: soft . Skin: no rash seen on limited exam . Musculoskeletal: not rigid . Psychiatric:unable to assess . Neurologic: no seizure no involuntary movements         Lab Data:   Basic Metabolic Panel: Recent Labs  Lab 10/02/19 0432 10/04/19 0641 10/07/19 0645  NA 140 138 137  K 4.0 4.1 4.0  CL 90* 90* 91*  CO2 40* 39* 38*  GLUCOSE 160* 160* 167*  BUN 19 16 16   CREATININE 0.42* 0.39* 0.44*  CALCIUM 9.7 9.5 9.3  MG 1.9 1.8 1.9  PHOS 4.4 4.3 4.3    ABG: No results for input(s): PHART, PCO2ART, PO2ART, HCO3, O2SAT in the last 168 hours.  Liver Function Tests: No results for input(s): AST, ALT, ALKPHOS, BILITOT, PROT, ALBUMIN in the last 168 hours. No results for input(s): LIPASE, AMYLASE in the last 168 hours. No results for input(s): AMMONIA in the last 168 hours.  CBC: Recent Labs  Lab 10/02/19 0432 10/04/19 0641 10/07/19 0645  WBC 8.6 10.9* 9.5  HGB 9.4* 9.6* 10.1*  HCT 31.9* 32.6* 34.0*  MCV 96.4 96.2 95.5  PLT 188 163 180    Cardiac Enzymes: No  results for input(s): CKTOTAL, CKMB, CKMBINDEX, TROPONINI in the last 168 hours.  BNP (last 3 results) Recent Labs    07/08/19 1815 07/11/19 0212 07/14/19 0458  BNP 407.1* 224.8* 55.9    ProBNP (last 3 results) No results for input(s): PROBNP in the last 8760 hours.  Radiological Exams: No results found.  Assessment/Plan Active Problems:   Acute on chronic respiratory failure with hypoxia ([name])   Acute respiratory distress syndrome (ARDS) due to COVID-19 virus ([name])   Hodgkin's lymphoma ([name])   Pneumothorax, acute   Healthcare-associated pneumonia   COVID-19 virus infection   1. Acute on chronic respiratory failure hypoxia patient remains unable to wean.  We will continue on full support on the ventilator at this time continue aggressive pulmonary toilet supportive measures. 2. ARDS supportive care we will continue to follow 3. Hodgkin's lymphoma no change 4. Pneumothorax patient is comfortable 5. Healthcare associated pneumonia treated 6. COVID-19 virus infection in resolution phase   I have personally seen and evaluated the patient, evaluated laboratory and imaging results, formulated the assessment and plan and placed orders. The Patient requires high complexity decision making with multiple systems involvement.  Rounds were done with the Respiratory Therapy Director and Staff therapists and discussed with nursing staff also.  Allyne Gee, MD [facility] Pulmonary Critical Care Medicine Sleep Medicine

## 2019-10-08 LAB — CULTURE, RESPIRATORY W GRAM STAIN: Gram Stain: NONE SEEN

## 2019-10-08 NOTE — Progress Notes (Addendum)
Pulmonary Critical Care Medicine [place]   PULMONARY CRITICAL CARE SERVICE  PROGRESS NOTE  Date of Service: 10/08/2019  Steve [name]  [id number]  DOB: 1956/02/22   DOA: 08/28/2019  Referring Physician: Merton Border, MD  HPI: Steve [name] is a 64 y.o. male seen for follow up of Acute on Chronic Respiratory Failure.  Patient is unable to wean and most likely will go home on the vent.  Currently on a rate of 24 with an FiO2 of 45% satting well.  Medications: Reviewed on Rounds  Physical Exam:  Vitals: Pulse 90 respirations 30 BP 143/78 O2 sat 96% temp 97.2  Ventilator Settings ventilator mode AC PC rate of 24 expiratory pressure 28 PEEP of 5 and FiO2 of 45%  . General: Comfortable at this time . Eyes: Grossly normal lids, irises & conjunctiva . ENT: grossly tongue is normal . Neck: no obvious mass . Cardiovascular: S1 S2 normal no gallop . Respiratory: No rales or rhonchi noted . Abdomen: soft . Skin: no rash seen on limited exam . Musculoskeletal: not rigid . Psychiatric:unable to assess . Neurologic: no seizure no involuntary movements         Lab Data:   Basic Metabolic Panel: Recent Labs  Lab 10/02/19 0432 10/04/19 0641 10/07/19 0645  NA 140 138 137  K 4.0 4.1 4.0  CL 90* 90* 91*  CO2 40* 39* 38*  GLUCOSE 160* 160* 167*  BUN 19 16 16   CREATININE 0.42* 0.39* 0.44*  CALCIUM 9.7 9.5 9.3  MG 1.9 1.8 1.9  PHOS 4.4 4.3 4.3    ABG: No results for input(s): PHART, PCO2ART, PO2ART, HCO3, O2SAT in the last 168 hours.  Liver Function Tests: No results for input(s): AST, ALT, ALKPHOS, BILITOT, PROT, ALBUMIN in the last 168 hours. No results for input(s): LIPASE, AMYLASE in the last 168 hours. No results for input(s): AMMONIA in the last 168 hours.  CBC: Recent Labs  Lab 10/02/19 0432 10/04/19 0641 10/07/19 0645  WBC 8.6 10.9* 9.5  HGB 9.4* 9.6* 10.1*  HCT 31.9* 32.6* 34.0*  MCV 96.4 96.2 95.5  PLT 188 163 180     Cardiac Enzymes: No results for input(s): CKTOTAL, CKMB, CKMBINDEX, TROPONINI in the last 168 hours.  BNP (last 3 results) Recent Labs    07/08/19 1815 07/11/19 0212 07/14/19 0458  BNP 407.1* 224.8* 55.9    ProBNP (last 3 results) No results for input(s): PROBNP in the last 8760 hours.  Radiological Exams: No results found.  Assessment/Plan Active Problems:   Acute on chronic respiratory failure with hypoxia ([name])   Acute respiratory distress syndrome (ARDS) due to COVID-19 virus ([name])   Hodgkin's lymphoma ([name])   Pneumothorax, acute   Healthcare-associated pneumonia   COVID-19 virus infection   1. Acute on chronic respiratory failure hypoxia plan is to continue with supportive measures at this time patient remains on full support on the vent.  Unable to wean.  Continue supportive measures and pulmonary toilet. 2. ARDS supportive care we will continue to follow 3. Hodgkin's lymphoma no change 4. Pneumothorax patient is comfortable 5. Healthcare associated pneumonia treated 6. COVID-19 virus infection in resolution phase   I have personally seen and evaluated the patient, evaluated laboratory and imaging results, formulated the assessment and plan and placed orders. The Patient requires high complexity decision making with multiple systems involvement.  Rounds were done with the Respiratory Therapy Director and Staff therapists and discussed with nursing staff also.  Allyne Gee, MD  [facility] Pulmonary Critical Care Medicine Sleep Medicine

## 2019-10-09 NOTE — Progress Notes (Addendum)
Pulmonary Critical Care Medicine [place]   PULMONARY CRITICAL CARE SERVICE  PROGRESS NOTE  Date of Service: 10/09/2019  OBERON HEHIR  [id number]  DOB: 1955/09/24   DOA: 08/28/2019  Referring Physician: Merton Border, MD  HPI: Steve [name] is a 64 y.o. male seen for follow up of Acute on Chronic Respiratory Failure.  Patient remains on full support on [facility] PC rate of 24 with an expiratory pressure of 28 FiO2 of 45% currently satting well at this time.  Medications: Reviewed on Rounds  Physical Exam:  Vitals: Pulse 87 respirations 30 BP 96/55 O2 sat 99% temp 97.8  Ventilator Settings ventilator mode AC PC rate of 24 respiratory pressure of 28 PEEP of 5 and FiO2 of 45%  . General: Comfortable at this time . Eyes: Grossly normal lids, irises & conjunctiva . ENT: grossly tongue is normal . Neck: no obvious mass . Cardiovascular: S1 S2 normal no gallop . Respiratory: No rales or rhonchi noted . Abdomen: soft . Skin: no rash seen on limited exam . Musculoskeletal: not rigid . Psychiatric:unable to assess . Neurologic: no seizure no involuntary movements         Lab Data:   Basic Metabolic Panel: Recent Labs  Lab 10/04/19 0641 10/07/19 0645  NA 138 137  K 4.1 4.0  CL 90* 91*  CO2 39* 38*  GLUCOSE 160* 167*  BUN 16 16  CREATININE 0.39* 0.44*  CALCIUM 9.5 9.3  MG 1.8 1.9  PHOS 4.3 4.3    ABG: No results for input(s): PHART, PCO2ART, PO2ART, HCO3, O2SAT in the last 168 hours.  Liver Function Tests: No results for input(s): AST, ALT, ALKPHOS, BILITOT, PROT, ALBUMIN in the last 168 hours. No results for input(s): LIPASE, AMYLASE in the last 168 hours. No results for input(s): AMMONIA in the last 168 hours.  CBC: Recent Labs  Lab 10/04/19 0641 10/07/19 0645  WBC 10.9* 9.5  HGB 9.6* 10.1*  HCT 32.6* 34.0*  MCV 96.2 95.5  PLT 163 180    Cardiac Enzymes: No results for input(s): CKTOTAL, CKMB, CKMBINDEX, TROPONINI in the last  168 hours.  BNP (last 3 results) Recent Labs    07/08/19 1815 07/11/19 0212 07/14/19 0458  BNP 407.1* 224.8* 55.9    ProBNP (last 3 results) No results for input(s): PROBNP in the last 8760 hours.  Radiological Exams: No results found.  Assessment/Plan Active Problems:   Acute on chronic respiratory failure with hypoxia ([name])   Acute respiratory distress syndrome (ARDS) due to COVID-19 virus ([name])   Hodgkin's lymphoma ([name])   Pneumothorax, acute   Healthcare-associated pneumonia   COVID-19 virus infection   1. Acute on chronic respiratory failure hypoxia patient remains unable to wean.  We will continue on full support on the ventilator at this time continue aggressive pulmonary toilet supportive measures. 2. ARDS supportive care we will continue to follow 3. Hodgkin's lymphoma no change 4. Pneumothorax patient is comfortable 5. Healthcare associated pneumonia treated 6. COVID-19 virus infection in resolution phase   I have personally seen and evaluated the patient, evaluated laboratory and imaging results, formulated the assessment and plan and placed orders. The Patient requires high complexity decision making with multiple systems involvement.  Rounds were done with the Respiratory Therapy Director and Staff therapists and discussed with nursing staff also.  Allyne Gee, MD [facility] Pulmonary Critical Care Medicine Sleep Medicine

## 2019-10-10 NOTE — Progress Notes (Addendum)
Pulmonary Critical Care Medicine [place]   PULMONARY CRITICAL CARE SERVICE  PROGRESS NOTE  Date of Service: 10/10/2019  DEBRA CALABRETTA  ZOX:[id number]  DOB: May 17, 1955   DOA: 08/28/2019  Referring Physician: Merton Border, MD  HPI: Steve [name] is a 64 y.o. male seen for follow up of Acute on Chronic Respiratory Failure.  Patient is on full support ventilator no change at this time currently FiO2 45%.  Medications: Reviewed on Rounds  Physical Exam:  Vitals: Pulse 93 respirations 31 BP 124/93 O2 sat 100% temp 97.4  Ventilator Settings ventilator mode AC PC rate of 24 respiratory pressure 28 PEEP of 5 and FiO2 of 45%  . General: Comfortable at this time . Eyes: Grossly normal lids, irises & conjunctiva . ENT: grossly tongue is normal . Neck: no obvious mass . Cardiovascular: S1 S2 normal no gallop . Respiratory: No rales or rhonchi noted . Abdomen: soft . Skin: no rash seen on limited exam . Musculoskeletal: not rigid . Psychiatric:unable to assess . Neurologic: no seizure no involuntary movements         Lab Data:   Basic Metabolic Panel: Recent Labs  Lab 10/04/19 0641 10/07/19 0645  NA 138 137  K 4.1 4.0  CL 90* 91*  CO2 39* 38*  GLUCOSE 160* 167*  BUN 16 16  CREATININE 0.39* 0.44*  CALCIUM 9.5 9.3  MG 1.8 1.9  PHOS 4.3 4.3    ABG: No results for input(s): PHART, PCO2ART, PO2ART, HCO3, O2SAT in the last 168 hours.  Liver Function Tests: No results for input(s): AST, ALT, ALKPHOS, BILITOT, PROT, ALBUMIN in the last 168 hours. No results for input(s): LIPASE, AMYLASE in the last 168 hours. No results for input(s): AMMONIA in the last 168 hours.  CBC: Recent Labs  Lab 10/04/19 0641 10/07/19 0645  WBC 10.9* 9.5  HGB 9.6* 10.1*  HCT 32.6* 34.0*  MCV 96.2 95.5  PLT 163 180    Cardiac Enzymes: No results for input(s): CKTOTAL, CKMB, CKMBINDEX, TROPONINI in the last 168 hours.  BNP (last 3 results) Recent Labs     07/08/19 1815 07/11/19 0212 07/14/19 0458  BNP 407.1* 224.8* 55.9    ProBNP (last 3 results) No results for input(s): PROBNP in the last 8760 hours.  Radiological Exams: No results found.  Assessment/Plan Active Problems:   Acute on chronic respiratory failure with hypoxia ([name])   Acute respiratory distress syndrome (ARDS) due to COVID-19 virus ([name])   Hodgkin's lymphoma ([name])   Pneumothorax, acute   Healthcare-associated pneumonia   COVID-19 virus infection   1. Acute on chronic respiratory failure hypoxia patient remains unable to wean.  We will continue on full support on the ventilator at this time continue aggressive pulmonary toilet supportive measures. 2. ARDS supportive care we will continue to follow 3. Hodgkin's lymphoma no change 4. Pneumothorax patient is comfortable 5. Healthcare associated pneumonia treated 6. COVID-19 virus infection in resolution phase   I have personally seen and evaluated the patient, evaluated laboratory and imaging results, formulated the assessment and plan and placed orders. The Patient requires high complexity decision making with multiple systems involvement.  Rounds were done with the Respiratory Therapy Director and Staff therapists and discussed with nursing staff also.  Allyne Gee, MD [facility] Pulmonary Critical Care Medicine Sleep Medicine

## 2019-10-11 LAB — BASIC METABOLIC PANEL
Anion gap: 10 (ref 5–15)
BUN: 20 mg/dL (ref 8–23)
CO2: 38 mmol/L — ABNORMAL HIGH (ref 22–32)
Calcium: 9.5 mg/dL (ref 8.9–10.3)
Chloride: 93 mmol/L — ABNORMAL LOW (ref 98–111)
Creatinine, Ser: 0.42 mg/dL — ABNORMAL LOW (ref 0.61–1.24)
GFR calc Af Amer: >60 mL/min (ref >60)
GFR calc non Af Amer: >60 mL/min (ref >60)
Glucose, Bld: 130 mg/dL — ABNORMAL HIGH (ref 70–99)
Potassium: 4.1 mmol/L (ref 3.5–5.1)
Sodium: 141 mmol/L (ref 135–145)

## 2019-10-11 LAB — MAGNESIUM: Magnesium: 1.9 mg/dL (ref 1.7–2.4)

## 2019-10-11 LAB — CBC
HCT: 31.7 % — ABNORMAL LOW (ref 39.0–52.0)
Hemoglobin: 9.1 g/dL — ABNORMAL LOW (ref 13.0–17.0)
MCH: 27.9 pg (ref 26.0–34.0)
MCHC: 28.7 g/dL — ABNORMAL LOW (ref 30.0–36.0)
MCV: 97.2 fL (ref 80.0–100.0)
Platelets: 199 10*3/uL (ref 150–400)
RBC: 3.26 MIL/uL — ABNORMAL LOW (ref 4.22–5.81)
RDW: 15.3 % (ref 11.5–15.5)
WBC: 7.3 10*3/uL (ref 4.0–10.5)
nRBC: 0 % (ref 0.0–0.2)

## 2019-10-11 NOTE — Progress Notes (Addendum)
Pulmonary Critical Care Medicine [place]   PULMONARY CRITICAL CARE SERVICE  PROGRESS NOTE  Date of Service: 10/11/2019  LESTER CRICKENBERGER  [id number]  DOB: 10-08-1955   DOA: 08/28/2019  Referring Physician: Merton Border, MD  HPI: ABANOUB HANKEN is a 64 y.o. male seen for follow up of Acute on Chronic Respiratory Failure.  Patient remains on vent at this time full support plan is still for patient to go home on ventilator.  Currently on FiO2 of 45%  Medications: Reviewed on Rounds  Physical Exam:  Vitals: Pulse 80 respirations 33 BP 115/68 O2 sat 100% temp 97.5  Ventilator Settings ventilator mode AC PC rate of 24 expiratory pressure of 26 PEEP of 5 and FiO2 of 45%   General: Comfortable at this time  Eyes: Grossly normal lids, irises & conjunctiva  ENT: grossly tongue is normal  Neck: no obvious mass  Cardiovascular: S1 S2 normal no gallop  Respiratory: No rales or rhonchi noted  Abdomen: soft  Skin: no rash seen on limited exam  Musculoskeletal: not rigid  Psychiatric:unable to assess  Neurologic: no seizure no involuntary movements         Lab Data:   Basic Metabolic Panel: Recent Labs  Lab 10/07/19 0645 10/11/19 0546  NA 137 141  K 4.0 4.1  CL 91* 93*  CO2 38* 38*  GLUCOSE 167* 130*  BUN 16 20  CREATININE 0.44* 0.42*  CALCIUM 9.3 9.5  MG 1.9 1.9  PHOS 4.3  --     ABG: No results for input(s): PHART, PCO2ART, PO2ART, HCO3, O2SAT in the last 168 hours.  Liver Function Tests: No results for input(s): AST, ALT, ALKPHOS, BILITOT, PROT, ALBUMIN in the last 168 hours. No results for input(s): LIPASE, AMYLASE in the last 168 hours. No results for input(s): AMMONIA in the last 168 hours.  CBC: Recent Labs  Lab 10/07/19 0645 10/11/19 0546  WBC 9.5 7.3  HGB 10.1* 9.1*  HCT 34.0* 31.7*  MCV 95.5 97.2  PLT 180 199    Cardiac Enzymes: No results for input(s): CKTOTAL, CKMB, CKMBINDEX, TROPONINI in the last 168  hours.  BNP (last 3 results) Recent Labs    07/08/19 1815 07/11/19 0212 07/14/19 0458  BNP 407.1* 224.8* 55.9    ProBNP (last 3 results) No results for input(s): PROBNP in the last 8760 hours.  Radiological Exams: No results found.  Assessment/Plan Active Problems:   Acute on chronic respiratory failure with hypoxia ([name])   Acute respiratory distress syndrome (ARDS) due to COVID-19 virus ([name])   Hodgkin's lymphoma ([name])   Pneumothorax, acute   Healthcare-associated pneumonia   COVID-19 virus infection   1. Acute on chronic respiratory failure hypoxia patient remains unable to wean.  We will continue on full support on the ventilator at this time continue aggressive pulmonary toilet supportive measures. 2. ARDS supportive care we will continue to follow 3. Hodgkin's lymphoma no change 4. Pneumothorax patient is comfortable 5. Healthcare associated pneumonia treated 6. COVID-19 virus infection in resolution phase   I have personally seen and evaluated the patient, evaluated laboratory and imaging results, formulated the assessment and plan and placed orders. The Patient requires high complexity decision making with multiple systems involvement.  Rounds were done with the Respiratory Therapy Director and Staff therapists and discussed with nursing staff also.  Allyne Gee, MD [facility] Pulmonary Critical Care Medicine Sleep Medicine

## 2019-10-12 NOTE — Progress Notes (Signed)
Pulmonary Critical Care Medicine [place]   PULMONARY CRITICAL CARE SERVICE  PROGRESS NOTE  Date of Service: 10/12/2019  Steve [name]  KWI:[id number]  DOB: [birth year]   DOA: 08/28/2019  Referring Physician: Merton Border, MD  HPI: Steve [name] is a 64 y.o. male seen for follow up of Acute on Chronic Respiratory Failure.  Patient currently is on pressure control mode has been on the home ventilator requiring 6 L O2 bleeding doing fairly well with the home training going on  Medications: Reviewed on Rounds  Physical Exam:  Vitals: Temperature 98.2 pulse 92 respiratory 25 blood pressure is 145/83 saturations 94%  Ventilator Settings on pressure control mode 6 L oxygen bleed and respiratory rate 24 IP 28 PEEP 5  . General: Comfortable at this time . Eyes: Grossly normal lids, irises & conjunctiva . ENT: grossly tongue is normal . Neck: no obvious mass . Cardiovascular: S1 S2 normal no gallop . Respiratory: No rhonchi very coarse breath sounds . Abdomen: soft . Skin: no rash seen on limited exam . Musculoskeletal: not rigid . Psychiatric:unable to assess . Neurologic: no seizure no involuntary movements         Lab Data:   Basic Metabolic Panel: Recent Labs  Lab 10/07/19 0645 10/11/19 0546  NA 137 141  K 4.0 4.1  CL 91* 93*  CO2 38* 38*  GLUCOSE 167* 130*  BUN 16 20  CREATININE 0.44* 0.42*  CALCIUM 9.3 9.5  MG 1.9 1.9  PHOS 4.3  --     ABG: No results for input(s): PHART, PCO2ART, PO2ART, HCO3, O2SAT in the last 168 hours.  Liver Function Tests: No results for input(s): AST, ALT, ALKPHOS, BILITOT, PROT, ALBUMIN in the last 168 hours. No results for input(s): LIPASE, AMYLASE in the last 168 hours. No results for input(s): AMMONIA in the last 168 hours.  CBC: Recent Labs  Lab 10/07/19 0645 10/11/19 0546  WBC 9.5 7.3  HGB 10.1* 9.1*  HCT 34.0* 31.7*  MCV 95.5 97.2  PLT 180 199    Cardiac Enzymes: No results for input(s):  CKTOTAL, CKMB, CKMBINDEX, TROPONINI in the last 168 hours.  BNP (last 3 results) Recent Labs    07/08/19 1815 07/11/19 0212 07/14/19 0458  BNP 407.1* 224.8* 55.9    ProBNP (last 3 results) No results for input(s): PROBNP in the last 8760 hours.  Radiological Exams: No results found.  Assessment/Plan Active Problems:   Acute on chronic respiratory failure with hypoxia ([name])   Acute respiratory distress syndrome (ARDS) due to COVID-19 virus ([name])   Hodgkin's lymphoma ([name])   Pneumothorax, acute   Healthcare-associated pneumonia   COVID-19 virus infection   1. Acute on chronic respiratory failure with hypoxia plan is to continue with home ventilation patient's family has been trained 2. ARDS treated clinically is improving 3. Hodgkin's lymphoma change 4. Acute pneumothorax at baseline we will continue with supportive care 5. Healthcare associated pneumonia treated resolved 6. COVID-19 virus infection resolved prognosis is guarded   I have personally seen and evaluated the patient, evaluated laboratory and imaging results, formulated the assessment and plan and placed orders. The Patient requires high complexity decision making with multiple systems involvement.  Rounds were done with the Respiratory Therapy Director and Staff therapists and discussed with nursing staff also.  Allyne Gee, MD [facility] Pulmonary Critical Care Medicine Sleep Medicine

## 2019-10-13 NOTE — Progress Notes (Signed)
Pulmonary Critical Care Medicine [name]   PULMONARY CRITICAL CARE SERVICE  PROGRESS NOTE  Date of Service: 10/13/2019  DREWEY BEGUE  BSW:[id number]  DOB: 26-Mar-1956   DOA: 08/28/2019  Referring Physician: Merton Border, MD  HPI: Steve [name] is a 64 y.o. male seen for follow up of Acute on Chronic Respiratory Failure.  Patient is on the ventilator home training is underway for the family to be taking the patient home.  Appears to be comfortable without any distress has been on pressure assist control mode  Medications: Reviewed on Rounds  Physical Exam:  Vitals: Temperature is 97.8 pulse 83 respiratory rate 32 blood pressure is one 4/59 saturations 100%  Ventilator Settings on pressure assist control FiO2 45% IP 28 PEEP 5  . General: Comfortable at this time . Eyes: Grossly normal lids, irises & conjunctiva . ENT: grossly tongue is normal . Neck: no obvious mass . Cardiovascular: S1 S2 normal no gallop . Respiratory: No rhonchi coarse breath sounds are noted . Abdomen: soft . Skin: no rash seen on limited exam . Musculoskeletal: not rigid . Psychiatric:unable to assess . Neurologic: no seizure no involuntary movements         Lab Data:   Basic Metabolic Panel: Recent Labs  Lab 10/07/19 0645 10/11/19 0546  NA 137 141  K 4.0 4.1  CL 91* 93*  CO2 38* 38*  GLUCOSE 167* 130*  BUN 16 20  CREATININE 0.44* 0.42*  CALCIUM 9.3 9.5  MG 1.9 1.9  PHOS 4.3  --     ABG: No results for input(s): PHART, PCO2ART, PO2ART, HCO3, O2SAT in the last 168 hours.  Liver Function Tests: No results for input(s): AST, ALT, ALKPHOS, BILITOT, PROT, ALBUMIN in the last 168 hours. No results for input(s): LIPASE, AMYLASE in the last 168 hours. No results for input(s): AMMONIA in the last 168 hours.  CBC: Recent Labs  Lab 10/07/19 0645 10/11/19 0546  WBC 9.5 7.3  HGB 10.1* 9.1*  HCT 34.0* 31.7*  MCV 95.5 97.2  PLT 180 199    Cardiac Enzymes: No  results for input(s): CKTOTAL, CKMB, CKMBINDEX, TROPONINI in the last 168 hours.  BNP (last 3 results) Recent Labs    07/08/19 1815 07/11/19 0212 07/14/19 0458  BNP 407.1* 224.8* 55.9    ProBNP (last 3 results) No results for input(s): PROBNP in the last 8760 hours.  Radiological Exams: No results found.  Assessment/Plan Active Problems:   Acute on chronic respiratory failure with hypoxia ([name])   Acute respiratory distress syndrome (ARDS) due to COVID-19 virus ([name])   Hodgkin's lymphoma ([name])   Pneumothorax, acute   Healthcare-associated pneumonia   COVID-19 virus infection   1. Acute on chronic respiratory failure hypoxia plan is to continue with pressure assist control currently on 45% FiO2 with an IP of 28.  Patient is tolerating the home ventilator fine home training also underway. 2. ARDS treated we will continue with supportive care 3. Hodgkin's lymphoma at baseline 4. Pneumothorax resolved 5. Healthcare associated pneumonia treated and resolved 6. COVID-19 virus infection in resolution phase with severe pulmonary damage   I have personally seen and evaluated the patient, evaluated laboratory and imaging results, formulated the assessment and plan and placed orders. The Patient requires high complexity decision making with multiple systems involvement.  Rounds were done with the Respiratory Therapy Director and Staff therapists and discussed with nursing staff also.  Allyne Gee, MD [facility] Pulmonary Critical Care Medicine Sleep Medicine

## 2019-10-14 LAB — CBC
HCT: 35.1 % — ABNORMAL LOW (ref 39.0–52.0)
Hemoglobin: 10.3 g/dL — ABNORMAL LOW (ref 13.0–17.0)
MCH: 29.1 pg (ref 26.0–34.0)
MCHC: 29.3 g/dL — ABNORMAL LOW (ref 30.0–36.0)
MCV: 99.2 fL (ref 80.0–100.0)
Platelets: 192 10*3/uL (ref 150–400)
RBC: 3.54 MIL/uL — ABNORMAL LOW (ref 4.22–5.81)
RDW: 15.2 % (ref 11.5–15.5)
WBC: 9 10*3/uL (ref 4.0–10.5)
nRBC: 0 % (ref 0.0–0.2)

## 2019-10-14 LAB — BASIC METABOLIC PANEL
Anion gap: 10 (ref 5–15)
BUN: 21 mg/dL (ref 8–23)
CO2: 40 mmol/L — ABNORMAL HIGH (ref 22–32)
Calcium: 9.4 mg/dL (ref 8.9–10.3)
Chloride: 95 mmol/L — ABNORMAL LOW (ref 98–111)
Creatinine, Ser: 0.44 mg/dL — ABNORMAL LOW (ref 0.61–1.24)
GFR calc Af Amer: >60 mL/min (ref >60)
GFR calc non Af Amer: >60 mL/min (ref >60)
Glucose, Bld: 149 mg/dL — ABNORMAL HIGH (ref 70–99)
Potassium: 4 mmol/L (ref 3.5–5.1)
Sodium: 145 mmol/L (ref 135–145)

## 2019-10-14 LAB — MAGNESIUM: Magnesium: 1.9 mg/dL (ref 1.7–2.4)

## 2019-10-14 NOTE — Progress Notes (Signed)
Pulmonary Critical Care Medicine [place]   PULMONARY CRITICAL CARE SERVICE  PROGRESS NOTE  Date of Service: 10/14/2019  Steve [name]  HCW:[id number]  DOB: 08/28/1955   DOA: 08/28/2019  Referring Physician: Merton Border, MD  HPI: Steve [name] is a 64 y.o. male seen for follow up of Acute on Chronic Respiratory Failure.  Patient is on the ventilator for home training on vent management.  Right now is on pressure control mode  Medications: Reviewed on Rounds  Physical Exam:  Vitals: Temperature is 97.6 pulse 90 respiratory 27 blood pressure is 118/63 saturations 97%  Ventilator Settings on pressure assist control FiO2 45% respiratory 24 IP 28 PEEP 5  . General: Comfortable at this time . Eyes: Grossly normal lids, irises & conjunctiva . ENT: grossly tongue is normal . Neck: no obvious mass . Cardiovascular: S1 S2 normal no gallop . Respiratory: No rhonchi coarse breath sounds are noted at this time . Abdomen: soft . Skin: no rash seen on limited exam . Musculoskeletal: not rigid . Psychiatric:unable to assess . Neurologic: no seizure no involuntary movements         Lab Data:   Basic Metabolic Panel: Recent Labs  Lab 10/11/19 0546 10/14/19 0725  NA 141 145  K 4.1 4.0  CL 93* 95*  CO2 38* 40*  GLUCOSE 130* 149*  BUN 20 21  CREATININE 0.42* 0.44*  CALCIUM 9.5 9.4  MG 1.9 1.9    ABG: No results for input(s): PHART, PCO2ART, PO2ART, HCO3, O2SAT in the last 168 hours.  Liver Function Tests: No results for input(s): AST, ALT, ALKPHOS, BILITOT, PROT, ALBUMIN in the last 168 hours. No results for input(s): LIPASE, AMYLASE in the last 168 hours. No results for input(s): AMMONIA in the last 168 hours.  CBC: Recent Labs  Lab 10/11/19 0546 10/14/19 0725  WBC 7.3 9.0  HGB 9.1* 10.3*  HCT 31.7* 35.1*  MCV 97.2 99.2  PLT 199 192    Cardiac Enzymes: No results for input(s): CKTOTAL, CKMB, CKMBINDEX, TROPONINI in the last 168  hours.  BNP (last 3 results) Recent Labs    07/08/19 1815 07/11/19 0212 07/14/19 0458  BNP 407.1* 224.8* 55.9    ProBNP (last 3 results) No results for input(s): PROBNP in the last 8760 hours.  Radiological Exams: No results found.  Assessment/Plan Active Problems:   Acute on chronic respiratory failure with hypoxia ([name])   Acute respiratory distress syndrome (ARDS) due to COVID-19 virus ([name])   Hodgkin's lymphoma ([name])   Pneumothorax, acute   Healthcare-associated pneumonia   COVID-19 virus infection   1. Acute on chronic respiratory failure hypoxia on pressure assist control ventilator 24 FiO2 45% IP 28 PEEP 5 patient not tolerating weaning at all so is going to be discharged home on vent 2. Hodgkin's lymphoma supportive care will need follow-up after discharge 3. ARDS supportive care we will continue to follow 4. Acute pneumothorax patient is at baseline 5. Healthcare associated pneumonia treated we will continue to follow 6. COVID-19 virus infection in resolution phase   I have personally seen and evaluated the patient, evaluated laboratory and imaging results, formulated the assessment and plan and placed orders. The Patient requires high complexity decision making with multiple systems involvement.  Rounds were done with the Respiratory Therapy Director and Staff therapists and discussed with nursing staff also.  Allyne Gee, MD [facility] Pulmonary Critical Care Medicine Sleep Medicine

## 2019-10-15 NOTE — Progress Notes (Signed)
Pulmonary Critical Care Medicine [place]   PULMONARY CRITICAL CARE SERVICE  PROGRESS NOTE  Date of Service: 10/15/2019  Steve [name]  RUE:[id number]  DOB: 17-May-1955   DOA: 08/28/2019  Referring Physician: Merton Border, MD  HPI: Steve [name] is a 64 y.o. male seen for follow up of Acute on Chronic Respiratory Failure.  Patient currently is on the ventilator and pressure control mode has been on 40% FiO2  Medications: Reviewed on Rounds  Physical Exam:  Vitals: Temperature 97.5 pulse 83 respiratory rate 30 blood pressure is 110/70 saturations 98%  Ventilator Settings on pressure assist control FiO2 40% IP 28 PEEP 5  . General: Comfortable at this time . Eyes: Grossly normal lids, irises & conjunctiva . ENT: grossly tongue is normal . Neck: no obvious mass . Cardiovascular: S1 S2 normal no gallop . Respiratory: No rhonchi with coarse breath sounds . Abdomen: soft . Skin: no rash seen on limited exam . Musculoskeletal: not rigid . Psychiatric:unable to assess . Neurologic: no seizure no involuntary movements         Lab Data:   Basic Metabolic Panel: Recent Labs  Lab 10/11/19 0546 10/14/19 0725  NA 141 145  K 4.1 4.0  CL 93* 95*  CO2 38* 40*  GLUCOSE 130* 149*  BUN 20 21  CREATININE 0.42* 0.44*  CALCIUM 9.5 9.4  MG 1.9 1.9    ABG: No results for input(s): PHART, PCO2ART, PO2ART, HCO3, O2SAT in the last 168 hours.  Liver Function Tests: No results for input(s): AST, ALT, ALKPHOS, BILITOT, PROT, ALBUMIN in the last 168 hours. No results for input(s): LIPASE, AMYLASE in the last 168 hours. No results for input(s): AMMONIA in the last 168 hours.  CBC: Recent Labs  Lab 10/11/19 0546 10/14/19 0725  WBC 7.3 9.0  HGB 9.1* 10.3*  HCT 31.7* 35.1*  MCV 97.2 99.2  PLT 199 192    Cardiac Enzymes: No results for input(s): CKTOTAL, CKMB, CKMBINDEX, TROPONINI in the last 168 hours.  BNP (last 3 results) Recent Labs     07/08/19 1815 07/11/19 0212 07/14/19 0458  BNP 407.1* 224.8* 55.9    ProBNP (last 3 results) No results for input(s): PROBNP in the last 8760 hours.  Radiological Exams: No results found.  Assessment/Plan Active Problems:   Acute on chronic respiratory failure with hypoxia ([name])   Acute respiratory distress syndrome (ARDS) due to COVID-19 virus ([name])   Hodgkin's lymphoma ([name])   Pneumothorax, acute   Healthcare-associated pneumonia   COVID-19 virus infection   1. Acute on chronic respiratory failure with hypoxia plan is to continue with the full support on the ventilator and pressure control titrate oxygen continue pulmonary toilet. 2. ARDS treated clinically is improved we will continue to follow 3. Hodgkin's lymphoma no change 4. Pneumothorax patient is at baseline we will continue to monitor 5. Healthcare associated pneumonia treated improved 6. COVID-19 virus infection resolved with residual pulmonary damage   I have personally seen and evaluated the patient, evaluated laboratory and imaging results, formulated the assessment and plan and placed orders. The Patient requires high complexity decision making with multiple systems involvement.  Rounds were done with the Respiratory Therapy Director and Staff therapists and discussed with nursing staff also.  Allyne Gee, MD [facility] Pulmonary Critical Care Medicine Sleep Medicine

## 2019-10-16 LAB — BASIC METABOLIC PANEL
Anion gap: 8 (ref 5–15)
BUN: 14 mg/dL (ref 8–23)
CO2: 41 mmol/L — ABNORMAL HIGH (ref 22–32)
Calcium: 9.7 mg/dL (ref 8.9–10.3)
Chloride: 95 mmol/L — ABNORMAL LOW (ref 98–111)
Creatinine, Ser: 0.46 mg/dL — ABNORMAL LOW (ref 0.61–1.24)
GFR calc Af Amer: >60 mL/min (ref >60)
GFR calc non Af Amer: >60 mL/min (ref >60)
Glucose, Bld: 143 mg/dL — ABNORMAL HIGH (ref 70–99)
Potassium: 4 mmol/L (ref 3.5–5.1)
Sodium: 144 mmol/L (ref 135–145)

## 2019-10-16 LAB — PHOSPHORUS: Phosphorus: 4.7 mg/dL — ABNORMAL HIGH (ref 2.5–4.6)

## 2019-10-16 LAB — MAGNESIUM: Magnesium: 2.1 mg/dL (ref 1.7–2.4)

## 2019-10-16 NOTE — Progress Notes (Signed)
Pulmonary Critical Care Medicine Steve [name]   PULMONARY CRITICAL CARE SERVICE  PROGRESS NOTE  Date of Service: 10/16/2019  Steve [name]  KDT:[id number]  DOB: Sep 02, 1955   DOA: 08/28/2019  Referring Physician: Merton Border, MD  HPI: Steve [name] is a 64 y.o. male seen for follow up of Acute on Chronic Respiratory Failure.  Patient currently is on pressure assist control has been on 40% FiO2.  Has been on his baseline vent settings.  Awaiting discharge planning will discuss with the case management.  Medications: Reviewed on Rounds  Physical Exam:  Vitals: Temperature is 96.3 pulse 74 respiratory 30 blood pressure is 120/75 saturations 100%  Ventilator Settings on pressure assist control FiO2 40% IP 24 PEEP 5  . General: Comfortable at this time . Eyes: Grossly normal lids, irises & conjunctiva . ENT: grossly tongue is normal . Neck: no obvious mass . Cardiovascular: S1 S2 normal no gallop . Respiratory: No rhonchi coarse breath sounds . Abdomen: soft . Skin: no rash seen on limited exam . Musculoskeletal: not rigid . Psychiatric:unable to assess . Neurologic: no seizure no involuntary movements         Lab Data:   Basic Metabolic Panel: Recent Labs  Lab 10/11/19 0546 10/14/19 0725  NA 141 145  K 4.1 4.0  CL 93* 95*  CO2 38* 40*  GLUCOSE 130* 149*  BUN 20 21  CREATININE 0.42* 0.44*  CALCIUM 9.5 9.4  MG 1.9 1.9    ABG: No results for input(s): PHART, PCO2ART, PO2ART, HCO3, O2SAT in the last 168 hours.  Liver Function Tests: No results for input(s): AST, ALT, ALKPHOS, BILITOT, PROT, ALBUMIN in the last 168 hours. No results for input(s): LIPASE, AMYLASE in the last 168 hours. No results for input(s): AMMONIA in the last 168 hours.  CBC: Recent Labs  Lab 10/11/19 0546 10/14/19 0725  WBC 7.3 9.0  HGB 9.1* 10.3*  HCT 31.7* 35.1*  MCV 97.2 99.2  PLT 199 192    Cardiac Enzymes: No results for input(s): CKTOTAL, CKMB,  CKMBINDEX, TROPONINI in the last 168 hours.  BNP (last 3 results) Recent Labs    07/08/19 1815 07/11/19 0212 07/14/19 0458  BNP 407.1* 224.8* 55.9    ProBNP (last 3 results) No results for input(s): PROBNP in the last 8760 hours.  Radiological Exams: No results found.  Assessment/Plan Active Problems:   Acute on chronic respiratory failure with hypoxia ([name])   Acute respiratory distress syndrome (ARDS) due to COVID-19 virus ([name])   Hodgkin's lymphoma ([name])   Pneumothorax, acute   Healthcare-associated pneumonia   COVID-19 virus infection   1. Acute on chronic respiratory failure hypoxia patient is at baseline on the ventilator.  He is to go back on the home vent for home training and discharge. 2. ARDS no change supportive care patient with end-stage disease 3. Hodgkin's lymphoma will follow-up after discharge with oncology 4. Acute pneumothorax resolved we will continue with supportive care 5. Healthcare associated pneumonia treated clinically improved 6. COVID-19 virus infection treated resolved   I have personally seen and evaluated the patient, evaluated laboratory and imaging results, formulated the assessment and plan and placed orders. The Patient requires high complexity decision making with multiple systems involvement.  Rounds were done with the Respiratory Therapy Director and Staff therapists and discussed with nursing staff also.  Allyne Gee, MD [facility] Pulmonary Critical Care Medicine Sleep Medicine

## 2019-10-17 NOTE — Progress Notes (Signed)
Pulmonary Critical Care Medicine [place]   PULMONARY CRITICAL CARE SERVICE  PROGRESS NOTE  Date of Service: 10/17/2019  Steve [name]  ZOX:[id number]  DOB: 1955-07-23   DOA: 08/28/2019  Referring Physician: Merton Border, MD  HPI: Steve [name] is a 63 y.o. male seen for follow up of Acute on Chronic Respiratory Failure.  Patient currently is on pressure control mode on 40% FiO2 with an IP of 24  Medications: Reviewed on Rounds  Physical Exam:  Vitals: Temperature is 96.4 pulse 82 respiratory 20 blood pressure is 111/64 saturations 100%  Ventilator Settings on pressure assist control FiO2 40% IP 24 PEEP 5  . General: Comfortable at this time . Eyes: Grossly normal lids, irises & conjunctiva . ENT: grossly tongue is normal . Neck: no obvious mass . Cardiovascular: S1 S2 normal no gallop . Respiratory: No rhonchi coarse breath sounds . Abdomen: soft . Skin: no rash seen on limited exam . Musculoskeletal: not rigid . Psychiatric:unable to assess . Neurologic: no seizure no involuntary movements         Lab Data:   Basic Metabolic Panel: Recent Labs  Lab 10/11/19 0546 10/14/19 0725 10/16/19 0940  NA 141 145 144  K 4.1 4.0 4.0  CL 93* 95* 95*  CO2 38* 40* 41*  GLUCOSE 130* 149* 143*  BUN 20 21 14   CREATININE 0.42* 0.44* 0.46*  CALCIUM 9.5 9.4 9.7  MG 1.9 1.9 2.1  PHOS  --   --  4.7*    ABG: No results for input(s): PHART, PCO2ART, PO2ART, HCO3, O2SAT in the last 168 hours.  Liver Function Tests: No results for input(s): AST, ALT, ALKPHOS, BILITOT, PROT, ALBUMIN in the last 168 hours. No results for input(s): LIPASE, AMYLASE in the last 168 hours. No results for input(s): AMMONIA in the last 168 hours.  CBC: Recent Labs  Lab 10/11/19 0546 10/14/19 0725  WBC 7.3 9.0  HGB 9.1* 10.3*  HCT 31.7* 35.1*  MCV 97.2 99.2  PLT 199 192    Cardiac Enzymes: No results for input(s): CKTOTAL, CKMB, CKMBINDEX, TROPONINI in the last 168  hours.  BNP (last 3 results) Recent Labs    07/08/19 1815 07/11/19 0212 07/14/19 0458  BNP 407.1* 224.8* 55.9    ProBNP (last 3 results) No results for input(s): PROBNP in the last 8760 hours.  Radiological Exams: No results found.  Assessment/Plan Active Problems:   Acute on chronic respiratory failure with hypoxia ([name])   Acute respiratory distress syndrome (ARDS) due to COVID-19 virus ([name])   Hodgkin's lymphoma ([name])   Pneumothorax, acute   Healthcare-associated pneumonia   COVID-19 virus infection   1. Acute on chronic respiratory failure hypoxia plan is to continue with full support on the ventilator patient awaiting discharge planning 2. ARDS treated. 3. Hospital lymphoma no change 4. Pneumothorax stable continue supportive care 5. Healthcare associated pneumonia treated clinically. 6. COVID-19 virus infection resolved   I have personally seen and evaluated the patient, evaluated laboratory and imaging results, formulated the assessment and plan and placed orders. The Patient requires high complexity decision making with multiple systems involvement.  Rounds were done with the Respiratory Therapy Director and Staff therapists and discussed with nursing staff also.  Allyne Gee, MD [facility] Pulmonary Critical Care Medicine Sleep Medicine

## 2019-10-18 NOTE — Progress Notes (Signed)
Pulmonary Critical Care Medicine [place]   PULMONARY CRITICAL CARE SERVICE  PROGRESS NOTE  Date of Service: 10/18/2019  Steve [name]  GXQ:[id number]  DOB: [birth year]   DOA: 08/28/2019  Referring Physician: Merton Border, MD  HPI: DAEVEON ZWEBER is a 64 y.o. male seen for follow up of Acute on Chronic Respiratory Failure. Patient remains on the ventilator right now on full support is on pressure control mode on 40% FiO2 good saturations are noted  Medications: Reviewed on Rounds  Physical Exam:  Vitals: Temperature is 96.7 pulse 83 respiratory rate 22 blood pressure is 119/57 saturations 99%  Ventilator Settings on pressure control FiO2 is 40% IP 28 PEEP 5  . General: Comfortable at this time . Eyes: Grossly normal lids, irises & conjunctiva . ENT: grossly tongue is normal . Neck: no obvious mass . Cardiovascular: S1 S2 normal no gallop . Respiratory: No rhonchi coarse breath sounds . Abdomen: soft . Skin: no rash seen on limited exam . Musculoskeletal: not rigid . Psychiatric:unable to assess . Neurologic: no seizure no involuntary movements         Lab Data:   Basic Metabolic Panel: Recent Labs  Lab 10/14/19 0725 10/16/19 0940  NA 145 144  K 4.0 4.0  CL 95* 95*  CO2 40* 41*  GLUCOSE 149* 143*  BUN 21 14  CREATININE 0.44* 0.46*  CALCIUM 9.4 9.7  MG 1.9 2.1  PHOS  --  4.7*    ABG: No results for input(s): PHART, PCO2ART, PO2ART, HCO3, O2SAT in the last 168 hours.  Liver Function Tests: No results for input(s): AST, ALT, ALKPHOS, BILITOT, PROT, ALBUMIN in the last 168 hours. No results for input(s): LIPASE, AMYLASE in the last 168 hours. No results for input(s): AMMONIA in the last 168 hours.  CBC: Recent Labs  Lab 10/14/19 0725  WBC 9.0  HGB 10.3*  HCT 35.1*  MCV 99.2  PLT 192    Cardiac Enzymes: No results for input(s): CKTOTAL, CKMB, CKMBINDEX, TROPONINI in the last 168 hours.  BNP (last 3 results) Recent Labs     07/08/19 1815 07/11/19 0212 07/14/19 0458  BNP 407.1* 224.8* 55.9    ProBNP (last 3 results) No results for input(s): PROBNP in the last 8760 hours.  Radiological Exams: No results found.  Assessment/Plan Active Problems:   Acute on chronic respiratory failure with hypoxia ([name])   Acute respiratory distress syndrome (ARDS) due to COVID-19 virus ([name])   Hodgkin's lymphoma ([name])   Pneumothorax, acute   Healthcare-associated pneumonia   COVID-19 virus infection   1. Acute on chronic respiratory failure hypoxia we will continue with pressure control mode on 40% titrate oxygen as tolerated we will continue pulmonary toilet. 2. ARDS secondary to COVID-19 no change we will continue with supportive care patient has residual pulmonary damage 3. Hodgkin's lymphoma no change we will continue to follow 4. Acute pneumothorax resolved 5. Healthcare associated pneumonia clinically treated 6. COVID-19 virus infection in recovery phase but with significant pulmonary damage   I have personally seen and evaluated the patient, evaluated laboratory and imaging results, formulated the assessment and plan and placed orders. The Patient requires high complexity decision making with multiple systems involvement.  Rounds were done with the Respiratory Therapy Director and Staff therapists and discussed with nursing staff also.  Allyne Gee, MD [facility] Pulmonary Critical Care Medicine Sleep Medicine

## 2019-10-19 LAB — MAGNESIUM: Magnesium: 1.9 mg/dL (ref 1.7–2.4)

## 2019-10-19 LAB — BASIC METABOLIC PANEL
Anion gap: 6 (ref 5–15)
BUN: 34 mg/dL — ABNORMAL HIGH (ref 8–23)
CO2: 40 mmol/L — ABNORMAL HIGH (ref 22–32)
Calcium: 9.7 mg/dL (ref 8.9–10.3)
Chloride: 99 mmol/L (ref 98–111)
Creatinine, Ser: 0.49 mg/dL — ABNORMAL LOW (ref 0.61–1.24)
GFR calc Af Amer: >60 mL/min (ref >60)
GFR calc non Af Amer: >60 mL/min (ref >60)
Glucose, Bld: 177 mg/dL — ABNORMAL HIGH (ref 70–99)
Potassium: 4.4 mmol/L (ref 3.5–5.1)
Sodium: 145 mmol/L (ref 135–145)

## 2019-10-19 LAB — CBC
HCT: 35.6 % — ABNORMAL LOW (ref 39.0–52.0)
Hemoglobin: 10.1 g/dL — ABNORMAL LOW (ref 13.0–17.0)
MCH: 27.6 pg (ref 26.0–34.0)
MCHC: 28.4 g/dL — ABNORMAL LOW (ref 30.0–36.0)
MCV: 97.3 fL (ref 80.0–100.0)
Platelets: 171 10*3/uL (ref 150–400)
RBC: 3.66 MIL/uL — ABNORMAL LOW (ref 4.22–5.81)
RDW: 14.8 % (ref 11.5–15.5)
WBC: 9 10*3/uL (ref 4.0–10.5)
nRBC: 0 % (ref 0.0–0.2)

## 2019-10-19 LAB — PHOSPHORUS: Phosphorus: 3.5 mg/dL (ref 2.5–4.6)

## 2019-10-19 NOTE — Progress Notes (Addendum)
Pulmonary Critical Care Medicine [place]   PULMONARY CRITICAL CARE SERVICE  PROGRESS NOTE  Date of Service: 10/19/2019  Steve [name]  TWS:[id number]  DOB: 1955/11/17   DOA: 08/28/2019  Referring Physician: Merton Border, MD  HPI: Steve [name] is a 64 y.o. male seen for follow up of Acute on Chronic Respiratory Failure.  Patient mains on pressure control at this time rate of 24 with an FiO2 of 40% satting well no fever distress  Medications: Reviewed on Rounds  Physical Exam:  Vitals: Pulse 91 respirations 36 BP 100/52 O2 sat 96% temp 97.5  Ventilator Settings ventilator mode pressure control rate of 24 expiratory pressure 28 PEEP of 5 and FiO2 of 40%  . General: Comfortable at this time . Eyes: Grossly normal lids, irises & conjunctiva . ENT: grossly tongue is normal . Neck: no obvious mass . Cardiovascular: S1 S2 normal no gallop . Respiratory: No rales or rhonchi noted . Abdomen: soft . Skin: no rash seen on limited exam . Musculoskeletal: not rigid . Psychiatric:unable to assess . Neurologic: no seizure no involuntary movements         Lab Data:   Basic Metabolic Panel: Recent Labs  Lab 10/14/19 0725 10/16/19 0940 10/19/19 0537  NA 145 144 145  K 4.0 4.0 4.4  CL 95* 95* 99  CO2 40* 41* 40*  GLUCOSE 149* 143* 177*  BUN 21 14 34*  CREATININE 0.44* 0.46* 0.49*  CALCIUM 9.4 9.7 9.7  MG 1.9 2.1 1.9  PHOS  --  4.7* 3.5    ABG: No results for input(s): PHART, PCO2ART, PO2ART, HCO3, O2SAT in the last 168 hours.  Liver Function Tests: No results for input(s): AST, ALT, ALKPHOS, BILITOT, PROT, ALBUMIN in the last 168 hours. No results for input(s): LIPASE, AMYLASE in the last 168 hours. No results for input(s): AMMONIA in the last 168 hours.  CBC: Recent Labs  Lab 10/14/19 0725 10/19/19 0537  WBC 9.0 9.0  HGB 10.3* 10.1*  HCT 35.1* 35.6*  MCV 99.2 97.3  PLT 192 171    Cardiac Enzymes: No results for input(s): CKTOTAL,  CKMB, CKMBINDEX, TROPONINI in the last 168 hours.  BNP (last 3 results) Recent Labs    07/08/19 1815 07/11/19 0212 07/14/19 0458  BNP 407.1* 224.8* 55.9    ProBNP (last 3 results) No results for input(s): PROBNP in the last 8760 hours.  Radiological Exams: No results found.  Assessment/Plan Active Problems:   Acute on chronic respiratory failure with hypoxia ([name])   Acute respiratory distress syndrome (ARDS) due to COVID-19 virus ([name])   Hodgkin's lymphoma ([name])   Pneumothorax, acute   Healthcare-associated pneumonia   COVID-19 virus infection   1. Acute on chronic respiratory failure hypoxia we will continue with pressure control mode on 40% titrate oxygen as tolerated we will continue pulmonary toilet. 2. ARDS secondary to COVID-19 no change we will continue with supportive care patient has residual pulmonary damage 3. Hodgkin's lymphoma no change we will continue to follow 4. Acute pneumothorax resolved 5. Healthcare associated pneumonia clinically treated 6. COVID-19 virus infection in recovery phase but with significant pulmonary damage   I have personally seen and evaluated the patient, evaluated laboratory and imaging results, formulated the assessment and plan and placed orders. The Patient requires high complexity decision making with multiple systems involvement.  Rounds were done with the Respiratory Therapy Director and Staff therapists and discussed with nursing staff also.  Allyne Gee, MD [facility] Pulmonary Critical Care  Medicine Sleep Medicine

## 2019-10-20 NOTE — Progress Notes (Signed)
Pulmonary Critical Care Medicine [place]   PULMONARY CRITICAL CARE SERVICE  PROGRESS NOTE  Date of Service: 10/20/2019  Steve [name]  FOY:[id number]  DOB: 02-May-1955   DOA: 08/28/2019  Referring Physician: Merton Border, MD  HPI: Steve [name] is a 64 y.o. male seen for follow up of Acute on Chronic Respiratory Failure.  Patient currently is on pressure control of been on 40% FiO2 with an IP of 28  Medications: Reviewed on Rounds  Physical Exam:  Vitals: Temperature is 96.1 pulse 78 respiratory 30 blood pressure is 120/74 saturations 100%  Ventilator Settings on pressure control FiO2 40% IP 28 PEEP 5 tidal volume 400   General: Comfortable at this time  Eyes: Grossly normal lids, irises & conjunctiva  ENT: grossly tongue is normal  Neck: no obvious mass  Cardiovascular: S1 S2 normal no gallop  Respiratory: No rhonchi no rales are noted at this time  Abdomen: soft  Skin: no rash seen on limited exam  Musculoskeletal: not rigid  Psychiatric:unable to assess  Neurologic: no seizure no involuntary movements         Lab Data:   Basic Metabolic Panel: Recent Labs  Lab 10/14/19 0725 10/16/19 0940 10/19/19 0537  NA 145 144 145  K 4.0 4.0 4.4  CL 95* 95* 99  CO2 40* 41* 40*  GLUCOSE 149* 143* 177*  BUN 21 14 34*  CREATININE 0.44* 0.46* 0.49*  CALCIUM 9.4 9.7 9.7  MG 1.9 2.1 1.9  PHOS  --  4.7* 3.5    ABG: No results for input(s): PHART, PCO2ART, PO2ART, HCO3, O2SAT in the last 168 hours.  Liver Function Tests: No results for input(s): AST, ALT, ALKPHOS, BILITOT, PROT, ALBUMIN in the last 168 hours. No results for input(s): LIPASE, AMYLASE in the last 168 hours. No results for input(s): AMMONIA in the last 168 hours.  CBC: Recent Labs  Lab 10/14/19 0725 10/19/19 0537  WBC 9.0 9.0  HGB 10.3* 10.1*  HCT 35.1* 35.6*  MCV 99.2 97.3  PLT 192 171    Cardiac Enzymes: No results for input(s): CKTOTAL, CKMB, CKMBINDEX,  TROPONINI in the last 168 hours.  BNP (last 3 results) Recent Labs    07/08/19 1815 07/11/19 0212 07/14/19 0458  BNP 407.1* 224.8* 55.9    ProBNP (last 3 results) No results for input(s): PROBNP in the last 8760 hours.  Radiological Exams: No results found.  Assessment/Plan Active Problems:   Acute on chronic respiratory failure with hypoxia ([name])   Acute respiratory distress syndrome (ARDS) due to COVID-19 virus ([name])   Hodgkin's lymphoma ([name])   Pneumothorax, acute   Healthcare-associated pneumonia   COVID-19 virus infection   1. Acute on chronic respiratory failure hypoxia we will continue with pressure control titrate oxygen as tolerated 2. ARDS supportive care oxygen requirements have slowly improved over time 3. Hodgkin's lymphoma no change 4. Acute pneumothorax status post chest tube 5. Healthcare associated pneumonia treated clinically is improving 6. COVID-19 virus infection in resolution   I have personally seen and evaluated the patient, evaluated laboratory and imaging results, formulated the assessment and plan and placed orders. The Patient requires high complexity decision making with multiple systems involvement.  Rounds were done with the Respiratory Therapy Director and Staff therapists and discussed with nursing staff also.  Allyne Gee, MD [facility] Pulmonary Critical Care Medicine Sleep Medicine

## 2019-10-21 NOTE — Progress Notes (Signed)
Pulmonary Critical Care Medicine [place]   PULMONARY CRITICAL CARE SERVICE  PROGRESS NOTE  Date of Service: 10/21/2019  Steve [name]  SAY:[id number]  DOB: 1955-11-08   DOA: 08/28/2019  Referring Physician: Merton Border, MD  HPI: Steve [name] is a 64 y.o. male seen for follow up of Acute on Chronic Respiratory Failure.  Patient is on pressure control mode full support right now requiring 40% FiO2  Medications: Reviewed on Rounds  Physical Exam:  Vitals: Temperature is 96.9 pulse 78 respiratory 27 blood pressure is 120/76 saturations 100%  Ventilator Settings on pressure assist control FiO2 40% tidal volume 488 respiratory rate 24 IP 28   General: Comfortable at this time  Eyes: Grossly normal lids, irises & conjunctiva  ENT: grossly tongue is normal  Neck: no obvious mass  Cardiovascular: S1 S2 normal no gallop  Respiratory: No rhonchi coarse breath sounds  Abdomen: soft  Skin: no rash seen on limited exam  Musculoskeletal: not rigid  Psychiatric:unable to assess  Neurologic: no seizure no involuntary movements         Lab Data:   Basic Metabolic Panel: Recent Labs  Lab 10/16/19 0940 10/19/19 0537  NA 144 145  K 4.0 4.4  CL 95* 99  CO2 41* 40*  GLUCOSE 143* 177*  BUN 14 34*  CREATININE 0.46* 0.49*  CALCIUM 9.7 9.7  MG 2.1 1.9  PHOS 4.7* 3.5    ABG: No results for input(s): PHART, PCO2ART, PO2ART, HCO3, O2SAT in the last 168 hours.  Liver Function Tests: No results for input(s): AST, ALT, ALKPHOS, BILITOT, PROT, ALBUMIN in the last 168 hours. No results for input(s): LIPASE, AMYLASE in the last 168 hours. No results for input(s): AMMONIA in the last 168 hours.  CBC: Recent Labs  Lab 10/19/19 0537  WBC 9.0  HGB 10.1*  HCT 35.6*  MCV 97.3  PLT 171    Cardiac Enzymes: No results for input(s): CKTOTAL, CKMB, CKMBINDEX, TROPONINI in the last 168 hours.  BNP (last 3 results) Recent Labs    07/08/19 1815  07/11/19 0212 07/14/19 0458  BNP 407.1* 224.8* 55.9    ProBNP (last 3 results) No results for input(s): PROBNP in the last 8760 hours.  Radiological Exams: No results found.  Assessment/Plan Active Problems:   Acute on chronic respiratory failure with hypoxia ([name])   Acute respiratory distress syndrome (ARDS) due to COVID-19 virus ([name])   Hodgkin's lymphoma ([name])   Pneumothorax, acute   Healthcare-associated pneumonia   COVID-19 virus infection   1. Acute on chronic respiratory failure with hypoxia plan is to continue with full support patient is not a candidate for weaning. 2. ARDS supportive care 3. Hodgkin's lymphoma in recovery 4. Pneumothorax resolved 5. Healthcare associated pneumonia treated clinically improving 6. COVID-19 virus infection resolved   I have personally seen and evaluated the patient, evaluated laboratory and imaging results, formulated the assessment and plan and placed orders. The Patient requires high complexity decision making with multiple systems involvement.  Rounds were done with the Respiratory Therapy Director and Staff therapists and discussed with nursing staff also.  Allyne Gee, MD [facility] Pulmonary Critical Care Medicine Sleep Medicine

## 2019-10-22 NOTE — Progress Notes (Signed)
Pulmonary Critical Care Medicine [place]   PULMONARY CRITICAL CARE SERVICE  PROGRESS NOTE  Date of Service: 10/22/2019  Steve [name]  DPO:[id number]  DOB: 1955/05/25   DOA: 08/28/2019  Referring Physician: Merton Border, MD  HPI: Steve [name] is a 64 y.o. male seen for follow up of Acute on Chronic Respiratory Failure.  Patient currently is on the ventilator awaiting discharge home with vent.  Patient is on pressure assist control mode  Medications: Reviewed on Rounds  Physical Exam:  Vitals: Temperature is 97.0 pulse 95 respiratory 25 blood pressure is 141/74 saturations 100%  Ventilator Settings on pressure assist control FiO2 35% IP 28 PEEP 5 respiratory rate set at 24  . General: Comfortable at this time . Eyes: Grossly normal lids, irises & conjunctiva . ENT: grossly tongue is normal . Neck: no obvious mass . Cardiovascular: S1 S2 normal no gallop . Respiratory: Scattered rhonchi expansion is equal . Abdomen: soft . Skin: no rash seen on limited exam . Musculoskeletal: not rigid . Psychiatric:unable to assess . Neurologic: no seizure no involuntary movements         Lab Data:   Basic Metabolic Panel: Recent Labs  Lab 10/16/19 0940 10/19/19 0537  NA 144 145  K 4.0 4.4  CL 95* 99  CO2 41* 40*  GLUCOSE 143* 177*  BUN 14 34*  CREATININE 0.46* 0.49*  CALCIUM 9.7 9.7  MG 2.1 1.9  PHOS 4.7* 3.5    ABG: No results for input(s): PHART, PCO2ART, PO2ART, HCO3, O2SAT in the last 168 hours.  Liver Function Tests: No results for input(s): AST, ALT, ALKPHOS, BILITOT, PROT, ALBUMIN in the last 168 hours. No results for input(s): LIPASE, AMYLASE in the last 168 hours. No results for input(s): AMMONIA in the last 168 hours.  CBC: Recent Labs  Lab 10/19/19 0537  WBC 9.0  HGB 10.1*  HCT 35.6*  MCV 97.3  PLT 171    Cardiac Enzymes: No results for input(s): CKTOTAL, CKMB, CKMBINDEX, TROPONINI in the last 168 hours.  BNP (last 3  results) Recent Labs    07/08/19 1815 07/11/19 0212 07/14/19 0458  BNP 407.1* 224.8* 55.9    ProBNP (last 3 results) No results for input(s): PROBNP in the last 8760 hours.  Radiological Exams: No results found.  Assessment/Plan Active Problems:   Acute on chronic respiratory failure with hypoxia ([name])   Acute respiratory distress syndrome (ARDS) due to COVID-19 virus ([name])   Hodgkin's lymphoma ([name])   Pneumothorax, acute   Healthcare-associated pneumonia   COVID-19 virus infection   1. Acute on chronic respiratory failure hypoxia plan is to continue with full support on the ventilator awaiting final discharge home with home ventilator. 2. ARDS patient is at baseline now 3. Hodgkin's lymphoma supportive care 4. Pneumothorax resolved 5. Healthcare associated pneumonia treated improved 6. COVID-19 virus infection in resolution   I have personally seen and evaluated the patient, evaluated laboratory and imaging results, formulated the assessment and plan and placed orders. The Patient requires high complexity decision making with multiple systems involvement.  Rounds were done with the Respiratory Therapy Director and Staff therapists and discussed with nursing staff also.  Allyne Gee, MD [facility] Pulmonary Critical Care Medicine Sleep Medicine

## 2019-10-23 LAB — BASIC METABOLIC PANEL
Anion gap: 10 (ref 5–15)
BUN: 15 mg/dL (ref 8–23)
CO2: 37 mmol/L — ABNORMAL HIGH (ref 22–32)
Calcium: 9.7 mg/dL (ref 8.9–10.3)
Chloride: 95 mmol/L — ABNORMAL LOW (ref 98–111)
Creatinine, Ser: 0.51 mg/dL — ABNORMAL LOW (ref 0.61–1.24)
GFR calc Af Amer: >60 mL/min (ref >60)
GFR calc non Af Amer: >60 mL/min (ref >60)
Glucose, Bld: 133 mg/dL — ABNORMAL HIGH (ref 70–99)
Potassium: 4.4 mmol/L (ref 3.5–5.1)
Sodium: 142 mmol/L (ref 135–145)

## 2019-10-23 LAB — CBC
HCT: 35.2 % — ABNORMAL LOW (ref 39.0–52.0)
Hemoglobin: 10.4 g/dL — ABNORMAL LOW (ref 13.0–17.0)
MCH: 28.2 pg (ref 26.0–34.0)
MCHC: 29.5 g/dL — ABNORMAL LOW (ref 30.0–36.0)
MCV: 95.4 fL (ref 80.0–100.0)
Platelets: 180 10*3/uL (ref 150–400)
RBC: 3.69 MIL/uL — ABNORMAL LOW (ref 4.22–5.81)
RDW: 14.9 % (ref 11.5–15.5)
WBC: 9 10*3/uL (ref 4.0–10.5)
nRBC: 0 % (ref 0.0–0.2)

## 2019-10-23 LAB — MAGNESIUM: Magnesium: 1.9 mg/dL (ref 1.7–2.4)

## 2019-10-23 NOTE — Progress Notes (Addendum)
Pulmonary Critical Care Medicine [place]   PULMONARY CRITICAL CARE SERVICE  PROGRESS NOTE  Date of Service: 10/23/2019  Steve [name]  VFI:[id number]  DOB: 1956-03-03   DOA: 08/28/2019  Referring Physician: Merton Border, MD  HPI: Steve [name] is a 64 y.o. male seen for follow up of Acute on Chronic Respiratory Failure.  Patient continues on full support ventilator at this time pressure control mode rate of 24 and FiO2 35% plan is for him to go home on the vent.  Family training is occurring at this time.  Medications: Reviewed on Rounds  Physical Exam:  Vitals: Pulse 90 respirations 29 BP 110/67 O2 sat 95% temp 97.1  Ventilator Settings AC PC rate of 24 expiratory pressure of 28 PEEP of 5 and FiO2 35%   General: Comfortable at this time  Eyes: Grossly normal lids, irises & conjunctiva  ENT: grossly tongue is normal  Neck: no obvious mass  Cardiovascular: S1 S2 normal no gallop  Respiratory: No rales or rhonchi noted  Abdomen: soft  Skin: no rash seen on limited exam  Musculoskeletal: not rigid  Psychiatric:unable to assess  Neurologic: no seizure no involuntary movements         Lab Data:   Basic Metabolic Panel: Recent Labs  Lab 10/19/19 0537 10/23/19 0636  NA 145 142  K 4.4 4.4  CL 99 95*  CO2 40* 37*  GLUCOSE 177* 133*  BUN 34* 15  CREATININE 0.49* 0.51*  CALCIUM 9.7 9.7  MG 1.9 1.9  PHOS 3.5  --     ABG: No results for input(s): PHART, PCO2ART, PO2ART, HCO3, O2SAT in the last 168 hours.  Liver Function Tests: No results for input(s): AST, ALT, ALKPHOS, BILITOT, PROT, ALBUMIN in the last 168 hours. No results for input(s): LIPASE, AMYLASE in the last 168 hours. No results for input(s): AMMONIA in the last 168 hours.  CBC: Recent Labs  Lab 10/19/19 0537 10/23/19 0636  WBC 9.0 9.0  HGB 10.1* 10.4*  HCT 35.6* 35.2*  MCV 97.3 95.4  PLT 171 180    Cardiac Enzymes: No results for input(s): CKTOTAL, CKMB,  CKMBINDEX, TROPONINI in the last 168 hours.  BNP (last 3 results) Recent Labs    07/08/19 1815 07/11/19 0212 07/14/19 0458  BNP 407.1* 224.8* 55.9    ProBNP (last 3 results) No results for input(s): PROBNP in the last 8760 hours.  Radiological Exams: No results found.  Assessment/Plan Active Problems:   Acute on chronic respiratory failure with hypoxia ([name])   Acute respiratory distress syndrome (ARDS) due to COVID-19 virus ([name])   Hodgkin's lymphoma ([name])   Pneumothorax, acute   Healthcare-associated pneumonia   COVID-19 virus infection   1. Acute on chronic respiratory failure hypoxia plan is to continue with full support on the ventilator awaiting final discharge home with home ventilator. 2. ARDS patient is at baseline now 3. Hodgkin's lymphoma supportive care 4. Pneumothorax resolved 5. Healthcare associated pneumonia treated improved 6. COVID-19 virus infection in resolution   I have personally seen and evaluated the patient, evaluated laboratory and imaging results, formulated the assessment and plan and placed orders. The Patient requires high complexity decision making with multiple systems involvement.  Rounds were done with the Respiratory Therapy Director and Staff therapists and discussed with nursing staff also.  Allyne Gee, MD [facility] Pulmonary Critical Care Medicine Sleep Medicine

## 2019-10-24 NOTE — Progress Notes (Signed)
Pulmonary Critical Care Medicine [place]   PULMONARY CRITICAL CARE SERVICE  PROGRESS NOTE  Date of Service: 10/24/2019  Steve [name]  VQM:[id number]  DOB: 05-12-1955   DOA: 08/28/2019  Referring Physician: Merton Border, MD  HPI: Steve [name] is a 64 y.o. male seen for follow up of Acute on Chronic Respiratory Failure.  Patient currently is on pressure control mode has been on 40% FiO2 with good saturations.  Has been requiring PEEP of about 5  Medications: Reviewed on Rounds  Physical Exam:  Vitals: Temperature 97.1 pulse 79 respiratory rate 30 blood pressure is 112/66 saturations 99  Ventilator Settings on pressure assist control FiO2 40% IP 28 PEEP 5  . General: Comfortable at this time . Eyes: Grossly normal lids, irises & conjunctiva . ENT: grossly tongue is normal . Neck: no obvious mass . Cardiovascular: S1 S2 normal no gallop . Respiratory: No rhonchi coarse breath sounds . Abdomen: soft . Skin: no rash seen on limited exam . Musculoskeletal: not rigid . Psychiatric:unable to assess . Neurologic: no seizure no involuntary movements         Lab Data:   Basic Metabolic Panel: Recent Labs  Lab 10/19/19 0537 10/23/19 0636  NA 145 142  K 4.4 4.4  CL 99 95*  CO2 40* 37*  GLUCOSE 177* 133*  BUN 34* 15  CREATININE 0.49* 0.51*  CALCIUM 9.7 9.7  MG 1.9 1.9  PHOS 3.5  --     ABG: No results for input(s): PHART, PCO2ART, PO2ART, HCO3, O2SAT in the last 168 hours.  Liver Function Tests: No results for input(s): AST, ALT, ALKPHOS, BILITOT, PROT, ALBUMIN in the last 168 hours. No results for input(s): LIPASE, AMYLASE in the last 168 hours. No results for input(s): AMMONIA in the last 168 hours.  CBC: Recent Labs  Lab 10/19/19 0537 10/23/19 0636  WBC 9.0 9.0  HGB 10.1* 10.4*  HCT 35.6* 35.2*  MCV 97.3 95.4  PLT 171 180    Cardiac Enzymes: No results for input(s): CKTOTAL, CKMB, CKMBINDEX, TROPONINI in the last 168  hours.  BNP (last 3 results) Recent Labs    07/08/19 1815 07/11/19 0212 07/14/19 0458  BNP 407.1* 224.8* 55.9    ProBNP (last 3 results) No results for input(s): PROBNP in the last 8760 hours.  Radiological Exams: No results found.  Assessment/Plan Active Problems:   Acute on chronic respiratory failure with hypoxia ([name])   Acute respiratory distress syndrome (ARDS) due to COVID-19 virus ([name])   Hodgkin's lymphoma ([name])   Pneumothorax, acute   Healthcare-associated pneumonia   COVID-19 virus infection   1. Acute on chronic respiratory failure with hypoxia plan is to continue with full support on the ventilator patient is at his baseline. 2. ARDS treated clinically is improving we will continue with supportive care. 3. Hodgkin's lymphoma no change we will continue to follow along 4. Acute pneumothorax resolved 5. Healthcare associated pneumonia continue with supportive care 6. COVID-19 virus infection in resolution phase   I have personally seen and evaluated the patient, evaluated laboratory and imaging results, formulated the assessment and plan and placed orders. The Patient requires high complexity decision making with multiple systems involvement.  Rounds were done with the Respiratory Therapy Director and Staff therapists and discussed with nursing staff also.  Allyne Gee, MD [facility] Pulmonary Critical Care Medicine Sleep Medicine

## 2019-10-25 NOTE — Progress Notes (Addendum)
Pulmonary Critical Care Medicine [place]   PULMONARY CRITICAL CARE SERVICE  PROGRESS NOTE  Date of Service: 10/25/2019  CORRADO HYMON  QIH:[id number]  DOB: 12/19/1955   DOA: 08/28/2019  Referring Physician: Merton Border, MD  HPI: OTHELLO DICKENSON is a 64 y.o. male seen for follow up of Acute on Chronic Respiratory Failure.  Patient continues on full support ventilator pressure control mode rate of 24 with an FiO2 of 40% currently satting well no distress  Medications: Reviewed on Rounds  Physical Exam:  Vitals: Pulse 76 respirations 34 BP 103/76 O2 sat 99% temp 95.3  Ventilator Settings ventilator mode AC PC rate of 24 expiratory pressure of 28 PEEP of 5 and FiO2 40%  . General: Comfortable at this time . Eyes: Grossly normal lids, irises & conjunctiva . ENT: grossly tongue is normal . Neck: no obvious mass . Cardiovascular: S1 S2 normal no gallop . Respiratory: No rales or rhonchi noted . Abdomen: soft . Skin: no rash seen on limited exam . Musculoskeletal: not rigid . Psychiatric:unable to assess . Neurologic: no seizure no involuntary movements         Lab Data:   Basic Metabolic Panel: Recent Labs  Lab 10/19/19 0537 10/23/19 0636  NA 145 142  K 4.4 4.4  CL 99 95*  CO2 40* 37*  GLUCOSE 177* 133*  BUN 34* 15  CREATININE 0.49* 0.51*  CALCIUM 9.7 9.7  MG 1.9 1.9  PHOS 3.5  --     ABG: No results for input(s): PHART, PCO2ART, PO2ART, HCO3, O2SAT in the last 168 hours.  Liver Function Tests: No results for input(s): AST, ALT, ALKPHOS, BILITOT, PROT, ALBUMIN in the last 168 hours. No results for input(s): LIPASE, AMYLASE in the last 168 hours. No results for input(s): AMMONIA in the last 168 hours.  CBC: Recent Labs  Lab 10/19/19 0537 10/23/19 0636  WBC 9.0 9.0  HGB 10.1* 10.4*  HCT 35.6* 35.2*  MCV 97.3 95.4  PLT 171 180    Cardiac Enzymes: No results for input(s): CKTOTAL, CKMB, CKMBINDEX, TROPONINI in the last 168  hours.  BNP (last 3 results) Recent Labs    07/08/19 1815 07/11/19 0212 07/14/19 0458  BNP 407.1* 224.8* 55.9    ProBNP (last 3 results) No results for input(s): PROBNP in the last 8760 hours.  Radiological Exams: No results found.  Assessment/Plan Active Problems:   Acute on chronic respiratory failure with hypoxia ([name])   Acute respiratory distress syndrome (ARDS) due to COVID-19 virus ([name])   Hodgkin's lymphoma ([name])   Pneumothorax, acute   Healthcare-associated pneumonia   COVID-19 virus infection   1. Acute on chronic respiratory failure with hypoxia plan is to continue with full support on the ventilator patient is at his baseline. 2. ARDS treated clinically is improving we will continue with supportive care. 3. Hodgkin's lymphoma no change we will continue to follow along 4. Acute pneumothorax resolved 5. Healthcare associated pneumonia continue with supportive care 6. COVID-19 virus infection in resolution phase   I have personally seen and evaluated the patient, evaluated laboratory and imaging results, formulated the assessment and plan and placed orders. The Patient requires high complexity decision making with multiple systems involvement.  Rounds were done with the Respiratory Therapy Director and Staff therapists and discussed with nursing staff also.  Allyne Gee, MD [facility] Pulmonary Critical Care Medicine Sleep Medicine

## 2019-10-26 NOTE — Progress Notes (Signed)
Pulmonary Critical Care Medicine [place]   PULMONARY CRITICAL CARE SERVICE  PROGRESS NOTE  Date of Service: 10/26/2019  Steve [name]  IRW:[id number]  DOB: 05/06/1955   DOA: 08/28/2019  Referring Physician: Merton Border, MD  HPI: Steve [name] is a 64 y.o. male seen for follow up of Acute on Chronic Respiratory Failure.  Patient remains on pressure control mode currently on 40% FiO2 still waiting on home health to be arranged for discharge at home.  Family has apparently been trained satisfactorily  Medications: Reviewed on Rounds  Physical Exam:  Vitals: Temperature is 96.3 pulse 84 respiratory rate 37 blood pressure is 150/90 saturations 93%  Ventilator Settings on pressure control FiO2 40% IP 28 PEEP 5  . General: Comfortable at this time . Eyes: Grossly normal lids, irises & conjunctiva . ENT: grossly tongue is normal . Neck: no obvious mass . Cardiovascular: S1 S2 normal no gallop . Respiratory: No rhonchi coarse breath sounds are noted . Abdomen: soft . Skin: no rash seen on limited exam . Musculoskeletal: not rigid . Psychiatric:unable to assess . Neurologic: no seizure no involuntary movements         Lab Data:   Basic Metabolic Panel: Recent Labs  Lab 10/23/19 0636  NA 142  K 4.4  CL 95*  CO2 37*  GLUCOSE 133*  BUN 15  CREATININE 0.51*  CALCIUM 9.7  MG 1.9    ABG: No results for input(s): PHART, PCO2ART, PO2ART, HCO3, O2SAT in the last 168 hours.  Liver Function Tests: No results for input(s): AST, ALT, ALKPHOS, BILITOT, PROT, ALBUMIN in the last 168 hours. No results for input(s): LIPASE, AMYLASE in the last 168 hours. No results for input(s): AMMONIA in the last 168 hours.  CBC: Recent Labs  Lab 10/23/19 0636  WBC 9.0  HGB 10.4*  HCT 35.2*  MCV 95.4  PLT 180    Cardiac Enzymes: No results for input(s): CKTOTAL, CKMB, CKMBINDEX, TROPONINI in the last 168 hours.  BNP (last 3 results) Recent Labs     07/08/19 1815 07/11/19 0212 07/14/19 0458  BNP 407.1* 224.8* 55.9    ProBNP (last 3 results) No results for input(s): PROBNP in the last 8760 hours.  Radiological Exams: No results found.  Assessment/Plan Active Problems:   Acute on chronic respiratory failure with hypoxia ([name])   Acute respiratory distress syndrome (ARDS) due to COVID-19 virus ([name])   Hodgkin's lymphoma ([name])   Pneumothorax, acute   Healthcare-associated pneumonia   COVID-19 virus infection   1. Acute on chronic respiratory failure with hypoxia we will continue with pressure control mode currently on 40% FiO2 with an IP of 28 and a PEEP of 5. 2. ARDS treated clinically appears to be improving 3. Hodgkin's lymphoma no change continue with supportive care 4. Acute pneumothorax at baseline resolved 5. Healthcare associated pneumonia treated improving 6. COVID-19 virus infection in resolution with severe residual pulmonary damage   I have personally seen and evaluated the patient, evaluated laboratory and imaging results, formulated the assessment and plan and placed orders. The Patient requires high complexity decision making with multiple systems involvement.  Rounds were done with the Respiratory Therapy Director and Staff therapists and discussed with nursing staff also.  Allyne Gee, MD [facility] Pulmonary Critical Care Medicine Sleep Medicine

## 2019-10-27 LAB — BASIC METABOLIC PANEL
Anion gap: 9 (ref 5–15)
BUN: 20 mg/dL (ref 8–23)
CO2: 35 mmol/L — ABNORMAL HIGH (ref 22–32)
Calcium: 9.6 mg/dL (ref 8.9–10.3)
Chloride: 94 mmol/L — ABNORMAL LOW (ref 98–111)
Creatinine, Ser: 0.44 mg/dL — ABNORMAL LOW (ref 0.61–1.24)
GFR calc Af Amer: >60 mL/min (ref >60)
GFR calc non Af Amer: >60 mL/min (ref >60)
Glucose, Bld: 156 mg/dL — ABNORMAL HIGH (ref 70–99)
Potassium: 4.3 mmol/L (ref 3.5–5.1)
Sodium: 138 mmol/L (ref 135–145)

## 2019-10-27 LAB — CBC
HCT: 35.8 % — ABNORMAL LOW (ref 39.0–52.0)
Hemoglobin: 10.5 g/dL — ABNORMAL LOW (ref 13.0–17.0)
MCH: 27.5 pg (ref 26.0–34.0)
MCHC: 29.3 g/dL — ABNORMAL LOW (ref 30.0–36.0)
MCV: 93.7 fL (ref 80.0–100.0)
Platelets: 185 10*3/uL (ref 150–400)
RBC: 3.82 MIL/uL — ABNORMAL LOW (ref 4.22–5.81)
RDW: 14.9 % (ref 11.5–15.5)
WBC: 7.7 10*3/uL (ref 4.0–10.5)
nRBC: 0 % (ref 0.0–0.2)

## 2019-10-27 LAB — MAGNESIUM: Magnesium: 1.8 mg/dL (ref 1.7–2.4)

## 2019-10-27 NOTE — Progress Notes (Addendum)
Pulmonary Critical Care Medicine [place]   PULMONARY CRITICAL CARE SERVICE  PROGRESS NOTE  Date of Service: 10/27/2019  Steve [name]  [id number]:[id number]  DOB: Oct 01, 1955   DOA: 08/28/2019  Referring Physician: Merton Border, MD  HPI: Steve [name] is a 64 y.o. male seen for follow up of Acute on Chronic Respiratory Failure.  Patient is unable to wean remains on full support at this time pressure control mode rate 24 with an FiO2 of 40% satting well no fever or distress.  Medications: Reviewed on Rounds  Physical Exam:  Vitals: Pulse 83 respirations 36 BP 112/74 O2 sat 98% temp 97.4  Ventilator Settings ventilator mode AC PC rate of 24 expiratory pressure of 28 PEEP of 5 and FiO2 40%  . General: Comfortable at this time . Eyes: Grossly normal lids, irises & conjunctiva . ENT: grossly tongue is normal . Neck: no obvious mass . Cardiovascular: S1 S2 normal no gallop . Respiratory: No rales or rhonchi noted . Abdomen: soft . Skin: no rash seen on limited exam . Musculoskeletal: not rigid . Psychiatric:unable to assess . Neurologic: no seizure no involuntary movements         Lab Data:   Basic Metabolic Panel: Recent Labs  Lab 10/23/19 0636 10/27/19 0606  NA 142 138  K 4.4 4.3  CL 95* 94*  CO2 37* 35*  GLUCOSE 133* 156*  BUN 15 20  CREATININE 0.51* 0.44*  CALCIUM 9.7 9.6  MG 1.9 1.8    ABG: No results for input(s): PHART, PCO2ART, PO2ART, HCO3, O2SAT in the last 168 hours.  Liver Function Tests: No results for input(s): AST, ALT, ALKPHOS, BILITOT, PROT, ALBUMIN in the last 168 hours. No results for input(s): LIPASE, AMYLASE in the last 168 hours. No results for input(s): AMMONIA in the last 168 hours.  CBC: Recent Labs  Lab 10/23/19 0636 10/27/19 0606  WBC 9.0 7.7  HGB 10.4* 10.5*  HCT 35.2* 35.8*  MCV 95.4 93.7  PLT 180 185    Cardiac Enzymes: No results for input(s): CKTOTAL, CKMB, CKMBINDEX, TROPONINI in the last 168  hours.  BNP (last 3 results) Recent Labs    07/08/19 1815 07/11/19 0212 07/14/19 0458  BNP 407.1* 224.8* 55.9    ProBNP (last 3 results) No results for input(s): PROBNP in the last 8760 hours.  Radiological Exams: No results found.  Assessment/Plan Active Problems:   Acute on chronic respiratory failure with hypoxia ([name])   Acute respiratory distress syndrome (ARDS) due to COVID-19 virus ([name])   Hodgkin's lymphoma ([name])   Pneumothorax, acute   Healthcare-associated pneumonia   COVID-19 virus infection   1. Acute on chronic respiratory failure hypoxia plan is to continue with full support ventilator at this time there is discussion of training family and patient going home on ventilator.  Continue port measures. 2. ARDS treated clinically improved 3. Hodgkin's lymphoma no change we will continue to follow 4. Acute pneumothorax resolved 5. Healthcare associated pneumonia treated. 6. COVID-19 virus infection in resolution phase   I have personally seen and evaluated the patient, evaluated laboratory and imaging results, formulated the assessment and plan and placed orders. The Patient requires high complexity decision making with multiple systems involvement.  Rounds were done with the Respiratory Therapy Director and Staff therapists and discussed with nursing staff also.  Allyne Gee, MD [facility] Pulmonary Critical Care Medicine Sleep Medicine

## 2019-10-28 NOTE — Progress Notes (Signed)
Pulmonary Critical Care Medicine [place]   PULMONARY CRITICAL CARE SERVICE  PROGRESS NOTE  Date of Service: 10/28/2019  Steve [name]  [id number]:[id number]  DOB: Jul 21, 1955   DOA: 08/28/2019  Referring Physician: Merton Border, MD  HPI: Steve [name] is a 64 y.o. male seen for follow up of Acute on Chronic Respiratory Failure.  Patient right now is on the ventilator and full support remains on pressure control mode still awaiting discharge home on the ventilator  Medications: Reviewed on Rounds  Physical Exam:  Vitals: Temperature 96.8 pulse 78 respiratory rate 30 blood pressure is 120/72 saturations 99%  Ventilator Settings on pressure assist control FiO2 40% IP 28 PEEP 5 tidal volume is about 640   General: Comfortable at this time  Eyes: Grossly normal lids, irises & conjunctiva  ENT: grossly tongue is normal  Neck: no obvious mass  Cardiovascular: S1 S2 normal no gallop  Respiratory: Scattered rhonchi coarse breath sounds  Abdomen: soft  Skin: no rash seen on limited exam  Musculoskeletal: not rigid  Psychiatric:unable to assess  Neurologic: no seizure no involuntary movements         Lab Data:   Basic Metabolic Panel: Recent Labs  Lab 10/23/19 0636 10/27/19 0606  NA 142 138  K 4.4 4.3  CL 95* 94*  CO2 37* 35*  GLUCOSE 133* 156*  BUN 15 20  CREATININE 0.51* 0.44*  CALCIUM 9.7 9.6  MG 1.9 1.8    ABG: No results for input(s): PHART, PCO2ART, PO2ART, HCO3, O2SAT in the last 168 hours.  Liver Function Tests: No results for input(s): AST, ALT, ALKPHOS, BILITOT, PROT, ALBUMIN in the last 168 hours. No results for input(s): LIPASE, AMYLASE in the last 168 hours. No results for input(s): AMMONIA in the last 168 hours.  CBC: Recent Labs  Lab 10/23/19 0636 10/27/19 0606  WBC 9.0 7.7  HGB 10.4* 10.5*  HCT 35.2* 35.8*  MCV 95.4 93.7  PLT 180 185    Cardiac Enzymes: No results for input(s): CKTOTAL, CKMB, CKMBINDEX,  TROPONINI in the last 168 hours.  BNP (last 3 results) Recent Labs    07/08/19 1815 07/11/19 0212 07/14/19 0458  BNP 407.1* 224.8* 55.9    ProBNP (last 3 results) No results for input(s): PROBNP in the last 8760 hours.  Radiological Exams: No results found.  Assessment/Plan Active Problems:   Acute on chronic respiratory failure with hypoxia ([name])   Acute respiratory distress syndrome (ARDS) due to COVID-19 virus ([name])   Hodgkin's lymphoma ([name])   Pneumothorax, acute   Healthcare-associated pneumonia   COVID-19 virus infection   1. Acute on chronic respiratory failure hypoxia plan is to continue with the ventilator and full support awaiting discharge planning for set up at home 2. ARDS treated clinically improved 3. Hodgkin's lymphoma no change we will continue to follow 4. Acute pneumothorax resolved 5. Healthcare associated pneumonia treated. 6. COVID-19 virus infection in resolution phase   I have personally seen and evaluated the patient, evaluated laboratory and imaging results, formulated the assessment and plan and placed orders. The Patient requires high complexity decision making with multiple systems involvement.  Rounds were done with the Respiratory Therapy Director and Staff therapists and discussed with nursing staff also.  Allyne Gee, MD [facility] Pulmonary Critical Care Medicine Sleep Medicine

## 2019-10-29 NOTE — Progress Notes (Signed)
Pulmonary Critical Care Medicine [place]   PULMONARY CRITICAL CARE SERVICE  PROGRESS NOTE  Date of Service: 10/29/2019  Steve [name]  [id number]:[id number]  DOB: 07-Feb-1956   DOA: 08/28/2019  Referring Physician: Merton Border, MD  HPI: Steve [name] is a 64 y.o. male seen for follow up of Acute on Chronic Respiratory Failure.  Remains on the ventilator and full support currently on pressure control mode.  Still awaiting discharge with home health  Medications: Reviewed on Rounds  Physical Exam:  Vitals: Temperature 96.2 pulse 84 respiratory rate 29 blood pressure is 110/77 saturations 98%  Ventilator Settings on pressure assist control FiO2 40% tidal volume 400 PEEP 5   General: Comfortable at this time  Eyes: Grossly normal lids, irises & conjunctiva  ENT: grossly tongue is normal  Neck: no obvious mass  Cardiovascular: S1 S2 normal no gallop  Respiratory: No rhonchi coarse breath sounds  Abdomen: soft  Skin: no rash seen on limited exam  Musculoskeletal: not rigid  Psychiatric:unable to assess  Neurologic: no seizure no involuntary movements         Lab Data:   Basic Metabolic Panel: Recent Labs  Lab 10/23/19 0636 10/27/19 0606  NA 142 138  K 4.4 4.3  CL 95* 94*  CO2 37* 35*  GLUCOSE 133* 156*  BUN 15 20  CREATININE 0.51* 0.44*  CALCIUM 9.7 9.6  MG 1.9 1.8    ABG: No results for input(s): PHART, PCO2ART, PO2ART, HCO3, O2SAT in the last 168 hours.  Liver Function Tests: No results for input(s): AST, ALT, ALKPHOS, BILITOT, PROT, ALBUMIN in the last 168 hours. No results for input(s): LIPASE, AMYLASE in the last 168 hours. No results for input(s): AMMONIA in the last 168 hours.  CBC: Recent Labs  Lab 10/23/19 0636 10/27/19 0606  WBC 9.0 7.7  HGB 10.4* 10.5*  HCT 35.2* 35.8*  MCV 95.4 93.7  PLT 180 185    Cardiac Enzymes: No results for input(s): CKTOTAL, CKMB, CKMBINDEX, TROPONINI in the last 168 hours.  BNP  (last 3 results) Recent Labs    07/08/19 1815 07/11/19 0212 07/14/19 0458  BNP 407.1* 224.8* 55.9    ProBNP (last 3 results) No results for input(s): PROBNP in the last 8760 hours.  Radiological Exams: No results found.  Assessment/Plan Active Problems:   Acute on chronic respiratory failure with hypoxia ([name])   Acute respiratory distress syndrome (ARDS) due to COVID-19 virus ([name])   Hodgkin's lymphoma ([name])   Pneumothorax, acute   Healthcare-associated pneumonia   COVID-19 virus infection   1. Acute on chronic respiratory failure with hypoxia continue with pressure control right now on 40% FiO2 saturation enteric shift immobilization will be continued. 2. Acute respiratory distress no change continue present management 3. Hodgkin's lymphoma treated 4. Pneumothorax no change 5. Healthcare associated pneumonia treated in resolution 6. COVID-19 virus infection in resolution   I have personally seen and evaluated the patient, evaluated laboratory and imaging results, formulated the assessment and plan and placed orders. The Patient requires high complexity decision making with multiple systems involvement.  Rounds were done with the Respiratory Therapy Director and Staff therapists and discussed with nursing staff also.  Allyne Gee, MD [facility] Pulmonary Critical Care Medicine Sleep Medicine

## 2019-10-30 NOTE — Progress Notes (Signed)
Pulmonary Critical Care Medicine [place]   PULMONARY CRITICAL CARE SERVICE  PROGRESS NOTE  Date of Service: 10/30/2019  Steve [name]  [id number]:[id number]  DOB: Apr 11, 1956   DOA: 08/28/2019  Referring Physician: Merton Border, MD  HPI: Steve [name] is a 64 y.o. male seen for follow up of Acute on Chronic Respiratory Failure.  Patient currently is on full support on the ventilator.  Awaiting discharge home with home health  Medications: Reviewed on Rounds  Physical Exam:  Vitals: Temperature 96.4 pulse 79 respiratory rate 32 blood pressure is 118/74 saturations 99%  Ventilator Settings on pressure assist control FiO2 is 40% PEEP 5 IP 28   General: Comfortable at this time  Eyes: Grossly normal lids, irises & conjunctiva  ENT: grossly tongue is normal  Neck: no obvious mass  Cardiovascular: S1 S2 normal no gallop  Respiratory: No rhonchi no rales are noted at this time  Abdomen: soft  Skin: no rash seen on limited exam  Musculoskeletal: not rigid  Psychiatric:unable to assess  Neurologic: no seizure no involuntary movements         Lab Data:   Basic Metabolic Panel: Recent Labs  Lab 10/27/19 0606  NA 138  K 4.3  CL 94*  CO2 35*  GLUCOSE 156*  BUN 20  CREATININE 0.44*  CALCIUM 9.6  MG 1.8    ABG: No results for input(s): PHART, PCO2ART, PO2ART, HCO3, O2SAT in the last 168 hours.  Liver Function Tests: No results for input(s): AST, ALT, ALKPHOS, BILITOT, PROT, ALBUMIN in the last 168 hours. No results for input(s): LIPASE, AMYLASE in the last 168 hours. No results for input(s): AMMONIA in the last 168 hours.  CBC: Recent Labs  Lab 10/27/19 0606  WBC 7.7  HGB 10.5*  HCT 35.8*  MCV 93.7  PLT 185    Cardiac Enzymes: No results for input(s): CKTOTAL, CKMB, CKMBINDEX, TROPONINI in the last 168 hours.  BNP (last 3 results) Recent Labs    07/08/19 1815 07/11/19 0212 07/14/19 0458  BNP 407.1* 224.8* 55.9     ProBNP (last 3 results) No results for input(s): PROBNP in the last 8760 hours.  Radiological Exams: No results found.  Assessment/Plan Active Problems:   Acute on chronic respiratory failure with hypoxia ([name])   Acute respiratory distress syndrome (ARDS) due to COVID-19 virus ([name])   Hodgkin's lymphoma ([name])   Pneumothorax, acute   Healthcare-associated pneumonia   COVID-19 virus infection   1. Acute on chronic respiratory failure hypoxia plan is to continue with pressure control mode patient right now is on 40% FiO2 we will continue to titrate oxygen as deemed necessary.  Patient is awaiting discharge home 2. ARDS treated clinically improving 3. Hodgkin's lymphoma no change 4. Acute pneumothorax resolved 5. Healthcare associated pneumonia treated improving 6. COVID-19 virus infection in recovery   I have personally seen and evaluated the patient, evaluated laboratory and imaging results, formulated the assessment and plan and placed orders. The Patient requires high complexity decision making with multiple systems involvement.  Rounds were done with the Respiratory Therapy Director and Staff therapists and discussed with nursing staff also.  Allyne Gee, MD [facility] Pulmonary Critical Care Medicine Sleep Medicine

## 2019-11-04 ENCOUNTER — Telehealth: Payer: Self-pay | Admitting: Medical

## 2019-11-04 NOTE — Telephone Encounter (Signed)
Appointment length updated

## 2019-11-04 NOTE — Telephone Encounter (Signed)
Pt scheduled for Wednesday. He needs to be 40 minute appointment. Very complicated medical problems. About to start hospice or already started.

## 2019-11-06 ENCOUNTER — Inpatient Hospital Stay: Payer: BC Managed Care – PPO | Admitting: Medical

## 2019-12-03 ENCOUNTER — Telehealth: Payer: Self-pay | Admitting: Medical

## 2019-12-03 NOTE — Telephone Encounter (Signed)
Let pt and wife know that I want them to do virtual visit first before making any decisions. Pt had extremely hard time in hospital and hospice care can be extremely helpful.   I don't recommend stopping hospice care presenlty without knowing exactly what has been going on. Months since last seen..  Want to discuss how he is doing with pt and wife.   Make sure they can do video visit.  Need pulse, bp and o2 sat day of visit. Also make sure in [place].  Need 40 minute appointment slot. He was admitted for months and very complex.

## 2019-12-03 NOTE — Telephone Encounter (Signed)
Appt scheduled for 40 mins , patient is able to get vitals

## 2019-12-03 NOTE — Telephone Encounter (Signed)
Caller [name]  Call Back # [phone]  Patient's spouse is requesting to remove patient from Hospice care and would like for you to resume patient care. Patient is doing well . Patient no longer needs Hospice care.  Please Advise

## 2019-12-06 ENCOUNTER — Telehealth: Payer: Self-pay | Admitting: Medical

## 2019-12-06 ENCOUNTER — Other Ambulatory Visit: Payer: Self-pay

## 2019-12-06 ENCOUNTER — Telehealth (INDEPENDENT_AMBULATORY_CARE_PROVIDER_SITE_OTHER): Payer: BC Managed Care – PPO | Admitting: Medical

## 2019-12-06 ENCOUNTER — Encounter: Payer: Self-pay | Admitting: Medical

## 2019-12-06 VITALS — BP 122/78 | HR 84 | Temp 97.7°F | Wt 130.0 lb

## 2019-12-06 DIAGNOSIS — Z9911 Dependence on respirator [ventilator] status: Secondary | ICD-10-CM | POA: Diagnosis not present

## 2019-12-06 DIAGNOSIS — Z8616 Personal history of COVID-19: Secondary | ICD-10-CM

## 2019-12-06 DIAGNOSIS — Z8709 Personal history of other diseases of the respiratory system: Secondary | ICD-10-CM | POA: Diagnosis not present

## 2019-12-06 DIAGNOSIS — C8190 Hodgkin lymphoma, unspecified, unspecified site: Secondary | ICD-10-CM

## 2019-12-06 MED ORDER — PANTOPRAZOLE SODIUM 40 MG PO TBEC
DELAYED_RELEASE_TABLET | ORAL | 3 refills | Status: DC
Start: 2019-12-06 — End: 2020-04-17

## 2019-12-06 NOTE — Telephone Encounter (Signed)
Will you fax covid vaccine order to  [facility].  Fax [phone].  It is hanging on your [employer].

## 2019-12-06 NOTE — Patient Instructions (Addendum)
Pt with hodgkin's lymphoma with history of covid and severe respiratory issues. No longer desiring hospice but appears to need ventilator. I have talked to patient wife, patient and hospice personal [place].  I have place order for covid vaccine as he is desiring to stop hospice.  I will be calling  [facility] to see if they are able to do home health PT, nursing care and RT in light of he has ventilator.   Will discuss case with Supervising DO and will likely try to get oncology and pulmonologist opinion. See if they would see him in office or virtual visit.  Follow up date to be determined after talking with Dr. Sherrine Maples, Home health and trying to reach out to oncology.

## 2019-12-06 NOTE — Progress Notes (Signed)
Pre visit review using our clinic review tool, if applicable. No additional management support is needed unless otherwise documented below in the visit note. 

## 2019-12-06 NOTE — Progress Notes (Addendum)
Subjective:    Patient ID: Steve [name], male    DOB: July 05, 1955, 64 y.o.   MRN: [id number]  HPI  Virtual Visit via Video Note  I connected with Steve [name] on 12/06/19 at 11:20 AM EDT by a video enabled telemedicine application and verified that I am speaking with the correct person using two identifiers.  Location: Patient: home Provider: office Participants in visit- wife and patient.   I discussed the limitations of evaluation and management by telemedicine and the availability of in person appointments. The patient expressed understanding and agreed to proceed.  History of Present Illness:  Pt in for follow up in spring covid pneumonia with prolonged hospitalization due to respiratory failure. Pt is not seeing oncologist. Hx of Hodgkins Lymphoma and was under treatment but after prolonged covid infection and hosptalzation eventually he was transferred to hospice care after DC from hospital. No longer seeing oncologist.  He is under Med assist hospice still. Pt nurse direct number Steve [name] [phone].   Per wife standard hospice policy is for ventilator to be turned off  Sept 15, 2021.  Hospice wants to discontinue the ventilator at 2 month mark. He is on 7 liters of oxygen.  I talked with Steve [name] who states when he went home pt was going to relinquish use of ventilator after period of time. I think was about 8  Weeks.   Pt hospice is coming out twice a week. He has caretaker that 3 times a week.   Pt weight rt now 130 lb. He has peg feeding tube.  Pt is not on any pain medication presently.   Pt is having heart burn. In the past pt has used protonix.  Described burning sensation in throat with burning.  Hospice wants to discontinue the ventilator at 2 month mark per hospice policy. Pt is currently on 7 liters of oxygen.  Per pt wife and pt he is desiring to stop hospice. Wife indicates Steve [name] not willing to give up and he has 2 young children. They desire PT  presently. This would need to done through home health that has capability to handle ventilator.  Steve [name] states it has been challenging to find home health company that can handle vent. They did eventually [facility]([phone]). Eventually she found [facility] that should be able to send RT, Nurse, PT and possibly OT.   Steve [name] also thought that he not having covid vaccine might be barrier.  He recovered from covid by May. Office fax number for [employer] [phone].  Fax [phone]    Observations/Objective: General-no acute distress, pleasant, oriented. Lungs- on inspection lungs appear unlabored. Neck- no tracheal deviation or jvd on inspection. Neuro- gross motor function appears intact.  Assessment and Plan: Pt with hodgkin's lymphoma with history of covid and severe respiratory issues. No longer desiring hospice but appears to need ventilator. I have talked to patient wife, patient and hospice personal Steve [name].  I have place order for covid vaccine as he is desiring to stop hospice.  I will be calling  [facility] to see if they are able to do home health PT, nursing care and RT in light of he has ventilator.   Will discuss case with Supervising DO and will likely try to get oncology and pulmonologist opinion. See if they would see him in office or virtual visit.  Follow up date to be determined after talking with Dr. Sherrine Maples, Home health and trying to reach out to oncology.  Follow Up Instructions:    I discussed the assessment and treatment plan with the patient. The patient was provided an opportunity to ask questions and all were answered. The patient agreed with the plan and demonstrated an understanding of the instructions.   The patient was advised to call back or seek an in-person evaluation if the symptoms worsen or if the condition fails to improve as anticipated.  Time spent with patient today was 40+  minutes which consisted of chart  review, discussing diagnosis, referring to home health from hospcie, talking with hospice, answering questins treatment and documentation.   Did call [facility] on 12-10-2019. Talked with [facility]. They said they don't ventilator dependant pt.  [employer] copy states wife should call and see if private duty nurse available. Nurse may be able to handle ventilator.   Pt wife Steve [name]  tells me she called Steve [name] at [employer]. Wife gave all the information. They said they would get back with her in day. Fax [phone]. [phone]. Pt wife put on hold and they needed to clarify if they handle ventilator dependant pt  Current Steve [name]  being seen twice a week. 30 minutes at a time. He also has [employer]. Current respirtory therapist comes out one a month. Pt has peg tube. Home health 1200 mg osmolite presently.   They don't have benefit for nurse to stay up to 12 hours daily.  Wife will update me when paperwork processed   [name], PA-C   Review of Systems     Objective:   Physical Exam        Assessment & Plan:

## 2019-12-09 NOTE — Telephone Encounter (Signed)
Where are the rest of the digits?

## 2019-12-09 NOTE — Telephone Encounter (Signed)
Form faxed

## 2019-12-09 NOTE — Telephone Encounter (Signed)
Patient's wife is calling to get an update on her husband being taken off of hospice care. She states they did have the virtual appt on Friday and wants to know if she needs to do anything. Please advise

## 2019-12-09 NOTE — Telephone Encounter (Signed)
Did you fax over the order for his covid vaccine. Also will you let wife know I need to call [facility] and talk to them to see if they are in agreement with proposed plan.   Also I was going to reach out to pt oncologist office to give them update. Can update wife. Super busy yesterday hopefully will have chance to reach out to both.

## 2019-12-09 NOTE — Telephone Encounter (Addendum)
If you would call first number and confirm with [facility] fax number I think fax would be [phone]  [phone].  Fax [phone]

## 2019-12-09 NOTE — Telephone Encounter (Signed)
[phone] 

## 2019-12-10 ENCOUNTER — Telehealth: Payer: Self-pay | Admitting: Medical

## 2019-12-10 NOTE — Telephone Encounter (Signed)
Spoke with patients spouse

## 2019-12-10 NOTE — Telephone Encounter (Signed)
Called michelle  but stated she already spoke with Percell Miller

## 2019-12-10 NOTE — Telephone Encounter (Signed)
Patient's wife called back to speak with Jarrett Soho

## 2019-12-11 ENCOUNTER — Encounter: Payer: Self-pay | Admitting: Medical

## 2019-12-12 ENCOUNTER — Telehealth: Payer: Self-pay | Admitting: Medical

## 2019-12-12 NOTE — Addendum Note (Signed)
Addended by: Anabel Halon on: 12/12/2019 05:40 PM   Modules accepted: Orders

## 2019-12-12 NOTE — Telephone Encounter (Signed)
[facility] cannot accept due to staffing. [facility] does not accept the pts insurance for [facility] and noted they cannot support a vent pt anyway. [facility] is not able to support staffing especially since pt has a vent and they don't have a nurse to dedicate the time.   Is the pt wanting to discharge from hospice or requiring to discharge from hospice? I'm told they offer the best support for vent pt in the home due to current shortages of nursing.     Above note from [place] in referral.

## 2019-12-12 NOTE — Telephone Encounter (Signed)
Recd a call from [facility] stating they are unable to take patient due to staffing. She recommended that referral be sent to  [place] [place].  PT is requesting Home Health for Nursing PT and Home Aid . Please advise

## 2019-12-13 ENCOUNTER — Telehealth: Payer: Self-pay | Admitting: General Practice

## 2019-12-13 NOTE — Telephone Encounter (Signed)
Copied detailed notes from referral for further clarification:   Pt hs hx of hodgkin's lymphoma and while being treated for this got covid. He had 2 month prolonged hospitalization with covid pneumonia an respiratory failure. 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 He was eventually discharge to hospice when stablilized. Hospice nurse states he was initially under agreement that after curtain time period his ventilator would be discontinued. Pt and wife no longer want to be under hospice care but want to continue ventilator as now appears dependant/has been for some time. I am attempting to coordinate home health. Wanted to expidtie pulmonology evaluation as appears most critical component. Pt needs appointmet as soon as possible. Would likely need virutal and next week. Hospice date to take him off machine theoretically about sept 15 th. I have talked with NP [name] who is trach specialist. Thanks for your help. Not specifying office as location that will see quiciker preferred. -----------------  We have no available consult slots open for the month of September.  Sending to Drs Erin Fulling and Hunsucker to see if they are willing to see patient in one of their held slots on their schedule.  Please advise, thanks!

## 2019-12-15 ENCOUNTER — Encounter: Payer: Self-pay | Admitting: Medical

## 2019-12-17 NOTE — Telephone Encounter (Signed)
Dr. Silas Flood, you only have 15 min appt slots. Please advise if you are ok with pt in a 15 min slot or we can look at another provider schedule. Thanks!

## 2019-12-18 ENCOUNTER — Telehealth: Payer: Self-pay | Admitting: Medical

## 2019-12-18 DIAGNOSIS — Z8616 Personal history of COVID-19: Secondary | ICD-10-CM

## 2019-12-18 DIAGNOSIS — C8190 Hodgkin lymphoma, unspecified, unspecified site: Secondary | ICD-10-CM

## 2019-12-18 DIAGNOSIS — Z8709 Personal history of other diseases of the respiratory system: Secondary | ICD-10-CM

## 2019-12-18 NOTE — Telephone Encounter (Addendum)
I put in referral to palliative care. Dr. Charlett Blake thinks they might be able to handle needs home health care type needs. Pt wife has been calling around and home health either won't accept her insurance or cant accept due to complexity.  Will call around and see if palliative care can. Dr. Charlett Blake made commend of profit vs non profit organization.   Current hospice did not offer palliative care.  Also [facility] pulmonology gave them tentative appointment maybe in a month. They are trying to get him earlier. Can you follow up on that?

## 2019-12-19 NOTE — Telephone Encounter (Signed)
Pt now referred to Palliative Care

## 2019-12-19 NOTE — Telephone Encounter (Signed)
I have sent this to [name] for review.

## 2019-12-19 NOTE — Telephone Encounter (Signed)
Should be one of the hybrids

## 2019-12-19 NOTE — Telephone Encounter (Signed)
Called and spoke with pt's wife [name] letting her know that we have sent a message to our Pulmonologists to see if any of them would be comfortable with [place] scheduling pt for a consult. Also stated to her that she might want to have PCP check with [name], NP to see if pt could be able to get an appt with him as he does run the trach clinic. [name] stated that she would check with PCP in regards to that and I stated to her when we had more info for her from our Pulmonologists that we would call her and let her know and she verbalized understanding.

## 2019-12-19 NOTE — Telephone Encounter (Signed)
I personally don't feel comfortable managing this patient for outpatient vent management. If he is not ventilator dependent then I would be happy to see him for trach care, but otherwise this would be outside my experience level.

## 2019-12-19 NOTE — Telephone Encounter (Signed)
Spoke with the pt's spouse and notified of response per Dr [name]  Appt was scheduled for video visit in 30 min slot

## 2019-12-19 NOTE — Telephone Encounter (Signed)
I think a virtual consult is sub-optimal, at least for the first visit, but if there is no other way then this can be done. Needs to be an extended visit, 30 minutes.

## 2019-12-19 NOTE — Telephone Encounter (Signed)
Dr.Dewald can you please advise if you are ok to see this patient as a new consult . Thank you

## 2019-12-19 NOTE — Telephone Encounter (Signed)
Pt's wife calling about appt. Nothing scheduled. Will need to be a virtual consult because pt is home bound/bed ridden and on ventilator. Pt's wife can be reached at [phone].

## 2019-12-19 NOTE — Telephone Encounter (Signed)
Opened to review 

## 2019-12-19 NOTE — Telephone Encounter (Signed)
Due to neither Dr. Erin Fulling or Dr. Silas Flood not feeling comfortable seeing pt for a consult as he is ventilator dependent with trach, sending this to our other pulmonologists to see if any of them would be fine seeing pt as a new consult.  Please advise.

## 2019-12-19 NOTE — Telephone Encounter (Signed)
I thank everyone.  Noticed that patient now has an appointment with Dr. Baltazar Apo.  If the patient cannot make it to that appointment then I can see the patient

## 2019-12-20 ENCOUNTER — Other Ambulatory Visit: Payer: Self-pay

## 2019-12-23 ENCOUNTER — Telehealth: Payer: Self-pay

## 2019-12-23 NOTE — Telephone Encounter (Signed)
Spoke with patient's wife, [name], regarding Palliative care referral. Verbal consent obtained. Visit scheduled for 12/27/19 @ 8:30am

## 2019-12-27 ENCOUNTER — Other Ambulatory Visit: Payer: BC Managed Care – PPO | Admitting: Internal Medicine

## 2019-12-27 ENCOUNTER — Other Ambulatory Visit: Payer: Self-pay

## 2020-01-20 ENCOUNTER — Telehealth (INDEPENDENT_AMBULATORY_CARE_PROVIDER_SITE_OTHER): Payer: BC Managed Care – PPO | Admitting: Emergency Medicine

## 2020-01-20 ENCOUNTER — Encounter: Payer: Self-pay | Admitting: Emergency Medicine

## 2020-01-20 DIAGNOSIS — Z9911 Dependence on respirator [ventilator] status: Secondary | ICD-10-CM

## 2020-01-20 DIAGNOSIS — J9611 Chronic respiratory failure with hypoxia: Secondary | ICD-10-CM | POA: Diagnosis not present

## 2020-01-20 NOTE — Assessment & Plan Note (Signed)
Ventilator dependent 24x7, I do not have his vent settings but I will get these from his DME. The most significant active issues include is bedbound, housebound status because I need to figure out how I am going to get periodic trach changes done. Right now they are regularly changing his inner cannula. He gets all of his suctioning equipment and inner cannula from [employer]. The other pressing issue is that he has been getting his care from [facility], but this been a change in his goals of care and he no longer is planning to come off mechanical ventilation. They have been providing home health nursing, also some RT support. He is going to need an alternative home health company, but is having difficulty finding one who will take on his case due to his ventilator need, etc. There has been some discussion with palliative care of [facility] and this may be an option. We will have to try to find him a new [place], will work on this with our [place].

## 2020-01-20 NOTE — Addendum Note (Signed)
Addended by: Gavin Potters R on: 01/20/2020 03:36 PM   Modules accepted: Orders

## 2020-01-20 NOTE — Progress Notes (Signed)
Virtual Visit via Video Note  I connected with Steve [name] on 01/20/20 at  2:00 PM EDT by a video enabled telemedicine application and verified that I am speaking with the correct person using two identifiers.  Location: Patient: Home Provider: Office   I discussed the limitations of evaluation and management by telemedicine and the availability of in person appointments. The patient expressed understanding and agreed to proceed.   Subjective:    Patient ID: Steve [name], male    DOB: [birth year], 64 y.o.   MRN: [id number]  HPI  64 year old man who has a history of Hodgkin's lymphoma treated with chemotherapy.  Also diabetes, OSA, hypertension, GERD, pancreatitis.  He developed COVID-19 in February 4097, course complicated by progressive dyspnea and hypoxemic respiratory failure.  He required hospital admission for profound hypoxemia 07/08/2019.  He required intubation and mechanical ventilation for ARDS.  There was some question is whether this could be opportunistic infection, pneumonitis due to his chemotherapy.  In absence of any clear relationship between his medications and his respiratory failure was felt this was likely post Covid ARDS.  He unfortunately had staph epidermidis and Candida glabrata pneumonias.  He was treated with these as well as empirically for possible P JP pneumonia given his immunocompromised state.  He had bilateral pneumothoraces requiring chest tubes with associated pneumomediastinum.  These were ultimately able to be discontinued.  He required tracheostomy and PEG tube placement in order to progress his care.  [facility]  At [facility] until about [facility]. Ventilated 24x7. Settings and equipment through [facility]. Wife is doing deep suctioning 2-6x a day. Cream / white, moderate thickness. No blood. He can clear some mucous but not all. He hasn't been out of the house since discharge. HH RN's twice a week, RT once a  month. He was d/c to [facility] with a plan to come off [facility], but they have decided not to do so. They are looking for another [name] because they are going to lose their RN. ?? Whether Hospice / palliative care [name] would be an option.   Trach >> XLT Shiley, 6.0. Has not been changed since d/c to home. He does have inner cannulae that they change every day.   Current vent settings >> RR 24, looks like PCV. Need to obtain from [employer].                                             A summary of his complicated hospitalizations follows:  Severe Acute Hypoxic Respiratory Failure  ARDS  Candida glabrata, staph epidermidis pneumonia Suspected PJP PNA Tracheostomy status Bilateral pneumothoraces status post chest tubes Tension Pneumomediastinum Post COVID - 05/2019 OSA  Discharge Plan: -Continue PRVC 8cc/kg -Wean PEEP / FiO2 for sats >90% -Will need slow vent wean as he physically improves -Follow intermittent CXR with chest tube in place -Chest tube placed to water seal 5/18 with follow up CXR showing no residual pneumothorax.  Can remove chest tube soon if CXR continues to be stable -trach care Q Shift and PRN with inner cannula change -Pulmonary hygiene - mobilize  Adrenal Insufficiency with associated Shock -resolved, no acute follow up -if hypotension, consider repeat stress dose steroids  -continue florinef, midodrine for now, consider slow wean off as he improves  Acute metabolic encephalopathy Hodgkin's Lymphoma - last chemo 3/8  Severe Deconditioning   Discharge Plan: -supportive care -follow up with ONC once discharged from [facility]  -push PT/OT efforts  -OOB, mobilize   Severe Protein Calorie Malnutrition  PEG status Noninfectious diarrhea  GERD  Discharge Plan: -continue TF per Nutrition  -mobilize  -monitor for fever / WBC changes with diarrhea  Stage II sacral decubitus ulcer  Discharge Plan: -continue wound care, small linear decubitus  on sacrum with broken skin  -pressure relief measures   DM   Discharge Plan: -continue SSI -continue lantus -hold home metformin while inpatient   HTN   Discharge Plan: -hold home medications                                           DISCHARGE SUMMARY   64 year old male, with a past medical history of Hodgkin's lymphoma (stage III +, diagnosed in 02/2019) on chemotherapy (brentuximab vedotin, doxorubicin, vinblastine, dacarbazine) prior to admission, who was admitted 3/29 with reports of increasing shortness of breath, saturations into the 50s on supplemental oxygen at home, sore throat with a white coating and decreased oral intake. The patient reportedly had a good response to chemotherapy after assessment with his first follow-up PET scan.  He is followed by Dr. Maylon Peppers / Oncology.  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  During hospitalization the patient had progressive hypoxemic respiratory failure requiring transfer to the intensive care unit.  Initially there was some question that this could be related to possible chemotherapy effect although less than 5%.  As he progressed it was felt that this was likely post Covid ARDS.  ICU course complicated by Candida glabrata, staph epidermis pneumonia.  There was concern potentially that the patient had PJP pneumonia and he was empirically treated as such due to immunocompromised state.  Cultures did not show PJP but he was already on therapy when sampling was obtained.  Additionally, he developed bilateral pneumothoraces s/p chest tubes.  On 4/26 repeat CT of the chest was obtained to assess pneumothoraces with recurrent subcutaneous emphysema (after it had resolved) and it showed a tension pneumomediastinum.  Chest tube suction was increased to -40 cm with improvement in pneumomediastinum and subcutaneous air.  He had a relative adrenal  insufficiency requiring vasopressors and stress dose steroids with improvement. Ultimately, a trach & PEG were placed to allow for rehab efforts.  Right chest tube was removed on 5/13.  He is tolerating brief periods of weaning (12/5 PSV) but deconditioning has been barrier to weaning. He has made significant improvement overall.  The patient was medically cleared for discharge 5/18 to [facility] when bed available.     [place] EVENTS  3/29 Admission to ICU 4/07 Remains on 100%, 60L flow 4/08 Worsening resp failure, failed bipap, intubated. ARDS. Bilateral PTX s/p CTx2 4/19 Remains on vent 60%/PEEP 8, likely to need trach  4/20 trach placed 4/26 Worsening SQ air, CT with tension pneumomediastinum, chest tube suction increased 4/29 On neo 17mg's 4/30 Weaning neo 5/04 weaning phenylephrine profoundly weak 5/05 add solucortef. Dc sxn to 20 bilateral desaturated, suction increased, but not clear that pneumo was actually worse 5/06 Suction once again reduced. Stress dose steroids started, phenylephrine discontinued 5/07 chest tubes changed to waterseal, able to titrate midodrine down 5/10 Worked with PT. Pulled NGT out overnight.  5/11 Improved down to  40%. Says patient has little kids and is fighter and wants full code.  CT chest with improved pneumomediastinum. Significantly fibrotic lung. Resolved right pneumothorax. Small left pneumothorax remains. Still on size 6 tracheostomy. Plan replace with glide scope 5/12 Deconditioned, 40% FiO2, bilateral chest tubes 5/13 R Chest tube removed. S/p PEG. Slowly better stronger 5/17 Trach changed to Shiley #6 XLT                          SIGNIFICANT DIAGNOSTIC STUDIES  CTA chest 3/29 > no PE, diffuse GGO upper lobes, patchy consolidation in bases, peripheral fibrotic changes noted on prior CT chest still present  ECHO 3/30 >LVEF 60 to 76%, grade 1 diastolic dysfunction. Mildly dilated LA, normal RV. Trivial MR  and TR,otherwise normal valves  CTIntraperitoneal air, likely extension of the pneumomediastinum/pneumothorax. An intra-abdominal etiology such as bowel perforation is favored less likely.Extensive pneumomediastinum and partially visualized right pneumothorax. Diarrheal state. Correlation with clinical exam and stool cultures recommended. No bowel obstruction. Normal appendix. Sigmoid diverticulosis  CT head 4/22 > NAICP, sub cutaneous air in neck, bilateral mastoid effusions CT Chest w/o 4/26 >> tension pneumomediastinum, bilateral chest tubes in place, continued widespread pulmonary opacity with development of some bronchiectasis  CT Chest 5/11 >> improved pneumomediastinum.  Significantly fibrotic lung.  Resolved right pneumothorax. Small left pneumothorax remains.   MICRO DATA  UC 3/29 >>negative  BCx2 3/29 >>negative  PJP 4/2 >>negative  Fungitel 3/29 >> positive Blastomyces Antigen 3/20 >> negative Respiratory Culture 4/4 >>staph epidermis >> S-vanco, clinda BAL 4/8 >>candida glabrata Fungal (BAL) 4/8 >>Candida glabrata PJP (BAL) 4/8 >>negative  Aspergillus antigen (BAL) 4/8 >> Negative Tracheal aspirate 4/29 >> negative  ANTIBIOTICS Cefepime 3/29 >>3/31 Vanc 3/29 >>3/30 Azithromycin 3/29 >> 4/1 Fluconazole 3/29 >> 4/13 Meropenem 3/31 >> 4/6 Bactrim 3/31 >> 4/4 Clindamycin 4/5 >>4/20 Primaquine 4/5 >>4/20 Anidulafungin 4/12 >> 4/20 Bactrim (PCP prophylaxis)  xxxxxxxxxxx Vanc 5/13 x 1 dose for peg   CONSULTS PCCM Oncology Interventional radiology Palliative care Infectious disease Wound ostomy continence nurse Speech therapy Transition of care team  TUBES / LINES ETT 4/8 >> 4/20 Trach 4/20 >> Bilateral chest tubes 4/9 >> out at [facility]  PEG 5/13 >>   Review of Systems As per hpi   Past Medical History:  Diagnosis Date  . Abscess of muscle 08/10/2011   staph infection of right hip   . Acute on chronic respiratory failure with hypoxia ([place])    . Acute respiratory distress syndrome (ARDS) due to COVID-19 virus ([place])   . COVID-19 virus infection   . Diabetes mellitus without complication ([place])   . GERD (gastroesophageal reflux disease)    rare reflux - no meds for reflux - NO PROBLEM IN PAST SEVERAL YRS  . HCAP (healthcare-associated pneumonia)   . Healthcare-associated pneumonia   . Hip dysplasia, congenital    no surgery as a child for hip dysplasia - has had bilateral hip replacements as an adult  . Hodgkin lymphoma ([place])   . Hodgkin's lymphoma ([place])   . Hypertension   . Pancreatitis   . Pneumothorax, acute   . Postoperative anemia due to acute blood loss 09/07/2012  . Septic arthritis of hip ([place]) 09/05/2012   PT'S TOTAL HIP JOINT REMOVED - ANTIBIOTIC SPACE PLACED AND PT HAS FINISHED IV ANTIBIOTICS ( PICC LINE REMOVED)  . Sleep apnea    USES CPAP     Family History  Problem Relation Age of Onset  . Melanoma  Mother   . Heart attack Mother   . Hyperlipidemia Neg Hx   . Sudden death Neg Hx   . Hypertension Neg Hx   . Diabetes Neg Hx      Social History   Socioeconomic History  . Marital status: Married    Spouse name: Not on file  . Number of children: Not on file  . Years of education: Not on file  . Highest education level: Not on file  Occupational History  . Not on file  Tobacco Use  . Smoking status: Never Smoker  . Smokeless tobacco: Never Used  Vaping Use  . Vaping Use: Never used  Substance and Sexual Activity  . Alcohol use: Yes    Comment: rare beer. 5 times a year at most. 2 beers when he drinks.  . Drug use: No  . Sexual activity: Yes    Partners: Female  Other Topics Concern  . 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   Welder/fabricator   2 caffeinated beverages daily   Social Determinants of Health   Financial Resource Strain:   . Difficulty of Paying Living Expenses: Not on file  Food Insecurity:   . Worried About Charity fundraiser in the Last Year:  Not on file  . Ran Out of Food in the Last Year: Not on file  Transportation Needs:   . Lack of Transportation (Medical): Not on file  . Lack of Transportation (Non-Medical): Not on file  Physical Activity:   . Days of Exercise per Week: Not on file  . Minutes of Exercise per Session: Not on file  Stress:   . Feeling of Stress : Not on file  Social Connections:   . Frequency of Communication with Friends and Family: Not on file  . Frequency of Social Gatherings with Friends and Family: Not on file  . Attends Religious Services: Not on file  . Active Member of Clubs or Organizations: Not on file  . Attends Archivist Meetings: Not on file  . Marital Status: Not on file  Intimate Partner Violence:   . Fear of Current or Ex-Partner: Not on file  . Emotionally Abused: Not on file  . Physically Abused: Not on file  . Sexually Abused: Not on file     No Known Allergies   Outpatient Medications Prior to Visit  Medication Sig Dispense Refill  . acetaminophen (TYLENOL) 160 MG/5ML solution Place 20.3 mLs (650 mg total) into feeding tube every 4 (four) hours as needed for mild pain or fever (temp > 101.5). (Patient not taking: Reported on 12/06/2019) 120 mL 0  . Amino Acids-Protein Hydrolys (FEEDING SUPPLEMENT, PRO-STAT SUGAR FREE 64,) LIQD Place 30 mLs into feeding tube 2 (two) times daily. 887 mL 0  . atorvastatin (LIPITOR) 10 MG tablet TAKE 1 TABLET BY MOUTH EVERY DAY (Patient not taking: Reported on 12/06/2019) 90 tablet 1  . Chlorhexidine Gluconate Cloth 2 % PADS Apply 6 each topically daily. (Patient not taking: Reported on 12/06/2019)    . chlorhexidine gluconate, MEDLINE KIT, (PERIDEX) 0.12 % solution 15 mLs by Mouth Rinse route 2 (two) times daily. (Patient not taking: Reported on 12/06/2019) 120 mL 0  . docusate (COLACE) 50 MG/5ML liquid Place 10 mLs (100 mg total) into feeding tube daily as needed for mild constipation. (Patient not taking: Reported on 12/06/2019) 100 mL 0  .  enoxaparin (LOVENOX) 40 MG/0.4ML injection Inject 0.4 mLs (40 mg total) into  the skin daily. (Patient not taking: Reported on 12/06/2019) 0 mL   . ferrous sulfate 300 (60 Fe) MG/5ML syrup Place 5 mLs (300 mg total) into feeding tube daily with breakfast. (Patient not taking: Reported on 12/06/2019) 150 mL 3  . fludrocortisone (FLORINEF) 0.1 MG tablet Place 1 tablet (0.1 mg total) into feeding tube daily. (Patient not taking: Reported on 12/06/2019)    . Hydrocortisone (GERHARDT'S BUTT CREAM) CREA Apply 1 application topically as needed for irritation.    . insulin aspart (NOVOLOG) 100 UNIT/ML injection Inject 3-9 Units into the skin every 4 (four) hours. (Patient not taking: Reported on 12/06/2019) 10 mL 11  . insulin glargine (LANTUS) 100 UNIT/ML injection Inject 0.06 mLs (6 Units total) into the skin daily. (Patient not taking: Reported on 12/06/2019) 10 mL 11  . ipratropium-albuterol (DUONEB) 0.5-2.5 (3) MG/3ML SOLN Take 3 mLs by nebulization every 4 (four) hours as needed. (Patient not taking: Reported on 12/06/2019) 360 mL   . Lactobacillus-Inulin (PROBIOTIC DIGESTIVE SUPPORT) CAPS Take by mouth. Digestive Enzymes    . lidocaine-prilocaine (EMLA) cream Apply to affected area once (Patient not taking: Reported on 12/06/2019) 30 g 3  . lip balm (CARMEX) ointment Apply topically as needed for lip care. (Patient not taking: Reported on 12/06/2019) 7 g 0  . loperamide HCl (IMODIUM) 1 MG/7.5ML suspension Place 15 mLs (2 mg total) into feeding tube every 8 (eight) hours as needed for diarrhea or loose stools. (Patient not taking: Reported on 12/06/2019) 120 mL 0  . LORazepam (ATIVAN) 2 MG/ML injection Inject 0.5-1 mLs (1-2 mg total) into the vein every 4 (four) hours as needed for anxiety. 1 mL 0  . midodrine (PROAMATINE) 5 MG tablet Place 1 tablet (5 mg total) into feeding tube with breakfast, with lunch, and with evening meal. (Patient not taking: Reported on 12/06/2019)    . Morphine Sulfate (MORPHINE  CONCENTRATE) 10 mg / 0.5 ml concentrated solution As directed    . Multiple Vitamin (MULTIVITAMIN) LIQD Place 15 mLs into feeding tube daily.    . nutrition supplement, JUVEN, (JUVEN) PACK Place 1 packet into feeding tube 2 (two) times daily between meals.  0  . Nutritional Supplements (FEEDING SUPPLEMENT, VITAL AF 1.2 CAL,) LIQD Place 1,000 mLs into feeding tube continuous.    . ondansetron (ZOFRAN) 4 MG/2ML SOLN injection Inject 2 mLs (4 mg total) into the vein every 6 (six) hours as needed for nausea. (Patient not taking: Reported on 12/06/2019) 2 mL 0  . oxyCODONE (ROXICODONE) 5 MG/5ML solution Place 5 mLs (5 mg total) into feeding tube every 8 (eight) hours. (Patient not taking: Reported on 12/06/2019)  0  . pantoprazole (PROTONIX) 40 MG tablet 1 tab po daily per g tube. 30 tablet 3  . sertraline (ZOLOFT) 50 MG tablet Place 1 tablet (50 mg total) into feeding tube daily.    . sodium chloride 0.9 % infusion Inject 250 mLs into the vein continuous.  0  . sulfamethoxazole-trimethoprim (BACTRIM) 200-40 MG/5ML suspension Place 20 mLs into feeding tube daily. (Patient not taking: Reported on 12/06/2019) 100 mL 0   No facility-administered medications prior to visit.        Objective:   No exam >> Video visit       Assessment & Plan:  Chronic respiratory failure with hypoxia ([name]) Ventilator dependent 24x7, I do not have his vent settings but I will get these from his DME. The most significant active issues include is bedbound, housebound status because I need to figure  out how I am going to get periodic trach changes done. Right now they are regularly changing his inner cannula. He gets all of his suctioning equipment and inner cannula from [employer]. The other pressing issue is that he has been getting his care from [facility], but this been a change in his goals of care and he no longer is planning to come off mechanical ventilation. They have been providing home health nursing, also  some RT support. He is going to need an alternative home health company, but is having difficulty finding one who will take on his case due to his ventilator need, etc. There has been some discussion with palliative care of [facility] and this may be an option. We will have to try to find him a new [place], will work on this with our Economy.  Baltazar Apo, MD, PhD 01/20/2020, 3:30 PM [facility] [phone] or if no answer [phone]

## 2020-01-29 ENCOUNTER — Telehealth: Payer: Self-pay | Admitting: Emergency Medicine

## 2020-01-29 DIAGNOSIS — J9611 Chronic respiratory failure with hypoxia: Secondary | ICD-10-CM

## 2020-01-29 NOTE — Telephone Encounter (Signed)
The order had this in it   Pt needs new DME company to provide [place] and RT for mechincaly vented pt. Pt is also bed bound. Pt is currently with [facility]

## 2020-01-29 NOTE — Telephone Encounter (Signed)
Per Vallarie Mare, she is unable to find a DME that can provide patient with [facility] and RT due to patient being with hospice.  Dr. [name], please advise. Thanks

## 2020-01-29 NOTE — Telephone Encounter (Signed)
Spoke to patient's spouse, [name](DPR). [name] verified that patient is in fact wanting to come off of hospice.  Will route to [facility] pool to address.

## 2020-01-29 NOTE — Telephone Encounter (Signed)
I think the issue now is that they will have to come off hospice, which I think they are willing to do (need to ask his wife this). Hospice is hanging on and caring for him until we can change him to another service, then hospice will be cancelled./

## 2020-01-29 NOTE — Telephone Encounter (Signed)
Vallarie Mare, is this for PT?

## 2020-01-31 NOTE — Telephone Encounter (Signed)
We will have to have and order to get home health for him however we have not had any luck placing ant pt's recently no home care's are taking pt's right now  Joellen Jersey

## 2020-02-04 NOTE — Telephone Encounter (Signed)
I have sent it to  another company to see if they can take him.

## 2020-02-04 NOTE — Telephone Encounter (Signed)
Spoke to another Home health agency they cant take a patient on hospice care they have to be discharged from Hospice they can have pallitive care but not hospice

## 2020-02-04 NOTE — Telephone Encounter (Signed)
Forwarding to Dr [name] to let him know that [facility] is unable to accept pt due to Hospice status.

## 2020-02-04 NOTE — Telephone Encounter (Signed)
Does another order need to be placed for [facility] so the encounter stays open so this doesn't fall through the cracks?

## 2020-02-10 NOTE — Telephone Encounter (Signed)
That is the plan - That he he be discharged from hospice once we confirm that there is a HH entity that can take over

## 2020-02-25 NOTE — Telephone Encounter (Signed)
Steve [name] documented below that she was able to confirm with a home health company that they could accept this pt if he was on palliative care but not on hospice.  I believe this is the information that RB was looking for.    Routing back to RB as FYI.

## 2020-02-25 NOTE — Telephone Encounter (Signed)
PCCs, please advise if this can be closed or if follow up is still needed. Thanks.

## 2020-02-25 NOTE — Telephone Encounter (Signed)
We cant get anyone to take him while he is active on Hospice

## 2020-02-26 NOTE — Telephone Encounter (Signed)
So have we found an alternative [facility] provider? He's not going to be on hospice. Hospice is trying to transition him off their service. Let me know if there is a [facility] service that is willing to talk to me so I can explain what is needed.

## 2020-02-28 NOTE — Telephone Encounter (Signed)
Vallarie Mare can you please advise which home health company was willing to take on patient when he went to palliative care?  Dr. [name] wishes to speak to them about what is needed.  Thanks!

## 2020-03-09 NOTE — Telephone Encounter (Signed)
Im still waiting on a response

## 2020-03-20 NOTE — Telephone Encounter (Signed)
I have sent it to one more company to see if I can get a response.

## 2020-03-23 ENCOUNTER — Other Ambulatory Visit: Payer: Self-pay

## 2020-03-23 ENCOUNTER — Emergency Department (HOSPITAL_COMMUNITY): Payer: BC Managed Care – PPO

## 2020-03-23 ENCOUNTER — Inpatient Hospital Stay (HOSPITAL_COMMUNITY): Payer: BC Managed Care – PPO

## 2020-03-23 ENCOUNTER — Encounter (HOSPITAL_COMMUNITY): Payer: Self-pay | Admitting: Emergency Medicine

## 2020-03-23 ENCOUNTER — Inpatient Hospital Stay (HOSPITAL_COMMUNITY)
Admission: EM | Admit: 2020-03-23 | Discharge: 2020-04-17 | DRG: 207 | Disposition: A | Discharge status: Other Acute Inpatient Hospital | Payer: BC Managed Care – PPO | Attending: Internal Medicine | Admitting: Internal Medicine

## 2020-03-23 DIAGNOSIS — J1282 Pneumonia due to coronavirus disease 2019: Secondary | ICD-10-CM | POA: Diagnosis not present

## 2020-03-23 DIAGNOSIS — Z931 Gastrostomy status: Secondary | ICD-10-CM | POA: Diagnosis not present

## 2020-03-23 DIAGNOSIS — Z9981 Dependence on supplemental oxygen: Secondary | ICD-10-CM

## 2020-03-23 DIAGNOSIS — J9620 Acute and chronic respiratory failure, unspecified whether with hypoxia or hypercapnia: Secondary | ICD-10-CM | POA: Diagnosis not present

## 2020-03-23 DIAGNOSIS — Z9911 Dependence on respirator [ventilator] status: Secondary | ICD-10-CM | POA: Diagnosis not present

## 2020-03-23 DIAGNOSIS — J9503 Malfunction of tracheostomy stoma: Secondary | ICD-10-CM | POA: Diagnosis not present

## 2020-03-23 DIAGNOSIS — U099 Post covid-19 condition, unspecified: Secondary | ICD-10-CM | POA: Diagnosis present

## 2020-03-23 DIAGNOSIS — J9601 Acute respiratory failure with hypoxia: Secondary | ICD-10-CM

## 2020-03-23 DIAGNOSIS — J181 Lobar pneumonia, unspecified organism: Secondary | ICD-10-CM | POA: Diagnosis present

## 2020-03-23 DIAGNOSIS — R1310 Dysphagia, unspecified: Secondary | ICD-10-CM | POA: Diagnosis present

## 2020-03-23 DIAGNOSIS — I10 Essential (primary) hypertension: Secondary | ICD-10-CM | POA: Diagnosis present

## 2020-03-23 DIAGNOSIS — Z6822 Body mass index (BMI) 22.0-22.9, adult: Secondary | ICD-10-CM

## 2020-03-23 DIAGNOSIS — Z8249 Family history of ischemic heart disease and other diseases of the circulatory system: Secondary | ICD-10-CM

## 2020-03-23 DIAGNOSIS — C8190 Hodgkin lymphoma, unspecified, unspecified site: Secondary | ICD-10-CM | POA: Diagnosis not present

## 2020-03-23 DIAGNOSIS — K219 Gastro-esophageal reflux disease without esophagitis: Secondary | ICD-10-CM | POA: Diagnosis not present

## 2020-03-23 DIAGNOSIS — Z930 Tracheostomy status: Secondary | ICD-10-CM | POA: Diagnosis not present

## 2020-03-23 DIAGNOSIS — U071 COVID-19: Secondary | ICD-10-CM | POA: Diagnosis not present

## 2020-03-23 DIAGNOSIS — Z20822 Contact with and (suspected) exposure to covid-19: Secondary | ICD-10-CM | POA: Diagnosis present

## 2020-03-23 DIAGNOSIS — J939 Pneumothorax, unspecified: Secondary | ICD-10-CM | POA: Diagnosis present

## 2020-03-23 DIAGNOSIS — Z7401 Bed confinement status: Secondary | ICD-10-CM | POA: Diagnosis not present

## 2020-03-23 DIAGNOSIS — J80 Acute respiratory distress syndrome: Secondary | ICD-10-CM | POA: Diagnosis not present

## 2020-03-23 DIAGNOSIS — E440 Moderate protein-calorie malnutrition: Secondary | ICD-10-CM | POA: Diagnosis present

## 2020-03-23 DIAGNOSIS — J189 Pneumonia, unspecified organism: Secondary | ICD-10-CM | POA: Diagnosis not present

## 2020-03-23 DIAGNOSIS — R5381 Other malaise: Secondary | ICD-10-CM | POA: Diagnosis present

## 2020-03-23 DIAGNOSIS — J8410 Pulmonary fibrosis, unspecified: Secondary | ICD-10-CM | POA: Diagnosis not present

## 2020-03-23 DIAGNOSIS — Z96643 Presence of artificial hip joint, bilateral: Secondary | ICD-10-CM | POA: Diagnosis present

## 2020-03-23 DIAGNOSIS — J9621 Acute and chronic respiratory failure with hypoxia: Secondary | ICD-10-CM | POA: Diagnosis present

## 2020-03-23 DIAGNOSIS — Z4682 Encounter for fitting and adjustment of non-vascular catheter: Secondary | ICD-10-CM

## 2020-03-23 DIAGNOSIS — Z430 Encounter for attention to tracheostomy: Secondary | ICD-10-CM

## 2020-03-23 DIAGNOSIS — J9383 Other pneumothorax: Principal | ICD-10-CM | POA: Diagnosis present

## 2020-03-23 DIAGNOSIS — E119 Type 2 diabetes mellitus without complications: Secondary | ICD-10-CM | POA: Diagnosis present

## 2020-03-23 DIAGNOSIS — J9690 Respiratory failure, unspecified, unspecified whether with hypoxia or hypercapnia: Secondary | ICD-10-CM

## 2020-03-23 DIAGNOSIS — F419 Anxiety disorder, unspecified: Secondary | ICD-10-CM | POA: Diagnosis present

## 2020-03-23 DIAGNOSIS — Z23 Encounter for immunization: Secondary | ICD-10-CM

## 2020-03-23 HISTORY — DX: Anxiety disorder, unspecified: F41.9

## 2020-03-23 LAB — CBC WITH DIFFERENTIAL/PLATELET
Abs Immature Granulocytes: 0.07 10*3/uL (ref 0.00–0.07)
Basophils Absolute: 0 10*3/uL (ref 0.0–0.1)
Basophils Relative: 0 %
Eosinophils Absolute: 0.2 10*3/uL (ref 0.0–0.5)
Eosinophils Relative: 2 %
HCT: 34.7 % — ABNORMAL LOW (ref 39.0–52.0)
Hemoglobin: 11 g/dL — ABNORMAL LOW (ref 13.0–17.0)
Immature Granulocytes: 1 %
Lymphocytes Relative: 7 %
Lymphs Abs: 1.1 10*3/uL (ref 0.7–4.0)
MCH: 29.5 pg (ref 26.0–34.0)
MCHC: 31.7 g/dL (ref 30.0–36.0)
MCV: 93 fL (ref 80.0–100.0)
Monocytes Absolute: 1.5 10*3/uL — ABNORMAL HIGH (ref 0.1–1.0)
Monocytes Relative: 9 %
Neutro Abs: 12.7 10*3/uL — ABNORMAL HIGH (ref 1.7–7.7)
Neutrophils Relative %: 81 %
Platelets: 219 10*3/uL (ref 150–400)
RBC: 3.73 MIL/uL — ABNORMAL LOW (ref 4.22–5.81)
RDW: 13.5 % (ref 11.5–15.5)
WBC: 15.6 10*3/uL — ABNORMAL HIGH (ref 4.0–10.5)
nRBC: 0 % (ref 0.0–0.2)

## 2020-03-23 LAB — RESP PANEL BY RT-PCR (FLU A&B, COVID) ARPGX2
Influenza A by PCR: NEGATIVE
Influenza B by PCR: NEGATIVE
SARS Coronavirus 2 by RT PCR: NEGATIVE

## 2020-03-23 LAB — COMPREHENSIVE METABOLIC PANEL
ALT: 41 U/L (ref 0–44)
AST: 30 U/L (ref 15–41)
Albumin: 3.4 g/dL — ABNORMAL LOW (ref 3.5–5.0)
Alkaline Phosphatase: 71 U/L (ref 38–126)
Anion gap: 9 (ref 5–15)
BUN: 21 mg/dL (ref 8–23)
CO2: 32 mmol/L (ref 22–32)
Calcium: 9.6 mg/dL (ref 8.9–10.3)
Chloride: 95 mmol/L — ABNORMAL LOW (ref 98–111)
Creatinine, Ser: 0.73 mg/dL (ref 0.61–1.24)
GFR, Estimated: >60 mL/min (ref >60)
Glucose, Bld: 172 mg/dL — ABNORMAL HIGH (ref 70–99)
Potassium: 4.7 mmol/L (ref 3.5–5.1)
Sodium: 136 mmol/L (ref 135–145)
Total Bilirubin: 0.4 mg/dL (ref 0.3–1.2)
Total Protein: 8.4 g/dL — ABNORMAL HIGH (ref 6.5–8.1)

## 2020-03-23 LAB — GLUCOSE, CAPILLARY
Glucose-Capillary: 103 mg/dL — ABNORMAL HIGH (ref 70–99)
Glucose-Capillary: 117 mg/dL — ABNORMAL HIGH (ref 70–99)

## 2020-03-23 LAB — TROPONIN I (HIGH SENSITIVITY): Troponin I (High Sensitivity): 16 ng/L (ref <18)

## 2020-03-23 LAB — APTT: aPTT: 35 seconds (ref 24–36)

## 2020-03-23 LAB — MRSA PCR SCREENING: MRSA by PCR: NEGATIVE

## 2020-03-23 LAB — HEMOGLOBIN A1C
Hgb A1c MFr Bld: 5.7 % — ABNORMAL HIGH (ref 4.8–5.6)
Mean Plasma Glucose: 116.89 mg/dL

## 2020-03-23 LAB — LACTIC ACID, PLASMA: Lactic Acid, Venous: 1.5 mmol/L (ref 0.5–1.9)

## 2020-03-23 LAB — PROTIME-INR
INR: 1.2 (ref 0.8–1.2)
Prothrombin Time: 14.5 seconds (ref 11.4–15.2)

## 2020-03-23 MED ORDER — DOCUSATE SODIUM 100 MG PO CAPS: 100.0000 mg | ORAL_CAPSULE | Freq: Two times a day (BID) | ORAL | Status: DC | PRN

## 2020-03-23 MED ORDER — MIDAZOLAM HCL 2 MG/2ML IJ SOLN
2.0000 mg | INTRAMUSCULAR | Status: DC | PRN
  Administered 2020-03-23: 13:00:00 1 mg via INTRAVENOUS

## 2020-03-23 MED ORDER — STERILE WATER FOR INJECTION IJ SOLN
INTRAMUSCULAR | Status: AC
  Filled 2020-03-23: qty 10

## 2020-03-23 MED ORDER — PANTOPRAZOLE SODIUM 40 MG PO TBEC: 40.0000 mg | DELAYED_RELEASE_TABLET | Freq: Every day | ORAL | Status: DC

## 2020-03-23 MED ORDER — ACETAMINOPHEN 160 MG/5ML PO SOLN
650.0000 mg | ORAL | Status: DC | PRN
  Administered 2020-03-24 – 2020-04-16 (×21): 650 mg
  Filled 2020-03-23 (×22): qty 20.3

## 2020-03-23 MED ORDER — SERTRALINE HCL 50 MG PO TABS
50.0000 mg | ORAL_TABLET | Freq: Every day | ORAL | Status: DC
  Administered 2020-03-24 – 2020-04-17 (×25): 50 mg
  Filled 2020-03-23 (×25): qty 1

## 2020-03-23 MED ORDER — FENTANYL CITRATE (PF) 100 MCG/2ML IJ SOLN
50.0000 ug | INTRAMUSCULAR | Status: DC | PRN
  Administered 2020-03-23: 25 ug via INTRAVENOUS
  Administered 2020-03-24: 50 ug via INTRAVENOUS
  Administered 2020-03-24: 100 ug via INTRAVENOUS
  Filled 2020-03-23 (×2): qty 2

## 2020-03-23 MED ORDER — OSMOLITE 1.2 CAL PO LIQD
1000.0000 mL | ORAL | Status: DC
  Administered 2020-03-23: 18:00:00 1000 mL

## 2020-03-23 MED ORDER — DEXMEDETOMIDINE HCL IN NACL 400 MCG/100ML IV SOLN
0.0000 ug/kg/h | INTRAVENOUS | Status: DC
  Administered 2020-03-23: 13:00:00 0.4 ug/kg/h via INTRAVENOUS
  Filled 2020-03-23: qty 100

## 2020-03-23 MED ORDER — PROPOFOL 500 MG/50ML IV EMUL
INTRAVENOUS | Status: AC
  Filled 2020-03-23: qty 50

## 2020-03-23 MED ORDER — POLYETHYLENE GLYCOL 3350 17 G PO PACK: 17.0000 g | PACK | Freq: Every day | ORAL | Status: DC | PRN

## 2020-03-23 MED ORDER — NOREPINEPHRINE 4 MG/250ML-% IV SOLN
0.0000 ug/min | INTRAVENOUS | Status: DC
  Administered 2020-03-23: 15:00:00 2 ug/min via INTRAVENOUS
  Administered 2020-03-24: 10:00:00 1 ug/min via INTRAVENOUS
  Administered 2020-03-26: 14:00:00 2 ug/min via INTRAVENOUS
  Administered 2020-03-27: 21:00:00 1 ug/min via INTRAVENOUS
  Filled 2020-03-23 (×5): qty 250

## 2020-03-23 MED ORDER — CHLORHEXIDINE GLUCONATE 0.12% ORAL RINSE (MEDLINE KIT)
15.0000 mL | Freq: Two times a day (BID) | OROMUCOSAL | Status: DC
  Administered 2020-03-23 – 2020-04-06 (×25): 15 mL via OROMUCOSAL

## 2020-03-23 MED ORDER — MIDAZOLAM HCL 2 MG/2ML IJ SOLN: 2.0000 mg | INTRAMUSCULAR | Status: DC | PRN

## 2020-03-23 MED ORDER — SODIUM CHLORIDE 0.9 % IV SOLN
2.0000 g | INTRAVENOUS | Status: AC
  Administered 2020-03-23 – 2020-03-27 (×5): 2 g via INTRAVENOUS
  Filled 2020-03-23 (×4): qty 20
  Filled 2020-03-23: qty 2

## 2020-03-23 MED ORDER — ONDANSETRON HCL 4 MG/2ML IJ SOLN: 4.0000 mg | Freq: Four times a day (QID) | INTRAMUSCULAR | Status: DC | PRN

## 2020-03-23 MED ORDER — ETOMIDATE 2 MG/ML IV SOLN
INTRAVENOUS | Status: AC
  Administered 2020-03-23: 13:00:00 10 mg
  Filled 2020-03-23: qty 20

## 2020-03-23 MED ORDER — LACTATED RINGERS IV BOLUS
500.0000 mL | Freq: Once | INTRAVENOUS | Status: AC
  Administered 2020-03-23: 15:00:00 500 mL via INTRAVENOUS

## 2020-03-23 MED ORDER — MIDODRINE HCL 5 MG PO TABS: 5.0000 mg | ORAL_TABLET | Freq: Three times a day (TID) | ORAL | Status: DC

## 2020-03-23 MED ORDER — SODIUM CHLORIDE 0.9% FLUSH: 10.0000 mL | Freq: Three times a day (TID) | INTRAVENOUS | Status: DC

## 2020-03-23 MED ORDER — FAMOTIDINE 20 MG IN NS 100 ML IVPB
20.0000 mg | Freq: Two times a day (BID) | INTRAVENOUS | Status: DC
  Administered 2020-03-23: 22:00:00 20 mg via INTRAVENOUS
  Filled 2020-03-23 (×3): qty 100

## 2020-03-23 MED ORDER — ALBUTEROL SULFATE (2.5 MG/3ML) 0.083% IN NEBU: 2.5000 mg | INHALATION_SOLUTION | RESPIRATORY_TRACT | Status: DC | PRN

## 2020-03-23 MED ORDER — ATORVASTATIN CALCIUM 10 MG PO TABS: 10.0000 mg | ORAL_TABLET | Freq: Every day | ORAL | Status: DC

## 2020-03-23 MED ORDER — FLUDROCORTISONE ACETATE 0.1 MG PO TABS: 0.1000 mg | ORAL_TABLET | Freq: Every day | ORAL | Status: DC

## 2020-03-23 MED ORDER — INSULIN ASPART 100 UNIT/ML ~~LOC~~ SOLN
0.0000 [IU] | Freq: Every day | SUBCUTANEOUS | Status: DC
  Administered 2020-04-10: 2 [IU] via SUBCUTANEOUS

## 2020-03-23 MED ORDER — MIDAZOLAM HCL 2 MG/2ML IJ SOLN
INTRAMUSCULAR | Status: AC
  Administered 2020-03-23: 13:00:00 2 mg via INTRAVENOUS
  Filled 2020-03-23: qty 4

## 2020-03-23 MED ORDER — ORAL CARE MOUTH RINSE
15.0000 mL | OROMUCOSAL | Status: DC
  Administered 2020-03-23 – 2020-04-07 (×97): 15 mL via OROMUCOSAL

## 2020-03-23 MED ORDER — ACETAMINOPHEN 325 MG PO TABS: 650.0000 mg | ORAL_TABLET | ORAL | Status: DC | PRN

## 2020-03-23 MED ORDER — ENOXAPARIN SODIUM 40 MG/0.4ML ~~LOC~~ SOLN
40.0000 mg | SUBCUTANEOUS | Status: DC
  Administered 2020-03-23 – 2020-04-16 (×25): 40 mg via SUBCUTANEOUS
  Filled 2020-03-23 (×25): qty 0.4

## 2020-03-23 MED ORDER — FENTANYL CITRATE (PF) 100 MCG/2ML IJ SOLN
50.0000 ug | INTRAMUSCULAR | Status: AC | PRN
  Administered 2020-03-23 (×2): 50 ug via INTRAVENOUS
  Filled 2020-03-23 (×2): qty 2

## 2020-03-23 MED ORDER — VECURONIUM BROMIDE 10 MG IV SOLR
INTRAVENOUS | Status: AC
  Filled 2020-03-23: qty 10

## 2020-03-23 MED ORDER — CHLORHEXIDINE GLUCONATE CLOTH 2 % EX PADS
6.0000 | MEDICATED_PAD | Freq: Every day | CUTANEOUS | Status: DC
  Administered 2020-03-23 – 2020-04-17 (×25): 6 via TOPICAL

## 2020-03-23 MED ORDER — LACTATED RINGERS IV BOLUS (SEPSIS)
1000.0000 mL | Freq: Once | INTRAVENOUS | Status: AC
  Administered 2020-03-23: 1000 mL via INTRAVENOUS

## 2020-03-23 MED ORDER — POLYETHYLENE GLYCOL 3350 17 G PO PACK
17.0000 g | PACK | Freq: Every day | ORAL | Status: DC
  Administered 2020-03-23: 16:00:00 17 g
  Filled 2020-03-23: qty 1

## 2020-03-23 MED ORDER — FAMOTIDINE IN NACL 20-0.9 MG/50ML-% IV SOLN
20.0000 mg | Freq: Two times a day (BID) | INTRAVENOUS | Status: DC
  Filled 2020-03-23: qty 50

## 2020-03-23 MED ORDER — DOCUSATE SODIUM 50 MG/5ML PO LIQD
100.0000 mg | Freq: Two times a day (BID) | ORAL | Status: DC
  Administered 2020-03-23: 22:00:00 100 mg
  Filled 2020-03-23: qty 10

## 2020-03-23 MED ORDER — PANTOPRAZOLE SODIUM 40 MG PO PACK
40.0000 mg | PACK | Freq: Every day | ORAL | Status: DC
  Administered 2020-03-24 – 2020-04-17 (×25): 40 mg
  Filled 2020-03-23 (×25): qty 20

## 2020-03-23 MED ORDER — INSULIN ASPART 100 UNIT/ML ~~LOC~~ SOLN
0.0000 [IU] | Freq: Three times a day (TID) | SUBCUTANEOUS | Status: DC
  Administered 2020-03-24: 16:00:00 3 [IU] via SUBCUTANEOUS
  Administered 2020-03-24 – 2020-03-30 (×10): 2 [IU] via SUBCUTANEOUS
  Administered 2020-03-31: 08:00:00 3 [IU] via SUBCUTANEOUS
  Administered 2020-04-01 – 2020-04-03 (×5): 2 [IU] via SUBCUTANEOUS
  Administered 2020-04-04: 09:00:00 3 [IU] via SUBCUTANEOUS
  Administered 2020-04-05 – 2020-04-06 (×3): 2 [IU] via SUBCUTANEOUS
  Administered 2020-04-06: 17:00:00 3 [IU] via SUBCUTANEOUS
  Administered 2020-04-07: 08:00:00 2 [IU] via SUBCUTANEOUS
  Administered 2020-04-08: 12:00:00 3 [IU] via SUBCUTANEOUS
  Administered 2020-04-08: 08:00:00 2 [IU] via SUBCUTANEOUS
  Administered 2020-04-09 (×2): 3 [IU] via SUBCUTANEOUS
  Administered 2020-04-10 – 2020-04-11 (×2): 2 [IU] via SUBCUTANEOUS
  Administered 2020-04-11: 3 [IU] via SUBCUTANEOUS
  Administered 2020-04-12 – 2020-04-13 (×4): 2 [IU] via SUBCUTANEOUS
  Administered 2020-04-13: 3 [IU] via SUBCUTANEOUS
  Administered 2020-04-14 – 2020-04-17 (×6): 2 [IU] via SUBCUTANEOUS

## 2020-03-23 MED ORDER — FENTANYL CITRATE (PF) 100 MCG/2ML IJ SOLN
INTRAMUSCULAR | Status: AC
  Administered 2020-03-23: 13:00:00 50 ug via INTRAVENOUS
  Filled 2020-03-23: qty 2

## 2020-03-23 NOTE — Progress Notes (Signed)
[facility] PCCM  CXR report reviewed, discussed with Dr. Dina Rich Tracheal balloon diameter appears to exceed the trachea diameter Unclear if this represents tracheal injury, he is certainly at risk for that since his tracheostomy tube has not been  Plan chest tube first, then will inspect airway and replace tracheostomy   [name], MD [place] PCCM Pager: [phone] Cell: [phone] If no response, call [phone]

## 2020-03-23 NOTE — ED Notes (Signed)
RT able to insert current trach tube into better position, pts O2 sats remained in high 90's throughout.  RT established etCO2 colormetric color change on BVM to ensure proper placement.  Pt reports no distress at this time, no further needs expressed.

## 2020-03-23 NOTE — Procedures (Signed)
Bronchoscopy Procedure Note  Steve [name]  [id number]  April 18, 1955  Date:03/23/20  Time:1:39 PM   Provider Performing:Brent Astha Probasco   Procedure(s):  Flexible Bronchoscopy [phone])  Indication(s) Dislodged tracheostomy tube  Consent Risks of the procedure as well as the alternatives and risks of each were explained to the patient and/or caregiver.  Consent for the procedure was obtained and is signed in the bedside chart  Anesthesia Moderate sedation in ICU: fentanyl bolus, versed bolus, precedex infusion, etomidate injection   Time Out Verified patient identification, verified procedure, site/side was marked, verified correct patient position, special equipment/implants available, medications/allergies/relevant history reviewed, required imaging and test results available.   Sterile Technique Usual hand hygiene, masks, gowns, and gloves were used   Procedure Description Bronchoscope advanced through tracheostomy tube and into airway.  Airways were examined down to subsegmental level with findings noted below.   Following diagnostic evaluation, BAL(s) performed in RML with normal saline and return of 10cc fluid  Findings: The camera was passed through the existing tracheostomy tube and into the airway, it appeared that the tube was not in the appropriate position as the orifice of the tube was abutting the posterior wall of the trachea.  The carina was sharp, there was acute appearing inflammation R>L with thin white, non-obstructing secretions.  The old tracheostomy tube was then removed, the new #6 distal XLT tracheostomy was loaded on the scope and the scope was passed into the stoma.  The stoma was large with significant beefy red tissue circumferential to the stoma.  The scope was easily passed into the trachea and there was no visible tracheal wall injury noted on inspection, though the view was obscured somewhat by respiratory motion.  The scope was passed to the carina, then  the new tracheostomy tube was put in position.  A bal was then performed in the RML.   Complications/Tolerance None; patient tolerated the procedure well. Chest X-ray is needed post procedure.   EBL none   Specimen(s) RML BAL for culture  [name], MD [place] [facility] Pager: [phone] Cell: [phone] If no response, call [phone]

## 2020-03-23 NOTE — ED Triage Notes (Addendum)
Pt BIBA from home-  Per EMS- Pt has had productive cough, increased secretions since Friday.  This morning, pts tracheostomy became dislodged, EMS reports sats initially in 70's. EMS personnel able to partially replace trach tube, increased O2 sats to 96%.    Pt arrives with home vent.   On arrival to ED RT called to bedside, EDP Horton called to bedside.

## 2020-03-23 NOTE — Progress Notes (Signed)
Brief Nutrition Note  Consult received for enteral/tube feeding initiation and management.  Adult Enteral Nutrition Protocol initiated. Full assessment to follow. Patient has PEG from PTA.   Admitting Dx: Pneumothorax on right [J93.9]  Body mass index is 22.09 kg/m. Pt meets criteria for normal weight based on current BMI.  Labs:  Recent Labs  Lab 03/23/20 1042  NA 136  K 4.7  CL 95*  CO2 32  BUN 21  CREATININE 0.73  CALCIUM 9.6  GLUCOSE 172*       Jarome Matin, MS, RD, LDN, CNSC Inpatient Clinical Dietitian RD pager # available in [facility]  After hours/weekend pager # available in [facility]

## 2020-03-23 NOTE — H&P (Signed)
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  History of present illness   This is a pleasant 64 year old male whom is well-known to the pulmonary and critical care service from his hospitalization in the spring 2021 with Covid pneumonia.  At baseline he has lymphoma and had been receiving treatment for the same around the time of his original diagnosis with COVID.  He remained ventilator dependent during this hospital stay [facility]  He stayed there for several weeks and then went home on a home ventilator.  According to his sister who provides the history today he has been bedbound since then, though in general his strength and his faculties have returned.  He is now able to use a cell phone to communicate with folks, his hair has grown back.  She does note that care for him at home has been somewhat Spartan and that he has not had his tracheostomy changed in months.  Per chart review there was initially some consideration for home hospice however by October 2021 when he was seen in a virtual visit with Dr. [name] it was noted that he desired a full scope of care. According to he and his sister he developed increasing chest tightness across his chest approximately 2 days prior to admission.  He has had an increase in cough and brown mucus production.  This morning he was noted to have thick mucus and when suctioned by his home health nurse the tracheostomy dislodged so he was brought to the [facility] emergency department.  Here the  tracheostomy was put back into position by the respiratory therapy and emergency physician team.  He was noted on chest imaging to have a right-sided pneumothorax.  He is currently breathing with the assistance of his home ventilator, his vital signs are stable.  Pulmonary and critical care medicine was consulted for further evaluation.  Past Medical History  Sleep apnea, on CPAP at home Post Covid pneumonia pulmonary fibrosis, on mechanical ventilation Chronic respiratory failure with hypoxemia on mechanical ventilation at home Hodgkin's lymphoma Hypertension History of pancreatitis Gastroesophageal reflux disease Diabetes mellitus type 2  Significant Hospital Events   December 13 admission  Consults:    Procedures:  Tracheostomy present prior to admission  Significant Diagnostic Tests:  September 13 chest x-ray showed right-sided pneumothorax, chronic fibrotic changes, tracheostomy in place  Micro Data:  December 13 respiratory culture December 13 SARS-CoV-2/influenza pending  Antimicrobials:  December 13 ceftriaxone   Interim history/subjective:  As above  Objective   Blood pressure 97/72, pulse (!) 104, temperature 97.9 F (36.6 C), temperature source Oral, resp. rate 19, SpO2 100 %.    Vent Mode: PCV Set Rate:  [28 bmp] 28 bmp PEEP:  [8 cmH20] 8 cmH20  No intake or output data in the 24 hours ending 03/23/20 1144 There were no vitals filed for this visit.  Examination:  General:  Resting comfortably in bed HENT: NCAT OP clear, tracheostomy with brown secretions coming around it PULM: Diminished on R, coarse rhonchi on left, normal effort CV:  Tachy, no mgr GI: BS+, soft, nontender MSK: diminished bulk and tone Neuro: awake, alert, [facility] Problem list     Assessment & Plan:  Acute on chronic respiratory failure with hypoxemia due to right pneumothorax Question community-acquired pneumonia Chronic respiratory failure dependent on home  ventilator Tracheostomy status Admit to ICU Replace tracheostomy after admission Change from home ventilator t hospital ventilator Place IV access and home port Placed right chest tube after ICU admission Chest x-ray daily  Community-acquired pneumonia: Respiratory culture Ceftriaxone  Tachycardia: I do not feel that he is septic, this is most likely related to his pneumothorax Treat pneumothorax as documented above Telemetry monitoring  Anxiety Need for sedation for ventilator synchrony during procedure: PAD protocol, RASS target 0 to -1 Precedex infusion As needed Versed and fentanyl After chest tube stop Precedex  Physical deconditioning History of lymphoma, untreated Goals of care, uncertain: At this time family desires full scope of practice but considering duration of his illness will need to formally adjust goals of care with his family after placing chest tube, replacing tracheostomy.  DM2 SSI, q4H POC monitoring   Best practice (evaluated daily)   Diet: npo Pain/Anxiety/Delirium protocol (if indicated): as above VAP protocol (if indicated): yes DVT prophylaxis: lovenox GI prophylaxis: Pantoprazole for stress ulcer prophylaxis Glucose control: as above Mobility: bed rest for now last date of multidisciplinary goals of care discussion 12/13 Family and staff present sister Mariann Laster, [name], Dr. Dina Rich Summary of discussion see above Follow up goals of care discussion due 12/14 Code Status: Full Disposition: admit to ICU  Labs   CBC: Recent Labs  Lab 03/23/20 1042  WBC 15.6*  NEUTROABS 12.7*  HGB 11.0*  HCT 34.7*  MCV 93.0  PLT 742    Basic Metabolic Panel: Recent Labs  Lab 03/23/20 1042  NA 136  K 4.7  CL 95*  CO2 32  GLUCOSE 172*  BUN 21  CREATININE 0.73  CALCIUM 9.6   GFR: CrCl cannot be calculated (Unknown ideal weight.). Recent Labs  Lab 03/23/20 1042  WBC 15.6*  LATICACIDVEN 1.5    Liver Function Tests: Recent Labs  Lab  03/23/20 1042  AST 30  ALT 41  ALKPHOS 71  BILITOT 0.4  PROT 8.4*  ALBUMIN 3.4*   No results for input(s): LIPASE, AMYLASE in the last 168 hours. No results for input(s): AMMONIA in the last 168 hours.  ABG    Component Value Date/Time   PHART 7.388 08/29/2019 0538   PCO2ART 70.3 (HH) 08/29/2019 0538   PO2ART 68.4 (L) 08/29/2019 0538   HCO3 52.1 (H) 09/18/2019 1025   TCO2 28 07/08/2019 1331   O2SAT 67.3 09/18/2019 1025     Coagulation Profile: Recent Labs  Lab 03/23/20 1042  INR 1.2    Cardiac Enzymes: No results for input(s): CKTOTAL, CKMB, CKMBINDEX, TROPONINI in the last 168 hours.  HbA1C: Hgb A1c MFr Bld  Date/Time Value Ref Range Status  07/09/2019 05:10 AM 7.3 (H) 4.8 - 5.6 % Final    Comment:    (NOTE) Pre diabetes:          5.7%-6.4% Diabetes:              >6.4% Glycemic control for   <7.0% adults with diabetes   05/19/2019 03:56 AM 8.3 (H) 4.8 - 5.6 % Final    Comment:    (NOTE) Pre diabetes:          5.7%-6.4% Diabetes:              >  6.4% Glycemic control for   <7.0% adults with diabetes     CBG: No results for input(s): GLUCAP in the last 168 hours.  Review of Systems:   Cannot obtain due to intubation/mechanical ventilation  Past Medical History  He,  has a past medical history of Abscess of muscle (08/10/2011), Acute on chronic respiratory failure with hypoxia ([place]), Acute respiratory distress syndrome (ARDS) due to COVID-19 virus ([place]), COVID-19 virus infection, Diabetes mellitus without complication ([place]), GERD (gastroesophageal reflux disease), HCAP (healthcare-associated pneumonia), Healthcare-associated pneumonia, Hip dysplasia, congenital, Hodgkin lymphoma ([place]), Hodgkin's lymphoma ([place]), Hypertension, Pancreatitis, Pneumothorax, acute, Postoperative anemia due to acute blood loss (09/07/2012), Septic arthritis of hip ([place]) (09/05/2012), and Sleep apnea.   Surgical History    Past Surgical History:  Procedure Laterality Date  .  COLONOSCOPY  04/26/2007  . HERNIA REPAIR     inguinal hernia x3  . IR GASTROSTOMY TUBE MOD SED  08/22/2019  . IR IMAGING GUIDED PORT INSERTION  03/29/2019  . JOINT REPLACEMENT  2002 & 2007   bilateral hip replacement  . LYMPH NODE BIOPSY Right 03/20/2019   Procedure: DEEP RIGHT SUPRACLAVICULAR LYMPH NODE EXCISION;  Surgeon: Fanny Skates, MD;  Location: [facility];  Service: General;  Laterality: Right;  . MULTIPLE EXTRACTIONS WITH ALVEOLOPLASTY  07/28/2011   Procedure: MULTIPLE EXTRACION WITH ALVEOLOPLASTY;  Surgeon: Lenn Cal, DDS;  Location: [facility];  Service: Oral Surgery;  Laterality: N/A;  Extraction of tooth #'s 2,3,4,5,6,11,12,13,15,19,22 with alveoloplasty.  . shoulder repair - right for separation of shoulder    . TEE WITHOUT CARDIOVERSION  07/29/2011   Procedure: TRANSESOPHAGEAL ECHOCARDIOGRAM (TEE);  Surgeon: Josue Hector, MD;  Location: [facility];  Service: Cardiovascular;  Laterality: N/A;  . TOTAL HIP REVISION Right 09/05/2012   Procedure: RIGHT HIP RESECTION ARTHROPLASTY WITH ANTIBIOTIC SPACERS;  Surgeon: [name], MD;  Location: [facility];  Service: Orthopedics;  Laterality: Right;  . TOTAL HIP REVISION Right 11/30/2012   Procedure: RIGHT TOTAL HIP ARTHROPLASTY REIMPLANTATION;  Surgeon: [name], MD;  Location: [facility];  Service: Orthopedics;  Laterality: Right;     Social History   reports that he has never smoked. He has never used smokeless tobacco. He reports current alcohol use. He reports that he does not use drugs.   Family History   His family history includes Heart attack in his mother; Melanoma in his mother. There is no history of Hyperlipidemia, Sudden death, Hypertension, or Diabetes.   Allergies No Known Allergies   Home Medications  Prior to Admission medications   Medication Sig Start Date End Date Taking? Authorizing Provider  acetaminophen (TYLENOL) 160 MG/5ML solution Place 20.3 mLs (650 mg total) into feeding tube  every 4 (four) hours as needed for mild pain or fever (temp > 101.5). Patient not taking: Reported on 12/06/2019 08/27/19   Donita Brooks, NP  Amino Acids-Protein Hydrolys (FEEDING SUPPLEMENT, PRO-STAT SUGAR FREE 64,) LIQD Place 30 mLs into feeding tube 2 (two) times daily. 08/27/19   Donita Brooks, NP  atorvastatin (LIPITOR) 10 MG tablet TAKE 1 TABLET BY MOUTH EVERY DAY Patient not taking: Reported on 12/06/2019 10/07/19   Saguier, Percell Miller, PA-C  Chlorhexidine Gluconate Cloth 2 % PADS Apply 6 each topically daily. Patient not taking: Reported on 12/06/2019 08/28/19   Donita Brooks, NP  chlorhexidine gluconate, MEDLINE KIT, (PERIDEX) 0.12 % solution 15 mLs by Mouth Rinse route 2 (two) times daily. Patient not taking: Reported on 12/06/2019 08/27/19   Donita Brooks,  NP  docusate (COLACE) 50 MG/5ML liquid Place 10 mLs (100 mg total) into feeding tube daily as needed for mild constipation. Patient not taking: Reported on 12/06/2019 08/27/19   Donita Brooks, NP  enoxaparin (LOVENOX) 40 MG/0.4ML injection Inject 0.4 mLs (40 mg total) into the skin daily. Patient not taking: Reported on 12/06/2019 08/27/19   Donita Brooks, NP  ferrous sulfate 300 (60 Fe) MG/5ML syrup Place 5 mLs (300 mg total) into feeding tube daily with breakfast. Patient not taking: Reported on 12/06/2019 08/28/19   Donita Brooks, NP  fludrocortisone (FLORINEF) 0.1 MG tablet Place 1 tablet (0.1 mg total) into feeding tube daily. Patient not taking: Reported on 12/06/2019 08/28/19   Donita Brooks, NP  Hydrocortisone (GERHARDT'S BUTT CREAM) CREA Apply 1 application topically as needed for irritation. 08/27/19   Donita Brooks, NP  insulin aspart (NOVOLOG) 100 UNIT/ML injection Inject 3-9 Units into the skin every 4 (four) hours. Patient not taking: Reported on 12/06/2019 08/27/19   Donita Brooks, NP  insulin glargine (LANTUS) 100 UNIT/ML injection Inject 0.06 mLs (6 Units total) into the skin daily. Patient not taking: Reported on  12/06/2019 08/28/19   Donita Brooks, NP  ipratropium-albuterol (DUONEB) 0.5-2.5 (3) MG/3ML SOLN Take 3 mLs by nebulization every 4 (four) hours as needed. Patient not taking: Reported on 12/06/2019 08/27/19   Donita Brooks, NP  Lactobacillus-Inulin (PROBIOTIC DIGESTIVE SUPPORT) CAPS Take by mouth. Digestive Enzymes    [provider]  lidocaine-prilocaine (EMLA) cream Apply to affected area once Patient not taking: Reported on 12/06/2019 03/25/19   Tish Men, MD  lip balm (CARMEX) ointment Apply topically as needed for lip care. Patient not taking: Reported on 12/06/2019 08/27/19   Donita Brooks, NP  loperamide HCl (IMODIUM) 1 MG/7.5ML suspension Place 15 mLs (2 mg total) into feeding tube every 8 (eight) hours as needed for diarrhea or loose stools. Patient not taking: Reported on 12/06/2019 08/27/19   Donita Brooks, NP  LORazepam (ATIVAN) 2 MG/ML injection Inject 0.5-1 mLs (1-2 mg total) into the vein every 4 (four) hours as needed for anxiety. 08/27/19   Donita Brooks, NP  midodrine (PROAMATINE) 5 MG tablet Place 1 tablet (5 mg total) into feeding tube with breakfast, with lunch, and with evening meal. Patient not taking: Reported on 12/06/2019 08/27/19   Donita Brooks, NP  Morphine Sulfate (MORPHINE CONCENTRATE) 10 mg / 0.5 ml concentrated solution As directed 11/10/19   [provider]  Multiple Vitamin (MULTIVITAMIN) LIQD Place 15 mLs into feeding tube daily. 08/28/19   Donita Brooks, NP  nutrition supplement, JUVEN, (JUVEN) PACK Place 1 packet into feeding tube 2 (two) times daily between meals. 08/27/19   Donita Brooks, NP  Nutritional Supplements (FEEDING SUPPLEMENT, VITAL AF 1.2 CAL,) LIQD Place 1,000 mLs into feeding tube continuous. 08/27/19   Donita Brooks, NP  ondansetron (ZOFRAN) 4 MG/2ML SOLN injection Inject 2 mLs (4 mg total) into the vein every 6 (six) hours as needed for nausea. Patient not taking: Reported on 12/06/2019 08/27/19   Donita Brooks, NP   oxyCODONE (ROXICODONE) 5 MG/5ML solution Place 5 mLs (5 mg total) into feeding tube every 8 (eight) hours. Patient not taking: Reported on 12/06/2019 08/27/19   Donita Brooks, NP  pantoprazole (PROTONIX) 40 MG tablet 1 tab po daily per g tube. 12/06/19   Saguier, Percell Miller, PA-C  sertraline (ZOLOFT) 50 MG tablet Place 1 tablet (50 mg total) into feeding tube  daily. 08/28/19   Noe Gens L, NP  sodium chloride 0.9 % infusion Inject 250 mLs into the vein continuous. 08/27/19   Donita Brooks, NP  sulfamethoxazole-trimethoprim (BACTRIM) 200-40 MG/5ML suspension Place 20 mLs into feeding tube daily. Patient not taking: Reported on 12/06/2019 08/28/19   Donita Brooks, NP     Critical care time: 40 minutes     [name], MD [place] [facility] Pager: [phone] Cell: [phone] If no response, call [phone]

## 2020-03-23 NOTE — ED Notes (Signed)
Pt able to nod appropriately, denies acute distress.

## 2020-03-23 NOTE — Progress Notes (Signed)
RT was called by the nurse and said a pt's trach was out and asked if I could come look at the Pt. RT looked at the trach and it was deviated to the right and looked like it would be easy too insert. RT set up a ambu bag and CO2 detector, lube. RT inserted the trach without any issues and the Pt said he was able to breath without any issues and the CO2 turn yellow. The Pt's SATS increased and he looked a lot more comfortable.

## 2020-03-23 NOTE — Procedures (Signed)
Chest Tube Insertion Procedure Note  Indications:  Clinically significant Pneumothorax  Pre-operative Diagnosis: Pneumothorax  Post-operative Diagnosis: Pneumothorax  Procedure Details  Informed consent was obtained for the procedure, including sedation.  Risks of lung perforation, hemorrhage, arrhythmia, and adverse drug reaction were discussed.   After sterile skin prep, using standard technique, a 14 French tube was placed in the right mid clavicular line 2nd rib space.  Findings: None  Estimated Blood Loss:  Minimal         Specimens:  None              Complications:  None; patient tolerated the procedure well.         Disposition: ICU - intubated and critically ill.         Condition: stable  Attending Attestation: I performed the procedure.

## 2020-03-23 NOTE — ED Provider Notes (Signed)
[facility] DEPT Provider Note   CSN: [id number] Arrival date & time: 03/23/20  0915     History Chief Complaint  Patient presents with  . Cough  . Tracheostomy Tube Change    Steve [name] is a 64 y.o. male.  HPI   64 year old male with past medical history of Hodgkin's lymphoma, Covid infection earlier in the year with Covid pneumonia, HCAP with subsequent respiratory failure requiring trach placement, DM, HTN presents the emergency department with dislodged trach.  History is limited secondary to acuity and difficulty understanding the patient. Patient has trach and ventilator at home, baseline is 6-8L.   Patient states that he has been coughing with phlegm for two days and when he was suctioning the trach it became dislodged. It is reported when EMS arrived he was hypoxic into the 70s.  They attempted to replace the trach, it was only partially replaced and patient was brought here. It is reported that for the past 2 days patient has had increased cough and secretions.  He admits that he has been having chest pain, cough.  Denies any fever, GI symptoms or swelling of his lower extremities.    Past Medical History:  Diagnosis Date  . Abscess of muscle 08/10/2011   staph infection of right hip   . Acute on chronic respiratory failure with hypoxia ([place])   . Acute respiratory distress syndrome (ARDS) due to COVID-19 virus ([place])   . COVID-19 virus infection   . Diabetes mellitus without complication ([place])   . GERD (gastroesophageal reflux disease)    rare reflux - no meds for reflux - NO PROBLEM IN PAST SEVERAL YRS  . HCAP (healthcare-associated pneumonia)   . Healthcare-associated pneumonia   . Hip dysplasia, congenital    no surgery as a child for hip dysplasia - has had bilateral hip replacements as an adult  . Hodgkin lymphoma ([place])   . Hodgkin's lymphoma ([place])   . Hypertension   . Pancreatitis   . Pneumothorax, acute   . Postoperative  anemia due to acute blood loss 09/07/2012  . Septic arthritis of hip ([place]) 09/05/2012   PT'S TOTAL HIP JOINT REMOVED - ANTIBIOTIC SPACE PLACED AND PT HAS FINISHED IV ANTIBIOTICS ( PICC LINE REMOVED)  . Sleep apnea    USES CPAP    Patient Active Problem List   Diagnosis Date Noted  . Chronic respiratory failure with hypoxia ([place])   . Acute respiratory distress syndrome (ARDS) due to COVID-19 virus ([place])   . Hodgkin's lymphoma ([place])   . Pneumothorax, acute   . Healthcare-associated pneumonia   . COVID-19 virus infection   . On mechanically assisted ventilation ([place])   . Dysphagia   . Pneumothorax   . Subcutaneous air ([place])   . Status post tracheostomy ([place])   . Pneumomediastinum ([place])   . Acute respiratory distress syndrome (ARDS) due to COVID-19 virus ([place])   . Pressure injury of skin 07/27/2019  . Acute respiratory failure with hypoxemia ([place])   . 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 Malnutrition of moderate degree 07/10/2019  . Acute on chronic respiratory failure ([place]) 07/10/2019  . Diabetes mellitus ([place]) 07/10/2019  . HCAP (healthcare-associated pneumonia)   . Hypoxia   . Febrile neutropenia ([place]) 05/17/2019  . COVID-19 05/17/2019  . Anemia 05/17/2019  . Hyponatremia 05/17/2019  . Protein-calorie malnutrition, severe ([place]) 05/17/2019  . Neutropenic fever ([place]) 05/16/2019  . 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 Chemotherapy-induced diarrhea 04/22/2019  .  Anemia due to antineoplastic chemotherapy 03/25/2019  . Hodgkin's lymphoma ([place]) 02/25/2019  . Abnormal CT scan, pancreas - tail area 01/31/2013  . 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 Septic arthritis of hip ([place]) 09/05/2012  . Tick bite of abdomen 11/09/2011  . Routine general medical examination at a health care facility 09/12/2011  . Elbow pain 09/12/2011  . S/P PICC central line placement 08/10/2011  . Abscess of muscle 08/10/2011  . H/O dental abscess  08/10/2011  . Staphylococcus aureus bacteremia 07/27/2011  . Elevated BP 09/07/2010  . Abdominal pain, RLQ 09/02/2010  . HYPOGONADISM, MALE 03/23/2007  . IMPAIRED GLUCOSE TOLERANCE 03/23/2007  . SLEEP APNEA 03/23/2007    Past Surgical History:  Procedure Laterality Date  . COLONOSCOPY  04/26/2007  . HERNIA REPAIR     inguinal hernia x3  . IR GASTROSTOMY TUBE MOD SED  08/22/2019  . IR IMAGING GUIDED PORT INSERTION  03/29/2019  . JOINT REPLACEMENT  2002 & 2007   bilateral hip replacement  . LYMPH NODE BIOPSY Right 03/20/2019   Procedure: DEEP RIGHT SUPRACLAVICULAR LYMPH NODE EXCISION;  Surgeon: Fanny Skates, MD;  Location: [facility];  Service: General;  Laterality: Right;  . MULTIPLE EXTRACTIONS WITH ALVEOLOPLASTY  07/28/2011   Procedure: MULTIPLE EXTRACION WITH ALVEOLOPLASTY;  Surgeon: Lenn Cal, DDS;  Location: [facility];  Service: Oral Surgery;  Laterality: N/A;  Extraction of tooth #'s 2,3,4,5,6,11,12,13,15,19,22 with alveoloplasty.  . shoulder repair - right for separation of shoulder    . TEE WITHOUT CARDIOVERSION  07/29/2011   Procedure: TRANSESOPHAGEAL ECHOCARDIOGRAM (TEE);  Surgeon: Josue Hector, MD;  Location: [facility];  Service: Cardiovascular;  Laterality: N/A;  . TOTAL HIP REVISION Right 09/05/2012   Procedure: RIGHT HIP RESECTION ARTHROPLASTY WITH ANTIBIOTIC SPACERS;  Surgeon: [name], MD;  Location: [facility];  Service: Orthopedics;  Laterality: Right;  . TOTAL HIP REVISION Right 11/30/2012   Procedure: RIGHT TOTAL HIP ARTHROPLASTY REIMPLANTATION;  Surgeon: [name], MD;  Location: [facility];  Service: Orthopedics;  Laterality: Right;       Family History  Problem Relation Age of Onset  . Melanoma Mother   . Heart attack Mother   . Hyperlipidemia Neg Hx   . Sudden death Neg Hx   . Hypertension Neg Hx   . Diabetes Neg Hx     Social History   Tobacco Use  . Smoking status: Never Smoker  . Smokeless tobacco: Never Used  Vaping  Use  . Vaping Use: Never used  Substance Use Topics  . Alcohol use: Yes    Comment: rare beer. 5 times a year at most. 2 beers when he drinks.  . Drug use: No    Home Medications Prior to Admission medications   Medication Sig Start Date End Date Taking? Authorizing Provider  acetaminophen (TYLENOL) 160 MG/5ML solution Place 20.3 mLs (650 mg total) into feeding tube every 4 (four) hours as needed for mild pain or fever (temp > 101.5). Patient not taking: Reported on 12/06/2019 08/27/19   Donita Brooks, NP  Amino Acids-Protein Hydrolys (FEEDING SUPPLEMENT, PRO-STAT SUGAR FREE 64,) LIQD Place 30 mLs into feeding tube 2 (two) times daily. 08/27/19   Donita Brooks, NP  atorvastatin (LIPITOR) 10 MG tablet TAKE 1 TABLET BY MOUTH EVERY DAY Patient not taking: Reported on 12/06/2019 10/07/19   Saguier, Percell Miller, PA-C  Chlorhexidine Gluconate Cloth 2 % PADS Apply 6 each topically daily. Patient not taking: Reported on 12/06/2019 08/28/19   Noe Gens  L, NP  chlorhexidine gluconate, MEDLINE KIT, (PERIDEX) 0.12 % solution 15 mLs by Mouth Rinse route 2 (two) times daily. Patient not taking: Reported on 12/06/2019 08/27/19   Donita Brooks, NP  docusate (COLACE) 50 MG/5ML liquid Place 10 mLs (100 mg total) into feeding tube daily as needed for mild constipation. Patient not taking: Reported on 12/06/2019 08/27/19   Donita Brooks, NP  enoxaparin (LOVENOX) 40 MG/0.4ML injection Inject 0.4 mLs (40 mg total) into the skin daily. Patient not taking: Reported on 12/06/2019 08/27/19   Donita Brooks, NP  ferrous sulfate 300 (60 Fe) MG/5ML syrup Place 5 mLs (300 mg total) into feeding tube daily with breakfast. Patient not taking: Reported on 12/06/2019 08/28/19   Donita Brooks, NP  fludrocortisone (FLORINEF) 0.1 MG tablet Place 1 tablet (0.1 mg total) into feeding tube daily. Patient not taking: Reported on 12/06/2019 08/28/19   Donita Brooks, NP  Hydrocortisone (GERHARDT'S BUTT CREAM) CREA Apply 1 application  topically as needed for irritation. 08/27/19   Donita Brooks, NP  insulin aspart (NOVOLOG) 100 UNIT/ML injection Inject 3-9 Units into the skin every 4 (four) hours. Patient not taking: Reported on 12/06/2019 08/27/19   Donita Brooks, NP  insulin glargine (LANTUS) 100 UNIT/ML injection Inject 0.06 mLs (6 Units total) into the skin daily. Patient not taking: Reported on 12/06/2019 08/28/19   Donita Brooks, NP  ipratropium-albuterol (DUONEB) 0.5-2.5 (3) MG/3ML SOLN Take 3 mLs by nebulization every 4 (four) hours as needed. Patient not taking: Reported on 12/06/2019 08/27/19   Donita Brooks, NP  Lactobacillus-Inulin (PROBIOTIC DIGESTIVE SUPPORT) CAPS Take by mouth. Digestive Enzymes    [provider]  lidocaine-prilocaine (EMLA) cream Apply to affected area once Patient not taking: Reported on 12/06/2019 03/25/19   Tish Men, MD  lip balm (CARMEX) ointment Apply topically as needed for lip care. Patient not taking: Reported on 12/06/2019 08/27/19   Donita Brooks, NP  loperamide HCl (IMODIUM) 1 MG/7.5ML suspension Place 15 mLs (2 mg total) into feeding tube every 8 (eight) hours as needed for diarrhea or loose stools. Patient not taking: Reported on 12/06/2019 08/27/19   Donita Brooks, NP  LORazepam (ATIVAN) 2 MG/ML injection Inject 0.5-1 mLs (1-2 mg total) into the vein every 4 (four) hours as needed for anxiety. 08/27/19   Donita Brooks, NP  midodrine (PROAMATINE) 5 MG tablet Place 1 tablet (5 mg total) into feeding tube with breakfast, with lunch, and with evening meal. Patient not taking: Reported on 12/06/2019 08/27/19   Donita Brooks, NP  Morphine Sulfate (MORPHINE CONCENTRATE) 10 mg / 0.5 ml concentrated solution As directed 11/10/19   [provider]  Multiple Vitamin (MULTIVITAMIN) LIQD Place 15 mLs into feeding tube daily. 08/28/19   Donita Brooks, NP  nutrition supplement, JUVEN, (JUVEN) PACK Place 1 packet into feeding tube 2 (two) times daily between meals. 08/27/19    Donita Brooks, NP  Nutritional Supplements (FEEDING SUPPLEMENT, VITAL AF 1.2 CAL,) LIQD Place 1,000 mLs into feeding tube continuous. 08/27/19   Donita Brooks, NP  ondansetron (ZOFRAN) 4 MG/2ML SOLN injection Inject 2 mLs (4 mg total) into the vein every 6 (six) hours as needed for nausea. Patient not taking: Reported on 12/06/2019 08/27/19   Donita Brooks, NP  oxyCODONE (ROXICODONE) 5 MG/5ML solution Place 5 mLs (5 mg total) into feeding tube every 8 (eight) hours. Patient not taking: Reported on 12/06/2019 08/27/19   Donita Brooks, NP  pantoprazole (PROTONIX) 40 MG tablet 1 tab po daily per g tube. 12/06/19   Saguier, Percell Miller, PA-C  sertraline (ZOLOFT) 50 MG tablet Place 1 tablet (50 mg total) into feeding tube daily. 08/28/19   Noe Gens L, NP  sodium chloride 0.9 % infusion Inject 250 mLs into the vein continuous. 08/27/19   Donita Brooks, NP  sulfamethoxazole-trimethoprim (BACTRIM) 200-40 MG/5ML suspension Place 20 mLs into feeding tube daily. Patient not taking: Reported on 12/06/2019 08/28/19   Donita Brooks, NP    Allergies    Patient has no known allergies.  Review of Systems   Review of Systems  Reason unable to perform ROS: limited 2/2 acuity and difficulty understanding the patient.  Constitutional: Negative for fever.  HENT: Positive for congestion.   Respiratory: Positive for cough and shortness of breath.   Cardiovascular: Positive for chest pain. Negative for leg swelling.  Gastrointestinal: Negative for abdominal pain, diarrhea and vomiting.  Musculoskeletal: Negative for myalgias.  Neurological: Negative for headaches.    Physical Exam Updated Vital Signs BP 107/74   Pulse (!) 115   Temp 97.9 F (36.6 C) (Oral)   Resp (!) 28   SpO2 99%   Physical Exam Vitals and nursing note reviewed.  Constitutional:      Comments: Thin and chronically ill appearing  HENT:     Head: Normocephalic.     Mouth/Throat:     Mouth: Mucous membranes are dry.  Neck:      Comments: Trach tube halfway inserted with vent attached Cardiovascular:     Rate and Rhythm: Tachycardia present.  Pulmonary:     Comments: Tachypneic, b/l rales, auditory upper airway secretions Abdominal:     Palpations: Abdomen is soft.     Tenderness: There is no abdominal tenderness.  Musculoskeletal:     Right lower leg: No edema.     Left lower leg: No edema.  Skin:    General: Skin is warm.  Neurological:     Mental Status: He is alert and oriented to person, place, and time. Mental status is at baseline.     ED Results / Procedures / Treatments   Labs (all labs ordered are listed, but only abnormal results are displayed) Labs Reviewed  CULTURE, BLOOD (SINGLE)  LACTIC ACID, PLASMA  LACTIC ACID, PLASMA  COMPREHENSIVE METABOLIC PANEL  CBC WITH DIFFERENTIAL/PLATELET  PROTIME-INR  APTT  URINALYSIS, ROUTINE W REFLEX MICROSCOPIC  TROPONIN I (HIGH SENSITIVITY)    EKG None  Radiology No results found.  Procedures Procedures (including critical care time)  Medications Ordered in ED Medications  lactated ringers bolus 1,000 mL (has no administration in time range)    ED Course  I have reviewed the triage vital signs and the nursing notes.  Pertinent labs & imaging results that were available during my care of the patient were reviewed by me and considered in my medical decision making (see chart for details).  Clinical Course as of 03/23/20 1128  Mon Mar 23, 2020  1102 Radiology called, patient has evidence of a right pneumothorax with possible forming tension.  Also of note there is concern that the tracheal tube balloon diameter exceeds that of the tracheal shadow. [KH]    Clinical Course User Index [KH] Yanelis Osika, Alvin Critchley, DO   MDM Rules/Calculators/A&P                          64 year old male presents the emergency department with dislodged trach.  It  was partially inserted by EMS, he was reportedly hypoxic to the 70s prior to arrival.  Main complaint  is productive cough and chest pain for the last 2 days.  On arrival patient was maintaining oxygen saturations 92 or greater, tachycardic and tachypneic.  Respiratory therapy called bedside.  We were able to fully insert the trach and inflate the cuff.  Following this the patient's breathing significantly improved.  We used a BVM and an end-tidal which had good color change to yellow, he has bilateral air entry and oxygenation increased to 100%.  Patient feels improved.  X-ray shows a right pneumothorax with possible developing tension.  There is also concerned that the tracheal balloon has exceeded the diameter of the trachea with potential tracheal injury.  Cardiothoracic surgery is aware the patient.  Consulted critical care who came down to see the patient at bedside.  Patient is laying in bed, awake, comfortable.  He continues to be tachycardic and tachypneic.  He had a hypotensive reading but on my bedside evaluation was normotensive with fluid running, normal oxygenation.  Plan at this time is to admit to ICU and sedate for chest tube insertion it unit.  Patient stable on transfer from ED.  Final Clinical Impression(s) / ED Diagnoses Final diagnoses:  None    Rx / DC Orders ED Discharge Orders    None       Lorelle Gibbs, DO 03/23/20 1203

## 2020-03-23 NOTE — Procedures (Signed)
Tracheostomy Exchange Procedure Note  Steve [name]  [id number]  03/09/1956  Date:03/23/20  Time:1:44 PM   Provider Performing:Brent Odai Wimmer   Procedure: Tracheostomy Exchange Through Immature Stoma [phone])  Indication(s) Old tracheostomy tube dislodged  Consent Risks of the procedure as well as the alternatives and risks of each were explained to the patient and/or caregiver.  Consent for the procedure was obtained and is signed in the bedside chart  Anesthesia None   Time Out Verified patient identification, verified procedure, site/side was marked, verified correct patient position, special equipment/implants available, medications/allergies/relevant history reviewed, required imaging and test results available.   Sterile Technique Hand hygiene, gloves   Procedure Description Size #6 distal XLT cuffed existing Shiley removed and size 6 distal XLT cuffed Shiley placed through stoma using a bronchoscope to place under emergent conditions in the ICU.  Moderate sedation used, see bronchoscopy note from today.  Complications/Tolerance None; patient tolerated the procedure well..   EBL none  Steve Awkward, MD [facility] Pager: [phone] Cell: [phone] If no response, call [phone]

## 2020-03-24 ENCOUNTER — Inpatient Hospital Stay (HOSPITAL_COMMUNITY): Payer: BC Managed Care – PPO

## 2020-03-24 ENCOUNTER — Encounter (HOSPITAL_COMMUNITY): Payer: Self-pay | Admitting: Pulmonary Disease

## 2020-03-24 LAB — CBC
HCT: 29.9 % — ABNORMAL LOW (ref 39.0–52.0)
Hemoglobin: 9.3 g/dL — ABNORMAL LOW (ref 13.0–17.0)
MCH: 29.2 pg (ref 26.0–34.0)
MCHC: 31.1 g/dL (ref 30.0–36.0)
MCV: 93.7 fL (ref 80.0–100.0)
Platelets: 211 10*3/uL (ref 150–400)
RBC: 3.19 MIL/uL — ABNORMAL LOW (ref 4.22–5.81)
RDW: 13.4 % (ref 11.5–15.5)
WBC: 10.5 10*3/uL (ref 4.0–10.5)
nRBC: 0 % (ref 0.0–0.2)

## 2020-03-24 LAB — BASIC METABOLIC PANEL
Anion gap: 8 (ref 5–15)
BUN: 17 mg/dL (ref 8–23)
CO2: 32 mmol/L (ref 22–32)
Calcium: 8.9 mg/dL (ref 8.9–10.3)
Chloride: 97 mmol/L — ABNORMAL LOW (ref 98–111)
Creatinine, Ser: 0.52 mg/dL — ABNORMAL LOW (ref 0.61–1.24)
GFR, Estimated: >60 mL/min (ref >60)
Glucose, Bld: 163 mg/dL — ABNORMAL HIGH (ref 70–99)
Potassium: 3.8 mmol/L (ref 3.5–5.1)
Sodium: 137 mmol/L (ref 135–145)

## 2020-03-24 LAB — GLUCOSE, CAPILLARY
Glucose-Capillary: 105 mg/dL — ABNORMAL HIGH (ref 70–99)
Glucose-Capillary: 115 mg/dL — ABNORMAL HIGH (ref 70–99)
Glucose-Capillary: 125 mg/dL — ABNORMAL HIGH (ref 70–99)
Glucose-Capillary: 140 mg/dL — ABNORMAL HIGH (ref 70–99)
Glucose-Capillary: 157 mg/dL — ABNORMAL HIGH (ref 70–99)

## 2020-03-24 LAB — PHOSPHORUS: Phosphorus: 3.6 mg/dL (ref 2.5–4.6)

## 2020-03-24 LAB — MAGNESIUM: Magnesium: 1.9 mg/dL (ref 1.7–2.4)

## 2020-03-24 MED ORDER — FREE WATER
100.0000 mL | Status: DC
  Administered 2020-03-24 – 2020-04-17 (×144): 100 mL

## 2020-03-24 MED ORDER — PROSOURCE TF PO LIQD
45.0000 mL | Freq: Two times a day (BID) | ORAL | Status: DC
  Administered 2020-03-24 – 2020-03-30 (×13): 45 mL
  Filled 2020-03-24 (×14): qty 45

## 2020-03-24 MED ORDER — OSMOLITE 1.2 CAL PO LIQD
1000.0000 mL | ORAL | Status: DC
  Administered 2020-03-24 – 2020-03-31 (×7): 1000 mL

## 2020-03-24 MED ORDER — SODIUM CHLORIDE 0.9% FLUSH
10.0000 mL | Freq: Three times a day (TID) | INTRAVENOUS | Status: DC
  Administered 2020-03-24 – 2020-03-26 (×7): 10 mL

## 2020-03-24 MED ORDER — LORAZEPAM 0.5 MG PO TABS
0.5000 mg | ORAL_TABLET | Freq: Once | ORAL | Status: AC
  Administered 2020-03-24: 22:00:00 0.5 mg
  Filled 2020-03-24: qty 1

## 2020-03-24 MED ORDER — JUVEN PO PACK
1.0000 | PACK | Freq: Two times a day (BID) | ORAL | Status: DC
  Administered 2020-03-24: 20:00:00 1 via ORAL
  Filled 2020-03-24 (×2): qty 1

## 2020-03-24 NOTE — Progress Notes (Signed)
eLink Physician-Brief Progress Note Patient Name: Steve [name] DOB: 04-Dec-1955 MRN: [id number]   Date of Service  03/24/2020  HPI/Events of Note  Anxiety - Patient on Ativan 0.5 mg per tube PRN at home.   eICU Interventions  Plan: 1. Ativan 0.5 mg per tube X 1 now.      Intervention Category Major Interventions: Delirium, psychosis, severe agitation - evaluation and management  Juanita Devincent Eugene 03/24/2020, 9:24 PM

## 2020-03-24 NOTE — TOC Initial Note (Signed)
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 PLAN: return to home on home vent with wife Following for progression, VENTILATOR @ 50% fi02, trach changed out on 121321, iv rocephin and iv precedex, iv levophed, had bronch on [id number] and insertion of chest tube on 121321  Expected Discharge Plan: [place] Barriers to Discharge: No Barriers Identified   Patient Goals and CMS Choice Patient states their goals for this hospitalization and ongoing recovery are:: unable to state CMS Medicare.gov Compare Post Acute Care list provided to:: Patient Choice offered to / list presented to : Patient  Expected Discharge Plan and Services Expected Discharge Plan: [place]   Discharge Planning Services: CM Consult   Living arrangements for the past 2 months: Mobile Home                                      Prior Living Arrangements/Services Living arrangements for the past 2 months: Mobile Home Lives with:: Spouse          Need for Family Participation in Patient Care: Yes (Comment) Care giver support system in place?: Yes (comment)   Criminal Activity/Legal Involvement Pertinent to Current Situation/Hospitalization: No - Comment as needed  Activities of Daily Living Home Assistive Devices/Equipment: Nebulizer,Oxygen,Vent/Trach supplies (peg tube, trach) ADL Screening (condition at time of admission) Patient's cognitive ability adequate  to safely complete daily activities?: Yes Is the patient deaf or have difficulty hearing?: No Does the patient have difficulty seeing, even when wearing glasses/contacts?: No Does the patient have difficulty concentrating, remembering, or making decisions?: No Patient able to express need for assistance with ADLs?: Yes Does the patient have difficulty dressing or bathing?: Yes Independently performs ADLs?: No Communication: Independent Dressing (OT): Needs assistance Is this a change from baseline?: Pre-admission baseline Grooming: Needs assistance Is this a change from baseline?: Pre-admission baseline Feeding: Needs assistance Is this a change from baseline?: Pre-admission baseline Bathing: Needs assistance Is this a change from baseline?: Pre-admission baseline Toileting: Dependent Is this a change from baseline?: Pre-admission baseline In/Out Bed: Dependent Is this a change from baseline?: Pre-admission baseline Walks in Home: Dependent Is this a change from baseline?: Pre-admission baseline Does the patient have difficulty walking or climbing stairs?: Yes (secondary to weakness) Weakness of Legs: Both Weakness of Arms/Hands: Both  Permission Sought/Granted                  Emotional Assessment Appearance:: Appears stated age Attitude/Demeanor/Rapport: Unable to Assess Affect (typically observed): Unable to Assess Orientation: : Fluctuating Orientation (Suspected and/or reported Sundowners) Alcohol / Substance Use: Not Applicable Psych Involvement: No (comment)  Admission diagnosis:  Pneumothorax on right [J93.9] Patient Active Problem List   Diagnosis Date Noted  . Pneumothorax on right 03/23/2020  . Chronic respiratory failure with hypoxia ([place])   .  Acute respiratory distress syndrome (ARDS) due to COVID-19 virus ([place])   . Hodgkin's lymphoma ([place])   . Pneumothorax, acute   . Healthcare-associated pneumonia   . COVID-19 virus infection   . On mechanically  assisted ventilation ([place])   . Dysphagia   . Pneumothorax   . Subcutaneous air ([place])   . Status post tracheostomy ([place])   . Pneumomediastinum ([place])   . Acute respiratory distress syndrome (ARDS) due to COVID-19 virus ([place])   . Pressure injury of skin 07/27/2019  . Acute respiratory failure with hypoxemia ([place])   . 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 Malnutrition of moderate degree 07/10/2019  . Acute on chronic respiratory failure ([place]) 07/10/2019  . Diabetes mellitus ([place]) 07/10/2019  . HCAP (healthcare-associated pneumonia)   . Hypoxia   . Febrile neutropenia ([place]) 05/17/2019  . COVID-19 05/17/2019  . Anemia 05/17/2019  . Hyponatremia 05/17/2019  . Protein-calorie malnutrition, severe ([place]) 05/17/2019  . Neutropenic fever ([place]) 05/16/2019  . 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 Chemotherapy-induced diarrhea 04/22/2019  . Anemia due to antineoplastic chemotherapy 03/25/2019  . Hodgkin's lymphoma ([place]) 02/25/2019  . Abnormal CT scan, pancreas - tail area 01/31/2013  . 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 Septic arthritis of hip ([place]) 09/05/2012  . Tick bite of abdomen 11/09/2011  . Routine general medical examination at a health care facility 09/12/2011  . Elbow pain 09/12/2011  . S/P PICC central line placement 08/10/2011  . Abscess of muscle 08/10/2011  . H/O dental abscess 08/10/2011  . Staphylococcus aureus bacteremia 07/27/2011  . Elevated BP 09/07/2010  . Abdominal pain, RLQ 09/02/2010  . HYPOGONADISM, MALE 03/23/2007  . IMPAIRED GLUCOSE TOLERANCE 03/23/2007  . SLEEP APNEA 03/23/2007   PCP:  [name], PA-C Pharmacy:   [id number] - [place], [address] [address] Phone: [phone] Fax: [phone]     Social Determinants of Health (SDOH) Interventions    Readmission Risk Interventions Readmission Risk Prevention Plan 07/22/2019   Transportation Screening Complete  Medication Review (RN Care Manager) Complete  Some recent data might be hidden

## 2020-03-24 NOTE — Progress Notes (Signed)
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  Past Medical History  Sleep apnea, on CPAP at home Post Covid pneumonia pulmonary fibrosis, on mechanical ventilation Chronic respiratory failure with hypoxemia on mechanical ventilation at home Hodgkin's lymphoma Hypertension History of pancreatitis Gastroesophageal reflux disease Diabetes mellitus type 2  Significant Hospital Events   December 13 admission  Consults:    Procedures:  Tracheostomy present prior to admission 9/13 right chest tube >   Significant Diagnostic Tests:  September 13 chest x-ray showed right-sided pneumothorax, chronic fibrotic changes, tracheostomy in place Bronchoscopy 12/13> no clear tracheal injury  Micro Data:  December 13 respiratory culture > gram negative rods December 13 SARS-CoV-2/influenza negative  Antimicrobials:  December 13 ceftriaxone   Interim history/subjective:   Making urine Hgb down some afebrile Quiet night Feels OK Would like to get out of bed  Objective   Blood pressure (!) 93/59, pulse 83, temperature 98.7 F (37.1 C), temperature source Oral, resp. rate (!) 23, height 5\' 5"  (1.651 m), weight 60.2 kg, SpO2 97 %.    Vent Mode: PCV FiO2 (%):  [40 %-50 %] 40 % Set Rate:  [24 bmp-28 bmp] 24 bmp PEEP:  [5 cmH20-8 cmH20] 5 cmH20 Plateau Pressure:  [14 cmH20-27 cmH20] 25 cmH20   Intake/Output Summary (Last 24 hours) at 03/24/2020 0844 Last data filed at 03/24/2020 0818 Gross per 24 hour  Intake 1193.51 ml  Output 1350 ml  Net -156.49 ml    Filed Weights   03/23/20 1235 03/24/20 0423  Weight: 60.2 kg 60.2 kg    Examination:  General:  In bed on vent HENT: NCAT tracheostomy in place PULM: CTA B, vent supported breathing CV: RRR, no mgr GI: BS+, soft, nontender MSK: normal bulk and tone Neuro: awake, following commands, speaking around tracheostomy  12/14 cxr personally reviewed> R chest tube in place, pocket of air in neck/superior mediastinum noted of undetermined significance   Resolved Hospital Problem list     Assessment & Plan:  Acute on chronic respiratory failure with hypoxemia due to right pneumothorax > improved post chest tube Question community-acquired pneumonia Chronic respiratory failure dependent on home ventilator Tracheostomy status Question of air in neck/superior mediastinum> unclear cause, bronchoscopy on 12/13 did not show clear tracheal injury but visualization somewhat poor CT chest today to evaluate the pocket of air in his mediastium/neck Continue mechanical ventilation per our ventilator today Full mechanical vent support VAP prevention Daily WUA/SBT CXR again in AM Change chest tube to water seal Make attempts to wean down vent this week to try to better understand his pulmonary prognosis  Community-acquired pneumonia: F/u respiratory culture Continue ceftriaxone 5 days  Tachycardia: resolved Tele  Anxiety Need for sedation for ventilator synchrony during procedure: resolved D/c sedation protocol  Physical deconditioning History of lymphoma, untreated PT/OT consult  Goals of care: he desires to walk, get up in a chair, etc. He desires full scope of support. He has not been able to get the support at home that he desires.  I believe he will be ventilator dependent for life (not  a good lung transplant candidate with history of lymphoma), but we need to make effort to wean the vent to better understand his lung prognosis.  DM2 SSI, q4h POC monitoring   Best practice  (evaluated daily)   Diet: npo Pain/Anxiety/Delirium protocol (if indicated): as above VAP protocol (if indicated): yes DVT prophylaxis: lovenox GI prophylaxis: Pantoprazole for stress ulcer prophylaxis Glucose control: as above Mobility: bed rest for now last date of multidisciplinary goals of care discussion 12/14 Family and staff present spouse, [name] Summary of discussion see above Follow up goals of care discussion due 12/21 Code Status: Full Disposition: admit to ICU  Labs   CBC: Recent Labs  Lab 03/23/20 1042 03/24/20 0623  WBC 15.6* 10.5  NEUTROABS 12.7*  --   HGB 11.0* 9.3*  HCT 34.7* 29.9*  MCV 93.0 93.7  PLT 219 161    Basic Metabolic Panel: Recent Labs  Lab 03/23/20 1042 03/24/20 0623  NA 136 137  K 4.7 3.8  CL 95* 97*  CO2 32 32  GLUCOSE 172* 163*  BUN 21 17  CREATININE 0.73 0.52*  CALCIUM 9.6 8.9  MG  --  1.9  PHOS  --  3.6   GFR: Estimated Creatinine Clearance: 79.4 mL/min (A) (by C-G formula based on SCr of 0.52 mg/dL (L)). Recent Labs  Lab 03/23/20 1042 03/24/20 0623  WBC 15.6* 10.5  LATICACIDVEN 1.5  --     Liver Function Tests: Recent Labs  Lab 03/23/20 1042  AST 30  ALT 41  ALKPHOS 71  BILITOT 0.4  PROT 8.4*  ALBUMIN 3.4*   No results for input(s): LIPASE, AMYLASE in the last 168 hours. No results for input(s): AMMONIA in the last 168 hours.  ABG    Component Value Date/Time   PHART 7.388 08/29/2019 0538   PCO2ART 70.3 (HH) 08/29/2019 0538   PO2ART 68.4 (L) 08/29/2019 0538   HCO3 52.1 (H) 09/18/2019 1025   TCO2 28 07/08/2019 1331   O2SAT 67.3 09/18/2019 1025     Coagulation Profile: Recent Labs  Lab 03/23/20 1042  INR 1.2    Cardiac Enzymes: No results for input(s): CKTOTAL, CKMB, CKMBINDEX, TROPONINI in the last 168 hours.  HbA1C: Hgb A1c MFr Bld  Date/Time Value Ref Range Status  03/23/2020 12:39 PM 5.7 (H) 4.8 - 5.6 % Final    Comment:    (NOTE) Pre diabetes:          5.7%-6.4%  Diabetes:               >6.4%  Glycemic control for   <7.0% adults with diabetes   07/09/2019 05:10 AM 7.3 (H) 4.8 - 5.6 % Final    Comment:    (NOTE) Pre diabetes:          5.7%-6.4% Diabetes:              >6.4% Glycemic control for   <7.0% adults with diabetes     CBG: Recent Labs  Lab 03/23/20 1651 03/23/20 2145 03/24/20 0746  GLUCAP 103* 117* 140*    Critical care time: 35 minutes     [name], MD [place] [facility] Pager: [phone] Cell: [phone] If no response, call [phone]

## 2020-03-24 NOTE — Progress Notes (Signed)
Initial Nutrition Assessment  DOCUMENTATION CODES:   Non-severe (moderate) malnutrition in context of chronic illness  INTERVENTION:  - will adjust TF regimen: Osmolite 1.2 @ 60 ml/hr with 45 ml Prosource TF BID, 1 packet Juven BID, and 100 ml free water every 4 hours.  - this regimen will provide 1998 kcal, 107 grams protein, and 1781 ml free water.    NUTRITION DIAGNOSIS:   Moderate Malnutrition related to chronic illness (lymphoma) as evidenced by moderate fat depletion,moderate muscle depletion,severe muscle depletion.  GOAL:   Patient will meet greater than or equal to 90% of their needs  MONITOR:   TF tolerance,Labs,Weight trends,Skin  REASON FOR ASSESSMENT:   Ventilator,Consult Enteral/tube feeding initiation and management  ASSESSMENT:   64 year-old male with medical history of post-COVID fibrosis on home ventilator, COVID-19 infection, congenital hip dysplasia, GERD, sleep apnea, DM, HTN, and Hodgkin's lymphoma. Patient presented to the ED due to 2-day hx of chest tightness and increased cough. He was found to have malpositioned trach and new R-sided pneumothorax.  Able to talk with RN concerning patient and TF.   Vent not taken in to account in estimating nutrition needs as patient is on a vent at home.   Patient laying in bed with no family or visitors at bedside. Patient on vent via trach but is able to mouth words to communicate with RD.   He does not consume anything PO at home. He receives Osmolite via PEG but is unsure if it is 1.2 or 1.5; he receives it continuously over 24 hours/day but is unsure of rate.   Patient denies any pain around PEG site or leaking from/around tube.   He is bed bound at baseline and wears hospital gowns at home. He lives with his two daughters.   Weight today is 133 lb and PTA the most recently documented weight was on 8/27 when he weighed 130 lb. Weight on 08/27/19, during previous admission, was 146 lb. This indicates 13 lb  weight loss (9% body weight) in the past 7 months; not significant for time frame.  Patient reports that he used to weigh 210 lb and that he last weighed this in 04/2019. It appears that weight on 04/22/19 was 183 lb. This indicates 27 lb weight loss (14.7% body weight) in the past 11 months; not significant for time frame.   He is currently receiving Osmolite 1.2 @ 40 ml/hr which is providing 1152 kcal, 53 grams protein, and 787 ml free water.    Patient is currently intubated on ventilator support MV: 12.1 L/min Temp (24hrs), Avg:98.2 F (36.8 C), Min:97.7 F (36.5 C), Max:98.7 F (37.1 C)   Labs reviewed; CBG: 140 mg/dl, Cl: 97 mmol/l, creatinine: 0.52 mg/dl.  Medications reviewed; 20 mg IV pepcid BID, sliding scale novolog, 40 mg protonix per tube/day.    NUTRITION - FOCUSED PHYSICAL EXAM:  Flowsheet Row Most Recent Value  Orbital Region Moderate depletion  Upper Arm Region Moderate depletion  Thoracic and Lumbar Region Moderate depletion  Buccal Region Moderate depletion  Temple Region Moderate depletion  Clavicle Bone Region Moderate depletion  Clavicle and Acromion Bone Region Moderate depletion  Scapular Bone Region Moderate depletion  Dorsal Hand Moderate depletion  Patellar Region Severe depletion  Anterior Thigh Region Severe depletion  Posterior Calf Region Severe depletion  Edema (RD Assessment) None  Hair Reviewed  Eyes Reviewed  Mouth Reviewed  Skin Reviewed  Nails Reviewed       Diet Order:   Diet Order  Diet NPO time specified  Diet effective now                 EDUCATION NEEDS:   Education needs have been addressed  Skin:  Skin Assessment: Skin Integrity Issues: Skin Integrity Issues:: Stage I,Unstageable Stage I: bilateral heel Unstageable: full thickness to mid coccyx  Last BM:  12/11  Height:   Ht Readings from Last 1 Encounters:  03/23/20 5\' 5"  (1.651 m)    Weight:   Wt Readings from Last 1 Encounters:  03/24/20  60.2 kg    Estimated Nutritional Needs:  Kcal:  1990-2175 kcal Protein:  105-120 grams Fluid:  >/= 2.1 L/day     Jarome Matin, MS, RD, LDN, CNSC Inpatient Clinical Dietitian RD pager # available in [place]  After hours/weekend pager # available in [facility]

## 2020-03-25 ENCOUNTER — Inpatient Hospital Stay (HOSPITAL_COMMUNITY): Payer: BC Managed Care – PPO

## 2020-03-25 LAB — COMPREHENSIVE METABOLIC PANEL
ALT: 25 U/L (ref 0–44)
AST: 18 U/L (ref 15–41)
Albumin: 2.8 g/dL — ABNORMAL LOW (ref 3.5–5.0)
Alkaline Phosphatase: 61 U/L (ref 38–126)
Anion gap: 7 (ref 5–15)
BUN: 17 mg/dL (ref 8–23)
CO2: 34 mmol/L — ABNORMAL HIGH (ref 22–32)
Calcium: 8.8 mg/dL — ABNORMAL LOW (ref 8.9–10.3)
Chloride: 98 mmol/L (ref 98–111)
Creatinine, Ser: 0.51 mg/dL — ABNORMAL LOW (ref 0.61–1.24)
GFR, Estimated: >60 mL/min (ref >60)
Glucose, Bld: 160 mg/dL — ABNORMAL HIGH (ref 70–99)
Potassium: 4.2 mmol/L (ref 3.5–5.1)
Sodium: 139 mmol/L (ref 135–145)
Total Bilirubin: 0.5 mg/dL (ref 0.3–1.2)
Total Protein: 7.3 g/dL (ref 6.5–8.1)

## 2020-03-25 LAB — GLUCOSE, CAPILLARY
Glucose-Capillary: 112 mg/dL — ABNORMAL HIGH (ref 70–99)
Glucose-Capillary: 123 mg/dL — ABNORMAL HIGH (ref 70–99)
Glucose-Capillary: 126 mg/dL — ABNORMAL HIGH (ref 70–99)
Glucose-Capillary: 126 mg/dL — ABNORMAL HIGH (ref 70–99)
Glucose-Capillary: 133 mg/dL — ABNORMAL HIGH (ref 70–99)

## 2020-03-25 MED ORDER — LORAZEPAM 0.5 MG PO TABS
0.5000 mg | ORAL_TABLET | ORAL | Status: DC | PRN
  Administered 2020-03-25 – 2020-04-17 (×74): 0.5 mg
  Filled 2020-03-25 (×75): qty 1

## 2020-03-25 MED ORDER — JUVEN PO PACK
1.0000 | PACK | Freq: Two times a day (BID) | ORAL | Status: DC
  Administered 2020-03-25 – 2020-03-31 (×13): 1
  Filled 2020-03-25 (×12): qty 1

## 2020-03-25 MED ORDER — CHLORHEXIDINE GLUCONATE 0.12 % MT SOLN
OROMUCOSAL | Status: AC
  Administered 2020-03-25: 09:00:00 15 mL
  Filled 2020-03-25: qty 15

## 2020-03-25 NOTE — Procedures (Signed)
Tracheostomy Exchange Procedure Note  Steve [name]  [id number]  [birth year]  Date:03/25/20  Time:4:46 PM   Provider Performing:Brent Shantai Tiedeman   Procedure: Tracheostomy Exchange Through Immature Stoma [phone])  Indication(s) Cuff leak Acute on chronic respiratory failure with hypercapnea  Consent Unable to obtain consent due to emergent nature of procedure. (cuff leak enlarging)  Anesthesia None   Time Out Verified patient identification, verified procedure, site/side was marked, verified correct patient position, special equipment/implants available, medications/allergies/relevant history reviewed, required imaging and test results available.   Sterile Technique Hand hygiene, gloves   Procedure Description Size 6 XLT cuffed existing Shiley removed (prior to this a bronchoscopy showed that it was 5.5 cm above the carina), then a Size 8 Shiley tube was placed.  After placing this tube the cuff leak was larger.  I performed a bronchoscopy at this point and the tracheostomy tube orifice was around 7cm above the carina.  Given the larger cuff leak (notable on ventilator and audible), we removed the Shiley #8 and placed a new #6 XLT distal tracheostomy tube.  The cuff leak was present with this tube in place, required 15cc of air to keep the cuff leak to a minimal.  Bronchoscopy was performed and the tracheostomy tube was noted to again be 5.5 cm above the carina.  In further discussion with the patient he notes that the current level of audible air leak is in keeping with what he has been experiencing for months now.    Complications/Tolerance None; patient tolerated the procedure well..   EBL Minimal  [name], MD [place] [facility] Pager: [phone] Cell: [phone] If no response, call [phone]

## 2020-03-25 NOTE — Progress Notes (Signed)
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  Past Medical History  Sleep apnea, on CPAP at home Post Covid pneumonia pulmonary fibrosis, on mechanical ventilation Chronic respiratory failure with hypoxemia on mechanical ventilation at home Hodgkin's lymphoma Hypertension History of pancreatitis Gastroesophageal reflux disease Diabetes mellitus type 2  Significant Hospital Events   December 13 admission  Consults:    Procedures:  Tracheostomy present prior to admission 9/13 right chest tube >   Significant Diagnostic Tests:  September 13 chest x-ray showed right-sided pneumothorax, chronic fibrotic changes, tracheostomy in place Bronchoscopy 12/13> no clear tracheal injury  Micro Data:  December 13 respiratory culture > gram negative rods December 13 SARS-CoV-2/influenza negative  Antimicrobials:  December 13 ceftriaxone   Interim history/subjective:   Cuff leak present on exam Speaking despite tracheostomy Wants to get out of bed  Objective   Blood pressure (!) 88/62, pulse 71, temperature 98.4 F (36.9 C), temperature source Oral, resp. rate (!) 23, height 5\' 5"  (1.651 m), weight 60.2 kg, SpO2 97 %.    Vent Mode: PCV FiO2 (%):  [40 %] 40 % Set Rate:  [24 bmp] 24 bmp PEEP:  [5 cmH20] 5 cmH20 Pressure Support:  [5 cmH20] 5 cmH20 Plateau Pressure:  [13 cmH20-20 cmH20] 13 cmH20   Intake/Output Summary (Last 24 hours) at 03/25/2020 1539 Last data filed at 03/25/2020 1442 Gross per 24 hour  Intake 1374.9 ml  Output 2284 ml  Net -909.1 ml    Filed Weights   03/23/20 1235 03/24/20 0423  Weight: 60.2 kg 60.2 kg    Examination:  General:  In bed on vent HENT: NCAT tracheostomy stoma without drainage PULM: CTA B, vent supported breathing CV: RRR, no mgr GI: BS+, soft, nontender MSK: normal bulk and tone Neuro: awake, following commands  12/15 cxr personally reviewed> tracheostomy tube in place, bilateral fibrotic   Resolved Hospital Problem list   Tachycardia: resolved  Assessment & Plan:  Acute on chronic respiratory failure with hypoxemia due to right pneumothorax > improved post chest tube Question community-acquired pneumonia Chronic respiratory failure dependent on home ventilator Tracheostomy status > malpositioned, based on exam and cuff leak I'm concerned that the tracheostomy has significant breakdown from not being changed; after adjusting his tracheostomy today there was no change in the cuff leak, confirmed appropriate position with bronchoscope. Discussed with ENT> plan insertion of a #8 shiley today as hopefully a larger girth tracheostomy tube will occlude the air leak Full mechanical vent support VAP prevention Daily WUA/SBT Chest tube: repeat CXR in AM, consider removing then Wean down vent support after addressing tracheostomy air leak issue  Community-acquired pneumonia: Ceftriaxone 5 days  Anxiety Need for sedation for ventilator synchrony during procedure: resolved D/C sedation protocol  Physical deconditioning History of lymphoma, untreated PT/OT consult  Goals of care: see note from 12/14  DM2 SSI, q4h POC monitoring  Best practice (evaluated daily)   Diet: npo Pain/Anxiety/Delirium protocol (if indicated): as above VAP protocol (if indicated): yes DVT prophylaxis: lovenox GI prophylaxis: Pantoprazole for stress ulcer prophylaxis Glucose control: as above Mobility:  bed rest for now last date of multidisciplinary goals of care discussion 12/14 Family and staff present  spouse, [name] Summary of discussion see above Follow up goals of care discussion due 12/21 Code Status: Full Disposition: admit to ICU  Labs   CBC: Recent Labs  Lab 03/23/20 1042 03/24/20 0623  WBC 15.6* 10.5  NEUTROABS 12.7*  --   HGB 11.0* 9.3*  HCT 34.7* 29.9*  MCV 93.0 93.7  PLT 219 833    Basic Metabolic Panel: Recent Labs  Lab 03/23/20 1042 03/24/20 0623 03/25/20 0328  NA 136 137 139  K 4.7 3.8 4.2  CL 95* 97* 98  CO2 32 32 34*  GLUCOSE 172* 163* 160*  BUN 21 17 17   CREATININE 0.73 0.52* 0.51*  CALCIUM 9.6 8.9 8.8*  MG  --  1.9  --   PHOS  --  3.6  --    GFR: Estimated Creatinine Clearance: 79.4 mL/min (A) (by C-G formula based on SCr of 0.51 mg/dL (L)). Recent Labs  Lab 03/23/20 1042 03/24/20 0623  WBC 15.6* 10.5  LATICACIDVEN 1.5  --     Liver Function Tests: Recent Labs  Lab 03/23/20 1042 03/25/20 0328  AST 30 18  ALT 41 25  ALKPHOS 71 61  BILITOT 0.4 0.5  PROT 8.4* 7.3  ALBUMIN 3.4* 2.8*   No results for input(s): LIPASE, AMYLASE in the last 168 hours. No results for input(s): AMMONIA in the last 168 hours.  ABG    Component Value Date/Time   PHART 7.388 08/29/2019 0538   PCO2ART 70.3 (HH) 08/29/2019 0538   PO2ART 68.4 (L) 08/29/2019 0538   HCO3 52.1 (H) 09/18/2019 1025   TCO2 28 07/08/2019 1331   O2SAT 67.3 09/18/2019 1025     Coagulation Profile: Recent Labs  Lab 03/23/20 1042  INR 1.2    Cardiac Enzymes: No results for input(s): CKTOTAL, CKMB, CKMBINDEX, TROPONINI in the last 168 hours.  HbA1C: Hgb A1c MFr Bld  Date/Time Value Ref Range Status  03/23/2020 12:39 PM 5.7 (H) 4.8 - 5.6 % Final    Comment:    (NOTE) Pre diabetes:          5.7%-6.4%  Diabetes:              >6.4%  Glycemic control for   <7.0% adults with diabetes   07/09/2019 05:10 AM 7.3 (H) 4.8 - 5.6 % Final    Comment:    (NOTE) Pre diabetes:          5.7%-6.4% Diabetes:              >6.4% Glycemic control for   <7.0% adults with  diabetes     CBG: Recent Labs  Lab 03/24/20 1554 03/24/20 2028 03/24/20 2338 03/25/20 0346 03/25/20 0753  GLUCAP 157* 115* 125* 133* 126*    Critical care time: 40 minutes     [name], MD [place] [facility] Pager: [phone] Cell: [phone] If no response, call [phone]

## 2020-03-25 NOTE — Procedures (Signed)
PCCM Video Bronchoscopy Procedure Note  The patient was informed of the risks (including but not limited to bleeding, infection, respiratory failure, lung injury, tooth/oral injury) and benefits of the procedure and gave consent, see chart.  Indication: repositioning of tracheostomy  Post Procedure Diagnosis: tracheostomy in good position  Location: [facility] ICU  Condition pre procedure: stable  Medications for procedure: none  Procedure description: The bronchoscope was introduced through the tracheostomy after we deflated the cuff, advanced the tube, and then re-inflated the cuff.  The scope was passed into the tracheostomy tube.  The carina was visualized 3cm from the carina.  The trachea was normal in appearance.    Procedures performed: none  Specimens sent: none  Condition post procedure: remains mechanically ventilated  EBL: none from procedure  Complications: none immediate  [name], MD [place] [facility] Pager: [phone] Cell: [phone] If no response, call [phone]

## 2020-03-25 NOTE — Evaluation (Addendum)
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 S/p chest tube and Tracheostomy Exchange 12/13  Clinical Impression  The patient is alert, wanting to work on sitting on bedside. Patient comes from home, patient indicates that he does not get OOB, does not have DME at home. Patient tolerated sitting  X ~ 2 minutes, trunk noted  To flex forward decreased control.   Patient on 40% fiO2 via trach and tolerated well.  Patient  Moves all extremities, noted left ankle lacks ROM. Pt admitted with above diagnosis.  Pt currently with functional limitations due to the deficits listed below (see PT Problem List). Pt will benefit from skilled PT to increase their independence and safety with mobility to allow discharge to the venue listed below.       Follow Up Recommendations Home health PT    Equipment Recommendations  None recommended by PT    Recommendations for Other Services       Precautions / Restrictions Precautions Precautions: Fall Precaution Comments: vent dependent current settings peep 5, FiO2 40%, G tube, Right CT Required Braces or Orthoses: Splint/Cast Splint/Cast: prevalon boots      Mobility  Bed Mobility Overal bed mobility: Needs Assistance Bed Mobility: Supine to Sit;Sit to Supine     Supine to sit: Max assist;+2 for physical assistance;+2 for safety/equipment;HOB elevated Sit to supine: Total assist;+2 for physical assistance;+2 for safety/equipment;HOB elevated   General bed mobility comments: patient does initiate assistance with use of UEs to sit up right to  EOB, decreased sitting tolerance with report of dizziness however BP remained stable. transferred back to semi-supine ~10min sitting upright with total A x2    Transfers                 General transfer comment: deferred  Ambulation/Gait                Stairs            Wheelchair Mobility    Modified Rankin (Stroke Patients Only)       Balance Overall balance assessment: Needs assistance Sitting-balance support: Bilateral upper extremity supported Sitting balance-Leahy Scale: Poor Sitting balance - Comments: reliant on B UE support and min assist for safety at EOB                                     Pertinent Vitals/Pain Pain Assessment: No/denies pain    Home Living Family/patient expects to be discharged to:: Private residence Living Arrangements: Spouse/significant other Available Help at Discharge: Family;Personal care attendant Type of Home: Mobile home Home Access: Ramped entrance     Home Layout: One level Home Equipment: Hospital bed Additional Comments: patient denies any medical equipment at home other than hospital bed    Prior Function Level of Independence: Needs assistance         Comments: patient reports bed bound at home, has caregiver during the day while spouse at work to suction trach and 3x/week HH aide to do bed baths and change bed linens  Hand Dominance   Dominant Hand: Right    Extremity/Trunk Assessment   Upper Extremity Assessment Upper Extremity Assessment: Defer to OT evaluation RUE Deficits / Details: grossly 3/5 LUE Deficits / Details: grossly 3/5    Lower Extremity Assessment Lower Extremity Assessment: RLE deficits/detail;LLE deficits/detail RLE Deficits / Details: has 3/5 dorsiflexion, range to neutral.  grossly 3/5 hip/ flexion and kee extension LLE Deficits / Details: lacks  dorsiflexion ROM, hip and knee grossly 3/5    Cervical / Trunk Assessment Cervical / Trunk Assessment:  Other exceptions Cervical / Trunk Exceptions: trunk forward flexed  Communication   Communication: Tracheostomy;Expressive difficulties  Cognition Arousal/Alertness: Awake/alert Behavior During Therapy: WFL for tasks assessed/performed Overall Cognitive Status: Within Functional Limits for tasks assessed                                 General Comments: patient following directions and answering questions appropriately      General Comments General comments (skin integrity, edema, etc.): VSS on vent    Exercises     Assessment/Plan    PT Assessment Patient needs continued PT services  PT Problem List Decreased strength;Decreased mobility;Decreased range of motion;Decreased activity tolerance;Cardiopulmonary status limiting activity;Decreased balance;Decreased knowledge of use of DME       PT Treatment Interventions Functional mobility training;Therapeutic activities;Therapeutic exercise;Patient/family education    PT Goals (Current goals can be found in the Care Plan section)  Acute Rehab PT Goals Patient Stated Goal: to get OOB more PT Goal Formulation: With patient Time For Goal Achievement: 04/08/20 Potential to Achieve Goals: Fair    Frequency Min 2X/week   Barriers to discharge        Co-evaluation PT/OT/SLP Co-Evaluation/Treatment: Yes Reason for Co-Treatment: Complexity of the patient's impairments (multi-system involvement);For patient/therapist safety;To address functional/ADL transfers PT goals addressed during session: Mobility/safety with mobility OT goals addressed during session: ADL's and self-care       AM-PAC PT "6 Clicks" Mobility  Outcome Measure Help needed turning from your back to your side while in a flat bed without using bedrails?: Total Help needed moving from lying on your back to sitting on the side of a flat bed without using bedrails?: Total Help needed moving to and from a bed to a chair (including a wheelchair)?:  Total Help needed standing up from a chair using your arms (e.g., wheelchair or bedside chair)?: Total Help needed to walk in hospital room?: Total Help needed climbing 3-5 steps with a railing? : Total 6 Click Score: 6    End of Session Equipment Utilized During Treatment:  (on Vent. trached) Activity Tolerance: Patient tolerated treatment well Patient left: in bed;with call bell/phone within reach;with nursing/sitter in room Nurse Communication: Mobility status PT Visit Diagnosis: Muscle weakness (generalized) (M62.81)    Time: 9937-1696 PT Time Calculation (min) (ACUTE ONLY): 20 min   Charges:   PT Evaluation $PT Eval Low Complexity: [place] Pager [phone] Office [phone]  Claretha Cooper 03/25/2020, 1:13 PM

## 2020-03-25 NOTE — Evaluation (Signed)
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 S/p chest tube and Tracheostomy Exchange 12/13   Clinical Impression   Patient lives at home with spouse and caregiver when spouse at work. Patient is vent dependent, reports bed bound at home with aide 3x/week that provides bed baths and change bed linens. In addition, patient has caregiver that is present when spouse at work to primarily suction trach per patient. Patient states he has theraband at home and does his own therapy for UEs/LEs "inconsistently." Currently patient max A x2 for supine to sit with decreased sitting tolerance reporting dizziness despite stable BP. Patient require B UE support and min A for safety, tolerate ~2 mins before total A x2 back to bed. Patient appears motivated, wanting therapy to work with him to build on OOB activity tolerance and mobility. Recommend continued acute OT services to maximize patient sitting balance, endurance, global strengthening in order to reduce caregiver burden and facilitate D/C to venue listed below.    Follow Up Recommendations  Home health OT;Supervision/Assistance - 24 hour    Equipment Recommendations  Other (comment);3 in 1 bedside commode (pending progress/mobility)       Precautions / Restrictions Precautions Precautions: Fall Precaution Comments: vent dependent current settings peep 5, FiO2 40%      Mobility Bed Mobility Overal bed mobility: Needs Assistance Bed Mobility: Supine to Sit;Sit to Supine     Supine to sit: Max assist;+2 for physical assistance;+2 for  safety/equipment;HOB elevated Sit to supine: Total assist;+2 for physical assistance;+2 for safety/equipment;HOB elevated   General bed mobility comments: patient does initiate assistance with use of UEs to sit up right to EOB, decreased sitting tolerance with report of dizziness however BP remained stable. transferred back to semi-supine ~64min sitting upright with total A x2    Transfers                 General transfer comment: deferred    Balance Overall balance assessment: Needs assistance Sitting-balance support: Bilateral upper extremity supported Sitting balance-Leahy Scale: Poor Sitting balance - Comments: reliant on B UE support and min assist for safety at EOB                                   ADL either performed or assessed with clinical judgement   ADL Overall ADL's : Needs assistance/impaired Eating/Feeding: NPO (peg tube)   Grooming: Bed level;Minimal assistance Grooming Details (indicate cue type and reason): patient with intact UE ROM and strength to be able to wash his face Upper Body Bathing: Bed level;Total assistance   Lower Body Bathing: Total assistance;Bed level   Upper Body Dressing : Bed level;Total assistance   Lower Body Dressing: Total assistance;Bed level     Toilet Transfer Details (indicate cue type and reason): deferred, patient has been bed bound at home for months Toileting- Clothing Manipulation and Hygiene: Total assistance;Bed level       Functional mobility during ADLs: Maximal assistance;+2 for physical assistance;+2 for safety/equipment General ADL Comments: patient is motivated to participate in therapy, has required  total care at home but shows potential to progress activity tolerance for edge of bed/OOB activity                  Pertinent Vitals/Pain Pain Assessment: No/denies pain     Hand Dominance Right   Extremity/Trunk Assessment Upper Extremity Assessment Upper Extremity Assessment: RUE  deficits/detail;LUE deficits/detail RUE Deficits / Details: grossly 3/5 LUE Deficits / Details: grossly 3/5   Lower Extremity Assessment Lower Extremity Assessment: Defer to PT evaluation       Communication Communication Communication: Tracheostomy;Expressive difficulties   Cognition Arousal/Alertness: Awake/alert Behavior During Therapy: WFL for tasks assessed/performed Overall Cognitive Status: Within Functional Limits for tasks assessed                                 General Comments: patient following directions and answering questions appropriately   General Comments  VSS on vent            Home Living Family/patient expects to be discharged to:: Private residence Living Arrangements: Spouse/significant other Available Help at Discharge: Family;Personal care attendant Type of Home: Mobile home       Home Layout: One level                   Additional Comments: patient denies any medical equipment at home other than hospital bed      Prior Functioning/Environment Level of Independence: Needs assistance        Comments: patient reports bed bound at home, has caregiver during the day while spouse at work to suction trach and 3x/week HH aide to do bed baths and change bed linens        OT Problem List: Decreased activity tolerance;Impaired balance (sitting and/or standing)      OT Treatment/Interventions: Self-care/ADL training;Therapeutic exercise;Therapeutic activities;Patient/family education;Balance training;DME and/or AE instruction    OT Goals(Current goals can be found in the care plan section) Acute Rehab OT Goals Patient Stated Goal: to get OOB more OT Goal Formulation: With patient Time For Goal Achievement: 04/08/20 Potential to Achieve Goals: Good  OT Frequency: Min 2X/week           Co-evaluation PT/OT/SLP Co-Evaluation/Treatment: Yes Reason for Co-Treatment: Complexity of the patient's impairments (multi-system  involvement);For patient/therapist safety;To address functional/ADL transfers   OT goals addressed during session: ADL's and self-care      AM-PAC OT "6 Clicks" Daily Activity     Outcome Measure Help from another person eating meals?: Total (peg tube) Help from another person taking care of personal grooming?: A Little Help from another person toileting, which includes using toliet, bedpan, or urinal?: Total Help from another person bathing (including washing, rinsing, drying)?: Total Help from another person to put on and taking off regular upper body clothing?: Total Help from another person to put on and taking off regular lower body clothing?: Total 6 Click Score: 8   End of Session Equipment Utilized During Treatment: Other (comment) (vent) Nurse Communication: Other (comment) (RN present for therapy session)  Activity Tolerance: Patient tolerated treatment well;Patient limited by fatigue Patient left: in bed;with call bell/phone within reach;with nursing/sitter in room  OT Visit Diagnosis: Other abnormalities of gait and mobility (R26.89)                Time: 5170-0174 OT Time Calculation (min): 20 min Charges:  OT General Charges $OT Visit: 1 Visit OT Evaluation $OT Eval Moderate Complexity: 1 Mod  Raquel Sarna  Susy Frizzle OT OT pager: [phone]  Rosemary Holms 03/25/2020, 11:07 AM

## 2020-03-25 NOTE — Progress Notes (Signed)
[facility]  Regarding the tracheostomy leak: there is continued air leak after repositioning the tracheostomy tube.  Imaging of the tube on repeat chest imaging showed little change.  I'm worried that the tracheostomy is so large that the tube does not stay in place.  Discussed with ENT who recommended placement of a #8 shiley.  I think that this is reasonable, but if the tube does not stay in place then we need to consider other options for trachea repair.  [name], MD [place] [facility] Pager: [phone] Cell: [phone] If no response, call [phone]

## 2020-03-25 NOTE — Progress Notes (Signed)
eLink Physician-Brief Progress Note Patient Name: Steve [name] DOB: 12-Jun-1955 MRN: [id number]   Date of Service  03/25/2020  HPI/Events of Note  Anxiety  eICU Interventions  Plan: 1. Restart home Ativan 0.5 mg per tube Q 4 hours PRN anxiety.      Intervention Category Major Interventions: Other:  [name] Cornelia Copa 03/25/2020, 2:42 AM

## 2020-03-25 NOTE — Progress Notes (Signed)
Wife updated via phone on plan of care.  Reviewed PT efforts. Discussed trach leak / issues with ENT early in the day.  See Dr. Anastasia Pall notes.  ENT review pending.  Wife indicates understanding.  Will follow up on ENT recommendations.   Noe Gens, MSN, NP-C, AGACNP-BC [place] [facility] 03/25/2020, 4:54 PM   Please see [email or web address] for pager details.

## 2020-03-26 ENCOUNTER — Inpatient Hospital Stay (HOSPITAL_COMMUNITY): Payer: BC Managed Care – PPO

## 2020-03-26 LAB — CULTURE, RESPIRATORY W GRAM STAIN

## 2020-03-26 LAB — COMPREHENSIVE METABOLIC PANEL
ALT: 18 U/L (ref 0–44)
AST: 15 U/L (ref 15–41)
Albumin: 3.1 g/dL — ABNORMAL LOW (ref 3.5–5.0)
Alkaline Phosphatase: 57 U/L (ref 38–126)
Anion gap: 9 (ref 5–15)
BUN: 23 mg/dL (ref 8–23)
CO2: 33 mmol/L — ABNORMAL HIGH (ref 22–32)
Calcium: 9 mg/dL (ref 8.9–10.3)
Chloride: 98 mmol/L (ref 98–111)
Creatinine, Ser: 0.41 mg/dL — ABNORMAL LOW (ref 0.61–1.24)
GFR, Estimated: >60 mL/min (ref >60)
Glucose, Bld: 96 mg/dL (ref 70–99)
Potassium: 4.2 mmol/L (ref 3.5–5.1)
Sodium: 140 mmol/L (ref 135–145)
Total Bilirubin: 0.4 mg/dL (ref 0.3–1.2)
Total Protein: 7.4 g/dL (ref 6.5–8.1)

## 2020-03-26 LAB — GLUCOSE, CAPILLARY
Glucose-Capillary: 117 mg/dL — ABNORMAL HIGH (ref 70–99)
Glucose-Capillary: 86 mg/dL (ref 70–99)
Glucose-Capillary: 116 mg/dL — ABNORMAL HIGH (ref 70–99)
Glucose-Capillary: 117 mg/dL — ABNORMAL HIGH (ref 70–99)
Glucose-Capillary: 143 mg/dL — ABNORMAL HIGH (ref 70–99)

## 2020-03-26 LAB — CBC WITH DIFFERENTIAL/PLATELET
Abs Immature Granulocytes: 0.03 10*3/uL (ref 0.00–0.07)
Basophils Absolute: 0 10*3/uL (ref 0.0–0.1)
Basophils Relative: 0 %
Eosinophils Absolute: 0.6 10*3/uL — ABNORMAL HIGH (ref 0.0–0.5)
Eosinophils Relative: 6 %
HCT: 31.3 % — ABNORMAL LOW (ref 39.0–52.0)
Hemoglobin: 9.7 g/dL — ABNORMAL LOW (ref 13.0–17.0)
Immature Granulocytes: 0 %
Lymphocytes Relative: 29 %
Lymphs Abs: 2.9 10*3/uL (ref 0.7–4.0)
MCH: 28.8 pg (ref 26.0–34.0)
MCHC: 31 g/dL (ref 30.0–36.0)
MCV: 92.9 fL (ref 80.0–100.0)
Monocytes Absolute: 1 10*3/uL (ref 0.1–1.0)
Monocytes Relative: 10 %
Neutro Abs: 5.3 10*3/uL (ref 1.7–7.7)
Neutrophils Relative %: 55 %
Platelets: 232 10*3/uL (ref 150–400)
RBC: 3.37 MIL/uL — ABNORMAL LOW (ref 4.22–5.81)
RDW: 13.6 % (ref 11.5–15.5)
WBC: 9.8 10*3/uL (ref 4.0–10.5)
nRBC: 0 % (ref 0.0–0.2)

## 2020-03-26 NOTE — Progress Notes (Addendum)
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  Past Medical History  Sleep apnea, on CPAP at home Post Covid pneumonia pulmonary fibrosis, on mechanical ventilation Chronic respiratory failure with hypoxemia on mechanical ventilation at home Hodgkin's lymphoma Hypertension History of pancreatitis Gastroesophageal reflux disease Diabetes mellitus type 2  Significant Hospital Events   12/13 Admit with pneumothorax  12/15 Trach change (attempted #8 shiley without resolution of leak), ordered #8distal cuffed  Consults:    Procedures:  Chronic Trach >> Right Chest Tube 12/13 >>   Significant Diagnostic Tests:  CXR 12/13 >> R PTX, chronic fibrotic changes, trach in place  FOB 12/13 >> no clear tracheal injury  CT Chest 12/13 >> severe changes of pulmonary fibrosis & extensive bronchiectasis, no acute pulmonary process, stable right chest tube, enlarged mediastinal / hilar lymph nodes, enlarged pulmonary arteries CT Soft Tissue Neck 12/13 >> balloon distention of trachea  Micro Data:  COVID 12/13 >> negative  Influenza A/B 12/13 >> negative  BCx2 12/13 >>  Tracheal aspirate 12/13 >> abundant WBC, few diphtheroids >>   Antimicrobials:  Ceftriaxone 12/13 >>   Interim history/subjective:  Pt reports his throat is sore from trach changes yesterday  Afebrile RN reports no acute events  Remains on vent   Objective   Blood pressure 111/68, pulse 89, temperature 98.4 F (36.9 C), temperature source Oral, resp. rate (!) 25, height 5\' 5"  (1.651 m), weight 60.2  kg, SpO2 100 %.    Vent Mode: PCV FiO2 (%):  [40 %] 40 % Set Rate:  [24 bmp] 24 bmp PEEP:  [5 cmH20] 5 cmH20 Pressure Support:  [5 cmH20] 5 cmH20 Plateau Pressure:  [13 cmH20-20 cmH20] 14 cmH20   Intake/Output Summary (Last 24 hours) at 03/26/2020 4709 Last data filed at 03/26/2020 6283 Gross per 24 hour  Intake 282.03 ml  Output 2150 ml  Net -1867.97 ml   Filed Weights   03/23/20 1235 03/24/20 0423 03/26/20 0447  Weight: 60.2 kg 60.2 kg 60.2 kg    Examination: General: chronically ill appearing adult male lying in bed in NAD on vent   HEENT: MM pink/moist, trach midline, large leak noted, able to speak around trach  Neuro: AAOx4, MAE / generalized weakness  CV: s1s2 RRR, no m/r/g PULM: non-labored on vent, lungs bilaterally clear, R anterior chest tube in place on water seal GI: soft, bsx4 active, PEG with dried hard secretions at site,  Extremities: warm/dry, no edema, muscle wasting of extremities   Skin: no rashes or lesions  Resolved Hospital Problem list   Tachycardia: resolved Need for sedation for ventilator synchrony during procedure: resolved  Assessment & Plan:   Acute on chronic respiratory failure with hypoxemia due to right pneumothorax Chronic respiratory failure dependent on home ventilator Tracheostomy status  Concern for malpositioned trach based on exam and large cuff leak due to trach not being changed for some time. Trach changed to #8 on 12/15 without significant improvement in leak, positioned confirmed with FOB.  Reviewed with ENT.  -plan to insert #8 distal XLT, if no improvement will  ask for formal ENT evaluation > may need exploration of trachea / stoma  -PCV 20 as rest mode, note he has large leak  -VAP prevention measures  -work toward daily SBT effort, would like to see him start PSV trials  -follow intermittent CXR  -discontinue chest tube with follow up CXR post removal   Community-acquired pneumonia -continue ceftriaxone D4/5    Anxiety -PRN ativan   Physical deconditioning History of lymphoma, untreated -PT/OT evaluation  -OOB to chair   DM2 -SSI, moderate scale  -HS coverage   Best practice (evaluated daily)  Diet: NPO Pain/Anxiety/Delirium protocol (if indicated): as above VAP protocol (if indicated): yes DVT prophylaxis: lovenox GI prophylaxis: PPI Glucose control: SSI Mobility: As tolerated  Last date of multidisciplinary goals of care discussion 12/14 Family and staff present spouse, [name] Summary of discussion see above Follow up goals of care discussion due 12/21 Code Status: Full Code Disposition: ICU  Labs   CBC: Recent Labs  Lab 03/23/20 1042 03/24/20 0623 03/26/20 0407  WBC 15.6* 10.5 9.8  NEUTROABS 12.7*  --  5.3  HGB 11.0* 9.3* 9.7*  HCT 34.7* 29.9* 31.3*  MCV 93.0 93.7 92.9  PLT 219 211 427    Basic Metabolic Panel: Recent Labs  Lab 03/23/20 1042 03/24/20 0623 03/25/20 0328 03/26/20 0407  NA 136 137 139 140  K 4.7 3.8 4.2 4.2  CL 95* 97* 98 98  CO2 32 32 34* 33*  GLUCOSE 172* 163* 160* 96  BUN 21 17 17 23   CREATININE 0.73 0.52* 0.51* 0.41*  CALCIUM 9.6 8.9 8.8* 9.0  MG  --  1.9  --   --   PHOS  --  3.6  --   --    GFR: Estimated Creatinine Clearance: 79.4 mL/min (A) (by C-G formula based on SCr of 0.41 mg/dL (L)). Recent Labs  Lab 03/23/20 1042 03/24/20 0623 03/26/20 0407  WBC 15.6* 10.5 9.8  LATICACIDVEN 1.5  --   --     Liver Function Tests: Recent Labs  Lab 03/23/20 1042 03/25/20 0328 03/26/20 0407  AST 30 18 15   ALT 41 25 18  ALKPHOS 71 61 57  BILITOT 0.4 0.5 0.4  PROT 8.4* 7.3 7.4  ALBUMIN 3.4* 2.8* 3.1*   No results for input(s): LIPASE, AMYLASE in the last 168 hours. No results for input(s): AMMONIA in the last 168 hours.  ABG    Component Value Date/Time   PHART 7.388 08/29/2019 0538   PCO2ART 70.3 (HH) 08/29/2019 0538   PO2ART 68.4 (L) 08/29/2019 0538   HCO3 52.1 (H) 09/18/2019 1025   TCO2 28 07/08/2019 1331    O2SAT 67.3 09/18/2019 1025     Coagulation Profile: Recent Labs  Lab 03/23/20 1042  INR 1.2    Cardiac Enzymes: No results for input(s): CKTOTAL, CKMB, CKMBINDEX, TROPONINI in the last 168 hours.  HbA1C: Hgb A1c MFr Bld  Date/Time Value Ref Range Status  03/23/2020 12:39 PM 5.7 (H) 4.8 - 5.6 % Final    Comment:    (NOTE) Pre diabetes:          5.7%-6.4%  Diabetes:              >6.4%  Glycemic control for   <7.0% adults with diabetes   07/09/2019 05:10 AM 7.3 (H) 4.8 - 5.6 % Final    Comment:    (NOTE) Pre diabetes:          5.7%-6.4% Diabetes:              >  6.4% Glycemic control for   <7.0% adults with diabetes     CBG: Recent Labs  Lab 03/25/20 1148 03/25/20 1540 03/25/20 1944 03/25/20 2352 03/26/20 0350  GLUCAP 117* 126* 112* 123* 86    Critical care time: 32 minutes     Noe Gens, MSN, NP-C, AGACNP-BC [place] [facility] 03/26/2020, 10:19 AM   Please see [email or web address] for pager details.

## 2020-03-26 NOTE — Progress Notes (Signed)
Trach exchange performed by CCM and RT (see provider note). Pt tolerated procedure well, without complication. Pt now has a 8 XLT trach. This RN will continue to carefully monitor pt.

## 2020-03-26 NOTE — Procedures (Signed)
Tracheostomy Exchange Procedure Note  MUNACHIMSO PALIN  [id number]  07-Apr-1956  Date:03/26/20  Time:11:22 AM   Provider Performing:Brent Lawrence Roldan   Procedure: Tracheostomy Exchange Through Immature Stoma [phone])  Indication(s) Continued air leak from tracheostomy stoma  Consent Risks of the procedure as well as the alternatives and risks of each were explained to the patient and/or caregiver.  Consent for the procedure was obtained and is signed in the bedside chart  Anesthesia None   Time Out Verified patient identification, verified procedure, site/side was marked, verified correct patient position, special equipment/implants available, medications/allergies/relevant history reviewed, required imaging and test results available.   Sterile Technique Hand hygiene, gloves   Procedure Description Size 6 XLT cuffed existing Shiley removed and size 8 XLT distal cuffed Shiley placed through stoma.  After the procedure I passed a bronchoscope in to check placement. It was 3cm above the carina, there was significant granulation tissue adjacent to the distal opening of the tracheostomy.  See picture below.       Complications/Tolerance None; patient tolerated the procedure well..   EBL Minimal   [name], MD [place] [facility] Pager: [phone] Cell: [phone] If no response, call [phone]

## 2020-03-26 NOTE — Progress Notes (Signed)
Right Chest tube removed per CCM order. Pt tolerated procedure well, without complication. STAT CXR ordered per protocol. Occlusive dressing applied by this RN. RN will continue to carefully monitor pt.

## 2020-03-26 NOTE — TOC Progression Note (Signed)
Transition of Care [facility]) - Progression Note    Patient Details  Name: Steve [name] MRN: [id number] Date of Birth: [birth year]  Transition of Care [facility]) CM/SW Contact  Leeroy Cha, RN Phone Number: 03/26/2020, 1:33 PM  Clinical Narrative:    Impression/Plan:  Pneumothorax> remove right chest tube  Tracheostomy with air leak> plan replace tracheostomy with #8 distal XLT today (cuffed), if that doesn't prevent the air leak then suspect he will need surgical exploration in the OR.    Chronic respiratory failure with hypoxemia, post COVID fibrosis> wean down vent as able after replacing tracheostomy  Physical deconditioning> PT/OT consulted  PLAN:patient does not want to go back to [facility] may consider snf but main goal is to return to home with hhc. Following for progression.  Expected Discharge Plan: [place] Barriers to Discharge: No Barriers Identified  Expected Discharge Plan and Services Expected Discharge Plan: [place]   Discharge Planning Services: CM Consult   Living arrangements for the past 2 months: Mobile Home                                       Social Determinants of Health (SDOH) Interventions    Readmission Risk Interventions Readmission Risk Prevention Plan 07/22/2019  Transportation Screening Complete  Medication Review Press photographer) Complete  Some recent data might be hidden

## 2020-03-26 NOTE — Telephone Encounter (Signed)
I have gotten no response back besides the 2 I sent to [facility] and [facility] and neither will take the patient

## 2020-03-26 NOTE — Plan of Care (Signed)
This RN will continue to monitor patient's progression of care plan.  

## 2020-03-26 NOTE — Telephone Encounter (Signed)
Will forward to Dr [name] to make him aware.

## 2020-03-26 NOTE — Progress Notes (Signed)
Upon AM assessment, pt was found to have a moderate amount of purulent/serosanguinous drainage around PEG tube site. CCM also assessed at bedside. This RN cleansed site with NS and applied new gauze dressing. RN will continue to assess.

## 2020-03-27 ENCOUNTER — Inpatient Hospital Stay (HOSPITAL_COMMUNITY): Payer: BC Managed Care – PPO

## 2020-03-27 LAB — GLUCOSE, CAPILLARY
Glucose-Capillary: 120 mg/dL — ABNORMAL HIGH (ref 70–99)
Glucose-Capillary: 120 mg/dL — ABNORMAL HIGH (ref 70–99)
Glucose-Capillary: 128 mg/dL — ABNORMAL HIGH (ref 70–99)
Glucose-Capillary: 130 mg/dL — ABNORMAL HIGH (ref 70–99)
Glucose-Capillary: 140 mg/dL — ABNORMAL HIGH (ref 70–99)
Glucose-Capillary: 151 mg/dL — ABNORMAL HIGH (ref 70–99)

## 2020-03-27 LAB — BASIC METABOLIC PANEL
Anion gap: 12 (ref 5–15)
BUN: 19 mg/dL (ref 8–23)
CO2: 29 mmol/L (ref 22–32)
Calcium: 8.9 mg/dL (ref 8.9–10.3)
Chloride: 95 mmol/L — ABNORMAL LOW (ref 98–111)
Creatinine, Ser: 0.62 mg/dL (ref 0.61–1.24)
GFR, Estimated: >60 mL/min (ref >60)
Glucose, Bld: 133 mg/dL — ABNORMAL HIGH (ref 70–99)
Potassium: 4.8 mmol/L (ref 3.5–5.1)
Sodium: 136 mmol/L (ref 135–145)

## 2020-03-27 LAB — CBC
HCT: 31.2 % — ABNORMAL LOW (ref 39.0–52.0)
Hemoglobin: 9.8 g/dL — ABNORMAL LOW (ref 13.0–17.0)
MCH: 29.3 pg (ref 26.0–34.0)
MCHC: 31.4 g/dL (ref 30.0–36.0)
MCV: 93.1 fL (ref 80.0–100.0)
Platelets: 214 10*3/uL (ref 150–400)
RBC: 3.35 MIL/uL — ABNORMAL LOW (ref 4.22–5.81)
RDW: 13.8 % (ref 11.5–15.5)
WBC: 9.6 10*3/uL (ref 4.0–10.5)
nRBC: 0 % (ref 0.0–0.2)

## 2020-03-27 NOTE — Progress Notes (Signed)
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 S/p chest tube and Tracheostomy Exchange 12/13   OT comments  Patient demonstrates increased activity tolerance this session, sitting EOB for ~8 mins while participating in g/h and LE exercise. Patient require min A for sitting balance with dynamic activity and increased time between activity for rest breaks due to increased respiration rate. Continues to be motivated and demonstrates good rehab potential.   Follow Up Recommendations  Home health OT;Supervision/Assistance - 24 hour    Equipment Recommendations  Other (comment);3 in 1 bedside commode (pending patient progress)       Precautions / Restrictions Precautions Precautions: Fall Precaution Comments: vent dependent current settings peep 5, FiO2 40% Splint/Cast: prevalon boots Restrictions Weight Bearing Restrictions: No       Mobility Bed Mobility Overal bed mobility: Needs Assistance Bed Mobility: Supine to Sit;Sit to Supine     Supine to sit: Min assist;+2 for safety/equipment;HOB elevated Sit to supine: Min assist;+2 for safety/equipment;HOB elevated   General bed mobility comments: patient initiates bed mobility, requires assist to mobilize L LE toward EOB and min A to elevate trunk to sitting. increased time at EOB to scoot out and obtain balance with min A for safety. patient able to initaite lifting LEs and lowering trunk back onto bed with min A with LEs  Transfers                 General transfer comment: deferred    Balance Overall balance  assessment: Needs assistance Sitting-balance support: Feet supported Sitting balance-Leahy Scale: Fair Sitting balance - Comments: able to wash face seated EOB                                   ADL either performed or assessed with clinical judgement   ADL Overall ADL's : Needs assistance/impaired     Grooming: Wash/dry face;Set up;Sitting Grooming Details (indicate cue type and reason): patient able to maintain sitting balance at EOB in order to wash facec                                               Cognition Arousal/Alertness: Awake/alert Behavior During Therapy: [facility] for tasks assessed/performed Overall Cognitive Status: Within Functional Limits for tasks assessed                                          Exercises Exercises: General Lower Extremity General Exercises - Lower Extremity Long Arc Quad: AROM;Both;5 reps;Seated Hip Flexion/Marching: AROM;Both;5 reps;Seated           Pertinent Vitals/ Pain       Pain Assessment: Faces Faces Pain Scale: No hurt         Frequency  Min 2X/week        Progress Toward Goals  OT Goals(current goals can now  be found in the care plan section)  Progress towards OT goals: Progressing toward goals  Acute Rehab OT Goals Patient Stated Goal: to get OOB more OT Goal Formulation: With patient Time For Goal Achievement: 04/08/20 Potential to Achieve Goals: Good ADL Goals Additional ADL Goal #1: Patient will require mod A for bed mobility in preparation for self care tasks. Additional ADL Goal #2: Patient will sit up EOB at supervision level for 6 minutes in order to participate in self care tasks. Additional ADL Goal #3: Patient will wash UB seated EOB at supervision level in order to reduce caregiver burden.  Plan Discharge plan remains appropriate       AM-PAC OT "6 Clicks" Daily Activity     Outcome Measure   Help from another person eating meals?: Total (peg  tube) Help from another person taking care of personal grooming?: A Little Help from another person toileting, which includes using toliet, bedpan, or urinal?: Total Help from another person bathing (including washing, rinsing, drying)?: A Lot Help from another person to put on and taking off regular upper body clothing?: A Lot Help from another person to put on and taking off regular lower body clothing?: Total 6 Click Score: 10    End of Session Equipment Utilized During Treatment: Other (comment) (vent)  OT Visit Diagnosis: Other abnormalities of gait and mobility (R26.89)   Activity Tolerance Patient tolerated treatment well   Patient Left in bed;with call bell/phone within reach   Nurse Communication Mobility status        Time: 6553-7482 OT Time Calculation (min): 24 min  Charges: OT General Charges $OT Visit: 1 Visit OT Treatments $Self Care/Home Management : 23-37 mins  [name] OT OT pager: [place] 03/27/2020, 2:08 PM

## 2020-03-27 NOTE — Progress Notes (Signed)
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  Past Medical History  Sleep apnea, on CPAP at home Post Covid pneumonia pulmonary fibrosis, on mechanical ventilation Chronic respiratory failure with hypoxemia on mechanical ventilation at home Hodgkin's lymphoma Hypertension History of pancreatitis Gastroesophageal reflux disease Diabetes mellitus type 2  Significant Hospital Events   December 13 admission, chest tube placed, trache exchanged 12/15 trach changed to #8 shiley, worsening cuff leak, #6 XLT distal put back in  12/16 trach changed to #8 distal XLT, no cuff leak, removed chest tube  Consults:    Procedures:  Tracheostomy present prior to admission 9/13 right chest tube > 9/16  Significant Diagnostic Tests:  September 13 chest x-ray showed right-sided pneumothorax, chronic fibrotic changes, tracheostomy in place Bronchoscopy 12/13> no clear tracheal injury  Micro Data:  December 13 respiratory culture > gram negative rods December 13 SARS-CoV-2/influenza negative  Antimicrobials:  December 13 ceftriaxone  > 12/17  Interim history/subjective:   Some hypoxemia after suctioning this morning Feeling better today No air leak around tracheostomy   Objective   Blood pressure 99/63, pulse 85, temperature 97.8 F (36.6 C), temperature source Oral, resp. rate (!) 22, height 5\' 5"  (1.651 m), weight 62.4 kg, SpO2 100 %.    Vent Mode: PCV FiO2 (%):  [40 %-50 %] 50 % Set Rate:  [24 bmp] 24 bmp PEEP:  [5 cmH20] 5 cmH20 Plateau Pressure:  [19  cmH20-27 cmH20] 24 cmH20   Intake/Output Summary (Last 24 hours) at 03/27/2020 8119 Last data filed at 03/27/2020 0710 Gross per 24 hour  Intake 4606.64 ml  Output 1361 ml  Net 3245.64 ml   Filed Weights   03/24/20 0423 03/26/20 0447 03/27/20 0400  Weight: 60.2 kg 60.2 kg 62.4 kg    Examination:  General:  In bed on vent HENT: NCAT ETT in place PULM: CTA B, vent supported breathing CV: RRR, no mgr GI: BS+, soft, nontender MSK: normal bulk and tone Neuro: sedated on vent  12/15 cxr personally reviewed> tracheostomy tube in place, bilateral fibrotic   Resolved Hospital Problem list   Tachycardia: resolved  Assessment & Plan:  Acute on chronic respiratory failure with hypoxemia  Right pneumothorax > resolved Chronic respiratory failure dependent on home ventilator > it is not clear to me how much vent support he needs, there has been little effort to wean him off the vent in the last 4-5 months. 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 there is no cuff leak, re Maintain #8 distal XLT trach If cuff leak/air leak again then he needs to go to the OR to have exploration of tracheostomy Wean down pressure control on vent slowly over next 2-3 days: would like to see if we can get him off the vent VAP prevention CXR prn  Community-acquired pneumonia: Stop ceftriaxone today  Anxiety Ativan prn  Physical deconditioning PT/OT consult  History of lymphoma> treated in spring 2021 prior to [place], no assessment since then Consider oncology consult  Goals of care: See note 12/14   DM2 SSI q4h  Best practice (evaluated daily)   Diet: npo Pain/Anxiety/Delirium protocol (if indicated): as above VAP protocol (if indicated): yes DVT prophylaxis: lovenox GI prophylaxis: Pantoprazole for stress ulcer prophylaxis Glucose control: as above Mobility: bed rest for now last date of multidisciplinary goals of care discussion 12/14 Family and staff present spouse,  [name] Summary of discussion see above Follow up goals of care discussion due 12/21 Code Status: Full Disposition: admit to ICU  Labs   CBC: Recent Labs  Lab 03/23/20 1042 03/24/20 0623 03/26/20 0407 03/27/20 0607  WBC 15.6* 10.5 9.8 9.6  NEUTROABS 12.7*  --  5.3  --   HGB 11.0* 9.3* 9.7* 9.8*  HCT 34.7* 29.9* 31.3* 31.2*  MCV 93.0 93.7 92.9 93.1  PLT 219 211 232 811    Basic Metabolic Panel: Recent Labs  Lab 03/23/20 1042 03/24/20 0623 03/25/20 0328 03/26/20 0407 03/27/20 0607  NA 136 137 139 140 136  K 4.7 3.8 4.2 4.2 4.8  CL 95* 97* 98 98 95*  CO2 32 32 34* 33* 29  GLUCOSE 172* 163* 160* 96 133*  BUN 21 17 17 23 19   CREATININE 0.73 0.52* 0.51* 0.41* 0.62  CALCIUM 9.6 8.9 8.8* 9.0 8.9  MG  --  1.9  --   --   --   PHOS  --  3.6  --   --   --    GFR: Estimated Creatinine Clearance: 81.1 mL/min (by C-G formula based on SCr of 0.62 mg/dL). Recent Labs  Lab 03/23/20 1042 03/24/20 0623 03/26/20 0407 03/27/20 0607  WBC 15.6* 10.5 9.8 9.6  LATICACIDVEN 1.5  --   --   --     Liver Function Tests: Recent Labs  Lab 03/23/20 1042 03/25/20 0328 03/26/20 0407  AST 30 18 15   ALT 41 25 18  ALKPHOS 71 61 57  BILITOT 0.4 0.5 0.4  PROT 8.4* 7.3 7.4  ALBUMIN 3.4* 2.8* 3.1*   No results for input(s): LIPASE, AMYLASE in the last 168 hours. No results for input(s): AMMONIA in the last 168 hours.  ABG    Component Value Date/Time   PHART 7.388 08/29/2019 0538   PCO2ART 70.3 (HH) 08/29/2019 0538   PO2ART 68.4 (L) 08/29/2019 0538   HCO3 52.1 (H) 09/18/2019 1025   TCO2 28 07/08/2019 1331   O2SAT 67.3 09/18/2019 1025     Coagulation Profile: Recent Labs  Lab 03/23/20 1042  INR 1.2    Cardiac Enzymes: No results for input(s): CKTOTAL, CKMB, CKMBINDEX, TROPONINI in the last 168 hours.  HbA1C: Hgb A1c MFr Bld  Date/Time Value Ref Range Status  03/23/2020 12:39 PM 5.7 (H) 4.8 - 5.6 % Final    Comment:    (NOTE) Pre diabetes:           5.7%-6.4%  Diabetes:              >6.4%  Glycemic control for   <7.0% adults with diabetes   07/09/2019 05:10 AM 7.3 (H) 4.8 - 5.6 % Final    Comment:    (NOTE) Pre diabetes:          5.7%-6.4% Diabetes:              >6.4% Glycemic control for   <7.0% adults with diabetes     CBG: Recent Labs  Lab 03/26/20 1612 03/26/20 1920 03/27/20 0004 03/27/20 0311 03/27/20 0732  GLUCAP 117* 143* 140* 151* 128*    Critical care time: 35 minutes     [name],  MD [place] [facility] Pager: [phone] Cell: [phone] If no response, call [phone]

## 2020-03-28 ENCOUNTER — Inpatient Hospital Stay (HOSPITAL_COMMUNITY): Payer: BC Managed Care – PPO

## 2020-03-28 DIAGNOSIS — Z9911 Dependence on respirator [ventilator] status: Secondary | ICD-10-CM

## 2020-03-28 DIAGNOSIS — J9620 Acute and chronic respiratory failure, unspecified whether with hypoxia or hypercapnia: Secondary | ICD-10-CM

## 2020-03-28 DIAGNOSIS — Z930 Tracheostomy status: Secondary | ICD-10-CM

## 2020-03-28 LAB — CBC WITH DIFFERENTIAL/PLATELET
Abs Immature Granulocytes: 0 10*3/uL (ref 0.00–0.07)
Band Neutrophils: 0 %
Basophils Absolute: 0 10*3/uL (ref 0.0–0.1)
Basophils Relative: 0 %
Blasts: 0 %
Eosinophils Absolute: 0.5 10*3/uL (ref 0.0–0.5)
Eosinophils Relative: 6 %
HCT: 28.5 % — ABNORMAL LOW (ref 39.0–52.0)
Hemoglobin: 9 g/dL — ABNORMAL LOW (ref 13.0–17.0)
Lymphocytes Relative: 26 %
Lymphs Abs: 2.4 10*3/uL (ref 0.7–4.0)
MCH: 29.5 pg (ref 26.0–34.0)
MCHC: 31.6 g/dL (ref 30.0–36.0)
MCV: 93.4 fL (ref 80.0–100.0)
Metamyelocytes Relative: 0 %
Monocytes Absolute: 0.9 10*3/uL (ref 0.1–1.0)
Monocytes Relative: 10 %
Myelocytes: 0 %
Neutro Abs: 5.3 10*3/uL (ref 1.7–7.7)
Neutrophils Relative %: 58 %
Other: 0 %
Platelets: 217 10*3/uL (ref 150–400)
Promyelocytes Relative: 0 %
RBC: 3.05 MIL/uL — ABNORMAL LOW (ref 4.22–5.81)
RDW: 13.8 % (ref 11.5–15.5)
WBC: 9.1 10*3/uL (ref 4.0–10.5)
nRBC: 0 % (ref 0.0–0.2)
nRBC: 0 /100 WBC

## 2020-03-28 LAB — COMPREHENSIVE METABOLIC PANEL
ALT: 22 U/L (ref 0–44)
AST: 20 U/L (ref 15–41)
Albumin: 2.8 g/dL — ABNORMAL LOW (ref 3.5–5.0)
Alkaline Phosphatase: 51 U/L (ref 38–126)
Anion gap: 9 (ref 5–15)
BUN: 26 mg/dL — ABNORMAL HIGH (ref 8–23)
CO2: 32 mmol/L (ref 22–32)
Calcium: 8.9 mg/dL (ref 8.9–10.3)
Chloride: 95 mmol/L — ABNORMAL LOW (ref 98–111)
Creatinine, Ser: 0.43 mg/dL — ABNORMAL LOW (ref 0.61–1.24)
GFR, Estimated: >60 mL/min (ref >60)
Glucose, Bld: 176 mg/dL — ABNORMAL HIGH (ref 70–99)
Potassium: 4.2 mmol/L (ref 3.5–5.1)
Sodium: 136 mmol/L (ref 135–145)
Total Bilirubin: 0.3 mg/dL (ref 0.3–1.2)
Total Protein: 7 g/dL (ref 6.5–8.1)

## 2020-03-28 LAB — CULTURE, BLOOD (SINGLE): Culture: NO GROWTH

## 2020-03-28 LAB — GLUCOSE, CAPILLARY
Glucose-Capillary: 97 mg/dL (ref 70–99)
Glucose-Capillary: 103 mg/dL — ABNORMAL HIGH (ref 70–99)
Glucose-Capillary: 103 mg/dL — ABNORMAL HIGH (ref 70–99)

## 2020-03-28 MED ORDER — SODIUM CHLORIDE 0.9% FLUSH
10.0000 mL | INTRAVENOUS | Status: DC | PRN
Start: 2020-03-28 — End: 2020-04-17
  Administered 2020-04-15: 10 mL

## 2020-03-28 MED ORDER — SODIUM CHLORIDE 0.9% FLUSH
10.0000 mL | Freq: Two times a day (BID) | INTRAVENOUS | Status: DC
  Administered 2020-03-28 (×2): 10 mL
  Administered 2020-03-29: 23:00:00 20 mL
  Administered 2020-03-29 – 2020-03-30 (×2): 10 mL
  Administered 2020-03-30 – 2020-03-31 (×2): 20 mL
  Administered 2020-03-31 – 2020-04-14 (×29): 10 mL

## 2020-03-28 NOTE — Plan of Care (Signed)

## 2020-03-28 NOTE — Progress Notes (Addendum)
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  Baseline = bed bound, able to sit on side of bed only/ wife main caretaker  Past Medical History  Sleep apnea, on CPAP at home Post Covid pneumonia pulmonary fibrosis, on mechanical ventilation Chronic respiratory failure with hypoxemia on mechanical ventilation at home Hodgkin's lymphoma Hypertension History of pancreatitis Gastroesophageal reflux disease Diabetes mellitus type 2  Significant Hospital Events   December 13 admission, chest tube placed, trache exchanged 12/15 trach changed to #8 shiley, worsening cuff leak, #6 XLT distal put back in  12/16 trach changed to #8 distal XLT, no cuff leak, removed chest tube  Consults:    Procedures:  Tracheostomy present prior to admission 9/13 right chest tube > 9/16  Significant Diagnostic Tests:  September 13 chest x-ray showed right-sided pneumothorax, chronic fibrotic changes, tracheostomy in place Bronchoscopy 12/13> no clear tracheal injury  Micro Data:  December 13 SARS-CoV-2/influenza negative December 13  MRSA PCR  Neg  December 13 respiratory culture > few gram negative rods>> diptheroids   Antimicrobials:  Ceftriaxone   12/13 > 12/17   Scheduled Meds: . chlorhexidine gluconate (MEDLINE KIT)  15 mL Mouth Rinse BID  . Chlorhexidine Gluconate Cloth  6 each Topical Daily  . enoxaparin (LOVENOX) injection  40 mg Subcutaneous Q24H  . feeding supplement (PROSource TF)  45 mL Per Tube BID  . free water  100 mL Per Tube Q4H  . insulin aspart  0-15  Units Subcutaneous TID WC  . insulin aspart  0-5 Units Subcutaneous QHS  . mouth rinse  15 mL Mouth Rinse 10 times per day  . nutrition supplement (JUVEN)  1 packet Per Tube BID BM  . pantoprazole sodium  40 mg Per Tube Daily  . sertraline  50 mg Per Tube Daily  . sodium chloride flush  10-40 mL Intracatheter Q12H   Continuous Infusions: . feeding supplement (OSMOLITE 1.2 CAL) 60 mL/hr at 03/28/20 0911  . norepinephrine (LEVOPHED) Adult infusion 2 mcg/min (03/28/20 0911)   PRN Meds:.acetaminophen (TYLENOL) oral liquid 160 mg/5 mL, albuterol, LORazepam, ondansetron (ZOFRAN) IV, sodium chloride flush     Interim history/subjective:   Comfortable on present settings/ no specific complaints    Objective   Blood pressure 91/62, pulse 77, temperature 97.8 F (36.6 C), temperature source Oral, resp. rate (!) 34, height 5' 5"  (1.651 m), weight 62.1 kg, SpO2 99 %.    Vent Mode: PCV FiO2 (%):  [40 %] 40 % Set Rate:  [16 bmp] 16 bmp PEEP:  [5 cmH20] 5 cmH20 Plateau Pressure:  [16 cmH20-24 cmH20] 16 cmH20   Intake/Output Summary (Last 24 hours) at 03/28/2020 0936 Last data filed at 03/28/2020 0911 Gross per 24 hour  Intake 2285.36 ml  Output 1350 ml  Net 935.36 ml   Filed Weights   03/26/20 0447 03/27/20 0400 03/28/20 0144  Weight: 60.2 kg 62.4 kg 62.1 kg    Examination:  Tmax  98.2  Pt alert, approp nad @ 30 degrees on  PCV of 12 cm No jvd Oropharynx clear,  mucosa nl  Neck supple trach good shape  Lungs with minimal scattered exp > insp rhonchi bilaterally RRR no s3 or or sign murmur Abd soft with nl excursion tol tf ok  Extr warm with pavalon boots  Neuro  Sensorium intact, mouths wards approp,  no apparent motor deficits    I personally reviewed images and agree with radiology impression as follows:  CXR:  Portable 12/18 No significant change in bilateral airspace disease with peripheral predominance, and mild cardiomegaly.   Resolved Hospital Problem list    Tachycardia: resolved  Assessment & Plan:  Acute on chronic respiratory failure with hypoxemia  Right pneumothorax > resolved Chronic respiratory failure dependent on home ventilator >  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 there is no cuff leak, re Maintain #8 distal XLT trach If cuff leak/air leak again then he needs to go to the OR to have exploration of tracheostomy Wean down pressure control on vent  As tol VAP prevention CXR prn  Community-acquired pneumonia: abx per flowsheet   Anxiety Ativan prn  Physical deconditioning PT/OT consult  History of lymphoma> treated in spring 2021 prior to COVID, no assessment since then Consider oncology consult  Goals of care: See note 12/14   DM2 SSI q4h    Best practice (evaluated daily)   Diet: tube feeding  Pain/Anxiety/Delirium protocol (if indicated): as above VAP protocol (if indicated): yes DVT prophylaxis: lovenox GI prophylaxis: Pantoprazole for stress ulcer prophylaxis Glucose control: as above Mobility: as tolerated though baseline = just sits on side of bed > try up in chair   last date of multidisciplinary goals of care discussion 12/14 Family and staff present spouse, [name] Summary of discussion see above Follow up goals of care discussion due 12/21 Code Status: Full Disposition: admit to ICU  Labs   CBC: Recent Labs  Lab 03/23/20 1042 03/24/20 0623 03/26/20 0407 03/27/20 0607 03/28/20 0300  WBC 15.6* 10.5 9.8 9.6 9.1  NEUTROABS 12.7*  --  5.3  --  5.3  HGB 11.0* 9.3* 9.7* 9.8* 9.0*  HCT 34.7* 29.9* 31.3* 31.2* 28.5*  MCV 93.0 93.7 92.9 93.1 93.4  PLT 219 211 232 214 532    Basic Metabolic Panel: Recent Labs  Lab 03/24/20 0623 03/25/20 0328 03/26/20 0407 03/27/20 0607 03/28/20 0300  NA 137 139 140 136 136  K 3.8 4.2 4.2 4.8 4.2  CL 97* 98 98 95* 95*  CO2 32 34* 33* 29 32  GLUCOSE 163* 160* 96 133* 176*  BUN 17 17 23 19  26*  CREATININE 0.52* 0.51* 0.41* 0.62  0.43*  CALCIUM 8.9 8.8* 9.0 8.9 8.9  MG 1.9  --   --   --   --   PHOS 3.6  --   --   --   --    GFR: Estimated Creatinine Clearance: 81.1 mL/min (A) (by C-G formula based on SCr of 0.43 mg/dL (L)). Recent Labs  Lab 03/23/20 1042 03/24/20 0623 03/26/20 0407 03/27/20 0607 03/28/20 0300  WBC 15.6* 10.5 9.8 9.6 9.1  LATICACIDVEN 1.5  --   --   --   --     Liver Function Tests: Recent Labs  Lab 03/23/20 1042 03/25/20 0328 03/26/20 0407 03/28/20 0300  AST 30 18 15 20   ALT 41 25 18 22   ALKPHOS 71 61 57 51  BILITOT 0.4 0.5 0.4 0.3  PROT 8.4* 7.3 7.4 7.0  ALBUMIN 3.4* 2.8* 3.1* 2.8*   No results for input(s): LIPASE, AMYLASE in the last 168  hours. No results for input(s): AMMONIA in the last 168 hours.  ABG    Component Value Date/Time   PHART 7.388 08/29/2019 0538   PCO2ART 70.3 (HH) 08/29/2019 0538   PO2ART 68.4 (L) 08/29/2019 0538   HCO3 52.1 (H) 09/18/2019 1025   TCO2 28 07/08/2019 1331   O2SAT 67.3 09/18/2019 1025     Coagulation Profile: Recent Labs  Lab 03/23/20 1042  INR 1.2    Cardiac Enzymes: No results for input(s): CKTOTAL, CKMB, CKMBINDEX, TROPONINI in the last 168 hours.  HbA1C: Hgb A1c MFr Bld  Date/Time Value Ref Range Status  03/23/2020 12:39 PM 5.7 (H) 4.8 - 5.6 % Final    Comment:    (NOTE) Pre diabetes:          5.7%-6.4%  Diabetes:              >6.4%  Glycemic control for   <7.0% adults with diabetes   07/09/2019 05:10 AM 7.3 (H) 4.8 - 5.6 % Final    Comment:    (NOTE) Pre diabetes:          5.7%-6.4% Diabetes:              >6.4% Glycemic control for   <7.0% adults with diabetes     CBG: Recent Labs  Lab 03/27/20 0311 03/27/20 0732 03/27/20 1132 03/27/20 1553 03/27/20 2032  GLUCAP 151* 128* 130* 120* 120*     Christinia Gully, MD [facility] [place] [phone]   After 7:00 pm call Elink  [phone]

## 2020-03-29 LAB — GLUCOSE, CAPILLARY
Glucose-Capillary: 103 mg/dL — ABNORMAL HIGH (ref 70–99)
Glucose-Capillary: 93 mg/dL (ref 70–99)
Glucose-Capillary: 123 mg/dL — ABNORMAL HIGH (ref 70–99)
Glucose-Capillary: 131 mg/dL — ABNORMAL HIGH (ref 70–99)

## 2020-03-29 NOTE — Plan of Care (Signed)

## 2020-03-30 LAB — GLUCOSE, CAPILLARY
Glucose-Capillary: 126 mg/dL — ABNORMAL HIGH (ref 70–99)
Glucose-Capillary: 108 mg/dL — ABNORMAL HIGH (ref 70–99)
Glucose-Capillary: 108 mg/dL — ABNORMAL HIGH (ref 70–99)
Glucose-Capillary: 124 mg/dL — ABNORMAL HIGH (ref 70–99)

## 2020-03-30 LAB — CREATININE, SERUM
Creatinine, Ser: 0.45 mg/dL — ABNORMAL LOW (ref 0.61–1.24)
GFR, Estimated: >60 mL/min (ref >60)

## 2020-03-30 NOTE — Progress Notes (Addendum)
PEEP 5, 40% FiO2. 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 S/p chest tube and Tracheostomy Exchange 12/13    PT Comments    Pt asking to be OOB, remains very motivated to work with PT; pt with incr effort, decr overall assist. Able to sit EOB and transition to chair today with multiple rest breaks.  Per CCM to wean pressure support today. Current vent settings at  PEEP 5, 40% FiO2. Pt with HR 90s to 128, RR 20s to 45 with activity, wanting to move but anxious--RN medicated during PT session. Will continue to follow    Follow Up Recommendations  Home health PT     Equipment Recommendations  None recommended by PT    Recommendations for Other Services       Precautions / Restrictions Precautions Precautions: Fall Precaution Comments: trach, PEG tube Required Braces or Orthoses: Other Brace Splint/Cast: prevalon boots Restrictions Weight Bearing Restrictions: No    Mobility  Bed Mobility Overal bed mobility: Needs Assistance Bed Mobility: Supine to Sit     Supine to sit: Min guard;Min assist;+2 for safety/equipment     General bed mobility comments: pt initiates assisting self, light min assist to min/guard to elevate trunk. bed pad utilized to scoot pt to EOB  Transfers Overall transfer level: Needs assistance Equipment used: 2 person hand held assist Transfers: Squat Pivot Transfers     Squat pivot transfers: Mod assist;+2 physical assistance;+2 safety/equipment;Max assist     General transfer comment: assist to elevate hips, knees blocked by PT and tech,  assist to turn/pivot to chair. pt able to WB through LEs and assist with transition  Ambulation/Gait                 Stairs             Wheelchair Mobility    Modified Rankin (Stroke Patients Only)       Balance Overall balance assessment: Needs assistance Sitting-balance support: Feet supported;Feet unsupported;No upper extremity supported Sitting balance-Leahy Scale: Fair Sitting balance - Comments: fait to good, able to minimally wt shift without LOB; remained in unsupported sitting ~ 9 minutes. cues for trunk extension and posture     Standing balance-Leahy Scale: Zero                              Cognition Arousal/Alertness: Awake/alert Behavior During Therapy: WFL for tasks assessed/performed Overall Cognitive Status: Within Functional Limits for tasks assessed                                 General Comments: patient following directions and answering questions appropriately      Exercises General Exercises - Lower Extremity Long Arc Quad: AROM;Both;5 reps;Seated    General Comments        Pertinent Vitals/Pain Pain Assessment: No/denies pain    Home Living                      Prior Function  PT Goals (current goals can now be found in the care plan section) Acute Rehab PT Goals Patient Stated Goal: to get OOB more PT Goal Formulation: With patient Time For Goal Achievement: 04/08/20 Potential to Achieve Goals: Fair Progress towards PT goals: Progressing toward goals    Frequency    Min 2X/week      PT Plan Current plan remains appropriate    Co-evaluation              AM-PAC PT "6 Clicks" Mobility   Outcome Measure  Help needed turning from your back to your side while in a flat bed without using bedrails?: A Lot Help needed moving from lying on your back to sitting on the side of a flat bed without using bedrails?: A Little Help needed moving to and from a bed to a  chair (including a wheelchair)?: A Lot Help needed standing up from a chair using your arms (e.g., wheelchair or bedside chair)?: Total Help needed to walk in hospital room?: Total Help needed climbing 3-5 steps with a railing? : Total 6 Click Score: 10    End of Session Equipment Utilized During Treatment: Gait belt Activity Tolerance: Patient tolerated treatment well Patient left: in chair;with call bell/phone within reach;with chair alarm set;Other (comment) (maxisky pad for back to bed) Nurse Communication: Mobility status PT Visit Diagnosis: Muscle weakness (generalized) (M62.81)     Time: 2353-6144 PT Time Calculation (min) (ACUTE ONLY): 30 min  Charges:  $Therapeutic Activity: 23-37 mins                     Baxter Flattery, PT  Acute Rehab Dept ([place]) [phone] Pager [phone]  03/30/2020    [facility] 03/30/2020, 11:43 AM

## 2020-03-30 NOTE — Progress Notes (Addendum)
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 emergency department complaining of 2 days of chest tightness and increased cough, found to have malpositioning of his tracheostomy tube as well as a new right-sided pneumothorax.  Baseline = bed bound, able to sit on side of bed only/ wife main caretaker  Past Medical History  Sleep apnea, on CPAP at home Post Covid pneumonia pulmonary fibrosis, on mechanical ventilation Chronic respiratory failure with hypoxemia on mechanical ventilation at home Hodgkin's lymphoma Hypertension History of pancreatitis Gastroesophageal reflux disease Diabetes mellitus type 2  Significant Hospital Events   December 13 admission, chest tube placed, trache exchanged 12/15 trach changed to #8 shiley, worsening cuff leak, #6 XLT distal put back in  12/16 trach changed to #8 distal XLT, no cuff leak, removed chest tube  Consults:    Procedures:  Tracheostomy present prior to admission 9/13 right chest tube > 9/16  Significant Diagnostic Tests:  September 13 chest x-ray showed right-sided pneumothorax, chronic fibrotic changes, tracheostomy in place Bronchoscopy 12/13> no clear tracheal injury  Micro Data:  December 13 SARS-CoV-2/influenza negative December 13  MRSA PCR  Neg  December 13 respiratory culture > few gram negative rods>> diptheroids   Antimicrobials:  Ceftriaxone   12/13 > 12/17   Interim history/subjective:   Comfortable on present settings/ no specific complaints    Objective   Blood pressure 111/74, pulse 99, temperature 98.8 F (37.1 C), temperature source Oral, resp. rate (Abnormal) 37, height 5\' 5"  (1.651 m), weight 62.1 kg, SpO2 99 %.    Vent Mode: PCV FiO2 (%):  [30 %-40 %]  40 % Set Rate:  [16 bmp] 16 bmp PEEP:  [5 cmH20] 5 cmH20 Plateau Pressure:  [21 cmH20-23 cmH20] 21 cmH20   Intake/Output Summary (Last 24 hours) at 03/30/2020 1550 Last data filed at 03/30/2020 1150 Gross per 24 hour  Intake 2448 ml  Output 1400 ml  Net 1048 ml   Filed Weights   03/27/20 0400 03/28/20 0144 03/29/20 0207  Weight: 62.4 kg 62.1 kg 62.1 kg    Examination:   General this is a debilitated 64 year old male HENT 8 xlt unremarkable Pulm dec t/o  Card RRR abd soft  Ext warm and dry  Neuro intact   Resolved Hospital Problem list   Tachycardia: resolved Right pneumothorax > resolved CAP stopped 12/17 Assessment & Plan:   Acute on chronic respiratory failure with hypoxemia  Chronic respiratory failure dependent on home ventilator >  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 there is no cuff leak Plan Cont routine trach care Cont full vent support  Cont VAP bundle Am cxr  Will place him on home vent; may be able to go home soon.   Anxiety Plan PRN ativan  Physical deconditioning Plan PT/OT   DM2 Plan ssi   History of lymphoma> treated in spring 2021 prior to COVID, no assessment since then F/u ONC as out-pt   Goals of care: See note 12/14    Best practice (evaluated daily)   Diet: tube feeding  Pain/Anxiety/Delirium protocol (if indicated): as above VAP protocol (if indicated): yes DVT prophylaxis: lovenox GI prophylaxis: Pantoprazole for stress ulcer prophylaxis Glucose control: as above Mobility: as tolerated though baseline =  just sits on side of bed > try up in chair   last date of multidisciplinary goals of care discussion 12/14 Family and staff present spouse, [name] Summary of discussion see above Follow up goals of care discussion due 12/21 Code Status: Full Disposition: admit to ICU, working on [place] ACNP-BC [place] Pager # [phone] OR # [phone] if no  answer   I, Dr. Shellee Milo, have personally reviewed patient's available data, including medical history, events of note, physical examination and test results as part of my evaluation. I have discussed with NP Kary Kos  and other care providers all aspects of the patients PCCM issues as listed.  In addition,  I have personally evaluated the patient and assisted in the formulation of the management plan.

## 2020-03-30 NOTE — Progress Notes (Signed)
Reports of svt noted at [place]: elink informed : spoke to [place] rn

## 2020-03-30 NOTE — Telephone Encounter (Signed)
I don't know what to do about this. No Door will work with him. Maybe we need to somehow set up a peer to peer or pursue some kind of appeals process? I am willing to do this if it is a possibility

## 2020-03-31 ENCOUNTER — Inpatient Hospital Stay (HOSPITAL_COMMUNITY): Payer: BC Managed Care – PPO

## 2020-03-31 DIAGNOSIS — J9620 Acute and chronic respiratory failure, unspecified whether with hypoxia or hypercapnia: Secondary | ICD-10-CM | POA: Diagnosis not present

## 2020-03-31 DIAGNOSIS — J939 Pneumothorax, unspecified: Secondary | ICD-10-CM | POA: Diagnosis not present

## 2020-03-31 DIAGNOSIS — Z930 Tracheostomy status: Secondary | ICD-10-CM | POA: Diagnosis not present

## 2020-03-31 DIAGNOSIS — Z9911 Dependence on respirator [ventilator] status: Secondary | ICD-10-CM | POA: Diagnosis not present

## 2020-03-31 LAB — CBC
HCT: 30 % — ABNORMAL LOW (ref 39.0–52.0)
Hemoglobin: 9.6 g/dL — ABNORMAL LOW (ref 13.0–17.0)
MCH: 29.7 pg (ref 26.0–34.0)
MCHC: 32 g/dL (ref 30.0–36.0)
MCV: 92.9 fL (ref 80.0–100.0)
Platelets: 229 10*3/uL (ref 150–400)
RBC: 3.23 MIL/uL — ABNORMAL LOW (ref 4.22–5.81)
RDW: 13.8 % (ref 11.5–15.5)
WBC: 9.8 10*3/uL (ref 4.0–10.5)
nRBC: 0 % (ref 0.0–0.2)

## 2020-03-31 LAB — COMPREHENSIVE METABOLIC PANEL
ALT: 21 U/L (ref 0–44)
AST: 18 U/L (ref 15–41)
Albumin: 3 g/dL — ABNORMAL LOW (ref 3.5–5.0)
Alkaline Phosphatase: 63 U/L (ref 38–126)
Anion gap: 7 (ref 5–15)
BUN: 28 mg/dL — ABNORMAL HIGH (ref 8–23)
CO2: 33 mmol/L — ABNORMAL HIGH (ref 22–32)
Calcium: 9.1 mg/dL (ref 8.9–10.3)
Chloride: 95 mmol/L — ABNORMAL LOW (ref 98–111)
Creatinine, Ser: 0.49 mg/dL — ABNORMAL LOW (ref 0.61–1.24)
GFR, Estimated: >60 mL/min (ref >60)
Glucose, Bld: 139 mg/dL — ABNORMAL HIGH (ref 70–99)
Potassium: 4.1 mmol/L (ref 3.5–5.1)
Sodium: 135 mmol/L (ref 135–145)
Total Bilirubin: 0.5 mg/dL (ref 0.3–1.2)
Total Protein: 7.6 g/dL (ref 6.5–8.1)

## 2020-03-31 LAB — GLUCOSE, CAPILLARY
Glucose-Capillary: 152 mg/dL — ABNORMAL HIGH (ref 70–99)
Glucose-Capillary: 118 mg/dL — ABNORMAL HIGH (ref 70–99)
Glucose-Capillary: 99 mg/dL (ref 70–99)
Glucose-Capillary: 106 mg/dL — ABNORMAL HIGH (ref 70–99)

## 2020-03-31 MED ORDER — PROSOURCE TF PO LIQD
45.0000 mL | Freq: Three times a day (TID) | ORAL | Status: DC
  Administered 2020-03-31 – 2020-04-17 (×50): 45 mL
  Filled 2020-03-31 (×47): qty 45

## 2020-03-31 MED ORDER — OSMOLITE 1.2 CAL PO LIQD
1000.0000 mL | ORAL | Status: DC
  Administered 2020-03-31 – 2020-04-16 (×17): 1000 mL
  Filled 2020-03-31 (×6): qty 1000

## 2020-03-31 NOTE — Progress Notes (Addendum)
Nutrition Follow-up  DOCUMENTATION CODES:   Non-severe (moderate) malnutrition in context of chronic illness  INTERVENTION:  - will adjust TF regimen: Osmolite 1.2 @ 65 ml/hr with 45 ml Prosource TF TID and 100 ml free water every 4 hours. - this regimen will provide 1992 kcal, 119 grams protein, and 1879 ml free water.   NUTRITION DIAGNOSIS:   Moderate Malnutrition related to chronic illness (lymphoma) as evidenced by moderate fat depletion,moderate muscle depletion,severe muscle depletion. 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 Patient presented to the ED due to 2-day hx of chest tightness and increased cough. He was found to have malpositioned trach and new R-sided pneumothorax.  Patient on vent via trach with PEG. He is sleeping at this time with no family/visitors present. He is receiving Osmolite 1.2 @ goal rate of 60 ml/hr with 45 ml Prosource TF BID, 1 packet juven BID, and 100 ml free water every 4 hours. This regimen is providing 1998 kcal, 107 grams protein, and 1781 ml free water.  Weight recorded as exactly the same 12/13-12/16, then up ~5 lb 12/17-12/19, and is now -3 lb compared to admission (12/13) weight. Will continue to monitor closely. Noted to have "generalized" edema.   Per notes: - bed bound at baseline - on home vent via trach - hx of lymphoma with no plan for inpatient assessment/interventions related to this   Patient is currently intubated on ventilator support MV: 14.4 L/min Temp (24hrs), Avg:98.5 F (36.9 C), Min:98 F (36.7 C), Max:98.9 F (37.2 C) Propofol: none BP: 95/56 and MAP: 68   Labs reviewed; CBG: 152 mg/dl, Cl: 95 mmol/l, BUN: 28  mg/dl, creatinine: 0.49 mg/dl. Medications reviewed; sliding scale novolog, 40 mg protonix per PEG/day.   Diet Order:   Diet Order            Diet NPO time specified  Diet effective now                 EDUCATION NEEDS:   Education needs have been addressed  Skin:  Skin Assessment: Skin Integrity Issues: Skin Integrity Issues:: Stage I Stage I: bilateral heels Unstageable: full thickness to mid coccyx  Last BM:  12/20 (type 7 x1)  Height:   Ht Readings from Last 1 Encounters:  03/23/20 5' 5"  (1.651 m)    Weight:   Wt Readings from Last 1 Encounters:  03/31/20 58.9 kg    Estimated Nutritional Needs:  Kcal:  1990-2175 kcal Protein:  105-120 grams Fluid:  >/= 2.1 L/day     Jarome Matin, MS, RD, LDN, CNSC Inpatient Clinical Dietitian RD pager # available in [place]  After hours/weekend pager # available in [facility]

## 2020-03-31 NOTE — Progress Notes (Addendum)
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  Baseline = bed bound, able to sit on side of bed only/ wife main caretaker  Past Medical History  Sleep apnea, on CPAP at home Post Covid pneumonia pulmonary fibrosis, on mechanical ventilation Chronic respiratory failure with hypoxemia on mechanical ventilation at home Hodgkin's lymphoma Hypertension History of pancreatitis Gastroesophageal reflux disease Diabetes mellitus type 2  Significant Hospital Events   December 13 admission, chest tube placed, trache exchanged 12/15 trach changed to #8 shiley, worsening cuff leak, #6 XLT distal put back in  12/16 trach changed to #8 distal XLT, no cuff leak, removed chest tube  Consults:    Procedures:  Tracheostomy present prior to admission 9/13 right chest tube > 9/16  Significant Diagnostic Tests:  September 13 chest x-ray showed right-sided pneumothorax, chronic fibrotic changes, tracheostomy in place Bronchoscopy 12/13> no clear tracheal injury  Micro Data:  December 13 SARS-CoV-2/influenza negative December 13  MRSA PCR  Neg  December 13 respiratory culture > few gram negative rods>> diptheroids   Antimicrobials:  Ceftriaxone   12/13 > 12/17   Interim history/subjective:  He is comfortable today   Objective   Blood pressure (Abnormal) 105/58, pulse 84, temperature 98 F (36.7 C), temperature source Oral, resp. rate (Abnormal) 34, height 5\' 5"  (1.651 m), weight 58.9 kg, SpO2 93 %.    Vent Mode: PCV FiO2 (%):  [30 %-40 %] 30 % Set Rate:  [16 bmp]  16 bmp PEEP:  [5 cmH20] 5 cmH20 Plateau Pressure:  [14 cmH20-21 cmH20] 17 cmH20   Intake/Output Summary (Last 24 hours) at 03/31/2020 8786 Last data filed at 03/31/2020 0600 Gross per 24 hour  Intake 3078 ml  Output 875 ml  Net 2203 ml   Filed Weights   03/28/20 0144 03/29/20 0207 03/31/20 0454  Weight: 62.1 kg 62.1 kg 58.9 kg    Examination:   General debilitated 64 year old male patient who is ventilator and tracheostomy dependent HEENT normocephalic atraumatic size 8 XLT unremarkable Pulmonary: Decreased bilaterally Cardiac: Regular rate and rhythm Abdomen: Soft nontender Strongly: Warm dry Neuro: Awake oriented no focal deficits.  Resolved Hospital Problem list   Tachycardia: resolved Right pneumothorax > resolved CAP stopped 12/17 Assessment & Plan:   Acute on chronic respiratory failure with hypoxemia  Chronic respiratory failure dependent on home ventilator >  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 there is no cuff leak Plan Continue full ventilator support Out of bed Routine trach care VAP bundle Getting home ventilator tubing to ensure he can tolerate home ventilation Awaiting chest x-ray for today 12/21  Anxiety Plan As needed Ativan  Physical deconditioning Plan Working with physical therapy  DM2 Plan Sliding scale insulin  History of lymphoma> treated in spring 2021 prior to [place], no assessment since then Can follow-up with oncology as an outpatient  Goals of care: 12/21 addressed   Best practice (evaluated daily)   Diet: tube feeding  Pain/Anxiety/Delirium protocol (if indicated): as above VAP protocol (if indicated): yes DVT prophylaxis: lovenox GI prophylaxis: Pantoprazole for stress ulcer prophylaxis Glucose  control: as above Mobility: as tolerated though baseline = just sits on side of bed > try up in chair   last date of multidisciplinary goals of care discussion 12/14 Family and staff present spouse, Dr.  [place] Bells Summary of discussion see above Follow up goals of care discussed with spouse over the phone.  She was unable to present to the hospital.  She continues to desire the patient come home on the ventilator Code Status: Full Disposition: admit to ICU, continue to work on discharge planning.  The biggest issue seems to be the patient's DME needs, as well as home health care needs.  Have asked transition of care team help with this.  As soon as we know he has available services including respiratory therapy, physical therapy, and still has his equipment at home he can be discharged  Erick Colace ACNP-BC [place] Pager # [phone] OR # [phone] if no answer

## 2020-03-31 NOTE — TOC Progression Note (Addendum)
Transition of Care [facility]) - Progression Note    Patient Details  Name: Steve [name] MRN: [id number] Date of Birth: October 14, 1955  Transition of [facility]) CM/SW Contact  Leeroy Cha, RN Phone Number: 03/31/2020, 8:30 AM  Clinical Narrative:    1206-tct-[facility] kenzie left message to call back. For hhc. tcf-[facility] and [facility] unaBLE TO [facility] home venbt. tct-corey barnett with [facility] message left to call back tct-cindy sillmon wkith baYADA FOR HHC [facility]. 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  Baseline = bed bound, able to sit on side of bed only/ wife main caretaker  Past Medical History  Sleep apnea, on CPAP at home Post Covid pneumonia pulmonary fibrosis, on mechanical ventilation Chronic respiratory failure with hypoxemia on mechanical ventilation at home Hodgkin's lymphoma Hypertension History of pancreatitis Gastroesophageal reflux disease Diabetes mellitus type 2  Significant Hospital Events   December 13 admission, chest tube placed, trache exchanged 12/15 trach changed to #8 shiley, worsening cuff leak, #6 XLT distal put back in  12/16 trach changed to #8 distal XLT, no cuff leak, removed chest tube  [id number]/remains on the vent no changes in status. Following for progression and toc needs.   Expected Discharge Plan: [place] Barriers to Discharge: No Barriers Identified  Expected Discharge Plan and Services Expected Discharge Plan: [place]   Discharge Planning Services: CM Consult   Living arrangements for the past 2 months: Mobile Home                                       Social Determinants of Health (SDOH) Interventions    Readmission Risk  Interventions Readmission Risk Prevention Plan 07/22/2019  Transportation Screening Complete  Medication Review Press photographer) Complete  Some recent data might be hidden

## 2020-03-31 NOTE — Progress Notes (Signed)
Doctor ordered to place the pt back on his home ventilator. 35 pc. 8 peep back rr 16, apnea alarm 10 sec. Pt is comfortable with a RR of 16. RT will continue to monitor

## 2020-04-01 DIAGNOSIS — Z430 Encounter for attention to tracheostomy: Secondary | ICD-10-CM

## 2020-04-01 DIAGNOSIS — J9620 Acute and chronic respiratory failure, unspecified whether with hypoxia or hypercapnia: Secondary | ICD-10-CM | POA: Diagnosis not present

## 2020-04-01 DIAGNOSIS — Z930 Tracheostomy status: Secondary | ICD-10-CM | POA: Diagnosis not present

## 2020-04-01 LAB — GLUCOSE, CAPILLARY
Glucose-Capillary: 132 mg/dL — ABNORMAL HIGH (ref 70–99)
Glucose-Capillary: 128 mg/dL — ABNORMAL HIGH (ref 70–99)
Glucose-Capillary: 135 mg/dL — ABNORMAL HIGH (ref 70–99)
Glucose-Capillary: 142 mg/dL — ABNORMAL HIGH (ref 70–99)

## 2020-04-01 MED ORDER — SODIUM CHLORIDE 3 % IN NEBU
4.0000 mL | INHALATION_SOLUTION | Freq: Every day | RESPIRATORY_TRACT | Status: AC
  Administered 2020-04-01 – 2020-04-03 (×3): 4 mL via RESPIRATORY_TRACT
  Filled 2020-04-01 (×3): qty 4

## 2020-04-01 MED ORDER — POLYETHYLENE GLYCOL 3350 17 G PO PACK
17.0000 g | PACK | Freq: Every day | ORAL | Status: DC | PRN
  Administered 2020-04-01 – 2020-04-07 (×3): 17 g
  Filled 2020-04-01 (×3): qty 1

## 2020-04-01 MED ORDER — POLYETHYLENE GLYCOL 3350 17 G PO PACK: 17.0000 g | PACK | Freq: Every day | ORAL | Status: DC | PRN

## 2020-04-01 MED ORDER — COVID-19 MRNA VACCINE (PFIZER) 30 MCG/0.3ML IM SUSP
0.3000 mL | Freq: Once | INTRAMUSCULAR | Status: AC
  Administered 2020-04-01: 16:00:00 0.3 mL via INTRAMUSCULAR
  Filled 2020-04-01: qty 0.3

## 2020-04-01 NOTE — Evaluation (Signed)
SLP Cancellation Note  Patient Details Name: Steve [name] MRN: [id number] DOB: 04-May-1955   Cancelled treatment:       Reason Eval/Treat Not Completed: Medical issues which prohibited therapy (pt with copious, viscous secretions, will defer PMSV and BSE until 04/02/2020.  Thanks.)  Kathleen Lime, MS [facility] SLP Acute Rehab Services Office [phone] Pager [phone]   [name] 04/01/2020, 2:49 PM

## 2020-04-01 NOTE — Telephone Encounter (Signed)
PCCs/Lib[place], please advise on Dr. Agustina Caroli message. Thank you.

## 2020-04-01 NOTE — Progress Notes (Signed)
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  Baseline = bed bound, able to sit on side of bed only/ wife main caretaker  Past Medical History  Sleep apnea, on CPAP at home Post Covid pneumonia pulmonary fibrosis, on mechanical ventilation Chronic respiratory failure with hypoxemia on mechanical ventilation at home Hodgkin's lymphoma Hypertension History of pancreatitis Gastroesophageal reflux disease Diabetes mellitus type 2  Significant Hospital Events   December 13 admission, chest tube placed, trache exchanged 12/15 trach changed to #8 shiley, worsening cuff leak, #6 XLT distal put back in  12/16 trach changed to #8 distal XLT, no cuff leak, removed chest tube  Consults:    Procedures:  Tracheostomy present prior to admission 9/13 right chest tube > 9/16  Significant Diagnostic Tests:  September 13 chest x-ray showed right-sided pneumothorax, chronic fibrotic changes, tracheostomy in place Bronchoscopy 12/13> no clear tracheal injury  Micro Data:  December 13 SARS-CoV-2/influenza negative December 13  MRSA PCR  Neg  December 13 respiratory culture > few gram negative rods>> diptheroids   Antimicrobials:  Ceftriaxone   12/13 > 12/17   Interim history/subjective:  Reported he had some difficulty coughing up his sputum   Objective   Blood pressure 109/66, pulse 75, temperature 98.4 F (36.9 C), temperature source Oral, resp. rate (Abnormal) 26, height 5\' 5"  (1.651 m), weight 60.3 kg, SpO2 99 %.    Vent Mode: PCV Set Rate:  [16 bmp] 16  bmp PEEP:  [8 cmH20] 8 cmH20   Intake/Output Summary (Last 24 hours) at 04/01/2020 0902 Last data filed at 04/01/2020 0726 Gross per 24 hour  Intake 900.75 ml  Output 1700 ml  Net -799.25 ml   Filed Weights   03/29/20 0207 03/31/20 0454 04/01/20 0439  Weight: 62.1 kg 58.9 kg 60.3 kg    Examination:  General 64 year old WM resting in bed. No distress HENT trach site unremarkable. NCAT MMM edentulous, does have some bloody trach secretions Pulm bilateral rales in bases Card RRR abd PEG tube intact. Significant granulation tissue surrounding the PEG tube Ext warm and dry  gu voids Neuro awake and oriented   Resolved Hospital Problem list   Tachycardia: resolved Right pneumothorax > resolved CAP stopped 12/17 Assessment & Plan:   Acute on chronic respiratory failure with hypoxemia  Chronic respiratory failure dependent on home ventilator >  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 pcxr personally reviewed: stable diffuse bilateral airspace disease   Plan Cont full vent support. Now on his home vent. We changed him to rr 16. Will need to address this re: his home care orders prior to dc Will see if he can tolerate ATC during day.  Cont OOB VAP bundle Will ask SLP to see re: possible in-line PMV.  Cont routine trach care  Add hypertonic saline neb daily   Anxiety Plan PRN ativan  Physical deconditioning Plan Cont to work w/ PT   DM2 Plan ssi   History of lymphoma> treated in spring 2021 prior to [place], no assessment since then Can follow up w/ ONC as  outpt   Severe deconditioning Plan Cont PT/OT  Dysphagia  Plan Cont routine PEG care Asking ostomy nurse to assess granulation tissue and make wound care recs  Could he possibly eat?   Goals of care: 12/21   Best practice (evaluated daily)   Diet: tube feeding -->asking SLP to eval Pain/Anxiety/Delirium protocol (if indicated): as above VAP protocol (if indicated): yes DVT  prophylaxis: lovenox GI prophylaxis: Pantoprazole for stress ulcer prophylaxis Glucose control: as above Mobility: as tolerated though baseline = just sits on side of bed > try up in chair   last date of multidisciplinary goals of care discussion 12/19 Family and staff present spouse, Kary Kos 12/19 Summary of discussion see above Follow up goals of care discussed with spouse over the phone.  She was unable to present to the hospital.  She continues to desire the patient come home on the ventilator Code Status: Full Disposition: cont ICU. Hope home soon  Erick Colace ACNP-BC [place] Pager # [phone] OR # [phone] if no answer

## 2020-04-01 NOTE — Progress Notes (Signed)
Rt placed pt on TC per NP verbal order. No distress noted at this time.

## 2020-04-01 NOTE — Consult Note (Signed)
[facility] Nurse Consult Note: Patient receiving care in [place] completed remotely after review of chart and [employer] with bedside RN Reason for Consult: granulation tissue at feeding tube site Wound type: Skin breakdown at feeding tube site MASD Pressure Injury POA: NA Drainage (amount, consistency, odor)  Periwound: Dressing procedure/placement/frequency: Gently clean the area with NS, pat dry. Wipe the area with 21M skin barrier wipes [name] # [id number]) allow to dry. May place a small piece of Xeroform gauze directly on the granulation tissue and secure with split gauze and medipore tape. Change daily.  Monitor the wound area(s) for worsening of condition such as: Signs/symptoms of infection, increase in size, development of or worsening of odor, development of pain, or increased pain at the affected locations.   Notify the medical team if any of these develop.  Thank you for the consult. [place] nurse will not follow at this time.   Please re-consult the [facility] team if needed.  Cathlean Marseilles Tamala Julian, MSN, RN, [place], Lysle Pearl, [facility] Wound Treatment Associate Pager [phone]

## 2020-04-01 NOTE — TOC Progression Note (Signed)
Transition of Care [facility]) - Progression Note    Patient Details  Name: Steve [name] MRN: [id number] Date of Birth: 1955/09/02  Transition of Care [facility]) CM/SW Contact  Leeroy Cha, RN Phone Number: 04/01/2020, 8:04 AM  Clinical Narrative:    tcf [facility] with [facility] unable to do hhc due to insurance restrictions.  Pt may need to apply for medicaid.  Home vent care is done through snf benefit p[olicy does not have this.   Expected Discharge Plan: [place] Barriers to Discharge: No Barriers Identified  Expected Discharge Plan and Services Expected Discharge Plan: [place]   Discharge Planning Services: CM Consult   Living arrangements for the past 2 months: Mobile Home                                       Social Determinants of Health (SDOH) Interventions    Readmission Risk Interventions Readmission Risk Prevention Plan 07/22/2019  Transportation Screening Complete  Medication Review Press photographer) Complete  Some recent data might be hidden

## 2020-04-01 NOTE — Progress Notes (Signed)
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 S/p chest tube and Tracheostomy Exchange 12/13    PT Comments    Patient received on his home vent. Assisted to sitting on bed edge. Patient with noted increased secretions and indicated that he could not make attempt to stand at stedy. Patient sat  On bed edge x ~ 6 minutes. RN in to suction patient.  Patient assisted back into bed. Continue progressive activity as patient tolerates.  Follow Up Recommendations  Home health PT     Equipment Recommendations  None recommended by PT    Recommendations for Other Services       Precautions / Restrictions Precautions Precautions: Fall Precaution Comments: trach, PEG tube Required Braces or Orthoses: Other Brace Splint/Cast: prevalon boots Restrictions Weight Bearing Restrictions: No    Mobility  Bed Mobility   Bed Mobility: Supine to Sit;Sit to Supine     Supine to sit: Min assist Sit to supine: Min assist   General bed mobility comments: pt initiates assisting self, light min assist to min/guard to elevate trunk. bed pad utilized to scoot pt to EOB  Transfers                 General transfer comment: patient noted with significant secretions and appeared to be in more distress. RN in to suction. Assited back into bed.  Ambulation/Gait                 Stairs             Wheelchair Mobility    Modified Rankin (Stroke Patients Only)       Balance Overall balance assessment: Needs assistance Sitting-balance support: Feet supported;Feet  unsupported;No upper extremity supported Sitting balance-Leahy Scale: Fair                                      Cognition Arousal/Alertness: Awake/alert Behavior During Therapy: WFL for tasks assessed/performed;Anxious Overall Cognitive Status: Within Functional Limits for tasks assessed                                        Exercises      General Comments        Pertinent Vitals/Pain Faces Pain Scale: No hurt    Home Living                      Prior Function            PT Goals (current goals can now be found in the care plan section) Progress towards PT goals: Progressing toward goals    Frequency    Min 2X/week      PT Plan Current plan remains appropriate    Co-evaluation PT/OT/SLP Co-Evaluation/Treatment: Yes Reason for Co-Treatment: Complexity of the patient's impairments (multi-system involvement);For patient/therapist safety          AM-PAC PT "6 Clicks" Mobility   Outcome Measure  Help needed  turning from your back to your side while in a flat bed without using bedrails?: A Lot Help needed moving from lying on your back to sitting on the side of a flat bed without using bedrails?: A Lot Help needed moving to and from a bed to a chair (including a wheelchair)?: A Lot Help needed standing up from a chair using your arms (e.g., wheelchair or bedside chair)?: Total Help needed to walk in hospital room?: Total Help needed climbing 3-5 steps with a railing? : Total 6 Click Score: 9    End of Session Equipment Utilized During Treatment: Gait belt Activity Tolerance: Treatment limited secondary to medical complications (Comment) Patient left: in bed;with call bell/phone within reach Nurse Communication: Mobility status PT Visit Diagnosis: Muscle weakness (generalized) (M62.81)     Time: 7948-0165 PT Time Calculation (min) (ACUTE ONLY): 34 min  Charges:  $Therapeutic Activity: 8-22 mins                      [place] Pager [phone] Office [phone]    Claretha Cooper 04/01/2020, 9:58 AM

## 2020-04-01 NOTE — Progress Notes (Signed)
Occupational Therapy Progress Note  Today's session focus on functional transfer and OOB activity tolerance. Patient require min A with bed mobility, however once seated EOB patient with increased secretions and increase respiratory discomfort/coughing O2 sats reading 88%. RN notified for suctioning. Ultimately patient fatigued and returned to semi-supine, unable to tolerate transfer to chair during session however patient remains highly motivated to participate with therapy.    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 S/p chest tube and Tracheostomy Exchange 12/13  Precautions  Precautions Fall  Precaution Comments trach, PEG tube  Required Braces or Orthoses Other Brace  Splint/Cast prevalon boots  Pain Assessment  Pain Assessment Faces  Faces Pain Scale 0  Cognition  Arousal/Alertness Awake/alert  Behavior During Therapy WFL for tasks assessed/performed;Anxious  Overall Cognitive Status Within Functional Limits for tasks assessed  Bed Mobility  Overal bed mobility Needs Assistance  Bed Mobility Supine to Sit;Sit to Supine  Supine to sit Min assist  Sit to supine Min assist  General bed mobility comments pt initiates assisting self, light min assist to min/guard to elevate trunk. bed pad utilized to scoot pt to EOB  Balance  Overall balance assessment Needs  assistance  Sitting-balance support Feet supported;Feet unsupported;No upper extremity supported  Sitting balance-Leahy Scale Fair  Transfers  Overall transfer level Needs assistance  General transfer comment attempt transfer using stedy however once EOB patient with increased secretions and appeared to be in more distressed with RN notified to suction. patient ultimately returned to semi-supine  OT - End of Session  Activity Tolerance Treatment limited secondary to medical complications (Comment) (increased secretions)  Patient left in bed;with call bell/phone within reach  Nurse Communication Mobility status  OT Assessment/Plan  OT Plan Discharge plan remains appropriate  OT Visit Diagnosis Other abnormalities of gait and mobility (R26.89)  OT Frequency (ACUTE ONLY) Min 2X/week  Follow Up Recommendations Home health OT;Supervision/Assistance - 24 hour  OT Equipment 3 in 1 bedside commode;Other (comment) (pending patient progress)  AM-PAC OT "6 Clicks" Daily Activity Outcome Measure (Version 2)  Help from another person eating meals? 1 (peg tube)  Help from another person taking care of personal grooming? 3  Help from another person toileting, which includes using toliet, bedpan, or urinal? 1  Help from another person bathing (including washing, rinsing, drying)? 2  Help from another person to put on and taking off regular upper body clothing? 2  Help from another person to put on and taking off regular lower body clothing? 1  6 Click Score 10  OT Goal Progression  Progress towards OT goals Progressing toward goals  Acute Rehab OT Goals  Patient Stated Goal to get OOB more  OT Goal Formulation With patient  Time For Goal Achievement 04/08/20  Potential to Achieve Goals Good  ADL Goals  Additional ADL Goal #1 Patient will require mod A for bed mobility in preparation for self care tasks.  Additional ADL Goal #2 Patient will sit up  EOB at supervision level for 6 minutes in order to  participate in self care tasks.  Additional ADL Goal #3 Patient will wash UB seated EOB at supervision level in order to reduce caregiver burden.  OT Time Calculation  OT Start Time (ACUTE ONLY) 0835  OT Stop Time (ACUTE ONLY) 0905  OT Time Calculation (min) 30 min  OT General Charges  $OT Visit 1 Visit  OT Treatments  $Self Care/Home Management  8-22 mins   [name] OT OT pager: [phone]

## 2020-04-01 NOTE — Progress Notes (Signed)
Upon changing patient's PEG dressing per orders, this RN noticed a significant amount of granulation tissue surrounding tube. CCM also assessed site at bedside. This RN will continue to monitor.

## 2020-04-01 NOTE — TOC Progression Note (Signed)
Transition of Care [facility]) - Progression Note    Patient Details  Name: Steve [name] MRN: [id number] Date of Birth: 1955-11-05  [facility] CM/SW Contact  Purcell Mouton, RN Phone Number: 04/01/2020, 9:40 AM  Clinical Narrative:    Damaris Schooner with [employer] rep, Pt's insurance will not cover Home Health RN(HHRN). Pt will need to pay out of pocket for that service. There is a minimum four hours for the [facility].    Expected Discharge Plan: [place] Barriers to Discharge: No Barriers Identified  Expected Discharge Plan and Services Expected Discharge Plan: [place]   Discharge Planning Services: CM Consult   Living arrangements for the past 2 months: Mobile Home                                       Social Determinants of Health (SDOH) Interventions    Readmission Risk Interventions Readmission Risk Prevention Plan 07/22/2019  Transportation Screening Complete  Medication Review Press photographer) Complete  Some recent data might be hidden

## 2020-04-01 NOTE — Plan of Care (Signed)
RN will continue to monitor patient's progression of care plan.  

## 2020-04-02 DIAGNOSIS — J9620 Acute and chronic respiratory failure, unspecified whether with hypoxia or hypercapnia: Secondary | ICD-10-CM | POA: Diagnosis not present

## 2020-04-02 DIAGNOSIS — Z430 Encounter for attention to tracheostomy: Secondary | ICD-10-CM

## 2020-04-02 DIAGNOSIS — Z9911 Dependence on respirator [ventilator] status: Secondary | ICD-10-CM | POA: Diagnosis not present

## 2020-04-02 LAB — GLUCOSE, CAPILLARY
Glucose-Capillary: 144 mg/dL — ABNORMAL HIGH (ref 70–99)
Glucose-Capillary: 110 mg/dL — ABNORMAL HIGH (ref 70–99)
Glucose-Capillary: 142 mg/dL — ABNORMAL HIGH (ref 70–99)
Glucose-Capillary: 117 mg/dL — ABNORMAL HIGH (ref 70–99)
Glucose-Capillary: 108 mg/dL — ABNORMAL HIGH (ref 70–99)
Glucose-Capillary: 127 mg/dL — ABNORMAL HIGH (ref 70–99)
Glucose-Capillary: 127 mg/dL — ABNORMAL HIGH (ref 70–99)

## 2020-04-02 NOTE — Progress Notes (Signed)
Pt took off home vent and placed on 35%TC.

## 2020-04-02 NOTE — Progress Notes (Signed)
   TC from [place] CM at [facility] to review chart and see if pt would be appropriate for hospice care.   I reviewed the chart and spoke to my MD about the pt. It is an unfortunate situation and he is very very sick. However, his goals do not line up with hospice and comfort approach. We would not be able to serve this pt due to this. Pt and his wife both appear to want a Full scope of care at this time.  Webb Silversmith RN [phone]

## 2020-04-02 NOTE — Evaluation (Signed)
[facility] Speaking Valve - Evaluation Patient Details  Name: Steve [name] MRN: [id number] Date of Birth: 03-13-1956  Today's Date: 04/02/2020 Time: 4315-4008 SLP Time Calculation (min) (ACUTE ONLY): 30 min  Past Medical History:  Past Medical History:  Diagnosis Date  . Abscess of muscle 08/10/2011   staph infection of right hip   . Acute on chronic respiratory failure with hypoxia ([place])   . Acute respiratory distress syndrome (ARDS) due to COVID-19 virus ([place])   . COVID-19 virus infection   . Diabetes mellitus without complication ([place])   . GERD (gastroesophageal reflux disease)    rare reflux - no meds for reflux - NO PROBLEM IN PAST SEVERAL YRS  . HCAP (healthcare-associated pneumonia)   . Healthcare-associated pneumonia   . Hip dysplasia, congenital    no surgery as a child for hip dysplasia - has had bilateral hip replacements as an adult  . Hodgkin lymphoma ([place])   . Hodgkin's lymphoma ([place])   . Hypertension   . Pancreatitis   . Pneumothorax, acute   . Postoperative anemia due to acute blood loss 09/07/2012  . Septic arthritis of hip ([place]) 09/05/2012   PT'S TOTAL HIP JOINT REMOVED - ANTIBIOTIC SPACE PLACED AND PT HAS FINISHED IV ANTIBIOTICS ( PICC LINE REMOVED)  . Sleep apnea    USES CPAP   Past Surgical History:  Past Surgical History:  Procedure Laterality Date  . COLONOSCOPY  04/26/2007  . HERNIA REPAIR     inguinal hernia x3  . IR GASTROSTOMY TUBE MOD SED  08/22/2019  . IR IMAGING GUIDED PORT INSERTION  03/29/2019  . JOINT REPLACEMENT  2002 & 2007   bilateral hip replacement  . LYMPH NODE BIOPSY Right 03/20/2019   Procedure: DEEP RIGHT SUPRACLAVICULAR LYMPH NODE EXCISION;  Surgeon: Fanny Skates, MD;  Location: [facility];  Service: General;  Laterality: Right;  . MULTIPLE EXTRACTIONS WITH ALVEOLOPLASTY  07/28/2011   Procedure: MULTIPLE EXTRACION WITH ALVEOLOPLASTY;  Surgeon: Lenn Cal, DDS;  Location: [facility];  Service: Oral Surgery;   Laterality: N/A;  Extraction of tooth #'s 2,3,4,5,6,11,12,13,15,19,22 with alveoloplasty.  . shoulder repair - right for separation of shoulder    . TEE WITHOUT CARDIOVERSION  07/29/2011   Procedure: TRANSESOPHAGEAL ECHOCARDIOGRAM (TEE);  Surgeon: Josue Hector, MD;  Location: [facility];  Service: Cardiovascular;  Laterality: N/A;  . TOTAL HIP REVISION Right 09/05/2012   Procedure: RIGHT HIP RESECTION ARTHROPLASTY WITH ANTIBIOTIC SPACERS;  Surgeon: [name], MD;  Location: [facility];  Service: Orthopedics;  Laterality: Right;  . 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 S/p chest tube and Tracheostomy Exchange 12/13.  PEG.  No record of prior swallow or PMV evaluations. Pt originally had trach placed in April 2021 after admission for severe acute respiratory failure with hypoxemia in the setting of receiving chemotherapy for Hodgkin's lymphoma.   Assessment / Plan / Recommendation Clinical Impression  Pt participated in PMV assessment while on trach collar trials today.  Baseline VS: HR 87, Sp02 100%, RR 19. Despite large trach -#8 xl -  pt tolerated PMV well.  He has large stoma, leading to significant airleak around base of trach.  [name], NP, was present to apply mepilex foam, which helped but manual pressure at base of trach was necessary to occlude stoma and allow air to flow through upper airway/larynx to achieve voice.  Voice quality was hoarse but improved during session.  Sp02/HR remained stable; RR increased to 30s-40s - D/W  [name], who recommended continuing efforts with valve as long as pt is comfortable. There was adequate access to upper airway. Intermittent removal of valve, even when stoma occluded around base of trach, did not lead to back flow of air through trach.  Pt was enthusiastic and appropriately emotional given that he has not been able to speak since April of this year. During the session, he called and spoke with his wife, his sister, and his daughters.  Recommend that pt use PMV whenever staff is present or when they can provide close supervision. He should wear valve as much as possible.  SLP will follow for communication and will pursue swallow assessment when appropriate. SLP Visit Diagnosis: Aphonia (R49.1)    SLP Assessment  Patient needs continued Speech Lanaguage Pathology Services    Follow Up Recommendations  Other (comment) (tba)    Frequency and Duration min 3x week  2 weeks    PMSV Trial PMSV was placed for: 30 minutes Able to redirect subglottic air through upper airway: Yes Able to Attain Phonation: Yes Voice Quality: Hoarse Able to Expectorate Secretions: No attempts Level of Secretion Expectoration with PMSV: Not observed Breath Support for Phonation: Adequate Intelligibility: Intelligible Respirations During Trial:  (range 30-40) SpO2 During Trial: 96 % Behavior: Alert;Controlled   Tracheostomy Tube  Additional Tracheostomy Tube Assessment Fenestrated: No    Vent Dependency  Vent Dependent:  (on trach collar) FiO2 (%): 35 %    Cuff Deflation Trial Belkis Norbeck L. Tivis Ringer, MA CCC/SLP Acute Rehabilitation Services Office number [phone] Pager [phone]  Tolerated Cuff Deflation: Yes        Juan Quam [facility] 04/02/2020, 12:34 PM

## 2020-04-02 NOTE — TOC Progression Note (Addendum)
Transition of Care [facility]) - Progression Note    Patient Details  Name: RENLY ROOTS MRN: [id number] Date of Birth: 1955-06-19  Transition [facility]) CM/SW Contact  Purcell Mouton, RN Phone Number: 04/02/2020, 10:27 AM  Clinical Narrative:     Spoke with pt's wife on yesterday for a length of time concerning discharge plans. Also, spoke with different [place] to follow pt at home. The only agency that will service this pt at home is [facility]. With a co-pay around $52.00/hour and  a minimum of 4 hours any day of service. Pt was with [facility] at home, however he no longer need Hospice Care. Before going home with [facility] pt was at [facility]. Spoke with pt's wife [name] [phone] concerning pt going back to [facility] and the benefits, she did not want pt to go back related to him not getting PT during his stay there.  [name] asked to check to see if [facility] would offer pt a bed. Explained that since this is Holiday day week, insurance company may not give authorization until Monday. This CM has already reached out to Both [facility], spoke with Anderson Malta at [employer]	[place]. A call was made to  Santiago Glad, admission coordinator at [facility].  Pt's information was faxed to Santiago Glad at [place] [phone].   Expected Discharge Plan: [place] Barriers to Discharge: No Barriers Identified  Expected Discharge Plan and Services Expected Discharge Plan: [place]   Discharge Planning Services: CM Consult   Living arrangements for the past 2 months: Mobile Home                                       Social Determinants of Health (SDOH) Interventions    Readmission Risk Interventions Readmission Risk Prevention Plan 07/22/2019  Transportation Screening Complete  Medication Review Press photographer) Complete  Some recent data might be hidden

## 2020-04-02 NOTE — Progress Notes (Addendum)
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  Baseline = bed bound, able to sit on side of bed only/ wife main caretaker  Past Medical History  Sleep apnea, on CPAP at home Post Covid pneumonia pulmonary fibrosis, on mechanical ventilation Chronic respiratory failure with hypoxemia on mechanical ventilation at home Hodgkin's lymphoma Hypertension History of pancreatitis Gastroesophageal reflux disease Diabetes mellitus type 2  Significant Hospital Events   December 13 admission, chest tube placed, trache exchanged 12/15 trach changed to #8 shiley, worsening cuff leak, #6 XLT distal put back in  12/16 trach changed to #8 distal XLT, no cuff leak, removed chest tube 12/20 placed on home vent 12/22 started ATC trials. Did send sputum as had some blood tinge. Having trouble getting home health assistance. 12/23 working on [employer]. Working on [employer]. Plan to change vent to PRN.  Consults:    Procedures:  Tracheostomy present prior to admission 9/13 right chest tube > 9/16  Significant Diagnostic Tests:  September 13 chest x-ray showed right-sided pneumothorax, chronic fibrotic changes, tracheostomy in place Bronchoscopy 12/13> no clear tracheal injury  Micro Data:  December 13 SARS-CoV-2/influenza negative December 13  MRSA PCR  Neg  December 13 respiratory culture > few gram negative rods>> diptheroids   Antimicrobials:  Ceftriaxone   12/13 > 12/17   Interim history/subjective:  Feels well    Objective   Blood pressure 101/62, pulse 78, temperature 98.6  F (37 C), temperature source Oral, resp. rate 14, height 5\' 5"  (1.651 m), weight 60.3 kg, SpO2 100 %.    Vent Mode: PCV FiO2 (%):  [35 %-40 %] 35 % Set Rate:  [16 bmp] 16 bmp PEEP:  [8 cmH20] 8 cmH20   Intake/Output Summary (Last 24 hours) at 04/02/2020 1001 Last data filed at 04/02/2020 0800 Gross per 24 hour  Intake 3290.58 ml  Output 1305 ml  Net 1985.58 ml   Filed Weights   03/29/20 0207 03/31/20 0454 04/01/20 0439  Weight: 62.1 kg 58.9 kg 60.3 kg    Examination:  General this is a 64 year old white male he is resting in bed and in no distress HENT 8 cuffed distal xlt trach. Cuff currently deflated. Able to phonate some Some blood tinged secretions but cough mechanics are excellent Pulm dec t/o but no accessory use. Has some scattered rhonchi w/ cough Card RRR abd PEG unremarkable  Ext still weak but improving.  Neuro intact   Resolved Hospital Problem list   Tachycardia: resolved Right pneumothorax > resolved CAP stopped 12/17 Assessment & Plan:   Acute on chronic respiratory failure with hypoxemia  Chronic respiratory failure dependent on home ventilator >  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 Has some blood tinged sputum but otherwise doing well.  Sputum sent  Plan Cont ATC as tolerated  Keep back up vent at bedside F/u sputum culture OOB Cont HT neb daily SLP eval-->he should be able to tolerate PMV Cont routine trach care.  If he does well off vent we could down size just to 6 shiley; this would help  him w/ his PMV as I suspect that he will trap some air w/ 8 distal XLT Needs his second covid vax after 1/12  Anxiety Plan PRN ativan  DM2 Plan ssi   History of lymphoma> treated in spring 2021 prior to [place], no assessment since then ONC as out-pt   Severe deconditioning Plan Cont PT/OT  Dysphagia  Plan Cont PEG feeds Cont routine ostomy care SLP to eval swallowing      Best practice (evaluated daily)   Diet: tube  feeding -->asking SLP to eval Pain/Anxiety/Delirium protocol (if indicated): as above VAP protocol (if indicated): yes DVT prophylaxis: lovenox GI prophylaxis: Pantoprazole for stress ulcer prophylaxis Glucose control: as above Mobility: as tolerated though baseline = just sits on side of bed > try up in chair   last date of multidisciplinary goals of care discussion 12/19 Family and staff present spouse, Kary Kos 12/19 Summary of discussion see above Follow up goals of care discussed with spouse over the phone.  She was unable to present to the hospital.  She continues to desire the patient come home Code Status: Full Disposition: cont ICU. Hope home soon  Erick Colace ACNP-BC [place] Pager # [phone] OR # [phone] if no answer

## 2020-04-02 NOTE — Progress Notes (Signed)
Around 1415 RN was called by tele d/t an 8 beat run of SVT lasting <3 secs. CCM notified by this RN. Per CCM, will recheck BMP and mag in AM. This RN will continue to carefully monitor.

## 2020-04-03 DIAGNOSIS — J9621 Acute and chronic respiratory failure with hypoxia: Secondary | ICD-10-CM | POA: Diagnosis not present

## 2020-04-03 LAB — GLUCOSE, CAPILLARY
Glucose-Capillary: 116 mg/dL — ABNORMAL HIGH (ref 70–99)
Glucose-Capillary: 116 mg/dL — ABNORMAL HIGH (ref 70–99)
Glucose-Capillary: 149 mg/dL — ABNORMAL HIGH (ref 70–99)
Glucose-Capillary: 165 mg/dL — ABNORMAL HIGH (ref 70–99)

## 2020-04-03 LAB — COMPREHENSIVE METABOLIC PANEL
ALT: 21 U/L (ref 0–44)
AST: 19 U/L (ref 15–41)
Albumin: 3 g/dL — ABNORMAL LOW (ref 3.5–5.0)
Alkaline Phosphatase: 64 U/L (ref 38–126)
Anion gap: 9 (ref 5–15)
BUN: 24 mg/dL — ABNORMAL HIGH (ref 8–23)
CO2: 32 mmol/L (ref 22–32)
Calcium: 9 mg/dL (ref 8.9–10.3)
Chloride: 96 mmol/L — ABNORMAL LOW (ref 98–111)
Creatinine, Ser: 0.5 mg/dL — ABNORMAL LOW (ref 0.61–1.24)
GFR, Estimated: >60 mL/min (ref >60)
Glucose, Bld: 136 mg/dL — ABNORMAL HIGH (ref 70–99)
Potassium: 4.3 mmol/L (ref 3.5–5.1)
Sodium: 137 mmol/L (ref 135–145)
Total Bilirubin: 0.3 mg/dL (ref 0.3–1.2)
Total Protein: 7.9 g/dL (ref 6.5–8.1)

## 2020-04-03 LAB — MAGNESIUM: Magnesium: 2 mg/dL (ref 1.7–2.4)

## 2020-04-03 NOTE — Progress Notes (Signed)
  Speech Language Pathology Treatment: Steve [name] Speaking valve  Patient Details Name: Steve [name] MRN: [id number] DOB: 1955-11-12 Today's Date: 04/03/2020 Time: 4098-1191 SLP Time Calculation (min) (ACUTE ONLY): 23 min  Assessment / Plan / Recommendation Clinical Impression  F/u for PMV.  Pt with improved phonation with valve in place today - he self-occluded base of trach at stoma site to prevent air leak, which improved quality and volume.  Sp02 dipped into mid 80s several times, then returned to low 90s.  RR ranged 27-38. Pt was comfortable, communicative and interactive.  Used valve for approximately 25 minutes before he reported being tired and valve was removed.  Encouraged pt to ask staff to place valve when they are present or within close range.  We discussed examining swallow function next week. Steve [name] is enthusiastic about his progress.  SLP will continue to follow.   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 S/p chest tube and Tracheostomy Exchange 12/13.  PEG.  No record of prior swallow or PMV evaluations. Pt originally had trach placed in April 2021 after admission for severe acute respiratory failure with hypoxemia in the setting of receiving chemotherapy for Hodgkin's lymphoma.      SLP Plan  Continue with current plan of care       Recommendations         Patient may use Passy-Muir Speech Valve:  (when supervised by staff) PMSV Supervision: Full         Follow up Recommendations: Other (comment) (tba) Plan: Continue with current plan of care       [place]. Steve [name], [place] CCC/SLP Acute Rehabilitation Services Office number [phone] Pager  [phone]  Steve [name] 04/03/2020, 3:30 PM

## 2020-04-03 NOTE — Progress Notes (Signed)
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  Baseline = bed bound, able to sit on side of bed only/ wife main caretaker  Past Medical History  Sleep apnea, on CPAP at home Post Covid pneumonia pulmonary fibrosis, on mechanical ventilation Chronic respiratory failure with hypoxemia on mechanical ventilation at home Hodgkin's lymphoma Hypertension History of pancreatitis Gastroesophageal reflux disease Diabetes mellitus type 2  Significant Hospital Events   December 13 admission, chest tube placed, trache exchanged 12/15 trach changed to #8 shiley, worsening cuff leak, #6 XLT distal put back in  12/16 trach changed to #8 distal XLT, no cuff leak, removed chest tube 12/20 placed on home vent 12/22 started ATC trials. Did send sputum as had some blood tinge. Having trouble getting home health assistance. 12/23 working on [employer]. Working on [employer]. Plan to change vent to PRN.  Consults:    Procedures:  Tracheostomy present prior to admission 9/13 right chest tube > 9/16  Significant Diagnostic Tests:  September 13 chest x-ray showed right-sided pneumothorax, chronic fibrotic changes, tracheostomy in place Bronchoscopy 12/13> no clear tracheal injury  Micro Data:  December 13 SARS-CoV-2/influenza negative December 13  MRSA PCR  Neg  December 13 respiratory culture > few gram negative rods>> diptheroids   Antimicrobials:  Ceftriaxone   12/13 > 12/17   Interim history/subjective:  Did great overnight without vent. Used PMV yesterday!  Objective   Blood  pressure 109/65, pulse 88, temperature 98.7 F (37.1 C), temperature source Oral, resp. rate (!) 23, height 5\' 5"  (1.651 m), weight 60.3 kg, SpO2 98 %.    FiO2 (%):  [35 %] 35 %   Intake/Output Summary (Last 24 hours) at 04/03/2020 0800 Last data filed at 04/03/2020 0400 Gross per 24 hour  Intake 790 ml  Output 1275 ml  Net -485 ml   Filed Weights   03/29/20 0207 03/31/20 0454 04/01/20 0439  Weight: 62.1 kg 58.9 kg 60.3 kg    Examination: Constitutional: thin man in NAD  Eyes: EOMI, pupils equal Ears, nose, mouth, and throat: trach in place, balloon down, small thick secretions Cardiovascular: RRR, ext warm Respiratory: scattered crackles, no accessory muscle use Gastrointestinal: Soft, +BS, PEG in place Skin: No rashes, normal turgor Neurologic: moves all 4 ext to command Psychiatric: [place] Hospital Problem list   Tachycardia: resolved Right pneumothorax > resolved CAP stopped 12/17 Assessment & Plan:   Acute on chronic respiratory failure with hypoxemia  Chronic respiratory failure dependent on home ventilator >  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 -1 more day with 8 cuffed distal deflated ATC, if stable (can handle secretions) by tomorrow would switch to distal #6 cuffless to help with PMV use -Progressive mobility as able -Needs his second covid vax after 1/12  Anxiety - PRN ativan  DM2 - ssi   History of lymphoma> treated in spring 2021 prior to [place], no assessment since then - ONC as out-pt   Severe deconditioning -Cont PT/OT, rec'd for [facility]  Dysphagia  -Cont PEG feeds -Cont routine ostomy care  Disposition-  hopefully home with supplemental O2 and no further need for vent; need to determine if he can tolerate PO with downsized trach   Best practice (evaluated daily)   Diet: tube feeding -->asking SLP to eval Pain/Anxiety/Delirium protocol (if indicated): as above VAP protocol (if indicated): yes DVT prophylaxis:  lovenox GI prophylaxis: Pantoprazole for stress ulcer prophylaxis Glucose control: as above Mobility: keep working toward OOB to chair last date of multidisciplinary goals of care discussion 12/19 Family and staff present spouse, Kary Kos 12/19 Summary of discussion see above Follow up goals of care discussed with spouse over the phone.  She was unable to present to the hospital.  She continues to desire the patient come home Code Status: Full Disposition: hopefully working toward home soon once home trach size and supplies needed can be found, my goal for this would be around 12/27 due to holidays  Erskine Emery MD PCCM

## 2020-04-03 NOTE — Plan of Care (Signed)
  Problem: Clinical Measurements: °Goal: Ability to maintain clinical measurements within normal limits will improve °Outcome: Progressing °Goal: Will remain free from infection °Outcome: Progressing °Goal: Respiratory complications will improve °Outcome: Progressing °  °Problem: Nutrition: °Goal: Adequate nutrition will be maintained °Outcome: Progressing °  °

## 2020-04-03 NOTE — Progress Notes (Signed)
While making round on unit Chaplain checked in w/ charge nurse for a referral of any pt who might like have the Christmas Story read to them.  Steve [name] was one of the referral.  Pt was unable to speak but nodded his assent to have story read.  Chaplain read Christmas story.  Steve [name] eyes were clear and bright and he listened intently.  Pt silently wished Chaplain a  "Merry Christmas!"  [name]

## 2020-04-03 NOTE — Progress Notes (Signed)
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 S/p chest tube and Tracheostomy Exchange 12/13. Patient on trach collar/vent PRN.    PT Comments    The  Patient resting in bed on 28% TC, SPO2 95%. Patient is very pleased with his progress in mobility and ability to vocalize with PMV.Patient not  currently wearing PMV for mobility.   Patient  Able to move  both legs to bed edge and slowly push himself to upright. Patient scooted  To bed  Edge using UE's. Patient stood from Bed with max of  2 persons arm hold and took small steps to recliner. Noted RR 45, SPO2 85%, HR 101. Patient sat quietly, closed his eyes and concentrated on slowing  Down breaths. SPo2 back to >90% within ~ 1 minute. RR down to 32. Patient is  Very motivated to progress mobility. Patient Mouthed "I want to stand on my own next time. "  Will use EVA plaltform Rw and have patient's family bring in a pair of sneakers. Continue progressive standing and transfers with goal of  Short ambulation. Patient has been bedbound x 7 months so is pleased to be getting OOB.  Follow Up Recommendations  CIR     Equipment Recommendations  Wheelchair (measurements PT);Wheelchair cushion (measurements PT)    Recommendations for Other Services       Precautions / Restrictions Precautions Precautions: Fall Precaution Comments: trach, PEG tube Required Braces or Orthoses: Other Brace Splint/Cast: prevalon boots    Mobility  Bed Mobility   Bed Mobility: Supine to Sit     Supine to sit: Min assist     General bed mobility  comments: pt initiates assisting self, light min assist to min/guard to elevate trunk. Patient able to scoot self to bed edge.  Transfers Overall transfer level: Needs assistance Equipment used: 2 person hand held assist Transfers: Stand Pivot Transfers   Stand pivot transfers: max assist Squat pivot transfers: Max;+2 physical assistance;+2 safety/equipment     General transfer comment: patient able to stnad and take small steps to recliner.  Ambulation/Gait                 Stairs             Wheelchair Mobility    Modified Rankin (Stroke Patients Only)       Balance     Sitting balance-Leahy Scale: Fair Sitting balance - Comments: supports self with UE's.   Standing balance support: Bilateral upper extremity supported Standing balance-Leahy Scale: Poor Standing balance comment: reliant on support                            Cognition Arousal/Alertness: Awake/alert Behavior During Therapy: WFL for tasks assessed/performed Overall Cognitive Status: Within Functional Limits for tasks assessed                                        Exercises      General Comments  Pertinent Vitals/Pain      Home Living                      Prior Function            PT Goals (current goals can now be found in the care plan section) Progress towards PT goals: Progressing toward goals    Frequency    Min 3X/week      PT Plan Current plan remains appropriate;Frequency needs to be updated    Co-evaluation              AM-PAC PT "6 Clicks" Mobility   Outcome Measure  Help needed turning from your back to your side while in a flat bed without using bedrails?: A Little Help needed moving from lying on your back to sitting on the side of a flat bed without using bedrails?: A Lot Help needed moving to and from a bed to a chair (including a wheelchair)?: A Lot Help needed standing up from a chair using your  arms (e.g., wheelchair or bedside chair)?: A Lot Help needed to walk in hospital room?: Total Help needed climbing 3-5 steps with a railing? : Total 6 Click Score: 11    End of Session Equipment Utilized During Treatment: Gait belt Activity Tolerance: Patient tolerated treatment well Patient left: in chair;with call bell/phone within reach Nurse Communication: Mobility status PT Visit Diagnosis: Muscle weakness (generalized) (M62.81)     Time: 6415-8309 PT Time Calculation (min) (ACUTE ONLY): 25 min  Charges:  $Therapeutic Activity: 23-37 mins                     [place] Pager [phone] Office [phone]    Claretha Cooper 04/03/2020, 3:54 PM

## 2020-04-03 NOTE — Progress Notes (Signed)
Pts. Home VENT remains @ bedside along with Servo-I if needed, RT to monitor.

## 2020-04-04 DIAGNOSIS — J9621 Acute and chronic respiratory failure with hypoxia: Secondary | ICD-10-CM | POA: Diagnosis not present

## 2020-04-04 DIAGNOSIS — Z930 Tracheostomy status: Secondary | ICD-10-CM | POA: Diagnosis not present

## 2020-04-04 LAB — CULTURE, RESPIRATORY W GRAM STAIN

## 2020-04-04 LAB — GLUCOSE, CAPILLARY
Glucose-Capillary: 156 mg/dL — ABNORMAL HIGH (ref 70–99)
Glucose-Capillary: 93 mg/dL (ref 70–99)
Glucose-Capillary: 87 mg/dL (ref 70–99)
Glucose-Capillary: 156 mg/dL — ABNORMAL HIGH (ref 70–99)

## 2020-04-04 MED ORDER — LEVOFLOXACIN 750 MG PO TABS
750.0000 mg | ORAL_TABLET | Freq: Every day | ORAL | Status: DC
  Administered 2020-04-04 – 2020-04-05 (×2): 750 mg
  Filled 2020-04-04 (×2): qty 1

## 2020-04-04 NOTE — Progress Notes (Signed)
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  Baseline = bed bound, able to sit on side of bed only/ wife main caretaker  Past Medical History  Sleep apnea, on CPAP at home Post Covid pneumonia pulmonary fibrosis, on mechanical ventilation Chronic respiratory failure with hypoxemia on mechanical ventilation at home Hodgkin's lymphoma Hypertension History of pancreatitis Gastroesophageal reflux disease Diabetes mellitus type 2  Significant Hospital Events   December 13 admission, chest tube placed, trache exchanged 12/15 trach changed to #8 shiley, worsening cuff leak, #6 XLT distal put back in  12/16 trach changed to #8 distal XLT, no cuff leak, removed chest tube 12/20 placed on home vent 12/22 started ATC trials. Did send sputum as had some blood tinge. Having trouble getting home health assistance. 12/23 working on [employer]. Working on [employer]. Plan to change vent to PRN.  12/24 tolerated off vent Consults:    Procedures:  Tracheostomy present prior to admission 9/13 right chest tube > 9/16  Significant Diagnostic Tests:  September 13 chest x-ray showed right-sided pneumothorax, chronic fibrotic changes, tracheostomy in place Bronchoscopy 12/13> no clear tracheal injury  Micro Data:  December 13 SARS-CoV-2/influenza negative December 13  MRSA PCR  Neg  December 13 respiratory culture > few gram negative rods>> diptheroids   Antimicrobials:  Ceftriaxone   12/13 > 12/17   Interim history/subjective:   Remains off vent 48 hours. Tolerated  PMV Afebrile On 35% / 10 L flow  Objective   Blood pressure 115/67, pulse (!) 104, temperature 98.2 F (36.8 C), temperature source Oral, resp. rate (!) 31, height 5\' 5"  (1.651 m), weight 60.5 kg, SpO2 96 %.    FiO2 (%):  [35 %] 35 %   Intake/Output Summary (Last 24 hours) at 04/04/2020 0947 Last data filed at 04/04/2020 0400 Gross per 24 hour  Intake 1230.01 ml  Output 1400 ml  Net -169.99 ml   Filed Weights   03/31/20 0454 04/01/20 0439 04/04/20 0417  Weight: 58.9 kg 60.3 kg 60.5 kg    Examination: Constitutional: thin man in NAD  Eyes: EOMI, pupils equal Ears, nose, mouth, and throat: Size 8 XLT, cuffed down, minimal secretions Cardiovascular: RRR, ext warm Respiratory: No accessory muscle use crackles right base Gastrointestinal: Soft, +BS, PEG in place Skin: No rashes, normal turgor Neurologic: moves all 4 ext to command Psychiatric: RASS 0  12/21 chest x-ray independently reviewed stable bilateral airspace disease Labs 12/24 normal electrolytes  Resolved Hospital Problem list   Tachycardia: resolved Right pneumothorax > resolved CAP stopped 12/17 Assessment & Plan:   Acute on chronic respiratory failure with hypoxemia  Chronic respiratory failure dependent on home ventilator >  Virtual visit 01/20/2020 with RB reviewed >> he was with home hospice and left on nocturnal mechanical ventilation   -He is now liberated from the vent for 2 days, we will have to wait and see if we can keep him off vent long-term   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 -Tolerates distal XLT #8 , would like to downsize further but will wait to see whether  he tolerates being off vent so that we can go to cuffless, also his stoma is large and may not hold a 6 -Progressive mobility as able -Needs his second covid vax after 1/12  Anxiety - PRN ativan  DM2 - ssi   History of lymphoma> treated in spring 2021 prior to COVID, no assessment since then - ONC as out-pt    Severe deconditioning -Cont PT/OT, [facility]  Dysphagia  -Cont PEG feeds -Cont routine ostomy care  Disposition- hopefully home with supplemental O2 and no further need for vent; need to determine if he can tolerate PO with downsized trach He also needs extensive rehab,? is CIR an option   Best practice (evaluated daily)   Diet: tube feeding -->asking SLP to eval Pain/Anxiety/Delirium protocol (if indicated): as above VAP protocol (if indicated): yes DVT prophylaxis: lovenox GI prophylaxis: Pantoprazole for stress ulcer prophylaxis Glucose control: as above Mobility: keep working toward OOB to chair last date of multidisciplinary goals of care discussion 12/19 Family and staff present spouse, Kary Kos 12/19 Summary of discussion see above Follow up goals of care discussed with spouse over the phone.  She was unable to present to the hospital.  She continues to desire the patient come home Code Status: Full Disposition: hopefully working toward home soon once home trach size and supplies needed can be found, my goal for this would be after 12/27 due to holidays  Kara Mead MD. FCCP. [facility] See Amion for pager  If no response to pager , please call [phone]  After 7:00 pm call Elink  [phone]   04/04/2020

## 2020-04-05 DIAGNOSIS — J9621 Acute and chronic respiratory failure with hypoxia: Secondary | ICD-10-CM | POA: Diagnosis not present

## 2020-04-05 DIAGNOSIS — Z930 Tracheostomy status: Secondary | ICD-10-CM | POA: Diagnosis not present

## 2020-04-05 LAB — BASIC METABOLIC PANEL
Anion gap: 9 (ref 5–15)
BUN: 25 mg/dL — ABNORMAL HIGH (ref 8–23)
CO2: 33 mmol/L — ABNORMAL HIGH (ref 22–32)
Calcium: 9.3 mg/dL (ref 8.9–10.3)
Chloride: 96 mmol/L — ABNORMAL LOW (ref 98–111)
Creatinine, Ser: 0.56 mg/dL — ABNORMAL LOW (ref 0.61–1.24)
GFR, Estimated: >60 mL/min (ref >60)
Glucose, Bld: 125 mg/dL — ABNORMAL HIGH (ref 70–99)
Potassium: 4.3 mmol/L (ref 3.5–5.1)
Sodium: 138 mmol/L (ref 135–145)

## 2020-04-05 LAB — GLUCOSE, CAPILLARY
Glucose-Capillary: 139 mg/dL — ABNORMAL HIGH (ref 70–99)
Glucose-Capillary: 103 mg/dL — ABNORMAL HIGH (ref 70–99)
Glucose-Capillary: 135 mg/dL — ABNORMAL HIGH (ref 70–99)
Glucose-Capillary: 141 mg/dL — ABNORMAL HIGH (ref 70–99)

## 2020-04-05 LAB — CBC
HCT: 31.4 % — ABNORMAL LOW (ref 39.0–52.0)
Hemoglobin: 9.8 g/dL — ABNORMAL LOW (ref 13.0–17.0)
MCH: 28.6 pg (ref 26.0–34.0)
MCHC: 31.2 g/dL (ref 30.0–36.0)
MCV: 91.5 fL (ref 80.0–100.0)
Platelets: 267 10*3/uL (ref 150–400)
RBC: 3.43 MIL/uL — ABNORMAL LOW (ref 4.22–5.81)
RDW: 14.1 % (ref 11.5–15.5)
WBC: 9.8 10*3/uL (ref 4.0–10.5)
nRBC: 0 % (ref 0.0–0.2)

## 2020-04-05 NOTE — Progress Notes (Signed)
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  Baseline = bed bound, able to sit on side of bed only/ wife main caretaker  Past Medical History  Sleep apnea, on CPAP at home Post Covid pneumonia pulmonary fibrosis, on mechanical ventilation Chronic respiratory failure with hypoxemia on mechanical ventilation at home Hodgkin's lymphoma Hypertension History of pancreatitis Gastroesophageal reflux disease Diabetes mellitus type 2  Significant Hospital Events   December 13 admission, chest tube placed, trache exchanged 12/15 trach changed to #8 shiley, worsening cuff leak, #6 XLT distal put back in  12/16 trach changed to #8 distal XLT, no cuff leak, removed chest tube 12/20 placed on home vent 12/22 started ATC trials. Did send sputum as had some blood tinge. Having trouble getting home health assistance. 12/23 working on [employer]. Working on [employer]. Plan to change vent to PRN.  12/24 tolerated off vent Consults:    Procedures:  Tracheostomy present prior to admission 9/13 right chest tube > 9/16  Significant Diagnostic Tests:  September 13 chest x-ray showed right-sided pneumothorax, chronic fibrotic changes, tracheostomy in place Bronchoscopy 12/13> no clear tracheal injury  Micro Data:  December 13 SARS-CoV-2/influenza negative December 13  MRSA PCR  Neg  December 13 respiratory culture > few gram negative rods>> diptheroids   Antimicrobials:  Ceftriaxone   12/13 > 12/17   Interim history/subjective:   Remains off vent 72 hours. Afebrile On 35% / 8 L  flow  Objective   Blood pressure 103/73, pulse 82, temperature 97.8 F (36.6 C), temperature source Oral, resp. rate (!) 22, height 5\' 5"  (1.651 m), weight 60.5 kg, SpO2 99 %.    FiO2 (%):  [28 %-35 %] 35 %   Intake/Output Summary (Last 24 hours) at 04/05/2020 0954 Last data filed at 04/05/2020 0500 Gross per 24 hour  Intake --  Output 1025 ml  Net -1025 ml   Filed Weights   03/31/20 0454 04/01/20 0439 04/04/20 0417  Weight: 58.9 kg 60.3 kg 60.5 kg    Examination: Constitutional: thin man in NAD  Eyes: EOMI, pupils equal Ears, nose, mouth, and throat: Size 8 XLT, cuff down Cardiovascular: RRR, ext warm Respiratory: No accessory muscle use, clear breath sounds bilateral, minimal secretions Gastrointestinal: Soft, +BS, PEG in place Skin: No rashes, normal turgor Neurologic: Alert, interactive, able to phonate, nonfocal Psychiatric: RASS 0  12/21 chest x-ray reviewed shows stable bilateral airspace disease Labs 12/26 normal electrolytes, no leukocytosis, stable anemia  Resolved Hospital Problem list   Tachycardia: resolved Right pneumothorax > resolved CAP stopped 12/17 Assessment & Plan:   Acute on chronic respiratory failure with hypoxemia  Chronic respiratory failure dependent on home ventilator >  Virtual visit 01/20/2020 with RB reviewed >> he was with home hospice and left on nocturnal mechanical ventilation   -He is now liberated from the vent for 3 days, Hopefully he does not need ventilation -Continue to decrease FiO2   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 -Tolerates distal XLT #8 , would like to downsize further but will wait to see whether he tolerates being  off vent so that we can go to cuffless, also his stoma is large and may not hold a 6 -Progressive mobility as able    Anxiety - PRN ativan  DM2 - ssi   History of lymphoma> treated in spring 2021 prior to COVID, no assessment since then - ONC as out-pt   Severe  deconditioning Had not received PT for several months but feel he can rebound eventually -Cont PT/OT, rec'd for [facility]  Dysphagia  -Cont PEG feeds , -Downsizing may help swallowing -Cont routine ostomy care  Disposition- hopefully home with supplemental O2 and no further need for vent; need to determine if he can tolerate PO with downsized trach He  needs extensive rehab,? is CIR an option -Needs his second covid vax after 1/12  Best practice (evaluated daily)   Diet: tube feeding -->SLP to eval Pain/Anxiety/Delirium protocol (if indicated): as above VAP protocol (if indicated): yes DVT prophylaxis: lovenox GI prophylaxis: Pantoprazole for stress ulcer prophylaxis Glucose control: as above Mobility: keep working toward OOB to chair last date of multidisciplinary goals of care discussion 12/19 Family and staff present spouse, Kary Kos 12/19 Summary of discussion see above Follow up goals of care discussed with spouse over the phone.  Code Status: Full Disposition: hopefully working toward home vs rehab this week depending on progress  Kara Mead MD. Shade Flood. [facility] See Amion for pager  If no response to pager , please call [phone]  After 7:00 pm call Elink  [phone]   04/05/2020

## 2020-04-06 DIAGNOSIS — Z430 Encounter for attention to tracheostomy: Secondary | ICD-10-CM | POA: Diagnosis not present

## 2020-04-06 DIAGNOSIS — Z930 Tracheostomy status: Secondary | ICD-10-CM | POA: Diagnosis not present

## 2020-04-06 DIAGNOSIS — Z9911 Dependence on respirator [ventilator] status: Secondary | ICD-10-CM | POA: Diagnosis not present

## 2020-04-06 DIAGNOSIS — J939 Pneumothorax, unspecified: Secondary | ICD-10-CM | POA: Diagnosis not present

## 2020-04-06 LAB — GLUCOSE, CAPILLARY
Glucose-Capillary: 141 mg/dL — ABNORMAL HIGH (ref 70–99)
Glucose-Capillary: 97 mg/dL (ref 70–99)
Glucose-Capillary: 161 mg/dL — ABNORMAL HIGH (ref 70–99)
Glucose-Capillary: 124 mg/dL — ABNORMAL HIGH (ref 70–99)

## 2020-04-06 LAB — CREATININE, SERUM
Creatinine, Ser: 0.67 mg/dL (ref 0.61–1.24)
GFR, Estimated: >60 mL/min (ref >60)

## 2020-04-06 MED ORDER — CHLORHEXIDINE GLUCONATE 0.12 % MT SOLN
OROMUCOSAL | Status: AC
  Administered 2020-04-06: 20:00:00 15 mL via OROMUCOSAL
  Filled 2020-04-06: qty 15

## 2020-04-06 NOTE — TOC Progression Note (Addendum)
Transition of Care [facility]) - Progression Note    Patient Details  Name: Steve [name] MRN: [id number] Date of Birth: [birth year]  Transition of Care [facility]) CM/SW Contact  Leeroy Cha, RN Phone Number: 04/06/2020, 8:53 AM  Clinical Narrative:    [facility] Events   December 13 admission, chest tube placed, trache exchanged 12/15 trach changed to #8 shiley, worsening cuff leak, #6 XLT distal put back in  12/16 trach changed to #8 distal XLT, no cuff leak, removed chest tube 12/20 placed on home vent 12/22 started ATC trials. Did send sputum as had some blood tinge. Having trouble getting home health assistance. 12/23 working on [employer]. Working on [employer]. Plan to change vent to PRN.  12/24 tolerated off vent Plan: home care through [place].  Note concerning home care: Spoke with pt's wife on yesterday for a length of time concerning discharge plans. Also, spoke with different [facility] to follow pt at home. The only agency that will service this pt at home is [facility]. With a co-pay around $52.00/hour and  a minimum of 4 hours any day of service. Pt was with [facility] at home, however he no longer need Hospice Care. Before going home with [facility] pt was at [facility]. Spoke with pt's wife Steve [name] [phone] concerning pt going back to [facility] and the benefits, she did not want pt to go back related to him not getting PT during his stay there.  Steve [name] asked to check to see if [facility] would offer pt a bed. Explained that since this is Holiday day week, insurance company may not give authorization until Monday. This CM has already reached out to Both [facility], spoke with Anderson Malta at [employer]	[place]. A call was made to  Santiago Glad, admission coordinator at [facility].  Pt's information was faxed to Santiago Glad at [place] [phone].   Expected Discharge Plan: [place] Barriers to Discharge: No Barriers Identified  Expected  Discharge Plan and Services Expected Discharge Plan: [place]   Discharge Planning Services: CM Consult   Living arrangements for the past 2 months: Mobile Home                                       Social Determinants of Health (SDOH) Interventions    Readmission Risk Interventions Readmission Risk Prevention Plan 07/22/2019  Transportation Screening Complete  Medication Review [employer]) Complete  Some recent data might be hidden

## 2020-04-06 NOTE — Progress Notes (Signed)
RT NOTE:  CCM placed a trach change order for pt to downsize to a #6 shiley distal XLT cuffless trach today. Materials was contacted and they do not carry that trach at this hospital. RT called CCMD about the issue and MD stated it was okay to hold off on trach change today and possibly get the correct trach from [facility] when we are able to.

## 2020-04-06 NOTE — Telephone Encounter (Signed)
From what im being told nobody can take him until he is off of Hospice

## 2020-04-06 NOTE — TOC Progression Note (Addendum)
Transition of Care [facility]) - Progression Note    Patient Details  Name: Steve [name] MRN: [id number] Date of Birth: August 04, 1955  Transition of Care [facility]) CM/SW Contact  Leeroy Cha, RN Phone Number: 04/06/2020, 11:20 AM  Clinical Narrative:    tct-Karenj at [facility] and rehab, message left at [phone] on her voice mail to please return the call. tct admitting-allowed to ring 20 times and was disconnected.  Expected Discharge Plan: [place] Barriers to Discharge: No Barriers Identified  Expected Discharge Plan and Services Expected Discharge Plan: [place]   Discharge Planning Services: CM Consult   Living arrangements for the past 2 months: Mobile Home                                       Social Determinants of Health (SDOH) Interventions    Readmission Risk Interventions Readmission Risk Prevention Plan 07/22/2019  Transportation Screening Complete  Medication Review Press photographer) Complete  Some recent data might be hidden

## 2020-04-06 NOTE — Progress Notes (Signed)
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 S/p chest tube and Tracheostomy Exchange 12/13. Patient on trach collar/vent PRN.    PT Comments    The patient  Resting in bed on 35% TC, Spo2 95%. Patient Mobilized to sitting on bed edge without assistance. Noted SPO2 dropping  Into mid 80's. Placed on 40% TC. Patient  Required mod Assist to stand at EVA walker with platform. Patient took 4 steps forward then back to recliner. Noted SPO2 dropped to 78%. RR 40. Patient sat quietly with gradual return to 90%. Placed back on 35% with SPO2 92 %, RR 30.   Patient is very motivated to progress standing and progress short distance ambulation ans monitor respiratory status.  Follow Up Recommendations  Home health PT;CIR     Equipment Recommendations  Wheelchair (measurements PT);Wheelchair cushion (measurements PT)    Recommendations for Other Services       Precautions / Restrictions Precautions Precaution Comments: trach, PEG tube, has a PMV( do not have it inplace whn mobilizing), needs his sneakers brought in.    Mobility  Bed Mobility   Bed Mobility: Supine to Sit     Supine to sit: Min guard     General bed mobility comments: pt. mobilized self without assistance  Transfers Overall transfer level: Needs assistance Equipment used: Bilateral platform walker (EVA\) Transfers: Stand Pivot Transfers   Stand pivot transfers: Mod assist       General transfer comment: 2 assist to power up from bed, Patiwent able to reach for RW grips. Patient took 4 steps forward then back to  recliner. Patient on 40% TC/8 L. SPO2 dropped to 78%. Patient encouraged to relax, slow down breaths.  Ambulation/Gait                 Stairs             Wheelchair Mobility    Modified Rankin (Stroke Patients Only)       Balance   Sitting-balance support: Feet supported;No upper extremity supported;Bilateral upper extremity supported Sitting balance-Leahy Scale: Fair Sitting balance - Comments: supports self with UE's.   Standing balance support: Bilateral upper extremity supported Standing balance-Leahy Scale: Poor Standing balance comment: reliant on support                            Cognition Arousal/Alertness: Awake/alert Behavior During Therapy: WFL for tasks assessed/performed Overall Cognitive Status: Within Functional Limits for tasks assessed                                        Exercises      General Comments        Pertinent Vitals/Pain Faces Pain Scale: No hurt    Home Living                      Prior Function            PT Goals (current goals can now be found  in the care plan section) Progress towards PT goals: Progressing toward goals    Frequency    Min 3X/week      PT Plan Current plan remains appropriate;Frequency needs to be updated    Co-evaluation              AM-PAC PT "6 Clicks" Mobility   Outcome Measure  Help needed turning from your back to your side while in a flat bed without using bedrails?: A Little Help needed moving from lying on your back to sitting on the side of a flat bed without using bedrails?: A Little Help needed moving to and from a bed to a chair (including a wheelchair)?: A Lot Help needed standing up from a chair using your arms (e.g., wheelchair or bedside chair)?: A Lot Help needed to walk in hospital room?: Total Help needed climbing 3-5 steps with a railing? : Total 6 Click Score: 12    End of Session Equipment Utilized During  Treatment: Gait belt;Oxygen Activity Tolerance: Patient tolerated treatment well;Treatment limited secondary to medical complications (Comment) (desaturation) Patient left: in chair;with call bell/phone within reach;with family/visitor present Nurse Communication: Mobility status PT Visit Diagnosis: Muscle weakness (generalized) (M62.81)     Time: 9038-3338 PT Time Calculation (min) (ACUTE ONLY): 32 min  Charges:  $Therapeutic Activity: 23-37 mins                     [name] PT Acute Rehabilitation Services Pager [phone] Office [phone]    Claretha Cooper 04/06/2020, 1:36 PM

## 2020-04-06 NOTE — Progress Notes (Addendum)
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  Baseline = bed bound, able to sit on side of bed only/ wife main caretaker  Past Medical History  Sleep apnea, on CPAP at home Post Covid pneumonia pulmonary fibrosis, on mechanical ventilation Chronic respiratory failure with hypoxemia on mechanical ventilation at home Hodgkin's lymphoma Hypertension History of pancreatitis Gastroesophageal reflux disease Diabetes mellitus type 2  Significant Hospital Events   December 13 admission, chest tube placed, trache exchanged 12/15 trach changed to #8 shiley, worsening cuff leak, #6 XLT distal put back in  12/16 trach changed to #8 distal XLT, no cuff leak, removed chest tube 12/20 placed on home vent 12/22 started ATC trials. Did send sputum as had some blood tinge. Having trouble getting home health assistance. 12/23 working on [employer]. Working on [employer]. Plan to change vent to PRN.  12/24 tolerated off vent  Consults:    Procedures:  Tracheostomy present prior to admission 9/13 right chest tube > 9/16  Significant Diagnostic Tests:  September 13 chest x-ray showed right-sided pneumothorax, chronic fibrotic changes, tracheostomy in place Bronchoscopy 12/13> no clear tracheal injury  Micro Data:  December 13 SARS-CoV-2/influenza negative December 13  MRSA PCR  Neg  December 13 respiratory culture > few gram negative rods>> diptheroids   Antimicrobials:  Ceftriaxone   12/13 > 12/17  Interim history/subjective:  Remains off vent 96 hours.  On 35% / 10L  flow  Objective   Blood pressure 119/81, pulse (!) 101, temperature 98 F (36.7 C), temperature source Oral, resp. rate (!) 23, height 5\' 5"  (1.651 m), weight 60.5 kg, SpO2 91 %.    FiO2 (%):  [35 %] 35 %   Intake/Output Summary (Last 24 hours) at 04/06/2020 0807 Last data filed at 04/06/2020 0751 Gross per 24 hour  Intake 10 ml  Output 1125 ml  Net -1115 ml   Filed Weights   03/31/20 0454 04/01/20 0439 04/04/20 0417  Weight: 58.9 kg 60.3 kg 60.5 kg    Examination: General: Adult male, chronically ill appearing, resting in bed, in NAD. Neuro: A&O x 3, no deficits. HEENT: [place]/AT. Sclerae anicteric. EOMI.  Trach C/D/I. Cardiovascular: RRR, no M/R/G.  Lungs: Respirations even and unlabored.  Faint rhonchi. Abdomen: BS x 4, soft, NT/ND.  Musculoskeletal: No gross deformities, no edema.  Skin: Intact, warm, no rashes.   Resolved Hospital Problem list   Tachycardia: resolved Right pneumothorax > resolved CAP stopped 12/17  Assessment & Plan:   Hx COVID PNA - s/p trach. Acute on chronic respiratory failure with hypoxemia  Chronic respiratory failure dependent on home ventilator >  Virtual visit 01/20/2020 with RB reviewed >> he was with home hospice and left on nocturnal mechanical ventilation  - He is now liberated from the vent for 4 days and remains on 10 L flow / 35% FiO2. - Continue ATC and wean oxygen as able. - Bronchial hygiene. - Needs 2nd COVID vaccine after 1/12.  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 -  Continue PMV.  Stenotrophomonas in sputum. - Continue levaquin.  Anxiety. - PRN ativan.  DM2. - SSI.  History of lymphoma> treated in spring 2021 prior to [place], no assessment since then - ONC as out-pt.  Severe deconditioning Had not received PT for several months but feel he can rebound eventually - Cont PT/OT, rec'd for CIR.  Will place consult.  Dysphagia  - Cont PEG  feeds. - SLP following, downsizing trach may help swallowing.  Disposition- hopefully no further need for vent; need to determine if he can tolerate PO with downsized trach.  He needs extensive rehab, ? is CIR an option. - CIR consult placed. - Needs his second covid vax after 1/12.  Best practice (evaluated daily)   Diet: tube feeding for now.  Continue SLP eval after trach change Pain/Anxiety/Delirium protocol (if indicated): as above VAP protocol (if indicated): yes DVT prophylaxis: lovenox GI prophylaxis: Pantoprazole for stress ulcer prophylaxis Glucose control: as above Mobility: keep working toward OOB to chair last date of multidisciplinary goals of care discussion 12/19 Family and staff present spouse, Kary Kos 12/19 Summary of discussion see above Follow up goals of care discussed with spouse over the phone.  Code Status: Full Disposition: CIR consult placed   Montey Hora, [place] For pager details, please see AMION 04/06/2020, 8:26 AM

## 2020-04-07 ENCOUNTER — Inpatient Hospital Stay (HOSPITAL_COMMUNITY): Payer: BC Managed Care – PPO

## 2020-04-07 DIAGNOSIS — J939 Pneumothorax, unspecified: Secondary | ICD-10-CM | POA: Diagnosis not present

## 2020-04-07 DIAGNOSIS — Z9911 Dependence on respirator [ventilator] status: Secondary | ICD-10-CM | POA: Diagnosis not present

## 2020-04-07 DIAGNOSIS — Z930 Tracheostomy status: Secondary | ICD-10-CM | POA: Diagnosis not present

## 2020-04-07 DIAGNOSIS — Z430 Encounter for attention to tracheostomy: Secondary | ICD-10-CM | POA: Diagnosis not present

## 2020-04-07 LAB — CBC
HCT: 32.4 % — ABNORMAL LOW (ref 39.0–52.0)
Hemoglobin: 10.1 g/dL — ABNORMAL LOW (ref 13.0–17.0)
MCH: 28.8 pg (ref 26.0–34.0)
MCHC: 31.2 g/dL (ref 30.0–36.0)
MCV: 92.3 fL (ref 80.0–100.0)
Platelets: 252 10*3/uL (ref 150–400)
RBC: 3.51 MIL/uL — ABNORMAL LOW (ref 4.22–5.81)
RDW: 14 % (ref 11.5–15.5)
WBC: 9.7 10*3/uL (ref 4.0–10.5)
nRBC: 0 % (ref 0.0–0.2)

## 2020-04-07 LAB — GLUCOSE, CAPILLARY
Glucose-Capillary: 149 mg/dL — ABNORMAL HIGH (ref 70–99)
Glucose-Capillary: 111 mg/dL — ABNORMAL HIGH (ref 70–99)
Glucose-Capillary: 91 mg/dL (ref 70–99)
Glucose-Capillary: 130 mg/dL — ABNORMAL HIGH (ref 70–99)

## 2020-04-07 LAB — BASIC METABOLIC PANEL
Anion gap: 10 (ref 5–15)
BUN: 24 mg/dL — ABNORMAL HIGH (ref 8–23)
CO2: 30 mmol/L (ref 22–32)
Calcium: 9.1 mg/dL (ref 8.9–10.3)
Chloride: 97 mmol/L — ABNORMAL LOW (ref 98–111)
Creatinine, Ser: 0.57 mg/dL — ABNORMAL LOW (ref 0.61–1.24)
GFR, Estimated: >60 mL/min (ref >60)
Glucose, Bld: 154 mg/dL — ABNORMAL HIGH (ref 70–99)
Potassium: 4.3 mmol/L (ref 3.5–5.1)
Sodium: 137 mmol/L (ref 135–145)

## 2020-04-07 LAB — MAGNESIUM: Magnesium: 1.8 mg/dL (ref 1.7–2.4)

## 2020-04-07 LAB — PHOSPHORUS: Phosphorus: 3.6 mg/dL (ref 2.5–4.6)

## 2020-04-07 MED ORDER — CHLORHEXIDINE GLUCONATE 0.12 % MT SOLN
15.0000 mL | Freq: Two times a day (BID) | OROMUCOSAL | Status: DC
  Administered 2020-04-07 – 2020-04-17 (×21): 15 mL via OROMUCOSAL
  Filled 2020-04-07 (×18): qty 15

## 2020-04-07 MED ORDER — ORAL CARE MOUTH RINSE
15.0000 mL | Freq: Two times a day (BID) | OROMUCOSAL | Status: DC
  Administered 2020-04-07 – 2020-04-17 (×20): 15 mL via OROMUCOSAL

## 2020-04-07 NOTE — Progress Notes (Addendum)
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  Baseline = bed bound, able to sit on side of bed only/ wife main caretaker  Past Medical History  Sleep apnea, on CPAP at home Post Covid pneumonia pulmonary fibrosis, on mechanical ventilation Chronic respiratory failure with hypoxemia on mechanical ventilation at home Hodgkin's lymphoma Hypertension History of pancreatitis Gastroesophageal reflux disease Diabetes mellitus type 2  Significant Hospital Events   December 13 admission, chest tube placed, trache exchanged 12/15 trach changed to #8 shiley, worsening cuff leak, #6 XLT distal put back in  12/16 trach changed to #8 distal XLT, no cuff leak, removed chest tube 12/20 placed on home vent 12/22 started ATC trials. Did send sputum as had some blood tinge. Having trouble getting home health assistance. 12/23 working on [employer]. Working on [employer]. Plan to change vent to PRN.  12/24 tolerated off vent  Consults:    Procedures:  Tracheostomy present prior to admission 9/13 right chest tube > 9/16  Significant Diagnostic Tests:  September 13 chest x-ray showed right-sided pneumothorax, chronic fibrotic changes, tracheostomy in place Bronchoscopy 12/13> no clear tracheal injury  Micro Data:  December 13 SARS-CoV-2/influenza negative December 13  MRSA PCR  Neg  December 13 respiratory culture > few gram negative rods>> diptheroids   Antimicrobials:  Ceftriaxone   12/13 > 12/17 Levaquin 12/26 > 12/27  Interim history/subjective:  No acute events. Remains off  vent.  Decreased FiO2 to 28% this AM. Unable to change trach yesterday as no 6 distal XLT in stock at [facility].  Per RN, RT checked in with [facility] who also has none available. Will ask that they continue to try and source today.  If not, can attempt regular 6 cuffless shiley and have backup 8 XLT available in case regular is not appropriate.  Objective   Blood pressure 113/61, pulse 92, temperature 98 F (36.7 C), temperature source Oral, resp. rate (!) 30, height 5\' 5"  (1.651 m), weight 60.4 kg, SpO2 95 %.    FiO2 (%):  [35 %-40 %] 40 %   Intake/Output Summary (Last 24 hours) at 04/07/2020 0720 Last data filed at 04/07/2020 0600 Gross per 24 hour  Intake 5800 ml  Output 1425 ml  Net 4375 ml   Filed Weights   04/01/20 0439 04/04/20 0417 04/07/20 0500  Weight: 60.3 kg 60.5 kg 60.4 kg    Examination:  General: Adult male, chronically ill appearing, resting in bed comfortably, in NAD. Neuro: A&O x 3, no deficits. HEENT: [place]/AT. Sclerae anicteric. EOMI.  Trach C/D/I. Cardiovascular: RRR, no M/R/G.  Lungs: Respirations even and unlabored.  Faint rhonchi. Abdomen: BS x 4, soft, NT/ND.  Musculoskeletal: No gross deformities, no edema.  Skin: Intact, warm, no rashes.   Resolved Hospital Problem list   Tachycardia: resolved Right pneumothorax > resolved CAP stopped 12/17  Assessment & Plan:   Hx COVID PNA - s/p trach. Acute on chronic respiratory failure with hypoxemia  Chronic respiratory failure dependent on home ventilator >  Virtual visit 01/20/2020 with RB reviewed >> he was with home hospice and  left on nocturnal mechanical ventilation  - He is now liberated from the vent for 5 days and remains on 5 L flow / 28% FiO2 (lowered from 6L and 35% this AM). - Continue ATC and wean oxygen as able. - Bronchial hygiene. - Needs 2nd COVID vaccine after 1/12.  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  Per RN, also not available at [facility].  Will ask to continue attempt at sourcing. If not, can attempt regular 6 cuffless shiley and have backup 8 XLT available in case regular is not appropriate).  Stenotrophomonas in sputum. - OK to stop levaquin per pharmacy as likely contaminant.  Anxiety. - PRN ativan.  DM2. - SSI.  History of lymphoma> treated in spring 2021 prior to [place], no assessment since then - ONC as out-pt.  Severe deconditioning Had not received PT for several months but feel he can rebound eventually - Cont PT/OT, rec'd for CIR.  Consult placed. - Appreciate case management assistance in looking for placement if CIR is not an option.  Dysphagia  - Cont PEG feeds. - SLP following, downsizing trach may help swallowing.  Disposition- hopefully no further need for vent; need to determine if he can tolerate PO with downsized trach.  He needs extensive rehab, ? is CIR an option. - CIR consult placed. - Needs his second covid vax after 1/12.  Stable for progressive care with ATC.  Will ask TRH to assume care as primary team in AM 12/29.  PCCM to follow for trach.  Best practice (evaluated daily)   Diet: tube feeding for now.  Continue SLP eval after trach change Pain/Anxiety/Delirium protocol (if indicated): as above VAP protocol (if indicated): yes DVT prophylaxis: lovenox GI prophylaxis: Pantoprazole for stress ulcer prophylaxis Glucose control: as above Mobility: keep working toward OOB to chair last date of multidisciplinary goals of care discussion 12/19 Family and staff present spouse, Kary Kos 12/19 Code Status: Full Disposition: CIR consult placed   Montey Hora, [place] For pager details, please see AMION 04/07/2020, 7:20 AM

## 2020-04-07 NOTE — Progress Notes (Signed)
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 S/p chest tube and Tracheostomy Exchange 12/13. Patient on trach collar/vent PRN.    PT Comments    Pt agreeable to mobilize however only able to sit EOB for 15 minutes.  Pt with increased work of breathing and required suctioning by RN twice during session.  SpO2 dropped to high 70s upon sitting on 28% 5L and only able to improve to mid 80s with relaxation and breathing technique.  Increased to 35% 8L and Spo2 84% upon return to bed.  RN notified and aware.   Follow Up Recommendations  CIR     Equipment Recommendations  Wheelchair (measurements PT);Wheelchair cushion (measurements PT)    Recommendations for Other Services       Precautions / Restrictions Precautions Precaution Comments: trach, PEG tube, has a PMV( do not have it in place when mobilizing), needs his sneakers brought in.    Mobility  Bed Mobility Overal bed mobility: Needs Assistance Bed Mobility: Supine to Sit;Sit to Supine     Supine to sit: Supervision Sit to supine: Min assist   General bed mobility comments: assist for LEs onto bed  Transfers                 General transfer comment: pt not able to tolerate OOB today  Ambulation/Gait                 Stairs             Wheelchair Mobility    Modified Rankin (Stroke Patients Only)       Balance Overall balance assessment: Needs assistance Sitting-balance support: No upper extremity supported Sitting balance-Leahy Scale: Fair                                       Cognition Arousal/Alertness: Awake/alert Behavior During Therapy: Anxious Overall Cognitive Status: Within Functional Limits for tasks assessed                                 General Comments: RN brought anxiety meds beginning of session      Exercises      General Comments        Pertinent Vitals/Pain Pain Assessment: No/denies pain    Home Living                      Prior Function            PT Goals (current goals can now be found in the care plan section) Acute Rehab PT Goals PT Goal Formulation: With patient Time For Goal Achievement: 04/21/20 Potential to Achieve Goals: Fair Progress towards PT goals: Progressing toward goals    Frequency    Min 3X/week      PT Plan Current plan remains appropriate    Co-evaluation              AM-PAC PT "6 Clicks" Mobility   Outcome Measure  Help needed turning from your back to your side while in a flat bed without using bedrails?: A Little Help needed moving from lying on your back to sitting on the side of a flat bed without using bedrails?: A Little Help needed moving to and from a bed to a chair (including a wheelchair)?: A Lot Help needed standing up from a chair using your arms (e.g., wheelchair or bedside chair)?: A Lot Help needed to walk in hospital room?: Total Help needed climbing 3-5 steps with a railing? : Total 6 Click Score: 12    End of Session Equipment Utilized During Treatment: Gait belt;Oxygen Activity Tolerance: Other (comment) (coughing, required suction, desats) Patient left: with call bell/phone within reach;in bed;with nursing/sitter in room;with bed alarm set Nurse Communication: Mobility status PT Visit Diagnosis: Muscle weakness (generalized) (M62.81)     Time: 4503-8882 PT Time Calculation (min) (ACUTE ONLY): 30 min  Charges:  $Therapeutic Activity: 23-37 mins                    Jannette Spanner PT, DPT Acute Rehabilitation Services Pager:  [phone] Office: [phone]  York Ram E 04/07/2020, 1:29 PM

## 2020-04-07 NOTE — Progress Notes (Signed)
Inpatient Rehabilitation Admissions Coordinator  I met with patient at bedside for assessment and to discuss goals and expectations of a possible Cir admit. I feel he is a great candidate pendning OT recommendations for CIR rather than HH. I can begin insurance authorization once therapies concur for CIR recommendation. I will follow up tomorrow.  Danne Baxter, RN, MSN Rehab Admissions Coordinator [phone] 04/07/2020 4:36 PM

## 2020-04-07 NOTE — Progress Notes (Signed)
Inpatient Rehabilitation Admissions Coordinator  Inpatient rehab consult received. I contacted patient's wife by phone to assess PLOF since COVID 06/2019 and to discuss goals and expectations of a possible CIR admit prior to return home. I will meet with patient at bedside today for assessment and further discussions with patient.I have updated acute team and TOC. I will follow up once I have completed bedside assessments.  Danne Baxter, RN, MSN Rehab Admissions Coordinator [phone] 04/07/2020 11:30 AM

## 2020-04-07 NOTE — TOC Progression Note (Signed)
Transition of Care [facility]) - Progression Note    Patient Details  Name: Steve [name] MRN: [id number] Date of Birth: October 13, 1955  [facility]) CM/SW Contact  Leeroy Cha, RN Phone Number: 04/07/2020, 8:18 AM  Clinical Narrative:    INFORMATION SENT BACK OUT TO [facility] FOR rn FOR TRACHE CARE SINCE OFF VENT.   Expected Discharge Plan: [place] Barriers to Discharge: No Barriers Identified  Expected Discharge Plan and Services Expected Discharge Plan: [place]   Discharge Planning Services: CM Consult   Living arrangements for the past 2 months: Mobile Home                                       Social Determinants of Health (SDOH) Interventions    Readmission Risk Interventions Readmission Risk Prevention Plan 07/22/2019  Transportation Screening Complete  Medication Review Press photographer) Complete  Some recent data might be hidden

## 2020-04-07 NOTE — Procedures (Signed)
Tracheostomy Change Note  Patient Details:   Name: Steve [name] DOB: June 05, 1955 MRN: [id number]    Airway Documentation:     Evaluation  O2 sats: stable throughout Complications: No apparent complications Patient did tolerate procedure well. Bilateral Breath Sounds: Diminished    Affan Callow A Lorilyn Laitinen 04/07/2020, 12:12 PM

## 2020-04-07 NOTE — Progress Notes (Signed)
SLP Cancellation Note  Patient Details Name: Steve [name] MRN: [id number] DOB: 1956/02/19   Cancelled treatment:       Reason Eval/Treat Not Completed: Other (comment) (patient awaiting downsize of trach from #8 to #6. SLP will proceed with MBS when trach downsized)   Sonia Baller, MA, CCC-SLP Speech Therapy

## 2020-04-07 NOTE — Progress Notes (Signed)
Nutrition Follow-up  DOCUMENTATION CODES:   Non-severe (moderate) malnutrition in context of chronic illness  INTERVENTION:   -Osmolite 1.2 @ 65 ml/hr via PEG -45 ml Prosource TF TID -Free water per MD: 100 ml every 4 hours -Provides 1992 kcals, 119g protein and 1879 ml H2O  NUTRITION DIAGNOSIS:   Moderate Malnutrition related to chronic illness (lymphoma) as evidenced by moderate fat depletion,moderate muscle depletion,severe muscle depletion.  Ongoing.  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 Patient presented to the ED due to 2-day hx of chest tightness and increased cough. He was found to have malpositioned trach and new R-sided pneumothorax.  Pt now off vent. S/p tracheostomy change today.  SLP to perform MBS pending trach change. Continues to be NPO. Will monitor for diet advancement Osmolite 1.2 continues at 65 ml/hr via PEG.  Admission weight: 132 lbs. Current weight: 133 lbs.  Medications:  Labs reviewed: CBGs: 111-149  Diet Order:   Diet Order            Diet NPO time specified  Diet effective now                 EDUCATION NEEDS:   Education needs have been addressed  Skin:  Skin Assessment: Skin Integrity Issues: Skin Integrity Issues:: Stage I Stage I: bilateral heels Unstageable: full thickness to mid coccyx  Last BM:  12/20 (type 7 x1)  Height:   Ht Readings from Last 1 Encounters:  03/23/20 5\' 5"  (1.651 m)    Weight:   Wt Readings from Last 1 Encounters:  04/07/20 60.4 kg    Ideal Body Weight:  61.8 kg  BMI:  Body mass index is 22.16 kg/m.  Estimated Nutritional Needs:   Kcal:  1990-2175 kcal  Protein:  105-120 grams  Fluid:   >/= 2.1 L/day  Clayton Bibles, MS, RD, LDN Inpatient Clinical Dietitian Contact information available via Amion

## 2020-04-08 ENCOUNTER — Encounter (HOSPITAL_COMMUNITY): Payer: Self-pay | Admitting: Pulmonary Disease

## 2020-04-08 ENCOUNTER — Inpatient Hospital Stay (HOSPITAL_COMMUNITY): Payer: BC Managed Care – PPO

## 2020-04-08 DIAGNOSIS — Z9911 Dependence on respirator [ventilator] status: Secondary | ICD-10-CM | POA: Diagnosis not present

## 2020-04-08 DIAGNOSIS — R1310 Dysphagia, unspecified: Secondary | ICD-10-CM

## 2020-04-08 DIAGNOSIS — E119 Type 2 diabetes mellitus without complications: Secondary | ICD-10-CM

## 2020-04-08 DIAGNOSIS — F419 Anxiety disorder, unspecified: Secondary | ICD-10-CM

## 2020-04-08 DIAGNOSIS — R5381 Other malaise: Secondary | ICD-10-CM

## 2020-04-08 DIAGNOSIS — J939 Pneumothorax, unspecified: Secondary | ICD-10-CM | POA: Diagnosis not present

## 2020-04-08 DIAGNOSIS — Z930 Tracheostomy status: Secondary | ICD-10-CM | POA: Diagnosis not present

## 2020-04-08 DIAGNOSIS — J9621 Acute and chronic respiratory failure with hypoxia: Secondary | ICD-10-CM | POA: Diagnosis not present

## 2020-04-08 LAB — RENAL FUNCTION PANEL
Albumin: 3 g/dL — ABNORMAL LOW (ref 3.5–5.0)
Anion gap: 9 (ref 5–15)
BUN: 26 mg/dL — ABNORMAL HIGH (ref 8–23)
CO2: 31 mmol/L (ref 22–32)
Calcium: 9.2 mg/dL (ref 8.9–10.3)
Chloride: 96 mmol/L — ABNORMAL LOW (ref 98–111)
Creatinine, Ser: 0.67 mg/dL (ref 0.61–1.24)
GFR, Estimated: >60 mL/min (ref >60)
Glucose, Bld: 125 mg/dL — ABNORMAL HIGH (ref 70–99)
Phosphorus: 3.8 mg/dL (ref 2.5–4.6)
Potassium: 4.1 mmol/L (ref 3.5–5.1)
Sodium: 136 mmol/L (ref 135–145)

## 2020-04-08 LAB — GLUCOSE, CAPILLARY
Glucose-Capillary: 137 mg/dL — ABNORMAL HIGH (ref 70–99)
Glucose-Capillary: 170 mg/dL — ABNORMAL HIGH (ref 70–99)
Glucose-Capillary: 109 mg/dL — ABNORMAL HIGH (ref 70–99)
Glucose-Capillary: 137 mg/dL — ABNORMAL HIGH (ref 70–99)

## 2020-04-08 LAB — CBC WITH DIFFERENTIAL/PLATELET
Abs Immature Granulocytes: 0.04 10*3/uL (ref 0.00–0.07)
Basophils Absolute: 0 10*3/uL (ref 0.0–0.1)
Basophils Relative: 0 %
Eosinophils Absolute: 0.2 10*3/uL (ref 0.0–0.5)
Eosinophils Relative: 3 %
HCT: 32.8 % — ABNORMAL LOW (ref 39.0–52.0)
Hemoglobin: 10.3 g/dL — ABNORMAL LOW (ref 13.0–17.0)
Immature Granulocytes: 0 %
Lymphocytes Relative: 28 %
Lymphs Abs: 2.5 10*3/uL (ref 0.7–4.0)
MCH: 28.9 pg (ref 26.0–34.0)
MCHC: 31.4 g/dL (ref 30.0–36.0)
MCV: 92.1 fL (ref 80.0–100.0)
Monocytes Absolute: 1.1 10*3/uL — ABNORMAL HIGH (ref 0.1–1.0)
Monocytes Relative: 12 %
Neutro Abs: 5.1 10*3/uL (ref 1.7–7.7)
Neutrophils Relative %: 57 %
Platelets: 242 10*3/uL (ref 150–400)
RBC: 3.56 MIL/uL — ABNORMAL LOW (ref 4.22–5.81)
RDW: 14.2 % (ref 11.5–15.5)
WBC: 9 10*3/uL (ref 4.0–10.5)
nRBC: 0 % (ref 0.0–0.2)

## 2020-04-08 LAB — MAGNESIUM: Magnesium: 1.9 mg/dL (ref 1.7–2.4)

## 2020-04-08 NOTE — Progress Notes (Addendum)
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 S/p chest tube and Tracheostomy Exchange 12/13. Patient on trach collar/vent PRN.    PT Comments    Patient resting in bed on40% TC. Patient able to sit up to bed edge without assitnace, moves slowly. Patient  Assisted to stand at [facility]. RW platform). Patient with noted Left foot drop/tightness of heel cord with weight bearing. Patient took a few steps to recliner. SPO2 dropping to 67%, RR 44. Patient required ~ 3-4 minutes to return SPO2 back to 88%. RN in room throughout. Patient putting forth effort, Desaturation and SOB with activity. Continue progressive activity, mo.sign itor saturation.   Follow Up Recommendations  CIR     Equipment Recommendations  Wheelchair (measurements PT);Wheelchair cushion (measurements PT)    Recommendations for Other Services       Precautions / Restrictions Precautions Precautions: Fall Precaution Comments: trach, PEG tube, has a PMV( do not have it in place when mobilizing), needs his sneakers brought in.Per [name], NP, ok to increase to 50% TC    Mobility  Bed Mobility Overal bed mobility: Needs Assistance Bed Mobility: Supine to Sit     Supine to sit: Supervision        Transfers Overall transfer level: Needs assistance Equipment used: Bilateral platform walker Transfers: Sit to/from Stand Sit to Stand: Min assist;+2 safety/equipment         General transfer comment: Mod assist of 2 for safety. took  ~ 4 steps forward then backward to recliner, Noted L foot drop > right. Patient on  40% TC/10 L. SPO2 dropping to 64 %, RR in 40's.  Ambulation/Gait                 Stairs             Wheelchair Mobility    Modified Rankin (Stroke Patients Only)       Balance Overall balance assessment: Independent Sitting-balance support: Bilateral upper extremity supported Sitting balance-Leahy Scale: Fair Sitting balance - Comments: supports self with UE's.   Standing balance support: Bilateral upper extremity supported Standing balance-Leahy Scale: Poor Standing balance comment: reliant on support                            Cognition Arousal/Alertness: Awake/alert Behavior During Therapy: Anxious Overall Cognitive Status: Within Functional Limits for tasks assessed                                 General Comments: anxious when SOb      Exercises      General Comments        Pertinent Vitals/Pain Pain Assessment: No/denies pain    Home Living                      Prior Function            PT Goals (current goals can now be found in the care plan section) Progress towards PT goals: Progressing toward goals  Frequency    Min 3X/week      PT Plan Current plan remains appropriate    Co-evaluation PT/OT/SLP Co-Evaluation/Treatment: Yes Reason for Co-Treatment: Complexity of the patient's impairments (multi-system involvement);To address functional/ADL transfers;For patient/therapist safety PT goals addressed during session: Mobility/safety with mobility OT goals addressed during session: ADL's and self-care      AM-PAC PT "6 Clicks" Mobility   Outcome Measure  Help needed turning from your back to your side while in a flat bed without using bedrails?: A Little Help needed moving from lying on your back to sitting on the side of a flat bed without using bedrails?: A Little Help needed moving to and from a bed to a chair (including a wheelchair)?: A Lot Help needed standing up from a chair using  your arms (e.g., wheelchair or bedside chair)?: A Lot Help needed to walk in hospital room?: Total Help needed climbing 3-5 steps with a railing? : Total 6 Click Score: 12    End of Session Equipment Utilized During Treatment: Gait belt;Oxygen Activity Tolerance: Treatment limited secondary to medical complications (Comment) Patient left: with call bell/phone within reach;in bed;with nursing/sitter in room;with chair alarm set Nurse Communication: Mobility status PT Visit Diagnosis: Muscle weakness (generalized) (M62.81)     Time: 6269-4854 PT Time Calculation (min) (ACUTE ONLY): 39 min  Charges:  $Therapeutic Activity: 8-22 mins                     [name] PT Acute Rehabilitation Services Pager [phone] Office [phone]    Claretha Cooper 04/08/2020, 12:38 PM

## 2020-04-08 NOTE — Progress Notes (Signed)
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  Baseline = bed bound, able to sit on side of bed only/ wife main caretaker  Past Medical History  Sleep apnea, on CPAP at home Post Covid pneumonia pulmonary fibrosis, on mechanical ventilation Chronic respiratory failure with hypoxemia on mechanical ventilation at home Hodgkin's lymphoma Hypertension History of pancreatitis Gastroesophageal reflux disease Diabetes mellitus type 2  Significant Hospital Events   December 13 admission, chest tube placed, trache exchanged 12/15 trach changed to #8 shiley, worsening cuff leak, #6 XLT distal put back in  12/16 trach changed to #8 distal XLT, no cuff leak, removed chest tube 12/20 placed on home vent 12/22 started ATC trials. Did send sputum as had some blood tinge. Having trouble getting home health assistance. 12/23 working on [employer]. Working on [employer]. Plan to change vent to PRN.  12/24 tolerated off vent 12/28 changed to cuffless trach, remains off ventilator 12/29 swallow evaluation completed. No witnessed aspiration but did desaturate due to endurance and deconditioning Consults:    Procedures:  Tracheostomy present prior to admission 9/13 right chest tube > 9/16  Significant Diagnostic Tests:  September 13 chest x-ray showed right-sided pneumothorax, chronic fibrotic changes, tracheostomy in place Bronchoscopy 12/13> no clear tracheal injury  Micro Data:  December 13 SARS-CoV-2/influenza negative December 13  MRSA PCR  Neg  December 13 respiratory culture >  few gram negative rods>> diptheroids   Antimicrobials:  Ceftriaxone   12/13 > 12/17 Levaquin 12/26 > 12/27  Interim history/subjective:  Did well today. Now has size 6 cuffless trach. Did desaturate during swallow evaluation but this was endurance related and not any witnessed aspiration  Objective   Blood pressure 113/68, pulse 91, temperature 98.4 F (36.9 C), temperature source Oral, resp. rate (Abnormal) 28, height 5\' 5"  (1.651 m), weight 60.3 kg, SpO2 98 %.    FiO2 (%):  [40 %-60 %] 40 %   Intake/Output Summary (Last 24 hours) at 04/08/2020 5631 Last data filed at 04/08/2020 4970 Gross per 24 hour  Intake 1430 ml  Output 1400 ml  Net 30 ml   Filed Weights   04/04/20 0417 04/07/20 0500 04/08/20 0500  Weight: 60.5 kg 60.4 kg 60.3 kg    Examination:   General 64 year old male patient resting in bed he is in no acute distress HEENT normocephalic atraumatic no jugular venous distention size 6 distal cuffless XLT in place his phonation is actually a little stronger today compared to last week Pulmonary: Diminished bases no accessory use however does desaturate with activity. This requires titration up of oxygen Cardiac: Regular rate and rhythm Abdomen: Soft nontender PEG unremarkable Extremities: Warm dry brisk cap refill Neuro: Intact. He is profoundly weak.   Resolved Hospital Problem list   Tachycardia: resolved Right pneumothorax > resolved CAP stopped 12/17 Acute on chronic hypoxic respiratory failure Resolved ventilator dependence  Assessment & Plan:  Chronic hypoxic respiratory failure in the setting of Covid related pulmonary fibrosis and severe deconditioning Tracheostomy dependence He is liberated from the ventilator.  We have changed him to a cuffless size  6, he does not need XLT sizing His biggest challenge at this point is really his profound deconditioning, superimposed on his underlying fibrotic injuries.  I think he will always be oxygen dependent but I  am not convinced he is going to require a trach indefinitely, unfortunately when he needs most is aggressive rehabilitation, at this point his oxygen requirements are a barrier to this.  Could consider LTAC setting again, however I think both he and his wife are pretty soured on this after his last experience there Plan We will continue aggressive physical therapy I think it is fine to titrate supplemental oxygen as high as 50% with activity, unfortunately he has to be less than 35% to be a CIR candidate, I am not sure that is something he can achieve, he still desaturates to the 70s with activity Continue bronchial hygiene His second Covid vaccine is due after 1/12 I will order a size 6 regular Shiley, he does not need XLT if he is not ventilated, I think he may eventually be able to be decannulated however I think this is going to take some time .  Stenotrophomonas in sputum: Likely colonizer Plan Monitor  Anxiety. Plan As needed Ativan  DM2. Plan Sliding scale insulin  History of lymphoma> treated in spring 2021 prior to COVID, no assessment since then Plan Follow-up onc as outpatient  Severe deconditioning Had not received PT for several months but feel he can rebound eventually Has recommendations for CIR, the consult was placed, I think is limiting factor here is good to be is activity related oxygen requirements I think if CIR is not an option I wonder if you would consider LTAC or if he did be LTAC appropriate again Plan Continue PT/OT  Dysphagia swallow evaluation completed on 12/29 he did desaturate but this was more activity related and not related to penetration/aspiration. His endurance remains his primary barrier Plan Continue PEG feeds for primary mode of nutrition Okay to start p.o. intake for pleasure, under staff supervision  Best practice (evaluated daily)  Per primary    Erick Colace ACNP-BC [place] Pager # [phone] OR #  [phone] if no answer

## 2020-04-08 NOTE — Progress Notes (Signed)
Inpatient Rehabilitation Admissions Coordinator  Noted great progress with therapy.  O2 desaturation limiting factor with activity progression. I will follow his progress and am hopeful he can be considered for CIR admit next week. I will not begin insurance authorization yet as I await further progress before attempting authorization . Please call me with any questions.  Danne Baxter, RN, MSN Rehab Admissions Coordinator [phone] 04/08/2020 5:04 PM

## 2020-04-08 NOTE — Progress Notes (Signed)
Occupational Therapy Progress Notes   Patient progressing well with acute OT goals updated. Pt transfer to recliner chair with eva walker and mod A x2 for safety/equipment management. Patient does desat to high 60s on 40% FiO2 10L trach collar. Patient continues to be very motivated to progress with mobility and self care.    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 S/p chest tube and Tracheostomy Exchange 12/13. Patient on trach collar/vent PRN.  Precautions  Precautions Fall  Precaution Comments trach, PEG tube, has a PMV( do not have it in place when mobilizing), needs his sneakers brought in.Per Cindee Lame, NP, ok to increase to 50% TC  Splint/Cast prevalon boots  Pain Assessment  Pain Assessment No/denies pain  Cognition  Arousal/Alertness Awake/alert  Behavior During Therapy Anxious  Overall Cognitive Status Within Functional Limits for tasks assessed  General Comments anxious when SOB  ADL  Overall ADL's  Needs assistance/impaired  Toilet Transfer Moderate assistance;+2 for safety/equipment;Stand-pivot;BSC (eva walker)  Toilet Transfer Details (indicate cue type and reason) to recliner, foot drop L > R and desats to high 60s on 40% FiO2 TC/10L  Bed Mobility  Overal bed mobility Needs Assistance  Bed Mobility Supine to Sit  Supine to sit Supervision  General bed  mobility comments increased time  Balance  Overall balance assessment Needs assistance  Sitting-balance support Bilateral upper extremity supported  Sitting balance-Leahy Scale Poor  Sitting balance - Comments supports self with UE's.  Standing balance support Bilateral upper extremity supported  Standing balance-Leahy Scale Poor  Standing balance comment reliant on support  Transfers  Overall transfer level Needs assistance  Equipment used Bilateral platform walker ((eva))  Transfers Sit to/from Stand  Sit to Stand +2 safety/equipment;Mod assist  General transfer comment Mod assist of 2 for safety. took  ~ 4 steps forward then backward to recliner, Noted L foot drop > right. Patient on 40% TC/10 L. SPO2 dropping to 64 %, RR in 40's.  OT - End of Session  Equipment Utilized During Treatment Oxygen;Other (comment);Gait belt (eva walker)  Activity Tolerance Patient tolerated treatment well  Patient left in chair;with call bell/phone within reach  Nurse Communication Other (comment) (RN present for treatment)  OT Assessment/Plan  OT Plan Discharge plan needs to be updated  OT Visit Diagnosis Other abnormalities of gait and mobility (R26.89);Muscle weakness (generalized) (M62.81)  OT Frequency (ACUTE ONLY) Min 2X/week  Follow Up Recommendations CIR  OT Equipment 3 in 1 bedside commode  AM-PAC OT "6 Clicks" Daily Activity Outcome Measure (Version 2)  Help from another person eating meals? 1 (peg tube)  Help from another person taking care of personal grooming? 3  Help from another person toileting, which includes using toliet, bedpan, or urinal? 2  Help from another person bathing (including washing, rinsing, drying)? 2  Help from another person to put on and taking off regular upper body clothing? 3  Help from another person to put on and taking off regular lower  body clothing? 1  6 Click Score 12  OT Goal Progression  Progress towards OT goals Goals met and updated - see care plan   Acute Rehab OT Goals  Patient Stated Goal to get OOB more  OT Goal Formulation With patient  Time For Goal Achievement 04/22/20  Potential to Achieve Goals Good  ADL Goals  Additional ADL Goal #1 Patient will require min A x1 for functional transfers  Additional ADL Goal #2 Patient will sit up EOB at supervision level for 15 minutes in order to participate in self care tasks.  Additional ADL Goal #3 Patient will wash UB seated EOB at supervision level in order to reduce caregiver burden.  OT Time Calculation  OT Start Time (ACUTE ONLY) 1001  OT Stop Time (ACUTE ONLY) 1024  OT Time Calculation (min) 23 min  OT General Charges  $OT Visit 1 Visit  OT Treatments  $Self Care/Home Management  8-22 mins   [name] OT OT pager: [phone]

## 2020-04-08 NOTE — Progress Notes (Signed)
Modified Barium Swallow Progress Note  Patient Details  Name: Steve [name] MRN: [id number] Date of Birth: 10-Jun-1955  Today's Date: 04/08/2020  Modified Barium Swallow completed.  Full report located under Chart Review in the Imaging Section.  Brief recommendations include the following:  Clinical Impression  Patient presents with mild oropharyngeal dysphagia c/b weakness resulting in decreased laryngeal closure and epiglottic deflection with resultant pharyngeal retention that mixed with secretions. No penetration or aspiration of all po observed - however as pt's RR incr and O2 sats decreased, he will be at increased risk of more than a few bites/sips.  Pt was not tested with PMSV as within a few minutes of placement, pt desated and reported difficulty breathing.  Upon removal of valve, did not note breath stacking/CO2 retention. Re: pharyngeal residuals Pt did not sense residuals in pharynx but was able to swallow to help decrease.  Poor mastication abilities as pt did not have dentures in place - and with his WOB and deconditioning, doubtful he will tolerate solids.  Due to laryngeal elevation deficits - pt must be as upright for po intake.  No coughing or throat clearing noted during MBS, however following - pt with coughing after he was moved into bed.  Of note, pt's desaturated during MBS to lowest 82% and RR increased to 30's.  Test was expedited rapidly due to desaturations.  Recommend intake from floor stock provided by RN only after suctioning completed and use tube feeding for nutrition. SLP will follow up for dysphagia management/strengthening of laryngeal closure/epiglottic deflection to decrease pharyngeal retention and maximize airway protection.   Swallow Evaluation Recommendations       SLP Diet Recommendations: Dysphagia 1 (Puree) solids;Thin liquid- floor stock only!    Liquid Administration via: Cup;Straw   Medication Administration: Via alternative means    Supervision: Staff to assist with self feeding--- FULL SUPERVISION FROM RN   Compensations: Multiple dry swallows after each bite/sip;Other (Comment) (consider suction before po intake)   Postural Changes: Remain semi-upright after after feeds/meals (Comment);Seated upright at 90 degrees   Oral Care Recommendations: Oral care QID       Steve Lime, MS [facility] SLP Acute Rehab Services Office [phone] Pager [phone]   Steve [name] 04/08/2020,9:32 AM

## 2020-04-08 NOTE — Progress Notes (Signed)
PROGRESS NOTE    Steve [name]  FXT:[id number] DOB: 1956/02/20 DOA: 03/23/2020 PCP: [name], PA-C   Chief Complaint  Patient presents with  . Cough  . 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   Assessment & Plan:   Active Problems:   Acute on chronic respiratory failure ([name])   Status post tracheostomy ([place])   On mechanically assisted ventilation ([name])   Pneumothorax on right   Tracheostomy care [facility])  #1 acute on chronic hypoxic respiratory failure in the setting of Covid related pulmonary fibrosis and severe deconditioning/tracheostomy dependence Patient was admitted to the critical care team and transferred to the hospitalist team today 04/08/2020.  Patient currently on ventilator.  Patient has been changed to a cuffless size 6 and per PCCM does not need XLT sizing. Per PCCM greatest challenge at this time is profound deconditioning superimposed on chronic underlying fibrotic lung changes.  Per PCCM patient likely will always be oxygen dependent however does not feel patient will require trach indefinitely and currently needing aggressive rehab and at this point oxygen requirements noted to be a barrier.  Per PCCM okay to titrate supplemental oxygen as high as 50% with activity and noted patient has to be less than 35% to be a CIR candidate.  PCCM feels may need to consider LTAC again.  Continue current bronchial hygiene.  Received first Covid vaccine and due for second Covid vaccine after 04/23/2019.  Per PCCM.  2.  Pneumothorax Resolved.  Per PCCM.  3. Stenotrophomonas in sputum Felt likely secondary to colonization.  Antibiotics have been discontinued.  Follow.  4.   Well-controlled diabetes mellitus type 2 Hemoglobin A1c 5.7 (03/23/2020).  CBG 137 this morning.  On tube feeds.  SSI.  5.  Anxiety Ativan as needed.  6.  History of lymphoma Treated in the spring 2021 prior to Covid.  Outpatient follow-up with oncology.  7.  Severe deconditioning/debility Patient noted not to have received physical therapy in several months but feel he can rebound.  PT OT has assessed the patient and recommending CIR and consult placed.  Due to increasing O2 requirements may be a limiting factor.  If CR not an option per PCCM LTAC may be appropriate.  Continue PT/ OT.  Consult with TOC for LTAC in case patient gets denied CIR due to O2 requirements.  8.  Dysphagia Patient noted to desat with modified barium swallow however felt secondary to deconditioning and not related to aspiration.  Continue tube feeds per PEG tube.  9.  Gastro esophageal reflux disease PPI.   DVT prophylaxis: Lovenox Code Status: Full Family Communication: Updated patient.  No family at bedside. Disposition:   Status is: Inpatient    Dispo: The patient is from: Home              Anticipated d/c is to: CIR hopefully versus LTAC              Anticipated d/c date is: 2 to 3 days              Patient currently not medically stable for discharge.  Still with some desats with increasing O2 requirements       Consultants:   PCCM admission  Wound care   [facility] Events   December 13 admission, chest tube placed, trache exchanged 12/15 trach changed to #8 shiley, worsening cuff leak, #6 XLT distal put back in  12/16 trach changed to #8 distal XLT, no cuff leak, removed chest tube 12/20 placed on home vent 12/22 started ATC trials. Did send sputum as had some blood tinge. Having trouble getting home health assistance. 12/23 working on [employer]. Working on [employer]. Plan to change vent to PRN.  12/24 tolerated off vent 12/28 changed to cuffless trach, remains off ventilator 12/29  swallow evaluation completed. No witnessed aspiration but did desaturate due to endurance and deconditioning  Micro Data:  December 13 SARS-CoV-2/influenza negative December 13  MRSA PCR  Neg  December 13 respiratory culture > few gram negative rods>> diptheroids    Procedures:  Tracheostomy present prior to admission 9/13 right chest tube > 9/16  Significant diagnostic tests September 13 chest x-ray showed right-sided pneumothorax, chronic fibrotic changes, tracheostomy in place Bronchoscopy 12/13> no clear tracheal injury  Antimicrobials:  Ceftriaxone   12/13 > 12/17 Levaquin 12/26 > 12/27   Subjective: Patient sleeping but arousable.  Denies any chest pain.  Denies any significant shortness of breath.  Noted to desaturate with activity.  Underwent modified barium swallow this morning.  To be started on a dysphagia 1 diet per SLP.  Currently off ventilator.  Patient down to a 6 cuffless trach and noted to desat during swallow evaluation but felt secondary to endurance with no witnessed aspiration.  Objective: Vitals:   04/08/20 0700 04/08/20 0800 04/08/20 0900 04/08/20 1000  BP: 106/69 140/83 (!) 108/58 97/62  Pulse: 87 (!) 111 (!) 101 92  Resp: (!) 24 (!) 46 (!) 27 (!) 25  Temp:  98.6 F (37 C)    TempSrc:  Oral    SpO2: 97% (!) 88% 91% 98%  Weight:      Height:        Intake/Output Summary (Last 24 hours) at 04/08/2020 1019 Last data filed at 04/08/2020 4132 Gross per 24 hour  Intake 1430 ml  Output 1400 ml  Net 30 ml   Filed Weights   04/04/20 0417 04/07/20 0500 04/08/20 0500  Weight: 60.5 kg 60.4 kg 60.3 kg    Examination:  General exam: Appears calm and comfortable.  Respiratory system: Some decreased breath sounds in the bases.  Clear to auscultation anterior lung fields.  Tracheostomy intact.  Cardiovascular system: S1 & S2 heard, RRR. No JVD, murmurs, rubs, gallops or clicks. No pedal edema. Gastrointestinal system: Abdomen is nondistended, soft and  nontender. No organomegaly or masses felt. Normal bowel sounds heard.  PEG tube in place. Central nervous system: Alert and oriented. No focal neurological deficits. Extremities: Symmetric 5 x 5 power. Skin: No rashes, lesions or ulcers Psychiatry: Judgement and insight appear normal. Mood & affect appropriate.     Data Reviewed: I have personally reviewed following labs and imaging studies  CBC: Recent Labs  Lab 04/05/20 0502 04/07/20 0505  WBC 9.8 9.7  HGB 9.8* 10.1*  HCT 31.4* 32.4*  MCV 91.5 92.3  PLT 267 440    Basic Metabolic Panel: Recent Labs  Lab 04/03/20 0407 04/05/20 0502 04/06/20 0439 04/07/20 0505  NA 137 138  --  137  K 4.3 4.3  --  4.3  CL 96* 96*  --  97*  CO2 32 33*  --  30  GLUCOSE 136* 125*  --  154*  BUN 24* 25*  --  24*  CREATININE 0.50* 0.56* 0.67 0.57*  CALCIUM 9.0 9.3  --  9.1  MG 2.0  --   --  1.8  PHOS  --   --   --  3.6    GFR: Estimated Creatinine Clearance: 79.6 mL/min (A) (by C-G formula based on SCr of 0.57 mg/dL (L)).  Liver Function Tests: Recent Labs  Lab 04/03/20 0407  AST 19  ALT 21  ALKPHOS 64  BILITOT 0.3  PROT 7.9  ALBUMIN 3.0*    CBG: Recent Labs  Lab 04/07/20 0814 04/07/20 1148 04/07/20 1614 04/07/20 2227 04/08/20 0756  GLUCAP 149* 111* 91 130* 137*     Recent Results (from the past 240 hour(s))  Culture, respiratory (non-expectorated)     Status: None   Collection Time: 04/01/20  3:40 PM   Specimen: Tracheal Aspirate; Respiratory  Result Value Ref Range Status   Specimen Description   Final    TRACHEAL ASPIRATE Performed at [facility] [address] [place], [place] [address]    Special Requests   Final    NONE Performed at [facility], [place] [address] [place], [place] [address]    Gram Stain   Final    ABUNDANT WBC PRESENT, PREDOMINANTLY PMN ABUNDANT GRAM POSITIVE RODS Performed at [facility], [place] [address] [place], [place] [address]     Culture FEW STENOTROPHOMONAS MALTOPHILIA  Final   Report Status 04/04/2020 FINAL  Final   Organism ID, Bacteria STENOTROPHOMONAS MALTOPHILIA  Final      Susceptibility   Stenotrophomonas maltophilia - MIC*    LEVOFLOXACIN 2 SENSITIVE Sensitive     TRIMETH/SULFA 80 RESISTANT Resistant     * FEW STENOTROPHOMONAS MALTOPHILIA         Radiology Studies: DG Chest Port 1 View  Result Date: 04/07/2020 CLINICAL DATA:  Respiratory failure EXAM: PORTABLE CHEST 1 VIEW COMPARISON:  03/31/2020, CT 03/24/2020 FINDINGS: Tracheostomy is unchanged. Lung volumes are small and pulmonary insufflation has diminished since prior examination. Superimposed extensive peripheral pulmonary infiltrate is again identified, related to underlying interstitial lung disease and architectural distortion noted on prior CT of 03/24/2020, and is likely stable when accounting for diminished pulmonary insufflation. No superimposed focal consolidation. No pneumothorax or pleural effusion. Cardiac size is mildly enlarged, unchanged. Left internal jugular chest port is unchanged with its tip within the right atrium. Right paratracheal soft tissue thickening relates to mediastinal adenopathy, better seen on a prior CT examination. No acute bone abnormality. IMPRESSION: Progressive pulmonary hypoinflation. Stable pulmonary infiltrates related to underlying interstitial lung disease. Stable right paratracheal soft tissue thickening related to underlying known mediastinal adenopathy. Electronically Signed   By: Fidela Salisbury MD   On: 04/07/2020 05:06   DG Swallowing Func-Speech Pathology  Result Date: 04/08/2020 Objective Swallowing Evaluation: Type of Study: MBS-Modified Barium Swallow Study  Patient Details Name: Steve [name] MRN: [id number] Date of Birth: [birth year] Today's Date: 04/08/2020 Time: SLP Start Time (ACUTE ONLY): 0831 -SLP Stop Time (ACUTE ONLY): 0849 SLP Time Calculation (min) (ACUTE ONLY): 18 min Past Medical History:  Past Medical History: Diagnosis Date . Abscess of muscle 08/10/2011  staph infection of right hip  . Acute on chronic respiratory failure with hypoxia ([place])  . Acute respiratory distress syndrome (ARDS) due to COVID-19 virus ([place])  . COVID-19 virus infection  . Diabetes mellitus without complication ([place])  . GERD (gastroesophageal reflux disease)   rare reflux - no meds for reflux - NO PROBLEM IN PAST SEVERAL YRS . HCAP (  healthcare-associated pneumonia)  . Healthcare-associated pneumonia  . Hip dysplasia, congenital   no surgery as a child for hip dysplasia - has had bilateral hip replacements as an adult . Hodgkin lymphoma ([place])  . Hodgkin's lymphoma ([place])  . Hypertension  . Pancreatitis  . Pneumothorax, acute  . Postoperative anemia due to acute blood loss 09/07/2012 . Septic arthritis of hip ([place]) 09/05/2012  PT'S TOTAL HIP JOINT REMOVED - ANTIBIOTIC SPACE PLACED AND PT HAS FINISHED IV ANTIBIOTICS ( PICC LINE REMOVED) . Sleep apnea   USES CPAP Past Surgical History: Past Surgical History: Procedure Laterality Date . COLONOSCOPY  04/26/2007 . HERNIA REPAIR    inguinal hernia x3 . IR GASTROSTOMY TUBE MOD SED  08/22/2019 . IR IMAGING GUIDED PORT INSERTION  03/29/2019 . JOINT REPLACEMENT  2002 & 2007  bilateral hip replacement . LYMPH NODE BIOPSY Right 03/20/2019  Procedure: DEEP RIGHT SUPRACLAVICULAR LYMPH NODE EXCISION;  Surgeon: Fanny Skates, MD;  Location: [facility];  Service: General;  Laterality: Right; . MULTIPLE EXTRACTIONS WITH ALVEOLOPLASTY  07/28/2011  Procedure: MULTIPLE EXTRACION WITH ALVEOLOPLASTY;  Surgeon: Lenn Cal, DDS;  Location: [facility];  Service: Oral Surgery;  Laterality: N/A;  Extraction of tooth #'s 2,3,4,5,6,11,12,13,15,19,22 with alveoloplasty. . shoulder repair - right for separation of shoulder   . TEE WITHOUT CARDIOVERSION  07/29/2011  Procedure: TRANSESOPHAGEAL ECHOCARDIOGRAM (TEE);  Surgeon: Josue Hector, MD;  Location: [facility];  Service: Cardiovascular;   Laterality: N/A; . TOTAL HIP REVISION Right 09/05/2012  Procedure: RIGHT HIP RESECTION ARTHROPLASTY WITH ANTIBIOTIC SPACERS;  Surgeon: [name], MD;  Location: [facility];  Service: Orthopedics;  Laterality: Right; . 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 S/p chest tube and Tracheostomy Exchange 12/13.  PEG.  No record of prior swallow or PMV evaluations. Pt originally had trach placed in April 2021 after admission for severe acute respiratory failure with hypoxemia in the setting of receiving chemotherapy for Hodgkin's lymphoma.  Pt now on trach collar - 10 L 40% and uses home vent at night.  CXR Progressive pulmonary hypoinflation.     Stable pulmonary infiltrates related to underlying interstitial lung  disease.     Stable right paratracheal soft tissue thickening related to  underlying known mediastinal adenopathy.[name] changed yesterday to Shiley #6 XL.  MBS indicated to assess for po readiness.  Pt with h/o dysphagia and had Gtube placed 09/09/2019  Subjective: alert, desires ativan but approved to wait until after mbs, wanted suctioned- advised rn and RT Assessment / Plan / Recommendation CHL IP CLINICAL IMPRESSIONS 04/08/2020 Clinical Impression Patient presents with mild oropharyngeal dysphagia c/b weakness resulting in decreased laryngeal closure and epiglottic deflection with resultant pharyngeal retention that mixed with secretions. No penetration or aspiration of all po observed - however as pt's RR incr and O2 sats decreased, he will be at  increased risk of more than a few bites/sips.  Pt was not tested with PMSV as within a few minutes of placement, pt desated and reported difficulty breathing.  Upon removal of valve, did not note breath stacking/CO2 retention.  Re: pharyngeal residuals Pt did not sense residuals in pharynx but was able to swallow to help decrease.  Poor mastication abilities as pt did not have dentures in place - and with his WOB and deconditioning, doubtful he will tolerate solids.  Due to laryngeal elevation deficits - pt must be as upright for po intake.  No coughing or throat clearing noted during MBS, however following - pt with coughing after he was moved into bed.  Of note, pt's desaturated during MBS to lowest 82% and RR increased to 30's.  Test was expedited rapidly due to desaturations.  Recommend intake from floor stock provided by RN only after suctioning completed and use tube feeding for nutrition. SLP will follow up for dysphagia management/strengthening of laryngeal closure/epiglottic deflection to decrease pharyngeal retention and maximize airway protection. SLP Visit Diagnosis Dysphagia, pharyngeal phase (R13.13) Attention and concentration deficit following -- Frontal lobe and executive function deficit following -- Impact on safety and function --   No flowsheet data found.  Prognosis 04/08/2020 Prognosis for Safe Diet Advancement Fair Barriers to Reach Goals Severity of deficits Barriers/Prognosis Comment -- CHL IP DIET RECOMMENDATION 04/08/2020 SLP Diet Recommendations Dysphagia 1 (Puree) solids;Thin liquid Liquid Administration via Cup;Straw Medication Administration Via alternative means Compensations Multiple dry swallows after each bite/sip;Other (Comment) Postural Changes Remain semi-upright after after feeds/meals (Comment);Seated upright at 90 degrees   CHL IP OTHER RECOMMENDATIONS 04/08/2020 Recommended Consults -- Oral Care Recommendations Oral care QID Other Recommendations --   CHL IP FOLLOW UP  RECOMMENDATIONS 04/08/2020 Follow up Recommendations Inpatient Rehab   CHL IP FREQUENCY AND DURATION 04/08/2020 Speech Therapy Frequency (ACUTE ONLY) min 2x/week Treatment Duration 2 weeks      CHL IP ORAL PHASE 04/08/2020 Oral Phase Impaired Oral - Pudding Teaspoon -- Oral - Pudding Cup -- Oral - Honey Teaspoon -- Oral - Honey Cup -- Oral - Nectar Teaspoon WFL;Piecemeal swallowing Oral - Nectar Cup -- Oral - Nectar Straw -- Oral - Thin Teaspoon WFL;Piecemeal swallowing Oral - Thin Cup [facility];Piecemeal swallowing Oral - Thin Straw WFL;Piecemeal swallowing Oral - Puree Premature spillage;Piecemeal swallowing Oral - Mech Soft Impaired mastication;Weak lingual manipulation;Piecemeal swallowing Oral - Regular -- Oral - Multi-Consistency -- Oral - Pill -- Oral Phase - Comment --  CHL IP PHARYNGEAL PHASE 04/08/2020 Pharyngeal Phase Impaired Pharyngeal- Pudding Teaspoon -- Pharyngeal -- Pharyngeal- Pudding Cup -- Pharyngeal -- Pharyngeal- Honey Teaspoon -- Pharyngeal -- Pharyngeal- Honey Cup -- Pharyngeal -- Pharyngeal- Nectar Teaspoon Pharyngeal residue - valleculae;Pharyngeal residue - pyriform;Reduced laryngeal elevation Pharyngeal Material does not enter airway Pharyngeal- Nectar Cup -- Pharyngeal -- Pharyngeal- Nectar Straw Pharyngeal residue - valleculae;Pharyngeal residue - pyriform;Reduced laryngeal elevation Pharyngeal -- Pharyngeal- Thin Teaspoon Reduced laryngeal elevation;Pharyngeal residue - valleculae;Pharyngeal residue - pyriform Pharyngeal Material does not enter airway Pharyngeal- Thin Cup Pharyngeal residue - valleculae;Pharyngeal residue - pyriform;Reduced laryngeal elevation Pharyngeal Material does not enter airway Pharyngeal- Thin Straw Pharyngeal residue - valleculae;Pharyngeal residue - pyriform;Reduced laryngeal elevation Pharyngeal Material does not enter airway Pharyngeal- Puree Reduced laryngeal elevation;Pharyngeal residue - valleculae Pharyngeal Material does not enter airway Pharyngeal-  Mechanical Soft Reduced tongue base retraction;Pharyngeal residue - valleculae Pharyngeal Material does not enter airway Pharyngeal- Regular -- Pharyngeal -- Pharyngeal- Multi-consistency -- Pharyngeal -- Pharyngeal- Pill -- Pharyngeal -- Pharyngeal Comment --  CHL IP CERVICAL ESOPHAGEAL PHASE 04/08/2020 Cervical Esophageal Phase WFL Pudding Teaspoon -- Pudding Cup -- Honey Teaspoon -- Honey Cup -- Nectar Teaspoon -- Nectar Cup -- Nectar Straw -- Thin Teaspoon -- Thin Cup -- Thin Straw -- Puree --  Mechanical Soft -- Regular -- Multi-consistency -- Pill -- Cervical Esophageal Comment -- Kathleen Lime, MS [facility] SLP Acute Rehab Services Office [phone] Pager [phone] [name] 04/08/2020, 9:34 AM                   Scheduled Meds: . chlorhexidine  15 mL Mouth Rinse BID  . Chlorhexidine Gluconate Cloth  6 each Topical Daily  . enoxaparin (LOVENOX) injection  40 mg Subcutaneous Q24H  . feeding supplement (PROSource TF)  45 mL Per Tube TID  . free water  100 mL Per Tube Q4H  . insulin aspart  0-15 Units Subcutaneous TID WC  . insulin aspart  0-5 Units Subcutaneous QHS  . mouth rinse  15 mL Mouth Rinse q12n4p  . pantoprazole sodium  40 mg Per Tube Daily  . sertraline  50 mg Per Tube Daily  . sodium chloride flush  10-40 mL Intracatheter Q12H   Continuous Infusions: . feeding supplement (OSMOLITE 1.2 CAL) 65 mL/hr at 04/08/20 0400     LOS: 16 days    Time spent: 40 minutes    Irine Seal, MD [facility]   To contact the attending provider between 7A-7P or the covering provider during after hours 7P-7A, please log into the web site [email or web address] and access using universal [place] password for that web site. If you do not have the password, please call the hospital operator.  04/08/2020, 10:19 AM

## 2020-04-09 DIAGNOSIS — R5381 Other malaise: Secondary | ICD-10-CM | POA: Diagnosis not present

## 2020-04-09 DIAGNOSIS — J939 Pneumothorax, unspecified: Secondary | ICD-10-CM | POA: Diagnosis not present

## 2020-04-09 DIAGNOSIS — J9621 Acute and chronic respiratory failure with hypoxia: Secondary | ICD-10-CM | POA: Diagnosis not present

## 2020-04-09 DIAGNOSIS — F419 Anxiety disorder, unspecified: Secondary | ICD-10-CM | POA: Diagnosis not present

## 2020-04-09 DIAGNOSIS — Z9911 Dependence on respirator [ventilator] status: Secondary | ICD-10-CM | POA: Diagnosis not present

## 2020-04-09 LAB — BASIC METABOLIC PANEL
Anion gap: 9 (ref 5–15)
BUN: 26 mg/dL — ABNORMAL HIGH (ref 8–23)
CO2: 33 mmol/L — ABNORMAL HIGH (ref 22–32)
Calcium: 9.2 mg/dL (ref 8.9–10.3)
Chloride: 96 mmol/L — ABNORMAL LOW (ref 98–111)
Creatinine, Ser: 0.58 mg/dL — ABNORMAL LOW (ref 0.61–1.24)
GFR, Estimated: >60 mL/min (ref >60)
Glucose, Bld: 105 mg/dL — ABNORMAL HIGH (ref 70–99)
Potassium: 4 mmol/L (ref 3.5–5.1)
Sodium: 138 mmol/L (ref 135–145)

## 2020-04-09 LAB — GLUCOSE, CAPILLARY
Glucose-Capillary: 153 mg/dL — ABNORMAL HIGH (ref 70–99)
Glucose-Capillary: 154 mg/dL — ABNORMAL HIGH (ref 70–99)
Glucose-Capillary: 110 mg/dL — ABNORMAL HIGH (ref 70–99)
Glucose-Capillary: 114 mg/dL — ABNORMAL HIGH (ref 70–99)

## 2020-04-09 LAB — CBC WITH DIFFERENTIAL/PLATELET
Abs Immature Granulocytes: 0.03 10*3/uL (ref 0.00–0.07)
Basophils Absolute: 0 10*3/uL (ref 0.0–0.1)
Basophils Relative: 1 %
Eosinophils Absolute: 0.3 10*3/uL (ref 0.0–0.5)
Eosinophils Relative: 3 %
HCT: 31.9 % — ABNORMAL LOW (ref 39.0–52.0)
Hemoglobin: 9.9 g/dL — ABNORMAL LOW (ref 13.0–17.0)
Immature Granulocytes: 0 %
Lymphocytes Relative: 29 %
Lymphs Abs: 2.2 10*3/uL (ref 0.7–4.0)
MCH: 29.1 pg (ref 26.0–34.0)
MCHC: 31 g/dL (ref 30.0–36.0)
MCV: 93.8 fL (ref 80.0–100.0)
Monocytes Absolute: 1.2 10*3/uL — ABNORMAL HIGH (ref 0.1–1.0)
Monocytes Relative: 16 %
Neutro Abs: 3.8 10*3/uL (ref 1.7–7.7)
Neutrophils Relative %: 51 %
Platelets: 256 10*3/uL (ref 150–400)
RBC: 3.4 MIL/uL — ABNORMAL LOW (ref 4.22–5.81)
RDW: 14.3 % (ref 11.5–15.5)
WBC: 7.4 10*3/uL (ref 4.0–10.5)
nRBC: 0 % (ref 0.0–0.2)

## 2020-04-09 MED ORDER — FUROSEMIDE 10 MG/ML IJ SOLN
40.0000 mg | Freq: Once | INTRAMUSCULAR | Status: AC
  Administered 2020-04-09: 11:00:00 40 mg via INTRAVENOUS
  Filled 2020-04-09: qty 4

## 2020-04-09 NOTE — Progress Notes (Signed)
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  Baseline = bed bound, able to sit on side of bed only/ wife main caretaker  Past Medical History  Sleep apnea, on CPAP at home Post Covid pneumonia pulmonary fibrosis, on mechanical ventilation Chronic respiratory failure with hypoxemia on mechanical ventilation at home Hodgkin's lymphoma Hypertension History of pancreatitis Gastroesophageal reflux disease Diabetes mellitus type 2  Significant Hospital Events   December 13 admission, chest tube placed, trache exchanged 12/15 trach changed to #8 shiley, worsening cuff leak, #6 XLT distal put back in  12/16 trach changed to #8 distal XLT, no cuff leak, removed chest tube 12/20 placed on home vent 12/22 started ATC trials. Did send sputum as had some blood tinge. Having trouble getting home health assistance. 12/23 working on [employer]. Working on [employer]. Plan to change vent to PRN.  12/24 tolerated off vent 12/28 changed to cuffless trach, remains off ventilator 12/29 swallow evaluation completed. No witnessed aspiration but did desaturate due to endurance and deconditioning Change to size 6 regular cuffless Consults:    Procedures:  Tracheostomy present prior to admission 9/13 right chest tube > 9/16  Significant Diagnostic Tests:  September 13 chest x-ray showed right-sided pneumothorax, chronic fibrotic changes, tracheostomy in place Bronchoscopy 12/13> no clear tracheal injury  Micro Data:  December 13 SARS-CoV-2/influenza negative December 13  MRSA PCR  Neg   December 13 respiratory culture > few gram negative rods>> diptheroids   Antimicrobials:  Ceftriaxone   12/13 > 12/17 Levaquin 12/26 > 12/27  Interim history/subjective:  No complaints today doing well voice quality improving  Objective   Blood pressure 106/67, pulse 92, temperature 98.5 F (36.9 C), temperature source Oral, resp. rate (Abnormal) 28, height 5\' 5"  (1.651 m), weight 60.7 kg, SpO2 96 %.    FiO2 (%):  [40 %] 40 %   Intake/Output Summary (Last 24 hours) at 04/09/2020 0904 Last data filed at 04/09/2020 0000 Gross per 24 hour  Intake 1310 ml  Output 250 ml  Net 1060 ml   Filed Weights   04/07/20 0500 04/08/20 0500 04/09/20 0322  Weight: 60.4 kg 60.3 kg 60.7 kg    Examination:   General 64 year old white male he is resting in bed currently in no acute distress HEENT normocephalic atraumatic size 6 cuffless tracheostomy in place his phonation quality is has improved daily, still has a hoarse raspy voice however vocal support and effort has improved Pulmonary diminished bases no accessory use currently 40% aerosol trach collar Cardiac: Regular rate and rhythm Abdomen: Soft not tender no organomegaly Extremities: Warm dry brisk cap refill Neuro: Awake oriented no focal deficits.  Resolved Hospital Problem list   Tachycardia: resolved Right pneumothorax > resolved CAP stopped 12/17 Acute on chronic hypoxic respiratory failure Resolved ventilator dependence  Assessment & Plan:  Chronic hypoxic respiratory failure in the setting of Covid related pulmonary fibrosis and severe deconditioning Tracheostomy dependence Stenotrophomonas in sputum: Likely colonizer Anxiety. DM2. History of lymphoma Severe deconditioning Dysphagia    Discussion  [name] now has a size 6, his phonation quality is  improving daily, his overall respiratory endurance is slowly improving, I do think he will be oxygen dependent lifelong, I do think his Covid related fibrosis will impact his  life lifelong.  I am hopeful however as he continues to improve his endurance his episodes of desaturation may improve some with time.  He has at least proven he does not need the ventilator, the big challenge here will to be avoid frequent bouts of hypoxia during activity.  Plan Continue supplemental oxygen Titrate to maintain saturations greater than 88% Routine trach care Not immediately a candidate for decannulation, would keep this current size 6 trach in place.  Once he gets to a point where he is more ambulatory, can sit up in bed on his own power, and demonstrates better endurance I think we could downsize him to a size 4, he may eventually be able to be decannulated Continue to encourage PMV as much as tolerated Working towards disposition, he might best be served by [employer] however I am not sure he would be a candidate for that, his oxygen requirements are going to be a obstacle for [employer] practice (evaluated daily)  Per primary    Steve [name] ACNP-BC [place] Pager # [phone] OR # [phone] if no answer

## 2020-04-09 NOTE — TOC Progression Note (Signed)
Transition of Care [facility]) - Progression Note    Patient Details  Name: Steve [name] MRN: [id number] Date of Birth: 05/16/1955  Transition of Care [facility]) CM/SW Contact  Leeroy Cha, RN Phone Number: 04/09/2020, 8:06 AM  Clinical Narrative:     Patient being evaluated by CIR for placment maybe next week,continues on trach collar at 40%Fi02  Expected Discharge Plan: [place] Barriers to Discharge: No Barriers Identified  Expected Discharge Plan and Services Expected Discharge Plan: [place]   Discharge Planning Services: CM Consult   Living arrangements for the past 2 months: Mobile Home                                       Social Determinants of Health (SDOH) Interventions    Readmission Risk Interventions Readmission Risk Prevention Plan 07/22/2019  Transportation Screening Complete  Medication Review (RN Care Manager) Complete  Some recent data might be hidden

## 2020-04-09 NOTE — Progress Notes (Signed)
Speech Language Pathology Treatment: Steve [name] Speaking valve  Patient Details Name: Steve [name] MRN: [id number] DOB: 02/18/1956 Today's Date: 04/09/2020 Time: 5427-0623 SLP Time Calculation (min) (ACUTE ONLY): 33 min  Assessment / Plan / Recommendation Clinical Impression  SLP notified by another SLP that RN questioned PMSV use with new trach, therefore SLP followed up with pt.  Pt was being suctioned upon SLP entrance to room,   Viscous secretion removed.  After allotment of time for respiratory status to calm - pt voicing around the trach!  Pt agreeable to try PMSV for communication.  Within 30 seconds, pt began coughing and advised discomfort, therefore SLP removed valve momentarily.  When placed again, cued pt to clear his throat in attempt to transition to phonating.  Excessive throat clearing noted which resulted in pt's oxygen saturation decreasing to 80s and RR to 44 with respiratory discomfort.  SLP removed valve and pt required several minutes to recover to baseline.    SLP had obtained soda and applesauce for po trials, but pt declined after PMSV trial due to fatigue.  Requested pt continue to try po to decrease disuse muscle atrophy and to help enhance oral care/hygiene.    Informed pt of importance of oral care to decrease aspiration pna risk especially given lack of po.    Since pt is able to voice around the trach without the valve and his goal is to improve endurance with po - recommend defer PMSV trials until next week *new trach placed yesterday* and focus on providing pt with po intake with RN only - multiple times a day as tolerated.    SLP obtained communication boards for pt to use for efficient communication especially for emergent purposes.  He reviewed these boards with SLP present.   Pt's greatest aspiration risk is his deconditioning and Respiratory status.  Will continue treatment with pt to maximize his communication and swallowing abilities.     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 S/p chest tube and Tracheostomy Exchange 12/13.  PEG.  No record of prior swallow or PMV evaluations. Pt originally had trach placed in April 2021 after admission for severe acute respiratory failure with hypoxemia in the setting of receiving chemotherapy for Hodgkin's lymphoma.  Pt now on trach collar - 10 L 40% and uses home vent at night.  CXR Progressive pulmonary hypoinflation.     Stable pulmonary infiltrates related to underlying interstitial lung  disease.     Stable right paratracheal soft tissue thickening related to  underlying known mediastinal adenopathy.[name] changed yesterday to Shiley #6 XL.  MBS indicated to assess for po readiness.  Pt with h/o dysphagia and had Gtube placed 09/09/2019      SLP Plan  Continue with current plan of care       Recommendations                   SLP Visit Diagnosis: Dysphagia, pharyngeal phase (R13.13);Aphonia (R49.1) Plan: Continue with current plan of care       GO                Steve [name] 04/09/2020, 6:28 PM  Steve Lime, MS [facility] SLP Acute Rehab Services Office  [phone] Pager [phone]

## 2020-04-09 NOTE — Progress Notes (Signed)
PROGRESS NOTE    Steve [name]  YTK:[id number] DOB: [birth year] DOA: 03/23/2020 PCP: [name], PA-C   Chief Complaint  Patient presents with  . Cough  . 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   Assessment & Plan:   Active Problems:   Acute on chronic respiratory failure ([name])   Status post tracheostomy ([place])   On mechanically assisted ventilation ([name])   Pneumothorax on right   Tracheostomy care ([place])   Debility   Anxiety  1 acute on chronic hypoxic respiratory failure in the setting of Covid related pulmonary fibrosis and severe deconditioning/tracheostomy dependence Patient was admitted to the critical care team and transferred to the hospitalist team today 04/08/2020.  Patient currently on ventilator.  Patient has been changed to a cuffless size 6 and per PCCM does not need XLT sizing. Per PCCM greatest challenge at this time is profound deconditioning superimposed on chronic underlying fibrotic lung changes.  Per PCCM patient likely will always be oxygen dependent however does not feel patient will require trach indefinitely and currently needing aggressive rehab and at this point oxygen requirements noted to be a barrier.  Per PCCM okay to titrate supplemental oxygen as high as 50% with activity and noted patient has to be less than 35% to be a CIR candidate.  PCCM feels may need to consider LTAC again.  Continue current bronchial hygiene.  Received first Covid vaccine and due for second Covid vaccine after 04/23/2019.  We will give a dose of Lasix 40 mg IV x1 today.  Per PCCM.  2.  Pneumothorax Resolved.  Per PCCM.  3. 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  Antibiotics have been discontinued.  Follow.  4.  Well-controlled diabetes mellitus type 2 Hemoglobin A1c 5.7 (03/23/2020).  CBG 153 this morning.  On tube feeds.  Continue SSI.   5.  Anxiety Ativan as needed.  6.  History of lymphoma Treated in the spring 2021 prior to Covid.  Outpatient follow-up with oncology.  7.  Severe deconditioning/debility Patient noted not to have received physical therapy in several months but feel he can rebound.  PT OT has assessed the patient and recommending CIR and consult placed.  Due to increasing O2 requirements may be a limiting factor.  If CIR not an option per PCCM LTAC may be appropriate.  Continue PT/ OT.  TOC consulted for possible LTAC in case patient gets denied for CIR due to O2 requirements.   8.  Dysphagia Patient noted to desat with modified barium swallow however felt secondary to deconditioning and not related to aspiration.  Continue tube feeds per PEG tube.  9.  Gastro esophageal reflux disease Continue PPI.             DVT prophylaxis: Lovenox Code Status: Full Family Communication: Updated patient.  No family at bedside. Disposition:   Status is: Inpatient    Dispo: The patient is from: Home              Anticipated d/c is to: CIR hopefully versus LTAC              Anticipated d/c date is: 2 to 3 days  Patient currently not medically stable for discharge.  Still with some desats with increasing O2 requirements       Consultants:   PCCM admission  Wound care   [place] Events   December 13 admission, chest tube placed, trache exchanged 12/15 trach changed to #8 shiley, worsening cuff leak, #6 XLT distal put back in  12/16 trach changed to #8 distal XLT, no cuff leak, removed chest tube 12/20 placed on home vent 12/22 started ATC trials. Did send sputum as had some blood tinge. Having trouble getting home health assistance. 12/23 working on [employer]. Working on [employer]. Plan  to change vent to PRN.  12/24 tolerated off vent 12/28 changed to cuffless trach, remains off ventilator 12/29 swallow evaluation completed. No witnessed aspiration but did desaturate due to endurance and deconditioning  Micro Data:  December 13 SARS-CoV-2/influenza negative December 13  MRSA PCR  Neg  December 13 respiratory culture > few gram negative rods>> diptheroids    Procedures:  Tracheostomy present prior to admission 9/13 right chest tube > 9/16  Significant diagnostic tests September 13 chest x-ray showed right-sided pneumothorax, chronic fibrotic changes, tracheostomy in place Bronchoscopy 12/13> no clear tracheal injury  Antimicrobials:  Ceftriaxone   12/13 > 12/17 Levaquin 12/26 > 12/27   Subjective: Patient sleeping but arousable.  Denies any chest pain.  Denies any significant shortness of breath.  Noted to desaturate with activity.  Underwent modified barium swallow this morning.  To be started on a dysphagia 1 diet per SLP.  Currently off ventilator.  Patient down to a 6 cuffless trach and noted to desat during swallow evaluation but felt secondary to endurance with no witnessed aspiration.  Objective: Vitals:   04/09/20 0500 04/09/20 0600 04/09/20 0700 04/09/20 0800  BP: (!) 102/58 114/60 (!) 107/54 106/67  Pulse: 78 95 83 92  Resp: (!) 23 (!) 38 (!) 24 (!) 28  Temp:    98.5 F (36.9 C)  TempSrc:    Oral  SpO2: 100% 95% 99% 96%  Weight:      Height:        Intake/Output Summary (Last 24 hours) at 04/09/2020 0953 Last data filed at 04/09/2020 0000 Gross per 24 hour  Intake 1310 ml  Output 250 ml  Net 1060 ml   Filed Weights   04/07/20 0500 04/08/20 0500 04/09/20 0322  Weight: 60.4 kg 60.3 kg 60.7 kg    Examination:  General exam: Appears calm and comfortable.  Respiratory system: Some decreased breath sounds in the bases.  Clear to auscultation anterior lung fields.  Tracheostomy intact.  Cardiovascular system: S1 & S2 heard, RRR. No JVD,  murmurs, rubs, gallops or clicks. No pedal edema. Gastrointestinal system: Abdomen is nondistended, soft and nontender. No organomegaly or masses felt. Normal bowel sounds heard.  PEG tube in place. Central nervous system: Alert and oriented. No focal neurological deficits. Extremities: Symmetric 5 x 5 power. Skin: No rashes, lesions or ulcers Psychiatry: Judgement and insight appear normal. Mood & affect appropriate.     Data Reviewed: I have personally reviewed following labs and imaging studies  CBC: Recent Labs  Lab 04/05/20 0502 04/07/20 0505 04/08/20 1042 04/09/20 0326  WBC 9.8 9.7 9.0 7.4  NEUTROABS  --   --  5.1 3.8  HGB 9.8* 10.1* 10.3* 9.9*  HCT 31.4* 32.4* 32.8* 31.9*  MCV 91.5 92.3 92.1 93.8  PLT 267 252 242 532    Basic Metabolic Panel: Recent Labs  Lab 04/03/20 0407 04/05/20 0502 04/06/20 0439  04/07/20 0505 04/08/20 1042 04/09/20 0326  NA 137 138  --  137 136 138  K 4.3 4.3  --  4.3 4.1 4.0  CL 96* 96*  --  97* 96* 96*  CO2 32 33*  --  30 31 33*  GLUCOSE 136* 125*  --  154* 125* 105*  BUN 24* 25*  --  24* 26* 26*  CREATININE 0.50* 0.56* 0.67 0.57* 0.67 0.58*  CALCIUM 9.0 9.3  --  9.1 9.2 9.2  MG 2.0  --   --  1.8 1.9  --   PHOS  --   --   --  3.6 3.8  --     GFR: Estimated Creatinine Clearance: 80.1 mL/min (A) (by C-G formula based on SCr of 0.58 mg/dL (L)).  Liver Function Tests: Recent Labs  Lab 04/03/20 0407 04/08/20 1042  AST 19  --   ALT 21  --   ALKPHOS 64  --   BILITOT 0.3  --   PROT 7.9  --   ALBUMIN 3.0* 3.0*    CBG: Recent Labs  Lab 04/08/20 0756 04/08/20 1157 04/08/20 1603 04/08/20 2100 04/09/20 0747  GLUCAP 137* 170* 109* 137* 153*     Recent Results (from the past 240 hour(s))  Culture, respiratory (non-expectorated)     Status: None   Collection Time: 04/01/20  3:40 PM   Specimen: Tracheal Aspirate; Respiratory  Result Value Ref Range Status   Specimen Description   Final    TRACHEAL ASPIRATE Performed at  [facility] [address] [place], [place] [address]    Special Requests   Final    NONE Performed at [facility], [place] [address], [place], [place] [address]    Gram Stain   Final    ABUNDANT WBC PRESENT, PREDOMINANTLY PMN ABUNDANT GRAM POSITIVE RODS Performed at [facility], [place] [address], [place], [place] [address]    Culture FEW STENOTROPHOMONAS MALTOPHILIA  Final   Report Status 04/04/2020 FINAL  Final   Organism ID, Bacteria STENOTROPHOMONAS MALTOPHILIA  Final      Susceptibility   Stenotrophomonas maltophilia - MIC*    LEVOFLOXACIN 2 SENSITIVE Sensitive     TRIMETH/SULFA 80 RESISTANT Resistant     * FEW STENOTROPHOMONAS MALTOPHILIA         Radiology Studies: DG Swallowing Func-Speech Pathology  Result Date: 04/08/2020 Objective Swallowing Evaluation: Type of Study: MBS-Modified Barium Swallow Study  Patient Details Name: Steve [name] MRN: [id number] Date of Birth: 1955-10-24 Today's Date: 04/08/2020 Time: SLP Start Time (ACUTE ONLY): 0831 -SLP Stop Time (ACUTE ONLY): 0849 SLP Time Calculation (min) (ACUTE ONLY): 18 min Past Medical History: Past Medical History: Diagnosis Date . Abscess of muscle 08/10/2011  staph infection of right hip  . Acute on chronic respiratory failure with hypoxia ([place])  . Acute respiratory distress syndrome (ARDS) due to COVID-19 virus ([place])  . COVID-19 virus infection  . Diabetes mellitus without complication ([place])  . GERD (gastroesophageal reflux disease)   rare reflux - no meds for reflux - NO PROBLEM IN PAST SEVERAL YRS . HCAP (healthcare-associated pneumonia)  . Healthcare-associated pneumonia  . Hip dysplasia, congenital   no surgery as a child for hip dysplasia - has had bilateral hip replacements as an adult . Hodgkin lymphoma ([place])  . Hodgkin's lymphoma ([place])  . Hypertension  . Pancreatitis  . Pneumothorax, acute  . Postoperative anemia due to acute blood loss 09/07/2012 . Septic arthritis of hip  ([place]) 09/05/2012  PT'S TOTAL HIP JOINT REMOVED - ANTIBIOTIC SPACE PLACED AND PT HAS FINISHED IV ANTIBIOTICS ( PICC LINE REMOVED) . Sleep apnea   USES CPAP Past Surgical History: Past Surgical History: Procedure Laterality Date . COLONOSCOPY  04/26/2007 . HERNIA REPAIR    inguinal hernia x3 . IR GASTROSTOMY TUBE MOD SED  08/22/2019 . IR IMAGING GUIDED PORT INSERTION  03/29/2019 . JOINT REPLACEMENT  2002 & 2007  bilateral hip replacement . LYMPH NODE BIOPSY Right 03/20/2019  Procedure: DEEP RIGHT SUPRACLAVICULAR LYMPH NODE EXCISION;  Surgeon: Fanny Skates, MD;  Location: [facility];  Service: General;  Laterality: Right; . MULTIPLE EXTRACTIONS WITH ALVEOLOPLASTY  07/28/2011  Procedure: MULTIPLE EXTRACION WITH ALVEOLOPLASTY;  Surgeon: Lenn Cal, DDS;  Location: [facility];  Service: Oral Surgery;  Laterality: N/A;  Extraction of tooth #'s 2,3,4,5,6,11,12,13,15,19,22 with alveoloplasty. . shoulder repair - right for separation of shoulder   . TEE WITHOUT CARDIOVERSION  07/29/2011  Procedure: TRANSESOPHAGEAL ECHOCARDIOGRAM (TEE);  Surgeon: Josue Hector, MD;  Location: [facility];  Service: Cardiovascular;  Laterality: N/A; . TOTAL HIP REVISION Right 09/05/2012  Procedure: RIGHT HIP RESECTION ARTHROPLASTY WITH ANTIBIOTIC SPACERS;  Surgeon: [name], MD;  Location: [facility];  Service: Orthopedics;  Laterality: Right; . 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 S/p chest tube and Tracheostomy Exchange 12/13.  PEG.  No record of prior swallow or  PMV evaluations. Pt originally had trach placed in April 2021 after admission for severe acute respiratory failure with hypoxemia in the setting of receiving chemotherapy for Hodgkin's lymphoma.  Pt now on trach collar - 10 L 40% and uses home vent at night.  CXR Progressive pulmonary hypoinflation.     Stable pulmonary infiltrates related to underlying interstitial lung  disease.     Stable right paratracheal soft tissue thickening related to  underlying known mediastinal adenopathy.[name] changed yesterday to Shiley #6 XL.  MBS indicated to assess for po readiness.  Pt with h/o dysphagia and had Gtube placed 09/09/2019  Subjective: alert, desires ativan but approved to wait until after mbs, wanted suctioned- advised rn and RT Assessment / Plan / Recommendation CHL IP CLINICAL IMPRESSIONS 04/08/2020 Clinical Impression Patient presents with mild oropharyngeal dysphagia c/b weakness resulting in decreased laryngeal closure and epiglottic deflection with resultant pharyngeal retention that mixed with secretions. No penetration or aspiration of all po observed - however as pt's RR incr and O2 sats decreased, he will be at increased risk of more than a few bites/sips.  Pt was not tested with PMSV as within a few minutes of placement, pt desated and reported difficulty breathing.  Upon removal of valve, did not note breath stacking/CO2 retention. Re: pharyngeal residuals Pt did not sense residuals in pharynx but was able to swallow to help decrease.  Poor mastication abilities as pt did not have dentures in place - and with his WOB and deconditioning, doubtful he will tolerate solids.  Due to laryngeal elevation deficits - pt must be as upright for po intake.  No coughing or throat clearing noted during MBS, however following -  pt with coughing after he was moved into bed.  Of note, pt's desaturated during MBS to lowest 82% and RR increased to 30's.  Test was expedited rapidly due to desaturations.  Recommend intake  from floor stock provided by RN only after suctioning completed and use tube feeding for nutrition. SLP will follow up for dysphagia management/strengthening of laryngeal closure/epiglottic deflection to decrease pharyngeal retention and maximize airway protection. SLP Visit Diagnosis Dysphagia, pharyngeal phase (R13.13) Attention and concentration deficit following -- Frontal lobe and executive function deficit following -- Impact on safety and function --   No flowsheet data found.  Prognosis 04/08/2020 Prognosis for Safe Diet Advancement Fair Barriers to Reach Goals Severity of deficits Barriers/Prognosis Comment -- CHL IP DIET RECOMMENDATION 04/08/2020 SLP Diet Recommendations Dysphagia 1 (Puree) solids;Thin liquid Liquid Administration via Cup;Straw Medication Administration Via alternative means Compensations Multiple dry swallows after each bite/sip;Other (Comment) Postural Changes Remain semi-upright after after feeds/meals (Comment);Seated upright at 90 degrees   CHL IP OTHER RECOMMENDATIONS 04/08/2020 Recommended Consults -- Oral Care Recommendations Oral care QID Other Recommendations --   CHL IP FOLLOW UP RECOMMENDATIONS 04/08/2020 Follow up Recommendations Inpatient Rehab   CHL IP FREQUENCY AND DURATION 04/08/2020 Speech Therapy Frequency (ACUTE ONLY) min 2x/week Treatment Duration 2 weeks      CHL IP ORAL PHASE 04/08/2020 Oral Phase Impaired Oral - Pudding Teaspoon -- Oral - Pudding Cup -- Oral - Honey Teaspoon -- Oral - Honey Cup -- Oral - Nectar Teaspoon WFL;Piecemeal swallowing Oral - Nectar Cup -- Oral - Nectar Straw -- Oral - Thin Teaspoon WFL;Piecemeal swallowing Oral - Thin Cup [facility];Piecemeal swallowing Oral - Thin Straw WFL;Piecemeal swallowing Oral - Puree Premature spillage;Piecemeal swallowing Oral - Mech Soft Impaired mastication;Weak lingual manipulation;Piecemeal swallowing Oral - Regular -- Oral - Multi-Consistency -- Oral - Pill -- Oral Phase - Comment --  CHL IP PHARYNGEAL PHASE  04/08/2020 Pharyngeal Phase Impaired Pharyngeal- Pudding Teaspoon -- Pharyngeal -- Pharyngeal- Pudding Cup -- Pharyngeal -- Pharyngeal- Honey Teaspoon -- Pharyngeal -- Pharyngeal- Honey Cup -- Pharyngeal -- Pharyngeal- Nectar Teaspoon Pharyngeal residue - valleculae;Pharyngeal residue - pyriform;Reduced laryngeal elevation Pharyngeal Material does not enter airway Pharyngeal- Nectar Cup -- Pharyngeal -- Pharyngeal- Nectar Straw Pharyngeal residue - valleculae;Pharyngeal residue - pyriform;Reduced laryngeal elevation Pharyngeal -- Pharyngeal- Thin Teaspoon Reduced laryngeal elevation;Pharyngeal residue - valleculae;Pharyngeal residue - pyriform Pharyngeal Material does not enter airway Pharyngeal- Thin Cup Pharyngeal residue - valleculae;Pharyngeal residue - pyriform;Reduced laryngeal elevation Pharyngeal Material does not enter airway Pharyngeal- Thin Straw Pharyngeal residue - valleculae;Pharyngeal residue - pyriform;Reduced laryngeal elevation Pharyngeal Material does not enter airway Pharyngeal- Puree Reduced laryngeal elevation;Pharyngeal residue - valleculae Pharyngeal Material does not enter airway Pharyngeal- Mechanical Soft Reduced tongue base retraction;Pharyngeal residue - valleculae Pharyngeal Material does not enter airway Pharyngeal- Regular -- Pharyngeal -- Pharyngeal- Multi-consistency -- Pharyngeal -- Pharyngeal- Pill -- Pharyngeal -- Pharyngeal Comment --  CHL IP CERVICAL ESOPHAGEAL PHASE 04/08/2020 Cervical Esophageal Phase WFL Pudding Teaspoon -- Pudding Cup -- Honey Teaspoon -- Honey Cup -- Nectar Teaspoon -- Nectar Cup -- Nectar Straw -- Thin Teaspoon -- Thin Cup -- Thin Straw -- Puree -- Mechanical Soft -- Regular -- Multi-consistency -- Pill -- Cervical Esophageal Comment -- Kathleen Lime, MS [facility] SLP Acute Rehab Services Office [phone] Pager [phone] [name] 04/08/2020, 9:34 AM                   Scheduled Meds: . chlorhexidine  15 mL Mouth Rinse BID  . Chlorhexidine  Gluconate Cloth  6 each  Topical Daily  . enoxaparin (LOVENOX) injection  40 mg Subcutaneous Q24H  . feeding supplement (PROSource TF)  45 mL Per Tube TID  . free water  100 mL Per Tube Q4H  . insulin aspart  0-15 Units Subcutaneous TID WC  . insulin aspart  0-5 Units Subcutaneous QHS  . mouth rinse  15 mL Mouth Rinse q12n4p  . pantoprazole sodium  40 mg Per Tube Daily  . sertraline  50 mg Per Tube Daily  . sodium chloride flush  10-40 mL Intracatheter Q12H   Continuous Infusions: . feeding supplement (OSMOLITE 1.2 CAL) 65 mL/hr at 04/09/20 0000     LOS: 17 days    Time spent: 40 minutes    Irine Seal, MD [facility]   To contact the attending provider between 7A-7P or the covering provider during after hours 7P-7A, please log into the web site [email or web address] and access using universal [place] password for that web site. If you do not have the password, please call the hospital operator.  04/09/2020, 9:53 AM

## 2020-04-10 DIAGNOSIS — J939 Pneumothorax, unspecified: Secondary | ICD-10-CM | POA: Diagnosis not present

## 2020-04-10 DIAGNOSIS — J9621 Acute and chronic respiratory failure with hypoxia: Secondary | ICD-10-CM | POA: Diagnosis not present

## 2020-04-10 DIAGNOSIS — R5381 Other malaise: Secondary | ICD-10-CM | POA: Diagnosis not present

## 2020-04-10 DIAGNOSIS — F419 Anxiety disorder, unspecified: Secondary | ICD-10-CM | POA: Diagnosis not present

## 2020-04-10 DIAGNOSIS — Z9911 Dependence on respirator [ventilator] status: Secondary | ICD-10-CM | POA: Diagnosis not present

## 2020-04-10 LAB — GLUCOSE, CAPILLARY
Glucose-Capillary: 100 mg/dL — ABNORMAL HIGH (ref 70–99)
Glucose-Capillary: 123 mg/dL — ABNORMAL HIGH (ref 70–99)
Glucose-Capillary: 110 mg/dL — ABNORMAL HIGH (ref 70–99)
Glucose-Capillary: 132 mg/dL — ABNORMAL HIGH (ref 70–99)

## 2020-04-10 LAB — HEMOGLOBIN AND HEMATOCRIT, BLOOD
HCT: 30.8 % — ABNORMAL LOW (ref 39.0–52.0)
Hemoglobin: 9.7 g/dL — ABNORMAL LOW (ref 13.0–17.0)

## 2020-04-10 LAB — BASIC METABOLIC PANEL
Anion gap: 8 (ref 5–15)
BUN: 32 mg/dL — ABNORMAL HIGH (ref 8–23)
CO2: 34 mmol/L — ABNORMAL HIGH (ref 22–32)
Calcium: 9 mg/dL (ref 8.9–10.3)
Chloride: 95 mmol/L — ABNORMAL LOW (ref 98–111)
Creatinine, Ser: 0.61 mg/dL (ref 0.61–1.24)
GFR, Estimated: >60 mL/min (ref >60)
Glucose, Bld: 149 mg/dL — ABNORMAL HIGH (ref 70–99)
Potassium: 4 mmol/L (ref 3.5–5.1)
Sodium: 137 mmol/L (ref 135–145)

## 2020-04-10 LAB — MAGNESIUM: Magnesium: 2 mg/dL (ref 1.7–2.4)

## 2020-04-10 MED ORDER — KETOROLAC TROMETHAMINE 30 MG/ML IJ SOLN
30.0000 mg | Freq: Four times a day (QID) | INTRAMUSCULAR | Status: AC | PRN
  Administered 2020-04-14 – 2020-04-15 (×2): 30 mg via INTRAVENOUS
  Filled 2020-04-10 (×2): qty 1

## 2020-04-10 NOTE — Progress Notes (Signed)
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 S/p chest tube and Tracheostomy Exchange 12/13. Patient on trach collar/vent PRN.    PT Comments    Pt continues very motivated and progressing slowly but steadily with mobility.  Pt requiring frequent extended rest breaks to recover from SOB/desat but up to ambulate short distance twice and with decreasing level of assist required for most tasks.  Follow Up Recommendations  CIR     Equipment Recommendations  Wheelchair (measurements PT);Wheelchair cushion (measurements PT)    Recommendations for Other Services       Precautions / Restrictions Precautions Precautions: Fall Precaution Comments: trach, PEG tube, has a PMV( do not have it in place when mobilizing), needs his sneakers brought in.Per [name], NP, ok to increase to 50% TC Required Braces or Orthoses: Other Brace Splint/Cast: prevalon boots Restrictions Weight Bearing Restrictions: No    Mobility  Bed Mobility Overal bed mobility: Needs Assistance Bed Mobility: Supine to Sit     Supine to sit: Min assist     General bed mobility comments: increased time; physical assist to bring trunk up from sidely to sit  Transfers Overall transfer level: Needs assistance Equipment used: Bilateral platform walker Transfers: Sit to/from Stand Sit to Stand: +2 safety/equipment;+2 physical assistance;Min assist         General transfer comment: cues for use of UEs to self assist; physical assist to bring wt up and fwd and to balance in initial  standing  Ambulation/Gait Ambulation/Gait assistance: Min assist;+2 physical assistance;+2 safety/equipment Gait Distance (Feet): 5 Feet (5' twice with extended seated rest break between attempts) Assistive device: Bilateral platform walker Gait Pattern/deviations: Step-through pattern;Decreased step length - right;Decreased step length - left;Shuffle;Trunk flexed     General Gait Details: cues for pace, posture and position from EVA walker.  Distance ltd by [employer]             Wheelchair Mobility    Modified Rankin (Stroke Patients Only)       Balance Overall balance assessment: Needs assistance Sitting-balance support: No upper extremity supported;Feet supported Sitting balance-Leahy Scale: Fair     Standing balance support: Bilateral upper extremity supported Standing balance-Leahy Scale: Poor Standing balance comment: reliant on support                            Cognition Arousal/Alertness: Awake/alert Behavior During Therapy: Anxious Overall Cognitive Status: Within Functional Limits for tasks assessed                                 General Comments: anxious when SOB      Exercises      General Comments        Pertinent Vitals/Pain Pain Assessment: No/denies pain    Home Living                      Prior Function            PT Goals (current goals can now  be found in the care plan section) Acute Rehab PT Goals Patient Stated Goal: Regain as much IND as possible PT Goal Formulation: With patient Time For Goal Achievement: 04/21/20 Potential to Achieve Goals: Fair Progress towards PT goals: Progressing toward goals    Frequency    Min 3X/week      PT Plan Current plan remains appropriate    Co-evaluation              AM-PAC PT "6 Clicks" Mobility   Outcome Measure  Help needed turning from your back to your side while in a flat bed without using bedrails?: A Little Help  needed moving from lying on your back to sitting on the side of a flat bed without using bedrails?: A Little Help needed moving to and from a bed to a chair (including a wheelchair)?: A Lot Help needed standing up from a chair using your arms (e.g., wheelchair or bedside chair)?: A Lot Help needed to walk in hospital room?: A Lot Help needed climbing 3-5 steps with a railing? : Total 6 Click Score: 13    End of Session Equipment Utilized During Treatment: Gait belt;Oxygen Activity Tolerance: Patient limited by fatigue Patient left: in chair;with call bell/phone within reach;with chair alarm set Nurse Communication: Mobility status PT Visit Diagnosis: Muscle weakness (generalized) (M62.81)     Time: 4982-6415 PT Time Calculation (min) (ACUTE ONLY): 28 min  Charges:  $Gait Training: 8-22 mins $Therapeutic Activity: 8-22 mins                     [place] Pager [phone] Office [phone]    Omari Mcmanaway 04/10/2020, 5:42 PM

## 2020-04-10 NOTE — Progress Notes (Signed)
   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  Baseline = bed bound, able to sit on side of bed only/ wife main caretaker  Past Medical History  Sleep apnea, on CPAP at home Post Covid pneumonia pulmonary fibrosis, on mechanical ventilation Chronic respiratory failure with hypoxemia on mechanical ventilation at home Hodgkin's lymphoma Hypertension History of pancreatitis Gastroesophageal reflux disease Diabetes mellitus type 2  Significant Hospital Events   December 13 admission, chest tube placed, trache exchanged 12/15 trach changed to #8 shiley, worsening cuff leak, #6 XLT distal put back in  12/16 trach changed to #8 distal XLT, no cuff leak, removed chest tube 12/20 placed on home vent 12/22 started ATC trials. Did send sputum as had some blood tinge. Having trouble getting home health assistance. 12/23 working on [employer]. Working on [employer]. Plan to change vent to PRN.  12/24 tolerated off vent 12/28 changed to cuffless trach, remains off ventilator 12/29 swallow evaluation completed. No witnessed aspiration but did desaturate due to endurance and deconditioning Change to size 6 regular cuffless 12/31 no overnight events Consults:    Procedures:  Tracheostomy present prior to admission 9/13 right chest tube > 9/16  Significant Diagnostic Tests:  September 13 chest x-ray showed right-sided pneumothorax, chronic fibrotic changes, tracheostomy in place Bronchoscopy 12/13> no clear tracheal injury  Micro Data:  December 13 SARS-CoV-2/influenza  negative December 13  MRSA PCR  Neg  December 13 respiratory culture > few gram negative rods>> diptheroids   Antimicrobials:  Ceftriaxone   12/13 > 12/17 Levaquin 12/26 > 12/27  Interim history/subjective:  No overnight events Continues to stabilize  Objective   Blood pressure 96/67, pulse 85, temperature 98.6 F (37 C), temperature source Oral, resp. rate (!) 9, height 5\' 5"  (1.651 m), weight 61 kg, SpO2 95 %.    FiO2 (%):  [40 %] 40 %   Intake/Output Summary (Last 24 hours) at 04/10/2020 1608 Last data filed at 04/10/2020 0500 Gross per 24 hour  Intake -  Output 600 ml  Net -600 ml   Filed Weights   04/08/20 0500 04/09/20 0322 04/10/20 0411  Weight: 60.3 kg 60.7 kg 61 kg    Examination:   Does not appear to be in acute distress HEENT: Normocephalic atraumatic, size 6 trach tube in place Respiratory: Clear breath sounds Cardiac: S1-S2 appreciated Abdomen: Bowel sounds appreciated Extremities: Warm dry brisk cap refill Neuro: Awake oriented no focal deficits.  Resolved Hospital Problem list   Tachycardia: resolved Right pneumothorax > resolved CAP stopped 12/17 Acute on chronic hypoxic respiratory failure Resolved ventilator dependence  Assessment & Plan:  Chronic respiratory failure Post Covid fibrosis Severe deconditioning- Anxiety Diabetes History of lymphoma Severe deconditioning Dysphagia   Continue supportive measures Will require extensive rehabilitation  Call patient to be able to decannulate down the line as to get stronger  Likely will require oxygen long-term Possibly-May be decannulated  Plan Continue oxygen supplementation Trach wean as tolerated in the outpatient Disposition-being worked on   Sherrilyn Rist, MD [employer] [facility] Pager: [phone]

## 2020-04-10 NOTE — Progress Notes (Signed)
Steve [name]  VFI:[id number] DOB: [birth year] DOA: 03/23/2020 PCP: [name], PA-C   Chief Complaint  Patient presents with  . Cough  . 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   Assessment & Plan:   Active Problems:   Acute on chronic respiratory failure ([name])   Status post tracheostomy ([place])   On mechanically assisted ventilation ([name])   Pneumothorax on right   Tracheostomy care ([place])   Debility   Anxiety  1 acute on chronic hypoxic respiratory failure in the setting of Covid related pulmonary fibrosis and severe deconditioning/tracheostomy dependence Patient was admitted to the critical care team and transferred to the hospitalist team today 04/08/2020.  Patient currently on ventilator.  Patient has been changed to a cuffless size 6 and per PCCM does not need XLT sizing. Per PCCM greatest challenge at this time is profound deconditioning superimposed on chronic underlying fibrotic lung changes.  Per PCCM patient likely will always be oxygen dependent however does not feel patient will require trach indefinitely and currently needing aggressive rehab and at this point oxygen requirements noted to be a barrier.  Per PCCM okay to titrate supplemental oxygen as high as 50% with activity and noted patient has to be less than 35% to be a CIR candidate.  PCCM feels may need to consider LTAC again.  Continue current bronchial hygiene.  Received first Covid vaccine and due for second Covid vaccine after 04/23/2019.  We will give a dose of Lasix 40 mg IV x1 today.  Per PCCM.  2.  Pneumothorax Resolved.  Per PCCM.  3. 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  Antibiotics have been discontinued.  Follow.  4.  Well-controlled diabetes mellitus type 2 Hemoglobin A1c 5.7 (03/23/2020).  CBG 100 this morning.  Continue tube feeds.  Sliding scale insulin.   5.  Anxiety Ativan as needed.  6.  History of lymphoma Treated in the spring 2021 prior to Covid.  Outpatient follow-up with oncology.    7.  Severe deconditioning/debility Patient noted not to have received physical therapy in several months but feel he can rebound.  PT OT has assessed the patient and recommending CIR and consult placed.  Due to increasing O2 requirements may be a limiting factor.  If CIR not an option per PCCM LTAC may be appropriate.  Continue PT/ OT.  TOC consulted for possible LTAC in case patient gets denied for CIR due to O2 requirements.   8.  Dysphagia Patient noted to desat with modified barium swallow however felt secondary to deconditioning and not related to aspiration.  Continue tube feeds per PEG tube.  9.  Gastro esophageal reflux disease PPI.             DVT prophylaxis: Lovenox Code Status: Full Family Communication: Updated patient.  No family at bedside. Disposition:   Status is: Inpatient    Dispo: The patient is from: Home              Anticipated d/c is to: CIR hopefully versus LTAC              Anticipated d/c date is: 2 to 3 days  Patient currently not medically stable for discharge.  Still with some desats with increasing O2 requirements       Consultants:   PCCM admission  Wound care   [place] Hospital Events   December 13 admission, chest tube placed, trache exchanged 12/15 trach changed to #8 shiley, worsening cuff leak, #6 XLT distal put back in  12/16 trach changed to #8 distal XLT, no cuff leak, removed chest tube 12/20 placed on home vent 12/22 started ATC trials. Did send sputum as had some blood tinge. Having trouble getting home health assistance. 12/23 working on [employer]. Working on  [employer]. Plan to change vent to PRN.  12/24 tolerated off vent 12/28 changed to cuffless trach, remains off ventilator 12/29 swallow evaluation completed. No witnessed aspiration but did desaturate due to endurance and deconditioning  Micro Data:  December 13 SARS-CoV-2/influenza negative December 13  MRSA PCR  Neg  December 13 respiratory culture > few gram negative rods>> diptheroids    Procedures:  Tracheostomy present prior to admission 9/13 right chest tube > 9/16  Significant diagnostic tests September 13 chest x-ray showed right-sided pneumothorax, chronic fibrotic changes, tracheostomy in place Bronchoscopy 12/13> no clear tracheal injury  Antimicrobials:  Ceftriaxone   12/13 > 12/17 Levaquin 12/26 > 12/27   Subjective: Patient sitting up in bed.  Patient with complaints of headache.  Patient also with complaints of congestion and per RN increasing secretions.  Patient denies any chest pain.  No significant change with shortness of breath.  No abdominal pain.   Objective: Vitals:   04/10/20 0411 04/10/20 0500 04/10/20 0600 04/10/20 0800  BP:  108/65 96/67   Pulse:  86 85   Resp:  (!) 24 (!) 9   Temp:    97.9 F (36.6 C)  TempSrc:    Oral  SpO2:  95% 95%   Weight: 61 kg     Height:        Intake/Output Summary (Last 24 hours) at 04/10/2020 1012 Last data filed at 04/10/2020 0500 Gross per 24 hour  Intake 10 ml  Output 1300 ml  Net -1290 ml   Filed Weights   04/08/20 0500 04/09/20 0322 04/10/20 0411  Weight: 60.3 kg 60.7 kg 61 kg    Examination:  General exam: NAD  Respiratory system: Some decreased breath sounds in the bases.  Fine bibasilar crackles.  Tracheostomy intact.  Cardiovascular system: Regular rate rhythm no murmurs rubs or gallops.  No JVD.  No lower extremity edema.  Gastrointestinal system: Abdomen is soft, nontender, nondistended, positive bowel sounds.  PEG tube in place.   Central nervous system: Alert and oriented. No focal neurological  deficits. Extremities: Symmetric 5 x 5 power. Skin: No rashes, lesions or ulcers Psychiatry: Judgement and insight appear normal. Mood & affect appropriate.     Data Reviewed: I have personally reviewed following labs and imaging studies  CBC: Recent Labs  Lab 04/05/20 0502 04/07/20 0505 04/08/20 1042 04/09/20 0326 04/10/20 0408  WBC 9.8 9.7 9.0 7.4  --   NEUTROABS  --   --  5.1 3.8  --   HGB 9.8* 10.1* 10.3* 9.9* 9.7*  HCT 31.4* 32.4* 32.8* 31.9* 30.8*  MCV 91.5 92.3 92.1 93.8  --   PLT 267 252 242 256  --     Basic Metabolic Panel: Recent Labs  Lab 04/05/20 0502 04/06/20 0439 04/07/20 0505 04/08/20 1042 04/09/20 0326 04/10/20 0408  NA 138  --  137 136 138 137  K 4.3  --  4.3 4.1 4.0 4.0  CL 96*  --  97* 96* 96* 95*  CO2 33*  --  30 31 33* 34*  GLUCOSE 125*  --  154* 125* 105* 149*  BUN 25*  --  24* 26* 26* 32*  CREATININE 0.56* 0.67 0.57* 0.67 0.58* 0.61  CALCIUM 9.3  --  9.1 9.2 9.2 9.0  MG  --   --  1.8 1.9  --  2.0  PHOS  --   --  3.6 3.8  --   --     GFR: Estimated Creatinine Clearance: 80.5 mL/min (by C-G formula based on SCr of 0.61 mg/dL).  Liver Function Tests: Recent Labs  Lab 04/08/20 1042  ALBUMIN 3.0*    CBG: Recent Labs  Lab 04/09/20 0747 04/09/20 1230 04/09/20 1617 04/09/20 2125 04/10/20 0753  GLUCAP 153* 154* 110* 114* 100*     Recent Results (from the past 240 hour(s))  Culture, respiratory (non-expectorated)     Status: None   Collection Time: 04/01/20  3:40 PM   Specimen: Tracheal Aspirate; Respiratory  Result Value Ref Range Status   Specimen Description   Final    TRACHEAL ASPIRATE Performed at [facility] [address] [place], [place] [address]    Special Requests   Final    NONE Performed at [facility], [place] [address], [place], [place] [address]    Gram Stain   Final    ABUNDANT WBC PRESENT, PREDOMINANTLY PMN ABUNDANT GRAM POSITIVE RODS Performed at [facility], [place] [address]  [place], [place] [address]    Culture FEW STENOTROPHOMONAS MALTOPHILIA  Final   Report Status 04/04/2020 FINAL  Final   Organism ID, Bacteria STENOTROPHOMONAS MALTOPHILIA  Final      Susceptibility   Stenotrophomonas maltophilia - MIC*    LEVOFLOXACIN 2 SENSITIVE Sensitive     TRIMETH/SULFA 80 RESISTANT Resistant     * FEW STENOTROPHOMONAS MALTOPHILIA         Radiology Studies: No results found.      Scheduled Meds: . chlorhexidine  15 mL Mouth Rinse BID  . Chlorhexidine Gluconate Cloth  6 each Topical Daily  . enoxaparin (LOVENOX) injection  40 mg Subcutaneous Q24H  . feeding supplement (PROSource TF)  45 mL Per Tube TID  . free water  100 mL Per Tube Q4H  . insulin aspart  0-15 Units Subcutaneous TID WC  . insulin aspart  0-5 Units Subcutaneous QHS  . mouth rinse  15 mL Mouth Rinse q12n4p  . pantoprazole sodium  40 mg Per Tube Daily  . sertraline  50 mg Per Tube Daily  . sodium chloride flush  10-40 mL Intracatheter Q12H   Continuous Infusions: . feeding supplement (OSMOLITE 1.2 CAL) 65 mL/hr at 04/09/20 0000     LOS: 18 days    Time spent: 35 minutes    Irine Seal, MD [facility]   To contact the attending provider between 7A-7P or the covering provider during after hours 7P-7A, please log into the web site [email or web address] and access using universal [place] password for that web site. If you do not have the password, please call the hospital operator.  04/10/2020, 10:12 AM

## 2020-04-10 NOTE — Progress Notes (Signed)
Inpatient Rehab Admissions Coordinator:   Following for my colleague, Danne Baxter.  Note O2 requirements still outside of what CIR can manage.  She will f/u next week.   Shann Medal, PT, DPT Admissions Coordinator [phone] 04/10/20  10:25 AM

## 2020-04-11 DIAGNOSIS — J9621 Acute and chronic respiratory failure with hypoxia: Secondary | ICD-10-CM | POA: Diagnosis not present

## 2020-04-11 DIAGNOSIS — F419 Anxiety disorder, unspecified: Secondary | ICD-10-CM | POA: Diagnosis not present

## 2020-04-11 DIAGNOSIS — R5381 Other malaise: Secondary | ICD-10-CM | POA: Diagnosis not present

## 2020-04-11 DIAGNOSIS — J939 Pneumothorax, unspecified: Secondary | ICD-10-CM | POA: Diagnosis not present

## 2020-04-11 LAB — GLUCOSE, CAPILLARY
Glucose-Capillary: 155 mg/dL — ABNORMAL HIGH (ref 70–99)
Glucose-Capillary: 126 mg/dL — ABNORMAL HIGH (ref 70–99)
Glucose-Capillary: 108 mg/dL — ABNORMAL HIGH (ref 70–99)
Glucose-Capillary: 117 mg/dL — ABNORMAL HIGH (ref 70–99)

## 2020-04-11 LAB — BASIC METABOLIC PANEL
Anion gap: 8 (ref 5–15)
BUN: 28 mg/dL — ABNORMAL HIGH (ref 8–23)
CO2: 34 mmol/L — ABNORMAL HIGH (ref 22–32)
Calcium: 9.2 mg/dL (ref 8.9–10.3)
Chloride: 96 mmol/L — ABNORMAL LOW (ref 98–111)
Creatinine, Ser: 0.55 mg/dL — ABNORMAL LOW (ref 0.61–1.24)
GFR, Estimated: >60 mL/min (ref >60)
Glucose, Bld: 108 mg/dL — ABNORMAL HIGH (ref 70–99)
Potassium: 4 mmol/L (ref 3.5–5.1)
Sodium: 138 mmol/L (ref 135–145)

## 2020-04-11 NOTE — Progress Notes (Signed)
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 S/p chest tube and Tracheostomy Exchange 12/13. Patient on trach collar/vent PRN.   OT comments  Pt presents supine in bed pleasant and willing to participate in therapy session. Pt progressing with mobility/OOB but continues to require increased time for seated rest breaks given fatigue and overall O2 status. Pt tolerating short bout of mobility in room using Eva walker with +2 assist. Pt demonstrates good carryover of deep breathing techniques given min cues. Lowest SpO2 noted with activity today was 82% on 15L O2 (via trach collar), rebounding to >90% within approx 1-2 min. He continues to require up to [place] for LB ADL. Feel pt remains a great candidate for CIR level therapies. Will continue to follow acutely.   Follow Up Recommendations  CIR    Equipment Recommendations  3 in 1 bedside commode          Precautions / Restrictions Precautions Precautions: Fall Precaution Comments: trach, PEG tube, has a PMV( do not have it in place when mobilizing), needs his sneakers brought in.Per [name], NP, ok to increase to 50% TC Required Braces or Orthoses: Other Brace Splint/Cast: prevalon boots Restrictions Weight Bearing Restrictions: No       Mobility Bed Mobility Overal bed mobility: Needs Assistance Bed Mobility: Supine to Sit     Supine to sit: HOB elevated;Min guard     General bed mobility comments: increased time but no physical assist  Transfers Overall transfer level: Needs assistance Equipment used: Bilateral  platform walker Transfers: Sit to/from Stand Sit to Stand: +2 safety/equipment;+2 physical assistance;Min assist;Mod assist         General transfer comment: cues for use of UEs to self assist; physical assist to bring wt up and fwd and to balance in initial standing    Balance Overall balance assessment: Needs assistance Sitting-balance support: No upper extremity supported;Feet supported Sitting balance-Leahy Scale: Fair     Standing balance support: Bilateral upper extremity supported Standing balance-Leahy Scale: Poor Standing balance comment: reliant on support                           ADL either performed or assessed with clinical judgement   ADL Overall ADL's : Needs assistance/impaired       Grooming Details (indicate cue type and reason): pt declined engaging in grooming ADL while seated EOB, suspect partly due to fatigue             Lower Body Dressing: Total assistance;Bed level Lower Body Dressing Details (indicate cue type and reason): to don socks/shoes             Functional mobility during ADLs: Moderate assistance;+2 for physical assistance;+2 for safety/equipment (eva walker)                         Cognition Arousal/Alertness: Awake/alert Behavior During Therapy: Anxious Overall Cognitive Status: Within Functional Limits for tasks assessed  General Comments: anxious when SOB and discouraged that he is not progressing faster        Exercises     Shoulder Instructions       General Comments      Pertinent Vitals/ Pain       Pain Assessment: No/denies pain  Home Living                                          Prior Functioning/Environment              Frequency  Min 2X/week        Progress Toward Goals  OT Goals(current goals can now be found in the care plan section)  Progress towards OT goals: Progressing toward goals  Acute Rehab  OT Goals Patient Stated Goal: Regain as much IND as possible OT Goal Formulation: With patient Time For Goal Achievement: 04/22/20 Potential to Achieve Goals: Good ADL Goals Additional ADL Goal #1: Patient will require min A x1 for functional transfers Additional ADL Goal #2: Patient will sit up EOB at supervision level for 15 minutes in order to participate in self care tasks. Additional ADL Goal #3: Patient will wash UB seated EOB at supervision level in order to reduce caregiver burden.  Plan Discharge plan remains appropriate    Co-evaluation    PT/OT/SLP Co-Evaluation/Treatment: Yes Reason for Co-Treatment: Complexity of the patient's impairments (multi-system involvement);For patient/therapist safety;To address functional/ADL transfers PT goals addressed during session: Mobility/safety with mobility OT goals addressed during session: ADL's and self-care      AM-PAC OT "6 Clicks" Daily Activity     Outcome Measure   Help from another person eating meals?: Total (NPO) Help from another person taking care of personal grooming?: A Little Help from another person toileting, which includes using toliet, bedpan, or urinal?: A Lot Help from another person bathing (including washing, rinsing, drying)?: A Lot Help from another person to put on and taking off regular upper body clothing?: A Little Help from another person to put on and taking off regular lower body clothing?: Total 6 Click Score: 12    End of Session Equipment Utilized During Treatment: Gait belt;Rolling walker;Oxygen  OT Visit Diagnosis: Other abnormalities of gait and mobility (R26.89);Muscle weakness (generalized) (M62.81)   Activity Tolerance Patient tolerated treatment well   Patient Left in chair;with call bell/phone within reach;with chair alarm set   Nurse Communication Mobility status        Time: 1540-0867 OT Time Calculation (min): 33 min  Charges: OT General Charges $OT Visit: 1 Visit OT  Treatments $Self Care/Home Management : 8-22 mins  Lou Cal, OT Acute Rehabilitation Services Pager [phone] Office [phone]    Raymondo Band 04/11/2020, 1:05 PM

## 2020-04-11 NOTE — Progress Notes (Signed)
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 S/p chest tube and Tracheostomy Exchange 12/13. Patient on trach collar/vent PRN.    PT Comments    Pt slightly discouraged this am but agreeable to attempt mobility.  Improvement in bed mobility noted; Pt continues to require rest breaks between tasks 2* SOB/desat but with decreased time needed this am.  Pt on 8L venimask in bed but O2 boosted to 10L with activity and then to 15L.  Pt up in chair and positioned for comfort at session end.   Follow Up Recommendations  CIR     Equipment Recommendations  Wheelchair (measurements PT);Wheelchair cushion (measurements PT)    Recommendations for Other Services       Precautions / Restrictions Precautions Precautions: Fall Precaution Comments: trach, PEG tube, has a PMV( do not have it in place when mobilizing), needs his sneakers brought in.Per [name], NP, ok to increase to 50% TC Required Braces or Orthoses: Other Brace Splint/Cast: prevalon boots Restrictions Weight Bearing Restrictions: No    Mobility  Bed Mobility Overal bed mobility: Needs Assistance Bed Mobility: Supine to Sit     Supine to sit: HOB elevated;Min guard     General bed mobility comments: increased time but no physical assist  Transfers Overall transfer level: Needs assistance Equipment used: Bilateral platform walker Transfers: Sit to/from Stand Sit to Stand: +2 safety/equipment;+2 physical assistance;Min assist;Mod assist         General transfer comment: cues for use of UEs to self assist; physical  assist to bring wt up and fwd and to balance in initial standing  Ambulation/Gait Ambulation/Gait assistance: Min assist;+2 physical assistance;+2 safety/equipment Gait Distance (Feet): 5 Feet Assistive device: Bilateral platform walker Gait Pattern/deviations: Step-through pattern;Decreased step length - right;Decreased step length - left;Shuffle;Trunk flexed     General Gait Details: cues for pace, posture and position from EVA walker.  [employer] by [employer]             Wheelchair Mobility    Modified Rankin (Stroke Patients Only)       Balance Overall balance assessment: Needs assistance Sitting-balance support: No upper extremity supported;Feet supported Sitting balance-Leahy Scale: Fair     Standing balance support: Bilateral upper extremity supported Standing balance-Leahy Scale: Poor Standing balance comment: reliant on support                            Cognition Arousal/Alertness: Awake/alert Behavior During Therapy: Anxious Overall Cognitive Status: Within Functional Limits for tasks assessed                                 General Comments: anxious when SOB and discouraged that he is not progressing faster      Exercises      General Comments        Pertinent Vitals/Pain Pain Assessment: No/denies pain    Home Living  Prior Function            PT Goals (current goals can now be found in the care plan section) Acute Rehab PT Goals Patient Stated Goal: Regain as much IND as possible PT Goal Formulation: With patient Time For Goal Achievement: 04/21/20 Potential to Achieve Goals: Fair Progress towards PT goals: Progressing toward goals    Frequency    Min 3X/week      PT Plan Current plan remains appropriate    Co-evaluation PT/OT/SLP Co-Evaluation/Treatment: Yes Reason for Co-Treatment: Complexity of the patient's impairments (multi-system involvement);For  patient/therapist safety;To address functional/ADL transfers PT goals addressed during session: Mobility/safety with mobility OT goals addressed during session: ADL's and self-care      AM-PAC PT "6 Clicks" Mobility   Outcome Measure  Help needed turning from your back to your side while in a flat bed without using bedrails?: A Little Help needed moving from lying on your back to sitting on the side of a flat bed without using bedrails?: A Little Help needed moving to and from a bed to a chair (including a wheelchair)?: A Lot Help needed standing up from a chair using your arms (e.g., wheelchair or bedside chair)?: A Lot Help needed to walk in hospital room?: A Lot Help needed climbing 3-5 steps with a railing? : Total 6 Click Score: 13    End of Session Equipment Utilized During Treatment: Gait belt;Oxygen Activity Tolerance: Patient limited by fatigue Patient left: in chair;with call bell/phone within reach;with chair alarm set Nurse Communication: Mobility status PT Visit Diagnosis: Muscle weakness (generalized) (M62.81)     Time: 0175-1025 PT Time Calculation (min) (ACUTE ONLY): 33 min  Charges:  $Gait Training: 8-22 mins                     [place] Pager [phone] Office [phone]    Zykeem Bauserman 04/11/2020, 12:02 PM

## 2020-04-11 NOTE — Progress Notes (Signed)
PROGRESS NOTE    Steve [name]  JYN:[id number] DOB: 04-23-1955 DOA: 03/23/2020 PCP: [name], PA-C   Chief Complaint  Patient presents with  . Cough  . 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   Assessment & Plan:   Active Problems:   Acute on chronic respiratory failure ([name])   Status post tracheostomy ([place])   On mechanically assisted ventilation ([name])   Pneumothorax on right   Tracheostomy care ([place])   Debility   Anxiety  1 acute on chronic hypoxic respiratory failure in the setting of Covid related pulmonary fibrosis and severe deconditioning/tracheostomy dependence Patient was admitted to the critical care team and transferred to the hospitalist team today 04/08/2020.  Patient currently on ventilator.  Patient has been changed to a cuffless size 6 and per PCCM does not need XLT sizing. Per PCCM greatest challenge at this time is profound deconditioning superimposed on chronic underlying fibrotic lung changes.  Per PCCM patient likely will always be oxygen dependent however does not feel patient will require trach indefinitely and currently needing aggressive rehab and at this point oxygen requirements noted to be a barrier.  Per PCCM okay to titrate supplemental oxygen as high as 50% with activity and noted patient has to be less than 35% to be a CIR candidate.  PCCM feels may need to consider LTAC again.  Continue current bronchial hygiene.  Received first Covid vaccine and due for second Covid vaccine after 04/23/2019.  Status post Lasix 40 mg IV x1.  Per PCCM.   2.  Pneumothorax Resolved.  Per PCCM.  3. Stenotrophomonas in sputum Felt likely secondary to colonization.  Antibiotics  have been discontinued.  Follow.  4.  Well-controlled diabetes mellitus type 2 Hemoglobin A1c 5.7 (03/23/2020).  CBG 155 this morning.  Continue tube feeds.  Sliding scale insulin.   5.  Anxiety Continue Ativan as needed.    6.  History of lymphoma Treated in the spring 2021 prior to Covid.  Will need outpatient follow-up with oncology.  7.  Severe deconditioning/debility Patient noted not to have received physical therapy in several months but feel he can rebound.  PT OT has assessed the patient and recommending CIR and consult placed.  Due to increasing O2 requirements may be a limiting factor.  If CIR not an option per PCCM LTAC may be appropriate.  Continue PT/ OT.  TOC consulted for possible LTAC in case patient gets denied for CIR due to O2 requirements.   8.  Dysphagia Patient noted to desat with modified barium swallow however felt secondary to deconditioning and not related to aspiration.  Continue tube feeds per PEG tube.  9.  Gastro esophageal reflux disease Continue PPI.             DVT prophylaxis: Lovenox Code Status: Full Family Communication: Updated patient.  No family at bedside. Disposition:   Status is: Inpatient    Dispo: The patient is from: Home              Anticipated d/c is to: CIR hopefully versus LTAC               Anticipated d/c date is: To be determined.  Patient currently not medically stable for discharge.  Still with some desats with increasing O2 requirements       Consultants:   PCCM admission  Wound care   [place] [facility] Events   December 13 admission, chest tube placed, trache exchanged 12/15 trach changed to #8 shiley, worsening cuff leak, #6 XLT distal put back in  12/16 trach changed to #8 distal XLT, no cuff leak, removed chest tube 12/20 placed on home vent 12/22 started ATC trials. Did send sputum as had some blood tinge. Having trouble getting home health assistance. 12/23 working on [employer].  Working on [employer]. Plan to change vent to PRN.  12/24 tolerated off vent 12/28 changed to cuffless trach, remains off ventilator 12/29 swallow evaluation completed. No witnessed aspiration but did desaturate due to endurance and deconditioning  Micro Data:  December 13 SARS-CoV-2/influenza negative December 13  MRSA PCR  Neg  December 13 respiratory culture > few gram negative rods>> diptheroids    Procedures:  Tracheostomy present prior to admission 9/13 right chest tube > 9/16  Significant diagnostic tests September 13 chest x-ray showed right-sided pneumothorax, chronic fibrotic changes, tracheostomy in place Bronchoscopy 12/13> no clear tracheal injury  Antimicrobials:  Ceftriaxone   12/13 > 12/17 Levaquin 12/26 > 12/27   Subjective: Patient laying in bed.  States headache improved and denies any further headaches.  No chest pain.  No significant shortness of breath.  No abdominal pain.   Objective: Vitals:   04/11/20 0402 04/11/20 0500 04/11/20 0600 04/11/20 0800  BP:  105/70 115/68   Pulse:  65 85   Resp:  18 (!) 22   Temp:    98.2 F (36.8 C)  TempSrc:    Oral  SpO2:  96% 100%   Weight: 60.4 kg     Height:        Intake/Output Summary (Last 24 hours) at 04/11/2020 0952 Last data filed at 04/11/2020 0700 Gross per 24 hour  Intake 5684 ml  Output 625 ml  Net 5059 ml   Filed Weights   04/09/20 0322 04/10/20 0411 04/11/20 0402  Weight: 60.7 kg 61 kg 60.4 kg    Examination:  General exam: NAD  Respiratory system: Diffuse fine crackles.  No wheezing.  Fair air movement.  Tracheostomy intact.  Cardiovascular system: RRR no murmurs rubs or gallops.  No JVD.  No lower extremity edema.  Gastrointestinal system: Abdomen is soft, nontender, nondistended, positive bowel sounds.  No rebound.  No guarding.  PEG tube in place. Central nervous system: Alert and oriented. No focal neurological deficits. Extremities: Symmetric 5 x 5 power. Skin: No rashes, lesions or  ulcers Psychiatry: Judgement and insight appear normal. Mood & affect appropriate.     Data Reviewed: I have personally reviewed following labs and imaging studies  CBC: Recent Labs  Lab 04/05/20 0502 04/07/20 0505 04/08/20 1042 04/09/20 0326 04/10/20 0408  WBC 9.8 9.7 9.0 7.4  --   NEUTROABS  --   --  5.1 3.8  --   HGB 9.8* 10.1* 10.3* 9.9* 9.7*  HCT 31.4* 32.4* 32.8* 31.9* 30.8*  MCV 91.5 92.3 92.1 93.8  --   PLT 267 252 242 256  --     Basic Metabolic Panel: Recent Labs  Lab 04/07/20 0505 04/08/20 1042 04/09/20 0326 04/10/20 0408 04/11/20 0410  NA 137 136 138 137 138  K 4.3 4.1 4.0 4.0 4.0  CL 97* 96* 96* 95* 96*  CO2 30 31 33* 34* 34*  GLUCOSE 154* 125*  105* 149* 108*  BUN 24* 26* 26* 32* 28*  CREATININE 0.57* 0.67 0.58* 0.61 0.55*  CALCIUM 9.1 9.2 9.2 9.0 9.2  MG 1.8 1.9  --  2.0  --   PHOS 3.6 3.8  --   --   --     GFR: Estimated Creatinine Clearance: 79.7 mL/min (A) (by C-G formula based on SCr of 0.55 mg/dL (L)).  Liver Function Tests: Recent Labs  Lab 04/08/20 1042  ALBUMIN 3.0*    CBG: Recent Labs  Lab 04/10/20 0753 04/10/20 1203 04/10/20 1704 04/10/20 2159 04/11/20 0756  GLUCAP 100* 123* 110* 132* 155*     Recent Results (from the past 240 hour(s))  Culture, respiratory (non-expectorated)     Status: None   Collection Time: 04/01/20  3:40 PM   Specimen: Tracheal Aspirate; Respiratory  Result Value Ref Range Status   Specimen Description   Final    TRACHEAL ASPIRATE Performed at [facility] [address] [place], [place] [address]    Special Requests   Final    NONE Performed at [facility], [place] [address], [place], [place] [address]    Gram Stain   Final    ABUNDANT WBC PRESENT, PREDOMINANTLY PMN ABUNDANT GRAM POSITIVE RODS Performed at [facility], [place] [address] [place], [place] [address]    Culture FEW STENOTROPHOMONAS MALTOPHILIA  Final   Report Status 04/04/2020 FINAL   Final   Organism ID, Bacteria STENOTROPHOMONAS MALTOPHILIA  Final      Susceptibility   Stenotrophomonas maltophilia - MIC*    LEVOFLOXACIN 2 SENSITIVE Sensitive     TRIMETH/SULFA 80 RESISTANT Resistant     * FEW STENOTROPHOMONAS MALTOPHILIA         Radiology Studies: No results found.      Scheduled Meds: . chlorhexidine  15 mL Mouth Rinse BID  . Chlorhexidine Gluconate Cloth  6 each Topical Daily  . enoxaparin (LOVENOX) injection  40 mg Subcutaneous Q24H  . feeding supplement (PROSource TF)  45 mL Per Tube TID  . free water  100 mL Per Tube Q4H  . insulin aspart  0-15 Units Subcutaneous TID WC  . insulin aspart  0-5 Units Subcutaneous QHS  . mouth rinse  15 mL Mouth Rinse q12n4p  . pantoprazole sodium  40 mg Per Tube Daily  . sertraline  50 mg Per Tube Daily  . sodium chloride flush  10-40 mL Intracatheter Q12H   Continuous Infusions: . feeding supplement (OSMOLITE 1.2 CAL) 1,000 mL (04/11/20 0659)     LOS: 19 days    Time spent: 35 minutes    Irine Seal, MD [facility]   To contact the attending provider between 7A-7P or the covering provider during after hours 7P-7A, please log into the web site [email or web address] and access using universal [place] password for that web site. If you do not have the password, please call the hospital operator.  04/11/2020, 9:52 AM

## 2020-04-11 NOTE — PMR Pre-admission (Shared)
PMR Admission Coordinator Pre-Admission Assessment  [name] is an 65 y.o., male MRN: [id number] DOB: 1955/06/12 Height: _0  (165.1 cm) Weight: 60.4 kg  Insurance Information HMO: ***    PPO: ***     PCP: ***     IPA: ***     80/20: ***     OTHER: *** PRIMARY: [employer] [place]      Policy#: ***      Subscriber: wife CM Name: ***      Phone#: ***     Fax#: *** Pre-Cert#: ***      Employer: *** Benefits:  Phone #: ***     Name: *** Eff. Date: ***     Deduct: ***      Out of Pocket Max: ***      Life Max: *** CIR: ***      SNF: *** Outpatient: ***     Co-Pay: *** Home Health: ***      Co-Pay: *** DME: ***     Co-Pay: *** Providers: ***  SECONDARY: none     Policy#:      Phone#:   Financial Counselor:       Phone#:   The "Data Collection Information Summary" for patients in Inpatient Rehabilitation Facilities with attached "Privacy Act [place] Records" was provided and verbally reviewed with: N/A  Emergency Contact Information Contact Information    Name Relation Home Work Mobile   Steve [name] Spouse [phone]     Steve [name] Sister [phone]        Current Medical History  Patient Admitting Diagnosis: debility post COVID  History of Present Illness: 65 year old male with 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  History of sleep apnea on CPAP at home, HTN, history of pancreatitis, GERD and type 2 Diabetes. At Baseline he has lymphoma and had been receiving treatment at the time of his original diagnosis for COVID 05/2019.  He remained vent dependent during that hospitalization and admitted to [facility] until 08/2019 when he was discharged home on [place]. He has been bed bound since then. He was originally home with home hospice until 01/2020 when he was seen in a virtual visit with Dr. [name] and he was noted to desire full scope of care.  Patient presented on 03/23/2020 with complaints of chest tightness and  increased cough. While being suctioned by home health nurse , trach was dislodged and he was brought to ED. Noted on imaging to have a right sided pneumothorax.    Placed on vent, Chest tube was place, and trach exchanged. Chest tube removed 12/16. By 12/20 patient back on home vent settings.  12/22 started ATC trials and PMSV. 12/23 transitioned to vent prn.  12/28 continued to remain off vent and transitioned to cuff less trach. Working with SLP for swallowing evaluations with no witnessed aspiration, but did desat due to his endurance and deconditioning. 12/29 changed to #6 cuff less trach. Pulmonary felt likely able to de cannulate once he his stronger. Likely require Oxygen long term.   Received first COVID vaccine 04/01/2020 while hospitalized and due for second after 04/23/2019.   Antibiotics treated Stenotrophomonas in sputum felt to be colonized. Well controlled DM type 2 with Hgb A1c 5.7. Receiivng tube feeds via PEG.     Patient's medical record from [facility] has been reviewed by the rehabilitation admission coordinator and physician.  Past Medical History  Past Medical History:  Diagnosis Date  . Abscess  of muscle 08/10/2011   staph infection of right hip   . Acute on chronic respiratory failure with hypoxia ([place])   . Acute respiratory distress syndrome (ARDS) due to COVID-19 virus ([place])   . Anxiety   . COVID-19 virus infection   . Diabetes mellitus without complication ([place])   . GERD (gastroesophageal reflux disease)    rare reflux - no meds for reflux - NO PROBLEM IN PAST SEVERAL YRS  . HCAP (healthcare-associated pneumonia)   . Healthcare-associated pneumonia   . Hip dysplasia, congenital    no surgery as a child for hip dysplasia - has had bilateral hip replacements as an adult  . Hodgkin lymphoma ([place])   . Hodgkin's lymphoma ([place])   . Hypertension   . Pancreatitis   . Pneumothorax, acute   . Postoperative anemia due to acute blood loss 09/07/2012  . Septic  arthritis of hip ([place]) 09/05/2012   PT'S TOTAL HIP JOINT REMOVED - ANTIBIOTIC SPACE PLACED AND PT HAS FINISHED IV ANTIBIOTICS ( PICC LINE REMOVED)  . Sleep apnea    USES CPAP    Family History   family history includes Heart attack in his mother; Melanoma in his mother.  Prior Rehab/Hospitalizations Has the patient had prior rehab or hospitalizations prior to admission? Yes [facility] 2/21 then to [facility] until 5/21. Went home eon Hom event until 12/21 Has the patient had major surgery during 100 days prior to admission? Yes   Current Medications  Current Facility-Administered Medications:  .  acetaminophen (TYLENOL) 160 MG/5ML solution 650 mg, 650 mg, Per Tube, Q4H PRN, Simonne Maffucci B, MD, 650 mg at 04/10/20 2202 .  albuterol (PROVENTIL) (2.5 MG/3ML) 0.083% nebulizer solution 2.5 mg, 2.5 mg, Nebulization, Q2H PRN, McQuaid, Douglas B, MD .  chlorhexidine (PERIDEX) 0.12 % solution 15 mL, 15 mL, Mouth Rinse, BID, Rigoberto Noel, MD, 15 mL at 04/10/20 2158 .  Chlorhexidine Gluconate Cloth 2 % PADS 6 each, 6 each, Topical, Daily, Simonne Maffucci B, MD, 6 each at 04/09/20 1254 .  enoxaparin (LOVENOX) injection 40 mg, 40 mg, Subcutaneous, Q24H, McQuaid, Douglas B, MD, 40 mg at 04/10/20 2158 .  feeding supplement (OSMOLITE 1.2 CAL) liquid 1,000 mL, 1,000 mL, Per Tube, Continuous, Tanda Rockers, MD, Last Rate: 65 mL/hr at 04/11/20 0659, 1,000 mL at 04/11/20 0659 .  feeding supplement (PROSource TF) liquid 45 mL, 45 mL, Per Tube, TID, Tanda Rockers, MD, 45 mL at 04/10/20 2158 .  free water 100 mL, 100 mL, Per Tube, Q4H, McQuaid, Douglas B, MD, 100 mL at 04/11/20 0405 .  insulin aspart (novoLOG) injection 0-15 Units, 0-15 Units, Subcutaneous, TID WC, Juanito Doom, MD, 2 Units at 04/10/20 1307 .  insulin aspart (novoLOG) injection 0-5 Units, 0-5 Units, Subcutaneous, QHS, Juanito Doom, MD, 2 Units at 04/10/20 2201 .  ketorolac (TORADOL) 30 MG/ML injection 30 mg, 30 mg, Intravenous,  Q6H PRN, Eugenie Filler, MD .  LORazepam (ATIVAN) tablet 0.5 mg, 0.5 mg, Per Tube, Q4H PRN, Anders Simmonds, MD, 0.5 mg at 04/10/20 2158 .  MEDLINE mouth rinse, 15 mL, Mouth Rinse, q12n4p, Rigoberto Noel, MD, 15 mL at 04/10/20 1309 .  ondansetron (ZOFRAN) injection 4 mg, 4 mg, Intravenous, Q6H PRN, McQuaid, Douglas B, MD .  pantoprazole sodium (PROTONIX) 40 mg/20 mL oral suspension 40 mg, 40 mg, Per Tube, Daily, Simonne Maffucci B, MD, 40 mg at 04/10/20 0923 .  polyethylene glycol (MIRALAX / GLYCOLAX) packet 17 g, 17 g, Per Tube,  Daily PRN, Tanda Rockers, MD, 17 g at 04/07/20 1948 .  sertraline (ZOLOFT) tablet 50 mg, 50 mg, Per Tube, Daily, Simonne Maffucci B, MD, 50 mg at 04/10/20 0923 .  sodium chloride flush (NS) 0.9 % injection 10-40 mL, 10-40 mL, Intracatheter, Q12H, Tanda Rockers, MD, 10 mL at 04/10/20 2212 .  sodium chloride flush (NS) 0.9 % injection 10-40 mL, 10-40 mL, Intracatheter, PRN, Tanda Rockers, MD  Patients Current Diet:  Diet Order            Diet NPO time specified  Diet effective now               Tube feeds via G tube  Precautions / Restrictions Precautions Precautions: Fall Precaution Comments: trach, PEG tube, has a PMV( do not have it in place when mobilizing), needs his sneakers brought in.Per [name], NP, ok to increase to 50% TC Restrictions Weight Bearing Restrictions: No   Has the patient had 2 or more falls or a fall with injury in the past year? No  Prior Activity Level Community (5-7x/wk): prior to COVID 2/21 independent, driving, working. Bedbound since COVID on home vent since 5/21  Prior Functional Level Self Care: Did the patient need help bathing, dressing, using the toilet or eating? Dependent  Indoor Mobility: Did the patient need assistance with walking from room to room (with or without device)? Dependent  Stairs: Did the patient need assistance with internal or external stairs (with or without device)? Dependent  Functional  Cognition: Did the patient need help planning regular tasks such as shopping or remembering to take medications? Dependent  Home Assistive Devices / Equipment Home Assistive Devices/Equipment: Nebulizer,Oxygen,Vent/Trach supplies (peg tube, trach) Home Equipment: Hospital bed  Prior Device Use: Indicate devices/aids used by the patient prior to current illness, exacerbation or injury? bedbound since 05/2019  Current Functional Level Cognition  Overall Cognitive Status: Within Functional Limits for tasks assessed Orientation Level: Oriented X4 General Comments: anxious when SOB    Extremity Assessment (includes Sensation/Coordination)  Upper Extremity Assessment: Defer to OT evaluation RUE Deficits / Details: grossly 3/5 LUE Deficits / Details: grossly 3/5  Lower Extremity Assessment: RLE deficits/detail,LLE deficits/detail RLE Deficits / Details: has 3/5 dorsiflexion, range to neutral.  grossly 3/5 hip/ flexion and kee extension LLE Deficits / Details: lacks  dorsiflexion ROM, hip and knee grossly 3/5    ADLs  Overall ADL's : Needs assistance/impaired Eating/Feeding: NPO (peg tube) Grooming: Wash/dry face,Set up,Sitting Grooming Details (indicate cue type and reason): patient able to maintain sitting balance at EOB in order to wash facec Upper Body Bathing: Bed level,Total assistance Lower Body Bathing: Total assistance,Bed level Upper Body Dressing : Bed level,Total assistance Lower Body Dressing: Total assistance,Bed level Toilet Transfer: Moderate assistance,+2 for safety/equipment,Stand-pivot,BSC (eva walker) Toilet Transfer Details (indicate cue type and reason): to recliner, foot drop L > R and desats to high 60s on 40% FiO2 TC/10L Toileting- Clothing Manipulation and Hygiene: Total assistance,Bed level Functional mobility during ADLs: Maximal assistance,+2 for physical assistance,+2 for safety/equipment General ADL Comments: patient is motivated to participate in therapy,  has required total care at home but shows potential to progress activity tolerance for edge of bed/OOB activity    Mobility  Overal bed mobility: Needs Assistance Bed Mobility: Supine to Sit Supine to sit: Min assist Sit to supine: Min assist General bed mobility comments: increased time; physical assist to bring trunk up from sidely to sit    Transfers  Overall transfer level: Needs assistance Equipment  used: Bilateral platform walker Transfers: Sit to/from Stand Sit to Stand: +2 safety/equipment,+2 physical assistance,Min assist Stand pivot transfers: Mod assist Squat pivot transfers: Mod assist,+2 physical assistance,+2 safety/equipment General transfer comment: cues for use of UEs to self assist; physical assist to bring wt up and fwd and to balance in initial standing    Ambulation / Gait / Stairs / Wheelchair Mobility  Ambulation/Gait Ambulation/Gait assistance: Min assist,+2 physical assistance,+2 safety/equipment Gait Distance (Feet): 5 Feet (5' twice with extended seated rest break between attempts) Assistive device: Bilateral platform walker Gait Pattern/deviations: Step-through pattern,Decreased step length - right,Decreased step length - left,Shuffle,Trunk flexed General Gait Details: cues for pace, posture and position from EVA walker.  Distance ltd by SOB/O2 desat    Posture / Balance Dynamic Sitting Balance Sitting balance - Comments: supports self with UE's. Balance Overall balance assessment: Needs assistance Sitting-balance support: No upper extremity supported,Feet supported Sitting balance-Leahy Scale: Fair Sitting balance - Comments: supports self with UE's. Standing balance support: Bilateral upper extremity supported Standing balance-Leahy Scale: Poor Standing balance comment: reliant on support    Special needs/care consideration 04/08/2020 Glee Arvin flexible #6 uncuffed Jejunostomy tube placed 08/2019    Salley Slaughter, RN  Registered Nurse  WOC   Consult Note    Signed  Date of Service:  04/01/2020 9:35 AM           Signed          Show:Clear all _0 Manual_1 Template_2 Copied  Added by: _3 Salley Slaughter, RN   _4 Hover for details  WOC Nurse Consult Note: Patient receiving care in [place] completed remotely after review of chart and [place] with bedside RN Reason for Consult: granulation tissue at feeding tube site Wound type: Skin breakdown at feeding tube site MASD Pressure Injury POA: NA Drainage (amount, consistency, odor)  Periwound: Dressing procedure/placement/frequency: Gently clean the area with NS, pat dry. Wipe the area with 75M skin barrier wipes [name] # [phone]) allow to dry. May place a small piece of Xeroform gauze directly on the granulation tissue and secure with split gauze and medipore tape. Change daily.  Monitor the wound area(s) for worsening of condition such as: Signs/symptoms of infection, increase in size, development of or worsening of odor, development of pain, or increased pain at the affected locations.   Notify the medical team if any of these develop.  Thank you for the consult. [place] nurse will not follow at this time.   Please re-consult the [place] team if needed.  Cathlean Marseilles Tamala Julian, MSN, RN, [place], Lysle Pearl, [facility] Wound Treatment Associate Pager [phone]               Note Details  Author Salley Slaughter, RN File Time 04/01/2020 9:44 AM  Author Type Registered Nurse Status Signed  Last Editor Salley Slaughter, RN Service [place] # [id number] Admit Date 03/23/2020      Previous Home Environment  Living Arrangements: Spouse/significant other (have 40 and 51 year old children in the home; 2 older children in their 32s)  Lives With: Spouse (63 and 73 year old children) Available Help at Discharge: [place] care attendant,Available 24 hours/day (hired caregivers when wife works) Type of Home: : One level Home Access:  [place] entrance [place] Shower/Tub: Chiropodist: Programmer, systems: Yes [place]: Yes Type of [place]: [place] (if known): [facility] [place] Additional Comments: home vent and hospital bed  Discharge Living Setting Plans  for Discharge Living Setting: Patient's home,Mobile Home,Lives with (comment) (wife and 84 and 42 year old children) Type of Home at Discharge: Mobile home Discharge Home Layout: One level Discharge Home Access: [place] entrance Discharge Bathroom Shower/Tub: Tub/shower unit Discharge Bathroom Toilet: Standard Discharge Bathroom Accessibility: Yes How Accessible: Accessible via walker Does the patient have any problems obtaining your medications?: No  Social/Family/Support Systems Patient Roles: Spouse,Parent Contact Information: wife Anticipated Caregiver: wife, family and hired caregivers Anticipated Ambulance person Information: see above Ability/Limitations of Caregiver: wife works Building control surveyor Availability: 24/7 Discharge Plan Discussed with Primary Caregiver: Yes Is Caregiver In Agreement with Plan?: Yes Does Caregiver/Family have Issues with Lodging/Transportation while Pt is in Rehab?: No  Goals Patient/Family Goal for Rehab: min to mod assist with PT and OT, min SLP Expected length of stay: [facility] 3 to 4 weeks Additional Information: was on Home vent 5 /21 until 12/21 Pt/Family Agrees to Admission and willing to participate: Yes Program Orientation Provided & Reviewed with Pt/Caregiver Including Roles  & Responsibilities: Yes  Decrease burden of Care through IP rehab admission: n/a  Possible need for SNF placement upon discharge: not anticipated  Patient Condition: I have reviewed medical records from [facility], spoken with  patient and spouse. I met with patient at the bedside for inpatient rehabilitation assessment.  Patient will benefit from ongoing PT, OT and  SLP, can actively participate in 3 hours of therapy a day 5 days of the week, and can make measurable gains during the admission.  Patient will also benefit from the coordinated team approach during an Inpatient Acute Rehabilitation admission.  The patient will receive intensive therapy as well as Rehabilitation physician, nursing, social worker, and care management interventions.  Due to bladder management, bowel management, safety, skin/wound care, disease management, medication administration, pain management and patient education the patient requires 24 hour a day rehabilitation nursing.  The patient is currently *** with mobility and basic ADLs.  Discharge setting and therapy post discharge at home with home health is anticipated.  Patient has agreed to participate in the Acute Inpatient Rehabilitation Program and will admit {Time; today/tomorrow:10263}.  Preadmission Screen Completed By: Danne Baxter RN MSN with updates by  Cleatrice Burke, 04/11/2020 9:24 AM ______________________________________________________________________   Discussed status with Dr. Marland Kitchen on *** at *** and received approval for admission today.  Admission [place] RN MSN with updates by  Cleatrice Burke, RN, time Marland KitchenSudie Grumbling ***   Assessment/Plan: Diagnosis: 1. Does the need for close, 24 hr/day Medical supervision in concert with the patient's rehab needs make it unreasonable for this patient to be served in a less intensive setting? {yes_no_potentially:3041433} 2. Co-Morbidities requiring supervision/potential complications: *** 3. Due to {due MP:5361443}, does the patient require 24 hr/day rehab nursing? {yes_no_potentially:3041433} 4. Does the patient require coordinated care of a physician, rehab nurse, PT, OT, and SLP to address physical and functional deficits in the context of the above medical diagnosis(es)? {yes_no_potentially:3041433} Addressing deficits in the following areas:  {deficits:3041436} 5. Can the patient actively participate in an intensive therapy program of at least 3 hrs of therapy 5 days a week? {yes_no_potentially:3041433} 6. The potential for patient to make measurable gains while on inpatient rehab is {potential:3041437} 7. Anticipated functional outcomes upon discharge from inpatient rehab: {functional outcomes:304600100} PT, {functional outcomes:304600100} OT, {functional outcomes:304600100} SLP 8. Estimated rehab length of stay to reach the above functional goals is: *** 9. Anticipated discharge destination: {anticipated dc setting:21604} 10. Overall Rehab/Functional Prognosis: {potential:3041437}   MD Signature: ***

## 2020-04-12 DIAGNOSIS — J9621 Acute and chronic respiratory failure with hypoxia: Secondary | ICD-10-CM | POA: Diagnosis not present

## 2020-04-12 LAB — GLUCOSE, CAPILLARY
Glucose-Capillary: 134 mg/dL — ABNORMAL HIGH (ref 70–99)
Glucose-Capillary: 126 mg/dL — ABNORMAL HIGH (ref 70–99)
Glucose-Capillary: 102 mg/dL — ABNORMAL HIGH (ref 70–99)
Glucose-Capillary: 110 mg/dL — ABNORMAL HIGH (ref 70–99)

## 2020-04-12 LAB — HEMOGLOBIN AND HEMATOCRIT, BLOOD
HCT: 29.2 % — ABNORMAL LOW (ref 39.0–52.0)
Hemoglobin: 9 g/dL — ABNORMAL LOW (ref 13.0–17.0)

## 2020-04-12 LAB — BASIC METABOLIC PANEL
Anion gap: 8 (ref 5–15)
BUN: 26 mg/dL — ABNORMAL HIGH (ref 8–23)
CO2: 32 mmol/L (ref 22–32)
Calcium: 8.7 mg/dL — ABNORMAL LOW (ref 8.9–10.3)
Chloride: 95 mmol/L — ABNORMAL LOW (ref 98–111)
Creatinine, Ser: 0.63 mg/dL (ref 0.61–1.24)
GFR, Estimated: >60 mL/min (ref >60)
Glucose, Bld: 150 mg/dL — ABNORMAL HIGH (ref 70–99)
Potassium: 4.4 mmol/L (ref 3.5–5.1)
Sodium: 135 mmol/L (ref 135–145)

## 2020-04-12 NOTE — Progress Notes (Signed)
PROGRESS NOTE    Steve [name]  UMP:[id number] DOB: 1955-04-24 DOA: 03/23/2020 PCP: [name], PA-C   Chief Complaint  Patient presents with  . Cough  . 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  Assessment & Plan:   Active Problems:   Acute on chronic respiratory failure ([name])   Status post tracheostomy ([place])   On mechanically assisted ventilation ([name])   Pneumothorax on right   Tracheostomy care ([place])   Debility   Anxiety  Acute on chronic hypoxic respiratory failure in the setting of Covid related pulmonary fibrosis and severe deconditioning/tracheostomy dependence - Patient was admitted to the critical care team and transferred to the hospitalist team 12/29.  - Patient remains on ventilator (on home vent at baseline).   - Patient has been changed to a cuffless size 6 and per PCCM does not need XLT sizing. - Per PCCM greatest challenge at this time is profound deconditioning superimposed on chronic underlying fibrotic lung changes, indicate he will always be oxygen dependent however does not feel patient will require trach indefinitely and currently needing aggressive rehab and at this point oxygen requirements noted to be a barrier.  - Supplemental oxygen has to be less than 35% to be a CIR candidate - may need LTAC if this isn't possible. - Received first Covid vaccine and due for second Covid vaccine after 04/23/2019  Pneumothorax - Resolved.  Per PCCM.  Stenotrophomonas in sputum - Felt likely secondary to colonization.  Antibiotics have been discontinued.  Follow.  Well-controlled diabetes mellitus type 2 - Hemoglobin A1c 5.7 (03/23/2020).  CBG 155 this morning.  Continue tube  feeds.  Sliding scale insulin.   Anxiety - Continue Ativan as needed.    History of lymphoma - Treated in the spring 2021 prior to Covid.  Will need outpatient follow-up with oncology.  Severe deconditioning/debility - PT recommending CIR - O2 requirements may be a limiting factor.  If CIR not an option per - PCCM LTAC may be appropriate.  Continue PT/ OT.  TOC consulted for possible LTAC in case patient gets denied for CIR due to O2 requirements.   Dysphagia - Patient noted to desat with modified barium swallow however felt secondary to deconditioning and not related to aspiration.  Continue tube feeds per PEG tube.  Gastro esophageal reflux disease - Continue PPI.    DVT prophylaxis: Lovenox Code Status: Full Family Communication: Updated patient.  No family at bedside. Disposition:   Status is: Inpatient  Dispo: The patient is from: Home              Anticipated d/c is to: CIR hopefully versus LTAC pending oxygen requirements              Anticipated d/c date is: To be determined.              Patient currently not medically stable for discharge.  Still with some desats with increasing O2 requirements       Consultants:   PCCM admission  Wound care   Story Hospital Events   December 13 admission, chest tube placed, trache exchanged 12/15 trach changed to #8 shiley, worsening cuff leak, #6 XLT distal put back in  12/16 trach  changed to #8 distal XLT, no cuff leak, removed chest tube 12/20 placed on home vent 12/22 started ATC trials. Did send sputum as had some blood tinge. Having trouble getting home health assistance. 12/23 working on [employer]. Working on [employer]. Plan to change vent to PRN.  12/24 tolerated off vent 12/28 changed to cuffless trach, remains off ventilator 12/29 swallow evaluation completed. No witnessed aspiration but did desaturate due to endurance and deconditioning  Micro Data:  December 13 SARS-CoV-2/influenza negative December 13  MRSA PCR   Neg  December 13 respiratory culture > few gram negative rods>> diptheroids    Procedures:  Tracheostomy present prior to admission 9/13 right chest tube > 9/16  Significant diagnostic tests September 13 chest x-ray showed right-sided pneumothorax, chronic fibrotic changes, tracheostomy in place Bronchoscopy 12/13> no clear tracheal injury  Antimicrobials:  Ceftriaxone   12/13 > 12/17 Levaquin 12/26 > 12/27   Subjective: Patient laying in bed.  States headache improved and denies any further headaches.  No chest pain.  No significant shortness of breath.  No abdominal pain.   Objective: Vitals:   04/12/20 0500 04/12/20 0600 04/12/20 0750 04/12/20 0820  BP:      Pulse: 92 87 83 80  Resp: (!) 30 20  (!) 30  Temp:    98.4 F (36.9 C)  TempSrc:    Oral  SpO2: (!) 86% 98% 96% 96%  Weight:      Height:        Intake/Output Summary (Last 24 hours) at 04/12/2020 1021 Last data filed at 04/12/2020 0659 Gross per 24 hour  Intake 680 ml  Output 1680 ml  Net -1000 ml   Filed Weights   04/10/20 0411 04/11/20 0402 04/12/20 0425  Weight: 61 kg 60.4 kg 60.1 kg    Examination:  General exam: NAD  Respiratory system: Diffuse fine crackles.  No wheezing.  Fair air movement.  Tracheostomy intact.  Cardiovascular system: RRR no murmurs rubs or gallops.  No JVD.  No lower extremity edema.  Gastrointestinal system: Abdomen is soft, nontender, nondistended, positive bowel sounds.  No rebound.  No guarding.  PEG tube in place. Central nervous system: Alert and oriented. No focal neurological deficits. Extremities: Symmetric 5 x 5 power. Skin: No rashes, lesions or ulcers Psychiatry: Judgement and insight appear normal. Mood & affect appropriate.     Data Reviewed: I have personally reviewed following labs and imaging studies  CBC: Recent Labs  Lab 04/07/20 0505 04/08/20 1042 04/09/20 0326 04/10/20 0408 04/12/20 0232  WBC 9.7 9.0 7.4  --   --   NEUTROABS  --  5.1 3.8  --   --    HGB 10.1* 10.3* 9.9* 9.7* 9.0*  HCT 32.4* 32.8* 31.9* 30.8* 29.2*  MCV 92.3 92.1 93.8  --   --   PLT 252 242 256  --   --     Basic Metabolic Panel: Recent Labs  Lab 04/07/20 0505 04/08/20 1042 04/09/20 0326 04/10/20 0408 04/11/20 0410 04/12/20 0232  NA 137 136 138 137 138 135  K 4.3 4.1 4.0 4.0 4.0 4.4  CL 97* 96* 96* 95* 96* 95*  CO2 30 31 33* 34* 34* 32  GLUCOSE 154* 125* 105* 149* 108* 150*  BUN 24* 26* 26* 32* 28* 26*  CREATININE 0.57* 0.67 0.58* 0.61 0.55* 0.63  CALCIUM 9.1 9.2 9.2 9.0 9.2 8.7*  MG 1.8 1.9  --  2.0  --   --   PHOS 3.6 3.8  --   --   --   --  GFR: Estimated Creatinine Clearance: 79.3 mL/min (by C-G formula based on SCr of 0.63 mg/dL).  Liver Function Tests: Recent Labs  Lab 04/08/20 1042  ALBUMIN 3.0*    CBG: Recent Labs  Lab 04/11/20 0756 04/11/20 1159 04/11/20 1654 04/11/20 2138 04/12/20 0812  GLUCAP 155* 126* 108* 117* 134*     No results found for this or any previous visit (from the past 240 hour(s)).   Radiology Studies: No results found.   Scheduled Meds: . chlorhexidine  15 mL Mouth Rinse BID  . Chlorhexidine Gluconate Cloth  6 each Topical Daily  . enoxaparin (LOVENOX) injection  40 mg Subcutaneous Q24H  . feeding supplement (PROSource TF)  45 mL Per Tube TID  . free water  100 mL Per Tube Q4H  . insulin aspart  0-15 Units Subcutaneous TID WC  . insulin aspart  0-5 Units Subcutaneous QHS  . mouth rinse  15 mL Mouth Rinse q12n4p  . pantoprazole sodium  40 mg Per Tube Daily  . sertraline  50 mg Per Tube Daily  . sodium chloride flush  10-40 mL Intracatheter Q12H   Continuous Infusions: . feeding supplement (OSMOLITE 1.2 CAL) 65 mL/hr at 04/12/20 0404     LOS: 20 days   Time spent: 23 minutes  Little Ishikawa, DO [facility]  To contact the attending provider between 7A-7P or the covering provider during after hours 7P-7A, please log into the web site [email or web address] and access using universal  [place] password for that web site. If you do not have the password, please call the hospital operator.  04/12/2020, 10:21 AM

## 2020-04-13 DIAGNOSIS — Z930 Tracheostomy status: Secondary | ICD-10-CM | POA: Diagnosis not present

## 2020-04-13 DIAGNOSIS — J80 Acute respiratory distress syndrome: Secondary | ICD-10-CM | POA: Diagnosis not present

## 2020-04-13 DIAGNOSIS — J9621 Acute and chronic respiratory failure with hypoxia: Secondary | ICD-10-CM | POA: Diagnosis not present

## 2020-04-13 LAB — GLUCOSE, CAPILLARY
Glucose-Capillary: 135 mg/dL — ABNORMAL HIGH (ref 70–99)
Glucose-Capillary: 155 mg/dL — ABNORMAL HIGH (ref 70–99)
Glucose-Capillary: 133 mg/dL — ABNORMAL HIGH (ref 70–99)
Glucose-Capillary: 91 mg/dL (ref 70–99)

## 2020-04-13 LAB — CREATININE, SERUM
Creatinine, Ser: 0.57 mg/dL — ABNORMAL LOW (ref 0.61–1.24)
GFR, Estimated: >60 mL/min (ref >60)

## 2020-04-13 NOTE — Progress Notes (Signed)
Progress Note    Steve [name]  HER:[id number] DOB: 1955-06-10  DOA: 03/23/2020 PCP: [name], PA-C      Brief Narrative:    Medical records reviewed and are as summarized below:  Steve [name] is a 65 y.o. 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      Assessment/Plan:   Active Problems:   Acute on chronic respiratory failure ([place])   Status post tracheostomy ([place])   On mechanically assisted ventilation ([name])   Pneumothorax on right   Tracheostomy care ([place])   Debility   Anxiety   Nutrition Problem: Moderate Malnutrition Etiology: chronic illness (lymphoma)  Signs/Symptoms: moderate fat depletion,moderate muscle depletion,severe muscle depletion   Body mass index is 22.01 kg/m.       Acute on chronic hypoxic respiratory failure in the setting of Covid related pulmonary fibrosis and severe deconditioning/tracheostomy dependence - Patient was admitted to the critical care team and transferred to the hospitalist team 12/29.  - Patient remains on ventilator (on home vent at baseline).   - Patient has been changed to a cuffless size 6 and per PCCM does not need XLT sizing. - Per PCCM greatest challenge at this time is profound deconditioning superimposed on chronic underlying fibrotic lung changes, indicate he will always be oxygen dependent however does not feel patient will require trach indefinitely and currently needing aggressive rehab and at this point oxygen requirements noted to be a barrier.  - Supplemental oxygen has to be less than 35% to be a CIR candidate - may need LTAC if this isn't possible.  He is on FiO2 of 35% at 8 L/min. - Received first Covid vaccine and due for second Covid vaccine after  04/23/2019  Pneumothorax - Resolved.  Per PCCM.  Stenotrophomonas in sputum - Felt likely secondary to colonization.  Antibiotics have been discontinued.  Follow.  Well-controlled diabetes mellitus type 2 - Hemoglobin A1c 5.7 (03/23/2020).   Continue tube feeds.  Sliding scale insulin.   Anxiety - Continue Ativan as needed.    History of lymphoma - Treated in the spring 2021 prior to Covid.  Will need outpatient follow-up with oncology.  Severe deconditioning/debility - PT recommending CIR - O2 requirements may be a limiting factor.  If CIR not an option per - PCCM LTAC may be appropriate.  Continue PT/ OT.  TOC consulted for possible LTAC in case patient gets denied for CIR due to O2 requirements.   Dysphagia - Patient noted to desat with modified barium swallow however felt secondary to deconditioning and not related to aspiration.  Continue tube feeds per PEG tube.  Gastro esophageal reflux disease - Continue PPI.      Diet Order            Diet NPO time specified  Diet effective now                    Consultants:  PCCM admission    Procedures:  Tracheostomy present on prior to admission  Right chest tube placed on 03/23/2020 for pneumothorax  Bronchoscopy on 03/23/2020 and 03/25/2020 for dislodged tracheostomy tube and repositioning of tracheostomy respectively    Medications:   . chlorhexidine  15 mL Mouth Rinse BID  . Chlorhexidine Gluconate Cloth  6 each  Topical Daily  . enoxaparin (LOVENOX) injection  40 mg Subcutaneous Q24H  . feeding supplement (PROSource TF)  45 mL Per Tube TID  . free water  100 mL Per Tube Q4H  . insulin aspart  0-15 Units Subcutaneous TID WC  . insulin aspart  0-5 Units Subcutaneous QHS  . mouth rinse  15 mL Mouth Rinse q12n4p  . pantoprazole sodium  40 mg Per Tube Daily  . sertraline  50 mg Per Tube Daily  . sodium chloride flush  10-40 mL Intracatheter Q12H   Continuous Infusions: . feeding supplement  (OSMOLITE 1.2 CAL) 1,000 mL (04/12/20 1048)     Anti-infectives (From admission, onward)   Start     Dose/Rate Route Frequency Ordered Stop   04/04/20 1300  levofloxacin (LEVAQUIN) tablet 750 mg  Status:  Discontinued        750 mg Per Tube Daily 04/04/20 1149 04/06/20 0934   03/23/20 1200  cefTRIAXone (ROCEPHIN) 2 g in sodium chloride 0.9 % 100 mL IVPB        2 g 200 mL/hr over 30 Minutes Intravenous Every 24 hours 03/23/20 1152 03/27/20 1258             Family Communication/Anticipated D/C date and plan/Code Status   DVT prophylaxis: enoxaparin (LOVENOX) injection 40 mg Start: 03/23/20 2200     Code Status: Full Code  Family Communication: None Disposition Plan:    Status is: Inpatient  Remains inpatient appropriate because:Unsafe d/c plan and Inpatient level of care appropriate due to severity of illness   Dispo: The patient is from: Home              Anticipated d/c is to: CIR versus LTAC              Anticipated d/c date is: 3 days              Patient currently is not medically stable to d/c.           Subjective:   Interval events noted.  No shortness of breath or chest pain.  Objective:    Vitals:   04/13/20 0600 04/13/20 0800 04/13/20 0801 04/13/20 0854  BP:  114/67    Pulse: 85 82 84   Resp:  (!) 25 (!) 24   Temp:  97.9 F (36.6 C)    TempSrc:  Oral    SpO2: 92% 96% 97% 95%  Weight:      Height:       No data found.   Intake/Output Summary (Last 24 hours) at 04/13/2020 0911 Last data filed at 04/13/2020 0802 Gross per 24 hour  Intake 1560.5 ml  Output 800 ml  Net 760.5 ml   Filed Weights   04/11/20 0402 04/12/20 0425 04/13/20 0339  Weight: 60.4 kg 60.1 kg 60 kg    Exam:  GEN: NAD SKIN: Warm and dry EYES: EOMI ENT: MMM, +trach CV: RRR PULM: CTA B ABD: soft, ND, NT, +BS, +PEG tube CNS: AAO x 3, non focal EXT: No edema or tenderness   Data Reviewed:   I have personally reviewed following labs and imaging  studies:  Labs: Labs show the following:   Basic Metabolic Panel: Recent Labs  Lab 04/07/20 0505 04/08/20 1042 04/09/20 0326 04/10/20 0408 04/11/20 0410 04/12/20 0232 04/13/20 0328  NA 137 136 138 137 138 135  --   K 4.3 4.1 4.0 4.0 4.0 4.4  --   CL 97* 96* 96* 95* 96* 95*  --  CO2 30 31 33* 34* 34* 32  --   GLUCOSE 154* 125* 105* 149* 108* 150*  --   BUN 24* 26* 26* 32* 28* 26*  --   CREATININE 0.57* 0.67 0.58* 0.61 0.55* 0.63 0.57*  CALCIUM 9.1 9.2 9.2 9.0 9.2 8.7*  --   MG 1.8 1.9  --  2.0  --   --   --   PHOS 3.6 3.8  --   --   --   --   --    GFR Estimated Creatinine Clearance: 79.2 mL/min (A) (by C-G formula based on SCr of 0.57 mg/dL (L)). Liver Function Tests: Recent Labs  Lab 04/08/20 1042  ALBUMIN 3.0*   No results for input(s): LIPASE, AMYLASE in the last 168 hours. No results for input(s): AMMONIA in the last 168 hours. Coagulation profile No results for input(s): INR, PROTIME in the last 168 hours.  CBC: Recent Labs  Lab 04/07/20 0505 04/08/20 1042 04/09/20 0326 04/10/20 0408 04/12/20 0232  WBC 9.7 9.0 7.4  --   --   NEUTROABS  --  5.1 3.8  --   --   HGB 10.1* 10.3* 9.9* 9.7* 9.0*  HCT 32.4* 32.8* 31.9* 30.8* 29.2*  MCV 92.3 92.1 93.8  --   --   PLT 252 242 256  --   --    Cardiac Enzymes: No results for input(s): CKTOTAL, CKMB, CKMBINDEX, TROPONINI in the last 168 hours. BNP (last 3 results) No results for input(s): PROBNP in the last 8760 hours. CBG: Recent Labs  Lab 04/12/20 0812 04/12/20 1342 04/12/20 1636 04/12/20 2138 04/13/20 0759  GLUCAP 134* 126* 102* 110* 135*   D-Dimer: No results for input(s): DDIMER in the last 72 hours. Hgb A1c: No results for input(s): HGBA1C in the last 72 hours. Lipid Profile: No results for input(s): CHOL, HDL, LDLCALC, TRIG, CHOLHDL, LDLDIRECT in the last 72 hours. Thyroid function studies: No results for input(s): TSH, T4TOTAL, T3FREE, THYROIDAB in the last 72 hours.  Invalid input(s):  FREET3 Anemia work up: No results for input(s): VITAMINB12, FOLATE, FERRITIN, TIBC, IRON, RETICCTPCT in the last 72 hours. Sepsis Labs: Recent Labs  Lab 04/07/20 0505 04/08/20 1042 04/09/20 0326  WBC 9.7 9.0 7.4    Microbiology No results found for this or any previous visit (from the past 240 hour(s)).  Procedures and diagnostic studies:  No results found.             LOS: 21 days   Nice Copywriter, advertising on [email or web address]. If 7PM-7AM, please contact night-coverage at [email or web address]     04/13/2020, 9:11 AM

## 2020-04-13 NOTE — Progress Notes (Signed)
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  Baseline = bed bound, able to sit on side of bed only/ wife main caretaker  Past Medical History  Sleep apnea, on CPAP at home Post Covid pneumonia pulmonary fibrosis, on mechanical ventilation Chronic respiratory failure with hypoxemia on mechanical ventilation at home Hodgkin's lymphoma Hypertension History of pancreatitis Gastroesophageal reflux disease Diabetes mellitus type 2  Significant Hospital Events   12/13 Admit with PTX, chest tube placed, trach exchanged 12/15 trach changed to #8 shiley, worsening cuff leak, #6 XLT distal put back in  12/16 trach changed to #8 distal XLT, no cuff leak, removed chest tube 12/20 placed on home vent 12/22 ATC trial started, sputum cultured as had some blood tinge. Having trouble getting home health assistance. 12/23 working on [employer]. Working on [employer]. Plan to change vent to PRN.  12/24 tolerated off vent 12/28 changed to cuffless trach, remains off ventilator 12/29 swallow evaluation completed. No witnessed aspiration but did desaturate due to endurance and deconditioning. Change to size 6 regular cuffless 12/31 no overnight events 1/03 Remains off vent, on 35% ATC   Consults:    Procedures:  Tracheostomy present prior to admission R Chest tube 12/13 >> 12/16  Significant Diagnostic Tests:   CXR 12/13 >> right-sided pneumothorax, chronic fibrotic changes, tracheostomy in place  Bronchoscopy 12/13 >> no clear tracheal injury  Micro Data:  COVID 12/13 >> negative   Influenza 12/13 >> Negative  MRSA PCR 12/13 >> negative  Resp Culture 12/13 >> diptheroids   Antimicrobials:  Ceftriaxone 12/13 > 12/17 Levaquin 12/26 > 12/27  Interim history/subjective:  Off vent since 12/24, on 35% ATC Denies acute complaints   Objective   Blood pressure 114/67, pulse 84, temperature 97.9 F (36.6 C), temperature source Oral, resp. rate (!) 24, height 5\' 5"  (1.651 m), weight 60 kg, SpO2 95 %.    FiO2 (%):  [35 %] 35 %   Intake/Output Summary (Last 24 hours) at 04/13/2020 0913 Last data filed at 04/13/2020 0802 Gross per 24 hour  Intake 1560.5 ml  Output 800 ml  Net 760.5 ml   Filed Weights   04/11/20 0402 04/12/20 0425 04/13/20 0339  Weight: 60.4 kg 60.1 kg 60 kg    Examination:  General: chronically ill appearing adult male lying in bed in NAD HEENT: MM pink/moist, trach midline c/d/i, talks around trach  Neuro: awakens to voice, speech clear, appropriate, generalized weakness but overall improved   CV: s1s2 RRR, no m/r/g PULM: non-labored on ATC, lungs bilaterally coarse  GI: soft, bsx4 active, PEG in place Extremities: warm/dry, no edema  Skin: no rashes or lesions  Resolved Hospital Problem list   Tachycardia: resolved Right pneumothorax > resolved CAP stopped 12/17 Acute on chronic hypoxic respiratory failure Resolved ventilator dependence  Assessment & Plan:   Chronic Hypoxic Respiratory Failure Post Covid Fibrosis Severe Deconditioning Anxiety Diabetes History of Lymphoma Dysphagia  -wean O2 for sats >90% -will likely require long term O2  -push mobilization / OOB daily  -PT efforts will be key for overall improvement  -  supportive measures > nutrition etc -would not consider decannulation until he is stronger but it is a possibility  -ok to transfer to medical floor from pulmonary standpoint -will need pulmonary follow up post discharge / trach clinic with [name], ACNP   Noe Gens, MSN, NP-C, AGACNP-BC [place]  [facility] 04/13/2020, 10:10 AM   Please see [email or web address] for pager details.

## 2020-04-13 NOTE — Telephone Encounter (Signed)
Yes absolutely

## 2020-04-13 NOTE — Telephone Encounter (Signed)
Forwarding to [place] to make aware- it appears that the patient will have to come off hospice before any home health company will process this order.  We have verification from one company documented below that they will work with patient once he is on palliative care, but this order cannot be processed further until patient is discharged from hospice.  Please advise- do you want [place] to place an order to d/c hospice and transition to palliative care to start this process? Thanks!

## 2020-04-13 NOTE — TOC Progression Note (Signed)
Transition of Care [facility]) - Progression Note    Patient Details  Name: Steve [name] MRN: [id number] Date of Birth: 1955-04-29  Transition of Care [facility]) CM/SW Contact  Joaquin Courts, RN Phone Number: 04/13/2020, 10:44 AM  Clinical Narrative:    CM followed up with CIR.  Rep reports they are following patient but would need for patient to show a higher tolerance for therapy before they can start auth with his insurance.  CM did reach out to [facility] to see if reps can review patient for LTAC in case CIR does not work out.    Expected Discharge Plan: IP Rehab Facility Barriers to Discharge: No Barriers Identified  Expected Discharge Plan and Services Expected Discharge Plan: [place]   Discharge Planning Services: CM Consult   Living arrangements for the past 2 months: Mobile Home                                       Social Determinants of Health (SDOH) Interventions    Readmission Risk Interventions Readmission Risk Prevention Plan 07/22/2019  Transportation Screening Complete  Medication Review Press photographer) Complete  Some recent data might be hidden

## 2020-04-13 NOTE — Progress Notes (Signed)
Inpatient Rehab Admissions Coordinator:   Note pt down to 8L on 35% trach collar at rest.  Will continue to follow as pt shows improvement in tolerance for therapy on decreasing oxygen needs.   Shann Medal, PT, DPT Admissions Coordinator [phone] 04/13/20  10:12 AM

## 2020-04-13 NOTE — Progress Notes (Signed)
PT Cancellation Note  Patient Details Name: Steve [name] MRN: [id number] DOB: 15-Apr-1955   Cancelled Treatment:    Reason Eval/Treat Not Completed: Fatigue/lethargy limiting ability to participate. Pt tired, had ativan earlier. [place] is requesting to sleep. Will continue efforts.    [facility] 04/13/2020, 11:52 AM

## 2020-04-14 DIAGNOSIS — J9601 Acute respiratory failure with hypoxia: Secondary | ICD-10-CM | POA: Diagnosis not present

## 2020-04-14 DIAGNOSIS — J9621 Acute and chronic respiratory failure with hypoxia: Secondary | ICD-10-CM | POA: Diagnosis not present

## 2020-04-14 DIAGNOSIS — J939 Pneumothorax, unspecified: Secondary | ICD-10-CM | POA: Diagnosis not present

## 2020-04-14 DIAGNOSIS — R5381 Other malaise: Secondary | ICD-10-CM | POA: Diagnosis not present

## 2020-04-14 LAB — GLUCOSE, CAPILLARY
Glucose-Capillary: 143 mg/dL — ABNORMAL HIGH (ref 70–99)
Glucose-Capillary: 124 mg/dL — ABNORMAL HIGH (ref 70–99)
Glucose-Capillary: 130 mg/dL — ABNORMAL HIGH (ref 70–99)
Glucose-Capillary: 129 mg/dL — ABNORMAL HIGH (ref 70–99)

## 2020-04-14 NOTE — Progress Notes (Signed)
Chaplain engaged in initial visit with, introducing herself, explaining role and offering support.  Chaplain will follow-up.    04/14/20 1200  Clinical Encounter Type  Visited With Patient  Visit Type Initial

## 2020-04-14 NOTE — Progress Notes (Signed)
Inpatient Rehab Admissions Coordinator:   Per PT note pt mobilizing well at w/c level but continues to require 40% trach collar, which is outside of the abilities of CIR.  Will continue to follow for improved tolerance (at most 35% trach collar, no more than 10L with mobility, and maintaining O2 sats >80%). Per case manager, LTACH work up also in progress.   Shann Medal, PT, DPT Admissions Coordinator [phone] 04/14/20  12:58 PM

## 2020-04-14 NOTE — Progress Notes (Signed)
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 S/p chest tube and Tracheostomy Exchange 12/13. Patient on trach collar/vent PRN.    PT Comments    Pt up in recliner on arrival.  Pt very agreeable to w/c mobility and RN approved off unit.  Pt able to propel w/c 300 feet total with several rest breaks for breathing.  Pt also appears in better mood being able to mobilize outside of his room.   Follow Up Recommendations  CIR     Equipment Recommendations  Wheelchair (measurements PT);Wheelchair cushion (measurements PT)    Recommendations for Other Services       Precautions / Restrictions Precautions Precautions: Fall Precaution Comments: trach, PEG tube, has a PMV (do not have it in place when mobilizing), needs his sneakers brought in. Per [name], NP, ok to increase to 50% TC    Mobility  Bed Mobility               General bed mobility comments: pt in recliner on arrival  Transfers Overall transfer level: Needs assistance Equipment used: 2 person hand held assist Transfers: Sit to/from Stand;Lateral/Scoot Transfers Sit to Stand: +2 safety/equipment;+2 physical assistance;Mod assist Stand pivot transfers: Mod assist;+2 physical assistance      Lateral/Scoot Transfers: Max assist;+2 physical assistance General transfer comment: cues for use of UEs to self assist; assist for weight shifting and weakness, mod assist for rise and steady with sit to stand and then fatigued from w/c so required max assist for lateral scoot back into recliner  Ambulation/Gait                  Theme park manager mobility: Yes Wheelchair propulsion: Both upper extremities Wheelchair parts: Supervision/cueing Distance: 300 ft total Wheelchair Assistance Details (indicate cue type and reason): verbal cues for use of w/c and propulsion, cues for rest breaks (required 5-6 rest breaks), SpO2 86-92% on 10L 40% trach collar (set up per RN)  Modified Rankin (Stroke Patients Only)       Balance Overall balance assessment: Needs assistance         Standing balance support: Bilateral upper extremity supported Standing balance-Leahy Scale: Poor Standing balance comment: reliant on support                            Cognition Arousal/Alertness: Awake/alert Behavior During Therapy: Anxious Overall Cognitive Status: Within Functional Limits for tasks assessed                                 General Comments: anxious when SOB      Exercises      General Comments        Pertinent Vitals/Pain Pain Assessment: No/denies pain    Home Living                      Prior Function  PT Goals (current goals can now be found in the care plan section) Progress towards PT goals: Progressing toward goals    Frequency    Min 3X/week      PT Plan Current plan remains appropriate    Co-evaluation              AM-PAC PT "6 Clicks" Mobility   Outcome Measure  Help needed turning from your back to your side while in a flat bed without using bedrails?: A Little Help needed moving from lying on your back to sitting on the side of a flat bed without using bedrails?: A Little Help needed moving to and from a bed to a chair (including a wheelchair)?: A Lot Help needed standing up from a chair using your arms (e.g., wheelchair or bedside chair)?: A Lot Help needed to walk in hospital room?: A Lot Help needed climbing 3-5 steps with a railing? : Total 6 Click  Score: 13    End of Session Equipment Utilized During Treatment: Gait belt;Oxygen Activity Tolerance: Patient tolerated treatment well Patient left: in chair;with call bell/phone within reach Nurse Communication: Mobility status PT Visit Diagnosis: Muscle weakness (generalized) (M62.81)     Time: 4944-9675 PT Time Calculation (min) (ACUTE ONLY): 33 min  Charges:  $Therapeutic Activity: 8-22 mins $Wheel Chair Management: 8-22 mins                     Arlyce Dice, DPT Acute Rehabilitation Services Pager: [phone] Office: [phone]  York Ram E 04/14/2020, 12:52 PM

## 2020-04-14 NOTE — Progress Notes (Signed)
Nutrition Follow-up  DOCUMENTATION CODES:   Non-severe (moderate) malnutrition in context of chronic illness  INTERVENTION:  - continue Osmolite 1.2 @ 65 ml/hr with 45 ml Prosource TF TID, and 100 mg free water every 4 hours.   NUTRITION DIAGNOSIS:   Moderate Malnutrition related to chronic illness (lymphoma) as evidenced by moderate fat depletion,moderate muscle depletion,severe muscle depletion. 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 Patient presented to the ED due to 2-day hx of chest tightness and increased cough. He was found to have malpositioned trach and new R-sided pneumothorax.  Patient now off the vent and is on trach collar. Patient has PEG and is receiving Osmolite 1.2 @ 65 ml/hr with 45 ml Prosource TF TID, and 100 mg free water every 4 hours. This regimen is providing 1992 kcal, 119 grams protein, and 1879 ml free water.  Patient is currently sitting in the chair and denies any abdominal discomfort or nausea. No concerns related to TF.   Weight has been stable throughout hospitalization; admission date of 12/13. No edema present at this time.     Labs reviewed; CBG: 143 mg/dl, creatinine: 0.57 mg/dl. Medications reviewed; sliding scale novolog, 40 mg protonix per PEG/day.   Diet Order:   Diet Order            Diet NPO time specified  Diet effective now                 EDUCATION NEEDS:   Education needs have been addressed  Skin:  Skin Assessment: Reviewed RN Assessment Skin Integrity Issues:: Stage I,Other (Comment) Stage I: bilateral heels Unstageable: full thickness to mid coccyx Other: MASD at feeding tube site  Last BM:  1/2  Height:   Ht  Readings from Last 1 Encounters:  03/23/20 _0  (1.651 m)    Weight:   Wt Readings from Last 1 Encounters:  04/13/20 60 kg    Ideal Body Weight:  61.8 kg  BMI:  Body mass index is 22.01 kg/m.  Estimated Nutritional Needs:   Kcal:  1990-2175 kcal  Protein:  105-120 grams  Fluid:  >/= 2.1 L/day     Jarome Matin, MS, RD, LDN, CNSC Inpatient Clinical Dietitian RD pager # available in AMION  After hours/weekend pager # available in [facility]

## 2020-04-14 NOTE — Progress Notes (Signed)
Progress Note    Steve [name]  EUM:[id number] DOB: 07-Oct-1955  DOA: 03/23/2020 PCP: [name], PA-C      Brief Narrative:    Medical records reviewed and are as summarized below:  Steve [name] is a 65 y.o. 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      Assessment/Plan:   Active Problems:   Acute on chronic respiratory failure ([name])   Status post tracheostomy ([place])   On mechanically assisted ventilation ([name])   ARDS (adult respiratory distress syndrome) ([name])   Pneumothorax on right   Tracheostomy care ([place])   Debility   Anxiety   Nutrition Problem: Moderate Malnutrition Etiology: chronic illness (lymphoma)  Signs/Symptoms: moderate fat depletion,moderate muscle depletion,severe muscle depletion   Body mass index is 22.01 kg/m.       Acute on chronic hypoxic respiratory failure in the setting of Covid related pulmonary fibrosis and severe deconditioning/tracheostomy dependence - Patient was admitted to the critical care team and transferred to the hospitalist team 12/29.  - Patient remains on ventilator (on home vent at baseline).   - Patient has been changed to a cuffless size 6 and per PCCM does not need XLT sizing. - Per PCCM greatest challenge at this time is profound deconditioning superimposed on chronic underlying fibrotic lung changes, indicate he will always be oxygen dependent however does not feel patient will require trach indefinitely and currently needing aggressive rehab and at this point oxygen requirements noted to be a barrier.  - Supplemental oxygen has to be less than 35% to be a CIR candidate - may need LTAC if this isn't possible.  He is on FiO2 of 35% at 8 L/min. - Received first Covid vaccine and  due for second Covid vaccine after 04/23/2019  Pneumothorax - Resolved.  Per PCCM.  Stenotrophomonas in sputum - Felt likely secondary to colonization.  Antibiotics have been discontinued.  Follow.  Well-controlled diabetes mellitus type 2 - Hemoglobin A1c 5.7 (03/23/2020).   Continue tube feeds.  Sliding scale insulin.   Anxiety - Continue Ativan as needed.    History of lymphoma - Treated in the spring 2021 prior to Covid.  Will need outpatient follow-up with oncology.  Severe deconditioning/debility - PT recommending CIR - O2 requirements may be a limiting factor.  If CIR not an option per - PCCM LTAC may be appropriate.  Continue PT/ OT.  TOC consulted for possible LTAC in case patient gets denied for CIR due to O2 requirements.   Dysphagia - Patient noted to desat with modified barium swallow however felt secondary to deconditioning and not related to aspiration.  Continue tube feeds per PEG tube.  Gastro esophageal reflux disease - Continue PPI.      Diet Order            Diet NPO time specified  Diet effective now                    Consultants:  PCCM admission    Procedures:  Tracheostomy present on prior to admission  Right chest tube placed on 03/23/2020 for pneumothorax  Bronchoscopy on 03/23/2020 and 03/25/2020 for dislodged tracheostomy tube and repositioning of tracheostomy respectively    Medications:   . chlorhexidine  15 mL Mouth Rinse BID  .  Chlorhexidine Gluconate Cloth  6 each Topical Daily  . enoxaparin (LOVENOX) injection  40 mg Subcutaneous Q24H  . feeding supplement (PROSource TF)  45 mL Per Tube TID  . free water  100 mL Per Tube Q4H  . insulin aspart  0-15 Units Subcutaneous TID WC  . insulin aspart  0-5 Units Subcutaneous QHS  . mouth rinse  15 mL Mouth Rinse q12n4p  . pantoprazole sodium  40 mg Per Tube Daily  . sertraline  50 mg Per Tube Daily  . sodium chloride flush  10-40 mL Intracatheter Q12H   Continuous  Infusions: . feeding supplement (OSMOLITE 1.2 CAL) 1,000 mL (04/14/20 0953)     Anti-infectives (From admission, onward)   Start     Dose/Rate Route Frequency Ordered Stop   04/04/20 1300  levofloxacin (LEVAQUIN) tablet 750 mg  Status:  Discontinued        750 mg Per Tube Daily 04/04/20 1149 04/06/20 0934   03/23/20 1200  cefTRIAXone (ROCEPHIN) 2 g in sodium chloride 0.9 % 100 mL IVPB        2 g 200 mL/hr over 30 Minutes Intravenous Every 24 hours 03/23/20 1152 03/27/20 1258             Family Communication/Anticipated D/C date and plan/Code Status   DVT prophylaxis: enoxaparin (LOVENOX) injection 40 mg Start: 03/23/20 2200     Code Status: Full Code  Family Communication: discussed with patient Disposition Plan:    Status is: Inpatient  Remains inpatient appropriate because:Unsafe d/c plan and Inpatient level of care appropriate due to severity of illness   Dispo: The patient is from: Home              Anticipated d/c is to: CIR versus LTAC              Anticipated d/c date is: 3 days              Patient currently is not medically stable to d/c.     Subjective:   Denies any shortness of breath at this time. He is sitting up in chair. Feeling better.  Objective:    Vitals:   04/14/20 0800 04/14/20 0812 04/14/20 1000 04/14/20 1118  BP:  (!) 105/57    Pulse: 84 83 100   Resp: (!) 31 (!) 21 (!) 41   Temp: 98.1 F (36.7 C)     TempSrc: Oral     SpO2: 96% 95% 91% 95%  Weight:      Height:       No data found.   Intake/Output Summary (Last 24 hours) at 04/14/2020 1140 Last data filed at 04/14/2020 0809 Gross per 24 hour  Intake 718 ml  Output 1300 ml  Net -582 ml   Filed Weights   04/11/20 0402 04/12/20 0425 04/13/20 0339  Weight: 60.4 kg 60.1 kg 60 kg    Exam:  General exam: Alert, awake, oriented x 3 Respiratory system: Clear to auscultation. Respiratory effort normal. Cardiovascular system:RRR. No murmurs, rubs, gallops. Gastrointestinal  system: Abdomen is nondistended, soft and nontender. No organomegaly or masses felt. Normal bowel sounds heard. Central nervous system: Alert and oriented. No focal neurological deficits. Extremities: No C/C/E, +pedal pulses Skin: No rashes, lesions or ulcers Psychiatry: Judgement and insight appear normal. Mood & affect appropriate.     Data Reviewed:   I have personally reviewed following labs and imaging studies:  Labs: Labs show the following:   Basic Metabolic Panel: Recent Labs  Lab 04/08/20  1042 04/09/20 0326 04/10/20 0408 04/11/20 0410 04/12/20 0232 04/13/20 0328  NA 136 138 137 138 135  --   K 4.1 4.0 4.0 4.0 4.4  --   CL 96* 96* 95* 96* 95*  --   CO2 31 33* 34* 34* 32  --   GLUCOSE 125* 105* 149* 108* 150*  --   BUN 26* 26* 32* 28* 26*  --   CREATININE 0.67 0.58* 0.61 0.55* 0.63 0.57*  CALCIUM 9.2 9.2 9.0 9.2 8.7*  --   MG 1.9  --  2.0  --   --   --   PHOS 3.8  --   --   --   --   --    GFR Estimated Creatinine Clearance: 79.2 mL/min (A) (by C-G formula based on SCr of 0.57 mg/dL (L)). Liver Function Tests: Recent Labs  Lab 04/08/20 1042  ALBUMIN 3.0*   No results for input(s): LIPASE, AMYLASE in the last 168 hours. No results for input(s): AMMONIA in the last 168 hours. Coagulation profile No results for input(s): INR, PROTIME in the last 168 hours.  CBC: Recent Labs  Lab 04/08/20 1042 04/09/20 0326 04/10/20 0408 04/12/20 0232  WBC 9.0 7.4  --   --   NEUTROABS 5.1 3.8  --   --   HGB 10.3* 9.9* 9.7* 9.0*  HCT 32.8* 31.9* 30.8* 29.2*  MCV 92.1 93.8  --   --   PLT 242 256  --   --    Cardiac Enzymes: No results for input(s): CKTOTAL, CKMB, CKMBINDEX, TROPONINI in the last 168 hours. BNP (last 3 results) No results for input(s): PROBNP in the last 8760 hours. CBG: Recent Labs  Lab 04/13/20 0759 04/13/20 1155 04/13/20 1622 04/13/20 2150 04/14/20 0737  GLUCAP 135* 155* 133* 91 143*   D-Dimer: No results for input(s): DDIMER in the last  72 hours. Hgb A1c: No results for input(s): HGBA1C in the last 72 hours. Lipid Profile: No results for input(s): CHOL, HDL, LDLCALC, TRIG, CHOLHDL, LDLDIRECT in the last 72 hours. Thyroid function studies: No results for input(s): TSH, T4TOTAL, T3FREE, THYROIDAB in the last 72 hours.  Invalid input(s): FREET3 Anemia work up: No results for input(s): VITAMINB12, FOLATE, FERRITIN, TIBC, IRON, RETICCTPCT in the last 72 hours. Sepsis Labs: Recent Labs  Lab 04/08/20 1042 04/09/20 0326  WBC 9.0 7.4    Microbiology No results found for this or any previous visit (from the past 240 hour(s)).  Procedures and diagnostic studies:  No results found.     LOS: 22 days   [employer] on [email or web address]. If 7PM-7AM, please contact night-coverage at [email or web address]     04/14/2020, 11:40 AM

## 2020-04-14 NOTE — Telephone Encounter (Signed)
Order placed to transition patient to palliative care.  Once he is transitioned to palliative care we can place another order to get patient established with DME. Will keep order open to follow up on this.

## 2020-04-14 NOTE — TOC Progression Note (Signed)
Transition of Care [facility]) - Progression Note    Patient Details  Name: DAION GINSBERG MRN: [id number] Date of Birth: [birth year]  [facility] CM/SW Contact  Joaquin Courts, RN Phone Number: 04/14/2020, 1:32 PM  Clinical Narrative:    CM spoke with patient at bedside re: dc planning.  CM shared with patient therapy recommendations for CIR and barrier of high oxygen requirements during therapy that CIR is not able to provide.  CM also spoke with patient about LTAC, patient has previously been in this setting and is familiar with it.  CM shares that [facility] reports they cannot offer bed, but [facility] has a bed available for patient pending insurance auth.  Patient understands the O2 requirement barriers and agrees to pursue LTAC ([facility]).  MD updated on this conversation and in in agreement with LTAC placement.  [facility] rep Raquel Sarna contacted and will initiate insurance authorization, of note [employer] may take 24-48 hours for auth.  TOC will continue to follow.   Expected Discharge Plan: Long Term Acute Care (LTAC) Barriers to Discharge: Insurance Authorization  Expected Discharge Plan and Services Expected Discharge Plan: [place] (LTAC)   Discharge Planning Services: CM Consult   Living arrangements for the past 2 months: Mobile Home                                       Social Determinants of Health (SDOH) Interventions    Readmission Risk Interventions Readmission Risk Prevention Plan 07/22/2019  Transportation Screening Complete  Medication Review Press photographer) Complete  Some recent data might be hidden

## 2020-04-15 DIAGNOSIS — Z931 Gastrostomy status: Secondary | ICD-10-CM

## 2020-04-15 DIAGNOSIS — J9621 Acute and chronic respiratory failure with hypoxia: Secondary | ICD-10-CM | POA: Diagnosis not present

## 2020-04-15 DIAGNOSIS — Z930 Tracheostomy status: Secondary | ICD-10-CM | POA: Diagnosis not present

## 2020-04-15 LAB — CBC
HCT: 31 % — ABNORMAL LOW (ref 39.0–52.0)
Hemoglobin: 9.4 g/dL — ABNORMAL LOW (ref 13.0–17.0)
MCH: 28.2 pg (ref 26.0–34.0)
MCHC: 30.3 g/dL (ref 30.0–36.0)
MCV: 93.1 fL (ref 80.0–100.0)
Platelets: 237 10*3/uL (ref 150–400)
RBC: 3.33 MIL/uL — ABNORMAL LOW (ref 4.22–5.81)
RDW: 14.3 % (ref 11.5–15.5)
WBC: 7.7 10*3/uL (ref 4.0–10.5)
nRBC: 0 % (ref 0.0–0.2)

## 2020-04-15 LAB — BASIC METABOLIC PANEL
Anion gap: 10 (ref 5–15)
BUN: 27 mg/dL — ABNORMAL HIGH (ref 8–23)
CO2: 33 mmol/L — ABNORMAL HIGH (ref 22–32)
Calcium: 9.2 mg/dL (ref 8.9–10.3)
Chloride: 95 mmol/L — ABNORMAL LOW (ref 98–111)
Creatinine, Ser: 0.53 mg/dL — ABNORMAL LOW (ref 0.61–1.24)
GFR, Estimated: >60 mL/min (ref >60)
Glucose, Bld: 130 mg/dL — ABNORMAL HIGH (ref 70–99)
Potassium: 4.3 mmol/L (ref 3.5–5.1)
Sodium: 138 mmol/L (ref 135–145)

## 2020-04-15 LAB — GLUCOSE, CAPILLARY
Glucose-Capillary: 141 mg/dL — ABNORMAL HIGH (ref 70–99)
Glucose-Capillary: 104 mg/dL — ABNORMAL HIGH (ref 70–99)
Glucose-Capillary: 127 mg/dL — ABNORMAL HIGH (ref 70–99)
Glucose-Capillary: 84 mg/dL (ref 70–99)

## 2020-04-15 NOTE — Progress Notes (Signed)
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 S/p chest tube and Tracheostomy Exchange 12/13. Patient on trach collar/vent PRN.   OT comments  Patient progressing and showed improved bed mobility with decreased assistance and tolerated EOB sitting x ~7 min but with desaturation to 66% on new 35% trach O2 setting per RT and need to return to supine to recover with PLB to 90%. Patient limited by O2 requirements and debility along with deficits noted below. Pt continues to demonstrate good rehab potential and would benefit from continued skilled OT to increase safety and independence with ADLs and functional transfers to allow pt to return home safely and reduce caregiver burden and fall risk.   Follow Up Recommendations  CIR;LTACH    Equipment Recommendations  3 in 1 bedside commode    Recommendations for Other Services      Precautions / Restrictions Precautions Precautions: Fall Precaution Comments: trach, PEG tube, has a PMV (do not have it in place when mobilizing), needs his sneakers brought in. Per [name], NP, ok to increase to 50% TC Required Braces or Orthoses: Other Brace Splint/Cast: prevalon boots Restrictions Weight Bearing Restrictions: No       Mobility Bed Mobility Overal bed mobility: Needs Assistance Bed Mobility: Supine to Sit     Supine to sit: HOB elevated;Supervision Sit to supine: HOB elevated;Min guard   General bed mobility comments: Min guard to raise LEs onto bed.  Transfers Overall transfer level:  (deferred due to SOB)                     Balance   Sitting-balance support: Single extremity supported;Feet supported;No upper extremity supported Sitting balance-Leahy Scale: Good Sitting balance - Comments: Able to release BUE from bed to engage in grooming.   Standing balance support:  (deferred)                               ADL either performed or assessed with clinical judgement   ADL   Eating/Feeding: NPO (PEG)   Grooming: Wash/dry face;Set up;Sitting Grooming Details (indicate cue type and reason): Pt able to wash face with  setup while EOB. Pt required Mod As for UB bathing at EOB due to fatigue. Upper Body Bathing: Sitting;Moderate assistance       Upper Body Dressing : Bed level;Minimal assistance                   Functional mobility during ADLs: Min guard (Bed mobility only.) General ADL Comments: Deferred transfers due to desat with EOB grooming tasks to 66% on 35% (newly set by RT prior to OT) RN notified.     Vision       Perception     Praxis      Cognition Arousal/Alertness: Awake/alert Behavior During Therapy: WFL for tasks assessed/performed Overall Cognitive Status: Within Functional Limits for tasks assessed  General Comments: Able to express when SOB and requiring rest breaks.        Exercises     Shoulder Instructions       General Comments Desat to 66% EOB. Returned to supine, and coached in PLB with slow recovery to 90%. RN notified.    Pertinent Vitals/ Pain       Pain Assessment: No/denies pain Faces Pain Scale: No hurt  Home Living                                          Prior Functioning/Environment              Frequency  Min 2X/week        Progress Toward Goals  OT Goals(current goals can now be found in the care plan section)  Progress towards OT goals: Progressing toward goals  Acute Rehab OT Goals Patient Stated Goal: Regain as much IND as possible OT  Goal Formulation: With patient Time For Goal Achievement: 04/22/20 Potential to Achieve Goals: Good  Plan Discharge plan remains appropriate (CIR vs LTAC based on O2 requirements at d/c)    Co-evaluation                 AM-PAC OT "6 Clicks" Daily Activity     Outcome Measure   Help from another person eating meals?: Total Help from another person taking care of personal grooming?: A Little Help from another person toileting, which includes using toliet, bedpan, or urinal?: A Lot Help from another person bathing (including washing, rinsing, drying)?: A Lot Help from another person to put on and taking off regular upper body clothing?: A Little Help from another person to put on and taking off regular lower body clothing?: Total 6 Click Score: 12    End of Session Equipment Utilized During Treatment: Oxygen  OT Visit Diagnosis: Other abnormalities of gait and mobility (R26.89);Muscle weakness (generalized) (M62.81)   Activity Tolerance Patient limited by fatigue;Treatment limited secondary to medical complications (Comment)   Patient Left in bed;with call bell/phone within reach;with bed alarm set   Nurse Communication Mobility status (O2 sats and mobility)        Time: 5038-8828 OT Time Calculation (min): 40 min  Charges: OT General Charges $OT Visit: 1 Visit OT Treatments $Self Care/Home Management : 23-37 mins $Therapeutic Activity: 8-22 mins  Anderson Malta, OT Acute Rehab Services Office: [phone] 04/15/2020   Julien Girt 04/15/2020, 9:15 AM

## 2020-04-15 NOTE — Progress Notes (Signed)
Progress Note    Steve [name]  WRU:[id number] DOB: [birth year]  DOA: 03/23/2020 PCP: [name], PA-C      Brief Narrative:    Medical records reviewed and are as summarized below:  Steve [name] is a 65 y.o. 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      Assessment/Plan:   Active Problems:   Acute on chronic respiratory failure ([name])   Status post tracheostomy ([place])   On mechanically assisted ventilation ([name])   ARDS (adult respiratory distress syndrome) ([name])   Pneumothorax on right   Tracheostomy care ([place])   Debility   Anxiety   Nutrition Problem: Moderate Malnutrition Etiology: chronic illness (lymphoma)  Signs/Symptoms: moderate fat depletion,moderate muscle depletion,severe muscle depletion   Body mass index is 22.27 kg/m.      Acute on chronic hypoxic respiratory failure in the setting of Covid related pulmonary fibrosis and severe deconditioning/tracheostomy dependence, on home vent prior to admission, presented with tracheostomy malpositioning and pneumothorax - Patient was admitted to the critical care team and transferred to the hospitalist team 12/29.  -Bronchoscopy on 03/23/2020 and 03/25/2020 for dislodged tracheostomy tube and repositioning of tracheostomy respectively - Patient has been changed to a cuffless size 6 , off vent, 35% FiO2 at a liter per minute, case discussed with critical care who agreed to proceed with Passy-Muir valve evaluation, speech consult ordered - Per PCCM greatest challenge at this time is profound deconditioning superimposed on chronic underlying fibrotic lung changes, indicate he will always be oxygen dependent however does not feel patient will require trach indefinitely and  currently needing aggressive rehab and at this point oxygen requirements noted to be a barrier. Will need trach clinic follow-up with [name] - Supplemental oxygen has to be less than 35% to be a CIR candidate - may need LTAC if this isn't possible.  He is on FiO2 of 35% at 8 L/min. - Received first Covid vaccine and due for second Covid vaccine after 04/23/2019  Pneumothorax - treated with right chest tube initially, Resolved.  Per PCCM.  Stenotrophomonas in sputum - Felt likely secondary to colonization.  Antibiotics have been discontinued.  Follow.  Well-controlled diabetes mellitus type 2 - Hemoglobin A1c 5.7 (03/23/2020).   Continue tube feeds.  Sliding scale insulin.   Anxiety - Continue Ativan as needed.    History of lymphoma - Treated in the spring 2021 prior to Covid.  Will need outpatient follow-up with oncology.  Severe deconditioning/debility - PT recommending CIR - O2 requirements may be a limiting factor.  If CIR not an option per - PCCM LTAC may be appropriate.  Continue PT/ OT.  TOC consulted for possible LTAC in case patient gets denied for CIR due to O2 requirements.   Dysphagia - Patient noted to desat with modified barium swallow however felt secondary to deconditioning and not related to aspiration.  Continue tube feeds per PEG tube.  Gastro esophageal reflux disease - Continue PPI.      Diet Order            Diet NPO time specified  Diet effective now                    Consultants:  PCCM admission    Procedures:  Tracheostomy present on prior to admission  Right chest tube placed on 03/23/2020 for pneumothorax  Bronchoscopy on 03/23/2020 and 03/25/2020 for dislodged tracheostomy tube and repositioning of tracheostomy respectively    Medications:   . chlorhexidine  15 mL Mouth Rinse BID  . Chlorhexidine Gluconate Cloth  6 each Topical Daily  . enoxaparin (LOVENOX) injection  40 mg Subcutaneous Q24H  . feeding supplement  (PROSource TF)  45 mL Per Tube TID  . free water  100 mL Per Tube Q4H  . insulin aspart  0-15 Units Subcutaneous TID WC  . insulin aspart  0-5 Units Subcutaneous QHS  . mouth rinse  15 mL Mouth Rinse q12n4p  . pantoprazole sodium  40 mg Per Tube Daily  . sertraline  50 mg Per Tube Daily  . sodium chloride flush  10-40 mL Intracatheter Q12H   Continuous Infusions: . feeding supplement (OSMOLITE 1.2 CAL) 1,000 mL (04/15/20 0433)     Anti-infectives (From admission, onward)   Start     Dose/Rate Route Frequency Ordered Stop   04/04/20 1300  levofloxacin (LEVAQUIN) tablet 750 mg  Status:  Discontinued        750 mg Per Tube Daily 04/04/20 1149 04/06/20 0934   03/23/20 1200  cefTRIAXone (ROCEPHIN) 2 g in sodium chloride 0.9 % 100 mL IVPB        2 g 200 mL/hr over 30 Minutes Intravenous Every 24 hours 03/23/20 1152 03/27/20 1258             Family Communication/Anticipated D/C date and plan/Code Status   DVT prophylaxis: enoxaparin (LOVENOX) injection 40 mg Start: 03/23/20 2200     Code Status: Full Code  Family Communication: Wife updated over the phone with permission, unfortunately wife started have Covid symptoms going to be tested tomorrow Disposition Plan:    Status is: Inpatient  Remains inpatient appropriate because:Unsafe d/c plan and Inpatient level of care appropriate due to severity of illness   Dispo: The patient is from: Home              Anticipated d/c is to: CIR versus LTAC              Anticipated d/c date is: Awaiting insurance authorization for LTAC, possible tomorrow                  Subjective:   Denies any shortness of breath at this time. He is able to talk through the trach collar without Passy-Muir valve, he is tolerating tube feeds Is improving, case discussed with pulmonology who agreed with speech eval for Passy-Muir valve  Objective:    Vitals:   04/15/20 0420 04/15/20 0828 04/15/20 0831 04/15/20 1125  BP: 93/65     Pulse: 81    87  Resp: 18   17  Temp: 98.1 F (36.7 C)     TempSrc: Oral     SpO2: 98% 97% 97% 93%  Weight: 60.7 kg     Height:       No data found.   Intake/Output Summary (Last 24 hours) at 04/15/2020 1206 Last data filed at 04/15/2020 0900 Gross per 24 hour  Intake 834.16 ml  Output 1125 ml  Net -290.84 ml   Filed Weights   04/12/20 0425 04/13/20 0339 04/15/20 0420  Weight: 60.1 kg 60 kg 60.7 kg    Exam:  General exam: Alert, awake, oriented x 3, positive trach, positive PEG Respiratory system: Diminished at bases,. Respiratory effort normal. Cardiovascular system:RRR. No murmurs, rubs,  gallops. Gastrointestinal system: Abdomen is nondistended, soft and nontender.  Normal bowel sounds heard. Central nervous system: Alert and oriented. No focal neurological deficits. Extremities: No C/C/E, +pedal pulses Skin: No rashes, lesions or ulcers Psychiatry: Judgement and insight appear normal. Mood & affect appropriate.     Data Reviewed:   I have personally reviewed following labs and imaging studies:  Labs: Labs show the following:   Basic Metabolic Panel: Recent Labs  Lab 04/09/20 0326 04/10/20 0408 04/11/20 0410 04/12/20 0232 04/13/20 0328 04/15/20 0400  NA 138 137 138 135  --  138  K 4.0 4.0 4.0 4.4  --  4.3  CL 96* 95* 96* 95*  --  95*  CO2 33* 34* 34* 32  --  33*  GLUCOSE 105* 149* 108* 150*  --  130*  BUN 26* 32* 28* 26*  --  27*  CREATININE 0.58* 0.61 0.55* 0.63 0.57* 0.53*  CALCIUM 9.2 9.0 9.2 8.7*  --  9.2  MG  --  2.0  --   --   --   --    GFR Estimated Creatinine Clearance: 80.1 mL/min (A) (by C-G formula based on SCr of 0.53 mg/dL (L)). Liver Function Tests: No results for input(s): AST, ALT, ALKPHOS, BILITOT, PROT, ALBUMIN in the last 168 hours. No results for input(s): LIPASE, AMYLASE in the last 168 hours. No results for input(s): AMMONIA in the last 168 hours. Coagulation profile No results for input(s): INR, PROTIME in the last 168  hours.  CBC: Recent Labs  Lab 04/09/20 0326 04/10/20 0408 04/12/20 0232 04/15/20 0400  WBC 7.4  --   --  7.7  NEUTROABS 3.8  --   --   --   HGB 9.9* 9.7* 9.0* 9.4*  HCT 31.9* 30.8* 29.2* 31.0*  MCV 93.8  --   --  93.1  PLT 256  --   --  237   Cardiac Enzymes: No results for input(s): CKTOTAL, CKMB, CKMBINDEX, TROPONINI in the last 168 hours. BNP (last 3 results) No results for input(s): PROBNP in the last 8760 hours. CBG: Recent Labs  Lab 04/14/20 1147 04/14/20 1633 04/14/20 2115 04/15/20 0728 04/15/20 1127  GLUCAP 124* 130* 129* 141* 104*   D-Dimer: No results for input(s): DDIMER in the last 72 hours. Hgb A1c: No results for input(s): HGBA1C in the last 72 hours. Lipid Profile: No results for input(s): CHOL, HDL, LDLCALC, TRIG, CHOLHDL, LDLDIRECT in the last 72 hours. Thyroid function studies: No results for input(s): TSH, T4TOTAL, T3FREE, THYROIDAB in the last 72 hours.  Invalid input(s): FREET3 Anemia work up: No results for input(s): VITAMINB12, FOLATE, FERRITIN, TIBC, IRON, RETICCTPCT in the last 72 hours. Sepsis Labs: Recent Labs  Lab 04/09/20 0326 04/15/20 0400  WBC 7.4 7.7    Microbiology No results found for this or any previous visit (from the past 240 hour(s)).  Procedures and diagnostic studies:  No results found.     LOS: 23 days   [facility]   Pager on [email or web address]. If 7PM-7AM, please contact night-coverage at [email or web address]     04/15/2020, 12:06 PM

## 2020-04-15 NOTE — Plan of Care (Signed)
Pt. Is improving

## 2020-04-16 DIAGNOSIS — Z931 Gastrostomy status: Secondary | ICD-10-CM | POA: Diagnosis not present

## 2020-04-16 DIAGNOSIS — J9621 Acute and chronic respiratory failure with hypoxia: Secondary | ICD-10-CM | POA: Diagnosis not present

## 2020-04-16 DIAGNOSIS — Z930 Tracheostomy status: Secondary | ICD-10-CM | POA: Diagnosis not present

## 2020-04-16 LAB — GLUCOSE, CAPILLARY
Glucose-Capillary: 104 mg/dL — ABNORMAL HIGH (ref 70–99)
Glucose-Capillary: 92 mg/dL (ref 70–99)
Glucose-Capillary: 91 mg/dL (ref 70–99)
Glucose-Capillary: 113 mg/dL — ABNORMAL HIGH (ref 70–99)

## 2020-04-16 NOTE — Progress Notes (Signed)
  Speech Language Pathology Treatment: Dysphagia;Passy Muir Speaking valve  Patient Details Name: Steve [name] MRN: [id number] DOB: [birth year] Today's Date: 04/16/2020 Time: 9563-8756 SLP Time Calculation (min) (ACUTE ONLY): 57 min  Assessment / Plan / Recommendation Clinical Impression  Great session today.  Steve [name] is doing very well from a communication/swallowing perspective. Supplied with new PMV, as prior valve was lost during transfer from ICU.  Pt was instructed how to place and remove PMV, which he was able to do without a mirror over multiple trials independently.  He continues to have airleak around base of trach due to large stoma.  When PMV is in place, he needs to manually cover the base of his stoma with fingers to occlude it and prevent air loss.  D/W RN and RT; RN arrived with mepilex foam to cover base.  Pt is able to manipulate fingers until he finds good coverage and better voice.    He was provided with applesauce and water, which he consumed happily and with no overt s/s of difficulty or aspiration.    Pt's Sp02 hovered between 82-94% during duration of session, with and without valve in place.  When he felt SOB, he was able to remove valve independently and replace it when he recovered.  - Pt may have pudding/applesauce and thin liquids from floor stock.  He may do so independently without nursing supervision.  - Pt may use PMV independently with intermittent supervision.  - SLP will continue to follow for diet advancement/safety.       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 S/p chest tube and Tracheostomy Exchange 12/13.  PEG.  No record of prior swallow or PMV evaluations. Pt  originally had trach placed in April 2021 after admission for severe acute respiratory failure with hypoxemia in the setting of receiving chemotherapy for Hodgkin's lymphoma.  Pt now on trach collar - 10 L 40% and uses home vent at night.  CXR Progressive pulmonary hypoinflation.     Stable pulmonary infiltrates related to underlying interstitial lung  disease.     Stable right paratracheal soft tissue thickening related to  underlying known mediastinal adenopathy.Steve [name] changed yesterday to [place] #6 XL.  MBS indicated to assess for po readiness.  Pt with h/o dysphagia and had Gtube placed 09/09/2019      SLP Plan  Continue with current plan of care       Recommendations  Diet recommendations: Dysphagia 1 (puree);Thin liquid Liquids provided via: Cup;Straw Medication Administration: Via alternative means Supervision: Patient able to self feed Compensations: Multiple dry swallows after each bite/sip Postural Changes and/or Swallow Maneuvers: Seated upright 90 degrees      Patient may use Passy-Muir Speech Valve: Intermittently with supervision PMSV Supervision: Intermittent         Oral Care Recommendations: Oral care BID Follow up Recommendations: Inpatient Rehab SLP Visit Diagnosis: Dysphagia, pharyngeal phase (R13.13);Aphonia (R49.1) Plan: Continue with current plan of care       Steve [name]. Steve [name], [place] CCC/SLP Acute Rehabilitation Services Office number [phone] Pager [phone]   Steve [name] 04/16/2020, 3:52 PM

## 2020-04-16 NOTE — TOC Progression Note (Signed)
Transition of Care [facility]) - Progression Note    Patient Details  Name: Steve [name] MRN: [id number] Date of Birth: 04-Feb-1956  Transition of [facility] CM/SW Contact  Arriah Wadle, Juliann Pulse, RN Phone Number: 04/16/2020, 1:18 PM  Clinical Narrative: LTACH [facility] rep Raquel Sarna awaiting medical review by med director of insurance.     Expected Discharge Plan: Long Term Acute Care (LTAC) Barriers to Discharge: Insurance Authorization  Expected Discharge Plan and Services Expected Discharge Plan: [place] (LTAC)   Discharge Planning Services: CM Consult   Living arrangements for the past 2 months: Mobile Home                                       Social Determinants of Health (SDOH) Interventions    Readmission Risk Interventions Readmission Risk Prevention Plan 07/22/2019  Transportation Screening Complete  Medication Review Press photographer) Complete  Some recent data might be hidden

## 2020-04-16 NOTE — Progress Notes (Addendum)
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 S/p chest tube and Tracheostomy Exchange 12/13. Patient on trach collar/vent PRN.    PT Comments    Focused on WC self propulsion today. Pt used BUE/LEs to self propel 200' with ~5 rest breaks 2* 3/4 dyspnea. Pt on 10L 40% O2 trach collar during mobility, SaO2 80-87%, HR 113 max. Instructed pt in BLE strengthening and stretching exercises, noted pt L ankle tightness, Passive dorsiflexion is ~ -15*. Pt puts forth good effort and is very motivated. He would be a good CIR candidate.  Follow Up Recommendations  CIR     Equipment Recommendations  Wheelchair (measurements PT);Wheelchair cushion (measurements PT)    Recommendations for Other Services       Precautions / Restrictions Precautions Precautions: Fall Precaution Comments: trach, PEG tube, has a PMV (do not have it in place when mobilizing), needs his sneakers brought in. Per [name], NP, ok to increase to 50% TC Required Braces or Orthoses: Other Brace Splint/Cast: prevalon boots Restrictions Weight Bearing Restrictions: No    Mobility  Bed Mobility Overal bed mobility: Needs Assistance Bed Mobility: Supine to Sit     Supine to sit: HOB elevated;Supervision Sit to supine: Min guard   General bed mobility comments: Min guard to raise LEs onto bed.  Transfers Overall transfer level: Needs assistance Equipment used: None Transfers: Sit to/from [employer]     Squat pivot transfers: Mod assist;+2 physical assistance;+2 safety/equipment     General  transfer comment: squat pivot transfer with rest break 1/2 way for bed to WC; then WC to bed with no rest break. May benefit from trial of sliding board next session  Ambulation/Gait                 Theme park manager mobility: Yes Wheelchair propulsion: Both upper extremities;Both lower extermities Wheelchair parts: Supervision/cueing Distance: 200 Wheelchair Assistance Details (indicate cue type and reason): pt took ~5 rest breaks 2* 3/4 dyspnea, SaO2 80-87% on 10L 40% O2 trach collar; pt able to engage/disengage WC brakes independently  Modified Rankin (Stroke Patients Only)       Balance Overall balance assessment: Needs assistance Sitting-balance support: Feet supported;Single extremity supported Sitting balance-Leahy Scale: Good     Standing balance support: Bilateral upper extremity supported Standing balance-Leahy Scale: Poor Standing balance comment: reliant on support                            Cognition Arousal/Alertness: Awake/alert Behavior During Therapy: WFL for tasks assessed/performed Overall Cognitive Status: Within Functional Limits for tasks assessed                                 General Comments: Able to express when SOB and requiring rest breaks.      Exercises General Exercises - Lower Extremity Ankle Circles/Pumps: AROM;AAROM;Both;10 reps;Supine Quad Sets: AROM;Both;5 reps;Supine Heel Slides: AROM;Both;10 reps;Supine Hip ABduction/ADduction: AROM;Both;10 reps;Supine Other Exercises  Other Exercises: L hamstring and L gastroc stretch with gait belt x 3 LLE supine    General Comments        Pertinent Vitals/Pain Pain Assessment: No/denies pain    Home Living                      Prior Function            PT Goals (current goals can now be found in the care plan section) Acute Rehab PT Goals Patient Stated Goal: Regain as much IND as  possible PT Goal Formulation: With patient Time For Goal Achievement: 04/21/20 Potential to Achieve Goals: Fair Progress towards PT goals: Progressing toward goals    Frequency    Min 3X/week      PT Plan Current plan remains appropriate    Co-evaluation              AM-PAC PT "6 Clicks" Mobility   Outcome Measure  Help needed turning from your back to your side while in a flat bed without using bedrails?: A Little Help needed moving from lying on your back to sitting on the side of a flat bed without using bedrails?: A Little Help needed moving to and from a bed to a chair (including a wheelchair)?: A Lot Help needed standing up from a chair using your arms (e.g., wheelchair or bedside chair)?: A Lot Help needed to walk in hospital room?: A Lot Help needed climbing 3-5 steps with a railing? : Total 6 Click Score: 13    End of Session Equipment Utilized During Treatment: Gait belt;Oxygen Activity Tolerance: Patient tolerated treatment well Patient left: with call bell/phone within reach;in bed;with bed alarm set Nurse Communication: Mobility status PT Visit Diagnosis: Muscle weakness (generalized) (M62.81)     Time: 4287-6811 PT Time Calculation (min) (ACUTE ONLY): 49 min  Charges:  $Therapeutic Exercise: 8-22 mins $Therapeutic Activity: 8-22 mins $Wheel Chair Management: 8-22 mins                     Blondell Reveal Kistler PT 04/16/2020  Acute Rehabilitation Services Pager [phone] Office [phone]

## 2020-04-16 NOTE — Telephone Encounter (Signed)
Order was placed and sent to [facility]

## 2020-04-16 NOTE — Progress Notes (Addendum)
PROGRESS NOTE    DONIS PINDER  WUX:[id number] DOB: 04-05-1956 DOA: 03/23/2020 PCP: [name], PA-C    Chief Complaint  Patient presents with  . Cough  . Tracheostomy Tube Change    Brief Narrative:  Steve [name] is a 65 y.o. 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  Subjective:  Denies pain, tolerating tube feeds, on trach collar,  He had speech eval this morning, per speech "Pt's Sp02 hovered between 82-94% during duration of session, with and without valve in place.  When he felt SOB, he was able to remove valve independently and replace it when he recovered."   Assessment & Plan:   Active Problems:   Acute on chronic respiratory failure ([name])   Status post tracheostomy ([place])   On mechanically assisted ventilation ([name])   ARDS (adult respiratory distress syndrome) ([name])   Pneumothorax on right   Tracheostomy care ([place])   Debility   Anxiety  Acute on chronic hypoxic respiratory failure in the setting of Covidrelated pulmonary fibrosis and severedeconditioning/tracheostomy dependence, on home vent prior to admission, presented with tracheostomy malpositioning and pneumothorax -Patient was admitted to the critical care team and transferred to the hospitalist team 12/29. -Bronchoscopy on 03/23/2020 and 03/25/2020 for dislodged tracheostomy tube and repositioning of tracheostomy respectively -Patient has been changed to a cuffless size 6 , off vent, 35% FiO2 at a liter per minute -Passy-Muir valve lost during transition from ICU to the floor, repeat speech study today, tolerated Passy-Muir valve, speech also okayed with dysphagia 1 diet/thin liquid on 1/ 6,  -PCCM hoping for decannulation once patient decondition and nutrition  status improves, Will need trach clinic follow-up with [name] -Looking into options of CIR versus LTAC,  -Per CiR patient will not qualify for CIR unless with improved tolerance "at most 35% trach collar, no more than 10L with mobility, and maintaining O2 sats >80%" -Case manager working on LTAC placement as well -He is medically ready to discharge once accepted by CIR or LTAC -Received first Covid vaccine and due for second Covid vaccine after 04/23/2019  Possible left lobar pneumonia , presents on admission Initial sputum culture grew"FEW DIPHTHEROIDS(CORYNEBACTERIUM SPECIES" Received Rocephin x5 days  Pneumothorax -treated with right chest tube initially, Resolved. Per PCCM.  Stenotrophomonas in trach aspirate from 12/22 -Felt likely secondary to colonization. received levaquinx3 days, abx  have been discontinued. Follow.  Well-controlled diabetes mellitus type 2, previously insulin-dependent prior to discharge -Hemoglobin A1c 5.7 (03/23/2020).  Continue tube feeds. Sliding scale insulin, has not needed any SSI coverage in the hospital in the last few days  Anxiety -Continue Ativan as needed.   History of lymphoma -Treated in the spring 2021 prior to Covid. Will need outpatient follow-up with oncology.  Severe deconditioning/debility Continue PT OT, CIR versus LTAC placement  Dysphagia thought secondary to deconditioning, nutrition per PEG tube, started dysphagia diet 1 thin liquid on 1/ 6, speech continue following  GERD continue PPI  DVT prophylaxis: enoxaparin (LOVENOX) injection 40 mg Start: 03/23/20 2200   Code Status: Full Family Communication: Patient wife over the phone on 1/5 with his permission Disposition:   Status is: Inpatient  Dispo: The patient is from: Home              Anticipated d/c is to: CIR  versus LTAC              Anticipated d/c date is: Medically stable to discharge when bed is available               Consultants:    Critical care  IR  Procedures:   Tracheostomy present on prior to admission  Right chest tube placed on 03/23/2020 for pneumothorax  Bronchoscopy on 03/23/2020 and 03/25/2020 for dislodged tracheostomy tube and repositioning of tracheostomy respectively    Antimicrobials:   Levaquin and Rocephin  Anti-infectives (From admission, onward)   Start     Dose/Rate Route Frequency Ordered Stop   04/04/20 1300  levofloxacin (LEVAQUIN) tablet 750 mg  Status:  Discontinued        750 mg Per Tube Daily 04/04/20 1149 04/06/20 0934   03/23/20 1200  cefTRIAXone (ROCEPHIN) 2 g in sodium chloride 0.9 % 100 mL IVPB        2 g 200 mL/hr over 30 Minutes Intravenous Every 24 hours 03/23/20 1152 03/27/20 1258         Objective: Vitals:   04/16/20 0331 04/16/20 0641 04/16/20 0748 04/16/20 1412  BP:  104/71  104/71  Pulse: 89 70 78 95  Resp: 20 (!) 22 (!) 23 18  Temp:  (!) 97.4 F (36.3 C)  98 F (36.7 C)  TempSrc:  Oral  Oral  SpO2: 96% 100% 99% 92%  Weight:      Height:        Intake/Output Summary (Last 24 hours) at 04/16/2020 1718 Last data filed at 04/16/2020 1646 Gross per 24 hour  Intake 1745.17 ml  Output 2200 ml  Net -454.83 ml   Filed Weights   04/12/20 0425 04/13/20 0339 04/15/20 0420  Weight: 60.1 kg 60 kg 60.7 kg    Examination:  General exam: calm, NAD, on trach collar, positive PEG Respiratory system: Diminished, no wheezing. Respiratory effort normal. Cardiovascular system: S1 & S2 heard, RRR. No pedal edema. Gastrointestinal system: Abdomen is nondistended, soft and nontender.  Normal bowel sounds heard.  Positive feeding tube Central nervous system: Alert and oriented. No focal neurological deficits. Extremities: Generalized weakness, moving all extremities Skin: No rashes, lesions or ulcers Psychiatry: Judgement and insight appear normal. Mood & affect appropriate.     Data Reviewed: I have personally reviewed following labs and imaging  studies  CBC: Recent Labs  Lab 04/10/20 0408 04/12/20 0232 04/15/20 0400  WBC  --   --  7.7  HGB 9.7* 9.0* 9.4*  HCT 30.8* 29.2* 31.0*  MCV  --   --  93.1  PLT  --   --  412    Basic Metabolic Panel: Recent Labs  Lab 04/10/20 0408 04/11/20 0410 04/12/20 0232 04/13/20 0328 04/15/20 0400  NA 137 138 135  --  138  K 4.0 4.0 4.4  --  4.3  CL 95* 96* 95*  --  95*  CO2 34* 34* 32  --  33*  GLUCOSE 149* 108* 150*  --  130*  BUN 32* 28* 26*  --  27*  CREATININE 0.61 0.55* 0.63 0.57* 0.53*  CALCIUM 9.0 9.2 8.7*  --  9.2  MG 2.0  --   --   --   --     GFR: Estimated Creatinine Clearance: 80.1 mL/min (A) (by C-G formula based on SCr of 0.53 mg/dL (L)).  Liver Function Tests: No results for input(s): AST, ALT, ALKPHOS, BILITOT, PROT, ALBUMIN in the last 168 hours.  CBG:  Recent Labs  Lab 04/15/20 1553 04/15/20 2234 04/16/20 0759 04/16/20 1204 04/16/20 1644  GLUCAP 127* 84 104* 92 91     No results found for this or any previous visit (from the past 240 hour(s)).       Radiology Studies: No results found.      Scheduled Meds: . chlorhexidine  15 mL Mouth Rinse BID  . Chlorhexidine Gluconate Cloth  6 each Topical Daily  . enoxaparin (LOVENOX) injection  40 mg Subcutaneous Q24H  . feeding supplement (PROSource TF)  45 mL Per Tube TID  . free water  100 mL Per Tube Q4H  . insulin aspart  0-15 Units Subcutaneous TID WC  . insulin aspart  0-5 Units Subcutaneous QHS  . mouth rinse  15 mL Mouth Rinse q12n4p  . pantoprazole sodium  40 mg Per Tube Daily  . sertraline  50 mg Per Tube Daily   Continuous Infusions: . feeding supplement (OSMOLITE 1.2 CAL) 1,000 mL (04/16/20 1433)     LOS: 24 days   Time spent: 38mins Greater than 50% of this time was spent in counseling, explanation of diagnosis, planning of further management, and coordination of care.  I have personally reviewed and interpreted on  04/16/2020 daily labs, I reviewed all nursing notes,  pharmacy notes, consultant notes,  vitals, pertinent old records  I have discussed plan of care as described above with RN , patient on 04/16/2020  Voice Recognition /Dragon dictation system was used to create this note, attempts have been made to correct errors. Please contact the author with questions and/or clarifications.   Florencia Reasons, MD PhD FACP [facility]  Available via Epic secure chat 7am-7pm for nonurgent issues Please page for urgent issues To page the attending provider between 7A-7P or the covering provider during after hours 7P-7A, please log into the web site [email or web address] and access using universal [place] password for that web site. If you do not have the password, please call the hospital operator.    04/16/2020, 5:18 PM

## 2020-04-16 NOTE — Plan of Care (Signed)
  Problem: Clinical Measurements: Goal: Will remain free from infection Outcome: Progressing Goal: Diagnostic test results will improve Outcome: Progressing   Problem: Activity: Goal: Risk for activity intolerance will decrease Outcome: Progressing   

## 2020-04-16 NOTE — Progress Notes (Signed)
Inpatient Rehabilitation Admissions Coordinator   I continue to follow pt's progress with therapies and O2 requirements.  Danne Baxter, RN, MSN Rehab Admissions Coordinator [phone] 04/16/2020 7:52 PM

## 2020-04-17 DIAGNOSIS — J80 Acute respiratory distress syndrome: Secondary | ICD-10-CM | POA: Diagnosis not present

## 2020-04-17 DIAGNOSIS — Z430 Encounter for attention to tracheostomy: Secondary | ICD-10-CM | POA: Diagnosis not present

## 2020-04-17 DIAGNOSIS — Z930 Tracheostomy status: Secondary | ICD-10-CM | POA: Diagnosis not present

## 2020-04-17 DIAGNOSIS — J9621 Acute and chronic respiratory failure with hypoxia: Secondary | ICD-10-CM | POA: Diagnosis not present

## 2020-04-17 LAB — GLUCOSE, CAPILLARY
Glucose-Capillary: 123 mg/dL — ABNORMAL HIGH (ref 70–99)
Glucose-Capillary: 107 mg/dL — ABNORMAL HIGH (ref 70–99)

## 2020-04-17 MED ORDER — INSULIN ASPART 100 UNIT/ML ~~LOC~~ SOLN: 0.0000 [IU] | Freq: Three times a day (TID) | SUBCUTANEOUS | 11 refills | Status: DC

## 2020-04-17 MED ORDER — CHLORHEXIDINE GLUCONATE CLOTH 2 % EX PADS: 6.0000 | MEDICATED_PAD | Freq: Every day | CUTANEOUS | Status: DC

## 2020-04-17 MED ORDER — PROSOURCE TF PO LIQD: 45.0000 mL | Freq: Three times a day (TID) | ORAL | Status: DC

## 2020-04-17 MED ORDER — FREE WATER: 100.0000 mL | Status: DC

## 2020-04-17 MED ORDER — CHLORHEXIDINE GLUCONATE 0.12 % MT SOLN: 15.0000 mL | Freq: Two times a day (BID) | OROMUCOSAL | 0 refills | Status: AC

## 2020-04-17 MED ORDER — ORAL CARE MOUTH RINSE: 15.0000 mL | Freq: Two times a day (BID) | OROMUCOSAL | 0 refills | Status: DC

## 2020-04-17 MED ORDER — LORAZEPAM 0.5 MG PO TABS: 0.5000 mg | ORAL_TABLET | ORAL | 0 refills | Status: DC | PRN

## 2020-04-17 MED ORDER — VITAMIN D3 10 MCG/ML PO LIQD: 400.0000 [IU] | Freq: Every day | ORAL | Status: DC

## 2020-04-17 MED ORDER — OSMOLITE 1.2 CAL PO LIQD: 1000.0000 mL | ORAL | 0 refills | Status: AC

## 2020-04-17 MED ORDER — PANTOPRAZOLE SODIUM 40 MG PO PACK: 40.0000 mg | PACK | Freq: Every day | ORAL | Status: DC

## 2020-04-17 MED ORDER — ALBUTEROL SULFATE (2.5 MG/3ML) 0.083% IN NEBU: 2.5000 mg | INHALATION_SOLUTION | RESPIRATORY_TRACT | 12 refills | Status: DC | PRN

## 2020-04-17 MED ORDER — ENOXAPARIN SODIUM 40 MG/0.4ML ~~LOC~~ SOLN: 40.0000 mg | SUBCUTANEOUS | Status: DC

## 2020-04-17 NOTE — Plan of Care (Signed)
  Problem: Clinical Measurements: Goal: Diagnostic test results will improve Outcome: Progressing Goal: Respiratory complications will improve Outcome: Progressing Goal: Cardiovascular complication will be avoided Outcome: Progressing   Problem: Activity: Goal: Risk for activity intolerance will decrease Outcome: Progressing   Problem: Nutrition: Goal: Adequate nutrition will be maintained Outcome: Progressing   

## 2020-04-17 NOTE — Discharge Summary (Signed)
Discharge Summary  Steve [name] NOB:[id number] DOB: [birth year]  PCP: Steve Pai, PA-C  Admit date: 03/23/2020 Discharge date: 04/17/2020  Time spent: 6mins, more than 50% time spent on coordination of care.   Patient is to discharge to long-term care facility  Recommendations for Outpatient Follow-up after LTAC discharge:  1. F/u with PCP  2. F/u with oncology for h/o lymphoma in 2021 3. F/u with trach clinic  Discharge Diagnoses:  Active Hospital Problems   Diagnosis Date Noted  . Debility   . Anxiety   . Tracheostomy care ([place])   . Pneumothorax on right 03/23/2020  . ARDS (adult respiratory distress syndrome) (Steve [name])   . On mechanically assisted ventilation (Steve [name])   . Status post tracheostomy (Steve [name])   . Acute on chronic respiratory failure (Steve [name]) 07/10/2019    Resolved Hospital Problems  No resolved problems to display.    Discharge Condition: stable  Diet recommendation:  Dysphagia 1 pured diet, thin liquid, multiple dry swallows after each bite /sip, seated up straight 90 degrees  Continue aspiration precaution Continue nutrition/meds per tube  Patient may use Passy-Muir valve intermittently with supervision Oral care twice daily          Filed Weights   04/13/20 0339 04/15/20 0420 04/17/20 0555  Weight: 60 kg 60.7 kg 61.3 kg    History of present illness: (Per ICU admitting MD Steve [name]) This is a pleasant 65 year old male whom is well-known to the pulmonary and critical care service from his hospitalization in the spring 2021 with Covid pneumonia.  At baseline he has lymphoma and had been receiving treatment for the same around the time of his original diagnosis with COVID.  He remained ventilator dependent during this hospital stay [facility]  He stayed there for several weeks and then went home on a home ventilator.  According to his sister who provides the history today he has been bedbound since then, though  in general his strength and his faculties have returned.  He is now able to use a cell phone to communicate with folks, his hair has grown back.  She does note that care for him at home has been somewhat Spartan and that he has not had his tracheostomy changed in months.  Per chart review there was initially some consideration for home hospice however by October 2021 when he was seen in a virtual visit with Dr. Lamonte [name] it was noted that he desired a full scope of care. According to he and his sister he developed increasing chest tightness across his chest approximately 2 days prior to admission.  He has had an increase in cough and brown mucus production.  This morning he was noted to have thick mucus and when suctioned by his home health nurse the tracheostomy dislodged so he was brought to the [facility] emergency department.  Here the tracheostomy was put back into position by the respiratory therapy and emergency physician team.  He was noted on chest imaging to have a right-sided pneumothorax.  He is currently breathing with the assistance of his home ventilator, his vital signs are stable.  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 -Bronchoscopy on 03/23/2020 and 03/25/2020 for dislodged tracheostomy tube and repositioning of tracheostomy  respectively -Patient has been changed to a cuffless size 6,off vent,35% FiO2 at a liter per minute -Passy-Muir valve lost during transition from ICU to the floor, repeat speech study 1/6, tolerated Passy-Muir valve, speech also okayed with dysphagia 1 diet/thin liquid on 1/ 6,  -PCCM hoping for decannulation once patient decondition and nutrition status improves, Will need trach clinic follow-up with Steve [name] --Per [name] patient will not qualify for CIR unless with improved tolerance "at most 35% trach collar, no more than 10L with mobility, and maintaining O2 sats >80%" -d/c to LTAC  -Received first Covid vaccine and due for second Covid vaccine after 04/23/2019  Possible left lobar pneumonia , presents on admission Initial sputum culture grew"FEW DIPHTHEROIDS(CORYNEBACTERIUM SPECIES" Received Rocephin x5 days  Pneumothorax -treated with right chest tube initially,Resolved. Per PCCM.  Stenotrophomonas in trach aspirate from 12/22 -Felt likely secondary to colonization. received levaquinx3 days, abx  have been discontinued. Follow.  Well-controlled diabetes mellitus type 2, previously insulin-dependent prior to discharge -Hemoglobin A1c 5.7 (03/23/2020). Continue tube feeds. Sliding scale insulin, has not needed any SSI coverage in the hospital in the last few days  Anxiety -Continue Ativan as needed.   History of lymphoma -Treated in the spring 2021 prior to Covid. Will need outpatient follow-up with oncology.  Severe deconditioning/debility Continue PT OT,  LTAC placement  Dysphagia thought secondary to deconditioning, nutrition per PEG tube, started dysphagia diet 1 thin liquid on 1/ 6, speech continue following  GERD continue PPI    granulation tissue at feeding tube site Wound care consulted  And recommendation as below: Wound type: Skin breakdown at feeding tube site MASD Pressure Injury POA: NA Drainage (amount, consistency, odor)   Periwound: Dressing procedure/placement/frequency: Gently clean the area with NS, pat dry. Wipe the area with 69M skin barrier wipes [name] # [phone]) allow to dry. May place a small piece of Xeroform gauze directly on the granulation tissue and secure with split gauze and medipore tape. Change daily.  Monitor the wound area(s) for worsening of condition such as: Signs/symptoms of infection, increase in size, development of or worsening of odor, development of pain, or increased pain at the affected locations.   Notify the medical team if any of these develop.   DVT prophylaxis: enoxaparin (LOVENOX) injection 40 mg Start: 03/23/20 2200  Code Status: Full Family Communication: Patient wife over the phone on 1/5 with his permission Disposition:  [facility] to [facility]    Consultants:   Critical care  IR  Procedures:   Tracheostomy present on prior to admission  Right chest tube placed on 03/23/2020 for pneumothorax  Bronchoscopy on 03/23/2020 and 03/25/2020 for dislodged tracheostomy tube and repositioning of tracheostomy respectively   Antimicrobials:   Levaquin and Rocephin  Anti-infectives (From admission, onward)   Start     Dose/Rate Route Frequency Ordered Stop   04/04/20 1300  levofloxacin (LEVAQUIN) tablet 750 mg  Status:  Discontinued        750 mg Per Tube Daily 04/04/20 1149 04/06/20 0934   03/23/20 1200  cefTRIAXone (ROCEPHIN) 2 g in sodium chloride 0.9 % 100 mL IVPB        2 g 200 mL/hr over 30 Minutes  Intravenous Every 24 hours 03/23/20 1152 03/27/20 1258       Discharge Exam: BP 104/66 (BP Location: Left Arm)   Pulse 75   Temp (!) 97.5 F (36.4 C)   Resp (!) 24   Ht 5\' 5"  (1.651 m)   Wt 61.3 kg   SpO2 99%   BMI 22.49 kg/m   General: NAD, on trach collar, positive PEG Cardiovascular: RRR Respiratory: Normal respiratory effort  Discharge Instructions You were cared for by a hospitalist during your hospital stay. If you have any  questions about your discharge medications or the care you received while you were in the hospital after you are discharged, you can call the unit and asked to speak with the hospitalist on call if the hospitalist that took care of you is not available. Once you are discharged, your primary care physician will handle any further medical issues. Please note that NO REFILLS for any discharge medications will be authorized once you are discharged, as it is imperative that you return to your primary care physician (or establish a relationship with a primary care physician if you do not have one) for your aftercare needs so that they can reassess your need for medications and monitor your lab values.  Discharge Instructions    Diet general   Complete by: As directed    Diet recommendations: Dysphagia 1 (puree);Thin liquid Liquids provided via: Cup;Straw Medication Administration: Via alternative means Supervision: Patient able to self feed Compensations: Multiple dry swallows after each bite/sip Postural Changes and/or Swallow Maneuvers: Seated upright 90 degrees     Patient may use Passy-Muir Speech Valve: Intermittently with supervision PMSV Supervision: Intermittent  Oral Care Recommendations: Oral care BID   Discharge wound care:   Complete by: As directed    Clean with NS, wipe with skin barrier wipes, allow to dry, xeroform gauze directly on granulation tissue and secure with split gauze and medipore tape Change daily   Increase activity slowly   Complete by: As directed      Allergies as of 04/17/2020   No Known Allergies     Medication List    STOP taking these medications   atorvastatin 10 MG tablet Commonly known as: LIPITOR   CALMOSEPTINE EX   feeding supplement (PRO-STAT SUGAR FREE 64) Liqd   fludrocortisone 0.1 MG tablet Commonly known as: FLORINEF   insulin glargine 100 UNIT/ML injection Commonly known as: LANTUS   midodrine 5 MG tablet Commonly known as:  PROAMATINE   pantoprazole 40 MG tablet Commonly known as: PROTONIX Replaced by: pantoprazole sodium 40 mg/20 mL Pack   sodium chloride 0.9 % infusion   vitamin A & D ointment     TAKE these medications   acetaminophen 160 MG/5ML solution Commonly known as: TYLENOL Place 20.3 mLs (650 mg total) into feeding tube every 4 (four) hours as needed for mild pain or fever (temp > 101.5).   albuterol (2.5 MG/3ML) 0.083% nebulizer solution Commonly known as: PROVENTIL Take 3 mLs (2.5 mg total) by nebulization every 2 (two) hours as needed for wheezing.   chlorhexidine 0.12 % solution Commonly known as: PERIDEX 15 mLs by Mouth Rinse route 2 (two) times daily.   Chlorhexidine Gluconate Cloth 2 % Pads Apply 6 each topically daily. Start taking on: April 18, 2020   enoxaparin 40 MG/0.4ML injection Commonly known as: LOVENOX Inject 0.4 mLs (40 mg total) into the skin daily.   feeding supplement (PROSource TF) liquid Place 45 mLs into feeding tube 3 (three) times  daily. What changed:   how much to take  how to take this  when to take this  additional instructions   feeding supplement (OSMOLITE 1.2 CAL) Liqd Place 1,000 mLs into feeding tube continuous. What changed: You were already taking a medication with the same name, and this prescription was added. Make sure you understand how and when to take each.   ferrous sulfate 300 (60 Fe) MG/5ML syrup Place 5 mLs (300 mg total) into feeding tube daily with breakfast.   free water Soln Place 100 mLs into feeding tube every 4 (four) hours.   insulin aspart 100 UNIT/ML injection Commonly known as: novoLOG Inject 0-15 Units into the skin 3 (three) times daily with meals. What changed:   how much to take  when to take this   LORazepam 0.5 MG tablet Commonly known as: ATIVAN Place 1 tablet (0.5 mg total) into feeding tube every 4 (four) hours as needed for anxiety. What changed: how to take this   mouth rinse Liqd  solution 15 mLs by Mouth Rinse route 2 times daily at 12 noon and 4 pm.   multivitamin Liqd Place 15 mLs into feeding tube daily. What changed: when to take this   pantoprazole sodium 40 mg/20 mL Pack Commonly known as: PROTONIX Place 20 mLs (40 mg total) into feeding tube daily. Start taking on: April 18, 2020 Replaces: pantoprazole 40 MG tablet   sertraline 50 MG tablet Commonly known as: ZOLOFT Place 1 tablet (50 mg total) into feeding tube daily.   Vitamin D3 10 MCG/ML Liqd Give 400 Units by tube daily. What changed:   medication strength  how much to take  how to take this            Discharge Care Instructions  (From admission, onward)         Start     Ordered   04/17/20 0000  Discharge wound care:       Comments: Clean with NS, wipe with skin barrier wipes, allow to dry, xeroform gauze directly on granulation tissue and secure with split gauze and medipore tape Change daily   04/17/20 1258         No Known Allergies  Follow-up Information    Erick Colace, NP Follow up.   Specialties: Nurse Practitioner, Acute Care, Pulmonary Disease Why: trach clinic follow up Contact information: [address] [place] [address] [phone]        Saguier, Percell Miller, PA-C Follow up.   Specialties: Internal Medicine, Family Medicine Contact information: [address] [place] [address] [facility] Follow up.   Specialty: Oncology Why: for h/o lymphoma in 2021 Contact information: [place] [id number] [place] [id number]               The results of significant diagnostics from this hospitalization (including imaging, microbiology, ancillary and laboratory) are listed below for reference.    Significant Diagnostic Studies: DG Chest 1 View  Result Date: 03/26/2020 CLINICAL DATA:  Status post chest  tube removal EXAM: CHEST  1 VIEW COMPARISON:  Earlier today FINDINGS: Tracheostomy tube tip is above the carina. There is a left chest wall port a catheter with tip in the cavoatrial junction. Stable cardiomediastinal contours. Diffuse bilateral peripheral predominant interstitial and airspace opacities are unchanged. The right-sided chest tube is been removed. No pneumothorax identified. IMPRESSION: 1. No  pneumothorax status post chest tube removal. 2. No change in aeration to the lungs compared with prior exam. Electronically Signed   By: Kerby Moors M.D.   On: 03/26/2020 13:46   CT SOFT TISSUE NECK WO CONTRAST  Result Date: 03/24/2020 CLINICAL DATA:  Possible pneumomediastinum EXAM: CT NECK WITHOUT CONTRAST TECHNIQUE: Multidetector CT imaging of the neck was performed following the standard protocol without intravenous contrast. COMPARISON:  February 28, 2019 FINDINGS: Pharynx and larynx: Tracheostomy device is present. The tube enters the neck eccentric to the right with displacement of proximal subglottic airway to the left. There is distension of the visualized trachea and an over dilated balloon appears to be present within the airway. Pharynx is unremarkable. Salivary glands: Unremarkable. Thyroid: Unremarkable. Lymph nodes: No enlarged lymph nodes identified. Vascular: Mild calcified plaque at the ICA origins. Limited intracranial: No acute abnormality. Visualized orbits: Unremarkable. Mastoids and visualized paranasal sinuses: Aerated. Skeleton: Multilevel degenerative changes of the cervical spine. Upper chest: Refer to dedicated chest imaging. Other: None. IMPRESSION: Malpositioned tracheostomy device with balloon distending the trachea. Electronically Signed   By: Macy Mis M.D.   On: 03/24/2020 13:56   CT CHEST WO CONTRAST  Result Date: 03/24/2020 CLINICAL DATA:  History of COVID pneumonia with cough and chest pain. EXAM: CT CHEST WITHOUT CONTRAST TECHNIQUE: Multidetector CT imaging  of the chest was performed following the standard protocol without IV contrast. COMPARISON:  Chest CT from May 2021. FINDINGS: Cardiovascular: The heart is mildly enlarged but stable. No pericardial effusion. Stable mild tortuosity and ectasia of the thoracic aorta. The pulmonary arteries are enlarged suggesting pulmonary hypertension. Mediastinum/Nodes: Enlarged mediastinal and hilar lymph nodes likely due to the patient's underlying lung disease. The esophagus is grossly normal. Stable tracheostomy tube. Lungs/Pleura: Severe changes pulmonary fibrosis and extensive bronchiectasis. No acute overlying pulmonary process is identified. No worrisome pulmonary lesions. No pneumothorax or pneumomediastinum. There is a stable drainage catheter in the right lower thorax. Upper Abdomen: No significant upper abdominal findings. Stable gallstones and right renal calculi. Musculoskeletal: No significant bony findings. IMPRESSION: 1. Severe changes of pulmonary fibrosis and extensive bronchiectasis. 2. No acute overlying pulmonary process is identified. 3. Stable drainage catheter in the right lower thorax. No pneumothorax or pneumomediastinum. 4. Enlarged mediastinal and hilar lymph nodes likely due to the patient's underlying lung disease. 5. Enlarged pulmonary arteries suggesting pulmonary hypertension. 6. Stable gallstones and right renal calculi. Electronically Signed   By: Marijo Sanes M.D.   On: 03/24/2020 15:36   DG Chest Port 1 View  Result Date: 04/07/2020 CLINICAL DATA:  Respiratory failure EXAM: PORTABLE CHEST 1 VIEW COMPARISON:  03/31/2020, CT 03/24/2020 FINDINGS: Tracheostomy is unchanged. Lung volumes are small and pulmonary insufflation has diminished since prior examination. Superimposed extensive peripheral pulmonary infiltrate is again identified, related to underlying interstitial lung disease and architectural distortion noted on prior CT of 03/24/2020, and is likely stable when accounting for  diminished pulmonary insufflation. No superimposed focal consolidation. No pneumothorax or pleural effusion. Cardiac size is mildly enlarged, unchanged. Left internal jugular chest port is unchanged with its tip within the right atrium. Right paratracheal soft tissue thickening relates to mediastinal adenopathy, better seen on a prior CT examination. No acute bone abnormality. IMPRESSION: Progressive pulmonary hypoinflation. Stable pulmonary infiltrates related to underlying interstitial lung disease. Stable right paratracheal soft tissue thickening related to underlying known mediastinal adenopathy. Electronically Signed   By: Fidela Salisbury MD   On: 04/07/2020 05:06   DG Chest Port 1 View  Result Date:  03/31/2020 CLINICAL DATA:  Follow-up ARDS EXAM: PORTABLE CHEST 1 VIEW COMPARISON:  03/28/2020 FINDINGS: Cardiac shadow is enlarged but stable. Tracheostomy tube and left chest wall port are again seen and stable. Diffuse bilateral airspace disease is again identified stable in appearance from the prior exam. No new focal abnormality is seen. IMPRESSION: Stable bilateral airspace opacity. Electronically Signed   By: Inez Catalina M.D.   On: 03/31/2020 09:41   DG Chest Port 1 View  Result Date: 03/28/2020 CLINICAL DATA:  Acute respiratory failure with hypoxemia. EXAM: PORTABLE CHEST 1 VIEW COMPARISON:  03/27/2020 FINDINGS: Tracheostomy tube and left-sided Port-A-Cath remains in appropriate position. Low lung volumes are again seen. Bilateral airspace disease with predominance in the peripheral lung zones shows no significant change. Mild cardiomegaly stable. No evidence of pneumothorax or pleural effusion IMPRESSION: No significant change in bilateral airspace disease with peripheral predominance, and mild cardiomegaly. Electronically Signed   By: Marlaine Hind M.D.   On: 03/28/2020 07:52   DG CHEST PORT 1 VIEW  Result Date: 03/27/2020 CLINICAL DATA:  Follow-up pneumothorax EXAM: PORTABLE CHEST 1 VIEW  COMPARISON:  03/26/2020 FINDINGS: Tracheostomy tube and left-sided chest wall port are again seen and stable. Diffuse bilateral airspace opacities are again identified stable from the prior exam. No sizable pneumothorax is seen. No new focal abnormality is noted. IMPRESSION: Stable bilateral airspace opacities. No new focal abnormality noted. Electronically Signed   By: Inez Catalina M.D.   On: 03/27/2020 08:01   DG CHEST PORT 1 VIEW  Result Date: 03/26/2020 CLINICAL DATA:  Tracheostomy exchange EXAM: PORTABLE CHEST 1 VIEW COMPARISON:  March 26, 2020 study obtained earlier in the day. Chest CT March 24, 2020 FINDINGS: Tracheostomy catheter tip is 3.6 cm above the carina. Chest tube present on the right. Port-A-Cath tip is in the right atrium, stable. No pneumothorax. Patchy interstitial and airspace opacity bilaterally persists without change. Heart is upper normal in size with pulmonary vascularity normal. No adenopathy. No bone lesions. IMPRESSION: Tube and catheter positions as described without evident pneumothorax. Interstitial and airspace opacity remain bilaterally without change. Recent CT examination revealed extensive fibrosis. Current appearance is stable and may be entirely due to fibrosis. It is difficult to exclude ill-defined superimposed infiltrate in this circumstance. Electronically Signed   By: Lowella Grip III M.D.   On: 03/26/2020 12:12   DG Chest Port 1 View  Result Date: 03/26/2020 CLINICAL DATA:  Respiratory failure. EXAM: PORTABLE CHEST 1 VIEW COMPARISON:  03/25/2020 FINDINGS: 0456 hours. Low volumes with asymmetric elevation right hemidiaphragm. Right pleural drain remains in place without evidence for right-sided pneumothorax. Tracheostomy tube and left Port-A-Cath again noted. Diffuse interstitial and peripherally oriented ground-glass airspace disease is stable in the interval. IMPRESSION: Stable exam. Right pleural drain remains in place without evidence for  right-sided pneumothorax. Electronically Signed   By: Misty Stanley M.D.   On: 03/26/2020 07:14   DG CHEST PORT 1 VIEW  Result Date: 03/25/2020 CLINICAL DATA:  Tracheostomy tube exchange. EXAM: PORTABLE CHEST 1 VIEW COMPARISON:  Chest x-ray 03/25/2020, CT chest 03/24/2020. FINDINGS: Tracheostomy tube with tip terminating approximately 5.5 cm above the carina. Accessed left chest wall Port-A-Cath with tip overlying the expected region of the right ventricle. Right pigtail catheter with tip overlying the inferior medial hemithorax. Surgical clips again noted overlying the right apex. The heart size and mediastinal contours are unchanged Persistent coarsened interstitial markings consistent with known fibrosis and bronchiectasis. No new focal consolidation. No pleural effusion. No pneumothorax. No acute osseous abnormality.  IMPRESSION: 1. Tracheostomy tube with tip terminating 5.5 cm above the carina. Other lines and tubes are stable. 2. Persistent coarsened interstitial markings consistent with known fibrosis and bronchiectasis with no new focal consolidation or pleural effusion compared to 03/25/2020 4:52 a.m. Electronically Signed   By: Iven Finn M.D.   On: 03/25/2020 11:01   DG Chest Port 1 View  Result Date: 03/25/2020 CLINICAL DATA:  65 year old male with respiratory failure. Prior COVID-19. Right pneumothorax. EXAM: PORTABLE CHEST 1 VIEW COMPARISON:  Portable chest and chest CT 03/24/2020 and earlier. FINDINGS: Portable AP semi upright view at 0452 hours. Stable tracheostomy tube. Stable left chest power port. Stable right side pigtail chest tube. Low lung volumes with coarse bilateral pulmonary opacity, confluent peripheral opacity in both lungs. No pneumothorax identified. Stable ventilation since yesterday. Stable cardiac size and mediastinal contours. IMPRESSION: 1. Stable lines and tubes. No pneumothorax identified. 2. Stable ventilation with bilateral lung disease/pulmonary fibrosis  demonstrated by CT yesterday. Electronically Signed   By: Genevie Ann M.D.   On: 03/25/2020 07:26   DG Chest Port 1 View  Result Date: 03/24/2020 CLINICAL DATA:  History of right pneumothorax. EXAM: PORTABLE CHEST 1 VIEW COMPARISON:  03/23/2020.  09/15/2019.  CT 08/20/2019. FINDINGS: Tracheostomy tube, PowerPort catheter, right chest tube in stable position. Persistent rounded air collection is again noted over the superior mediastinum as noted on prior study. This could represent over distention of tracheostomy balloon. An air collection in the superior mediastinum cannot be excluded. Again clinical correlation suggested. Very tiny right apical pneumothorax is noted on today's exam. This is much smaller than noted on prior study of 03/23/2020. Low lung volumes. Persistent chronic bilateral interstitial changes. Heart size stable. No acute bony abnormality. IMPRESSION: 1. Lines and tubes including right chest tube in stable position. Very tiny right apical pneumothorax is noted on today's exam. This is much smaller than on prior study of 03/23/2020. 2. Persistent rounded air again noted over the superior mediastinum. Again this could represent over distension of the tracheostomy balloon. An air collection in superior mediastinum cannot be excluded. Again clinical correlation suggested. 3. Chronic interstitial lung disease again noted. Electronically Signed   By: Marcello Moores  Register   On: 03/24/2020 05:57   DG CHEST PORT 1 VIEW  Result Date: 03/23/2020 CLINICAL DATA:  Chest tube placement. EXAM: PORTABLE CHEST 1 VIEW COMPARISON:  Same day. FINDINGS: Stable cardiomediastinal silhouette. Tracheostomy tube is unchanged in position. Interval placement of right-sided pigtail chest tube. Pneumothorax has resolved. Stable bilateral patchy airspace opacities are noted concerning for multifocal inflammation or chronic lung disease. Left subclavian Port-A-Cath is unchanged in position. Bony thorax is unremarkable.  IMPRESSION: Interval placement of right-sided pigtail chest tube. Pneumothorax has resolved. Stable bilateral patchy airspace opacities are noted concerning for multifocal inflammation or chronic lung disease. Electronically Signed   By: Marijo Conception M.D.   On: 03/23/2020 14:04   DG Chest Port 1 View  Result Date: 03/23/2020 CLINICAL DATA:  Questionable sepsis evaluate for abnormality with increasing cough and secretions since Friday, history of pneumonia and pneumothorax. EXAM: PORTABLE CHEST 1 VIEW COMPARISON:  October 04, 2019 FINDINGS: LEFT-sided Port-A-Cath in place as on the previous study, tip in the area of the caval to atrial junction. Cardiomediastinal contours show mild shift from RIGHT to LEFT. Tracheostomy tube is in place, balloon and tip over the level of the midclavicular trachea. Note that the rounded lucency suspected to represent the tracheostomy balloon measures 5 cm in the tracheal air column at  this level measures approximately 2.5 cm. Moderate RIGHT pneumothorax, approximately 3.3 cm along the upper lateral chest. Diffuse interstitial disease with patchy areas of airspace disease suggested in the LEFT chest along the periphery. On limited assessment no acute skeletal process. IMPRESSION: 1. Moderate RIGHT pneumothorax with signs of tension and mild mediastinal shift from RIGHT to LEFT. 2. Lucency with rounded margins at the thoracic inlet projecting over the tracheal air column may represent the tracheostomy balloon, this is much larger than the tracheal air column, correlate with any signs of tracheal injury. CT may be helpful for further assessment of the patient is able. 3. Background interstitial lung disease with areas of patchy opacity suggested on the LEFT, difficult to exclude the possibility of concomitant infection. Critical Value/emergent results were called by telephone at the time of interpretation on 03/23/2020 at 10:29 am to provider [facility] , who verbally  acknowledged these results. Electronically Signed   By: Zetta Bills M.D.   On: 03/23/2020 10:31   DG Swallowing Func-Speech Pathology  Result Date: 04/08/2020 Objective Swallowing Evaluation: Type of Study: MBS-Modified Barium Swallow Study  Patient Details Name: MACK ALVIDREZ MRN: [id number] Date of Birth: 11-19-1955 Today's Date: 04/08/2020 Time: SLP Start Time (ACUTE ONLY): 0831 -SLP Stop Time (ACUTE ONLY): 0849 SLP Time Calculation (min) (ACUTE ONLY): 18 min Past Medical History: Past Medical History: Diagnosis Date . Abscess of muscle 08/10/2011  staph infection of right hip  . Acute on chronic respiratory failure with hypoxia ([place])  . Acute respiratory distress syndrome (ARDS) due to COVID-19 virus ([place])  . COVID-19 virus infection  . Diabetes mellitus without complication ([place])  . GERD (gastroesophageal reflux disease)   rare reflux - no meds for reflux - NO PROBLEM IN PAST SEVERAL YRS . HCAP (healthcare-associated pneumonia)  . Healthcare-associated pneumonia  . Hip dysplasia, congenital   no surgery as a child for hip dysplasia - has had bilateral hip replacements as an adult . Hodgkin lymphoma ([place])  . Hodgkin's lymphoma ([place])  . Hypertension  . Pancreatitis  . Pneumothorax, acute  . Postoperative anemia due to acute blood loss 09/07/2012 . Septic arthritis of hip ([place]) 09/05/2012  PT'S TOTAL HIP JOINT REMOVED - ANTIBIOTIC SPACE PLACED AND PT HAS FINISHED IV ANTIBIOTICS ( PICC LINE REMOVED) . Sleep apnea   USES CPAP Past Surgical History: Past Surgical History: Procedure Laterality Date . COLONOSCOPY  04/26/2007 . HERNIA REPAIR    inguinal hernia x3 . IR GASTROSTOMY TUBE MOD SED  08/22/2019 . IR IMAGING GUIDED PORT INSERTION  03/29/2019 . JOINT REPLACEMENT  2002 & 2007  bilateral hip replacement . LYMPH NODE BIOPSY Right 03/20/2019  Procedure: DEEP RIGHT SUPRACLAVICULAR LYMPH NODE EXCISION;  Surgeon: Fanny Skates, MD;  Location: [facility];  Service: General;  Laterality: Right; .  MULTIPLE EXTRACTIONS WITH ALVEOLOPLASTY  07/28/2011  Procedure: MULTIPLE EXTRACION WITH ALVEOLOPLASTY;  Surgeon: Lenn Cal, DDS;  Location: [facility];  Service: Oral Surgery;  Laterality: N/A;  Extraction of tooth #'s 2,3,4,5,6,11,12,13,15,19,22 with alveoloplasty. . shoulder repair - right for separation of shoulder   . TEE WITHOUT CARDIOVERSION  07/29/2011  Procedure: TRANSESOPHAGEAL ECHOCARDIOGRAM (TEE);  Surgeon: Josue Hector, MD;  Location: [facility];  Service: Cardiovascular;  Laterality: N/A; . TOTAL HIP REVISION Right 09/05/2012  Procedure: RIGHT HIP RESECTION ARTHROPLASTY WITH ANTIBIOTIC SPACERS;  Surgeon: [name], MD;  Location: [facility];  Service: Orthopedics;  Laterality: Right; . 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 S/p chest tube and Tracheostomy Exchange 12/13.  PEG.  No record of prior swallow or PMV evaluations. Pt originally had trach placed in April 2021 after admission for severe acute respiratory failure with hypoxemia in the setting of receiving chemotherapy for Hodgkin's lymphoma.  Pt now on trach collar - 10 L 40% and uses home vent at night.  CXR Progressive pulmonary hypoinflation.     Stable pulmonary infiltrates related to underlying interstitial lung  disease.     Stable right paratracheal soft tissue thickening related to  underlying known mediastinal adenopathy.[name] changed yesterday to Shiley #6 XL.  MBS indicated to assess for po readiness.  Pt with h/o dysphagia and had Gtube placed 09/09/2019  Subjective:  alert, desires ativan but approved to wait until after mbs, wanted suctioned- advised rn and RT Assessment / Plan / Recommendation CHL IP CLINICAL IMPRESSIONS 04/08/2020 Clinical Impression Patient presents with mild oropharyngeal dysphagia c/b weakness resulting in decreased laryngeal closure and epiglottic deflection with resultant pharyngeal retention that mixed with secretions. No penetration or aspiration of all po observed - however as pt's RR incr and O2 sats decreased, he will be at increased risk of more than a few bites/sips.  Pt was not tested with PMSV as within a few minutes of placement, pt desated and reported difficulty breathing.  Upon removal of valve, did not note breath stacking/CO2 retention. Re: pharyngeal residuals Pt did not sense residuals in pharynx but was able to swallow to help decrease.  Poor mastication abilities as pt did not have dentures in place - and with his WOB and deconditioning, doubtful he will tolerate solids.  Due to laryngeal elevation deficits - pt must be as upright for po intake.  No coughing or throat clearing noted during MBS, however following - pt with coughing after he was moved into bed.  Of note, pt's desaturated during MBS to lowest 82% and RR increased to 30's.  Test was expedited rapidly due to desaturations.  Recommend intake from floor stock provided by RN only after suctioning completed and use tube feeding for nutrition. SLP will follow up for dysphagia management/strengthening of laryngeal closure/epiglottic deflection to decrease pharyngeal retention and maximize airway protection. SLP Visit Diagnosis Dysphagia, pharyngeal phase (R13.13) Attention and concentration deficit following -- Frontal lobe and executive function deficit following -- Impact on safety and function --   No flowsheet data found.  Prognosis 04/08/2020 Prognosis for Safe Diet Advancement Fair Barriers to Reach Goals Severity of deficits Barriers/Prognosis Comment -- CHL IP DIET  RECOMMENDATION 04/08/2020 SLP Diet Recommendations Dysphagia 1 (Puree) solids;Thin liquid Liquid Administration via Cup;Straw Medication Administration Via alternative means Compensations Multiple dry swallows after each bite/sip;Other (Comment) Postural Changes Remain semi-upright after after feeds/meals (Comment);Seated upright at 90 degrees   CHL IP OTHER RECOMMENDATIONS 04/08/2020 Recommended Consults -- Oral Care Recommendations Oral care QID Other Recommendations --   CHL IP FOLLOW UP RECOMMENDATIONS 04/08/2020 Follow up Recommendations Inpatient Rehab   CHL IP FREQUENCY AND DURATION 04/08/2020 Speech Therapy Frequency (ACUTE ONLY) min 2x/week Treatment Duration 2 weeks      CHL IP ORAL PHASE 04/08/2020 Oral  Phase Impaired Oral - Pudding Teaspoon -- Oral - Pudding Cup -- Oral - Honey Teaspoon -- Oral - Honey Cup -- Oral - Nectar Teaspoon WFL;Piecemeal swallowing Oral - Nectar Cup -- Oral - Nectar Straw -- Oral - Thin Teaspoon WFL;Piecemeal swallowing Oral - Thin Cup [facility];Piecemeal swallowing Oral - Thin Straw WFL;Piecemeal swallowing Oral - Puree Premature spillage;Piecemeal swallowing Oral - Mech Soft Impaired mastication;Weak lingual manipulation;Piecemeal swallowing Oral - Regular -- Oral - Multi-Consistency -- Oral - Pill -- Oral Phase - Comment --  CHL IP PHARYNGEAL PHASE 04/08/2020 Pharyngeal Phase Impaired Pharyngeal- Pudding Teaspoon -- Pharyngeal -- Pharyngeal- Pudding Cup -- Pharyngeal -- Pharyngeal- Honey Teaspoon -- Pharyngeal -- Pharyngeal- Honey Cup -- Pharyngeal -- Pharyngeal- Nectar Teaspoon Pharyngeal residue - valleculae;Pharyngeal residue - pyriform;Reduced laryngeal elevation Pharyngeal Material does not enter airway Pharyngeal- Nectar Cup -- Pharyngeal -- Pharyngeal- Nectar Straw Pharyngeal residue - valleculae;Pharyngeal residue - pyriform;Reduced laryngeal elevation Pharyngeal -- Pharyngeal- Thin Teaspoon Reduced laryngeal elevation;Pharyngeal residue - valleculae;Pharyngeal residue -  pyriform Pharyngeal Material does not enter airway Pharyngeal- Thin Cup Pharyngeal residue - valleculae;Pharyngeal residue - pyriform;Reduced laryngeal elevation Pharyngeal Material does not enter airway Pharyngeal- Thin Straw Pharyngeal residue - valleculae;Pharyngeal residue - pyriform;Reduced laryngeal elevation Pharyngeal Material does not enter airway Pharyngeal- Puree Reduced laryngeal elevation;Pharyngeal residue - valleculae Pharyngeal Material does not enter airway Pharyngeal- Mechanical Soft Reduced tongue base retraction;Pharyngeal residue - valleculae Pharyngeal Material does not enter airway Pharyngeal- Regular -- Pharyngeal -- Pharyngeal- Multi-consistency -- Pharyngeal -- Pharyngeal- Pill -- Pharyngeal -- Pharyngeal Comment --  CHL IP CERVICAL ESOPHAGEAL PHASE 04/08/2020 Cervical Esophageal Phase WFL Pudding Teaspoon -- Pudding Cup -- Honey Teaspoon -- Honey Cup -- Nectar Teaspoon -- Nectar Cup -- Nectar Straw -- Thin Teaspoon -- Thin Cup -- Thin Straw -- Puree -- Mechanical Soft -- Regular -- Multi-consistency -- Pill -- Cervical Esophageal Comment -- Kathleen Lime, MS [facility] SLP Acute Rehab Services Office [phone] Pager [phone] [name] 04/08/2020, 9:34 AM               Microbiology: No results found for this or any previous visit (from the past 240 hour(s)).   Labs: Basic Metabolic Panel: Recent Labs  Lab 04/11/20 0410 04/12/20 0232 04/13/20 0328 04/15/20 0400  NA 138 135  --  138  K 4.0 4.4  --  4.3  CL 96* 95*  --  95*  CO2 34* 32  --  33*  GLUCOSE 108* 150*  --  130*  BUN 28* 26*  --  27*  CREATININE 0.55* 0.63 0.57* 0.53*  CALCIUM 9.2 8.7*  --  9.2   Liver Function Tests: No results for input(s): AST, ALT, ALKPHOS, BILITOT, PROT, ALBUMIN in the last 168 hours. No results for input(s): LIPASE, AMYLASE in the last 168 hours. No results for input(s): AMMONIA in the last 168 hours. CBC: Recent Labs  Lab 04/12/20 0232 04/15/20 0400  WBC  --  7.7  HGB 9.0*  9.4*  HCT 29.2* 31.0*  MCV  --  93.1  PLT  --  237   Cardiac Enzymes: No results for input(s): CKTOTAL, CKMB, CKMBINDEX, TROPONINI in the last 168 hours. BNP: BNP (last 3 results) Recent Labs    07/08/19 1815 07/11/19 0212 07/14/19 0458  BNP 407.1* 224.8* 55.9    ProBNP (last 3 results) No results for input(s): PROBNP in the last 8760 hours.  CBG: Recent Labs  Lab 04/16/20 1204 04/16/20 1644 04/16/20 2205 04/17/20 0727 04/17/20 1129  GLUCAP 92 91 113* 123* 107*  Signed:  Florencia Reasons MD, PhD, FACP  [facility] 04/17/2020, 2:11 PM

## 2020-04-17 NOTE — TOC Transition Note (Signed)
Transition of Care [facility]) - CM/SW Discharge Note   Patient Details  Name: Steve [name] MRN: [id number] Date of Birth: 11-30-1955  [facility]) CM/SW Contact:  Ross Ludwig, LCSW Phone Number: 04/17/2020, 2:37 PM   Clinical Narrative:     CSW was informed that patient has been approved for LTAC at [facility], and he can discharge today.  CSW notified patient and his wife.  Patient to be d/c'ed today to [facility].  Patient and family agreeable to plans will transport via [facility] RN to call report to [phone] or [phone].      Final next level of care: Long Term Acute Care (LTAC) Barriers to Discharge: Barriers Resolved   Patient Goals and CMS Choice Patient states their goals for this hospitalization and ongoing recovery are:: To go to LTAC then return back home. CMS Medicare.gov Compare Post Acute Care list provided to:: Patient Choice offered to / list presented to : Spouse,Patient  Discharge Placement              Patient chooses bed at: Other - please specify in the comment section below: ([facility]) Patient to be transferred to facility by: [place] Name of family member notified: Wife [name] Patient and family notified of of transfer: 04/17/20  Discharge Plan and Services   Discharge Planning Services: CM Consult                                 Social Determinants of Health (SDOH) Interventions     Readmission Risk Interventions Readmission Risk Prevention Plan 07/22/2019  Transportation Screening Complete  Medication Review [employer]) Complete  Some recent data might be hidden

## 2020-04-17 NOTE — Progress Notes (Signed)
Arranged patient's transfer to [facility]. Report given to [facility]. Patient made aware and agreeable.

## 2020-04-17 NOTE — Progress Notes (Signed)
Patients O2 decreased to 73% while in room giving meds, patient stated he felt like the inner canular needed to be cleaned.  Inner canula removed with large blob of mucous blocking tube. Cannula cleaned and replaced.  O2 sats immediately increased back to 92%

## 2020-04-17 NOTE — TOC Progression Note (Addendum)
Transition of Care [facility]) - Progression Note    Patient Details  Name: Steve [name] MRN: [id number] Date of Birth: [birth year]  [facility]) CM/SW Contact  Ross Ludwig, [place] Phone Number: 04/17/2020, 12:43 PM  Clinical Narrative:     CSW received a message from [place] at [facility], patient has been approved by [facility], and they can accept him today.  CSW notified attending physician and bedside nurse.  CSW was informed that the admitting physician is Dr. Vista Lawman, patient will be going to room 403, report to be called to [phone] or [phone].  CSW updated patient and his wife Steve [name], via phone, they are aware of planned discharge for today.   Expected Discharge Plan: Long Term Acute Care (LTAC) Barriers to Discharge: Insurance Authorization  Expected Discharge Plan and Services Expected Discharge Plan: [place] (LTAC)   Discharge Planning Services: CM Consult   Living arrangements for the past 2 months: Mobile Home                                       Social Determinants of Health (SDOH) Interventions    Readmission Risk Interventions Readmission Risk Prevention Plan 07/22/2019  Transportation Screening Complete  Medication Review [employer] photographer) Complete  Some recent data might be hidden

## 2020-04-17 NOTE — Progress Notes (Signed)
[facility] here to transport patient.  Patient suctioned and inner cannula cleaned before transport to clear of phlegm.  Belongings bagged and sent with patient.  Patient in NAD at time of DC.  Port flushed with NS and left accessed per request of staff at [facility].  IV team aware of patients transfer to [facility] with Port left accessed as well.

## 2020-04-17 NOTE — Plan of Care (Signed)
  Problem: Health Behavior/Discharge Planning: Goal: Ability to manage health-related needs will improve Outcome: Progressing   Problem: Activity: Goal: Risk for activity intolerance will decrease Outcome: Progressing   Problem: Coping: Goal: Level of anxiety will decrease Outcome: Progressing   Problem: Respiratory: Goal: Ability to maintain adequate ventilation will improve Outcome: Progressing Goal: Ability to maintain a clear airway will improve Outcome: Progressing

## 2020-04-18 DIAGNOSIS — J189 Pneumonia, unspecified organism: Secondary | ICD-10-CM

## 2020-04-18 DIAGNOSIS — J1282 Pneumonia due to coronavirus disease 2019: Secondary | ICD-10-CM

## 2020-04-18 DIAGNOSIS — U071 COVID-19: Secondary | ICD-10-CM

## 2020-04-18 DIAGNOSIS — J9621 Acute and chronic respiratory failure with hypoxia: Secondary | ICD-10-CM

## 2020-04-19 DIAGNOSIS — U071 COVID-19: Secondary | ICD-10-CM

## 2020-04-19 DIAGNOSIS — J9621 Acute and chronic respiratory failure with hypoxia: Secondary | ICD-10-CM

## 2020-04-19 DIAGNOSIS — J1282 Pneumonia due to coronavirus disease 2019: Secondary | ICD-10-CM

## 2020-04-19 DIAGNOSIS — J189 Pneumonia, unspecified organism: Secondary | ICD-10-CM

## 2020-04-27 DIAGNOSIS — J189 Pneumonia, unspecified organism: Secondary | ICD-10-CM

## 2020-04-27 DIAGNOSIS — J1282 Pneumonia due to coronavirus disease 2019: Secondary | ICD-10-CM

## 2020-04-27 DIAGNOSIS — U071 COVID-19: Secondary | ICD-10-CM

## 2020-04-27 DIAGNOSIS — J9621 Acute and chronic respiratory failure with hypoxia: Secondary | ICD-10-CM

## 2020-04-28 DIAGNOSIS — U071 COVID-19: Secondary | ICD-10-CM

## 2020-04-28 DIAGNOSIS — J1282 Pneumonia due to coronavirus disease 2019: Secondary | ICD-10-CM

## 2020-04-28 DIAGNOSIS — J189 Pneumonia, unspecified organism: Secondary | ICD-10-CM

## 2020-04-28 DIAGNOSIS — J9621 Acute and chronic respiratory failure with hypoxia: Secondary | ICD-10-CM

## 2020-04-29 DIAGNOSIS — J1282 Pneumonia due to coronavirus disease 2019: Secondary | ICD-10-CM

## 2020-04-29 DIAGNOSIS — J9621 Acute and chronic respiratory failure with hypoxia: Secondary | ICD-10-CM

## 2020-04-29 DIAGNOSIS — U071 COVID-19: Secondary | ICD-10-CM

## 2020-04-29 DIAGNOSIS — J189 Pneumonia, unspecified organism: Secondary | ICD-10-CM

## 2020-04-30 DIAGNOSIS — U071 COVID-19: Secondary | ICD-10-CM

## 2020-04-30 DIAGNOSIS — J1282 Pneumonia due to coronavirus disease 2019: Secondary | ICD-10-CM

## 2020-04-30 DIAGNOSIS — J189 Pneumonia, unspecified organism: Secondary | ICD-10-CM

## 2020-04-30 DIAGNOSIS — J9621 Acute and chronic respiratory failure with hypoxia: Secondary | ICD-10-CM

## 2020-05-01 DIAGNOSIS — J189 Pneumonia, unspecified organism: Secondary | ICD-10-CM

## 2020-05-01 DIAGNOSIS — J1282 Pneumonia due to coronavirus disease 2019: Secondary | ICD-10-CM

## 2020-05-01 DIAGNOSIS — U071 COVID-19: Secondary | ICD-10-CM

## 2020-05-01 DIAGNOSIS — J9621 Acute and chronic respiratory failure with hypoxia: Secondary | ICD-10-CM

## 2020-05-02 DIAGNOSIS — J189 Pneumonia, unspecified organism: Secondary | ICD-10-CM

## 2020-05-02 DIAGNOSIS — J9621 Acute and chronic respiratory failure with hypoxia: Secondary | ICD-10-CM

## 2020-05-02 DIAGNOSIS — U071 COVID-19: Secondary | ICD-10-CM

## 2020-05-02 DIAGNOSIS — J1282 Pneumonia due to coronavirus disease 2019: Secondary | ICD-10-CM

## 2020-05-03 DIAGNOSIS — J9621 Acute and chronic respiratory failure with hypoxia: Secondary | ICD-10-CM

## 2020-05-03 DIAGNOSIS — J189 Pneumonia, unspecified organism: Secondary | ICD-10-CM

## 2020-05-03 DIAGNOSIS — J1282 Pneumonia due to coronavirus disease 2019: Secondary | ICD-10-CM

## 2020-05-03 DIAGNOSIS — U071 COVID-19: Secondary | ICD-10-CM

## 2020-05-11 DIAGNOSIS — J189 Pneumonia, unspecified organism: Secondary | ICD-10-CM

## 2020-05-11 DIAGNOSIS — J9621 Acute and chronic respiratory failure with hypoxia: Secondary | ICD-10-CM

## 2020-05-11 DIAGNOSIS — J1282 Pneumonia due to coronavirus disease 2019: Secondary | ICD-10-CM

## 2020-05-11 DIAGNOSIS — U071 COVID-19: Secondary | ICD-10-CM

## 2020-05-12 DIAGNOSIS — J189 Pneumonia, unspecified organism: Secondary | ICD-10-CM

## 2020-05-12 DIAGNOSIS — J1282 Pneumonia due to coronavirus disease 2019: Secondary | ICD-10-CM

## 2020-05-12 DIAGNOSIS — U071 COVID-19: Secondary | ICD-10-CM

## 2020-05-12 DIAGNOSIS — J9621 Acute and chronic respiratory failure with hypoxia: Secondary | ICD-10-CM

## 2020-05-13 DIAGNOSIS — J1282 Pneumonia due to coronavirus disease 2019: Secondary | ICD-10-CM

## 2020-05-13 DIAGNOSIS — J189 Pneumonia, unspecified organism: Secondary | ICD-10-CM

## 2020-05-13 DIAGNOSIS — J9621 Acute and chronic respiratory failure with hypoxia: Secondary | ICD-10-CM

## 2020-05-13 DIAGNOSIS — U071 COVID-19: Secondary | ICD-10-CM

## 2020-05-14 DIAGNOSIS — J1282 Pneumonia due to coronavirus disease 2019: Secondary | ICD-10-CM

## 2020-05-14 DIAGNOSIS — J189 Pneumonia, unspecified organism: Secondary | ICD-10-CM

## 2020-05-14 DIAGNOSIS — U071 COVID-19: Secondary | ICD-10-CM

## 2020-05-14 DIAGNOSIS — J9621 Acute and chronic respiratory failure with hypoxia: Secondary | ICD-10-CM

## 2020-05-15 DIAGNOSIS — U071 COVID-19: Secondary | ICD-10-CM

## 2020-05-15 DIAGNOSIS — J1282 Pneumonia due to coronavirus disease 2019: Secondary | ICD-10-CM

## 2020-05-15 DIAGNOSIS — J189 Pneumonia, unspecified organism: Secondary | ICD-10-CM

## 2020-05-15 DIAGNOSIS — J9621 Acute and chronic respiratory failure with hypoxia: Secondary | ICD-10-CM

## 2020-05-16 DIAGNOSIS — J9621 Acute and chronic respiratory failure with hypoxia: Secondary | ICD-10-CM

## 2020-05-16 DIAGNOSIS — U071 COVID-19: Secondary | ICD-10-CM

## 2020-05-16 DIAGNOSIS — J189 Pneumonia, unspecified organism: Secondary | ICD-10-CM

## 2020-05-16 DIAGNOSIS — J1282 Pneumonia due to coronavirus disease 2019: Secondary | ICD-10-CM

## 2020-05-17 DIAGNOSIS — J1282 Pneumonia due to coronavirus disease 2019: Secondary | ICD-10-CM

## 2020-05-17 DIAGNOSIS — U071 COVID-19: Secondary | ICD-10-CM

## 2020-05-17 DIAGNOSIS — J189 Pneumonia, unspecified organism: Secondary | ICD-10-CM

## 2020-05-17 DIAGNOSIS — J9621 Acute and chronic respiratory failure with hypoxia: Secondary | ICD-10-CM

## 2020-05-18 ENCOUNTER — Telehealth: Payer: BC Managed Care – PPO | Admitting: Medical

## 2020-05-19 ENCOUNTER — Other Ambulatory Visit: Payer: Self-pay

## 2020-05-19 ENCOUNTER — Telehealth (INDEPENDENT_AMBULATORY_CARE_PROVIDER_SITE_OTHER): Payer: BC Managed Care – PPO | Admitting: Medical

## 2020-05-19 VITALS — BP 110/73 | HR 87

## 2020-05-19 DIAGNOSIS — C8190 Hodgkin lymphoma, unspecified, unspecified site: Secondary | ICD-10-CM | POA: Diagnosis not present

## 2020-05-19 NOTE — Progress Notes (Addendum)
Subjective:    Patient ID: Steve [name], male    DOB: December 10, 1955, 65 y.o.   MRN: [id number]  HPI Virtual Visit via Video Note  I connected with Steve [name] on 05/19/20 at 11:20 AM EST by a video enabled telemedicine application and verified that I am speaking with the correct person using two identifiers.  Location: Patient: home Provider: office  Partiicpats- pt, his wife and myself.   I discussed the limitations of evaluation and management by telemedicine and the availability of in person appointments. The patient expressed understanding and agreed to proceed.  History of Present Illness:  Pt in for follow up from [facility]. Admitted to [facility] after he had collapsed lung.  Admit date: 03/23/2020 Discharge date: 04/17/2020  Time spent: 3mins, more than 50% time spent on coordination of care.   Patient is to discharge to long-term care facility  Recommendations for Outpatient Follow-up after LTAC discharge:  1. F/u with PCP  2. F/u with oncology for h/o lymphoma in 2021 3. F/u with trach clinic    Discharge Condition: stable  Diet recommendation:  Dysphagia 1 pured diet, thin liquid, multiple dry swallows after each bite /sip, seated up straight 90 degrees  Continue aspiration precaution Continue nutrition/meds per tube  Patient may use Passy-Muir valve intermittently with supervision Oral care twice daily   History of present illness: (Per ICU admitting MD Steve [name]) This is a pleasant 65 year old male whom is well-known to the pulmonary and critical care service from his hospitalization in the spring 2021 with Covid pneumonia. At baseline he has lymphoma and had been receiving treatment for the same around the time of his original diagnosis with COVID. He remained ventilator dependent during this hospital stay and [facility] He stayed there for several weeks and then went home on a home  ventilator. According to his sister who provides the history today he has been bedbound since then, though in general his strength and his faculties have returned. He is now able to use a cell phone to communicate with folks, his hair has grown back. She does note that care for him at home has been somewhat [place] and that he has not had his tracheostomy changed in months. Per chart review there was initially some consideration for home hospice however by October 2021 when he was seen in a virtual visit with Steve [name] it was noted that he desired a full scope of care. According to he and his sister he developed increasing chest tightness across his chest approximately 2 days prior to admission. He has had an increase in cough and brown mucus production. This morning he was noted to have thick mucus and when suctioned by his home health nurse the tracheostomy dislodged so he was brought to the [facility] emergency department. Here the tracheostomy was put back into position by the respiratory therapy and emergency physician team. He was noted on chest imaging to have a right-sided pneumothorax. He is currently breathing with the assistance of his home ventilator, his vital signs are stable. 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 -Bronchoscopy on  03/23/2020 and 03/25/2020 for dislodged tracheostomy tube and repositioning of tracheostomy respectively -Patient has been changed to a cuffless size 6,off vent,35% FiO2 at a liter per minute -Passy-Muir valve lost during transition from ICU to the floor,repeat speech study 1/6,tolerated Passy-Muir valve,speech also okayed with dysphagia 1 diet/thin liquid on 1/6,  -PCCM hoping for decannulation once patient decondition and nutrition status improves,Will need trach clinic follow-up with Steve [name] --Per [facility] patient will not qualify for CIR unless with improved tolerance"at most 35% trach collar, no more than 10L with mobility, and maintaining O2 sats >80%" -d/c to LTAC   -Received first Covid vaccine and due for second Covid vaccine after 04/23/2019  Possible left lobar pneumonia , presents on admission Initial sputum culture grew"FEW DIPHTHEROIDS(CORYNEBACTERIUM SPECIES" Received Rocephin x5 days  Pneumothorax -treated with right chest tube initially,Resolved. Per PCCM.  Stenotrophomonas intrach aspirate from 12/22 -Felt likely secondary to colonization. received levaquinx3 days, abxhave been discontinued. Follow.  Well-controlled diabetes mellitus type 2,previously insulin-dependent prior to discharge -Hemoglobin A1c 5.7 (03/23/2020). Continue tube feeds. Sliding scale insulin,has not needed any SSI coverage in the hospital in the last few days  Anxiety -Continue Ativan as needed.   History of lymphoma -Treated in the spring 2021 prior to Covid. Will need outpatient follow-up with oncology.  Severe deconditioning/debility Continue PT OT, LTAC placement   Dysphagiathought secondary to deconditioning,nutrition per PEG tube,started dysphagia diet 1 thin liquidon 1/6,speech continue following  GERDcontinue PPI   granulation tissue at feeding tube site Wound care consulted  And recommendation as below: Wound type:Skin breakdown at  feeding tube site MASD Pressure Injury POA: NA Drainage (amount, consistency, odor)  Periwound: Dressing procedure/placement/frequency: Gently clean the area with NS, pat dry. Wipe the area with 24M skin barrier wipes Steve [name] # [phone]) allow to dry. May place a small piece of Xeroform gauze directly on the granulation tissue and secure with split gauze and medipore tape. Change daily.  Monitor the wound area(s) for worsening of condition such as: Signs/symptoms of infection, increase in size, development of or worsening of odor, development of pain, or increased pain at the affected locations.  Notify the medical team if any of these develop.    Procedures:  Tracheostomy present on prior to admission  Right chest tube placed on 03/23/2020 for pneumothorax  Bronchoscopy on 03/23/2020 and 03/25/2020 for dislodged tracheostomy tube and repositioning of tracheostomy respectively  Pt is now under [facility] and not hospice.   Pt has appointment with tracheostomy clinic on May 12, 2020  at 2 pm. Has virtual visit on pulmonlogist on June 03, 2020.  Reviewed med list. Has rx albuterol nebulizer solution. Asking family to discuss neb machine with pulmonogist NP at clinic.  Wife has put A and D ointment and Calmoseptine.     Observations/Objective: General-no acute distress, pleasant, oriented. Lungs- on inspection lungs appear unlabored. Neck- no tracheal deviation or jvd on inspection. Neuro- gross motor function appears intact.   Assessment and Plan: Follow up with Tracheostomy clincand pulmonologist for ventilator dependant status.  Ask pulmonologist about nebulizer to go along with albuterol solution.  Anxiety- continue ativan 2-3 times a day prn anxiety.  For mild breakdown continue with A & D ointment and Calmoseptine. If mild red area worsens then would approve wound care referral.  For gerd continue protonix 40 mg daily.  Home health is set up. Coming out  today.  Hx of  pneumothorax. Appears clinically resolved. He was also given antibiotics.  Put in referral to oncologist for hx of hodgkins lymphoma.  Follow up 3 months or as needed.  Ask wife to send me list of meds she has to compare to DC summary.  [name], PA-C  Follow Up Instructions:    I discussed the assessment and treatment plan with the patient. The patient was provided an opportunity to ask questions and all were answered. The patient agreed with the plan and demonstrated an understanding of the instructions.   The patient was advised to call back or seek an in-person evaluation if the symptoms worsen or if the condition fails to improve as anticipated.  Time spent with patient today was  35 minutes which consisted of chart revdiew, discussing diagnosis, work up treatment and documentation.  [name], PA-C    Review of Systems     Objective:   Physical Exam        Assessment & Plan:

## 2020-05-19 NOTE — Patient Instructions (Addendum)
Follow up with Tracheostomy clincand pulmonologist for ventilator dependant status.  Ask pulmonologist about nebulizer to go along with albuterol solution.  Anxiety- continue ativan 2-3 times a day prn anxiety.  For mild breakdown continue with A & D ointment and Calmoseptine. If mild red area worsens then would approve wound care referral.   For gerd continue protonix 40 mg daily.  Home health is set up. Coming out today.  Hx of  pneumothorax. Appears clinically resolved. He was also given antibiotics.  Put in referral to oncologist for hx of hodgkins lymphoma.  Follow up 3 months or as needed.  Ask wife to send me list of meds she has to compare to DC summary.

## 2020-05-20 ENCOUNTER — Encounter: Payer: Self-pay | Admitting: *Deleted

## 2020-05-20 NOTE — Progress Notes (Signed)
Received a re-referral for this patient to follow up on this patient's Hodgkin's Lymphoma. Patient is s/p covid infection which left him vent dependant. Patient will need virtual appointments.   He is a former patient of Dr Lorette Ang who has since left the practice, and Dr Marin Olp doesn't have a virtual appointment option. For patient need, as well as his current residence being closer to [facility], I reached out to Dr Irene Limbo to see if he would be willing to accept patient. Unfortunately he only has phone appointments and doesn't believe this patient would be best served by this. I followed up with two additional MD's   Called the wife to get most recent update on patient. She states patient is now at home - discharged less than 48h ago. He's off the vent but still oxygen dependant and significant care. She states it takes a team to get the patient to any appointments. She worries about the patient's lymphoma, as she states he had severe itching prior to his diagnosis, and she's noticing him itching more in recent days. She doesn't have a preference as to which office sees the patient.   Spoke again to Dr Marin Olp and we will keep patient in this office. Wife called and given appointment information.   Oncology Nurse Navigator Documentation  Oncology Nurse Navigator Flowsheets 05/20/2020  Abnormal Finding Date -  Confirmed Diagnosis Date -  Diagnosis Status -  Planned Course of Treatment -  Phase of Treatment -  Chemotherapy Pending- Reason: -  Chemotherapy Actual Start Date: -  Navigator Follow Up Date: 06/01/2020  Navigator Follow Up Reason: New Patient Appointment  Navigator Location [facility]  Referral Date to RadOnc/MedOnc -  Navigator Encounter Type Other:;Appt/Treatment Plan Review;Telephone  Telephone Outgoing Call;Appt Confirmation/Clarification  Treatment Initiated Date -  Patient Visit Type MedOnc  Treatment Phase Post-Tx Follow-up  Barriers/Navigation Needs Coordination of Care   Education -  Interventions Coordination of Care;Referrals  Acuity Level 2-Minimal Needs (1-2 Barriers Identified)  Referrals Other  Coordination of Care Other  Education Method -  Support Groups/Services Friends and Family  Time Spent with Patient 70

## 2020-05-21 ENCOUNTER — Telehealth: Payer: BC Managed Care – PPO | Admitting: Medical

## 2020-05-21 ENCOUNTER — Encounter: Payer: Self-pay | Admitting: *Deleted

## 2020-05-22 ENCOUNTER — Inpatient Hospital Stay (HOSPITAL_COMMUNITY): Admission: RE | Admit: 2020-05-22 | Payer: BC Managed Care – PPO | Source: Ambulatory Visit

## 2020-05-22 ENCOUNTER — Telehealth: Payer: Self-pay | Admitting: Emergency Medicine

## 2020-05-22 NOTE — Telephone Encounter (Signed)
LMTCB with patient so that we can schedule a tele visit with [place] or App.   Will hold in triage.

## 2020-05-22 NOTE — Telephone Encounter (Signed)
I need to know the details of the hospitalization. May need a tele visit with either me or an APP to make sure he is vent free. If so then we will d/c the vent

## 2020-05-22 NOTE — Telephone Encounter (Signed)
Noted. Will route message to Dr [name] to be sure he is in agreement to D/C vent.   RB Please advise.   Thanks

## 2020-05-25 ENCOUNTER — Emergency Department (HOSPITAL_COMMUNITY): Payer: BC Managed Care – PPO

## 2020-05-25 ENCOUNTER — Inpatient Hospital Stay (HOSPITAL_COMMUNITY)
Admission: EM | Admit: 2020-05-25 | Discharge: 2020-05-29 | DRG: 189 | Disposition: A | Discharge status: Home Health | Payer: BC Managed Care – PPO | Attending: Family Medicine | Admitting: Family Medicine

## 2020-05-25 ENCOUNTER — Other Ambulatory Visit: Payer: Self-pay

## 2020-05-25 ENCOUNTER — Encounter (HOSPITAL_COMMUNITY): Payer: Self-pay | Admitting: Emergency Medicine

## 2020-05-25 DIAGNOSIS — J471 Bronchiectasis with (acute) exacerbation: Secondary | ICD-10-CM | POA: Diagnosis present

## 2020-05-25 DIAGNOSIS — Z79899 Other long term (current) drug therapy: Secondary | ICD-10-CM

## 2020-05-25 DIAGNOSIS — J8410 Pulmonary fibrosis, unspecified: Secondary | ICD-10-CM | POA: Diagnosis present

## 2020-05-25 DIAGNOSIS — K219 Gastro-esophageal reflux disease without esophagitis: Secondary | ICD-10-CM | POA: Diagnosis present

## 2020-05-25 DIAGNOSIS — I10 Essential (primary) hypertension: Secondary | ICD-10-CM | POA: Diagnosis not present

## 2020-05-25 DIAGNOSIS — Z20822 Contact with and (suspected) exposure to covid-19: Secondary | ICD-10-CM | POA: Diagnosis present

## 2020-05-25 DIAGNOSIS — Z930 Tracheostomy status: Secondary | ICD-10-CM

## 2020-05-25 DIAGNOSIS — Z794 Long term (current) use of insulin: Secondary | ICD-10-CM

## 2020-05-25 DIAGNOSIS — Z9221 Personal history of antineoplastic chemotherapy: Secondary | ICD-10-CM

## 2020-05-25 DIAGNOSIS — J411 Mucopurulent chronic bronchitis: Secondary | ICD-10-CM | POA: Diagnosis present

## 2020-05-25 DIAGNOSIS — Z96642 Presence of left artificial hip joint: Secondary | ICD-10-CM | POA: Diagnosis present

## 2020-05-25 DIAGNOSIS — J9621 Acute and chronic respiratory failure with hypoxia: Principal | ICD-10-CM | POA: Diagnosis present

## 2020-05-25 DIAGNOSIS — M21371 Foot drop, right foot: Secondary | ICD-10-CM | POA: Diagnosis present

## 2020-05-25 DIAGNOSIS — R0602 Shortness of breath: Secondary | ICD-10-CM | POA: Diagnosis not present

## 2020-05-25 DIAGNOSIS — C8190 Hodgkin lymphoma, unspecified, unspecified site: Secondary | ICD-10-CM | POA: Diagnosis present

## 2020-05-25 DIAGNOSIS — J9601 Acute respiratory failure with hypoxia: Secondary | ICD-10-CM

## 2020-05-25 DIAGNOSIS — J9602 Acute respiratory failure with hypercapnia: Secondary | ICD-10-CM | POA: Diagnosis present

## 2020-05-25 DIAGNOSIS — C8110 Nodular sclerosis classical Hodgkin lymphoma, unspecified site: Secondary | ICD-10-CM | POA: Diagnosis present

## 2020-05-25 DIAGNOSIS — F419 Anxiety disorder, unspecified: Secondary | ICD-10-CM | POA: Diagnosis present

## 2020-05-25 DIAGNOSIS — E1165 Type 2 diabetes mellitus with hyperglycemia: Secondary | ICD-10-CM

## 2020-05-25 DIAGNOSIS — G4730 Sleep apnea, unspecified: Secondary | ICD-10-CM | POA: Diagnosis present

## 2020-05-25 DIAGNOSIS — J479 Bronchiectasis, uncomplicated: Secondary | ICD-10-CM | POA: Diagnosis not present

## 2020-05-25 DIAGNOSIS — Z23 Encounter for immunization: Secondary | ICD-10-CM

## 2020-05-25 DIAGNOSIS — Z8249 Family history of ischemic heart disease and other diseases of the circulatory system: Secondary | ICD-10-CM

## 2020-05-25 DIAGNOSIS — R1310 Dysphagia, unspecified: Secondary | ICD-10-CM | POA: Diagnosis present

## 2020-05-25 DIAGNOSIS — M21372 Foot drop, left foot: Secondary | ICD-10-CM | POA: Diagnosis present

## 2020-05-25 DIAGNOSIS — J470 Bronchiectasis with acute lower respiratory infection: Secondary | ICD-10-CM | POA: Diagnosis present

## 2020-05-25 DIAGNOSIS — Z8616 Personal history of COVID-19: Secondary | ICD-10-CM

## 2020-05-25 DIAGNOSIS — Z931 Gastrostomy status: Secondary | ICD-10-CM

## 2020-05-25 DIAGNOSIS — D649 Anemia, unspecified: Secondary | ICD-10-CM | POA: Diagnosis present

## 2020-05-25 DIAGNOSIS — B965 Pseudomonas (aeruginosa) (mallei) (pseudomallei) as the cause of diseases classified elsewhere: Secondary | ICD-10-CM | POA: Diagnosis present

## 2020-05-25 LAB — CBC WITH DIFFERENTIAL/PLATELET
Abs Immature Granulocytes: 0.08 10*3/uL — ABNORMAL HIGH (ref 0.00–0.07)
Basophils Absolute: 0 10*3/uL (ref 0.0–0.1)
Basophils Relative: 0 %
Eosinophils Absolute: 0.7 10*3/uL — ABNORMAL HIGH (ref 0.0–0.5)
Eosinophils Relative: 5 %
HCT: 35.4 % — ABNORMAL LOW (ref 39.0–52.0)
Hemoglobin: 11 g/dL — ABNORMAL LOW (ref 13.0–17.0)
Immature Granulocytes: 1 %
Lymphocytes Relative: 11 %
Lymphs Abs: 1.5 10*3/uL (ref 0.7–4.0)
MCH: 27.9 pg (ref 26.0–34.0)
MCHC: 31.1 g/dL (ref 30.0–36.0)
MCV: 89.8 fL (ref 80.0–100.0)
Monocytes Absolute: 1.2 10*3/uL — ABNORMAL HIGH (ref 0.1–1.0)
Monocytes Relative: 9 %
Neutro Abs: 10.7 10*3/uL — ABNORMAL HIGH (ref 1.7–7.7)
Neutrophils Relative %: 74 %
Platelets: 228 10*3/uL (ref 150–400)
RBC: 3.94 MIL/uL — ABNORMAL LOW (ref 4.22–5.81)
RDW: 14.6 % (ref 11.5–15.5)
WBC: 14.3 10*3/uL — ABNORMAL HIGH (ref 4.0–10.5)
nRBC: 0 % (ref 0.0–0.2)

## 2020-05-25 LAB — BLOOD GAS, ARTERIAL
Acid-Base Excess: 4.9 mmol/L — ABNORMAL HIGH (ref 0.0–2.0)
Bicarbonate: 31.3 mmol/L — ABNORMAL HIGH (ref 20.0–28.0)
Drawn by: 23281
FIO2: 60
O2 Saturation: 98.3 %
Patient temperature: 99
pCO2 arterial: 59.1 mmHg — ABNORMAL HIGH (ref 32.0–48.0)
pH, Arterial: 7.343 — ABNORMAL LOW (ref 7.350–7.450)
pO2, Arterial: 133 mmHg — ABNORMAL HIGH (ref 83.0–108.0)

## 2020-05-25 LAB — PROCALCITONIN: Procalcitonin: <0.1 ng/mL

## 2020-05-25 LAB — COMPREHENSIVE METABOLIC PANEL
ALT: 25 U/L (ref 0–44)
AST: 23 U/L (ref 15–41)
Albumin: 3 g/dL — ABNORMAL LOW (ref 3.5–5.0)
Alkaline Phosphatase: 61 U/L (ref 38–126)
Anion gap: 10 (ref 5–15)
BUN: 29 mg/dL — ABNORMAL HIGH (ref 8–23)
CO2: 33 mmol/L — ABNORMAL HIGH (ref 22–32)
Calcium: 9.5 mg/dL (ref 8.9–10.3)
Chloride: 95 mmol/L — ABNORMAL LOW (ref 98–111)
Creatinine, Ser: 0.53 mg/dL — ABNORMAL LOW (ref 0.61–1.24)
GFR, Estimated: >60 mL/min (ref >60)
Glucose, Bld: 122 mg/dL — ABNORMAL HIGH (ref 70–99)
Potassium: 4.5 mmol/L (ref 3.5–5.1)
Sodium: 138 mmol/L (ref 135–145)
Total Bilirubin: 0.5 mg/dL (ref 0.3–1.2)
Total Protein: 7.9 g/dL (ref 6.5–8.1)

## 2020-05-25 LAB — RESP PANEL BY RT-PCR (FLU A&B, COVID) ARPGX2
Influenza A by PCR: NEGATIVE
Influenza B by PCR: NEGATIVE
SARS Coronavirus 2 by RT PCR: NEGATIVE

## 2020-05-25 LAB — LACTIC ACID, PLASMA
Lactic Acid, Venous: 0.6 mmol/L (ref 0.5–1.9)
Lactic Acid, Venous: 0.6 mmol/L (ref 0.5–1.9)

## 2020-05-25 LAB — C-REACTIVE PROTEIN: CRP: 3 mg/dL — ABNORMAL HIGH

## 2020-05-25 LAB — D-DIMER, QUANTITATIVE: D-Dimer, Quant: 6.05 ug/mL-FEU — ABNORMAL HIGH (ref 0.00–0.50)

## 2020-05-25 MED ORDER — SODIUM CHLORIDE 0.9 % IV SOLN
2.0000 g | Freq: Once | INTRAVENOUS | Status: AC
  Administered 2020-05-25: 2 g via INTRAVENOUS
  Filled 2020-05-25: qty 2

## 2020-05-25 MED ORDER — IOHEXOL 350 MG/ML SOLN
100.0000 mL | Freq: Once | INTRAVENOUS | Status: AC | PRN
  Administered 2020-05-25: 80 mL via INTRAVENOUS

## 2020-05-25 MED ORDER — LACTATED RINGERS IV SOLN: INTRAVENOUS | Status: AC

## 2020-05-25 MED ORDER — ENOXAPARIN SODIUM 40 MG/0.4ML ~~LOC~~ SOLN
40.0000 mg | SUBCUTANEOUS | Status: DC
  Administered 2020-05-26 – 2020-05-29 (×4): 40 mg via SUBCUTANEOUS
  Filled 2020-05-25 (×4): qty 0.4

## 2020-05-25 MED ORDER — ONDANSETRON HCL 4 MG/2ML IJ SOLN: 4.0000 mg | Freq: Four times a day (QID) | INTRAMUSCULAR | Status: DC | PRN

## 2020-05-25 MED ORDER — ONDANSETRON HCL 4 MG PO TABS: 4.0000 mg | ORAL_TABLET | Freq: Four times a day (QID) | ORAL | Status: DC | PRN

## 2020-05-25 MED ORDER — OMEPRAZOLE 2 MG/ML ORAL SUSPENSION: 40.0000 mg | Freq: Every day | ORAL | Status: DC

## 2020-05-25 MED ORDER — INFLUENZA VAC SPLIT QUAD 0.5 ML IM SUSY
0.5000 mL | PREFILLED_SYRINGE | INTRAMUSCULAR | Status: AC
  Administered 2020-05-27: 0.5 mL via INTRAMUSCULAR
  Filled 2020-05-25: qty 0.5

## 2020-05-25 MED ORDER — SERTRALINE HCL 50 MG PO TABS
50.0000 mg | ORAL_TABLET | Freq: Every day | ORAL | Status: DC
  Administered 2020-05-26 – 2020-05-29 (×4): 50 mg
  Filled 2020-05-25 (×4): qty 1

## 2020-05-25 MED ORDER — SODIUM CHLORIDE 0.9 % IV BOLUS
1000.0000 mL | Freq: Once | INTRAVENOUS | Status: AC
  Administered 2020-05-25: 1000 mL via INTRAVENOUS

## 2020-05-25 MED ORDER — SODIUM CHLORIDE 0.9 % IV SOLN
1.0000 g | Freq: Once | INTRAVENOUS | Status: DC
  Filled 2020-05-25: qty 1

## 2020-05-25 MED ORDER — LORAZEPAM 0.5 MG PO TABS: 0.5000 mg | ORAL_TABLET | ORAL | Status: DC | PRN

## 2020-05-25 MED ORDER — FREE WATER
100.0000 mL | Status: DC
  Administered 2020-05-26 – 2020-05-29 (×23): 100 mL

## 2020-05-25 MED ORDER — ACETAMINOPHEN 650 MG RE SUPP: 650.0000 mg | Freq: Four times a day (QID) | RECTAL | Status: DC | PRN

## 2020-05-25 MED ORDER — POLYETHYLENE GLYCOL 3350 17 G PO PACK: 17.0000 g | PACK | Freq: Every day | ORAL | Status: DC | PRN

## 2020-05-25 MED ORDER — INSULIN ASPART 100 UNIT/ML ~~LOC~~ SOLN
0.0000 [IU] | Freq: Three times a day (TID) | SUBCUTANEOUS | Status: DC
  Filled 2020-05-25: qty 0.15

## 2020-05-25 MED ORDER — IPRATROPIUM-ALBUTEROL 0.5-2.5 (3) MG/3ML IN SOLN: 3.0000 mL | RESPIRATORY_TRACT | Status: DC | PRN

## 2020-05-25 MED ORDER — CHLORHEXIDINE GLUCONATE 0.12% ORAL RINSE (MEDLINE KIT)
15.0000 mL | Freq: Two times a day (BID) | OROMUCOSAL | Status: DC
  Administered 2020-05-26 – 2020-05-29 (×5): 15 mL via OROMUCOSAL
  Filled 2020-05-25 (×2): qty 15

## 2020-05-25 MED ORDER — ACETAMINOPHEN 325 MG PO TABS: 650.0000 mg | ORAL_TABLET | Freq: Four times a day (QID) | ORAL | Status: DC | PRN

## 2020-05-25 MED ORDER — INSULIN ASPART 100 UNIT/ML ~~LOC~~ SOLN
0.0000 [IU] | Freq: Four times a day (QID) | SUBCUTANEOUS | Status: DC
  Administered 2020-05-27: 2 [IU] via SUBCUTANEOUS
  Administered 2020-05-27: 3 [IU] via SUBCUTANEOUS
  Administered 2020-05-27 – 2020-05-29 (×6): 2 [IU] via SUBCUTANEOUS
  Administered 2020-05-29: 5 [IU] via SUBCUTANEOUS
  Filled 2020-05-25: qty 0.15

## 2020-05-25 MED ORDER — PNEUMOCOCCAL VAC POLYVALENT 25 MCG/0.5ML IJ INJ
0.5000 mL | INJECTION | INTRAMUSCULAR | Status: AC
  Administered 2020-05-27: 0.5 mL via INTRAMUSCULAR
  Filled 2020-05-25: qty 0.5

## 2020-05-25 MED ORDER — VANCOMYCIN HCL 1500 MG/300ML IV SOLN
1500.0000 mg | Freq: Once | INTRAVENOUS | Status: AC
  Administered 2020-05-25: 1500 mg via INTRAVENOUS
  Filled 2020-05-25: qty 300

## 2020-05-25 MED ORDER — PANTOPRAZOLE SODIUM 40 MG PO TBEC: 40.0000 mg | DELAYED_RELEASE_TABLET | Freq: Every day | ORAL | Status: DC

## 2020-05-25 MED ORDER — FERROUS SULFATE 300 (60 FE) MG/5ML PO SYRP
300.0000 mg | ORAL_SOLUTION | Freq: Every day | ORAL | Status: DC
  Administered 2020-05-26 – 2020-05-29 (×4): 300 mg
  Filled 2020-05-25 (×5): qty 5

## 2020-05-25 MED ORDER — FUROSEMIDE 40 MG PO TABS: 20.0000 mg | ORAL_TABLET | Freq: Every day | ORAL | Status: DC

## 2020-05-25 NOTE — ED Notes (Signed)
Respiratory at bedside to suction trach

## 2020-05-25 NOTE — ED Triage Notes (Signed)
Arrives via EMS from home, C/C SOB x 1 days, upon arrival pt was 60% on his home 10L. EMS got thick secretions when they suctioned, pt had also suctioned himself extensively at home. Recent hx of PNA.

## 2020-05-25 NOTE — ED Provider Notes (Signed)
[facility] DEPT Provider Note   CSN: [id number] Arrival date & time: 05/25/20  1706     History No chief complaint on file.   Steve [name] is a 65 y.o. male.  The history is provided by the patient, the EMS personnel and medical records.   Steve [name] is a 65 y.o. male who presents to the Emergency Department complaining of shortness of breath. He presents the emergency department by EMS for evaluation of shortness of breath. He has been home for the last week following prolonged hospitalization and transfer to [facility] for respiratory failure, ARDS, trach placement, Peg tube placement. He was doing well at home when he developed worsening shortness of breath earlier today. He has significant thick secretions. On EMS arrival he was setting 60% on his home oxygen, 10 L by trach collar. He was suctioned aggressively and sats improved to 90%. He reports feeling improved on ED presentation. He lives at home with a caregiver. Level V caveat due to communication barriers.    Past Medical History:  Diagnosis Date  . Abscess of muscle 08/10/2011   staph infection of right hip   . Acute on chronic respiratory failure with hypoxia ([place])   . Acute respiratory distress syndrome (ARDS) due to COVID-19 virus ([place])   . Anxiety   . COVID-19 virus infection   . Diabetes mellitus without complication ([place])   . GERD (gastroesophageal reflux disease)    rare reflux - no meds for reflux - NO PROBLEM IN PAST SEVERAL YRS  . HCAP (healthcare-associated pneumonia)   . Healthcare-associated pneumonia   . Hip dysplasia, congenital    no surgery as a child for hip dysplasia - has had bilateral hip replacements as an adult  . Hodgkin lymphoma ([place])   . Hodgkin's lymphoma ([place])   . Hypertension   . Pancreatitis   . Pneumothorax, acute   . Postoperative anemia due to acute blood loss 09/07/2012  . Septic arthritis of hip ([place]) 09/05/2012   PT'S TOTAL HIP JOINT  REMOVED - ANTIBIOTIC SPACE PLACED AND PT HAS FINISHED IV ANTIBIOTICS ( PICC LINE REMOVED)  . Sleep apnea    USES CPAP    Patient Active Problem List   Diagnosis Date Noted  . Debility   . Anxiety   . Tracheostomy care ([place])   . Pneumothorax on right 03/23/2020  . Chronic respiratory failure with hypoxia ([place])   . ARDS (adult respiratory distress syndrome) ([place])   . Hodgkin's lymphoma ([place])   . Pneumothorax, acute   . Healthcare-associated pneumonia   . COVID-19 virus infection   . On mechanically assisted ventilation ([place])   . Dysphagia   . Pneumothorax   . Subcutaneous air ([place])   . Status post tracheostomy ([place])   . Pneumomediastinum ([place])   . Acute respiratory distress syndrome (ARDS) due to COVID-19 virus ([place])   . Pressure injury of skin 07/27/2019  . Acute respiratory failure with hypoxemia ([place])   . 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 Malnutrition of moderate degree 07/10/2019  . Acute on chronic respiratory failure ([place]) 07/10/2019  . Diabetes mellitus ([place]) 07/10/2019  . HCAP (healthcare-associated pneumonia)   . Hypoxia   . Febrile neutropenia ([place]) 05/17/2019  . COVID-19 05/17/2019  . Anemia 05/17/2019  . Hyponatremia 05/17/2019  . Protein-calorie malnutrition, severe ([place]) 05/17/2019  . Neutropenic fever ([place]) 05/16/2019  . 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 Chemotherapy-induced diarrhea 04/22/2019  . Anemia due  to antineoplastic chemotherapy 03/25/2019  . Hodgkin's lymphoma ([place]) 02/25/2019  . Abnormal CT scan, pancreas - tail area 01/31/2013  . 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 Septic arthritis of hip ([place]) 09/05/2012  . Tick bite of abdomen 11/09/2011  . Routine general medical examination at a health care facility 09/12/2011  . Elbow pain 09/12/2011  . S/P PICC central line placement 08/10/2011  . Abscess of muscle 08/10/2011  . H/O dental abscess 08/10/2011  . Staphylococcus aureus bacteremia  07/27/2011  . Elevated BP 09/07/2010  . Abdominal pain, RLQ 09/02/2010  . HYPOGONADISM, MALE 03/23/2007  . IMPAIRED GLUCOSE TOLERANCE 03/23/2007  . SLEEP APNEA 03/23/2007    Past Surgical History:  Procedure Laterality Date  . COLONOSCOPY  04/26/2007  . HERNIA REPAIR     inguinal hernia x3  . IR GASTROSTOMY TUBE MOD SED  08/22/2019  . IR IMAGING GUIDED PORT INSERTION  03/29/2019  . JOINT REPLACEMENT  2002 & 2007   bilateral hip replacement  . LYMPH NODE BIOPSY Right 03/20/2019   Procedure: DEEP RIGHT SUPRACLAVICULAR LYMPH NODE EXCISION;  Surgeon: Fanny Skates, MD;  Location: [facility];  Service: General;  Laterality: Right;  . MULTIPLE EXTRACTIONS WITH ALVEOLOPLASTY  07/28/2011   Procedure: MULTIPLE EXTRACION WITH ALVEOLOPLASTY;  Surgeon: Lenn Cal, DDS;  Location: [facility];  Service: Oral Surgery;  Laterality: N/A;  Extraction of tooth #'s 2,3,4,5,6,11,12,13,15,19,22 with alveoloplasty.  . shoulder repair - right for separation of shoulder    . TEE WITHOUT CARDIOVERSION  07/29/2011   Procedure: TRANSESOPHAGEAL ECHOCARDIOGRAM (TEE);  Surgeon: Josue Hector, MD;  Location: [facility];  Service: Cardiovascular;  Laterality: N/A;  . TOTAL HIP REVISION Right 09/05/2012   Procedure: RIGHT HIP RESECTION ARTHROPLASTY WITH ANTIBIOTIC SPACERS;  Surgeon: [name], MD;  Location: [facility];  Service: Orthopedics;  Laterality: Right;  . TOTAL HIP REVISION Right 11/30/2012   Procedure: RIGHT TOTAL HIP ARTHROPLASTY REIMPLANTATION;  Surgeon: [name], MD;  Location: [facility];  Service: Orthopedics;  Laterality: Right;       Family History  Problem Relation Age of Onset  . Melanoma Mother   . Heart attack Mother   . Hyperlipidemia Neg Hx   . Sudden death Neg Hx   . Hypertension Neg Hx   . Diabetes Neg Hx     Social History   Tobacco Use  . Smoking status: Never Smoker  . Smokeless tobacco: Never Used  Vaping Use  . Vaping Use: Never used  Substance Use  Topics  . Alcohol use: Yes    Comment: rare beer. 5 times a year at most. 2 beers when he drinks.  . Drug use: No    Home Medications Prior to Admission medications   Medication Sig Start Date End Date Taking? Authorizing Provider  albuterol (PROVENTIL) (2.5 MG/3ML) 0.083% nebulizer solution Take 3 mLs (2.5 mg total) by nebulization every 2 (two) hours as needed for wheezing. 04/17/20  Yes Florencia Reasons, MD  Amino Acids-Protein Hydrolys (FEEDING SUPPLEMENT, PRO-STAT SUGAR FREE 64,) LIQD Place 30 mLs into feeding tube daily.   Yes [provider]  chlorhexidine (PERIDEX) 0.12 % solution 15 mLs by Mouth Rinse route 2 (two) times daily. 04/17/20  Yes Florencia Reasons, MD  Cholecalciferol (VITAMIN D3) 10 MCG/ML LIQD Give 400 Units by tube daily. Patient taking differently: Place 400 Units into feeding tube daily. 04/17/20  Yes Florencia Reasons, MD  enoxaparin (LOVENOX) 40 MG/0.4ML injection Inject 0.4 mLs (40 mg total) into the skin  daily. 04/17/20  Yes Florencia Reasons, MD  ferrous sulfate 300 (60 Fe) MG/5ML syrup Place 5 mLs (300 mg total) into feeding tube daily with breakfast. 08/28/19  Yes Ollis, Brandi L, NP  furosemide (LASIX) 20 MG tablet Place 20 mg into feeding tube daily.   Yes [provider]  Insulin Aspart, w/Niacinamide, (FIASP) 100 UNIT/ML SOLN Inject 5 Units into the skin 3 (three) times daily.   Yes [provider]  lansoprazole (PREVACID) 30 MG capsule Place 30 mg into feeding tube daily at 12 noon.   Yes [provider]  LORazepam (ATIVAN) 0.5 MG tablet Place 1 tablet (0.5 mg total) into feeding tube every 4 (four) hours as needed for anxiety. 04/17/20  Yes Florencia Reasons, MD  Multiple Vitamin (MULTIVITAMIN) LIQD Place 15 mLs into feeding tube daily. Patient taking differently: No sig reported 08/28/19  Yes Ollis, Brandi L, NP  Nutritional Supplements (FEEDING SUPPLEMENT, OSMOLITE 1.2 CAL,) LIQD Place 1,000 mLs into feeding tube continuous. 04/17/20  Yes Florencia Reasons, MD  sertraline (ZOLOFT) 50  MG tablet Place 1 tablet (50 mg total) into feeding tube daily. 08/28/19  Yes Ollis, Brandi L, NP  triamcinolone (KENALOG) 0.1 % Apply 1 application topically 2 (two) times daily as needed (itching).   Yes [provider]  Water For Irrigation, Sterile (FREE WATER) SOLN Place 100 mLs into feeding tube every 4 (four) hours. 04/17/20  Yes Florencia Reasons, MD  acetaminophen (TYLENOL) 160 MG/5ML solution Place 20.3 mLs (650 mg total) into feeding tube every 4 (four) hours as needed for mild pain or fever (temp > 101.5). Patient not taking: Reported on 05/25/2020 08/27/19   Donita Brooks, NP  Chlorhexidine Gluconate Cloth 2 % PADS Apply 6 each topically daily. Patient not taking: No sig reported 04/18/20   Florencia Reasons, MD  insulin aspart (NOVOLOG) 100 UNIT/ML injection Inject 0-15 Units into the skin 3 (three) times daily with meals. Patient not taking: No sig reported 04/17/20   Florencia Reasons, MD  Mouthwashes (MOUTH RINSE) LIQD solution 15 mLs by Mouth Rinse route 2 times daily at 12 noon and 4 pm. Patient not taking: No sig reported 04/17/20   Florencia Reasons, MD  Nutritional Supplements (FEEDING SUPPLEMENT, PROSOURCE TF,) liquid Place 45 mLs into feeding tube 3 (three) times daily. Patient not taking: No sig reported 04/17/20   Florencia Reasons, MD  pantoprazole sodium (PROTONIX) 40 mg/20 mL PACK Place 20 mLs (40 mg total) into feeding tube daily. Patient not taking: No sig reported 04/18/20   Florencia Reasons, MD  traZODone (DESYREL) 100 MG tablet Place 100 mg into feeding tube at bedtime.    [provider]    Allergies    Patient has no known allergies.  Review of Systems   Review of Systems  All other systems reviewed and are negative.   Physical Exam Updated Vital Signs BP 101/73   Pulse (!) 102   Temp 98.9 F (37.2 C) (Oral)   Resp (!) 34   Ht 5\' 5"  (1.651 m)   Wt 62 kg   SpO2 97%   BMI 22.75 kg/m   Physical Exam Vitals and nursing note reviewed.  Constitutional:      Appearance: He is well-developed  and well-nourished.  HENT:     Head: Normocephalic and atraumatic.  Cardiovascular:     Rate and Rhythm: Regular rhythm. Tachycardia present.     Heart sounds: No murmur heard.   Pulmonary:     Comments: Tachypnea, diffuse rhonchi. Tracheostomy in  place. Abdominal:     Palpations: Abdomen is soft.     Tenderness: There is no abdominal tenderness. There is no guarding or rebound.  Musculoskeletal:        General: No swelling, tenderness or edema.     Comments: 2+ DP pulses bilaterally  Skin:    General: Skin is warm and dry.  Neurological:     Mental Status: He is alert and oriented to person, place, and time.  Psychiatric:        Mood and Affect: Mood and affect normal.        Behavior: Behavior normal.     ED Results / Procedures / Treatments   Labs (all labs ordered are listed, but only abnormal results are displayed) Labs Reviewed  COMPREHENSIVE METABOLIC PANEL - Abnormal; Notable for the following components:      Result Value   Chloride 95 (*)    CO2 33 (*)    Glucose, Bld 122 (*)    BUN 29 (*)    Creatinine, Ser 0.53 (*)    Albumin 3.0 (*)    All other components within normal limits  CBC WITH DIFFERENTIAL/PLATELET - Abnormal; Notable for the following components:   WBC 14.3 (*)    RBC 3.94 (*)    Hemoglobin 11.0 (*)    HCT 35.4 (*)    Neutro Abs 10.7 (*)    Monocytes Absolute 1.2 (*)    Eosinophils Absolute 0.7 (*)    Abs Immature Granulocytes 0.08 (*)    All other components within normal limits  D-DIMER, QUANTITATIVE (NOT AT [facility]) - Abnormal; Notable for the following components:   D-Dimer, Quant 6.05 (*)    All other components within normal limits  CULTURE, BLOOD (ROUTINE X 2)  CULTURE, BLOOD (ROUTINE X 2)  EXPECTORATED SPUTUM ASSESSMENT W GRAM STAIN, RFLX TO RESP C  RESP PANEL BY RT-PCR (FLU A&B, COVID) [id number]  LACTIC ACID, PLASMA  LACTIC ACID, PLASMA    EKG EKG Interpretation  Date/Time:  Monday May 25 2020 18:05:45 EST Ventricular  Rate:  103 PR Interval:    QRS Duration: 89 QT Interval:  315 QTC Calculation: 413 R Axis:   -30 Text Interpretation: Sinus tachycardia Left axis deviation Confirmed by Quintella Reichert [phone]) on 05/25/2020 6:08:51 PM   Radiology CT Angio Chest PE W/Cm &/Or Wo Cm  Result Date: 05/25/2020 CLINICAL DATA:  PE suspected, low/intermediate prob, positive D-dimer Shortness of breath with decreased oxygen saturation. EXAM: CT ANGIOGRAPHY CHEST WITH CONTRAST TECHNIQUE: Multidetector CT imaging of the chest was performed using the standard protocol during bolus administration of intravenous contrast. Multiplanar CT image reconstructions and MIPs were obtained to evaluate the vascular anatomy. CONTRAST:  5mL OMNIPAQUE IOHEXOL 350 MG/ML SOLN COMPARISON:  Radiograph earlier today. Chest CT 03/24/2020. PET CT 06/14/2019 FINDINGS: Cardiovascular: There are no filling defects within the pulmonary arteries to suggest pulmonary embolus. Subsegmental branches are not well assessed due to breathing motion artifact. Dilatation of the main pulmonary artery at 3.5 cm. Mild aortic atherosclerosis. No acute aortic findings. Left chest port with tip at the atrial caval junction. Mild cardiomegaly. No pericardial effusion. Mediastinum/Nodes: Extensive mediastinal adenopathy that has progressed from prior exam. Representative node or nodal conglomerate in the right lower paratracheal station measures 3.9 x 4.0 cm, previously 3.2 x 3.6 cm. High right mediastinal/low supraclavicular node measures 2.3 cm short axis, previously 1.7 cm by my retrospective measurement. Progressive soft tissue density presumed nodal tissue in the right anterior mediastinum anterior to the aorta  and IVC. Enlarged right internal mammary node adjacent to the lower sternum measures 2.1 cm. No axillary adenopathy. Tracheostomy with tip above the carina. Minimal retained mucus in the trachea. No esophageal wall thickening. No visualized thyroid nodule.  Lungs/Pleura: Extensive chronic lung disease with multifocal bronchiectasis, peripheral honeycombing, and diffuse ground-glass opacities throughout the lung parenchyma. Calcified granuloma in the left lower lobe. No evidence of acute airspace disease. No significant pleural effusion. Previous pigtail catheter in the right anterior chest has been removed. No pneumothorax. Upper Abdomen: Small retrosternal lymph node measures 6 mm, series 4, image 99. Limited imaging of the upper abdomen without other acute finding. Musculoskeletal: No acute osseous abnormality or focal bone lesion. Thoracic spine spondylosis. Review of the MIP images confirms the above findings. IMPRESSION: 1. No pulmonary embolus. Dilatation of the main pulmonary artery consistent with pulmonary arterial hypertension. 2. Progressive thoracic adenopathy. This is greater than typically seen with reactive adenopathy, and suspicious for Hodgkin's lymphoma recurrence. 3. Extensive interstitial lung disease with pulmonary fibrosis and bronchiectasis. No acute pulmonary findings. Aortic Atherosclerosis (ICD10-I70.0). Electronically Signed   By: Keith Rake M.D.   On: 05/25/2020 20:44   DG Chest Port 1 View  Result Date: 05/25/2020 CLINICAL DATA:  Shortness of breath. EXAM: PORTABLE CHEST 1 VIEW COMPARISON:  Most recent radiograph 04/07/2020 FINDINGS: Tracheostomy tube tip at the thoracic inlet. Left chest port remains in place. Chronic elevation of right hemidiaphragm. Interstitial lung disease with peripheral predominant reticulation, chronic and unchanged. No new airspace disease. Stable cardiomegaly and mediastinal contours. No pneumothorax. IMPRESSION: 1. Stable chronic interstitial lung disease. No new abnormalities. 2. Stable cardiomegaly. 3. Tracheostomy tube unchanged with tip at the thoracic inlet. Electronically Signed   By: Keith Rake M.D.   On: 05/25/2020 18:56    Procedures Procedures   Medications Ordered in  ED Medications  vancomycin (VANCOREADY) IVPB 1500 mg/300 mL (1,500 mg Intravenous New Bag/Given 05/25/20 2059)  sodium chloride 0.9 % bolus 1,000 mL (1,000 mLs Intravenous New Bag/Given 05/25/20 2025)  iohexol (OMNIPAQUE) 350 MG/ML injection 100 mL (80 mLs Intravenous Contrast Given 05/25/20 2007)  ceFEPIme (MAXIPIME) 2 g in sodium chloride 0.9 % 100 mL IVPB (0 g Intravenous Stopped 05/25/20 2054)    ED Course  I have reviewed the triage vital signs and the nursing notes.  Pertinent labs & imaging results that were available during my care of the patient were reviewed by me and considered in my medical decision making (see chart for details).    MDM Rules/Calculators/A&P                         patient with history of ARDS, COVID-19 infection, pulmonary fibrosis here for evaluation of increased shortness of breath. [facility] one week ago to home. Additional history available from the patient's wife after his initial ED assessment. She states that he has been having problems with the saturations since discharge home. He is supposed to be on 10 L by trach collar but they are only able to deliver 9 L by trach collar. He has been experiencing increased episodes of desaturation since discharge home, today he is been the saturating to 50 to 60%. On ED arrival he is maintaining his sats in the low 90s on 10 L trach collar. He has rhonchi on lung exam, tachypnea. Chest x-ray with chronic changes, cannot rule out superimposed infection. Given his recent hospitalization he was started on broad-spectrum antibiotics for possible HCAP. Given  his hospitalization a D dimer was obtained, which was elevated and a CTA was obtained, which is negative for PE. Given his persistent tachypnea, increased oxygen requirement will admit for ongoing treatment for pneumonia. Patient and wife updated findings of studies and he is in agreement with plan.  Final Clinical Impression(s) / ED  Diagnoses Final diagnoses:  None    Rx / DC Orders ED Discharge Orders    None       Quintella Reichert, MD 05/25/20 2120

## 2020-05-25 NOTE — Progress Notes (Signed)
A consult was received from an ED physician for vancomycin per pharmacy dosing.  The patient's profile has been reviewed for ht/wt/allergies/indication/available labs.   A one time order has been placed for vancomycin 1500 mg IV.  Further antibiotics/pharmacy consults should be ordered by admitting physician if indicated.                       Thank you, Efraim Kaufmann, PharmD, BCPS 05/25/2020  7:23 PM

## 2020-05-25 NOTE — Telephone Encounter (Signed)
Patient scheduled with Dr. [name] on 06/03/2020 -pr

## 2020-05-26 ENCOUNTER — Encounter (HOSPITAL_COMMUNITY): Payer: Self-pay | Admitting: Internal Medicine

## 2020-05-26 DIAGNOSIS — R1310 Dysphagia, unspecified: Secondary | ICD-10-CM | POA: Diagnosis present

## 2020-05-26 DIAGNOSIS — J411 Mucopurulent chronic bronchitis: Secondary | ICD-10-CM | POA: Diagnosis present

## 2020-05-26 DIAGNOSIS — Z8616 Personal history of COVID-19: Secondary | ICD-10-CM | POA: Diagnosis not present

## 2020-05-26 DIAGNOSIS — Z96642 Presence of left artificial hip joint: Secondary | ICD-10-CM | POA: Diagnosis present

## 2020-05-26 DIAGNOSIS — J9621 Acute and chronic respiratory failure with hypoxia: Secondary | ICD-10-CM | POA: Diagnosis present

## 2020-05-26 DIAGNOSIS — J9601 Acute respiratory failure with hypoxia: Secondary | ICD-10-CM | POA: Diagnosis not present

## 2020-05-26 DIAGNOSIS — G4730 Sleep apnea, unspecified: Secondary | ICD-10-CM | POA: Diagnosis present

## 2020-05-26 DIAGNOSIS — K219 Gastro-esophageal reflux disease without esophagitis: Secondary | ICD-10-CM | POA: Diagnosis present

## 2020-05-26 DIAGNOSIS — J9602 Acute respiratory failure with hypercapnia: Secondary | ICD-10-CM | POA: Diagnosis present

## 2020-05-26 DIAGNOSIS — J471 Bronchiectasis with (acute) exacerbation: Secondary | ICD-10-CM | POA: Diagnosis present

## 2020-05-26 DIAGNOSIS — E1165 Type 2 diabetes mellitus with hyperglycemia: Secondary | ICD-10-CM | POA: Diagnosis present

## 2020-05-26 DIAGNOSIS — Z930 Tracheostomy status: Secondary | ICD-10-CM | POA: Diagnosis not present

## 2020-05-26 DIAGNOSIS — B965 Pseudomonas (aeruginosa) (mallei) (pseudomallei) as the cause of diseases classified elsewhere: Secondary | ICD-10-CM | POA: Diagnosis present

## 2020-05-26 DIAGNOSIS — Z23 Encounter for immunization: Secondary | ICD-10-CM | POA: Diagnosis not present

## 2020-05-26 DIAGNOSIS — F419 Anxiety disorder, unspecified: Secondary | ICD-10-CM | POA: Diagnosis present

## 2020-05-26 DIAGNOSIS — D649 Anemia, unspecified: Secondary | ICD-10-CM | POA: Diagnosis present

## 2020-05-26 DIAGNOSIS — J479 Bronchiectasis, uncomplicated: Secondary | ICD-10-CM

## 2020-05-26 DIAGNOSIS — J8410 Pulmonary fibrosis, unspecified: Secondary | ICD-10-CM | POA: Diagnosis present

## 2020-05-26 DIAGNOSIS — Z8249 Family history of ischemic heart disease and other diseases of the circulatory system: Secondary | ICD-10-CM | POA: Diagnosis not present

## 2020-05-26 DIAGNOSIS — M21372 Foot drop, left foot: Secondary | ICD-10-CM | POA: Diagnosis present

## 2020-05-26 DIAGNOSIS — C8110 Nodular sclerosis classical Hodgkin lymphoma, unspecified site: Secondary | ICD-10-CM | POA: Diagnosis present

## 2020-05-26 DIAGNOSIS — Z9221 Personal history of antineoplastic chemotherapy: Secondary | ICD-10-CM | POA: Diagnosis not present

## 2020-05-26 DIAGNOSIS — R0602 Shortness of breath: Secondary | ICD-10-CM | POA: Diagnosis present

## 2020-05-26 DIAGNOSIS — Z20822 Contact with and (suspected) exposure to covid-19: Secondary | ICD-10-CM | POA: Diagnosis present

## 2020-05-26 DIAGNOSIS — J470 Bronchiectasis with acute lower respiratory infection: Secondary | ICD-10-CM | POA: Diagnosis present

## 2020-05-26 DIAGNOSIS — M21371 Foot drop, right foot: Secondary | ICD-10-CM | POA: Diagnosis present

## 2020-05-26 DIAGNOSIS — Z79899 Other long term (current) drug therapy: Secondary | ICD-10-CM | POA: Diagnosis not present

## 2020-05-26 DIAGNOSIS — I10 Essential (primary) hypertension: Secondary | ICD-10-CM | POA: Diagnosis not present

## 2020-05-26 LAB — URINALYSIS, COMPLETE (UACMP) WITH MICROSCOPIC
Bilirubin Urine: NEGATIVE
Glucose, UA: NEGATIVE mg/dL
Ketones, ur: NEGATIVE mg/dL
Leukocytes,Ua: NEGATIVE
Nitrite: NEGATIVE
Protein, ur: NEGATIVE mg/dL
Specific Gravity, Urine: 1.017 (ref 1.005–1.030)
pH: 6 (ref 5.0–8.0)

## 2020-05-26 LAB — RESPIRATORY PANEL BY PCR

## 2020-05-26 LAB — COMPREHENSIVE METABOLIC PANEL
ALT: 22 U/L (ref 0–44)
AST: 20 U/L (ref 15–41)
Albumin: 2.6 g/dL — ABNORMAL LOW (ref 3.5–5.0)
Alkaline Phosphatase: 49 U/L (ref 38–126)
Anion gap: 8 (ref 5–15)
BUN: 20 mg/dL (ref 8–23)
CO2: 30 mmol/L (ref 22–32)
Calcium: 9.1 mg/dL (ref 8.9–10.3)
Chloride: 99 mmol/L (ref 98–111)
Creatinine, Ser: 0.56 mg/dL — ABNORMAL LOW (ref 0.61–1.24)
GFR, Estimated: >60 mL/min (ref >60)
Glucose, Bld: 108 mg/dL — ABNORMAL HIGH (ref 70–99)
Potassium: 3.9 mmol/L (ref 3.5–5.1)
Sodium: 137 mmol/L (ref 135–145)
Total Bilirubin: 0.7 mg/dL (ref 0.3–1.2)
Total Protein: 7 g/dL (ref 6.5–8.1)

## 2020-05-26 LAB — CBC WITH DIFFERENTIAL/PLATELET
Abs Immature Granulocytes: 0.06 10*3/uL (ref 0.00–0.07)
Basophils Absolute: 0.1 10*3/uL (ref 0.0–0.1)
Basophils Relative: 0 %
Eosinophils Absolute: 0.7 10*3/uL — ABNORMAL HIGH (ref 0.0–0.5)
Eosinophils Relative: 6 %
HCT: 32.2 % — ABNORMAL LOW (ref 39.0–52.0)
Hemoglobin: 9.8 g/dL — ABNORMAL LOW (ref 13.0–17.0)
Immature Granulocytes: 1 %
Lymphocytes Relative: 19 %
Lymphs Abs: 2.4 10*3/uL (ref 0.7–4.0)
MCH: 27.9 pg (ref 26.0–34.0)
MCHC: 30.4 g/dL (ref 30.0–36.0)
MCV: 91.7 fL (ref 80.0–100.0)
Monocytes Absolute: 1.3 10*3/uL — ABNORMAL HIGH (ref 0.1–1.0)
Monocytes Relative: 10 %
Neutro Abs: 8 10*3/uL — ABNORMAL HIGH (ref 1.7–7.7)
Neutrophils Relative %: 64 %
Platelets: 218 10*3/uL (ref 150–400)
RBC: 3.51 MIL/uL — ABNORMAL LOW (ref 4.22–5.81)
RDW: 14.6 % (ref 11.5–15.5)
WBC: 12.5 10*3/uL — ABNORMAL HIGH (ref 4.0–10.5)
nRBC: 0 % (ref 0.0–0.2)

## 2020-05-26 LAB — EXPECTORATED SPUTUM ASSESSMENT W GRAM STAIN, RFLX TO RESP C

## 2020-05-26 LAB — PROTIME-INR
INR: 1.3 — ABNORMAL HIGH (ref 0.8–1.2)
Prothrombin Time: 15.3 seconds — ABNORMAL HIGH (ref 11.4–15.2)

## 2020-05-26 LAB — CBG MONITORING, ED
Glucose-Capillary: 108 mg/dL — ABNORMAL HIGH (ref 70–99)
Glucose-Capillary: 100 mg/dL — ABNORMAL HIGH (ref 70–99)
Glucose-Capillary: 112 mg/dL — ABNORMAL HIGH (ref 70–99)
Glucose-Capillary: 95 mg/dL (ref 70–99)

## 2020-05-26 LAB — GLUCOSE, CAPILLARY
Glucose-Capillary: 110 mg/dL — ABNORMAL HIGH (ref 70–99)
Glucose-Capillary: 80 mg/dL (ref 70–99)

## 2020-05-26 LAB — APTT: aPTT: 37 seconds — ABNORMAL HIGH (ref 24–36)

## 2020-05-26 LAB — MAGNESIUM: Magnesium: 1.7 mg/dL (ref 1.7–2.4)

## 2020-05-26 MED ORDER — TRIAMCINOLONE 0.1 % CREAM:EUCERIN CREAM 1:1
TOPICAL_CREAM | Freq: Three times a day (TID) | CUTANEOUS | Status: DC | PRN
  Filled 2020-05-26: qty 1

## 2020-05-26 MED ORDER — GUAIFENESIN 100 MG/5ML PO SOLN: 30.0000 mL | Freq: Two times a day (BID) | ORAL | Status: DC

## 2020-05-26 MED ORDER — ORAL CARE MOUTH RINSE
15.0000 mL | OROMUCOSAL | Status: DC
  Administered 2020-05-26 – 2020-05-29 (×25): 15 mL via OROMUCOSAL

## 2020-05-26 MED ORDER — OSMOLITE 1.2 CAL PO LIQD
1000.0000 mL | ORAL | Status: DC
  Administered 2020-05-27: 1000 mL
  Filled 2020-05-26 (×3): qty 1000

## 2020-05-26 MED ORDER — IPRATROPIUM-ALBUTEROL 0.5-2.5 (3) MG/3ML IN SOLN
3.0000 mL | Freq: Four times a day (QID) | RESPIRATORY_TRACT | Status: DC
  Administered 2020-05-26 – 2020-05-29 (×16): 3 mL via RESPIRATORY_TRACT
  Filled 2020-05-26 (×16): qty 3

## 2020-05-26 MED ORDER — CHLORHEXIDINE GLUCONATE 0.12% ORAL RINSE (MEDLINE KIT)
15.0000 mL | Freq: Two times a day (BID) | OROMUCOSAL | Status: DC
  Administered 2020-05-26 – 2020-05-29 (×5): 15 mL via OROMUCOSAL

## 2020-05-26 MED ORDER — SODIUM CHLORIDE 3 % IN NEBU
4.0000 mL | INHALATION_SOLUTION | Freq: Two times a day (BID) | RESPIRATORY_TRACT | Status: AC
  Administered 2020-05-26 – 2020-05-28 (×4): 4 mL via RESPIRATORY_TRACT
  Filled 2020-05-26 (×6): qty 4

## 2020-05-26 MED ORDER — GUAIFENESIN 100 MG/5ML PO SOLN
15.0000 mL | Freq: Four times a day (QID) | ORAL | Status: DC
  Administered 2020-05-26 – 2020-05-29 (×13): 300 mg
  Filled 2020-05-26 (×2): qty 20
  Filled 2020-05-26: qty 10
  Filled 2020-05-26 (×4): qty 20
  Filled 2020-05-26 (×2): qty 10
  Filled 2020-05-26 (×3): qty 20

## 2020-05-26 MED ORDER — PANTOPRAZOLE SODIUM 40 MG PO PACK
40.0000 mg | PACK | Freq: Every day | ORAL | Status: DC
  Administered 2020-05-26 – 2020-05-29 (×4): 40 mg
  Filled 2020-05-26 (×4): qty 20

## 2020-05-26 MED ORDER — CHLORHEXIDINE GLUCONATE CLOTH 2 % EX PADS
6.0000 | MEDICATED_PAD | Freq: Every day | CUTANEOUS | Status: DC
  Administered 2020-05-26 – 2020-05-29 (×4): 6 via TOPICAL

## 2020-05-26 MED ORDER — LEVOFLOXACIN 25 MG/ML PO SOLN
750.0000 mg | Freq: Every day | ORAL | Status: DC
  Administered 2020-05-26 – 2020-05-27 (×2): 750 mg
  Filled 2020-05-26 (×3): qty 30

## 2020-05-26 NOTE — ED Notes (Signed)
Pharmacy made aware medications and Osmolite feeds needed at this time

## 2020-05-26 NOTE — Consult Note (Addendum)
NAME:  Steve [name], MRN:  [id number], DOB:  1955/09/19, LOS: 0 ADMISSION DATE:  05/25/2020, CONSULTATION DATE:  2/15 REFERRING MD:  Louanne Belton, CHIEF COMPLAINT:  Trach management and acute on chronic hypoxia     History of Present Illness:   65 year old male patient well-known to the pulmonary service in the setting of what is now chronic respiratory failure and chronic tracheostomy dependence following Covid related pneumonia with ARDS and subsequent prolonged ventilatory dependence. He was ultimately discharged initially to long-term acute care on a ventilator, then discharged to home on mechanical ventilation with hospice, we last saw him about 1 year after his initial illness for tracheostomy dislodgment and acute on chronic respiratory failure. During this hospital stay he was actually liberated from mechanical ventilation, and given his significant improvement it was decided to discharge him to LTAC setting in effort to maximize his rehabilitation efforts. His wife has been in contact with me throughout his stay in the long-term acute care. He actually had passed his swallowing evaluation at one point and had started a diet, however during his stay had an episode where he began to decline ultimately requiring short-term ventilation and his tracheostomy was subsequently changed to a cuffed trach to facilitate this. There was some talk about desaturating whenever his cuff was deflated at one point, and ultimately he was discharged to home once again off from the ventilator but with a cuffed tracheostomy this was about 1 week prior to presentation here to the emergency room. He presents to the emergency room today on 2/15 w/ worsening purulent mucus, frequent need for suctioning, and more frequent episodes of tracheal obstruction from mucous. He had an acute episode this morning on 2/15 ultimately resulting in EMS being summoned. They tried but were unable to pass suction catheter through existing  tracheostomy and he was brought to the emergency room. On EMS initial arrival pulse oximetry noted to be 60s on 10 L, after transport to the emergency room resting comfortably on his typical 10 L. He was seen by speech therapy who notified pulmonary of his arrival, and we were subsequently consulted by internal medicine service to assess his tracheostomy as well as make any additional recommendations.    Past Medical History:  Chronic respiratory failure secondary to Covid related pulmonary fibrosis and bronchiectasis Tracheostomy dependence with poor cough mechanics Severe deconditioning Dysphagia. Actually has passed his swallow eval Lymphoma Prior stenotrophomonas pulm colonization Bilateral foot drop   Significant Hospital Events:  2/15 admitted for obs. Sputum sent. Trach changed from portex to 6 shiley   Consults:  pulm  Procedures:  Trach change   Significant Diagnostic Tests:  CT chest 1. No pulmonary embolus. Dilatation of the main pulmonary artery consistent with pulmonary arterial hypertension.2. Progressive thoracic adenopathy. This is greater than typically seen with reactive adenopathy, and suspicious for Hodgkin's lymphoma Recurrence. 3. Extensive interstitial lung disease with pulmonary fibrosis and bronchiectasis. No acute pulmonary findings.  Micro Data:  Respiratory culture 2/15>>>  Antimicrobials:  vanc and zosyn 2/14 levaquin 2/15>>>  Interim History / Subjective:  Feels a little better after trach change   Objective   Blood pressure 94/60, pulse (Abnormal) 101, temperature 98.9 F (37.2 C), temperature source Oral, resp. rate (Abnormal) 25, height 5\' 5"  (1.651 m), weight 62 kg, SpO2 91 %.    FiO2 (%):  [40 %-60 %] 40 %  No intake or output data in the 24 hours ending 05/26/20 1154 Filed Weights   05/25/20 1738  Weight: 62 kg  Examination: General: deconditioned 65 year old white male. He is resting in stretcher. Not in distress  HENT: sp  raspy. Improved quality of phonation after trach change. Now has cuffless 6 shiley in place. His stoma is large. Well matured. Does have yellow thick purulent tracheal secretions Lungs: dec t/o currently on AC 40% requiring 10 liters Cardiovascular: RRR Abdomen: soft  Extremities: warm dry bilateral foot drop  Neuro: awake and oriented  GU: voids  Resolved Hospital Problem list     Assessment & Plan:  Acute on chronic hypoxic respiratory failure 2/2 purulent bronchitis/bronchiectasis flare w/ intermittent mucous plugging,  superimposed on underlying COVID related ILD and severe deconditioning.  Tracheostomy dependence   -I agree that he is here due to mucous plugging but this has been confounded by the fact that his trach he had in place was a cuffed portex. The ID of the particular trach was smaller in diameter than typical size 6 and was easily occluded. Making things even more challenging was that the deflated uncuffed part of the trach was really only acting to occlude his airway further due to additional mucous occluding the airway at that point.   Plan/rec Sputum sent Changed to 6 cuffless Resume PMV as tolerated Agree he needs to go home w/ HT nebs for pulm toilet. Would also benefit from vibravest. (we should treat him as a bronchiectasis patient).  Added VT levaquin given h/o stenotrophamonas infection  Have placed consult to [facility] RN. He has multiple DME needs before he can safely get home: suction catheters, neb machine, needs to be sure he has two concentrators to allow for his 10 liters oxygen.   My hope is we can get him feeling a little better and we can do the rest of this thru the out pt setting. Dr Erin Fulling is going to see him later today and make further suggestions re: btx and respiratory failure.  I am planning on following him closely thru the trach clinic after dc I spoke to his wife at length over the phone   Erick Colace ACNP-BC [place] Pager # [phone] OR # [phone] if no answer   Best practice (evaluated daily)  Per primary   Labs   CBC: Recent Labs  Lab 05/25/20 1742 05/26/20 0535  WBC 14.3* 12.5*  NEUTROABS 10.7* 8.0*  HGB 11.0* 9.8*  HCT 35.4* 32.2*  MCV 89.8 91.7  PLT 228 154    Basic Metabolic Panel: Recent Labs  Lab 05/25/20 1742 05/26/20 0535  NA 138 137  K 4.5 3.9  CL 95* 99  CO2 33* 30  GLUCOSE 122* 108*  BUN 29* 20  CREATININE 0.53* 0.56*  CALCIUM 9.5 9.1  MG  --  1.7   GFR: Estimated Creatinine Clearance: 81.1 mL/min (A) (by C-G formula based on SCr of 0.56 mg/dL (L)). Recent Labs  Lab 05/25/20 1742 05/25/20 1854 05/25/20 2054 05/26/20 0535  PROCALCITON  --  <0.10  --   --   WBC 14.3*  --   --  12.5*  LATICACIDVEN  --  0.6 0.6  --     Liver Function Tests: Recent Labs  Lab 05/25/20 1742 05/26/20 0535  AST 23 20  ALT 25 22  ALKPHOS 61 49  BILITOT 0.5 0.7  PROT 7.9 7.0  ALBUMIN 3.0* 2.6*   No results for input(s): LIPASE, AMYLASE in the last 168 hours. No results for input(s): AMMONIA in the last 168 hours.  ABG    Component Value Date/Time  PHART 7.343 (L) 05/25/2020 2235   PCO2ART 59.1 (H) 05/25/2020 2235   PO2ART 133 (H) 05/25/2020 2235   HCO3 31.3 (H) 05/25/2020 2235   TCO2 28 07/08/2019 1331   O2SAT 98.3 05/25/2020 2235     Coagulation Profile: Recent Labs  Lab 05/26/20 0535  INR 1.3*    Cardiac Enzymes: No results for input(s): CKTOTAL, CKMB, CKMBINDEX, TROPONINI in the last 168 hours.  HbA1C: Hgb A1c MFr Bld  Date/Time Value Ref Range Status  03/23/2020 12:39 PM 5.7 (H) 4.8 - 5.6 % Final    Comment:    (NOTE) Pre diabetes:          5.7%-6.4%  Diabetes:              >6.4%  Glycemic control for   <7.0% adults with diabetes   07/09/2019 05:10 AM 7.3 (H) 4.8 - 5.6 % Final    Comment:    (NOTE) Pre diabetes:          5.7%-6.4% Diabetes:              >6.4% Glycemic control for   <7.0% adults with diabetes     CBG: Recent  Labs  Lab 05/26/20 0148 05/26/20 0534  GLUCAP 108* 100*    Review of Systems:   Review of Systems  Constitutional: Negative for chills, fever and malaise/fatigue.  HENT: Positive for congestion.   Eyes: Negative.   Respiratory: Positive for cough, sputum production and shortness of breath.   Cardiovascular: Negative.   Gastrointestinal: Negative.   Genitourinary: Negative.   Musculoskeletal: Negative.   Skin: Negative.   Neurological: Negative.   Endo/Heme/Allergies: Negative.   Psychiatric/Behavioral: Negative.      Past Medical History:  He,  has a past medical history of Abscess of muscle (08/10/2011), Acute on chronic respiratory failure with hypoxia ([place]), Acute respiratory distress syndrome (ARDS) due to COVID-19 virus ([place]), Anxiety, COVID-19 virus infection, Diabetes mellitus without complication ([place]), GERD (gastroesophageal reflux disease), HCAP (healthcare-associated pneumonia), Healthcare-associated pneumonia, Hip dysplasia, congenital, Hodgkin lymphoma ([place]), Hodgkin's lymphoma ([place]), Hypertension, Pancreatitis, Pneumothorax, acute, Postoperative anemia due to acute blood loss (09/07/2012), Septic arthritis of hip ([place]) (09/05/2012), and Sleep apnea.   Surgical History:   Past Surgical History:  Procedure Laterality Date  . COLONOSCOPY  04/26/2007  . HERNIA REPAIR     inguinal hernia x3  . IR GASTROSTOMY TUBE MOD SED  08/22/2019  . IR IMAGING GUIDED PORT INSERTION  03/29/2019  . JOINT REPLACEMENT  2002 & 2007   bilateral hip replacement  . LYMPH NODE BIOPSY Right 03/20/2019   Procedure: DEEP RIGHT SUPRACLAVICULAR LYMPH NODE EXCISION;  Surgeon: Fanny Skates, MD;  Location: [facility];  Service: General;  Laterality: Right;  . MULTIPLE EXTRACTIONS WITH ALVEOLOPLASTY  07/28/2011   Procedure: MULTIPLE EXTRACION WITH ALVEOLOPLASTY;  Surgeon: Lenn Cal, DDS;  Location: [facility];  Service: Oral Surgery;  Laterality: N/A;  Extraction of tooth #'s  2,3,4,5,6,11,12,13,15,19,22 with alveoloplasty.  . shoulder repair - right for separation of shoulder    . TEE WITHOUT CARDIOVERSION  07/29/2011   Procedure: TRANSESOPHAGEAL ECHOCARDIOGRAM (TEE);  Surgeon: Josue Hector, MD;  Location: [facility];  Service: Cardiovascular;  Laterality: N/A;  . TOTAL HIP REVISION Right 09/05/2012   Procedure: RIGHT HIP RESECTION ARTHROPLASTY WITH ANTIBIOTIC SPACERS;  Surgeon: [name], MD;  Location: [facility];  Service: Orthopedics;  Laterality: Right;  . TOTAL HIP REVISION Right 11/30/2012   Procedure: RIGHT TOTAL HIP ARTHROPLASTY REIMPLANTATION;  Surgeon: Dione Plover  Aluisio, MD;  Location: [facility];  Service: Orthopedics;  Laterality: Right;     Social History:   reports that he has never smoked. He has never used smokeless tobacco. He reports current alcohol use. He reports that he does not use drugs.   Family History:  His family history includes Heart attack in his mother; Melanoma in his mother. There is no history of Hyperlipidemia, Sudden death, Hypertension, or Diabetes.   Allergies No Known Allergies   Home Medications  Prior to Admission medications   Medication Sig Start Date End Date Taking? Authorizing Provider  albuterol (PROVENTIL) (2.5 MG/3ML) 0.083% nebulizer solution Take 3 mLs (2.5 mg total) by nebulization every 2 (two) hours as needed for wheezing. 04/17/20  Yes Florencia Reasons, MD  Amino Acids-Protein Hydrolys (FEEDING SUPPLEMENT, PRO-STAT SUGAR FREE 64,) LIQD Place 30 mLs into feeding tube daily.   Yes [provider]  chlorhexidine (PERIDEX) 0.12 % solution 15 mLs by Mouth Rinse route 2 (two) times daily. 04/17/20  Yes Florencia Reasons, MD  Cholecalciferol (VITAMIN D3) 10 MCG/ML LIQD Give 400 Units by tube daily. Patient taking differently: Place 400 Units into feeding tube daily. 04/17/20  Yes Florencia Reasons, MD  enoxaparin (LOVENOX) 40 MG/0.4ML injection Inject 0.4 mLs (40 mg total) into the skin daily. 04/17/20  Yes Florencia Reasons, MD  ferrous sulfate  300 (60 Fe) MG/5ML syrup Place 5 mLs (300 mg total) into feeding tube daily with breakfast. 08/28/19  Yes Ollis, Brandi L, NP  furosemide (LASIX) 20 MG tablet Place 20 mg into feeding tube daily.   Yes [provider]  Insulin Aspart, w/Niacinamide, (FIASP) 100 UNIT/ML SOLN Inject 5 Units into the skin 3 (three) times daily.   Yes [provider]  lansoprazole (PREVACID) 30 MG capsule Place 30 mg into feeding tube daily at 12 noon.   Yes [provider]  LORazepam (ATIVAN) 0.5 MG tablet Place 1 tablet (0.5 mg total) into feeding tube every 4 (four) hours as needed for anxiety. 04/17/20  Yes Florencia Reasons, MD  Multiple Vitamin (MULTIVITAMIN) LIQD Place 15 mLs into feeding tube daily. Patient taking differently: No sig reported 08/28/19  Yes Ollis, Brandi L, NP  Nutritional Supplements (FEEDING SUPPLEMENT, OSMOLITE 1.2 CAL,) LIQD Place 1,000 mLs into feeding tube continuous. 04/17/20  Yes Florencia Reasons, MD  sertraline (ZOLOFT) 50 MG tablet Place 1 tablet (50 mg total) into feeding tube daily. 08/28/19  Yes Ollis, Brandi L, NP  triamcinolone (KENALOG) 0.1 % Apply 1 application topically 2 (two) times daily as needed (itching).   Yes [provider]  Water For Irrigation, Sterile (FREE WATER) SOLN Place 100 mLs into feeding tube every 4 (four) hours. 04/17/20  Yes Florencia Reasons, MD  acetaminophen (TYLENOL) 160 MG/5ML solution Place 20.3 mLs (650 mg total) into feeding tube every 4 (four) hours as needed for mild pain or fever (temp > 101.5). Patient not taking: Reported on 05/25/2020 08/27/19   Donita Brooks, NP  Chlorhexidine Gluconate Cloth 2 % PADS Apply 6 each topically daily. Patient not taking: No sig reported 04/18/20   Florencia Reasons, MD  insulin aspart (NOVOLOG) 100 UNIT/ML injection Inject 0-15 Units into the skin 3 (three) times daily with meals. Patient not taking: No sig reported 04/17/20   Florencia Reasons, MD  Mouthwashes (MOUTH RINSE) LIQD solution 15 mLs by Mouth Rinse route 2 times daily at  12 noon and 4 pm. Patient not taking: No sig reported 04/17/20   Florencia Reasons, MD  Nutritional Supplements (FEEDING  SUPPLEMENT, PROSOURCE TF,) liquid Place 45 mLs into feeding tube 3 (three) times daily. Patient not taking: No sig reported 04/17/20   Florencia Reasons, MD  pantoprazole sodium (PROTONIX) 40 mg/20 mL PACK Place 20 mLs (40 mg total) into feeding tube daily. Patient not taking: No sig reported 04/18/20   Florencia Reasons, MD  traZODone (DESYREL) 100 MG tablet Place 100 mg into feeding tube at bedtime.    [provider]     Critical care time: NA    Erick Colace ACNP-BC [place] Pager # [phone] OR # [phone] if no answer

## 2020-05-26 NOTE — ED Notes (Signed)
Respiratory at beside 

## 2020-05-26 NOTE — Progress Notes (Addendum)
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  Of note, patient was recently at [facility] a week back and since discharge she has been having more cough and copious sputum production.  He also had shortness of breath, hypoxia with choking spell so decided come to the hospital Review of previous records showed previous hospitalization for COVID ARDS with bilateral pneumothorax and tension pneumomediastinum. . Patient was  hospitalized for an extended period of time from 12/13 2021 until 04/17/2020 but during this hospitalization, he was weaned off the ventilator to trach collar.  On this admission, CT scan of the chest was performed which showed extensive pulmonary fibrosis and bronchiectasis but no evidence of new pneumonia.  Patient was then considered for admission to the hospital for further evaluation and treatment.  Assessment/Plan:  Principal Problem:   Acute on chronic respiratory failure with hypoxia ([name]) Active Problems:   Hodgkin's lymphoma ([place])   Uncontrolled type 2 diabetes mellitus with hyperglycemia, with long-term current use of insulin ([name])   Essential hypertension   GERD without esophagitis   Pulmonary fibrosis ([name])   Bronchiectasis ([name])  Acute on chronic respiratory failure with hypoxia  Currently on tracheostomy.  Supposed to follow-up at trach clinic on 05/22/2020 but had missed.  Will inform critical care team for trach assessment and management. patient with  increased thick yellowish sputum and desaturation in the 60s presentation. Mild leukocytosis but no fever. Follow sputum and blood cultures.  Respiratory viral panel negative.  Continue hypertonic saline nebulization. Chest physiotherapy. Procalcitonin less than 0.10. Covid test and flu was negative. Mild leukocytosis. Currently on IV vancomycin and cefepime.  We will continue to monitor closely.  Hodgkin's lymphoma Classic Hodgkin's lymphoma, nodular sclerosis subtype stage III diagnosed 02/2019. Follows up with Dr. Maylon Peppers, currently not on treatment. CT imaging of the chest performed during this hospitalization reveals progressive adenopathy. Will need to follow-up with oncology as outpatient  Uncontrolled type 2 diabetes mellitus with hyperglycemia, On tube feeds, Accu-Cheks, sliding scale insulin. Hemoglobin A1c pending    Pulmonary fibrosis/bronchiectasis Extensive postinfection pulmonary fibrosis and bronchiectasis, patient is debilitated.  Currently on trach collar needs suctioning and trach care management.  Dysphagia Currently on tube feeds at home. Speech therapy for swallow evaluation currently NPO.    GERD without esophagitis   PPI   DVT prophylaxis: enoxaparin (LOVENOX) injection 40 mg Start: 05/26/20 1000   Code Status: Full code  Family Communication: Spoke with the patient's spouse on the phone and updated her about the clinical condition of the patient  Status is: Observation  The patient will require care spanning > 2 midnights and should be moved to inpatient because: IV treatments appropriate due to intensity of illness or inability to take PO and Inpatient level of care appropriate due to severity of illness, on IV antibiotic, follow cultures  Dispo: The patient is from: Home              Anticipated d/c is to: Home              Anticipated d/c date is:  2 days              Patient currently is not medically stable to d/c.   Difficult to place patient  No   Consultants:  Critical care team for trach assessment  Procedures:  None  Anti-infectives:  Marland Kitchen Vancomycin 2/14> . cefepime 2/14>  Anti-infectives (From admission, onward)   Start     Dose/Rate Route Frequency Ordered Stop   05/25/20 2000  ceFEPIme (MAXIPIME) 1 g in sodium chloride 0.9 % 100 mL IVPB  Status:  Discontinued        1 g 200 mL/hr over 30 Minutes Intravenous  Once 05/25/20 1922 05/25/20 1958   05/25/20 2000  vancomycin (VANCOREADY) IVPB 1500 mg/300 mL        1,500 mg 150 mL/hr over 120 Minutes Intravenous  Once 05/25/20 1923 05/25/20 2322   05/25/20 2000  ceFEPIme (MAXIPIME) 2 g in sodium chloride 0.9 % 100 mL IVPB        2 g 200 mL/hr over 30 Minutes Intravenous  Once 05/25/20 1958 05/25/20 2054   05/25/20 1900  ceFEPIme (MAXIPIME) 1 g in sodium chloride 0.9 % 100 mL IVPB  Status:  Discontinued        1 g 200 mL/hr over 30 Minutes Intravenous  Once 05/25/20 1853 05/25/20 1958     Subjective: Today, patient was seen and examined at bedside.  Patient complains of secretions from the tracheostomy tube with mild shortness of breath.  Denies any chest pain, patient denies any nausea vomiting abdominal pain.   Objective: Vitals:   05/26/20 0615 05/26/20 0730  BP: 92/67 (!) 100/57  Pulse: 76 98  Resp: (!) 24 (!) 28  Temp:    SpO2: 96% 92%   No intake or output data in the 24 hours ending 05/26/20 0844 Filed Weights   05/25/20 1738  Weight: 62 kg   Body mass index is 22.75 kg/m.   Physical Exam:  GENERAL: Patient is alert awake and oriented, communicative, trach collar in place,  Not in obvious distress. HENT: No scleral pallor or icterus. Pupils equally reactive to light. Oral mucosa is dry tracheostomy in place. Minimally verbal. NECK: is supple, no gross swelling noted. CHEST:   Diminished breath sounds bilaterally.  Coarse wheezes noted CVS: S1 and S2 heard, no murmur. Regular rate and rhythm.  ABDOMEN: Soft, non-tender, bowel sounds are  present. PEG tube in place with mild erythema around the PEG tube EXTREMITIES: No edema. CNS: Cranial nerves are intact. Moves all extremities, follows commands. SKIN: warm and dry without rashes.  Data Review: I have personally reviewed the following laboratory data and studies,  CBC: Recent Labs  Lab 05/25/20 1742 05/26/20 0535  WBC 14.3* 12.5*  NEUTROABS 10.7* 8.0*  HGB 11.0* 9.8*  HCT 35.4* 32.2*  MCV 89.8 91.7  PLT 228 277   Basic Metabolic Panel: Recent Labs  Lab 05/25/20 1742 05/26/20 0535  NA 138 137  K 4.5 3.9  CL 95* 99  CO2 33* 30  GLUCOSE 122* 108*  BUN 29* 20  CREATININE 0.53* 0.56*  CALCIUM 9.5 9.1  MG  --  1.7   Liver Function Tests: Recent Labs  Lab 05/25/20 1742 05/26/20 0535  AST 23 20  ALT 25 22  ALKPHOS 61 49  BILITOT 0.5 0.7  PROT 7.9 7.0  ALBUMIN 3.0* 2.6*   No results for input(s): LIPASE, AMYLASE in the last 168 hours. No results for input(s): AMMONIA in the last 168 hours. Cardiac Enzymes: No  results for input(s): CKTOTAL, CKMB, CKMBINDEX, TROPONINI in the last 168 hours. BNP (last 3 results) Recent Labs    07/08/19 1815 07/11/19 0212 07/14/19 0458  BNP 407.1* 224.8* 55.9    ProBNP (last 3 results) No results for input(s): PROBNP in the last 8760 hours.  CBG: Recent Labs  Lab 05/26/20 0148 05/26/20 0534  GLUCAP 108* 100*   Recent Results (from the past 240 hour(s))  Expectorated Sputum Assessment w Gram Stain, Rflx to Resp Cult     Status: None   Collection Time: 05/25/20  1:06 AM   Specimen: Sputum  Result Value Ref Range Status   Specimen Description SPUTUM  Final   Special Requests NONE  Final   Sputum evaluation   Final    Sputum specimen not acceptable for testing.  Please recollect.   CALLED TO JEREMY [facility] @ 3500 ON 05/26/20 C VARNER Performed at [facility], [place] [address], [place], [place] [address]    Report Status 05/26/2020 FINAL  Final  Resp Panel by RT-PCR (Flu A&B, Covid)  Nasopharyngeal Swab     Status: None   Collection Time: 05/25/20  7:28 PM   Specimen: Nasopharyngeal Swab; Nasopharyngeal(NP) swabs in vial transport medium  Result Value Ref Range Status   SARS Coronavirus 2 by RT PCR NEGATIVE NEGATIVE Final    Comment: (NOTE) SARS-CoV-2 target nucleic acids are NOT DETECTED.  The SARS-CoV-2 RNA is generally detectable in upper respiratory specimens during the acute phase of infection. The lowest concentration of SARS-CoV-2 viral copies this assay can detect is 138 copies/mL. A negative result does not preclude SARS-Cov-2 infection and should not be used as the sole basis for treatment or other patient management decisions. A negative result may occur with  improper specimen collection/handling, submission of specimen other than nasopharyngeal swab, presence of viral mutation(s) within the areas targeted by this assay, and inadequate number of viral copies(<138 copies/mL). A negative result must be combined with clinical observations, patient history, and epidemiological information. The expected result is Negative.  Fact Sheet for Patients:  EntrepreneurPulse.com.au  Fact Sheet for Healthcare Providers:  IncredibleEmployment.be  This test is no t yet approved or cleared by the [place] FDA and  has been authorized for detection and/or diagnosis of SARS-CoV-2 by FDA under an Emergency Use Authorization (EUA). This EUA will remain  in effect (meaning this test can be used) for the duration of the COVID-19 declaration under Section 564(b)(1) of the Act, 21 U.S.C.section 360bbb-3(b)(1), unless the authorization is terminated  or revoked sooner.       Influenza A by PCR NEGATIVE NEGATIVE Final   Influenza B by PCR NEGATIVE NEGATIVE Final    Comment: (NOTE) The Xpert Xpress SARS-CoV-2/FLU/RSV plus assay is intended as an aid in the diagnosis of influenza from Nasopharyngeal swab specimens and should not be  used as a sole basis for treatment. Nasal washings and aspirates are unacceptable for Xpert Xpress SARS-CoV-2/FLU/RSV testing.  Fact Sheet for Patients: EntrepreneurPulse.com.au  Fact Sheet for Healthcare Providers: IncredibleEmployment.be  This test is not yet approved or cleared by the [place] FDA and has been authorized for detection and/or diagnosis of SARS-CoV-2 by FDA under an Emergency Use Authorization (EUA). This EUA will remain in effect (meaning this test can be used) for the duration of the COVID-19 declaration under Section 564(b)(1) of the Act, 21 U.S.C. section 360bbb-3(b)(1), unless the authorization is terminated or revoked.  Performed at [facility], [place] [address], [place], [place] [address]  Studies: CT Angio Chest PE W/Cm &/Or Wo Cm  Result Date: 05/25/2020 CLINICAL DATA:  PE suspected, low/intermediate prob, positive D-dimer Shortness of breath with decreased oxygen saturation. EXAM: CT ANGIOGRAPHY CHEST WITH CONTRAST TECHNIQUE: Multidetector CT imaging of the chest was performed using the standard protocol during bolus administration of intravenous contrast. Multiplanar CT image reconstructions and MIPs were obtained to evaluate the vascular anatomy. CONTRAST:  20mL OMNIPAQUE IOHEXOL 350 MG/ML SOLN COMPARISON:  Radiograph earlier today. Chest CT 03/24/2020. PET CT 06/14/2019 FINDINGS: Cardiovascular: There are no filling defects within the pulmonary arteries to suggest pulmonary embolus. Subsegmental branches are not well assessed due to breathing motion artifact. Dilatation of the main pulmonary artery at 3.5 cm. Mild aortic atherosclerosis. No acute aortic findings. Left chest port with tip at the atrial caval junction. Mild cardiomegaly. No pericardial effusion. Mediastinum/Nodes: Extensive mediastinal adenopathy that has progressed from prior exam. Representative node or nodal conglomerate in the  right lower paratracheal station measures 3.9 x 4.0 cm, previously 3.2 x 3.6 cm. High right mediastinal/low supraclavicular node measures 2.3 cm short axis, previously 1.7 cm by my retrospective measurement. Progressive soft tissue density presumed nodal tissue in the right anterior mediastinum anterior to the aorta and IVC. Enlarged right internal mammary node adjacent to the lower sternum measures 2.1 cm. No axillary adenopathy. Tracheostomy with tip above the carina. Minimal retained mucus in the trachea. No esophageal wall thickening. No visualized thyroid nodule. Lungs/Pleura: Extensive chronic lung disease with multifocal bronchiectasis, peripheral honeycombing, and diffuse ground-glass opacities throughout the lung parenchyma. Calcified granuloma in the left lower lobe. No evidence of acute airspace disease. No significant pleural effusion. Previous pigtail catheter in the right anterior chest has been removed. No pneumothorax. Upper Abdomen: Small retrosternal lymph node measures 6 mm, series 4, image 99. Limited imaging of the upper abdomen without other acute finding. Musculoskeletal: No acute osseous abnormality or focal bone lesion. Thoracic spine spondylosis. Review of the MIP images confirms the above findings. IMPRESSION: 1. No pulmonary embolus. Dilatation of the main pulmonary artery consistent with pulmonary arterial hypertension. 2. Progressive thoracic adenopathy. This is greater than typically seen with reactive adenopathy, and suspicious for Hodgkin's lymphoma recurrence. 3. Extensive interstitial lung disease with pulmonary fibrosis and bronchiectasis. No acute pulmonary findings. Aortic Atherosclerosis (ICD10-I70.0). Electronically Signed   By: Keith Rake M.D.   On: 05/25/2020 20:44   DG Chest Port 1 View  Result Date: 05/25/2020 CLINICAL DATA:  Shortness of breath. EXAM: PORTABLE CHEST 1 VIEW COMPARISON:  Most recent radiograph 04/07/2020 FINDINGS: Tracheostomy tube tip at the  thoracic inlet. Left chest port remains in place. Chronic elevation of right hemidiaphragm. Interstitial lung disease with peripheral predominant reticulation, chronic and unchanged. No new airspace disease. Stable cardiomegaly and mediastinal contours. No pneumothorax. IMPRESSION: 1. Stable chronic interstitial lung disease. No new abnormalities. 2. Stable cardiomegaly. 3. Tracheostomy tube unchanged with tip at the thoracic inlet. Electronically Signed   By: Keith Rake M.D.   On: 05/25/2020 18:56      Flora Lipps, MD  [facility] 05/26/2020  If 7PM-7AM, please contact night-coverage

## 2020-05-26 NOTE — ED Notes (Signed)
Respiratory sputum not acceptable per lab. Too many epithelial cells present.

## 2020-05-26 NOTE — Progress Notes (Signed)
Kangaroo feeding pump unavailable at this time.  Pt was given 280 ml of osmolite via bolus feed as wife states this is what has been done at home.  Pt tolerated bolus feeding well. Miguel Christiana, Laurel Dimmer, RN

## 2020-05-26 NOTE — ED Notes (Signed)
Pulmonology at bedside.

## 2020-05-26 NOTE — ED Notes (Signed)
Pt suctioned 3 times initial sat 87% after suctioning sats increased to 90%.

## 2020-05-26 NOTE — ED Notes (Signed)
Hospitalist at bedside 

## 2020-05-26 NOTE — Progress Notes (Signed)
Pharmacy Antibiotic Note  Steve [name] is a 65 y.o. male admitted on 05/25/2020 with acute on chronic hypoxic respiratory failure and chronic tracheostomy dependence following COVID PNA with ARDs. History of Stenotrophomonas colonization. Pharmacy has been consulted by PCCM for Levofloxacin dosing.  Plan: Levofloxacin 750mg  per tube daily Per PCCM, plan for 14 day course Monitor renal function, cultures, clinical course  Height: 5\' 5"  (165.1 cm) Weight: 62 kg (136 lb 11 oz) IBW/kg (Calculated) : 61.5  Temp (24hrs), Avg:98.9 F (37.2 C), Min:98.9 F (37.2 C), Max:98.9 F (37.2 C)  Recent Labs  Lab 05/25/20 1742 05/25/20 1854 05/25/20 2054 05/26/20 0535  WBC 14.3*  --   --  12.5*  CREATININE 0.53*  --   --  0.56*  LATICACIDVEN  --  0.6 0.6  --     Estimated Creatinine Clearance: 81.1 mL/min (A) (by C-G formula based on SCr of 0.56 mg/dL (L)).    No Known Allergies  Antimicrobials this admission: 2/14 Vancomycin x 1 2/14 Cefepime x 1 2/15 Levofloxacin >>   Dose adjustments this admission: --  Microbiology results: 2/14 BCx: sent 2/14 COVID negative, Influenza A/B negative   2/15 Tracheal aspirate: pending  2/14 Resp panel: negative   Thank you for allowing pharmacy to be a part of this patient's care.   Lindell Spar, PharmD, BCPS Clinical Pharmacist  05/26/2020 1:19 PM

## 2020-05-26 NOTE — ED Notes (Signed)
Speech at bedside

## 2020-05-26 NOTE — ED Notes (Signed)
Pt requesting for his trach to be removed and cleaned. Pt also requesting deep suction. RT made aware.

## 2020-05-26 NOTE — Progress Notes (Signed)
Pt tracheal suctioned for small amount of thick yellow/tan secretions.  Sample sent to lab for processing.

## 2020-05-26 NOTE — H&P (Addendum)
History and Physical    Steve [name] VZC:[id number] DOB: January 10, 1956 DOA: 05/25/2020  PCP: [name], PA-C  Patient coming from: Home via EMS   Chief Complaint: Hypoxia, shortness of breath   HPI:    65 year old male with past medical history of lymphoma, hypertension, gastroesophageal reflux disease, insulin-dependent diabetes mellitus type 2, status post PEG tube placement with tube feeds and chronic respiratory failure secondary to post COVID pulmonary fibrosis and bronchiectasis (S/P trach on 10lpm via trach collar) who presented to [facility] emergency department brought in by EMS after patient exhibited bouts of hypoxia and respiratory distress at home.  Of note, patient underwent two extremely lengthy hospitalizations in 2021.  Was hospitalized at [facility] from 06/2019 until 08/2019 for post COVID ARDS with advanced respiratory failure complicated by bilateral pneumothoraces as well as a tension pneumomediastinum..  After a prolonged hospitalization patient was eventually discharged to [facility] skilled nursing facility status post placement of tracheostomy, PEG tube placement.  Patient was ventilator dependent at the time and remained so for several months. Patient was once again hospitalized for an extended period of time from 12/13 2021 until 04/17/2020 but during this hospitalization was weaned off the ventilator to trach collar.  Majority of the history is been obtained from the patient's spouse who is at the bedside.  Patient was recently discharged from [facility] slightly over 1 week ago.  Since discharge, the patient has been experiencing frequent bouts of cough with copious amounts of sputum production from the tracheostomy.  Sputum is thick yellow with occasionally consume green.  These episodes of thick sputum production are associated with bouts of severe shortness of breath and transient hypoxia a and even a sensation of choking.    According to the spouse there has been no recent evidence of fever, sick contacts or recent contact with COVID-19 infection.  Spouse reports patient has been compliant with all prescribed medical therapies.  The evening of patient's presentation, patient experienced yet another episode of intense shortness of breath, increased purulent drainage.  EMS was contacted and on arrival patient's oxygen saturations were noted to be in the 60s on 10 L of oxygen via trach collar.  Patient was brought into [facility] emergency department for evaluation.  Upon evaluation in the emergency department after adequate suctioning patient has been found to be breathing normally and saturating well on 10 L of oxygen via trach collar.  CT imaging of the chest revealed known extensive pulmonary fibrosis and bronchiectasis but no evidence of new superimposed pneumonia.  Due to patient's extensive chronic lung disease and concerns for a recurrent bout of respiratory distress the emergency department provider felt it would be wise to observe the patient overnight and contact the hospitalist group to evaluate the patient for hospitalization.  Review of Systems:   Review of Systems  Constitutional: Positive for malaise/fatigue.  Respiratory: Positive for cough, sputum production and shortness of breath.   Neurological: Positive for seizures.    Past Medical History:  Diagnosis Date  . Abscess of muscle 08/10/2011   staph infection of right hip   . Acute on chronic respiratory failure with hypoxia ([place])   . Acute respiratory distress syndrome (ARDS) due to COVID-19 virus ([place])   . Anxiety   . COVID-19 virus infection   . Diabetes mellitus without complication ([place])   . GERD (gastroesophageal reflux disease)    rare reflux - no meds for reflux - NO PROBLEM IN PAST SEVERAL YRS  .  HCAP (healthcare-associated pneumonia)   . Healthcare-associated pneumonia   . Hip dysplasia, congenital    no surgery as a  child for hip dysplasia - has had bilateral hip replacements as an adult  . Hodgkin lymphoma ([place])   . Hodgkin's lymphoma ([place])   . Hypertension   . Pancreatitis   . Pneumothorax, acute   . Postoperative anemia due to acute blood loss 09/07/2012  . Septic arthritis of hip ([place]) 09/05/2012   PT'S TOTAL HIP JOINT REMOVED - ANTIBIOTIC SPACE PLACED AND PT HAS FINISHED IV ANTIBIOTICS ( PICC LINE REMOVED)  . Sleep apnea    USES CPAP    Past Surgical History:  Procedure Laterality Date  . COLONOSCOPY  04/26/2007  . HERNIA REPAIR     inguinal hernia x3  . IR GASTROSTOMY TUBE MOD SED  08/22/2019  . IR IMAGING GUIDED PORT INSERTION  03/29/2019  . JOINT REPLACEMENT  2002 & 2007   bilateral hip replacement  . LYMPH NODE BIOPSY Right 03/20/2019   Procedure: DEEP RIGHT SUPRACLAVICULAR LYMPH NODE EXCISION;  Surgeon: Fanny Skates, MD;  Location: [place];  Service: General;  Laterality: Right;  . MULTIPLE EXTRACTIONS WITH ALVEOLOPLASTY  07/28/2011   Procedure: MULTIPLE EXTRACION WITH ALVEOLOPLASTY;  Surgeon: Lenn Cal, DDS;  Location: [facility];  Service: Oral Surgery;  Laterality: N/A;  Extraction of tooth #'s 2,3,4,5,6,11,12,13,15,19,22 with alveoloplasty.  . shoulder repair - right for separation of shoulder    . TEE WITHOUT CARDIOVERSION  07/29/2011   Procedure: TRANSESOPHAGEAL ECHOCARDIOGRAM (TEE);  Surgeon: Josue Hector, MD;  Location: [place];  Service: Cardiovascular;  Laterality: N/A;  . TOTAL HIP REVISION Right 09/05/2012   Procedure: RIGHT HIP RESECTION ARTHROPLASTY WITH ANTIBIOTIC SPACERS;  Surgeon: [name], MD;  Location: [facility];  Service: Orthopedics;  Laterality: Right;  . TOTAL HIP REVISION Right 11/30/2012   Procedure: RIGHT TOTAL HIP ARTHROPLASTY REIMPLANTATION;  Surgeon: [name], MD;  Location: [facility];  Service: Orthopedics;  Laterality: Right;     reports that he has never smoked. He has never used smokeless tobacco. He reports current  alcohol use. He reports that he does not use drugs.  No Known Allergies  Family History  Problem Relation Age of Onset  . Melanoma Mother   . Heart attack Mother   . Hyperlipidemia Neg Hx   . Sudden death Neg Hx   . Hypertension Neg Hx   . Diabetes Neg Hx      Prior to Admission medications   Medication Sig Start Date End Date Taking? Authorizing Provider  albuterol (PROVENTIL) (2.5 MG/3ML) 0.083% nebulizer solution Take 3 mLs (2.5 mg total) by nebulization every 2 (two) hours as needed for wheezing. 04/17/20  Yes Florencia Reasons, MD  Amino Acids-Protein Hydrolys (FEEDING SUPPLEMENT, PRO-STAT SUGAR FREE 64,) LIQD Place 30 mLs into feeding tube daily.   Yes [provider]  chlorhexidine (PERIDEX) 0.12 % solution 15 mLs by Mouth Rinse route 2 (two) times daily. 04/17/20  Yes Florencia Reasons, MD  Cholecalciferol (VITAMIN D3) 10 MCG/ML LIQD Give 400 Units by tube daily. Patient taking differently: Place 400 Units into feeding tube daily. 04/17/20  Yes Florencia Reasons, MD  enoxaparin (LOVENOX) 40 MG/0.4ML injection Inject 0.4 mLs (40 mg total) into the skin daily. 04/17/20  Yes Florencia Reasons, MD  ferrous sulfate 300 (60 Fe) MG/5ML syrup Place 5 mLs (300 mg total) into feeding tube daily with breakfast. 08/28/19  Yes Ollis, Brandi L, NP  furosemide (LASIX) 20 MG  tablet Place 20 mg into feeding tube daily.   Yes [provider]  Insulin Aspart, w/Niacinamide, (FIASP) 100 UNIT/ML SOLN Inject 5 Units into the skin 3 (three) times daily.   Yes [provider]  lansoprazole (PREVACID) 30 MG capsule Place 30 mg into feeding tube daily at 12 noon.   Yes [provider]  LORazepam (ATIVAN) 0.5 MG tablet Place 1 tablet (0.5 mg total) into feeding tube every 4 (four) hours as needed for anxiety. 04/17/20  Yes Florencia Reasons, MD  Multiple Vitamin (MULTIVITAMIN) LIQD Place 15 mLs into feeding tube daily. Patient taking differently: No sig reported 08/28/19  Yes Ollis, Brandi L, NP  Nutritional Supplements  (FEEDING SUPPLEMENT, OSMOLITE 1.2 CAL,) LIQD Place 1,000 mLs into feeding tube continuous. 04/17/20  Yes Florencia Reasons, MD  sertraline (ZOLOFT) 50 MG tablet Place 1 tablet (50 mg total) into feeding tube daily. 08/28/19  Yes Ollis, Brandi L, NP  triamcinolone (KENALOG) 0.1 % Apply 1 application topically 2 (two) times daily as needed (itching).   Yes [provider]  Water For Irrigation, Sterile (FREE WATER) SOLN Place 100 mLs into feeding tube every 4 (four) hours. 04/17/20  Yes Florencia Reasons, MD  acetaminophen (TYLENOL) 160 MG/5ML solution Place 20.3 mLs (650 mg total) into feeding tube every 4 (four) hours as needed for mild pain or fever (temp > 101.5). Patient not taking: Reported on 05/25/2020 08/27/19   Donita Brooks, NP  Chlorhexidine Gluconate Cloth 2 % PADS Apply 6 each topically daily. Patient not taking: No sig reported 04/18/20   Florencia Reasons, MD  insulin aspart (NOVOLOG) 100 UNIT/ML injection Inject 0-15 Units into the skin 3 (three) times daily with meals. Patient not taking: No sig reported 04/17/20   Florencia Reasons, MD  Mouthwashes (MOUTH RINSE) LIQD solution 15 mLs by Mouth Rinse route 2 times daily at 12 noon and 4 pm. Patient not taking: No sig reported 04/17/20   Florencia Reasons, MD  Nutritional Supplements (FEEDING SUPPLEMENT, PROSOURCE TF,) liquid Place 45 mLs into feeding tube 3 (three) times daily. Patient not taking: No sig reported 04/17/20   Florencia Reasons, MD  pantoprazole sodium (PROTONIX) 40 mg/20 mL PACK Place 20 mLs (40 mg total) into feeding tube daily. Patient not taking: No sig reported 04/18/20   Florencia Reasons, MD  traZODone (DESYREL) 100 MG tablet Place 100 mg into feeding tube at bedtime.    [provider]    Physical Exam: Vitals:   05/26/20 0115 05/26/20 0120 05/26/20 0131 05/26/20 0215  BP:   97/68 100/68  Pulse:   93 97  Resp:   (!) 37 (!) 30  Temp:      TempSrc:      SpO2: 98% 100% 99% 96%  Weight:      Height:        Constitutional: Tracheostomy in place, patient is  minimally verbal.  Patient is attempted to mouth all of his words.  Patient does not appear to be in respiratory distress. Skin: no rashes, no lesions, notable poor skin turgor.   Eyes: Pupils are equally reactive to light.  No evidence of scleral icterus or conjunctival pallor.  ENMT: Slightly dry mucous membranes noted.  Posterior pharynx clear of any exudate or lesions.   Neck: normal, supple, no masses, no thyromegaly.  No evidence of jugular venous distension.   Respiratory: Bilateral lower and mid field faint rales.  Notable scattered rhonchi bilaterally.  Clear to auscultation bilaterally, no wheezing, no crackles. Normal respiratory effort.  No accessory muscle use.  Cardiovascular: Regular rate and rhythm, no murmurs / rubs / gallops. No extremity edema. 2+ pedal pulses. No carotid bruits.  Chest:   Nontender without crepitus or deformity.   Back:   Nontender without crepitus or deformity. Abdomen: PEG tube in place with scant amount of purulent drainage coming from PEG tube placement site.  Abdomen is soft and nontender.  No evidence of intra-abdominal masses.  Positive bowel sounds noted in all quadrants.   Musculoskeletal: No joint deformity upper and lower extremities. Good ROM, no contractures. Normal muscle tone.  Neurologic: CN 2-12 grossly intact. Sensation intact.  Patient moving all 4 extremities spontaneously.  Patient is following all commands.  Patient is responsive to verbal stimuli.   Psychiatric: Patient exhibits normal mood with appropriate affect.  Patient seems to possess insight as to their current situation.     Labs on Admission: I have personally reviewed following labs and imaging studies -   CBC: Recent Labs  Lab 05/25/20 1742  WBC 14.3*  NEUTROABS 10.7*  HGB 11.0*  HCT 35.4*  MCV 89.8  PLT 163   Basic Metabolic Panel: Recent Labs  Lab 05/25/20 1742  NA 138  K 4.5  CL 95*  CO2 33*  GLUCOSE 122*  BUN 29*  CREATININE 0.53*  CALCIUM 9.5    GFR: Estimated Creatinine Clearance: 81.1 mL/min (A) (by C-G formula based on SCr of 0.53 mg/dL (L)). Liver Function Tests: Recent Labs  Lab 05/25/20 1742  AST 23  ALT 25  ALKPHOS 61  BILITOT 0.5  PROT 7.9  ALBUMIN 3.0*   No results for input(s): LIPASE, AMYLASE in the last 168 hours. No results for input(s): AMMONIA in the last 168 hours. Coagulation Profile: No results for input(s): INR, PROTIME in the last 168 hours. Cardiac Enzymes: No results for input(s): CKTOTAL, CKMB, CKMBINDEX, TROPONINI in the last 168 hours. BNP (last 3 results) No results for input(s): PROBNP in the last 8760 hours. HbA1C: No results for input(s): HGBA1C in the last 72 hours. CBG: Recent Labs  Lab 05/26/20 0148  GLUCAP 108*   Lipid Profile: No results for input(s): CHOL, HDL, LDLCALC, TRIG, CHOLHDL, LDLDIRECT in the last 72 hours. Thyroid Function Tests: No results for input(s): TSH, T4TOTAL, FREET4, T3FREE, THYROIDAB in the last 72 hours. Anemia Panel: No results for input(s): VITAMINB12, FOLATE, FERRITIN, TIBC, IRON, RETICCTPCT in the last 72 hours. Urine analysis:    Component Value Date/Time   COLORURINE YELLOW 05/25/2020 0106   APPEARANCEUR CLEAR 05/25/2020 0106   LABSPEC 1.017 05/25/2020 0106   PHURINE 6.0 05/25/2020 0106   GLUCOSEU NEGATIVE 05/25/2020 0106   HGBUR SMALL (A) 05/25/2020 0106   HGBUR negative 03/23/2007 0000   BILIRUBINUR NEGATIVE 05/25/2020 0106   BILIRUBINUR neg 11/11/2016 1017   KETONESUR NEGATIVE 05/25/2020 0106   PROTEINUR NEGATIVE 05/25/2020 0106   UROBILINOGEN 0.2 11/11/2016 1017   UROBILINOGEN 0.2 11/08/2012 0915   NITRITE NEGATIVE 05/25/2020 0106   LEUKOCYTESUR NEGATIVE 05/25/2020 0106    Radiological Exams on Admission - Personally Reviewed: CT Angio Chest PE W/Cm &/Or Wo Cm  Result Date: 05/25/2020 CLINICAL DATA:  PE suspected, low/intermediate prob, positive D-dimer Shortness of breath with decreased oxygen saturation. EXAM: CT ANGIOGRAPHY  CHEST WITH CONTRAST TECHNIQUE: Multidetector CT imaging of the chest was performed using the standard protocol during bolus administration of intravenous contrast. Multiplanar CT image reconstructions and MIPs were obtained to evaluate the vascular anatomy. CONTRAST:  96mL OMNIPAQUE IOHEXOL 350 MG/ML SOLN COMPARISON:  Radiograph earlier  today. Chest CT 03/24/2020. PET CT 06/14/2019 FINDINGS: Cardiovascular: There are no filling defects within the pulmonary arteries to suggest pulmonary embolus. Subsegmental branches are not well assessed due to breathing motion artifact. Dilatation of the main pulmonary artery at 3.5 cm. Mild aortic atherosclerosis. No acute aortic findings. Left chest port with tip at the atrial caval junction. Mild cardiomegaly. No pericardial effusion. Mediastinum/Nodes: Extensive mediastinal adenopathy that has progressed from prior exam. Representative node or nodal conglomerate in the right lower paratracheal station measures 3.9 x 4.0 cm, previously 3.2 x 3.6 cm. High right mediastinal/low supraclavicular node measures 2.3 cm short axis, previously 1.7 cm by my retrospective measurement. Progressive soft tissue density presumed nodal tissue in the right anterior mediastinum anterior to the aorta and IVC. Enlarged right internal mammary node adjacent to the lower sternum measures 2.1 cm. No axillary adenopathy. Tracheostomy with tip above the carina. Minimal retained mucus in the trachea. No esophageal wall thickening. No visualized thyroid nodule. Lungs/Pleura: Extensive chronic lung disease with multifocal bronchiectasis, peripheral honeycombing, and diffuse ground-glass opacities throughout the lung parenchyma. Calcified granuloma in the left lower lobe. No evidence of acute airspace disease. No significant pleural effusion. Previous pigtail catheter in the right anterior chest has been removed. No pneumothorax. Upper Abdomen: Small retrosternal lymph node measures 6 mm, series 4, image 99.  Limited imaging of the upper abdomen without other acute finding. Musculoskeletal: No acute osseous abnormality or focal bone lesion. Thoracic spine spondylosis. Review of the MIP images confirms the above findings. IMPRESSION: 1. No pulmonary embolus. Dilatation of the main pulmonary artery consistent with pulmonary arterial hypertension. 2. Progressive thoracic adenopathy. This is greater than typically seen with reactive adenopathy, and suspicious for Hodgkin's lymphoma recurrence. 3. Extensive interstitial lung disease with pulmonary fibrosis and bronchiectasis. No acute pulmonary findings. Aortic Atherosclerosis (ICD10-I70.0). Electronically Signed   By: Keith Rake M.D.   On: 05/25/2020 20:44   DG Chest Port 1 View  Result Date: 05/25/2020 CLINICAL DATA:  Shortness of breath. EXAM: PORTABLE CHEST 1 VIEW COMPARISON:  Most recent radiograph 04/07/2020 FINDINGS: Tracheostomy tube tip at the thoracic inlet. Left chest port remains in place. Chronic elevation of right hemidiaphragm. Interstitial lung disease with peripheral predominant reticulation, chronic and unchanged. No new airspace disease. Stable cardiomegaly and mediastinal contours. No pneumothorax. IMPRESSION: 1. Stable chronic interstitial lung disease. No new abnormalities. 2. Stable cardiomegaly. 3. Tracheostomy tube unchanged with tip at the thoracic inlet. Electronically Signed   By: Keith Rake M.D.   On: 05/25/2020 18:56    EKG: Personally reviewed.  Rhythm is sinus tachycardia with heart rate of 103 bpm.  No dynamic ST segment changes appreciated.  Assessment/Plan Principal Problem:   Acute on chronic respiratory failure with hypoxia [facility])   Patient brought back to [facility] emergency department for evaluation after patient has been exhibiting copious amounts of thick yellow sputum from his tracheostomy resulting in frequent bouts of choking and shortness of breath with desaturations into the 60s.   While  patient does exhibit a mild leukocytosis, he exhibits no evidence of fever and no evidence of focal new infectious process on chest imaging.    Obtaining sputum and blood cultures as well as CRP and procalcitonin   However, I feel that patient's primary issue is the inability to deal with patient's thick copious secretions leading to intermittent mucous plugging.  We will place patient on nebulized hypertonic saline twice daily   We will order chest physiotherapy every 4 hours while awake  -arrangements  should be made for patient to receive an automated chest physiotherapy device such as a vest or some other percussion device in the outpatient setting at time of discharge.    Monitoring for further bouts of respiratory compromise.  Active Problems:   Hodgkin's lymphoma ([place])  Classic Hodgkin's lymphoma, nodular sclerosis subtype stage III diagnosed 02/2019  Patient has followed with Dr. Maylon Peppers in the past  Does not actively seem to be receiving treatment  CT imaging of the chest performed during this hospitalization reveals progressive adenopathy  Patient will need to have outpatient follow-up with oncology arranged at time of discharge    Uncontrolled type 2 diabetes mellitus with hyperglycemia, with long-term current use of insulin ([place])  Accu-Cheks every 6 hours while patient is receiving tube feeds with sliding scale insulin  Hemoglobin A1c pending    Pulmonary fibrosis ([place])   Extensive postinfection pulmonary fibrosis and bronchiectasis that developed status post prolonged extensive pulmonary infection  Beginning of this unfortunately patient will be quite debilitated from a pulmonary standpoint and likely will require oxygen for the remainder of his life  Supportive care    Bronchiectasis [facility])   As above  Dysphagia   Patient was recently as December was cleared by speech therapy to begin to eat a diet even while receiving tube feeds  Unfortunately while patient  was at his long-term acute care facility in the past 2 months patient was transitioned back to tube feeds exclusively for reasons unknown to the spouse patient  We will keep patient n.p.o. for now as we provide continuous tube feeds as per home tube feed regimen  We will ask speech therapy to reevaluate patient's ability to swallow tomorrow    GERD without esophagitis    Continue home regimen of daily PPI   Code Status:  Full code Family Communication: Spouse is at bedside who has been updated on plan of care.  Status is: Observation  The patient remains OBS appropriate and will d/c before 2 midnights.  Dispo: The patient is from: Home              Anticipated d/c is to: Home              Anticipated d/c date is: 2 days              Patient currently is not medically stable to d/c.   Difficult to place patient No        Vernelle Emerald MD [facility] Pager [phone]  If 7PM-7AM, please contact night-coverage [email or web address] Use universal [place] password for that web site. If you do not have the password, please call the hospital operator.  05/26/2020, 3:21 AM

## 2020-05-26 NOTE — ED Notes (Signed)
Pt noted to have blanchable redness to bilateral heels. Heels placed on pillow. Excoriation from scratching noted on buttocks. Dry area noted to thoracic back.Ointment applied to dry areas and buttocks.

## 2020-05-26 NOTE — ED Notes (Signed)
Pt requested to be repositioned. Pt was repositioned with two pillows and a sacral pad was placed. Patient comfortable at this time.

## 2020-05-26 NOTE — ED Notes (Signed)
ED TO INPATIENT HANDOFF REPORT  Name/Age/Gender Steve [name] 65 y.o. male  Code Status    Code Status Orders  (From admission, onward)         Start     Ordered   05/25/20 2231  Full code  Continuous        05/25/20 2230        Code Status History    Date Active Date Inactive Code Status Order ID Comments User Context   03/23/2020 1233 04/17/2020 2053 Full Code [id number]  Lupita Leash, MD Inpatient   08/28/2019 2100 10/31/2019 0223 Full Code [id number]  Delsa Grana Inpatient   07/08/2019 1852 08/28/2019 2001 Full Code [id number]  Delfin Gant, NP ED   05/17/2019 0443 05/22/2019 1728 Full Code [id number]  Pearson Grippe, MD Inpatient   11/30/2012 2139 12/02/2012 1725 Full Code [id number]  Loanne Drilling, MD Inpatient   09/05/2012 2245 09/08/2012 1509 Full Code [id number]  Loanne Drilling, MD Inpatient   07/24/2011 0116 08/01/2011 1827 Full Code [id number]  Ron Parker, MD ED   Advance Care Planning Activity      Home/SNF/Other Home  Chief Complaint Acute on chronic respiratory failure with hypoxia ([name]) [J96.21] Acute respiratory failure with hypoxia ([name]) [J96.01]  Level of Care/Admitting Diagnosis ED Disposition    ED Disposition Condition Comment   Admit  Hospital Area: [facility] [[id number]]  Level of Care: Progressive [102]  Admit to Progressive based on following criteria: RESPIRATORY PROBLEMS hypoxemic/hypercapnic respiratory failure that is responsive to NIPPV (BiPAP) or High Flow Nasal Cannula (6-80 lpm). Frequent assessment/intervention, no > Q2 hrs < Q4 hrs, to maintain oxygenation and pulmonary hygiene.  May admit patient to [facility] or [facility] if equivalent level of care is available:: No  Covid Evaluation: Confirmed COVID Negative  Diagnosis: Acute respiratory failure with hypoxia [facility]) [[id number]]  Admitting Physician: Marinda Elk [[id number]]  Attending Physician: Joycelyn Das [[id number]]  Estimated length of stay: past  midnight tomorrow  Certification:: I certify this patient will need inpatient services for at least 2 midnights       Medical History Past Medical History:  Diagnosis Date  . Abscess of muscle 08/10/2011   staph infection of right hip   . Acute on chronic respiratory failure with hypoxia ([name])   . Acute respiratory distress syndrome (ARDS) due to COVID-19 virus ([name])   . Anxiety   . COVID-19 virus infection   . Diabetes mellitus without complication ([name])   . GERD (gastroesophageal reflux disease)    rare reflux - no meds for reflux - NO PROBLEM IN PAST SEVERAL YRS  . HCAP (healthcare-associated pneumonia)   . Healthcare-associated pneumonia   . Hip dysplasia, congenital    no surgery as a child for hip dysplasia - has had bilateral hip replacements as an adult  . Hodgkin lymphoma ([name])   . Hodgkin's lymphoma ([name])   . Hypertension   . Pancreatitis   . Pneumothorax, acute   . Postoperative anemia due to acute blood loss 09/07/2012  . Septic arthritis of hip ([name]) 09/05/2012   PT'S TOTAL HIP JOINT REMOVED - ANTIBIOTIC SPACE PLACED AND PT HAS FINISHED IV ANTIBIOTICS ( PICC LINE REMOVED)  . Sleep apnea    USES CPAP    Allergies No Known Allergies  IV Location/Drains/Wounds Patient Lines/Drains/Airways Status    Active Line/Drains/Airways    Name Placement date Placement time Site Days   Implanted Port 03/29/19 Left Chest 03/29/19  1010  Chest  424   Gastrostomy/Enterostomy Gastrostomy 24 Fr. LUQ 08/22/19  1644  LUQ  278   Gastrostomy/Enterostomy PEG-jejunostomy LLQ --  --  LLQ  --   Tracheostomy Shiley Flexible 6 mm Uncuffed 05/26/20  1210  6 mm  less than 1          Labs/Imaging Results for orders placed or performed during the hospital encounter of 05/25/20 (from the past 48 hour(s))  Expectorated Sputum Assessment w Gram Stain, Rflx to Resp Cult     Status: None   Collection Time: 05/25/20  1:06 AM   Specimen: Sputum  Result Value Ref Range   Specimen Description  SPUTUM    Special Requests NONE    Sputum evaluation      Sputum specimen not acceptable for testing.  Please recollect.   CALLED TO [facility]EMY [facility] @ 0865 ON 05/26/20 C VARNER Performed at [facility], [place] [address], [place], [place] [address]    Report Status 05/26/2020 FINAL   Urinalysis, Complete w Microscopic Urine, Clean Catch     Status: Abnormal   Collection Time: 05/25/20  1:06 AM  Result Value Ref Range   Color, Urine YELLOW YELLOW   APPearance CLEAR CLEAR   Specific Gravity, Urine 1.017 1.005 - 1.030   pH 6.0 5.0 - 8.0   Glucose, UA NEGATIVE NEGATIVE mg/dL   Hgb urine dipstick SMALL (A) NEGATIVE   Bilirubin Urine NEGATIVE NEGATIVE   Ketones, ur NEGATIVE NEGATIVE mg/dL   Protein, ur NEGATIVE NEGATIVE mg/dL   Nitrite NEGATIVE NEGATIVE   Leukocytes,Ua NEGATIVE NEGATIVE   RBC / HPF 11-20 0 - 5 RBC/hpf   WBC, UA 6-10 0 - 5 WBC/hpf   Bacteria, UA RARE (A) NONE SEEN   Mucus PRESENT     Comment: Performed at [facility], [place] [address], [place], [place] [address]  Comprehensive metabolic panel     Status: Abnormal   Collection Time: 05/25/20  5:42 PM  Result Value Ref Range   Sodium 138 135 - 145 mmol/L   Potassium 4.5 3.5 - 5.1 mmol/L   Chloride 95 (L) 98 - 111 mmol/L   CO2 33 (H) 22 - 32 mmol/L   Glucose, Bld 122 (H) 70 - 99 mg/dL    Comment: Glucose reference range applies only to samples taken after fasting for at least 8 hours.   BUN 29 (H) 8 - 23 mg/dL   Creatinine, Ser 0.53 (L) 0.61 - 1.24 mg/dL   Calcium 9.5 8.9 - 10.3 mg/dL   Total Protein 7.9 6.5 - 8.1 g/dL   Albumin 3.0 (L) 3.5 - 5.0 g/dL   AST 23 15 - 41 U/L   ALT 25 0 - 44 U/L   Alkaline Phosphatase 61 38 - 126 U/L   Total Bilirubin 0.5 0.3 - 1.2 mg/dL   GFR, Estimated >60 >60 mL/min    Comment: (NOTE) Calculated using the CKD-EPI Creatinine Equation (2021)    Anion gap 10 5 - 15    Comment: Performed at [facility], [place] [address],  [place], [place] [address]  CBC with Differential     Status: Abnormal   Collection Time: 05/25/20  5:42 PM  Result Value Ref Range   WBC 14.3 (H) 4.0 - 10.5 K/uL   RBC 3.94 (L) 4.22 - 5.81 MIL/uL   Hemoglobin 11.0 (L) 13.0 - 17.0 g/dL   HCT 35.4 (L) 39.0 - 52.0 %   MCV 89.8 80.0 - 100.0 fL   MCH 27.9  26.0 - 34.0 pg   MCHC 31.1 30.0 - 36.0 g/dL   RDW 14.6 11.5 - 15.5 %   Platelets 228 150 - 400 K/uL   nRBC 0.0 0.0 - 0.2 %   Neutrophils Relative % 74 %   Neutro Abs 10.7 (H) 1.7 - 7.7 K/uL   Lymphocytes Relative 11 %   Lymphs Abs 1.5 0.7 - 4.0 K/uL   Monocytes Relative 9 %   Monocytes Absolute 1.2 (H) 0.1 - 1.0 K/uL   Eosinophils Relative 5 %   Eosinophils Absolute 0.7 (H) 0.0 - 0.5 K/uL   Basophils Relative 0 %   Basophils Absolute 0.0 0.0 - 0.1 K/uL   Immature Granulocytes 1 %   Abs Immature Granulocytes 0.08 (H) 0.00 - 0.07 K/uL    Comment: Performed at [facility], [place] [address] [place], [place] [address]  D-dimer, quantitative     Status: Abnormal   Collection Time: 05/25/20  5:42 PM  Result Value Ref Range   D-Dimer, Quant 6.05 (H) 0.00 - 0.50 ug/mL-FEU    Comment: (NOTE) At the manufacturer cut-off value of 0.5 g/mL FEU, this assay has a negative predictive value of 95-100%.This assay is intended for use in conjunction with a clinical pretest probability (PTP) assessment model to exclude pulmonary embolism (PE) and deep venous thrombosis (DVT) in outpatients suspected of PE or DVT. Results should be correlated with clinical presentation. Performed at [facility], [place] [address], [place], [place] [address]   Lactic acid, plasma     Status: None   Collection Time: 05/25/20  6:54 PM  Result Value Ref Range   Lactic Acid, Venous 0.6 0.5 - 1.9 mmol/L    Comment: Performed at [facility], [place] [address], [place], [place] [address]  C-reactive protein     Status: Abnormal   Collection Time: 05/25/20  6:54 PM   Result Value Ref Range   CRP 3.0 (H) <1.0 mg/dL    Comment: Performed at [facility], [place] [address], [place], [place] [address]  Procalcitonin - Baseline     Status: None   Collection Time: 05/25/20  6:54 PM  Result Value Ref Range   Procalcitonin <0.10 ng/mL    Comment:        Interpretation: PCT (Procalcitonin) <= 0.5 ng/mL: Systemic infection (sepsis) is not likely. Local bacterial infection is possible. (NOTE)       Sepsis PCT Algorithm           Lower Respiratory Tract                                      Infection PCT Algorithm    ----------------------------     ----------------------------         PCT < 0.25 ng/mL                PCT < 0.10 ng/mL          Strongly encourage             Strongly discourage   discontinuation of antibiotics    initiation of antibiotics    ----------------------------     -----------------------------       PCT 0.25 - 0.50 ng/mL            PCT 0.10 - 0.25 ng/mL               OR       >  80% decrease in PCT            Discourage initiation of                                            antibiotics      Encourage discontinuation           of antibiotics    ----------------------------     -----------------------------         PCT >= 0.50 ng/mL              PCT 0.26 - 0.50 ng/mL               AND        <80% decrease in PCT             Encourage initiation of                                             antibiotics       Encourage continuation           of antibiotics    ----------------------------     -----------------------------        PCT >= 0.50 ng/mL                  PCT > 0.50 ng/mL               AND         increase in PCT                  Strongly encourage                                      initiation of antibiotics    Strongly encourage escalation           of antibiotics                                     -----------------------------                                           PCT <= 0.25 ng/mL                                                  OR                                        > 80% decrease in PCT                                      Discontinue / Do not initiate  antibiotics  Performed at [facility], [place] [address] [place], [place] [address]   Resp Panel by RT-PCR (Flu A&B, Covid) Nasopharyngeal Swab     Status: None   Collection Time: 05/25/20  7:28 PM   Specimen: Nasopharyngeal Swab; Nasopharyngeal(NP) swabs in vial transport medium  Result Value Ref Range   SARS Coronavirus 2 by RT PCR NEGATIVE NEGATIVE    Comment: (NOTE) SARS-CoV-2 target nucleic acids are NOT DETECTED.  The SARS-CoV-2 RNA is generally detectable in upper respiratory specimens during the acute phase of infection. The lowest concentration of SARS-CoV-2 viral copies this assay can detect is 138 copies/mL. A negative result does not preclude SARS-Cov-2 infection and should not be used as the sole basis for treatment or other patient management decisions. A negative result may occur with  improper specimen collection/handling, submission of specimen other than nasopharyngeal swab, presence of viral mutation(s) within the areas targeted by this assay, and inadequate number of viral copies(<138 copies/mL). A negative result must be combined with clinical observations, patient history, and epidemiological information. The expected result is Negative.  Fact Sheet for Patients:  EntrepreneurPulse.com.au  Fact Sheet for Healthcare Providers:  IncredibleEmployment.be  This test is no t yet approved or cleared by the [place] FDA and  has been authorized for detection and/or diagnosis of SARS-CoV-2 by FDA under an Emergency Use Authorization (EUA). This EUA will remain  in effect (meaning this test can be used) for the duration of the COVID-19 declaration under Section 564(b)(1) of the Act, 21 U.S.C.section  360bbb-3(b)(1), unless the authorization is terminated  or revoked sooner.       Influenza A by PCR NEGATIVE NEGATIVE   Influenza B by PCR NEGATIVE NEGATIVE    Comment: (NOTE) The Xpert Xpress SARS-CoV-2/FLU/RSV plus assay is intended as an aid in the diagnosis of influenza from Nasopharyngeal swab specimens and should not be used as a sole basis for treatment. Nasal washings and aspirates are unacceptable for Xpert Xpress SARS-CoV-2/FLU/RSV testing.  Fact Sheet for Patients: EntrepreneurPulse.com.au  Fact Sheet for Healthcare Providers: IncredibleEmployment.be  This test is not yet approved or cleared by the [place] FDA and has been authorized for detection and/or diagnosis of SARS-CoV-2 by FDA under an Emergency Use Authorization (EUA). This EUA will remain in effect (meaning this test can be used) for the duration of the COVID-19 declaration under Section 564(b)(1) of the Act, 21 U.S.C. section 360bbb-3(b)(1), unless the authorization is terminated or revoked.  Performed at [facility], [place] [address], [place], [place] [address]   Lactic acid, plasma     Status: None   Collection Time: 05/25/20  8:54 PM  Result Value Ref Range   Lactic Acid, Venous 0.6 0.5 - 1.9 mmol/L    Comment: Performed at [facility], [place] [address], [place], [place] [address]  Blood gas, arterial     Status: Abnormal   Collection Time: 05/25/20 10:35 PM  Result Value Ref Range   FIO2 60.00    pH, Arterial 7.343 (L) 7.350 - 7.450   pCO2 arterial 59.1 (H) 32.0 - 48.0 mmHg   pO2, Arterial 133 (H) 83.0 - 108.0 mmHg   Bicarbonate 31.3 (H) 20.0 - 28.0 mmol/L   Acid-Base Excess 4.9 (H) 0.0 - 2.0 mmol/L   O2 Saturation 98.3 %   Patient temperature 99.0    Collection site RIGHT RADIAL    Drawn by [id number]    Allens test (pass/fail) PASS PASS    Comment: Performed  at [facility], [place] [address],  [place], [place] [address]  CBG monitoring, ED     Status: Abnormal   Collection Time: 05/26/20  1:48 AM  Result Value Ref Range   Glucose-Capillary 108 (H) 70 - 99 mg/dL    Comment: Glucose reference range applies only to samples taken after fasting for at least 8 hours.   Comment 1 Notify RN   Culture, Respiratory w Gram Stain     Status: None (Preliminary result)   Collection Time: 05/26/20  3:20 AM   Specimen: Tracheal Aspirate; Respiratory  Result Value Ref Range   Specimen Description      TRACHEAL ASPIRATE Performed at [place] [address], [place], [place] [address]    Special Requests      NONE Performed at [facility], [place] [address] [place], [place] [address]    Gram Stain      RARE WBC PRESENT, PREDOMINANTLY MONONUCLEAR NO ORGANISMS SEEN Performed at [facility], [place] [address], [place], [place] [address]    Culture PENDING    Report Status PENDING   CBG monitoring, ED     Status: Abnormal   Collection Time: 05/26/20  5:34 AM  Result Value Ref Range   Glucose-Capillary 100 (H) 70 - 99 mg/dL    Comment: Glucose reference range applies only to samples taken after fasting for at least 8 hours.  Comprehensive metabolic panel     Status: Abnormal   Collection Time: 05/26/20  5:35 AM  Result Value Ref Range   Sodium 137 135 - 145 mmol/L   Potassium 3.9 3.5 - 5.1 mmol/L   Chloride 99 98 - 111 mmol/L   CO2 30 22 - 32 mmol/L   Glucose, Bld 108 (H) 70 - 99 mg/dL    Comment: Glucose reference range applies only to samples taken after fasting for at least 8 hours.   BUN 20 8 - 23 mg/dL   Creatinine, Ser 0.56 (L) 0.61 - 1.24 mg/dL   Calcium 9.1 8.9 - 10.3 mg/dL   Total Protein 7.0 6.5 - 8.1 g/dL   Albumin 2.6 (L) 3.5 - 5.0 g/dL   AST 20 15 - 41 U/L   ALT 22 0 - 44 U/L   Alkaline Phosphatase 49 38 - 126 U/L   Total Bilirubin 0.7 0.3 - 1.2 mg/dL   GFR, Estimated >60 >60 mL/min    Comment: (NOTE) Calculated using the  CKD-EPI Creatinine Equation (2021)    Anion gap 8 5 - 15    Comment: Performed at [facility], [place] [address], [place], [place] [address]  Magnesium     Status: None   Collection Time: 05/26/20  5:35 AM  Result Value Ref Range   Magnesium 1.7 1.7 - 2.4 mg/dL    Comment: Performed at [facility], [place] [address], [place], [place] [address]  CBC WITH DIFFERENTIAL     Status: Abnormal   Collection Time: 05/26/20  5:35 AM  Result Value Ref Range   WBC 12.5 (H) 4.0 - 10.5 K/uL   RBC 3.51 (L) 4.22 - 5.81 MIL/uL   Hemoglobin 9.8 (L) 13.0 - 17.0 g/dL   HCT 32.2 (L) 39.0 - 52.0 %   MCV 91.7 80.0 - 100.0 fL   MCH 27.9 26.0 - 34.0 pg   MCHC 30.4 30.0 - 36.0 g/dL   RDW 14.6 11.5 - 15.5 %   Platelets 218 150 - 400 K/uL  nRBC 0.0 0.0 - 0.2 %   Neutrophils Relative % 64 %   Neutro Abs 8.0 (H) 1.7 - 7.7 K/uL   Lymphocytes Relative 19 %   Lymphs Abs 2.4 0.7 - 4.0 K/uL   Monocytes Relative 10 %   Monocytes Absolute 1.3 (H) 0.1 - 1.0 K/uL   Eosinophils Relative 6 %   Eosinophils Absolute 0.7 (H) 0.0 - 0.5 K/uL   Basophils Relative 0 %   Basophils Absolute 0.1 0.0 - 0.1 K/uL   Immature Granulocytes 1 %   Abs Immature Granulocytes 0.06 0.00 - 0.07 K/uL    Comment: Performed at [facility], [place] [address] [place], [place] [address]  Protime-INR     Status: Abnormal   Collection Time: 05/26/20  5:35 AM  Result Value Ref Range   Prothrombin Time 15.3 (H) 11.4 - 15.2 seconds   INR 1.3 (H) 0.8 - 1.2    Comment: (NOTE) INR goal varies based on device and disease states. Performed at [facility], [place] [address] [place], [place] [address]   APTT     Status: Abnormal   Collection Time: 05/26/20  5:35 AM  Result Value Ref Range   aPTT 37 (H) 24 - 36 seconds    Comment:        IF BASELINE aPTT IS ELEVATED, SUGGEST PATIENT RISK ASSESSMENT BE USED TO DETERMINE APPROPRIATE ANTICOAGULANT THERAPY. Performed at [facility], [place] [address] [place], [place] [address]   Respiratory (~20 pathogens) panel by PCR     Status: None   Collection Time: 05/26/20  5:38 AM   Specimen: Tracheal Aspirate; Respiratory  Result Value Ref Range   Adenovirus NOT DETECTED NOT DETECTED   Coronavirus 229E NOT DETECTED NOT DETECTED    Comment: (NOTE) The Coronavirus on the Respiratory Panel, DOES NOT test for the novel  Coronavirus (2019 nCoV)    Coronavirus HKU1 NOT DETECTED NOT DETECTED   Coronavirus NL63 NOT DETECTED NOT DETECTED   Coronavirus OC43 NOT DETECTED NOT DETECTED   Metapneumovirus NOT DETECTED NOT DETECTED   Rhinovirus / Enterovirus NOT DETECTED NOT DETECTED   Influenza A NOT DETECTED NOT DETECTED   Influenza B NOT DETECTED NOT DETECTED   Parainfluenza Virus 1 NOT DETECTED NOT DETECTED   Parainfluenza Virus 2 NOT DETECTED NOT DETECTED   Parainfluenza Virus 3 NOT DETECTED NOT DETECTED   Parainfluenza Virus 4 NOT DETECTED NOT DETECTED   Respiratory Syncytial Virus NOT DETECTED NOT DETECTED   Bordetella pertussis NOT DETECTED NOT DETECTED   Bordetella Parapertussis NOT DETECTED NOT DETECTED   Chlamydophila pneumoniae NOT DETECTED NOT DETECTED   Mycoplasma pneumoniae NOT DETECTED NOT DETECTED    Comment: Performed at [facility], [place] [address] [place], [place] [address]  CBG monitoring, ED     Status: Abnormal   Collection Time: 05/26/20 12:30 PM  Result Value Ref Range   Glucose-Capillary 112 (H) 70 - 99 mg/dL    Comment: Glucose reference range applies only to samples taken after fasting for at least 8 hours.  CBG monitoring, ED     Status: None   Collection Time: 05/26/20  2:49 PM  Result Value Ref Range   Glucose-Capillary 95 70 - 99 mg/dL    Comment: Glucose reference range applies only to samples taken after fasting for at least 8 hours.   CT Angio Chest PE W/Cm &/Or Wo Cm  Result Date: 05/25/2020 CLINICAL DATA:  PE suspected, low/intermediate prob, positive D-dimer  Shortness of breath with decreased oxygen saturation. EXAM: CT ANGIOGRAPHY CHEST WITH CONTRAST TECHNIQUE: Multidetector CT imaging of the chest was performed using the standard protocol during bolus administration of intravenous contrast. Multiplanar CT image reconstructions and MIPs were obtained to evaluate the vascular anatomy. CONTRAST:  1m OMNIPAQUE IOHEXOL 350 MG/ML SOLN COMPARISON:  Radiograph earlier today. Chest CT 03/24/2020. PET CT 06/14/2019 FINDINGS: Cardiovascular: There are no filling defects within the pulmonary arteries to suggest pulmonary embolus. Subsegmental branches are not well assessed due to breathing motion artifact. Dilatation of the main pulmonary artery at 3.5 cm. Mild aortic atherosclerosis. No acute aortic findings. Left chest port with tip at the atrial caval junction. Mild cardiomegaly. No pericardial effusion. Mediastinum/Nodes: Extensive mediastinal adenopathy that has progressed from prior exam. Representative node or nodal conglomerate in the right lower paratracheal station measures 3.9 x 4.0 cm, previously 3.2 x 3.6 cm. High right mediastinal/low supraclavicular node measures 2.3 cm short axis, previously 1.7 cm by my retrospective measurement. Progressive soft tissue density presumed nodal tissue in the right anterior mediastinum anterior to the aorta and IVC. Enlarged right internal mammary node adjacent to the lower sternum measures 2.1 cm. No axillary adenopathy. Tracheostomy with tip above the carina. Minimal retained mucus in the trachea. No esophageal wall thickening. No visualized thyroid nodule. Lungs/Pleura: Extensive chronic lung disease with multifocal bronchiectasis, peripheral honeycombing, and diffuse ground-glass opacities throughout the lung parenchyma. Calcified granuloma in the left lower lobe. No evidence of acute airspace disease. No significant pleural effusion. Previous pigtail catheter in the right anterior chest has been removed. No pneumothorax.  Upper Abdomen: Small retrosternal lymph node measures 6 mm, series 4, image 99. Limited imaging of the upper abdomen without other acute finding. Musculoskeletal: No acute osseous abnormality or focal bone lesion. Thoracic spine spondylosis. Review of the MIP images confirms the above findings. IMPRESSION: 1. No pulmonary embolus. Dilatation of the main pulmonary artery consistent with pulmonary arterial hypertension. 2. Progressive thoracic adenopathy. This is greater than typically seen with reactive adenopathy, and suspicious for Hodgkin's lymphoma recurrence. 3. Extensive interstitial lung disease with pulmonary fibrosis and bronchiectasis. No acute pulmonary findings. Aortic Atherosclerosis (ICD10-I70.0). Electronically Signed   By: MKeith RakeM.D.   On: 05/25/2020 20:44   DG Chest Port 1 View  Result Date: 05/25/2020 CLINICAL DATA:  Shortness of breath. EXAM: PORTABLE CHEST 1 VIEW COMPARISON:  Most recent radiograph 04/07/2020 FINDINGS: Tracheostomy tube tip at the thoracic inlet. Left chest port remains in place. Chronic elevation of right hemidiaphragm. Interstitial lung disease with peripheral predominant reticulation, chronic and unchanged. No new airspace disease. Stable cardiomegaly and mediastinal contours. No pneumothorax. IMPRESSION: 1. Stable chronic interstitial lung disease. No new abnormalities. 2. Stable cardiomegaly. 3. Tracheostomy tube unchanged with tip at the thoracic inlet. Electronically Signed   By: MKeith RakeM.D.   On: 05/25/2020 18:56    Pending Labs Unresulted Labs (From admission, onward)          Start     Ordered   05/27/20 0500  CBC  Tomorrow morning,   R        05/26/20 1129   05/27/20 0500  Comprehensive metabolic panel  Tomorrow morning,   R        05/26/20 1129   05/27/20 0500  Magnesium  Tomorrow morning,   R        05/26/20 1129   05/27/20 0500  Phosphorus  Tomorrow morning,   R        05/26/20 1129  05/27/20 0500  Procalcitonin  Daily,   R       05/26/20 1251   05/26/20 1248  Procalcitonin - Baseline  ONCE - STAT,   STAT        05/26/20 1251   05/26/20 1239  Culture, Respiratory w Gram Stain  Once,   STAT        05/26/20 1251   05/25/20 2231  Hemoglobin A1c  Once,   STAT       Comments: To assess prior glycemic control    05/25/20 2230   05/25/20 1854  Culture, blood (routine x 2)  BLOOD CULTURE X 2,   STAT      05/25/20 1853          Vitals/Pain Today's Vitals   05/26/20 0954 05/26/20 1000 05/26/20 1055 05/26/20 1215  BP:  95/62 94/60 101/71  Pulse: 98 (!) 102 (!) 101 99  Resp: (!) 38 (!) 36 (!) 25 (!) 33  Temp:      TempSrc:      SpO2: 94% 93% 91% 95%  Weight:      Height:      PainSc:        Isolation Precautions No active isolations  Medications Medications  pneumococcal 23 valent vaccine (PNEUMOVAX-23) injection 0.5 mL (has no administration in time range)  influenza vac split quadrivalent PF (FLUARIX) injection 0.5 mL (has no administration in time range)  LORazepam (ATIVAN) tablet 0.5 mg (has no administration in time range)  sertraline (ZOLOFT) tablet 50 mg (50 mg Per Tube Given 05/26/20 1028)  enoxaparin (LOVENOX) injection 40 mg (40 mg Subcutaneous Given 05/26/20 1028)  ferrous sulfate 300 (60 Fe) MG/5ML syrup 300 mg (300 mg Per Tube Given 05/26/20 0749)  free water 100 mL (100 mLs Per Tube Given 05/26/20 1300)  chlorhexidine gluconate (MEDLINE KIT) (PERIDEX) 0.12 % solution 15 mL (15 mLs Mouth Rinse Patient Refused/Not Given 05/26/20 1028)  ipratropium-albuterol (DUONEB) 0.5-2.5 (3) MG/3ML nebulizer solution 3 mL (has no administration in time range)  acetaminophen (TYLENOL) tablet 650 mg (has no administration in time range)    Or  acetaminophen (TYLENOL) suppository 650 mg (has no administration in time range)  polyethylene glycol (MIRALAX / GLYCOLAX) packet 17 g (has no administration in time range)  ondansetron (ZOFRAN) tablet 4 mg (has no administration in time range)    Or  ondansetron  (ZOFRAN) injection 4 mg (has no administration in time range)  lactated ringers infusion ( Intravenous New Bag/Given 05/26/20 1057)  insulin aspart (novoLOG) injection 0-15 Units (0 Units Subcutaneous Not Given 05/26/20 1449)  sodium chloride HYPERTONIC 3 % nebulizer solution 4 mL (4 mLs Nebulization Not Given 05/26/20 0820)  ipratropium-albuterol (DUONEB) 0.5-2.5 (3) MG/3ML nebulizer solution 3 mL (3 mLs Nebulization Given 05/26/20 0820)  feeding supplement (OSMOLITE 1.2 CAL) liquid 1,000 mL (1,000 mLs Per Tube Not Given 05/26/20 1038)  pantoprazole sodium (PROTONIX) 40 mg/20 mL oral suspension 40 mg (40 mg Per Tube Given 05/26/20 1029)  levofloxacin (LEVAQUIN) 25 MG/ML solution 750 mg (750 mg Per Tube Given 05/26/20 1453)  vancomycin (VANCOREADY) IVPB 1500 mg/300 mL (0 mg Intravenous Stopped 05/25/20 2322)  sodium chloride 0.9 % bolus 1,000 mL (0 mLs Intravenous Stopped 05/25/20 2322)  iohexol (OMNIPAQUE) 350 MG/ML injection 100 mL (80 mLs Intravenous Contrast Given 05/25/20 2007)  ceFEPIme (MAXIPIME) 2 g in sodium chloride 0.9 % 100 mL IVPB (0 g Intravenous Stopped 05/25/20 2054)    Mobility non-ambulatory

## 2020-05-26 NOTE — Progress Notes (Signed)
Patients trach changed out by CCM/NP with no complications. Pt went from and #7 Protex to and #6 Shiley uncuffed. Patient has good color change on ez-cap and equal BS.

## 2020-05-26 NOTE — Progress Notes (Signed)
Clinical/Bedside Swallow Evaluation Patient Details  Name: Steve [name] MRN: [id number] Date of Birth: [birth year]  Today's Date: 05/26/2020 Time: SLP Start Time (ACUTE ONLY): 0907 SLP Stop Time (ACUTE ONLY): 5631 SLP Time Calculation (min) (ACUTE ONLY): 60 min  Past Medical History:  Past Medical History:  Diagnosis Date   Abscess of muscle 08/10/2011   staph infection of right hip    Acute on chronic respiratory failure with hypoxia ([name])    Acute respiratory distress syndrome (ARDS) due to COVID-19 virus ([place])    Anxiety    COVID-19 virus infection    Diabetes mellitus without complication ([name])    GERD (gastroesophageal reflux disease)    rare reflux - no meds for reflux - NO PROBLEM IN PAST SEVERAL YRS   HCAP (healthcare-associated pneumonia)    Healthcare-associated pneumonia    Hip dysplasia, congenital    no surgery as a child for hip dysplasia - has had bilateral hip replacements as an adult   Hodgkin lymphoma ([place])    Hodgkin's lymphoma ([place])    Hypertension    Pancreatitis    Pneumothorax, acute    Postoperative anemia due to acute blood loss 09/07/2012   Septic arthritis of hip ([place]) 09/05/2012   PT'S TOTAL HIP JOINT REMOVED - ANTIBIOTIC SPACE PLACED AND PT HAS FINISHED IV ANTIBIOTICS ( PICC LINE REMOVED)   Sleep apnea    USES CPAP   Past Surgical History:  Past Surgical History:  Procedure Laterality Date   COLONOSCOPY  04/26/2007   HERNIA REPAIR     inguinal hernia x3   IR GASTROSTOMY TUBE MOD SED  08/22/2019   IR IMAGING GUIDED PORT INSERTION  03/29/2019   JOINT REPLACEMENT  2002 & 2007   bilateral hip replacement   LYMPH NODE BIOPSY Right 03/20/2019   Procedure: DEEP RIGHT SUPRACLAVICULAR LYMPH NODE EXCISION;  Surgeon: Fanny Skates, MD;  Location: [place];  Service: General;  Laterality: Right;   MULTIPLE EXTRACTIONS WITH ALVEOLOPLASTY  07/28/2011   Procedure: MULTIPLE EXTRACION WITH ALVEOLOPLASTY;  Surgeon: Lenn Cal, DDS;   Location: [facility];  Service: Oral Surgery;  Laterality: N/A;  Extraction of tooth #'s 2,3,4,5,6,11,12,13,15,19,22 with alveoloplasty.   shoulder repair - right for separation of shoulder     TEE WITHOUT CARDIOVERSION  07/29/2011   Procedure: TRANSESOPHAGEAL ECHOCARDIOGRAM (TEE);  Surgeon: Josue Hector, MD;  Location: [facility];  Service: Cardiovascular;  Laterality: N/A;   TOTAL HIP REVISION Right 09/05/2012   Procedure: RIGHT HIP RESECTION ARTHROPLASTY WITH ANTIBIOTIC SPACERS;  Surgeon: [name], MD;  Location: [facility];  Service: Orthopedics;  Laterality: Right;   TOTAL HIP REVISION Right 11/30/2012   Procedure: RIGHT TOTAL HIP ARTHROPLASTY REIMPLANTATION;  Surgeon: [name], MD;  Location: [facility];  Service: Orthopedics;  Laterality: Right;   HPI:  pt is a 65 yo male with h/o Hodgkin's lymphoma, s/p COVID requiring hospital admit from 03/17/2020 to1/10/2020.  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  Swallow and PMSV ordered received.  Pt reports increased issues with thickness of secretions and states hse has been NPO since mid January.   Assessment / Plan / Recommendation Clinical Impression  Patient known to this SLP from prior admission when he was in [facility] with [place].  His oxygen saturation declines and RR increases even with sitting or minimal movement.  No focal CN deficits apparent and pt able to acheive breathy voice around trach with  significant effort.  SLP phoned SLP from [facility] to obtain information regarding prior status.  Erin advised pt underwent FEES study *off Tbar with PMSV* 04/22/2020 with recommendation of soft/thin diet = no significant findings.  However pt then began desating and not tolerating PMSV thus he was made NPO.  ENT assessed for airway patency with no deficits noted per [facility] SLP>  By January 17th, pt required 80L of oxygen per SLP.  He advises he has maintained NPO at home but  does perform oral care am and pm.  SLP did not try PMSV given pt has 2 at home and his poor tolerance, desaturation with use.  Ice chips provided and pt able to consume them without s/s of aspiration.  Pt does require frequent rest breaks during activity due to his incr RR and desaturation with movement, etc.  RN suctioned pt and his saturations had remained elevated with activity following suctioning.  Pt advised his trach cuff was to remain inflated at [facility].  Recommend ice chips, rinse and expectorate water and MBS while pt in house for po readiness - hopefully with stabilizqation of resp status.  Using teach back and written instructions, pt agreeable to plan.   Advise follow up with CCS while in house to establish definitive guidelines for pt's trach.  Pt was to go to trach clinic on Friday 2/11 but did not have oxygen needed to attend. Reviewed importance of oral care, especially before ice chips and swallow on onset of exhalation for airway protection. SLP Visit Diagnosis: Dysphagia, unspecified (R13.10)    Aspiration Risk  Moderate aspiration risk    Diet Recommendation  (ice chips after oral care)   Liquid Administration via: Other (Comment) (pt may swish and expectorate with water) Medication Administration: Via alternative means Supervision: Patient able to self feed Compensations: Slow rate;Small sips/bites Postural Changes: Seated upright at 90 degrees    Other  Recommendations Recommended Consults: Other (Comment) (CCS for trach management) Oral Care Recommendations: Oral care QID;Oral care prior to ice chip/H20 Other Recommendations: Have oral suction available   Follow up Recommendations Other (comment) (TBD)      Frequency and Duration min 2x/week          Prognosis Prognosis for Safe Diet Advancement: Fair Barriers to Reach Goals: Other (Comment) (pulmonary status post-COVID)      Swallow Study   General Date of Onset: 03/17/20 HPI: pt is a 65 yo male with h/o  Hodgkin's lymphoma, s/p COVID requiring hospital admit from 03/17/2020 to1/10/2020.  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  Swallow and PMSV ordered received.  Pt reports increased issues with thickness of secretions and states hse has been NPO since mid January. Type of Study: Bedside Swallow Evaluation Previous Swallow Assessment: MBS 04/10/2020 puree/thin advised, Diet Prior to this Study: NPO Temperature Spikes Noted: No Respiratory Status: Trach Collar (10 Liters) Behavior/Cognition: Alert;Cooperative;Pleasant mood Oral Cavity Assessment: Within Functional Limits Oral Care Completed by SLP: Yes (SLP had pt brush his gums and tongue and reviewed importance of oral care) Oral Cavity - Dentition: Edentulous Vision: Functional for self-feeding Self-Feeding Abilities: Able to feed self Patient Positioning: Upright in bed Baseline Vocal Quality: Breathy (pt able to voice around trach) Volitional Swallow: Able to elicit    Oral/Motor/Sensory Function Overall Oral Motor/Sensory Function: Within functional limits   Ice Chips Ice chips: Within functional limits Presentation: Self Fed;Spoon Other Comments: pt instructed to swallow upon initiation of exhalation  Thin Liquid Thin Liquid: Not tested    Nectar Thick Nectar Thick Liquid: Not tested   Honey Thick Honey Thick Liquid: Not tested   Puree Puree: Not tested   Solid    Kathleen Lime, MS [facility] SLP Acute Rehab Services Office [phone] Pager [phone]  Solid: Not tested      [name] 05/26/2020,10:52 AM

## 2020-05-26 NOTE — ED Notes (Signed)
Pt suctioned at this time. Pt repositioned in bed. Wife remains at bedside.

## 2020-05-26 NOTE — ED Notes (Signed)
[facility] made aware that tube feed pump (Kangroo) needed for tube feeding

## 2020-05-26 NOTE — Plan of Care (Signed)
  Problem: Activity: Goal: Ability to tolerate increased activity will improve Outcome: Progressing   Problem: Respiratory: Goal: Ability to maintain adequate ventilation will improve Outcome: Progressing   Problem: Education: Goal: Knowledge of General Education information will improve Description: Including pain rating scale, medication(s)/side effects and non-pharmacologic comfort measures Outcome: Completed/Met

## 2020-05-27 DIAGNOSIS — J9621 Acute and chronic respiratory failure with hypoxia: Secondary | ICD-10-CM | POA: Diagnosis not present

## 2020-05-27 LAB — CBC
HCT: 29.3 % — ABNORMAL LOW (ref 39.0–52.0)
Hemoglobin: 9 g/dL — ABNORMAL LOW (ref 13.0–17.0)
MCH: 28 pg (ref 26.0–34.0)
MCHC: 30.7 g/dL (ref 30.0–36.0)
MCV: 91.3 fL (ref 80.0–100.0)
Platelets: 196 10*3/uL (ref 150–400)
RBC: 3.21 MIL/uL — ABNORMAL LOW (ref 4.22–5.81)
RDW: 14.5 % (ref 11.5–15.5)
WBC: 9.6 10*3/uL (ref 4.0–10.5)
nRBC: 0 % (ref 0.0–0.2)

## 2020-05-27 LAB — COMPREHENSIVE METABOLIC PANEL
ALT: 19 U/L (ref 0–44)
AST: 17 U/L (ref 15–41)
Albumin: 2.4 g/dL — ABNORMAL LOW (ref 3.5–5.0)
Alkaline Phosphatase: 53 U/L (ref 38–126)
Anion gap: 7 (ref 5–15)
BUN: 17 mg/dL (ref 8–23)
CO2: 32 mmol/L (ref 22–32)
Calcium: 8.4 mg/dL — ABNORMAL LOW (ref 8.9–10.3)
Chloride: 97 mmol/L — ABNORMAL LOW (ref 98–111)
Creatinine, Ser: 0.62 mg/dL (ref 0.61–1.24)
GFR, Estimated: >60 mL/min (ref >60)
Glucose, Bld: 161 mg/dL — ABNORMAL HIGH (ref 70–99)
Potassium: 3.8 mmol/L (ref 3.5–5.1)
Sodium: 136 mmol/L (ref 135–145)
Total Bilirubin: 0.5 mg/dL (ref 0.3–1.2)
Total Protein: 6.5 g/dL (ref 6.5–8.1)

## 2020-05-27 LAB — MAGNESIUM: Magnesium: 1.7 mg/dL (ref 1.7–2.4)

## 2020-05-27 LAB — HEMOGLOBIN A1C
Hgb A1c MFr Bld: 5.8 % — ABNORMAL HIGH (ref 4.8–5.6)
Mean Plasma Glucose: 120 mg/dL

## 2020-05-27 LAB — PHOSPHORUS: Phosphorus: 2.6 mg/dL (ref 2.5–4.6)

## 2020-05-27 LAB — GLUCOSE, CAPILLARY
Glucose-Capillary: 130 mg/dL — ABNORMAL HIGH (ref 70–99)
Glucose-Capillary: 145 mg/dL — ABNORMAL HIGH (ref 70–99)
Glucose-Capillary: 147 mg/dL — ABNORMAL HIGH (ref 70–99)
Glucose-Capillary: 151 mg/dL — ABNORMAL HIGH (ref 70–99)

## 2020-05-27 LAB — PROCALCITONIN: Procalcitonin: <0.1 ng/mL

## 2020-05-27 MED ORDER — PROSOURCE TF PO LIQD
45.0000 mL | Freq: Three times a day (TID) | ORAL | Status: DC
  Administered 2020-05-27 – 2020-05-29 (×7): 45 mL
  Filled 2020-05-27 (×8): qty 45

## 2020-05-27 MED ORDER — OSMOLITE 1.2 CAL PO LIQD
1000.0000 mL | ORAL | Status: DC
  Administered 2020-05-27 – 2020-05-29 (×3): 1000 mL
  Filled 2020-05-27 (×5): qty 1000

## 2020-05-27 MED ORDER — OSMOLITE 1.2 CAL PO LIQD: 1000.0000 mL | ORAL | Status: DC

## 2020-05-27 NOTE — Plan of Care (Signed)
  Problem: Activity: Goal: Risk for activity intolerance will decrease Outcome: Progressing   Problem: Nutrition: Goal: Adequate nutrition will be maintained Outcome: Progressing   Problem: Elimination: Goal: Will not experience complications related to bowel motility Outcome: Progressing Goal: Will not experience complications related to urinary retention Outcome: Progressing   

## 2020-05-27 NOTE — Evaluation (Signed)
[facility] Speaking Valve - Evaluation Patient Details  Name: Steve [name] MRN: [id number] Date of Birth: Jan 09, 1956  Today's Date: 05/27/2020 Time: 0907-0925 SLP Time Calculation (min) (ACUTE ONLY): 18 min  Past Medical History:  Past Medical History:  Diagnosis Date  . Abscess of muscle 08/10/2011   staph infection of right hip   . Acute on chronic respiratory failure with hypoxia ([place])   . Acute respiratory distress syndrome (ARDS) due to COVID-19 virus ([place])   . Anxiety   . COVID-19 virus infection   . Diabetes mellitus without complication ([place])   . GERD (gastroesophageal reflux disease)    rare reflux - no meds for reflux - NO PROBLEM IN PAST SEVERAL YRS  . HCAP (healthcare-associated pneumonia)   . Healthcare-associated pneumonia   . Hip dysplasia, congenital    no surgery as a child for hip dysplasia - has had bilateral hip replacements as an adult  . Hodgkin lymphoma ([place])   . Hodgkin's lymphoma ([place])   . Hypertension   . Pancreatitis   . Pneumothorax, acute   . Postoperative anemia due to acute blood loss 09/07/2012  . Septic arthritis of hip ([place]) 09/05/2012   PT'S TOTAL HIP JOINT REMOVED - ANTIBIOTIC SPACE PLACED AND PT HAS FINISHED IV ANTIBIOTICS ( PICC LINE REMOVED)  . Sleep apnea    USES CPAP   Past Surgical History:  Past Surgical History:  Procedure Laterality Date  . COLONOSCOPY  04/26/2007  . HERNIA REPAIR     inguinal hernia x3  . IR GASTROSTOMY TUBE MOD SED  08/22/2019  . IR IMAGING GUIDED PORT INSERTION  03/29/2019  . JOINT REPLACEMENT  2002 & 2007   bilateral hip replacement  . LYMPH NODE BIOPSY Right 03/20/2019   Procedure: DEEP RIGHT SUPRACLAVICULAR LYMPH NODE EXCISION;  Surgeon: Fanny Skates, MD;  Location: [place];  Service: General;  Laterality: Right;  . MULTIPLE EXTRACTIONS WITH ALVEOLOPLASTY  07/28/2011   Procedure: MULTIPLE EXTRACION WITH ALVEOLOPLASTY;  Surgeon: Lenn Cal, DDS;  [facility];  Service: Oral  Surgery;  Laterality: N/A;  Extraction of tooth #'s 2,3,4,5,6,11,12,13,15,19,22 with alveoloplasty.  . shoulder repair - right for separation of shoulder    . TEE WITHOUT CARDIOVERSION  07/29/2011   Procedure: TRANSESOPHAGEAL ECHOCARDIOGRAM (TEE);  Surgeon: Josue Hector, MD;  Location: [place];  Service: Cardiovascular;  Laterality: N/A;  . TOTAL HIP REVISION Right 09/05/2012   Procedure: RIGHT HIP RESECTION ARTHROPLASTY WITH ANTIBIOTIC SPACERS;  Surgeon: [name], MD;  [facility];  Service: Orthopedics;  Laterality: Right;  . TOTAL HIP REVISION Right 11/30/2012   Procedure: RIGHT TOTAL HIP ARTHROPLASTY REIMPLANTATION;  Surgeon: [name], MD;  [facility];  Service: Orthopedics;  Laterality: Right;   HPI:  pt is a 65 yo male with h/o Hodgkin's lymphoma, s/p COVID requiring hospital admit from 03/17/2020 to1/10/2020.  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  Swallow and PMSV ordered received.  Pt reports increased issues with thickness of secretions and states he has been NPO since mid January.   Assessment / Plan / Recommendation Clinical Impression  Pt today fully alert and very cooperative/pleasant.  His trach was changed to a number six cuffless yesterday by CCM and thus PMSV trials commenced.  Pt's spouse brought in two valves pt already had from home.  Repositioned pt for optimal airway with PMSV trials.  At baseline, pt's RR was 18, oxygen  saturation 94 and HR 86.  Able to produce breathy phonation with occlusion of trach but does have large stoma with loss of air around his trach.  Pt able to produce phonation most notably with chin down to chest - as he is blocking air leak around trach.  Pt with tolerance of pmsv for only approx 3-4 minutes maximum as he desaturates to 84 - over time.  No severe air retention/CO2 trapping clinically noted upon removal of PMSV however advise only use for  a few minutes at a time. Copious secretions reported and pt requiring some suctioning to clear.  Advised he only place valve if he is not dyspneic, no significant secretions retained *cough to clear as needed, check inner cannula to assure clear* and oxygen saturation/RR acceptable.  Pt agreeable to plan and precaution signs tubed to room. SLP Visit Diagnosis: Aphonia (R49.1)    SLP Assessment  Patient needs continued Speech Lanaguage Pathology Services    Follow Up Recommendations  None (TBD)    Frequency and Duration min 2x/week       PMSV Trial PMSV was placed for: approximately 2 minutes total at a time due to pt's desaturation to 84 with use, quick rebound to 89 - 90 with valve removal Able to redirect subglottic air through upper airway: Yes Able to Attain Phonation: Yes (breathy voice - pt with large stoma and air leakage externally around trach) Voice Quality: Breathy;Other (comment) (with chin down to chest posture, pt able to achieve improved phonation- as he is closing off open around around his trach.) Able to Expectorate Secretions: No (pmsv removed with coughing) Level of Secretion Expectoration with PMSV: Not observed Breath Support for Phonation: Moderately decreased Intelligibility: Intelligible Respirations During Trial: 24 (18-44) SpO2 During Trial: 90 % (91-84) Pulse During Trial: 90 (86-95) Behavior: Alert;Good eye contact;Expresses self well;Cooperative;Responsive to questions   Tracheostomy Tube  Additional Tracheostomy Tube Assessment Secretion Description: viscous Level of Secretion Expectoration: Tracheal    Vent Dependency  Vent Dependent: No FiO2 (%): 40 %    Cuff Deflation Trial  GO Behavior: Alert;Expresses self well;Good eye contact        [name] 05/27/2020, 3:08 PM Kathleen Lime, MS [facility] SLP Acute Rehab Services Office [phone] Pager [phone]

## 2020-05-27 NOTE — Progress Notes (Signed)
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  Of note, patient was recently at [facility] a week back and since discharge she has been having more cough and copious sputum production.  He also had shortness of breath, hypoxia with choking spell so decided come to the hospital Review of previous records showed previous hospitalization for COVID ARDS with bilateral pneumothorax and tension pneumomediastinum. . Patient was  hospitalized for an extended period of time from 12/13 2021 until 04/17/2020 but during this hospitalization, he was weaned off the ventilator to trach collar.  On this admission, CT scan of the chest was performed which showed extensive pulmonary fibrosis and bronchiectasis but no evidence of new pneumonia.  Patient was then considered for admission to the hospital for further evaluation and treatment.  Subjective: The patient was seen and examined this morning, awake alert cooperative Tracheostomy collar in place, O2 by via trach satting 95% on 40% FiO2  Continue to have secretion by tracheostomy, has been suctioned successfully Concern for infection/ ? PNA. Currently hemodynamically stable    Assessment/Plan:  Principal Problem:   Acute on chronic respiratory failure with hypoxia ([name]) Active Problems:   Hodgkin's lymphoma ([place])   Uncontrolled type 2 diabetes mellitus with hyperglycemia, with long-term current use of insulin ([name])   Essential hypertension   GERD  without esophagitis   Pulmonary fibrosis ([name])   Bronchiectasis ([name])   Acute respiratory failure with hypoxia ([name])  Acute on chronic respiratory failure with hypoxia  Currently on tracheostomy.  Supposed to follow-up at trach clinic on 05/22/2020 but had missed.  -Pulmonary/critical care team has seen evaluate patient, for suction entirely -Obtain sputum for culture, continue antibiotics Hypertonic saline nebs valve therapy twice a day and Mucinex -Currently satting 95% on 40% FiO2 via trach  -desaturation in the 60s presentation.  -Mild leukocytosis but no fever.  - Follow sputum and blood cultures.   - Respiratory viral panel negative.    - ContinueChest physiotherapy.  -Procalcitonin less than 0.10. Covid test and flu was negative. Mild leukocytosis.  -Currently on IV vancomycin and cefepime.  We will continue to monitor closely... Blood cultures remain negative remain asymptomatic anticipating discontinuing antibiotics  Hodgkin's lymphoma -Currently stable Classic Hodgkin's lymphoma, nodular sclerosis subtype stage III diagnosed 02/2019. Follows up with Dr. Maylon Peppers, currently not on treatment. CT imaging of the chest performed during this hospitalization reveals progressive adenopathy. Will need to follow-up with oncology as outpatient  Uncontrolled type 2 diabetes mellitus with hyperglycemia, On tube feeds, Accu-Cheks, sliding scale insulin. -Hemoglobin A1c 5.8    Pulmonary fibrosis/bronchiectasis Extensive postinfection pulmonary fibrosis and bronchiectasis, patient is debilitated.  Currently on trach collar needs suctioning and trach care management.  Dysphagia Currently on tube feeds at home. Speech therapy for swallow evaluation currently NPO.    GERD without esophagitis   PPI   DVT prophylaxis: enoxaparin (LOVENOX) injection 40 mg Start: 05/26/20 1000   Code Status: Full code  Family Communication: Spoke with the patient's spouse on the phone and updated her  about the clinical condition of the patient  Status is: Observation  The patient will require care spanning > 2 midnights and should be moved to inpatient because: IV treatments appropriate due to intensity of illness or inability to take PO and Inpatient level of care appropriate due to severity of illness, on IV antibiotic, follow cultures  Dispo: The patient is from: Home              Anticipated d/c is to: Home with home health              Anticipated d/c date is: In a.m. if cultures remain negative, respiratory status improves              Patient currently is not medically stable to d/c.   Difficult to place patient No   Consultants:  Critical care team for trach assessment  Procedures:  None  Anti-infectives:  Marland Kitchen Vancomycin 2/14> . cefepime 2/14>  Anti-infectives (From admission, onward)   Start     Dose/Rate Route Frequency Ordered Stop   05/26/20 1400  levofloxacin (LEVAQUIN) 25 MG/ML solution 750 mg        750 mg Per Tube Daily 05/26/20 1318 06/09/20 1359   05/25/20 2000  ceFEPIme (MAXIPIME) 1 g in sodium chloride 0.9 % 100 mL IVPB  Status:  Discontinued        1 g 200 mL/hr over 30 Minutes Intravenous  Once 05/25/20 1922 05/25/20 1958   05/25/20 2000  vancomycin (VANCOREADY) IVPB 1500 mg/300 mL        1,500 mg 150 mL/hr over 120 Minutes Intravenous  Once 05/25/20 1923 05/25/20 2322   05/25/20 2000  ceFEPIme (MAXIPIME) 2 g in sodium chloride 0.9 % 100 mL IVPB        2 g 200 mL/hr over 30 Minutes Intravenous  Once 05/25/20 1958 05/25/20 2054   05/25/20 1900  ceFEPIme (MAXIPIME) 1 g in sodium chloride 0.9 % 100 mL IVPB  Status:  Discontinued        1 g 200 mL/hr over 30 Minutes Intravenous  Once 05/25/20 1853 05/25/20 1958       Objective: Vitals:   05/27/20 0552 05/27/20 0755  BP: (!) 99/57   Pulse: 79 77  Resp: 14 15  Temp: (!) 97.3 F (36.3 C)   SpO2: 96% 95%    Intake/Output Summary (Last 24 hours) at 05/27/2020 1019 Last data filed at 05/27/2020  0802 Gross per 24 hour  Intake 1784.33 ml  Output 950 ml  Net 834.33 ml   Filed Weights   05/25/20 1738  Weight: 62 kg   Body mass index is 22.75 kg/m.      Physical Exam:   General:  Alert, oriented, cooperative, no distress;   HEENT:   Tracheostomy-chronic O2 via trach  normocephalic, PERRL, otherwise with in Normal limits   Neuro:  CNII-XII intact. , normal motor and sensation, reflexes intact   Lungs:   Clear to auscultation BL, Respirations unlabored, no wheezes / crackles Diffuse upper respiratory Rales  Cardio:    S1/S2, RRR, No murmure, No Rubs or Gallops   Abdomen:   Soft, non-tender, bowel sounds active all four quadrants,  no guarding or peritoneal signs.  Muscular skeletal:   Generalized weaknesses, Limited exam - in bed, able to move all 4 extremities, Normal strength,  2+ pulses,  symmetric, No pitting edema  Skin:  Dry, warm to touch, negative for any Rashes,  Wounds: Please see nursing documentation          Data Review: I  have personally reviewed the following laboratory data and studies,  CBC: Recent Labs  Lab 05/25/20 1742 05/26/20 0535  WBC 14.3* 12.5*  NEUTROABS 10.7* 8.0*  HGB 11.0* 9.8*  HCT 35.4* 32.2*  MCV 89.8 91.7  PLT 228 601   Basic Metabolic Panel: Recent Labs  Lab 05/25/20 1742 05/26/20 0535  NA 138 137  K 4.5 3.9  CL 95* 99  CO2 33* 30  GLUCOSE 122* 108*  BUN 29* 20  CREATININE 0.53* 0.56*  CALCIUM 9.5 9.1  MG  --  1.7   Liver Function Tests: Recent Labs  Lab 05/25/20 1742 05/26/20 0535  AST 23 20  ALT 25 22  ALKPHOS 61 49  BILITOT 0.5 0.7  PROT 7.9 7.0  ALBUMIN 3.0* 2.6*   No results for input(s): LIPASE, AMYLASE in the last 168 hours. No results for input(s): AMMONIA in the last 168 hours. Cardiac Enzymes: No results for input(s): CKTOTAL, CKMB, CKMBINDEX, TROPONINI in the last 168 hours. BNP (last 3 results) Recent Labs    07/08/19 1815 07/11/19 0212 07/14/19 0458  BNP 407.1* 224.8* 55.9     ProBNP (last 3 results) No results for input(s): PROBNP in the last 8760 hours.  CBG: Recent Labs  Lab 05/26/20 1230 05/26/20 1449 05/26/20 1801 05/26/20 2356 05/27/20 0551  GLUCAP 112* 95 80 110* 145*   Recent Results (from the past 240 hour(s))  Expectorated Sputum Assessment w Gram Stain, Rflx to Resp Cult     Status: None   Collection Time: 05/25/20  1:06 AM   Specimen: Sputum  Result Value Ref Range Status   Specimen Description SPUTUM  Final   Special Requests NONE  Final   Sputum evaluation   Final    Sputum specimen not acceptable for testing.  Please recollect.   CALLED TO JEREMY [facility] @ 0932 ON 05/26/20 C VARNER Performed at [facility], [place] [address] [place], [place] [address]    Report Status 05/26/2020 FINAL  Final  Culture, blood (routine x 2)     Status: None (Preliminary result)   Collection Time: 05/25/20  7:00 PM   Specimen: BLOOD  Result Value Ref Range Status   Specimen Description   Final    BLOOD PORTA CATH Performed at [place] [address], [place], [place] [address]    Special Requests   Final    BOTTLES DRAWN AEROBIC AND ANAEROBIC Blood Culture adequate volume Performed at [place] [address], [place], [place] [address]    Culture   Final    NO GROWTH 2 DAYS Performed at [place] [address] [place], [place] [address]    Report Status PENDING  Incomplete  Resp Panel by RT-PCR (Flu A&B, Covid) Nasopharyngeal Swab     Status: None   Collection Time: 05/25/20  7:28 PM   Specimen: Nasopharyngeal Swab; Nasopharyngeal(NP) swabs in vial transport medium  Result Value Ref Range Status   SARS Coronavirus 2 by RT PCR NEGATIVE NEGATIVE Final    Comment: (NOTE) SARS-CoV-2 target nucleic acids are NOT DETECTED.  The SARS-CoV-2 RNA is generally detectable in upper respiratory specimens during the acute phase of infection. The lowest concentration of  SARS-CoV-2 viral copies this assay can detect is 138 copies/mL. A negative result does not preclude SARS-Cov-2 infection and should not be used as the sole basis for treatment or other patient management decisions. A negative result may occur with  improper specimen collection/handling, submission of specimen  other than nasopharyngeal swab, presence of viral mutation(s) within the areas targeted by this assay, and inadequate number of viral copies(<138 copies/mL). A negative result must be combined with clinical observations, patient history, and epidemiological information. The expected result is Negative.  Fact Sheet for Patients:  EntrepreneurPulse.com.au  Fact Sheet for Healthcare Providers:  IncredibleEmployment.be  This test is no t yet approved or cleared by the [place] FDA and  has been authorized for detection and/or diagnosis of SARS-CoV-2 by FDA under an Emergency Use Authorization (EUA). This EUA will remain  in effect (meaning this test can be used) for the duration of the COVID-19 declaration under Section 564(b)(1) of the Act, 21 U.S.C.section 360bbb-3(b)(1), unless the authorization is terminated  or revoked sooner.       Influenza A by PCR NEGATIVE NEGATIVE Final   Influenza B by PCR NEGATIVE NEGATIVE Final    Comment: (NOTE) The Xpert Xpress SARS-CoV-2/FLU/RSV plus assay is intended as an aid in the diagnosis of influenza from Nasopharyngeal swab specimens and should not be used as a sole basis for treatment. Nasal washings and aspirates are unacceptable for Xpert Xpress SARS-CoV-2/FLU/RSV testing.  Fact Sheet for Patients: EntrepreneurPulse.com.au  Fact Sheet for Healthcare Providers: IncredibleEmployment.be  This test is not yet approved or cleared by the [place] FDA and has been authorized for detection and/or diagnosis of SARS-CoV-2 by FDA under an Emergency Use  Authorization (EUA). This EUA will remain in effect (meaning this test can be used) for the duration of the COVID-19 declaration under Section 564(b)(1) of the Act, 21 U.S.C. section 360bbb-3(b)(1), unless the authorization is terminated or revoked.  Performed at [facility], [place] [address], [place], [place] [address]   Culture, blood (routine x 2)     Status: None (Preliminary result)   Collection Time: 05/25/20  8:25 PM   Specimen: BLOOD  Result Value Ref Range Status   Specimen Description   Final    BLOOD PORTA CATH Performed at [facility] [address], [place], [place] [address]    Special Requests   Final    BOTTLES DRAWN AEROBIC AND ANAEROBIC Blood Culture adequate volume Performed at [facility] [address], [place], [place] [address]    Culture   Final    NO GROWTH 2 DAYS Performed at [facility] [address], [place], [place] [address]    Report Status PENDING  Incomplete  Culture, Respiratory w Gram Stain     Status: None (Preliminary result)   Collection Time: 05/26/20  3:20 AM   Specimen: Tracheal Aspirate; Respiratory  Result Value Ref Range Status   Specimen Description   Final    TRACHEAL ASPIRATE Performed at [facility] [address], [place], [place] [address]    Special Requests   Final    NONE Performed at [facility], [place] [address], [place], [place] [address]    Gram Stain   Final    RARE WBC PRESENT, PREDOMINANTLY MONONUCLEAR NO ORGANISMS SEEN    Culture   Final    CULTURE REINCUBATED FOR BETTER GROWTH Performed at [facility], [place] [address], [place],  [address]    Report Status PENDING  Incomplete  Respiratory (~20 pathogens) panel by PCR     Status: None   Collection Time: 05/26/20  5:38 AM   Specimen: Tracheal Aspirate; Respiratory  Result Value Ref Range Status   Adenovirus NOT DETECTED NOT DETECTED Final  Coronavirus 229E NOT DETECTED NOT DETECTED Final    Comment: (NOTE) The Coronavirus on the Respiratory Panel, DOES NOT test for the novel  Coronavirus (2019 nCoV)    Coronavirus HKU1 NOT DETECTED NOT DETECTED Final   Coronavirus NL63 NOT DETECTED NOT DETECTED Final   Coronavirus OC43 NOT DETECTED NOT DETECTED Final   Metapneumovirus NOT DETECTED NOT DETECTED Final   Rhinovirus / Enterovirus NOT DETECTED NOT DETECTED Final   Influenza A NOT DETECTED NOT DETECTED Final   Influenza B NOT DETECTED NOT DETECTED Final   Parainfluenza Virus 1 NOT DETECTED NOT DETECTED Final   Parainfluenza Virus 2 NOT DETECTED NOT DETECTED Final   Parainfluenza Virus 3 NOT DETECTED NOT DETECTED Final   Parainfluenza Virus 4 NOT DETECTED NOT DETECTED Final   Respiratory Syncytial Virus NOT DETECTED NOT DETECTED Final   Bordetella pertussis NOT DETECTED NOT DETECTED Final   Bordetella Parapertussis NOT DETECTED NOT DETECTED Final   Chlamydophila pneumoniae NOT DETECTED NOT DETECTED Final   Mycoplasma pneumoniae NOT DETECTED NOT DETECTED Final    Comment: Performed at [facility], [place] [address], [place], [place] [address]     Studies: CT Angio Chest PE W/Cm &/Or Wo Cm  Result Date: 05/25/2020 CLINICAL DATA:  PE suspected, low/intermediate prob, positive D-dimer Shortness of breath with decreased oxygen saturation. EXAM: CT ANGIOGRAPHY CHEST WITH CONTRAST TECHNIQUE: Multidetector CT imaging of the chest was performed using the standard protocol during bolus administration of intravenous contrast. Multiplanar CT image reconstructions and MIPs were obtained to evaluate the vascular anatomy. CONTRAST:  53mL OMNIPAQUE IOHEXOL 350 MG/ML SOLN COMPARISON:  Radiograph earlier today. Chest CT 03/24/2020. PET CT 06/14/2019 FINDINGS: Cardiovascular: There are no filling defects within the pulmonary arteries to suggest pulmonary embolus. Subsegmental branches are not well assessed due to breathing motion artifact.  Dilatation of the main pulmonary artery at 3.5 cm. Mild aortic atherosclerosis. No acute aortic findings. Left chest port with tip at the atrial caval junction. Mild cardiomegaly. No pericardial effusion. Mediastinum/Nodes: Extensive mediastinal adenopathy that has progressed from prior exam. Representative node or nodal conglomerate in the right lower paratracheal station measures 3.9 x 4.0 cm, previously 3.2 x 3.6 cm. High right mediastinal/low supraclavicular node measures 2.3 cm short axis, previously 1.7 cm by my retrospective measurement. Progressive soft tissue density presumed nodal tissue in the right anterior mediastinum anterior to the aorta and IVC. Enlarged right internal mammary node adjacent to the lower sternum measures 2.1 cm. No axillary adenopathy. Tracheostomy with tip above the carina. Minimal retained mucus in the trachea. No esophageal wall thickening. No visualized thyroid nodule. Lungs/Pleura: Extensive chronic lung disease with multifocal bronchiectasis, peripheral honeycombing, and diffuse ground-glass opacities throughout the lung parenchyma. Calcified granuloma in the left lower lobe. No evidence of acute airspace disease. No significant pleural effusion. Previous pigtail catheter in the right anterior chest has been removed. No pneumothorax. Upper Abdomen: Small retrosternal lymph node measures 6 mm, series 4, image 99. Limited imaging of the upper abdomen without other acute finding. Musculoskeletal: No acute osseous abnormality or focal bone lesion. Thoracic spine spondylosis. Review of the MIP images confirms the above findings. IMPRESSION: 1. No pulmonary embolus. Dilatation of the main pulmonary artery consistent with pulmonary arterial hypertension. 2. Progressive thoracic adenopathy. This is greater than typically seen with reactive adenopathy, and suspicious for Hodgkin's lymphoma recurrence. 3. Extensive interstitial lung disease with pulmonary fibrosis and bronchiectasis. No  acute pulmonary findings. Aortic Atherosclerosis (ICD10-I70.0). Electronically Signed   By: Aurther Loft.D.  On: 05/25/2020 20:44   DG Chest Port 1 View  Result Date: 05/25/2020 CLINICAL DATA:  Shortness of breath. EXAM: PORTABLE CHEST 1 VIEW COMPARISON:  Most recent radiograph 04/07/2020 FINDINGS: Tracheostomy tube tip at the thoracic inlet. Left chest port remains in place. Chronic elevation of right hemidiaphragm. Interstitial lung disease with peripheral predominant reticulation, chronic and unchanged. No new airspace disease. Stable cardiomegaly and mediastinal contours. No pneumothorax. IMPRESSION: 1. Stable chronic interstitial lung disease. No new abnormalities. 2. Stable cardiomegaly. 3. Tracheostomy tube unchanged with tip at the thoracic inlet. Electronically Signed   By: Keith Rake M.D.   On: 05/25/2020 18:56      Deatra James, MD  [facility] 05/27/2020  If 7PM-7AM, please contact night-coverage

## 2020-05-27 NOTE — Progress Notes (Signed)
Suctioned patient 2x with thick tan secretions.

## 2020-05-27 NOTE — Progress Notes (Signed)
  Speech Language Pathology Treatment: Nada Boozer Speaking valve  Patient Details Name: Steve [name] MRN: [id number] DOB: [birth year] Today's Date: 05/27/2020 Time: 6283-6629 SLP Time Calculation (min) (ACUTE ONLY): 24 min  Assessment / Plan / Recommendation Clinical Impression  Third treatment session of the day to educate pt to use of PMSV and most importantly to demonstrate removal of PMSV as needed.  SLP obtained mirror for pt to use initially for pt to use to allow visual cueing and demonstrated to pt use of PMSV.  Pt initially able to perform donning/removal with mod cues fading to mod I even without mirror usage.  He reports being confident in donning/removal and understands that assuring trach does not move *securinig with nondominant hand* is most important. Using teach back, pt able to verbalize not to use PMSV more than a few minutes, to remove when oxygen saturation declines to 84, assure secretion clearance prior to placement.  Pt independently removed PMSV when he produced reflexive coughing.  He does advise he monitors his oxygen saturation closely when home and taught back importance.     Chin tuck allows phonation to be better due to closing off area around stoma.  Question if bandaging may decrease opening around stoma for optimal phonation?    Recommend PMSV independently placed by pt - but on for a few minutes at a time ONLY.  Will follow.  Pt's pursed lip breathing toward end of treatment noted and advised him not to use PMSV if short of breath.      HPI HPI: pt is a 65 yo male with h/o Hodgkin's lymphoma, s/p COVID requiring hospital admit from 03/17/2020 to1/10/2020.  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  Swallow and PMSV ordered received.  Follow up for PMSV tolerance and education for donning, removal, etc indicated.      SLP Plan  Continue with current plan of care  Patient needs  continued Speech Lanaguage Pathology Services    Recommendations  Diet recommendations: Thin liquid Liquids provided via: Straw;Teaspoon;Cup Medication Administration: Via alternative means Compensations: Slow rate;Small sips/bites      Patient may use Passy-Muir Speech Valve:  (PT able to don and remove PMSV independently but needs oxygen sensory within his view to monitor closely) PMSV Supervision: Intermittent         Oral Care Recommendations: Oral care BID Follow up Recommendations:  (TBD) SLP Visit Diagnosis: Aphonia (R49.1) Plan: Continue with current plan of care       GO                [name] 05/27/2020, 3:25 PM  Kathleen Lime, MS [place] Office [phone] Pager [phone]

## 2020-05-27 NOTE — Progress Notes (Signed)
Initial Nutrition Assessment  DOCUMENTATION CODES:   Non-severe (moderate) malnutrition in context of chronic illness  INTERVENTION:  - will order TF regimen: Osmolite 1.2 @ 65 ml/hr with 45 ml Prosource TF TID and 100 ml free water every 4 hours.    NUTRITION DIAGNOSIS:   Moderate Malnutrition related to chronic illness,cancer and cancer related treatments,other (see comment) (post-COVID) as evidenced by moderate fat depletion,moderate muscle depletion.  GOAL:   Patient will meet greater than or equal to 90% of their needs  MONITOR:   PO intake,TF tolerance,Labs,Weight trends  REASON FOR ASSESSMENT:   Consult Enteral/tube feeding initiation and management  ASSESSMENT:   66 year old male with medical history of lymphoma, HTN, GERD, type 2 DM, s/p PEG placement with TFs, and chronic respiratory failure 2/2 post-COVID pulmonary fibrosis and bronchiectasis s/p trach. He presented to the ED with cough and productive sputum.  Diet advanced from NPO to FLD today at 1120. Patient had applesauce and a can of soda in front of him at the time of RD visit.   SLP note from this AM states "recommend full liquid diet for comfort and to continue to utlize swallow...use PEG for nutrition."  Patient in bed with no family or visitors at bedside. Patient and RD familiar with each other from patient's previous hospitalizations.   Patient denies any changes to TF regimen. Regimen is Osmolite 1.2 @ 65 ml/hr with 45 ml Prosource TF (or equivalent) TID and 100 ml free water every 4 hours. This regimen provides 1992 kcal, 119 grams protein, and 1879 ml free water.   Weight on 2/14 was 137 lb and weight on 04/17/20 was 135 lb.    Labs reviewed; CBGs: 145 and 147 mg/dl, Cl: 97 mmol/l, Ca: 8.4 mg/dl.  Medications reviewed; 300 mg ferrous sulfate/day, sliding scale novolog, 40 mg protonix/day.     NUTRITION - FOCUSED PHYSICAL EXAM:  Flowsheet Row Most Recent Value  Orbital Region Mild depletion   Upper Arm Region Moderate depletion  Thoracic and Lumbar Region Unable to assess  Buccal Region Moderate depletion  Temple Region Moderate depletion  Clavicle Bone Region Moderate depletion  Clavicle and Acromion Bone Region Moderate depletion  Scapular Bone Region Moderate depletion  Dorsal Hand Moderate depletion  Patellar Region Moderate depletion  Anterior Thigh Region Severe depletion  Posterior Calf Region Severe depletion  Edema (RD Assessment) None  Hair Reviewed  Eyes Reviewed  Mouth Reviewed  Skin Reviewed  Nails Reviewed       Diet Order:   Diet Order            Diet full liquid Room service appropriate? Yes; Fluid consistency: Thin  Diet effective now                 EDUCATION NEEDS:   No education needs have been identified at this time  Skin:  Skin Assessment: Reviewed RN Assessment  Last BM:  2/15  Height:   Ht Readings from Last 1 Encounters:  05/25/20 5\' 5"  (1.651 m)    Weight:   Wt Readings from Last 1 Encounters:  05/25/20 62 kg    Estimated Nutritional Needs:  Kcal:  1950-2150 kcal Protein:  100-115 grams Fluid:  >/= 2 L/day     Jarome Matin, MS, RD, LDN, CNSC Inpatient Clinical Dietitian RD pager # available in [facility]  After hours/weekend pager # available in [facility]

## 2020-05-27 NOTE — Progress Notes (Signed)
NAME:  Steve [name], MRN:  [id number], DOB:  18-Mar-1956, LOS: 1 ADMISSION DATE:  05/25/2020, CONSULTATION DATE:  2/15 REFERRING MD:  Louanne Belton, CHIEF COMPLAINT:  Trach management and acute on chronic hypoxia     History of Present Illness:   65 year old male patient well-known to the pulmonary service in the setting of what is now chronic respiratory failure and chronic tracheostomy dependence following Covid related pneumonia with ARDS and subsequent prolonged ventilatory dependence.  Just recently discharged from [place] to home on ATC. Was only out about a week before being readmitted for mucous plugging and acute on chronic respiratory failure     Past Medical History:  Chronic respiratory failure secondary to Covid related pulmonary fibrosis and bronchiectasis Tracheostomy dependence with poor cough mechanics Severe deconditioning Dysphagia. Actually has passed his swallow eval Lymphoma Prior stenotrophomonas pulm colonization Bilateral foot drop   Significant Hospital Events:  2/15 admitted for obs. Sputum sent. Trach changed from portex to 6 shiley   Consults:  pulm  Procedures:  Trach change   Significant Diagnostic Tests:  CT chest 1. No pulmonary embolus. Dilatation of the main pulmonary artery consistent with pulmonary arterial hypertension.2. Progressive thoracic adenopathy. This is greater than typically seen with reactive adenopathy, and suspicious for Hodgkin's lymphoma Recurrence. 3. Extensive interstitial lung disease with pulmonary fibrosis and bronchiectasis. No acute pulmonary findings.  Micro Data:  Respiratory culture 2/15>>>  Antimicrobials:  vanc and zosyn 2/14 levaquin 2/15>>>  Interim History / Subjective:  Feels better. Working w/ SLP. He does desaturate w/ the PMV in place. But does well with it off  Objective   Blood pressure (Abnormal) 99/57, pulse 77, temperature (Abnormal) 97.3 F (36.3 C), resp. rate 15, height 5\' 5"  (1.651 m), weight 62  kg, SpO2 95 %.    FiO2 (%):  [40 %] 40 %   Intake/Output Summary (Last 24 hours) at 05/27/2020 1038 Last data filed at 05/27/2020 0802 Gross per 24 hour  Intake 1784.33 ml  Output 950 ml  Net 834.33 ml   Filed Weights   05/25/20 1738  Weight: 62 kg    Examination:  General pleasant 65 year old male resting in bed. Working w/ SLP appears comfortable HENT sp hoarse/raspy. I worry he may actually have some vocal cord injury in addition to some tracheal stenosis given two factors 1) raspy/hoarse phonation suggests perhaps some vocal cord injury and the fact that he has trouble when PMV in place would suggest some degree of tracheal stenosis or subglottic injury.  Pulm some scattered rhonchi. Still has dark green/brown tracheal secretions req freq suction Card rrr abd soft Ext warm dry bilateral foot drop Neuro intact   Resolved Hospital Problem list     Assessment & Plan:  Acute on chronic hypoxic respiratory failure 2/2 purulent bronchitis/bronchiectasis flare w/ intermittent mucous plugging,  superimposed on underlying COVID related ILD and severe deconditioning.  Tracheostomy dependence  Subglottis stenosis  Dysphagia   -treating as purulent bronchitis/BTX flare -looks better.  -trach changed to cuffless which has helped w/ him tolerating when his inner canula has been obstructed w/ mucous.  -he does not tolerate capping for prolonged periods of time. I suspect that he has some degree of subglottic/tracheal stenosis but also I am concerned about possible vocal cord injury.   Plan/rec F/u pending sputum Will reach out to case management. I think it is going to be paramount that his DME equipment and supplies are adequate  Needs: 1) a second concentrator to ensure he can  get his 40% ATC at home, suction catheters, neb machine, Chest PT vest. Will ask CM to reach out to his wife. I feel like she can keep him out of the hospital if we can get the the equipment to do so.  Day 2/14  levaquin Would encourage PMV for short   Erick Colace ACNP-BC [place] Pager # [phone] OR # [phone] if no answer

## 2020-05-27 NOTE — Progress Notes (Signed)
  Speech Language Pathology Treatment: Dysphagia  Patient Details Name: Steve [name] MRN: [id number] DOB: 12/14/1955 Today's Date: 05/27/2020 Time: 0930-1001 SLP Time Calculation (min) (ACUTE ONLY): 31 min  Assessment / Plan / Recommendation Clinical Impression  Treatment indicated to assess readiness for po diet after BSE completed yesterday.  Today pt is able to maintain his oxygen saturation much better than yesterday in the ED.  He was willing to consume po intake after SLP had him brush is oral cavity to clear biofilm.    No indication of aspiration with all po observed including ice chips, thin water, soda and jello.  Swallow clinically was timely and pt able to self feed- sitting fully upright.    SLP educated pt by demonstrating swallow timing with breathing importance and length of time needed to swallow by pt conducting swallowing with attentiveness to said factors.  Pt reports understanding of importance for RR to be adequate due to risk of episodic aspiration with dyspnea.  Also advised he assure adequate clearance of secretions and adequate oxygen saturation to consume anything for maximal airway protection.    Of note, pt did not have on his PMSV during po intake as he can only tolerate it for a few minutes at a time.  Recommend start full liquids with precautions.  As teatment sessions continued, pt noted to have pursed lip breathing and admitted to some fatigue.    HPI HPI: pt is a 65 yo male with h/o Hodgkin's lymphoma, s/p COVID requiring hospital admit from 03/17/2020 to1/10/2020.  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  Swallow and PMSV ordered received.  Pt reports increased issues with thickness of secretions and states he has been NPO since mid January.      SLP Plan  Continue with current plan of care  Patient needs continued Speech Lanaguage Pathology Services    Recommendations   Diet recommendations: Thin liquid Liquids provided via: Straw;Teaspoon;Cup Medication Administration: Via alternative means Compensations: Slow rate;Small sips/bites      Patient may use Passy-Muir Speech Valve: Intermittently with supervision PMSV Supervision: Full         Oral Care Recommendations: Oral care BID Follow up Recommendations:  (TBD) SLP Visit Diagnosis: Dysphagia, unspecified (R13.10) Plan: Continue with current plan of care       GO                [name] 05/27/2020, 3:18 PM  Kathleen Lime, MS [facility] SLP Acute Rehab Services Office [phone] Pager [phone]

## 2020-05-27 NOTE — Progress Notes (Signed)
SLP PMSV evaluation and swallow treatment completed.  Full notes to follow.  Pt with tolerance of pmsv for only approx 3-4 minutes maximum as he desaturates to 84 - over time.  No severe air retention/CO2 trapping clinically noted upon removal of PMSV however advise only use for a few minutes at a time. Copious secretions reported and pt requiring suctioning to clear.  Advised he only place valve is work of breathing is adequate, no significant secretions retained *cough to clear as needed, check inner cannula* and oxygen saturation/RR acceptable.     Pt taught to don and remove valve independently.  Performed with min cues initially using mirror fading to mod I without mirror.  Advised most important is to be able to remove valve and secure trach with nondominant hand.    Pt is losing air around his trach due to stoma size and thus phonation is impaired.  With chin tuck posture, pt able to achieve phonation with and without PMSV, therefore advised his use this posture with talking.    Regarding swallowing, given pt had MBS Apr 08 2020 with no aspiration or penetration observed and absence of neuro changes - suspect mbs would reveal same swallow function.  Pt with no focal CN deficits and articulation is clear.  PMSV was not placed for po trials given tolerance only for a few minutes at a time.  Fortunately, pt has excellent dexterity and is is able to self feed ice, jello, soda with timely swallow and no indication of aspiration.  His RR does vary from 18-33-40 with effort and thus informed his to importance of monitoring RR and cease intake if RR is elevated.  Using demonstration and teach back of apnea with swallowing and length of time swallow required, pt indicated understanding of precautions. Further advised he intermittently conduct dry swallow to clear potential retention in pharynx that he does not sense.  Recommend full liquid diet for comfort and to continue to utilize swallow musculature and use  PEG for Nutrition.  Pt agreeable to plan.    Attachment line for PMSV, precautions/use signs sent to floor for valve and sign placement in room.  SLP to follow. Thanks.   Kathleen Lime, MS [facility] SLP Acute Rehab Services Office [phone] Pager [phone]

## 2020-05-28 LAB — GLUCOSE, CAPILLARY
Glucose-Capillary: 119 mg/dL — ABNORMAL HIGH (ref 70–99)
Glucose-Capillary: 124 mg/dL — ABNORMAL HIGH (ref 70–99)
Glucose-Capillary: 129 mg/dL — ABNORMAL HIGH (ref 70–99)
Glucose-Capillary: 147 mg/dL — ABNORMAL HIGH (ref 70–99)

## 2020-05-28 LAB — CULTURE, RESPIRATORY W GRAM STAIN

## 2020-05-28 LAB — PROCALCITONIN: Procalcitonin: <0.1 ng/mL

## 2020-05-28 MED ORDER — SODIUM CHLORIDE 3 % IN NEBU: 4.0000 mL | INHALATION_SOLUTION | Freq: Two times a day (BID) | RESPIRATORY_TRACT | Status: DC

## 2020-05-28 MED ORDER — SODIUM CHLORIDE 3 % IN NEBU
4.0000 mL | INHALATION_SOLUTION | Freq: Two times a day (BID) | RESPIRATORY_TRACT | Status: DC
  Administered 2020-05-28 – 2020-05-29 (×3): 4 mL via RESPIRATORY_TRACT
  Filled 2020-05-28 (×4): qty 4

## 2020-05-28 MED ORDER — CIPROFLOXACIN HCL 500 MG PO TABS
500.0000 mg | ORAL_TABLET | Freq: Two times a day (BID) | ORAL | Status: DC
  Administered 2020-05-28 – 2020-05-29 (×3): 500 mg
  Filled 2020-05-28 (×3): qty 1

## 2020-05-28 NOTE — Progress Notes (Signed)
The patient has bronchiectasis J47.9. As a result the patients cough and deep breathing have been debilitated. The patient has tried other standard treatments such as chest physiotherapy (CPT). However the patient does not have a caregiver that is available to provide adequate CPT for 30 minutes, twice a day, 365 days a year. Patient would benefit from HFCWO (High frequency Chest Wall Oscillation) vest therapy.  Erick Colace ACNP-BC [place] Pager # [phone] OR # [phone] if no answer

## 2020-05-28 NOTE — Progress Notes (Signed)
NAME:  Steve [name], MRN:  [id number], DOB:  1955-07-12, LOS: 2 ADMISSION DATE:  05/25/2020, CONSULTATION DATE:  2/15 REFERRING MD:  Louanne Belton, CHIEF COMPLAINT:  Trach management and acute on chronic hypoxia     History of Present Illness:   65 year old male patient well-known to the pulmonary service in the setting of what is now chronic respiratory failure and chronic tracheostomy dependence following Covid related pneumonia with ARDS and subsequent prolonged ventilatory dependence.  Just recently discharged from [facility] to home on ATC. Was only out about a week before being readmitted for mucous plugging and acute on chronic respiratory failure     Past Medical History:  Chronic respiratory failure secondary to Covid related pulmonary fibrosis and bronchiectasis Tracheostomy dependence with poor cough mechanics Severe deconditioning Dysphagia. Actually has passed his swallow eval Lymphoma Prior stenotrophomonas pulm colonization Bilateral foot drop   Significant Hospital Events:  2/15 admitted for obs. Sputum sent. Trach changed from portex to 6 shiley   Consults:  pulm  Procedures:  Trach change   Significant Diagnostic Tests:  CT chest 1. No pulmonary embolus. Dilatation of the main pulmonary artery consistent with pulmonary arterial hypertension.2. Progressive thoracic adenopathy. This is greater than typically seen with reactive adenopathy, and suspicious for Hodgkin's lymphoma Recurrence. 3. Extensive interstitial lung disease with pulmonary fibrosis and bronchiectasis. No acute pulmonary findings.  Micro Data:  Respiratory culture 2/15>>>  Antimicrobials:  vanc and zosyn 2/14 levaquin 2/15>>>  Interim History / Subjective:  Feels better. Working w/ SLP. He does desaturate w/ the PMV in place. But does well with it off  Objective   Blood pressure 102/70, pulse 84, temperature 98.3 F (36.8 C), temperature source Oral, resp. rate 17, height 5\' 5"  (1.651 m),  weight 67.7 kg, SpO2 96 %.    FiO2 (%):  [40 %] 40 %   Intake/Output Summary (Last 24 hours) at 05/28/2020 1024 Last data filed at 05/28/2020 0537 Gross per 24 hour  Intake 726.67 ml  Output 2075 ml  Net -1348.33 ml   Filed Weights   05/25/20 1738 05/28/20 0500  Weight: 62 kg 67.7 kg    Examination:  General 66 year old male resting in bed HENT NCAT no JVD MM clear. Still has brown/green tracheal secretions but the are clearing Pulm dec t/o but no accessory use Card rrr abd soft not tender Ext warm and dry bLe foot drop Neuro intact  Resolved Hospital Problem list     Assessment & Plan:   Acute on chronic hypoxic respiratory failure 2/2 purulent bronchitis/bronchiectasis flare w/ intermittent mucous plugging,  superimposed on underlying COVID related ILD and severe deconditioning.  Pseudomonas tracheobronchitis  Tracheostomy dependence  Subglottis stenosis  Dysphagia   -treating as purulent bronchitis/BTX flare -looks better.  -trach changed to cuffless which has helped w/ him tolerating when his inner canula has been obstructed w/ mucous.  -he does not tolerate capping for prolonged periods of time. I suspect that he has some degree of subglottic/tracheal stenosis but also I am concerned about possible vocal cord injury.   Plan/rec Cont 40% ATC Day 3/14 levaquin  Please also be sure he goes home with the hypertonic saline nebs I have reached out to CM-->working on getting his DME needs set up I think he can go home as soon as we know he has what he needs from DME provider I will see him on 2/23 (has appointment w/ me already)  I updated his wife     Steve [name]  ACNP-BC [place] Pager # [phone] OR # [phone] if no answer

## 2020-05-28 NOTE — Progress Notes (Signed)
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  Of note, patient was recently at [facility] acute [facility] a week back and since discharge she has been having more cough and copious sputum production.  He also had shortness of breath, hypoxia with choking spell so decided come to the hospital Review of previous records showed previous hospitalization for COVID ARDS with bilateral pneumothorax and tension pneumomediastinum. . Patient was  hospitalized for an extended period of time from 12/13 2021 until 04/17/2020 but during this hospitalization, he was weaned off the ventilator to trach collar.  On this admission, CT scan of the chest was performed which showed extensive pulmonary fibrosis and bronchiectasis but no evidence of new pneumonia.  Patient was then considered for admission to the hospital for further evaluation and treatment.  Subjective: The patient was seen and examined this morning, nursing staff was at bedside, suctioning copious sputum from trach... Patient is awake alert, but satting in low 80s on 10 L of oxygen via trach collar  Complaining of cough, mild shortness of breath but denies any chest pain  Hemodynamically stable afebrile normotensive Sputum questionable for Pseudomonas  Assessment/Plan:  Principal Problem:   Acute on chronic respiratory failure with hypoxia ([name]) Active Problems:   Hodgkin's lymphoma ([place])   Uncontrolled type 2 diabetes mellitus with hyperglycemia, with  long-term current use of insulin ([name])   Essential hypertension   GERD without esophagitis   Pulmonary fibrosis ([name])   Bronchiectasis ([name])   Acute respiratory failure with hypoxia ([name])  Acute on chronic respiratory failure with hypoxia  Currently on tracheostomy.   -Continue to have copious sputum production, -On 10 L of oxygen this morning was satting low 80s but awake alert  -Pulmonary/critical care team has seen evaluate patient, for suction entirely -Obtain sputum for culture, continue antibiotics Hypertonic saline nebs valve therapy twice a day and Mucinex -Pulmonary critical following, recommending continuing 40% ATC, -Sputum positive for Pseudomonas currently on Levaquin -Continue hypertonic saline nebs -Continue mucolytic's -As needed trach suction-wife has been trained by nursing staff   -Mild leukocytosis but no fever.  -Blood cultures negative to date -Sputum cultures ??  Pseudomonas - Respiratory viral panel negative.    -Procalcitonin less than 0.10. Covid test and flu was negative. Mild leukocytosis.  -Currently on IV vancomycin and cefepime >> has been switched to p.o. Levaquin  Hodgkin's lymphoma -Remained stable Classic Hodgkin's lymphoma, nodular sclerosis subtype stage III diagnosed 02/2019. Follows up with Dr. Maylon Peppers, currently not on treatment. CT imaging of the chest performed during this hospitalization reveals progressive adenopathy. Will need to follow-up with oncology as outpatient  Uncontrolled type 2 diabetes mellitus with hyperglycemia, On tube feeds, Accu-Cheks, sliding scale insulin. -Hemoglobin A1c 5.8    Pulmonary fibrosis/bronchiectasis Extensive postinfection pulmonary fibrosis and bronchiectasis, patient is debilitated.  Currently on trach collar needs suctioning and trach care management. -Continue to be followed by pulmonary  Dysphagia Currently on tube feeds at home. Speech therapy for swallow evaluation currently NPO.    GERD  without esophagitis   PPI   DVT prophylaxis: enoxaparin (LOVENOX)  injection 40 mg Start: 05/26/20 1000   Code Status: Full code  Family Communication: Spoke with the patient's spouse on the phone and updated her about the clinical condition of the patient  Status is: Observation  The patient will require care spanning > 2 midnights and should be moved to inpatient because: IV treatments appropriate due to intensity of illness or inability to take PO and Inpatient level of care appropriate due to severity of illness, on IV antibiotic, follow cultures  Dispo: The patient is from: Home              Anticipated d/c is to: Home with home health              Anticipated d/c date is: In a.m. if cultures remain negative, respiratory status improves              Patient currently is not medically stable to d/c.   Difficult to place patient No   Consultants:  Critical care team for trach assessment  Procedures:  None  Anti-infectives:  Marland Kitchen Vancomycin 2/14> . cefepime 2/14>  Anti-infectives (From admission, onward)   Start     Dose/Rate Route Frequency Ordered Stop   05/26/20 1400  levofloxacin (LEVAQUIN) 25 MG/ML solution 750 mg        750 mg Per Tube Daily 05/26/20 1318 06/09/20 1359   05/25/20 2000  ceFEPIme (MAXIPIME) 1 g in sodium chloride 0.9 % 100 mL IVPB  Status:  Discontinued        1 g 200 mL/hr over 30 Minutes Intravenous  Once 05/25/20 1922 05/25/20 1958   05/25/20 2000  vancomycin (VANCOREADY) IVPB 1500 mg/300 mL        1,500 mg 150 mL/hr over 120 Minutes Intravenous  Once 05/25/20 1923 05/25/20 2322   05/25/20 2000  ceFEPIme (MAXIPIME) 2 g in sodium chloride 0.9 % 100 mL IVPB        2 g 200 mL/hr over 30 Minutes Intravenous  Once 05/25/20 1958 05/25/20 2054   05/25/20 1900  ceFEPIme (MAXIPIME) 1 g in sodium chloride 0.9 % 100 mL IVPB  Status:  Discontinued        1 g 200 mL/hr over 30 Minutes Intravenous  Once 05/25/20 1853 05/25/20 1958        Objective: Vitals:   05/28/20 0352 05/28/20 0836  BP: 102/70   Pulse: 79 84  Resp: 15 17  Temp: 98.3 F (36.8 C)   SpO2: 98% 96%    Intake/Output Summary (Last 24 hours) at 05/28/2020 1036 Last data filed at 05/28/2020 0537 Gross per 24 hour  Intake 726.67 ml  Output 2075 ml  Net -1348.33 ml   Filed Weights   05/25/20 1738 05/28/20 0500  Weight: 62 kg 67.7 kg   Body mass index is 24.84 kg/m.      Physical Exam:   General:  Alert, oriented, cooperative, no distress;   HEENT:   Trach in place, 10 L of oxygen via trach collar, sputum production  normocephalic, PERRL, otherwise with in Normal limits   Neuro:  CNII-XII intact. , normal motor and sensation, reflexes intact   Lungs:   Clear to auscultation BL, Respirations unlabored, no wheezes / crackles  Cardio:    S1/S2, RRR, No murmure, No Rubs or Gallops   Abdomen:   Soft, non-tender, bowel sounds active all four quadrants,  no guarding or peritoneal signs... PEG tube in place  Muscular skeletal:   Severe generalized weaknesses,  Limited exam - in  bed, able to move all 4 extremities, diffuse reduce muscle strength 2+ pulses,  symmetric, No pitting edema  Skin:  Dry, warm to touch, negative for any Rashes,  Wounds: Please see nursing documentation            Data Review: I have personally reviewed the following laboratory data and studies,  CBC: Recent Labs  Lab 05/25/20 1742 05/26/20 0535 05/27/20 1113  WBC 14.3* 12.5* 9.6  NEUTROABS 10.7* 8.0*  --   HGB 11.0* 9.8* 9.0*  HCT 35.4* 32.2* 29.3*  MCV 89.8 91.7 91.3  PLT 228 218 195   Basic Metabolic Panel: Recent Labs  Lab 05/25/20 1742 05/26/20 0535 05/27/20 1113  NA 138 137 136  K 4.5 3.9 3.8  CL 95* 99 97*  CO2 33* 30 32  GLUCOSE 122* 108* 161*  BUN 29* 20 17  CREATININE 0.53* 0.56* 0.62  CALCIUM 9.5 9.1 8.4*  MG  --  1.7 1.7  PHOS  --   --  2.6   Liver Function Tests: Recent Labs  Lab 05/25/20 1742 05/26/20 0535 05/27/20 1113   AST 23 20 17   ALT 25 22 19   ALKPHOS 61 49 53  BILITOT 0.5 0.7 0.5  PROT 7.9 7.0 6.5  ALBUMIN 3.0* 2.6* 2.4*   No results for input(s): LIPASE, AMYLASE in the last 168 hours. No results for input(s): AMMONIA in the last 168 hours. Cardiac Enzymes: No results for input(s): CKTOTAL, CKMB, CKMBINDEX, TROPONINI in the last 168 hours. BNP (last 3 results) Recent Labs    07/08/19 1815 07/11/19 0212 07/14/19 0458  BNP 407.1* 224.8* 55.9    ProBNP (last 3 results) No results for input(s): PROBNP in the last 8760 hours.  CBG: Recent Labs  Lab 05/27/20 0551 05/27/20 1154 05/27/20 1737 05/27/20 2353 05/28/20 0544  GLUCAP 145* 147* 151* 130* 119*   Recent Results (from the past 240 hour(s))  Expectorated Sputum Assessment w Gram Stain, Rflx to Resp Cult     Status: None   Collection Time: 05/25/20  1:06 AM   Specimen: Sputum  Result Value Ref Range Status   Specimen Description SPUTUM  Final   Special Requests NONE  Final   Sputum evaluation   Final    Sputum specimen not acceptable for testing.  Please recollect.   CALLED TO JEREMY [facility] @ 0932 ON 05/26/20 C VARNER Performed at [facility], [place] [address], [place], [place] [address]    Report Status 05/26/2020 FINAL  Final  Culture, blood (routine x 2)     Status: None (Preliminary result)   Collection Time: 05/25/20  7:00 PM   Specimen: BLOOD  Result Value Ref Range Status   Specimen Description   Final    BLOOD PORTA CATH Performed at [facility] [address] [place], [place] [address]    Special Requests   Final    BOTTLES DRAWN AEROBIC AND ANAEROBIC Blood Culture adequate volume Performed at [facility] [address] [place], [place] [address]    Culture   Final    NO GROWTH 3 DAYS Performed at [facility], [place] [address] [place], [place] [address]    Report Status PENDING  Incomplete  Resp Panel by RT-PCR (Flu A&B, Covid) Nasopharyngeal  Swab     Status: None   Collection Time: 05/25/20  7:28 PM   Specimen: Nasopharyngeal Swab; Nasopharyngeal(NP) swabs in vial transport medium  Result Value Ref Range Status   SARS Coronavirus  2 by RT PCR NEGATIVE NEGATIVE Final    Comment: (NOTE) SARS-CoV-2 target nucleic acids are NOT DETECTED.  The SARS-CoV-2 RNA is generally detectable in upper respiratory specimens during the acute phase of infection. The lowest concentration of SARS-CoV-2 viral copies this assay can detect is 138 copies/mL. A negative result does not preclude SARS-Cov-2 infection and should not be used as the sole basis for treatment or other patient management decisions. A negative result may occur with  improper specimen collection/handling, submission of specimen other than nasopharyngeal swab, presence of viral mutation(s) within the areas targeted by this assay, and inadequate number of viral copies(<138 copies/mL). A negative result must be combined with clinical observations, patient history, and epidemiological information. The expected result is Negative.  Fact Sheet for Patients:  EntrepreneurPulse.com.au  Fact Sheet for Healthcare Providers:  IncredibleEmployment.be  This test is no t yet approved or cleared by the [place] FDA and  has been authorized for detection and/or diagnosis of SARS-CoV-2 by FDA under an Emergency Use Authorization (EUA). This EUA will remain  in effect (meaning this test can be used) for the duration of the COVID-19 declaration under Section 564(b)(1) of the Act, 21 U.S.C.section 360bbb-3(b)(1), unless the authorization is terminated  or revoked sooner.       Influenza A by PCR NEGATIVE NEGATIVE Final   Influenza B by PCR NEGATIVE NEGATIVE Final    Comment: (NOTE) The Xpert Xpress SARS-CoV-2/FLU/RSV plus assay is intended as an aid in the diagnosis of influenza from Nasopharyngeal swab specimens and should not be used as a sole  basis for treatment. Nasal washings and aspirates are unacceptable for Xpert Xpress SARS-CoV-2/FLU/RSV testing.  Fact Sheet for Patients: EntrepreneurPulse.com.au  Fact Sheet for Healthcare Providers: IncredibleEmployment.be  This test is not yet approved or cleared by the [place] FDA and has been authorized for detection and/or diagnosis of SARS-CoV-2 by FDA under an Emergency Use Authorization (EUA). This EUA will remain in effect (meaning this test can be used) for the duration of the COVID-19 declaration under Section 564(b)(1) of the Act, 21 U.S.C. section 360bbb-3(b)(1), unless the authorization is terminated or revoked.  Performed at [facility], [place] [address] [place], [place] [address]   Culture, blood (routine x 2)     Status: None (Preliminary result)   Collection Time: 05/25/20  8:25 PM   Specimen: BLOOD  Result Value Ref Range Status   Specimen Description   Final    BLOOD PORTA CATH Performed at [facility] [address] [place], [place] [address]    Special Requests   Final    BOTTLES DRAWN AEROBIC AND ANAEROBIC Blood Culture adequate volume Performed at [facility] [address], [place], [place] [address]    Culture   Final    NO GROWTH 3 DAYS Performed at [facility], [place] [address] [place], [place] [address]    Report Status PENDING  Incomplete  Culture, Respiratory w Gram Stain     Status: None   Collection Time: 05/26/20  3:20 AM   Specimen: Tracheal Aspirate; Respiratory  Result Value Ref Range Status   Specimen Description   Final    TRACHEAL ASPIRATE Performed at [place] [address] [place], [place] [address]    Special Requests   Final    NONE Performed at [facility], [place] [address] [place],  [address]    Gram Stain   Final  RARE WBC PRESENT, PREDOMINANTLY  MONONUCLEAR NO ORGANISMS SEEN Performed at [facility] [address] [place], [place] [address]    Culture FEW PSEUDOMONAS AERUGINOSA  Final   Report Status 05/28/2020 FINAL  Final   Organism ID, Bacteria PSEUDOMONAS AERUGINOSA  Final      Susceptibility   Pseudomonas aeruginosa - MIC*    CEFTAZIDIME 8 SENSITIVE Sensitive     CIPROFLOXACIN <=0.25 SENSITIVE Sensitive     GENTAMICIN <=1 SENSITIVE Sensitive     IMIPENEM 1 SENSITIVE Sensitive     * FEW PSEUDOMONAS AERUGINOSA  Respiratory (~20 pathogens) panel by PCR     Status: None   Collection Time: 05/26/20  5:38 AM   Specimen: Tracheal Aspirate; Respiratory  Result Value Ref Range Status   Adenovirus NOT DETECTED NOT DETECTED Final   Coronavirus 229E NOT DETECTED NOT DETECTED Final    Comment: (NOTE) The Coronavirus on the Respiratory Panel, DOES NOT test for the novel  Coronavirus (2019 nCoV)    Coronavirus HKU1 NOT DETECTED NOT DETECTED Final   Coronavirus NL63 NOT DETECTED NOT DETECTED Final   Coronavirus OC43 NOT DETECTED NOT DETECTED Final   Metapneumovirus NOT DETECTED NOT DETECTED Final   Rhinovirus / Enterovirus NOT DETECTED NOT DETECTED Final   Influenza A NOT DETECTED NOT DETECTED Final   Influenza B NOT DETECTED NOT DETECTED Final   Parainfluenza Virus 1 NOT DETECTED NOT DETECTED Final   Parainfluenza Virus 2 NOT DETECTED NOT DETECTED Final   Parainfluenza Virus 3 NOT DETECTED NOT DETECTED Final   Parainfluenza Virus 4 NOT DETECTED NOT DETECTED Final   Respiratory Syncytial Virus NOT DETECTED NOT DETECTED Final   Bordetella pertussis NOT DETECTED NOT DETECTED Final   Bordetella Parapertussis NOT DETECTED NOT DETECTED Final   Chlamydophila pneumoniae NOT DETECTED NOT DETECTED Final   Mycoplasma pneumoniae NOT DETECTED NOT DETECTED Final    Comment: Performed at [facility], [place]. [address] [place], [place] [address]     Studies: No results found.    Deatra James, MD  [facility] 05/28/2020  If 7PM-7AM, please contact night-coverage

## 2020-05-28 NOTE — Progress Notes (Signed)
Spoke with speech about covering some of the stoma under the Pt's trach to help him with talking. RT place a protective barrier over the open bottom of the stoma just below the trach. The Pt was able to talk with this protective barrier placed

## 2020-05-28 NOTE — Progress Notes (Signed)
  Speech Language Pathology Treatment: Dysphagia  Patient Details Name: Steve [name] MRN: [id number] DOB: December 31, 1955 Today's Date: 05/28/2020 Time: 7793-9030 SLP Time Calculation (min) (ACUTE ONLY): 28 min  Assessment / Plan / Recommendation Clinical Impression  SLP follow up to assess po tolerance as RN states pt advised he felt he had more secretions and was concerned that applesauce was suctioned from his trachea yesterday.  RN and RT deny pt having applesauce suctioned from trachea - only secretions. SLP had just received orange sherbert from RN upon SLP entrance to room.  He reports having jello cups x2, ice cream, milk, and water today.  Denies any coughing associated with po intake or coughing.  Pt has not worn PMSV = RN informed SLP (last evening) that pt was frustrated last evening with the valve.  Today pt states he has not worn valve due to "not thinking about it'.  He continues with decreased phonation ability due to large stoma causing loss of airflow below trachea.  RT graciously placed items similar to tegederm over stoma which allowed improved phonation initially per RT.  Pt tucking chin allows improved phonation as it closes off the stoma but he again reports increased difficulty breathing with posture.  Pt advised he felt he needed suctioning approx 10 minutes after PMSV placed. SLP messaged RT and she arrived to suction pt.  Upon removal of inner cannula by RT, pt with a mild amount of secretions retained.  Advised pt assure his inner cannula is clear prior to valve placement.    Encouraged pt to use valve and continue po intake as tolerated and comfortable.  Pt is making excellent progress and continues to be able to don/remove his valve and comprehend care/use of valve.  He reports potential plan for dc home tomorrow.  Thanks for allowing me to help care for this pt.    HPI HPI: pt is a 65 yo male with h/o Hodgkin's lymphoma, s/p COVID requiring hospital admit from 03/17/2020  to1/10/2020.  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  Swallow and PMSV ordered received.  Follow up for PMSV tolerance and education for donning, removal, etc indicated.      SLP Plan  Continue with current plan of care       Recommendations  Diet recommendations: Thin liquid (full liquids, soft solids) Liquids provided via: Straw;Cup Medication Administration: Via alternative means Supervision: Patient able to self feed Compensations: Slow rate;Small sips/bites Postural Changes and/or Swallow Maneuvers: Seated upright 90 degrees;Upright 30-60 min after meal      Patient may use Passy-Muir Speech Valve:  (PT able to don and remove PMSV independently but needs oxygen sensory within his view to monitor closely) PMSV Supervision: Intermittent         Oral Care Recommendations: Oral care BID (oral care TID) Follow up Recommendations:  (TBD) SLP Visit Diagnosis: Aphonia (R49.1);Dysphagia, unspecified (R13.10) Plan: Continue with current plan of care       Gardner, Calum Cormier Ann 05/28/2020, 8:39 PM  Kathleen Lime, MS [facility] SLP Acute Rehab Services Office [phone] Pager [phone]

## 2020-05-28 NOTE — TOC Initial Note (Addendum)
Transition of Care [facility]) - Initial/Assessment Note    Patient Details  Name: Steve [name] MRN: [id number] Date of Birth: [birth year]  Transition of Care [facility]) CM/SW Contact:    Dessa Phi, RN Phone Number: 05/28/2020, 8:54 AM  Clinical Narrative: Spoke to spouse about d/ plans, & home dme needed. Confirmed-d/c plan home. Active w/[facility]-HHRN/PT/OT-rep Bryson Corona aware. dme needed & ordered-neb machine, a 2nd 02 concentrator, 02 supplies, percussion vest,trah shiley #6,uncuffed,also rep Thedore Mins to talk to spouse about TF formula double bags-rep Thedore Mins will contact spouse [name] directly to confirm all dme orders, & her specific needs-they will deliver all dme prior patient being d/c from hospital. [facility] for transport-confirmed address. Continue to follow for d/c plans.  3:43p-[employer] rep Zach-all dme has been set up @ home.[facility] rep Britany aware of d/c in am. [facility] for transport in am.              Expected Discharge Plan: [place] Barriers to Discharge: Continued Medical Work up   Patient Goals and CMS Choice Patient states their goals for this hospitalization and ongoing recovery are:: go home CMS Medicare.gov Compare Post Acute Care list provided to:: Patient Represenative (must comment) Choice offered to / list presented to : Spouse  Expected Discharge Plan and Services Expected Discharge Plan: [place]   Discharge Planning Services: CM Consult Post Acute Care Choice: Durable Medical Equipment (Active-[employer]-home 02,concentrator/trach supplies,hospital bed/air mattress,Kangaroo pump TF.Active [facility]-HHRN/PT/OT) Living arrangements for the past 2 months: Single Family Home                 DME Arranged: Nebulizer machine,Vest percussion,Oxygen,Trach supplies (02 concentrator.) DME Agency: [employer] Date DME Agency Contacted: 05/28/20 Time DME Agency Contacted: [phone] Representative spoke with at DME Agency: [place]: RN,PT,OT [place] Agency: [facility] Date [place]: 05/28/20 Time [place]: [phone] Representative spoke with at [place]: [place] Arrangements/Services Living arrangements for the past 2 months: [place] with:: Spouse Patient language and need for interpreter reviewed:: Yes Do you feel safe going back to the place where you live?: Yes      Need for Family Participation in Patient Care: No (Comment) Care giver support system in place?: Yes (comment) Current home services: DME,Home OT,Home PT,Home RN Criminal Activity/Legal Involvement Pertinent to Current Situation/Hospitalization: No - Comment as needed  Activities of Daily Living Home Assistive Devices/Equipment: Oxygen,Other (Comment),Wheelchair,Hoyer Lift,Eyeglasses,Dentures (specify type) (peg tube, compressor , ramp, suction, full dentures) ADL Screening (condition at time of admission) Patient's cognitive ability adequate to safely complete daily activities?: Yes Is the patient deaf or have difficulty hearing?: No Does the patient have difficulty seeing, even when wearing glasses/contacts?: No Does the patient have difficulty concentrating, remembering, or making decisions?: No Patient able to express need for assistance with ADLs?: Yes Does the patient have difficulty dressing or bathing?: Yes Independently performs ADLs?: No Communication: Independent Dressing (OT): Needs assistance Is this a change from baseline?: Pre-admission baseline Grooming: Needs assistance Is this a change from baseline?: Pre-admission baseline Feeding: Needs assistance Is this a change from baseline?: Pre-admission baseline Bathing: Needs assistance Is this a change from baseline?: Pre-admission baseline Toileting: Needs assistance Is this a change from baseline?: Pre-admission baseline In/Out Bed: Needs assistance Is this a change from baseline?: Pre-admission baseline Walks in Home:  Dependent Is this a change from baseline?: Pre-admission baseline Does the patient have difficulty walking or climbing stairs?: Yes Weakness of Legs: Both  Weakness of Arms/Hands: Both  Permission Sought/Granted Permission sought to share information with : Case Manager Permission granted to share information with : Yes, Verbal Permission Granted  Share Information with NAME: Case Manager     Permission granted to share info w Relationship: [name] spouse [phone]     Emotional Assessment Appearance:: Appears stated age Attitude/Demeanor/Rapport: Gracious Affect (typically observed): Accepting Orientation: : Oriented to Self,Oriented to Place,Oriented to  Time,Oriented to Situation Alcohol / Substance Use: Not Applicable Psych Involvement: No (comment)  Admission diagnosis:  Acute on chronic respiratory failure with hypoxia ([name]) [J96.21] Acute respiratory failure with hypoxia ([place]) [J96.01] Patient Active Problem List   Diagnosis Date Noted  . Pulmonary fibrosis ([place]) 05/26/2020  . Bronchiectasis ([place]) 05/26/2020  . Acute respiratory failure with hypoxia ([place]) 05/26/2020  . Acute on chronic respiratory failure with hypoxia ([place]) 05/25/2020  . Uncontrolled type 2 diabetes mellitus with hyperglycemia, with long-term current use of insulin ([place]) 05/25/2020  . Essential hypertension 05/25/2020  . GERD without esophagitis 05/25/2020  . Debility   . Anxiety   . Tracheostomy care ([place])   . Pneumothorax on right 03/23/2020  . Chronic respiratory failure with hypoxia ([place])   . ARDS (adult respiratory distress syndrome) ([place])   . Hodgkin's lymphoma ([place])   . Pneumothorax, acute   . Healthcare-associated pneumonia   . COVID-19 virus infection   . On mechanically assisted ventilation ([place])   . Dysphagia   . Pneumothorax   . Subcutaneous air ([place])   . Status post tracheostomy ([place])   . Pneumomediastinum ([place])   . Acute respiratory distress syndrome (ARDS) due to COVID-19  virus ([place])   . Pressure injury of skin 07/27/2019  . Acute respiratory failure with hypoxemia ([place])   . 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 Malnutrition of moderate degree 07/10/2019  . HCAP (healthcare-associated pneumonia)   . Hypoxia   . Febrile neutropenia ([place]) 05/17/2019  . COVID-19 05/17/2019  . Anemia 05/17/2019  . Hyponatremia 05/17/2019  . Protein-calorie malnutrition, severe ([place]) 05/17/2019  . Neutropenic fever ([employer]) 05/16/2019  . 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 Chemotherapy-induced diarrhea 04/22/2019  . Anemia due to antineoplastic chemotherapy 03/25/2019  . Hodgkin's lymphoma ([place]) 02/25/2019  . Abnormal CT scan, pancreas - tail area 01/31/2013  . 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 Septic arthritis of hip ([place]) 09/05/2012  . Routine general medical examination at a health care facility 09/12/2011  . Elbow pain 09/12/2011  . Abscess of muscle 08/10/2011  . H/O dental abscess 08/10/2011  . Staphylococcus aureus bacteremia 07/27/2011  . Abdominal pain, RLQ 09/02/2010  . HYPOGONADISM, MALE 03/23/2007  . SLEEP APNEA 03/23/2007   PCP:  [name], PA-C Pharmacy:   [id number] - [place], [address] [place] [address] Phone: [phone] Fax: [phone]     Social Determinants of Health (SDOH) Interventions    Readmission Risk Interventions Readmission Risk Prevention Plan 07/22/2019  Transportation Screening Complete  Medication Review (RN Care Manager) Complete  Some recent data might be hidden

## 2020-05-29 ENCOUNTER — Other Ambulatory Visit: Payer: Self-pay | Admitting: *Deleted

## 2020-05-29 ENCOUNTER — Encounter (HOSPITAL_COMMUNITY): Payer: Self-pay | Admitting: Internal Medicine

## 2020-05-29 DIAGNOSIS — C8100 Nodular lymphocyte predominant Hodgkin lymphoma, unspecified site: Secondary | ICD-10-CM

## 2020-05-29 LAB — GLUCOSE, CAPILLARY
Glucose-Capillary: 165 mg/dL — ABNORMAL HIGH (ref 70–99)
Glucose-Capillary: 119 mg/dL — ABNORMAL HIGH (ref 70–99)
Glucose-Capillary: 127 mg/dL — ABNORMAL HIGH (ref 70–99)
Glucose-Capillary: 96 mg/dL (ref 70–99)

## 2020-05-29 MED ORDER — CIPROFLOXACIN HCL 500 MG PO TABS: 500.0000 mg | ORAL_TABLET | Freq: Two times a day (BID) | ORAL | 0 refills | Status: AC

## 2020-05-29 MED ORDER — ORAL CARE MOUTH RINSE: 15.0000 mL | Freq: Two times a day (BID) | OROMUCOSAL | 0 refills | Status: AC

## 2020-05-29 MED ORDER — HEPARIN SOD (PORK) LOCK FLUSH 100 UNIT/ML IV SOLN
500.0000 [IU] | INTRAVENOUS | Status: AC | PRN
  Administered 2020-05-29: 500 [IU]
  Filled 2020-05-29: qty 5

## 2020-05-29 MED ORDER — SODIUM CHLORIDE 3 % IN NEBU: 4.0000 mL | INHALATION_SOLUTION | Freq: Two times a day (BID) | RESPIRATORY_TRACT | 12 refills | Status: DC

## 2020-05-29 NOTE — Discharge Summary (Signed)
Physician Discharge Summary [facility]    [name]                   Admit date: 05/25/2020   DOB: October 27, 1955             Discharge date:05/29/2020/8:20 AM FIE:[id number]                          PCP: [name], PA-C  Disposition: HOME with Home Health   Recommendations for Outpatient Follow-up:   . Follow up: in 2 weeks  Discharge Condition: Stable   Code Status:   Code Status: Full Code  Diet recommendation: Continue tube feeds   Discharge Diagnoses:    Principal Problem:   Acute on chronic respiratory failure with hypoxia ([place]) Active Problems:   Hodgkin's lymphoma ([place])   Uncontrolled type 2 diabetes mellitus with hyperglycemia, with long-term current use of insulin ([place])   Essential hypertension   GERD without esophagitis   Pulmonary fibrosis ([name])   Bronchiectasis ([name])   Acute respiratory failure with hypoxia ([place])   History of Present Illness/ Hospital Course Kathleen Argue Summary:    [name] with past medical history of lymphoma, hypertension, gastroesophageal reflux disease, insulin-dependent diabetes mellitus type 2, status post PEG tube placement with tube feeds and 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  Of note, patient was recently at [facility] a week back and since discharge she has been having more cough and copious sputum production.  He also had shortness of breath, hypoxia with choking spell so decided come to the hospital Review of previous records showed previous hospitalization for COVID ARDS with bilateral pneumothorax and tension pneumomediastinum..Patient was  hospitalized for an extended period of time from 12/13 2021 until 04/17/2020 but during this hospitalization, he was weaned off the ventilator to trach collar.  On this  admission, CT scan of the chest was performed which showed extensive pulmonary fibrosis and bronchiectasis but no evidence of new pneumonia.  Patient was then considered for admission to the hospital for further evaluation and treatment.  Hemodynamically stable afebrile normotensive --oxygen via trach at baseline 10 L With proper trach hygiene O2 sat has improved greater 92% Sputum culture positive for Pseudomonas   Acute on chronic respiratory failure with hypoxia  Currently on tracheostomy.   -Continue to have copious sputum production, -On 10 L of oxygen this morning was satting low 80s but awake alert  -Pulmonary/critical care team has seen evaluate patient, for suction entirely -Obtain sputum for culture >> positive for Pseudomonas, sensitive to quinolones, will switch from p.o. Levaquin to ciprofloxacin, per pulmonary to be continued for total of 14 days  - hypertonic saline nebs valve therapy twice a day  -Pulmonary critical following, recommending continuing 40% ATC,  -Continue hypertonic saline nebs -Continue mucolytic's -As needed trach suction-wife has been trained by nursing staff   -Mild leukocytosis but no fever.  -Blood cultures negative to date - Respiratory viral panel negative.    -Procalcitonin less than 0.10. Covid test and flu was negative. Mild leukocytosis.  -Currently on IV vancomycin and cefepime >> has been switched to p.o. Levaquin  Hodgkin's lymphoma -Remained stable Classic Hodgkin's lymphoma, nodular sclerosis subtype stage III diagnosed 02/2019. Follows up with Dr.Zhao, currently not on treatment. CT imaging of the chest performed during  this hospitalization reveals progressive adenopathy. Will need to follow-up with oncology as outpatient  Uncontrolled type 2 diabetes mellitus with hyperglycemia, On tube feeds, Accu-Cheks, sliding scale insulin. -Hemoglobin A1c 5.8  Pulmonary fibrosis/bronchiectasis Extensive postinfection pulmonary  fibrosis and bronchiectasis, patient is debilitated.  Currently on trach collar needs suctioning and trach care management. -Continue to be followed by pulmonary  Dysphagia Currently on tube feeds at home. Speech therapy for swallow evaluation currently NPO.  GERD without esophagitis  PPI   DVT prophylaxis: enoxaparin (LOVENOX) injection 40 mg Start: 05/26/20 1000   Code Status: Full code  Family Communication: Spoke with the patient's spouse on the phone and updated her about the clinical condition of the patient  Dispo: The patient is from: Home  Anticipated d/c is to: Home with home health   Nutritional status:  Nutrition Problem: Moderate Malnutrition Etiology: chronic illness,cancer and cancer related treatments,other (see comment) (post-COVID) Signs/Symptoms: moderate fat depletion,moderate muscle depletion Interventions: Tube feeding  The patient's BMI is: Body mass index is 24.79 kg/m. I agree with the assessment and plan ...     Discharge Instructions:   Discharge Instructions    Activity as tolerated - No restrictions   Complete by: As directed    Call MD for:  difficulty breathing, headache or visual disturbances   Complete by: As directed    Call MD for:  persistant nausea and vomiting   Complete by: As directed    Call MD for:  redness, tenderness, or signs of infection (pain, swelling, redness, odor or green/yellow discharge around incision site)   Complete by: As directed    Diet - low sodium heart healthy   Complete by: As directed    Discharge instructions   Complete by: As directed    Follow-up with pulmonary, repeat imaging as scheduled, continue frequent trach hygiene, especially suction... Instructions provided   Increase activity slowly   Complete by: As directed        Medication List    STOP taking these medications   pantoprazole sodium 40 mg/20 mL Pack Commonly known as: PROTONIX     TAKE these medications    acetaminophen 160 MG/5ML solution Commonly known as: TYLENOL Place 20.3 mLs (650 mg total) into feeding tube every 4 (four) hours as needed for mild pain or fever (temp > 101.5).   albuterol (2.5 MG/3ML) 0.083% nebulizer solution Commonly known as: PROVENTIL Take 3 mLs (2.5 mg total) by nebulization every 2 (two) hours as needed for wheezing.   chlorhexidine 0.12 % solution Commonly known as: PERIDEX 15 mLs by Mouth Rinse route 2 (two) times daily.   Chlorhexidine Gluconate Cloth 2 % Pads Apply 6 each topically daily.   ciprofloxacin 500 MG tablet Commonly known as: CIPRO Place 1 tablet (500 mg total) into feeding tube every 12 (twelve) hours for 10 days.   enoxaparin 40 MG/0.4ML injection Commonly known as: LOVENOX Inject 0.4 mLs (40 mg total) into the skin daily.   feeding supplement (PRO-STAT SUGAR FREE 64) Liqd Place 30 mLs into feeding tube daily.   feeding supplement (PROSource TF) liquid Place 45 mLs into feeding tube 3 (three) times daily.   feeding supplement (OSMOLITE 1.2 CAL) Liqd Place 1,000 mLs into feeding tube continuous.   ferrous sulfate 300 (60 Fe) MG/5ML syrup Place 5 mLs (300 mg total) into feeding tube daily with breakfast.   Fiasp 100 UNIT/ML Soln Generic drug: Insulin Aspart (w/Niacinamide) Inject 5 Units into the skin 3 (three) times daily.   free water Soln  Place 100 mLs into feeding tube every 4 (four) hours.   furosemide 20 MG tablet Commonly known as: LASIX Place 20 mg into feeding tube daily.   insulin aspart 100 UNIT/ML injection Commonly known as: novoLOG Inject 0-15 Units into the skin 3 (three) times daily with meals.   lansoprazole 30 MG capsule Commonly known as: PREVACID Place 30 mg into feeding tube daily at 12 noon.   LORazepam 0.5 MG tablet Commonly known as: ATIVAN Place 1 tablet (0.5 mg total) into feeding tube every 4 (four) hours as needed for anxiety.   mouth rinse Liqd solution 15 mLs by Mouth Rinse route 2  times daily at 12 noon and 4 pm.   multivitamin Liqd Place 15 mLs into feeding tube daily. What changed: when to take this   sertraline 50 MG tablet Commonly known as: ZOLOFT Place 1 tablet (50 mg total) into feeding tube daily.   sodium chloride HYPERTONIC 3 % nebulizer solution Take 4 mLs by nebulization every 12 (twelve) hours.   traZODone 100 MG tablet Commonly known as: DESYREL Place 100 mg into feeding tube at bedtime.   triamcinolone 0.1 % Commonly known as: KENALOG Apply 1 application topically 2 (two) times daily as needed (itching).   Vitamin D3 10 MCG/ML Liqd Give 400 Units by tube daily. What changed: how to take this       [facility] Follow up.   Why: Percussion vest,oxygen concentrator,02 supplies/trach supplies-trach #6 shiley,neb machine. Contact information: [place] [address] [phone]        [facility] Follow up.   Specialty: Home Health Services Why: [facility] nursing/physical therapy/occupational therapy Contact information: [address] [place] [address] [phone]              No Known Allergies   Procedures /Studies:   CT Angio Chest PE W/Cm &/Or Wo Cm  Result Date: 05/25/2020 CLINICAL DATA:  PE suspected, low/intermediate prob, positive D-dimer Shortness of breath with decreased oxygen saturation. EXAM: CT ANGIOGRAPHY CHEST WITH CONTRAST TECHNIQUE: Multidetector CT imaging of the chest was performed using the standard protocol during bolus administration of intravenous contrast. Multiplanar CT image reconstructions and MIPs were obtained to evaluate the vascular anatomy. CONTRAST:  8mL OMNIPAQUE IOHEXOL 350 MG/ML SOLN COMPARISON:  Radiograph earlier today. Chest CT 03/24/2020. PET CT 06/14/2019 FINDINGS: Cardiovascular: There are no filling defects within the pulmonary arteries to suggest pulmonary embolus. Subsegmental branches are not  well assessed due to breathing motion artifact. Dilatation of the main pulmonary artery at 3.5 cm. Mild aortic atherosclerosis. No acute aortic findings. Left chest port with tip at the atrial caval junction. Mild cardiomegaly. No pericardial effusion. Mediastinum/Nodes: Extensive mediastinal adenopathy that has progressed from prior exam. Representative node or nodal conglomerate in the right lower paratracheal station measures 3.9 x 4.0 cm, previously 3.2 x 3.6 cm. High right mediastinal/low supraclavicular node measures 2.3 cm short axis, previously 1.7 cm by my retrospective measurement. Progressive soft tissue density presumed nodal tissue in the right anterior mediastinum anterior to the aorta and IVC. Enlarged right internal mammary node adjacent to the lower sternum measures 2.1 cm. No axillary adenopathy. Tracheostomy with tip above the carina. Minimal retained mucus in the trachea. No esophageal wall thickening. No visualized thyroid nodule. Lungs/Pleura: Extensive chronic lung disease with multifocal bronchiectasis, peripheral honeycombing, and diffuse ground-glass opacities throughout the lung parenchyma. Calcified granuloma in the left lower lobe. No evidence  of acute airspace disease. No significant pleural effusion. Previous pigtail catheter in the right anterior chest has been removed. No pneumothorax. Upper Abdomen: Small retrosternal lymph node measures 6 mm, series 4, image 99. Limited imaging of the upper abdomen without other acute finding. Musculoskeletal: No acute osseous abnormality or focal bone lesion. Thoracic spine spondylosis. Review of the MIP images confirms the above findings. IMPRESSION: 1. No pulmonary embolus. Dilatation of the main pulmonary artery consistent with pulmonary arterial hypertension. 2. Progressive thoracic adenopathy. This is greater than typically seen with reactive adenopathy, and suspicious for Hodgkin's lymphoma recurrence. 3. Extensive interstitial lung disease  with pulmonary fibrosis and bronchiectasis. No acute pulmonary findings. Aortic Atherosclerosis (ICD10-I70.0). Electronically Signed   By: Keith Rake M.D.   On: 05/25/2020 20:44   DG Chest Port 1 View  Result Date: 05/25/2020 CLINICAL DATA:  Shortness of breath. EXAM: PORTABLE CHEST 1 VIEW COMPARISON:  Most recent radiograph 04/07/2020 FINDINGS: Tracheostomy tube tip at the thoracic inlet. Left chest port remains in place. Chronic elevation of right hemidiaphragm. Interstitial lung disease with peripheral predominant reticulation, chronic and unchanged. No new airspace disease. Stable cardiomegaly and mediastinal contours. No pneumothorax. IMPRESSION: 1. Stable chronic interstitial lung disease. No new abnormalities. 2. Stable cardiomegaly. 3. Tracheostomy tube unchanged with tip at the thoracic inlet. Electronically Signed   By: Keith Rake M.D.   On: 05/25/2020 18:56     Subjective:   Patient was seen and examined 05/29/2020, 8:20 AM Patient stable today. No acute distress.  No issues overnight Stable for discharge.  Discharge Exam:    Vitals:   05/28/20 2348 05/29/20 0209 05/29/20 0500 05/29/20 0523  BP: 110/63   100/66  Pulse: 77 82  89  Resp:  16  16  Temp:    98.9 F (37.2 C)  TempSrc:    Oral  SpO2:  98%  100%  Weight:   67.6 kg   Height:        General: Pt lying comfortably in bed & appears in no obvious distress.  Trach, functional, on 10 L of oxygen, PEG tube functional ... Able to communicate Cardiovascular: S1 & S2 heard, RRR, S1/S2 +. No murmurs, rubs, gallops or clicks. No JVD or pedal edema. Respiratory: Via tracheostomy, excessive upper respiratory secretion-clear to auscultation without wheezing, rhonchi or crackles. No increased work of breathing. Abdominal:  Non-distended, non-tender & soft. No organomegaly or masses appreciated. Normal bowel sounds heard.  PEG tube in place CNS: Alert and oriented. No focal deficits. Extremities: no edema, no cyanosis,  severe generalized weaknesses      The results of significant diagnostics from this hospitalization (including imaging, microbiology, ancillary and laboratory) are listed below for reference.      Microbiology:   Recent Results (from the past 240 hour(s))  Expectorated Sputum Assessment w Gram Stain, Rflx to Resp Cult     Status: None   Collection Time: 05/25/20  1:06 AM   Specimen: Sputum  Result Value Ref Range Status   Specimen Description SPUTUM  Final   Special Requests NONE  Final   Sputum evaluation   Final    Sputum specimen not acceptable for testing.  Please recollect.   CALLED TO [facility] @ [phone] ON 05/26/20 C VARNER Performed at [facility], [place] [address], [place], [place] [address]    Report Status 05/26/2020 FINAL  Final  Culture, blood (routine x 2)     Status: None (Preliminary result)   Collection Time: 05/25/20  7:00  PM   Specimen: BLOOD  Result Value Ref Range Status   Specimen Description   Final    BLOOD PORTA CATH Performed at [facility] [address], [place], [place] [address]    Special Requests   Final    BOTTLES DRAWN AEROBIC AND ANAEROBIC Blood Culture adequate volume Performed at [facility] [address], [place], [place] [address]    Culture   Final    NO GROWTH 3 DAYS Performed at [facility], [place] [address], [place], [place] [address]    Report Status PENDING  Incomplete  Resp Panel by RT-PCR (Flu A&B, Covid) Nasopharyngeal Swab     Status: None   Collection Time: 05/25/20  7:28 PM   Specimen: Nasopharyngeal Swab; Nasopharyngeal(NP) swabs in vial transport medium  Result Value Ref Range Status   SARS Coronavirus 2 by RT PCR NEGATIVE NEGATIVE Final    Comment: (NOTE) SARS-CoV-2 target nucleic acids are NOT DETECTED.  The SARS-CoV-2 RNA is generally detectable in upper respiratory specimens during the acute phase of infection. The lowest concentration of  SARS-CoV-2 viral copies this assay can detect is 138 copies/mL. A negative result does not preclude SARS-Cov-2 infection and should not be used as the sole basis for treatment or other patient management decisions. A negative result may occur with  improper specimen collection/handling, submission of specimen other than nasopharyngeal swab, presence of viral mutation(s) within the areas targeted by this assay, and inadequate number of viral copies(<138 copies/mL). A negative result must be combined with clinical observations, patient history, and epidemiological information. The expected result is Negative.  Fact Sheet for Patients:  EntrepreneurPulse.com.au  Fact Sheet for Healthcare Providers:  IncredibleEmployment.be  This test is no t yet approved or cleared by the [place] FDA and  has been authorized for detection and/or diagnosis of SARS-CoV-2 by FDA under an Emergency Use Authorization (EUA). This EUA will remain  in effect (meaning this test can be used) for the duration of the COVID-19 declaration under Section 564(b)(1) of the Act, 21 U.S.C.section 360bbb-3(b)(1), unless the authorization is terminated  or revoked sooner.       Influenza A by PCR NEGATIVE NEGATIVE Final   Influenza B by PCR NEGATIVE NEGATIVE Final    Comment: (NOTE) The Xpert Xpress SARS-CoV-2/FLU/RSV plus assay is intended as an aid in the diagnosis of influenza from Nasopharyngeal swab specimens and should not be used as a sole basis for treatment. Nasal washings and aspirates are unacceptable for Xpert Xpress SARS-CoV-2/FLU/RSV testing.  Fact Sheet for Patients: EntrepreneurPulse.com.au  Fact Sheet for Healthcare Providers: IncredibleEmployment.be  This test is not yet approved or cleared by the [place] FDA and has been authorized for detection and/or diagnosis of SARS-CoV-2 by FDA under an Emergency Use  Authorization (EUA). This EUA will remain in effect (meaning this test can be used) for the duration of the COVID-19 declaration under Section 564(b)(1) of the Act, 21 U.S.C. section 360bbb-3(b)(1), unless the authorization is terminated or revoked.  Performed at [facility], [place] [address] [place], [place] [address]   Culture, blood (routine x 2)     Status: None (Preliminary result)   Collection Time: 05/25/20  8:25 PM   Specimen: BLOOD  Result Value Ref Range Status   Specimen Description   Final    BLOOD PORTA CATH Performed at [facility] [address], [place],  [address]    Special Requests   Final  BOTTLES DRAWN AEROBIC AND ANAEROBIC Blood Culture adequate volume Performed at [facility] [address], [place], [place] [address]    Culture   Final    NO GROWTH 3 DAYS Performed at [facility], [place] [address], [place], [place] [address]    Report Status PENDING  Incomplete  Culture, Respiratory w Gram Stain     Status: None   Collection Time: 05/26/20  3:20 AM   Specimen: Tracheal Aspirate; Respiratory  Result Value Ref Range Status   Specimen Description   Final    TRACHEAL ASPIRATE Performed at [facility] [address] [place], [place] [address]    Special Requests   Final    NONE Performed at [facility], [place] [address] [place], [place] [address]    Gram Stain   Final    RARE WBC PRESENT, PREDOMINANTLY MONONUCLEAR NO ORGANISMS SEEN Performed at [facility], [place] [address], [place], [place] [address]    Culture FEW PSEUDOMONAS AERUGINOSA  Final   Report Status 05/28/2020 FINAL  Final   Organism ID, Bacteria PSEUDOMONAS AERUGINOSA  Final      Susceptibility   Pseudomonas aeruginosa - MIC*    CEFTAZIDIME 8 SENSITIVE Sensitive     CIPROFLOXACIN <=0.25 SENSITIVE Sensitive     GENTAMICIN <=1 SENSITIVE Sensitive     IMIPENEM 1  SENSITIVE Sensitive     * FEW PSEUDOMONAS AERUGINOSA  Respiratory (~20 pathogens) panel by PCR     Status: None   Collection Time: 05/26/20  5:38 AM   Specimen: Tracheal Aspirate; Respiratory  Result Value Ref Range Status   Adenovirus NOT DETECTED NOT DETECTED Final   Coronavirus 229E NOT DETECTED NOT DETECTED Final    Comment: (NOTE) The Coronavirus on the Respiratory Panel, DOES NOT test for the novel  Coronavirus (2019 nCoV)    Coronavirus HKU1 NOT DETECTED NOT DETECTED Final   Coronavirus NL63 NOT DETECTED NOT DETECTED Final   Coronavirus OC43 NOT DETECTED NOT DETECTED Final   Metapneumovirus NOT DETECTED NOT DETECTED Final   Rhinovirus / Enterovirus NOT DETECTED NOT DETECTED Final   Influenza A NOT DETECTED NOT DETECTED Final   Influenza B NOT DETECTED NOT DETECTED Final   Parainfluenza Virus 1 NOT DETECTED NOT DETECTED Final   Parainfluenza Virus 2 NOT DETECTED NOT DETECTED Final   Parainfluenza Virus 3 NOT DETECTED NOT DETECTED Final   Parainfluenza Virus 4 NOT DETECTED NOT DETECTED Final   Respiratory Syncytial Virus NOT DETECTED NOT DETECTED Final   Bordetella pertussis NOT DETECTED NOT DETECTED Final   Bordetella Parapertussis NOT DETECTED NOT DETECTED Final   Chlamydophila pneumoniae NOT DETECTED NOT DETECTED Final   Mycoplasma pneumoniae NOT DETECTED NOT DETECTED Final    Comment: Performed at [facility], [place]. [address] [place],  [address]     Labs:   CBC: Recent Labs  Lab 05/25/20 1742 05/26/20 0535 05/27/20 1113  WBC 14.3* 12.5* 9.6  NEUTROABS 10.7* 8.0*  --   HGB 11.0* 9.8* 9.0*  HCT 35.4* 32.2* 29.3*  MCV 89.8 91.7 91.3  PLT 228 218 616   Basic Metabolic Panel: Recent Labs  Lab 05/25/20 1742 05/26/20 0535 05/27/20 1113  NA 138 137 136  K 4.5 3.9 3.8  CL 95* 99 97*  CO2 33* 30 32  GLUCOSE 122* 108* 161*  BUN 29* 20 17  CREATININE 0.53* 0.56* 0.62  CALCIUM 9.5 9.1 8.4*  MG  --  1.7 1.7  PHOS  --   --  2.6   Liver Function  Tests: Recent Labs  Lab 05/25/20 1742 05/26/20 0535 05/27/20 1113  AST 23 20 17   ALT 25 22 19   ALKPHOS 61 49 53  BILITOT 0.5 0.7 0.5  PROT 7.9 7.0 6.5  ALBUMIN 3.0* 2.6* 2.4*   BNP (last 3 results) Recent Labs    07/08/19 1815 07/11/19 0212 07/14/19 0458  BNP 407.1* 224.8* 55.9   Cardiac Enzymes: No results for input(s): CKTOTAL, CKMB, CKMBINDEX, TROPONINI in the last 168 hours. CBG: Recent Labs  Lab 05/28/20 0544 05/28/20 1131 05/28/20 1805 05/28/20 2344 05/29/20 0526  GLUCAP 119* 124* 129* 147* 165*   Hgb A1c No results for input(s): HGBA1C in the last 72 hours. Lipid Profile No results for input(s): CHOL, HDL, LDLCALC, TRIG, CHOLHDL, LDLDIRECT in the last 72 hours. Thyroid function studies No results for input(s): TSH, T4TOTAL, T3FREE, THYROIDAB in the last 72 hours.  Invalid input(s): FREET3 Anemia work up No results for input(s): VITAMINB12, FOLATE, FERRITIN, TIBC, IRON, RETICCTPCT in the last 72 hours. Urinalysis    Component Value Date/Time   COLORURINE YELLOW 05/25/2020 0106   APPEARANCEUR CLEAR 05/25/2020 0106   LABSPEC 1.017 05/25/2020 0106   PHURINE 6.0 05/25/2020 0106   GLUCOSEU NEGATIVE 05/25/2020 0106   HGBUR SMALL (A) 05/25/2020 0106   HGBUR negative 03/23/2007 0000   BILIRUBINUR NEGATIVE 05/25/2020 0106   BILIRUBINUR neg 11/11/2016 1017   KETONESUR NEGATIVE 05/25/2020 0106   PROTEINUR NEGATIVE 05/25/2020 0106   UROBILINOGEN 0.2 11/11/2016 1017   UROBILINOGEN 0.2 11/08/2012 0915   NITRITE NEGATIVE 05/25/2020 0106   LEUKOCYTESUR NEGATIVE 05/25/2020 0106         Time coordinating discharge: Over 53 minutes  SIGNED: Deatra James, MD, FACP, FHM. [facility],  Please use [email or web address] to Page If 7PM-7AM, please contact night-coverage [email or web address], Password [facility] 05/29/2020, 8:20 AM

## 2020-05-29 NOTE — Progress Notes (Signed)
OT Cancellation Note  Patient Details Name: Steve [name] MRN: [id number] DOB: 04/02/1956   Cancelled Treatment:    Reason Eval/Treat Not Completed: Patient declined, no reason specified. Patient politely declining OT today, reports he is going home and does not want to try to get to chair at this time. Is interested in working with therapy at home. Recommend HHOT services at D/C.  [name] OT OT pager: [phone] 05/29/2020, 9:11 AM

## 2020-05-29 NOTE — Plan of Care (Signed)

## 2020-05-29 NOTE — Progress Notes (Signed)
CPT held, PTAR for transport of pt. back to facility.

## 2020-05-29 NOTE — TOC Transition Note (Addendum)
Transition of Care [facility]) - CM/SW Discharge Note   Patient Details  Name: DELVIN HEDEEN MRN: [id number] Date of Birth: 22-Aug-1955  Transition of Care [facility]) CM/SW Contact:  Dessa Phi, RN Phone Number: 05/29/2020, 8:45 AM   Clinical Narrative: d/c home today with [employer] dme to arrive in home prior d/c-[name] spouse will call CM to inform once dme has arrived-then CM will contact [facility] for transport home. [facility] for [facility] services. No further CM needs. 2:58p-[facility] called.      Final next level of care: [place] Barriers to Discharge: No Barriers Identified   Patient Goals and CMS Choice Patient states their goals for this hospitalization and ongoing recovery are:: go home CMS Medicare.gov Compare Post Acute Care list provided to:: Patient Represenative (must comment) Choice offered to / list presented to : Spouse  Discharge Placement                       Discharge Plan and Services   Discharge Planning Services: CM Consult Post Acute Care Choice: Durable Medical Equipment (Active-[employer]-home 02,concentrator/trach supplies,hospital bed/air mattress,Kangaroo pump TF.Active [facility]-HHRN/PT/OT)          DME Arranged: Nebulizer machine,Vest percussion,Oxygen,Trach supplies (02 concentrator.) DME Agency: [employer] Date DME Agency Contacted: 05/28/20 Time DME Agency Contacted: [phone] Representative spoke with at DME Agency: [place]: RN,PT,OT [place] Agency: [facility] Date [place]: 05/28/20 Time [place]: [phone] Representative spoke with at [place]: [place] Determinants of Health ([place]) Interventions     Readmission Risk Interventions Readmission Risk Prevention Plan 07/22/2019  Transportation Screening Complete  Medication Review Press photographer) Complete  Some recent data might be hidden

## 2020-05-29 NOTE — Consult Note (Addendum)
[place]  Telephone:([phone] Fax:([phone]) [phone]   MEDICAL ONCOLOGY - INITIAL CONSULTATION  Referral MD: Dr. Skipper Cliche  Reason for Referral: History of Hodgkin's lymphoma  HEME/ONC OVERVIEW: 1. Classic Hodgkin lymphoma, nodular sclerosis subtype; at least Stage III -02/2019: enlarged R supraclavicular LN's (~4 and 3cm) on neck US  ? Enlarged cervical, bulky R supraclavicular, mediastinal, and RP adenopathy ? Core R cervical LN bx non-diagnostic  ? Excisional LN bx showed classic Hodgkin lymphoma, nodular sclerosis subtype  ? FDG-avid LN's in neck, chest and abdomen; no bony abnormalities  -Late 03/2019 - present: AVD + Adcetris ? 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  He presented to the emergency room via EMS with hypoxia and respiratory distress at home.  Of note, the patient underwent 2 lengthy hospitalizations in 2021.  The first was in March 2021 until May 2021 for post Covid arts with advanced respiratory failure complicated by bilateral pneumothoraces as well as a tension pneumomediastinum.  He was discharged to [facility] skilled facility as he was status post tracheostomy and PEG tube placement.  The patient was ventilator dependent at that time and remained so for several months.  The patient was again hospitalized 03/23/2020 through 04/17/2020.  During this hospitalization, he was weaned off the ventilator to the trach collar.  The patient was recently discharged from [facility] to home about 1 week prior to this admission.  At home,  the patient has been experiencing frequent bouts of cough with copious amounts of sputum production with tracheostomy.  He has also been having bouts of severe shortness of breath and transient hypoxia as well as a sensation of choking.  In the emergency room, a CTA chest was obtained which showed no PE, dilatation of the main pulmonary artery consistent with pulmonary arterial hypertension, progressive thoracic adenopathy which is greater than typically seen with reactive adenopathy and suspicious for Hodgkin's lymphoma recurrence, extensive interstitial lung disease with pulmonary fibrosis and bronchiectasis.  When seen today, the patient is resting quietly in his bed.  No family at the bedside.  He tells me that he is feeling much better today.  His breathing has improved and he has less drainage from his tracheostomy.  The patient reports that he has lost significant weight over the past year due to his multiple hospitalizations and acute illnesses.  He is currently receiving tube feedings.  He denies headaches, dizziness, chest pain, abdominal pain, nausea, vomiting.  No recent fevers or chills.  No night sweats.  The patient has been deemed stable for discharge by his hospitalist and will discharge to home later today. Medical oncology was asked to the patient made recommendations regarding his Hodgkin's lymphoma.   Past Medical History:  Diagnosis Date  . Abscess of muscle 08/10/2011   staph infection of right hip   . Acute on chronic respiratory failure with hypoxia ([place])   . Acute respiratory distress syndrome (ARDS) due to COVID-19 virus ([place])   . Anxiety   . COVID-19 virus infection   . Diabetes mellitus without complication ([place])   . GERD (gastroesophageal reflux disease)    rare reflux -  no meds for reflux - NO PROBLEM IN PAST SEVERAL YRS  . HCAP (healthcare-associated pneumonia)   . Healthcare-associated pneumonia   . Hip dysplasia, congenital    no surgery as a child for hip dysplasia  - has had bilateral hip replacements as an adult  . Hodgkin lymphoma ([place])   . Hodgkin's lymphoma ([place])   . Hypertension   . Pancreatitis   . Pneumothorax, acute   . Postoperative anemia due to acute blood loss 09/07/2012  . Septic arthritis of hip ([place]) 09/05/2012   PT'S TOTAL HIP JOINT REMOVED - ANTIBIOTIC SPACE PLACED AND PT HAS FINISHED IV ANTIBIOTICS ( PICC LINE REMOVED)  . Sleep apnea    USES CPAP  :  Past Surgical History:  Procedure Laterality Date  . COLONOSCOPY  04/26/2007  . HERNIA REPAIR     inguinal hernia x3  . IR GASTROSTOMY TUBE MOD SED  08/22/2019  . IR IMAGING GUIDED PORT INSERTION  03/29/2019  . JOINT REPLACEMENT  2002 & 2007   bilateral hip replacement  . LYMPH NODE BIOPSY Right 03/20/2019   Procedure: DEEP RIGHT SUPRACLAVICULAR LYMPH NODE EXCISION;  Surgeon: Fanny Skates, MD;  Location: [place];  Service: General;  Laterality: Right;  . MULTIPLE EXTRACTIONS WITH ALVEOLOPLASTY  07/28/2011   Procedure: MULTIPLE EXTRACION WITH ALVEOLOPLASTY;  Surgeon: Lenn Cal, DDS;  Location: [facility];  Service: Oral Surgery;  Laterality: N/A;  Extraction of tooth #'s 2,3,4,5,6,11,12,13,15,19,22 with alveoloplasty.  . shoulder repair - right for separation of shoulder    . TEE WITHOUT CARDIOVERSION  07/29/2011   Procedure: TRANSESOPHAGEAL ECHOCARDIOGRAM (TEE);  Surgeon: Josue Hector, MD;  Location: [place];  Service: Cardiovascular;  Laterality: N/A;  . TOTAL HIP REVISION Right 09/05/2012   Procedure: RIGHT HIP RESECTION ARTHROPLASTY WITH ANTIBIOTIC SPACERS;  Surgeon: [name], MD;  Location: [facility];  Service: Orthopedics;  Laterality: Right;  . TOTAL HIP REVISION Right 11/30/2012   Procedure: RIGHT TOTAL HIP ARTHROPLASTY REIMPLANTATION;  Surgeon: [name], MD;  Location: [facility];  Service: Orthopedics;  Laterality: Right;  :  Current Facility-Administered Medications  Medication Dose Route Frequency Provider Last Rate Last Admin  .  acetaminophen (TYLENOL) tablet 650 mg  650 mg Oral Q6H PRN Shalhoub, Sherryll Burger, MD       Or  . acetaminophen (TYLENOL) suppository 650 mg  650 mg Rectal Q6H PRN Shalhoub, Sherryll Burger, MD      . chlorhexidine gluconate (MEDLINE KIT) (PERIDEX) 0.12 % solution 15 mL  15 mL Mouth Rinse BID Shalhoub, Sherryll Burger, MD   15 mL at 05/29/20 1012  . chlorhexidine gluconate (MEDLINE KIT) (PERIDEX) 0.12 % solution 15 mL  15 mL Mouth Rinse BID Pokhrel, Laxman, MD   15 mL at 05/29/20 0738  . Chlorhexidine Gluconate Cloth 2 % PADS 6 each  6 each Topical Daily Pokhrel, Laxman, MD   6 each at 05/29/20 1012  . ciprofloxacin (CIPRO) tablet 500 mg  500 mg Per Tube Q12H Erick Colace, NP   500 mg at 05/29/20 1010  . enoxaparin (LOVENOX) injection 40 mg  40 mg Subcutaneous Q24H Shalhoub, Sherryll Burger, MD   40 mg at 05/29/20 1012  . feeding supplement (OSMOLITE 1.2 CAL) liquid 1,000 mL  1,000 mL Per Tube Continuous Shahmehdi, Seyed A, MD 65 mL/hr at 05/29/20 0131 1,000 mL at 05/29/20 0131  . feeding supplement (PROSource TF) liquid 45 mL  45 mL Per Tube TID Shahmehdi, Valeria Batman, MD   45 mL  at 05/29/20 1013  . ferrous sulfate 300 (60 Fe) MG/5ML syrup 300 mg  300 mg Per Tube Q breakfast Shalhoub, Sherryll Burger, MD   300 mg at 05/29/20 0738  . free water 100 mL  100 mL Per Tube Q4H Shalhoub, Sherryll Burger, MD   100 mL at 05/29/20 0800  . guaiFENesin (ROBITUSSIN) 100 MG/5ML solution 300 mg  15 mL Per Tube Q6H Freddi Starr, MD   300 mg at 05/29/20 0536  . insulin aspart (novoLOG) injection 0-15 Units  0-15 Units Subcutaneous Q6H Shalhoub, Sherryll Burger, MD   5 Units at 05/29/20 0536  . ipratropium-albuterol (DUONEB) 0.5-2.5 (3) MG/3ML nebulizer solution 3 mL  3 mL Nebulization Q4H PRN Shalhoub, Sherryll Burger, MD      . ipratropium-albuterol (DUONEB) 0.5-2.5 (3) MG/3ML nebulizer solution 3 mL  3 mL Nebulization Q6H Shalhoub, Sherryll Burger, MD   3 mL at 05/29/20 0740  . LORazepam (ATIVAN) tablet 0.5 mg  0.5 mg Per Tube Q4H PRN Shalhoub, Sherryll Burger, MD      .  MEDLINE mouth rinse  15 mL Mouth Rinse 10 times per day Pokhrel, Laxman, MD   15 mL at 05/29/20 1014  . ondansetron (ZOFRAN) tablet 4 mg  4 mg Oral Q6H PRN Shalhoub, Sherryll Burger, MD       Or  . ondansetron [facility]) injection 4 mg  4 mg Intravenous Q6H PRN Shalhoub, Sherryll Burger, MD      . pantoprazole sodium (PROTONIX) 40 mg/20 mL oral suspension 40 mg  40 mg Per Tube Daily Pokhrel, Laxman, MD   40 mg at 05/29/20 1017  . polyethylene glycol (MIRALAX / GLYCOLAX) packet 17 g  17 g Oral Daily PRN Shalhoub, Sherryll Burger, MD      . sertraline (ZOLOFT) tablet 50 mg  50 mg Per Tube Daily Shalhoub, Sherryll Burger, MD   50 mg at 05/29/20 1010  . sodium chloride HYPERTONIC 3 % nebulizer solution 4 mL  4 mL Nebulization Q12H Green, Terri L, RPH   4 mL at 05/29/20 0741  . triamcinolone 0.1 % cream : eucerin cream, 1:1   Topical TID PRN Pokhrel, Laxman, MD         No Known Allergies:  Family History  Problem Relation Age of Onset  . Melanoma Mother   . Heart attack Mother   . Hyperlipidemia Neg Hx   . Sudden death Neg Hx   . Hypertension Neg Hx   . Diabetes Neg Hx   :  Social History   Socioeconomic History  . Marital status: Married    Spouse name: Not on file  . Number of children: Not on file  . Years of education: Not on file  . Highest education level: Not on file  Occupational History  . Not on file  Tobacco Use  . Smoking status: Never Smoker  . Smokeless tobacco: Never Used  Vaping Use  . Vaping Use: Never used  Substance and Sexual Activity  . Alcohol use: Yes    Comment: rare beer. 5 times a year at most. 2 beers when he drinks.  . Drug use: No  . Sexual activity: Yes    Partners: Female  Other Topics Concern  . 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   Welder/fabricator   2 caffeinated beverages daily   Social Determinants of Health   Financial Resource Strain: Not on file  Food Insecurity: Not on file  Transportation Needs: Not  on file  Physical  Activity: Not on file  Stress: Not on file  Social Connections: Not on file  Intimate Partner Violence: Not on file  :  Review of Systems: A comprehensive 14 point review of systems was negative except as noted in the HPI.  Exam: Patient Vitals for the past 24 hrs:  BP Temp Temp src Pulse Resp SpO2 Weight  05/29/20 0523 100/66 98.9 F (37.2 C) Oral 89 16 100 % --  05/29/20 0500 -- -- -- -- -- -- 67.6 kg  05/29/20 0209 -- -- -- 82 16 98 % --  05/28/20 2348 110/63 -- -- 77 -- -- --  05/28/20 2123 (!) 84/56 97.9 F (36.6 C) Oral 77 15 98 % --  05/28/20 2020 -- -- -- 78 18 98 % --  05/28/20 1528 -- -- -- 88 17 95 % --  05/28/20 1512 -- -- -- -- -- 95 % --  05/28/20 1408 104/65 98.5 F (36.9 C) Oral 73 15 95 % --  05/28/20 1112 -- -- -- 84 18 95 % --    General: Chronically ill-appearing male, no distress Eyes:  no scleral icterus.   ENT:  There were no oropharyngeal lesions.   Neck: Tracheostomy midline Lymphatics:  Negative cervical, supraclavicular or axillary adenopathy.   Respiratory: lungs were clear bilaterally without wheezing or crackles.   Cardiovascular:  Regular rate and rhythm, S1/S2, without murmur, rub or gallop.  There was no pedal edema.   GI:  abdomen was soft, flat, nontender, nondistended, without organomegaly.  PEG tube in place without erythema or drainage. Musculoskeletal: Strength symmetrical in the upper and lower extremities Skin exam was without echymosis, petichae.   Neuro exam was nonfocal. Patient was alert and oriented.  Attention was good.   Language was appropriate.  Mood was normal without depression.  Speech was not pressured.  Thought content was not tangential.     Lab Results  Component Value Date   WBC 9.6 05/27/2020   HGB 9.0 (L) 05/27/2020   HCT 29.3 (L) 05/27/2020   PLT 196 05/27/2020   GLUCOSE 161 (H) 05/27/2020   CHOL 109 02/19/2019   TRIG 71 07/29/2019   HDL 26.50 (L) 02/19/2019   LDLDIRECT 131.5 09/12/2011   LDLCALC 63  02/19/2019   ALT 19 05/27/2020   AST 17 05/27/2020   NA 136 05/27/2020   K 3.8 05/27/2020   CL 97 (L) 05/27/2020   CREATININE 0.62 05/27/2020   BUN 17 05/27/2020   CO2 32 05/27/2020    CT Angio Chest PE W/Cm &/Or Wo Cm  Result Date: 05/25/2020 CLINICAL DATA:  PE suspected, low/intermediate prob, positive D-dimer Shortness of breath with decreased oxygen saturation. EXAM: CT ANGIOGRAPHY CHEST WITH CONTRAST TECHNIQUE: Multidetector CT imaging of the chest was performed using the standard protocol during bolus administration of intravenous contrast. Multiplanar CT image reconstructions and MIPs were obtained to evaluate the vascular anatomy. CONTRAST:  33m OMNIPAQUE IOHEXOL 350 MG/ML SOLN COMPARISON:  Radiograph earlier today. Chest CT 03/24/2020. PET CT 06/14/2019 FINDINGS: Cardiovascular: There are no filling defects within the pulmonary arteries to suggest pulmonary embolus. Subsegmental branches are not well assessed due to breathing motion artifact. Dilatation of the main pulmonary artery at 3.5 cm. Mild aortic atherosclerosis. No acute aortic findings. Left chest port with tip at the atrial caval junction. Mild cardiomegaly. No pericardial effusion. Mediastinum/Nodes: Extensive mediastinal adenopathy that has progressed from prior exam. Representative node or nodal conglomerate in the right lower paratracheal station measures 3.9 x  4.0 cm, previously 3.2 x 3.6 cm. High right mediastinal/low supraclavicular node measures 2.3 cm short axis, previously 1.7 cm by my retrospective measurement. Progressive soft tissue density presumed nodal tissue in the right anterior mediastinum anterior to the aorta and IVC. Enlarged right internal mammary node adjacent to the lower sternum measures 2.1 cm. No axillary adenopathy. Tracheostomy with tip above the carina. Minimal retained mucus in the trachea. No esophageal wall thickening. No visualized thyroid nodule. Lungs/Pleura: Extensive chronic lung disease with  multifocal bronchiectasis, peripheral honeycombing, and diffuse ground-glass opacities throughout the lung parenchyma. Calcified granuloma in the left lower lobe. No evidence of acute airspace disease. No significant pleural effusion. Previous pigtail catheter in the right anterior chest has been removed. No pneumothorax. Upper Abdomen: Small retrosternal lymph node measures 6 mm, series 4, image 99. Limited imaging of the upper abdomen without other acute finding. Musculoskeletal: No acute osseous abnormality or focal bone lesion. Thoracic spine spondylosis. Review of the MIP images confirms the above findings. IMPRESSION: 1. No pulmonary embolus. Dilatation of the main pulmonary artery consistent with pulmonary arterial hypertension. 2. Progressive thoracic adenopathy. This is greater than typically seen with reactive adenopathy, and suspicious for Hodgkin's lymphoma recurrence. 3. Extensive interstitial lung disease with pulmonary fibrosis and bronchiectasis. No acute pulmonary findings. Aortic Atherosclerosis (ICD10-I70.0). Electronically Signed   By: Keith Rake M.D.   On: 05/25/2020 20:44   DG Chest Port 1 View  Result Date: 05/25/2020 CLINICAL DATA:  Shortness of breath. EXAM: PORTABLE CHEST 1 VIEW COMPARISON:  Most recent radiograph 04/07/2020 FINDINGS: Tracheostomy tube tip at the thoracic inlet. Left chest port remains in place. Chronic elevation of right hemidiaphragm. Interstitial lung disease with peripheral predominant reticulation, chronic and unchanged. No new airspace disease. Stable cardiomegaly and mediastinal contours. No pneumothorax. IMPRESSION: 1. Stable chronic interstitial lung disease. No new abnormalities. 2. Stable cardiomegaly. 3. Tracheostomy tube unchanged with tip at the thoracic inlet. Electronically Signed   By: Keith Rake M.D.   On: 05/25/2020 18:56     CT Angio Chest PE W/Cm &/Or Wo Cm  Result Date: 05/25/2020 CLINICAL DATA:  PE suspected, low/intermediate  prob, positive D-dimer Shortness of breath with decreased oxygen saturation. EXAM: CT ANGIOGRAPHY CHEST WITH CONTRAST TECHNIQUE: Multidetector CT imaging of the chest was performed using the standard protocol during bolus administration of intravenous contrast. Multiplanar CT image reconstructions and MIPs were obtained to evaluate the vascular anatomy. CONTRAST:  83m OMNIPAQUE IOHEXOL 350 MG/ML SOLN COMPARISON:  Radiograph earlier today. Chest CT 03/24/2020. PET CT 06/14/2019 FINDINGS: Cardiovascular: There are no filling defects within the pulmonary arteries to suggest pulmonary embolus. Subsegmental branches are not well assessed due to breathing motion artifact. Dilatation of the main pulmonary artery at 3.5 cm. Mild aortic atherosclerosis. No acute aortic findings. Left chest port with tip at the atrial caval junction. Mild cardiomegaly. No pericardial effusion. Mediastinum/Nodes: Extensive mediastinal adenopathy that has progressed from prior exam. Representative node or nodal conglomerate in the right lower paratracheal station measures 3.9 x 4.0 cm, previously 3.2 x 3.6 cm. High right mediastinal/low supraclavicular node measures 2.3 cm short axis, previously 1.7 cm by my retrospective measurement. Progressive soft tissue density presumed nodal tissue in the right anterior mediastinum anterior to the aorta and IVC. Enlarged right internal mammary node adjacent to the lower sternum measures 2.1 cm. No axillary adenopathy. Tracheostomy with tip above the carina. Minimal retained mucus in the trachea. No esophageal wall thickening. No visualized thyroid nodule. Lungs/Pleura: Extensive chronic lung disease with multifocal bronchiectasis, peripheral  honeycombing, and diffuse ground-glass opacities throughout the lung parenchyma. Calcified granuloma in the left lower lobe. No evidence of acute airspace disease. No significant pleural effusion. Previous pigtail catheter in the right anterior chest has been  removed. No pneumothorax. Upper Abdomen: Small retrosternal lymph node measures 6 mm, series 4, image 99. Limited imaging of the upper abdomen without other acute finding. Musculoskeletal: No acute osseous abnormality or focal bone lesion. Thoracic spine spondylosis. Review of the MIP images confirms the above findings. IMPRESSION: 1. No pulmonary embolus. Dilatation of the main pulmonary artery consistent with pulmonary arterial hypertension. 2. Progressive thoracic adenopathy. This is greater than typically seen with reactive adenopathy, and suspicious for Hodgkin's lymphoma recurrence. 3. Extensive interstitial lung disease with pulmonary fibrosis and bronchiectasis. No acute pulmonary findings. Aortic Atherosclerosis (ICD10-I70.0). Electronically Signed   By: Keith Rake M.D.   On: 05/25/2020 20:44   DG Chest Port 1 View  Result Date: 05/25/2020 CLINICAL DATA:  Shortness of breath. EXAM: PORTABLE CHEST 1 VIEW COMPARISON:  Most recent radiograph 04/07/2020 FINDINGS: Tracheostomy tube tip at the thoracic inlet. Left chest port remains in place. Chronic elevation of right hemidiaphragm. Interstitial lung disease with peripheral predominant reticulation, chronic and unchanged. No new airspace disease. Stable cardiomegaly and mediastinal contours. No pneumothorax. IMPRESSION: 1. Stable chronic interstitial lung disease. No new abnormalities. 2. Stable cardiomegaly. 3. Tracheostomy tube unchanged with tip at the thoracic inlet. Electronically Signed   By: Keith Rake M.D.   On: 05/25/2020 18:56   Assessment and Plan:  This is a 65 year old male with 1.  History of stage IIIa classic Hodgkin's lymphoma, nodular sclerosis subtype 2.  Acute on chronic respiratory failure with hypoxia, improved 3.  Normocytic anemia 4.  Type 2 diabetes mellitus 5.  Dysphagia  -The patient received 2 cycles of chemotherapy for his Hodgkin's lymphoma.  He had an excellent response on PET scan following 2 cycles.  He  did not receive any additional chemotherapy due to recurrent hospitalization and acute illness.  He will now establish care with Dr. Marin Olp since his primary oncologist has left our practice.  CTA chest concerning for possible Hodgkin's lymphoma recurrence.  Further work-up planned as an outpatient. -His respiratory status has improved.  Tracheostomy in place.  Pulmonary is following. -The patient has stable normocytic anemia.  We will continue to monitor as an outpatient. -Feeding tube in place.  We can have him follow-up with our dietitian as an outpatient management of tube feeds.  Thank you for this referral.   Mikey Bussing, DNP, AGPCNP-BC, AOCNP  ADDENDUM: I saw and examined Steve [name] this morning.  He was follow by Dr. Maylon Peppers previously.  He did not complete his therapy for the Hodgkin's disease.  He had at least stage III disease.  I believe that he probably had does have recurrence by the CT scan report.  We are going to have to get a PET scan on him.  Unfortunately we cannot do the PET scan as an inpatient.  He actually looks better than I would have thought.  I know that he has had a incredibly hard time because of his incredibly poor pulmonary status.  I think that if we do feel that he has recurrent Hodgkin's, I probably would recommend Adcetris.  I think this would be very reasonable and would work.  I would think that his disease is just in the thoracic area.  However, a PET scan can help [place] out.  I am not sure when he will be  discharged from the hospital.  I do want to try to help him out.  He does seem quite nice.  Lattie Haw, MD  Oswaldo Milian 12:2

## 2020-05-29 NOTE — Evaluation (Signed)
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  Clinical Impression  Patient presents with limited mobility due to weakness, decreased balance, decreased activity tolerance with SpO2 in 70's during mobility while on 60% trach collar.  He reports plans for home today via ambulance and has all equipment except 3:1.  States HHPT set up.  Plan to continue PT in [facility] setting.  No further skilled acute PT as planned d/c today.    Follow Up Recommendations Home health PT;Supervision for mobility/OOB    Equipment Recommendations  3in1 (PT)    Recommendations for Other Services       Precautions / Restrictions Precautions Precautions: Fall Precaution Comments: trach and trach collar @ 60% (watch sats)      Mobility  Bed Mobility Overal bed mobility: Needs Assistance Bed Mobility: Supine to Sit;Sit to Supine     Supine to sit: Min assist;HOB elevated Sit to supine: Min assist   General bed mobility comments: using rails and increased time to come up to sit, assist for balance forward, to supine assist for leg/positioning    Transfers Overall transfer level: Needs assistance Equipment used: Rolling walker (2 wheeled) Transfers: Sit to/from Stand           General transfer comment: mod A to stand to RW from EOB, performed x 3; SpO2 drops to 78% on 60% TC.  Recovery within 1 minute to about 88%  Ambulation/Gait             General Gait Details: NT  Stairs             Wheelchair Mobility    Modified Rankin (Stroke Patients Only)       Balance Overall balance assessment: Needs assistance   Sitting balance-Leahy Scale: Fair Sitting balance - Comments: eventually able to sit without UE support   Standing balance support: Bilateral upper extremity supported Standing balance-Leahy Scale: Poor Standing balance comment: needs heavy UE support to stand                             Pertinent Vitals/Pain Pain Assessment: No/denies pain    Home Living Family/patient expects to be discharged to:: Private residence Living Arrangements: Spouse/significant other Available Help at Discharge: Family;Personal care attendant;Available 24 hours/day Type of Home: Mobile home Home Access: Ramped entrance     Home Layout: One level Home Equipment: Hospital bed;Wheelchair - [employer] - 2 wheels Additional Comments: home vent and hospital bed    Prior Function                 Hand Dominance   Dominant Hand: Right    Extremity/Trunk Assessment   Upper Extremity Assessment Upper Extremity Assessment: Generalized weakness    Lower Extremity Assessment Lower Extremity Assessment: Generalized weakness    Cervical / Trunk Assessment Cervical / Trunk Assessment: Kyphotic  Communication   Communication: Tracheostomy;Expressive difficulties  Cognition Arousal/Alertness: Awake/alert Behavior During Therapy: WFL for tasks assessed/performed Overall Cognitive Status: Difficult to assess  General Comments General comments (skin integrity, edema, etc.): patient requested suctioning prior to activity and RN came to perform.    Exercises     Assessment/Plan    PT Assessment All further PT needs can be met in the next venue of care  PT Problem List Decreased strength;Decreased mobility;Decreased activity tolerance;Decreased balance;Decreased knowledge of use of  DME;Cardiopulmonary status limiting activity       PT Treatment Interventions DME instruction;Therapeutic activities;Therapeutic exercise;Patient/family education;Gait training    PT Goals (Current goals can be found in the Care Plan section)  Acute Rehab PT Goals PT Goal Formulation: All assessment and education complete, DC therapy    Frequency Min 3X/week   Barriers to discharge        Co-evaluation               AM-PAC PT "6 Clicks" Mobility  Outcome Measure Help needed turning from your back to your side while in a flat bed without using bedrails?: A Lot Help needed moving from lying on your back to sitting on the side of a flat bed without using bedrails?: A Lot Help needed moving to and from a bed to a chair (including a wheelchair)?: A Lot Help needed standing up from a chair using your arms (e.g., wheelchair or bedside chair)?: A Lot Help needed to walk in hospital room?: Total Help needed climbing 3-5 steps with a railing? : Total 6 Click Score: 10    End of Session   Activity Tolerance: Patient tolerated treatment well Patient left: in bed;with call bell/phone within reach;with bed alarm set Nurse Communication: Mobility status PT Visit Diagnosis: Muscle weakness (generalized) (M62.81)    Time: 8628-2417 PT Time Calculation (min) (ACUTE ONLY): 24 min   Charges:   PT Evaluation $PT Eval Moderate Complexity: 1 Mod PT Treatments $Therapeutic Activity: 8-22 mins        Magda Kiel, PT Acute Rehabilitation Services Pager:[phone] Office:[phone] 05/29/2020   Reginia Naas 05/29/2020, 3:37 PM

## 2020-05-30 ENCOUNTER — Other Ambulatory Visit: Payer: Self-pay | Admitting: Hematology & Oncology

## 2020-05-30 DIAGNOSIS — C8104 Nodular lymphocyte predominant Hodgkin lymphoma, lymph nodes of axilla and upper limb: Secondary | ICD-10-CM

## 2020-05-30 LAB — CULTURE, BLOOD (ROUTINE X 2)
Culture: NO GROWTH
Culture: NO GROWTH
Special Requests: ADEQUATE
Special Requests: ADEQUATE

## 2020-06-01 ENCOUNTER — Inpatient Hospital Stay: Payer: BC Managed Care – PPO

## 2020-06-01 ENCOUNTER — Telehealth: Payer: Self-pay

## 2020-06-01 ENCOUNTER — Inpatient Hospital Stay: Payer: BC Managed Care – PPO | Admitting: Hematology & Oncology

## 2020-06-01 NOTE — Telephone Encounter (Signed)
Called and left message for Steve [name], patient does not need to come in for appt today 06/01/20 per MD. Informed her [name] is out of office today but will reach out when she returns for appt information and transportation.

## 2020-06-03 ENCOUNTER — Encounter: Payer: Self-pay | Admitting: *Deleted

## 2020-06-03 ENCOUNTER — Ambulatory Visit (HOSPITAL_COMMUNITY): Payer: BC Managed Care – PPO

## 2020-06-03 ENCOUNTER — Inpatient Hospital Stay (HOSPITAL_COMMUNITY): Admission: RE | Admit: 2020-06-03 | Payer: BC Managed Care – PPO | Source: Ambulatory Visit

## 2020-06-03 ENCOUNTER — Telehealth (INDEPENDENT_AMBULATORY_CARE_PROVIDER_SITE_OTHER): Payer: BC Managed Care – PPO | Admitting: Emergency Medicine

## 2020-06-03 ENCOUNTER — Encounter: Payer: Self-pay | Admitting: Emergency Medicine

## 2020-06-03 DIAGNOSIS — J9621 Acute and chronic respiratory failure with hypoxia: Secondary | ICD-10-CM

## 2020-06-03 DIAGNOSIS — C8100 Nodular lymphocyte predominant Hodgkin lymphoma, unspecified site: Secondary | ICD-10-CM | POA: Diagnosis not present

## 2020-06-03 NOTE — Assessment & Plan Note (Signed)
Chronic respiratory failure, now off mechanical ventilation and on trach collar 0.40, cuffless trach.  He is going to have his trach changes in our trach clinic by [name].  Given his secretion burden, frequent suctioning, oxygen needs I do not think he is a candidate for decannulation at this time.  That is still one of our goals in the future.  Question whether the chest vest percussion will help with secretion management and help facilitate this.  He has severe bronchiectasis, nebs on a schedule.  Plan continue same.  To follow-up in trach clinic next week

## 2020-06-03 NOTE — Progress Notes (Signed)
Patient has been discharged from the hospital and needs a PET scan for restaging. We will bring him into the office for follow up after the PET.  Spoke to [place], the patient's wife. She is his primary caregiver and is also struggling to trying to work, and care for their two smaller children. She is usually off on Fridays, so she asks that any appointments be made for Friday afternoon. She also mentions a need for FMLA.  PET scan is scheduled for 06/19/2020 in the afternoon. Dr Marin Olp is on vacation the following Friday, so the follow up appointment in this office is delayed to 07/03/2020.   Reviewed all appointments with [name]. Instructed her on PET prep including NPO for 6hrs before scan and no insulin within those 6 hours. Calendar with appointments and written instructions will also be sent to the patient home for education reinforcement. I will also send my business card again. She knows to call the office with any questions or concerns.  She was also notified that we could complete FMLA papers for her.   Oncology Nurse Navigator Documentation  Oncology Nurse Navigator Flowsheets 06/03/2020  Abnormal Finding Date -  Confirmed Diagnosis Date -  Diagnosis Status -  Planned Course of Treatment -  Phase of Treatment -  Chemotherapy Pending- Reason: -  Chemotherapy Actual Start Date: -  Navigator Follow Up Date: 06/19/2020  Navigator Follow Up Reason: Scan Review  Navigator Location [facility]  Referral Date to RadOnc/MedOnc -  Navigator Encounter Type Telephone;Appt/Treatment Plan Review  Telephone Outgoing Call;Appt Confirmation/Clarification  Treatment Initiated Date -  Patient Visit Type MedOnc  Treatment Phase Post-Tx Follow-up  Barriers/Navigation Needs Coordination of Care;Education;Employed;Family Concerns;Morbidities/Frailty;Multiple Hospital Admissions  Education Other  Interventions Coordination of Care;Education;Psycho-Social Support  Acuity Level 4-High Needs  (Greater Than 4 Barriers Identified)  Referrals -  Coordination of Care Appts;Radiology  Education Method Verbal;Written;Teach-back  Support Groups/Services Friends and Family  Time Spent with Patient 64

## 2020-06-03 NOTE — Addendum Note (Signed)
Addended by: Gavin Potters R on: 06/03/2020 11:46 AM   Modules accepted: Orders

## 2020-06-03 NOTE — Progress Notes (Signed)
Virtual Visit via Video Note  I connected with Steve [name] on 06/03/20 at 10:30 AM EST by a video enabled telemedicine application and verified that I am speaking with the correct person using two identifiers.  Location: Patient: Home Provider: Office   I discussed the limitations of evaluation and management by telemedicine and the availability of in person appointments. The patient expressed understanding and agreed to proceed.  History of Present Illness: Complicated history.  65 year old with Hodgkin's lymphoma (treated), diabetes, OSA, hypertension, GERD, pancreatitis.  He had COVID-19 pneumonia 1 year ago with progressive dyspnea, ARDS and hypoxemic respiratory failure.  Very complicated course with possible superimposed fungal pneumonia, bilateral pneumothoraces with chest tubes.  He underwent tracheostomy and PEG.  Was discharged 10/2019 to home on mechanical ventilation via the trach.  He has been bedbound since the hospitalization above, was on hospice for a period of time but needed to plan to transition off since he was going to maintain mechanical ventilation.  He was readmitted 12/13-1/7 with increased cough and purulent sputum, was found to have a right pneumothorax requiring chest tube placement.  He was weaned to trach collar prior to discharge.  Went home with cuffless trach and on trach collar 10 L/min.  Imaging consistent with residual ILD (presumed due to Covid) and bronchiectasis.  Admitted again on 05/25/2020 with respiratory distress, hypoxemia.  Felt to be due to mucous plugging.  Plan was to be discharged with plan for chest vest (doesn't have) for pulmonary hygiene given his known bronchiectasis.  Sputum culture was positive for Pseudomonas, treated with levofloxacin.  Continues to require tube feeding, is n.p.o.  Bronchodilator regimen currently albuterol nebs q6h. He is completing cipro course.    Observations/Objective: CT chest 05/25/20 reviewed by me,  He has  f/u with Dr Katheran Awe and PET scan scheduled coming up.    He is on 10L/min ATC, effectively 0.40. RT from [employer] is seeing him but not often. Suctioning 6-8x a day, thick tan. He is on mucinex. Wife believes that he is bouncing back from the hospitalization. Hoping to go to trach clinic for trach change. Currently has a cuffless Shiley #6. He is doing speaking valve for about 3-4 min at at time, then desats.   He still doesn't have the percussion vest - need to work w [employer] for this.   Assessment and Plan: Acute on chronic respiratory failure with hypoxia ([name]) Chronic respiratory failure, now off mechanical ventilation and on trach collar 0.40, cuffless trach.  He is going to have his trach changes in our trach clinic by [name].  Given his secretion burden, frequent suctioning, oxygen needs I do not think he is a candidate for decannulation at this time.  That is still one of our goals in the future.  Question whether the chest vest percussion will help with secretion management and help facilitate this.  He has severe bronchiectasis, nebs on a schedule.  Plan continue same.  To follow-up in trach clinic next week  Hodgkin's lymphoma [facility]) His CT chest that was performed when he was hospitalized recently showed profound bilateral interstitial disease, traction bronchiectasis as well as some enlargement of his mediastinal lymphadenopathy.  Significant this is unclear in the presence of infection.  He has follow-up planned with Dr. Marin Olp, is going to have a PET scan done to evaluate the nodes further.  I do believe that his acute and chronic infection will make interpretation of the nodes    Follow Up Instructions: 2-3 months video.  I discussed the assessment and treatment plan with the patient. The patient was provided an opportunity to ask questions and all were answered. The patient agreed with the plan and demonstrated an understanding of the instructions.   The patient was advised to  call back or seek an in-person evaluation if the symptoms worsen or if the condition fails to improve as anticipated.  I provided 35 minutes of non-face-to-face time during this encounter.   Baltazar Apo, MD, PhD 06/03/2020, 11:09 AM [facility] [phone] or if no answer before 7:00PM call [phone] For any issues after 7:00PM please call eLink [phone]

## 2020-06-03 NOTE — Assessment & Plan Note (Signed)
His CT chest that was performed when he was hospitalized recently showed profound bilateral interstitial disease, traction bronchiectasis as well as some enlargement of his mediastinal lymphadenopathy.  Significant this is unclear in the presence of infection.  He has follow-up planned with Dr. Marin Olp, is going to have a PET scan done to evaluate the nodes further.  I do believe that his acute and chronic infection will make interpretation of the nodes

## 2020-06-10 ENCOUNTER — Other Ambulatory Visit: Payer: Self-pay | Admitting: Medical

## 2020-06-11 ENCOUNTER — Telehealth: Payer: Self-pay | Admitting: Medical

## 2020-06-11 NOTE — Telephone Encounter (Signed)
Steve [name], [facility]  Call back @ [phone]   Patient states he is not sleeping well despite taking lorazepam and trazodone,Lisa with [facility] would ike to know If patient can be tried on another medication

## 2020-06-12 NOTE — Telephone Encounter (Signed)
Spoke with caregiver ands he states Percell Miller is Stan's PCP and they haven't seen Dr.Osei and she doesn't know the strength of the Ativan and cant look because she is away from the house , but she states its a low does but he takes it PRN

## 2020-06-12 NOTE — Telephone Encounter (Signed)
On our records dose of ativan is 0.5 mg. So can take 1 mg at night. Continue trazadone 100 mg q hs as well.

## 2020-06-12 NOTE — Telephone Encounter (Signed)
Called pt and lvm to return call.  

## 2020-06-12 NOTE — Telephone Encounter (Signed)
Will you check and see who is pt pcp. Saw in chart Dr. Orma Render pt pcp since Jun 02, 2020. Was not aware of that?  If pt has new pcp then they should send request to him.  If I am pcp clarify how much ativan he is using recently? He is on high dose trazadone 100 mg. Maybe increase ativan possible depending on current dose use.

## 2020-06-15 NOTE — Telephone Encounter (Signed)
Called pt and lvm to return call.  

## 2020-06-16 NOTE — Telephone Encounter (Signed)
Wife notified , and will update [place] at end of week or either next week

## 2020-06-17 ENCOUNTER — Ambulatory Visit (HOSPITAL_COMMUNITY)
Admission: RE | Admit: 2020-06-17 | Discharge: 2020-06-17 | Disposition: A | Discharge status: Home / Self Care | Payer: BC Managed Care – PPO | Source: Ambulatory Visit | Attending: Acute Care | Admitting: Acute Care

## 2020-06-17 ENCOUNTER — Other Ambulatory Visit: Payer: Self-pay

## 2020-06-17 DIAGNOSIS — Z430 Encounter for attention to tracheostomy: Secondary | ICD-10-CM | POA: Diagnosis not present

## 2020-06-17 DIAGNOSIS — J8410 Pulmonary fibrosis, unspecified: Secondary | ICD-10-CM | POA: Insufficient documentation

## 2020-06-17 DIAGNOSIS — J9611 Chronic respiratory failure with hypoxia: Secondary | ICD-10-CM | POA: Insufficient documentation

## 2020-06-17 DIAGNOSIS — Z930 Tracheostomy status: Secondary | ICD-10-CM | POA: Diagnosis not present

## 2020-06-17 NOTE — Progress Notes (Signed)
[facility]   Reason for visit:  Hospital follow up and trach change  HPI:  65 year old male whom I know well from prior hospitalizations. He has chronic trach dependence and chronic hypoxic respiratory failure 2/2 COVID related Pulmonary fibrosis and BTX. He also was recently hospitalized for Pseudomonas tracheobronchitis. Comes in today for planned hospital follow up and also planned trach change  ROS  Review of Systems - History obtained from the patient General ROS: negative for - fatigue, fever, night sweats or weight loss Psychological ROS: negative for - anxiety ENT ROS: negative for - headaches, nasal congestion, nasal discharge, sinus pain, sneezing or sore throat Hematological and Lymphatic ROS: negative Endocrine ROS: negative Respiratory ROS: no cough, shortness of breath, or wheezing Cardiovascular ROS: no chest pain or dyspnea on exertion Gastrointestinal ROS: no abdominal pain, change in bowel habits, or black or bloody stools Musculoskeletal ROS: negative Neurological ROS: no TIA or stroke symptoms  Vital signs:  Reviewed  Exam:  Physical Exam Constitutional:      General: He is not in acute distress.    Appearance: Normal appearance. He is not ill-appearing or toxic-appearing.  HENT:     Head: Normocephalic and atraumatic.     Nose: Nose normal. No congestion.  Eyes:     Pupils: Pupils are equal, round, and reactive to light.  Neck:     Comments: The trach site is unremarkable  Cardiovascular:     Rate and Rhythm: Normal rate and regular rhythm.     Pulses: Normal pulses.  Pulmonary:     Effort: Pulmonary effort is normal.     Breath sounds: Normal breath sounds. No rales.  Abdominal:     General: Abdomen is flat.  Musculoskeletal:        General: No swelling.     Cervical back: Neck supple.  Skin:    Capillary Refill: Capillary refill takes less than 2 seconds.  Neurological:     Mental Status: He is alert.     Trach  change/procedure: the size 6 cuffless trach was removed. This was done by his wife under by direction and instruction. I then talked her through the procedure for re-insertion. The patient tolerated this well. ETCO2 confirmed.       Impression/dx  Chronic hypoxic respiratory failure Bronchiectasis  COVID related pulmonary Fibrosis Dysphagia  Severe deconditioning Protein calorie malnutrition  Discussion  Cap is doing well at home under the care of his wife [name]. He still requires intermittent suction but sputum color has remained unchanged and clear. He still has baseline exertional dyspnea but maintains pulse ox above 90% on 6-7 LPM. From time to time he has to increase his oxygen up to 9 liters. Since he has been home he is eating again. I think that this is fine. He has passed a swallow eval at [facility] and then out of no-where it was decided he needed a cuffed trach and it should stay inflated indef. Which makes no sense to me and I changed him to cuffless during his last hospital stay. He's been fine since. He is still fairly frail and debilitated but I am hopeful now that he is home he can get some weight back on and make progress towards increasing his strength. As long as he still needs suctioning he will need to keep trach. He has plans for repeat CT imaging next week. This is for follow up on his lymphoma. If that were to come back I doubt there is  anything medically we could do for him that he could physically tolerate. I will defere this to [place]. From my stand-point I will continue to provide supportive care   Plan  Continue routine trach care Have instructed [name] to increase his oxygen to 9 liters prior to suction and also for anticipated increased exertion.  I have instructed [name] on the importance that she notifies [place] early for concern about possible upper airway infection I have also instructed her on how to change the trach (she and I will rotate doing this every 3  months so that I still see him every 6 months.    Visit time: 45  minutes.   Erick Colace ACNP-BC [place]

## 2020-06-17 NOTE — Progress Notes (Signed)
Tracheostomy Procedure Note  Steve [name] [id number] Aug 13, 1955  Pre Procedure Tracheostomy Information  Trach Brand: Shiley Size: 6.0 uncuffed flexed  #[id number] Style: Uncuffed Secured by: Velcro   Procedure: Trach cleaning, trach change and Trach education    Post Procedure Tracheostomy Information  Trach Brand: Shiley Size: 6.0 uncuffed flexed Style: Uncuffed Secured by: Velcro   Post Procedure Evaluation:  ETCO2 positive color change from yellow to purple : Yes.   Vital signs:VSS Patients current condition: stable Complications: No apparent complications Trach site exam: clean and dry Wound care done: 4 x 4 gauze drain gauze Patient did tolerate procedure well.   Education: Pt wife taught to change trach. Good understanding and good technique  Prescription needs: none    Additional needs: none

## 2020-06-19 ENCOUNTER — Other Ambulatory Visit: Payer: Self-pay

## 2020-06-19 ENCOUNTER — Telehealth: Payer: Self-pay | Admitting: Acute Care

## 2020-06-19 ENCOUNTER — Encounter (HOSPITAL_COMMUNITY)
Admission: RE | Admit: 2020-06-19 | Discharge: 2020-06-19 | Disposition: A | Discharge status: Home / Self Care | Payer: BC Managed Care – PPO | Source: Ambulatory Visit | Attending: Hematology & Oncology | Admitting: Hematology & Oncology

## 2020-06-19 DIAGNOSIS — C8104 Nodular lymphocyte predominant Hodgkin lymphoma, lymph nodes of axilla and upper limb: Secondary | ICD-10-CM | POA: Insufficient documentation

## 2020-06-19 LAB — GLUCOSE, CAPILLARY: Glucose-Capillary: 114 mg/dL — ABNORMAL HIGH (ref 70–99)

## 2020-06-19 MED ORDER — FLUDEOXYGLUCOSE F - 18 (FDG) INJECTION
8.0000 | Freq: Once | INTRAVENOUS | Status: AC
  Administered 2020-06-19: 8 via INTRAVENOUS

## 2020-06-22 ENCOUNTER — Telehealth: Payer: Self-pay

## 2020-06-22 ENCOUNTER — Encounter: Payer: Self-pay | Admitting: *Deleted

## 2020-06-22 DIAGNOSIS — J9621 Acute and chronic respiratory failure with hypoxia: Secondary | ICD-10-CM

## 2020-06-22 NOTE — Progress Notes (Signed)
PET reviewed with Dr Marin Olp. He will review with patient and wife during his follow up appointment scheduled 07/03/2020 and review treatment options. Reviewed results with patient's wife and plan for next appointment.   Oncology Nurse Navigator Documentation  Oncology Nurse Navigator Flowsheets 06/22/2020  Abnormal Finding Date -  Confirmed Diagnosis Date -  Diagnosis Status -  Planned Course of Treatment -  Phase of Treatment -  Chemotherapy Pending- Reason: -  Chemotherapy Actual Start Date: -  Navigator Follow Up Date: 07/03/2020  Navigator Follow Up Reason: Follow-up Appointment  Navigator Location [facility]  Referral Date to RadOnc/MedOnc -  Navigator Encounter Type Scan Review;Telephone  Telephone Diagnostic Results;Outgoing Call  Treatment Initiated Date -  Patient Visit Type MedOnc  Treatment Phase Post-Tx Follow-up  Barriers/Navigation Needs Coordination of Care;Education;Employed;Family Concerns;Morbidities/Frailty;Multiple Hospital Admissions  Education Other  Interventions Education;Psycho-Social Support  Acuity Level 4-High Needs (Greater Than 4 Barriers Identified)  Referrals -  Coordination of Care -  Education Method Verbal  Support Groups/Services Friends and Family  Time Spent with Patient 30

## 2020-06-22 NOTE — Telephone Encounter (Signed)
Order sent to DME.   Nothing further needed at this time.

## 2020-06-22 NOTE — Telephone Encounter (Signed)
-----   Message from Erick Colace, NP sent at 06/19/2020  9:57 AM EST ----- Regarding: DME order Oxygen  Ok to adjust oxygen up.   40% atc at rest. May titrate up to 70% with exertion.   Call if still requiring > 50% at rest.

## 2020-07-03 ENCOUNTER — Inpatient Hospital Stay: Payer: BC Managed Care – PPO | Attending: Hematology & Oncology

## 2020-07-03 ENCOUNTER — Encounter: Payer: Self-pay | Admitting: Hematology & Oncology

## 2020-07-03 ENCOUNTER — Inpatient Hospital Stay: Payer: BC Managed Care – PPO

## 2020-07-03 ENCOUNTER — Inpatient Hospital Stay: Payer: BC Managed Care – PPO | Admitting: Hematology & Oncology

## 2020-07-03 ENCOUNTER — Encounter: Payer: Self-pay | Admitting: *Deleted

## 2020-07-03 ENCOUNTER — Other Ambulatory Visit: Payer: Self-pay

## 2020-07-03 ENCOUNTER — Other Ambulatory Visit: Payer: Self-pay | Admitting: Family

## 2020-07-03 ENCOUNTER — Telehealth: Payer: Self-pay

## 2020-07-03 VITALS — BP 105/58 | HR 103 | Resp 18

## 2020-07-03 DIAGNOSIS — C8110 Nodular sclerosis classical Hodgkin lymphoma, unspecified site: Secondary | ICD-10-CM | POA: Diagnosis not present

## 2020-07-03 DIAGNOSIS — Z7901 Long term (current) use of anticoagulants: Secondary | ICD-10-CM | POA: Insufficient documentation

## 2020-07-03 DIAGNOSIS — C8102 Nodular lymphocyte predominant Hodgkin lymphoma, intrathoracic lymph nodes: Secondary | ICD-10-CM

## 2020-07-03 DIAGNOSIS — C8100 Nodular lymphocyte predominant Hodgkin lymphoma, unspecified site: Secondary | ICD-10-CM

## 2020-07-03 DIAGNOSIS — Z95828 Presence of other vascular implants and grafts: Secondary | ICD-10-CM

## 2020-07-03 DIAGNOSIS — Z79899 Other long term (current) drug therapy: Secondary | ICD-10-CM | POA: Diagnosis not present

## 2020-07-03 LAB — CBC WITH DIFFERENTIAL (CANCER CENTER ONLY)
Abs Immature Granulocytes: 0.08 10*3/uL — ABNORMAL HIGH (ref 0.00–0.07)
Basophils Absolute: 0.1 10*3/uL (ref 0.0–0.1)
Basophils Relative: 0 %
Eosinophils Absolute: 0.9 10*3/uL — ABNORMAL HIGH (ref 0.0–0.5)
Eosinophils Relative: 6 %
HCT: 30.3 % — ABNORMAL LOW (ref 39.0–52.0)
Hemoglobin: 9.3 g/dL — ABNORMAL LOW (ref 13.0–17.0)
Immature Granulocytes: 1 %
Lymphocytes Relative: 12 %
Lymphs Abs: 1.7 10*3/uL (ref 0.7–4.0)
MCH: 26.7 pg (ref 26.0–34.0)
MCHC: 30.7 g/dL (ref 30.0–36.0)
MCV: 87.1 fL (ref 80.0–100.0)
Monocytes Absolute: 1.3 10*3/uL — ABNORMAL HIGH (ref 0.1–1.0)
Monocytes Relative: 9 %
Neutro Abs: 10.1 10*3/uL — ABNORMAL HIGH (ref 1.7–7.7)
Neutrophils Relative %: 72 %
Platelet Count: 277 10*3/uL (ref 150–400)
RBC: 3.48 MIL/uL — ABNORMAL LOW (ref 4.22–5.81)
RDW: 15.5 % (ref 11.5–15.5)
WBC Count: 14 10*3/uL — ABNORMAL HIGH (ref 4.0–10.5)
nRBC: 0 % (ref 0.0–0.2)

## 2020-07-03 LAB — CMP (CANCER CENTER ONLY)
ALT: 23 U/L (ref 0–44)
AST: 20 U/L (ref 15–41)
Albumin: 3.1 g/dL — ABNORMAL LOW (ref 3.5–5.0)
Alkaline Phosphatase: 63 U/L (ref 38–126)
Anion gap: 4 — ABNORMAL LOW (ref 5–15)
BUN: 33 mg/dL — ABNORMAL HIGH (ref 8–23)
CO2: 42 mmol/L — ABNORMAL HIGH (ref 22–32)
Calcium: 10.2 mg/dL (ref 8.9–10.3)
Chloride: 91 mmol/L — ABNORMAL LOW (ref 98–111)
Creatinine: 0.58 mg/dL — ABNORMAL LOW (ref 0.61–1.24)
GFR, Estimated: >60 mL/min (ref >60)
Glucose, Bld: 128 mg/dL — ABNORMAL HIGH (ref 70–99)
Potassium: 4.4 mmol/L (ref 3.5–5.1)
Sodium: 137 mmol/L (ref 135–145)
Total Bilirubin: 0.5 mg/dL (ref 0.3–1.2)
Total Protein: 7.8 g/dL (ref 6.5–8.1)

## 2020-07-03 MED ORDER — SODIUM CHLORIDE 0.9% FLUSH
10.0000 mL | Freq: Once | INTRAVENOUS | Status: AC
  Administered 2020-07-03: 10 mL via INTRAVENOUS
  Filled 2020-07-03: qty 10

## 2020-07-03 MED ORDER — HEPARIN SOD (PORK) LOCK FLUSH 100 UNIT/ML IV SOLN
500.0000 [IU] | Freq: Once | INTRAVENOUS | Status: AC
  Administered 2020-07-03: 500 [IU] via INTRAVENOUS
  Filled 2020-07-03: qty 5

## 2020-07-03 NOTE — Telephone Encounter (Signed)
S/w pts wife and she is aware of his appts    Steve [name]

## 2020-07-03 NOTE — Progress Notes (Signed)
Patient is here for new patient appointment to establish care, however he isn't new to the office, as he is a previous patient of Dr Lorette Ang. Wife is present in the office with him.   He will plan to start a new treatment on 07/10/2020. Appointments all made. I followed up after patient left with whether or not [name] would like individual chemo education prior to start. She does request this support. She is scheduled for 07/09/20 over the phone as she is unable to get into the office. Due to patient medical issues, she also requests that she stay with patient during infusion. Will make notation on the chart and request bedroom.   Oncology Nurse Navigator Documentation  Oncology Nurse Navigator Flowsheets 07/03/2020  Abnormal Finding Date -  Confirmed Diagnosis Date -  Diagnosis Status -  Planned Course of Treatment -  Phase of Treatment -  Chemotherapy Pending- Reason: -  Chemotherapy Actual Start Date: -  Navigator Follow Up Date: 07/10/2020  Navigator Follow Up Reason: Chemotherapy  Navigator Location [facility]  Referral Date to RadOnc/MedOnc -  Navigator Encounter Type Initial MedOnc  Telephone -  Treatment Initiated Date -  Patient Visit Type MedOnc  Treatment Phase Post-Tx Follow-up  Barriers/Navigation Needs Coordination of Care;Education;Employed;Family Concerns;Morbidities/Frailty;Multiple Hospital Admissions  Education Other  Interventions Education;Psycho-Social Support  Acuity Level 4-High Needs (Greater Than 4 Barriers Identified)  Referrals -  Coordination of Care Appts  Education Method Verbal  Support Groups/Services Friends and Family  Time Spent with Patient 77

## 2020-07-03 NOTE — Progress Notes (Signed)
DISCONTINUE ON PATHWAY REGIMEN - Lymphoma and CLL     A cycle is every 28 days:     Doxorubicin      Vinblastine      Dacarbazine      Brentuximab vedotin   **Always confirm dose/schedule in your pharmacy ordering system**  REASON: Disease Progression PRIOR TREATMENT: [id number]: A + AVD (Brentuximab vedotin + Doxorubicin + Vinblastine + Dacarbazine) q28 Days x 6 Cycles TREATMENT RESPONSE: Partial Response (PR)  START ON PATHWAY REGIMEN - Lymphoma and CLL     A cycle is every 3 weeks:     Brentuximab vedotin   **Always confirm dose/schedule in your pharmacy ordering system**  Patient Characteristics: Classical Hodgkin Lymphoma, Second Line, Not a Transplant Candidate Disease Type: Not Applicable Disease Type: Not Applicable Disease Type: Classical Hodgkin Lymphoma Line of therapy: Second Line Patient Characteristics: Not a Transplant Candidate Intent of Therapy: Non-Curative / Palliative Intent, Discussed with Patient

## 2020-07-03 NOTE — Progress Notes (Signed)
Hematology and Oncology Follow Up Visit  Steve [name] [id number] 02/02/1956 65 y.o. 07/03/2020   Principle Diagnosis:   Relapsed classical Hodgkin's disease  Current Therapy:    Adcetris-start cycle 1 on 07/10/2020     Interim History:  Steve [name] is back to the office after a long absence.  He has been followed by Dr. Maylon Peppers.  He had a history of classical Hodgkin's lymphoma.  He unfortunately has horrible underlying lung disease.  This happened when he had COVID.  When he initially presented back in November 2020, he had bulky adenopathy.  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  He was treated with chemotherapy with AAVD.  He was not given bleomycin because of his lung disease.  He was thought to be at least stage III.  Unfortunately he only received 3 or 4 cycles of treatment and then had no further treatment.  I think he had health issues related to his lungs.  He subsequently was seen by myself in the hospital after he was admitted because of respiratory failure.  He has a tracheotomy in.  He developed Covid and then developed severe lung issues from Covid.  When he was admitted, he was noted to have adenopathy.  We were able to finally get a PET scan on him.  The PET scan was done on 06/19/2020.  This showed bulky substernal mediastinal adenopathy.  He had right supraclavicular adenopathy.  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  He comes in with his wife.  He comes in in a wheelchair.  His performance status is ECOG 2-3 at best.  He has a feeding tube in.  His wife is incredibly attentive to him.  She is doing a great job with try to maintain his nutrition.  His albumin is 3.1.  He has had no problems with bleeding.  He has not had any issues with pain.  Medications:  Current Outpatient Medications:  .  acetaminophen (TYLENOL) 160 MG/5ML solution, Place 20.3 mLs (650 mg total) into feeding tube every 4 (four) hours as needed for mild  pain or fever (temp > 101.5). (Patient not taking: Reported on 05/25/2020), Disp: 120 mL, Rfl: 0 .  albuterol (PROVENTIL) (2.5 MG/3ML) 0.083% nebulizer solution, Take 3 mLs (2.5 mg total) by nebulization every 2 (two) hours as needed for wheezing., Disp: 75 mL, Rfl: 12 .  Amino Acids-Protein Hydrolys (FEEDING SUPPLEMENT, PRO-STAT SUGAR FREE 64,) LIQD, Place 30 mLs into feeding tube daily., Disp: , Rfl:  .  chlorhexidine (PERIDEX) 0.12 % solution, 15 mLs by Mouth Rinse route 2 (two) times daily., Disp: 120 mL, Rfl: 0 .  Chlorhexidine Gluconate Cloth 2 % PADS, Apply 6 each topically daily. (Patient not taking: No sig reported), Disp: , Rfl:  .  Cholecalciferol (VITAMIN D3) 10 MCG/ML LIQD, Give 400 Units by tube daily. (Patient taking differently: Place 400 Units into feeding tube daily.), Disp: 120 mL, Rfl:  .  enoxaparin (LOVENOX) 40 MG/0.4ML injection, Inject 0.4 mLs (40 mg total) into the skin daily., Disp: 0 mL, Rfl:  .  furosemide (LASIX) 20 MG tablet, Place 20 mg into feeding tube daily., Disp: , Rfl:  .  HYOSYNE 0.125 MG/ML solution, TAKE 1 ML VIA G-TUBE EVERY FOUR HOURS AS NEEDED, Disp: , Rfl:  .  LORazepam (ATIVAN) 0.5 MG tablet, Place 1 tablet (0.5 mg total) into feeding tube every 4 (four) hours as needed for anxiety., Disp: 5 tablet, Rfl: 0 .  Multiple Vitamin (MULTIVITAMIN) LIQD,  Place 15 mLs into feeding tube daily. (Patient taking differently: No sig reported), Disp:  , Rfl:  .  Nutritional Supplements (FEEDING SUPPLEMENT, OSMOLITE 1.2 CAL,) LIQD, Place 1,000 mLs into feeding tube continuous., Disp: , Rfl: 0 .  Nutritional Supplements (FEEDING SUPPLEMENT, PROSOURCE TF,) liquid, Place 45 mLs into feeding tube 3 (three) times daily. (Patient not taking: No sig reported), Disp: , Rfl:  .  sertraline (ZOLOFT) 50 MG tablet, GIVE 1 TAB PER TUBE DAILY, Disp: 30 tablet, Rfl: 2 .  traZODone (DESYREL) 100 MG tablet, Place 100 mg into feeding tube at bedtime., Disp: , Rfl:  .  triamcinolone (KENALOG)  0.1 %, Apply 1 application topically 2 (two) times daily as needed (itching)., Disp: , Rfl:  .  Water For Irrigation, Sterile (FREE WATER) SOLN, Place 100 mLs into feeding tube every 4 (four) hours., Disp: , Rfl:   Allergies: No Known Allergies  Past Medical History, Surgical history, Social history, and Family History were reviewed and updated.  Review of Systems: Review of Systems  Constitutional: Positive for diaphoresis and fatigue.  HENT:  Negative.   Eyes: Negative.   Respiratory: Positive for chest tightness, shortness of breath and wheezing.   Cardiovascular: Positive for leg swelling.  Gastrointestinal: Positive for nausea.  Endocrine: Negative.   Genitourinary: Negative.    Musculoskeletal: Negative.   Skin: Negative.   Neurological: Negative.   Hematological: Negative.   Psychiatric/Behavioral: Negative.     Physical Exam:  blood pressure is 105/58 (abnormal) and his pulse is 103 (abnormal). His respiration is 18 and oxygen saturation is 100%.   Wt Readings from Last 3 Encounters:  05/29/20 148 lb 15.8 oz (67.6 kg)  04/17/20 135 lb 2.3 oz (61.3 kg)  12/06/19 130 lb (59 kg)    Physical Exam Vitals reviewed.  HENT:     Head: Normocephalic and atraumatic.  Eyes:     Pupils: Pupils are equal, round, and reactive to light.  Neck:     Comments: His neck exam shows no adenopathy bilaterally.  He does have the tracheotomy intact. Cardiovascular:     Rate and Rhythm: Normal rate and regular rhythm.     Heart sounds: Normal heart sounds.     Comments: Cardiac exam shows a regular rate and rhythm.  He has a normal S1 and S2.  There are no obvious murmurs. Pulmonary:     Effort: Pulmonary effort is normal.     Breath sounds: Normal breath sounds.     Comments: His lungs show congestion bilaterally.  He has some wheezes bilaterally.  He has decreased air movement bilaterally. Abdominal:     General: Bowel sounds are normal.     Palpations: Abdomen is soft.   Musculoskeletal:        General: No tenderness or deformity. Normal range of motion.     Cervical back: Normal range of motion.     Comments: Extremities does show some mild pitting edema in his legs bilaterally.  Lymphadenopathy:     Cervical: No cervical adenopathy.  Skin:    General: Skin is warm and dry.     Findings: No erythema or rash.  Neurological:     Mental Status: He is alert and oriented to person, place, and time.  Psychiatric:        Behavior: Behavior normal.        Thought Content: Thought content normal.        Judgment: Judgment normal.      Lab Results  Component Value  Date   WBC 14.0 (H) 07/03/2020   HGB 9.3 (L) 07/03/2020   HCT 30.3 (L) 07/03/2020   MCV 87.1 07/03/2020   PLT 277 07/03/2020     Chemistry      Component Value Date/Time   NA 137 07/03/2020 1050   K 4.4 07/03/2020 1050   CL 91 (L) 07/03/2020 1050   CO2 42 (H) 07/03/2020 1050   BUN 33 (H) 07/03/2020 1050   CREATININE 0.58 (L) 07/03/2020 1050      Component Value Date/Time   CALCIUM 10.2 07/03/2020 1050   ALKPHOS 63 07/03/2020 1050   AST 20 07/03/2020 1050   ALT 23 07/03/2020 1050   BILITOT 0.5 07/03/2020 1050      Impression and Plan: Mr. Dilauro is a very nice 65 year old white male.  He has relapsed Hodgkin's disease.  He did not receive his full complement of chemotherapy initially.  This was because of medical issues not related to his Hodgkin's disease.  Clearly, he has aggressive disease.  He is not a candidate for any aggressive intervention.  He is not a candidate for stem cell transplant.  I think the best that we can do is probably try to treat him with Adcetris.  I know that immunotherapy has been quite helpful for Hodgkin's disease.  However, I do worry about the possibility of interstitial pneumonitis from immunotherapy.  As such, I think we should try single agent Adcetris initially.  I would give him 3 cycles and if he is not had a good response, then we might be  able to add immunotherapy and take the risk of interstitial pneumonitis.  I spoke to he and his wife about all this.  I spent about 45 minutes with him.  I went over the PET scan.  I explained why we had to try to treat.  Again we can try nonchemotherapy treatment given his performance status.  He is in agreement.  His wife is in agreement.  We will try to get started next week.  Friday is the only time that he can come in.  His wife asked to bring him.  He was not weighed today.  Somehow we have to try to find his recent weight.  We will plan to get him back when he starts his second cycle of treatment.   Volanda Napoleon, MD 3/25/202212:31 PM

## 2020-07-03 NOTE — Patient Instructions (Signed)
Tunneled Central Venous Catheter Flushing Guide  It is important to flush your tunneled central venous catheter each time you use it, both before and after you use it. Flushing your catheter will help prevent it from clogging. What are the risks? Risks may include:  Infection.  Air getting into the catheter and bloodstream. Supplies needed:  A clean pair of gloves.  A disinfecting wipe. Use an alcohol wipe, chlorhexidine wipe, or iodine wipe as told by your health care provider.  A 10 mL syringe that has been prefilled with saline solution.  An empty 10 mL syringe, if a substance called heparin was injected into your catheter. How to flush your catheter When you flush your catheter, make sure you follow any specific instructions from your health care provider or the manufacturer. These are general guidelines. Flushing your catheter before use If there is heparin in your catheter: 1. Wash your hands with soap and water. 2. Put on gloves. 3. Scrub the injection cap for a minimum of 15 seconds with a disinfecting wipe. 4. Unclamp the catheter. 5. Attach the empty syringe to the injection cap. 6. Pull the syringe plunger back and withdraw 10 mL of blood. 7. Place the syringe into an appropriate waste container. 8. Scrub the injection cap for 15 seconds with a disinfecting wipe. 9. Attach the prefilled syringe to the injection cap. 10. Flush the catheter by pushing the plunger forward until all the liquid from the syringe is in the catheter. 11. Remove the syringe from the injection cap. 12. Clamp the catheter. If there is no heparin in your catheter: 1. Wash your hands with soap and water. 2. Put on gloves. 3. Scrub the injection cap for 15 seconds with a disinfecting wipe. 4. Unclamp the catheter. 5. Attach the prefilled syringe to the injection cap. 6. Flush the catheter by pushing the plunger forward until 5 mL of the liquid from the syringe is in the catheter. 7. Pull back on  the syringe until you see blood in the catheter. 8. If you have been asked to collect any blood, follow your health care provider's instructions. Otherwise, flush the catheter with the rest of the solution from the syringe. 9. Remove the syringe from the injection cap. 10. Clamp the catheter.   Flushing your catheter after use 1. Wash your hands with soap and water. 2. Put on gloves. 3. Scrub the injection cap for 15 seconds with a disinfecting wipe. 4. Unclamp the catheter. 5. Attach the prefilled syringe to the injection cap. 6. Flush the catheter by pushing the plunger forward until all of the liquid from the syringe is in the catheter. 7. Remove the syringe from the injection cap. 8. Clamp the catheter. Problems and solutions  If blood cannot be completely cleared from the injection cap, you may need to have the injection cap replaced.  If the catheter is difficult to flush, use the pulsing method. The pulsing method involves pushing only a few milliliters of solution into the catheter at a time and pausing between pushes.  If you do not see blood in the catheter when you pull back on the syringe, change your body position, such as by raising your arms above your head. Take a deep breath and cough. Then, pull back on the syringe. If you still do not see blood, flush the catheter with a small amount of solution. Then, change positions again and take a breath or cough. Pull back on the syringe again. If you still do not   see blood, finish flushing the catheter and contact your health care provider. Do not use your catheter until your health care provider says it is okay. General tips  Have someone help you flush your catheter, if possible.  Do not force fluid through your catheter.  Do not use a syringe that is larger or smaller than 10 mL. Using a smaller syringe can make the catheter burst.  Do not use your catheter without flushing it first if it has heparin in it. Contact a health  care provider if:  You cannot see any blood in the catheter when you flush it before using it.  Your catheter is difficult to flush. Get help right away if:  You cannot flush the catheter.  The catheter leaks when you flush it or when there is fluid in it.  There are cracks or breaks in the catheter. Summary  It is important to flush your tunneled central venous catheter each time you use it, both before and after you use it.  Scrub the injection cap for 15 seconds with a disinfecting wipe before and after you flush it.  When you flush your catheter, make sure you follow any specific instructions from your health care provider or the manufacturer.  Get help right away if you cannot flush the catheter. This information is not intended to replace advice given to you by your health care provider. Make sure you discuss any questions you have with your health care provider. Document Revised: 06/06/2019 Document Reviewed: 06/13/2018 Elsevier Patient Education  2021 [employer] Inc.  

## 2020-07-07 ENCOUNTER — Other Ambulatory Visit: Payer: Self-pay | Admitting: Medical

## 2020-07-08 ENCOUNTER — Telehealth: Payer: Self-pay | Admitting: Medical

## 2020-07-08 NOTE — Telephone Encounter (Signed)
Wife's would like a medication list to pharmacy   [id number] - [place], [place] Phone:  [phone]  Fax:  [phone]

## 2020-07-09 ENCOUNTER — Other Ambulatory Visit: Payer: Self-pay | Admitting: *Deleted

## 2020-07-09 ENCOUNTER — Other Ambulatory Visit: Payer: BC Managed Care – PPO

## 2020-07-09 ENCOUNTER — Encounter: Payer: Self-pay | Admitting: *Deleted

## 2020-07-09 DIAGNOSIS — C8112 Nodular sclerosis classical Hodgkin lymphoma, intrathoracic lymph nodes: Secondary | ICD-10-CM

## 2020-07-09 MED ORDER — LORAZEPAM 0.5 MG PO TABS: 0.5000 mg | ORAL_TABLET | Freq: Four times a day (QID) | ORAL | 0 refills | Status: DC | PRN

## 2020-07-09 MED ORDER — LIDOCAINE-PRILOCAINE 2.5-2.5 % EX CREA: TOPICAL_CREAM | CUTANEOUS | 3 refills | Status: AC

## 2020-07-09 NOTE — Telephone Encounter (Signed)
Spoke with kelly at pharmacy -- stated she can send feb until now to our office since some medications are on hold.  [name] wants edward to take over medications

## 2020-07-09 NOTE — Progress Notes (Signed)
Phone chemotherapy education class Took place with wife [name] to Discuss side effects of  Adcetris.  Patient has had chemotherapy before so we reviewed these side effects briefly which include but are not limited to myelosuppression, decreased appetite, fatigue, fever, allergic or infusional reaction, mucositis, cardiac toxicity, cough, SOB, altered taste, nausea and vomiting, diarrhea, constipation, elevated LFTs myalgia and arthralgias, hair loss or thinning, rash, skin dryness, nail changes, peripheral neuropathy, discolored urine, delayed wound healing, mental changes (Chemo brain), increased risk of infections, weight loss.  Reviewed side effects specific to Adcetris including peripheral neuropathy, liver inflammation, colitis, lung inflammation, rash, kidney inflammation and infection.  Reviewed infusion room and office policy and procedure and phone numbers 24 hours x 7 days a week.   Reviewed when to call the office with any concerns or problems.  Scientist, clinical (histocompatibility and immunogenetics) given.   Antiemetic protocol and chemotherapy schedule reviewed. Wife verbalized understanding of chemotherapy indications and possible side effects.  Teachback done

## 2020-07-10 ENCOUNTER — Inpatient Hospital Stay: Payer: BC Managed Care – PPO | Attending: Hematology & Oncology

## 2020-07-10 ENCOUNTER — Encounter: Payer: Self-pay | Admitting: *Deleted

## 2020-07-10 ENCOUNTER — Encounter: Payer: Self-pay | Admitting: Medical

## 2020-07-10 ENCOUNTER — Encounter: Payer: Self-pay | Admitting: Hematology & Oncology

## 2020-07-10 ENCOUNTER — Other Ambulatory Visit: Payer: Self-pay

## 2020-07-10 ENCOUNTER — Inpatient Hospital Stay: Payer: BC Managed Care – PPO

## 2020-07-10 VITALS — BP 100/67 | HR 90 | Resp 20

## 2020-07-10 DIAGNOSIS — R61 Generalized hyperhidrosis: Secondary | ICD-10-CM | POA: Insufficient documentation

## 2020-07-10 DIAGNOSIS — Z930 Tracheostomy status: Secondary | ICD-10-CM | POA: Insufficient documentation

## 2020-07-10 DIAGNOSIS — R110 Nausea: Secondary | ICD-10-CM | POA: Diagnosis not present

## 2020-07-10 DIAGNOSIS — R0602 Shortness of breath: Secondary | ICD-10-CM | POA: Insufficient documentation

## 2020-07-10 DIAGNOSIS — Z5112 Encounter for antineoplastic immunotherapy: Secondary | ICD-10-CM | POA: Diagnosis present

## 2020-07-10 DIAGNOSIS — M7989 Other specified soft tissue disorders: Secondary | ICD-10-CM | POA: Insufficient documentation

## 2020-07-10 DIAGNOSIS — D649 Anemia, unspecified: Secondary | ICD-10-CM | POA: Insufficient documentation

## 2020-07-10 DIAGNOSIS — C8110 Nodular sclerosis classical Hodgkin lymphoma, unspecified site: Secondary | ICD-10-CM | POA: Diagnosis not present

## 2020-07-10 DIAGNOSIS — R062 Wheezing: Secondary | ICD-10-CM | POA: Diagnosis not present

## 2020-07-10 DIAGNOSIS — C8112 Nodular sclerosis classical Hodgkin lymphoma, intrathoracic lymph nodes: Secondary | ICD-10-CM

## 2020-07-10 DIAGNOSIS — R5383 Other fatigue: Secondary | ICD-10-CM | POA: Insufficient documentation

## 2020-07-10 LAB — CMP (CANCER CENTER ONLY)
ALT: 21 U/L (ref 0–44)
AST: 19 U/L (ref 15–41)
Albumin: 3.1 g/dL — ABNORMAL LOW (ref 3.5–5.0)
Alkaline Phosphatase: 59 U/L (ref 38–126)
Anion gap: 3 — ABNORMAL LOW (ref 5–15)
BUN: 40 mg/dL — ABNORMAL HIGH (ref 8–23)
CO2: 46 mmol/L — ABNORMAL HIGH (ref 22–32)
Calcium: 10.7 mg/dL — ABNORMAL HIGH (ref 8.9–10.3)
Chloride: 90 mmol/L — ABNORMAL LOW (ref 98–111)
Creatinine: 0.87 mg/dL (ref 0.61–1.24)
GFR, Estimated: >60 mL/min (ref >60)
Glucose, Bld: 133 mg/dL — ABNORMAL HIGH (ref 70–99)
Potassium: 4.7 mmol/L (ref 3.5–5.1)
Sodium: 139 mmol/L (ref 135–145)
Total Bilirubin: 0.5 mg/dL (ref 0.3–1.2)
Total Protein: 7.9 g/dL (ref 6.5–8.1)

## 2020-07-10 LAB — CBC WITH DIFFERENTIAL (CANCER CENTER ONLY)
Abs Immature Granulocytes: 0.07 10*3/uL (ref 0.00–0.07)
Basophils Absolute: 0 10*3/uL (ref 0.0–0.1)
Basophils Relative: 0 %
Eosinophils Absolute: 0.9 10*3/uL — ABNORMAL HIGH (ref 0.0–0.5)
Eosinophils Relative: 6 %
HCT: 31.4 % — ABNORMAL LOW (ref 39.0–52.0)
Hemoglobin: 9.5 g/dL — ABNORMAL LOW (ref 13.0–17.0)
Immature Granulocytes: 0 %
Lymphocytes Relative: 14 %
Lymphs Abs: 2.2 10*3/uL (ref 0.7–4.0)
MCH: 26.1 pg (ref 26.0–34.0)
MCHC: 30.3 g/dL (ref 30.0–36.0)
MCV: 86.3 fL (ref 80.0–100.0)
Monocytes Absolute: 1.4 10*3/uL — ABNORMAL HIGH (ref 0.1–1.0)
Monocytes Relative: 9 %
Neutro Abs: 11 10*3/uL — ABNORMAL HIGH (ref 1.7–7.7)
Neutrophils Relative %: 71 %
Platelet Count: 247 10*3/uL (ref 150–400)
RBC: 3.64 MIL/uL — ABNORMAL LOW (ref 4.22–5.81)
RDW: 15.7 % — ABNORMAL HIGH (ref 11.5–15.5)
WBC Count: 15.6 10*3/uL — ABNORMAL HIGH (ref 4.0–10.5)
nRBC: 0 % (ref 0.0–0.2)

## 2020-07-10 MED ORDER — DIPHENHYDRAMINE HCL 50 MG/ML IJ SOLN
50.0000 mg | Freq: Once | INTRAMUSCULAR | Status: AC
  Administered 2020-07-10: 50 mg via INTRAVENOUS

## 2020-07-10 MED ORDER — SODIUM CHLORIDE 0.9 % IV SOLN
1.8000 mg/kg | Freq: Once | INTRAVENOUS | Status: AC
  Administered 2020-07-10: 120 mg via INTRAVENOUS
  Filled 2020-07-10: qty 24

## 2020-07-10 MED ORDER — HEPARIN SOD (PORK) LOCK FLUSH 100 UNIT/ML IV SOLN
500.0000 [IU] | Freq: Once | INTRAVENOUS | Status: AC | PRN
  Administered 2020-07-10: 500 [IU]
  Filled 2020-07-10: qty 5

## 2020-07-10 MED ORDER — DIPHENHYDRAMINE HCL 50 MG/ML IJ SOLN
INTRAMUSCULAR | Status: AC
  Filled 2020-07-10: qty 1

## 2020-07-10 MED ORDER — SODIUM CHLORIDE 0.9% FLUSH
10.0000 mL | INTRAVENOUS | Status: DC | PRN
  Administered 2020-07-10: 10 mL
  Filled 2020-07-10: qty 10

## 2020-07-10 MED ORDER — SODIUM CHLORIDE 0.9 % IV SOLN
10.0000 mg | Freq: Once | INTRAVENOUS | Status: AC
  Administered 2020-07-10: 10 mg via INTRAVENOUS
  Filled 2020-07-10: qty 10

## 2020-07-10 MED ORDER — ACETAMINOPHEN 325 MG PO TABS
ORAL_TABLET | ORAL | Status: AC
  Filled 2020-07-10: qty 2

## 2020-07-10 MED ORDER — ACETAMINOPHEN 325 MG PO TABS
650.0000 mg | ORAL_TABLET | Freq: Once | ORAL | Status: AC
  Administered 2020-07-10: 650 mg via ORAL

## 2020-07-10 MED ORDER — SODIUM CHLORIDE 0.9 % IV SOLN
Freq: Once | INTRAVENOUS | Status: AC
  Filled 2020-07-10: qty 250

## 2020-07-10 NOTE — Telephone Encounter (Signed)
Spoke with Steve [name] this AM , made her aware of the pharmacy medication list still not being sent over states she will try to gather all medications and drop a list off to the office

## 2020-07-10 NOTE — Patient Instructions (Signed)
[facility] Discharge Instructions for Patients Receiving Chemotherapy  Today you received the following chemotherapy agents Brentuximab   To help prevent nausea and vomiting after your treatment, we encourage you to take your nausea medication as prescribed by MD.   If you develop nausea and vomiting that is not controlled by your nausea medication, call the clinic.   BELOW ARE SYMPTOMS THAT SHOULD BE REPORTED IMMEDIATELY:  *FEVER GREATER THAN 100.5 F  *CHILLS WITH OR WITHOUT FEVER  NAUSEA AND VOMITING THAT IS NOT CONTROLLED WITH YOUR NAUSEA MEDICATION  *UNUSUAL SHORTNESS OF BREATH  *UNUSUAL BRUISING OR BLEEDING  TENDERNESS IN MOUTH AND THROAT WITH OR WITHOUT PRESENCE OF ULCERS  *URINARY PROBLEMS  *BOWEL PROBLEMS  UNUSUAL RASH Items with * indicate a potential emergency and should be followed up as soon as possible.  Feel free to call the clinic should you have any questions or concerns. The clinic phone number is [phone].  Please show the [facility] at check-in to the Emergency Department and triage nurse.

## 2020-07-10 NOTE — Patient Instructions (Signed)

## 2020-07-10 NOTE — Progress Notes (Signed)
Patient here with his wife to start his new treatment for recurrent lymphoma. He is in good spirits and is ready to begin this new treatment. Reviewed the treatment with him and his wife. Discussed the on-call coverage for this weekend. [name] doesn't have any further questions at this time.  Oncology Nurse Navigator Documentation  Oncology Nurse Navigator Flowsheets 07/10/2020  Abnormal Finding Date -  Confirmed Diagnosis Date -  Diagnosis Status -  Planned Course of Treatment -  Phase of Treatment Chemo  Chemotherapy Pending- Reason: -  Chemotherapy Actual Start Date: 07/10/2020  Navigator Follow Up Date: 07/31/2020  Navigator Follow Up Reason: Follow-up Appointment;Chemotherapy  Navigator Location [facility]  Referral Date to RadOnc/MedOnc -  Navigator Encounter Type Treatment  Telephone -  Treatment Initiated Date 07/10/2020  Patient Visit Type MedOnc  Treatment Phase First Chemo Tx  Barriers/Navigation Needs Coordination of Care;Education;Employed;Family Concerns;Morbidities/Frailty;Multiple Hospital Admissions  Education Pain/ Symptom Management;Other  Interventions Education;Psycho-Social Support  Acuity Level 4-High Needs (Greater Than 4 Barriers Identified)  Referrals -  Coordination of Care -  Education Method Verbal  Support Groups/Services Friends and Family  Time Spent with Patient 72

## 2020-07-10 NOTE — Telephone Encounter (Signed)
Wife sent medication list via mychart and mychart message sent to PCP

## 2020-07-11 ENCOUNTER — Telehealth: Payer: Self-pay | Admitting: Medical

## 2020-07-11 MED ORDER — TRAZODONE HCL 100 MG PO TABS: 100.0000 mg | ORAL_TABLET | Freq: Every day | ORAL | 3 refills | Status: DC

## 2020-07-11 MED ORDER — FUROSEMIDE 20 MG PO TABS: 20.0000 mg | ORAL_TABLET | Freq: Every day | ORAL | 1 refills | Status: DC

## 2020-07-11 MED ORDER — ENOXAPARIN SODIUM 40 MG/0.4ML ~~LOC~~ SOLN: 40.0000 mg | SUBCUTANEOUS | 0 refills | Status: DC

## 2020-07-11 NOTE — Telephone Encounter (Signed)
Refill lovenox, lasix and trazedone per refill requiest sent by my chart.

## 2020-07-13 MED ORDER — ALBUTEROL SULFATE (2.5 MG/3ML) 0.083% IN NEBU: 2.5000 mg | INHALATION_SOLUTION | RESPIRATORY_TRACT | 6 refills | Status: DC | PRN

## 2020-07-14 ENCOUNTER — Inpatient Hospital Stay (HOSPITAL_COMMUNITY)
Admission: EM | Admit: 2020-07-14 | Discharge: 2020-07-20 | DRG: 207 | Disposition: A | Discharge status: Home / Self Care | Payer: BC Managed Care – PPO | Attending: Internal Medicine | Admitting: Internal Medicine

## 2020-07-14 ENCOUNTER — Encounter (HOSPITAL_COMMUNITY): Payer: Self-pay

## 2020-07-14 ENCOUNTER — Encounter: Payer: Self-pay | Admitting: *Deleted

## 2020-07-14 ENCOUNTER — Other Ambulatory Visit: Payer: Self-pay

## 2020-07-14 ENCOUNTER — Emergency Department (HOSPITAL_COMMUNITY): Payer: BC Managed Care – PPO

## 2020-07-14 DIAGNOSIS — G9341 Metabolic encephalopathy: Secondary | ICD-10-CM | POA: Diagnosis present

## 2020-07-14 DIAGNOSIS — I959 Hypotension, unspecified: Secondary | ICD-10-CM | POA: Diagnosis not present

## 2020-07-14 DIAGNOSIS — K219 Gastro-esophageal reflux disease without esophagitis: Secondary | ICD-10-CM | POA: Diagnosis present

## 2020-07-14 DIAGNOSIS — L89302 Pressure ulcer of unspecified buttock, stage 2: Secondary | ICD-10-CM | POA: Diagnosis present

## 2020-07-14 DIAGNOSIS — J9621 Acute and chronic respiratory failure with hypoxia: Secondary | ICD-10-CM | POA: Diagnosis present

## 2020-07-14 DIAGNOSIS — E440 Moderate protein-calorie malnutrition: Secondary | ICD-10-CM | POA: Diagnosis present

## 2020-07-14 DIAGNOSIS — Z6822 Body mass index (BMI) 22.0-22.9, adult: Secondary | ICD-10-CM

## 2020-07-14 DIAGNOSIS — G4733 Obstructive sleep apnea (adult) (pediatric): Secondary | ICD-10-CM | POA: Diagnosis present

## 2020-07-14 DIAGNOSIS — Z79899 Other long term (current) drug therapy: Secondary | ICD-10-CM

## 2020-07-14 DIAGNOSIS — D638 Anemia in other chronic diseases classified elsewhere: Secondary | ICD-10-CM | POA: Diagnosis present

## 2020-07-14 DIAGNOSIS — J9602 Acute respiratory failure with hypercapnia: Secondary | ICD-10-CM | POA: Diagnosis not present

## 2020-07-14 DIAGNOSIS — C8112 Nodular sclerosis classical Hodgkin lymphoma, intrathoracic lymph nodes: Secondary | ICD-10-CM | POA: Diagnosis not present

## 2020-07-14 DIAGNOSIS — D630 Anemia in neoplastic disease: Secondary | ICD-10-CM | POA: Diagnosis present

## 2020-07-14 DIAGNOSIS — J479 Bronchiectasis, uncomplicated: Secondary | ICD-10-CM | POA: Diagnosis not present

## 2020-07-14 DIAGNOSIS — J8410 Pulmonary fibrosis, unspecified: Secondary | ICD-10-CM | POA: Diagnosis present

## 2020-07-14 DIAGNOSIS — U099 Post covid-19 condition, unspecified: Secondary | ICD-10-CM | POA: Diagnosis present

## 2020-07-14 DIAGNOSIS — Z20822 Contact with and (suspected) exposure to covid-19: Secondary | ICD-10-CM | POA: Diagnosis present

## 2020-07-14 DIAGNOSIS — T451X5A Adverse effect of antineoplastic and immunosuppressive drugs, initial encounter: Secondary | ICD-10-CM | POA: Diagnosis present

## 2020-07-14 DIAGNOSIS — C8190 Hodgkin lymphoma, unspecified, unspecified site: Secondary | ICD-10-CM | POA: Diagnosis present

## 2020-07-14 DIAGNOSIS — G620 Drug-induced polyneuropathy: Secondary | ICD-10-CM | POA: Diagnosis present

## 2020-07-14 DIAGNOSIS — J470 Bronchiectasis with acute lower respiratory infection: Secondary | ICD-10-CM | POA: Diagnosis present

## 2020-07-14 DIAGNOSIS — J189 Pneumonia, unspecified organism: Principal | ICD-10-CM | POA: Diagnosis present

## 2020-07-14 DIAGNOSIS — Z8616 Personal history of COVID-19: Secondary | ICD-10-CM

## 2020-07-14 DIAGNOSIS — E1165 Type 2 diabetes mellitus with hyperglycemia: Secondary | ICD-10-CM | POA: Diagnosis present

## 2020-07-14 DIAGNOSIS — Z96643 Presence of artificial hip joint, bilateral: Secondary | ICD-10-CM | POA: Diagnosis present

## 2020-07-14 DIAGNOSIS — D649 Anemia, unspecified: Secondary | ICD-10-CM | POA: Diagnosis not present

## 2020-07-14 DIAGNOSIS — Z79891 Long term (current) use of opiate analgesic: Secondary | ICD-10-CM

## 2020-07-14 DIAGNOSIS — J9622 Acute and chronic respiratory failure with hypercapnia: Secondary | ICD-10-CM | POA: Diagnosis present

## 2020-07-14 DIAGNOSIS — Z930 Tracheostomy status: Secondary | ICD-10-CM | POA: Diagnosis not present

## 2020-07-14 DIAGNOSIS — J9611 Chronic respiratory failure with hypoxia: Secondary | ICD-10-CM | POA: Diagnosis not present

## 2020-07-14 DIAGNOSIS — J159 Unspecified bacterial pneumonia: Principal | ICD-10-CM | POA: Diagnosis present

## 2020-07-14 DIAGNOSIS — I10 Essential (primary) hypertension: Secondary | ICD-10-CM | POA: Diagnosis present

## 2020-07-14 DIAGNOSIS — E119 Type 2 diabetes mellitus without complications: Secondary | ICD-10-CM | POA: Diagnosis not present

## 2020-07-14 DIAGNOSIS — Z808 Family history of malignant neoplasm of other organs or systems: Secondary | ICD-10-CM | POA: Diagnosis not present

## 2020-07-14 DIAGNOSIS — Z931 Gastrostomy status: Secondary | ICD-10-CM

## 2020-07-14 DIAGNOSIS — D6481 Anemia due to antineoplastic chemotherapy: Secondary | ICD-10-CM | POA: Diagnosis present

## 2020-07-14 DIAGNOSIS — Z8249 Family history of ischemic heart disease and other diseases of the circulatory system: Secondary | ICD-10-CM

## 2020-07-14 DIAGNOSIS — F419 Anxiety disorder, unspecified: Secondary | ICD-10-CM | POA: Diagnosis present

## 2020-07-14 DIAGNOSIS — L8990 Pressure ulcer of unspecified site, unspecified stage: Secondary | ICD-10-CM | POA: Diagnosis present

## 2020-07-14 DIAGNOSIS — Z9981 Dependence on supplemental oxygen: Secondary | ICD-10-CM | POA: Diagnosis not present

## 2020-07-14 DIAGNOSIS — L89152 Pressure ulcer of sacral region, stage 2: Secondary | ICD-10-CM | POA: Diagnosis not present

## 2020-07-14 LAB — COMPREHENSIVE METABOLIC PANEL
ALT: 21 U/L (ref 0–44)
AST: 21 U/L (ref 15–41)
Albumin: 2.8 g/dL — ABNORMAL LOW (ref 3.5–5.0)
Alkaline Phosphatase: 54 U/L (ref 38–126)
Anion gap: 7 (ref 5–15)
BUN: 54 mg/dL — ABNORMAL HIGH (ref 8–23)
CO2: 36 mmol/L — ABNORMAL HIGH (ref 22–32)
Calcium: 9.8 mg/dL (ref 8.9–10.3)
Chloride: 93 mmol/L — ABNORMAL LOW (ref 98–111)
Creatinine, Ser: 0.87 mg/dL (ref 0.61–1.24)
GFR, Estimated: >60 mL/min (ref >60)
Glucose, Bld: 148 mg/dL — ABNORMAL HIGH (ref 70–99)
Potassium: 3.7 mmol/L (ref 3.5–5.1)
Sodium: 136 mmol/L (ref 135–145)
Total Bilirubin: 1 mg/dL (ref 0.3–1.2)
Total Protein: 7.8 g/dL (ref 6.5–8.1)

## 2020-07-14 LAB — CBC WITH DIFFERENTIAL/PLATELET
Abs Immature Granulocytes: 0.05 10*3/uL (ref 0.00–0.07)
Basophils Absolute: 0 10*3/uL (ref 0.0–0.1)
Basophils Relative: 0 %
Eosinophils Absolute: 0.3 10*3/uL (ref 0.0–0.5)
Eosinophils Relative: 2 %
HCT: 31.1 % — ABNORMAL LOW (ref 39.0–52.0)
Hemoglobin: 9.3 g/dL — ABNORMAL LOW (ref 13.0–17.0)
Immature Granulocytes: 0 %
Lymphocytes Relative: 3 %
Lymphs Abs: 0.6 10*3/uL — ABNORMAL LOW (ref 0.7–4.0)
MCH: 26.2 pg (ref 26.0–34.0)
MCHC: 29.9 g/dL — ABNORMAL LOW (ref 30.0–36.0)
MCV: 87.6 fL (ref 80.0–100.0)
Monocytes Absolute: 1.5 10*3/uL — ABNORMAL HIGH (ref 0.1–1.0)
Monocytes Relative: 9 %
Neutro Abs: 13.9 10*3/uL — ABNORMAL HIGH (ref 1.7–7.7)
Neutrophils Relative %: 86 %
Platelets: 234 10*3/uL (ref 150–400)
RBC: 3.55 MIL/uL — ABNORMAL LOW (ref 4.22–5.81)
RDW: 16.1 % — ABNORMAL HIGH (ref 11.5–15.5)
WBC: 16.3 10*3/uL — ABNORMAL HIGH (ref 4.0–10.5)
nRBC: 0 % (ref 0.0–0.2)

## 2020-07-14 LAB — MRSA PCR SCREENING: MRSA by PCR: NEGATIVE

## 2020-07-14 LAB — RESP PANEL BY RT-PCR (FLU A&B, COVID) ARPGX2
Influenza A by PCR: NEGATIVE
Influenza B by PCR: NEGATIVE
SARS Coronavirus 2 by RT PCR: NEGATIVE

## 2020-07-14 LAB — LACTIC ACID, PLASMA: Lactic Acid, Venous: 0.8 mmol/L (ref 0.5–1.9)

## 2020-07-14 MED ORDER — PANTOPRAZOLE SODIUM 40 MG PO PACK
40.0000 mg | PACK | Freq: Every day | ORAL | Status: DC
  Administered 2020-07-15 – 2020-07-19 (×5): 40 mg
  Filled 2020-07-14 (×5): qty 20

## 2020-07-14 MED ORDER — CHOLECALCIFEROL 10 MCG (400 UNIT) PO TABS
400.0000 [IU] | ORAL_TABLET | Freq: Every day | ORAL | Status: DC
  Administered 2020-07-15 – 2020-07-20 (×6): 400 [IU]
  Filled 2020-07-14 (×6): qty 1

## 2020-07-14 MED ORDER — LANSOPRAZOLE 3 MG/ML SUSP: 15.0000 mg | Freq: Every day | ORAL | Status: DC

## 2020-07-14 MED ORDER — SODIUM CHLORIDE 0.9 % IV SOLN
2.0000 g | Freq: Once | INTRAVENOUS | Status: AC
  Administered 2020-07-14: 2 g via INTRAVENOUS
  Filled 2020-07-14: qty 2

## 2020-07-14 MED ORDER — LIDOCAINE-PRILOCAINE 2.5-2.5 % EX CREA
1.0000 "application " | TOPICAL_CREAM | Freq: Every day | CUTANEOUS | Status: DC | PRN
  Filled 2020-07-14: qty 5

## 2020-07-14 MED ORDER — ONDANSETRON HCL 4 MG/2ML IJ SOLN
4.0000 mg | Freq: Four times a day (QID) | INTRAMUSCULAR | Status: DC | PRN
  Administered 2020-07-18: 4 mg via INTRAVENOUS
  Filled 2020-07-14: qty 2

## 2020-07-14 MED ORDER — VITAMIN D3 10 MCG/ML PO LIQD: 400.0000 [IU] | Freq: Every day | ORAL | Status: DC

## 2020-07-14 MED ORDER — VANCOMYCIN HCL 1250 MG/250ML IV SOLN
1250.0000 mg | INTRAVENOUS | Status: AC
  Administered 2020-07-14: 1250 mg via INTRAVENOUS
  Filled 2020-07-14: qty 250

## 2020-07-14 MED ORDER — FREE WATER
120.0000 mL | Status: DC
  Administered 2020-07-14 – 2020-07-20 (×35): 120 mL

## 2020-07-14 MED ORDER — OSMOLITE 1.2 CAL PO LIQD
1000.0000 mL | ORAL | Status: DC
  Administered 2020-07-15 – 2020-07-20 (×5): 1000 mL
  Filled 2020-07-14: qty 1000

## 2020-07-14 MED ORDER — CHLORHEXIDINE GLUCONATE 0.12 % MT SOLN: 15.0000 mL | Freq: Two times a day (BID) | OROMUCOSAL | Status: DC | PRN

## 2020-07-14 MED ORDER — VANCOMYCIN HCL 750 MG/150ML IV SOLN
750.0000 mg | Freq: Two times a day (BID) | INTRAVENOUS | Status: DC
  Administered 2020-07-15: 750 mg via INTRAVENOUS
  Filled 2020-07-14 (×3): qty 150

## 2020-07-14 MED ORDER — PROSOURCE TF PO LIQD
30.0000 mL | Freq: Every day | ORAL | Status: DC
  Administered 2020-07-15 – 2020-07-20 (×6): 30 mL
  Filled 2020-07-14: qty 45
  Filled 2020-07-14: qty 30
  Filled 2020-07-14 (×5): qty 45

## 2020-07-14 MED ORDER — OSMOLITE 1.2 CAL PO LIQD
70.0000 mL | ORAL | Status: DC
  Administered 2020-07-14 (×2): 70 mL
  Filled 2020-07-14 (×4): qty 237

## 2020-07-14 MED ORDER — SERTRALINE HCL 50 MG PO TABS
50.0000 mg | ORAL_TABLET | Freq: Every day | ORAL | Status: DC
  Administered 2020-07-15 – 2020-07-20 (×6): 50 mg
  Filled 2020-07-14 (×6): qty 1

## 2020-07-14 MED ORDER — ALBUTEROL SULFATE (2.5 MG/3ML) 0.083% IN NEBU
2.5000 mg | INHALATION_SOLUTION | RESPIRATORY_TRACT | Status: DC | PRN
  Administered 2020-07-14: 2.5 mg via RESPIRATORY_TRACT
  Filled 2020-07-14: qty 3

## 2020-07-14 MED ORDER — GUAIFENESIN 100 MG/5ML PO SOLN
5.0000 mL | ORAL | Status: DC | PRN
  Administered 2020-07-14: 100 mg
  Filled 2020-07-14: qty 10

## 2020-07-14 MED ORDER — ONDANSETRON HCL 4 MG PO TABS: 4.0000 mg | ORAL_TABLET | Freq: Four times a day (QID) | ORAL | Status: DC | PRN

## 2020-07-14 MED ORDER — ADULT MULTIVITAMIN LIQUID CH
15.0000 mL | Freq: Every day | ORAL | Status: DC
  Administered 2020-07-15 – 2020-07-20 (×6): 15 mL
  Filled 2020-07-14 (×6): qty 15

## 2020-07-14 MED ORDER — LORAZEPAM 0.5 MG PO TABS: 0.5000 mg | ORAL_TABLET | Freq: Four times a day (QID) | ORAL | Status: DC | PRN

## 2020-07-14 MED ORDER — FUROSEMIDE 20 MG PO TABS
20.0000 mg | ORAL_TABLET | Freq: Every day | ORAL | Status: DC
  Administered 2020-07-15 – 2020-07-20 (×6): 20 mg
  Filled 2020-07-14 (×6): qty 1

## 2020-07-14 MED ORDER — SODIUM CHLORIDE 0.9 % IV SOLN: Freq: Once | INTRAVENOUS | Status: AC

## 2020-07-14 MED ORDER — SODIUM CHLORIDE 0.9 % IV SOLN
2.0000 g | Freq: Three times a day (TID) | INTRAVENOUS | Status: DC
  Administered 2020-07-14 – 2020-07-20 (×17): 2 g via INTRAVENOUS
  Filled 2020-07-14 (×18): qty 2

## 2020-07-14 MED ORDER — TRAZODONE HCL 50 MG PO TABS
100.0000 mg | ORAL_TABLET | Freq: Every day | ORAL | Status: DC
  Administered 2020-07-14 – 2020-07-19 (×6): 100 mg
  Filled 2020-07-14: qty 2
  Filled 2020-07-14: qty 1
  Filled 2020-07-14 (×4): qty 2

## 2020-07-14 MED ORDER — ENOXAPARIN SODIUM 40 MG/0.4ML ~~LOC~~ SOLN
40.0000 mg | SUBCUTANEOUS | Status: DC
  Administered 2020-07-14 – 2020-07-19 (×6): 40 mg via SUBCUTANEOUS
  Filled 2020-07-14 (×5): qty 0.4

## 2020-07-14 NOTE — ED Notes (Signed)
Pt trach suctioned by RT on arrival with result

## 2020-07-14 NOTE — ED Triage Notes (Signed)
Per EMS-patient with trach-green sputum-states he is on 8 L-states possible PNA-running a fever

## 2020-07-14 NOTE — ED Provider Notes (Signed)
[facility] Provider Note   CSN: [id number] Arrival date & time: 07/14/20  1128     History Chief Complaint  Patient presents with  . Shortness of Breath  . Fever    Steve [name] is a 65 y.o. male presented for evaluation of shortness of breath, cough, fever.  Patient states he developed increased shortness of breath and thickened respiratory sputum.  He also reports a fever that began today.  He has not taken anything for his symptoms.  He is currently being treated for cancer for Hodgkin's lymphoma.  He also has a trach present since having Covid.  He lives at home with his wife, who helps manage his medications.  He denies chest pain, nausea, vomiting, abdominal pain, urinary symptoms, abnormal bowel movements.  Additional history obtained from chart review.  Patient with a history of COVID-19 resulting in ARDS and trach placement and subsequent pulmonary fibrosis.  Additional history of diabetes, Hodgkin's lymphoma on immunosuppression.   HPI     Past Medical History:  Diagnosis Date  . Abscess of muscle 08/10/2011   staph infection of right hip   . Acute on chronic respiratory failure with hypoxia ([place])   . Acute respiratory distress syndrome (ARDS) due to COVID-19 virus ([place])   . Anxiety   . COVID-19 virus infection   . Diabetes mellitus without complication ([place])   . GERD (gastroesophageal reflux disease)    rare reflux - no meds for reflux - NO PROBLEM IN PAST SEVERAL YRS  . HCAP (healthcare-associated pneumonia)   . Healthcare-associated pneumonia   . Hip dysplasia, congenital    no surgery as a child for hip dysplasia - has had bilateral hip replacements as an adult  . Hodgkin lymphoma ([place])   . Hodgkin's lymphoma ([place])   . Hypertension   . Pancreatitis   . Pneumothorax, acute   . Postoperative anemia due to acute blood loss 09/07/2012  . Septic arthritis of hip ([place]) 09/05/2012   PT'S TOTAL HIP JOINT REMOVED -  ANTIBIOTIC SPACE PLACED AND PT HAS FINISHED IV ANTIBIOTICS ( PICC LINE REMOVED)  . Sleep apnea    USES CPAP    Patient Active Problem List   Diagnosis Date Noted  . Pneumonia 07/14/2020  . Pulmonary fibrosis ([place]) 05/26/2020  . Bronchiectasis ([place]) 05/26/2020  . Acute respiratory failure with hypoxia ([place]) 05/26/2020  . Acute on chronic respiratory failure with hypoxia ([place]) 05/25/2020  . Uncontrolled type 2 diabetes mellitus with hyperglycemia, with long-term current use of insulin ([place]) 05/25/2020  . Essential hypertension 05/25/2020  . GERD without esophagitis 05/25/2020  . Debility   . Anxiety   . Tracheostomy care ([place])   . Pneumothorax on right 03/23/2020  . Chronic respiratory failure with hypoxia ([place])   . ARDS (adult respiratory distress syndrome) ([place])   . Hodgkin's lymphoma ([place])   . Pneumothorax, acute   . Healthcare-associated pneumonia   . COVID-19 virus infection   . On mechanically assisted ventilation ([place])   . Dysphagia   . Pneumothorax   . Subcutaneous air ([place])   . Tracheostomy dependence ([place])   . Pneumomediastinum ([place])   . Acute respiratory distress syndrome (ARDS) due to COVID-19 virus ([place])   . Pressure injury of skin 07/27/2019  . Acute respiratory failure with hypoxemia ([place])   . 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 Malnutrition of moderate degree 07/10/2019  . HCAP (healthcare-associated pneumonia)   . Hypoxia   .  Febrile neutropenia ([place]) 05/17/2019  . COVID-19 05/17/2019  . Anemia 05/17/2019  . Hyponatremia 05/17/2019  . Protein-calorie malnutrition, severe ([place]) 05/17/2019  . Neutropenic fever ([place]) 05/16/2019  . 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 Chemotherapy-induced diarrhea 04/22/2019  . Anemia due to antineoplastic chemotherapy 03/25/2019  . Hodgkin's lymphoma ([place]) 02/25/2019  . Abnormal CT scan, pancreas - tail area 01/31/2013  . Prosthetic joint infection ([place]) 09/20/2012  . Septic  arthritis of hip ([place]) 09/05/2012  . Routine general medical examination at a health care facility 09/12/2011  . Elbow pain 09/12/2011  . Abscess of muscle 08/10/2011  . H/O dental abscess 08/10/2011  . Staphylococcus aureus bacteremia 07/27/2011  . Abdominal pain, RLQ 09/02/2010  . HYPOGONADISM, MALE 03/23/2007  . SLEEP APNEA 03/23/2007    Past Surgical History:  Procedure Laterality Date  . COLONOSCOPY  04/26/2007  . HERNIA REPAIR     inguinal hernia x3  . IR GASTROSTOMY TUBE MOD SED  08/22/2019  . IR IMAGING GUIDED PORT INSERTION  03/29/2019  . JOINT REPLACEMENT  2002 & 2007   bilateral hip replacement  . LYMPH NODE BIOPSY Right 03/20/2019   Procedure: DEEP RIGHT SUPRACLAVICULAR LYMPH NODE EXCISION;  Surgeon: Fanny Skates, MD;  Location: [facility];  Service: General;  Laterality: Right;  . MULTIPLE EXTRACTIONS WITH ALVEOLOPLASTY  07/28/2011   Procedure: MULTIPLE EXTRACION WITH ALVEOLOPLASTY;  Surgeon: Lenn Cal, DDS;  Location: [facility];  Service: Oral Surgery;  Laterality: N/A;  Extraction of tooth #'s 2,3,4,5,6,11,12,13,15,19,22 with alveoloplasty.  . shoulder repair - right for separation of shoulder    . TEE WITHOUT CARDIOVERSION  07/29/2011   Procedure: TRANSESOPHAGEAL ECHOCARDIOGRAM (TEE);  Surgeon: Josue Hector, MD;  Location: [facility];  Service: Cardiovascular;  Laterality: N/A;  . TOTAL HIP REVISION Right 09/05/2012   Procedure: RIGHT HIP RESECTION ARTHROPLASTY WITH ANTIBIOTIC SPACERS;  Surgeon: [name], MD;  Location: [facility];  Service: Orthopedics;  Laterality: Right;  . TOTAL HIP REVISION Right 11/30/2012   Procedure: RIGHT TOTAL HIP ARTHROPLASTY REIMPLANTATION;  Surgeon: [name], MD;  Location: [facility];  Service: Orthopedics;  Laterality: Right;       Family History  Problem Relation Age of Onset  . Melanoma Mother   . Heart attack Mother   . Hyperlipidemia Neg Hx   . Sudden death Neg Hx   . Hypertension Neg Hx   .  Diabetes Neg Hx     Social History   Tobacco Use  . Smoking status: Never Smoker  . Smokeless tobacco: Never Used  Vaping Use  . Vaping Use: Never used  Substance Use Topics  . Alcohol use: Yes    Comment: rare beer. 5 times a year at most. 2 beers when he drinks.  . Drug use: No    Home Medications Prior to Admission medications   Medication Sig Start Date End Date Taking? Authorizing Provider  albuterol (PROVENTIL) (2.5 MG/3ML) 0.083% nebulizer solution Take 3 mLs (2.5 mg total) by nebulization every 2 (two) hours as needed for wheezing. 07/13/20  Yes Saguier, Percell Miller, PA-C  Amino Acids-Protein Hydrolys (FEEDING SUPPLEMENT, PRO-STAT SUGAR FREE 64,) LIQD Place 30 mLs into feeding tube daily.   Yes [provider]  chlorhexidine (PERIDEX) 0.12 % solution 15 mLs by Mouth Rinse route 2 (two) times daily. Patient taking differently: 15 mLs by Mouth Rinse route 2 (two) times daily as needed (mouth pain). 04/17/20  Yes Florencia Reasons, MD  Cholecalciferol (VITAMIN D3) 10 MCG/ML LIQD Give 400  Units by tube daily. Patient taking differently: Place 400 Units into feeding tube daily. 04/17/20  Yes Florencia Reasons, MD  enoxaparin (LOVENOX) 40 MG/0.4ML injection Inject 0.4 mLs (40 mg total) into the skin daily. 07/11/20  Yes Saguier, Percell Miller, PA-C  furosemide (LASIX) 20 MG tablet Place 1 tablet (20 mg total) into feeding tube daily. 07/11/20  Yes Saguier, Percell Miller, PA-C  lidocaine-prilocaine (EMLA) cream Apply to affected area once Patient taking differently: Apply 1 application topically daily as needed (access port). Apply to affected area once 07/09/20  Yes Ennever, Rudell Cobb, MD  LORazepam (ATIVAN) 0.5 MG tablet Take 1 tablet (0.5 mg total) by mouth every 6 (six) hours as needed (Nausea or vomiting). 07/09/20  Yes Volanda Napoleon, MD  Multiple Vitamin (MULTIVITAMIN) LIQD Place 15 mLs into feeding tube daily. 08/28/19  Yes Ollis, Cleaster Corin, NP  Nutritional Supplements (FEEDING SUPPLEMENT, OSMOLITE 1.2 CAL,) LIQD  Place 1,000 mLs into feeding tube continuous. Patient taking differently: Place 70 mLs into feeding tube continuous. 04/17/20  Yes Florencia Reasons, MD  sertraline (ZOLOFT) 50 MG tablet GIVE 1 TAB PER TUBE DAILY 07/07/20  Yes Saguier, Percell Miller, PA-C  traZODone (DESYREL) 100 MG tablet Place 1 tablet (100 mg total) into feeding tube at bedtime. 07/11/20  Yes Saguier, Percell Miller, PA-C  triamcinolone (KENALOG) 0.1 % Apply 1 application topically 2 (two) times daily as needed (itching).   Yes [provider]  Water For Irrigation, Sterile (FREE WATER) SOLN Place 100 mLs into feeding tube every 4 (four) hours. Patient taking differently: Place 120 mLs into feeding tube every 4 (four) hours. 04/17/20  Yes Florencia Reasons, MD  acetaminophen (TYLENOL) 160 MG/5ML solution Place 20.3 mLs (650 mg total) into feeding tube every 4 (four) hours as needed for mild pain or fever (temp > 101.5). Patient not taking: No sig reported 08/27/19   Donita Brooks, NP  Chlorhexidine Gluconate Cloth 2 % PADS Apply 6 each topically daily. Patient not taking: No sig reported 04/18/20   Florencia Reasons, MD  LORazepam (ATIVAN) 0.5 MG tablet Place 1 tablet (0.5 mg total) into feeding tube every 4 (four) hours as needed for anxiety. Patient not taking: Reported on 07/14/2020 04/17/20   Florencia Reasons, MD  Nutritional Supplements (FEEDING SUPPLEMENT, PROSOURCE TF,) liquid Place 45 mLs into feeding tube 3 (three) times daily. Patient not taking: No sig reported 04/17/20   Florencia Reasons, MD    Allergies    Patient has no known allergies.  Review of Systems   Review of Systems  Constitutional: Positive for fever.  Respiratory: Positive for cough and shortness of breath.   Allergic/Immunologic: Positive for immunocompromised state.  All other systems reviewed and are negative.   Physical Exam Updated Vital Signs BP 110/73   Pulse 95   Temp 98.3 F (36.8 C) (Oral)   Resp (!) 27   Ht 5\' 6"  (1.676 m)   Wt 62.1 kg   SpO2 100%   BMI 22.11 kg/m   Physical  Exam Vitals and nursing note reviewed.  Constitutional:      Appearance: He is underweight. He is ill-appearing.     Comments: Appears chronically ill  HENT:     Head: Normocephalic and atraumatic.  Eyes:     Extraocular Movements: Extraocular movements intact.     Conjunctiva/sclera: Conjunctivae normal.     Pupils: Pupils are equal, round, and reactive to light.  Cardiovascular:     Rate and Rhythm: Regular rhythm. Tachycardia present.     Pulses: Normal pulses.  Comments: Mildly tachycardic 105 Pulmonary:     Effort: No respiratory distress.     Breath sounds: Rhonchi and rales present. No wheezing.  Abdominal:     General: There is no distension.     Palpations: Abdomen is soft. There is no mass.     Tenderness: There is no abdominal tenderness. There is no guarding or rebound.  Musculoskeletal:        General: Normal range of motion.     Cervical back: Normal range of motion and neck supple.  Skin:    General: Skin is warm and dry.     Capillary Refill: Capillary refill takes less than 2 seconds.  Neurological:     Mental Status: He is alert and oriented to person, place, and time.     ED Results / Procedures / Treatments   Labs (all labs ordered are listed, but only abnormal results are displayed) Labs Reviewed  CBC WITH DIFFERENTIAL/PLATELET - Abnormal; Notable for the following components:      Result Value   WBC 16.3 (*)    RBC 3.55 (*)    Hemoglobin 9.3 (*)    HCT 31.1 (*)    MCHC 29.9 (*)    RDW 16.1 (*)    Neutro Abs 13.9 (*)    Lymphs Abs 0.6 (*)    Monocytes Absolute 1.5 (*)    All other components within normal limits  COMPREHENSIVE METABOLIC PANEL - Abnormal; Notable for the following components:   Chloride 93 (*)    CO2 36 (*)    Glucose, Bld 148 (*)    BUN 54 (*)    Albumin 2.8 (*)    All other components within normal limits  CULTURE, BLOOD (ROUTINE X 2)  CULTURE, BLOOD (ROUTINE X 2)  RESP PANEL BY RT-PCR (FLU A&B, COVID) ARPGX2  LACTIC  ACID, PLASMA  LEGIONELLA PNEUMOPHILA SEROGP 1 UR AG  STREP PNEUMONIAE URINARY ANTIGEN  HIV ANTIBODY (ROUTINE TESTING W REFLEX)    EKG None  Radiology DG Chest 2 View  Result Date: 07/14/2020 CLINICAL DATA:  Shortness of breath. EXAM: CHEST - 2 VIEW COMPARISON:  Chest x-ray 05/25/2020 FINDINGS: The tracheostomy tube is in good position, unchanged. The power port is in good position, unchanged. Stable mild cardiac enlargement and prominent mediastinal and hilar contours. Severe chronic underlying lung disease likely post pneumonic pulmonary fibrosis. Low lung volumes with vascular crowding and atelectasis. Slight increase in patchy airspace opacity suspicious for superimposed infiltrates. No definite pleural effusions. IMPRESSION: Severe chronic underlying lung disease with suspected superimposed infiltrates. Electronically Signed   By: Marijo Sanes M.D.   On: 07/14/2020 14:13    Procedures Procedures   Medications Ordered in ED Medications  vancomycin (VANCOREADY) IVPB 1250 mg/250 mL ( Intravenous Restarted 07/14/20 1555)  furosemide (LASIX) tablet 20 mg (has no administration in time range)  LORazepam (ATIVAN) tablet 0.5 mg (has no administration in time range)  sertraline (ZOLOFT) tablet 50 mg (has no administration in time range)  traZODone (DESYREL) tablet 100 mg (has no administration in time range)  free water 120 mL (has no administration in time range)  feeding supplement (PRO-STAT SUGAR FREE 64) liquid 30 mL (has no administration in time range)  multivitamin liquid 15 mL (has no administration in time range)  feeding supplement (OSMOLITE 1.2 CAL) liquid 70 mL (has no administration in time range)  albuterol (PROVENTIL) (2.5 MG/3ML) 0.083% nebulizer solution 2.5 mg (has no administration in time range)  chlorhexidine (PERIDEX) 0.12 % solution 15 mL (  has no administration in time range)  lidocaine-prilocaine (EMLA) cream 1 application (has no administration in time range)  0.9 %   sodium chloride infusion (has no administration in time range)  ondansetron (ZOFRAN) tablet 4 mg (has no administration in time range)    Or  ondansetron (ZOFRAN) injection 4 mg (has no administration in time range)  enoxaparin (LOVENOX) injection 40 mg (has no administration in time range)  pantoprazole sodium (PROTONIX) 40 mg/20 mL oral suspension 40 mg (has no administration in time range)  cholecalciferol (VITAMIN D3) tablet 400 Units (has no administration in time range)  ceFEPIme (MAXIPIME) 2 g in sodium chloride 0.9 % 100 mL IVPB (0 g Intravenous Stopped 07/14/20 1440)    ED Course  I have reviewed the triage vital signs and the nursing notes.  Pertinent labs & imaging results that were available during my care of the patient were reviewed by me and considered in my medical decision making (see chart for details).    MDM Rules/Calculators/A&P                          Patient presented for evaluation of worsening shortness of breath, fever, cough.  On exam, patient appears chronically ill.  He is mildly tachycardic, and has crackles bilaterally on exam.  He has a trach, on trach collar with oxygen at 8 L.  Trach secretions suctioned by respiratory prior to my evaluation, patient reports this helped his shortness of breath greatly.  I am concerned about infection in the setting of new fever and abnormal lung sounds, obtain labs including lactic, blood cultures.  Will obtain chest x-ray.  Chest x-ray viewed interpreted by me, shows worsening opacities when compared to previous.  Per radiology, significant pulmonary fibrosis with superimposed infiltrates.  This fits clinically.  Will treat for healthcare associated pneumonia as patient is immunocompromised and was hospitalized 1.5 months ago.  He will need admission.  Currently, exam is not consistent with sepsis.  Lactic is negative.  He does have mildly elevated white count, consistent with infection.  However vital signs are  reassuring.  Discussed with Dr. Marylyn Ishihara from triad hospitalist service, patient to be admitted.   Final Clinical Impression(s) / ED Diagnoses Final diagnoses:  Pneumonia of both lungs due to infectious organism, unspecified part of lung  Tracheostomy present [facility])    Rx / DC Orders ED Discharge Orders    None       Franchot Heidelberg, PA-C 07/14/20 1620    Dorie Rank, MD 07/15/20 [phone]

## 2020-07-14 NOTE — Progress Notes (Signed)
  MEWS score:Yellow. Guidelines implemented. Patient was seen by RT. Patient not in distress, no changes from previous status. Will continue to monitore.    07/14/20 1947  Assess: MEWS Score  Temp 98.8 F (37.1 C)  BP 93/62  Pulse Rate 89  Resp (!) 21  SpO2 96 %  O2 Device Tracheostomy Collar  Patient Activity (if Appropriate) In bed  O2 Flow Rate (L/min) 10 L/min  Assess: MEWS Score  MEWS Temp 0  MEWS Systolic 1  MEWS Pulse 0  MEWS RR 1  MEWS LOC 0  MEWS Score 2  MEWS Score Color Yellow  Assess: if the MEWS score is Yellow or Red  Were vital signs taken at a resting state? Yes  Focused Assessment No change from prior assessment  Early Detection of Sepsis Score *See Row Information* Low  MEWS guidelines implemented *See Row Information* Yes  Treat  MEWS Interventions Other (Comment) (seen by R already, pt not in distress)  Pain Scale 0-10  Pain Score 0  Take Vital Signs  Increase Vital Sign Frequency  Yellow: Q 2hr X 2 then Q 4hr X 2, if remains yellow, continue Q 4hrs  Escalate  MEWS: Escalate Yellow: discuss with charge nurse/RN and consider discussing with provider and RRT  Notify: Charge Nurse/RN  Name of Charge Nurse/RN Notified Phung Kotas, RN  Date Charge Nurse/RN Notified 07/14/20  Time Charge Nurse/RN Notified 1947  Notify: Provider  Provider Name/Title none  Notify: Rapid Response  Name of Rapid Response RN Notified none  Document  Patient Outcome Other (Comment) (stable, remains in the unit)  Progress note created (see row info) Yes

## 2020-07-14 NOTE — ED Triage Notes (Signed)
EMS reports from home, Trach due to Covid, worsening green sputum from [place] and fever 100.6.   BP 110/68 HR 110 RR 26 Sp02 95 8ltrs Trach  Temp 100.6  8ltrs 24-7 trach collar  1gm Tylenol PO enroutre

## 2020-07-14 NOTE — Progress Notes (Signed)
A consult was received from an ED physician for Vancomycin per pharmacy dosing.  The patient's profile has been reviewed for ht/wt/allergies/indication/available labs.    A one time order has been placed for Vancomycin 1250mg  IV.  Further antibiotics/pharmacy consults should be ordered by admitting physician if indicated.                       Thank you, [name] 07/14/2020  1:54 PM

## 2020-07-14 NOTE — Progress Notes (Signed)
Pharmacy Antibiotic Note  Steve [name] is a 65 y.o. male with Hodgkin's lymphoma currently undergoing chemotherapy treatment (last treatment on 07/10/20) and chronic trach, presented to the ED on 4/5 with dyspnea.  CXR showed findings with concern for PNA.  Pharmacy has been consulted to dose vancomycin and cefepime for infection    Plan: - cefepime 2 gm IV q8h - vancomycin 1250 mg IV x1 given in the ED at 3p on 4/5, then 750mg  IV q12h for est AUC 503  _______________________________________  Height: 5\' 6"  (167.6 cm) Weight: 62.1 kg (137 lb) IBW/kg (Calculated) : 63.8  Temp (24hrs), Avg:98.6 F (37 C), Min:98.3 F (36.8 C), Max:98.8 F (37.1 C)  Recent Labs  Lab 07/10/20 1130 07/14/20 1233 07/14/20 1235  WBC 15.6* 16.3*  --   CREATININE 0.87 0.87  --   LATICACIDVEN  --   --  0.8    Estimated Creatinine Clearance: 75.3 mL/min (by C-G formula based on SCr of 0.87 mg/dL).    No Known Allergies   Thank you for allowing pharmacy to be a part of this patient's care.  Lynelle Doctor 07/14/2020 4:07 PM

## 2020-07-14 NOTE — Progress Notes (Signed)
Called and spoke to patient's wife [name]. She states he had a decent weekend with no apparent side effects from the chemo. She didn't notice any change in patient status or development of any side effects. Unfortunately, he is having issues with his secretions and trach this morning. Patient is having a hard time clearing the secretions and they are becoming thicker and green in color. She states she will be bringing him to the ED for assessment.   Dr Marin Olp notified.   Oncology Nurse Navigator Documentation  Oncology Nurse Navigator Flowsheets 07/14/2020  Abnormal Finding Date -  Confirmed Diagnosis Date -  Diagnosis Status -  Planned Course of Treatment -  Phase of Treatment -  Chemotherapy Pending- Reason: -  Chemotherapy Actual Start Date: -  Navigator Follow Up Date: 07/31/2020  Navigator Follow Up Reason: Follow-up Appointment;Chemotherapy  Navigator Location [facility]  Referral Date to RadOnc/MedOnc -  Navigator Encounter Type Telephone  Telephone Patient Update;Outgoing Call  Treatment Initiated Date -  Patient Visit Type MedOnc  Treatment Phase Active Tx  Barriers/Navigation Needs Coordination of Care;Education;Employed;Family Concerns;Morbidities/Frailty;Multiple Hospital Admissions  Education Other  Interventions Education;Psycho-Social Support  Acuity Level 4-High Needs (Greater Than 4 Barriers Identified)  Referrals -  Coordination of Care -  Education Method Verbal  Support Groups/Services Friends and Family  Time Spent with Patient 30

## 2020-07-14 NOTE — Progress Notes (Signed)
MEWS score RED. Rapid nurse and Covering MD notified.   Patient for transfer to ICU/Stepdown.     07/14/20 2303  Assess: MEWS Score  Temp 98.4 F (36.9 C)  BP 98/60  Pulse Rate (!) 107  Resp (!) 28  SpO2 95 %  O2 Device Tracheostomy Collar  Assess: MEWS Score  MEWS Temp 0  MEWS Systolic 1  MEWS Pulse 1  MEWS RR 2  MEWS LOC 0  MEWS Score 4  MEWS Score Color Red  Assess: if the MEWS score is Yellow or Red  Were vital signs taken at a resting state? Yes  Focused Assessment Change from prior assessment (see assessment flowsheet)  Early Detection of Sepsis Score *See Row Information* Low  MEWS guidelines implemented *See Row Information* Yes  Treat  MEWS Interventions Escalated (See documentation below)  Pain Scale 0-10  Pain Score 0  Take Vital Signs  Increase Vital Sign Frequency  Red: Q 1hr X 4 then Q 4hr X 4, if remains red, continue Q 4hrs  Escalate  MEWS: Escalate Red: discuss with charge nurse/RN and provider, consider discussing with RRT  Notify: Charge Nurse/RN  Name of Charge Nurse/RN Notified Zaliah Wissner, RN  Date Charge Nurse/RN Notified 07/14/20  Time Charge Nurse/RN Notified 2303  Notify: Provider  Provider Name/Title T. OPYD  Date Provider Notified 07/14/20  Time Provider Notified 2317  Notification Type Page  Notification Reason Change in status  Provider response Other (Comment) (awaiting for response)  Notify: Rapid Response  Name of Rapid Response RN Notified Pamala Hurry, RN  Date Rapid Response Notified 07/14/20  Time Rapid Response Notified 2310  Document  Patient Outcome Transferred/level of care increased  Progress note created (see row info) Yes

## 2020-07-14 NOTE — Progress Notes (Signed)
Patient with elevated MEWS score of 4,  primary nurse reports that patient is requiring a lot trach suctioning and that the secretions are extremely thick.  Instructed primary nurse to notify on call provider to request  transfer orders to progressive   Orders received from Dr, Jeronimo Greaves to transfer to step down.

## 2020-07-14 NOTE — H&P (Addendum)
History and Physical    Steve [name] KGM:[id number] DOB: 02-04-1956 DOA: 07/14/2020  PCP: [name], PA-C  Patient coming from: Home  Chief Complaint: dyspnea  HPI: Steve [name] is a 65 y.o. male with medical history significant of chronic hypoxic respiratory failure w/ chronic trach and O2 use, bronchiectasis, pulmonary fibrosis, anxiety. Presenting with dyspnea. Family reports that he had some problems w/ increase cough over the past week. It was productive of yellow sputum, but he was doing ok. He was able to complete his chemo session without incident. However, over the weekend, his cough worsened. His sputum turned green and became more viscous. He was having increased difficulty breathing. He seemed to be under a good amount of distress this morning. So, he was brought to the ED for assistance. He denies any other aggravating or alleviating factors.    ED Course: CXR show infiltrates superimposed on his chronic fibrosis. He was started on vanc and cefepime. RT suctioned his trach. TRH was called for admission.   Review of Systems:  Denies CP, palpitations, N/V/D, fever/chills. Review of systems is otherwise negative for all not mentioned in HPI.   PMHx Past Medical History:  Diagnosis Date  . Abscess of muscle 08/10/2011   staph infection of right hip   . Acute on chronic respiratory failure with hypoxia ([place])   . Acute respiratory distress syndrome (ARDS) due to COVID-19 virus ([place])   . Anxiety   . COVID-19 virus infection   . Diabetes mellitus without complication ([place])   . GERD (gastroesophageal reflux disease)    rare reflux - no meds for reflux - NO PROBLEM IN PAST SEVERAL YRS  . HCAP (healthcare-associated pneumonia)   . Healthcare-associated pneumonia   . Hip dysplasia, congenital    no surgery as a child for hip dysplasia - has had bilateral hip replacements as an adult  . Hodgkin lymphoma ([place])   . Hodgkin's lymphoma ([place])   . Hypertension   . Pancreatitis    . Pneumothorax, acute   . Postoperative anemia due to acute blood loss 09/07/2012  . Septic arthritis of hip ([place]) 09/05/2012   PT'S TOTAL HIP JOINT REMOVED - ANTIBIOTIC SPACE PLACED AND PT HAS FINISHED IV ANTIBIOTICS ( PICC LINE REMOVED)  . Sleep apnea    USES CPAP    PSHx Past Surgical History:  Procedure Laterality Date  . COLONOSCOPY  04/26/2007  . HERNIA REPAIR     inguinal hernia x3  . IR GASTROSTOMY TUBE MOD SED  08/22/2019  . IR IMAGING GUIDED PORT INSERTION  03/29/2019  . JOINT REPLACEMENT  2002 & 2007   bilateral hip replacement  . LYMPH NODE BIOPSY Right 03/20/2019   Procedure: DEEP RIGHT SUPRACLAVICULAR LYMPH NODE EXCISION;  Surgeon: Fanny Skates, MD;  Location: [facility];  Service: General;  Laterality: Right;  . MULTIPLE EXTRACTIONS WITH ALVEOLOPLASTY  07/28/2011   Procedure: MULTIPLE EXTRACION WITH ALVEOLOPLASTY;  Surgeon: Lenn Cal, DDS;  Location: [facility];  Service: Oral Surgery;  Laterality: N/A;  Extraction of tooth #'s 2,3,4,5,6,11,12,13,15,19,22 with alveoloplasty.  . shoulder repair - right for separation of shoulder    . TEE WITHOUT CARDIOVERSION  07/29/2011   Procedure: TRANSESOPHAGEAL ECHOCARDIOGRAM (TEE);  Surgeon: Josue Hector, MD;  Location: [facility];  Service: Cardiovascular;  Laterality: N/A;  . TOTAL HIP REVISION Right 09/05/2012   Procedure: RIGHT HIP RESECTION ARTHROPLASTY WITH ANTIBIOTIC SPACERS;  Surgeon: [name], MD;  Location: [facility];  Service: Orthopedics;  Laterality: Right;  .  TOTAL HIP REVISION Right 11/30/2012   Procedure: RIGHT TOTAL HIP ARTHROPLASTY REIMPLANTATION;  Surgeon: [name], MD;  Location: [facility];  Service: Orthopedics;  Laterality: Right;    SocHx  reports that he has never smoked. He has never used smokeless tobacco. He reports current alcohol use. He reports that he does not use drugs.  No Known Allergies  FamHx Family History  Problem Relation Age of Onset  . Melanoma Mother   .  Heart attack Mother   . Hyperlipidemia Neg Hx   . Sudden death Neg Hx   . Hypertension Neg Hx   . Diabetes Neg Hx     Prior to Admission medications   Medication Sig Start Date End Date Taking? Authorizing Provider  acetaminophen (TYLENOL) 160 MG/5ML solution Place 20.3 mLs (650 mg total) into feeding tube every 4 (four) hours as needed for mild pain or fever (temp > 101.5). Patient not taking: Reported on 05/25/2020 08/27/19   Donita Brooks, NP  albuterol (PROVENTIL) (2.5 MG/3ML) 0.083% nebulizer solution Take 3 mLs (2.5 mg total) by nebulization every 2 (two) hours as needed for wheezing. 07/13/20   Saguier, Percell Miller, PA-C  Amino Acids-Protein Hydrolys (FEEDING SUPPLEMENT, PRO-STAT SUGAR FREE 64,) LIQD Place 30 mLs into feeding tube daily.    [provider]  chlorhexidine (PERIDEX) 0.12 % solution 15 mLs by Mouth Rinse route 2 (two) times daily. 04/17/20   Florencia Reasons, MD  Chlorhexidine Gluconate Cloth 2 % PADS Apply 6 each topically daily. Patient not taking: No sig reported 04/18/20   Florencia Reasons, MD  Cholecalciferol (VITAMIN D3) 10 MCG/ML LIQD Give 400 Units by tube daily. Patient taking differently: Place 400 Units into feeding tube daily. 04/17/20   Florencia Reasons, MD  enoxaparin (LOVENOX) 40 MG/0.4ML injection Inject 0.4 mLs (40 mg total) into the skin daily. 07/11/20   Saguier, Percell Miller, PA-C  furosemide (LASIX) 20 MG tablet Place 1 tablet (20 mg total) into feeding tube daily. 07/11/20   Saguier, Percell Miller, PA-C  HYOSYNE 0.125 MG/ML solution TAKE 1 ML VIA G-TUBE EVERY FOUR HOURS AS NEEDED 06/17/20   [provider]  lidocaine-prilocaine (EMLA) cream Apply to affected area once 07/09/20   Ennever, Rudell Cobb, MD  LORazepam (ATIVAN) 0.5 MG tablet Place 1 tablet (0.5 mg total) into feeding tube every 4 (four) hours as needed for anxiety. 04/17/20   Florencia Reasons, MD  LORazepam (ATIVAN) 0.5 MG tablet Take 1 tablet (0.5 mg total) by mouth every 6 (six) hours as needed (Nausea or vomiting). 07/09/20   Volanda Napoleon, MD  Multiple Vitamin (MULTIVITAMIN) LIQD Place 15 mLs into feeding tube daily. Patient taking differently: No sig reported 08/28/19   Donita Brooks, NP  Nutritional Supplements (FEEDING SUPPLEMENT, OSMOLITE 1.2 CAL,) LIQD Place 1,000 mLs into feeding tube continuous. 04/17/20   Florencia Reasons, MD  Nutritional Supplements (FEEDING SUPPLEMENT, PROSOURCE TF,) liquid Place 45 mLs into feeding tube 3 (three) times daily. Patient not taking: No sig reported 04/17/20   Florencia Reasons, MD  sertraline (ZOLOFT) 50 MG tablet GIVE 1 TAB PER TUBE DAILY 07/07/20   Saguier, Percell Miller, PA-C  traZODone (DESYREL) 100 MG tablet Place 1 tablet (100 mg total) into feeding tube at bedtime. 07/11/20   Saguier, Percell Miller, PA-C  triamcinolone (KENALOG) 0.1 % Apply 1 application topically 2 (two) times daily as needed (itching).    [provider]  Water For Irrigation, Sterile (FREE WATER) SOLN Place 100 mLs into feeding tube every 4 (four) hours. 04/17/20  Florencia Reasons, MD    Physical Exam: Vitals:   07/14/20 1300 07/14/20 1330 07/14/20 1352 07/14/20 1400  BP: (!) 92/59 104/71  106/80  Pulse: 94 100  (!) 105  Resp:  (!) 24  (!) 22  Temp:      SpO2: 96% 98%  100%  Weight:   62.1 kg   Height:   5\' 6"  (1.676 m)     General: 65 y.o. ill appearing male resting in bed Eyes: PERRL, normal sclera ENMT: Nares patent w/o discharge, orophaynx clear, dentition normal, ears w/o discharge/lesions/ulcers Neck: Supple, trachea midline, trach noted Cardiovascular: tachy, +S1, S2, no m/g/r, equal pulses throughout Respiratory: scattered rhochi, slightly increased WOB, no w/r GI: BS+, NDNT, no masses noted, no organomegaly noted, PEG in place MSK: No e/c/c Skin: No rashes, bruises, ulcerations noted Neuro: A&O x 3, no focal deficits Psyc: Appropriate interaction and affect, calm/cooperative  Labs on Admission: I have personally reviewed following labs and imaging studies  CBC: Recent Labs  Lab 07/10/20 1130 07/14/20 1233  WBC  15.6* 16.3*  NEUTROABS 11.0* 13.9*  HGB 9.5* 9.3*  HCT 31.4* 31.1*  MCV 86.3 87.6  PLT 247 967   Basic Metabolic Panel: Recent Labs  Lab 07/10/20 1130 07/14/20 1233  NA 139 136  K 4.7 3.7  CL 90* 93*  CO2 46* 36*  GLUCOSE 133* 148*  BUN 40* 54*  CREATININE 0.87 0.87  CALCIUM 10.7* 9.8   GFR: Estimated Creatinine Clearance: 75.3 mL/min (by C-G formula based on SCr of 0.87 mg/dL). Liver Function Tests: Recent Labs  Lab 07/10/20 1130 07/14/20 1233  AST 19 21  ALT 21 21  ALKPHOS 59 54  BILITOT 0.5 1.0  PROT 7.9 7.8  ALBUMIN 3.1* 2.8*   No results for input(s): LIPASE, AMYLASE in the last 168 hours. No results for input(s): AMMONIA in the last 168 hours. Coagulation Profile: No results for input(s): INR, PROTIME in the last 168 hours. Cardiac Enzymes: No results for input(s): CKTOTAL, CKMB, CKMBINDEX, TROPONINI in the last 168 hours. BNP (last 3 results) No results for input(s): PROBNP in the last 8760 hours. HbA1C: No results for input(s): HGBA1C in the last 72 hours. CBG: No results for input(s): GLUCAP in the last 168 hours. Lipid Profile: No results for input(s): CHOL, HDL, LDLCALC, TRIG, CHOLHDL, LDLDIRECT in the last 72 hours. Thyroid Function Tests: No results for input(s): TSH, T4TOTAL, FREET4, T3FREE, THYROIDAB in the last 72 hours. Anemia Panel: No results for input(s): VITAMINB12, FOLATE, FERRITIN, TIBC, IRON, RETICCTPCT in the last 72 hours. Urine analysis:    Component Value Date/Time   COLORURINE YELLOW 05/25/2020 0106   APPEARANCEUR CLEAR 05/25/2020 0106   LABSPEC 1.017 05/25/2020 0106   PHURINE 6.0 05/25/2020 0106   GLUCOSEU NEGATIVE 05/25/2020 0106   HGBUR SMALL (A) 05/25/2020 0106   HGBUR negative 03/23/2007 0000   BILIRUBINUR NEGATIVE 05/25/2020 0106   BILIRUBINUR neg 11/11/2016 1017   KETONESUR NEGATIVE 05/25/2020 0106   PROTEINUR NEGATIVE 05/25/2020 0106   UROBILINOGEN 0.2 11/11/2016 1017   UROBILINOGEN 0.2 11/08/2012 0915   NITRITE  NEGATIVE 05/25/2020 0106   LEUKOCYTESUR NEGATIVE 05/25/2020 0106    Radiological Exams on Admission: DG Chest 2 View  Result Date: 07/14/2020 CLINICAL DATA:  Shortness of breath. EXAM: CHEST - 2 VIEW COMPARISON:  Chest x-ray 05/25/2020 FINDINGS: The tracheostomy tube is in good position, unchanged. The power port is in good position, unchanged. Stable mild cardiac enlargement and prominent mediastinal and hilar contours. Severe chronic underlying lung disease  likely post pneumonic pulmonary fibrosis. Low lung volumes with vascular crowding and atelectasis. Slight increase in patchy airspace opacity suspicious for superimposed infiltrates. No definite pleural effusions. IMPRESSION: Severe chronic underlying lung disease with suspected superimposed infiltrates. Electronically Signed   By: Marijo Sanes M.D.   On: 07/14/2020 14:13    EKG: Independently reviewed. Sinus, no st elevations  Assessment/Plan Bilat PNA Chronic respiratory failure on chronic O2 and trach Pulmonary fibrosis Bronchiectasis     - admit to inpt, med-surg     - CXR as above     - vanc, cefepime; continue     - send urine legionella, strep     - albuterol PRN     - guaifenesin     - RT for trach care  Chronic PEG use     - PEG is functioning; continue use     - he also has oral feeds  Normocytic anemia     - no evidence of bleed, follow  Anxiety     - continue home regimen   DM2     - diet controlled     - last A1c on 05/25/20 was 5.8     - carb mod diet, check glucose with daily lab  GERD     - PPI  Hodgkin's lymphoma     - follows w/ Dr. Marin Olp; he's has been notified of pt presence by secure chat     - currently on chemo     - continue follow up with Dr. Marin Olp  DVT prophylaxis: lovenox  Code Status: FULL  Family Communication: w/ family at bedside  Consults called: Onco notified   Status is: Inpatient  Remains inpatient appropriate because:Inpatient level of care appropriate due to severity  of illness   Dispo: The patient is from: Home              Anticipated d/c is to: Home              Patient currently is not medically stable to d/c.   Difficult to place patient No  Jonnie Finner DO [facility]  If 7PM-7AM, please contact night-coverage [email or web address]  07/14/2020, 3:02 PM

## 2020-07-15 ENCOUNTER — Other Ambulatory Visit: Payer: Self-pay

## 2020-07-15 DIAGNOSIS — U099 Post covid-19 condition, unspecified: Secondary | ICD-10-CM | POA: Diagnosis not present

## 2020-07-15 DIAGNOSIS — Z930 Tracheostomy status: Secondary | ICD-10-CM

## 2020-07-15 DIAGNOSIS — J189 Pneumonia, unspecified organism: Secondary | ICD-10-CM | POA: Diagnosis not present

## 2020-07-15 DIAGNOSIS — J8410 Pulmonary fibrosis, unspecified: Secondary | ICD-10-CM | POA: Diagnosis not present

## 2020-07-15 DIAGNOSIS — D649 Anemia, unspecified: Secondary | ICD-10-CM | POA: Diagnosis not present

## 2020-07-15 DIAGNOSIS — E119 Type 2 diabetes mellitus without complications: Secondary | ICD-10-CM | POA: Diagnosis not present

## 2020-07-15 DIAGNOSIS — L89152 Pressure ulcer of sacral region, stage 2: Secondary | ICD-10-CM

## 2020-07-15 DIAGNOSIS — F419 Anxiety disorder, unspecified: Secondary | ICD-10-CM

## 2020-07-15 DIAGNOSIS — J479 Bronchiectasis, uncomplicated: Secondary | ICD-10-CM

## 2020-07-15 DIAGNOSIS — J9611 Chronic respiratory failure with hypoxia: Secondary | ICD-10-CM | POA: Diagnosis not present

## 2020-07-15 DIAGNOSIS — C8112 Nodular sclerosis classical Hodgkin lymphoma, intrathoracic lymph nodes: Secondary | ICD-10-CM

## 2020-07-15 DIAGNOSIS — Z79899 Other long term (current) drug therapy: Secondary | ICD-10-CM

## 2020-07-15 LAB — COMPREHENSIVE METABOLIC PANEL
ALT: 18 U/L (ref 0–44)
AST: 19 U/L (ref 15–41)
Albumin: 2.5 g/dL — ABNORMAL LOW (ref 3.5–5.0)
Alkaline Phosphatase: 51 U/L (ref 38–126)
Anion gap: 7 (ref 5–15)
BUN: 41 mg/dL — ABNORMAL HIGH (ref 8–23)
CO2: 35 mmol/L — ABNORMAL HIGH (ref 22–32)
Calcium: 9.2 mg/dL (ref 8.9–10.3)
Chloride: 99 mmol/L (ref 98–111)
Creatinine, Ser: 0.71 mg/dL (ref 0.61–1.24)
GFR, Estimated: >60 mL/min (ref >60)
Glucose, Bld: 141 mg/dL — ABNORMAL HIGH (ref 70–99)
Potassium: 4.1 mmol/L (ref 3.5–5.1)
Sodium: 141 mmol/L (ref 135–145)
Total Bilirubin: 0.6 mg/dL (ref 0.3–1.2)
Total Protein: 7.1 g/dL (ref 6.5–8.1)

## 2020-07-15 LAB — CBC
HCT: 28.4 % — ABNORMAL LOW (ref 39.0–52.0)
Hemoglobin: 8.5 g/dL — ABNORMAL LOW (ref 13.0–17.0)
MCH: 26.4 pg (ref 26.0–34.0)
MCHC: 29.9 g/dL — ABNORMAL LOW (ref 30.0–36.0)
MCV: 88.2 fL (ref 80.0–100.0)
Platelets: 210 10*3/uL (ref 150–400)
RBC: 3.22 MIL/uL — ABNORMAL LOW (ref 4.22–5.81)
RDW: 16 % — ABNORMAL HIGH (ref 11.5–15.5)
WBC: 16.4 10*3/uL — ABNORMAL HIGH (ref 4.0–10.5)
nRBC: 0 % (ref 0.0–0.2)

## 2020-07-15 LAB — HIV ANTIBODY (ROUTINE TESTING W REFLEX): HIV Screen 4th Generation wRfx: NONREACTIVE

## 2020-07-15 MED ORDER — METRONIDAZOLE IN NACL 5-0.79 MG/ML-% IV SOLN
500.0000 mg | Freq: Three times a day (TID) | INTRAVENOUS | Status: DC
  Administered 2020-07-15 – 2020-07-20 (×15): 500 mg via INTRAVENOUS
  Filled 2020-07-15 (×15): qty 100

## 2020-07-15 MED ORDER — SODIUM CHLORIDE 3 % IN NEBU
4.0000 mL | INHALATION_SOLUTION | Freq: Every day | RESPIRATORY_TRACT | Status: AC
  Administered 2020-07-15 – 2020-07-17 (×3): 4 mL via RESPIRATORY_TRACT
  Filled 2020-07-15 (×3): qty 4

## 2020-07-15 MED ORDER — CHLORHEXIDINE GLUCONATE CLOTH 2 % EX PADS
6.0000 | MEDICATED_PAD | Freq: Every day | CUTANEOUS | Status: DC
  Administered 2020-07-15 – 2020-07-19 (×5): 6 via TOPICAL

## 2020-07-15 NOTE — Progress Notes (Signed)
  Patient's MEWS SCORE still RED, no changes from previous status. Requires frequent suctioning and inner cannula change. Transferred patient to ICU/Stepdown. Report given to receiving RN.    07/15/20 0003  Assess: MEWS Score  Temp 99.1 F (37.3 C)  BP 104/66  Pulse Rate (!) 115  Resp (!) 28  SpO2 92 %  Assess: MEWS Score  MEWS Temp 0  MEWS Systolic 0  MEWS Pulse 2  MEWS RR 2  MEWS LOC 0  MEWS Score 4  MEWS Score Color Red  Assess: if the MEWS score is Yellow or Red  Were vital signs taken at a resting state? Yes  Focused Assessment No change from prior assessment  Early Detection of Sepsis Score *See Row Information* Low  MEWS guidelines implemented *See Row Information* No, previously red, continue vital signs every 4 hours  Treat  Pain Scale 0-10  Pain Score 0  Take Vital Signs  Increase Vital Sign Frequency  Red: Q 1hr X 4 then Q 4hr X 4, if remains red, continue Q 4hrs  Document  Patient Outcome Transferred/level of care increased  Progress note created (see row info) Yes

## 2020-07-15 NOTE — Progress Notes (Signed)
PROGRESS NOTE  Steve [name] XLK:[id number] DOB: 11/05/1955 DOA: 07/14/2020 PCP: [name], PA-C   LOS: 1 day   Brief narrative:  Steve [name] is a 65 y.o. male with medical history significant of chronic hypoxic respiratory failure w/ chronic trach and O2 use, bronchiectasis, pulmonary fibrosis, anxiety. Presenting with dyspnea. Family reports that he had some problems w/ increase cough over the past week. It was productive of yellow sputum, but he was doing ok. He was able to complete his chemo session without incident. However, over the weekend, his cough worsened. His sputum turned green and became more viscous. He was having increased difficulty breathing. He seemed to be under a good amount of distress this morning. So, he was brought to the ED for assistance. He denies any other aggravating or alleviating factors.    ED Course: CXR show infiltrates superimposed on his chronic fibrosis. He was started on vanc and cefepime. RT suctioned his trach. TRH was called for admission.   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  Continue vancomycin and cefepime.  Follow urinary antigen for Legionella and strep.  Continue bronchodilators guaifenesin respiratory for trach care.  Currently on 10 L of oxygen by mask.  Saturating 93%.  Chronic respiratory failure on chronic O2 and trach Continue trach care.  RT on board.  Albuterol inhaler.  Continue antibiotics.  Frequent suctioning.  Chronic PEG      Continue nutrition through the PEG tube and orally.  Normocytic anemia Likely anemia of chronic disease.  We will continue to follow closely.  Anxiety On  Ativan.  Continue Zoloft trazodone   Diabetes mellitus type 2. Diet controlled at baseline.  Latest hemoglobin A1c on 05/25/2020 was 5.8.  Diabetic diet, Accu-Cheks.  GERD PPI   Hodgkin's lymphoma     Followed by Dr. Marin Olp as outpatient.  Seen by Dr. Marin Olp this morning  Pressure injury of the buttocks stage II.Marland Kitchen  Present on admission. Continue wound care.  Pressure Injury 07/14/20 Buttocks Medial Stage 2 -  Partial thickness loss of dermis presenting as a shallow open injury with a red, pink wound bed without slough. red, open skin (Active)  07/14/20 1700  Location: Buttocks  Location Orientation: Medial  Staging: Stage 2 -  Partial thickness loss of dermis presenting as a shallow open injury with a red, pink wound bed without slough.  Wound Description (Comments): red, open skin  Present on Admission: Yes     DVT prophylaxis: enoxaparin (LOVENOX) injection 40 mg Start: 07/14/20 2200    Code Status: Full code  Family Communication: None today  Status is: Inpatient  Remains inpatient appropriate because:IV treatments appropriate due to intensity of illness or inability to take PO and Inpatient level of care appropriate due to severity of illness   Dispo: The patient is from: Home              Anticipated d/c is to: Home with home health              Patient currently is not medically stable to d/c.   Difficult to place patient No  Consultants:  Hematooncology  Procedures:  None  Anti-infectives:  Marland Kitchen Vancomycin 4/5> .  cefepime 4/5>  Anti-infectives (From admission, onward)   Start     Dose/Rate Route Frequency Ordered Stop   07/15/20 0300  vancomycin (VANCOREADY) IVPB 750 mg/150 mL        750 mg 150 mL/hr over 60 Minutes Intravenous Every 12 hours 07/14/20 1613     07/14/20 2200  ceFEPIme (MAXIPIME) 2 g in sodium chloride 0.9 % 100 mL IVPB        2 g 200 mL/hr over 30 Minutes Intravenous Every 8 hours 07/14/20 1613     07/14/20 1400  vancomycin  (VANCOREADY) IVPB 1250 mg/250 mL        1,250 mg 166.7 mL/hr over 90 Minutes Intravenous STAT 07/14/20 1354 07/14/20 1627   07/14/20 1330  ceFEPIme (MAXIPIME) 2 g in sodium chloride 0.9 % 100 mL IVPB        2 g 200 mL/hr over 30 Minutes Intravenous  Once 07/14/20 1318 07/14/20 1440      Subjective: Today, patient was seen and examined at bedside.  Patient feels okay.  Has some cough with sputum production.  Required increased suctioning so was transferred to stepdown unit.  Denies any pain, fever or chills.  No nausea vomiting.  Objective: Vitals:   07/15/20 0803 07/15/20 0915  BP: (!) 96/49   Pulse: (!) 101   Resp: (!) 31   Temp: 98.3 F (36.8 C)   SpO2:  93%    Intake/Output Summary (Last 24 hours) at 07/15/2020 0920 Last data filed at 07/15/2020 2979 Gross per 24 hour  Intake 1865 ml  Output 600 ml  Net 1265 ml   Filed Weights   07/14/20 1352 07/15/20 0045  Weight: 62.1 kg 60.5 kg   Body mass index is 21.53 kg/m.   Physical Exam: GENERAL: Patient is alert awake and oriented. Not in obvious distress.  Appears ill, HENT: No scleral pallor or icterus. Pupils equally reactive to light. Oral mucosa is moist.  Tracheostomy in place with copious secretions. NECK: is supple, no gross swelling noted. CHEST: Coarse breath sounds noted, mild rhonchi heard. CVS: S1 and S2 heard, no murmur. Regular rate and rhythm.  ABDOMEN: Soft, non-tender, bowel sounds are present.  PEG tube in place. EXTREMITIES: No edema. CNS: Cranial nerves are intact. No focal motor deficits. SKIN: warm and dry without rashes.  Data Review: I have personally reviewed the following laboratory data and studies,  CBC: Recent Labs  Lab 07/10/20 1130 07/14/20 1233 07/15/20 0244  WBC 15.6* 16.3* 16.4*  NEUTROABS 11.0* 13.9*  --   HGB 9.5* 9.3* 8.5*  HCT 31.4* 31.1* 28.4*  MCV 86.3 87.6 88.2  PLT 247 234 892   Basic Metabolic Panel: Recent Labs  Lab 07/10/20 1130 07/14/20 1233 07/15/20 0244  NA  139 136 141  K 4.7 3.7 4.1  CL 90* 93* 99  CO2 46* 36* 35*  GLUCOSE 133* 148* 141*  BUN 40* 54* 41*  CREATININE 0.87 0.87 0.71  CALCIUM 10.7* 9.8 9.2   Liver Function Tests: Recent Labs  Lab 07/10/20 1130 07/14/20 1233 07/15/20 0244  AST 19 21 19   ALT 21 21 18   ALKPHOS 59 54 51  BILITOT 0.5 1.0 0.6  PROT 7.9 7.8 7.1  ALBUMIN 3.1* 2.8* 2.5*   No results for input(s): LIPASE, AMYLASE in the last 168 hours. No results for input(s): AMMONIA in the last 168 hours. Cardiac Enzymes: No results for input(s): CKTOTAL, CKMB, CKMBINDEX, TROPONINI in the last 168 hours. BNP (last 3 results) No results for input(s):  BNP in the last 8760 hours.  ProBNP (last 3 results) No results for input(s): PROBNP in the last 8760 hours.  CBG: No results for input(s): GLUCAP in the last 168 hours. Recent Results (from the past 240 hour(s))  Resp Panel by RT-PCR (Flu A&B, Covid) Nasopharyngeal Swab     Status: None   Collection Time: 07/14/20  2:58 PM   Specimen: Nasopharyngeal Swab; Nasopharyngeal(NP) swabs in vial transport medium  Result Value Ref Range Status   SARS Coronavirus 2 by RT PCR NEGATIVE NEGATIVE Final    Comment: (NOTE) SARS-CoV-2 target nucleic acids are NOT DETECTED.  The SARS-CoV-2 RNA is generally detectable in upper respiratory specimens during the acute phase of infection. The lowest concentration of SARS-CoV-2 viral copies this assay can detect is 138 copies/mL. A negative result does not preclude SARS-Cov-2 infection and should not be used as the sole basis for treatment or other patient management decisions. A negative result may occur with  improper specimen collection/handling, submission of specimen other than nasopharyngeal swab, presence of viral mutation(s) within the areas targeted by this assay, and inadequate number of viral copies(<138 copies/mL). A negative result must be combined with clinical observations, patient history, and  epidemiological information. The expected result is Negative.  Fact Sheet for Patients:  EntrepreneurPulse.com.au  Fact Sheet for Healthcare Providers:  IncredibleEmployment.be  This test is no t yet approved or cleared by the [place] FDA and  has been authorized for detection and/or diagnosis of SARS-CoV-2 by FDA under an Emergency Use Authorization (EUA). This EUA will remain  in effect (meaning this test can be used) for the duration of the COVID-19 declaration under Section 564(b)(1) of the Act, 21 U.S.C.section 360bbb-3(b)(1), unless the authorization is terminated  or revoked sooner.       Influenza A by PCR NEGATIVE NEGATIVE Final   Influenza B by PCR NEGATIVE NEGATIVE Final    Comment: (NOTE) The Xpert Xpress SARS-CoV-2/FLU/RSV plus assay is intended as an aid in the diagnosis of influenza from Nasopharyngeal swab specimens and should not be used as a sole basis for treatment. Nasal washings and aspirates are unacceptable for Xpert Xpress SARS-CoV-2/FLU/RSV testing.  Fact Sheet for Patients: EntrepreneurPulse.com.au  Fact Sheet for Healthcare Providers: IncredibleEmployment.be  This test is not yet approved or cleared by the [place] FDA and has been authorized for detection and/or diagnosis of SARS-CoV-2 by FDA under an Emergency Use Authorization (EUA). This EUA will remain in effect (meaning this test can be used) for the duration of the COVID-19 declaration under Section 564(b)(1) of the Act, 21 U.S.C. section 360bbb-3(b)(1), unless the authorization is terminated or revoked.  Performed at [facility], [place] [address], [place], [place] [address]   MRSA PCR Screening     Status: None   Collection Time: 07/14/20  6:29 PM   Specimen: Nasal Mucosa; Nasopharyngeal  Result Value Ref Range Status   MRSA by PCR NEGATIVE NEGATIVE Final    Comment:        The  GeneXpert MRSA Assay (FDA approved for NASAL specimens only), is one component of a comprehensive MRSA colonization surveillance program. It is not intended to diagnose MRSA infection nor to guide or monitor treatment for MRSA infections. Performed at [facility], [place] [address] [place], [place] [address]      Studies: DG Chest 2 View  Result Date: 07/14/2020 CLINICAL DATA:  Shortness of breath. EXAM: CHEST - 2 VIEW COMPARISON:  Chest x-ray 05/25/2020 FINDINGS: The tracheostomy tube  is in good position, unchanged. The power port is in good position, unchanged. Stable mild cardiac enlargement and prominent mediastinal and hilar contours. Severe chronic underlying lung disease likely post pneumonic pulmonary fibrosis. Low lung volumes with vascular crowding and atelectasis. Slight increase in patchy airspace opacity suspicious for superimposed infiltrates. No definite pleural effusions. IMPRESSION: Severe chronic underlying lung disease with suspected superimposed infiltrates. Electronically Signed   By: Marijo Sanes M.D.   On: 07/14/2020 14:13      Flora Lipps, MD  [facility] 07/15/2020  If 7PM-7AM, please contact night-coverage

## 2020-07-15 NOTE — Consult Note (Signed)
Referral MD  Reason for Referral: Pneumonia superimposed upon severe underlying pulmonary disease; relapsed Hodgkin's disease  Chief Complaint  Patient presents with  . Shortness of Breath  . Fever  : Patient cannot talk due to tracheotomy  HPI: Steve [name] is known to me.  He is a nice 65 year old white male.  He has a history of relapsed Hodgkin's disease.  He has severe underlying lung disease.  He developed this when he had COVID.  He recently was found to have relapsed Hodgkin's lymphoma.  He presented with Hodgkin's disease back in November 2020.  He only received partial therapy.  This is the patient's request.  He then was found to have relapsed disease.  He had a PET scan done back in November which showed bulky mediastinal adenopathy and supraclavicular adenopathy as well as probable skeletal disease.  We have treated him with 1 cycle of single agent Adcetris.  We have try to avoid immunotherapy because of his underlying lung problems.  He has been hospitalized on medication because of exacerbations of his pulmonary disease.  He now is admitted with pneumonia.  His labs this morning show white count 16.4.  Hemoglobin 8.5.  Platelet count 210,000.  His BUN is 41 creatinine 0.71.  Calcium is 9.2.  His chest x-ray when he came in showed a severe chronic underlying lung disease with suspected superimposed infiltrates.  He is currently on IV antibiotics with Maxipime and vancomycin.  Of note, Adcetris does not compromise his immunity.  He had tolerated the Adcetris well.  He received this last week.    Past Medical History:  Diagnosis Date  . Abscess of muscle 08/10/2011   staph infection of right hip   . Acute on chronic respiratory failure with hypoxia ([place])   . Acute respiratory distress syndrome (ARDS) due to COVID-19 virus ([place])   . Anxiety   . COVID-19 virus infection   . Diabetes mellitus without complication ([place])   . GERD (gastroesophageal reflux disease)     rare reflux - no meds for reflux - NO PROBLEM IN PAST SEVERAL YRS  . HCAP (healthcare-associated pneumonia)   . Healthcare-associated pneumonia   . Hip dysplasia, congenital    no surgery as a child for hip dysplasia - has had bilateral hip replacements as an adult  . Hodgkin lymphoma ([place])   . Hodgkin's lymphoma ([place])   . Hypertension   . Pancreatitis   . Pneumothorax, acute   . Postoperative anemia due to acute blood loss 09/07/2012  . Septic arthritis of hip ([place]) 09/05/2012   PT'S TOTAL HIP JOINT REMOVED - ANTIBIOTIC SPACE PLACED AND PT HAS FINISHED IV ANTIBIOTICS ( PICC LINE REMOVED)  . Sleep apnea    USES CPAP  :  Past Surgical History:  Procedure Laterality Date  . COLONOSCOPY  04/26/2007  . HERNIA REPAIR     inguinal hernia x3  . IR GASTROSTOMY TUBE MOD SED  08/22/2019  . IR IMAGING GUIDED PORT INSERTION  03/29/2019  . JOINT REPLACEMENT  2002 & 2007   bilateral hip replacement  . LYMPH NODE BIOPSY Right 03/20/2019   Procedure: DEEP RIGHT SUPRACLAVICULAR LYMPH NODE EXCISION;  Surgeon: Fanny Skates, MD;  Location: [facility];  Service: General;  Laterality: Right;  . MULTIPLE EXTRACTIONS WITH ALVEOLOPLASTY  07/28/2011   Procedure: MULTIPLE EXTRACION WITH ALVEOLOPLASTY;  Surgeon: Lenn Cal, DDS;  Location: [facility];  Service: Oral Surgery;  Laterality: N/A;  Extraction of tooth #'s 2,3,4,5,6,11,12,13,15,19,22 with alveoloplasty.  . shoulder  repair - right for separation of shoulder    . TEE WITHOUT CARDIOVERSION  07/29/2011   Procedure: TRANSESOPHAGEAL ECHOCARDIOGRAM (TEE);  Surgeon: Josue Hector, MD;  Location: [facility];  Service: Cardiovascular;  Laterality: N/A;  . TOTAL HIP REVISION Right 09/05/2012   Procedure: RIGHT HIP RESECTION ARTHROPLASTY WITH ANTIBIOTIC SPACERS;  Surgeon: [name], MD;  Location: [facility];  Service: Orthopedics;  Laterality: Right;  . TOTAL HIP REVISION Right 11/30/2012   Procedure: RIGHT TOTAL HIP ARTHROPLASTY  REIMPLANTATION;  Surgeon: [name], MD;  Location: [facility];  Service: Orthopedics;  Laterality: Right;  :   Current Facility-Administered Medications:  .  albuterol (PROVENTIL) (2.5 MG/3ML) 0.083% nebulizer solution 2.5 mg, 2.5 mg, Nebulization, Q2H PRN, Kyle, Tyrone A, DO, 2.5 mg at 07/14/20 1815 .  ceFEPIme (MAXIPIME) 2 g in sodium chloride 0.9 % 100 mL IVPB, 2 g, Intravenous, Q8H, Pham, Anh P, RPH, Stopped at 07/15/20 0102 .  chlorhexidine (PERIDEX) 0.12 % solution 15 mL, 15 mL, Mouth Rinse, BID PRN, Marylyn Ishihara, Tyrone A, DO .  Chlorhexidine Gluconate Cloth 2 % PADS 6 each, 6 each, Topical, Daily, Kyle, Tyrone A, DO .  cholecalciferol (VITAMIN D3) tablet 400 Units, 400 Units, Per Tube, Daily, Kyle, Tyrone A, DO .  enoxaparin (LOVENOX) injection 40 mg, 40 mg, Subcutaneous, Q24H, Kyle, Tyrone A, DO, 40 mg at 07/14/20 2122 .  feeding supplement (OSMOLITE 1.2 CAL) liquid 1,000 mL, 1,000 mL, Per Tube, Continuous, Kyle, Tyrone A, DO, Last Rate: 70 mL/hr at 07/15/20 0030, 1,000 mL at 07/15/20 0105 .  feeding supplement (PROSource TF) liquid 30 mL, 30 mL, Per Tube, Daily, Kyle, Tyrone A, DO .  free water 120 mL, 120 mL, Per Tube, Q4H, Kyle, Tyrone A, DO, 120 mL at 07/15/20 0401 .  furosemide (LASIX) tablet 20 mg, 20 mg, Per Tube, Daily, Kyle, Tyrone A, DO .  guaiFENesin (ROBITUSSIN) 100 MG/5ML solution 100 mg, 5 mL, Per Tube, Q4H PRN, Marylyn Ishihara, Tyrone A, DO, 100 mg at 07/14/20 1815 .  lidocaine-prilocaine (EMLA) cream 1 application, 1 application, Topical, Daily PRN, Marylyn Ishihara, Tyrone A, DO .  LORazepam (ATIVAN) tablet 0.5 mg, 0.5 mg, Oral, Q6H PRN, Marylyn Ishihara, Tyrone A, DO .  multivitamin liquid 15 mL, 15 mL, Per Tube, Daily, Kyle, Tyrone A, DO .  ondansetron (ZOFRAN) tablet 4 mg, 4 mg, Oral, Q6H PRN **OR** ondansetron (ZOFRAN) injection 4 mg, 4 mg, Intravenous, Q6H PRN, Marylyn Ishihara, Tyrone A, DO .  pantoprazole sodium (PROTONIX) 40 mg/20 mL oral suspension 40 mg, 40 mg, Per Tube, Q1200, Kyle, Tyrone A, DO .  sertraline  (ZOLOFT) tablet 50 mg, 50 mg, Per Tube, Daily, Kyle, Tyrone A, DO .  traZODone (DESYREL) tablet 100 mg, 100 mg, Per Tube, QHS, Kyle, Tyrone A, DO, 100 mg at 07/14/20 2122 .  vancomycin (VANCOREADY) IVPB 750 mg/150 mL, 750 mg, Intravenous, Q12H, Pham, Anh P, RPH, Stopped at 07/15/20 0501:  . Chlorhexidine Gluconate Cloth  6 each Topical Daily  . cholecalciferol  400 Units Per Tube Daily  . enoxaparin (LOVENOX) injection  40 mg Subcutaneous Q24H  . feeding supplement (PROSource TF)  30 mL Per Tube Daily  . free water  120 mL Per Tube Q4H  . furosemide  20 mg Per Tube Daily  . multivitamin  15 mL Per Tube Daily  . pantoprazole sodium  40 mg Per Tube Q1200  . sertraline  50 mg Per Tube Daily  . traZODone  100 mg Per Tube QHS  :  No Known  Allergies:  Family History  Problem Relation Age of Onset  . Melanoma Mother   . Heart attack Mother   . Hyperlipidemia Neg Hx   . Sudden death Neg Hx   . Hypertension Neg Hx   . Diabetes Neg Hx   :  Social History   Socioeconomic History  . Marital status: Married    Spouse name: Not on file  . Number of children: Not on file  . Years of education: Not on file  . Highest education level: Not on file  Occupational History  . Not on file  Tobacco Use  . Smoking status: Never Smoker  . Smokeless tobacco: Never Used  Vaping Use  . Vaping Use: Never used  Substance and Sexual Activity  . Alcohol use: Yes    Comment: rare beer. 5 times a year at most. 2 beers when he drinks.  . Drug use: No  . Sexual activity: Yes    Partners: Female  Other Topics Concern  . 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   Welder/fabricator   2 caffeinated beverages daily   Social Determinants of Health   Financial Resource Strain: Not on file  Food Insecurity: Not on file  Transportation Needs: Not on file  Physical Activity: Not on file  Stress: Not on file  Social Connections: Not on file  Intimate Partner Violence: Not on  file  :  Pertinent items are noted in HPI.  Exam: As above Patient Vitals for the past 24 hrs:  BP Temp Temp src Pulse Resp SpO2 Height Weight  07/15/20 0620 (!) 97/55 -- -- -- -- -- -- --  07/15/20 0600 -- -- -- 85 (!) 36 98 % -- --  07/15/20 0500 (!) 87/46 -- -- 93 (!) 33 96 % -- --  07/15/20 0400 (!) 98/50 98.5 F (36.9 C) Oral 96 (!) 26 95 % -- --  07/15/20 0300 (!) 90/51 -- -- 99 (!) 27 98 % -- --  07/15/20 0200 (!) 105/50 -- -- 92 (!) 22 100 % -- --  07/15/20 0145 -- -- -- 93 20 99 % -- --  07/15/20 0130 (!) 85/51 -- -- 95 (!) 25 99 % -- --  07/15/20 0115 (!) 89/55 -- -- (!) 102 (!) 22 95 % -- --  07/15/20 0100 (!) 95/56 -- -- (!) 113 20 96 % -- --  07/15/20 0045 (!) 96/56 -- -- (!) 109 17 95 % -- 133 lb 6.1 oz (60.5 kg)  07/15/20 0035 -- 99.3 F (37.4 C) Oral -- -- -- -- --  07/15/20 0030 -- -- -- (!) 122 (!) 28 (!) 83 % -- --  07/15/20 0003 104/66 99.1 F (37.3 C) -- (!) 115 (!) 28 92 % -- --  07/14/20 2303 98/60 98.4 F (36.9 C) Oral (!) 107 (!) 28 95 % -- --  07/14/20 2253 -- -- -- (!) 108 (!) 26 91 % -- --  07/14/20 2144 98/63 98.7 F (37.1 C) -- (!) 101 (!) 21 96 % -- --  07/14/20 1947 93/62 98.8 F (37.1 C) Oral 89 (!) 21 96 % -- --  07/14/20 1936 -- -- -- 95 (!) 22 93 % -- --  07/14/20 1645 -- -- -- 94 -- 100 % -- --  07/14/20 1644 92/69 97.9 F (36.6 C) Oral 93 20 100 % -- --  07/14/20 1500 110/73 98.3 F (36.8 C) Oral 95 (!) 27 100 % -- --  07/14/20 1445 -- -- -- 94 -- 95 % -- --  07/14/20 1430 120/67 -- -- (!) 107 -- 94 % -- --  07/14/20 1415 -- -- -- (!) 102 -- 96 % -- --  07/14/20 1400 106/80 -- -- (!) 105 (!) 22 100 % -- --  07/14/20 1352 -- -- -- -- -- -- 5\' 6"  (1.676 m) 137 lb (62.1 kg)  07/14/20 1345 -- -- -- 98 -- 99 % -- --  07/14/20 1330 104/71 -- -- 100 (!) 24 98 % -- --  07/14/20 1315 -- -- -- 94 -- 99 % -- --  07/14/20 1300 (!) 92/59 -- -- 94 -- 96 % -- --  07/14/20 1245 -- -- -- 95 -- 96 % -- --  07/14/20 1230 93/63 -- -- 100 (!) 24 97 %  -- --  07/14/20 1215 -- -- -- (!) 104 -- 94 % -- --  07/14/20 1200 105/69 -- -- (!) 107 -- 92 % -- --  07/14/20 1145 (!) 163/135 -- -- (!) 105 -- 94 % -- --  07/14/20 1143 (!) 163/135 98.8 F (37.1 C) -- (!) 109 (!) 26 92 % -- --     Recent Labs    07/14/20 1233 07/15/20 0244  WBC 16.3* 16.4*  HGB 9.3* 8.5*  HCT 31.1* 28.4*  PLT 234 210   Recent Labs    07/14/20 1233 07/15/20 0244  NA 136 141  K 3.7 4.1  CL 93* 99  CO2 36* 35*  GLUCOSE 148* 141*  BUN 54* 41*  CREATININE 0.87 0.71  CALCIUM 9.8 9.2    Blood smear review: None  Pathology: None    Assessment and Plan: Steve [name] is a 65 year old white male.  He has severe pulmonary disease.  He has had several admissions because of exacerbations of his lung disease.  Now looks like he has pneumonia.  He has had 1 cycle of therapy with Adcetris.  Again this is not chemotherapy.  This will not compromise his immune system.  Unfortunately he just has had significant risk for exacerbations of his underlying lung disease.  We will follow him along.  I know he will get fantastic care from all staff down in the ICU.  Lattie Haw, MD  Darlyn Chamber 17:14

## 2020-07-16 DIAGNOSIS — J9611 Chronic respiratory failure with hypoxia: Secondary | ICD-10-CM | POA: Diagnosis not present

## 2020-07-16 DIAGNOSIS — E119 Type 2 diabetes mellitus without complications: Secondary | ICD-10-CM | POA: Diagnosis not present

## 2020-07-16 DIAGNOSIS — J189 Pneumonia, unspecified organism: Secondary | ICD-10-CM | POA: Diagnosis not present

## 2020-07-16 DIAGNOSIS — J479 Bronchiectasis, uncomplicated: Secondary | ICD-10-CM | POA: Diagnosis not present

## 2020-07-16 LAB — COMPREHENSIVE METABOLIC PANEL
ALT: 18 U/L (ref 0–44)
AST: 19 U/L (ref 15–41)
Albumin: 2.3 g/dL — ABNORMAL LOW (ref 3.5–5.0)
Alkaline Phosphatase: 46 U/L (ref 38–126)
Anion gap: 7 (ref 5–15)
BUN: 29 mg/dL — ABNORMAL HIGH (ref 8–23)
CO2: 34 mmol/L — ABNORMAL HIGH (ref 22–32)
Calcium: 9.1 mg/dL (ref 8.9–10.3)
Chloride: 98 mmol/L (ref 98–111)
Creatinine, Ser: 0.75 mg/dL (ref 0.61–1.24)
GFR, Estimated: >60 mL/min (ref >60)
Glucose, Bld: 158 mg/dL — ABNORMAL HIGH (ref 70–99)
Potassium: 4.1 mmol/L (ref 3.5–5.1)
Sodium: 139 mmol/L (ref 135–145)
Total Bilirubin: 0.5 mg/dL (ref 0.3–1.2)
Total Protein: 6.6 g/dL (ref 6.5–8.1)

## 2020-07-16 LAB — CBC
HCT: 28.3 % — ABNORMAL LOW (ref 39.0–52.0)
Hemoglobin: 8.3 g/dL — ABNORMAL LOW (ref 13.0–17.0)
MCH: 26.5 pg (ref 26.0–34.0)
MCHC: 29.3 g/dL — ABNORMAL LOW (ref 30.0–36.0)
MCV: 90.4 fL (ref 80.0–100.0)
Platelets: 213 10*3/uL (ref 150–400)
RBC: 3.13 MIL/uL — ABNORMAL LOW (ref 4.22–5.81)
RDW: 16.1 % — ABNORMAL HIGH (ref 11.5–15.5)
WBC: 12.7 10*3/uL — ABNORMAL HIGH (ref 4.0–10.5)
nRBC: 0 % (ref 0.0–0.2)

## 2020-07-16 LAB — PHOSPHORUS: Phosphorus: 2.8 mg/dL (ref 2.5–4.6)

## 2020-07-16 LAB — MAGNESIUM: Magnesium: 1.7 mg/dL (ref 1.7–2.4)

## 2020-07-16 MED ORDER — SODIUM CHLORIDE 0.9% FLUSH
10.0000 mL | Freq: Two times a day (BID) | INTRAVENOUS | Status: DC
  Administered 2020-07-18 – 2020-07-20 (×4): 10 mL

## 2020-07-16 MED ORDER — CALAMINE EX LOTN
TOPICAL_LOTION | CUTANEOUS | Status: DC | PRN
  Filled 2020-07-16 (×2): qty 118

## 2020-07-16 MED ORDER — SODIUM CHLORIDE 0.9% FLUSH: 10.0000 mL | INTRAVENOUS | Status: DC | PRN

## 2020-07-16 MED ORDER — ORAL CARE MOUTH RINSE
15.0000 mL | Freq: Two times a day (BID) | OROMUCOSAL | Status: DC
  Administered 2020-07-16 – 2020-07-20 (×8): 15 mL via OROMUCOSAL

## 2020-07-16 MED ORDER — SODIUM CHLORIDE 0.9% FLUSH
10.0000 mL | Freq: Two times a day (BID) | INTRAVENOUS | Status: DC
  Administered 2020-07-16 – 2020-07-18 (×2): 10 mL

## 2020-07-16 NOTE — TOC Initial Note (Signed)
Transition of Care [facility]) - Initial/Assessment Note    Patient Details  Name: Steve [name] MRN: [id number] Date of Birth: 1955/05/04  [facility]) CM/SW Contact:    Leeroy Cha, RN Phone Number: 07/16/2020, 8:08 AM  Clinical Narrative:                 65 y.o.malewith medical history significant ofchronic hypoxic respiratory failure w/ chronic trach and O2 use, bronchiectasis, pulmonary fibrosis, anxiety. Presenting with dyspnea. Family reports that he had some problems w/ increase cough over the past week. It was productive of yellow sputum, but he was doing ok. He was able to complete his chemo session without incident. However, over the weekend, his cough worsened. His sputum turned green and became more viscous. He was having increased difficulty breathing. He seemed to be under a good amount of distress this morning. So, he was brought to the ED for assistance. He denies any other aggravating or alleviating factors.  ED Course:CXR show infiltrates superimposed on his chronic fibrosis. He was started on vanc and cefepime. RT suctioned his trach. PLAN: lives with wife, has had hhc coming to home,plan is to return to the home. Expected Discharge Plan: Home/Self Care Barriers to Discharge: Continued Medical Work up   Patient Goals and CMS Choice Patient states their goals for this hospitalization and ongoing recovery are:: to go home CMS Medicare.gov Compare Post Acute Care list provided to:: Patient    Expected Discharge Plan and Services Expected Discharge Plan: Home/Self Care   Discharge Planning Services: CM Consult   Living arrangements for the past 2 months: Mobile Home                                      Prior Living Arrangements/Services Living arrangements for the past 2 months: Mobile Home Lives with:: Spouse Patient language and need for interpreter reviewed:: Yes Do you feel safe going back to the place where you live?: Yes       Need for Family Participation in Patient Care: Yes (Comment) Care giver support system in place?: Yes (comment) Current home services: DME,Home OT,Home PT,Home RN Criminal Activity/Legal Involvement Pertinent to Current Situation/Hospitalization: No - Comment as needed  Activities of Daily Living Home Assistive Devices/Equipment: Vent/Trach supplies,Hospital bed,Oxygen,Nebulizer (kangaroo pump, pulse oximeter,afflo vest) ADL Screening (condition at time of admission) Patient's cognitive ability adequate to safely complete daily activities?: Yes Is the patient deaf or have difficulty hearing?: No Does the patient have difficulty seeing, even when wearing glasses/contacts?: No Does the patient have difficulty concentrating, remembering, or making decisions?: No Patient able to express need for assistance with ADLs?: No Does the patient have difficulty dressing or bathing?: Yes Independently performs ADLs?: No Communication: Needs assistance Is this a change from baseline?: Pre-admission baseline Dressing (OT): Needs assistance Is this a change from baseline?: Pre-admission baseline Grooming: Needs assistance Is this a change from baseline?: Pre-admission baseline Feeding: Needs assistance Is this a change from baseline?: Pre-admission baseline Bathing: Needs assistance Is this a change from baseline?: Pre-admission baseline Toileting: Needs assistance Is this a change from baseline?: Pre-admission baseline In/Out Bed: Needs assistance Is this a change from baseline?: Pre-admission baseline Walks in Home: Dependent Is this a change from baseline?: Pre-admission baseline Does the patient have difficulty walking or climbing stairs?: Yes Weakness of Legs: Both Weakness of Arms/Hands: Both  Permission Sought/Granted  Emotional Assessment Appearance:: Appears stated age Attitude/Demeanor/Rapport: Engaged Affect (typically observed): Calm Orientation: :  Oriented to Place,Oriented to Self,Oriented to  Time,Oriented to Situation Alcohol / Substance Use: Not Applicable Psych Involvement: No (comment)  Admission diagnosis:  Pneumonia [J18.9] Tracheostomy present ([place]) [Z93.0] Pneumonia of both lungs due to infectious organism, unspecified part of lung [J18.9] Patient Active Problem List   Diagnosis Date Noted  . DM (diabetes mellitus), type 2 ([place]) 07/15/2020  . Pneumonia 07/14/2020  . Pulmonary fibrosis ([place]) 05/26/2020  . Bronchiectasis ([place]) 05/26/2020  . Acute respiratory failure with hypoxia ([place]) 05/26/2020  . Acute on chronic respiratory failure with hypoxia ([place]) 05/25/2020  . Uncontrolled type 2 diabetes mellitus with hyperglycemia, with long-term current use of insulin ([place]) 05/25/2020  . Essential hypertension 05/25/2020  . GERD without esophagitis 05/25/2020  . Debility   . Anxiety   . Tracheostomy care ([place])   . Pneumothorax on right 03/23/2020  . Chronic respiratory failure with hypoxia ([place])   . ARDS (adult respiratory distress syndrome) ([place])   . Hodgkin's lymphoma ([place])   . Pneumothorax, acute   . Healthcare-associated pneumonia   . COVID-19 virus infection   . On mechanically assisted ventilation ([place])   . Dysphagia   . Pneumothorax   . Subcutaneous air ([place])   . Tracheostomy dependence ([place])   . Pneumomediastinum ([place])   . Acute respiratory distress syndrome (ARDS) due to COVID-19 virus ([place])   . Pressure injury of skin 07/27/2019  . Acute respiratory failure with hypoxemia ([place])   . 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 Malnutrition of moderate degree 07/10/2019  . HCAP (healthcare-associated pneumonia)   . Hypoxia   . Febrile neutropenia ([place]) 05/17/2019  . COVID-19 05/17/2019  . Anemia 05/17/2019  . Hyponatremia 05/17/2019  . Protein-calorie malnutrition, severe ([place]) 05/17/2019  . Neutropenic fever ([place]) 05/16/2019  . 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  Chemotherapy-induced diarrhea 04/22/2019  . Anemia due to antineoplastic chemotherapy 03/25/2019  . Hodgkin's lymphoma ([place]) 02/25/2019  . Abnormal CT scan, pancreas - tail area 01/31/2013  . Prosthetic joint infection ([place]) 09/20/2012  . Septic arthritis of hip ([place]) 09/05/2012  . Routine general medical examination at a health care facility 09/12/2011  . Elbow pain 09/12/2011  . Abscess of muscle 08/10/2011  . H/O dental abscess 08/10/2011  . Staphylococcus aureus bacteremia 07/27/2011  . Abdominal pain, RLQ 09/02/2010  . HYPOGONADISM, MALE 03/23/2007  . SLEEP APNEA 03/23/2007   PCP:  [name], PA-C Pharmacy:   [id number] [place], [address] [place] [address] Phone: [phone] Fax: [phone]     Social Determinants of Health (SDOH) Interventions    Readmission Risk Interventions Readmission Risk Prevention Plan 07/22/2019  Transportation Screening Complete  Medication Review (RN Care Manager) Complete  Some recent data might be hidden

## 2020-07-16 NOTE — Progress Notes (Signed)
PROGRESS NOTE  HAVIER DEEB ZDG:[id number] DOB: 1955-12-22 DOA: 07/14/2020 PCP: [name], PA-C   LOS: 2 days   Brief narrative:  Steve [name] is a 65 y.o. male with medical history significant of chronic hypoxic respiratory failure with chronic trach and O2 use, bronchiectasis, pulmonary fibrosis, anxiety presented to hospital with dyspnea and increased cough and productive yellow sputum.  In the ED, chest x-ray showed infiltrates superimposed on his chronic fibrosis. He was started on vanc and cefepime. RT suctioned his trach.  Patient was then admitted to the hospital for further evaluation and treatment.  Required extensive suctioning so was transferred to stepdown unit.  Assessment/Plan:  Principal Problem:   Pneumonia Active Problems:   Hodgkin's lymphoma ([place])   Pressure injury of skin   Tracheostomy dependence ([name])   Chronic respiratory failure with hypoxia ([name])   Hodgkin's lymphoma ([place])   Bronchiectasis ([name])   DM (diabetes mellitus), type 2 ([place])  Bilateral superimposed pneumonia with underlying pulmonary fibrosis and bronchiectasis.  Patient was initially on vancomycin and cefepime.  MRSA screen negative.  We will continue cefepime and Flagyl for now due to possibility of anaerobic organisms.  Respiratory panel was negative for influenza and Covid.  Blood cultures negative in 1 day.  Follow urinary antigen for Legionella and strep-pending..  Continue bronchodilators, guaifenesin, respiratory for trach care.  Currently on 10 L of oxygen by mask at 40% FiO2.  Saturating 96%.  Chronic respiratory failure on chronic O2 and trach. Continue trach care.  RT on board.  Continue frequent suctioning, albuterol inhaler.  Continue antibiotics.   Chronic PEG      Continue nutrition through the PEG tube and orally.  Normocytic anemia Likely anemia of chronic disease.  We will continue to follow closely.  Level of 8.3.  Anxiety On Ativan.  Continue Zoloft,  trazodone   Diabetes mellitus type 2. Diet controlled at baseline.  Latest hemoglobin A1c on 05/25/2020 was 5.8.  Continue diabetic diet, Accu-Cheks.  GERD Continue PPI   Hodgkin's lymphoma     Followed by Dr. Marin Olp as outpatient.  Seen by oncology during hospitalization.  Pressure injury of the buttocks stage II.Marland Kitchen  Present on admission. Continue wound care.  Pressure Injury 07/14/20 Buttocks Medial Stage 2 -  Partial thickness loss of dermis presenting as a shallow open injury with a red, pink wound bed without slough. red, open skin (Active)  07/14/20 1700  Location: Buttocks  Location Orientation: Medial  Staging: Stage 2 -  Partial thickness loss of dermis presenting as a shallow open injury with a red, pink wound bed without slough.  Wound Description (Comments): red, open skin  Present on Admission: Yes    DVT prophylaxis: enoxaparin (LOVENOX) injection 40 mg Start: 07/14/20 2200  Code Status: Full code  Family Communication:   Spoke with the patient's sisters at bedside.  Status is: Inpatient  Remains inpatient appropriate because:IV treatments appropriate due to intensity of illness or inability to take PO and Inpatient level of care appropriate due to severity of illness, IV antibiotics, frequent tracheal suctioning.   Dispo: The patient is from: Home              Anticipated d/c is to: Home with home health in 1 to 2 days              Patient currently is not medically stable to d/c.   Difficult to place patient No  Consultants:  Hemato-oncology  Procedures:  None  Anti-infectives:  Marland Kitchen Vancomycin  4/5>4/6 .  cefepime 4/5> . Metronidazole 4/6>  Anti-infectives (From admission, onward)   Start     Dose/Rate Route Frequency Ordered Stop   07/15/20 1200  metroNIDAZOLE (FLAGYL) IVPB 500 mg        500 mg 100 mL/hr over 60 Minutes Intravenous Every 8 hours 07/15/20 1024     07/15/20 0300  vancomycin (VANCOREADY) IVPB 750 mg/150 mL  Status:  Discontinued         750 mg 150 mL/hr over 60 Minutes Intravenous Every 12 hours 07/14/20 1613 07/15/20 1025   07/14/20 2200  ceFEPIme (MAXIPIME) 2 g in sodium chloride 0.9 % 100 mL IVPB        2 g 200 mL/hr over 30 Minutes Intravenous Every 8 hours 07/14/20 1613     07/14/20 1400  vancomycin (VANCOREADY) IVPB 1250 mg/250 mL        1,250 mg 166.7 mL/hr over 90 Minutes Intravenous STAT 07/14/20 1354 07/14/20 1627   07/14/20 1330  ceFEPIme (MAXIPIME) 2 g in sodium chloride 0.9 % 100 mL IVPB        2 g 200 mL/hr over 30 Minutes Intravenous  Once 07/14/20 1318 07/14/20 1440      Subjective: Today, patient was seen and examined at bedside.  Denies any chest pain, nausea, vomiting, fever or chills.  Still has cough with sputum production and requiring suctioning.  Sisters  at bedside.  Objective: Vitals:   07/16/20 0800 07/16/20 0803  BP: (!) 91/49   Pulse: 92   Resp: 12   Temp: 98 F (36.7 C)   SpO2: 92% 96%    Intake/Output Summary (Last 24 hours) at 07/16/2020 0839 Last data filed at 07/15/2020 2212 Gross per 24 hour  Intake 1352.11 ml  Output 1450 ml  Net -97.89 ml   Filed Weights   07/14/20 1352 07/15/20 0045  Weight: 62.1 kg 60.5 kg   Body mass index is 21.53 kg/m.   Physical Exam:  GENERAL: Patient is alert awake and oriented. Not in obvious distress.  On trach collar at 10 L/min, chronically ill.  Thinly built. HENT: No scleral pallor or icterus. Pupils equally reactive to light. Oral mucosa is moist.  Tracheostomy in place  NECK: is supple, no gross swelling noted. CHEST: Decreased breath sounds bilaterally, coarse breath sounds. CVS: S1 and S2 heard, no murmur. Regular rate and rhythm.  ABDOMEN: Soft, non-tender, bowel sounds are present.  PEG tube in place without induration, erythema.Marland Kitchen EXTREMITIES: No edema. CNS: Cranial nerves are intact. No focal motor deficits.  Moves all extremities. SKIN: warm and dry without rashes.  Data Review: I have personally reviewed the following  laboratory data and studies,  CBC: Recent Labs  Lab 07/10/20 1130 07/14/20 1233 07/15/20 0244 07/16/20 0249  WBC 15.6* 16.3* 16.4* 12.7*  NEUTROABS 11.0* 13.9*  --   --   HGB 9.5* 9.3* 8.5* 8.3*  HCT 31.4* 31.1* 28.4* 28.3*  MCV 86.3 87.6 88.2 90.4  PLT 247 234 210 130   Basic Metabolic Panel: Recent Labs  Lab 07/10/20 1130 07/14/20 1233 07/15/20 0244 07/16/20 0249  NA 139 136 141 139  K 4.7 3.7 4.1 4.1  CL 90* 93* 99 98  CO2 46* 36* 35* 34*  GLUCOSE 133* 148* 141* 158*  BUN 40* 54* 41* 29*  CREATININE 0.87 0.87 0.71 0.75  CALCIUM 10.7* 9.8 9.2 9.1  MG  --   --   --  1.7  PHOS  --   --   --  2.8   Liver Function Tests: Recent Labs  Lab 07/10/20 1130 07/14/20 1233 07/15/20 0244 07/16/20 0249  AST 19 21 19 19   ALT 21 21 18 18   ALKPHOS 59 54 51 46  BILITOT 0.5 1.0 0.6 0.5  PROT 7.9 7.8 7.1 6.6  ALBUMIN 3.1* 2.8* 2.5* 2.3*   No results for input(s): LIPASE, AMYLASE in the last 168 hours. No results for input(s): AMMONIA in the last 168 hours. Cardiac Enzymes: No results for input(s): CKTOTAL, CKMB, CKMBINDEX, TROPONINI in the last 168 hours. BNP (last 3 results) No results for input(s): BNP in the last 8760 hours.  ProBNP (last 3 results) No results for input(s): PROBNP in the last 8760 hours.  CBG: No results for input(s): GLUCAP in the last 168 hours. Recent Results (from the past 240 hour(s))  Blood culture (routine x 2)     Status: None (Preliminary result)   Collection Time: 07/14/20 12:33 PM   Specimen: BLOOD  Result Value Ref Range Status   Specimen Description   Final    BLOOD LEFT ANTECUBITAL Performed at [facility] [address] [place], [place] [address]    Special Requests   Final    BOTTLES DRAWN AEROBIC AND ANAEROBIC Blood Culture adequate volume Performed at [facility] [address], [place], [place] [address]    Culture   Final    NO GROWTH 1 DAY Performed at [facility],  [place] [address], [place], [place] [address]    Report Status PENDING  Incomplete  Resp Panel by RT-PCR (Flu A&B, Covid) Nasopharyngeal Swab     Status: None   Collection Time: 07/14/20  2:58 PM   Specimen: Nasopharyngeal Swab; Nasopharyngeal(NP) swabs in vial transport medium  Result Value Ref Range Status   SARS Coronavirus 2 by RT PCR NEGATIVE NEGATIVE Final    Comment: (NOTE) SARS-CoV-2 target nucleic acids are NOT DETECTED.  The SARS-CoV-2 RNA is generally detectable in upper respiratory specimens during the acute phase of infection. The lowest concentration of SARS-CoV-2 viral copies this assay can detect is 138 copies/mL. A negative result does not preclude SARS-Cov-2 infection and should not be used as the sole basis for treatment or other patient management decisions. A negative result may occur with  improper specimen collection/handling, submission of specimen other than nasopharyngeal swab, presence of viral mutation(s) within the areas targeted by this assay, and inadequate number of viral copies(<138 copies/mL). A negative result must be combined with clinical observations, patient history, and epidemiological information. The expected result is Negative.  Fact Sheet for Patients:  EntrepreneurPulse.com.au  Fact Sheet for Healthcare Providers:  IncredibleEmployment.be  This test is no t yet approved or cleared by the [place] FDA and  has been authorized for detection and/or diagnosis of SARS-CoV-2 by FDA under an Emergency Use Authorization (EUA). This EUA will remain  in effect (meaning this test can be used) for the duration of the COVID-19 declaration under Section 564(b)(1) of the Act, 21 U.S.C.section 360bbb-3(b)(1), unless the authorization is terminated  or revoked sooner.       Influenza A by PCR NEGATIVE NEGATIVE Final   Influenza B by PCR NEGATIVE NEGATIVE Final    Comment: (NOTE) The Xpert Xpress SARS-CoV-2/FLU/RSV  plus assay is intended as an aid in the diagnosis of influenza from Nasopharyngeal swab specimens and should not be used as a sole basis for treatment. Nasal washings and aspirates are unacceptable for Xpert Xpress SARS-CoV-2/FLU/RSV testing.  Fact Sheet for  Patients: EntrepreneurPulse.com.au  Fact Sheet for Healthcare Providers: IncredibleEmployment.be  This test is not yet approved or cleared by the [place] FDA and has been authorized for detection and/or diagnosis of SARS-CoV-2 by FDA under an Emergency Use Authorization (EUA). This EUA will remain in effect (meaning this test can be used) for the duration of the COVID-19 declaration under Section 564(b)(1) of the Act, 21 U.S.C. section 360bbb-3(b)(1), unless the authorization is terminated or revoked.  Performed at [facility], [place] [address], [place], [place] [address]   MRSA PCR Screening     Status: None   Collection Time: 07/14/20  6:29 PM   Specimen: Nasal Mucosa; Nasopharyngeal  Result Value Ref Range Status   MRSA by PCR NEGATIVE NEGATIVE Final    Comment:        The GeneXpert MRSA Assay (FDA approved for NASAL specimens only), is one component of a comprehensive MRSA colonization surveillance program. It is not intended to diagnose MRSA infection nor to guide or monitor treatment for MRSA infections. Performed at [facility], [place] [address], [place], [place] [address]      Studies: DG Chest 2 View  Result Date: 07/14/2020 CLINICAL DATA:  Shortness of breath. EXAM: CHEST - 2 VIEW COMPARISON:  Chest x-ray 05/25/2020 FINDINGS: The tracheostomy tube is in good position, unchanged. The power port is in good position, unchanged. Stable mild cardiac enlargement and prominent mediastinal and hilar contours. Severe chronic underlying lung disease likely post pneumonic pulmonary fibrosis. Low lung volumes with vascular crowding and  atelectasis. Slight increase in patchy airspace opacity suspicious for superimposed infiltrates. No definite pleural effusions. IMPRESSION: Severe chronic underlying lung disease with suspected superimposed infiltrates. Electronically Signed   By: Marijo Sanes M.D.   On: 07/14/2020 14:13     Flora Lipps, MD  [facility] 07/16/2020  If 7PM-7AM, please contact night-coverage

## 2020-07-17 DIAGNOSIS — J479 Bronchiectasis, uncomplicated: Secondary | ICD-10-CM | POA: Diagnosis not present

## 2020-07-17 DIAGNOSIS — E119 Type 2 diabetes mellitus without complications: Secondary | ICD-10-CM | POA: Diagnosis not present

## 2020-07-17 DIAGNOSIS — U099 Post covid-19 condition, unspecified: Secondary | ICD-10-CM | POA: Diagnosis not present

## 2020-07-17 DIAGNOSIS — J9611 Chronic respiratory failure with hypoxia: Secondary | ICD-10-CM | POA: Diagnosis not present

## 2020-07-17 DIAGNOSIS — D649 Anemia, unspecified: Secondary | ICD-10-CM | POA: Diagnosis not present

## 2020-07-17 DIAGNOSIS — J8410 Pulmonary fibrosis, unspecified: Secondary | ICD-10-CM | POA: Diagnosis not present

## 2020-07-17 DIAGNOSIS — J189 Pneumonia, unspecified organism: Secondary | ICD-10-CM | POA: Diagnosis not present

## 2020-07-17 LAB — CBC
HCT: 27.2 % — ABNORMAL LOW (ref 39.0–52.0)
Hemoglobin: 8 g/dL — ABNORMAL LOW (ref 13.0–17.0)
MCH: 26.5 pg (ref 26.0–34.0)
MCHC: 29.4 g/dL — ABNORMAL LOW (ref 30.0–36.0)
MCV: 90.1 fL (ref 80.0–100.0)
Platelets: 225 10*3/uL (ref 150–400)
RBC: 3.02 MIL/uL — ABNORMAL LOW (ref 4.22–5.81)
RDW: 16.2 % — ABNORMAL HIGH (ref 11.5–15.5)
WBC: 12.3 10*3/uL — ABNORMAL HIGH (ref 4.0–10.5)
nRBC: 0 % (ref 0.0–0.2)

## 2020-07-17 LAB — BASIC METABOLIC PANEL
Anion gap: 5 (ref 5–15)
BUN: 26 mg/dL — ABNORMAL HIGH (ref 8–23)
CO2: 36 mmol/L — ABNORMAL HIGH (ref 22–32)
Calcium: 8.7 mg/dL — ABNORMAL LOW (ref 8.9–10.3)
Chloride: 99 mmol/L (ref 98–111)
Creatinine, Ser: 0.63 mg/dL (ref 0.61–1.24)
GFR, Estimated: >60 mL/min (ref >60)
Glucose, Bld: 177 mg/dL — ABNORMAL HIGH (ref 70–99)
Potassium: 4 mmol/L (ref 3.5–5.1)
Sodium: 140 mmol/L (ref 135–145)

## 2020-07-17 LAB — MAGNESIUM: Magnesium: 1.6 mg/dL — ABNORMAL LOW (ref 1.7–2.4)

## 2020-07-17 LAB — PHOSPHORUS: Phosphorus: 2.7 mg/dL (ref 2.5–4.6)

## 2020-07-17 MED ORDER — MAGNESIUM OXIDE 400 (241.3 MG) MG PO TABS
400.0000 mg | ORAL_TABLET | Freq: Two times a day (BID) | ORAL | Status: DC
  Administered 2020-07-17 – 2020-07-20 (×7): 400 mg via ORAL
  Filled 2020-07-17 (×6): qty 1

## 2020-07-17 MED ORDER — MAGNESIUM SULFATE 2 GM/50ML IV SOLN
2.0000 g | Freq: Once | INTRAVENOUS | Status: AC
  Administered 2020-07-17: 2 g via INTRAVENOUS
  Filled 2020-07-17: qty 50

## 2020-07-17 NOTE — Progress Notes (Addendum)
PROGRESS NOTE  Steve [name] KCL:[id number] DOB: Dec 10, 1955 DOA: 07/14/2020 PCP: [name], PA-C   LOS: 3 days   Brief narrative:  Steve [name] is a 65 y.o. male with medical history significant of chronic hypoxic respiratory failure with chronic trach and O2 use, bronchiectasis, pulmonary fibrosis, anxiety presented to hospital with dyspnea and increased cough and productive yellow sputum.  In the ED, chest x-ray showed infiltrates superimposed on his chronic fibrosis. He was started on vanc and cefepime. RT suctioned his trach.  Patient was then admitted to the hospital for further evaluation and treatment.  Required extensive suctioning, so was transferred to stepdown unit.  Assessment/Plan:  Principal Problem:   Pneumonia Active Problems:   Hodgkin's lymphoma ([place])   Pressure injury of skin   Tracheostomy dependence ([name])   Chronic respiratory failure with hypoxia ([name])   Hodgkin's lymphoma ([place])   Bronchiectasis ([name])   DM (diabetes mellitus), type 2 ([place])  Bilateral superimposed possible bacterial pneumonia with underlying pulmonary fibrosis and bronchiectasis.   Patient was initially on vancomycin and cefepime.  MRSA screen was negative.  on cefepime and Flagyl for now due to possibility of anaerobic organisms and aim for 5 to 7-day course.  Respiratory panel was negative for influenza and Covid.  Blood cultures negative in 2 days.  Follow urinary antigen for Legionella and strep-pending..  Continue bronchodilators, guaifenesin, respiratory care for tracheostomy. Currently on 10 L of oxygen by mask at 60% FiO2.  Saturating 98%. Could potentially change to Augmentin on discharge to complete 5 to 7-day course.  Chronic respiratory failure on chronic O2 and trach. Continue trach care.  RT on board.  Continue frequent suctioning, albuterol inhaler.  Continue antibiotics.   Chronic PEG      Continue nutrition through the PEG tube and orally.  Tolerating PEG  tube.  Normocytic anemia Likely anemia of chronic disease.  We will continue to follow closely.  Latest hemoglobin of 8.0  Anxiety On Ativan.  Continue Zoloft, trazodone   Diabetes mellitus type 2. Diet controlled at baseline.  Latest hemoglobin A1c on 05/25/2020 was 5.8.  Continue diabetic diet, Accu-Cheks.  Hypomagnesemia.  Latest magnesium 1.6.  Will replace with IV magnesium sulfate and p.o. magnesium oxide..check BMP in am.  GERD Continue PPI   Hodgkin's lymphoma     Followed by Dr. Marin Olp as outpatient.  Seen by oncology during hospitalization.  Pressure injury of the buttocks stage II.  Present on admission. Continue wound care.  Pressure Injury 07/14/20 Buttocks Medial Stage 2 -  Partial thickness loss of dermis presenting as a shallow open injury with a red, pink wound bed without slough. red, open skin (Active)  07/14/20 1700  Location: Buttocks  Location Orientation: Medial  Staging: Stage 2 -  Partial thickness loss of dermis presenting as a shallow open injury with a red, pink wound bed without slough.  Wound Description (Comments): red, open skin  Present on Admission: Yes    DVT prophylaxis: enoxaparin (LOVENOX) injection 40 mg Start: 07/14/20 2200  Code Status: Full code  Family Communication:  I spoke with the patients' spouse Ms. Alroy Dust on the phone and updated her about the clinical condition of the patient.  Status is: Inpatient  Remains inpatient appropriate because:IV treatments appropriate due to intensity of illness or inability to take PO and Inpatient level of care appropriate due to severity of illness, IV antibiotics, frequent tracheal suctioning.   Dispo: The patient is from: Home  Anticipated d/c is to: Home with home health likely by tomorrow.  Patient lives at home with his family who takes care of him.              Patient currently is not medically stable to d/c.   Difficult to place patient  No  Consultants:  Hemato-oncology  Procedures:  None  Anti-infectives:  Marland Kitchen Vancomycin 4/5>4/6 .  cefepime 4/5> . Metronidazole 4/6>  Anti-infectives (From admission, onward)   Start     Dose/Rate Route Frequency Ordered Stop   07/15/20 1200  metroNIDAZOLE (FLAGYL) IVPB 500 mg        500 mg 100 mL/hr over 60 Minutes Intravenous Every 8 hours 07/15/20 1024     07/15/20 0300  vancomycin (VANCOREADY) IVPB 750 mg/150 mL  Status:  Discontinued        750 mg 150 mL/hr over 60 Minutes Intravenous Every 12 hours 07/14/20 1613 07/15/20 1025   07/14/20 2200  ceFEPIme (MAXIPIME) 2 g in sodium chloride 0.9 % 100 mL IVPB        2 g 200 mL/hr over 30 Minutes Intravenous Every 8 hours 07/14/20 1613     07/14/20 1400  vancomycin (VANCOREADY) IVPB 1250 mg/250 mL        1,250 mg 166.7 mL/hr over 90 Minutes Intravenous STAT 07/14/20 1354 07/14/20 1627   07/14/20 1330  ceFEPIme (MAXIPIME) 2 g in sodium chloride 0.9 % 100 mL IVPB        2 g 200 mL/hr over 30 Minutes Intravenous  Once 07/14/20 1318 07/14/20 1440      Subjective: Today, patient was seen and examined at bedside.  Patient has mild cough but no pain fever chills or rigor.  Objective: Vitals:   07/17/20 0756 07/17/20 0800  BP:  (!) 106/57  Pulse: 88 93  Resp: (!) 25 (!) 24  Temp:    SpO2: 94% 98%    Intake/Output Summary (Last 24 hours) at 07/17/2020 0807 Last data filed at 07/17/2020 0656 Gross per 24 hour  Intake 190 ml  Output 1670 ml  Net -1480 ml   Filed Weights   07/14/20 1352 07/15/20 0045  Weight: 62.1 kg 60.5 kg   Body mass index is 21.53 kg/m.   Physical Exam: General: Thinly built, not in obvious distress, chronically ill, on trach collar at 10 L/min, HENT:   No scleral pallor or icterus noted.  Tracheostomy in place. Chest:   Diminished breath sounds bilaterally.  Coarse breath sounds noted bilaterally. CVS: S1 &S2 heard. No murmur.  Regular rate and rhythm. Abdomen: Soft, nontender, nondistended.   Bowel sounds are heard.  PEG tube in place without induration. Extremities: No cyanosis, clubbing or edema.  Peripheral pulses are palpable. Psych: Alert, awake and oriented, normal mood CNS:  No cranial nerve deficits.  Moves all extremities. Skin: Warm and dry.  No rashes noted.  Data Review: I have personally reviewed the following laboratory data and studies,  CBC: Recent Labs  Lab 07/10/20 1130 07/14/20 1233 07/15/20 0244 07/16/20 0249 07/17/20 0330  WBC 15.6* 16.3* 16.4* 12.7* 12.3*  NEUTROABS 11.0* 13.9*  --   --   --   HGB 9.5* 9.3* 8.5* 8.3* 8.0*  HCT 31.4* 31.1* 28.4* 28.3* 27.2*  MCV 86.3 87.6 88.2 90.4 90.1  PLT 247 234 210 213 741   Basic Metabolic Panel: Recent Labs  Lab 07/10/20 1130 07/14/20 1233 07/15/20 0244 07/16/20 0249 07/17/20 0330  NA 139 136 141 139 140  K 4.7 3.7 4.1  4.1 4.0  CL 90* 93* 99 98 99  CO2 46* 36* 35* 34* 36*  GLUCOSE 133* 148* 141* 158* 177*  BUN 40* 54* 41* 29* 26*  CREATININE 0.87 0.87 0.71 0.75 0.63  CALCIUM 10.7* 9.8 9.2 9.1 8.7*  MG  --   --   --  1.7 1.6*  PHOS  --   --   --  2.8 2.7   Liver Function Tests: Recent Labs  Lab 07/10/20 1130 07/14/20 1233 07/15/20 0244 07/16/20 0249  AST 19 21 19 19   ALT 21 21 18 18   ALKPHOS 59 54 51 46  BILITOT 0.5 1.0 0.6 0.5  PROT 7.9 7.8 7.1 6.6  ALBUMIN 3.1* 2.8* 2.5* 2.3*   No results for input(s): LIPASE, AMYLASE in the last 168 hours. No results for input(s): AMMONIA in the last 168 hours. Cardiac Enzymes: No results for input(s): CKTOTAL, CKMB, CKMBINDEX, TROPONINI in the last 168 hours. BNP (last 3 results) No results for input(s): BNP in the last 8760 hours.  ProBNP (last 3 results) No results for input(s): PROBNP in the last 8760 hours.  CBG: No results for input(s): GLUCAP in the last 168 hours. Recent Results (from the past 240 hour(s))  Blood culture (routine x 2)     Status: None (Preliminary result)   Collection Time: 07/14/20 12:33 PM   Specimen: BLOOD   Result Value Ref Range Status   Specimen Description   Final    BLOOD LEFT ANTECUBITAL Performed at [facility] [address] [place], [place] [address]    Special Requests   Final    BOTTLES DRAWN AEROBIC AND ANAEROBIC Blood Culture adequate volume Performed at [facility] [address] [place], [place] [address]    Culture   Final    NO GROWTH 2 DAYS Performed at [facility] [address] [place], [place] [address]    Report Status PENDING  Incomplete  Resp Panel by RT-PCR (Flu A&B, Covid) Nasopharyngeal Swab     Status: None   Collection Time: 07/14/20  2:58 PM   Specimen: Nasopharyngeal Swab; Nasopharyngeal(NP) swabs in vial transport medium  Result Value Ref Range Status   SARS Coronavirus 2 by RT PCR NEGATIVE NEGATIVE Final    Comment: (NOTE) SARS-CoV-2 target nucleic acids are NOT DETECTED.  The SARS-CoV-2 RNA is generally detectable in upper respiratory specimens during the acute phase of infection. The lowest concentration of SARS-CoV-2 viral copies this assay can detect is 138 copies/mL. A negative result does not preclude SARS-Cov-2 infection and should not be used as the sole basis for treatment or other patient management decisions. A negative result may occur with  improper specimen collection/handling, submission of specimen other than nasopharyngeal swab, presence of viral mutation(s) within the areas targeted by this assay, and inadequate number of viral copies(<138 copies/mL). A negative result must be combined with clinical observations, patient history, and epidemiological information. The expected result is Negative.  Fact Sheet for Patients:  EntrepreneurPulse.com.au  Fact Sheet for Healthcare Providers:  IncredibleEmployment.be  This test is no t yet approved or cleared by the [place] FDA and  has been authorized for detection and/or diagnosis of  SARS-CoV-2 by FDA under an Emergency Use Authorization (EUA). This EUA will remain  in effect (meaning this test can be used) for the duration of the COVID-19 declaration under Section 564(b)(1) of the Act, 21 U.S.C.section 360bbb-3(b)(1), unless the authorization is terminated  or revoked sooner.  Influenza A by PCR NEGATIVE NEGATIVE Final   Influenza B by PCR NEGATIVE NEGATIVE Final    Comment: (NOTE) The Xpert Xpress SARS-CoV-2/FLU/RSV plus assay is intended as an aid in the diagnosis of influenza from Nasopharyngeal swab specimens and should not be used as a sole basis for treatment. Nasal washings and aspirates are unacceptable for Xpert Xpress SARS-CoV-2/FLU/RSV testing.  Fact Sheet for Patients: EntrepreneurPulse.com.au  Fact Sheet for Healthcare Providers: IncredibleEmployment.be  This test is not yet approved or cleared by the [place] FDA and has been authorized for detection and/or diagnosis of SARS-CoV-2 by FDA under an Emergency Use Authorization (EUA). This EUA will remain in effect (meaning this test can be used) for the duration of the COVID-19 declaration under Section 564(b)(1) of the Act, 21 U.S.C. section 360bbb-3(b)(1), unless the authorization is terminated or revoked.  Performed at [facility], [place] [address] [place], [place] [address]   MRSA PCR Screening     Status: None   Collection Time: 07/14/20  6:29 PM   Specimen: Nasal Mucosa; Nasopharyngeal  Result Value Ref Range Status   MRSA by PCR NEGATIVE NEGATIVE Final    Comment:        The GeneXpert MRSA Assay (FDA approved for NASAL specimens only), is one component of a comprehensive MRSA colonization surveillance program. It is not intended to diagnose MRSA infection nor to guide or monitor treatment for MRSA infections. Performed at [facility], [place] [address] [place],  [address]       Studies: No results found.   Flora Lipps, MD  [facility] 07/17/2020  If 7PM-7AM, please contact night-coverage

## 2020-07-17 NOTE — Progress Notes (Addendum)
HEMATOLOGY-ONCOLOGY PROGRESS NOTE  SUBJECTIVE: The patient was resting quietly.  He is not complaining of any chest pain, nausea, vomiting, fever, chills.  Continues to have a cough with sputum duction which requires suctioning.  Oncology History  Hodgkin's lymphoma ([place])  02/25/2019 Initial Diagnosis   Hodgkin lymphoma ([place])   02/28/2019 Imaging   CT neck: IMPRESSION: Right paratracheal enlarged lymph node and right supraclavicular enlarged lymph nodes compatible with neoplasm. No pharyngeal mass in the neck. No other adenopathy in the neck. Tissue sampling is recommended.   02/28/2019 Imaging   CT CAP: IMPRESSION: 1. Bulky right supraclavicular and mediastinal adenopathy most compatible with metastatic disease. Additionally there is porta hepatic and retroperitoneal adenopathy within the abdomen concerning for metastatic disease. 2. Mild fibrotic changes involving the lungs bilaterally. 3. See dedicated neck CT report.   03/08/2019 Pathology Results   SURGICAL PATHOLOGY  CASE: [id number]  [name]  Surgical Pathology Report      Clinical History: No known primary, now with right supraclavicular  lymphadenopathy, post US guided Bx. (cm)      FINAL MICROSCOPIC DIAGNOSIS:   A. LYMPH NODE, RIGHT SUPRACLAVICULAR, BIOPSY:  - Atypical lymphoid proliferation  - See comment   COMMENT:   The sections show needle core biopsy fragments of lymph nodal tissue  displaying predominance of small round to slightly irregular lymphocytes  admixed with a minor population of scattered large atypical mononuclear  and multilobated lymphoid appearing cells with variably prominent  nucleoli.  This is admixed with scattering of eosinophils in some areas.  Flow cytometric analysis was attempted but there was insufficient  material present in the sample ([place]).  Immunohistochemical  stains were performed including CD10, CD15, CD20, CD30, CD5, LCA, cyclin  D1,  PAX 5, CD3 and EBV with appropriate controls.  LCA is diffusely  positive.  The small lymphoid cells show a mixture of T and B cells with  predominance of T cells.  No significant EBV, CD10 or cyclin D1  positivity is identified.  The larger atypical lymphoid cells appear to  be positive for CD30 and some for CD15 and PAX 5.  The overall findings  are very limited but atypical and worrisome for a lymphoproliferative  process particularly Hodgkin lymphoma.  Excisional biopsy is recommended  including fresh tissue for lymphoma work-up in order to further evaluate  this process    03/20/2019 Pathology Results   FINAL MICROSCOPIC DIAGNOSIS:   A. LYMPH NODE, DEEP RIGHT CERVICAL POSTERIOR TRIANGLE, EXCISION:  -Classical Hodgkin lymphoma  -See comment   COMMENT:   Sections of the lymph node show effacement of the architecture by a  vaguely nodular lymphoproliferative process characterized by a  polymorphous cellular proliferation of small lymphocytes, eosinophils,  plasma cells in addition to variable numbers of large atypical  mononuclear and multi-lobated lymphoid appearing cells with variably  prominent nucleoli characteristic of Reed-Sternberg cells and variants  including lacunar cells.  Areas of early fibrosis surrounding some of  the nodules are also present.  Flow cytometric analysis was performed  ([id number]) and shows predominance of T lymphocytes with nonspecific  changes in addition to a minor polyclonal B-cell population with no  abnormal phenotype. Immunohistochemical stains were performed including  CD20, CD3, PAX 5, CD15, CD30, LCA and in situ hybridization for EBV with  appropriate controls.  The large atypical lymphoid appearing cells are  positive for CD15, CD30 and weakly for PAX 5 and negative for CD3, CD20,  LCA and EBV.  The small lymphocytes in  the background show a mixture of  T and B-cells with predominance of T-cells. The morphologic and  immunophenotypic  features are consistent with classical Hodgkin lymphoma  which is best subclassified as nodular sclerosis type.    04/03/2019 Imaging   PET: IMPRESSION: 1. Hypermetabolic lymphadenopathy involving the neck, chest and abdomen consistent with known Hodgkin's lymphoma. Deauville 5. 2. No findings for osseous metastatic disease.   04/03/2019 Cancer Staging   Staging form: Hodgkin and Non-Hodgkin Lymphoma, AJCC 8th Edition - Clinical stage from 04/03/2019: Stage III (Hodgkin lymphoma) - Signed by Tish Men, MD on 04/08/2019   04/08/2019 - 06/17/2019 Chemotherapy         07/10/2020 -  Chemotherapy    Patient is on Treatment Plan: HODGKINS LYMPHOMA BRENTUXIMAB Q21D      Hodgkin's lymphoma ([place])  08/29/2019 Initial Diagnosis   Hodgkin's lymphoma ([place])   07/10/2020 -  Chemotherapy    Patient is on Treatment Plan: HODGKINS LYMPHOMA BRENTUXIMAB Q21D         REVIEW OF SYSTEMS:   Constitutional: Denies fevers, chills  Eyes: Denies blurriness of vision Ears, nose, mouth, throat, and face: Denies mucositis or sore throat Respiratory: Continues have a cough with sputum production Cardiovascular: Denies palpitation, chest discomfort Gastrointestinal:  Denies nausea, heartburn or change in bowel habits Skin: Denies abnormal skin rashes Lymphatics: Denies new lymphadenopathy or easy bruising Neurological:Denies numbness, tingling or new weaknesses Behavioral/Psych: Mood is stable, no new changes  Extremities: No lower extremity edema All other systems were reviewed with the patient and are negative.  I have reviewed the past medical history, past surgical history, social history and family history with the patient and they are unchanged from previous note.   PHYSICAL EXAMINATION: ECOG PERFORMANCE STATUS: 2 - Symptomatic, <50% confined to bed  Vitals:   07/17/20 0800 07/17/20 0900  BP: (!) 106/57 (!) 95/46  Pulse: 93 88  Resp: (!) 24   Temp: 98.2 F (36.8 C)   SpO2: 98% 100%    Filed Weights   07/14/20 1352 07/15/20 0045  Weight: 62.1 kg 60.5 kg    Intake/Output from previous day: 04/07 0701 - 04/08 0700 In: 190 [NG/GT:70] Out: 1670 [Urine:1670]  GENERAL:alert, no distress and comfortable SKIN: skin color, texture, turgor are normal, no rashes or significant lesions LUNGS: Diminished breath sounds, coarse breath sounds bilaterally HEART: regular rate & rhythm and no murmurs and no lower extremity edema ABDOMEN:abdomen soft, non-tender and normal bowel sounds, PEG tube in place Musculoskeletal:no cyanosis of digits and no clubbing  NEURO: alert & oriented x 3 with fluent speech, no focal motor/sensory deficits  LABORATORY DATA:  I have reviewed the data as listed CMP Latest Ref Rng & Units 07/17/2020 07/16/2020 07/15/2020  Glucose 70 - 99 mg/dL 177(H) 158(H) 141(H)  BUN 8 - 23 mg/dL 26(H) 29(H) 41(H)  Creatinine 0.61 - 1.24 mg/dL 0.63 0.75 0.71  Sodium 135 - 145 mmol/L 140 139 141  Potassium 3.5 - 5.1 mmol/L 4.0 4.1 4.1  Chloride 98 - 111 mmol/L 99 98 99  CO2 22 - 32 mmol/L 36(H) 34(H) 35(H)  Calcium 8.9 - 10.3 mg/dL 8.7(L) 9.1 9.2  Total Protein 6.5 - 8.1 g/dL - 6.6 7.1  Total Bilirubin 0.3 - 1.2 mg/dL - 0.5 0.6  Alkaline Phos 38 - 126 U/L - 46 51  AST 15 - 41 U/L - 19 19  ALT 0 - 44 U/L - 18 18    Lab Results  Component Value Date   WBC 12.3 (H) 07/17/2020  HGB 8.0 (L) 07/17/2020   HCT 27.2 (L) 07/17/2020   MCV 90.1 07/17/2020   PLT 225 07/17/2020   NEUTROABS 13.9 (H) 07/14/2020    DG Chest 2 View  Result Date: 07/14/2020 CLINICAL DATA:  Shortness of breath. EXAM: CHEST - 2 VIEW COMPARISON:  Chest x-ray 05/25/2020 FINDINGS: The tracheostomy tube is in good position, unchanged. The power port is in good position, unchanged. Stable mild cardiac enlargement and prominent mediastinal and hilar contours. Severe chronic underlying lung disease likely post pneumonic pulmonary fibrosis. Low lung volumes with vascular crowding and atelectasis. Slight  increase in patchy airspace opacity suspicious for superimposed infiltrates. No definite pleural effusions. IMPRESSION: Severe chronic underlying lung disease with suspected superimposed infiltrates. Electronically Signed   By: Marijo Sanes M.D.   On: 07/14/2020 14:13   NM PET Image Restag (PS) Skull Base To Thigh  Result Date: 06/21/2020 CLINICAL DATA:  Subsequent treatment strategy for Hodgkin's lymphoma mediastinal adenopathy. EXAM: NUCLEAR MEDICINE PET SKULL BASE TO THIGH TECHNIQUE: 8.0 mCi F-18 FDG was injected intravenously. Full-ring PET imaging was performed from the skull base to thigh after the radiotracer. CT data was obtained and used for attenuation correction and anatomic localization. Fasting blood glucose: 114 mg/dl COMPARISON:  March 08/2019 FINDINGS: Mediastinal blood pool activity: SUV max 2.4 Liver activity: SUV max 3.4 Neck:: Hypermetabolic RIGHT supraclavicular node.  See below Incidental CT findings: none CHEST: Intense metabolic activity associated with bulky anterior mediastinal substernal lymph nodes which are new from prior. Example RIGHT internal mammary node measuring 2.2 cm with SUV max equal 13.9. There is a mat of intense hypermetabolic nodes within the anterior mediastinum measuring approximately 8 cm in total. RIGHT supraclavicular node is hypermetabolic and enlarged measuring 1.9 cm with SUV max equal 10.9. Incidental CT findings: Tracheostomy tube in good position. Chronic interstitial lung disease noted. No hypermetabolic pulmonary nodules ABDOMEN/PELVIS: Spleen is normal size and normal metabolic activity. No hypermetabolic upper abdominal, nodes Hypermetabolic RIGHT common iliac lymph node with SUV max equal 9.9. Hypermetabolic RIGHT external iliac lymph node measures 17 mm with SUV max equal 11.6 (image 162). Incidental CT findings: Percutaneous gastrostomy tube noted. Bilateral renal calculi. Calculus in the RIGHT renal pelvis without high-grade obstruction. SKELETON:  There is new foci of intense radiotracer activity within the skeleton. For example broad lesion in the posterior LEFT iliac bone with SUV max equal 11.5. Lesion on image 212. Lesion measures approximately 3 cm but not evident on the CT portion exam. A similar lesion in the central sacrum with SUV max equal 9.5. Lesion in the tip of the RIGHT iliac wing with SUV max equal 13.4. Additional lesions scattered in the spine and ribs. Incidental CT findings: none IMPRESSION: 1. Unfortunately, evidence of lymphoma recurrence with new bulky substernal anterior mediastinal adenopathy which is intensely hypermetabolic (Deauville 5). 2. Hypermetabolic RIGHT supraclavicular node if tissue sampling warranted. 3. New intensely hypermetabolic multifocal skeletal metastasis. 4. Incidental findings of chronic interstitial lung disease, bilateral nephrolithiasis and RIGHT renal pelvis calculus. Electronically Signed   By: Suzy Bouchard M.D.   On: 06/21/2020 11:32    ASSESSMENT AND PLAN: 1.  Relapsed Hodgkin's lymphoma 2.  Bilateral pneumonia 3.  Pulmonary fibrosis and bronchiectasis 4.  Chronic respiratory failure 5.  Normocytic anemia 6.  Diabetes mellitus type 2 7.  Anxiety  -The patient continues on bronchodilators, guaifenesin, and respiratory care for tracheostomy.  On IV antibiotics and will likely transition to Augmentin soon.  Management per hospitalist -The patient is a receiving treatment for  his Hodgkin's lymphoma with Adcetris which would not compromise his immune system. -The patient is anemic.  PRBC transfusion recommended for hemoglobin less than 8. -Management of chronic medical conditions per hospitalist.   LOS: 3 days   Mikey Bussing, DNP, AGPCNP-BC, AOCNP 07/17/20

## 2020-07-17 NOTE — Progress Notes (Signed)
This RN asked pt if he could try to stand. Pt stated he didn't want to because he felt "weak and having difficulty breathing" at that time. Pt has a trach collar.

## 2020-07-17 NOTE — Progress Notes (Signed)
Pharmacy Antibiotic Note  Steve [name] is a 65 y.o. male with Hodgkin's lymphoma currently undergoing chemotherapy treatment (last treatment on 07/10/20) and chronic trach, presented to the ED on 4/5 with dyspnea.  CXR showed findings with concern for PNA.  Pharmacy has been consulted to dose vancomycin and cefepime for infection 07/17/2020  AF, WBC 12.7, SCr 0.63 D#4 abx.    Plan: Continues on  cefepime 2 gm IV q8h Continues on flagyl 500 mg IV q8h per TRH F/u LOT - 5-7 days per [facility] note F/u transition to augmentin _______________________________________  Height: 5\' 6"  (167.6 cm) Weight: 60.5 kg (133 lb 6.1 oz) IBW/kg (Calculated) : 63.8  Temp (24hrs), Avg:98 F (36.7 C), Min:97.6 F (36.4 C), Max:98.2 F (36.8 C)  Recent Labs  Lab 07/14/20 1233 07/14/20 1235 07/15/20 0244 07/16/20 0249 07/17/20 0330  WBC 16.3*  --  16.4* 12.7* 12.3*  CREATININE 0.87  --  0.71 0.75 0.63  LATICACIDVEN  --  0.8  --   --   --     Estimated Creatinine Clearance: 79.8 mL/min (by C-G formula based on SCr of 0.63 mg/dL).    No Known Allergies  4/5 vanc>> 4/6 4/5 cefepime>> 4/6 flagyl >>  4/5 bcx x2: ngtd  4/5 MRSA PCR: neg 4/5 Urine strep Ag: not sent 4/5 Urine legionella Ag: not sent  Thank you for allowing pharmacy to be a part of this patient's care.  Eudelia Bunch, Pharm.D 07/17/2020 2:00 PM

## 2020-07-18 DIAGNOSIS — E119 Type 2 diabetes mellitus without complications: Secondary | ICD-10-CM | POA: Diagnosis not present

## 2020-07-18 DIAGNOSIS — J189 Pneumonia, unspecified organism: Secondary | ICD-10-CM | POA: Diagnosis not present

## 2020-07-18 DIAGNOSIS — J9611 Chronic respiratory failure with hypoxia: Secondary | ICD-10-CM | POA: Diagnosis not present

## 2020-07-18 DIAGNOSIS — J479 Bronchiectasis, uncomplicated: Secondary | ICD-10-CM | POA: Diagnosis not present

## 2020-07-18 LAB — BASIC METABOLIC PANEL
Anion gap: 4 — ABNORMAL LOW (ref 5–15)
BUN: 26 mg/dL — ABNORMAL HIGH (ref 8–23)
CO2: 38 mmol/L — ABNORMAL HIGH (ref 22–32)
Calcium: 8.8 mg/dL — ABNORMAL LOW (ref 8.9–10.3)
Chloride: 95 mmol/L — ABNORMAL LOW (ref 98–111)
Creatinine, Ser: 0.76 mg/dL (ref 0.61–1.24)
GFR, Estimated: >60 mL/min (ref >60)
Glucose, Bld: 183 mg/dL — ABNORMAL HIGH (ref 70–99)
Potassium: 4.5 mmol/L (ref 3.5–5.1)
Sodium: 137 mmol/L (ref 135–145)

## 2020-07-18 LAB — CBC
HCT: 28.3 % — ABNORMAL LOW (ref 39.0–52.0)
Hemoglobin: 8.2 g/dL — ABNORMAL LOW (ref 13.0–17.0)
MCH: 25.9 pg — ABNORMAL LOW (ref 26.0–34.0)
MCHC: 29 g/dL — ABNORMAL LOW (ref 30.0–36.0)
MCV: 89.6 fL (ref 80.0–100.0)
Platelets: 229 10*3/uL (ref 150–400)
RBC: 3.16 MIL/uL — ABNORMAL LOW (ref 4.22–5.81)
RDW: 16.1 % — ABNORMAL HIGH (ref 11.5–15.5)
WBC: 13.5 10*3/uL — ABNORMAL HIGH (ref 4.0–10.5)
nRBC: 0 % (ref 0.0–0.2)

## 2020-07-18 MED ORDER — LOPERAMIDE HCL 2 MG PO CAPS: 2.0000 mg | ORAL_CAPSULE | ORAL | Status: DC | PRN

## 2020-07-18 MED ORDER — CALAMINE EX LOTN: TOPICAL_LOTION | CUTANEOUS | 0 refills | Status: AC | PRN

## 2020-07-18 MED ORDER — MAGNESIUM OXIDE 400 (241.3 MG) MG PO TABS: 400.0000 mg | ORAL_TABLET | Freq: Two times a day (BID) | ORAL | 0 refills | Status: DC

## 2020-07-18 MED ORDER — AMOXICILLIN-POT CLAVULANATE 875-125 MG PO TABS: 1.0000 | ORAL_TABLET | Freq: Two times a day (BID) | ORAL | 0 refills | Status: DC

## 2020-07-18 MED ORDER — LOPERAMIDE HCL 1 MG/7.5ML PO SUSP
2.0000 mg | ORAL | Status: DC | PRN
  Administered 2020-07-18: 2 mg
  Filled 2020-07-18 (×2): qty 15

## 2020-07-18 MED ORDER — GUAIFENESIN 100 MG/5ML PO SOLN: 5.0000 mL | ORAL | 0 refills | Status: AC | PRN

## 2020-07-18 MED ORDER — ONDANSETRON HCL 4 MG PO TABS: 4.0000 mg | ORAL_TABLET | Freq: Four times a day (QID) | ORAL | 0 refills | Status: AC | PRN

## 2020-07-18 MED ORDER — SODIUM CHLORIDE 0.9 % IV SOLN
INTRAVENOUS | Status: DC | PRN
  Administered 2020-07-18 – 2020-07-19 (×2): 250 mL via INTRAVENOUS

## 2020-07-18 NOTE — Progress Notes (Signed)
Patients oxygen level 95% via trach collar @ 50% and 10 liters. RT @ bedside and said this is compatible with home settings

## 2020-07-18 NOTE — Progress Notes (Signed)
Patient notified writer that he do not feel right he states, "every time I close my eyes I feel like I am going to pass out". Vital signs stable and Dr. Louanne Belton notified.

## 2020-07-18 NOTE — Progress Notes (Signed)
PROGRESS NOTE  Steve [name] FHL:[id number] DOB: [birth year] DOA: 07/14/2020 PCP: [name], PA-C   LOS: 4 days   Brief narrative:  Steve [name] is a 65 y.o. male with medical history significant of chronic hypoxic respiratory failure with chronic trach and O2 use, bronchiectasis, pulmonary fibrosis, anxiety presented to hospital with dyspnea and increased cough and productive yellow sputum.  In the ED, chest x-ray showed infiltrates superimposed on his chronic fibrosis. He was started on vanc and cefepime. RT suctioned his trach.  Patient was then admitted to the hospital for further evaluation and treatment.  Required extensive suctioning, so was transferred to stepdown unit.  Assessment/Plan:  Principal Problem:   Pneumonia Active Problems:   Hodgkin's lymphoma ([place])   Pressure injury of skin   Tracheostomy dependence ([name])   Chronic respiratory failure with hypoxia ([name])   Hodgkin's lymphoma ([place])   Bronchiectasis ([name])   DM (diabetes mellitus), type 2 ([place])  Bilateral superimposed possible bacterial pneumonia with underlying pulmonary fibrosis and bronchiectasis.   Patient was initially on vancomycin and cefepime.  MRSA screen was negative.  on cefepime and Flagyl for now due to possibility of anaerobic organisms and aim for 5 to 7-day course.  Respiratory panel was negative for influenza and Covid.    Follow urinary antigen for Legionella and strep-pending..  Continue bronchodilators, guaifenesin, respiratory care for tracheostomy. Currently on 10 L of oxygen by mask at 50% FiO2.  Saturating 95%. Will change to Augmentin on discharge to complete 5 to 7-day course.  Blood cultures negative in 4 days.  Chronic respiratory failure on chronic O2 and trach. Continue trach care.  RT on board.  Continue frequent suctioning, albuterol inhaler.  Continue antibiotics.   Chronic PEG      Continue nutrition through the PEG tube and orally.  Tolerating PEG tube.  Normocytic  anemia Likely anemia of chronic disease.  We will continue to follow closely.  Latest hemoglobin of 8.2  Anxiety   Continue Zoloft, trazodone   Diabetes mellitus type 2. Diet controlled at baseline.  Latest hemoglobin A1c on 05/25/2020 was 5.8.  Continue diabetic diet, Accu-Cheks.  Hypomagnesemia.  Replenished.  Continue p.o. magnesium oxide.Marland Kitchen  GERD Continue PPI   Hodgkin's lymphoma     Followed by Dr. Marin Olp as outpatient.  Seen by oncology during hospitalization.  Pressure injury of the buttocks stage II.  Present on admission. Continue wound care.  Pressure Injury 07/14/20 Buttocks Medial Stage 2 -  Partial thickness loss of dermis presenting as a shallow open injury with a red, pink wound bed without slough. red, open skin (Active)  07/14/20 1700  Location: Buttocks  Location Orientation: Medial  Staging: Stage 2 -  Partial thickness loss of dermis presenting as a shallow open injury with a red, pink wound bed without slough.  Wound Description (Comments): red, open skin  Present on Admission: Yes    DVT prophylaxis: enoxaparin (LOVENOX) injection 40 mg Start: 07/14/20 2200  Code Status: Full code  Family Communication:  None today.  I spoke with the patients' spouse yesterday. Status is: Inpatient  Remains inpatient appropriate because:IV treatments appropriate due to intensity of illness or inability to take PO and Inpatient level of care appropriate due to severity of illness, IV antibiotics, frequent tracheal suctioning.   Dispo: The patient is from: Home              Anticipated d/c is to: Home with home health likely by tomorrow.  Patient lives at home with his  family who takes care of him.  Patient feels little dizzy  and unsecured about discharge today              Patient currently is not medically stable to d/c.   Difficult to place patient No  Consultants:  Hemato-oncology  Procedures:  None  Anti-infectives:  Marland Kitchen Vancomycin 4/5>4/6 .  cefepime  4/5> . Metronidazole 4/6>  Anti-infectives (From admission, onward)   Start     Dose/Rate Route Frequency Ordered Stop   07/18/20 0000  amoxicillin-clavulanate (AUGMENTIN) 875-125 MG tablet        1 tablet Oral 2 times daily 07/18/20 1018 07/23/20 2359   07/15/20 1200  metroNIDAZOLE (FLAGYL) IVPB 500 mg        500 mg 100 mL/hr over 60 Minutes Intravenous Every 8 hours 07/15/20 1024     07/15/20 0300  vancomycin (VANCOREADY) IVPB 750 mg/150 mL  Status:  Discontinued        750 mg 150 mL/hr over 60 Minutes Intravenous Every 12 hours 07/14/20 1613 07/15/20 1025   07/14/20 2200  ceFEPIme (MAXIPIME) 2 g in sodium chloride 0.9 % 100 mL IVPB        2 g 200 mL/hr over 30 Minutes Intravenous Every 8 hours 07/14/20 1613     07/14/20 1400  vancomycin (VANCOREADY) IVPB 1250 mg/250 mL        1,250 mg 166.7 mL/hr over 90 Minutes Intravenous STAT 07/14/20 1354 07/14/20 1627   07/14/20 1330  ceFEPIme (MAXIPIME) 2 g in sodium chloride 0.9 % 100 mL IVPB        2 g 200 mL/hr over 30 Minutes Intravenous  Once 07/14/20 1318 07/14/20 1440      Subjective: Today, patient was seen and examined at bedside.  Denies any pain, nausea, vomiting, fever or chills.  He feels like he is might pass out when he closes his eyes.  Does not feel completely ready for discharge   Objective: Vitals:   07/18/20 1200 07/18/20 1327  BP: 100/62   Pulse: 85   Resp: (!) 38   Temp:  98.1 F (36.7 C)  SpO2: 98%     Intake/Output Summary (Last 24 hours) at 07/18/2020 1416 Last data filed at 07/18/2020 1301 Gross per 24 hour  Intake 3561.41 ml  Output 1875 ml  Net 1686.41 ml   Filed Weights   07/14/20 1352 07/15/20 0045  Weight: 62.1 kg 60.5 kg   Body mass index is 21.53 kg/m.   Physical Exam:  General: Thinly built, not in obvious distress, chronically ill, on trach collar at 10 L/min HENT:   No scleral pallor or icterus noted. Oral mucosa is moist.  Tracheostomy in place. Chest:    Diminished breath sounds  bilaterally.  Coarse breath sounds noted.  CVS: S1 &S2 heard. No murmur.  Regular rate and rhythm. Abdomen: Soft, nontender, nondistended.  Bowel sounds are heard.  PEG tube in place without induration or erythema. Extremities: No cyanosis, clubbing or edema.  Peripheral pulses are palpable. Psych: Alert, awake and oriented, normal mood CNS:  No cranial nerve deficits.  Power equal in all extremities.   Skin: Warm and dry.  No rashes noted.   Data Review: I have personally reviewed the following laboratory data and studies,  CBC: Recent Labs  Lab 07/14/20 1233 07/15/20 0244 07/16/20 0249 07/17/20 0330 07/18/20 0424  WBC 16.3* 16.4* 12.7* 12.3* 13.5*  NEUTROABS 13.9*  --   --   --   --   HGB 9.3*  8.5* 8.3* 8.0* 8.2*  HCT 31.1* 28.4* 28.3* 27.2* 28.3*  MCV 87.6 88.2 90.4 90.1 89.6  PLT 234 210 213 225 675   Basic Metabolic Panel: Recent Labs  Lab 07/14/20 1233 07/15/20 0244 07/16/20 0249 07/17/20 0330 07/18/20 0424  NA 136 141 139 140 137  K 3.7 4.1 4.1 4.0 4.5  CL 93* 99 98 99 95*  CO2 36* 35* 34* 36* 38*  GLUCOSE 148* 141* 158* 177* 183*  BUN 54* 41* 29* 26* 26*  CREATININE 0.87 0.71 0.75 0.63 0.76  CALCIUM 9.8 9.2 9.1 8.7* 8.8*  MG  --   --  1.7 1.6*  --   PHOS  --   --  2.8 2.7  --    Liver Function Tests: Recent Labs  Lab 07/14/20 1233 07/15/20 0244 07/16/20 0249  AST 21 19 19   ALT 21 18 18   ALKPHOS 54 51 46  BILITOT 1.0 0.6 0.5  PROT 7.8 7.1 6.6  ALBUMIN 2.8* 2.5* 2.3*   No results for input(s): LIPASE, AMYLASE in the last 168 hours. No results for input(s): AMMONIA in the last 168 hours. Cardiac Enzymes: No results for input(s): CKTOTAL, CKMB, CKMBINDEX, TROPONINI in the last 168 hours. BNP (last 3 results) No results for input(s): BNP in the last 8760 hours.  ProBNP (last 3 results) No results for input(s): PROBNP in the last 8760 hours.  CBG: No results for input(s): GLUCAP in the last 168 hours. Recent Results (from the past 240 hour(s))   Blood culture (routine x 2)     Status: None (Preliminary result)   Collection Time: 07/14/20 12:26 PM   Specimen: BLOOD  Result Value Ref Range Status   Specimen Description   Final    BLOOD RIGHT ANTECUBITAL Performed at [facility] [address] [place], [place] [address]    Special Requests   Final    BOTTLES DRAWN AEROBIC AND ANAEROBIC Blood Culture adequate volume Performed at [facility] [address] [place], [place] [address]    Culture   Final    NO GROWTH 4 DAYS Performed at [facility], [place] [address], [place], [place] [address]    Report Status PENDING  Incomplete  Blood culture (routine x 2)     Status: None (Preliminary result)   Collection Time: 07/14/20 12:33 PM   Specimen: BLOOD  Result Value Ref Range Status   Specimen Description   Final    BLOOD LEFT ANTECUBITAL Performed at [facility] [address], [place], [place] [address]    Special Requests   Final    BOTTLES DRAWN AEROBIC AND ANAEROBIC Blood Culture adequate volume Performed at [facility] [address] [place], [place] [address]    Culture   Final    NO GROWTH 4 DAYS Performed at [facility], [place] [address], [place], [place] [address]    Report Status PENDING  Incomplete  Resp Panel by RT-PCR (Flu A&B, Covid) Nasopharyngeal Swab     Status: None   Collection Time: 07/14/20  2:58 PM   Specimen: Nasopharyngeal Swab; Nasopharyngeal(NP) swabs in vial transport medium  Result Value Ref Range Status   SARS Coronavirus 2 by RT PCR NEGATIVE NEGATIVE Final    Comment: (NOTE) SARS-CoV-2 target nucleic acids are NOT DETECTED.  The SARS-CoV-2 RNA is generally detectable in upper respiratory specimens during the acute phase of infection. The lowest concentration of SARS-CoV-2 viral copies this assay can detect  is 138 copies/mL. A negative result does not preclude SARS-Cov-2 infection and should not  be used as the sole basis for treatment or other patient management decisions. A negative result may occur with  improper specimen collection/handling, submission of specimen other than nasopharyngeal swab, presence of viral mutation(s) within the areas targeted by this assay, and inadequate number of viral copies(<138 copies/mL). A negative result must be combined with clinical observations, patient history, and epidemiological information. The expected result is Negative.  Fact Sheet for Patients:  EntrepreneurPulse.com.au  Fact Sheet for Healthcare Providers:  IncredibleEmployment.be  This test is no t yet approved or cleared by the [place] FDA and  has been authorized for detection and/or diagnosis of SARS-CoV-2 by FDA under an Emergency Use Authorization (EUA). This EUA will remain  in effect (meaning this test can be used) for the duration of the COVID-19 declaration under Section 564(b)(1) of the Act, 21 U.S.C.section 360bbb-3(b)(1), unless the authorization is terminated  or revoked sooner.       Influenza A by PCR NEGATIVE NEGATIVE Final   Influenza B by PCR NEGATIVE NEGATIVE Final    Comment: (NOTE) The Xpert Xpress SARS-CoV-2/FLU/RSV plus assay is intended as an aid in the diagnosis of influenza from Nasopharyngeal swab specimens and should not be used as a sole basis for treatment. Nasal washings and aspirates are unacceptable for Xpert Xpress SARS-CoV-2/FLU/RSV testing.  Fact Sheet for Patients: EntrepreneurPulse.com.au  Fact Sheet for Healthcare Providers: IncredibleEmployment.be  This test is not yet approved or cleared by the [place] FDA and has been authorized for detection and/or diagnosis of SARS-CoV-2 by FDA under an Emergency Use Authorization (EUA). This EUA will remain in effect (meaning this test can be used) for the duration of the COVID-19 declaration under Section  564(b)(1) of the Act, 21 U.S.C. section 360bbb-3(b)(1), unless the authorization is terminated or revoked.  Performed at [facility], [place] [address] [place], [place] [address]   MRSA PCR Screening     Status: None   Collection Time: 07/14/20  6:29 PM   Specimen: Nasal Mucosa; Nasopharyngeal  Result Value Ref Range Status   MRSA by PCR NEGATIVE NEGATIVE Final    Comment:        The GeneXpert MRSA Assay (FDA approved for NASAL specimens only), is one component of a comprehensive MRSA colonization surveillance program. It is not intended to diagnose MRSA infection nor to guide or monitor treatment for MRSA infections. Performed at [facility], [place] [address] [place], [place] [address]      Studies: No results found.   Flora Lipps, MD  [facility] 07/18/2020  If 7PM-7AM, please contact night-coverage

## 2020-07-18 NOTE — Progress Notes (Addendum)
Writer attempted to titrate trach collar oxygen to 8 liters per Dr. Marianne Sofia request.  Patient unable to tolerate oxygen level  decreased to 86%,. Trach collar @ 10 liters 50%. Oxygen level 94%. Will continue titration as patient able to tolerate.

## 2020-07-18 NOTE — Discharge Summary (Incomplete)
Physician Discharge Summary  Steve [name] NLG:[id number] DOB: [birth year] DOA: 07/14/2020  PCP: [name], PA-C  Admit date: 07/14/2020 Discharge date: 07/18/2020  Admitted From: Home  Discharge disposition: Home   Recommendations for Outpatient Follow-Up:   . Follow up with your primary care provider in one week.  . Check CBC, BMP, magnesium in the next visit  Discharge Diagnosis:   Principal Problem:   Pneumonia Active Problems:   Hodgkin's lymphoma ([place])   Pressure injury of skin   Tracheostomy dependence ([place])   Chronic respiratory failure with hypoxia ([name])   Hodgkin's lymphoma ([place])   Bronchiectasis ([place])   DM (diabetes mellitus), type 2 ([place])    Discharge Condition: Improved.  Diet recommendation: Low sodium, heart healthy.  Carbohydrate-modified.  Regular.  Wound care: None.  Code status: Full.   History of Present Illness:  Steve [name] a 65 y.o.malewith medical history significant ofchronic hypoxic respiratory failure with chronic trach and O2 use, bronchiectasis, pulmonary fibrosis, anxiety presented to hospital with dyspnea and increased cough and productive yellow sputum.  In the ED, chest x-ray showed infiltrates superimposed on his chronic fibrosis. He was started on vanc and cefepime. RT suctioned his trach.  Patient was then admitted to the hospital for further evaluation and treatment.  Required extensive suctioning, so was transferred to stepdown unit.   Hospital Course:   Following conditions were addressed during hospitalization as listed below,  Bilateral superimposed possible bacterial pneumonia with underlying pulmonary fibrosis and bronchiectasis.   Patient was initially on vancomycin and cefepime.  MRSA screen was negative.  on cefepime and Flagyl for now due to possibility of anaerobic organisms and aim for 5 to 7-day course.  Respiratory panel was negative for influenza and Covid.  Blood cultures negative in 2 days.   Follow urinary antigen for Legionella and strep-pending..  Continue bronchodilators, guaifenesin, respiratory care for tracheostomy. Currently on 10 L of oxygen by mask at 60% FiO2.  Saturating 98%. Could potentially change to Augmentin on discharge to complete 5 to 7-day course.  Chronic respiratory failure on chronic O2 and trach. Continue trach care.  RT on board.  Continue frequent suctioning, albuterol inhaler.  Continue antibiotics.   Chronic PEG  Continue nutrition through the PEG tube and orally.  Tolerating PEG tube.  Normocytic anemia Likely anemia of chronic disease.  We will continue to follow closely.  Latest hemoglobin of 8.0  Anxiety On Ativan.  Continue Zoloft, trazodone  Diabetes mellitus type 2. Diet controlled at baseline.  Latest hemoglobin A1c on 05/25/2020 was 5.8.  Continue diabetic diet, Accu-Cheks.  Hypomagnesemia.  Latest magnesium 1.6.  Will replace with IV magnesium sulfate and p.o. magnesium oxide..check BMP in am.  GERD Continue PPI   Hodgkin's lymphoma Followed by Dr. Marin Olp as outpatient.  Seen by oncology during hospitalization.  Pressure injury of the buttocks stage II.  Present on admission. Continue wound care. Pressure Injury 07/14/20 Buttocks Medial Stage 2 -  Partial thickness loss of dermis presenting as a shallow open injury with a red, pink wound bed without slough. red, open skin (Active)  07/14/20 1700  Location: Buttocks  Location Orientation: Medial  Staging: Stage 2 -  Partial thickness loss of dermis presenting as a shallow open injury with a red, pink wound bed without slough.  Wound Description (Comments): red, open skin  Present on Admission: Yes    Disposition.  At this time, patient is stable for disposition  Medical Consultants:    Oncology  Procedures:  None Subjective:   Today, patient seen and examined at bedside.  Feels okay.  Denies any pain, nausea, vomiting, fever or chills  Discharge  Exam:   Vitals:   07/18/20 0801 07/18/20 0859  BP:  (!) 91/53  Pulse:  (!) 101  Resp:  (!) 22  Temp: 98.3 F (36.8 C)   SpO2:  97%   Vitals:   07/18/20 0400 07/18/20 0500 07/18/20 0801 07/18/20 0859  BP: (!) 87/38 (!) 102/52  (!) 91/53  Pulse: 87 91  (!) 101  Resp: 20 (!) 25  (!) 22  Temp:   98.3 F (36.8 C)   TempSrc:   Oral   SpO2: 97% 99%  97%  Weight:      Height:        General: Alert awake, not in obvious distress, thinly built, chronically ill, on trach collar HENT: pupils equally reacting to light,  No scleral pallor or icterus noted. Oral mucosa is moist.  Tracheostomy in place, Chest: Diminished breath sounds bilaterally. CVS: S1 &S2 heard. No murmur.  Regular rate and rhythm. Abdomen: Soft, nontender, nondistended.  Bowel sounds are heard.  PEG tube in place. Extremities: No cyanosis, clubbing or edema.  Peripheral pulses are palpable. Psych: Alert, awake and oriented, normal mood CNS:  No cranial nerve deficits.  Power equal in all extremities.   Skin: Warm and dry.  Sacral decubitus ulceration present on admission  The results of significant diagnostics from this hospitalization (including imaging, microbiology, ancillary and laboratory) are listed below for reference.     Diagnostic Studies:   DG Chest 2 View  Result Date: 07/14/2020 CLINICAL DATA:  Shortness of breath. EXAM: CHEST - 2 VIEW COMPARISON:  Chest x-ray 05/25/2020 FINDINGS: The tracheostomy tube is in good position, unchanged. The power port is in good position, unchanged. Stable mild cardiac enlargement and prominent mediastinal and hilar contours. Severe chronic underlying lung disease likely post pneumonic pulmonary fibrosis. Low lung volumes with vascular crowding and atelectasis. Slight increase in patchy airspace opacity suspicious for superimposed infiltrates. No definite pleural effusions. IMPRESSION: Severe chronic underlying lung disease with suspected superimposed infiltrates.  Electronically Signed   By: Marijo Sanes M.D.   On: 07/14/2020 14:13     Labs:   Basic Metabolic Panel: Recent Labs  Lab 07/14/20 1233 07/15/20 0244 07/16/20 0249 07/17/20 0330 07/18/20 0424  NA 136 141 139 140 137  K 3.7 4.1 4.1 4.0 4.5  CL 93* 99 98 99 95*  CO2 36* 35* 34* 36* 38*  GLUCOSE 148* 141* 158* 177* 183*  BUN 54* 41* 29* 26* 26*  CREATININE 0.87 0.71 0.75 0.63 0.76  CALCIUM 9.8 9.2 9.1 8.7* 8.8*  MG  --   --  1.7 1.6*  --   PHOS  --   --  2.8 2.7  --    GFR Estimated Creatinine Clearance: 79.8 mL/min (by C-G formula based on SCr of 0.76 mg/dL). Liver Function Tests: Recent Labs  Lab 07/14/20 1233 07/15/20 0244 07/16/20 0249  AST 21 19 19   ALT 21 18 18   ALKPHOS 54 51 46  BILITOT 1.0 0.6 0.5  PROT 7.8 7.1 6.6  ALBUMIN 2.8* 2.5* 2.3*   No results for input(s): LIPASE, AMYLASE in the last 168 hours. No results for input(s): AMMONIA in the last 168 hours. Coagulation profile No results for input(s): INR, PROTIME in the last 168 hours.  CBC: Recent Labs  Lab 07/14/20 1233 07/15/20 0244 07/16/20 0249 07/17/20 0330 07/18/20 0424  WBC 16.3*  16.4* 12.7* 12.3* 13.5*  NEUTROABS 13.9*  --   --   --   --   HGB 9.3* 8.5* 8.3* 8.0* 8.2*  HCT 31.1* 28.4* 28.3* 27.2* 28.3*  MCV 87.6 88.2 90.4 90.1 89.6  PLT 234 210 213 225 229   Cardiac Enzymes: No results for input(s): CKTOTAL, CKMB, CKMBINDEX, TROPONINI in the last 168 hours. BNP: Invalid input(s): POCBNP CBG: No results for input(s): GLUCAP in the last 168 hours. D-Dimer No results for input(s): DDIMER in the last 72 hours. Hgb A1c No results for input(s): HGBA1C in the last 72 hours. Lipid Profile No results for input(s): CHOL, HDL, LDLCALC, TRIG, CHOLHDL, LDLDIRECT in the last 72 hours. Thyroid function studies No results for input(s): TSH, T4TOTAL, T3FREE, THYROIDAB in the last 72 hours.  Invalid input(s): FREET3 Anemia work up No results for input(s): VITAMINB12, FOLATE, FERRITIN, TIBC,  IRON, RETICCTPCT in the last 72 hours. Microbiology Recent Results (from the past 240 hour(s))  Blood culture (routine x 2)     Status: None (Preliminary result)   Collection Time: 07/14/20 12:26 PM   Specimen: BLOOD  Result Value Ref Range Status   Specimen Description   Final    BLOOD RIGHT ANTECUBITAL Performed at [facility] [address] [place], [place] [address]    Special Requests   Final    BOTTLES DRAWN AEROBIC AND ANAEROBIC Blood Culture adequate volume Performed at [facility] [address] [place], [place] [address]    Culture   Final    NO GROWTH 3 DAYS Performed at [facility], [place] [address] [place], [place] [address]    Report Status PENDING  Incomplete  Blood culture (routine x 2)     Status: None (Preliminary result)   Collection Time: 07/14/20 12:33 PM   Specimen: BLOOD  Result Value Ref Range Status   Specimen Description   Final    BLOOD LEFT ANTECUBITAL Performed at [facility] [address] [place], [place] [address]    Special Requests   Final    BOTTLES DRAWN AEROBIC AND ANAEROBIC Blood Culture adequate volume Performed at [facility] [address], [place], [place] [address]    Culture   Final    NO GROWTH 3 DAYS Performed at [facility], [place] [address] [place], [place] [address]    Report Status PENDING  Incomplete  Resp Panel by RT-PCR (Flu A&B, Covid) Nasopharyngeal Swab     Status: None   Collection Time: 07/14/20  2:58 PM   Specimen: Nasopharyngeal Swab; Nasopharyngeal(NP) swabs in vial transport medium  Result Value Ref Range Status   SARS Coronavirus 2 by RT PCR NEGATIVE NEGATIVE Final    Comment: (NOTE) SARS-CoV-2 target nucleic acids are NOT DETECTED.  The SARS-CoV-2 RNA is generally detectable in upper respiratory specimens during the acute phase of infection. The lowest concentration of SARS-CoV-2 viral copies this assay can  detect is 138 copies/mL. A negative result does not preclude SARS-Cov-2 infection and should not be used as the sole basis for treatment or other patient management decisions. A negative result may occur with  improper specimen collection/handling, submission of specimen other than nasopharyngeal swab, presence of viral mutation(s) within the areas targeted by this assay, and inadequate number of viral copies(<138 copies/mL). A negative result must be combined with clinical observations, patient history, and epidemiological information. The expected result is Negative.  Fact Sheet for Patients:  EntrepreneurPulse.com.au  Fact Sheet  for Healthcare Providers:  IncredibleEmployment.be  This test is no t yet approved or cleared by the [place] and  has been authorized for detection and/or diagnosis of SARS-CoV-2 by FDA under an Emergency Use Authorization (EUA). This EUA will remain  in effect (meaning this test can be used) for the duration of the COVID-19 declaration under Section 564(b)(1) of the Act, 21 U.S.C.section 360bbb-3(b)(1), unless the authorization is terminated  or revoked sooner.       Influenza A by PCR NEGATIVE NEGATIVE Final   Influenza B by PCR NEGATIVE NEGATIVE Final    Comment: (NOTE) The Xpert Xpress SARS-CoV-2/FLU/RSV plus assay is intended as an aid in the diagnosis of influenza from Nasopharyngeal swab specimens and should not be used as a sole basis for treatment. Nasal washings and aspirates are unacceptable for Xpert Xpress SARS-CoV-2/FLU/RSV testing.  Fact Sheet for Patients: EntrepreneurPulse.com.au  Fact Sheet for Healthcare Providers: IncredibleEmployment.be  This test is not yet approved or cleared by the [place] FDA and has been authorized for detection and/or diagnosis of SARS-CoV-2 by FDA under an Emergency Use Authorization (EUA). This EUA will remain in  effect (meaning this test can be used) for the duration of the COVID-19 declaration under Section 564(b)(1) of the Act, 21 U.S.C. section 360bbb-3(b)(1), unless the authorization is terminated or revoked.  Performed at [facility], [place] [address], [place], [place] [address]   MRSA PCR Screening     Status: None   Collection Time: 07/14/20  6:29 PM   Specimen: Nasal Mucosa; Nasopharyngeal  Result Value Ref Range Status   MRSA by PCR NEGATIVE NEGATIVE Final    Comment:        The GeneXpert MRSA Assay (FDA approved for NASAL specimens only), is one component of a comprehensive MRSA colonization surveillance program. It is not intended to diagnose MRSA infection nor to guide or monitor treatment for MRSA infections. Performed at [facility], [place] [address], [place], [place] [address]      Discharge Instructions:   Discharge Instructions    Call MD for:  severe uncontrolled pain   Complete by: As directed    Call MD for:  temperature >100.4   Complete by: As directed    Diet - low sodium heart healthy   Complete by: As directed    Discharge instructions   Complete by: As directed    Follow-up with your primary care physician in 1 week.  Follow-up with Dr. Marin Olp oncology as scheduled by you.  Complete the course of antibiotic.  Continue suctioning and oxygen at home.   Discharge wound care:   Complete by: As directed    Pressure ulcer prevention protocol and care   Increase activity slowly   Complete by: As directed    No wound care   Complete by: As directed      Allergies as of 07/18/2020   No Known Allergies     Medication List    TAKE these medications   acetaminophen 160 MG/5ML solution Commonly known as: TYLENOL Place 20.3 mLs (650 mg total) into feeding tube every 4 (four) hours as needed for mild pain or fever (temp > 101.5).   albuterol (2.5 MG/3ML) 0.083% nebulizer solution Commonly known as: PROVENTIL Take 3  mLs (2.5 mg total) by nebulization every 2 (two) hours as needed for wheezing.   amoxicillin-clavulanate 875-125 MG tablet Commonly known as: Augmentin Take 1 tablet by mouth 2 (two) times daily for 5 days.  calamine lotion Apply topically as needed for itching.   chlorhexidine 0.12 % solution Commonly known as: PERIDEX 15 mLs by Mouth Rinse route 2 (two) times daily. What changed:   when to take this  reasons to take this   Chlorhexidine Gluconate Cloth 2 % Pads Apply 6 each topically daily.   enoxaparin 40 MG/0.4ML injection Commonly known as: LOVENOX Inject 0.4 mLs (40 mg total) into the skin daily.   feeding supplement (OSMOLITE 1.2 CAL) Liqd Place 1,000 mLs into feeding tube continuous. What changed:   how much to take  Another medication with the same name was removed. Continue taking this medication, and follow the directions you see here.   feeding supplement (PRO-STAT SUGAR FREE 64) Liqd Place 30 mLs into feeding tube daily.   free water Soln Place 100 mLs into feeding tube every 4 (four) hours. What changed: how much to take   furosemide 20 MG tablet Commonly known as: LASIX Place 1 tablet (20 mg total) into feeding tube daily.   guaiFENesin 100 MG/5ML Soln Commonly known as: ROBITUSSIN Place 5 mLs (100 mg total) into feeding tube every 4 (four) hours as needed for cough or to loosen phlegm.   lidocaine-prilocaine cream Commonly known as: EMLA Apply to affected area once What changed:   how much to take  how to take this  when to take this  reasons to take this   LORazepam 0.5 MG tablet Commonly known as: Ativan Take 1 tablet (0.5 mg total) by mouth every 6 (six) hours as needed (Nausea or vomiting). What changed: Another medication with the same name was removed. Continue taking this medication, and follow the directions you see here.   magnesium oxide 400 (241.3 Mg) MG tablet Commonly known as: MAG-OX Take 1 tablet (400 mg total) by  mouth 2 (two) times daily for 10 days.   multivitamin Liqd Place 15 mLs into feeding tube daily.   ondansetron 4 MG tablet Commonly known as: ZOFRAN Take 1 tablet (4 mg total) by mouth every 6 (six) hours as needed for nausea.   sertraline 50 MG tablet Commonly known as: ZOLOFT GIVE 1 TAB PER TUBE DAILY   traZODone 100 MG tablet Commonly known as: DESYREL Place 1 tablet (100 mg total) into feeding tube at bedtime.   triamcinolone cream 0.1 % Commonly known as: KENALOG Apply 1 application topically 2 (two) times daily as needed (itching).   Vitamin D3 10 MCG/ML Liqd Give 400 Units by tube daily. What changed: how to take this            Discharge Care Instructions  (From admission, onward)         Start     Ordered   07/18/20 0000  Discharge wound care:       Comments: Pressure ulcer prevention protocol and care   07/18/20 1018          Follow-up Information    [place], [name]. Schedule an appointment as soon as possible for a visit in 1 week(s).   Specialties: Internal Medicine, Family Medicine Contact information: [address] [place] [address] [phone]                Time coordinating discharge: 39 minutes  Signed:  Laxman Pokhrel  [facility] 07/18/2020, 10:19 AM

## 2020-07-18 NOTE — TOC Progression Note (Signed)
Transition of Care [facility]) - Progression Note    Patient Details  Name: Steve [name] MRN: [id number] Date of Birth: 1955-06-27  Transition of Care [facility]) CM/SW Contact  Joaquin Courts, RN Phone Number: 07/18/2020, 1:36 PM  Clinical Narrative:    CM spoke with [facility], agency has services patient in the past, unfortunately at this time agency is unable to re-accept patient for [facility] services.  CM spoke with patient's spouse and updated her about this.  [facility] will provide home health physical therapy services.  Patient will need PTAR transport st discharge.     Expected Discharge Plan: Home/Self Care Barriers to Discharge: Continued Medical Work up  Expected Discharge Plan and Services Expected Discharge Plan: Home/Self Care   Discharge Planning Services: CM Consult   Living arrangements for the past 2 months: Mobile Home Expected Discharge Date: 07/18/20                         HH Arranged: PT HH Agency: [facility] Date HH Agency Contacted: 07/18/20 Time [place]: [id number] Representative spoke with at [place]: [place] ([place]) Interventions    Readmission Risk Interventions Readmission Risk Prevention Plan 07/22/2019  Transportation Screening Complete  Medication Review Press photographer) Complete  Some recent data might be hidden

## 2020-07-19 DIAGNOSIS — J189 Pneumonia, unspecified organism: Secondary | ICD-10-CM | POA: Diagnosis not present

## 2020-07-19 DIAGNOSIS — J479 Bronchiectasis, uncomplicated: Secondary | ICD-10-CM | POA: Diagnosis not present

## 2020-07-19 DIAGNOSIS — J9611 Chronic respiratory failure with hypoxia: Secondary | ICD-10-CM | POA: Diagnosis not present

## 2020-07-19 DIAGNOSIS — E119 Type 2 diabetes mellitus without complications: Secondary | ICD-10-CM | POA: Diagnosis not present

## 2020-07-19 LAB — CBC
HCT: 29.5 % — ABNORMAL LOW (ref 39.0–52.0)
Hemoglobin: 8.5 g/dL — ABNORMAL LOW (ref 13.0–17.0)
MCH: 26.5 pg (ref 26.0–34.0)
MCHC: 28.8 g/dL — ABNORMAL LOW (ref 30.0–36.0)
MCV: 91.9 fL (ref 80.0–100.0)
Platelets: 232 10*3/uL (ref 150–400)
RBC: 3.21 MIL/uL — ABNORMAL LOW (ref 4.22–5.81)
RDW: 16.2 % — ABNORMAL HIGH (ref 11.5–15.5)
WBC: 13.8 10*3/uL — ABNORMAL HIGH (ref 4.0–10.5)
nRBC: 0 % (ref 0.0–0.2)

## 2020-07-19 LAB — CULTURE, BLOOD (ROUTINE X 2)
Culture: NO GROWTH
Culture: NO GROWTH
Special Requests: ADEQUATE
Special Requests: ADEQUATE

## 2020-07-19 LAB — BASIC METABOLIC PANEL
Anion gap: 7 (ref 5–15)
BUN: 24 mg/dL — ABNORMAL HIGH (ref 8–23)
CO2: 37 mmol/L — ABNORMAL HIGH (ref 22–32)
Calcium: 8.8 mg/dL — ABNORMAL LOW (ref 8.9–10.3)
Chloride: 92 mmol/L — ABNORMAL LOW (ref 98–111)
Creatinine, Ser: 0.77 mg/dL (ref 0.61–1.24)
GFR, Estimated: >60 mL/min (ref >60)
Glucose, Bld: 188 mg/dL — ABNORMAL HIGH (ref 70–99)
Potassium: 4.8 mmol/L (ref 3.5–5.1)
Sodium: 136 mmol/L (ref 135–145)

## 2020-07-19 LAB — MAGNESIUM: Magnesium: 2.2 mg/dL (ref 1.7–2.4)

## 2020-07-19 MED ORDER — SODIUM CHLORIDE 0.9 % IV BOLUS
500.0000 mL | Freq: Once | INTRAVENOUS | Status: AC
  Administered 2020-07-19: 500 mL via INTRAVENOUS

## 2020-07-19 MED ORDER — SODIUM CHLORIDE 0.9 % IV SOLN: INTRAVENOUS | Status: DC

## 2020-07-19 MED ORDER — LORAZEPAM 0.5 MG PO TABS: 0.5000 mg | ORAL_TABLET | Freq: Four times a day (QID) | ORAL | Status: DC | PRN

## 2020-07-19 NOTE — Progress Notes (Addendum)
PROGRESS NOTE  Steve [name] PTW:[id number] DOB: 08-30-1955 DOA: 07/14/2020 PCP: [name], PA-C   LOS: 5 days   Brief narrative:  Steve [name] is a 65 y.o. male with medical history significant of chronic hypoxic respiratory failure with chronic trach and O2 use, bronchiectasis, pulmonary fibrosis, anxiety presented to hospital with dyspnea and increased cough and productive yellow sputum.  In the ED, chest x-ray showed infiltrates superimposed on his chronic fibrosis. He was started on vanc and cefepime. RT suctioned his trach.  Patient was then admitted to the hospital for further evaluation and treatment.  Required extensive suctioning, so was transferred to stepdown unit.  Assessment/Plan:  Principal Problem:   Pneumonia Active Problems:   Hodgkin's lymphoma ([place])   Pressure injury of skin   Tracheostomy dependence ([name])   Chronic respiratory failure with hypoxia ([name])   Hodgkin's lymphoma ([place])   Bronchiectasis ([name])   DM (diabetes mellitus), type 2 ([place])  Bilateral superimposed possible bacterial pneumonia with underlying pulmonary fibrosis and bronchiectasis.   Patient was initially on vancomycin and cefepime.  MRSA screen was negative.  On cefepime and Flagyl for now due to possibility of anaerobic organisms and aim for 5 to 7-day course.  Respiratory panel was negative for influenza and Covid.    Follow urinary antigen for Legionella and strep-pending..  Continue bronchodilators, guaifenesin, respiratory care for tracheostomy. Currently on 10 L of oxygen by mask at 50% FiO2.  Saturating 95%. Will change to Augmentin on discharge to complete 5 to 7-day course.  Blood cultures negative in 5 days.  Patient required up to 80% FiO2 this morning and had hypotension.  Mild leukocytosis at 13.8.  We will continue to monitor.  Chronic respiratory failure on chronic O2 and trach. Continue trach care.  RT on board.  Continue frequent suctioning, albuterol inhaler.  Continue  antibiotics.   Chronic PEG      Continue nutrition through the PEG tube and orally.  Tolerating PEG tube.  Normocytic anemia Likely anemia of chronic disease.  We will continue to follow closely.  Latest hemoglobin of 8.5.  Anxiety   Continue Zoloft, trazodone   Diabetes mellitus type 2. Diet controlled at baseline.  Latest hemoglobin A1c on 05/25/2020 was 5.8.  Continue diabetic diet, Accu-Cheks.  Hypomagnesemia.  Replenished.  Continue p.o. magnesium oxide.  Hypotension today.  We will give a 500 mL of normal saline bolus.  We will continue to monitor blood pressure closely.  GERD Continue PPI   Hodgkin's lymphoma     Followed by Dr. Marin Olp as outpatient.  Seen by oncology during hospitalization.  Pressure injury of the buttocks stage II.  Present on admission. Continue wound care.  Pressure Injury 07/14/20 Buttocks Medial Stage 2 -  Partial thickness loss of dermis presenting as a shallow open injury with a red, pink wound bed without slough. red, open skin (Active)  07/14/20 1700  Location: Buttocks  Location Orientation: Medial  Staging: Stage 2 -  Partial thickness loss of dermis presenting as a shallow open injury with a red, pink wound bed without slough.  Wound Description (Comments): red, open skin  Present on Admission: Yes    DVT prophylaxis: enoxaparin (LOVENOX) injection 40 mg Start: 07/14/20 2200  Code Status: Full code  Family Communication:  Spoke with the patient's spouse on the phone and updated her about the clinical condition of the patient.  Status is: Inpatient  Remains inpatient appropriate because:IV treatments appropriate due to intensity of illness or inability to take PO  and Inpatient level of care appropriate due to severity of illness, IV antibiotics, frequent tracheal suctioning.   Dispo: The patient is from: Home              Anticipated d/c is to: Home with home health likely tomorrow.  Was a little hypotensive and required higher  FiO2 today.              Patient currently is not medically stable to d/c.   Difficult to place patient No  Consultants:  [facility]-oncology  Procedures:  None  Anti-infectives:  Marland Kitchen Vancomycin 4/5>4/6 .  cefepime 4/5> . Metronidazole 4/6>  Anti-infectives (From admission, onward)   Start     Dose/Rate Route Frequency Ordered Stop   07/18/20 0000  amoxicillin-clavulanate (AUGMENTIN) 875-125 MG tablet        1 tablet Oral 2 times daily 07/18/20 1018 07/23/20 2359   07/15/20 1200  metroNIDAZOLE (FLAGYL) IVPB 500 mg        500 mg 100 mL/hr over 60 Minutes Intravenous Every 8 hours 07/15/20 1024     07/15/20 0300  vancomycin (VANCOREADY) IVPB 750 mg/150 mL  Status:  Discontinued        750 mg 150 mL/hr over 60 Minutes Intravenous Every 12 hours 07/14/20 1613 07/15/20 1025   07/14/20 2200  ceFEPIme (MAXIPIME) 2 g in sodium chloride 0.9 % 100 mL IVPB        2 g 200 mL/hr over 30 Minutes Intravenous Every 8 hours 07/14/20 1613     07/14/20 1400  vancomycin (VANCOREADY) IVPB 1250 mg/250 mL        1,250 mg 166.7 mL/hr over 90 Minutes Intravenous STAT 07/14/20 1354 07/14/20 1627   07/14/20 1330  ceFEPIme (MAXIPIME) 2 g in sodium chloride 0.9 % 100 mL IVPB        2 g 200 mL/hr over 30 Minutes Intravenous  Once 07/14/20 1318 07/14/20 1440      Subjective: Today, patient was seen and examined at bedside.  Denies any pain, nausea, vomiting, fever, chills or rigor.  Patient required increased FiO2 this morning.  Was little hypotensive today and mildly somnolent at the time of my evaluation.    Objective: Vitals:   07/19/20 1100 07/19/20 1153  BP: (!) 84/49   Pulse: 78 (!) 103  Resp: (!) 25 (!) 33  Temp:    SpO2: 100% 91%    Intake/Output Summary (Last 24 hours) at 07/19/2020 1254 Last data filed at 07/19/2020 0900 Gross per 24 hour  Intake 3552.73 ml  Output 1375 ml  Net 2177.73 ml   Filed Weights   07/14/20 1352 07/15/20 0045  Weight: 62.1 kg 60.5 kg   Body mass index is  21.53 kg/m.   Physical Exam:  General: Thinly built, not in obvious distress, chronically ill on trach collar at 10 L/min, mildly somnolent HENT:   No scleral pallor or icterus noted. Oral mucosa is moist.  Chest:   Diminished breath sounds bilaterally.  Coarse breath sounds noted. CVS: S1 &S2 heard. No murmur.  Regular rate and rhythm. Abdomen: Soft, nontender, nondistended.  Bowel sounds are heard.  PEG tube in place without induration erythema. Extremities: No cyanosis, clubbing or edema.  Peripheral pulses are palpable. Psych: somnolent today mildly anxious, CNS:  No cranial nerve deficits.  Moves all extremities. Skin: Warm and dry.  No rashes noted.  Data Review: I have personally reviewed the following laboratory data and studies,  CBC: Recent Labs  Lab 07/14/20 1233 07/15/20 0244 07/16/20  1962 07/17/20 0330 07/18/20 0424 07/19/20 0545  WBC 16.3* 16.4* 12.7* 12.3* 13.5* 13.8*  NEUTROABS 13.9*  --   --   --   --   --   HGB 9.3* 8.5* 8.3* 8.0* 8.2* 8.5*  HCT 31.1* 28.4* 28.3* 27.2* 28.3* 29.5*  MCV 87.6 88.2 90.4 90.1 89.6 91.9  PLT 234 210 213 225 229 229   Basic Metabolic Panel: Recent Labs  Lab 07/15/20 0244 07/16/20 0249 07/17/20 0330 07/18/20 0424 07/19/20 0545  NA 141 139 140 137 136  K 4.1 4.1 4.0 4.5 4.8  CL 99 98 99 95* 92*  CO2 35* 34* 36* 38* 37*  GLUCOSE 141* 158* 177* 183* 188*  BUN 41* 29* 26* 26* 24*  CREATININE 0.71 0.75 0.63 0.76 0.77  CALCIUM 9.2 9.1 8.7* 8.8* 8.8*  MG  --  1.7 1.6*  --  2.2  PHOS  --  2.8 2.7  --   --    Liver Function Tests: Recent Labs  Lab 07/14/20 1233 07/15/20 0244 07/16/20 0249  AST 21 19 19   ALT 21 18 18   ALKPHOS 54 51 46  BILITOT 1.0 0.6 0.5  PROT 7.8 7.1 6.6  ALBUMIN 2.8* 2.5* 2.3*   No results for input(s): LIPASE, AMYLASE in the last 168 hours. No results for input(s): AMMONIA in the last 168 hours. Cardiac Enzymes: No results for input(s): CKTOTAL, CKMB, CKMBINDEX, TROPONINI in the last 168  hours. BNP (last 3 results) No results for input(s): BNP in the last 8760 hours.  ProBNP (last 3 results) No results for input(s): PROBNP in the last 8760 hours.  CBG: No results for input(s): GLUCAP in the last 168 hours. Recent Results (from the past 240 hour(s))  Blood culture (routine x 2)     Status: None   Collection Time: 07/14/20 12:26 PM   Specimen: BLOOD  Result Value Ref Range Status   Specimen Description   Final    BLOOD RIGHT ANTECUBITAL Performed at [facility] [address] [place], [place] [address]    Special Requests   Final    BOTTLES DRAWN AEROBIC AND ANAEROBIC Blood Culture adequate volume Performed at [facility] [address] [place], [place] [address]    Culture   Final    NO GROWTH 5 DAYS Performed at [facility], [place] [address], [place], [place] [address]    Report Status 07/19/2020 FINAL  Final  Blood culture (routine x 2)     Status: None   Collection Time: 07/14/20 12:33 PM   Specimen: BLOOD  Result Value Ref Range Status   Specimen Description   Final    BLOOD LEFT ANTECUBITAL Performed at [facility] [address] [place], [place] [address]    Special Requests   Final    BOTTLES DRAWN AEROBIC AND ANAEROBIC Blood Culture adequate volume Performed at [facility] [address] [place], [place] [address]    Culture   Final    NO GROWTH 5 DAYS Performed at [facility], [place] [address], [place],  [address]    Report Status 07/19/2020 FINAL  Final  Resp Panel by RT-PCR (Flu A&B, Covid) Nasopharyngeal Swab     Status: None   Collection Time: 07/14/20  2:58 PM   Specimen: Nasopharyngeal Swab; Nasopharyngeal(NP) swabs in vial transport medium  Result Value Ref Range Status   SARS Coronavirus 2 by RT PCR NEGATIVE NEGATIVE Final    Comment: (  NOTE) SARS-CoV-2 target nucleic acids are NOT DETECTED.  The SARS-CoV-2 RNA is generally  detectable in upper respiratory specimens during the acute phase of infection. The lowest concentration of SARS-CoV-2 viral copies this assay can detect is 138 copies/mL. A negative result does not preclude SARS-Cov-2 infection and should not be used as the sole basis for treatment or other patient management decisions. A negative result may occur with  improper specimen collection/handling, submission of specimen other than nasopharyngeal swab, presence of viral mutation(s) within the areas targeted by this assay, and inadequate number of viral copies(<138 copies/mL). A negative result must be combined with clinical observations, patient history, and epidemiological information. The expected result is Negative.  Fact Sheet for Patients:  EntrepreneurPulse.com.au  Fact Sheet for Healthcare Providers:  IncredibleEmployment.be  This test is no t yet approved or cleared by the [place] FDA and  has been authorized for detection and/or diagnosis of SARS-CoV-2 by FDA under an Emergency Use Authorization (EUA). This EUA will remain  in effect (meaning this test can be used) for the duration of the COVID-19 declaration under Section 564(b)(1) of the Act, 21 U.S.C.section 360bbb-3(b)(1), unless the authorization is terminated  or revoked sooner.       Influenza A by PCR NEGATIVE NEGATIVE Final   Influenza B by PCR NEGATIVE NEGATIVE Final    Comment: (NOTE) The Xpert Xpress SARS-CoV-2/FLU/RSV plus assay is intended as an aid in the diagnosis of influenza from Nasopharyngeal swab specimens and should not be used as a sole basis for treatment. Nasal washings and aspirates are unacceptable for Xpert Xpress SARS-CoV-2/FLU/RSV testing.  Fact Sheet for Patients: EntrepreneurPulse.com.au  Fact Sheet for Healthcare Providers: IncredibleEmployment.be  This test is not yet approved or cleared by the [place] FDA  and has been authorized for detection and/or diagnosis of SARS-CoV-2 by FDA under an Emergency Use Authorization (EUA). This EUA will remain in effect (meaning this test can be used) for the duration of the COVID-19 declaration under Section 564(b)(1) of the Act, 21 U.S.C. section 360bbb-3(b)(1), unless the authorization is terminated or revoked.  Performed at [facility], [place] [address], [place], [place] [address]   MRSA PCR Screening     Status: None   Collection Time: 07/14/20  6:29 PM   Specimen: Nasal Mucosa; Nasopharyngeal  Result Value Ref Range Status   MRSA by PCR NEGATIVE NEGATIVE Final    Comment:        The GeneXpert MRSA Assay (FDA approved for NASAL specimens only), is one component of a comprehensive MRSA colonization surveillance program. It is not intended to diagnose MRSA infection nor to guide or monitor treatment for MRSA infections. Performed at [facility], [place] [address], [place],  [address]      Studies: No results found.   Flora Lipps, MD  [facility] 07/19/2020  If 7PM-7AM, please contact night-coverage

## 2020-07-19 NOTE — Progress Notes (Signed)
07/19/2020 O2 sats low between midnight and 0100, frequently into mid to upper 80s while at rest. No significant change with suctioning. Conferred with RT and increased FiO2 to 80% with improvement into low 90's.  Improved O2 sats up to 100% after 0200 hours, following repositioning of pulse oximeter to toe. Attempted to decrease FiO2 to previous level of 60% but had resultant drop of O2 sats back into the 80s. Discussed with RT and returned FiO2 to 80%. Cindy S. Brigitte Pulse BSN, RN, [place] 07/19/2020 2:55 AM

## 2020-07-20 ENCOUNTER — Other Ambulatory Visit: Payer: Self-pay

## 2020-07-20 ENCOUNTER — Telehealth: Payer: Self-pay | Admitting: Medical

## 2020-07-20 DIAGNOSIS — J9611 Chronic respiratory failure with hypoxia: Secondary | ICD-10-CM | POA: Diagnosis not present

## 2020-07-20 DIAGNOSIS — J189 Pneumonia, unspecified organism: Secondary | ICD-10-CM | POA: Diagnosis not present

## 2020-07-20 DIAGNOSIS — J479 Bronchiectasis, uncomplicated: Secondary | ICD-10-CM | POA: Diagnosis not present

## 2020-07-20 DIAGNOSIS — E119 Type 2 diabetes mellitus without complications: Secondary | ICD-10-CM | POA: Diagnosis not present

## 2020-07-20 LAB — CBC
HCT: 26.5 % — ABNORMAL LOW (ref 39.0–52.0)
Hemoglobin: 7.7 g/dL — ABNORMAL LOW (ref 13.0–17.0)
MCH: 26.2 pg (ref 26.0–34.0)
MCHC: 29.1 g/dL — ABNORMAL LOW (ref 30.0–36.0)
MCV: 90.1 fL (ref 80.0–100.0)
Platelets: 215 10*3/uL (ref 150–400)
RBC: 2.94 MIL/uL — ABNORMAL LOW (ref 4.22–5.81)
RDW: 16.3 % — ABNORMAL HIGH (ref 11.5–15.5)
WBC: 13 10*3/uL — ABNORMAL HIGH (ref 4.0–10.5)
nRBC: 0 % (ref 0.0–0.2)

## 2020-07-20 LAB — BASIC METABOLIC PANEL
Anion gap: 4 — ABNORMAL LOW (ref 5–15)
BUN: 21 mg/dL (ref 8–23)
CO2: 36 mmol/L — ABNORMAL HIGH (ref 22–32)
Calcium: 8.6 mg/dL — ABNORMAL LOW (ref 8.9–10.3)
Chloride: 97 mmol/L — ABNORMAL LOW (ref 98–111)
Creatinine, Ser: 0.66 mg/dL (ref 0.61–1.24)
GFR, Estimated: >60 mL/min (ref >60)
Glucose, Bld: 164 mg/dL — ABNORMAL HIGH (ref 70–99)
Potassium: 4.7 mmol/L (ref 3.5–5.1)
Sodium: 137 mmol/L (ref 135–145)

## 2020-07-20 LAB — MAGNESIUM: Magnesium: 1.9 mg/dL (ref 1.7–2.4)

## 2020-07-20 LAB — PHOSPHORUS: Phosphorus: 2.4 mg/dL — ABNORMAL LOW (ref 2.5–4.6)

## 2020-07-20 MED ORDER — LOPERAMIDE HCL 1 MG/7.5ML PO SUSP: 2.0000 mg | ORAL | 0 refills | Status: AC | PRN

## 2020-07-20 MED ORDER — HEPARIN SOD (PORK) LOCK FLUSH 100 UNIT/ML IV SOLN
500.0000 [IU] | INTRAVENOUS | Status: AC | PRN
  Administered 2020-07-20: 500 [IU]

## 2020-07-20 MED ORDER — K PHOS MONO-SOD PHOS DI & MONO 155-852-130 MG PO TABS
500.0000 mg | ORAL_TABLET | Freq: Once | ORAL | Status: AC
  Administered 2020-07-20: 500 mg
  Filled 2020-07-20: qty 2

## 2020-07-20 NOTE — Telephone Encounter (Signed)
Medication:  albuterol (PROVENTIL) (2.5 MG/3ML) 0.083% nebulizer solution [[id number]]    He also need blood thinner per wife   Has the patient contacted their pharmacy? No. (If no, request that the patient contact the pharmacy for the refill.) (If yes, when and what did the pharmacy advise?)  Preferred Pharmacy (with phone number or street name):  [id number] - [place], [address] [place] [address]  Phone:  [phone] Fax:  [phone]  DEA #:  [id number]  [place] Reason: --     Agent: Please be advised that RX refills may take up to 3 business days. We ask that you follow-up with your pharmacy.

## 2020-07-20 NOTE — Progress Notes (Signed)
PHARMACY NOTE -  Cefepime  Pharmacy has been assisting with dosing of cefepime for PNA.  Dosage remains stable at 2g IV q8 hr and further renal adjustments per institutional Pharmacy antibiotic protocol  Pharmacy will sign off, following peripherally for culture results or dose adjustments. Please reconsult if a change in clinical status warrants re-evaluation of dosage.  Reuel Boom, PharmD, BCPS [phone] 07/20/2020, 8:35 AM

## 2020-07-20 NOTE — Progress Notes (Signed)
Discharge instructions read and explained to patient. Patient verbalized understanding. IV team de-accessed Port-a-cath. Took patient off of monitor. Made sure patient had proper equipment at home. Patient was taken home with PTAR.

## 2020-07-20 NOTE — Telephone Encounter (Signed)
I already sent in his lovenox. So why am I getting the request? Will you ask wife? Also already sent in albuterol solution.   Let me know why asking for duplicate rx?

## 2020-07-20 NOTE — Telephone Encounter (Signed)
Pt needs follow up in one week. He will needs labs.  So in office or video visit.  Better in office since he needs labs cbc, cmp, mag and phosphate.  Would like to listen to lungs as reason admitted was pneumonia.

## 2020-07-20 NOTE — Discharge Summary (Signed)
Physician Discharge Summary  Steve [name] ZJQ:[id number] DOB: October 28, 1955 DOA: 07/14/2020  PCP: [name], PA-C  Admit date: 07/14/2020 Discharge date: 07/20/2020  Admitted From: Home  Discharge disposition: Home with home health  Recommendations for Outpatient Follow-Up:   . Follow up with your primary care provider in one week.  . Follow-up with your oncology as scheduled by you. Marland Kitchen Check CBC, BMP, magnesium, phosphate in the next visit  Discharge Diagnosis:   Principal Problem:   Pneumonia Active Problems:   Hodgkin's lymphoma ([place])   Pressure injury of skin   Tracheostomy dependence ([name])   Chronic respiratory failure with hypoxia ([name])   Hodgkin's lymphoma ([place])   Bronchiectasis ([place])   DM (diabetes mellitus), type 2 ([place])   Discharge Condition: Improved.  Diet recommendation: Low sodium, heart healthy.  Carbohydrate-modified.    Wound care: Pressure injury care.  Code status: Full.   History of Present Illness:  Steve [name] a 64 y.o.malewith medical history significant ofchronic hypoxic respiratory failure with chronic trach and O2 use, bronchiectasis, pulmonary fibrosis, anxiety presented to hospital with dyspnea and increased cough and productive yellow sputum. In the ED, chest x-ray showed infiltrates superimposed on his chronic fibrosis. He was started on vanc and cefepime. RT suctioned his trach. Patient was then admitted to the hospital for further evaluation and treatment. Required extensive suctioning,so was transferred to stepdown unit.  Hospital Course:   Following conditions were addressed during hospitalization as listed below,  Bilateral superimposedpossible bacterialpneumonia with underlying pulmonary fibrosis and bronchiectasis. Patient was initially on vancomycin and cefepime. MRSA screen wasnegative.  Patient was continued on cefepime and Flagyl subsequently and was changed to Augmentin on discharge. Respiratory  panel was negative for influenza and Covid. Blood cultures negative.patient underwent frequent suctioning during hospitalization.  Currently at baseline functioning level and oxygen requirement.    Chronic respiratory failureon chronic O2 and trach. Continue trach care.  After discharge including frequent suctioning and broncho-dilators.  Chronic PEG  Continue nutrition through the PEG tube and orally.Tolerating PEG tube.  Normocytic anemia Likely anemia of chronic disease. We will continue to follow closely. Latest hemoglobin prior to discharge was 7.7.  Anxiety Continue Zoloft, trazodone  Diabetes mellitus type 2. Diet controlled at baseline. Latest hemoglobin A1c on 05/25/2020 was 5.8.   Hypomagnesemia. Improved with replacement.  Hodgkin's lymphoma Followed by Dr. Marin Olp as outpatient. Seen by oncology during hospitalization.  Pressure injury of the buttocks stage II. Present on admission. Continue wound care. Pressure Injury 07/14/20 Buttocks Medial Stage 2 -  Partial thickness loss of dermis presenting as a shallow open injury with a red, pink wound bed without slough. red, open skin (Active)  07/14/20 1700  Location: Buttocks  Location Orientation: Medial  Staging: Stage 2 -  Partial thickness loss of dermis presenting as a shallow open injury with a red, pink wound bed without slough.  Wound Description (Comments): red, open skin  Present on Admission: Yes   Disposition.  At this time, patient is stable for disposition home with outpatient PCP and oncology follow-up.  Spoke with the patient's spouse yesterday regarding disposition.  Medical Consultants:    Oncology  Procedures:    None Subjective:   Today, patient feels overall better.  Sometimes feels little dizzy when he closes his eyes and on sitting up position.  Patient denies increasing cough chest pain fevers or chills.  Discharge Exam:   Vitals:   07/20/20 0512 07/20/20 0600   BP:  (!) 109/50  Pulse:  88  Resp:  20  Temp: 98.1 F (36.7 C)   SpO2:  95%   Vitals:   07/20/20 0400 07/20/20 0500 07/20/20 0512 07/20/20 0600  BP: (!) 100/55 (!) 93/44  (!) 109/50  Pulse: 75 98  88  Resp: 16 20  20   Temp:   98.1 F (36.7 C)   TempSrc:   Oral   SpO2: 93% 93%  95%  Weight:      Height:       General: Alert awake, not in obvious distress, thinly built, chronically ill, on trach collar HENT: pupils equally reacting to light,  No scleral pallor or icterus noted. Oral mucosa is moist.  Chest:    Diminished breath sounds bilaterally.  Coarse breath sounds noted CVS: S1 &S2 heard. No murmur.  Regular rate and rhythm. Abdomen: Soft, nontender, nondistended.  Bowel sounds are heard.   Extremities: No cyanosis, clubbing or edema.  Peripheral pulses are palpable. Psych: Alert, awake and oriented, mildly anxious CNS:  No cranial nerve deficits.  Power equal in all extremities.   Skin: Warm and dry.  Sacral decubitus ulceration present on admission.  The results of significant diagnostics from this hospitalization (including imaging, microbiology, ancillary and laboratory) are listed below for reference.     Diagnostic Studies:   DG Chest 2 View  Result Date: 07/14/2020 CLINICAL DATA:  Shortness of breath. EXAM: CHEST - 2 VIEW COMPARISON:  Chest x-ray 05/25/2020 FINDINGS: The tracheostomy tube is in good position, unchanged. The power port is in good position, unchanged. Stable mild cardiac enlargement and prominent mediastinal and hilar contours. Severe chronic underlying lung disease likely post pneumonic pulmonary fibrosis. Low lung volumes with vascular crowding and atelectasis. Slight increase in patchy airspace opacity suspicious for superimposed infiltrates. No definite pleural effusions. IMPRESSION: Severe chronic underlying lung disease with suspected superimposed infiltrates. Electronically Signed   By: Marijo Sanes M.D.   On: 07/14/2020 14:13     Labs:    Basic Metabolic Panel: Recent Labs  Lab 07/16/20 0249 07/17/20 0330 07/18/20 0424 07/19/20 0545 07/20/20 0559  NA 139 140 137 136 137  K 4.1 4.0 4.5 4.8 4.7  CL 98 99 95* 92* 97*  CO2 34* 36* 38* 37* 36*  GLUCOSE 158* 177* 183* 188* 164*  BUN 29* 26* 26* 24* 21  CREATININE 0.75 0.63 0.76 0.77 0.66  CALCIUM 9.1 8.7* 8.8* 8.8* 8.6*  MG 1.7 1.6*  --  2.2 1.9  PHOS 2.8 2.7  --   --  2.4*   GFR Estimated Creatinine Clearance: 79.8 mL/min (by C-G formula based on SCr of 0.66 mg/dL). Liver Function Tests: Recent Labs  Lab 07/14/20 1233 07/15/20 0244 07/16/20 0249  AST 21 19 19   ALT 21 18 18   ALKPHOS 54 51 46  BILITOT 1.0 0.6 0.5  PROT 7.8 7.1 6.6  ALBUMIN 2.8* 2.5* 2.3*   No results for input(s): LIPASE, AMYLASE in the last 168 hours. No results for input(s): AMMONIA in the last 168 hours. Coagulation profile No results for input(s): INR, PROTIME in the last 168 hours.  CBC: Recent Labs  Lab 07/14/20 1233 07/15/20 0244 07/16/20 0249 07/17/20 0330 07/18/20 0424 07/19/20 0545 07/20/20 0559  WBC 16.3*   < > 12.7* 12.3* 13.5* 13.8* 13.0*  NEUTROABS 13.9*  --   --   --   --   --   --   HGB 9.3*   < > 8.3* 8.0* 8.2* 8.5* 7.7*  HCT 31.1*   < > 28.3* 27.2* 28.3*  29.5* 26.5*  MCV 87.6   < > 90.4 90.1 89.6 91.9 90.1  PLT 234   < > 213 225 229 232 215   < > = values in this interval not displayed.   Cardiac Enzymes: No results for input(s): CKTOTAL, CKMB, CKMBINDEX, TROPONINI in the last 168 hours. BNP: Invalid input(s): POCBNP CBG: No results for input(s): GLUCAP in the last 168 hours. D-Dimer No results for input(s): DDIMER in the last 72 hours. Hgb A1c No results for input(s): HGBA1C in the last 72 hours. Lipid Profile No results for input(s): CHOL, HDL, LDLCALC, TRIG, CHOLHDL, LDLDIRECT in the last 72 hours. Thyroid function studies No results for input(s): TSH, T4TOTAL, T3FREE, THYROIDAB in the last 72 hours.  Invalid input(s): FREET3 Anemia work up No  results for input(s): VITAMINB12, FOLATE, FERRITIN, TIBC, IRON, RETICCTPCT in the last 72 hours. Microbiology Recent Results (from the past 240 hour(s))  Blood culture (routine x 2)     Status: None   Collection Time: 07/14/20 12:26 PM   Specimen: BLOOD  Result Value Ref Range Status   Specimen Description   Final    BLOOD RIGHT ANTECUBITAL Performed at [facility] [address] [place], [place] [address]    Special Requests   Final    BOTTLES DRAWN AEROBIC AND ANAEROBIC Blood Culture adequate volume Performed at [facility] [address] [place], [place] [address]    Culture   Final    NO GROWTH 5 DAYS Performed at [facility], [place] [address] [place], [place] [address]    Report Status 07/19/2020 FINAL  Final  Blood culture (routine x 2)     Status: None   Collection Time: 07/14/20 12:33 PM   Specimen: BLOOD  Result Value Ref Range Status   Specimen Description   Final    BLOOD LEFT ANTECUBITAL Performed at [facility] [address] [place], [place] [address]    Special Requests   Final    BOTTLES DRAWN AEROBIC AND ANAEROBIC Blood Culture adequate volume Performed at [facility] [address], [place], [place] [address]    Culture   Final    NO GROWTH 5 DAYS Performed at [facility], [place] [address], [place],  [address]    Report Status 07/19/2020 FINAL  Final  Resp Panel by RT-PCR (Flu A&B, Covid) Nasopharyngeal Swab     Status: None   Collection Time: 07/14/20  2:58 PM   Specimen: Nasopharyngeal Swab; Nasopharyngeal(NP) swabs in vial transport medium  Result Value Ref Range Status   SARS Coronavirus 2 by RT PCR NEGATIVE NEGATIVE Final    Comment: (NOTE) SARS-CoV-2 target nucleic acids are NOT DETECTED.  The SARS-CoV-2 RNA is generally detectable in upper respiratory specimens during the acute phase of infection. The lowest concentration of SARS-CoV-2 viral  copies this assay can detect is 138 copies/mL. A negative result does not preclude SARS-Cov-2 infection and should not be used as the sole basis for treatment or other patient management decisions. A negative result may occur with  improper specimen collection/handling, submission of specimen other than nasopharyngeal swab, presence of viral mutation(s) within the areas targeted by this assay, and inadequate number of viral copies(<138 copies/mL). A negative result must be combined with clinical observations, patient history, and epidemiological information. The expected result is Negative.  Fact Sheet for Patients:  EntrepreneurPulse.com.au  Fact Sheet for Healthcare Providers:  IncredibleEmployment.be  This test is no t yet  approved or cleared by the [place] and  has been authorized for detection and/or diagnosis of SARS-CoV-2 by FDA under an Emergency Use Authorization (EUA). This EUA will remain  in effect (meaning this test can be used) for the duration of the COVID-19 declaration under Section 564(b)(1) of the Act, 21 U.S.C.section 360bbb-3(b)(1), unless the authorization is terminated  or revoked sooner.       Influenza A by PCR NEGATIVE NEGATIVE Final   Influenza B by PCR NEGATIVE NEGATIVE Final    Comment: (NOTE) The Xpert Xpress SARS-CoV-2/FLU/RSV plus assay is intended as an aid in the diagnosis of influenza from Nasopharyngeal swab specimens and should not be used as a sole basis for treatment. Nasal washings and aspirates are unacceptable for Xpert Xpress SARS-CoV-2/FLU/RSV testing.  Fact Sheet for Patients: EntrepreneurPulse.com.au  Fact Sheet for Healthcare Providers: IncredibleEmployment.be  This test is not yet approved or cleared by the [place] FDA and has been authorized for detection and/or diagnosis of SARS-CoV-2 by FDA under an Emergency Use Authorization (EUA). This  EUA will remain in effect (meaning this test can be used) for the duration of the COVID-19 declaration under Section 564(b)(1) of the Act, 21 U.S.C. section 360bbb-3(b)(1), unless the authorization is terminated or revoked.  Performed at [facility], [place] [address], [place], [place] [address]   MRSA PCR Screening     Status: None   Collection Time: 07/14/20  6:29 PM   Specimen: Nasal Mucosa; Nasopharyngeal  Result Value Ref Range Status   MRSA by PCR NEGATIVE NEGATIVE Final    Comment:        The GeneXpert MRSA Assay (FDA approved for NASAL specimens only), is one component of a comprehensive MRSA colonization surveillance program. It is not intended to diagnose MRSA infection nor to guide or monitor treatment for MRSA infections. Performed at [facility], [place] [address] [place], [place] [address]      Discharge Instructions:   Discharge Instructions    Call MD for:  severe uncontrolled pain   Complete by: As directed    Call MD for:  temperature >100.4   Complete by: As directed    Diet - low sodium heart healthy   Complete by: As directed    Discharge instructions   Complete by: As directed    Follow-up with your primary care physician in 1 week.  Follow-up with Dr. Marin Olp oncology as scheduled by you.  Complete the course of antibiotic.  Continue suctioning and oxygen at home.  Take time to change positions especially from lying down to standing up/sitting up position.  Increase hydration   Discharge wound care:   Complete by: As directed    Pressure ulcer prevention protocol and care   Increase activity slowly   Complete by: As directed    No wound care   Complete by: As directed      Allergies as of 07/20/2020   No Known Allergies     Medication List    TAKE these medications   acetaminophen 160 MG/5ML solution Commonly known as: TYLENOL Place 20.3 mLs (650 mg total) into feeding tube every 4 (four) hours as  needed for mild pain or fever (temp > 101.5).   albuterol (2.5 MG/3ML) 0.083% nebulizer solution Commonly known as: PROVENTIL Take 3 mLs (2.5 mg total) by nebulization every 2 (two) hours as needed for wheezing.   amoxicillin-clavulanate 875-125 MG tablet Commonly known as: Augmentin Take 1 tablet by mouth 2 (two) times  daily for 5 days.   calamine lotion Apply topically as needed for itching.   chlorhexidine 0.12 % solution Commonly known as: PERIDEX 15 mLs by Mouth Rinse route 2 (two) times daily. What changed:   when to take this  reasons to take this   Chlorhexidine Gluconate Cloth 2 % Pads Apply 6 each topically daily.   enoxaparin 40 MG/0.4ML injection Commonly known as: LOVENOX Inject 0.4 mLs (40 mg total) into the skin daily.   feeding supplement (OSMOLITE 1.2 CAL) Liqd Place 1,000 mLs into feeding tube continuous. What changed:   how much to take  Another medication with the same name was removed. Continue taking this medication, and follow the directions you see here.   feeding supplement (PRO-STAT SUGAR FREE 64) Liqd Place 30 mLs into feeding tube daily.   free water Soln Place 100 mLs into feeding tube every 4 (four) hours. What changed: how much to take   furosemide 20 MG tablet Commonly known as: LASIX Place 1 tablet (20 mg total) into feeding tube daily.   guaiFENesin 100 MG/5ML Soln Commonly known as: ROBITUSSIN Place 5 mLs (100 mg total) into feeding tube every 4 (four) hours as needed for cough or to loosen phlegm.   lidocaine-prilocaine cream Commonly known as: EMLA Apply to affected area once What changed:   how much to take  how to take this  when to take this  reasons to take this   loperamide HCl 1 MG/7.5ML suspension Commonly known as: IMODIUM Place 15 mLs (2 mg total) into feeding tube as needed for diarrhea or loose stools (maximum 16mg  per day).   LORazepam 0.5 MG tablet Commonly known as: Ativan Take 1 tablet (0.5 mg  total) by mouth every 6 (six) hours as needed (Nausea or vomiting). What changed: Another medication with the same name was removed. Continue taking this medication, and follow the directions you see here.   magnesium oxide 400 (241.3 Mg) MG tablet Commonly known as: MAG-OX Take 1 tablet (400 mg total) by mouth 2 (two) times daily for 10 days.   multivitamin Liqd Place 15 mLs into feeding tube daily.   ondansetron 4 MG tablet Commonly known as: ZOFRAN Take 1 tablet (4 mg total) by mouth every 6 (six) hours as needed for nausea.   sertraline 50 MG tablet Commonly known as: ZOLOFT GIVE 1 TAB PER TUBE DAILY   traZODone 100 MG tablet Commonly known as: DESYREL Place 1 tablet (100 mg total) into feeding tube at bedtime.   triamcinolone cream 0.1 % Commonly known as: KENALOG Apply 1 application topically 2 (two) times daily as needed (itching).   Vitamin D3 10 MCG/ML Liqd Give 400 Units by tube daily. What changed: how to take this            Discharge Care Instructions  (From admission, onward)         Start     Ordered   07/18/20 0000  Discharge wound care:       Comments: Pressure ulcer prevention protocol and care   07/18/20 1018          Follow-up Information    [place], [name]. Schedule an appointment as soon as possible for a visit in 1 week(s).   Specialties: Internal Medicine, Family Medicine Contact information: [place] [address] [phone]        [facility] [place] Follow up.   Why: agency will provide home health therapy Contact information:  [place] [address] [phone]                Time coordinating discharge: 39 minutes  Signed:  Federica Allport  [facility] 07/20/2020, 8:08 AM

## 2020-07-20 NOTE — Telephone Encounter (Signed)
Patient also needs blood thinner , wife states its in the form of injection

## 2020-07-20 NOTE — TOC Progression Note (Addendum)
Transition of Care [facility]) - Progression Note    Patient Details  Name: Steve [name] MRN: [id number] Date of Birth: 04-26-1955  [facility] of [facility] CM/SW Contact  Leeroy Cha, RN Phone Number: 07/20/2020, 9:33 AM  Clinical Narrative:    Chronic respiratory failure on chronic O2 and trach. Continue trach care.  RT on board.  Continue frequent suctioning, albuterol inhaler.  Continue antibiotics.   Chronic PEG  Continue nutrition through the PEG tube and orally.  Tolerating PEG tube.  Normocytic anemia Likely anemia of chronic disease.  We will continue to follow closely.  Latest hemoglobin of 8.5.  Anxiety   Continue Zoloft, trazodone  Diabetes mellitus type 2. Diet controlled at baseline.  Latest hemoglobin A1c on 05/25/2020 was 5.8.  Continue diabetic diet, Accu-Cheks.  Hypomagnesemia.  Replenished.  Continue p.o. magnesium oxide.  Hypotension today.  We will give a 500 mL of normal saline bolus.  We will continue to monitor blood pressure closely.  GERD Continue PPI   Hodgkin's lymphoma Followed by Dr. Marin Olp as outpatient.  Seen by oncology during hospitalization.  Pressure injury of the buttocks stage II.  Present on admission. Continue wound care.  Pressure Injury 07/14/20 Buttocks Medial Stage 2 -  Partial thickness loss of dermis presenting as a shallow open injury with a red, pink wound bed without slough. red, open skin (Active)  07/14/20 1700  Location: Buttocks  Location Orientation: Medial  Staging: Stage 2 -  Partial thickness loss of dermis presenting as a shallow open injury with a red, pink wound bed without slough.  Wound Description (Comments): red, open skin  Present on Admission: Yes   PLAN: to return to previous level of care at home Patient discharge to return home ptar called for transport at 1046  Expected Discharge Plan: Home/Self Care Barriers to Discharge: Continued Medical Work up  Expected Discharge Plan  and Services Expected Discharge Plan: Home/Self Care   Discharge Planning Services: CM Consult   Living arrangements for the past 2 months: Mobile Home Expected Discharge Date: 07/20/20                         HH Arranged: PT HH Agency: [place] Date [place]: 07/18/20 Time [place]: 44 Representative spoke with at [place]: [place] ([place]) Interventions    Readmission Risk Interventions Readmission Risk Prevention Plan 07/22/2019  Transportation Screening Complete  Medication Review Press photographer) Complete  Some recent data might be hidden

## 2020-07-21 ENCOUNTER — Emergency Department (HOSPITAL_COMMUNITY): Payer: BC Managed Care – PPO

## 2020-07-21 ENCOUNTER — Inpatient Hospital Stay (HOSPITAL_COMMUNITY): Payer: BC Managed Care – PPO

## 2020-07-21 ENCOUNTER — Encounter (HOSPITAL_COMMUNITY): Payer: Self-pay | Admitting: Pulmonary Disease

## 2020-07-21 ENCOUNTER — Other Ambulatory Visit: Payer: Self-pay

## 2020-07-21 ENCOUNTER — Inpatient Hospital Stay (HOSPITAL_COMMUNITY)
Admission: EM | Admit: 2020-07-21 | Discharge: 2020-07-28 | Disposition: A | Discharge status: Home / Self Care | Payer: BC Managed Care – PPO | Source: Home / Self Care | Attending: Family Medicine | Admitting: Family Medicine

## 2020-07-21 DIAGNOSIS — J9622 Acute and chronic respiratory failure with hypercapnia: Secondary | ICD-10-CM | POA: Diagnosis present

## 2020-07-21 DIAGNOSIS — C8190 Hodgkin lymphoma, unspecified, unspecified site: Secondary | ICD-10-CM | POA: Diagnosis present

## 2020-07-21 DIAGNOSIS — Z79899 Other long term (current) drug therapy: Secondary | ICD-10-CM

## 2020-07-21 DIAGNOSIS — Z20822 Contact with and (suspected) exposure to covid-19: Secondary | ICD-10-CM | POA: Diagnosis present

## 2020-07-21 DIAGNOSIS — Z96643 Presence of artificial hip joint, bilateral: Secondary | ICD-10-CM | POA: Diagnosis present

## 2020-07-21 DIAGNOSIS — G4730 Sleep apnea, unspecified: Secondary | ICD-10-CM | POA: Diagnosis present

## 2020-07-21 DIAGNOSIS — E876 Hypokalemia: Secondary | ICD-10-CM | POA: Diagnosis present

## 2020-07-21 DIAGNOSIS — J8410 Pulmonary fibrosis, unspecified: Principal | ICD-10-CM | POA: Diagnosis present

## 2020-07-21 DIAGNOSIS — I10 Essential (primary) hypertension: Secondary | ICD-10-CM | POA: Diagnosis present

## 2020-07-21 DIAGNOSIS — E440 Moderate protein-calorie malnutrition: Secondary | ICD-10-CM | POA: Diagnosis present

## 2020-07-21 DIAGNOSIS — U099 Post covid-19 condition, unspecified: Secondary | ICD-10-CM | POA: Diagnosis present

## 2020-07-21 DIAGNOSIS — G4733 Obstructive sleep apnea (adult) (pediatric): Secondary | ICD-10-CM | POA: Diagnosis present

## 2020-07-21 DIAGNOSIS — Z6821 Body mass index (BMI) 21.0-21.9, adult: Secondary | ICD-10-CM

## 2020-07-21 DIAGNOSIS — G9341 Metabolic encephalopathy: Secondary | ICD-10-CM | POA: Diagnosis present

## 2020-07-21 DIAGNOSIS — E872 Acidosis: Secondary | ICD-10-CM | POA: Diagnosis present

## 2020-07-21 DIAGNOSIS — Z930 Tracheostomy status: Secondary | ICD-10-CM

## 2020-07-21 DIAGNOSIS — L89302 Pressure ulcer of unspecified buttock, stage 2: Secondary | ICD-10-CM | POA: Diagnosis present

## 2020-07-21 DIAGNOSIS — E1165 Type 2 diabetes mellitus with hyperglycemia: Secondary | ICD-10-CM | POA: Diagnosis present

## 2020-07-21 DIAGNOSIS — I959 Hypotension, unspecified: Secondary | ICD-10-CM | POA: Diagnosis present

## 2020-07-21 DIAGNOSIS — Z79891 Long term (current) use of opiate analgesic: Secondary | ICD-10-CM

## 2020-07-21 DIAGNOSIS — J9602 Acute respiratory failure with hypercapnia: Secondary | ICD-10-CM

## 2020-07-21 DIAGNOSIS — K219 Gastro-esophageal reflux disease without esophagitis: Secondary | ICD-10-CM | POA: Diagnosis present

## 2020-07-21 DIAGNOSIS — Z931 Gastrostomy status: Secondary | ICD-10-CM

## 2020-07-21 DIAGNOSIS — D630 Anemia in neoplastic disease: Secondary | ICD-10-CM | POA: Diagnosis present

## 2020-07-21 DIAGNOSIS — R0902 Hypoxemia: Secondary | ICD-10-CM

## 2020-07-21 DIAGNOSIS — J9621 Acute and chronic respiratory failure with hypoxia: Secondary | ICD-10-CM | POA: Diagnosis present

## 2020-07-21 LAB — C-REACTIVE PROTEIN: CRP: 1.8 mg/dL — ABNORMAL HIGH (ref <1.0)

## 2020-07-21 LAB — CBC WITH DIFFERENTIAL/PLATELET
Abs Immature Granulocytes: 0.2 10*3/uL — ABNORMAL HIGH (ref 0.00–0.07)
Basophils Absolute: 0 10*3/uL (ref 0.0–0.1)
Basophils Relative: 0 %
Eosinophils Absolute: 1.3 10*3/uL — ABNORMAL HIGH (ref 0.0–0.5)
Eosinophils Relative: 8 %
HCT: 28.3 % — ABNORMAL LOW (ref 39.0–52.0)
Hemoglobin: 8.3 g/dL — ABNORMAL LOW (ref 13.0–17.0)
Immature Granulocytes: 1 %
Lymphocytes Relative: 9 %
Lymphs Abs: 1.5 10*3/uL (ref 0.7–4.0)
MCH: 26.4 pg (ref 26.0–34.0)
MCHC: 29.3 g/dL — ABNORMAL LOW (ref 30.0–36.0)
MCV: 90.1 fL (ref 80.0–100.0)
Monocytes Absolute: 1.4 10*3/uL — ABNORMAL HIGH (ref 0.1–1.0)
Monocytes Relative: 9 %
Neutro Abs: 11.5 10*3/uL — ABNORMAL HIGH (ref 1.7–7.7)
Neutrophils Relative %: 73 %
Platelets: 277 10*3/uL (ref 150–400)
RBC: 3.14 MIL/uL — ABNORMAL LOW (ref 4.22–5.81)
RDW: 16.4 % — ABNORMAL HIGH (ref 11.5–15.5)
WBC: 15.9 10*3/uL — ABNORMAL HIGH (ref 4.0–10.5)
nRBC: 0 % (ref 0.0–0.2)

## 2020-07-21 LAB — BLOOD GAS, ARTERIAL
Acid-Base Excess: 13.2 mmol/L — ABNORMAL HIGH (ref 0.0–2.0)
Acid-Base Excess: 12.8 mmol/L — ABNORMAL HIGH (ref 0.0–2.0)
Bicarbonate: 40.9 mmol/L — ABNORMAL HIGH (ref 20.0–28.0)
Bicarbonate: 39.7 mmol/L — ABNORMAL HIGH (ref 20.0–28.0)
Drawn by: 270211
MECHVT: 520 mL
O2 Content: 98 L/min
O2 Saturation: 97.7 %
O2 Saturation: 96.5 %
PEEP: 5 cmH2O
Patient temperature: 98.6
Patient temperature: 98.6
RATE: 18 resp/min
pCO2 arterial: 82.7 mmHg (ref 32.0–48.0)
pCO2 arterial: 74.2 mmHg (ref 32.0–48.0)
pH, Arterial: 7.315 — ABNORMAL LOW (ref 7.350–7.450)
pH, Arterial: 7.348 — ABNORMAL LOW (ref 7.350–7.450)
pO2, Arterial: 97 mmHg (ref 83.0–108.0)
pO2, Arterial: 86.8 mmHg (ref 83.0–108.0)

## 2020-07-21 LAB — BASIC METABOLIC PANEL
Anion gap: 6 (ref 5–15)
Anion gap: 4 — ABNORMAL LOW (ref 5–15)
BUN: 25 mg/dL — ABNORMAL HIGH (ref 8–23)
BUN: 23 mg/dL (ref 8–23)
CO2: 39 mmol/L — ABNORMAL HIGH (ref 22–32)
CO2: 37 mmol/L — ABNORMAL HIGH (ref 22–32)
Calcium: 9.4 mg/dL (ref 8.9–10.3)
Calcium: 9.2 mg/dL (ref 8.9–10.3)
Chloride: 94 mmol/L — ABNORMAL LOW (ref 98–111)
Chloride: 92 mmol/L — ABNORMAL LOW (ref 98–111)
Creatinine, Ser: 0.69 mg/dL (ref 0.61–1.24)
Creatinine, Ser: 0.7 mg/dL (ref 0.61–1.24)
GFR, Estimated: >60 mL/min (ref >60)
GFR, Estimated: >60 mL/min (ref >60)
Glucose, Bld: 115 mg/dL — ABNORMAL HIGH (ref 70–99)
Glucose, Bld: 101 mg/dL — ABNORMAL HIGH (ref 70–99)
Potassium: 4.9 mmol/L (ref 3.5–5.1)
Potassium: 4.9 mmol/L (ref 3.5–5.1)
Sodium: 139 mmol/L (ref 135–145)
Sodium: 133 mmol/L — ABNORMAL LOW (ref 135–145)

## 2020-07-21 LAB — CBC
HCT: 27.2 % — ABNORMAL LOW (ref 39.0–52.0)
Hemoglobin: 7.9 g/dL — ABNORMAL LOW (ref 13.0–17.0)
MCH: 26.2 pg (ref 26.0–34.0)
MCHC: 29 g/dL — ABNORMAL LOW (ref 30.0–36.0)
MCV: 90.1 fL (ref 80.0–100.0)
Platelets: 240 10*3/uL (ref 150–400)
RBC: 3.02 MIL/uL — ABNORMAL LOW (ref 4.22–5.81)
RDW: 16.4 % — ABNORMAL HIGH (ref 11.5–15.5)
WBC: 14.3 10*3/uL — ABNORMAL HIGH (ref 4.0–10.5)
nRBC: 0 % (ref 0.0–0.2)

## 2020-07-21 LAB — BRAIN NATRIURETIC PEPTIDE: B Natriuretic Peptide: 141.5 pg/mL — ABNORMAL HIGH (ref 0.0–100.0)

## 2020-07-21 LAB — RESPIRATORY PANEL BY PCR

## 2020-07-21 LAB — PROCALCITONIN: Procalcitonin: 0.4 ng/mL

## 2020-07-21 LAB — LACTATE DEHYDROGENASE: LDH: 203 U/L — ABNORMAL HIGH (ref 98–192)

## 2020-07-21 LAB — PHOSPHORUS: Phosphorus: 3.1 mg/dL (ref 2.5–4.6)

## 2020-07-21 LAB — GLUCOSE, CAPILLARY
Glucose-Capillary: 106 mg/dL — ABNORMAL HIGH (ref 70–99)
Glucose-Capillary: 111 mg/dL — ABNORMAL HIGH (ref 70–99)
Glucose-Capillary: 152 mg/dL — ABNORMAL HIGH (ref 70–99)
Glucose-Capillary: 158 mg/dL — ABNORMAL HIGH (ref 70–99)

## 2020-07-21 LAB — SEDIMENTATION RATE: Sed Rate: 72 mm/hr — ABNORMAL HIGH (ref 0–16)

## 2020-07-21 LAB — MAGNESIUM: Magnesium: 1.8 mg/dL (ref 1.7–2.4)

## 2020-07-21 MED ORDER — LEVOFLOXACIN IN D5W 750 MG/150ML IV SOLN
750.0000 mg | INTRAVENOUS | Status: AC
  Administered 2020-07-21 – 2020-07-23 (×3): 750 mg via INTRAVENOUS
  Filled 2020-07-21 (×3): qty 150

## 2020-07-21 MED ORDER — ORAL CARE MOUTH RINSE
15.0000 mL | Freq: Two times a day (BID) | OROMUCOSAL | Status: DC
  Administered 2020-07-21 – 2020-07-27 (×13): 15 mL via OROMUCOSAL

## 2020-07-21 MED ORDER — SODIUM CHLORIDE 0.9% FLUSH
10.0000 mL | INTRAVENOUS | Status: DC | PRN
  Administered 2020-07-28: 10 mL

## 2020-07-21 MED ORDER — OSMOLITE 1.2 CAL PO LIQD
1000.0000 mL | ORAL | Status: DC
  Administered 2020-07-21: 1000 mL

## 2020-07-21 MED ORDER — PROSOURCE TF PO LIQD
45.0000 mL | Freq: Three times a day (TID) | ORAL | Status: DC
  Administered 2020-07-21 – 2020-07-28 (×21): 45 mL
  Filled 2020-07-21 (×21): qty 45

## 2020-07-21 MED ORDER — FREE WATER
100.0000 mL | Status: DC
  Administered 2020-07-21 – 2020-07-28 (×42): 100 mL

## 2020-07-21 MED ORDER — HEPARIN SODIUM (PORCINE) 5000 UNIT/ML IJ SOLN
5000.0000 [IU] | Freq: Three times a day (TID) | INTRAMUSCULAR | Status: DC
  Administered 2020-07-21 – 2020-07-28 (×22): 5000 [IU] via SUBCUTANEOUS
  Filled 2020-07-21 (×21): qty 1

## 2020-07-21 MED ORDER — POLYETHYLENE GLYCOL 3350 17 G PO PACK: 17.0000 g | PACK | Freq: Every day | ORAL | Status: DC | PRN

## 2020-07-21 MED ORDER — PANTOPRAZOLE SODIUM 40 MG IV SOLR
40.0000 mg | Freq: Every day | INTRAVENOUS | Status: DC
  Administered 2020-07-21 – 2020-07-22 (×2): 40 mg via INTRAVENOUS
  Filled 2020-07-21 (×2): qty 40

## 2020-07-21 MED ORDER — SODIUM CHLORIDE 0.9% FLUSH
10.0000 mL | Freq: Two times a day (BID) | INTRAVENOUS | Status: DC
  Administered 2020-07-21 (×2): 10 mL
  Administered 2020-07-22 (×2): 20 mL
  Administered 2020-07-23 – 2020-07-27 (×9): 10 mL

## 2020-07-21 MED ORDER — CHLORHEXIDINE GLUCONATE CLOTH 2 % EX PADS
6.0000 | MEDICATED_PAD | Freq: Every day | CUTANEOUS | Status: DC
  Administered 2020-07-21 – 2020-07-27 (×7): 6 via TOPICAL

## 2020-07-21 MED ORDER — VITAL HIGH PROTEIN PO LIQD
1000.0000 mL | ORAL | Status: DC
Start: 2020-07-21 — End: 2020-07-21

## 2020-07-21 MED ORDER — LACTATED RINGERS IV BOLUS
1000.0000 mL | Freq: Once | INTRAVENOUS | Status: AC
  Administered 2020-07-21: 1000 mL via INTRAVENOUS

## 2020-07-21 MED ORDER — DOCUSATE SODIUM 100 MG PO CAPS: 100.0000 mg | ORAL_CAPSULE | Freq: Two times a day (BID) | ORAL | Status: DC | PRN

## 2020-07-21 MED ORDER — LACTATED RINGERS IV BOLUS
500.0000 mL | Freq: Once | INTRAVENOUS | Status: AC
  Administered 2020-07-21: 500 mL via INTRAVENOUS

## 2020-07-21 MED ORDER — LINEZOLID 600 MG/300ML IV SOLN
600.0000 mg | Freq: Two times a day (BID) | INTRAVENOUS | Status: DC
  Administered 2020-07-21 – 2020-07-23 (×5): 600 mg via INTRAVENOUS
  Filled 2020-07-21 (×5): qty 300

## 2020-07-21 MED ORDER — ONDANSETRON HCL 4 MG/2ML IJ SOLN
4.0000 mg | Freq: Once | INTRAMUSCULAR | Status: DC
  Filled 2020-07-21: qty 2

## 2020-07-21 MED ORDER — SODIUM CHLORIDE 0.9 % IV SOLN
INTRAVENOUS | Status: DC | PRN
  Administered 2020-07-21: 250 mL via INTRAVENOUS
  Administered 2020-07-22: 500 mL via INTRAVENOUS

## 2020-07-21 NOTE — Progress Notes (Signed)
PT denies need for trach suctioning at this time. 

## 2020-07-21 NOTE — Progress Notes (Signed)
I with Dr Florina Ou at bedside changed trach from a 6 shiley uncuffed to a 6 shiley flexible cuffed trach. This was done without complication. Placement confirmed with end tidal Co2. Trach change was needed in order to place him on the ventilator. Pt was then placed on vt 520 rate 18 45% and peep of 5. O2 saturations are 97% and pt is resting in bed.

## 2020-07-21 NOTE — Telephone Encounter (Signed)
Made wife aware of refill at [facility] , patient is in hospital currently , will call us on 4/13 to keep or cancel appointment on 4/14

## 2020-07-21 NOTE — H&P (Signed)
NAME:  Steve [name], MRN:  [id number], DOB:  11/02/1955, LOS: 0 ADMISSION DATE:  07/21/2020, CONSULTATION DATE:  07/21/20 REFERRING MD:  [place] CHIEF COMPLAINT:  Dyspnea   History of Present Illness:  Steve [name] is a 65 y.o. 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  He presented to [facility] ED 07/20/20 with dyspnea and inability to maintain O2 sats above 70% despite suctioning.  EMS placed him on 15L O2 via trach collar and sats improved to 90%.  In ED, he was in distress on ATC and trach was ultimately changed to 6 cuffed shiley in order to place him on the ventilator.  PCCM subsequently called for admission.  He was admitted to [facility] from 4/5 through 4/11 for HCAP.  He was treated with a course of vanc and cefepime and discharged on Augmentin.  Unable to obtain history from patient as he is obtunded.   He is followed by Oncology for Hodgkin Lymphoma and received Brentuximab vedotin on 07/10/20.   Pertinent  Medical History:  has HYPOGONADISM, MALE; SLEEP APNEA; Abdominal pain, RLQ; Staphylococcus aureus bacteremia; Abscess of muscle; H/O dental abscess; Routine general medical examination at a health care facility; Elbow pain; Septic arthritis of hip ([place]); Prosthetic joint infection ([place]); Abnormal CT scan, pancreas - tail area; Hodgkin's lymphoma ([place]); Anemia due to antineoplastic chemotherapy; Chemotherapy-induced neuropathy ([place]); Chemotherapy-induced diarrhea; Neutropenic fever ([place]); Febrile neutropenia ([place]); COVID-19; Anemia; Hyponatremia; Protein-calorie malnutrition, severe ([place]); Malnutrition of moderate degree; HCAP (healthcare-associated pneumonia); Hypoxia; 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[place]); Acute respiratory failure with hypoxemia ([place]); Pressure injury of skin; Acute respiratory distress syndrome (ARDS) due to COVID-19 virus ([place]); Pneumomediastinum ([place]); Tracheostomy dependence ([place]); Subcutaneous air ([place]); Dysphagia;  Pneumothorax; On mechanically assisted ventilation ([place]); Chronic respiratory failure with hypoxia ([name]); ARDS (adult respiratory distress syndrome) ([place]); Hodgkin's lymphoma ([place]); Pneumothorax, acute; Healthcare-associated pneumonia; COVID-19 virus infection; Pneumothorax on right; Tracheostomy care [facility]); Debility; Anxiety; Acute on chronic respiratory failure with hypoxia ([place]); Uncontrolled type 2 diabetes mellitus with hyperglycemia, with long-term current use of insulin ([place]); Essential hypertension; GERD without esophagitis; Pulmonary fibrosis ([place]); Bronchiectasis ([place]); Acute respiratory failure with hypoxia ([place]); Pneumonia; DM (diabetes mellitus), type 2 ([place]); and Acute on chronic respiratory failure with hypoxia and hypercapnia ([place]) on their problem list.  Significant Hospital Events: Including procedures, antibiotic start and stop dates in addition to other pertinent events   4/12 > admit.  Micro: Sputum 4/12 >   Interim History / Subjective:    Objective:  Blood pressure 96/66, pulse 92, resp. rate 20, height 5' 7"  (1.702 m), SpO2 99 %.    Vent Mode: PRVC FiO2 (%):  [45 %-98 %] 45 % Set Rate:  [18 bmp] 18 bmp Vt Set:  [520 mL] 520 mL PEEP:  [5 cmH20] 5 cmH20 Plateau Pressure:  [28 cmH20] 28 cmH20  No intake or output data in the 24 hours ending 07/21/20 0423 There were no vitals filed for this visit.  Examination: General: chronically ill appearing, obtunded, no acute distress Neuro: Not alert or awake to verbal stimuli. HEENT: PERRL, dry mucous membranes, sclera anicteric Cardiovascular: rrr, no murmurs or gallups Lungs: course breath sounds, crackles present, no wheezing Abdomen: soft, non-distended, Bowel sounds present Musculoskeletal: no edema Skin: no rashes   Labs/imaging personally reviewed:  CBC with WBC 15.9, neutrophil predominant.   BMP with serum bicarb of 39.   CXR with persistent bilateral air space opacities, with slight improvement compared to  last  film.    Previous respiratory micro: pseudomonas (pan-sensitive), stenotrophomonas (resistent to bactrim), klebsiella   Resolved Hospital Problem list:    Assessment & Plan:  Acute on Chronic Hypercapnic and Hypoxemic Respiratory failure Pulmonary Fibrosis due to Covid 19 and Occupational Lung Disease Bronchiectasis - Acute respiratory failure is possibly due to infectious etiology due to resistant organism as he was recently treated with cefepime/vancomycin vs. pneumonitits reaction to the brentuximab vedotin given on 4/1.  - Continue mechanical ventilation, lung protective strategy - Give levaquin IV for history of stenotrophomonas and linezolid for any possible VRE organisms. - Check CRP, LDH, BNP, ESR, fungitell and procalcitonin - Obtain tracheal aspirate for culture and send extended respiratory PCR viral panel  - Will consider starting steroids 72m/kg for concern of drug induced pneumonitis  Acute Encephalopathy In setting of respiratory failure or metabolic encephalopathy - no sedation at this time - Consider CT head if no improvement with hypercapnia  Hodgkin Lymphoma - Oncology is following - No acute treatment changes at this time  Best practice (evaluated daily):  Diet:  NPO Pain/Anxiety/Delirium protocol (if indicated): No VAP protocol (if indicated): Yes DVT prophylaxis: Subcutaneous Heparin GI prophylaxis: PPI Glucose control:  SSI No Central venous access:  N/A Arterial line:  N/A Foley:  N/A Mobility:  bed rest  PT consulted: N/A Last date of multidisciplinary goals of care discussion Will need to discuss with patient and his wife. Palliative care consult may be in order. Code Status:  full code Disposition: ICU  Labs   CBC: Recent Labs  Lab 07/14/20 1233 07/15/20 0244 07/17/20 0330 07/18/20 0424 07/19/20 0545 07/20/20 0559 07/21/20 0300  WBC 16.3*   < > 12.3* 13.5* 13.8* 13.0* 15.9*  NEUTROABS 13.9*  --   --   --   --   --  11.5*  HGB  9.3*   < > 8.0* 8.2* 8.5* 7.7* 8.3*  HCT 31.1*   < > 27.2* 28.3* 29.5* 26.5* 28.3*  MCV 87.6   < > 90.1 89.6 91.9 90.1 90.1  PLT 234   < > 225 229 232 215 277   < > = values in this interval not displayed.    Basic Metabolic Panel: Recent Labs  Lab 07/16/20 0249 07/17/20 0330 07/18/20 0424 07/19/20 0545 07/20/20 0559 07/21/20 0300  NA 139 140 137 136 137 139  K 4.1 4.0 4.5 4.8 4.7 4.9  CL 98 99 95* 92* 97* 94*  CO2 34* 36* 38* 37* 36* 39*  GLUCOSE 158* 177* 183* 188* 164* 115*  BUN 29* 26* 26* 24* 21 25*  CREATININE 0.75 0.63 0.76 0.77 0.66 0.69  CALCIUM 9.1 8.7* 8.8* 8.8* 8.6* 9.4  MG 1.7 1.6*  --  2.2 1.9  --   PHOS 2.8 2.7  --   --  2.4*  --    GFR: Estimated Creatinine Clearance: 79.8 mL/min (by C-G formula based on SCr of 0.69 mg/dL). Recent Labs  Lab 07/14/20 1235 07/15/20 0244 07/18/20 0424 07/19/20 0545 07/20/20 0559 07/21/20 0300  WBC  --    < > 13.5* 13.8* 13.0* 15.9*  LATICACIDVEN 0.8  --   --   --   --   --    < > = values in this interval not displayed.    Liver Function Tests: Recent Labs  Lab 07/14/20 1233 07/15/20 0244 07/16/20 0249  AST 21 19 19   ALT 21 18 18   ALKPHOS 54 51 46  BILITOT 1.0 0.6 0.5  PROT 7.8 7.1 6.6  ALBUMIN 2.8* 2.5* 2.3*   No results for input(s): LIPASE, AMYLASE in the last 168 hours. No results for input(s): AMMONIA in the last 168 hours.  ABG    Component Value Date/Time   PHART 7.315 (L) 07/21/2020 0230   PCO2ART 82.7 (HH) 07/21/2020 0230   PO2ART 97.0 07/21/2020 0230   HCO3 40.9 (H) 07/21/2020 0230   TCO2 28 07/08/2019 1331   O2SAT 97.7 07/21/2020 0230     Coagulation Profile: No results for input(s): INR, PROTIME in the last 168 hours.  Cardiac Enzymes: No results for input(s): CKTOTAL, CKMB, CKMBINDEX, TROPONINI in the last 168 hours.  HbA1C: Hgb A1c MFr Bld  Date/Time Value Ref Range Status  05/25/2020 05:42 PM 5.8 (H) 4.8 - 5.6 % Final    Comment:    (NOTE)         Prediabetes: 5.7 - 6.4          Diabetes: >6.4         Glycemic control for adults with diabetes: <7.0   03/23/2020 12:39 PM 5.7 (H) 4.8 - 5.6 % Final    Comment:    (NOTE) Pre diabetes:          5.7%-6.4%  Diabetes:              >6.4%  Glycemic control for   <7.0% adults with diabetes     CBG: No results for input(s): GLUCAP in the last 168 hours.  Review of Systems:   Unable to obtain due to patient's mental status.  Past Medical History:  He,  has a past medical history of Abscess of muscle (08/10/2011), Acute on chronic respiratory failure with hypoxia ([place]), Acute respiratory distress syndrome (ARDS) due to COVID-19 virus ([place]), Anxiety, COVID-19 virus infection, Diabetes mellitus without complication ([place]), GERD (gastroesophageal reflux disease), HCAP (healthcare-associated pneumonia), Healthcare-associated pneumonia, Hip dysplasia, congenital, Hodgkin lymphoma ([place]), Hodgkin's lymphoma ([place]), Hypertension, Pancreatitis, Pneumothorax, acute, Postoperative anemia due to acute blood loss (09/07/2012), Septic arthritis of hip ([place]) (09/05/2012), and Sleep apnea.   Surgical History:   Past Surgical History:  Procedure Laterality Date  . COLONOSCOPY  04/26/2007  . HERNIA REPAIR     inguinal hernia x3  . IR GASTROSTOMY TUBE MOD SED  08/22/2019  . IR IMAGING GUIDED PORT INSERTION  03/29/2019  . JOINT REPLACEMENT  2002 & 2007   bilateral hip replacement  . LYMPH NODE BIOPSY Right 03/20/2019   Procedure: DEEP RIGHT SUPRACLAVICULAR LYMPH NODE EXCISION;  Surgeon: Fanny Skates, MD;  Location: [place];  Service: General;  Laterality: Right;  . MULTIPLE EXTRACTIONS WITH ALVEOLOPLASTY  07/28/2011   Procedure: MULTIPLE EXTRACION WITH ALVEOLOPLASTY;  Surgeon: Lenn Cal, DDS;  Location: [facility];  Service: Oral Surgery;  Laterality: N/A;  Extraction of tooth #'s 2,3,4,5,6,11,12,13,15,19,22 with alveoloplasty.  . shoulder repair - right for separation of shoulder    . TEE WITHOUT CARDIOVERSION   07/29/2011   Procedure: TRANSESOPHAGEAL ECHOCARDIOGRAM (TEE);  Surgeon: Josue Hector, MD;  Location: [place];  Service: Cardiovascular;  Laterality: N/A;  . TOTAL HIP REVISION Right 09/05/2012   Procedure: RIGHT HIP RESECTION ARTHROPLASTY WITH ANTIBIOTIC SPACERS;  Surgeon: [name], MD;  Location: [facility];  Service: Orthopedics;  Laterality: Right;  . TOTAL HIP REVISION Right 11/30/2012   Procedure: RIGHT TOTAL HIP ARTHROPLASTY REIMPLANTATION;  Surgeon: [name], MD;  Location: [facility];  Service: Orthopedics;  Laterality: Right;     Social History:   reports that he has never smoked. He  has never used smokeless tobacco. He reports current alcohol use. He reports that he does not use drugs.   Family History:  His family history includes Heart attack in his mother; Melanoma in his mother. There is no history of Hyperlipidemia, Sudden death, Hypertension, or Diabetes.   Allergies No Known Allergies   Home Medications  Prior to Admission medications   Medication Sig Start Date End Date Taking? Authorizing Provider  albuterol (PROVENTIL) (2.5 MG/3ML) 0.083% nebulizer solution Take 3 mLs (2.5 mg total) by nebulization every 2 (two) hours as needed for wheezing. 07/13/20  Yes Saguier, Percell Miller, PA-C  Amino Acids-Protein Hydrolys (FEEDING SUPPLEMENT, PRO-STAT SUGAR FREE 64,) LIQD Place 30 mLs into feeding tube daily.   Yes [provider]  calamine lotion Apply topically as needed for itching. 07/18/20  Yes Pokhrel, Laxman, MD  chlorhexidine (PERIDEX) 0.12 % solution 15 mLs by Mouth Rinse route 2 (two) times daily. Patient taking differently: 15 mLs by Mouth Rinse route 2 (two) times daily as needed (mouth pain). 04/17/20  Yes Florencia Reasons, MD  Cholecalciferol (VITAMIN D3) 10 MCG/ML LIQD Give 400 Units by tube daily. Patient taking differently: Place 400 Units into feeding tube daily. 04/17/20  Yes Florencia Reasons, MD  enoxaparin (LOVENOX) 40 MG/0.4ML injection Inject 0.4 mLs (40 mg total)  into the skin daily. 07/11/20  Yes Saguier, Percell Miller, PA-C  furosemide (LASIX) 20 MG tablet Place 1 tablet (20 mg total) into feeding tube daily. 07/11/20  Yes Saguier, Percell Miller, PA-C  guaiFENesin (ROBITUSSIN) 100 MG/5ML SOLN Place 5 mLs (100 mg total) into feeding tube every 4 (four) hours as needed for cough or to loosen phlegm. 07/18/20  Yes Pokhrel, Laxman, MD  lidocaine-prilocaine (EMLA) cream Apply to affected area once Patient taking differently: Apply 1 application topically daily as needed (access port). Apply to affected area once 07/09/20  Yes Ennever, Rudell Cobb, MD  loperamide HCl (IMODIUM) 1 MG/7.5ML suspension Place 15 mLs (2 mg total) into feeding tube as needed for diarrhea or loose stools (maximum 34m per day). 07/20/20  Yes Pokhrel, Laxman, MD  LORazepam (ATIVAN) 0.5 MG tablet Take 1 tablet (0.5 mg total) by mouth every 6 (six) hours as needed (Nausea or vomiting). 07/09/20  Yes EVolanda Napoleon MD  Multiple Vitamin (MULTIVITAMIN) LIQD Place 15 mLs into feeding tube daily. 08/28/19  Yes Ollis, BCleaster Corin NP  Nutritional Supplements (FEEDING SUPPLEMENT, OSMOLITE 1.2 CAL,) LIQD Place 1,000 mLs into feeding tube continuous. Patient taking differently: Place 70 mLs into feeding tube continuous. 04/17/20  Yes XFlorencia Reasons MD  sertraline (ZOLOFT) 50 MG tablet GIVE 1 TAB PER TUBE DAILY 07/07/20  Yes Saguier, EPercell Miller PA-C  traZODone (DESYREL) 100 MG tablet Place 1 tablet (100 mg total) into feeding tube at bedtime. 07/11/20  Yes Saguier, EPercell Miller PA-C  triamcinolone (KENALOG) 0.1 % Apply 1 application topically 2 (two) times daily as needed (itching).   Yes [provider]  Water For Irrigation, Sterile (FREE WATER) SOLN Place 100 mLs into feeding tube every 4 (four) hours. Patient taking differently: Place 120 mLs into feeding tube every 4 (four) hours. 04/17/20  Yes XFlorencia Reasons MD  acetaminophen (TYLENOL) 160 MG/5ML solution Place 20.3 mLs (650 mg total) into feeding tube every 4 (four) hours as needed for  mild pain or fever (temp > 101.5). Patient not taking: Reported on 07/21/2020 08/27/19   ODonita Brooks NP  amoxicillin-clavulanate (AUGMENTIN) 875-125 MG tablet Take 1 tablet by mouth 2 (two) times daily for 5 days. 07/18/20 07/23/20  Pokhrel,  Laxman, MD  Chlorhexidine Gluconate Cloth 2 % PADS Apply 6 each topically daily. Patient not taking: No sig reported 04/18/20   Florencia Reasons, MD  magnesium oxide (MAG-OX) 400 (241.3 Mg) MG tablet Take 1 tablet (400 mg total) by mouth 2 (two) times daily for 10 days. 07/18/20 07/28/20  Pokhrel, Corrie Mckusick, MD  ondansetron (ZOFRAN) 4 MG tablet Take 1 tablet (4 mg total) by mouth every 6 (six) hours as needed for nausea. 07/18/20   Flora Lipps, MD     Critical care time: 72 minutes   Freda Jackson, MD [facility] Office: [phone]   See Amion for personal pager PCCM on call pager [phone] until 7pm. Please call Elink 7p-7a. [phone]

## 2020-07-21 NOTE — ED Notes (Signed)
Patient appears to desat while sleeping. O2 increased to 12L

## 2020-07-21 NOTE — ED Notes (Signed)
Patient transitioned tent. RT and Dr at bedside.

## 2020-07-21 NOTE — TOC Initial Note (Addendum)
Transition of Care [facility]) - Initial/Assessment Note    Patient Details  Name: Steve [name] MRN: [id number] Date of Birth: 01-May-1955  Transition of Care [facility] CM/SW Contact:    Leeroy Cha, RN Phone Number: 07/21/2020, 8:12 AM  Clinical Narrative:                 Steve [name] is a 65 y.o. 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  He presented to [facility] ED 07/20/20 with dyspnea and inability to maintain O2 sats above 70% despite suctioning.  EMS placed him on 15L O2 via trach collar and sats improved to 90%.  In ED, he was in distress on ATC and trach was ultimately changed to 6 cuffed shiley in order to place him on the ventilator.  PCCM subsequently called for admission.  He was admitted to [facility] from 4/5 through 4/11 for HCAP.  He was treated with a course of vanc and cefepime and discharged on Augmentin.  Unable to obtain history from patient as he is obtunded.   He is followed by Oncology for Hodgkin Lymphoma and received Brentuximab vedotin on 07/10/20.   Pertinent  Medical History:  has HYPOGONADISM, MALE; SLEEP APNEA; Abdominal pain, RLQ; Staphylococcus aureus bacteremia; Abscess of muscle; H/O dental abscess; Routine general medical examination at a health care facility; Elbow pain; Septic arthritis of hip ([place]); Prosthetic joint infection ([place]); Abnormal CT scan, pancreas - tail area; Hodgkin's lymphoma ([place]); Anemia due to antineoplastic chemotherapy; Chemotherapy-induced neuropathy ([place]); Chemotherapy-induced diarrhea; Neutropenic fever ([place]); Febrile neutropenia ([place]); COVID-19; Anemia; Hyponatremia; Protein-calorie malnutrition, severe ([place]); Malnutrition of moderate degree; HCAP (healthcare-associated pneumonia); Hypoxia; 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[place]); Acute respiratory failure with hypoxemia ([place]); Pressure injury of skin; Acute respiratory distress syndrome (ARDS) due to COVID-19 virus ([place]);  Pneumomediastinum ([place]); Tracheostomy dependence ([place]); Subcutaneous air ([place]); Dysphagia; Pneumothorax; On mechanically assisted ventilation ([place]); Chronic respiratory failure with hypoxia ([name]); ARDS (adult respiratory distress syndrome) ([place]); Hodgkin's lymphoma ([place]); Pneumothorax, acute; Healthcare-associated pneumonia; COVID-19 virus infection; Pneumothorax on right; Tracheostomy care [facility]); Debility; Anxiety; Acute on chronic respiratory failure with hypoxia ([place]); Uncontrolled type 2 diabetes mellitus with hyperglycemia, with long-term current use of insulin ([place]); Essential hypertension; GERD without esophagitis; Pulmonary fibrosis ([place]); Bronchiectasis ([place]); Acute respiratory failure with hypoxia ([place]); Pneumonia; DM (diabetes mellitus), type 2 ([place]); and Acute on chronic respiratory failure with hypoxia and hypercapnia ([place]) on their problem list.  Significant Hospital Events: Including procedures, antibiotic start and stop dates in addition to other pertinent events   4/12 > admit. PLAN: unclear at this time pt was dcd to home on [id number] and return within 24 hours, now on vent. And obtunded.        Patient Goals and CMS Choice        Expected Discharge Plan and Services                                                Prior Living Arrangements/Services                       Activities of Daily Living      Permission Sought/Granted                  Emotional Assessment  Admission diagnosis:  Acute hypercapnic respiratory failure ([name]) [J96.02] Acute on chronic respiratory failure with hypoxia and hypercapnia ([name]) [Z36.64, J96.22] Patient Active Problem List   Diagnosis Date Noted  . Acute on chronic respiratory failure with hypoxia and hypercapnia ([place]) 07/21/2020  . DM (diabetes mellitus), type 2 ([place]) 07/15/2020  . Pneumonia 07/14/2020  . Pulmonary fibrosis ([place]) 05/26/2020  . Bronchiectasis ([place]) 05/26/2020  . Acute  respiratory failure with hypoxia ([place]) 05/26/2020  . Acute on chronic respiratory failure with hypoxia ([place]) 05/25/2020  . Uncontrolled type 2 diabetes mellitus with hyperglycemia, with long-term current use of insulin ([place]) 05/25/2020  . Essential hypertension 05/25/2020  . GERD without esophagitis 05/25/2020  . Debility   . Anxiety   . Tracheostomy care ([place])   . Pneumothorax on right 03/23/2020  . Chronic respiratory failure with hypoxia ([place])   . ARDS (adult respiratory distress syndrome) ([place])   . Hodgkin's lymphoma ([place])   . Pneumothorax, acute   . Healthcare-associated pneumonia   . COVID-19 virus infection   . On mechanically assisted ventilation ([place])   . Dysphagia   . Pneumothorax   . Subcutaneous air ([place])   . Tracheostomy dependence ([place])   . Pneumomediastinum ([place])   . Acute respiratory distress syndrome (ARDS) due to COVID-19 virus ([place])   . Pressure injury of skin 07/27/2019  . Acute respiratory failure with hypoxemia ([place])   . 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 Malnutrition of moderate degree 07/10/2019  . HCAP (healthcare-associated pneumonia)   . Hypoxia   . Febrile neutropenia ([place]) 05/17/2019  . COVID-19 05/17/2019  . Anemia 05/17/2019  . Hyponatremia 05/17/2019  . Protein-calorie malnutrition, severe ([place]) 05/17/2019  . Neutropenic fever ([place]) 05/16/2019  . Chemotherapy-induced neuropathy ([place]) 04/22/2019  . Chemotherapy-induced diarrhea 04/22/2019  . Anemia due to antineoplastic chemotherapy 03/25/2019  . Hodgkin's lymphoma ([place]) 02/25/2019  . Abnormal CT scan, pancreas - tail area 01/31/2013  . Prosthetic joint infection ([place]) 09/20/2012  . Septic arthritis of hip ([place]) 09/05/2012  . Routine general medical examination at a health care facility 09/12/2011  . Elbow pain 09/12/2011  . Abscess of muscle 08/10/2011  . H/O dental abscess 08/10/2011  . Staphylococcus aureus bacteremia 07/27/2011  . Abdominal pain, RLQ  09/02/2010  . HYPOGONADISM, MALE 03/23/2007  . SLEEP APNEA 03/23/2007   PCP:  [name], PA-C Pharmacy:   [id number] [place], [address] [place] [address] Phone: [phone] Fax: [phone]     Social Determinants of Health (SDOH) Interventions    Readmission Risk Interventions Readmission Risk Prevention Plan 07/22/2019  Transportation Screening Complete  Medication Review (RN Care Manager) Complete  Some recent data might be hidden

## 2020-07-21 NOTE — ED Triage Notes (Signed)
Patient arrived from home via EMS. EMS reports wife stated patient was hypoxic even after increasing his home O2. Patient was 70% with EMS. EMS gave 2 duonebs and O2 increased to 95% on 10L via trach. 20G IV in RFA started by EMS. Patient was discharged from hospital on 07/20/2020 after having PNA.

## 2020-07-21 NOTE — ED Notes (Signed)
Patient is incontinent of urine and bowels. Diarrhea is brown and watery. Trach care was performed on patient to include, cleaning and suctioning. Patient vomited while coughing. Medication was not given as patient felt relived and was no longernauseous. Patient was placed on external urine collection device.

## 2020-07-21 NOTE — ED Provider Notes (Signed)
MSE was initiated and I personally evaluated the patient and placed orders (if any) at  12:56 AM on July 21, 2020.  The patient appears stable so that the remainder of the MSE may be completed by another provider.  Respiratory therapist at bedside for trach care.    Aaro Meyers, Jenny Reichmann, MD 07/21/20 [phone]

## 2020-07-21 NOTE — Progress Notes (Signed)
NAME:  Steve [name], MRN:  [id number], DOB:  [birth year], LOS: 0 ADMISSION DATE:  07/21/2020, CONSULTATION DATE:  07/21/20 REFERRING MD:  [place] CHIEF COMPLAINT:  Dyspnea   History of Present Illness:  Steve [name] is a 65 y.o. 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  He presented to [facility] ED 07/20/20 with dyspnea and inability to maintain O2 sats above 70% despite suctioning.  EMS placed him on 15L O2 via trach collar and sats improved to 90%.  In ED, he was in distress on ATC and trach was ultimately changed to 6 cuffed shiley in order to place him on the ventilator.  PCCM subsequently called for admission.  He was admitted to [facility] from 4/5 through 4/11 for HCAP.  He was treated with a course of vanc and cefepime and discharged on Augmentin.  Unable to obtain history from patient as he is obtunded.   He is followed by Oncology for Hodgkin Lymphoma and received Brentuximab vedotin on 07/10/20.   Pertinent  Medical History:  has HYPOGONADISM, MALE; SLEEP APNEA; Abdominal pain, RLQ; Staphylococcus aureus bacteremia; Abscess of muscle; H/O dental abscess; Routine general medical examination at a health care facility; Elbow pain; Septic arthritis of hip ([place]); Prosthetic joint infection ([place]); Abnormal CT scan, pancreas - tail area; Hodgkin's lymphoma ([place]); Anemia due to antineoplastic chemotherapy; Chemotherapy-induced neuropathy ([place]); Chemotherapy-induced diarrhea; Neutropenic fever ([place]); Febrile neutropenia ([place]); COVID-19; Anemia; Hyponatremia; Protein-calorie malnutrition, severe ([place]); Malnutrition of moderate degree; HCAP (healthcare-associated pneumonia); Hypoxia; 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[place]); Acute respiratory failure with hypoxemia ([place]); Pressure injury of skin; Acute respiratory distress syndrome (ARDS) due to COVID-19 virus ([place]); Pneumomediastinum ([place]); Tracheostomy dependence ([place]); Subcutaneous air ([place]); Dysphagia;  Pneumothorax; On mechanically assisted ventilation ([place]); Chronic respiratory failure with hypoxia ([name]); ARDS (adult respiratory distress syndrome) ([place]); Hodgkin's lymphoma ([place]); Pneumothorax, acute; Healthcare-associated pneumonia; COVID-19 virus infection; Pneumothorax on right; Tracheostomy care [facility]); Debility; Anxiety; Acute on chronic respiratory failure with hypoxia ([place]); Uncontrolled type 2 diabetes mellitus with hyperglycemia, with long-term current use of insulin ([place]); Essential hypertension; GERD without esophagitis; Pulmonary fibrosis ([place]); Bronchiectasis ([place]); Acute respiratory failure with hypoxia ([place]); Pneumonia; DM (diabetes mellitus), type 2 ([place]); and Acute on chronic respiratory failure with hypoxia and hypercapnia ([place]) on their problem list.  Significant Hospital Events: Including procedures, antibiotic start and stop dates in addition to other pertinent events   4/12 > admit.  Micro: Sputum 4/12 >   Interim History / Subjective:  Hypotensive, light headed. Improved with IVFs  Objective:  Blood pressure (!) 107/59, pulse 93, temperature 98 F (36.7 C), temperature source Oral, resp. rate (!) 29, height 5' 6.5" (1.689 m), weight 64.2 kg, SpO2 91 %.    Vent Mode: PRVC FiO2 (%):  [40 %-98 %] 40 % Set Rate:  [18 bmp] 18 bmp Vt Set:  [520 mL] 520 mL PEEP:  [5 cmH20] 5 cmH20 Plateau Pressure:  [28 cmH20] 28 cmH20   Intake/Output Summary (Last 24 hours) at 07/21/2020 1846 Last data filed at 07/21/2020 1831 Gross per 24 hour  Intake 1510.19 ml  Output 1700 ml  Net -189.81 ml   Filed Weights   07/21/20 0544  Weight: 64.2 kg    Examination: General: chronically ill appearing, obtunded, no acute distress Neuro: Not alert or awake to verbal stimuli. HEENT: PERRL, dry mucous membranes, sclera anicteric Cardiovascular: rrr, no murmurs or gallups Lungs: course breath sounds, crackles present, no wheezing Abdomen: soft, non-distended, Bowel sounds  present Musculoskeletal: no edema Skin: no rashes   Labs/imaging personally reviewed:  And as per EMR  Resolved Hospital Problem list:    Assessment & Plan:  Acute on Chronic Hypercapnic and Hypoxemic Respiratory failure Pulmonary Fibrosis due to Covid 19 and Occupational Lung Disease Bronchiectasis - Acute respiratory failure is possibly due to infectious etiology due to resistant organism as he was recently treated with cefepime/vancomycin vs. pneumonitits reaction to the brentuximab vedotin given on 4/1.  - TC doing ok - CT chest ordered eval new infiltrate vs pneumonitis? CXR look improved compared to prior CXR recent admission  Hypotension: With orthostatic symptoms, hypovolemia --s/p 1.5L LR with imrpovement  Acute Encephalopathy In setting of respiratory failure or metabolic encephalopathy, improved - no sedation at this time  Hodgkin Lymphoma - Oncology is following - No acute treatment changes at this time  Best practice (evaluated daily):  Diet:  TF Pain/Anxiety/Delirium protocol (if indicated): No VAP protocol (if indicated): Yes DVT prophylaxis: Subcutaneous Heparin GI prophylaxis: PPI Glucose control:  SSI No Central venous access:  N/A Arterial line:  N/A Foley:  N/A Mobility:  bed rest  PT consulted: N/A Last date of multidisciplinary goals of care discussion Will need to discuss with patient and his wife. Palliative care consult may be in order. Code Status:  full code Disposition: ICU   Critical care time:    CRITICAL CARE Performed by: Lanier Clam   Total critical care time: 33 minutes  Critical care time was exclusive of separately billable procedures and treating other patients.  Critical care was necessary to treat or prevent imminent or life-threatening deterioration.  Critical care was time spent personally by me on the following activities: development of treatment plan with patient and/or surrogate as well as nursing,  discussions with consultants, evaluation of patient's response to treatment, examination of patient, obtaining history from patient or surrogate, ordering and performing treatments and interventions, ordering and review of laboratory studies, ordering and review of radiographic studies, pulse oximetry and re-evaluation of patient's condition.  PCCM on call pager [phone] until 7pm. Please call Elink 7p-7a. [phone]

## 2020-07-21 NOTE — Telephone Encounter (Signed)
Patient currently in hospital , has an appointment for 07/23/20 , wife will call 4/13 to keep or cancel appointment

## 2020-07-21 NOTE — ED Provider Notes (Addendum)
[facility] DEPT Provider Note: Steve Spurling, MD, FACEP  CSN: [id number] MRN: [id number] ARRIVAL: 07/21/20 at [place]: [id number]   CHIEF COMPLAINT  Shortness of Breath   HISTORY OF PRESENT ILLNESS  07/21/20 2:10 AM Steve [name] is a 65 y.o. male who was discharged home yesterday morning after admission 07/14/2020 for bilateral pneumonia.  This is in the context of treatment for Hodgkin's lymphoma.  He is also trach dependent since Covid infection resulting in ARDS with resultant pulmonary fibrosis.  He was brought from home by EMS after his wife reported she was unable to get his oxygen saturation above 70% despite suctioning.  EMS provided 2 DuoNeb treatments and increased his trach collar to 15 L with oxygen saturation up to the low 90s.    Past Medical History:  Diagnosis Date  . Abscess of muscle 08/10/2011   staph infection of right hip   . Acute on chronic respiratory failure with hypoxia ([place])   . Acute respiratory distress syndrome (ARDS) due to COVID-19 virus ([place])   . Anxiety   . COVID-19 virus infection   . Diabetes mellitus without complication ([place])   . GERD (gastroesophageal reflux disease)    rare reflux - no meds for reflux - NO PROBLEM IN PAST SEVERAL YRS  . HCAP (healthcare-associated pneumonia)   . Healthcare-associated pneumonia   . Hip dysplasia, congenital    no surgery as a child for hip dysplasia - has had bilateral hip replacements as an adult  . Hodgkin lymphoma ([place])   . Hodgkin's lymphoma ([place])   . Hypertension   . Pancreatitis   . Pneumothorax, acute   . Postoperative anemia due to acute blood loss 09/07/2012  . Septic arthritis of hip ([place]) 09/05/2012   PT'S TOTAL HIP JOINT REMOVED - ANTIBIOTIC SPACE PLACED AND PT HAS FINISHED IV ANTIBIOTICS ( PICC LINE REMOVED)  . Sleep apnea    USES CPAP    Past Surgical History:  Procedure Laterality Date  . COLONOSCOPY  04/26/2007  . HERNIA REPAIR     inguinal hernia x3  . IR GASTROSTOMY TUBE  MOD SED  08/22/2019  . IR IMAGING GUIDED PORT INSERTION  03/29/2019  . JOINT REPLACEMENT  2002 & 2007   bilateral hip replacement  . LYMPH NODE BIOPSY Right 03/20/2019   Procedure: DEEP RIGHT SUPRACLAVICULAR LYMPH NODE EXCISION;  Surgeon: Fanny Skates, MD;  Location: [facility];  Service: General;  Laterality: Right;  . MULTIPLE EXTRACTIONS WITH ALVEOLOPLASTY  07/28/2011   Procedure: MULTIPLE EXTRACION WITH ALVEOLOPLASTY;  Surgeon: Lenn Cal, DDS;  Location: [facility];  Service: Oral Surgery;  Laterality: N/A;  Extraction of tooth #'s 2,3,4,5,6,11,12,13,15,19,22 with alveoloplasty.  . shoulder repair - right for separation of shoulder    . TEE WITHOUT CARDIOVERSION  07/29/2011   Procedure: TRANSESOPHAGEAL ECHOCARDIOGRAM (TEE);  Surgeon: Josue Hector, MD;  Location: [facility];  Service: Cardiovascular;  Laterality: N/A;  . TOTAL HIP REVISION Right 09/05/2012   Procedure: RIGHT HIP RESECTION ARTHROPLASTY WITH ANTIBIOTIC SPACERS;  Surgeon: [name], MD;  Location: [facility];  Service: Orthopedics;  Laterality: Right;  . TOTAL HIP REVISION Right 11/30/2012   Procedure: RIGHT TOTAL HIP ARTHROPLASTY REIMPLANTATION;  Surgeon: [name], MD;  Location: [facility];  Service: Orthopedics;  Laterality: Right;    Family History  Problem Relation Age of Onset  . Melanoma Mother   . Heart attack Mother   . Hyperlipidemia Neg Hx   . Sudden death Neg Hx   .  Hypertension Neg Hx   . Diabetes Neg Hx     Social History   Tobacco Use  . Smoking status: Never Smoker  . Smokeless tobacco: Never Used  Vaping Use  . Vaping Use: Never used  Substance Use Topics  . Alcohol use: Yes    Comment: rare beer. 5 times a year at most. 2 beers when he drinks.  . Drug use: No    Prior to Admission medications   Medication Sig Start Date End Date Taking? Authorizing Provider  albuterol (PROVENTIL) (2.5 MG/3ML) 0.083% nebulizer solution Take 3 mLs (2.5 mg total) by nebulization every  2 (two) hours as needed for wheezing. 07/13/20  Yes Saguier, Percell Miller, PA-C  Amino Acids-Protein Hydrolys (FEEDING SUPPLEMENT, PRO-STAT SUGAR FREE 64,) LIQD Place 30 mLs into feeding tube daily.   Yes [provider]  calamine lotion Apply topically as needed for itching. 07/18/20  Yes Pokhrel, Laxman, MD  chlorhexidine (PERIDEX) 0.12 % solution 15 mLs by Mouth Rinse route 2 (two) times daily. Patient taking differently: 15 mLs by Mouth Rinse route 2 (two) times daily as needed (mouth pain). 04/17/20  Yes Florencia Reasons, MD  Cholecalciferol (VITAMIN D3) 10 MCG/ML LIQD Give 400 Units by tube daily. Patient taking differently: Place 400 Units into feeding tube daily. 04/17/20  Yes Florencia Reasons, MD  enoxaparin (LOVENOX) 40 MG/0.4ML injection Inject 0.4 mLs (40 mg total) into the skin daily. 07/11/20  Yes Saguier, Percell Miller, PA-C  furosemide (LASIX) 20 MG tablet Place 1 tablet (20 mg total) into feeding tube daily. 07/11/20  Yes Saguier, Percell Miller, PA-C  guaiFENesin (ROBITUSSIN) 100 MG/5ML SOLN Place 5 mLs (100 mg total) into feeding tube every 4 (four) hours as needed for cough or to loosen phlegm. 07/18/20  Yes Pokhrel, Laxman, MD  lidocaine-prilocaine (EMLA) cream Apply to affected area once Patient taking differently: Apply 1 application topically daily as needed (access port). Apply to affected area once 07/09/20  Yes Ennever, Rudell Cobb, MD  loperamide HCl (IMODIUM) 1 MG/7.5ML suspension Place 15 mLs (2 mg total) into feeding tube as needed for diarrhea or loose stools (maximum 16mg  per day). 07/20/20  Yes Pokhrel, Laxman, MD  LORazepam (ATIVAN) 0.5 MG tablet Take 1 tablet (0.5 mg total) by mouth every 6 (six) hours as needed (Nausea or vomiting). 07/09/20  Yes Volanda Napoleon, MD  Multiple Vitamin (MULTIVITAMIN) LIQD Place 15 mLs into feeding tube daily. 08/28/19  Yes Ollis, Cleaster Corin, NP  Nutritional Supplements (FEEDING SUPPLEMENT, OSMOLITE 1.2 CAL,) LIQD Place 1,000 mLs into feeding tube continuous. Patient taking  differently: Place 70 mLs into feeding tube continuous. 04/17/20  Yes Florencia Reasons, MD  sertraline (ZOLOFT) 50 MG tablet GIVE 1 TAB PER TUBE DAILY 07/07/20  Yes Saguier, Percell Miller, PA-C  traZODone (DESYREL) 100 MG tablet Place 1 tablet (100 mg total) into feeding tube at bedtime. 07/11/20  Yes Saguier, Percell Miller, PA-C  triamcinolone (KENALOG) 0.1 % Apply 1 application topically 2 (two) times daily as needed (itching).   Yes [provider]  Water For Irrigation, Sterile (FREE WATER) SOLN Place 100 mLs into feeding tube every 4 (four) hours. Patient taking differently: Place 120 mLs into feeding tube every 4 (four) hours. 04/17/20  Yes Florencia Reasons, MD  acetaminophen (TYLENOL) 160 MG/5ML solution Place 20.3 mLs (650 mg total) into feeding tube every 4 (four) hours as needed for mild pain or fever (temp > 101.5). Patient not taking: Reported on 07/21/2020 08/27/19   Donita Brooks, NP  amoxicillin-clavulanate (AUGMENTIN) 875-125 MG tablet  Take 1 tablet by mouth 2 (two) times daily for 5 days. 07/18/20 07/23/20  Pokhrel, Corrie Mckusick, MD  Chlorhexidine Gluconate Cloth 2 % PADS Apply 6 each topically daily. Patient not taking: No sig reported 04/18/20   Florencia Reasons, MD  magnesium oxide (MAG-OX) 400 (241.3 Mg) MG tablet Take 1 tablet (400 mg total) by mouth 2 (two) times daily for 10 days. 07/18/20 07/28/20  Pokhrel, Corrie Mckusick, MD  ondansetron (ZOFRAN) 4 MG tablet Take 1 tablet (4 mg total) by mouth every 6 (six) hours as needed for nausea. 07/18/20   Pokhrel, Corrie Mckusick, MD    Allergies Patient has no known allergies.   REVIEW OF SYSTEMS  Negative except as noted here or in the History of Present Illness.   PHYSICAL EXAMINATION  Initial Vital Signs Blood pressure 111/69, pulse (!) 110, resp. rate 14, SpO2 92 %.  Examination General: Well-developed, well-nourished male in no acute distress; appearance consistent with age of record HENT: normocephalic; atraumatic Eyes: pupils equal, round and reactive to light; left lateral  strabismus Neck: supple Heart: regular rate and rhythm; tachycardia Lungs: Tachypnea; rattly cough; trach collar in place over tracheostomy tube Abdomen: soft; nondistended; nontender; bowel sounds present Extremities: No deformity; full range of motion; pulses normal Neurologic: Awake but lethargic; motor function intact in all extremities and symmetric; no facial droop Skin: Warm and dry Psychiatric: Flat affect   RESULTS  Summary of this visit's results, reviewed and interpreted by myself:   EKG Interpretation  Date/Time:  Tuesday July 21 2020 01:30:27 EDT Ventricular Rate:  115 PR Interval:  123 QRS Duration: 117 QT Interval:  339 QTC Calculation: 469 R Axis:   267 Text Interpretation: Sinus tachycardia Nonspecific IVCD with LAD Low voltage, extremity and precordial leads Artifact in lead(s) I II aVR and baseline wander in lead(s) II III aVF V6 limiting interpretation Confirmed by Bary Limbach, Jenny Reichmann [phone]) on 07/21/2020 2:20:45 AM      Laboratory Studies: Results for orders placed or performed during the hospital encounter of 07/21/20 (from the past 24 hour(s))  Blood gas, arterial (at [facility])     Status: Abnormal   Collection Time: 07/21/20  2:30 AM  Result Value Ref Range   O2 Content 98.0 L/min   Delivery systems TRACH COLLAR/TRACH TUBE    pH, Arterial 7.315 (L) 7.350 - 7.450   pCO2 arterial 82.7 (HH) 32.0 - 48.0 mmHg   pO2, Arterial 97.0 83.0 - 108.0 mmHg   Bicarbonate 40.9 (H) 20.0 - 28.0 mmol/L   Acid-Base Excess 13.2 (H) 0.0 - 2.0 mmol/L   O2 Saturation 97.7 %   Patient temperature 80.9   Basic metabolic panel     Status: Abnormal   Collection Time: 07/21/20  3:00 AM  Result Value Ref Range   Sodium 139 135 - 145 mmol/L   Potassium 4.9 3.5 - 5.1 mmol/L   Chloride 94 (L) 98 - 111 mmol/L   CO2 39 (H) 22 - 32 mmol/L   Glucose, Bld 115 (H) 70 - 99 mg/dL   BUN 25 (H) 8 - 23 mg/dL   Creatinine, Ser 0.69 0.61 - 1.24 mg/dL   Calcium 9.4 8.9 - 10.3 mg/dL   GFR,  Estimated >60 >60 mL/min   Anion gap 6 5 - 15  CBC with Differential/Platelet     Status: Abnormal   Collection Time: 07/21/20  3:00 AM  Result Value Ref Range   WBC 15.9 (H) 4.0 - 10.5 K/uL   RBC 3.14 (L) 4.22 - 5.81 MIL/uL  Hemoglobin 8.3 (L) 13.0 - 17.0 g/dL   HCT 28.3 (L) 39.0 - 52.0 %   MCV 90.1 80.0 - 100.0 fL   MCH 26.4 26.0 - 34.0 pg   MCHC 29.3 (L) 30.0 - 36.0 g/dL   RDW 16.4 (H) 11.5 - 15.5 %   Platelets 277 150 - 400 K/uL   nRBC 0.0 0.0 - 0.2 %   Neutrophils Relative % 73 %   Neutro Abs 11.5 (H) 1.7 - 7.7 K/uL   Lymphocytes Relative 9 %   Lymphs Abs 1.5 0.7 - 4.0 K/uL   Monocytes Relative 9 %   Monocytes Absolute 1.4 (H) 0.1 - 1.0 K/uL   Eosinophils Relative 8 %   Eosinophils Absolute 1.3 (H) 0.0 - 0.5 K/uL   Basophils Relative 0 %   Basophils Absolute 0.0 0.0 - 0.1 K/uL   Immature Granulocytes 1 %   Abs Immature Granulocytes 0.20 (H) 0.00 - 0.07 K/uL   Imaging Studies: DG Chest Port 1 View  Result Date: 07/21/2020 CLINICAL DATA:  Dyspnea EXAM: PORTABLE CHEST 1 VIEW COMPARISON:  07/14/2020 FINDINGS: Tracheostomy and left internal jugular chest port with its tip within the right atrium are unchanged. Lung volumes are extremely small with asymmetric right-sided volume loss with elevation of the right hemidiaphragm, however, pulmonary insufflation remain stable since prior examination. Superimposed coarse pulmonary infiltrates appears slightly improved since prior examination, likely infectious or inflammatory in nature. No pneumothorax or pleural effusion. Cardiac size is within normal limits when accounting for poor pulmonary insufflation. Bilateral hilar enlargement is unchanged. IMPRESSION: Stable pulmonary hypoinflation. Slight interval improvement in bilateral coarse pulmonary infiltrates, likely infectious or inflammatory in nature. Superimposed peripheral pulmonary infiltrates in keeping with underlying interstitial lung disease better appreciated on prior CT  examination of 05/25/2020 are again noted. Electronically Signed   By: Fidela Salisbury MD   On: 07/21/2020 01:23    ED COURSE and MDM  Nursing notes, initial and subsequent vitals signs, including pulse oximetry, reviewed and interpreted by myself.  Vitals:   07/21/20 0245 07/21/20 0300 07/21/20 0330 07/21/20 0400  BP: 126/68 113/72 109/62 96/66  Pulse: (!) 115 (!) 115 (!) 103 92  Resp: (!) 24 (!) 34 18 20  SpO2: 91% 90% 98% 99%  Height:  5\' 7"  (1.702 m)     Medications  ondansetron (ZOFRAN) injection 4 mg (4 mg Intravenous Not Given 07/21/20 0302)  heparin injection 5,000 Units (has no administration in time range)  pantoprazole (PROTONIX) injection 40 mg (has no administration in time range)  docusate sodium (COLACE) capsule 100 mg (has no administration in time range)  polyethylene glycol (MIRALAX / GLYCOLAX) packet 17 g (has no administration in time range)   2:30 AM Patient has had suctioning by both respiratory therapy and his nurse.  He states he still feels like his breathing is not back to baseline.  3:10 AM Will place patient on ventilator for hypercapnic respiratory failure with mild respiratory acidosis.  4:20 AM Patient comfortable on vent after respiratory therapy changed out uncuffed Shiley for cuffed Shiley.  Pulmonary critical care to admit.  PROCEDURES  Procedures CRITICAL CARE Performed by: Karen Chafe Aubria Vanecek Total critical care time: 30 minutes Critical care time was exclusive of separately billable procedures and treating other patients. Critical care was necessary to treat or prevent imminent or life-threatening deterioration. Critical care was time spent personally by me on the following activities: development of treatment plan with patient and/or surrogate as well as nursing, discussions with consultants, evaluation of  patient's response to treatment, examination of patient, obtaining history from patient or surrogate, ordering and performing treatments and  interventions, ordering and review of laboratory studies, ordering and review of radiographic studies, pulse oximetry and re-evaluation of patient's condition.   ED DIAGNOSES     ICD-10-CM   1. Acute hypercapnic respiratory failure ([place])  J96.02        Valita Righter, Jenny Reichmann, MD 07/21/20 0421    Shanon Rosser, MD 07/21/20 [phone]

## 2020-07-21 NOTE — Progress Notes (Signed)
Initial Nutrition Assessment  DOCUMENTATION CODES:   Non-severe (moderate) malnutrition in context of chronic illness  INTERVENTION:  - will order Osmolite 1.2 @ 65 ml/hr with 45 ml Prosource TF TID and 100 ml free water every 4 hours.    NUTRITION DIAGNOSIS:   Moderate Malnutrition related to chronic illness,cancer and cancer related treatments as evidenced by moderate fat depletion,moderate muscle depletion,severe muscle depletion.  GOAL:   Patient will meet greater than or equal to 90% of their needs  MONITOR:   TF tolerance,Labs,Weight trends,Skin  REASON FOR ASSESSMENT:   Consult Enteral/tube feeding initiation and management  ASSESSMENT:   65 y.o. male with medical hx of GERD, DM, HTN, Hodgkin's lymphoma, COVID-19, and congenital hip dysplasia. He has a chronic trach and PEG. He was discharged from [facility] on 4/11 and returned to the ED a few hours later due to dyspnea and inability to maintain O2 sats.  Patient was discussed in rounds this AM. He is well known to this RD from previous hospitalizations.   Patient with trach and PEG and is NPO at this time. Patient does sometimes take sips of liquids at home, but nothing substantial and no solid foods.   His TF regimen has not changed: Osmolite 1.2 @ 65 ml/hr with 45 ml Prosource TF (or equivalent) TID and 100 ml free water x6/day. This regimen provides 1992 kcal, 119 grams protein, and 1879 ml free water.   No issues with PEG prior to this admission or most recent admission.   Weight today is 141 lb and weight on 05/29/20 was 149 lb. This indicates 8 lb weight loss (5.4% body weight) in the past 2 months; not significant for time frame.    Labs reviewed; Na: 133 mmol/l, Cl: 92 mmol/l. Medications reviewed.     NUTRITION - FOCUSED PHYSICAL EXAM:  Flowsheet Row Most Recent Value  Orbital Region Mild depletion  Upper Arm Region Moderate depletion  Thoracic and Lumbar Region Unable to assess  Buccal Region Moderate  depletion  Temple Region Moderate depletion  Clavicle Bone Region Moderate depletion  Clavicle and Acromion Bone Region Moderate depletion  Scapular Bone Region Unable to assess  Dorsal Hand Mild depletion  Patellar Region Severe depletion  Anterior Thigh Region Severe depletion  Posterior Calf Region Severe depletion  Edema (RD Assessment) None  Hair Reviewed  Eyes Reviewed  Mouth Reviewed  Skin Reviewed  Nails Reviewed       Diet Order:   Diet Order            Diet NPO time specified  Diet effective now                 EDUCATION NEEDS:   No education needs have been identified at this time  Skin:  Skin Assessment: Skin Integrity Issues: Skin Integrity Issues:: Stage II Stage II: buttocks  Last BM:  4/12 (type 7 x1)  Height:   Ht Readings from Last 1 Encounters:  07/21/20 5' 6.5" (1.689 m)    Weight:   Wt Readings from Last 1 Encounters:  07/21/20 64.2 kg    Estimated Nutritional Needs:  Kcal:  1900-2100 kcal Protein:  100-115 grams Fluid:  >/= 2 L/day      Jarome Matin, MS, RD, LDN, CNSC Inpatient Clinical Dietitian RD pager # available in [facility]  After hours/weekend pager # available in [facility]

## 2020-07-21 NOTE — Progress Notes (Addendum)
eLink Physician-Brief Progress Note Patient Name: Steve [name] DOB: Jun 23, 1955 MRN: [id number]   Date of Service  07/21/2020  HPI/Events of Note  ABG shows compensated respiratory acidosis. Patient admitted with altered mental status and acute on chronic hypoxemic / hypercapnic respiratory failure and placed on the ventilator with improvement in his respiratory status back towards his baseline of chronic hypercapnic respiratory failure post-Covid infection with pneumonia, ARDS and pulmonary fibrosis.  eICU Interventions  New Patient Evaluation completed. No intervention indicated.        [name] Myka Lukins 07/21/2020, 6:20 AM

## 2020-07-21 NOTE — ED Notes (Signed)
Patients trach was suctioned.

## 2020-07-22 ENCOUNTER — Encounter (HOSPITAL_COMMUNITY): Payer: Self-pay | Admitting: Pulmonary Disease

## 2020-07-22 DIAGNOSIS — J9621 Acute and chronic respiratory failure with hypoxia: Secondary | ICD-10-CM

## 2020-07-22 DIAGNOSIS — J9622 Acute and chronic respiratory failure with hypercapnia: Secondary | ICD-10-CM | POA: Diagnosis not present

## 2020-07-22 LAB — GLUCOSE, CAPILLARY
Glucose-Capillary: 111 mg/dL — ABNORMAL HIGH (ref 70–99)
Glucose-Capillary: 121 mg/dL — ABNORMAL HIGH (ref 70–99)
Glucose-Capillary: 125 mg/dL — ABNORMAL HIGH (ref 70–99)
Glucose-Capillary: 157 mg/dL — ABNORMAL HIGH (ref 70–99)
Glucose-Capillary: 175 mg/dL — ABNORMAL HIGH (ref 70–99)
Glucose-Capillary: 193 mg/dL — ABNORMAL HIGH (ref 70–99)

## 2020-07-22 LAB — BASIC METABOLIC PANEL
Anion gap: 3 — ABNORMAL LOW (ref 5–15)
Anion gap: 4 — ABNORMAL LOW (ref 5–15)
BUN: 14 mg/dL (ref 8–23)
BUN: 19 mg/dL (ref 8–23)
CO2: 26 mmol/L (ref 22–32)
CO2: 39 mmol/L — ABNORMAL HIGH (ref 22–32)
Calcium: 5.7 mg/dL — CL (ref 8.9–10.3)
Calcium: 9.4 mg/dL (ref 8.9–10.3)
Chloride: 112 mmol/L — ABNORMAL HIGH (ref 98–111)
Chloride: 92 mmol/L — ABNORMAL LOW (ref 98–111)
Creatinine, Ser: 0.32 mg/dL — ABNORMAL LOW (ref 0.61–1.24)
Creatinine, Ser: 0.65 mg/dL (ref 0.61–1.24)
GFR, Estimated: >60 mL/min (ref >60)
GFR, Estimated: >60 mL/min (ref >60)
Glucose, Bld: 118 mg/dL — ABNORMAL HIGH (ref 70–99)
Glucose, Bld: 161 mg/dL — ABNORMAL HIGH (ref 70–99)
Potassium: 2.8 mmol/L — ABNORMAL LOW (ref 3.5–5.1)
Potassium: 5.4 mmol/L — ABNORMAL HIGH (ref 3.5–5.1)
Sodium: 141 mmol/L (ref 135–145)
Sodium: 135 mmol/L (ref 135–145)

## 2020-07-22 LAB — MAGNESIUM
Magnesium: 1.1 mg/dL — ABNORMAL LOW (ref 1.7–2.4)
Magnesium: 2.9 mg/dL — ABNORMAL HIGH (ref 1.7–2.4)
Magnesium: 2.5 mg/dL — ABNORMAL HIGH (ref 1.7–2.4)

## 2020-07-22 LAB — CBC
HCT: 25.1 % — ABNORMAL LOW (ref 39.0–52.0)
Hemoglobin: 7.3 g/dL — ABNORMAL LOW (ref 13.0–17.0)
MCH: 26.5 pg (ref 26.0–34.0)
MCHC: 29.1 g/dL — ABNORMAL LOW (ref 30.0–36.0)
MCV: 91.3 fL (ref 80.0–100.0)
Platelets: 229 10*3/uL (ref 150–400)
RBC: 2.75 MIL/uL — ABNORMAL LOW (ref 4.22–5.81)
RDW: 16.6 % — ABNORMAL HIGH (ref 11.5–15.5)
WBC: 11.7 10*3/uL — ABNORMAL HIGH (ref 4.0–10.5)
nRBC: 0 % (ref 0.0–0.2)

## 2020-07-22 MED ORDER — CALCIUM GLUCONATE-NACL 2-0.675 GM/100ML-% IV SOLN
2.0000 g | Freq: Once | INTRAVENOUS | Status: AC
  Administered 2020-07-22: 2000 mg via INTRAVENOUS
  Filled 2020-07-22: qty 100

## 2020-07-22 MED ORDER — MAGNESIUM SULFATE 2 GM/50ML IV SOLN
2.0000 g | Freq: Once | INTRAVENOUS | Status: AC
  Administered 2020-07-22: 2 g via INTRAVENOUS
  Filled 2020-07-22: qty 50

## 2020-07-22 MED ORDER — OSMOLITE 1.2 CAL PO LIQD
1000.0000 mL | ORAL | Status: DC
  Administered 2020-07-22 – 2020-07-23 (×2): 1000 mL

## 2020-07-22 MED ORDER — POTASSIUM CHLORIDE 10 MEQ/100ML IV SOLN
10.0000 meq | INTRAVENOUS | Status: AC
Start: 2020-07-22 — End: 2020-07-22
  Administered 2020-07-22 (×4): 10 meq via INTRAVENOUS
  Filled 2020-07-22 (×4): qty 100

## 2020-07-22 MED ORDER — ADULT MULTIVITAMIN W/MINERALS CH: 1.0000 | ORAL_TABLET | Freq: Every day | ORAL | Status: DC

## 2020-07-22 MED ORDER — TRIAMCINOLONE ACETONIDE 0.1 % EX CREA
1.0000 "application " | TOPICAL_CREAM | Freq: Two times a day (BID) | CUTANEOUS | Status: DC | PRN
  Filled 2020-07-22: qty 454
  Filled 2020-07-22 (×3): qty 15

## 2020-07-22 MED ORDER — ADULT MULTIVITAMIN LIQUID CH
15.0000 mL | Freq: Every day | ORAL | Status: DC
  Administered 2020-07-22 – 2020-07-28 (×7): 15 mL
  Filled 2020-07-22 (×7): qty 15

## 2020-07-22 MED ORDER — MAGNESIUM SULFATE 4 GM/100ML IV SOLN
4.0000 g | Freq: Once | INTRAVENOUS | Status: AC
  Administered 2020-07-22: 4 g via INTRAVENOUS
  Filled 2020-07-22: qty 100

## 2020-07-22 MED ORDER — DIPHENHYDRAMINE HCL 12.5 MG/5ML PO ELIX
25.0000 mg | ORAL_SOLUTION | Freq: Once | ORAL | Status: AC
  Administered 2020-07-22: 25 mg via ORAL
  Filled 2020-07-22: qty 10

## 2020-07-22 MED ORDER — POTASSIUM CHLORIDE 20 MEQ PO PACK
40.0000 meq | PACK | Freq: Once | ORAL | Status: AC
  Administered 2020-07-22: 40 meq
  Filled 2020-07-22: qty 2

## 2020-07-22 MED ORDER — CALAMINE EX LOTN
TOPICAL_LOTION | CUTANEOUS | Status: DC | PRN
  Filled 2020-07-22: qty 118

## 2020-07-22 NOTE — Progress Notes (Signed)
eLink Physician-Brief Progress Note Patient Name: YOSGART PAVEY DOB: 08-08-1955 MRN: [id number]   Date of Service  07/22/2020  HPI/Events of Note  Hypocalcemia - Ca++ = 5.7 which corrects to 7.06 (Low) given Albumin = 2.3.  eICU Interventions  Will replace Ca++.     Intervention Category Major Interventions: Electrolyte abnormality - evaluation and management  Levoy Geisen Eugene 07/22/2020, 7:01 AM

## 2020-07-22 NOTE — Progress Notes (Signed)
K+ 2.8, Mg 1.1 Replaced per protocol

## 2020-07-22 NOTE — Progress Notes (Addendum)
NAME:  Steve [name], MRN:  [id number], DOB:  July 31, 1955, LOS: 1 ADMISSION DATE:  07/21/2020, CONSULTATION DATE:  07/21/20 REFERRING MD:  Molpus CHIEF COMPLAINT:  Dyspnea   History of Present Illness:  65 y/o M who has chronic trach after recent prolonged hospitalization in 2021 with bilateral infiltrates related to COVID c/b ARDS and pulmonary fibrosis, +/- pneumonitis from Brentuximab.    He presented to [facility] ED 07/20/20 with dyspnea and inability to maintain O2 sats above 70% despite suctioning. EMS placed him on 15L O2 via trach collar and sats improved to 90%.  In ED, he was in distress on ATC and trach was ultimately changed to 6 cuffed shiley in order to place him on the ventilator.  PCCM subsequently called for admission.  He was admitted to [facility] from 4/5 through 4/11 for HCAP.  He was treated with a course of vanc and cefepime and discharged on Augmentin.  He is followed by Oncology for Hodgkin Lymphoma and received Brentuximab vedotin on 07/10/20.   Pertinent  Medical History:  Hodgkin's Lymphoma  OSA  COVID - prolonged on vent, ARDS 2021 with trach, pneumothorax s/p CT's GERD HTN DM   Significant Hospital Events: Including procedures, antibiotic start and stop dates in addition to other pertinent events    4/12 Admit with hypoxic resp failure, sputum cultured, hypotensive but responded to IVF's.  ABG with largely compensated hypercarbic resp failure   4/13 On ATC 60%  Interim History / Subjective:  Afebrile  K+, Mg+, Ca+ low on am labs, replaced per [facility] RN reports thick secretions from trach Pt c/o itching, SOB  Objective:  Blood pressure (!) 103/58, pulse 86, temperature 98.7 F (37.1 C), temperature source Axillary, resp. rate 14, height 5' 6.5" (1.689 m), weight 64.2 kg, SpO2 97 %.    FiO2 (%):  [40 %] 40 %   Intake/Output Summary (Last 24 hours) at 07/22/2020 1158 Last data filed at 07/22/2020 1058 Gross per 24 hour  Intake 1355.31 ml  Output 2600 ml  Net  -1244.69 ml   Filed Weights   07/21/20 0544  Weight: 64.2 kg    Examination: General: chronically ill appearing adult male lying in bed in NAD   HEENT: MM pink/moist, trach midline c/d/i, anicteric  Neuro: sleeping, awakens to voice, nods / appropriate & drifts back to sleep CV: s1s2 RRR, no m/r/g PULM: non-labored on ATC, lungs bilaterally coarse with rhonchi  GI: soft, bsx4 active, PEG in place  Extremities: warm/dry, no edema  Skin: no rashes or lesions        Resolved Hospital Problem list:    Assessment & Plan:   Acute on Chronic Hypercapnic and Hypoxemic Respiratory Failure 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 pneumonitits reaction to the brentuximab vedotin given on 4/1.   He has had similar admissions in the past after therapy for Hodgkin's.  In addition, he has new right sided pleural rind which could be related to recent infection / contributing to restrictive component. Note enlarged pulmonary trunk, additional component of probable pulmonary hypertension. 8L baseline O2 need.  -continue trach collar  -wean O2 for sats >88% -if no improvement, could consider FOB to assess for lymphocytic component to fluid  -mobilize -follow intermittent CXR   Hypotension Orthostatic, hypovolemia. Required 1.5L LR -follow BP trend  Hypokalemia Hypomagnesemia  Hypocalcemia  -? erroneous read  give multiple changes -repeat labs now  Acute Metabolic Encephalopathy In setting of respiratory failure or metabolic encephalopathy, improved -improved, supportive care   Hodgkin's Lymphoma -ONC following -no acute treatment changes at this time   Best practice (evaluated daily):  Diet:  TF Pain/Anxiety/Delirium protocol (if indicated): No VAP protocol (if indicated): Yes DVT prophylaxis:  Subcutaneous Heparin GI prophylaxis: PPI Glucose control:  SSI No Central venous access:  N/A Arterial line:  N/A Foley:  N/A Mobility:  bed rest  PT consulted: N/A Last date of multidisciplinary goals of care discussion: will update wife on arrival Code Status:  full code Disposition: SDU   Critical Care Time: [place], MSN, APRN, NP-C, AGACNP-BC [facility] 07/22/2020, 12:18 PM   Please see [email or web address] for pager details.   From 7A-7P if no response, please call [phone] After hours, please call ELink [phone]

## 2020-07-22 NOTE — Progress Notes (Signed)
NUTRITION NOTE  Full RD assessment 4/12. Patient discussed in rounds this AM. K: 2.8 mmol/l and Mg: 1.1 mg/dl with repletions ordered.   He is receiving TF regimen at goal rate: Osmolite 1.2 @ 65 ml/hr with 45 ml Prosource TF TID and 100 ml free water every 4 hours. This regimen is providing 1992 kcal, 119 grams protein, 246 grams carbohydrate, and 1879 ml free water.   Alerted RN to plan to adjust TF regimen given hypokalemia and hypomagnesemia. Will adjust TF regimen to Osmolite 1.2 @ 35 ml/hr with 45 ml Prosource TF TID and 100 ml free water every 4 hours. This regimen will provide 1128 kcal, 80 grams protein, 132 grams carbohydrate, and 1289 ml free water.   Will also order 1 tablet multivitamin with minerals/day given TF rate of <42 ml/hr. Will continue to follow per protocol and re-advance TF regimen as appropriate with improvement in K and Mg.       Jarome Matin, MS, RD, LDN, CNSC Inpatient Clinical Dietitian RD pager # available in [place]  After hours/weekend pager # available in [facility]

## 2020-07-22 NOTE — Progress Notes (Addendum)
Changed ATC set up- uneventful. PT denies need for trach suctioning at this time.

## 2020-07-23 ENCOUNTER — Telehealth: Payer: BC Managed Care – PPO | Admitting: Medical

## 2020-07-23 DIAGNOSIS — J9622 Acute and chronic respiratory failure with hypercapnia: Secondary | ICD-10-CM | POA: Diagnosis not present

## 2020-07-23 DIAGNOSIS — J9621 Acute and chronic respiratory failure with hypoxia: Secondary | ICD-10-CM | POA: Diagnosis not present

## 2020-07-23 LAB — CBC
HCT: 26.7 % — ABNORMAL LOW (ref 39.0–52.0)
Hemoglobin: 7.9 g/dL — ABNORMAL LOW (ref 13.0–17.0)
MCH: 26.3 pg (ref 26.0–34.0)
MCHC: 29.6 g/dL — ABNORMAL LOW (ref 30.0–36.0)
MCV: 89 fL (ref 80.0–100.0)
Platelets: 311 10*3/uL (ref 150–400)
RBC: 3 MIL/uL — ABNORMAL LOW (ref 4.22–5.81)
RDW: 16.7 % — ABNORMAL HIGH (ref 11.5–15.5)
WBC: 12.4 10*3/uL — ABNORMAL HIGH (ref 4.0–10.5)
nRBC: 0 % (ref 0.0–0.2)

## 2020-07-23 LAB — GLUCOSE, CAPILLARY
Glucose-Capillary: 122 mg/dL — ABNORMAL HIGH (ref 70–99)
Glucose-Capillary: 127 mg/dL — ABNORMAL HIGH (ref 70–99)
Glucose-Capillary: 134 mg/dL — ABNORMAL HIGH (ref 70–99)
Glucose-Capillary: 154 mg/dL — ABNORMAL HIGH (ref 70–99)
Glucose-Capillary: 158 mg/dL — ABNORMAL HIGH (ref 70–99)
Glucose-Capillary: 165 mg/dL — ABNORMAL HIGH (ref 70–99)

## 2020-07-23 LAB — BASIC METABOLIC PANEL
Anion gap: 10 (ref 5–15)
Anion gap: 5 (ref 5–15)
BUN: 10 mg/dL (ref 8–23)
BUN: 16 mg/dL (ref 8–23)
CO2: 25 mmol/L (ref 22–32)
CO2: 39 mmol/L — ABNORMAL HIGH (ref 22–32)
Calcium: 7.1 mg/dL — ABNORMAL LOW (ref 8.9–10.3)
Calcium: 8.9 mg/dL (ref 8.9–10.3)
Chloride: 89 mmol/L — ABNORMAL LOW (ref 98–111)
Chloride: 96 mmol/L — ABNORMAL LOW (ref 98–111)
Creatinine, Ser: 0.33 mg/dL — ABNORMAL LOW (ref 0.61–1.24)
Creatinine, Ser: 0.65 mg/dL (ref 0.61–1.24)
GFR, Estimated: >60 mL/min (ref >60)
GFR, Estimated: >60 mL/min (ref >60)
Glucose, Bld: 497 mg/dL — ABNORMAL HIGH (ref 70–99)
Glucose, Bld: 115 mg/dL — ABNORMAL HIGH (ref 70–99)
Potassium: 3.9 mmol/L (ref 3.5–5.1)
Potassium: 4.6 mmol/L (ref 3.5–5.1)
Sodium: 124 mmol/L — ABNORMAL LOW (ref 135–145)
Sodium: 140 mmol/L (ref 135–145)

## 2020-07-23 LAB — MRSA PCR SCREENING: MRSA by PCR: NEGATIVE

## 2020-07-23 LAB — CULTURE, RESPIRATORY W GRAM STAIN: Culture: NO GROWTH

## 2020-07-23 MED ORDER — OSMOLITE 1.2 CAL PO LIQD
1000.0000 mL | ORAL | Status: DC
  Administered 2020-07-23 – 2020-07-28 (×6): 1000 mL

## 2020-07-23 MED ORDER — INSULIN ASPART 100 UNIT/ML ~~LOC~~ SOLN
0.0000 [IU] | SUBCUTANEOUS | Status: DC
  Administered 2020-07-23: 2 [IU] via SUBCUTANEOUS
  Administered 2020-07-23: 1 [IU] via SUBCUTANEOUS
  Administered 2020-07-23: 2 [IU] via SUBCUTANEOUS
  Administered 2020-07-23: 1 [IU] via SUBCUTANEOUS
  Administered 2020-07-23: 2 [IU] via SUBCUTANEOUS
  Administered 2020-07-23: 1 [IU] via SUBCUTANEOUS
  Administered 2020-07-24: 2 [IU] via SUBCUTANEOUS
  Administered 2020-07-24: 1 [IU] via SUBCUTANEOUS
  Administered 2020-07-24: 2 [IU] via SUBCUTANEOUS
  Administered 2020-07-24: 1 [IU] via SUBCUTANEOUS
  Administered 2020-07-24: 2 [IU] via SUBCUTANEOUS
  Administered 2020-07-25 (×2): 1 [IU] via SUBCUTANEOUS
  Administered 2020-07-25: 2 [IU] via SUBCUTANEOUS
  Administered 2020-07-25 – 2020-07-26 (×4): 1 [IU] via SUBCUTANEOUS
  Administered 2020-07-26: 2 [IU] via SUBCUTANEOUS
  Administered 2020-07-26: 1 [IU] via SUBCUTANEOUS
  Administered 2020-07-27: 2 [IU] via SUBCUTANEOUS
  Administered 2020-07-27 – 2020-07-28 (×5): 1 [IU] via SUBCUTANEOUS
  Administered 2020-07-28: 2 [IU] via SUBCUTANEOUS

## 2020-07-23 MED ORDER — TRAZODONE HCL 50 MG PO TABS
100.0000 mg | ORAL_TABLET | Freq: Every day | ORAL | Status: DC
  Administered 2020-07-23 – 2020-07-27 (×5): 100 mg
  Filled 2020-07-23 (×5): qty 2

## 2020-07-23 MED ORDER — SERTRALINE HCL 50 MG PO TABS
50.0000 mg | ORAL_TABLET | Freq: Every day | ORAL | Status: DC
  Administered 2020-07-24 – 2020-07-28 (×5): 50 mg
  Filled 2020-07-23 (×5): qty 1

## 2020-07-23 MED ORDER — PANTOPRAZOLE SODIUM 40 MG PO PACK
40.0000 mg | PACK | Freq: Every day | ORAL | Status: DC
  Administered 2020-07-23 – 2020-07-27 (×5): 40 mg
  Filled 2020-07-23 (×5): qty 20

## 2020-07-23 MED ORDER — LORAZEPAM 2 MG/ML IJ SOLN
0.5000 mg | Freq: Once | INTRAMUSCULAR | Status: AC
  Administered 2020-07-23: 0.5 mg via INTRAVENOUS
  Filled 2020-07-23: qty 1

## 2020-07-23 NOTE — Progress Notes (Signed)
NAME:  Steve [name], MRN:  [id number], DOB:  10-May-1955, LOS: 2 ADMISSION DATE:  07/21/2020, CONSULTATION DATE:  07/21/20 REFERRING MD:  Molpus CHIEF COMPLAINT:  Dyspnea   History of Present Illness:  65 y/o M who has chronic trach after recent prolonged hospitalization in 2021 with bilateral infiltrates related to COVID c/b ARDS and pulmonary fibrosis, +/- pneumonitis from Brentuximab.    He presented to [facility] ED 07/20/20 with dyspnea and inability to maintain O2 sats above 70% despite suctioning. EMS placed him on 15L O2 via trach collar and sats improved to 90%.  In ED, he was in distress on ATC and trach was ultimately changed to 6 cuffed shiley in order to place him on the ventilator.  PCCM subsequently called for admission.  He was admitted to [facility] from 4/5 through 4/11 for HCAP.  He was treated with a course of vanc and cefepime and discharged on Augmentin.  He is followed by Oncology for Hodgkin Lymphoma and received Brentuximab vedotin on 07/10/20.   Pertinent  Medical History:  Hodgkin's Lymphoma  OSA  COVID - prolonged on vent, ARDS 2021 with trach, pneumothorax s/p CT's GERD HTN DM   Significant Hospital Events: Including procedures, antibiotic start and stop dates in addition to other pertinent events    4/12 Admit with hypoxic resp failure, sputum cultured, hypotensive but responded to IVF's.  ABG with largely compensated hypercarbic resp failure   4/13 On ATC 60%. Multiple electrolyte disturbances.   4/14 ATC 40%, 10L (baseline 8L)  Interim History / Subjective:  Afebrile / WBC 12.4  Pt reports feeling better this am but continues to feel "tight" with breathing at times  Glucose range 115-127 O2 weaned to 40%/10L ATC (pt reports baseline is 8L)  Objective:  Blood pressure (!) 108/56, pulse 86, temperature 97.6 F (36.4 C), temperature source Oral, resp. rate (!) 27, height 5' 6.5" (1.689 m), weight 62.6 kg, SpO2 92 %.    FiO2 (%):  [35 %-40 %] 35 %   Intake/Output  Summary (Last 24 hours) at 07/23/2020 1138 Last data filed at 07/23/2020 1134 Gross per 24 hour  Intake 2441.36 ml  Output 3375 ml  Net -933.64 ml   Filed Weights   07/21/20 0544 07/23/20 0405  Weight: 64.2 kg 62.6 kg    Examination: General: chronically ill appearing adult male lying in bed in NAD  HEENT: MM pink/moist, trach midline c/d/i, anicteric Neuro: AAOx4, able to communicate by mouthing words, MAE CV: s1s2 RRR, no m/r/g PULM: non-labored, mild tachypnea, coarse breath sounds with occasional crackles  GI: soft, bsx4 active, PEG in place Extremities: warm/dry, no edema  Skin: no rashes or lesions        Resolved Hospital Problem list:    Assessment & Plan:   Acute on Chronic Hypercapnic and Hypoxemic Respiratory Failure 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 pneumonitits reaction to the brentuximab vedotin given on 4/1.   He has had similar admissions in the past after therapy for Hodgkin's.  In addition, he has new right sided pleural rind which could be related to recent infection / contributing to restrictive component. Note enlarged pulmonary trunk, additional component of probable pulmonary hypertension. 8L baseline O2 need.  -wean O2 back to baseline -wean for saturations >88% -follow intermittent CXR -stop abx after 4/14 and monitor  -mobilize / PT efforts  Hypotension Orthostatic, hypovolemia. Required 1.5L LR -follow BP trend   Hypokalemia Hypomagnesemia  Hypocalcemia  -follow electrolytes, replace as indicated   Acute Metabolic Encephalopathy In setting of respiratory failure or metabolic encephalopathy, improved -improved, supportive care -delirium prevention measures   Hodgkin's Lymphoma -appreciate ONC -no acute treatment changes at this time -will need  outpatient follow up   Best practice (evaluated daily):  Diet:  TF Pain/Anxiety/Delirium protocol (if indicated): No VAP protocol (if indicated): Yes DVT prophylaxis: Subcutaneous Heparin GI prophylaxis: PPI Glucose control:  SSI No Central venous access:  N/A Arterial line:  N/A Foley:  N/A Mobility:  bed rest  PT consulted: N/A Last date of multidisciplinary goals of care discussion: patient updated 4/14 Code Status:  full code Disposition: SDU, to [place] as of 4/15.  PCCM will be available for trach needs.     Critical Care Time: n/a   Noe Gens, MSN, APRN, NP-C, AGACNP-BC [facility] 07/23/2020, 11:38 AM   Please see [email or web address] for pager details.   From 7A-7P if no response, please call [phone] After hours, please call ELink [phone]

## 2020-07-23 NOTE — Progress Notes (Signed)
eLink Physician-Brief Progress Note Patient Name: GILLERMO POCH DOB: 1956-02-19 MRN: [id number]   Date of Service  07/23/2020  HPI/Events of Note  Hyperglycemia - Blood glucose = 193. Patient is on tube feedings.   eICU Interventions  Plan: 1. Q 4 hour sensitive Novolog SSI.     Intervention Category Major Interventions: Hyperglycemia - active titration of insulin therapy  [name] 07/23/2020, 12:04 AM

## 2020-07-23 NOTE — Progress Notes (Signed)
NUTRITION NOTE  Patient currently receiving Osmolite 1.2 @ 35 ml/hr with 45 ml Prosource TF TID and 100 ml free water every 4 hours.   K and Mg now WDL. Will advance TF regimen back to goal rate. Increase Osmolite 1.2 by 10 ml every 4 hours to reach goal rate of 65 ml/hr.   Patient discussed in rounds this AM. RD will continue to follow per protocol.      Jarome Matin, MS, RD, LDN, CNSC Inpatient Clinical Dietitian RD pager # available in [place]  After hours/weekend pager # available in [facility]

## 2020-07-24 LAB — CBC
HCT: 27.7 % — ABNORMAL LOW (ref 39.0–52.0)
Hemoglobin: 8.2 g/dL — ABNORMAL LOW (ref 13.0–17.0)
MCH: 26.5 pg (ref 26.0–34.0)
MCHC: 29.6 g/dL — ABNORMAL LOW (ref 30.0–36.0)
MCV: 89.6 fL (ref 80.0–100.0)
Platelets: 274 10*3/uL (ref 150–400)
RBC: 3.09 MIL/uL — ABNORMAL LOW (ref 4.22–5.81)
RDW: 17.9 % — ABNORMAL HIGH (ref 11.5–15.5)
WBC: 10.9 10*3/uL — ABNORMAL HIGH (ref 4.0–10.5)
nRBC: 0 % (ref 0.0–0.2)

## 2020-07-24 LAB — BASIC METABOLIC PANEL
Anion gap: 4 — ABNORMAL LOW (ref 5–15)
BUN: 20 mg/dL (ref 8–23)
CO2: 41 mmol/L — ABNORMAL HIGH (ref 22–32)
Calcium: 9.4 mg/dL (ref 8.9–10.3)
Chloride: 94 mmol/L — ABNORMAL LOW (ref 98–111)
Creatinine, Ser: 0.61 mg/dL (ref 0.61–1.24)
GFR, Estimated: >60 mL/min (ref >60)
Glucose, Bld: 142 mg/dL — ABNORMAL HIGH (ref 70–99)
Potassium: 4.8 mmol/L (ref 3.5–5.1)
Sodium: 139 mmol/L (ref 135–145)

## 2020-07-24 LAB — GLUCOSE, CAPILLARY
Glucose-Capillary: 142 mg/dL — ABNORMAL HIGH (ref 70–99)
Glucose-Capillary: 129 mg/dL — ABNORMAL HIGH (ref 70–99)
Glucose-Capillary: 165 mg/dL — ABNORMAL HIGH (ref 70–99)
Glucose-Capillary: 110 mg/dL — ABNORMAL HIGH (ref 70–99)
Glucose-Capillary: 180 mg/dL — ABNORMAL HIGH (ref 70–99)

## 2020-07-24 NOTE — Progress Notes (Signed)
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  Patient presented to ED on 07/20/2020 with complaints of dyspnea and inability to maintain O2 sats above 70% despite suctioning.  EMS placed him on 15 L oxygen via trach collar and sats improved to 90%.  Patient was in distress on trach collar and trach was ultimately changed to 6 cuffed Shiley .  PCCM was consulted.  ABG showed compensated hypercarbic respiratory failure.  Patient was on trach collar 60% FiO2 which has been weaned down to 10 L 40% FiO2.  Patient baseline is 8 L/min.  [facility] assumed care on 07/24/2020.  Subjective    Patient seen and examined, denies chest pain or shortness of breath.   Assessment/Plan:     1. Acute on chronic hypercapnic/hypoxemic respiratory failure-secondary to pulmonary fibrosis from COVID-19 and occupational lung disease, bronchiectasis.  He was empirically started on Zyvox and levofloxacin.  Antibiotics were discontinued as sputum culture showed no growth.  We will continue to wean off oxygen as tolerated.  Patient baseline oxygen is 8 L/min on trach collar. 2. Hypotension-patient was orthostatic on presentation, resolved after he was given IV fluid boluses on 07/21/2020.  At this time blood pressure is stable, continue to monitor. 3. Normocytic anemia-hemoglobin stable at 8.2.  Will transfuse for hemoglobin less than 7. 4. Hodgkin's lymphoma-patient followed by oncology   Scheduled medications:   . Chlorhexidine Gluconate Cloth  6 each Topical Daily  . feeding supplement (OSMOLITE 1.2 CAL)  1,000 mL Per Tube Q24H  . feeding supplement (PROSource TF)  45 mL Per Tube TID  . free water  100 mL Per  Tube Q4H  . heparin  5,000 Units Subcutaneous Q8H  . insulin aspart  0-9 Units Subcutaneous Q4H  . mouth rinse  15 mL Mouth Rinse BID  . multivitamin  15 mL Per Tube Daily  . pantoprazole sodium  40 mg Per Tube QHS  . sertraline  50 mg Per Tube Daily  . sodium chloride flush  10-40 mL Intracatheter Q12H  . traZODone  100 mg Per Tube QHS         Data Reviewed:   CBG:  Recent Labs  Lab 07/23/20 2003 07/23/20 2350 07/24/20 0323 07/24/20 0804 07/24/20 1125  GLUCAP 134* 154* 142* 129* 165*    SpO2: 94 % O2 Flow Rate (L/min): 10 L/min FiO2 (%): 50 %    Vitals:   07/24/20 0800 07/24/20 1000 07/24/20 1135 07/24/20 1141  BP: 94/63 98/62    Pulse: 83 81  90  Resp: 17 (!) 41  (!) 29  Temp: 97.7 F (36.5 C)  98.6 F (37 C)   TempSrc: Axillary  Oral   SpO2: 96% 99%  94%  Weight:      Height:         Intake/Output Summary (Last 24 hours) at 07/24/2020 1254 Last data filed at 07/24/2020 1138 Gross per 24 hour  Intake 1288.16 ml  Output 1650 ml  Net -361.84 ml    04/13 1901 - 04/15 0700 In: 2376  Out: 3300 [[id number]]  Filed Weights   07/21/20 0544 07/23/20 0405 07/24/20 0357  Weight: 64.2 kg 62.6 kg 62.9 kg    CBC:  Recent Labs  Lab 07/21/20 0300 07/21/20 0300  07/22/20 0516 07/23/20 0412 07/24/20 0345  WBC 15.9* 14.3* 11.7* 12.4* 10.9*  HGB 8.3* 7.9* 7.3* 7.9* 8.2*  HCT 28.3* 27.2* 25.1* 26.7* 27.7*  PLT 277 240 229 311 274  MCV 90.1 90.1 91.3 89.0 89.6  MCH 26.4 26.2 26.5 26.3 26.5  MCHC 29.3* 29.0* 29.1* 29.6* 29.6*  RDW 16.4* 16.4* 16.6* 16.7* 17.9*  LYMPHSABS 1.5  --   --   --   --   MONOABS 1.4*  --   --   --   --   EOSABS 1.3*  --   --   --   --   BASOSABS 0.0  --   --   --   --     Complete metabolic panel:  Recent Labs  Lab 07/20/20 0559 07/21/20 0300 07/21/20 0605 07/22/20 0516 07/22/20 1240 07/22/20 2034 07/23/20 0412 07/23/20 0500 07/24/20 0345  NA 137   < > 133* 141 135  --  124* 140 139  K 4.7   < > 4.9 2.8* 5.4*  --   3.9 4.6 4.8  CL 97*   < > 92* 112* 92*  --  89* 96* 94*  CO2 36*   < > 37* 26 39*  --  25 39* 41*  GLUCOSE 164*   < > 101* 118* 161*  --  497* 115* 142*  BUN 21   < > 23 14 19   --  10 16 20   CREATININE 0.66   < > 0.70 0.32* 0.65  --  0.33* 0.65 0.61  CALCIUM 8.6*   < > 9.2 5.7* 9.4  --  7.1* 8.9 9.4  MG 1.9  --  1.8 1.1* 2.9* 2.5*  --   --   --   CRP  --   --  1.8*  --   --   --   --   --   --   PROCALCITON  --   --  0.40  --   --   --   --   --   --   BNP  --   --  141.5*  --   --   --   --   --   --    < > = values in this interval not displayed.    No results for input(s): LIPASE, AMYLASE in the last 168 hours.  Recent Labs  Lab 07/21/20 0605  CRP 1.8*  BNP 141.5*  PROCALCITON 0.40    ------------------------------------------------------------------------------------------------------------------ No results for input(s): CHOL, HDL, LDLCALC, TRIG, CHOLHDL, LDLDIRECT in the last 72 hours.  Lab Results  Component Value Date   HGBA1C 5.8 (H) 05/25/2020   ------------------------------------------------------------------------------------------------------------------ No results for input(s): TSH, T4TOTAL, T3FREE, THYROIDAB in the last 72 hours.  Invalid input(s): FREET3 ------------------------------------------------------------------------------------------------------------------ No results for input(s): VITAMINB12, FOLATE, FERRITIN, TIBC, IRON, RETICCTPCT in the last 72 hours.  Coagulation profile  No results for input(s): INR, PROTIME in the last 168 hours.  No results for input(s): DDIMER in the last 72 hours.  Cardiac Enzymes  No results for input(s): CKMB, TROPONINI, MYOGLOBIN in the last 168 hours.  Invalid input(s): CK ------------------------------------------------------------------------------------------------------------------    Component Value Date/Time   BNP 141.5 (H) 07/21/2020 1700     Antibiotics: Anti-infectives (From admission, onward)    Start     Dose/Rate Route Frequency Ordered Stop   07/21/20 1000  linezolid (ZYVOX) IVPB 600 mg  Status:  Discontinued        600 mg 300 mL/hr over 60 Minutes Intravenous  Every 12 hours 07/21/20 0532 07/23/20 1058   07/21/20 0800  levofloxacin (LEVAQUIN) IVPB 750 mg        750 mg 100 mL/hr over 90 Minutes Intravenous Every 24 hours 07/21/20 0525 07/23/20 0941       Radiology Reports  No results found.    DVT prophylaxis: Heparin  Code Status: Full code  Family Communication: No family at bedside   Consultants:    Procedures:      Objective    Physical Examination:    General: Appears in no acute distress  Cardiovascular: S1-S2, regular, no murmur auscultated  Respiratory: Decreased breath sounds bilaterally  Abdomen: Abdomen is soft, nontender, no organomegaly  Extremities: No edema in the lower extremities  Neurologic: Alert, oriented x3, intact insight and judgment   Status is: Inpatient  Dispo: The patient is from: Home              Anticipated d/c is to: Home              Anticipated d/c date is: 07/26/2020              Patient currently not stable for discharge  Barrier to discharge-ongoing management for acute on chronic hypercapnic and hypoxemic respiratory failure  COVID-19 Labs  No results for input(s): DDIMER, FERRITIN, LDH, CRP in the last 72 hours.  Lab Results  Component Value Date   [place] NEGATIVE 07/14/2020   [place] NEGATIVE 05/25/2020   [place] NEGATIVE 03/23/2020   SARSCOV2NAA NOT DETECTED 07/18/2019    Microbiology  Recent Results (from the past 240 hour(s))  Resp Panel by RT-PCR (Flu A&B, Covid) Nasopharyngeal Swab     Status: None   Collection Time: 07/14/20  2:58 PM   Specimen: Nasopharyngeal Swab; Nasopharyngeal(NP) swabs in vial transport medium  Result Value Ref Range Status   SARS Coronavirus 2 by RT PCR NEGATIVE NEGATIVE Final    Comment: (NOTE) SARS-CoV-2 target nucleic acids are NOT  DETECTED.  The SARS-CoV-2 RNA is generally detectable in upper respiratory specimens during the acute phase of infection. The lowest concentration of SARS-CoV-2 viral copies this assay can detect is 138 copies/mL. A negative result does not preclude SARS-Cov-2 infection and should not be used as the sole basis for treatment or other patient management decisions. A negative result may occur with  improper specimen collection/handling, submission of specimen other than nasopharyngeal swab, presence of viral mutation(s) within the areas targeted by this assay, and inadequate number of viral copies(<138 copies/mL). A negative result must be combined with clinical observations, patient history, and epidemiological information. The expected result is Negative.  Fact Sheet for Patients:  EntrepreneurPulse.com.au  Fact Sheet for Healthcare Providers:  IncredibleEmployment.be  This test is no t yet approved or cleared by the [place] FDA and  has been authorized for detection and/or diagnosis of SARS-CoV-2 by FDA under an Emergency Use Authorization (EUA). This EUA will remain  in effect (meaning this test can be used) for the duration of the COVID-19 declaration under Section 564(b)(1) of the Act, 21 U.S.C.section 360bbb-3(b)(1), unless the authorization is terminated  or revoked sooner.       Influenza A by PCR NEGATIVE NEGATIVE Final   Influenza B by PCR NEGATIVE NEGATIVE Final    Comment: (NOTE) The Xpert Xpress SARS-CoV-2/FLU/RSV plus assay is intended as an aid in the diagnosis of influenza from Nasopharyngeal swab specimens and should not be used as a sole basis for treatment. Nasal washings and aspirates are unacceptable for Xpert Xpress  SARS-CoV-2/FLU/RSV testing.  Fact Sheet for Patients: EntrepreneurPulse.com.au  Fact Sheet for Healthcare Providers: IncredibleEmployment.be  This test is not yet  approved or cleared by the [place] FDA and has been authorized for detection and/or diagnosis of SARS-CoV-2 by FDA under an Emergency Use Authorization (EUA). This EUA will remain in effect (meaning this test can be used) for the duration of the COVID-19 declaration under Section 564(b)(1) of the Act, 21 U.S.C. section 360bbb-3(b)(1), unless the authorization is terminated or revoked.  Performed at [facility], [place] [address] [place], [place] [address]   MRSA PCR Screening     Status: None   Collection Time: 07/14/20  6:29 PM   Specimen: Nasal Mucosa; Nasopharyngeal  Result Value Ref Range Status   MRSA by PCR NEGATIVE NEGATIVE Final    Comment:        The GeneXpert MRSA Assay (FDA approved for NASAL specimens only), is one component of a comprehensive MRSA colonization surveillance program. It is not intended to diagnose MRSA infection nor to guide or monitor treatment for MRSA infections. Performed at [facility], [place] [address], [place], [place] [address]   Culture, blood (routine x 2)     Status: None (Preliminary result)   Collection Time: 07/21/20  6:08 AM   Specimen: BLOOD  Result Value Ref Range Status   Specimen Description   Final    BLOOD LEFT ANTECUBITAL Performed at [place] [address], [place], [place] [address]    Special Requests   Final    BOTTLES DRAWN AEROBIC AND ANAEROBIC Blood Culture adequate volume Performed at [place] [address] [place], [place] [address]    Culture   Final    NO GROWTH 3 DAYS Performed at [facility], [place] [address] [place], [place] [address]    Report Status PENDING  Incomplete  Culture, blood (routine x 2)     Status: None (Preliminary result)   Collection Time: 07/21/20  6:13 AM   Specimen: BLOOD  Result Value Ref Range Status   Specimen Description   Final    BLOOD RIGHT ANTECUBITAL Performed at [place] [address], [place], [place] [address]    Special Requests   Final    BOTTLES DRAWN AEROBIC AND ANAEROBIC Blood Culture adequate volume Performed at [place] [address], [place], [place] [address]    Culture   Final    NO GROWTH 3 DAYS Performed at [facility], [place] [address], [place],  [address]    Report Status PENDING  Incomplete  Respiratory (~20 pathogens) panel by PCR     Status: None   Collection Time: 07/21/20  6:14 AM   Specimen: Nasopharyngeal Swab; Respiratory  Result Value Ref Range Status   Adenovirus NOT DETECTED NOT DETECTED Final   Coronavirus 229E NOT DETECTED NOT DETECTED Final    Comment: (NOTE) The Coronavirus on the Respiratory Panel, DOES NOT test for the novel  Coronavirus (2019 nCoV)    Coronavirus HKU1 NOT DETECTED NOT DETECTED Final   Coronavirus NL63 NOT DETECTED NOT DETECTED Final   Coronavirus OC43 NOT DETECTED NOT DETECTED Final   Metapneumovirus NOT DETECTED NOT DETECTED Final   Rhinovirus / Enterovirus NOT DETECTED NOT DETECTED Final   Influenza A NOT DETECTED NOT DETECTED Final   Influenza B NOT DETECTED NOT DETECTED Final   Parainfluenza Virus 1 NOT DETECTED NOT DETECTED Final   Parainfluenza  Virus 2 NOT DETECTED NOT DETECTED Final   Parainfluenza Virus 3 NOT DETECTED NOT DETECTED Final   Parainfluenza Virus 4 NOT DETECTED NOT DETECTED Final   Respiratory Syncytial Virus NOT DETECTED NOT DETECTED Final   Bordetella pertussis NOT DETECTED NOT DETECTED Final   Bordetella Parapertussis NOT DETECTED NOT DETECTED Final   Chlamydophila pneumoniae NOT DETECTED NOT DETECTED Final   Mycoplasma pneumoniae NOT DETECTED NOT DETECTED Final    Comment: Performed at [facility], [place] [address], [place], [place] [address]  Culture, Respiratory w Gram Stain     Status: None   Collection Time: 07/21/20  8:40 AM   Specimen: Tracheal Aspirate; Respiratory  Result Value Ref Range Status    Specimen Description   Final    TRACHEAL ASPIRATE Performed at [place] [address] [place], [place] [address]    Special Requests   Final    NONE Performed at [facility], [place] [address], [place], [place] [address]    Gram Stain   Final    FEW WBC PRESENT,BOTH PMN AND MONONUCLEAR NO ORGANISMS SEEN    Culture   Final    NO GROWTH Performed at [facility], [place] [address], [place], [place] [address]    Report Status 07/23/2020 FINAL  Final  MRSA PCR Screening     Status: None   Collection Time: 07/23/20  9:00 AM   Specimen: Nasopharyngeal  Result Value Ref Range Status   MRSA by PCR NEGATIVE NEGATIVE Final    Comment:        The GeneXpert MRSA Assay (FDA approved for NASAL specimens only), is one component of a comprehensive MRSA colonization surveillance program. It is not intended to diagnose MRSA infection nor to guide or monitor treatment for MRSA infections. Performed at [facility], [place] [address], [place], [place] [address]     Pressure Injury 07/14/20 Buttocks Medial Stage 2 -  Partial thickness loss of dermis presenting as a shallow open injury with a red, pink wound bed without slough. red, open skin (Active)  07/14/20 1700  Location: Buttocks  Location Orientation: Medial  Staging: Stage 2 -  Partial thickness loss of dermis presenting as a shallow open injury with a red, pink wound bed without slough.  Wound Description (Comments): red, open skin  Present on Admission: Yes          [place]   [facility] If 7PM-7AM, please contact night-coverage at [email or web address], Office  [phone]   07/24/2020, 12:54 PM  LOS: 3 days

## 2020-07-25 DIAGNOSIS — J9622 Acute and chronic respiratory failure with hypercapnia: Secondary | ICD-10-CM | POA: Diagnosis not present

## 2020-07-25 DIAGNOSIS — J9602 Acute respiratory failure with hypercapnia: Secondary | ICD-10-CM

## 2020-07-25 DIAGNOSIS — J9621 Acute and chronic respiratory failure with hypoxia: Secondary | ICD-10-CM | POA: Diagnosis not present

## 2020-07-25 LAB — GLUCOSE, CAPILLARY
Glucose-Capillary: 119 mg/dL — ABNORMAL HIGH (ref 70–99)
Glucose-Capillary: 129 mg/dL — ABNORMAL HIGH (ref 70–99)
Glucose-Capillary: 130 mg/dL — ABNORMAL HIGH (ref 70–99)
Glucose-Capillary: 132 mg/dL — ABNORMAL HIGH (ref 70–99)
Glucose-Capillary: 134 mg/dL — ABNORMAL HIGH (ref 70–99)
Glucose-Capillary: 141 mg/dL — ABNORMAL HIGH (ref 70–99)
Glucose-Capillary: 151 mg/dL — ABNORMAL HIGH (ref 70–99)

## 2020-07-25 NOTE — Progress Notes (Signed)
NAME:  Steve [name], MRN:  [id number], DOB:  January 29, 1956, LOS: 4 ADMISSION DATE:  07/21/2020, CONSULTATION DATE:  4/12 REFERRING MD:  [place], CHIEF COMPLAINT:  Dyspnea   History of Present Illness:  65 y/o M who has chronic trach after recent prolonged hospitalization in 2021 with bilateral infiltrates related to COVID c/b ARDS and pulmonary fibrosis, +/- pneumonitis from Brentuximab.    He presented to [facility] ED 07/20/20 with dyspnea and inability to maintain O2 sats above 70% despite suctioning. EMS placed him on 15L O2 via trach collar and sats improved to 90%.  In ED, he was in distress on ATC and trach was ultimately changed to 6 cuffed shiley in order to place him on the ventilator.  PCCM subsequently called for admission.  He was admitted to [facility] from 4/5 through 4/11 for HCAP.  He was treated with a course of vanc and cefepime and discharged on Augmentin.  He is followed by Oncology for Hodgkin Lymphoma and received Brentuximab vedotin on 07/10/20.   Pertinent  Medical History  Hodgkin's Lymphoma  OSA  COVID - prolonged on vent, ARDS 2021 with trach, pneumothorax s/p CT's GERD HTN DM   Significant Hospital Events: Including procedures, antibiotic start and stop dates in addition to other pertinent events    4/12 Admit with hypoxic resp failure, briefly placed on mechanical ventilation, sputum cultured, hypotensive but responded to IVF's.  ABG with largely compensated hypercarbic resp failure   4/13 On ATC 60%. Multiple electrolyte disturbances.   4/14 ATC 40%, 10L (baseline 8L) . 4/16 called back because of frequent need to change inner cannula on tracheostomy   Interim History / Subjective:  Called back due to frequent need to change inner cannula on tracheostomy  Says he feels fine  He remains on trach collar at 10Lpm  Objective   Blood pressure (!) 104/56, pulse 87, temperature 98.1 F (36.7 C), temperature source Oral, resp. rate (!) 40, height 5' 6.5" (1.689 m),  weight 62.1 kg, SpO2 100 %.    FiO2 (%):  [50 %] 50 %   Intake/Output Summary (Last 24 hours) at 07/25/2020 1351 Last data filed at 07/25/2020 0409 Gross per 24 hour  Intake 2942.84 ml  Output 1300 ml  Net 1642.84 ml   Filed Weights   07/23/20 0405 07/24/20 0357 07/25/20 0409  Weight: 62.6 kg 62.9 kg 62.1 kg    Examination: General:  Chronically ill appearing, resting comfortably in bed HENT: NCAT OP clear PULM: Few crackles lower lobes B, tachypnea but normal effort for him CV: RRR, no mgr GI: BS+, soft, nontender MSK: Diminished bulk and tone Neuro: awake, alert, no distress, MAEW   Labs/imaging that I havepersonally reviewed  (right click and "Reselect all SmartList Selections" daily)  4/12 CT chest > chronic fibrotic change predominantly focused in the periphery with increased ground glass opacifications and patchy consolidation throughout both lungs compared to the PET scan from March  Resolved Hospital Problem list     Assessment & Plan:  Acute on chronic respiratory failure with hypoxemia due to HCAP> Now recovered and back to baseline Baseline severe post COVID pulmonary fibrosis Changing tracheostomy inner cannula multiple times per day> at home he keeps 10 inner cannulae on hand at all times and changes them every 3-4 hours Based on my assessment 4/16 I don't feel that there is any new medical problem that needs to be addressed and his current management is appropriate I don't really understand why he feels the need to change  the inner cannula multiple times per day as it doesn't appear to be medically necessary> this almost seems like obsessive/compulsive type behavior Would continue trach collar as you are doing Monitor off antibiotics Likely change back to cuffless tracheostomy on 4/18 if remains stable  Physical deconditioning PT consult  Recurrent metastatic lymphoma Continue Brentuximab/management as per oncology  Rest per [facility]  Best practice (right  click and "Reselect all SmartList Selections" daily)   Per [facility]  Labs   CBC: Recent Labs  Lab 07/21/20 0300 07/21/20 0605 07/22/20 0516 07/23/20 0412 07/24/20 0345  WBC 15.9* 14.3* 11.7* 12.4* 10.9*  NEUTROABS 11.5*  --   --   --   --   HGB 8.3* 7.9* 7.3* 7.9* 8.2*  HCT 28.3* 27.2* 25.1* 26.7* 27.7*  MCV 90.1 90.1 91.3 89.0 89.6  PLT 277 240 229 311 262    Basic Metabolic Panel: Recent Labs  Lab 07/20/20 0559 07/21/20 0300 07/21/20 0605 07/22/20 0516 07/22/20 1240 07/22/20 2034 07/23/20 0412 07/23/20 0500 07/24/20 0345  NA 137   < > 133* 141 135  --  124* 140 139  K 4.7   < > 4.9 2.8* 5.4*  --  3.9 4.6 4.8  CL 97*   < > 92* 112* 92*  --  89* 96* 94*  CO2 36*   < > 37* 26 39*  --  25 39* 41*  GLUCOSE 164*   < > 101* 118* 161*  --  497* 115* 142*  BUN 21   < > 23 14 19   --  10 16 20   CREATININE 0.66   < > 0.70 0.32* 0.65  --  0.33* 0.65 0.61  CALCIUM 8.6*   < > 9.2 5.7* 9.4  --  7.1* 8.9 9.4  MG 1.9  --  1.8 1.1* 2.9* 2.5*  --   --   --   PHOS 2.4*  --  3.1  --   --   --   --   --   --    < > = values in this interval not displayed.   GFR: Estimated Creatinine Clearance: 81.9 mL/min (by C-G formula based on SCr of 0.61 mg/dL). Recent Labs  Lab 07/21/20 0605 07/22/20 0516 07/23/20 0412 07/24/20 0345  PROCALCITON 0.40  --   --   --   WBC 14.3* 11.7* 12.4* 10.9*    Liver Function Tests: No results for input(s): AST, ALT, ALKPHOS, BILITOT, PROT, ALBUMIN in the last 168 hours. No results for input(s): LIPASE, AMYLASE in the last 168 hours. No results for input(s): AMMONIA in the last 168 hours.  ABG    Component Value Date/Time   PHART 7.348 (L) 07/21/2020 0500   PCO2ART 74.2 (HH) 07/21/2020 0500   PO2ART 86.8 07/21/2020 0500   HCO3 39.7 (H) 07/21/2020 0500   TCO2 28 07/08/2019 1331   O2SAT 96.5 07/21/2020 0500     Coagulation Profile: No results for input(s): INR, PROTIME in the last 168 hours.  Cardiac Enzymes: No results for input(s): CKTOTAL,  CKMB, CKMBINDEX, TROPONINI in the last 168 hours.  HbA1C: Hgb A1c MFr Bld  Date/Time Value Ref Range Status  05/25/2020 05:42 PM 5.8 (H) 4.8 - 5.6 % Final    Comment:    (NOTE)         Prediabetes: 5.7 - 6.4         Diabetes: >6.4         Glycemic control for adults with diabetes: <7.0   03/23/2020 12:39  PM 5.7 (H) 4.8 - 5.6 % Final    Comment:    (NOTE) Pre diabetes:          5.7%-6.4%  Diabetes:              >6.4%  Glycemic control for   <7.0% adults with diabetes     CBG: Recent Labs  Lab 07/24/20 2000 07/25/20 0010 07/25/20 0315 07/25/20 0817 07/25/20 1157  GLUCAP 180* 132* 141* 134* 129*     Critical care time: n/a    [name], MD [facility] Pager: [phone] Cell: [phone] If no response, please call [phone] until 7pm After 7:00 pm call Elink  [phone]

## 2020-07-25 NOTE — Progress Notes (Signed)
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  Patient presented to ED on 07/20/2020 with complaints of dyspnea and inability to maintain O2 sats above 70% despite suctioning.  EMS placed him on 15 L oxygen via trach collar and sats improved to 90%.  Patient was in distress on trach collar and trach was ultimately changed to 6 cuffed [place] .  PCCM was consulted.  ABG showed compensated hypercarbic respiratory failure.  Patient was on trach collar 60% FiO2 which has been weaned down to 10 L 40% FiO2.  Patient baseline is 8 L/min.  [facility] assumed care on 07/24/2020.  Subjective    Patient seen and examined, denies any complaints.   Assessment/Plan:     1. Acute on chronic hypercapnic/hypoxemic respiratory failure-secondary to pulmonary fibrosis from COVID-19 and occupational lung disease, bronchiectasis.  He was empirically started on Zyvox and levofloxacin.  Antibiotics were discontinued as sputum culture showed no growth.  We will continue to wean off oxygen as tolerated.  Patient baseline oxygen is 8 L/min on trach collar. 2. Hypotension-patient was orthostatic on presentation, resolved after he was given IV fluid boluses on 07/21/2020.  At this time blood pressure is stable, continue to monitor. 3. Normocytic anemia-hemoglobin stable at 8.2.  Will transfuse for hemoglobin less than 7. 4. Hodgkin's lymphoma-patient followed by oncology   Scheduled medications:   . Chlorhexidine Gluconate Cloth  6 each Topical Daily  . feeding supplement (OSMOLITE 1.2 CAL)  1,000 mL Per Tube Q24H  . feeding supplement (PROSource TF)  45 mL Per Tube TID  . free water  100 mL Per Tube Q4H  .  heparin  5,000 Units Subcutaneous Q8H  . insulin aspart  0-9 Units Subcutaneous Q4H  . mouth rinse  15 mL Mouth Rinse BID  . multivitamin  15 mL Per Tube Daily  . pantoprazole sodium  40 mg Per Tube QHS  . sertraline  50 mg Per Tube Daily  . sodium chloride flush  10-40 mL Intracatheter Q12H  . traZODone  100 mg Per Tube QHS         Data Reviewed:   CBG:  Recent Labs  Lab 07/24/20 1606 07/24/20 2000 07/25/20 0010 07/25/20 0315 07/25/20 0817  GLUCAP 110* 180* 132* 141* 134*    SpO2: 100 % O2 Flow Rate (L/min): 10 L/min FiO2 (%): 50 %    Vitals:   07/25/20 0700 07/25/20 0800 07/25/20 0836 07/25/20 0900  BP: (!) 91/56 (!) 139/117  107/61  Pulse: 73 89  91  Resp: (!) 0 (!) 30  (!) 24  Temp:   97.6 F (36.4 C)   TempSrc:   Axillary   SpO2: 100% 99%  100%  Weight:      Height:         Intake/Output Summary (Last 24 hours) at 07/25/2020 1102 Last data filed at 07/25/2020 0409 Gross per 24 hour  Intake 2942.84 ml  Output 1675 ml  Net 1267.84 ml    04/14 1901 - 04/16 0700 In: 2942.8  Out: 2575 [Urine:2575]  Filed Weights   07/23/20 0405 07/24/20 0357 07/25/20 0409  Weight: 62.6 kg 62.9 kg 62.1 kg    CBC:  Recent Labs  Lab 07/21/20 0300 07/21/20 0605 07/22/20 0516 07/23/20 4650  07/24/20 0345  WBC 15.9* 14.3* 11.7* 12.4* 10.9*  HGB 8.3* 7.9* 7.3* 7.9* 8.2*  HCT 28.3* 27.2* 25.1* 26.7* 27.7*  PLT 277 240 229 311 274  MCV 90.1 90.1 91.3 89.0 89.6  MCH 26.4 26.2 26.5 26.3 26.5  MCHC 29.3* 29.0* 29.1* 29.6* 29.6*  RDW 16.4* 16.4* 16.6* 16.7* 17.9*  LYMPHSABS 1.5  --   --   --   --   MONOABS 1.4*  --   --   --   --   EOSABS 1.3*  --   --   --   --   BASOSABS 0.0  --   --   --   --     Complete metabolic panel:  Recent Labs  Lab 07/20/20 0559 07/21/20 0300 07/21/20 0605 07/22/20 0516 07/22/20 1240 07/22/20 2034 07/23/20 0412 07/23/20 0500 07/24/20 0345  NA 137   < > 133* 141 135  --  124* 140 139  K 4.7   < > 4.9 2.8* 5.4*  --  3.9 4.6  4.8  CL 97*   < > 92* 112* 92*  --  89* 96* 94*  CO2 36*   < > 37* 26 39*  --  25 39* 41*  GLUCOSE 164*   < > 101* 118* 161*  --  497* 115* 142*  BUN 21   < > 23 14 19   --  10 16 20   CREATININE 0.66   < > 0.70 0.32* 0.65  --  0.33* 0.65 0.61  CALCIUM 8.6*   < > 9.2 5.7* 9.4  --  7.1* 8.9 9.4  MG 1.9  --  1.8 1.1* 2.9* 2.5*  --   --   --   CRP  --   --  1.8*  --   --   --   --   --   --   PROCALCITON  --   --  0.40  --   --   --   --   --   --   BNP  --   --  141.5*  --   --   --   --   --   --    < > = values in this interval not displayed.    No results for input(s): LIPASE, AMYLASE in the last 168 hours.  Recent Labs  Lab 07/21/20 0605  CRP 1.8*  BNP 141.5*  PROCALCITON 0.40    ------------------------------------------------------------------------------------------------------------------ No results for input(s): CHOL, HDL, LDLCALC, TRIG, CHOLHDL, LDLDIRECT in the last 72 hours.  Lab Results  Component Value Date   HGBA1C 5.8 (H) 05/25/2020   ------------------------------------------------------------------------------------------------------------------ No results for input(s): TSH, T4TOTAL, T3FREE, THYROIDAB in the last 72 hours.  Invalid input(s): FREET3 ------------------------------------------------------------------------------------------------------------------ No results for input(s): VITAMINB12, FOLATE, FERRITIN, TIBC, IRON, RETICCTPCT in the last 72 hours.  Coagulation profile  No results for input(s): INR, PROTIME in the last 168 hours.  No results for input(s): DDIMER in the last 72 hours.  Cardiac Enzymes  No results for input(s): CKMB, TROPONINI, MYOGLOBIN in the last 168 hours.  Invalid input(s): CK ------------------------------------------------------------------------------------------------------------------    Component Value Date/Time   BNP 141.5 (H) 07/21/2020 2376     Antibiotics: Anti-infectives (From admission, onward)   Start      Dose/Rate Route Frequency Ordered Stop   07/21/20 1000  linezolid (ZYVOX) IVPB 600 mg  Status:  Discontinued        600 mg 300 mL/hr over 60 Minutes Intravenous Every 12 hours 07/21/20  0532 07/23/20 1058   07/21/20 0800  levofloxacin (LEVAQUIN) IVPB 750 mg        750 mg 100 mL/hr over 90 Minutes Intravenous Every 24 hours 07/21/20 0525 07/23/20 0941       Radiology Reports  No results found.    DVT prophylaxis: Heparin  Code Status: Full code  Family Communication: No family at bedside   Consultants:    Procedures:      Objective    Physical Examination:    General-appears in no acute distress  Heart-S1-S2, regular, no murmur auscultated  Lungs-clear to auscultation bilaterally, no wheezing or crackles auscultated  Abdomen-soft, nontender, no organomegaly  Extremities-no edema in the lower extremities  Neuro-alert, oriented x3, no focal deficit noted   Status is: Inpatient  Dispo: The patient is from: Home              Anticipated d/c is to: Home              Anticipated d/c date is: 07/27/2020              Patient currently not stable for discharge  Barrier to discharge-ongoing management for acute on chronic hypercapnic and hypoxemic respiratory failure  COVID-19 Labs  No results for input(s): DDIMER, FERRITIN, LDH, CRP in the last 72 hours.  Lab Results  Component Value Date   [place] NEGATIVE 07/14/2020   [place] NEGATIVE 05/25/2020   [place] NEGATIVE 03/23/2020   [place] NOT DETECTED 07/18/2019    Microbiology  Recent Results (from the past 240 hour(s))  Culture, blood (routine x 2)     Status: None (Preliminary result)   Collection Time: 07/21/20  6:08 AM   Specimen: BLOOD  Result Value Ref Range Status   Specimen Description   Final    BLOOD LEFT ANTECUBITAL Performed at [facility] [address], [place], [place] [address]    Special Requests   Final    BOTTLES DRAWN AEROBIC AND  ANAEROBIC Blood Culture adequate volume Performed at [facility] [address], [place], [place] [address]    Culture   Final    NO GROWTH 3 DAYS Performed at [facility], [place] [address] [place], [place] [address]    Report Status PENDING  Incomplete  Culture, blood (routine x 2)     Status: None (Preliminary result)   Collection Time: 07/21/20  6:13 AM   Specimen: BLOOD  Result Value Ref Range Status   Specimen Description   Final    BLOOD RIGHT ANTECUBITAL Performed at [facility] [address] [place], [place] [address]    Special Requests   Final    BOTTLES DRAWN AEROBIC AND ANAEROBIC Blood Culture adequate volume Performed at [facility] [address] [place], [name] [address]    Culture   Final    NO GROWTH 3 DAYS Performed at [facility], [place] [address], [place], [place] [address]    Report Status PENDING  Incomplete  Respiratory (~20 pathogens) panel by PCR     Status: None   Collection Time: 07/21/20  6:14 AM   Specimen: Nasopharyngeal Swab; Respiratory  Result Value Ref Range Status   Adenovirus NOT DETECTED NOT DETECTED Final   Coronavirus 229E NOT DETECTED NOT DETECTED Final    Comment: (NOTE) The Coronavirus on the Respiratory Panel, DOES NOT test for the novel  Coronavirus (2019 nCoV)    Coronavirus HKU1 NOT DETECTED NOT DETECTED Final  Coronavirus NL63 NOT DETECTED NOT DETECTED Final   Coronavirus OC43 NOT DETECTED NOT DETECTED Final   Metapneumovirus NOT DETECTED NOT DETECTED Final   Rhinovirus / Enterovirus NOT DETECTED NOT DETECTED Final   Influenza A NOT DETECTED NOT DETECTED Final   Influenza B NOT DETECTED NOT DETECTED Final   Parainfluenza Virus 1 NOT DETECTED NOT DETECTED Final   Parainfluenza Virus 2 NOT DETECTED NOT DETECTED Final   Parainfluenza Virus 3 NOT DETECTED NOT DETECTED Final   Parainfluenza Virus 4 NOT DETECTED NOT DETECTED Final   Respiratory  Syncytial Virus NOT DETECTED NOT DETECTED Final   Bordetella pertussis NOT DETECTED NOT DETECTED Final   Bordetella Parapertussis NOT DETECTED NOT DETECTED Final   Chlamydophila pneumoniae NOT DETECTED NOT DETECTED Final   Mycoplasma pneumoniae NOT DETECTED NOT DETECTED Final    Comment: Performed at [facility], [place] [address] [place], [place] [address]  Culture, Respiratory w Gram Stain     Status: None   Collection Time: 07/21/20  8:40 AM   Specimen: Tracheal Aspirate; Respiratory  Result Value Ref Range Status   Specimen Description   Final    TRACHEAL ASPIRATE Performed at [facility] [address] [place], [place] [address]    Special Requests   Final    NONE Performed at [facility], [place] [address], [place], [place] [address]    Gram Stain   Final    FEW WBC PRESENT,BOTH PMN AND MONONUCLEAR NO ORGANISMS SEEN    Culture   Final    NO GROWTH Performed at [facility], [place] [address], [place], [place] [address]    Report Status 07/23/2020 FINAL  Final  MRSA PCR Screening     Status: None   Collection Time: 07/23/20  9:00 AM   Specimen: Nasopharyngeal  Result Value Ref Range Status   MRSA by PCR NEGATIVE NEGATIVE Final    Comment:        The GeneXpert MRSA Assay (FDA approved for NASAL specimens only), is one component of a comprehensive MRSA colonization surveillance program. It is not intended to diagnose MRSA infection nor to guide or monitor treatment for MRSA infections. Performed at [facility], [place] [address] [place], [place] [address]     Pressure Injury 07/14/20 Buttocks Medial Stage 2 -  Partial thickness loss of dermis presenting as a shallow open injury with a red, pink wound bed without slough. red, open skin (Active)  07/14/20 1700  Location: Buttocks  Location Orientation: Medial  Staging: Stage 2 -  Partial thickness loss of dermis presenting as a shallow open injury with  a red, pink wound bed without slough.  Wound Description (Comments): red, open skin  Present on Admission: Yes          [place]   [facility] If 7PM-7AM, please contact night-coverage at [email or web address], Office  [phone]   07/25/2020, 11:02 AM  LOS: 4 days

## 2020-07-26 LAB — CULTURE, BLOOD (ROUTINE X 2)
Culture: NO GROWTH
Culture: NO GROWTH
Special Requests: ADEQUATE
Special Requests: ADEQUATE

## 2020-07-26 LAB — GLUCOSE, CAPILLARY
Glucose-Capillary: 134 mg/dL — ABNORMAL HIGH (ref 70–99)
Glucose-Capillary: 104 mg/dL — ABNORMAL HIGH (ref 70–99)
Glucose-Capillary: 137 mg/dL — ABNORMAL HIGH (ref 70–99)
Glucose-Capillary: 113 mg/dL — ABNORMAL HIGH (ref 70–99)
Glucose-Capillary: 154 mg/dL — ABNORMAL HIGH (ref 70–99)
Glucose-Capillary: 99 mg/dL (ref 70–99)

## 2020-07-26 NOTE — Progress Notes (Signed)
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  Patient presented to ED on 07/20/2020 with complaints of dyspnea and inability to maintain O2 sats above 70% despite suctioning.  EMS placed him on 15 L oxygen via trach collar and sats improved to 90%.  Patient was in distress on trach collar and trach was ultimately changed to 6 cuffed Shiley .  PCCM was consulted.  ABG showed compensated hypercarbic respiratory failure.  Patient was on trach collar 60% FiO2 which has been weaned down to 10 L 40% FiO2.  Patient baseline is 8 L/min.  [facility] assumed care on 07/24/2020.  Subjective    Patient seen and examined, denies shortness of breath.   Assessment/Plan:     1. Acute on chronic hypercapnic/hypoxemic respiratory failure-secondary to pulmonary fibrosis from COVID-19 and occupational lung disease, bronchiectasis.  He was empirically started on Zyvox and levofloxacin.  Antibiotics were discontinued as sputum culture showed no growth.  We will continue to wean off oxygen as tolerated.  Patient baseline oxygen is 8 L/min on trach collar. 2. Hypotension-patient was orthostatic on presentation, resolved after he was given IV fluid boluses on 07/21/2020.  At this time blood pressure is stable, continue to monitor. 3. Normocytic anemia-hemoglobin stable at 8.2.  Will transfuse for hemoglobin less than 7. 4. Hodgkin's lymphoma-patient followed by oncology   Scheduled medications:   . Chlorhexidine Gluconate Cloth  6 each Topical Daily  . feeding supplement (OSMOLITE 1.2 CAL)  1,000 mL Per Tube Q24H  . feeding supplement (PROSource TF)  45 mL Per Tube TID  . free water  100 mL Per Tube Q4H  .  heparin  5,000 Units Subcutaneous Q8H  . insulin aspart  0-9 Units Subcutaneous Q4H  . mouth rinse  15 mL Mouth Rinse BID  . multivitamin  15 mL Per Tube Daily  . pantoprazole sodium  40 mg Per Tube QHS  . sertraline  50 mg Per Tube Daily  . sodium chloride flush  10-40 mL Intracatheter Q12H  . traZODone  100 mg Per Tube QHS         Data Reviewed:   CBG:  Recent Labs  Lab 07/25/20 2004 07/25/20 2358 07/26/20 0329 07/26/20 0733 07/26/20 1153  GLUCAP 130* 119* 134* 104* 137*    SpO2: 100 % O2 Flow Rate (L/min): 10 L/min FiO2 (%): 40 %    Vitals:   07/26/20 0829 07/26/20 0834 07/26/20 1100 07/26/20 1235  BP:   (!) 89/60   Pulse: 82  79   Resp: (!) 32  (!) 30   Temp:  98.3 F (36.8 C)  98.2 F (36.8 C)  TempSrc:  Oral  Oral  SpO2: 96%  100%   Weight:      Height:         Intake/Output Summary (Last 24 hours) at 07/26/2020 1243 Last data filed at 07/26/2020 1000 Gross per 24 hour  Intake 600 ml  Output 1700 ml  Net -1100 ml    04/15 1901 - 04/17 0700 In: 1331.9  Out: 2750 [Urine:2750]  Filed Weights   07/24/20 0357 07/25/20 0409 07/26/20 0346  Weight: 62.9 kg 62.1 kg 62.3 kg    CBC:  Recent Labs  Lab 07/21/20 0300 07/21/20 0605 07/22/20 0516  07/23/20 0412 07/24/20 0345  WBC 15.9* 14.3* 11.7* 12.4* 10.9*  HGB 8.3* 7.9* 7.3* 7.9* 8.2*  HCT 28.3* 27.2* 25.1* 26.7* 27.7*  PLT 277 240 229 311 274  MCV 90.1 90.1 91.3 89.0 89.6  MCH 26.4 26.2 26.5 26.3 26.5  MCHC 29.3* 29.0* 29.1* 29.6* 29.6*  RDW 16.4* 16.4* 16.6* 16.7* 17.9*  LYMPHSABS 1.5  --   --   --   --   MONOABS 1.4*  --   --   --   --   EOSABS 1.3*  --   --   --   --   BASOSABS 0.0  --   --   --   --     Complete metabolic panel:  Recent Labs  Lab 07/20/20 0559 07/21/20 0300 07/21/20 0605 07/22/20 0516 07/22/20 1240 07/22/20 2034 07/23/20 0412 07/23/20 0500 07/24/20 0345  NA 137   < > 133* 141 135  --  124* 140 139  K 4.7   < > 4.9 2.8* 5.4*  --  3.9 4.6 4.8  CL 97*   <  > 92* 112* 92*  --  89* 96* 94*  CO2 36*   < > 37* 26 39*  --  25 39* 41*  GLUCOSE 164*   < > 101* 118* 161*  --  497* 115* 142*  BUN 21   < > 23 14 19   --  10 16 20   CREATININE 0.66   < > 0.70 0.32* 0.65  --  0.33* 0.65 0.61  CALCIUM 8.6*   < > 9.2 5.7* 9.4  --  7.1* 8.9 9.4  MG 1.9  --  1.8 1.1* 2.9* 2.5*  --   --   --   CRP  --   --  1.8*  --   --   --   --   --   --   PROCALCITON  --   --  0.40  --   --   --   --   --   --   BNP  --   --  141.5*  --   --   --   --   --   --    < > = values in this interval not displayed.    No results for input(s): LIPASE, AMYLASE in the last 168 hours.  Recent Labs  Lab 07/21/20 0605  CRP 1.8*  BNP 141.5*  PROCALCITON 0.40    ------------------------------------------------------------------------------------------------------------------ No results for input(s): CHOL, HDL, LDLCALC, TRIG, CHOLHDL, LDLDIRECT in the last 72 hours.  Lab Results  Component Value Date   HGBA1C 5.8 (H) 05/25/2020   ------------------------------------------------------------------------------------------------------------------ No results for input(s): TSH, T4TOTAL, T3FREE, THYROIDAB in the last 72 hours.  Invalid input(s): FREET3 ------------------------------------------------------------------------------------------------------------------ No results for input(s): VITAMINB12, FOLATE, FERRITIN, TIBC, IRON, RETICCTPCT in the last 72 hours.  Coagulation profile  No results for input(s): INR, PROTIME in the last 168 hours.  No results for input(s): DDIMER in the last 72 hours.  Cardiac Enzymes  No results for input(s): CKMB, TROPONINI, MYOGLOBIN in the last 168 hours.  Invalid input(s): CK ------------------------------------------------------------------------------------------------------------------    Component Value Date/Time   BNP 141.5 (H) 07/21/2020 6734     Antibiotics: Anti-infectives (From admission, onward)   Start     Dose/Rate  Route Frequency Ordered Stop   07/21/20 1000  linezolid (ZYVOX) IVPB 600 mg  Status:  Discontinued        600 mg 300 mL/hr over 60 Minutes Intravenous Every 12  hours 07/21/20 0532 07/23/20 1058   07/21/20 0800  levofloxacin (LEVAQUIN) IVPB 750 mg        750 mg 100 mL/hr over 90 Minutes Intravenous Every 24 hours 07/21/20 0525 07/23/20 0941       Radiology Reports  No results found.    DVT prophylaxis: Heparin  Code Status: Full code  Family Communication: No family at bedside   Consultants:    Procedures:      Objective    Physical Examination:    General-appears in no acute distress  Heart-S1-S2, regular, no murmur auscultated  Lungs-clear to auscultation bilaterally, no wheezing or crackles auscultated  Abdomen-soft, nontender, no organomegaly  Extremities-no edema in the lower extremities  Neuro-alert, oriented x3, no focal deficit noted   Status is: Inpatient  Dispo: The patient is from: Home              Anticipated d/c is to: Home              Anticipated d/c date is: 07/27/2020              Patient currently not stable for discharge  Barrier to discharge-ongoing management for acute on chronic hypercapnic and hypoxemic respiratory failure  COVID-19 Labs  No results for input(s): DDIMER, FERRITIN, LDH, CRP in the last 72 hours.  Lab Results  Component Value Date   [place] NEGATIVE 07/14/2020   [place] NEGATIVE 05/25/2020   [place] NEGATIVE 03/23/2020   [place] NOT DETECTED 07/18/2019    Microbiology  Recent Results (from the past 240 hour(s))  Culture, blood (routine x 2)     Status: None (Preliminary result)   Collection Time: 07/21/20  6:08 AM   Specimen: BLOOD  Result Value Ref Range Status   Specimen Description   Final    BLOOD LEFT ANTECUBITAL Performed at [facility] [address], [place], [place] [address]    Special Requests   Final    BOTTLES DRAWN AEROBIC AND ANAEROBIC Blood  Culture adequate volume Performed at [facility] [address], [place], [place] [address]    Culture   Final    NO GROWTH 4 DAYS Performed at [facility], [place] [address], [place], [place] [address]    Report Status PENDING  Incomplete  Culture, blood (routine x 2)     Status: None (Preliminary result)   Collection Time: 07/21/20  6:13 AM   Specimen: BLOOD  Result Value Ref Range Status   Specimen Description   Final    BLOOD RIGHT ANTECUBITAL Performed at [facility] [address] [place], [place] [address]    Special Requests   Final    BOTTLES DRAWN AEROBIC AND ANAEROBIC Blood Culture adequate volume Performed at [facility] [address], [place], [place] [address]    Culture   Final    NO GROWTH 4 DAYS Performed at [facility], [place] [address] [place],  [address]    Report Status PENDING  Incomplete  Respiratory (~20 pathogens) panel by PCR     Status: None   Collection Time: 07/21/20  6:14 AM   Specimen: Nasopharyngeal Swab; Respiratory  Result Value Ref Range Status   Adenovirus NOT DETECTED NOT DETECTED Final   Coronavirus 229E NOT DETECTED NOT DETECTED Final    Comment: (NOTE) The Coronavirus on the Respiratory Panel, DOES NOT test for the novel  Coronavirus (2019 nCoV)    Coronavirus HKU1 NOT DETECTED NOT DETECTED  Final   Coronavirus NL63 NOT DETECTED NOT DETECTED Final   Coronavirus OC43 NOT DETECTED NOT DETECTED Final   Metapneumovirus NOT DETECTED NOT DETECTED Final   Rhinovirus / Enterovirus NOT DETECTED NOT DETECTED Final   Influenza A NOT DETECTED NOT DETECTED Final   Influenza B NOT DETECTED NOT DETECTED Final   Parainfluenza Virus 1 NOT DETECTED NOT DETECTED Final   Parainfluenza Virus 2 NOT DETECTED NOT DETECTED Final   Parainfluenza Virus 3 NOT DETECTED NOT DETECTED Final   Parainfluenza Virus 4 NOT DETECTED NOT DETECTED Final   Respiratory Syncytial Virus NOT  DETECTED NOT DETECTED Final   Bordetella pertussis NOT DETECTED NOT DETECTED Final   Bordetella Parapertussis NOT DETECTED NOT DETECTED Final   Chlamydophila pneumoniae NOT DETECTED NOT DETECTED Final   Mycoplasma pneumoniae NOT DETECTED NOT DETECTED Final    Comment: Performed at [facility], [place] [address] [place], [place] [address]  Culture, Respiratory w Gram Stain     Status: None   Collection Time: 07/21/20  8:40 AM   Specimen: Tracheal Aspirate; Respiratory  Result Value Ref Range Status   Specimen Description   Final    TRACHEAL ASPIRATE Performed at [facility] [address], [place], [place] [address]    Special Requests   Final    NONE Performed at [facility], [place] [address], [place], [place] [address]    Gram Stain   Final    FEW WBC PRESENT,BOTH PMN AND MONONUCLEAR NO ORGANISMS SEEN    Culture   Final    NO GROWTH Performed at [facility], [place] [address] [place], [place] [address]    Report Status 07/23/2020 FINAL  Final  MRSA PCR Screening     Status: None   Collection Time: 07/23/20  9:00 AM   Specimen: Nasopharyngeal  Result Value Ref Range Status   MRSA by PCR NEGATIVE NEGATIVE Final    Comment:        The GeneXpert MRSA Assay (FDA approved for NASAL specimens only), is one component of a comprehensive MRSA colonization surveillance program. It is not intended to diagnose MRSA infection nor to guide or monitor treatment for MRSA infections. Performed at [facility], [place] [address], [place], [place] [address]     Pressure Injury 07/14/20 Buttocks Medial Stage 2 -  Partial thickness loss of dermis presenting as a shallow open injury with a red, pink wound bed without slough. red, open skin (Active)  07/14/20 1700  Location: Buttocks  Location Orientation: Medial  Staging: Stage 2 -  Partial thickness loss of dermis presenting as a shallow open injury with a red, pink wound  bed without slough.  Wound Description (Comments): red, open skin  Present on Admission: Yes          [place]   [facility] If 7PM-7AM, please contact night-coverage at [email or web address], Office  [phone]   07/26/2020, 12:43 PM  LOS: 5 days

## 2020-07-27 DIAGNOSIS — J9621 Acute and chronic respiratory failure with hypoxia: Secondary | ICD-10-CM | POA: Diagnosis not present

## 2020-07-27 DIAGNOSIS — J9622 Acute and chronic respiratory failure with hypercapnia: Secondary | ICD-10-CM | POA: Diagnosis not present

## 2020-07-27 LAB — FUNGITELL, SERUM: Fungitell Result: 35 pg/mL (ref <80)

## 2020-07-27 LAB — GLUCOSE, CAPILLARY
Glucose-Capillary: 126 mg/dL — ABNORMAL HIGH (ref 70–99)
Glucose-Capillary: 160 mg/dL — ABNORMAL HIGH (ref 70–99)
Glucose-Capillary: 135 mg/dL — ABNORMAL HIGH (ref 70–99)
Glucose-Capillary: 129 mg/dL — ABNORMAL HIGH (ref 70–99)
Glucose-Capillary: 117 mg/dL — ABNORMAL HIGH (ref 70–99)
Glucose-Capillary: 150 mg/dL — ABNORMAL HIGH (ref 70–99)

## 2020-07-27 NOTE — Progress Notes (Signed)
Nowhere to document trach check on WL (vitals on flowsheet)  PT sleeping at this time. [name] appears to be secure and dry, all emergency equipment in room at this time. PT now has an uncuffed #6 SH Flexi (changed earlier this shift).

## 2020-07-27 NOTE — Progress Notes (Signed)
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  Patient presented to ED on 07/20/2020 with complaints of dyspnea and inability to maintain O2 sats above 70% despite suctioning.  EMS placed him on 15 L oxygen via trach collar and sats improved to 90%.  Patient was in distress on trach collar and trach was ultimately changed to 6 cuffed Shiley .  PCCM was consulted.  ABG showed compensated hypercarbic respiratory failure.  Patient was on trach collar 60% FiO2 which has been weaned down to 10 L 40% FiO2.  Patient baseline is 8 L/min.  [facility] assumed care on 07/24/2020.  Subjective    Patient seen and examined, trach was changed to cuffless tracheostomy today.   Assessment/Plan:     1. Acute on chronic hypercapnic/hypoxemic respiratory failure-secondary to pulmonary fibrosis from COVID-19 and occupational lung disease, bronchiectasis.  He was empirically started on Zyvox and levofloxacin.  Antibiotics were discontinued as sputum culture showed no growth.  We will continue to wean off oxygen as tolerated.  Patient baseline oxygen is 8 L/min on trach collar. 2. Hypotension-patient was orthostatic on presentation, resolved after he was given IV fluid boluses on 07/21/2020.  At this time blood pressure is stable, continue to monitor. 3. Normocytic anemia-hemoglobin stable at 8.2.  Will transfuse for hemoglobin less than 7. 4. Hodgkin's lymphoma-patient followed by oncology  Will monitor patient has trach was changed today.  If stable, likely discharge home in a.m.  Scheduled medications:   . Chlorhexidine Gluconate Cloth  6 each Topical Daily  . feeding supplement (OSMOLITE 1.2 CAL)  1,000  mL Per Tube Q24H  . feeding supplement (PROSource TF)  45 mL Per Tube TID  . free water  100 mL Per Tube Q4H  . heparin  5,000 Units Subcutaneous Q8H  . insulin aspart  0-9 Units Subcutaneous Q4H  . mouth rinse  15 mL Mouth Rinse BID  . multivitamin  15 mL Per Tube Daily  . pantoprazole sodium  40 mg Per Tube QHS  . sertraline  50 mg Per Tube Daily  . sodium chloride flush  10-40 mL Intracatheter Q12H  . traZODone  100 mg Per Tube QHS         Data Reviewed:   CBG:  Recent Labs  Lab 07/26/20 1931 07/26/20 2316 07/27/20 0328 07/27/20 0809 07/27/20 1152  GLUCAP 154* 99 126* 160* 135*    SpO2: 97 % O2 Flow Rate (L/min): 8 L/min FiO2 (%): 35 %    Vitals:   07/27/20 1000 07/27/20 1100 07/27/20 1127 07/27/20 1200  BP: (!) 91/59 96/62 96/62  (!) 95/57  Pulse: 82 81 76 78  Resp: (!) 28 (!) 35 13 (!) 29  Temp:    97.9 F (36.6 C)  TempSrc:    Oral  SpO2: 98% 95% 97% 97%  Weight:      Height:         Intake/Output Summary (Last 24 hours) at 07/27/2020 1332 Last data filed at 07/27/2020 1015 Gross per 24 hour  Intake 945 ml  Output 1600 ml  Net -655 ml    04/16 1901 - 04/18 0700 In: 6578 [P.O.:945] Out: 2100 [Urine:2100]  Filed Weights   07/25/20 0409 07/26/20 0346 07/27/20 0404  Weight: 62.1 kg 62.3  kg 61.3 kg    CBC:  Recent Labs  Lab 07/21/20 0300 07/21/20 0605 07/22/20 0516 07/23/20 0412 07/24/20 0345  WBC 15.9* 14.3* 11.7* 12.4* 10.9*  HGB 8.3* 7.9* 7.3* 7.9* 8.2*  HCT 28.3* 27.2* 25.1* 26.7* 27.7*  PLT 277 240 229 311 274  MCV 90.1 90.1 91.3 89.0 89.6  MCH 26.4 26.2 26.5 26.3 26.5  MCHC 29.3* 29.0* 29.1* 29.6* 29.6*  RDW 16.4* 16.4* 16.6* 16.7* 17.9*  LYMPHSABS 1.5  --   --   --   --   MONOABS 1.4*  --   --   --   --   EOSABS 1.3*  --   --   --   --   BASOSABS 0.0  --   --   --   --     Complete metabolic panel:  Recent Labs  Lab 07/21/20 0605 07/22/20 0516 07/22/20 1240 07/22/20 2034 07/23/20 0412 07/23/20 0500 07/24/20 0345   NA 133* 141 135  --  124* 140 139  K 4.9 2.8* 5.4*  --  3.9 4.6 4.8  CL 92* 112* 92*  --  89* 96* 94*  CO2 37* 26 39*  --  25 39* 41*  GLUCOSE 101* 118* 161*  --  497* 115* 142*  BUN 23 14 19   --  10 16 20   CREATININE 0.70 0.32* 0.65  --  0.33* 0.65 0.61  CALCIUM 9.2 5.7* 9.4  --  7.1* 8.9 9.4  MG 1.8 1.1* 2.9* 2.5*  --   --   --   CRP 1.8*  --   --   --   --   --   --   PROCALCITON 0.40  --   --   --   --   --   --   BNP 141.5*  --   --   --   --   --   --     No results for input(s): LIPASE, AMYLASE in the last 168 hours.  Recent Labs  Lab 07/21/20 0605  CRP 1.8*  BNP 141.5*  PROCALCITON 0.40    ------------------------------------------------------------------------------------------------------------------ No results for input(s): CHOL, HDL, LDLCALC, TRIG, CHOLHDL, LDLDIRECT in the last 72 hours.  Lab Results  Component Value Date   HGBA1C 5.8 (H) 05/25/2020   ------------------------------------------------------------------------------------------------------------------ No results for input(s): TSH, T4TOTAL, T3FREE, THYROIDAB in the last 72 hours.  Invalid input(s): FREET3 ------------------------------------------------------------------------------------------------------------------ No results for input(s): VITAMINB12, FOLATE, FERRITIN, TIBC, IRON, RETICCTPCT in the last 72 hours.  Coagulation profile  No results for input(s): INR, PROTIME in the last 168 hours.  No results for input(s): DDIMER in the last 72 hours.  Cardiac Enzymes  No results for input(s): CKMB, TROPONINI, MYOGLOBIN in the last 168 hours.  Invalid input(s): CK ------------------------------------------------------------------------------------------------------------------    Component Value Date/Time   BNP 141.5 (H) 07/21/2020 2585     Antibiotics: Anti-infectives (From admission, onward)   Start     Dose/Rate Route Frequency Ordered Stop   07/21/20 1000  linezolid (ZYVOX) IVPB  600 mg  Status:  Discontinued        600 mg 300 mL/hr over 60 Minutes Intravenous Every 12 hours 07/21/20 0532 07/23/20 1058   07/21/20 0800  levofloxacin (LEVAQUIN) IVPB 750 mg        750 mg 100 mL/hr over 90 Minutes Intravenous Every 24 hours 07/21/20 0525 07/23/20 0941       Radiology Reports  No results found.    DVT prophylaxis: Heparin  Code Status: Full  code  Family Communication: No family at bedside   Consultants:    Procedures:      Objective    Physical Examination:    General-appears in no acute distress  Heart-S1-S2, regular, no murmur auscultated  Lungs-clear to auscultation bilaterally, no wheezing or crackles auscultated  Abdomen-soft, nontender, no organomegaly  Extremities-no edema in the lower extremities  Neuro-alert, oriented x3, no focal deficit noted   Status is: Inpatient  Dispo: The patient is from: Home              Anticipated d/c is to: Home              Anticipated d/c date is: 07/28/2020              Patient currently not stable for discharge  Barrier to discharge-ongoing management for acute on chronic hypercapnic and hypoxemic respiratory failure  COVID-19 Labs  No results for input(s): DDIMER, FERRITIN, LDH, CRP in the last 72 hours.  Lab Results  Component Value Date   [place] NEGATIVE 07/14/2020   [place] NEGATIVE 05/25/2020   [place] NEGATIVE 03/23/2020   [place] NOT DETECTED 07/18/2019    Microbiology  Recent Results (from the past 240 hour(s))  Culture, blood (routine x 2)     Status: None   Collection Time: 07/21/20  6:08 AM   Specimen: BLOOD  Result Value Ref Range Status   Specimen Description   Final    BLOOD LEFT ANTECUBITAL Performed at [facility] [address] [place], [place] [address]    Special Requests   Final    BOTTLES DRAWN AEROBIC AND ANAEROBIC Blood Culture adequate volume Performed at [facility] [address]  [place], [place] [address]    Culture   Final    NO GROWTH 5 DAYS Performed at [facility], [place] [address], [place], [place] [address]    Report Status 07/26/2020 FINAL  Final  Culture, blood (routine x 2)     Status: None   Collection Time: 07/21/20  6:13 AM   Specimen: BLOOD  Result Value Ref Range Status   Specimen Description   Final    BLOOD RIGHT ANTECUBITAL Performed at [facility] [address], [place], [place] [address]    Special Requests   Final    BOTTLES DRAWN AEROBIC AND ANAEROBIC Blood Culture adequate volume Performed at [facility] [address], [place], [place] [address]    Culture   Final    NO GROWTH 5 DAYS Performed at [facility], [place] [address], [place], [place] [address]    Report Status 07/26/2020 FINAL  Final  Respiratory (~20 pathogens) panel by PCR     Status: None   Collection Time: 07/21/20  6:14 AM   Specimen: Nasopharyngeal Swab; Respiratory  Result Value Ref Range Status   Adenovirus NOT DETECTED NOT DETECTED Final   Coronavirus 229E NOT DETECTED NOT DETECTED Final    Comment: (NOTE) The Coronavirus on the Respiratory Panel, DOES NOT test for the novel  Coronavirus (2019 nCoV)    Coronavirus HKU1 NOT DETECTED NOT DETECTED Final   Coronavirus NL63 NOT DETECTED NOT DETECTED Final   Coronavirus OC43 NOT DETECTED NOT DETECTED Final   Metapneumovirus NOT DETECTED NOT DETECTED Final   Rhinovirus / Enterovirus NOT DETECTED NOT DETECTED Final   Influenza A NOT DETECTED NOT DETECTED Final   Influenza B NOT DETECTED NOT DETECTED Final   Parainfluenza Virus 1 NOT  DETECTED NOT DETECTED Final   Parainfluenza Virus 2 NOT DETECTED NOT DETECTED Final   Parainfluenza Virus 3 NOT DETECTED NOT DETECTED Final   Parainfluenza Virus 4 NOT DETECTED NOT DETECTED Final   Respiratory Syncytial Virus NOT DETECTED NOT DETECTED Final   Bordetella pertussis NOT DETECTED NOT DETECTED Final   Bordetella  Parapertussis NOT DETECTED NOT DETECTED Final   Chlamydophila pneumoniae NOT DETECTED NOT DETECTED Final   Mycoplasma pneumoniae NOT DETECTED NOT DETECTED Final    Comment: Performed at [facility], [place] [address] [place], [place] [address]  Culture, Respiratory w Gram Stain     Status: None   Collection Time: 07/21/20  8:40 AM   Specimen: Tracheal Aspirate; Respiratory  Result Value Ref Range Status   Specimen Description   Final    TRACHEAL ASPIRATE Performed at [facility] [address] [place], [place] [address]    Special Requests   Final    NONE Performed at [facility], [place] [address] [place], [place] [address]    Gram Stain   Final    FEW WBC PRESENT,BOTH PMN AND MONONUCLEAR NO ORGANISMS SEEN    Culture   Final    NO GROWTH Performed at [facility], [place] [address], [place], [place] [address]    Report Status 07/23/2020 FINAL  Final  MRSA PCR Screening     Status: None   Collection Time: 07/23/20  9:00 AM   Specimen: Nasopharyngeal  Result Value Ref Range Status   MRSA by PCR NEGATIVE NEGATIVE Final    Comment:        The GeneXpert MRSA Assay (FDA approved for NASAL specimens only), is one component of a comprehensive MRSA colonization surveillance program. It is not intended to diagnose MRSA infection nor to guide or monitor treatment for MRSA infections. Performed at [facility], [place] [address], [place], [place] [address]     Pressure Injury 07/14/20 Buttocks Medial Stage 2 -  Partial thickness loss of dermis presenting as a shallow open injury with a red, pink wound bed without slough. red, open skin (Active)  07/14/20 1700  Location: Buttocks  Location Orientation: Medial  Staging: Stage 2 -  Partial thickness loss of dermis presenting as a shallow open injury with a red, pink wound bed without slough.  Wound Description (Comments): red, open skin  Present on Admission: Yes           [place]   [facility] If 7PM-7AM, please contact night-coverage at [email or web address], Office  [phone]   07/27/2020, 1:32 PM  LOS: 6 days

## 2020-07-27 NOTE — Progress Notes (Signed)
Nutrition Follow-up  DOCUMENTATION CODES:   Non-severe (moderate) malnutrition in context of chronic illness  INTERVENTION:  - continue Osmolite 1.2 @ 65 ml/hr with 45 ml Prosource TF TID and 100 ml free water every 4 hours.   NUTRITION DIAGNOSIS:   Moderate Malnutrition related to chronic illness,cancer and cancer related treatments as evidenced by moderate fat depletion,moderate muscle depletion,severe muscle depletion. -ongoing  GOAL:   Patient will meet greater than or equal to 90% of their needs -met with TF regimen  MONITOR:   TF tolerance,Labs,Weight trends,Skin  ASSESSMENT:   65 y.o. male with medical hx of GERD, DM, HTN, Hodgkin's lymphoma, COVID-19, and congenital hip dysplasia. He has a chronic trach and PEG. He was discharged from [facility] on 4/11 and returned to the ED a few hours later due to dyspnea and inability to maintain O2 sats.  Patient sleeping with no family or visitors present at this time. He is on trach collar and PEG in place.   He is receiving Osmolite 1.2 @ 65 ml/hr with 45 ml Prosource TF TID and 100 ml free water every 4 hours. This regimen is providing 1992 kcal, 119 grams protein, and 1879 ml free water.  Weight was stable 4/14-4/17 and is down 2 lb compared to weight yesterday. No information documented in the edema section of flow sheet.   CCM note from this AM states d/c may occur as early as later today.    Labs reviewed; CBGs: 126, 160, 135 mg/dl. Medications reviewed; sliding scale novolog, 15 ml multivitamin/day, 40 mg protonix per PEG/day.   Diet Order:   Diet Order            Diet NPO time specified  Diet effective now                 EDUCATION NEEDS:   No education needs have been identified at this time  Skin:  Skin Assessment: Skin Integrity Issues: Skin Integrity Issues:: Stage II Stage II: buttocks  Last BM:  4/17 (type 7 x1)  Height:   Ht Readings from Last 1 Encounters:  07/21/20 5' 6.5" (1.689 m)    Weight:    Wt Readings from Last 1 Encounters:  07/27/20 61.3 kg    Estimated Nutritional Needs:  Kcal:  1900-2100 kcal Protein:  100-115 grams Fluid:  >/= 2 L/day     Jarome Matin, MS, RD, LDN, CNSC Inpatient Clinical Dietitian RD pager # available in [place]  After hours/weekend pager # available in [facility]

## 2020-07-27 NOTE — Progress Notes (Signed)
Placed PT on new ATC setup- uneventful. 

## 2020-07-27 NOTE — Procedures (Signed)
Tracheostomy Change Note  Patient Details:   Name: Steve [name] DOB: 08-07-1955 MRN: [id number]    Airway Documentation:     Evaluation  O2 RKVT:55 Compllications:none Patient  tolerated procedure well. Bilateral Breath Sounds: Clear,Diminished   Per CCM order, changed trach to Shiley 6 cuffless.  Pt tolerated well, no complications    Martha Clan 07/27/2020, 9:01 AM

## 2020-07-27 NOTE — Progress Notes (Signed)
NAME:  Steve [name], MRN:  [id number], DOB:  [birth year], LOS: 6 ADMISSION DATE:  07/21/2020, CONSULTATION DATE:  4/12 REFERRING MD:  [place], CHIEF COMPLAINT:  Dyspnea   History of Present Illness:  65 y/o M who has chronic trach after recent prolonged hospitalization in 2021 with bilateral infiltrates related to COVID c/b ARDS and pulmonary fibrosis, +/- pneumonitis from Brentuximab.    He presented to [facility] ED 07/20/20 with dyspnea and inability to maintain O2 sats above 70% despite suctioning. EMS placed him on 15L O2 via trach collar and sats improved to 90%.  In ED, he was in distress on ATC and trach was ultimately changed to 6 cuffed shiley in order to place him on the ventilator.  PCCM subsequently called for admission.  He was admitted to [facility] from 4/5 through 4/11 for HCAP.  He was treated with a course of vanc and cefepime and discharged on Augmentin.  He is followed by Oncology for Hodgkin Lymphoma and received Brentuximab vedotin on 07/10/20.   Pertinent  Medical History  Hodgkin's Lymphoma  OSA  COVID - prolonged on vent, ARDS 2021 with trach, pneumothorax s/p CT's GERD HTN DM   Significant Hospital Events: Including procedures, antibiotic start and stop dates in addition to other pertinent events    4/12 Admit with hypoxic resp failure, briefly placed on mechanical ventilation, sputum cultured, hypotensive but responded to IVF's.  ABG with largely compensated hypercarbic resp failure   4/13 On ATC 60%. Multiple electrolyte disturbances.   4/14 ATC 40%, 10L (baseline 8L) . 4/16 called back because of frequent need to change inner cannula on tracheostomy  . 4/18 tolerating ATC well  Interim History / Subjective:  No acute events Remains of ATC via #6 shiley cuff No complaints Continues to cahnge inner cannula frequently, but no change in minimal secretions.   Objective   Blood pressure (!) 104/58, pulse 82, temperature 98 F (36.7 C), temperature source Oral, resp.  rate 20, height 5' 6.5" (1.689 m), weight 61.3 kg, SpO2 95 %.    FiO2 (%):  [35 %-40 %] 35 %   Intake/Output Summary (Last 24 hours) at 07/27/2020 0824 Last data filed at 07/27/2020 0330 Gross per 24 hour  Intake 945 ml  Output 1450 ml  Net -505 ml   Filed Weights   07/25/20 0409 07/26/20 0346 07/27/20 0404  Weight: 62.1 kg 62.3 kg 61.3 kg    Examination: General: Thin adult male in NAD   Neuro:  Alert, oriented, non-focal HEENT:  [place]/AT, No JVD noted, PERRL. Trach in place.  Cardiovascular:  RRR, no MRG Lungs:  Clear bilateral breath sounds.  Abdomen:  Soft, non-distended, non-tender.  Musculoskeletal:  No acute deformity, or ROM limitation Skin:  Intact, MMM   Labs/imaging that I havepersonally reviewed  (right click and "Reselect all SmartList Selections" daily)  4/12 CT chest > chronic fibrotic change predominantly focused in the periphery with increased ground glass opacifications and patchy consolidation throughout both lungs compared to the PET scan from March  Resolved Hospital Problem list     Assessment & Plan:  Acute on chronic respiratory failure with hypoxemia due to HCAP> Now recovered and back to baseline Baseline severe post COVID pulmonary fibrosis Changing tracheostomy inner cannula multiple times per day> at home he keeps 10 inner cannulae on hand at all times and changes them every 3-4 hours Based on my assessment 4/16 I don't feel that there is any new medical problem that needs to be addressed and his  current management is appropriate I don't really understand why he feels the need to change the inner cannula multiple times per day as it doesn't appear to be medically necessary> this almost seems like obsessive/compulsive type behavior Would continue trach collar as you are doing Monitor off antibiotics Likely change back to cuffless tracheostomy on 4/18 if remains stable Assuming he tolerates trach change well, [facility] is considering him for discharge today.    Physical deconditioning PT consult  Recurrent metastatic lymphoma Continue Brentuximab/management as per oncology  Rest per [facility]  Best practice (right click and "Reselect all SmartList Selections" daily)   Per [facility]  Labs   CBC: Recent Labs  Lab 07/21/20 0300 07/21/20 0605 07/22/20 0516 07/23/20 0412 07/24/20 0345  WBC 15.9* 14.3* 11.7* 12.4* 10.9*  NEUTROABS 11.5*  --   --   --   --   HGB 8.3* 7.9* 7.3* 7.9* 8.2*  HCT 28.3* 27.2* 25.1* 26.7* 27.7*  MCV 90.1 90.1 91.3 89.0 89.6  PLT 277 240 229 311 016    Basic Metabolic Panel: Recent Labs  Lab 07/21/20 0605 07/22/20 0516 07/22/20 1240 07/22/20 2034 07/23/20 0412 07/23/20 0500 07/24/20 0345  NA 133* 141 135  --  124* 140 139  K 4.9 2.8* 5.4*  --  3.9 4.6 4.8  CL 92* 112* 92*  --  89* 96* 94*  CO2 37* 26 39*  --  25 39* 41*  GLUCOSE 101* 118* 161*  --  497* 115* 142*  BUN 23 14 19   --  10 16 20   CREATININE 0.70 0.32* 0.65  --  0.33* 0.65 0.61  CALCIUM 9.2 5.7* 9.4  --  7.1* 8.9 9.4  MG 1.8 1.1* 2.9* 2.5*  --   --   --   PHOS 3.1  --   --   --   --   --   --    GFR: Estimated Creatinine Clearance: 80.9 mL/min (by C-G formula based on SCr of 0.61 mg/dL). Recent Labs  Lab 07/21/20 0605 07/22/20 0516 07/23/20 0412 07/24/20 0345  PROCALCITON 0.40  --   --   --   WBC 14.3* 11.7* 12.4* 10.9*    Liver Function Tests: No results for input(s): AST, ALT, ALKPHOS, BILITOT, PROT, ALBUMIN in the last 168 hours. No results for input(s): LIPASE, AMYLASE in the last 168 hours. No results for input(s): AMMONIA in the last 168 hours.  ABG    Component Value Date/Time   PHART 7.348 (L) 07/21/2020 0500   PCO2ART 74.2 (HH) 07/21/2020 0500   PO2ART 86.8 07/21/2020 0500   HCO3 39.7 (H) 07/21/2020 0500   TCO2 28 07/08/2019 1331   O2SAT 96.5 07/21/2020 0500     Coagulation Profile: No results for input(s): INR, PROTIME in the last 168 hours.  Cardiac Enzymes: No results for input(s): CKTOTAL, CKMB, CKMBINDEX,  TROPONINI in the last 168 hours.  HbA1C: Hgb A1c MFr Bld  Date/Time Value Ref Range Status  05/25/2020 05:42 PM 5.8 (H) 4.8 - 5.6 % Final    Comment:    (NOTE)         Prediabetes: 5.7 - 6.4         Diabetes: >6.4         Glycemic control for adults with diabetes: <7.0   03/23/2020 12:39 PM 5.7 (H) 4.8 - 5.6 % Final    Comment:    (NOTE) Pre diabetes:          5.7%-6.4%  Diabetes:              >  6.4%  Glycemic control for   <7.0% adults with diabetes     CBG: Recent Labs  Lab 07/26/20 1541 07/26/20 1931 07/26/20 2316 07/27/20 0328 07/27/20 0809  GLUCAP 113* 154* 99 126* 160*     Critical care time: NA     Georgann Housekeeper, AGACNP-BC [facility]  See Amion for personal pager PCCM on call pager [phone] until 7pm. Please call Elink 7p-7a. [phone]  07/27/2020 8:27 AM

## 2020-07-27 NOTE — Progress Notes (Signed)
Chaplain checked in with Steve [name] and offered support.  Steve [name] was doing well watching television.  Chaplain will continue to follow-up.     07/27/20 1200  Clinical Encounter Type  Visited With Patient  Visit Type Initial

## 2020-07-27 NOTE — Progress Notes (Signed)
PT refused trach suctioning at this time.

## 2020-07-27 NOTE — TOC Progression Note (Signed)
Transition of Care [facility]) - Progression Note    Patient Details  Name: Steve [name] MRN: [id number] Date of Birth: 11/12/1955  Transition of Care [facility]) CM/SW Contact  Leeroy Cha, RN Phone Number: 07/27/2020, 8:14 AM  Clinical Narrative:    1. Acute on chronic hypercapnic/hypoxemic respiratory failure-secondary to pulmonary fibrosis from COVID-19 and occupational lung disease, bronchiectasis.  He was empirically started on Zyvox and levofloxacin.  Antibiotics were discontinued as sputum culture showed no growth.  We will continue to wean off oxygen as tolerated.  Patient baseline oxygen is 8 L/min on trach collar. 2. Hypotension-patient was orthostatic on presentation, resolved after he was given IV fluid boluses on 07/21/2020.  At this time blood pressure is stable, continue to monitor. 3. Normocytic anemia-hemoglobin stable at 8.2.  Will transfuse for hemoglobin less than 7. 4. Hodgkin's lymphoma-patient followed by oncology PLAN: to return to home.  Possible dcd of [id number].        Expected Discharge Plan and Services           Expected Discharge Date:  (unknown)                                     Social Determinants of Health (SDOH) Interventions    Readmission Risk Interventions Readmission Risk Prevention Plan 07/22/2019  Transportation Screening Complete  Medication Review Press photographer) Complete  Some recent data might be hidden

## 2020-07-28 ENCOUNTER — Telehealth: Payer: Self-pay | Admitting: Medical

## 2020-07-28 LAB — GLUCOSE, CAPILLARY
Glucose-Capillary: 110 mg/dL — ABNORMAL HIGH (ref 70–99)
Glucose-Capillary: 151 mg/dL — ABNORMAL HIGH (ref 70–99)
Glucose-Capillary: 130 mg/dL — ABNORMAL HIGH (ref 70–99)

## 2020-07-28 MED ORDER — HEPARIN SOD (PORK) LOCK FLUSH 100 UNIT/ML IV SOLN
500.0000 [IU] | INTRAVENOUS | Status: AC | PRN
  Administered 2020-07-28: 500 [IU]
  Filled 2020-07-28: qty 5

## 2020-07-28 MED ORDER — ALBUTEROL SULFATE (2.5 MG/3ML) 0.083% IN NEBU: 2.5000 mg | INHALATION_SOLUTION | RESPIRATORY_TRACT | 6 refills | Status: DC | PRN

## 2020-07-28 NOTE — Telephone Encounter (Signed)
Rx sent to pharmacy   

## 2020-07-28 NOTE — Addendum Note (Signed)
Addended by: Jeronimo Greaves on: 07/28/2020 10:58 AM   Modules accepted: Orders

## 2020-07-28 NOTE — Progress Notes (Signed)
Physician Discharge Summary  Steve [name] YBO:[id number] DOB: [birth year] DOA: 07/21/2020  PCP: [name], PA-C  Admit date: 07/21/2020 Discharge date: 07/28/2020  Time spent: 50 minutes  Recommendations for Outpatient Follow-up:  1. Follow-up pulmonology as outpatient 2. 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  Patient presented to ED on 07/20/2020 with complaints of dyspnea and inability to maintain O2 sats above 70% despite suctioning.  EMS placed him on 15 L oxygen via trach collar and sats improved to 90%.  Patient was in distress on trach collar and trach was ultimately changed to 6 cuffed [place] .  PCCM was consulted.  ABG showed compensated hypercarbic respiratory failure.  Patient was on trach collar 60% FiO2 which has been weaned down to 10 L 40% FiO2.  Patient baseline is 8 L/min.    Hospital Course:  1. *Acute on chronic hypercapnic/hypoxemic respiratory failure-secondary to pulmonary fibrosis from COVID-19 and occupational lung disease, bronchiectasis.  He was empirically started on Zyvox and levofloxacin.  Antibiotics were discontinued as sputum culture showed no growth.    Oxygen was weaned off to baseline oxygen is 8 L/min on trach collar.  [name] was changed to cuffless tracheostomy yesterday.  Patient is stable for discharge as per pulmonology.  He will follow-up with pulmonology as outpatient. 2. Hypotension-patient was  orthostatic on presentation, resolved after he was given IV fluid boluses on 07/21/2020.  At this time blood pressure is stable 3. Normocytic anemia-hemoglobin stable at 8.2.   4. Hodgkin's lymphoma-patient followed by oncology   Procedures:  Trach changed to cuffless tracheostomy  Consultations:  Pulmonology  Discharge Exam: Vitals:   07/28/20 0800 07/28/20 0823  BP: (!) 151/79   Pulse: 100 92  Resp: (!) 24 19  Temp: 98.2 F (36.8 C)   SpO2: 91% 92%    General: Appears in no acute distress Cardiovascular: S1-S2, regular, no murmur auscultated Respiratory: Clear to auscultation bilaterally  Discharge Instructions   Discharge Instructions    Increase activity slowly   Complete by: As directed    No wound care   Complete by: As directed      Allergies as of 07/28/2020   No Known Allergies     Medication List    STOP taking these medications   amoxicillin-clavulanate 875-125 MG tablet Commonly known as: Augmentin   magnesium oxide 400 (241.3 Mg) MG tablet Commonly known as: MAG-OX     TAKE these medications   acetaminophen 160 MG/5ML solution Commonly known as: TYLENOL Place 20.3 mLs (650 mg total) into feeding tube every 4 (four) hours as needed for mild pain or fever (temp > 101.5).   albuterol (2.5 MG/3ML) 0.083% nebulizer solution Commonly known as: PROVENTIL Take 3 mLs (2.5 mg total) by nebulization every 2 (two) hours as needed for wheezing.   calamine lotion Apply topically as needed for itching.   chlorhexidine 0.12 % solution Commonly known as: PERIDEX 15 mLs by Mouth Rinse route 2 (two) times daily. What changed:   when  to take this  reasons to take this   Chlorhexidine Gluconate Cloth 2 % Pads Apply 6 each topically daily.   enoxaparin 40 MG/0.4ML injection Commonly known as: LOVENOX Inject 0.4 mLs (40 mg total) into the skin daily.   feeding supplement (OSMOLITE 1.2 CAL) Liqd Place 1,000 mLs into feeding tube continuous. What  changed: how much to take   feeding supplement (PRO-STAT SUGAR FREE 64) Liqd Place 30 mLs into feeding tube daily.   free water Soln Place 100 mLs into feeding tube every 4 (four) hours. What changed: how much to take   furosemide 20 MG tablet Commonly known as: LASIX Place 1 tablet (20 mg total) into feeding tube daily.   guaiFENesin 100 MG/5ML Soln Commonly known as: ROBITUSSIN Place 5 mLs (100 mg total) into feeding tube every 4 (four) hours as needed for cough or to loosen phlegm.   lidocaine-prilocaine cream Commonly known as: EMLA Apply to affected area once What changed:   how much to take  how to take this  when to take this  reasons to take this   loperamide HCl 1 MG/7.5ML suspension Commonly known as: IMODIUM Place 15 mLs (2 mg total) into feeding tube as needed for diarrhea or loose stools (maximum 16mg  per day).   LORazepam 0.5 MG tablet Commonly known as: Ativan Take 1 tablet (0.5 mg total) by mouth every 6 (six) hours as needed (Nausea or vomiting).   multivitamin Liqd Place 15 mLs into feeding tube daily.   ondansetron 4 MG tablet Commonly known as: ZOFRAN Take 1 tablet (4 mg total) by mouth every 6 (six) hours as needed for nausea.   sertraline 50 MG tablet Commonly known as: ZOLOFT GIVE 1 TAB PER TUBE DAILY   traZODone 100 MG tablet Commonly known as: DESYREL Place 1 tablet (100 mg total) into feeding tube at bedtime.   triamcinolone cream 0.1 % Commonly known as: KENALOG Apply 1 application topically 2 (two) times daily as needed (itching).   Vitamin D3 10 MCG/ML Liqd Give 400 Units by tube daily. What changed: how to take this      No Known Allergies    The results of significant diagnostics from this hospitalization (including imaging, microbiology, ancillary and laboratory) are listed below for reference.    Significant Diagnostic Studies: DG Chest 2 View  Result Date: 07/14/2020 CLINICAL DATA:  Shortness of breath. EXAM:  CHEST - 2 VIEW COMPARISON:  Chest x-ray 05/25/2020 FINDINGS: The tracheostomy tube is in good position, unchanged. The power port is in good position, unchanged. Stable mild cardiac enlargement and prominent mediastinal and hilar contours. Severe chronic underlying lung disease likely post pneumonic pulmonary fibrosis. Low lung volumes with vascular crowding and atelectasis. Slight increase in patchy airspace opacity suspicious for superimposed infiltrates. No definite pleural effusions. IMPRESSION: Severe chronic underlying lung disease with suspected superimposed infiltrates. Electronically Signed   By: Marijo Sanes M.D.   On: 07/14/2020 14:13   CT CHEST WO CONTRAST  Result Date: 07/22/2020 CLINICAL DATA:  Cough, respiratory failure. EXAM: CT CHEST WITHOUT CONTRAST TECHNIQUE: Multidetector CT imaging of the chest was performed following the standard protocol without IV contrast. COMPARISON:  05/25/2020.  PET 06/19/2020. FINDINGS: Cardiovascular: Left IJ Port-A-Cath terminates in the right atrium. Atherosclerotic calcification of the aorta. Pulmonic trunk and heart are enlarged. No pericardial effusion. Mediastinum/Nodes: Bulky low internal jugular mediastinal and prepericardiac/juxtadiaphragmatic adenopathy. Index low right paratracheal lymph node measures 3.3 cm, as on 05/25/2020. Hilar regions are difficult to evaluate without IV contrast.  No axillary adenopathy. Esophagus is grossly unremarkable. Lungs/Pleura: Tracheostomy is in place. Image quality is markedly degraded by expiratory phase imaging. Underlying pattern of severe pulmonary fibrosis with superimposed peribronchovascular nodularity and consolidation as well as dependent atelectasis, right greater than left. Thin rind of pleural fluid in the right hemithorax, new. Upper Abdomen: Visualized portion of the liver is unremarkable. Stones in the gallbladder. Adrenal glands are unremarkable. There may be right caliectasis, incompletely visualized.  Visualized portions of the kidneys, spleen, pancreas, stomach and bowel otherwise unremarkable. Musculoskeletal: Degenerative changes in the spine. No worrisome lytic or sclerotic lesions. IMPRESSION: 1. Image quality is degraded by expiratory phase imaging. New peribronchovascular nodularity/consolidation, indicative of an infectious bronchiolitis/bronchopneumonia, superimposed on severe pulmonary fibrosis. 2. New thin rind of right pleural fluid. 3. Bulky intrathoracic adenopathy and osseous metastatic disease, better evaluated on PET 06/19/2020. 4. Cholelithiasis. 5. Difficult to exclude mild caliectasis in the right kidney, incompletely visualized. 6.  Aortic atherosclerosis (ICD10-I70.0). 7. Enlarged pulmonic trunk, indicative of pulmonary arterial hypertension. Electronically Signed   By: Lorin Picket M.D.   On: 07/22/2020 08:09   DG CHEST PORT 1 VIEW  Result Date: 07/21/2020 CLINICAL DATA:  Hypoxemia. Recent COVID with ARDS and pulmonary fibrosis. EXAM: PORTABLE CHEST 1 VIEW COMPARISON:  Radiograph earlier today. FINDINGS: Tracheostomy tube tip at the thoracic inlet. Accessed left chest port in place. Persistent low lung volumes. Stable heart size and mediastinal contours. Again seen elevation of right hemidiaphragm. Coarse bilateral lung opacities are not significantly changed from earlier today. No pneumothorax or large pleural effusion. IMPRESSION: 1. Persistent low lung volumes. Coarse bilateral lung opacities are not significantly changed from earlier today, likely representing infectious or inflammatory process superimposed on pulmonary fibrosis. 2. Tracheostomy tube tip at the thoracic inlet. Electronically Signed   By: Keith Rake M.D.   On: 07/21/2020 15:35   DG Chest Port 1 View  Result Date: 07/21/2020 CLINICAL DATA:  Dyspnea EXAM: PORTABLE CHEST 1 VIEW COMPARISON:  07/14/2020 FINDINGS: Tracheostomy and left internal jugular chest port with its tip within the right atrium are  unchanged. Lung volumes are extremely small with asymmetric right-sided volume loss with elevation of the right hemidiaphragm, however, pulmonary insufflation remain stable since prior examination. Superimposed coarse pulmonary infiltrates appears slightly improved since prior examination, likely infectious or inflammatory in nature. No pneumothorax or pleural effusion. Cardiac size is within normal limits when accounting for poor pulmonary insufflation. Bilateral hilar enlargement is unchanged. IMPRESSION: Stable pulmonary hypoinflation. Slight interval improvement in bilateral coarse pulmonary infiltrates, likely infectious or inflammatory in nature. Superimposed peripheral pulmonary infiltrates in keeping with underlying interstitial lung disease better appreciated on prior CT examination of 05/25/2020 are again noted. Electronically Signed   By: Fidela Salisbury MD   On: 07/21/2020 01:23    Microbiology: Recent Results (from the past 240 hour(s))  Culture, blood (routine x 2)     Status: None   Collection Time: 07/21/20  6:08 AM   Specimen: BLOOD  Result Value Ref Range Status   Specimen Description   Final    BLOOD LEFT ANTECUBITAL Performed at [facility] [address] [place], [place] [address]    Special Requests   Final    BOTTLES DRAWN AEROBIC AND ANAEROBIC Blood Culture adequate volume Performed at [facility] [address], [place], [place] [address]    Culture   Final    NO GROWTH 5 DAYS Performed at [facility], [place] [address] [place], [place] [address]  Report Status 07/26/2020 FINAL  Final  Culture, blood (routine x 2)     Status: None   Collection Time: 07/21/20  6:13 AM   Specimen: BLOOD  Result Value Ref Range Status   Specimen Description   Final    BLOOD RIGHT ANTECUBITAL Performed at [facility] [address], [place], [place] [address]    Special Requests   Final    BOTTLES DRAWN AEROBIC  AND ANAEROBIC Blood Culture adequate volume Performed at [facility] [address] [place], [place] [address]    Culture   Final    NO GROWTH 5 DAYS Performed at [facility], [place] [address] [place], [place] [address]    Report Status 07/26/2020 FINAL  Final  Respiratory (~20 pathogens) panel by PCR     Status: None   Collection Time: 07/21/20  6:14 AM   Specimen: Nasopharyngeal Swab; Respiratory  Result Value Ref Range Status   Adenovirus NOT DETECTED NOT DETECTED Final   Coronavirus 229E NOT DETECTED NOT DETECTED Final    Comment: (NOTE) The Coronavirus on the Respiratory Panel, DOES NOT test for the novel  Coronavirus (2019 nCoV)    Coronavirus HKU1 NOT DETECTED NOT DETECTED Final   Coronavirus NL63 NOT DETECTED NOT DETECTED Final   Coronavirus OC43 NOT DETECTED NOT DETECTED Final   Metapneumovirus NOT DETECTED NOT DETECTED Final   Rhinovirus / Enterovirus NOT DETECTED NOT DETECTED Final   Influenza A NOT DETECTED NOT DETECTED Final   Influenza B NOT DETECTED NOT DETECTED Final   Parainfluenza Virus 1 NOT DETECTED NOT DETECTED Final   Parainfluenza Virus 2 NOT DETECTED NOT DETECTED Final   Parainfluenza Virus 3 NOT DETECTED NOT DETECTED Final   Parainfluenza Virus 4 NOT DETECTED NOT DETECTED Final   Respiratory Syncytial Virus NOT DETECTED NOT DETECTED Final   Bordetella pertussis NOT DETECTED NOT DETECTED Final   Bordetella Parapertussis NOT DETECTED NOT DETECTED Final   Chlamydophila pneumoniae NOT DETECTED NOT DETECTED Final   Mycoplasma pneumoniae NOT DETECTED NOT DETECTED Final    Comment: Performed at [facility], [place]. [address] [place], [place] [address]  Culture, Respiratory w Gram Stain     Status: None   Collection Time: 07/21/20  8:40 AM   Specimen: Tracheal Aspirate; Respiratory  Result Value Ref Range Status   Specimen Description   Final    TRACHEAL ASPIRATE Performed at [facility] [address], [place], [place] [address]    Special Requests   Final    NONE Performed at [facility], [place] [address], [place], [place] [address]    Gram Stain   Final    FEW WBC PRESENT,BOTH PMN AND MONONUCLEAR NO ORGANISMS SEEN    Culture   Final    NO GROWTH Performed at [facility], [place] [address] [place], [place] [address]    Report Status 07/23/2020 FINAL  Final  MRSA PCR Screening     Status: None   Collection Time: 07/23/20  9:00 AM   Specimen: Nasopharyngeal  Result Value Ref Range Status   MRSA by PCR NEGATIVE NEGATIVE Final    Comment:        The GeneXpert MRSA Assay (FDA approved for NASAL specimens only), is one component of a comprehensive MRSA colonization surveillance program. It is not intended to diagnose MRSA infection nor to guide or monitor treatment for MRSA infections. Performed at [facility], [place]  [address] [place], [place] [address]      Labs: Basic Metabolic Panel: Recent Labs  Lab 07/22/20 0516 07/22/20 1240 07/22/20 2034 07/23/20 0412 07/23/20 0500 07/24/20 0345  NA 141 135  --  124* 140 139  K 2.8* 5.4*  --  3.9 4.6 4.8  CL 112* 92*  --  89* 96* 94*  CO2 26 39*  --  25 39* 41*  GLUCOSE 118* 161*  --  497* 115* 142*  BUN 14 19  --  10 16 20   CREATININE 0.32* 0.65  --  0.33* 0.65 0.61  CALCIUM 5.7* 9.4  --  7.1* 8.9 9.4  MG 1.1* 2.9* 2.5*  --   --   --    Liver Function Tests: No results for input(s): AST, ALT, ALKPHOS, BILITOT, PROT, ALBUMIN in the last 168 hours. No results for input(s): LIPASE, AMYLASE in the last 168 hours. No results for input(s): AMMONIA in the last 168 hours. CBC: Recent Labs  Lab 07/22/20 0516 07/23/20 0412 07/24/20 0345  WBC 11.7* 12.4* 10.9*  HGB 7.3* 7.9* 8.2*  HCT 25.1* 26.7* 27.7*  MCV 91.3 89.0 89.6  PLT 229 311 274   Cardiac Enzymes: No results for input(s): CKTOTAL, CKMB, CKMBINDEX, TROPONINI in the last 168 hours. BNP: BNP (last 3 results) Recent  Labs    07/21/20 0605  BNP 141.5*    ProBNP (last 3 results) No results for input(s): PROBNP in the last 8760 hours.  CBG: Recent Labs  Lab 07/27/20 1623 07/27/20 1941 07/27/20 2325 07/28/20 0334 07/28/20 0805  GLUCAP 129* 117* 150* 110* 151*       Signed:  Oswald Hillock MD.  [facility] 07/28/2020, 8:49 AM

## 2020-07-28 NOTE — TOC Transition Note (Addendum)
Transition of Care [facility]) - CM/SW Discharge Note   Patient Details  Name: Steve [name] MRN: [id number] Date of Birth: 1955/12/14  Transition of Care [facility] CM/SW Contact:  Leeroy Cha, RN Phone Number: 07/28/2020, 8:41 AM   Clinical Narrative:    Patient is being dcd to return to home via ptar, on 8l/min tc of o2. Spoke with wife needs an extra o2 concentrator at the home due to high volume of o2 needs.  texted to zack blank of [employer] to send concentrator to the home. PATIENT DCD TO HOME PTAR CALLEED AT 5638        Patient Goals and CMS Choice        Discharge Placement                       Discharge Plan and Services                                     Social Determinants of Health (SDOH) Interventions     Readmission Risk Interventions Readmission Risk Prevention Plan 07/22/2019  Transportation Screening Complete  Medication Review [employer]) Complete  Some recent data might be hidden

## 2020-07-28 NOTE — Progress Notes (Signed)
PT denies need for trach suctioning at this time. 

## 2020-07-28 NOTE — Plan of Care (Signed)
Pt adequate for discharge

## 2020-07-28 NOTE — Discharge Summary (Signed)
Physician Discharge Summary  Steve Steve [name] WVP:[id number] DOB: Mar 08, 1956 DOA: 07/21/2020  PCP: [name], PA-C  Admit date: 07/21/2020 Discharge date: 07/28/2020  Time spent: 50 minutes  Recommendations for Outpatient Follow-up:  1. Follow-up pulmonology as outpatient 2. 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  Patient presented to ED on 07/20/2020 with complaints of dyspnea and inability to maintain O2 sats above 70% despite suctioning.  EMS placed him on 15 L oxygen via trach collar and sats improved to 90%.  Patient was in distress on trach collar and trach was ultimately changed to 6 cuffed [place] .  PCCM was consulted.  ABG showed compensated hypercarbic respiratory failure.  Patient was on trach collar 60% FiO2 which has been weaned down to 10 L 40% FiO2.  Patient baseline is 8 L/min.    Hospital Course:  1. *Acute on chronic hypercapnic/hypoxemic respiratory failure-secondary to pulmonary fibrosis from COVID-19 and occupational lung disease, bronchiectasis.  He was empirically started on Zyvox and levofloxacin.  Antibiotics were discontinued as sputum culture showed no growth.    Oxygen was weaned off to baseline oxygen is 8 L/min on trach collar.  [name] was changed to cuffless tracheostomy yesterday.  Patient is stable for discharge as per pulmonology.  He will follow-up with pulmonology as outpatient. 2. Hypotension-patient was  orthostatic on presentation, resolved after he was given IV fluid boluses on 07/21/2020.  At this time blood pressure is stable 3. Normocytic anemia-hemoglobin stable at 8.2.   4. Hodgkin's lymphoma-patient followed by oncology   Procedures:  Trach changed to cuffless tracheostomy  Consultations:  Pulmonology  Discharge Exam: Vitals:   07/28/20 1200 07/28/20 1300  BP: 107/65 109/61  Pulse: 88 87  Resp: (!) 34 (!) 26  Temp: 98.3 F (36.8 C)   SpO2: 91% 93%    General: Appears in no acute distress Cardiovascular: S1-S2, regular, no murmur auscultated Respiratory: Clear to auscultation bilaterally  Discharge Instructions   Discharge Instructions    Increase activity slowly   Complete by: As directed    No wound care   Complete by: As directed      Allergies as of 07/28/2020   No Known Allergies     Medication List    STOP taking these medications   amoxicillin-clavulanate 875-125 MG tablet Commonly known as: Augmentin   magnesium oxide 400 (241.3 Mg) MG tablet Commonly known as: MAG-OX     TAKE these medications   acetaminophen 160 MG/5ML solution Commonly known as: TYLENOL Place 20.3 mLs (650 mg total) into feeding tube every 4 (four) hours as needed for mild pain or fever (temp > 101.5).   albuterol (2.5 MG/3ML) 0.083% nebulizer solution Commonly known as: PROVENTIL Take 3 mLs (2.5 mg total) by nebulization every 2 (two) hours as needed for wheezing.   calamine lotion Apply topically as needed for itching.   chlorhexidine 0.12 % solution Commonly known as: PERIDEX 15 mLs by Mouth Rinse route 2 (two) times daily. What changed:   when  to take this  reasons to take this   Chlorhexidine Gluconate Cloth 2 % Pads Apply 6 each topically daily.   enoxaparin 40 MG/0.4ML injection Commonly known as: LOVENOX Inject 0.4 mLs (40 mg total) into the skin daily.   feeding supplement (OSMOLITE 1.2 CAL) Liqd Place 1,000 mLs into feeding tube  continuous. What changed: how much to take   feeding supplement (PRO-STAT SUGAR FREE 64) Liqd Place 30 mLs into feeding tube daily.   free water Soln Place 100 mLs into feeding tube every 4 (four) hours. What changed: how much to take   furosemide 20 MG tablet Commonly known as: LASIX Place 1 tablet (20 mg total) into feeding tube daily.   guaiFENesin 100 MG/5ML Soln Commonly known as: ROBITUSSIN Place 5 mLs (100 mg total) into feeding tube every 4 (four) hours as needed for cough or to loosen phlegm.   lidocaine-prilocaine cream Commonly known as: EMLA Apply to affected area once What changed:   how much to take  how to take this  when to take this  reasons to take this   loperamide HCl 1 MG/7.5ML suspension Commonly known as: IMODIUM Place 15 mLs (2 mg total) into feeding tube as needed for diarrhea or loose stools (maximum 16mg  per day).   LORazepam 0.5 MG tablet Commonly known as: Ativan Take 1 tablet (0.5 mg total) by mouth every 6 (six) hours as needed (Nausea or vomiting).   multivitamin Liqd Place 15 mLs into feeding tube daily.   ondansetron 4 MG tablet Commonly known as: ZOFRAN Take 1 tablet (4 mg total) by mouth every 6 (six) hours as needed for nausea.   sertraline 50 MG tablet Commonly known as: ZOLOFT GIVE 1 TAB PER TUBE DAILY   traZODone 100 MG tablet Commonly known as: DESYREL Place 1 tablet (100 mg total) into feeding tube at bedtime.   triamcinolone cream 0.1 % Commonly known as: KENALOG Apply 1 application topically 2 (two) times daily as needed (itching).   Vitamin D3 10 MCG/ML Liqd Give 400 Units by tube daily. What changed: how to take this      No Known Allergies  Follow-up Information    Saguier, Percell Miller, PA-C Follow up in 2 week(s).   Specialties: Internal Medicine, Family Medicine Contact information: [address] [place] [address] [phone]                The results of significant  diagnostics from this hospitalization (including imaging, microbiology, ancillary and laboratory) are listed below for reference.    Significant Diagnostic Studies: DG Chest 2 View  Result Date: 07/14/2020 CLINICAL DATA:  Shortness of breath. EXAM: CHEST - 2 VIEW COMPARISON:  Chest x-ray 05/25/2020 FINDINGS: The tracheostomy tube is in good position, unchanged. The power port is in good position, unchanged. Stable mild cardiac enlargement and prominent mediastinal and hilar contours. Severe chronic underlying lung disease likely post pneumonic pulmonary fibrosis. Low lung volumes with vascular crowding and atelectasis. Slight increase in patchy airspace opacity suspicious for superimposed infiltrates. No definite pleural effusions. IMPRESSION: Severe chronic underlying lung disease with suspected superimposed infiltrates. Electronically Signed   By: Marijo Sanes M.D.   On: 07/14/2020 14:13   CT CHEST WO CONTRAST  Result Date: 07/22/2020 CLINICAL DATA:  Cough, respiratory failure. EXAM: CT CHEST WITHOUT CONTRAST TECHNIQUE: Multidetector CT imaging of the chest was performed following the standard protocol without IV contrast. COMPARISON:  05/25/2020.  PET 06/19/2020. FINDINGS: Cardiovascular: Left IJ Port-A-Cath terminates in the  right atrium. Atherosclerotic calcification of the aorta. Pulmonic trunk and heart are enlarged. No pericardial effusion. Mediastinum/Nodes: Bulky low internal jugular mediastinal and prepericardiac/juxtadiaphragmatic adenopathy. Index low right paratracheal lymph node measures 3.3 cm, as on 05/25/2020. Hilar regions are difficult to evaluate without IV contrast. No axillary adenopathy. Esophagus is grossly unremarkable. Lungs/Pleura: Tracheostomy is in place. Image quality is markedly degraded by expiratory phase imaging. Underlying pattern of severe pulmonary fibrosis with superimposed peribronchovascular nodularity and consolidation as well as dependent atelectasis, right greater  than left. Thin rind of pleural fluid in the right hemithorax, new. Upper Abdomen: Visualized portion of the liver is unremarkable. Stones in the gallbladder. Adrenal glands are unremarkable. There may be right caliectasis, incompletely visualized. Visualized portions of the kidneys, spleen, pancreas, stomach and bowel otherwise unremarkable. Musculoskeletal: Degenerative changes in the spine. No worrisome lytic or sclerotic lesions. IMPRESSION: 1. Image quality is degraded by expiratory phase imaging. New peribronchovascular nodularity/consolidation, indicative of an infectious bronchiolitis/bronchopneumonia, superimposed on severe pulmonary fibrosis. 2. New thin rind of right pleural fluid. 3. Bulky intrathoracic adenopathy and osseous metastatic disease, better evaluated on PET 06/19/2020. 4. Cholelithiasis. 5. Difficult to exclude mild caliectasis in the right kidney, incompletely visualized. 6.  Aortic atherosclerosis (ICD10-I70.0). 7. Enlarged pulmonic trunk, indicative of pulmonary arterial hypertension. Electronically Signed   By: Lorin Picket M.D.   On: 07/22/2020 08:09   DG CHEST PORT 1 VIEW  Result Date: 07/21/2020 CLINICAL DATA:  Hypoxemia. Recent COVID with ARDS and pulmonary fibrosis. EXAM: PORTABLE CHEST 1 VIEW COMPARISON:  Radiograph earlier today. FINDINGS: Tracheostomy tube tip at the thoracic inlet. Accessed left chest port in place. Persistent low lung volumes. Stable heart size and mediastinal contours. Again seen elevation of right hemidiaphragm. Coarse bilateral lung opacities are not significantly changed from earlier today. No pneumothorax or large pleural effusion. IMPRESSION: 1. Persistent low lung volumes. Coarse bilateral lung opacities are not significantly changed from earlier today, likely representing infectious or inflammatory process superimposed on pulmonary fibrosis. 2. Tracheostomy tube tip at the thoracic inlet. Electronically Signed   By: Keith Rake M.D.   On:  07/21/2020 15:35   DG Chest Port 1 View  Result Date: 07/21/2020 CLINICAL DATA:  Dyspnea EXAM: PORTABLE CHEST 1 VIEW COMPARISON:  07/14/2020 FINDINGS: Tracheostomy and left internal jugular chest port with its tip within the right atrium are unchanged. Lung volumes are extremely small with asymmetric right-sided volume loss with elevation of the right hemidiaphragm, however, pulmonary insufflation remain stable since prior examination. Superimposed coarse pulmonary infiltrates appears slightly improved since prior examination, likely infectious or inflammatory in nature. No pneumothorax or pleural effusion. Cardiac size is within normal limits when accounting for poor pulmonary insufflation. Bilateral hilar enlargement is unchanged. IMPRESSION: Stable pulmonary hypoinflation. Slight interval improvement in bilateral coarse pulmonary infiltrates, likely infectious or inflammatory in nature. Superimposed peripheral pulmonary infiltrates in keeping with underlying interstitial lung disease better appreciated on prior CT examination of 05/25/2020 are again noted. Electronically Signed   By: Fidela Salisbury MD   On: 07/21/2020 01:23    Microbiology: Recent Results (from the past 240 hour(s))  Culture, blood (routine x 2)     Status: None   Collection Time: 07/21/20  6:08 AM   Specimen: BLOOD  Result Value Ref Range Status   Specimen Description   Final    BLOOD LEFT ANTECUBITAL Performed at [name] [address] [place], [place] [address]    Special Requests   Final    BOTTLES DRAWN AEROBIC AND  ANAEROBIC Blood Culture adequate volume Performed at [facility] [address], [place], [place] [address]    Culture   Final    NO GROWTH 5 DAYS Performed at [facility], [place] [address] [place], [place] [address]    Report Status 07/26/2020 FINAL  Final  Culture, blood (routine x 2)     Status: None   Collection Time: 07/21/20  6:13 AM   Specimen:  BLOOD  Result Value Ref Range Status   Specimen Description   Final    BLOOD RIGHT ANTECUBITAL Performed at [facility] [address], [place], [place] [address]    Special Requests   Final    BOTTLES DRAWN AEROBIC AND ANAEROBIC Blood Culture adequate volume Performed at [facility] [address] [place], [place] [address]    Culture   Final    NO GROWTH 5 DAYS Performed at [facility], [place] [address] [place], [place] [address]    Report Status 07/26/2020 FINAL  Final  Respiratory (~20 pathogens) panel by PCR     Status: None   Collection Time: 07/21/20  6:14 AM   Specimen: Nasopharyngeal Swab; Respiratory  Result Value Ref Range Status   Adenovirus NOT DETECTED NOT DETECTED Final   Coronavirus 229E NOT DETECTED NOT DETECTED Final    Comment: (NOTE) The Coronavirus on the Respiratory Panel, DOES NOT test for the novel  Coronavirus (2019 nCoV)    Coronavirus HKU1 NOT DETECTED NOT DETECTED Final   Coronavirus NL63 NOT DETECTED NOT DETECTED Final   Coronavirus OC43 NOT DETECTED NOT DETECTED Final   Metapneumovirus NOT DETECTED NOT DETECTED Final   Rhinovirus / Enterovirus NOT DETECTED NOT DETECTED Final   Influenza A NOT DETECTED NOT DETECTED Final   Influenza B NOT DETECTED NOT DETECTED Final   Parainfluenza Virus 1 NOT DETECTED NOT DETECTED Final   Parainfluenza Virus 2 NOT DETECTED NOT DETECTED Final   Parainfluenza Virus 3 NOT DETECTED NOT DETECTED Final   Parainfluenza Virus 4 NOT DETECTED NOT DETECTED Final   Respiratory Syncytial Virus NOT DETECTED NOT DETECTED Final   Bordetella pertussis NOT DETECTED NOT DETECTED Final   Bordetella Parapertussis NOT DETECTED NOT DETECTED Final   Chlamydophila pneumoniae NOT DETECTED NOT DETECTED Final   Mycoplasma pneumoniae NOT DETECTED NOT DETECTED Final    Comment: Performed at [facility], [place]. [address], [place], [place] [address]  Culture, Respiratory w Gram Stain      Status: None   Collection Time: 07/21/20  8:40 AM   Specimen: Tracheal Aspirate; Respiratory  Result Value Ref Range Status   Specimen Description   Final    TRACHEAL ASPIRATE Performed at [facility] [address], [place], [place] [address]    Special Requests   Final    NONE Performed at [facility], [place] [address], [place], [place] [address]    Gram Stain   Final    FEW WBC PRESENT,BOTH PMN AND MONONUCLEAR NO ORGANISMS SEEN    Culture   Final    NO GROWTH Performed at [facility], [place] [address], [place], [place] [address]    Report Status 07/23/2020 FINAL  Final  MRSA PCR Screening     Status: None   Collection Time: 07/23/20  9:00 AM   Specimen: Nasopharyngeal  Result Value Ref Range Status   MRSA by PCR NEGATIVE NEGATIVE Final    Comment:  The GeneXpert MRSA Assay (FDA approved for NASAL specimens only), is one component of a comprehensive MRSA colonization surveillance program. It is not intended to diagnose MRSA infection nor to guide or monitor treatment for MRSA infections. Performed at [facility], [place] [address] [place], [place] [address]      Labs: Basic Metabolic Panel: Recent Labs  Lab 07/22/20 0516 07/22/20 1240 07/22/20 2034 07/23/20 0412 07/23/20 0500 07/24/20 0345  NA 141 135  --  124* 140 139  K 2.8* 5.4*  --  3.9 4.6 4.8  CL 112* 92*  --  89* 96* 94*  CO2 26 39*  --  25 39* 41*  GLUCOSE 118* 161*  --  497* 115* 142*  BUN 14 19  --  10 16 20   CREATININE 0.32* 0.65  --  0.33* 0.65 0.61  CALCIUM 5.7* 9.4  --  7.1* 8.9 9.4  MG 1.1* 2.9* 2.5*  --   --   --    Liver Function Tests: No results for input(s): AST, ALT, ALKPHOS, BILITOT, PROT, ALBUMIN in the last 168 hours. No results for input(s): LIPASE, AMYLASE in the last 168 hours. No results for input(s): AMMONIA in the last 168 hours. CBC: Recent Labs  Lab 07/22/20 0516 07/23/20 0412 07/24/20 0345  WBC  11.7* 12.4* 10.9*  HGB 7.3* 7.9* 8.2*  HCT 25.1* 26.7* 27.7*  MCV 91.3 89.0 89.6  PLT 229 311 274   Cardiac Enzymes: No results for input(s): CKTOTAL, CKMB, CKMBINDEX, TROPONINI in the last 168 hours. BNP: BNP (last 3 results) Recent Labs    07/21/20 0605  BNP 141.5*    ProBNP (last 3 results) No results for input(s): PROBNP in the last 8760 hours.  CBG: Recent Labs  Lab 07/27/20 1941 07/27/20 2325 07/28/20 0334 07/28/20 0805 07/28/20 1159  GLUCAP 117* 150* 110* 151* 130*       Signed:  Oswald Hillock MD.  [facility] 07/28/2020, 4:58 PM

## 2020-07-28 NOTE — Progress Notes (Signed)
RN reviewed AVS with patient and answered all questions at this time. Social worker has confirmed pt is ready for discharge. IV team deaccessed port this AM per orders. Pt was just picked up by [facility], and all belongings and AVS was sent with pt.

## 2020-07-28 NOTE — Telephone Encounter (Signed)
I tried to send albuterol solution to pt pharmacy. I think it is only printing? Can't change so fax to pharmacy or call.

## 2020-07-30 ENCOUNTER — Telehealth: Payer: Self-pay

## 2020-07-30 NOTE — Telephone Encounter (Signed)
Pts wife called and had to r/s the appts from Friday 4/22 due to not having any O2 to get him here.  States that she does not have enough even if we switch him over when they arrive here.  She also states that she has to have help getting him here and is limited on the days the help is avail.  Pt appt was moved to 4/27 and she is aware of the potential delays   Kinleigh Nault

## 2020-07-31 ENCOUNTER — Inpatient Hospital Stay: Payer: BC Managed Care – PPO

## 2020-07-31 ENCOUNTER — Encounter: Payer: Self-pay | Admitting: *Deleted

## 2020-07-31 ENCOUNTER — Inpatient Hospital Stay: Payer: BC Managed Care – PPO | Admitting: Hematology & Oncology

## 2020-07-31 NOTE — Progress Notes (Signed)
Patient's wife had to reschedule today's appointment to next week due to issues with oxygen tank supply. Patient was recently discharged from hospital on 07/28/20.  Called and spoke to Steve [name]. She states that patient is doing well since discharge and his oxygen levels have been staying in good range. She continues to have issues with oxygen supply and equipment through [facility].  Per hospital note from case manager, a request was made to supply patient with additional concentrator at home due to high O2 needs. Steve [name] states she has not heard from [employer] about supplying this additional concentrator and they have not given her additional tanks to allow for travel.   I offered to call [employer] but Steve [name] states she no longer wants to deal with them as they have been "horrendous to deal with". She would like his oxygen to go through [facility] as she worked with them last year and they were much easier to work with. She states she just needs to call them.   While I did offer to help in whatever way I could, I informed her that any oxygen orders would need to come from his pulmonologist. She understood. She will reach out to [facility] and see if they can provide oxygen care for them.   Oncology Nurse Navigator Documentation  Oncology Nurse Navigator Flowsheets 07/31/2020  Abnormal Finding Date -  Confirmed Diagnosis Date -  Diagnosis Status -  Planned Course of Treatment -  Phase of Treatment -  Chemotherapy Pending- Reason: -  Chemotherapy Actual Start Date: -  Navigator Follow Up Date: 08/07/2020  Navigator Follow Up Reason: Follow-up Appointment;Chemotherapy  Navigator Location [facility]  Referral Date to RadOnc/MedOnc -  Navigator Encounter Type Telephone  Telephone Patient Update;Asess Navigation Needs;Outgoing Call  Treatment Initiated Date -  Patient Visit Type MedOnc  Treatment Phase Active Tx  Barriers/Navigation Needs Coordination of Care;Education;Employed;Family  Concerns;Morbidities/Frailty;Multiple Hospital Admissions  Education Other  Interventions Education;Psycho-Social Support  Acuity Level 4-High Needs (Greater Than 4 Barriers Identified)  Referrals -  Coordination of Care -  Education Method Verbal  Support Groups/Services Friends and Family  Time Spent with Patient 30

## 2020-08-03 ENCOUNTER — Telehealth: Payer: Self-pay | Admitting: Emergency Medicine

## 2020-08-03 ENCOUNTER — Telehealth: Payer: Self-pay

## 2020-08-03 ENCOUNTER — Other Ambulatory Visit: Payer: Self-pay | Admitting: Medical

## 2020-08-03 NOTE — Telephone Encounter (Signed)
S/w pts wife michelle and she is aware of the new time on 4/27 per sch message   Steve [name]

## 2020-08-03 NOTE — Telephone Encounter (Signed)
Called and spoke with pt's spouse [name] (on [place]) to see when pt was switched from [place] to [employer]. [name] stated pt was hospitalized March-July 2021 and after the hospital stay pt was with DME [employer].  [name] states that [employer] is charging them incorrectly and due to this, she wants to know if pt can be switched back to [facility].  Before sending this to [place], sending to [facility] to see if pt would be eligible to switch back to [place] from [place].

## 2020-08-03 NOTE — Telephone Encounter (Signed)
ATC patient's wife unable to reach LM to call back office (x1)    Patient currently on oxygen but no dme in chart. Need to know current DME and then if the [facility] can determine if patient can even switch yet, given the years of current contract with dme

## 2020-08-03 NOTE — Telephone Encounter (Signed)
I called and spoke with the pt's spouse, Steve [name]. She states that they are unable to come in for ov to qualify at this time bc [employer] is not giving them any more tanks and they are basically out. He uses 4-8 lpm and tanks do not last long. She is upset bc he does not even have enough to go to his chemo appt either. He only has the home concentrator at this time. She states that [employer] double billed them for a pulse ox and they did not pay and now they will not supply any more tanks. I have sent msg to Glory Rosebush, and Leah with [employer] to see what can be done.

## 2020-08-03 NOTE — Telephone Encounter (Signed)
Pt wife,Michell(ok per dpr) calling on behalf of pt. Pt is in need of oxygen tanks- "4 e cylinders". Currnt DME has billing issues, wanting to see if [facility] will send in order to [place] to get tanks. Pt has chemo Wednesday and doesn't have enough oxygen to get him there. Please advise [phone]

## 2020-08-03 NOTE — Telephone Encounter (Signed)
I called [facility] & spoke to Terri.  She states pt will just need to have ov and be requalified.  I asked about the 5 year plan with O2 and she states that would not be an issue for this pt - just needs to be qualified.

## 2020-08-04 NOTE — Telephone Encounter (Signed)
Received the following msg back from [employer]:  [place], [place], [place]; Darlina Guys; Jaquita Rector; [place], [employer],   Adding [place]   I sent a message to have this reviewed, both the billing issues and his current o2 setup to see what we could do to help resolve this issue. I added Rise Paganini to this message chain due to Denny Peon being out of office.   Thanks,   Brad New   I spoke with the pt's spouse. She states that she received a call from [employer] yesterday after we spoke and they are aware of the issue and they are giving the pt 12 tanks. Pt is willing to stick with [employer] now. Will call again if needed.

## 2020-08-05 ENCOUNTER — Ambulatory Visit: Payer: BC Managed Care – PPO

## 2020-08-05 ENCOUNTER — Other Ambulatory Visit: Payer: BC Managed Care – PPO

## 2020-08-05 ENCOUNTER — Inpatient Hospital Stay: Payer: BC Managed Care – PPO

## 2020-08-05 ENCOUNTER — Encounter: Payer: Self-pay | Admitting: Hematology & Oncology

## 2020-08-05 ENCOUNTER — Telehealth: Payer: Self-pay

## 2020-08-05 ENCOUNTER — Other Ambulatory Visit: Payer: Self-pay

## 2020-08-05 ENCOUNTER — Inpatient Hospital Stay (HOSPITAL_BASED_OUTPATIENT_CLINIC_OR_DEPARTMENT_OTHER): Payer: BC Managed Care – PPO | Admitting: Hematology & Oncology

## 2020-08-05 ENCOUNTER — Encounter: Payer: Self-pay | Admitting: *Deleted

## 2020-08-05 ENCOUNTER — Ambulatory Visit: Payer: BC Managed Care – PPO | Admitting: Hematology & Oncology

## 2020-08-05 VITALS — BP 117/79 | HR 99 | Temp 98.6°F | Resp 18

## 2020-08-05 DIAGNOSIS — T451X5A Adverse effect of antineoplastic and immunosuppressive drugs, initial encounter: Secondary | ICD-10-CM | POA: Diagnosis not present

## 2020-08-05 DIAGNOSIS — R5383 Other fatigue: Secondary | ICD-10-CM | POA: Diagnosis not present

## 2020-08-05 DIAGNOSIS — D649 Anemia, unspecified: Secondary | ICD-10-CM | POA: Diagnosis not present

## 2020-08-05 DIAGNOSIS — D6481 Anemia due to antineoplastic chemotherapy: Secondary | ICD-10-CM

## 2020-08-05 DIAGNOSIS — C8112 Nodular sclerosis classical Hodgkin lymphoma, intrathoracic lymph nodes: Secondary | ICD-10-CM

## 2020-08-05 DIAGNOSIS — C8102 Nodular lymphocyte predominant Hodgkin lymphoma, intrathoracic lymph nodes: Secondary | ICD-10-CM

## 2020-08-05 DIAGNOSIS — R110 Nausea: Secondary | ICD-10-CM | POA: Diagnosis not present

## 2020-08-05 DIAGNOSIS — R61 Generalized hyperhidrosis: Secondary | ICD-10-CM | POA: Diagnosis not present

## 2020-08-05 DIAGNOSIS — R062 Wheezing: Secondary | ICD-10-CM | POA: Diagnosis not present

## 2020-08-05 DIAGNOSIS — Z930 Tracheostomy status: Secondary | ICD-10-CM | POA: Diagnosis not present

## 2020-08-05 DIAGNOSIS — C8110 Nodular sclerosis classical Hodgkin lymphoma, unspecified site: Secondary | ICD-10-CM | POA: Diagnosis present

## 2020-08-05 DIAGNOSIS — Z5112 Encounter for antineoplastic immunotherapy: Secondary | ICD-10-CM | POA: Diagnosis present

## 2020-08-05 DIAGNOSIS — M7989 Other specified soft tissue disorders: Secondary | ICD-10-CM | POA: Diagnosis not present

## 2020-08-05 DIAGNOSIS — R0602 Shortness of breath: Secondary | ICD-10-CM | POA: Diagnosis not present

## 2020-08-05 LAB — CBC WITH DIFFERENTIAL (CANCER CENTER ONLY)
Abs Immature Granulocytes: 0.04 10*3/uL (ref 0.00–0.07)
Basophils Absolute: 0.1 10*3/uL (ref 0.0–0.1)
Basophils Relative: 1 %
Eosinophils Absolute: 0.2 10*3/uL (ref 0.0–0.5)
Eosinophils Relative: 2 %
HCT: 30.6 % — ABNORMAL LOW (ref 39.0–52.0)
Hemoglobin: 9.3 g/dL — ABNORMAL LOW (ref 13.0–17.0)
Immature Granulocytes: 0 %
Lymphocytes Relative: 23 %
Lymphs Abs: 2.3 10*3/uL (ref 0.7–4.0)
MCH: 27 pg (ref 26.0–34.0)
MCHC: 30.4 g/dL (ref 30.0–36.0)
MCV: 88.7 fL (ref 80.0–100.0)
Monocytes Absolute: 1.1 10*3/uL — ABNORMAL HIGH (ref 0.1–1.0)
Monocytes Relative: 11 %
Neutro Abs: 6.3 10*3/uL (ref 1.7–7.7)
Neutrophils Relative %: 63 %
Platelet Count: 227 10*3/uL (ref 150–400)
RBC: 3.45 MIL/uL — ABNORMAL LOW (ref 4.22–5.81)
RDW: 18.8 % — ABNORMAL HIGH (ref 11.5–15.5)
WBC Count: 10 10*3/uL (ref 4.0–10.5)
nRBC: 0 % (ref 0.0–0.2)

## 2020-08-05 LAB — CMP (CANCER CENTER ONLY)
ALT: 19 U/L (ref 0–44)
AST: 21 U/L (ref 15–41)
Albumin: 3.5 g/dL (ref 3.5–5.0)
Alkaline Phosphatase: 55 U/L (ref 38–126)
Anion gap: 5 (ref 5–15)
BUN: 43 mg/dL — ABNORMAL HIGH (ref 8–23)
CO2: 39 mmol/L — ABNORMAL HIGH (ref 22–32)
Calcium: 11.2 mg/dL — ABNORMAL HIGH (ref 8.9–10.3)
Chloride: 95 mmol/L — ABNORMAL LOW (ref 98–111)
Creatinine: 0.82 mg/dL (ref 0.61–1.24)
GFR, Estimated: >60 mL/min (ref >60)
Glucose, Bld: 128 mg/dL — ABNORMAL HIGH (ref 70–99)
Potassium: 4.3 mmol/L (ref 3.5–5.1)
Sodium: 139 mmol/L (ref 135–145)
Total Bilirubin: 0.4 mg/dL (ref 0.3–1.2)
Total Protein: 8.1 g/dL (ref 6.5–8.1)

## 2020-08-05 LAB — LACTATE DEHYDROGENASE: LDH: 208 U/L — ABNORMAL HIGH (ref 98–192)

## 2020-08-05 LAB — RETICULOCYTES
Immature Retic Fract: 13.7 % (ref 2.3–15.9)
RBC.: 3.37 MIL/uL — ABNORMAL LOW (ref 4.22–5.81)
Retic Count, Absolute: 48.9 10*3/uL (ref 19.0–186.0)
Retic Ct Pct: 1.5 % (ref 0.4–3.1)

## 2020-08-05 LAB — PREALBUMIN: Prealbumin: 18.4 mg/dL (ref 18–38)

## 2020-08-05 MED ORDER — ACETAMINOPHEN 325 MG PO TABS
ORAL_TABLET | ORAL | Status: AC
  Filled 2020-08-05: qty 2

## 2020-08-05 MED ORDER — SODIUM CHLORIDE 0.9% FLUSH
10.0000 mL | INTRAVENOUS | Status: DC | PRN
  Administered 2020-08-05: 10 mL
  Filled 2020-08-05: qty 10

## 2020-08-05 MED ORDER — DIPHENHYDRAMINE HCL 50 MG/ML IJ SOLN
50.0000 mg | Freq: Once | INTRAMUSCULAR | Status: AC
Start: 2020-08-05 — End: 2020-08-05
  Administered 2020-08-05: 50 mg via INTRAVENOUS

## 2020-08-05 MED ORDER — SODIUM CHLORIDE 0.9 % IV SOLN
Freq: Once | INTRAVENOUS | Status: AC
  Filled 2020-08-05: qty 250

## 2020-08-05 MED ORDER — ACETAMINOPHEN 325 MG PO TABS
650.0000 mg | ORAL_TABLET | Freq: Once | ORAL | Status: AC
  Administered 2020-08-05: 650 mg via ORAL

## 2020-08-05 MED ORDER — HEPARIN SOD (PORK) LOCK FLUSH 100 UNIT/ML IV SOLN
500.0000 [IU] | Freq: Once | INTRAVENOUS | Status: AC | PRN
  Administered 2020-08-05: 500 [IU]
  Filled 2020-08-05: qty 5

## 2020-08-05 MED ORDER — SODIUM CHLORIDE 0.9 % IV SOLN
10.0000 mg | Freq: Once | INTRAVENOUS | Status: AC
  Administered 2020-08-05: 10 mg via INTRAVENOUS
  Filled 2020-08-05: qty 10

## 2020-08-05 MED ORDER — DIPHENHYDRAMINE HCL 50 MG/ML IJ SOLN
INTRAMUSCULAR | Status: AC
  Filled 2020-08-05: qty 1

## 2020-08-05 MED ORDER — SODIUM CHLORIDE 0.9 % IV SOLN
1.8000 mg/kg | Freq: Once | INTRAVENOUS | Status: AC
  Administered 2020-08-05: 120 mg via INTRAVENOUS
  Filled 2020-08-05: qty 24

## 2020-08-05 NOTE — Telephone Encounter (Signed)
appts made and pt will gain she in chemo and through [facility]

## 2020-08-05 NOTE — Progress Notes (Signed)
Hematology and Oncology Follow Up Visit  Steve [name] [id number] December 04, 1955 64 y.o. 08/05/2020   Principle Diagnosis:   Relapsed classical Hodgkin's disease  Current Therapy:    Adcetris-s/p cycle 1 - started on 07/10/2020     Interim History:  Steve [name] is back to the office after his cycle of Adcetris.  Unfortunately, he ended up back in the hospital.  He had pneumonia.  He is very prone to pneumonia because of his lung function.  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  He looks fantastic.  He sounds good.  His wife is very happy with how well he is done.  Of note, when he was in the hospital, x-rays that were done seem to show that everything seemed to be responding with respect to his Hodgkin's.  He has had no problems with fever.  He has had no bleeding.  He is quite anemic.  I think the anemia could be affecting his pulmonary function.  We will see about his iron studies.  I will have to also see about his erythropoietin level.  He may need to have ESA to try to help get his hemoglobin higher.  He has had no problems with pain.  He has had no leg swelling.  He has had no rashes.  There has been no issues with urinary problems or diarrhea.  Overall, his performance status is ECOG 1.    Medications:  Current Outpatient Medications:  .  acetaminophen (TYLENOL) 160 MG/5ML solution, Place 20.3 mLs (650 mg total) into feeding tube every 4 (four) hours as needed for mild pain or fever (temp > 101.5). (Patient not taking: Reported on 07/21/2020), Disp: 120 mL, Rfl: 0 .  albuterol (PROVENTIL) (2.5 MG/3ML) 0.083% nebulizer solution, Take 3 mLs (2.5 mg total) by nebulization every 2 (two) hours as needed for wheezing., Disp: 75 mL, Rfl: 6 .  Amino Acids-Protein Hydrolys (FEEDING SUPPLEMENT, PRO-STAT SUGAR FREE 64,) LIQD, Place 30 mLs into feeding tube daily., Disp: , Rfl:  .  calamine lotion, Apply topically as needed for itching., Disp: 120 mL, Rfl: 0 .  chlorhexidine (PERIDEX)  0.12 % solution, 15 mLs by Mouth Rinse route 2 (two) times daily. (Patient taking differently: 15 mLs by Mouth Rinse route 2 (two) times daily as needed (mouth pain).), Disp: 120 mL, Rfl: 0 .  Chlorhexidine Gluconate Cloth 2 % PADS, Apply 6 each topically daily. (Patient not taking: No sig reported), Disp: , Rfl:  .  Cholecalciferol (VITAMIN D3) 10 MCG/ML LIQD, Give 400 Units by tube daily. (Patient taking differently: Place 400 Units into feeding tube daily.), Disp: 120 mL, Rfl:  .  furosemide (LASIX) 20 MG tablet, PLACE 1 TABLET (20 MG TOTAL) INTO FEEDING TUBE DAILY., Disp: 30 tablet, Rfl: 1 .  guaiFENesin (ROBITUSSIN) 100 MG/5ML SOLN, Place 5 mLs (100 mg total) into feeding tube every 4 (four) hours as needed for cough or to loosen phlegm., Disp: 236 mL, Rfl: 0 .  HYOSYNE 0.125 MG/ML solution, , Disp: , Rfl:  .  lidocaine-prilocaine (EMLA) cream, Apply to affected area once (Patient taking differently: Apply 1 application topically daily as needed (access port). Apply to affected area once), Disp: 30 g, Rfl: 3 .  loperamide HCl (IMODIUM) 1 MG/7.5ML suspension, Place 15 mLs (2 mg total) into feeding tube as needed for diarrhea or loose stools (maximum 16mg  per day)., Disp: 120 mL, Rfl: 0 .  LORazepam (ATIVAN) 0.5 MG tablet, Take 1 tablet (0.5 mg total) by mouth every 6 (six)  hours as needed (Nausea or vomiting)., Disp: 30 tablet, Rfl: 0 .  magnesium oxide (MAG-OX) 400 MG tablet, Take 400 mg by mouth in the morning and at bedtime., Disp: , Rfl:  .  Multiple Vitamin (MULTIVITAMIN) LIQD, Place 15 mLs into feeding tube daily., Disp:  , Rfl:  .  Nutritional Supplements (FEEDING SUPPLEMENT, OSMOLITE 1.2 CAL,) LIQD, Place 1,000 mLs into feeding tube continuous. (Patient taking differently: Place 70 mLs into feeding tube continuous.), Disp: , Rfl: 0 .  ondansetron (ZOFRAN) 4 MG tablet, Take 1 tablet (4 mg total) by mouth every 6 (six) hours as needed for nausea., Disp: 20 tablet, Rfl: 0 .  sertraline (ZOLOFT)  50 MG tablet, GIVE 1 TAB PER TUBE DAILY, Disp: 90 tablet, Rfl: 1 .  traZODone (DESYREL) 100 MG tablet, PLACE 1 TABLET (100 MG TOTAL) INTO FEEDING TUBE AT BEDTIME., Disp: 90 tablet, Rfl: 2 .  triamcinolone (KENALOG) 0.1 %, Apply 1 application topically 2 (two) times daily as needed (itching)., Disp: , Rfl:  .  Water For Irrigation, Sterile (FREE WATER) SOLN, Place 100 mLs into feeding tube every 4 (four) hours. (Patient taking differently: Place 120 mLs into feeding tube every 4 (four) hours.), Disp: , Rfl:   Allergies: No Known Allergies  Past Medical History, Surgical history, Social history, and Family History were reviewed and updated.  Review of Systems: Review of Systems  Constitutional: Positive for diaphoresis and fatigue.  HENT:  Negative.   Eyes: Negative.   Respiratory: Positive for chest tightness, shortness of breath and wheezing.   Cardiovascular: Positive for leg swelling.  Gastrointestinal: Positive for nausea.  Endocrine: Negative.   Genitourinary: Negative.    Musculoskeletal: Negative.   Skin: Negative.   Neurological: Negative.   Hematological: Negative.   Psychiatric/Behavioral: Negative.     Physical Exam:  oral temperature is 98.6 F (37 C). His blood pressure is 117/79 and his pulse is 99. His respiration is 18 and oxygen saturation is 100%.   Wt Readings from Last 3 Encounters:  07/28/20 135 lb 2.3 oz (61.3 kg)  07/15/20 133 lb 6.1 oz (60.5 kg)  05/29/20 148 lb 15.8 oz (67.6 kg)    Physical Exam Vitals reviewed.  HENT:     Head: Normocephalic and atraumatic.  Eyes:     Pupils: Pupils are equal, round, and reactive to light.  Neck:     Comments: His neck exam shows no adenopathy bilaterally.  He does have the tracheotomy intact. Cardiovascular:     Rate and Rhythm: Normal rate and regular rhythm.     Heart sounds: Normal heart sounds.     Comments: Cardiac exam shows a regular rate and rhythm.  He has a normal S1 and S2.  There are no obvious  murmurs. Pulmonary:     Effort: Pulmonary effort is normal.     Breath sounds: Normal breath sounds.     Comments: His lungs show congestion bilaterally.  He has some wheezes bilaterally.  He has decreased air movement bilaterally. Abdominal:     General: Bowel sounds are normal.     Palpations: Abdomen is soft.  Musculoskeletal:        General: No tenderness or deformity. Normal range of motion.     Cervical back: Normal range of motion.     Comments: Extremities does show some mild pitting edema in his legs bilaterally.  Lymphadenopathy:     Cervical: No cervical adenopathy.  Skin:    General: Skin is warm and dry.  Findings: No erythema or rash.  Neurological:     Mental Status: He is alert and oriented to person, place, and time.  Psychiatric:        Behavior: Behavior normal.        Thought Content: Thought content normal.        Judgment: Judgment normal.      Lab Results  Component Value Date   WBC 10.0 08/05/2020   HGB 9.3 (L) 08/05/2020   HCT 30.6 (L) 08/05/2020   MCV 88.7 08/05/2020   PLT 227 08/05/2020     Chemistry      Component Value Date/Time   NA 139 08/05/2020 1315   K 4.3 08/05/2020 1315   CL 95 (L) 08/05/2020 1315   CO2 39 (H) 08/05/2020 1315   BUN 43 (H) 08/05/2020 1315   CREATININE 0.82 08/05/2020 1315      Component Value Date/Time   CALCIUM 11.2 (H) 08/05/2020 1315   ALKPHOS 55 08/05/2020 1315   AST 21 08/05/2020 1315   ALT 19 08/05/2020 1315   BILITOT 0.4 08/05/2020 1315      Impression and Plan: Mr. Brinegar is a very nice 65 year old white male.  He has relapsed Hodgkin's disease.  He did not receive his full complement of chemotherapy initially.  This was because of medical issues not related to his Hodgkin's disease.  Clearly, he has aggressive disease.  He is not a candidate for any aggressive intervention.  He is not a candidate for stem cell transplant.  I had to believe that the Adcetris is going to help.  He looks quite  good today.  This will be his second cycle of Adcetris.  I think we should probably try to go with 3 cycles and then repeat our PET scan to see how everything looks.  We will plan to get him back in another 3 weeks for his next treatment.  Hopefully, he will not be in the hospital with pulmonary issues.  We will see what his anemia studies show.  Again we really need to get his hemoglobin a little bit higher which I think will help his pulmonary status.   Volanda Napoleon, MD 4/27/20222:21 PM

## 2020-08-05 NOTE — Patient Instructions (Signed)
Implanted Port Insertion, Care After This sheet gives you information about how to care for yourself after your procedure. Your health care provider may also give you more specific instructions. If you have problems or questions, contact your health care provider. What can I expect after the procedure? After the procedure, it is common to have:  Discomfort at the port insertion site.  Bruising on the skin over the port. This should improve over 3-4 days. Follow these instructions at home: Port care  After your port is placed, you will get a manufacturer's information card. The card has information about your port. Keep this card with you at all times.  Take care of the port as told by your health care provider. Ask your health care provider if you or a family member can get training for taking care of the port at home. A home health care nurse may also take care of the port.  Make sure to remember what type of port you have. Incision care  Follow instructions from your health care provider about how to take care of your port insertion site. Make sure you: ? Wash your hands with soap and water before and after you change your bandage (dressing). If soap and water are not available, use hand sanitizer. ? Change your dressing as told by your health care provider. ? Leave stitches (sutures), skin glue, or adhesive strips in place. These skin closures may need to stay in place for 2 weeks or longer. If adhesive strip edges start to loosen and curl up, you may trim the loose edges. Do not remove adhesive strips completely unless your health care provider tells you to do that.  Check your port insertion site every day for signs of infection. Check for: ? Redness, swelling, or pain. ? Fluid or blood. ? Warmth. ? Pus or a bad smell.      Activity  Return to your normal activities as told by your health care provider. Ask your health care provider what activities are safe for you.  Do not  lift anything that is heavier than 10 lb (4.5 kg), or the limit that you are told, until your health care provider says that it is safe. General instructions  Take over-the-counter and prescription medicines only as told by your health care provider.  Do not take baths, swim, or use a hot tub until your health care provider approves. Ask your health care provider if you may take showers. You may only be allowed to take sponge baths.  Do not drive for 24 hours if you were given a sedative during your procedure.  Wear a medical alert bracelet in case of an emergency. This will tell any health care providers that you have a port.  Keep all follow-up visits as told by your health care provider. This is important. Contact a health care provider if:  You cannot flush your port with saline as directed, or you cannot draw blood from the port.  You have a fever or chills.  You have redness, swelling, or pain around your port insertion site.  You have fluid or blood coming from your port insertion site.  Your port insertion site feels warm to the touch.  You have pus or a bad smell coming from the port insertion site. Get help right away if:  You have chest pain or shortness of breath.  You have bleeding from your port that you cannot control. Summary  Take care of the port as told by your   health care provider. Keep the manufacturer's information card with you at all times.  Change your dressing as told by your health care provider.  Contact a health care provider if you have a fever or chills or if you have redness, swelling, or pain around your port insertion site.  Keep all follow-up visits as told by your health care provider. This information is not intended to replace advice given to you by your health care provider. Make sure you discuss any questions you have with your health care provider. Document Revised: 10/24/2017 Document Reviewed: 10/24/2017 Elsevier Patient Education   2021 [employer]  

## 2020-08-05 NOTE — Progress Notes (Signed)
Visited with patient and wife prior to treatment. Patient is resting comfortably. [name] states he is doing well at home. She has been working diligently with [employer] and the Pulmonologist and believes her issues with oxygen supply have been addressed. She had 10 tanks delivered this morning.   She has no current needs or concerns but knows to reach out.  Oncology Nurse Navigator Documentation  Oncology Nurse Navigator Flowsheets 08/05/2020  Abnormal Finding Date -  Confirmed Diagnosis Date -  Diagnosis Status -  Planned Course of Treatment -  Phase of Treatment -  Chemotherapy Pending- Reason: -  Chemotherapy Actual Start Date: -  Navigator Follow Up Date: 08/26/2020  Navigator Follow Up Reason: Follow-up Appointment;Chemotherapy  Navigator Location [facility]  Referral Date to RadOnc/MedOnc -  Navigator Encounter Type Treatment  Telephone -  Treatment Initiated Date -  Patient Visit Type MedOnc  Treatment Phase Active Tx  Barriers/Navigation Needs Coordination of Care;Education;Employed;Family Concerns;Morbidities/Frailty;Multiple Hospital Admissions  Education -  Interventions Psycho-Social Support  Acuity Level 4-High Needs (Greater Than 4 Barriers Identified)  Referrals -  Coordination of Care -  Education Method Verbal  Support Groups/Services Friends and Family  Time Spent with Patient 30

## 2020-08-06 LAB — ERYTHROPOIETIN: Erythropoietin: 10.9 m[IU]/mL (ref 2.6–18.5)

## 2020-08-06 LAB — IRON AND TIBC
Iron: 19 ug/dL — ABNORMAL LOW (ref 42–163)
Saturation Ratios: 9 % — ABNORMAL LOW (ref 20–55)
TIBC: 211 ug/dL (ref 202–409)
UIBC: 193 ug/dL (ref 117–376)

## 2020-08-06 LAB — FERRITIN: Ferritin: 866 ng/mL — ABNORMAL HIGH (ref 24–336)

## 2020-08-13 ENCOUNTER — Other Ambulatory Visit: Payer: Self-pay

## 2020-08-13 ENCOUNTER — Inpatient Hospital Stay (HOSPITAL_COMMUNITY)
Admission: EM | Admit: 2020-08-13 | Discharge: 2020-08-25 | DRG: 207 | Disposition: A | Discharge status: Home Health | Payer: BC Managed Care – PPO | Attending: Internal Medicine | Admitting: Internal Medicine

## 2020-08-13 ENCOUNTER — Emergency Department (HOSPITAL_COMMUNITY): Payer: BC Managed Care – PPO

## 2020-08-13 ENCOUNTER — Encounter (HOSPITAL_COMMUNITY): Payer: Self-pay

## 2020-08-13 DIAGNOSIS — T451X5A Adverse effect of antineoplastic and immunosuppressive drugs, initial encounter: Secondary | ICD-10-CM | POA: Diagnosis present

## 2020-08-13 DIAGNOSIS — J704 Drug-induced interstitial lung disorders, unspecified: Secondary | ICD-10-CM | POA: Diagnosis present

## 2020-08-13 DIAGNOSIS — D638 Anemia in other chronic diseases classified elsewhere: Secondary | ICD-10-CM

## 2020-08-13 DIAGNOSIS — Z20822 Contact with and (suspected) exposure to covid-19: Secondary | ICD-10-CM | POA: Diagnosis present

## 2020-08-13 DIAGNOSIS — Z681 Body mass index (BMI) 19 or less, adult: Secondary | ICD-10-CM

## 2020-08-13 DIAGNOSIS — T380X5A Adverse effect of glucocorticoids and synthetic analogues, initial encounter: Secondary | ICD-10-CM | POA: Diagnosis not present

## 2020-08-13 DIAGNOSIS — E11649 Type 2 diabetes mellitus with hypoglycemia without coma: Secondary | ICD-10-CM | POA: Diagnosis not present

## 2020-08-13 DIAGNOSIS — R627 Adult failure to thrive: Secondary | ICD-10-CM | POA: Diagnosis present

## 2020-08-13 DIAGNOSIS — A419 Sepsis, unspecified organism: Secondary | ICD-10-CM | POA: Diagnosis present

## 2020-08-13 DIAGNOSIS — C8190 Hodgkin lymphoma, unspecified, unspecified site: Secondary | ICD-10-CM | POA: Diagnosis present

## 2020-08-13 DIAGNOSIS — J189 Pneumonia, unspecified organism: Secondary | ICD-10-CM | POA: Diagnosis present

## 2020-08-13 DIAGNOSIS — J8410 Pulmonary fibrosis, unspecified: Secondary | ICD-10-CM | POA: Diagnosis present

## 2020-08-13 DIAGNOSIS — E1165 Type 2 diabetes mellitus with hyperglycemia: Secondary | ICD-10-CM | POA: Diagnosis not present

## 2020-08-13 DIAGNOSIS — D72829 Elevated white blood cell count, unspecified: Secondary | ICD-10-CM

## 2020-08-13 DIAGNOSIS — K219 Gastro-esophageal reflux disease without esophagitis: Secondary | ICD-10-CM | POA: Diagnosis present

## 2020-08-13 DIAGNOSIS — U099 Post covid-19 condition, unspecified: Secondary | ICD-10-CM | POA: Diagnosis present

## 2020-08-13 DIAGNOSIS — J80 Acute respiratory distress syndrome: Secondary | ICD-10-CM | POA: Diagnosis present

## 2020-08-13 DIAGNOSIS — R0602 Shortness of breath: Secondary | ICD-10-CM

## 2020-08-13 DIAGNOSIS — I10 Essential (primary) hypertension: Secondary | ICD-10-CM | POA: Diagnosis present

## 2020-08-13 DIAGNOSIS — E860 Dehydration: Secondary | ICD-10-CM | POA: Diagnosis present

## 2020-08-13 DIAGNOSIS — G4730 Sleep apnea, unspecified: Secondary | ICD-10-CM | POA: Diagnosis present

## 2020-08-13 DIAGNOSIS — I959 Hypotension, unspecified: Secondary | ICD-10-CM | POA: Diagnosis present

## 2020-08-13 DIAGNOSIS — Z79899 Other long term (current) drug therapy: Secondary | ICD-10-CM

## 2020-08-13 DIAGNOSIS — Y95 Nosocomial condition: Secondary | ICD-10-CM | POA: Diagnosis present

## 2020-08-13 DIAGNOSIS — D630 Anemia in neoplastic disease: Secondary | ICD-10-CM | POA: Diagnosis present

## 2020-08-13 DIAGNOSIS — J9621 Acute and chronic respiratory failure with hypoxia: Secondary | ICD-10-CM | POA: Diagnosis not present

## 2020-08-13 DIAGNOSIS — Z8249 Family history of ischemic heart disease and other diseases of the circulatory system: Secondary | ICD-10-CM

## 2020-08-13 DIAGNOSIS — Z96643 Presence of artificial hip joint, bilateral: Secondary | ICD-10-CM | POA: Diagnosis present

## 2020-08-13 DIAGNOSIS — C8112 Nodular sclerosis classical Hodgkin lymphoma, intrathoracic lymph nodes: Secondary | ICD-10-CM | POA: Diagnosis not present

## 2020-08-13 DIAGNOSIS — Z931 Gastrostomy status: Secondary | ICD-10-CM | POA: Diagnosis not present

## 2020-08-13 DIAGNOSIS — E43 Unspecified severe protein-calorie malnutrition: Secondary | ICD-10-CM | POA: Diagnosis present

## 2020-08-13 DIAGNOSIS — Z930 Tracheostomy status: Secondary | ICD-10-CM

## 2020-08-13 DIAGNOSIS — Z96641 Presence of right artificial hip joint: Secondary | ICD-10-CM | POA: Diagnosis present

## 2020-08-13 LAB — EXPECTORATED SPUTUM ASSESSMENT W GRAM STAIN, RFLX TO RESP C

## 2020-08-13 LAB — CBC WITH DIFFERENTIAL/PLATELET
Abs Immature Granulocytes: 0.07 10*3/uL (ref 0.00–0.07)
Basophils Absolute: 0 10*3/uL (ref 0.0–0.1)
Basophils Relative: 0 %
Eosinophils Absolute: 0.7 10*3/uL — ABNORMAL HIGH (ref 0.0–0.5)
Eosinophils Relative: 6 %
HCT: 39.9 % (ref 39.0–52.0)
Hemoglobin: 12.2 g/dL — ABNORMAL LOW (ref 13.0–17.0)
Immature Granulocytes: 1 %
Lymphocytes Relative: 6 %
Lymphs Abs: 0.7 10*3/uL (ref 0.7–4.0)
MCH: 27.1 pg (ref 26.0–34.0)
MCHC: 30.6 g/dL (ref 30.0–36.0)
MCV: 88.5 fL (ref 80.0–100.0)
Monocytes Absolute: 1.4 10*3/uL — ABNORMAL HIGH (ref 0.1–1.0)
Monocytes Relative: 11 %
Neutro Abs: 9.2 10*3/uL — ABNORMAL HIGH (ref 1.7–7.7)
Neutrophils Relative %: 76 %
Platelets: 150 10*3/uL (ref 150–400)
RBC: 4.51 MIL/uL (ref 4.22–5.81)
RDW: 18.2 % — ABNORMAL HIGH (ref 11.5–15.5)
WBC: 12.2 10*3/uL — ABNORMAL HIGH (ref 4.0–10.5)
nRBC: 0 % (ref 0.0–0.2)

## 2020-08-13 LAB — COMPREHENSIVE METABOLIC PANEL
ALT: 21 U/L (ref 0–44)
AST: 23 U/L (ref 15–41)
Albumin: 2.5 g/dL — ABNORMAL LOW (ref 3.5–5.0)
Alkaline Phosphatase: 66 U/L (ref 38–126)
Anion gap: 6 (ref 5–15)
BUN: 23 mg/dL (ref 8–23)
CO2: 35 mmol/L — ABNORMAL HIGH (ref 22–32)
Calcium: 9.2 mg/dL (ref 8.9–10.3)
Chloride: 93 mmol/L — ABNORMAL LOW (ref 98–111)
Creatinine, Ser: 0.7 mg/dL (ref 0.61–1.24)
GFR, Estimated: >60 mL/min (ref >60)
Glucose, Bld: 123 mg/dL — ABNORMAL HIGH (ref 70–99)
Potassium: 4.3 mmol/L (ref 3.5–5.1)
Sodium: 134 mmol/L — ABNORMAL LOW (ref 135–145)
Total Bilirubin: 0.7 mg/dL (ref 0.3–1.2)
Total Protein: 7.4 g/dL (ref 6.5–8.1)

## 2020-08-13 LAB — BLOOD GAS, ARTERIAL
Acid-Base Excess: 7.4 mmol/L — ABNORMAL HIGH (ref 0.0–2.0)
Bicarbonate: 32.8 mmol/L — ABNORMAL HIGH (ref 20.0–28.0)
Drawn by: 38235
FIO2: 60
O2 Saturation: 96.2 %
Patient temperature: 37
pCO2 arterial: 59.8 mmHg — ABNORMAL HIGH (ref 32.0–48.0)
pH, Arterial: 7.359 (ref 7.350–7.450)
pO2, Arterial: 85.4 mmHg (ref 83.0–108.0)

## 2020-08-13 LAB — URINALYSIS, ROUTINE W REFLEX MICROSCOPIC
Bilirubin Urine: NEGATIVE
Glucose, UA: NEGATIVE mg/dL
Hgb urine dipstick: NEGATIVE
Ketones, ur: NEGATIVE mg/dL
Nitrite: NEGATIVE
Protein, ur: NEGATIVE mg/dL
Specific Gravity, Urine: 1.015 (ref 1.005–1.030)
pH: 6 (ref 5.0–8.0)

## 2020-08-13 LAB — RESP PANEL BY RT-PCR (FLU A&B, COVID) ARPGX2
Influenza A by PCR: NEGATIVE
Influenza B by PCR: NEGATIVE
SARS Coronavirus 2 by RT PCR: NEGATIVE

## 2020-08-13 LAB — LACTIC ACID, PLASMA
Lactic Acid, Venous: 1.1 mmol/L (ref 0.5–1.9)
Lactic Acid, Venous: 0.9 mmol/L (ref 0.5–1.9)

## 2020-08-13 LAB — GLUCOSE, CAPILLARY: Glucose-Capillary: 147 mg/dL — ABNORMAL HIGH (ref 70–99)

## 2020-08-13 LAB — TROPONIN I (HIGH SENSITIVITY)
Troponin I (High Sensitivity): 11 ng/L (ref <18)
Troponin I (High Sensitivity): 9 ng/L (ref <18)

## 2020-08-13 LAB — PROTIME-INR
INR: 1.2 (ref 0.8–1.2)
Prothrombin Time: 15.3 seconds — ABNORMAL HIGH (ref 11.4–15.2)

## 2020-08-13 LAB — PROCALCITONIN: Procalcitonin: 0.37 ng/mL

## 2020-08-13 MED ORDER — LOPERAMIDE HCL 1 MG/7.5ML PO SUSP
2.0000 mg | Freq: Every day | ORAL | Status: DC | PRN
  Filled 2020-08-13: qty 15

## 2020-08-13 MED ORDER — PRO-STAT SUGAR FREE PO LIQD
30.0000 mL | Freq: Every day | ORAL | Status: DC
  Filled 2020-08-13: qty 30

## 2020-08-13 MED ORDER — SODIUM CHLORIDE 0.9 % IV SOLN
2.0000 g | INTRAVENOUS | Status: DC
  Administered 2020-08-13 – 2020-08-15 (×3): 2 g via INTRAVENOUS
  Filled 2020-08-13 (×3): qty 20

## 2020-08-13 MED ORDER — SODIUM CHLORIDE 0.9 % IV SOLN
2.0000 g | Freq: Once | INTRAVENOUS | Status: AC
  Administered 2020-08-13: 2 g via INTRAVENOUS
  Filled 2020-08-13: qty 2

## 2020-08-13 MED ORDER — GUAIFENESIN 100 MG/5ML PO SOLN
5.0000 mL | ORAL | Status: DC | PRN
  Administered 2020-08-14 – 2020-08-15 (×2): 100 mg
  Filled 2020-08-13 (×3): qty 5

## 2020-08-13 MED ORDER — SODIUM CHLORIDE 0.9 % IV SOLN: 2.0000 g | Freq: Three times a day (TID) | INTRAVENOUS | Status: DC

## 2020-08-13 MED ORDER — TRAZODONE HCL 100 MG PO TABS
100.0000 mg | ORAL_TABLET | Freq: Every day | ORAL | Status: DC
  Administered 2020-08-13 – 2020-08-24 (×12): 100 mg
  Filled 2020-08-13: qty 1
  Filled 2020-08-13: qty 2
  Filled 2020-08-13: qty 1
  Filled 2020-08-13 (×7): qty 2
  Filled 2020-08-13: qty 1
  Filled 2020-08-13: qty 2

## 2020-08-13 MED ORDER — LORAZEPAM 0.5 MG PO TABS
0.5000 mg | ORAL_TABLET | Freq: Four times a day (QID) | ORAL | Status: DC | PRN
  Administered 2020-08-16: 0.5 mg via ORAL
  Filled 2020-08-13: qty 1

## 2020-08-13 MED ORDER — LIDOCAINE-PRILOCAINE 2.5-2.5 % EX CREA
1.0000 "application " | TOPICAL_CREAM | Freq: Every day | CUTANEOUS | Status: DC | PRN
  Filled 2020-08-13: qty 5

## 2020-08-13 MED ORDER — ALBUTEROL SULFATE (2.5 MG/3ML) 0.083% IN NEBU
2.5000 mg | INHALATION_SOLUTION | RESPIRATORY_TRACT | Status: DC | PRN
  Administered 2020-08-16: 2.5 mg via RESPIRATORY_TRACT
  Filled 2020-08-13: qty 3

## 2020-08-13 MED ORDER — IPRATROPIUM-ALBUTEROL 0.5-2.5 (3) MG/3ML IN SOLN
3.0000 mL | Freq: Four times a day (QID) | RESPIRATORY_TRACT | Status: DC
  Administered 2020-08-13 – 2020-08-14 (×2): 3 mL via RESPIRATORY_TRACT
  Filled 2020-08-13 (×2): qty 3

## 2020-08-13 MED ORDER — ALBUTEROL SULFATE (2.5 MG/3ML) 0.083% IN NEBU: 2.5000 mg | INHALATION_SOLUTION | Freq: Four times a day (QID) | RESPIRATORY_TRACT | Status: DC

## 2020-08-13 MED ORDER — HYOSCYAMINE SULFATE 0.125 MG PO TBDP
1.0000 mg | ORAL_TABLET | Freq: Every day | ORAL | Status: DC
  Filled 2020-08-13 (×3): qty 8

## 2020-08-13 MED ORDER — METHYLPREDNISOLONE SODIUM SUCC 125 MG IJ SOLR
125.0000 mg | Freq: Three times a day (TID) | INTRAMUSCULAR | Status: DC
  Administered 2020-08-13 – 2020-08-14 (×2): 125 mg via INTRAVENOUS
  Filled 2020-08-13 (×2): qty 2

## 2020-08-13 MED ORDER — CHLORHEXIDINE GLUCONATE 0.12 % MT SOLN
15.0000 mL | Freq: Two times a day (BID) | OROMUCOSAL | Status: DC | PRN
  Filled 2020-08-13: qty 15

## 2020-08-13 MED ORDER — OSMOLITE 1.2 CAL PO LIQD
1000.0000 mL | ORAL | Status: DC
  Administered 2020-08-14: 1000 mL
  Filled 2020-08-13: qty 1000

## 2020-08-13 MED ORDER — VANCOMYCIN HCL 1000 MG/200ML IV SOLN
1000.0000 mg | Freq: Once | INTRAVENOUS | Status: AC
  Administered 2020-08-13: 1000 mg via INTRAVENOUS
  Filled 2020-08-13: qty 200

## 2020-08-13 MED ORDER — TRIAMCINOLONE ACETONIDE 0.1 % EX CREA
1.0000 "application " | TOPICAL_CREAM | Freq: Two times a day (BID) | CUTANEOUS | Status: DC | PRN
  Filled 2020-08-13: qty 15

## 2020-08-13 MED ORDER — LACTATED RINGERS IV BOLUS (SEPSIS)
1000.0000 mL | Freq: Once | INTRAVENOUS | Status: AC
  Administered 2020-08-13: 1000 mL via INTRAVENOUS

## 2020-08-13 MED ORDER — LACTATED RINGERS IV SOLN: INTRAVENOUS | Status: AC

## 2020-08-13 MED ORDER — ADULT MULTIVITAMIN LIQUID CH
15.0000 mL | Freq: Every day | ORAL | Status: DC
  Administered 2020-08-13 – 2020-08-25 (×13): 15 mL
  Filled 2020-08-13 (×14): qty 15

## 2020-08-13 MED ORDER — SODIUM CHLORIDE 0.9 % IV SOLN
500.0000 mg | INTRAVENOUS | Status: DC
  Administered 2020-08-14 – 2020-08-15 (×2): 500 mg via INTRAVENOUS
  Filled 2020-08-13 (×3): qty 500

## 2020-08-13 MED ORDER — OSMOLITE 1.2 CAL PO LIQD
70.0000 mL | ORAL | Status: DC
  Filled 2020-08-13 (×2): qty 237

## 2020-08-13 MED ORDER — MAGNESIUM OXIDE -MG SUPPLEMENT 400 (240 MG) MG PO TABS
400.0000 mg | ORAL_TABLET | Freq: Two times a day (BID) | ORAL | Status: DC
  Administered 2020-08-13 – 2020-08-16 (×6): 400 mg via ORAL
  Filled 2020-08-13 (×9): qty 1

## 2020-08-13 MED ORDER — CALAMINE EX LOTN
1.0000 "application " | TOPICAL_LOTION | Freq: Every day | CUTANEOUS | Status: DC | PRN
  Filled 2020-08-13: qty 177

## 2020-08-13 MED ORDER — VANCOMYCIN HCL 750 MG/150ML IV SOLN
750.0000 mg | Freq: Two times a day (BID) | INTRAVENOUS | Status: DC
  Administered 2020-08-14: 750 mg via INTRAVENOUS
  Filled 2020-08-13 (×3): qty 150

## 2020-08-13 MED ORDER — PROSOURCE TF PO LIQD
45.0000 mL | Freq: Every day | ORAL | Status: DC
  Administered 2020-08-13 – 2020-08-17 (×5): 45 mL
  Filled 2020-08-13 (×5): qty 45

## 2020-08-13 MED ORDER — SERTRALINE HCL 50 MG PO TABS
50.0000 mg | ORAL_TABLET | Freq: Every day | ORAL | Status: DC
  Administered 2020-08-13 – 2020-08-16 (×4): 50 mg via ORAL
  Filled 2020-08-13 (×5): qty 1

## 2020-08-13 MED ORDER — ENOXAPARIN SODIUM 40 MG/0.4ML IJ SOSY
40.0000 mg | PREFILLED_SYRINGE | INTRAMUSCULAR | Status: DC
  Administered 2020-08-13: 40 mg via SUBCUTANEOUS
  Filled 2020-08-13: qty 0.4

## 2020-08-13 NOTE — ED Notes (Signed)
Report called. RN denied questions or concerns.

## 2020-08-13 NOTE — ED Triage Notes (Signed)
Pt from home BIB EMS fore c/o SHOB and green sputum for 3-4 days. Pt has trach and used 6-10L oxygen at home. Pt arrived on 8L and became hypoxic after transferring to bed. Pt SpO2 returned to baseline after suctioning trach. Hx: PNA, COVID (1 yr ago), and right lung collapse. Recently discharge from hospital 2 weeks ago.

## 2020-08-13 NOTE — ED Notes (Signed)
Extension given to Network engineer on 5W for report.

## 2020-08-13 NOTE — ED Notes (Signed)
Pt given a urinal and notified need for a sample.

## 2020-08-13 NOTE — ED Provider Notes (Signed)
[facility] EMERGENCY DEPARTMENT Provider Note   CSN: [id number] Arrival date & time: 08/13/20  1149     History Chief Complaint  Patient presents with  . Shortness of Breath    Steve [name] is a 65 y.o. male.  Presented to ER with concern for shortness of breath, cough.  Patient reports has been feeling unwell for the last few days, increased productive cough.  Yellow/green sputum.  No blood.  Has chronic trach, uses trach collar.  Uses 6 to 10 L oxygen at home.  No chest pain or difficulty in breathing, no abdominal pain.  No burning with urination.  History somewhat limited due to his trachea but he is able to speak in short phrases and provide answers to basic questions.  HPI     Past Medical History:  Diagnosis Date  . Abscess of muscle 08/10/2011   staph infection of right hip   . Acute on chronic respiratory failure with hypoxia ([name])   . Acute respiratory distress syndrome (ARDS) due to COVID-19 virus ([place])   . Anxiety   . Diabetes mellitus without complication ([place])   . GERD (gastroesophageal reflux disease)    rare reflux - no meds for reflux - NO PROBLEM IN PAST SEVERAL YRS  . HCAP (healthcare-associated pneumonia)   . Hip dysplasia, congenital    no surgery as a child for hip dysplasia - has had bilateral hip replacements as an adult  . Hodgkin lymphoma ([place])   . Hypertension   . Pancreatitis   . Pneumothorax, acute   . Postoperative anemia due to acute blood loss 09/07/2012  . Septic arthritis of hip ([place]) 09/05/2012   PT'S TOTAL HIP JOINT REMOVED - ANTIBIOTIC SPACE PLACED AND PT HAS FINISHED IV ANTIBIOTICS ( PICC LINE REMOVED)  . Sleep apnea    USES CPAP    Patient Active Problem List   Diagnosis Date Noted  . Acute on chronic respiratory failure with hypoxia and hypercapnia ([place]) 07/21/2020  . DM (diabetes mellitus), type 2 ([place]) 07/15/2020  . Pneumonia 07/14/2020  . Pulmonary fibrosis ([place]) 05/26/2020  . Bronchiectasis ([place])  05/26/2020  . Acute hypercapnic respiratory failure ([place]) 05/26/2020  . Acute on chronic respiratory failure with hypoxia ([place]) 05/25/2020  . Uncontrolled type 2 diabetes mellitus with hyperglycemia, with long-term current use of insulin ([place]) 05/25/2020  . Essential hypertension 05/25/2020  . GERD without esophagitis 05/25/2020  . Debility   . Anxiety   . Tracheostomy care ([place])   . Pneumothorax on right 03/23/2020  . Chronic respiratory failure with hypoxia ([place])   . ARDS (adult respiratory distress syndrome) ([place])   . Hodgkin's lymphoma ([place])   . Pneumothorax, acute   . Healthcare-associated pneumonia   . COVID-19 virus infection   . On mechanically assisted ventilation ([place])   . Dysphagia   . Pneumothorax   . Subcutaneous air ([place])   . Tracheostomy dependence ([place])   . Pneumomediastinum ([place])   . Acute respiratory distress syndrome (ARDS) due to COVID-19 virus ([place])   . Pressure injury of skin 07/27/2019  . Acute respiratory failure with hypoxemia ([place])   . 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 Malnutrition of moderate degree 07/10/2019  . HCAP (healthcare-associated pneumonia)   . Hypoxia   . Febrile neutropenia ([place]) 05/17/2019  . COVID-19 05/17/2019  . Anemia 05/17/2019  . Hyponatremia 05/17/2019  . Protein-calorie malnutrition, severe ([place]) 05/17/2019  . Neutropenic fever ([place]) 05/16/2019  . Chemotherapy-induced neuropathy ([place])  04/22/2019  . Chemotherapy-induced diarrhea 04/22/2019  . Anemia due to antineoplastic chemotherapy 03/25/2019  . Hodgkin's lymphoma ([place]) 02/25/2019  . Abnormal CT scan, pancreas - tail area 01/31/2013  . Prosthetic joint infection ([place]) 09/20/2012  . Septic arthritis of hip ([place]) 09/05/2012  . Routine general medical examination at a health care facility 09/12/2011  . Elbow pain 09/12/2011  . Abscess of muscle 08/10/2011  . H/O dental abscess 08/10/2011  . Staphylococcus aureus bacteremia 07/27/2011  .  Abdominal pain, RLQ 09/02/2010  . HYPOGONADISM, MALE 03/23/2007  . SLEEP APNEA 03/23/2007    Past Surgical History:  Procedure Laterality Date  . COLONOSCOPY  04/26/2007  . HERNIA REPAIR     inguinal hernia x3  . IR GASTROSTOMY TUBE MOD SED  08/22/2019  . IR IMAGING GUIDED PORT INSERTION  03/29/2019  . JOINT REPLACEMENT  2002 & 2007   bilateral hip replacement  . LYMPH NODE BIOPSY Right 03/20/2019   Procedure: DEEP RIGHT SUPRACLAVICULAR LYMPH NODE EXCISION;  Surgeon: Fanny Skates, MD;  Location: [facility];  Service: General;  Laterality: Right;  . MULTIPLE EXTRACTIONS WITH ALVEOLOPLASTY  07/28/2011   Procedure: MULTIPLE EXTRACION WITH ALVEOLOPLASTY;  Surgeon: Lenn Cal, DDS;  Location: [facility];  Service: Oral Surgery;  Laterality: N/A;  Extraction of tooth #'s 2,3,4,5,6,11,12,13,15,19,22 with alveoloplasty.  . shoulder repair - right for separation of shoulder    . TEE WITHOUT CARDIOVERSION  07/29/2011   Procedure: TRANSESOPHAGEAL ECHOCARDIOGRAM (TEE);  Surgeon: Josue Hector, MD;  Location: [facility];  Service: Cardiovascular;  Laterality: N/A;  . TOTAL HIP REVISION Right 09/05/2012   Procedure: RIGHT HIP RESECTION ARTHROPLASTY WITH ANTIBIOTIC SPACERS;  Surgeon: [name], MD;  Location: [facility];  Service: Orthopedics;  Laterality: Right;  . TOTAL HIP REVISION Right 11/30/2012   Procedure: RIGHT TOTAL HIP ARTHROPLASTY REIMPLANTATION;  Surgeon: [name], MD;  Location: [facility];  Service: Orthopedics;  Laterality: Right;       Family History  Problem Relation Age of Onset  . Melanoma Mother   . Heart attack Mother   . Hyperlipidemia Neg Hx   . Sudden death Neg Hx   . Hypertension Neg Hx   . Diabetes Neg Hx     Social History   Tobacco Use  . Smoking status: Never Smoker  . Smokeless tobacco: Never Used  Vaping Use  . Vaping Use: Never used  Substance Use Topics  . Alcohol use: Yes    Comment: rare beer. 5 times a year at most. 2 beers  when he drinks.  . Drug use: No    Home Medications Prior to Admission medications   Medication Sig Start Date End Date Taking? Authorizing Provider  acetaminophen (TYLENOL) 160 MG/5ML solution Place 20.3 mLs (650 mg total) into feeding tube every 4 (four) hours as needed for mild pain or fever (temp > 101.5). Patient not taking: Reported on 07/21/2020 08/27/19   Donita Brooks, NP  albuterol (PROVENTIL) (2.5 MG/3ML) 0.083% nebulizer solution Take 3 mLs (2.5 mg total) by nebulization every 2 (two) hours as needed for wheezing. 07/28/20   Saguier, Percell Miller, PA-C  Amino Acids-Protein Hydrolys (FEEDING SUPPLEMENT, PRO-STAT SUGAR FREE 64,) LIQD Place 30 mLs into feeding tube daily.    [provider]  calamine lotion Apply topically as needed for itching. 07/18/20   Pokhrel, Corrie Mckusick, MD  chlorhexidine (PERIDEX) 0.12 % solution 15 mLs by Mouth Rinse route 2 (two) times daily. Patient taking differently: 15 mLs by Mouth Rinse route  2 (two) times daily as needed (mouth pain). 04/17/20   Florencia Reasons, MD  Chlorhexidine Gluconate Cloth 2 % PADS Apply 6 each topically daily. Patient not taking: No sig reported 04/18/20   Florencia Reasons, MD  Cholecalciferol (VITAMIN D3) 10 MCG/ML LIQD Give 400 Units by tube daily. Patient taking differently: Place 400 Units into feeding tube daily. 04/17/20   Florencia Reasons, MD  furosemide (LASIX) 20 MG tablet PLACE 1 TABLET (20 MG TOTAL) INTO FEEDING TUBE DAILY. 08/03/20   Saguier, Percell Miller, PA-C  guaiFENesin (ROBITUSSIN) 100 MG/5ML SOLN Place 5 mLs (100 mg total) into feeding tube every 4 (four) hours as needed for cough or to loosen phlegm. 07/18/20   Pokhrel, Corrie Mckusick, MD  HYOSYNE 0.125 MG/ML solution  07/20/20   [provider]  lidocaine-prilocaine (EMLA) cream Apply to affected area once Patient taking differently: Apply 1 application topically daily as needed (access port). Apply to affected area once 07/09/20   Volanda Napoleon, MD  loperamide HCl (IMODIUM) 1 MG/7.5ML suspension  Place 15 mLs (2 mg total) into feeding tube as needed for diarrhea or loose stools (maximum 16mg  per day). 07/20/20   Pokhrel, Corrie Mckusick, MD  LORazepam (ATIVAN) 0.5 MG tablet Take 1 tablet (0.5 mg total) by mouth every 6 (six) hours as needed (Nausea or vomiting). 07/09/20   Volanda Napoleon, MD  magnesium oxide (MAG-OX) 400 MG tablet Take 400 mg by mouth in the morning and at bedtime. 07/18/20   [provider]  Multiple Vitamin (MULTIVITAMIN) LIQD Place 15 mLs into feeding tube daily. 08/28/19   Donita Brooks, NP  Nutritional Supplements (FEEDING SUPPLEMENT, OSMOLITE 1.2 CAL,) LIQD Place 1,000 mLs into feeding tube continuous. Patient taking differently: Place 70 mLs into feeding tube continuous. 04/17/20   Florencia Reasons, MD  ondansetron (ZOFRAN) 4 MG tablet Take 1 tablet (4 mg total) by mouth every 6 (six) hours as needed for nausea. 07/18/20   Pokhrel, Corrie Mckusick, MD  sertraline (ZOLOFT) 50 MG tablet GIVE 1 TAB PER TUBE DAILY 07/07/20   Saguier, Percell Miller, PA-C  traZODone (DESYREL) 100 MG tablet PLACE 1 TABLET (100 MG TOTAL) INTO FEEDING TUBE AT BEDTIME. 08/03/20   Saguier, Percell Miller, PA-C  triamcinolone (KENALOG) 0.1 % Apply 1 application topically 2 (two) times daily as needed (itching).    [provider]  Water For Irrigation, Sterile (FREE WATER) SOLN Place 100 mLs into feeding tube every 4 (four) hours. Patient taking differently: Place 120 mLs into feeding tube every 4 (four) hours. 04/17/20   Florencia Reasons, MD    Allergies    Patient has no known allergies.  Review of Systems   Review of Systems  Constitutional: Positive for chills, fatigue and fever.  HENT: Negative for ear pain and sore throat.   Eyes: Negative for pain and visual disturbance.  Respiratory: Positive for cough and shortness of breath.   Cardiovascular: Negative for chest pain and palpitations.  Gastrointestinal: Negative for abdominal pain and vomiting.  Genitourinary: Negative for dysuria and hematuria.  Musculoskeletal:  Negative for arthralgias and back pain.  Skin: Negative for color change and rash.  Neurological: Negative for seizures and syncope.  All other systems reviewed and are negative.   Physical Exam Updated Vital Signs BP 116/68   Pulse (!) 118   Temp 98.5 F (36.9 C) (Oral)   Resp (!) 35   Ht 5' 6.5" (1.689 m)   Wt 61.2 kg   SpO2 94%   BMI 21.46 kg/m   Physical Exam Vitals and  nursing note reviewed.  Constitutional:      Appearance: He is well-developed.  HENT:     Head: Normocephalic and atraumatic.  Eyes:     Conjunctiva/sclera: Conjunctivae normal.  Cardiovascular:     Rate and Rhythm: Regular rhythm. Tachycardia present.     Heart sounds: No murmur heard.   Pulmonary:     Comments: Somewhat tachypneic, coarse breath sounds bilaterally, 10 L trach collar Abdominal:     Palpations: Abdomen is soft.     Tenderness: There is no abdominal tenderness.  Musculoskeletal:     Cervical back: Neck supple.     Right lower leg: No edema.     Left lower leg: No edema.  Skin:    General: Skin is warm and dry.  Neurological:     General: No focal deficit present.     Mental Status: He is alert.  Psychiatric:        Mood and Affect: Mood normal.     ED Results / Procedures / Treatments   Labs (all labs ordered are listed, but only abnormal results are displayed) Labs Reviewed  COMPREHENSIVE METABOLIC PANEL - Abnormal; Notable for the following components:      Result Value   Sodium 134 (*)    Chloride 93 (*)    CO2 35 (*)    Glucose, Bld 123 (*)    Albumin 2.5 (*)    All other components within normal limits  CBC WITH DIFFERENTIAL/PLATELET - Abnormal; Notable for the following components:   WBC 12.2 (*)    Hemoglobin 12.2 (*)    RDW 18.2 (*)    Neutro Abs 9.2 (*)    Monocytes Absolute 1.4 (*)    Eosinophils Absolute 0.7 (*)    All other components within normal limits  PROTIME-INR - Abnormal; Notable for the following components:   Prothrombin Time 15.3 (*)     All other components within normal limits  RESP PANEL BY RT-PCR (FLU A&B, COVID) ARPGX2  CULTURE, BLOOD (ROUTINE X 2)  CULTURE, BLOOD (ROUTINE X 2)  URINE CULTURE  EXPECTORATED SPUTUM ASSESSMENT W GRAM STAIN, RFLX TO RESP C  LACTIC ACID, PLASMA  LACTIC ACID, PLASMA  URINALYSIS, ROUTINE W REFLEX MICROSCOPIC  TROPONIN I (HIGH SENSITIVITY)  TROPONIN I (HIGH SENSITIVITY)    EKG EKG Interpretation  Date/Time:  Thursday Aug 13 2020 12:40:26 EDT Ventricular Rate:  121 PR Interval:  147 QRS Duration: 79 QT Interval:  308 QTC Calculation: 437 R Axis:   0 Text Interpretation: Sinus tachycardia Multiple premature complexes, vent & supraven Probable left atrial enlargement Indeterminate axis Low voltage, precordial leads Confirmed by Madalyn Rob [id number]) on 08/13/2020 2:08:54 PM   Radiology DG Chest Port 1 View  Result Date: 08/13/2020 CLINICAL DATA:  Shortness of breath and productive cough for 3-4 days. EXAM: PORTABLE CHEST 1 VIEW COMPARISON:  Single-view of the chest 07/21/2020, 05/25/2020 and 04/07/2020. FINDINGS: Tracheostomy tube and Port-A-Cath are again seen. Coarse pulmonary opacities in the lung bases and the periphery of the chest bilaterally again seen. No definite new airspace disease. No pneumothorax or pleural effusion. Marked cardiomegaly. Bulky lymphadenopathy is better seen on the prior chest CT. IMPRESSION: Chronic lung disease without definite acute process. Cardiomegaly. Lymphadenopathy is seen on prior CT. Electronically Signed   By: Inge Rise M.D.   On: 08/13/2020 12:59    Procedures Procedures   Medications Ordered in ED Medications  lactated ringers infusion ( Intravenous New Bag/Given 08/13/20 1444)  vancomycin (VANCOREADY) IVPB 750 mg/150 mL (  has no administration in time range)  ceFEPIme (MAXIPIME) 2 g in sodium chloride 0.9 % 100 mL IVPB (has no administration in time range)  vancomycin (VANCOREADY) IVPB 1000 mg/200 mL (0 mg Intravenous Stopped 08/13/20  1445)  ceFEPIme (MAXIPIME) 2 g in sodium chloride 0.9 % 100 mL IVPB (0 g Intravenous Stopped 08/13/20 1332)  lactated ringers bolus 1,000 mL (0 mLs Intravenous Stopped 08/13/20 1443)    ED Course  I have reviewed the triage vital signs and the nursing notes.  Pertinent labs & imaging results that were available during my care of the patient were reviewed by me and considered in my medical decision making (see chart for details).    MDM Rules/Calculators/A&P                         65 year old male presenting to ER with concern for a cough, increased sputum production.  Trach patient, chronically on trach collar.  Afebrile here but mildly tachycardic, coarse breath sounds noted bilaterally.  Concern for possible pneumonia, started broad-spectrum antibiotics and fluids.  Obtain broad infectious work-up.  Patient is completely fine CXR without definite acute process.  Given his productive sputum, reportedly low-grade temperature at home, believe patient would benefit for treatment of pneumonia.   Will consult [facility] for admit.  Final Clinical Impression(s) / ED Diagnoses Final diagnoses:  Community acquired pneumonia, unspecified laterality  Leukocytosis, unspecified type    Rx / DC Orders ED Discharge Orders    None       Lucrezia Starch, MD 08/13/20 1527

## 2020-08-13 NOTE — Progress Notes (Signed)
Pharmacy Antibiotic Note  Steve [name] is a 65 y.o. male with chronic trach who presents on  08/13/2020 with concern for PNA. Pharmacy has been consulted for Vancomycin and Cefepime dosing.  Recent weight from April ~61.2 kg, SCr 0.7, CrCl~80 ml/min.   Plan: - Vancomycin 1g IV x 1 followed by 750 mg IV every 12 hours (eAUC 450, Vd 0.72, SCr 0.8) - Cefepime 2g IV x 1 followed by 2g IV every 8 hours - Will continue to follow renal function, culture results, LOT, and antibiotic de-escalation plans   Height: 5' 6.5" (168.9 cm) Weight: 61.2 kg (135 lb) IBW/kg (Calculated) : 64.95  Temp (24hrs), Avg:98.5 F (36.9 C), Min:98.5 F (36.9 C), Max:98.5 F (36.9 C)  No results for input(s): WBC, CREATININE, LATICACIDVEN, VANCOTROUGH, VANCOPEAK, VANCORANDOM, GENTTROUGH, GENTPEAK, GENTRANDOM, TOBRATROUGH, TOBRAPEAK, TOBRARND, AMIKACINPEAK, AMIKACINTROU, AMIKACIN in the last 168 hours.  Estimated Creatinine Clearance: 78.8 mL/min (by C-G formula based on SCr of 0.82 mg/dL).    No Known Allergies  Antimicrobials this admission: Vanc 5/5 >> Cefepime 5/5 >>  Dose adjustments this admission: n/a  Microbiology results: 5/5 UCx >> 5/5 BCx >> 5/5 Fluvid >> 5/5 RCx >>  Thank you for allowing pharmacy to be a part of this patient's care.  Alycia Rossetti, PharmD, BCPS Clinical Pharmacist Clinical phone for 08/13/2020: [phone] 08/13/2020 12:41 PM   **Pharmacist phone directory can now be found on [email or web address] ([email or web address]).  Listed under [place].

## 2020-08-13 NOTE — ED Notes (Signed)
Attempted to call report. RN unavailable 

## 2020-08-13 NOTE — Progress Notes (Signed)
Patient from home with #6 cuffless shiley trach in place. Upon my arrival pt breathing labored & requesting a new inner cannula. Unable to get one quickly so I cleaned his current cannula which was very occluded with secretions. Pt suctioned & sputum sample sent to lab per MD. Placed on 60% ATC. Will continue to monitor.

## 2020-08-13 NOTE — H&P (Signed)
History and Physical    Steve [name]:[id number] DOB: [birth year] DOA: 08/13/2020  Referring MD/NP/PA: Madalyn Rob, MD PCP: [name], PA-C  Patient coming from: Home via EMS  Chief Complaint: Shortness of breath  I have personally briefly reviewed patient's old medical records in [place]   HPI: Steve [name] is a 65 y.o. male with medical history significant of chronic tracheostomy after prolonged hospitalization in 2021 related with pneumonia due to NFAOZ-30 complicated by ARDS and pulmonary fibrosis +/-pneumonitis from brentuximab, s/p PEG, HTN, DM type II, Hodgkin's lymphoma on chemotherapy, and GERD presents with complaints of shortness of breath over the last 3 to 4 days.  At baseline patient reports that he is on 8 L of oxygen at home, but had been noticing that his oxygenation would drop into the 70s.  Patient reports that his trach cannula keeps getting stopped up with phlegm.  Associated symptoms include fever up to 100.4 F at home, malaise, and wheezing.  He had been eating regular foods unlawful in addition to his tube feeds, but he stopped eating food by mouth 3 days ago as he felt that it was worsening symptoms.  He had just been recently hospitalized from 4/12-4/19 for acute hypercapnic respiratory failure  ED Course: Upon admission into the emergency department patient was seen to be afebrile, pulse 1 15-1 39, respirations 21-42, blood pressures 104/66-135/69, and O2 saturations as low as 84% with improvement on 10 L of high flow nasal cannula oxygen.  Labs significant for CO2 12.2, hemoglobin 12.2, sodium 134, CO2 35, albumin 2.5, high-sensitivity troponin negative x2, and lactic acid within normal limits.  Chest x-ray showed chronic lung disease without definitive acute process, cardiomegaly, lymphadenopathy.  Urinalysis was positive for small leukocytes, rare bacteria, and 11-20 WBCs patient was given 1 L normal saline IV fluids, vancomycin and  cefepime.  Review of Systems  Constitutional: Positive for fever and malaise/fatigue.  Respiratory: Positive for sputum production, shortness of breath and wheezing.     Past Medical History:  Diagnosis Date  . Abscess of muscle 08/10/2011   staph infection of right hip   . Acute on chronic respiratory failure with hypoxia ([place])   . Acute respiratory distress syndrome (ARDS) due to COVID-19 virus ([place])   . Anxiety   . Diabetes mellitus without complication ([place])   . GERD (gastroesophageal reflux disease)    rare reflux - no meds for reflux - NO PROBLEM IN PAST SEVERAL YRS  . HCAP (healthcare-associated pneumonia)   . Hip dysplasia, congenital    no surgery as a child for hip dysplasia - has had bilateral hip replacements as an adult  . Hodgkin lymphoma ([place])   . Hypertension   . Pancreatitis   . Pneumothorax, acute   . Postoperative anemia due to acute blood loss 09/07/2012  . Septic arthritis of hip ([place]) 09/05/2012   PT'S TOTAL HIP JOINT REMOVED - ANTIBIOTIC SPACE PLACED AND PT HAS FINISHED IV ANTIBIOTICS ( PICC LINE REMOVED)  . Sleep apnea    USES CPAP    Past Surgical History:  Procedure Laterality Date  . COLONOSCOPY  04/26/2007  . HERNIA REPAIR     inguinal hernia x3  . IR GASTROSTOMY TUBE MOD SED  08/22/2019  . IR IMAGING GUIDED PORT INSERTION  03/29/2019  . JOINT REPLACEMENT  2002 & 2007   bilateral hip replacement  . LYMPH NODE BIOPSY Right 03/20/2019   Procedure: DEEP RIGHT SUPRACLAVICULAR LYMPH NODE EXCISION;  Surgeon: Fanny Skates, MD;  Location: [facility];  Service: General;  Laterality: Right;  . MULTIPLE EXTRACTIONS WITH ALVEOLOPLASTY  07/28/2011   Procedure: MULTIPLE EXTRACION WITH ALVEOLOPLASTY;  Surgeon: Lenn Cal, DDS;  Location: [facility];  Service: Oral Surgery;  Laterality: N/A;  Extraction of tooth #'s 2,3,4,5,6,11,12,13,15,19,22 with alveoloplasty.  . shoulder repair - right for separation of shoulder    . TEE WITHOUT CARDIOVERSION   07/29/2011   Procedure: TRANSESOPHAGEAL ECHOCARDIOGRAM (TEE);  Surgeon: Josue Hector, MD;  Location: [facility];  Service: Cardiovascular;  Laterality: N/A;  . TOTAL HIP REVISION Right 09/05/2012   Procedure: RIGHT HIP RESECTION ARTHROPLASTY WITH ANTIBIOTIC SPACERS;  Surgeon: [name], MD;  Location: [facility];  Service: Orthopedics;  Laterality: Right;  . TOTAL HIP REVISION Right 11/30/2012   Procedure: RIGHT TOTAL HIP ARTHROPLASTY REIMPLANTATION;  Surgeon: [name], MD;  Location: [facility];  Service: Orthopedics;  Laterality: Right;     reports that he has never smoked. He has never used smokeless tobacco. He reports current alcohol use. He reports that he does not use drugs.  No Known Allergies  Family History  Problem Relation Age of Onset  . Melanoma Mother   . Heart attack Mother   . Hyperlipidemia Neg Hx   . Sudden death Neg Hx   . Hypertension Neg Hx   . Diabetes Neg Hx     Prior to Admission medications   Medication Sig Start Date End Date Taking? Authorizing Provider  acetaminophen (TYLENOL) 160 MG/5ML solution Place 20.3 mLs (650 mg total) into feeding tube every 4 (four) hours as needed for mild pain or fever (temp > 101.5). Patient not taking: Reported on 07/21/2020 08/27/19   Donita Brooks, NP  albuterol (PROVENTIL) (2.5 MG/3ML) 0.083% nebulizer solution Take 3 mLs (2.5 mg total) by nebulization every 2 (two) hours as needed for wheezing. 07/28/20   Saguier, Percell Miller, PA-C  Amino Acids-Protein Hydrolys (FEEDING SUPPLEMENT, PRO-STAT SUGAR FREE 64,) LIQD Place 30 mLs into feeding tube daily.    [provider]  calamine lotion Apply topically as needed for itching. 07/18/20   Pokhrel, Corrie Mckusick, MD  chlorhexidine (PERIDEX) 0.12 % solution 15 mLs by Mouth Rinse route 2 (two) times daily. Patient taking differently: 15 mLs by Mouth Rinse route 2 (two) times daily as needed (mouth pain). 04/17/20   Florencia Reasons, MD  Chlorhexidine Gluconate Cloth 2 % PADS Apply 6 each  topically daily. Patient not taking: No sig reported 04/18/20   Florencia Reasons, MD  Cholecalciferol (VITAMIN D3) 10 MCG/ML LIQD Give 400 Units by tube daily. Patient taking differently: Place 400 Units into feeding tube daily. 04/17/20   Florencia Reasons, MD  furosemide (LASIX) 20 MG tablet PLACE 1 TABLET (20 MG TOTAL) INTO FEEDING TUBE DAILY. 08/03/20   Saguier, Percell Miller, PA-C  guaiFENesin (ROBITUSSIN) 100 MG/5ML SOLN Place 5 mLs (100 mg total) into feeding tube every 4 (four) hours as needed for cough or to loosen phlegm. 07/18/20   Pokhrel, Corrie Mckusick, MD  HYOSYNE 0.125 MG/ML solution  07/20/20   [provider]  lidocaine-prilocaine (EMLA) cream Apply to affected area once Patient taking differently: Apply 1 application topically daily as needed (access port). Apply to affected area once 07/09/20   Volanda Napoleon, MD  loperamide HCl (IMODIUM) 1 MG/7.5ML suspension Place 15 mLs (2 mg total) into feeding tube as needed for diarrhea or loose stools (maximum 60m per day). 07/20/20   Pokhrel, LCorrie Mckusick MD  LORazepam (ATIVAN) 0.5 MG tablet Take 1 tablet (0.5  mg total) by mouth every 6 (six) hours as needed (Nausea or vomiting). 07/09/20   Volanda Napoleon, MD  magnesium oxide (MAG-OX) 400 MG tablet Take 400 mg by mouth in the morning and at bedtime. 07/18/20   [provider]  Multiple Vitamin (MULTIVITAMIN) LIQD Place 15 mLs into feeding tube daily. 08/28/19   Donita Brooks, NP  Nutritional Supplements (FEEDING SUPPLEMENT, OSMOLITE 1.2 CAL,) LIQD Place 1,000 mLs into feeding tube continuous. Patient taking differently: Place 70 mLs into feeding tube continuous. 04/17/20   Florencia Reasons, MD  ondansetron (ZOFRAN) 4 MG tablet Take 1 tablet (4 mg total) by mouth every 6 (six) hours as needed for nausea. 07/18/20   Pokhrel, Corrie Mckusick, MD  sertraline (ZOLOFT) 50 MG tablet GIVE 1 TAB PER TUBE DAILY 07/07/20   Saguier, Percell Miller, PA-C  traZODone (DESYREL) 100 MG tablet PLACE 1 TABLET (100 MG TOTAL) INTO FEEDING TUBE AT BEDTIME. 08/03/20    Saguier, Percell Miller, PA-C  triamcinolone (KENALOG) 0.1 % Apply 1 application topically 2 (two) times daily as needed (itching).    [provider]  Water For Irrigation, Sterile (FREE WATER) SOLN Place 100 mLs into feeding tube every 4 (four) hours. Patient taking differently: Place 120 mLs into feeding tube every 4 (four) hours. 04/17/20   Florencia Reasons, MD    Physical Exam:  Constitutional: Older male who appears to be in some respiratory distress Vitals:   08/13/20 1430 08/13/20 1445 08/13/20 1500 08/13/20 1515  BP: 116/76 123/72 121/76 116/68  Pulse: (!) 123 (!) 119 (!) 123 (!) 118  Resp: (!) 25 (!) 32 (!) 34 (!) 35  Temp:      TempSrc:      SpO2: 92% 96% 95% 94%  Weight:      Height:       Eyes: PERRL, lids and conjunctivae normal ENMT: Mucous membranes are moist. Posterior pharynx clear of any exudate or lesions.Normal dentition.  Neck:  tracheostomy in place with green sputum present. Respiratory: Tachypneic with coarse breath sounds appreciated in both lung fields.  Patient currently on 2 L of high flow nasal cannula oxygen for Cardiovascular: Tachycardia, no murmurs / rubs / gallops. No extremity edema. 2+ pedal pulses. No carotid bruits.  Abdomen: no tenderness, no masses palpated. No hepatosplenomegaly. Bowel sounds positive.  PEG tube in place. Musculoskeletal: no clubbing / cyanosis. No joint deformity upper and lower extremities. Good ROM, no contractures. Normal muscle tone.  Skin: no rashes, lesions, ulcers. No induration Neurologic: CN 2-12 grossly intact. Sensation intact, DTR normal. Strength 5/5 in all 4.  Psychiatric: Normal judgment and insight. Alert and oriented x 3. Normal mood.     Labs on Admission: I have personally reviewed following labs and imaging studies  CBC: Recent Labs  Lab 08/13/20 1248  WBC 12.2*  NEUTROABS 9.2*  HGB 12.2*  HCT 39.9  MCV 88.5  PLT 841   Basic Metabolic Panel: Recent Labs  Lab 08/13/20 1248  NA 134*  K 4.3  CL  93*  CO2 35*  GLUCOSE 123*  BUN 23  CREATININE 0.70  CALCIUM 9.2   GFR: Estimated Creatinine Clearance: 80.8 mL/min (by C-G formula based on SCr of 0.7 mg/dL). Liver Function Tests: Recent Labs  Lab 08/13/20 1248  AST 23  ALT 21  ALKPHOS 66  BILITOT 0.7  PROT 7.4  ALBUMIN 2.5*   No results for input(s): LIPASE, AMYLASE in the last 168 hours. No results for input(s): AMMONIA in the last 168 hours. Coagulation Profile: Recent  Labs  Lab 08/13/20 1248  INR 1.2   Cardiac Enzymes: No results for input(s): CKTOTAL, CKMB, CKMBINDEX, TROPONINI in the last 168 hours. BNP (last 3 results) No results for input(s): PROBNP in the last 8760 hours. HbA1C: No results for input(s): HGBA1C in the last 72 hours. CBG: No results for input(s): GLUCAP in the last 168 hours. Lipid Profile: No results for input(s): CHOL, HDL, LDLCALC, TRIG, CHOLHDL, LDLDIRECT in the last 72 hours. Thyroid Function Tests: No results for input(s): TSH, T4TOTAL, FREET4, T3FREE, THYROIDAB in the last 72 hours. Anemia Panel: No results for input(s): VITAMINB12, FOLATE, FERRITIN, TIBC, IRON, RETICCTPCT in the last 72 hours. Urine analysis:    Component Value Date/Time   COLORURINE YELLOW 05/25/2020 0106   APPEARANCEUR CLEAR 05/25/2020 0106   LABSPEC 1.017 05/25/2020 0106   PHURINE 6.0 05/25/2020 0106   GLUCOSEU NEGATIVE 05/25/2020 0106   HGBUR SMALL (A) 05/25/2020 0106   HGBUR negative 03/23/2007 0000   BILIRUBINUR NEGATIVE 05/25/2020 0106   BILIRUBINUR neg 11/11/2016 1017   KETONESUR NEGATIVE 05/25/2020 0106   PROTEINUR NEGATIVE 05/25/2020 0106   UROBILINOGEN 0.2 11/11/2016 1017   UROBILINOGEN 0.2 11/08/2012 0915   NITRITE NEGATIVE 05/25/2020 0106   LEUKOCYTESUR NEGATIVE 05/25/2020 0106   Sepsis Labs: Recent Results (from the past 240 hour(s))  Resp Panel by RT-PCR (Flu A&B, Covid) Nasopharyngeal Swab     Status: None   Collection Time: 08/13/20 12:46 PM   Specimen: Nasopharyngeal Swab;  Nasopharyngeal(NP) swabs in vial transport medium  Result Value Ref Range Status   SARS Coronavirus 2 by RT PCR NEGATIVE NEGATIVE Final    Comment: (NOTE) SARS-CoV-2 target nucleic acids are NOT DETECTED.  The SARS-CoV-2 RNA is generally detectable in upper respiratory specimens during the acute phase of infection. The lowest concentration of SARS-CoV-2 viral copies this assay can detect is 138 copies/mL. A negative result does not preclude SARS-Cov-2 infection and should not be used as the sole basis for treatment or other patient management decisions. A negative result may occur with  improper specimen collection/handling, submission of specimen other than nasopharyngeal swab, presence of viral mutation(s) within the areas targeted by this assay, and inadequate number of viral copies(<138 copies/mL). A negative result must be combined with clinical observations, patient history, and epidemiological information. The expected result is Negative.  Fact Sheet for Patients:  EntrepreneurPulse.com.au  Fact Sheet for Healthcare Providers:  IncredibleEmployment.be  This test is no t yet approved or cleared by the [place] FDA and  has been authorized for detection and/or diagnosis of SARS-CoV-2 by FDA under an Emergency Use Authorization (EUA). This EUA will remain  in effect (meaning this test can be used) for the duration of the COVID-19 declaration under Section 564(b)(1) of the Act, 21 U.S.C.section 360bbb-3(b)(1), unless the authorization is terminated  or revoked sooner.       Influenza A by PCR NEGATIVE NEGATIVE Final   Influenza B by PCR NEGATIVE NEGATIVE Final    Comment: (NOTE) The Xpert Xpress SARS-CoV-2/FLU/RSV plus assay is intended as an aid in the diagnosis of influenza from Nasopharyngeal swab specimens and should not be used as a sole basis for treatment. Nasal washings and aspirates are unacceptable for Xpert Xpress  SARS-CoV-2/FLU/RSV testing.  Fact Sheet for Patients: EntrepreneurPulse.com.au  Fact Sheet for Healthcare Providers: IncredibleEmployment.be  This test is not yet approved or cleared by the [place] FDA and has been authorized for detection and/or diagnosis of SARS-CoV-2 by FDA under an Emergency Use Authorization (EUA). This EUA will  remain in effect (meaning this test can be used) for the duration of the COVID-19 declaration under Section 564(b)(1) of the Act, 21 U.S.C. section 360bbb-3(b)(1), unless the authorization is terminated or revoked.  Performed at [facility], [place] [address], [place], [place] [address]      Radiological Exams on Admission: DG Chest Port 1 View  Result Date: 08/13/2020 CLINICAL DATA:  Shortness of breath and productive cough for 3-4 days. EXAM: PORTABLE CHEST 1 VIEW COMPARISON:  Single-view of the chest 07/21/2020, 05/25/2020 and 04/07/2020. FINDINGS: Tracheostomy tube and Port-A-Cath are again seen. Coarse pulmonary opacities in the lung bases and the periphery of the chest bilaterally again seen. No definite new airspace disease. No pneumothorax or pleural effusion. Marked cardiomegaly. Bulky lymphadenopathy is better seen on the prior chest CT. IMPRESSION: Chronic lung disease without definite acute process. Cardiomegaly. Lymphadenopathy is seen on prior CT. Electronically Signed   By: Inge Rise M.D.   On: 08/13/2020 12:59    EKG: Independently reviewed.  Sinus tachycardia 121 beats from  Assessment/Plan Sepsis suspected secondary to pneumonia: Acute.  On admission to the emergency department patient reported fevers up to 100.4 F at home(afebrile while in the ED), but was tachycardic and tachypneic.  White blood cell count was elevated up to 12.2, but lactic acid was noted to be within normal limits.  Chest x-ray noted did not note any definitive acute process.  However, given patient's history suspect  possibility of healthcare associated pneumonia vs aspiration as patient reports that he stopped eating food by mouth because he felt it was making symptoms worse.  He had been started on vancomycin and cefepime. -Admit to a progressive bed -Follow-up blood and urine cultures -Continue antibiotics of Rocephin, azithromycin, and vancomycin -Continue Robitussin -de-escalate when medically appropriate  Acute on chronic respiratory failure with hypoxia S/p tracheostomy: At baseline patient is on 8 L of oxygen normally, but requiring continued here in the emergency department maintain O2 saturations. -Continuous pulse oximetry with oxygen to maintain O2 saturations greater than 92% -Check ABG stat -Tracheostomy care  -Albuterol nebs 4 times daily and as needed.  Dehydration: Acute.  Labs significant for BUN 23 and creatinine 0.7.  The elevated BUN to creatinine ratio suggests prerenal cause of symptoms. -Continue IV fluids  S/p PEG -Consult dietitian to ensure appropriate nutrition requirements met  Hodgkin's lymphoma: Patient currently followed by Dr. Marin Olp of oncology on chemotherapy -Dr. Marin Olp messaged in regards to the patient's admission into the hospital.  Anemia of chronic disease: Hemoglobin 12.2 on admission which appears improved, but possibly related to hemoconcentration as prior to discharge on 4/27 patient's hemoglobin was around 9.3. -Continue to monitor DVT prophylaxis:lovenox   Code Status: Full  Family Communication: Wife updated over the phone Disposition Plan: Hopefully discharge home once medically Consults called: None Admission status: Inpatient, require more  Norval Morton MD [facility]   If 7PM-7AM, please contact night-coverage   08/13/2020, 3:31 PM

## 2020-08-13 NOTE — ED Notes (Signed)
Assuming care, Pt rewsting in bed. NAD noted. VSS. Updated on plan of care. Aware awaiting bed upstairs and report been attempted. Verbalized understanding. BVed low and locked.

## 2020-08-14 ENCOUNTER — Encounter (HOSPITAL_COMMUNITY): Payer: Self-pay | Admitting: Internal Medicine

## 2020-08-14 DIAGNOSIS — J9621 Acute and chronic respiratory failure with hypoxia: Secondary | ICD-10-CM | POA: Diagnosis not present

## 2020-08-14 DIAGNOSIS — Z930 Tracheostomy status: Secondary | ICD-10-CM | POA: Diagnosis not present

## 2020-08-14 DIAGNOSIS — D638 Anemia in other chronic diseases classified elsewhere: Secondary | ICD-10-CM | POA: Diagnosis not present

## 2020-08-14 DIAGNOSIS — J189 Pneumonia, unspecified organism: Secondary | ICD-10-CM | POA: Diagnosis not present

## 2020-08-14 DIAGNOSIS — A419 Sepsis, unspecified organism: Secondary | ICD-10-CM | POA: Diagnosis not present

## 2020-08-14 DIAGNOSIS — C8112 Nodular sclerosis classical Hodgkin lymphoma, intrathoracic lymph nodes: Secondary | ICD-10-CM | POA: Diagnosis not present

## 2020-08-14 LAB — BLOOD CULTURE ID PANEL (REFLEXED) - BCID2

## 2020-08-14 LAB — CBC
HCT: 26.5 % — ABNORMAL LOW (ref 39.0–52.0)
Hemoglobin: 8.2 g/dL — ABNORMAL LOW (ref 13.0–17.0)
MCH: 27.4 pg (ref 26.0–34.0)
MCHC: 30.9 g/dL (ref 30.0–36.0)
MCV: 88.6 fL (ref 80.0–100.0)
Platelets: 176 10*3/uL (ref 150–400)
RBC: 2.99 MIL/uL — ABNORMAL LOW (ref 4.22–5.81)
RDW: 18.2 % — ABNORMAL HIGH (ref 11.5–15.5)
WBC: 12 10*3/uL — ABNORMAL HIGH (ref 4.0–10.5)
nRBC: 0 % (ref 0.0–0.2)

## 2020-08-14 LAB — GLUCOSE, CAPILLARY
Glucose-Capillary: 150 mg/dL — ABNORMAL HIGH (ref 70–99)
Glucose-Capillary: 207 mg/dL — ABNORMAL HIGH (ref 70–99)
Glucose-Capillary: 218 mg/dL — ABNORMAL HIGH (ref 70–99)

## 2020-08-14 LAB — BASIC METABOLIC PANEL
Anion gap: 7 (ref 5–15)
BUN: 17 mg/dL (ref 8–23)
CO2: 32 mmol/L (ref 22–32)
Calcium: 9.3 mg/dL (ref 8.9–10.3)
Chloride: 98 mmol/L (ref 98–111)
Creatinine, Ser: 0.64 mg/dL (ref 0.61–1.24)
GFR, Estimated: >60 mL/min (ref >60)
Glucose, Bld: 151 mg/dL — ABNORMAL HIGH (ref 70–99)
Potassium: 4.1 mmol/L (ref 3.5–5.1)
Sodium: 137 mmol/L (ref 135–145)

## 2020-08-14 LAB — MRSA PCR SCREENING: MRSA by PCR: NEGATIVE

## 2020-08-14 LAB — STREP PNEUMONIAE URINARY ANTIGEN: Strep Pneumo Urinary Antigen: NEGATIVE

## 2020-08-14 MED ORDER — SODIUM CHLORIDE 0.9% FLUSH: 10.0000 mL | INTRAVENOUS | Status: DC | PRN

## 2020-08-14 MED ORDER — SODIUM CHLORIDE 0.9% FLUSH
10.0000 mL | Freq: Two times a day (BID) | INTRAVENOUS | Status: DC
  Administered 2020-08-14 – 2020-08-25 (×22): 10 mL

## 2020-08-14 MED ORDER — PANTOPRAZOLE SODIUM 40 MG PO TBEC
40.0000 mg | DELAYED_RELEASE_TABLET | Freq: Every day | ORAL | Status: DC
  Administered 2020-08-14 – 2020-08-16 (×3): 40 mg via ORAL
  Filled 2020-08-14 (×3): qty 1

## 2020-08-14 MED ORDER — METHYLPREDNISOLONE SODIUM SUCC 40 MG IJ SOLR
40.0000 mg | Freq: Three times a day (TID) | INTRAMUSCULAR | Status: DC
  Administered 2020-08-14 – 2020-08-18 (×12): 40 mg via INTRAVENOUS
  Filled 2020-08-14 (×14): qty 1

## 2020-08-14 MED ORDER — CHLORHEXIDINE GLUCONATE CLOTH 2 % EX PADS
6.0000 | MEDICATED_PAD | Freq: Every day | CUTANEOUS | Status: DC
  Administered 2020-08-14 – 2020-08-25 (×12): 6 via TOPICAL

## 2020-08-14 MED ORDER — IPRATROPIUM-ALBUTEROL 0.5-2.5 (3) MG/3ML IN SOLN
3.0000 mL | Freq: Three times a day (TID) | RESPIRATORY_TRACT | Status: DC
  Administered 2020-08-14 – 2020-08-18 (×12): 3 mL via RESPIRATORY_TRACT
  Filled 2020-08-14 (×12): qty 3

## 2020-08-14 MED ORDER — HYOSCYAMINE SULFATE 0.125 MG SL SUBL: 1.0000 mg | SUBLINGUAL_TABLET | Freq: Every day | SUBLINGUAL | Status: DC

## 2020-08-14 MED ORDER — ENOXAPARIN SODIUM 30 MG/0.3ML IJ SOSY: 30.0000 mg | PREFILLED_SYRINGE | INTRAMUSCULAR | Status: DC

## 2020-08-14 MED ORDER — ENOXAPARIN SODIUM 40 MG/0.4ML IJ SOSY
40.0000 mg | PREFILLED_SYRINGE | INTRAMUSCULAR | Status: DC
  Administered 2020-08-14: 40 mg via SUBCUTANEOUS
  Filled 2020-08-14: qty 0.4

## 2020-08-14 MED ORDER — OSMOLITE 1.2 CAL PO LIQD
1000.0000 mL | ORAL | Status: DC
  Administered 2020-08-14 – 2020-08-17 (×6): 1000 mL
  Filled 2020-08-14 (×7): qty 1000

## 2020-08-14 MED ORDER — HYOSCYAMINE SULFATE 0.125 MG PO TBDP
0.1250 mg | ORAL_TABLET | ORAL | Status: DC | PRN
  Filled 2020-08-14 (×3): qty 1

## 2020-08-14 MED ORDER — FREE WATER
180.0000 mL | Freq: Four times a day (QID) | Status: DC
  Administered 2020-08-14 – 2020-08-25 (×42): 180 mL

## 2020-08-14 NOTE — Consult Note (Signed)
NAME:  Steve [name], MRN:  [id number], DOB:  1955-07-09, LOS: 1 ADMISSION DATE:  08/13/2020, CONSULTATION DATE: 08/14/2020 REFERRING MD: [facility], CHIEF COMPLAINT: Increasing shortness of breath  History of Present Illness:  65 year old male well-known to the pulmonary practice with chronic trach in the setting of COVID-19 in 2021 developed  acute respiratory distress syndrome, complicated by pulmonary fibrosis requiring chronic tracheostomy.  This is further complicated by his Hodgkin's lymphoma requiring chemotherapy.  He was in his usual state of health using 8 L of oxygen via his tracheostomy until 3 to 4 days ago he noticed increasing sputum production, difficulty clearing his airway, and increasing FiO2 needs with saturations declining.  He presented to [facility] his FiO2 was increased to 60% via high flow trach collar he is now saturating 97%.  He does have bilateral wheezes and coarse rhonchi.  He has a fenestrated trach in place.  Currently on antimicrobial therapy, high flow FiO2 and appears to be responding to therapy.  Pertinent  Medical History   Past Medical History:  Diagnosis Date  . Abscess of muscle 08/10/2011   staph infection of right hip   . Acute on chronic respiratory failure with hypoxia ([place])   . Acute respiratory distress syndrome (ARDS) due to COVID-19 virus ([place])   . Anxiety   . Diabetes mellitus without complication ([place])   . GERD (gastroesophageal reflux disease)    rare reflux - no meds for reflux - NO PROBLEM IN PAST SEVERAL YRS  . HCAP (healthcare-associated pneumonia)   . Hip dysplasia, congenital    no surgery as a child for hip dysplasia - has had bilateral hip replacements as an adult  . Hodgkin lymphoma ([place])   . Hypertension   . Pancreatitis   . Pneumothorax, acute   . Postoperative anemia due to acute blood loss 09/07/2012  . Septic arthritis of hip ([place]) 09/05/2012   PT'S TOTAL HIP JOINT REMOVED - ANTIBIOTIC SPACE PLACED AND PT HAS FINISHED IV  ANTIBIOTICS ( PICC LINE REMOVED)  . Sleep apnea    USES CPAP    Significant Hospital Events: Including procedures, antibiotic start and stop dates in addition to other pertinent events   .   Interim History / Subjective:  65 year old male with ARDS from COVID-19 with pulmonary fibrosis and frequent admissions for hypoxia.  Objective   Blood pressure 135/63, pulse 89, temperature 97.6 F (36.4 C), temperature source Axillary, resp. rate 20, height 5\' 5"  (1.651 m), weight 61.6 kg, SpO2 94 %.    FiO2 (%):  [60 %] 60 %   Intake/Output Summary (Last 24 hours) at 08/14/2020 0940 Last data filed at 08/14/2020 2197 Gross per 24 hour  Intake 1200 ml  Output 1475 ml  Net -275 ml   Filed Weights   08/13/20 1233 08/13/20 2024  Weight: 61.2 kg 61.6 kg    Examination: General: Appears older than stated age HENT: Fenestrated trach is in place functioning well Lungs: Rhonchi with expiratory wheezing Cardiovascular: Sounds are regular Abdomen: Positive bowel Extremities: Warm and dry Neuro: No overt defects GU: Voids  Labs/imaging that I havepersonally reviewed  (right click and "Reselect all SmartList Selections" daily)  Aabg,cxr, bmet, [facility] Problem list     Assessment & Plan:  Acute on chronic hypoxic respiratory failure in setting of tracheostomy requirement from COVID-19 in 2021 with frequent admissions for hypoxia and increased tracheal secretions.  Positive for ARDS with questionable pneumonitis from Brenttuximab complicated by his Hodgkin's lymphoma treated with  current brent rituximab [facility] 09/2020 increasing respiratory distress now requiring 60% FiO2 via trach collar via his fenestrated trach.  Pulmonary critical care asked to evaluate and treat. Lactic acid and procalcitonin do not support sepsis.  Arterial blood gases 7.3 5/59/85  08/14/2020 is not in acute respiratory distress is able to ventilate via his trachostomy and does not need  mechanical ventilatory support at this time Agree with antimicrobial therapy Agree with steroid therapy, he is wheezing Serial chest x-ray Pulmonary toilet  Hodgkin's lymphoma Per oncology  Generalized failure to thrive in the setting of ARDS, COVID-19, trach dependent and malnutrition. Continue to monitor   Gastroesophageal reflux disease PPI May need repeat swallowing evaluation   Best practice per primary    Labs   CBC: Recent Labs  Lab 08/13/20 1248 08/14/20 0107  WBC 12.2* 12.0*  NEUTROABS 9.2*  --   HGB 12.2* 8.2*  HCT 39.9 26.5*  MCV 88.5 88.6  PLT 150 989    Basic Metabolic Panel: Recent Labs  Lab 08/13/20 1248 08/14/20 0107  NA 134* 137  K 4.3 4.1  CL 93* 98  CO2 35* 32  GLUCOSE 123* 151*  BUN 23 17  CREATININE 0.70 0.64  CALCIUM 9.2 9.3   GFR: Estimated Creatinine Clearance: 81.1 mL/min (by C-G formula based on SCr of 0.64 mg/dL). Recent Labs  Lab 08/13/20 1220 08/13/20 1248 08/13/20 1547 08/13/20 2017 08/14/20 0107  PROCALCITON  --   --   --  0.37  --   WBC  --  12.2*  --   --  12.0*  LATICACIDVEN 1.1  --  0.9  --   --     Liver Function Tests: Recent Labs  Lab 08/13/20 1248  AST 23  ALT 21  ALKPHOS 66  BILITOT 0.7  PROT 7.4  ALBUMIN 2.5*   No results for input(s): LIPASE, AMYLASE in the last 168 hours. No results for input(s): AMMONIA in the last 168 hours.  ABG    Component Value Date/Time   PHART 7.359 08/13/2020 2029   PCO2ART 59.8 (H) 08/13/2020 2029   PO2ART 85.4 08/13/2020 2029   HCO3 32.8 (H) 08/13/2020 2029   TCO2 28 07/08/2019 1331   O2SAT 96.2 08/13/2020 2029     Coagulation Profile: Recent Labs  Lab 08/13/20 1248  INR 1.2    Cardiac Enzymes: No results for input(s): CKTOTAL, CKMB, CKMBINDEX, TROPONINI in the last 168 hours.  HbA1C: Hgb A1c MFr Bld  Date/Time Value Ref Range Status  05/25/2020 05:42 PM 5.8 (H) 4.8 - 5.6 % Final    Comment:    (NOTE)         Prediabetes: 5.7 - 6.4          Diabetes: >6.4         Glycemic control for adults with diabetes: <7.0   03/23/2020 12:39 PM 5.7 (H) 4.8 - 5.6 % Final    Comment:    (NOTE) Pre diabetes:          5.7%-6.4%  Diabetes:              >6.4%  Glycemic control for   <7.0% adults with diabetes     CBG: Recent Labs  Lab 08/13/20 2343 08/14/20 0608  GLUCAP 147* 150*    Review of Systems:   Per chart  Past Medical History:  He,  has a past medical history of Abscess of muscle (08/10/2011), Acute on chronic respiratory failure with hypoxia ([place]), Acute respiratory distress  syndrome (ARDS) due to COVID-19 virus ([place]), Anxiety, Diabetes mellitus without complication ([place]), GERD (gastroesophageal reflux disease), HCAP (healthcare-associated pneumonia), Hip dysplasia, congenital, Hodgkin lymphoma ([place]), Hypertension, Pancreatitis, Pneumothorax, acute, Postoperative anemia due to acute blood loss (09/07/2012), Septic arthritis of hip ([place]) (09/05/2012), and Sleep apnea.   Surgical History:   Past Surgical History:  Procedure Laterality Date  . COLONOSCOPY  04/26/2007  . HERNIA REPAIR     inguinal hernia x3  . IR GASTROSTOMY TUBE MOD SED  08/22/2019  . IR IMAGING GUIDED PORT INSERTION  03/29/2019  . JOINT REPLACEMENT  2002 & 2007   bilateral hip replacement  . LYMPH NODE BIOPSY Right 03/20/2019   Procedure: DEEP RIGHT SUPRACLAVICULAR LYMPH NODE EXCISION;  Surgeon: Fanny Skates, MD;  Location: [facility];  Service: General;  Laterality: Right;  . MULTIPLE EXTRACTIONS WITH ALVEOLOPLASTY  07/28/2011   Procedure: MULTIPLE EXTRACION WITH ALVEOLOPLASTY;  Surgeon: Lenn Cal, DDS;  Location: [facility];  Service: Oral Surgery;  Laterality: N/A;  Extraction of tooth #'s 2,3,4,5,6,11,12,13,15,19,22 with alveoloplasty.  . shoulder repair - right for separation of shoulder    . TEE WITHOUT CARDIOVERSION  07/29/2011   Procedure: TRANSESOPHAGEAL ECHOCARDIOGRAM (TEE);  Surgeon: Josue Hector, MD;  Location: [facility];   Service: Cardiovascular;  Laterality: N/A;  . TOTAL HIP REVISION Right 09/05/2012   Procedure: RIGHT HIP RESECTION ARTHROPLASTY WITH ANTIBIOTIC SPACERS;  Surgeon: [name], MD;  Location: [facility];  Service: Orthopedics;  Laterality: Right;  . TOTAL HIP REVISION Right 11/30/2012   Procedure: RIGHT TOTAL HIP ARTHROPLASTY REIMPLANTATION;  Surgeon: [name], MD;  Location: [facility];  Service: Orthopedics;  Laterality: Right;     Social History:   reports that he has never smoked. He has never used smokeless tobacco. He reports current alcohol use. He reports that he does not use drugs.   Family History:  His family history includes Heart attack in his mother; Melanoma in his mother. There is no history of Hyperlipidemia, Sudden death, Hypertension, or Diabetes.   Allergies No Known Allergies   Home Medications  Prior to Admission medications   Medication Sig Start Date End Date Taking? Authorizing Provider  albuterol (PROVENTIL) (2.5 MG/3ML) 0.083% nebulizer solution Take 3 mLs (2.5 mg total) by nebulization every 2 (two) hours as needed for wheezing. 07/28/20  Yes Saguier, Percell Miller, PA-C  Amino Acids-Protein Hydrolys (FEEDING SUPPLEMENT, PRO-STAT SUGAR FREE 64,) LIQD Place 30 mLs into feeding tube daily.   Yes [provider]  calamine lotion Apply topically as needed for itching. Patient taking differently: Apply 1 application topically daily as needed for itching. 07/18/20  Yes Pokhrel, Laxman, MD  chlorhexidine (PERIDEX) 0.12 % solution 15 mLs by Mouth Rinse route 2 (two) times daily. Patient taking differently: 15 mLs by Mouth Rinse route 2 (two) times daily as needed (mouth pain). 04/17/20  Yes Florencia Reasons, MD  Chlorhexidine Gluconate Cloth 2 % PADS Apply 6 each topically daily. 04/18/20  Yes Florencia Reasons, MD  Cholecalciferol (VITAMIN D3) 10 MCG/ML LIQD Give 400 Units by tube daily. Patient taking differently: Place 400 Units into feeding tube daily. 04/17/20  Yes Florencia Reasons, MD   furosemide (LASIX) 20 MG tablet PLACE 1 TABLET (20 MG TOTAL) INTO FEEDING TUBE DAILY. 08/03/20  Yes Saguier, Percell Miller, PA-C  guaiFENesin (ROBITUSSIN) 100 MG/5ML SOLN Place 5 mLs (100 mg total) into feeding tube every 4 (four) hours as needed for cough or to loosen phlegm. 07/18/20  Yes Pokhrel, Corrie Mckusick, MD  HYOSYNE 0.125  MG/ML solution Take 0.125 mg by mouth every 4 (four) hours as needed for bladder spasms or cramping. 07/20/20  Yes [provider]  lidocaine-prilocaine (EMLA) cream Apply to affected area once Patient taking differently: Apply 1 application topically daily as needed (access port). Apply to affected area once 07/09/20  Yes Ennever, Rudell Cobb, MD  loperamide HCl (IMODIUM) 1 MG/7.5ML suspension Place 15 mLs (2 mg total) into feeding tube as needed for diarrhea or loose stools (maximum 16mg  per day). Patient taking differently: Place 2 mg into feeding tube daily as needed for diarrhea or loose stools (maximum 16mg  per day). 07/20/20  Yes Pokhrel, Laxman, MD  LORazepam (ATIVAN) 0.5 MG tablet Take 1 tablet (0.5 mg total) by mouth every 6 (six) hours as needed (Nausea or vomiting). 07/09/20  Yes Volanda Napoleon, MD  magnesium oxide (MAG-OX) 400 MG tablet Take 400 mg by mouth in the morning and at bedtime. 07/18/20  Yes [provider]  Multiple Vitamin (MULTIVITAMIN) LIQD Place 15 mLs into feeding tube daily. 08/28/19  Yes Ollis, Cleaster Corin, NP  Nutritional Supplements (FEEDING SUPPLEMENT, OSMOLITE 1.2 CAL,) LIQD Place 1,000 mLs into feeding tube continuous. Patient taking differently: Place 70 mLs into feeding tube continuous. 04/17/20  Yes Florencia Reasons, MD  ondansetron (ZOFRAN) 4 MG tablet Take 1 tablet (4 mg total) by mouth every 6 (six) hours as needed for nausea. 07/18/20  Yes Pokhrel, Laxman, MD  sertraline (ZOLOFT) 50 MG tablet GIVE 1 TAB PER TUBE DAILY Patient taking differently: Take 50 mg by mouth daily. 07/07/20  Yes Saguier, Percell Miller, PA-C  traZODone (DESYREL) 100 MG tablet PLACE 1  TABLET (100 MG TOTAL) INTO FEEDING TUBE AT BEDTIME. 08/03/20  Yes Saguier, Percell Miller, PA-C  triamcinolone (KENALOG) 0.1 % Apply 1 application topically 2 (two) times daily as needed (itching).   Yes [provider]          Gaylyn Lambert ACNP Acute Care Nurse Practitioner [place] Please consult Amion 08/14/2020, 9:40 AM

## 2020-08-14 NOTE — Progress Notes (Signed)
PHARMACY - PHYSICIAN COMMUNICATION CRITICAL VALUE ALERT - BLOOD CULTURE IDENTIFICATION (BCID)  Steve [name] is an 65 y.o. male who presented to [facility] on 08/13/2020 with a chief complaint of shortness of breath, concern for PNA.  Assessment: GPC, 1/4 (aerobic) staph spp, likely contaminant  Name of physician (or Provider) Contacted: Dr. [facility]ron [facility]  Current antibiotics: rocephin and azithromycin for PNA  Changes to prescribed antibiotics recommended: no changes recommended  Results for orders placed or performed during the hospital encounter of 08/13/20  Blood Culture ID Panel (Reflexed) (Collected: 08/13/2020 12:25 PM)  Result Value Ref Range   Enterococcus faecalis NOT DETECTED NOT DETECTED   Enterococcus Faecium NOT DETECTED NOT DETECTED   Listeria monocytogenes NOT DETECTED NOT DETECTED   Staphylococcus species DETECTED (A) NOT DETECTED   Staphylococcus aureus (BCID) NOT DETECTED NOT DETECTED   Staphylococcus epidermidis NOT DETECTED NOT DETECTED   Staphylococcus lugdunensis NOT DETECTED NOT DETECTED   Streptococcus species NOT DETECTED NOT DETECTED   Streptococcus agalactiae NOT DETECTED NOT DETECTED   Streptococcus pneumoniae NOT DETECTED NOT DETECTED   Streptococcus pyogenes NOT DETECTED NOT DETECTED   A.calcoaceticus-baumannii NOT DETECTED NOT DETECTED   Bacteroides fragilis NOT DETECTED NOT DETECTED   Enterobacterales NOT DETECTED NOT DETECTED   Enterobacter cloacae complex NOT DETECTED NOT DETECTED   Escherichia coli NOT DETECTED NOT DETECTED   Klebsiella aerogenes NOT DETECTED NOT DETECTED   Klebsiella oxytoca NOT DETECTED NOT DETECTED   Klebsiella pneumoniae NOT DETECTED NOT DETECTED   Proteus species NOT DETECTED NOT DETECTED   Salmonella species NOT DETECTED NOT DETECTED   Serratia marcescens NOT DETECTED NOT DETECTED   Haemophilus influenzae NOT DETECTED NOT DETECTED   Neisseria meningitidis NOT DETECTED NOT DETECTED   Pseudomonas aeruginosa NOT DETECTED NOT  DETECTED   Stenotrophomonas maltophilia NOT DETECTED NOT DETECTED   Candida albicans NOT DETECTED NOT DETECTED   Candida auris NOT DETECTED NOT DETECTED   Candida glabrata NOT DETECTED NOT DETECTED   Candida krusei NOT DETECTED NOT DETECTED   Candida parapsilosis NOT DETECTED NOT DETECTED   Candida tropicalis NOT DETECTED NOT DETECTED   Cryptococcus neoformans/gattii NOT DETECTED NOT DETECTED    Fara Olden, PharmD PGY-1 Pharmacy Resident 08/14/2020 1:36 PM Please see AMION for all pharmacy numbers

## 2020-08-14 NOTE — Progress Notes (Signed)
PROGRESS NOTE    [name]  FMB:[id number] DOB: 12/08/1955 DOA: 08/13/2020 PCP: [name], PA-C     Chief Complaint  Patient presents with  . Shortness of Breath    Brief Narrative:    HPI: Steve [name] is a 65 y.o. male with medical history significant of chronic tracheostomy after prolonged hospitalization in 2021 related with pneumonia due to [id number] complicated by ARDS and pulmonary fibrosis +/-pneumonitis from brentuximab, s/p PEG, HTN, DM type II, Hodgkin's lymphoma on chemotherapy, and GERD presents with complaints of shortness of breath over the last 3 to 4 days.  At baseline patient reports that he is on 8 L of oxygen at home, but had been noticing that his oxygenation would drop into the 70s.  Patient reports that his trach cannula keeps getting stopped up with phlegm.  Associated symptoms include fever up to 100.4 F at home, malaise, and wheezing.  He had been eating regular foods unlawful in addition to his tube feeds, but he stopped eating food by mouth 3 days ago as he felt that it was worsening symptoms.  He had just been recently hospitalized from 4/12-4/19 for acute hypercapnic respiratory failure  ED Course: Upon admission into the emergency department patient was seen to be afebrile, pulse 1 15-1 39, respirations 21-42, blood pressures 104/66-135/69, and O2 saturations as low as 84% with improvement on 10 L of high flow nasal cannula oxygen.  Labs significant for CO2 12.2, hemoglobin 12.2, sodium 134, CO2 35, albumin 2.5, high-sensitivity troponin negative x2, and lactic acid within normal limits.  Chest x-ray showed chronic lung disease without definitive acute process, cardiomegaly, lymphadenopathy.  Urinalysis was positive for small leukocytes, rare bacteria, and 11-20 WBCs patient was given 1 L normal saline IV fluids, vancomycin and cefepime.   Subjective: -Patient reports his dyspnea has improved, he denies any fever or chills  overnight.  Assessment & Plan:   Principal Problem:   Sepsis due to pneumonia [facility]) Active Problems:   Hodgkin's lymphoma ([place])   Tracheostomy dependence ([place])   Acute on chronic respiratory failure with hypoxia ([name])   Pulmonary fibrosis ([name])  Acute on chronic hypoxic respiratory failure, tracheostomy dependent . -With underlining chronic, trach dependent in the setting of COVID-19, and possible pneumonitis from Brenttuximab complicated by his Hodgkin's lymphoma treated with current brent rituximab . -PCCM input greatly appreciated, no indication for antibiotics currently, continue with antibiotics. -Continue with steroids given  Wheezing.  Acute on chronic respiratory failure with hypoxia S/p tracheostomy: At baseline patient is on 8 L of oxygen normally, but requiring continued here in the emergency department maintain O2 saturations. -Continuous pulse oximetry with oxygen to maintain O2 saturations greater than 92% -Tracheostomy care  -Albuterol nebs 4 times daily and as needed.  Dehydration:  - Acute.  Present on admission with hemoglobin much higher than his baseline(12.2 on admission, baseline 8-9) - improving with IV fluids.  S/p PEG -Consult dietitian to ensure appropriate nutrition requirements met  Hodgkin's lymphoma:  -Patient currently followed by Dr. Marin Olp of oncology on chemotherapy -Dr. Marin Olp messaged in regards to the patient's admission into the hospital.  Anemia of chronic disease:  -Hemoglobin 12.2 on admission, this is due to hemoconcentration from dehydration, this morning is down to 8.3, which is around his baseline.   -Continue to monitor   DVT prophylaxis: Lovenox Code Status: Full Family Communication:  None at bedside Disposition:   Status is: Inpatient  Remains inpatient appropriate because:IV treatments appropriate due to intensity of illness  or inability to take PO   Dispo: The patient is from: Home              Anticipated d/c  is to: Home              Patient currently is not medically stable to d/c.   Difficult to place patient No       Consultants:   PCCM   Procedures:         Objective: Vitals:   08/13/20 2340 08/14/20 0319 08/14/20 0739 08/14/20 0743  BP: 130/79 135/63    Pulse: (!) 105 100  89  Resp: _0 Temp: 98 F (36.7 C) 97.6 F (36.4 C)    TempSrc: Axillary Axillary    SpO2: 93% 90% 96% 94%  Weight:      Height:        Intake/Output Summary (Last 24 hours) at 08/14/2020 1234 Last data filed at 08/14/2020 0926 Gross per 24 hour  Intake 1200 ml  Output 1475 ml  Net -275 ml   Filed Weights   08/13/20 1233 08/13/20 2024  Weight: 61.2 kg 61.6 kg    Examination:  General exam: Appears calm and comfortable, frail, extremely thin appearing, chronically ill-appearing and deconditioned Respiratory system: Good air entry bilaterally, no use of accessory muscles.  Rhonchi present Cardiovascular system: S1 & S2 heard, RRR. No JVD, murmurs, rubs, gallops or clicks. No pedal edema. Gastrointestinal system: Abdomen is nondistended, soft and nontender.PEG + Central nervous system: Alert and oriented. No focal neurological deficits. Extremities: Symmetric 5 x 5 power. Skin: No rashes, lesions or ulcers Psychiatry: Judgement and insight appear normal. Mood & affect appropriate.     Data Reviewed: I have personally reviewed following labs and imaging studies  CBC: Recent Labs  Lab 08/13/20 1248 08/14/20 0107  WBC 12.2* 12.0*  NEUTROABS 9.2*  --   HGB 12.2* 8.2*  HCT 39.9 26.5*  MCV 88.5 88.6  PLT 150 825    Basic Metabolic Panel: Recent Labs  Lab 08/13/20 1248 08/14/20 0107  NA 134* 137  K 4.3 4.1  CL 93* 98  CO2 35* 32  GLUCOSE 123* 151*  BUN 23 17  CREATININE 0.70 0.64  CALCIUM 9.2 9.3    GFR: Estimated Creatinine Clearance: 81.1 mL/min (by C-G formula based on SCr of 0.64 mg/dL).  Liver Function Tests: Recent Labs  Lab 08/13/20 1248  AST 23  ALT  21  ALKPHOS 66  BILITOT 0.7  PROT 7.4  ALBUMIN 2.5*    CBG: Recent Labs  Lab 08/13/20 2343 08/14/20 0608 08/14/20 1226  GLUCAP 147* 150* 207*     Recent Results (from the past 240 hour(s))  Blood Culture (routine x 2)     Status: None (Preliminary result)   Collection Time: 08/13/20 12:20 PM   Specimen: BLOOD LEFT HAND  Result Value Ref Range Status   Specimen Description BLOOD LEFT HAND  Final   Special Requests   Final    BOTTLES DRAWN AEROBIC AND ANAEROBIC Blood Culture results may not be optimal due to an inadequate volume of blood received in culture bottles   Culture   Final    NO GROWTH < 24 HOURS Performed at [facility], [place] [address] [place], [place] [address]    Report Status PENDING  Incomplete  Blood Culture (routine x 2)     Status: None (Preliminary result)   Collection Time: 08/13/20 12:25 PM   Specimen: BLOOD RIGHT FOREARM  Result Value Ref Range Status   Specimen Description BLOOD RIGHT FOREARM  Final   Special Requests   Final    BOTTLES DRAWN AEROBIC AND ANAEROBIC Blood Culture adequate volume   Culture  Setup Time   Final    GRAM POSITIVE COCCI AEROBIC BOTTLE ONLY Organism ID to follow    Culture   Final    NO GROWTH < 24 HOURS Performed at [facility], [place] [address], [place], [place] [address]    Report Status PENDING  Incomplete  Expectorated Sputum Assessment w Gram Stain, Rflx to Resp Cult     Status: None   Collection Time: 08/13/20 12:31 PM   Specimen: Expectorated Sputum  Result Value Ref Range Status   Specimen Description EXPECTORATED SPUTUM  Final   Special Requests NONE  Final   Sputum evaluation   Final    THIS SPECIMEN IS ACCEPTABLE FOR SPUTUM CULTURE Performed at [facility], [place] [address], [place], [place] [address]    Report Status 08/13/2020 FINAL  Final  Culture, Respiratory w Gram Stain     Status: None (Preliminary result)   Collection Time: 08/13/20 12:31 PM  Result Value Ref Range Status    Specimen Description EXPECTORATED SPUTUM  Final   Special Requests NONE Reflexed from [id number]  Final   Gram Stain   Final    ABUNDANT WBC PRESENT, PREDOMINANTLY PMN MODERATE GRAM POSITIVE RODS RARE GRAM POSITIVE COCCI    Culture   Final    TOO YOUNG TO READ Performed at [facility], [place] [address] [place], [place] [address]    Report Status PENDING  Incomplete  Resp Panel by RT-PCR (Flu A&B, Covid) Nasopharyngeal Swab     Status: None   Collection Time: 08/13/20 12:46 PM   Specimen: Nasopharyngeal Swab; Nasopharyngeal(NP) swabs in vial transport medium  Result Value Ref Range Status   SARS Coronavirus 2 by RT PCR NEGATIVE NEGATIVE Final    Comment: (NOTE) SARS-CoV-2 target nucleic acids are NOT DETECTED.  The SARS-CoV-2 RNA is generally detectable in upper respiratory specimens during the acute phase of infection. The lowest concentration of SARS-CoV-2 viral copies this assay can detect is 138 copies/mL. A negative result does not preclude SARS-Cov-2 infection and should not be used as the sole basis for treatment or other patient management decisions. A negative result may occur with  improper specimen collection/handling, submission of specimen other than nasopharyngeal swab, presence of viral mutation(s) within the areas targeted by this assay, and inadequate number of viral copies(<138 copies/mL). A negative result must be combined with clinical observations, patient history, and epidemiological information. The expected result is Negative.  Fact Sheet for Patients:  EntrepreneurPulse.com.au  Fact Sheet for Healthcare Providers:  IncredibleEmployment.be  This test is no t yet approved or cleared by the [place] FDA and  has been authorized for detection and/or diagnosis of SARS-CoV-2 by FDA under an Emergency Use Authorization (EUA). This EUA will remain  in effect (meaning this test can be used) for the duration of  the COVID-19 declaration under Section 564(b)(1) of the Act, 21 U.S.C.section 360bbb-3(b)(1), unless the authorization is terminated  or revoked sooner.       Influenza A by PCR NEGATIVE NEGATIVE Final   Influenza B by PCR NEGATIVE NEGATIVE Final    Comment: (NOTE) The Xpert Xpress SARS-CoV-2/FLU/RSV plus assay is intended as an aid in the diagnosis of influenza from Nasopharyngeal swab specimens and should not be used as a sole basis  for treatment. Nasal washings and aspirates are unacceptable for Xpert Xpress SARS-CoV-2/FLU/RSV testing.  Fact Sheet for Patients: EntrepreneurPulse.com.au  Fact Sheet for Healthcare Providers: IncredibleEmployment.be  This test is not yet approved or cleared by the [place] FDA and has been authorized for detection and/or diagnosis of SARS-CoV-2 by FDA under an Emergency Use Authorization (EUA). This EUA will remain in effect (meaning this test can be used) for the duration of the COVID-19 declaration under Section 564(b)(1) of the Act, 21 U.S.C. section 360bbb-3(b)(1), unless the authorization is terminated or revoked.  Performed at [facility], [place] [address] [place], [place] [address]   MRSA PCR Screening     Status: None   Collection Time: 08/13/20  6:17 PM   Specimen: Nasal Mucosa; Nasopharyngeal  Result Value Ref Range Status   MRSA by PCR NEGATIVE NEGATIVE Final    Comment:        The GeneXpert MRSA Assay (FDA approved for NASAL specimens only), is one component of a comprehensive MRSA colonization surveillance program. It is not intended to diagnose MRSA infection nor to guide or monitor treatment for MRSA infections. Performed at [facility], [place] [address], [place], [place] [address]          Radiology Studies: DG Chest Port 1 View  Result Date: 08/13/2020 CLINICAL DATA:  Shortness of breath and productive cough for 3-4 days. EXAM: PORTABLE CHEST 1 VIEW  COMPARISON:  Single-view of the chest 07/21/2020, 05/25/2020 and 04/07/2020. FINDINGS: Tracheostomy tube and Port-A-Cath are again seen. Coarse pulmonary opacities in the lung bases and the periphery of the chest bilaterally again seen. No definite new airspace disease. No pneumothorax or pleural effusion. Marked cardiomegaly. Bulky lymphadenopathy is better seen on the prior chest CT. IMPRESSION: Chronic lung disease without definite acute process. Cardiomegaly. Lymphadenopathy is seen on prior CT. Electronically Signed   By: Inge Rise M.D.   On: 08/13/2020 12:59        Scheduled Meds: . Chlorhexidine Gluconate Cloth  6 each Topical Daily  . enoxaparin (LOVENOX) injection  40 mg Subcutaneous Q24H  . feeding supplement (PROSource TF)  45 mL Per Tube Daily  . ipratropium-albuterol  3 mL Nebulization QID  . magnesium oxide  400 mg Oral BID  . methylPREDNISolone (SOLU-MEDROL) injection  125 mg Intravenous Q8H  . multivitamin  15 mL Per Tube Daily  . sertraline  50 mg Oral Daily  . sodium chloride flush  10-40 mL Intracatheter Q12H  . traZODone  100 mg Per Tube QHS   Continuous Infusions: . azithromycin    . cefTRIAXone (ROCEPHIN)  IV 2 g (08/13/20 2118)  . feeding supplement (OSMOLITE 1.2 CAL) 1,000 mL (08/14/20 0348)     LOS: 1 day       Phillips Climes, MD [facility]   To contact the attending provider between 7A-7P or the covering provider during after hours 7P-7A, please log into the web site [email or web address] and access using universal [place] password for that web site. If you do not have the password, please call the hospital operator.  08/14/2020, 12:34 PM

## 2020-08-14 NOTE — Progress Notes (Signed)
Patient received to room. VS as stated below. Patient A&Ox4 and denies pain. Patient requires frequent trach suctioning due to thick secretions. Skin intact with respect to trach and peg tube. Implanted port on right chest not accessed. Head to to completed, oriented to care setting, possessions/call bell in reach.    08/13/20 2024  Vitals  Temp 97.8 F (36.6 C)  Temp Source Axillary  BP 105/73  MAP (mmHg) 83  BP Location Left Arm  BP Method Automatic  Patient Position (if appropriate) Lying  Pulse Rate (!) 109  Pulse Rate Source Monitor  ECG Heart Rate (!) 105  Resp 17  Level of Consciousness  Level of Consciousness Alert  MEWS COLOR  MEWS Score Color Green  Oxygen Therapy  SpO2 95 %  O2 Device Tracheostomy Collar  O2 Flow Rate (L/min) 10 L/min  FiO2 (%) 60 %  Pain Assessment  Pain Scale 0-10  Pain Score 0  PCA/Epidural/Spinal Assessment  Respiratory Pattern Labored;Tachypnea  Height and Weight  Height 5\' 5"  (1.651 m)  Weight 61.6 kg  Type of Scale Used Bed  BSA (Calculated - sq m) 1.68 sq meters  BMI (Calculated) 22.6  Weight in (lb) to have BMI = 25 149.9  Glasgow Coma Scale  Eye Opening 4  Best Verbal Response (NON-intubated) 5  Best Motor Response 6  Glasgow Coma Scale Score 15  MEWS Score  MEWS Temp 0  MEWS Systolic 0  MEWS Pulse 1  MEWS RR 0  MEWS LOC 0  MEWS Score 1

## 2020-08-14 NOTE — Consult Note (Addendum)
Steve [name]  [phone] Fax:([phone]) [place]  Reason for Consultation: Relapsed classical Hodgkin's disease, anemia  HPI: Steve [name] is a 65 year old male with a past medical history significant for chronic tracheostomy after prolonged hospitalization in 2021 with pneumonia due to [id number]-[id number] complicated by arts and pulmonary fibrosis +/- pneumonitis from brentuximab,  status post PEG placement, hypertension, type 2 diabetes mellitus, relapse classical Hodgkin's disease currently on systemic treatment, GERD.  The patient presented to the hospital with shortness of breath over the past 3 to 4 days.  At baseline, the patient reports that he is on 8 L of oxygen at home but has noticed increasing shortness of breath and a drop of his O2 sats into the 70s.  Additionally, he has had increased sputum production through his tracheostomy and a fever up to 100.4, malaise, and wheezing.  On admission, WBC was 12.2, hemoglobin 12.2, ANC 9.2, albumin 2.5.  Respiratory culture showed moderate gram-positive rods and rare gram-positive cocci.  Blood cultures obtained which grew gram-positive cocci in the aerobic bottle only in 1 set of blood cultures.  Sensitivities pending.  He is currently receiving IV Rocephin and azithromycin.  Chest x-ray on admission showed chronic lung disease without definite acute process, cardiomegaly, lymphadenopathy as seen on prior CT.  The patient was resting quietly in his hospital room today.  He did awaken for me but kept his eyes closed during my visit with him.  He was able to answer questions.  The patient has not had a documented fever since admission.  He is not having any chills.  He does report fatigue and weakness.  He reports improvement in his shortness of breath.  Denies chest pain, abdominal pain, nausea, vomiting.  He has not noticed any bleeding.  He is tolerating his tube feeds well.  Medical oncology was asked  see the patient for recommendations regarding his classical Hodgkin's disease and anemia.   Past Medical History:  Diagnosis Date  . Abscess of muscle 08/10/2011   staph infection of right hip   . Acute on chronic respiratory failure with hypoxia ([place])   . Acute respiratory distress syndrome (ARDS) due to COVID-19 virus ([place])   . Anxiety   . Diabetes mellitus without complication ([place])   . GERD (gastroesophageal reflux disease)    rare reflux - no meds for reflux - NO PROBLEM IN PAST SEVERAL YRS  . HCAP (healthcare-associated pneumonia)   . Hip dysplasia, congenital    no surgery as a child for hip dysplasia - has had bilateral hip replacements as an adult  . Hodgkin lymphoma ([facility])   . Hypertension   . Pancreatitis   . Pneumothorax, acute   . Postoperative anemia due to acute blood loss 09/07/2012  . Septic arthritis of hip ([place]) 09/05/2012   PT'S TOTAL HIP JOINT REMOVED - ANTIBIOTIC SPACE PLACED AND PT HAS FINISHED IV ANTIBIOTICS ( PICC LINE REMOVED)  . Sleep apnea    USES CPAP  :  Past Surgical History:  Procedure Laterality Date  . COLONOSCOPY  04/26/2007  . HERNIA REPAIR     inguinal hernia x3  . IR GASTROSTOMY TUBE MOD SED  08/22/2019  . IR IMAGING GUIDED PORT INSERTION  03/29/2019  . JOINT REPLACEMENT  2002 & 2007   bilateral hip replacement  . LYMPH NODE BIOPSY Right 03/20/2019   Procedure: DEEP RIGHT SUPRACLAVICULAR LYMPH NODE EXCISION;  Surgeon: Fanny Skates, MD;  Location: [facility];  Service: General;  Laterality: Right;  . MULTIPLE EXTRACTIONS WITH ALVEOLOPLASTY  07/28/2011   Procedure: MULTIPLE EXTRACION WITH ALVEOLOPLASTY;  Surgeon: Lenn Cal, DDS;  Location: [facility];  Service: Oral Surgery;  Laterality: N/A;  Extraction of tooth #'s 2,3,4,5,6,11,12,13,15,19,22 with alveoloplasty.  . shoulder repair - right for separation of shoulder    . TEE WITHOUT CARDIOVERSION  07/29/2011   Procedure: TRANSESOPHAGEAL ECHOCARDIOGRAM (TEE);  Surgeon: Josue Hector, MD;  Location: [facility];  Service: Cardiovascular;  Laterality: N/A;  . TOTAL HIP REVISION Right 09/05/2012   Procedure: RIGHT HIP RESECTION ARTHROPLASTY WITH ANTIBIOTIC SPACERS;  Surgeon: [name], MD;  Location: [facility];  Service: Orthopedics;  Laterality: Right;  . TOTAL HIP REVISION Right 11/30/2012   Procedure: RIGHT TOTAL HIP ARTHROPLASTY REIMPLANTATION;  Surgeon: [name], MD;  Location: [facility];  Service: Orthopedics;  Laterality: Right;  :  Current Facility-Administered Medications  Medication Dose Route Frequency Provider Last Rate Last Admin  . albuterol (PROVENTIL) (2.5 MG/3ML) 0.083% nebulizer solution 2.5 mg  2.5 mg Nebulization Q2H PRN Smith, Rondell A, MD      . azithromycin (ZITHROMAX) 500 mg in sodium chloride 0.9 % 250 mL IVPB  500 mg Intravenous Q24H Smith, Rondell A, MD      . calamine lotion 1 application  1 application Topical Daily PRN Smith, Rondell A, MD      . cefTRIAXone (ROCEPHIN) 2 g in sodium chloride 0.9 % 100 mL IVPB  2 g Intravenous Q24H Smith, Rondell A, MD 200 mL/hr at 08/13/20 2118 2 g at 08/13/20 2118  . chlorhexidine (PERIDEX) 0.12 % solution 15 mL  15 mL Mouth Rinse BID PRN Fuller Plan A, MD      . Chlorhexidine Gluconate Cloth 2 % PADS 6 each  6 each Topical Daily Norval Morton, MD   6 each at 08/14/20 [phone]  . enoxaparin (LOVENOX) injection 40 mg  40 mg Subcutaneous Q24H Fuller Plan A, MD   40 mg at 08/13/20 2115  . feeding supplement (OSMOLITE 1.2 CAL) liquid 1,000 mL  1,000 mL Per Tube Continuous Etta Quill, DO 40 mL/hr at 08/14/20 0348 1,000 mL at 08/14/20 0348  . feeding supplement (PROSource TF) liquid 45 mL  45 mL Per Tube Daily Fuller Plan A, MD   45 mL at 08/14/20 0917  . guaiFENesin (ROBITUSSIN) 100 MG/5ML solution 100 mg  5 mL Per Tube Q4H PRN Fuller Plan A, MD      . hyoscyamine (ANASPAZ) disintergrating tablet 0.125 mg  0.125 mg Oral Q4H PRN Smith, Rondell A, MD      . ipratropium-albuterol (DUONEB)  0.5-2.5 (3) MG/3ML nebulizer solution 3 mL  3 mL Nebulization QID Fuller Plan A, MD   3 mL at 08/14/20 0739  . lidocaine-prilocaine (EMLA) cream 1 application  1 application Topical Daily PRN Smith, Rondell A, MD      . loperamide HCl (IMODIUM) 1 MG/7.5ML suspension 2 mg  2 mg Per Tube Daily PRN Tamala Julian, Rondell A, MD      . LORazepam (ATIVAN) tablet 0.5 mg  0.5 mg Oral Q6H PRN Smith, Rondell A, MD      . magnesium oxide (MAG-OX) tablet 400 mg  400 mg Oral BID Fuller Plan A, MD   400 mg at 08/14/20 0917  . methylPREDNISolone sodium succinate (SOLU-MEDROL) 125 mg/2 mL injection 125 mg  125 mg Intravenous Q8H Smith, Rondell A, MD   125 mg at 08/14/20 0612  . multivitamin liquid 15 mL  15 mL  Per Tube Daily Fuller Plan A, MD   15 mL at 08/14/20 0920  . sertraline (ZOLOFT) tablet 50 mg  50 mg Oral Daily Fuller Plan A, MD   50 mg at 08/14/20 0917  . sodium chloride flush (NS) 0.9 % injection 10-40 mL  10-40 mL Intracatheter Q12H Smith, Rondell A, MD   10 mL at 08/14/20 0918  . sodium chloride flush (NS) 0.9 % injection 10-40 mL  10-40 mL Intracatheter PRN Fuller Plan A, MD      . traZODone (DESYREL) tablet 100 mg  100 mg Per Tube QHS Tamala Julian, Rondell A, MD   100 mg at 08/13/20 2114  . triamcinolone cream (KENALOG) 0.1 % cream 1 application  1 application Topical BID PRN Norval Morton, MD         No Known Allergies:  Family History  Problem Relation Age of Onset  . Melanoma Mother   . Heart attack Mother   . Hyperlipidemia Neg Hx   . Sudden death Neg Hx   . Hypertension Neg Hx   . Diabetes Neg Hx   :  Social History   Socioeconomic History  . Marital status: Married    Spouse name: Not on file  . Number of children: Not on file  . Years of education: Not on file  . Highest education level: Not on file  Occupational History  . Not on file  Tobacco Use  . Smoking status: Never Smoker  . Smokeless tobacco: Never Used  Vaping Use  . Vaping Use: Never used  Substance and  Sexual Activity  . Alcohol use: Yes    Comment: rare beer. 5 times a year at most. 2 beers when he drinks.  . Drug use: No  . Sexual activity: Yes    Partners: Female  Other Topics Concern  . 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   Welder/fabricator   2 caffeinated beverages daily   Social Determinants of Health   Financial Resource Strain: Not on file  Food Insecurity: Not on file  Transportation Needs: Not on file  Physical Activity: Not on file  Stress: Not on file  Social Connections: Not on file  Intimate Partner Violence: Not on file  :  Review of Systems: A comprehensive 14 point review of systems was negative except as noted in the HPI.  Exam: Patient Vitals for the past 24 hrs:  BP Temp Temp src Pulse Resp SpO2 Height Weight  08/14/20 0743 -- -- -- 89 20 94 % -- --  08/14/20 0739 -- -- -- -- -- 96 % -- --  08/14/20 0319 135/63 97.6 F (36.4 C) Axillary 100 20 90 % -- --  08/13/20 2340 130/79 98 F (36.7 C) Axillary (!) 105 20 93 % -- --  08/13/20 2024 105/73 97.8 F (36.6 C) Axillary (!) 109 17 95 % 5\' 5"  (1.651 m) 61.6 kg  08/13/20 2013 -- -- -- -- -- 91 % -- --  08/13/20 1930 106/65 98.5 F (36.9 C) Oral (!) 125 20 93 % -- --  08/13/20 1900 115/61 -- -- (!) 115 (!) 27 90 % -- --  08/13/20 1800 (!) 107/56 -- -- (!) 126 (!) 42 -- -- --  08/13/20 1745 110/63 -- -- (!) 134 (!) 30 91 % -- --  08/13/20 1730 128/66 -- -- (!) 135 (!) 21 93 % -- --  08/13/20 1715 104/68 -- -- (!) 117 (!) 29 94 % -- --  08/13/20 1700 104/66 -- -- (!) 121 (!) 31 93 % -- --  08/13/20 1645 135/69 -- -- (!) 130 (!) 25 (!) 84 % -- --  08/13/20 1630 119/69 -- -- (!) 127 (!) 24 94 % -- --  08/13/20 1615 114/65 -- -- (!) 119 (!) 26 92 % -- --  08/13/20 1612 118/64 -- -- (!) 125 (!) 29 91 % -- --  08/13/20 1600 118/64 -- -- (!) 139 (!) 28 90 % -- --  08/13/20 1545 113/71 -- -- (!) 128 (!) 34 93 % -- --  08/13/20 1530 118/72 -- -- (!) 133 (!) 27 91 % -- --   08/13/20 1515 116/68 -- -- (!) 118 (!) 35 94 % -- --  08/13/20 1500 121/76 -- -- (!) 123 (!) 34 95 % -- --  08/13/20 1445 123/72 -- -- (!) 119 (!) 32 96 % -- --  08/13/20 1430 116/76 -- -- (!) 123 (!) 25 92 % -- --  08/13/20 1415 111/72 -- -- (!) 121 (!) 29 91 % -- --  08/13/20 1400 119/80 -- -- (!) 123 (!) 29 93 % -- --  08/13/20 1345 119/67 -- -- (!) 121 (!) 30 95 % -- --  08/13/20 1330 116/64 -- -- (!) 118 -- 96 % -- --  08/13/20 1315 110/68 -- -- (!) 136 -- 94 % -- --  08/13/20 1300 (!) 114/59 -- -- (!) 117 (!) 29 93 % -- --  08/13/20 1245 112/67 -- -- (!) 123 (!) 30 91 % -- --  08/13/20 1233 -- -- -- (!) 116 (!) 30 98 % 5' 6.5" (1.689 m) 61.2 kg  08/13/20 1230 117/69 -- -- (!) 115 (!) 39 100 % -- --  08/13/20 1215 114/67 -- -- (!) 116 (!) 40 98 % -- --    General: Chronically ill-appearing male, no distress Eyes:  no scleral icterus.   ENT:  There were no oropharyngeal lesions.   Neck: Trachea midline.   Respiratory: Coarse rhonchi with expiratory wheezing. Cardiovascular:  Regular rate and rhythm, S1/S2, without murmur, rub or gallop.  There was no pedal edema.   GI:  abdomen was soft, flat, nontender, nondistended, without organomegaly.  Feeding tube without surrounding erythema or drainage. Musculoskeletal: Strength symmetrical in the upper and lower extremities. Skin: He has a few scattered ecchymoses, no petechiae. Neuro exam was nonfocal. Patient was alert and oriented.  Attention was good.   Language was appropriate.  Mood was normal without depression.  Speech was not pressured.  Thought content was not tangential.     Lab Results  Component Value Date   WBC 12.0 (H) 08/14/2020   HGB 8.2 (L) 08/14/2020   HCT 26.5 (L) 08/14/2020   PLT 176 08/14/2020   GLUCOSE 151 (H) 08/14/2020   CHOL 109 02/19/2019   TRIG 71 07/29/2019   HDL 26.50 (L) 02/19/2019   LDLDIRECT 131.5 09/12/2011   LDLCALC 63 02/19/2019   ALT 21 08/13/2020   AST 23 08/13/2020   NA 137 08/14/2020    K 4.1 08/14/2020   CL 98 08/14/2020   CREATININE 0.64 08/14/2020   BUN 17 08/14/2020   CO2 32 08/14/2020    CT CHEST WO CONTRAST  Result Date: 07/22/2020 CLINICAL DATA:  Cough, respiratory failure. EXAM: CT CHEST WITHOUT CONTRAST TECHNIQUE: Multidetector CT imaging of the chest was performed following the standard protocol without IV contrast. COMPARISON:  05/25/2020.  PET 06/19/2020. FINDINGS: Cardiovascular: Left IJ Port-A-Cath terminates in the right atrium. Atherosclerotic  calcification of the aorta. Pulmonic trunk and heart are enlarged. No pericardial effusion. Mediastinum/Nodes: Bulky low internal jugular mediastinal and prepericardiac/juxtadiaphragmatic adenopathy. Index low right paratracheal lymph node measures 3.3 cm, as on 05/25/2020. Hilar regions are difficult to evaluate without IV contrast. No axillary adenopathy. Esophagus is grossly unremarkable. Lungs/Pleura: Tracheostomy is in place. Image quality is markedly degraded by expiratory phase imaging. Underlying pattern of severe pulmonary fibrosis with superimposed peribronchovascular nodularity and consolidation as well as dependent atelectasis, right greater than left. Thin rind of pleural fluid in the right hemithorax, new. Upper Abdomen: Visualized portion of the liver is unremarkable. Stones in the gallbladder. Adrenal glands are unremarkable. There may be right caliectasis, incompletely visualized. Visualized portions of the kidneys, spleen, pancreas, stomach and bowel otherwise unremarkable. Musculoskeletal: Degenerative changes in the spine. No worrisome lytic or sclerotic lesions. IMPRESSION: 1. Image quality is degraded by expiratory phase imaging. New peribronchovascular nodularity/consolidation, indicative of an infectious bronchiolitis/bronchopneumonia, superimposed on severe pulmonary fibrosis. 2. New thin rind of right pleural fluid. 3. Bulky intrathoracic adenopathy and osseous metastatic disease, better evaluated on PET  06/19/2020. 4. Cholelithiasis. 5. Difficult to exclude mild caliectasis in the right kidney, incompletely visualized. 6.  Aortic atherosclerosis (ICD10-I70.0). 7. Enlarged pulmonic trunk, indicative of pulmonary arterial hypertension. Electronically Signed   By: Lorin Picket M.D.   On: 07/22/2020 08:09   DG Chest Port 1 View  Result Date: 08/13/2020 CLINICAL DATA:  Shortness of breath and productive cough for 3-4 days. EXAM: PORTABLE CHEST 1 VIEW COMPARISON:  Single-view of the chest 07/21/2020, 05/25/2020 and 04/07/2020. FINDINGS: Tracheostomy tube and Port-A-Cath are again seen. Coarse pulmonary opacities in the lung bases and the periphery of the chest bilaterally again seen. No definite new airspace disease. No pneumothorax or pleural effusion. Marked cardiomegaly. Bulky lymphadenopathy is better seen on the prior chest CT. IMPRESSION: Chronic lung disease without definite acute process. Cardiomegaly. Lymphadenopathy is seen on prior CT. Electronically Signed   By: Inge Rise M.D.   On: 08/13/2020 12:59   DG CHEST PORT 1 VIEW  Result Date: 07/21/2020 CLINICAL DATA:  Hypoxemia. Recent COVID with ARDS and pulmonary fibrosis. EXAM: PORTABLE CHEST 1 VIEW COMPARISON:  Radiograph earlier today. FINDINGS: Tracheostomy tube tip at the thoracic inlet. Accessed left chest port in place. Persistent low lung volumes. Stable heart size and mediastinal contours. Again seen elevation of right hemidiaphragm. Coarse bilateral lung opacities are not significantly changed from earlier today. No pneumothorax or large pleural effusion. IMPRESSION: 1. Persistent low lung volumes. Coarse bilateral lung opacities are not significantly changed from earlier today, likely representing infectious or inflammatory process superimposed on pulmonary fibrosis. 2. Tracheostomy tube tip at the thoracic inlet. Electronically Signed   By: Keith Rake M.D.   On: 07/21/2020 15:35   DG Chest Port 1 View  Result Date:  07/21/2020 CLINICAL DATA:  Dyspnea EXAM: PORTABLE CHEST 1 VIEW COMPARISON:  07/14/2020 FINDINGS: Tracheostomy and left internal jugular chest port with its tip within the right atrium are unchanged. Lung volumes are extremely small with asymmetric right-sided volume loss with elevation of the right hemidiaphragm, however, pulmonary insufflation remain stable since prior examination. Superimposed coarse pulmonary infiltrates appears slightly improved since prior examination, likely infectious or inflammatory in nature. No pneumothorax or pleural effusion. Cardiac size is within normal limits when accounting for poor pulmonary insufflation. Bilateral hilar enlargement is unchanged. IMPRESSION: Stable pulmonary hypoinflation. Slight interval improvement in bilateral coarse pulmonary infiltrates, likely infectious or inflammatory in nature. Superimposed peripheral pulmonary infiltrates in keeping with underlying  interstitial lung disease better appreciated on prior CT examination of 05/25/2020 are again noted. Electronically Signed   By: Fidela Salisbury MD   On: 07/21/2020 01:23     CT CHEST WO CONTRAST  Result Date: 07/22/2020 CLINICAL DATA:  Cough, respiratory failure. EXAM: CT CHEST WITHOUT CONTRAST TECHNIQUE: Multidetector CT imaging of the chest was performed following the standard protocol without IV contrast. COMPARISON:  05/25/2020.  PET 06/19/2020. FINDINGS: Cardiovascular: Left IJ Port-A-Cath terminates in the right atrium. Atherosclerotic calcification of the aorta. Pulmonic trunk and heart are enlarged. No pericardial effusion. Mediastinum/Nodes: Bulky low internal jugular mediastinal and prepericardiac/juxtadiaphragmatic adenopathy. Index low right paratracheal lymph node measures 3.3 cm, as on 05/25/2020. Hilar regions are difficult to evaluate without IV contrast. No axillary adenopathy. Esophagus is grossly unremarkable. Lungs/Pleura: Tracheostomy is in place. Image quality is markedly degraded by  expiratory phase imaging. Underlying pattern of severe pulmonary fibrosis with superimposed peribronchovascular nodularity and consolidation as well as dependent atelectasis, right greater than left. Thin rind of pleural fluid in the right hemithorax, new. Upper Abdomen: Visualized portion of the liver is unremarkable. Stones in the gallbladder. Adrenal glands are unremarkable. There may be right caliectasis, incompletely visualized. Visualized portions of the kidneys, spleen, pancreas, stomach and bowel otherwise unremarkable. Musculoskeletal: Degenerative changes in the spine. No worrisome lytic or sclerotic lesions. IMPRESSION: 1. Image quality is degraded by expiratory phase imaging. New peribronchovascular nodularity/consolidation, indicative of an infectious bronchiolitis/bronchopneumonia, superimposed on severe pulmonary fibrosis. 2. New thin rind of right pleural fluid. 3. Bulky intrathoracic adenopathy and osseous metastatic disease, better evaluated on PET 06/19/2020. 4. Cholelithiasis. 5. Difficult to exclude mild caliectasis in the right kidney, incompletely visualized. 6.  Aortic atherosclerosis (ICD10-I70.0). 7. Enlarged pulmonic trunk, indicative of pulmonary arterial hypertension. Electronically Signed   By: Lorin Picket M.D.   On: 07/22/2020 08:09   DG Chest Port 1 View  Result Date: 08/13/2020 CLINICAL DATA:  Shortness of breath and productive cough for 3-4 days. EXAM: PORTABLE CHEST 1 VIEW COMPARISON:  Single-view of the chest 07/21/2020, 05/25/2020 and 04/07/2020. FINDINGS: Tracheostomy tube and Port-A-Cath are again seen. Coarse pulmonary opacities in the lung bases and the periphery of the chest bilaterally again seen. No definite new airspace disease. No pneumothorax or pleural effusion. Marked cardiomegaly. Bulky lymphadenopathy is better seen on the prior chest CT. IMPRESSION: Chronic lung disease without definite acute process. Cardiomegaly. Lymphadenopathy is seen on prior CT.  Electronically Signed   By: Inge Rise M.D.   On: 08/13/2020 12:59   DG CHEST PORT 1 VIEW  Result Date: 07/21/2020 CLINICAL DATA:  Hypoxemia. Recent COVID with ARDS and pulmonary fibrosis. EXAM: PORTABLE CHEST 1 VIEW COMPARISON:  Radiograph earlier today. FINDINGS: Tracheostomy tube tip at the thoracic inlet. Accessed left chest port in place. Persistent low lung volumes. Stable heart size and mediastinal contours. Again seen elevation of right hemidiaphragm. Coarse bilateral lung opacities are not significantly changed from earlier today. No pneumothorax or large pleural effusion. IMPRESSION: 1. Persistent low lung volumes. Coarse bilateral lung opacities are not significantly changed from earlier today, likely representing infectious or inflammatory process superimposed on pulmonary fibrosis. 2. Tracheostomy tube tip at the thoracic inlet. Electronically Signed   By: Keith Rake M.D.   On: 07/21/2020 15:35   DG Chest Port 1 View  Result Date: 07/21/2020 CLINICAL DATA:  Dyspnea EXAM: PORTABLE CHEST 1 VIEW COMPARISON:  07/14/2020 FINDINGS: Tracheostomy and left internal jugular chest port with its tip within the right atrium are unchanged. Lung volumes are extremely  small with asymmetric right-sided volume loss with elevation of the right hemidiaphragm, however, pulmonary insufflation remain stable since prior examination. Superimposed coarse pulmonary infiltrates appears slightly improved since prior examination, likely infectious or inflammatory in nature. No pneumothorax or pleural effusion. Cardiac size is within normal limits when accounting for poor pulmonary insufflation. Bilateral hilar enlargement is unchanged. IMPRESSION: Stable pulmonary hypoinflation. Slight interval improvement in bilateral coarse pulmonary infiltrates, likely infectious or inflammatory in nature. Superimposed peripheral pulmonary infiltrates in keeping with underlying interstitial lung disease better appreciated on  prior CT examination of 05/25/2020 are again noted. Electronically Signed   By: Fidela Salisbury MD   On: 07/21/2020 01:23   Assessment and Plan:  1.  Relapsed classical Hodgkin's disease 2.  Anemia 3.  Acute on chronic hypoxic respiratory failure 4.  Tracheostomy dependent 5.  Status post PEG tube placement 6.  Hypertension 7.  Diabetes mellitus type 2  -The patient has received 2 cycles of Adcetris for his Hodgkin's disease.  Unfortunately, he has been admitted for a lung infection following full cycles of treatment.  Further treatment recommendations per Dr. Marin Olp. -Hemoglobin today is 8.2.  This is consistent with his baseline.  He has a component of anemia secondary to chronic disease as well as iron deficiency.  He had a low iron and percent saturation level noted on his iron panel performed just over a week ago.  Recommend ferrous sulfate 325 mg twice daily. -Antibiotics and steroids per pulmonology and hospitalist.  Thank you for this referral.   Mikey Bussing, DNP, AGPCNP-BC, AOCNP   ASSESSMENT: I agree with the above assessment by Erasmo Downer.  I just have to wonder somehow the fact that he gets admitted after he gets treated could be a problem with respect to the treatment.  Adcetris typically does not cause any issues with respect to infections.  Looks like on his CT scan there is still bulky adenopathy.  I just wonder if we may have to "switch gears" and think about another avenue for treatment.  He is definitely not a candidate for systemic chemotherapy.  He is quite anemic.  He may benefit from a blood transfusion.  I would have to see how his hemoglobin trends.  He has a very low erythropoietin level.  I suspect he will need ESA.  I will give him some IV iron.  I would give him a dose of Aranesp.  This is quite complicated.  I really hate that his quality of life is compromised because of the admissions.  I realize he has horrible underlying lung disease.  I know this  does not help the situation at all.  We will certainly try to help out in any way that we can.    Lattie Haw, MD  Psalms 27:14

## 2020-08-14 NOTE — Progress Notes (Addendum)
Initial Nutrition Assessment  DOCUMENTATION CODES:   Severe malnutrition in context of chronic illness  INTERVENTION:   Continue tube feeding via PEG: Osmolite 1.2 at 75 ml/h (1800 ml per day) Prosource TF 45 ml once daily  Provides 2200 kcal, 111 gm protein, 1476 ml free water daily  Free water flushes 180 ml QID for a total of 2196 ml free water daily  NUTRITION DIAGNOSIS:   Severe Malnutrition related to chronic illness (pulmonary fibrosis, Hodgkin's lymphoma) as evidenced by severe muscle depletion,severe fat depletion.  GOAL:   Patient will meet greater than or equal to 90% of their needs  MONITOR:   TF tolerance,Labs,Skin,Diet advancement  REASON FOR ASSESSMENT:   Consult Enteral/tube feeding initiation and management  ASSESSMENT:   65 yo male admitted with SOB. PMH includes chronic trach, COVID-19 ARDS, pulmonary fibrosis, PEG, HTN, DM-2, Hodgkin's lymphoma on chemo, GERD, pancreatitis.   Spoke with patient and his sister at bedside. Patient was eating at home. He is afraid that he aspirated and that's why he has PNA. He was also receiving continuous TF via PEG. Even when he was eating, he still continued his tube feeding at goal rate. Unsure of tube feeding rate at home. Sister reports he had PNA a few weeks ago and he was admitted to [facility]. That episode happened 10 days after receiving his immunotherapy treatment, and it was ~10 days after his most recent treatment that he developed PNA again.   Remains NPO. Suggest swallow evaluation prior to advancing diet.   Currently receiving Osmolite 1.2 at 40 ml/h via PEG with Prosource TF 45 ml daily. This provides 1192 kcal, 64 gm protein, 787 ml free water daily.   Labs reviewed.  CBG: 147-150  Medications reviewed and include Mag-Ox, solumedrol, liquid MVI.  Weight history reviewed. Weight has been fairly stable since January 2022. No significant weight changes.   NUTRITION - FOCUSED PHYSICAL EXAM:  Flowsheet Row  Most Recent Value  Orbital Region Severe depletion  Upper Arm Region Moderate depletion  Thoracic and Lumbar Region Severe depletion  Buccal Region Severe depletion  Temple Region Severe depletion  Clavicle Bone Region Severe depletion  Clavicle and Acromion Bone Region Severe depletion  Scapular Bone Region Moderate depletion  Dorsal Hand Moderate depletion  Patellar Region Severe depletion  Anterior Thigh Region Severe depletion  Posterior Calf Region Severe depletion  Edema (RD Assessment) None  Hair Reviewed  Eyes Reviewed  Mouth Reviewed  Skin Reviewed  Nails Reviewed       Diet Order:   Diet Order    None      EDUCATION NEEDS:   Not appropriate for education at this time  Skin:  Skin Assessment: Skin Integrity Issues: Skin Integrity Issues:: Stage II Stage II: buttocks (from previous admission)   Last BM:  no BM documented  Height:   Ht Readings from Last 1 Encounters:  08/13/20 5\' 5"  (1.651 m)    Weight:   Wt Readings from Last 1 Encounters:  08/13/20 61.6 kg     BMI:  Body mass index is 22.6 kg/m.  Estimated Nutritional Needs:   Kcal:  1950-2150  Protein:  100-120 gm  Fluid:  >/= 2 L    Lucas Mallow, RD, LDN, CNSC Please refer to Amion for contact information.

## 2020-08-14 NOTE — TOC Initial Note (Signed)
Transition of Care [facility]) - Initial/Assessment Note    Patient Details  Name: Steve [name] MRN: [id number] Date of Birth: 10-Jan-1956  [facility] CM/SW Contact:    Pollie Friar, RN Phone Number: 08/14/2020, 11:54 AM  Clinical Narrative:                 Pt is from home with spouse. He states he has all needed supplies at home for his [name] and PeG. He has home oxygen through [employer]. He denies any transportation issues. Waiting on PT/OT to eval.  TOC following.  Expected Discharge Plan: [place] Barriers to Discharge: Continued Medical Work up   Patient Goals and CMS Choice     Choice offered to / list presented to : [facility]  Expected Discharge Plan and Services Expected Discharge Plan: [place]   Discharge Planning Services: CM Consult   Living arrangements for the past 2 months: Mobile Home                                      Prior Living Arrangements/Services Living arrangements for the past 2 months: Mobile Home Lives with:: Spouse Patient language and need for interpreter reviewed:: Yes Do you feel safe going back to the place where you live?: Yes        Care giver support system in place?: Yes (comment)   Criminal Activity/Legal Involvement Pertinent to Current Situation/Hospitalization: No - Comment as needed  Activities of Daily Living Home Assistive Devices/Equipment: Hospital bed,Vent/Trach supplies,Feeding equipment,Nebulizer,Oxygen,Other (Comment),CBG Meter,Walker (specify type),Wheelchair ADL Screening (condition at time of admission) Patient's cognitive ability adequate to safely complete daily activities?: Yes Is the patient deaf or have difficulty hearing?: No Does the patient have difficulty seeing, even when wearing glasses/contacts?: No Does the patient have difficulty concentrating, remembering, or making decisions?: No Patient able to express need for assistance with  ADLs?: Yes Does the patient have difficulty dressing or bathing?: Yes Independently performs ADLs?: No Communication: Needs assistance Is this a change from baseline?: Pre-admission baseline Dressing (OT): Needs assistance Is this a change from baseline?: Pre-admission baseline Grooming: Needs assistance Is this a change from baseline?: Pre-admission baseline Feeding: Needs assistance Is this a change from baseline?: Pre-admission baseline Bathing: Needs assistance Is this a change from baseline?: Pre-admission baseline Toileting: Needs assistance Is this a change from baseline?: Pre-admission baseline In/Out Bed: Needs assistance Is this a change from baseline?: Pre-admission baseline Walks in Home: Dependent Is this a change from baseline?: Pre-admission baseline Does the patient have difficulty walking or climbing stairs?: Yes Weakness of Legs: Both Weakness of Arms/Hands: None  Permission Sought/Granted                  Emotional Assessment Appearance:: Appears stated age Attitude/Demeanor/Rapport: Engaged Affect (typically observed): Accepting Orientation: : Oriented to Self,Oriented to Place,Oriented to  Time,Oriented to Situation   Psych Involvement: No (comment)  Admission diagnosis:  SIRS (systemic inflammatory response syndrome) ([name]) [R65.10] Acute on chronic respiratory failure with hypoxia ([name]) [J96.21] Leukocytosis, unspecified type [D72.829] Community acquired pneumonia, unspecified laterality [J18.9] Patient Active Problem List   Diagnosis Date Noted  . Sepsis due to pneumonia ([place]) 08/13/2020  . Acute on chronic respiratory failure with hypoxia and hypercapnia ([place]) 07/21/2020  . DM (diabetes mellitus), type 2 ([place]) 07/15/2020  . Pneumonia 07/14/2020  . Pulmonary fibrosis ([place]) 05/26/2020  . Bronchiectasis ([place])  05/26/2020  . Acute hypercapnic respiratory failure ([place]) 05/26/2020  . Acute on chronic respiratory failure with hypoxia ([place])  05/25/2020  . Uncontrolled type 2 diabetes mellitus with hyperglycemia, with long-term current use of insulin ([place]) 05/25/2020  . Essential hypertension 05/25/2020  . GERD without esophagitis 05/25/2020  . Debility   . Anxiety   . Tracheostomy care ([place])   . Pneumothorax on right 03/23/2020  . Chronic respiratory failure with hypoxia ([place])   . ARDS (adult respiratory distress syndrome) ([place])   . Hodgkin's lymphoma ([place])   . Pneumothorax, acute   . Healthcare-associated pneumonia   . COVID-19 virus infection   . On mechanically assisted ventilation ([place])   . Dysphagia   . Pneumothorax   . Subcutaneous air ([place])   . Tracheostomy dependence ([place])   . Pneumomediastinum ([place])   . Acute respiratory distress syndrome (ARDS) due to COVID-19 virus ([place])   . Pressure injury of skin 07/27/2019  . Acute respiratory failure with hypoxemia ([place])   . 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 Malnutrition of moderate degree 07/10/2019  . HCAP (healthcare-associated pneumonia)   . Hypoxia   . Febrile neutropenia ([place]) 05/17/2019  . COVID-19 05/17/2019  . Hyponatremia 05/17/2019  . Protein-calorie malnutrition, severe ([place]) 05/17/2019  . Neutropenic fever ([place]) 05/16/2019  . 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 Chemotherapy-induced diarrhea 04/22/2019  . Anemia due to antineoplastic chemotherapy 03/25/2019  . Hodgkin's lymphoma ([place]) 02/25/2019  . Abnormal CT scan, pancreas - tail area 01/31/2013  . 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 Septic arthritis of hip ([place]) 09/05/2012  . Routine general medical examination at a health care facility 09/12/2011  . Elbow pain 09/12/2011  . Abscess of muscle 08/10/2011  . H/O dental abscess 08/10/2011  . Staphylococcus aureus bacteremia 07/27/2011  . Abdominal pain, RLQ 09/02/2010  . HYPOGONADISM, MALE 03/23/2007  . SLEEP APNEA 03/23/2007   PCP:  [name], PA-C Pharmacy:   [id number] -  [place], [address] [place] [address] Phone: [phone] Fax: [phone]     Social Determinants of Health (SDOH) Interventions    Readmission Risk Interventions Readmission Risk Prevention Plan 07/22/2019  Transportation Screening Complete  Medication Review (RN Care Manager) Complete  Some recent data might be hidden

## 2020-08-15 ENCOUNTER — Inpatient Hospital Stay (HOSPITAL_COMMUNITY): Payer: BC Managed Care – PPO

## 2020-08-15 DIAGNOSIS — A419 Sepsis, unspecified organism: Secondary | ICD-10-CM

## 2020-08-15 DIAGNOSIS — D638 Anemia in other chronic diseases classified elsewhere: Secondary | ICD-10-CM | POA: Diagnosis not present

## 2020-08-15 DIAGNOSIS — J9621 Acute and chronic respiratory failure with hypoxia: Secondary | ICD-10-CM | POA: Diagnosis not present

## 2020-08-15 DIAGNOSIS — J189 Pneumonia, unspecified organism: Principal | ICD-10-CM

## 2020-08-15 LAB — CULTURE, BLOOD (ROUTINE X 2): Special Requests: ADEQUATE

## 2020-08-15 LAB — CBC
HCT: 26.2 % — ABNORMAL LOW (ref 39.0–52.0)
Hemoglobin: 7.8 g/dL — ABNORMAL LOW (ref 13.0–17.0)
MCH: 27.4 pg (ref 26.0–34.0)
MCHC: 29.8 g/dL — ABNORMAL LOW (ref 30.0–36.0)
MCV: 91.9 fL (ref 80.0–100.0)
Platelets: 163 10*3/uL (ref 150–400)
RBC: 2.85 MIL/uL — ABNORMAL LOW (ref 4.22–5.81)
RDW: 18.1 % — ABNORMAL HIGH (ref 11.5–15.5)
WBC: 14.4 10*3/uL — ABNORMAL HIGH (ref 4.0–10.5)
nRBC: 0 % (ref 0.0–0.2)

## 2020-08-15 LAB — GLUCOSE, CAPILLARY
Glucose-Capillary: 201 mg/dL — ABNORMAL HIGH (ref 70–99)
Glucose-Capillary: 204 mg/dL — ABNORMAL HIGH (ref 70–99)
Glucose-Capillary: 235 mg/dL — ABNORMAL HIGH (ref 70–99)
Glucose-Capillary: 242 mg/dL — ABNORMAL HIGH (ref 70–99)
Glucose-Capillary: 249 mg/dL — ABNORMAL HIGH (ref 70–99)
Glucose-Capillary: 262 mg/dL — ABNORMAL HIGH (ref 70–99)

## 2020-08-15 LAB — BASIC METABOLIC PANEL
Anion gap: 5 (ref 5–15)
BUN: 17 mg/dL (ref 8–23)
CO2: 36 mmol/L — ABNORMAL HIGH (ref 22–32)
Calcium: 9.1 mg/dL (ref 8.9–10.3)
Chloride: 100 mmol/L (ref 98–111)
Creatinine, Ser: 0.55 mg/dL — ABNORMAL LOW (ref 0.61–1.24)
GFR, Estimated: >60 mL/min (ref >60)
Glucose, Bld: 212 mg/dL — ABNORMAL HIGH (ref 70–99)
Potassium: 3.8 mmol/L (ref 3.5–5.1)
Sodium: 141 mmol/L (ref 135–145)

## 2020-08-15 LAB — URINE CULTURE: Culture: 5000 — AB

## 2020-08-15 MED ORDER — DARBEPOETIN ALFA 300 MCG/0.6ML IJ SOSY
300.0000 ug | PREFILLED_SYRINGE | Freq: Once | INTRAMUSCULAR | Status: AC
  Administered 2020-08-15: 300 ug via SUBCUTANEOUS
  Filled 2020-08-15: qty 0.6

## 2020-08-15 MED ORDER — SODIUM CHLORIDE 0.9 % IV SOLN
510.0000 mg | Freq: Once | INTRAVENOUS | Status: AC
  Administered 2020-08-15: 510 mg via INTRAVENOUS
  Filled 2020-08-15 (×2): qty 17

## 2020-08-15 MED ORDER — ENOXAPARIN SODIUM 40 MG/0.4ML IJ SOSY
40.0000 mg | PREFILLED_SYRINGE | INTRAMUSCULAR | Status: DC
  Administered 2020-08-16 – 2020-08-24 (×9): 40 mg via SUBCUTANEOUS
  Filled 2020-08-15 (×10): qty 0.4

## 2020-08-15 MED ORDER — FUROSEMIDE 10 MG/ML IJ SOLN
20.0000 mg | Freq: Once | INTRAMUSCULAR | Status: AC
  Administered 2020-08-15: 20 mg via INTRAVENOUS
  Filled 2020-08-15: qty 2

## 2020-08-15 NOTE — Progress Notes (Signed)
PROGRESS NOTE    Steve [name]  ZDG:[id number] DOB: 1955/09/05 DOA: 08/13/2020 PCP: [name], PA-C     Chief Complaint  Patient presents with  . Shortness of Breath    Brief Narrative:    Steve [name] is a 65 y.o. male with medical history significant of chronic tracheostomy after prolonged hospitalization in 2021 related with pneumonia due to [id number] complicated by ARDS and pulmonary fibrosis +/-pneumonitis from brentuximab, s/p PEG, HTN, DM type II, Hodgkin's lymphoma on chemotherapy, and GERD presents with complaints of shortness of breath over the last 3 to 4 days.  At baseline patient reports that he is on 8 L of oxygen at home, but had been noticing that his oxygenation would drop into the 70s.  Patient reports that his trach cannula keeps getting stopped up with phlegm.  Associated symptoms include fever up to 100.4 F at home, malaise, and wheezing.  He had been eating regular foods unlawful in addition to his tube feeds, but he stopped eating food by mouth 3 days ago as he felt that it was worsening symptoms.  He had just been recently hospitalized from 4/12-4/19 for acute hypercapnic respiratory failure  ED Course: Upon admission into the emergency department patient was seen to be afebrile, pulse 1 15-1 39, respirations 21-42, blood pressures 104/66-135/69, and O2 saturations as low as 84% with improvement on 10 L of high flow nasal cannula oxygen.  Labs significant for CO2 12.2, hemoglobin 12.2, sodium 134, CO2 35, albumin 2.5, high-sensitivity troponin negative x2, and lactic acid within normal limits.  Chest x-ray showed chronic lung disease without definitive acute process, cardiomegaly, lymphadenopathy.  Urinalysis was positive for small leukocytes, rare bacteria, and 11-20 WBCs patient was given 1 L normal saline IV fluids, vancomycin and cefepime.   Subjective: -Denies any fever, chills, reports dyspnea at baseline, he has some blood-tinged trach secretions  overnight.  Assessment & Plan:   Principal Problem:   Sepsis due to pneumonia [facility]) Active Problems:   Hodgkin's lymphoma ([place])   Tracheostomy dependence ([place])   Acute on chronic respiratory failure with hypoxia ([name])   Pulmonary fibrosis ([name])  Acute on chronic hypoxic respiratory failure, tracheostomy dependent . -With underlining chronic, trach dependent in the setting of COVID-19, and possible pneumonitis from Brenttuximab complicated by his Hodgkin's lymphoma treated with current brent rituximab . -PCCM input greatly appreciated, no indication for antibiotics currently, continue with antibiotics. -Continue with steroids given  Wheezing. -Patient with blood-tinged trach secretions, will hold Lovenox this evening.  Acute on chronic respiratory failure with hypoxia S/p tracheostomy: At baseline patient is on 8 L of oxygen normally, but requiring continued here in the emergency department maintain O2 saturations. -Continuous pulse oximetry with oxygen to maintain O2 saturations greater than 92% -Tracheostomy care  -Albuterol .  4 times daily and as needed.  Dehydration:  - Acute.  Present on admission with hemoglobin much higher than his baseline(12.2 on admission, baseline 8-9) - improving with IV fluids.  S/p PEG -Consult dietitian to ensure appropriate nutrition requirements met  Hodgkin's lymphoma:  -Patient currently followed by Dr. Marin Olp of oncology on chemotherapy -Dr. Marin Olp messaged in regards to the patient's admission into the hospital.  Anemia of chronic disease:  -Hemoglobin 12.2 on admission, this is due to hemoconcentration from dehydration, this morning it is 7.8, oncology input greatly appreciated, will receive IV iron and aranesp. -Continue to monitor   DVT prophylaxis: Lovenox Code Status: Full Family Communication:  None at bedside Disposition:   Status  is: Inpatient  Remains inpatient appropriate because:IV treatments appropriate due to  intensity of illness or inability to take PO   Dispo: The patient is from: Home              Anticipated d/c is to: Home              Patient currently is not medically stable to d/c.   Difficult to place patient No       Consultants:   PCCM   Procedures:         Objective: Vitals:   08/15/20 0850 08/15/20 0941 08/15/20 1126 08/15/20 1150  BP:  111/63  121/77  Pulse:  (!) 104 95 (!) 105  Resp: (!) 22 (!) 27  (!) 27  Temp:  97.9 F (36.6 C)  98.2 F (36.8 C)  TempSrc:  Oral  Oral  SpO2: 96% 93% 94% 98%  Weight:      Height:        Intake/Output Summary (Last 24 hours) at 08/15/2020 1239 Last data filed at 08/15/2020 0539 Gross per 24 hour  Intake --  Output 725 ml  Net -725 ml   Filed Weights   08/13/20 1233 08/13/20 2024  Weight: 61.2 kg 61.6 kg    Examination:  Awake Alert, frail, deconditioned chronically ill-appearing male, laying in bed, no apparent distress  - trach + Symmetrical Chest wall movement, Good air movement bilaterally, scattered rhonchi RRR,No Gallops,Rubs or new Murmurs, No Parasternal Heave +ve B.Sounds, Abd Soft, No tenderness, PEG + No Cyanosis, Clubbing or edema, No new Rash or bruise      Data Reviewed: I have personally reviewed following labs and imaging studies  CBC: Recent Labs  Lab 08/13/20 1248 08/14/20 0107 08/15/20 0027  WBC 12.2* 12.0* 14.4*  NEUTROABS 9.2*  --   --   HGB 12.2* 8.2* 7.8*  HCT 39.9 26.5* 26.2*  MCV 88.5 88.6 91.9  PLT 150 176 967    Basic Metabolic Panel: Recent Labs  Lab 08/13/20 1248 08/14/20 0107 08/15/20 0027  NA 134* 137 141  K 4.3 4.1 3.8  CL 93* 98 100  CO2 35* 32 36*  GLUCOSE 123* 151* 212*  BUN 23 17 17   CREATININE 0.70 0.64 0.55*  CALCIUM 9.2 9.3 9.1    GFR: Estimated Creatinine Clearance: 81.1 mL/min (A) (by C-G formula based on SCr of 0.55 mg/dL (L)).  Liver Function Tests: Recent Labs  Lab 08/13/20 1248  AST 23  ALT 21  ALKPHOS 66  BILITOT 0.7  PROT 7.4   ALBUMIN 2.5*    CBG: Recent Labs  Lab 08/14/20 1226 08/14/20 2053 08/15/20 0003 08/15/20 0607 08/15/20 1154  GLUCAP 207* 218* 204* 201* 235*     Recent Results (from the past 240 hour(s))  Blood Culture (routine x 2)     Status: None (Preliminary result)   Collection Time: 08/13/20 12:20 PM   Specimen: BLOOD LEFT HAND  Result Value Ref Range Status   Specimen Description BLOOD LEFT HAND  Final   Special Requests   Final    BOTTLES DRAWN AEROBIC AND ANAEROBIC Blood Culture results may not be optimal due to an inadequate volume of blood received in culture bottles   Culture   Final    NO GROWTH 2 DAYS Performed at [facility], [place] [address] [place], [place] [address]    Report Status PENDING  Incomplete  Urine culture     Status: None (Preliminary result)   Collection Time: 08/13/20  12:20 PM   Specimen: In/Out Cath Urine  Result Value Ref Range Status   Specimen Description IN/OUT CATH URINE  Final   Special Requests NONE  Final   Culture   Final    CULTURE REINCUBATED FOR BETTER GROWTH Performed at [facility], [address] [place], [place] [address]    Report Status PENDING  Incomplete  Blood Culture (routine x 2)     Status: None (Preliminary result)   Collection Time: 08/13/20 12:25 PM   Specimen: BLOOD RIGHT FOREARM  Result Value Ref Range Status   Specimen Description BLOOD RIGHT FOREARM  Final   Special Requests   Final    BOTTLES DRAWN AEROBIC AND ANAEROBIC Blood Culture adequate volume   Culture  Setup Time   Final    GRAM POSITIVE COCCI AEROBIC BOTTLE ONLY CRITICAL RESULT CALLED TO, READ BACK BY AND VERIFIED WITH: Davy Pique [phone] FCP Performed at [facility], [place] [address], [place], [place] [address]    Culture GRAM POSITIVE COCCI  Final   Report Status PENDING  Incomplete  Blood Culture ID Panel (Reflexed)     Status: Abnormal   Collection Time: 08/13/20 12:25 PM  Result Value Ref Range Status   Enterococcus  faecalis NOT DETECTED NOT DETECTED Final   Enterococcus Faecium NOT DETECTED NOT DETECTED Final   Listeria monocytogenes NOT DETECTED NOT DETECTED Final   Staphylococcus species DETECTED (A) NOT DETECTED Final    Comment: CRITICAL RESULT CALLED TO, READ BACK BY AND VERIFIED WITH: PHARMD S KOEN [phone] FCP    Staphylococcus aureus (BCID) NOT DETECTED NOT DETECTED Final   Staphylococcus epidermidis NOT DETECTED NOT DETECTED Final   Staphylococcus lugdunensis NOT DETECTED NOT DETECTED Final   Streptococcus species NOT DETECTED NOT DETECTED Final   Streptococcus agalactiae NOT DETECTED NOT DETECTED Final   Streptococcus pneumoniae NOT DETECTED NOT DETECTED Final   Streptococcus pyogenes NOT DETECTED NOT DETECTED Final   A.calcoaceticus-baumannii NOT DETECTED NOT DETECTED Final   Bacteroides fragilis NOT DETECTED NOT DETECTED Final   Enterobacterales NOT DETECTED NOT DETECTED Final   Enterobacter cloacae complex NOT DETECTED NOT DETECTED Final   Escherichia coli NOT DETECTED NOT DETECTED Final   Klebsiella aerogenes NOT DETECTED NOT DETECTED Final   Klebsiella oxytoca NOT DETECTED NOT DETECTED Final   Klebsiella pneumoniae NOT DETECTED NOT DETECTED Final   Proteus species NOT DETECTED NOT DETECTED Final   Salmonella species NOT DETECTED NOT DETECTED Final   Serratia marcescens NOT DETECTED NOT DETECTED Final   Haemophilus influenzae NOT DETECTED NOT DETECTED Final   Neisseria meningitidis NOT DETECTED NOT DETECTED Final   Pseudomonas aeruginosa NOT DETECTED NOT DETECTED Final   Stenotrophomonas maltophilia NOT DETECTED NOT DETECTED Final   Candida albicans NOT DETECTED NOT DETECTED Final   Candida auris NOT DETECTED NOT DETECTED Final   Candida glabrata NOT DETECTED NOT DETECTED Final   Candida krusei NOT DETECTED NOT DETECTED Final   Candida parapsilosis NOT DETECTED NOT DETECTED Final   Candida tropicalis NOT DETECTED NOT DETECTED Final   Cryptococcus neoformans/gattii NOT  DETECTED NOT DETECTED Final    Comment: Performed at [facility], [address] [place], [place] [address]  Expectorated Sputum Assessment w Gram Stain, Rflx to Resp Cult     Status: None   Collection Time: 08/13/20 12:31 PM   Specimen: Expectorated Sputum  Result Value Ref Range Status   Specimen Description EXPECTORATED SPUTUM  Final   Special Requests NONE  Final   Sputum evaluation   Final    THIS SPECIMEN IS ACCEPTABLE FOR SPUTUM CULTURE Performed at [facility], [place] [address] [place], [place] [address]    Report Status 08/13/2020 FINAL  Final  Culture, Respiratory w Gram Stain     Status: None (Preliminary result)   Collection Time: 08/13/20 12:31 PM  Result Value Ref Range Status   Specimen Description EXPECTORATED SPUTUM  Final   Special Requests NONE Reflexed from [id number]  Final   Gram Stain   Final    ABUNDANT WBC PRESENT, PREDOMINANTLY PMN MODERATE GRAM POSITIVE RODS RARE GRAM POSITIVE COCCI    Culture   Final    Normal respiratory flora-no Staph aureus or Pseudomonas seen Performed at [facility], [place] [address], [place], [place] [address]    Report Status PENDING  Incomplete  Resp Panel by RT-PCR (Flu A&B, Covid) Nasopharyngeal Swab     Status: None   Collection Time: 08/13/20 12:46 PM   Specimen: Nasopharyngeal Swab; Nasopharyngeal(NP) swabs in vial transport medium  Result Value Ref Range Status   SARS Coronavirus 2 by RT PCR NEGATIVE NEGATIVE Final    Comment: (NOTE) SARS-CoV-2 target nucleic acids are NOT DETECTED.  The SARS-CoV-2 RNA is generally detectable in upper respiratory specimens during the acute phase of infection. The lowest concentration of SARS-CoV-2 viral copies this assay can detect is 138 copies/mL. A negative result does not preclude SARS-Cov-2 infection and should not be used as the sole basis for treatment or other patient management decisions. A negative result may occur with  improper specimen collection/handling,  submission of specimen other than nasopharyngeal swab, presence of viral mutation(s) within the areas targeted by this assay, and inadequate number of viral copies(<138 copies/mL). A negative result must be combined with clinical observations, patient history, and epidemiological information. The expected result is Negative.  Fact Sheet for Patients:  EntrepreneurPulse.com.au  Fact Sheet for Healthcare Providers:  IncredibleEmployment.be  This test is no t yet approved or cleared by the [place] FDA and  has been authorized for detection and/or diagnosis of SARS-CoV-2 by FDA under an Emergency Use Authorization (EUA). This EUA will remain  in effect (meaning this test can be used) for the duration of the COVID-19 declaration under Section 564(b)(1) of the Act, 21 U.S.C.section 360bbb-3(b)(1), unless the authorization is terminated  or revoked sooner.       Influenza A by PCR NEGATIVE NEGATIVE Final   Influenza B by PCR NEGATIVE NEGATIVE Final    Comment: (NOTE) The Xpert Xpress SARS-CoV-2/FLU/RSV plus assay is intended as an aid in the diagnosis of influenza from Nasopharyngeal swab specimens and should not be used as a sole basis for treatment. Nasal washings and aspirates are unacceptable for Xpert Xpress SARS-CoV-2/FLU/RSV testing.  Fact Sheet for Patients: EntrepreneurPulse.com.au  Fact Sheet for Healthcare Providers: IncredibleEmployment.be  This test is not yet approved or cleared by the [place] FDA and has been authorized for detection and/or diagnosis of SARS-CoV-2 by FDA under an Emergency Use Authorization (EUA). This EUA will remain in effect (meaning this test can be used) for the duration of the COVID-19 declaration under Section 564(b)(1) of the Act, 21 U.S.C. section 360bbb-3(b)(1), unless the authorization is terminated or revoked.  Performed at [facility], [place] [address], [place], [place] [address]   MRSA PCR Screening     Status: None   Collection Time: 08/13/20  6:17 PM   Specimen: Nasal Mucosa; Nasopharyngeal  Result Value  Ref Range Status   MRSA by PCR NEGATIVE NEGATIVE Final    Comment:        The GeneXpert MRSA Assay (FDA approved for NASAL specimens only), is one component of a comprehensive MRSA colonization surveillance program. It is not intended to diagnose MRSA infection nor to guide or monitor treatment for MRSA infections. Performed at [facility], [place] [address] [place], [place] [address]          Radiology Studies: No results found.      Scheduled Meds: . Chlorhexidine Gluconate Cloth  6 each Topical Daily  . enoxaparin (LOVENOX) injection  40 mg Subcutaneous Q24H  . feeding supplement (PROSource TF)  45 mL Per Tube Daily  . free water  180 mL Per Tube Q6H  . ipratropium-albuterol  3 mL Nebulization TID  . magnesium oxide  400 mg Oral BID  . methylPREDNISolone (SOLU-MEDROL) injection  40 mg Intravenous Q8H  . multivitamin  15 mL Per Tube Daily  . pantoprazole  40 mg Oral Daily  . sertraline  50 mg Oral Daily  . sodium chloride flush  10-40 mL Intracatheter Q12H  . traZODone  100 mg Per Tube QHS   Continuous Infusions: . azithromycin 500 mg (08/14/20 1738)  . cefTRIAXone (ROCEPHIN)  IV 2 g (08/14/20 2223)  . feeding supplement (OSMOLITE 1.2 CAL) 1,000 mL (08/15/20 6553)     LOS: 2 days       Phillips Climes, MD [facility]   To contact the attending provider between 7A-7P or the covering provider during after hours 7P-7A, please log into the web site [email or web address] and access using universal [place] password for that web site. If you do not have the password, please call the hospital operator.  08/15/2020, 12:39 PM

## 2020-08-15 NOTE — Progress Notes (Signed)
PT Cancellation Note  Patient Details Name: Steve [name] MRN: [id number] DOB: [birth year]   Cancelled Treatment:    Reason Eval/Treat Not Completed: Other (comment).  Pt is feeling too compromised to move, asked to wait.  Retry at another time.   Ramond Dial 08/15/2020, 11:42 AM   Mee Hives, PT MS Acute Rehab Dept. Number: [place] and [place]

## 2020-08-15 NOTE — Progress Notes (Signed)
   08/15/20 0941  Assess: MEWS Score  Temp 97.9 F (36.6 C)  BP 111/63  Pulse Rate (!) 104  ECG Heart Rate (!) 104  Resp (!) 27  SpO2 93 %  Assess: MEWS Score  MEWS Temp 0  MEWS Systolic 0  MEWS Pulse 1  MEWS RR 2  MEWS LOC 0  MEWS Score 3  MEWS Score Color Yellow  Assess: if the MEWS score is Yellow or Red  Were vital signs taken at a resting state? Yes  Focused Assessment No change from prior assessment  Early Detection of Sepsis Score *See Row Information* Medium  MEWS guidelines implemented *See Row Information* Yes  Treat  Pain Scale 0-10  Pain Score 0  Take Vital Signs  Increase Vital Sign Frequency  Yellow: Q 2hr X 2 then Q 4hr X 2, if remains yellow, continue Q 4hrs  Escalate  MEWS: Escalate Yellow: discuss with charge nurse/RN and consider discussing with provider and RRT  Notify: Charge Nurse/RN  Name of Charge Nurse/RN Notified erin  Date Charge Nurse/RN Notified 08/15/20  Time Charge Nurse/RN Notified 0940

## 2020-08-15 NOTE — Progress Notes (Signed)
Went to see patient to give scheduled breathing treatment. Before treatment was started patient stated his chest felt congested and tight and wanted to be suctioned. SPO2 was 85% at this time on 60% 10L ATC. Suctioned patient and received small bloody secretions. Patient dropped to 80% and ATC was increased to 10L 98%. Patient stated "it felt a little better but not much" and wanted to be sat up in bed. Once positioned higher in bed, breathing treatment was started. RT made RN aware, RN notified MD. By the end of the breathing treatment, patient still on 98%, SPO2 came to 96%. Patient states he feels better. Per MD to RN to give lasix. RT will continue to monitor patient

## 2020-08-16 DIAGNOSIS — J189 Pneumonia, unspecified organism: Secondary | ICD-10-CM | POA: Diagnosis not present

## 2020-08-16 DIAGNOSIS — J9621 Acute and chronic respiratory failure with hypoxia: Secondary | ICD-10-CM | POA: Diagnosis not present

## 2020-08-16 DIAGNOSIS — A419 Sepsis, unspecified organism: Secondary | ICD-10-CM | POA: Diagnosis not present

## 2020-08-16 DIAGNOSIS — D638 Anemia in other chronic diseases classified elsewhere: Secondary | ICD-10-CM | POA: Diagnosis not present

## 2020-08-16 LAB — BASIC METABOLIC PANEL
Anion gap: 3 — ABNORMAL LOW (ref 5–15)
BUN: 18 mg/dL (ref 8–23)
CO2: 42 mmol/L — ABNORMAL HIGH (ref 22–32)
Calcium: 8.6 mg/dL — ABNORMAL LOW (ref 8.9–10.3)
Chloride: 97 mmol/L — ABNORMAL LOW (ref 98–111)
Creatinine, Ser: 0.55 mg/dL — ABNORMAL LOW (ref 0.61–1.24)
GFR, Estimated: >60 mL/min (ref >60)
Glucose, Bld: 252 mg/dL — ABNORMAL HIGH (ref 70–99)
Potassium: 4.2 mmol/L (ref 3.5–5.1)
Sodium: 142 mmol/L (ref 135–145)

## 2020-08-16 LAB — CULTURE, RESPIRATORY W GRAM STAIN: Culture: NORMAL

## 2020-08-16 LAB — POCT I-STAT 7, (LYTES, BLD GAS, ICA,H+H)
Acid-Base Excess: 18 mmol/L — ABNORMAL HIGH (ref 0.0–2.0)
Bicarbonate: 45.7 mmol/L — ABNORMAL HIGH (ref 20.0–28.0)
Calcium, Ion: 1.29 mmol/L (ref 1.15–1.40)
HCT: 22 % — ABNORMAL LOW (ref 39.0–52.0)
Hemoglobin: 7.5 g/dL — ABNORMAL LOW (ref 13.0–17.0)
O2 Saturation: 100 %
Potassium: 4.5 mmol/L (ref 3.5–5.1)
Sodium: 142 mmol/L (ref 135–145)
TCO2: 48 mmol/L — ABNORMAL HIGH (ref 22–32)
pCO2 arterial: 81.4 mmHg (ref 32.0–48.0)
pH, Arterial: 7.357 (ref 7.350–7.450)
pO2, Arterial: 264 mmHg — ABNORMAL HIGH (ref 83.0–108.0)

## 2020-08-16 LAB — MRSA PCR SCREENING: MRSA by PCR: NEGATIVE

## 2020-08-16 LAB — CBC
HCT: 25.9 % — ABNORMAL LOW (ref 39.0–52.0)
Hemoglobin: 7.6 g/dL — ABNORMAL LOW (ref 13.0–17.0)
MCH: 27.5 pg (ref 26.0–34.0)
MCHC: 29.3 g/dL — ABNORMAL LOW (ref 30.0–36.0)
MCV: 93.8 fL (ref 80.0–100.0)
Platelets: 183 10*3/uL (ref 150–400)
RBC: 2.76 MIL/uL — ABNORMAL LOW (ref 4.22–5.81)
RDW: 18.6 % — ABNORMAL HIGH (ref 11.5–15.5)
WBC: 16.9 10*3/uL — ABNORMAL HIGH (ref 4.0–10.5)
nRBC: 0 % (ref 0.0–0.2)

## 2020-08-16 LAB — BLOOD GAS, ARTERIAL
Acid-Base Excess: 14 mmol/L — ABNORMAL HIGH (ref 0.0–2.0)
Bicarbonate: 42.4 mmol/L — ABNORMAL HIGH (ref 20.0–28.0)
Drawn by: 54887
FIO2: 100
O2 Saturation: 99.6 %
Patient temperature: 37
pCO2 arterial: 116 mmHg (ref 32.0–48.0)
pH, Arterial: 7.187 — CL (ref 7.350–7.450)
pO2, Arterial: 297 mmHg — ABNORMAL HIGH (ref 83.0–108.0)

## 2020-08-16 LAB — GLUCOSE, CAPILLARY
Glucose-Capillary: 239 mg/dL — ABNORMAL HIGH (ref 70–99)
Glucose-Capillary: 242 mg/dL — ABNORMAL HIGH (ref 70–99)
Glucose-Capillary: 244 mg/dL — ABNORMAL HIGH (ref 70–99)
Glucose-Capillary: 249 mg/dL — ABNORMAL HIGH (ref 70–99)
Glucose-Capillary: 251 mg/dL — ABNORMAL HIGH (ref 70–99)
Glucose-Capillary: 269 mg/dL — ABNORMAL HIGH (ref 70–99)

## 2020-08-16 MED ORDER — MAGNESIUM OXIDE -MG SUPPLEMENT 400 (240 MG) MG PO TABS
400.0000 mg | ORAL_TABLET | Freq: Two times a day (BID) | ORAL | Status: DC
  Administered 2020-08-16 – 2020-08-25 (×18): 400 mg
  Filled 2020-08-16 (×19): qty 1

## 2020-08-16 MED ORDER — VANCOMYCIN HCL 1500 MG/300ML IV SOLN
1500.0000 mg | INTRAVENOUS | Status: DC
  Administered 2020-08-16: 1500 mg via INTRAVENOUS
  Filled 2020-08-16 (×3): qty 300

## 2020-08-16 MED ORDER — CHLORHEXIDINE GLUCONATE 0.12% ORAL RINSE (MEDLINE KIT)
15.0000 mL | Freq: Two times a day (BID) | OROMUCOSAL | Status: DC
  Administered 2020-08-16 – 2020-08-25 (×15): 15 mL via OROMUCOSAL

## 2020-08-16 MED ORDER — SERTRALINE HCL 50 MG PO TABS
50.0000 mg | ORAL_TABLET | Freq: Every day | ORAL | Status: DC
  Administered 2020-08-17 – 2020-08-25 (×9): 50 mg
  Filled 2020-08-16 (×9): qty 1

## 2020-08-16 MED ORDER — INSULIN ASPART 100 UNIT/ML IJ SOLN
0.0000 [IU] | INTRAMUSCULAR | Status: DC
  Administered 2020-08-16 (×3): 5 [IU] via SUBCUTANEOUS
  Administered 2020-08-17 (×2): 8 [IU] via SUBCUTANEOUS
  Administered 2020-08-17: 5 [IU] via SUBCUTANEOUS
  Administered 2020-08-17: 11 [IU] via SUBCUTANEOUS
  Administered 2020-08-17 (×2): 8 [IU] via SUBCUTANEOUS
  Administered 2020-08-18: 3 [IU] via SUBCUTANEOUS
  Administered 2020-08-18: 5 [IU] via SUBCUTANEOUS
  Administered 2020-08-18: 2 [IU] via SUBCUTANEOUS
  Administered 2020-08-18: 5 [IU] via SUBCUTANEOUS
  Administered 2020-08-18: 2 [IU] via SUBCUTANEOUS
  Administered 2020-08-19 – 2020-08-22 (×13): 3 [IU] via SUBCUTANEOUS
  Administered 2020-08-23: 2 [IU] via SUBCUTANEOUS
  Administered 2020-08-23 (×2): 3 [IU] via SUBCUTANEOUS
  Administered 2020-08-23: 2 [IU] via SUBCUTANEOUS
  Administered 2020-08-24 (×4): 3 [IU] via SUBCUTANEOUS
  Administered 2020-08-24: 5 [IU] via SUBCUTANEOUS
  Administered 2020-08-24 – 2020-08-25 (×2): 2 [IU] via SUBCUTANEOUS

## 2020-08-16 MED ORDER — ORAL CARE MOUTH RINSE
15.0000 mL | OROMUCOSAL | Status: DC
  Administered 2020-08-16 – 2020-08-25 (×68): 15 mL via OROMUCOSAL

## 2020-08-16 MED ORDER — WHITE PETROLATUM EX OINT
TOPICAL_OINTMENT | CUTANEOUS | Status: AC
  Administered 2020-08-16: 0.2
  Filled 2020-08-16: qty 28.35

## 2020-08-16 MED ORDER — SODIUM CHLORIDE 0.9 % IV SOLN
2.0000 g | Freq: Three times a day (TID) | INTRAVENOUS | Status: AC
  Administered 2020-08-16 – 2020-08-22 (×20): 2 g via INTRAVENOUS
  Filled 2020-08-16 (×20): qty 2

## 2020-08-16 MED ORDER — PANTOPRAZOLE SODIUM 40 MG PO PACK
40.0000 mg | PACK | Freq: Every day | ORAL | Status: DC
  Administered 2020-08-17 – 2020-08-25 (×9): 40 mg
  Filled 2020-08-16 (×9): qty 20

## 2020-08-16 MED ORDER — SODIUM CHLORIDE 0.9 % IV SOLN
INTRAVENOUS | Status: DC | PRN
  Administered 2020-08-16 – 2020-08-22 (×2): 250 mL via INTRAVENOUS

## 2020-08-16 NOTE — Progress Notes (Signed)
NAME:  Steve [name], MRN:  [id number], DOB:  19-Feb-1956, LOS: 3 ADMISSION DATE:  08/13/2020, CONSULTATION DATE: 08/14/2020 REFERRING MD: [facility], CHIEF COMPLAINT: Increasing shortness of breath  History of Present Illness:  65 year old male well-known to the pulmonary practice with chronic trach in the setting of COVID-19 in 2021 developed  acute respiratory distress syndrome, complicated by pulmonary fibrosis requiring chronic tracheostomy.  This is further complicated by his Hodgkin's lymphoma requiring chemotherapy.  He was in his usual state of health using 8 L of oxygen via his tracheostomy until 3 to 4 days ago he noticed increasing sputum production, difficulty clearing his airway, and increasing FiO2 needs with saturations declining.  He presented to [facility] his FiO2 was increased to 60% via high flow trach collar he is now saturating 97%.  He does have bilateral wheezes and coarse rhonchi.  He has a fenestrated trach in place.  Currently on antimicrobial therapy, high flow FiO2 and appears to be responding to therapy.  Pertinent  Medical History    has a past medical history of Abscess of muscle (08/10/2011), Acute on chronic respiratory failure with hypoxia ([place]), Acute respiratory distress syndrome (ARDS) due to COVID-19 virus ([place]), Anxiety, Diabetes mellitus without complication ([place]), GERD (gastroesophageal reflux disease), HCAP (healthcare-associated pneumonia), Hip dysplasia, congenital, Hodgkin lymphoma ([place]), Hypertension, Pancreatitis, Pneumothorax, acute, Postoperative anemia due to acute blood loss (09/07/2012), Septic arthritis of hip ([place]) (09/05/2012), and Sleep apnea.  Significant Hospital Events: Including procedures, antibiotic start and stop dates in addition to other pertinent events   . 5/5 Admit with pneumonia.  Treated with ceftriaxone, azithromycin . 5/8 Increasing O2 requirements, worsening chest x-ray .   Interim History / Subjective:  Called back to see  patient over the weekend as he has increasing O2 requirements, secretions Chest x-ray looks worse  Objective   Blood pressure 127/76, pulse (!) 112, temperature 98.9 F (37.2 C), temperature source Oral, resp. rate (!) 32, height 5\' 5"  (1.651 m), weight 65.1 kg, SpO2 90 %.    FiO2 (%):  [60 %-100 %] 98 %   Intake/Output Summary (Last 24 hours) at 08/16/2020 1042 Last data filed at 08/16/2020 1028 Gross per 24 hour  Intake 1200 ml  Output 1575 ml  Net -375 ml   Filed Weights   08/13/20 1233 08/13/20 2024 08/16/20 0549  Weight: 61.2 kg 61.6 kg 65.1 kg    Examination: Gen:      No acute distress, chronically ill HEENT:  EOMI, sclera anicteric Neck:     No masses; no thyromegaly, tracheostomy Lungs:    Biateral crackles CV:         Regular rate and rhythm; no murmurs Abd:      + bowel sounds; soft, non-tender; no palpable masses, no distension Ext:    No edema; adequate peripheral perfusion Skin:      Warm and dry; no rash Neuro: Awake, oriented  Labs/imaging that I havepersonally reviewed  (right click and "Reselect all SmartList Selections" daily)   Chest x-ray 08/15/2020-increasing airspace opacity in the left upper lobe  Resolved Hospital Problem list     Assessment & Plan:  Acute on chronic hypoxic respiratory failure in setting of tracheostomy requirement from COVID-19 in 2021 with frequent admissions for hypoxia and increased tracheal secretions.  Positive for ARDS with questionable pneumonitis from Brenttuximab complicated by his Hodgkin's lymphoma treated with current brent rituximab presents [facility] 09/2020 increasing respiratory distress.  Has worsening respiratory distress over 24 hours with O2 requirements  X-ray shows increasing infiltrates Will broaden antibiotics from ceftriaxone and azithromycin to Vanco and cefepime Transferred to ICU Changed to cuffed trach placed on a vent.  He will need short-term ventilation  Hodgkin's lymphoma Per  oncology  Generalized failure to thrive in the setting of ARDS, COVID-19, trach dependent and malnutrition. Continue to monitor  Gastroesophageal reflux disease PPI May need repeat swallowing evaluation  Best practice (right click and "Reselect all SmartList Selections" daily)  Diet:  Tube Feed  Pain/Anxiety/Delirium protocol (if indicated): Yes (RASS goal 0) VAP protocol (if indicated): Yes DVT prophylaxis: LMWH GI prophylaxis: PPI Glucose control:  SSI No Central venous access:  N/A Arterial line:  N/A Foley:  N/A Mobility:  bed rest  PT consulted: N/A Last date of multidisciplinary goals of care discussion [ ]  Code Status:  full code Disposition: ICU  Critical care time:    The patient is critically ill with multiple organ system failure and requires high complexity decision making for assessment and support, frequent evaluation and titration of therapies, advanced monitoring, review of radiographic studies and interpretation of complex data.   Critical Care Time devoted to patient care services, exclusive of separately billable procedures, described in this note is 45 minutes.   Marshell Garfinkel MD [facility] See Amion for pager  If no response to pager , please call [phone] until 7pm After 7:00 pm call Elink  [phone] 08/16/2020, 11:15 AM

## 2020-08-16 NOTE — Progress Notes (Signed)
Pt is awake in bed; AOX4. Vitals stable. Sats stable on 10L 98% trach collar. Dyspnea at rest, labored breathing at times. Thick bloody sputum noted when trach suctioned. Shift assessment complete. IVF infusing without complications. PEG tube placement verified with auscultation, no residual noted. Flushed with 180cc of sterile water. Repositioned with pillow. Immediate needs addressed. Bed in lowest position with call light within reach and 3 side rails up. Will continue to monitor. Bed alarm on. Door open. Will continue to assess.

## 2020-08-16 NOTE — Procedures (Addendum)
Tracheostomy Change Note  Patient Details:   Name: Steve [name] DOB: June 10, 1955 MRN: [id number]    Airway Documentation:     Evaluation  O2 sats: stable throughout Complications: No apparent complications Patient did tolerate procedure well. Bilateral Breath Sounds: Rhonchi,Diminished   CCM MD and RT changed pt's trach to a #6 shiley flex cuffed to place pt onto the ventilator. Color change was noted post insertion and there were no complications. There is a significant gap under the stoma site causing a small leak. MD and RN aware. Double mepilex was placed under the stoma at the attempt to cover it. Pt is stable at this time. RT will monitor.     Ronaldo Miyamoto 08/16/2020, 11:50 AM

## 2020-08-16 NOTE — Progress Notes (Addendum)
Pharmacy Antibiotic Note  Steve [name] is a 65 y.o. male admitted on 08/13/2020.  Pharmacy has been consulted for vancomycin and cefepime dosing for PNA. Scr stable.   Plan: Start cefepime 2g IV q8h  Start vancomycin 1500mg  IV q24h (eAUC 446 using Vd 0.72 and Scr 0.8) Monitor renal function, cultures, and clinical progression Follow up MRSA PCR   Height: 5\' 5"  (165.1 cm) Weight: 65.1 kg (143 lb 8.3 oz) IBW/kg (Calculated) : 61.5  Temp (24hrs), Avg:98.3 F (36.8 C), Min:97.9 F (36.6 C), Max:98.9 F (37.2 C)  Recent Labs  Lab 08/13/20 1220 08/13/20 1248 08/13/20 1547 08/14/20 0107 08/15/20 0027 08/16/20 0406  WBC  --  12.2*  --  12.0* 14.4* 16.9*  CREATININE  --  0.70  --  0.64 0.55* 0.55*  LATICACIDVEN 1.1  --  0.9  --   --   --     Estimated Creatinine Clearance: 81.1 mL/min (A) (by C-G formula based on SCr of 0.55 mg/dL (L)).    No Known Allergies  Antimicrobials this admission: Vancomycin 5/5 >> 5/6; 5/8 >> Azithro 5/6 >> 5/7 Ceftriaxone 5/5 >> 5/7  Cefepime x1 5/5; 5/8 >>   Dose adjustments this admission: N/a  Microbiology results: 5/5 bcx: 1/2  Staph warneri 5/5 50k diptheroids  5/5 resp: normal flora  Thank you for allowing pharmacy to be a part of this patient's care.  Cristela Felt, PharmD Clinical Pharmacist  08/16/2020 10:48 AM

## 2020-08-16 NOTE — Progress Notes (Signed)
PT Cancellation Note  Patient Details Name: Steve [name] MRN: [id number] DOB: 11-20-1955   Cancelled Treatment:    Reason Eval/Treat Not Completed: Medical issues which prohibited therapy. Pt transferred to ICU due to increased oxygen needs. PT will follow up when pt is more medically stable.   Zenaida Niece 08/16/2020, 11:50 AM

## 2020-08-16 NOTE — Progress Notes (Addendum)
PROGRESS NOTE    Steve [name]  BMW:[id number] DOB: [birth year] DOA: 08/13/2020 PCP: [name], PA-C     Chief Complaint  Patient presents with  . Shortness of Breath    Brief Narrative:    Steve [name] is a 65 y.o. male with medical history significant of chronic tracheostomy after prolonged hospitalization in 2021 related with pneumonia due to [id number] complicated by ARDS and pulmonary fibrosis +/-pneumonitis from brentuximab, s/p PEG, HTN, DM type II, Hodgkin's lymphoma on chemotherapy, and GERD presents with complaints of shortness of breath over the last 3 to 4 days.  At baseline patient reports that he is on 8 L of oxygen at home, but had been noticing that his oxygenation would drop into the 70s.  Patient reports that his trach cannula keeps getting stopped up with phlegm.  Associated symptoms include fever up to 100.4 F at home, malaise, and wheezing.  He had been eating regular foods unlawful in addition to his tube feeds, but he stopped eating food by mouth 3 days ago as he felt that it was worsening symptoms. -Patient was admitted for worsening respiratory failure, kept on broad-spectrum antibiotics and steroids, he is with increased work of breathing, chest x-ray worsening, he required transfer to ICU 5/8 where he cuffless trach was changed to cuffed and he is back on the ventilator 08/16/2020.   Subjective: -Reports no nausea, no vomiting, reports generalized weakness and fatigue, reports worsening dyspnea .  Patient required to go back on the vent this afternoon.  Assessment & Plan:   Principal Problem:   Sepsis due to pneumonia [facility]) Active Problems:   Hodgkin's lymphoma ([place])   Tracheostomy dependence ([place])   Acute on chronic respiratory failure with hypoxia ([name])   Pulmonary fibrosis ([name])  Acute on chronic hypoxic respiratory failure, tracheostomy dependent . -With underlining chronic, trach dependent in the setting of COVID-19, and possible pneumonitis  from Brenttuximab complicated by his Hodgkin's lymphoma treated with current brenttuximab . -At baseline patient is on 8 L of oxygen normall -He is treated with antibiotics for pneumonia, and steroids for wheezing. -Tachypneic, tachycardic with increased work of breathing, appears to be tiring, back on the vent 5/8 -Tracheostomy care  -Albuterol .  Dehydration:  - Acute.  Present on admission with hemoglobin much higher than his baseline(12.2 on admission, baseline 8-9) - improving with IV fluids.  1/4 bottles positive for Staphylococcus warenri  -Most likely contaminant  S/p PEG -Continue with tube feeds  Hodgkin's lymphoma:  -Patient currently followed by Dr. Marin Olp of oncology on chemotherapy -Dr. Marin Olp messaged in regards to the patient's admission into the hospital.  Anemia of chronic disease:  -Hemoglobin 12.2 on admission, this is due to hemoconcentration from dehydration, back to baseline 7/8 after appropriate IV hydration . -Collagen input appreciated, status post IV iron and aranesp. -Continue to monitor     DVT prophylaxis: Lovenox Code Status: Full Family Communication:  None at bedside, wife updated by phone, she was updated about transfer to ICU due to him going back on the vent. Disposition:   Status is: Inpatient  Remains inpatient appropriate because:IV treatments appropriate due to intensity of illness or inability to take PO   Dispo: The patient is from: Home              Anticipated d/c is to: Home              Patient currently is not medically stable to d/c.   Difficult to place patient No  Consultants:   PCCM   Procedures:         Objective: Vitals:   08/16/20 0750 08/16/20 0842 08/16/20 1135 08/16/20 1141  BP: 127/72 127/76    Pulse: (!) 104 (!) 112 (!) 103   Resp: (!) 33 (!) 32 16   Temp: 98.9 F (37.2 C)   (!) 97.5 F (36.4 C)  TempSrc: Oral   Axillary  SpO2: 90% 90%    Weight:      Height:         Intake/Output Summary (Last 24 hours) at 08/16/2020 1304 Last data filed at 08/16/2020 1028 Gross per 24 hour  Intake 1200 ml  Output 1575 ml  Net -375 ml   Filed Weights   08/13/20 1233 08/13/20 2024 08/16/20 0549  Weight: 61.2 kg 61.6 kg 65.1 kg    Examination:  Awake, frail, severely deconditioned, chronically ill-appearing, in mild respiratory distress, tachypneic and tachycardic  Trach plus  Scattered air rhonchi at the bases, some use of accessory muscles  Tachycardic, no rubs or gallops  +ve B.Sounds, Abd Soft, No tenderness, PEG + No Cyanosis, Clubbing or edema, No new Rash or bruise      Data Reviewed: I have personally reviewed following labs and imaging studies  CBC: Recent Labs  Lab 08/13/20 1248 08/14/20 0107 08/15/20 0027 08/16/20 0406  WBC 12.2* 12.0* 14.4* 16.9*  NEUTROABS 9.2*  --   --   --   HGB 12.2* 8.2* 7.8* 7.6*  HCT 39.9 26.5* 26.2* 25.9*  MCV 88.5 88.6 91.9 93.8  PLT 150 176 163 683    Basic Metabolic Panel: Recent Labs  Lab 08/13/20 1248 08/14/20 0107 08/15/20 0027 08/16/20 0406  NA 134* 137 141 142  K 4.3 4.1 3.8 4.2  CL 93* 98 100 97*  CO2 35* 32 36* 42*  GLUCOSE 123* 151* 212* 252*  BUN 23 17 17 18   CREATININE 0.70 0.64 0.55* 0.55*  CALCIUM 9.2 9.3 9.1 8.6*    GFR: Estimated Creatinine Clearance: 81.1 mL/min (A) (by C-G formula based on SCr of 0.55 mg/dL (L)).  Liver Function Tests: Recent Labs  Lab 08/13/20 1248  AST 23  ALT 21  ALKPHOS 66  BILITOT 0.7  PROT 7.4  ALBUMIN 2.5*    CBG: Recent Labs  Lab 08/15/20 2112 08/15/20 2324 08/16/20 0413 08/16/20 0727 08/16/20 1145  GLUCAP 249* 242* 251* 239* 244*     Recent Results (from the past 240 hour(s))  Blood Culture (routine x 2)     Status: None (Preliminary result)   Collection Time: 08/13/20 12:20 PM   Specimen: BLOOD LEFT HAND  Result Value Ref Range Status   Specimen Description BLOOD LEFT HAND  Final   Special Requests   Final    BOTTLES DRAWN  AEROBIC AND ANAEROBIC Blood Culture results may not be optimal due to an inadequate volume of blood received in culture bottles   Culture   Final    NO GROWTH 3 DAYS Performed at [facility], [place] [address], [place], [place] [address]    Report Status PENDING  Incomplete  Urine culture     Status: Abnormal   Collection Time: 08/13/20 12:20 PM   Specimen: In/Out Cath Urine  Result Value Ref Range Status   Specimen Description IN/OUT CATH URINE  Final   Special Requests NONE  Final   Culture (A)  Final    5,000 COLONIES/mL DIPHTHEROIDS(CORYNEBACTERIUM SPECIES) Standardized susceptibility testing for this organism is not available. Performed at [facility]  [facility], [place] [address] [place], [place] [address]    Report Status 08/15/2020 FINAL  Final  Blood Culture (routine x 2)     Status: Abnormal   Collection Time: 08/13/20 12:25 PM   Specimen: BLOOD RIGHT FOREARM  Result Value Ref Range Status   Specimen Description BLOOD RIGHT FOREARM  Final   Special Requests   Final    BOTTLES DRAWN AEROBIC AND ANAEROBIC Blood Culture adequate volume   Culture  Setup Time   Final    GRAM POSITIVE COCCI AEROBIC BOTTLE ONLY CRITICAL RESULT CALLED TO, READ BACK BY AND VERIFIED WITH: PHARMD S KOEN [phone] [id number] FCP    Culture (A)  Final    STAPHYLOCOCCUS WARNERI THE SIGNIFICANCE OF ISOLATING THIS ORGANISM FROM A SINGLE SET OF BLOOD CULTURES WHEN MULTIPLE SETS ARE DRAWN IS UNCERTAIN. PLEASE NOTIFY THE MICROBIOLOGY DEPARTMENT WITHIN ONE WEEK IF SPECIATION AND SENSITIVITIES ARE REQUIRED. Performed at [facility], [place] [address] [place], [place] [address]    Report Status 08/15/2020 FINAL  Final  Blood Culture ID Panel (Reflexed)     Status: Abnormal   Collection Time: 08/13/20 12:25 PM  Result Value Ref Range Status   Enterococcus faecalis NOT DETECTED NOT DETECTED Final   Enterococcus Faecium NOT DETECTED NOT DETECTED Final   Listeria monocytogenes NOT DETECTED NOT DETECTED Final    Staphylococcus species DETECTED (A) NOT DETECTED Final    Comment: CRITICAL RESULT CALLED TO, READ BACK BY AND VERIFIED WITH: PHARMD S KOEN [phone] [id number] FCP    Staphylococcus aureus (BCID) NOT DETECTED NOT DETECTED Final   Staphylococcus epidermidis NOT DETECTED NOT DETECTED Final   Staphylococcus lugdunensis NOT DETECTED NOT DETECTED Final   Streptococcus species NOT DETECTED NOT DETECTED Final   Streptococcus agalactiae NOT DETECTED NOT DETECTED Final   Streptococcus pneumoniae NOT DETECTED NOT DETECTED Final   Streptococcus pyogenes NOT DETECTED NOT DETECTED Final   A.calcoaceticus-baumannii NOT DETECTED NOT DETECTED Final   Bacteroides fragilis NOT DETECTED NOT DETECTED Final   Enterobacterales NOT DETECTED NOT DETECTED Final   Enterobacter cloacae complex NOT DETECTED NOT DETECTED Final   Escherichia coli NOT DETECTED NOT DETECTED Final   Klebsiella aerogenes NOT DETECTED NOT DETECTED Final   Klebsiella oxytoca NOT DETECTED NOT DETECTED Final   Klebsiella pneumoniae NOT DETECTED NOT DETECTED Final   Proteus species NOT DETECTED NOT DETECTED Final   Salmonella species NOT DETECTED NOT DETECTED Final   Serratia marcescens NOT DETECTED NOT DETECTED Final   Haemophilus influenzae NOT DETECTED NOT DETECTED Final   Neisseria meningitidis NOT DETECTED NOT DETECTED Final   Pseudomonas aeruginosa NOT DETECTED NOT DETECTED Final   Stenotrophomonas maltophilia NOT DETECTED NOT DETECTED Final   Candida albicans NOT DETECTED NOT DETECTED Final   Candida auris NOT DETECTED NOT DETECTED Final   Candida glabrata NOT DETECTED NOT DETECTED Final   Candida krusei NOT DETECTED NOT DETECTED Final   Candida parapsilosis NOT DETECTED NOT DETECTED Final   Candida tropicalis NOT DETECTED NOT DETECTED Final   Cryptococcus neoformans/gattii NOT DETECTED NOT DETECTED Final    Comment: Performed at [facility], [address], [place], [place] [address]  Expectorated Sputum Assessment w Gram Stain,  Rflx to Resp Cult     Status: None   Collection Time: 08/13/20 12:31 PM   Specimen: Expectorated Sputum  Result Value Ref Range Status   Specimen Description EXPECTORATED SPUTUM  Final   Special Requests NONE  Final   Sputum evaluation   Final  THIS SPECIMEN IS ACCEPTABLE FOR SPUTUM CULTURE Performed at [facility], [place] [address], [place], [place] [address]    Report Status 08/13/2020 FINAL  Final  Culture, Respiratory w Gram Stain     Status: None   Collection Time: 08/13/20 12:31 PM  Result Value Ref Range Status   Specimen Description EXPECTORATED SPUTUM  Final   Special Requests NONE Reflexed from [id number]  Final   Gram Stain   Final    ABUNDANT WBC PRESENT, PREDOMINANTLY PMN MODERATE GRAM POSITIVE RODS RARE GRAM POSITIVE COCCI    Culture   Final    Normal respiratory flora-no Staph aureus or Pseudomonas seen Performed at [facility], [place] [address] [name], [place] [address]    Report Status 08/16/2020 FINAL  Final  Resp Panel by RT-PCR (Flu A&B, Covid) Nasopharyngeal Swab     Status: None   Collection Time: 08/13/20 12:46 PM   Specimen: Nasopharyngeal Swab; Nasopharyngeal(NP) swabs in vial transport medium  Result Value Ref Range Status   SARS Coronavirus 2 by RT PCR NEGATIVE NEGATIVE Final    Comment: (NOTE) SARS-CoV-2 target nucleic acids are NOT DETECTED.  The SARS-CoV-2 RNA is generally detectable in upper respiratory specimens during the acute phase of infection. The lowest concentration of SARS-CoV-2 viral copies this assay can detect is 138 copies/mL. A negative result does not preclude SARS-Cov-2 infection and should not be used as the sole basis for treatment or other patient management decisions. A negative result may occur with  improper specimen collection/handling, submission of specimen other than nasopharyngeal swab, presence of viral mutation(s) within the areas targeted by this assay, and inadequate number of viral copies(<138  copies/mL). A negative result must be combined with clinical observations, patient history, and epidemiological information. The expected result is Negative.  Fact Sheet for Patients:  EntrepreneurPulse.com.au  Fact Sheet for Healthcare Providers:  IncredibleEmployment.be  This test is no t yet approved or cleared by the [place] FDA and  has been authorized for detection and/or diagnosis of SARS-CoV-2 by FDA under an Emergency Use Authorization (EUA). This EUA will remain  in effect (meaning this test can be used) for the duration of the COVID-19 declaration under Section 564(b)(1) of the Act, 21 U.S.C.section 360bbb-3(b)(1), unless the authorization is terminated  or revoked sooner.       Influenza A by PCR NEGATIVE NEGATIVE Final   Influenza B by PCR NEGATIVE NEGATIVE Final    Comment: (NOTE) The Xpert Xpress SARS-CoV-2/FLU/RSV plus assay is intended as an aid in the diagnosis of influenza from Nasopharyngeal swab specimens and should not be used as a sole basis for treatment. Nasal washings and aspirates are unacceptable for Xpert Xpress SARS-CoV-2/FLU/RSV testing.  Fact Sheet for Patients: EntrepreneurPulse.com.au  Fact Sheet for Healthcare Providers: IncredibleEmployment.be  This test is not yet approved or cleared by the [place] FDA and has been authorized for detection and/or diagnosis of SARS-CoV-2 by FDA under an Emergency Use Authorization (EUA). This EUA will remain in effect (meaning this test can be used) for the duration of the COVID-19 declaration under Section 564(b)(1) of the Act, 21 U.S.C. section 360bbb-3(b)(1), unless the authorization is terminated or revoked.  Performed at [facility], [place] [address], [place], [place] [address]   MRSA PCR Screening     Status: None   Collection Time: 08/13/20  6:17 PM   Specimen: Nasal Mucosa; Nasopharyngeal  Result Value Ref  Range Status   MRSA by PCR NEGATIVE NEGATIVE Final  Comment:        The GeneXpert MRSA Assay (FDA approved for NASAL specimens only), is one component of a comprehensive MRSA colonization surveillance program. It is not intended to diagnose MRSA infection nor to guide or monitor treatment for MRSA infections. Performed at [facility], [place] [address], [place], [place] [address]          Radiology Studies: [facility] Chest [name] 1V same Day  Result Date: 08/15/2020 CLINICAL DATA:  Shortness of breath EXAM: PORTABLE CHEST 1 VIEW COMPARISON:  08/13/2020 FINDINGS: Cardiac shadow is stable. Left chest wall port and tracheostomy tube are again seen and stable. Chronic fibrotic changes are noted within both lungs. Elevation of the right hemidiaphragm is noted. Increasing opacity left apex is seen consistent with acute on chronic infiltrate. No other focal abnormality is noted. IMPRESSION: Increasing airspace opacity in the left apex consistent with acute on chronic infiltrate. Tubes and lines as described. Electronically Signed   By: Inez Catalina M.D.   On: 08/15/2020 15:35        Scheduled Meds: . Chlorhexidine Gluconate Cloth  6 each Topical Daily  . enoxaparin (LOVENOX) injection  40 mg Subcutaneous Q24H  . feeding supplement (PROSource TF)  45 mL Per Tube Daily  . free water  180 mL Per Tube Q6H  . ipratropium-albuterol  3 mL Nebulization TID  . magnesium oxide  400 mg Per Tube BID  . methylPREDNISolone (SOLU-MEDROL) injection  40 mg Intravenous Q8H  . multivitamin  15 mL Per Tube Daily  . pantoprazole  40 mg Oral Daily  . sertraline  50 mg Oral Daily  . sodium chloride flush  10-40 mL Intracatheter Q12H  . traZODone  100 mg Per Tube QHS  . white petrolatum       Continuous Infusions: . sodium chloride 250 mL (08/16/20 1151)  . ceFEPime (MAXIPIME) IV 2 g (08/16/20 1157)  . feeding supplement (OSMOLITE 1.2 CAL) 75 mL/hr at 08/16/20 1028  . vancomycin       LOS: 3 days        Phillips Climes, MD [facility]   To contact the attending provider between 7A-7P or the covering provider during after hours 7P-7A, please log into the web site [email or web address] and access using universal [place] password for that web site. If you do not have the password, please call the hospital operator.  08/16/2020, 1:04 PM

## 2020-08-17 DIAGNOSIS — A419 Sepsis, unspecified organism: Secondary | ICD-10-CM | POA: Diagnosis not present

## 2020-08-17 DIAGNOSIS — J189 Pneumonia, unspecified organism: Secondary | ICD-10-CM | POA: Diagnosis not present

## 2020-08-17 LAB — IRON AND TIBC
Iron: 100 ug/dL (ref 45–182)
Saturation Ratios: 64 % — ABNORMAL HIGH (ref 17.9–39.5)
TIBC: 155 ug/dL — ABNORMAL LOW (ref 250–450)
UIBC: 55 ug/dL

## 2020-08-17 LAB — GLUCOSE, CAPILLARY
Glucose-Capillary: 311 mg/dL — ABNORMAL HIGH (ref 70–99)
Glucose-Capillary: 255 mg/dL — ABNORMAL HIGH (ref 70–99)
Glucose-Capillary: 284 mg/dL — ABNORMAL HIGH (ref 70–99)
Glucose-Capillary: 317 mg/dL — ABNORMAL HIGH (ref 70–99)
Glucose-Capillary: 282 mg/dL — ABNORMAL HIGH (ref 70–99)
Glucose-Capillary: 266 mg/dL — ABNORMAL HIGH (ref 70–99)
Glucose-Capillary: 227 mg/dL — ABNORMAL HIGH (ref 70–99)

## 2020-08-17 LAB — BASIC METABOLIC PANEL
Anion gap: 3 — ABNORMAL LOW (ref 5–15)
BUN: 22 mg/dL (ref 8–23)
CO2: 41 mmol/L — ABNORMAL HIGH (ref 22–32)
Calcium: 8.6 mg/dL — ABNORMAL LOW (ref 8.9–10.3)
Chloride: 96 mmol/L — ABNORMAL LOW (ref 98–111)
Creatinine, Ser: 0.57 mg/dL — ABNORMAL LOW (ref 0.61–1.24)
GFR, Estimated: >60 mL/min (ref >60)
Glucose, Bld: 357 mg/dL — ABNORMAL HIGH (ref 70–99)
Potassium: 4.7 mmol/L (ref 3.5–5.1)
Sodium: 140 mmol/L (ref 135–145)

## 2020-08-17 LAB — CBC
HCT: 24 % — ABNORMAL LOW (ref 39.0–52.0)
HCT: 31.7 % — ABNORMAL LOW (ref 39.0–52.0)
Hemoglobin: 7 g/dL — ABNORMAL LOW (ref 13.0–17.0)
Hemoglobin: 9.9 g/dL — ABNORMAL LOW (ref 13.0–17.0)
MCH: 27.5 pg (ref 26.0–34.0)
MCH: 27.7 pg (ref 26.0–34.0)
MCHC: 29.2 g/dL — ABNORMAL LOW (ref 30.0–36.0)
MCHC: 31.2 g/dL (ref 30.0–36.0)
MCV: 94.1 fL (ref 80.0–100.0)
MCV: 88.5 fL (ref 80.0–100.0)
Platelets: 147 10*3/uL — ABNORMAL LOW (ref 150–400)
Platelets: 172 10*3/uL (ref 150–400)
RBC: 2.55 MIL/uL — ABNORMAL LOW (ref 4.22–5.81)
RBC: 3.58 MIL/uL — ABNORMAL LOW (ref 4.22–5.81)
RDW: 18.6 % — ABNORMAL HIGH (ref 11.5–15.5)
RDW: 18 % — ABNORMAL HIGH (ref 11.5–15.5)
WBC: 10.8 10*3/uL — ABNORMAL HIGH (ref 4.0–10.5)
WBC: 12.7 10*3/uL — ABNORMAL HIGH (ref 4.0–10.5)
nRBC: 0 % (ref 0.0–0.2)
nRBC: 0.2 % (ref 0.0–0.2)

## 2020-08-17 LAB — LEGIONELLA PNEUMOPHILA SEROGP 1 UR AG: L. pneumophila Serogp 1 Ur Ag: NEGATIVE

## 2020-08-17 LAB — MAGNESIUM: Magnesium: 1.9 mg/dL (ref 1.7–2.4)

## 2020-08-17 LAB — PREPARE RBC (CROSSMATCH)

## 2020-08-17 MED ORDER — SODIUM CHLORIDE 0.9% IV SOLUTION
Freq: Once | INTRAVENOUS | Status: AC
Start: 2020-08-17 — End: 2020-08-17

## 2020-08-17 MED ORDER — PROSOURCE TF PO LIQD
45.0000 mL | Freq: Three times a day (TID) | ORAL | Status: DC
  Administered 2020-08-17 – 2020-08-25 (×24): 45 mL
  Filled 2020-08-17 (×23): qty 45

## 2020-08-17 MED ORDER — INSULIN GLARGINE 100 UNIT/ML ~~LOC~~ SOLN
15.0000 [IU] | Freq: Every day | SUBCUTANEOUS | Status: DC
  Administered 2020-08-17 – 2020-08-21 (×5): 15 [IU] via SUBCUTANEOUS
  Filled 2020-08-17 (×6): qty 0.15

## 2020-08-17 MED ORDER — FUROSEMIDE 10 MG/ML IJ SOLN
20.0000 mg | Freq: Once | INTRAMUSCULAR | Status: AC
Start: 2020-08-17 — End: 2020-08-17
  Administered 2020-08-17: 20 mg via INTRAVENOUS
  Filled 2020-08-17: qty 2

## 2020-08-17 MED ORDER — MAGNESIUM SULFATE 2 GM/50ML IV SOLN
2.0000 g | Freq: Once | INTRAVENOUS | Status: AC
  Administered 2020-08-17: 2 g via INTRAVENOUS
  Filled 2020-08-17: qty 50

## 2020-08-17 MED ORDER — GLUCERNA 1.2 CAL PO LIQD
1000.0000 mL | ORAL | Status: DC
  Administered 2020-08-18 – 2020-08-25 (×8): 1000 mL
  Filled 2020-08-17 (×13): qty 1000

## 2020-08-17 NOTE — Progress Notes (Signed)
Nutrition Follow-up  DOCUMENTATION CODES:   Severe malnutrition in context of chronic illness  INTERVENTION:   Continue TF via PEG: Change to Glucerna 1.2 at 60 ml/h (1440 ml daily) Prosource TF 45 ml TID  Provides: 1848 kcal, 119 gm protein, 1159 ml free water daily.  Free water flushes 180 ml every 6 hours for a total of 1879 ml free water daily.  NUTRITION DIAGNOSIS:   Severe Malnutrition related to chronic illness (pulmonary fibrosis, Hodgkin's lymphoma) as evidenced by severe muscle depletion,severe fat depletion.  Ongoing   GOAL:   Patient will meet greater than or equal to 90% of their needs  Met with TF  MONITOR:   TF tolerance,Labs,Skin,Diet advancement  REASON FOR ASSESSMENT:   Consult Enteral/tube feeding initiation and management  ASSESSMENT:   65 yo male admitted with SOB. PMH includes chronic trach, COVID-19 ARDS, pulmonary fibrosis, PEG, HTN, DM-2, Hodgkin's lymphoma on chemo, GERD, pancreatitis.  Discussed patient in ICU rounds and with RN today. Patient required intubation via trach and transfer to the ICU on 5/8. He did not tolerate weaning or trach collar this morning.  Receiving second unit of blood today. Remains on vent support. MV: 15.2 L/min Temp (24hrs), Avg:98.4 F (36.9 C), Min:98 F (36.7 C), Max:98.7 F (37.1 C)   Labs reviewed.  CBG: [phone]  Medications reviewed and include Novolog, Lantus, Mag-Ox, solu-medrol, liquid MVI.  Weight down to 61.2 kg today.  Currently receiving Osmolite 1.2 at 75 ml/h via PEG with Prosource TF 45 ml once daily to provide 2200 kcal, 111 gm protein, 1476 ml free water daily. Free water flushes 180 ml every 6 hours. Tolerating TF well.   Diet Order:   Diet Order    None      EDUCATION NEEDS:   Not appropriate for education at this time  Skin:  Skin Assessment: Skin Integrity Issues: Skin Integrity Issues:: DTI DTI: sacrum Stage II: N/A  Last BM:  5/8  Height:   Ht Readings  from Last 1 Encounters:  08/13/20 _0  (1.651 m)    Weight:   Wt Readings from Last 1 Encounters:  08/17/20 61.2 kg    BMI:  Body mass index is 22.45 kg/m.  Estimated Nutritional Needs:   Kcal:  1735  Protein:  100-120 gm  Fluid:  >/= 2 L    Steve [name], RD, LDN, CNSC Please refer to Amion for contact information.

## 2020-08-17 NOTE — Progress Notes (Signed)
NAME:  Steve [name], MRN:  [id number], DOB:  [birth year], LOS: 4 ADMISSION DATE:  08/13/2020, CONSULTATION DATE: 08/14/2020 REFERRING MD: Triad, CHIEF COMPLAINT: Increasing shortness of breath  History of Present Illness:  65 year old male well-known to the pulmonary practice with chronic trach in the setting of COVID-19 in 2021 developed  acute respiratory distress syndrome, complicated by pulmonary fibrosis requiring chronic tracheostomy.  This is further complicated by his Hodgkin's lymphoma requiring chemotherapy.  He was in his usual state of health using 8 L of oxygen via his tracheostomy until 3 to 4 days ago he noticed increasing sputum production, difficulty clearing his airway, and increasing FiO2 needs with saturations declining.  He presented to [facility] his FiO2 was increased to 60% via high flow trach collar he is now saturating 97%.  He does have bilateral wheezes and coarse rhonchi.  He has a fenestrated trach in place.  Currently on antimicrobial therapy, high flow FiO2 and appears to be responding to therapy.  Pertinent  Medical History    has a past medical history of Abscess of muscle (08/10/2011), Acute on chronic respiratory failure with hypoxia ([place]), Acute respiratory distress syndrome (ARDS) due to COVID-19 virus ([place]), Anxiety, Diabetes mellitus without complication ([place]), GERD (gastroesophageal reflux disease), HCAP (healthcare-associated pneumonia), Hip dysplasia, congenital, Hodgkin lymphoma ([place]), Hypertension, Pancreatitis, Pneumothorax, acute, Postoperative anemia due to acute blood loss (09/07/2012), Septic arthritis of hip ([place]) (09/05/2012), and Sleep apnea.  Significant Hospital Events: Including procedures, antibiotic start and stop dates in addition to other pertinent events   . 5/5 Admit with pneumonia.  Treated with ceftriaxone, azithromycin . 5/8 Increasing O2 requirements, worsening chest x-ray.  Changed to #6 cuffed trach and transferred to ICU for  vent.  Antibiotics broadened to vancomycin, cefepime .   Interim History / Subjective:   Looks comfortable today morning.  On the vent No acute events over night  Objective   Blood pressure 107/74, pulse 88, temperature 98.5 F (36.9 C), temperature source Oral, resp. rate (!) 30, height 5\' 5"  (1.651 m), weight 61.2 kg, SpO2 100 %.    Vent Mode: PRVC FiO2 (%):  [40 %-100 %] 60 % Set Rate:  [14 bmp-30 bmp] 30 bmp Vt Set:  [500 mL] 500 mL PEEP:  [5 cmH20] 5 cmH20 Plateau Pressure:  [24 cmH20-29 cmH20] 28 cmH20   Intake/Output Summary (Last 24 hours) at 08/17/2020 0741 Last data filed at 08/17/2020 0700 Gross per 24 hour  Intake 4734.34 ml  Output 2475 ml  Net 2259.34 ml   Filed Weights   08/13/20 2024 08/16/20 0549 08/17/20 0533  Weight: 61.6 kg 65.1 kg 61.2 kg    Examination: Gen:      No acute distress, chronically ill-appearing HEENT:  EOMI, sclera anicteric Neck:     No masses; no thyromegaly, ETT Lungs:    Clear to auscultation bilaterally; normal respiratory effort CV:         Regular rate and rhythm; no murmurs Abd:      + bowel sounds; soft, non-tender; no palpable masses, no distension Ext:    No edema; adequate peripheral perfusion Skin:      Warm and dry; no rash Neuro: Weak on the vent, responsive.  Watching TV  Labs/imaging that I havepersonally reviewed  (right click and "Reselect all SmartList Selections" daily)   Chest x-ray 08/15/2020-increasing airspace opacity in the left upper lobe.  No new imaging today  Glucose 357, creatinine 0.57, hemoglobin 7  Resolved Hospital Problem list  Assessment & Plan:  Acute on chronic hypoxic respiratory failure in setting of tracheostomy requirement from COVID-19 in 2021 with frequent admissions for hypoxia and increased tracheal secretions.  Positive for ARDS with questionable pneumonitis from Brenttuximab complicated by his Hodgkin's lymphoma treated with current brent rituximab presents to [facility] 09/2020  increasing respiratory distress.  Transferred to ICU for worsening respiratory failure, presumed hospital-acquired pneumonia Continue Vanco, cefepime Wean down O2 and try trach collar later today Follow intermittent chest x-ray  Hodgkin's lymphoma Anemia due to chronic illness Transfuse 2 units PRBC.  No evidence of active bleed Follow CBC Per oncology  Generalized failure to thrive in the setting of ARDS, COVID-19, trach dependent and malnutrition. Continue to monitor  Gastroesophageal reflux disease PPI May need repeat swallowing evaluation  Best practice (right click and "Reselect all SmartList Selections" daily)  Diet:  Tube Feed  Pain/Anxiety/Delirium protocol (if indicated): Yes (RASS goal 0) VAP protocol (if indicated): Yes DVT prophylaxis: LMWH GI prophylaxis: PPI Glucose control:  SSI No Central venous access:  N/A Arterial line:  N/A Foley:  N/A Mobility:  bed rest  PT consulted: N/A Last date of multidisciplinary goals of care discussion [ ]  Code Status:  full code Disposition: ICU  Critical care time:    The patient is critically ill with multiple organ system failure and requires high complexity decision making for assessment and support, frequent evaluation and titration of therapies, advanced monitoring, review of radiographic studies and interpretation of complex data.   Critical Care Time devoted to patient care services, exclusive of separately billable procedures, described in this note is 45 minutes.   Marshell Garfinkel MD [place] [facility] See Amion for pager  If no response to pager , please call [phone] until 7pm After 7:00 pm call Elink  [phone] 08/17/2020, 7:41 AM

## 2020-08-17 NOTE — Progress Notes (Signed)
Inpatient Diabetes Program Recommendations  AACE/ADA: New Consensus Statement on Inpatient Glycemic Control (2015)  Target Ranges:  Prepandial:   less than 140 mg/dL      Peak postprandial:   less than 180 mg/dL (1-2 hours)      Critically ill patients:  140 - 180 mg/dL   Lab Results  Component Value Date   GLUCAP 284 (H) 08/17/2020   HGBA1C 5.8 (H) 05/25/2020    Review of Glycemic Control Results for ARI, BERNABEI (MRN [id number]) as of 08/17/2020 12:34  Ref. Range 08/16/2020 19:54 08/16/2020 23:44 08/17/2020 03:23 08/17/2020 08:02 08/17/2020 11:09  Glucose-Capillary Latest Ref Range: 70 - 99 mg/dL 249 (H) 242 (H) 311 (H) 255 (H) 284 (H)   Diabetes history: None  Current orders for Inpatient glycemic control:  Novolog moderate q 4 hours Lantus 15 units daily Solumedrol 40 mg IV q 8 hours Osmolite 1.2- 75 ml/hr Inpatient Diabetes Program Recommendations:   Please consider adding Novolog 3 units q 4 hours to cover CHO in tube feeds.   Thanks,  Adah Perl, RN, BC-ADM Inpatient Diabetes Coordinator Pager [phone] (8a-5p)

## 2020-08-17 NOTE — Plan of Care (Signed)
Patient was able to work with PT today and sit edge of bed without issue. The patient understands the plan of care and treatments he is receiving. Patient has a PEG tube that has maintained tube feeds throughout the shift and will be changed based on dietary recommendations. Patient has remained free from injury during this shift and demonstrates proper use of call bell and asking for help when needed.    Problem: Education: Goal: Knowledge of General Education information will improve Description: Including pain rating scale, medication(s)/side effects and non-pharmacologic comfort measures Outcome: Progressing   Problem: Activity: Goal: Risk for activity intolerance will decrease Outcome: Progressing   Problem: Nutrition: Goal: Adequate nutrition will be maintained Outcome: Progressing   Problem: Safety: Goal: Ability to remain free from injury will improve Outcome: Progressing

## 2020-08-17 NOTE — Progress Notes (Addendum)
RT attempted to place pt on trach collar (10L 98%) RT informed pt on what was taking place, pt understood. Pt's HR increased to 150's along with increase in RR to 50's. Pt stated he could not breathe. RT placed pt back on ventilator full support. Pt had audible cuff leak. Pt's cuff is MOV. RT suctioned pt twice with moderate amount of thick pink tan secretions. Audible cuff leak is now gone and pt appears comfortable now with SVS. RN at bedside with RT. RT will continue to monitor pt.

## 2020-08-17 NOTE — Progress Notes (Signed)
Pharmacy Electrolyte Replacement  Recent Labs:  Recent Labs    08/17/20 0326  K 4.7  MG 1.9  CREATININE 0.57*    Low Critical Values (K </= 2.5, Phos </= 1, Mg </= 1) Present: None  MD Contacted: N/a  Plan: Note patient on Magox 400mg  BID. Will give Magnesium 2g IV x1 this AM as patient receiving Lasix today.   Sloan Leiter, PharmD, BCPS, BCCCP Clinical Pharmacist Please refer to [facility] for [place] numbers 08/17/2020, 9:37 AM

## 2020-08-17 NOTE — Progress Notes (Signed)
Blood just reached patient. Having issues with IV pump.

## 2020-08-17 NOTE — Evaluation (Signed)
Physical Therapy Evaluation Patient Details Name: Steve [name] MRN: [id number] DOB: [birth year] Today's Date: 08/17/2020   History of Present Illness  65 y.o. male presents to [facility] ED on 08/13/2020 with complaints of SOB. Pt is on 8L supplemental oxygen at baseline but reports sats in 70s at home. Pt admitted for suspected PNA and management of acute on chronic respiratory failure. Possible respiratory compromise due to chemotherapy drug.  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  Clinical Impression  Pt presents to PT with decr mobility due to weakness with frequent hospitalizations. Per pt he uses w/c at home and his wife assists him with lateral scooting transfer to w/c. Expect he will make steady progress as medical status improves. Will focus on bed mobility and bed to chair transfers for expected return to home.     Follow Up Recommendations Home health PT;Supervision/Assistance - 24 hour    Equipment Recommendations  None recommended by PT    Recommendations for Other Services       Precautions / Restrictions        Mobility  Bed Mobility Overal bed mobility: Needs Assistance Bed Mobility: Supine to Sit;Sit to Supine     Supine to sit: Mod assist;+2 for safety/equipment Sit to supine: Mod assist;+2 for safety/equipment   General bed mobility comments: Assist to bring legs off of bed, elevate trunk into sitting, and bring hips to EOB. Assist to lower trunk and bring legs back into the bed    Transfers Overall transfer level: Needs assistance Equipment used: None Transfers: Lateral/Scoot Transfers          Lateral/Scoot Transfers: +2 physical assistance;Min assist General transfer comment: Pt scooted up side of bed  Ambulation/Gait             General Gait Details: Pt nonambulatory at baseline  Stairs            Wheelchair Mobility    Modified Rankin (Stroke Patients Only)        Balance Overall balance assessment: Needs assistance Sitting-balance support: Feet unsupported;Bilateral upper extremity supported Sitting balance-Leahy Scale: Poor Sitting balance - Comments: UE support                                     Pertinent Vitals/Pain Pain Assessment: No/denies pain    Home Living Family/patient expects to be discharged to:: Private residence Living Arrangements: Spouse/significant other Available Help at Discharge: Family;Personal care attendant;Available 24 hours/day Type of Home: Mobile home Home Access: Ramped entrance     Home Layout: One level Home Equipment: Hospital bed;Wheelchair - [employer] - 2 wheels      Prior Function Level of Independence: Needs assistance   Gait / Transfers Assistance Needed: Pt reports he does scooting transfer to w/c and can propel w/c on his own           Hand Dominance   Dominant Hand: Right    Extremity/Trunk Assessment   Upper Extremity Assessment Upper Extremity Assessment: Generalized weakness    Lower Extremity Assessment Lower Extremity Assessment: RLE deficits/detail;LLE deficits/detail RLE Deficits / Details: strength <3/5 LLE Deficits / Details: strength <3/5       Communication   Communication: Tracheostomy;Expressive difficulties (pt mouthing mostly, vocalized a few words)  Cognition Arousal/Alertness: Awake/alert Behavior During Therapy: WFL for tasks assessed/performed Overall Cognitive Status: Difficult to assess  General Comments: appears intact. Follow commands. Answering questions      General Comments General comments (skin integrity, edema, etc.): VSS on vent    Exercises     Assessment/Plan    PT Assessment Patient needs continued PT services  PT Problem List Decreased strength;Decreased balance;Decreased mobility;Cardiopulmonary status limiting activity       PT Treatment Interventions DME  instruction;Functional mobility training;Therapeutic activities;Therapeutic exercise;Balance training;Patient/family education    PT Goals (Current goals can be found in the Care Plan section)  Acute Rehab PT Goals Patient Stated Goal: not stated PT Goal Formulation: With patient Time For Goal Achievement: 08/31/20 Potential to Achieve Goals: Good    Frequency Min 3X/week   Barriers to discharge        Co-evaluation               AM-PAC PT "6 Clicks" Mobility  Outcome Measure Help needed turning from your back to your side while in a flat bed without using bedrails?: A Lot Help needed moving from lying on your back to sitting on the side of a flat bed without using bedrails?: A Lot Help needed moving to and from a bed to a chair (including a wheelchair)?: Total Help needed standing up from a chair using your arms (e.g., wheelchair or bedside chair)?: Total Help needed to walk in hospital room?: Total Help needed climbing 3-5 steps with a railing? : Total 6 Click Score: 8    End of Session   Activity Tolerance: Patient tolerated treatment well Patient left: in bed;with call bell/phone within reach;with bed alarm set   PT Visit Diagnosis: Other abnormalities of gait and mobility (R26.89)    Time: 3419-3790 PT Time Calculation (min) (ACUTE ONLY): 13 min   Charges:   PT Evaluation $PT Eval Moderate Complexity: 1 [place] Pager [phone] Office [place] 08/17/2020, 6:23 PM

## 2020-08-17 NOTE — Progress Notes (Signed)
Mr. Blansett seems to be comfortable this morning.  He is on the ventilator.  This is #through the tracheotomy.  His hemoglobin is only 7.  I really do think he needs to be transfused.  I would go ahead and give him a couple of units.  I really think this might help his pulmonary status.  I just have to believe that there is some relationship to his pulmonary compromise along with the Brentuximab.  I know that Brentuximab can cause some pneumonitis.  I think this is quite rare.  His white cell count is 10.8.  Hemoglobin 7.  Platelet count 147,000.Marland Kitchen  Blood sugar is on the high side secondary to steroids.  His blood culture came back positive for Staph.  Not sure if this is a true pathogen or not.  So far, sputum cultures have not come back positive.  As far as his Hodgkin's is concerned, I will have to rethink how we can try to treat this.  We may actually have to think about chemotherapy that will not affect his pulmonary function.  This can be done although there is certainly would be other side effects.  I know that he is getting outstanding care from all staff down the ICU.  Of note, I do think that there are 621 M&Ms in the bottle.  Please let me know when I win!!!  Lattie Haw, MD  Oswaldo Milian 41:10

## 2020-08-18 DIAGNOSIS — J189 Pneumonia, unspecified organism: Secondary | ICD-10-CM | POA: Diagnosis not present

## 2020-08-18 DIAGNOSIS — A419 Sepsis, unspecified organism: Secondary | ICD-10-CM | POA: Diagnosis not present

## 2020-08-18 LAB — CULTURE, BLOOD (ROUTINE X 2): Culture: NO GROWTH

## 2020-08-18 LAB — BASIC METABOLIC PANEL
Anion gap: 5 (ref 5–15)
BUN: 26 mg/dL — ABNORMAL HIGH (ref 8–23)
CO2: 41 mmol/L — ABNORMAL HIGH (ref 22–32)
Calcium: 8.6 mg/dL — ABNORMAL LOW (ref 8.9–10.3)
Chloride: 92 mmol/L — ABNORMAL LOW (ref 98–111)
Creatinine, Ser: 0.55 mg/dL — ABNORMAL LOW (ref 0.61–1.24)
GFR, Estimated: >60 mL/min (ref >60)
Glucose, Bld: 250 mg/dL — ABNORMAL HIGH (ref 70–99)
Potassium: 4.3 mmol/L (ref 3.5–5.1)
Sodium: 138 mmol/L (ref 135–145)

## 2020-08-18 LAB — BPAM RBC
Blood Product Expiration Date: 202206012359
Blood Product Expiration Date: 202206012359
ISSUE DATE / TIME: 202205090807
ISSUE DATE / TIME: 202205091041
Unit Type and Rh: 600
Unit Type and Rh: 600

## 2020-08-18 LAB — GLUCOSE, CAPILLARY
Glucose-Capillary: 244 mg/dL — ABNORMAL HIGH (ref 70–99)
Glucose-Capillary: 150 mg/dL — ABNORMAL HIGH (ref 70–99)
Glucose-Capillary: 221 mg/dL — ABNORMAL HIGH (ref 70–99)
Glucose-Capillary: 129 mg/dL — ABNORMAL HIGH (ref 70–99)
Glucose-Capillary: 90 mg/dL (ref 70–99)
Glucose-Capillary: 167 mg/dL — ABNORMAL HIGH (ref 70–99)

## 2020-08-18 LAB — TYPE AND SCREEN
ABO/RH(D): A NEG
Antibody Screen: NEGATIVE
Unit division: 0
Unit division: 0

## 2020-08-18 LAB — CBC
HCT: 31.3 % — ABNORMAL LOW (ref 39.0–52.0)
Hemoglobin: 9.6 g/dL — ABNORMAL LOW (ref 13.0–17.0)
MCH: 27.4 pg (ref 26.0–34.0)
MCHC: 30.7 g/dL (ref 30.0–36.0)
MCV: 89.4 fL (ref 80.0–100.0)
Platelets: 169 10*3/uL (ref 150–400)
RBC: 3.5 MIL/uL — ABNORMAL LOW (ref 4.22–5.81)
RDW: 18.3 % — ABNORMAL HIGH (ref 11.5–15.5)
WBC: 10.7 10*3/uL — ABNORMAL HIGH (ref 4.0–10.5)
nRBC: 0.4 % — ABNORMAL HIGH (ref 0.0–0.2)

## 2020-08-18 LAB — MAGNESIUM: Magnesium: 2.2 mg/dL (ref 1.7–2.4)

## 2020-08-18 LAB — PHOSPHORUS
Phosphorus: 1.6 mg/dL — ABNORMAL LOW (ref 2.5–4.6)
Phosphorus: 2.9 mg/dL (ref 2.5–4.6)

## 2020-08-18 MED ORDER — INSULIN ASPART 100 UNIT/ML IJ SOLN
4.0000 [IU] | INTRAMUSCULAR | Status: DC
  Administered 2020-08-18 – 2020-08-22 (×17): 4 [IU] via SUBCUTANEOUS

## 2020-08-18 MED ORDER — BUDESONIDE 0.25 MG/2ML IN SUSP
0.2500 mg | Freq: Two times a day (BID) | RESPIRATORY_TRACT | Status: DC
  Administered 2020-08-18 – 2020-08-25 (×14): 0.25 mg via RESPIRATORY_TRACT
  Filled 2020-08-18 (×14): qty 2

## 2020-08-18 MED ORDER — SODIUM PHOSPHATES 45 MMOLE/15ML IV SOLN
45.0000 mmol | Freq: Once | INTRAVENOUS | Status: AC
  Administered 2020-08-18: 45 mmol via INTRAVENOUS
  Filled 2020-08-18: qty 15

## 2020-08-18 MED ORDER — METHYLPREDNISOLONE SODIUM SUCC 40 MG IJ SOLR
40.0000 mg | Freq: Two times a day (BID) | INTRAMUSCULAR | Status: DC
  Administered 2020-08-18 – 2020-08-24 (×12): 40 mg via INTRAVENOUS
  Filled 2020-08-18 (×13): qty 1

## 2020-08-18 MED ORDER — ARFORMOTEROL TARTRATE 15 MCG/2ML IN NEBU
15.0000 ug | INHALATION_SOLUTION | Freq: Two times a day (BID) | RESPIRATORY_TRACT | Status: DC
  Administered 2020-08-18 – 2020-08-25 (×14): 15 ug via RESPIRATORY_TRACT
  Filled 2020-08-18 (×15): qty 2

## 2020-08-18 NOTE — Progress Notes (Signed)
RT removed pt from full support through ventilator and placed pt on trach collar. Pt tolerating well at this time with SVS. RN made aware. RT will continue to monitor pt.

## 2020-08-18 NOTE — Progress Notes (Signed)
RT was called by RN stating that pt's SpO2 was reading consistent at 86%. RT at bedside to find pt's SpO2 at 86%. Pt was is no distress. RT suctioned pt with moderate amount of yellow/tan secretions. RT also changed pt's inner cannula with no improvement to pt's SpO2. RT placed pt back on full support through the ventilator. RT lavaged suction pt with moderate amount of yellow/tan thick secretions. Pt appears comfortable with SVS and SpO2 of 100%. Pt still has audible cuff leak.

## 2020-08-18 NOTE — Progress Notes (Signed)
eLink Physician-Brief Progress Note Patient Name: ARNEY MAYABB DOB: [birth year] MRN: [id number]   Date of Service  08/18/2020  HPI/Events of Note  Notified of low phos at 1.6, K 4.3, crea 0.55.  eICU Interventions  Replete phos with as per protocol.         Elsie Lincoln 08/18/2020, 5:39 AM

## 2020-08-18 NOTE — Progress Notes (Signed)
NAME:  Steve [name], MRN:  [id number], DOB:  [birth year], LOS: 5 ADMISSION DATE:  08/13/2020, CONSULTATION DATE: 08/14/2020 REFERRING MD: Triad, CHIEF COMPLAINT: Increasing shortness of breath  History of Present Illness:  65 year old male well-known to the pulmonary practice with chronic trach in the setting of COVID-19 in 2021 developed  acute respiratory distress syndrome, complicated by pulmonary fibrosis requiring chronic tracheostomy.  This is further complicated by his Hodgkin's lymphoma requiring chemotherapy.  He was in his usual state of health using 8 L of oxygen via his tracheostomy until 3 to 4 days ago he noticed increasing sputum production, difficulty clearing his airway, and increasing FiO2 needs with saturations declining.  He presented to [facility] his FiO2 was increased to 60% via high flow trach collar he is now saturating 97%.  He does have bilateral wheezes and coarse rhonchi.  He has a fenestrated trach in place.  Currently on antimicrobial therapy, high flow FiO2 and appears to be responding to therapy.  Pertinent  Medical History    has a past medical history of Abscess of muscle (08/10/2011), Acute on chronic respiratory failure with hypoxia ([place]), Acute respiratory distress syndrome (ARDS) due to COVID-19 virus ([place]), Anxiety, Diabetes mellitus without complication ([place]), GERD (gastroesophageal reflux disease), HCAP (healthcare-associated pneumonia), Hip dysplasia, congenital, Hodgkin lymphoma ([place]), Hypertension, Pancreatitis, Pneumothorax, acute, Postoperative anemia due to acute blood loss (09/07/2012), Septic arthritis of hip ([place]) (09/05/2012), and Sleep apnea.  Significant Hospital Events: Including procedures, antibiotic start and stop dates in addition to other pertinent events   . 5/5 Admit with pneumonia.  Treated with ceftriaxone, azithromycin . 5/8 Increasing O2 requirements, worsening chest x-ray.  Changed to #6 cuffed trach and transferred to ICU for  vent.  Antibiotics broadened to vancomycin, cefepime .   Interim History / Subjective:   No acute events overnight.  Transition to trach collar today morning  Objective   Blood pressure 118/73, pulse (!) 102, temperature 98 F (36.7 C), temperature source Oral, resp. rate (!) 31, height 5\' 5"  (1.651 m), weight 61.2 kg, SpO2 95 %.    Vent Mode: PRVC FiO2 (%):  [50 %-80 %] 80 % Set Rate:  [30 bmp] 30 bmp Vt Set:  [500 mL] 500 mL PEEP:  [5 cmH20] 5 cmH20 Plateau Pressure:  [20 cmH20-28 cmH20] 20 cmH20   Intake/Output Summary (Last 24 hours) at 08/18/2020 1309 Last data filed at 08/18/2020 1200 Gross per 24 hour  Intake 2974.72 ml  Output 2575 ml  Net 399.72 ml   Filed Weights   08/13/20 2024 08/16/20 0549 08/17/20 0533  Weight: 61.6 kg 65.1 kg 61.2 kg    Examination: Gen:      No acute distress HEENT:  EOMI, sclera anicteric Neck:     No masses; no thyromegaly, trach Lungs:    Clear to auscultation bilaterally; normal respiratory effort CV:         Regular rate and rhythm; no murmurs Abd:      + bowel sounds; soft, non-tender; no palpable masses, no distension Ext:    No edema; adequate peripheral perfusion Skin:      Warm and dry; no rash Neuro: Somnolent, arousable  Labs/imaging that I havepersonally reviewed  (right click and "Reselect all SmartList Selections" daily)   Glucose 250, creatinine 0.55 CBC is stable No new imaging  Resolved Hospital Problem list     Assessment & Plan:  Acute on chronic hypoxic respiratory failure in setting of tracheostomy requirement from COVID-19 in 2021 with frequent  admissions for hypoxia and increased tracheal secretions.  Positive for ARDS with questionable pneumonitis from Brenttuximab complicated by his Hodgkin's lymphoma treated with current brent rituximab presents to [facility] 09/2020 increasing respiratory distress.  Transferred to ICU for worsening respiratory failure, presumed hospital-acquired pneumonia Stop Vanco as  MRSA swab is negative.  Continue cefepime for total 7-day Trach collar trials as tolerated Follow intermittent chest x-ray  Hodgkin's lymphoma Anemia due to chronic illness s/p 2 units PRBC Follow CBC Per oncology  Generalized failure to thrive in the setting of ARDS, COVID-19, trach dependent and malnutrition. Continue to monitor  Gastroesophageal reflux disease PPI May need repeat swallowing evaluation  Diabetes Taper steroids to 40 mg Solu-Medrol every 12 Increase Lantus, tube feed coverage added  Best practice (right click and "Reselect all SmartList Selections" daily)  Diet:  Tube Feed  Pain/Anxiety/Delirium protocol (if indicated): Yes (RASS goal 0) VAP protocol (if indicated): Yes DVT prophylaxis: LMWH GI prophylaxis: PPI Glucose control:  SSI No Central venous access:  N/A Arterial line:  N/A Foley:  N/A Mobility:  bed rest  PT consulted: N/A Last date of multidisciplinary goals of care discussion [ ]  Code Status:  full code Disposition: ICU  Critical care time:    The patient is critically ill with multiple organ system failure and requires high complexity decision making for assessment and support, frequent evaluation and titration of therapies, advanced monitoring, review of radiographic studies and interpretation of complex data.   Critical Care Time devoted to patient care services, exclusive of separately billable procedures, described in this note is 45 minutes.   Marshell Garfinkel MD [place] [facility] See Amion for pager  If no response to pager , please call [phone] until 7pm After 7:00 pm call Elink  [phone] 08/18/2020, 1:09 PM

## 2020-08-19 ENCOUNTER — Telehealth: Payer: Self-pay | Admitting: *Deleted

## 2020-08-19 DIAGNOSIS — J189 Pneumonia, unspecified organism: Secondary | ICD-10-CM | POA: Diagnosis not present

## 2020-08-19 DIAGNOSIS — A419 Sepsis, unspecified organism: Secondary | ICD-10-CM | POA: Diagnosis not present

## 2020-08-19 LAB — GLUCOSE, CAPILLARY
Glucose-Capillary: 163 mg/dL — ABNORMAL HIGH (ref 70–99)
Glucose-Capillary: 84 mg/dL (ref 70–99)
Glucose-Capillary: 189 mg/dL — ABNORMAL HIGH (ref 70–99)
Glucose-Capillary: 171 mg/dL — ABNORMAL HIGH (ref 70–99)
Glucose-Capillary: 108 mg/dL — ABNORMAL HIGH (ref 70–99)

## 2020-08-19 LAB — CBC
HCT: 32.4 % — ABNORMAL LOW (ref 39.0–52.0)
Hemoglobin: 10 g/dL — ABNORMAL LOW (ref 13.0–17.0)
MCH: 27.9 pg (ref 26.0–34.0)
MCHC: 30.9 g/dL (ref 30.0–36.0)
MCV: 90.5 fL (ref 80.0–100.0)
Platelets: 198 10*3/uL (ref 150–400)
RBC: 3.58 MIL/uL — ABNORMAL LOW (ref 4.22–5.81)
RDW: 17.9 % — ABNORMAL HIGH (ref 11.5–15.5)
WBC: 12.8 10*3/uL — ABNORMAL HIGH (ref 4.0–10.5)
nRBC: 0.5 % — ABNORMAL HIGH (ref 0.0–0.2)

## 2020-08-19 LAB — BASIC METABOLIC PANEL
Anion gap: 8 (ref 5–15)
BUN: 28 mg/dL — ABNORMAL HIGH (ref 8–23)
CO2: 37 mmol/L — ABNORMAL HIGH (ref 22–32)
Calcium: 8.9 mg/dL (ref 8.9–10.3)
Chloride: 91 mmol/L — ABNORMAL LOW (ref 98–111)
Creatinine, Ser: 0.58 mg/dL — ABNORMAL LOW (ref 0.61–1.24)
GFR, Estimated: >60 mL/min (ref >60)
Glucose, Bld: 155 mg/dL — ABNORMAL HIGH (ref 70–99)
Potassium: 4.2 mmol/L (ref 3.5–5.1)
Sodium: 136 mmol/L (ref 135–145)

## 2020-08-19 LAB — PHOSPHORUS: Phosphorus: 3 mg/dL (ref 2.5–4.6)

## 2020-08-19 LAB — MAGNESIUM: Magnesium: 2.2 mg/dL (ref 1.7–2.4)

## 2020-08-19 NOTE — Progress Notes (Addendum)
NAME:  Steve [name], MRN:  [id number], DOB:  Dec 29, 1955, LOS: 6 ADMISSION DATE:  08/13/2020, CONSULTATION DATE: 08/14/2020 REFERRING MD: Triad, CHIEF COMPLAINT: Increasing shortness of breath  History of Present Illness:  65 year old male well-known to the pulmonary practice with chronic trach in the setting of COVID-19 in 2021 developed  acute respiratory distress syndrome, complicated by pulmonary fibrosis requiring chronic tracheostomy.  This is further complicated by his Hodgkin's lymphoma requiring chemotherapy.  He was in his usual state of health using 8 L of oxygen via his tracheostomy until 3 to 4 days ago he noticed increasing sputum production, difficulty clearing his airway, and increasing FiO2 needs with saturations declining.  He presented to [facility] his FiO2 was increased to 60% via high flow trach collar he is now saturating 97%.  He does have bilateral wheezes and coarse rhonchi.  He has a fenestrated trach in place.  Currently on antimicrobial therapy, high flow FiO2 and appears to be responding to therapy.  Pertinent  Medical History    has a past medical history of Abscess of muscle (08/10/2011), Acute on chronic respiratory failure with hypoxia ([place]), Acute respiratory distress syndrome (ARDS) due to COVID-19 virus ([place]), Anxiety, Diabetes mellitus without complication ([place]), GERD (gastroesophageal reflux disease), HCAP (healthcare-associated pneumonia), Hip dysplasia, congenital, Hodgkin lymphoma ([place]), Hypertension, Pancreatitis, Pneumothorax, acute, Postoperative anemia due to acute blood loss (09/07/2012), Septic arthritis of hip ([place]) (09/05/2012), and Sleep apnea.  Significant Hospital Events: Including procedures, antibiotic start and stop dates in addition to other pertinent events   5/5 Admit with pneumonia.  Treated with ceftriaxone, azithromycin 5/8 Increasing O2 requirements, worsening chest x-ray.  Changed to #6 cuffed trach and transferred to ICU for vent.   Antibiotics broadened to vancomycin, cefepime   Interim History / Subjective:   On trach collar today. Rested on vent overnight, A febrile, hemodynamically stable  Objective   Blood pressure 100/68, pulse 66, temperature 98 F (36.7 C), temperature source Oral, resp. rate (!) 30, height 5\' 5"  (1.651 m), weight 61.2 kg, SpO2 100 %.    Vent Mode: PRVC FiO2 (%):  [60 %-80 %] 60 % Set Rate:  [30 bmp] 30 bmp Vt Set:  [500 mL] 500 mL PEEP:  [5 cmH20] 5 cmH20 Plateau Pressure:  [21 cmH20-29 cmH20] 29 cmH20   Intake/Output Summary (Last 24 hours) at 08/19/2020 0842 Last data filed at 08/19/2020 0762 Gross per 24 hour  Intake 2297.99 ml  Output 2900 ml  Net -602.01 ml   Filed Weights   08/13/20 2024 08/16/20 0549 08/17/20 0533  Weight: 61.6 kg 65.1 kg 61.2 kg    Examination: Gen:      No acute distress, chronically ill appearing HEENT:  EOMI, sclera anicteric Neck:     No masses; no thyromegaly, trach Lungs:    Clear to auscultation bilaterally; normal respiratory effort CV:         Regular rate and rhythm; no murmurs Abd:      + bowel sounds; soft, non-tender; no palpable masses, no distension Ext:    No edema; adequate peripheral perfusion Skin:      Warm and dry; no rash Neuro: alert and oriented x 3 Psych: normal mood and affect  Labs/imaging that I havepersonally reviewed  (right click and "Reselect all SmartList Selections" daily)   Labs are stable, WBC slightly up to 12.8. No new imaging  Resolved Hospital Problem list     Assessment & Plan:  Acute on chronic hypoxic respiratory failure in setting  of tracheostomy requirement from COVID-19 in 2021 with frequent admissions for hypoxia and increased tracheal secretions.  Positive for ARDS with questionable pneumonitis from Brenttuximab complicated by his Hodgkin's lymphoma treated with current brent rituximab presents to [facility] 09/2020 increasing respiratory distress.  Transferred to ICU for worsening respiratory  failure, presumed hospital-acquired pneumonia Of Vanco as MRSA swab is negative.  Continue cefepime for total 7-day Trach collar trials as tolerated Follow intermittent chest x-ray Solu-Medrol 40 mg every 12  Hodgkin's lymphoma Anemia due to chronic illness s/p 2 units PRBC Follow CBC Per oncology  Generalized failure to thrive in the setting of ARDS, COVID-19, trach dependent and malnutrition. Continue to monitor  Gastroesophageal reflux disease PPI May need repeat swallowing evaluation  Diabetes Continue Lantus, tube feed coverage  Severe Malnutrition related to chronic illness Nutrition consult  Best practice (right click and "Reselect all SmartList Selections" daily)  Diet:  Tube Feed  Pain/Anxiety/Delirium protocol (if indicated): Yes (RASS goal 0) VAP protocol (if indicated): Yes DVT prophylaxis: LMWH GI prophylaxis: PPI Glucose control:  SSI Yes Central venous access:  N/A Arterial line:  N/A Foley:  N/A Mobility:  bed rest  PT consulted: N/A Last date of multidisciplinary goals of care discussion [ ]  Code Status:  full code Disposition: ICU  Critical care time:    The patient is critically ill with multiple organ system failure and requires high complexity decision making for assessment and support, frequent evaluation and titration of therapies, advanced monitoring, review of radiographic studies and interpretation of complex data.   Critical Care Time devoted to patient care services, exclusive of separately billable procedures, described in this note is 45 minutes.   Marshell Garfinkel MD [facility] See Amion for pager  If no response to pager , please call [phone] until 7pm After 7:00 pm call Elink  [phone] 08/19/2020, 8:42 AM

## 2020-08-19 NOTE — Telephone Encounter (Signed)
Per secure chat 08/19/20 Steve [name] - canceled chemo for 08/26/20 due to hospitalization

## 2020-08-19 NOTE — Progress Notes (Signed)
Physical Therapy Treatment Patient Details Name: Steve [name] MRN: [id number] DOB: 06-02-1955 Today's Date: 08/19/2020    History of Present Illness 65 y.o. male presents to [facility] ED on 08/13/2020 with complaints of SOB. Pt is on 8L supplemental oxygen at baseline but reports sats in 70s at home. Pt admitted for suspected PNA and management of acute on chronic respiratory failure. Possible respiratory compromise due to chemotherapy drug.  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    PT Comments    Pt admitted with above diagnosis. Pt was able to stand and pivot to recliner with mod assist and cues.  Pt has assist at home need to clarify comfort level of family to assist pt at home if he needs mod assist for transfers. Will continue to follow acutely. O2 sat would not pick up well (pleth not good) with activity but 92% on 40% trach collar once in chair.  Pt currently with functional limitations due to balance and endurance deficits. Pt will benefit from skilled PT to increase their independence and safety with mobility to allow discharge to the venue listed below.     Follow Up Recommendations  Home health PT;Supervision/Assistance - 24 hour     Equipment Recommendations  None recommended by PT    Recommendations for Other Services       Precautions / Restrictions Precautions Precaution Comments: 40% trach collar Restrictions Weight Bearing Restrictions: No    Mobility  Bed Mobility Overal bed mobility: Needs Assistance Bed Mobility: Supine to Sit;Sit to Supine     Supine to sit: Mod assist;+2 for safety/equipment Sit to supine: Mod assist;+2 for safety/equipment   General bed mobility comments: Assist to bring legs off of bed, elevate trunk into sitting, and bring hips to EOB.    Transfers Overall transfer level: Needs assistance Equipment used: 2 person hand held assist Transfers: Stand Pivot Transfers;Sit  to/from Stand Sit to Stand: Max assist;+2 physical assistance;From elevated surface Stand pivot transfers: Mod assist;+2 physical assistance       General transfer comment: Pt needed assist to power up and was able to stand to full stand.  Pt keeps feet close together. Pt was able to take pivotal steps to the recliner with posterior lean with PT blocking knees for safety. Pt was instructed to hold onto PT and he was able to step around and then sit safely in chair with mod assist for balance.  Ambulation/Gait             General Gait Details: Pt nonambulatory at baseline   Stairs             Wheelchair Mobility    Modified Rankin (Stroke Patients Only)       Balance Overall balance assessment: Needs assistance Sitting-balance support: Feet unsupported;Bilateral upper extremity supported Sitting balance-Leahy Scale: Poor Sitting balance - Comments: UE support   Standing balance support: Bilateral upper extremity supported;During functional activity Standing balance-Leahy Scale: Poor Standing balance comment: relies on external and UE support                            Cognition Arousal/Alertness: Awake/alert Behavior During Therapy: WFL for tasks assessed/performed Overall Cognitive Status: Difficult to assess                                 General Comments: appears intact.  Follow commands. Answering questions      Exercises General Exercises - Lower Extremity Ankle Circles/Pumps: AROM;Both;10 reps;Seated Long Arc Quad: AROM;Both;10 reps;Seated    General Comments General comments (skin integrity, edema, etc.): VSS on 40% trach collar      Pertinent Vitals/Pain Pain Assessment: No/denies pain    Home Living                      Prior Function            PT Goals (current goals can now be found in the care plan section) Acute Rehab PT Goals Patient Stated Goal: not stated Progress towards PT goals: Progressing  toward goals    Frequency    Min 3X/week      PT Plan Current plan remains appropriate    Co-evaluation              AM-PAC PT "6 Clicks" Mobility   Outcome Measure  Help needed turning from your back to your side while in a flat bed without using bedrails?: A Lot Help needed moving from lying on your back to sitting on the side of a flat bed without using bedrails?: A Lot Help needed moving to and from a bed to a chair (including a wheelchair)?: A Lot Help needed standing up from a chair using your arms (e.g., wheelchair or bedside chair)?: A Lot Help needed to walk in hospital room?: Total Help needed climbing 3-5 steps with a railing? : Total 6 Click Score: 10    End of Session Equipment Utilized During Treatment: Gait belt;Oxygen (trach collar) Activity Tolerance: Patient tolerated treatment well Patient left: with call bell/phone within reach;in chair;with chair alarm set   PT Visit Diagnosis: Other abnormalities of gait and mobility (R26.89)     Time: 2800-3491 PT Time Calculation (min) (ACUTE ONLY): 27 min  Charges:  $Therapeutic Exercise: 8-22 mins $Therapeutic Activity: 8-22 mins                     Candance Bohlman M,PT Acute Rehab Services [phone] (pager)   Alvira Philips 08/19/2020, 1:18 PM

## 2020-08-19 NOTE — Progress Notes (Signed)
Pharmacy Antibiotic Note  Steve [name] is a 65 y.o. male admitted on 08/13/2020.  Pharmacy has been consulted for cefepime dosing for PNA. Scr stable.   Plan: Continue cefepime 2g IV q8h  Monitor clinical progress, c/s, renal function Stop date in for 7 days of therapy per CCM   Height: 5\' 5"  (165.1 cm) Weight: 61.2 kg (134 lb 14.7 oz) IBW/kg (Calculated) : 61.5  Temp (24hrs), Avg:98.1 F (36.7 C), Min:97.9 F (36.6 C), Max:98.4 F (36.9 C)  Recent Labs  Lab 08/13/20 1220 08/13/20 1248 08/13/20 1547 08/14/20 0107 08/15/20 0027 08/16/20 0406 08/17/20 0326 08/17/20 1500 08/18/20 0318 08/19/20 0400  WBC  --    < >  --    < > 14.4* 16.9* 10.8* 12.7* 10.7* 12.8*  CREATININE  --    < >  --    < > 0.55* 0.55* 0.57*  --  0.55* 0.58*  LATICACIDVEN 1.1  --  0.9  --   --   --   --   --   --   --    < > = values in this interval not displayed.    Estimated Creatinine Clearance: 80.8 mL/min (A) (by C-G formula based on SCr of 0.58 mg/dL (L)).    No Known Allergies   Arturo Morton, PharmD, BCPS Please check AMION for all [place] contact numbers Clinical Pharmacist 08/19/2020 8:52 AM

## 2020-08-20 DIAGNOSIS — J189 Pneumonia, unspecified organism: Secondary | ICD-10-CM | POA: Diagnosis not present

## 2020-08-20 DIAGNOSIS — A419 Sepsis, unspecified organism: Secondary | ICD-10-CM | POA: Diagnosis not present

## 2020-08-20 LAB — BASIC METABOLIC PANEL
Anion gap: 4 — ABNORMAL LOW (ref 5–15)
BUN: 26 mg/dL — ABNORMAL HIGH (ref 8–23)
CO2: 36 mmol/L — ABNORMAL HIGH (ref 22–32)
Calcium: 8.8 mg/dL — ABNORMAL LOW (ref 8.9–10.3)
Chloride: 95 mmol/L — ABNORMAL LOW (ref 98–111)
Creatinine, Ser: 0.55 mg/dL — ABNORMAL LOW (ref 0.61–1.24)
GFR, Estimated: >60 mL/min (ref >60)
Glucose, Bld: 145 mg/dL — ABNORMAL HIGH (ref 70–99)
Potassium: 4.2 mmol/L (ref 3.5–5.1)
Sodium: 135 mmol/L (ref 135–145)

## 2020-08-20 LAB — CBC
HCT: 34.9 % — ABNORMAL LOW (ref 39.0–52.0)
Hemoglobin: 10.5 g/dL — ABNORMAL LOW (ref 13.0–17.0)
MCH: 27.3 pg (ref 26.0–34.0)
MCHC: 30.1 g/dL (ref 30.0–36.0)
MCV: 90.9 fL (ref 80.0–100.0)
Platelets: 232 10*3/uL (ref 150–400)
RBC: 3.84 MIL/uL — ABNORMAL LOW (ref 4.22–5.81)
RDW: 19.4 % — ABNORMAL HIGH (ref 11.5–15.5)
WBC: 12.4 10*3/uL — ABNORMAL HIGH (ref 4.0–10.5)
nRBC: 0.4 % — ABNORMAL HIGH (ref 0.0–0.2)

## 2020-08-20 LAB — GLUCOSE, CAPILLARY
Glucose-Capillary: 116 mg/dL — ABNORMAL HIGH (ref 70–99)
Glucose-Capillary: 156 mg/dL — ABNORMAL HIGH (ref 70–99)
Glucose-Capillary: 164 mg/dL — ABNORMAL HIGH (ref 70–99)
Glucose-Capillary: 164 mg/dL — ABNORMAL HIGH (ref 70–99)
Glucose-Capillary: 168 mg/dL — ABNORMAL HIGH (ref 70–99)
Glucose-Capillary: 186 mg/dL — ABNORMAL HIGH (ref 70–99)
Glucose-Capillary: 76 mg/dL (ref 70–99)
Glucose-Capillary: 89 mg/dL (ref 70–99)

## 2020-08-20 LAB — MAGNESIUM: Magnesium: 2.2 mg/dL (ref 1.7–2.4)

## 2020-08-20 LAB — PHOSPHORUS: Phosphorus: 3.2 mg/dL (ref 2.5–4.6)

## 2020-08-20 MED ORDER — SIMETHICONE 80 MG PO CHEW
80.0000 mg | CHEWABLE_TABLET | Freq: Four times a day (QID) | ORAL | Status: DC | PRN
  Filled 2020-08-20: qty 1

## 2020-08-20 NOTE — Progress Notes (Signed)
NAME:  Steve [name], MRN:  [id number], DOB:  September 29, 1955, LOS: 7 ADMISSION DATE:  08/13/2020, CONSULTATION DATE: 08/14/2020 REFERRING MD: Triad, CHIEF COMPLAINT: Increasing shortness of breath  History of Present Illness:  65 year old male, well-known to [facility] with chronic trach in the setting of COVID-19 (2021) c/b ARDS with development of pulmonary fibrosis requiring chronic tracheostomy. PMHx significant for HTN, GERD, septic arthritis (hip) and Hodgkin's lymphoma (requiring chemotherapy).    Patient was in his usual state of health (using 8L O2 via fenestrated trach) until 3-4 days prior to admission when he noticed increasing sputum production, difficulty clearing his airway and increasing FiO2 needs with decreased O2 saturation. Presented to [facility]; FiO2 was increased to 60% via high flow trach collar with improvement in O2 saturation. Remains on antimicrobial therapy and high flow FiO2 with clinical improvement.  Pertinent Medical History:    has a past medical history of Abscess of muscle (08/10/2011), Acute on chronic respiratory failure with hypoxia ([place]), Acute respiratory distress syndrome (ARDS) due to COVID-19 virus ([place]), Anxiety, Diabetes mellitus without complication ([place]), GERD (gastroesophageal reflux disease), HCAP (healthcare-associated pneumonia), Hip dysplasia, congenital, Hodgkin lymphoma ([place]), Hypertension, Pancreatitis, Pneumothorax, acute, Postoperative anemia due to acute blood loss (09/07/2012), Septic arthritis of hip ([place]) (09/05/2012), and Sleep apnea.  Significant Hospital Events: Including procedures, antibiotic start and stop dates in addition to other pertinent events   . 5/5 Admit with pneumonia.  Treated with ceftriaxone, azithromycin . 5/8 Increasing O2 requirements, worsening chest x-ray.  Changed to #6 cuffed trach and transferred to ICU for vent.  Antibiotics broadened to vancomycin, cefepime . 5/12 O2 sats remain improved, remains on ATC without  issues  Interim History / Subjective:  No significant events overnight Feeling well, sitting up in bed watching videos on his phone Tolerating ATC without issues Reports some gas, otherwise no compaints  Objective   Blood pressure 97/69, pulse 74, temperature 98.2 F (36.8 C), temperature source Oral, resp. rate (!) 8, height 5\' 5"  (1.651 m), weight 61.2 kg, SpO2 97 %.    Vent Mode: PRVC FiO2 (%):  [40 %-50 %] 40 % Set Rate:  [30 bmp] 30 bmp Vt Set:  [500 mL] 500 mL PEEP:  [5 cmH20] 5 cmH20 Plateau Pressure:  [19 cmH20-31 cmH20] 19 cmH20   Intake/Output Summary (Last 24 hours) at 08/20/2020 0912 Last data filed at 08/20/2020 0800 Gross per 24 hour  Intake 960 ml  Output 3350 ml  Net -2390 ml   Filed Weights   08/13/20 2024 08/16/20 0549 08/17/20 0533  Weight: 61.6 kg 65.1 kg 61.2 kg   Physical Examination: General: Chronically ill-appearing middle-aged man in NAD. HEENT: [place]/AT, anicteric sclera, PERRL, moist mucous membranes. Trach in place. Neuro: Awake, oriented x 4. Responds to verbal stimuli. Following commands consistently. Moves all 4 extremities spontaneously.  CV: RRR, no m/g/r.  PULM: Breathing even and unlabored on trach collar. Lung fields CTAB in upper fields, diminished in bilateral lower lobes. GI: Soft, nontender, nondistended. Normoactive bowel sounds. Extremities: No LE edema noted. Skin: Warm/dry, no rashes.  Labs/imaging that I have personally reviewed: (right click and "Reselect all SmartList Selections" daily)  WBC 12.4 (12.8) H&H stable 10.5/34.9 Plt 232  Na 135, K 4.2, CO2 36, Cr 0.55 (0.58), Mg 2.2 Glucoses 76-189 last [facility]  Resolved Hospital Problem list     Assessment & Plan:  Acute on chronic hypoxic respiratory failure in setting of tracheostomy requirement from COVID-19 in 2021 with frequent admissions for hypoxia and increased tracheal secretions.  Positive for ARDS with questionable pneumonitis from Brenttuximab complicated by his  Hodgkin's lymphoma treated with current brent rituximab presents to [facility] 09/2020 increasing respiratory distress. Transferred to ICU for worsening respiratory failure, presumed hospital-acquired pneumonia. Vanc discontinued, as MRSA swab negative. - Continue Cefepime x 7-day course (end 5/14) - Continue Solumedrol Q12H - ATC as tolerated - Intermittent CXR as indicated  Hodgkin's lymphoma Anemia due to chronic illness s/p 2 units PRBC - Trend CBC - Transfuse for Hgb < 7.0  Generalized failure to thrive in the setting of ARDS, COVID-19, trach dependent and malnutrition. - Continue TFs as tolerated - Appreciate RD, therapy assistance  Gastroesophageal reflux disease - Continue PPI - May need repeat swallow eval - Consider SLP reconsult  Diabetes - Continue Lantus - Continue Novolog TF coverage + SSI  Severe Malnutrition related to chronic illness - TFs as above - Appreciate Nutrition consult/recs  Best practice (right click and "Reselect all SmartList Selections" daily)  Diet:  Tube Feed  Pain/Anxiety/Delirium protocol (if indicated): Yes (RASS goal 0) VAP protocol (if indicated): Yes DVT prophylaxis: LMWH GI prophylaxis: PPI Glucose control:  SSI Yes Central venous access:  N/A Arterial line:  N/A Foley:  N/A Mobility:  bed rest  PT consulted: N/A Last date of multidisciplinary goals of care discussion [ ]  Code Status:  full code Disposition: ICU  Critical care time: N/A   Rhae Lerner [facility] 08/20/20 10:50 AM  Please see [email or web address] for pager details.  From 7A-7P if no response, please call [phone] After hours, please call E-Link [phone]

## 2020-08-20 NOTE — Progress Notes (Signed)
RT NOTES: Pt placed on trach collar at this time. Also, suctioned for moderate thick yellow secretions and moderate sized mucous plug.

## 2020-08-21 DIAGNOSIS — J189 Pneumonia, unspecified organism: Secondary | ICD-10-CM | POA: Diagnosis not present

## 2020-08-21 DIAGNOSIS — A419 Sepsis, unspecified organism: Secondary | ICD-10-CM | POA: Diagnosis not present

## 2020-08-21 DIAGNOSIS — J9621 Acute and chronic respiratory failure with hypoxia: Secondary | ICD-10-CM | POA: Diagnosis not present

## 2020-08-21 DIAGNOSIS — D638 Anemia in other chronic diseases classified elsewhere: Secondary | ICD-10-CM | POA: Diagnosis not present

## 2020-08-21 LAB — CBC WITH DIFFERENTIAL/PLATELET
Abs Immature Granulocytes: 0.16 10*3/uL — ABNORMAL HIGH (ref 0.00–0.07)
Basophils Absolute: 0 10*3/uL (ref 0.0–0.1)
Basophils Relative: 0 %
Eosinophils Absolute: 0 10*3/uL (ref 0.0–0.5)
Eosinophils Relative: 0 %
HCT: 36.5 % — ABNORMAL LOW (ref 39.0–52.0)
Hemoglobin: 11.1 g/dL — ABNORMAL LOW (ref 13.0–17.0)
Immature Granulocytes: 1 %
Lymphocytes Relative: 13 %
Lymphs Abs: 1.5 10*3/uL (ref 0.7–4.0)
MCH: 28.1 pg (ref 26.0–34.0)
MCHC: 30.4 g/dL (ref 30.0–36.0)
MCV: 92.4 fL (ref 80.0–100.0)
Monocytes Absolute: 0.8 10*3/uL (ref 0.1–1.0)
Monocytes Relative: 7 %
Neutro Abs: 9.2 10*3/uL — ABNORMAL HIGH (ref 1.7–7.7)
Neutrophils Relative %: 79 %
Platelets: 231 10*3/uL (ref 150–400)
RBC: 3.95 MIL/uL — ABNORMAL LOW (ref 4.22–5.81)
RDW: 20.1 % — ABNORMAL HIGH (ref 11.5–15.5)
WBC: 11.7 10*3/uL — ABNORMAL HIGH (ref 4.0–10.5)
nRBC: 0.2 % (ref 0.0–0.2)

## 2020-08-21 LAB — GLUCOSE, CAPILLARY
Glucose-Capillary: 157 mg/dL — ABNORMAL HIGH (ref 70–99)
Glucose-Capillary: 104 mg/dL — ABNORMAL HIGH (ref 70–99)
Glucose-Capillary: 177 mg/dL — ABNORMAL HIGH (ref 70–99)
Glucose-Capillary: 157 mg/dL — ABNORMAL HIGH (ref 70–99)
Glucose-Capillary: 46 mg/dL — ABNORMAL LOW (ref 70–99)
Glucose-Capillary: 177 mg/dL — ABNORMAL HIGH (ref 70–99)

## 2020-08-21 LAB — BASIC METABOLIC PANEL
Anion gap: 3 — ABNORMAL LOW (ref 5–15)
BUN: 29 mg/dL — ABNORMAL HIGH (ref 8–23)
CO2: 35 mmol/L — ABNORMAL HIGH (ref 22–32)
Calcium: 8.6 mg/dL — ABNORMAL LOW (ref 8.9–10.3)
Chloride: 96 mmol/L — ABNORMAL LOW (ref 98–111)
Creatinine, Ser: 0.58 mg/dL — ABNORMAL LOW (ref 0.61–1.24)
GFR, Estimated: >60 mL/min (ref >60)
Glucose, Bld: 158 mg/dL — ABNORMAL HIGH (ref 70–99)
Potassium: 4.4 mmol/L (ref 3.5–5.1)
Sodium: 134 mmol/L — ABNORMAL LOW (ref 135–145)

## 2020-08-21 MED ORDER — DIPHENHYDRAMINE HCL 50 MG/ML IJ SOLN
25.0000 mg | Freq: Once | INTRAMUSCULAR | Status: AC
  Administered 2020-08-21: 25 mg via INTRAVENOUS

## 2020-08-21 MED ORDER — DEXTROSE 50 % IV SOLN: 25.0000 g | INTRAVENOUS | Status: AC

## 2020-08-21 MED ORDER — DEXTROSE 50 % IV SOLN
INTRAVENOUS | Status: AC
  Administered 2020-08-21: 25 g via INTRAVENOUS
  Filled 2020-08-21: qty 50

## 2020-08-21 NOTE — Progress Notes (Signed)
Hypoglycemic Event  CBG: 46  Treatment: D50 50 mL (25 gm)  Symptoms: Sweaty and Nervous/irritable  Follow-up CBG: Time: 2015 CBG Result: 157  Possible Reasons for Event: [place]

## 2020-08-21 NOTE — Progress Notes (Signed)
PROGRESS NOTE  Steve [name] PJA:[id number] DOB: [birth year] DOA: 08/13/2020 PCP: [name], PA-C   LOS: 8 days   Brief Narrative / Interim history: 65 year old male with severe episode of HALPF-79 in 0240 complicated by ARDS, pulmonary fibrosis, Hodgkin's lymphoma on chemotherapy, hypertension, now with chronic trach and hypoxic respiratory failure admitted to the hospital with worsening respiratory failure, increased secretions and increased oxygen requirements secondary to presumed pneumonia versus chemotherapy induced pneumonitis.  He was placed on broad-spectrum antibiotics as well as steroids.  He was initially admitted to the hospitalist service but then transferred to the ICU as his vent requirement increased  Subjective / 24h Interval events: Doing much better this morning, had an uneventful night.  He was on trach collar throughout the night  Assessment & Plan: Principal Problem Acute on chronic hypoxic respiratory failure in the setting of post-COVID ILD, hospital-acquired pneumonia versus chemotherapy induced pneumonitis (Brentuximab) -Patient was placed on broad-spectrum antibiotics with cefepime, continue for total of 7 days with 1 more day remaining -He was also placed on steroids, currently continue to taper -PCCM to continue to follow for his vent management.  Defer transferred out of the ICU to PCCM -At home normally he is on 8 L via trach, this morning requiring 10 L  Active Problems Generalized failure to thrive in the setting of ARDS, COVID-19, trach dependent, severe malnutrition -Appreciate nutrition input, continue feeding per PEG tube  Hodgkin's lymphoma -Dr. Marin Olp followed patient while hospitalized, may need to think about changing chemotherapy but this can be done as an outpatient  Anemia of chronic illness, underlying malignancy -Monitor CBC, no bleeding.  He was transfused a total of 2 units of packed red blood cells on 5/9 and hemoglobin has  remained stable since.  Type 2 diabetes mellitus -Continue Lantus, sliding scale.  CBGs slightly worse due to steroids  CBG (last 3)  Recent Labs    08/20/20 2320 08/21/20 0314 08/21/20 0812  GLUCAP 168* 157* 104*    Scheduled Meds: . arformoterol  15 mcg Nebulization BID  . budesonide (PULMICORT) nebulizer solution  0.25 mg Nebulization BID  . chlorhexidine gluconate (MEDLINE KIT)  15 mL Mouth Rinse BID  . Chlorhexidine Gluconate Cloth  6 each Topical Daily  . enoxaparin (LOVENOX) injection  40 mg Subcutaneous Q24H  . feeding supplement (PROSource TF)  45 mL Per Tube TID  . free water  180 mL Per Tube Q6H  . insulin aspart  0-15 Units Subcutaneous Q4H  . insulin aspart  4 Units Subcutaneous Q4H  . insulin glargine  15 Units Subcutaneous Daily  . magnesium oxide  400 mg Per Tube BID  . mouth rinse  15 mL Mouth Rinse 10 times per day  . methylPREDNISolone (SOLU-MEDROL) injection  40 mg Intravenous Q12H  . multivitamin  15 mL Per Tube Daily  . pantoprazole sodium  40 mg Per Tube Daily  . sertraline  50 mg Per Tube Daily  . sodium chloride flush  10-40 mL Intracatheter Q12H  . traZODone  100 mg Per Tube QHS   Continuous Infusions: . sodium chloride Stopped (08/17/20 1605)  . ceFEPime (MAXIPIME) IV Stopped (08/21/20 9735)  . feeding supplement (GLUCERNA 1.2 CAL) 1,000 mL (08/21/20 0113)   PRN Meds:.sodium chloride, albuterol, calamine, chlorhexidine, guaiFENesin, hyoscyamine, lidocaine-prilocaine, loperamide HCl, LORazepam, simethicone, sodium chloride flush, triamcinolone cream    DVT prophylaxis: enoxaparin (LOVENOX) injection 40 mg Start: 08/16/20 2000     Code Status: Full Code  Family Communication: No family at bedside  Status is: Inpatient  Remains inpatient appropriate because:Inpatient level of care appropriate due to severity of illness   Dispo: The patient is from: Home              Anticipated d/c is to: Home              Patient currently is not  medically stable to d/c.   Difficult to place patient No   Level of care: ICU  Consultants:  PCCM Oncology   Procedures:  none  Microbiology  Respiratory culture-gram-positive rods, gram-positive cocci Blood cultures 1/4 bottles-Staph warneri  Cultures-diphtheroids 5000 colonies  Antimicrobials: Cefepime    Objective: Vitals:   08/21/20 0700 08/21/20 0800 08/21/20 0828 08/21/20 0900  BP: 98/66 114/65 114/65 93/67  Pulse: 67 69 69 65  Resp: (!) 28 20 (!) 27 (!) 28  Temp:  98 F (36.7 C)    TempSrc:  Oral    SpO2: 98% 97% 99% 97%  Weight:      Height:        Intake/Output Summary (Last 24 hours) at 08/21/2020 0944 Last data filed at 08/21/2020 0900 Gross per 24 hour  Intake 1989.94 ml  Output 2250 ml  Net -260.06 ml   Filed Weights   08/16/20 0549 08/17/20 0533 08/21/20 0541  Weight: 65.1 kg 61.2 kg 52.6 kg    Examination:  Constitutional: NAD Eyes: no scleral icterus ENMT: Mucous membranes are moist.  Neck: normal, supple Respiratory: clear to auscultation bilaterally, no wheezing, no crackles. Normal respiratory effort. Cardiovascular: Regular rate and rhythm, no murmurs / rubs / gallops. No LE edema.  Abdomen: non distended, no tenderness. Bowel sounds positive.  Musculoskeletal: no clubbing / cyanosis.  Skin: no rashes Neurologic: CN 2-12 grossly intact. Strength 5/5 in all 4.  Psychiatric: Normal judgment and insight. Alert and oriented x 3. Normal mood.   Data Reviewed: I have independently reviewed following labs and imaging studies   CBC: Recent Labs  Lab 08/17/20 1500 08/18/20 0318 08/19/20 0400 08/20/20 0400 08/21/20 0302  WBC 12.7* 10.7* 12.8* 12.4* 11.7*  NEUTROABS  --   --   --   --  9.2*  HGB 9.9* 9.6* 10.0* 10.5* 11.1*  HCT 31.7* 31.3* 32.4* 34.9* 36.5*  MCV 88.5 89.4 90.5 90.9 92.4  PLT 172 169 198 232 801   Basic Metabolic Panel: Recent Labs  Lab 08/17/20 0326 08/18/20 0318 08/18/20 1954 08/19/20 0400 08/20/20 0400  08/21/20 0302  NA 140 138  --  136 135 134*  K 4.7 4.3  --  4.2 4.2 4.4  CL 96* 92*  --  91* 95* 96*  CO2 41* 41*  --  37* 36* 35*  GLUCOSE 357* 250*  --  155* 145* 158*  BUN 22 26*  --  28* 26* 29*  CREATININE 0.57* 0.55*  --  0.58* 0.55* 0.58*  CALCIUM 8.6* 8.6*  --  8.9 8.8* 8.6*  MG 1.9 2.2  --  2.2 2.2  --   PHOS  --  1.6* 2.9 3.0 3.2  --    Liver Function Tests: No results for input(s): AST, ALT, ALKPHOS, BILITOT, PROT, ALBUMIN in the last 168 hours. Coagulation Profile: No results for input(s): INR, PROTIME in the last 168 hours. HbA1C: No results for input(s): HGBA1C in the last 72 hours. CBG: Recent Labs  Lab 08/20/20 1512 08/20/20 1958 08/20/20 2320 08/21/20 0314 08/21/20 0812  GLUCAP 116* 89 168* 157* 104*    Recent Results (from the past 240 hour(s))  Blood Culture (routine x 2)     Status: None   Collection Time: 08/13/20 12:20 PM   Specimen: BLOOD LEFT HAND  Result Value Ref Range Status   Specimen Description BLOOD LEFT HAND  Final   Special Requests   Final    BOTTLES DRAWN AEROBIC AND ANAEROBIC Blood Culture results may not be optimal due to an inadequate volume of blood received in culture bottles   Culture   Final    NO GROWTH 5 DAYS Performed at [facility], [place] [address], [place], [place] [address]    Report Status 08/18/2020 FINAL  Final  Urine culture     Status: Abnormal   Collection Time: 08/13/20 12:20 PM   Specimen: In/Out Cath Urine  Result Value Ref Range Status   Specimen Description IN/OUT CATH URINE  Final   Special Requests NONE  Final   Culture (A)  Final    5,000 COLONIES/mL DIPHTHEROIDS(CORYNEBACTERIUM SPECIES) Standardized susceptibility testing for this organism is not available. Performed at [facility], [place] [address], [place],  [address]    Report Status 08/15/2020 FINAL  Final  Blood Culture (routine x 2)     Status: Abnormal   Collection Time: 08/13/20 12:25 PM   Specimen: BLOOD RIGHT FOREARM   Result Value Ref Range Status   Specimen Description BLOOD RIGHT FOREARM  Final   Special Requests   Final    BOTTLES DRAWN AEROBIC AND ANAEROBIC Blood Culture adequate volume   Culture  Setup Time   Final    GRAM POSITIVE COCCI AEROBIC BOTTLE ONLY CRITICAL RESULT CALLED TO, READ BACK BY AND VERIFIED WITH: PHARMD S KOEN [id number] [id number] FCP    Culture (A)  Final    STAPHYLOCOCCUS WARNERI THE SIGNIFICANCE OF ISOLATING THIS ORGANISM FROM A SINGLE SET OF BLOOD CULTURES WHEN MULTIPLE SETS ARE DRAWN IS UNCERTAIN. PLEASE NOTIFY THE MICROBIOLOGY DEPARTMENT WITHIN ONE WEEK IF SPECIATION AND SENSITIVITIES ARE REQUIRED. Performed at [facility], [place] [address], [place],  [address]    Report Status 08/15/2020 FINAL  Final  Blood Culture ID Panel (Reflexed)     Status: Abnormal   Collection Time: 08/13/20 12:25 PM  Result Value Ref Range Status   Enterococcus faecalis NOT DETECTED NOT DETECTED Final   Enterococcus Faecium NOT DETECTED NOT DETECTED Final   Listeria monocytogenes NOT DETECTED NOT DETECTED Final   Staphylococcus species DETECTED (A) NOT DETECTED Final    Comment: CRITICAL RESULT CALLED TO, READ BACK BY AND VERIFIED WITH: PHARMD S KOEN [phone] [id number] FCP    Staphylococcus aureus (BCID) NOT DETECTED NOT DETECTED Final   Staphylococcus epidermidis NOT DETECTED NOT DETECTED Final   Staphylococcus lugdunensis NOT DETECTED NOT DETECTED Final   Streptococcus species NOT DETECTED NOT DETECTED Final   Streptococcus agalactiae NOT DETECTED NOT DETECTED Final   Streptococcus pneumoniae NOT DETECTED NOT DETECTED Final   Streptococcus pyogenes NOT DETECTED NOT DETECTED Final   A.calcoaceticus-baumannii NOT DETECTED NOT DETECTED Final   Bacteroides fragilis NOT DETECTED NOT DETECTED Final   Enterobacterales NOT DETECTED NOT DETECTED Final   Enterobacter cloacae complex NOT DETECTED NOT DETECTED Final   Escherichia coli NOT DETECTED NOT DETECTED Final   Klebsiella aerogenes NOT  DETECTED NOT DETECTED Final   Klebsiella oxytoca NOT DETECTED NOT DETECTED Final   Klebsiella pneumoniae NOT DETECTED NOT DETECTED Final   Proteus species NOT DETECTED NOT DETECTED Final   Salmonella species NOT DETECTED NOT DETECTED Final   Serratia marcescens NOT DETECTED NOT  DETECTED Final   Haemophilus influenzae NOT DETECTED NOT DETECTED Final   Neisseria meningitidis NOT DETECTED NOT DETECTED Final   Pseudomonas aeruginosa NOT DETECTED NOT DETECTED Final   Stenotrophomonas maltophilia NOT DETECTED NOT DETECTED Final   Candida albicans NOT DETECTED NOT DETECTED Final   Candida auris NOT DETECTED NOT DETECTED Final   Candida glabrata NOT DETECTED NOT DETECTED Final   Candida krusei NOT DETECTED NOT DETECTED Final   Candida parapsilosis NOT DETECTED NOT DETECTED Final   Candida tropicalis NOT DETECTED NOT DETECTED Final   Cryptococcus neoformans/gattii NOT DETECTED NOT DETECTED Final    Comment: Performed at [facility], [place] [address] [place], [place] [address]  Expectorated Sputum Assessment w Gram Stain, Rflx to Resp Cult     Status: None   Collection Time: 08/13/20 12:31 PM   Specimen: Expectorated Sputum  Result Value Ref Range Status   Specimen Description EXPECTORATED SPUTUM  Final   Special Requests NONE  Final   Sputum evaluation   Final    THIS SPECIMEN IS ACCEPTABLE FOR SPUTUM CULTURE Performed at [facility], [place] [address], [place], [place] [address]    Report Status 08/13/2020 FINAL  Final  Culture, Respiratory w Gram Stain     Status: None   Collection Time: 08/13/20 12:31 PM  Result Value Ref Range Status   Specimen Description EXPECTORATED SPUTUM  Final   Special Requests NONE Reflexed from [id number]  Final   Gram Stain   Final    ABUNDANT WBC PRESENT, PREDOMINANTLY PMN MODERATE GRAM POSITIVE RODS RARE GRAM POSITIVE COCCI    Culture   Final    Normal respiratory flora-no Staph aureus or Pseudomonas seen Performed at [facility],  [place] [address], [place], [place] [address]    Report Status 08/16/2020 FINAL  Final  Resp Panel by RT-PCR (Flu A&B, Covid) Nasopharyngeal Swab     Status: None   Collection Time: 08/13/20 12:46 PM   Specimen: Nasopharyngeal Swab; Nasopharyngeal(NP) swabs in vial transport medium  Result Value Ref Range Status   SARS Coronavirus 2 by RT PCR NEGATIVE NEGATIVE Final    Comment: (NOTE) SARS-CoV-2 target nucleic acids are NOT DETECTED.  The SARS-CoV-2 RNA is generally detectable in upper respiratory specimens during the acute phase of infection. The lowest concentration of SARS-CoV-2 viral copies this assay can detect is 138 copies/mL. A negative result does not preclude SARS-Cov-2 infection and should not be used as the sole basis for treatment or other patient management decisions. A negative result may occur with  improper specimen collection/handling, submission of specimen other than nasopharyngeal swab, presence of viral mutation(s) within the areas targeted by this assay, and inadequate number of viral copies(<138 copies/mL). A negative result must be combined with clinical observations, patient history, and epidemiological information. The expected result is Negative.  Fact Sheet for Patients:  EntrepreneurPulse.com.au  Fact Sheet for Healthcare Providers:  IncredibleEmployment.be  This test is no t yet approved or cleared by the [place] FDA and  has been authorized for detection and/or diagnosis of SARS-CoV-2 by FDA under an Emergency Use Authorization (EUA). This EUA will remain  in effect (meaning this test can be used) for the duration of the COVID-19 declaration under Section 564(b)(1) of the Act, 21 U.S.C.section 360bbb-3(b)(1), unless the authorization is terminated  or revoked sooner.       Influenza A by PCR NEGATIVE NEGATIVE Final   Influenza B by PCR NEGATIVE NEGATIVE Final    Comment: (NOTE)  The Xpert Xpress  SARS-CoV-2/FLU/RSV plus assay is intended as an aid in the diagnosis of influenza from Nasopharyngeal swab specimens and should not be used as a sole basis for treatment. Nasal washings and aspirates are unacceptable for Xpert Xpress SARS-CoV-2/FLU/RSV testing.  Fact Sheet for Patients: EntrepreneurPulse.com.au  Fact Sheet for Healthcare Providers: IncredibleEmployment.be  This test is not yet approved or cleared by the [place] FDA and has been authorized for detection and/or diagnosis of SARS-CoV-2 by FDA under an Emergency Use Authorization (EUA). This EUA will remain in effect (meaning this test can be used) for the duration of the COVID-19 declaration under Section 564(b)(1) of the Act, 21 U.S.C. section 360bbb-3(b)(1), unless the authorization is terminated or revoked.  Performed at [facility], [place] [address], [place], [place] [address]   MRSA PCR Screening     Status: None   Collection Time: 08/13/20  6:17 PM   Specimen: Nasal Mucosa; Nasopharyngeal  Result Value Ref Range Status   MRSA by PCR NEGATIVE NEGATIVE Final    Comment:        The GeneXpert MRSA Assay (FDA approved for NASAL specimens only), is one component of a comprehensive MRSA colonization surveillance program. It is not intended to diagnose MRSA infection nor to guide or monitor treatment for MRSA infections. Performed at [facility], [place] [address] [place], [place] [address]   MRSA PCR Screening     Status: None   Collection Time: 08/16/20 11:35 AM   Specimen: Nasal Mucosa; Nasopharyngeal  Result Value Ref Range Status   MRSA by PCR NEGATIVE NEGATIVE Final    Comment:        The GeneXpert MRSA Assay (FDA approved for NASAL specimens only), is one component of a comprehensive MRSA colonization surveillance program. It is not intended to diagnose MRSA infection nor to guide or monitor treatment for MRSA infections. Performed at [facility], [place] [address] [place], [place] [address]      Radiology Studies: No results found.  Marzetta Board, MD, PhD [facility]  Between 7 am - 7 pm I am available, please contact me via Amion (for emergencies) or Securechat (non urgent messages)  Between 7 pm - 7 am I am not available, please contact night coverage MD/APP via Amion

## 2020-08-21 NOTE — Progress Notes (Signed)
Physical Therapy Treatment Patient Details Name: Steve [name] MRN: [id number] DOB: 1955/12/22 Today's Date: 08/21/2020    History of Present Illness 65 y.o. male presents to [facility] ED on 08/13/2020 with complaints of SOB. Pt is on 8L supplemental oxygen at baseline but reports sats in 70s at home. Pt admitted for suspected PNA and management of acute on chronic respiratory failure. Possible respiratory compromise due to chemotherapy drug.  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    PT Comments    Pt admitted with above diagnosis. Pt was able to pivot to chair with mod assist to power up and min assist for transfer.  Pt tolerated well and needed less assist than last visit.  Nurse updated as to best way to get pt back to bed.   Pt currently with functional limitations due to balance and endurance deficits. Pt will benefit from skilled PT to increase their independence and safety with mobility to allow discharge to the venue listed below.    Follow Up Recommendations  Home health PT;Supervision/Assistance - 24 hour     Equipment Recommendations  None recommended by PT    Recommendations for Other Services       Precautions / Restrictions Precautions Precaution Comments: 40% trach collar Restrictions Weight Bearing Restrictions: No    Mobility  Bed Mobility Overal bed mobility: Needs Assistance Bed Mobility: Supine to Sit;Sit to Supine     Supine to sit: +2 for safety/equipment;Min assist     General bed mobility comments: Assist to elevate trunk into sitting    Transfers Overall transfer level: Needs assistance Equipment used: 2 person hand held assist Transfers: Stand Pivot Transfers;Sit to/from Stand Sit to Stand: +2 physical assistance;From elevated surface;Mod assist Stand pivot transfers: Min assist;+2 safety/equipment       General transfer comment: Pt needed assist to power up and was able to  stand to full stand.  Pt keeps feet close together. Pt was able to take pivotal steps to the recliner with less posterior lean. Pt was instructed to reach for armrest and he was able to step around and then sit safely in chair with min assist for balance.  Ambulation/Gait             General Gait Details: Pt nonambulatory at baseline   Stairs             Wheelchair Mobility    Modified Rankin (Stroke Patients Only)       Balance Overall balance assessment: Needs assistance Sitting-balance support: Feet unsupported;Bilateral upper extremity supported Sitting balance-Leahy Scale: Poor Sitting balance - Comments: UE support   Standing balance support: Bilateral upper extremity supported;During functional activity Standing balance-Leahy Scale: Poor Standing balance comment: relies on external and UE support                            Cognition Arousal/Alertness: Awake/alert Behavior During Therapy: WFL for tasks assessed/performed Overall Cognitive Status: Difficult to assess                                 General Comments: appears intact. Follow commands. Answering questions      Exercises General Exercises - Lower Extremity Ankle Circles/Pumps: AROM;Both;10 reps;Seated Long Arc Quad: AROM;Both;10 reps;Seated    General Comments General comments (skin integrity, edema, etc.): VSS on 40% trach collar  Pertinent Vitals/Pain      Home Living                      Prior Function            PT Goals (current goals can now be found in the care plan section) Acute Rehab PT Goals Patient Stated Goal: not stated Progress towards PT goals: Progressing toward goals    Frequency    Min 3X/week      PT Plan Current plan remains appropriate    Co-evaluation              AM-PAC PT "6 Clicks" Mobility   Outcome Measure  Help needed turning from your back to your side while in a flat bed without using  bedrails?: A Lot Help needed moving from lying on your back to sitting on the side of a flat bed without using bedrails?: A Lot Help needed moving to and from a bed to a chair (including a wheelchair)?: A Lot Help needed standing up from a chair using your arms (e.g., wheelchair or bedside chair)?: A Lot Help needed to walk in hospital room?: Total Help needed climbing 3-5 steps with a railing? : Total 6 Click Score: 10    End of Session Equipment Utilized During Treatment: Gait belt;Oxygen (trach collar) Activity Tolerance: Patient tolerated treatment well Patient left: with call bell/phone within reach;in chair;with chair alarm set   PT Visit Diagnosis: Other abnormalities of gait and mobility (R26.89)     Time: 9179-1505 PT Time Calculation (min) (ACUTE ONLY): 15 min  Charges:  $Therapeutic Activity: 8-22 mins                     Ruth Kovich M,PT Acute Rehab Services [phone] (pager)   Alvira Philips 08/21/2020, 12:59 PM

## 2020-08-21 NOTE — Progress Notes (Signed)
eLink Physician-Brief Progress Note Patient Name: Steve [name] DOB: [birth year] MRN: [id number]   Date of Service  08/21/2020  HPI/Events of Note  Nursing request for AM labs.  eICU Interventions  Will order CBC with platelets and BMP at 5 AM.     Intervention Category Major Interventions: Other:  [name] 08/21/2020, 1:38 AM

## 2020-08-22 DIAGNOSIS — J9621 Acute and chronic respiratory failure with hypoxia: Secondary | ICD-10-CM | POA: Diagnosis not present

## 2020-08-22 DIAGNOSIS — J189 Pneumonia, unspecified organism: Secondary | ICD-10-CM | POA: Diagnosis not present

## 2020-08-22 DIAGNOSIS — A419 Sepsis, unspecified organism: Secondary | ICD-10-CM | POA: Diagnosis not present

## 2020-08-22 DIAGNOSIS — D638 Anemia in other chronic diseases classified elsewhere: Secondary | ICD-10-CM | POA: Diagnosis not present

## 2020-08-22 LAB — COMPREHENSIVE METABOLIC PANEL
ALT: 36 U/L (ref 0–44)
AST: 21 U/L (ref 15–41)
Albumin: 2.5 g/dL — ABNORMAL LOW (ref 3.5–5.0)
Alkaline Phosphatase: 51 U/L (ref 38–126)
Anion gap: 5 (ref 5–15)
BUN: 29 mg/dL — ABNORMAL HIGH (ref 8–23)
CO2: 35 mmol/L — ABNORMAL HIGH (ref 22–32)
Calcium: 8.7 mg/dL — ABNORMAL LOW (ref 8.9–10.3)
Chloride: 95 mmol/L — ABNORMAL LOW (ref 98–111)
Creatinine, Ser: 0.55 mg/dL — ABNORMAL LOW (ref 0.61–1.24)
GFR, Estimated: >60 mL/min (ref >60)
Glucose, Bld: 176 mg/dL — ABNORMAL HIGH (ref 70–99)
Potassium: 4.3 mmol/L (ref 3.5–5.1)
Sodium: 135 mmol/L (ref 135–145)
Total Bilirubin: 0.7 mg/dL (ref 0.3–1.2)
Total Protein: 5.7 g/dL — ABNORMAL LOW (ref 6.5–8.1)

## 2020-08-22 LAB — CBC
HCT: 37.7 % — ABNORMAL LOW (ref 39.0–52.0)
Hemoglobin: 11.2 g/dL — ABNORMAL LOW (ref 13.0–17.0)
MCH: 27.9 pg (ref 26.0–34.0)
MCHC: 29.7 g/dL — ABNORMAL LOW (ref 30.0–36.0)
MCV: 93.8 fL (ref 80.0–100.0)
Platelets: 217 10*3/uL (ref 150–400)
RBC: 4.02 MIL/uL — ABNORMAL LOW (ref 4.22–5.81)
RDW: 21 % — ABNORMAL HIGH (ref 11.5–15.5)
WBC: 10 10*3/uL (ref 4.0–10.5)
nRBC: 0.2 % (ref 0.0–0.2)

## 2020-08-22 LAB — GLUCOSE, CAPILLARY
Glucose-Capillary: 171 mg/dL — ABNORMAL HIGH (ref 70–99)
Glucose-Capillary: 87 mg/dL (ref 70–99)
Glucose-Capillary: 182 mg/dL — ABNORMAL HIGH (ref 70–99)
Glucose-Capillary: 77 mg/dL (ref 70–99)
Glucose-Capillary: 138 mg/dL — ABNORMAL HIGH (ref 70–99)

## 2020-08-22 MED ORDER — INSULIN GLARGINE 100 UNIT/ML ~~LOC~~ SOLN
8.0000 [IU] | Freq: Every day | SUBCUTANEOUS | Status: DC
  Administered 2020-08-23 – 2020-08-24 (×3): 8 [IU] via SUBCUTANEOUS
  Filled 2020-08-22 (×5): qty 0.08

## 2020-08-22 MED ORDER — INSULIN ASPART 100 UNIT/ML IJ SOLN
2.0000 [IU] | INTRAMUSCULAR | Status: DC
  Administered 2020-08-22 (×2): 2 [IU] via SUBCUTANEOUS

## 2020-08-22 MED ORDER — ALBUMIN HUMAN 25 % IV SOLN
12.5000 g | Freq: Once | INTRAVENOUS | Status: AC
  Administered 2020-08-22: 12.5 g via INTRAVENOUS
  Filled 2020-08-22: qty 50

## 2020-08-22 NOTE — Progress Notes (Addendum)
Notified E-Link about Patients soft blood pressures. Systolic's in the 91'P-91'T MAP's in the 60's. Patient resting, and oriented when aroused. Awaiting orders, and will continue to monitor closely.

## 2020-08-22 NOTE — Progress Notes (Signed)
eLink Physician-Brief Progress Note Patient Name: SUHAYB ANZALONE DOB: Dec 01, 1955 MRN: [id number]   Date of Service  08/22/2020  HPI/Events of Note  Episode of hypotension with SBP in 70's. Albumin = 2.5. BP now = 97/66 with MAP = 78.  eICU Interventions  Plan: 1. 25% Albumin 12.5 gm IV now.      Intervention Category Major Interventions: Hypotension - evaluation and management  Edee Nifong [place] 08/22/2020, 1:01 AM

## 2020-08-22 NOTE — Progress Notes (Signed)
PROGRESS NOTE  Steve [name] MVE:[id number] DOB: 24-Mar-1956 DOA: 08/13/2020 PCP: [name], PA-C   LOS: 9 days   Brief Narrative / Interim history: 65 year old male with severe episode of [id number] in [id number] complicated by ARDS, pulmonary fibrosis, Hodgkin's lymphoma on chemotherapy, hypertension, now with chronic trach and hypoxic respiratory failure admitted to the hospital with worsening respiratory failure, increased secretions and increased oxygen requirements secondary to presumed pneumonia versus chemotherapy induced pneumonitis.  He was placed on broad-spectrum antibiotics as well as steroids.  He was initially admitted to the hospitalist service but then transferred to the ICU as his vent requirement increased  Subjective / 24h Interval events: No complaints, no lightheadedness or dizziness.  Borderline hypotensive overnight with asymptomatic  Assessment & Plan: Principal Problem Acute on chronic hypoxic respiratory failure in the setting of post-COVID ILD, hospital-acquired pneumonia versus chemotherapy induced pneumonitis (Brentuximab) -Patient was placed on broad-spectrum antibiotics with cefepime, completing 7 days today -He was also placed on steroids, currently continue to taper -PCCM to continue to follow for his vent management.  Has been on trach collar for the past 48 hours, transferred to progressive -At home normally he is on 8 L via trach, this morning returned to baseline 8 L of oxygen  Active Problems Generalized failure to thrive in the setting of ARDS, COVID-19, trach dependent, severe malnutrition -Appreciate nutrition input, continue feeding per PEG tube  Hodgkin's lymphoma -Dr. Marin Olp followed patient while hospitalized, may need to think about changing chemotherapy but this can be done as an outpatient  Anemia of chronic illness, underlying malignancy -Monitor CBC, no bleeding.  He was transfused a total of 2 units of packed red blood cells on 5/9 and  hemoglobin has remained stable since.  Type 2 diabetes mellitus -Continue Lantus, sliding scale.  Had an episode of hypoglycemia yesterday, decrease Lantus as well as NovoLog  CBG (last 3)  Recent Labs    08/22/20 0319 08/22/20 0721 08/22/20 1106  GLUCAP 171* 87 182*    Scheduled Meds: . arformoterol  15 mcg Nebulization BID  . budesonide (PULMICORT) nebulizer solution  0.25 mg Nebulization BID  . chlorhexidine gluconate (MEDLINE KIT)  15 mL Mouth Rinse BID  . Chlorhexidine Gluconate Cloth  6 each Topical Daily  . enoxaparin (LOVENOX) injection  40 mg Subcutaneous Q24H  . feeding supplement (PROSource TF)  45 mL Per Tube TID  . free water  180 mL Per Tube Q6H  . insulin aspart  0-15 Units Subcutaneous Q4H  . insulin aspart  2 Units Subcutaneous Q4H  . insulin glargine  8 Units Subcutaneous QHS  . magnesium oxide  400 mg Per Tube BID  . mouth rinse  15 mL Mouth Rinse 10 times per day  . methylPREDNISolone (SOLU-MEDROL) injection  40 mg Intravenous Q12H  . multivitamin  15 mL Per Tube Daily  . pantoprazole sodium  40 mg Per Tube Daily  . sertraline  50 mg Per Tube Daily  . sodium chloride flush  10-40 mL Intracatheter Q12H  . traZODone  100 mg Per Tube QHS   Continuous Infusions: . sodium chloride 10 mL/hr at 08/22/20 0800  . ceFEPime (MAXIPIME) IV Stopped (08/22/20 0600)  . feeding supplement (GLUCERNA 1.2 CAL) 1,000 mL (08/21/20 0113)   PRN Meds:.sodium chloride, albuterol, calamine, chlorhexidine, guaiFENesin, hyoscyamine, lidocaine-prilocaine, loperamide HCl, LORazepam, simethicone, sodium chloride flush, triamcinolone cream    DVT prophylaxis: enoxaparin (LOVENOX) injection 40 mg Start: 08/16/20 2000     Code Status: Full Code  Family Communication: No  family at bedside  Status is: Inpatient  Remains inpatient appropriate because:Inpatient level of care appropriate due to severity of illness   Dispo: The patient is from: Home              Anticipated d/c is  to: Home              Patient currently is not medically stable to d/c.   Difficult to place patient No   Level of care: Progressive  Consultants:  PCCM Oncology   Procedures:  none  Microbiology  Respiratory culture-gram-positive rods, gram-positive cocci Blood cultures 1/4 bottles-Staph warneri  Cultures-diphtheroids 5000 colonies  Antimicrobials: Cefepime    Objective: Vitals:   08/22/20 0900 08/22/20 1000 08/22/20 1100 08/22/20 1115  BP: 93/60 91/62 95/61    Pulse: 77 75 83   Resp: (!) 31 (!) 25 (!) 23   Temp:    98.1 F (36.7 C)  TempSrc:    Oral  SpO2: 91% 92% (!) 89%   Weight:      Height:        Intake/Output Summary (Last 24 hours) at 08/22/2020 1141 Last data filed at 08/22/2020 0800 Gross per 24 hour  Intake 2291.69 ml  Output 1775 ml  Net 516.69 ml   Filed Weights   08/16/20 0549 08/17/20 0533 08/21/20 0541  Weight: 65.1 kg 61.2 kg 52.6 kg    Examination:  Constitutional: He is in no distress, in bed, appears comfortable Eyes: No scleral icterus noted ENMT: mmm Neck: normal, supple Respiratory: Lungs are clear bilaterally without wheezing or crackles. Cardiovascular: Heart is regular, no murmurs, no peripheral edema Abdomen: Abdomen is nondistended, bowel sounds positive.  PEG tube in place Musculoskeletal: no clubbing / cyanosis.  Skin: No rashes seen Neurologic: Nonfocal exam  Data Reviewed: I have independently reviewed following labs and imaging studies   CBC: Recent Labs  Lab 08/18/20 0318 08/19/20 0400 08/20/20 0400 08/21/20 0302 08/22/20 0345  WBC 10.7* 12.8* 12.4* 11.7* 10.0  NEUTROABS  --   --   --  9.2*  --   HGB 9.6* 10.0* 10.5* 11.1* 11.2*  HCT 31.3* 32.4* 34.9* 36.5* 37.7*  MCV 89.4 90.5 90.9 92.4 93.8  PLT 169 198 232 231 606   Basic Metabolic Panel: Recent Labs  Lab 08/17/20 0326 08/18/20 0318 08/18/20 1954 08/19/20 0400 08/20/20 0400 08/21/20 0302 08/22/20 0345  NA 140 138  --  136 135 134* 135  K 4.7  4.3  --  4.2 4.2 4.4 4.3  CL 96* 92*  --  91* 95* 96* 95*  CO2 41* 41*  --  37* 36* 35* 35*  GLUCOSE 357* 250*  --  155* 145* 158* 176*  BUN 22 26*  --  28* 26* 29* 29*  CREATININE 0.57* 0.55*  --  0.58* 0.55* 0.58* 0.55*  CALCIUM 8.6* 8.6*  --  8.9 8.8* 8.6* 8.7*  MG 1.9 2.2  --  2.2 2.2  --   --   PHOS  --  1.6* 2.9 3.0 3.2  --   --    Liver Function Tests: Recent Labs  Lab 08/22/20 0345  AST 21  ALT 36  ALKPHOS 51  BILITOT 0.7  PROT 5.7*  ALBUMIN 2.5*   Coagulation Profile: No results for input(s): INR, PROTIME in the last 168 hours. HbA1C: No results for input(s): HGBA1C in the last 72 hours. CBG: Recent Labs  Lab 08/21/20 2015 08/21/20 2325 08/22/20 0319 08/22/20 0721 08/22/20 1106  GLUCAP 157* 177* 171* 87  182*    Recent Results (from the past 240 hour(s))  Blood Culture (routine x 2)     Status: None   Collection Time: 08/13/20 12:20 PM   Specimen: BLOOD LEFT HAND  Result Value Ref Range Status   Specimen Description BLOOD LEFT HAND  Final   Special Requests   Final    BOTTLES DRAWN AEROBIC AND ANAEROBIC Blood Culture results may not be optimal due to an inadequate volume of blood received in culture bottles   Culture   Final    NO GROWTH 5 DAYS Performed at [facility], [place] [address] [place], [place] [address]    Report Status 08/18/2020 FINAL  Final  Urine culture     Status: Abnormal   Collection Time: 08/13/20 12:20 PM   Specimen: In/Out Cath Urine  Result Value Ref Range Status   Specimen Description IN/OUT CATH URINE  Final   Special Requests NONE  Final   Culture (A)  Final    5,000 COLONIES/mL DIPHTHEROIDS(CORYNEBACTERIUM SPECIES) Standardized susceptibility testing for this organism is not available. Performed at [facility], [place] [address] [place], [place] [address]    Report Status 08/15/2020 FINAL  Final  Blood Culture (routine x 2)     Status: Abnormal   Collection Time: 08/13/20 12:25 PM   Specimen: BLOOD RIGHT  FOREARM  Result Value Ref Range Status   Specimen Description BLOOD RIGHT FOREARM  Final   Special Requests   Final    BOTTLES DRAWN AEROBIC AND ANAEROBIC Blood Culture adequate volume   Culture  Setup Time   Final    GRAM POSITIVE COCCI AEROBIC BOTTLE ONLY CRITICAL RESULT CALLED TO, READ BACK BY AND VERIFIED WITH: PHARMD S KOEN [id number] FCP    Culture (A)  Final    STAPHYLOCOCCUS WARNERI THE SIGNIFICANCE OF ISOLATING THIS ORGANISM FROM A SINGLE SET OF BLOOD CULTURES WHEN MULTIPLE SETS ARE DRAWN IS UNCERTAIN. PLEASE NOTIFY THE MICROBIOLOGY DEPARTMENT WITHIN ONE WEEK IF SPECIATION AND SENSITIVITIES ARE REQUIRED. Performed at [facility], [place] [address] [place], [place] [address]    Report Status 08/15/2020 FINAL  Final  Blood Culture ID Panel (Reflexed)     Status: Abnormal   Collection Time: 08/13/20 12:25 PM  Result Value Ref Range Status   Enterococcus faecalis NOT DETECTED NOT DETECTED Final   Enterococcus Faecium NOT DETECTED NOT DETECTED Final   Listeria monocytogenes NOT DETECTED NOT DETECTED Final   Staphylococcus species DETECTED (A) NOT DETECTED Final    Comment: CRITICAL RESULT CALLED TO, READ BACK BY AND VERIFIED WITH: PHARMD S KOEN [id number] FCP    Staphylococcus aureus (BCID) NOT DETECTED NOT DETECTED Final   Staphylococcus epidermidis NOT DETECTED NOT DETECTED Final   Staphylococcus lugdunensis NOT DETECTED NOT DETECTED Final   Streptococcus species NOT DETECTED NOT DETECTED Final   Streptococcus agalactiae NOT DETECTED NOT DETECTED Final   Streptococcus pneumoniae NOT DETECTED NOT DETECTED Final   Streptococcus pyogenes NOT DETECTED NOT DETECTED Final   A.calcoaceticus-baumannii NOT DETECTED NOT DETECTED Final   Bacteroides fragilis NOT DETECTED NOT DETECTED Final   Enterobacterales NOT DETECTED NOT DETECTED Final   Enterobacter cloacae complex NOT DETECTED NOT DETECTED Final   Escherichia coli NOT DETECTED NOT DETECTED Final   Klebsiella aerogenes  NOT DETECTED NOT DETECTED Final   Klebsiella oxytoca NOT DETECTED NOT DETECTED Final   Klebsiella pneumoniae NOT DETECTED NOT DETECTED Final   Proteus species NOT DETECTED NOT DETECTED Final   Salmonella species  NOT DETECTED NOT DETECTED Final   Serratia marcescens NOT DETECTED NOT DETECTED Final   Haemophilus influenzae NOT DETECTED NOT DETECTED Final   Neisseria meningitidis NOT DETECTED NOT DETECTED Final   Pseudomonas aeruginosa NOT DETECTED NOT DETECTED Final   Stenotrophomonas maltophilia NOT DETECTED NOT DETECTED Final   Candida albicans NOT DETECTED NOT DETECTED Final   Candida auris NOT DETECTED NOT DETECTED Final   Candida glabrata NOT DETECTED NOT DETECTED Final   Candida krusei NOT DETECTED NOT DETECTED Final   Candida parapsilosis NOT DETECTED NOT DETECTED Final   Candida tropicalis NOT DETECTED NOT DETECTED Final   Cryptococcus neoformans/gattii NOT DETECTED NOT DETECTED Final    Comment: Performed at [facility], [place] [address], [place], [place] [address]  Expectorated Sputum Assessment w Gram Stain, Rflx to Resp Cult     Status: None   Collection Time: 08/13/20 12:31 PM   Specimen: Expectorated Sputum  Result Value Ref Range Status   Specimen Description EXPECTORATED SPUTUM  Final   Special Requests NONE  Final   Sputum evaluation   Final    THIS SPECIMEN IS ACCEPTABLE FOR SPUTUM CULTURE Performed at [facility], [place] [address], [place], [place] [address]    Report Status 08/13/2020 FINAL  Final  Culture, Respiratory w Gram Stain     Status: None   Collection Time: 08/13/20 12:31 PM  Result Value Ref Range Status   Specimen Description EXPECTORATED SPUTUM  Final   Special Requests NONE Reflexed from [id number]  Final   Gram Stain   Final    ABUNDANT WBC PRESENT, PREDOMINANTLY PMN MODERATE GRAM POSITIVE RODS RARE GRAM POSITIVE COCCI    Culture   Final    Normal respiratory flora-no Staph aureus or Pseudomonas seen Performed at [facility],  [place] [address], [place], [place] [address]    Report Status 08/16/2020 FINAL  Final  Resp Panel by RT-PCR (Flu A&B, Covid) Nasopharyngeal Swab     Status: None   Collection Time: 08/13/20 12:46 PM   Specimen: Nasopharyngeal Swab; Nasopharyngeal(NP) swabs in vial transport medium  Result Value Ref Range Status   SARS Coronavirus 2 by RT PCR NEGATIVE NEGATIVE Final    Comment: (NOTE) SARS-CoV-2 target nucleic acids are NOT DETECTED.  The SARS-CoV-2 RNA is generally detectable in upper respiratory specimens during the acute phase of infection. The lowest concentration of SARS-CoV-2 viral copies this assay can detect is 138 copies/mL. A negative result does not preclude SARS-Cov-2 infection and should not be used as the sole basis for treatment or other patient management decisions. A negative result may occur with  improper specimen collection/handling, submission of specimen other than nasopharyngeal swab, presence of viral mutation(s) within the areas targeted by this assay, and inadequate number of viral copies(<138 copies/mL). A negative result must be combined with clinical observations, patient history, and epidemiological information. The expected result is Negative.  Fact Sheet for Patients:  EntrepreneurPulse.com.au  Fact Sheet for Healthcare Providers:  IncredibleEmployment.be  This test is no t yet approved or cleared by the [place] FDA and  has been authorized for detection and/or diagnosis of SARS-CoV-2 by FDA under an Emergency Use Authorization (EUA). This EUA will remain  in effect (meaning this test can be used) for the duration of the COVID-19 declaration under Section 564(b)(1) of the Act, 21 U.S.C.section 360bbb-3(b)(1), unless the authorization is terminated  or revoked sooner.       Influenza A by PCR NEGATIVE NEGATIVE Final  Influenza B by PCR NEGATIVE NEGATIVE Final    Comment: (NOTE) The Xpert Xpress  SARS-CoV-2/FLU/RSV plus assay is intended as an aid in the diagnosis of influenza from Nasopharyngeal swab specimens and should not be used as a sole basis for treatment. Nasal washings and aspirates are unacceptable for Xpert Xpress SARS-CoV-2/FLU/RSV testing.  Fact Sheet for Patients: EntrepreneurPulse.com.au  Fact Sheet for Healthcare Providers: IncredibleEmployment.be  This test is not yet approved or cleared by the [place] FDA and has been authorized for detection and/or diagnosis of SARS-CoV-2 by FDA under an Emergency Use Authorization (EUA). This EUA will remain in effect (meaning this test can be used) for the duration of the COVID-19 declaration under Section 564(b)(1) of the Act, 21 U.S.C. section 360bbb-3(b)(1), unless the authorization is terminated or revoked.  Performed at [facility], [place] [address], [place], [place] [address]   MRSA PCR Screening     Status: None   Collection Time: 08/13/20  6:17 PM   Specimen: Nasal Mucosa; Nasopharyngeal  Result Value Ref Range Status   MRSA by PCR NEGATIVE NEGATIVE Final    Comment:        The GeneXpert MRSA Assay (FDA approved for NASAL specimens only), is one component of a comprehensive MRSA colonization surveillance program. It is not intended to diagnose MRSA infection nor to guide or monitor treatment for MRSA infections. Performed at [facility], [place] [address] [place], [place] [address]   MRSA PCR Screening     Status: None   Collection Time: 08/16/20 11:35 AM   Specimen: Nasal Mucosa; Nasopharyngeal  Result Value Ref Range Status   MRSA by PCR NEGATIVE NEGATIVE Final    Comment:        The GeneXpert MRSA Assay (FDA approved for NASAL specimens only), is one component of a comprehensive MRSA colonization surveillance program. It is not intended to diagnose MRSA infection nor to guide or monitor treatment for MRSA infections. Performed at [facility], [place] [address] [place], [place] [address]      Radiology Studies: No results found.  Marzetta Board, MD, PhD [facility]  Between 7 am - 7 pm I am available, please contact me via Amion (for emergencies) or Securechat (non urgent messages)  Between 7 pm - 7 am I am not available, please contact night coverage MD/APP via Amion

## 2020-08-23 DIAGNOSIS — A419 Sepsis, unspecified organism: Secondary | ICD-10-CM | POA: Diagnosis not present

## 2020-08-23 DIAGNOSIS — J189 Pneumonia, unspecified organism: Secondary | ICD-10-CM | POA: Diagnosis not present

## 2020-08-23 DIAGNOSIS — J9621 Acute and chronic respiratory failure with hypoxia: Secondary | ICD-10-CM | POA: Diagnosis not present

## 2020-08-23 DIAGNOSIS — D638 Anemia in other chronic diseases classified elsewhere: Secondary | ICD-10-CM | POA: Diagnosis not present

## 2020-08-23 LAB — GLUCOSE, CAPILLARY
Glucose-Capillary: 106 mg/dL — ABNORMAL HIGH (ref 70–99)
Glucose-Capillary: 128 mg/dL — ABNORMAL HIGH (ref 70–99)
Glucose-Capillary: 139 mg/dL — ABNORMAL HIGH (ref 70–99)
Glucose-Capillary: 147 mg/dL — ABNORMAL HIGH (ref 70–99)
Glucose-Capillary: 167 mg/dL — ABNORMAL HIGH (ref 70–99)
Glucose-Capillary: 197 mg/dL — ABNORMAL HIGH (ref 70–99)

## 2020-08-23 MED ORDER — MIDODRINE HCL 5 MG PO TABS
5.0000 mg | ORAL_TABLET | Freq: Three times a day (TID) | ORAL | Status: DC
  Administered 2020-08-23 – 2020-08-25 (×7): 5 mg via ORAL
  Filled 2020-08-23 (×7): qty 1

## 2020-08-23 NOTE — Progress Notes (Signed)
PROGRESS NOTE  Steve [name] RWE:[id number] DOB: [birth year] DOA: 08/13/2020 PCP: [name], PA-C   LOS: 10 days   Brief Narrative / Interim history: 65 year old male with severe episode of YPPJK-93 in 2671 complicated by ARDS, pulmonary fibrosis, Hodgkin's lymphoma on chemotherapy, hypertension, now with chronic trach and hypoxic respiratory failure admitted to the hospital with worsening respiratory failure, increased secretions and increased oxygen requirements secondary to presumed pneumonia versus chemotherapy induced pneumonitis.  He was placed on broad-spectrum antibiotics as well as steroids.  He was initially admitted to the hospitalist service but then transferred to the ICU as his vent requirement increased  Subjective / 24h Interval events: No complaints this morning.  Denies any shortness of breath more so than his baseline, no chest pain, no nausea or vomiting.  Assessment & Plan: Principal Problem Acute on chronic hypoxic respiratory failure in the setting of post-COVID ILD, hospital-acquired pneumonia versus chemotherapy induced pneumonitis (Brentuximab) -patient was initially admitted to the ICU given high oxygen requirements, he was placed on broad-spectrum antibiotics and completed 7 days of cefepime.  He was also placed on high-dose of steroids given concern for chemotherapy induced pneumonitis.  He was initiated on event but then eventually weaned back off to trach collar, has been stable off vent now for several days and is back on his home 8 L.  Active Problems Generalized failure to thrive in the setting of ARDS, COVID-19, trach dependent, severe malnutrition -Appreciate nutrition input, continue feeding per PEG tube.  Patient has lost 20 pounds in the last month Hypotension-patient's blood pressure in the outpatient setting was 245-809 systolic, he appears to be in the 90s now, he is asymptomatic with this.  Suspect related to his progressive failure to thrive/weight  loss may be more hypotensive now.  I will start low-dose midodrine Hodgkin's lymphoma -Dr. Marin Olp followed patient while hospitalized, may need to think about changing chemotherapy but this can be done as an outpatient Anemia of chronic illness, underlying malignancy -Monitor CBC, no bleeding.  He was transfused a total of 2 units of packed red blood cells on 5/9 and hemoglobin has remained stable since. Type 2 diabetes mellitus -Continue Lantus, sliding scale.  Had an episode of hypoglycemia yesterday, decrease Lantus as well as NovoLog  CBG (last 3)  Recent Labs    08/23/20 0027 08/23/20 0452 08/23/20 0747  GLUCAP 167* 128* 106*    Scheduled Meds: . arformoterol  15 mcg Nebulization BID  . budesonide (PULMICORT) nebulizer solution  0.25 mg Nebulization BID  . chlorhexidine gluconate (MEDLINE KIT)  15 mL Mouth Rinse BID  . Chlorhexidine Gluconate Cloth  6 each Topical Daily  . enoxaparin (LOVENOX) injection  40 mg Subcutaneous Q24H  . feeding supplement (PROSource TF)  45 mL Per Tube TID  . free water  180 mL Per Tube Q6H  . insulin aspart  0-15 Units Subcutaneous Q4H  . insulin glargine  8 Units Subcutaneous QHS  . magnesium oxide  400 mg Per Tube BID  . mouth rinse  15 mL Mouth Rinse 10 times per day  . methylPREDNISolone (SOLU-MEDROL) injection  40 mg Intravenous Q12H  . midodrine  5 mg Oral TID WC  . multivitamin  15 mL Per Tube Daily  . pantoprazole sodium  40 mg Per Tube Daily  . sertraline  50 mg Per Tube Daily  . sodium chloride flush  10-40 mL Intracatheter Q12H  . traZODone  100 mg Per Tube QHS   Continuous Infusions: . sodium chloride 250 mL (  08/22/20 2341)  . feeding supplement (GLUCERNA 1.2 CAL) 1,000 mL (08/21/20 0113)   PRN Meds:.sodium chloride, albuterol, calamine, chlorhexidine, guaiFENesin, hyoscyamine, lidocaine-prilocaine, loperamide HCl, LORazepam, simethicone, sodium chloride flush, triamcinolone cream   DVT prophylaxis: enoxaparin (LOVENOX) injection  40 mg Start: 08/16/20 2000    Code Status: Full Code  Family Communication: No family at bedside  Status is: Inpatient  Remains inpatient appropriate because:Inpatient level of care appropriate due to severity of illness   Dispo: The patient is from: Home              Anticipated d/c is to: Home              Patient currently is not medically stable to d/c.   Difficult to place patient No   Level of care: Progressive  Consultants:  PCCM Oncology   Procedures:  none  Microbiology  Respiratory culture-gram-positive rods, gram-positive cocci Blood cultures 1/4 bottles-Staph warneri  Cultures-diphtheroids 5000 colonies  Antimicrobials: Cefepime    Objective: Vitals:   08/23/20 0700 08/23/20 0747 08/23/20 0844 08/23/20 0848  BP: (!) 90/58     Pulse: 67     Resp:   18   Temp: 97.7 F (36.5 C)     TempSrc: Oral     SpO2: 96% 96% 97% 96%  Weight:      Height:        Intake/Output Summary (Last 24 hours) at 08/23/2020 0936 Last data filed at 08/23/2020 0828 Gross per 24 hour  Intake 1087.92 ml  Output 2300 ml  Net -1212.08 ml   Filed Weights   08/16/20 0549 08/17/20 0533 08/21/20 0541  Weight: 65.1 kg 61.2 kg 52.6 kg    Examination:  Constitutional: No distress, in bed, comfortable Eyes: No scleral icterus ENMT: mmm Neck: normal, supple Respiratory: Clear bilaterally, no wheezing, no crackles Cardiovascular: Regular rate and rhythm, no murmurs, no edema Abdomen: Soft, NT, ND, bowel sounds positive.  PEG tube in place Musculoskeletal: no clubbing / cyanosis.  Skin: No rashes seen Neurologic: Nonfocal  Data Reviewed: I have independently reviewed following labs and imaging studies   CBC: Recent Labs  Lab 08/18/20 0318 08/19/20 0400 08/20/20 0400 08/21/20 0302 08/22/20 0345  WBC 10.7* 12.8* 12.4* 11.7* 10.0  NEUTROABS  --   --   --  9.2*  --   HGB 9.6* 10.0* 10.5* 11.1* 11.2*  HCT 31.3* 32.4* 34.9* 36.5* 37.7*  MCV 89.4 90.5 90.9 92.4 93.8   PLT 169 198 232 231 250   Basic Metabolic Panel: Recent Labs  Lab 08/17/20 0326 08/18/20 0318 08/18/20 1954 08/19/20 0400 08/20/20 0400 08/21/20 0302 08/22/20 0345  NA 140 138  --  136 135 134* 135  K 4.7 4.3  --  4.2 4.2 4.4 4.3  CL 96* 92*  --  91* 95* 96* 95*  CO2 41* 41*  --  37* 36* 35* 35*  GLUCOSE 357* 250*  --  155* 145* 158* 176*  BUN 22 26*  --  28* 26* 29* 29*  CREATININE 0.57* 0.55*  --  0.58* 0.55* 0.58* 0.55*  CALCIUM 8.6* 8.6*  --  8.9 8.8* 8.6* 8.7*  MG 1.9 2.2  --  2.2 2.2  --   --   PHOS  --  1.6* 2.9 3.0 3.2  --   --    Liver Function Tests: Recent Labs  Lab 08/22/20 0345  AST 21  ALT 36  ALKPHOS 51  BILITOT 0.7  PROT 5.7*  ALBUMIN 2.5*  Coagulation Profile: No results for input(s): INR, PROTIME in the last 168 hours. HbA1C: No results for input(s): HGBA1C in the last 72 hours. CBG: Recent Labs  Lab 08/22/20 1524 08/22/20 2005 08/23/20 0027 08/23/20 0452 08/23/20 0747  GLUCAP 77 138* 167* 128* 106*    Recent Results (from the past 240 hour(s))  Blood Culture (routine x 2)     Status: None   Collection Time: 08/13/20 12:20 PM   Specimen: BLOOD LEFT HAND  Result Value Ref Range Status   Specimen Description BLOOD LEFT HAND  Final   Special Requests   Final    BOTTLES DRAWN AEROBIC AND ANAEROBIC Blood Culture results may not be optimal due to an inadequate volume of blood received in culture bottles   Culture   Final    NO GROWTH 5 DAYS Performed at [facility], [place] [address] [place], [place] [address]    Report Status 08/18/2020 FINAL  Final  Urine culture     Status: Abnormal   Collection Time: 08/13/20 12:20 PM   Specimen: In/Out Cath Urine  Result Value Ref Range Status   Specimen Description IN/OUT CATH URINE  Final   Special Requests NONE  Final   Culture (A)  Final    5,000 COLONIES/mL DIPHTHEROIDS(CORYNEBACTERIUM SPECIES) Standardized susceptibility testing for this organism is not available. Performed at [facility], [place] [address] [place], [place] [address]    Report Status 08/15/2020 FINAL  Final  Blood Culture (routine x 2)     Status: Abnormal   Collection Time: 08/13/20 12:25 PM   Specimen: BLOOD RIGHT FOREARM  Result Value Ref Range Status   Specimen Description BLOOD RIGHT FOREARM  Final   Special Requests   Final    BOTTLES DRAWN AEROBIC AND ANAEROBIC Blood Culture adequate volume   Culture  Setup Time   Final    GRAM POSITIVE COCCI AEROBIC BOTTLE ONLY CRITICAL RESULT CALLED TO, READ BACK BY AND VERIFIED WITH: PHARMD S KOEN [id number] [id number] FCP    Culture (A)  Final    STAPHYLOCOCCUS WARNERI THE SIGNIFICANCE OF ISOLATING THIS ORGANISM FROM A SINGLE SET OF BLOOD CULTURES WHEN MULTIPLE SETS ARE DRAWN IS UNCERTAIN. PLEASE NOTIFY THE MICROBIOLOGY DEPARTMENT WITHIN ONE WEEK IF SPECIATION AND SENSITIVITIES ARE REQUIRED. Performed at [facility], [place] [address], [place], [place] [address]    Report Status 08/15/2020 FINAL  Final  Blood Culture ID Panel (Reflexed)     Status: Abnormal   Collection Time: 08/13/20 12:25 PM  Result Value Ref Range Status   Enterococcus faecalis NOT DETECTED NOT DETECTED Final   Enterococcus Faecium NOT DETECTED NOT DETECTED Final   Listeria monocytogenes NOT DETECTED NOT DETECTED Final   Staphylococcus species DETECTED (A) NOT DETECTED Final    Comment: CRITICAL RESULT CALLED TO, READ BACK BY AND VERIFIED WITH: PHARMD S KOEN [id number] FCP    Staphylococcus aureus (BCID) NOT DETECTED NOT DETECTED Final   Staphylococcus epidermidis NOT DETECTED NOT DETECTED Final   Staphylococcus lugdunensis NOT DETECTED NOT DETECTED Final   Streptococcus species NOT DETECTED NOT DETECTED Final   Streptococcus agalactiae NOT DETECTED NOT DETECTED Final   Streptococcus pneumoniae NOT DETECTED NOT DETECTED Final   Streptococcus pyogenes NOT DETECTED NOT DETECTED Final   A.calcoaceticus-baumannii NOT DETECTED NOT DETECTED Final   Bacteroides fragilis NOT  DETECTED NOT DETECTED Final   Enterobacterales NOT DETECTED NOT DETECTED Final   Enterobacter cloacae complex NOT DETECTED NOT DETECTED Final   Escherichia coli  NOT DETECTED NOT DETECTED Final   Klebsiella aerogenes NOT DETECTED NOT DETECTED Final   Klebsiella oxytoca NOT DETECTED NOT DETECTED Final   Klebsiella pneumoniae NOT DETECTED NOT DETECTED Final   Proteus species NOT DETECTED NOT DETECTED Final   Salmonella species NOT DETECTED NOT DETECTED Final   Serratia marcescens NOT DETECTED NOT DETECTED Final   Haemophilus influenzae NOT DETECTED NOT DETECTED Final   Neisseria meningitidis NOT DETECTED NOT DETECTED Final   Pseudomonas aeruginosa NOT DETECTED NOT DETECTED Final   Stenotrophomonas maltophilia NOT DETECTED NOT DETECTED Final   Candida albicans NOT DETECTED NOT DETECTED Final   Candida auris NOT DETECTED NOT DETECTED Final   Candida glabrata NOT DETECTED NOT DETECTED Final   Candida krusei NOT DETECTED NOT DETECTED Final   Candida parapsilosis NOT DETECTED NOT DETECTED Final   Candida tropicalis NOT DETECTED NOT DETECTED Final   Cryptococcus neoformans/gattii NOT DETECTED NOT DETECTED Final    Comment: Performed at [facility], [place] [address] [place], [place] [address]  Expectorated Sputum Assessment w Gram Stain, Rflx to Resp Cult     Status: None   Collection Time: 08/13/20 12:31 PM   Specimen: Expectorated Sputum  Result Value Ref Range Status   Specimen Description EXPECTORATED SPUTUM  Final   Special Requests NONE  Final   Sputum evaluation   Final    THIS SPECIMEN IS ACCEPTABLE FOR SPUTUM CULTURE Performed at [facility], [place] [address] [place], [place] [address]    Report Status 08/13/2020 FINAL  Final  Culture, Respiratory w Gram Stain     Status: None   Collection Time: 08/13/20 12:31 PM  Result Value Ref Range Status   Specimen Description EXPECTORATED SPUTUM  Final   Special Requests NONE Reflexed from [id number]  Final   Gram Stain   Final     ABUNDANT WBC PRESENT, PREDOMINANTLY PMN MODERATE GRAM POSITIVE RODS RARE GRAM POSITIVE COCCI    Culture   Final    Normal respiratory flora-no Staph aureus or Pseudomonas seen Performed at [facility], [place] [address], [place], [place] [address]    Report Status 08/16/2020 FINAL  Final  Resp Panel by RT-PCR (Flu A&B, Covid) Nasopharyngeal Swab     Status: None   Collection Time: 08/13/20 12:46 PM   Specimen: Nasopharyngeal Swab; Nasopharyngeal(NP) swabs in vial transport medium  Result Value Ref Range Status   SARS Coronavirus 2 by RT PCR NEGATIVE NEGATIVE Final    Comment: (NOTE) SARS-CoV-2 target nucleic acids are NOT DETECTED.  The SARS-CoV-2 RNA is generally detectable in upper respiratory specimens during the acute phase of infection. The lowest concentration of SARS-CoV-2 viral copies this assay can detect is 138 copies/mL. A negative result does not preclude SARS-Cov-2 infection and should not be used as the sole basis for treatment or other patient management decisions. A negative result may occur with  improper specimen collection/handling, submission of specimen other than nasopharyngeal swab, presence of viral mutation(s) within the areas targeted by this assay, and inadequate number of viral copies(<138 copies/mL). A negative result must be combined with clinical observations, patient history, and epidemiological information. The expected result is Negative.  Fact Sheet for Patients:  EntrepreneurPulse.com.au  Fact Sheet for Healthcare Providers:  IncredibleEmployment.be  This test is no t yet approved or cleared by the [place] FDA and  has been authorized for detection and/or diagnosis of SARS-CoV-2 by FDA under an Emergency Use Authorization (EUA). This EUA will remain  in effect (meaning  this test can be used) for the duration of the COVID-19 declaration under Section 564(b)(1) of the Act, 21 U.S.C.section  360bbb-3(b)(1), unless the authorization is terminated  or revoked sooner.       Influenza A by PCR NEGATIVE NEGATIVE Final   Influenza B by PCR NEGATIVE NEGATIVE Final    Comment: (NOTE) The Xpert Xpress SARS-CoV-2/FLU/RSV plus assay is intended as an aid in the diagnosis of influenza from Nasopharyngeal swab specimens and should not be used as a sole basis for treatment. Nasal washings and aspirates are unacceptable for Xpert Xpress SARS-CoV-2/FLU/RSV testing.  Fact Sheet for Patients: EntrepreneurPulse.com.au  Fact Sheet for Healthcare Providers: IncredibleEmployment.be  This test is not yet approved or cleared by the [place] FDA and has been authorized for detection and/or diagnosis of SARS-CoV-2 by FDA under an Emergency Use Authorization (EUA). This EUA will remain in effect (meaning this test can be used) for the duration of the COVID-19 declaration under Section 564(b)(1) of the Act, 21 U.S.C. section 360bbb-3(b)(1), unless the authorization is terminated or revoked.  Performed at [facility], [place] [address], [place], [place] [address]   MRSA PCR Screening     Status: None   Collection Time: 08/13/20  6:17 PM   Specimen: Nasal Mucosa; Nasopharyngeal  Result Value Ref Range Status   MRSA by PCR NEGATIVE NEGATIVE Final    Comment:        The GeneXpert MRSA Assay (FDA approved for NASAL specimens only), is one component of a comprehensive MRSA colonization surveillance program. It is not intended to diagnose MRSA infection nor to guide or monitor treatment for MRSA infections. Performed at [facility], [place] [address] [place], [place] [address]   MRSA PCR Screening     Status: None   Collection Time: 08/16/20 11:35 AM   Specimen: Nasal Mucosa; Nasopharyngeal  Result Value Ref Range Status   MRSA by PCR NEGATIVE NEGATIVE Final    Comment:        The GeneXpert MRSA Assay (FDA approved for NASAL  specimens only), is one component of a comprehensive MRSA colonization surveillance program. It is not intended to diagnose MRSA infection nor to guide or monitor treatment for MRSA infections. Performed at [facility], [place] [address], [place], [place] [address]      Radiology Studies: No results found.  Marzetta Board, MD, PhD [facility]  Between 7 am - 7 pm I am available, please contact me via Amion (for emergencies) or Securechat (non urgent messages)  Between 7 pm - 7 am I am not available, please contact night coverage MD/APP via Amion

## 2020-08-23 NOTE — Evaluation (Signed)
Clinical/Bedside Swallow Evaluation Patient Details  Name: Steve [name] MRN: [id number] Date of Birth: 11-Jun-1955  Today's Date: 08/23/2020 Time: SLP Start Time (ACUTE ONLY): 1145 SLP Stop Time (ACUTE ONLY): 1210 SLP Time Calculation (min) (ACUTE ONLY): 25 min  Past Medical History:  Past Medical History:  Diagnosis Date  . Abscess of muscle 08/10/2011   staph infection of right hip   . Acute on chronic respiratory failure with hypoxia ([place])   . Acute respiratory distress syndrome (ARDS) due to COVID-19 virus ([place])   . Anxiety   . Diabetes mellitus without complication ([place])   . GERD (gastroesophageal reflux disease)    rare reflux - no meds for reflux - NO PROBLEM IN PAST SEVERAL YRS  . HCAP (healthcare-associated pneumonia)   . Hip dysplasia, congenital    no surgery as a child for hip dysplasia - has had bilateral hip replacements as an adult  . Hodgkin lymphoma ([place])   . Hypertension   . Pancreatitis   . Pneumothorax, acute   . Postoperative anemia due to acute blood loss 09/07/2012  . Septic arthritis of hip ([place]) 09/05/2012   PT'S TOTAL HIP JOINT REMOVED - ANTIBIOTIC SPACE PLACED AND PT HAS FINISHED IV ANTIBIOTICS ( PICC LINE REMOVED)  . Sleep apnea    USES CPAP   Past Surgical History:  Past Surgical History:  Procedure Laterality Date  . COLONOSCOPY  04/26/2007  . HERNIA REPAIR     inguinal hernia x3  . IR GASTROSTOMY TUBE MOD SED  08/22/2019  . IR IMAGING GUIDED PORT INSERTION  03/29/2019  . JOINT REPLACEMENT  2002 & 2007   bilateral hip replacement  . LYMPH NODE BIOPSY Right 03/20/2019   Procedure: DEEP RIGHT SUPRACLAVICULAR LYMPH NODE EXCISION;  Surgeon: Fanny Skates, MD;  Location: [facility];  Service: General;  Laterality: Right;  . MULTIPLE EXTRACTIONS WITH ALVEOLOPLASTY  07/28/2011   Procedure: MULTIPLE EXTRACION WITH ALVEOLOPLASTY;  Surgeon: Lenn Cal, DDS;  Location: [facility];  Service: Oral Surgery;  Laterality: N/A;  Extraction of  tooth #'s 2,3,4,5,6,11,12,13,15,19,22 with alveoloplasty.  . shoulder repair - right for separation of shoulder    . TEE WITHOUT CARDIOVERSION  07/29/2011   Procedure: TRANSESOPHAGEAL ECHOCARDIOGRAM (TEE);  Surgeon: Josue Hector, MD;  Location: [facility];  Service: Cardiovascular;  Laterality: N/A;  . TOTAL HIP REVISION Right 09/05/2012   Procedure: RIGHT HIP RESECTION ARTHROPLASTY WITH ANTIBIOTIC SPACERS;  Surgeon: [name], MD;  Location: [facility];  Service: Orthopedics;  Laterality: Right;  . TOTAL HIP REVISION Right 11/30/2012   Procedure: RIGHT TOTAL HIP ARTHROPLASTY REIMPLANTATION;  Surgeon: [name], MD;  Location: [facility];  Service: Orthopedics;  Laterality: Right;   HPI:  65 year old male with severe episode of QQPYP-95 in 0932 complicated by ARDS, pulmonary fibrosis, Hodgkin's lymphoma on chemotherapy, hypertension, now with chronic trach and hypoxic respiratory failure admitted to the hospital with worsening respiratory failure, increased secretions and increased oxygen requirements secondary to presumed pneumonia versus chemotherapy induced pneumonitis.  He was placed on broad-spectrum antibiotics as well as steroids.  Patient has PEG for primary source of nutrition however also consumes pos at home prior to admission (moist solids and thin liquids). Had a MBS 04/08/20 which indicated good enough airway protection to initiate a po diet. Most [facility] from [facility] indicate that at that time (05/2020), patient safe for a dyspahgia 3 diet with thin liquids.   Assessment / Plan / Recommendation Clinical Impression  Swallowing function appears  consistent with previous SLP notes from 05/2020 in which patient appeared to be protecting airway well and appropriate for po intake. Again, oral phase timely with the exception of regular texture solids which result in prolongued mastication as patient is missing dentures (family to bring). No overt s/s of aspiration noted across  consistencies. Note that patient aphonic, PMV not in place. Per notes, patient had previously not been able to tolerate valve for more than 3-4 minutes before desaturating. Per patient, this has continued at home. Had a trach change as OP and has now had another during this admission. Patient appears safe to resume a po diet, prefering pureed solids to start until he can be assessed with dentures. Recommend dietary consult as patient states he would like to be consuming more by mouth however is not necessarily hungry with full tube feeds also going through PEG. Requested that family also bring in [place] so SLP can attempt to faciliate verbal communication. MD please order PMV eval. SLP Visit Diagnosis: Dysphagia, unspecified (R13.10)    Aspiration Risk  Mild aspiration risk    Diet Recommendation Dysphagia 1 (Puree);Thin liquid   Liquid Administration via: Cup;Straw Medication Administration: Via alternative means Supervision: Patient able to self feed Compensations: Slow rate;Small sips/bites Postural Changes: Seated upright at 90 degrees    Other  Recommendations Oral Care Recommendations: Oral care BID   Follow up Recommendations  (TBD)      Frequency and Duration min 2x/week  1 week       Prognosis Prognosis for Safe Diet Advancement: Good      Swallow Study   General HPI: 65 year old male with severe episode of TKWIO-97 in 3532 complicated by ARDS, pulmonary fibrosis, Hodgkin's lymphoma on chemotherapy, hypertension, now with chronic trach and hypoxic respiratory failure admitted to the hospital with worsening respiratory failure, increased secretions and increased oxygen requirements secondary to presumed pneumonia versus chemotherapy induced pneumonitis.  He was placed on broad-spectrum antibiotics as well as steroids.  Patient has PEG for primary source of nutrition however also consumes pos at home prior to admission (moist solids and thin liquids). Had a MBS 04/08/20 which  indicated good enough airway protection to initiate a po diet. Most recent notes from SLP at [facility] indicate that at that time (05/2020), patient safe for a dyspahgia 3 diet with thin liquids. Type of Study: Bedside Swallow Evaluation Previous Swallow Assessment: see HPI Diet Prior to this Study: NPO;PEG tube Temperature Spikes Noted: No Respiratory Status: Trach Collar;Trach Trach Size and Type: #6;Cuff History of Recent Intubation: No (did require vent support this admission) Behavior/Cognition: Alert;Cooperative;Pleasant mood Oral Cavity Assessment: Within Functional Limits Oral Care Completed by SLP: No Oral Cavity - Dentition: Edentulous (family to bring in dentures from home) Vision: Functional for self-feeding Self-Feeding Abilities: Able to feed self Patient Positioning: Upright in bed Baseline Vocal Quality: Aphonic (trach without PMV (at home and with poor tolerance per patient)) Volitional Cough: Strong Volitional Swallow: Able to elicit    Oral/Motor/Sensory Function Overall Oral Motor/Sensory Function: Within functional limits   Ice Chips Ice chips: Within functional limits Presentation: Spoon;Self Fed   Thin Liquid Thin Liquid: Within functional limits Presentation: Straw    Nectar Thick Nectar Thick Liquid: Not tested   Honey Thick Honey Thick Liquid: Not tested   Puree Puree: Within functional limits Presentation: Self Fed;Spoon   Solid     Solid: Impaired Presentation: Self Fed Oral Phase Impairments: Impaired mastication Oral Phase Functional Implications: Impaired mastication (due to missing dentition)  Gabriel Rainwater MA, CCC-SLP   Adah Stoneberg Meryl 08/23/2020,12:42 PM

## 2020-08-24 DIAGNOSIS — J189 Pneumonia, unspecified organism: Secondary | ICD-10-CM | POA: Diagnosis not present

## 2020-08-24 DIAGNOSIS — J9621 Acute and chronic respiratory failure with hypoxia: Secondary | ICD-10-CM | POA: Diagnosis not present

## 2020-08-24 DIAGNOSIS — A419 Sepsis, unspecified organism: Secondary | ICD-10-CM | POA: Diagnosis not present

## 2020-08-24 DIAGNOSIS — D638 Anemia in other chronic diseases classified elsewhere: Secondary | ICD-10-CM | POA: Diagnosis not present

## 2020-08-24 LAB — GLUCOSE, CAPILLARY
Glucose-Capillary: 133 mg/dL — ABNORMAL HIGH (ref 70–99)
Glucose-Capillary: 146 mg/dL — ABNORMAL HIGH (ref 70–99)
Glucose-Capillary: 153 mg/dL — ABNORMAL HIGH (ref 70–99)
Glucose-Capillary: 153 mg/dL — ABNORMAL HIGH (ref 70–99)
Glucose-Capillary: 174 mg/dL — ABNORMAL HIGH (ref 70–99)
Glucose-Capillary: 196 mg/dL — ABNORMAL HIGH (ref 70–99)
Glucose-Capillary: 199 mg/dL — ABNORMAL HIGH (ref 70–99)
Glucose-Capillary: 227 mg/dL — ABNORMAL HIGH (ref 70–99)

## 2020-08-24 LAB — CBC
HCT: 38.4 % — ABNORMAL LOW (ref 39.0–52.0)
Hemoglobin: 11.6 g/dL — ABNORMAL LOW (ref 13.0–17.0)
MCH: 28.2 pg (ref 26.0–34.0)
MCHC: 30.2 g/dL (ref 30.0–36.0)
MCV: 93.2 fL (ref 80.0–100.0)
Platelets: 230 10*3/uL (ref 150–400)
RBC: 4.12 MIL/uL — ABNORMAL LOW (ref 4.22–5.81)
RDW: 21.4 % — ABNORMAL HIGH (ref 11.5–15.5)
WBC: 9.5 10*3/uL (ref 4.0–10.5)
nRBC: 0 % (ref 0.0–0.2)

## 2020-08-24 LAB — BASIC METABOLIC PANEL
Anion gap: <3 — ABNORMAL LOW (ref 5–15)
BUN: 25 mg/dL — ABNORMAL HIGH (ref 8–23)
CO2: 33 mmol/L — ABNORMAL HIGH (ref 22–32)
Calcium: 7.8 mg/dL — ABNORMAL LOW (ref 8.9–10.3)
Chloride: 104 mmol/L (ref 98–111)
Creatinine, Ser: 0.5 mg/dL — ABNORMAL LOW (ref 0.61–1.24)
GFR, Estimated: >60 mL/min (ref >60)
Glucose, Bld: 94 mg/dL (ref 70–99)
Potassium: 3.8 mmol/L (ref 3.5–5.1)
Sodium: 138 mmol/L (ref 135–145)

## 2020-08-24 MED ORDER — PREDNISONE 20 MG PO TABS
40.0000 mg | ORAL_TABLET | Freq: Every day | ORAL | Status: DC
  Administered 2020-08-24 – 2020-08-25 (×2): 40 mg via ORAL
  Filled 2020-08-24 (×2): qty 2

## 2020-08-24 NOTE — Progress Notes (Signed)
PROGRESS NOTE  Steve [name] GXQ:[id number] DOB: 1956/01/02 DOA: 08/13/2020 PCP: [name], PA-C   LOS: 11 days   Brief Narrative / Interim history: 65 year old male with severe episode of [id number] in 6314 complicated by ARDS, pulmonary fibrosis, Hodgkin's lymphoma on chemotherapy, hypertension, now with chronic trach and hypoxic respiratory failure admitted to the hospital with worsening respiratory failure, increased secretions and increased oxygen requirements secondary to presumed pneumonia versus chemotherapy induced pneumonitis.  He was placed on broad-spectrum antibiotics as well as steroids.  He was initially admitted to the hospitalist service but then transferred to the ICU as his vent requirement increased  Subjective / 24h Interval events: Feels the strongest he's been since being here  Assessment & Plan: Principal Problem Acute on chronic hypoxic respiratory failure in the setting of post-COVID ILD, hospital-acquired pneumonia versus chemotherapy induced pneumonitis (Brentuximab) -patient was initially admitted to the ICU given high oxygen requirements, he was placed on broad-spectrum antibiotics and completed 7 days of cefepime.  He was also placed on high-dose of steroids given concern for chemotherapy induced pneumonitis.  He was initially on vent but then eventually weaned back off to trach collar.  He is now back to his home oxygenation.  Stop IV steroids today and placed on prednisone, will need a slow taper.  If respiratory status remains stable over the next 24 hours he will go home tomorrow  Active Problems Generalized failure to thrive in the setting of ARDS, COVID-19, trach dependent, severe malnutrition -Appreciate nutrition input, continue feeding per PEG tube.  Patient has lost 20 pounds in the last month Hypotension-patient's blood pressure in the outpatient setting was [phone] systolic, he appears to be in the 90s now, he is asymptomatic with this.  Suspect  related to his progressive failure to thrive/weight loss may be more hypotensive now.  Continue low-dose midodrine, blood pressure improved Hodgkin's lymphoma -Dr. Marin Olp followed patient while hospitalized, may need to think about changing chemotherapy but this can be done as an outpatient Anemia of chronic illness, underlying malignancy -Monitor CBC, no bleeding.  He was transfused a total of 2 units of packed red blood cells on 5/9 and hemoglobin has remained stable since. Type 2 diabetes mellitus -Continue Lantus, sliding scale.  Had an episode of hypoglycemia yesterday, decrease Lantus as well as NovoLog  CBG (last 3)  Recent Labs    08/24/20 0026 08/24/20 0408 08/24/20 0757  GLUCAP 196* 146* 153*    Scheduled Meds: . arformoterol  15 mcg Nebulization BID  . budesonide (PULMICORT) nebulizer solution  0.25 mg Nebulization BID  . chlorhexidine gluconate (MEDLINE KIT)  15 mL Mouth Rinse BID  . Chlorhexidine Gluconate Cloth  6 each Topical Daily  . enoxaparin (LOVENOX) injection  40 mg Subcutaneous Q24H  . feeding supplement (PROSource TF)  45 mL Per Tube TID  . free water  180 mL Per Tube Q6H  . insulin aspart  0-15 Units Subcutaneous Q4H  . insulin glargine  8 Units Subcutaneous QHS  . magnesium oxide  400 mg Per Tube BID  . mouth rinse  15 mL Mouth Rinse 10 times per day  . midodrine  5 mg Oral TID WC  . multivitamin  15 mL Per Tube Daily  . pantoprazole sodium  40 mg Per Tube Daily  . predniSONE  40 mg Oral Q breakfast  . sertraline  50 mg Per Tube Daily  . sodium chloride flush  10-40 mL Intracatheter Q12H  . traZODone  100 mg Per Tube QHS  Continuous Infusions: . sodium chloride 250 mL (08/22/20 2341)  . feeding supplement (GLUCERNA 1.2 CAL) 1,000 mL (08/24/20 0527)   PRN Meds:.sodium chloride, albuterol, calamine, chlorhexidine, guaiFENesin, hyoscyamine, lidocaine-prilocaine, loperamide HCl, LORazepam, simethicone, sodium chloride flush, triamcinolone cream  Diet  Orders (From admission, onward)    Start     Ordered   08/23/20 1244  DIET - DYS 1 Room service appropriate? Yes; Fluid consistency: Thin  Diet effective now       Question Answer Comment  Room service appropriate? Yes   Fluid consistency: Thin      08/23/20 1243         DVT prophylaxis: enoxaparin (LOVENOX) injection 40 mg Start: 08/16/20 2000    Code Status: Full Code  Family Communication: No family at bedside  Status is: Inpatient  Remains inpatient appropriate because:Inpatient level of care appropriate due to severity of illness   Dispo: The patient is from: Home              Anticipated d/c is to: Home              Patient currently is not medically stable to d/c.   Difficult to place patient No   Level of care: Progressive  Consultants:  PCCM Oncology   Procedures:  none  Microbiology  Respiratory culture-gram-positive rods, gram-positive cocci Blood cultures 1/4 bottles-Staph warneri  Cultures-diphtheroids 5000 colonies  Antimicrobials: Cefepime    Objective: Vitals:   08/24/20 0412 08/24/20 0420 08/24/20 0818 08/24/20 0841  BP: 99/66  102/68   Pulse: 75 74 79   Resp: _0 Temp: 97.8 F (36.6 C)  97.8 F (36.6 C)   TempSrc: Oral  Oral   SpO2: 98% 98% 94% 98%  Weight:      Height:        Intake/Output Summary (Last 24 hours) at 08/24/2020 1046 Last data filed at 08/24/2020 0800 Gross per 24 hour  Intake 358 ml  Output 2375 ml  Net -2017 ml   Filed Weights   08/16/20 0549 08/17/20 0533 08/21/20 0541  Weight: 65.1 kg 61.2 kg 52.6 kg    Examination:  Constitutional: NAD, in bed, comfortable Eyes: No scleral icterus ENMT: mmm Neck: normal, supple Respiratory: Clear bilaterally, no wheezing, no crackles Cardiovascular: Regular rate and rhythm, no murmurs, no edema Abdomen: Soft, NT, ND, bowel sounds positive, PEG tube in place Musculoskeletal: no clubbing / cyanosis.  Skin: No rashes Neurologic: Nonfocal  Data Reviewed: I  have independently reviewed following labs and imaging studies   CBC: Recent Labs  Lab 08/19/20 0400 08/20/20 0400 08/21/20 0302 08/22/20 0345 08/24/20 0510  WBC 12.8* 12.4* 11.7* 10.0 9.5  NEUTROABS  --   --  9.2*  --   --   HGB 10.0* 10.5* 11.1* 11.2* 11.6*  HCT 32.4* 34.9* 36.5* 37.7* 38.4*  MCV 90.5 90.9 92.4 93.8 93.2  PLT 198 232 231 217 536   Basic Metabolic Panel: Recent Labs  Lab 08/18/20 0318 08/18/20 1954 08/19/20 0400 08/20/20 0400 08/21/20 0302 08/22/20 0345 08/24/20 0510  NA 138  --  136 135 134* 135 138  K 4.3  --  4.2 4.2 4.4 4.3 3.8  CL 92*  --  91* 95* 96* 95* 104  CO2 41*  --  37* 36* 35* 35* 33*  GLUCOSE 250*  --  155* 145* 158* 176* 94  BUN 26*  --  28* 26* 29* 29* 25*  CREATININE 0.55*  --  0.58* 0.55* 0.58* 0.55*  0.50*  CALCIUM 8.6*  --  8.9 8.8* 8.6* 8.7* 7.8*  MG 2.2  --  2.2 2.2  --   --   --   PHOS 1.6* 2.9 3.0 3.2  --   --   --    Liver Function Tests: Recent Labs  Lab 08/22/20 0345  AST 21  ALT 36  ALKPHOS 51  BILITOT 0.7  PROT 5.7*  ALBUMIN 2.5*   Coagulation Profile: No results for input(s): INR, PROTIME in the last 168 hours. HbA1C: No results for input(s): HGBA1C in the last 72 hours. CBG: Recent Labs  Lab 08/23/20 1652 08/23/20 2035 08/24/20 0026 08/24/20 0408 08/24/20 0757  GLUCAP 139* 147* 196* 146* 153*    Recent Results (from the past 240 hour(s))  MRSA PCR Screening     Status: None   Collection Time: 08/16/20 11:35 AM   Specimen: Nasal Mucosa; Nasopharyngeal  Result Value Ref Range Status   MRSA by PCR NEGATIVE NEGATIVE Final    Comment:        The GeneXpert MRSA Assay (FDA approved for NASAL specimens only), is one component of a comprehensive MRSA colonization surveillance program. It is not intended to diagnose MRSA infection nor to guide or monitor treatment for MRSA infections. Performed at [facility], [place] [address] [place], [place] [address]      Radiology Studies: No results  found.  Marzetta Board, MD, PhD [facility]  Between 7 am - 7 pm I am available, please contact me via Amion (for emergencies) or Securechat (non urgent messages)  Between 7 pm - 7 am I am not available, please contact night coverage MD/APP via Amion

## 2020-08-24 NOTE — Progress Notes (Signed)
Breathing comfortable.  Trach site clean.  B/l crackles on respiratory exam.  SpO2 95 to 98% on TC 40%.  States he might be going home tomorrow.  Followed by [name] in trach clinic and Dr. [name] in pulmonary office.  Assessment: Acute on chronic hypoxic respiratory failure from brentuximab induced pneumonitis with history of post-COVID ILD Tracheostomy status Hodgkin's lymphoma  Plan: - continue prednisone 40 mg daily until he has outpt follow up - continue bronchodilators - f/u chest imaging intermittently - follow up in trach clinic - goal SpO2 90 to 95%  Chesley Mires, MD [place] Pager - [phone] 08/24/2020, 11:39 AM

## 2020-08-24 NOTE — Progress Notes (Signed)
Nutrition Follow-up  DOCUMENTATION CODES:   Severe malnutrition in context of chronic illness  INTERVENTION:   Continue TF via PEG: Glucerna 1.2 at 60 ml/h (1440 ml daily) Prosource TF 45 ml TID  Provides: 1848 kcal, 119 gm protein, 1159 ml free water daily.  Free water flushes 180 ml every 6 hours for a total of 1879 ml free water daily.  Continue Dysphagia 3 diet with thin liquids.   NUTRITION DIAGNOSIS:   Severe Malnutrition related to chronic illness (pulmonary fibrosis, Hodgkin's lymphoma) as evidenced by severe muscle depletion,severe fat depletion.  Ongoing   GOAL:   Patient will meet greater than or equal to 90% of their needs  Met with TF  MONITOR:   TF tolerance,Labs,Skin,Diet advancement  REASON FOR ASSESSMENT:   Consult Enteral/tube feeding initiation and management  ASSESSMENT:   65 yo male admitted with SOB. PMH includes chronic trach, COVID-19 ARDS, pulmonary fibrosis, PEG, HTN, DM-2, Hodgkin's lymphoma on chemo, GERD, pancreatitis.  Patient is now on trach collar. SLP following. Dysphagia 1-thin liquids started 5/15. Advanced to dysphagia 3 with thin liquids this morning. Continues to receive TF via PEG: Glucerna 1.2 at 60 ml/h with Prosource TF 45 ml TID to provide 1848 kcal, 119 gm protein, 1159 ml free water daily. Free water flushes 180 ml every 6 hours for a total of 1879 ml per day.  Glucose levels have improved with switch to Glucerna 1.2 TF formula.  Current TF is meeting 95% of minimum kcal needs and 100% of protein needs. PO intake will provide extra calories and protein. Discussed with patient plans to continue TF via PEG as his nutrition needs are very high.   Patient reports good tolerance of pureed foods and thin liquids. Intake of meals is good. He consumed 100% of breakfast today. He is excited to potentially be discharged home tomorrow.   Labs reviewed.  CBG: [phone]  Medications reviewed and include Novolog, Lantus,  Mag-Ox, solu-medrol, liquid MVI, Protonix, prednisone.  Weight 52.6 kg on 5/13.  Diet Order:   Diet Order            DIET DYS 3 Room service appropriate? Yes; Fluid consistency: Thin  Diet effective now                 EDUCATION NEEDS:   Not appropriate for education at this time  Skin:  Skin Assessment: Skin Integrity Issues: Skin Integrity Issues:: DTI DTI: sacrum Stage II: N/A  Last BM:  5/12 type 6  Height:   Ht Readings from Last 1 Encounters:  08/19/20 5' 5"  (1.651 m)    Weight:   Wt Readings from Last 1 Encounters:  08/21/20 52.6 kg    BMI:  Body mass index is 19.3 kg/m.  Estimated Nutritional Needs:   Kcal:  1950-2150  Protein:  100-120 gm  Fluid:  >/= 2 L    Lucas Mallow, RD, LDN, CNSC Please refer to Amion for contact information.

## 2020-08-24 NOTE — Progress Notes (Signed)
  Speech Language Pathology Treatment: Dysphagia  Patient Details Name: Steve [name] MRN: [id number] DOB: 1956/01/09 Today's Date: 08/24/2020 Time: 5573-2202 SLP Time Calculation (min) (ACUTE ONLY): 26 min  Assessment / Plan / Recommendation Clinical Impression  Pt has his dentures today and was eager to try more solid consistencies. He reiterates that at home he was receiving most of his nutrition via PEG, but that he was starting to consume more PO (especially at breakfast). He consumed solids and liquids today, both with PMV on and off, with no overt changes or signs of dysphagia. Will advance diet to baseline textures of Dys 3 solids and thin liquids. Education was provided about aspiration precautions. Will continue to follow acutely.   HPI HPI: 65 year old male with severe episode of RKYHC-62 in 3762 complicated by ARDS, pulmonary fibrosis, Hodgkin's lymphoma on chemotherapy, hypertension, now with chronic trach and hypoxic respiratory failure admitted to the hospital with worsening respiratory failure, increased secretions and increased oxygen requirements secondary to presumed pneumonia versus chemotherapy induced pneumonitis.  He was placed on broad-spectrum antibiotics as well as steroids.  Patient has PEG for primary source of nutrition however also consumes pos at home prior to admission (moist solids and thin liquids). Had a MBS 04/08/20 which indicated good enough airway protection to initiate a po diet. Most recent notes from SLP at [facility] indicate that at that time (05/2020), patient safe for a dyspahgia 3 diet with thin liquids.      SLP Plan  Continue with current plan of care       Recommendations  Diet recommendations: Dysphagia 3 (mechanical soft);Thin liquid Liquids provided via: Cup;Straw Medication Administration: Via alternative means Supervision: Patient able to self feed;Intermittent supervision to cue for compensatory strategies Compensations: Slow rate;Small  sips/bites Postural Changes and/or Swallow Maneuvers: Seated upright 90 degrees                Oral Care Recommendations: Oral care BID Follow up Recommendations: Home health SLP SLP Visit Diagnosis: Dysphagia, unspecified (R13.10) Plan: Continue with current plan of care       GO                Osie Bond., M.A. [facility] Pager [phone] Office [phone]  08/24/2020, 12:18 PM

## 2020-08-24 NOTE — TOC Initial Note (Signed)
Transition of Care [facility]) - Initial/Assessment Note    Patient Details  Name: Steve [name] MRN: [id number] Date of Birth: 1955-12-04  Transition of Care [facility]) CM/SW Contact:    Bethena Roys, RN Phone Number: 08/24/2020, 6:19 PM  Clinical Narrative: Risk for readmission assessment completed. Prior to arrival patient was from home with spouse. Patient has trach supplies, peg tube supplies and oxygen at home via [employer]. Case Manger received permission from patient to call his spouse for further home needs. Case Manager called wife [name] and she assists patient with his trach and peg tube. Patient will need home health PT and he can benefit from a home health aide. Orders will need to be placed in Epic with [facility]. Case Manager called [facility] and they cannot assist due to patient has a trach. Case Manager called [facility]) to see if they can assist. [facility] is in network with [place]. Awaiting to hear back from the liaison. Please update wife with [place]. Case Manager will continue to follow.                Expected Discharge Plan: [place] Barriers to Discharge: Continued Medical Work up   Patient Goals and CMS Choice Patient states their goals for this hospitalization and ongoing recovery are:: to return home.   Choice offered to / list presented to : Spouse (Case Manager spoke with wife. Stated last admission pt was to have [place]- no one showed up. Didn't see an agency in last note. [facility]  Expected Discharge Plan and Services Expected Discharge Plan: [place] In-house Referral: NA Discharge Planning Services: CM Consult Post Acute Care Choice: [place] Number One arrangements for the past 2 months: Mobile Home                   DME Agency: NA       HH Arranged: PT,Nurse's Aide [place] Agency: [place] ([facility]) Date [place]: 08/24/20 Time [place]: 1705 Representative spoke with at [place]: [place] Arrangements/Services Living arrangements for the past 2 months: Mobile Home Lives with:: Self,Spouse Patient language and need for interpreter reviewed:: Yes Do you feel safe going back to the place where you live?: Yes      Need for Family Participation in Patient Care: Yes (Comment) Care giver support system in place?: Yes (comment) Current home services: DME (Pt has oxygen, trach supplies.) Criminal Activity/Legal Involvement Pertinent to Current Situation/Hospitalization: No - Comment as needed  Activities of Daily Living Home Assistive Devices/Equipment: Hospital bed,Vent/Trach supplies,Feeding equipment,Nebulizer,Oxygen,Other (Comment),CBG Meter,Walker (specify type),Wheelchair ADL Screening (condition at time of admission) Patient's cognitive ability adequate to safely complete daily activities?: Yes Is the patient deaf or have difficulty hearing?: No Does the patient have difficulty seeing, even when wearing glasses/contacts?: No Does the patient have difficulty concentrating, remembering, or making decisions?: No Patient able to express need for assistance with ADLs?: Yes Does the patient have difficulty dressing or bathing?: Yes Independently performs ADLs?: No Communication: Needs assistance Is this a change from baseline?: Pre-admission baseline Dressing (OT): Needs assistance Is this a change from baseline?: Pre-admission baseline Grooming: Needs assistance Is this a change from baseline?: Pre-admission baseline Feeding: Needs assistance Is this a change from baseline?: Pre-admission baseline Bathing: Needs assistance Is this a change from baseline?: Pre-admission baseline Toileting: Needs assistance Is this a change from baseline?: Pre-admission baseline  In/Out Bed: Needs assistance Is this a change from baseline?: Pre-admission baseline Walks in Home: Dependent Is this a change  from baseline?: Pre-admission baseline Does the patient have difficulty walking or climbing stairs?: Yes Weakness of Legs: Both Weakness of Arms/Hands: None  Permission Sought/Granted Permission sought to share information with : [place] granted to share information with : Yes, Verbal Permission Granted     Permission granted to share info w AGENCY: [place]        Emotional Assessment Appearance:: Appears stated age Attitude/Demeanor/Rapport: Engaged Affect (typically observed): Appropriate Orientation: : Oriented to Situation,Oriented to  Time,Oriented to Place,Oriented to Self Alcohol / Substance Use: Not Applicable Psych Involvement: No (comment)  Admission diagnosis:  SIRS (systemic inflammatory response syndrome) ([name]) [R65.10] Acute on chronic respiratory failure with hypoxia ([name]) [J96.21] Leukocytosis, unspecified type [D72.829] Community acquired pneumonia, unspecified laterality [J18.9] Patient Active Problem List   Diagnosis Date Noted  . Sepsis due to pneumonia ([place]) 08/13/2020  . Acute on chronic respiratory failure with hypoxia and hypercapnia ([place]) 07/21/2020  . DM (diabetes mellitus), type 2 ([place]) 07/15/2020  . Pneumonia 07/14/2020  . Pulmonary fibrosis ([place]) 05/26/2020  . Bronchiectasis ([place]) 05/26/2020  . Acute hypercapnic respiratory failure ([place]) 05/26/2020  . Acute on chronic respiratory failure with hypoxia ([place]) 05/25/2020  . Uncontrolled type 2 diabetes mellitus with hyperglycemia, with long-term current use of insulin ([place]) 05/25/2020  . Essential hypertension 05/25/2020  . GERD without esophagitis 05/25/2020  . Debility   . Anxiety   . Tracheostomy care ([place])   . Pneumothorax on right 03/23/2020  . Chronic respiratory failure with hypoxia ([place])   . ARDS (adult respiratory distress syndrome) ([place])   . Hodgkin's lymphoma ([place])   . Pneumothorax, acute   .  Healthcare-associated pneumonia   . COVID-19 virus infection   . On mechanically assisted ventilation ([place])   . Dysphagia   . Pneumothorax   . Subcutaneous air ([place])   . Tracheostomy dependence ([place])   . Pneumomediastinum ([place])   . Acute respiratory distress syndrome (ARDS) due to COVID-19 virus ([place])   . Pressure injury of skin 07/27/2019  . Acute respiratory failure with hypoxemia ([place])   . 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 Malnutrition of moderate degree 07/10/2019  . HCAP (healthcare-associated pneumonia)   . Hypoxia   . Febrile neutropenia ([place]) 05/17/2019  . COVID-19 05/17/2019  . Hyponatremia 05/17/2019  . Protein-calorie malnutrition, severe ([place]) 05/17/2019  . Neutropenic fever ([place]) 05/16/2019  . 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 Chemotherapy-induced diarrhea 04/22/2019  . Anemia due to antineoplastic chemotherapy 03/25/2019  . Hodgkin's lymphoma ([place]) 02/25/2019  . Abnormal CT scan, pancreas - tail area 01/31/2013  . 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 Septic arthritis of hip ([place]) 09/05/2012  . Routine general medical examination at a health care facility 09/12/2011  . Elbow pain 09/12/2011  . Abscess of muscle 08/10/2011  . H/O dental abscess 08/10/2011  . Staphylococcus aureus bacteremia 07/27/2011  . Abdominal pain, RLQ 09/02/2010  . HYPOGONADISM, MALE 03/23/2007  . SLEEP APNEA 03/23/2007   PCP:  [name], PA-C Pharmacy:   [id number] [place], [place] [address] Phone: [phone] Fax: [phone]   Readmission Risk Interventions Readmission Risk Prevention Plan 08/24/2020 07/22/2019  Transportation Screening Complete Complete  Medication Review (RN Care Manager) Complete Complete  HRI or Home Care Consult Complete -  SW Recovery Care/Counseling  Consult Complete -  Palliative Care Screening Not Applicable -  [place] Not Applicable -  Some recent data might be hidden

## 2020-08-24 NOTE — Evaluation (Signed)
[facility] Speaking [facility]ve - Evaluation Patient Details  Name: Steve [name] MRN: [id number] Date of Birth: [birth year]  Today's Date: 08/24/2020 Time: 0277-4128 SLP Time Calculation (min) (ACUTE ONLY): 26 min  Past Medical History:  Past Medical History:  Diagnosis Date  . Abscess of muscle 08/10/2011   staph infection of right hip   . Acute on chronic respiratory failure with hypoxia ([place])   . Acute respiratory distress syndrome (ARDS) due to COVID-19 virus ([place])   . Anxiety   . Diabetes mellitus without complication ([place])   . GERD (gastroesophageal reflux disease)    rare reflux - no meds for reflux - NO PROBLEM IN PAST SEVERAL YRS  . HCAP (healthcare-associated pneumonia)   . Hip dysplasia, congenital    no surgery as a child for hip dysplasia - has had bilateral hip replacements as an adult  . Hodgkin lymphoma ([place])   . Hypertension   . Pancreatitis   . Pneumothorax, acute   . Postoperative anemia due to acute blood loss 09/07/2012  . Septic arthritis of hip ([place]) 09/05/2012   PT'S TOTAL HIP JOINT REMOVED - ANTIBIOTIC SPACE PLACED AND PT HAS FINISHED IV ANTIBIOTICS ( PICC LINE REMOVED)  . Sleep apnea    USES CPAP   Past Surgical History:  Past Surgical History:  Procedure Laterality Date  . COLONOSCOPY  04/26/2007  . HERNIA REPAIR     inguinal hernia x3  . IR GASTROSTOMY TUBE MOD SED  08/22/2019  . IR IMAGING GUIDED PORT INSERTION  03/29/2019  . JOINT REPLACEMENT  2002 & 2007   bilateral hip replacement  . LYMPH NODE BIOPSY Right 03/20/2019   Procedure: DEEP RIGHT SUPRACLAVICULAR LYMPH NODE EXCISION;  Surgeon: Fanny Skates, MD;  Location: [facility];  Service: General;  Laterality: Right;  . MULTIPLE EXTRACTIONS WITH ALVEOLOPLASTY  07/28/2011   Procedure: MULTIPLE EXTRACION WITH ALVEOLOPLASTY;  Surgeon: Lenn Cal, DDS;  Location: [facility];  Service: Oral Surgery;  Laterality: N/A;  Extraction of tooth #'s 2,3,4,5,6,11,12,13,15,19,22 with  alveoloplasty.  . shoulder repair - right for separation of shoulder    . TEE WITHOUT CARDIOVERSION  07/29/2011   Procedure: TRANSESOPHAGEAL ECHOCARDIOGRAM (TEE);  Surgeon: Josue Hector, MD;  Location: [facility];  Service: Cardiovascular;  Laterality: N/A;  . TOTAL HIP REVISION Right 09/05/2012   Procedure: RIGHT HIP RESECTION ARTHROPLASTY WITH ANTIBIOTIC SPACERS;  Surgeon: [name], MD;  Location: [facility];  Service: Orthopedics;  Laterality: Right;  . TOTAL HIP REVISION Right 11/30/2012   Procedure: RIGHT TOTAL HIP ARTHROPLASTY REIMPLANTATION;  Surgeon: [name], MD;  Location: [facility];  Service: Orthopedics;  Laterality: Right;   HPI:  65 year old male with severe episode of [id number] in 0947 complicated by ARDS, pulmonary fibrosis, Hodgkin's lymphoma on chemotherapy, hypertension, now with chronic trach and hypoxic respiratory failure admitted to the hospital with worsening respiratory failure, increased secretions and increased oxygen requirements secondary to presumed pneumonia versus chemotherapy induced pneumonitis.  He was placed on broad-spectrum antibiotics as well as steroids.  Patient has PEG for primary source of nutrition however also consumes pos at home prior to admission (moist solids and thin liquids). Had a MBS 04/08/20 which indicated good enough airway protection to initiate a po diet. Most recent notes from SLP at [facility] indicate that at that time (05/2020), patient safe for a dyspahgia 3 diet with thin liquids.   Assessment / Plan / Recommendation Clinical Impression  Pt had some coughing initially with PMV placement, but once this subsided  he had no further signs of intolerance with 10 minutes of use. PMV was doffed in order to asses swallowing abilities both with and without PMV, but not because he was experiencing any adverse symptoms. Pt can achieve mildly improved volume with PMV in place, but his breath support is reduced so that he cannot sustain his phonation for  more than a word or so at a time. The valve therefore does not have a siginificant impact on communication at this time, but pt was educated on other additional benefits. Would continue to wear PMV intermittently as tolerated as he had been doing at home, donning and doffing the valve himself. SLP reinforced education about when to remove the valve, such as for sleeping. Education was also provided on the importance of and rationale for checking his cuff prior to placement, but would consider cuffless trach prior to discharge. SLP Visit Diagnosis: Dysphagia, unspecified (R13.10)    SLP Assessment  Patient needs continued Speech Lanaguage Pathology Services    Follow Up Recommendations  Home health SLP    Frequency and Duration min 2x/week  2 weeks    PMSV Trial PMSV was placed for: 10 min Able to redirect subglottic air through upper airway: Yes Able to Attain Phonation: Yes Voice Quality: Hoarse Able to Expectorate Secretions: No attempts Level of Secretion Expectoration with PMSV: Not observed Breath Support for Phonation: Moderately decreased Intelligibility: Intelligibility reduced Conversation: 75-100% accurate Respirations During Trial:  (WNL) Behavior: Alert;Controlled;Cooperative   Tracheostomy Tube       Vent Dependency  FiO2 (%): 40 %    Cuff Deflation Trial  GO Tolerated Cuff Deflation: Yes Length of Time for Cuff Deflation Trial: ~20 min Behavior: Alert;Controlled;Cooperative          Osie Bond., M.A. [facility] Pager [phone] Office [phone]  08/24/2020, 12:31 PM

## 2020-08-25 ENCOUNTER — Telehealth: Payer: Self-pay

## 2020-08-25 ENCOUNTER — Telehealth: Payer: Self-pay | Admitting: *Deleted

## 2020-08-25 LAB — GLUCOSE, CAPILLARY
Glucose-Capillary: 104 mg/dL — ABNORMAL HIGH (ref 70–99)
Glucose-Capillary: 99 mg/dL (ref 70–99)

## 2020-08-25 MED ORDER — HEPARIN SOD (PORK) LOCK FLUSH 100 UNIT/ML IV SOLN
500.0000 [IU] | INTRAVENOUS | Status: AC | PRN
  Administered 2020-08-25: 500 [IU]

## 2020-08-25 MED ORDER — PANTOPRAZOLE SODIUM 40 MG PO PACK: 40.0000 mg | PACK | Freq: Every day | ORAL | 0 refills | Status: DC

## 2020-08-25 MED ORDER — PREDNISONE 20 MG PO TABS: 40.0000 mg | ORAL_TABLET | Freq: Every day | ORAL | 1 refills | Status: DC

## 2020-08-25 MED ORDER — MIDODRINE HCL 5 MG PO TABS: 5.0000 mg | ORAL_TABLET | Freq: Three times a day (TID) | ORAL | 0 refills | Status: DC

## 2020-08-25 NOTE — Telephone Encounter (Signed)
Patient wife called stating that there is no way she can make appt tomorrow. Wants to reschedule with Dr Marin Olp for next available appt.  Msg sent to schedulers.

## 2020-08-25 NOTE — TOC Transition Note (Signed)
Transition of Care [facility]) - CM/SW Discharge Note   Patient Details  Name: Steve [name] MRN: [id number] Date of Birth: 11/28/1955  [facility] CM/SW Contact:  Carles Collet, RN Phone Number: 08/25/2020, 9:56 AM   Clinical Narrative:    Verified w Ramond Marrow at [facility] that patient is accepted for [facility] services. She is aware patient will DC today. Spoke w patient's wife who conforms that patient has O2, suction, all needed ambulatory DME, and DME and supplies for tube feeds and trach (both established prior to admission).  Wife states that family will be home today who are experienced in providing support. She is agreeable to discharge today through [place]. Spoke w [facility] and confirmed they can transport patient with trach on O2.  Transport set up for this morning, forms on chart, nurse notified. No other CM needs identified.     Final next level of care: [place] Barriers to Discharge: No Barriers Identified   Patient Goals and CMS Choice Patient states their goals for this hospitalization and ongoing recovery are:: to go home CMS Medicare.gov Compare Post Acute Care list provided to:: Other (Comment Required) Choice offered to / list presented to : Spouse  Discharge Placement                       Discharge Plan and Services In-house Referral: NA Discharge Planning Services: CM Consult Post Acute Care Choice: Home Health          DME Arranged: N/A DME Agency: NA       HH Arranged: PT,NA HH Agency: [facility] ([place]) Date HH Agency Contacted: 08/25/20 Time [place]: [phone] Representative spoke with at [place]: [place] Determinants of Health ([place]) Interventions     Readmission Risk Interventions Readmission Risk Prevention Plan 08/24/2020 07/22/2019  Transportation Screening Complete Complete  Medication Review Press photographer) Complete Complete  HRI or Home Care Consult Complete -  SW Recovery  Care/Counseling Consult Complete -  Palliative Care Screening Not Applicable -  [place] Not Applicable -  Some recent data might be hidden

## 2020-08-25 NOTE — Telephone Encounter (Signed)
Returned call to pts wife to r/s the 5/18 appts due to transport issue   Steve [name]

## 2020-08-25 NOTE — Discharge Summary (Signed)
Physician Discharge Summary  [name] HTD:[id number] DOB: 06-13-1955 DOA: 08/13/2020  PCP: [name], PA-C  Admit date: 08/13/2020 Discharge date: 08/25/2020  Admitted From: home Disposition:  home  Recommendations for Outpatient Follow-up:  1. Follow up with PCP in 1-2 weeks 2. Please obtain BMP/CBC in one week 3. Follow-up with pulmonary in 2 to 3 weeks  Home Health: PT Equipment/Devices: Chronic trach, chronic oxygen  Discharge Condition: Stable CODE STATUS: Full code Diet recommendation: Tube feeds  HPI: Per admitting MD, Steve [name] is a 65 y.o. male with medical history significant of chronic tracheostomy after prolonged hospitalization in 2021 related with pneumonia due to BWIOM-35 complicated by ARDS and pulmonary fibrosis +/-pneumonitis from brentuximab, s/p PEG, HTN, DM type II, Hodgkin's lymphoma on chemotherapy, and GERD presents with complaints of shortness of breath over the last 3 to 4 days.  At baseline patient reports that he is on 8 L of oxygen at home, but had been noticing that his oxygenation would drop into the 70s.  Patient reports that his trach cannula keeps getting stopped up with phlegm.  Associated symptoms include fever up to 100.4 F at home, malaise, and wheezing.  He had been eating regular foods unlawful in addition to his tube feeds, but he stopped eating food by mouth 3 days ago as he felt that it was worsening symptoms.  Hospital Course / Discharge diagnoses: Principal Problem Acute on chronic hypoxic respiratory failure in the setting of post-COVID ILD, hospital-acquired pneumonia versus chemotherapy induced pneumonitis (Brentuximab) -patient was initially admitted to the ICU given high oxygen requirements, he was placed on broad-spectrum antibiotics and completed 7 days of cefepime.  He was also placed on high-dose of steroids given concern for chemotherapy induced pneumonitis.  He was initially on vent but then eventually weaned back  off to trach collar.  He is now back to his home oxygenation of 8 L nasal cannula.  His IV steroids were tapered, currently he is on prednisone 40 mg daily which per pulmonology is to be continued until they will see him back in the office.  Active Problems Generalized failure to thrive in the setting of ARDS, COVID-19, trach dependent, severe malnutrition -continue PEG feed tubing Hypotension-patient's blood pressure in the outpatient setting was 597-416 systolic, he appears to be in the 90s now, he is asymptomatic with this.  Suspect related to his progressive failure to thrive/weight loss may be more hypotensive now.  Continue low-dose midodrine, blood pressure improved Hodgkin's lymphoma -Dr. Marin Olp followed patient while hospitalized, may need to think about changing chemotherapy but this can be done as an outpatient Anemia of chronic illness, underlying malignancy -Monitor CBC, no bleeding.  He was transfused a total of 2 units of packed red blood cells on 5/9 and hemoglobin has remained stable since. Type 2 diabetes mellitus -well-controlled, A1c 5.8  Sepsis ruled out   Discharge Instructions   Allergies as of 08/25/2020   No Known Allergies     Medication List    TAKE these medications   albuterol (2.5 MG/3ML) 0.083% nebulizer solution Commonly known as: PROVENTIL Take 3 mLs (2.5 mg total) by nebulization every 2 (two) hours as needed for wheezing.   calamine lotion Apply topically as needed for itching. What changed:   how much to take  when to take this   chlorhexidine 0.12 % solution Commonly known as: PERIDEX 15 mLs by Mouth Rinse route 2 (two) times daily. What changed:   when to take this  reasons  to take this   Chlorhexidine Gluconate Cloth 2 % Pads Apply 6 each topically daily.   feeding supplement (OSMOLITE 1.2 CAL) Liqd Place 1,000 mLs into feeding tube continuous. What changed: how much to take   feeding supplement (PRO-STAT SUGAR FREE 64)  Liqd Place 30 mLs into feeding tube daily.   furosemide 20 MG tablet Commonly known as: LASIX PLACE 1 TABLET (20 MG TOTAL) INTO FEEDING TUBE DAILY.   guaiFENesin 100 MG/5ML Soln Commonly known as: ROBITUSSIN Place 5 mLs (100 mg total) into feeding tube every 4 (four) hours as needed for cough or to loosen phlegm.   Hyosyne 0.125 MG/ML solution Generic drug: hyoscyamine Take 0.125 mg by mouth every 4 (four) hours as needed for bladder spasms or cramping.   lidocaine-prilocaine cream Commonly known as: EMLA Apply to affected area once What changed:   how much to take  how to take this  when to take this  reasons to take this   loperamide HCl 1 MG/7.5ML suspension Commonly known as: IMODIUM Place 15 mLs (2 mg total) into feeding tube as needed for diarrhea or loose stools (maximum 16mg  per day). What changed: when to take this   LORazepam 0.5 MG tablet Commonly known as: Ativan Take 1 tablet (0.5 mg total) by mouth every 6 (six) hours as needed (Nausea or vomiting).   magnesium oxide 400 MG tablet Commonly known as: MAG-OX Take 400 mg by mouth in the morning and at bedtime.   midodrine 5 MG tablet Commonly known as: PROAMATINE Take 1 tablet (5 mg total) by mouth 3 (three) times daily with meals.   multivitamin Liqd Place 15 mLs into feeding tube daily.   ondansetron 4 MG tablet Commonly known as: ZOFRAN Take 1 tablet (4 mg total) by mouth every 6 (six) hours as needed for nausea.   pantoprazole sodium 40 mg/20 mL Pack Commonly known as: PROTONIX Place 20 mLs (40 mg total) into feeding tube daily.   predniSONE 20 MG tablet Commonly known as: DELTASONE Take 2 tablets (40 mg total) by mouth daily with breakfast.   sertraline 50 MG tablet Commonly known as: ZOLOFT GIVE 1 TAB PER TUBE DAILY What changed: See the new instructions.   traZODone 100 MG tablet Commonly known as: DESYREL PLACE 1 TABLET (100 MG TOTAL) INTO FEEDING TUBE AT BEDTIME.   triamcinolone  cream 0.1 % Commonly known as: KENALOG Apply 1 application topically 2 (two) times daily as needed (itching).   Vitamin D3 10 MCG/ML Liqd Give 400 Units by tube daily. What changed: how to take this        Consultations:  PCCM  Oncology   Procedures/Studies:  DG Chest Port 1 View  Result Date: 08/13/2020 CLINICAL DATA:  Shortness of breath and productive cough for 3-4 days. EXAM: PORTABLE CHEST 1 VIEW COMPARISON:  Single-view of the chest 07/21/2020, 05/25/2020 and 04/07/2020. FINDINGS: Tracheostomy tube and Port-A-Cath are again seen. Coarse pulmonary opacities in the lung bases and the periphery of the chest bilaterally again seen. No definite new airspace disease. No pneumothorax or pleural effusion. Marked cardiomegaly. Bulky lymphadenopathy is better seen on the prior chest CT. IMPRESSION: Chronic lung disease without definite acute process. Cardiomegaly. Lymphadenopathy is seen on prior CT. Electronically Signed   By: Inge Rise M.D.   On: 08/13/2020 12:59   DG Chest Port 1V same Day  Result Date: 08/15/2020 CLINICAL DATA:  Shortness of breath EXAM: PORTABLE CHEST 1 VIEW COMPARISON:  08/13/2020 FINDINGS: Cardiac shadow is stable. Left chest  wall port and tracheostomy tube are again seen and stable. Chronic fibrotic changes are noted within both lungs. Elevation of the right hemidiaphragm is noted. Increasing opacity left apex is seen consistent with acute on chronic infiltrate. No other focal abnormality is noted. IMPRESSION: Increasing airspace opacity in the left apex consistent with acute on chronic infiltrate. Tubes and lines as described. Electronically Signed   By: Inez Catalina M.D.   On: 08/15/2020 15:35      Subjective: - no chest pain, shortness of breath, no abdominal pain, nausea or vomiting.   Discharge Exam: BP 102/68   Pulse 72   Temp 98 F (36.7 C) (Oral)   Resp 20   Ht 5\' 5"  (1.651 m)   Wt 52.6 kg   SpO2 97%   BMI 19.30 kg/m   General: Pt is  alert, awake, not in acute distress Cardiovascular: RRR, S1/S2 +, no rubs, no gallops Respiratory: CTA bilaterally, no wheezing, no rhonchi Abdominal: Soft, NT, ND, bowel sounds + Extremities: no edema, no cyanosis    The results of significant diagnostics from this hospitalization (including imaging, microbiology, ancillary and laboratory) are listed below for reference.     Microbiology: Recent Results (from the past 240 hour(s))  MRSA PCR Screening     Status: None   Collection Time: 08/16/20 11:35 AM   Specimen: Nasal Mucosa; Nasopharyngeal  Result Value Ref Range Status   MRSA by PCR NEGATIVE NEGATIVE Final    Comment:        The GeneXpert MRSA Assay (FDA approved for NASAL specimens only), is one component of a comprehensive MRSA colonization surveillance program. It is not intended to diagnose MRSA infection nor to guide or monitor treatment for MRSA infections. Performed at [facility], [place] [address], [place], [place] [address]      Labs: Basic Metabolic Panel: Recent Labs  Lab 08/18/20 1954 08/19/20 0400 08/20/20 0400 08/21/20 0302 08/22/20 0345 08/24/20 0510  NA  --  136 135 134* 135 138  K  --  4.2 4.2 4.4 4.3 3.8  CL  --  91* 95* 96* 95* 104  CO2  --  37* 36* 35* 35* 33*  GLUCOSE  --  155* 145* 158* 176* 94  BUN  --  28* 26* 29* 29* 25*  CREATININE  --  0.58* 0.55* 0.58* 0.55* 0.50*  CALCIUM  --  8.9 8.8* 8.6* 8.7* 7.8*  MG  --  2.2 2.2  --   --   --   PHOS 2.9 3.0 3.2  --   --   --    Liver Function Tests: Recent Labs  Lab 08/22/20 0345  AST 21  ALT 36  ALKPHOS 51  BILITOT 0.7  PROT 5.7*  ALBUMIN 2.5*   CBC: Recent Labs  Lab 08/19/20 0400 08/20/20 0400 08/21/20 0302 08/22/20 0345 08/24/20 0510  WBC 12.8* 12.4* 11.7* 10.0 9.5  NEUTROABS  --   --  9.2*  --   --   HGB 10.0* 10.5* 11.1* 11.2* 11.6*  HCT 32.4* 34.9* 36.5* 37.7* 38.4*  MCV 90.5 90.9 92.4 93.8 93.2  PLT 198 232 231 217 230   CBG: Recent Labs  Lab  08/24/20 1619 08/24/20 2003 08/24/20 2236 08/24/20 2356 08/25/20 0402  GLUCAP 227* 153* 174* 133* 104*   Hgb A1c No results for input(s): HGBA1C in the last 72 hours. Lipid Profile No results for input(s): CHOL, HDL, LDLCALC, TRIG, CHOLHDL, LDLDIRECT in the last 72 hours. Thyroid function studies No  results for input(s): TSH, T4TOTAL, T3FREE, THYROIDAB in the last 72 hours.  Invalid input(s): FREET3 Urinalysis    Component Value Date/Time   COLORURINE YELLOW 08/13/2020 [place] 08/13/2020 1615   LABSPEC 1.015 08/13/2020 1615   PHURINE 6.0 08/13/2020 1615   GLUCOSEU NEGATIVE 08/13/2020 1615   HGBUR NEGATIVE 08/13/2020 1615   HGBUR negative 03/23/2007 0000   BILIRUBINUR NEGATIVE 08/13/2020 1615   BILIRUBINUR neg 11/11/2016 1017   KETONESUR NEGATIVE 08/13/2020 1615   PROTEINUR NEGATIVE 08/13/2020 1615   UROBILINOGEN 0.2 11/11/2016 1017   UROBILINOGEN 0.2 11/08/2012 0915   NITRITE NEGATIVE 08/13/2020 1615   LEUKOCYTESUR SMALL (A) 08/13/2020 1615    FURTHER DISCHARGE INSTRUCTIONS:   Get Medicines reviewed and adjusted: Please take all your medications with you for your next visit with your Primary MD   Laboratory/radiological data: Please request your Primary MD to go over all hospital tests and procedure/radiological results at the follow up, please ask your Primary MD to get all Hospital records sent to his/her office.   In some cases, they will be blood work, cultures and biopsy results pending at the time of your discharge. Please request that your primary care M.D. goes through all the records of your hospital data and follows up on these results.   Also Note the following: If you experience worsening of your admission symptoms, develop shortness of breath, life threatening emergency, suicidal or homicidal thoughts you must seek medical attention immediately by calling 911 or calling your MD immediately  if symptoms less severe.   You must read  complete instructions/literature along with all the possible adverse reactions/side effects for all the Medicines you take and that have been prescribed to you. Take any new Medicines after you have completely understood and accpet all the possible adverse reactions/side effects.    Do not drive when taking Pain medications or sleeping medications (Benzodaizepines)   Do not take more than prescribed Pain, Sleep and Anxiety Medications. It is not advisable to combine anxiety,sleep and pain medications without talking with your primary care practitioner   Special Instructions: If you have smoked or chewed Tobacco  in the last 2 yrs please stop smoking, stop any regular Alcohol  and or any Recreational drug use.   Wear Seat belts while driving.   Please note: You were cared for by a hospitalist during your hospital stay. Once you are discharged, your primary care physician will handle any further medical issues. Please note that NO REFILLS for any discharge medications will be authorized once you are discharged, as it is imperative that you return to your primary care physician (or establish a relationship with a primary care physician if you do not have one) for your post hospital discharge needs so that they can reassess your need for medications and monitor your lab values.  Time coordinating discharge: 40 minutes  SIGNED:  Marzetta Board, MD, PhD 08/25/2020, 7:41 AM

## 2020-08-25 NOTE — Discharge Instructions (Signed)
Community-Acquired Pneumonia, Adult Pneumonia is an infection of the lungs. It causes irritation and swelling in the airways of the lungs. Mucus and fluid may also build up inside the airways. This may cause coughing and trouble breathing. One type of pneumonia can happen while you are in a hospital. A different type can happen when you are not in a hospital (community-acquired pneumonia). What are the causes? This condition is caused by germs (viruses, bacteria, or fungi). Some types of germs can spread from person to person. Pneumonia is not thought to spread from person to person.   What increases the risk? You are more likely to develop this condition if:  You have a long-term (chronic) disease, such as: ? Disease of the lungs. This may be chronic obstructive pulmonary disease (COPD) or asthma. ? Heart failure. ? Cystic fibrosis. ? Diabetes. ? Kidney disease. ? Sickle cell disease. ? HIV.  You have other health problems, such as: ? Your body's defense system (immune system) is weak. ? A condition that may cause you to breathe in fluids from your mouth and nose.  You had your spleen taken out.  You do not take good care of your teeth and mouth (poor dental hygiene).  You use or have used tobacco products.  You travel where the germs that cause this illness are common.  You are near certain animals or the places they live.  You are older than 65 years of age. What are the signs or symptoms? Symptoms of this condition include:  A cough.  A fever.  Sweating or chills.  Chest pain, often when you breathe deeply or cough.  Breathing problems, such as: ? Fast breathing. ? Trouble breathing. ? Shortness of breath.  Feeling tired (fatigued).  Muscle aches. How is this treated? Treatment for this condition depends on many things, such as:  The cause of your illness.  Your medicines.  Your other health problems. Most adults can be treated at home. Sometimes,  treatment must happen in a hospital.  Treatment may include medicines to kill germs.  Medicines may depend on which germ caused your illness. Very bad pneumonia is rare. If you get it, you may:  Have a machine to help you breathe.  Have fluid taken away from around your lungs. Follow these instructions at home: Medicines  Take over-the-counter and prescription medicines only as told by your doctor.  Take cough medicine only if you are losing sleep. Cough medicine can keep your body from taking mucus away from your lungs.  If you were prescribed an antibiotic medicine, take it as told by your doctor. Do not stop taking the antibiotic even if you start to feel better. Lifestyle  Do not drink alcohol.  Do not use any products that contain nicotine or tobacco, such as cigarettes, e-cigarettes, and chewing tobacco. If you need help quitting, ask your doctor.  Eat a healthy diet. This includes a lot of vegetables, fruits, whole grains, low-fat dairy products, and low-fat (lean) protein.      General instructions  Rest a lot. Sleep for at least 8 hours each night.  Sleep with your head and neck raised. Put a few pillows under your head or sleep in a reclining chair.  Return to your normal activities as told by your doctor. Ask your doctor what activities are safe for you.  Drink enough fluid to keep your pee (urine) pale yellow.  If your throat is sore, rinse your mouth often with salt water. To make salt   water, dissolve -1 tsp (3-6 g) of salt in 1 cup (237 mL) of warm water.  Keep all follow-up visits as told by your doctor. This is important.   How is this prevented? You can lower your risk of pneumonia by:  Getting the pneumonia shot (vaccine). These shots have different types and schedules. Ask your doctor what works best for you. Think about getting this shot if: ? You are older than 65 years of age. ? You are 19-65 years of age and:  You are being treated for  cancer.  You have long-term lung disease.  You have other problems that affect your body's defense system. Ask your doctor if you have one of these.  Getting your flu shot every year. Ask your doctor which type of shot is best for you.  Going to the dentist as often as told.  Washing your hands often with soap and water for at least 20 seconds. If you cannot use soap and water, use hand sanitizer. Contact a doctor if:  You have a fever.  You lose sleep because your cough medicine does not help. Get help right away if:  You are short of breath and this gets worse.  You have more chest pain.  Your sickness gets worse. This is very serious if: ? You are an older adult. ? Your body's defense system is weak.  You cough up blood. These symptoms may be an emergency. Do not wait to see if the symptoms will go away. Get medical help right away. Call your local emergency services (911 in the [place] Do not drive yourself to the hospital. Summary  Pneumonia is an infection of the lungs.  Community-acquired pneumonia affects people who have not been in the hospital. Certain germs can cause this infection.  This condition may be treated with medicines that kill germs.  For very bad pneumonia, you may need a hospital stay and treatment to help with breathing. This information is not intended to replace advice given to you by your health care provider. Make sure you discuss any questions you have with your health care provider. Document Revised: 01/08/2019 Document Reviewed: 01/08/2019 Elsevier Patient Education  2021 [employer] Inc.  

## 2020-08-25 NOTE — Procedures (Signed)
Tracheostomy Change Note  Patient Details:   Name: Steve [name] DOB: May 09, 1955 MRN: [id number]    Airway Documentation:     Evaluation  O2 sats: stable throughout Complications: No apparent complications Patient did tolerate procedure well. Bilateral Breath Sounds: Clear,Diminished  Changed Trach to #6 Cuffless  Good color change on ETCO2 BBS equal   Gonzella Lex 08/25/2020, 10:26 AM

## 2020-08-26 ENCOUNTER — Inpatient Hospital Stay: Payer: BC Managed Care – PPO

## 2020-08-26 ENCOUNTER — Ambulatory Visit: Payer: BC Managed Care – PPO

## 2020-08-26 ENCOUNTER — Inpatient Hospital Stay: Payer: BC Managed Care – PPO | Admitting: Hematology & Oncology

## 2020-08-26 ENCOUNTER — Telehealth: Payer: Self-pay | Admitting: Emergency Medicine

## 2020-08-26 ENCOUNTER — Encounter: Payer: Self-pay | Admitting: *Deleted

## 2020-08-26 ENCOUNTER — Other Ambulatory Visit: Payer: Self-pay | Admitting: Medical

## 2020-08-26 NOTE — Telephone Encounter (Signed)
Steve Harold, NP  P [facility] Triage Pool Patient will need a hospital follow up for the first part of July with Dr. [name] or APP.   Thank You   Eddie Dibbles    Dr. [name] does not have open schedule yet for July. Will keep encounter open for when Dr.Byrum's July schedule opens.

## 2020-08-26 NOTE — Progress Notes (Signed)
Patient recently discharged from the hospital. Appointment originally scheduled for today requires rescheduling.   Oncology Nurse Navigator Documentation  Oncology Nurse Navigator Flowsheets 08/26/2020  Abnormal Finding Date -  Confirmed Diagnosis Date -  Diagnosis Status -  Planned Course of Treatment -  Phase of Treatment -  Chemotherapy Pending- Reason: -  Chemotherapy Actual Start Date: -  Navigator Follow Up Date: 09/08/2020  Navigator Follow Up Reason: Follow-up Appointment;Chemotherapy  Navigator Location [facility]  Referral Date to RadOnc/MedOnc -  Navigator Encounter Type Appt/Treatment Plan Review  Telephone -  Treatment Initiated Date -  Patient Visit Type MedOnc  Treatment Phase Active Tx  Barriers/Navigation Needs Coordination of Care;Education;Employed;Family Concerns;Morbidities/Frailty;Multiple Hospital Admissions  Education -  Interventions None Required  Acuity Level 4-High Needs (Greater Than 4 Barriers Identified)  Referrals -  Coordination of Care -  Education Method -  Support Groups/Services Friends and Family  Time Spent with Patient 15

## 2020-08-28 ENCOUNTER — Telehealth: Payer: Self-pay | Admitting: Medical

## 2020-08-28 NOTE — Telephone Encounter (Signed)
Caller: Claiborne Billings [facility]) Call back # [phone]  Verbal order - ok to leave voice mail  PT  1 time a week for 1 week 2 times a week for 4 weeks

## 2020-08-28 NOTE — Telephone Encounter (Signed)
VO given.

## 2020-08-28 NOTE — Telephone Encounter (Signed)
Verbal order ok  

## 2020-08-28 NOTE — Telephone Encounter (Signed)
I called and spoke with the pt's spouse, [name] and scheduled appt for 10/29/20 at 9 am. Advised do not hesitate to call sooner if needed.

## 2020-09-08 ENCOUNTER — Inpatient Hospital Stay (HOSPITAL_BASED_OUTPATIENT_CLINIC_OR_DEPARTMENT_OTHER): Payer: BC Managed Care – PPO | Admitting: Hematology & Oncology

## 2020-09-08 ENCOUNTER — Inpatient Hospital Stay: Payer: BC Managed Care – PPO

## 2020-09-08 ENCOUNTER — Other Ambulatory Visit: Payer: Self-pay

## 2020-09-08 ENCOUNTER — Encounter: Payer: Self-pay | Admitting: Hematology & Oncology

## 2020-09-08 ENCOUNTER — Telehealth: Payer: Self-pay

## 2020-09-08 ENCOUNTER — Inpatient Hospital Stay: Payer: BC Managed Care – PPO | Attending: Hematology & Oncology

## 2020-09-08 ENCOUNTER — Encounter: Payer: Self-pay | Admitting: *Deleted

## 2020-09-08 VITALS — BP 107/61 | HR 72 | Temp 98.5°F | Resp 18

## 2020-09-08 VITALS — Wt 142.0 lb

## 2020-09-08 DIAGNOSIS — R61 Generalized hyperhidrosis: Secondary | ICD-10-CM | POA: Insufficient documentation

## 2020-09-08 DIAGNOSIS — R635 Abnormal weight gain: Secondary | ICD-10-CM | POA: Insufficient documentation

## 2020-09-08 DIAGNOSIS — M7989 Other specified soft tissue disorders: Secondary | ICD-10-CM | POA: Insufficient documentation

## 2020-09-08 DIAGNOSIS — R110 Nausea: Secondary | ICD-10-CM | POA: Diagnosis not present

## 2020-09-08 DIAGNOSIS — R0789 Other chest pain: Secondary | ICD-10-CM | POA: Diagnosis not present

## 2020-09-08 DIAGNOSIS — Z95828 Presence of other vascular implants and grafts: Secondary | ICD-10-CM

## 2020-09-08 DIAGNOSIS — C8102 Nodular lymphocyte predominant Hodgkin lymphoma, intrathoracic lymph nodes: Secondary | ICD-10-CM

## 2020-09-08 DIAGNOSIS — Z79899 Other long term (current) drug therapy: Secondary | ICD-10-CM | POA: Diagnosis not present

## 2020-09-08 DIAGNOSIS — C8104 Nodular lymphocyte predominant Hodgkin lymphoma, lymph nodes of axilla and upper limb: Secondary | ICD-10-CM

## 2020-09-08 DIAGNOSIS — R062 Wheezing: Secondary | ICD-10-CM | POA: Insufficient documentation

## 2020-09-08 DIAGNOSIS — C8110 Nodular sclerosis classical Hodgkin lymphoma, unspecified site: Secondary | ICD-10-CM | POA: Insufficient documentation

## 2020-09-08 DIAGNOSIS — R5383 Other fatigue: Secondary | ICD-10-CM | POA: Insufficient documentation

## 2020-09-08 DIAGNOSIS — R0602 Shortness of breath: Secondary | ICD-10-CM | POA: Diagnosis not present

## 2020-09-08 DIAGNOSIS — T451X5A Adverse effect of antineoplastic and immunosuppressive drugs, initial encounter: Secondary | ICD-10-CM

## 2020-09-08 LAB — CBC WITH DIFFERENTIAL (CANCER CENTER ONLY)
Abs Immature Granulocytes: 0.11 10*3/uL — ABNORMAL HIGH (ref 0.00–0.07)
Basophils Absolute: 0 10*3/uL (ref 0.0–0.1)
Basophils Relative: 0 %
Eosinophils Absolute: 0.2 10*3/uL (ref 0.0–0.5)
Eosinophils Relative: 1 %
HCT: 36.6 % — ABNORMAL LOW (ref 39.0–52.0)
Hemoglobin: 11.3 g/dL — ABNORMAL LOW (ref 13.0–17.0)
Immature Granulocytes: 1 %
Lymphocytes Relative: 13 %
Lymphs Abs: 1.8 10*3/uL (ref 0.7–4.0)
MCH: 28.3 pg (ref 26.0–34.0)
MCHC: 30.9 g/dL (ref 30.0–36.0)
MCV: 91.7 fL (ref 80.0–100.0)
Monocytes Absolute: 1.7 10*3/uL — ABNORMAL HIGH (ref 0.1–1.0)
Monocytes Relative: 12 %
Neutro Abs: 10.4 10*3/uL — ABNORMAL HIGH (ref 1.7–7.7)
Neutrophils Relative %: 73 %
Platelet Count: 168 10*3/uL (ref 150–400)
RBC: 3.99 MIL/uL — ABNORMAL LOW (ref 4.22–5.81)
RDW: 18.3 % — ABNORMAL HIGH (ref 11.5–15.5)
WBC Count: 14.3 10*3/uL — ABNORMAL HIGH (ref 4.0–10.5)
nRBC: 0 % (ref 0.0–0.2)

## 2020-09-08 LAB — CMP (CANCER CENTER ONLY)
ALT: 230 U/L — ABNORMAL HIGH (ref 0–44)
AST: 77 U/L — ABNORMAL HIGH (ref 15–41)
Albumin: 3.2 g/dL — ABNORMAL LOW (ref 3.5–5.0)
Alkaline Phosphatase: 219 U/L — ABNORMAL HIGH (ref 38–126)
Anion gap: 5 (ref 5–15)
BUN: 21 mg/dL (ref 8–23)
CO2: 40 mmol/L — ABNORMAL HIGH (ref 22–32)
Calcium: 9.5 mg/dL (ref 8.9–10.3)
Chloride: 82 mmol/L — ABNORMAL LOW (ref 98–111)
Creatinine: 0.39 mg/dL — ABNORMAL LOW (ref 0.61–1.24)
GFR, Estimated: >60 mL/min (ref >60)
Glucose, Bld: 129 mg/dL — ABNORMAL HIGH (ref 70–99)
Potassium: 4.5 mmol/L (ref 3.5–5.1)
Sodium: 127 mmol/L — ABNORMAL LOW (ref 135–145)
Total Bilirubin: 1.2 mg/dL (ref 0.3–1.2)
Total Protein: 6.2 g/dL — ABNORMAL LOW (ref 6.5–8.1)

## 2020-09-08 LAB — LACTATE DEHYDROGENASE: LDH: 228 U/L — ABNORMAL HIGH (ref 98–192)

## 2020-09-08 MED ORDER — HEPARIN SOD (PORK) LOCK FLUSH 100 UNIT/ML IV SOLN
500.0000 [IU] | Freq: Once | INTRAVENOUS | Status: AC
  Administered 2020-09-08: 500 [IU] via INTRAVENOUS
  Filled 2020-09-08: qty 5

## 2020-09-08 MED ORDER — SODIUM CHLORIDE 0.9% FLUSH
10.0000 mL | Freq: Once | INTRAVENOUS | Status: AC
Start: 2020-09-08 — End: 2020-09-08
  Administered 2020-09-08: 10 mL via INTRAVENOUS
  Filled 2020-09-08: qty 10

## 2020-09-08 NOTE — Progress Notes (Signed)
Hematology and Oncology Follow Up Visit  Steve [name] [id number] 1955/05/10 65 y.o. 09/08/2020   Principle Diagnosis:   Relapsed classical Hodgkin's disease  Current Therapy:    Adcetris-s/p cycle 2 - started on 07/10/2020     Interim History:  Steve [name] is back to the office.  Unfortunately, he was back in the hospital again.  He had a second dose of Adcetris on 08/05/2020.  He ended up in the hospital on 08/13/2020.  He had pneumonia.  , Sure if the Adcetris has something to do with him having pneumonia.  His wife says that he is has more phlegm now.  He is having more secretions.  This happens to him whenever he starts to have pulmonary issues.  The 1 issue that we have right now is that his liver function studies are elevated.  I am not sure as to why they are elevated.  As such, I really cannot treat him with Adcetris.  When he was in the hospital, I thought that we might have to drop the Adcetris and maybe even think about actual chemotherapy.  I am really would hate to have him go down this road.  His wife is eating quite well.  He is gained weight.  He does have some feedings and through a feeding tube.  He has had no bleeding.  There may be a little bit of blood-tinged sputum.  Currently, I would have to say his performance status is by ECOG 2.     Medications:  Current Outpatient Medications:  .  albuterol (PROVENTIL) (2.5 MG/3ML) 0.083% nebulizer solution, Take 3 mLs (2.5 mg total) by nebulization every 2 (two) hours as needed for wheezing., Disp: 75 mL, Rfl: 6 .  Amino Acids-Protein Hydrolys (FEEDING SUPPLEMENT, PRO-STAT SUGAR FREE 64,) LIQD, Place 30 mLs into feeding tube daily., Disp: , Rfl:  .  calamine lotion, Apply topically as needed for itching. (Patient taking differently: Apply 1 application topically daily as needed for itching.), Disp: 120 mL, Rfl: 0 .  chlorhexidine (PERIDEX) 0.12 % solution, 15 mLs by Mouth Rinse route 2 (two) times daily. (Patient  taking differently: 15 mLs by Mouth Rinse route 2 (two) times daily as needed (mouth pain).), Disp: 120 mL, Rfl: 0 .  Chlorhexidine Gluconate Cloth 2 % PADS, Apply 6 each topically daily., Disp: , Rfl:  .  Cholecalciferol (VITAMIN D3) 10 MCG/ML LIQD, Give 400 Units by tube daily. (Patient taking differently: Place 400 Units into feeding tube daily.), Disp: 120 mL, Rfl:  .  furosemide (LASIX) 20 MG tablet, PLACE 1 TABLET (20 MG TOTAL) INTO FEEDING TUBE DAILY., Disp: 30 tablet, Rfl: 1 .  guaiFENesin (ROBITUSSIN) 100 MG/5ML SOLN, Place 5 mLs (100 mg total) into feeding tube every 4 (four) hours as needed for cough or to loosen phlegm., Disp: 236 mL, Rfl: 0 .  HYOSYNE 0.125 MG/ML solution, Take 0.125 mg by mouth every 4 (four) hours as needed for bladder spasms or cramping., Disp: , Rfl:  .  lidocaine-prilocaine (EMLA) cream, Apply to affected area once (Patient taking differently: Apply 1 application topically daily as needed (access port). Apply to affected area once), Disp: 30 g, Rfl: 3 .  loperamide HCl (IMODIUM) 1 MG/7.5ML suspension, Place 15 mLs (2 mg total) into feeding tube as needed for diarrhea or loose stools (maximum 16mg  per day). (Patient taking differently: Place 2 mg into feeding tube daily as needed for diarrhea or loose stools (maximum 16mg  per day).), Disp: 120 mL, Rfl: 0 .  LORazepam (ATIVAN) 0.5 MG tablet, Take 1 tablet (0.5 mg total) by mouth every 6 (six) hours as needed (Nausea or vomiting)., Disp: 30 tablet, Rfl: 0 .  magnesium oxide (MAG-OX) 400 MG tablet, Take 400 mg by mouth in the morning and at bedtime., Disp: , Rfl:  .  midodrine (PROAMATINE) 5 MG tablet, Take 1 tablet (5 mg total) by mouth 3 (three) times daily with meals., Disp: 90 tablet, Rfl: 0 .  Multiple Vitamin (MULTIVITAMIN) LIQD, Place 15 mLs into feeding tube daily., Disp:  , Rfl:  .  Nutritional Supplements (FEEDING SUPPLEMENT, OSMOLITE 1.2 CAL,) LIQD, Place 1,000 mLs into feeding tube continuous. (Patient taking  differently: Place 70 mLs into feeding tube continuous.), Disp: , Rfl: 0 .  ondansetron (ZOFRAN) 4 MG tablet, Take 1 tablet (4 mg total) by mouth every 6 (six) hours as needed for nausea., Disp: 20 tablet, Rfl: 0 .  pantoprazole sodium (PROTONIX) 40 mg/20 mL PACK, Place 20 mLs (40 mg total) into feeding tube daily., Disp: 600 mL, Rfl: 0 .  predniSONE (DELTASONE) 20 MG tablet, Take 2 tablets (40 mg total) by mouth daily with breakfast., Disp: 30 tablet, Rfl: 1 .  sertraline (ZOLOFT) 50 MG tablet, GIVE 1 TAB PER TUBE DAILY (Patient taking differently: Take 50 mg by mouth daily.), Disp: 90 tablet, Rfl: 1 .  traZODone (DESYREL) 100 MG tablet, PLACE 1 TABLET (100 MG TOTAL) INTO FEEDING TUBE AT BEDTIME., Disp: 90 tablet, Rfl: 2 .  triamcinolone (KENALOG) 0.1 %, Apply 1 application topically 2 (two) times daily as needed (itching)., Disp: , Rfl:   Allergies: No Known Allergies  Past Medical History, Surgical history, Social history, and Family History were reviewed and updated.  Review of Systems: Review of Systems  Constitutional: Positive for diaphoresis and fatigue.  HENT:  Negative.   Eyes: Negative.   Respiratory: Positive for chest tightness, shortness of breath and wheezing.   Cardiovascular: Positive for leg swelling.  Gastrointestinal: Positive for nausea.  Endocrine: Negative.   Genitourinary: Negative.    Musculoskeletal: Negative.   Skin: Negative.   Neurological: Negative.   Hematological: Negative.   Psychiatric/Behavioral: Negative.     Physical Exam:  weight is 142 lb (64.4 kg).   Wt Readings from Last 3 Encounters:  09/08/20 142 lb (64.4 kg)  08/21/20 115 lb 15.4 oz (52.6 kg)  07/28/20 135 lb 2.3 oz (61.3 kg)    Physical Exam Vitals reviewed.  HENT:     Head: Normocephalic and atraumatic.  Eyes:     Pupils: Pupils are equal, round, and reactive to light.  Neck:     Comments: His neck exam shows no adenopathy bilaterally.  He does have the tracheotomy  intact. Cardiovascular:     Rate and Rhythm: Normal rate and regular rhythm.     Heart sounds: Normal heart sounds.     Comments: Cardiac exam shows a regular rate and rhythm.  He has a normal S1 and S2.  There are no obvious murmurs. Pulmonary:     Effort: Pulmonary effort is normal.     Breath sounds: Normal breath sounds.     Comments: His lungs show congestion bilaterally.  He has some wheezes bilaterally.  He has decreased air movement bilaterally. Abdominal:     General: Bowel sounds are normal.     Palpations: Abdomen is soft.  Musculoskeletal:        General: No tenderness or deformity. Normal range of motion.     Cervical back: Normal range of motion.  Comments: Extremities does show some mild pitting edema in his legs bilaterally.  Lymphadenopathy:     Cervical: No cervical adenopathy.  Skin:    General: Skin is warm and dry.     Findings: No erythema or rash.  Neurological:     Mental Status: He is alert and oriented to person, place, and time.  Psychiatric:        Behavior: Behavior normal.        Thought Content: Thought content normal.        Judgment: Judgment normal.      Lab Results  Component Value Date   WBC 14.3 (H) 09/08/2020   HGB 11.3 (L) 09/08/2020   HCT 36.6 (L) 09/08/2020   MCV 91.7 09/08/2020   PLT 168 09/08/2020     Chemistry      Component Value Date/Time   NA 127 (L) 09/08/2020 1140   K 4.5 09/08/2020 1140   CL 82 (L) 09/08/2020 1140   CO2 40 (H) 09/08/2020 1140   BUN 21 09/08/2020 1140   CREATININE 0.39 (L) 09/08/2020 1140      Component Value Date/Time   CALCIUM 9.5 09/08/2020 1140   ALKPHOS 219 (H) 09/08/2020 1140   AST 77 (H) 09/08/2020 1140   ALT 230 (H) 09/08/2020 1140   BILITOT 1.2 09/08/2020 1140      Impression and Plan: Steve [name] is a very nice 65 year old white male.  He has relapsed Hodgkin's disease.  He did not receive his full complement of chemotherapy initially.  This was because of medical issues not  related to his Hodgkin's disease.  For right now, will have to hold on chemotherapy for now.  I would not do Adcetris.  Again I just worry about the Adcetris causing problems for him.  We will see about getting a ultrasound of his liver this week.  I think this would be helpful.  I think we should give him 2 weeks off.  We will see how he feels.  I do not want to totally give up on the Adcetris as this is the easiest way to try to treat him.  Going with chemotherapy would certainly have its downside.  Again we want to make sure that his quality of life is a priority.   Volanda Napoleon, MD 5/31/20221:06 PM

## 2020-09-08 NOTE — Progress Notes (Signed)
Oncology Nurse Navigator Documentation  Oncology Nurse Navigator Flowsheets 09/08/2020  Abnormal Finding Date -  Confirmed Diagnosis Date -  Diagnosis Status -  Planned Course of Treatment -  Phase of Treatment -  Chemotherapy Pending- Reason: -  Chemotherapy Actual Start Date: -  Navigator Follow Up Date: 09/17/2020  Navigator Follow Up Reason: Scan Review  Navigator Location [facility]  Referral Date to RadOnc/MedOnc -  Navigator Encounter Type Appt/Treatment Plan Review  Telephone -  Treatment Initiated Date -  Patient Visit Type MedOnc  Treatment Phase Active Tx  Barriers/Navigation Needs Coordination of Care;Education;Employed;Family Concerns;Morbidities/Frailty;Multiple Hospital Admissions  Education -  Interventions None Required  Acuity Level 4-High Needs (Greater Than 4 Barriers Identified)  Referrals -  Coordination of Care -  Education Method -  Support Groups/Services Friends and Family  Time Spent with Patient 30

## 2020-09-08 NOTE — Telephone Encounter (Signed)
Called pts wife with appts per 09/08/20 los and she is aware, also she mentioned that they went ahead to imaging and scheduled the u/s for next week per pt req    Cassey Hurrell

## 2020-09-08 NOTE — Patient Instructions (Signed)
Implanted Port Insertion, Care After This sheet gives you information about how to care for yourself after your procedure. Your health care provider may also give you more specific instructions. If you have problems or questions, contact your health care provider. What can I expect after the procedure? After the procedure, it is common to have:  Discomfort at the port insertion site.  Bruising on the skin over the port. This should improve over 3-4 days. Follow these instructions at home: Port care  After your port is placed, you will get a manufacturer's information card. The card has information about your port. Keep this card with you at all times.  Take care of the port as told by your health care provider. Ask your health care provider if you or a family member can get training for taking care of the port at home. A home health care nurse may also take care of the port.  Make sure to remember what type of port you have. Incision care  Follow instructions from your health care provider about how to take care of your port insertion site. Make sure you: ? Wash your hands with soap and water before and after you change your bandage (dressing). If soap and water are not available, use hand sanitizer. ? Change your dressing as told by your health care provider. ? Leave stitches (sutures), skin glue, or adhesive strips in place. These skin closures may need to stay in place for 2 weeks or longer. If adhesive strip edges start to loosen and curl up, you may trim the loose edges. Do not remove adhesive strips completely unless your health care provider tells you to do that.  Check your port insertion site every day for signs of infection. Check for: ? Redness, swelling, or pain. ? Fluid or blood. ? Warmth. ? Pus or a bad smell.      Activity  Return to your normal activities as told by your health care provider. Ask your health care provider what activities are safe for you.  Do not  lift anything that is heavier than 10 lb (4.5 kg), or the limit that you are told, until your health care provider says that it is safe. General instructions  Take over-the-counter and prescription medicines only as told by your health care provider.  Do not take baths, swim, or use a hot tub until your health care provider approves. Ask your health care provider if you may take showers. You may only be allowed to take sponge baths.  Do not drive for 24 hours if you were given a sedative during your procedure.  Wear a medical alert bracelet in case of an emergency. This will tell any health care providers that you have a port.  Keep all follow-up visits as told by your health care provider. This is important. Contact a health care provider if:  You cannot flush your port with saline as directed, or you cannot draw blood from the port.  You have a fever or chills.  You have redness, swelling, or pain around your port insertion site.  You have fluid or blood coming from your port insertion site.  Your port insertion site feels warm to the touch.  You have pus or a bad smell coming from the port insertion site. Get help right away if:  You have chest pain or shortness of breath.  You have bleeding from your port that you cannot control. Summary  Take care of the port as told by your   health care provider. Keep the manufacturer's information card with you at all times.  Change your dressing as told by your health care provider.  Contact a health care provider if you have a fever or chills or if you have redness, swelling, or pain around your port insertion site.  Keep all follow-up visits as told by your health care provider. This information is not intended to replace advice given to you by your health care provider. Make sure you discuss any questions you have with your health care provider. Document Revised: 10/24/2017 Document Reviewed: 10/24/2017 Elsevier Patient Education   2021 [employer]  

## 2020-09-11 ENCOUNTER — Inpatient Hospital Stay (HOSPITAL_COMMUNITY): Admit: 2020-09-11 | Payer: BC Managed Care – PPO

## 2020-09-17 ENCOUNTER — Encounter: Payer: Self-pay | Admitting: *Deleted

## 2020-09-17 ENCOUNTER — Ambulatory Visit (HOSPITAL_BASED_OUTPATIENT_CLINIC_OR_DEPARTMENT_OTHER): Admission: RE | Admit: 2020-09-17 | Payer: BC Managed Care – PPO | Source: Ambulatory Visit

## 2020-09-17 NOTE — Progress Notes (Signed)
Patient was a no-show to his [place] appointment. Called and spoke to wife to check on patient status. She states that patient had PT this morning and she just forgot that patient had the [place] scheduled. Patient is otherwise at baseline. Patient has a follow up appointment with a different physician in the office tomorrow and wonders if the [place] can be done then.  Message send to radiology asking them to call patient's wife to reschedule missed appointment. They don't have an appointment available tomorrow, but will reach out to see what works with her schedule.   Oncology Nurse Navigator Documentation  Oncology Nurse Navigator Flowsheets 09/17/2020  Abnormal Finding Date -  Confirmed Diagnosis Date -  Diagnosis Status -  Planned Course of Treatment -  Phase of Treatment -  Chemotherapy Pending- Reason: -  Chemotherapy Actual Start Date: -  Navigator Follow Up Date: 09/25/2020  Navigator Follow Up Reason: Follow-up Appointment  Navigator Location [facility]  Referral Date to RadOnc/MedOnc -  Navigator Encounter Type Scan Review;Telephone  Telephone Patient Update;Outgoing Call  Treatment Initiated Date -  Patient Visit Type MedOnc  Treatment Phase Active Tx  Barriers/Navigation Needs Coordination of Care;Education;Employed;Family Concerns;Morbidities/Frailty;Multiple Hospital Admissions  Education Other  Interventions Coordination of Care;Psycho-Social Support  Acuity Level 4-High Needs (Greater Than 4 Barriers Identified)  Referrals -  Coordination of Care Radiology  Education Method Verbal  Support Groups/Services Friends and Family  Time Spent with Patient 30

## 2020-09-18 ENCOUNTER — Other Ambulatory Visit: Payer: Self-pay

## 2020-09-18 ENCOUNTER — Telehealth: Payer: Self-pay

## 2020-09-18 ENCOUNTER — Ambulatory Visit: Payer: BC Managed Care – PPO | Admitting: Medical

## 2020-09-18 ENCOUNTER — Ambulatory Visit (HOSPITAL_BASED_OUTPATIENT_CLINIC_OR_DEPARTMENT_OTHER)
Admission: RE | Admit: 2020-09-18 | Discharge: 2020-09-18 | Disposition: A | Discharge status: Home / Self Care | Payer: BC Managed Care – PPO | Source: Ambulatory Visit | Attending: Medical | Admitting: Medical

## 2020-09-18 VITALS — BP 102/55 | HR 108 | Resp 18 | Ht 65.0 in | Wt 142.0 lb

## 2020-09-18 DIAGNOSIS — A4102 Sepsis due to Methicillin resistant Staphylococcus aureus: Secondary | ICD-10-CM | POA: Diagnosis not present

## 2020-09-18 DIAGNOSIS — E875 Hyperkalemia: Secondary | ICD-10-CM | POA: Diagnosis not present

## 2020-09-18 DIAGNOSIS — R5383 Other fatigue: Secondary | ICD-10-CM

## 2020-09-18 DIAGNOSIS — E119 Type 2 diabetes mellitus without complications: Secondary | ICD-10-CM | POA: Diagnosis not present

## 2020-09-18 DIAGNOSIS — I10 Essential (primary) hypertension: Secondary | ICD-10-CM | POA: Diagnosis not present

## 2020-09-18 DIAGNOSIS — D72829 Elevated white blood cell count, unspecified: Secondary | ICD-10-CM | POA: Diagnosis not present

## 2020-09-18 DIAGNOSIS — D6481 Anemia due to antineoplastic chemotherapy: Secondary | ICD-10-CM

## 2020-09-18 DIAGNOSIS — J8410 Pulmonary fibrosis, unspecified: Secondary | ICD-10-CM

## 2020-09-18 DIAGNOSIS — J9621 Acute and chronic respiratory failure with hypoxia: Secondary | ICD-10-CM | POA: Diagnosis not present

## 2020-09-18 DIAGNOSIS — T451X5A Adverse effect of antineoplastic and immunosuppressive drugs, initial encounter: Secondary | ICD-10-CM

## 2020-09-18 DIAGNOSIS — J189 Pneumonia, unspecified organism: Secondary | ICD-10-CM | POA: Insufficient documentation

## 2020-09-18 LAB — COMPREHENSIVE METABOLIC PANEL
ALT: 34 U/L (ref 0–53)
AST: 17 U/L (ref 0–37)
Albumin: 3 g/dL — ABNORMAL LOW (ref 3.5–5.2)
Alkaline Phosphatase: 113 U/L (ref 39–117)
BUN: 24 mg/dL — ABNORMAL HIGH (ref 6–23)
CO2: 49 mEq/L — ABNORMAL HIGH (ref 19–32)
Calcium: 9.9 mg/dL (ref 8.4–10.5)
Chloride: 82 mEq/L — ABNORMAL LOW (ref 96–112)
Creatinine, Ser: 0.53 mg/dL (ref 0.40–1.50)
GFR: 105.59 mL/min (ref >60.00)
Glucose, Bld: 391 mg/dL — ABNORMAL HIGH (ref 70–99)
Potassium: 5.9 mEq/L — ABNORMAL HIGH (ref 3.5–5.1)
Sodium: 135 mEq/L (ref 135–145)
Total Bilirubin: 0.6 mg/dL (ref 0.2–1.2)
Total Protein: 6.5 g/dL (ref 6.0–8.3)

## 2020-09-18 LAB — CBC WITH DIFFERENTIAL/PLATELET
Basophils Absolute: 0 10*3/uL (ref 0.0–0.1)
Basophils Relative: 0 % (ref 0.0–3.0)
Eosinophils Absolute: 0 10*3/uL (ref 0.0–0.7)
Eosinophils Relative: 0.1 % (ref 0.0–5.0)
HCT: 33.9 % — ABNORMAL LOW (ref 39.0–52.0)
Hemoglobin: 10.8 g/dL — ABNORMAL LOW (ref 13.0–17.0)
Lymphocytes Relative: 3.7 % — ABNORMAL LOW (ref 12.0–46.0)
Lymphs Abs: 0.7 10*3/uL (ref 0.7–4.0)
MCHC: 31.9 g/dL (ref 30.0–36.0)
MCV: 90.6 fl (ref 78.0–100.0)
Monocytes Absolute: 1.4 10*3/uL — ABNORMAL HIGH (ref 0.1–1.0)
Monocytes Relative: 7.7 % (ref 3.0–12.0)
Neutro Abs: 16.5 10*3/uL — ABNORMAL HIGH (ref 1.4–7.7)
Neutrophils Relative %: 88.5 % — ABNORMAL HIGH (ref 43.0–77.0)
Platelets: 384 10*3/uL (ref 150.0–400.0)
RBC: 3.74 Mil/uL — ABNORMAL LOW (ref 4.22–5.81)
RDW: 18.9 % — ABNORMAL HIGH (ref 11.5–15.5)
WBC: 18.6 10*3/uL (ref 4.0–10.5)

## 2020-09-18 LAB — HEMOGLOBIN A1C: Hgb A1c MFr Bld: 7.2 % — ABNORMAL HIGH (ref 4.6–6.5)

## 2020-09-18 LAB — VITAMIN B12: Vitamin B-12: 729 pg/mL (ref 211–911)

## 2020-09-18 LAB — IRON: Iron: 19 ug/dL — ABNORMAL LOW (ref 42–165)

## 2020-09-18 LAB — TSH: TSH: 2.85 u[IU]/mL (ref 0.35–4.50)

## 2020-09-18 NOTE — Telephone Encounter (Signed)
CRITICAL VALUE STICKER  CRITICAL VALUE: WBC 18.6  RECEIVER (on-site recipient of call): Juanpablo Ciresi  DATE & TIME NOTIFIED: 09/18/2020 at 4:10 pm  MESSENGER (representative from lab): Santiago Glad  MD NOTIFIED: [name], PA-C  TIME OF NOTIFICATION:4:14 pm  RESPONSE:

## 2020-09-18 NOTE — Patient Instructions (Signed)
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  Currently clinically stable.  O2 sat 97% in office.  Over the last week reported to be a little bit lethargic and bringing up a little bit of productive mucus.  I will get chest x-ray today to compare to prior x-ray on discharge.  Patient will keep follow-up appointment mid July with pulmonologist.  History of diabetes.  We will get P4D and metabolic panel today.  Leukocytosis on last labs.  We will repeat CBC today.  History of anemia and recent fatigue.  Will get CBC, iron level, B12, B1, vitamin D and iron level.  History of Hodgkin's lymphoma.  Follow-up with oncologist as scheduled.  For follow-up date here to be determined after lab review, chest x-ray review and review of pulmonologist notes.   If any worsening or increase in respiratory type signs/symptoms then recommend ED evaluation.

## 2020-09-18 NOTE — Progress Notes (Signed)
Subjective:    Patient ID: Steve [name], male    DOB: 1955/10/23, 65 y.o.   MRN: [id number]  HPI  Pt in for follow up.  In for follow up for his hospitalization.  Admit date: 08/13/2020 Discharge date: 08/25/2020   Admitted From: home Disposition:  home   Recommendations for Outpatient Follow-up:  Follow up with PCP in 1-2 weeks Please obtain BMP/CBC in one week Follow-up with pulmonary in 2 to 3 weeks   HPI: Per admitting MD, Steve [name] is a 65 y.o. male with medical history significant of chronic tracheostomy after prolonged hospitalization in 2021 related with pneumonia due to CNOBS-96 complicated by ARDS and pulmonary fibrosis +/-pneumonitis from brentuximab, s/p PEG, HTN, DM type II, Hodgkin's lymphoma on chemotherapy, and GERD presents with complaints of shortness of breath over the last 3 to 4 days.  At baseline patient reports that he is on 8 L of oxygen at home, but had been noticing that his oxygenation would drop into the 70s.  Patient reports that his trach cannula keeps getting stopped up with phlegm.  Associated symptoms include fever up to 100.4 F at home, malaise, and wheezing.  He had been eating regular foods unlawful in addition to his tube feeds, but he stopped eating food by mouth 3 days ago as he felt that it was worsening symptoms.   Hospital Course / Discharge diagnoses: Principal Problem Acute on chronic hypoxic respiratory failure in the setting of post-COVID ILD, hospital-acquired pneumonia versus chemotherapy induced pneumonitis (Brentuximab) -patient was initially admitted to the ICU given high oxygen requirements, he was placed on broad-spectrum antibiotics and completed 7 days of cefepime.  He was also placed on high-dose of steroids given concern for chemotherapy induced pneumonitis.  He was initially on vent but then eventually weaned back off to trach collar.  He is now back to his home oxygenation of 8 L nasal cannula.  His IV steroids were  tapered, currently he is on prednisone 40 mg daily which per pulmonology is to be continued until they will see him back in the office.   Active Problems Generalized failure to thrive in the setting of ARDS, COVID-19, trach dependent, severe malnutrition -continue PEG feed tubing Hypotension-patient's blood pressure in the outpatient setting was 283-662 systolic, he appears to be in the 90s now, he is asymptomatic with this.  Suspect related to his progressive failure to thrive/weight loss may be more hypotensive now.  Continue low-dose midodrine, blood pressure improved Hodgkin's lymphoma -Dr. Marin Olp followed patient while hospitalized, may need to think about changing chemotherapy but this can be done as an outpatient Anemia of chronic illness, underlying malignancy -Monitor CBC, no bleeding.  He was transfused a total of 2 units of packed red blood cells on 5/9 and hemoglobin has remained stable since. Type 2 diabetes mellitus -well-controlled, A1c 5.8    Discharge Instructions   Allergies as of 08/25/2020   No Known Allergies          Medication List     TAKE these medications   albuterol (2.5 MG/3ML) 0.083% nebulizer solution Commonly known as: PROVENTIL Take 3 mLs (2.5 mg total) by nebulization every 2 (two) hours as needed for wheezing.    calamine lotion Apply topically as needed for itching. What changed: how much to take when to take this    chlorhexidine 0.12 % solution Commonly known as: PERIDEX 15 mLs by Mouth Rinse route 2 (two) times daily. What changed: when to take this reasons  to take this    Chlorhexidine Gluconate Cloth 2 % Pads Apply 6 each topically daily.    feeding supplement (OSMOLITE 1.2 CAL) Liqd Place 1,000 mLs into feeding tube continuous. What changed: how much to take    feeding supplement (PRO-STAT SUGAR FREE 64) Liqd Place 30 mLs into feeding tube daily.    furosemide 20 MG tablet Commonly known as: LASIX PLACE 1 TABLET (20 MG TOTAL)  INTO FEEDING TUBE DAILY.    guaiFENesin 100 MG/5ML Soln Commonly known as: ROBITUSSIN Place 5 mLs (100 mg total) into feeding tube every 4 (four) hours as needed for cough or to loosen phlegm.    Hyosyne 0.125 MG/ML solution Generic drug: hyoscyamine Take 0.125 mg by mouth every 4 (four) hours as needed for bladder spasms or cramping.    lidocaine-prilocaine cream Commonly known as: EMLA Apply to affected area once What changed: how much to take how to take this when to take this reasons to take this    loperamide HCl 1 MG/7.5ML suspension Commonly known as: IMODIUM Place 15 mLs (2 mg total) into feeding tube as needed for diarrhea or loose stools (maximum 16mg  per day). What changed: when to take this    LORazepam 0.5 MG tablet Commonly known as: Ativan Take 1 tablet (0.5 mg total) by mouth every 6 (six) hours as needed (Nausea or vomiting).    magnesium oxide 400 MG tablet Commonly known as: MAG-OX Take 400 mg by mouth in the morning and at bedtime.    midodrine 5 MG tablet Commonly known as: PROAMATINE Take 1 tablet (5 mg total) by mouth 3 (three) times daily with meals.    multivitamin Liqd Place 15 mLs into feeding tube daily.    ondansetron 4 MG tablet Commonly known as: ZOFRAN Take 1 tablet (4 mg total) by mouth every 6 (six) hours as needed for nausea.    pantoprazole sodium 40 mg/20 mL Pack Commonly known as: PROTONIX Place 20 mLs (40 mg total) into feeding tube daily.    predniSONE 20 MG tablet Commonly known as: DELTASONE Take 2 tablets (40 mg total) by mouth daily with breakfast.    sertraline 50 MG tablet Commonly known as: ZOLOFT GIVE 1 TAB PER TUBE DAILY What changed: See the new instructions.    traZODone 100 MG tablet Commonly known as: DESYREL PLACE 1 TABLET (100 MG TOTAL) INTO FEEDING TUBE AT BEDTIME.    triamcinolone cream 0.1 % Commonly known as: KENALOG Apply 1 application topically 2 (two) times daily as needed (itching).     Vitamin D3 10 MCG/ML Liqd Give 400 Units by tube daily. What changed: how to take this           Consultations: PCCM Oncology   Pt has follow up with pulmonlogist October 27, 2020 with Dr. [name].  O2 sat 97% on 3 liters in office. Pt has Biochemist, clinical at home.  Pt has had 2 covid vaccines. Last covid vaccine 08/06/2020  For one week pt starting to feel more tired than usual. An bringing up little more colored mucus. Wife started giving him augmentin about 4 days agol.   Review of Systems  Constitutional:  Positive for fatigue. Negative for chills, diaphoresis and fever.  HENT:  Negative for congestion and drooling.   Respiratory:  Positive for shortness of breath. Negative for cough, chest tightness and wheezing.   Cardiovascular:  Negative for chest pain and palpitations.  Gastrointestinal:  Negative for abdominal pain.  Genitourinary:  Negative for difficulty urinating,  enuresis, flank pain, frequency and hematuria.  Musculoskeletal:  Negative for back pain, joint swelling and neck stiffness.  Skin:  Negative for rash.    Past Medical History:  Diagnosis Date   Abscess of muscle 08/10/2011   staph infection of right hip    Acute on chronic respiratory failure with hypoxia ([name])    Acute respiratory distress syndrome (ARDS) due to COVID-19 virus ([name])    Anxiety    Diabetes mellitus without complication ([name])    GERD (gastroesophageal reflux disease)    rare reflux - no meds for reflux - NO PROBLEM IN PAST SEVERAL YRS   HCAP (healthcare-associated pneumonia)    Hip dysplasia, congenital    no surgery as a child for hip dysplasia - has had bilateral hip replacements as an adult   Hodgkin lymphoma ([place])    Hypertension    Pancreatitis    Pneumothorax, acute    Postoperative anemia due to acute blood loss 09/07/2012   Septic arthritis of hip ([place]) 09/05/2012   PT'S TOTAL HIP JOINT REMOVED - ANTIBIOTIC SPACE PLACED AND PT HAS FINISHED IV ANTIBIOTICS ( PICC LINE REMOVED)    Sleep apnea    USES CPAP     Social History   Socioeconomic History   Marital status: Married    Spouse name: Not on file   Number of children: Not on file   Years of education: Not on file   Highest education level: Not on file  Occupational History   Not on file  Tobacco Use   Smoking status: Never   Smokeless tobacco: Never  Vaping Use   Vaping Use: Never used  Substance and Sexual Activity   Alcohol use: Yes    Comment: rare beer. 5 times a year at most. 2 beers when he drinks.   Drug use: No   Sexual activity: Yes    Partners: Female  Other Topics Concern   Not on file  Social History Narrative   Married, 1 son and 3 sons.   Welder/fabricator   2 caffeinated beverages daily   Social Determinants of Health   Financial Resource Strain: Not on file  Food Insecurity: Not on file  Transportation Needs: Not on file  Physical Activity: Not on file  Stress: Not on file  Social Connections: Not on file  Intimate Partner Violence: Not on file    Past Surgical History:  Procedure Laterality Date   COLONOSCOPY  04/26/2007   HERNIA REPAIR     inguinal hernia x3   IR GASTROSTOMY TUBE MOD SED  08/22/2019   IR IMAGING GUIDED PORT INSERTION  03/29/2019   JOINT REPLACEMENT  2002 & 2007   bilateral hip replacement   LYMPH NODE BIOPSY Right 03/20/2019   Procedure: DEEP RIGHT SUPRACLAVICULAR LYMPH NODE EXCISION;  Surgeon: Fanny Skates, MD;  Location: [facility];  Service: General;  Laterality: Right;   MULTIPLE EXTRACTIONS WITH ALVEOLOPLASTY  07/28/2011   Procedure: MULTIPLE EXTRACION WITH ALVEOLOPLASTY;  Surgeon: Lenn Cal, DDS;  Location: [facility];  Service: Oral Surgery;  Laterality: N/A;  Extraction of tooth #'s 2,3,4,5,6,11,12,13,15,19,22 with alveoloplasty.   shoulder repair - right for separation of shoulder     TEE WITHOUT CARDIOVERSION  07/29/2011   Procedure: TRANSESOPHAGEAL ECHOCARDIOGRAM (TEE);  Surgeon: Josue Hector, MD;  Location: [facility];  Service: Cardiovascular;  Laterality: N/A;   TOTAL HIP REVISION Right 09/05/2012   Procedure: RIGHT HIP RESECTION ARTHROPLASTY WITH ANTIBIOTIC SPACERS;  Surgeon: [name], MD;  Location: [facility];  Service: Orthopedics;  Laterality: Right;   TOTAL HIP REVISION Right 11/30/2012   Procedure: RIGHT TOTAL HIP ARTHROPLASTY REIMPLANTATION;  Surgeon: [name], MD;  Location: [facility];  Service: Orthopedics;  Laterality: Right;    Family History  Problem Relation Age of Onset   Melanoma Mother    Heart attack Mother    Hyperlipidemia Neg Hx    Sudden death Neg Hx    Hypertension Neg Hx    Diabetes Neg Hx     No Known Allergies  Current Outpatient Medications on File Prior to Visit  Medication Sig Dispense Refill   albuterol (PROVENTIL) (2.5 MG/3ML) 0.083% nebulizer solution Take 3 mLs (2.5 mg total) by nebulization every 2 (two) hours as needed for wheezing. 75 mL 6   Amino Acids-Protein Hydrolys (FEEDING SUPPLEMENT, PRO-STAT SUGAR FREE 64,) LIQD Place 30 mLs into feeding tube daily.     calamine lotion Apply topically as needed for itching. (Patient taking differently: Apply 1 application topically daily as needed for itching.) 120 mL 0   chlorhexidine (PERIDEX) 0.12 % solution 15 mLs by Mouth Rinse route 2 (two) times daily. (Patient taking differently: 15 mLs by Mouth Rinse route 2 (two) times daily as needed (mouth pain).) 120 mL 0   Chlorhexidine Gluconate Cloth 2 % PADS Apply 6 each topically daily.     Cholecalciferol (VITAMIN D3) 10 MCG/ML LIQD Give 400 Units by tube daily. (Patient taking differently: Place 400 Units into feeding tube daily.) 120 mL    furosemide (LASIX) 20 MG tablet PLACE 1 TABLET (20 MG TOTAL) INTO FEEDING TUBE DAILY. 30 tablet 1   guaiFENesin (ROBITUSSIN) 100 MG/5ML SOLN Place 5 mLs (100 mg total) into feeding tube every 4 (four) hours as needed for cough or to loosen phlegm. 236 mL 0   HYOSYNE 0.125 MG/ML solution Take 0.125 mg by mouth every 4  (four) hours as needed for bladder spasms or cramping.     lidocaine-prilocaine (EMLA) cream Apply to affected area once (Patient taking differently: Apply 1 application topically daily as needed (access port). Apply to affected area once) 30 g 3   loperamide HCl (IMODIUM) 1 MG/7.5ML suspension Place 15 mLs (2 mg total) into feeding tube as needed for diarrhea or loose stools (maximum 16mg  per day). (Patient taking differently: Place 2 mg into feeding tube daily as needed for diarrhea or loose stools (maximum 16mg  per day).) 120 mL 0   LORazepam (ATIVAN) 0.5 MG tablet Take 1 tablet (0.5 mg total) by mouth every 6 (six) hours as needed (Nausea or vomiting). 30 tablet 0   magnesium oxide (MAG-OX) 400 MG tablet Take 400 mg by mouth in the morning and at bedtime.     midodrine (PROAMATINE) 5 MG tablet Take 1 tablet (5 mg total) by mouth 3 (three) times daily with meals. 90 tablet 0   Multiple Vitamin (MULTIVITAMIN) LIQD Place 15 mLs into feeding tube daily.     Nutritional Supplements (FEEDING SUPPLEMENT, OSMOLITE 1.2 CAL,) LIQD Place 1,000 mLs into feeding tube continuous. (Patient taking differently: Place 70 mLs into feeding tube continuous.)  0   ondansetron (ZOFRAN) 4 MG tablet Take 1 tablet (4 mg total) by mouth every 6 (six) hours as needed for nausea. 20 tablet 0   pantoprazole sodium (PROTONIX) 40 mg/20 mL PACK Place 20 mLs (40 mg total) into feeding tube daily. 600 mL 0   sertraline (ZOLOFT) 50 MG tablet GIVE 1 TAB PER TUBE DAILY (Patient taking differently: Take 50  mg by mouth daily.) 90 tablet 1   traZODone (DESYREL) 100 MG tablet PLACE 1 TABLET (100 MG TOTAL) INTO FEEDING TUBE AT BEDTIME. 90 tablet 2   triamcinolone (KENALOG) 0.1 % Apply 1 application topically 2 (two) times daily as needed (itching).     No current facility-administered medications on file prior to visit.    BP (!) 102/55   Pulse (!) 108   Resp 18   Ht 5\' 5"  (1.651 m)   Wt 142 lb (64.4 kg)   SpO2 97%   BMI 23.63  kg/m       Objective:   Physical Exam  HENT:    Head: Normocephalic and atraumatic.  Eyes:    Pupils: Pupils are equal, round, and reactive to light.  Neck:    Comments: His neck exam shows no adenopathy bilaterally.  He does have the tracheotomy intact. Cardiovascular:    Rate and Rhythm: Normal rate and regular rhythm.     Heart sounds: Normal heart sounds. rrr Pulmonary:    Effort: Pulmonary effort is normal.     Breath sounds: Normal breath sounds.     Comments but shallow, even and unlabored presently. Abdominal:     General: Bowel sounds are normal.     Palpations: Abdomen is soft. Musculoskeletal:        General: No tenderness or deformity. Normal range of motion.     Cervical back: Normal range of motion.     Comments: no pedal edema Lymphadenopathy:    Cervical: No cervical adenopathy.  Skin:    General: Skin is warm and dry.     Findings: No erythema or rash.  Neurological:    Mental Status: He is alert and oriented to person, place, and time.  Psychiatric:        Behavior: Behavior normal.        Thought Content: Thought content normal.        Judgment: Judgment normal.      Assessment & Plan:   History of acute on chronic respiratory failure post COVID with episodes of pneumonia and repetitive hospitalizations.  Currently clinically stable.  O2 sat 97% in office.  Over the last week reported to be a little bit lethargic and bringing up a little bit of productive mucus.  I will get chest x-ray today to compare to prior x-ray on discharge.  Patient will keep follow-up appointment mid July with pulmonologist.  History of diabetes.  We will get J1P and metabolic panel today.  Leukocytosis on last labs.  We will repeat CBC today.  History of anemia and recent fatigue.  Will get CBC, iron level, B12, B1, vitamin D and iron level.  History of Hodgkin's lymphoma.  Follow-up with oncologist as scheduled.  For follow-up date here to be determined after lab  review, chest x-ray review and review of pulmonologist notes.   If any worsening or increase in respiratory type signs/symptoms then recommend ED evaluation.  [name], PA-C   Time spent with patient today was  41 minutes which consisted of chart revdew, discussing diagnosis, work up, treatment and documentation.

## 2020-09-19 ENCOUNTER — Emergency Department (HOSPITAL_COMMUNITY): Payer: BC Managed Care – PPO

## 2020-09-19 ENCOUNTER — Other Ambulatory Visit: Payer: Self-pay

## 2020-09-19 ENCOUNTER — Inpatient Hospital Stay (HOSPITAL_COMMUNITY)
Admission: EM | Admit: 2020-09-19 | Discharge: 2020-10-26 | DRG: 870 | Disposition: A | Discharge status: Home Health | Payer: BC Managed Care – PPO | Attending: Internal Medicine | Admitting: Internal Medicine

## 2020-09-19 ENCOUNTER — Encounter (HOSPITAL_COMMUNITY): Payer: Self-pay

## 2020-09-19 ENCOUNTER — Ambulatory Visit (HOSPITAL_BASED_OUTPATIENT_CLINIC_OR_DEPARTMENT_OTHER): Admission: RE | Admit: 2020-09-19 | Payer: BC Managed Care – PPO | Source: Ambulatory Visit

## 2020-09-19 DIAGNOSIS — E222 Syndrome of inappropriate secretion of antidiuretic hormone: Secondary | ICD-10-CM | POA: Diagnosis not present

## 2020-09-19 DIAGNOSIS — U099 Post covid-19 condition, unspecified: Secondary | ICD-10-CM | POA: Diagnosis not present

## 2020-09-19 DIAGNOSIS — D630 Anemia in neoplastic disease: Secondary | ICD-10-CM | POA: Diagnosis present

## 2020-09-19 DIAGNOSIS — C8191 Hodgkin lymphoma, unspecified, lymph nodes of head, face, and neck: Secondary | ICD-10-CM | POA: Diagnosis not present

## 2020-09-19 DIAGNOSIS — J9611 Chronic respiratory failure with hypoxia: Secondary | ICD-10-CM | POA: Diagnosis not present

## 2020-09-19 DIAGNOSIS — R7881 Bacteremia: Secondary | ICD-10-CM | POA: Diagnosis present

## 2020-09-19 DIAGNOSIS — B958 Unspecified staphylococcus as the cause of diseases classified elsewhere: Secondary | ICD-10-CM | POA: Diagnosis not present

## 2020-09-19 DIAGNOSIS — R6251 Failure to thrive (child): Secondary | ICD-10-CM

## 2020-09-19 DIAGNOSIS — Z79899 Other long term (current) drug therapy: Secondary | ICD-10-CM | POA: Diagnosis not present

## 2020-09-19 DIAGNOSIS — G620 Drug-induced polyneuropathy: Secondary | ICD-10-CM | POA: Diagnosis not present

## 2020-09-19 DIAGNOSIS — C8112 Nodular sclerosis classical Hodgkin lymphoma, intrathoracic lymph nodes: Secondary | ICD-10-CM

## 2020-09-19 DIAGNOSIS — J984 Other disorders of lung: Secondary | ICD-10-CM | POA: Diagnosis not present

## 2020-09-19 DIAGNOSIS — Z807 Family history of other malignant neoplasms of lymphoid, hematopoietic and related tissues: Secondary | ICD-10-CM | POA: Diagnosis not present

## 2020-09-19 DIAGNOSIS — L8990 Pressure ulcer of unspecified site, unspecified stage: Secondary | ICD-10-CM | POA: Diagnosis present

## 2020-09-19 DIAGNOSIS — J9610 Chronic respiratory failure, unspecified whether with hypoxia or hypercapnia: Secondary | ICD-10-CM | POA: Diagnosis not present

## 2020-09-19 DIAGNOSIS — R5383 Other fatigue: Secondary | ICD-10-CM | POA: Diagnosis not present

## 2020-09-19 DIAGNOSIS — I959 Hypotension, unspecified: Secondary | ICD-10-CM | POA: Diagnosis not present

## 2020-09-19 DIAGNOSIS — F32A Depression, unspecified: Secondary | ICD-10-CM | POA: Diagnosis present

## 2020-09-19 DIAGNOSIS — K219 Gastro-esophageal reflux disease without esophagitis: Secondary | ICD-10-CM | POA: Diagnosis present

## 2020-09-19 DIAGNOSIS — Z8249 Family history of ischemic heart disease and other diseases of the circulatory system: Secondary | ICD-10-CM

## 2020-09-19 DIAGNOSIS — R54 Age-related physical debility: Secondary | ICD-10-CM | POA: Diagnosis present

## 2020-09-19 DIAGNOSIS — J398 Other specified diseases of upper respiratory tract: Secondary | ICD-10-CM | POA: Diagnosis not present

## 2020-09-19 DIAGNOSIS — Z7189 Other specified counseling: Secondary | ICD-10-CM | POA: Diagnosis not present

## 2020-09-19 DIAGNOSIS — E874 Mixed disorder of acid-base balance: Secondary | ICD-10-CM | POA: Diagnosis not present

## 2020-09-19 DIAGNOSIS — E875 Hyperkalemia: Secondary | ICD-10-CM | POA: Diagnosis present

## 2020-09-19 DIAGNOSIS — J9602 Acute respiratory failure with hypercapnia: Secondary | ICD-10-CM

## 2020-09-19 DIAGNOSIS — Z931 Gastrostomy status: Secondary | ICD-10-CM

## 2020-09-19 DIAGNOSIS — R0603 Acute respiratory distress: Secondary | ICD-10-CM | POA: Diagnosis not present

## 2020-09-19 DIAGNOSIS — R042 Hemoptysis: Secondary | ICD-10-CM | POA: Diagnosis not present

## 2020-09-19 DIAGNOSIS — E43 Unspecified severe protein-calorie malnutrition: Secondary | ICD-10-CM | POA: Diagnosis present

## 2020-09-19 DIAGNOSIS — T451X5A Adverse effect of antineoplastic and immunosuppressive drugs, initial encounter: Secondary | ICD-10-CM | POA: Diagnosis not present

## 2020-09-19 DIAGNOSIS — E119 Type 2 diabetes mellitus without complications: Secondary | ICD-10-CM | POA: Diagnosis not present

## 2020-09-19 DIAGNOSIS — J8410 Pulmonary fibrosis, unspecified: Secondary | ICD-10-CM | POA: Diagnosis not present

## 2020-09-19 DIAGNOSIS — Z808 Family history of malignant neoplasm of other organs or systems: Secondary | ICD-10-CM | POA: Diagnosis not present

## 2020-09-19 DIAGNOSIS — E871 Hypo-osmolality and hyponatremia: Secondary | ICD-10-CM

## 2020-09-19 DIAGNOSIS — I10 Essential (primary) hypertension: Secondary | ICD-10-CM | POA: Diagnosis present

## 2020-09-19 DIAGNOSIS — F419 Anxiety disorder, unspecified: Secondary | ICD-10-CM | POA: Diagnosis present

## 2020-09-19 DIAGNOSIS — Z20822 Contact with and (suspected) exposure to covid-19: Secondary | ICD-10-CM | POA: Diagnosis present

## 2020-09-19 DIAGNOSIS — C8198 Hodgkin lymphoma, unspecified, lymph nodes of multiple sites: Secondary | ICD-10-CM | POA: Diagnosis not present

## 2020-09-19 DIAGNOSIS — Z7409 Other reduced mobility: Secondary | ICD-10-CM | POA: Diagnosis not present

## 2020-09-19 DIAGNOSIS — E876 Hypokalemia: Secondary | ICD-10-CM | POA: Diagnosis not present

## 2020-09-19 DIAGNOSIS — J9622 Acute and chronic respiratory failure with hypercapnia: Secondary | ICD-10-CM | POA: Diagnosis not present

## 2020-09-19 DIAGNOSIS — D72829 Elevated white blood cell count, unspecified: Secondary | ICD-10-CM | POA: Diagnosis not present

## 2020-09-19 DIAGNOSIS — J189 Pneumonia, unspecified organism: Secondary | ICD-10-CM | POA: Diagnosis present

## 2020-09-19 DIAGNOSIS — L89151 Pressure ulcer of sacral region, stage 1: Secondary | ICD-10-CM | POA: Diagnosis not present

## 2020-09-19 DIAGNOSIS — C8110 Nodular sclerosis classical Hodgkin lymphoma, unspecified site: Secondary | ICD-10-CM | POA: Diagnosis present

## 2020-09-19 DIAGNOSIS — R64 Cachexia: Secondary | ICD-10-CM | POA: Diagnosis present

## 2020-09-19 DIAGNOSIS — E440 Moderate protein-calorie malnutrition: Secondary | ICD-10-CM | POA: Diagnosis not present

## 2020-09-19 DIAGNOSIS — R627 Adult failure to thrive: Secondary | ICD-10-CM | POA: Diagnosis present

## 2020-09-19 DIAGNOSIS — R7401 Elevation of levels of liver transaminase levels: Secondary | ICD-10-CM

## 2020-09-19 DIAGNOSIS — Z930 Tracheostomy status: Secondary | ICD-10-CM | POA: Diagnosis not present

## 2020-09-19 DIAGNOSIS — D649 Anemia, unspecified: Secondary | ICD-10-CM | POA: Diagnosis not present

## 2020-09-19 DIAGNOSIS — E1165 Type 2 diabetes mellitus with hyperglycemia: Secondary | ICD-10-CM | POA: Diagnosis not present

## 2020-09-19 DIAGNOSIS — A4102 Sepsis due to Methicillin resistant Staphylococcus aureus: Principal | ICD-10-CM | POA: Diagnosis present

## 2020-09-19 DIAGNOSIS — J9621 Acute and chronic respiratory failure with hypoxia: Secondary | ICD-10-CM | POA: Diagnosis not present

## 2020-09-19 DIAGNOSIS — L89152 Pressure ulcer of sacral region, stage 2: Secondary | ICD-10-CM | POA: Diagnosis not present

## 2020-09-19 DIAGNOSIS — Z96641 Presence of right artificial hip joint: Secondary | ICD-10-CM | POA: Diagnosis present

## 2020-09-19 DIAGNOSIS — E873 Alkalosis: Secondary | ICD-10-CM | POA: Diagnosis not present

## 2020-09-19 DIAGNOSIS — C8190 Hodgkin lymphoma, unspecified, unspecified site: Secondary | ICD-10-CM | POA: Diagnosis present

## 2020-09-19 DIAGNOSIS — B957 Other staphylococcus as the cause of diseases classified elsewhere: Secondary | ICD-10-CM | POA: Diagnosis not present

## 2020-09-19 DIAGNOSIS — G4730 Sleep apnea, unspecified: Secondary | ICD-10-CM | POA: Diagnosis present

## 2020-09-19 DIAGNOSIS — A419 Sepsis, unspecified organism: Secondary | ICD-10-CM | POA: Diagnosis not present

## 2020-09-19 DIAGNOSIS — J1282 Pneumonia due to coronavirus disease 2019: Secondary | ICD-10-CM | POA: Diagnosis not present

## 2020-09-19 DIAGNOSIS — R601 Generalized edema: Secondary | ICD-10-CM | POA: Diagnosis not present

## 2020-09-19 DIAGNOSIS — Z9911 Dependence on respirator [ventilator] status: Secondary | ICD-10-CM | POA: Diagnosis not present

## 2020-09-19 DIAGNOSIS — R5381 Other malaise: Secondary | ICD-10-CM | POA: Diagnosis not present

## 2020-09-19 DIAGNOSIS — T380X5A Adverse effect of glucocorticoids and synthetic analogues, initial encounter: Secondary | ICD-10-CM | POA: Diagnosis not present

## 2020-09-19 DIAGNOSIS — Z515 Encounter for palliative care: Secondary | ICD-10-CM | POA: Diagnosis not present

## 2020-09-19 DIAGNOSIS — Z6823 Body mass index (BMI) 23.0-23.9, adult: Secondary | ICD-10-CM

## 2020-09-19 LAB — CBC WITH DIFFERENTIAL/PLATELET
Abs Immature Granulocytes: 0.52 10*3/uL — ABNORMAL HIGH (ref 0.00–0.07)
Basophils Absolute: 0.1 10*3/uL (ref 0.0–0.1)
Basophils Relative: 0 %
Eosinophils Absolute: 0 10*3/uL (ref 0.0–0.5)
Eosinophils Relative: 0 %
HCT: 33.1 % — ABNORMAL LOW (ref 39.0–52.0)
Hemoglobin: 10.2 g/dL — ABNORMAL LOW (ref 13.0–17.0)
Immature Granulocytes: 3 %
Lymphocytes Relative: 8 %
Lymphs Abs: 1.5 10*3/uL (ref 0.7–4.0)
MCH: 28.7 pg (ref 26.0–34.0)
MCHC: 30.8 g/dL (ref 30.0–36.0)
MCV: 93.2 fL (ref 80.0–100.0)
Monocytes Absolute: 1.9 10*3/uL — ABNORMAL HIGH (ref 0.1–1.0)
Monocytes Relative: 10 %
Neutro Abs: 15.1 10*3/uL — ABNORMAL HIGH (ref 1.7–7.7)
Neutrophils Relative %: 79 %
Platelets: 313 10*3/uL (ref 150–400)
RBC: 3.55 MIL/uL — ABNORMAL LOW (ref 4.22–5.81)
RDW: 17.1 % — ABNORMAL HIGH (ref 11.5–15.5)
WBC: 19 10*3/uL — ABNORMAL HIGH (ref 4.0–10.5)
nRBC: 0 % (ref 0.0–0.2)

## 2020-09-19 LAB — URINALYSIS, ROUTINE W REFLEX MICROSCOPIC
Bilirubin Urine: NEGATIVE
Glucose, UA: NEGATIVE mg/dL
Hgb urine dipstick: NEGATIVE
Ketones, ur: NEGATIVE mg/dL
Leukocytes,Ua: NEGATIVE
Nitrite: NEGATIVE
Protein, ur: NEGATIVE mg/dL
Specific Gravity, Urine: 1.012 (ref 1.005–1.030)
pH: 7 (ref 5.0–8.0)

## 2020-09-19 LAB — LACTIC ACID, PLASMA: Lactic Acid, Venous: 0.7 mmol/L (ref 0.5–1.9)

## 2020-09-19 LAB — COMPREHENSIVE METABOLIC PANEL
ALT: 28 U/L (ref 0–44)
AST: 15 U/L (ref 15–41)
Albumin: 2.3 g/dL — ABNORMAL LOW (ref 3.5–5.0)
Alkaline Phosphatase: 83 U/L (ref 38–126)
Anion gap: 10 (ref 5–15)
BUN: 23 mg/dL (ref 8–23)
CO2: 40 mmol/L — ABNORMAL HIGH (ref 22–32)
Calcium: 9.2 mg/dL (ref 8.9–10.3)
Chloride: 81 mmol/L — ABNORMAL LOW (ref 98–111)
Creatinine, Ser: 0.43 mg/dL — ABNORMAL LOW (ref 0.61–1.24)
GFR, Estimated: >60 mL/min (ref >60)
Glucose, Bld: 223 mg/dL — ABNORMAL HIGH (ref 70–99)
Potassium: 4.4 mmol/L (ref 3.5–5.1)
Sodium: 131 mmol/L — ABNORMAL LOW (ref 135–145)
Total Bilirubin: 0.6 mg/dL (ref 0.3–1.2)
Total Protein: 6.3 g/dL — ABNORMAL LOW (ref 6.5–8.1)

## 2020-09-19 LAB — RESP PANEL BY RT-PCR (FLU A&B, COVID) ARPGX2
Influenza A by PCR: NEGATIVE
Influenza B by PCR: NEGATIVE
SARS Coronavirus 2 by RT PCR: NEGATIVE

## 2020-09-19 LAB — CK: Total CK: 11 U/L — ABNORMAL LOW (ref 49–397)

## 2020-09-19 MED ORDER — SODIUM CHLORIDE 0.9 % IV SOLN
2.0000 g | Freq: Three times a day (TID) | INTRAVENOUS | Status: DC
  Administered 2020-09-20 – 2020-09-22 (×10): 2 g via INTRAVENOUS
  Filled 2020-09-19 (×11): qty 2

## 2020-09-19 MED ORDER — SODIUM CHLORIDE 0.9 % IV SOLN
2.0000 g | Freq: Once | INTRAVENOUS | Status: AC
  Administered 2020-09-19: 2 g via INTRAVENOUS
  Filled 2020-09-19: qty 2

## 2020-09-19 MED ORDER — METHYLPREDNISOLONE SODIUM SUCC 125 MG IJ SOLR
125.0000 mg | Freq: Once | INTRAMUSCULAR | Status: AC
  Administered 2020-09-19: 125 mg via INTRAVENOUS
  Filled 2020-09-19: qty 2

## 2020-09-19 MED ORDER — VANCOMYCIN HCL 1250 MG/250ML IV SOLN
1250.0000 mg | INTRAVENOUS | Status: AC
  Administered 2020-09-19: 1250 mg via INTRAVENOUS
  Filled 2020-09-19: qty 250

## 2020-09-19 MED ORDER — VANCOMYCIN HCL 750 MG/150ML IV SOLN
750.0000 mg | Freq: Two times a day (BID) | INTRAVENOUS | Status: DC
  Administered 2020-09-20: 750 mg via INTRAVENOUS
  Filled 2020-09-19 (×2): qty 150

## 2020-09-19 MED ORDER — HYDROCORTISONE NA SUCCINATE PF 100 MG IJ SOLR
50.0000 mg | Freq: Three times a day (TID) | INTRAMUSCULAR | Status: DC
  Administered 2020-09-19 – 2020-09-29 (×30): 50 mg via INTRAVENOUS
  Filled 2020-09-19 (×29): qty 2

## 2020-09-19 MED ORDER — SODIUM CHLORIDE 0.9 % IV BOLUS
500.0000 mL | Freq: Once | INTRAVENOUS | Status: AC
  Administered 2020-09-19: 500 mL via INTRAVENOUS

## 2020-09-19 MED ORDER — ENOXAPARIN SODIUM 40 MG/0.4ML IJ SOSY
40.0000 mg | PREFILLED_SYRINGE | INTRAMUSCULAR | Status: DC
  Administered 2020-09-19 – 2020-10-26 (×38): 40 mg via SUBCUTANEOUS
  Filled 2020-09-19 (×36): qty 0.4

## 2020-09-19 NOTE — Plan of Care (Signed)

## 2020-09-19 NOTE — ED Triage Notes (Addendum)
Primary care doctor referred pt to ED for Hyperkalemia. Pt states he hasn't been feeling very well lately, cannot name any specific reason. As per EMS pts o2 dropped on 6L o2 in route to ED. Pt o2 noted to be 97 on 6L at this time. Pt denies SOB, cough or fever.

## 2020-09-19 NOTE — H&P (Signed)
History and Physical  AUBURN HERT BWI:[id number] DOB: 1955-08-28 DOA: 09/19/2020  Referring physician: Dr. Ralene Bathe PCP: [name], PA-C  Outpatient Specialists: Dr. Marin Olp, oncologist. 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  Patient was discharged from this hospital on 08/25/2020.  Patient was said to have done very well for the first 3 weeks.  Over the last 5 days, patient has been failing to thrive, with associated poor appetite and malaise.  No headache, neck pain, chest pain, shortness of breath, GI symptoms or urinary symptoms.  Patient has been on prednisone 40 Mg p.o. once daily since discharge.  According to the patient's wife, patient was seen by the primary care provider within the last 1 to 2 days and was informed that he had elevated potassium and abnormal liver function test.  Potassium and liver enzymes are within normal range.  However, albumin is low, 2.3.  On presentation, patient is noted to have worsening leukocytosis.  Chest x-ray revealed unchanged multifocal pneumonia superimposed on chronic severe pulmonary fibrosis and unchanged mediastinal lymphadenopathy.  Liver ultrasound revealed biliary sludge without evidence of acute cholecystitis.  Hospitalist team has been asked to admit patient for concerns for possible recurrent pneumonia.  ED Course: Vitals revealed temperature of 98, blood pressure of 97/67, heart rate of 90, respiratory rate of 17 and O2 sat of 93% on 6 L.  Lab work done so far revealed WBC of 19, hemoglobin of 10.2, hematocrit of 33.1, MCV of 93.2 platelet count of 313.  Chest x-ray is as documented above.   Initial revealed sodium of 131, potassium of 4.4, chloride of 81, CO2 40, BUN of 23 with creatinine of 0.43 and blood sugar of 223.  Patient has received IV cefepime and vancomycin in the ED.  Hospitalist team has been asked to admit patient.  Pertinent labs: As documented above.  Imaging: independently reviewed.   Review of Systems:  Negative for fever, visual changes, sore throat, rash, new muscle aches, chest pain, SOB, dysuria, bleeding, n/v/abdominal pain.  Past Medical History:  Diagnosis Date   Abscess of muscle 08/10/2011   staph infection of right hip    Acute on chronic respiratory failure with hypoxia ([name])    Acute respiratory distress syndrome (ARDS) due to COVID-19 virus ([name])    Anxiety    Diabetes mellitus without complication ([name])    GERD (gastroesophageal reflux disease)    rare reflux - no meds for reflux - NO PROBLEM IN PAST SEVERAL YRS   HCAP (healthcare-associated pneumonia)    Hip dysplasia, congenital    no surgery as a child for hip dysplasia - has had bilateral hip replacements as an adult   Hodgkin lymphoma ([place])    Hypertension    Pancreatitis    Pneumothorax, acute    Postoperative anemia due to acute blood loss 09/07/2012   Septic arthritis of hip ([place]) 09/05/2012   PT'S TOTAL HIP JOINT REMOVED - ANTIBIOTIC SPACE PLACED AND PT HAS FINISHED IV ANTIBIOTICS ( PICC LINE REMOVED)   Sleep apnea    USES CPAP    Past Surgical History:  Procedure Laterality Date   COLONOSCOPY  04/26/2007   HERNIA REPAIR     inguinal hernia x3  IR GASTROSTOMY TUBE MOD SED  08/22/2019   IR IMAGING GUIDED PORT INSERTION  03/29/2019   JOINT REPLACEMENT  2002 & 2007   bilateral hip replacement   LYMPH NODE BIOPSY Right 03/20/2019   Procedure: DEEP RIGHT SUPRACLAVICULAR LYMPH NODE EXCISION;  Surgeon: Fanny Skates, MD;  Location: [place];  Service: General;  Laterality: Right;   MULTIPLE EXTRACTIONS WITH ALVEOLOPLASTY  07/28/2011   Procedure: MULTIPLE  EXTRACION WITH ALVEOLOPLASTY;  Surgeon: Lenn Cal, DDS;  Location: [facility];  Service: Oral Surgery;  Laterality: N/A;  Extraction of tooth #'s 2,3,4,5,6,11,12,13,15,19,22 with alveoloplasty.   shoulder repair - right for separation of shoulder     TEE WITHOUT CARDIOVERSION  07/29/2011   Procedure: TRANSESOPHAGEAL ECHOCARDIOGRAM (TEE);  Surgeon: Josue Hector, MD;  Location: [place];  Service: Cardiovascular;  Laterality: N/A;   TOTAL HIP REVISION Right 09/05/2012   Procedure: RIGHT HIP RESECTION ARTHROPLASTY WITH ANTIBIOTIC SPACERS;  Surgeon: [name], MD;  Location: [facility];  Service: Orthopedics;  Laterality: Right;   TOTAL HIP REVISION Right 11/30/2012   Procedure: RIGHT TOTAL HIP ARTHROPLASTY REIMPLANTATION;  Surgeon: [name], MD;  Location: [facility];  Service: Orthopedics;  Laterality: Right;     reports that he has never smoked. He has never used smokeless tobacco. He reports current alcohol use. He reports that he does not use drugs.  No Known Allergies  Family History  Problem Relation Age of Onset   Melanoma Mother    Heart attack Mother    Hyperlipidemia Neg Hx    Sudden death Neg Hx    Hypertension Neg Hx    Diabetes Neg Hx      Prior to Admission medications   Medication Sig Start Date End Date Taking? Authorizing Provider  albuterol (PROVENTIL) (2.5 MG/3ML) 0.083% nebulizer solution Take 3 mLs (2.5 mg total) by nebulization every 2 (two) hours as needed for wheezing. 07/28/20   Saguier, Percell Miller, PA-C  Amino Acids-Protein Hydrolys (FEEDING SUPPLEMENT, PRO-STAT SUGAR FREE 64,) LIQD Place 30 mLs into feeding tube daily.    [provider]  calamine lotion Apply topically as needed for itching. Patient taking differently: Apply 1 application topically daily as needed for itching. 07/18/20   Pokhrel, Corrie Mckusick, MD  chlorhexidine (PERIDEX) 0.12 % solution 15 mLs by Mouth Rinse route 2 (two) times daily. Patient taking differently: 15 mLs by Mouth Rinse  route 2 (two) times daily as needed (mouth pain). 04/17/20   Florencia Reasons, MD  Chlorhexidine Gluconate Cloth 2 % PADS Apply 6 each topically daily. 04/18/20   Florencia Reasons, MD  Cholecalciferol (VITAMIN D3) 10 MCG/ML LIQD Give 400 Units by tube daily. Patient taking differently: Place 400 Units into feeding tube daily. 04/17/20   Florencia Reasons, MD  furosemide (LASIX) 20 MG tablet PLACE 1 TABLET (20 MG TOTAL) INTO FEEDING TUBE DAILY. 08/26/20   Saguier, Percell Miller, PA-C  guaiFENesin (ROBITUSSIN) 100 MG/5ML SOLN Place 5 mLs (100 mg total) into feeding tube every 4 (four) hours as needed for cough or to loosen phlegm. 07/18/20   Pokhrel, Corrie Mckusick, MD  HYOSYNE 0.125 MG/ML solution Take 0.125 mg by mouth every 4 (four) hours as needed for bladder spasms or cramping. 07/20/20   [provider]  lidocaine-prilocaine (EMLA) cream Apply to affected area once Patient taking differently: Apply 1 application topically daily as needed (access port). Apply to affected area once 07/09/20   Volanda Napoleon, MD  loperamide HCl (IMODIUM) 1 MG/7.5ML suspension Place 15 mLs (2 mg  total) into feeding tube as needed for diarrhea or loose stools (maximum 16mg  per day). Patient taking differently: Place 2 mg into feeding tube daily as needed for diarrhea or loose stools (maximum 16mg  per day). 07/20/20   Pokhrel, Corrie Mckusick, MD  LORazepam (ATIVAN) 0.5 MG tablet Take 1 tablet (0.5 mg total) by mouth every 6 (six) hours as needed (Nausea or vomiting). 07/09/20   Volanda Napoleon, MD  magnesium oxide (MAG-OX) 400 MG tablet Take 400 mg by mouth in the morning and at bedtime. 07/18/20   [provider]  midodrine (PROAMATINE) 5 MG tablet Take 1 tablet (5 mg total) by mouth 3 (three) times daily with meals. 08/25/20   Caren Griffins, MD  Multiple Vitamin (MULTIVITAMIN) LIQD Place 15 mLs into feeding tube daily. 08/28/19   Donita Brooks, NP  Nutritional Supplements (FEEDING SUPPLEMENT, OSMOLITE 1.2 CAL,) LIQD Place 1,000 mLs into feeding tube  continuous. Patient taking differently: Place 70 mLs into feeding tube continuous. 04/17/20   Florencia Reasons, MD  ondansetron (ZOFRAN) 4 MG tablet Take 1 tablet (4 mg total) by mouth every 6 (six) hours as needed for nausea. 07/18/20   Pokhrel, Corrie Mckusick, MD  pantoprazole sodium (PROTONIX) 40 mg/20 mL PACK Place 20 mLs (40 mg total) into feeding tube daily. 08/25/20 09/24/20  Caren Griffins, MD  sertraline (ZOLOFT) 50 MG tablet GIVE 1 TAB PER TUBE DAILY Patient taking differently: Take 50 mg by mouth daily. 07/07/20   Saguier, Percell Miller, PA-C  traZODone (DESYREL) 100 MG tablet PLACE 1 TABLET (100 MG TOTAL) INTO FEEDING TUBE AT BEDTIME. 08/03/20   Saguier, Percell Miller, PA-C  triamcinolone (KENALOG) 0.1 % Apply 1 application topically 2 (two) times daily as needed (itching).    [provider]    Physical Exam: Vitals:   09/19/20 1745 09/19/20 1754 09/19/20 1800 09/19/20 1816  BP: 105/67  109/73 103/67  Pulse: 95  96 99  Resp: (!) 23  (!) 23 (!) 30  Temp:      TempSrc:      SpO2: 93% 94% 92% 99%   Constitutional:  Patient is cachectic.  Patient is chronically ill looking.  Eyes:  Patient is pale.  No jaundice.  ENMT:  external ears, nose appear normal Neck:  Neck is supple. No JVD Respiratory:  CTA bilaterally   Cardiovascular:  S1S2 No LE extremity edema   Abdomen:  Abdomen is soft and non tender. Organs are difficult to assess. Neurologic:  Awake and alert. Moves all limbs.  Wt Readings from Last 3 Encounters:  09/18/20 64.4 kg  09/08/20 64.4 kg  08/21/20 52.6 kg    I have personally reviewed following labs and imaging studies  Labs on Admission:  CBC: Recent Labs  Lab 09/18/20 1212 09/19/20 1511  WBC 18.6 Repeated and verified X2.* 19.0*  NEUTROABS 16.5* 15.1*  HGB 10.8* 10.2*  HCT 33.9* 33.1*  MCV 90.6 93.2  PLT 384.0 324   Basic Metabolic Panel: Recent Labs  Lab 09/18/20 1212 09/19/20 1511  NA 135 131*  K 5.9 No hemolysis seen* 4.4  CL 82* 81*  CO2 49* 40*   GLUCOSE 391* 223*  BUN 24* 23  CREATININE 0.53 0.43*  CALCIUM 9.9 9.2   Liver Function Tests: Recent Labs  Lab 09/18/20 1212 09/19/20 1511  AST 17 15  ALT 34 28  ALKPHOS 113 83  BILITOT 0.6 0.6  PROT 6.5 6.3*  ALBUMIN 3.0* 2.3*   No results for input(s): LIPASE, AMYLASE in the last 168 hours.  No results for input(s): AMMONIA in the last 168 hours. Coagulation Profile: No results for input(s): INR, PROTIME in the last 168 hours. Cardiac Enzymes: Recent Labs  Lab 09/19/20 1511  CKTOTAL 11*   BNP (last 3 results) No results for input(s): PROBNP in the last 8760 hours. HbA1C: Recent Labs    09/18/20 1212  HGBA1C 7.2*   CBG: No results for input(s): GLUCAP in the last 168 hours. Lipid Profile: No results for input(s): CHOL, HDL, LDLCALC, TRIG, CHOLHDL, LDLDIRECT in the last 72 hours. Thyroid Function Tests: Recent Labs    09/18/20 1212  TSH 2.85   Anemia Panel: Recent Labs    09/18/20 1212  VITAMINB12 729  IRON 19*   Urine analysis:    Component Value Date/Time   COLORURINE YELLOW 09/19/2020 1511   APPEARANCEUR HAZY (A) 09/19/2020 1511   LABSPEC 1.012 09/19/2020 1511   PHURINE 7.0 09/19/2020 1511   GLUCOSEU NEGATIVE 09/19/2020 1511   HGBUR NEGATIVE 09/19/2020 1511   HGBUR negative 03/23/2007 0000   BILIRUBINUR NEGATIVE 09/19/2020 1511   BILIRUBINUR neg 11/11/2016 1017   KETONESUR NEGATIVE 09/19/2020 1511   PROTEINUR NEGATIVE 09/19/2020 1511   UROBILINOGEN 0.2 11/11/2016 1017   UROBILINOGEN 0.2 11/08/2012 0915   NITRITE NEGATIVE 09/19/2020 1511   LEUKOCYTESUR NEGATIVE 09/19/2020 1511   Sepsis Labs: @LABRCNTIP (procalcitonin:4,lacticidven:4) ) Recent Results (from the past 240 hour(s))  Resp Panel by RT-PCR (Flu A&B, Covid) Nasopharyngeal Swab     Status: None   Collection Time: 09/19/20  3:49 PM   Specimen: Nasopharyngeal Swab; Nasopharyngeal(NP) swabs in vial transport medium  Result Value Ref Range Status   SARS Coronavirus 2 by RT PCR  NEGATIVE NEGATIVE Final    Comment: (NOTE) SARS-CoV-2 target nucleic acids are NOT DETECTED.  The SARS-CoV-2 RNA is generally detectable in upper respiratory specimens during the acute phase of infection. The lowest concentration of SARS-CoV-2 viral copies this assay can detect is 138 copies/mL. A negative result does not preclude SARS-Cov-2 infection and should not be used as the sole basis for treatment or other patient management decisions. A negative result may occur with  improper specimen collection/handling, submission of specimen other than nasopharyngeal swab, presence of viral mutation(s) within the areas targeted by this assay, and inadequate number of viral copies(<138 copies/mL). A negative result must be combined with clinical observations, patient history, and epidemiological information. The expected result is Negative.  Fact Sheet for Patients:  EntrepreneurPulse.com.au  Fact Sheet for Healthcare Providers:  IncredibleEmployment.be  This test is no t yet approved or cleared by the [place] FDA and  has been authorized for detection and/or diagnosis of SARS-CoV-2 by FDA under an Emergency Use Authorization (EUA). This EUA will remain  in effect (meaning this test can be used) for the duration of the COVID-19 declaration under Section 564(b)(1) of the Act, 21 U.S.C.section 360bbb-3(b)(1), unless the authorization is terminated  or revoked sooner.       Influenza A by PCR NEGATIVE NEGATIVE Final   Influenza B by PCR NEGATIVE NEGATIVE Final    Comment: (NOTE) The Xpert Xpress SARS-CoV-2/FLU/RSV plus assay is intended as an aid in the diagnosis of influenza from Nasopharyngeal swab specimens and should not be used as a sole basis for treatment. Nasal washings and aspirates are unacceptable for Xpert Xpress SARS-CoV-2/FLU/RSV testing.  Fact Sheet for Patients: EntrepreneurPulse.com.au  Fact Sheet for  Healthcare Providers: IncredibleEmployment.be  This test is not yet approved or cleared by the [place] FDA and has been authorized for detection and/or  diagnosis of SARS-CoV-2 by FDA under an Emergency Use Authorization (EUA). This EUA will remain in effect (meaning this test can be used) for the duration of the COVID-19 declaration under Section 564(b)(1) of the Act, 21 U.S.C. section 360bbb-3(b)(1), unless the authorization is terminated or revoked.  Performed at [facility], [place] [address], [place], [place] [address]       Radiological Exams on Admission: DG Chest 2 View  Result Date: 09/18/2020 CLINICAL DATA:  Acute on chronic respiratory failure. EXAM: CHEST - 2 VIEW COMPARISON:  Aug 15, 2020. FINDINGS: Stable cardiomegaly. Tracheostomy tube is in good position. Left-sided Port-A-Cath is unchanged. Stable bilateral lung opacities are noted concerning for multifocal pneumonia or chronic scarring. No pneumothorax or pleural effusion is noted. Bony thorax is unremarkable. IMPRESSION: Stable bilateral lung opacities as described above. Electronically Signed   By: Marijo Conception M.D.   On: 09/18/2020 12:59   DG Chest Port 1 View  Result Date: 09/19/2020 CLINICAL DATA:  Shortness of breath. EXAM: PORTABLE CHEST 1 VIEW COMPARISON:  Chest x-ray dated September 18, 2020. FINDINGS: Unchanged tracheostomy tube and left chest wall port catheter. Stable cardiomegaly and mediastinal widening related to underlying lymphadenopathy. Unchanged diffuse patchy nodular opacities throughout both lungs superimposed on chronic pulmonary fibrosis. Unchanged small right pleural effusion. No pneumothorax. No acute osseous abnormality. IMPRESSION: 1. Unchanged multifocal pneumonia superimposed on chronic severe pulmonary fibrosis. 2. Unchanged mediastinal lymphadenopathy. Electronically Signed   By: Titus Dubin M.D.   On: 09/19/2020 15:39   US Abdomen Limited RUQ  (LIVER/GB)  Result Date: 09/19/2020 CLINICAL DATA:  Transaminitis. EXAM: ULTRASOUND ABDOMEN LIMITED RIGHT UPPER QUADRANT COMPARISON:  None. FINDINGS: Gallbladder: No gallbladder wall thickening. Small amount of biliary sludge. No sonographic Murphy sign noted by sonographer. Common bile duct: Diameter: 3 mm, normal Liver: No focal lesion identified. Within normal limits in parenchymal echogenicity. Portal vein is patent on color Doppler imaging with normal direction of blood flow towards the liver. Other: None. IMPRESSION: Biliary sludge without evidence of acute cholecystitis. Electronically Signed   By: Valentino Saxon MD   On: 09/19/2020 16:18     Active Problems:   Failure to thrive (child)   Assessment/Plan Possible pneumonia: -Patient has chronic respiratory failure on 8 L of supplemental oxygen. -Patient is status post tracheostomy. -Patient has history of recurrent pneumonia. -Prior history of COVID and pulmonary fibrosis noted. -Patient was on ventilator for about 8 months last year. -Chest x-ray reveals possible recurrent pneumonia. -We will panculture the patient. -We will start patient on IV vancomycin and cefepime. -We will get a speech evaluation to rule out aspiration. -Further management depend on above.  Failure to thrive: -Patient has Hodgkin's lymphoma. -Patient is chronically ill looking. -Patient has significant anorexia. -For tray of appetite stimulant.    Chronic respiratory failure/pulmonary fibrosis: -History of COVID, intubation for very long time (8 months) and tracheostomy. -Failing to thrive. -On 8 L of supplemental oxygen at home. -Continue to manage expectantly.  Hodgkin's lymphoma: -Patient is known to Dr. Marin Olp. -Low threshold to consult hematology/oncology team  Diabetes mellitus: -Monitor blood sugar ACH S. -Sliding scale insulin coverage.  Malnutrition: -Moderate -Dietary consult. -Caloric count.  Hyponatremia: -Secondary to  poor oral intake.   DVT prophylaxis: Subcutaneous Lovenox Code Status: Full code Family Communication: Wife Disposition Plan: This will depend on hospital course. Consults called: None for now.  Low threshold to consult heme-onc. Admission status: Inpatient  Time spent: 65 minutes  Dana Allan, MD  [facility] Pager #:  [phone] 7PM-7AM contact night coverage as above  09/19/2020, 6:25 PM

## 2020-09-19 NOTE — Progress Notes (Signed)
A consult was received from an ED physician for Vancomycin per pharmacy dosing.  The patient's profile has been reviewed for ht/wt/allergies/indication/available labs.    A one time order has been placed for Vancomycin 1250mg  IV.  Further antibiotics/pharmacy consults should be ordered by admitting physician if indicated.                       Thank you, [name] 09/19/2020  3:57 PM

## 2020-09-19 NOTE — Progress Notes (Signed)
Pharmacy Antibiotic Note  Steve [name] is a 65 y.o. male admitted on 09/19/2020 with possible pneumonia. Pharmacy has been consulted for Vancomycin and Cefepime dosing.  Plan: Vancomycin 1250mg  IV x 1 given in the ED. Continue with Vancomycin 750mg  IV q12h. Vancomycin levels at steady state, as indicated. Cefepime 2g IV q8h. Monitor renal function, cultures, clinical course.      Temp (24hrs), Avg:98.4 F (36.9 C), Min:98 F (36.7 C), Max:98.7 F (37.1 C)  Recent Labs  Lab 09/18/20 1212 09/19/20 1511  WBC 18.6 Repeated and verified X2.* 19.0*  CREATININE 0.53 0.43*  LATICACIDVEN  --  0.7    Estimated Creatinine Clearance: 81.1 mL/min (A) (by C-G formula based on SCr of 0.43 mg/dL (L)).    No Known Allergies  Antimicrobials this admission: 6/11 Vancomycin >> 6/11 Cefepime >>  Dose adjustments this admission: --  Microbiology results: 6/11 BCx:  6/11 UCx:  6/11 Sputum:   6/11 MRSA PCR:  6/11 Strep pneumo urinary antigen:  6/11 Legionella urinary antigen:    Thank you for allowing pharmacy to be a part of this patient's care.  Steve [name] 09/19/2020 7:34 PM

## 2020-09-19 NOTE — ED Provider Notes (Signed)
[facility] DEPT Provider Note   CSN: [id number] Arrival date & time: 09/19/20  1434     History Chief Complaint  Patient presents with   Hyperkalemia    Steve [name] is a 65 y.o. male.  The history is provided by the patient, the spouse and medical records.  Steve [name] is a 65 y.o. male who presents to the Emergency Department complaining of abnormal lab. 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 He was discharged home on May 17 and has been doing well at home since that time. He is on 8 L tricolor at baseline. Over the last week he has been feeling more fatigued and unwell with dizziness and poor appetite. He has no interest in eating. He saw his PCP yesterday and had labs obtained that were significant for hyperkalemia with potassium of 5.9 as well as leukocytosis. He was referred to the emergency department for further evaluation.  no reports of chest pain, abdominal pain, nausea, vomiting, diarrhea, constipation, dysuria, fever. He is making urine but states it is slightly darker than baseline. He is still receiving tube feeds and free water. He has been on a 40 mg of prednisone daily since his recent hospitalization and this was discontinued yesterday.    Past Medical History:  Diagnosis Date   Abscess of muscle 08/10/2011   staph infection of right hip    Acute on chronic respiratory failure with hypoxia ([name])    Acute respiratory distress syndrome (ARDS) due to COVID-19 virus ([name])    Anxiety    Diabetes mellitus without complication ([name])    GERD (gastroesophageal reflux disease)    rare reflux - no meds for reflux - NO PROBLEM IN PAST SEVERAL YRS   HCAP (healthcare-associated pneumonia)    Hip dysplasia, congenital    no surgery as a child for hip dysplasia - has had bilateral hip replacements as an adult   Hodgkin lymphoma ([place])    Hypertension     Pancreatitis    Pneumothorax, acute    Postoperative anemia due to acute blood loss 09/07/2012   Septic arthritis of hip ([place]) 09/05/2012   PT'S TOTAL HIP JOINT REMOVED - ANTIBIOTIC SPACE PLACED AND PT HAS FINISHED IV ANTIBIOTICS ( PICC LINE REMOVED)   Sleep apnea    USES CPAP    Patient Active Problem List   Diagnosis Date Noted   Sepsis due to pneumonia ([place]) 08/13/2020   Acute on chronic respiratory failure with hypoxia and hypercapnia ([place]) 07/21/2020   DM (diabetes mellitus), type 2 ([place]) 07/15/2020   Pneumonia 07/14/2020   Pulmonary fibrosis ([place]) 05/26/2020   Bronchiectasis ([place]) 05/26/2020   Acute hypercapnic respiratory failure ([place]) 05/26/2020   Acute on chronic respiratory failure with hypoxia ([place]) 05/25/2020   Uncontrolled type 2 diabetes mellitus with hyperglycemia, with long-term current use of insulin ([place]) 05/25/2020   Essential hypertension 05/25/2020   GERD without esophagitis 05/25/2020   Debility    Anxiety    Tracheostomy care ([place])    Pneumothorax on right 03/23/2020   Chronic respiratory failure with hypoxia ([name])    ARDS (adult respiratory distress syndrome) ([place])    Hodgkin's lymphoma ([place])    Pneumothorax, acute    Healthcare-associated pneumonia    COVID-19 virus infection    On mechanically assisted ventilation ([place])    Dysphagia    Pneumothorax    Subcutaneous air ([place])    Tracheostomy dependence ([place])  Pneumomediastinum ([name])    Acute respiratory distress syndrome (ARDS) due to COVID-19 virus ([name])    Pressure injury of skin 07/27/2019   Acute respiratory failure with hypoxemia ([name])    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)    Malnutrition of moderate degree 07/10/2019   HCAP (healthcare-associated pneumonia)    Hypoxia    Febrile neutropenia ([place]) 05/17/2019   COVID-19 05/17/2019   Hyponatremia 05/17/2019   Protein-calorie malnutrition, severe ([place]) 05/17/2019   Neutropenic fever ([place]) 05/16/2019    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   Chemotherapy-induced diarrhea 04/22/2019   Anemia due to antineoplastic chemotherapy 03/25/2019   Hodgkin's lymphoma ([place]) 02/25/2019   Abnormal CT scan, pancreas - tail area 01/31/2013   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   Septic arthritis of hip ([place]) 09/05/2012   Routine general medical examination at a health care facility 09/12/2011   Elbow pain 09/12/2011   Abscess of muscle 08/10/2011   H/O dental abscess 08/10/2011   Staphylococcus aureus bacteremia 07/27/2011   Abdominal pain, RLQ 09/02/2010   HYPOGONADISM, MALE 03/23/2007   SLEEP APNEA 03/23/2007    Past Surgical History:  Procedure Laterality Date   COLONOSCOPY  04/26/2007   HERNIA REPAIR     inguinal hernia x3   IR GASTROSTOMY TUBE MOD SED  08/22/2019   IR IMAGING GUIDED PORT INSERTION  03/29/2019   JOINT REPLACEMENT  2002 & 2007   bilateral hip replacement   LYMPH NODE BIOPSY Right 03/20/2019   Procedure: DEEP RIGHT SUPRACLAVICULAR LYMPH NODE EXCISION;  Surgeon: Fanny Skates, MD;  Location: [facility];  Service: General;  Laterality: Right;   MULTIPLE EXTRACTIONS WITH ALVEOLOPLASTY  07/28/2011   Procedure: MULTIPLE EXTRACION WITH ALVEOLOPLASTY;  Surgeon: Lenn Cal, DDS;  Location[facility];  Service: Oral Surgery;  Laterality: N/A;  Extraction of tooth #'s 2,3,4,5,6,11,12,13,15,19,22 with alveoloplasty.   shoulder repair - right for separation of shoulder     TEE WITHOUT CARDIOVERSION  07/29/2011   Procedure: TRANSESOPHAGEAL ECHOCARDIOGRAM (TEE);  Surgeon: Josue Hector, MD;  Location: [facility];  Service: Cardiovascular;  Laterality: N/A;   TOTAL HIP REVISION Right 09/05/2012   Procedure: RIGHT HIP RESECTION ARTHROPLASTY WITH ANTIBIOTIC SPACERS;  Surgeon: [name], MD;  [facility];  Service: Orthopedics;  Laterality: Right;   TOTAL HIP REVISION Right 11/30/2012   Procedure: RIGHT TOTAL HIP ARTHROPLASTY REIMPLANTATION;   Surgeon: [name], MD;  [facility];  Service: Orthopedics;  Laterality: Right;       Family History  Problem Relation Age of Onset   Melanoma Mother    Heart attack Mother    Hyperlipidemia Neg Hx    Sudden death Neg Hx    Hypertension Neg Hx    Diabetes Neg Hx     Social History   Tobacco Use   Smoking status: Never   Smokeless tobacco: Never  Vaping Use   Vaping Use: Never used  Substance Use Topics   Alcohol use: Yes    Comment: rare beer. 5 times a year at most. 2 beers when he drinks.   Drug use: No    Home Medications Prior to Admission medications   Medication Sig Start Date End Date Taking? Authorizing Provider  albuterol (PROVENTIL) (2.5 MG/3ML) 0.083% nebulizer solution Take 3 mLs (2.5 mg total) by nebulization every 2 (two) hours as needed for wheezing. 07/28/20   Saguier, Percell Miller, PA-C  Amino Acids-Protein Hydrolys (FEEDING SUPPLEMENT, PRO-STAT SUGAR FREE 64,) LIQD Place 30 mLs into  feeding tube daily.    [provider]  calamine lotion Apply topically as needed for itching. Patient taking differently: Apply 1 application topically daily as needed for itching. 07/18/20   Pokhrel, Corrie Mckusick, MD  chlorhexidine (PERIDEX) 0.12 % solution 15 mLs by Mouth Rinse route 2 (two) times daily. Patient taking differently: 15 mLs by Mouth Rinse route 2 (two) times daily as needed (mouth pain). 04/17/20   Florencia Reasons, MD  Chlorhexidine Gluconate Cloth 2 % PADS Apply 6 each topically daily. 04/18/20   Florencia Reasons, MD  Cholecalciferol (VITAMIN D3) 10 MCG/ML LIQD Give 400 Units by tube daily. Patient taking differently: Place 400 Units into feeding tube daily. 04/17/20   Florencia Reasons, MD  furosemide (LASIX) 20 MG tablet PLACE 1 TABLET (20 MG TOTAL) INTO FEEDING TUBE DAILY. 08/26/20   Saguier, Percell Miller, PA-C  guaiFENesin (ROBITUSSIN) 100 MG/5ML SOLN Place 5 mLs (100 mg total) into feeding tube every 4 (four) hours as needed for cough or to loosen phlegm. 07/18/20   Pokhrel, Corrie Mckusick, MD   HYOSYNE 0.125 MG/ML solution Take 0.125 mg by mouth every 4 (four) hours as needed for bladder spasms or cramping. 07/20/20   [provider]  lidocaine-prilocaine (EMLA) cream Apply to affected area once Patient taking differently: Apply 1 application topically daily as needed (access port). Apply to affected area once 07/09/20   Volanda Napoleon, MD  loperamide HCl (IMODIUM) 1 MG/7.5ML suspension Place 15 mLs (2 mg total) into feeding tube as needed for diarrhea or loose stools (maximum 16mg  per day). Patient taking differently: Place 2 mg into feeding tube daily as needed for diarrhea or loose stools (maximum 16mg  per day). 07/20/20   Pokhrel, Corrie Mckusick, MD  LORazepam (ATIVAN) 0.5 MG tablet Take 1 tablet (0.5 mg total) by mouth every 6 (six) hours as needed (Nausea or vomiting). 07/09/20   Volanda Napoleon, MD  magnesium oxide (MAG-OX) 400 MG tablet Take 400 mg by mouth in the morning and at bedtime. 07/18/20   [provider]  midodrine (PROAMATINE) 5 MG tablet Take 1 tablet (5 mg total) by mouth 3 (three) times daily with meals. 08/25/20   Caren Griffins, MD  Multiple Vitamin (MULTIVITAMIN) LIQD Place 15 mLs into feeding tube daily. 08/28/19   Donita Brooks, NP  Nutritional Supplements (FEEDING SUPPLEMENT, OSMOLITE 1.2 CAL,) LIQD Place 1,000 mLs into feeding tube continuous. Patient taking differently: Place 70 mLs into feeding tube continuous. 04/17/20   Florencia Reasons, MD  ondansetron (ZOFRAN) 4 MG tablet Take 1 tablet (4 mg total) by mouth every 6 (six) hours as needed for nausea. 07/18/20   Pokhrel, Corrie Mckusick, MD  pantoprazole sodium (PROTONIX) 40 mg/20 mL PACK Place 20 mLs (40 mg total) into feeding tube daily. 08/25/20 09/24/20  Caren Griffins, MD  sertraline (ZOLOFT) 50 MG tablet GIVE 1 TAB PER TUBE DAILY Patient taking differently: Take 50 mg by mouth daily. 07/07/20   Saguier, Percell Miller, PA-C  traZODone (DESYREL) 100 MG tablet PLACE 1 TABLET (100 MG TOTAL) INTO FEEDING TUBE AT BEDTIME.  08/03/20   Saguier, Percell Miller, PA-C  triamcinolone (KENALOG) 0.1 % Apply 1 application topically 2 (two) times daily as needed (itching).    [provider]    Allergies    Patient has no known allergies.  Review of Systems   Review of Systems  All other systems reviewed and are negative.  Physical Exam Updated Vital Signs BP 97/67 (BP Location: Right Arm)   Pulse 90   Temp 98 F (  36.7 C) (Oral)   Resp (!) 23   SpO2 96%   Physical Exam Vitals and nursing note reviewed.  Constitutional:      Appearance: He is well-developed.  HENT:     Head: Normocephalic and atraumatic.  Neck:     Comments: Tracheostomy in place Cardiovascular:     Rate and Rhythm: Normal rate and regular rhythm.     Heart sounds: No murmur heard. Pulmonary:     Effort: Pulmonary effort is normal. No respiratory distress.     Comments: Decreased air movement in left lung base Abdominal:     Palpations: Abdomen is soft.     Tenderness: There is no abdominal tenderness. There is no guarding or rebound.     Comments: Gastrostomy tube in upper abdomen without local tenderness  Musculoskeletal:        General: No swelling or tenderness.  Skin:    General: Skin is warm and dry.  Neurological:     Mental Status: He is alert and oriented to person, place, and time.  Psychiatric:        Behavior: Behavior normal.    ED Results / Procedures / Treatments   Labs (all labs ordered are listed, but only abnormal results are displayed) Labs Reviewed - No data to display  EKG None  Radiology DG Chest 2 View  Result Date: 09/18/2020 CLINICAL DATA:  Acute on chronic respiratory failure. EXAM: CHEST - 2 VIEW COMPARISON:  Aug 15, 2020. FINDINGS: Stable cardiomegaly. Tracheostomy tube is in good position. Left-sided Port-A-Cath is unchanged. Stable bilateral lung opacities are noted concerning for multifocal pneumonia or chronic scarring. No pneumothorax or pleural effusion is noted. Bony thorax is  unremarkable. IMPRESSION: Stable bilateral lung opacities as described above. Electronically Signed   By: Marijo Conception M.D.   On: 09/18/2020 12:59    Procedures Procedures   Medications Ordered in ED Medications - No data to display  ED Course  I have reviewed the triage vital signs and the nursing notes.  Pertinent labs & imaging results that were available during my care of the patient were reviewed by me and considered in my medical decision making (see chart for details).    MDM Rules/Calculators/A&P                         Patient with history of chronic respiratory failure secondary to pneumonitis, Hodgkins lymphoma here for evaluation of malaise, poor appetite over the last week. CBC with rising leukocytosis. BMP with mild hyponatremia. Chest x-ray with persistent infiltrates. Given worsening symptoms he was treated with antibiotics for possible worsening pneumonia. He was also treated with steroids as his last dose was yesterday. Given worsening symptoms plan to admit for observation and further treatment.   Final Clinical Impression(s) / ED Diagnoses Final diagnoses:  None    Rx / DC Orders ED Discharge Orders     None        Quintella Reichert, MD 09/20/20 0001

## 2020-09-20 ENCOUNTER — Inpatient Hospital Stay (HOSPITAL_COMMUNITY): Payer: BC Managed Care – PPO

## 2020-09-20 DIAGNOSIS — R6251 Failure to thrive (child): Secondary | ICD-10-CM | POA: Diagnosis not present

## 2020-09-20 DIAGNOSIS — E440 Moderate protein-calorie malnutrition: Secondary | ICD-10-CM | POA: Diagnosis not present

## 2020-09-20 DIAGNOSIS — C8190 Hodgkin lymphoma, unspecified, unspecified site: Secondary | ICD-10-CM | POA: Diagnosis not present

## 2020-09-20 DIAGNOSIS — J189 Pneumonia, unspecified organism: Secondary | ICD-10-CM | POA: Diagnosis not present

## 2020-09-20 DIAGNOSIS — E222 Syndrome of inappropriate secretion of antidiuretic hormone: Secondary | ICD-10-CM

## 2020-09-20 LAB — BLOOD CULTURE ID PANEL (REFLEXED) - BCID2

## 2020-09-20 LAB — COMPREHENSIVE METABOLIC PANEL
ALT: 30 U/L (ref 0–44)
AST: 15 U/L (ref 15–41)
Albumin: 2.2 g/dL — ABNORMAL LOW (ref 3.5–5.0)
Alkaline Phosphatase: 82 U/L (ref 38–126)
Anion gap: 5 (ref 5–15)
BUN: 25 mg/dL — ABNORMAL HIGH (ref 8–23)
CO2: 43 mmol/L — ABNORMAL HIGH (ref 22–32)
Calcium: 9.6 mg/dL (ref 8.9–10.3)
Chloride: 87 mmol/L — ABNORMAL LOW (ref 98–111)
Creatinine, Ser: 0.55 mg/dL — ABNORMAL LOW (ref 0.61–1.24)
GFR, Estimated: >60 mL/min (ref >60)
Glucose, Bld: 254 mg/dL — ABNORMAL HIGH (ref 70–99)
Potassium: 4.7 mmol/L (ref 3.5–5.1)
Sodium: 135 mmol/L (ref 135–145)
Total Bilirubin: 0.8 mg/dL (ref 0.3–1.2)
Total Protein: 6.7 g/dL (ref 6.5–8.1)

## 2020-09-20 LAB — SODIUM, URINE, RANDOM: Sodium, Ur: 66 mmol/L

## 2020-09-20 LAB — CBC WITH DIFFERENTIAL/PLATELET
Abs Immature Granulocytes: 0.59 10*3/uL — ABNORMAL HIGH (ref 0.00–0.07)
Basophils Absolute: 0.1 10*3/uL (ref 0.0–0.1)
Basophils Relative: 0 %
Eosinophils Absolute: 0 10*3/uL (ref 0.0–0.5)
Eosinophils Relative: 0 %
HCT: 33.3 % — ABNORMAL LOW (ref 39.0–52.0)
Hemoglobin: 10.2 g/dL — ABNORMAL LOW (ref 13.0–17.0)
Immature Granulocytes: 2 %
Lymphocytes Relative: 3 %
Lymphs Abs: 0.8 10*3/uL (ref 0.7–4.0)
MCH: 28.7 pg (ref 26.0–34.0)
MCHC: 30.6 g/dL (ref 30.0–36.0)
MCV: 93.5 fL (ref 80.0–100.0)
Monocytes Absolute: 1.7 10*3/uL — ABNORMAL HIGH (ref 0.1–1.0)
Monocytes Relative: 7 %
Neutro Abs: 21.8 10*3/uL — ABNORMAL HIGH (ref 1.7–7.7)
Neutrophils Relative %: 88 %
Platelets: 314 10*3/uL (ref 150–400)
RBC: 3.56 MIL/uL — ABNORMAL LOW (ref 4.22–5.81)
RDW: 17.2 % — ABNORMAL HIGH (ref 11.5–15.5)
WBC: 24.9 10*3/uL — ABNORMAL HIGH (ref 4.0–10.5)
nRBC: 0 % (ref 0.0–0.2)

## 2020-09-20 LAB — PROCALCITONIN: Procalcitonin: <0.1 ng/mL

## 2020-09-20 LAB — MAGNESIUM: Magnesium: 1.9 mg/dL (ref 1.7–2.4)

## 2020-09-20 LAB — OSMOLALITY: Osmolality: 303 mOsm/kg — ABNORMAL HIGH (ref 275–295)

## 2020-09-20 LAB — TSH: TSH: 0.862 u[IU]/mL (ref 0.350–4.500)

## 2020-09-20 LAB — STREP PNEUMONIAE URINARY ANTIGEN: Strep Pneumo Urinary Antigen: NEGATIVE

## 2020-09-20 LAB — MRSA PCR SCREENING: MRSA by PCR: NEGATIVE

## 2020-09-20 LAB — OSMOLALITY, URINE: Osmolality, Ur: 475 mOsm/kg (ref 300–900)

## 2020-09-20 MED ORDER — VANCOMYCIN HCL 750 MG/150ML IV SOLN
750.0000 mg | INTRAVENOUS | Status: AC
  Administered 2020-09-20: 750 mg via INTRAVENOUS
  Filled 2020-09-20: qty 150

## 2020-09-20 MED ORDER — CHLORHEXIDINE GLUCONATE CLOTH 2 % EX PADS
6.0000 | MEDICATED_PAD | Freq: Every day | CUTANEOUS | Status: DC
  Administered 2020-09-20 – 2020-09-23 (×4): 6 via TOPICAL

## 2020-09-20 MED ORDER — VANCOMYCIN HCL 750 MG/150ML IV SOLN
750.0000 mg | Freq: Two times a day (BID) | INTRAVENOUS | Status: DC
  Administered 2020-09-21 – 2020-10-15 (×49): 750 mg via INTRAVENOUS
  Filled 2020-09-20 (×50): qty 150

## 2020-09-20 NOTE — Evaluation (Signed)
Objective Swallowing Evaluation: Type of Study: MBS-Modified Barium Swallow Study   Patient Details  Name: Steve [name] MRN: [id number] Date of Birth: [birth year]  Today's Date: 09/20/2020 Time: SLP Start Time (ACUTE ONLY): 0903 -SLP Stop Time (ACUTE ONLY): 1962  SLP Time Calculation (min) (ACUTE ONLY): 20 min   Past Medical History:  Past Medical History:  Diagnosis Date   Abscess of muscle 08/10/2011   staph infection of right hip    Acute on chronic respiratory failure with hypoxia ([name])    Acute respiratory distress syndrome (ARDS) due to COVID-19 virus ([place])    Anxiety    Diabetes mellitus without complication ([name])    GERD (gastroesophageal reflux disease)    rare reflux - no meds for reflux - NO PROBLEM IN PAST SEVERAL YRS   HCAP (healthcare-associated pneumonia)    Hip dysplasia, congenital    no surgery as a child for hip dysplasia - has had bilateral hip replacements as an adult   Hodgkin lymphoma ([place])    Hypertension    Pancreatitis    Pneumothorax, acute    Postoperative anemia due to acute blood loss 09/07/2012   Septic arthritis of hip ([place]) 09/05/2012   PT'S TOTAL HIP JOINT REMOVED - ANTIBIOTIC SPACE PLACED AND PT HAS FINISHED IV ANTIBIOTICS ( PICC LINE REMOVED)   Sleep apnea    USES CPAP   Past Surgical History:  Past Surgical History:  Procedure Laterality Date   COLONOSCOPY  04/26/2007   HERNIA REPAIR     inguinal hernia x3   IR GASTROSTOMY TUBE MOD SED  08/22/2019   IR IMAGING GUIDED PORT INSERTION  03/29/2019   JOINT REPLACEMENT  2002 & 2007   bilateral hip replacement   LYMPH NODE BIOPSY Right 03/20/2019   Procedure: DEEP RIGHT SUPRACLAVICULAR LYMPH NODE EXCISION;  Surgeon: Fanny Skates, MD;  Location: [facility];  Service: General;  Laterality: Right;   MULTIPLE EXTRACTIONS WITH ALVEOLOPLASTY  07/28/2011   Procedure: MULTIPLE EXTRACION WITH ALVEOLOPLASTY;  Surgeon: Lenn Cal, DDS;  [facility];  Service: Oral Surgery;   Laterality: N/A;  Extraction of tooth #'s 2,3,4,5,6,11,12,13,15,19,22 with alveoloplasty.   shoulder repair - right for separation of shoulder     TEE WITHOUT CARDIOVERSION  07/29/2011   Procedure: TRANSESOPHAGEAL ECHOCARDIOGRAM (TEE);  Surgeon: Josue Hector, MD;  Location: [facility];  Service: Cardiovascular;  Laterality: N/A;   TOTAL HIP REVISION Right 09/05/2012   Procedure: RIGHT HIP RESECTION ARTHROPLASTY WITH ANTIBIOTIC SPACERS;  Surgeon: [name], MD;  [facility];  Service: Orthopedics;  Laterality: Right;   TOTAL HIP REVISION Right 11/30/2012   Procedure: RIGHT TOTAL HIP ARTHROPLASTY REIMPLANTATION;  Surgeon: [name], MD;  [facility];  Service: Orthopedics;  Laterality: Right;   HPI: Patient is a 65 year old Caucasian  male with medical history significant of chronic tracheostomy after prolonged hospitalization in 2021 related with pneumonia due to [id number] complicated by ARDS and pulmonary fibrosis +/-pneumonitis from brentuximab, s/p PEG, HTN, DM type II, Hodgkin's lymphoma on chemotherapy and GERD.  Patient was discharged from this hospital on 08/25/2020.  Patient was said to have done very well for the first 3 weeks.  Over the last 5 days, patient has been failing to thrive, with associated poor appetite and malaise.  Readmitted with unchanged multifocal PNA.   Subjective: pt alert, eager to go home    Assessment / Plan / Recommendation  CHL IP CLINICAL IMPRESSIONS 09/20/2020  Clinical Impression Patient presents with excellent oropharyngeal  swallowing function. Oral phase timely and with full clearance of bolus. Pharyngeally, strength has improved from previous MBS 03/2020 in that patient has no pharyngeal residue post swallow. Intermittent trace penetration of thin liquids noted, primarily via straw, due to mildly decreased laryngeal closure noted, remaining above the vocal cords and clearing with either subsequent dry swallow or cued throat clear. Tested  all consistencies both with and without PMSV in place with no notable difference in swallowing function. Educated patient and spouse (via phone per patient request) on results of testing and recommendations which include use of PMV if able (not necessary) to faciliate improved strength of cough/throat clear if needed. Both verbalized understanding. SLP will f/u briefly for tolerance and continued education as needed.   Do not suspect that dysphagia is resulting in recurrent PNAs. Cannot however r/o contribution of PEG tube feedings on aspiration, particularly because patient is also taking pos by mouth in conjunction with tube feeds, and complains of feeling full often. Recommend dietary consult to ensure that patient is being fed via tube the correct amount and in the correct manner based on how much he consuming by mouth.  SLP Visit Diagnosis Dysphagia, pharyngeal phase (R13.13)  Attention and concentration deficit following --  Frontal lobe and executive function deficit following --  Impact on safety and function Mild aspiration risk      CHL IP TREATMENT RECOMMENDATION 09/20/2020  Treatment Recommendations Therapy as outlined in treatment plan below     Prognosis 08/23/2020  Prognosis for Safe Diet Advancement Good  Barriers to Reach Goals --  Barriers/Prognosis Comment --    CHL IP DIET RECOMMENDATION 09/20/2020  SLP Diet Recommendations Regular solids;Thin liquid  Liquid Administration via Cup;Straw  Medication Administration Whole meds with liquid  Compensations Slow rate;Small sips/bites  Postural Changes Remain semi-upright after after feeds/meals (Comment);Seated upright at 90 degrees      CHL IP OTHER RECOMMENDATIONS 09/20/2020  Recommended Consults Other (Comment)  Oral Care Recommendations Oral care BID  Other Recommendations --      CHL IP FOLLOW UP RECOMMENDATIONS 09/20/2020  Follow up Recommendations None      CHL IP FREQUENCY AND DURATION 09/20/2020  Speech Therapy  Frequency (ACUTE ONLY) min 1 x/week  Treatment Duration 1 week           CHL IP ORAL PHASE 09/20/2020  Oral Phase WFL  Oral - Pudding Teaspoon --  Oral - Pudding Cup --  Oral - Honey Teaspoon --  Oral - Honey Cup --  Oral - Nectar Teaspoon --  Oral - Nectar Cup --  Oral - Nectar Straw --  Oral - Thin Teaspoon --  Oral - Thin Cup --  Oral - Thin Straw --  Oral - Puree --  Oral - Mech Soft --  Oral - Regular --  Oral - Multi-Consistency --  Oral - Pill --  Oral Phase - Comment --    CHL IP PHARYNGEAL PHASE 09/20/2020  Pharyngeal Phase Impaired  Pharyngeal- Pudding Teaspoon --  Pharyngeal --  Pharyngeal- Pudding Cup --  Pharyngeal --  Pharyngeal- Honey Teaspoon --  Pharyngeal --  Pharyngeal- Honey Cup --  Pharyngeal --  Pharyngeal- Nectar Teaspoon --  Pharyngeal --  Pharyngeal- Nectar Cup --  Pharyngeal --  Pharyngeal- Nectar Straw --  Pharyngeal --  Pharyngeal- Thin Teaspoon NT  Pharyngeal --  Pharyngeal- Thin Cup WFL  Pharyngeal --  Pharyngeal- Thin Straw Reduced laryngeal elevation;Penetration/Aspiration during swallow  Pharyngeal Material enters airway, remains ABOVE vocal cords and not  ejected out  Pharyngeal- Puree WFL  Pharyngeal --  Pharyngeal- Mechanical Soft WFL  Pharyngeal --  Pharyngeal- Regular --  Pharyngeal --  Pharyngeal- Multi-consistency --  Pharyngeal --  Pharyngeal- Pill WFL  Pharyngeal --  Pharyngeal Comment --     CHL IP CERVICAL ESOPHAGEAL PHASE 09/20/2020  Cervical Esophageal Phase WFL  Pudding Teaspoon --  Pudding Cup --  Honey Teaspoon --  Honey Cup --  Nectar Teaspoon --  Nectar Cup --  Nectar Straw --  Thin Teaspoon --  Thin Cup --  Thin Straw --  Puree --  Mechanical Soft --  Regular --  Multi-consistency --  Pill --  Cervical Esophageal Comment --    Gabriel Rainwater MA, CCC-SLP  [place] 09/20/2020, 9:31 AM

## 2020-09-20 NOTE — Progress Notes (Signed)
PHARMACY - PHYSICIAN COMMUNICATION CRITICAL VALUE ALERT - BLOOD CULTURE IDENTIFICATION (BCID)  Steve [name] is an 65 y.o. male who presented to [facility] on 09/19/2020 with a chief complaint of failure to thrive.   Assessment: possible pneumonia. Now 4/4 blood culture bottles + for Staph epidermidis (methicillin resistance detected)   Name of physician (or Provider) Contacted: Dr. Marthenia Rolling  Current antibiotics: Cefepime (Vancomycin was discontinued earlier today).   Changes to prescribed antibiotics recommended:  Resume Vancomycin.   Will resume dosing of Vancomycin 750mg  IV q12h. Follow up culture results.   Results for orders placed or performed during the hospital encounter of 09/19/20  Blood Culture ID Panel (Reflexed) (Collected: 09/19/2020  3:11 PM)  Result Value Ref Range   Enterococcus faecalis NOT DETECTED NOT DETECTED   Enterococcus Faecium NOT DETECTED NOT DETECTED   Listeria monocytogenes NOT DETECTED NOT DETECTED   Staphylococcus species DETECTED (A) NOT DETECTED   Staphylococcus aureus (BCID) NOT DETECTED NOT DETECTED   Staphylococcus epidermidis DETECTED (A) NOT DETECTED   Staphylococcus lugdunensis NOT DETECTED NOT DETECTED   Streptococcus species NOT DETECTED NOT DETECTED   Streptococcus agalactiae NOT DETECTED NOT DETECTED   Streptococcus pneumoniae NOT DETECTED NOT DETECTED   Streptococcus pyogenes NOT DETECTED NOT DETECTED   A.calcoaceticus-baumannii NOT DETECTED NOT DETECTED   Bacteroides fragilis NOT DETECTED NOT DETECTED   Enterobacterales NOT DETECTED NOT DETECTED   Enterobacter cloacae complex NOT DETECTED NOT DETECTED   Escherichia coli NOT DETECTED NOT DETECTED   Klebsiella aerogenes NOT DETECTED NOT DETECTED   Klebsiella oxytoca NOT DETECTED NOT DETECTED   Klebsiella pneumoniae NOT DETECTED NOT DETECTED   Proteus species NOT DETECTED NOT DETECTED   Salmonella species NOT DETECTED NOT DETECTED   Serratia marcescens NOT DETECTED NOT DETECTED    Haemophilus influenzae NOT DETECTED NOT DETECTED   Neisseria meningitidis NOT DETECTED NOT DETECTED   Pseudomonas aeruginosa NOT DETECTED NOT DETECTED   Stenotrophomonas maltophilia NOT DETECTED NOT DETECTED   Candida albicans NOT DETECTED NOT DETECTED   Candida auris NOT DETECTED NOT DETECTED   Candida glabrata NOT DETECTED NOT DETECTED   Candida krusei NOT DETECTED NOT DETECTED   Candida parapsilosis NOT DETECTED NOT DETECTED   Candida tropicalis NOT DETECTED NOT DETECTED   Cryptococcus neoformans/gattii NOT DETECTED NOT DETECTED   Methicillin resistance mecA/C DETECTED (A) NOT DETECTED    [name] 09/20/2020  5:06 PM

## 2020-09-20 NOTE — Evaluation (Signed)
Clinical/Bedside Swallow Evaluation Steve [name] Details  Name: Steve [name] MRN: [id number] Date of Birth: January 24, 1956  Today's Date: 09/20/2020 Time: SLP Start Time (ACUTE ONLY): 0805 SLP Stop Time (ACUTE ONLY): 0830 SLP Time Calculation (min) (ACUTE ONLY): 25 min  Past Medical History:  Past Medical History:  Diagnosis Date   Abscess of muscle 08/10/2011   staph infection of right hip    Acute on chronic respiratory failure with hypoxia ([name])    Acute respiratory distress syndrome (ARDS) due to COVID-19 virus ([place])    Anxiety    Diabetes mellitus without complication ([name])    GERD (gastroesophageal reflux disease)    rare reflux - no meds for reflux - NO PROBLEM IN PAST SEVERAL YRS   HCAP (healthcare-associated pneumonia)    Hip dysplasia, congenital    no surgery as a child for hip dysplasia - has had bilateral hip replacements as an adult   Hodgkin lymphoma ([place])    Hypertension    Pancreatitis    Pneumothorax, acute    Postoperative anemia due to acute blood loss 09/07/2012   Septic arthritis of hip ([place]) 09/05/2012   PT'S TOTAL HIP JOINT REMOVED - ANTIBIOTIC SPACE PLACED AND PT HAS FINISHED IV ANTIBIOTICS ( PICC LINE REMOVED)   Sleep apnea    USES CPAP   Past Surgical History:  Past Surgical History:  Procedure Laterality Date   COLONOSCOPY  04/26/2007   HERNIA REPAIR     inguinal hernia x3   IR GASTROSTOMY TUBE MOD SED  08/22/2019   IR IMAGING GUIDED PORT INSERTION  03/29/2019   JOINT REPLACEMENT  2002 & 2007   bilateral hip replacement   LYMPH NODE BIOPSY Right 03/20/2019   Procedure: DEEP RIGHT SUPRACLAVICULAR LYMPH NODE EXCISION;  Surgeon: Fanny Skates, MD;  Location: [facility];  Service: General;  Laterality: Right;   MULTIPLE EXTRACTIONS WITH ALVEOLOPLASTY  07/28/2011   Procedure: MULTIPLE EXTRACION WITH ALVEOLOPLASTY;  Surgeon: Lenn Cal, DDS;  Location: [facility];  Service: Oral Surgery;  Laterality: N/A;  Extraction of tooth #'s  2,3,4,5,6,11,12,13,15,19,22 with alveoloplasty.   shoulder repair - right for separation of shoulder     TEE WITHOUT CARDIOVERSION  07/29/2011   Procedure: TRANSESOPHAGEAL ECHOCARDIOGRAM (TEE);  Surgeon: Josue Hector, MD;  Location: [facility];  Service: Cardiovascular;  Laterality: N/A;   TOTAL HIP REVISION Right 09/05/2012   Procedure: RIGHT HIP RESECTION ARTHROPLASTY WITH ANTIBIOTIC SPACERS;  Surgeon: [name], MD;  Location: [facility];  Service: Orthopedics;  Laterality: Right;   TOTAL HIP REVISION Right 11/30/2012   Procedure: RIGHT TOTAL HIP ARTHROPLASTY REIMPLANTATION;  Surgeon: [name], MD;  Location: [facility];  Service: Orthopedics;  Laterality: Right;   HPI:  Steve [name] is a 65 year old Caucasian  male with medical history significant of chronic tracheostomy after prolonged hospitalization in 2021 related with pneumonia due to [id number] complicated by ARDS and pulmonary fibrosis +/-pneumonitis from brentuximab, s/p PEG, HTN, DM type II, Hodgkin's lymphoma on chemotherapy and GERD.  Steve [name] was discharged from this hospital on 08/25/2020.  Steve [name] was said to have done very well for the first 3 weeks.  Over the last 5 days, Steve [name] has been failing to thrive, with associated poor appetite and malaise.  Readmitted with unchanged multifocal PNA.   Assessment / Plan / Recommendation Clinical Impression  Based on chart review and reoccurance of PNA again since 08/2020, SLP recommends at this time that instrumental testing be repeated for updated information on swallowing physiology. Steve [name] alert and cooperative,  leak speech noted upon arrival to room with improved vocal intensity (remains moderately low volume and hoarse) following placement of PMV which Steve [name] tolerated for greater than 15 minutes. Thin liquid (water) provided to assess ability to self feed, orally transit bolus, and initiate a swallow in preparation for instrumental testing. Steve [name] able to do so independently and without  overt indication of aspiration. He is appropriate to proceed with instrumental testing. Will proceed with MBS this am. Pt and nurse aware. SLP Visit Diagnosis: Dysphagia, unspecified (R13.10)    Aspiration Risk       Diet Recommendation NPO   Medication Administration: Via alternative means    Other  Recommendations Oral Care Recommendations: Oral care QID   Follow up Recommendations None      Frequency and Duration            Prognosis        Swallow Study   General HPI: Steve [name] is a 65 year old Caucasian  male with medical history significant of chronic tracheostomy after prolonged hospitalization in 2021 related with pneumonia due to [id number] complicated by ARDS and pulmonary fibrosis +/-pneumonitis from brentuximab, s/p PEG, HTN, DM type II, Hodgkin's lymphoma on chemotherapy and GERD.  Steve [name] was discharged from this hospital on 08/25/2020.  Steve [name] was said to have done very well for the first 3 weeks.  Over the last 5 days, Steve [name] has been failing to thrive, with associated poor appetite and malaise.  Readmitted with unchanged multifocal PNA. Type of Study: Bedside Swallow Evaluation Previous Swallow Assessment: seen in 08/2020 Diet Prior to this Study: NPO Temperature Spikes Noted: No Respiratory Status: Trach Collar (60% FiO2) History of Recent Intubation: No Behavior/Cognition: Alert;Cooperative;Pleasant mood Oral Cavity Assessment: Within Functional Limits Oral Care Completed by SLP: No Oral Cavity - Dentition: Dentures, top;Dentures, bottom Vision: Functional for self-feeding Self-Feeding Abilities: Able to feed self Steve [name] Positioning: Upright in bed Baseline Vocal Quality: Hoarse;Low vocal intensity (this is baseline, with PMV in place) Volitional Cough: Strong Volitional Swallow: Able to elicit    Oral/Motor/Sensory Function Overall Oral Motor/Sensory Function: Within functional limits   Ice Chips Ice chips: Not tested   Thin Liquid Thin Liquid: Within  functional limits Presentation: Cup;Self Fed;Straw    Nectar Thick Nectar Thick Liquid: Not tested   Honey Thick Honey Thick Liquid: Not tested   Puree Puree: Not tested   Solid     Solid: Not tested     Gabriel Rainwater MA, CCC-SLP  Dayyan Krist Meryl 09/20/2020,9:23 AM

## 2020-09-20 NOTE — Progress Notes (Signed)
PROGRESS NOTE    Steve [name]  LHT:[id number] DOB: 1955-11-20 DOA: 09/19/2020 PCP: [name], PA-C  Outpatient Specialists:   Brief Narrative:  Patient is a 65 year old Caucasian  male with medical history significant of chronic tracheostomy after prolonged hospitalization in 2021 related with pneumonia due to XBWIO-03 complicated by ARDS and pulmonary fibrosis +/-pneumonitis from brentuximab, s/p PEG, HTN, DM type II, Hodgkin's lymphoma on chemotherapy and GERD.  Patient was discharged from this hospital on 08/25/2020.  Patient was said to have done very well for the first 3 weeks.  Over the last 5 days, patient has been failing to thrive, with associated poor appetite and malaise.  There are concerns for possible pneumonia.  However, the procalcitonin is within normal range.  Chest x-ray is difficult to interpret, considering history of COVID and baseline abnormal chest x-ray.  We will proceed with CT scan of the chest without contrast.  Patient was started on IV vancomycin and cefepime on presentation.  IV vancomycin has been discontinued.  Discussed with oncology team on call, Dr. Alvy Bimler.  Dr. Marin Olp will see patient tomorrow.   Assessment & Plan:   Active Problems:   Failure to thrive (child)  Possible pneumonia: -Patient has chronic respiratory failure on 8 L of supplemental oxygen. -Patient is status post tracheostomy. -Patient has history of recurrent pneumonia. -Prior history of COVID and pulmonary fibrosis noted. -Patient was on ventilator for about 8 months last year. -Chest x-ray reveals possible recurrent pneumonia, however, chest x-ray is difficult to interpret. -We will proceed with CT scan of the chest without contrast. -We will follow culture results.  . -IV vancomycin will be discontinued. -Continue IV cefepime. -Speech evaluation is appreciated.  . -Further management depend on above.  Failure to thrive: -Patient has Hodgkin's lymphoma. -Patient is  chronically ill looking. -Patient has significant anorexia. -Oncology team is being consulted.   Chronic respiratory failure/pulmonary fibrosis: -History of COVID, intubation for very long time (8 months) and tracheostomy. -Failing to thrive. -On 8 L of supplemental oxygen at home. -Continue to manage expectantly.  Hodgkin's lymphoma: -Patient is known to Dr. Marin Olp. -Oncology team has been consulted.     Diabetes mellitus: -Monitor blood sugar ACHS. -Sliding scale insulin coverage.  Malnutrition: -Moderate -Dietary consult. -Caloric count.  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      DVT prophylaxis: Subcutaneous Lovenox Code Status: Full code Family Communication:  Disposition Plan: This will depend on hospital course   Consultants:  Hematology/oncology team  Procedures:  None.  Antimicrobials:  Started on IV vancomycin on presentation, but now discontinued. Intravenous cefepime.   Subjective: No new changes. Patient remains weak.  Objective: Vitals:   09/20/20 0839 09/20/20 1000 09/20/20 1217 09/20/20 1228  BP:  102/67  104/70  Pulse: 88 85 95 92  Resp: (!) 26 20  18   Temp:  97.8 F (36.6 C)  97.6 F (36.4 C)  TempSrc:  Oral  Oral  SpO2: 97% 94% 96% 93%  Weight:        Intake/Output Summary (Last 24 hours) at 09/20/2020 1603 Last data filed at 09/20/2020 1549 Gross per 24 hour  Intake 1324.58 ml  Output 875 ml  Net 449.58 ml   Filed Weights   09/19/20 1930  Weight: 63.3 kg    Examination:  General exam: Patient is cachectic.  Appears calm and comfortable.  Chronically ill looking.  Patient is pale.Respiratory effort normal. Cardiovascular system: S1 & S2  Gastrointestinal system: Abdomen is nondistended, soft and nontender. No organomegaly  or masses felt. Normal bowel sounds heard. Central nervous system: Awake and alert.  Patient moves all extremities.   Extremities: No leg edema.  Data Reviewed: I have personally reviewed  following labs and imaging studies  CBC: Recent Labs  Lab 09/18/20 1212 09/19/20 1511 09/20/20 1015  WBC 18.6 Repeated and verified X2.* 19.0* 24.9*  NEUTROABS 16.5* 15.1* 21.8*  HGB 10.8* 10.2* 10.2*  HCT 33.9* 33.1* 33.3*  MCV 90.6 93.2 93.5  PLT 384.0 313 443   Basic Metabolic Panel: Recent Labs  Lab 09/18/20 1212 09/19/20 1511 09/20/20 0708  NA 135 131* 135  K 5.9 No hemolysis seen* 4.4 4.7  CL 82* 81* 87*  CO2 49* 40* 43*  GLUCOSE 391* 223* 254*  BUN 24* 23 25*  CREATININE 0.53 0.43* 0.55*  CALCIUM 9.9 9.2 9.6  MG  --   --  1.9   GFR: Estimated Creatinine Clearance: 81.1 mL/min (A) (by C-G formula based on SCr of 0.55 mg/dL (L)). Liver Function Tests: Recent Labs  Lab 09/18/20 1212 09/19/20 1511 09/20/20 0708  AST 17 15 15   ALT 34 28 30  ALKPHOS 113 83 82  BILITOT 0.6 0.6 0.8  PROT 6.5 6.3* 6.7  ALBUMIN 3.0* 2.3* 2.2*   No results for input(s): LIPASE, AMYLASE in the last 168 hours. No results for input(s): AMMONIA in the last 168 hours. Coagulation Profile: No results for input(s): INR, PROTIME in the last 168 hours. Cardiac Enzymes: Recent Labs  Lab 09/19/20 1511  CKTOTAL 11*   BNP (last 3 results) No results for input(s): PROBNP in the last 8760 hours. HbA1C: Recent Labs    09/18/20 1212  HGBA1C 7.2*   CBG: No results for input(s): GLUCAP in the last 168 hours. Lipid Profile: No results for input(s): CHOL, HDL, LDLCALC, TRIG, CHOLHDL, LDLDIRECT in the last 72 hours. Thyroid Function Tests: Recent Labs    09/20/20 0708  TSH 0.862   Anemia Panel: Recent Labs    09/18/20 1212  VITAMINB12 729  IRON 19*   Urine analysis:    Component Value Date/Time   COLORURINE YELLOW 09/19/2020 1511   APPEARANCEUR HAZY (A) 09/19/2020 1511   LABSPEC 1.012 09/19/2020 1511   PHURINE 7.0 09/19/2020 1511   GLUCOSEU NEGATIVE 09/19/2020 1511   HGBUR NEGATIVE 09/19/2020 1511   HGBUR negative 03/23/2007 0000   BILIRUBINUR NEGATIVE 09/19/2020 1511    BILIRUBINUR neg 11/11/2016 1017   KETONESUR NEGATIVE 09/19/2020 1511   PROTEINUR NEGATIVE 09/19/2020 1511   UROBILINOGEN 0.2 11/11/2016 1017   UROBILINOGEN 0.2 11/08/2012 0915   NITRITE NEGATIVE 09/19/2020 1511   LEUKOCYTESUR NEGATIVE 09/19/2020 1511   Sepsis Labs: @LABRCNTIP (procalcitonin:4,lacticidven:4)  ) Recent Results (from the past 240 hour(s))  Culture, blood (routine x 2)     Status: None (Preliminary result)   Collection Time: 09/19/20  3:11 PM   Specimen: BLOOD  Result Value Ref Range Status   Specimen Description   Final    BLOOD PORTA CATH Performed at [facility], [place] [address] [place], [place] [address]    Special Requests   Final    BOTTLES DRAWN AEROBIC AND ANAEROBIC Blood Culture results may not be optimal due to an excessive volume of blood received in culture bottles Performed at [facility] [address], [place], [place] [address]    Culture  Setup Time   Final    GRAM POSITIVE COCCI IN BOTH AEROBIC AND ANAEROBIC BOTTLES CRITICAL RESULT CALLED TO, READ BACK BY AND VERIFIED WITH: M,SWAYNE  PHARMD @1552  09/20/20 EB Performed at [facility] [address], [place], [place] [address]    Culture GRAM POSITIVE COCCI  Final   Report Status PENDING  Incomplete  Culture, blood (routine x 2)     Status: None (Preliminary result)   Collection Time: 09/19/20  3:16 PM   Specimen: BLOOD  Result Value Ref Range Status   Specimen Description   Final    BLOOD SITE NOT SPECIFIED Performed at [facility] [address] [place], [place] [address]    Special Requests   Final    BOTTLES DRAWN AEROBIC AND ANAEROBIC Blood Culture adequate volume Performed at [facility] [address], [place], [place] [address]    Culture  Setup Time   Final    GRAM POSITIVE COCCI IN BOTH AEROBIC AND ANAEROBIC BOTTLES CRITICAL VALUE NOTED.  VALUE IS CONSISTENT WITH PREVIOUSLY REPORTED AND  CALLED VALUE. Performed at [facility], [place] [address], [place], [place] [address]    Culture GRAM POSITIVE COCCI  Final   Report Status PENDING  Incomplete  Resp Panel by RT-PCR (Flu A&B, Covid) Nasopharyngeal Swab     Status: None   Collection Time: 09/19/20  3:49 PM   Specimen: Nasopharyngeal Swab; Nasopharyngeal(NP) swabs in vial transport medium  Result Value Ref Range Status   SARS Coronavirus 2 by RT PCR NEGATIVE NEGATIVE Final    Comment: (NOTE) SARS-CoV-2 target nucleic acids are NOT DETECTED.  The SARS-CoV-2 RNA is generally detectable in upper respiratory specimens during the acute phase of infection. The lowest concentration of SARS-CoV-2 viral copies this assay can detect is 138 copies/mL. A negative result does not preclude SARS-Cov-2 infection and should not be used as the sole basis for treatment or other patient management decisions. A negative result may occur with  improper specimen collection/handling, submission of specimen other than nasopharyngeal swab, presence of viral mutation(s) within the areas targeted by this assay, and inadequate number of viral copies(<138 copies/mL). A negative result must be combined with clinical observations, patient history, and epidemiological information. The expected result is Negative.  Fact Sheet for Patients:  EntrepreneurPulse.com.au  Fact Sheet for Healthcare Providers:  IncredibleEmployment.be  This test is no t yet approved or cleared by the [place] FDA and  has been authorized for detection and/or diagnosis of SARS-CoV-2 by FDA under an Emergency Use Authorization (EUA). This EUA will remain  in effect (meaning this test can be used) for the duration of the COVID-19 declaration under Section 564(b)(1) of the Act, 21 U.S.C.section 360bbb-3(b)(1), unless the authorization is terminated  or revoked sooner.       Influenza A by PCR NEGATIVE NEGATIVE Final   Influenza  B by PCR NEGATIVE NEGATIVE Final    Comment: (NOTE) The Xpert Xpress SARS-CoV-2/FLU/RSV plus assay is intended as an aid in the diagnosis of influenza from Nasopharyngeal swab specimens and should not be used as a sole basis for treatment. Nasal washings and aspirates are unacceptable for Xpert Xpress SARS-CoV-2/FLU/RSV testing.  Fact Sheet for Patients: EntrepreneurPulse.com.au  Fact Sheet for Healthcare Providers: IncredibleEmployment.be  This test is not yet approved or cleared by the [place] FDA and has been authorized for detection and/or diagnosis of SARS-CoV-2 by FDA under an Emergency Use Authorization (EUA). This EUA will remain in effect (meaning this test can be used) for the duration of the COVID-19 declaration under Section 564(b)(1) of the Act, 21 U.S.C. section 360bbb-3(b)(1), unless the authorization is terminated or  revoked.  Performed at [facility], [place] [address], [place], [place] [address]   MRSA PCR Screening     Status: None   Collection Time: 09/20/20  6:50 AM   Specimen: Nasopharyngeal  Result Value Ref Range Status   MRSA by PCR NEGATIVE NEGATIVE Final    Comment:        The GeneXpert MRSA Assay (FDA approved for NASAL specimens only), is one component of a comprehensive MRSA colonization surveillance program. It is not intended to diagnose MRSA infection nor to guide or monitor treatment for MRSA infections. Performed at [facility], [place] [address] [place], [place] [address]          Radiology Studies: [facility] Chest Port 1 View  Result Date: 09/19/2020 CLINICAL DATA:  Shortness of breath. EXAM: PORTABLE CHEST 1 VIEW COMPARISON:  Chest x-ray dated September 18, 2020. FINDINGS: Unchanged tracheostomy tube and left chest wall port catheter. Stable cardiomegaly and mediastinal widening related to underlying lymphadenopathy. Unchanged diffuse patchy nodular opacities  throughout both lungs superimposed on chronic pulmonary fibrosis. Unchanged small right pleural effusion. No pneumothorax. No acute osseous abnormality. IMPRESSION: 1. Unchanged multifocal pneumonia superimposed on chronic severe pulmonary fibrosis. 2. Unchanged mediastinal lymphadenopathy. Electronically Signed   By: Titus Dubin M.D.   On: 09/19/2020 15:39   DG Swallowing Func-Speech Pathology  Result Date: 09/20/2020 Formatting of this result is different from the original. Objective Swallowing Evaluation: Type of Study: MBS-Modified Barium Swallow Study  Patient Details Name: AMANI NODARSE MRN: [id number] Date of Birth: [birth year] Today's Date: 09/20/2020 Time: SLP Start Time (ACUTE ONLY): 0903 -SLP Stop Time (ACUTE ONLY): 7628 SLP Time Calculation (min) (ACUTE ONLY): 20 min Past Medical History: Past Medical History: Diagnosis Date  Abscess of muscle 08/10/2011  staph infection of right hip   Acute on chronic respiratory failure with hypoxia ([name])   Acute respiratory distress syndrome (ARDS) due to COVID-19 virus ([place])   Anxiety   Diabetes mellitus without complication ([name])   GERD (gastroesophageal reflux disease)   rare reflux - no meds for reflux - NO PROBLEM IN PAST SEVERAL YRS  HCAP (healthcare-associated pneumonia)   Hip dysplasia, congenital   no surgery as a child for hip dysplasia - has had bilateral hip replacements as an adult  Hodgkin lymphoma ([place])   Hypertension   Pancreatitis   Pneumothorax, acute   Postoperative anemia due to acute blood loss 09/07/2012  Septic arthritis of hip ([place]) 09/05/2012  PT'S TOTAL HIP JOINT REMOVED - ANTIBIOTIC SPACE PLACED AND PT HAS FINISHED IV ANTIBIOTICS ( PICC LINE REMOVED)  Sleep apnea   USES CPAP Past Surgical History: Past Surgical History: Procedure Laterality Date  COLONOSCOPY  04/26/2007  HERNIA REPAIR    inguinal hernia x3  IR GASTROSTOMY TUBE MOD SED  08/22/2019  IR IMAGING GUIDED PORT INSERTION  03/29/2019  JOINT REPLACEMENT  2002 & 2007  bilateral hip  replacement  LYMPH NODE BIOPSY Right 03/20/2019  Procedure: DEEP RIGHT SUPRACLAVICULAR LYMPH NODE EXCISION;  Surgeon: Fanny Skates, MD;  Location: [facility];  Service: General;  Laterality: Right;  MULTIPLE EXTRACTIONS WITH ALVEOLOPLASTY  07/28/2011  Procedure: MULTIPLE EXTRACION WITH ALVEOLOPLASTY;  Surgeon: Lenn Cal, DDS;  Location: [facility];  Service: Oral Surgery;  Laterality: N/A;  Extraction of tooth #'s 2,3,4,5,6,11,12,13,15,19,22 with alveoloplasty.  shoulder repair - right for separation of shoulder    TEE WITHOUT CARDIOVERSION  07/29/2011  Procedure: TRANSESOPHAGEAL ECHOCARDIOGRAM (TEE);  Surgeon: Josue Hector, MD;  Location: [facility]  ENDOSCOPY;  Service: Cardiovascular;  Laterality: N/A;  TOTAL HIP REVISION Right 09/05/2012  Procedure: RIGHT HIP RESECTION ARTHROPLASTY WITH ANTIBIOTIC SPACERS;  Surgeon: [name], MD;  Location: [facility];  Service: Orthopedics;  Laterality: Right;  TOTAL HIP REVISION Right 11/30/2012  Procedure: RIGHT TOTAL HIP ARTHROPLASTY REIMPLANTATION;  Surgeon: [name], MD;  Location: [facility];  Service: Orthopedics;  Laterality: Right; HPI: Patient is a 65 year old Caucasian  male with medical history significant of chronic tracheostomy after prolonged hospitalization in 2021 related with pneumonia due to QGBEE-10 complicated by ARDS and pulmonary fibrosis +/-pneumonitis from brentuximab, s/p PEG, HTN, DM type II, Hodgkin's lymphoma on chemotherapy and GERD.  Patient was discharged from this hospital on 08/25/2020.  Patient was said to have done very well for the first 3 weeks.  Over the last 5 days, patient has been failing to thrive, with associated poor appetite and malaise.  Readmitted with unchanged multifocal PNA.  Subjective: pt alert, eager to go home Assessment / Plan / Recommendation CHL IP CLINICAL IMPRESSIONS 09/20/2020 Clinical Impression Patient presents with excellent oropharyngeal swallowing function. Oral phase timely and with full clearance  of bolus. Pharyngeally, strength has improved from previous MBS 03/2020 in that patient has no pharyngeal residue post swallow. Intermittent trace penetration of thin liquids noted, primarily via straw, due to mildly decreased laryngeal closure noted, remaining above the vocal cords and clearing with either subsequent dry swallow or cued throat clear. Tested all consistencies both with and without PMSV in place with no notable difference in swallowing function. Educated patient and spouse (via phone per patient request) on results of testing and recommendations which include use of PMV if able (not necessary) to faciliate improved strength of cough/throat clear if needed. Both verbalized understanding. SLP will f/u briefly for tolerance and continued education as needed. Do not suspect that dysphagia is resulting in recurrent PNAs. Cannot however r/o contribution of PEG tube feedings on aspiration, particularly because patient is also taking pos by mouth in conjunction with tube feeds, and complains of feeling full often. Recommend dietary consult to ensure that patient is being fed via tube the correct amount and in the correct manner based on how much he consuming by mouth. SLP Visit Diagnosis Dysphagia, pharyngeal phase (R13.13) Attention and concentration deficit following -- Frontal lobe and executive function deficit following -- Impact on safety and function Mild aspiration risk   CHL IP TREATMENT RECOMMENDATION 09/20/2020 Treatment Recommendations Therapy as outlined in treatment plan below   Prognosis 08/23/2020 Prognosis for Safe Diet Advancement Good Barriers to Reach Goals -- Barriers/Prognosis Comment -- CHL IP DIET RECOMMENDATION 09/20/2020 SLP Diet Recommendations Regular solids;Thin liquid Liquid Administration via Cup;Straw Medication Administration Whole meds with liquid Compensations Slow rate;Small sips/bites Postural Changes Remain semi-upright after after feeds/meals (Comment);Seated upright at 90  degrees   CHL IP OTHER RECOMMENDATIONS 09/20/2020 Recommended Consults Other (Comment) Oral Care Recommendations Oral care BID Other Recommendations --   CHL IP FOLLOW UP RECOMMENDATIONS 09/20/2020 Follow up Recommendations None   CHL IP FREQUENCY AND DURATION 09/20/2020 Speech Therapy Frequency (ACUTE ONLY) min 1 x/week Treatment Duration 1 week      CHL IP ORAL PHASE 09/20/2020 Oral Phase WFL Oral - Pudding Teaspoon -- Oral - Pudding Cup -- Oral - Honey Teaspoon -- Oral - Honey Cup -- Oral - Nectar Teaspoon -- Oral - Nectar Cup -- Oral - Nectar Straw -- Oral - Thin Teaspoon -- Oral - Thin Cup -- Oral - Thin Straw -- Oral - Puree --  Oral - Mech Soft -- Oral - Regular -- Oral - Multi-Consistency -- Oral - Pill -- Oral Phase - Comment --  CHL IP PHARYNGEAL PHASE 09/20/2020 Pharyngeal Phase Impaired Pharyngeal- Pudding Teaspoon -- Pharyngeal -- Pharyngeal- Pudding Cup -- Pharyngeal -- Pharyngeal- Honey Teaspoon -- Pharyngeal -- Pharyngeal- Honey Cup -- Pharyngeal -- Pharyngeal- Nectar Teaspoon -- Pharyngeal -- Pharyngeal- Nectar Cup -- Pharyngeal -- Pharyngeal- Nectar Straw -- Pharyngeal -- Pharyngeal- Thin Teaspoon NT Pharyngeal -- Pharyngeal- Thin Cup WFL Pharyngeal -- Pharyngeal- Thin Straw Reduced laryngeal elevation;Penetration/Aspiration during swallow Pharyngeal Material enters airway, remains ABOVE vocal cords and not ejected out Pharyngeal- Puree WFL Pharyngeal -- Pharyngeal- Mechanical Soft WFL Pharyngeal -- Pharyngeal- Regular -- Pharyngeal -- Pharyngeal- Multi-consistency -- Pharyngeal -- Pharyngeal- Pill WFL Pharyngeal -- Pharyngeal Comment --  CHL IP CERVICAL ESOPHAGEAL PHASE 09/20/2020 Cervical Esophageal Phase WFL Pudding Teaspoon -- Pudding Cup -- Honey Teaspoon -- Honey Cup -- Nectar Teaspoon -- Nectar Cup -- Nectar Straw -- Thin Teaspoon -- Thin Cup -- Thin Straw -- Puree -- Mechanical Soft -- Regular -- Multi-consistency -- Pill -- Cervical Esophageal Comment -- McCoy Leah Meryl 09/20/2020, 9:32 AM               US Abdomen Limited RUQ (LIVER/GB)  Result Date: 09/19/2020 CLINICAL DATA:  Transaminitis. EXAM: ULTRASOUND ABDOMEN LIMITED RIGHT UPPER QUADRANT COMPARISON:  None. FINDINGS: Gallbladder: No gallbladder wall thickening. Small amount of biliary sludge. No sonographic Murphy sign noted by sonographer. Common bile duct: Diameter: 3 mm, normal Liver: No focal lesion identified. Within normal limits in parenchymal echogenicity. Portal vein is patent on color Doppler imaging with normal direction of blood flow towards the liver. Other: None. IMPRESSION: Biliary sludge without evidence of acute cholecystitis. Electronically Signed   By: Valentino Saxon MD   On: 09/19/2020 16:18        Scheduled Meds:  Chlorhexidine Gluconate Cloth  6 each Topical Daily   enoxaparin (LOVENOX) injection  40 mg Subcutaneous Q24H   hydrocortisone sod succinate (SOLU-CORTEF) inj  50 mg Intravenous Q8H   Continuous Infusions:  ceFEPime (MAXIPIME) IV 2 g (09/20/20 0933)     LOS: 1 day    Time spent: 35 minutes    Dana Allan, MD  [facility] Pager #: [phone] 7PM-7AM contact night coverage as above

## 2020-09-21 ENCOUNTER — Inpatient Hospital Stay (HOSPITAL_COMMUNITY): Payer: BC Managed Care – PPO

## 2020-09-21 DIAGNOSIS — Z930 Tracheostomy status: Secondary | ICD-10-CM

## 2020-09-21 DIAGNOSIS — J9611 Chronic respiratory failure with hypoxia: Secondary | ICD-10-CM | POA: Diagnosis not present

## 2020-09-21 DIAGNOSIS — R7881 Bacteremia: Secondary | ICD-10-CM

## 2020-09-21 DIAGNOSIS — R6251 Failure to thrive (child): Secondary | ICD-10-CM | POA: Diagnosis not present

## 2020-09-21 DIAGNOSIS — R5381 Other malaise: Secondary | ICD-10-CM

## 2020-09-21 DIAGNOSIS — R042 Hemoptysis: Secondary | ICD-10-CM

## 2020-09-21 DIAGNOSIS — R5383 Other fatigue: Secondary | ICD-10-CM

## 2020-09-21 DIAGNOSIS — E875 Hyperkalemia: Secondary | ICD-10-CM

## 2020-09-21 DIAGNOSIS — U099 Post covid-19 condition, unspecified: Secondary | ICD-10-CM

## 2020-09-21 DIAGNOSIS — C8190 Hodgkin lymphoma, unspecified, unspecified site: Secondary | ICD-10-CM

## 2020-09-21 DIAGNOSIS — C8198 Hodgkin lymphoma, unspecified, lymph nodes of multiple sites: Secondary | ICD-10-CM

## 2020-09-21 DIAGNOSIS — J984 Other disorders of lung: Secondary | ICD-10-CM

## 2020-09-21 DIAGNOSIS — J1282 Pneumonia due to coronavirus disease 2019: Secondary | ICD-10-CM

## 2020-09-21 LAB — URINE CULTURE

## 2020-09-21 LAB — ECHOCARDIOGRAM COMPLETE
Area-P 1/2: 5.19 cm2
Calc EF: 76.4 %
S' Lateral: 2.3 cm
Single Plane A2C EF: 66.1 %
Single Plane A4C EF: 76 %
Weight: 2232.82 oz

## 2020-09-21 LAB — GLUCOSE, CAPILLARY: Glucose-Capillary: 208 mg/dL — ABNORMAL HIGH (ref 70–99)

## 2020-09-21 LAB — LEGIONELLA PNEUMOPHILA SEROGP 1 UR AG: L. pneumophila Serogp 1 Ur Ag: NEGATIVE

## 2020-09-21 MED ORDER — ADULT MULTIVITAMIN LIQUID CH
15.0000 mL | Freq: Every day | ORAL | Status: DC
  Administered 2020-09-21 – 2020-10-15 (×24): 15 mL
  Filled 2020-09-21 (×26): qty 15

## 2020-09-21 MED ORDER — PRO-STAT SUGAR FREE PO LIQD
30.0000 mL | Freq: Every day | ORAL | Status: DC
  Filled 2020-09-21: qty 30

## 2020-09-21 MED ORDER — VITAMIN D3 25 MCG (1000 UNIT) PO TABS
10000.0000 [IU] | ORAL_TABLET | Freq: Every day | ORAL | Status: DC
  Administered 2020-09-24: 10000 [IU] via ORAL
  Filled 2020-09-21 (×4): qty 10

## 2020-09-21 MED ORDER — LORAZEPAM 0.5 MG PO TABS
0.5000 mg | ORAL_TABLET | Freq: Four times a day (QID) | ORAL | Status: DC | PRN
  Administered 2020-09-23 (×2): 0.5 mg via ORAL
  Filled 2020-09-21 (×2): qty 1

## 2020-09-21 MED ORDER — OSMOLITE 1.2 CAL PO LIQD
1000.0000 mL | ORAL | Status: AC
  Administered 2020-09-21 – 2020-09-25 (×4): 1000 mL
  Filled 2020-09-21 (×2): qty 1000

## 2020-09-21 MED ORDER — PANTOPRAZOLE SODIUM 40 MG PO PACK
40.0000 mg | PACK | Freq: Every day | ORAL | Status: DC
  Administered 2020-09-21 – 2020-10-15 (×24): 40 mg
  Filled 2020-09-21 (×26): qty 20

## 2020-09-21 MED ORDER — INSULIN ASPART 100 UNIT/ML IJ SOLN
0.0000 [IU] | Freq: Three times a day (TID) | INTRAMUSCULAR | Status: DC
  Administered 2020-09-22: 7 [IU] via SUBCUTANEOUS
  Administered 2020-09-22: 2 [IU] via SUBCUTANEOUS
  Administered 2020-09-23 (×2): 5 [IU] via SUBCUTANEOUS
  Administered 2020-09-23 – 2020-09-24 (×2): 3 [IU] via SUBCUTANEOUS
  Administered 2020-09-24: 5 [IU] via SUBCUTANEOUS
  Administered 2020-09-24: 7 [IU] via SUBCUTANEOUS

## 2020-09-21 MED ORDER — SERTRALINE HCL 50 MG PO TABS
50.0000 mg | ORAL_TABLET | Freq: Every day | ORAL | Status: DC
  Administered 2020-09-21 – 2020-09-24 (×3): 50 mg via ORAL
  Filled 2020-09-21 (×3): qty 1

## 2020-09-21 MED ORDER — GUAIFENESIN 100 MG/5ML PO SOLN
5.0000 mL | ORAL | Status: DC | PRN
  Administered 2020-09-29 – 2020-09-30 (×2): 100 mg
  Filled 2020-09-21 (×2): qty 10

## 2020-09-21 MED ORDER — MIDODRINE HCL 5 MG PO TABS
5.0000 mg | ORAL_TABLET | Freq: Three times a day (TID) | ORAL | Status: DC
  Administered 2020-09-22 – 2020-09-25 (×9): 5 mg via ORAL
  Filled 2020-09-21 (×10): qty 1

## 2020-09-21 MED ORDER — HYOSCYAMINE SULFATE 0.125 MG/ML PO SOLN
0.1250 mg | ORAL | Status: DC | PRN
  Filled 2020-09-21: qty 1

## 2020-09-21 MED ORDER — ALBUTEROL SULFATE (2.5 MG/3ML) 0.083% IN NEBU
2.5000 mg | INHALATION_SOLUTION | RESPIRATORY_TRACT | Status: DC | PRN
  Administered 2020-09-24 – 2020-09-30 (×9): 2.5 mg via RESPIRATORY_TRACT
  Filled 2020-09-21 (×9): qty 3

## 2020-09-21 MED ORDER — PROSOURCE TF PO LIQD
45.0000 mL | Freq: Two times a day (BID) | ORAL | Status: DC
  Administered 2020-09-22 – 2020-09-25 (×6): 45 mL
  Filled 2020-09-21 (×7): qty 45

## 2020-09-21 MED ORDER — TRAZODONE HCL 50 MG PO TABS
100.0000 mg | ORAL_TABLET | Freq: Every day | ORAL | Status: DC
  Administered 2020-09-21 – 2020-10-15 (×25): 100 mg
  Filled 2020-09-21 (×25): qty 2

## 2020-09-21 MED ORDER — INSULIN ASPART 100 UNIT/ML IJ SOLN
0.0000 [IU] | Freq: Every day | INTRAMUSCULAR | Status: DC
  Administered 2020-09-21: 2 [IU] via SUBCUTANEOUS
  Administered 2020-09-22: 5 [IU] via SUBCUTANEOUS
  Administered 2020-09-23: 3 [IU] via SUBCUTANEOUS

## 2020-09-21 NOTE — Progress Notes (Signed)
PROGRESS NOTE    Steve [name]  GXQ:[id number] DOB: Dec 20, 1955 DOA: 09/19/2020 PCP: [name], PA-C   Brief Narrative:  Patient is a 65 year old Caucasian  male with medical history significant of chronic tracheostomy after prolonged hospitalization in 2021 related with pneumonia due to [id number] complicated by ARDS and pulmonary fibrosis +/-pneumonitis from brentuximab, s/p PEG, HTN, DM type II, Hodgkin's lymphoma on chemotherapy and GERD was recently discharged from our hospital on 08/25/2020.  For 3 weeks he had done good but 5 days prior to presentation patient started having poor appetite malaise and failure to thrive with increased concern for pneumonia.  Patient then presented to hospital.  Patient does have baseline x-ray abnormalities from previous COVID so it was difficult to see the change.  Patient was started empirically on vancomycin and cefepime and was admitted to hospital.  Oncology team was consulted as well.   Assessment & Plan:   Active Problems:   Failure to thrive (child)  Possible pneumonia: Patient has history of chronic respiratory failure on 8 L of oxygen at baseline status post tracheostomy.  History of recurrent pneumonia with prior history of COVID and pulmonary fibrosis.  Patient was on prolonged ventilation for almost 8 months last year.  Chest x-ray this time shows possible recurrent pneumonia but difficult to interpret.  On IV vancomycin and cefepime.  CT scan of the chest was performed which showed interval increase in bulk of adenopathy in the metastasis consistent with known lymphoma with increased axillary lymph nodes.  Unchanged left pleural fluid and fibrosis.  Leukocytosis at 24.9.  Seen by speech therapy and has mild aspiration risk.  Recommended regular solids with thin liquids.  Staph epidermidis bacteremia.  Both bottles are positive.  Likely true bacteremia.  Continue with vancomycin.  Infectious disease has been consulted.  Patient does have a  Port-A-Cath.  Will follow ID recommendations.  Failure to thrive: With significant anorexia from advanced lymphoma.  Patient has Hodgkin's lymphoma.  Chronic respiratory failure/pulmonary fibrosis: -History of COVID, intubation for very long time (8 months) and tracheostomy. Patient uses 8 L of oxygen at home with tracheostomy collar.  Currently needing 10 L/min saturating 95%..  Hodgkin's lymphoma: CT scan concerning for worsening lymphadenopathy and progression.  Oncology on board.  Management as per oncology.   Diabetes mellitus: Continue sliding-scale insulin, Accu-Cheks, diabetic diet.  Severe protein calorie malnutrition: Present on admission.  Nutritional supplements as per dietary services.    Hyponatremia: Mild.  Possible SIADH.  Continue to monitor.    DVT prophylaxis: Subcutaneous Lovenox  Code Status: Full code  Family Communication:  None.  Disposition Plan:  Status is: Inpatient   Remains inpatient appropriate because:IV treatments appropriate due to intensity of illness or inability to take PO and Inpatient level of care appropriate due to severity of illness, bacteremia   Dispo: The patient is from: Home              Anticipated d/c is to: Home PT.  We will get PT evaluation.              Patient currently is not medically stable to d/c.              Difficult to place patient No   Consultants:  Hematology/oncology   Procedures:  None.  Antimicrobials:   IV vancomycin  Intravenous cefepime.   Subjective: Patient was seen and examined at bedside.  He feels weak with some shortness of breath.  Denies any pain, nausea, vomiting.  Objective:  Vitals:   09/21/20 1139 09/21/20 1238 09/21/20 1312 09/21/20 1350  BP:   103/66   Pulse:   94   Resp:      Temp:   98 F (36.7 C)   TempSrc:   Oral   SpO2: 91% 95% 100% 95%  Weight:        Intake/Output Summary (Last 24 hours) at 09/21/2020 1558 Last data filed at 09/21/2020 1353 Gross per 24 hour   Intake --  Output 1025 ml  Net -1025 ml    Filed Weights   09/19/20 1930  Weight: 63.3 kg    Physical examination:  General: Cachectic built, not in obvious distress, chronically ill and deconditioned. HENT:   No scleral pallor or icterus noted. Oral mucosa is moist.  Tracheostomy collar in place with supplemental oxygen Chest: Diminished breath sounds bilaterally. CVS: S1 &S2 heard. No murmur.  Regular rate and rhythm. Abdomen: Soft, nontender, nondistended.  Bowel sounds are heard.  PEG tube in place. Extremities: No cyanosis, clubbing or edema.  Peripheral pulses are palpable. Psych: Alert, awake and oriented, normal mood CNS:  No cranial nerve deficits.  Power equal in all extremities.   Skin: Warm and dry.  No rashes noted.   Data Reviewed: I have personally reviewed following labs and imaging studies  CBC: Recent Labs  Lab 09/18/20 1212 09/19/20 1511 09/20/20 1015  WBC 18.6 Repeated and verified X2.* 19.0* 24.9*  NEUTROABS 16.5* 15.1* 21.8*  HGB 10.8* 10.2* 10.2*  HCT 33.9* 33.1* 33.3*  MCV 90.6 93.2 93.5  PLT 384.0 313 948    Basic Metabolic Panel: Recent Labs  Lab 09/18/20 1212 09/19/20 1511 09/20/20 0708  NA 135 131* 135  K 5.9 No hemolysis seen* 4.4 4.7  CL 82* 81* 87*  CO2 49* 40* 43*  GLUCOSE 391* 223* 254*  BUN 24* 23 25*  CREATININE 0.53 0.43* 0.55*  CALCIUM 9.9 9.2 9.6  MG  --   --  1.9    GFR: Estimated Creatinine Clearance: 81.1 mL/min (A) (by C-G formula based on SCr of 0.55 mg/dL (L)). Liver Function Tests: Recent Labs  Lab 09/18/20 1212 09/19/20 1511 09/20/20 0708  AST 17 15 15   ALT 34 28 30  ALKPHOS 113 83 82  BILITOT 0.6 0.6 0.8  PROT 6.5 6.3* 6.7  ALBUMIN 3.0* 2.3* 2.2*    No results for input(s): LIPASE, AMYLASE in the last 168 hours. No results for input(s): AMMONIA in the last 168 hours. Coagulation Profile: No results for input(s): INR, PROTIME in the last 168 hours. Cardiac Enzymes: Recent Labs  Lab  09/19/20 1511  CKTOTAL 11*    BNP (last 3 results) No results for input(s): PROBNP in the last 8760 hours. HbA1C: No results for input(s): HGBA1C in the last 72 hours.  CBG: No results for input(s): GLUCAP in the last 168 hours. Lipid Profile: No results for input(s): CHOL, HDL, LDLCALC, TRIG, CHOLHDL, LDLDIRECT in the last 72 hours. Thyroid Function Tests: Recent Labs    09/20/20 0708  TSH 0.862    Anemia Panel: No results for input(s): VITAMINB12, FOLATE, FERRITIN, TIBC, IRON, RETICCTPCT in the last 72 hours.  Urine analysis:    Component Value Date/Time   COLORURINE YELLOW 09/19/2020 1511   APPEARANCEUR HAZY (A) 09/19/2020 1511   LABSPEC 1.012 09/19/2020 1511   PHURINE 7.0 09/19/2020 1511   GLUCOSEU NEGATIVE 09/19/2020 1511   HGBUR NEGATIVE 09/19/2020 1511   HGBUR negative 03/23/2007 0000   BILIRUBINUR NEGATIVE 09/19/2020 1511   BILIRUBINUR  neg 11/11/2016 1017   KETONESUR NEGATIVE 09/19/2020 1511   PROTEINUR NEGATIVE 09/19/2020 1511   UROBILINOGEN 0.2 11/11/2016 1017   UROBILINOGEN 0.2 11/08/2012 0915   NITRITE NEGATIVE 09/19/2020 1511   LEUKOCYTESUR NEGATIVE 09/19/2020 1511   Sepsis Labs: @LABRCNTIP (procalcitonin:4,lacticidven:4)  ) Recent Results (from the past 240 hour(s))  Urine culture     Status: Abnormal   Collection Time: 09/19/20  3:11 PM   Specimen: Urine, Clean Catch  Result Value Ref Range Status   Specimen Description   Final    URINE, CLEAN CATCH Performed at [facility], [place] [address] [place], [place] [address]    Special Requests   Final    NONE Performed at [facility], [place] [address] [place], [place] [address]    Culture MULTIPLE SPECIES PRESENT, SUGGEST RECOLLECTION (A)  Final   Report Status 09/21/2020 FINAL  Final  Culture, blood (routine x 2)     Status: Abnormal (Preliminary result)   Collection Time: 09/19/20  3:11 PM   Specimen: BLOOD  Result Value Ref Range Status   Specimen  Description   Final    BLOOD PORTA CATH Performed at [place] [address], [place], [place] [address]    Special Requests   Final    BOTTLES DRAWN AEROBIC AND ANAEROBIC Blood Culture results may not be optimal due to an excessive volume of blood received in culture bottles Performed at [place] [address], [place], [place] [address]    Culture  Setup Time   Final    GRAM POSITIVE COCCI IN BOTH AEROBIC AND ANAEROBIC BOTTLES CRITICAL RESULT CALLED TO, READ BACK BY AND VERIFIED WITH: M,SWAYNE PHARMD @1552  09/20/20 EB    Culture (A)  Final    STAPHYLOCOCCUS EPIDERMIDIS SUSCEPTIBILITIES TO FOLLOW Performed at [facility], [address], [place], [place] [address]    Report Status PENDING  Incomplete  Blood Culture ID Panel (Reflexed)     Status: Abnormal   Collection Time: 09/19/20  3:11 PM  Result Value Ref Range Status   Enterococcus faecalis NOT DETECTED NOT DETECTED Final   Enterococcus Faecium NOT DETECTED NOT DETECTED Final   Listeria monocytogenes NOT DETECTED NOT DETECTED Final   Staphylococcus species DETECTED (A) NOT DETECTED Final    Comment: CRITICAL RESULT CALLED TO, READ BACK BY AND VERIFIED WITH: M,SWAYNE PHARMD @1552  09/20/20 EB    Staphylococcus aureus (BCID) NOT DETECTED NOT DETECTED Final   Staphylococcus epidermidis DETECTED (A) NOT DETECTED Final    Comment: Methicillin (oxacillin) resistant coagulase negative staphylococcus. Possible blood culture contaminant (unless isolated from more than one blood culture draw or clinical case suggests pathogenicity). No antibiotic treatment is indicated for blood  culture contaminants. CRITICAL RESULT CALLED TO, READ BACK BY AND VERIFIED WITH: M,SWAYNE PHARMD @1552  09/20/20 EB    Staphylococcus lugdunensis NOT DETECTED NOT DETECTED Final   Streptococcus species NOT DETECTED NOT DETECTED Final   Streptococcus agalactiae NOT DETECTED NOT DETECTED Final    Streptococcus pneumoniae NOT DETECTED NOT DETECTED Final   Streptococcus pyogenes NOT DETECTED NOT DETECTED Final   A.calcoaceticus-baumannii NOT DETECTED NOT DETECTED Final   Bacteroides fragilis NOT DETECTED NOT DETECTED Final   Enterobacterales NOT DETECTED NOT DETECTED Final   Enterobacter cloacae complex NOT DETECTED NOT DETECTED Final   Escherichia coli NOT DETECTED NOT DETECTED Final   Klebsiella aerogenes NOT DETECTED NOT DETECTED Final   Klebsiella oxytoca NOT DETECTED NOT DETECTED Final   Klebsiella pneumoniae NOT  DETECTED NOT DETECTED Final   Proteus species NOT DETECTED NOT DETECTED Final   Salmonella species NOT DETECTED NOT DETECTED Final   Serratia marcescens NOT DETECTED NOT DETECTED Final   Haemophilus influenzae NOT DETECTED NOT DETECTED Final   Neisseria meningitidis NOT DETECTED NOT DETECTED Final   Pseudomonas aeruginosa NOT DETECTED NOT DETECTED Final   Stenotrophomonas maltophilia NOT DETECTED NOT DETECTED Final   Candida albicans NOT DETECTED NOT DETECTED Final   Candida auris NOT DETECTED NOT DETECTED Final   Candida glabrata NOT DETECTED NOT DETECTED Final   Candida krusei NOT DETECTED NOT DETECTED Final   Candida parapsilosis NOT DETECTED NOT DETECTED Final   Candida tropicalis NOT DETECTED NOT DETECTED Final   Cryptococcus neoformans/gattii NOT DETECTED NOT DETECTED Final   Methicillin resistance mecA/C DETECTED (A) NOT DETECTED Final    Comment: CRITICAL RESULT CALLED TO, READ BACK BY AND VERIFIED WITH: M,SWAYNE PHARMD @1552  09/20/20 EB Performed at [facility], [address] [place], [place] [address]   Culture, blood (routine x 2)     Status: Abnormal (Preliminary result)   Collection Time: 09/19/20  3:16 PM   Specimen: BLOOD  Result Value Ref Range Status   Specimen Description   Final    BLOOD SITE NOT SPECIFIED Performed at [place] [address], [place], [place] [address]    Special Requests   Final    BOTTLES  DRAWN AEROBIC AND ANAEROBIC Blood Culture adequate volume Performed at [place] [address] [place], [place] [address]    Culture  Setup Time   Final    GRAM POSITIVE COCCI IN BOTH AEROBIC AND ANAEROBIC BOTTLES CRITICAL VALUE NOTED.  VALUE IS CONSISTENT WITH PREVIOUSLY REPORTED AND CALLED VALUE. Performed at [facility], [place] [address], [place], [place] [address]    Culture STAPHYLOCOCCUS EPIDERMIDIS (A)  Final   Report Status PENDING  Incomplete  Resp Panel by RT-PCR (Flu A&B, Covid) Nasopharyngeal Swab     Status: None   Collection Time: 09/19/20  3:49 PM   Specimen: Nasopharyngeal Swab; Nasopharyngeal(NP) swabs in vial transport medium  Result Value Ref Range Status   SARS Coronavirus 2 by RT PCR NEGATIVE NEGATIVE Final    Comment: (NOTE) SARS-CoV-2 target nucleic acids are NOT DETECTED.  The SARS-CoV-2 RNA is generally detectable in upper respiratory specimens during the acute phase of infection. The lowest concentration of SARS-CoV-2 viral copies this assay can detect is 138 copies/mL. A negative result does not preclude SARS-Cov-2 infection and should not be used as the sole basis for treatment or other patient management decisions. A negative result may occur with  improper specimen collection/handling, submission of specimen other than nasopharyngeal swab, presence of viral mutation(s) within the areas targeted by this assay, and inadequate number of viral copies(<138 copies/mL). A negative result must be combined with clinical observations, patient history, and epidemiological information. The expected result is Negative.  Fact Sheet for Patients:  EntrepreneurPulse.com.au  Fact Sheet for Healthcare Providers:  IncredibleEmployment.be  This test is no t yet approved or cleared by the [place] FDA and  has been authorized for detection and/or diagnosis of SARS-CoV-2 by FDA under an Emergency Use  Authorization (EUA). This EUA will remain  in effect (meaning this test can be used) for the duration of the COVID-19 declaration under Section 564(b)(1) of the Act, 21 U.S.C.section 360bbb-3(b)(1), unless the authorization is terminated  or revoked sooner.       Influenza A by  PCR NEGATIVE NEGATIVE Final   Influenza B by PCR NEGATIVE NEGATIVE Final    Comment: (NOTE) The Xpert Xpress SARS-CoV-2/FLU/RSV plus assay is intended as an aid in the diagnosis of influenza from Nasopharyngeal swab specimens and should not be used as a sole basis for treatment. Nasal washings and aspirates are unacceptable for Xpert Xpress SARS-CoV-2/FLU/RSV testing.  Fact Sheet for Patients: EntrepreneurPulse.com.au  Fact Sheet for Healthcare Providers: IncredibleEmployment.be  This test is not yet approved or cleared by the [place] FDA and has been authorized for detection and/or diagnosis of SARS-CoV-2 by FDA under an Emergency Use Authorization (EUA). This EUA will remain in effect (meaning this test can be used) for the duration of the COVID-19 declaration under Section 564(b)(1) of the Act, 21 U.S.C. section 360bbb-3(b)(1), unless the authorization is terminated or revoked.  Performed at [facility], [place] [address], [place], [place] [address]   MRSA PCR Screening     Status: None   Collection Time: 09/20/20  6:50 AM   Specimen: Nasopharyngeal  Result Value Ref Range Status   MRSA by PCR NEGATIVE NEGATIVE Final    Comment:        The GeneXpert MRSA Assay (FDA approved for NASAL specimens only), is one component of a comprehensive MRSA colonization surveillance program. It is not intended to diagnose MRSA infection nor to guide or monitor treatment for MRSA infections. Performed at [facility], [place] [address], [place], [place] [address]          Radiology Studies: CT CHEST WO CONTRAST  Result Date:  09/20/2020 CLINICAL DATA:  Respiratory failure.  History of Hodgkin's lymphoma. EXAM: CT CHEST WITHOUT CONTRAST TECHNIQUE: Multidetector CT imaging of the chest was performed following the standard protocol without IV contrast. COMPARISON:  July 21, 2020 FINDINGS: Cardiovascular: Heart is mildly enlarged. LEFT chest port tip terminates at the superior cavoatrial junction. No pericardial effusion. Minimal atherosclerotic calcifications. Enlarged pulmonary artery as can be seen in pulmonary arterial hypertension. Mediastinum/Nodes: Revisualization of extensive adenopathy. LEFT level 3 axillary lymph node measures 11 mm, previously 3 mm (series 2, image 42). LEFT supraclavicular lymph node measures 15 mm, previously 15 mm (series 2, image 24). RIGHT level 1 axillary lymph node measures 8 mm, previously 4 mm (series 2, image 63). RIGHT internal mammary lymph node measures 31 mm, previously 26 mm (series 2, image 77). RIGHT paratracheal conglomeration spanned 59 x 43 mm, previously 44 by 30 mm (series 2, image 45). There is a lunate configuration of the trachea. Lungs/Pleura: Visualization of a peripheral rind of fluid adjacent to the RIGHT lung, similar in comparison to prior. Extensive bilateral ground-glass opacities with subpleural reticulation and suggestion of honeycombing. Peribronchovascular nodularity is similar in comparison to prior. Similar appearance of RIGHT middle lobe atelectasis. Upper Abdomen: No acute abnormality. Musculoskeletal: Known osseous metastatic disease is not well visualized. Limited evaluation of the ribs secondary to respiratory motion. No acute thoracic spine fracture. IMPRESSION: 1. Interval increase in bulky adenopathy within the mediastinum consistent with known lymphoma. Increased axillary lymph node size in comparison to prior. 2. Revisualization of sequela of advanced pulmonary fibrosis. Unchanged appearance of peribronchovascular nodularity. This could reflect lymphangitic spread  of lymphoma versus underlying infection. 3. Unchanged small rind of RIGHT-sided pleural fluid. Underlying pleural nodularity from lymphoma remains in the differential. 4. 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 5. Enlarged pulmonary artery as can be seen in pulmonary arterial hypertension. Electronically Signed   By: Valentino Saxon  MD   On: 09/20/2020 17:25   DG Swallowing Func-Speech Pathology  Result Date: 09/20/2020 Formatting of this result is different from the original. Objective Swallowing Evaluation: Type of Study: MBS-Modified Barium Swallow Study  Patient Details Name: Steve [name] MRN: [id number] Date of Birth: [birth year] Today's Date: 09/20/2020 Time: SLP Start Time (ACUTE ONLY): 0903 -SLP Stop Time (ACUTE ONLY): 2947 SLP Time Calculation (min) (ACUTE ONLY): 20 min Past Medical History: Past Medical History: Diagnosis Date  Abscess of muscle 08/10/2011  staph infection of right hip   Acute on chronic respiratory failure with hypoxia ([name])   Acute respiratory distress syndrome (ARDS) due to COVID-19 virus ([place])   Anxiety   Diabetes mellitus without complication ([name])   GERD (gastroesophageal reflux disease)   rare reflux - no meds for reflux - NO PROBLEM IN PAST SEVERAL YRS  HCAP (healthcare-associated pneumonia)   Hip dysplasia, congenital   no surgery as a child for hip dysplasia - has had bilateral hip replacements as an adult  Hodgkin lymphoma ([place])   Hypertension   Pancreatitis   Pneumothorax, acute   Postoperative anemia due to acute blood loss 09/07/2012  Septic arthritis of hip ([place]) 09/05/2012  PT'S TOTAL HIP JOINT REMOVED - ANTIBIOTIC SPACE PLACED AND PT HAS FINISHED IV ANTIBIOTICS ( PICC LINE REMOVED)  Sleep apnea   USES CPAP Past Surgical History: Past Surgical History: Procedure Laterality Date  COLONOSCOPY  04/26/2007  HERNIA REPAIR    inguinal hernia x3  IR GASTROSTOMY TUBE MOD SED  08/22/2019  IR IMAGING GUIDED PORT INSERTION  03/29/2019   JOINT REPLACEMENT  2002 & 2007  bilateral hip replacement  LYMPH NODE BIOPSY Right 03/20/2019  Procedure: DEEP RIGHT SUPRACLAVICULAR LYMPH NODE EXCISION;  Surgeon: Fanny Skates, MD;  Location: [facility];  Service: General;  Laterality: Right;  MULTIPLE EXTRACTIONS WITH ALVEOLOPLASTY  07/28/2011  Procedure: MULTIPLE EXTRACION WITH ALVEOLOPLASTY;  Surgeon: Lenn Cal, DDS;  Location: [facility];  Service: Oral Surgery;  Laterality: N/A;  Extraction of tooth #'s 2,3,4,5,6,11,12,13,15,19,22 with alveoloplasty.  shoulder repair - right for separation of shoulder    TEE WITHOUT CARDIOVERSION  07/29/2011  Procedure: TRANSESOPHAGEAL ECHOCARDIOGRAM (TEE);  Surgeon: Josue Hector, MD;  Location: [facility];  Service: Cardiovascular;  Laterality: N/A;  TOTAL HIP REVISION Right 09/05/2012  Procedure: RIGHT HIP RESECTION ARTHROPLASTY WITH ANTIBIOTIC SPACERS;  Surgeon: [name], MD;  Location: [facility];  Service: Orthopedics;  Laterality: Right;  TOTAL HIP REVISION Right 11/30/2012  Procedure: RIGHT TOTAL HIP ARTHROPLASTY REIMPLANTATION;  Surgeon: [name], MD;  Location: [facility];  Service: Orthopedics;  Laterality: Right; HPI: Patient is a 65 year old Caucasian  male with medical history significant of chronic tracheostomy after prolonged hospitalization in 2021 related with pneumonia due to MLYYT-03 complicated by ARDS and pulmonary fibrosis +/-pneumonitis from brentuximab, s/p PEG, HTN, DM type II, Hodgkin's lymphoma on chemotherapy and GERD.  Patient was discharged from this hospital on 08/25/2020.  Patient was said to have done very well for the first 3 weeks.  Over the last 5 days, patient has been failing to thrive, with associated poor appetite and malaise.  Readmitted with unchanged multifocal PNA.  Subjective: pt alert, eager to go home Assessment / Plan / Recommendation CHL IP CLINICAL IMPRESSIONS 09/20/2020 Clinical Impression Patient presents with excellent oropharyngeal swallowing  function. Oral phase timely and with full clearance of bolus. Pharyngeally, strength has improved from previous MBS 03/2020 in that patient has no pharyngeal residue post swallow. Intermittent trace penetration of  thin liquids noted, primarily via straw, due to mildly decreased laryngeal closure noted, remaining above the vocal cords and clearing with either subsequent dry swallow or cued throat clear. Tested all consistencies both with and without PMSV in place with no notable difference in swallowing function. Educated patient and spouse (via phone per patient request) on results of testing and recommendations which include use of PMV if able (not necessary) to faciliate improved strength of cough/throat clear if needed. Both verbalized understanding. SLP will f/u briefly for tolerance and continued education as needed. Do not suspect that dysphagia is resulting in recurrent PNAs. Cannot however r/o contribution of PEG tube feedings on aspiration, particularly because patient is also taking pos by mouth in conjunction with tube feeds, and complains of feeling full often. Recommend dietary consult to ensure that patient is being fed via tube the correct amount and in the correct manner based on how much he consuming by mouth. SLP Visit Diagnosis Dysphagia, pharyngeal phase (R13.13) Attention and concentration deficit following -- Frontal lobe and executive function deficit following -- Impact on safety and function Mild aspiration risk   CHL IP TREATMENT RECOMMENDATION 09/20/2020 Treatment Recommendations Therapy as outlined in treatment plan below   Prognosis 08/23/2020 Prognosis for Safe Diet Advancement Good Barriers to Reach Goals -- Barriers/Prognosis Comment -- CHL IP DIET RECOMMENDATION 09/20/2020 SLP Diet Recommendations Regular solids;Thin liquid Liquid Administration via Cup;Straw Medication Administration Whole meds with liquid Compensations Slow rate;Small sips/bites Postural Changes Remain semi-upright  after after feeds/meals (Comment);Seated upright at 90 degrees   CHL IP OTHER RECOMMENDATIONS 09/20/2020 Recommended Consults Other (Comment) Oral Care Recommendations Oral care BID Other Recommendations --   CHL IP FOLLOW UP RECOMMENDATIONS 09/20/2020 Follow up Recommendations None   CHL IP FREQUENCY AND DURATION 09/20/2020 Speech Therapy Frequency (ACUTE ONLY) min 1 x/week Treatment Duration 1 week      CHL IP ORAL PHASE 09/20/2020 Oral Phase WFL Oral - Pudding Teaspoon -- Oral - Pudding Cup -- Oral - Honey Teaspoon -- Oral - Honey Cup -- Oral - Nectar Teaspoon -- Oral - Nectar Cup -- Oral - Nectar Straw -- Oral - Thin Teaspoon -- Oral - Thin Cup -- Oral - Thin Straw -- Oral - Puree -- Oral - Mech Soft -- Oral - Regular -- Oral - Multi-Consistency -- Oral - Pill -- Oral Phase - Comment --  CHL IP PHARYNGEAL PHASE 09/20/2020 Pharyngeal Phase Impaired Pharyngeal- Pudding Teaspoon -- Pharyngeal -- Pharyngeal- Pudding Cup -- Pharyngeal -- Pharyngeal- Honey Teaspoon -- Pharyngeal -- Pharyngeal- Honey Cup -- Pharyngeal -- Pharyngeal- Nectar Teaspoon -- Pharyngeal -- Pharyngeal- Nectar Cup -- Pharyngeal -- Pharyngeal- Nectar Straw -- Pharyngeal -- Pharyngeal- Thin Teaspoon NT Pharyngeal -- Pharyngeal- Thin Cup WFL Pharyngeal -- Pharyngeal- Thin Straw Reduced laryngeal elevation;Penetration/Aspiration during swallow Pharyngeal Material enters airway, remains ABOVE vocal cords and not ejected out Pharyngeal- Puree WFL Pharyngeal -- Pharyngeal- Mechanical Soft WFL Pharyngeal -- Pharyngeal- Regular -- Pharyngeal -- Pharyngeal- Multi-consistency -- Pharyngeal -- Pharyngeal- Pill WFL Pharyngeal -- Pharyngeal Comment --  CHL IP CERVICAL ESOPHAGEAL PHASE 09/20/2020 Cervical Esophageal Phase WFL Pudding Teaspoon -- Pudding Cup -- Honey Teaspoon -- Honey Cup -- Nectar Teaspoon -- Nectar Cup -- Nectar Straw -- Thin Teaspoon -- Thin Cup -- Thin Straw -- Puree -- Mechanical Soft -- Regular -- Multi-consistency -- Pill -- Cervical  Esophageal Comment -- McCoy Leah Meryl 09/20/2020, 9:32 AM              US Abdomen Limited RUQ (LIVER/GB)  Result Date: 09/19/2020  CLINICAL DATA:  Transaminitis. EXAM: ULTRASOUND ABDOMEN LIMITED RIGHT UPPER QUADRANT COMPARISON:  None. FINDINGS: Gallbladder: No gallbladder wall thickening. Small amount of biliary sludge. No sonographic Murphy sign noted by sonographer. Common bile duct: Diameter: 3 mm, normal Liver: No focal lesion identified. Within normal limits in parenchymal echogenicity. Portal vein is patent on color Doppler imaging with normal direction of blood flow towards the liver. Other: None. IMPRESSION: Biliary sludge without evidence of acute cholecystitis. Electronically Signed   By: Valentino Saxon MD   On: 09/19/2020 16:18       Scheduled Meds:  Chlorhexidine Gluconate Cloth  6 each Topical Daily   enoxaparin (LOVENOX) injection  40 mg Subcutaneous Q24H   hydrocortisone sod succinate (SOLU-CORTEF) inj  50 mg Intravenous Q8H   Continuous Infusions:  ceFEPime (MAXIPIME) IV 2 g (09/21/20 0803)   vancomycin 750 mg (09/21/20 0410)     LOS: 2 days    Flora Lipps, MD  [facility] 7PM-7AM contact night coverage as above

## 2020-09-21 NOTE — Progress Notes (Signed)
  Echocardiogram 2D Echocardiogram has been performed.  Bobbye Charleston 09/21/2020, 3:23 PM

## 2020-09-21 NOTE — Consult Note (Signed)
Referral MD  Reason for Referral: Relapsed Hodgkin's disease; pulmonary dysfunction secondary to COVID    Chief Complaint  Patient presents with   Hyperkalemia  : I just did not feel well.  HPI: Steve [name] is well-known to me.  He is running 65 year old white male.  He has a history of Hodgkin's lymphoma.  He has had past chemotherapy but stopped secondary to COVID.  He has had significant pulmonary dysfunction secondary to COVID.  He has a tracheotomy in.  He has been hospitalized on many occasions because of pulmonary dysfunction.  We are not treating him for over a month because of the issues with his lungs.  When we last saw him in the office, we were worried about his elevated liver function studies.  He did have an ultrasound of his liver on 09/19/2020.  This showed biliary sludge.  There is no cholecystitis.  I think the liver looked okay.  He was subsequently admitted on 09/20/2020.  His heart is exactly what happened to him.  He was not feeling well.  He was not eating.  He had a chest x-ray which showed multifocal pneumonia with pulmonary fibrosis and mediastinal adenopathy.  Had a CT of the chest done.  This showed increasing adenopathy.  He had extensive bilateral groundglass opacities with right middle lobe atelectasis.  He had very bronchovascular nodularity.  Blood cultures did grow out Staphylococcus epidermidis.  Have to believe this is a true pathogen since this was from the Port-A-Cath.  I would get an echocardiogram on him to make sure there is no evidence of endocarditis.  His labs today show white cell count of 24.9.  Hemoglobin 10.2.  Platelet count 314,000.  His sodium is 135.  Potassium 4.7.  BUN 25 creatinine 0.55.  Blood sugar 254.  His albumin is 2.2.  His liver function studies look normal.  He actually looks pretty good.  He is talking to me.  The the tracheostomy is capped.  He is on IV antibiotics.  Is hard to say how much he is eating.  There does  not appear to be any nausea or vomiting.  He says he has had some bloody secretions from the trach.  He has had no diarrhea.  There is no rashes.  Overall, his performance status is ECOG 1-2.   Past Medical History:  Diagnosis Date   Abscess of muscle 08/10/2011   staph infection of right hip    Acute on chronic respiratory failure with hypoxia ([name])    Acute respiratory distress syndrome (ARDS) due to COVID-19 virus ([name])    Anxiety    Diabetes mellitus without complication ([name])    GERD (gastroesophageal reflux disease)    rare reflux - no meds for reflux - NO PROBLEM IN PAST SEVERAL YRS   HCAP (healthcare-associated pneumonia)    Hip dysplasia, congenital    no surgery as a child for hip dysplasia - has had bilateral hip replacements as an adult   Hodgkin lymphoma ([place])    Hypertension    Pancreatitis    Pneumothorax, acute    Postoperative anemia due to acute blood loss 09/07/2012   Septic arthritis of hip ([place]) 09/05/2012   PT'S TOTAL HIP JOINT REMOVED - ANTIBIOTIC SPACE PLACED AND PT HAS FINISHED IV ANTIBIOTICS ( PICC LINE REMOVED)   Sleep apnea    USES CPAP  :   Past Surgical History:  Procedure Laterality Date   COLONOSCOPY  04/26/2007   HERNIA REPAIR     inguinal  hernia x3   IR GASTROSTOMY TUBE MOD SED  08/22/2019   IR IMAGING GUIDED PORT INSERTION  03/29/2019   JOINT REPLACEMENT  2002 & 2007   bilateral hip replacement   LYMPH NODE BIOPSY Right 03/20/2019   Procedure: DEEP RIGHT SUPRACLAVICULAR LYMPH NODE EXCISION;  Surgeon: Fanny Skates, MD;  Location: [facility];  Service: General;  Laterality: Right;   MULTIPLE EXTRACTIONS WITH ALVEOLOPLASTY  07/28/2011   Procedure: MULTIPLE EXTRACION WITH ALVEOLOPLASTY;  Surgeon: Lenn Cal, DDS;  Location: [facility];  Service: Oral Surgery;  Laterality: N/A;  Extraction of tooth #'s 2,3,4,5,6,11,12,13,15,19,22 with alveoloplasty.   shoulder repair - right for separation of shoulder     TEE WITHOUT  CARDIOVERSION  07/29/2011   Procedure: TRANSESOPHAGEAL ECHOCARDIOGRAM (TEE);  Surgeon: Josue Hector, MD;  Location: [facility];  Service: Cardiovascular;  Laterality: N/A;   TOTAL HIP REVISION Right 09/05/2012   Procedure: RIGHT HIP RESECTION ARTHROPLASTY WITH ANTIBIOTIC SPACERS;  Surgeon: [name], MD;  Location: [facility];  Service: Orthopedics;  Laterality: Right;   TOTAL HIP REVISION Right 11/30/2012   Procedure: RIGHT TOTAL HIP ARTHROPLASTY REIMPLANTATION;  Surgeon: [name], MD;  Location: [facility];  Service: Orthopedics;  Laterality: Right;  :   Current Facility-Administered Medications:    ceFEPIme (MAXIPIME) 2 g in sodium chloride 0.9 % 100 mL IVPB, 2 g, Intravenous, Q8H, Gadhia, Jigna M, RPH, Last Rate: 200 mL/hr at 09/20/20 2356, 2 g at 09/20/20 2356   Chlorhexidine Gluconate Cloth 2 % PADS 6 each, 6 each, Topical, Daily, Dana Allan I, MD, 6 each at 09/20/20 1352   enoxaparin (LOVENOX) injection 40 mg, 40 mg, Subcutaneous, Q24H, Dana Allan I, MD, 40 mg at 09/20/20 2120   hydrocortisone sodium succinate (SOLU-CORTEF) 100 MG injection 50 mg, 50 mg, Intravenous, Q8H, Ogbata, Sylvester I, MD, 50 mg at 09/21/20 0527   vancomycin (VANCOREADY) IVPB 750 mg/150 mL, 750 mg, Intravenous, Q12H, Gadhia, Jigna M, RPH, Last Rate: 150 mL/hr at 09/21/20 0410, 750 mg at 09/21/20 0410:   Chlorhexidine Gluconate Cloth  6 each Topical Daily   enoxaparin (LOVENOX) injection  40 mg Subcutaneous Q24H   hydrocortisone sod succinate (SOLU-CORTEF) inj  50 mg Intravenous Q8H  :  No Known Allergies:   Family History  Problem Relation Age of Onset   Melanoma Mother    Heart attack Mother    Hyperlipidemia Neg Hx    Sudden death Neg Hx    Hypertension Neg Hx    Diabetes Neg Hx   :   Social History   Socioeconomic History   Marital status: Married    Spouse name: Not on file   Number of children: Not on file   Years of education: Not on file   Highest education level: Not  on file  Occupational History   Not on file  Tobacco Use   Smoking status: Never   Smokeless tobacco: Never  Vaping Use   Vaping Use: Never used  Substance and Sexual Activity   Alcohol use: Yes    Comment: rare beer. 5 times a year at most. 2 beers when he drinks.   Drug use: No   Sexual activity: Yes    Partners: Female  Other Topics Concern   Not on file  Social History Narrative   Married, 1 son and 3 sons.   Welder/fabricator   2 caffeinated beverages daily   Social Determinants of Health   Financial Resource Strain: Not on file  Food Insecurity:  Not on file  Transportation Needs: Not on file  Physical Activity: Not on file  Stress: Not on file  Social Connections: Not on file  Intimate Partner Violence: Not on file  :  Review of Systems  Constitutional:  Positive for malaise/fatigue.  HENT: Negative.    Eyes: Negative.   Respiratory:  Positive for hemoptysis.   Cardiovascular: Negative.   Gastrointestinal: Negative.   Genitourinary: Negative.   Musculoskeletal: Negative.   Skin: Negative.   Neurological: Negative.   Endo/Heme/Allergies: Negative.   Psychiatric/Behavioral: Negative.      Exam:  This is a somewhat thin white male in no obvious distress.  He does have a tracheostomy.  The vital signs show temperature 98.3.  Pulse 98.  Blood pressure 112/68.  Weight was 139 pounds.  Head neck exam shows the tracheotomy in place.  There is no scleral icterus.  There is no oral lesions.  He has no thrush.  Lungs are with decreased breath sounds bilaterally.  He has decent air movement bilaterally.  Cardiac exam tachycardic but regular.  There are no murmurs.  Abdomen is soft.  Bowel sounds are slightly decreased.  He has no fluid wave.  Extremities shows no clubbing, cyanosis or edema.  Neurological exam shows no focal neurological deficits.  Skin exam shows no rashes, ecchymosis or petechia.    Patient Vitals for the past 24 hrs:  BP Temp Temp src Pulse Resp  SpO2  09/21/20 0511 112/68 98.3 F (36.8 C) Oral 98 20 94 %  09/20/20 2119 -- -- -- -- (!) 29 --  09/20/20 2003 106/67 98.7 F (37.1 C) Oral (!) 105 20 93 %  09/20/20 2002 106/67 98.7 F (37.1 C) Oral (!) 105 20 92 %  09/20/20 1759 105/69 -- -- 96 (!) 22 99 %  09/20/20 1618 -- -- -- 96 -- 94 %  09/20/20 1228 104/70 97.6 F (36.4 C) Oral 92 18 93 %  09/20/20 1217 -- -- -- 95 -- 96 %  09/20/20 1000 102/67 97.8 F (36.6 C) Oral 85 20 94 %  09/20/20 0839 -- -- -- 88 (!) 26 97 %  09/20/20 0754 99/67 98.2 F (36.8 C) Oral 92 19 100 %      Recent Labs    09/19/20 1511 09/20/20 1015  WBC 19.0* 24.9*  HGB 10.2* 10.2*  HCT 33.1* 33.3*  PLT 313 314    Recent Labs    09/19/20 1511 09/20/20 0708  NA 131* 135  K 4.4 4.7  CL 81* 87*  CO2 40* 43*  GLUCOSE 223* 254*  BUN 23 25*  CREATININE 0.43* 0.55*  CALCIUM 9.2 9.6    Blood smear review: None  Pathology: None    Assessment and Plan: Steve [name] is a very nice 65 year old white male.  He has Hodgkin's lymphoma.  This is clearly progressed.  We would not be able to treat him aggressively because of his underlying pulmonary status.  He has been hospitalized on several occasions.  I think the problem right now is this Staphylococcus bacteremia.  He has both bottles positive.  This is a pathogen in my opinion.  I would treat this as such.  I do not think the Port-A-Cath has to come out right now.  I think we can try to treat him and try to eradicate this bacteremia.  It probably would not be a bad idea to do get an echocardiogram on him.  I think we will have to go with systemic chemotherapy to  treat his lymphoma now.  I just do not see any other way around this.  I would worry about immunotherapy given the fact that he has this underlying lung problem.  I do think that he would be able to tolerate dose adjusted ICE protocol.  Again, I realize that he is not in the best shape.  However, I think he does deserve to have a  chance of being treated.  I do think that ICE will work.  Again we will get him to be very careful with the dosing.  We just are not able to treat him right now given that he has his bacteremia.  I think the bacteremia has to be treated initially.  We will follow along.  I know that he is going to get fantastic care from everybody up on [facility] [address].  I appreciate their compassion and their professional approach to patient care.  Lattie Haw, MD  [facility] 4:8

## 2020-09-21 NOTE — Progress Notes (Signed)
Initial Nutrition Assessment  DOCUMENTATION CODES:   Severe malnutrition in context of chronic illness  INTERVENTION:   -Recommend resume Osmolite 1.2 @ 65 ml/hr via PEG -45 ml Prosource TF BID -Provides 1952 kcals, 108g protein and 1279 ml H2O.  -Encouraged pt to not lie back flat and HOB to stay >30 degrees.  - 48 hour Calorie Count    NUTRITION DIAGNOSIS:   Severe Malnutrition related to chronic illness, cancer and cancer related treatments as evidenced by severe fat depletion, severe muscle depletion.  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  Patient was discharged from this hospital on 08/25/2020.  Patient was said to have done very well for the first 3 weeks.  Over the last 5 days, patient has been failing to thrive, with associated poor appetite and malaise.  Patient in room, breakfast tray still in room. Pt consumed 25% of his omelet with some milk . (Provided 218 kcals, 12g protein).  Pt states that initially after his discharge, he was eating well (3 meals a day) and also running his TF at goal rate for 24 hours.  Pt has continued to run his TF at home at goal. His intakes started to decrease 5 days PTA and he was experiencing some feelings of fullness. Pt also admits to lying back flat, he has had a hard time sleeping and has a hard time getting comfortable in bed. Encouraged pt to stay elevated > 30 degrees.  Per pt, states he was told by oncology his cancer  has relapsed and he will still undergo chemotherapy. Will likely need tube feeding to continue to support treatments.  Per weight records, weight has decreased 3 lbs since 5/31.   Medications reviewed.  Labs reviewed.  NUTRITION - FOCUSED PHYSICAL EXAM:  Flowsheet Row Most Recent Value  Orbital Region Severe depletion  Upper Arm Region Moderate depletion  Thoracic and Lumbar Region Unable to assess  Buccal Region Severe depletion  Temple Region Moderate depletion  Clavicle Bone Region Moderate depletion  Clavicle and Acromion Bone Region Moderate depletion  Scapular Bone Region Moderate depletion  Dorsal Hand Moderate depletion  Patellar Region Severe depletion  Anterior Thigh Region Severe depletion  Posterior Calf Region Severe depletion  Edema (RD Assessment) None  Hair Reviewed  Charma Igo loss]  Mouth Reviewed  Skin Reviewed       Diet Order:   Diet Order             Diet Carb Modified Fluid consistency: Thin; Room service appropriate? Yes  Diet effective now                   EDUCATION NEEDS:   Education needs have been addressed  Skin:  Skin Assessment: Reviewed RN Assessment  Last BM:  PTA  Height:   Ht Readings from Last 1 Encounters:  09/18/20 5\' 5"  (1.651 m)    Weight:   Wt Readings from Last 1 Encounters:  09/19/20 63.3 kg    BMI:  Body mass index is 23.22 kg/m.  Estimated Nutritional Needs:   Kcal:  1950-2150  Protein:  95-110g  Fluid:  2L/day  Clayton Bibles, MS, RD, LDN Inpatient Clinical Dietitian Contact information available via Amion

## 2020-09-21 NOTE — Consult Note (Addendum)
[facility]  Total days of antibiotics 3/vanco and cefepime       Reason for Consult: staph epi bacteremia   Referring Physician: pokhrel  Active Problems:   Failure to thrive (child)    HPI: Steve [name] is a 65 y.o. 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 He was discharged on pred 40mg  daily to follow up with dr sood. He is now admitted for malaise and feeling poorly, decreased appetite x 5 days. Recent imaging shows increased adenopathy. With extensive bilateral groundglass opacities in RML but also mediastinal adenopathy. He had leukocytosis of 24K, concerning for infection. He was started on broad spectrum abtx of vancomycin and cefepime. 4 of 4 blood cx positive for staph epi. Prior to admit, he had lab abn of transaminitis and hyperkalemia (which are now improved) U/S of liver showing biliary sludge but no obstruction.    Past Medical History:  Diagnosis Date   Abscess of muscle 08/10/2011   staph infection of right hip    Acute on chronic respiratory failure with hypoxia ([name])    Acute respiratory distress syndrome (ARDS) due to COVID-19 virus ([name])    Anxiety    Diabetes mellitus without complication ([name])    GERD (gastroesophageal reflux disease)    rare reflux - no meds for reflux - NO PROBLEM IN PAST SEVERAL YRS   HCAP (healthcare-associated pneumonia)    Hip dysplasia, congenital    no surgery as a child for hip dysplasia - has had bilateral hip replacements as an adult   Hodgkin lymphoma ([place])    Hypertension    Pancreatitis    Pneumothorax, acute    Postoperative anemia due to acute blood loss 09/07/2012   Septic arthritis of hip ([place]) 09/05/2012   PT'S TOTAL HIP JOINT REMOVED - ANTIBIOTIC SPACE PLACED  AND PT HAS FINISHED IV ANTIBIOTICS ( PICC LINE REMOVED)   Sleep apnea    USES CPAP    Allergies: No Known Allergies   MEDICATIONS:  Chlorhexidine Gluconate Cloth  6 each Topical Daily   enoxaparin (LOVENOX) injection  40 mg Subcutaneous Q24H   hydrocortisone sod succinate (SOLU-CORTEF) inj  50 mg Intravenous Q8H    Social History   Tobacco Use   Smoking status: Never   Smokeless tobacco: Never  Vaping Use   Vaping Use: Never used  Substance Use Topics   Alcohol use: Yes    Comment: rare beer. 5 times a year at most. 2 beers when he drinks.   Drug use: No    Family History  Problem Relation Age of Onset   Melanoma Mother    Heart attack Mother    Hyperlipidemia Neg Hx    Sudden death Neg Hx    Hypertension Neg Hx    Diabetes Neg Hx      Review of Systems  Constitutional: Negative for fever, + chills, diaphoresis, + activity change, appetite change, fatigue and unexpected weight change.  HENT: Negative for congestion, sore throat, rhinorrhea, sneezing, trouble swallowing and sinus pressure.  Eyes: Negative for photophobia and visual disturbance.  Respiratory: + cough, chest tightness, shortness of breath, wheezing and stridor.  Cardiovascular: Negative for chest pain, palpitations and leg swelling.  Gastrointestinal: Negative for nausea, vomiting, abdominal pain, diarrhea, constipation, blood in  stool, abdominal distention and anal bleeding.  Genitourinary: Negative for dysuria, hematuria, flank pain and difficulty urinating.  Musculoskeletal: Negative for myalgias, back pain, joint swelling, arthralgias and gait problem.  Skin: Negative for color change, pallor, rash and wound.  Neurological: Negative for dizziness, tremors, weakness and light-headedness.  Hematological: Negative for adenopathy. Does not bruise/bleed easily.  Psychiatric/Behavioral: Negative for behavioral problems, confusion, sleep disturbance, dysphoric mood, decreased concentration and agitation.     OBJECTIVE: Temp:  [98 F (36.7 C)-98.7 F (37.1 C)] 98 F (36.7 C) (06/13 1312) Pulse Rate:  [94-105] 94 (06/13 1312) Resp:  [20-29] 20 (06/13 0511) BP: (103-112)/(66-68) 103/66 (06/13 1312) SpO2:  [91 %-100 %] 95 % (06/13 1350) FiO2 (%):  [80 %-98 %] 80 % (06/13 1350)  Undergoing procedure Gen= a x o by 3, appears chronically ill, fatigued, emaciated HEENT = gaunt, bitemporal wasting EOMI, no scleral icterus Neck= trach in place Pulm = mild ronchi ant lung fields, breath sounds equal Cors= nl s1,s2 no g/m/r Abd= scaphoid ab Ext= no c/c/e LABS: Results for orders placed or performed during the hospital encounter of 09/19/20 (from the past 48 hour(s))  Legionella Pneumophila Serogp 1 Ur Ag     Status: None   Collection Time: 09/19/20  9:00 PM  Result Value Ref Range   L. pneumophila Serogp 1 Ur Ag Negative Negative    Comment: (NOTE) Presumptive negative for L. pneumophila serogroup 1 antigen in urine, suggesting no recent or current infection. Legionnaires' disease cannot be ruled out since other serogroups and species may also cause disease. Performed At: [facility] [place], [place] [id number] Rush Farmer MD IW:[id number]    Source of Sample URINE, RANDOM     Comment: Performed at [facility], [place] [address] [place], [place] [address]  Strep pneumoniae urinary antigen     Status: None   Collection Time: 09/19/20  9:00 PM  Result Value Ref Range   Strep Pneumo Urinary Antigen NEGATIVE NEGATIVE    Comment:        Infection due to S. pneumoniae cannot be absolutely ruled out since the antigen present may be below the detection limit of the test. Performed at [facility], [address], [place], [place] [address]   Sodium, urine, random     Status: None   Collection Time: 09/19/20  9:00 PM  Result Value Ref Range   Sodium, Ur 66 mmol/L    Comment: Performed at [facility], [place]  [address], [place], [place] [address]  Osmolality, urine     Status: None   Collection Time: 09/19/20  9:00 PM  Result Value Ref Range   Osmolality, Ur 475 300 - 900 mOsm/kg    Comment: PERFORMED AT [facility] Performed at [facility], [place] [address], [place], [place] [address]   MRSA PCR Screening     Status: None   Collection Time: 09/20/20  6:50 AM   Specimen: Nasopharyngeal  Result Value Ref Range   MRSA by PCR NEGATIVE NEGATIVE    Comment:        The GeneXpert MRSA Assay (FDA approved for NASAL specimens only), is one component of a comprehensive MRSA colonization surveillance program. It is not intended to diagnose MRSA infection nor to guide or monitor treatment for MRSA infections. Performed at [facility], [place] [address], [place], [place] [address]   Comprehensive metabolic panel     Status: Abnormal   Collection Time:  09/20/20  7:08 AM  Result Value Ref Range   Sodium 135 135 - 145 mmol/L   Potassium 4.7 3.5 - 5.1 mmol/L   Chloride 87 (L) 98 - 111 mmol/L   CO2 43 (H) 22 - 32 mmol/L   Glucose, Bld 254 (H) 70 - 99 mg/dL    Comment: Glucose reference range applies only to samples taken after fasting for at least 8 hours.   BUN 25 (H) 8 - 23 mg/dL   Creatinine, Ser 0.55 (L) 0.61 - 1.24 mg/dL   Calcium 9.6 8.9 - 10.3 mg/dL   Total Protein 6.7 6.5 - 8.1 g/dL   Albumin 2.2 (L) 3.5 - 5.0 g/dL   AST 15 15 - 41 U/L   ALT 30 0 - 44 U/L   Alkaline Phosphatase 82 38 - 126 U/L   Total Bilirubin 0.8 0.3 - 1.2 mg/dL   GFR, Estimated >60 >60 mL/min    Comment: (NOTE) Calculated using the CKD-EPI Creatinine Equation (2021)    Anion gap 5 5 - 15    Comment: Performed at [facility], [place] [address] [place], [place] [address]  TSH     Status: None   Collection Time: 09/20/20  7:08 AM  Result Value Ref Range   TSH 0.862 0.350 - 4.500 uIU/mL    Comment: Performed by a 3rd Generation assay with a functional sensitivity of  <=0.01 uIU/mL. Performed at [facility], [place] [address], [place], [place] [address]   Magnesium     Status: None   Collection Time: 09/20/20  7:08 AM  Result Value Ref Range   Magnesium 1.9 1.7 - 2.4 mg/dL    Comment: Performed at [facility], [place] [address] [place], [place] [address]  Procalcitonin - Baseline     Status: None   Collection Time: 09/20/20  7:08 AM  Result Value Ref Range   Procalcitonin <0.10 ng/mL    Comment:        Interpretation: PCT (Procalcitonin) <= 0.5 ng/mL: Systemic infection (sepsis) is not likely. Local bacterial infection is possible. (NOTE)       Sepsis PCT Algorithm           Lower Respiratory Tract                                      Infection PCT Algorithm    ----------------------------     ----------------------------         PCT < 0.25 ng/mL                PCT < 0.10 ng/mL          Strongly encourage             Strongly discourage   discontinuation of antibiotics    initiation of antibiotics    ----------------------------     -----------------------------       PCT 0.25 - 0.50 ng/mL            PCT 0.10 - 0.25 ng/mL               OR       >80% decrease in PCT            Discourage initiation of  antibiotics      Encourage discontinuation           of antibiotics    ----------------------------     -----------------------------         PCT >= 0.50 ng/mL              PCT 0.26 - 0.50 ng/mL               AND        <80% decrease in PCT             Encourage initiation of                                             antibiotics       Encourage continuation           of antibiotics    ----------------------------     -----------------------------        PCT >= 0.50 ng/mL                  PCT > 0.50 ng/mL               AND         increase in PCT                  Strongly encourage                                      initiation of antibiotics    Strongly  encourage escalation           of antibiotics                                     -----------------------------                                           PCT <= 0.25 ng/mL                                                 OR                                        > 80% decrease in PCT                                      Discontinue / Do not initiate                                             antibiotics  Performed at [facility] [address], [place], [place] [address]   Osmolality     Status: Abnormal   Collection Time:  09/20/20  7:08 AM  Result Value Ref Range   Osmolality 303 (H) 275 - 295 mOsm/kg    Comment: Performed at [facility] [address] [place], [place] [address]  CBC with Differential/Platelet     Status: Abnormal   Collection Time: 09/20/20 10:15 AM  Result Value Ref Range   WBC 24.9 (H) 4.0 - 10.5 K/uL   RBC 3.56 (L) 4.22 - 5.81 MIL/uL   Hemoglobin 10.2 (L) 13.0 - 17.0 g/dL   HCT 33.3 (L) 39.0 - 52.0 %   MCV 93.5 80.0 - 100.0 fL   MCH 28.7 26.0 - 34.0 pg   MCHC 30.6 30.0 - 36.0 g/dL   RDW 17.2 (H) 11.5 - 15.5 %   Platelets 314 150 - 400 K/uL   nRBC 0.0 0.0 - 0.2 %   Neutrophils Relative % 88 %   Neutro Abs 21.8 (H) 1.7 - 7.7 K/uL   Lymphocytes Relative 3 %   Lymphs Abs 0.8 0.7 - 4.0 K/uL   Monocytes Relative 7 %   Monocytes Absolute 1.7 (H) 0.1 - 1.0 K/uL   Eosinophils Relative 0 %   Eosinophils Absolute 0.0 0.0 - 0.5 K/uL   Basophils Relative 0 %   Basophils Absolute 0.1 0.0 - 0.1 K/uL   Immature Granulocytes 2 %   Abs Immature Granulocytes 0.59 (H) 0.00 - 0.07 K/uL    Comment: Performed at [facility], [place] [address], [place], [place] [address]    MICRO: reviewed IMAGING: CT CHEST WO CONTRAST  Result Date: 09/20/2020 CLINICAL DATA:  Respiratory failure.  History of Hodgkin's lymphoma. EXAM: CT CHEST WITHOUT CONTRAST TECHNIQUE: Multidetector CT imaging of the chest was performed following the  standard protocol without IV contrast. COMPARISON:  July 21, 2020 FINDINGS: Cardiovascular: Heart is mildly enlarged. LEFT chest port tip terminates at the superior cavoatrial junction. No pericardial effusion. Minimal atherosclerotic calcifications. Enlarged pulmonary artery as can be seen in pulmonary arterial hypertension. Mediastinum/Nodes: Revisualization of extensive adenopathy. LEFT level 3 axillary lymph node measures 11 mm, previously 3 mm (series 2, image 42). LEFT supraclavicular lymph node measures 15 mm, previously 15 mm (series 2, image 24). RIGHT level 1 axillary lymph node measures 8 mm, previously 4 mm (series 2, image 63). RIGHT internal mammary lymph node measures 31 mm, previously 26 mm (series 2, image 77). RIGHT paratracheal conglomeration spanned 59 x 43 mm, previously 44 by 30 mm (series 2, image 45). There is a lunate configuration of the trachea. Lungs/Pleura: Visualization of a peripheral rind of fluid adjacent to the RIGHT lung, similar in comparison to prior. Extensive bilateral ground-glass opacities with subpleural reticulation and suggestion of honeycombing. Peribronchovascular nodularity is similar in comparison to prior. Similar appearance of RIGHT middle lobe atelectasis. Upper Abdomen: No acute abnormality. Musculoskeletal: Known osseous metastatic disease is not well visualized. Limited evaluation of the ribs secondary to respiratory motion. No acute thoracic spine fracture. IMPRESSION: 1. Interval increase in bulky adenopathy within the mediastinum consistent with known lymphoma. Increased axillary lymph node size in comparison to prior. 2. Revisualization of sequela of advanced pulmonary fibrosis. Unchanged appearance of peribronchovascular nodularity. This could reflect lymphangitic spread of lymphoma versus underlying infection. 3. Unchanged small rind of RIGHT-sided pleural fluid. Underlying pleural nodularity from lymphoma remains in the differential. 4. 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 5. Enlarged pulmonary artery as can be seen in pulmonary arterial hypertension. Electronically Signed   By: Valentino Saxon MD   On:  09/20/2020 17:25   DG Swallowing Func-Speech Pathology  Result Date: 09/20/2020 Formatting of this result is different from the original. Objective Swallowing Evaluation: Type of Study: MBS-Modified Barium Swallow Study  Patient Details Name: LYSANDER CALIXTE MRN: [id number] Date of Birth: Jul 30, 1955 Today's Date: 09/20/2020 Time: SLP Start Time (ACUTE ONLY): 0903 -SLP Stop Time (ACUTE ONLY): 9379 SLP Time Calculation (min) (ACUTE ONLY): 20 min Past Medical History: Past Medical History: Diagnosis Date  Abscess of muscle 08/10/2011  staph infection of right hip   Acute on chronic respiratory failure with hypoxia ([name])   Acute respiratory distress syndrome (ARDS) due to COVID-19 virus ([place])   Anxiety   Diabetes mellitus without complication ([name])   GERD (gastroesophageal reflux disease)   rare reflux - no meds for reflux - NO PROBLEM IN PAST SEVERAL YRS  HCAP (healthcare-associated pneumonia)   Hip dysplasia, congenital   no surgery as a child for hip dysplasia - has had bilateral hip replacements as an adult  Hodgkin lymphoma ([place])   Hypertension   Pancreatitis   Pneumothorax, acute   Postoperative anemia due to acute blood loss 09/07/2012  Septic arthritis of hip ([place]) 09/05/2012  PT'S TOTAL HIP JOINT REMOVED - ANTIBIOTIC SPACE PLACED AND PT HAS FINISHED IV ANTIBIOTICS ( PICC LINE REMOVED)  Sleep apnea   USES CPAP Past Surgical History: Past Surgical History: Procedure Laterality Date  COLONOSCOPY  04/26/2007  HERNIA REPAIR    inguinal hernia x3  IR GASTROSTOMY TUBE MOD SED  08/22/2019  IR IMAGING GUIDED PORT INSERTION  03/29/2019  JOINT REPLACEMENT  2002 & 2007  bilateral hip replacement  LYMPH NODE BIOPSY Right 03/20/2019  Procedure: DEEP RIGHT SUPRACLAVICULAR LYMPH NODE EXCISION;  Surgeon: Fanny Skates,  MD;  Location: [facility];  Service: General;  Laterality: Right;  MULTIPLE EXTRACTIONS WITH ALVEOLOPLASTY  07/28/2011  Procedure: MULTIPLE EXTRACION WITH ALVEOLOPLASTY;  Surgeon: Lenn Cal, DDS;  Location: [facility];  Service: Oral Surgery;  Laterality: N/A;  Extraction of tooth #'s 2,3,4,5,6,11,12,13,15,19,22 with alveoloplasty.  shoulder repair - right for separation of shoulder    TEE WITHOUT CARDIOVERSION  07/29/2011  Procedure: TRANSESOPHAGEAL ECHOCARDIOGRAM (TEE);  Surgeon: Josue Hector, MD;  Location: [facility];  Service: Cardiovascular;  Laterality: N/A;  TOTAL HIP REVISION Right 09/05/2012  Procedure: RIGHT HIP RESECTION ARTHROPLASTY WITH ANTIBIOTIC SPACERS;  Surgeon: [name], MD;  Location: [facility];  Service: Orthopedics;  Laterality: Right;  TOTAL HIP REVISION Right 11/30/2012  Procedure: RIGHT TOTAL HIP ARTHROPLASTY REIMPLANTATION;  Surgeon: [name], MD;  Location: [facility];  Service: Orthopedics;  Laterality: Right; HPI: Patient is a 65 year old Caucasian  male with medical history significant of chronic tracheostomy after prolonged hospitalization in 2021 related with pneumonia due to [id number] complicated by ARDS and pulmonary fibrosis +/-pneumonitis from brentuximab, s/p PEG, HTN, DM type II, Hodgkin's lymphoma on chemotherapy and GERD.  Patient was discharged from this hospital on 08/25/2020.  Patient was said to have done very well for the first 3 weeks.  Over the last 5 days, patient has been failing to thrive, with associated poor appetite and malaise.  Readmitted with unchanged multifocal PNA.  Subjective: pt alert, eager to go home Assessment / Plan / Recommendation CHL IP CLINICAL IMPRESSIONS 09/20/2020 Clinical Impression Patient presents with excellent oropharyngeal swallowing function. Oral phase timely and with full clearance of bolus. Pharyngeally, strength has improved from previous MBS 03/2020 in that patient has no pharyngeal residue post swallow.  Intermittent trace penetration of thin liquids noted, primarily  via straw, due to mildly decreased laryngeal closure noted, remaining above the vocal cords and clearing with either subsequent dry swallow or cued throat clear. Tested all consistencies both with and without PMSV in place with no notable difference in swallowing function. Educated patient and spouse (via phone per patient request) on results of testing and recommendations which include use of PMV if able (not necessary) to faciliate improved strength of cough/throat clear if needed. Both verbalized understanding. SLP will f/u briefly for tolerance and continued education as needed. Do not suspect that dysphagia is resulting in recurrent PNAs. Cannot however r/o contribution of PEG tube feedings on aspiration, particularly because patient is also taking pos by mouth in conjunction with tube feeds, and complains of feeling full often. Recommend dietary consult to ensure that patient is being fed via tube the correct amount and in the correct manner based on how much he consuming by mouth. SLP Visit Diagnosis Dysphagia, pharyngeal phase (R13.13) Attention and concentration deficit following -- Frontal lobe and executive function deficit following -- Impact on safety and function Mild aspiration risk   CHL IP TREATMENT RECOMMENDATION 09/20/2020 Treatment Recommendations Therapy as outlined in treatment plan below   Prognosis 08/23/2020 Prognosis for Safe Diet Advancement Good Barriers to Reach Goals -- Barriers/Prognosis Comment -- CHL IP DIET RECOMMENDATION 09/20/2020 SLP Diet Recommendations Regular solids;Thin liquid Liquid Administration via Cup;Straw Medication Administration Whole meds with liquid Compensations Slow rate;Small sips/bites Postural Changes Remain semi-upright after after feeds/meals (Comment);Seated upright at 90 degrees   CHL IP OTHER RECOMMENDATIONS 09/20/2020 Recommended Consults Other (Comment) Oral Care Recommendations Oral care BID  Other Recommendations --   CHL IP FOLLOW UP RECOMMENDATIONS 09/20/2020 Follow up Recommendations None   CHL IP FREQUENCY AND DURATION 09/20/2020 Speech Therapy Frequency (ACUTE ONLY) min 1 x/week Treatment Duration 1 week      CHL IP ORAL PHASE 09/20/2020 Oral Phase WFL Oral - Pudding Teaspoon -- Oral - Pudding Cup -- Oral - Honey Teaspoon -- Oral - Honey Cup -- Oral - Nectar Teaspoon -- Oral - Nectar Cup -- Oral - Nectar Straw -- Oral - Thin Teaspoon -- Oral - Thin Cup -- Oral - Thin Straw -- Oral - Puree -- Oral - Mech Soft -- Oral - Regular -- Oral - Multi-Consistency -- Oral - Pill -- Oral Phase - Comment --  CHL IP PHARYNGEAL PHASE 09/20/2020 Pharyngeal Phase Impaired Pharyngeal- Pudding Teaspoon -- Pharyngeal -- Pharyngeal- Pudding Cup -- Pharyngeal -- Pharyngeal- Honey Teaspoon -- Pharyngeal -- Pharyngeal- Honey Cup -- Pharyngeal -- Pharyngeal- Nectar Teaspoon -- Pharyngeal -- Pharyngeal- Nectar Cup -- Pharyngeal -- Pharyngeal- Nectar Straw -- Pharyngeal -- Pharyngeal- Thin Teaspoon NT Pharyngeal -- Pharyngeal- Thin Cup WFL Pharyngeal -- Pharyngeal- Thin Straw Reduced laryngeal elevation;Penetration/Aspiration during swallow Pharyngeal Material enters airway, remains ABOVE vocal cords and not ejected out Pharyngeal- Puree WFL Pharyngeal -- Pharyngeal- Mechanical Soft WFL Pharyngeal -- Pharyngeal- Regular -- Pharyngeal -- Pharyngeal- Multi-consistency -- Pharyngeal -- Pharyngeal- Pill WFL Pharyngeal -- Pharyngeal Comment --  CHL IP CERVICAL ESOPHAGEAL PHASE 09/20/2020 Cervical Esophageal Phase WFL Pudding Teaspoon -- Pudding Cup -- Honey Teaspoon -- Honey Cup -- Nectar Teaspoon -- Nectar Cup -- Nectar Straw -- Thin Teaspoon -- Thin Cup -- Thin Straw -- Puree -- Mechanical Soft -- Regular -- Multi-consistency -- Pill -- Cervical Esophageal Comment -- McCoy Leah Meryl 09/20/2020, 9:32 AM              ECHOCARDIOGRAM COMPLETE  Result Date: 09/21/2020    ECHOCARDIOGRAM REPORT   Patient  Name:   Steve [name] [facility] Date  of Exam: 09/21/2020 Medical Rec #:  [id number]         Height:       65.0 in Accession #:    [id number]        Weight:       139.6 lb Date of Birth:  [birth year]          BSA:          1.698 m Patient Age:    40 years          BP:           103/66 mmHg Patient Gender: M                 HR:           95 bpm. Exam Location:  Inpatient Procedure: 2D Echo, 3D Echo, Cardiac Doppler and Color Doppler Indications:    Bacteremia  History:        Patient has prior history of Echocardiogram examinations, most                 recent 07/09/2019. Abnormal ECG, Arrythmias:Tachycardia,                 Signs/Symptoms:Bacteremia, Shortness of Breath and Dyspnea; Risk                 Factors:Sleep Apnea, Hypertension and Diabetes. Cancer. Right                 pneumothorax. Subcutaneous air. Hypoxia.  Sonographer:    Roseanna Rainbow RDCS Referring Phys: [place] Comments: Technically difficult study due to poor echo windows, suboptimal parasternal window, suboptimal apical window and suboptimal subcostal window. Patient has trach collar. Off aix medial images in apical region. Feeding tube in subcostal region. IMPRESSIONS  1. Left ventricular ejection fraction, by estimation, is 60 to 65%. The left ventricle has normal function. The left ventricle has no regional wall motion abnormalities. There is mild left ventricular hypertrophy. Left ventricular diastolic parameters were normal.  2. Right ventricular systolic function is normal. The right ventricular size is moderately enlarged. There is normal pulmonary artery systolic pressure.  3. The mitral valve is normal in structure. No evidence of mitral valve regurgitation.  4. The aortic valve was not well visualized. Aortic valve regurgitation is not visualized. No aortic stenosis is present.  5. The inferior vena cava is normal in size with greater than 50% respiratory variability, suggesting right atrial pressure of 3 mmHg. Conclusion(s)/Recommendation(s): Techincally  difficult study, but no vegetaion seen. If high clinical supicion for endocarditis, consider TEE. FINDINGS  Left Ventricle: Left ventricular ejection fraction, by estimation, is 60 to 65%. The left ventricle has normal function. The left ventricle has no regional wall motion abnormalities. The left ventricular internal cavity size was small. There is mild left ventricular hypertrophy. Left ventricular diastolic parameters were normal. Right Ventricle: The right ventricular size is moderately enlarged. Right vetricular wall thickness was not well visualized. Right ventricular systolic function is normal. There is normal pulmonary artery systolic pressure. The tricuspid regurgitant velocity is 2.78 m/s, and with an assumed right atrial pressure of 3 mmHg, the estimated right ventricular systolic pressure is 71.6 mmHg. Left Atrium: Left atrial size was normal in size. Right Atrium: Right atrial size was normal in size. Pericardium: There is no evidence of pericardial effusion. Mitral Valve: The mitral valve is normal in structure. No evidence of mitral valve regurgitation. Tricuspid  Valve: The tricuspid valve is normal in structure. Tricuspid valve regurgitation is trivial. Aortic Valve: The aortic valve was not well visualized. Aortic valve regurgitation is not visualized. No aortic stenosis is present. Pulmonic Valve: The pulmonic valve was not well visualized. Pulmonic valve regurgitation is not visualized. Aorta: The aortic root is normal in size and structure. Venous: The inferior vena cava is normal in size with greater than 50% respiratory variability, suggesting right atrial pressure of 3 mmHg. IAS/Shunts: The interatrial septum was not well visualized.  LEFT VENTRICLE PLAX 2D LVIDd:         3.60 cm      Diastology LVIDs:         2.30 cm      LV e' medial:    11.20 cm/s LV PW:         1.30 cm      LV E/e' medial:  8.5 LV IVS:        0.90 cm      LV e' lateral:   12.00 cm/s LVOT diam:     2.20 cm      LV E/e'  lateral: 7.9 LV SV:         57 LV SV Index:   34 LVOT Area:     3.80 cm  LV Volumes (MOD) LV vol d, MOD A2C: 66.9 ml LV vol d, MOD A4C: 101.0 ml LV vol s, MOD A2C: 22.7 ml LV vol s, MOD A4C: 24.2 ml LV SV MOD A2C:     44.2 ml LV SV MOD A4C:     101.0 ml LV SV MOD BP:      78.9 ml RIGHT VENTRICLE             IVC RV S prime:     18.20 cm/s  IVC diam: 1.80 cm TAPSE (M-mode): 2.0 cm LEFT ATRIUM           Index       RIGHT ATRIUM           Index LA diam:      3.40 cm 2.00 cm/m  RA Area:     15.10 cm LA Vol (A2C): 13.3 ml 7.83 ml/m  RA Volume:   34.90 ml  20.56 ml/m LA Vol (A4C): 28.1 ml 16.55 ml/m  AORTIC VALVE LVOT Vmax:   126.00 cm/s LVOT Vmean:  71.400 cm/s LVOT VTI:    0.150 m  AORTA Ao Root diam: 3.40 cm Ao Asc diam:  3.70 cm MITRAL VALVE               TRICUSPID VALVE MV Area (PHT): 5.19 cm    TR Peak grad:   30.9 mmHg MV Decel Time: 146 msec    TR Vmax:        278.00 cm/s MV E velocity: 95.14 cm/s MV A velocity: 84.80 cm/s  SHUNTS MV E/A ratio:  1.12        Systemic VTI:  0.15 m                            Systemic Diam: 2.20 cm Oswaldo Milian MD Electronically signed by Oswaldo Milian MD Signature Date/Time: 09/21/2020/6:08:46 PM    Final      Assessment/Plan:  65 yo M with relapse hodgkin lymphoma, 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  Staph epi  bacteremia = please have his port removed. Will need to evaluate for endocarditis if TTE is non-revealing. Repeat blood cx after port is removed  Possible recurrent pneumonia = continue on cefepime for the time being. Would check urine legionella, and strep pneumo. Will add azithromycin  Hodgkin lymphoma = defer to dr Marin Olp to timing for chemo, leukocytosis related to both underlying infection and malignancy  Severe protein-calorie malnutrition = to continue to advocate for increase food intake if  possible  Amelia Burgard [facility] [phone]

## 2020-09-21 NOTE — Plan of Care (Signed)
  Problem: Clinical Measurements: Goal: Respiratory complications will improve 09/21/2020 2356 by Woodfin Ganja, RN Outcome: Progressing 09/21/2020 2356 by Woodfin Ganja, RN Outcome: Progressing   Problem: Nutrition: Goal: Adequate nutrition will be maintained Outcome: Progressing

## 2020-09-21 NOTE — Progress Notes (Signed)
Contacted On call provider (C.Hall) patient has signed and held orders. Advised to release them, and continue with care as documented.  Same reviewed by on call doctor

## 2020-09-22 ENCOUNTER — Encounter: Payer: Self-pay | Admitting: *Deleted

## 2020-09-22 ENCOUNTER — Encounter (HOSPITAL_COMMUNITY): Payer: Self-pay | Admitting: Internal Medicine

## 2020-09-22 ENCOUNTER — Inpatient Hospital Stay (HOSPITAL_COMMUNITY): Payer: BC Managed Care – PPO

## 2020-09-22 DIAGNOSIS — E43 Unspecified severe protein-calorie malnutrition: Secondary | ICD-10-CM

## 2020-09-22 DIAGNOSIS — J189 Pneumonia, unspecified organism: Secondary | ICD-10-CM

## 2020-09-22 DIAGNOSIS — R7881 Bacteremia: Secondary | ICD-10-CM

## 2020-09-22 DIAGNOSIS — C8198 Hodgkin lymphoma, unspecified, lymph nodes of multiple sites: Secondary | ICD-10-CM | POA: Diagnosis not present

## 2020-09-22 DIAGNOSIS — B958 Unspecified staphylococcus as the cause of diseases classified elsewhere: Secondary | ICD-10-CM

## 2020-09-22 DIAGNOSIS — E119 Type 2 diabetes mellitus without complications: Secondary | ICD-10-CM

## 2020-09-22 DIAGNOSIS — D649 Anemia, unspecified: Secondary | ICD-10-CM

## 2020-09-22 DIAGNOSIS — J9610 Chronic respiratory failure, unspecified whether with hypoxia or hypercapnia: Secondary | ICD-10-CM

## 2020-09-22 DIAGNOSIS — C8191 Hodgkin lymphoma, unspecified, lymph nodes of head, face, and neck: Secondary | ICD-10-CM

## 2020-09-22 DIAGNOSIS — D72829 Elevated white blood cell count, unspecified: Secondary | ICD-10-CM

## 2020-09-22 DIAGNOSIS — R627 Adult failure to thrive: Secondary | ICD-10-CM | POA: Diagnosis not present

## 2020-09-22 DIAGNOSIS — J9611 Chronic respiratory failure with hypoxia: Secondary | ICD-10-CM | POA: Diagnosis not present

## 2020-09-22 HISTORY — PX: IR REMOVAL TUN ACCESS W/ PORT W/O FL MOD SED: IMG2290

## 2020-09-22 LAB — BASIC METABOLIC PANEL
Anion gap: 3 — ABNORMAL LOW (ref 5–15)
BUN: 25 mg/dL — ABNORMAL HIGH (ref 8–23)
CO2: 43 mmol/L — ABNORMAL HIGH (ref 22–32)
Calcium: 9.3 mg/dL (ref 8.9–10.3)
Chloride: 92 mmol/L — ABNORMAL LOW (ref 98–111)
Creatinine, Ser: 0.51 mg/dL — ABNORMAL LOW (ref 0.61–1.24)
GFR, Estimated: >60 mL/min (ref >60)
Glucose, Bld: 326 mg/dL — ABNORMAL HIGH (ref 70–99)
Potassium: 4.1 mmol/L (ref 3.5–5.1)
Sodium: 138 mmol/L (ref 135–145)

## 2020-09-22 LAB — CULTURE, BLOOD (ROUTINE X 2): Special Requests: ADEQUATE

## 2020-09-22 LAB — GLUCOSE, CAPILLARY
Glucose-Capillary: 310 mg/dL — ABNORMAL HIGH (ref 70–99)
Glucose-Capillary: 200 mg/dL — ABNORMAL HIGH (ref 70–99)
Glucose-Capillary: 115 mg/dL — ABNORMAL HIGH (ref 70–99)
Glucose-Capillary: 165 mg/dL — ABNORMAL HIGH (ref 70–99)
Glucose-Capillary: 371 mg/dL — ABNORMAL HIGH (ref 70–99)

## 2020-09-22 LAB — CBC
HCT: 29.8 % — ABNORMAL LOW (ref 39.0–52.0)
Hemoglobin: 9.1 g/dL — ABNORMAL LOW (ref 13.0–17.0)
MCH: 29.6 pg (ref 26.0–34.0)
MCHC: 30.5 g/dL (ref 30.0–36.0)
MCV: 97.1 fL (ref 80.0–100.0)
Platelets: 237 10*3/uL (ref 150–400)
RBC: 3.07 MIL/uL — ABNORMAL LOW (ref 4.22–5.81)
RDW: 17.6 % — ABNORMAL HIGH (ref 11.5–15.5)
WBC: 17.2 10*3/uL — ABNORMAL HIGH (ref 4.0–10.5)
nRBC: 0 % (ref 0.0–0.2)

## 2020-09-22 LAB — VANCOMYCIN, TROUGH: Vancomycin Tr: 10 ug/mL — ABNORMAL LOW (ref 15–20)

## 2020-09-22 LAB — VANCOMYCIN, PEAK: Vancomycin Pk: 37 ug/mL (ref 30–40)

## 2020-09-22 LAB — MAGNESIUM: Magnesium: 2.1 mg/dL (ref 1.7–2.4)

## 2020-09-22 LAB — PHOSPHORUS: Phosphorus: 2.7 mg/dL (ref 2.5–4.6)

## 2020-09-22 MED ORDER — MIDAZOLAM HCL 2 MG/2ML IJ SOLN
INTRAMUSCULAR | Status: AC | PRN
  Administered 2020-09-22: 1 mg via INTRAVENOUS

## 2020-09-22 MED ORDER — MIDAZOLAM HCL 2 MG/2ML IJ SOLN
INTRAMUSCULAR | Status: AC
  Filled 2020-09-22: qty 2

## 2020-09-22 MED ORDER — LIDOCAINE-EPINEPHRINE 1 %-1:100000 IJ SOLN
INTRAMUSCULAR | Status: AC | PRN
  Administered 2020-09-22: 10 mL

## 2020-09-22 MED ORDER — LIDOCAINE-EPINEPHRINE 1 %-1:100000 IJ SOLN
INTRAMUSCULAR | Status: AC
  Filled 2020-09-22: qty 1

## 2020-09-22 MED ORDER — FENTANYL CITRATE (PF) 100 MCG/2ML IJ SOLN
INTRAMUSCULAR | Status: AC
  Filled 2020-09-22: qty 2

## 2020-09-22 MED ORDER — FENTANYL CITRATE (PF) 100 MCG/2ML IJ SOLN
INTRAMUSCULAR | Status: AC | PRN
  Administered 2020-09-22: 50 ug via INTRAVENOUS

## 2020-09-22 NOTE — Progress Notes (Signed)
Pharmacy Antibiotic Note  Steve [name] is a 65 y.o. male admitted on 09/19/2020 with possible pneumonia, found to have staph epidermidis bacteremia. Pharmacy has been consulted for Vancomycin and Cefepime dosing.  Day #3 full abx  Vancomycin peak = 37 Vancomycin trough = 10 Calculated AUC = 527 (goal 400-550)  Plan: Continue Vancomycin 750mg  IV q12h. Continue to monitor renal function, recheck levels as needed Cefepime 2g IV q8h.  Weight: 63.3 kg (139 lb 8.8 oz)  Temp (24hrs), Avg:98.1 F (36.7 C), Min:97.8 F (36.6 C), Max:98.4 F (36.9 C)  Recent Labs  Lab 09/18/20 1212 09/19/20 1511 09/20/20 0708 09/20/20 1015 09/22/20 0536 09/22/20 1643  WBC 18.6 Repeated and verified X2.* 19.0*  --  24.9* 17.2*  --   CREATININE 0.53 0.43* 0.55*  --  0.51*  --   LATICACIDVEN  --  0.7  --   --   --   --   VANCOTROUGH  --   --   --   --   --  10*  VANCOPEAK  --   --   --   --  37  --      Estimated Creatinine Clearance: 81.1 mL/min (A) (by C-G formula based on SCr of 0.51 mg/dL (L)).    No Known Allergies  Antimicrobials this admission: 6/11 Vancomycin >> 6/12, resumed 6/12 >> 6/11 Cefepime >>  Dose adjustments this admission: 6/14 0536 VP: 37, 1530 VT: 10  Microbiology results: 6/11 BCx:  6/11 UCx:  6/11 Sputum:   6/11 MRSA PCR:  6/11 Strep pneumo urinary antigen: neg 6/11 Legionella urinary antigen: neg   Thank you for allowing pharmacy to be a part of this patient's care.  Peggyann Juba, PharmD, BCPS Pharmacy: [phone] 09/22/2020 7:10 PM

## 2020-09-22 NOTE — Progress Notes (Signed)
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  For 3 weeks he had done good but 5 days prior to presentation patient started having poor appetite malaise and failure to thrive with increased concern for pneumonia.  Patient then presented to hospital.  Patient does have baseline x-ray abnormalities from previous COVID so it was difficult to see the change.  Patient was started empirically on vancomycin and cefepime and was admitted to hospital.  Oncology team was consulted as well.  Assessment & Plan:   Active Problems:   Failure to thrive (child)  Possible pneumonia: Patient has history of chronic respiratory failure on 8 L of oxygen at baseline status post tracheostomy.  History of recurrent pneumonia with prior history of COVID and pulmonary fibrosis.  Patient was on prolonged ventilation for almost 8 months last year.  Chest x-ray this time shows possible recurrent pneumonia but difficult to interpret.   CT scan of the chest was performed which showed interval increase in bulk of adenopathy in the metastasis consistent with known lymphoma with increased axillary lymph nodes.  Unchanged left pleural fluid and fibrosis.  Leukocytosis at 24.9.  Seen by speech therapy and has mild aspiration risk.  Recommended regular solids with thin liquids.  Continue cefepime and vancomycin  Staph epidermidis bacteremia.  Both bottles are positive.  Likely true bacteremia.  Continue with vancomycin.  Infectious disease has been consulted.  Patient does have a  Port-A-Cath.  ID recommends removal of Port-A-Cath.  IR has been consulted.  Failure to thrive: With significant anorexia from advanced lymphoma.  Patient has Hodgkin's lymphoma.  Progressive malignancy.  Chronic respiratory failure/pulmonary fibrosis: -History of COVID, intubation for very long time (8 months) and tracheostomy. Patient uses 8 L of oxygen at home with tracheostomy collar.  Currently needing 10 L/min saturating 95%..  Hodgkin's lymphoma: CT scan concerning for worsening lymphadenopathy and progression.  Oncology on board.  Management as per oncology.     Diabetes mellitus: Continue sliding-scale insulin, Accu-Cheks, diabetic diet.  Severe protein calorie malnutrition: Present on admission.  Nutritional supplements as per dietary services.    Hyponatremia: Mild.  Possible SIADH.  Continue to monitor.  Ethics/ goals of care.  Patient appears to be very deconditioned fever with progressive lymphoma now with infection.  I think the prognosis is poor in the situation.   DVT prophylaxis:  Subcutaneous Lovenox  Code Status: Full code  Family Communication:  Spoke with the patient's spouse Ms. Alroy Dust on the phone and updated her about the clinical condition of the patient and plan for port removal as recommended by ID.  Patient's spouse was very upset about the plan and stated that she would find a way to keep him alive, discuss with Dr. Marin Olp and hung up the phone.  Disposition Plan:  Status is: Inpatient   Remains inpatient appropriate because:IV treatments appropriate due to intensity of illness or inability to take PO and Inpatient level of care appropriate due to severity of illness, bacteremia   Dispo: The patient is from: Home  Anticipated d/c is to: Home PT.  We will get PT evaluation.              Patient currently is not medically stable to d/c.              Difficult to place patient No   Consultants:  Hematology/oncology   Procedures:   None.  Antimicrobials:   IV vancomycin  Intravenous cefepime.   Subjective: Patient was seen and examined at bedside.  Patient appears to be extremely weak with some shortness of breath.  Denies any pain, nausea, vomiting.  Objective: Vitals:   09/21/20 2008 09/22/20 0447 09/22/20 0621 09/22/20 0802  BP: 109/70  102/66   Pulse: (!) 102 95 98   Resp: 15 (!) 26 (!) 25   Temp: 98.2 F (36.8 C)  97.8 F (36.6 C)   TempSrc: Oral  Oral   SpO2: 97% 96% 97% 95%  Weight:        Intake/Output Summary (Last 24 hours) at 09/22/2020 1128 Last data filed at 09/22/2020 0555 Gross per 24 hour  Intake 421.17 ml  Output 1350 ml  Net -928.83 ml    Filed Weights   09/19/20 1930  Weight: 63.3 kg    Physical examination: General: Patient is cachectic, in mild distress chronically ill and deconditioned.   HENT:   No scleral pallor or icterus noted. Oral mucosa is moist.  Tracheostomy collar in place with supplemental oxygen Chest: Diminished breath sounds bilaterally.  Coarse breath sounds noted. CVS: S1 &S2 heard. No murmur.  Regular rate and rhythm. Abdomen: Soft, nontender, nondistended.  Bowel sounds are heard.  PEG tube in place. Extremities: No cyanosis, clubbing or edema.  Peripheral pulses are palpable. Psych: Alert, awake and oriented, normal mood CNS:  No cranial nerve deficits.  Moves all extremities but generalized weakness noted. Skin: Warm and dry.  No rashes noted.   Data Reviewed: I have personally reviewed following labs and imaging studies  CBC: Recent Labs  Lab 09/18/20 1212 09/19/20 1511 09/20/20 1015 09/22/20 0536  WBC 18.6 Repeated and verified X2.* 19.0* 24.9* 17.2*  NEUTROABS 16.5* 15.1* 21.8*  --   HGB 10.8* 10.2* 10.2* 9.1*  HCT 33.9* 33.1* 33.3* 29.8*  MCV 90.6 93.2 93.5 97.1  PLT 384.0 313 314 096    Basic Metabolic Panel: Recent Labs  Lab 09/18/20 1212 09/19/20 1511 09/20/20 0708 09/22/20 0536  NA 135 131* 135 138  K 5.9 No hemolysis  seen* 4.4 4.7 4.1  CL 82* 81* 87* 92*  CO2 49* 40* 43* 43*  GLUCOSE 391* 223* 254* 326*  BUN 24* 23 25* 25*  CREATININE 0.53 0.43* 0.55* 0.51*  CALCIUM 9.9 9.2 9.6 9.3  MG  --   --  1.9 2.1  PHOS  --   --   --  2.7    GFR: Estimated Creatinine Clearance: 81.1 mL/min (A) (by C-G formula based on SCr of 0.51 mg/dL (L)). Liver Function Tests: Recent Labs  Lab 09/18/20 1212 09/19/20 1511 09/20/20 0708  AST 17 15 15   ALT 34 28 30  ALKPHOS 113 83 82  BILITOT 0.6 0.6 0.8  PROT 6.5 6.3* 6.7  ALBUMIN 3.0* 2.3* 2.2*    No results for input(s): LIPASE, AMYLASE in the last 168 hours. No results for input(s): AMMONIA in the last 168 hours. Coagulation Profile: No results for input(s): INR, PROTIME in the last 168 hours. Cardiac Enzymes: Recent Labs  Lab 09/19/20 1511  CKTOTAL 11*    BNP (last  3 results) No results for input(s): PROBNP in the last 8760 hours. HbA1C: No results for input(s): HGBA1C in the last 72 hours.  CBG: Recent Labs  Lab 09/21/20 2228 09/22/20 0749  GLUCAP 208* 310*   Lipid Profile: No results for input(s): CHOL, HDL, LDLCALC, TRIG, CHOLHDL, LDLDIRECT in the last 72 hours. Thyroid Function Tests: Recent Labs    09/20/20 0708  TSH 0.862    Anemia Panel: No results for input(s): VITAMINB12, FOLATE, FERRITIN, TIBC, IRON, RETICCTPCT in the last 72 hours.  Urine analysis:    Component Value Date/Time   COLORURINE YELLOW 09/19/2020 1511   APPEARANCEUR HAZY (A) 09/19/2020 1511   LABSPEC 1.012 09/19/2020 1511   PHURINE 7.0 09/19/2020 1511   GLUCOSEU NEGATIVE 09/19/2020 1511   HGBUR NEGATIVE 09/19/2020 1511   HGBUR negative 03/23/2007 0000   BILIRUBINUR NEGATIVE 09/19/2020 1511   BILIRUBINUR neg 11/11/2016 1017   KETONESUR NEGATIVE 09/19/2020 1511   PROTEINUR NEGATIVE 09/19/2020 1511   UROBILINOGEN 0.2 11/11/2016 1017   UROBILINOGEN 0.2 11/08/2012 0915   NITRITE NEGATIVE 09/19/2020 1511   LEUKOCYTESUR NEGATIVE 09/19/2020 1511   Sepsis  Labs: @LABRCNTIP (procalcitonin:4,lacticidven:4)  ) Recent Results (from the past 240 hour(s))  Urine culture     Status: Abnormal   Collection Time: 09/19/20  3:11 PM   Specimen: Urine, Clean Catch  Result Value Ref Range Status   Specimen Description   Final    URINE, CLEAN CATCH Performed at [facility], [place] [address] [place], [place] [address]    Special Requests   Final    NONE Performed at [facility], [place] [address], [place], [place] [address]    Culture MULTIPLE SPECIES PRESENT, SUGGEST RECOLLECTION (A)  Final   Report Status 09/21/2020 FINAL  Final  Culture, blood (routine x 2)     Status: Abnormal   Collection Time: 09/19/20  3:11 PM   Specimen: BLOOD  Result Value Ref Range Status   Specimen Description   Final    BLOOD PORTA CATH Performed at [place] [address] [place], [place] [address]    Special Requests   Final    BOTTLES DRAWN AEROBIC AND ANAEROBIC Blood Culture results may not be optimal due to an excessive volume of blood received in culture bottles Performed at [place] [address] [place], [place] [address]    Culture  Setup Time   Final    GRAM POSITIVE COCCI IN BOTH AEROBIC AND ANAEROBIC BOTTLES CRITICAL RESULT CALLED TO, READ BACK BY AND VERIFIED WITH: M,SWAYNE PHARMD @1552  09/20/20 EB Performed at [place] [address], [place], [place] [address]    Culture STAPHYLOCOCCUS EPIDERMIDIS (A)  Final   Report Status 09/22/2020 FINAL  Final   Organism ID, Bacteria STAPHYLOCOCCUS EPIDERMIDIS  Final      Susceptibility   Staphylococcus epidermidis - MIC*    CIPROFLOXACIN >=8 RESISTANT Resistant     ERYTHROMYCIN >=8 RESISTANT Resistant     GENTAMICIN 8 INTERMEDIATE Intermediate     OXACILLIN >=4 RESISTANT Resistant     TETRACYCLINE >=16 RESISTANT Resistant     VANCOMYCIN 1 SENSITIVE Sensitive     TRIMETH/SULFA 80 RESISTANT Resistant      CLINDAMYCIN >=8 RESISTANT Resistant     RIFAMPIN <=0.5 SENSITIVE Sensitive     Inducible Clindamycin NEGATIVE Sensitive     * STAPHYLOCOCCUS EPIDERMIDIS  Blood Culture ID Panel (Reflexed)     Status: Abnormal   Collection Time: 09/19/20  3:11 PM  Result Value Ref Range Status   Enterococcus faecalis NOT DETECTED NOT DETECTED Final   Enterococcus Faecium NOT DETECTED NOT DETECTED Final   Listeria monocytogenes NOT DETECTED NOT DETECTED Final   Staphylococcus species DETECTED (A) NOT DETECTED Final    Comment: CRITICAL RESULT CALLED TO, READ BACK BY AND VERIFIED WITH: M,SWAYNE PHARMD @1552  09/20/20 EB    Staphylococcus aureus (BCID) NOT DETECTED NOT DETECTED Final   Staphylococcus epidermidis DETECTED (A) NOT DETECTED Final    Comment: Methicillin (oxacillin) resistant coagulase negative staphylococcus. Possible blood culture contaminant (unless isolated from more than one blood culture draw or clinical case suggests pathogenicity). No antibiotic treatment is indicated for blood  culture contaminants. CRITICAL RESULT CALLED TO, READ BACK BY AND VERIFIED WITH: M,SWAYNE PHARMD @1552  09/20/20 EB    Staphylococcus lugdunensis NOT DETECTED NOT DETECTED Final   Streptococcus species NOT DETECTED NOT DETECTED Final   Streptococcus agalactiae NOT DETECTED NOT DETECTED Final   Streptococcus pneumoniae NOT DETECTED NOT DETECTED Final   Streptococcus pyogenes NOT DETECTED NOT DETECTED Final   A.calcoaceticus-baumannii NOT DETECTED NOT DETECTED Final   Bacteroides fragilis NOT DETECTED NOT DETECTED Final   Enterobacterales NOT DETECTED NOT DETECTED Final   Enterobacter cloacae complex NOT DETECTED NOT DETECTED Final   Escherichia coli NOT DETECTED NOT DETECTED Final   Klebsiella aerogenes NOT DETECTED NOT DETECTED Final   Klebsiella oxytoca NOT DETECTED NOT DETECTED Final   Klebsiella pneumoniae NOT DETECTED NOT DETECTED Final   Proteus species NOT DETECTED NOT DETECTED Final   Salmonella species  NOT DETECTED NOT DETECTED Final   Serratia marcescens NOT DETECTED NOT DETECTED Final   Haemophilus influenzae NOT DETECTED NOT DETECTED Final   Neisseria meningitidis NOT DETECTED NOT DETECTED Final   Pseudomonas aeruginosa NOT DETECTED NOT DETECTED Final   Stenotrophomonas maltophilia NOT DETECTED NOT DETECTED Final   Candida albicans NOT DETECTED NOT DETECTED Final   Candida auris NOT DETECTED NOT DETECTED Final   Candida glabrata NOT DETECTED NOT DETECTED Final   Candida krusei NOT DETECTED NOT DETECTED Final   Candida parapsilosis NOT DETECTED NOT DETECTED Final   Candida tropicalis NOT DETECTED NOT DETECTED Final   Cryptococcus neoformans/gattii NOT DETECTED NOT DETECTED Final   Methicillin resistance mecA/C DETECTED (A) NOT DETECTED Final    Comment: CRITICAL RESULT CALLED TO, READ BACK BY AND VERIFIED WITH: M,SWAYNE PHARMD @1552  09/20/20 EB Performed at [facility], [address], [place], [place] [address]   Culture, blood (routine x 2)     Status: Abnormal   Collection Time: 09/19/20  3:16 PM   Specimen: BLOOD  Result Value Ref Range Status   Specimen Description   Final    BLOOD SITE NOT SPECIFIED Performed at [place] [address] [place], [place] [address]    Special Requests   Final    BOTTLES DRAWN AEROBIC AND ANAEROBIC Blood Culture adequate volume Performed at [place] [address], [place], [place] [address]    Culture  Setup Time   Final    GRAM POSITIVE COCCI IN BOTH AEROBIC AND ANAEROBIC BOTTLES CRITICAL VALUE NOTED.  VALUE IS CONSISTENT WITH PREVIOUSLY REPORTED AND CALLED VALUE.    Culture (A)  Final    STAPHYLOCOCCUS EPIDERMIDIS SUSCEPTIBILITIES PERFORMED ON PREVIOUS CULTURE WITHIN THE LAST 5 DAYS. Performed at [facility], [place] [address] [place], [place] [address]    Report Status 09/22/2020 FINAL  Final  Resp Panel by RT-PCR (Flu A&B, Covid)  Nasopharyngeal Swab     Status: None    Collection Time: 09/19/20  3:49 PM   Specimen: Nasopharyngeal Swab; Nasopharyngeal(NP) swabs in vial transport medium  Result Value Ref Range Status   SARS Coronavirus 2 by RT PCR NEGATIVE NEGATIVE Final    Comment: (NOTE) SARS-CoV-2 target nucleic acids are NOT DETECTED.  The SARS-CoV-2 RNA is generally detectable in upper respiratory specimens during the acute phase of infection. The lowest concentration of SARS-CoV-2 viral copies this assay can detect is 138 copies/mL. A negative result does not preclude SARS-Cov-2 infection and should not be used as the sole basis for treatment or other patient management decisions. A negative result may occur with  improper specimen collection/handling, submission of specimen other than nasopharyngeal swab, presence of viral mutation(s) within the areas targeted by this assay, and inadequate number of viral copies(<138 copies/mL). A negative result must be combined with clinical observations, patient history, and epidemiological information. The expected result is Negative.  Fact Sheet for Patients:  EntrepreneurPulse.com.au  Fact Sheet for Healthcare Providers:  IncredibleEmployment.be  This test is no t yet approved or cleared by the [place] FDA and  has been authorized for detection and/or diagnosis of SARS-CoV-2 by FDA under an Emergency Use Authorization (EUA). This EUA will remain  in effect (meaning this test can be used) for the duration of the COVID-19 declaration under Section 564(b)(1) of the Act, 21 U.S.C.section 360bbb-3(b)(1), unless the authorization is terminated  or revoked sooner.       Influenza A by PCR NEGATIVE NEGATIVE Final   Influenza B by PCR NEGATIVE NEGATIVE Final    Comment: (NOTE) The Xpert Xpress SARS-CoV-2/FLU/RSV plus assay is intended as an aid in the diagnosis of influenza from Nasopharyngeal swab specimens and should not be used as a sole basis for treatment.  Nasal washings and aspirates are unacceptable for Xpert Xpress SARS-CoV-2/FLU/RSV testing.  Fact Sheet for Patients: EntrepreneurPulse.com.au  Fact Sheet for Healthcare Providers: IncredibleEmployment.be  This test is not yet approved or cleared by the [place] FDA and has been authorized for detection and/or diagnosis of SARS-CoV-2 by FDA under an Emergency Use Authorization (EUA). This EUA will remain in effect (meaning this test can be used) for the duration of the COVID-19 declaration under Section 564(b)(1) of the Act, 21 U.S.C. section 360bbb-3(b)(1), unless the authorization is terminated or revoked.  Performed at [facility], [place] [address] [place], [place] [address]   MRSA PCR Screening     Status: None   Collection Time: 09/20/20  6:50 AM   Specimen: Nasopharyngeal  Result Value Ref Range Status   MRSA by PCR NEGATIVE NEGATIVE Final    Comment:        The GeneXpert MRSA Assay (FDA approved for NASAL specimens only), is one component of a comprehensive MRSA colonization surveillance program. It is not intended to diagnose MRSA infection nor to guide or monitor treatment for MRSA infections. Performed at [facility], [place] [address] [place],  [address]          Radiology Studies: CT CHEST WO CONTRAST  Result Date: 09/20/2020 CLINICAL DATA:  Respiratory failure.  History of Hodgkin's lymphoma. EXAM: CT CHEST WITHOUT CONTRAST TECHNIQUE: Multidetector CT imaging of the chest was performed following the standard protocol without IV contrast. COMPARISON:  July 21, 2020 FINDINGS: Cardiovascular: Heart is mildly enlarged. LEFT chest port tip terminates at the superior cavoatrial junction. No pericardial effusion. Minimal atherosclerotic calcifications. Enlarged pulmonary artery as can be seen  in pulmonary arterial hypertension. Mediastinum/Nodes: Revisualization of extensive  adenopathy. LEFT level 3 axillary lymph node measures 11 mm, previously 3 mm (series 2, image 42). LEFT supraclavicular lymph node measures 15 mm, previously 15 mm (series 2, image 24). RIGHT level 1 axillary lymph node measures 8 mm, previously 4 mm (series 2, image 63). RIGHT internal mammary lymph node measures 31 mm, previously 26 mm (series 2, image 77). RIGHT paratracheal conglomeration spanned 59 x 43 mm, previously 44 by 30 mm (series 2, image 45). There is a lunate configuration of the trachea. Lungs/Pleura: Visualization of a peripheral rind of fluid adjacent to the RIGHT lung, similar in comparison to prior. Extensive bilateral ground-glass opacities with subpleural reticulation and suggestion of honeycombing. Peribronchovascular nodularity is similar in comparison to prior. Similar appearance of RIGHT middle lobe atelectasis. Upper Abdomen: No acute abnormality. Musculoskeletal: Known osseous metastatic disease is not well visualized. Limited evaluation of the ribs secondary to respiratory motion. No acute thoracic spine fracture. IMPRESSION: 1. Interval increase in bulky adenopathy within the mediastinum consistent with known lymphoma. Increased axillary lymph node size in comparison to prior. 2. Revisualization of sequela of advanced pulmonary fibrosis. Unchanged appearance of peribronchovascular nodularity. This could reflect lymphangitic spread of lymphoma versus underlying infection. 3. Unchanged small rind of RIGHT-sided pleural fluid. Underlying pleural nodularity from lymphoma remains in the differential. 4. 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 5. Enlarged pulmonary artery as can be seen in pulmonary arterial hypertension. Electronically Signed   By: Valentino Saxon MD   On: 09/20/2020 17:25   ECHOCARDIOGRAM COMPLETE  Result Date: 09/21/2020    ECHOCARDIOGRAM REPORT   Patient Name:   Steve [name] [facility] Date of Exam: 09/21/2020 Medical Rec #:   [id number]         Height:       65.0 in Accession #:    [id number]        Weight:       139.6 lb Date of Birth:  Feb 09, 1956          BSA:          1.698 m Patient Age:    54 years          BP:           103/66 mmHg Patient Gender: M                 HR:           95 bpm. Exam Location:  Inpatient Procedure: 2D Echo, 3D Echo, Cardiac Doppler and Color Doppler Indications:    Bacteremia  History:        Patient has prior history of Echocardiogram examinations, most                 recent 07/09/2019. Abnormal ECG, Arrythmias:Tachycardia,                 Signs/Symptoms:Bacteremia, Shortness of Breath and Dyspnea; Risk                 Factors:Sleep Apnea, Hypertension and Diabetes. Cancer. Right                 pneumothorax. Subcutaneous air. Hypoxia.  Sonographer:    Roseanna Rainbow RDCS Referring Phys: [place] Comments: Technically difficult study due to poor echo windows, suboptimal parasternal window, suboptimal apical window and suboptimal subcostal window. Patient has trach collar. Off aix medial images in apical region. Feeding tube in subcostal region.  IMPRESSIONS  1. Left ventricular ejection fraction, by estimation, is 60 to 65%. The left ventricle has normal function. The left ventricle has no regional wall motion abnormalities. There is mild left ventricular hypertrophy. Left ventricular diastolic parameters were normal.  2. Right ventricular systolic function is normal. The right ventricular size is moderately enlarged. There is normal pulmonary artery systolic pressure.  3. The mitral valve is normal in structure. No evidence of mitral valve regurgitation.  4. The aortic valve was not well visualized. Aortic valve regurgitation is not visualized. No aortic stenosis is present.  5. The inferior vena cava is normal in size with greater than 50% respiratory variability, suggesting right atrial pressure of 3 mmHg. Conclusion(s)/Recommendation(s): Techincally difficult study, but no vegetaion  seen. If high clinical supicion for endocarditis, consider TEE. FINDINGS  Left Ventricle: Left ventricular ejection fraction, by estimation, is 60 to 65%. The left ventricle has normal function. The left ventricle has no regional wall motion abnormalities. The left ventricular internal cavity size was small. There is mild left ventricular hypertrophy. Left ventricular diastolic parameters were normal. Right Ventricle: The right ventricular size is moderately enlarged. Right vetricular wall thickness was not well visualized. Right ventricular systolic function is normal. There is normal pulmonary artery systolic pressure. The tricuspid regurgitant velocity is 2.78 m/s, and with an assumed right atrial pressure of 3 mmHg, the estimated right ventricular systolic pressure is 38.4 mmHg. Left Atrium: Left atrial size was normal in size. Right Atrium: Right atrial size was normal in size. Pericardium: There is no evidence of pericardial effusion. Mitral Valve: The mitral valve is normal in structure. No evidence of mitral valve regurgitation. Tricuspid Valve: The tricuspid valve is normal in structure. Tricuspid valve regurgitation is trivial. Aortic Valve: The aortic valve was not well visualized. Aortic valve regurgitation is not visualized. No aortic stenosis is present. Pulmonic Valve: The pulmonic valve was not well visualized. Pulmonic valve regurgitation is not visualized. Aorta: The aortic root is normal in size and structure. Venous: The inferior vena cava is normal in size with greater than 50% respiratory variability, suggesting right atrial pressure of 3 mmHg. IAS/Shunts: The interatrial septum was not well visualized.  LEFT VENTRICLE PLAX 2D LVIDd:         3.60 cm      Diastology LVIDs:         2.30 cm      LV e' medial:    11.20 cm/s LV PW:         1.30 cm      LV E/e' medial:  8.5 LV IVS:        0.90 cm      LV e' lateral:   12.00 cm/s LVOT diam:     2.20 cm      LV E/e' lateral: 7.9 LV SV:         57 LV SV  Index:   34 LVOT Area:     3.80 cm  LV Volumes (MOD) LV vol d, MOD A2C: 66.9 ml LV vol d, MOD A4C: 101.0 ml LV vol s, MOD A2C: 22.7 ml LV vol s, MOD A4C: 24.2 ml LV SV MOD A2C:     44.2 ml LV SV MOD A4C:     101.0 ml LV SV MOD BP:      78.9 ml RIGHT VENTRICLE             IVC RV S prime:     18.20 cm/s  IVC diam: 1.80 cm TAPSE (M-mode):  2.0 cm LEFT ATRIUM           Index       RIGHT ATRIUM           Index LA diam:      3.40 cm 2.00 cm/m  RA Area:     15.10 cm LA Vol (A2C): 13.3 ml 7.83 ml/m  RA Volume:   34.90 ml  20.56 ml/m LA Vol (A4C): 28.1 ml 16.55 ml/m  AORTIC VALVE LVOT Vmax:   126.00 cm/s LVOT Vmean:  71.400 cm/s LVOT VTI:    0.150 m  AORTA Ao Root diam: 3.40 cm Ao Asc diam:  3.70 cm MITRAL VALVE               TRICUSPID VALVE MV Area (PHT): 5.19 cm    TR Peak grad:   30.9 mmHg MV Decel Time: 146 msec    TR Vmax:        278.00 cm/s MV E velocity: 95.14 cm/s MV A velocity: 84.80 cm/s  SHUNTS MV E/A ratio:  1.12        Systemic VTI:  0.15 m                            Systemic Diam: 2.20 cm Oswaldo Milian MD Electronically signed by Oswaldo Milian MD Signature Date/Time: 09/21/2020/6:08:46 PM    Final        Scheduled Meds:  Chlorhexidine Gluconate Cloth  6 each Topical Daily   cholecalciferol  10,000 Units Oral Daily   enoxaparin (LOVENOX) injection  40 mg Subcutaneous Q24H   feeding supplement (PROSource TF)  45 mL Per Tube BID   hydrocortisone sod succinate (SOLU-CORTEF) inj  50 mg Intravenous Q8H   insulin aspart  0-5 Units Subcutaneous QHS   insulin aspart  0-9 Units Subcutaneous TID WC   midodrine  5 mg Oral TID WC   multivitamin  15 mL Per Tube Daily   pantoprazole sodium  40 mg Per Tube Daily   sertraline  50 mg Oral Daily   traZODone  100 mg Per Tube QHS   Continuous Infusions:  ceFEPime (MAXIPIME) IV 2 g (09/22/20 0821)   feeding supplement (OSMOLITE 1.2 CAL) 1,000 mL (09/21/20 2322)   vancomycin 750 mg (09/22/20 0413)     LOS: 3 days    Flora Lipps,  MD  [facility] 7PM-7AM contact night coverage as above

## 2020-09-22 NOTE — TOC Initial Note (Signed)
Transition of Care [facility]) - Initial/Assessment Note   Patient Details  Name: Steve [name] MRN: [id number] Date of Birth: [birth year]  [facility] CM/SW Contact:    Sherie Don, LCSW Phone Number: 09/22/2020, 1:31 PM  Clinical Narrative: [name] who was admitted for failure to thrive. Readmission checklist completed due to high readmission score.  CSW spoke with patient's wife to complete assessment as patient was asleep. Per wife, patient resides at home with her, two daughters, and a brother. Patient currently requires extensive assistance with ADLs. Patient is able to afford his medications and he takes these as prescribed. DME includes O2 from [employer], rolling walker, hospital bed, wheelchair, O2 monitor, nebulizer, and kangaroo feeding pump. Patient also has wheelchair ramps for the home. Patient is active with [facility] for HHPT. Wife and brother provide transportation to medical appointments. TOC to follow.  Expected Discharge Plan: [place] Barriers to Discharge: Continued Medical Work up  Patient Goals and CMS Choice Patient states their goals for this hospitalization and ongoing recovery are:: Return home and get stronger CMS Medicare.gov Compare Post Acute Care list provided to:: Patient Represenative (must comment) Choice offered to / list presented to : Spouse  Expected Discharge Plan and Services Expected Discharge Plan: [place] In-house Referral: Clinical Social Work Discharge Planning Services: CM Consult Post Acute Care Choice: [place] arrangements for the past 2 months: Mobile Home              DME Arranged: N/A DME Agency: NA HH Arranged: PT [place] Agency: [place] ([facility]) Date HH Agency Contacted: 09/22/20 Representative spoke with at [place]: Ramond Marrow  Prior Living Arrangements/Services Living arrangements for the past 2 months: Mobile Home Lives with::  Spouse Patient language and need for interpreter reviewed:: Yes Do you feel safe going back to the place where you live?: Yes      Need for Family Participation in Patient Care: Yes (Comment) Care giver support system in place?: Yes (comment) Current home services: DME (O2 (from [employer]), trach supplies, rolling walker, wheelchair, nebulizer, kangroo feeding pump) Criminal Activity/Legal Involvement Pertinent to Current Situation/Hospitalization: No - Comment as needed  Activities of Daily Living Home Assistive Devices/Equipment: CBG Meter, Feeding equipment, Hospital bed, Eyeglasses, Vent/Trach supplies, Environmental consultant (specify type), Wheelchair, Other (Comment), Nebulizer, Oxygen, Dentures (specify type) (ramp entrance, front wheeled walker, upper/lower dentures, suction machine, aflo vest, peg) ADL Screening (condition at time of admission) Patient's cognitive ability adequate to safely complete daily activities?: Yes Is the patient deaf or have difficulty hearing?: No Does the patient have difficulty seeing, even when wearing glasses/contacts?: No Does the patient have difficulty concentrating, remembering, or making decisions?: No Patient able to express need for assistance with ADLs?: Yes Does the patient have difficulty dressing or bathing?: Yes Independently performs ADLs?: No Communication: Independent Dressing (OT): Needs assistance Is this a change from baseline?: Pre-admission baseline Grooming: Independent Feeding: Independent Bathing: Needs assistance Is this a change from baseline?: Pre-admission baseline Toileting: Needs assistance Is this a change from baseline?: Pre-admission baseline In/Out Bed: Needs assistance Is this a change from baseline?: Pre-admission baseline Walks in Home: Needs assistance Is this a change from baseline?: Pre-admission baseline Does the patient have difficulty walking or climbing stairs?: Yes Weakness of Legs: Both Weakness of Arms/Hands:  None  Permission Sought/Granted Permission sought to share information with : Other (comment) Permission granted to share information with : Yes, Verbal Permission Granted  Permission granted to share info w AGENCY: [facility]  Emotional Assessment Appearance:: Appears stated age Alcohol / Substance Use: Not Applicable Psych Involvement: No (comment)  Admission diagnosis:  Failure to thrive (child) [R62.51] Transaminitis [R74.01] Patient Active Problem List   Diagnosis Date Noted   Failure to thrive (child) 09/19/2020   Sepsis due to pneumonia ([place]) 08/13/2020   Acute on chronic respiratory failure with hypoxia and hypercapnia ([place]) 07/21/2020   DM (diabetes mellitus), type 2 ([place]) 07/15/2020   Pneumonia 07/14/2020   Pulmonary fibrosis ([place]) 05/26/2020   Bronchiectasis ([place]) 05/26/2020   Acute hypercapnic respiratory failure ([place]) 05/26/2020   Acute on chronic respiratory failure with hypoxia ([place]) 05/25/2020   Uncontrolled type 2 diabetes mellitus with hyperglycemia, with long-term current use of insulin ([place]) 05/25/2020   Essential hypertension 05/25/2020   GERD without esophagitis 05/25/2020   Debility    Anxiety    Tracheostomy care ([place])    Pneumothorax on right 03/23/2020   Chronic respiratory failure with hypoxia ([name])    ARDS (adult respiratory distress syndrome) ([place])    Hodgkin's lymphoma ([place])    Pneumothorax, acute    Healthcare-associated pneumonia    COVID-19 virus infection    On mechanically assisted ventilation ([place])    Dysphagia    Pneumothorax    Subcutaneous air ([place])    Tracheostomy dependence ([place])    Pneumomediastinum ([place])    Acute respiratory distress syndrome (ARDS) due to COVID-19 virus ([place])    Pressure injury of skin 07/27/2019   Acute respiratory failure with hypoxemia ([name])    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[place])    Malnutrition of moderate degree 07/10/2019   HCAP (healthcare-associated pneumonia)    Hypoxia     Febrile neutropenia ([place]) 05/17/2019   COVID-19 05/17/2019   Hyponatremia 05/17/2019   Protein-calorie malnutrition, severe ([place]) 05/17/2019   Neutropenic fever ([place]) 05/16/2019   Chemotherapy-induced neuropathy ([place]) 04/22/2019   Chemotherapy-induced diarrhea 04/22/2019   Anemia due to antineoplastic chemotherapy 03/25/2019   Hodgkin's lymphoma ([place]) 02/25/2019   Abnormal CT scan, pancreas - tail area 01/31/2013   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   Septic arthritis of hip ([place]) 09/05/2012   Routine general medical examination at a health care facility 09/12/2011   Elbow pain 09/12/2011   Abscess of muscle 08/10/2011   H/O dental abscess 08/10/2011   Staphylococcus aureus bacteremia 07/27/2011   Abdominal pain, RLQ 09/02/2010   HYPOGONADISM, MALE 03/23/2007   SLEEP APNEA 03/23/2007   PCP:  [name], PA-C Pharmacy:   [id number] [address], [place] [address] Phone: [phone] Fax: [phone]  Readmission Risk Interventions Readmission Risk Prevention Plan 09/22/2020 08/24/2020 07/22/2019  Transportation Screening Complete Complete Complete  Medication Review Press photographer) Complete Complete Complete  HRI or Home Care Consult Complete Complete -  SW Recovery Care/Counseling Consult Complete Complete -  Palliative Care Screening Not Applicable Not Applicable -  [place] Not Applicable Not Applicable -  Some recent data might be hidden

## 2020-09-22 NOTE — Progress Notes (Signed)
Patient had his [place] this weekend, but was admitted to the hospital later that day. He is currently being treated for infection. Additional scans show progressive disease. Plan will now be for chemo once patient recovers. Will follow for post discharge needs and treatment scheduling.   Oncology Nurse Navigator Documentation  Oncology Nurse Navigator Flowsheets 09/22/2020  Abnormal Finding Date -  Confirmed Diagnosis Date -  Diagnosis Status -  Planned Course of Treatment -  Phase of Treatment -  Chemotherapy Pending- Reason: -  Chemotherapy Actual Start Date: -  Navigator Follow Up Date: 09/25/2020  Navigator Follow Up Reason: Appointment Review  Navigator Location [facility]  Referral Date to RadOnc/MedOnc -  Navigator Encounter Type Scan Review;Appt/Treatment Plan Review  Telephone -  Treatment Initiated Date -  Patient Visit Type MedOnc  Treatment Phase Active Tx  Barriers/Navigation Needs Coordination of Care;Education;Employed;Family Concerns;Morbidities/Frailty;Multiple Hospital Admissions  Education -  Interventions None Required  Acuity Level 4-High Needs (Greater Than 4 Barriers Identified)  Referrals -  Coordination of Care -  Education Method -  Support Groups/Services Friends and Family  Time Spent with Patient 15

## 2020-09-22 NOTE — Progress Notes (Addendum)
Inpatient Diabetes Program Recommendations  AACE/ADA: New Consensus Statement on Inpatient Glycemic Control (2015)  Target Ranges:  Prepandial:   less than 140 mg/dL      Peak postprandial:   less than 180 mg/dL (1-2 hours)      Critically ill patients:  140 - 180 mg/dL   Lab Results  Component Value Date   GLUCAP 310 (H) 09/22/2020   HGBA1C 7.2 (H) 09/18/2020    Review of Glycemic Control Results for Steve [name], Steve [name] (MRN [id number]) as of 09/22/2020 09:11  Ref. Range 09/21/2020 22:28 09/22/2020 07:49  Glucose-Capillary Latest Ref Range: 70 - 99 mg/dL 208 (H) 310 (H)   Diabetes history: DM 2 Outpatient Diabetes medications: none Current orders for Inpatient glycemic control:  Novolog 0-9 units tid + hs  Solucortef 50 mg Q8 hours Osmolite 65 ml/hour A1c 7.2% on 6/10  Inpatient Diabetes Program Recommendations:    -  Change Novolog Correction scale to Q4 hours while on Tube Feeds -  Novolog 3 units Q4 hours Tube Feed Coverage  Thanks,  Tama Headings RN, MSN, BC-ADM Inpatient Diabetes Coordinator Team Pager [phone] (8a-5p)

## 2020-09-22 NOTE — Progress Notes (Signed)
SLP Cancellation Note  Patient Details Name: Steve [name] MRN: [id number] DOB: 1955/11/25   Cancelled treatment:       Reason Eval/Treat Not Completed: Other (comment) (pt NPO at this time, note potential [place] A Cath removal and cardiac ultrasound, also noted results of his [place] with disease progression)  Kathleen Lime, MS [facility] SLP Acute Rehab Services Office [phone] Pager [phone]   [name] 09/22/2020, 11:05 AM

## 2020-09-22 NOTE — H&P (Signed)
Chief Complaint: Patient was seen in consultation today for removal of port-a-catheter  Chief Complaint  Patient presents with   Hyperkalemia   at the request of Dr. Flora Lipps  Referring Physician(s): Dr. Louanne Belton, Corrie Mckusick  Supervising Physician: Jacqulynn Cadet  Patient Status: [facility] - In-pt  History of Present Illness: Steve [name] is a 65 y.o. 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  Patient is known to our service for Port-A-Cath placement in December 2020 and gastrostomy tube placement in May 2021.  In the ED, chest x-ray showed unchanged multifocal pneumonia superimposed on chronic severe pulmonary fibrosis and unchanged mediastinal lymphadenopathy.  Ultrasound abdomen revealed biliary sludge without evidence of acute cholecystitis. Blood culture was obtained and patient was admitted to the hospital team due to concern for possible recurrent pneumonia. Blood culture came back positive with Staphylococcus epidermis on 09/22/20, infectious disease recommended a Port-A-Cath to be removed.  IR was requested for removal of a Port-A-Cath.  Patient laying in bed, not in acute distress.  Reports chronic shortness of breath and cough.  Denise headache, fever, chills,  chest pain, abdominal pain, nausea ,vomiting, and bleeding.   Past Medical History:  Diagnosis Date   Abscess of muscle 08/10/2011   staph infection of right hip    Acute on chronic respiratory failure with hypoxia ([name])    Acute respiratory distress syndrome (ARDS) due to COVID-19 virus ([name])    Anxiety    Diabetes mellitus without complication ([name])    GERD (gastroesophageal  reflux disease)    rare reflux - no meds for reflux - NO PROBLEM IN PAST SEVERAL YRS   HCAP (healthcare-associated pneumonia)    Hip dysplasia, congenital    no surgery as a child for hip dysplasia - has had bilateral hip replacements as an adult   Hodgkin lymphoma ([place])    Hypertension    Pancreatitis    Pneumothorax, acute    Postoperative anemia due to acute blood loss 09/07/2012   Septic arthritis of hip ([place]) 09/05/2012   PT'S TOTAL HIP JOINT REMOVED - ANTIBIOTIC SPACE PLACED AND PT HAS FINISHED IV ANTIBIOTICS ( PICC LINE REMOVED)   Sleep apnea    USES CPAP    Past Surgical History:  Procedure Laterality Date   COLONOSCOPY  04/26/2007   HERNIA REPAIR     inguinal hernia x3   IR GASTROSTOMY TUBE MOD SED  08/22/2019   IR IMAGING GUIDED PORT INSERTION  03/29/2019   JOINT REPLACEMENT  2002 & 2007   bilateral hip replacement   LYMPH NODE BIOPSY Right 03/20/2019   Procedure: DEEP RIGHT SUPRACLAVICULAR LYMPH NODE EXCISION;  Surgeon: Fanny Skates, MD;  Location: [place];  Service: General;  Laterality: Right;   MULTIPLE EXTRACTIONS WITH ALVEOLOPLASTY  07/28/2011   Procedure: MULTIPLE EXTRACION WITH ALVEOLOPLASTY;  Surgeon: Lenn Cal, DDS;  Location: [facility];  Service: Oral Surgery;  Laterality: N/A;  Extraction of tooth #'s 2,3,4,5,6,11,12,13,15,19,22 with alveoloplasty.   shoulder repair - right for separation of shoulder     TEE WITHOUT CARDIOVERSION  07/29/2011   Procedure: TRANSESOPHAGEAL ECHOCARDIOGRAM (TEE);  Surgeon: Josue Hector, MD;  Location: [place];  Service: Cardiovascular;  Laterality: N/A;  TOTAL HIP REVISION Right 09/05/2012   Procedure: RIGHT HIP RESECTION ARTHROPLASTY WITH ANTIBIOTIC SPACERS;  Surgeon: [name], MD;  Location: [facility];  Service: Orthopedics;  Laterality: Right;   TOTAL HIP REVISION Right 11/30/2012   Procedure: RIGHT TOTAL HIP ARTHROPLASTY REIMPLANTATION;  Surgeon: [name], MD;  Location: [facility];  Service:  Orthopedics;  Laterality: Right;    Allergies: Patient has no known allergies.  Medications: Prior to Admission medications   Medication Sig Start Date End Date Taking? Authorizing Provider  albuterol (PROVENTIL) (2.5 MG/3ML) 0.083% nebulizer solution Take 3 mLs (2.5 mg total) by nebulization every 2 (two) hours as needed for wheezing. 07/28/20  Yes Saguier, Percell Miller, PA-C  Amino Acids-Protein Hydrolys (FEEDING SUPPLEMENT, PRO-STAT SUGAR FREE 64,) LIQD Place 30 mLs into feeding tube daily.   Yes [provider]  calamine lotion Apply topically as needed for itching. Patient taking differently: Apply 1 application topically daily as needed for itching. 07/18/20  Yes Pokhrel, Laxman, MD  chlorhexidine (PERIDEX) 0.12 % solution 15 mLs by Mouth Rinse route 2 (two) times daily. Patient taking differently: 15 mLs by Mouth Rinse route 2 (two) times daily as needed (mouth pain). 04/17/20  Yes Florencia Reasons, MD  Cholecalciferol (VITAMIN D3) 250 MCG (10000 UT) capsule Take 10,000 Units by mouth daily.   Yes [provider]  guaiFENesin (ROBITUSSIN) 100 MG/5ML SOLN Place 5 mLs (100 mg total) into feeding tube every 4 (four) hours as needed for cough or to loosen phlegm. 07/18/20  Yes Pokhrel, Corrie Mckusick, MD  HYOSYNE 0.125 MG/ML solution Take 0.125 mg by mouth every 4 (four) hours as needed for cramping. 07/20/20  Yes [provider]  lidocaine-prilocaine (EMLA) cream Apply to affected area once Patient taking differently: Apply 1 application topically daily as needed (access port). Apply to affected area once 07/09/20  Yes Ennever, Rudell Cobb, MD  loperamide HCl (IMODIUM) 1 MG/7.5ML suspension Place 15 mLs (2 mg total) into feeding tube as needed for diarrhea or loose stools (maximum 16mg  per day). Patient taking differently: Place 2 mg into feeding tube daily as needed for diarrhea or loose stools (maximum 16mg  per day). 07/20/20  Yes Pokhrel, Laxman, MD  LORazepam (ATIVAN) 0.5 MG tablet Take 1 tablet  (0.5 mg total) by mouth every 6 (six) hours as needed (Nausea or vomiting). 07/09/20  Yes Ennever, Rudell Cobb, MD  midodrine (PROAMATINE) 5 MG tablet Take 1 tablet (5 mg total) by mouth 3 (three) times daily with meals. 08/25/20  Yes Caren Griffins, MD  Multiple Vitamin (MULTIVITAMIN) LIQD Place 15 mLs into feeding tube daily. 08/28/19  Yes Ollis, Cleaster Corin, NP  Nutritional Supplements (FEEDING SUPPLEMENT, OSMOLITE 1.2 CAL,) LIQD Place 1,000 mLs into feeding tube continuous. 04/17/20  Yes Florencia Reasons, MD  ondansetron (ZOFRAN) 4 MG tablet Take 1 tablet (4 mg total) by mouth every 6 (six) hours as needed for nausea. 07/18/20  Yes Pokhrel, Laxman, MD  pantoprazole (PROTONIX) 40 MG tablet Take 40 mg by mouth daily.   Yes [provider]  sertraline (ZOLOFT) 50 MG tablet GIVE 1 TAB PER TUBE DAILY Patient taking differently: Take 50 mg by mouth daily. 07/07/20  Yes Saguier, Percell Miller, PA-C  traZODone (DESYREL) 100 MG tablet PLACE 1 TABLET (100 MG TOTAL) INTO FEEDING TUBE AT BEDTIME. Patient taking differently: Take 100 mg by mouth at bedtime. 08/03/20  Yes Saguier, Percell Miller, PA-C  triamcinolone (KENALOG) 0.1 % Apply 1 application topically 2 (two) times daily as needed (itching).   Yes [provider]  Chlorhexidine Gluconate Cloth 2 % PADS Apply 6 each topically daily. Patient not taking: Reported on 09/20/2020 04/18/20   Florencia Reasons, MD  furosemide (LASIX) 20 MG tablet PLACE 1 TABLET (20 MG TOTAL) INTO FEEDING TUBE DAILY. Patient taking differently: Take 20 mg by mouth daily. 08/26/20   Saguier, Percell Miller, PA-C  pantoprazole sodium (PROTONIX) 40 mg/20 mL PACK Place 20 mLs (40 mg total) into feeding tube daily. Patient not taking: No sig reported 08/25/20 09/24/20  Caren Griffins, MD     Family History  Problem Relation Age of Onset   Melanoma Mother    Heart attack Mother    Hyperlipidemia Neg Hx    Sudden death Neg Hx    Hypertension Neg Hx    Diabetes Neg Hx     Social History   Socioeconomic  History   Marital status: Married    Spouse name: Not on file   Number of children: Not on file   Years of education: Not on file   Highest education level: Not on file  Occupational History   Not on file  Tobacco Use   Smoking status: Never   Smokeless tobacco: Never  Vaping Use   Vaping Use: Never used  Substance and Sexual Activity   Alcohol use: Yes    Comment: rare beer. 5 times a year at most. 2 beers when he drinks.   Drug use: No   Sexual activity: Yes    Partners: Female  Other Topics Concern   Not on file  Social History Narrative   Married, 1 son and 3 sons.   Welder/fabricator   2 caffeinated beverages daily   Social Determinants of Health   Financial Resource Strain: Not on file  Food Insecurity: Not on file  Transportation Needs: Not on file  Physical Activity: Not on file  Stress: Not on file  Social Connections: Not on file     Review of Systems: A 12 point ROS discussed and pertinent positives are indicated in the HPI above.  All other systems are negative.   Vital Signs: BP 102/66 (BP Location: Left Arm)   Pulse 98   Temp 97.8 F (36.6 C) (Oral)   Resp (!) 25   Wt 139 lb 8.8 oz (63.3 kg)   SpO2 95%   BMI 23.22 kg/m   Physical Exam Vitals reviewed.  Constitutional:      General: He is not in acute distress.    Appearance: He is ill-appearing.  HENT:     Head: Normocephalic and atraumatic.     Mouth/Throat:     Mouth: Mucous membranes are moist.  Neck:     Comments: Positive for tracheostomy  Cardiovascular:     Rate and Rhythm: Normal rate and regular rhythm.     Pulses: Normal pulses.     Heart sounds: Normal heart sounds.  Pulmonary:     Comments: Crackles on RLL  Abdominal:     General: Abdomen is flat. Bowel sounds are normal.     Palpations: Abdomen is soft.  Skin:    General: Skin is warm and dry.     Coloration: Skin is not jaundiced or pale.  Neurological:     Mental Status: He is oriented to person, place, and time.   Psychiatric:        Mood and Affect: Mood normal.        Behavior: Behavior normal.        Judgment: Judgment normal.    MD Evaluation  Airway: WNL (Patient has tracheostomy) Heart: WNL Abdomen: WNL Chest/ Lungs: WNL ASA  Classification: 3 Mallampati/Airway Score: One  Imaging: DG Chest 2 View  Result Date: 09/18/2020 CLINICAL DATA:  Acute on chronic respiratory failure. EXAM: CHEST - 2 VIEW COMPARISON:  Aug 15, 2020. FINDINGS: Stable cardiomegaly. Tracheostomy tube is in good position. Left-sided Port-A-Cath is unchanged. Stable bilateral lung opacities are noted concerning for multifocal pneumonia or chronic scarring. No pneumothorax or pleural effusion is noted. Bony thorax is unremarkable. IMPRESSION: Stable bilateral lung opacities as described above. Electronically Signed   By: Marijo Conception M.D.   On: 09/18/2020 12:59   CT CHEST WO CONTRAST  Result Date: 09/20/2020 CLINICAL DATA:  Respiratory failure.  History of Hodgkin's lymphoma. EXAM: CT CHEST WITHOUT CONTRAST TECHNIQUE: Multidetector CT imaging of the chest was performed following the standard protocol without IV contrast. COMPARISON:  July 21, 2020 FINDINGS: Cardiovascular: Heart is mildly enlarged. LEFT chest port tip terminates at the superior cavoatrial junction. No pericardial effusion. Minimal atherosclerotic calcifications. Enlarged pulmonary artery as can be seen in pulmonary arterial hypertension. Mediastinum/Nodes: Revisualization of extensive adenopathy. LEFT level 3 axillary lymph node measures 11 mm, previously 3 mm (series 2, image 42). LEFT supraclavicular lymph node measures 15 mm, previously 15 mm (series 2, image 24). RIGHT level 1 axillary lymph node measures 8 mm, previously 4 mm (series 2, image 63). RIGHT internal mammary lymph node measures 31 mm, previously 26 mm (series 2, image 77). RIGHT paratracheal conglomeration spanned 59 x 43 mm, previously 44 by 30 mm (series 2, image 45). There is a lunate  configuration of the trachea. Lungs/Pleura: Visualization of a peripheral rind of fluid adjacent to the RIGHT lung, similar in comparison to prior. Extensive bilateral ground-glass opacities with subpleural reticulation and suggestion of honeycombing. Peribronchovascular nodularity is similar in comparison to prior. Similar appearance of RIGHT middle lobe atelectasis. Upper Abdomen: No acute abnormality. Musculoskeletal: Known osseous metastatic disease is not well visualized. Limited evaluation of the ribs secondary to respiratory motion. No acute thoracic spine fracture. IMPRESSION: 1. Interval increase in bulky adenopathy within the mediastinum consistent with known lymphoma. Increased axillary lymph node size in comparison to prior. 2. Revisualization of sequela of advanced pulmonary fibrosis. Unchanged appearance of peribronchovascular nodularity. This could reflect lymphangitic spread of lymphoma versus underlying infection. 3. Unchanged small rind of RIGHT-sided pleural fluid. Underlying pleural nodularity from lymphoma remains in the differential. 4. 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 5. Enlarged pulmonary artery as can be seen in pulmonary arterial hypertension. Electronically Signed   By: Valentino Saxon MD   On: 09/20/2020 17:25   DG Chest Port 1 View  Result Date: 09/19/2020 CLINICAL DATA:  Shortness of breath. EXAM: PORTABLE CHEST 1 VIEW COMPARISON:  Chest x-ray dated September 18, 2020. FINDINGS: Unchanged tracheostomy tube and left chest wall port catheter. Stable cardiomegaly and mediastinal widening related to underlying lymphadenopathy. Unchanged diffuse patchy nodular opacities throughout both lungs superimposed on chronic pulmonary fibrosis. Unchanged small right pleural effusion. No pneumothorax. No acute osseous abnormality. IMPRESSION: 1. Unchanged multifocal pneumonia superimposed on chronic severe pulmonary fibrosis. 2. Unchanged  mediastinal lymphadenopathy. Electronically Signed   By: Titus Dubin M.D.   On: 09/19/2020 15:39   DG Swallowing Func-Speech Pathology  Result Date: 09/20/2020 Formatting of this result is different from the original. Objective Swallowing Evaluation: Type of Study: MBS-Modified Barium Swallow Study  Patient Details Name: OSIAH HARING MRN: [id number] Date of Birth: November 29, 1955 Today's Date: 09/20/2020 Time: SLP  Start Time (ACUTE ONLY): 0903 -SLP Stop Time (ACUTE ONLY): 6294 SLP Time Calculation (min) (ACUTE ONLY): 20 min Past Medical History: Past Medical History: Diagnosis Date  Abscess of muscle 08/10/2011  staph infection of right hip   Acute on chronic respiratory failure with hypoxia ([name])   Acute respiratory distress syndrome (ARDS) due to COVID-19 virus ([name])   Anxiety   Diabetes mellitus without complication ([name])   GERD (gastroesophageal reflux disease)   rare reflux - no meds for reflux - NO PROBLEM IN PAST SEVERAL YRS  HCAP (healthcare-associated pneumonia)   Hip dysplasia, congenital   no surgery as a child for hip dysplasia - has had bilateral hip replacements as an adult  Hodgkin lymphoma ([place])   Hypertension   Pancreatitis   Pneumothorax, acute   Postoperative anemia due to acute blood loss 09/07/2012  Septic arthritis of hip ([place]) 09/05/2012  PT'S TOTAL HIP JOINT REMOVED - ANTIBIOTIC SPACE PLACED AND PT HAS FINISHED IV ANTIBIOTICS ( PICC LINE REMOVED)  Sleep apnea   USES CPAP Past Surgical History: Past Surgical History: Procedure Laterality Date  COLONOSCOPY  04/26/2007  HERNIA REPAIR    inguinal hernia x3  IR GASTROSTOMY TUBE MOD SED  08/22/2019  IR IMAGING GUIDED PORT INSERTION  03/29/2019  JOINT REPLACEMENT  2002 & 2007  bilateral hip replacement  LYMPH NODE BIOPSY Right 03/20/2019  Procedure: DEEP RIGHT SUPRACLAVICULAR LYMPH NODE EXCISION;  Surgeon: Fanny Skates, MD;  Location: [place];  Service: General;  Laterality: Right;  MULTIPLE EXTRACTIONS WITH ALVEOLOPLASTY   07/28/2011  Procedure: MULTIPLE EXTRACION WITH ALVEOLOPLASTY;  Surgeon: Lenn Cal, DDS;  Location[facility];  Service: Oral Surgery;  Laterality: N/A;  Extraction of tooth #'s 2,3,4,5,6,11,12,13,15,19,22 with alveoloplasty.  shoulder repair - right for separation of shoulder    TEE WITHOUT CARDIOVERSION  07/29/2011  Procedure: TRANSESOPHAGEAL ECHOCARDIOGRAM (TEE);  Surgeon: Josue Hector, MD;  Location: [place];  Service: Cardiovascular;  Laterality: N/A;  TOTAL HIP REVISION Right 09/05/2012  Procedure: RIGHT HIP RESECTION ARTHROPLASTY WITH ANTIBIOTIC SPACERS;  Surgeon: [name], MD;  [facility];  Service: Orthopedics;  Laterality: Right;  TOTAL HIP REVISION Right 11/30/2012  Procedure: RIGHT TOTAL HIP ARTHROPLASTY REIMPLANTATION;  Surgeon: [name], MD;  [facility];  Service: Orthopedics;  Laterality: Right; HPI: Patient is a 65 year old Caucasian  male with medical history significant of chronic tracheostomy after prolonged hospitalization in 2021 related with pneumonia due to TMLYY-50 complicated by ARDS and pulmonary fibrosis +/-pneumonitis from brentuximab, s/p PEG, HTN, DM type II, Hodgkin's lymphoma on chemotherapy and GERD.  Patient was discharged from this hospital on 08/25/2020.  Patient was said to have done very well for the first 3 weeks.  Over the last 5 days, patient has been failing to thrive, with associated poor appetite and malaise.  Readmitted with unchanged multifocal PNA.  Subjective: pt alert, eager to go home Assessment / Plan / Recommendation CHL IP CLINICAL IMPRESSIONS 09/20/2020 Clinical Impression Patient presents with excellent oropharyngeal swallowing function. Oral phase timely and with full clearance of bolus. Pharyngeally, strength has improved from previous MBS 03/2020 in that patient has no pharyngeal residue post swallow. Intermittent trace penetration of thin liquids noted, primarily via straw, due to mildly decreased laryngeal closure noted,  remaining above the vocal cords and clearing with either subsequent dry swallow or cued throat clear. Tested all consistencies both with and without PMSV in place with no notable difference in swallowing function. Educated patient and spouse (via phone per  patient request) on results of testing and recommendations which include use of PMV if able (not necessary) to faciliate improved strength of cough/throat clear if needed. Both verbalized understanding. SLP will f/u briefly for tolerance and continued education as needed. Do not suspect that dysphagia is resulting in recurrent PNAs. Cannot however r/o contribution of PEG tube feedings on aspiration, particularly because patient is also taking pos by mouth in conjunction with tube feeds, and complains of feeling full often. Recommend dietary consult to ensure that patient is being fed via tube the correct amount and in the correct manner based on how much he consuming by mouth. SLP Visit Diagnosis Dysphagia, pharyngeal phase (R13.13) Attention and concentration deficit following -- Frontal lobe and executive function deficit following -- Impact on safety and function Mild aspiration risk   CHL IP TREATMENT RECOMMENDATION 09/20/2020 Treatment Recommendations Therapy as outlined in treatment plan below   Prognosis 08/23/2020 Prognosis for Safe Diet Advancement Good Barriers to Reach Goals -- Barriers/Prognosis Comment -- CHL IP DIET RECOMMENDATION 09/20/2020 SLP Diet Recommendations Regular solids;Thin liquid Liquid Administration via Cup;Straw Medication Administration Whole meds with liquid Compensations Slow rate;Small sips/bites Postural Changes Remain semi-upright after after feeds/meals (Comment);Seated upright at 90 degrees   CHL IP OTHER RECOMMENDATIONS 09/20/2020 Recommended Consults Other (Comment) Oral Care Recommendations Oral care BID Other Recommendations --   CHL IP FOLLOW UP RECOMMENDATIONS 09/20/2020 Follow up Recommendations None   CHL IP FREQUENCY AND  DURATION 09/20/2020 Speech Therapy Frequency (ACUTE ONLY) min 1 x/week Treatment Duration 1 week      CHL IP ORAL PHASE 09/20/2020 Oral Phase WFL Oral - Pudding Teaspoon -- Oral - Pudding Cup -- Oral - Honey Teaspoon -- Oral - Honey Cup -- Oral - Nectar Teaspoon -- Oral - Nectar Cup -- Oral - Nectar Straw -- Oral - Thin Teaspoon -- Oral - Thin Cup -- Oral - Thin Straw -- Oral - Puree -- Oral - Mech Soft -- Oral - Regular -- Oral - Multi-Consistency -- Oral - Pill -- Oral Phase - Comment --  CHL IP PHARYNGEAL PHASE 09/20/2020 Pharyngeal Phase Impaired Pharyngeal- Pudding Teaspoon -- Pharyngeal -- Pharyngeal- Pudding Cup -- Pharyngeal -- Pharyngeal- Honey Teaspoon -- Pharyngeal -- Pharyngeal- Honey Cup -- Pharyngeal -- Pharyngeal- Nectar Teaspoon -- Pharyngeal -- Pharyngeal- Nectar Cup -- Pharyngeal -- Pharyngeal- Nectar Straw -- Pharyngeal -- Pharyngeal- Thin Teaspoon NT Pharyngeal -- Pharyngeal- Thin Cup WFL Pharyngeal -- Pharyngeal- Thin Straw Reduced laryngeal elevation;Penetration/Aspiration during swallow Pharyngeal Material enters airway, remains ABOVE vocal cords and not ejected out Pharyngeal- Puree WFL Pharyngeal -- Pharyngeal- Mechanical Soft WFL Pharyngeal -- Pharyngeal- Regular -- Pharyngeal -- Pharyngeal- Multi-consistency -- Pharyngeal -- Pharyngeal- Pill WFL Pharyngeal -- Pharyngeal Comment --  CHL IP CERVICAL ESOPHAGEAL PHASE 09/20/2020 Cervical Esophageal Phase WFL Pudding Teaspoon -- Pudding Cup -- Honey Teaspoon -- Honey Cup -- Nectar Teaspoon -- Nectar Cup -- Nectar Straw -- Thin Teaspoon -- Thin Cup -- Thin Straw -- Puree -- Mechanical Soft -- Regular -- Multi-consistency -- Pill -- Cervical Esophageal Comment -- McCoy Leah Meryl 09/20/2020, 9:32 AM              ECHOCARDIOGRAM COMPLETE  Result Date: 09/21/2020    ECHOCARDIOGRAM REPORT   Patient Name:   BYRNE CAPEK [facility] Date of Exam: 09/21/2020 Medical Rec #:  [id number]         Height:       65.0 in Accession #:    [id number]        Weight:  139.6 lb Date of Birth:  05-15-1955          BSA:          1.698 m Patient Age:    62 years          BP:           103/66 mmHg Patient Gender: M                 HR:           95 bpm. Exam Location:  Inpatient Procedure: 2D Echo, 3D Echo, Cardiac Doppler and Color Doppler Indications:    Bacteremia  History:        Patient has prior history of Echocardiogram examinations, most                 recent 07/09/2019. Abnormal ECG, Arrythmias:Tachycardia,                 Signs/Symptoms:Bacteremia, Shortness of Breath and Dyspnea; Risk                 Factors:Sleep Apnea, Hypertension and Diabetes. Cancer. Right                 pneumothorax. Subcutaneous air. Hypoxia.  Sonographer:    Roseanna Rainbow RDCS Referring Phys: Central Comments: Technically difficult study due to poor echo windows, suboptimal parasternal window, suboptimal apical window and suboptimal subcostal window. Patient has trach collar. Off aix medial images in apical region. Feeding tube in subcostal region. IMPRESSIONS  1. Left ventricular ejection fraction, by estimation, is 60 to 65%. The left ventricle has normal function. The left ventricle has no regional wall motion abnormalities. There is mild left ventricular hypertrophy. Left ventricular diastolic parameters were normal.  2. Right ventricular systolic function is normal. The right ventricular size is moderately enlarged. There is normal pulmonary artery systolic pressure.  3. The mitral valve is normal in structure. No evidence of mitral valve regurgitation.  4. The aortic valve was not well visualized. Aortic valve regurgitation is not visualized. No aortic stenosis is present.  5. The inferior vena cava is normal in size with greater than 50% respiratory variability, suggesting right atrial pressure of 3 mmHg. Conclusion(s)/Recommendation(s): Techincally difficult study, but no vegetaion seen. If high clinical supicion for endocarditis, consider TEE. FINDINGS  Left Ventricle:  Left ventricular ejection fraction, by estimation, is 60 to 65%. The left ventricle has normal function. The left ventricle has no regional wall motion abnormalities. The left ventricular internal cavity size was small. There is mild left ventricular hypertrophy. Left ventricular diastolic parameters were normal. Right Ventricle: The right ventricular size is moderately enlarged. Right vetricular wall thickness was not well visualized. Right ventricular systolic function is normal. There is normal pulmonary artery systolic pressure. The tricuspid regurgitant velocity is 2.78 m/s, and with an assumed right atrial pressure of 3 mmHg, the estimated right ventricular systolic pressure is 22.0 mmHg. Left Atrium: Left atrial size was normal in size. Right Atrium: Right atrial size was normal in size. Pericardium: There is no evidence of pericardial effusion. Mitral Valve: The mitral valve is normal in structure. No evidence of mitral valve regurgitation. Tricuspid Valve: The tricuspid valve is normal in structure. Tricuspid valve regurgitation is trivial. Aortic Valve: The aortic valve was not well visualized. Aortic valve regurgitation is not visualized. No aortic stenosis is present. Pulmonic Valve: The pulmonic valve was not well visualized. Pulmonic valve regurgitation is not visualized. Aorta: The aortic root is  normal in size and structure. Venous: The inferior vena cava is normal in size with greater than 50% respiratory variability, suggesting right atrial pressure of 3 mmHg. IAS/Shunts: The interatrial septum was not well visualized.  LEFT VENTRICLE PLAX 2D LVIDd:         3.60 cm      Diastology LVIDs:         2.30 cm      LV e' medial:    11.20 cm/s LV PW:         1.30 cm      LV E/e' medial:  8.5 LV IVS:        0.90 cm      LV e' lateral:   12.00 cm/s LVOT diam:     2.20 cm      LV E/e' lateral: 7.9 LV SV:         57 LV SV Index:   34 LVOT Area:     3.80 cm  LV Volumes (MOD) LV vol d, MOD A2C: 66.9 ml LV vol  d, MOD A4C: 101.0 ml LV vol s, MOD A2C: 22.7 ml LV vol s, MOD A4C: 24.2 ml LV SV MOD A2C:     44.2 ml LV SV MOD A4C:     101.0 ml LV SV MOD BP:      78.9 ml RIGHT VENTRICLE             IVC RV S prime:     18.20 cm/s  IVC diam: 1.80 cm TAPSE (M-mode): 2.0 cm LEFT ATRIUM           Index       RIGHT ATRIUM           Index LA diam:      3.40 cm 2.00 cm/m  RA Area:     15.10 cm LA Vol (A2C): 13.3 ml 7.83 ml/m  RA Volume:   34.90 ml  20.56 ml/m LA Vol (A4C): 28.1 ml 16.55 ml/m  AORTIC VALVE LVOT Vmax:   126.00 cm/s LVOT Vmean:  71.400 cm/s LVOT VTI:    0.150 m  AORTA Ao Root diam: 3.40 cm Ao Asc diam:  3.70 cm MITRAL VALVE               TRICUSPID VALVE MV Area (PHT): 5.19 cm    TR Peak grad:   30.9 mmHg MV Decel Time: 146 msec    TR Vmax:        278.00 cm/s MV E velocity: 95.14 cm/s MV A velocity: 84.80 cm/s  SHUNTS MV E/A ratio:  1.12        Systemic VTI:  0.15 m                            Systemic Diam: 2.20 cm Oswaldo Milian MD Electronically signed by Oswaldo Milian MD Signature Date/Time: 09/21/2020/6:08:46 PM    Final    US Abdomen Limited RUQ (LIVER/GB)  Result Date: 09/19/2020 CLINICAL DATA:  Transaminitis. EXAM: ULTRASOUND ABDOMEN LIMITED RIGHT UPPER QUADRANT COMPARISON:  None. FINDINGS: Gallbladder: No gallbladder wall thickening. Small amount of biliary sludge. No sonographic Murphy sign noted by sonographer. Common bile duct: Diameter: 3 mm, normal Liver: No focal lesion identified. Within normal limits in parenchymal echogenicity. Portal vein is patent on color Doppler imaging with normal direction of blood flow towards the liver. Other: None. IMPRESSION: Biliary sludge without evidence of acute cholecystitis. Electronically Signed   By: Colletta Maryland  Peacock MD   On: 09/19/2020 16:18    Labs:  CBC: Recent Labs    09/18/20 1212 09/19/20 1511 09/20/20 1015 09/22/20 0536  WBC 18.6 Repeated and verified X2.* 19.0* 24.9* 17.2*  HGB 10.8* 10.2* 10.2* 9.1*  HCT 33.9* 33.1* 33.3* 29.8*   PLT 384.0 313 314 237    COAGS: Recent Labs    03/23/20 1042 05/26/20 0535 08/13/20 1248  INR 1.2 1.3* 1.2  APTT 35 37*  --     BMP: Recent Labs    10/16/19 0940 10/19/19 0537 10/23/19 0636 10/27/19 0606 03/23/20 1042 09/08/20 1140 09/18/20 1212 09/19/20 1511 09/20/20 0708 09/22/20 0536  NA 144 145 142 138   < > 127* 135 131* 135 138  K 4.0 4.4 4.4 4.3   < > 4.5 5.9 No hemolysis seen* 4.4 4.7 4.1  CL 95* 99 95* 94*   < > 82* 82* 81* 87* 92*  CO2 41* 40* 37* 35*   < > 40* 49* 40* 43* 43*  GLUCOSE 143* 177* 133* 156*   < > 129* 391* 223* 254* 326*  BUN 14 34* 15 20   < > 21 24* 23 25* 25*  CALCIUM 9.7 9.7 9.7 9.6   < > 9.5 9.9 9.2 9.6 9.3  CREATININE 0.46* 0.49* 0.51* 0.44*   < > 0.39* 0.53 0.43* 0.55* 0.51*  GFRNONAA >60 >60 >60 >60   < > >60  --  >60 >60 >60  GFRAA >60 >60 >60 >60  --   --   --   --   --   --    < > = values in this interval not displayed.    LIVER FUNCTION TESTS: Recent Labs    09/08/20 1140 09/18/20 1212 09/19/20 1511 09/20/20 0708  BILITOT 1.2 0.6 0.6 0.8  AST 77* 17 15 15   ALT 230* 34 28 30  ALKPHOS 219* 113 83 82  PROT 6.2* 6.5 6.3* 6.7  ALBUMIN 3.2* 3.0* 2.3* 2.2*    TUMOR MARKERS: No results for input(s): AFPTM, CEA, CA199, CHROMGRNA in the last 8760 hours.  Assessment and Plan: 65 y.o. 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  Patient is currently admitted due to concern for recurrent pneumonia, was found to have bacteremia with Staphylococcus epidermidis.  Removal of a Port-A-Cath was recommended by ID.  IR was requested for removal of a Port-A-Cath. Case was reviewed and approved by Dr. Laurence Ferrari.    Tube feeds stopped at 10 AM today. VSS, chronic tachypnea  Risks and benefits of image guided port-a-catheter removal was discussed with the patient including, but not limited to bleeding, infection, and damage to blood vessel.  All of the patient's questions were answered, patient is agreeable to proceed. Consent signed and in chart.    Thank you for this interesting consult.  I greatly enjoyed meeting DINARI STGERMAINE and look forward to participating in their care.  A copy of this report was sent to the requesting provider on this date.  Electronically Signed: Tera Mater, PA-C 09/22/2020, 11:01 AM   I spent a total of   25 min in face to face in clinical consultation, greater than 50% of which  was counseling/coordinating care for Port-A-Cath removal.

## 2020-09-22 NOTE — Progress Notes (Addendum)
HEMATOLOGY-ONCOLOGY PROGRESS NOTE  SUBJECTIVE: The patient is resting bed quietly this morning.  He does not open his eyes while I am in the room but he nods his head yes/no when asked questions.  He denies pain currently.  Shortness of breath is stable.  The patient was seen by infectious disease and they have recommended Port-A-Cath removal and evaluation for endocarditis.  Oncology History  Hodgkin's lymphoma ([place])  02/25/2019 Initial Diagnosis   Hodgkin lymphoma ([place])   02/28/2019 Imaging   CT neck: IMPRESSION: Right paratracheal enlarged lymph node and right supraclavicular enlarged lymph nodes compatible with neoplasm. No pharyngeal mass in the neck. No other adenopathy in the neck. Tissue sampling is recommended.   02/28/2019 Imaging   CT CAP: IMPRESSION: 1. Bulky right supraclavicular and mediastinal adenopathy most compatible with metastatic disease. Additionally there is porta hepatic and retroperitoneal adenopathy within the abdomen concerning for metastatic disease. 2. Mild fibrotic changes involving the lungs bilaterally. 3. See dedicated neck CT report.   03/08/2019 Pathology Results   SURGICAL PATHOLOGY  CASE: [id number]  [name]  Surgical Pathology Report      Clinical History: No known primary, now with right supraclavicular  lymphadenopathy, post US guided Bx. (cm)      FINAL MICROSCOPIC DIAGNOSIS:   A. LYMPH NODE, RIGHT SUPRACLAVICULAR, BIOPSY:  - Atypical lymphoid proliferation  - See comment   COMMENT:   The sections show needle core biopsy fragments of lymph nodal tissue  displaying predominance of small round to slightly irregular lymphocytes  admixed with a minor population of scattered large atypical mononuclear  and multilobated lymphoid appearing cells with variably prominent  nucleoli.  This is admixed with scattering of eosinophils in some areas.  Flow cytometric analysis was attempted but there was insufficient   material present in the sample ([place]).  Immunohistochemical  stains were performed including CD10, CD15, CD20, CD30, CD5, LCA, cyclin  D1, PAX 5, CD3 and EBV with appropriate controls.  LCA is diffusely  positive.  The small lymphoid cells show a mixture of T and B cells with  predominance of T cells.  No significant EBV, CD10 or cyclin D1  positivity is identified.  The larger atypical lymphoid cells appear to  be positive for CD30 and some for CD15 and PAX 5.  The overall findings  are very limited but atypical and worrisome for a lymphoproliferative  process particularly Hodgkin lymphoma.  Excisional biopsy is recommended  including fresh tissue for lymphoma work-up in order to further evaluate  this process    03/20/2019 Pathology Results   FINAL MICROSCOPIC DIAGNOSIS:   A. LYMPH NODE, DEEP RIGHT CERVICAL POSTERIOR TRIANGLE, EXCISION:  -Classical Hodgkin lymphoma  -See comment   COMMENT:   Sections of the lymph node show effacement of the architecture by a  vaguely nodular lymphoproliferative process characterized by a  polymorphous cellular proliferation of small lymphocytes, eosinophils,  plasma cells in addition to variable numbers of large atypical  mononuclear and multi-lobated lymphoid appearing cells with variably  prominent nucleoli characteristic of Reed-Sternberg cells and variants  including lacunar cells.  Areas of early fibrosis surrounding some of  the nodules are also present.  Flow cytometric analysis was performed  ([id number]) and shows predominance of T lymphocytes with nonspecific  changes in addition to a minor polyclonal B-cell population with no  abnormal phenotype. Immunohistochemical stains were performed including  CD20, CD3, PAX 5, CD15, CD30, LCA and in situ hybridization for EBV with  appropriate controls.  The large  atypical lymphoid appearing cells are  positive for CD15, CD30 and weakly for PAX 5 and negative for CD3, CD20,  LCA and EBV.   The small lymphocytes in the background show a mixture of  T and B-cells with predominance of T-cells. The morphologic and  immunophenotypic features are consistent with classical Hodgkin lymphoma  which is best subclassified as nodular sclerosis type.    04/03/2019 Imaging   PET: IMPRESSION: 1. Hypermetabolic lymphadenopathy involving the neck, chest and abdomen consistent with known Hodgkin's lymphoma. [place] 5. 2. No findings for osseous metastatic disease.   04/03/2019 Cancer Staging   Staging form: Hodgkin and Non-Hodgkin Lymphoma, AJCC 8th Edition - Clinical stage from 04/03/2019: Stage III (Hodgkin lymphoma) - Signed by Tish Men, MD on 04/08/2019    04/08/2019 - 06/17/2019 Chemotherapy          07/10/2020 -  Chemotherapy    Patient is on Treatment Plan: HODGKINS LYMPHOMA BRENTUXIMAB Q21D       Hodgkin's lymphoma ([place])  08/29/2019 Initial Diagnosis   Hodgkin's lymphoma ([place])    07/10/2020 -  Chemotherapy    Patient is on Treatment Plan: HODGKINS LYMPHOMA BRENTUXIMAB Q21D          REVIEW OF SYSTEMS:   Constitutional: Denies fevers, chills or abnormal weight loss Eyes: Denies blurriness of vision Ears, nose, mouth, throat, and face: Denies mucositis or sore throat Respiratory: He has ongoing shortness of breath Cardiovascular: Denies palpitation, chest discomfort Gastrointestinal:  Denies nausea, heartburn or change in bowel habits Skin: Denies abnormal skin rashes Lymphatics: Denies new lymphadenopathy or easy bruising Neurological:Denies numbness, tingling or new weaknesses Behavioral/Psych: Mood is stable, no new changes  Extremities: No lower extremity edema All other systems were reviewed with the patient and are negative.  I have reviewed the past medical history, past surgical history, social history and family history with the patient and they are unchanged from previous note.   PHYSICAL EXAMINATION: ECOG PERFORMANCE STATUS: 2 - Symptomatic, <50%  confined to bed  Vitals:   09/22/20 0621 09/22/20 0802  BP: 102/66   Pulse: 98   Resp: (!) 25   Temp: 97.8 F (36.6 C)   SpO2: 97% 95%   Filed Weights   09/19/20 1930  Weight: 63.3 kg    Intake/Output from previous day: 06/13 0701 - 06/14 0700 In: 421.2 [NG/GT:171.2; IV Piggyback:250] Out: 1500 [Urine:1500]  GENERAL: Chronically ill-appearing male, no distress SKIN: skin color, texture, turgor are normal, no rashes or significant lesions EYES: normal, Conjunctiva are pink and non-injected, sclera clear OROPHARYNX:no exudate, no erythema and lips, buccal mucosa, and tongue normal  NECK: Tracheostomy midline LUNGS: Diminished breath sounds bilaterally HEART: regular rate & rhythm and no murmurs and no lower extremity edema ABDOMEN:abdomen soft, non-tender and normal bowel sounds, PEG tube in place NEURO: Does not open eyes to interact with me but does answer questions yes/no  LABORATORY DATA:  I have reviewed the data as listed CMP Latest Ref Rng & Units 09/22/2020 09/20/2020 09/19/2020  Glucose 70 - 99 mg/dL 326(H) 254(H) 223(H)  BUN 8 - 23 mg/dL 25(H) 25(H) 23  Creatinine 0.61 - 1.24 mg/dL 0.51(L) 0.55(L) 0.43(L)  Sodium 135 - 145 mmol/L 138 135 131(L)  Potassium 3.5 - 5.1 mmol/L 4.1 4.7 4.4  Chloride 98 - 111 mmol/L 92(L) 87(L) 81(L)  CO2 22 - 32 mmol/L 43(H) 43(H) 40(H)  Calcium 8.9 - 10.3 mg/dL 9.3 9.6 9.2  Total Protein 6.5 - 8.1 g/dL - 6.7 6.3(L)  Total Bilirubin 0.3 - 1.2  mg/dL - 0.8 0.6  Alkaline Phos 38 - 126 U/L - 82 83  AST 15 - 41 U/L - 15 15  ALT 0 - 44 U/L - 30 28    Lab Results  Component Value Date   WBC 17.2 (H) 09/22/2020   HGB 9.1 (L) 09/22/2020   HCT 29.8 (L) 09/22/2020   MCV 97.1 09/22/2020   PLT 237 09/22/2020   NEUTROABS 21.8 (H) 09/20/2020    DG Chest 2 View  Result Date: 09/18/2020 CLINICAL DATA:  Acute on chronic respiratory failure. EXAM: CHEST - 2 VIEW COMPARISON:  Aug 15, 2020. FINDINGS: Stable cardiomegaly. Tracheostomy tube is in  good position. Left-sided Port-A-Cath is unchanged. Stable bilateral lung opacities are noted concerning for multifocal pneumonia or chronic scarring. No pneumothorax or pleural effusion is noted. Bony thorax is unremarkable. IMPRESSION: Stable bilateral lung opacities as described above. Electronically Signed   By: Marijo Conception M.D.   On: 09/18/2020 12:59   CT CHEST WO CONTRAST  Result Date: 09/20/2020 CLINICAL DATA:  Respiratory failure.  History of Hodgkin's lymphoma. EXAM: CT CHEST WITHOUT CONTRAST TECHNIQUE: Multidetector CT imaging of the chest was performed following the standard protocol without IV contrast. COMPARISON:  July 21, 2020 FINDINGS: Cardiovascular: Heart is mildly enlarged. LEFT chest port tip terminates at the superior cavoatrial junction. No pericardial effusion. Minimal atherosclerotic calcifications. Enlarged pulmonary artery as can be seen in pulmonary arterial hypertension. Mediastinum/Nodes: Revisualization of extensive adenopathy. LEFT level 3 axillary lymph node measures 11 mm, previously 3 mm (series 2, image 42). LEFT supraclavicular lymph node measures 15 mm, previously 15 mm (series 2, image 24). RIGHT level 1 axillary lymph node measures 8 mm, previously 4 mm (series 2, image 63). RIGHT internal mammary lymph node measures 31 mm, previously 26 mm (series 2, image 77). RIGHT paratracheal conglomeration spanned 59 x 43 mm, previously 44 by 30 mm (series 2, image 45). There is a lunate configuration of the trachea. Lungs/Pleura: Visualization of a peripheral rind of fluid adjacent to the RIGHT lung, similar in comparison to prior. Extensive bilateral ground-glass opacities with subpleural reticulation and suggestion of honeycombing. Peribronchovascular nodularity is similar in comparison to prior. Similar appearance of RIGHT middle lobe atelectasis. Upper Abdomen: No acute abnormality. Musculoskeletal: Known osseous metastatic disease is not well visualized. Limited evaluation  of the ribs secondary to respiratory motion. No acute thoracic spine fracture. IMPRESSION: 1. Interval increase in bulky adenopathy within the mediastinum consistent with known lymphoma. Increased axillary lymph node size in comparison to prior. 2. Revisualization of sequela of advanced pulmonary fibrosis. Unchanged appearance of peribronchovascular nodularity. This could reflect lymphangitic spread of lymphoma versus underlying infection. 3. Unchanged small rind of RIGHT-sided pleural fluid. Underlying pleural nodularity from lymphoma remains in the differential. 4. 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 5. Enlarged pulmonary artery as can be seen in pulmonary arterial hypertension. Electronically Signed   By: Valentino Saxon MD   On: 09/20/2020 17:25   DG Chest Port 1 View  Result Date: 09/19/2020 CLINICAL DATA:  Shortness of breath. EXAM: PORTABLE CHEST 1 VIEW COMPARISON:  Chest x-ray dated September 18, 2020. FINDINGS: Unchanged tracheostomy tube and left chest wall port catheter. Stable cardiomegaly and mediastinal widening related to underlying lymphadenopathy. Unchanged diffuse patchy nodular opacities throughout both lungs superimposed on chronic pulmonary fibrosis. Unchanged small right pleural effusion. No pneumothorax. No acute osseous abnormality. IMPRESSION: 1. Unchanged multifocal pneumonia superimposed on chronic severe pulmonary fibrosis. 2. Unchanged mediastinal lymphadenopathy. Electronically Signed  By: Titus Dubin M.D.   On: 09/19/2020 15:39   DG Swallowing Func-Speech Pathology  Result Date: 09/20/2020 Formatting of this result is different from the original. Objective Swallowing Evaluation: Type of Study: MBS-Modified Barium Swallow Study  Patient Details Name: Steve [name] MRN: [id number] Date of Birth: [birth year] Today's Date: 09/20/2020 Time: SLP Start Time (ACUTE ONLY): 0903 -SLP Stop Time (ACUTE ONLY): 4818 SLP Time Calculation  (min) (ACUTE ONLY): 20 min Past Medical History: Past Medical History: Diagnosis Date  Abscess of muscle 08/10/2011  staph infection of right hip   Acute on chronic respiratory failure with hypoxia ([name])   Acute respiratory distress syndrome (ARDS) due to COVID-19 virus ([place])   Anxiety   Diabetes mellitus without complication ([name])   GERD (gastroesophageal reflux disease)   rare reflux - no meds for reflux - NO PROBLEM IN PAST SEVERAL YRS  HCAP (healthcare-associated pneumonia)   Hip dysplasia, congenital   no surgery as a child for hip dysplasia - has had bilateral hip replacements as an adult  Hodgkin lymphoma ([place])   Hypertension   Pancreatitis   Pneumothorax, acute   Postoperative anemia due to acute blood loss 09/07/2012  Septic arthritis of hip ([place]) 09/05/2012  PT'S TOTAL HIP JOINT REMOVED - ANTIBIOTIC SPACE PLACED AND PT HAS FINISHED IV ANTIBIOTICS ( PICC LINE REMOVED)  Sleep apnea   USES CPAP Past Surgical History: Past Surgical History: Procedure Laterality Date  COLONOSCOPY  04/26/2007  HERNIA REPAIR    inguinal hernia x3  IR GASTROSTOMY TUBE MOD SED  08/22/2019  IR IMAGING GUIDED PORT INSERTION  03/29/2019  JOINT REPLACEMENT  2002 & 2007  bilateral hip replacement  LYMPH NODE BIOPSY Right 03/20/2019  Procedure: DEEP RIGHT SUPRACLAVICULAR LYMPH NODE EXCISION;  Surgeon: Fanny Skates, MD;  Location: [facility];  Service: General;  Laterality: Right;  MULTIPLE EXTRACTIONS WITH ALVEOLOPLASTY  07/28/2011  Procedure: MULTIPLE EXTRACION WITH ALVEOLOPLASTY;  Surgeon: Lenn Cal, DDS;  Location: [facility];  Service: Oral Surgery;  Laterality: N/A;  Extraction of tooth #'s 2,3,4,5,6,11,12,13,15,19,22 with alveoloplasty.  shoulder repair - right for separation of shoulder    TEE WITHOUT CARDIOVERSION  07/29/2011  Procedure: TRANSESOPHAGEAL ECHOCARDIOGRAM (TEE);  Surgeon: Josue Hector, MD;  Location: [facility];  Service: Cardiovascular;  Laterality: N/A;  TOTAL HIP REVISION Right 09/05/2012   Procedure: RIGHT HIP RESECTION ARTHROPLASTY WITH ANTIBIOTIC SPACERS;  Surgeon: [name], MD;  Location: [facility];  Service: Orthopedics;  Laterality: Right;  TOTAL HIP REVISION Right 11/30/2012  Procedure: RIGHT TOTAL HIP ARTHROPLASTY REIMPLANTATION;  Surgeon: [name], MD;  Location: [facility];  Service: Orthopedics;  Laterality: Right; HPI: Patient is a 65 year old Caucasian  male with medical history significant of chronic tracheostomy after prolonged hospitalization in 2021 related with pneumonia due to [id number] complicated by ARDS and pulmonary fibrosis +/-pneumonitis from brentuximab, s/p PEG, HTN, DM type II, Hodgkin's lymphoma on chemotherapy and GERD.  Patient was discharged from this hospital on 08/25/2020.  Patient was said to have done very well for the first 3 weeks.  Over the last 5 days, patient has been failing to thrive, with associated poor appetite and malaise.  Readmitted with unchanged multifocal PNA.  Subjective: pt alert, eager to go home Assessment / Plan / Recommendation CHL IP CLINICAL IMPRESSIONS 09/20/2020 Clinical Impression Patient presents with excellent oropharyngeal swallowing function. Oral phase timely and with full clearance of bolus. Pharyngeally, strength has improved from previous MBS 03/2020 in that patient has no pharyngeal residue post swallow.  Intermittent trace penetration of thin liquids noted, primarily via straw, due to mildly decreased laryngeal closure noted, remaining above the vocal cords and clearing with either subsequent dry swallow or cued throat clear. Tested all consistencies both with and without PMSV in place with no notable difference in swallowing function. Educated patient and spouse (via phone per patient request) on results of testing and recommendations which include use of PMV if able (not necessary) to faciliate improved strength of cough/throat clear if needed. Both verbalized understanding. SLP will f/u briefly for tolerance and continued  education as needed. Do not suspect that dysphagia is resulting in recurrent PNAs. Cannot however r/o contribution of PEG tube feedings on aspiration, particularly because patient is also taking pos by mouth in conjunction with tube feeds, and complains of feeling full often. Recommend dietary consult to ensure that patient is being fed via tube the correct amount and in the correct manner based on how much he consuming by mouth. SLP Visit Diagnosis Dysphagia, pharyngeal phase (R13.13) Attention and concentration deficit following -- Frontal lobe and executive function deficit following -- Impact on safety and function Mild aspiration risk   CHL IP TREATMENT RECOMMENDATION 09/20/2020 Treatment Recommendations Therapy as outlined in treatment plan below   Prognosis 08/23/2020 Prognosis for Safe Diet Advancement Good Barriers to Reach Goals -- Barriers/Prognosis Comment -- CHL IP DIET RECOMMENDATION 09/20/2020 SLP Diet Recommendations Regular solids;Thin liquid Liquid Administration via Cup;Straw Medication Administration Whole meds with liquid Compensations Slow rate;Small sips/bites Postural Changes Remain semi-upright after after feeds/meals (Comment);Seated upright at 90 degrees   CHL IP OTHER RECOMMENDATIONS 09/20/2020 Recommended Consults Other (Comment) Oral Care Recommendations Oral care BID Other Recommendations --   CHL IP FOLLOW UP RECOMMENDATIONS 09/20/2020 Follow up Recommendations None   CHL IP FREQUENCY AND DURATION 09/20/2020 Speech Therapy Frequency (ACUTE ONLY) min 1 x/week Treatment Duration 1 week      CHL IP ORAL PHASE 09/20/2020 Oral Phase WFL Oral - Pudding Teaspoon -- Oral - Pudding Cup -- Oral - Honey Teaspoon -- Oral - Honey Cup -- Oral - Nectar Teaspoon -- Oral - Nectar Cup -- Oral - Nectar Straw -- Oral - Thin Teaspoon -- Oral - Thin Cup -- Oral - Thin Straw -- Oral - Puree -- Oral - Mech Soft -- Oral - Regular -- Oral - Multi-Consistency -- Oral - Pill -- Oral Phase - Comment --  CHL IP  PHARYNGEAL PHASE 09/20/2020 Pharyngeal Phase Impaired Pharyngeal- Pudding Teaspoon -- Pharyngeal -- Pharyngeal- Pudding Cup -- Pharyngeal -- Pharyngeal- Honey Teaspoon -- Pharyngeal -- Pharyngeal- Honey Cup -- Pharyngeal -- Pharyngeal- Nectar Teaspoon -- Pharyngeal -- Pharyngeal- Nectar Cup -- Pharyngeal -- Pharyngeal- Nectar Straw -- Pharyngeal -- Pharyngeal- Thin Teaspoon NT Pharyngeal -- Pharyngeal- Thin Cup WFL Pharyngeal -- Pharyngeal- Thin Straw Reduced laryngeal elevation;Penetration/Aspiration during swallow Pharyngeal Material enters airway, remains ABOVE vocal cords and not ejected out Pharyngeal- Puree WFL Pharyngeal -- Pharyngeal- Mechanical Soft WFL Pharyngeal -- Pharyngeal- Regular -- Pharyngeal -- Pharyngeal- Multi-consistency -- Pharyngeal -- Pharyngeal- Pill WFL Pharyngeal -- Pharyngeal Comment --  CHL IP CERVICAL ESOPHAGEAL PHASE 09/20/2020 Cervical Esophageal Phase WFL Pudding Teaspoon -- Pudding Cup -- Honey Teaspoon -- Honey Cup -- Nectar Teaspoon -- Nectar Cup -- Nectar Straw -- Thin Teaspoon -- Thin Cup -- Thin Straw -- Puree -- Mechanical Soft -- Regular -- Multi-consistency -- Pill -- Cervical Esophageal Comment -- Steve [name] 09/20/2020, 9:32 AM              ECHOCARDIOGRAM COMPLETE  Result Date:  09/21/2020    ECHOCARDIOGRAM REPORT   Patient Name:   Steve [name] [facility] Date of Exam: 09/21/2020 Medical Rec #:  [id number]         Height:       65.0 in Accession #:    [id number]        Weight:       139.6 lb Date of Birth:  05-17-1955          BSA:          1.698 m Patient Age:    65 years          BP:           103/66 mmHg Patient Gender: M                 HR:           95 bpm. Exam Location:  Inpatient Procedure: 2D Echo, 3D Echo, Cardiac Doppler and Color Doppler Indications:    Bacteremia  History:        Patient has prior history of Echocardiogram examinations, most                 recent 07/09/2019. Abnormal ECG, Arrythmias:Tachycardia,                 Signs/Symptoms:Bacteremia, Shortness  of Breath and Dyspnea; Risk                 Factors:Sleep Apnea, Hypertension and Diabetes. Cancer. Right                 pneumothorax. Subcutaneous air. Hypoxia.  Sonographer:    Roseanna Rainbow RDCS Referring Phys: [place] Comments: Technically difficult study due to poor echo windows, suboptimal parasternal window, suboptimal apical window and suboptimal subcostal window. Patient has trach collar. Off aix medial images in apical region. Feeding tube in subcostal region. IMPRESSIONS  1. Left ventricular ejection fraction, by estimation, is 60 to 65%. The left ventricle has normal function. The left ventricle has no regional wall motion abnormalities. There is mild left ventricular hypertrophy. Left ventricular diastolic parameters were normal.  2. Right ventricular systolic function is normal. The right ventricular size is moderately enlarged. There is normal pulmonary artery systolic pressure.  3. The mitral valve is normal in structure. No evidence of mitral valve regurgitation.  4. The aortic valve was not well visualized. Aortic valve regurgitation is not visualized. No aortic stenosis is present.  5. The inferior vena cava is normal in size with greater than 50% respiratory variability, suggesting right atrial pressure of 3 mmHg. Conclusion(s)/Recommendation(s): Techincally difficult study, but no vegetaion seen. If high clinical supicion for endocarditis, consider TEE. FINDINGS  Left Ventricle: Left ventricular ejection fraction, by estimation, is 60 to 65%. The left ventricle has normal function. The left ventricle has no regional wall motion abnormalities. The left ventricular internal cavity size was small. There is mild left ventricular hypertrophy. Left ventricular diastolic parameters were normal. Right Ventricle: The right ventricular size is moderately enlarged. Right vetricular wall thickness was not well visualized. Right ventricular systolic function is normal. There is normal  pulmonary artery systolic pressure. The tricuspid regurgitant velocity is 2.78 m/s, and with an assumed right atrial pressure of 3 mmHg, the estimated right ventricular systolic pressure is 98.3 mmHg. Left Atrium: Left atrial size was normal in size. Right Atrium: Right atrial size was normal in size. Pericardium: There is no evidence of pericardial effusion. Mitral Valve: The mitral valve is normal  in structure. No evidence of mitral valve regurgitation. Tricuspid Valve: The tricuspid valve is normal in structure. Tricuspid valve regurgitation is trivial. Aortic Valve: The aortic valve was not well visualized. Aortic valve regurgitation is not visualized. No aortic stenosis is present. Pulmonic Valve: The pulmonic valve was not well visualized. Pulmonic valve regurgitation is not visualized. Aorta: The aortic root is normal in size and structure. Venous: The inferior vena cava is normal in size with greater than 50% respiratory variability, suggesting right atrial pressure of 3 mmHg. IAS/Shunts: The interatrial septum was not well visualized.  LEFT VENTRICLE PLAX 2D LVIDd:         3.60 cm      Diastology LVIDs:         2.30 cm      LV e' medial:    11.20 cm/s LV PW:         1.30 cm      LV E/e' medial:  8.5 LV IVS:        0.90 cm      LV e' lateral:   12.00 cm/s LVOT diam:     2.20 cm      LV E/e' lateral: 7.9 LV SV:         57 LV SV Index:   34 LVOT Area:     3.80 cm  LV Volumes (MOD) LV vol d, MOD A2C: 66.9 ml LV vol d, MOD A4C: 101.0 ml LV vol s, MOD A2C: 22.7 ml LV vol s, MOD A4C: 24.2 ml LV SV MOD A2C:     44.2 ml LV SV MOD A4C:     101.0 ml LV SV MOD BP:      78.9 ml RIGHT VENTRICLE             IVC RV S prime:     18.20 cm/s  IVC diam: 1.80 cm TAPSE (M-mode): 2.0 cm LEFT ATRIUM           Index       RIGHT ATRIUM           Index LA diam:      3.40 cm 2.00 cm/m  RA Area:     15.10 cm LA Vol (A2C): 13.3 ml 7.83 ml/m  RA Volume:   34.90 ml  20.56 ml/m LA Vol (A4C): 28.1 ml 16.55 ml/m  AORTIC VALVE LVOT  Vmax:   126.00 cm/s LVOT Vmean:  71.400 cm/s LVOT VTI:    0.150 m  AORTA Ao Root diam: 3.40 cm Ao Asc diam:  3.70 cm MITRAL VALVE               TRICUSPID VALVE MV Area (PHT): 5.19 cm    TR Peak grad:   30.9 mmHg MV Decel Time: 146 msec    TR Vmax:        278.00 cm/s MV E velocity: 95.14 cm/s MV A velocity: 84.80 cm/s  SHUNTS MV E/A ratio:  1.12        Systemic VTI:  0.15 m                            Systemic Diam: 2.20 cm Oswaldo Milian MD Electronically signed by Oswaldo Milian MD Signature Date/Time: 09/21/2020/6:08:46 PM    Final    US Abdomen Limited RUQ (LIVER/GB)  Result Date: 09/19/2020 CLINICAL DATA:  Transaminitis. EXAM: ULTRASOUND ABDOMEN LIMITED RIGHT UPPER QUADRANT COMPARISON:  None. FINDINGS: Gallbladder: No gallbladder wall thickening. Small amount  of biliary sludge. No sonographic Murphy sign noted by sonographer. Common bile duct: Diameter: 3 mm, normal Liver: No focal lesion identified. Within normal limits in parenchymal echogenicity. Portal vein is patent on color Doppler imaging with normal direction of blood flow towards the liver. Other: None. IMPRESSION: Biliary sludge without evidence of acute cholecystitis. Electronically Signed   By: Valentino Saxon MD   On: 09/19/2020 16:18    ASSESSMENT AND PLAN: 1.  Hodgkin's lymphoma 2.  Pneumonia 3.  Staph epidermis bacteremia 4.  Leukocytosis 5.  Normocytic anemia 6.  Chronic respiratory failure/pulmonary fibrosis 7.  Diabetes mellitus 8.  Severe protein calorie malnutrition   -He has evidence of disease progression on recent CT scan.  We cannot treat him at this time aggressively due to underlying pulmonary status.  Once he recovers from his acute illness, we can consider him for dose adjusted ICE protocol. -Continue antibiotics per ID/hospitalist for pneumonia and bacteremia. -Port-A-Cath to be removed per ID. -Transfuse PRBCs for hemoglobin less than 8. -Management of other chronic medical conditions per  hospitalist.   LOS: 3 days   Mikey Bussing, DNP, AGPCNP-BC, AOCNP   ADDENDUM: Unfortunately, the Port-A-Cath is going to have to come out.  With all of the blood cultures positive, and the fact that the organism is Staphylococcus, I think that leaving the Port-A-Cath in is going to just be a nidus for further infection.  I really hate that we have to take out the Port-A-Cath.  I totally agree with Dr. Baxter Flattery of Infectious Disease.  I realize this is going to be a real problem.  He is going need to have some type of central access.  I do not know if we can put a PICC line into him.  The Staphylococcus is sensitive to vancomycin.  We cannot treat him with chemotherapy until this Staph bacteremia is eradicated.  This probably will take at least 2 or 3 weeks.  He is going for an echocardiogram.  I would like to think that there is no problems with endocarditis.  I do appreciate the outstanding care he is getting by everybody up on 6 E.  Lattie Haw, MD  Hebrews 2:1   09/22/20

## 2020-09-22 NOTE — Procedures (Signed)
Interventional Radiology Procedure Note  Procedure: Removal of left chest portacatheter  Complications: None  Estimated Blood Loss: None  Recommendations: - Routine wound care  Signed,  Criselda Peaches, MD

## 2020-09-22 NOTE — Progress Notes (Signed)
   09/22/20 1655  Provider Notification  Provider Name/Title Dr. Louanne Belton  Date Provider Notified 09/22/20  Time Provider Notified 1173  Notification Type Page  Notification Reason Other (Comment) (ok to restart diet and tube feeds?)  Provider response See new orders ("ok" nursing to reorder)  Date of Provider Response 09/22/20  Time of Provider Response 1653

## 2020-09-22 NOTE — Progress Notes (Signed)
Calorie Count Note  48 hour calorie count ordered.  Diet: CHO modified Supplements: none  6/13: Breakfast: 218 kcals, 12g protein Lunch: 0 Dinner: 163 kcals, 2g protein Supplements: none  Total intake: 381 kcal (19% of minimum estimated needs)  14g protein (14% of minimum estimated needs)  Pt is NPO for procedures today.  Nutrition Dx: Severe Malnutrition related to chronic illness, cancer and cancer related treatments as evidenced by severe fat depletion, severe muscle depletion.  Goal: Pt to meet >/= 90% of their estimated nutrition needs    Intervention:  -D/c Calorie count -Continue Osmolite 1.2 @ 65 ml/hr via PEG -Continue 45 ml Prosource TF BID -Pt to eat PO as tolerated  Clayton Bibles, MS, RD, LDN Inpatient Clinical Dietitian Contact information available via Amion

## 2020-09-23 ENCOUNTER — Inpatient Hospital Stay (HOSPITAL_COMMUNITY): Payer: BC Managed Care – PPO

## 2020-09-23 ENCOUNTER — Other Ambulatory Visit: Payer: Self-pay | Admitting: Medical

## 2020-09-23 DIAGNOSIS — R6251 Failure to thrive (child): Secondary | ICD-10-CM | POA: Diagnosis not present

## 2020-09-23 DIAGNOSIS — R7881 Bacteremia: Secondary | ICD-10-CM | POA: Diagnosis present

## 2020-09-23 DIAGNOSIS — J9611 Chronic respiratory failure with hypoxia: Secondary | ICD-10-CM | POA: Diagnosis not present

## 2020-09-23 DIAGNOSIS — C8198 Hodgkin lymphoma, unspecified, lymph nodes of multiple sites: Secondary | ICD-10-CM | POA: Diagnosis not present

## 2020-09-23 LAB — CBC WITH DIFFERENTIAL/PLATELET
Abs Immature Granulocytes: 0.65 10*3/uL — ABNORMAL HIGH (ref 0.00–0.07)
Basophils Absolute: 0.1 10*3/uL (ref 0.0–0.1)
Basophils Relative: 0 %
Eosinophils Absolute: 0 10*3/uL (ref 0.0–0.5)
Eosinophils Relative: 0 %
HCT: 34 % — ABNORMAL LOW (ref 39.0–52.0)
Hemoglobin: 9.9 g/dL — ABNORMAL LOW (ref 13.0–17.0)
Immature Granulocytes: 3 %
Lymphocytes Relative: 5 %
Lymphs Abs: 1 10*3/uL (ref 0.7–4.0)
MCH: 29 pg (ref 26.0–34.0)
MCHC: 29.1 g/dL — ABNORMAL LOW (ref 30.0–36.0)
MCV: 99.7 fL (ref 80.0–100.0)
Monocytes Absolute: 1.6 10*3/uL — ABNORMAL HIGH (ref 0.1–1.0)
Monocytes Relative: 8 %
Neutro Abs: 16 10*3/uL — ABNORMAL HIGH (ref 1.7–7.7)
Neutrophils Relative %: 84 %
Platelets: 236 10*3/uL (ref 150–400)
RBC: 3.41 MIL/uL — ABNORMAL LOW (ref 4.22–5.81)
RDW: 17.6 % — ABNORMAL HIGH (ref 11.5–15.5)
WBC: 19.2 10*3/uL — ABNORMAL HIGH (ref 4.0–10.5)
nRBC: 0 % (ref 0.0–0.2)

## 2020-09-23 LAB — IRON AND TIBC
Iron: 17 ug/dL — ABNORMAL LOW (ref 45–182)
Saturation Ratios: 16 % — ABNORMAL LOW (ref 17.9–39.5)
TIBC: 106 ug/dL — ABNORMAL LOW (ref 250–450)
UIBC: 89 ug/dL

## 2020-09-23 LAB — COMPREHENSIVE METABOLIC PANEL
ALT: 19 U/L (ref 0–44)
AST: 11 U/L — ABNORMAL LOW (ref 15–41)
Albumin: 2.1 g/dL — ABNORMAL LOW (ref 3.5–5.0)
Alkaline Phosphatase: 73 U/L (ref 38–126)
Anion gap: 4 — ABNORMAL LOW (ref 5–15)
BUN: 23 mg/dL (ref 8–23)
CO2: 42 mmol/L — ABNORMAL HIGH (ref 22–32)
Calcium: 9.4 mg/dL (ref 8.9–10.3)
Chloride: 94 mmol/L — ABNORMAL LOW (ref 98–111)
Creatinine, Ser: 0.51 mg/dL — ABNORMAL LOW (ref 0.61–1.24)
GFR, Estimated: >60 mL/min (ref >60)
Glucose, Bld: 317 mg/dL — ABNORMAL HIGH (ref 70–99)
Potassium: 4.7 mmol/L (ref 3.5–5.1)
Sodium: 140 mmol/L (ref 135–145)
Total Bilirubin: 0.3 mg/dL (ref 0.3–1.2)
Total Protein: 6.3 g/dL — ABNORMAL LOW (ref 6.5–8.1)

## 2020-09-23 LAB — GLUCOSE, CAPILLARY
Glucose-Capillary: 281 mg/dL — ABNORMAL HIGH (ref 70–99)
Glucose-Capillary: 227 mg/dL — ABNORMAL HIGH (ref 70–99)
Glucose-Capillary: 289 mg/dL — ABNORMAL HIGH (ref 70–99)
Glucose-Capillary: 257 mg/dL — ABNORMAL HIGH (ref 70–99)

## 2020-09-23 LAB — VITAMIN B1: Vitamin B1 (Thiamine): 34 nmol/L — ABNORMAL HIGH (ref 8–30)

## 2020-09-23 MED ORDER — SODIUM CHLORIDE 0.9 % IV SOLN
2.0000 g | Freq: Three times a day (TID) | INTRAVENOUS | Status: DC
  Administered 2020-09-23 (×2): 2 g via INTRAVENOUS
  Filled 2020-09-23 (×2): qty 2

## 2020-09-23 NOTE — Progress Notes (Signed)
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  For 3 weeks he had done good but 5 days prior to presentation patient started having poor appetite malaise and failure to thrive with increased concern for pneumonia.  Patient then presented to hospital.  Patient does have baseline x-ray abnormalities from previous COVID so it was difficult to see the change.  Patient was started empirically on vancomycin and cefepime and was admitted to hospital.  Oncology team was consulted as well.  Assessment & Plan:   Active Problems:   Failure to thrive (child)  Possible pneumonia: Patient has history of chronic respiratory failure on 8 L of oxygen at baseline status post tracheostomy.  History of recurrent pneumonia with prior history of COVID and pulmonary fibrosis.  Patient was on prolonged ventilation for almost 8 months last year.  Chest x-ray this time shows possible recurrent pneumonia but difficult to interpret.   CT scan of the chest was performed which showed interval increase in bulk of adenopathy in the metastasis consistent with known lymphoma with increased axillary lymph nodes.  Unchanged left pleural fluid and fibrosis.  Leukocytosis has slightly trended down.  Seen by speech therapy and has mild aspiration risk.  Recommended regular solids with thin liquids.  On  cefepime and vancomycin  Staph epidermidis bacteremia.  Both bottles are positive.  Likely true bacteremia.  Continue  vancomycin.  Status post Port-A-Cath removal.  ID on  board.  Failure to thrive: With significant anorexia from advanced lymphoma.  Patient has Hodgkin's lymphoma progressive malignancy.  Oncology on board.  Chronic respiratory failure/pulmonary fibrosis: -History of COVID, intubation for very long time (8 months) and tracheostomy. Patient uses 8 L of oxygen at home with tracheostomy collar.  Currently needing 10 L/min saturating 96%..  Hodgkin's lymphoma: CT scan concerning for worsening lymphadenopathy and progression.  Oncology on board.  Management as per oncology.     Diabetes mellitus: Continue sliding-scale insulin, Accu-Cheks, diabetic diet.  Severe protein calorie malnutrition: Present on admission.  Nutritional supplements as per dietary services.    Hyponatremia: Mild.  Possible SIADH.  Continue to monitor.  Ethics/ goals of care.  Patient appears to be very deconditioned  with progressive lymphoma now with infection. Feeble and severely malnourished.  I think the prognosis is poor in this situation.   DVT prophylaxis:  Subcutaneous Lovenox  Code Status: Full code  Family Communication:  None today.  Spoke with the patient's spouse Ms. Alroy Dust on the phone and updated her about the clinical condition of the patient yesterday.  Disposition Plan:  Status is: Inpatient   Remains inpatient appropriate because:IV treatments appropriate due to intensity of illness or inability to take PO and Inpatient level of care appropriate due to severity of illness, bacteremia   Dispo: The patient is from: Home              Anticipated d/c is to: Home PT. PT evaluation pending.              Patient currently is not medically stable to d/c.  Difficult to place patient No   Consultants:  Hematology/oncology   Procedures:  None.  Antimicrobials:   IV vancomycin  IV cefepime.   Subjective: Today, patient was seen and examined at bedside.  Patient appears to be extremely weak with some shortness of breath.  Appears  to be slightly somnolent  Objective: Vitals:   09/23/20 0536 09/23/20 0734 09/23/20 1200 09/23/20 1207  BP: 109/70     Pulse: 100 92    Resp: 20  17   Temp: 98.3 F (36.8 C)     TempSrc: Oral     SpO2: 98% 96%  96%  Weight:        Intake/Output Summary (Last 24 hours) at 09/23/2020 1231 Last data filed at 09/23/2020 0998 Gross per 24 hour  Intake 480 ml  Output 600 ml  Net -120 ml    Filed Weights   09/19/20 1930  Weight: 63.3 kg    Physical examination: General: Cachectic built,  chronically ill and deconditioned, not in obvious distress HENT:   No scleral pallor or icterus noted. Oral mucosa is moist.  Chest: Diminished breath sounds bilaterally.  Coarse breath sounds noted..  CVS: S1 &S2 heard. No murmur.  Regular rate and rhythm. Abdomen: Soft, nontender, nondistended.  Bowel sounds are heard.   Extremities: No cyanosis, clubbing or edema.  Peripheral pulses are palpable. Psych: Mildly somnolent but conversive, closing his eyes, CNS:  No cranial nerve deficits.  Generalized weakness noted, moving extremities Skin: Warm and dry.  No rashes noted.   Data Reviewed: I have personally reviewed following labs and imaging studies  CBC: Recent Labs  Lab 09/18/20 1212 09/19/20 1511 09/20/20 1015 09/22/20 0536 09/23/20 1026  WBC 18.6 Repeated and verified X2.* 19.0* 24.9* 17.2* 19.2*  NEUTROABS 16.5* 15.1* 21.8*  --  16.0*  HGB 10.8* 10.2* 10.2* 9.1* 9.9*  HCT 33.9* 33.1* 33.3* 29.8* 34.0*  MCV 90.6 93.2 93.5 97.1 99.7  PLT 384.0 313 314 237 338    Basic Metabolic Panel: Recent Labs  Lab 09/18/20 1212 09/19/20 1511 09/20/20 0708 09/22/20 0536 09/23/20 1026  NA 135 131* 135 138 140  K 5.9 No hemolysis seen* 4.4 4.7 4.1 4.7  CL 82* 81* 87* 92* 94*  CO2 49* 40* 43* 43* 42*  GLUCOSE 391* 223* 254* 326* 317*  BUN 24* 23 25* 25* 23  CREATININE 0.53 0.43* 0.55* 0.51* 0.51*  CALCIUM 9.9 9.2 9.6 9.3 9.4  MG  --   --  1.9 2.1  --   PHOS  --   --   --  2.7  --      GFR: Estimated Creatinine Clearance: 81.1 mL/min (A) (by C-G formula based on SCr of 0.51 mg/dL (L)). Liver Function Tests: Recent Labs  Lab 09/18/20 1212 09/19/20 1511 09/20/20 0708 09/23/20 1026  AST 17 15 15  11*  ALT 34 28 30 19   ALKPHOS 113 83 82 73  BILITOT 0.6 0.6 0.8 0.3  PROT 6.5 6.3* 6.7 6.3*  ALBUMIN 3.0* 2.3* 2.2* 2.1*    No results for input(s): LIPASE, AMYLASE in the last 168 hours. No results for input(s): AMMONIA in the last 168 hours. Coagulation Profile: No results for input(s): INR, PROTIME in the last 168 hours. Cardiac Enzymes: Recent Labs  Lab 09/19/20 1511  CKTOTAL 11*    BNP (last 3 results) No results for input(s): PROBNP in the last 8760 hours. HbA1C: No results for input(s): HGBA1C in the last 72 hours.  CBG: Recent Labs  Lab 09/22/20  [place] 09/22/20 1824 09/22/20 2223 09/23/20 0731 09/23/20 1147  GLUCAP 115* 165* 371* 281* 227*    Lipid Profile: No results for input(s): CHOL, HDL, LDLCALC, TRIG, CHOLHDL, LDLDIRECT in the last 72 hours. Thyroid Function Tests: No results for input(s): TSH, T4TOTAL, FREET4, T3FREE, THYROIDAB in the last 72 hours.  Anemia Panel: Recent Labs    09/23/20 1026  TIBC 106*  IRON 17*    Urine analysis:    Component Value Date/Time   COLORURINE YELLOW 09/19/2020 1511   APPEARANCEUR HAZY (A) 09/19/2020 1511   LABSPEC 1.012 09/19/2020 1511   PHURINE 7.0 09/19/2020 1511   GLUCOSEU NEGATIVE 09/19/2020 1511   HGBUR NEGATIVE 09/19/2020 1511   HGBUR negative 03/23/2007 0000   BILIRUBINUR NEGATIVE 09/19/2020 1511   BILIRUBINUR neg 11/11/2016 1017   KETONESUR NEGATIVE 09/19/2020 1511   PROTEINUR NEGATIVE 09/19/2020 1511   UROBILINOGEN 0.2 11/11/2016 1017   UROBILINOGEN 0.2 11/08/2012 0915   NITRITE NEGATIVE 09/19/2020 1511   LEUKOCYTESUR NEGATIVE 09/19/2020 1511   Sepsis Labs: @LABRCNTIP (procalcitonin:4,lacticidven:4)  ) Recent Results (from the past 240 hour(s))  Urine culture     Status:  Abnormal   Collection Time: 09/19/20  3:11 PM   Specimen: Urine, Clean Catch  Result Value Ref Range Status   Specimen Description   Final    URINE, CLEAN CATCH Performed at [facility], [place] [address] [place], [place] [address]    Special Requests   Final    NONE Performed at [facility], [place] [address] [place], [place] [address]    Culture MULTIPLE SPECIES PRESENT, SUGGEST RECOLLECTION (A)  Final   Report Status 09/21/2020 FINAL  Final  Culture, blood (routine x 2)     Status: Abnormal   Collection Time: 09/19/20  3:11 PM   Specimen: BLOOD  Result Value Ref Range Status   Specimen Description   Final    BLOOD PORTA CATH Performed at [place] [address], [place], [place] [address]    Special Requests   Final    BOTTLES DRAWN AEROBIC AND ANAEROBIC Blood Culture results may not be optimal due to an excessive volume of blood received in culture bottles Performed at [place] [address], [place], [place] [address]    Culture  Setup Time   Final    GRAM POSITIVE COCCI IN BOTH AEROBIC AND ANAEROBIC BOTTLES CRITICAL RESULT CALLED TO, READ BACK BY AND VERIFIED WITH: M,SWAYNE PHARMD @1552  09/20/20 EB Performed at [facility], [place] [address] [place], [place] [address]    Culture STAPHYLOCOCCUS EPIDERMIDIS (A)  Final   Report Status 09/22/2020 FINAL  Final   Organism ID, Bacteria STAPHYLOCOCCUS EPIDERMIDIS  Final      Susceptibility   Staphylococcus epidermidis - MIC*    CIPROFLOXACIN >=8 RESISTANT Resistant     ERYTHROMYCIN >=8 RESISTANT Resistant     GENTAMICIN 8 INTERMEDIATE Intermediate     OXACILLIN >=4 RESISTANT Resistant     TETRACYCLINE >=16 RESISTANT Resistant     VANCOMYCIN 1 SENSITIVE Sensitive     TRIMETH/SULFA 80 RESISTANT Resistant     CLINDAMYCIN >=8 RESISTANT Resistant     RIFAMPIN <=0.5 SENSITIVE Sensitive     Inducible Clindamycin NEGATIVE Sensitive     *  STAPHYLOCOCCUS EPIDERMIDIS  Blood Culture ID Panel (Reflexed)     Status: Abnormal   Collection Time: 09/19/20  3:11 PM  Result Value Ref Range Status   Enterococcus faecalis NOT DETECTED NOT DETECTED Final  Enterococcus Faecium NOT DETECTED NOT DETECTED Final   Listeria monocytogenes NOT DETECTED NOT DETECTED Final   Staphylococcus species DETECTED (A) NOT DETECTED Final    Comment: CRITICAL RESULT CALLED TO, READ BACK BY AND VERIFIED WITH: M,SWAYNE PHARMD @1552  09/20/20 EB    Staphylococcus aureus (BCID) NOT DETECTED NOT DETECTED Final   Staphylococcus epidermidis DETECTED (A) NOT DETECTED Final    Comment: Methicillin (oxacillin) resistant coagulase negative staphylococcus. Possible blood culture contaminant (unless isolated from more than one blood culture draw or clinical case suggests pathogenicity). No antibiotic treatment is indicated for blood  culture contaminants. CRITICAL RESULT CALLED TO, READ BACK BY AND VERIFIED WITH: M,SWAYNE PHARMD @1552  09/20/20 EB    Staphylococcus lugdunensis NOT DETECTED NOT DETECTED Final   Streptococcus species NOT DETECTED NOT DETECTED Final   Streptococcus agalactiae NOT DETECTED NOT DETECTED Final   Streptococcus pneumoniae NOT DETECTED NOT DETECTED Final   Streptococcus pyogenes NOT DETECTED NOT DETECTED Final   A.calcoaceticus-baumannii NOT DETECTED NOT DETECTED Final   Bacteroides fragilis NOT DETECTED NOT DETECTED Final   Enterobacterales NOT DETECTED NOT DETECTED Final   Enterobacter cloacae complex NOT DETECTED NOT DETECTED Final   Escherichia coli NOT DETECTED NOT DETECTED Final   Klebsiella aerogenes NOT DETECTED NOT DETECTED Final   Klebsiella oxytoca NOT DETECTED NOT DETECTED Final   Klebsiella pneumoniae NOT DETECTED NOT DETECTED Final   Proteus species NOT DETECTED NOT DETECTED Final   Salmonella species NOT DETECTED NOT DETECTED Final   Serratia marcescens NOT DETECTED NOT DETECTED Final   Haemophilus influenzae NOT DETECTED NOT  DETECTED Final   Neisseria meningitidis NOT DETECTED NOT DETECTED Final   Pseudomonas aeruginosa NOT DETECTED NOT DETECTED Final   Stenotrophomonas maltophilia NOT DETECTED NOT DETECTED Final   Candida albicans NOT DETECTED NOT DETECTED Final   Candida auris NOT DETECTED NOT DETECTED Final   Candida glabrata NOT DETECTED NOT DETECTED Final   Candida krusei NOT DETECTED NOT DETECTED Final   Candida parapsilosis NOT DETECTED NOT DETECTED Final   Candida tropicalis NOT DETECTED NOT DETECTED Final   Cryptococcus neoformans/gattii NOT DETECTED NOT DETECTED Final   Methicillin resistance mecA/C DETECTED (A) NOT DETECTED Final    Comment: CRITICAL RESULT CALLED TO, READ BACK BY AND VERIFIED WITH: M,SWAYNE PHARMD @1552  09/20/20 EB Performed at [facility], [address], [place], [place] [address]   Culture, blood (routine x 2)     Status: Abnormal   Collection Time: 09/19/20  3:16 PM   Specimen: BLOOD  Result Value Ref Range Status   Specimen Description   Final    BLOOD SITE NOT SPECIFIED Performed at [place] [address] [place], [place] [address]    Special Requests   Final    BOTTLES DRAWN AEROBIC AND ANAEROBIC Blood Culture adequate volume Performed at [place] [address], [place], [place] [address]    Culture  Setup Time   Final    GRAM POSITIVE COCCI IN BOTH AEROBIC AND ANAEROBIC BOTTLES CRITICAL VALUE NOTED.  VALUE IS CONSISTENT WITH PREVIOUSLY REPORTED AND CALLED VALUE.    Culture (A)  Final    STAPHYLOCOCCUS EPIDERMIDIS SUSCEPTIBILITIES PERFORMED ON PREVIOUS CULTURE WITHIN THE LAST 5 DAYS. Performed at [facility], [place] [address], [place], [place] [address]    Report Status 09/22/2020 FINAL  Final  Resp Panel by RT-PCR (Flu A&B, Covid) Nasopharyngeal Swab     Status: None   Collection Time: 09/19/20  3:49 PM   Specimen:  Nasopharyngeal Swab; Nasopharyngeal(NP) swabs in vial transport medium  Result Value  Ref Range Status   SARS Coronavirus 2 by RT PCR NEGATIVE NEGATIVE Final    Comment: (NOTE) SARS-CoV-2 target nucleic acids are NOT DETECTED.  The SARS-CoV-2 RNA is generally detectable in upper respiratory specimens during the acute phase of infection. The lowest concentration of SARS-CoV-2 viral copies this assay can detect is 138 copies/mL. A negative result does not preclude SARS-Cov-2 infection and should not be used as the sole basis for treatment or other patient management decisions. A negative result may occur with  improper specimen collection/handling, submission of specimen other than nasopharyngeal swab, presence of viral mutation(s) within the areas targeted by this assay, and inadequate number of viral copies(<138 copies/mL). A negative result must be combined with clinical observations, patient history, and epidemiological information. The expected result is Negative.  Fact Sheet for Patients:  EntrepreneurPulse.com.au  Fact Sheet for Healthcare Providers:  IncredibleEmployment.be  This test is no t yet approved or cleared by the [place] FDA and  has been authorized for detection and/or diagnosis of SARS-CoV-2 by FDA under an Emergency Use Authorization (EUA). This EUA will remain  in effect (meaning this test can be used) for the duration of the COVID-19 declaration under Section 564(b)(1) of the Act, 21 U.S.C.section 360bbb-3(b)(1), unless the authorization is terminated  or revoked sooner.       Influenza A by PCR NEGATIVE NEGATIVE Final   Influenza B by PCR NEGATIVE NEGATIVE Final    Comment: (NOTE) The Xpert Xpress SARS-CoV-2/FLU/RSV plus assay is intended as an aid in the diagnosis of influenza from Nasopharyngeal swab specimens and should not be used as a sole basis for treatment. Nasal washings and aspirates are unacceptable for Xpert Xpress SARS-CoV-2/FLU/RSV testing.  Fact Sheet for  Patients: EntrepreneurPulse.com.au  Fact Sheet for Healthcare Providers: IncredibleEmployment.be  This test is not yet approved or cleared by the [place] FDA and has been authorized for detection and/or diagnosis of SARS-CoV-2 by FDA under an Emergency Use Authorization (EUA). This EUA will remain in effect (meaning this test can be used) for the duration of the COVID-19 declaration under Section 564(b)(1) of the Act, 21 U.S.C. section 360bbb-3(b)(1), unless the authorization is terminated or revoked.  Performed at [facility], [place] [address] [place], [place] [address]   MRSA PCR Screening     Status: None   Collection Time: 09/20/20  6:50 AM   Specimen: Nasopharyngeal  Result Value Ref Range Status   MRSA by PCR NEGATIVE NEGATIVE Final    Comment:        The GeneXpert MRSA Assay (FDA approved for NASAL specimens only), is one component of a comprehensive MRSA colonization surveillance program. It is not intended to diagnose MRSA infection nor to guide or monitor treatment for MRSA infections. Performed at [facility], [place] [address], [place], [place] [address]   Culture, blood (Routine X 2) w Reflex to ID Panel     Status: None (Preliminary result)   Collection Time: 09/22/20  7:06 PM   Specimen: BLOOD  Result Value Ref Range Status   Specimen Description   Final    BLOOD RIGHT ANTECUBITAL Performed at [place] [address], [place], [place] [address]    Special Requests   Final    BOTTLES DRAWN AEROBIC AND ANAEROBIC Blood Culture adequate volume Performed at [facility] [address], [place], [place] [address]    Culture  Final    NO GROWTH < 12 HOURS Performed at [place] [address], [place], [place] [address]    Report Status PENDING  Incomplete  Culture, blood (Routine X 2) w Reflex to ID Panel     Status: None  (Preliminary result)   Collection Time: 09/22/20  7:06 PM   Specimen: BLOOD RIGHT HAND  Result Value Ref Range Status   Specimen Description   Final    BLOOD RIGHT HAND Performed at [place] [address] [place], [place] [address]    Special Requests   Final    BOTTLES DRAWN AEROBIC ONLY Blood Culture adequate volume Performed at [facility] [address] [place], [place] [address]    Culture   Final    NO GROWTH < 12 HOURS Performed at [place] [address], [place], [place] [address]    Report Status PENDING  Incomplete         Radiology Studies: IR REMOVAL TUN ACCESS W/ PORT W/O FL MOD SED  Result Date: 09/22/2020 INDICATION: 65 year old male with a history of Hodgkin lymphoma currently presenting with bacteremia associated with pneumonia. He has a left chest port catheter which was placed by interventional radiology (Dr. Annamaria Boots) in December of 2020. he presents for port catheter removal to help facilitate clearance of bacteremia. EXAM: REMOVAL RIGHT IJ VEIN PORT-A-CATH MEDICATIONS: None ANESTHESIA/SEDATION: Moderate (conscious) sedation was employed during this procedure. A total of Versed 1 mg and Fentanyl 50 mcg was administered intravenously. Moderate Sedation Time: 12 minutes. The patient's level of consciousness and vital signs were monitored continuously by radiology nursing throughout the procedure under my direct supervision. FLUOROSCOPY TIME:  None. COMPLICATIONS: None immediate. PROCEDURE: Informed written consent was obtained from the patient after a thorough discussion of the procedural risks, benefits and alternatives. All questions were addressed. Maximal Sterile Barrier Technique was utilized including caps, mask, sterile gowns, sterile gloves, sterile drape, hand hygiene and skin antiseptic. A timeout was performed prior to the initiation of the procedure. The right chest was prepped and draped in a sterile  fashion. Lidocaine was utilized for local anesthesia. An incision was made over the previously healed surgical incision. Utilizing blunt dissection, the port catheter and reservoir were removed from the underlying subcutaneous tissue in their entirety. Securing sutures were also removed. The pocket was irrigated with a copious amount of sterile normal saline. The wound was examined. No evidence of purulence. The pocket was closed with interrupted 3-0 Vicryl stitches. The subcutaneous tissue was closed with 3-0 Vicryl interrupted subcutaneous stitches. Dermabond was applied. IMPRESSION: Successful right IJ vein Port-A-Cath explant. Electronically Signed   By: Jacqulynn Cadet M.D.   On: 09/22/2020 16:18   ECHOCARDIOGRAM COMPLETE  Result Date: 09/21/2020    ECHOCARDIOGRAM REPORT   Patient Name:   Steve [name] [facility] Date of Exam: 09/21/2020 Medical Rec #:  [id number]         Height:       65.0 in Accession #:    [id number]        Weight:       139.6 lb Date of Birth:  [birth year]          BSA:          1.698 m Patient Age:    33 years          BP:           103/66 mmHg Patient Gender: M  HR:           95 bpm. Exam Location:  Inpatient Procedure: 2D Echo, 3D Echo, Cardiac Doppler and Color Doppler Indications:    Bacteremia  History:        Patient has prior history of Echocardiogram examinations, most                 recent 07/09/2019. Abnormal ECG, Arrythmias:Tachycardia,                 Signs/Symptoms:Bacteremia, Shortness of Breath and Dyspnea; Risk                 Factors:Sleep Apnea, Hypertension and Diabetes. Cancer. Right                 pneumothorax. Subcutaneous air. Hypoxia.  Sonographer:    Roseanna Rainbow RDCS Referring Phys: [place] Comments: Technically difficult study due to poor echo windows, suboptimal parasternal window, suboptimal apical window and suboptimal subcostal window. Patient has trach collar. Off aix medial images in apical region. Feeding tube in subcostal  region. IMPRESSIONS  1. Left ventricular ejection fraction, by estimation, is 60 to 65%. The left ventricle has normal function. The left ventricle has no regional wall motion abnormalities. There is mild left ventricular hypertrophy. Left ventricular diastolic parameters were normal.  2. Right ventricular systolic function is normal. The right ventricular size is moderately enlarged. There is normal pulmonary artery systolic pressure.  3. The mitral valve is normal in structure. No evidence of mitral valve regurgitation.  4. The aortic valve was not well visualized. Aortic valve regurgitation is not visualized. No aortic stenosis is present.  5. The inferior vena cava is normal in size with greater than 50% respiratory variability, suggesting right atrial pressure of 3 mmHg. Conclusion(s)/Recommendation(s): Techincally difficult study, but no vegetaion seen. If high clinical supicion for endocarditis, consider TEE. FINDINGS  Left Ventricle: Left ventricular ejection fraction, by estimation, is 60 to 65%. The left ventricle has normal function. The left ventricle has no regional wall motion abnormalities. The left ventricular internal cavity size was small. There is mild left ventricular hypertrophy. Left ventricular diastolic parameters were normal. Right Ventricle: The right ventricular size is moderately enlarged. Right vetricular wall thickness was not well visualized. Right ventricular systolic function is normal. There is normal pulmonary artery systolic pressure. The tricuspid regurgitant velocity is 2.78 m/s, and with an assumed right atrial pressure of 3 mmHg, the estimated right ventricular systolic pressure is 95.2 mmHg. Left Atrium: Left atrial size was normal in size. Right Atrium: Right atrial size was normal in size. Pericardium: There is no evidence of pericardial effusion. Mitral Valve: The mitral valve is normal in structure. No evidence of mitral valve regurgitation. Tricuspid Valve: The tricuspid  valve is normal in structure. Tricuspid valve regurgitation is trivial. Aortic Valve: The aortic valve was not well visualized. Aortic valve regurgitation is not visualized. No aortic stenosis is present. Pulmonic Valve: The pulmonic valve was not well visualized. Pulmonic valve regurgitation is not visualized. Aorta: The aortic root is normal in size and structure. Venous: The inferior vena cava is normal in size with greater than 50% respiratory variability, suggesting right atrial pressure of 3 mmHg. IAS/Shunts: The interatrial septum was not well visualized.  LEFT VENTRICLE PLAX 2D LVIDd:         3.60 cm      Diastology LVIDs:         2.30 cm      LV e'  medial:    11.20 cm/s LV PW:         1.30 cm      LV E/e' medial:  8.5 LV IVS:        0.90 cm      LV e' lateral:   12.00 cm/s LVOT diam:     2.20 cm      LV E/e' lateral: 7.9 LV SV:         57 LV SV Index:   34 LVOT Area:     3.80 cm  LV Volumes (MOD) LV vol d, MOD A2C: 66.9 ml LV vol d, MOD A4C: 101.0 ml LV vol s, MOD A2C: 22.7 ml LV vol s, MOD A4C: 24.2 ml LV SV MOD A2C:     44.2 ml LV SV MOD A4C:     101.0 ml LV SV MOD BP:      78.9 ml RIGHT VENTRICLE             IVC RV S prime:     18.20 cm/s  IVC diam: 1.80 cm TAPSE (M-mode): 2.0 cm LEFT ATRIUM           Index       RIGHT ATRIUM           Index LA diam:      3.40 cm 2.00 cm/m  RA Area:     15.10 cm LA Vol (A2C): 13.3 ml 7.83 ml/m  RA Volume:   34.90 ml  20.56 ml/m LA Vol (A4C): 28.1 ml 16.55 ml/m  AORTIC VALVE LVOT Vmax:   126.00 cm/s LVOT Vmean:  71.400 cm/s LVOT VTI:    0.150 m  AORTA Ao Root diam: 3.40 cm Ao Asc diam:  3.70 cm MITRAL VALVE               TRICUSPID VALVE MV Area (PHT): 5.19 cm    TR Peak grad:   30.9 mmHg MV Decel Time: 146 msec    TR Vmax:        278.00 cm/s MV E velocity: 95.14 cm/s MV A velocity: 84.80 cm/s  SHUNTS MV E/A ratio:  1.12        Systemic VTI:  0.15 m                            Systemic Diam: 2.20 cm Oswaldo Milian MD Electronically signed by Oswaldo Milian MD Signature Date/Time: 09/21/2020/6:08:46 PM    Final        Scheduled Meds:  Chlorhexidine Gluconate Cloth  6 each Topical Daily   cholecalciferol  10,000 Units Oral Daily   enoxaparin (LOVENOX) injection  40 mg Subcutaneous Q24H   feeding supplement (PROSource TF)  45 mL Per Tube BID   hydrocortisone sod succinate (SOLU-CORTEF) inj  50 mg Intravenous Q8H   insulin aspart  0-5 Units Subcutaneous QHS   insulin aspart  0-9 Units Subcutaneous TID WC   midodrine  5 mg Oral TID WC   multivitamin  15 mL Per Tube Daily   pantoprazole sodium  40 mg Per Tube Daily   sertraline  50 mg Oral Daily   traZODone  100 mg Per Tube QHS   Continuous Infusions:  ceFEPime (MAXIPIME) IV 2 g (09/23/20 1010)   feeding supplement (OSMOLITE 1.2 CAL) 1,000 mL (09/21/20 2322)   vancomycin 750 mg (09/23/20 0419)     LOS: 4 days    Flora Lipps, MD  [facility] 7PM-7AM  contact night coverage

## 2020-09-23 NOTE — Progress Notes (Signed)
[facility] for Infectious Disease    Date of Admission:  09/19/2020   Total days of antibiotics 5/vanco and cefepime  ID: Steve [name] is a 65 y.o. male with  staph epi bacteremia in setting of portacath Principal Problem:   Failure to thrive in adult Active Problems:   Tracheostomy dependence ([name])   Chronic respiratory failure with hypoxia ([name])   Hodgkin's lymphoma ([place])   Essential hypertension   Bacteremia    Subjective: Portacath removed. Afebrile.  Medications:   Chlorhexidine Gluconate Cloth  6 each Topical Daily   cholecalciferol  10,000 Units Oral Daily   enoxaparin (LOVENOX) injection  40 mg Subcutaneous Q24H   feeding supplement (PROSource TF)  45 mL Per Tube BID   hydrocortisone sod succinate (SOLU-CORTEF) inj  50 mg Intravenous Q8H   insulin aspart  0-5 Units Subcutaneous QHS   insulin aspart  0-9 Units Subcutaneous TID WC   midodrine  5 mg Oral TID WC   multivitamin  15 mL Per Tube Daily   pantoprazole sodium  40 mg Per Tube Daily   sertraline  50 mg Oral Daily   traZODone  100 mg Per Tube QHS    Objective: Vital signs in last 24 hours: Temp:  [98.3 F (36.8 C)-98.5 F (36.9 C)] 98.5 F (36.9 C) (06/15 1356) Pulse Rate:  [92-107] 98 (06/15 1356) Resp:  [16-33] 16 (06/15 1356) BP: (99-112)/(57-72) 112/72 (06/15 1356) SpO2:  [90 %-98 %] 90 % (06/15 1356) FiO2 (%):  [80 %] 80 % (06/15 1207)     Lab Results Recent Labs    09/22/20 0536 09/23/20 1026  WBC 17.2* 19.2*  HGB 9.1* 9.9*  HCT 29.8* 34.0*  NA 138 140  K 4.1 4.7  CL 92* 94*  CO2 43* 42*  BUN 25* 23  CREATININE 0.51* 0.51*   Liver Panel Recent Labs    09/23/20 1026  PROT 6.3*  ALBUMIN 2.1*  AST 11*  ALT 19  ALKPHOS 73  BILITOT 0.3   Sedimentation Rate No results for input(s): ESRSEDRATE in the last 72 hours. C-Reactive Protein No results for input(s): CRP in the last 72 hours.  Microbiology:  Studies/Results: IR REMOVAL TUN ACCESS W/ PORT W/O FL MOD  SED  Result Date: 09/22/2020 INDICATION: 65 year old male with a history of Hodgkin lymphoma currently presenting with bacteremia associated with pneumonia. He has a left chest port catheter which was placed by interventional radiology (Dr. Annamaria Boots) in December of 2020. he presents for port catheter removal to help facilitate clearance of bacteremia. EXAM: REMOVAL RIGHT IJ VEIN PORT-A-CATH MEDICATIONS: None ANESTHESIA/SEDATION: Moderate (conscious) sedation was employed during this procedure. A total of Versed 1 mg and Fentanyl 50 mcg was administered intravenously. Moderate Sedation Time: 12 minutes. The patient's level of consciousness and vital signs were monitored continuously by radiology nursing throughout the procedure under my direct supervision. FLUOROSCOPY TIME:  None. COMPLICATIONS: None immediate. PROCEDURE: Informed written consent was obtained from the patient after a thorough discussion of the procedural risks, benefits and alternatives. All questions were addressed. Maximal Sterile Barrier Technique was utilized including caps, mask, sterile gowns, sterile gloves, sterile drape, hand hygiene and skin antiseptic. A timeout was performed prior to the initiation of the procedure. The right chest was prepped and draped in a sterile fashion. Lidocaine was utilized for local anesthesia. An incision was made over the previously healed surgical incision. Utilizing blunt dissection, the port catheter and reservoir were removed from the underlying subcutaneous tissue in their entirety.  Securing sutures were also removed. The pocket was irrigated with a copious amount of sterile normal saline. The wound was examined. No evidence of purulence. The pocket was closed with interrupted 3-0 Vicryl stitches. The subcutaneous tissue was closed with 3-0 Vicryl interrupted subcutaneous stitches. Dermabond was applied. IMPRESSION: Successful right IJ vein Port-A-Cath explant. Electronically Signed   By: Jacqulynn Cadet  M.D.   On: 09/22/2020 16:18     Assessment/Plan: Staph epi bacteremia= recommend to treat for a minimum of 2 wk. Recommend to get TEE to ensure no endocarditis. Will stop cefepime Will follow up on blood cx to ensure it has cleared.  Leukocytosis = thought to be due to HL recurrence.and steroids. Continue to monitor   [facility] for Infectious Diseases Cell: [phone] Pager: [phone]  09/23/2020, 5:56 PM

## 2020-09-23 NOTE — Progress Notes (Signed)
Inpatient Diabetes Program Recommendations  AACE/ADA: New Consensus Statement on Inpatient Glycemic Control (2015)  Target Ranges:  Prepandial:   less than 140 mg/dL      Peak postprandial:   less than 180 mg/dL (1-2 hours)      Critically ill patients:  140 - 180 mg/dL   Lab Results  Component Value Date   GLUCAP 227 (H) 09/23/2020   HGBA1C 7.2 (H) 09/18/2020    Review of Glycemic Control Results for Steve [name], Steve [name] (MRN [id number]) as of 09/23/2020 13:59  Ref. Range 09/22/2020 17:16 09/22/2020 18:24 09/22/2020 22:23 09/23/2020 07:31 09/23/2020 11:47  Glucose-Capillary Latest Ref Range: 70 - 99 mg/dL 115 (H) 165 (H) 371 (H) 281 (H) 227 (H)   Diabetes history: DM 2 Outpatient Diabetes Medications: None Current orders for Inpatient glycemic control:  Novolog 0-9 units tid + hs  A1c 7.2% Solucortef 50 mg Q8 hours  Inpatient Diabetes Program Recommendations:    -  Add Levemir 8 units  Thanks,  Tama Headings RN, MSN, BC-ADM Inpatient Diabetes Coordinator Team Pager [phone] (8a-5p)

## 2020-09-23 NOTE — Plan of Care (Signed)
Pt was able to comfortably sleep after total hygiene provided; peg tube dressing changed; no s/s of acute distress or pain reported or observed; call light within reach and bed at lowest position for safety.

## 2020-09-23 NOTE — Progress Notes (Signed)
Steve [name] had the Port-A-Cath removed yesterday.  This was done because of the positive blood culture for Staphylococcus epidermidis.  I hate that the Port-A-Cath had to be removed but I think this was the right move because this bacteria likes to "stick" to these Port-A-Cath and could ultimately affect the heart valves.  He did have an echocardiogram done.  The valves all looked okay.  Repeat blood cultures were negative.Marland Kitchen  He is on vancomycin.  He also was on Maxipime.  He is going to need to have some kind of IV access for treatment for the Hodgkin's disease.  I am not sure a another Port-A-Cath can be placed right now.  I would consider a PICC line for him.  I realize this can be a real aggravation with having to be flushed.  However, I think this would be the right way to go so that we can have some kind of access centrally for chemotherapy.  I probably would consider ICE protocol.  This is highly effective for Hodgkin's disease.  However, it is incredibly myelosuppressive.  We would have to be really, really cautious with Steve [name] given his overall health status and pulmonary function.  There is no labs on him today.  We really need to see what his labs look like.  He is a little bit more tired this morning.  His vital signs are temperature 98.3.  Pulse 100.  Blood pressure 109/70.  Oxygen saturation is 98%.  His lungs show scattered rhonchi and wheezes.  He seems to have decent air movement.  Cardiac exam tachycardic but regular.  Abdomen is soft.  Ms. Hewitt Blade has a Staph bacteremia.  He had 4/4 cultures positive.  Thankfully it is sensitive to vancomycin.  I would think that he is going to need a couple weeks of antibiotics for this bacteremia.  It will be interesting to see if the catheter tip contains any bacteria.  Again, I think a PICC line will be the way to go for central access.   Lattie Haw, MD  Psalms 25:17

## 2020-09-24 ENCOUNTER — Inpatient Hospital Stay (HOSPITAL_COMMUNITY): Payer: BC Managed Care – PPO

## 2020-09-24 DIAGNOSIS — C8190 Hodgkin lymphoma, unspecified, unspecified site: Secondary | ICD-10-CM

## 2020-09-24 DIAGNOSIS — R601 Generalized edema: Secondary | ICD-10-CM | POA: Diagnosis not present

## 2020-09-24 DIAGNOSIS — R627 Adult failure to thrive: Secondary | ICD-10-CM | POA: Diagnosis not present

## 2020-09-24 DIAGNOSIS — J8410 Pulmonary fibrosis, unspecified: Secondary | ICD-10-CM | POA: Diagnosis not present

## 2020-09-24 DIAGNOSIS — J9621 Acute and chronic respiratory failure with hypoxia: Secondary | ICD-10-CM

## 2020-09-24 LAB — CBC WITH DIFFERENTIAL/PLATELET
Abs Immature Granulocytes: 0.58 10*3/uL — ABNORMAL HIGH (ref 0.00–0.07)
Basophils Absolute: 0.1 10*3/uL (ref 0.0–0.1)
Basophils Relative: 0 %
Eosinophils Absolute: 0 10*3/uL (ref 0.0–0.5)
Eosinophils Relative: 0 %
HCT: 35.3 % — ABNORMAL LOW (ref 39.0–52.0)
Hemoglobin: 10 g/dL — ABNORMAL LOW (ref 13.0–17.0)
Immature Granulocytes: 3 %
Lymphocytes Relative: 8 %
Lymphs Abs: 1.7 10*3/uL (ref 0.7–4.0)
MCH: 28.9 pg (ref 26.0–34.0)
MCHC: 28.3 g/dL — ABNORMAL LOW (ref 30.0–36.0)
MCV: 102 fL — ABNORMAL HIGH (ref 80.0–100.0)
Monocytes Absolute: 1.7 10*3/uL — ABNORMAL HIGH (ref 0.1–1.0)
Monocytes Relative: 8 %
Neutro Abs: 16.4 10*3/uL — ABNORMAL HIGH (ref 1.7–7.7)
Neutrophils Relative %: 81 %
Platelets: 218 10*3/uL (ref 150–400)
RBC: 3.46 MIL/uL — ABNORMAL LOW (ref 4.22–5.81)
RDW: 17.5 % — ABNORMAL HIGH (ref 11.5–15.5)
WBC: 20.4 10*3/uL — ABNORMAL HIGH (ref 4.0–10.5)
nRBC: 0 % (ref 0.0–0.2)

## 2020-09-24 LAB — COMPREHENSIVE METABOLIC PANEL
ALT: 17 U/L (ref 0–44)
AST: 12 U/L — ABNORMAL LOW (ref 15–41)
Albumin: 2.2 g/dL — ABNORMAL LOW (ref 3.5–5.0)
Alkaline Phosphatase: 73 U/L (ref 38–126)
Anion gap: 5 (ref 5–15)
BUN: 24 mg/dL — ABNORMAL HIGH (ref 8–23)
CO2: 41 mmol/L — ABNORMAL HIGH (ref 22–32)
Calcium: 9.7 mg/dL (ref 8.9–10.3)
Chloride: 95 mmol/L — ABNORMAL LOW (ref 98–111)
Creatinine, Ser: 0.55 mg/dL — ABNORMAL LOW (ref 0.61–1.24)
GFR, Estimated: >60 mL/min (ref >60)
Glucose, Bld: 391 mg/dL — ABNORMAL HIGH (ref 70–99)
Potassium: 4.7 mmol/L (ref 3.5–5.1)
Sodium: 141 mmol/L (ref 135–145)
Total Bilirubin: 0.2 mg/dL — ABNORMAL LOW (ref 0.3–1.2)
Total Protein: 6.4 g/dL — ABNORMAL LOW (ref 6.5–8.1)

## 2020-09-24 LAB — BLOOD GAS, ARTERIAL
Acid-Base Excess: 14.4 mmol/L — ABNORMAL HIGH (ref 0.0–2.0)
Bicarbonate: 45.3 mmol/L — ABNORMAL HIGH (ref 20.0–28.0)
Drawn by: 331471
FIO2: 98
O2 Saturation: 75.5 %
Patient temperature: 98.6
pCO2 arterial: 84.3 mmHg (ref 32.0–48.0)
pH, Arterial: 7.35 (ref 7.350–7.450)
pO2, Arterial: 41.3 mmHg — ABNORMAL LOW (ref 83.0–108.0)

## 2020-09-24 LAB — GLUCOSE, CAPILLARY
Glucose-Capillary: 240 mg/dL — ABNORMAL HIGH (ref 70–99)
Glucose-Capillary: 273 mg/dL — ABNORMAL HIGH (ref 70–99)
Glucose-Capillary: 299 mg/dL — ABNORMAL HIGH (ref 70–99)
Glucose-Capillary: 311 mg/dL — ABNORMAL HIGH (ref 70–99)
Glucose-Capillary: 331 mg/dL — ABNORMAL HIGH (ref 70–99)

## 2020-09-24 LAB — MAGNESIUM: Magnesium: 2.1 mg/dL (ref 1.7–2.4)

## 2020-09-24 LAB — MRSA PCR SCREENING: MRSA by PCR: NEGATIVE

## 2020-09-24 LAB — PHOSPHORUS: Phosphorus: 2.3 mg/dL — ABNORMAL LOW (ref 2.5–4.6)

## 2020-09-24 LAB — ERYTHROPOIETIN: Erythropoietin: 8.4 m[IU]/mL (ref 2.6–18.5)

## 2020-09-24 LAB — LACTATE DEHYDROGENASE: LDH: 169 U/L (ref 98–192)

## 2020-09-24 MED ORDER — INSULIN GLARGINE 100 UNIT/ML ~~LOC~~ SOLN
5.0000 [IU] | Freq: Every day | SUBCUTANEOUS | Status: DC
  Administered 2020-09-24 – 2020-09-25 (×2): 5 [IU] via SUBCUTANEOUS
  Filled 2020-09-24 (×2): qty 0.05

## 2020-09-24 MED ORDER — ACETYLCYSTEINE 20 % IN SOLN
RESPIRATORY_TRACT | Status: AC
  Filled 2020-09-24: qty 4

## 2020-09-24 MED ORDER — CHLORHEXIDINE GLUCONATE CLOTH 2 % EX PADS
6.0000 | MEDICATED_PAD | Freq: Every day | CUTANEOUS | Status: DC
  Administered 2020-09-24 – 2020-10-18 (×26): 6 via TOPICAL

## 2020-09-24 MED ORDER — INSULIN ASPART 100 UNIT/ML IJ SOLN
0.0000 [IU] | INTRAMUSCULAR | Status: DC
  Administered 2020-09-24 – 2020-09-25 (×3): 7 [IU] via SUBCUTANEOUS

## 2020-09-24 MED ORDER — ACETYLCYSTEINE 20 % IN SOLN
4.0000 mL | RESPIRATORY_TRACT | Status: AC
  Administered 2020-09-24 – 2020-09-25 (×7): 4 mL via RESPIRATORY_TRACT
  Filled 2020-09-24 (×8): qty 4

## 2020-09-24 MED ORDER — ORAL CARE MOUTH RINSE
15.0000 mL | Freq: Two times a day (BID) | OROMUCOSAL | Status: DC
  Administered 2020-09-24 – 2020-09-25 (×3): 15 mL via OROMUCOSAL

## 2020-09-24 MED ORDER — SODIUM PHOSPHATES 45 MMOLE/15ML IV SOLN
10.0000 mmol | Freq: Once | INTRAVENOUS | Status: AC
  Administered 2020-09-24: 10 mmol via INTRAVENOUS
  Filled 2020-09-24: qty 3.33

## 2020-09-24 NOTE — Progress Notes (Signed)
eLink Physician-Brief Progress Note Patient Name: JEOFFREY ELEAZER DOB: [birth year] MRN: [id number]   Date of Service  09/24/2020  HPI/Events of Note  Hyperglycemia - Blood glucose = 331. Patient on tube feeds + PO diet. Currently on AS/HS Novolog SSI.  eICU Interventions  Plan: Will change to Q 4 hour sensitive Novolog SSI.     Intervention Category Major Interventions: Hyperglycemia - active titration of insulin therapy  [name] 09/24/2020, 10:06 PM

## 2020-09-24 NOTE — Progress Notes (Signed)
Lower extremity venous bilateral study completed.   Please see CV Proc for preliminary results.   Allayna Erlich, RDMS, RVT  

## 2020-09-24 NOTE — Consult Note (Signed)
NAME:  Steve [name], MRN:  [id number], DOB:  [birth year], LOS: 5 ADMISSION DATE:  09/19/2020, CONSULTATION DATE:  09/24/20 REFERRING MD:  Dr Nolberto Hanlon, CHIEF COMPLAINT:  Resp failure   History of Present Illness:   65 year old male who is well kwn to PCCM service from [place] 2021 for s/p covid, respiratoy failure, ARDS, pneumothorax, prolonged mechanical ventilation (? 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  At baseline.   Port-A-Cath placement in December 2020 and gastrostomy tube placement in May 2021.  Of note , has Hodgkins Lymphoma  with recuirrence March 2022 (Bulky intrathoracic adenopathy esp new substernal and newosseous metastatic disease, PET 06/19/2020.). As of April 2022 got 2nd dose Adcetris.  The admitted May 2022 for "pneumonia" and Acetris help (in part due to high LFT) and all Rx held.  Also as a preadmission problem: Chronic caxhexia, protein calorie malnutrition - moderate, failure thrive, DM all as his baseline prior to admission problems/ his baseline ECOG May 2022 was 2  Admitted 09/19/20 (recent baseline 8LO2 via trach). With worsening cough, lethargy.  Blood culture came back positive with Steve [name] on 09/22/20 (all 4 of 4 cutuirs), infectious disease and s/p Port-A-Cath removal 09/22/20. As a present on admission problem O 09/22/20 - was needed 10L O2. CT scan this admit 09/20/2020 shwoed worrsening lymphadenopathy in chest concerning for Hodgkins progression. On 6/15 - Seen by Dr Marin Olp and recommended ICE protocol when better (highly immune suppressive regimen). ID had recommended 2 week Rx  . On 6/16 - reports he is more hypoxemic needeing >90% fio2 via Trach collar though no increased work of breathing. CCM consulted  Remains full code despite multiple conversatino  Pertinent  Medical History  Covid admission March - May 2021    has a past medical history of Abscess of muscle (08/10/2011), Acute on chronic respiratory failure with hypoxia ([place]), Acute respiratory distress syndrome (ARDS) due to COVID-19 virus ([place]), Anxiety, Diabetes mellitus without complication ([place]), GERD (gastroesophageal reflux disease), HCAP (healthcare-associated pneumonia), Hip dysplasia, congenital, Hodgkin lymphoma ([place]), Hypertension, Pancreatitis, Pneumothorax, acute, Postoperative anemia due to acute blood loss (09/07/2012), Septic arthritis of hip ([place]) (09/05/2012), and Sleep apnea.   reports that he has never smoked. He has never used smokeless tobacco.  Past Surgical History:  Procedure Laterality Date   COLONOSCOPY  04/26/2007   HERNIA REPAIR     inguinal hernia x3   IR GASTROSTOMY TUBE MOD SED  08/22/2019   IR IMAGING GUIDED PORT INSERTION  03/29/2019   IR REMOVAL TUN ACCESS W/ PORT W/O FL MOD SED  09/22/2020   JOINT REPLACEMENT  2002 & 2007   bilateral hip replacement   LYMPH NODE BIOPSY Right 03/20/2019   Procedure: DEEP RIGHT SUPRACLAVICULAR LYMPH NODE EXCISION;  Surgeon: Fanny Skates, MD;  Location: [place];  Service: General;  Laterality: Right;   MULTIPLE EXTRACTIONS WITH ALVEOLOPLASTY  07/28/2011   Procedure: MULTIPLE EXTRACION WITH ALVEOLOPLASTY;  Surgeon: Lenn Cal, DDS;  Location: [facility];  Service: Oral Surgery;  Laterality: N/A;  Extraction of tooth #'s 2,3,4,5,6,11,12,13,15,19,22 with alveoloplasty.   shoulder repair - right for separation of shoulder     TEE WITHOUT CARDIOVERSION  07/29/2011   Procedure: TRANSESOPHAGEAL ECHOCARDIOGRAM (TEE);  Surgeon: Josue Hector, MD;  Location: [place];  Service: Cardiovascular;  Laterality: N/A;   TOTAL HIP REVISION Right 09/05/2012   Procedure: RIGHT HIP RESECTION ARTHROPLASTY WITH ANTIBIOTIC  SPACERS;  Surgeon: [name], MD;  Location: [facility];  Service: Orthopedics;  Laterality: Right;   TOTAL HIP REVISION Right 11/30/2012   Procedure: RIGHT TOTAL HIP  ARTHROPLASTY REIMPLANTATION;  Surgeon: [name], MD;  Location: [facility];  Service: Orthopedics;  Laterality: Right;    No Known Allergies  Immunization History  Administered Date(s) Administered   Influenza,inj,Quad PF,6+ Mos 01/23/2017, 02/08/2019, 05/27/2020   PFIZER(Purple Top)SARS-COV-2 Vaccination 04/01/2020, 08/06/2020   Pneumococcal Polysaccharide-23 05/27/2020   Td 05/18/2004   Tdap 11/10/2016    Family History  Problem Relation Age of Onset   Melanoma Mother    Heart attack Mother    Hyperlipidemia Neg Hx    Sudden death Neg Hx    Hypertension Neg Hx    Diabetes Neg Hx      Current Facility-Administered Medications:    albuterol (PROVENTIL) (2.5 MG/3ML) 0.083% nebulizer solution 2.5 mg, 2.5 mg, Nebulization, Q2H PRN, Dana Allan I, MD   Chlorhexidine Gluconate Cloth 2 % PADS 6 each, 6 each, Topical, Daily, Dana Allan I, MD, 6 each at 09/23/20 1023   cholecalciferol (VITAMIN D) tablet 10,000 Units, 10,000 Units, Oral, Daily, Ogbata, Sylvester I, MD   enoxaparin (LOVENOX) injection 40 mg, 40 mg, Subcutaneous, Q24H, Ogbata, Sylvester I, MD, 40 mg at 09/23/20 2109   feeding supplement (OSMOLITE 1.2 CAL) liquid 1,000 mL, 1,000 mL, Per Tube, Continuous, Dana Allan I, MD, Last Rate: 65 mL/hr at 09/21/20 2322, 1,000 mL at 09/23/20 1822   feeding supplement (PROSource TF) liquid 45 mL, 45 mL, Per Tube, BID, Wofford, Drew A, RPH, 45 mL at 09/23/20 2120   guaiFENesin (ROBITUSSIN) 100 MG/5ML solution 100 mg, 5 mL, Per Tube, Q4H PRN, Dana Allan I, MD   hydrocortisone sodium succinate (SOLU-CORTEF) 100 MG injection 50 mg, 50 mg, Intravenous, Q8H, Ogbata, Sylvester I, MD, 50 mg at 09/24/20 6010   hyoscyamine (LEVSIN) 0.125 MG/ML solution 0.125 mg, 0.125 mg, Oral, Q4H PRN, Dana Allan I, MD   insulin aspart (novoLOG) injection 0-5 Units, 0-5 Units, Subcutaneous, QHS, Dana Allan I, MD, 3 Units at 09/23/20 2226   insulin aspart (novoLOG)  injection 0-9 Units, 0-9 Units, Subcutaneous, TID WC, Dana Allan I, MD, 5 Units at 09/24/20 0843   insulin glargine (LANTUS) injection 5 Units, 5 Units, Subcutaneous, Daily, Kurtis Bushman, Sahar, MD   LORazepam (ATIVAN) tablet 0.5 mg, 0.5 mg, Oral, Q6H PRN, Dana Allan I, MD, 0.5 mg at 09/23/20 2108   midodrine (PROAMATINE) tablet 5 mg, 5 mg, Oral, TID WC, Dana Allan I, MD, 5 mg at 09/24/20 0844   multivitamin liquid 15 mL, 15 mL, Per Tube, Daily, Dana Allan I, MD, 15 mL at 09/23/20 1012   pantoprazole sodium (PROTONIX) 40 mg/20 mL oral suspension 40 mg, 40 mg, Per Tube, Daily, Dana Allan I, MD, 40 mg at 09/23/20 1016   sertraline (ZOLOFT) tablet 50 mg, 50 mg, Oral, Daily, Dana Allan I, MD, 50 mg at 09/23/20 1010   traZODone (DESYREL) tablet 100 mg, 100 mg, Per Tube, QHS, Dana Allan I, MD, 100 mg at 09/23/20 2108   vancomycin (VANCOREADY) IVPB 750 mg/150 mL, 750 mg, Intravenous, Q12H, Gadhia, Jigna M, RPH, Last Rate: 150 mL/hr at 09/24/20 0428, 750 mg at 09/24/20 0428   Significant Hospital Events: Including procedures, antibiotic start and stop dates in addition to other pertinent events     Interim History / Subjective:    6/16 - ccm consult  Objective   Blood pressure 115/65, pulse 96, temperature (!) 97.5  F (36.4 C), temperature source Oral, resp. rate (!) 30, weight 63.3 kg, SpO2 95 %.    FiO2 (%):  [80 %-98 %] 98 %   Intake/Output Summary (Last 24 hours) at 09/24/2020 0906 Last data filed at 09/24/2020 0500 Gross per 24 hour  Intake 391.75 ml  Output 700 ml  Net -308.25 ml   Filed Weights   09/19/20 1930  Weight: 63.3 kg    Examination: Markedly frail gentleman with tracheostomy chronic.  Has trach collar oxygen through the tracheostomy.  Initially in bed 05/28/2015 he appeared to be in somewhat of her respiratory distress but as he started talking to him his tachypnea settled down.  He had good strength.  He was protecting his  airway.  He was able to answer and talk full sentences through the tracheostomy.  He was clear to auscultation.  He does look profoundly debilitated.  Normal heart sounds somewhat tachycardic abdomen soft.  Labs/imaging that I havepersonally reviewed  (right click and "Reselect all SmartList Selections" daily)   LABS    PULMONARY No results for input(s): PHART, PCO2ART, PO2ART, HCO3, TCO2, O2SAT in the last 168 hours.  Invalid input(s): PCO2, PO2  CBC Recent Labs  Lab 09/22/20 0536 09/23/20 1026 09/24/20 0534  HGB 9.1* 9.9* 10.0*  HCT 29.8* 34.0* 35.3*  WBC 17.2* 19.2* 20.4*  PLT 237 236 218    COAGULATION No results for input(s): INR in the last 168 hours.  CARDIAC  No results for input(s): TROPONINI in the last 168 hours. No results for input(s): PROBNP in the last 168 hours.   CHEMISTRY Recent Labs  Lab 09/19/20 1511 09/20/20 0708 09/22/20 0536 09/23/20 1026 09/24/20 0534  NA 131* 135 138 140 141  K 4.4 4.7 4.1 4.7 4.7  CL 81* 87* 92* 94* 95*  CO2 40* 43* 43* 42* 41*  GLUCOSE 223* 254* 326* 317* 391*  BUN 23 25* 25* 23 24*  CREATININE 0.43* 0.55* 0.51* 0.51* 0.55*  CALCIUM 9.2 9.6 9.3 9.4 9.7  MG  --  1.9 2.1  --  2.1  PHOS  --   --  2.7  --  2.3*   Estimated Creatinine Clearance: 81.1 mL/min (A) (by C-G formula based on SCr of 0.55 mg/dL (L)).   LIVER Recent Labs  Lab 09/18/20 1212 09/19/20 1511 09/20/20 0708 09/23/20 1026 09/24/20 0534  AST 17 15 15  11* 12*  ALT 34 28 30 19 17   ALKPHOS 113 83 82 73 73  BILITOT 0.6 0.6 0.8 0.3 0.2*  PROT 6.5 6.3* 6.7 6.3* 6.4*  ALBUMIN 3.0* 2.3* 2.2* 2.1* 2.2*     INFECTIOUS Recent Labs  Lab 09/19/20 1511 09/20/20 0708  LATICACIDVEN 0.7  --   PROCALCITON  --  <0.10     ENDOCRINE CBG (last 3)  Recent Labs    09/23/20 1646 09/23/20 2212 09/24/20 0759  GLUCAP 289* 257* 299*         IMAGING x48h  - image(s) personally visualized  -   highlighted in bold IR REMOVAL TUN ACCESS W/ PORT W/O  FL MOD SED  Result Date: 09/22/2020 INDICATION: 65 year old male with a history of Hodgkin lymphoma currently presenting with bacteremia associated with pneumonia. He has a left chest port catheter which was placed by interventional radiology (Dr. Annamaria Boots) in December of 2020. he presents for port catheter removal to help facilitate clearance of bacteremia. EXAM: REMOVAL RIGHT IJ VEIN PORT-A-CATH MEDICATIONS: None ANESTHESIA/SEDATION: Moderate (conscious) sedation was employed during this procedure. A total of Versed  1 mg and Fentanyl 50 mcg was administered intravenously. Moderate Sedation Time: 12 minutes. The patient's level of consciousness and vital signs were monitored continuously by radiology nursing throughout the procedure under my direct supervision. FLUOROSCOPY TIME:  None. COMPLICATIONS: None immediate. PROCEDURE: Informed written consent was obtained from the patient after a thorough discussion of the procedural risks, benefits and alternatives. All questions were addressed. Maximal Sterile Barrier Technique was utilized including caps, mask, sterile gowns, sterile gloves, sterile drape, hand hygiene and skin antiseptic. A timeout was performed prior to the initiation of the procedure. The right chest was prepped and draped in a sterile fashion. Lidocaine was utilized for local anesthesia. An incision was made over the previously healed surgical incision. Utilizing blunt dissection, the port catheter and reservoir were removed from the underlying subcutaneous tissue in their entirety. Securing sutures were also removed. The pocket was irrigated with a copious amount of sterile normal saline. The wound was examined. No evidence of purulence. The pocket was closed with interrupted 3-0 Vicryl stitches. The subcutaneous tissue was closed with 3-0 Vicryl interrupted subcutaneous stitches. Dermabond was applied. IMPRESSION: Successful right IJ vein Port-A-Cath explant. Electronically Signed   By: Jacqulynn Cadet M.D.   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  - Post admit worsening respriatory failure with worsening hypoxemia but clinically work of breathing okay - new onset mild  low phos 09/24/20  - suspect mucus plugging  - rule out VTE  Plan  - mucomyst - tracheal suctioning  - cxr  - duplex Lower Extremity - abx as below  - cefeopime 6/15 - 6/15  - vanc 6/11 >> - abg - continue ATC - can connect to vent if nededed         Best practice (right click and "Reselect all SmartList Selections" daily)  Per triad     ATTESTATION & SIGNATURE   The patient Steve [name] is critically ill with multiple organ systems failure and requires high complexity decision making for assessment and support, frequent evaluation and titration of therapies, application of advanced monitoring technologies and extensive interpretation of multiple databases.   Critical Care Time devoted to patient care services described in this note is  35  Minutes. This time reflects time of care of this signee Dr Brand Males. This critical care time does not reflect procedure time, or teaching time or supervisory time of PA/NP/Med student/Med Resident etc but could involve care discussion time     Dr. Brand Males, M.D., [facility].C.P Pulmonary and Critical Care Medicine Staff Physician [facility] Pager: [phone], If no answer or between  15:00h - 7:00h: call [phone]  09/24/2020 9:06 AM    LABS    PULMONARY No results for input(s): PHART, PCO2ART, PO2ART, HCO3, TCO2, O2SAT in the  last 168 hours.  Invalid input(s): PCO2, PO2  CBC Recent Labs  Lab 09/22/20 0536 09/23/20 1026 09/24/20 0534  HGB 9.1* 9.9* 10.0*  HCT 29.8* 34.0* 35.3*  WBC 17.2* 19.2* 20.4*  PLT 237 236 218    COAGULATION No results for input(s): INR in the last 168  hours.  CARDIAC  No results for input(s): TROPONINI in the last 168 hours. No results for input(s): PROBNP in the last 168 hours.   CHEMISTRY Recent Labs  Lab 09/19/20 1511 09/20/20 0708 09/22/20 0536 09/23/20 1026 09/24/20 0534  NA 131* 135 138 140 141  K 4.4 4.7 4.1 4.7 4.7  CL 81* 87* 92* 94* 95*  CO2 40* 43* 43* 42* 41*  GLUCOSE 223* 254* 326* 317* 391*  BUN 23 25* 25* 23 24*  CREATININE 0.43* 0.55* 0.51* 0.51* 0.55*  CALCIUM 9.2 9.6 9.3 9.4 9.7  MG  --  1.9 2.1  --  2.1  PHOS  --   --  2.7  --  2.3*   Estimated Creatinine Clearance: 81.1 mL/min (A) (by C-G formula based on SCr of 0.55 mg/dL (L)).   LIVER Recent Labs  Lab 09/18/20 1212 09/19/20 1511 09/20/20 0708 09/23/20 1026 09/24/20 0534  AST 17 15 15  11* 12*  ALT 34 28 30 19 17   ALKPHOS 113 83 82 73 73  BILITOT 0.6 0.6 0.8 0.3 0.2*  PROT 6.5 6.3* 6.7 6.3* 6.4*  ALBUMIN 3.0* 2.3* 2.2* 2.1* 2.2*     INFECTIOUS Recent Labs  Lab 09/19/20 1511 09/20/20 0708  LATICACIDVEN 0.7  --   PROCALCITON  --  <0.10     ENDOCRINE CBG (last 3)  Recent Labs    09/23/20 1646 09/23/20 2212 09/24/20 0759  GLUCAP 289* 257* 299*         IMAGING x48h  - image(s) personally visualized  -   highlighted in bold IR REMOVAL TUN ACCESS W/ PORT W/O FL MOD SED  Result Date: 09/22/2020 INDICATION: 65 year old male with a history of Hodgkin lymphoma currently presenting with bacteremia associated with pneumonia. He has a left chest port catheter which was placed by interventional radiology (Dr. Annamaria Boots) in December of 2020. he presents for port catheter removal to help facilitate clearance of bacteremia. EXAM: REMOVAL RIGHT IJ VEIN PORT-A-CATH MEDICATIONS:  None ANESTHESIA/SEDATION: Moderate (conscious) sedation was employed during this procedure. A total of Versed 1 mg and Fentanyl 50 mcg was administered intravenously. Moderate Sedation Time: 12 minutes. The patient's level of consciousness and vital signs were monitored continuously by radiology nursing throughout the procedure under my direct supervision. FLUOROSCOPY TIME:  None. COMPLICATIONS: None immediate. PROCEDURE: Informed written consent was obtained from the patient after a thorough discussion of the procedural risks, benefits and alternatives. All questions were addressed. Maximal Sterile Barrier Technique was utilized including caps, mask, sterile gowns, sterile gloves, sterile drape, hand hygiene and skin antiseptic. A timeout was performed prior to the initiation of the procedure. The right chest was prepped and draped in a sterile fashion. Lidocaine was utilized for local anesthesia. An incision was made over the previously healed surgical incision. Utilizing blunt dissection, the port catheter and reservoir were removed from the underlying subcutaneous tissue in their entirety. Securing sutures were also removed. The pocket was irrigated with a copious amount of sterile normal saline. The wound was examined. No evidence of purulence. The pocket was closed with interrupted 3-0 Vicryl stitches. The subcutaneous tissue was closed with 3-0 Vicryl interrupted subcutaneous stitches. Dermabond was applied. IMPRESSION: Successful right IJ vein Port-A-Cath explant. Electronically Signed   By: Jacqulynn Cadet M.D.   On: 09/22/2020 16:18

## 2020-09-24 NOTE — Progress Notes (Signed)
PROGRESS NOTE    Steve [name]  VZC:[id number] DOB: December 27, 1955 DOA: 09/19/2020 PCP: [name], PA-C    Brief Narrative:  Patient is a 65 year old Caucasian  male with medical history significant of chronic tracheostomy after prolonged hospitalization in 2021 related with pneumonia due to JOINO-[id number] complicated by ARDS and pulmonary fibrosis +/-pneumonitis from brentuximab, s/p PEG, HTN, DM type II, Hodgkin's lymphoma on chemotherapy and GERD was recently discharged from our hospital on 08/25/2020.  For 3 weeks he had done good but 5 days prior to presentation patient started having poor appetite malaise and failure to thrive with increased concern for pneumonia.  Patient then presented to hospital.  Patient does have baseline x-ray abnormalities from previous COVID so it was difficult to see the change.  Patient was started empirically on vancomycin and cefepime and was admitted to hospital.  Oncology team was consulted as well.   09/24/2020 received a call from respiratory therapist and nursing that patient is requiring more oxygenation for his trach he is at the maximum.  He is mildly tachypneic.  Is full code and agreeable to intubation if it comes to that.  Patient being transferred to ICU   Consultants:  Hematology, ID  Procedures:   Antimicrobials:      Subjective: Pt lying in bed, reports being "ok." Denies cp. Unable to tell me if he is sob.   Objective: Vitals:   09/24/20 0231 09/24/20 0511 09/24/20 0740 09/24/20 0800  BP:  115/65    Pulse: 97 96    Resp: (!) 32 17  (!) 30  Temp:  (!) 97.5 F (36.4 C)    TempSrc:  Oral    SpO2: 91% 94% 95%   Weight:        Intake/Output Summary (Last 24 hours) at 09/24/2020 0848 Last data filed at 09/24/2020 0500 Gross per 24 hour  Intake 391.75 ml  Output 700 ml  Net -308.25 ml   Filed Weights   09/19/20 1930  Weight: 63.3 kg    Examination:  General exam: Appears calm and comfortable on trach collar Respiratory  system: rhonchorus, no wheezing Cardiovascular system: S1 & S2 heard, mildly tacyhycardic. No gallops Gastrointestinal system: Abdomen is nondistended, soft and nontender.Normal bowel sounds heard. Central nervous system: Alert and oriented. Grossly intact Extremities: no edema Psychiatry:  Mood & affect appropriate.     Data Reviewed: I have personally reviewed following labs and imaging studies  CBC: Recent Labs  Lab 09/18/20 1212 09/19/20 1511 09/20/20 1015 09/22/20 0536 09/23/20 1026 09/24/20 0534  WBC 18.6 Repeated and verified X2.* 19.0* 24.9* 17.2* 19.2* 20.4*  NEUTROABS 16.5* 15.1* 21.8*  --  16.0* 16.4*  HGB 10.8* 10.2* 10.2* 9.1* 9.9* 10.0*  HCT 33.9* 33.1* 33.3* 29.8* 34.0* 35.3*  MCV 90.6 93.2 93.5 97.1 99.7 102.0*  PLT 384.0 313 314 237 236 672   Basic Metabolic Panel: Recent Labs  Lab 09/19/20 1511 09/20/20 0708 09/22/20 0536 09/23/20 1026 09/24/20 0534  NA 131* 135 138 140 141  K 4.4 4.7 4.1 4.7 4.7  CL 81* 87* 92* 94* 95*  CO2 40* 43* 43* 42* 41*  GLUCOSE 223* 254* 326* 317* 391*  BUN 23 25* 25* 23 24*  CREATININE 0.43* 0.55* 0.51* 0.51* 0.55*  CALCIUM 9.2 9.6 9.3 9.4 9.7  MG  --  1.9 2.1  --  2.1  PHOS  --   --  2.7  --  2.3*   GFR: Estimated Creatinine Clearance: 81.1 mL/min (A) (by C-G formula based on  SCr of 0.55 mg/dL (L)). Liver Function Tests: Recent Labs  Lab 09/18/20 1212 09/19/20 1511 09/20/20 0708 09/23/20 1026 09/24/20 0534  AST 17 15 15  11* 12*  ALT 34 28 30 19 17   ALKPHOS 113 83 82 73 73  BILITOT 0.6 0.6 0.8 0.3 0.2*  PROT 6.5 6.3* 6.7 6.3* 6.4*  ALBUMIN 3.0* 2.3* 2.2* 2.1* 2.2*   No results for input(s): LIPASE, AMYLASE in the last 168 hours. No results for input(s): AMMONIA in the last 168 hours. Coagulation Profile: No results for input(s): INR, PROTIME in the last 168 hours. Cardiac Enzymes: Recent Labs  Lab 09/19/20 1511  CKTOTAL 11*   BNP (last 3 results) No results for input(s): PROBNP in the last 8760  hours. HbA1C: No results for input(s): HGBA1C in the last 72 hours. CBG: Recent Labs  Lab 09/23/20 0731 09/23/20 1147 09/23/20 1646 09/23/20 2212 09/24/20 0759  GLUCAP 281* 227* 289* 257* 299*   Lipid Profile: No results for input(s): CHOL, HDL, LDLCALC, TRIG, CHOLHDL, LDLDIRECT in the last 72 hours. Thyroid Function Tests: No results for input(s): TSH, T4TOTAL, FREET4, T3FREE, THYROIDAB in the last 72 hours. Anemia Panel: Recent Labs    09/23/20 1026  TIBC 106*  IRON 17*   Sepsis Labs: Recent Labs  Lab 09/19/20 1511 09/20/20 0708  PROCALCITON  --  <0.10  LATICACIDVEN 0.7  --     Recent Results (from the past 240 hour(s))  Urine culture     Status: Abnormal   Collection Time: 09/19/20  3:11 PM   Specimen: Urine, Clean Catch  Result Value Ref Range Status   Specimen Description   Final    URINE, CLEAN CATCH Performed at [facility], [place] [address], [place], [place] [address]    Special Requests   Final    NONE Performed at [facility], [place] [address], [place], [place] [address]    Culture MULTIPLE SPECIES PRESENT, SUGGEST RECOLLECTION (A)  Final   Report Status 09/21/2020 FINAL  Final  Culture, blood (routine x 2)     Status: Abnormal   Collection Time: 09/19/20  3:11 PM   Specimen: BLOOD  Result Value Ref Range Status   Specimen Description   Final    BLOOD PORTA CATH Performed at [place] [address] [place], [place] [address]    Special Requests   Final    BOTTLES DRAWN AEROBIC AND ANAEROBIC Blood Culture results may not be optimal due to an excessive volume of blood received in culture bottles Performed at [place] [address], [place], [place] [address]    Culture  Setup Time   Final    GRAM POSITIVE COCCI IN BOTH AEROBIC AND ANAEROBIC BOTTLES CRITICAL RESULT CALLED TO, READ BACK BY AND VERIFIED WITH: M,SWAYNE PHARMD @1552  09/20/20 EB Performed at [facility] [address], [place], [place] [address]    Culture STAPHYLOCOCCUS EPIDERMIDIS (A)  Final   Report Status 09/22/2020 FINAL  Final   Organism ID, Bacteria STAPHYLOCOCCUS EPIDERMIDIS  Final      Susceptibility   Staphylococcus epidermidis - MIC*    CIPROFLOXACIN >=8 RESISTANT Resistant     ERYTHROMYCIN >=8 RESISTANT Resistant     GENTAMICIN 8 INTERMEDIATE Intermediate     OXACILLIN >=4 RESISTANT Resistant     TETRACYCLINE >=16 RESISTANT Resistant     VANCOMYCIN 1 SENSITIVE Sensitive     TRIMETH/SULFA 80 RESISTANT Resistant     CLINDAMYCIN >=8 RESISTANT  Resistant     RIFAMPIN <=0.5 SENSITIVE Sensitive     Inducible Clindamycin NEGATIVE Sensitive     * STAPHYLOCOCCUS EPIDERMIDIS  Blood Culture ID Panel (Reflexed)     Status: Abnormal   Collection Time: 09/19/20  3:11 PM  Result Value Ref Range Status   Enterococcus faecalis NOT DETECTED NOT DETECTED Final   Enterococcus Faecium NOT DETECTED NOT DETECTED Final   Listeria monocytogenes NOT DETECTED NOT DETECTED Final   Staphylococcus species DETECTED (A) NOT DETECTED Final    Comment: CRITICAL RESULT CALLED TO, READ BACK BY AND VERIFIED WITH: M,SWAYNE PHARMD @1552  09/20/20 EB    Staphylococcus aureus (BCID) NOT DETECTED NOT DETECTED Final   Staphylococcus epidermidis DETECTED (A) NOT DETECTED Final    Comment: Methicillin (oxacillin) resistant coagulase negative staphylococcus. Possible blood culture contaminant (unless isolated from more than one blood culture draw or clinical case suggests pathogenicity). No antibiotic treatment is indicated for blood  culture contaminants. CRITICAL RESULT CALLED TO, READ BACK BY AND VERIFIED WITH: M,SWAYNE PHARMD @1552  09/20/20 EB    Staphylococcus lugdunensis NOT DETECTED NOT DETECTED Final   Streptococcus species NOT DETECTED NOT DETECTED Final   Streptococcus agalactiae NOT DETECTED NOT DETECTED Final   Streptococcus pneumoniae NOT DETECTED NOT DETECTED Final   Streptococcus  pyogenes NOT DETECTED NOT DETECTED Final   A.calcoaceticus-baumannii NOT DETECTED NOT DETECTED Final   Bacteroides fragilis NOT DETECTED NOT DETECTED Final   Enterobacterales NOT DETECTED NOT DETECTED Final   Enterobacter cloacae complex NOT DETECTED NOT DETECTED Final   Escherichia coli NOT DETECTED NOT DETECTED Final   Klebsiella aerogenes NOT DETECTED NOT DETECTED Final   Klebsiella oxytoca NOT DETECTED NOT DETECTED Final   Klebsiella pneumoniae NOT DETECTED NOT DETECTED Final   Proteus species NOT DETECTED NOT DETECTED Final   Salmonella species NOT DETECTED NOT DETECTED Final   Serratia marcescens NOT DETECTED NOT DETECTED Final   Haemophilus influenzae NOT DETECTED NOT DETECTED Final   Neisseria meningitidis NOT DETECTED NOT DETECTED Final   Pseudomonas aeruginosa NOT DETECTED NOT DETECTED Final   Stenotrophomonas maltophilia NOT DETECTED NOT DETECTED Final   Candida albicans NOT DETECTED NOT DETECTED Final   Candida auris NOT DETECTED NOT DETECTED Final   Candida glabrata NOT DETECTED NOT DETECTED Final   Candida krusei NOT DETECTED NOT DETECTED Final   Candida parapsilosis NOT DETECTED NOT DETECTED Final   Candida tropicalis NOT DETECTED NOT DETECTED Final   Cryptococcus neoformans/gattii NOT DETECTED NOT DETECTED Final   Methicillin resistance mecA/C DETECTED (A) NOT DETECTED Final    Comment: CRITICAL RESULT CALLED TO, READ BACK BY AND VERIFIED WITH: M,SWAYNE PHARMD @1552  09/20/20 EB Performed at [facility], [address], [place], [place] [address]   Culture, blood (routine x 2)     Status: Abnormal   Collection Time: 09/19/20  3:16 PM   Specimen: BLOOD  Result Value Ref Range Status   Specimen Description   Final    BLOOD SITE NOT SPECIFIED Performed at [facility] [address] [place], [place] [address]    Special Requests   Final    BOTTLES DRAWN AEROBIC AND ANAEROBIC Blood Culture adequate volume Performed at [place] [address], [place], [place] [address]    Culture  Setup Time   Final    GRAM POSITIVE COCCI IN BOTH AEROBIC AND ANAEROBIC BOTTLES CRITICAL VALUE NOTED.  VALUE IS CONSISTENT WITH PREVIOUSLY REPORTED AND CALLED VALUE.    Culture (A)  Final  STAPHYLOCOCCUS EPIDERMIDIS SUSCEPTIBILITIES PERFORMED ON PREVIOUS CULTURE WITHIN THE LAST 5 DAYS. Performed at [facility], [place] [address] [place], [place] [address]    Report Status 09/22/2020 FINAL  Final  Resp Panel by RT-PCR (Flu A&B, Covid) Nasopharyngeal Swab     Status: None   Collection Time: 09/19/20  3:49 PM   Specimen: Nasopharyngeal Swab; Nasopharyngeal(NP) swabs in vial transport medium  Result Value Ref Range Status   SARS Coronavirus 2 by RT PCR NEGATIVE NEGATIVE Final    Comment: (NOTE) SARS-CoV-2 target nucleic acids are NOT DETECTED.  The SARS-CoV-2 RNA is generally detectable in upper respiratory specimens during the acute phase of infection. The lowest concentration of SARS-CoV-2 viral copies this assay can detect is 138 copies/mL. A negative result does not preclude SARS-Cov-2 infection and should not be used as the sole basis for treatment or other patient management decisions. A negative result may occur with  improper specimen collection/handling, submission of specimen other than nasopharyngeal swab, presence of viral mutation(s) within the areas targeted by this assay, and inadequate number of viral copies(<138 copies/mL). A negative result must be combined with clinical observations, patient history, and epidemiological information. The expected result is Negative.  Fact Sheet for Patients:  EntrepreneurPulse.com.au  Fact Sheet for Healthcare Providers:  IncredibleEmployment.be  This test is no t yet approved or cleared by the [place] FDA and  has been authorized for detection and/or diagnosis of SARS-CoV-2 by FDA under an Emergency Use  Authorization (EUA). This EUA will remain  in effect (meaning this test can be used) for the duration of the COVID-19 declaration under Section 564(b)(1) of the Act, 21 U.S.C.section 360bbb-3(b)(1), unless the authorization is terminated  or revoked sooner.       Influenza A by PCR NEGATIVE NEGATIVE Final   Influenza B by PCR NEGATIVE NEGATIVE Final    Comment: (NOTE) The Xpert Xpress SARS-CoV-2/FLU/RSV plus assay is intended as an aid in the diagnosis of influenza from Nasopharyngeal swab specimens and should not be used as a sole basis for treatment. Nasal washings and aspirates are unacceptable for Xpert Xpress SARS-CoV-2/FLU/RSV testing.  Fact Sheet for Patients: EntrepreneurPulse.com.au  Fact Sheet for Healthcare Providers: IncredibleEmployment.be  This test is not yet approved or cleared by the [place] FDA and has been authorized for detection and/or diagnosis of SARS-CoV-2 by FDA under an Emergency Use Authorization (EUA). This EUA will remain in effect (meaning this test can be used) for the duration of the COVID-19 declaration under Section 564(b)(1) of the Act, 21 U.S.C. section 360bbb-3(b)(1), unless the authorization is terminated or revoked.  Performed at [facility], [place] [address] [place], [place] [address]   MRSA PCR Screening     Status: None   Collection Time: 09/20/20  6:50 AM   Specimen: Nasopharyngeal  Result Value Ref Range Status   MRSA by PCR NEGATIVE NEGATIVE Final    Comment:        The GeneXpert MRSA Assay (FDA approved for NASAL specimens only), is one component of a comprehensive MRSA colonization surveillance program. It is not intended to diagnose MRSA infection nor to guide or monitor treatment for MRSA infections. Performed at [facility], [place] [address], [place], [place] [address]   Culture, blood (Routine X 2) w Reflex to ID Panel     Status: None  (Preliminary result)   Collection Time: 09/22/20  7:06 PM   Specimen: BLOOD  Result Value Ref Range Status   Specimen Description  Final    BLOOD RIGHT ANTECUBITAL Performed at [place] [address] [place], [place] [address]    Special Requests   Final    BOTTLES DRAWN AEROBIC AND ANAEROBIC Blood Culture adequate volume Performed at [place] [address], [place], [place] [address]    Culture   Final    NO GROWTH 2 DAYS Performed at [place] [address] [place], [place] [address]    Report Status PENDING  Incomplete  Culture, blood (Routine X 2) w Reflex to ID Panel     Status: None (Preliminary result)   Collection Time: 09/22/20  7:06 PM   Specimen: BLOOD RIGHT HAND  Result Value Ref Range Status   Specimen Description   Final    BLOOD RIGHT HAND Performed at [place] [address], [place], [place] [address]    Special Requests   Final    BOTTLES DRAWN AEROBIC ONLY Blood Culture adequate volume Performed at [facility] [address] [place], [place] [address]    Culture   Final    NO GROWTH 2 DAYS Performed at [place] [address] [place], [place] [address]    Report Status PENDING  Incomplete         Radiology Studies: IR REMOVAL TUN ACCESS W/ PORT W/O FL MOD SED  Result Date: 09/22/2020 INDICATION: 65 year old male with a history of Hodgkin lymphoma currently presenting with bacteremia associated with pneumonia. He has a left chest port catheter which was placed by interventional radiology (Dr. Annamaria Boots) in December of 2020. he presents for port catheter removal to help facilitate clearance of bacteremia. EXAM: REMOVAL RIGHT IJ VEIN PORT-A-CATH MEDICATIONS: None ANESTHESIA/SEDATION: Moderate (conscious) sedation was employed during this procedure. A total of Versed 1 mg and Fentanyl 50 mcg was administered intravenously. Moderate Sedation  Time: 12 minutes. The patient's level of consciousness and vital signs were monitored continuously by radiology nursing throughout the procedure under my direct supervision. FLUOROSCOPY TIME:  None. COMPLICATIONS: None immediate. PROCEDURE: Informed written consent was obtained from the patient after a thorough discussion of the procedural risks, benefits and alternatives. All questions were addressed. Maximal Sterile Barrier Technique was utilized including caps, mask, sterile gowns, sterile gloves, sterile drape, hand hygiene and skin antiseptic. A timeout was performed prior to the initiation of the procedure. The right chest was prepped and draped in a sterile fashion. Lidocaine was utilized for local anesthesia. An incision was made over the previously healed surgical incision. Utilizing blunt dissection, the port catheter and reservoir were removed from the underlying subcutaneous tissue in their entirety. Securing sutures were also removed. The pocket was irrigated with a copious amount of sterile normal saline. The wound was examined. No evidence of purulence. The pocket was closed with interrupted 3-0 Vicryl stitches. The subcutaneous tissue was closed with 3-0 Vicryl interrupted subcutaneous stitches. Dermabond was applied. IMPRESSION: Successful right IJ vein Port-A-Cath explant. Electronically Signed   By: Jacqulynn Cadet M.D.   On: 09/22/2020 16:18        Scheduled Meds:  Chlorhexidine Gluconate Cloth  6 each Topical Daily   cholecalciferol  10,000 Units Oral Daily   enoxaparin (LOVENOX) injection  40 mg Subcutaneous Q24H   feeding supplement (PROSource TF)  45 mL Per Tube BID   hydrocortisone sod succinate (SOLU-CORTEF) inj  50 mg Intravenous Q8H   insulin aspart  0-5 Units Subcutaneous QHS   insulin aspart  0-9 Units  Subcutaneous TID WC   midodrine  5 mg Oral TID WC   multivitamin  15 mL Per Tube Daily   pantoprazole sodium  40 mg Per Tube Daily   sertraline  50 mg Oral Daily    traZODone  100 mg Per Tube QHS   Continuous Infusions:  feeding supplement (OSMOLITE 1.2 CAL) 1,000 mL (09/21/20 2322)   vancomycin 750 mg (09/24/20 0428)    Assessment & Plan:   Principal Problem:   Failure to thrive in adult Active Problems:   Tracheostomy dependence ([place])   Chronic respiratory failure with hypoxia ([name])   Hodgkin's lymphoma ([place])   Essential hypertension   Bacteremia   Possible pneumonia: Patient has history of chronic respiratory failure on 8 L of oxygen at baseline status post tracheostomy.  History of recurrent pneumonia with prior history of COVID and pulmonary fibrosis.  Patient was on prolonged ventilation for almost 8 months last year.  Chest x-ray this time shows possible recurrent pneumonia but difficult to interpret.   CT scan of the chest was performed which showed interval increase in bulk of adenopathy in the metastasis consistent with known lymphoma with increased axillary lymph nodes.  Unchanged left pleural fluid and fibrosis.  Leukocytosis has slightly trended down.  Seen by speech therapy and has mild aspiration risk.  Recommended regular solids with thin liquids.   6/16 -patient was being treated with cefepime and vancomycin.  ID on board and discontinued cefepime.   He is requiring the maximum oxygen with his current trach collar.  He is mildly tachypneic and tachycardic.  We will transfer him to ICU for higher level of care in case he will need to be reintubated.   PCCM was consulted and they are aware of patient's transfer to ICU      Staph epidermidis bacteremia.  Both bottles are positive.  Likely true bacteremia.   6/16 status post Port-A-Cath removal.   IDs input was appreciated.  Cefepime was discontinued as above.  Continue vancomycin.   ID recommends minimum of 2 weeks treatment.   Recommends to obtain TEE to ensure no endocarditis.  ...>coordinate with pccm when in ICU about timing of obtaing TEE.  We will follow-up on blood cultures  to ensure it has cleared     Chronic respiratory failure/pulmonary fibrosis: -History of COVID, intubation for very long time (8 months) and tracheostomy. Patient uses 8 L of oxygen at home with tracheostomy collar.  Currently needing 10 L/min saturating 96%.. 6/16-currently on maximum O2 setting satting between high 80s to low 90s per respiratory therapist.  Will transfer to ICU for higher level of care in case he will need to be intubated. PCCM today was consulted they are aware.     Hodgkin's lymphoma: CT scan concerning for worsening lymphadenopathy and progression.  Oncology on board.  6/16 will need PICC placement, on hold since patient is being transferred to ICU.  Also make sure okay with ID prior to PICC placement Oncology may consider ICE protocol.  They would like to have some type of access Central for chemo i.e. PICC placement   Diabetes mellitus: BG elevated. On steroids Will add lantus 5 U qd [place] Diabetic educator consult   Failure to thrive: With significant anorexia from advanced lymphoma Patient has Hodgkin's lymphoma progressive malignancy Oncology on board     Severe protein calorie malnutrition: Present on admission.  Nutritional supplements as per dietary services     Hyponatremia: Mild.  Possible SIADH.  Continue to monitor.  DVT prophylaxis: Lovenox Code Status: Full Family Communication: Wife updated about patient being transferred to ICU and other issues Disposition Plan:  Status is: Inpatient  Remains inpatient appropriate because:Inpatient level of care appropriate due to severity of illness  Dispo: The patient is from: Home              Anticipated d/c is to: Home              Patient currently is not medically stable to d/c.   Difficult to place patient No   Will transfer pt to ICU for higher level of care as he is requiring max 02 on trach currently.          LOS: 5 days   Time spent: 45 minutes with more than 50% on  [place], MD [facility] Pager [phone]  If 7PM-7AM, please contact night-coverage 09/24/2020, 8:48 AM

## 2020-09-24 NOTE — Progress Notes (Signed)
Report given to [facility] In ICU

## 2020-09-24 NOTE — Progress Notes (Signed)
[facility] for Infectious Disease    Date of Admission:  09/19/2020   Total days of antibiotics 6/ vanco (finished 5 days of cefepime)           ID: Steve [name] is a 65 y.o. male with  staph epi bacteremia with port a cath in place Principal Problem:   Failure to thrive in adult Active Problems:   Tracheostomy dependence ([place])   Chronic respiratory failure with hypoxia ([place])   Hodgkin's lymphoma ([place])   Essential hypertension   Bacteremia    Subjective: Afebrile, but has had increase oxygen requirement thus transferred to ICU for PCCM management. +sob.  Medications:   acetylcysteine  4 mL Nebulization Q4H   acetylcysteine       Chlorhexidine Gluconate Cloth  6 each Topical Q0600   cholecalciferol  10,000 Units Oral Daily   enoxaparin (LOVENOX) injection  40 mg Subcutaneous Q24H   feeding supplement (PROSource TF)  45 mL Per Tube BID   hydrocortisone sod succinate (SOLU-CORTEF) inj  50 mg Intravenous Q8H   insulin aspart  0-5 Units Subcutaneous QHS   insulin aspart  0-9 Units Subcutaneous TID WC   insulin glargine  5 Units Subcutaneous Daily   mouth rinse  15 mL Mouth Rinse BID   midodrine  5 mg Oral TID WC   multivitamin  15 mL Per Tube Daily   pantoprazole sodium  40 mg Per Tube Daily   sertraline  50 mg Oral Daily   traZODone  100 mg Per Tube QHS    Objective: Vital signs in last 24 hours: Temp:  [97.5 F (36.4 C)-97.9 F (36.6 C)] 97.7 F (36.5 C) (06/16 1100) Pulse Rate:  [83-97] 91 (06/16 1200) Resp:  [17-41] 41 (06/16 1200) BP: (100-119)/(57-65) 119/62 (06/16 1200) SpO2:  [86 %-99 %] 96 % (06/16 1548) FiO2 (%):  [80 %-98 %] 98 % (06/16 1548) Physical Exam  Constitutional: He is oriented to person, place, and time. He appears chronically ill and under-nourished. No distress.  HENT:  Mouth/Throat: Oropharynx is clear and moist. No oropharyngeal exudate.  Neck: trach in place Cardiovascular: Normal rate, regular rhythm and normal heart sounds.  Exam reveals no gallop and no friction rub.  No murmur heard.  Pulmonary/Chest: Effort normal and breath sounds normal. No respiratory distress. He has no wheezes.  Abdominal: Soft. Bowel sounds are normal. He exhibits no distension. There is no tenderness.  Chest wall = bandaged Neurological: He is alert and oriented to person, place, and time.  Skin: Skin is warm and dry. No rash noted. No erythema.  Psychiatric: He has a normal mood and affect. His behavior is normal.    Lab Results Recent Labs    09/23/20 1026 09/24/20 0534  WBC 19.2* 20.4*  HGB 9.9* 10.0*  HCT 34.0* 35.3*  NA 140 141  K 4.7 4.7  CL 94* 95*  CO2 42* 41*  BUN 23 24*  CREATININE 0.51* 0.55*   Liver Panel Recent Labs    09/23/20 1026 09/24/20 0534  PROT 6.3* 6.4*  ALBUMIN 2.1* 2.2*  AST 11* 12*  ALT 19 17  ALKPHOS 73 73  BILITOT 0.3 0.2*     Microbiology: 6/14 blood cx ngtd - at 36hr Studies/Results: DG CHEST PORT 1 VIEW  Result Date: 09/24/2020 CLINICAL DATA:  Respiratory distress EXAM: PORTABLE CHEST 1 VIEW COMPARISON:  09/19/2020 FINDINGS: Lung volumes are small with elevation of the right hemidiaphragm, stable since prior examination. Superimposed multifocal pulmonary infiltrates likely related  to chronic underlying interstitial lung disease appears stable, better assessed on CT examination of 09/20/2020. Superimposed right pleural thickening is again noted. Tracheostomy unchanged. Cardiac size is enlarged with associated superior mediastinal widening again noted related to extensive mediastinal and pericardial adenopathy better demonstrated on prior CT examination. No acute bone abnormality. IMPRESSION: Stable diffuse pulmonary infiltrates likely related to chronic underlying interstitial lung disease and asymmetric right pleural thickening. Stable enlargement of the cardiomediastinal silhouette related to extensive mediastinal and pericardial adenopathy better appreciated on prior CT examination.  Electronically Signed   By: Fidela Salisbury MD   On: 09/24/2020 11:41   VAS [place] LOWER EXTREMITY VENOUS (DVT)  Result Date: 09/24/2020  Lower Venous DVT Study Patient Name:  Steve [name] [facility]  Date of Exam:   09/24/2020 Medical Rec #: [id number]          Accession #:    [id number] Date of Birth: 26-May-1955           Patient Gender: M Patient Age:   64Y Exam Location:  [facility] Procedure:      VAS [place] LOWER EXTREMITY VENOUS (DVT) Referring Phys: [id number] MURALI RAMASWAMY --------------------------------------------------------------------------------  Indications: Post-Covid severe pulmonary fibrosis, relapsed Hodkin's lymphoma, edema.  Comparison       07-09-2019 Bilateral lower extremity venous was negative for Study:           DVT. Performing Technologist: Darlin Coco RDMS,RVT  Examination Guidelines: A complete evaluation includes B-mode imaging, spectral Doppler, color Doppler, and power Doppler as needed of all accessible portions of each vessel. Bilateral testing is considered an integral part of a complete examination. Limited examinations for reoccurring indications may be performed as noted. The reflux portion of the exam is performed with the patient in reverse Trendelenburg.  +---------+---------------+---------+-----------+----------+--------------+ RIGHT    CompressibilityPhasicitySpontaneityPropertiesThrombus Aging +---------+---------------+---------+-----------+----------+--------------+ CFV      Full           Yes      Yes                                 +---------+---------------+---------+-----------+----------+--------------+ SFJ      Full                                                        +---------+---------------+---------+-----------+----------+--------------+ FV Prox  Full                                                        +---------+---------------+---------+-----------+----------+--------------+ FV Mid   Full                                                         +---------+---------------+---------+-----------+----------+--------------+ FV DistalFull                                                        +---------+---------------+---------+-----------+----------+--------------+  PFV      Full                                                        +---------+---------------+---------+-----------+----------+--------------+ POP      Full           Yes      Yes                                 +---------+---------------+---------+-----------+----------+--------------+ PTV      Full                                                        +---------+---------------+---------+-----------+----------+--------------+ PERO     Full                                                        +---------+---------------+---------+-----------+----------+--------------+   +---------+---------------+---------+-----------+----------+--------------+ LEFT     CompressibilityPhasicitySpontaneityPropertiesThrombus Aging +---------+---------------+---------+-----------+----------+--------------+ CFV      Full           Yes      Yes                                 +---------+---------------+---------+-----------+----------+--------------+ SFJ      Full                                                        +---------+---------------+---------+-----------+----------+--------------+ FV Prox  Full                                                        +---------+---------------+---------+-----------+----------+--------------+ FV Mid   Full                                                        +---------+---------------+---------+-----------+----------+--------------+ FV DistalFull                                                        +---------+---------------+---------+-----------+----------+--------------+ PFV      Full                                                         +---------+---------------+---------+-----------+----------+--------------+  POP      Full           Yes      Yes                                 +---------+---------------+---------+-----------+----------+--------------+ PTV      Full                                                        +---------+---------------+---------+-----------+----------+--------------+ PERO     Full                                                        +---------+---------------+---------+-----------+----------+--------------+    Summary: RIGHT: - There is no evidence of deep vein thrombosis in the lower extremity.  - No cystic structure found in the popliteal fossa.  - Ultrasound characteristics of enlarged lymph nodes are noted in the groin.  LEFT: - There is no evidence of deep vein thrombosis in the lower extremity.  - No cystic structure found in the popliteal fossa.  *See table(s) above for measurements and observations.    Preliminary      Assessment/Plan:  Staph epi bacteremia, suspect involved with port a cath s/p removal = repeat blood cx are ngtd. TTE did not suggest vegetation but a limited study. At minimum, to treat for 2 wks since 6/14 . May consider to treat longer if suspicion for endocarditis.   Hypoxia = agree with work up to rule out PE; concern for hypercoagulability with underlying malignancy  Leukocytosis = thought to be related to relapse HL dr Marin Olp planning to retreat. Defer to him to timing of re-initiation of treatment    [facility] for Infectious Diseases Cell: [phone] Pager: [phone]  09/24/2020, 5:01 PM

## 2020-09-24 NOTE — Progress Notes (Signed)
Continued hypoxemia.  100% FiO2.  Requiring facemask oxygen to correct.  Duplex negative for DVT.  Chest x-ray noncontributory other than baseline fibrosis.  Line - Get CT angiogram chest to rule out pulmonary embolism - Change to pressure support ventilation [RT will change the tracheostomy out] and if needed [facility]      SIGNATURE    Dr. Brand Males, M.D., F.C.C.P,  Pulmonary and Critical Care Medicine Staff Physician, [place] Director - Interstitial Lung Disease  Program  Pulmonary [place] [facility] [place], [place], [address]  Pager: [phone], If no answer  OR between  19:00-7:00h: page [phone] Telephone (clinical office): [phone] Telephone (research): [phone]  4:44 PM 09/24/2020

## 2020-09-25 ENCOUNTER — Encounter: Payer: Self-pay | Admitting: *Deleted

## 2020-09-25 ENCOUNTER — Inpatient Hospital Stay (HOSPITAL_COMMUNITY): Payer: BC Managed Care – PPO

## 2020-09-25 ENCOUNTER — Other Ambulatory Visit (HOSPITAL_COMMUNITY): Payer: Self-pay

## 2020-09-25 ENCOUNTER — Inpatient Hospital Stay: Payer: BC Managed Care – PPO

## 2020-09-25 ENCOUNTER — Encounter: Payer: Self-pay | Admitting: Hematology

## 2020-09-25 ENCOUNTER — Inpatient Hospital Stay: Payer: BC Managed Care – PPO | Admitting: Hematology & Oncology

## 2020-09-25 ENCOUNTER — Encounter: Payer: Self-pay | Admitting: Hematology & Oncology

## 2020-09-25 ENCOUNTER — Encounter (HOSPITAL_COMMUNITY): Payer: Self-pay | Admitting: Internal Medicine

## 2020-09-25 DIAGNOSIS — E1165 Type 2 diabetes mellitus with hyperglycemia: Secondary | ICD-10-CM | POA: Diagnosis not present

## 2020-09-25 DIAGNOSIS — D649 Anemia, unspecified: Secondary | ICD-10-CM | POA: Diagnosis not present

## 2020-09-25 DIAGNOSIS — J8410 Pulmonary fibrosis, unspecified: Secondary | ICD-10-CM

## 2020-09-25 DIAGNOSIS — Z7409 Other reduced mobility: Secondary | ICD-10-CM

## 2020-09-25 DIAGNOSIS — J9621 Acute and chronic respiratory failure with hypoxia: Secondary | ICD-10-CM | POA: Diagnosis not present

## 2020-09-25 DIAGNOSIS — E873 Alkalosis: Secondary | ICD-10-CM

## 2020-09-25 DIAGNOSIS — A419 Sepsis, unspecified organism: Secondary | ICD-10-CM

## 2020-09-25 DIAGNOSIS — B957 Other staphylococcus as the cause of diseases classified elsewhere: Secondary | ICD-10-CM

## 2020-09-25 DIAGNOSIS — I959 Hypotension, unspecified: Secondary | ICD-10-CM

## 2020-09-25 LAB — GLUCOSE, CAPILLARY
Glucose-Capillary: 309 mg/dL — ABNORMAL HIGH (ref 70–99)
Glucose-Capillary: 320 mg/dL — ABNORMAL HIGH (ref 70–99)
Glucose-Capillary: 312 mg/dL — ABNORMAL HIGH (ref 70–99)
Glucose-Capillary: 284 mg/dL — ABNORMAL HIGH (ref 70–99)
Glucose-Capillary: 287 mg/dL — ABNORMAL HIGH (ref 70–99)

## 2020-09-25 LAB — CBC WITH DIFFERENTIAL/PLATELET
Abs Immature Granulocytes: 0.27 10*3/uL — ABNORMAL HIGH (ref 0.00–0.07)
Basophils Absolute: 0 10*3/uL (ref 0.0–0.1)
Basophils Relative: 0 %
Eosinophils Absolute: 0 10*3/uL (ref 0.0–0.5)
Eosinophils Relative: 0 %
HCT: 29.9 % — ABNORMAL LOW (ref 39.0–52.0)
Hemoglobin: 8.7 g/dL — ABNORMAL LOW (ref 13.0–17.0)
Immature Granulocytes: 2 %
Lymphocytes Relative: 9 %
Lymphs Abs: 1.6 10*3/uL (ref 0.7–4.0)
MCH: 28.8 pg (ref 26.0–34.0)
MCHC: 29.1 g/dL — ABNORMAL LOW (ref 30.0–36.0)
MCV: 99 fL (ref 80.0–100.0)
Monocytes Absolute: 1.2 10*3/uL — ABNORMAL HIGH (ref 0.1–1.0)
Monocytes Relative: 7 %
Neutro Abs: 13.8 10*3/uL — ABNORMAL HIGH (ref 1.7–7.7)
Neutrophils Relative %: 82 %
Platelets: 202 10*3/uL (ref 150–400)
RBC: 3.02 MIL/uL — ABNORMAL LOW (ref 4.22–5.81)
RDW: 17.6 % — ABNORMAL HIGH (ref 11.5–15.5)
WBC: 16.9 10*3/uL — ABNORMAL HIGH (ref 4.0–10.5)
nRBC: 0 % (ref 0.0–0.2)

## 2020-09-25 LAB — COMPREHENSIVE METABOLIC PANEL
ALT: 18 U/L (ref 0–44)
AST: 13 U/L — ABNORMAL LOW (ref 15–41)
Albumin: 2 g/dL — ABNORMAL LOW (ref 3.5–5.0)
Alkaline Phosphatase: 59 U/L (ref 38–126)
Anion gap: 7 (ref 5–15)
BUN: 25 mg/dL — ABNORMAL HIGH (ref 8–23)
CO2: 41 mmol/L — ABNORMAL HIGH (ref 22–32)
Calcium: 9 mg/dL (ref 8.9–10.3)
Chloride: 93 mmol/L — ABNORMAL LOW (ref 98–111)
Creatinine, Ser: 0.47 mg/dL — ABNORMAL LOW (ref 0.61–1.24)
GFR, Estimated: >60 mL/min (ref >60)
Glucose, Bld: 271 mg/dL — ABNORMAL HIGH (ref 70–99)
Potassium: 4.3 mmol/L (ref 3.5–5.1)
Sodium: 141 mmol/L (ref 135–145)
Total Bilirubin: 0.4 mg/dL (ref 0.3–1.2)
Total Protein: 5.5 g/dL — ABNORMAL LOW (ref 6.5–8.1)

## 2020-09-25 LAB — VITAMIN D 1,25 DIHYDROXY
Vitamin D 1, 25 (OH)2 Total: 116 pg/mL — ABNORMAL HIGH (ref 18–72)
Vitamin D2 1, 25 (OH)2: <8 pg/mL
Vitamin D3 1, 25 (OH)2: 116 pg/mL

## 2020-09-25 LAB — LACTATE DEHYDROGENASE: LDH: 163 U/L (ref 98–192)

## 2020-09-25 MED ORDER — SERTRALINE HCL 50 MG PO TABS
50.0000 mg | ORAL_TABLET | Freq: Every day | ORAL | Status: DC
  Administered 2020-09-25 – 2020-10-11 (×17): 50 mg
  Filled 2020-09-25 (×17): qty 1

## 2020-09-25 MED ORDER — MIDODRINE HCL 5 MG PO TABS
5.0000 mg | ORAL_TABLET | Freq: Three times a day (TID) | ORAL | Status: DC
  Administered 2020-09-25 – 2020-10-10 (×46): 5 mg
  Filled 2020-09-25 (×48): qty 1

## 2020-09-25 MED ORDER — FREE WATER
100.0000 mL | Freq: Four times a day (QID) | Status: DC
  Administered 2020-09-25 – 2020-10-13 (×71): 100 mL

## 2020-09-25 MED ORDER — ORAL CARE MOUTH RINSE: 15.0000 mL | OROMUCOSAL | Status: DC

## 2020-09-25 MED ORDER — INSULIN ASPART 100 UNIT/ML IJ SOLN
0.0000 [IU] | INTRAMUSCULAR | Status: DC
  Administered 2020-09-25 (×2): 8 [IU] via SUBCUTANEOUS
  Administered 2020-09-25: 11 [IU] via SUBCUTANEOUS
  Administered 2020-09-26 (×2): 3 [IU] via SUBCUTANEOUS
  Administered 2020-09-26: 5 [IU] via SUBCUTANEOUS

## 2020-09-25 MED ORDER — HYOSCYAMINE SULFATE 0.125 MG/5ML PO ELIX
0.1250 mg | ORAL_SOLUTION | ORAL | Status: DC | PRN
  Administered 2020-10-01: 0.125 mg
  Filled 2020-09-25 (×2): qty 5

## 2020-09-25 MED ORDER — VITAMIN D3 25 MCG (1000 UNIT) PO TABS
10000.0000 [IU] | ORAL_TABLET | Freq: Every day | ORAL | Status: DC
  Administered 2020-09-25 – 2020-10-26 (×32): 10000 [IU] via ORAL
  Filled 2020-09-25 (×32): qty 10

## 2020-09-25 MED ORDER — LORAZEPAM 0.5 MG PO TABS: 0.5000 mg | ORAL_TABLET | Freq: Four times a day (QID) | ORAL | Status: DC | PRN

## 2020-09-25 MED ORDER — CHLORHEXIDINE GLUCONATE 0.12% ORAL RINSE (MEDLINE KIT)
15.0000 mL | Freq: Two times a day (BID) | OROMUCOSAL | Status: DC
  Administered 2020-09-25: 15 mL via OROMUCOSAL

## 2020-09-25 MED ORDER — INSULIN GLARGINE 100 UNIT/ML ~~LOC~~ SOLN
5.0000 [IU] | Freq: Two times a day (BID) | SUBCUTANEOUS | Status: DC
  Administered 2020-09-25: 5 [IU] via SUBCUTANEOUS
  Filled 2020-09-25 (×2): qty 0.05

## 2020-09-25 MED ORDER — VITAL AF 1.2 CAL PO LIQD
1000.0000 mL | ORAL | Status: DC
  Administered 2020-09-25 – 2020-10-12 (×18): 1000 mL

## 2020-09-25 MED ORDER — ORAL CARE MOUTH RINSE
15.0000 mL | OROMUCOSAL | Status: DC
  Administered 2020-09-25 – 2020-10-07 (×81): 15 mL via OROMUCOSAL

## 2020-09-25 MED ORDER — SODIUM CHLORIDE (PF) 0.9 % IJ SOLN
INTRAMUSCULAR | Status: AC
  Administered 2020-09-25: 10 mL
  Filled 2020-09-25: qty 50

## 2020-09-25 MED ORDER — HYOSCYAMINE SULFATE 0.125 MG/ML PO SOLN
0.1250 mg | ORAL | Status: DC | PRN
  Filled 2020-09-25: qty 1

## 2020-09-25 MED ORDER — IOHEXOL 350 MG/ML SOLN
100.0000 mL | Freq: Once | INTRAVENOUS | Status: AC | PRN
  Administered 2020-09-25: 80 mL via INTRAVENOUS

## 2020-09-25 NOTE — TOC Progression Note (Signed)
Transition of Care [facility]) - Progression Note    Patient Details  Name: Steve [name] MRN: [id number] Date of Birth: Jun 22, 1955  Transition of [facility]) CM/SW Contact  Leeroy Cha, RN Phone Number: 09/25/2020, 8:43 AM  Clinical Narrative:    Transferred back to icu on [id number] due to hypoxia, placed on vent at 50%.   Expected Discharge Plan: [place] Barriers to Discharge: Continued Medical Work up  Expected Discharge Plan and Services Expected Discharge Plan: [place] In-house Referral: Clinical Social Work Discharge Planning Services: CM Consult Post Acute Care Choice: [place] arrangements for the past 2 months: Mobile Home                 DME Arranged: N/A DME Agency: NA       HH Arranged: PT [place]: [place] ([place]) Date [place]: 09/22/20   Representative spoke with at [place]: [place] (SDOH) Interventions    Readmission Risk Interventions Readmission Risk Prevention Plan 09/22/2020 08/24/2020 07/22/2019  Transportation Screening Complete Complete Complete  Medication Review Press photographer) Complete Complete Complete  HRI or Home Care Consult Complete Complete -  SW Recovery Care/Counseling Consult Complete Complete -  Palliative Care Screening Not Applicable Not Applicable -  [place] Not Applicable Not Applicable -  Some recent data might be hidden

## 2020-09-25 NOTE — Progress Notes (Signed)
PROGRESS NOTE  Steve [name] DOB: [birth year]   PCP: [name], PA-C  Patient is from: Home.  Lives with his wife.  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   On 09/24/2020, patient developed respiratory distress with increased oxygen requirement, and transferred to ICU and started on pressure support ventilation.   Subjective: Seen and examined earlier this morning.  No major events overnight of this morning.  No complaints.  He denies pain.  Remains on pressure support.  CBG elevated.  Objective: Vitals:   09/25/20 0823 09/25/20 0900 09/25/20 1006 09/25/20 1100  BP:  (!) 105/50 114/60 (!) 94/51  Pulse:  (!) 102 96 79  Resp:  (!) 36 (!) 22 (!) 27  Temp: 98.5 F (36.9 C)     TempSrc: Oral     SpO2:  95% 99% 98%  Weight:        Intake/Output Summary (Last 24 hours) at 09/25/2020 1251 Last data filed at 09/25/2020 1112 Gross per 24 hour  Intake 3752.7 ml  Output 1325 ml  Net 2427.7 ml   Filed Weights   09/19/20 1930  Weight: 63.3 kg    Examination:  GENERAL: No apparent distress.  Nontoxic. HEENT: MMM.  Vision and hearing grossly intact.  NECK: Tracheostomy. RESP: On MV via trach.  No IWOB.  Fair aeration bilaterally. CVS:  RRR. Heart sounds normal.  ABD/GI/GU: BS+. Abd soft, NTND.  PEG tube in place. MSK/EXT:  Moves extremities. No apparent deformity. No edema.  SKIN: no apparent skin lesion or wound NEURO: Awake, alert and oriented appropriately.  No apparent focal neuro deficit. PSYCH: Calm. Normal  affect.   Procedures:  None  Microbiology summarized: [id number]-[id number] and influenza PCR nonreactive. MRSA PCR screen negative. 6/11-blood culture with staph epidermis-MecA/C 6/14-blood cultures NGTD.  Assessment & Plan: Sepsis due to possible pneumonia/staph epidermis bacteremia: POA. Had Leukocytosis, tachycardia pia and tachycardia on presentation.  CXR showed possible recurrent pneumonia but difficult to interpret.  CT chest showed interval increase in bulk of mediastinal and axillary adenopathy.  Culture data as above.  Port a cath removed on 6/16. -IV cefepime 6/11-09/2013 -IV vancomycin 6/11>>> for minimum of 2 weeks per ID -ID recommended TEE to exclude endocarditis-to be coordinated with PCCM -Continue midodrine and stress dose steroid for blood pressure support   Hypotension: Likely due to the above. -On midodrine and stress dose steroid.   Acute on chronic hypoxic respiratory failure in patient with history of tracheostomy after COVID-19 infection and pulmonary fibrosis-on 8 L via trach at baseline.  CXR and CT chest as above. -Started on pressure support ventilation on 6/16 -PCCM managing. -SLP recommended regular diet.      Hodgkin's lymphoma: CT chest concerning for worsening LAD and progression. -Oncology following.     Uncontrolled DM-2 with hyperglycemia Recent Labs  Lab 09/24/20 2149 09/24/20 2333 09/25/20 0417 09/25/20 0737 09/25/20 1144  GLUCAP 331* 273* 309* 320* 312*  -Increase SSI to moderate every 4 hours. -Increase Lantus from 5 units daily to twice daily -Further adjustment as appropriate  Normocytic anemia/anemia of chronic disease: Anemia panel  consistent with anemia of chronic disease. Recent Labs    08/22/20 0345 08/24/20 0510 09/08/20 1140 09/18/20 1212 09/19/20 1511 09/20/20 1015 09/22/20 0536 09/23/20 1026 09/24/20 0534 09/25/20 0258  HGB 11.2* 11.6* 11.3* 10.8* 10.2* 10.2* 9.1* 9.9* 10.0* 8.7*  -Monitor.    Hyponatremia:  Resolved.  Metabolic alkalosis: CO2 retention?  He is on mechanical ventilation. -Continue monitoring  Leukocytosis/bandemia: Improving.  Likely due to #1.  Severe protein calorie malnutrition: POA. Body mass index is 23.22 kg/m. Nutrition Problem: Severe Malnutrition Etiology: chronic illness, cancer and cancer related treatments Signs/Symptoms: severe fat depletion, severe muscle depletion Interventions: Tube feeding, Prostat, MVI   enoxaparin (LOVENOX) injection 40 mg Start: 09/19/20 2200  Code Status: Full code Family Communication: Patient and/or RN. Available if any question.  Level of care: ICU Status is: Inpatient  Remains inpatient appropriate because:Hemodynamically unstable, Unsafe d/c plan, IV treatments appropriate due to intensity of illness or inability to take PO, and Inpatient level of care appropriate due to severity of illness  Dispo: The patient is from: Home              Anticipated d/c is to: Home              Patient currently is not medically stable to d/c.   Difficult to place patient No       Consultants:  Pulmonology Hematology/oncology   Sch Meds:  Scheduled Meds:  acetylcysteine  4 mL Nebulization Q4H   chlorhexidine gluconate (MEDLINE KIT)  15 mL Mouth Rinse BID   Chlorhexidine Gluconate Cloth  6 each Topical Q0600   cholecalciferol  10,000 Units Oral Daily   enoxaparin (LOVENOX) injection  40 mg Subcutaneous Q24H   feeding supplement (PROSource TF)  45 mL Per Tube BID   hydrocortisone sod succinate (SOLU-CORTEF) inj  50 mg Intravenous Q8H   insulin aspart  0-15 Units Subcutaneous Q4H   insulin glargine  5 Units Subcutaneous BID   mouth rinse  15 mL Mouth Rinse BID   midodrine  5 mg Per Tube TID WC   multivitamin  15 mL Per Tube Daily   pantoprazole sodium  40 mg Per Tube Daily   sertraline  50 mg Per Tube Daily   traZODone  100 mg Per Tube QHS   Continuous Infusions:  feeding supplement (OSMOLITE 1.2 CAL) 65 mL/hr at 09/24/20  1849   vancomycin Stopped (09/25/20 0545)   PRN Meds:.albuterol, guaiFENesin, hyoscyamine, LORazepam  Antimicrobials: Anti-infectives (From admission, onward)    Start     Dose/Rate Route Frequency Ordered Stop   09/23/20 0800  ceFEPIme (MAXIPIME) 2 g in sodium chloride 0.9 % 100 mL IVPB  Status:  Discontinued        2 g 200 mL/hr over 30 Minutes Intravenous Every 8 hours 09/23/20 0738 09/23/20 1758   09/21/20 0400  vancomycin (VANCOREADY) IVPB 750 mg/150 mL        750 mg 150 mL/hr over 60 Minutes Intravenous Every 12 hours 09/20/20 1715     09/20/20 1730  vancomycin (VANCOREADY) IVPB 750 mg/150 mL        750 mg 150 mL/hr over 60 Minutes Intravenous STAT 09/20/20 1714 09/20/20 1954   09/20/20 0400  vancomycin (VANCOREADY) IVPB 750 mg/150 mL  Status:  Discontinued        750 mg 150 mL/hr over 60 Minutes Intravenous Every 12 hours 09/19/20 1932 09/20/20 1155   09/20/20 0000  ceFEPIme (MAXIPIME) 2 g in sodium chloride 0.9 % 100 mL IVPB  Status:  Discontinued        2 g 200 mL/hr over 30 Minutes Intravenous Every 8 hours 09/19/20 1932 09/23/20 0721   09/19/20 1600  ceFEPIme (MAXIPIME) 2 g in sodium chloride 0.9 % 100 mL IVPB        2 g 200 mL/hr over 30 Minutes Intravenous  Once 09/19/20 1548 09/19/20 1634   09/19/20 1600  vancomycin (VANCOREADY) IVPB 1250 mg/250 mL        1,250 mg 166.7 mL/hr over 90 Minutes Intravenous STAT 09/19/20 1557 09/19/20 1822        I have personally reviewed the following labs and images: CBC: Recent Labs  Lab 09/19/20 1511 09/20/20 1015 09/22/20 0536 09/23/20 1026 09/24/20 0534 09/25/20 0258  WBC 19.0* 24.9* 17.2* 19.2* 20.4* 16.9*  NEUTROABS 15.1* 21.8*  --  16.0* 16.4* 13.8*  HGB 10.2* 10.2* 9.1* 9.9* 10.0* 8.7*  HCT 33.1* 33.3* 29.8* 34.0* 35.3* 29.9*  MCV 93.2 93.5 97.1 99.7 102.0* 99.0  PLT 313 314 237 236 218 202   BMP &GFR Recent Labs  Lab 09/20/20 0708 09/22/20 0536 09/23/20 1026 09/24/20 0534 09/25/20 0258  NA 135 138 140  141 141  K 4.7 4.1 4.7 4.7 4.3  CL 87* 92* 94* 95* 93*  CO2 43* 43* 42* 41* 41*  GLUCOSE 254* 326* 317* 391* 271*  BUN 25* 25* 23 24* 25*  CREATININE 0.55* 0.51* 0.51* 0.55* 0.47*  CALCIUM 9.6 9.3 9.4 9.7 9.0  MG 1.9 2.1  --  2.1  --   PHOS  --  2.7  --  2.3*  --    Estimated Creatinine Clearance: 81.1 mL/min (A) (by C-G formula based on SCr of 0.47 mg/dL (L)). Liver & Pancreas: Recent Labs  Lab 09/19/20 1511 09/20/20 0708 09/23/20 1026 09/24/20 0534 09/25/20 0258  AST 15 15 11* 12* 13*  ALT $Re'28 30 19 17 18  'SWT$ ALKPHOS 83 82 73 73 59  BILITOT 0.6 0.8 0.3 0.2* 0.4  PROT 6.3* 6.7 6.3* 6.4* 5.5*  ALBUMIN 2.3* 2.2* 2.1* 2.2* 2.0*   No results for input(s): LIPASE, AMYLASE in the last 168 hours. No results for input(s): AMMONIA in the last 168 hours. Diabetic: No results for input(s): HGBA1C in the last 72 hours. Recent Labs  Lab 09/24/20 2149 09/24/20 2333 09/25/20 0417 09/25/20 0737 09/25/20 1144  GLUCAP 331* 273* 309* 320* 312*   Cardiac Enzymes: Recent Labs  Lab 09/19/20 1511  CKTOTAL 11*   No results for input(s): PROBNP in the last 8760 hours. Coagulation Profile: No results for input(s): INR, PROTIME in the last 168 hours. Thyroid Function Tests: No results for input(s): TSH, T4TOTAL, FREET4, T3FREE, THYROIDAB in the last 72 hours. Lipid Profile: No results for input(s): CHOL, HDL, LDLCALC, TRIG, CHOLHDL, LDLDIRECT in the last 72 hours. Anemia Panel: Recent Labs    09/23/20 1026  TIBC 106*  IRON 17*   Urine analysis:    Component Value Date/Time   COLORURINE YELLOW 09/19/2020 1511   APPEARANCEUR HAZY (A) 09/19/2020 1511   LABSPEC 1.012 09/19/2020 1511   PHURINE 7.0 09/19/2020 1511   GLUCOSEU NEGATIVE 09/19/2020 1511   HGBUR NEGATIVE 09/19/2020 1511   HGBUR negative 03/23/2007 0000   BILIRUBINUR NEGATIVE 09/19/2020 1511   BILIRUBINUR neg 11/11/2016 1017   KETONESUR NEGATIVE 09/19/2020 1511   PROTEINUR NEGATIVE 09/19/2020 1511   UROBILINOGEN 0.2  11/11/2016 1017   UROBILINOGEN 0.2 11/08/2012 0915   NITRITE NEGATIVE 09/19/2020 1511   LEUKOCYTESUR NEGATIVE 09/19/2020 1511   Sepsis Labs: Invalid  input(s): PROCALCITONIN, LACTICIDVEN  Microbiology: Recent Results (from the past 240 hour(s))  Urine culture     Status: Abnormal   Collection Time: 09/19/20  3:11 PM   Specimen: Urine, Clean Catch  Result Value Ref Range Status   Specimen Description   Final    URINE, CLEAN CATCH Performed at [facility], [place] [address] [place], [place] [address]    Special Requests   Final    NONE Performed at [facility], [place] [address] [place], [place] [address]    Culture MULTIPLE SPECIES PRESENT, SUGGEST RECOLLECTION (A)  Final   Report Status 09/21/2020 FINAL  Final  Culture, blood (routine x 2)     Status: Abnormal   Collection Time: 09/19/20  3:11 PM   Specimen: BLOOD  Result Value Ref Range Status   Specimen Description   Final    BLOOD PORTA CATH Performed at [place] [address], [place], [place] [address]    Special Requests   Final    BOTTLES DRAWN AEROBIC AND ANAEROBIC Blood Culture results may not be optimal due to an excessive volume of blood received in culture bottles Performed at [place] [address], [place], [place] [address]    Culture  Setup Time   Final    GRAM POSITIVE COCCI IN BOTH AEROBIC AND ANAEROBIC BOTTLES CRITICAL RESULT CALLED TO, READ BACK BY AND VERIFIED WITH: M,SWAYNE PHARMD $RemoveBeforeD'@1552'[id number]$  09/20/20 EB Performed at [facility], [place] [address] [place], [place] [address]    Culture STAPHYLOCOCCUS EPIDERMIDIS (A)  Final   Report Status 09/22/2020 FINAL  Final   Organism ID, Bacteria STAPHYLOCOCCUS EPIDERMIDIS  Final      Susceptibility   Staphylococcus epidermidis - MIC*    CIPROFLOXACIN >=8 RESISTANT Resistant     ERYTHROMYCIN >=8 RESISTANT Resistant     GENTAMICIN 8 INTERMEDIATE Intermediate     OXACILLIN >=4  RESISTANT Resistant     TETRACYCLINE >=16 RESISTANT Resistant     VANCOMYCIN 1 SENSITIVE Sensitive     TRIMETH/SULFA 80 RESISTANT Resistant     CLINDAMYCIN >=8 RESISTANT Resistant     RIFAMPIN <=0.5 SENSITIVE Sensitive     Inducible Clindamycin NEGATIVE Sensitive     * STAPHYLOCOCCUS EPIDERMIDIS  Blood Culture ID Panel (Reflexed)     Status: Abnormal   Collection Time: 09/19/20  3:11 PM  Result Value Ref Range Status   Enterococcus faecalis NOT DETECTED NOT DETECTED Final   Enterococcus Faecium NOT DETECTED NOT DETECTED Final   Listeria monocytogenes NOT DETECTED NOT DETECTED Final   Staphylococcus species DETECTED (A) NOT DETECTED Final    Comment: CRITICAL RESULT CALLED TO, READ BACK BY AND VERIFIED WITH: M,SWAYNE PHARMD $RemoveBeforeD'@1552'[email or web address]$  09/20/20 EB    Staphylococcus aureus (BCID) NOT DETECTED NOT DETECTED Final   Staphylococcus epidermidis DETECTED (A) NOT DETECTED Final    Comment: Methicillin (oxacillin) resistant coagulase negative staphylococcus. Possible blood culture contaminant (unless isolated from more than one blood culture draw or clinical case suggests pathogenicity). No antibiotic treatment is indicated for blood  culture contaminants. CRITICAL RESULT CALLED TO, READ BACK BY AND VERIFIED WITH: M,SWAYNE PHARMD $RemoveBeforeD'@1552'[email or web address]$  09/20/20 EB    Staphylococcus lugdunensis NOT DETECTED NOT DETECTED Final   Streptococcus species NOT DETECTED NOT DETECTED Final   Streptococcus agalactiae NOT DETECTED NOT DETECTED Final   Streptococcus pneumoniae NOT DETECTED NOT DETECTED Final   Streptococcus pyogenes NOT DETECTED NOT DETECTED Final   A.calcoaceticus-baumannii NOT DETECTED NOT DETECTED Final  Bacteroides fragilis NOT DETECTED NOT DETECTED Final   Enterobacterales NOT DETECTED NOT DETECTED Final   Enterobacter cloacae complex NOT DETECTED NOT DETECTED Final   Escherichia coli NOT DETECTED NOT DETECTED Final   Klebsiella aerogenes NOT DETECTED NOT DETECTED Final   Klebsiella oxytoca NOT DETECTED  NOT DETECTED Final   Klebsiella pneumoniae NOT DETECTED NOT DETECTED Final   Proteus species NOT DETECTED NOT DETECTED Final   Salmonella species NOT DETECTED NOT DETECTED Final   Serratia marcescens NOT DETECTED NOT DETECTED Final   Haemophilus influenzae NOT DETECTED NOT DETECTED Final   Neisseria meningitidis NOT DETECTED NOT DETECTED Final   Pseudomonas aeruginosa NOT DETECTED NOT DETECTED Final   Stenotrophomonas maltophilia NOT DETECTED NOT DETECTED Final   Candida albicans NOT DETECTED NOT DETECTED Final   Candida auris NOT DETECTED NOT DETECTED Final   Candida glabrata NOT DETECTED NOT DETECTED Final   Candida krusei NOT DETECTED NOT DETECTED Final   Candida parapsilosis NOT DETECTED NOT DETECTED Final   Candida tropicalis NOT DETECTED NOT DETECTED Final   Cryptococcus neoformans/gattii NOT DETECTED NOT DETECTED Final   Methicillin resistance mecA/C DETECTED (A) NOT DETECTED Final    Comment: CRITICAL RESULT CALLED TO, READ BACK BY AND VERIFIED WITH: M,SWAYNE PHARMD $RemoveBeforeD'@1552'[email or web address]$  09/20/20 EB Performed at [facility], [address] [place], [place] [address]   Culture, blood (routine x 2)     Status: Abnormal   Collection Time: 09/19/20  3:16 PM   Specimen: BLOOD  Result Value Ref Range Status   Specimen Description   Final    BLOOD SITE NOT SPECIFIED Performed at [place] [address], [place], [place] [address]    Special Requests   Final    BOTTLES DRAWN AEROBIC AND ANAEROBIC Blood Culture adequate volume Performed at [facility] [address] [place], [place] [address]    Culture  Setup Time   Final    GRAM POSITIVE COCCI IN BOTH AEROBIC AND ANAEROBIC BOTTLES CRITICAL VALUE NOTED.  VALUE IS CONSISTENT WITH PREVIOUSLY REPORTED AND CALLED VALUE.    Culture (A)  Final    STAPHYLOCOCCUS EPIDERMIDIS SUSCEPTIBILITIES PERFORMED ON PREVIOUS CULTURE WITHIN THE LAST 5 DAYS. Performed at [facility], [place] [address] [place], [place] [address]    Report Status 09/22/2020 FINAL  Final  Resp Panel by RT-PCR (Flu A&B, Covid) Nasopharyngeal Swab     Status: None   Collection Time: 09/19/20  3:49 PM   Specimen: Nasopharyngeal Swab; Nasopharyngeal(NP) swabs in vial transport medium  Result Value Ref Range Status   SARS Coronavirus 2 by RT PCR NEGATIVE NEGATIVE Final    Comment: (NOTE) SARS-CoV-2 target nucleic acids are NOT DETECTED.  The SARS-CoV-2 RNA is generally detectable in upper respiratory specimens during the acute phase of infection. The lowest concentration of SARS-CoV-2 viral copies this assay can detect is 138 copies/mL. A negative result does not preclude SARS-Cov-2 infection and should not be used as the sole basis for treatment or other patient management decisions. A negative result may occur with  improper specimen collection/handling, submission of specimen other than nasopharyngeal swab, presence of viral mutation(s) within the areas targeted by this assay, and inadequate number of viral copies(<138 copies/mL). A negative result must be combined with clinical observations, patient history, and epidemiological information. The expected result is Negative.  Fact Sheet for Patients:  EntrepreneurPulse.com.au  Fact Sheet for Healthcare Providers:  IncredibleEmployment.be  This test is no t yet approved or cleared by  the [employer] and  has been authorized for detection and/or diagnosis of SARS-CoV-2 by FDA under an Emergency Use Authorization (EUA). This EUA will remain  in effect (meaning this test can be used) for the duration of the COVID-19 declaration under Section 564(b)(1) of the Act, 21 U.S.C.section 360bbb-3(b)(1), unless the authorization is terminated  or revoked sooner.       Influenza A by PCR NEGATIVE NEGATIVE Final   Influenza B by PCR NEGATIVE NEGATIVE Final    Comment: (NOTE) The Xpert Xpress SARS-CoV-2/FLU/RSV plus  assay is intended as an aid in the diagnosis of influenza from Nasopharyngeal swab specimens and should not be used as a sole basis for treatment. Nasal washings and aspirates are unacceptable for Xpert Xpress SARS-CoV-2/FLU/RSV testing.  Fact Sheet for Patients: BloggerCourse.com  Fact Sheet for Healthcare Providers: SeriousBroker.it  This test is not yet approved or cleared by the [place] FDA and has been authorized for detection and/or diagnosis of SARS-CoV-2 by FDA under an Emergency Use Authorization (EUA). This EUA will remain in effect (meaning this test can be used) for the duration of the COVID-19 declaration under Section 564(b)(1) of the Act, 21 U.S.C. section 360bbb-3(b)(1), unless the authorization is terminated or revoked.  Performed at [facility], [address] [place], [place] [address]   MRSA PCR Screening     Status: None   Collection Time: 09/20/20  6:50 AM   Specimen: Nasopharyngeal  Result Value Ref Range Status   MRSA by PCR NEGATIVE NEGATIVE Final    Comment:        The GeneXpert MRSA Assay (FDA approved for NASAL specimens only), is one component of a comprehensive MRSA colonization surveillance program. It is not intended to diagnose MRSA infection nor to guide or monitor treatment for MRSA infections. Performed at [facility], [address], [place], [place] [address]   Culture, blood (Routine X 2) w Reflex to ID Panel     Status: None (Preliminary result)   Collection Time: 09/22/20  7:06 PM   Specimen: BLOOD  Result Value Ref Range Status   Specimen Description   Final    BLOOD RIGHT ANTECUBITAL Performed at [facility], [address] [place], [place] [address]    Special Requests   Final    BOTTLES DRAWN AEROBIC AND ANAEROBIC Blood Culture adequate volume Performed at [facility], [address], [place], [place] [address]    Culture   Final    NO GROWTH 3 DAYS Performed at [facility], [address] [place], [place] [address]    Report Status PENDING  Incomplete  Culture, blood (Routine X 2) w Reflex to ID Panel     Status: None (Preliminary result)   Collection Time: 09/22/20  7:06 PM   Specimen: BLOOD RIGHT HAND  Result Value Ref Range Status   Specimen Description   Final    BLOOD RIGHT HAND Performed at [facility], [address], [place], [place] [address]    Special Requests   Final    BOTTLES DRAWN AEROBIC ONLY Blood Culture adequate volume Performed at [facility], [address], [place], [place] [address]    Culture   Final    NO GROWTH 3 DAYS Performed at [facility], [address] [place], [place] [address]    Report Status PENDING  Incomplete  MRSA PCR Screening     Status: None  Collection Time: 09/24/20 10:47 AM  Result Value Ref Range Status   MRSA by PCR NEGATIVE NEGATIVE Final    Comment:        The GeneXpert MRSA Assay (FDA approved for NASAL specimens only), is one component of a comprehensive MRSA colonization surveillance program. It is not intended to diagnose MRSA infection nor to guide or monitor treatment for MRSA infections. Performed at [facility], [place] [address] [place], [place] [address]     Radiology Studies: CT Angio Chest Pulmonary Embolism (PE) W or WO Contrast  Result Date: 09/25/2020 CLINICAL DATA:  Cough and hypoxia.  COVID-19. EXAM: CT ANGIOGRAPHY CHEST WITH CONTRAST TECHNIQUE: Multidetector CT imaging of the chest was performed using the standard protocol during bolus administration of intravenous contrast. Multiplanar CT image reconstructions and MIPs were obtained to evaluate the vascular anatomy. CONTRAST:  24mL OMNIPAQUE IOHEXOL 350 MG/ML SOLN COMPARISON:  09/20/2020 FINDINGS: Cardiovascular: Contrast injection is sufficient to demonstrate  satisfactory opacification of the pulmonary arteries to the segmental level. There is no pulmonary embolus or evidence of right heart strain. The size of the main pulmonary artery is normal. Heart size is normal, with no pericardial effusion. The course and caliber of the aorta are normal. There is no atherosclerotic calcification. No acute aortic syndrome. Mediastinum/Nodes: Bulky mediastinal adenopathy is unchanged since the recent study of 09/20/2020. Lungs/Pleura: Tracheostomy tube tip is at the level of the clavicular heads. There is increased pleural fluid on the right. Diffuse pleural thickening is unchanged. Unchanged appearance of diffuse parenchymal interstitial abnormality. Upper Abdomen: Contrast bolus timing is not optimized for evaluation of the abdominal organs. The visualized portions of the organs of the upper abdomen are normal. Musculoskeletal: No chest wall abnormality. No bony spinal canal stenosis. Review of the MIP images confirms the above findings. IMPRESSION: 1. No pulmonary embolus or acute aortic syndrome. 2. Bulky mediastinal adenopathy and diffuse pleural, interstitial and parenchymal pulmonary abnormalities are unchanged since 09/20/2020. Electronically Signed   By: Ulyses Jarred M.D.   On: 09/25/2020 02:54   VAS [place] LOWER EXTREMITY VENOUS (DVT)  Result Date: 09/24/2020  Lower Venous DVT Study Patient Name:  DARRAN GABAY [facility]  Date of Exam:   09/24/2020 Medical Rec #: [id number]          Accession #:    [id number] Date of Birth: 1955-11-18           Patient Gender: M Patient Age:   64Y Exam Location:  [facility] Procedure:      VAS [place] LOWER EXTREMITY VENOUS (DVT) Referring Phys: [id number] MURALI RAMASWAMY --------------------------------------------------------------------------------  Indications: Post-Covid severe pulmonary fibrosis, relapsed Hodkin's lymphoma, edema.  Comparison       07-09-2019 Bilateral lower extremity venous was negative for Study:           DVT. Performing  Technologist: Darlin Coco RDMS,RVT  Examination Guidelines: A complete evaluation includes B-mode imaging, spectral Doppler, color Doppler, and power Doppler as needed of all accessible portions of each vessel. Bilateral testing is considered an integral part of a complete examination. Limited examinations for reoccurring indications may be performed as noted. The reflux portion of the exam is performed with the patient in reverse Trendelenburg.  +---------+---------------+---------+-----------+----------+--------------+ RIGHT    CompressibilityPhasicitySpontaneityPropertiesThrombus Aging +---------+---------------+---------+-----------+----------+--------------+ CFV      Full           Yes      Yes                                 +---------+---------------+---------+-----------+----------+--------------+  SFJ      Full                                                        +---------+---------------+---------+-----------+----------+--------------+ FV Prox  Full                                                        +---------+---------------+---------+-----------+----------+--------------+ FV Mid   Full                                                        +---------+---------------+---------+-----------+----------+--------------+ FV DistalFull                                                        +---------+---------------+---------+-----------+----------+--------------+ PFV      Full                                                        +---------+---------------+---------+-----------+----------+--------------+ POP      Full           Yes      Yes                                 +---------+---------------+---------+-----------+----------+--------------+ PTV      Full                                                        +---------+---------------+---------+-----------+----------+--------------+ PERO     Full                                                         +---------+---------------+---------+-----------+----------+--------------+   +---------+---------------+---------+-----------+----------+--------------+ LEFT     CompressibilityPhasicitySpontaneityPropertiesThrombus Aging +---------+---------------+---------+-----------+----------+--------------+ CFV      Full           Yes      Yes                                 +---------+---------------+---------+-----------+----------+--------------+ SFJ      Full                                                        +---------+---------------+---------+-----------+----------+--------------+  FV Prox  Full                                                        +---------+---------------+---------+-----------+----------+--------------+ FV Mid   Full                                                        +---------+---------------+---------+-----------+----------+--------------+ FV DistalFull                                                        +---------+---------------+---------+-----------+----------+--------------+ PFV      Full                                                        +---------+---------------+---------+-----------+----------+--------------+ POP      Full           Yes      Yes                                 +---------+---------------+---------+-----------+----------+--------------+ PTV      Full                                                        +---------+---------------+---------+-----------+----------+--------------+ PERO     Full                                                        +---------+---------------+---------+-----------+----------+--------------+    Summary: RIGHT: - There is no evidence of deep vein thrombosis in the lower extremity.  - No cystic structure found in the popliteal fossa.  - Ultrasound characteristics of enlarged lymph nodes are noted in the groin.  LEFT: - There is no evidence of  deep vein thrombosis in the lower extremity.  - No cystic structure found in the popliteal fossa.  *See table(s) above for measurements and observations.    Preliminary       Kelsey Durflinger T. [place]  If 7PM-7AM, please contact night-coverage [email or web address] 09/25/2020, 12:51 PM

## 2020-09-25 NOTE — Progress Notes (Signed)
[facility] for Infectious Disease    Date of Admission:  09/19/2020   Total days of antibiotics 7         ID: Steve [name] is a 65 y.o. male with  staph epi bacteremia in setting of recurrence of HL Principal Problem:   Failure to thrive in adult Active Problems:   Tracheostomy dependence ([name])   Chronic respiratory failure with hypoxia ([name])   Hodgkin's lymphoma ([place])   Essential hypertension   Bacteremia    Subjective: Remains on PRVC. Requiring vent support. Afebrile.  Medications:   acetylcysteine  4 mL Nebulization Q4H   Chlorhexidine Gluconate Cloth  6 each Topical Q0600   cholecalciferol  10,000 Units Oral Daily   enoxaparin (LOVENOX) injection  40 mg Subcutaneous Q24H   feeding supplement (PROSource TF)  45 mL Per Tube BID   hydrocortisone sod succinate (SOLU-CORTEF) inj  50 mg Intravenous Q8H   insulin aspart  0-9 Units Subcutaneous Q4H   insulin glargine  5 Units Subcutaneous Daily   mouth rinse  15 mL Mouth Rinse BID   midodrine  5 mg Per Tube TID WC   multivitamin  15 mL Per Tube Daily   pantoprazole sodium  40 mg Per Tube Daily   sertraline  50 mg Per Tube Daily   traZODone  100 mg Per Tube QHS    Objective: Vital signs in last 24 hours: Temp:  [98.2 F (36.8 C)-99.5 F (37.5 C)] 98.5 F (36.9 C) (06/17 0823) Pulse Rate:  [77-140] 79 (06/17 1100) Resp:  [18-41] 27 (06/17 1100) BP: (81-147)/(44-68) 94/51 (06/17 1100) SpO2:  [86 %-99 %] 98 % (06/17 1100) FiO2 (%):  [50 %-98 %] 50 % (06/17 0823) Physical Exam  Constitutional: He is oriented to person, place, and time. He appears chronically ill and malnourished. No distress.  HENT:  Mouth/Throat: Oropharynx is clear and moist. No oropharyngeal exudate. Trach in place Cardiovascular: Normal rate, regular rhythm and normal heart sounds. Exam reveals no gallop and no friction rub.  No murmur heard.  Pulmonary/Chest: Effort normal and breath sounds +rhonchi. Abdominal: Soft. Bowel sounds are  normal. He exhibits no distension. There is no tenderness.  Skin: Skin is warm and dry. No rash noted. No erythema.  Psychiatric: He has a normal mood and affect. His behavior is normal.    Lab Results Recent Labs    09/24/20 0534 09/25/20 0258  WBC 20.4* 16.9*  HGB 10.0* 8.7*  HCT 35.3* 29.9*  NA 141 141  K 4.7 4.3  CL 95* 93*  CO2 41* 41*  BUN 24* 25*  CREATININE 0.55* 0.47*   Liver Panel Recent Labs    09/24/20 0534 09/25/20 0258  PROT 6.4* 5.5*  ALBUMIN 2.2* 2.0*  AST 12* 13*  ALT 17 18  ALKPHOS 73 59  BILITOT 0.2* 0.4   Sedimentation Rate No results for input(s): ESRSEDRATE in the last 72 hours. C-Reactive Protein No results for input(s): CRP in the last 72 hours.  Microbiology: reviewed Studies/Results: CT Angio Chest Pulmonary Embolism (PE) W or WO Contrast  Result Date: 09/25/2020 CLINICAL DATA:  Cough and hypoxia.  COVID-19. EXAM: CT ANGIOGRAPHY CHEST WITH CONTRAST TECHNIQUE: Multidetector CT imaging of the chest was performed using the standard protocol during bolus administration of intravenous contrast. Multiplanar CT image reconstructions and MIPs were obtained to evaluate the vascular anatomy. CONTRAST:  30mL OMNIPAQUE IOHEXOL 350 MG/ML SOLN COMPARISON:  09/20/2020 FINDINGS: Cardiovascular: Contrast injection is sufficient to demonstrate satisfactory opacification of the  pulmonary arteries to the segmental level. There is no pulmonary embolus or evidence of right heart strain. The size of the main pulmonary artery is normal. Heart size is normal, with no pericardial effusion. The course and caliber of the aorta are normal. There is no atherosclerotic calcification. No acute aortic syndrome. Mediastinum/Nodes: Bulky mediastinal adenopathy is unchanged since the recent study of 09/20/2020. Lungs/Pleura: Tracheostomy tube tip is at the level of the clavicular heads. There is increased pleural fluid on the right. Diffuse pleural thickening is unchanged. Unchanged  appearance of diffuse parenchymal interstitial abnormality. Upper Abdomen: Contrast bolus timing is not optimized for evaluation of the abdominal organs. The visualized portions of the organs of the upper abdomen are normal. Musculoskeletal: No chest wall abnormality. No bony spinal canal stenosis. Review of the MIP images confirms the above findings. IMPRESSION: 1. No pulmonary embolus or acute aortic syndrome. 2. Bulky mediastinal adenopathy and diffuse pleural, interstitial and parenchymal pulmonary abnormalities are unchanged since 09/20/2020. Electronically Signed   By: Ulyses Jarred M.D.   On: 09/25/2020 02:54   DG CHEST PORT 1 VIEW  Result Date: 09/24/2020 CLINICAL DATA:  Respiratory distress EXAM: PORTABLE CHEST 1 VIEW COMPARISON:  09/19/2020 FINDINGS: Lung volumes are small with elevation of the right hemidiaphragm, stable since prior examination. Superimposed multifocal pulmonary infiltrates likely related to chronic underlying interstitial lung disease appears stable, better assessed on CT examination of 09/20/2020. Superimposed right pleural thickening is again noted. Tracheostomy unchanged. Cardiac size is enlarged with associated superior mediastinal widening again noted related to extensive mediastinal and pericardial adenopathy better demonstrated on prior CT examination. No acute bone abnormality. IMPRESSION: Stable diffuse pulmonary infiltrates likely related to chronic underlying interstitial lung disease and asymmetric right pleural thickening. Stable enlargement of the cardiomediastinal silhouette related to extensive mediastinal and pericardial adenopathy better appreciated on prior CT examination. Electronically Signed   By: Fidela Salisbury MD   On: 09/24/2020 11:41   VAS [place] LOWER EXTREMITY VENOUS (DVT)  Result Date: 09/24/2020  Lower Venous DVT Study Patient Name:  KACI FREEL [facility]  Date of Exam:   09/24/2020 Medical Rec #: [id number]          Accession #:    [id number] Date of Birth:  February 13, 1956           Patient Gender: M Patient Age:   64Y Exam Location:  [facility] Procedure:      VAS [place] LOWER EXTREMITY VENOUS (DVT) Referring Phys: [id number] MURALI RAMASWAMY --------------------------------------------------------------------------------  Indications: Post-Covid severe pulmonary fibrosis, relapsed Hodkin's lymphoma, edema.  Comparison       07-09-2019 Bilateral lower extremity venous was negative for Study:           DVT. Performing Technologist: Darlin Coco RDMS,RVT  Examination Guidelines: A complete evaluation includes B-mode imaging, spectral Doppler, color Doppler, and power Doppler as needed of all accessible portions of each vessel. Bilateral testing is considered an integral part of a complete examination. Limited examinations for reoccurring indications may be performed as noted. The reflux portion of the exam is performed with the patient in reverse Trendelenburg.  +---------+---------------+---------+-----------+----------+--------------+ RIGHT    CompressibilityPhasicitySpontaneityPropertiesThrombus Aging +---------+---------------+---------+-----------+----------+--------------+ CFV      Full           Yes      Yes                                 +---------+---------------+---------+-----------+----------+--------------+ SFJ  Full                                                        +---------+---------------+---------+-----------+----------+--------------+ FV Prox  Full                                                        +---------+---------------+---------+-----------+----------+--------------+ FV Mid   Full                                                        +---------+---------------+---------+-----------+----------+--------------+ FV DistalFull                                                        +---------+---------------+---------+-----------+----------+--------------+ PFV      Full                                                         +---------+---------------+---------+-----------+----------+--------------+ POP      Full           Yes      Yes                                 +---------+---------------+---------+-----------+----------+--------------+ PTV      Full                                                        +---------+---------------+---------+-----------+----------+--------------+ PERO     Full                                                        +---------+---------------+---------+-----------+----------+--------------+   +---------+---------------+---------+-----------+----------+--------------+ LEFT     CompressibilityPhasicitySpontaneityPropertiesThrombus Aging +---------+---------------+---------+-----------+----------+--------------+ CFV      Full           Yes      Yes                                 +---------+---------------+---------+-----------+----------+--------------+ SFJ      Full                                                        +---------+---------------+---------+-----------+----------+--------------+  FV Prox  Full                                                        +---------+---------------+---------+-----------+----------+--------------+ FV Mid   Full                                                        +---------+---------------+---------+-----------+----------+--------------+ FV DistalFull                                                        +---------+---------------+---------+-----------+----------+--------------+ PFV      Full                                                        +---------+---------------+---------+-----------+----------+--------------+ POP      Full           Yes      Yes                                 +---------+---------------+---------+-----------+----------+--------------+ PTV      Full                                                         +---------+---------------+---------+-----------+----------+--------------+ PERO     Full                                                        +---------+---------------+---------+-----------+----------+--------------+    Summary: RIGHT: - There is no evidence of deep vein thrombosis in the lower extremity.  - No cystic structure found in the popliteal fossa.  - Ultrasound characteristics of enlarged lymph nodes are noted in the groin.  LEFT: - There is no evidence of deep vein thrombosis in the lower extremity.  - No cystic structure found in the popliteal fossa.  *See table(s) above for measurements and observations.    Preliminary      Assessment/Plan: Respiratory distress  on vent support s/p trach = continue vent support as guided by RT and PCCM; PE ruled out but has underlying lung dysfunction/fibrosis contributing  Staph epi bacteremia = continue on vancomycin plan for minimum of 2 wk since last positive culture, may decide to extend further since unable to rule out endocarditis  Relapse HL = unclear if he would still be candidate for treatment given acute worsening of state of health during this hospitalization  Will see back on [place]  for Infectious Diseases Cell: [phone] Pager: [phone]  09/25/2020, 11:11 AM

## 2020-09-25 NOTE — Progress Notes (Signed)
Patient continues to be hospitalized. He was due to come into the office today for follow up. Current plan is to treat with modified ICE regimen once his infection is treated. Will continue to follow for post discharge follow up needs.  Oncology Nurse Navigator Documentation  Oncology Nurse Navigator Flowsheets 09/25/2020  Abnormal Finding Date -  Confirmed Diagnosis Date -  Diagnosis Status -  Planned Course of Treatment -  Phase of Treatment -  Chemotherapy Pending- Reason: -  Chemotherapy Actual Start Date: -  Navigator Follow Up Date: 10/02/2020  Navigator Follow Up Reason: Appointment Review  Navigator Location [facility]  Referral Date to RadOnc/MedOnc -  Navigator Encounter Type Appt/Treatment Plan Review  Telephone -  Treatment Initiated Date -  Patient Visit Type MedOnc  Treatment Phase Active Tx  Barriers/Navigation Needs Coordination of Care;Education;Employed;Family Concerns;Morbidities/Frailty;Multiple Hospital Admissions  Education -  Interventions None Required  Acuity Level 4-High Needs (Greater Than 4 Barriers Identified)  Referrals -  Coordination of Care -  Education Method -  Support Groups/Services Friends and Family  Time Spent with Patient 15

## 2020-09-25 NOTE — Progress Notes (Signed)
Nutrition Follow-up  DOCUMENTATION CODES:   Severe malnutrition in context of chronic illness  INTERVENTION:  - will adjust TF regimen to aid in glycemic control: Vital AF 1.2 @ 65 ml/hr with 100 ml free water QID. - this regimen will provide 1872 kcal (96% kcal need), 172 grams carbohydrate 117 grams protein, and 1665 ml free water.  - this regimen provides 74 less grams than regimen with Osmolite 1.2 - will monitor for ability to liberalize from the vent and possible need to adjust TF regimen moving forward.   NUTRITION DIAGNOSIS:   Severe Malnutrition related to chronic illness, cancer and cancer related treatments as evidenced by severe fat depletion, severe muscle depletion. 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  Patient was discharged from this hospital on 08/25/2020.  Patient was said to have done very well for the first 3 weeks.  Over the last 5 days, patient has been failing to thrive, with associated poor appetite and malaise.  Patient transferred from [place] to 2W yesterday at 67. Patient is on the vent via trach and was unsuccessful in transitioning to trach collar this AM. Confirmed with RN that he only has a G-port and that he is unable to eat while on the vent.   He is receiving TF at goal rate of Osmolite 1.2 @ 65 ml/hr with 45 ml Prosource TF BID and 30 ml free water every 4 hours. This regimen is providing 1952 kcal, 246 grams carbohydrate, 108 grams protein, and 1459 ml free water.   Noted CBGs today. Communicated with RN plan  to change TF regimen to determine if this assists with glycemic control.  He has not been weighed since admission on 6/11. No information documented in the edema section of flow sheet.      Patient is currently intubated on ventilator support MV: 12.2 L/min Temp (24hrs), Avg:98.7 F (37.1 C), Min:98.2 F (36.8 C), Max:99.5 F (37.5 C) Propofol: none BP: 111/51 and MAP: 71   Labs reviewed; CBGs: 309, 320, 312 mg/dl, Cl: 93 mmol/l, BUN: 25 mg/dl, creatinine: 0.47 mg/dl. Medications reviewed; 10000 units cholecalciferol/day, 50 mg solu-cortef TID, sliding scale novolog, 5 units lantus BID, 15 ml liquid multivitamin/day, 40 mg protonix per PEG/day, 10 mmol IV NaPhos x1 dose 6/16.   Diet Order:   Diet Order             Diet Carb Modified Fluid consistency: Thin; Room service appropriate? Yes  Diet effective now                   EDUCATION NEEDS:   Education needs have been addressed  Skin:  Skin Assessment: Reviewed RN Assessment  Last BM:  6/14  Height:   Ht Readings from Last 1 Encounters:  09/18/20 _0  (1.651 m)    Weight:   Wt Readings from Last 1 Encounters:  09/19/20 63.3 kg     Estimated Nutritional Needs:  Kcal:  1950-2150 Protein:  95-110g Fluid:  2L/day      Jarome Matin, MS, RD, LDN, CNSC Inpatient Clinical Dietitian RD pager # available  in AMION  After hours/weekend pager # available in [facility]

## 2020-09-25 NOTE — Progress Notes (Signed)
Patient transported to CT scan and back to room 1235 without incidence.

## 2020-09-25 NOTE — Progress Notes (Signed)
Rt try tp place pt back on TC this am. Pt sats went to 84%. Rt placed pt back on PS/vent. No distress noted at this time.

## 2020-09-25 NOTE — Progress Notes (Signed)
Inpatient Diabetes Program Recommendations  AACE/ADA: New Consensus Statement on Inpatient Glycemic Control (2015)  Target Ranges:  Prepandial:   less than 140 mg/dL      Peak postprandial:   less than 180 mg/dL (1-2 hours)      Critically ill patients:  140 - 180 mg/dL   Lab Results  Component Value Date   GLUCAP 320 (H) 09/25/2020   HGBA1C 7.2 (H) 09/18/2020    Review of Glycemic Control Results for CANDON, CARAS (MRN [id number]) as of 09/25/2020 09:11  Ref. Range 09/24/2020 07:59 09/24/2020 11:22 09/24/2020 16:11 09/24/2020 21:49 09/24/2020 23:33 09/25/2020 04:17 09/25/2020 07:37  Glucose-Capillary Latest Ref Range: 70 - 99 mg/dL 299 (H) 240 (H) 311 (H) 331 (H) 273 (H) 309 (H) 320 (H)    Diabetes history: DM 2 Outpatient Diabetes Medications: None Current orders for Inpatient glycemic control:  Novolog 0-9 units Q4 Lantus 5 units Daily  A1c 7.2% Solucortef 50 mg Q8 hours Osmolite 65 ml/hour  Inpatient Diabetes Program Recommendations:    -  Increase Lantus to 15 units -  Add Novolog 4 units Q4 hours Tube Feed Coverage  Thanks,  Tama Headings RN, MSN, BC-ADM Inpatient Diabetes Coordinator Team Pager [phone] (8a-5p)

## 2020-09-25 NOTE — Progress Notes (Signed)
Pharmacy Antibiotic Note  Steve [name] is a 65 y.o. male admitted on 09/19/2020 with possible pneumonia, found to have staph epidermidis bacteremia. Pharmacy has been consulted for Vancomycin and Cefepime dosing.  Day 6 full abx, no change SCr  6/14 levels Vancomycin peak = 37 Vancomycin trough = 10 Calculated AUC = 527 (goal 400-550)  Plan: Continue Vancomycin 750mg  q12h. Continue to monitor renal function, recheck levels as needed Cefepime 2g IV q8h.  Weight: 63.3 kg (139 lb 8.8 oz)  Temp (24hrs), Avg:98.6 F (37 C), Min:98.2 F (36.8 C), Max:99.5 F (37.5 C)  Recent Labs  Lab 09/19/20 1511 09/20/20 0708 09/20/20 1015 09/22/20 0536 09/22/20 1643 09/23/20 1026 09/24/20 0534 09/25/20 0258  WBC 19.0*  --  24.9* 17.2*  --  19.2* 20.4* 16.9*  CREATININE 0.43* 0.55*  --  0.51*  --  0.51* 0.55* 0.47*  LATICACIDVEN 0.7  --   --   --   --   --   --   --   VANCOTROUGH  --   --   --   --  10*  --   --   --   VANCOPEAK  --   --   --  37  --   --   --   --      Estimated Creatinine Clearance: 81.1 mL/min (A) (by C-G formula based on SCr of 0.47 mg/dL (L)).    No Known Allergies  Antimicrobials this admission: 6/11 Vancomycin >> 6/12, resumed 6/12 >> 6/11 Cefepime >> 6/15  Dose adjustments this admission: 6/14 0536 VP: 37, 1530 VT: 10  Microbiology results: 6/11 BCx: 4/4 bottles Staph epi (methicillin resistance detected)  6/11 UCx: multiple species - final Sputum:  ordered, not collected 6/11 Strep pneumo urinary antigen: neg 6/11 Legionella urinary antigen: neg 6/12 MRSA PCR: neg 6/14 BCx: NGTD  6/16 MRSA PCR: neg  Thank you for allowing pharmacy to be a part of this patient's care.  Minda Ditto PharmD 09/25/2020, 1:53 PM

## 2020-09-25 NOTE — Consult Note (Addendum)
NAME:  Steve [name], MRN:  [id number], DOB:  06-16-1955, LOS: 6 ADMISSION DATE:  09/19/2020, CONSULTATION DATE:  09/24/20 REFERRING MD:  Dr Nolberto Hanlon, CHIEF COMPLAINT:  Resp failure   History of Present Illness:   65 year old male who is well kwn to PCCM service from [place] for s/p covid, respiratoy failure, ARDS, pneumothorax, prolonged mechanical ventilation (? 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  At baseline.   Port-A-Cath placement in December 2020 and gastrostomy tube placement in May 2021.  Of note , has Hodgkins Lymphoma  with recuirrence March 2022 (Bulky intrathoracic adenopathy esp new substernal and newosseous metastatic disease, PET 06/19/2020.). As of April 2022 got 2nd dose Adcetris.  The admitted May 2022 for "pneumonia" and Acetris help (in part due to high LFT) and all Rx held.  Also as a preadmission problem: Chronic caxhexia, protein calorie malnutrition - moderate, failure thrive, DM all as his baseline prior to admission problems/ his baseline ECOG May 2022 was 2  Admitted 09/19/20 (recent baseline 8LO2 via trach). With worsening cough, lethargy.  Blood culture came back positive with Staphylococcus epidermis on 09/22/20 (all 4 of 4 cutuirs), infectious disease and s/p Port-A-Cath removal 09/22/20. As a present on admission problem O 09/22/20 - was needed 10L O2. CT scan this admit 09/20/2020 shwoed worrsening lymphadenopathy in chest concerning for Hodgkins progression. On 6/15 - Seen by Dr Marin Olp and recommended ICE protocol when better (highly immune suppressive regimen). ID had recommended 2 week Rx  . On 6/16 - reports he is more hypoxemic needeing >90% fio2 via Trach collar though no increased work of breathing. CCM consulted  Remains full code despite multiple conversations  Pertinent  Medical History  Covid admission March - May 2021    has a past medical history of Abscess of muscle (08/10/2011), Acute on chronic respiratory failure with hypoxia ([place]), Acute respiratory distress syndrome (ARDS) due to COVID-19 virus ([place]), Anxiety, Diabetes mellitus without complication ([place]), GERD (gastroesophageal reflux disease), HCAP (healthcare-associated pneumonia), Hip dysplasia, congenital, Hodgkin lymphoma ([place]), Hypertension, Pancreatitis, Pneumothorax, acute, Postoperative anemia due to acute blood loss (09/07/2012), Septic arthritis of hip ([place]) (09/05/2012), and Sleep apnea.   reports that he has never smoked. He has never used smokeless tobacco.  Past Surgical History:  Procedure Laterality Date   COLONOSCOPY  04/26/2007   HERNIA REPAIR     inguinal hernia x3   IR GASTROSTOMY TUBE MOD SED  08/22/2019   IR IMAGING GUIDED PORT INSERTION  03/29/2019   IR REMOVAL TUN ACCESS W/ PORT W/O FL MOD SED  09/22/2020   JOINT REPLACEMENT  2002 & 2007   bilateral hip replacement   LYMPH NODE BIOPSY Right 03/20/2019   Procedure: DEEP RIGHT SUPRACLAVICULAR LYMPH NODE EXCISION;  Surgeon: Fanny Skates, MD;  Location: [facility];  Service: General;  Laterality: Right;   MULTIPLE EXTRACTIONS WITH ALVEOLOPLASTY  07/28/2011   Procedure: MULTIPLE EXTRACION WITH ALVEOLOPLASTY;  Surgeon: Lenn Cal, DDS;  Location: [facility];  Service: Oral Surgery;  Laterality: N/A;  Extraction of tooth #'s 2,3,4,5,6,11,12,13,15,19,22 with alveoloplasty.   shoulder repair - right for separation of shoulder     TEE WITHOUT CARDIOVERSION  07/29/2011   Procedure: TRANSESOPHAGEAL ECHOCARDIOGRAM (TEE);  Surgeon: Josue Hector, MD;  Location: [facility];  Service: Cardiovascular;  Laterality: N/A;   TOTAL HIP REVISION Right 09/05/2012   Procedure: RIGHT HIP RESECTION ARTHROPLASTY WITH ANTIBIOTIC  SPACERS;  Surgeon: [name], MD;  Location: [facility];  Service: Orthopedics;  Laterality: Right;   TOTAL HIP REVISION Right 11/30/2012   Procedure: RIGHT TOTAL HIP  ARTHROPLASTY REIMPLANTATION;  Surgeon: [name], MD;  Location: [facility];  Service: Orthopedics;  Laterality: Right;    No Known Allergies  Immunization History  Administered Date(s) Administered   Influenza,inj,Quad PF,6+ Mos 01/23/2017, 02/08/2019, 05/27/2020   PFIZER(Purple Top)SARS-COV-2 Vaccination 04/01/2020, 08/06/2020   Pneumococcal Polysaccharide-23 05/27/2020   Td 05/18/2004   Tdap 11/10/2016    Family History  Problem Relation Age of Onset   Melanoma Mother    Heart attack Mother    Hyperlipidemia Neg Hx    Sudden death Neg Hx    Hypertension Neg Hx    Diabetes Neg Hx      Current Facility-Administered Medications:    acetylcysteine (MUCOMYST) 20 % nebulizer / oral solution 4 mL, 4 mL, Nebulization, Q4H, Amery, Sahar, MD, 4 mL at 09/25/20 0755   albuterol (PROVENTIL) (2.5 MG/3ML) 0.083% nebulizer solution 2.5 mg, 2.5 mg, Nebulization, Q2H PRN, Dana Allan I, MD, 2.5 mg at 09/25/20 0755   Chlorhexidine Gluconate Cloth 2 % PADS 6 each, 6 each, Topical, [id number], Nolberto Hanlon, MD, 6 each at 09/24/20 1118   cholecalciferol (VITAMIN D) tablet 10,000 Units, 10,000 Units, Oral, Daily, Minda Ditto, RPH, 10,000 Units at 09/25/20 1009   enoxaparin (LOVENOX) injection 40 mg, 40 mg, Subcutaneous, Q24H, Ogbata, Sylvester I, MD, 40 mg at 09/24/20 2213   feeding supplement (OSMOLITE 1.2 CAL) liquid 1,000 mL, 1,000 mL, Per Tube, Continuous, Dana Allan I, MD, Last Rate: 65 mL/hr at 09/24/20 1849, 1,000 mL at 09/25/20 0256   feeding supplement (PROSource TF) liquid 45 mL, 45 mL, Per Tube, BID, Wofford, Drew A, RPH, 45 mL at 09/25/20 1010   guaiFENesin (ROBITUSSIN) 100 MG/5ML solution 100 mg, 5 mL, Per Tube, Q4H PRN, Dana Allan I, MD   hydrocortisone sodium succinate (SOLU-CORTEF) 100 MG injection 50 mg, 50 mg, Intravenous, Q8H, Ogbata, Sylvester I, MD, 50 mg at 09/25/20 0608   hyoscyamine (LEVSIN) 0.125 MG/5ML elixir 0.125 mg, 0.125 mg, Per Tube, Q4H PRN, Gonfa,  Taye T, MD   insulin aspart (novoLOG) injection 0-15 Units, 0-15 Units, Subcutaneous, Q4H, Gonfa, Taye T, MD   insulin glargine (LANTUS) injection 5 Units, 5 Units, Subcutaneous, BID, Gonfa, Taye T, MD   LORazepam (ATIVAN) tablet 0.5 mg, 0.5 mg, Per Tube, Q6H PRN, Nyoka Cowden, Terri L, RPH   MEDLINE mouth rinse, 15 mL, Mouth Rinse, BID, Amery, Sahar, MD, 15 mL at 09/25/20 1011   midodrine (PROAMATINE) tablet 5 mg, 5 mg, Per Tube, TID WC, Green, Terri L, RPH   multivitamin liquid 15 mL, 15 mL, Per Tube, Daily, Dana Allan I, MD, 15 mL at 09/25/20 1009   pantoprazole sodium (PROTONIX) 40 mg/20 mL oral suspension 40 mg, 40 mg, Per Tube, Daily, Dana Allan I, MD, 40 mg at 09/25/20 1009   sertraline (ZOLOFT) tablet 50 mg, 50 mg, Per Tube, Daily, Green, Terri L, RPH, 50 mg at 09/25/20 1009   traZODone (DESYREL) tablet 100 mg, 100 mg, Per Tube, QHS, Dana Allan I, MD, 100 mg at 09/24/20 2220   vancomycin (VANCOREADY) IVPB 750 mg/150 mL, 750 mg, Intravenous, Q12H, [name], RPH, Stopped at 09/25/20 0545   Significant Hospital Events: Including procedures, antibiotic start and stop dates in addition to other pertinent events   6/16 - ccm consult  Interim History / Subjective:    6/17 -CT  angiogram ruled out pulmonary embolism.  He has progressive Hodgkin's disease on the CT scan of the chest.  He was unable to sustain facemask oxygen and has required being placed on pressure support ventilation through the tracheostomy.  He now has a cuffed 6 sized tracheostomy.  Currently on 50% oxygen and saturating well and more comfortable.  Objective   Blood pressure (!) 94/51, pulse 79, temperature 98.5 F (36.9 C), temperature source Oral, resp. rate (!) 27, weight 63.3 kg, SpO2 98 %.    Vent Mode: PSV;CPAP FiO2 (%):  [50 %-98 %] 50 % PEEP:  [5 cmH20] 5 cmH20 Pressure Support:  [20 cmH20] 20 cmH20   Intake/Output Summary (Last 24 hours) at 09/25/2020 1111 Last data filed at 09/25/2020  0645 Gross per 24 hour  Intake 3632.7 ml  Output 1575 ml  Net 2057.7 ml   Filed Weights   09/19/20 1930  Weight: 63.3 kg    Examination: Extremely frail male.  He has tracheostomy is on the ventilator through the tracheostomy 50% FiO2 synchronous with the ventilator.  Distant crackles present.  Currently sleeping.  Not on sedation.  He is able to eat through the tracheostomy.  Normal heart sounds abdomen soft.  Extreme muscular wasting present.   Labs/imaging that I havepersonally reviewed  (right click and "Reselect all SmartList Selections" daily)   LABS    PULMONARY Recent Labs  Lab 09/24/20 1230  PHART 7.350  PCO2ART 84.3*  PO2ART 41.3*  HCO3 45.3*  O2SAT 75.5    CBC Recent Labs  Lab 09/23/20 1026 09/24/20 0534 09/25/20 0258  HGB 9.9* 10.0* 8.7*  HCT 34.0* 35.3* 29.9*  WBC 19.2* 20.4* 16.9*  PLT 236 218 202    COAGULATION No results for input(s): INR in the last 168 hours.  CARDIAC  No results for input(s): TROPONINI in the last 168 hours. No results for input(s): PROBNP in the last 168 hours.   CHEMISTRY Recent Labs  Lab 09/20/20 0708 09/22/20 0536 09/23/20 1026 09/24/20 0534 09/25/20 0258  NA 135 138 140 141 141  K 4.7 4.1 4.7 4.7 4.3  CL 87* 92* 94* 95* 93*  CO2 43* 43* 42* 41* 41*  GLUCOSE 254* 326* 317* 391* 271*  BUN 25* 25* 23 24* 25*  CREATININE 0.55* 0.51* 0.51* 0.55* 0.47*  CALCIUM 9.6 9.3 9.4 9.7 9.0  MG 1.9 2.1  --  2.1  --   PHOS  --  2.7  --  2.3*  --    Estimated Creatinine Clearance: 81.1 mL/min (A) (by C-G formula based on SCr of 0.47 mg/dL (L)).   LIVER Recent Labs  Lab 09/19/20 1511 09/20/20 0708 09/23/20 1026 09/24/20 0534 09/25/20 0258  AST 15 15 11* 12* 13*  ALT 28 30 19 17 18   ALKPHOS 83 82 73 73 59  BILITOT 0.6 0.8 0.3 0.2* 0.4  PROT 6.3* 6.7 6.3* 6.4* 5.5*  ALBUMIN 2.3* 2.2* 2.1* 2.2* 2.0*     INFECTIOUS Recent Labs  Lab 09/19/20 1511 09/20/20 0708  LATICACIDVEN 0.7  --   PROCALCITON  --   <0.10     ENDOCRINE CBG (last 3)  Recent Labs    09/24/20 2333 09/25/20 0417 09/25/20 0737  GLUCAP 273* 309* 320*         IMAGING x48h  - image(s) personally visualized  -   highlighted in bold CT Angio Chest Pulmonary Embolism (PE) W or WO Contrast  Result Date: 09/25/2020 CLINICAL DATA:  Cough and hypoxia.  COVID-19. EXAM: CT ANGIOGRAPHY  CHEST WITH CONTRAST TECHNIQUE: Multidetector CT imaging of the chest was performed using the standard protocol during bolus administration of intravenous contrast. Multiplanar CT image reconstructions and MIPs were obtained to evaluate the vascular anatomy. CONTRAST:  29mL OMNIPAQUE IOHEXOL 350 MG/ML SOLN COMPARISON:  09/20/2020 FINDINGS: Cardiovascular: Contrast injection is sufficient to demonstrate satisfactory opacification of the pulmonary arteries to the segmental level. There is no pulmonary embolus or evidence of right heart strain. The size of the main pulmonary artery is normal. Heart size is normal, with no pericardial effusion. The course and caliber of the aorta are normal. There is no atherosclerotic calcification. No acute aortic syndrome. Mediastinum/Nodes: Bulky mediastinal adenopathy is unchanged since the recent study of 09/20/2020. Lungs/Pleura: Tracheostomy tube tip is at the level of the clavicular heads. There is increased pleural fluid on the right. Diffuse pleural thickening is unchanged. Unchanged appearance of diffuse parenchymal interstitial abnormality. Upper Abdomen: Contrast bolus timing is not optimized for evaluation of the abdominal organs. The visualized portions of the organs of the upper abdomen are normal. Musculoskeletal: No chest wall abnormality. No bony spinal canal stenosis. Review of the MIP images confirms the above findings. IMPRESSION: 1. No pulmonary embolus or acute aortic syndrome. 2. Bulky mediastinal adenopathy and diffuse pleural, interstitial and parenchymal pulmonary abnormalities are unchanged since  09/20/2020. Electronically Signed   By: Ulyses Jarred M.D.   On: 09/25/2020 02:54   DG CHEST PORT 1 VIEW  Result Date: 09/24/2020 CLINICAL DATA:  Respiratory distress EXAM: PORTABLE CHEST 1 VIEW COMPARISON:  09/19/2020 FINDINGS: Lung volumes are small with elevation of the right hemidiaphragm, stable since prior examination. Superimposed multifocal pulmonary infiltrates likely related to chronic underlying interstitial lung disease appears stable, better assessed on CT examination of 09/20/2020. Superimposed right pleural thickening is again noted. Tracheostomy unchanged. Cardiac size is enlarged with associated superior mediastinal widening again noted related to extensive mediastinal and pericardial adenopathy better demonstrated on prior CT examination. No acute bone abnormality. IMPRESSION: Stable diffuse pulmonary infiltrates likely related to chronic underlying interstitial lung disease and asymmetric right pleural thickening. Stable enlargement of the cardiomediastinal silhouette related to extensive mediastinal and pericardial adenopathy better appreciated on prior CT examination. Electronically Signed   By: Fidela Salisbury MD   On: 09/24/2020 11:41   VAS [place] LOWER EXTREMITY VENOUS (DVT)  Result Date: 09/24/2020  Lower Venous DVT Study Patient Name:  Steve [name] [facility]  Date of Exam:   09/24/2020 Medical Rec #: [id number]          Accession #:    [id number] Date of Birth: [birth year]           Patient Gender: M Patient Age:   64Y Exam Location:  [facility] Procedure:      VAS [place] LOWER EXTREMITY VENOUS (DVT) Referring Phys: [id number] Ashraf Mesta --------------------------------------------------------------------------------  Indications: Post-Covid severe pulmonary fibrosis, relapsed Hodkin's lymphoma, edema.  Comparison       07-09-2019 Bilateral lower extremity venous was negative for Study:           DVT. Performing Technologist: Darlin Coco RDMS,RVT  Examination Guidelines: A complete  evaluation includes B-mode imaging, spectral Doppler, color Doppler, and power Doppler as needed of all accessible portions of each vessel. Bilateral testing is considered an integral part of a complete examination. Limited examinations for reoccurring indications may be performed as noted. The reflux portion of the exam is performed with the patient in reverse Trendelenburg.  +---------+---------------+---------+-----------+----------+--------------+ RIGHT    CompressibilityPhasicitySpontaneityPropertiesThrombus Aging +---------+---------------+---------+-----------+----------+--------------+ CFV  Full           Yes      Yes                                 +---------+---------------+---------+-----------+----------+--------------+ SFJ      Full                                                        +---------+---------------+---------+-----------+----------+--------------+ FV Prox  Full                                                        +---------+---------------+---------+-----------+----------+--------------+ FV Mid   Full                                                        +---------+---------------+---------+-----------+----------+--------------+ FV DistalFull                                                        +---------+---------------+---------+-----------+----------+--------------+ PFV      Full                                                        +---------+---------------+---------+-----------+----------+--------------+ POP      Full           Yes      Yes                                 +---------+---------------+---------+-----------+----------+--------------+ PTV      Full                                                        +---------+---------------+---------+-----------+----------+--------------+ PERO     Full                                                         +---------+---------------+---------+-----------+----------+--------------+   +---------+---------------+---------+-----------+----------+--------------+ LEFT     CompressibilityPhasicitySpontaneityPropertiesThrombus Aging +---------+---------------+---------+-----------+----------+--------------+ CFV      Full           Yes      Yes                                 +---------+---------------+---------+-----------+----------+--------------+  SFJ      Full                                                        +---------+---------------+---------+-----------+----------+--------------+ FV Prox  Full                                                        +---------+---------------+---------+-----------+----------+--------------+ FV Mid   Full                                                        +---------+---------------+---------+-----------+----------+--------------+ FV DistalFull                                                        +---------+---------------+---------+-----------+----------+--------------+ PFV      Full                                                        +---------+---------------+---------+-----------+----------+--------------+ POP      Full           Yes      Yes                                 +---------+---------------+---------+-----------+----------+--------------+ PTV      Full                                                        +---------+---------------+---------+-----------+----------+--------------+ PERO     Full                                                        +---------+---------------+---------+-----------+----------+--------------+    Summary: RIGHT: - There is no evidence of deep vein thrombosis in the lower extremity.  - No cystic structure found in the popliteal fossa.  - Ultrasound characteristics of enlarged lymph nodes are noted in the groin.  LEFT: - There is no evidence of deep vein thrombosis  in the lower extremity.  - No cystic structure found in the popliteal fossa.  *See table(s) above for measurements and observations.    Preliminary      Resolved Hospital Problem list   x   ICD 10   Patient Active Problem List   Diagnosis Date Noted   Bacteremia 09/23/2020   Failure  to thrive in adult 09/19/2020   Sepsis due to pneumonia ([place]) 08/13/2020   Acute on chronic respiratory failure with hypoxia and hypercapnia ([place]) 07/21/2020   DM (diabetes mellitus), type 2 ([place]) 07/15/2020   Pneumonia 07/14/2020   Pulmonary fibrosis ([place]) 05/26/2020   Bronchiectasis ([place]) 05/26/2020   Acute hypercapnic respiratory failure ([place]) 05/26/2020   Acute on chronic respiratory failure with hypoxia ([place]) 05/25/2020   Uncontrolled type 2 diabetes mellitus with hyperglycemia, with long-term current use of insulin ([place]) 05/25/2020   Essential hypertension 05/25/2020   GERD without esophagitis 05/25/2020   Debility    Anxiety    Tracheostomy care [facility])    Pneumothorax on right 03/23/2020   Chronic respiratory failure with hypoxia ([name])    ARDS (adult respiratory distress syndrome) ([place])    Hodgkin's lymphoma ([place])    Pneumothorax, acute    Healthcare-associated pneumonia    COVID-19 virus infection    On mechanically assisted ventilation ([place])    Dysphagia    Pneumothorax    Subcutaneous air ([place])    Tracheostomy dependence ([place])    Pneumomediastinum ([place])    Acute respiratory distress syndrome (ARDS) due to COVID-19 virus ([place])    Pressure injury of skin 07/27/2019   Acute respiratory failure with hypoxemia ([name])    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[place])    Malnutrition of moderate degree 07/10/2019   HCAP (healthcare-associated pneumonia)    Hypoxia    Febrile neutropenia ([place]) 05/17/2019   COVID-19 05/17/2019   Hyponatremia 05/17/2019   Protein-calorie malnutrition, severe ([place]) 05/17/2019   Neutropenic fever ([place]) 05/16/2019   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   Chemotherapy-induced diarrhea 04/22/2019   Anemia due to antineoplastic chemotherapy 03/25/2019   Hodgkin's lymphoma ([place]) 02/25/2019   Abnormal CT scan, pancreas - tail area 01/31/2013   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   Septic arthritis of hip ([place]) 09/05/2012   Routine general medical examination at a health care facility 09/12/2011   Elbow pain 09/12/2011   Abscess of muscle 08/10/2011   H/O dental abscess 08/10/2011   Staphylococcus aureus bacteremia 07/27/2011   Abdominal pain, RLQ 09/02/2010   HYPOGONADISM, MALE 03/23/2007   SLEEP APNEA 03/23/2007      Assessment & Plan:  Baseline:  - Chronic hypoxemic respiratory failure 8 L oxygen need -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 - ID managing - Post admit worsening acute on chronic respriatory failure with worsening hypoxemia .  - PE/DVT ruled out. Needing vent support PSV  - likely sepsis medidated demand on compromised lungs  Plan - PSV as tolerated -> if needed escalate to [facility] - abx as below oer ID  - cefeopime 6/15 - 6/15  - vanc 6/11 >> - need goals of care.   - things are never going to improve      Best practice (right click and "Reselect all SmartList Selections" daily)  Per triad     ATTESTATION & SIGNATURE   The patient Steve [name] is critically ill with multiple organ systems failure and requires high complexity decision making for assessment and support, frequent evaluation and titration of therapies, application of advanced monitoring technologies and extensive interpretation of multiple databases.   Critical Care Time devoted to patient  care services described in this note  is  35  Minutes. This time reflects time of care of this signee Dr Brand Males. This critical care time does not reflect procedure time, or teaching time or supervisory time of PA/NP/Med student/Med Resident etc but could involve care discussion time     Dr. Brand Males, M.D., [facility].C.P Pulmonary and Critical Care Medicine Staff Physician [facility] Pager: [phone], If no answer or between  15:00h - 7:00h: call [phone]  09/25/2020 11:23 AM

## 2020-09-26 ENCOUNTER — Other Ambulatory Visit: Payer: Self-pay

## 2020-09-26 DIAGNOSIS — Z515 Encounter for palliative care: Secondary | ICD-10-CM

## 2020-09-26 DIAGNOSIS — C8112 Nodular sclerosis classical Hodgkin lymphoma, intrathoracic lymph nodes: Secondary | ICD-10-CM

## 2020-09-26 DIAGNOSIS — J9611 Chronic respiratory failure with hypoxia: Secondary | ICD-10-CM | POA: Diagnosis not present

## 2020-09-26 DIAGNOSIS — R7881 Bacteremia: Secondary | ICD-10-CM | POA: Diagnosis not present

## 2020-09-26 DIAGNOSIS — Z7189 Other specified counseling: Secondary | ICD-10-CM

## 2020-09-26 DIAGNOSIS — E873 Alkalosis: Secondary | ICD-10-CM | POA: Diagnosis not present

## 2020-09-26 DIAGNOSIS — E1165 Type 2 diabetes mellitus with hyperglycemia: Secondary | ICD-10-CM | POA: Diagnosis not present

## 2020-09-26 DIAGNOSIS — R627 Adult failure to thrive: Secondary | ICD-10-CM

## 2020-09-26 DIAGNOSIS — J9621 Acute and chronic respiratory failure with hypoxia: Secondary | ICD-10-CM | POA: Diagnosis not present

## 2020-09-26 DIAGNOSIS — D649 Anemia, unspecified: Secondary | ICD-10-CM | POA: Diagnosis not present

## 2020-09-26 LAB — CBC WITH DIFFERENTIAL/PLATELET
Abs Immature Granulocytes: 0.22 10*3/uL — ABNORMAL HIGH (ref 0.00–0.07)
Basophils Absolute: 0 10*3/uL (ref 0.0–0.1)
Basophils Relative: 0 %
Eosinophils Absolute: 0 10*3/uL (ref 0.0–0.5)
Eosinophils Relative: 0 %
HCT: 29.8 % — ABNORMAL LOW (ref 39.0–52.0)
Hemoglobin: 8.9 g/dL — ABNORMAL LOW (ref 13.0–17.0)
Immature Granulocytes: 1 %
Lymphocytes Relative: 6 %
Lymphs Abs: 1 10*3/uL (ref 0.7–4.0)
MCH: 28.7 pg (ref 26.0–34.0)
MCHC: 29.9 g/dL — ABNORMAL LOW (ref 30.0–36.0)
MCV: 96.1 fL (ref 80.0–100.0)
Monocytes Absolute: 1.1 10*3/uL — ABNORMAL HIGH (ref 0.1–1.0)
Monocytes Relative: 6 %
Neutro Abs: 15.4 10*3/uL — ABNORMAL HIGH (ref 1.7–7.7)
Neutrophils Relative %: 87 %
Platelets: 214 10*3/uL (ref 150–400)
RBC: 3.1 MIL/uL — ABNORMAL LOW (ref 4.22–5.81)
RDW: 17.9 % — ABNORMAL HIGH (ref 11.5–15.5)
WBC: 17.8 10*3/uL — ABNORMAL HIGH (ref 4.0–10.5)
nRBC: 0 % (ref 0.0–0.2)

## 2020-09-26 LAB — COMPREHENSIVE METABOLIC PANEL
ALT: 18 U/L (ref 0–44)
AST: 13 U/L — ABNORMAL LOW (ref 15–41)
Albumin: 2 g/dL — ABNORMAL LOW (ref 3.5–5.0)
Alkaline Phosphatase: 61 U/L (ref 38–126)
Anion gap: 5 (ref 5–15)
BUN: 24 mg/dL — ABNORMAL HIGH (ref 8–23)
CO2: 45 mmol/L — ABNORMAL HIGH (ref 22–32)
Calcium: 9.2 mg/dL (ref 8.9–10.3)
Chloride: 93 mmol/L — ABNORMAL LOW (ref 98–111)
Creatinine, Ser: 0.49 mg/dL — ABNORMAL LOW (ref 0.61–1.24)
GFR, Estimated: >60 mL/min (ref >60)
Glucose, Bld: 191 mg/dL — ABNORMAL HIGH (ref 70–99)
Potassium: 4.2 mmol/L (ref 3.5–5.1)
Sodium: 143 mmol/L (ref 135–145)
Total Bilirubin: 0.3 mg/dL (ref 0.3–1.2)
Total Protein: 5.7 g/dL — ABNORMAL LOW (ref 6.5–8.1)

## 2020-09-26 LAB — GLUCOSE, CAPILLARY
Glucose-Capillary: 127 mg/dL — ABNORMAL HIGH (ref 70–99)
Glucose-Capillary: 147 mg/dL — ABNORMAL HIGH (ref 70–99)
Glucose-Capillary: 188 mg/dL — ABNORMAL HIGH (ref 70–99)
Glucose-Capillary: 192 mg/dL — ABNORMAL HIGH (ref 70–99)
Glucose-Capillary: 196 mg/dL — ABNORMAL HIGH (ref 70–99)
Glucose-Capillary: 236 mg/dL — ABNORMAL HIGH (ref 70–99)
Glucose-Capillary: 274 mg/dL — ABNORMAL HIGH (ref 70–99)

## 2020-09-26 LAB — MAGNESIUM: Magnesium: 2.1 mg/dL (ref 1.7–2.4)

## 2020-09-26 LAB — PHOSPHORUS: Phosphorus: 2.7 mg/dL (ref 2.5–4.6)

## 2020-09-26 LAB — LACTATE DEHYDROGENASE: LDH: 211 U/L — ABNORMAL HIGH (ref 98–192)

## 2020-09-26 MED ORDER — INSULIN ASPART 100 UNIT/ML IJ SOLN
2.0000 [IU] | INTRAMUSCULAR | Status: DC
  Administered 2020-09-26 – 2020-09-28 (×13): 2 [IU] via SUBCUTANEOUS

## 2020-09-26 MED ORDER — INSULIN ASPART 100 UNIT/ML IJ SOLN
0.0000 [IU] | INTRAMUSCULAR | Status: DC
  Administered 2020-09-26: 8 [IU] via SUBCUTANEOUS
  Administered 2020-09-26: 2 [IU] via SUBCUTANEOUS
  Administered 2020-09-26: 3 [IU] via SUBCUTANEOUS
  Administered 2020-09-27: 8 [IU] via SUBCUTANEOUS
  Administered 2020-09-27: 3 [IU] via SUBCUTANEOUS
  Administered 2020-09-27: 5 [IU] via SUBCUTANEOUS
  Administered 2020-09-27: 3 [IU] via SUBCUTANEOUS
  Administered 2020-09-27: 5 [IU] via SUBCUTANEOUS
  Administered 2020-09-27: 3 [IU] via SUBCUTANEOUS
  Administered 2020-09-28: 5 [IU] via SUBCUTANEOUS
  Administered 2020-09-28: 2 [IU] via SUBCUTANEOUS
  Administered 2020-09-28 (×3): 3 [IU] via SUBCUTANEOUS
  Administered 2020-09-28: 5 [IU] via SUBCUTANEOUS
  Administered 2020-09-28: 3 [IU] via SUBCUTANEOUS
  Administered 2020-09-29: 5 [IU] via SUBCUTANEOUS
  Administered 2020-09-29: 2 [IU] via SUBCUTANEOUS
  Administered 2020-09-29: 3 [IU] via SUBCUTANEOUS
  Administered 2020-09-29: 5 [IU] via SUBCUTANEOUS
  Administered 2020-09-29 – 2020-09-30 (×2): 3 [IU] via SUBCUTANEOUS
  Administered 2020-09-30: 5 [IU] via SUBCUTANEOUS
  Administered 2020-09-30: 3 [IU] via SUBCUTANEOUS
  Administered 2020-10-01: 2 [IU] via SUBCUTANEOUS
  Administered 2020-10-01 – 2020-10-02 (×3): 3 [IU] via SUBCUTANEOUS
  Administered 2020-10-02: 8 [IU] via SUBCUTANEOUS
  Administered 2020-10-02 (×3): 3 [IU] via SUBCUTANEOUS
  Administered 2020-10-03 (×3): 2 [IU] via SUBCUTANEOUS
  Administered 2020-10-04 (×2): 3 [IU] via SUBCUTANEOUS
  Administered 2020-10-05 – 2020-10-07 (×6): 2 [IU] via SUBCUTANEOUS
  Administered 2020-10-07: 5 [IU] via SUBCUTANEOUS

## 2020-09-26 MED ORDER — INSULIN GLARGINE 100 UNIT/ML ~~LOC~~ SOLN
10.0000 [IU] | Freq: Two times a day (BID) | SUBCUTANEOUS | Status: DC
  Administered 2020-09-26 – 2020-09-29 (×6): 10 [IU] via SUBCUTANEOUS
  Filled 2020-09-26 (×6): qty 0.1

## 2020-09-26 NOTE — Progress Notes (Signed)
PROGRESS NOTE  OSMAN CALZADILLA MGQ:[id number] DOB: 05-26-1955   PCP: [name], PA-C  Patient is from: Home.  Lives with his wife.  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   On 09/24/2020, patient developed respiratory distress with increased oxygen requirement, and transferred to ICU and started on pressure support ventilation.   Subjective: Seen and examined earlier this morning.  No major events overnight of this morning.  No complaint this morning.  He denies pain, GI or UTI symptoms.  Remains on pressure support.   Objective: Vitals:   09/26/20 0700 09/26/20 0800 09/26/20 0808 09/26/20 0829  BP: (!) 100/49 (!) 97/54  (!) 97/54  Pulse: 75 76  (!) 105  Resp: (!) 22 (!) 33  (!) 33  Temp:   98.2 F (36.8 C)   TempSrc:   Axillary   SpO2: 95% 95%  92%  Weight:      Height:        Intake/Output Summary (Last 24 hours) at 09/26/2020 1212 Last data filed at 09/26/2020 0512 Gross per 24 hour  Intake 2586.01 ml  Output 2000 ml  Net 586.01 ml   Filed Weights   09/19/20 1930 09/26/20 0258  Weight: 63.3 kg 63.3 kg    Examination:  GENERAL: No apparent distress.  Nontoxic. HEENT: MMM.  Vision and hearing grossly intact.  NECK: Tracheostomy. RESP: On PS MV via trach.  No IWOB.  Fair aeration bilaterally. CVS:  RRR. Heart sounds normal.  ABD/GI/GU: BS+. Abd soft, NTND.  PEG tube in place. MSK/EXT:  Moves extremities.  Significant muscle mass and subcu fat loss. SKIN: no apparent skin lesion or wound NEURO: Awake and alert.  Oriented appropriately.  No apparent focal neuro deficit. PSYCH: Calm. Normal affect.   Procedures:  None  Microbiology summarized: [id number] and influenza PCR nonreactive. MRSA PCR screen negative. 6/11-blood culture with staph epidermis-MecA/C 6/14-blood cultures NGTD.  Assessment & Plan: Sepsis due to possible pneumonia/staph epidermis bacteremia: POA. Had Leukocytosis, tachycardia pia and tachycardia on presentation.  CXR showed possible recurrent pneumonia but difficult to interpret.  CT chest showed interval increase in bulk of mediastinal and axillary adenopathy.  Culture data as above.  Port a cath removed on 6/16. -IV cefepime 6/11-6/15 -IV vancomycin 6/11>>> for minimum of 2 weeks per ID. Could be extended  -ID recommended TEE to exclude endocarditis-to be coordinated with PCCM -Continue midodrine and stress dose steroid for blood pressure support   Hypotension likely arterial insufficiency from chronic steroid in the setting of sepsis. -On midodrine and stress dose steroid.   Acute on chronic hypoxic respiratory failure in patient with history of tracheostomy after COVID-19 infection and pulmonary fibrosis-on 8 L via trach at baseline.  CXR and CT chest as above. -On PS MV since 6/16 -PCCM managing. -SLP recommended regular diet.      Hodgkin's lymphoma: CT chest concerning for worsening LAD and progression. -Oncology following.     Uncontrolled DM-2 with hyperglycemia: Hyperglycemia likely due to steroid. Recent Labs  Lab 09/25/20 1529 09/25/20 2026 09/26/20 0014 09/26/20 0423 09/26/20 0752  GLUCAP 284* 287* 188*  192* 236*  -Continue SSI to moderate every 4 hours. -Added NovoLog 2 units every 4 hours -Increase Lantus from 5 to 10 units twice daily -Further adjustment as appropriate  Normocytic anemia/anemia of chronic disease: Anemia panel consistent with this. Recent Labs    08/24/20 0510 09/08/20 1140 09/18/20 1212 09/19/20 1511 09/20/20 1015  09/22/20 0536 09/23/20 1026 09/24/20 0534 09/25/20 0258 09/26/20 0304  HGB 11.6* 11.3* 10.8* 10.2* 10.2* 9.1* 9.9* 10.0* 8.7* 8.9*  -Monitor.    Hyponatremia: Resolved.  Metabolic alkalosis: CO2 retention?  He is on mechanical ventilation. -Continue monitoring  Leukocytosis/bandemia: Likely due to sepsis on steroid. -Monitor  Severe protein calorie malnutrition: POA. Body mass index is 23.22 kg/m. Nutrition Problem: Severe Malnutrition Etiology: chronic illness, cancer and cancer related treatments Signs/Symptoms: severe fat depletion, severe muscle depletion Interventions: Tube feeding, Prostat, MVI   enoxaparin (LOVENOX) injection 40 mg Start: 09/19/20 2200  Code Status: Full code Family Communication: Patient and/or RN. Available if any question.  Level of care: ICU Status is: Inpatient  Remains inpatient appropriate because:Hemodynamically unstable, Unsafe d/c plan, IV treatments appropriate due to intensity of illness or inability to take PO, and Inpatient level of care appropriate due to severity of illness  Dispo: The patient is from: Home              Anticipated d/c is to: Home              Patient currently is not medically stable to d/c.   Difficult to place patient No       Consultants:  Pulmonology Hematology/oncology   Sch Meds:  Scheduled Meds:  Chlorhexidine Gluconate Cloth  6 each Topical Q0600   cholecalciferol  10,000 Units Oral Daily   enoxaparin (LOVENOX) injection  40 mg Subcutaneous Q24H   free water  100 mL Per Tube Q6H   hydrocortisone sod succinate (SOLU-CORTEF) inj  50 mg Intravenous Q8H   insulin aspart  0-15 Units Subcutaneous Q4H   insulin glargine  5 Units Subcutaneous BID   mouth rinse  15 mL Mouth Rinse 10 times per day   midodrine  5 mg Per Tube TID WC   multivitamin  15 mL Per Tube Daily   pantoprazole sodium  40 mg Per Tube Daily   sertraline  50 mg Per Tube Daily   traZODone  100 mg Per Tube QHS   Continuous  Infusions:  feeding supplement (VITAL AF 1.2 CAL) 1,000 mL (09/26/20 1140)   vancomycin Stopped (09/26/20 0559)   PRN Meds:.albuterol, guaiFENesin, hyoscyamine, LORazepam  Antimicrobials: Anti-infectives (From admission, onward)    Start     Dose/Rate Route Frequency Ordered Stop   09/23/20 0800  ceFEPIme (MAXIPIME) 2 g in sodium chloride 0.9 % 100 mL IVPB  Status:  Discontinued        2 g 200 mL/hr over 30 Minutes Intravenous Every 8 hours 09/23/20 0738 09/23/20 1758   09/21/20 0400  vancomycin (VANCOREADY) IVPB 750 mg/150 mL        750 mg 150 mL/hr over 60 Minutes Intravenous Every 12 hours 09/20/20 1715     09/20/20 1730  vancomycin (VANCOREADY) IVPB 750 mg/150 mL        750 mg 150 mL/hr over 60 Minutes Intravenous STAT 09/20/20 1714 09/20/20 1954   09/20/20 0400  vancomycin (VANCOREADY) IVPB 750 mg/150 mL  Status:  Discontinued        750 mg 150 mL/hr over 60 Minutes Intravenous Every 12 hours 09/19/20 1932 09/20/20 1155  09/20/20 0000  ceFEPIme (MAXIPIME) 2 g in sodium chloride 0.9 % 100 mL IVPB  Status:  Discontinued        2 g 200 mL/hr over 30 Minutes Intravenous Every 8 hours 09/19/20 1932 09/23/20 0721   09/19/20 1600  ceFEPIme (MAXIPIME) 2 g in sodium chloride 0.9 % 100 mL IVPB        2 g 200 mL/hr over 30 Minutes Intravenous  Once 09/19/20 1548 09/19/20 1634   09/19/20 1600  vancomycin (VANCOREADY) IVPB 1250 mg/250 mL        1,250 mg 166.7 mL/hr over 90 Minutes Intravenous STAT 09/19/20 1557 09/19/20 1822        I have personally reviewed the following labs and images: CBC: Recent Labs  Lab 09/20/20 1015 09/22/20 0536 09/23/20 1026 09/24/20 0534 09/25/20 0258 09/26/20 0304  WBC 24.9* 17.2* 19.2* 20.4* 16.9* 17.8*  NEUTROABS 21.8*  --  16.0* 16.4* 13.8* 15.4*  HGB 10.2* 9.1* 9.9* 10.0* 8.7* 8.9*  HCT 33.3* 29.8* 34.0* 35.3* 29.9* 29.8*  MCV 93.5 97.1 99.7 102.0* 99.0 96.1  PLT 314 237 236 218 202 214   BMP &GFR Recent Labs  Lab 09/20/20 0708  09/22/20 0536 09/23/20 1026 09/24/20 0534 09/25/20 0258 09/26/20 0304  NA 135 138 140 141 141 143  K 4.7 4.1 4.7 4.7 4.3 4.2  CL 87* 92* 94* 95* 93* 93*  CO2 43* 43* 42* 41* 41* 45*  GLUCOSE 254* 326* 317* 391* 271* 191*  BUN 25* 25* 23 24* 25* 24*  CREATININE 0.55* 0.51* 0.51* 0.55* 0.47* 0.49*  CALCIUM 9.6 9.3 9.4 9.7 9.0 9.2  MG 1.9 2.1  --  2.1  --  2.1  PHOS  --  2.7  --  2.3*  --  2.7   Estimated Creatinine Clearance: 81.1 mL/min (A) (by C-G formula based on SCr of 0.49 mg/dL (L)). Liver & Pancreas: Recent Labs  Lab 09/20/20 0708 09/23/20 1026 09/24/20 0534 09/25/20 0258 09/26/20 0304  AST 15 11* 12* 13* 13*  ALT 30 19 17 18 18   ALKPHOS 82 73 73 59 61  BILITOT 0.8 0.3 0.2* 0.4 0.3  PROT 6.7 6.3* 6.4* 5.5* 5.7*  ALBUMIN 2.2* 2.1* 2.2* 2.0* 2.0*   No results for input(s): LIPASE, AMYLASE in the last 168 hours. No results for input(s): AMMONIA in the last 168 hours. Diabetic: No results for input(s): HGBA1C in the last 72 hours. Recent Labs  Lab 09/25/20 1529 09/25/20 2026 09/26/20 0014 09/26/20 0423 09/26/20 0752  GLUCAP 284* 287* 188* 192* 236*   Cardiac Enzymes: Recent Labs  Lab 09/19/20 1511  CKTOTAL 11*   No results for input(s): PROBNP in the last 8760 hours. Coagulation Profile: No results for input(s): INR, PROTIME in the last 168 hours. Thyroid Function Tests: No results for input(s): TSH, T4TOTAL, FREET4, T3FREE, THYROIDAB in the last 72 hours. Lipid Profile: No results for input(s): CHOL, HDL, LDLCALC, TRIG, CHOLHDL, LDLDIRECT in the last 72 hours. Anemia Panel: No results for input(s): VITAMINB12, FOLATE, FERRITIN, TIBC, IRON, RETICCTPCT in the last 72 hours.  Urine analysis:    Component Value Date/Time   COLORURINE YELLOW 09/19/2020 1511   APPEARANCEUR HAZY (A) 09/19/2020 1511   LABSPEC 1.012 09/19/2020 1511   PHURINE 7.0 09/19/2020 1511   GLUCOSEU NEGATIVE 09/19/2020 1511   HGBUR NEGATIVE 09/19/2020 1511   HGBUR negative  03/23/2007 0000   BILIRUBINUR NEGATIVE 09/19/2020 1511   BILIRUBINUR neg 11/11/2016 1017   KETONESUR NEGATIVE 09/19/2020 1511   PROTEINUR NEGATIVE  09/19/2020 1511   UROBILINOGEN 0.2 11/11/2016 1017   UROBILINOGEN 0.2 11/08/2012 0915   NITRITE NEGATIVE 09/19/2020 1511   LEUKOCYTESUR NEGATIVE 09/19/2020 1511   Sepsis Labs: Invalid input(s): PROCALCITONIN, [place]  Microbiology: Recent Results (from the past 240 hour(s))  Urine culture     Status: Abnormal   Collection Time: 09/19/20  3:11 PM   Specimen: Urine, Clean Catch  Result Value Ref Range Status   Specimen Description   Final    URINE, CLEAN CATCH Performed at [facility], [place] [address], [place], [place] [address]    Special Requests   Final    NONE Performed at [facility], [place] [address], [place], [place] [address]    Culture MULTIPLE SPECIES PRESENT, SUGGEST RECOLLECTION (A)  Final   Report Status 09/21/2020 FINAL  Final  Culture, blood (routine x 2)     Status: Abnormal   Collection Time: 09/19/20  3:11 PM   Specimen: BLOOD  Result Value Ref Range Status   Specimen Description   Final    BLOOD PORTA CATH Performed at [facility] [address] [place], [place] [address]    Special Requests   Final    BOTTLES DRAWN AEROBIC AND ANAEROBIC Blood Culture results may not be optimal due to an excessive volume of blood received in culture bottles Performed at [facility] [place] [address] [place], [place] [address]    Culture  Setup Time   Final    GRAM POSITIVE COCCI IN BOTH AEROBIC AND ANAEROBIC BOTTLES CRITICAL RESULT CALLED TO, READ BACK BY AND VERIFIED WITH: M,SWAYNE PHARMD @1552  09/20/20 EB Performed at [facility] [address] [place], [place] [address]    Culture STAPHYLOCOCCUS EPIDERMIDIS (A)  Final   Report Status 09/22/2020 FINAL  Final   Organism ID, Bacteria STAPHYLOCOCCUS EPIDERMIDIS  Final       Susceptibility   Staphylococcus epidermidis - MIC*    CIPROFLOXACIN >=8 RESISTANT Resistant     ERYTHROMYCIN >=8 RESISTANT Resistant     GENTAMICIN 8 INTERMEDIATE Intermediate     OXACILLIN >=4 RESISTANT Resistant     TETRACYCLINE >=16 RESISTANT Resistant     VANCOMYCIN 1 SENSITIVE Sensitive     TRIMETH/SULFA 80 RESISTANT Resistant     CLINDAMYCIN >=8 RESISTANT Resistant     RIFAMPIN <=0.5 SENSITIVE Sensitive     Inducible Clindamycin NEGATIVE Sensitive     * STAPHYLOCOCCUS EPIDERMIDIS  Blood Culture ID Panel (Reflexed)     Status: Abnormal   Collection Time: 09/19/20  3:11 PM  Result Value Ref Range Status   Enterococcus faecalis NOT DETECTED NOT DETECTED Final   Enterococcus Faecium NOT DETECTED NOT DETECTED Final   Listeria monocytogenes NOT DETECTED NOT DETECTED Final   Staphylococcus species DETECTED (A) NOT DETECTED Final    Comment: CRITICAL RESULT CALLED TO, READ BACK BY AND VERIFIED WITH: M,SWAYNE PHARMD @1552  09/20/20 EB    Staphylococcus aureus (BCID) NOT DETECTED NOT DETECTED Final   Staphylococcus epidermidis DETECTED (A) NOT DETECTED Final    Comment: Methicillin (oxacillin) resistant coagulase negative staphylococcus. Possible blood culture contaminant (unless isolated from more than one blood culture draw or clinical case suggests pathogenicity). No antibiotic treatment is indicated for blood  culture contaminants. CRITICAL RESULT CALLED TO, READ BACK BY AND VERIFIED WITH: M,SWAYNE PHARMD @1552  09/20/20 EB    Staphylococcus lugdunensis NOT DETECTED NOT DETECTED Final   Streptococcus species NOT DETECTED NOT DETECTED Final   Streptococcus agalactiae NOT DETECTED  NOT DETECTED Final   Streptococcus pneumoniae NOT DETECTED NOT DETECTED Final   Streptococcus pyogenes NOT DETECTED NOT DETECTED Final   A.calcoaceticus-baumannii NOT DETECTED NOT DETECTED Final   Bacteroides fragilis NOT DETECTED NOT DETECTED Final   Enterobacterales NOT DETECTED NOT DETECTED Final    Enterobacter cloacae complex NOT DETECTED NOT DETECTED Final   Escherichia coli NOT DETECTED NOT DETECTED Final   Klebsiella aerogenes NOT DETECTED NOT DETECTED Final   Klebsiella oxytoca NOT DETECTED NOT DETECTED Final   Klebsiella pneumoniae NOT DETECTED NOT DETECTED Final   Proteus species NOT DETECTED NOT DETECTED Final   Salmonella species NOT DETECTED NOT DETECTED Final   Serratia marcescens NOT DETECTED NOT DETECTED Final   Haemophilus influenzae NOT DETECTED NOT DETECTED Final   Neisseria meningitidis NOT DETECTED NOT DETECTED Final   Pseudomonas aeruginosa NOT DETECTED NOT DETECTED Final   Stenotrophomonas maltophilia NOT DETECTED NOT DETECTED Final   Candida albicans NOT DETECTED NOT DETECTED Final   Candida auris NOT DETECTED NOT DETECTED Final   Candida glabrata NOT DETECTED NOT DETECTED Final   Candida krusei NOT DETECTED NOT DETECTED Final   Candida parapsilosis NOT DETECTED NOT DETECTED Final   Candida tropicalis NOT DETECTED NOT DETECTED Final   Cryptococcus neoformans/gattii NOT DETECTED NOT DETECTED Final   Methicillin resistance mecA/C DETECTED (A) NOT DETECTED Final    Comment: CRITICAL RESULT CALLED TO, READ BACK BY AND VERIFIED WITH: M,SWAYNE PHARMD @1552  09/20/20 EB Performed at [facility], [address] [place], [place] [address]   Culture, blood (routine x 2)     Status: Abnormal   Collection Time: 09/19/20  3:16 PM   Specimen: BLOOD  Result Value Ref Range Status   Specimen Description   Final    BLOOD SITE NOT SPECIFIED Performed at [facility] [address], [place], [place] [address]    Special Requests   Final    BOTTLES DRAWN AEROBIC AND ANAEROBIC Blood Culture adequate volume Performed at [facility] [address] [place], [place] [address]    Culture  Setup Time   Final    GRAM POSITIVE COCCI IN BOTH AEROBIC AND ANAEROBIC BOTTLES CRITICAL VALUE NOTED.  VALUE IS CONSISTENT WITH PREVIOUSLY  REPORTED AND CALLED VALUE.    Culture (A)  Final    STAPHYLOCOCCUS EPIDERMIDIS SUSCEPTIBILITIES PERFORMED ON PREVIOUS CULTURE WITHIN THE LAST 5 DAYS. Performed at [facility], [place] [address] [place], [place] [address]    Report Status 09/22/2020 FINAL  Final  Resp Panel by RT-PCR (Flu A&B, Covid) Nasopharyngeal Swab     Status: None   Collection Time: 09/19/20  3:49 PM   Specimen: Nasopharyngeal Swab; Nasopharyngeal(NP) swabs in vial transport medium  Result Value Ref Range Status   SARS Coronavirus 2 by RT PCR NEGATIVE NEGATIVE Final    Comment: (NOTE) SARS-CoV-2 target nucleic acids are NOT DETECTED.  The SARS-CoV-2 RNA is generally detectable in upper respiratory specimens during the acute phase of infection. The lowest concentration of SARS-CoV-2 viral copies this assay can detect is 138 copies/mL. A negative result does not preclude SARS-Cov-2 infection and should not be used as the sole basis for treatment or other patient management decisions. A negative result may occur with  improper specimen collection/handling, submission of specimen other than nasopharyngeal swab, presence of viral mutation(s) within the areas targeted by this assay, and inadequate number of viral copies(<138 copies/mL). A negative result must be combined with clinical observations, patient history, and epidemiological information.  The expected result is Negative.  Fact Sheet for Patients:  EntrepreneurPulse.com.au  Fact Sheet for Healthcare Providers:  IncredibleEmployment.be  This test is no t yet approved or cleared by the [place] FDA and  has been authorized for detection and/or diagnosis of SARS-CoV-2 by FDA under an Emergency Use Authorization (EUA). This EUA will remain  in effect (meaning this test can be used) for the duration of the COVID-19 declaration under Section 564(b)(1) of the Act, 21 U.S.C.section 360bbb-3(b)(1), unless the  authorization is terminated  or revoked sooner.       Influenza A by PCR NEGATIVE NEGATIVE Final   Influenza B by PCR NEGATIVE NEGATIVE Final    Comment: (NOTE) The Xpert Xpress SARS-CoV-2/FLU/RSV plus assay is intended as an aid in the diagnosis of influenza from Nasopharyngeal swab specimens and should not be used as a sole basis for treatment. Nasal washings and aspirates are unacceptable for Xpert Xpress SARS-CoV-2/FLU/RSV testing.  Fact Sheet for Patients: EntrepreneurPulse.com.au  Fact Sheet for Healthcare Providers: IncredibleEmployment.be  This test is not yet approved or cleared by the [place] FDA and has been authorized for detection and/or diagnosis of SARS-CoV-2 by FDA under an Emergency Use Authorization (EUA). This EUA will remain in effect (meaning this test can be used) for the duration of the COVID-19 declaration under Section 564(b)(1) of the Act, 21 U.S.C. section 360bbb-3(b)(1), unless the authorization is terminated or revoked.  Performed at [facility], [place] [address] [place], [place] [address]   MRSA PCR Screening     Status: None   Collection Time: 09/20/20  6:50 AM   Specimen: Nasopharyngeal  Result Value Ref Range Status   MRSA by PCR NEGATIVE NEGATIVE Final    Comment:        The GeneXpert MRSA Assay (FDA approved for NASAL specimens only), is one component of a comprehensive MRSA colonization surveillance program. It is not intended to diagnose MRSA infection nor to guide or monitor treatment for MRSA infections. Performed at [facility], [place] [address], [place], [place] [address]   Culture, blood (Routine X 2) w Reflex to ID Panel     Status: None (Preliminary result)   Collection Time: 09/22/20  7:06 PM   Specimen: BLOOD  Result Value Ref Range Status   Specimen Description   Final    BLOOD RIGHT ANTECUBITAL Performed at [facility] [address], [place], [place] [address]    Special Requests   Final    BOTTLES DRAWN AEROBIC AND ANAEROBIC Blood Culture adequate volume Performed at [facility] [address] [place], [place] [address]    Culture   Final    NO GROWTH 3 DAYS Performed at [facility], [place] [address] [place], [place] [address]    Report Status PENDING  Incomplete  Culture, blood (Routine X 2) w Reflex to ID Panel     Status: None (Preliminary result)   Collection Time: 09/22/20  7:06 PM   Specimen: BLOOD RIGHT HAND  Result Value Ref Range Status   Specimen Description   Final    BLOOD RIGHT HAND Performed at [facility] [address] [place], [place] [address]    Special Requests   Final    BOTTLES DRAWN AEROBIC ONLY Blood Culture adequate volume Performed at [facility] [address], [place], [place] [address]    Culture   Final    NO GROWTH 3 DAYS Performed  at [facility], [place] [address] [place], [place] [address]    Report Status PENDING  Incomplete  MRSA PCR Screening     Status: None   Collection Time: 09/24/20 10:47 AM  Result Value Ref Range Status   MRSA by PCR NEGATIVE NEGATIVE Final    Comment:        The GeneXpert MRSA Assay (FDA approved for NASAL specimens only), is one component of a comprehensive MRSA colonization surveillance program. It is not intended to diagnose MRSA infection nor to guide or monitor treatment for MRSA infections. Performed at [facility], [place] [address], [place], [place] [address]     Radiology Studies: No results found.    [place]ynn [place]  If 7PM-7AM, please contact night-coverage [email or web address] 09/26/2020, 12:12 PM

## 2020-09-26 NOTE — Progress Notes (Signed)
NAME:  Steve [name], MRN:  [id number], DOB:  Nov 06, 1955, LOS: 7 ADMISSION DATE:  09/19/2020, CONSULTATION DATE:  09/24/20 REFERRING MD:  Dr Nolberto Hanlon, CHIEF COMPLAINT:  Resp failure   BRIEF   65 year old male who is well kwn to PCCM service from [place] 2021 for s/p covid, respiratoy failure, ARDS, pneumothorax, prolonged mechanical ventilation (? 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  At baseline.   Port-A-Cath placement in December 2020 and gastrostomy tube placement in May 2021.  Of note , has Hodgkins Lymphoma  with recuirrence March 2022 (Bulky intrathoracic adenopathy esp new substernal and newosseous metastatic disease, PET 06/19/2020.). As of April 2022 got 2nd dose Adcetris.  The admitted May 2022 for "pneumonia" and Acetris help (in part due to high LFT) and all Rx held.  Also as a preadmission problem: Chronic caxhexia, protein calorie malnutrition - moderate, failure thrive, DM all as his baseline prior to admission problems/ his baseline ECOG May 2022 was 2  Admitted 09/19/20 (recent baseline 8LO2 via trach). With worsening cough, lethargy.  Blood culture came back positive with Staphylococcus epidermis on 09/22/20 (all 4 of 4 cutuirs), infectious disease and s/p Port-A-Cath removal 09/22/20. As a present on admission problem O 09/22/20 - was needed 10L O2. CT scan this admit 09/20/2020 shwoed worrsening lymphadenopathy in chest concerning for Hodgkins progression. On 6/15 - Seen by Dr Marin Olp and recommended ICE protocol when better (highly immune suppressive regimen). ID had recommended 2 week Rx  . On 6/16 - reports he is more hypoxemic needeing >90% fio2 via Trach collar though no increased work of breathing. CCM consulted  Remains full code despite multiple conversations  Pertinent  Medical History  Covid admission March - May 2021   Significant Hospital  Events: Including procedures, antibiotic start and stop dates in addition to other pertinent events   6/16 - ccm consult  6/17 -CT angiogram ruled out pulmonary embolism.  He has progressive Hodgkin's disease on the CT scan of the chest.  He was unable to sustain facemask oxygen and has required being placed on pressure support ventilation through the tracheostomy.  He now has a cuffed 6 sized tracheostomy.  Currently on 50% oxygen and saturating well and more comfortable.  Interim History / Subjective:   6/18 -stuck on pressure support ventilator at 40% FiO2.  Unable to wean off.  He is watching TV and is able to eat Objective   Blood pressure 116/70, pulse 92, temperature 98.4 F (36.9 C), temperature source Axillary, resp. rate (!) 35, height 5\' 5"  (1.651 m), weight 63.3 kg, SpO2 95 %.    Vent Mode: PSV;CPAP FiO2 (%):  [40 %] 40 % PEEP:  [5 cmH20] 5 cmH20 Pressure Support:  [20 cmH20] 20 cmH20 Plateau Pressure:  [20 cmH20] 20 cmH20   Intake/Output Summary (Last 24 hours) at 09/26/2020 1412 Last data filed at 09/26/2020 1319 Gross per 24 hour  Intake 3363.02 ml  Output 2000 ml  Net 1363.02 ml   Filed Weights   09/19/20 1930 09/26/20 0258  Weight: 63.3 kg 63.3 kg    Examination: Frail cachectic asthenic male with significant muscular wasting.  Has tracheostomy ventilator 40% pressure support ventilation.  Watching TV comfortable.  Anterior chest exam without crackles.  Normal heart sounds.  Abdomen soft.  No sinus no clubbing no edema.   Labs/imaging that I havepersonally reviewed  (right click and "Reselect all SmartList  Selections" daily)   LABS    PULMONARY Recent Labs  Lab 09/24/20 1230  PHART 7.350  PCO2ART 84.3*  PO2ART 41.3*  HCO3 45.3*  O2SAT 75.5    CBC Recent Labs  Lab 09/24/20 0534 09/25/20 0258 09/26/20 0304  HGB 10.0* 8.7* 8.9*  HCT 35.3* 29.9* 29.8*  WBC 20.4* 16.9* 17.8*  PLT 218 202 214    COAGULATION No results for input(s): INR in the  last 168 hours.  CARDIAC  No results for input(s): TROPONINI in the last 168 hours. No results for input(s): PROBNP in the last 168 hours.   CHEMISTRY Recent Labs  Lab 09/20/20 0708 09/22/20 0536 09/23/20 1026 09/24/20 0534 09/25/20 0258 09/26/20 0304  NA 135 138 140 141 141 143  K 4.7 4.1 4.7 4.7 4.3 4.2  CL 87* 92* 94* 95* 93* 93*  CO2 43* 43* 42* 41* 41* 45*  GLUCOSE 254* 326* 317* 391* 271* 191*  BUN 25* 25* 23 24* 25* 24*  CREATININE 0.55* 0.51* 0.51* 0.55* 0.47* 0.49*  CALCIUM 9.6 9.3 9.4 9.7 9.0 9.2  MG 1.9 2.1  --  2.1  --  2.1  PHOS  --  2.7  --  2.3*  --  2.7   Estimated Creatinine Clearance: 81.1 mL/min (A) (by C-G formula based on SCr of 0.49 mg/dL (L)).   LIVER Recent Labs  Lab 09/20/20 0708 09/23/20 1026 09/24/20 0534 09/25/20 0258 09/26/20 0304  AST 15 11* 12* 13* 13*  ALT 30 19 17 18 18   ALKPHOS 82 73 73 59 61  BILITOT 0.8 0.3 0.2* 0.4 0.3  PROT 6.7 6.3* 6.4* 5.5* 5.7*  ALBUMIN 2.2* 2.1* 2.2* 2.0* 2.0*     INFECTIOUS Recent Labs  Lab 09/19/20 1511 09/20/20 0708  LATICACIDVEN 0.7  --   PROCALCITON  --  <0.10     ENDOCRINE CBG (last 3)  Recent Labs    09/26/20 0423 09/26/20 0752 09/26/20 1151  GLUCAP 192* 236* 274*         IMAGING x48h  - image(s) personally visualized  -   highlighted in bold CT Angio Chest Pulmonary Embolism (PE) W or WO Contrast  Result Date: 09/25/2020 CLINICAL DATA:  Cough and hypoxia.  COVID-19. EXAM: CT ANGIOGRAPHY CHEST WITH CONTRAST TECHNIQUE: Multidetector CT imaging of the chest was performed using the standard protocol during bolus administration of intravenous contrast. Multiplanar CT image reconstructions and MIPs were obtained to evaluate the vascular anatomy. CONTRAST:  35mL OMNIPAQUE IOHEXOL 350 MG/ML SOLN COMPARISON:  09/20/2020 FINDINGS: Cardiovascular: Contrast injection is sufficient to demonstrate satisfactory opacification of the pulmonary arteries to the segmental level. There is no  pulmonary embolus or evidence of right heart strain. The size of the main pulmonary artery is normal. Heart size is normal, with no pericardial effusion. The course and caliber of the aorta are normal. There is no atherosclerotic calcification. No acute aortic syndrome. Mediastinum/Nodes: Bulky mediastinal adenopathy is unchanged since the recent study of 09/20/2020. Lungs/Pleura: Tracheostomy tube tip is at the level of the clavicular heads. There is increased pleural fluid on the right. Diffuse pleural thickening is unchanged. Unchanged appearance of diffuse parenchymal interstitial abnormality. Upper Abdomen: Contrast bolus timing is not optimized for evaluation of the abdominal organs. The visualized portions of the organs of the upper abdomen are normal. Musculoskeletal: No chest wall abnormality. No bony spinal canal stenosis. Review of the MIP images confirms the above findings. IMPRESSION: 1. No pulmonary embolus or acute aortic syndrome. 2. Bulky mediastinal adenopathy and diffuse  pleural, interstitial and parenchymal pulmonary abnormalities are unchanged since 09/20/2020. Electronically Signed   By: Ulyses Jarred M.D.   On: 09/25/2020 02:54   VAS [place] LOWER EXTREMITY VENOUS (DVT)  Result Date: 09/25/2020  Lower Venous DVT Study Patient Name:  REYNARD CHRISTOFFERSEN [facility]  Date of Exam:   09/24/2020 Medical Rec #: [id number]          Accession #:    [id number] Date of Birth: 04-02-1956           Patient Gender: M Patient Age:   64Y Exam Location:  [facility] Procedure:      VAS [place] LOWER EXTREMITY VENOUS (DVT) Referring Phys: [id number] Jaquasia Doscher --------------------------------------------------------------------------------  Indications: Post-Covid severe pulmonary fibrosis, relapsed Hodkin's lymphoma, edema.  Comparison       07-09-2019 Bilateral lower extremity venous was negative for Study:           DVT. Performing Technologist: Darlin Coco RDMS,RVT  Examination Guidelines: A complete evaluation  includes B-mode imaging, spectral Doppler, color Doppler, and power Doppler as needed of all accessible portions of each vessel. Bilateral testing is considered an integral part of a complete examination. Limited examinations for reoccurring indications may be performed as noted. The reflux portion of the exam is performed with the patient in reverse Trendelenburg.  +---------+---------------+---------+-----------+----------+--------------+ RIGHT    CompressibilityPhasicitySpontaneityPropertiesThrombus Aging +---------+---------------+---------+-----------+----------+--------------+ CFV      Full           Yes      Yes                                 +---------+---------------+---------+-----------+----------+--------------+ SFJ      Full                                                        +---------+---------------+---------+-----------+----------+--------------+ FV Prox  Full                                                        +---------+---------------+---------+-----------+----------+--------------+ FV Mid   Full                                                        +---------+---------------+---------+-----------+----------+--------------+ FV DistalFull                                                        +---------+---------------+---------+-----------+----------+--------------+ PFV      Full                                                        +---------+---------------+---------+-----------+----------+--------------+ POP  Full           Yes      Yes                                 +---------+---------------+---------+-----------+----------+--------------+ PTV      Full                                                        +---------+---------------+---------+-----------+----------+--------------+ PERO     Full                                                         +---------+---------------+---------+-----------+----------+--------------+   +---------+---------------+---------+-----------+----------+--------------+ LEFT     CompressibilityPhasicitySpontaneityPropertiesThrombus Aging +---------+---------------+---------+-----------+----------+--------------+ CFV      Full           Yes      Yes                                 +---------+---------------+---------+-----------+----------+--------------+ SFJ      Full                                                        +---------+---------------+---------+-----------+----------+--------------+ FV Prox  Full                                                        +---------+---------------+---------+-----------+----------+--------------+ FV Mid   Full                                                        +---------+---------------+---------+-----------+----------+--------------+ FV DistalFull                                                        +---------+---------------+---------+-----------+----------+--------------+ PFV      Full                                                        +---------+---------------+---------+-----------+----------+--------------+ POP      Full           Yes      Yes                                 +---------+---------------+---------+-----------+----------+--------------+  PTV      Full                                                        +---------+---------------+---------+-----------+----------+--------------+ PERO     Full                                                        +---------+---------------+---------+-----------+----------+--------------+     Summary: RIGHT: - There is no evidence of deep vein thrombosis in the lower extremity.  - No cystic structure found in the popliteal fossa.  - Ultrasound characteristics of enlarged lymph nodes are noted in the groin.  LEFT: - There is no evidence of deep vein thrombosis  in the lower extremity.  - No cystic structure found in the popliteal fossa.  *See table(s) above for measurements and observations. Electronically signed by Harold Barban MD on 09/25/2020 at 10:25:35 PM.    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 - ID managing - Post admit worsening acute on chronic respriatory failure with worsening hypoxemia .  - PE/DVT ruled out. Needing vent support PSV  - likely sepsis medidated demand on compromised lungs   09/26/2020 -stuck on 40% FiO2 per support ventilation without any improvement  Plan - PSV as tolerated -> [facility] - abx as below oer ID  - cefeopime 6/15 - 6/15  - vanc 6/11 >> -Continue ongoing goals of care   Overall: Do not think he will substantially improved.      Best practice (right click and "Reselect all SmartList Selections" daily)  Per triad     ATTESTATION & SIGNATURE      Dr. Brand Males, M.D., [facility]C.P Pulmonary and Critical Care Medicine Staff Physician [facility] Pager: [phone], If no answer or between  15:00h - 7:00h: call [phone]  09/26/2020 2:12 PM

## 2020-09-27 DIAGNOSIS — E1165 Type 2 diabetes mellitus with hyperglycemia: Secondary | ICD-10-CM | POA: Diagnosis not present

## 2020-09-27 DIAGNOSIS — E873 Alkalosis: Secondary | ICD-10-CM | POA: Diagnosis not present

## 2020-09-27 DIAGNOSIS — J9611 Chronic respiratory failure with hypoxia: Secondary | ICD-10-CM | POA: Diagnosis not present

## 2020-09-27 DIAGNOSIS — D649 Anemia, unspecified: Secondary | ICD-10-CM | POA: Diagnosis not present

## 2020-09-27 DIAGNOSIS — R627 Adult failure to thrive: Secondary | ICD-10-CM | POA: Diagnosis not present

## 2020-09-27 LAB — RENAL FUNCTION PANEL
Albumin: 1.9 g/dL — ABNORMAL LOW (ref 3.5–5.0)
Anion gap: 5 (ref 5–15)
BUN: 29 mg/dL — ABNORMAL HIGH (ref 8–23)
CO2: 42 mmol/L — ABNORMAL HIGH (ref 22–32)
Calcium: 9 mg/dL (ref 8.9–10.3)
Chloride: 94 mmol/L — ABNORMAL LOW (ref 98–111)
Creatinine, Ser: 0.5 mg/dL — ABNORMAL LOW (ref 0.61–1.24)
GFR, Estimated: >60 mL/min (ref >60)
Glucose, Bld: 179 mg/dL — ABNORMAL HIGH (ref 70–99)
Phosphorus: 3.4 mg/dL (ref 2.5–4.6)
Potassium: 4.2 mmol/L (ref 3.5–5.1)
Sodium: 141 mmol/L (ref 135–145)

## 2020-09-27 LAB — CBC
HCT: 27.7 % — ABNORMAL LOW (ref 39.0–52.0)
Hemoglobin: 8.4 g/dL — ABNORMAL LOW (ref 13.0–17.0)
MCH: 28.7 pg (ref 26.0–34.0)
MCHC: 30.3 g/dL (ref 30.0–36.0)
MCV: 94.5 fL (ref 80.0–100.0)
Platelets: 211 10*3/uL (ref 150–400)
RBC: 2.93 MIL/uL — ABNORMAL LOW (ref 4.22–5.81)
RDW: 17.9 % — ABNORMAL HIGH (ref 11.5–15.5)
WBC: 17 10*3/uL — ABNORMAL HIGH (ref 4.0–10.5)
nRBC: 0 % (ref 0.0–0.2)

## 2020-09-27 LAB — GLUCOSE, CAPILLARY
Glucose-Capillary: 162 mg/dL — ABNORMAL HIGH (ref 70–99)
Glucose-Capillary: 177 mg/dL — ABNORMAL HIGH (ref 70–99)
Glucose-Capillary: 196 mg/dL — ABNORMAL HIGH (ref 70–99)
Glucose-Capillary: 211 mg/dL — ABNORMAL HIGH (ref 70–99)
Glucose-Capillary: 216 mg/dL — ABNORMAL HIGH (ref 70–99)
Glucose-Capillary: 281 mg/dL — ABNORMAL HIGH (ref 70–99)

## 2020-09-27 LAB — CULTURE, BLOOD (ROUTINE X 2)
Culture: NO GROWTH
Culture: NO GROWTH
Special Requests: ADEQUATE
Special Requests: ADEQUATE

## 2020-09-27 LAB — MAGNESIUM: Magnesium: 2 mg/dL (ref 1.7–2.4)

## 2020-09-27 LAB — LACTATE DEHYDROGENASE: LDH: 177 U/L (ref 98–192)

## 2020-09-27 NOTE — Progress Notes (Signed)
Consultation Note Date: 09/27/2020   Patient Name: Steve [name]  DOB: [birth year]  MRN: [id number]  Age / Sex: 65 y.o., male  PCP: Steve [name] Referring Physician: Mercy Riding, MD  Reason for Consultation: Establishing goals of care  HPI/Patient Profile: 65 y.o. male  with past medical history of complicated medical course following Hodgkin's lymphoma and subsequent complications following COVID in spring 2021.  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  He was admitted on 09/19/2020 with worsening cough and lethargy and found to have bacteremia with staph epidermis as well as CT revealing worsening lymphedema concerning for Hodgkin progression.  He follows with Steve [name] who recommended ice protocol but this is highly immunosuppressive and he will need to balance this in conjunction with significant infection right now.  He is requiring ventilator support.  Palliative consulted for goals of care.  Clinical Assessment and Goals of Care: I know Steve [name] from prior admission with COVID from spring 2021.  I reviewed his chart and clinical course since that time.  I met today with Steve [name] and his sister.  I introduced palliative care as specialized medical care for people living with serious illness. It focuses on providing relief from the symptoms and stress of a serious illness. The goal is to improve quality of life for both the patient and the family.  He does not remember meeting me in the past, however, he was critically ill and unable to speak at that time.  During my encounter with him today, he is awake and alert and speaks easily with me despite trach.  We discussed clinical course and I answered his sister's questions regarding multiple  problems and how bacteremia is impacting ability to proceed with treatment for Hodgkin's lymphoma.  Steve [name] has known Steve [name] for very long time and places faith in his thoughts on care plan moving forward.  He is hopeful to proceed with therapy for his Hodgkin's lymphoma whenever Steve [name] feels it is appropriate.  We discussed things most important to him, and he always comes back to being being able to spend as much time as possible with his young children.  He is very clear in desire to continue with any and all offered interventions with this goal in mind.   Questions and concerns addressed.   PMT will continue to support holistically.   SUMMARY OF RECOMMENDATIONS   -Mr. Ord is invested in plan for continuation of any and all interventions.  He has 2 young children (11 and 8) that of the joy of his life and he reports plan to continue with anything he can to allow him to spend as much time as possible with them.  He has certainly had a long road over the past year and a half, but he reports being thankful for the gains he has made and continues to be very motivated for any  offered interventions to prolong his life.  I do not believe his desire for continuation of aggressive interventions is likely to change at any point in the near future. -He knows he can request follow-up from our team if he would like to discuss anything further or we can be of further assistance with his care moving forward.  Code Status/Advance Care Planning: Full code  Prognosis:  Guarded  Discharge Planning: To Be Determined      Primary Diagnoses: Present on Admission:  Failure to thrive in adult  Chronic respiratory failure with hypoxia ([place])  Essential hypertension  Hodgkin's lymphoma ([place])  Bacteremia   I have reviewed the medical record, interviewed the patient and family, and examined the patient. The following aspects are pertinent.  Past Medical History:  Diagnosis Date   Abscess of  muscle 08/10/2011   staph infection of right hip    Acute on chronic respiratory failure with hypoxia ([name])    Acute respiratory distress syndrome (ARDS) due to COVID-19 virus ([name])    Anxiety    Diabetes mellitus without complication ([name])    GERD (gastroesophageal reflux disease)    rare reflux - no meds for reflux - NO PROBLEM IN PAST SEVERAL YRS   HCAP (healthcare-associated pneumonia)    Hip dysplasia, congenital    no surgery as a child for hip dysplasia - has had bilateral hip replacements as an adult   Hodgkin lymphoma ([place])    Hypertension    Pancreatitis    Pneumothorax, acute    Postoperative anemia due to acute blood loss 09/07/2012   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 5 times a year at most. 2 beers when he drinks.   Drug use: No   Sexual activity: Yes    Partners: Female  Other Topics Concern   Not on file  Social History Narrative   Married, 1 son and 3 sons.   Welder/fabricator   2 caffeinated beverages daily   Social Determinants of Radio broadcast assistant Strain: Not on file  Food Insecurity: Not on file  Transportation Needs: Not on file  Physical Activity: Not on file  Stress: Not on file  Social Connections: Not on file   Family History  Problem Relation Age of Onset   Melanoma Mother    Heart attack Mother    Hyperlipidemia Neg Hx    Sudden death Neg Hx    Hypertension Neg Hx     Diabetes Neg Hx    Scheduled Meds:  Chlorhexidine Gluconate Cloth  6 each Topical Q0600   cholecalciferol  10,000 Units Oral Daily   enoxaparin (LOVENOX) injection  40 mg Subcutaneous Q24H   free water  100 mL Per Tube Q6H   hydrocortisone sod succinate (SOLU-CORTEF) inj  50 mg Intravenous Q8H  insulin aspart  0-15 Units Subcutaneous Q4H   insulin aspart  2 Units Subcutaneous Q4H   insulin glargine  10 Units Subcutaneous BID   mouth rinse  15 mL Mouth Rinse 10 times per day   midodrine  5 mg Per Tube TID WC   multivitamin  15 mL Per Tube Daily   pantoprazole sodium  40 mg Per Tube Daily   sertraline  50 mg Per Tube Daily   traZODone  100 mg Per Tube QHS   Continuous Infusions:  feeding supplement (VITAL AF 1.2 CAL) 1,000 mL (09/27/20 0600)   vancomycin Stopped (09/27/20 0446)   PRN Meds:.albuterol, guaiFENesin, hyoscyamine, LORazepam Medications Prior to Admission:  Prior to Admission medications   Medication Sig Start Date End Date Taking? Authorizing Provider  albuterol (PROVENTIL) (2.5 MG/3ML) 0.083% nebulizer solution Take 3 mLs (2.5 mg total) by nebulization every 2 (two) hours as needed for wheezing. 07/28/20  Yes Saguier, Percell Miller, PA-C  Amino Acids-Protein Hydrolys (FEEDING SUPPLEMENT, PRO-STAT SUGAR FREE 64,) LIQD Place 30 mLs into feeding tube daily.   Yes [provider]  calamine lotion Apply topically as needed for itching. Patient taking differently: Apply 1 application topically daily as needed for itching. 07/18/20  Yes Pokhrel, Laxman, MD  chlorhexidine (PERIDEX) 0.12 % solution 15 mLs by Mouth Rinse route 2 (two) times daily. Patient taking differently: 15 mLs by Mouth Rinse route 2 (two) times daily as needed (mouth pain). 04/17/20  Yes Florencia Reasons, MD  Cholecalciferol (VITAMIN D3) 250 MCG (10000 UT) capsule Take 10,000 Units by mouth daily.   Yes [provider]  guaiFENesin (ROBITUSSIN) 100 MG/5ML SOLN Place 5 mLs (100 mg total) into feeding tube every 4  (four) hours as needed for cough or to loosen phlegm. 07/18/20  Yes Pokhrel, Corrie Mckusick, MD  HYOSYNE 0.125 MG/ML solution Take 0.125 mg by mouth every 4 (four) hours as needed for cramping. 07/20/20  Yes [provider]  lidocaine-prilocaine (EMLA) cream Apply to affected area once Patient taking differently: Apply 1 application topically daily as needed (access port). Apply to affected area once 07/09/20  Yes Ennever, Rudell Cobb, MD  loperamide HCl (IMODIUM) 1 MG/7.5ML suspension Place 15 mLs (2 mg total) into feeding tube as needed for diarrhea or loose stools (maximum 25m per day). Patient taking differently: Place 2 mg into feeding tube daily as needed for diarrhea or loose stools (maximum 174mper day). 07/20/20  Yes Pokhrel, Laxman, MD  LORazepam (ATIVAN) 0.5 MG tablet Take 1 tablet (0.5 mg total) by mouth every 6 (six) hours as needed (Nausea or vomiting). 07/09/20  Yes Ennever, PeRudell CobbMD  midodrine (PROAMATINE) 5 MG tablet Take 1 tablet (5 mg total) by mouth 3 (three) times daily with meals. 08/25/20  Yes GhCaren GriffinsMD  Multiple Vitamin (MULTIVITAMIN) LIQD Place 15 mLs into feeding tube daily. 08/28/19  Yes Ollis, BrCleaster CorinNP  Nutritional Supplements (FEEDING SUPPLEMENT, OSMOLITE 1.2 CAL,) LIQD Place 1,000 mLs into feeding tube continuous. 04/17/20  Yes XuFlorencia ReasonsMD  ondansetron (ZOFRAN) 4 MG tablet Take 1 tablet (4 mg total) by mouth every 6 (six) hours as needed for nausea. 07/18/20  Yes Pokhrel, Laxman, MD  pantoprazole (PROTONIX) 40 MG tablet Take 40 mg by mouth daily.   Yes [provider]  sertraline (ZOLOFT) 50 MG tablet GIVE 1 TAB PER TUBE DAILY Patient taking differently: Take 50 mg by mouth daily. 07/07/20  Yes Saguier, EdPercell MillerPA-C  traZODone (DESYREL) 100 MG tablet PLACE 1 TABLET (100  MG TOTAL) INTO FEEDING TUBE AT BEDTIME. Patient taking differently: Take 100 mg by mouth at bedtime. 08/03/20  Yes Saguier, Percell Miller, PA-C  triamcinolone (KENALOG) 0.1 % Apply 1 application  topically 2 (two) times daily as needed (itching).   Yes [provider]  Chlorhexidine Gluconate Cloth 2 % PADS Apply 6 each topically daily. Patient not taking: Reported on 09/20/2020 04/18/20   Florencia Reasons, MD  furosemide (LASIX) 20 MG tablet PLACE 1 TABLET (20 MG TOTAL) INTO FEEDING TUBE DAILY. 09/23/20   Saguier, Percell Miller, PA-C  pantoprazole sodium (PROTONIX) 40 mg/20 mL PACK Place 20 mLs (40 mg total) into feeding tube daily. Patient not taking: No sig reported 08/25/20 09/24/20  Caren Griffins, MD   No Known Allergies Review of Systems  Constitutional:  Positive for activity change and fatigue.  Respiratory:  Positive for shortness of breath and wheezing.   Neurological:  Positive for weakness.   Physical Exam General: Alert, awake, in no acute distress.   HEENT: No bruits, no goiter, no JVD, Trach Heart: Regular rate and rhythm. No murmur appreciated. Lungs: Fair air movement, no wheezing or rhonchi Abdomen: Soft, nontender, nondistended, positive bowel sounds.   Ext: No significant edema, global muscle wasting Skin: Warm and dry Neuro: Grossly intact, nonfocal.   Vital Signs: BP 106/68   Pulse 90   Temp 97.6 F (36.4 C) (Oral)   Resp 19   Ht _0  (1.651 m)   Wt 63.3 kg   SpO2 92%   BMI 23.22 kg/m  Pain Scale: 0-10   Pain Score: Asleep   SpO2: SpO2: 92 % O2 Device:SpO2: 92 % O2 Flow Rate: .O2 Flow Rate (L/min): 10 L/min  IO: Intake/output summary:  Intake/Output Summary (Last 24 hours) at 09/27/2020 1023 Last data filed at 09/27/2020 3832 Gross per 24 hour  Intake 2619.39 ml  Output 1210 ml  Net 1409.39 ml    LBM: Last BM Date: 09/25/20 Baseline Weight: Weight: 63.3 kg Most recent weight: Weight: 63.3 kg     Palliative Assessment/Data:   Flowsheet Rows    Flowsheet Row Most Recent Value  Intake Tab   Referral Department Hospitalist  Unit at Time of Referral ICU  Palliative Care Primary Diagnosis Sepsis/Infectious Disease  Date Notified 09/25/20   Palliative Care Type Return patient Palliative Care  Reason for referral Clarify Goals of Care  Date of Admission 09/19/20  Date first seen by Palliative Care 09/26/20  # of days Palliative referral response time 1 Day(s)  # of days IP prior to Palliative referral 6  Clinical Assessment   Palliative Performance Scale Score 30%  Psychosocial & Spiritual Assessment   Palliative Care Outcomes   Patient/Family meeting held? Yes  Who was at the meeting? Patient, sister       Time In: 22 Time Out: 1850 Time Total: 72 Greater than 50%  of this time was spent counseling and coordinating care related to the above assessment and plan.  Signed by: Micheline Rough, MD   Please contact Palliative Medicine Team phone at [phone] for questions and concerns.  For individual provider: See Shea Evans

## 2020-09-27 NOTE — Progress Notes (Signed)
PROGRESS NOTE  SAABIR BLYTH GXQ:[id number] DOB: 10/29/1955   PCP: [name], PA-C  Patient is from: Home.  Lives with his wife.  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   On 09/24/2020, patient developed respiratory distress with increased oxygen requirement, and transferred to ICU and started on pressure support ventilation.  Palliative medicine consulted.  Remains full code with full scope of care.   Subjective: Seen and examined earlier this morning.  No major events overnight of this morning.  No complaints either.  He denies pain, GI or UTI symptoms.  Objective: Vitals:   09/27/20 0819 09/27/20 0838 09/27/20 0900 09/27/20 1000  BP:   105/64 137/69  Pulse:   78 98  Resp:   (!) 33 (!) 27  Temp:  97.6 F (36.4 C)    TempSrc:  Oral    SpO2: 92%  95% 93%  Weight:      Height:        Intake/Output Summary (Last 24 hours) at 09/27/2020 1125 Last data filed at 09/27/2020 1000 Gross per 24 hour  Intake 2749.39 ml  Output 1210 ml  Net 1539.39 ml   Filed Weights   09/19/20 1930 09/26/20 0258  Weight: 63.3 kg 63.3 kg    Examination:  GENERAL: No apparent distress.  Nontoxic. HEENT: MMM.  Vision and hearing grossly intact.  NECK: Tracheostomy. RESP: On PS MV via trach.  No IWOB.  Fair aeration bilaterally. CVS:  RRR. Heart sounds normal.  ABD/GI/GU: BS+. Abd soft, NTND.  PEG tube in place. MSK/EXT:  Moves extremities.  Significant muscle mass and subcu fat loss. SKIN: no apparent skin lesion or wound NEURO: Awake and  alert. Oriented appropriately.  No apparent focal neuro deficit. PSYCH: Calm. Normal affect.   Procedures:  None  Microbiology summarized: [id number] and influenza PCR nonreactive. MRSA PCR screen negative. 6/11-blood culture with staph epidermis-MecA/C 6/14-blood cultures NGTD.  Assessment & Plan: Sepsis due to possible pneumonia/staph epidermis bacteremia: POA. Had Leukocytosis, tachycardia pia and tachycardia on presentation.  CXR showed possible recurrent pneumonia but difficult to interpret.  CT chest showed interval increase in bulk of mediastinal and axillary adenopathy.  Culture data as above.  Port a cath removed on 6/16. -IV cefepime 6/11-6/15 -ID recommends IV Vanco for minimum of 2 weeks from 6/11 if not able to rule out endocarditis. -ID recommended TEE to exclude endocarditis-to be coordinated with PCCM -Continue midodrine and stress dose steroid for blood pressure support   Hypotension likely arterial insufficiency from chronic steroid in the setting of sepsis. -On midodrine and stress dose steroid.   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  CXR and CT chest as above. -On PS MV since 6/16 -PCCM managing. -SLP recommended regular diet.      Relapsed Hodgkin's lymphoma: CT chest concerning for worsening LAD and progression. -Oncology following.     Uncontrolled DM-2 with hyperglycemia: Hyperglycemia likely due to steroid. Recent Labs  Lab 09/26/20 1604 09/26/20 2012 09/27/20 0020 09/27/20  0340 09/27/20 0805  GLUCAP 147* 196* 211* 162* 177*  -Continue SSI to moderate every 4 hours. -Continue NovoLog 2 units every 4 hours -Continue Lantus 10 units twice daily -Further adjustment as appropriate  Normocytic anemia/anemia of chronic disease: Anemia panel consistent with this. Recent Labs    09/08/20 1140 09/18/20 1212  09/19/20 1511 09/20/20 1015 09/22/20 0536 09/23/20 1026 09/24/20 0534 09/25/20 0258 09/26/20 0304 09/27/20 0334  HGB 11.3* 10.8* 10.2* 10.2* 9.1* 9.9* 10.0* 8.7* 8.9* 8.4*  -Monitor.    Hyponatremia: Resolved.  Metabolic alkalosis: CO2 retention?  He is on mechanical ventilation. -Continue monitoring  Leukocytosis/bandemia: Likely due to sepsis on steroid. -Monitor  Severe protein calorie malnutrition: POA. Body mass index is 23.22 kg/m. Nutrition Problem: Severe Malnutrition Etiology: chronic illness, cancer and cancer related treatments Signs/Symptoms: severe fat depletion, severe muscle depletion Interventions: Tube feeding, Prostat, MVI   enoxaparin (LOVENOX) injection 40 mg Start: 09/19/20 2200  Code Status: Full code Family Communication: Patient and/or RN. Available if any question.  Level of care: ICU Status is: Inpatient  Remains inpatient appropriate because:Hemodynamically unstable, Unsafe d/c plan, IV treatments appropriate due to intensity of illness or inability to take PO, and Inpatient level of care appropriate due to severity of illness  Dispo: The patient is from: Home              Anticipated d/c is to: Home              Patient currently is not medically stable to d/c.   Difficult to place patient No       Consultants:  Pulmonology Hematology/oncology Palliative medicine   Sch Meds:  Scheduled Meds:  Chlorhexidine Gluconate Cloth  6 each Topical Q0600   cholecalciferol  10,000 Units Oral Daily   enoxaparin (LOVENOX) injection  40 mg Subcutaneous Q24H   free water  100 mL Per Tube Q6H   hydrocortisone sod succinate (SOLU-CORTEF) inj  50 mg Intravenous Q8H   insulin aspart  0-15 Units Subcutaneous Q4H   insulin aspart  2 Units Subcutaneous Q4H   insulin glargine  10 Units Subcutaneous BID   mouth rinse  15 mL Mouth Rinse 10 times per day   midodrine  5 mg Per Tube TID WC   multivitamin  15 mL Per Tube Daily   pantoprazole sodium   40 mg Per Tube Daily   sertraline  50 mg Per Tube Daily   traZODone  100 mg Per Tube QHS   Continuous Infusions:  feeding supplement (VITAL AF 1.2 CAL) 1,000 mL (09/27/20 0600)   vancomycin Stopped (09/27/20 0446)   PRN Meds:.albuterol, guaiFENesin, hyoscyamine, LORazepam  Antimicrobials: Anti-infectives (From admission, onward)    Start     Dose/Rate Route Frequency Ordered Stop   09/23/20 0800  ceFEPIme (MAXIPIME) 2 g in sodium chloride 0.9 % 100 mL IVPB  Status:  Discontinued        2 g 200 mL/hr over 30 Minutes Intravenous Every 8 hours 09/23/20 0738 09/23/20 1758   09/21/20 0400  vancomycin (VANCOREADY) IVPB 750 mg/150 mL        750 mg 150 mL/hr over 60 Minutes Intravenous Every 12 hours 09/20/20 1715     09/20/20 1730  vancomycin (VANCOREADY) IVPB 750 mg/150 mL        750 mg 150 mL/hr over 60 Minutes Intravenous STAT 09/20/20 1714 09/20/20 1954   09/20/20 0400  vancomycin (VANCOREADY) IVPB 750 mg/150 mL  Status:  Discontinued        750  mg 150 mL/hr over 60 Minutes Intravenous Every 12 hours 09/19/20 1932 09/20/20 1155   09/20/20 0000  ceFEPIme (MAXIPIME) 2 g in sodium chloride 0.9 % 100 mL IVPB  Status:  Discontinued        2 g 200 mL/hr over 30 Minutes Intravenous Every 8 hours 09/19/20 1932 09/23/20 0721   09/19/20 1600  ceFEPIme (MAXIPIME) 2 g in sodium chloride 0.9 % 100 mL IVPB        2 g 200 mL/hr over 30 Minutes Intravenous  Once 09/19/20 1548 09/19/20 1634   09/19/20 1600  vancomycin (VANCOREADY) IVPB 1250 mg/250 mL        1,250 mg 166.7 mL/hr over 90 Minutes Intravenous STAT 09/19/20 1557 09/19/20 1822        I have personally reviewed the following labs and images: CBC: Recent Labs  Lab 09/23/20 1026 09/24/20 0534 09/25/20 0258 09/26/20 0304 09/27/20 0334  WBC 19.2* 20.4* 16.9* 17.8* 17.0*  NEUTROABS 16.0* 16.4* 13.8* 15.4*  --   HGB 9.9* 10.0* 8.7* 8.9* 8.4*  HCT 34.0* 35.3* 29.9* 29.8* 27.7*  MCV 99.7 102.0* 99.0 96.1 94.5  PLT 236 218 202 214  211   BMP &GFR Recent Labs  Lab 09/22/20 0536 09/23/20 1026 09/24/20 0534 09/25/20 0258 09/26/20 0304 09/27/20 0334  NA 138 140 141 141 143 141  K 4.1 4.7 4.7 4.3 4.2 4.2  CL 92* 94* 95* 93* 93* 94*  CO2 43* 42* 41* 41* 45* 42*  GLUCOSE 326* 317* 391* 271* 191* 179*  BUN 25* 23 24* 25* 24* 29*  CREATININE 0.51* 0.51* 0.55* 0.47* 0.49* 0.50*  CALCIUM 9.3 9.4 9.7 9.0 9.2 9.0  MG 2.1  --  2.1  --  2.1 2.0  PHOS 2.7  --  2.3*  --  2.7 3.4   Estimated Creatinine Clearance: 81.1 mL/min (A) (by C-G formula based on SCr of 0.5 mg/dL (L)). Liver & Pancreas: Recent Labs  Lab 09/23/20 1026 09/24/20 0534 09/25/20 0258 09/26/20 0304 09/27/20 0334  AST 11* 12* 13* 13*  --   ALT 19 17 18 18   --   ALKPHOS 73 73 59 61  --   BILITOT 0.3 0.2* 0.4 0.3  --   PROT 6.3* 6.4* 5.5* 5.7*  --   ALBUMIN 2.1* 2.2* 2.0* 2.0* 1.9*   No results for input(s): LIPASE, AMYLASE in the last 168 hours. No results for input(s): AMMONIA in the last 168 hours. Diabetic: No results for input(s): HGBA1C in the last 72 hours. Recent Labs  Lab 09/26/20 1604 09/26/20 2012 09/27/20 0020 09/27/20 0340 09/27/20 0805  GLUCAP 147* 196* 211* 162* 177*   Cardiac Enzymes: No results for input(s): CKTOTAL, CKMB, CKMBINDEX, TROPONINI in the last 168 hours.  No results for input(s): PROBNP in the last 8760 hours. Coagulation Profile: No results for input(s): INR, PROTIME in the last 168 hours. Thyroid Function Tests: No results for input(s): TSH, T4TOTAL, FREET4, T3FREE, THYROIDAB in the last 72 hours. Lipid Profile: No results for input(s): CHOL, HDL, LDLCALC, TRIG, CHOLHDL, LDLDIRECT in the last 72 hours. Anemia Panel: No results for input(s): VITAMINB12, FOLATE, FERRITIN, TIBC, IRON, RETICCTPCT in the last 72 hours.  Urine analysis:    Component Value Date/Time   COLORURINE YELLOW 09/19/2020 1511   APPEARANCEUR HAZY (A) 09/19/2020 1511   LABSPEC 1.012 09/19/2020 1511   PHURINE 7.0 09/19/2020 1511    GLUCOSEU NEGATIVE 09/19/2020 1511   HGBUR NEGATIVE 09/19/2020 1511   HGBUR negative 03/23/2007 0000   BILIRUBINUR  NEGATIVE 09/19/2020 1511   BILIRUBINUR neg 11/11/2016 1017   KETONESUR NEGATIVE 09/19/2020 1511   PROTEINUR NEGATIVE 09/19/2020 1511   UROBILINOGEN 0.2 11/11/2016 1017   UROBILINOGEN 0.2 11/08/2012 0915   NITRITE NEGATIVE 09/19/2020 1511   LEUKOCYTESUR NEGATIVE 09/19/2020 1511   Sepsis Labs: Invalid input(s): PROCALCITONIN, [place]  Microbiology: Recent Results (from the past 240 hour(s))  Urine culture     Status: Abnormal   Collection Time: 09/19/20  3:11 PM   Specimen: Urine, Clean Catch  Result Value Ref Range Status   Specimen Description   Final    URINE, CLEAN CATCH Performed at [facility], [place] [address], [place], [place] [address]    Special Requests   Final    NONE Performed at [facility], [place] [address] [place], [place] [address]    Culture MULTIPLE SPECIES PRESENT, SUGGEST RECOLLECTION (A)  Final   Report Status 09/21/2020 FINAL  Final  Culture, blood (routine x 2)     Status: Abnormal   Collection Time: 09/19/20  3:11 PM   Specimen: BLOOD  Result Value Ref Range Status   Specimen Description   Final    BLOOD PORTA CATH Performed at [facility] [address] [place], [place] [address]    Special Requests   Final    BOTTLES DRAWN AEROBIC AND ANAEROBIC Blood Culture results may not be optimal due to an excessive volume of blood received in culture bottles Performed at [facility] [address], [place], [place] [address]    Culture  Setup Time   Final    GRAM POSITIVE COCCI IN BOTH AEROBIC AND ANAEROBIC BOTTLES CRITICAL RESULT CALLED TO, READ BACK BY AND VERIFIED WITH: M,SWAYNE PHARMD @1552  09/20/20 EB Performed at [facility] [address] [place], [place] [address]    Culture STAPHYLOCOCCUS EPIDERMIDIS (A)  Final   Report Status 09/22/2020  FINAL  Final   Organism ID, Bacteria STAPHYLOCOCCUS EPIDERMIDIS  Final      Susceptibility   Staphylococcus epidermidis - MIC*    CIPROFLOXACIN >=8 RESISTANT Resistant     ERYTHROMYCIN >=8 RESISTANT Resistant     GENTAMICIN 8 INTERMEDIATE Intermediate     OXACILLIN >=4 RESISTANT Resistant     TETRACYCLINE >=16 RESISTANT Resistant     VANCOMYCIN 1 SENSITIVE Sensitive     TRIMETH/SULFA 80 RESISTANT Resistant     CLINDAMYCIN >=8 RESISTANT Resistant     RIFAMPIN <=0.5 SENSITIVE Sensitive     Inducible Clindamycin NEGATIVE Sensitive     * STAPHYLOCOCCUS EPIDERMIDIS  Blood Culture ID Panel (Reflexed)     Status: Abnormal   Collection Time: 09/19/20  3:11 PM  Result Value Ref Range Status   Enterococcus faecalis NOT DETECTED NOT DETECTED Final   Enterococcus Faecium NOT DETECTED NOT DETECTED Final   Listeria monocytogenes NOT DETECTED NOT DETECTED Final   Staphylococcus species DETECTED (A) NOT DETECTED Final    Comment: CRITICAL RESULT CALLED TO, READ BACK BY AND VERIFIED WITH: M,SWAYNE PHARMD @1552  09/20/20 EB    Staphylococcus aureus (BCID) NOT DETECTED NOT DETECTED Final   Staphylococcus epidermidis DETECTED (A) NOT DETECTED Final    Comment: Methicillin (oxacillin) resistant coagulase negative staphylococcus. Possible blood culture contaminant (unless isolated from more than one blood culture draw or clinical case suggests pathogenicity). No antibiotic treatment is indicated for blood  culture contaminants. CRITICAL RESULT CALLED TO, READ BACK BY AND VERIFIED WITH: M,SWAYNE PHARMD @1552  09/20/20 EB    Staphylococcus lugdunensis NOT  DETECTED NOT DETECTED Final   Streptococcus species NOT DETECTED NOT DETECTED Final   Streptococcus agalactiae NOT DETECTED NOT DETECTED Final   Streptococcus pneumoniae NOT DETECTED NOT DETECTED Final   Streptococcus pyogenes NOT DETECTED NOT DETECTED Final   A.calcoaceticus-baumannii NOT DETECTED NOT DETECTED Final   Bacteroides fragilis NOT DETECTED  NOT DETECTED Final   Enterobacterales NOT DETECTED NOT DETECTED Final   Enterobacter cloacae complex NOT DETECTED NOT DETECTED Final   Escherichia coli NOT DETECTED NOT DETECTED Final   Klebsiella aerogenes NOT DETECTED NOT DETECTED Final   Klebsiella oxytoca NOT DETECTED NOT DETECTED Final   Klebsiella pneumoniae NOT DETECTED NOT DETECTED Final   Proteus species NOT DETECTED NOT DETECTED Final   Salmonella species NOT DETECTED NOT DETECTED Final   Serratia marcescens NOT DETECTED NOT DETECTED Final   Haemophilus influenzae NOT DETECTED NOT DETECTED Final   Neisseria meningitidis NOT DETECTED NOT DETECTED Final   Pseudomonas aeruginosa NOT DETECTED NOT DETECTED Final   Stenotrophomonas maltophilia NOT DETECTED NOT DETECTED Final   Candida albicans NOT DETECTED NOT DETECTED Final   Candida auris NOT DETECTED NOT DETECTED Final   Candida glabrata NOT DETECTED NOT DETECTED Final   Candida krusei NOT DETECTED NOT DETECTED Final   Candida parapsilosis NOT DETECTED NOT DETECTED Final   Candida tropicalis NOT DETECTED NOT DETECTED Final   Cryptococcus neoformans/gattii NOT DETECTED NOT DETECTED Final   Methicillin resistance mecA/C DETECTED (A) NOT DETECTED Final    Comment: CRITICAL RESULT CALLED TO, READ BACK BY AND VERIFIED WITH: M,SWAYNE PHARMD @1552  09/20/20 EB Performed at [facility], [address] [place], [place] [address]   Culture, blood (routine x 2)     Status: Abnormal   Collection Time: 09/19/20  3:16 PM   Specimen: BLOOD  Result Value Ref Range Status   Specimen Description   Final    BLOOD SITE NOT SPECIFIED Performed at [facility] [address], [place], [place] [address]    Special Requests   Final    BOTTLES DRAWN AEROBIC AND ANAEROBIC Blood Culture adequate volume Performed at [facility] [address] [place], [place] [address]    Culture  Setup Time   Final    GRAM POSITIVE COCCI IN BOTH AEROBIC AND  ANAEROBIC BOTTLES CRITICAL VALUE NOTED.  VALUE IS CONSISTENT WITH PREVIOUSLY REPORTED AND CALLED VALUE.    Culture (A)  Final    STAPHYLOCOCCUS EPIDERMIDIS SUSCEPTIBILITIES PERFORMED ON PREVIOUS CULTURE WITHIN THE LAST 5 DAYS. Performed at [facility], [place] [address], [place], [place] [address]    Report Status 09/22/2020 FINAL  Final  Resp Panel by RT-PCR (Flu A&B, Covid) Nasopharyngeal Swab     Status: None   Collection Time: 09/19/20  3:49 PM   Specimen: Nasopharyngeal Swab; Nasopharyngeal(NP) swabs in vial transport medium  Result Value Ref Range Status   SARS Coronavirus 2 by RT PCR NEGATIVE NEGATIVE Final    Comment: (NOTE) SARS-CoV-2 target nucleic acids are NOT DETECTED.  The SARS-CoV-2 RNA is generally detectable in upper respiratory specimens during the acute phase of infection. The lowest concentration of SARS-CoV-2 viral copies this assay can detect is 138 copies/mL. A negative result does not preclude SARS-Cov-2 infection and should not be used as the sole basis for treatment or other patient management decisions. A negative result may occur with  improper specimen collection/handling, submission of specimen other than nasopharyngeal swab, presence of viral mutation(s) within the areas targeted by this assay, and inadequate  number of viral copies(<138 copies/mL). A negative result must be combined with clinical observations, patient history, and epidemiological information. The expected result is Negative.  Fact Sheet for Patients:  EntrepreneurPulse.com.au  Fact Sheet for Healthcare Providers:  IncredibleEmployment.be  This test is no t yet approved or cleared by the [place] FDA and  has been authorized for detection and/or diagnosis of SARS-CoV-2 by FDA under an Emergency Use Authorization (EUA). This EUA will remain  in effect (meaning this test can be used) for the duration of the COVID-19 declaration under  Section 564(b)(1) of the Act, 21 U.S.C.section 360bbb-3(b)(1), unless the authorization is terminated  or revoked sooner.       Influenza A by PCR NEGATIVE NEGATIVE Final   Influenza B by PCR NEGATIVE NEGATIVE Final    Comment: (NOTE) The Xpert Xpress SARS-CoV-2/FLU/RSV plus assay is intended as an aid in the diagnosis of influenza from Nasopharyngeal swab specimens and should not be used as a sole basis for treatment. Nasal washings and aspirates are unacceptable for Xpert Xpress SARS-CoV-2/FLU/RSV testing.  Fact Sheet for Patients: EntrepreneurPulse.com.au  Fact Sheet for Healthcare Providers: IncredibleEmployment.be  This test is not yet approved or cleared by the [place] FDA and has been authorized for detection and/or diagnosis of SARS-CoV-2 by FDA under an Emergency Use Authorization (EUA). This EUA will remain in effect (meaning this test can be used) for the duration of the COVID-19 declaration under Section 564(b)(1) of the Act, 21 U.S.C. section 360bbb-3(b)(1), unless the authorization is terminated or revoked.  Performed at [facility], [place] [address], [place], [place] [address]   MRSA PCR Screening     Status: None   Collection Time: 09/20/20  6:50 AM   Specimen: Nasopharyngeal  Result Value Ref Range Status   MRSA by PCR NEGATIVE NEGATIVE Final    Comment:        The GeneXpert MRSA Assay (FDA approved for NASAL specimens only), is one component of a comprehensive MRSA colonization surveillance program. It is not intended to diagnose MRSA infection nor to guide or monitor treatment for MRSA infections. Performed at [facility], [place] [address], [place], [place] [address]   Culture, blood (Routine X 2) w Reflex to ID Panel     Status: None   Collection Time: 09/22/20  7:06 PM   Specimen: BLOOD  Result Value Ref Range Status   Specimen Description   Final    BLOOD RIGHT  ANTECUBITAL Performed at [facility] [address], [place], [place] [address]    Special Requests   Final    BOTTLES DRAWN AEROBIC AND ANAEROBIC Blood Culture adequate volume Performed at [facility] [address] [place], [place] [address]    Culture   Final    NO GROWTH 5 DAYS Performed at [facility], [place] [address] [place], [place] [address]    Report Status 09/27/2020 FINAL  Final  Culture, blood (Routine X 2) w Reflex to ID Panel     Status: None   Collection Time: 09/22/20  7:06 PM   Specimen: BLOOD RIGHT HAND  Result Value Ref Range Status   Specimen Description   Final    BLOOD RIGHT HAND Performed at [facility] [address] [place], [place] [address]    Special Requests   Final    BOTTLES DRAWN AEROBIC ONLY Blood Culture adequate volume Performed at [facility] [address] [place], [place]  [address]    Culture   Final    NO GROWTH 5 DAYS Performed at [facility], [place] [address] [place], [place] [address]    Report Status 09/27/2020 FINAL  Final  MRSA PCR Screening     Status: None   Collection Time: 09/24/20 10:47 AM  Result Value Ref Range Status   MRSA by PCR NEGATIVE NEGATIVE Final    Comment:        The GeneXpert MRSA Assay (FDA approved for NASAL specimens only), is one component of a comprehensive MRSA colonization surveillance program. It is not intended to diagnose MRSA infection nor to guide or monitor treatment for MRSA infections. Performed at [facility], [place] [address] [place],  [address]     Radiology Studies: No results found.    Quanasia Defino T. [place]  If 7PM-7AM, please contact night-coverage [email or web address] 09/27/2020, 11:25 AM

## 2020-09-27 NOTE — Progress Notes (Signed)
NAME:  Steve [name], MRN:  [id number], DOB:  1955/04/19, LOS: 1 ADMISSION DATE:  09/19/2020, CONSULTATION DATE:  09/24/20 REFERRING MD:  Dr Nolberto Hanlon, CHIEF COMPLAINT:  Resp failure   BRIEF   65 year old male who is well kwn to PCCM service from [place]-summer 2021 for s/p covid, respiratoy failure, ARDS, pneumothorax, prolonged mechanical ventilation (? 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  At baseline.   Port-A-Cath placement in December 2020 and gastrostomy tube placement in May 2021.  Of note , has Hodgkins Lymphoma  with recuirrence March 2022 (Bulky intrathoracic adenopathy esp new substernal and newosseous metastatic disease, PET 06/19/2020.). As of April 2022 got 2nd dose Adcetris.  The admitted May 2022 for "pneumonia" and Acetris help (in part due to high LFT) and all Rx held.  Also as a preadmission problem: Chronic caxhexia, protein calorie malnutrition - moderate, failure thrive, DM all as his baseline prior to admission problems/ his baseline ECOG May 2022 was 2  Admitted 09/19/20 (recent baseline 8LO2 via trach). With worsening cough, lethargy.  Blood culture came back positive with Staphylococcus epidermis on 09/22/20 (all 4 of 4 cutuirs), infectious disease and s/p Port-A-Cath removal 09/22/20. As a present on admission problem O 09/22/20 - was needed 10L O2. CT scan this admit 09/20/2020 shwoed worrsening lymphadenopathy in chest concerning for Hodgkins progression. On 6/15 - Seen by Dr Marin Olp and recommended ICE protocol when better (highly immune suppressive regimen). ID had recommended 2 week Rx  . On 6/16 - reports he is more hypoxemic needeing >90% fio2 via Trach collar though no increased work of breathing. CCM consulted  Remains full code despite multiple conversations  Pertinent  Medical History  Covid admission March - May 2021   Significant Hospital  Events: Including procedures, antibiotic start and stop dates in addition to other pertinent events   6/16 - ccm consult  6/17 -CT angiogram ruled out pulmonary embolism.  He has progressive Hodgkin's disease on the CT scan of the chest.  He was unable to sustain facemask oxygen and has required being placed on pressure support ventilation through the tracheostomy.  He now has a cuffed 6 sized tracheostomy.  Currently on 50% oxygen and saturating well and more comfortable. 6/18 -stuck on pressure support ventilator at 40% FiO2.  Unable to wean off.  He is watching TV and is able to eat  Interim History / Subjective:   09/27/2020: Still on pressure support 40% FiO2.  Unable to walk wean off.  Watching golf on television.  Palliative care meeting expressed that he desires full code and full aggressive treatment.   Objective   Blood pressure 137/69, pulse 98, temperature 97.6 F (36.4 C), temperature source Oral, resp. rate (!) 27, height 5\' 5"  (1.651 m), weight 63.3 kg, SpO2 96 %.    Vent Mode: PSV FiO2 (%):  [40 %] 40 % PEEP:  [5 cmH20] 5 cmH20 Pressure Support:  [20 cmH20] 20 cmH20 Plateau Pressure:  [19 cmH20-26 cmH20] 24 cmH20   Intake/Output Summary (Last 24 hours) at 09/27/2020 1143 Last data filed at 09/27/2020 1000 Gross per 24 hour  Intake 2749.39 ml  Output 1210 ml  Net 1539.39 ml   Filed Weights   09/19/20 1930 09/26/20 0258  Weight: 63.3 kg 63.3 kg    Examination:  Frail cachectic male.  Currently resting watching TV and then sleeping off.  40% FiO2 pressure support ventilation.  Synchronous with the ventilator distant breath sounds clear.  This is clear anteriorly posterior not examined.  Normal heart sounds abdomen soft frail and cachectic.   Labs/imaging that I havepersonally reviewed  (right click and "Reselect all SmartList Selections" daily)   LABS    PULMONARY Recent Labs  Lab 09/24/20 1230  PHART 7.350  PCO2ART 84.3*  PO2ART 41.3*  HCO3 45.3*  O2SAT  75.5    CBC Recent Labs  Lab 09/25/20 0258 09/26/20 0304 09/27/20 0334  HGB 8.7* 8.9* 8.4*  HCT 29.9* 29.8* 27.7*  WBC 16.9* 17.8* 17.0*  PLT 202 214 211    COAGULATION No results for input(s): INR in the last 168 hours.  CARDIAC  No results for input(s): TROPONINI in the last 168 hours. No results for input(s): PROBNP in the last 168 hours.   CHEMISTRY Recent Labs  Lab 09/22/20 0536 09/23/20 1026 09/24/20 0534 09/25/20 0258 09/26/20 0304 09/27/20 0334  NA 138 140 141 141 143 141  K 4.1 4.7 4.7 4.3 4.2 4.2  CL 92* 94* 95* 93* 93* 94*  CO2 43* 42* 41* 41* 45* 42*  GLUCOSE 326* 317* 391* 271* 191* 179*  BUN 25* 23 24* 25* 24* 29*  CREATININE 0.51* 0.51* 0.55* 0.47* 0.49* 0.50*  CALCIUM 9.3 9.4 9.7 9.0 9.2 9.0  MG 2.1  --  2.1  --  2.1 2.0  PHOS 2.7  --  2.3*  --  2.7 3.4   Estimated Creatinine Clearance: 81.1 mL/min (A) (by C-G formula based on SCr of 0.5 mg/dL (L)).   LIVER Recent Labs  Lab 09/23/20 1026 09/24/20 0534 09/25/20 0258 09/26/20 0304 09/27/20 0334  AST 11* 12* 13* 13*  --   ALT 19 17 18 18   --   ALKPHOS 73 73 59 61  --   BILITOT 0.3 0.2* 0.4 0.3  --   PROT 6.3* 6.4* 5.5* 5.7*  --   ALBUMIN 2.1* 2.2* 2.0* 2.0* 1.9*     INFECTIOUS No results for input(s): LATICACIDVEN, PROCALCITON in the last 168 hours.    ENDOCRINE CBG (last 3)  Recent Labs    09/27/20 0020 09/27/20 0340 09/27/20 0805  GLUCAP 211* 162* 177*         IMAGING x48h  - image(s) personally visualized  -   highlighted in bold No results found.   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 - ID managing - Post admit worsening acute on chronic respriatory failure with worsening hypoxemia .  - PE/DVT ruled out. Needing vent support PSV  - likely sepsis medidated demand on compromised lungs   09/27/2020 -stuck on pressure support ventilation with 36 tracheostomy.  No real change.  Plan - PSV as tolerated and currently on it for 72 hours-> if needed escalate to [facility] - abx as below oer ID  - cefeopime 6/15 - 6/15  - vanc 6/11 >> -Continue ongoing goals of care   Overall: Do not think he will substantially improved.   Critical care medicine will round 2 times a week   Best practice (right click and "Reselect all SmartList Selections"  daily)  Per triad     ATTESTATION & SIGNATURE      Dr. Brand Males, M.D., [facility].C.P Pulmonary and Critical Care Medicine Staff Physician [facility] Pager: [phone], If no answer or between  15:00h - 7:00h: call [phone]  09/27/2020 11:43 AM

## 2020-09-28 DIAGNOSIS — J9611 Chronic respiratory failure with hypoxia: Secondary | ICD-10-CM | POA: Diagnosis not present

## 2020-09-28 DIAGNOSIS — E1165 Type 2 diabetes mellitus with hyperglycemia: Secondary | ICD-10-CM | POA: Diagnosis not present

## 2020-09-28 DIAGNOSIS — R627 Adult failure to thrive: Secondary | ICD-10-CM | POA: Diagnosis not present

## 2020-09-28 DIAGNOSIS — R7881 Bacteremia: Secondary | ICD-10-CM | POA: Diagnosis not present

## 2020-09-28 DIAGNOSIS — D649 Anemia, unspecified: Secondary | ICD-10-CM | POA: Diagnosis not present

## 2020-09-28 DIAGNOSIS — E873 Alkalosis: Secondary | ICD-10-CM | POA: Diagnosis not present

## 2020-09-28 LAB — GLUCOSE, CAPILLARY
Glucose-Capillary: 135 mg/dL — ABNORMAL HIGH (ref 70–99)
Glucose-Capillary: 155 mg/dL — ABNORMAL HIGH (ref 70–99)
Glucose-Capillary: 187 mg/dL — ABNORMAL HIGH (ref 70–99)
Glucose-Capillary: 197 mg/dL — ABNORMAL HIGH (ref 70–99)
Glucose-Capillary: 200 mg/dL — ABNORMAL HIGH (ref 70–99)
Glucose-Capillary: 217 mg/dL — ABNORMAL HIGH (ref 70–99)
Glucose-Capillary: 225 mg/dL — ABNORMAL HIGH (ref 70–99)

## 2020-09-28 LAB — BASIC METABOLIC PANEL
Anion gap: 5 (ref 5–15)
BUN: 29 mg/dL — ABNORMAL HIGH (ref 8–23)
CO2: 40 mmol/L — ABNORMAL HIGH (ref 22–32)
Calcium: 8.9 mg/dL (ref 8.9–10.3)
Chloride: 95 mmol/L — ABNORMAL LOW (ref 98–111)
Creatinine, Ser: 0.59 mg/dL — ABNORMAL LOW (ref 0.61–1.24)
GFR, Estimated: >60 mL/min (ref >60)
Glucose, Bld: 208 mg/dL — ABNORMAL HIGH (ref 70–99)
Potassium: 4.2 mmol/L (ref 3.5–5.1)
Sodium: 140 mmol/L (ref 135–145)

## 2020-09-28 LAB — CBC
HCT: 25.7 % — ABNORMAL LOW (ref 39.0–52.0)
Hemoglobin: 8 g/dL — ABNORMAL LOW (ref 13.0–17.0)
MCH: 29.2 pg (ref 26.0–34.0)
MCHC: 31.1 g/dL (ref 30.0–36.0)
MCV: 93.8 fL (ref 80.0–100.0)
Platelets: 196 10*3/uL (ref 150–400)
RBC: 2.74 MIL/uL — ABNORMAL LOW (ref 4.22–5.81)
RDW: 18.1 % — ABNORMAL HIGH (ref 11.5–15.5)
WBC: 17.8 10*3/uL — ABNORMAL HIGH (ref 4.0–10.5)
nRBC: 0 % (ref 0.0–0.2)

## 2020-09-28 LAB — LACTIC ACID, PLASMA: Lactic Acid, Venous: 0.9 mmol/L (ref 0.5–1.9)

## 2020-09-28 LAB — VANCOMYCIN, PEAK: Vancomycin Pk: 24 ug/mL — ABNORMAL LOW (ref 30–40)

## 2020-09-28 LAB — PREPARE RBC (CROSSMATCH)

## 2020-09-28 MED ORDER — FUROSEMIDE 10 MG/ML IJ SOLN
20.0000 mg | Freq: Once | INTRAMUSCULAR | Status: AC
  Administered 2020-09-28: 20 mg via INTRAVENOUS
  Filled 2020-09-28: qty 2

## 2020-09-28 MED ORDER — SODIUM CHLORIDE 0.9% IV SOLUTION
Freq: Once | INTRAVENOUS | Status: AC
Start: 2020-09-28 — End: 2020-09-28

## 2020-09-28 MED ORDER — INSULIN ASPART 100 UNIT/ML IJ SOLN
4.0000 [IU] | INTRAMUSCULAR | Status: DC
  Administered 2020-09-28 – 2020-10-08 (×52): 4 [IU] via SUBCUTANEOUS

## 2020-09-28 MED ORDER — FUROSEMIDE 10 MG/ML IJ SOLN
20.0000 mg | Freq: Once | INTRAMUSCULAR | Status: AC
Start: 2020-09-28 — End: 2020-09-28
  Administered 2020-09-28: 20 mg via INTRAVENOUS
  Filled 2020-09-28: qty 2

## 2020-09-28 NOTE — Progress Notes (Signed)
PROGRESS NOTE  Steve [name] BHA:[id number] DOB: 1955-05-31   PCP: [name], PA-C  Patient is from: Home.  Lives with his wife.  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   On 09/24/2020, patient developed respiratory distress with increased oxygen requirement, and transferred to ICU and started on pressure support ventilation.  Palliative medicine and infectious disease following.  Remains full code with full scope of care.   Subjective: Seen and examined earlier this morning.  Hypotensive to 79/51 overnight but not symptomatic.  Lactic acid is within normal.  No complaints this morning.  He denies pain, GI or UTI symptoms.  Objective: Vitals:   09/28/20 0600 09/28/20 0615 09/28/20 0630 09/28/20 0645  BP: (!) 105/59 103/61 111/66 (!) 107/59  Pulse: 73 85 94 81  Resp: (!) 31 (!) 25 19 (!) 25  Temp:      TempSrc:      SpO2: 96% 94% 100% 96%  Weight:      Height:        Intake/Output Summary (Last 24 hours) at 09/28/2020 1050 Last data filed at 09/28/2020 0625 Gross per 24 hour  Intake 1728.53 ml  Output 1900 ml  Net -171.47 ml   Filed Weights   09/19/20 1930 09/26/20 0258  Weight: 63.3 kg 63.3 kg    Examination:  GENERAL: No apparent distress.  Nontoxic. HEENT: MMM.  Vision and hearing grossly intact.  NECK: Tracheostomy in place. RESP: On PS MV via trach. Fair aeration bilaterally. CVS:  RRR. Heart sounds normal.  ABD/GI/GU: BS+. Abd soft, NTND.  MSK/EXT:  Moves extremities.  Some muscle mass and subcu fat  loss. SKIN: no apparent skin lesion or wound NEURO: Awake and alert. Oriented appropriately.  No apparent focal neuro deficit. PSYCH: Calm. Normal affect.   Procedures:  None  Microbiology summarized: [id number] and influenza PCR nonreactive. MRSA PCR screen negative. 6/11-blood culture with staph epidermis-MecA/C 6/14-blood cultures NGTD.  Assessment & Plan: Sepsis due to possible pneumonia/staph epidermis bacteremia: POA. Had Leukocytosis, tachycardia pia and tachycardia on presentation.  CXR showed possible recurrent pneumonia but difficult to interpret.  CT chest showed interval increase in bulk of mediastinal and axillary adenopathy.  Culture data as above.  Port a cath removed on 6/16. -IV cefepime 6/11-6/15 -ID recommends IV Vanco for minimum of 2 weeks from 6/11 if not able to rule out endocarditis. -ID recommended TEE to exclude endocarditis-to be coordinated with PCCM -Continue midodrine and stress dose steroid for blood pressure support   Hypotension likely arterial insufficiency from chronic steroid in the setting of sepsis. -On midodrine and stress dose steroid.  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  CXR and CT chest as above. -On PS MV since 6/16.  PCCM managing. -SLP recommended regular diet.      Relapsed Hodgkin's lymphoma: CT chest concerning for worsening LAD and progression. -Oncology following.   Uncontrolled DM-2 with hyperglycemia: Hyperglycemia likely due to steroid. Recent Labs  Lab 09/27/20 1623 09/27/20 2004 09/28/20  0010 09/28/20 0415 09/28/20 0750  GLUCAP 281* 216* 155* 225* 197*  -Continue SSI to moderate every 4 hours. -Increase NovoLog from 2 to 4 units every 4 hours -Continue Lantus 10 units twice daily -Further adjustment as appropriate  Normocytic anemia/anemia of chronic disease: Anemia panel consistent  with this. Recent Labs    09/18/20 1212 09/19/20 1511 09/20/20 1015 09/22/20 0536 09/23/20 1026 09/24/20 0534 09/25/20 0258 09/26/20 0304 09/27/20 0334 09/28/20 0330  HGB 10.8* 10.2* 10.2* 9.1* 9.9* 10.0* 8.7* 8.9* 8.4* 8.0*  -Heme-onc ordered 1 unit -Continue monitoring.  Hyponatremia: Resolved.  Metabolic alkalosis: CO2 retention?  He is on mechanical ventilation. -Continue monitoring  Leukocytosis/bandemia: Likely due to steroid versus sepsis. -Monitor  Severe protein calorie malnutrition: POA. Body mass index is 23.22 kg/m. Nutrition Problem: Severe Malnutrition Etiology: chronic illness, cancer and cancer related treatments Signs/Symptoms: severe fat depletion, severe muscle depletion Interventions: Tube feeding, Prostat, MVI   enoxaparin (LOVENOX) injection 40 mg Start: 09/19/20 2200  Code Status: Full code Family Communication: Patient and/or RN. Available if any question.  Level of care: ICU Status is: Inpatient  Remains inpatient appropriate because:Hemodynamically unstable, Unsafe d/c plan, IV treatments appropriate due to intensity of illness or inability to take PO, and Inpatient level of care appropriate due to severity of illness  Dispo: The patient is from: Home              Anticipated d/c is to: Home              Patient currently is not medically stable to d/c.   Difficult to place patient No       Consultants:  Pulmonology Hematology/oncology Palliative medicine Infectious disease   Sch Meds:  Scheduled Meds:  sodium chloride   Intravenous Once   Chlorhexidine Gluconate Cloth  6 each Topical Q0600   cholecalciferol  10,000 Units Oral Daily   enoxaparin (LOVENOX) injection  40 mg Subcutaneous Q24H   free water  100 mL Per Tube Q6H   furosemide  20 mg Intravenous Once   hydrocortisone sod succinate (SOLU-CORTEF) inj  50 mg Intravenous Q8H   insulin aspart  0-15 Units Subcutaneous Q4H   insulin aspart  2 Units Subcutaneous Q4H    insulin glargine  10 Units Subcutaneous BID   mouth rinse  15 mL Mouth Rinse 10 times per day   midodrine  5 mg Per Tube TID WC   multivitamin  15 mL Per Tube Daily   pantoprazole sodium  40 mg Per Tube Daily   sertraline  50 mg Per Tube Daily   traZODone  100 mg Per Tube QHS   Continuous Infusions:  feeding supplement (VITAL AF 1.2 CAL) 1,000 mL (09/27/20 2325)   vancomycin 750 mg (09/28/20 0445)   PRN Meds:.albuterol, guaiFENesin, hyoscyamine, LORazepam  Antimicrobials: Anti-infectives (From admission, onward)    Start     Dose/Rate Route Frequency Ordered Stop   09/23/20 0800  ceFEPIme (MAXIPIME) 2 g in sodium chloride 0.9 % 100 mL IVPB  Status:  Discontinued        2 g 200 mL/hr over 30 Minutes Intravenous Every 8 hours 09/23/20 0738 09/23/20 1758   09/21/20 0400  vancomycin (VANCOREADY) IVPB 750 mg/150 mL        750 mg 150 mL/hr over 60 Minutes Intravenous Every 12 hours 09/20/20 1715     09/20/20 1730  vancomycin (VANCOREADY) IVPB 750 mg/150 mL        750 mg 150 mL/hr over 60 Minutes Intravenous STAT  09/20/20 1714 09/20/20 1954   09/20/20 0400  vancomycin (VANCOREADY) IVPB 750 mg/150 mL  Status:  Discontinued        750 mg 150 mL/hr over 60 Minutes Intravenous Every 12 hours 09/19/20 1932 09/20/20 1155   09/20/20 0000  ceFEPIme (MAXIPIME) 2 g in sodium chloride 0.9 % 100 mL IVPB  Status:  Discontinued        2 g 200 mL/hr over 30 Minutes Intravenous Every 8 hours 09/19/20 1932 09/23/20 0721   09/19/20 1600  ceFEPIme (MAXIPIME) 2 g in sodium chloride 0.9 % 100 mL IVPB        2 g 200 mL/hr over 30 Minutes Intravenous  Once 09/19/20 1548 09/19/20 1634   09/19/20 1600  vancomycin (VANCOREADY) IVPB 1250 mg/250 mL        1,250 mg 166.7 mL/hr over 90 Minutes Intravenous STAT 09/19/20 1557 09/19/20 1822        I have personally reviewed the following labs and images: CBC: Recent Labs  Lab 09/23/20 1026 09/24/20 0534 09/25/20 0258 09/26/20 0304 09/27/20 0334  09/28/20 0330  WBC 19.2* 20.4* 16.9* 17.8* 17.0* 17.8*  NEUTROABS 16.0* 16.4* 13.8* 15.4*  --   --   HGB 9.9* 10.0* 8.7* 8.9* 8.4* 8.0*  HCT 34.0* 35.3* 29.9* 29.8* 27.7* 25.7*  MCV 99.7 102.0* 99.0 96.1 94.5 93.8  PLT 236 218 202 214 211 196   BMP &GFR Recent Labs  Lab 09/22/20 0536 09/23/20 1026 09/24/20 0534 09/25/20 0258 09/26/20 0304 09/27/20 0334 09/28/20 0330  NA 138   < > 141 141 143 141 140  K 4.1   < > 4.7 4.3 4.2 4.2 4.2  CL 92*   < > 95* 93* 93* 94* 95*  CO2 43*   < > 41* 41* 45* 42* 40*  GLUCOSE 326*   < > 391* 271* 191* 179* 208*  BUN 25*   < > 24* 25* 24* 29* 29*  CREATININE 0.51*   < > 0.55* 0.47* 0.49* 0.50* 0.59*  CALCIUM 9.3   < > 9.7 9.0 9.2 9.0 8.9  MG 2.1  --  2.1  --  2.1 2.0  --   PHOS 2.7  --  2.3*  --  2.7 3.4  --    < > = values in this interval not displayed.   Estimated Creatinine Clearance: 81.1 mL/min (A) (by C-G formula based on SCr of 0.59 mg/dL (L)). Liver & Pancreas: Recent Labs  Lab 09/23/20 1026 09/24/20 0534 09/25/20 0258 09/26/20 0304 09/27/20 0334  AST 11* 12* 13* 13*  --   ALT 19 17 18 18   --   ALKPHOS 73 73 59 61  --   BILITOT 0.3 0.2* 0.4 0.3  --   PROT 6.3* 6.4* 5.5* 5.7*  --   ALBUMIN 2.1* 2.2* 2.0* 2.0* 1.9*   No results for input(s): LIPASE, AMYLASE in the last 168 hours. No results for input(s): AMMONIA in the last 168 hours. Diabetic: No results for input(s): HGBA1C in the last 72 hours. Recent Labs  Lab 09/27/20 1623 09/27/20 2004 09/28/20 0010 09/28/20 0415 09/28/20 0750  GLUCAP 281* 216* 155* 225* 197*   Cardiac Enzymes: No results for input(s): CKTOTAL, CKMB, CKMBINDEX, TROPONINI in the last 168 hours.  No results for input(s): PROBNP in the last 8760 hours. Coagulation Profile: No results for input(s): INR, PROTIME in the last 168 hours. Thyroid Function Tests: No results for input(s): TSH, T4TOTAL, FREET4, T3FREE, THYROIDAB in the last 72 hours. Lipid Profile:  No results for input(s): CHOL,  HDL, LDLCALC, TRIG, CHOLHDL, LDLDIRECT in the last 72 hours. Anemia Panel: No results for input(s): VITAMINB12, FOLATE, FERRITIN, TIBC, IRON, RETICCTPCT in the last 72 hours.  Urine analysis:    Component Value Date/Time   COLORURINE YELLOW 09/19/2020 1511   APPEARANCEUR HAZY (A) 09/19/2020 1511   LABSPEC 1.012 09/19/2020 1511   PHURINE 7.0 09/19/2020 1511   GLUCOSEU NEGATIVE 09/19/2020 1511   HGBUR NEGATIVE 09/19/2020 1511   HGBUR negative 03/23/2007 0000   BILIRUBINUR NEGATIVE 09/19/2020 1511   BILIRUBINUR neg 11/11/2016 1017   KETONESUR NEGATIVE 09/19/2020 1511   PROTEINUR NEGATIVE 09/19/2020 1511   UROBILINOGEN 0.2 11/11/2016 1017   UROBILINOGEN 0.2 11/08/2012 0915   NITRITE NEGATIVE 09/19/2020 1511   LEUKOCYTESUR NEGATIVE 09/19/2020 1511   Sepsis Labs: Invalid input(s): PROCALCITONIN, [place]  Microbiology: Recent Results (from the past 240 hour(s))  Urine culture     Status: Abnormal   Collection Time: 09/19/20  3:11 PM   Specimen: Urine, Clean Catch  Result Value Ref Range Status   Specimen Description   Final    URINE, CLEAN CATCH Performed at [facility], [place] [address] [place], [place] [address]    Special Requests   Final    NONE Performed at [facility], [place] [address] [place], [place] [address]    Culture MULTIPLE SPECIES PRESENT, SUGGEST RECOLLECTION (A)  Final   Report Status 09/21/2020 FINAL  Final  Culture, blood (routine x 2)     Status: Abnormal   Collection Time: 09/19/20  3:11 PM   Specimen: BLOOD  Result Value Ref Range Status   Specimen Description   Final    BLOOD PORTA CATH Performed at [place] [address] [place], [place] [address]    Special Requests   Final    BOTTLES DRAWN AEROBIC AND ANAEROBIC Blood Culture results may not be optimal due to an excessive volume of blood received in culture bottles Performed at [place] [address] [place], [place] [address]    Culture  Setup Time   Final    GRAM POSITIVE COCCI IN BOTH AEROBIC AND ANAEROBIC BOTTLES CRITICAL RESULT CALLED TO, READ BACK BY AND VERIFIED WITH: M,SWAYNE PHARMD @1552  09/20/20 EB Performed at [place] [address] [place], [place] [address]    Culture STAPHYLOCOCCUS EPIDERMIDIS (A)  Final   Report Status 09/22/2020 FINAL  Final   Organism ID, Bacteria STAPHYLOCOCCUS EPIDERMIDIS  Final      Susceptibility   Staphylococcus epidermidis - MIC*    CIPROFLOXACIN >=8 RESISTANT Resistant     ERYTHROMYCIN >=8 RESISTANT Resistant     GENTAMICIN 8 INTERMEDIATE Intermediate     OXACILLIN >=4 RESISTANT Resistant     TETRACYCLINE >=16 RESISTANT Resistant     VANCOMYCIN 1 SENSITIVE Sensitive     TRIMETH/SULFA 80 RESISTANT Resistant     CLINDAMYCIN >=8 RESISTANT Resistant     RIFAMPIN <=0.5 SENSITIVE Sensitive     Inducible Clindamycin NEGATIVE Sensitive     * STAPHYLOCOCCUS EPIDERMIDIS  Blood Culture ID Panel (Reflexed)     Status: Abnormal   Collection Time: 09/19/20  3:11 PM  Result Value Ref Range Status   Enterococcus faecalis NOT DETECTED NOT DETECTED Final   Enterococcus Faecium NOT DETECTED NOT DETECTED Final   Listeria monocytogenes NOT DETECTED NOT DETECTED Final   Staphylococcus species DETECTED (A) NOT DETECTED Final    Comment: CRITICAL RESULT CALLED TO, READ BACK  BY AND VERIFIED WITH: M,SWAYNE PHARMD @1552  09/20/20 EB    Staphylococcus aureus (BCID) NOT DETECTED NOT DETECTED Final   Staphylococcus epidermidis DETECTED (A) NOT DETECTED Final    Comment: Methicillin (oxacillin) resistant coagulase negative staphylococcus. Possible blood culture contaminant (unless isolated from more than one blood culture draw or clinical case suggests pathogenicity). No antibiotic treatment is indicated for blood  culture contaminants. CRITICAL RESULT CALLED TO, READ BACK BY AND VERIFIED WITH: M,SWAYNE PHARMD @1552  09/20/20 EB    Staphylococcus  lugdunensis NOT DETECTED NOT DETECTED Final   Streptococcus species NOT DETECTED NOT DETECTED Final   Streptococcus agalactiae NOT DETECTED NOT DETECTED Final   Streptococcus pneumoniae NOT DETECTED NOT DETECTED Final   Streptococcus pyogenes NOT DETECTED NOT DETECTED Final   A.calcoaceticus-baumannii NOT DETECTED NOT DETECTED Final   Bacteroides fragilis NOT DETECTED NOT DETECTED Final   Enterobacterales NOT DETECTED NOT DETECTED Final   Enterobacter cloacae complex NOT DETECTED NOT DETECTED Final   Escherichia coli NOT DETECTED NOT DETECTED Final   Klebsiella aerogenes NOT DETECTED NOT DETECTED Final   Klebsiella oxytoca NOT DETECTED NOT DETECTED Final   Klebsiella pneumoniae NOT DETECTED NOT DETECTED Final   Proteus species NOT DETECTED NOT DETECTED Final   Salmonella species NOT DETECTED NOT DETECTED Final   Serratia marcescens NOT DETECTED NOT DETECTED Final   Haemophilus influenzae NOT DETECTED NOT DETECTED Final   Neisseria meningitidis NOT DETECTED NOT DETECTED Final   Pseudomonas aeruginosa NOT DETECTED NOT DETECTED Final   Stenotrophomonas maltophilia NOT DETECTED NOT DETECTED Final   Candida albicans NOT DETECTED NOT DETECTED Final   Candida auris NOT DETECTED NOT DETECTED Final   Candida glabrata NOT DETECTED NOT DETECTED Final   Candida krusei NOT DETECTED NOT DETECTED Final   Candida parapsilosis NOT DETECTED NOT DETECTED Final   Candida tropicalis NOT DETECTED NOT DETECTED Final   Cryptococcus neoformans/gattii NOT DETECTED NOT DETECTED Final   Methicillin resistance mecA/C DETECTED (A) NOT DETECTED Final    Comment: CRITICAL RESULT CALLED TO, READ BACK BY AND VERIFIED WITH: M,SWAYNE PHARMD @1552  09/20/20 EB Performed at [facility], [address] [place], [place] [address]   Culture, blood (routine x 2)     Status: Abnormal   Collection Time: 09/19/20  3:16 PM   Specimen: BLOOD  Result Value Ref Range Status   Specimen Description   Final    BLOOD SITE NOT  SPECIFIED Performed at [place] [address], [place], [place] [address]    Special Requests   Final    BOTTLES DRAWN AEROBIC AND ANAEROBIC Blood Culture adequate volume Performed at [place] [address] [place], [place] [address]    Culture  Setup Time   Final    GRAM POSITIVE COCCI IN BOTH AEROBIC AND ANAEROBIC BOTTLES CRITICAL VALUE NOTED.  VALUE IS CONSISTENT WITH PREVIOUSLY REPORTED AND CALLED VALUE.    Culture (A)  Final    STAPHYLOCOCCUS EPIDERMIDIS SUSCEPTIBILITIES PERFORMED ON PREVIOUS CULTURE WITHIN THE LAST 5 DAYS. Performed at [facility], [place] [address] [place], [place] [address]    Report Status 09/22/2020 FINAL  Final  Resp Panel by RT-PCR (Flu A&B, Covid) Nasopharyngeal Swab     Status: None   Collection Time: 09/19/20  3:49 PM   Specimen: Nasopharyngeal Swab; Nasopharyngeal(NP) swabs in vial transport medium  Result Value Ref Range Status   SARS Coronavirus 2 by RT PCR NEGATIVE NEGATIVE Final    Comment: (NOTE) SARS-CoV-2 target nucleic acids  are NOT DETECTED.  The SARS-CoV-2 RNA is generally detectable in upper respiratory specimens during the acute phase of infection. The lowest concentration of SARS-CoV-2 viral copies this assay can detect is 138 copies/mL. A negative result does not preclude SARS-Cov-2 infection and should not be used as the sole basis for treatment or other patient management decisions. A negative result may occur with  improper specimen collection/handling, submission of specimen other than nasopharyngeal swab, presence of viral mutation(s) within the areas targeted by this assay, and inadequate number of viral copies(<138 copies/mL). A negative result must be combined with clinical observations, patient history, and epidemiological information. The expected result is Negative.  Fact Sheet for Patients:  EntrepreneurPulse.com.au  Fact Sheet for Healthcare  Providers:  IncredibleEmployment.be  This test is no t yet approved or cleared by the [place] FDA and  has been authorized for detection and/or diagnosis of SARS-CoV-2 by FDA under an Emergency Use Authorization (EUA). This EUA will remain  in effect (meaning this test can be used) for the duration of the COVID-19 declaration under Section 564(b)(1) of the Act, 21 U.S.C.section 360bbb-3(b)(1), unless the authorization is terminated  or revoked sooner.       Influenza A by PCR NEGATIVE NEGATIVE Final   Influenza B by PCR NEGATIVE NEGATIVE Final    Comment: (NOTE) The Xpert Xpress SARS-CoV-2/FLU/RSV plus assay is intended as an aid in the diagnosis of influenza from Nasopharyngeal swab specimens and should not be used as a sole basis for treatment. Nasal washings and aspirates are unacceptable for Xpert Xpress SARS-CoV-2/FLU/RSV testing.  Fact Sheet for Patients: EntrepreneurPulse.com.au  Fact Sheet for Healthcare Providers: IncredibleEmployment.be  This test is not yet approved or cleared by the [place] FDA and has been authorized for detection and/or diagnosis of SARS-CoV-2 by FDA under an Emergency Use Authorization (EUA). This EUA will remain in effect (meaning this test can be used) for the duration of the COVID-19 declaration under Section 564(b)(1) of the Act, 21 U.S.C. section 360bbb-3(b)(1), unless the authorization is terminated or revoked.  Performed at [facility], [place] [address], [place], [place] [address]   MRSA PCR Screening     Status: None   Collection Time: 09/20/20  6:50 AM   Specimen: Nasopharyngeal  Result Value Ref Range Status   MRSA by PCR NEGATIVE NEGATIVE Final    Comment:        The GeneXpert MRSA Assay (FDA approved for NASAL specimens only), is one component of a comprehensive MRSA colonization surveillance program. It is not intended to diagnose  MRSA infection nor to guide or monitor treatment for MRSA infections. Performed at [facility], [place] [address], [place], [place] [address]   Culture, blood (Routine X 2) w Reflex to ID Panel     Status: None   Collection Time: 09/22/20  7:06 PM   Specimen: BLOOD  Result Value Ref Range Status   Specimen Description   Final    BLOOD RIGHT ANTECUBITAL Performed at [place] [address], [place], [place] [address]    Special Requests   Final    BOTTLES DRAWN AEROBIC AND ANAEROBIC Blood Culture adequate volume Performed at [place] [address] [place], [place] [address]    Culture   Final    NO GROWTH 5 DAYS Performed at [facility], [place] [address] [place],  [address]    Report Status 09/27/2020 FINAL  Final  Culture, blood (Routine X 2)  w Reflex to ID Panel     Status: None   Collection Time: 09/22/20  7:06 PM   Specimen: BLOOD RIGHT HAND  Result Value Ref Range Status   Specimen Description   Final    BLOOD RIGHT HAND Performed at [place] [address], [place], [place] [address]    Special Requests   Final    BOTTLES DRAWN AEROBIC ONLY Blood Culture adequate volume Performed at [place] [address] [place], [place] [address]    Culture   Final    NO GROWTH 5 DAYS Performed at [facility], [place] [address], [place], [place] [address]    Report Status 09/27/2020 FINAL  Final  MRSA PCR Screening     Status: None   Collection Time: 09/24/20 10:47 AM  Result Value Ref Range Status   MRSA by PCR NEGATIVE NEGATIVE Final    Comment:        The GeneXpert MRSA Assay (FDA approved for NASAL specimens only), is one component of a comprehensive MRSA colonization surveillance program. It is not intended to diagnose MRSA infection nor to guide or monitor treatment for MRSA infections. Performed at [facility], [place] [address], [place], [place] [address]     Radiology Studies: No results found.    Stoy Fenn T. Radcliff  If 7PM-7AM, please contact night-coverage [email or web address] 09/28/2020, 10:50 AM

## 2020-09-28 NOTE — TOC Progression Note (Signed)
Transition of Care [facility]) - Progression Note    Patient Details  Name: Steve [name] MRN: [id number] Date of Birth: Oct 13, 1955  [facility] CM/SW Contact  Leeroy Cha, RN Phone Number: 09/28/2020, 9:18 AM  Clinical Narrative:    [facility] Events: Including procedures, antibiotic start and stop dates in addition to other pertinent events  6/16 - ccm consult   6/17 -CT angiogram ruled out pulmonary embolism.  He has progressive Hodgkin's disease on the CT scan of the chest.  He was unable to sustain facemask oxygen and has required being placed on pressure support ventilation through the tracheostomy.  He now has a cuffed 6 sized tracheostomy.  Currently on 50% oxygen and saturating well and more comfortable. 6/18 -stuck on pressure support ventilator at 40% FiO2.  Unable to wean off.  He is watching TV and is able to eat   Interim History / Subjective:    09/27/2020: Still on pressure support 40% FiO2.  Unable to walk wean off.  Watching golf on television.  Palliative care meeting expressed that he desires full code and full aggressive treatment. TOC PLAN:  from home with hhc, remains on the vent, wbc=17.8, hgb 8.0 has been t&s for possible bld.iv abx.  Expected Discharge Plan: [place] Barriers to Discharge: Continued Medical Work up  Expected Discharge Plan and Services Expected Discharge Plan: [place] In-house Referral: Clinical Social Work Discharge Planning Services: CM Consult Post Acute Care Choice: [place] arrangements for the past 2 months: Mobile Home                 DME Arranged: N/A DME Agency: NA       HH Arranged: PT [place]: [place] ([place]) Date [place]: 09/22/20   Representative spoke with at [place]: [place] (SDOH) Interventions    Readmission Risk Interventions Readmission Risk Prevention Plan 09/22/2020  08/24/2020 07/22/2019  Transportation Screening Complete Complete Complete  Medication Review Press photographer) Complete Complete Complete  HRI or Home Care Consult Complete Complete -  SW Recovery Care/Counseling Consult Complete Complete -  Palliative Care Screening Not Applicable Not Applicable -  [place] Not Applicable Not Applicable -  Some recent data might be hidden

## 2020-09-28 NOTE — Progress Notes (Signed)
[facility] Infectious Diseases Follow Up Note  Patient Identification: Patient Name: Steve [name] MRN: [id number] [place] Date: 09/19/2020  2:39 PM Age: 65 y.o.Today's Date: 09/28/2020   Reason for Visit: MRSE bacteremia  Principal Problem:   Failure to thrive in adult Active Problems:   Pressure ulcer   Tracheostomy dependence ([name])   Chronic respiratory failure with hypoxia ([name])   Hodgkin's lymphoma ([place])   Essential hypertension   Bacteremia   Antibiotics: Vancomycin 6/11-current                    cefepime 6/11-6/15  Lines/Tubes: Gastrostomy tube, tracheostomy tube, PIV  Interval Events: Afebrile, leukocytosis has been stable  Assessment Acute on acute on chronic respiratory failure in the setting of 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   Duration would be 2 weeks from date of negative blood cultures on ( 6/14) if TEE is negative for endocarditis.  Okay to place a PICC line if needed Monitor CBC BMP and vancomycin trough Vent management per PCCM Hodgkin's lymphoma management per oncology Please call with questions or concerns.  Otherwise will sign off for now  Rest of the management as per the primary team. Thank you for the consult. Please page with pertinent questions or concerns.  ______________________________________________________________________ Subjective patient seen and examined at the bedside.  He is sitting up in bed.  Denies any discomfort or pain.  Denies any chest pain shortness of breath or cough.  Denies any nausea vomiting or diarrhea  Vitals BP 117/62   Pulse (!) 103   Temp 98.2 F (36.8 C) (Oral)   Resp (!) 32   Ht 5\' 5"  (1.651 m)   Wt 63.3 kg   SpO2 95%   BMI 23.22 kg/m      Physical Exam Constitutional: Sitting up in bed, appears to be comfortable    Comments: On vent  Cardiovascular:     Rate and Rhythm: Normal rate and regular rhythm.     Heart sounds:   Pulmonary:     Effort: Bilateral air entry with some crackles    Comments:   Abdominal:     Palpations: Abdomen is soft.     Tenderness: PEG tube present  Musculoskeletal:        General: No swelling or tenderness.   Skin:    Comments: No lesions or rashes  Neurological:     General: No focal deficit present.   Psychiatric:        Mood and Affect: Mood normal.   Pertinent Microbiology Results for orders placed or performed during the hospital encounter of 09/19/20  Urine culture     Status: Abnormal   Collection Time: 09/19/20  3:11 PM   Specimen: Urine, Clean Catch  Result Value Ref Range Status   Specimen Description   Final    URINE, CLEAN CATCH Performed at [facility], [place] [address], [place], [place] [address]    Special Requests   Final    NONE Performed at [facility], [place] [address], [place],  [address]    Culture MULTIPLE SPECIES PRESENT, SUGGEST RECOLLECTION (A)  Final   Report Status 09/21/2020 FINAL  Final  Culture, blood (routine x 2)     Status: Abnormal   Collection Time: 09/19/20  3:11 PM   Specimen: BLOOD  Result Value Ref Range  Status   Specimen Description   Final    BLOOD PORTA CATH Performed at [facility] [address] [place], [place] [address]    Special Requests   Final    BOTTLES DRAWN AEROBIC AND ANAEROBIC Blood Culture results may not be optimal due to an excessive volume of blood received in culture bottles Performed at [facility] [address] [place], [place] [address]    Culture  Setup Time   Final    GRAM POSITIVE COCCI IN BOTH AEROBIC AND ANAEROBIC BOTTLES CRITICAL RESULT CALLED TO, READ BACK BY AND VERIFIED WITH: M,SWAYNE PHARMD @1552  09/20/20  EB Performed at [facility] [address], [place], [place] [address]    Culture STAPHYLOCOCCUS EPIDERMIDIS (A)  Final   Report Status 09/22/2020 FINAL  Final   Organism ID, Bacteria STAPHYLOCOCCUS EPIDERMIDIS  Final      Susceptibility   Staphylococcus epidermidis - MIC*    CIPROFLOXACIN >=8 RESISTANT Resistant     ERYTHROMYCIN >=8 RESISTANT Resistant     GENTAMICIN 8 INTERMEDIATE Intermediate     OXACILLIN >=4 RESISTANT Resistant     TETRACYCLINE >=16 RESISTANT Resistant     VANCOMYCIN 1 SENSITIVE Sensitive     TRIMETH/SULFA 80 RESISTANT Resistant     CLINDAMYCIN >=8 RESISTANT Resistant     RIFAMPIN <=0.5 SENSITIVE Sensitive     Inducible Clindamycin NEGATIVE Sensitive     * STAPHYLOCOCCUS EPIDERMIDIS  Blood Culture ID Panel (Reflexed)     Status: Abnormal   Collection Time: 09/19/20  3:11 PM  Result Value Ref Range Status   Enterococcus faecalis NOT DETECTED NOT DETECTED Final   Enterococcus Faecium NOT DETECTED NOT DETECTED Final   Listeria monocytogenes NOT DETECTED NOT DETECTED Final   Staphylococcus species DETECTED (A) NOT DETECTED Final    Comment: CRITICAL RESULT CALLED TO, READ BACK BY AND VERIFIED WITH: M,SWAYNE PHARMD @1552  09/20/20 EB    Staphylococcus aureus (BCID) NOT DETECTED NOT DETECTED Final   Staphylococcus epidermidis DETECTED (A) NOT DETECTED Final    Comment: Methicillin (oxacillin) resistant coagulase negative staphylococcus. Possible blood culture contaminant (unless isolated from more than one blood culture draw or clinical case suggests pathogenicity). No antibiotic treatment is indicated for blood  culture contaminants. CRITICAL RESULT CALLED TO, READ BACK BY AND VERIFIED WITH: M,SWAYNE PHARMD @1552  09/20/20 EB    Staphylococcus lugdunensis NOT DETECTED NOT DETECTED Final   Streptococcus species NOT DETECTED NOT DETECTED Final   Streptococcus agalactiae NOT DETECTED NOT DETECTED Final   Streptococcus pneumoniae NOT DETECTED NOT DETECTED Final    Streptococcus pyogenes NOT DETECTED NOT DETECTED Final   A.calcoaceticus-baumannii NOT DETECTED NOT DETECTED Final   Bacteroides fragilis NOT DETECTED NOT DETECTED Final   Enterobacterales NOT DETECTED NOT DETECTED Final   Enterobacter cloacae complex NOT DETECTED NOT DETECTED Final   Escherichia coli NOT DETECTED NOT DETECTED Final   Klebsiella aerogenes NOT DETECTED NOT DETECTED Final   Klebsiella oxytoca NOT DETECTED NOT DETECTED Final   Klebsiella pneumoniae NOT DETECTED NOT DETECTED Final   Proteus species NOT DETECTED NOT DETECTED Final   Salmonella species NOT DETECTED NOT DETECTED Final   Serratia marcescens NOT DETECTED NOT DETECTED Final   Haemophilus influenzae NOT DETECTED NOT DETECTED Final   Neisseria meningitidis NOT DETECTED NOT DETECTED Final   Pseudomonas aeruginosa NOT DETECTED NOT DETECTED Final   Stenotrophomonas maltophilia NOT DETECTED NOT DETECTED Final   Candida albicans NOT DETECTED NOT DETECTED Final   Candida auris NOT DETECTED NOT  DETECTED Final   Candida glabrata NOT DETECTED NOT DETECTED Final   Candida krusei NOT DETECTED NOT DETECTED Final   Candida parapsilosis NOT DETECTED NOT DETECTED Final   Candida tropicalis NOT DETECTED NOT DETECTED Final   Cryptococcus neoformans/gattii NOT DETECTED NOT DETECTED Final   Methicillin resistance mecA/C DETECTED (A) NOT DETECTED Final    Comment: CRITICAL RESULT CALLED TO, READ BACK BY AND VERIFIED WITH: M,SWAYNE PHARMD @1552  09/20/20 EB Performed at [facility], [address] [place], [place] [address]   Culture, blood (routine x 2)     Status: Abnormal   Collection Time: 09/19/20  3:16 PM   Specimen: BLOOD  Result Value Ref Range Status   Specimen Description   Final    BLOOD SITE NOT SPECIFIED Performed at [facility] [address], [place], [place] [address]    Special Requests   Final    BOTTLES DRAWN AEROBIC AND ANAEROBIC Blood Culture adequate volume Performed at [facility] [address] [place], [place] [address]    Culture  Setup Time   Final    GRAM POSITIVE COCCI IN BOTH AEROBIC AND ANAEROBIC BOTTLES CRITICAL VALUE NOTED.  VALUE IS CONSISTENT WITH PREVIOUSLY REPORTED AND CALLED VALUE.    Culture (A)  Final    STAPHYLOCOCCUS EPIDERMIDIS SUSCEPTIBILITIES PERFORMED ON PREVIOUS CULTURE WITHIN THE LAST 5 DAYS. Performed at [facility], [place] [address], [place], [place] [address]    Report Status 09/22/2020 FINAL  Final  Resp Panel by RT-PCR (Flu A&B, Covid) Nasopharyngeal Swab     Status: None   Collection Time: 09/19/20  3:49 PM   Specimen: Nasopharyngeal Swab; Nasopharyngeal(NP) swabs in vial transport medium  Result Value Ref Range Status   SARS Coronavirus 2 by RT PCR NEGATIVE NEGATIVE Final    Comment: (NOTE) SARS-CoV-2 target nucleic acids are NOT DETECTED.  The SARS-CoV-2 RNA is generally detectable in upper respiratory specimens during the acute phase of infection. The lowest concentration of SARS-CoV-2 viral copies this assay can detect is 138 copies/mL. A negative result does not preclude SARS-Cov-2 infection and should not be used as the sole basis for treatment or other patient management decisions. A negative result may occur with  improper specimen collection/handling, submission of specimen other than nasopharyngeal swab, presence of viral mutation(s) within the areas targeted by this assay, and inadequate number of viral copies(<138 copies/mL). A negative result must be combined with clinical observations, patient history, and epidemiological information. The expected result is Negative.  Fact Sheet for Patients:  EntrepreneurPulse.com.au  Fact Sheet for Healthcare Providers:  IncredibleEmployment.be  This test is no t yet approved or cleared by the [place] FDA and  has been authorized for detection and/or diagnosis of SARS-CoV-2 by FDA under an Emergency  Use Authorization (EUA). This EUA will remain  in effect (meaning this test can be used) for the duration of the COVID-19 declaration under Section 564(b)(1) of the Act, 21 U.S.C.section 360bbb-3(b)(1), unless the authorization is terminated  or revoked sooner.       Influenza A by PCR NEGATIVE NEGATIVE Final   Influenza B by PCR NEGATIVE NEGATIVE Final    Comment: (NOTE) The Xpert Xpress SARS-CoV-2/FLU/RSV plus assay is intended as an aid in the diagnosis of influenza from Nasopharyngeal swab specimens and should not be used as a sole basis for treatment. Nasal washings and aspirates are unacceptable for Xpert Xpress SARS-CoV-2/FLU/RSV testing.  Fact Sheet for Patients: EntrepreneurPulse.com.au  Fact Sheet for  Healthcare Providers: IncredibleEmployment.be  This test is not yet approved or cleared by the [place] and has been authorized for detection and/or diagnosis of SARS-CoV-2 by FDA under an Emergency Use Authorization (EUA). This EUA will remain in effect (meaning this test can be used) for the duration of the COVID-19 declaration under Section 564(b)(1) of the Act, 21 U.S.C. section 360bbb-3(b)(1), unless the authorization is terminated or revoked.  Performed at [facility], [place] [address], [place], [place] [address]   MRSA PCR Screening     Status: None   Collection Time: 09/20/20  6:50 AM   Specimen: Nasopharyngeal  Result Value Ref Range Status   MRSA by PCR NEGATIVE NEGATIVE Final    Comment:        The GeneXpert MRSA Assay (FDA approved for NASAL specimens only), is one component of a comprehensive MRSA colonization surveillance program. It is not intended to diagnose MRSA infection nor to guide or monitor treatment for MRSA infections. Performed at [facility], [place] [address], [place], [place] [address]   Culture, blood (Routine X 2) w Reflex to ID Panel     Status: None    Collection Time: 09/22/20  7:06 PM   Specimen: BLOOD  Result Value Ref Range Status   Specimen Description   Final    BLOOD RIGHT ANTECUBITAL Performed at [facility] [address] [place], [place] [address]    Special Requests   Final    BOTTLES DRAWN AEROBIC AND ANAEROBIC Blood Culture adequate volume Performed at [facility] [address] [place], [place] [address]    Culture   Final    NO GROWTH 5 DAYS Performed at [facility], [place] [address], [place], [place] [address]    Report Status 09/27/2020 FINAL  Final  Culture, blood (Routine X 2) w Reflex to ID Panel     Status: None   Collection Time: 09/22/20  7:06 PM   Specimen: BLOOD RIGHT HAND  Result Value Ref Range Status   Specimen Description   Final    BLOOD RIGHT HAND Performed at [facility] [address] [place], [place] [address]    Special Requests   Final    BOTTLES DRAWN AEROBIC ONLY Blood Culture adequate volume Performed at [facility] [address], [place], [place] [address]    Culture   Final    NO GROWTH 5 DAYS Performed at [facility], [place] [address] [place] [address] [place], [place] [address]    Report Status 09/27/2020 FINAL  Final  MRSA PCR Screening     Status: None   Collection Time: 09/24/20 10:47 AM  Result Value Ref Range Status   MRSA by PCR NEGATIVE NEGATIVE Final    Comment:        The GeneXpert MRSA Assay (FDA approved for NASAL specimens only), is one component of a comprehensive MRSA colonization surveillance program. It is not intended to diagnose MRSA infection nor to guide or monitor treatment for MRSA infections. Performed at [facility], [place] [address] [place],  [address]     Pertinent Lab. CBC Latest Ref Rng & Units 09/28/2020 09/27/2020 09/26/2020  WBC 4.0 - 10.5 K/uL 17.8(H) 17.0(H) 17.8(H)  Hemoglobin 13.0 - 17.0 g/dL 8.0(L) 8.4(L) 8.9(L)  Hematocrit  39.0 - 52.0 % 25.7(L) 27.7(L) 29.8(L)  Platelets 150 - 400 K/uL 196 211 214   CMP Latest Ref Rng & Units 09/28/2020 09/27/2020  09/26/2020  Glucose 70 - 99 mg/dL 208(H) 179(H) 191(H)  BUN 8 - 23 mg/dL 29(H) 29(H) 24(H)  Creatinine 0.61 - 1.24 mg/dL 0.59(L) 0.50(L) 0.49(L)  Sodium 135 - 145 mmol/L 140 141 143  Potassium 3.5 - 5.1 mmol/L 4.2 4.2 4.2  Chloride 98 - 111 mmol/L 95(L) 94(L) 93(L)  CO2 22 - 32 mmol/L 40(H) 42(H) 45(H)  Calcium 8.9 - 10.3 mg/dL 8.9 9.0 9.2  Total Protein 6.5 - 8.1 g/dL - - 5.7(L)  Total Bilirubin 0.3 - 1.2 mg/dL - - 0.3  Alkaline Phos 38 - 126 U/L - - 61  AST 15 - 41 U/L - - 13(L)  ALT 0 - 44 U/L - - 18     Pertinent Imaging today Plain films and CT images have been personally visualized and interpreted; radiology reports have been reviewed. Decision making incorporated into the Impression / Recommendations.  I have spent more than 35 minutes for this patient encounter including review of prior medical records, coordination of care  with greater than 50% of time being face to face/counseling and discussing diagnostics/treatment plan with the patient/family.  Electronically signed by:   Rosiland Oz, MD Infectious Disease Physician [facility] Pager: [phone]

## 2020-09-28 NOTE — Progress Notes (Signed)
Inpatient Diabetes Program Recommendations  AACE/ADA: New Consensus Statement on Inpatient Glycemic Control (2015)  Target Ranges:  Prepandial:   less than 140 mg/dL      Peak postprandial:   less than 180 mg/dL (1-2 hours)      Critically ill patients:  140 - 180 mg/dL   Results for Steve [name], Steve [name] (MRN [id number]) as of 09/28/2020 09:35  Ref. Range 09/28/2020 00:10 09/28/2020 04:15 09/28/2020 07:50  Glucose-Capillary Latest Ref Range: 70 - 99 mg/dL 155 (H)  5 units NOVOLOG  225 (H)  7 units NOVOLOG  197 (H)  5 units NOVOLOG     Current Orders: Lantus 10 units BID     Novolog 0-15 units Q4H    Novolog 2 units Q4H    Getting Solucortef 50 mg Q8H  Remains on Vent  Tube Feeds running 65 cc/hr    MD- Please consider increasing the Novolog Tube Feed Coverage to 5 units Q4 hours    --Will follow patient during hospitalization--  Wyn Quaker RN, MSN, CDE Diabetes Coordinator Inpatient Glycemic Control Team Team Pager: [phone] (8a-5p)

## 2020-09-28 NOTE — Progress Notes (Signed)
SLP Cancellation Note  Patient Details Name: Steve [name] MRN: [id number] DOB: 1956/02/01   Cancelled treatment:       Reason Eval/Treat Not Completed: Other (comment) (No futher ST needs indicated at this time). SLP performed chart review and had secure Epic chat with critical care MD. Patient has reportedly been tolerating PO's and continues to get PEG feedings while on pressure support ventilation. SLP to s/o at this time but please reorder if new concerns regarding patient's swallow function/safety.   Sonia Baller, MA, CCC-SLP Speech Therapy

## 2020-09-28 NOTE — Progress Notes (Signed)
Steve [name] looks pretty good.  He is still on the vent.  He looks alert.  My concern is that his hemoglobin is 8.  He really needs to be transfused in my opinion.  Given that were going to probably give him chemotherapy in the next week or so, I think getting his hemoglobin better will help him.  I told him that we really cannot start chemotherapy till after he gets treated for the Staphylococcus in his blood.  His follow-up blood cultures on the 14th all looked okay.  It would be nice to get a PICC line into him.  At least he would have some kind of central access so that we could do chemotherapy when the time comes.  His blood sugars are on the high side.  This morning, the blood sugar was 208.  His kidney function is doing all right.  His BUN is 29 creatinine 0.59.  His white cell count is 17.8.  Platelet count 196,000.  Hopefully, he will be out of the ICU soon.  I do think that the blood transfusion will help him.  He did have a chest x-ray on 09/24/2020.  This showed stable pulmonary infiltrates.  Of note, he is eating he says.  He is also on tube feeds.  I know that the staff down in the ICU are giving him fantastic care.  Again he looks a lot better than I would have thought.   Lattie Haw, MD  Psalm 113:3

## 2020-09-28 NOTE — Progress Notes (Signed)
NAME:  Steve [name], MRN:  [id number], DOB:  [birth year], LOS: 55 ADMISSION DATE:  09/19/2020, CONSULTATION DATE:  09/24/20 REFERRING MD:  Dr Nolberto Hanlon, CHIEF COMPLAINT:  Resp failure   BRIEF   65 year old male who is well kwn to PCCM [place] for s/p covid, respiratoy failure, ARDS, pneumothorax, prolonged mechanical ventilation (? 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  At baseline.   Port-A-Cath placement in December 2020 and gastrostomy tube placement in May 2021.  Of note , has Hodgkins Lymphoma  with recuirrence March 2022 (Bulky intrathoracic adenopathy esp new substernal and newosseous metastatic disease, PET 06/19/2020.). As of April 2022 got 2nd dose Adcetris.  The admitted May 2022 for "pneumonia" and Acetris help (in part due to high LFT) and all Rx held.  Also as a preadmission problem: Chronic caxhexia, protein calorie malnutrition - moderate, failure thrive, DM all as his baseline prior to admission problems/ his baseline ECOG May 2022 was 2  Admitted 09/19/20 (recent baseline 8LO2 via trach). With worsening cough, lethargy.  Blood culture came back positive with Staphylococcus epidermis on 09/22/20 (all 4 of 4 cutuirs), infectious disease and s/p Port-A-Cath removal 09/22/20. As a present on admission problem O 09/22/20 - was needed 10L O2. CT scan this admit 09/20/2020 shwoed worrsening lymphadenopathy in chest concerning for Hodgkins progression. On 6/15 - Seen by Dr Marin Olp and recommended ICE protocol when better (highly immune suppressive regimen). ID had recommended 2 week Rx  . On 6/16 - reports he is more hypoxemic needeing >90% fio2 via Trach collar though no increased work of breathing. CCM consulted  Remains full code despite multiple conversations  Pertinent  Medical History  Covid admission March - May 2021   Significant Hospital  Events: Including procedures, antibiotic start and stop dates in addition to other pertinent events   6/16 - ccm consult  6/17 -CT angiogram ruled out pulmonary embolism.  He has progressive Hodgkin's disease on the CT scan of the chest.  He was unable to sustain facemask oxygen and has required being placed on pressure support ventilation through the tracheostomy.  He now has a cuffed 6 sized tracheostomy.  Currently on 50% oxygen and saturating well and more comfortable. 6/18 -stuck on pressure support ventilator at 40% FiO2.  Unable to wean off.  He is watching TV and is able to eat  Interim History / Subjective:   Remains on PSV 20/PEEP 5, 0.40.  Per report has had dyspnea and also desaturations when off MV.  Some relative hypotension to SBP 80's while sleeping, but no other manifestations of shock  Objective   Blood pressure (!) 107/59, pulse 81, temperature 97.6 F (36.4 C), temperature source Axillary, resp. rate (!) 25, height 5\' 5"  (1.651 m), weight 63.3 kg, SpO2 96 %.    Vent Mode: PSV;CPAP FiO2 (%):  [40 %] 40 % PEEP:  [5 cmH20] 5 cmH20 Pressure Support:  [20 cmH20] 20 cmH20 Plateau Pressure:  [17 cmH20-25 cmH20] 23 cmH20   Intake/Output Summary (Last 24 hours) at 09/28/2020 0847 Last data filed at 09/28/2020 2536 Gross per 24 hour  Intake 1858.53 ml  Output 1900 ml  Net -41.47 ml   Filed Weights   09/19/20 1930 09/26/20 0258  Weight: 63.3 kg 63.3 kg    Examination: Gen: ill appearing man, on MV, comfortable although a bit tachypneic HEENT: OP clear.  Neck: trach site  ok, CDI  Lungs:coarse B breath sounds, scattered rhonchi CV: regular, distant, no M Abd: non-distended, + BS Ext: trace pretibial edema Neuro: awake and alert, able to speak softly w cuff up, moves all ext, follows commands, good cough strength   Labs/imaging that I havepersonally reviewed  (right click and "Reselect all SmartList Selections" daily)   LABS    PULMONARY Recent Labs  Lab  09/24/20 1230  PHART 7.350  PCO2ART 84.3*  PO2ART 41.3*  HCO3 45.3*  O2SAT 75.5    CBC Recent Labs  Lab 09/26/20 0304 09/27/20 0334 09/28/20 0330  HGB 8.9* 8.4* 8.0*  HCT 29.8* 27.7* 25.7*  WBC 17.8* 17.0* 17.8*  PLT 214 211 196    COAGULATION No results for input(s): INR in the last 168 hours.  CARDIAC  No results for input(s): TROPONINI in the last 168 hours. No results for input(s): PROBNP in the last 168 hours.   CHEMISTRY Recent Labs  Lab 09/22/20 0536 09/23/20 1026 09/24/20 0534 09/25/20 0258 09/26/20 0304 09/27/20 0334 09/28/20 0330  NA 138   < > 141 141 143 141 140  K 4.1   < > 4.7 4.3 4.2 4.2 4.2  CL 92*   < > 95* 93* 93* 94* 95*  CO2 43*   < > 41* 41* 45* 42* 40*  GLUCOSE 326*   < > 391* 271* 191* 179* 208*  BUN 25*   < > 24* 25* 24* 29* 29*  CREATININE 0.51*   < > 0.55* 0.47* 0.49* 0.50* 0.59*  CALCIUM 9.3   < > 9.7 9.0 9.2 9.0 8.9  MG 2.1  --  2.1  --  2.1 2.0  --   PHOS 2.7  --  2.3*  --  2.7 3.4  --    < > = values in this interval not displayed.   Estimated Creatinine Clearance: 81.1 mL/min (A) (by C-G formula based on SCr of 0.59 mg/dL (L)).   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 - ECOG 3 baseline   Currently:  Acute on chronic respiratory failure with hypoxemia and hypercapnia, baseline underlying issues as described above. -Admitted for sepsis due to MRSE bacteremia.  Port removed.  ID managing antimicrobials -Post admit > acute on chronic respriatory failure with worsening hypoxemia, ultimately required MV - PE/DVT ruled out. Needing vent support PSV  - likely sepsis medidated demand on compromised lungs -Remains on PSV 20, PEEP 5 Plan -  We will attempt to begin weaning his pressure support.  He has had desaturations and complains of dyspnea when transitioned to ATC. -Unfortunately given the progression of his disease, debilitated state he may now be ventilator dependent.  At best he will require a prolonged ventilator wean, may require LTAC in order to accomplish.  Best outcome would be a transition back to ATC now that is bacteremia has been treated but we have been unable to accomplish in the setting of progressive pulmonary disease superimposed on his chronic injury.  MRSE bacteremia Plan -Vancomycin as ordered -Appreciate ID input, minimum 2 weeks therapy following last positive blood culture  Relapsed Hodgkin's lymphoma, progressive lymphadenopathy by CT chest 09/25/2020 with bulky mediastinal adenopathy, diffuse pleural interstitial and parenchymal abnormalities Plan -The patient would want continued aggressive medical care if possible.  Dr. Marin Olp is following in the event that patient can improve to a point where chemotherapy is feasible. -He would need central access, PICC line in order to facilitate.  Blood cultures from 6/14 are negative.  Can discuss with ID whether it is too early to reinstitute central IV access.     Best practice (right click and "Reselect all SmartList Selections" daily)   Per triad   [place] [facility] time 32 minutes  Baltazar Apo, MD, PhD 09/28/2020, 9:10 AM [facility] [phone] or if no answer before 7:00PM call [phone] For any issues after 7:00PM please call eLink [phone]

## 2020-09-28 NOTE — Progress Notes (Signed)
eLink Physician-Brief Progress Note Patient Name: Steve [name] DOB: 08-24-1955 MRN: [id number]   Date of Service  09/28/2020  HPI/Events of Note  Notified of BP 79/51 Patient seen asleep Adequate urine output  eICU Interventions  Does not seem to have signs of hypoperfusion Will include lactic acid to am labs     Intervention Category Intermediate Interventions: Hypotension - evaluation and management  Shona Needles Ana Liaw 09/28/2020, 2:05 AM

## 2020-09-29 DIAGNOSIS — R627 Adult failure to thrive: Secondary | ICD-10-CM | POA: Diagnosis not present

## 2020-09-29 LAB — CBC WITH DIFFERENTIAL/PLATELET
Abs Immature Granulocytes: 0.35 K/uL — ABNORMAL HIGH (ref 0.00–0.07)
Basophils Absolute: 0 K/uL (ref 0.0–0.1)
Basophils Relative: 0 %
Eosinophils Absolute: 0 K/uL (ref 0.0–0.5)
Eosinophils Relative: 0 %
HCT: 32.4 % — ABNORMAL LOW (ref 39.0–52.0)
Hemoglobin: 10.3 g/dL — ABNORMAL LOW (ref 13.0–17.0)
Immature Granulocytes: 2 %
Lymphocytes Relative: 11 %
Lymphs Abs: 1.9 K/uL (ref 0.7–4.0)
MCH: 29.1 pg (ref 26.0–34.0)
MCHC: 31.8 g/dL (ref 30.0–36.0)
MCV: 91.5 fL (ref 80.0–100.0)
Monocytes Absolute: 1.4 K/uL — ABNORMAL HIGH (ref 0.1–1.0)
Monocytes Relative: 8 %
Neutro Abs: 14.2 K/uL — ABNORMAL HIGH (ref 1.7–7.7)
Neutrophils Relative %: 79 %
Platelets: 204 K/uL (ref 150–400)
RBC: 3.54 MIL/uL — ABNORMAL LOW (ref 4.22–5.81)
RDW: 18.2 % — ABNORMAL HIGH (ref 11.5–15.5)
WBC: 18 K/uL — ABNORMAL HIGH (ref 4.0–10.5)
nRBC: 0.1 % (ref 0.0–0.2)

## 2020-09-29 LAB — BPAM RBC
Blood Product Expiration Date: 202207022359
Blood Product Expiration Date: 202207072359
ISSUE DATE / TIME: 202206201148
ISSUE DATE / TIME: 202206201745
Unit Type and Rh: 600
Unit Type and Rh: 600

## 2020-09-29 LAB — TYPE AND SCREEN
ABO/RH(D): A NEG
Antibody Screen: NEGATIVE
Unit division: 0
Unit division: 0

## 2020-09-29 LAB — RENAL FUNCTION PANEL
Albumin: 2 g/dL — ABNORMAL LOW (ref 3.5–5.0)
Anion gap: 6 (ref 5–15)
BUN: 32 mg/dL — ABNORMAL HIGH (ref 8–23)
CO2: 41 mmol/L — ABNORMAL HIGH (ref 22–32)
Calcium: 8.9 mg/dL (ref 8.9–10.3)
Chloride: 92 mmol/L — ABNORMAL LOW (ref 98–111)
Creatinine, Ser: 0.47 mg/dL — ABNORMAL LOW (ref 0.61–1.24)
GFR, Estimated: >60 mL/min (ref >60)
Glucose, Bld: 154 mg/dL — ABNORMAL HIGH (ref 70–99)
Phosphorus: 3.2 mg/dL (ref 2.5–4.6)
Potassium: 3.3 mmol/L — ABNORMAL LOW (ref 3.5–5.1)
Sodium: 139 mmol/L (ref 135–145)

## 2020-09-29 LAB — GLUCOSE, CAPILLARY
Glucose-Capillary: 190 mg/dL — ABNORMAL HIGH (ref 70–99)
Glucose-Capillary: 155 mg/dL — ABNORMAL HIGH (ref 70–99)
Glucose-Capillary: 213 mg/dL — ABNORMAL HIGH (ref 70–99)
Glucose-Capillary: 140 mg/dL — ABNORMAL HIGH (ref 70–99)
Glucose-Capillary: 244 mg/dL — ABNORMAL HIGH (ref 70–99)

## 2020-09-29 LAB — MAGNESIUM: Magnesium: 1.9 mg/dL (ref 1.7–2.4)

## 2020-09-29 LAB — HEMOGLOBIN AND HEMATOCRIT, BLOOD
HCT: 34.9 % — ABNORMAL LOW (ref 39.0–52.0)
Hemoglobin: 11.1 g/dL — ABNORMAL LOW (ref 13.0–17.0)

## 2020-09-29 LAB — VANCOMYCIN, TROUGH: Vancomycin Tr: 12 ug/mL — ABNORMAL LOW (ref 15–20)

## 2020-09-29 MED ORDER — HYDROCORTISONE NA SUCCINATE PF 100 MG IJ SOLR
50.0000 mg | Freq: Two times a day (BID) | INTRAMUSCULAR | Status: DC
  Administered 2020-09-30 – 2020-10-02 (×5): 50 mg via INTRAVENOUS
  Filled 2020-09-29 (×6): qty 2

## 2020-09-29 MED ORDER — POTASSIUM CHLORIDE CRYS ER 20 MEQ PO TBCR: 40.0000 meq | EXTENDED_RELEASE_TABLET | Freq: Once | ORAL | Status: DC

## 2020-09-29 MED ORDER — POTASSIUM CHLORIDE 20 MEQ PO PACK
40.0000 meq | PACK | Freq: Once | ORAL | Status: AC
  Administered 2020-09-29: 40 meq
  Filled 2020-09-29: qty 2

## 2020-09-29 MED ORDER — INSULIN GLARGINE 100 UNIT/ML ~~LOC~~ SOLN
20.0000 [IU] | Freq: Every day | SUBCUTANEOUS | Status: DC
  Administered 2020-09-29 – 2020-10-03 (×5): 20 [IU] via SUBCUTANEOUS
  Filled 2020-09-29 (×5): qty 0.2

## 2020-09-29 NOTE — Progress Notes (Addendum)
PHARMACY CONSULT NOTE FOR:  OUTPATIENT  PARENTERAL ANTIBIOTIC THERAPY (OPAT)  Indication: MRSE Bacteremia  Regimen: Vancomycin 750mg  IV q12h End date: 11/03/2020  IV antibiotic discharge orders are pended. To discharging provider:  please sign these orders via discharge navigator,  Select New Orders & click on the button choice - Manage This Unsigned Work.     Thank you for allowing pharmacy to be a part of this patient's care.  Phillis Haggis 09/29/2020, 8:40 AM

## 2020-09-29 NOTE — Progress Notes (Signed)
NAME:  Steve [name], MRN:  [id number], DOB:  06-05-1955, LOS: 28 ADMISSION DATE:  09/19/2020, CONSULTATION DATE:  09/24/20 REFERRING MD:  Dr Nolberto Hanlon, CHIEF COMPLAINT:  Resp failure   BRIEF   65 year old male who is well kwn [place] for s/p covid, respiratoy failure, ARDS, pneumothorax, prolonged mechanical ventilation (? 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  At baseline.   Port-A-Cath placement in December 2020 and gastrostomy tube placement in May 2021.  Of note , has Hodgkins Lymphoma  with recuirrence March 2022 (Bulky intrathoracic adenopathy esp new substernal and newosseous metastatic disease, PET 06/19/2020.). As of April 2022 got 2nd dose Adcetris.  The admitted May 2022 for "pneumonia" and Acetris help (in part due to high LFT) and all Rx held.  Also as a preadmission problem: Chronic caxhexia, protein calorie malnutrition - moderate, failure thrive, DM all as his baseline prior to admission problems/ his baseline ECOG May 2022 was 2  Admitted 09/19/20 (recent baseline 8LO2 via trach). With worsening cough, lethargy.  Blood culture came back positive with Staphylococcus epidermis on 09/22/20 (all 4 of 4 cutuirs), infectious disease and s/p Port-A-Cath removal 09/22/20. As a present on admission problem O 09/22/20 - was needed 10L O2. CT scan this admit 09/20/2020 shwoed worrsening lymphadenopathy in chest concerning for Hodgkins progression. On 6/15 - Seen by Dr Marin Olp and recommended ICE protocol when better (highly immune suppressive regimen). ID had recommended 2 week Rx  . On 6/16 - reports he is more hypoxemic needeing >90% fio2 via Trach collar though no increased work of breathing. CCM consulted  Remains full code despite multiple conversations  Pertinent  Medical History  Covid admission March - May 2021   Significant Hospital  Events: Including procedures, antibiotic start and stop dates in addition to other pertinent events   6/16 - ccm consult  6/17 -CT angiogram ruled out pulmonary embolism.  He has progressive Hodgkin's disease on the CT scan of the chest.  He was unable to sustain facemask oxygen and has required being placed on pressure support ventilation through the tracheostomy.  He now has a cuffed 6 sized tracheostomy.  Currently on 50% oxygen and saturating well and more comfortable. 6/18 -stuck on pressure support ventilator at 40% FiO2.  Unable to wean off.  He is watching TV and is able to eat  Interim History / Subjective:   He tolerated the decrease in his pressure support to 16 yesterday 6/20 Denies any respiratory distress or discomfort  Objective   Blood pressure (!) 89/57, pulse 63, temperature 97.8 F (36.6 C), temperature source Oral, resp. rate (!) 23, height 5\' 5"  (1.651 m), weight 63.3 kg, SpO2 (!) 88 %.    Vent Mode: PSV FiO2 (%):  [40 %] 40 % PEEP:  [5 cmH20] 5 cmH20 Pressure Support:  [12 cmH20-16 cmH20] 12 cmH20 Plateau Pressure:  [23 cmH20] 23 cmH20   Intake/Output Summary (Last 24 hours) at 09/29/2020 0917 Last data filed at 09/29/2020 0900 Gross per 24 hour  Intake 2293.88 ml  Output 3650 ml  Net -1356.12 ml   Filed Weights   09/19/20 1930 09/26/20 0258  Weight: 63.3 kg 63.3 kg    Examination: Gen: Chronically ill-appearing man, comfortable without respiratory distress, slightly tachypneic (stable) HEENT: Oropharynx clear Neck: Trach site clean and dry Lungs: Bilateral breath sounds with scattered rhonchi CV: Distant, regular, no murmur  Abd: Nondistended with positive bowel sounds Ext: Pretibial edema Neuro: Wake, alert, strong, able to phonate even with cuff up.  Moves all extremities, follows commands   Labs/imaging that I havepersonally reviewed  (right click and "Reselect all SmartList Selections" daily)   Labs and studies reviewed on 6/21  LABS     PULMONARY Recent Labs  Lab 09/24/20 1230  PHART 7.350  PCO2ART 84.3*  PO2ART 41.3*  HCO3 45.3*  O2SAT 75.5    CBC Recent Labs  Lab 09/27/20 0334 09/28/20 0330 09/29/20 0256 09/29/20 0646  HGB 8.4* 8.0* 11.1* 10.3*  HCT 27.7* 25.7* 34.9* 32.4*  WBC 17.0* 17.8*  --  18.0*  PLT 211 196  --  204    COAGULATION No results for input(s): INR in the last 168 hours.  CARDIAC  No results for input(s): TROPONINI in the last 168 hours. No results for input(s): PROBNP in the last 168 hours.   CHEMISTRY Recent Labs  Lab 09/24/20 0534 09/25/20 0258 09/26/20 0304 09/27/20 0334 09/28/20 0330 09/29/20 0646  NA 141 141 143 141 140 139  K 4.7 4.3 4.2 4.2 4.2 3.3*  CL 95* 93* 93* 94* 95* 92*  CO2 41* 41* 45* 42* 40* 41*  GLUCOSE 391* 271* 191* 179* 208* 154*  BUN 24* 25* 24* 29* 29* 32*  CREATININE 0.55* 0.47* 0.49* 0.50* 0.59* 0.47*  CALCIUM 9.7 9.0 9.2 9.0 8.9 8.9  MG 2.1  --  2.1 2.0  --   --   PHOS 2.3*  --  2.7 3.4  --  3.2   Estimated Creatinine Clearance: 81.1 mL/min (A) (by C-G formula based on SCr of 0.47 mg/dL (L)).   LIVER Recent Labs  Lab 09/23/20 1026 09/24/20 0534 09/25/20 0258 09/26/20 0304 09/27/20 0334 09/29/20 0646  AST 11* 12* 13* 13*  --   --   ALT 19 17 18 18   --   --   ALKPHOS 73 73 59 61  --   --   BILITOT 0.3 0.2* 0.4 0.3  --   --   PROT 6.3* 6.4* 5.5* 5.7*  --   --   ALBUMIN 2.1* 2.2* 2.0* 2.0* 1.9* 2.0*     INFECTIOUS Recent Labs  Lab 09/28/20 0330  LATICACIDVEN 0.9      ENDOCRINE CBG (last 3)  Recent Labs    09/28/20 2306 09/29/20 0258 09/29/20 0741  GLUCAP 135* 190* 155*      Resolved Hospital Problem list   x   Assessment & Plan:  Baseline:   -Chronic hypoxemic respiratory failure 8 L oxygen via tracheostomy -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 - ECOG 3 baseline   Currently:  Acute on chronic respiratory failure with hypoxemia and hypercapnia, baseline underlying issues as described above. -Admitted for sepsis due to MRSE bacteremia.  Port removed.  ID managing antimicrobials -Post admit > acute on chronic respriatory failure with worsening hypoxemia, ultimately required MV - PE/DVT ruled out. Needing vent support PSV  - likely sepsis medidated demand on compromised lungs -Tolerated decrease PSV to 16 on 6/21 Plan -Wean pressure support again on 6/21, try to go to 12.  I will continue to work him down as long as he tolerates.  Goal will be  to get to ATC -Hopefully he will continue to tolerate decreasing support, will not be ventilator dependent.  He is at risk for this given his overall disease progression, debilitated state.  If that happens then he would likely need LTAC care or vent-SNF care.  Would likely be a barrier to any more chemotherapy, which is his goal  MRSE bacteremia Plan -Vancomycin as ordered -Appreciate ID input, planning for minimum 2 weeks therapy following his last positive blood culture   Relapsed Hodgkin's lymphoma, progressive lymphadenopathy by CT chest 09/25/2020 with bulky mediastinal adenopathy, diffuse pleural interstitial and parenchymal abnormalities Plan -The patient would want continued aggressive medical care if possible.  Dr. Marin Olp is following in the event that patient can improve to a point where chemotherapy is feasible. -He would need central access, PICC line in order to facilitate.  Blood cultures from 6/14 are negative.  Would need to determine timing of PICC line placement based on his culture data, ID recommendations.    Best practice (right click and "Reselect all SmartList Selections" daily)  Per triad   [place] [facility] time 31 minutes  Baltazar Apo, MD, PhD 09/29/2020, 9:17 AM [facility] [phone] or if no answer before 7:00PM call [phone] For any issues after 7:00PM please call eLink [phone]

## 2020-09-29 NOTE — Progress Notes (Signed)
Pharmacy Antibiotic Note  Steve [name] is a 65 y.o. male admitted on 09/19/2020 with possible pneumonia, found to have staph epidermidis bacteremia. Pharmacy has been consulted for Vancomycin dosing.  Scr stable, peak drawn ~1 late and trough ~30 mins early but levels adjusted for calculation. Patient specific kinetics ke: 0.0902, T1/2 7.7 hours, Vd 36.4L  6/20-6/21 levels Vancomycin peak = 24 Vancomycin trough = 12 Calculated AUC = 456 (goal 400-550)  Plan: Continue Vancomycin 750mg  q12h. Continue to monitor renal function, recheck levels as needed OPAT abx through 6/28  Height: 5\' 5"  (165.1 cm) Weight: 63.3 kg (139 lb 8.8 oz) IBW/kg (Calculated) : 61.5  Temp (24hrs), Avg:97.9 F (36.6 C), Min:96.7 F (35.9 C), Max:98.6 F (37 C)  Recent Labs  Lab 09/22/20 1643 09/23/20 1026 09/25/20 0258 09/26/20 0304 09/27/20 0334 09/28/20 0330 09/28/20 1915 09/29/20 0256 09/29/20 0646  WBC  --    < > 16.9* 17.8* 17.0* 17.8*  --   --  18.0*  CREATININE  --    < > 0.47* 0.49* 0.50* 0.59*  --   --  0.47*  LATICACIDVEN  --   --   --   --   --  0.9  --   --   --   VANCOTROUGH 10*  --   --   --   --   --   --  12*  --   VANCOPEAK  --   --   --   --   --   --  24*  --   --    < > = values in this interval not displayed.     Estimated Creatinine Clearance: 81.1 mL/min (A) (by C-G formula based on SCr of 0.47 mg/dL (L)).    No Known Allergies  Antimicrobials this admission: 6/11 Vancomycin >> 6/12, resumed 6/12 >> 6/11 Cefepime >> 6/15  Dose adjustments this admission: 6/14 0536 VP: 37, 1530 VT: 10  Microbiology results: 6/11 BCx: 4/4 bottles Staph epi (methicillin resistance detected)  6/11 UCx: multiple species - final Sputum:  ordered, not collected 6/11 Strep pneumo urinary antigen: neg 6/11 Legionella urinary antigen: neg 6/12 MRSA PCR: neg 6/14 BCx: NGTD  6/16 MRSA PCR: neg  Thank you for allowing pharmacy to be a part of this patient's care.  Nicoletta Dress,  PharmD, BCIDP Infectious Disease Pharmacist  Phone: [phone] 09/29/2020, 8:37 AM

## 2020-09-29 NOTE — Progress Notes (Addendum)
PROGRESS NOTE  Steve [name]:[id number] DOB: Oct 31, 1955   PCP: [name], PA-C  Patient is from: Home.  Lives with his wife.  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   On 09/24/2020, patient developed respiratory distress with increased oxygen requirement, and transferred to ICU and started on pressure support ventilation.  Palliative medicine and infectious disease following.  Remains full code with full scope of care.   Subjective: Seen and examined earlier this morning.  No complaints this morning.  He denies pain. Says breathing is stable.  Objective: Vitals:   09/29/20 0830 09/29/20 0842 09/29/20 1200 09/29/20 1203  BP:      Pulse:      Resp:      Temp:   98.4 F (36.9 C)   TempSrc:   Oral   SpO2: 92% (!) 88%  95%  Weight:      Height:        Intake/Output Summary (Last 24 hours) at 09/29/2020 1332 Last data filed at 09/29/2020 1236 Gross per 24 hour  Intake 1978.88 ml  Output 2950 ml  Net -971.12 ml   Filed Weights   09/19/20 1930 09/26/20 0258  Weight: 63.3 kg 63.3 kg    Examination:  GENERAL: No apparent distress.  Nontoxic. HEENT: MMM.  Vision and hearing grossly intact.  NECK: Tracheostomy in place. RESP: On PS MV via trach. Fair aeration bilaterally. CVS:  RRR. Heart sounds normal.  ABD/GI/GU: BS+. Abd soft, NTND.  MSK/EXT:  Moves extremities.  Some muscle mass and subcu fat loss. SKIN: no apparent skin lesion or wound NEURO: Awake and alert. Oriented appropriately.  No apparent focal neuro  deficit. PSYCH: Calm. Normal affect.   Procedures:  None  Microbiology summarized: [id number]-17 and influenza PCR nonreactive. MRSA PCR screen negative. 6/11-blood culture with staph epidermis-MecA/C 6/14-blood cultures NGTD.  Assessment & Plan: Sepsis due to pneumonia/staph epidermis bacteremia: POA. Had Leukocytosis, tachycardia pia and tachycardia on presentation.  CXR showed possible recurrent pneumonia but difficult to interpret.  CT chest showed interval increase in bulk of mediastinal and axillary adenopathy.  Culture data as above.  Port a cath removed on 6/16. -IV cefepime 6/11-6/15 -ID recommends IV Vanco for minimum of 2 weeks from 6/14 (first negative cultures) if not able to rule out endocarditis. -ID recommended TEE to exclude endocarditis, have discussed w/ cardiology, is scheduled for TEE 6/24, will need to be NPO the midnight prior - Continue midodrine  - has been on stress dose steroids since 6/11 (hydrocortisone 50 q8). Discussed w/ pccm, agrees with now tapering. Will space out to 50 IV q12.   Hypotension likely arterial insufficiency from chronic steroid in the setting of sepsis. -On midodrine and stress dose steroid.  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   CXR and CT chest as above. -On PS MV since 6/16.  PCCM managing.   Relapsed Hodgkin's lymphoma: CT chest concerning for worsening LAD and progression. -Oncology following. - will need central venous access  at some point to resume chemotherapy   Uncontrolled DM-2 with hyperglycemia: Hyperglycemia likely due to steroid. Recent Labs  Lab 09/28/20 1933 09/28/20 2306 09/29/20 0258 09/29/20 0741 09/29/20 1147  GLUCAP 187* 135* 190* 155* 213*  -Continue SSI to moderate every 4 hours. -Increase NovoLog from 2 to 4 units every 4 hours -consolidate lantus to 20 qd -Further adjustment  as appropriate  Normocytic anemia/anemia of chronic disease: Anemia panel consistent with this. Recent Labs    09/20/20 1015 09/22/20 0536 09/23/20 1026 09/24/20 0534 09/25/20 0258 09/26/20 0304 09/27/20 0334 09/28/20 0330 09/29/20 0256 09/29/20 0646  HGB 10.2* 9.1* 9.9* 10.0* 8.7* 8.9* 8.4* 8.0* 11.1* 10.3*  -appropriate h/h increase after 1 unit ordered by onc -Continue monitoring.  Hyponatremia: Resolved.  Hypokalemia 3.3, mg wnl - 40 meq kcl ordered  Metabolic alkalosis: CO2 retention?  He is on mechanical ventilation. -Continue monitoring  Leukocytosis/bandemia: Likely due to steroid versus sepsis. -Monitor  Severe protein calorie malnutrition: POA. Tolerating some by mouth, also receiving tube feeds while mechanically ventilated  Body mass index is 23.22 kg/m. Nutrition Problem: Severe Malnutrition Etiology: chronic illness, cancer and cancer related treatments Signs/Symptoms: severe fat depletion, severe muscle depletion Interventions: Tube feeding, Prostat, MVI   enoxaparin (LOVENOX) injection 40 mg Start: 09/19/20 2200  Code Status: Full code Family Communication: Patient and/or RN. Available if any question.  Level of care: ICU Status is: Inpatient  Remains inpatient appropriate because:Hemodynamically unstable, Unsafe d/c plan, IV treatments appropriate due to intensity of illness or inability to take PO, and Inpatient level of care appropriate due to severity of illness  Dispo: The patient is from: Home              Anticipated d/c is to: TBD              Patient currently is not medically stable to d/c.   Difficult to place patient No       Consultants:  Pulmonology Hematology/oncology Palliative medicine Infectious disease   Sch Meds:  Scheduled Meds:  Chlorhexidine Gluconate Cloth  6 each Topical Q0600   cholecalciferol  10,000 Units Oral Daily   enoxaparin (LOVENOX) injection  40 mg Subcutaneous Q24H   free water  100 mL Per  Tube Q6H   hydrocortisone sod succinate (SOLU-CORTEF) inj  50 mg Intravenous Q8H   insulin aspart  0-15 Units Subcutaneous Q4H   insulin aspart  4 Units Subcutaneous Q4H   insulin glargine  10 Units Subcutaneous BID   mouth rinse  15 mL Mouth Rinse 10 times per day   midodrine  5 mg Per Tube TID WC   multivitamin  15 mL Per Tube Daily   pantoprazole sodium  40 mg Per Tube Daily   sertraline  50 mg Per Tube Daily   traZODone  100 mg Per Tube QHS   Continuous Infusions:  feeding supplement (VITAL AF 1.2 CAL) 1,000 mL (09/29/20 1310)   vancomycin Stopped (09/29/20 0404)   PRN Meds:.albuterol, guaiFENesin, hyoscyamine, LORazepam  Antimicrobials: Anti-infectives (From admission, onward)    Start     Dose/Rate Route Frequency Ordered Stop   09/23/20 0800  ceFEPIme (MAXIPIME) 2 g in sodium chloride 0.9 % 100 mL IVPB  Status:  Discontinued        2 g 200 mL/hr over 30 Minutes Intravenous Every 8 hours 09/23/20 0738 09/23/20 1758   09/21/20 0400  vancomycin (VANCOREADY) IVPB 750 mg/150 mL        750 mg 150 mL/hr over 60 Minutes  Intravenous Every 12 hours 09/20/20 1715     09/20/20 1730  vancomycin (VANCOREADY) IVPB 750 mg/150 mL        750 mg 150 mL/hr over 60 Minutes Intravenous STAT 09/20/20 1714 09/20/20 1954   09/20/20 0400  vancomycin (VANCOREADY) IVPB 750 mg/150 mL  Status:  Discontinued        750 mg 150 mL/hr over 60 Minutes Intravenous Every 12 hours 09/19/20 1932 09/20/20 1155   09/20/20 0000  ceFEPIme (MAXIPIME) 2 g in sodium chloride 0.9 % 100 mL IVPB  Status:  Discontinued        2 g 200 mL/hr over 30 Minutes Intravenous Every 8 hours 09/19/20 1932 09/23/20 0721   09/19/20 1600  ceFEPIme (MAXIPIME) 2 g in sodium chloride 0.9 % 100 mL IVPB        2 g 200 mL/hr over 30 Minutes Intravenous  Once 09/19/20 1548 09/19/20 1634   09/19/20 1600  vancomycin (VANCOREADY) IVPB 1250 mg/250 mL        1,250 mg 166.7 mL/hr over 90 Minutes Intravenous STAT 09/19/20 1557 09/19/20 1822         I have personally reviewed the following labs and images: CBC: Recent Labs  Lab 09/23/20 1026 09/24/20 0534 09/25/20 0258 09/26/20 0304 09/27/20 0334 09/28/20 0330 09/29/20 0256 09/29/20 0646  WBC 19.2* 20.4* 16.9* 17.8* 17.0* 17.8*  --  18.0*  NEUTROABS 16.0* 16.4* 13.8* 15.4*  --   --   --  14.2*  HGB 9.9* 10.0* 8.7* 8.9* 8.4* 8.0* 11.1* 10.3*  HCT 34.0* 35.3* 29.9* 29.8* 27.7* 25.7* 34.9* 32.4*  MCV 99.7 102.0* 99.0 96.1 94.5 93.8  --  91.5  PLT 236 218 202 214 211 196  --  204   BMP &GFR Recent Labs  Lab 09/24/20 0534 09/25/20 0258 09/26/20 0304 09/27/20 0334 09/28/20 0330 09/29/20 0646  NA 141 141 143 141 140 139  K 4.7 4.3 4.2 4.2 4.2 3.3*  CL 95* 93* 93* 94* 95* 92*  CO2 41* 41* 45* 42* 40* 41*  GLUCOSE 391* 271* 191* 179* 208* 154*  BUN 24* 25* 24* 29* 29* 32*  CREATININE 0.55* 0.47* 0.49* 0.50* 0.59* 0.47*  CALCIUM 9.7 9.0 9.2 9.0 8.9 8.9  MG 2.1  --  2.1 2.0  --  1.9  PHOS 2.3*  --  2.7 3.4  --  3.2   Estimated Creatinine Clearance: 81.1 mL/min (A) (by C-G formula based on SCr of 0.47 mg/dL (L)). Liver & Pancreas: Recent Labs  Lab 09/23/20 1026 09/24/20 0534 09/25/20 0258 09/26/20 0304 09/27/20 0334 09/29/20 0646  AST 11* 12* 13* 13*  --   --   ALT 19 17 18 18   --   --   ALKPHOS 73 73 59 61  --   --   BILITOT 0.3 0.2* 0.4 0.3  --   --   PROT 6.3* 6.4* 5.5* 5.7*  --   --   ALBUMIN 2.1* 2.2* 2.0* 2.0* 1.9* 2.0*   No results for input(s): LIPASE, AMYLASE in the last 168 hours. No results for input(s): AMMONIA in the last 168 hours. Diabetic: No results for input(s): HGBA1C in the last 72 hours. Recent Labs  Lab 09/28/20 1933 09/28/20 2306 09/29/20 0258 09/29/20 0741 09/29/20 1147  GLUCAP 187* 135* 190* 155* 213*   Cardiac Enzymes: No results for input(s): CKTOTAL, CKMB, CKMBINDEX, TROPONINI in the last 168 hours.  No results for input(s): PROBNP in the last 8760 hours. Coagulation Profile: No results for input(s):  INR,  PROTIME in the last 168 hours. Thyroid Function Tests: No results for input(s): TSH, T4TOTAL, FREET4, T3FREE, THYROIDAB in the last 72 hours. Lipid Profile: No results for input(s): CHOL, HDL, LDLCALC, TRIG, CHOLHDL, LDLDIRECT in the last 72 hours. Anemia Panel: No results for input(s): VITAMINB12, FOLATE, FERRITIN, TIBC, IRON, RETICCTPCT in the last 72 hours.  Urine analysis:    Component Value Date/Time   COLORURINE YELLOW 09/19/2020 1511   APPEARANCEUR HAZY (A) 09/19/2020 1511   LABSPEC 1.012 09/19/2020 1511   PHURINE 7.0 09/19/2020 1511   GLUCOSEU NEGATIVE 09/19/2020 1511   HGBUR NEGATIVE 09/19/2020 1511   HGBUR negative 03/23/2007 0000   BILIRUBINUR NEGATIVE 09/19/2020 1511   BILIRUBINUR neg 11/11/2016 1017   KETONESUR NEGATIVE 09/19/2020 1511   PROTEINUR NEGATIVE 09/19/2020 1511   UROBILINOGEN 0.2 11/11/2016 1017   UROBILINOGEN 0.2 11/08/2012 0915   NITRITE NEGATIVE 09/19/2020 1511   LEUKOCYTESUR NEGATIVE 09/19/2020 1511   Sepsis Labs: Invalid input(s): PROCALCITONIN, [place]  Microbiology: Recent Results (from the past 240 hour(s))  Urine culture     Status: Abnormal   Collection Time: 09/19/20  3:11 PM   Specimen: Urine, Clean Catch  Result Value Ref Range Status   Specimen Description   Final    URINE, CLEAN CATCH Performed at [facility], [place] [address], [place], [place] [address]    Special Requests   Final    NONE Performed at [facility], [place] [address] [place], [place] [address]    Culture MULTIPLE SPECIES PRESENT, SUGGEST RECOLLECTION (A)  Final   Report Status 09/21/2020 FINAL  Final  Culture, blood (routine x 2)     Status: Abnormal   Collection Time: 09/19/20  3:11 PM   Specimen: BLOOD  Result Value Ref Range Status   Specimen Description   Final    BLOOD PORTA CATH Performed at [facility] [place] [address], [place], [place] [address]    Special Requests   Final    BOTTLES DRAWN  AEROBIC AND ANAEROBIC Blood Culture results may not be optimal due to an excessive volume of blood received in culture bottles Performed at [facility] [address] [place], [place] [address]    Culture  Setup Time   Final    GRAM POSITIVE COCCI IN BOTH AEROBIC AND ANAEROBIC BOTTLES CRITICAL RESULT CALLED TO, READ BACK BY AND VERIFIED WITH: M,SWAYNE PHARMD @1552  09/20/20 EB Performed at [place] [address] [place], [place] [address]    Culture STAPHYLOCOCCUS EPIDERMIDIS (A)  Final   Report Status 09/22/2020 FINAL  Final   Organism ID, Bacteria STAPHYLOCOCCUS EPIDERMIDIS  Final      Susceptibility   Staphylococcus epidermidis - MIC*    CIPROFLOXACIN >=8 RESISTANT Resistant     ERYTHROMYCIN >=8 RESISTANT Resistant     GENTAMICIN 8 INTERMEDIATE Intermediate     OXACILLIN >=4 RESISTANT Resistant     TETRACYCLINE >=16 RESISTANT Resistant     VANCOMYCIN 1 SENSITIVE Sensitive     TRIMETH/SULFA 80 RESISTANT Resistant     CLINDAMYCIN >=8 RESISTANT Resistant     RIFAMPIN <=0.5 SENSITIVE Sensitive     Inducible Clindamycin NEGATIVE Sensitive     * STAPHYLOCOCCUS EPIDERMIDIS  Blood Culture ID Panel (Reflexed)     Status: Abnormal   Collection Time: 09/19/20  3:11 PM  Result Value Ref Range Status   Enterococcus faecalis NOT DETECTED NOT DETECTED Final   Enterococcus Faecium NOT DETECTED NOT DETECTED Final  Listeria monocytogenes NOT DETECTED NOT DETECTED Final   Staphylococcus species DETECTED (A) NOT DETECTED Final    Comment: CRITICAL RESULT CALLED TO, READ BACK BY AND VERIFIED WITH: M,SWAYNE PHARMD @1552  09/20/20 EB    Staphylococcus aureus (BCID) NOT DETECTED NOT DETECTED Final   Staphylococcus epidermidis DETECTED (A) NOT DETECTED Final    Comment: Methicillin (oxacillin) resistant coagulase negative staphylococcus. Possible blood culture contaminant (unless isolated from more than one blood culture draw or clinical case suggests pathogenicity).  No antibiotic treatment is indicated for blood  culture contaminants. CRITICAL RESULT CALLED TO, READ BACK BY AND VERIFIED WITH: M,SWAYNE PHARMD @1552  09/20/20 EB    Staphylococcus lugdunensis NOT DETECTED NOT DETECTED Final   Streptococcus species NOT DETECTED NOT DETECTED Final   Streptococcus agalactiae NOT DETECTED NOT DETECTED Final   Streptococcus pneumoniae NOT DETECTED NOT DETECTED Final   Streptococcus pyogenes NOT DETECTED NOT DETECTED Final   A.calcoaceticus-baumannii NOT DETECTED NOT DETECTED Final   Bacteroides fragilis NOT DETECTED NOT DETECTED Final   Enterobacterales NOT DETECTED NOT DETECTED Final   Enterobacter cloacae complex NOT DETECTED NOT DETECTED Final   Escherichia coli NOT DETECTED NOT DETECTED Final   Klebsiella aerogenes NOT DETECTED NOT DETECTED Final   Klebsiella oxytoca NOT DETECTED NOT DETECTED Final   Klebsiella pneumoniae NOT DETECTED NOT DETECTED Final   Proteus species NOT DETECTED NOT DETECTED Final   Salmonella species NOT DETECTED NOT DETECTED Final   Serratia marcescens NOT DETECTED NOT DETECTED Final   Haemophilus influenzae NOT DETECTED NOT DETECTED Final   Neisseria meningitidis NOT DETECTED NOT DETECTED Final   Pseudomonas aeruginosa NOT DETECTED NOT DETECTED Final   Stenotrophomonas maltophilia NOT DETECTED NOT DETECTED Final   Candida albicans NOT DETECTED NOT DETECTED Final   Candida auris NOT DETECTED NOT DETECTED Final   Candida glabrata NOT DETECTED NOT DETECTED Final   Candida krusei NOT DETECTED NOT DETECTED Final   Candida parapsilosis NOT DETECTED NOT DETECTED Final   Candida tropicalis NOT DETECTED NOT DETECTED Final   Cryptococcus neoformans/gattii NOT DETECTED NOT DETECTED Final   Methicillin resistance mecA/C DETECTED (A) NOT DETECTED Final    Comment: CRITICAL RESULT CALLED TO, READ BACK BY AND VERIFIED WITH: M,SWAYNE PHARMD @1552  09/20/20 EB Performed at [facility], [address] [place], [place] [address]    Culture, blood (routine x 2)     Status: Abnormal   Collection Time: 09/19/20  3:16 PM   Specimen: BLOOD  Result Value Ref Range Status   Specimen Description   Final    BLOOD SITE NOT SPECIFIED Performed at [place] [address] [place], [place] [address]    Special Requests   Final    BOTTLES DRAWN AEROBIC AND ANAEROBIC Blood Culture adequate volume Performed at [facility] [address] [place], [place] [address]    Culture  Setup Time   Final    GRAM POSITIVE COCCI IN BOTH AEROBIC AND ANAEROBIC BOTTLES CRITICAL VALUE NOTED.  VALUE IS CONSISTENT WITH PREVIOUSLY REPORTED AND CALLED VALUE.    Culture (A)  Final    STAPHYLOCOCCUS EPIDERMIDIS SUSCEPTIBILITIES PERFORMED ON PREVIOUS CULTURE WITHIN THE LAST 5 DAYS. Performed at [facility], [place] [address], [place], [place] [address]    Report Status 09/22/2020 FINAL  Final  Resp Panel by RT-PCR (Flu A&B, Covid) Nasopharyngeal Swab     Status: None   Collection Time: 09/19/20  3:49 PM   Specimen: Nasopharyngeal Swab; Nasopharyngeal(NP) swabs in vial transport medium  Result Value Ref Range Status   SARS Coronavirus 2 by RT PCR NEGATIVE NEGATIVE Final    Comment: (NOTE) SARS-CoV-2 target nucleic acids are NOT DETECTED.  The SARS-CoV-2 RNA is generally detectable in upper respiratory specimens during the acute phase of infection. The lowest concentration of SARS-CoV-2 viral copies this assay can detect is 138 copies/mL. A negative result does not preclude SARS-Cov-2 infection and should not be used as the sole basis for treatment or other patient management decisions. A negative result may occur with  improper specimen collection/handling, submission of specimen other than nasopharyngeal swab, presence of viral mutation(s) within the areas targeted by this assay, and inadequate number of viral copies(<138 copies/mL). A negative result must be combined with clinical  observations, patient history, and epidemiological information. The expected result is Negative.  Fact Sheet for Patients:  EntrepreneurPulse.com.au  Fact Sheet for Healthcare Providers:  IncredibleEmployment.be  This test is no t yet approved or cleared by the [place] FDA and  has been authorized for detection and/or diagnosis of SARS-CoV-2 by FDA under an Emergency Use Authorization (EUA). This EUA will remain  in effect (meaning this test can be used) for the duration of the COVID-19 declaration under Section 564(b)(1) of the Act, 21 U.S.C.section 360bbb-3(b)(1), unless the authorization is terminated  or revoked sooner.       Influenza A by PCR NEGATIVE NEGATIVE Final   Influenza B by PCR NEGATIVE NEGATIVE Final    Comment: (NOTE) The Xpert Xpress SARS-CoV-2/FLU/RSV plus assay is intended as an aid in the diagnosis of influenza from Nasopharyngeal swab specimens and should not be used as a sole basis for treatment. Nasal washings and aspirates are unacceptable for Xpert Xpress SARS-CoV-2/FLU/RSV testing.  Fact Sheet for Patients: EntrepreneurPulse.com.au  Fact Sheet for Healthcare Providers: IncredibleEmployment.be  This test is not yet approved or cleared by the [place] FDA and has been authorized for detection and/or diagnosis of SARS-CoV-2 by FDA under an Emergency Use Authorization (EUA). This EUA will remain in effect (meaning this test can be used) for the duration of the COVID-19 declaration under Section 564(b)(1) of the Act, 21 U.S.C. section 360bbb-3(b)(1), unless the authorization is terminated or revoked.  Performed at [facility], [place] [address] [place], [place] [address]   MRSA PCR Screening     Status: None   Collection Time: 09/20/20  6:50 AM   Specimen: Nasopharyngeal  Result Value Ref Range Status   MRSA by PCR NEGATIVE NEGATIVE Final     Comment:        The GeneXpert MRSA Assay (FDA approved for NASAL specimens only), is one component of a comprehensive MRSA colonization surveillance program. It is not intended to diagnose MRSA infection nor to guide or monitor treatment for MRSA infections. Performed at [facility], [place] [address], [place], [place] [address]   Culture, blood (Routine X 2) w Reflex to ID Panel     Status: None   Collection Time: 09/22/20  7:06 PM   Specimen: BLOOD  Result Value Ref Range Status   Specimen Description   Final    BLOOD RIGHT ANTECUBITAL Performed at [facility] [address] [place], [place] [address]    Special Requests   Final    BOTTLES DRAWN AEROBIC AND ANAEROBIC Blood Culture adequate volume Performed at [facility] [place] [address], [place], [place] [address]    Culture   Final    NO GROWTH 5 DAYS Performed at [facility], [place] [address] [place], [place] [address]    Report Status 09/27/2020 FINAL  Final  Culture, blood (Routine X 2) w Reflex to ID Panel     Status: None   Collection Time: 09/22/20  7:06 PM   Specimen: BLOOD RIGHT HAND  Result Value Ref Range Status   Specimen Description   Final    BLOOD RIGHT HAND Performed at [place] [facility] [address] [place], [place] [address]    Special Requests   Final    BOTTLES DRAWN AEROBIC ONLY Blood Culture adequate volume Performed at [facility] [address] [place], [place] [address]    Culture   Final    NO GROWTH 5 DAYS Performed at [facility], [place] [address], [place], [place] [address]    Report Status 09/27/2020 FINAL  Final  MRSA PCR Screening     Status: None   Collection Time: 09/24/20 10:47 AM  Result Value Ref Range Status   MRSA by PCR NEGATIVE NEGATIVE Final    Comment:        The GeneXpert MRSA Assay (FDA approved for NASAL specimens only), is one component of a comprehensive  MRSA colonization surveillance program. It is not intended to diagnose MRSA infection nor to guide or monitor treatment for MRSA infections. Performed at [facility], [place] [address] [place], [place] [address]     Radiology Studies: No results found.    [name], MD [facility]  If 7PM-7AM, please contact night-coverage [email or web address] 09/29/2020, 1:32 PM

## 2020-09-30 DIAGNOSIS — R627 Adult failure to thrive: Secondary | ICD-10-CM | POA: Diagnosis not present

## 2020-09-30 LAB — RENAL FUNCTION PANEL
Albumin: 2 g/dL — ABNORMAL LOW (ref 3.5–5.0)
Anion gap: 5 (ref 5–15)
BUN: 30 mg/dL — ABNORMAL HIGH (ref 8–23)
CO2: 44 mmol/L — ABNORMAL HIGH (ref 22–32)
Calcium: 9.4 mg/dL (ref 8.9–10.3)
Chloride: 93 mmol/L — ABNORMAL LOW (ref 98–111)
Creatinine, Ser: 0.55 mg/dL — ABNORMAL LOW (ref 0.61–1.24)
GFR, Estimated: >60 mL/min (ref >60)
Glucose, Bld: 103 mg/dL — ABNORMAL HIGH (ref 70–99)
Phosphorus: 2.7 mg/dL (ref 2.5–4.6)
Potassium: 3.8 mmol/L (ref 3.5–5.1)
Sodium: 142 mmol/L (ref 135–145)

## 2020-09-30 LAB — GLUCOSE, CAPILLARY
Glucose-Capillary: 103 mg/dL — ABNORMAL HIGH (ref 70–99)
Glucose-Capillary: 105 mg/dL — ABNORMAL HIGH (ref 70–99)
Glucose-Capillary: 110 mg/dL — ABNORMAL HIGH (ref 70–99)
Glucose-Capillary: 152 mg/dL — ABNORMAL HIGH (ref 70–99)
Glucose-Capillary: 209 mg/dL — ABNORMAL HIGH (ref 70–99)
Glucose-Capillary: 242 mg/dL — ABNORMAL HIGH (ref 70–99)
Glucose-Capillary: 83 mg/dL (ref 70–99)
Glucose-Capillary: 92 mg/dL (ref 70–99)

## 2020-09-30 LAB — CBC
HCT: 35.7 % — ABNORMAL LOW (ref 39.0–52.0)
Hemoglobin: 11 g/dL — ABNORMAL LOW (ref 13.0–17.0)
MCH: 29.2 pg (ref 26.0–34.0)
MCHC: 30.8 g/dL (ref 30.0–36.0)
MCV: 94.7 fL (ref 80.0–100.0)
Platelets: 245 10*3/uL (ref 150–400)
RBC: 3.77 MIL/uL — ABNORMAL LOW (ref 4.22–5.81)
RDW: 18.2 % — ABNORMAL HIGH (ref 11.5–15.5)
WBC: 17.8 10*3/uL — ABNORMAL HIGH (ref 4.0–10.5)
nRBC: 0 % (ref 0.0–0.2)

## 2020-09-30 MED ORDER — LORAZEPAM 0.5 MG PO TABS: 0.5000 mg | ORAL_TABLET | Freq: Four times a day (QID) | ORAL | Status: DC | PRN

## 2020-09-30 MED ORDER — CHLORHEXIDINE GLUCONATE 0.12% ORAL RINSE (MEDLINE KIT)
15.0000 mL | Freq: Two times a day (BID) | OROMUCOSAL | Status: DC
  Administered 2020-09-30 – 2020-10-26 (×46): 15 mL via OROMUCOSAL

## 2020-09-30 MED ORDER — LORAZEPAM 0.5 MG PO TABS
0.5000 mg | ORAL_TABLET | Freq: Four times a day (QID) | ORAL | Status: DC | PRN
  Administered 2020-09-30 – 2020-10-06 (×2): 0.5 mg
  Filled 2020-09-30 (×2): qty 1

## 2020-09-30 NOTE — TOC Progression Note (Signed)
Transition of Care [facility]) - Progression Note    Patient Details  Name: Steve [name] MRN: [id number] Date of Birth: Jul 08, 1955  Transition of [facility] CM/SW Contact  Leeroy Cha, RN Phone Number: 09/30/2020, 8:47 AM  Clinical Narrative:    Significant Hospital Events: Including procedures, antibiotic start and stop dates in addition to other pertinent events  6/16 - ccm consult   6/17 -CT angiogram ruled out pulmonary embolism.  He has progressive Hodgkin's disease on the CT scan of the chest.  He was unable to sustain facemask oxygen and has required being placed on pressure support ventilation through the tracheostomy.  He now has a cuffed 6 sized tracheostomy.  Currently on 50% oxygen and saturating well and more comfortable. 6/18 -stuck on pressure support ventilator at 40% FiO2.  Unable to wean off.  He is watching TV and is able to eat 6/22 slowly progressing PSV wean Toc plan: Following for progression and toc needs.  May need snf placement for rehab    Expected Discharge Plan: [place] Barriers to Discharge: Continued Medical Work up  Expected Discharge Plan and Services Expected Discharge Plan: [place] In-house Referral: Clinical Social Work Discharge Planning Services: CM Consult Post Acute Care Choice: [place] arrangements for the past 2 months: Mobile Home                 DME Arranged: N/A DME Agency: NA       HH Arranged: PT [place]: [place] ([place]) Date [place]: 09/22/20   Representative spoke with at [place]: [place] (SDOH) Interventions    Readmission Risk Interventions Readmission Risk Prevention Plan 09/22/2020 08/24/2020 07/22/2019  Transportation Screening Complete Complete Complete  Medication Review Press photographer) Complete Complete Complete  HRI or Home Care Consult Complete Complete -  SW Recovery Care/Counseling  Consult Complete Complete -  Palliative Care Screening Not Applicable Not Applicable -  [place] Not Applicable Not Applicable -  Some recent data might be hidden

## 2020-09-30 NOTE — Progress Notes (Signed)
    [facility] has been requested to perform a transesophageal echocardiogram on Steve [name] for bactermia.  After careful review of history and examination, the risks and benefits of transesophageal echocardiogram have been explained including risks of esophageal damage, perforation (1:10,000 risk), bleeding, pharyngeal hematoma as well as other potential complications associated with conscious sedation including aspiration, arrhythmia, respiratory failure and death. Alternatives to treatment were discussed, questions were answered. Patient is willing to proceed. Pt with trach and on pressure support.  Has PEG as well.    Cecilie Kicks, NP  09/30/2020 7:58 AM

## 2020-09-30 NOTE — Progress Notes (Signed)
Placed PT on 60% ATC at approximately 1120- tolerating at this time (Vitals on Flowsheet). RN aware.

## 2020-09-30 NOTE — Progress Notes (Signed)
Placed PT back on vent due to increased work of breathing. RN aware.

## 2020-09-30 NOTE — Progress Notes (Signed)
Nutrition Follow-up  DOCUMENTATION CODES:   Severe malnutrition in context of chronic illness  INTERVENTION:  - continue Vital AF 1.2 @ 65 ml/hr with 100 ml free water QID. - will monitor PO intakes to determine if need to adjust TF regimen is warranted.  - weight patient today.    NUTRITION DIAGNOSIS:   Severe Malnutrition related to chronic illness, cancer and cancer related treatments as evidenced by severe fat depletion, severe muscle depletion. -ongoing  GOAL:   Patient will meet greater than or equal to 90% of their needs -met  MONITOR:   TF tolerance, PO intake, Labs, Weight trends, Skin   ASSESSMENT:   65 year old Caucasian  male with medical history significant of chronic tracheostomy after prolonged hospitalization in 2021 related with pneumonia due to [id number] complicated by ARDS and pulmonary fibrosis +/-pneumonitis from brentuximab, s/p PEG, HTN, DM type II, Hodgkin's lymphoma on chemotherapy and GERD.  Patient was discharged from this hospital on 08/25/2020.  Patient was said to have done very well for the first 3 weeks.  Over the last 5 days, patient has been failing to thrive, with associated poor appetite and malaise.  Patient was extubated to trach collar earlier this AM. He is laying in bed, awake, with no family or visitors present. He has PEG in place and is receiving Vital AF 1.2 @ 65 ml/hr with 100 ml free water QID. This regimen is providing 1872 kcal, 117 grams protein, and 1665 ml free water.  RN has ordered lunch for patient. Discussed with patient rationale for changing TF formula from Osmolite 1.2 to Vital AF 1.2 and explained to him that RD does not feel that this change will need to remain outside of the hospital (that when he returns home he can resume Osmolite 1.2).  Weight on 6/18 was the same as weight on 6/11 and he has not been weighed again since 6/18. No information documented in the edema section of flow sheet.    Labs reviewed; 10000 units  cholecalciferol/day, 50 mg solu-cortef BID, sliding scale novolog, 4 units novolog every 4 hours, 20 units lantus/day, 15 ml multivitamin/day, 40 mg protonix/day, 40 mEq Klor-Con x1 dose 6/21. Medications reviewed; CBGs: 209, 110, 83, 242 mg/dl, Cl: 93 mmol/l, BUN: 30 mg/dl, creatinine: 0.55 mg/dl.    Diet Order:   Diet Order             Diet Carb Modified Fluid consistency: Thin; Room service appropriate? Yes  Diet effective now                   EDUCATION NEEDS:   Education needs have been addressed  Skin:  Skin Assessment: Skin Integrity Issues: Skin Integrity Issues:: Stage I Stage I: sacrum (newly documented 6/19)  Last BM:  6/21 (type 4 x1)  Height:   Ht Readings from Last 1 Encounters:  09/26/20 _0  (1.651 m)    Weight:   Wt Readings from Last 1 Encounters:  09/26/20 63.3 kg      Estimated Nutritional Needs:  Kcal:  1950-2150 Protein:  95-110g Fluid:  2L/day     Jarome Matin, MS, RD, LDN, CNSC Inpatient Clinical Dietitian RD pager # available in [facility]  After hours/weekend pager # available in [facility]

## 2020-09-30 NOTE — Progress Notes (Signed)
NAME:  Steve [name], MRN:  [id number], DOB:  1956-02-08, LOS: 85 ADMISSION DATE:  09/19/2020, CONSULTATION DATE:  09/24/20 REFERRING MD:  Dr Nolberto Hanlon, CHIEF COMPLAINT:  Resp failure   BRIEF   65 year old male who is well kwn to PCCM service from springs-summer 2021 for s/p covid, respiratoy failure, ARDS, pneumothorax, prolonged mechanical ventilation (? 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  At baseline.   Port-A-Cath placement in December 2020 and gastrostomy tube placement in May 2021.  Of note , has Hodgkins Lymphoma  with recuirrence March 2022 (Bulky intrathoracic adenopathy esp new substernal and newosseous metastatic disease, PET 06/19/2020.). As of April 2022 got 2nd dose Adcetris.  The admitted May 2022 for "pneumonia" and Acetris help (in part due to high LFT) and all Rx held.  Also as a preadmission problem: Chronic caxhexia, protein calorie malnutrition - moderate, failure thrive, DM all as his baseline prior to admission problems/ his baseline ECOG May 2022 was 2  Admitted 09/19/20 (recent baseline 8LO2 via trach). With worsening cough, lethargy.  Blood culture came back positive with Staphylococcus epidermis on 09/22/20 (all 4 of 4 cutuirs), infectious disease and s/p Port-A-Cath removal 09/22/20. As a present on admission problem O 09/22/20 - was needed 10L O2. CT scan this admit 09/20/2020 shwoed worrsening lymphadenopathy in chest concerning for Hodgkins progression. On 6/15 - Seen by Dr Marin Olp and recommended ICE protocol when better (highly immune suppressive regimen). ID had recommended 2 week Rx  . On 6/16 - reports he is more hypoxemic needeing >90% fio2 via Trach collar though no increased work of breathing. CCM consulted  Remains full code despite multiple conversations  Pertinent  Medical History  Covid admission March - May 2021   Significant Hospital  Events: Including procedures, antibiotic start and stop dates in addition to other pertinent events   6/16 - ccm consult  6/17 -CT angiogram ruled out pulmonary embolism.  He has progressive Hodgkin's disease on the CT scan of the chest.  He was unable to sustain facemask oxygen and has required being placed on pressure support ventilation through the tracheostomy.  He now has a cuffed 6 sized tracheostomy.  Currently on 50% oxygen and saturating well and more comfortable. 6/18 -stuck on pressure support ventilator at 40% FiO2.  Unable to wean off.  He is watching TV and is able to eat 6/22 slowly progressing PSV wean  Interim History / Subjective:  PSV to 12 on 6/21, tolerated. He denies resp distress, states that he has slightly increased secretions.    Objective   Blood pressure 133/76, pulse 89, temperature 98 F (36.7 C), temperature source Oral, resp. rate (!) 29, height 5\' 5"  (1.651 m), weight 63.3 kg, SpO2 90 %.    Vent Mode: PSV;CPAP FiO2 (%):  [40 %] 40 % PEEP:  [5 cmH20] 5 cmH20 Pressure Support:  [12 cmH20-16 cmH20] 12 cmH20 Plateau Pressure:  [22 cmH20] 22 cmH20   Intake/Output Summary (Last 24 hours) at 09/30/2020 0749 Last data filed at 09/30/2020 0700 Gross per 24 hour  Intake 3681.9 ml  Output 1450 ml  Net 2231.9 ml   Filed Weights   09/19/20 1930 09/26/20 0258  Weight: 63.3 kg 63.3 kg    Examination: Gen: Thin, chronically ill, no distress, comfortable respiratory pattern HEENT: Oropharynx clear, edentulous Neck: Trach site clean and dry Lungs: Scattered bilateral rhonchi, no wheeze, no crackles CV: Distant,  regular, no murmur Abd: Nondistended, positive bowel sounds Ext: Trace pretibial edema Neuro: Awake, alert, interacting, follows commands, well oriented.   Labs/imaging that I havepersonally reviewed  (right click and "Reselect all SmartList Selections" daily)   Labs and studies reviewed on 6/22  LABS    PULMONARY Recent Labs  Lab  09/24/20 1230  PHART 7.350  PCO2ART 84.3*  PO2ART 41.3*  HCO3 45.3*  O2SAT 75.5    CBC Recent Labs  Lab 09/28/20 0330 09/29/20 0256 09/29/20 0646 09/30/20 0248  HGB 8.0* 11.1* 10.3* 11.0*  HCT 25.7* 34.9* 32.4* 35.7*  WBC 17.8*  --  18.0* 17.8*  PLT 196  --  204 245    COAGULATION No results for input(s): INR in the last 168 hours.  CARDIAC  No results for input(s): TROPONINI in the last 168 hours. No results for input(s): PROBNP in the last 168 hours.   CHEMISTRY Recent Labs  Lab 09/24/20 0534 09/25/20 0258 09/26/20 0304 09/27/20 0334 09/28/20 0330 09/29/20 0646 09/30/20 0248  NA 141   < > 143 141 140 139 142  K 4.7   < > 4.2 4.2 4.2 3.3* 3.8  CL 95*   < > 93* 94* 95* 92* 93*  CO2 41*   < > 45* 42* 40* 41* 44*  GLUCOSE 391*   < > 191* 179* 208* 154* 103*  BUN 24*   < > 24* 29* 29* 32* 30*  CREATININE 0.55*   < > 0.49* 0.50* 0.59* 0.47* 0.55*  CALCIUM 9.7   < > 9.2 9.0 8.9 8.9 9.4  MG 2.1  --  2.1 2.0  --  1.9  --   PHOS 2.3*  --  2.7 3.4  --  3.2 2.7   < > = values in this interval not displayed.   Estimated Creatinine Clearance: 81.1 mL/min (A) (by C-G formula based on SCr of 0.55 mg/dL (L)).   LIVER Recent Labs  Lab 09/23/20 1026 09/24/20 0534 09/25/20 0258 09/26/20 0304 09/27/20 0334 09/29/20 0646 09/30/20 0248  AST 11* 12* 13* 13*  --   --   --   ALT 19 17 18 18   --   --   --   ALKPHOS 73 73 59 61  --   --   --   BILITOT 0.3 0.2* 0.4 0.3  --   --   --   PROT 6.3* 6.4* 5.5* 5.7*  --   --   --   ALBUMIN 2.1* 2.2* 2.0* 2.0* 1.9* 2.0* 2.0*     INFECTIOUS Recent Labs  Lab 09/28/20 0330  LATICACIDVEN 0.9      ENDOCRINE CBG (last 3)  Recent Labs    09/30/20 0000 09/30/20 0350 09/30/20 0743  GLUCAP 209* 110* 83      Resolved Hospital Problem list   x   Assessment & Plan:  Baseline:   -Chronic hypoxemic respiratory failure 8 L oxygen via tracheostomy -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 - ECOG 3 baseline   Currently:  Acute on chronic respiratory failure with hypoxemia and hypercapnia, baseline underlying issues as described above. -Admitted for sepsis due to MRSE bacteremia.  Port removed.  ID managing antimicrobials -Post admit > acute on chronic respriatory failure  with worsening hypoxemia, ultimately required MV - PE/DVT ruled out. Needing vent support PSV  - likely sepsis medidated demand on compromised lungs -Tolerated decrease PSV to 16 on 6/21 Plan -Wean pressure support further 6/22, will try PS 8.  If well-tolerated then transition to ATC later today.  Will discuss with RT -Hopefully we will be able to get to ATC which would potentially facilitate progress regarding his cancer therapy, chemotherapy.  MRSE bacteremia Plan -Vancomycin as ordered -Appreciate ID input -TEE recommended to help determine duration of therapy -Would defer PICC line placement until after TEE if this is going to be done -Agree with quickly weaning his stress dose hydrocortisone to off  Relapsed Hodgkin's lymphoma, progressive lymphadenopathy by CT chest 09/25/2020 with bulky mediastinal adenopathy, diffuse pleural interstitial and parenchymal abnormalities Plan -Patient wants to proceed with aggressive medical care if possible.  Appreciate Dr. Antonieta Pert assistance. -PICC line when feasible to do so based on improvement from his issues above.    Best practice (right click and "Reselect all SmartList Selections" daily)  Per triad   ATTESTATION & SIGNATURE   Independent CC time 52minutes  Baltazar Apo, MD, PhD 09/30/2020, 7:49 AM [facility] [phone] or if no answer  before 7:00PM call [phone] For any issues after 7:00PM please call eLink [phone]

## 2020-09-30 NOTE — Progress Notes (Signed)
[facility] [id number] [employer] [facility]) Hospital Liaison note:  This patient is currently enrolled in [facility] outpatient-based Palliative Care. Will continue to follow for disposition.  Please call with any outpatient palliative questions or concerns.  Thank you, Lorelee Market, LPN [facility] Liaison [phone]

## 2020-09-30 NOTE — Progress Notes (Signed)
PROGRESS NOTE    Steve [name]  LHT:[id number] DOB: December 13, 1955 DOA: 09/19/2020 PCP: [name], PA-C    Brief Narrative:  This 65 years old male with PMH significant for XBWIO-03 infection complicated by ARDS, pulmonary fibrosis / pneumonitis and chronic respiratory failure on 8 L via trach, PEG dependence, Hodgkin's lymphoma on chemo, type 2 diabetes, hypertension and GERD presented in the ED with generalized malaise, poor appetite and failure to thrive and was admitted for possible pneumonia and found to have a staph epidermidis bacteremia. 0n 09/24/20, patient has developed respiratory distress with increased oxygen requirement and was transferred to ICU and started on pressure support ventilation. Palliative medicine and infectious disease following.  ID recommended TEE to rule out infection and decide about need for PICC line for IV antibiotics.  Assessment & Plan:   Principal Problem:   Failure to thrive in adult Active Problems:   Pressure ulcer   Tracheostomy dependence ([name])   Chronic respiratory failure with hypoxia ([name])   Hodgkin's lymphoma ([place])   Essential hypertension   Bacteremia  Sepsis due to pneumonia /staph epidermis bacteremia: POA.  Patient presented with tachycardia, tachypnea, leukocytosis on presentation . CXR showed possible recurrent pneumonia but difficult to interpret.   CT chest showed interval increase in bulk of mediastinal and axillary adenopathy.   Cultures grew staph epidermidis.  Port a cath removed on 6/16. Patient remained on cefepime 6/11-6/15. ID recommends IV Vanco for minimum of 2 weeks from 6/14 (first negative cultures) if not able to rule out endocarditis. ID recommended TEE to exclude endocarditis, have discussed w/ cardiology, is scheduled for TEE 6/24, will need to be NPO the midnight 6/23 Continue midodrine for hypotension. He has been on stress dose steroids since 6/11 (hydrocortisone 50 q8).  Discussed w/ pccm, agrees with  now tapering. Will space out to 50 IV q12.    Hypotension likely arterial insufficiency from chronic steroid in the setting of sepsis. Continue  midodrine and stress dose steroid.   Acute on chronic hypoxic respiratory failure: Patient  has tracheostomy due to post-COVID pulmonary fibrosis with UIP pattern-on 8 L via trach at baseline.    CXR and CT chest as above. -On PS MV since 6/16.  PCCM managing.   Relapsed Hodgkin's lymphoma:  CT chest concerning for worsening LAD and progression. Oncology following. He will need central venous access at some point to resume chemotherapy.   DM-2 with hyperglycemia , Uncontrolled.:  Hyperglycemia likely due to steroid. Continue SSI to moderate every 4 hours. Increase NovoLog from 2 to 4 units every 4 hours Continue  lantus to 20 qd Further adjustment as appropriate   Normocytic anemia/anemia of chronic disease:  Continue to monitor. Hb stable.  Hyponatremia: Resolved.   Hypokalemia Resolved. Continue to moniter   Metabolic alkalosis:  Could be due to CO2 retention  He is on mechanical ventilation.    Leukocytosis/bandemia: Likely due to steroid versus sepsis. Continue to monitor   Severe protein calorie malnutrition: POA. Tolerating some by mouth, also receiving tube feeds while mechanically ventilated   Body mass index is 23.22 kg/m. Nutrition Problem: Severe Malnutrition Etiology: chronic illness, cancer and cancer related treatments Signs/Symptoms: severe fat depletion, severe muscle depletion Interventions: Tube feeding, Prostat, MVI   DVT prophylaxis: Lovenox. Code Status: Full code. Family Communication: No family at bed side. Disposition Plan:    Status is: Inpatient  Remains inpatient appropriate because:Inpatient level of care appropriate due to severity of illness  Dispo: The patient is from: Home  Anticipated d/c is to: SNF              Patient currently is not medically stable to d/c.    Difficult to place patient No  Consultants:  ID PCCM Cardiology  Procedures:  TEE on 6/24. Antimicrobials:   Anti-infectives (From admission, onward)    Start     Dose/Rate Route Frequency Ordered Stop   09/23/20 0800  ceFEPIme (MAXIPIME) 2 g in sodium chloride 0.9 % 100 mL IVPB  Status:  Discontinued        2 g 200 mL/hr over 30 Minutes Intravenous Every 8 hours 09/23/20 0738 09/23/20 1758   09/21/20 0400  vancomycin (VANCOREADY) IVPB 750 mg/150 mL        750 mg 150 mL/hr over 60 Minutes Intravenous Every 12 hours 09/20/20 1715     09/20/20 1730  vancomycin (VANCOREADY) IVPB 750 mg/150 mL        750 mg 150 mL/hr over 60 Minutes Intravenous STAT 09/20/20 1714 09/20/20 1954   09/20/20 0400  vancomycin (VANCOREADY) IVPB 750 mg/150 mL  Status:  Discontinued        750 mg 150 mL/hr over 60 Minutes Intravenous Every 12 hours 09/19/20 1932 09/20/20 1155   09/20/20 0000  ceFEPIme (MAXIPIME) 2 g in sodium chloride 0.9 % 100 mL IVPB  Status:  Discontinued        2 g 200 mL/hr over 30 Minutes Intravenous Every 8 hours 09/19/20 1932 09/23/20 0721   09/19/20 1600  ceFEPIme (MAXIPIME) 2 g in sodium chloride 0.9 % 100 mL IVPB        2 g 200 mL/hr over 30 Minutes Intravenous  Once 09/19/20 1548 09/19/20 1634   09/19/20 1600  vancomycin (VANCOREADY) IVPB 1250 mg/250 mL        1,250 mg 166.7 mL/hr over 90 Minutes Intravenous STAT 09/19/20 1557 09/19/20 1822        Subjective: Patient was seen and examined at bedside.  He reports feeling better,  denies any chest pain.   He is on trach collar and has a PEG tube. He is getting feeding through the PEG tube, He is a scheduled to have a TEE on 6/24  Objective: Vitals:   09/30/20 1000 09/30/20 1100 09/30/20 1131 09/30/20 1200  BP: 108/77 128/72  125/76  Pulse: 94 99 (!) 101 96  Resp: (!) 33 (!) 29 (!) 30 16  Temp:    98.2 F (36.8 C)  TempSrc:    Oral  SpO2: 100% 90% 93% 96%  Weight:      Height:        Intake/Output Summary (Last 24  hours) at 09/30/2020 1341 Last data filed at 09/30/2020 1238 Gross per 24 hour  Intake 3996.9 ml  Output 1725 ml  Net 2271.9 ml   Filed Weights   09/19/20 1930 09/26/20 0258  Weight: 63.3 kg 63.3 kg    Examination:  General exam: Appears calm and comfortable, not in any acute distress.  Has a trach collar. Respiratory system: Clear to auscultation. Respiratory effort normal. Cardiovascular system: S1 & S2 heard, RRR. No JVD, murmurs, rubs, gallops or clicks. No pedal edema. Gastrointestinal system: Abdomen is nondistended, soft and nontender. No organomegaly or masses felt. Normal bowel sounds heard.  PEG tube noted no signs of erythema or redness noted. Central nervous system: Alert and oriented. No focal neurological deficits. Extremities: Symmetric 5 x 5 power. Skin: No rashes, lesions or ulcers Psychiatry: Judgement and insight appear normal. Mood & affect  appropriate.     Data Reviewed: I have personally reviewed following labs and imaging studies  CBC: Recent Labs  Lab 09/24/20 0534 09/25/20 0258 09/26/20 0304 09/27/20 0334 09/28/20 0330 09/29/20 0256 09/29/20 0646 09/30/20 0248  WBC 20.4* 16.9* 17.8* 17.0* 17.8*  --  18.0* 17.8*  NEUTROABS 16.4* 13.8* 15.4*  --   --   --  14.2*  --   HGB 10.0* 8.7* 8.9* 8.4* 8.0* 11.1* 10.3* 11.0*  HCT 35.3* 29.9* 29.8* 27.7* 25.7* 34.9* 32.4* 35.7*  MCV 102.0* 99.0 96.1 94.5 93.8  --  91.5 94.7  PLT 218 202 214 211 196  --  204 400   Basic Metabolic Panel: Recent Labs  Lab 09/24/20 0534 09/25/20 0258 09/26/20 0304 09/27/20 0334 09/28/20 0330 09/29/20 0646 09/30/20 0248  NA 141   < > 143 141 140 139 142  K 4.7   < > 4.2 4.2 4.2 3.3* 3.8  CL 95*   < > 93* 94* 95* 92* 93*  CO2 41*   < > 45* 42* 40* 41* 44*  GLUCOSE 391*   < > 191* 179* 208* 154* 103*  BUN 24*   < > 24* 29* 29* 32* 30*  CREATININE 0.55*   < > 0.49* 0.50* 0.59* 0.47* 0.55*  CALCIUM 9.7   < > 9.2 9.0 8.9 8.9 9.4  MG 2.1  --  2.1 2.0  --  1.9  --   PHOS  2.3*  --  2.7 3.4  --  3.2 2.7   < > = values in this interval not displayed.   GFR: Estimated Creatinine Clearance: 81.1 mL/min (A) (by C-G formula based on SCr of 0.55 mg/dL (L)). Liver Function Tests: Recent Labs  Lab 09/24/20 0534 09/25/20 0258 09/26/20 0304 09/27/20 0334 09/29/20 0646 09/30/20 0248  AST 12* 13* 13*  --   --   --   ALT _0 --   --   --   ALKPHOS 73 59 61  --   --   --   BILITOT 0.2* 0.4 0.3  --   --   --   PROT 6.4* 5.5* 5.7*  --   --   --   ALBUMIN 2.2* 2.0* 2.0* 1.9* 2.0* 2.0*   No results for input(s): LIPASE, AMYLASE in the last 168 hours. No results for input(s): AMMONIA in the last 168 hours. Coagulation Profile: No results for input(s): INR, PROTIME in the last 168 hours. Cardiac Enzymes: No results for input(s): CKTOTAL, CKMB, CKMBINDEX, TROPONINI in the last 168 hours. BNP (last 3 results) No results for input(s): PROBNP in the last 8760 hours. HbA1C: No results for input(s): HGBA1C in the last 72 hours. CBG: Recent Labs  Lab 09/29/20 2051 09/30/20 0000 09/30/20 0350 09/30/20 0743 09/30/20 1128  GLUCAP 244* 209* 110* 83 242*   Lipid Profile: No results for input(s): CHOL, HDL, LDLCALC, TRIG, CHOLHDL, LDLDIRECT in the last 72 hours. Thyroid Function Tests: No results for input(s): TSH, T4TOTAL, FREET4, T3FREE, THYROIDAB in the last 72 hours. Anemia Panel: No results for input(s): VITAMINB12, FOLATE, FERRITIN, TIBC, IRON, RETICCTPCT in the last 72 hours. Sepsis Labs: Recent Labs  Lab 09/28/20 0330  LATICACIDVEN 0.9    Recent Results (from the past 240 hour(s))  Culture, blood (Routine X 2) w Reflex to ID Panel     Status: None   Collection Time: 09/22/20  7:06 PM   Specimen: BLOOD  Result Value Ref Range Status   Specimen Description  Final    BLOOD RIGHT ANTECUBITAL Performed at [facility] [address] [place], [place] [address]    Special Requests   Final    BOTTLES DRAWN AEROBIC AND  ANAEROBIC Blood Culture adequate volume Performed at [facility] [address], [place], [place] [address]    Culture   Final    NO GROWTH 5 DAYS Performed at [facility], [place] [address], [place], [place] [address]    Report Status 09/27/2020 FINAL  Final  Culture, blood (Routine X 2) w Reflex to ID Panel     Status: None   Collection Time: 09/22/20  7:06 PM   Specimen: BLOOD RIGHT HAND  Result Value Ref Range Status   Specimen Description   Final    BLOOD RIGHT HAND Performed at [facility] [address] [place], [place] [address]    Special Requests   Final    BOTTLES DRAWN AEROBIC ONLY Blood Culture adequate volume Performed at [facility] [address] [place], [place] [address]    Culture   Final    NO GROWTH 5 DAYS Performed at [facility], [place] [address], [place], [place] [address]    Report Status 09/27/2020 FINAL  Final  MRSA PCR Screening     Status: None   Collection Time: 09/24/20 10:47 AM  Result Value Ref Range Status   MRSA by PCR NEGATIVE NEGATIVE Final    Comment:        The GeneXpert MRSA Assay (FDA approved for NASAL specimens only), is one component of a comprehensive MRSA colonization surveillance program. It is not intended to diagnose MRSA infection nor to guide or monitor treatment for MRSA infections. Performed at [facility], [place] [address], [place], [place] [address]     Radiology Studies: No results found.  Scheduled Meds:  chlorhexidine gluconate (MEDLINE KIT)  15 mL Mouth Rinse BID   Chlorhexidine Gluconate Cloth  6 each Topical Q0600   cholecalciferol  10,000 Units Oral Daily   enoxaparin (LOVENOX) injection  40 mg Subcutaneous Q24H   free water  100 mL Per Tube Q6H   hydrocortisone sod succinate (SOLU-CORTEF) inj  50 mg Intravenous Q12H   insulin aspart  0-15 Units Subcutaneous Q4H   insulin aspart  4 Units Subcutaneous Q4H    insulin glargine  20 Units Subcutaneous Daily   mouth rinse  15 mL Mouth Rinse 10 times per day   midodrine  5 mg Per Tube TID WC   multivitamin  15 mL Per Tube Daily   pantoprazole sodium  40 mg Per Tube Daily   sertraline  50 mg Per Tube Daily   traZODone  100 mg Per Tube QHS   Continuous Infusions:  feeding supplement (VITAL AF 1.2 CAL) 1,000 mL (09/30/20 0729)   vancomycin Stopped (09/30/20 0602)     LOS: 11 days    Time spent: 35 mins    Kirra Verga, MD [facility]   If 7PM-7AM, please contact night-coverage

## 2020-10-01 DIAGNOSIS — R627 Adult failure to thrive: Secondary | ICD-10-CM | POA: Diagnosis not present

## 2020-10-01 LAB — RENAL FUNCTION PANEL
Albumin: 2 g/dL — ABNORMAL LOW (ref 3.5–5.0)
Anion gap: 7 (ref 5–15)
BUN: 26 mg/dL — ABNORMAL HIGH (ref 8–23)
CO2: 42 mmol/L — ABNORMAL HIGH (ref 22–32)
Calcium: 9.4 mg/dL (ref 8.9–10.3)
Chloride: 93 mmol/L — ABNORMAL LOW (ref 98–111)
Creatinine, Ser: 0.49 mg/dL — ABNORMAL LOW (ref 0.61–1.24)
GFR, Estimated: >60 mL/min (ref >60)
Glucose, Bld: 100 mg/dL — ABNORMAL HIGH (ref 70–99)
Phosphorus: 3.6 mg/dL (ref 2.5–4.6)
Potassium: 4 mmol/L (ref 3.5–5.1)
Sodium: 142 mmol/L (ref 135–145)

## 2020-10-01 LAB — CBC
HCT: 35.8 % — ABNORMAL LOW (ref 39.0–52.0)
Hemoglobin: 11 g/dL — ABNORMAL LOW (ref 13.0–17.0)
MCH: 29.7 pg (ref 26.0–34.0)
MCHC: 30.7 g/dL (ref 30.0–36.0)
MCV: 96.8 fL (ref 80.0–100.0)
Platelets: 216 10*3/uL (ref 150–400)
RBC: 3.7 MIL/uL — ABNORMAL LOW (ref 4.22–5.81)
RDW: 17.8 % — ABNORMAL HIGH (ref 11.5–15.5)
WBC: 15.9 10*3/uL — ABNORMAL HIGH (ref 4.0–10.5)
nRBC: 0 % (ref 0.0–0.2)

## 2020-10-01 LAB — GLUCOSE, CAPILLARY
Glucose-Capillary: 100 mg/dL — ABNORMAL HIGH (ref 70–99)
Glucose-Capillary: 145 mg/dL — ABNORMAL HIGH (ref 70–99)
Glucose-Capillary: 192 mg/dL — ABNORMAL HIGH (ref 70–99)
Glucose-Capillary: 194 mg/dL — ABNORMAL HIGH (ref 70–99)
Glucose-Capillary: 201 mg/dL — ABNORMAL HIGH (ref 70–99)
Glucose-Capillary: 81 mg/dL (ref 70–99)

## 2020-10-01 LAB — MAGNESIUM: Magnesium: 2.1 mg/dL (ref 1.7–2.4)

## 2020-10-01 NOTE — Evaluation (Signed)
Physical Therapy Evaluation Patient Details Name: BON DOWIS MRN: [id number] DOB: 1955-10-18 Today's Date: 10/01/2020   History of Present Illness  Patient  is a 65 year old male who was admitted for possible pneumonia and found to have a staph epidermidis bacteremia. 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  0n 09/24/20, developed respiratory distress with increased oxygen requirement and was transferred to ICU and started on pressure support ventilation. currently on trach collar on 10L fiO2 at 60% with intermittent vent support.  Clinical Impression  Patient sitting in recliner, waiting for Lunch. Assisted patient to stand from recliner x 1 for ~ 30 secs. Noted increased SOB and SPO2 dropped to 87%. Patient required   some time to recover.  Patient reports that he does ambulate 20' 2 times a week with RW and WC behind.  Pt admitted with above diagnosis.  Pt currently with functional limitations due to the deficits listed below (see PT Problem List). Pt will benefit from skilled PT to increase their independence and safety with mobility to allow discharge to the venue listed below.     p  Follow Up Recommendations No PT follow up    Equipment Recommendations  None recommended by PT    Recommendations for Other Services       Precautions / Restrictions Precautions Precautions: Fall Precaution Comments: trach, vent use, as neededPEG tube,      Mobility  Bed Mobility               General bed mobility comments: in recliner    Transfers Overall transfer level: Needs assistance Equipment used: Rolling walker (2 wheeled) Transfers: Sit to/from Stand Sit to Stand: Min assist         General  transfer comment: able to power self to stnad with close min assistance. Stood for ~ 30 seconds, incresed RR  Ambulation/Gait             General Gait Details: TBA  Stairs            Wheelchair Mobility    Modified Rankin (Stroke Patients Only)       Balance Overall balance assessment: Needs assistance Sitting-balance support: No upper extremity supported;Feet supported Sitting balance-Leahy Scale: Fair     Standing balance support: Bilateral upper extremity supported Standing balance-Leahy Scale: Poor Standing balance comment: strongly supports on Rw                             Pertinent Vitals/Pain Pain Assessment: No/denies pain    Home Living Family/patient expects to be discharged to:: Private residence Living Arrangements: Spouse/significant other;Other relatives Available Help at Discharge: Family;Available 24 hours/day Type of Home: Mobile home Home Access: Ramped entrance     Home Layout: One level Home Equipment: Hospital bed;Wheelchair - [employer] - 2 wheels Additional Comments: brother in law provides care during the day    Prior Function Level of Independence: Needs assistance      ADL's / Homemaking Assistance Needed: patient reported having wife assistance with ADLs. patient would assist with UB ADLs and wife complete LB  tasks.  Comments: patient reports  that he ambulates 20'  x 2 twice a week, WC follows.  Hand Dominance   Dominant Hand: Right    Extremity/Trunk Assessment   Upper Extremity Assessment Upper Extremity Assessment: Defer to OT evaluation    Lower Extremity Assessment Lower Extremity Assessment: RLE deficits/detail;LLE deficits/detail RLE Deficits / Details: foot drop , ROM to neutral dorsiflexion, grossly 4 strength to stnad LLE Deficits / Details: Lacks ROM in dorsiflexion more than right, foot drop, grossly 4 strength  to stand    Cervical / Trunk Assessment Cervical / Trunk Assessment:  Normal  Communication   Communication: Tracheostomy (mouths words)  Cognition Arousal/Alertness: Awake/alert Behavior During Therapy: WFL for tasks assessed/performed Overall Cognitive Status: Within Functional Limits for tasks assessed                                        General Comments      Exercises     Assessment/Plan    PT Assessment Patient needs continued PT services  PT Problem List Decreased strength;Decreased mobility;Decreased range of motion;Decreased knowledge of precautions;Decreased activity tolerance;Cardiopulmonary status limiting activity       PT Treatment Interventions DME instruction;Therapeutic activities;Gait training;Therapeutic exercise;Patient/family education;Functional mobility training    PT Goals (Current goals can be found in the Care Plan section)  Acute Rehab PT Goals Patient Stated Goal: walk again PT Goal Formulation: With patient Time For Goal Achievement: 10/15/20 Potential to Achieve Goals: Good    Frequency Min 3X/week   Barriers to discharge        Co-evaluation               AM-PAC PT "6 Clicks" Mobility  Outcome Measure Help needed turning from your back to your side while in a flat bed without using bedrails?: A Little Help needed moving from lying on your back to sitting on the side of a flat bed without using bedrails?: A Little Help needed moving to and from a bed to a chair (including a wheelchair)?: A Little Help needed standing up from a chair using your arms (e.g., wheelchair or bedside chair)?: A Lot Help needed to walk in hospital room?: Total Help needed climbing 3-5 steps with a railing? : Total 6 Click Score: 13    End of Session Equipment Utilized During Treatment: Gait belt Activity Tolerance: Treatment limited secondary to medical complications (Comment) (incr. SOB, SPO2 dropped to 87% on TC) Patient left: in chair;with call bell/phone within reach Nurse Communication: Mobility  status PT Visit Diagnosis: Unsteadiness on feet (R26.81);Muscle weakness (generalized) (M62.81)    Time: 1350-1405 PT Time Calculation (min) (ACUTE ONLY): 15 min   Charges:   PT Evaluation $PT Eval Low Complexity: [facility] Acute Rehabilitation Services Pager [phone] Office [phone]   Claretha Cooper 10/01/2020, 4:23 PM

## 2020-10-01 NOTE — Progress Notes (Signed)
Patient reports feeling "full" after eating dinner, sitting in bed, slight increase WOB, notified RT, plan to place pt back on PS/vent. Tube feeding placed on hold at this time, Levsin given for pt reported bladder spasms. Notified MD.

## 2020-10-01 NOTE — Progress Notes (Signed)
PCCM Brief Note  Steve [name] has weaned to PSV 8 / PEEP 5. He is tachypneic at times, especially after suctioning. He did 2-3 hours of ATC yesterday. Will try to continue PS8 as maintenance, extend the time on ATC daily. Goal is to get to ATC ad lib.   Will continue to follow him.   Baltazar Apo, MD, PhD 10/01/2020, 8:19 AM [facility] [phone] or if no answer before 7:00PM call [phone] For any issues after 7:00PM please call eLink [phone]

## 2020-10-01 NOTE — Progress Notes (Addendum)
Placed PT on 60% ATC and deflated trach cuff at 0910- tolerating well at this time. RN aware.

## 2020-10-01 NOTE — Progress Notes (Signed)
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 0n 09/24/20, patient has developed respiratory distress with increased oxygen requirement and was transferred to ICU and started on pressure support ventilation.Palliative medicine and infectious disease following.  ID recommended TEE to rule out infective endocarditis and decide about need for PICC line for IV antibiotics.  Cardiology thinks the patient is not stable to have TEE at this time.  Assessment & Plan:   Principal Problem:   Failure to thrive in adult Active Problems:   Pressure ulcer   Tracheostomy dependence ([name])   Chronic respiratory failure with hypoxia ([name])   Hodgkin's lymphoma ([place])   Essential hypertension   Bacteremia  Sepsis due to pneumonia /staph epidermis bacteremia: POA.  Patient presented with tachycardia, tachypnea, leukocytosis on presentation . CXR showed possible recurrent pneumonia but difficult to interpret.   CT chest showed interval increase in bulk of mediastinal and axillary adenopathy.   Cultures grew staph epidermidis.  Port a cath removed on 6/16. Patient remained on cefepime 6/11-6/15. ID recommends IV Vanco for minimum of 2 weeks from 6/14 (first negative cultures) if not able to rule out endocarditis. ID recommended TEE to exclude endocarditis, It was  discussed w/ cardiology, He was scheduled for TEE 6/24,  but later cardiology thinks patient is not stable to have TEE.  Consider TEE when more  stable. Continue midodrine for hypotension. He has been on stress dose steroids since 6/11 (hydrocortisone 50 q8).  Discussed w/ pccm, agrees with now tapering. Will space out to 50 IV q12.    Hypotension likely arterial insufficiency from chronic steroid in the setting of sepsis. Continue midodrine and stress dose steroids.   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    CXR and CT chest as above. -On PS MV since 6/16.  PCCM managing.   Relapsed Hodgkin's lymphoma:  CT chest concerning for worsening LAD and progression. Oncology following. He will need central venous access at some point to resume chemotherapy.   DM-2 with hyperglycemia , Uncontrolled.:  Hyperglycemia likely due to steroid. Continue SSI to moderate every 4 hours. Increase NovoLog from 2 to 4 units every 4 hours Continue  lantus to 20 qd Further adjustment as appropriate   Normocytic anemia/anemia of chronic disease:  Continue to monitor. Hb stable.  Hyponatremia: Resolved.   Hypokalemia Resolved. Continue to moniter   Metabolic alkalosis:  Could be due to CO2 retention  He is on mechanical ventilation.    Leukocytosis/bandemia: Likely due to steroid versus sepsis. Continue to monitor   Severe protein calorie malnutrition: POA. Tolerating some by mouth, also receiving tube feeds while mechanically ventilated   Body mass index is 23.22 kg/m. Nutrition Problem: Severe Malnutrition Etiology: chronic illness, cancer and cancer related treatments Signs/Symptoms: severe fat depletion, severe muscle depletion Interventions: Tube feeding, Prostat, MVI   DVT prophylaxis: Lovenox. Code Status: Full code. Family Communication: No family at bed side. Disposition Plan:  Status is: Inpatient  Remains inpatient appropriate because:Inpatient level of care appropriate due to severity of  illness  Dispo: The patient is from: Home              Anticipated d/c is to: SNF              Patient currently is not medically stable to d/c.   Difficult to place patient No  Consultants:  ID PCCM Cardiology  Procedures:  TEE on 6/24. Antimicrobials:   Anti-infectives (From admission, onward)    Start     Dose/Rate Route Frequency Ordered Stop   09/23/20 0800  ceFEPIme (MAXIPIME) 2 g in sodium chloride 0.9 % 100 mL IVPB  Status:  Discontinued        2 g 200 mL/hr over 30 Minutes Intravenous Every 8 hours 09/23/20 0738 09/23/20 1758   09/21/20 0400  vancomycin (VANCOREADY) IVPB 750 mg/150 mL        750 mg 150 mL/hr over 60 Minutes Intravenous Every 12 hours 09/20/20 1715     09/20/20 1730  vancomycin (VANCOREADY) IVPB 750 mg/150 mL        750 mg 150 mL/hr over 60 Minutes Intravenous STAT 09/20/20 1714 09/20/20 1954   09/20/20 0400  vancomycin (VANCOREADY) IVPB 750 mg/150 mL  Status:  Discontinued        750 mg 150 mL/hr over 60 Minutes Intravenous Every 12 hours 09/19/20 1932 09/20/20 1155   09/20/20 0000  ceFEPIme (MAXIPIME) 2 g in sodium chloride 0.9 % 100 mL IVPB  Status:  Discontinued        2 g 200 mL/hr over 30 Minutes Intravenous Every 8 hours 09/19/20 1932 09/23/20 0721   09/19/20 1600  ceFEPIme (MAXIPIME) 2 g in sodium chloride 0.9 % 100 mL IVPB        2 g 200 mL/hr over 30 Minutes Intravenous  Once 09/19/20 1548 09/19/20 1634   09/19/20 1600  vancomycin (VANCOREADY) IVPB 1250 mg/250 mL        1,250 mg 166.7 mL/hr over 90 Minutes Intravenous STAT 09/19/20 1557 09/19/20 1822        Subjective: Patient was seen and examined at bedside.  He reports feeling better,  denies any chest pain.   He is on trach collar and has a PEG tube. He is getting feeding through the PEG tube, He was briefly placed on ventilator due to worsening difficulty breathing last night.  Cardiology thinks patient is not stable for TEE at this time.  Objective: Vitals:   10/01/20 1000  10/01/20 1100 10/01/20 1129 10/01/20 1200  BP: 122/61 (!) 106/54 (!) 106/54   Pulse: 94 90 91   Resp: 13 (!) 31 (!) 32   Temp:    97.8 F (36.6 C)  TempSrc:    Oral  SpO2: 96% 97% 98%   Weight:      Height:        Intake/Output Summary (Last 24 hours) at 10/01/2020 1334 Last data filed at 10/01/2020 1211 Gross per 24 hour  Intake 2143.12 ml  Output 1350 ml  Net 793.12 ml   Filed Weights   09/19/20 1930 09/26/20 0258 09/30/20 1646  Weight: 63.3 kg 63.3 kg 69 kg    Examination:  General exam: Appears calm and comfortable, not in any acute distress.  Has a trach collar. Respiratory system: Clear to auscultation. Respiratory effort normal. Cardiovascular system: S1 & S2 heard, RRR. No JVD, murmurs, rubs, gallops or clicks. No pedal edema. Gastrointestinal system: Abdomen  is nondistended, soft and nontender. No organomegaly or masses felt. Normal bowel sounds heard.  PEG tube noted no signs of erythema or redness noted. Central nervous system: Alert and oriented. No focal neurological deficits. Extremities: Symmetric 5 x 5 power. Skin: No rashes, lesions or ulcers Psychiatry: Judgement and insight appear normal. Mood & affect appropriate.     Data Reviewed: I have personally reviewed following labs and imaging studies  CBC: Recent Labs  Lab 09/25/20 0258 09/26/20 0304 09/27/20 0334 09/28/20 0330 09/29/20 0256 09/29/20 0646 09/30/20 0248 10/01/20 0749  WBC 16.9* 17.8* 17.0* 17.8*  --  18.0* 17.8* 15.9*  NEUTROABS 13.8* 15.4*  --   --   --  14.2*  --   --   HGB 8.7* 8.9* 8.4* 8.0* 11.1* 10.3* 11.0* 11.0*  HCT 29.9* 29.8* 27.7* 25.7* 34.9* 32.4* 35.7* 35.8*  MCV 99.0 96.1 94.5 93.8  --  91.5 94.7 96.8  PLT 202 214 211 196  --  204 245 641   Basic Metabolic Panel: Recent Labs  Lab 09/26/20 0304 09/27/20 0334 09/28/20 0330 09/29/20 0646 09/30/20 0248 10/01/20 0749  NA 143 141 140 139 142 142  K 4.2 4.2 4.2 3.3* 3.8 4.0  CL 93* 94* 95* 92* 93* 93*  CO2 45* 42*  40* 41* 44* 42*  GLUCOSE 191* 179* 208* 154* 103* 100*  BUN 24* 29* 29* 32* 30* 26*  CREATININE 0.49* 0.50* 0.59* 0.47* 0.55* 0.49*  CALCIUM 9.2 9.0 8.9 8.9 9.4 9.4  MG 2.1 2.0  --  1.9  --  2.1  PHOS 2.7 3.4  --  3.2 2.7 3.6   GFR: Estimated Creatinine Clearance: 81.1 mL/min (A) (by C-G formula based on SCr of 0.49 mg/dL (L)). Liver Function Tests: Recent Labs  Lab 09/25/20 0258 09/26/20 0304 09/27/20 0334 09/29/20 0646 09/30/20 0248 10/01/20 0749  AST 13* 13*  --   --   --   --   ALT 18 18  --   --   --   --   ALKPHOS 59 61  --   --   --   --   BILITOT 0.4 0.3  --   --   --   --   PROT 5.5* 5.7*  --   --   --   --   ALBUMIN 2.0* 2.0* 1.9* 2.0* 2.0* 2.0*   No results for input(s): LIPASE, AMYLASE in the last 168 hours. No results for input(s): AMMONIA in the last 168 hours. Coagulation Profile: No results for input(s): INR, PROTIME in the last 168 hours. Cardiac Enzymes: No results for input(s): CKTOTAL, CKMB, CKMBINDEX, TROPONINI in the last 168 hours. BNP (last 3 results) No results for input(s): PROBNP in the last 8760 hours. HbA1C: No results for input(s): HGBA1C in the last 72 hours. CBG: Recent Labs  Lab 09/30/20 1937 09/30/20 2309 10/01/20 0424 10/01/20 0737 10/01/20 1140  GLUCAP 103* 152* 145* 100* 192*   Lipid Profile: No results for input(s): CHOL, HDL, LDLCALC, TRIG, CHOLHDL, LDLDIRECT in the last 72 hours. Thyroid Function Tests: No results for input(s): TSH, T4TOTAL, FREET4, T3FREE, THYROIDAB in the last 72 hours. Anemia Panel: No results for input(s): VITAMINB12, FOLATE, FERRITIN, TIBC, IRON, RETICCTPCT in the last 72 hours. Sepsis Labs: Recent Labs  Lab 09/28/20 0330  LATICACIDVEN 0.9    Recent Results (from the past 240 hour(s))  Culture, blood (Routine X 2) w Reflex to ID Panel     Status: None   Collection Time: 09/22/20  7:06  PM   Specimen: BLOOD  Result Value Ref Range Status   Specimen Description   Final    BLOOD RIGHT  ANTECUBITAL Performed at [facility] [address] [place], [place] [address]    Special Requests   Final    BOTTLES DRAWN AEROBIC AND ANAEROBIC Blood Culture adequate volume Performed at [facility] [address], [place], [place] [address]    Culture   Final    NO GROWTH 5 DAYS Performed at [facility], [place] [address], [place], [place] [address]    Report Status 09/27/2020 FINAL  Final  Culture, blood (Routine X 2) w Reflex to ID Panel     Status: None   Collection Time: 09/22/20  7:06 PM   Specimen: BLOOD RIGHT HAND  Result Value Ref Range Status   Specimen Description   Final    BLOOD RIGHT HAND Performed at [facility] [address], [place], [place] [address]    Special Requests   Final    BOTTLES DRAWN AEROBIC ONLY Blood Culture adequate volume Performed at [facility] [address], [place], [place] [address]    Culture   Final    NO GROWTH 5 DAYS Performed at [facility], [place] [address], [place], [place] [address]    Report Status 09/27/2020 FINAL  Final  MRSA PCR Screening     Status: None   Collection Time: 09/24/20 10:47 AM  Result Value Ref Range Status   MRSA by PCR NEGATIVE NEGATIVE Final    Comment:        The GeneXpert MRSA Assay (FDA approved for NASAL specimens only), is one component of a comprehensive MRSA colonization surveillance program. It is not intended to diagnose MRSA infection nor to guide or monitor treatment for MRSA infections. Performed at [facility], [place] [address] [place], [place] [address]     Radiology Studies: No results found.  Scheduled Meds:  chlorhexidine gluconate (MEDLINE KIT)  15 mL Mouth Rinse BID   Chlorhexidine Gluconate Cloth  6 each Topical Q0600   cholecalciferol  10,000 Units Oral Daily   enoxaparin (LOVENOX) injection  40 mg Subcutaneous Q24H   free water  100 mL Per Tube Q6H    hydrocortisone sod succinate (SOLU-CORTEF) inj  50 mg Intravenous Q12H   insulin aspart  0-15 Units Subcutaneous Q4H   insulin aspart  4 Units Subcutaneous Q4H   insulin glargine  20 Units Subcutaneous Daily   mouth rinse  15 mL Mouth Rinse 10 times per day   midodrine  5 mg Per Tube TID WC   multivitamin  15 mL Per Tube Daily   pantoprazole sodium  40 mg Per Tube Daily   sertraline  50 mg Per Tube Daily   traZODone  100 mg Per Tube QHS   Continuous Infusions:  feeding supplement (VITAL AF 1.2 CAL) 65 mL/hr at 10/01/20 0800   vancomycin Stopped (10/01/20 0557)     LOS: 12 days    Time spent: 35 mins    Quoc Tome, MD [facility]   If 7PM-7AM, please contact night-coverage

## 2020-10-01 NOTE — Plan of Care (Signed)
  Problem: Education: Goal: Knowledge of General Education information will improve Description: Including pain rating scale, medication(s)/side effects and non-pharmacologic comfort measures Outcome: Progressing   Problem: Clinical Measurements: Goal: Respiratory complications will improve Outcome: Progressing   Problem: Nutrition: Goal: Adequate nutrition will be maintained Outcome: Progressing   

## 2020-10-01 NOTE — Evaluation (Signed)
Occupational Therapy Evaluation Patient Details Name: Steve [name] MRN: [id number] DOB: 1956/04/06 Today's Date: 10/01/2020    History of Present Illness Patient  is a 65 year old male who was admitted for possible pneumonia and found to have a staph epidermidis bacteremia. 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  0n 09/24/20, developed respiratory distress with increased oxygen requirement and was transferred to ICU and started on pressure support ventilation. currently on trach collar on 10L fiO2 at 60% with intermittent vent support.   Clinical Impression   Steve [name] is a 75 year man admitted to hospital with respiratory failure and failure to thrive. Patient at baseline lives at home needing assistance from family for ADLs with limited mobility and on trach collar on 8 L. Patient receiving HH PT and working on ambulation. Today patient presents with generalized weakness, decreased activity tolerance, impaired balance and decreased cardiopulmonary endurance resulting in a decline in baseline functional abilities. Patient requiring increased assistance with standing, transfers and ADLs. Patient will benefit from skilled OT services while in hospital to improve deficits and learn compensatory strategies as needed in order to improve functional abilities. Recommend HH OT at discharge.       Follow Up Recommendations  Home health OT    Equipment Recommendations  3 in 1 bedside commode    Recommendations for Other Services       Precautions / Restrictions Precautions Precautions: Fall Precaution Comments: trach, vent use,PEG tube, Restrictions Weight Bearing Restrictions: No      Mobility Bed  Mobility Overal bed mobility: Needs Assistance Bed Mobility: Supine to Sit     Supine to sit: Min assist;HOB elevated          Transfers Overall transfer level: Needs assistance Equipment used: 1 person hand held assist Transfers: Sit to/from [facility] Sit to Stand: Min assist;+2 safety/equipment Stand pivot transfers: Min assist;+2 safety/equipment            Balance Overall balance assessment: Needs assistance Sitting-balance support: No upper extremity supported;Feet supported Sitting balance-Leahy Scale: Fair     Standing balance support: Single extremity supported Standing balance-Leahy Scale: Poor                             ADL either performed or assessed with clinical judgement   ADL Overall ADL's : Needs assistance/impaired Eating/Feeding: Modified independent   Grooming: Set up;Sitting   Upper Body Bathing: Set up;Sitting   Lower Body Bathing: Moderate assistance;Bed level   Upper Body Dressing : Minimal assistance   Lower Body Dressing: Moderate assistance;Bed level   Toilet Transfer: Minimal assistance;+2 for safety/equipment;BSC;Stand-pivot   Toileting- Clothing Manipulation and Hygiene: Maximal assistance;Sit to/from stand       Functional mobility during ADLs: Minimal assistance       Vision Patient Visual Report: No change from baseline       Perception     Praxis      Pertinent Vitals/Pain Pain Assessment: No/denies pain     Hand Dominance Right   Extremity/Trunk Assessment Upper Extremity Assessment Upper Extremity Assessment: RUE deficits/detail;LUE deficits/detail RUE Deficits / Details: WFL ROM 5/5 strength RUE Sensation: WNL RUE Coordination: WNL LUE Deficits / Details: WFL ROM, 4-/5  strength LUE Sensation: WNL LUE Coordination: WNL   Lower Extremity Assessment Lower Extremity Assessment: Defer to PT evaluation   Cervical / Trunk Assessment Cervical / Trunk Assessment: Normal    Communication Communication Communication: Tracheostomy;Expressive difficulties   Cognition Arousal/Alertness: Awake/alert Behavior During Therapy: WFL for tasks assessed/performed Overall Cognitive Status: Within Functional Limits for tasks assessed                                     General Comments       Exercises     Shoulder Instructions      Home Living Family/patient expects to be discharged to:: Private residence Living Arrangements: Spouse/significant other;Other relatives Available Help at Discharge: Family;Available 24 hours/day Type of Home: Mobile home Home Access: Ramped entrance     Home Layout: One level     Bathroom Shower/Tub: Teacher, early years/pre: Standard Bathroom Accessibility: No   Home Equipment: Hospital bed;Wheelchair - [employer] - 2 wheels   Additional Comments: brother in law provides care during the day      Prior Functioning/Environment Level of Independence: Needs assistance  Gait / Transfers Assistance Needed: patient reported being able to take a few steps with PT ADL's / Homemaking Assistance Needed: patient reported having wife assistance with ADLs. patient would assist with UB ADLs and wife complete LB  tasks.            OT Problem List: Decreased activity tolerance;Impaired balance (sitting and/or standing);Decreased knowledge of use of DME or AE;Cardiopulmonary status limiting activity;Decreased strength      OT Treatment/Interventions: Self-care/ADL training;Therapeutic exercise;Energy conservation;Neuromuscular education;DME and/or AE instruction;Patient/family education;Balance training;Therapeutic activities    OT Goals(Current goals can be found in the care plan section) Acute Rehab OT Goals Patient Stated Goal: walk to the bathroom. OT Goal Formulation: With patient Time For Goal Achievement: 10/15/20 Potential to Achieve Goals: Good  OT Frequency: Min 2X/week   Barriers to D/C:             Co-evaluation              AM-PAC OT "6 Clicks" Daily Activity     Outcome Measure Help from another person eating meals?: None Help from another person taking care of personal grooming?: A Little Help from another person toileting, which includes using toliet, bedpan, or urinal?: A Lot Help from another person bathing (including washing, rinsing, drying)?: A Lot Help from another person to put on and taking off regular upper body clothing?: A Little Help from another person to put on and taking off regular lower body clothing?: A Lot 6 Click Score: 16   End of Session Equipment Utilized During Treatment: Oxygen Nurse Communication: Mobility status  Activity Tolerance: Patient tolerated treatment well Patient left: in chair;with call bell/phone within reach  OT Visit Diagnosis: Other abnormalities of gait and mobility (R26.89);Muscle weakness (generalized) (M62.81)                Time: 1025-8527 OT Time Calculation (min): 22 min Charges:  OT General Charges $OT Visit: 1 Visit OT Evaluation $OT Eval Moderate Complexity: 1 Mod  Jackelyn Poling OT, MS Acute Rehab Department Pager: [phone]   [place] 10/01/2020, 10:47 AM

## 2020-10-02 ENCOUNTER — Encounter (HOSPITAL_COMMUNITY)
Admission: EM | Disposition: A | Discharge status: Home / Self Care | Payer: Self-pay | Source: Home / Self Care | Attending: Internal Medicine

## 2020-10-02 ENCOUNTER — Encounter: Payer: Self-pay | Admitting: *Deleted

## 2020-10-02 LAB — RENAL FUNCTION PANEL
Albumin: 2.1 g/dL — ABNORMAL LOW (ref 3.5–5.0)
Anion gap: 6 (ref 5–15)
BUN: 26 mg/dL — ABNORMAL HIGH (ref 8–23)
CO2: 43 mmol/L — ABNORMAL HIGH (ref 22–32)
Calcium: 9.3 mg/dL (ref 8.9–10.3)
Chloride: 94 mmol/L — ABNORMAL LOW (ref 98–111)
Creatinine, Ser: 0.43 mg/dL — ABNORMAL LOW (ref 0.61–1.24)
GFR, Estimated: >60 mL/min (ref >60)
Glucose, Bld: 99 mg/dL (ref 70–99)
Phosphorus: 3.3 mg/dL (ref 2.5–4.6)
Potassium: 3.8 mmol/L (ref 3.5–5.1)
Sodium: 143 mmol/L (ref 135–145)

## 2020-10-02 LAB — CBC
HCT: 34.9 % — ABNORMAL LOW (ref 39.0–52.0)
Hemoglobin: 10.4 g/dL — ABNORMAL LOW (ref 13.0–17.0)
MCH: 29.1 pg (ref 26.0–34.0)
MCHC: 29.8 g/dL — ABNORMAL LOW (ref 30.0–36.0)
MCV: 97.8 fL (ref 80.0–100.0)
Platelets: 224 10*3/uL (ref 150–400)
RBC: 3.57 MIL/uL — ABNORMAL LOW (ref 4.22–5.81)
RDW: 17.2 % — ABNORMAL HIGH (ref 11.5–15.5)
WBC: 15.9 10*3/uL — ABNORMAL HIGH (ref 4.0–10.5)
nRBC: 0 % (ref 0.0–0.2)

## 2020-10-02 LAB — GLUCOSE, CAPILLARY
Glucose-Capillary: 114 mg/dL — ABNORMAL HIGH (ref 70–99)
Glucose-Capillary: 151 mg/dL — ABNORMAL HIGH (ref 70–99)
Glucose-Capillary: 155 mg/dL — ABNORMAL HIGH (ref 70–99)
Glucose-Capillary: 156 mg/dL — ABNORMAL HIGH (ref 70–99)
Glucose-Capillary: 159 mg/dL — ABNORMAL HIGH (ref 70–99)
Glucose-Capillary: 173 mg/dL — ABNORMAL HIGH (ref 70–99)
Glucose-Capillary: 270 mg/dL — ABNORMAL HIGH (ref 70–99)
Glucose-Capillary: 62 mg/dL — ABNORMAL LOW (ref 70–99)

## 2020-10-02 LAB — BASIC METABOLIC PANEL
Anion gap: 7 (ref 5–15)
BUN: 26 mg/dL — ABNORMAL HIGH (ref 8–23)
CO2: 43 mmol/L — ABNORMAL HIGH (ref 22–32)
Calcium: 9.3 mg/dL (ref 8.9–10.3)
Chloride: 93 mmol/L — ABNORMAL LOW (ref 98–111)
Creatinine, Ser: 0.52 mg/dL — ABNORMAL LOW (ref 0.61–1.24)
GFR, Estimated: >60 mL/min (ref >60)
Glucose, Bld: 98 mg/dL (ref 70–99)
Potassium: 3.7 mmol/L (ref 3.5–5.1)
Sodium: 143 mmol/L (ref 135–145)

## 2020-10-02 LAB — MAGNESIUM: Magnesium: 1.9 mg/dL (ref 1.7–2.4)

## 2020-10-02 LAB — PHOSPHORUS: Phosphorus: 3.3 mg/dL (ref 2.5–4.6)

## 2020-10-02 SURGERY — ECHOCARDIOGRAM, TRANSESOPHAGEAL
Anesthesia: Monitor Anesthesia Care

## 2020-10-02 MED ORDER — DEXTROSE 50 % IV SOLN
INTRAVENOUS | Status: AC
  Administered 2020-10-02: 50 mL
  Filled 2020-10-02: qty 50

## 2020-10-02 MED ORDER — DEXTROSE 50 % IV SOLN
INTRAVENOUS | Status: AC
  Filled 2020-10-02: qty 50

## 2020-10-02 MED ORDER — CHLORHEXIDINE GLUCONATE 0.12 % MT SOLN
OROMUCOSAL | Status: AC
  Filled 2020-10-02: qty 15

## 2020-10-02 NOTE — Progress Notes (Signed)
Patient was due to come into the office today for follow up. He continues to be admitted to the ICU at [facility]. Will follow patient for post discharge needs and follow up.  Oncology Nurse Navigator Documentation  Oncology Nurse Navigator Flowsheets 10/02/2020  Abnormal Finding Date -  Confirmed Diagnosis Date -  Diagnosis Status -  Planned Course of Treatment -  Phase of Treatment -  Chemotherapy Pending- Reason: -  Chemotherapy Actual Start Date: -  Navigator Follow Up Date: 10/09/2020  Navigator Follow Up Reason: Appointment Review  Navigator Location [facility]  Referral Date to RadOnc/MedOnc -  Navigator Encounter Type Appt/Treatment Plan Review  Telephone -  Treatment Initiated Date -  Patient Visit Type MedOnc  Treatment Phase Active Tx  Barriers/Navigation Needs Coordination of Care;Education;Employed;Family Concerns;Morbidities/Frailty;Multiple Hospital Admissions  Education -  Interventions None Required  Acuity Level 4-High Needs (Greater Than 4 Barriers Identified)  Referrals -  Coordination of Care -  Education Method -  Support Groups/Services Friends and Family  Time Spent with Patient 15

## 2020-10-02 NOTE — Progress Notes (Signed)
Pharmacy Antibiotic Note  BRICE KOSSMAN is a 65 y.o. male admitted on 09/19/2020 with possible pneumonia, found to have staph epidermidis bacteremia. Pharmacy has been consulted for Vancomycin dosing. 6/21 Last calculated AUC = 456 (goal 400-550) Today, 10/02/2020:  SCr remains low/stable at 0.4, WBC remains elevated on steroids (d/c on 6/24).  TEE delayed.    Plan: Continue Vancomycin 750mg  q12h. Continue to monitor renal function, recheck levels as needed OPAT abx through 6/28    Height: 5\' 5"  (165.1 cm) Weight: 69 kg (152 lb 1.9 oz) IBW/kg (Calculated) : 61.5  Temp (24hrs), Avg:97.8 F (36.6 C), Min:97.6 F (36.4 C), Max:98.5 F (36.9 C)  Recent Labs  Lab 09/28/20 0330 09/28/20 1915 09/29/20 0256 09/29/20 0646 09/30/20 0248 10/01/20 0749 10/02/20 0704  WBC 17.8*  --   --  18.0* 17.8* 15.9* 15.9*  CREATININE 0.59*  --   --  0.47* 0.55* 0.49* 0.43*  0.52*  LATICACIDVEN 0.9  --   --   --   --   --   --   VANCOTROUGH  --   --  12*  --   --   --   --   VANCOPEAK  --  24*  --   --   --   --   --      Estimated Creatinine Clearance: 81.1 mL/min (A) (by C-G formula based on SCr of 0.43 mg/dL (L)).    No Known Allergies  Antimicrobials this admission: 6/11 Vancomycin >> 6/12, resumed 6/12 >> 6/11 Cefepime >> 6/15  Dose adjustments this admission: 6/14 0536 VP: 37, 1530 VT: 10 6/20-6/21 levels Vancomycin peak = 24 Vancomycin trough = 12 Calculated AUC = 456 (goal 400-550) 6/21  Scr stable, peak drawn ~1 late and trough ~30 mins early but levels adjusted for calculation. Patient specific kinetics ke: 0.0902, T1/2 7.7 hours, Vd 36.4L  Microbiology results: 6/11 BCx: 4/4 bottles Staph epi (methicillin resistance detected)  6/11 UCx: multiple species - final Sputum:  ordered, not collected 6/11 Strep pneumo urinary antigen: neg 6/11 Legionella urinary antigen: neg 6/12 MRSA PCR: neg 6/14 BCx: NGTD  6/16 MRSA PCR: neg  Thank you for allowing pharmacy to be a  part of this patient's care.  Gretta Arab PharmD, BCPS Clinical Pharmacist [facility] main pharmacy [phone] 10/02/2020 11:29 AM

## 2020-10-02 NOTE — Progress Notes (Signed)
NAME:  Steve [name], MRN:  [id number], DOB:  [birth year], LOS: 31 ADMISSION DATE:  09/19/2020, CONSULTATION DATE:  09/24/20 REFERRING MD:  Dr Nolberto Hanlon, CHIEF COMPLAINT:  Resp failure   BRIEF   65 year old male who is well kwn to PCCM service from springs-summer 2021 for s/p covid, respiratoy failure, ARDS, pneumothorax, prolonged mechanical ventilation (? 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  At baseline.   Port-A-Cath placement in December 2020 and gastrostomy tube placement in May 2021.  Of note , has Hodgkins Lymphoma  with recuirrence March 2022 (Bulky intrathoracic adenopathy esp new substernal and newosseous metastatic disease, PET 06/19/2020.). As of April 2022 got 2nd dose Adcetris.  The admitted May 2022 for "pneumonia" and Acetris help (in part due to high LFT) and all Rx held.  Also as a preadmission problem: Chronic caxhexia, protein calorie malnutrition - moderate, failure thrive, DM all as his baseline prior to admission problems/ his baseline ECOG May 2022 was 2  Admitted 09/19/20 (recent baseline 8LO2 via trach). With worsening cough, lethargy.  Blood culture came back positive with Staphylococcus epidermis on 09/22/20 (all 4 of 4 cutuirs), infectious disease and s/p Port-A-Cath removal 09/22/20. As a present on admission problem O 09/22/20 - was needed 10L O2. CT scan this admit 09/20/2020 shwoed worrsening lymphadenopathy in chest concerning for Hodgkins progression. On 6/15 - Seen by Dr Marin Olp and recommended ICE protocol when better (highly immune suppressive regimen). ID had recommended 2 week Rx  . On 6/16 - reports he is more hypoxemic needeing >90% fio2 via Trach collar though no increased work of breathing. CCM consulted  Remains full code despite multiple conversations  Pertinent  Medical History  Covid admission March - May 2021   Significant Hospital  Events: Including procedures, antibiotic start and stop dates in addition to other pertinent events   6/16 - ccm consult  6/17 -CT angiogram ruled out pulmonary embolism.  He has progressive Hodgkin's disease on the CT scan of the chest.  He was unable to sustain facemask oxygen and has required being placed on pressure support ventilation through the tracheostomy.  He now has a cuffed 6 sized tracheostomy.  Currently on 50% oxygen and saturating well and more comfortable. 6/18 -stuck on pressure support ventilator at 40% FiO2.  Unable to wean off.  He is watching TV and is able to eat 6/22 slowly progressing PSV wean 6/24 tolerating ATM  Interim History / Subjective:  Tolerating ATM Slept well Denies any significant respiratory complaint at present  Objective   Blood pressure (!) 105/57, pulse 90, temperature 98.5 F (36.9 C), temperature source Oral, resp. rate (!) 31, height 5' 5"  (1.651 m), weight 69 kg, SpO2 99 %.    Vent Mode: PSV;CPAP FiO2 (%):  [45 %-60 %] 45 % PEEP:  [5 cmH20] 5 cmH20 Pressure Support:  [8 cmH20] 8 cmH20 Plateau Pressure:  [19 cmH20] 19 cmH20   Intake/Output Summary (Last 24 hours) at 10/02/2020 0831 Last data filed at 10/02/2020 0700 Gross per 24 hour  Intake 1918.37 ml  Output 1550 ml  Net 368.37 ml   Filed Weights   09/19/20 1930 09/26/20 0258 09/30/20 1646  Weight: 63.3 kg 63.3 kg 69 kg    Examination: Gen: Middle-aged, appears comfortable HEENT: Moist oral mucosa Neck: Trach site appears clean Lungs: Bilateral rhonchi, decreased at the bases  CV: S1-S2 appreciated Abd: Bowel sounds appreciated Ext:  Trace pretibial edema Neuro: Alert and interactive  Labs/imaging that I havepersonally reviewed  (right click and "Reselect all SmartList Selections" daily)   Lab data reviewed, bicarb of 43 White count 15.9  LABS    PULMONARY No results for input(s): PHART, PCO2ART, PO2ART, HCO3, TCO2, O2SAT in the last 168 hours.  Invalid input(s):  PCO2, PO2   CBC Recent Labs  Lab 09/30/20 0248 10/01/20 0749 10/02/20 0704  HGB 11.0* 11.0* 10.4*  HCT 35.7* 35.8* 34.9*  WBC 17.8* 15.9* 15.9*  PLT 245 216 224    COAGULATION No results for input(s): INR in the last 168 hours.  CARDIAC  No results for input(s): TROPONINI in the last 168 hours. No results for input(s): PROBNP in the last 168 hours.   CHEMISTRY Recent Labs  Lab 09/26/20 0304 09/27/20 0334 09/28/20 0330 09/29/20 0646 09/30/20 0248 10/01/20 0749 10/02/20 0704  NA 143 141 140 139 142 142 143  143  K 4.2 4.2 4.2 3.3* 3.8 4.0 3.8  3.7  CL 93* 94* 95* 92* 93* 93* 94*  93*  CO2 45* 42* 40* 41* 44* 42* 43*  43*  GLUCOSE 191* 179* 208* 154* 103* 100* 99  98  BUN 24* 29* 29* 32* 30* 26* 26*  26*  CREATININE 0.49* 0.50* 0.59* 0.47* 0.55* 0.49* 0.43*  0.52*  CALCIUM 9.2 9.0 8.9 8.9 9.4 9.4 9.3  9.3  MG 2.1 2.0  --  1.9  --  2.1 1.9  PHOS 2.7 3.4  --  3.2 2.7 3.6 3.3  3.3   Estimated Creatinine Clearance: 81.1 mL/min (A) (by C-G formula based on SCr of 0.43 mg/dL (L)).   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  Acute on chronic respiratory failure with hypoxemia and hypercapnia -Tolerating pressure support weaning -Tolerating ATM this morning  Sepsis due to MRSE bacteremia -Port  removed -Appreciate infectious disease -ID assisting with antibiotic therapy -Remains on vancomycin -Wean off stress dose steroids -TEE requested  Relapsed Hodgkin's lymphoma -CT 6/17 showing bulky mediastinal adenopathy -Appreciate oncology input   Best practice (right click and "Reselect all SmartList Selections" daily)  Per [facility]   ATTESTATION & SIGNATURE   The patient is critically ill with multiple organ systems failure and requires high complexity decision making for assessment and support, frequent evaluation and titration of therapies, application of advanced monitoring technologies and extensive interpretation of multiple databases. Critical Care Time devoted to patient care services described in this note  independent of APP/resident time (if applicable)  is 32 minutes.   Sherrilyn Rist MD [facility] Personal pager: [phone] If unanswered, please page CCM On-call: [phone]

## 2020-10-02 NOTE — Progress Notes (Signed)
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 0n 09/24/20, patient has developed respiratory distress with increased oxygen requirement and was transferred to ICU and started on pressure support ventilation. Palliative medicine and infectious disease following.  ID recommended TEE to rule out infective endocarditis and decide about need for PICC line for IV antibiotics.  Cardiology thinks the patient is not stable to have TEE at this time.  Assessment & Plan:   Principal Problem:   Failure to thrive in adult Active Problems:   Pressure ulcer   Tracheostomy dependence ([name])   Chronic respiratory failure with hypoxia ([name])   Hodgkin's lymphoma ([place])   Essential hypertension   Bacteremia  Sepsis due to pneumonia /staph epidermis bacteremia: POA.  Patient presented with tachycardia, tachypnea, leukocytosis on presentation . CXR showed possible recurrent pneumonia but difficult to interpret.   CT chest showed interval increase in bulk of mediastinal and axillary adenopathy.   Cultures grew staph epidermidis.  Port a cath removed on 6/16. Patient remained on cefepime 6/11-6/15. ID recommends IV Vanco for minimum of 2 weeks from 6/14 (first negative cultures) if not able to rule out endocarditis. ID recommended TEE to exclude endocarditis, It was discussed w/ cardiology, He was scheduled for TEE 6/24,  but later cardiology thinks patient is not stable to have TEE.  Consider TEE when more  stable. Continue midodrine for hypotension. He has been on stress dose steroids since 6/11 (hydrocortisone 50 q8).  Discussed w/ pccm, agrees with now tapering. Will space out to 50 IV q12.    Hypotension likely arterial insufficiency from chronic steroid in the setting of sepsis. Continue midodrine and stress dose steroids.   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    CXR and CT chest as above. -On PS MV since 6/16.  PCCM managing.   Relapsed Hodgkin's lymphoma:  CT chest concerning for worsening LAD and progression. Oncology following. He will need central venous access at some point to resume chemotherapy.   DM-2 with hyperglycemia , Uncontrolled.:  Hyperglycemia likely due to steroid. Continue SSI to moderate every 4 hours. Increase NovoLog from 2 to 4 units every 4 hours Continue  lantus to 20 qd Further adjustment as appropriate   Normocytic anemia/anemia of chronic disease:  Continue to monitor. Hb stable.  Hyponatremia: Resolved.   Hypokalemia Resolved. Continue to moniter   Metabolic alkalosis:  Could be due to CO2 retention  He is on mechanical ventilation.  Leukocytosis/bandemia: Likely due to steroid versus sepsis. Continue to monitor   Severe protein calorie malnutrition: POA. Tolerating some by mouth, also receiving tube feeds while mechanically ventilated   Body mass index is 23.22 kg/m. Nutrition Problem: Severe Malnutrition Etiology: chronic illness, cancer and cancer related treatments Signs/Symptoms: severe fat depletion, severe muscle depletion Interventions: Tube feeding, Prostat, MVI   DVT prophylaxis: Lovenox. Code Status: Full code. Family Communication: No family at bed side. Disposition Plan:    Status is:  Inpatient  Remains inpatient appropriate because:Inpatient level of care appropriate due to severity of  illness  Dispo: The patient is from: Home              Anticipated d/c is to: SNF              Patient currently is not medically stable to d/c.   Difficult to place patient No  Consultants:  ID PCCM Cardiology  Procedures:  TEE on 6/24. Antimicrobials:   Anti-infectives (From admission, onward)    Start     Dose/Rate Route Frequency Ordered Stop   09/23/20 0800  ceFEPIme (MAXIPIME) 2 g in sodium chloride 0.9 % 100 mL IVPB  Status:  Discontinued        2 g 200 mL/hr over 30 Minutes Intravenous Every 8 hours 09/23/20 0738 09/23/20 1758   09/21/20 0400  vancomycin (VANCOREADY) IVPB 750 mg/150 mL        750 mg 150 mL/hr over 60 Minutes Intravenous Every 12 hours 09/20/20 1715     09/20/20 1730  vancomycin (VANCOREADY) IVPB 750 mg/150 mL        750 mg 150 mL/hr over 60 Minutes Intravenous STAT 09/20/20 1714 09/20/20 1954   09/20/20 0400  vancomycin (VANCOREADY) IVPB 750 mg/150 mL  Status:  Discontinued        750 mg 150 mL/hr over 60 Minutes Intravenous Every 12 hours 09/19/20 1932 09/20/20 1155   09/20/20 0000  ceFEPIme (MAXIPIME) 2 g in sodium chloride 0.9 % 100 mL IVPB  Status:  Discontinued        2 g 200 mL/hr over 30 Minutes Intravenous Every 8 hours 09/19/20 1932 09/23/20 0721   09/19/20 1600  ceFEPIme (MAXIPIME) 2 g in sodium chloride 0.9 % 100 mL IVPB        2 g 200 mL/hr over 30 Minutes Intravenous  Once 09/19/20 1548 09/19/20 1634   09/19/20 1600  vancomycin (VANCOREADY) IVPB 1250 mg/250 mL        1,250 mg 166.7 mL/hr over 90 Minutes Intravenous STAT 09/19/20 1557 09/19/20 1822        Subjective: Patient was seen and examined at bedside. Overnight events noted.   He reports feeling better,  denies any chest pain.  He was briefly placed on ventilator last night. He is on trach collar and has a PEG tube. He is getting feeding through the PEG tube, Cardiology thinks patient is not stable for TEE at this time.  Objective: Vitals:   10/02/20 0819 10/02/20 0900  10/02/20 0916 10/02/20 1005  BP:  (!) 142/82  120/67  Pulse:  (!) 102 80 100  Resp:  (!) 33 (!) 33 (!) 38  Temp: 98.5 F (36.9 C)     TempSrc: Oral     SpO2:  94% 93% 99%  Weight:      Height:        Intake/Output Summary (Last 24 hours) at 10/02/2020 1127 Last data filed at 10/02/2020 0830 Gross per 24 hour  Intake 1738.37 ml  Output 1550 ml  Net 188.37 ml   Filed Weights   09/19/20 1930 09/26/20 0258 09/30/20 1646  Weight: 63.3 kg 63.3 kg 69 kg    Examination:  General exam: Appears calm and comfortable, not in any acute distress.  Has a trach collar. Respiratory system: Clear to auscultation. Respiratory effort normal. coughing on suction. Cardiovascular system: S1 & S2 heard, RRR. No JVD, murmurs, rubs, gallops or clicks. No pedal edema. Gastrointestinal system: Abdomen is  nondistended, soft and nontender. No organomegaly or masses felt. Normal bowel sounds heard.  PEG tube : no signs of erythema or redness noted. Central nervous system: Alert and oriented. No focal neurological deficits. Extremities: Symmetric 5 x 5 power. No edema, No cyanosis. Skin: No rashes, lesions or ulcers Psychiatry: Judgement and insight appear normal. Mood & affect appropriate.     Data Reviewed: I have personally reviewed following labs and imaging studies  CBC: Recent Labs  Lab 09/26/20 0304 09/27/20 0334 09/28/20 0330 09/29/20 0256 09/29/20 0646 09/30/20 0248 10/01/20 0749 10/02/20 0704  WBC 17.8*   < > 17.8*  --  18.0* 17.8* 15.9* 15.9*  NEUTROABS 15.4*  --   --   --  14.2*  --   --   --   HGB 8.9*   < > 8.0* 11.1* 10.3* 11.0* 11.0* 10.4*  HCT 29.8*   < > 25.7* 34.9* 32.4* 35.7* 35.8* 34.9*  MCV 96.1   < > 93.8  --  91.5 94.7 96.8 97.8  PLT 214   < > 196  --  204 245 216 224   < > = values in this interval not displayed.   Basic Metabolic Panel: Recent Labs  Lab 09/26/20 0304 09/27/20 0334 09/28/20 0330 09/29/20 0646 09/30/20 0248 10/01/20 0749 10/02/20 0704  NA 143  141 140 139 142 142 143  143  K 4.2 4.2 4.2 3.3* 3.8 4.0 3.8  3.7  CL 93* 94* 95* 92* 93* 93* 94*  93*  CO2 45* 42* 40* 41* 44* 42* 43*  43*  GLUCOSE 191* 179* 208* 154* 103* 100* 99  98  BUN 24* 29* 29* 32* 30* 26* 26*  26*  CREATININE 0.49* 0.50* 0.59* 0.47* 0.55* 0.49* 0.43*  0.52*  CALCIUM 9.2 9.0 8.9 8.9 9.4 9.4 9.3  9.3  MG 2.1 2.0  --  1.9  --  2.1 1.9  PHOS 2.7 3.4  --  3.2 2.7 3.6 3.3  3.3   GFR: Estimated Creatinine Clearance: 81.1 mL/min (A) (by C-G formula based on SCr of 0.43 mg/dL (L)). Liver Function Tests: Recent Labs  Lab 09/26/20 0304 09/27/20 0334 09/29/20 0646 09/30/20 0248 10/01/20 0749 10/02/20 0704  AST 13*  --   --   --   --   --   ALT 18  --   --   --   --   --   ALKPHOS 61  --   --   --   --   --   BILITOT 0.3  --   --   --   --   --   PROT 5.7*  --   --   --   --   --   ALBUMIN 2.0* 1.9* 2.0* 2.0* 2.0* 2.1*   No results for input(s): LIPASE, AMYLASE in the last 168 hours. No results for input(s): AMMONIA in the last 168 hours. Coagulation Profile: No results for input(s): INR, PROTIME in the last 168 hours. Cardiac Enzymes: No results for input(s): CKTOTAL, CKMB, CKMBINDEX, TROPONINI in the last 168 hours. BNP (last 3 results) No results for input(s): PROBNP in the last 8760 hours. HbA1C: No results for input(s): HGBA1C in the last 72 hours. CBG: Recent Labs  Lab 10/01/20 1532 10/01/20 1946 10/01/20 2345 10/02/20 0327 10/02/20 0738  GLUCAP 194* 81 201* 270* 114*   Lipid Profile: No results for input(s): CHOL, HDL, LDLCALC, TRIG, CHOLHDL, LDLDIRECT in the last 72 hours. Thyroid Function Tests: No results for input(s): TSH,  T4TOTAL, FREET4, T3FREE, THYROIDAB in the last 72 hours. Anemia Panel: No results for input(s): VITAMINB12, FOLATE, FERRITIN, TIBC, IRON, RETICCTPCT in the last 72 hours. Sepsis Labs: Recent Labs  Lab 09/28/20 0330  LATICACIDVEN 0.9    Recent Results (from the past 240 hour(s))  Culture, blood  (Routine X 2) w Reflex to ID Panel     Status: None   Collection Time: 09/22/20  7:06 PM   Specimen: BLOOD  Result Value Ref Range Status   Specimen Description   Final    BLOOD RIGHT ANTECUBITAL Performed at [facility] [address], [place], [place] [address]    Special Requests   Final    BOTTLES DRAWN AEROBIC AND ANAEROBIC Blood Culture adequate volume Performed at [facility] [address], [place], [place] [address]    Culture   Final    NO GROWTH 5 DAYS Performed at [facility], [place] [address] [place], [place] [address]    Report Status 09/27/2020 FINAL  Final  Culture, blood (Routine X 2) w Reflex to ID Panel     Status: None   Collection Time: 09/22/20  7:06 PM   Specimen: BLOOD RIGHT HAND  Result Value Ref Range Status   Specimen Description   Final    BLOOD RIGHT HAND Performed at [facility] [address], [place], [place] [address]    Special Requests   Final    BOTTLES DRAWN AEROBIC ONLY Blood Culture adequate volume Performed at [facility] [address], [place], [place] [address]    Culture   Final    NO GROWTH 5 DAYS Performed at [facility], [place] [address], [place], [place] [address]    Report Status 09/27/2020 FINAL  Final  MRSA PCR Screening     Status: None   Collection Time: 09/24/20 10:47 AM  Result Value Ref Range Status   MRSA by PCR NEGATIVE NEGATIVE Final    Comment:        The GeneXpert MRSA Assay (FDA approved for NASAL specimens only), is one component of a comprehensive MRSA colonization surveillance program. It is not intended to diagnose MRSA infection nor to guide or monitor treatment for MRSA infections. Performed at [facility], [place] [address] [place], [place] [address]     Radiology Studies: No results found.  Scheduled Meds:  chlorhexidine gluconate (MEDLINE KIT)  15 mL Mouth Rinse BID    Chlorhexidine Gluconate Cloth  6 each Topical Q0600   cholecalciferol  10,000 Units Oral Daily   enoxaparin (LOVENOX) injection  40 mg Subcutaneous Q24H   free water  100 mL Per Tube Q6H   hydrocortisone sod succinate (SOLU-CORTEF) inj  50 mg Intravenous Q12H   insulin aspart  0-15 Units Subcutaneous Q4H   insulin aspart  4 Units Subcutaneous Q4H   insulin glargine  20 Units Subcutaneous Daily   mouth rinse  15 mL Mouth Rinse 10 times per day   midodrine  5 mg Per Tube TID WC   multivitamin  15 mL Per Tube Daily   pantoprazole sodium  40 mg Per Tube Daily   sertraline  50 mg Per Tube Daily   traZODone  100 mg Per Tube QHS   Continuous Infusions:  feeding supplement (VITAL AF 1.2 CAL) 1,000 mL (10/02/20 0935)   vancomycin Stopped (10/02/20 0435)     LOS: 13 days    Time spent: 35 mins  Shawna Clamp, MD [facility]   If 7PM-7AM, please contact night-coverage

## 2020-10-03 LAB — CBC
HCT: 34.3 % — ABNORMAL LOW (ref 39.0–52.0)
Hemoglobin: 10 g/dL — ABNORMAL LOW (ref 13.0–17.0)
MCH: 28.9 pg (ref 26.0–34.0)
MCHC: 29.2 g/dL — ABNORMAL LOW (ref 30.0–36.0)
MCV: 99.1 fL (ref 80.0–100.0)
Platelets: 254 10*3/uL (ref 150–400)
RBC: 3.46 MIL/uL — ABNORMAL LOW (ref 4.22–5.81)
RDW: 17.3 % — ABNORMAL HIGH (ref 11.5–15.5)
WBC: 12.5 10*3/uL — ABNORMAL HIGH (ref 4.0–10.5)
nRBC: 0 % (ref 0.0–0.2)

## 2020-10-03 LAB — GLUCOSE, CAPILLARY
Glucose-Capillary: 117 mg/dL — ABNORMAL HIGH (ref 70–99)
Glucose-Capillary: 138 mg/dL — ABNORMAL HIGH (ref 70–99)
Glucose-Capillary: 108 mg/dL — ABNORMAL HIGH (ref 70–99)
Glucose-Capillary: 140 mg/dL — ABNORMAL HIGH (ref 70–99)
Glucose-Capillary: 148 mg/dL — ABNORMAL HIGH (ref 70–99)
Glucose-Capillary: 83 mg/dL (ref 70–99)

## 2020-10-03 LAB — RENAL FUNCTION PANEL
Albumin: 1.9 g/dL — ABNORMAL LOW (ref 3.5–5.0)
Anion gap: 4 — ABNORMAL LOW (ref 5–15)
BUN: 27 mg/dL — ABNORMAL HIGH (ref 8–23)
CO2: 45 mmol/L — ABNORMAL HIGH (ref 22–32)
Calcium: 9.3 mg/dL (ref 8.9–10.3)
Chloride: 94 mmol/L — ABNORMAL LOW (ref 98–111)
Creatinine, Ser: 0.52 mg/dL — ABNORMAL LOW (ref 0.61–1.24)
GFR, Estimated: >60 mL/min (ref >60)
Glucose, Bld: 116 mg/dL — ABNORMAL HIGH (ref 70–99)
Phosphorus: 3.4 mg/dL (ref 2.5–4.6)
Potassium: 3.6 mmol/L (ref 3.5–5.1)
Sodium: 143 mmol/L (ref 135–145)

## 2020-10-03 LAB — CREATININE, SERUM
Creatinine, Ser: 0.52 mg/dL — ABNORMAL LOW (ref 0.61–1.24)
GFR, Estimated: >60 mL/min (ref >60)

## 2020-10-03 MED ORDER — INSULIN GLARGINE 100 UNIT/ML ~~LOC~~ SOLN
16.0000 [IU] | Freq: Every day | SUBCUTANEOUS | Status: DC
  Administered 2020-10-04 – 2020-10-07 (×4): 16 [IU] via SUBCUTANEOUS
  Filled 2020-10-03 (×6): qty 0.16

## 2020-10-03 NOTE — Progress Notes (Signed)
NAME:  Steve [name], MRN:  [id number], DOB:  07/10/1955, LOS: 15 ADMISSION DATE:  09/19/2020, CONSULTATION DATE:  09/24/20 REFERRING MD:  Dr Nolberto Hanlon, CHIEF COMPLAINT:  Resp failure   BRIEF   65 year old male who is well kwn to PCCM service from [place] 2021 for s/p covid, respiratoy failure, ARDS, pneumothorax, prolonged mechanical ventilation (? 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  At baseline.   Port-A-Cath placement in December 2020 and gastrostomy tube placement in May 2021.  Of note , has Hodgkins Lymphoma  with recuirrence March 2022 (Bulky intrathoracic adenopathy esp new substernal and newosseous metastatic disease, PET 06/19/2020.). As of April 2022 got 2nd dose Adcetris.  The admitted May 2022 for "pneumonia" and Acetris help (in part due to high LFT) and all Rx held.  Also as a preadmission problem: Chronic caxhexia, protein calorie malnutrition - moderate, failure thrive, DM all as his baseline prior to admission problems/ his baseline ECOG May 2022 was 2  Admitted 09/19/20 (recent baseline 8LO2 via trach). With worsening cough, lethargy.  Blood culture came back positive with Staphylococcus epidermis on 09/22/20 (all 4 of 4 cutuirs), infectious disease and s/p Port-A-Cath removal 09/22/20. As a present on admission problem O 09/22/20 - was needed 10L O2. CT scan this admit 09/20/2020 shwoed worrsening lymphadenopathy in chest concerning for Hodgkins progression. On 6/15 - Seen by Dr Marin Olp and recommended ICE protocol when better (highly immune suppressive regimen). ID had recommended 2 week Rx  . On 6/16 - reports he is more hypoxemic needeing >90% fio2 via Trach collar though no increased work of breathing. CCM consulted  Remains full code despite multiple conversations  Pertinent  Medical History  Covid admission March - May 2021  Significant Hospital  Events: Including procedures, antibiotic start and stop dates in addition to other pertinent events   6/16 - ccm consult  6/17 -CT angiogram ruled out pulmonary embolism.  He has progressive Hodgkin's disease on the CT scan of the chest.  He was unable to sustain facemask oxygen and has required being placed on pressure support ventilation through the tracheostomy.  He now has a cuffed 6 sized tracheostomy.  Currently on 50% oxygen and saturating well and more comfortable. 6/18 -stuck on pressure support ventilator at 40% FiO2.  Unable to wean off.  He is watching TV and is able to eat 6/22 slowly progressing PSV wean 6/24 tolerating ATM 6/25 tolerated ATM for about 1 and half hours  Interim History / Subjective:  Slept well No significant respiratory complaint  Objective   Blood pressure 121/73, pulse 90, temperature 98.1 F (36.7 C), temperature source Oral, resp. rate (!) 28, height 5' 5"  (1.651 m), weight 69 kg, SpO2 97 %.    Vent Mode: PSV;CPAP FiO2 (%):  [45 %-80 %] 45 % PEEP:  [5 cmH20] 5 cmH20 Pressure Support:  [8 cmH20] 8 cmH20 Plateau Pressure:  [13 cmH20-23 cmH20] 13 cmH20   Intake/Output Summary (Last 24 hours) at 10/03/2020 1536 Last data filed at 10/03/2020 1526 Gross per 24 hour  Intake 208.98 ml  Output 1700 ml  Net -1491.02 ml   Filed Weights   09/19/20 1930 09/26/20 0258 09/30/20 1646  Weight: 63.3 kg 63.3 kg 69 kg    Examination: Gen: Middle-aged, appears comfortable HEENT: Moist oral mucosa but Neck: Trach site appears clean Lungs: Rhonchi CV: S1-S2 appreciated Abd: Bowel sounds appreciated Ext: Trace pretibial  edema Neuro: Alert and interactive  Labs/imaging that I havepersonally reviewed  (right click and "Reselect all SmartList Selections" daily)   Lab data reviewed, bicarb of 43 White count 15.9  LABS    PULMONARY No results for input(s): PHART, PCO2ART, PO2ART, HCO3, TCO2, O2SAT in the last 168 hours.  Invalid input(s): PCO2,  PO2   CBC Recent Labs  Lab 10/01/20 0749 10/02/20 0704 10/03/20 0317  HGB 11.0* 10.4* 10.0*  HCT 35.8* 34.9* 34.3*  WBC 15.9* 15.9* 12.5*  PLT 216 224 254    COAGULATION No results for input(s): INR in the last 168 hours.  CARDIAC  No results for input(s): TROPONINI in the last 168 hours. No results for input(s): PROBNP in the last 168 hours.   CHEMISTRY Recent Labs  Lab 09/27/20 0334 09/28/20 0330 09/29/20 0646 09/30/20 0248 10/01/20 0749 10/02/20 0704 10/03/20 0317  NA 141   < > 139 142 142 143  143 143  K 4.2   < > 3.3* 3.8 4.0 3.8  3.7 3.6  CL 94*   < > 92* 93* 93* 94*  93* 94*  CO2 42*   < > 41* 44* 42* 43*  43* 45*  GLUCOSE 179*   < > 154* 103* 100* 99  98 116*  BUN 29*   < > 32* 30* 26* 26*  26* 27*  CREATININE 0.50*   < > 0.47* 0.55* 0.49* 0.43*  0.52* 0.52*  0.52*  CALCIUM 9.0   < > 8.9 9.4 9.4 9.3  9.3 9.3  MG 2.0  --  1.9  --  2.1 1.9  --   PHOS 3.4  --  3.2 2.7 3.6 3.3  3.3 3.4   < > = values in this interval not displayed.   Estimated Creatinine Clearance: 81.1 mL/min (A) (by C-G formula based on SCr of 0.52 mg/dL (L)).   LIVER Recent Labs  Lab 09/29/20 0646 09/30/20 0248 10/01/20 0749 10/02/20 0704 10/03/20 0317  ALBUMIN 2.0* 2.0* 2.0* 2.1* 1.9*     INFECTIOUS Recent Labs  Lab 09/28/20 0330  LATICACIDVEN 0.9      ENDOCRINE CBG (last 3)  Recent Labs    10/03/20 0842 10/03/20 1131 10/03/20 1530  GLUCAP 138* 108* 140*      Resolved Hospital Problem list    Assessment & Plan:   Chronic respiratory failure Chronic tracheostomy post COVID pulmonary fibrosis Protein calorie malnutrition with cachexia  Acute on chronic respiratory failure with hypoxemia and hypercapnia -Will continue to trial ATM as tolerated  Sepsis due to MRSA bacteremia -Appreciate infectious disease input -Infectious disease continues to assist with antibiotic therapy -Remains on vancomycin -Off stress dose steroids  Relapsed  Hodgkin's lymphoma -CT 6/17 showing bulky mediastinal adenopathy -Appreciate oncology input   Best practice (right click and "Reselect all SmartList Selections" daily)  Per [facility]  Sherrilyn Rist, MD [place] [facility] Pager: [phone]

## 2020-10-03 NOTE — Progress Notes (Signed)
RT placed pt on TC 80%. No complications at this time.

## 2020-10-03 NOTE — Progress Notes (Signed)
Pt requested to back on vent due to SOB.

## 2020-10-03 NOTE — Progress Notes (Addendum)
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 0n 09/24/20, patient has developed respiratory distress with increased oxygen requirement and was transferred to ICU and started on pressure support ventilation. Palliative medicine and infectious disease following.  ID recommended TEE to rule out infective endocarditis and decide about need for PICC line for IV antibiotics.  Cardiology thinks the patient is not stable to have TEE at this time.  Assessment & Plan:   Principal Problem:   Failure to thrive in adult Active Problems:   Pressure ulcer   Tracheostomy dependence ([name])   Chronic respiratory failure with hypoxia ([name])   Hodgkin's lymphoma ([place])   Essential hypertension   Bacteremia  Sepsis due to pneumonia /staph epidermis bacteremia: POA.  Patient presented with tachycardia, tachypnea, leukocytosis on presentation . CXR showed possible recurrent pneumonia but difficult to interpret.   CT chest showed interval increase in bulk of mediastinal and axillary adenopathy.   Cultures grew staph epidermidis.  Port a cath removed on 6/16. Patient remained on cefepime 6/11-6/15. ID recommends IV Vanco for minimum of 2 weeks from 6/14 (first negative cultures) if TEE negative for endocarditis. ID recommended TEE to exclude endocarditis, It was discussed w/ cardiology, He was scheduled for TEE 6/24,  but later cardiology thinks patient is not stable to have TEE.  Consider TEE when more  stable. Continue midodrine for hypotension. stress dose steroids taperd off 6/24. PCCM helps with co management. Will discuss with ID about the duration of antibiotics if unable to do TEE.   Hypotension likely arterial insufficiency from chronic steroid in the setting of sepsis. Continue midodrine , stress dose steroids tapered off.   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    CXR and CT chest as above. -On PS MV since 6/16.  PCCM managing.   Relapsed Hodgkin's lymphoma:  CT chest concerning for worsening LAD and progression. Oncology following. He will need central venous access at some point to resume chemotherapy.   DM-2 with hyperglycemia , Uncontrolled.:  Hyperglycemia likely due to steroid. Continue SSI to moderate every 4 hours. Increase NovoLog from 2 to 4 units every 4 hours Reduce Lantus to 16 units as he had hypoglycemic episode last night,  after his steroid dose tapered off. Further adjustment as appropriate   Normocytic anemia/anemia of chronic disease:  Continue to monitor. Hb stable.  Hyponatremia: Resolved.   Hypokalemia Resolved. Continue to moniter   Metabolic alkalosis:  Could be due to CO2 retention  He is on mechanical ventilation.  Leukocytosis/bandemia: Likely due to steroid versus sepsis. Continue to monitor   Severe protein calorie malnutrition: POA. Tolerating some by mouth, also receiving tube feeds while mechanically ventilated   Body mass index is 23.22 kg/m. Nutrition Problem: Severe Malnutrition Etiology: chronic illness, cancer and cancer related treatments Signs/Symptoms: severe fat depletion, severe muscle depletion Interventions: Tube feeding, Prostat, MVI   DVT prophylaxis: Lovenox. Code Status: Full code. Family Communication: No family  at bed side. Disposition Plan:    Status is: Inpatient  Remains  inpatient appropriate because:Inpatient level of care appropriate due to severity of illness  Dispo: The patient is from: Home              Anticipated d/c is to: SNF              Patient currently is not medically stable to d/c.   Difficult to place patient No  Consultants:  ID PCCM Cardiology  Procedures:  TEE on 6/24. Antimicrobials:   Anti-infectives (From admission, onward)    Start     Dose/Rate Route Frequency Ordered Stop   09/23/20 0800  ceFEPIme (MAXIPIME) 2 g in sodium chloride 0.9 % 100 mL IVPB  Status:  Discontinued        2 g 200 mL/hr over 30 Minutes Intravenous Every 8 hours 09/23/20 0738 09/23/20 1758   09/21/20 0400  vancomycin (VANCOREADY) IVPB 750 mg/150 mL        750 mg 150 mL/hr over 60 Minutes Intravenous Every 12 hours 09/20/20 1715     09/20/20 1730  vancomycin (VANCOREADY) IVPB 750 mg/150 mL        750 mg 150 mL/hr over 60 Minutes Intravenous STAT 09/20/20 1714 09/20/20 1954   09/20/20 0400  vancomycin (VANCOREADY) IVPB 750 mg/150 mL  Status:  Discontinued        750 mg 150 mL/hr over 60 Minutes Intravenous Every 12 hours 09/19/20 1932 09/20/20 1155   09/20/20 0000  ceFEPIme (MAXIPIME) 2 g in sodium chloride 0.9 % 100 mL IVPB  Status:  Discontinued        2 g 200 mL/hr over 30 Minutes Intravenous Every 8 hours 09/19/20 1932 09/23/20 0721   09/19/20 1600  ceFEPIme (MAXIPIME) 2 g in sodium chloride 0.9 % 100 mL IVPB        2 g 200 mL/hr over 30 Minutes Intravenous  Once 09/19/20 1548 09/19/20 1634   09/19/20 1600  vancomycin (VANCOREADY) IVPB 1250 mg/250 mL        1,250 mg 166.7 mL/hr over 90 Minutes Intravenous STAT 09/19/20 1557 09/19/20 1822        Subjective: Patient was seen and examined at bedside. Overnight events noted.   He reports feeling better,  denies any chest pain. He is on trach collar and has a PEG tube.  TEE canceled since cardiology thinks he is not stable for procedure.  Objective: Vitals:   10/03/20 0800 10/03/20 0900  10/03/20 0947 10/03/20 1000  BP: 118/86 (!) 129/57  119/60  Pulse: 97 87 91 90  Resp: (!) 35 (!) 30 (!) 28 (!) 32  Temp: 97.6 F (36.4 C)     TempSrc: Axillary     SpO2: 97% 97% 94% 92%  Weight:      Height:        Intake/Output Summary (Last 24 hours) at 10/03/2020 1121 Last data filed at 10/03/2020 0954 Gross per 24 hour  Intake 208.98 ml  Output 1350 ml  Net -1141.02 ml   Filed Weights   09/19/20 1930 09/26/20 0258 09/30/20 1646  Weight: 63.3 kg 63.3 kg 69 kg    Examination:  General exam: Appears calm and comfortable, not in any acute distress.  Has a trach collar. Respiratory system: Clear to auscultation. Respiratory effort normal. coughing on suction. Cardiovascular system: S1 & S2 heard, RRR. No JVD, murmurs, rubs, gallops or clicks. No pedal edema. Gastrointestinal system: Abdomen is nondistended, soft and nontender. No organomegaly  or masses felt.  Normal bowel sounds heard.  PEG tube : no signs of erythema or redness noted. Central nervous system: Alert and oriented. No focal neurological deficits. Extremities: Symmetric 5 x 5 power. No edema, No cyanosis. Skin: No rashes, lesions or ulcers Psychiatry: Judgement and insight appear normal. Mood & affect appropriate.     Data Reviewed: I have personally reviewed following labs and imaging studies  CBC: Recent Labs  Lab 09/29/20 0646 09/30/20 0248 10/01/20 0749 10/02/20 0704 10/03/20 0317  WBC 18.0* 17.8* 15.9* 15.9* 12.5*  NEUTROABS 14.2*  --   --   --   --   HGB 10.3* 11.0* 11.0* 10.4* 10.0*  HCT 32.4* 35.7* 35.8* 34.9* 34.3*  MCV 91.5 94.7 96.8 97.8 99.1  PLT 204 245 216 224 832   Basic Metabolic Panel: Recent Labs  Lab 09/27/20 0334 09/28/20 0330 09/29/20 0646 09/30/20 0248 10/01/20 0749 10/02/20 0704 10/03/20 0317  NA 141   < > 139 142 142 143  143 143  K 4.2   < > 3.3* 3.8 4.0 3.8  3.7 3.6  CL 94*   < > 92* 93* 93* 94*  93* 94*  CO2 42*   < > 41* 44* 42* 43*  43* 45*  GLUCOSE 179*    < > 154* 103* 100* 99  98 116*  BUN 29*   < > 32* 30* 26* 26*  26* 27*  CREATININE 0.50*   < > 0.47* 0.55* 0.49* 0.43*  0.52* 0.52*  0.52*  CALCIUM 9.0   < > 8.9 9.4 9.4 9.3  9.3 9.3  MG 2.0  --  1.9  --  2.1 1.9  --   PHOS 3.4  --  3.2 2.7 3.6 3.3  3.3 3.4   < > = values in this interval not displayed.   GFR: Estimated Creatinine Clearance: 81.1 mL/min (A) (by C-G formula based on SCr of 0.52 mg/dL (L)). Liver Function Tests: Recent Labs  Lab 09/29/20 0646 09/30/20 0248 10/01/20 0749 10/02/20 0704 10/03/20 0317  ALBUMIN 2.0* 2.0* 2.0* 2.1* 1.9*   No results for input(s): LIPASE, AMYLASE in the last 168 hours. No results for input(s): AMMONIA in the last 168 hours. Coagulation Profile: No results for input(s): INR, PROTIME in the last 168 hours. Cardiac Enzymes: No results for input(s): CKTOTAL, CKMB, CKMBINDEX, TROPONINI in the last 168 hours. BNP (last 3 results) No results for input(s): PROBNP in the last 8760 hours. HbA1C: No results for input(s): HGBA1C in the last 72 hours. CBG: Recent Labs  Lab 10/02/20 1925 10/02/20 2322 10/02/20 2358 10/03/20 0333 10/03/20 0842  GLUCAP 155* 62* 159* 117* 138*   Lipid Profile: No results for input(s): CHOL, HDL, LDLCALC, TRIG, CHOLHDL, LDLDIRECT in the last 72 hours. Thyroid Function Tests: No results for input(s): TSH, T4TOTAL, FREET4, T3FREE, THYROIDAB in the last 72 hours. Anemia Panel: No results for input(s): VITAMINB12, FOLATE, FERRITIN, TIBC, IRON, RETICCTPCT in the last 72 hours. Sepsis Labs: Recent Labs  Lab 09/28/20 0330  LATICACIDVEN 0.9    Recent Results (from the past 240 hour(s))  MRSA PCR Screening     Status: None   Collection Time: 09/24/20 10:47 AM  Result Value Ref Range Status   MRSA by PCR NEGATIVE NEGATIVE Final    Comment:        The GeneXpert MRSA Assay (FDA approved for NASAL specimens only), is one component of a comprehensive MRSA colonization surveillance program. It is  not intended to diagnose MRSA infection nor  to guide or monitor treatment for MRSA infections. Performed at [facility], [place] [address] [place], [place] [address]     Radiology Studies: No results found.  Scheduled Meds:  chlorhexidine gluconate (MEDLINE KIT)  15 mL Mouth Rinse BID   Chlorhexidine Gluconate Cloth  6 each Topical Q0600   cholecalciferol  10,000 Units Oral Daily   dextrose       enoxaparin (LOVENOX) injection  40 mg Subcutaneous Q24H   free water  100 mL Per Tube Q6H   insulin aspart  0-15 Units Subcutaneous Q4H   insulin aspart  4 Units Subcutaneous Q4H   [START ON 10/04/2020] insulin glargine  16 Units Subcutaneous Daily   mouth rinse  15 mL Mouth Rinse 10 times per day   midodrine  5 mg Per Tube TID WC   multivitamin  15 mL Per Tube Daily   pantoprazole sodium  40 mg Per Tube Daily   sertraline  50 mg Per Tube Daily   traZODone  100 mg Per Tube QHS   Continuous Infusions:  feeding supplement (VITAL AF 1.2 CAL) 1,000 mL (10/02/20 0935)   vancomycin Stopped (10/03/20 0437)     LOS: 14 days    Time spent: 35 mins    Praneeth Bussey, MD [facility]   If 7PM-7AM, please contact night-coverage

## 2020-10-04 LAB — CBC
HCT: 36.2 % — ABNORMAL LOW (ref 39.0–52.0)
Hemoglobin: 10.5 g/dL — ABNORMAL LOW (ref 13.0–17.0)
MCH: 28.8 pg (ref 26.0–34.0)
MCHC: 29 g/dL — ABNORMAL LOW (ref 30.0–36.0)
MCV: 99.5 fL (ref 80.0–100.0)
Platelets: 248 10*3/uL (ref 150–400)
RBC: 3.64 MIL/uL — ABNORMAL LOW (ref 4.22–5.81)
RDW: 17.2 % — ABNORMAL HIGH (ref 11.5–15.5)
WBC: 15.9 10*3/uL — ABNORMAL HIGH (ref 4.0–10.5)
nRBC: 0 % (ref 0.0–0.2)

## 2020-10-04 LAB — BASIC METABOLIC PANEL
BUN: 25 mg/dL — ABNORMAL HIGH (ref 8–23)
CO2: >50 mmol/L — ABNORMAL HIGH (ref 22–32)
Calcium: 9.7 mg/dL (ref 8.9–10.3)
Chloride: 89 mmol/L — ABNORMAL LOW (ref 98–111)
Creatinine, Ser: 0.63 mg/dL (ref 0.61–1.24)
GFR, Estimated: >60 mL/min (ref >60)
Glucose, Bld: 142 mg/dL — ABNORMAL HIGH (ref 70–99)
Potassium: 4.8 mmol/L (ref 3.5–5.1)
Sodium: 142 mmol/L (ref 135–145)

## 2020-10-04 LAB — RENAL FUNCTION PANEL
Albumin: 2.2 g/dL — ABNORMAL LOW (ref 3.5–5.0)
Anion gap: 6 (ref 5–15)
BUN: 26 mg/dL — ABNORMAL HIGH (ref 8–23)
CO2: 45 mmol/L — ABNORMAL HIGH (ref 22–32)
Calcium: 9.5 mg/dL (ref 8.9–10.3)
Chloride: 92 mmol/L — ABNORMAL LOW (ref 98–111)
Creatinine, Ser: 0.58 mg/dL — ABNORMAL LOW (ref 0.61–1.24)
GFR, Estimated: >60 mL/min (ref >60)
Glucose, Bld: 81 mg/dL (ref 70–99)
Phosphorus: 3.9 mg/dL (ref 2.5–4.6)
Potassium: 4.2 mmol/L (ref 3.5–5.1)
Sodium: 143 mmol/L (ref 135–145)

## 2020-10-04 LAB — GLUCOSE, CAPILLARY
Glucose-Capillary: 95 mg/dL (ref 70–99)
Glucose-Capillary: 91 mg/dL (ref 70–99)
Glucose-Capillary: 178 mg/dL — ABNORMAL HIGH (ref 70–99)
Glucose-Capillary: 167 mg/dL — ABNORMAL HIGH (ref 70–99)
Glucose-Capillary: 89 mg/dL (ref 70–99)
Glucose-Capillary: 84 mg/dL (ref 70–99)

## 2020-10-04 LAB — PROTIME-INR
INR: 1 (ref 0.8–1.2)
Prothrombin Time: 13.5 seconds (ref 11.4–15.2)

## 2020-10-04 MED ORDER — ACETAZOLAMIDE 250 MG PO TABS
500.0000 mg | ORAL_TABLET | Freq: Once | ORAL | Status: AC
  Administered 2020-10-04: 500 mg via ORAL
  Filled 2020-10-04: qty 2

## 2020-10-04 MED ORDER — CHLORHEXIDINE GLUCONATE 0.12 % MT SOLN
OROMUCOSAL | Status: AC
  Administered 2020-10-04: 15 mL via OROMUCOSAL
  Filled 2020-10-04: qty 15

## 2020-10-04 MED ORDER — SODIUM CHLORIDE 0.9 % IV SOLN: INTRAVENOUS | Status: DC

## 2020-10-04 NOTE — Progress Notes (Addendum)
Pt took off vent and placed on 80% TC. Pt did not tolerate very long. Rt had to place pt back on vent. Pt never desat ,pt complains of not able to breath.

## 2020-10-04 NOTE — Progress Notes (Signed)
NAME:  Steve [name], MRN:  [id number], DOB:  [birth year], LOS: 19 ADMISSION DATE:  09/19/2020, CONSULTATION DATE:  09/24/20 REFERRING MD:  Dr Nolberto Hanlon, CHIEF COMPLAINT:  Resp failure   BRIEF   65 year old male who is well kwn to PCCM service from [place] for s/p covid, respiratoy failure, ARDS, pneumothorax, prolonged mechanical ventilation (? 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  At baseline.   Port-A-Cath placement in December 2020 and gastrostomy tube placement in May 2021.  Of note , has Hodgkins Lymphoma  with recuirrence March 2022 (Bulky intrathoracic adenopathy esp new substernal and newosseous metastatic disease, PET 06/19/2020.). As of April 2022 got 2nd dose Adcetris.  The admitted May 2022 for "pneumonia" and Acetris help (in part due to high LFT) and all Rx held.  Also as a preadmission problem: Chronic caxhexia, protein calorie malnutrition - moderate, failure thrive, DM all as his baseline prior to admission problems/ his baseline ECOG May 2022 was 2  Admitted 09/19/20 (recent baseline 8LO2 via trach). With worsening cough, lethargy.  Blood culture came back positive with Staphylococcus epidermis on 09/22/20 (all 4 of 4 cutuirs), infectious disease and s/p Port-A-Cath removal 09/22/20. As a present on admission problem O 09/22/20 - was needed 10L O2. CT scan this admit 09/20/2020 shwoed worrsening lymphadenopathy in chest concerning for Hodgkins progression. On 6/15 - Seen by Dr Marin Olp and recommended ICE protocol when better (highly immune suppressive regimen). ID had recommended 2 week Rx  . On 6/16 - reports he is more hypoxemic needeing >90% fio2 via Trach collar though no increased work of breathing. CCM consulted  Remains full code despite multiple conversations  Pertinent  Medical History  Covid admission March - May 2021  Significant Hospital  Events: Including procedures, antibiotic start and stop dates in addition to other pertinent events   6/16 - ccm consult  6/17 -CT angiogram ruled out pulmonary embolism.  He has progressive Hodgkin's disease on the CT scan of the chest.  He was unable to sustain facemask oxygen and has required being placed on pressure support ventilation through the tracheostomy.  He now has a cuffed 6 sized tracheostomy.  Currently on 50% oxygen and saturating well and more comfortable. 6/18 -stuck on pressure support ventilator at 40% FiO2.  Unable to wean off.  He is watching TV and is able to eat 6/22 slowly progressing PSV wean 6/24 tolerating ATM 6/25 tolerated ATM for about 1 and half hours  Interim History / Subjective:  No significant overnight events No respiratory complaints at present On vent  Objective   Blood pressure (!) 97/58, pulse 92, temperature 98.4 F (36.9 C), temperature source Oral, resp. rate (!) 31, height 5' 5"  (1.651 m), weight 69 kg, SpO2 95 %.    Vent Mode: PSV;CPAP FiO2 (%):  [45 %] 45 % PEEP:  [5 cmH20] 5 cmH20 Pressure Support:  [8 cmH20] 8 cmH20 Plateau Pressure:  [17 cmH20-20 cmH20] 20 cmH20   Intake/Output Summary (Last 24 hours) at 10/04/2020 1153 Last data filed at 10/04/2020 1016 Gross per 24 hour  Intake 3168.48 ml  Output 3250 ml  Net -81.52 ml   Filed Weights   09/19/20 1930 09/26/20 0258 09/30/20 1646  Weight: 63.3 kg 63.3 kg 69 kg    Examination: Gen: We will age, appears comfortable HEENT: Moist oral mucosa  Neck: Trach site looks clean Lungs: Decreased air entry, mild  rhonchi CV: S1-S2 appreciated Abd: Bowel sounds appreciated Ext: Trace pretibial edema Neuro: Alert and interactive  Labs/imaging that I havepersonally reviewed  (right click and "Reselect all SmartList Selections" daily)   Lab data reviewed, bicarb of 50 White count 15.9  LABS    PULMONARY No results for input(s): PHART, PCO2ART, PO2ART, HCO3, TCO2, O2SAT in the last  168 hours.  Invalid input(s): PCO2, PO2   CBC Recent Labs  Lab 10/02/20 0704 10/03/20 0317 10/04/20 0247  HGB 10.4* 10.0* 10.5*  HCT 34.9* 34.3* 36.2*  WBC 15.9* 12.5* 15.9*  PLT 224 254 248    COAGULATION Recent Labs  Lab 10/04/20 1005  INR 1.0    CARDIAC  No results for input(s): TROPONINI in the last 168 hours. No results for input(s): PROBNP in the last 168 hours.   CHEMISTRY Recent Labs  Lab 09/29/20 0646 09/30/20 0248 10/01/20 0749 10/02/20 0704 10/03/20 0317 10/04/20 0247 10/04/20 1005  NA 139 142 142 143  143 143 143 142  K 3.3* 3.8 4.0 3.8  3.7 3.6 4.2 4.8  CL 92* 93* 93* 94*  93* 94* 92* 89*  CO2 41* 44* 42* 43*  43* 45* 45* >50*  GLUCOSE 154* 103* 100* 99  98 116* 81 142*  BUN 32* 30* 26* 26*  26* 27* 26* 25*  CREATININE 0.47* 0.55* 0.49* 0.43*  0.52* 0.52*  0.52* 0.58* 0.63  CALCIUM 8.9 9.4 9.4 9.3  9.3 9.3 9.5 9.7  MG 1.9  --  2.1 1.9  --   --   --   PHOS 3.2 2.7 3.6 3.3  3.3 3.4 3.9  --    Estimated Creatinine Clearance: 81.1 mL/min (by C-G formula based on SCr of 0.63 mg/dL).   LIVER Recent Labs  Lab 09/30/20 0248 10/01/20 0749 10/02/20 0704 10/03/20 0317 10/04/20 0247 10/04/20 1005  ALBUMIN 2.0* 2.0* 2.1* 1.9* 2.2*  --   INR  --   --   --   --   --  1.0     INFECTIOUS Recent Labs  Lab 09/28/20 0330  LATICACIDVEN 0.9      ENDOCRINE CBG (last 3)  Recent Labs    10/04/20 0323 10/04/20 0752 10/04/20 1138  GLUCAP 95 91 178*      Resolved Hospital Problem list    Assessment & Plan:   Chronic respiratory failure Chronic tracheostomy post COVID pulmonary fibrosis Protein calorie malnutrition with cachexia  Acute on chronic respiratory failure with hypoxemia and hypercapnia -We will continue ATMS tolerated  Metabolic alkalosis Bicarb increasing -Will give dose of Diamox  Sepsis due to MRSE bacteremia  -Appreciate infectious disease input -Remains on vancomycin -Off steroids  Relapsed Hodgkin's  lymphoma Bulky mediastinal adenopathy on CT on 6/17   Best practice (right click and "Reselect all SmartList Selections" daily)  Per [facility]  Sherrilyn Rist, MD [place] [facility] Pager: [phone]

## 2020-10-04 NOTE — Progress Notes (Signed)
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 0n 09/24/20, patient has developed respiratory distress with increased oxygen requirement and was transferred to ICU and started on pressure support ventilation. Palliative medicine and infectious disease following.  ID recommended TEE to rule out infective endocarditis and decide about need for PICC line for IV antibiotics.  Cardiology thinks the patient is not stable to have TEE at this time.  Assessment & Plan:   Principal Problem:   Failure to thrive in adult Active Problems:   Pressure ulcer   Tracheostomy dependence ([name])   Chronic respiratory failure with hypoxia ([name])   Hodgkin's lymphoma ([place])   Essential hypertension   Bacteremia  Sepsis due to pneumonia /staph epidermis bacteremia: POA.  Patient presented with tachycardia, tachypnea, leukocytosis on presentation . CXR showed possible recurrent pneumonia but difficult to interpret.   CT chest showed interval increase in bulk of mediastinal and axillary adenopathy.   Cultures grew staph epidermidis.  Port a cath removed on 6/16. Patient remained on cefepime 6/11-6/15. ID recommends IV Vanco for minimum of 2 weeks from 6/14 (first negative cultures) if TEE negative for endocarditis. ID recommended TEE to exclude endocarditis, It was discussed w/ cardiology, He was scheduled for TEE 6/24,  but later cardiology thinks patient is not stable to have TEE.  Consider TEE when more  stable. Continue midodrine for hypotension. Stress dose steroids taperd off 6/24. Appreciate PCCM help with co management. Will discuss with ID about the duration of antibiotics if unable to do TEE.   Hypotension likely arterial insufficiency from chronic steroid in the setting of sepsis. Continue midodrine , stress dose steroids tapered off.   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    CXR and CT chest as above. -On PS MV since 6/16.  PCCM managing.   Relapsed Hodgkin's lymphoma:  CT chest concerning for worsening LAD and progression. Oncology following. He will need central venous access at some point to resume chemotherapy.   DM-2 with hyperglycemia , Uncontrolled.:  Hyperglycemia likely due to steroids. Continue SSI to moderate every 4 hours. Increase NovoLog from 2 to 4 units every 4 hours Reduce Lantus to 16 units as he had hypoglycemic episode last night,  after his steroid dose tapered off. Further adjustment as appropriate   Normocytic anemia/anemia of chronic disease:  Continue to monitor. Hb stable.  Hyponatremia: Resolved.   Hypokalemia Resolved. Continue to moniter   Metabolic alkalosis:  Could be due to CO2 retention  He is on mechanical ventilation.  Leukocytosis/bandemia: Likely due to steroid versus sepsis. Continue to monitor   Severe protein calorie malnutrition: POA. Tolerating some by mouth, also receiving tube feeds while mechanically ventilated   Body mass index is 23.22 kg/m. Nutrition Problem: Severe Malnutrition Etiology: chronic illness, cancer and cancer related treatments Signs/Symptoms: severe fat depletion, severe muscle depletion Interventions: Tube feeding, Prostat, MVI   DVT prophylaxis: Lovenox. Code Status: Full code. Family Communication: No  family at bed side. Disposition Plan:    Status is:  Inpatient  Remains inpatient appropriate because:Inpatient level of care appropriate due to severity of illness  Dispo: The patient is from: Home              Anticipated d/c is to: SNF              Patient currently is not medically stable to d/c.   Difficult to place patient No  Consultants:  ID PCCM Cardiology  Procedures:  TEE on 6/24. Antimicrobials:   Anti-infectives (From admission, onward)    Start     Dose/Rate Route Frequency Ordered Stop   09/23/20 0800  ceFEPIme (MAXIPIME) 2 g in sodium chloride 0.9 % 100 mL IVPB  Status:  Discontinued        2 g 200 mL/hr over 30 Minutes Intravenous Every 8 hours 09/23/20 0738 09/23/20 1758   09/21/20 0400  vancomycin (VANCOREADY) IVPB 750 mg/150 mL        750 mg 150 mL/hr over 60 Minutes Intravenous Every 12 hours 09/20/20 1715     09/20/20 1730  vancomycin (VANCOREADY) IVPB 750 mg/150 mL        750 mg 150 mL/hr over 60 Minutes Intravenous STAT 09/20/20 1714 09/20/20 1954   09/20/20 0400  vancomycin (VANCOREADY) IVPB 750 mg/150 mL  Status:  Discontinued        750 mg 150 mL/hr over 60 Minutes Intravenous Every 12 hours 09/19/20 1932 09/20/20 1155   09/20/20 0000  ceFEPIme (MAXIPIME) 2 g in sodium chloride 0.9 % 100 mL IVPB  Status:  Discontinued        2 g 200 mL/hr over 30 Minutes Intravenous Every 8 hours 09/19/20 1932 09/23/20 0721   09/19/20 1600  ceFEPIme (MAXIPIME) 2 g in sodium chloride 0.9 % 100 mL IVPB        2 g 200 mL/hr over 30 Minutes Intravenous  Once 09/19/20 1548 09/19/20 1634   09/19/20 1600  vancomycin (VANCOREADY) IVPB 1250 mg/250 mL        1,250 mg 166.7 mL/hr over 90 Minutes Intravenous STAT 09/19/20 1557 09/19/20 1822        Subjective: Patient was seen and examined at bedside. Overnight events noted.   He reports feeling better,  denies any chest pain. He is on trach collar and has a PEG tube.  TEE canceled since cardiology thinks he is not stable for procedure. He denies any other  concerns.  Objective: Vitals:   10/04/20 0800 10/04/20 0849 10/04/20 0900 10/04/20 1000  BP: (!) 97/58  117/61 (!) 97/58  Pulse: 85 91 95 92  Resp: (!) 33 (!) 34 (!) 23 (!) 31  Temp: 98.4 F (36.9 C)     TempSrc: Oral     SpO2: 96% 93% 93% 95%  Weight:      Height:        Intake/Output Summary (Last 24 hours) at 10/04/2020 1040 Last data filed at 10/04/2020 1016 Gross per 24 hour  Intake 3168.48 ml  Output 3250 ml  Net -81.52 ml   Filed Weights   09/19/20 1930 09/26/20 0258 09/30/20 1646  Weight: 63.3 kg 63.3 kg 69 kg    Examination:  General exam: Appears calm and comfortable, not in any acute distress.  Has a trach collar. Respiratory system: Clear to auscultation. Respiratory effort normal. coughing on suction. Cardiovascular system: S1 & S2 heard, RRR. No JVD, murmurs, rubs, gallops or clicks. No pedal edema. Gastrointestinal system: Abdomen  is nondistended, soft and nontender. No organomegaly or masses felt.  Normal bowel sounds heard.  PEG tube : no signs of erythema or redness noted. Central nervous system: Alert and oriented. No focal neurological deficits. Extremities: Symmetric 5 x 5 power. No edema, No cyanosis. Skin: No rashes, lesions or ulcers Psychiatry: Judgement and insight appear normal. Mood & affect appropriate.     Data Reviewed: I have personally reviewed following labs and imaging studies  CBC: Recent Labs  Lab 09/29/20 0646 09/30/20 0248 10/01/20 0749 10/02/20 0704 10/03/20 0317 10/04/20 0247  WBC 18.0* 17.8* 15.9* 15.9* 12.5* 15.9*  NEUTROABS 14.2*  --   --   --   --   --   HGB 10.3* 11.0* 11.0* 10.4* 10.0* 10.5*  HCT 32.4* 35.7* 35.8* 34.9* 34.3* 36.2*  MCV 91.5 94.7 96.8 97.8 99.1 99.5  PLT 204 245 216 224 254 182   Basic Metabolic Panel: Recent Labs  Lab 09/29/20 0646 09/30/20 0248 10/01/20 0749 10/02/20 0704 10/03/20 0317 10/04/20 0247  NA 139 142 142 143  143 143 143  K 3.3* 3.8 4.0 3.8  3.7 3.6 4.2  CL 92* 93* 93*  94*  93* 94* 92*  CO2 41* 44* 42* 43*  43* 45* 45*  GLUCOSE 154* 103* 100* 99  98 116* 81  BUN 32* 30* 26* 26*  26* 27* 26*  CREATININE 0.47* 0.55* 0.49* 0.43*  0.52* 0.52*  0.52* 0.58*  CALCIUM 8.9 9.4 9.4 9.3  9.3 9.3 9.5  MG 1.9  --  2.1 1.9  --   --   PHOS 3.2 2.7 3.6 3.3  3.3 3.4 3.9   GFR: Estimated Creatinine Clearance: 81.1 mL/min (A) (by C-G formula based on SCr of 0.58 mg/dL (L)). Liver Function Tests: Recent Labs  Lab 09/30/20 0248 10/01/20 0749 10/02/20 0704 10/03/20 0317 10/04/20 0247  ALBUMIN 2.0* 2.0* 2.1* 1.9* 2.2*   No results for input(s): LIPASE, AMYLASE in the last 168 hours. No results for input(s): AMMONIA in the last 168 hours. Coagulation Profile: Recent Labs  Lab 10/04/20 1005  INR 1.0   Cardiac Enzymes: No results for input(s): CKTOTAL, CKMB, CKMBINDEX, TROPONINI in the last 168 hours. BNP (last 3 results) No results for input(s): PROBNP in the last 8760 hours. HbA1C: No results for input(s): HGBA1C in the last 72 hours. CBG: Recent Labs  Lab 10/03/20 1530 10/03/20 1925 10/03/20 2315 10/04/20 0323 10/04/20 0752  GLUCAP 140* 148* 83 95 91   Lipid Profile: No results for input(s): CHOL, HDL, LDLCALC, TRIG, CHOLHDL, LDLDIRECT in the last 72 hours. Thyroid Function Tests: No results for input(s): TSH, T4TOTAL, FREET4, T3FREE, THYROIDAB in the last 72 hours. Anemia Panel: No results for input(s): VITAMINB12, FOLATE, FERRITIN, TIBC, IRON, RETICCTPCT in the last 72 hours. Sepsis Labs: Recent Labs  Lab 09/28/20 0330  LATICACIDVEN 0.9    Recent Results (from the past 240 hour(s))  MRSA PCR Screening     Status: None   Collection Time: 09/24/20 10:47 AM  Result Value Ref Range Status   MRSA by PCR NEGATIVE NEGATIVE Final    Comment:        The GeneXpert MRSA Assay (FDA approved for NASAL specimens only), is one component of a comprehensive MRSA colonization surveillance program. It is not intended to diagnose MRSA infection  nor to guide or monitor treatment for MRSA infections. Performed at [facility], [place] [address] [place], [place] [address]     Radiology Studies: No results found.  Scheduled  Meds:  chlorhexidine gluconate (MEDLINE KIT)  15 mL Mouth Rinse BID   Chlorhexidine Gluconate Cloth  6 each Topical Q0600   cholecalciferol  10,000 Units Oral Daily   enoxaparin (LOVENOX) injection  40 mg Subcutaneous Q24H   free water  100 mL Per Tube Q6H   insulin aspart  0-15 Units Subcutaneous Q4H   insulin aspart  4 Units Subcutaneous Q4H   insulin glargine  16 Units Subcutaneous Daily   mouth rinse  15 mL Mouth Rinse 10 times per day   midodrine  5 mg Per Tube TID WC   multivitamin  15 mL Per Tube Daily   pantoprazole sodium  40 mg Per Tube Daily   sertraline  50 mg Per Tube Daily   traZODone  100 mg Per Tube QHS   Continuous Infusions:  sodium chloride     feeding supplement (VITAL AF 1.2 CAL) 1,000 mL (10/03/20 1754)   vancomycin Stopped (10/04/20 0528)     LOS: 15 days    Time spent: 25 mins    Dorian Renfro, MD [facility]   If 7PM-7AM, please contact night-coverage

## 2020-10-05 DIAGNOSIS — Z9911 Dependence on respirator [ventilator] status: Secondary | ICD-10-CM

## 2020-10-05 DIAGNOSIS — J9622 Acute and chronic respiratory failure with hypercapnia: Secondary | ICD-10-CM

## 2020-10-05 LAB — RENAL FUNCTION PANEL
Albumin: 2.4 g/dL — ABNORMAL LOW (ref 3.5–5.0)
Anion gap: 4 — ABNORMAL LOW (ref 5–15)
BUN: 27 mg/dL — ABNORMAL HIGH (ref 8–23)
CO2: 46 mmol/L — ABNORMAL HIGH (ref 22–32)
Calcium: 10.1 mg/dL (ref 8.9–10.3)
Chloride: 92 mmol/L — ABNORMAL LOW (ref 98–111)
Creatinine, Ser: 0.57 mg/dL — ABNORMAL LOW (ref 0.61–1.24)
GFR, Estimated: >60 mL/min (ref >60)
Glucose, Bld: 116 mg/dL — ABNORMAL HIGH (ref 70–99)
Phosphorus: 5.3 mg/dL — ABNORMAL HIGH (ref 2.5–4.6)
Potassium: 4 mmol/L (ref 3.5–5.1)
Sodium: 142 mmol/L (ref 135–145)

## 2020-10-05 LAB — GLUCOSE, CAPILLARY
Glucose-Capillary: 107 mg/dL — ABNORMAL HIGH (ref 70–99)
Glucose-Capillary: 113 mg/dL — ABNORMAL HIGH (ref 70–99)
Glucose-Capillary: 123 mg/dL — ABNORMAL HIGH (ref 70–99)
Glucose-Capillary: 126 mg/dL — ABNORMAL HIGH (ref 70–99)
Glucose-Capillary: 77 mg/dL (ref 70–99)
Glucose-Capillary: 98 mg/dL (ref 70–99)

## 2020-10-05 NOTE — Progress Notes (Signed)
NAME:  Steve [name], MRN:  [id number], DOB:  Dec 05, 1955, LOS: 47 ADMISSION DATE:  09/19/2020, CONSULTATION DATE:  09/24/20 REFERRING MD:  Dr Nolberto Hanlon, CHIEF COMPLAINT:  Resp failure   BRIEF   65 year old male who is well kwn to PCCM service from springs-summer 2021 for s/p covid, respiratoy failure, ARDS, pneumothorax, prolonged mechanical ventilation (? 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  At baseline.   Port-A-Cath placement in December 2020 and gastrostomy tube placement in May 2021.  Of note , has Hodgkins Lymphoma  with recuirrence March 2022 (Bulky intrathoracic adenopathy esp new substernal and newosseous metastatic disease, PET 06/19/2020.). As of April 2022 got 2nd dose Adcetris.  The admitted May 2022 for "pneumonia" and Acetris help (in part due to high LFT) and all Rx held.  Also as a preadmission problem: Chronic caxhexia, protein calorie malnutrition - moderate, failure thrive, DM all as his baseline prior to admission problems/ his baseline ECOG May 2022 was 2  Admitted 09/19/20 (recent baseline 8LO2 via trach). With worsening cough, lethargy.  Blood culture came back positive with Staphylococcus epidermis on 09/22/20 (all 4 of 4 cutuirs), infectious disease and s/p Port-A-Cath removal 09/22/20. As a present on admission problem O 09/22/20 - was needed 10L O2. CT scan this admit 09/20/2020 shwoed worrsening lymphadenopathy in chest concerning for Hodgkins progression. On 6/15 - Seen by Dr Marin Olp and recommended ICE protocol when better (highly immune suppressive regimen). ID had recommended 2 week Rx  . On 6/16 - reports he is more hypoxemic needeing >90% fio2 via Trach collar though no increased work of breathing. CCM consulted  Remains full code despite multiple conversations  Pertinent  Medical History  Covid admission March - May 2021  Significant Hospital  Events: Including procedures, antibiotic start and stop dates in addition to other pertinent events   6/16 - ccm consult  6/17 -CT angiogram ruled out pulmonary embolism.  He has progressive Hodgkin's disease on the CT scan of the chest.  He was unable to sustain facemask oxygen and has required being placed on pressure support ventilation through the tracheostomy.  He now has a cuffed 6 sized tracheostomy.  Currently on 50% oxygen and saturating well and more comfortable. 6/18 -stuck on pressure support ventilator at 40% FiO2.  Unable to wean off.  He is watching TV and is able to eat 6/22 slowly progressing PSV wean 6/24 tolerating ATC 6/25 tolerated ATC for about 1 and half hours 6/26 very short tolerance of ATC, back on vent quickly due to dyspnea  Interim History / Subjective:  Denies complaints.  Objective   Blood pressure (!) 100/57, pulse 82, temperature 98.2 F (36.8 C), temperature source Oral, resp. rate (!) 31, height 5\' 5"  (1.651 m), weight 69 kg, SpO2 98 %.    Vent Mode: PSV;CPAP FiO2 (%):  [45 %-80 %] 45 % PEEP:  [5 cmH20] 5 cmH20 Pressure Support:  [8 cmH20] 8 cmH20 Plateau Pressure:  [17 cmH20-20 cmH20] 20 cmH20   Intake/Output Summary (Last 24 hours) at 10/05/2020 0843 Last data filed at 10/05/2020 0800 Gross per 24 hour  Intake 3015.52 ml  Output 3935 ml  Net -919.48 ml    Filed Weights   09/19/20 1930 09/26/20 0258 09/30/20 1646  Weight: 63.3 kg 63.3 kg 69 kg    Examination: Gen: chronically ill appearing man lying in bed in NAD, sleeping HEENT: [place]/AT, eyes anicteric  Neck: Trach in place, no secretions or bleeding Lungs: rhales L>R, mild tachypnea, no accessory muscle use. On TC. CV: S!S2, reg rate, irreg rhythm- NSR with PACS on tele Abd: soft, NT. PEG in place Ext: no significant LE edema, no cyanosis or clubbing Neuro: sleeping, arouses to verbal stimulation, globally weak. Comprehension appears intact, nodding to answer questions.  Labs/imaging  that I havepersonally reviewed  (right click and "Reselect all SmartList Selections" daily)  Bicarb 46 BUN 27 Cr 0.57 WBC 15.9 No recent cultures  LABS    PULMONARY No results for input(s): PHART, PCO2ART, PO2ART, HCO3, TCO2, O2SAT in the last 168 hours.  Invalid input(s): PCO2, PO2   CBC Recent Labs  Lab 10/02/20 0704 10/03/20 0317 10/04/20 0247  HGB 10.4* 10.0* 10.5*  HCT 34.9* 34.3* 36.2*  WBC 15.9* 12.5* 15.9*  PLT 224 254 248     COAGULATION Recent Labs  Lab 10/04/20 1005  INR 1.0     CARDIAC  No results for input(s): TROPONINI in the last 168 hours. No results for input(s): PROBNP in the last 168 hours.   CHEMISTRY Recent Labs  Lab 09/29/20 0646 09/30/20 0248 10/01/20 0749 10/02/20 0704 10/03/20 0317 10/04/20 0247 10/04/20 1005 10/05/20 0251  NA 139   < > 142 143  143 143 143 142 142  K 3.3*   < > 4.0 3.8  3.7 3.6 4.2 4.8 4.0  CL 92*   < > 93* 94*  93* 94* 92* 89* 92*  CO2 41*   < > 42* 43*  43* 45* 45* >50* 46*  GLUCOSE 154*   < > 100* 99  98 116* 81 142* 116*  BUN 32*   < > 26* 26*  26* 27* 26* 25* 27*  CREATININE 0.47*   < > 0.49* 0.43*  0.52* 0.52*  0.52* 0.58* 0.63 0.57*  CALCIUM 8.9   < > 9.4 9.3  9.3 9.3 9.5 9.7 10.1  MG 1.9  --  2.1 1.9  --   --   --   --   PHOS 3.2   < > 3.6 3.3  3.3 3.4 3.9  --  5.3*   < > = values in this interval not displayed.    Estimated Creatinine Clearance: 81.1 mL/min (A) (by C-G formula based on SCr of 0.57 mg/dL (L)).  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 Severe baseline ILD to have CO2 retention. Significant tracheobronchomalacia on CT  -Con't efforts at vent weaning. Still tolerating ATC poorly. -Checking  BNP tomorrow  Chronic metabolic alkalosis Bicarb more stable today after diamox -monitor, may need more diamox this week.  Deconditioning -PT, OT (last seen 6/23). Calling to determine if he would benefit from ongoing therapy since his admission is prolonged rather than waiting for [facility]. -planning to [facility]  Sepsis due to MRSE bacteremia -resolved -Appreciate infectious disease input -Remains on vancomycin> monitoring off antibiotics -Off steroids  Relapsed Hodgkin's lymphoma Bulky mediastinal adenopathy on CT on 6/17 -not currently a candidate for treatment   Best practice (right click and "Reselect all SmartList Selections" daily)  Per primary   Julian Hy, DO 10/05/20 9:54 AM [facility]

## 2020-10-05 NOTE — Progress Notes (Signed)
ID Brief Note  Given high grade MRSE bacteremia on presentation with very poor quality TTE, TEE is indicated to r/o endocarditis. Or else, would err in the side of empirically treating him for endocarditis with vancomycin for 6 weeks from date of negative blood cultures on 09/22/20. Of note, he also has a h/o Rt knee arthroplasty in 2014.   ID will sign off. Please call with questions  Rosiland Oz, MD Infectious Disease Physician [facility] [address] [place], [place] [address] Phone: [phone]  Fax: [phone]

## 2020-10-05 NOTE — TOC Progression Note (Signed)
Transition of Care [facility]) - Progression Note    Patient Details  Name: Steve [name] MRN: [id number] Date of Birth: 1955/11/29  Transition of Care [facility]) CM/SW Contact  Leeroy Cha, RN Phone Number: 10/05/2020, 8:57 AM  Clinical Narrative:    Significant Hospital Events: Including procedures, antibiotic start and stop dates in addition to other pertinent events  6/16 - ccm consult   6/17 -CT angiogram ruled out pulmonary embolism.  He has progressive Hodgkin's disease on the CT scan of the chest.  He was unable to sustain facemask oxygen and has required being placed on pressure support ventilation through the tracheostomy.  He now has a cuffed 6 sized tracheostomy.  Currently on 50% oxygen and saturating well and more comfortable. 6/18 -stuck on pressure support ventilator at 40% FiO2.  Unable to wean off.  He is watching TV and is able to eat 6/22 slowly progressing PSV wean 6/24 tolerating ATM 6/25 tolerated ATM for about 1 and half hours   Interim History / Subjective:  No significant overnight events No respiratory complaints at present On vent On trach collar am of [id number]/ TOC PLAN OF CARE:  Following for toc needs and progression of care.  Patient is improving and is off the vent at this time.  Expected Discharge Plan: [place] Barriers to Discharge: Continued Medical Work up  Expected Discharge Plan and Services Expected Discharge Plan: [place] In-house Referral: Clinical Social Work Discharge Planning Services: CM Consult Post Acute Care Choice: [place] arrangements for the past 2 months: Mobile Home                 DME Arranged: N/A DME Agency: NA       HH Arranged: PT [place]: [place] ([place]) Date [place]: 09/22/20   Representative spoke with at [place]: [place] (SDOH) Interventions    Readmission Risk Interventions Readmission  Risk Prevention Plan 09/22/2020 08/24/2020 07/22/2019  Transportation Screening Complete Complete Complete  Medication Review Press photographer) Complete Complete Complete  HRI or Home Care Consult Complete Complete -  SW Recovery Care/Counseling Consult Complete Complete -  Palliative Care Screening Not Applicable Not Applicable -  [place] Not Applicable Not Applicable -  Some recent data might be hidden

## 2020-10-05 NOTE — Progress Notes (Signed)
Pharmacy Antibiotic Note  Steve [name] is a 65 y.o. male admitted on 09/19/2020 with possible pneumonia, found to have staph epidermidis bacteremia. Pharmacy has been consulted for Vancomycin dosing. 6/21 Last calculated AUC = 456 (goal 400-550) Today, 10/05/2020:  SCr remains low/stable at 0.57, WBC remains elevated @ 15.9 on 6/26  (steroids d/c on 6/24).  TEE delayed.    Plan: Vancomycin 750mg  q12h. To end 6/28 at midnight    Height: 5\' 5"  (165.1 cm) Weight: 69 kg (152 lb 1.9 oz) IBW/kg (Calculated) : 61.5  Temp (24hrs), Avg:98.1 F (36.7 C), Min:97.6 F (36.4 C), Max:98.5 F (36.9 C)  Recent Labs  Lab 09/28/20 1915 09/29/20 0256 09/29/20 0646 09/30/20 0248 10/01/20 0749 10/02/20 0704 10/03/20 0317 10/04/20 0247 10/04/20 1005 10/05/20 0251  WBC  --   --    < > 17.8* 15.9* 15.9* 12.5* 15.9*  --   --   CREATININE  --   --    < > 0.55* 0.49* 0.43*  0.52* 0.52*  0.52* 0.58* 0.63 0.57*  VANCOTROUGH  --  12*  --   --   --   --   --   --   --   --   VANCOPEAK 24*  --   --   --   --   --   --   --   --   --    < > = values in this interval not displayed.     Estimated Creatinine Clearance: 81.1 mL/min (A) (by C-G formula based on SCr of 0.57 mg/dL (L)).    No Known Allergies  Antimicrobials this admission: 6/11 Vancomycin >> 6/12, resumed 6/12 >> 6/11 Cefepime >> 6/15  Dose adjustments this admission: 6/14 0536 VP: 37, 1530 VT: 10 6/20-6/21 levels Vancomycin peak = 24 Vancomycin trough = 12 Calculated AUC = 456 (goal 400-550) 6/21  Scr stable, peak drawn ~1 late and trough ~30 mins early but levels adjusted for calculation. Patient specific kinetics ke: 0.0902, T1/2 7.7 hours, Vd 36.4L  Microbiology results: 6/11 BCx: 4/4 bottles Staph epi (methicillin resistance detected)  6/11 UCx: multiple species - final Sputum:  ordered, not collected 6/11 Strep pneumo urinary antigen: neg 6/11 Legionella urinary antigen: neg 6/12 MRSA PCR: neg 6/14 BCx: NGTD  6/16  MRSA PCR: neg  Thank you for allowing pharmacy to be a part of this patient's care.  Eudelia Bunch, Pharm.D 10/05/2020 10:20 AM

## 2020-10-05 NOTE — Progress Notes (Signed)
Patient tolerating well and saturation maintaining at 99%. FIO2 decreased to 70%. Patient tolerating well at this time with no distress. Saturation 95% hr82 [id number]

## 2020-10-05 NOTE — Progress Notes (Signed)
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 0n 09/24/20, patient has developed respiratory distress with increased oxygen requirement and was transferred to ICU and started on pressure support ventilation. Palliative medicine and infectious disease following.  ID recommended TEE to rule out infective endocarditis and decide about need for PICC line for IV antibiotics.  Cardiology thinks the patient is not stable to have TEE at this time.  Assessment & Plan:   Principal Problem:   Failure to thrive in adult Active Problems:   Pressure ulcer   Tracheostomy dependence ([name])   Chronic respiratory failure with hypoxia ([name])   Hodgkin's lymphoma ([place])   Essential hypertension   Bacteremia  Sepsis due to pneumonia /staph epidermis bacteremia: POA.  Patient presented with tachycardia, tachypnea, leukocytosis on presentation . CXR showed possible recurrent pneumonia but difficult to interpret.   CT chest showed interval increase in bulk of mediastinal and axillary adenopathy.   Cultures grew staph epidermidis.  Port a cath removed on 6/16. Patient remained on cefepime 6/11-6/15. ID recommends IV Vanco for minimum of 2 weeks from 6/14 (first negative cultures) if TEE negative for endocarditis. ID recommended TEE to exclude endocarditis, It was discussed w/ cardiology, He was scheduled for TEE 6/24,  but later cardiology thinks patient is not stable to have TEE.  Consider TEE when more  stable. Continue midodrine for hypotension. Stress dose steroids taperd off 6/24. Appreciate PCCM help with co management. Will discuss with ID about the duration of antibiotics if unable to do TEE.   Hypotension likely arterial insufficiency from chronic steroid in the setting of sepsis. Continue midodrine , stress dose steroids tapered off.   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    CXR and CT chest as above. -On PS MV since 6/16.  PCCM managing.   Relapsed Hodgkin's lymphoma:  CT chest concerning for worsening LAD and progression. Oncology following. He will need central venous access at some point to resume chemotherapy.   DM-2 with hyperglycemia : Continue SSI to moderate every 4 hours. Continue NovoLog  2 to 4 units every 4 hours Reduce Lantus to 16 units as he had hypoglycemic episode,  after his steroid dose tapered off. Further adjustment as appropriate.   Normocytic anemia/anemia of chronic disease:  Continue to monitor. Hb stable.  Hyponatremia: Resolved.   Hypokalemia Resolved. Continue to moniter   Metabolic alkalosis:  Could be due to CO2 retention  He is on mechanical ventilation. Continue Diamox as PCCM.  Leukocytosis/bandemia: Likely due to steroid versus sepsis. Continue to monitor, Improving   Severe protein calorie malnutrition: POA. Tolerating some by mouth, also receiving tube feeds while mechanically ventilated   Body mass index is 23.22 kg/m. Nutrition Problem: Severe Malnutrition Etiology: chronic illness, cancer and cancer related treatments Signs/Symptoms: severe fat depletion, severe muscle depletion Interventions: Tube feeding, Prostat, MVI   DVT prophylaxis: Lovenox. Code Status: Full code. Family Communication: No family at bed side.  Disposition Plan:    Status is: Inpatient  Remains inpatient appropriate  because:Inpatient level of care appropriate due to severity of illness  Dispo: The patient is from: Home              Anticipated d/c is to: SNF              Patient currently is not medically stable to d/c.   Difficult to place patient No  Consultants:  ID PCCM Cardiology  Procedures:  TEE on 6/24. Antimicrobials:   Anti-infectives (From admission, onward)    Start     Dose/Rate Route Frequency Ordered Stop   09/23/20 0800  ceFEPIme (MAXIPIME) 2 g in sodium chloride 0.9 % 100 mL IVPB  Status:  Discontinued        2 g 200 mL/hr over 30 Minutes Intravenous Every 8 hours 09/23/20 0738 09/23/20 1758   09/21/20 0400  vancomycin (VANCOREADY) IVPB 750 mg/150 mL        750 mg 150 mL/hr over 60 Minutes Intravenous Every 12 hours 09/20/20 1715 10/06/20 2359   09/20/20 1730  vancomycin (VANCOREADY) IVPB 750 mg/150 mL        750 mg 150 mL/hr over 60 Minutes Intravenous STAT 09/20/20 1714 09/20/20 1954   09/20/20 0400  vancomycin (VANCOREADY) IVPB 750 mg/150 mL  Status:  Discontinued        750 mg 150 mL/hr over 60 Minutes Intravenous Every 12 hours 09/19/20 1932 09/20/20 1155   09/20/20 0000  ceFEPIme (MAXIPIME) 2 g in sodium chloride 0.9 % 100 mL IVPB  Status:  Discontinued        2 g 200 mL/hr over 30 Minutes Intravenous Every 8 hours 09/19/20 1932 09/23/20 0721   09/19/20 1600  ceFEPIme (MAXIPIME) 2 g in sodium chloride 0.9 % 100 mL IVPB        2 g 200 mL/hr over 30 Minutes Intravenous  Once 09/19/20 1548 09/19/20 1634   09/19/20 1600  vancomycin (VANCOREADY) IVPB 1250 mg/250 mL        1,250 mg 166.7 mL/hr over 90 Minutes Intravenous STAT 09/19/20 1557 09/19/20 1822        Subjective: Patient was seen and examined at bedside. Overnight events noted.   He reports feeling better,  denies any chest pain. He is on trach collar and has a PEG tube.  He denies any other concerns,   Objective: Vitals:   10/05/20 0900 10/05/20 1000 10/05/20 1100 10/05/20 1120  BP: (!) 97/49 (!)  105/45 (!) 96/44   Pulse: 67 72 72   Resp: (!) 5 (!) 26 (!) 30   Temp:      TempSrc:      SpO2: 100% 100% 97% 97%  Weight:      Height:        Intake/Output Summary (Last 24 hours) at 10/05/2020 1143 Last data filed at 10/05/2020 1043 Gross per 24 hour  Intake 3015.52 ml  Output 3435 ml  Net -419.48 ml   Filed Weights   09/19/20 1930 09/26/20 0258 09/30/20 1646  Weight: 63.3 kg 63.3 kg 69 kg    Examination:  General exam: Appears calm and comfortable, not in any acute distress.  Has a trach collar. Appears pleasant  Respiratory system: Clear to auscultation. Respiratory effort normal. Cardiovascular system: S1 & S2 heard, RRR. No JVD, murmurs, rubs, gallops or clicks. No pedal edema. Gastrointestinal system: Abdomen is nondistended, soft and nontender. No organomegaly or masses felt.  Normal bowel sounds heard.  PEG  tube : no signs of erythema or redness noted. Central nervous system: Alert and oriented. No focal neurological deficits. Extremities: Symmetric 5 x 5 power. No edema, No cyanosis. Skin: No rashes, lesions or ulcers Psychiatry: Judgement and insight appear normal. Mood & affect appropriate.     Data Reviewed: I have personally reviewed following labs and imaging studies  CBC: Recent Labs  Lab 09/29/20 0646 09/30/20 0248 10/01/20 0749 10/02/20 0704 10/03/20 0317 10/04/20 0247  WBC 18.0* 17.8* 15.9* 15.9* 12.5* 15.9*  NEUTROABS 14.2*  --   --   --   --   --   HGB 10.3* 11.0* 11.0* 10.4* 10.0* 10.5*  HCT 32.4* 35.7* 35.8* 34.9* 34.3* 36.2*  MCV 91.5 94.7 96.8 97.8 99.1 99.5  PLT 204 245 216 224 254 163   Basic Metabolic Panel: Recent Labs  Lab 09/29/20 0646 09/30/20 0248 10/01/20 0749 10/02/20 0704 10/03/20 0317 10/04/20 0247 10/04/20 1005 10/05/20 0251  NA 139   < > 142 143  143 143 143 142 142  K 3.3*   < > 4.0 3.8  3.7 3.6 4.2 4.8 4.0  CL 92*   < > 93* 94*  93* 94* 92* 89* 92*  CO2 41*   < > 42* 43*  43* 45* 45* >50* 46*  GLUCOSE 154*    < > 100* 99  98 116* 81 142* 116*  BUN 32*   < > 26* 26*  26* 27* 26* 25* 27*  CREATININE 0.47*   < > 0.49* 0.43*  0.52* 0.52*  0.52* 0.58* 0.63 0.57*  CALCIUM 8.9   < > 9.4 9.3  9.3 9.3 9.5 9.7 10.1  MG 1.9  --  2.1 1.9  --   --   --   --   PHOS 3.2   < > 3.6 3.3  3.3 3.4 3.9  --  5.3*   < > = values in this interval not displayed.   GFR: Estimated Creatinine Clearance: 81.1 mL/min (A) (by C-G formula based on SCr of 0.57 mg/dL (L)). Liver Function Tests: Recent Labs  Lab 10/01/20 0749 10/02/20 0704 10/03/20 0317 10/04/20 0247 10/05/20 0251  ALBUMIN 2.0* 2.1* 1.9* 2.2* 2.4*   No results for input(s): LIPASE, AMYLASE in the last 168 hours. No results for input(s): AMMONIA in the last 168 hours. Coagulation Profile: Recent Labs  Lab 10/04/20 1005  INR 1.0   Cardiac Enzymes: No results for input(s): CKTOTAL, CKMB, CKMBINDEX, TROPONINI in the last 168 hours. BNP (last 3 results) No results for input(s): PROBNP in the last 8760 hours. HbA1C: No results for input(s): HGBA1C in the last 72 hours. CBG: Recent Labs  Lab 10/04/20 1505 10/04/20 1944 10/04/20 2306 10/05/20 0358 10/05/20 0735  GLUCAP 167* 89 84 113* 126*   Lipid Profile: No results for input(s): CHOL, HDL, LDLCALC, TRIG, CHOLHDL, LDLDIRECT in the last 72 hours. Thyroid Function Tests: No results for input(s): TSH, T4TOTAL, FREET4, T3FREE, THYROIDAB in the last 72 hours. Anemia Panel: No results for input(s): VITAMINB12, FOLATE, FERRITIN, TIBC, IRON, RETICCTPCT in the last 72 hours. Sepsis Labs: No results for input(s): PROCALCITON, LATICACIDVEN in the last 168 hours.   No results found for this or any previous visit (from the past 240 hour(s)).   Radiology Studies: No results found.  Scheduled Meds:  chlorhexidine gluconate (MEDLINE KIT)  15 mL Mouth Rinse BID   Chlorhexidine Gluconate Cloth  6 each Topical Q0600   cholecalciferol  10,000 Units Oral Daily   enoxaparin (LOVENOX) injection  40  mg Subcutaneous Q24H   free water  100 mL Per Tube Q6H   insulin aspart  0-15 Units Subcutaneous Q4H   insulin aspart  4 Units Subcutaneous Q4H   insulin glargine  16 Units Subcutaneous Daily   mouth rinse  15 mL Mouth Rinse 10 times per day   midodrine  5 mg Per Tube TID WC   multivitamin  15 mL Per Tube Daily   pantoprazole sodium  40 mg Per Tube Daily   sertraline  50 mg Per Tube Daily   traZODone  100 mg Per Tube QHS   Continuous Infusions:  sodium chloride 20 mL/hr at 10/04/20 1343   feeding supplement (VITAL AF 1.2 CAL) 65 mL/hr at 10/05/20 0700   vancomycin Stopped (10/05/20 0521)     LOS: 16 days    Time spent: 25 mins  Alexande Sheerin, MD [facility]   If 7PM-7AM, please contact night-coverage

## 2020-10-05 NOTE — Progress Notes (Signed)
Patient suctioned, changed inner cannula,and drain sponges and placed on 80% trach collar. Patient is tolerating well at this time with no distress. Sat 99%

## 2020-10-06 ENCOUNTER — Inpatient Hospital Stay: Payer: Self-pay

## 2020-10-06 DIAGNOSIS — R627 Adult failure to thrive: Secondary | ICD-10-CM | POA: Diagnosis not present

## 2020-10-06 LAB — RENAL FUNCTION PANEL
Albumin: 2.6 g/dL — ABNORMAL LOW (ref 3.5–5.0)
Anion gap: 5 (ref 5–15)
BUN: 27 mg/dL — ABNORMAL HIGH (ref 8–23)
CO2: 45 mmol/L — ABNORMAL HIGH (ref 22–32)
Calcium: 10.5 mg/dL — ABNORMAL HIGH (ref 8.9–10.3)
Chloride: 92 mmol/L — ABNORMAL LOW (ref 98–111)
Creatinine, Ser: 0.52 mg/dL — ABNORMAL LOW (ref 0.61–1.24)
GFR, Estimated: >60 mL/min (ref >60)
Glucose, Bld: 86 mg/dL (ref 70–99)
Phosphorus: 4.2 mg/dL (ref 2.5–4.6)
Potassium: 4.7 mmol/L (ref 3.5–5.1)
Sodium: 142 mmol/L (ref 135–145)

## 2020-10-06 LAB — GLUCOSE, CAPILLARY
Glucose-Capillary: 107 mg/dL — ABNORMAL HIGH (ref 70–99)
Glucose-Capillary: 146 mg/dL — ABNORMAL HIGH (ref 70–99)
Glucose-Capillary: 143 mg/dL — ABNORMAL HIGH (ref 70–99)
Glucose-Capillary: 104 mg/dL — ABNORMAL HIGH (ref 70–99)
Glucose-Capillary: 121 mg/dL — ABNORMAL HIGH (ref 70–99)
Glucose-Capillary: 94 mg/dL (ref 70–99)

## 2020-10-06 LAB — CBC
HCT: 39.1 % (ref 39.0–52.0)
Hemoglobin: 11.2 g/dL — ABNORMAL LOW (ref 13.0–17.0)
MCH: 28.6 pg (ref 26.0–34.0)
MCHC: 28.6 g/dL — ABNORMAL LOW (ref 30.0–36.0)
MCV: 100 fL (ref 80.0–100.0)
Platelets: 223 10*3/uL (ref 150–400)
RBC: 3.91 MIL/uL — ABNORMAL LOW (ref 4.22–5.81)
RDW: 16.7 % — ABNORMAL HIGH (ref 11.5–15.5)
WBC: 13.1 10*3/uL — ABNORMAL HIGH (ref 4.0–10.5)
nRBC: 0 % (ref 0.0–0.2)

## 2020-10-06 LAB — BASIC METABOLIC PANEL
Anion gap: 8 (ref 5–15)
BUN: 27 mg/dL — ABNORMAL HIGH (ref 8–23)
CO2: 45 mmol/L — ABNORMAL HIGH (ref 22–32)
Calcium: 10.5 mg/dL — ABNORMAL HIGH (ref 8.9–10.3)
Chloride: 91 mmol/L — ABNORMAL LOW (ref 98–111)
Creatinine, Ser: 0.59 mg/dL — ABNORMAL LOW (ref 0.61–1.24)
GFR, Estimated: >60 mL/min (ref >60)
Glucose, Bld: 85 mg/dL (ref 70–99)
Potassium: 4.8 mmol/L (ref 3.5–5.1)
Sodium: 144 mmol/L (ref 135–145)

## 2020-10-06 LAB — PHOSPHORUS: Phosphorus: 4.2 mg/dL (ref 2.5–4.6)

## 2020-10-06 LAB — MAGNESIUM: Magnesium: 2.4 mg/dL (ref 1.7–2.4)

## 2020-10-06 LAB — BRAIN NATRIURETIC PEPTIDE: B Natriuretic Peptide: 39.6 pg/mL (ref 0.0–100.0)

## 2020-10-06 MED ORDER — SODIUM CHLORIDE 0.9% FLUSH: 10.0000 mL | INTRAVENOUS | Status: DC | PRN

## 2020-10-06 MED ORDER — SODIUM CHLORIDE 0.9% FLUSH
10.0000 mL | Freq: Two times a day (BID) | INTRAVENOUS | Status: DC
  Administered 2020-10-06 – 2020-10-12 (×12): 10 mL
  Administered 2020-10-12: 20 mL
  Administered 2020-10-13: 10 mL
  Administered 2020-10-13 – 2020-10-14 (×2): 20 mL
  Administered 2020-10-14 – 2020-10-16 (×4): 10 mL
  Administered 2020-10-16: 20 mL
  Administered 2020-10-17: 10 mL
  Administered 2020-10-17: 20 mL
  Administered 2020-10-18 – 2020-10-19 (×3): 10 mL
  Administered 2020-10-19: 20 mL
  Administered 2020-10-20: 10 mL
  Administered 2020-10-20: 20 mL
  Administered 2020-10-21: 10 mL
  Administered 2020-10-21 – 2020-10-22 (×2): 20 mL
  Administered 2020-10-22 – 2020-10-24 (×4): 10 mL
  Administered 2020-10-24: 20 mL
  Administered 2020-10-25 – 2020-10-26 (×4): 10 mL

## 2020-10-06 NOTE — Progress Notes (Signed)
Peripherally Inserted Central Catheter Placement  The IV Nurse has discussed with the patient and/or persons authorized to consent for the patient, the purpose of this procedure and the potential benefits and risks involved with this procedure.  The benefits include less needle sticks, lab draws from the catheter, and the patient may be discharged home with the catheter. Risks include, but not limited to, infection, bleeding, blood clot (thrombus formation), and puncture of an artery; nerve damage and irregular heartbeat and possibility to perform a PICC exchange if needed/ordered by physician.  Alternatives to this procedure were also discussed.  Bard Power PICC patient education guide, fact sheet on infection prevention and patient information card has been provided to patient /or left at bedside.    PICC Placement Documentation  PICC Single Lumen 17/71/16 Right Basilic 40 cm 1 cm (Active)  Indication for Insertion or Continuance of Line Prolonged intravenous therapies 10/06/20 1800  Exposed Catheter (cm) 1 cm 10/06/20 1800  Site Assessment Clean;Dry;Intact 10/06/20 1800  Line Status Flushed;Saline locked;Blood return noted 10/06/20 1800  Dressing Type Transparent;Securing device 10/06/20 1800  Dressing Status Clean;Dry;Intact 10/06/20 1800  Antimicrobial disc in place? Yes 10/06/20 1800  Safety Lock Not Applicable 57/90/38 3338  Line Care Connections checked and tightened 10/06/20 1800  Dressing Intervention New dressing 10/06/20 1800  Dressing Change Due 10/13/20 10/06/20 1800       Darlyn Read 10/06/2020, 6:06 PM

## 2020-10-06 NOTE — Progress Notes (Signed)
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  First negative blood cultures 09/22/20.  0n 09/24/20, patient has developed respiratory distress with increased oxygen requirement and was transferred to ICU and started on pressure support ventilation. Palliative medicine and infectious disease consulted,  ID recommended TEE to rule out infective endocarditis,  Cardiology  stated patient is not stable to have TEE at that time. ID recommended 6 weeks of IV antibiotics from first negative blood cultures on 09/22/20 if unable to have TEE.  Cardiology reconsulted to reassess if patient is stable for TEE.  Assessment & Plan:   Principal Problem:   Failure to thrive in adult Active Problems:   Pressure ulcer   Tracheostomy dependence ([name])   Chronic respiratory failure with hypoxia ([name])   Hodgkin's lymphoma ([place])   Essential hypertension   Bacteremia  Sepsis due to pneumonia /staph epidermis bacteremia: POA.  Patient presented with tachycardia, tachypnea, leukocytosis on presentation . CXR showed possible recurrent pneumonia but difficult to interpret.   CT chest showed interval increase in bulk of mediastinal and axillary adenopathy.   Cultures grew staph epidermidis.  Port a cath removed on 6/16. Patient remained on cefepime from 6/11-6/15. ID recommended TEE to exclude endocarditis, It was discussed w/ cardiology, He was scheduled for TEE 6/24, but later cancelled, Cardio states patient is not  stable to have TEE.  Consider TEE when more stable. Continue midodrine for hypotension. Stress dose steroids taperd off on 6/24. Appreciate PCCM help with co management. ID recommends Vanco for minimum of 2 weeks from 6/14 (first negative cultures) if TEE negative for endocarditis,  6 weeks if (+) or not able to have TEE. Cardiology requested to reassess if patient is able to have TEE.   Hypotension: Continue midodrine , stress dose steroids tapered off.   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    -On PS MV since 6/16.  PCCM managing.   Relapsed Hodgkin's lymphoma:  CT chest concerning for worsening LAD and progression. Oncology following. He will need central venous access at some point to resume chemotherapy.   DM-2 with hyperglycemia : Continue SSI to moderate every 4 hours. Continue Lantus to 16 units daily Further adjustment as appropriate.   Normocytic anemia/anemia of chronic disease:  Continue to monitor. Hb stable.  Hyponatremia: Resolved.   Hypokalemia Resolved. Continue to moniter   Metabolic alkalosis:  Could be due to CO2 retention  He is on mechanical ventilation. Continue Diamox as PCCM.  Leukocytosis/bandemia: Likely due to steroid versus sepsis. Continue to monitor, Improving   Severe protein calorie malnutrition: POA. Tolerating some by mouth, also receiving tube feeds while mechanically ventilated Body mass index is 23.22 kg/m. Nutrition Problem: Severe Malnutrition Etiology: chronic illness, cancer and cancer related treatments Signs/Symptoms: severe fat depletion, severe muscle depletion Interventions: Tube feeding, Prostat, MVI   DVT prophylaxis: Lovenox. Code Status: Full code. Family Communication: No family at bed side. Disposition Plan:  Status is: Inpatient  Remains inpatient appropriate because:Inpatient level of  care appropriate due to severity of illness  Dispo: The patient is from: Home              Anticipated d/c is to: SNF              Patient currently is not medically stable to d/c.   Difficult to place patient No  Consultants:  ID PCCM Cardiology  Procedures:  TEE on 6/24 cancelled. Antimicrobials:   Anti-infectives (From admission, onward)    Start     Dose/Rate Route Frequency Ordered Stop   09/23/20 0800  ceFEPIme (MAXIPIME) 2 g in sodium chloride 0.9 % 100 mL IVPB  Status:  Discontinued        2 g 200 mL/hr over 30 Minutes Intravenous Every 8 hours 09/23/20 0738 09/23/20 1758   09/21/20 0400  vancomycin (VANCOREADY) IVPB 750 mg/150 mL        750 mg 150 mL/hr over 60 Minutes Intravenous Every 12 hours 09/20/20 1715 11/03/20 2359   09/20/20 1730  vancomycin (VANCOREADY) IVPB 750 mg/150 mL        750 mg 150 mL/hr over 60 Minutes Intravenous STAT 09/20/20 1714 09/20/20 1954   09/20/20 0400  vancomycin (VANCOREADY) IVPB 750 mg/150 mL  Status:  Discontinued        750 mg 150 mL/hr over 60 Minutes Intravenous Every 12 hours 09/19/20 1932 09/20/20 1155   09/20/20 0000  ceFEPIme (MAXIPIME) 2 g in sodium chloride 0.9 % 100 mL IVPB  Status:  Discontinued        2 g 200 mL/hr over 30 Minutes Intravenous Every 8 hours 09/19/20 1932 09/23/20 0721   09/19/20 1600  ceFEPIme (MAXIPIME) 2 g in sodium chloride 0.9 % 100 mL IVPB        2 g 200 mL/hr over 30 Minutes Intravenous  Once 09/19/20 1548 09/19/20 1634   09/19/20 1600  vancomycin (VANCOREADY) IVPB 1250 mg/250 mL        1,250 mg 166.7 mL/hr over 90 Minutes Intravenous STAT 09/19/20 1557 09/19/20 1822        Subjective: Patient was seen and examined at bedside. Overnight events noted.   He is on trach collar and has a PEG tube. He reports feeling better. He denies any other concerns, denies any chest pain.  Objective: Vitals:   10/06/20 0800 10/06/20 0900 10/06/20 1000 10/06/20 1100  BP: 127/61 (!) 105/55 (!) 101/50 113/64   Pulse: 86 77 79 82  Resp: (!) 31 (!) 32 (!) 29 (!) 27  Temp: (!) 97.5 F (36.4 C)     TempSrc: Axillary     SpO2: 93% 96% 93% 97%  Weight:      Height:        Intake/Output Summary (Last 24 hours) at 10/06/2020 1110 Last data filed at 10/06/2020 1058 Gross per 24 hour  Intake 3186.63 ml  Output 3170 ml  Net 16.63 ml   Filed Weights   09/19/20 1930 09/26/20 0258 09/30/20 1646  Weight: 63.3 kg 63.3 kg 69 kg    Examination:  General exam: Appears calm and comfortable, not in any acute distress. Has a trach collar.  Respiratory system: Clear to auscultation. Respiratory effort normal. Cardiovascular system: S1 & S2 heard, RRR. No JVD, murmurs, rubs, gallops or clicks. No pedal edema. Gastrointestinal system: Abdomen is nondistended, soft and nontender. No organomegaly or masses felt.  Normal bowel sounds heard.  PEG tube : no signs of erythema  or redness noted. Central nervous system: Alert and oriented x 3 . No focal neurological deficits. Extremities: Symmetric 5 x 5 power. No edema, No cyanosis. Skin: No rashes, lesions or ulcers Psychiatry: Judgement and insight appear normal. Mood & affect appropriate.     Data Reviewed: I have personally reviewed following labs and imaging studies  CBC: Recent Labs  Lab 10/01/20 0749 10/02/20 0704 10/03/20 0317 10/04/20 0247 10/06/20 0237  WBC 15.9* 15.9* 12.5* 15.9* 13.1*  HGB 11.0* 10.4* 10.0* 10.5* 11.2*  HCT 35.8* 34.9* 34.3* 36.2* 39.1  MCV 96.8 97.8 99.1 99.5 100.0  PLT 216 224 254 248 546   Basic Metabolic Panel: Recent Labs  Lab 10/01/20 0749 10/02/20 0704 10/03/20 0317 10/04/20 0247 10/04/20 1005 10/05/20 0251 10/06/20 0237  NA 142 143  143 143 143 142 142 142  144  K 4.0 3.8  3.7 3.6 4.2 4.8 4.0 4.7  4.8  CL 93* 94*  93* 94* 92* 89* 92* 92*  91*  CO2 42* 43*  43* 45* 45* >50* 46* 45*  45*  GLUCOSE 100* 99  98 116* 81 142* 116* 86  85  BUN 26* 26*  26* 27* 26* 25* 27* 27*  27*  CREATININE  0.49* 0.43*  0.52* 0.52*  0.52* 0.58* 0.63 0.57* 0.52*  0.59*  CALCIUM 9.4 9.3  9.3 9.3 9.5 9.7 10.1 10.5*  10.5*  MG 2.1 1.9  --   --   --   --  2.4  PHOS 3.6 3.3  3.3 3.4 3.9  --  5.3* 4.2  4.2   GFR: Estimated Creatinine Clearance: 81.1 mL/min (A) (by C-G formula based on SCr of 0.52 mg/dL (L)). Liver Function Tests: Recent Labs  Lab 10/02/20 0704 10/03/20 0317 10/04/20 0247 10/05/20 0251 10/06/20 0237  ALBUMIN 2.1* 1.9* 2.2* 2.4* 2.6*   No results for input(s): LIPASE, AMYLASE in the last 168 hours. No results for input(s): AMMONIA in the last 168 hours. Coagulation Profile: Recent Labs  Lab 10/04/20 1005  INR 1.0   Cardiac Enzymes: No results for input(s): CKTOTAL, CKMB, CKMBINDEX, TROPONINI in the last 168 hours. BNP (last 3 results) No results for input(s): PROBNP in the last 8760 hours. HbA1C: No results for input(s): HGBA1C in the last 72 hours. CBG: Recent Labs  Lab 10/05/20 1538 10/05/20 1944 10/05/20 2334 10/06/20 0420 10/06/20 0754  GLUCAP 77 123* 107* 107* 146*   Lipid Profile: No results for input(s): CHOL, HDL, LDLCALC, TRIG, CHOLHDL, LDLDIRECT in the last 72 hours. Thyroid Function Tests: No results for input(s): TSH, T4TOTAL, FREET4, T3FREE, THYROIDAB in the last 72 hours. Anemia Panel: No results for input(s): VITAMINB12, FOLATE, FERRITIN, TIBC, IRON, RETICCTPCT in the last 72 hours. Sepsis Labs: No results for input(s): PROCALCITON, LATICACIDVEN in the last 168 hours.   No results found for this or any previous visit (from the past 240 hour(s)).   Radiology Studies: No results found.  Scheduled Meds:  chlorhexidine gluconate (MEDLINE KIT)  15 mL Mouth Rinse BID   Chlorhexidine Gluconate Cloth  6 each Topical Q0600   cholecalciferol  10,000 Units Oral Daily   enoxaparin (LOVENOX) injection  40 mg Subcutaneous Q24H   free water  100 mL Per Tube Q6H   insulin aspart  0-15 Units Subcutaneous Q4H   insulin aspart  4 Units  Subcutaneous Q4H   insulin glargine  16 Units Subcutaneous Daily   mouth rinse  15 mL Mouth Rinse 10 times per day   midodrine  5 mg Per  Tube TID WC   multivitamin  15 mL Per Tube Daily   pantoprazole sodium  40 mg Per Tube Daily   sertraline  50 mg Per Tube Daily   traZODone  100 mg Per Tube QHS   Continuous Infusions:  sodium chloride 20 mL/hr at 10/04/20 1343   feeding supplement (VITAL AF 1.2 CAL) 1,000 mL (10/06/20 1108)   vancomycin Stopped (10/06/20 0526)     LOS: 17 days    Time spent: 25 mins  Jackalyn Haith, MD [facility]   If 7PM-7AM, please contact night-coverage

## 2020-10-06 NOTE — Progress Notes (Signed)
Pt was scheduled last week for TEE but cancelled due to instability.   Today on reading the chart he has desat episodes.  Discussed with Dr. Gardiner Rhyme and he discussed with Dr. Dwyane Dee and pt is not stable for TEE and will be treated 4 weeks instead of 2 weeks with antibiotics.

## 2020-10-06 NOTE — Progress Notes (Signed)
NAME:  Steve [name], MRN:  [id number], DOB:  07/22/1955, LOS: 57 ADMISSION DATE:  09/19/2020, CONSULTATION DATE:  09/24/20 REFERRING MD:  Dr Steve [name], CHIEF COMPLAINT:  Resp failure   BRIEF   65 year old male who is well kwn to PCCM service from [place] 2021 for s/p covid, respiratoy failure, ARDS, pneumothorax, prolonged mechanical ventilation (? 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  At baseline.   Port-A-Cath placement in December 2020 and gastrostomy tube placement in May 2021.  Of note , has Hodgkins Lymphoma  with recuirrence March 2022 (Bulky intrathoracic adenopathy esp new substernal and newosseous metastatic disease, PET 06/19/2020.). As of April 2022 got 2nd dose Adcetris.  The admitted May 2022 for "pneumonia" and Acetris help (in part due to high LFT) and all Rx held.  Also as a preadmission problem: Chronic caxhexia, protein calorie malnutrition - moderate, failure thrive, DM all as his baseline prior to admission problems/ his baseline ECOG May 2022 was 2  Admitted 09/19/20 (recent baseline 8LO2 via trach). With worsening cough, lethargy.  Blood culture came back positive with Staphylococcus epidermis on 09/22/20 (all 4 of 4 cutuirs), infectious disease and s/p Port-A-Cath removal 09/22/20. As a present on admission problem O 09/22/20 - was needed 10L O2. CT scan this admit 09/20/2020 shwoed worrsening lymphadenopathy in chest concerning for Hodgkins progression. On 6/15 - Seen by Dr Steve [name] and recommended ICE protocol when better (highly immune suppressive regimen). ID had recommended 2 week Rx  . On 6/16 - reports he is more hypoxemic needeing >90% fio2 via Trach collar though no increased work of breathing. CCM consulted  Remains full code despite multiple conversations  Pertinent  Medical History  Covid admission March - May 2021  Significant Hospital  Events: Including procedures, antibiotic start and stop dates in addition to other pertinent events   6/16 - ccm consult  6/17 -CT angiogram ruled out pulmonary embolism.  He has progressive Hodgkin's disease on the CT scan of the chest.  He was unable to sustain facemask oxygen and has required being placed on pressure support ventilation through the tracheostomy.  He now has a cuffed 6 sized tracheostomy.  Currently on 50% oxygen and saturating well and more comfortable. 6/18 -stuck on pressure support ventilator at 40% FiO2.  Unable to wean off.  He is watching TV and is able to eat 6/22 slowly progressing PSV wean 6/24 tolerating ATC 6/25 tolerated ATC for about 1 and half hours 6/26 very short tolerance of ATC, back on vent quickly due to dyspnea  Interim History / Subjective:  Steve [name] stayed off the vent overnight. He has some mild dyspnea, but doesn't think he needs to go back on the vent. He otherwise denies complaints today.  Objective   Blood pressure 101/76, pulse 78, temperature 97.6 F (36.4 C), temperature source Oral, resp. rate (!) 24, height 5\' 5"  (1.651 m), weight 69 kg, SpO2 99 %.    FiO2 (%):  [60 %-80 %] 80 %   Intake/Output Summary (Last 24 hours) at 10/06/2020 1642 Last data filed at 10/06/2020 1500 Gross per 24 hour  Intake 3806.63 ml  Output 2245 ml  Net 1561.63 ml    Filed Weights   09/19/20 1930 09/26/20 0258 09/30/20 1646  Weight: 63.3 kg 63.3 kg 69 kg    Examination: Gen: chronically ill appearing man lying in bed watching TV in NAD HEENT: /AT,  eyes anicteric Neck: Tracheostomy in place, no secretions Lungs: Rales on the left, improved compared to yesterday.  No accessory muscle use breathing comfortably on trach collar.. CV: S1-S2, regular rate and rhythm Abd: Soft, nontender, nondistended Ext: No clubbing, cyanosis, or edema Neuro:  awake and alert, nodding and communicating nonverbally to answer questions.  Moving all extremities.  More  interactive today.  Labs/imaging that I have personally reviewed  (right click and "Reselect all SmartList Selections" daily)  Bicarb 45 BUN 27 Cr 0.52 WBC 13.1   LABS    PULMONARY No results for input(s): PHART, PCO2ART, PO2ART, HCO3, TCO2, O2SAT in the last 168 hours.  Invalid input(s): PCO2, PO2   CBC Recent Labs  Lab 10/03/20 0317 10/04/20 0247 10/06/20 0237  HGB 10.0* 10.5* 11.2*  HCT 34.3* 36.2* 39.1  WBC 12.5* 15.9* 13.1*  PLT 254 248 223     CHEMISTRY Recent Labs  Lab 10/01/20 0749 10/02/20 0704 10/03/20 0317 10/04/20 0247 10/04/20 1005 10/05/20 0251 10/06/20 0237  NA 142 143  143 143 143 142 142 142  144  K 4.0 3.8  3.7 3.6 4.2 4.8 4.0 4.7  4.8  CL 93* 94*  93* 94* 92* 89* 92* 92*  91*  CO2 42* 43*  43* 45* 45* >50* 46* 45*  45*  GLUCOSE 100* 99  98 116* 81 142* 116* 86  85  BUN 26* 26*  26* 27* 26* 25* 27* 27*  27*  CREATININE 0.49* 0.43*  0.52* 0.52*  0.52* 0.58* 0.63 0.57* 0.52*  0.59*  CALCIUM 9.4 9.3  9.3 9.3 9.5 9.7 10.1 10.5*  10.5*  MG 2.1 1.9  --   --   --   --  2.4  PHOS 3.6 3.3  3.3 3.4 3.9  --  5.3* 4.2  4.2    Estimated Creatinine Clearance: 81.1 mL/min (A) (by C-G formula based on SCr of 0.52 mg/dL (L)).  Resolved Hospital Problem list    Assessment & Plan:   Acute on chronic respiratory failure- baseline 8L TC. 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 Severe baseline ILD to have CO2 retention. Significant tracheobronchomalacia on CT  -Con't TC trials. Doing better since yesterday, but tolerated minimal time off the vent 2 days ago, so can't leave ICU yet.  -BNP WNL; no role for diuretics here  Chronic metabolic alkalosis Bicarb more stable today after diamox -monitor, may need more  diamox this week, but bicarb stable for now  Deconditioning -PT, OT  -planning to go [facility] with [facility]  Sepsis due to MRSE bacteremia -resolved -Appreciate infectious disease input -Remains on vancomycin> monitoring off antibiotics -Off steroids -per cardiology, no TEE. Plan for 4 weeks antibiotics.  Relapsed Hodgkin's lymphoma Bulky mediastinal adenopathy on CT on 6/17 -not currently a candidate for treatment, but he remains hopeful  Best practice (right click and "Reselect all SmartList Selections" daily)  Per primary   Julian Hy, DO 10/06/20 4:56 PM [facility]

## 2020-10-06 NOTE — Progress Notes (Signed)
OT Cancellation Note  Patient Details Name: Steve [name] MRN: [id number] DOB: 23-Apr-1955   Cancelled Treatment:    Reason Eval/Treat Not Completed: Medical issues which prohibited therapy. Spoke with nursing who reports sats in 21s this AM with patient having increased anxiety. Respiratory therapy present to suction patient and RN providing patient with anxiety medication, asked to defer OT at this time and maybe try back in PM.   [name] OT OT pager: [place] 10/06/2020, 11:59 AM

## 2020-10-06 NOTE — Progress Notes (Signed)
RT called due to patient desat, RT bagged, lavaged and suctioned. Patient saturations increased to 93%. RT obtained moderate white , thick secretions.

## 2020-10-06 NOTE — Progress Notes (Signed)
Nutrition Follow-up  DOCUMENTATION CODES:   Severe malnutrition in context of chronic illness  INTERVENTION:  - continue Vital AF 1.2 @ 65 ml/hr with 100 ml free water QID.    NUTRITION DIAGNOSIS:   Severe Malnutrition related to chronic illness, cancer and cancer related treatments as evidenced by severe fat depletion, severe muscle depletion. 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  Patient was discharged from this hospital on 08/25/2020.  Patient was said to have done very well for the first 3 weeks.  Over the last 5 days, patient has been failing to thrive, with associated poor appetite and malaise.  Patient discussed in rounds this AM.  Patient sleeping at this time with no family or visitors present at this time. He remains on a Carb Modified diet and the last documented meal intake was 50% of lunch on 6/23. Able to talk with RN who reports that patient ate 1/2 piece of [place] toast for breakfast this AM. He has mainly been sleeping since that time after being given Ativan d/t increased anxiety.   He is on trach collar and PEG is in place and is receiving Vital AF 1.2 @ 65 ml/hr with 100 ml free water QID. This regimen is providing 1872 kcal (96% kcal need), 117 grams protein, and 1665 ml free water.  He has not been weighed since 6/22. No information documented in the edema section of flow sheet.      Labs reviewed; CBGs: 107 and 146 mg/dl, Cl: 92 mmol/l, BUN: 27 mg/dl, creatinine: 0.52 mg/dl, Ca: 10.5 mg/dl. Medications reviewed;  10000 units cholecalciferol/day, sliding scale novolog, 4 units novolog every 4 hours, 16 units lantus/day, 15 ml multivitamin/day, 40 mg protonix/day. IVF; NS @ 50 ml/hr.   Diet Order:   Diet Order             Diet Carb Modified Fluid consistency: Thin; Room service appropriate? Yes  Diet effective now                   EDUCATION NEEDS:   Education needs have been addressed  Skin:  Skin Assessment: Skin Integrity Issues: Skin Integrity Issues:: Stage I, Other (Comment) Stage I: sacrum (6/19) Other: MASD to mid-buttocks (newly documented today)  Last BM:  6/27 (type 5 x1)  Height:   Ht Readings from Last 1 Encounters:  09/26/20 5' 5"  (1.651 m)    Weight:   Wt Readings from Last 1 Encounters:  09/30/20 69 kg      Estimated Nutritional Needs:  Kcal:  1950-2150 Protein:  95-110g Fluid:  2L/day     Jarome Matin, MS, RD, LDN, CNSC Inpatient Clinical Dietitian RD pager # available in [facility]  After hours/weekend pager # available in [facility]

## 2020-10-07 ENCOUNTER — Encounter (HOSPITAL_COMMUNITY)
Admission: EM | Disposition: A | Discharge status: Home / Self Care | Payer: Self-pay | Source: Home / Self Care | Attending: Internal Medicine

## 2020-10-07 DIAGNOSIS — T451X5A Adverse effect of antineoplastic and immunosuppressive drugs, initial encounter: Secondary | ICD-10-CM

## 2020-10-07 DIAGNOSIS — G620 Drug-induced polyneuropathy: Secondary | ICD-10-CM | POA: Diagnosis not present

## 2020-10-07 DIAGNOSIS — J9611 Chronic respiratory failure with hypoxia: Secondary | ICD-10-CM | POA: Diagnosis not present

## 2020-10-07 DIAGNOSIS — R7881 Bacteremia: Secondary | ICD-10-CM | POA: Diagnosis not present

## 2020-10-07 DIAGNOSIS — R627 Adult failure to thrive: Secondary | ICD-10-CM | POA: Diagnosis not present

## 2020-10-07 LAB — GLUCOSE, CAPILLARY
Glucose-Capillary: 83 mg/dL (ref 70–99)
Glucose-Capillary: 119 mg/dL — ABNORMAL HIGH (ref 70–99)
Glucose-Capillary: 201 mg/dL — ABNORMAL HIGH (ref 70–99)
Glucose-Capillary: 105 mg/dL — ABNORMAL HIGH (ref 70–99)
Glucose-Capillary: 125 mg/dL — ABNORMAL HIGH (ref 70–99)

## 2020-10-07 LAB — RENAL FUNCTION PANEL
Albumin: 2.7 g/dL — ABNORMAL LOW (ref 3.5–5.0)
Anion gap: 6 (ref 5–15)
BUN: 25 mg/dL — ABNORMAL HIGH (ref 8–23)
CO2: 46 mmol/L — ABNORMAL HIGH (ref 22–32)
Calcium: 10.1 mg/dL (ref 8.9–10.3)
Chloride: 89 mmol/L — ABNORMAL LOW (ref 98–111)
Creatinine, Ser: 0.47 mg/dL — ABNORMAL LOW (ref 0.61–1.24)
GFR, Estimated: >60 mL/min (ref >60)
Glucose, Bld: 93 mg/dL (ref 70–99)
Phosphorus: 3.6 mg/dL (ref 2.5–4.6)
Potassium: 4.6 mmol/L (ref 3.5–5.1)
Sodium: 141 mmol/L (ref 135–145)

## 2020-10-07 SURGERY — ECHOCARDIOGRAM, TRANSESOPHAGEAL
Anesthesia: Monitor Anesthesia Care

## 2020-10-07 MED ORDER — ORAL CARE MOUTH RINSE
15.0000 mL | Freq: Two times a day (BID) | OROMUCOSAL | Status: DC
  Administered 2020-10-08 – 2020-10-26 (×35): 15 mL via OROMUCOSAL

## 2020-10-07 NOTE — Progress Notes (Signed)
Physical Therapy Treatment Patient Details Name: Steve [name] MRN: [id number] DOB: Apr 05, 1956 Today's Date: 10/07/2020    History of Present Illness Patient  is a 65 year old male who was admitted for possible pneumonia and found to have a staph epidermidis bacteremia. PMH significant for BJYNW-29 infection complicated by ARDS, pulmonary fibrosis / pneumonitis and chronic respiratory failure on 8 L via trach at baseline, PEG dependence, Hodgkin's lymphoma on chemo, type 2 diabetes, hypertension and GERD presented in the ED with generalized malaise, poor appetite and failure to thrive.  0n 09/24/20, developed respiratory distress with increased oxygen requirement and was transferred to ICU and started on pressure support ventilation. currently on trach collar on 10L fiO2 at 60% with intermittent vent support.    PT Comments    Pt OOB in recliner via OT on 10 lts 80% TRACH.  RN and NT advised pt wear a NRB over [facility] at 15 lts for amb.  Very limited activity tolerance. General Gait Details: very limited amb distance due to weakness and need to sit.  Reclinwer following closely behind for safety. Positioned in recliner to comfort.   Follow Up Recommendations  No PT follow up     Equipment Recommendations  None recommended by PT    Recommendations for Other Services       Precautions / Restrictions Precautions Precautions: Fall Precaution Comments: trach at 10 L: 80% fio2, vent use,PEG tube, Restrictions Weight Bearing Restrictions: No    Mobility  Bed Mobility Overal bed mobility: Needs Assistance Bed Mobility: Supine to Sit     Supine to sit: Min guard;HOB elevated     General bed mobility comments: OOB in recliner via OT    Transfers Overall transfer level: Needs assistance Equipment used: Bilateral platform walker (EVA walker) Transfers: Sit to/from [facility] Sit to Stand: Min assist Stand pivot transfers: Min guard       General transfer  comment: + 2 side by side Min Assist to stand from recliner.  Ambulation/Gait Ambulation/Gait assistance: Mod assist;+2 safety/equipment Gait Distance (Feet): 6 Feet Assistive device: Bilateral platform walker (EVA walker) Gait Pattern/deviations: Step-to pattern Gait velocity: decreased   General Gait Details: very limited amb distance due to weakness and need to sit.  Reclinwer following closely behind for safety.   Stairs             Wheelchair Mobility    Modified Rankin (Stroke Patients Only)       Balance Overall balance assessment: Needs assistance Sitting-balance support: No upper extremity supported Sitting balance-Leahy Scale: Fair Sitting balance - Comments: tending to prop self with arms due to dyspnea   Standing balance support: Bilateral upper extremity supported Standing balance-Leahy Scale: Poor Standing balance comment: use of walker with standing                            Cognition Arousal/Alertness: Awake/alert Behavior During Therapy: WFL for tasks assessed/performed Overall Cognitive Status: Within Functional Limits for tasks assessed                                 General Comments: AxO x 3 very motivated.  Non verbal due to [facility] but does mouth words and use hand gestures      Exercises      General Comments        Pertinent Vitals/Pain Pain Assessment: No/denies pain  Home Living                      Prior Function            PT Goals (current goals can now be found in the care plan section) Acute Rehab PT Goals Patient Stated Goal: walk again Progress towards PT goals: Progressing toward goals    Frequency    Min 3X/week      PT Plan Current plan remains appropriate    Co-evaluation              AM-PAC PT "6 Clicks" Mobility   Outcome Measure  Help needed turning from your back to your side while in a flat bed without using bedrails?: A Little Help needed moving from  lying on your back to sitting on the side of a flat bed without using bedrails?: A Little Help needed moving to and from a bed to a chair (including a wheelchair)?: A Little Help needed standing up from a chair using your arms (e.g., wheelchair or bedside chair)?: A Lot Help needed to walk in hospital room?: A Lot Help needed climbing 3-5 steps with a railing? : Total 6 Click Score: 14    End of Session Equipment Utilized During Treatment: Gait belt Activity Tolerance: Treatment limited secondary to medical complications (Comment) Patient left: in chair;with call bell/phone within reach Nurse Communication: Mobility status PT Visit Diagnosis: Unsteadiness on feet (R26.81);Muscle weakness (generalized) (M62.81)     Time: 1030-1057 PT Time Calculation (min) (ACUTE ONLY): 27 min  Charges:  $Gait Training: 8-22 mins                     Rica Koyanagi  PTA Acute  Rehabilitation Services Pager      [phone] Office      [phone]

## 2020-10-07 NOTE — TOC Progression Note (Signed)
Transition of Care [facility]) - Progression Note    Patient Details  Name: Steve [name] MRN: [id number] Date of Birth: 1956-01-27  Transition of Care [facility]) CM/SW Contact  Leeroy Cha, RN Phone Number: 10/07/2020, 9:03 AM  Clinical Narrative:    Significant Hospital Events: Including procedures, antibiotic start and stop dates in addition to other pertinent events  6/16 - ccm consult   6/17 -CT angiogram ruled out pulmonary embolism.  He has progressive Hodgkin's disease on the CT scan of the chest.  He was unable to sustain facemask oxygen and has required being placed on pressure support ventilation through the tracheostomy.  He now has a cuffed 6 sized tracheostomy.  Currently on 50% oxygen and saturating well and more comfortable. 6/18 -stuck on pressure support ventilator at 40% FiO2.  Unable to wean off.  He is watching TV and is able to eat 6/22 slowly progressing PSV wean 6/24 tolerating ATC 6/25 tolerated ATC for about 1 and half hours 6/26 very short tolerance of ATC, back on vent quickly due to dyspnea   Interim History / Subjective:  [name] stayed off the vent overnight. He has some mild dyspnea, but doesn't think he needs to go back on the vent. He otherwise denies complaints today.  TOC PLAN OF CARE: Following for progression and toc needs.  Pt will need hhc to follow once at home. Expected Discharge Plan: [place] Barriers to Discharge: Continued Medical Work up  Expected Discharge Plan and Services Expected Discharge Plan: [place] In-house Referral: Clinical Social Work Discharge Planning Services: CM Consult Post Acute Care Choice: [place] arrangements for the past 2 months: Mobile Home                 DME Arranged: N/A DME Agency: NA       HH Arranged: PT [place]: [place] ([place]) Date [place]: 09/22/20   Representative spoke with at [place]:  [place] (SDOH) Interventions    Readmission Risk Interventions Readmission Risk Prevention Plan 09/22/2020 08/24/2020 07/22/2019  Transportation Screening Complete Complete Complete  Medication Review Press photographer) Complete Complete Complete  HRI or Home Care Consult Complete Complete -  SW Recovery Care/Counseling Consult Complete Complete -  Palliative Care Screening Not Applicable Not Applicable -  [place] Not Applicable Not Applicable -  Some recent data might be hidden

## 2020-10-07 NOTE — Progress Notes (Signed)
Occupational Therapy Treatment Patient Details Name: Steve [name] MRN: [id number] DOB: 1955/06/12 Today's Date: 10/07/2020    History of present illness Patient  is a 65 year old male who was admitted for possible pneumonia and found to have a staph epidermidis bacteremia. PMH significant for TWSFK-81 infection complicated by ARDS, pulmonary fibrosis / pneumonitis and chronic respiratory failure on 8 L via trach at baseline, PEG dependence, Hodgkin's lymphoma on chemo, type 2 diabetes, hypertension and GERD presented in the ED with generalized malaise, poor appetite and failure to thrive.  0n 09/24/20, developed respiratory distress with increased oxygen requirement and was transferred to ICU and started on pressure support ventilation. currently on trach collar on 10L fiO2 at 60% with intermittent vent support.   OT comments  Patient found on 10 L 80% fio2 trach collar. Patient min guard to transfer into sitting and propping at side of bed to dyspnea. Patient's trach mask pulled to the side during transfer and o2 sat dropped to 66% but patient recovered quickly to low 90s. Patient found to be incontinent of BM and total care required for pericare as patient stood and held onto walker. Patient able to stand 1 min 40 seconds on second stand for completing task before needing seated rest break. Between pericare cleaning patient took steps to recliner with RW with min guard but before therapist and nursing ready - despite verbal cues - and once again mask was pulled to the side and patient's o2 sat dropped again to 67%. Once again able to recover quickly and stand again. Patient fatigued after task but otherwise able to tolerate increased standing time. Cont POC.   Follow Up Recommendations  Home health OT    Equipment Recommendations  3 in 1 bedside commode    Recommendations for Other Services      Precautions / Restrictions Precautions Precautions: Fall Precaution Comments: trach at 10 L:  80% fio2, vent use,PEG tube, Restrictions Weight Bearing Restrictions: No       Mobility Bed Mobility Overal bed mobility: Needs Assistance Bed Mobility: Supine to Sit     Supine to sit: Min guard;HOB elevated     General bed mobility comments: min guard to transfer to head of bed. Assistance with lines/leads only.    Transfers Overall transfer level: Needs assistance Equipment used: Rolling walker (2 wheeled) Transfers: Sit to/from [facility] Sit to Stand: Min guard Stand pivot transfers: Min guard       General transfer comment: increased time to power up but just min guard/ intermittent touching for steadying. Sat dropped to 67% with transfer to recliner - as trach colar mask pulled to the left during the transfer. Patient recovered quickly into low 90s. Able to stand 1 min 40 sec for pericare before needing seated rest break.    Balance Overall balance assessment: Needs assistance Sitting-balance support: No upper extremity supported Sitting balance-Leahy Scale: Fair Sitting balance - Comments: tending to prop self with arms due to dyspnea   Standing balance support: Bilateral upper extremity supported Standing balance-Leahy Scale: Poor Standing balance comment: use of walker with standing                           ADL either performed or assessed with clinical judgement   ADL Overall ADL's : Needs assistance/impaired                           Toilet  Transfer Details (indicate cue type and reason): Patient found to be inontinent Toileting- Water quality scientist and Hygiene: Total assistance;Sit to/from stand Toileting - Clothing Manipulation Details (indicate cue type and reason): Total assist for pericare after found to be incontinenet of BM. +2 assistance for standing, line management and cleaning BM. Patient's ventimask over trach got pulled to the left during transfer into sitting and sats down to 66% but recovered quickly  when mask repositioned. Increased dyspnea with transfer.             Vision Patient Visual Report: No change from baseline     Perception     Praxis      Cognition Arousal/Alertness: Awake/alert Behavior During Therapy: WFL for tasks assessed/performed Overall Cognitive Status: Within Functional Limits for tasks assessed                                          Exercises     Shoulder Instructions       General Comments      Pertinent Vitals/ Pain       Pain Assessment: No/denies pain  Home Living                                          Prior Functioning/Environment              Frequency  Min 2X/week        Progress Toward Goals  OT Goals(current goals can now be found in the care plan section)  Progress towards OT goals: OT to reassess next treatment  Acute Rehab OT Goals Patient Stated Goal: walk again OT Goal Formulation: With patient Time For Goal Achievement: 10/15/20 Potential to Achieve Goals: Good  Plan Discharge plan remains appropriate    Co-evaluation                 AM-PAC OT "6 Clicks" Daily Activity     Outcome Measure   Help from another person eating meals?: None Help from another person taking care of personal grooming?: A Little Help from another person toileting, which includes using toliet, bedpan, or urinal?: Total Help from another person bathing (including washing, rinsing, drying)?: A Lot Help from another person to put on and taking off regular upper body clothing?: A Little Help from another person to put on and taking off regular lower body clothing?: A Lot 6 Click Score: 15    End of Session Equipment Utilized During Treatment: Oxygen;Rolling walker  OT Visit Diagnosis: Other abnormalities of gait and mobility (R26.89);Muscle weakness (generalized) (M62.81)   Activity Tolerance Other (comment);Patient limited by fatigue (limited by dyspnea)   Patient Left in  chair;with call bell/phone within reach;with nursing/sitter in room   Nurse Communication Mobility status        Time: 2947-6546 OT Time Calculation (min): 27 min  Charges: OT General Charges $OT Visit: 1 Visit OT Treatments $Self Care/Home Management : 8-22 mins $Therapeutic Activity: 8-22 mins  Derl Barrow, OTR/L [place]  Office [phone] Pager: [place] 10/07/2020, 12:10 PM

## 2020-10-07 NOTE — Progress Notes (Signed)
[facility]                                                                              Patient Demographics  Steve [name], is a 65 y.o. male, DOB - [birth year], VFM:[id number]  Admit date - 09/19/2020   Admitting Physician Bonnell Public, MD  Outpatient Primary MD for the patient is Saguier, Steve [name]  Outpatient specialists:   LOS - 18  days   Medical records reviewed and are as summarized below:    Chief Complaint  Patient presents with   Hyperkalemia       Brief summary   Briefly, 65 years old male with PMH significant for KRCVK-18 infection complicated by ARDS, pulmonary fibrosis / pneumonitis and chronic respiratory failure on 8 L via trach, PEG dependence, Hodgkin's lymphoma on chemo, type 2 diabetes, hypertension and GERD presented in the ED with generalized malaise, poor appetite and failure to thrive and was admitted for possible pneumonia and found to have a staph epidermidis bacteremia.  First negative blood cultures 09/22/20.  0n 09/24/20, patient developed respiratory distress with increased oxygen requirement and was transferred to ICU and started on pressure support ventilation.  Critical care and ID consulted.   ID recommended TEE to rule out infective endocarditis, however not stable to have TEE at this time per cardiology.    ID recommended 6 weeks of IV antibiotics from first negative blood cultures on 09/22/20, if unable to have TEE.  Cardiology was reconsulted to reassess if patient is stable for TEE on 6/28 however still having descending episodes and unstable for TEE..     Assessment & Plan    Principal Problem: Sepsis due to pneumonia, staph epidermidis bacteremia: Present on admission -Met sepsis criteria on presentation with tachycardia, tachypnea, leukocytosis, chest x-ray with possible recurrent pneumonia -CT chest with interval increase in bulk of mediastinal and axillary adenopathy. -Blood cultures grew staph  epidermidis, Port-A-Cath was removed on 6/16. -Patient remained on cefepime from 6/11-6/15.  ID recommended TEE to exclude endocarditis, however patient has been unstable for TEE -Continue midodrine for hypotension, stress dose steroids tapered off on 6/24 -ID recommended vancomycin for minimum 6 weeks from date of negative culture on 6/14, will need 4 more weeks upon discharge (if TEE not done).  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 Marin Olp on 6/20, PICC line has been placed  Diabetes mellitus type 2, NIDDM with hyperglycemia, uncontrolled -Continue moderate SSI, NovoLog 4 units every 4 hours, Lantus 16 units daily   Normocytic anemia, anemia of chronic disease -H&H stable  Hyponatremia, hypokalemia -Resolved   Pressure injury -Sacrum mid stage I -Wound care per RN  Severe malnutrition secondary to chronic illness, malignancy -Continue tube feeds Full CODE STATUS Estimated body mass index is 25.31 kg/m as calculated from the following:   Height as of this encounter: 5\' 5"  (1.651 m).   Weight as of this encounter: 69 kg.  Code Status: full  DVT Prophylaxis:  enoxaparin (LOVENOX) injection 40 mg Start: 09/19/20 2200   Level of Care: Level of care: ICU Family Communication: Discussed all  imaging results, lab results, explained to the patient    Disposition Plan:     Status is: Inpatient  Remains inpatient appropriate because:Inpatient level of care appropriate due to severity of illness  Dispo: The patient is from: Home              Anticipated d/c is to: Home              Patient currently is not medically stable to d/c.   Difficult to place patient No      Time Spent in minutes   11/19/20    Procedures:  Mech vent   Consultants:   Pulmonary critical care Oncology ID Palliative medicine    Antimicrobials:   Anti-infectives (From admission, onward)    Start     Dose/Rate Route Frequency Ordered Stop   09/23/20 0800  ceFEPIme (MAXIPIME) 2 g in sodium chloride 0.9 % 100 mL IVPB  Status:  Discontinued        2 g 200 mL/hr over 30 Minutes Intravenous Every 8 hours 09/23/20 0738 09/23/20 1758   09/21/20 0400  vancomycin (VANCOREADY) IVPB 750 mg/150 mL        750 mg 150 mL/hr over 60 Minutes Intravenous Every 12 hours 09/20/20 1715 11/03/20 2359   09/20/20 1730  vancomycin (VANCOREADY) IVPB 750 mg/150 mL        750 mg 150 mL/hr over 60 Minutes Intravenous STAT 09/20/20 1714 09/20/20 1954   09/20/20 0400  vancomycin (VANCOREADY) IVPB 750 mg/150 mL  Status:  Discontinued        750 mg 150 mL/hr over 60 Minutes Intravenous Every 12 hours 09/19/20 1932 09/20/20 1155   09/20/20 0000  ceFEPIme (MAXIPIME) 2 g in sodium chloride 0.9 % 100 mL IVPB  Status:  Discontinued        2 g 200 mL/hr over 30 Minutes Intravenous Every 8 hours 09/19/20 1932 09/23/20 0721   09/19/20 1600  ceFEPIme (MAXIPIME) 2 g in sodium chloride 0.9 % 100 mL IVPB        2 g 200 mL/hr over 30 Minutes Intravenous  Once 09/19/20 1548 09/19/20 1634   09/19/20 1600  vancomycin (VANCOREADY) IVPB 1250 mg/250 mL        1,250 mg 166.7 mL/hr over 90 Minutes Intravenous STAT 09/19/20 1557 09/19/20 1822          Medications  Scheduled Meds:  chlorhexidine gluconate (MEDLINE KIT)  15 mL Mouth  Rinse BID   Chlorhexidine Gluconate Cloth  6 each Topical Q0600   cholecalciferol  10,000 Units Oral Daily   enoxaparin (LOVENOX) injection  40 mg Subcutaneous Q24H   free water  100 mL Per Tube Q6H   insulin aspart  0-15 Units Subcutaneous Q4H   insulin aspart  4 Units Subcutaneous Q4H   insulin glargine  16 Units Subcutaneous Daily   mouth rinse  15 mL Mouth Rinse BID   midodrine  5 mg Per Tube TID WC  multivitamin  15 mL Per Tube Daily   pantoprazole sodium  40 mg Per Tube Daily   sertraline  50 mg Per Tube Daily   sodium chloride flush  10-40 mL Intracatheter Q12H   traZODone  100 mg Per Tube QHS   Continuous Infusions:  sodium chloride 20 mL/hr at 10/04/20 1343   feeding supplement (VITAL AF 1.2 CAL) 65 mL/hr at 10/07/20 0800   vancomycin Stopped (10/07/20 0425)   PRN Meds:.albuterol, guaiFENesin, hyoscyamine, LORazepam, sodium chloride flush      Subjective:   Abdel Effinger was seen and examined today.  Still has hypoxia, on 80% FiO2, desats and not able to wean down.  Patient denies dizziness, abdominal pain, N/V.  No fevers, no acute issues overnight.  Objective:   Vitals:   10/07/20 0800 10/07/20 0918 10/07/20 1000 10/07/20 1114  BP: 104/66 (!) 109/57 108/60   Pulse: 86  72 78  Resp: (!) 28  17 (!) 28  Temp: 98.5 F (36.9 C)     TempSrc: Oral     SpO2: 97%  97% 92%  Weight:      Height:        Intake/Output Summary (Last 24 hours) at 10/07/2020 1156 Last data filed at 10/07/2020 2376 Gross per 24 hour  Intake 2655 ml  Output 2400 ml  Net 255 ml     Wt Readings from Last 3 Encounters:  09/30/20 69 kg  09/18/20 64.4 kg  09/08/20 64.4 kg     Exam General: Alert and oriented x 3, chronically ill-appearing HEENT: tracheostomy in place Cardiovascular: S1 S2 auscultated, no murmurs, RRR Respiratory: Bilateral rhonchi Gastrointestinal: Soft, nontender, nondistended, + bowel sounds Ext: no pedal edema bilaterally Neuro: moving all 4  extremities Psych: Normal affect and demeanor, alert and oriented x3    Data Reviewed:  I have personally reviewed following labs and imaging studies  Micro Results No results found for this or any previous visit (from the past 240 hour(s)).  Radiology Reports DG Chest 2 View  Result Date: 09/18/2020 CLINICAL DATA:  Acute on chronic respiratory failure. EXAM: CHEST - 2 VIEW COMPARISON:  Aug 15, 2020. FINDINGS: Stable cardiomegaly. Tracheostomy tube is in good position. Left-sided Port-A-Cath is unchanged. Stable bilateral lung opacities are noted concerning for multifocal pneumonia or chronic scarring. No pneumothorax or pleural effusion is noted. Bony thorax is unremarkable. IMPRESSION: Stable bilateral lung opacities as described above. Electronically Signed   By: Marijo Conception M.D.   On: 09/18/2020 12:59   CT CHEST WO CONTRAST  Result Date: 09/20/2020 CLINICAL DATA:  Respiratory failure.  History of Hodgkin's lymphoma. EXAM: CT CHEST WITHOUT CONTRAST TECHNIQUE: Multidetector CT imaging of the chest was performed following the standard protocol without IV contrast. COMPARISON:  July 21, 2020 FINDINGS: Cardiovascular: Heart is mildly enlarged. LEFT chest port tip terminates at the superior cavoatrial junction. No pericardial effusion. Minimal atherosclerotic calcifications. Enlarged pulmonary artery as can be seen in pulmonary arterial hypertension. Mediastinum/Nodes: Revisualization of extensive adenopathy. LEFT level 3 axillary lymph node measures 11 mm, previously 3 mm (series 2, image 42). LEFT supraclavicular lymph node measures 15 mm, previously 15 mm (series 2, image 24). RIGHT level 1 axillary lymph node measures 8 mm, previously 4 mm (series 2, image 63). RIGHT internal mammary lymph node measures 31 mm, previously 26 mm (series 2, image 77). RIGHT paratracheal conglomeration spanned 59 x 43 mm, previously 44 by 30 mm (series 2, image 45). There is a lunate configuration of the trachea.  Lungs/Pleura:  Visualization of a peripheral rind of fluid adjacent to the RIGHT lung, similar in comparison to prior. Extensive bilateral ground-glass opacities with subpleural reticulation and suggestion of honeycombing. Peribronchovascular nodularity is similar in comparison to prior. Similar appearance of RIGHT middle lobe atelectasis. Upper Abdomen: No acute abnormality. Musculoskeletal: Known osseous metastatic disease is not well visualized. Limited evaluation of the ribs secondary to respiratory motion. No acute thoracic spine fracture. IMPRESSION: 1. Interval increase in bulky adenopathy within the mediastinum consistent with known lymphoma. Increased axillary lymph node size in comparison to prior. 2. Revisualization of sequela of advanced pulmonary fibrosis. Unchanged appearance of peribronchovascular nodularity. This could reflect lymphangitic spread of lymphoma versus underlying infection. 3. Unchanged small rind of RIGHT-sided pleural fluid. Underlying pleural nodularity from lymphoma remains in the differential. 4. 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 5. Enlarged pulmonary artery as can be seen in pulmonary arterial hypertension. Electronically Signed   By: Valentino Saxon MD   On: 09/20/2020 17:25   CT Angio Chest Pulmonary Embolism (PE) W or WO Contrast  Result Date: 09/25/2020 CLINICAL DATA:  Cough and hypoxia.  COVID-19. EXAM: CT ANGIOGRAPHY CHEST WITH CONTRAST TECHNIQUE: Multidetector CT imaging of the chest was performed using the standard protocol during bolus administration of intravenous contrast. Multiplanar CT image reconstructions and MIPs were obtained to evaluate the vascular anatomy. CONTRAST:  7mL OMNIPAQUE IOHEXOL 350 MG/ML SOLN COMPARISON:  09/20/2020 FINDINGS: Cardiovascular: Contrast injection is sufficient to demonstrate satisfactory opacification of the pulmonary arteries to the segmental level. There is no pulmonary  embolus or evidence of right heart strain. The size of the main pulmonary artery is normal. Heart size is normal, with no pericardial effusion. The course and caliber of the aorta are normal. There is no atherosclerotic calcification. No acute aortic syndrome. Mediastinum/Nodes: Bulky mediastinal adenopathy is unchanged since the recent study of 09/20/2020. Lungs/Pleura: Tracheostomy tube tip is at the level of the clavicular heads. There is increased pleural fluid on the right. Diffuse pleural thickening is unchanged. Unchanged appearance of diffuse parenchymal interstitial abnormality. Upper Abdomen: Contrast bolus timing is not optimized for evaluation of the abdominal organs. The visualized portions of the organs of the upper abdomen are normal. Musculoskeletal: No chest wall abnormality. No bony spinal canal stenosis. Review of the MIP images confirms the above findings. IMPRESSION: 1. No pulmonary embolus or acute aortic syndrome. 2. Bulky mediastinal adenopathy and diffuse pleural, interstitial and parenchymal pulmonary abnormalities are unchanged since 09/20/2020. Electronically Signed   By: Ulyses Jarred M.D.   On: 09/25/2020 02:54   IR REMOVAL TUN ACCESS W/ PORT W/O FL MOD SED  Result Date: 09/22/2020 INDICATION: 65 year old male with a history of Hodgkin lymphoma currently presenting with bacteremia associated with pneumonia. He has a left chest port catheter which was placed by interventional radiology (Dr. Annamaria Boots) in December of 2020. he presents for port catheter removal to help facilitate clearance of bacteremia. EXAM: REMOVAL RIGHT IJ VEIN PORT-A-CATH MEDICATIONS: None ANESTHESIA/SEDATION: Moderate (conscious) sedation was employed during this procedure. A total of Versed 1 mg and Fentanyl 50 mcg was administered intravenously. Moderate Sedation Time: 12 minutes. The patient's level of consciousness and vital signs were monitored continuously by radiology nursing throughout the procedure under my  direct supervision. FLUOROSCOPY TIME:  None. COMPLICATIONS: None immediate. PROCEDURE: Informed written consent was obtained from the patient after a thorough discussion of the procedural risks, benefits and alternatives. All questions were addressed. Maximal Sterile Barrier Technique was utilized including caps, mask, sterile gowns, sterile  gloves, sterile drape, hand hygiene and skin antiseptic. A timeout was performed prior to the initiation of the procedure. The right chest was prepped and draped in a sterile fashion. Lidocaine was utilized for local anesthesia. An incision was made over the previously healed surgical incision. Utilizing blunt dissection, the port catheter and reservoir were removed from the underlying subcutaneous tissue in their entirety. Securing sutures were also removed. The pocket was irrigated with a copious amount of sterile normal saline. The wound was examined. No evidence of purulence. The pocket was closed with interrupted 3-0 Vicryl stitches. The subcutaneous tissue was closed with 3-0 Vicryl interrupted subcutaneous stitches. Dermabond was applied. IMPRESSION: Successful right IJ vein Port-A-Cath explant. Electronically Signed   By: Jacqulynn Cadet M.D.   On: 09/22/2020 16:18   DG CHEST PORT 1 VIEW  Result Date: 09/24/2020 CLINICAL DATA:  Respiratory distress EXAM: PORTABLE CHEST 1 VIEW COMPARISON:  09/19/2020 FINDINGS: Lung volumes are small with elevation of the right hemidiaphragm, stable since prior examination. Superimposed multifocal pulmonary infiltrates likely related to chronic underlying interstitial lung disease appears stable, better assessed on CT examination of 09/20/2020. Superimposed right pleural thickening is again noted. Tracheostomy unchanged. Cardiac size is enlarged with associated superior mediastinal widening again noted related to extensive mediastinal and pericardial adenopathy better demonstrated on prior CT examination. No acute bone abnormality.  IMPRESSION: Stable diffuse pulmonary infiltrates likely related to chronic underlying interstitial lung disease and asymmetric right pleural thickening. Stable enlargement of the cardiomediastinal silhouette related to extensive mediastinal and pericardial adenopathy better appreciated on prior CT examination. Electronically Signed   By: Fidela Salisbury MD   On: 09/24/2020 11:41   DG Chest Port 1 View  Result Date: 09/19/2020 CLINICAL DATA:  Shortness of breath. EXAM: PORTABLE CHEST 1 VIEW COMPARISON:  Chest x-ray dated September 18, 2020. FINDINGS: Unchanged tracheostomy tube and left chest wall port catheter. Stable cardiomegaly and mediastinal widening related to underlying lymphadenopathy. Unchanged diffuse patchy nodular opacities throughout both lungs superimposed on chronic pulmonary fibrosis. Unchanged small right pleural effusion. No pneumothorax. No acute osseous abnormality. IMPRESSION: 1. Unchanged multifocal pneumonia superimposed on chronic severe pulmonary fibrosis. 2. Unchanged mediastinal lymphadenopathy. Electronically Signed   By: Titus Dubin M.D.   On: 09/19/2020 15:39   DG Swallowing Func-Speech Pathology  Result Date: 09/20/2020 Formatting of this result is different from the original. Objective Swallowing Evaluation: Type of Study: MBS-Modified Barium Swallow Study  Patient Details Name: SAMEUL TAGLE MRN: [id number] Date of Birth: 09/09/1955 Today's Date: 09/20/2020 Time: SLP Start Time (ACUTE ONLY): 0903 -SLP Stop Time (ACUTE ONLY): 1779 SLP Time Calculation (min) (ACUTE ONLY): 20 min Past Medical History: Past Medical History: Diagnosis Date  Abscess of muscle 08/10/2011  staph infection of right hip   Acute on chronic respiratory failure with hypoxia ([name])   Acute respiratory distress syndrome (ARDS) due to COVID-19 virus ([place])   Anxiety   Diabetes mellitus without complication ([name])   GERD (gastroesophageal reflux disease)   rare reflux - no meds for reflux - NO PROBLEM IN PAST  SEVERAL YRS  HCAP (healthcare-associated pneumonia)   Hip dysplasia, congenital   no surgery as a child for hip dysplasia - has had bilateral hip replacements as an adult  Hodgkin lymphoma ([place])   Hypertension   Pancreatitis   Pneumothorax, acute   Postoperative anemia due to acute blood loss 09/07/2012  Septic arthritis of hip ([place]) 09/05/2012  PT'S TOTAL HIP JOINT REMOVED - ANTIBIOTIC SPACE PLACED AND PT HAS FINISHED IV ANTIBIOTICS (  PICC LINE REMOVED)  Sleep apnea   USES CPAP Past Surgical History: Past Surgical History: Procedure Laterality Date  COLONOSCOPY  04/26/2007  HERNIA REPAIR    inguinal hernia x3  IR GASTROSTOMY TUBE MOD SED  08/22/2019  IR IMAGING GUIDED PORT INSERTION  03/29/2019  JOINT REPLACEMENT  2002 & 2007  bilateral hip replacement  LYMPH NODE BIOPSY Right 03/20/2019  Procedure: DEEP RIGHT SUPRACLAVICULAR LYMPH NODE EXCISION;  Surgeon: Fanny Skates, MD;  Location: [facility];  Service: General;  Laterality: Right;  MULTIPLE EXTRACTIONS WITH ALVEOLOPLASTY  07/28/2011  Procedure: MULTIPLE EXTRACION WITH ALVEOLOPLASTY;  Surgeon: Lenn Cal, DDS;  Location: [facility];  Service: Oral Surgery;  Laterality: N/A;  Extraction of tooth #'s 2,3,4,5,6,11,12,13,15,19,22 with alveoloplasty.  shoulder repair - right for separation of shoulder    TEE WITHOUT CARDIOVERSION  07/29/2011  Procedure: TRANSESOPHAGEAL ECHOCARDIOGRAM (TEE);  Surgeon: Josue Hector, MD;  Location: [facility];  Service: Cardiovascular;  Laterality: N/A;  TOTAL HIP REVISION Right 09/05/2012  Procedure: RIGHT HIP RESECTION ARTHROPLASTY WITH ANTIBIOTIC SPACERS;  Surgeon: [name], MD;  Location: [facility];  Service: Orthopedics;  Laterality: Right;  TOTAL HIP REVISION Right 11/30/2012  Procedure: RIGHT TOTAL HIP ARTHROPLASTY REIMPLANTATION;  Surgeon: [name], MD;  Location: [facility];  Service: Orthopedics;  Laterality: Right; HPI: Patient is a 65 year old Caucasian  male with medical history significant of chronic  tracheostomy after prolonged hospitalization in 2021 related with pneumonia due to QAESL-75 complicated by ARDS and pulmonary fibrosis +/-pneumonitis from brentuximab, s/p PEG, HTN, DM type II, Hodgkin's lymphoma on chemotherapy and GERD.  Patient was discharged from this hospital on 08/25/2020.  Patient was said to have done very well for the first 3 weeks.  Over the last 5 days, patient has been failing to thrive, with associated poor appetite and malaise.  Readmitted with unchanged multifocal PNA.  Subjective: pt alert, eager to go home Assessment / Plan / Recommendation CHL IP CLINICAL IMPRESSIONS 09/20/2020 Clinical Impression Patient presents with excellent oropharyngeal swallowing function. Oral phase timely and with full clearance of bolus. Pharyngeally, strength has improved from previous MBS 03/2020 in that patient has no pharyngeal residue post swallow. Intermittent trace penetration of thin liquids noted, primarily via straw, due to mildly decreased laryngeal closure noted, remaining above the vocal cords and clearing with either subsequent dry swallow or cued throat clear. Tested all consistencies both with and without PMSV in place with no notable difference in swallowing function. Educated patient and spouse (via phone per patient request) on results of testing and recommendations which include use of PMV if able (not necessary) to faciliate improved strength of cough/throat clear if needed. Both verbalized understanding. SLP will f/u briefly for tolerance and continued education as needed. Do not suspect that dysphagia is resulting in recurrent PNAs. Cannot however r/o contribution of PEG tube feedings on aspiration, particularly because patient is also taking pos by mouth in conjunction with tube feeds, and complains of feeling full often. Recommend dietary consult to ensure that patient is being fed via tube the correct amount and in the correct manner based on how much he consuming by mouth. SLP  Visit Diagnosis Dysphagia, pharyngeal phase (R13.13) Attention and concentration deficit following -- Frontal lobe and executive function deficit following -- Impact on safety and function Mild aspiration risk   CHL IP TREATMENT RECOMMENDATION 09/20/2020 Treatment Recommendations Therapy as outlined in treatment plan below   Prognosis 08/23/2020 Prognosis for Safe Diet Advancement Good Barriers to Reach Goals --  Barriers/Prognosis Comment -- CHL IP DIET RECOMMENDATION 09/20/2020 SLP Diet Recommendations Regular solids;Thin liquid Liquid Administration via Cup;Straw Medication Administration Whole meds with liquid Compensations Slow rate;Small sips/bites Postural Changes Remain semi-upright after after feeds/meals (Comment);Seated upright at 90 degrees   CHL IP OTHER RECOMMENDATIONS 09/20/2020 Recommended Consults Other (Comment) Oral Care Recommendations Oral care BID Other Recommendations --   CHL IP FOLLOW UP RECOMMENDATIONS 09/20/2020 Follow up Recommendations None   CHL IP FREQUENCY AND DURATION 09/20/2020 Speech Therapy Frequency (ACUTE ONLY) min 1 x/week Treatment Duration 1 week      CHL IP ORAL PHASE 09/20/2020 Oral Phase WFL Oral - Pudding Teaspoon -- Oral - Pudding Cup -- Oral - Honey Teaspoon -- Oral - Honey Cup -- Oral - Nectar Teaspoon -- Oral - Nectar Cup -- Oral - Nectar Straw -- Oral - Thin Teaspoon -- Oral - Thin Cup -- Oral - Thin Straw -- Oral - Puree -- Oral - Mech Soft -- Oral - Regular -- Oral - Multi-Consistency -- Oral - Pill -- Oral Phase - Comment --  CHL IP PHARYNGEAL PHASE 09/20/2020 Pharyngeal Phase Impaired Pharyngeal- Pudding Teaspoon -- Pharyngeal -- Pharyngeal- Pudding Cup -- Pharyngeal -- Pharyngeal- Honey Teaspoon -- Pharyngeal -- Pharyngeal- Honey Cup -- Pharyngeal -- Pharyngeal- Nectar Teaspoon -- Pharyngeal -- Pharyngeal- Nectar Cup -- Pharyngeal -- Pharyngeal- Nectar Straw -- Pharyngeal -- Pharyngeal- Thin Teaspoon NT Pharyngeal -- Pharyngeal- Thin Cup WFL Pharyngeal -- Pharyngeal-  Thin Straw Reduced laryngeal elevation;Penetration/Aspiration during swallow Pharyngeal Material enters airway, remains ABOVE vocal cords and not ejected out Pharyngeal- Puree WFL Pharyngeal -- Pharyngeal- Mechanical Soft WFL Pharyngeal -- Pharyngeal- Regular -- Pharyngeal -- Pharyngeal- Multi-consistency -- Pharyngeal -- Pharyngeal- Pill WFL Pharyngeal -- Pharyngeal Comment --  CHL IP CERVICAL ESOPHAGEAL PHASE 09/20/2020 Cervical Esophageal Phase WFL Pudding Teaspoon -- Pudding Cup -- Honey Teaspoon -- Honey Cup -- Nectar Teaspoon -- Nectar Cup -- Nectar Straw -- Thin Teaspoon -- Thin Cup -- Thin Straw -- Puree -- Mechanical Soft -- Regular -- Multi-consistency -- Pill -- Cervical Esophageal Comment -- McCoy Leah Meryl 09/20/2020, 9:32 AM              ECHOCARDIOGRAM COMPLETE  Result Date: 09/21/2020    ECHOCARDIOGRAM REPORT   Patient Name:   KAZUMI LACHNEY [facility] Date of Exam: 09/21/2020 Medical Rec #:  [id number]         Height:       65.0 in Accession #:    [id number]        Weight:       139.6 lb Date of Birth:  10/04/1955          BSA:          1.698 m Patient Age:    75 years          BP:           103/66 mmHg Patient Gender: M                 HR:           95 bpm. Exam Location:  Inpatient Procedure: 2D Echo, 3D Echo, Cardiac Doppler and Color Doppler Indications:    Bacteremia  History:        Patient has prior history of Echocardiogram examinations, most                 recent 07/09/2019. Abnormal ECG, Arrythmias:Tachycardia,                 Signs/Symptoms:Bacteremia, Shortness of  Breath and Dyspnea; Risk                 Factors:Sleep Apnea, Hypertension and Diabetes. Cancer. Right                 pneumothorax. Subcutaneous air. Hypoxia.  Sonographer:    Sheralyn Boatman RDCS Referring Phys: [id number] PETER R ENNEVER  Sonographer Comments: Technically difficult study due to poor echo windows, suboptimal parasternal window, suboptimal apical window and suboptimal subcostal window. Patient has trach collar. Off aix medial  images in apical region. Feeding tube in subcostal region. IMPRESSIONS  1. Left ventricular ejection fraction, by estimation, is 60 to 65%. The left ventricle has normal function. The left ventricle has no regional wall motion abnormalities. There is mild left ventricular hypertrophy. Left ventricular diastolic parameters were normal.  2. Right ventricular systolic function is normal. The right ventricular size is moderately enlarged. There is normal pulmonary artery systolic pressure.  3. The mitral valve is normal in structure. No evidence of mitral valve regurgitation.  4. The aortic valve was not well visualized. Aortic valve regurgitation is not visualized. No aortic stenosis is present.  5. The inferior vena cava is normal in size with greater than 50% respiratory variability, suggesting right atrial pressure of 3 mmHg. Conclusion(s)/Recommendation(s): Techincally difficult study, but no vegetaion seen. If high clinical supicion for endocarditis, consider TEE. FINDINGS  Left Ventricle: Left ventricular ejection fraction, by estimation, is 60 to 65%. The left ventricle has normal function. The left ventricle has no regional wall motion abnormalities. The left ventricular internal cavity size was small. There is mild left ventricular hypertrophy. Left ventricular diastolic parameters were normal. Right Ventricle: The right ventricular size is moderately enlarged. Right vetricular wall thickness was not well visualized. Right ventricular systolic function is normal. There is normal pulmonary artery systolic pressure. The tricuspid regurgitant velocity is 2.78 m/s, and with an assumed right atrial pressure of 3 mmHg, the estimated right ventricular systolic pressure is 33.9 mmHg. Left Atrium: Left atrial size was normal in size. Right Atrium: Right atrial size was normal in size. Pericardium: There is no evidence of pericardial effusion. Mitral Valve: The mitral valve is normal in structure. No evidence of mitral  valve regurgitation. Tricuspid Valve: The tricuspid valve is normal in structure. Tricuspid valve regurgitation is trivial. Aortic Valve: The aortic valve was not well visualized. Aortic valve regurgitation is not visualized. No aortic stenosis is present. Pulmonic Valve: The pulmonic valve was not well visualized. Pulmonic valve regurgitation is not visualized. Aorta: The aortic root is normal in size and structure. Venous: The inferior vena cava is normal in size with greater than 50% respiratory variability, suggesting right atrial pressure of 3 mmHg. IAS/Shunts: The interatrial septum was not well visualized.  LEFT VENTRICLE PLAX 2D LVIDd:         3.60 cm      Diastology LVIDs:         2.30 cm      LV e' medial:    11.20 cm/s LV PW:         1.30 cm      LV E/e' medial:  8.5 LV IVS:        0.90 cm      LV e' lateral:   12.00 cm/s LVOT diam:     2.20 cm      LV E/e' lateral: 7.9 LV SV:         57 LV SV Index:   34 LVOT Area:  3.80 cm  LV Volumes (MOD) LV vol d, MOD A2C: 66.9 ml LV vol d, MOD A4C: 101.0 ml LV vol s, MOD A2C: 22.7 ml LV vol s, MOD A4C: 24.2 ml LV SV MOD A2C:     44.2 ml LV SV MOD A4C:     101.0 ml LV SV MOD BP:      78.9 ml RIGHT VENTRICLE             IVC RV S prime:     18.20 cm/s  IVC diam: 1.80 cm TAPSE (M-mode): 2.0 cm LEFT ATRIUM           Index       RIGHT ATRIUM           Index LA diam:      3.40 cm 2.00 cm/m  RA Area:     15.10 cm LA Vol (A2C): 13.3 ml 7.83 ml/m  RA Volume:   34.90 ml  20.56 ml/m LA Vol (A4C): 28.1 ml 16.55 ml/m  AORTIC VALVE LVOT Vmax:   126.00 cm/s LVOT Vmean:  71.400 cm/s LVOT VTI:    0.150 m  AORTA Ao Root diam: 3.40 cm Ao Asc diam:  3.70 cm MITRAL VALVE               TRICUSPID VALVE MV Area (PHT): 5.19 cm    TR Peak grad:   30.9 mmHg MV Decel Time: 146 msec    TR Vmax:        278.00 cm/s MV E velocity: 95.14 cm/s MV A velocity: 84.80 cm/s  SHUNTS MV E/A ratio:  1.12        Systemic VTI:  0.15 m                            Systemic Diam: 2.20 cm Oswaldo Milian MD Electronically signed by Oswaldo Milian MD Signature Date/Time: 09/21/2020/6:08:46 PM    Final    VAS [place] LOWER EXTREMITY VENOUS (DVT)  Result Date: 09/25/2020  Lower Venous DVT Study Patient Name:  THARUN CAPPELLA [facility]  Date of Exam:   09/24/2020 Medical Rec #: [id number]          Accession #:    [id number] Date of Birth: 01-28-1956           Patient Gender: M Patient Age:   64Y Exam Location:  [facility] Procedure:      VAS [place] LOWER EXTREMITY VENOUS (DVT) Referring Phys: [id number] MURALI RAMASWAMY --------------------------------------------------------------------------------  Indications: Post-Covid severe pulmonary fibrosis, relapsed Hodkin's lymphoma, edema.  Comparison       07-09-2019 Bilateral lower extremity venous was negative for Study:           DVT. Performing Technologist: Darlin Coco RDMS,RVT  Examination Guidelines: A complete evaluation includes B-mode imaging, spectral Doppler, color Doppler, and power Doppler as needed of all accessible portions of each vessel. Bilateral testing is considered an integral part of a complete examination. Limited examinations for reoccurring indications may be performed as noted. The reflux portion of the exam is performed with the patient in reverse Trendelenburg.  +---------+---------------+---------+-----------+----------+--------------+ RIGHT    CompressibilityPhasicitySpontaneityPropertiesThrombus Aging +---------+---------------+---------+-----------+----------+--------------+ CFV      Full           Yes      Yes                                 +---------+---------------+---------+-----------+----------+--------------+  SFJ      Full                                                        +---------+---------------+---------+-----------+----------+--------------+ FV Prox  Full                                                        +---------+---------------+---------+-----------+----------+--------------+ FV Mid    Full                                                        +---------+---------------+---------+-----------+----------+--------------+ FV DistalFull                                                        +---------+---------------+---------+-----------+----------+--------------+ PFV      Full                                                        +---------+---------------+---------+-----------+----------+--------------+ POP      Full           Yes      Yes                                 +---------+---------------+---------+-----------+----------+--------------+ PTV      Full                                                        +---------+---------------+---------+-----------+----------+--------------+ PERO     Full                                                        +---------+---------------+---------+-----------+----------+--------------+   +---------+---------------+---------+-----------+----------+--------------+ LEFT     CompressibilityPhasicitySpontaneityPropertiesThrombus Aging +---------+---------------+---------+-----------+----------+--------------+ CFV      Full           Yes      Yes                                 +---------+---------------+---------+-----------+----------+--------------+ SFJ      Full                                                        +---------+---------------+---------+-----------+----------+--------------+  FV Prox  Full                                                        +---------+---------------+---------+-----------+----------+--------------+ FV Mid   Full                                                        +---------+---------------+---------+-----------+----------+--------------+ FV DistalFull                                                        +---------+---------------+---------+-----------+----------+--------------+ PFV      Full                                                         +---------+---------------+---------+-----------+----------+--------------+ POP      Full           Yes      Yes                                 +---------+---------------+---------+-----------+----------+--------------+ PTV      Full                                                        +---------+---------------+---------+-----------+----------+--------------+ PERO     Full                                                        +---------+---------------+---------+-----------+----------+--------------+     Summary: RIGHT: - There is no evidence of deep vein thrombosis in the lower extremity.  - No cystic structure found in the popliteal fossa.  - Ultrasound characteristics of enlarged lymph nodes are noted in the groin.  LEFT: - There is no evidence of deep vein thrombosis in the lower extremity.  - No cystic structure found in the popliteal fossa.  *See table(s) above for measurements and observations. Electronically signed by Harold Barban MD on 09/25/2020 at 10:25:35 PM.    Final    [place] EKG SITE RITE  Result Date: 10/06/2020 If Site Rite image not attached, placement could not be confirmed due to current cardiac rhythm.  US Abdomen Limited RUQ (LIVER/GB)  Result Date: 09/19/2020 CLINICAL DATA:  Transaminitis. EXAM: ULTRASOUND ABDOMEN LIMITED RIGHT UPPER QUADRANT COMPARISON:  None. FINDINGS: Gallbladder: No gallbladder wall thickening. Small amount of biliary sludge. No sonographic Murphy sign noted by sonographer. Common bile duct: Diameter: 3 mm, normal Liver: No focal lesion identified. Within normal limits in  parenchymal echogenicity. Portal vein is patent on color Doppler imaging with normal direction of blood flow towards the liver. Other: None. IMPRESSION: Biliary sludge without evidence of acute cholecystitis. Electronically Signed   By: Valentino Saxon MD   On: 09/19/2020 16:18    Lab Data:  CBC: Recent Labs  Lab 10/01/20 0749 10/02/20 0704  10/03/20 0317 10/04/20 0247 10/06/20 0237  WBC 15.9* 15.9* 12.5* 15.9* 13.1*  HGB 11.0* 10.4* 10.0* 10.5* 11.2*  HCT 35.8* 34.9* 34.3* 36.2* 39.1  MCV 96.8 97.8 99.1 99.5 100.0  PLT 216 224 254 248 229   Basic Metabolic Panel: Recent Labs  Lab 10/01/20 0749 10/02/20 0704 10/03/20 0317 10/04/20 0247 10/04/20 1005 10/05/20 0251 10/06/20 0237 10/07/20 0235  NA 142 143  143 143 143 142 142 142  144 141  K 4.0 3.8  3.7 3.6 4.2 4.8 4.0 4.7  4.8 4.6  CL 93* 94*  93* 94* 92* 89* 92* 92*  91* 89*  CO2 42* 43*  43* 45* 45* >50* 46* 45*  45* 46*  GLUCOSE 100* 99  98 116* 81 142* 116* 86  85 93  BUN 26* 26*  26* 27* 26* 25* 27* 27*  27* 25*  CREATININE 0.49* 0.43*  0.52* 0.52*  0.52* 0.58* 0.63 0.57* 0.52*  0.59* 0.47*  CALCIUM 9.4 9.3  9.3 9.3 9.5 9.7 10.1 10.5*  10.5* 10.1  MG 2.1 1.9  --   --   --   --  2.4  --   PHOS 3.6 3.3  3.3 3.4 3.9  --  5.3* 4.2  4.2 3.6   GFR: Estimated Creatinine Clearance: 81.1 mL/min (A) (by C-G formula based on SCr of 0.47 mg/dL (L)). Liver Function Tests: Recent Labs  Lab 10/03/20 0317 10/04/20 0247 10/05/20 0251 10/06/20 0237 10/07/20 0235  ALBUMIN 1.9* 2.2* 2.4* 2.6* 2.7*   No results for input(s): LIPASE, AMYLASE in the last 168 hours. No results for input(s): AMMONIA in the last 168 hours. Coagulation Profile: Recent Labs  Lab 10/04/20 1005  INR 1.0   Cardiac Enzymes: No results for input(s): CKTOTAL, CKMB, CKMBINDEX, TROPONINI in the last 168 hours. BNP (last 3 results) No results for input(s): PROBNP in the last 8760 hours. HbA1C: No results for input(s): HGBA1C in the last 72 hours. CBG: Recent Labs  Lab 10/06/20 1924 10/06/20 2309 10/07/20 0321 10/07/20 0756 10/07/20 1148  GLUCAP 121* 94 83 119* 201*   Lipid Profile: No results for input(s): CHOL, HDL, LDLCALC, TRIG, CHOLHDL, LDLDIRECT in the last 72 hours. Thyroid Function Tests: No results for input(s): TSH, T4TOTAL, FREET4, T3FREE, THYROIDAB in  the last 72 hours. Anemia Panel: No results for input(s): VITAMINB12, FOLATE, FERRITIN, TIBC, IRON, RETICCTPCT in the last 72 hours. Urine analysis:    Component Value Date/Time   COLORURINE YELLOW 09/19/2020 1511   APPEARANCEUR HAZY (A) 09/19/2020 1511   LABSPEC 1.012 09/19/2020 1511   PHURINE 7.0 09/19/2020 1511   GLUCOSEU NEGATIVE 09/19/2020 1511   HGBUR NEGATIVE 09/19/2020 1511   HGBUR negative 03/23/2007 0000   BILIRUBINUR NEGATIVE 09/19/2020 1511   BILIRUBINUR neg 11/11/2016 1017   KETONESUR NEGATIVE 09/19/2020 1511   PROTEINUR NEGATIVE 09/19/2020 1511   UROBILINOGEN 0.2 11/11/2016 1017   UROBILINOGEN 0.2 11/08/2012 0915   NITRITE NEGATIVE 09/19/2020 1511   LEUKOCYTESUR NEGATIVE 09/19/2020 1511     Jonell Brumbaugh M.D. [facility] 10/07/2020, 11:56 AM  Available via Epic secure chat 7am-7pm After 7 pm, please refer to night coverage provider listed on amion.

## 2020-10-08 DIAGNOSIS — R7881 Bacteremia: Secondary | ICD-10-CM | POA: Diagnosis not present

## 2020-10-08 DIAGNOSIS — R627 Adult failure to thrive: Secondary | ICD-10-CM | POA: Diagnosis not present

## 2020-10-08 DIAGNOSIS — J9611 Chronic respiratory failure with hypoxia: Secondary | ICD-10-CM | POA: Diagnosis not present

## 2020-10-08 DIAGNOSIS — G620 Drug-induced polyneuropathy: Secondary | ICD-10-CM | POA: Diagnosis not present

## 2020-10-08 LAB — GLUCOSE, CAPILLARY
Glucose-Capillary: 115 mg/dL — ABNORMAL HIGH (ref 70–99)
Glucose-Capillary: 134 mg/dL — ABNORMAL HIGH (ref 70–99)
Glucose-Capillary: 140 mg/dL — ABNORMAL HIGH (ref 70–99)
Glucose-Capillary: 154 mg/dL — ABNORMAL HIGH (ref 70–99)
Glucose-Capillary: 77 mg/dL (ref 70–99)
Glucose-Capillary: 87 mg/dL (ref 70–99)
Glucose-Capillary: 90 mg/dL (ref 70–99)

## 2020-10-08 LAB — RENAL FUNCTION PANEL
Albumin: 2.7 g/dL — ABNORMAL LOW (ref 3.5–5.0)
Anion gap: 6 (ref 5–15)
BUN: 28 mg/dL — ABNORMAL HIGH (ref 8–23)
CO2: 46 mmol/L — ABNORMAL HIGH (ref 22–32)
Calcium: 10.1 mg/dL (ref 8.9–10.3)
Chloride: 89 mmol/L — ABNORMAL LOW (ref 98–111)
Creatinine, Ser: 0.49 mg/dL — ABNORMAL LOW (ref 0.61–1.24)
GFR, Estimated: >60 mL/min (ref >60)
Glucose, Bld: 99 mg/dL (ref 70–99)
Phosphorus: 3.4 mg/dL (ref 2.5–4.6)
Potassium: 4.4 mmol/L (ref 3.5–5.1)
Sodium: 141 mmol/L (ref 135–145)

## 2020-10-08 MED ORDER — INSULIN GLARGINE 100 UNIT/ML ~~LOC~~ SOLN
10.0000 [IU] | Freq: Every day | SUBCUTANEOUS | Status: DC
  Administered 2020-10-08 – 2020-10-26 (×19): 10 [IU] via SUBCUTANEOUS
  Filled 2020-10-08 (×19): qty 0.1

## 2020-10-08 MED ORDER — INSULIN ASPART 100 UNIT/ML IJ SOLN
0.0000 [IU] | INTRAMUSCULAR | Status: DC
  Administered 2020-10-08: 1 [IU] via SUBCUTANEOUS
  Administered 2020-10-08: 2 [IU] via SUBCUTANEOUS
  Administered 2020-10-09 (×3): 1 [IU] via SUBCUTANEOUS
  Administered 2020-10-09: 2 [IU] via SUBCUTANEOUS
  Administered 2020-10-09 – 2020-10-10 (×4): 1 [IU] via SUBCUTANEOUS
  Administered 2020-10-10: 2 [IU] via SUBCUTANEOUS
  Administered 2020-10-10 – 2020-10-11 (×4): 1 [IU] via SUBCUTANEOUS
  Administered 2020-10-11: 2 [IU] via SUBCUTANEOUS
  Administered 2020-10-11: 1 [IU] via SUBCUTANEOUS
  Administered 2020-10-12: 2 [IU] via SUBCUTANEOUS
  Administered 2020-10-12 – 2020-10-13 (×8): 1 [IU] via SUBCUTANEOUS
  Administered 2020-10-14: 2 [IU] via SUBCUTANEOUS
  Administered 2020-10-14 (×4): 1 [IU] via SUBCUTANEOUS
  Administered 2020-10-15 (×2): 2 [IU] via SUBCUTANEOUS
  Administered 2020-10-15 – 2020-10-16 (×3): 1 [IU] via SUBCUTANEOUS
  Administered 2020-10-16: 2 [IU] via SUBCUTANEOUS
  Administered 2020-10-16 – 2020-10-17 (×5): 1 [IU] via SUBCUTANEOUS
  Administered 2020-10-17 – 2020-10-18 (×3): 2 [IU] via SUBCUTANEOUS
  Administered 2020-10-18: 1 [IU] via SUBCUTANEOUS
  Administered 2020-10-18: 2 [IU] via SUBCUTANEOUS
  Administered 2020-10-18 – 2020-10-19 (×2): 1 [IU] via SUBCUTANEOUS
  Administered 2020-10-19: 2 [IU] via SUBCUTANEOUS
  Administered 2020-10-19 – 2020-10-20 (×2): 1 [IU] via SUBCUTANEOUS
  Administered 2020-10-20: 2 [IU] via SUBCUTANEOUS
  Administered 2020-10-20 – 2020-10-21 (×4): 1 [IU] via SUBCUTANEOUS
  Administered 2020-10-21 – 2020-10-22 (×4): 2 [IU] via SUBCUTANEOUS
  Administered 2020-10-22 – 2020-10-23 (×2): 1 [IU] via SUBCUTANEOUS
  Administered 2020-10-23: 2 [IU] via SUBCUTANEOUS
  Administered 2020-10-23 – 2020-10-24 (×5): 1 [IU] via SUBCUTANEOUS
  Administered 2020-10-25: 2 [IU] via SUBCUTANEOUS
  Administered 2020-10-25 – 2020-10-26 (×7): 1 [IU] via SUBCUTANEOUS

## 2020-10-08 NOTE — Progress Notes (Signed)
[facility]                                                                              Patient Demographics  Steve [name], is a 65 y.o. male, DOB - Dec 11, 1955, MQK:[id number]  Admit date - 09/19/2020   Admitting Physician Bonnell Public, MD  Outpatient Primary MD for the patient is Saguier, [name]  Outpatient specialists:   LOS - 19  days   Medical records reviewed and are as summarized below:    Chief Complaint  Patient presents with   Hyperkalemia       Brief summary   Briefly, 65 years old male with PMH significant for [id number] infection complicated by ARDS, pulmonary fibrosis / pneumonitis and chronic respiratory failure on 8 L via trach, PEG dependence, Hodgkin's lymphoma on chemo, type 2 diabetes, hypertension and GERD presented in the ED with generalized malaise, poor appetite and failure to thrive and was admitted for possible pneumonia and found to have a staph epidermidis bacteremia.  First negative blood cultures 09/22/20.  0n 09/24/20, patient developed respiratory distress with increased oxygen requirement and was transferred to ICU and started on pressure support ventilation.  Critical care and ID consulted.   ID recommended TEE to rule out infective endocarditis, however not stable to have TEE at this time per cardiology.    ID recommended 6 weeks of IV antibiotics from first negative blood cultures on 09/22/20, if unable to have TEE.  Cardiology was reconsulted to reassess if patient is stable for TEE on 6/28 however still having descending episodes and unstable for TEE..     Assessment & Plan    Principal Problem: Sepsis due to pneumonia, staph epidermidis bacteremia: Present on admission -Met sepsis criteria on presentation with tachycardia, tachypnea, leukocytosis, chest x-ray with possible recurrent pneumonia -CT chest with interval increase in bulk of mediastinal and axillary adenopathy. -Blood cultures grew staph  epidermidis, Port-A-Cath was removed on 6/16. -Patient remained on cefepime from 6/11-6/15.  ID recommended TEE to exclude endocarditis, however patient has been unstable for TEE -Continue midodrine for hypotension, stress dose steroids tapered off on 6/24 -ID recommended vancomycin for minimum 6 weeks from date of negative culture on 6/14, will need 4 more weeks upon discharge (if TEE not done). 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 -Stress dose steroids have been tapered off  Chronic metabolic alkalosis -Bicarb remaining stable 45 -46, follow closely, may need Diamox   Relapsed Hodgkin lymphoma -CT chest concerning for worsening mediastinal and axillary lymphadenopathy -Oncology and palliative medicine following -Seen by Dr. Marin Olp on 6/20, PICC line has been placed  Diabetes mellitus type 2, NIDDM with hyperglycemia, uncontrolled -Now having hypoglycemia, has been off the steroids.  Decreased sliding  scale insulin to sensitive -Discontinued NovoLog meal coverage, decrease Levemir to 10 units at bedtime   Normocytic anemia, anemia of chronic disease -H&H stable on 6/28, will recheck in a.m.  Hyponatremia, hypokalemia -Resolved   Pressure injury -Sacrum mid stage I -Wound care per RN  Severe malnutrition secondary to chronic illness, malignancy -Continue tube feeds -  Estimated body mass index is 25.31 kg/m as calculated from the following:   Height as of this encounter: 5' 5"  (1.651 m).   Weight as of  this encounter: 69 kg.  Code Status: full  DVT Prophylaxis:  enoxaparin (LOVENOX) injection 40 mg Start: 09/19/20 2200   Level of Care: Level of care: ICU Family Communication: Discussed all imaging results, lab results, explained to the patient. Called patient's wife on phone [phone], unable to make contact, will try again.   Disposition Plan:     Status is: Inpatient  Remains inpatient appropriate because:Inpatient level of care appropriate due to severity of illness  Dispo: The patient is from: Home              Anticipated d/c is to: Home              Patient currently is not medically stable to d/c.  Hopefully will DC to home with home health for IV antibiotics once respiratory status close to his baseline   Difficult to place patient No      Time Spent in minutes   63mns  Procedures:  Mech vent   Consultants:   Pulmonary critical care Oncology ID Palliative medicine    Antimicrobials:   Anti-infectives (From admission, onward)    Start     Dose/Rate Route Frequency Ordered Stop   09/23/20 0800  ceFEPIme (MAXIPIME) 2 g in sodium chloride 0.9 % 100 mL IVPB  Status:  Discontinued        2 g 200 mL/hr over 30 Minutes Intravenous Every 8 hours 09/23/20 0738 09/23/20 1758   09/21/20 0400  vancomycin (VANCOREADY) IVPB 750 mg/150 mL        750 mg 150 mL/hr over 60 Minutes Intravenous Every 12 hours 09/20/20 1715 11/03/20 2359   09/20/20 1730  vancomycin (VANCOREADY) IVPB 750 mg/150 mL        750 mg 150 mL/hr over 60 Minutes Intravenous STAT 09/20/20 1714 09/20/20 1954   09/20/20 0400  vancomycin (VANCOREADY) IVPB 750 mg/150 mL  Status:  Discontinued        750 mg 150 mL/hr over 60 Minutes Intravenous Every 12 hours 09/19/20 1932 09/20/20 1155   09/20/20 0000  ceFEPIme (MAXIPIME) 2 g in sodium chloride 0.9 % 100 mL IVPB  Status:  Discontinued        2 g 200 mL/hr over 30 Minutes Intravenous Every 8 hours 09/19/20 1932 09/23/20 0721   09/19/20 1600  ceFEPIme  (MAXIPIME) 2 g in sodium chloride 0.9 % 100 mL IVPB        2 g 200 mL/hr over 30 Minutes Intravenous  Once 09/19/20 1548 09/19/20 1634   09/19/20 1600  vancomycin (VANCOREADY) IVPB 1250 mg/250 mL        1,250 mg 166.7 mL/hr over 90 Minutes Intravenous STAT 09/19/20 1557 09/19/20 1822          Medications  Scheduled Meds:  chlorhexidine gluconate (MEDLINE KIT)  15 mL Mouth Rinse BID   Chlorhexidine Gluconate Cloth  6 each Topical Q0600   cholecalciferol  10,000 Units Oral Daily   enoxaparin (LOVENOX) injection  40 mg Subcutaneous  Q24H   free water  100 mL Per Tube Q6H   insulin aspart  0-9 Units Subcutaneous Q4H   insulin glargine  10 Units Subcutaneous QHS   mouth rinse  15 mL Mouth Rinse BID   midodrine  5 mg Per Tube TID WC   multivitamin  15 mL Per Tube Daily   pantoprazole sodium  40 mg Per Tube Daily   sertraline  50 mg Per Tube Daily   sodium chloride flush  10-40 mL Intracatheter Q12H   traZODone  100 mg Per Tube QHS   Continuous Infusions:  sodium chloride 20 mL/hr at 10/04/20 1343   feeding supplement (VITAL AF 1.2 CAL) 1,000 mL (10/07/20 1844)   vancomycin Stopped (10/08/20 0442)   PRN Meds:.albuterol, guaiFENesin, hyoscyamine, LORazepam, sodium chloride flush      Subjective:   Steve [name] was seen and examined today.  Slowly weaning O2, on 60% FiO2 at the time of my examination.  No fevers overnight.  No acute issues overnight.  Patient denies dizziness, abdominal pain, N/V.    Objective:   Vitals:   10/08/20 0800 10/08/20 1100 10/08/20 1200 10/08/20 1220  BP: (!) 114/53 130/62  130/62  Pulse: 85 85  85  Resp:  20  20  Temp: 98.3 F (36.8 C)  98.1 F (36.7 C)   TempSrc: Oral  Oral   SpO2: 97%   92%  Weight:      Height:        Intake/Output Summary (Last 24 hours) at 10/08/2020 1415 Last data filed at 10/08/2020 0800 Gross per 24 hour  Intake 2047.67 ml  Output 1690 ml  Net 357.67 ml     Wt Readings from Last 3 Encounters:   09/30/20 69 kg  09/18/20 64.4 kg  09/08/20 64.4 kg   Physical Exam General: Alert and awake, chronically ill-appearing, trach in place Cardiovascular: S1 S2 clear, RRR. No pedal edema b/l Respiratory: Bilateral scattered rhonchi Gastrointestinal: Soft, nontender, nondistended, NBS Ext: no pedal edema bilaterally Neuro: no new deficits Psych: flat affect    Data Reviewed:  I have personally reviewed following labs and imaging studies  Micro Results No results found for this or any previous visit (from the past 240 hour(s)).  Radiology Reports DG Chest 2 View  Result Date: 09/18/2020 CLINICAL DATA:  Acute on chronic respiratory failure. EXAM: CHEST - 2 VIEW COMPARISON:  Aug 15, 2020. FINDINGS: Stable cardiomegaly. Tracheostomy tube is in good position. Left-sided Port-A-Cath is unchanged. Stable bilateral lung opacities are noted concerning for multifocal pneumonia or chronic scarring. No pneumothorax or pleural effusion is noted. Bony thorax is unremarkable. IMPRESSION: Stable bilateral lung opacities as described above. Electronically Signed   By: Marijo Conception M.D.   On: 09/18/2020 12:59   CT CHEST WO CONTRAST  Result Date: 09/20/2020 CLINICAL DATA:  Respiratory failure.  History of Hodgkin's lymphoma. EXAM: CT CHEST WITHOUT CONTRAST TECHNIQUE: Multidetector CT imaging of the chest was performed following the standard protocol without IV contrast. COMPARISON:  July 21, 2020 FINDINGS: Cardiovascular: Heart is mildly enlarged. LEFT chest port tip terminates at the superior cavoatrial junction. No pericardial effusion. Minimal atherosclerotic calcifications. Enlarged pulmonary artery as Steve be seen in pulmonary arterial hypertension. Mediastinum/Nodes: Revisualization of extensive adenopathy. LEFT level 3 axillary lymph node measures 11 mm, previously 3 mm (series 2, image 42). LEFT supraclavicular lymph node measures 15 mm, previously 15 mm (series 2, image 24). RIGHT level 1 axillary  lymph node measures 8 mm, previously 4 mm (series  2, image 63). RIGHT internal mammary lymph node measures 31 mm, previously 26 mm (series 2, image 77). RIGHT paratracheal conglomeration spanned 59 x 43 mm, previously 44 by 30 mm (series 2, image 45). There is a lunate configuration of the trachea. Lungs/Pleura: Visualization of a peripheral rind of fluid adjacent to the RIGHT lung, similar in comparison to prior. Extensive bilateral ground-glass opacities with subpleural reticulation and suggestion of honeycombing. Peribronchovascular nodularity is similar in comparison to prior. Similar appearance of RIGHT middle lobe atelectasis. Upper Abdomen: No acute abnormality. Musculoskeletal: Known osseous metastatic disease is not well visualized. Limited evaluation of the ribs secondary to respiratory motion. No acute thoracic spine fracture. IMPRESSION: 1. Interval increase in bulky adenopathy within the mediastinum consistent with known lymphoma. Increased axillary lymph node size in comparison to prior. 2. Revisualization of sequela of advanced pulmonary fibrosis. Unchanged appearance of peribronchovascular nodularity. This could reflect lymphangitic spread of lymphoma versus underlying infection. 3. Unchanged small rind of RIGHT-sided pleural fluid. Underlying pleural nodularity from lymphoma remains in the differential. 4. 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 5. Enlarged pulmonary artery as Steve be seen in pulmonary arterial hypertension. Electronically Signed   By: Valentino Saxon MD   On: 09/20/2020 17:25   CT Angio Chest Pulmonary Embolism (PE) W or WO Contrast  Result Date: 09/25/2020 CLINICAL DATA:  Cough and hypoxia.  COVID-19. EXAM: CT ANGIOGRAPHY CHEST WITH CONTRAST TECHNIQUE: Multidetector CT imaging of the chest was performed using the standard protocol during bolus administration of intravenous contrast. Multiplanar CT image reconstructions and MIPs  were obtained to evaluate the vascular anatomy. CONTRAST:  65m OMNIPAQUE IOHEXOL 350 MG/ML SOLN COMPARISON:  09/20/2020 FINDINGS: Cardiovascular: Contrast injection is sufficient to demonstrate satisfactory opacification of the pulmonary arteries to the segmental level. There is no pulmonary embolus or evidence of right heart strain. The size of the main pulmonary artery is normal. Heart size is normal, with no pericardial effusion. The course and caliber of the aorta are normal. There is no atherosclerotic calcification. No acute aortic syndrome. Mediastinum/Nodes: Bulky mediastinal adenopathy is unchanged since the recent study of 09/20/2020. Lungs/Pleura: Tracheostomy tube tip is at the level of the clavicular heads. There is increased pleural fluid on the right. Diffuse pleural thickening is unchanged. Unchanged appearance of diffuse parenchymal interstitial abnormality. Upper Abdomen: Contrast bolus timing is not optimized for evaluation of the abdominal organs. The visualized portions of the organs of the upper abdomen are normal. Musculoskeletal: No chest wall abnormality. No bony spinal canal stenosis. Review of the MIP images confirms the above findings. IMPRESSION: 1. No pulmonary embolus or acute aortic syndrome. 2. Bulky mediastinal adenopathy and diffuse pleural, interstitial and parenchymal pulmonary abnormalities are unchanged since 09/20/2020. Electronically Signed   By: KUlyses JarredM.D.   On: 09/25/2020 02:54   IR REMOVAL TUN ACCESS W/ PORT W/O FL MOD SED  Result Date: 09/22/2020 INDICATION: 65year old male with a history of Hodgkin lymphoma currently presenting with bacteremia associated with pneumonia. He has a left chest port catheter which was placed by interventional radiology (Dr. SAnnamaria Boots in December of 2020. he presents for port catheter removal to help facilitate clearance of bacteremia. EXAM: REMOVAL RIGHT IJ VEIN PORT-A-CATH MEDICATIONS: None ANESTHESIA/SEDATION: Moderate  (conscious) sedation was employed during this procedure. A total of Versed 1 mg and Fentanyl 50 mcg was administered intravenously. Moderate Sedation Time: 12 minutes. The patient's level of consciousness and vital signs were monitored continuously by radiology nursing throughout the procedure under my direct  supervision. FLUOROSCOPY TIME:  None. COMPLICATIONS: None immediate. PROCEDURE: Informed written consent was obtained from the patient after a thorough discussion of the procedural risks, benefits and alternatives. All questions were addressed. Maximal Sterile Barrier Technique was utilized including caps, mask, sterile gowns, sterile gloves, sterile drape, hand hygiene and skin antiseptic. A timeout was performed prior to the initiation of the procedure. The right chest was prepped and draped in a sterile fashion. Lidocaine was utilized for local anesthesia. An incision was made over the previously healed surgical incision. Utilizing blunt dissection, the port catheter and reservoir were removed from the underlying subcutaneous tissue in their entirety. Securing sutures were also removed. The pocket was irrigated with a copious amount of sterile normal saline. The wound was examined. No evidence of purulence. The pocket was closed with interrupted 3-0 Vicryl stitches. The subcutaneous tissue was closed with 3-0 Vicryl interrupted subcutaneous stitches. Dermabond was applied. IMPRESSION: Successful right IJ vein Port-A-Cath explant. Electronically Signed   By: Jacqulynn Cadet M.D.   On: 09/22/2020 16:18   DG CHEST PORT 1 VIEW  Result Date: 09/24/2020 CLINICAL DATA:  Respiratory distress EXAM: PORTABLE CHEST 1 VIEW COMPARISON:  09/19/2020 FINDINGS: Lung volumes are small with elevation of the right hemidiaphragm, stable since prior examination. Superimposed multifocal pulmonary infiltrates likely related to chronic underlying interstitial lung disease appears stable, better assessed on CT examination of  09/20/2020. Superimposed right pleural thickening is again noted. Tracheostomy unchanged. Cardiac size is enlarged with associated superior mediastinal widening again noted related to extensive mediastinal and pericardial adenopathy better demonstrated on prior CT examination. No acute bone abnormality. IMPRESSION: Stable diffuse pulmonary infiltrates likely related to chronic underlying interstitial lung disease and asymmetric right pleural thickening. Stable enlargement of the cardiomediastinal silhouette related to extensive mediastinal and pericardial adenopathy better appreciated on prior CT examination. Electronically Signed   By: Fidela Salisbury MD   On: 09/24/2020 11:41   DG Chest Port 1 View  Result Date: 09/19/2020 CLINICAL DATA:  Shortness of breath. EXAM: PORTABLE CHEST 1 VIEW COMPARISON:  Chest x-ray dated September 18, 2020. FINDINGS: Unchanged tracheostomy tube and left chest wall port catheter. Stable cardiomegaly and mediastinal widening related to underlying lymphadenopathy. Unchanged diffuse patchy nodular opacities throughout both lungs superimposed on chronic pulmonary fibrosis. Unchanged small right pleural effusion. No pneumothorax. No acute osseous abnormality. IMPRESSION: 1. Unchanged multifocal pneumonia superimposed on chronic severe pulmonary fibrosis. 2. Unchanged mediastinal lymphadenopathy. Electronically Signed   By: Titus Dubin M.D.   On: 09/19/2020 15:39   DG Swallowing Func-Speech Pathology  Result Date: 09/20/2020 Formatting of this result is different from the original. Objective Swallowing Evaluation: Type of Study: MBS-Modified Barium Swallow Study  Patient Details Name: KALANI STHILAIRE MRN: [id number] Date of Birth: 28-Feb-1956 Today's Date: 09/20/2020 Time: SLP Start Time (ACUTE ONLY): 0903 -SLP Stop Time (ACUTE ONLY): 8366 SLP Time Calculation (min) (ACUTE ONLY): 20 min Past Medical History: Past Medical History: Diagnosis Date  Abscess of muscle 08/10/2011  staph infection  of right hip   Acute on chronic respiratory failure with hypoxia ([name])   Acute respiratory distress syndrome (ARDS) due to COVID-19 virus ([place])   Anxiety   Diabetes mellitus without complication ([name])   GERD (gastroesophageal reflux disease)   rare reflux - no meds for reflux - NO PROBLEM IN PAST SEVERAL YRS  HCAP (healthcare-associated pneumonia)   Hip dysplasia, congenital   no surgery as a child for hip dysplasia - has had bilateral hip replacements as an adult  Hodgkin lymphoma ([place])  Hypertension   Pancreatitis   Pneumothorax, acute   Postoperative anemia due to acute blood loss 09/07/2012  Septic arthritis of hip ([place]) 09/05/2012  PT'S TOTAL HIP JOINT REMOVED - ANTIBIOTIC SPACE PLACED AND PT HAS FINISHED IV ANTIBIOTICS ( PICC LINE REMOVED)  Sleep apnea   USES CPAP Past Surgical History: Past Surgical History: Procedure Laterality Date  COLONOSCOPY  04/26/2007  HERNIA REPAIR    inguinal hernia x3  IR GASTROSTOMY TUBE MOD SED  08/22/2019  IR IMAGING GUIDED PORT INSERTION  03/29/2019  JOINT REPLACEMENT  2002 & 2007  bilateral hip replacement  LYMPH NODE BIOPSY Right 03/20/2019  Procedure: DEEP RIGHT SUPRACLAVICULAR LYMPH NODE EXCISION;  Surgeon: Fanny Skates, MD;  Location: [place];  Service: General;  Laterality: Right;  MULTIPLE EXTRACTIONS WITH ALVEOLOPLASTY  07/28/2011  Procedure: MULTIPLE EXTRACION WITH ALVEOLOPLASTY;  Surgeon: Lenn Cal, DDS;  Location: [facility];  Service: Oral Surgery;  Laterality: N/A;  Extraction of tooth #'s 2,3,4,5,6,11,12,13,15,19,22 with alveoloplasty.  shoulder repair - right for separation of shoulder    TEE WITHOUT CARDIOVERSION  07/29/2011  Procedure: TRANSESOPHAGEAL ECHOCARDIOGRAM (TEE);  Surgeon: Josue Hector, MD;  Location: [place];  Service: Cardiovascular;  Laterality: N/A;  TOTAL HIP REVISION Right 09/05/2012  Procedure: RIGHT HIP RESECTION ARTHROPLASTY WITH ANTIBIOTIC SPACERS;  Surgeon: [name], MD;  Location: [facility];  Service:  Orthopedics;  Laterality: Right;  TOTAL HIP REVISION Right 11/30/2012  Procedure: RIGHT TOTAL HIP ARTHROPLASTY REIMPLANTATION;  Surgeon: [name], MD;  Location: [facility];  Service: Orthopedics;  Laterality: Right; HPI: Patient is a 65 year old Caucasian  male with medical history significant of chronic tracheostomy after prolonged hospitalization in 2021 related with pneumonia due to DTHYH-88 complicated by ARDS and pulmonary fibrosis +/-pneumonitis from brentuximab, s/p PEG, HTN, DM type II, Hodgkin's lymphoma on chemotherapy and GERD.  Patient was discharged from this hospital on 08/25/2020.  Patient was said to have done very well for the first 3 weeks.  Over the last 5 days, patient has been failing to thrive, with associated poor appetite and malaise.  Readmitted with unchanged multifocal PNA.  Subjective: pt alert, eager to go home Assessment / Plan / Recommendation CHL IP CLINICAL IMPRESSIONS 09/20/2020 Clinical Impression Patient presents with excellent oropharyngeal swallowing function. Oral phase timely and with full clearance of bolus. Pharyngeally, strength has improved from previous MBS 03/2020 in that patient has no pharyngeal residue post swallow. Intermittent trace penetration of thin liquids noted, primarily via straw, due to mildly decreased laryngeal closure noted, remaining above the vocal cords and clearing with either subsequent dry swallow or cued throat clear. Tested all consistencies both with and without PMSV in place with no notable difference in swallowing function. Educated patient and spouse (via phone per patient request) on results of testing and recommendations which include use of PMV if able (not necessary) to faciliate improved strength of cough/throat clear if needed. Both verbalized understanding. SLP will f/u briefly for tolerance and continued education as needed. Do not suspect that dysphagia is resulting in recurrent PNAs. Cannot however r/o contribution of PEG tube  feedings on aspiration, particularly because patient is also taking pos by mouth in conjunction with tube feeds, and complains of feeling full often. Recommend dietary consult to ensure that patient is being fed via tube the correct amount and in the correct manner based on how much he consuming by mouth. SLP Visit Diagnosis Dysphagia, pharyngeal phase (R13.13) Attention and concentration deficit following -- Frontal lobe and executive function  deficit following -- Impact on safety and function Mild aspiration risk   CHL IP TREATMENT RECOMMENDATION 09/20/2020 Treatment Recommendations Therapy as outlined in treatment plan below   Prognosis 08/23/2020 Prognosis for Safe Diet Advancement Good Barriers to Reach Goals -- Barriers/Prognosis Comment -- CHL IP DIET RECOMMENDATION 09/20/2020 SLP Diet Recommendations Regular solids;Thin liquid Liquid Administration via Cup;Straw Medication Administration Whole meds with liquid Compensations Slow rate;Small sips/bites Postural Changes Remain semi-upright after after feeds/meals (Comment);Seated upright at 90 degrees   CHL IP OTHER RECOMMENDATIONS 09/20/2020 Recommended Consults Other (Comment) Oral Care Recommendations Oral care BID Other Recommendations --   CHL IP FOLLOW UP RECOMMENDATIONS 09/20/2020 Follow up Recommendations None   CHL IP FREQUENCY AND DURATION 09/20/2020 Speech Therapy Frequency (ACUTE ONLY) min 1 x/week Treatment Duration 1 week      CHL IP ORAL PHASE 09/20/2020 Oral Phase WFL Oral - Pudding Teaspoon -- Oral - Pudding Cup -- Oral - Honey Teaspoon -- Oral - Honey Cup -- Oral - Nectar Teaspoon -- Oral - Nectar Cup -- Oral - Nectar Straw -- Oral - Thin Teaspoon -- Oral - Thin Cup -- Oral - Thin Straw -- Oral - Puree -- Oral - Mech Soft -- Oral - Regular -- Oral - Multi-Consistency -- Oral - Pill -- Oral Phase - Comment --  CHL IP PHARYNGEAL PHASE 09/20/2020 Pharyngeal Phase Impaired Pharyngeal- Pudding Teaspoon -- Pharyngeal -- Pharyngeal- Pudding Cup -- Pharyngeal  -- Pharyngeal- Honey Teaspoon -- Pharyngeal -- Pharyngeal- Honey Cup -- Pharyngeal -- Pharyngeal- Nectar Teaspoon -- Pharyngeal -- Pharyngeal- Nectar Cup -- Pharyngeal -- Pharyngeal- Nectar Straw -- Pharyngeal -- Pharyngeal- Thin Teaspoon NT Pharyngeal -- Pharyngeal- Thin Cup WFL Pharyngeal -- Pharyngeal- Thin Straw Reduced laryngeal elevation;Penetration/Aspiration during swallow Pharyngeal Material enters airway, remains ABOVE vocal cords and not ejected out Pharyngeal- Puree WFL Pharyngeal -- Pharyngeal- Mechanical Soft WFL Pharyngeal -- Pharyngeal- Regular -- Pharyngeal -- Pharyngeal- Multi-consistency -- Pharyngeal -- Pharyngeal- Pill WFL Pharyngeal -- Pharyngeal Comment --  CHL IP CERVICAL ESOPHAGEAL PHASE 09/20/2020 Cervical Esophageal Phase WFL Pudding Teaspoon -- Pudding Cup -- Honey Teaspoon -- Honey Cup -- Nectar Teaspoon -- Nectar Cup -- Nectar Straw -- Thin Teaspoon -- Thin Cup -- Thin Straw -- Puree -- Mechanical Soft -- Regular -- Multi-consistency -- Pill -- Cervical Esophageal Comment -- McCoy Leah Meryl 09/20/2020, 9:32 AM              ECHOCARDIOGRAM COMPLETE  Result Date: 09/21/2020    ECHOCARDIOGRAM REPORT   Patient Name:   DAMAREA MERKEL [facility] Date of Exam: 09/21/2020 Medical Rec #:  [id number]         Height:       65.0 in Accession #:    [id number]        Weight:       139.6 lb Date of Birth:  01-02-1956          BSA:          1.698 m Patient Age:    13 years          BP:           103/66 mmHg Patient Gender: M                 HR:           95 bpm. Exam Location:  Inpatient Procedure: 2D Echo, 3D Echo, Cardiac Doppler and Color Doppler Indications:    Bacteremia  History:        Patient has prior history of Echocardiogram  examinations, most                 recent 07/09/2019. Abnormal ECG, Arrythmias:Tachycardia,                 Signs/Symptoms:Bacteremia, Shortness of Breath and Dyspnea; Risk                 Factors:Sleep Apnea, Hypertension and Diabetes. Cancer. Right                 pneumothorax.  Subcutaneous air. Hypoxia.  Sonographer:    Roseanna Rainbow RDCS Referring Phys: [place] Comments: Technically difficult study due to poor echo windows, suboptimal parasternal window, suboptimal apical window and suboptimal subcostal window. Patient has trach collar. Off aix medial images in apical region. Feeding tube in subcostal region. IMPRESSIONS  1. Left ventricular ejection fraction, by estimation, is 60 to 65%. The left ventricle has normal function. The left ventricle has no regional wall motion abnormalities. There is mild left ventricular hypertrophy. Left ventricular diastolic parameters were normal.  2. Right ventricular systolic function is normal. The right ventricular size is moderately enlarged. There is normal pulmonary artery systolic pressure.  3. The mitral valve is normal in structure. No evidence of mitral valve regurgitation.  4. The aortic valve was not well visualized. Aortic valve regurgitation is not visualized. No aortic stenosis is present.  5. The inferior vena cava is normal in size with greater than 50% respiratory variability, suggesting right atrial pressure of 3 mmHg. Conclusion(s)/Recommendation(s): Techincally difficult study, but no vegetaion seen. If high clinical supicion for endocarditis, consider TEE. FINDINGS  Left Ventricle: Left ventricular ejection fraction, by estimation, is 60 to 65%. The left ventricle has normal function. The left ventricle has no regional wall motion abnormalities. The left ventricular internal cavity size was small. There is mild left ventricular hypertrophy. Left ventricular diastolic parameters were normal. Right Ventricle: The right ventricular size is moderately enlarged. Right vetricular wall thickness was not well visualized. Right ventricular systolic function is normal. There is normal pulmonary artery systolic pressure. The tricuspid regurgitant velocity is 2.78 m/s, and with an assumed right atrial pressure of 3 mmHg,  the estimated right ventricular systolic pressure is 81.1 mmHg. Left Atrium: Left atrial size was normal in size. Right Atrium: Right atrial size was normal in size. Pericardium: There is no evidence of pericardial effusion. Mitral Valve: The mitral valve is normal in structure. No evidence of mitral valve regurgitation. Tricuspid Valve: The tricuspid valve is normal in structure. Tricuspid valve regurgitation is trivial. Aortic Valve: The aortic valve was not well visualized. Aortic valve regurgitation is not visualized. No aortic stenosis is present. Pulmonic Valve: The pulmonic valve was not well visualized. Pulmonic valve regurgitation is not visualized. Aorta: The aortic root is normal in size and structure. Venous: The inferior vena cava is normal in size with greater than 50% respiratory variability, suggesting right atrial pressure of 3 mmHg. IAS/Shunts: The interatrial septum was not well visualized.  LEFT VENTRICLE PLAX 2D LVIDd:         3.60 cm      Diastology LVIDs:         2.30 cm      LV e' medial:    11.20 cm/s LV PW:         1.30 cm      LV E/e' medial:  8.5 LV IVS:        0.90 cm      LV e' lateral:  12.00 cm/s LVOT diam:     2.20 cm      LV E/e' lateral: 7.9 LV SV:         57 LV SV Index:   34 LVOT Area:     3.80 cm  LV Volumes (MOD) LV vol d, MOD A2C: 66.9 ml LV vol d, MOD A4C: 101.0 ml LV vol s, MOD A2C: 22.7 ml LV vol s, MOD A4C: 24.2 ml LV SV MOD A2C:     44.2 ml LV SV MOD A4C:     101.0 ml LV SV MOD BP:      78.9 ml RIGHT VENTRICLE             IVC RV S prime:     18.20 cm/s  IVC diam: 1.80 cm TAPSE (M-mode): 2.0 cm LEFT ATRIUM           Index       RIGHT ATRIUM           Index LA diam:      3.40 cm 2.00 cm/m  RA Area:     15.10 cm LA Vol (A2C): 13.3 ml 7.83 ml/m  RA Volume:   34.90 ml  20.56 ml/m LA Vol (A4C): 28.1 ml 16.55 ml/m  AORTIC VALVE LVOT Vmax:   126.00 cm/s LVOT Vmean:  71.400 cm/s LVOT VTI:    0.150 m  AORTA Ao Root diam: 3.40 cm Ao Asc diam:  3.70 cm MITRAL VALVE                TRICUSPID VALVE MV Area (PHT): 5.19 cm    TR Peak grad:   30.9 mmHg MV Decel Time: 146 msec    TR Vmax:        278.00 cm/s MV E velocity: 95.14 cm/s MV A velocity: 84.80 cm/s  SHUNTS MV E/A ratio:  1.12        Systemic VTI:  0.15 m                            Systemic Diam: 2.20 cm Oswaldo Milian MD Electronically signed by Oswaldo Milian MD Signature Date/Time: 09/21/2020/6:08:46 PM    Final    VAS [place] LOWER EXTREMITY VENOUS (DVT)  Result Date: 09/25/2020  Lower Venous DVT Study Patient Name:  MALCOME AMBROCIO [facility]  Date of Exam:   09/24/2020 Medical Rec #: [id number]          Accession #:    [id number] Date of Birth: Jun 13, 1955           Patient Gender: M Patient Age:   64Y Exam Location:  [facility] Procedure:      VAS [place] LOWER EXTREMITY VENOUS (DVT) Referring Phys: [id number] MURALI RAMASWAMY --------------------------------------------------------------------------------  Indications: Post-Covid severe pulmonary fibrosis, relapsed Hodkin's lymphoma, edema.  Comparison       07-09-2019 Bilateral lower extremity venous was negative for Study:           DVT. Performing Technologist: Darlin Coco RDMS,RVT  Examination Guidelines: A complete evaluation includes B-mode imaging, spectral Doppler, color Doppler, and power Doppler as needed of all accessible portions of each vessel. Bilateral testing is considered an integral part of a complete examination. Limited examinations for reoccurring indications may be performed as noted. The reflux portion of the exam is performed with the patient in reverse Trendelenburg.  +---------+---------------+---------+-----------+----------+--------------+ RIGHT    CompressibilityPhasicitySpontaneityPropertiesThrombus Aging +---------+---------------+---------+-----------+----------+--------------+ CFV      Full  Yes      Yes                                 +---------+---------------+---------+-----------+----------+--------------+ SFJ      Full                                                         +---------+---------------+---------+-----------+----------+--------------+ FV Prox  Full                                                        +---------+---------------+---------+-----------+----------+--------------+ FV Mid   Full                                                        +---------+---------------+---------+-----------+----------+--------------+ FV DistalFull                                                        +---------+---------------+---------+-----------+----------+--------------+ PFV      Full                                                        +---------+---------------+---------+-----------+----------+--------------+ POP      Full           Yes      Yes                                 +---------+---------------+---------+-----------+----------+--------------+ PTV      Full                                                        +---------+---------------+---------+-----------+----------+--------------+ PERO     Full                                                        +---------+---------------+---------+-----------+----------+--------------+   +---------+---------------+---------+-----------+----------+--------------+ LEFT     CompressibilityPhasicitySpontaneityPropertiesThrombus Aging +---------+---------------+---------+-----------+----------+--------------+ CFV      Full           Yes      Yes                                 +---------+---------------+---------+-----------+----------+--------------+ SFJ  Full                                                        +---------+---------------+---------+-----------+----------+--------------+ FV Prox  Full                                                        +---------+---------------+---------+-----------+----------+--------------+ FV Mid   Full                                                         +---------+---------------+---------+-----------+----------+--------------+ FV DistalFull                                                        +---------+---------------+---------+-----------+----------+--------------+ PFV      Full                                                        +---------+---------------+---------+-----------+----------+--------------+ POP      Full           Yes      Yes                                 +---------+---------------+---------+-----------+----------+--------------+ PTV      Full                                                        +---------+---------------+---------+-----------+----------+--------------+ PERO     Full                                                        +---------+---------------+---------+-----------+----------+--------------+     Summary: RIGHT: - There is no evidence of deep vein thrombosis in the lower extremity.  - No cystic structure found in the popliteal fossa.  - Ultrasound characteristics of enlarged lymph nodes are noted in the groin.  LEFT: - There is no evidence of deep vein thrombosis in the lower extremity.  - No cystic structure found in the popliteal fossa.  *See table(s) above for measurements and observations. Electronically signed by Harold Barban MD on 09/25/2020 at 10:25:35 PM.    Final    [place] EKG SITE RITE  Result Date: 10/06/2020 If Site Rite image not attached, placement could not be confirmed due to current cardiac rhythm.  US Abdomen Limited RUQ (LIVER/GB)  Result Date: 09/19/2020 CLINICAL DATA:  Transaminitis. EXAM: ULTRASOUND ABDOMEN LIMITED RIGHT UPPER QUADRANT COMPARISON:  None. FINDINGS: Gallbladder: No gallbladder wall thickening. Small amount of biliary sludge. No sonographic Murphy sign noted by sonographer. Common bile duct: Diameter: 3 mm, normal Liver: No focal lesion identified. Within normal limits in parenchymal echogenicity. Portal vein is patent on color  Doppler imaging with normal direction of blood flow towards the liver. Other: None. IMPRESSION: Biliary sludge without evidence of acute cholecystitis. Electronically Signed   By: Valentino Saxon MD   On: 09/19/2020 16:18    Lab Data:  CBC: Recent Labs  Lab 10/02/20 0704 10/03/20 0317 10/04/20 0247 10/06/20 0237  WBC 15.9* 12.5* 15.9* 13.1*  HGB 10.4* 10.0* 10.5* 11.2*  HCT 34.9* 34.3* 36.2* 39.1  MCV 97.8 99.1 99.5 100.0  PLT 224 254 248 759   Basic Metabolic Panel: Recent Labs  Lab 10/02/20 0704 10/03/20 0317 10/04/20 0247 10/04/20 1005 10/05/20 0251 10/06/20 0237 10/07/20 0235 10/08/20 0258  NA 143  143   < > 143 142 142 142  144 141 141  K 3.8  3.7   < > 4.2 4.8 4.0 4.7  4.8 4.6 4.4  CL 94*  93*   < > 92* 89* 92* 92*  91* 89* 89*  CO2 43*  43*   < > 45* >50* 46* 45*  45* 46* 46*  GLUCOSE 99  98   < > 81 142* 116* 86  85 93 99  BUN 26*  26*   < > 26* 25* 27* 27*  27* 25* 28*  CREATININE 0.43*  0.52*   < > 0.58* 0.63 0.57* 0.52*  0.59* 0.47* 0.49*  CALCIUM 9.3  9.3   < > 9.5 9.7 10.1 10.5*  10.5* 10.1 10.1  MG 1.9  --   --   --   --  2.4  --   --   PHOS 3.3  3.3   < > 3.9  --  5.3* 4.2  4.2 3.6 3.4   < > = values in this interval not displayed.   GFR: Estimated Creatinine Clearance: 81.1 mL/min (A) (by C-G formula based on SCr of 0.49 mg/dL (L)). Liver Function Tests: Recent Labs  Lab 10/04/20 0247 10/05/20 0251 10/06/20 0237 10/07/20 0235 10/08/20 0258  ALBUMIN 2.2* 2.4* 2.6* 2.7* 2.7*   No results for input(s): LIPASE, AMYLASE in the last 168 hours. No results for input(s): AMMONIA in the last 168 hours. Coagulation Profile: Recent Labs  Lab 10/04/20 1005  INR 1.0   Cardiac Enzymes: No results for input(s): CKTOTAL, CKMB, CKMBINDEX, TROPONINI in the last 168 hours. BNP (last 3 results) No results for input(s): PROBNP in the last 8760 hours. HbA1C: No results for input(s): HGBA1C in the last 72 hours. CBG: Recent Labs  Lab  10/07/20 2013 10/07/20 2324 10/08/20 0313 10/08/20 0726 10/08/20 1126  GLUCAP 125* 115* 87 77 134*   Lipid Profile: No results for input(s): CHOL, HDL, LDLCALC, TRIG, CHOLHDL, LDLDIRECT in the last 72 hours. Thyroid Function Tests: No results for input(s): TSH, T4TOTAL, FREET4, T3FREE, THYROIDAB in the last 72 hours. Anemia Panel: No results for input(s): VITAMINB12, FOLATE, FERRITIN, TIBC, IRON, RETICCTPCT in the last 72 hours. Urine analysis:    Component Value Date/Time   COLORURINE YELLOW 09/19/2020 1511   APPEARANCEUR HAZY (A) 09/19/2020 1511   LABSPEC 1.012 09/19/2020 1511   PHURINE 7.0 09/19/2020 1511   GLUCOSEU NEGATIVE 09/19/2020 1511   HGBUR  NEGATIVE 09/19/2020 1511   HGBUR negative 03/23/2007 0000   BILIRUBINUR NEGATIVE 09/19/2020 1511   BILIRUBINUR neg 11/11/2016 1017   KETONESUR NEGATIVE 09/19/2020 1511   PROTEINUR NEGATIVE 09/19/2020 1511   UROBILINOGEN 0.2 11/11/2016 1017   UROBILINOGEN 0.2 11/08/2012 0915   NITRITE NEGATIVE 09/19/2020 1511   LEUKOCYTESUR NEGATIVE 09/19/2020 1511     Rasul Decola M.D. [facility] 10/08/2020, 2:15 PM  Available via Epic secure chat 7am-7pm After 7 pm, please refer to night coverage provider listed on amion.

## 2020-10-08 NOTE — Progress Notes (Signed)
NAME:  Steve [name], MRN:  [id number], DOB:  01/27/1956, LOS: 47 ADMISSION DATE:  09/19/2020, CONSULTATION DATE:  09/24/20 REFERRING MD:  Dr Steve [name], CHIEF COMPLAINT:  Resp failure   HPI   65 year old male who is well kwn to PCCM service from springs-summer 2021 for s/p covid, respiratoy failure, ARDS, pneumothorax, prolonged mechanical ventilation (? 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  At baseline.   Port-A-Cath placement in December 2020 and gastrostomy tube placement in May 2021.  Of note , has Hodgkins Lymphoma  with recuirrence March 2022 (Bulky intrathoracic adenopathy esp new substernal and newosseous metastatic disease, PET 06/19/2020.). As of April 2022 got 2nd dose Adcetris.  The admitted May 2022 for "pneumonia" and Acetris help (in part due to high LFT) and all Rx held.  Also as a preadmission problem: Chronic caxhexia, protein calorie malnutrition - moderate, failure thrive, DM all as his baseline prior to admission problems/ his baseline ECOG May 2022 was 2  Admitted 09/19/20 (recent baseline 8LO2 via trach). With worsening cough, lethargy.  Blood culture came back positive with Staphylococcus epidermis on 09/22/20 (all 4 of 4 cutuirs), infectious disease and s/p Port-A-Cath removal 09/22/20. As a present on admission problem O 09/22/20 - was needed 10L O2. CT scan this admit 09/20/2020 shwoed worrsening lymphadenopathy in chest concerning for Hodgkins progression. On 6/15 - Seen by Dr Steve [name] and recommended ICE protocol when better (highly immune suppressive regimen). ID had recommended 2 week Rx  . On 6/16 - reports he is more hypoxemic needeing >90% fio2 via Trach collar though no increased work of breathing. CCM consulted  Remains full code despite multiple conversations  Pertinent  Medical History  Covid admission March - May 2021 Chronic vent and trach  dependent  Significant Hospital Events: Including procedures, antibiotic start and stop dates in addition to other pertinent events   6/16 - ccm consult  6/17 -CT angiogram ruled out pulmonary embolism.  He has progressive Hodgkin's disease on the CT scan of the chest.  He was unable to sustain facemask oxygen and has required being placed on pressure support ventilation through the tracheostomy.  He now has a cuffed 6 sized tracheostomy.  Currently on 50% oxygen and saturating well and more comfortable. 6/18 -stuck on pressure support ventilator at 40% FiO2.  Unable to wean off.  He is watching TV and is able to eat 6/22 slowly progressing PSV wean 6/24 tolerating ATC 6/25 tolerated ATC for about 1 and half hours 6/26 very short tolerance of ATC, back on vent quickly due to dyspnea 6/27 off vent all day and night  Interim History / Subjective:  Steve [name] has been off the vent for the past 2 days. Denies complaints. Worked with PT yesterday but had increased O2 requirements. Does not use PMV at baseline; requires 8L O2 at baseline.  Objective   Blood pressure (!) 114/53, pulse 85, temperature 98.3 F (36.8 C), temperature source Oral, resp. rate (!) 34, height 5\' 5"  (1.651 m), weight 69 kg, SpO2 97 %.    FiO2 (%):  [60 %-80 %] 60 %   Intake/Output Summary (Last 24 hours) at 10/08/2020 0913 Last data filed at 10/08/2020 0600 Gross per 24 hour  Intake 2632.67 ml  Output 1690 ml  Net 942.67 ml    Filed Weights   09/19/20 1930 09/26/20 0258 09/30/20 1646  Weight: 63.3 kg 63.3 kg 69  kg    Examination: Gen: chronically ill appearing man lying in bed in NAD HEENT: [place]/AT, eyes anicteric Neck: trach in place, cuff inflated Lungs: faint rhales, no accessory muscle use, mild tachypnea. No rhonchi. CV: S1S2, RRR Abd: soft, NT, ND Ext: no cyanosis, clubbing, or LE edema Neuro: sleeping but arouses easily  Labs/imaging that I have personally reviewed  (right click and "Reselect  all SmartList Selections" daily)  Bicarb 45 BUN 27 Cr 0.52 WBC 13.1   LABS    PULMONARY No results for input(s): PHART, PCO2ART, PO2ART, HCO3, TCO2, O2SAT in the last 168 hours.  Invalid input(s): PCO2, PO2   CBC Recent Labs  Lab 10/03/20 0317 10/04/20 0247 10/06/20 0237  HGB 10.0* 10.5* 11.2*  HCT 34.3* 36.2* 39.1  WBC 12.5* 15.9* 13.1*  PLT 254 248 223     CHEMISTRY Recent Labs  Lab 10/02/20 0704 10/03/20 0317 10/04/20 0247 10/04/20 1005 10/05/20 0251 10/06/20 0237 10/07/20 0235 10/08/20 0258  NA 143  143   < > 143 142 142 142  144 141 141  K 3.8  3.7   < > 4.2 4.8 4.0 4.7  4.8 4.6 4.4  CL 94*  93*   < > 92* 89* 92* 92*  91* 89* 89*  CO2 43*  43*   < > 45* >50* 46* 45*  45* 46* 46*  GLUCOSE 99  98   < > 81 142* 116* 86  85 93 99  BUN 26*  26*   < > 26* 25* 27* 27*  27* 25* 28*  CREATININE 0.43*  0.52*   < > 0.58* 0.63 0.57* 0.52*  0.59* 0.47* 0.49*  CALCIUM 9.3  9.3   < > 9.5 9.7 10.1 10.5*  10.5* 10.1 10.1  MG 1.9  --   --   --   --  2.4  --   --   PHOS 3.3  3.3   < > 3.9  --  5.3* 4.2  4.2 3.6 3.4   < > = values in this interval not displayed.    Estimated Creatinine Clearance: 81.1 mL/min (A) (by C-G formula based on SCr of 0.49 mg/dL (L)).  Resolved Hospital Problem list    Assessment & Plan:   Acute on chronic respiratory failure- baseline 8L TC. 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 Severe baseline ILD to have CO2 retention. Significant tracheobronchomalacia on CT  -Con't TC trials. Hopeful that he can be transferred out of the ICU in the coming days.  -BNP WNL; no role for diuretics   Chronic metabolic alkalosis, compensatory for chronic respiratory  acidosis Bicarb more stable today after diamox -monitor, no additional diamox  Deconditioning -PT, OT  -planning to go home with [facility]  Sepsis due to MRSE bacteremia -resolved -Appreciate infectious disease input -Remains on vancomycin to complete course -Off steroids -per cardiology, no TEE. Plan for 4 weeks antibiotics  Relapsed Hodgkin's lymphoma Bulky mediastinal adenopathy on CT on 6/17 -not currently a candidate for treatment, but he remains hopeful  PCCM will continue to follow intermittently. Please call with questions in the interim.  Best practice (right click and "Reselect all SmartList Selections" daily)  Per primary   Julian Hy, DO 10/08/20 10:45 AM [facility]

## 2020-10-09 ENCOUNTER — Encounter: Payer: Self-pay | Admitting: *Deleted

## 2020-10-09 DIAGNOSIS — T451X5A Adverse effect of antineoplastic and immunosuppressive drugs, initial encounter: Secondary | ICD-10-CM | POA: Diagnosis not present

## 2020-10-09 DIAGNOSIS — R627 Adult failure to thrive: Secondary | ICD-10-CM | POA: Diagnosis not present

## 2020-10-09 DIAGNOSIS — J9611 Chronic respiratory failure with hypoxia: Secondary | ICD-10-CM | POA: Diagnosis not present

## 2020-10-09 DIAGNOSIS — G620 Drug-induced polyneuropathy: Secondary | ICD-10-CM | POA: Diagnosis not present

## 2020-10-09 DIAGNOSIS — R7881 Bacteremia: Secondary | ICD-10-CM | POA: Diagnosis not present

## 2020-10-09 LAB — RENAL FUNCTION PANEL
Albumin: 2.8 g/dL — ABNORMAL LOW (ref 3.5–5.0)
BUN: 23 mg/dL (ref 8–23)
CO2: >50 mmol/L — ABNORMAL HIGH (ref 22–32)
Calcium: 10.2 mg/dL (ref 8.9–10.3)
Chloride: 88 mmol/L — ABNORMAL LOW (ref 98–111)
Creatinine, Ser: 0.43 mg/dL — ABNORMAL LOW (ref 0.61–1.24)
GFR, Estimated: >60 mL/min (ref >60)
Glucose, Bld: 134 mg/dL — ABNORMAL HIGH (ref 70–99)
Phosphorus: 3.7 mg/dL (ref 2.5–4.6)
Potassium: 4.3 mmol/L (ref 3.5–5.1)
Sodium: 140 mmol/L (ref 135–145)

## 2020-10-09 LAB — GLUCOSE, CAPILLARY
Glucose-Capillary: 112 mg/dL — ABNORMAL HIGH (ref 70–99)
Glucose-Capillary: 122 mg/dL — ABNORMAL HIGH (ref 70–99)
Glucose-Capillary: 147 mg/dL — ABNORMAL HIGH (ref 70–99)
Glucose-Capillary: 181 mg/dL — ABNORMAL HIGH (ref 70–99)
Glucose-Capillary: 126 mg/dL — ABNORMAL HIGH (ref 70–99)
Glucose-Capillary: 111 mg/dL — ABNORMAL HIGH (ref 70–99)

## 2020-10-09 LAB — VANCOMYCIN, TROUGH: Vancomycin Tr: 14 ug/mL — ABNORMAL LOW (ref 15–20)

## 2020-10-09 MED ORDER — ACETAZOLAMIDE 250 MG PO TABS
500.0000 mg | ORAL_TABLET | Freq: Once | ORAL | Status: AC
  Administered 2020-10-09: 500 mg via ORAL
  Filled 2020-10-09: qty 2

## 2020-10-09 NOTE — Progress Notes (Signed)
Pharmacy Antibiotic Note  Steve [name] is a 65 y.o. male admitted on 09/19/2020 with possible pneumonia, found to have staph epidermidis bacteremia. Pharmacy has been consulted for Vancomycin dosing. 6/21 Last calculated AUC = 456 (goal 400-550)  Today, 10/09/2020:   SCr remains low/stable at 0.43, WBC remains elevated but down @ 13.1 on 6/28  (steroids d/c on 6/24).  TEE delayed. AF ID extended treatment: 6 weeks from first negative blood cultures on 09/22/20 since unable to have TEE 7/1 0500 AM vancomycin trough = 14 mcg/ml, acceptable  Plan: Continue Vancomycin 750mg  q12h. Stop date 11/03/20. OPAT orders pended under discharge orders   Height: 5\' 5"  (165.1 cm) Weight: 69 kg (152 lb 1.9 oz) IBW/kg (Calculated) : 61.5  Temp (24hrs), Avg:97.8 F (36.6 C), Min:97.5 F (36.4 C), Max:98.1 F (36.7 C)  Recent Labs  Lab 10/03/20 0317 10/04/20 0247 10/04/20 1005 10/05/20 0251 10/06/20 0237 10/07/20 0235 10/08/20 0258 10/09/20 0500  WBC 12.5* 15.9*  --   --  13.1*  --   --   --   CREATININE 0.52*  0.52* 0.58*   < > 0.57* 0.52*  0.59* 0.47* 0.49* 0.43*  VANCOTROUGH  --   --   --   --   --   --   --  14*   < > = values in this interval not displayed.     Estimated Creatinine Clearance: 81.1 mL/min (A) (by C-G formula based on SCr of 0.43 mg/dL (L)).    No Known Allergies Antimicrobials this admission: 6/11 Vancomycin >> 6/12, resumed 6/12 >  (7/26) 6/11 Cefepime >> 6/15 Dose adjustments this admission: 6/14 0536 VP: 37, 1643 VT: 10 - AUC 527, continue Vanc 750mg  q12h  6/20 VP 1800 = 24; 6/21 VT 0330 = 12; AUC 456 7/1 0500 VT = 14, keep 750 q12 Microbiology results: 6/11 BCx: 4/4 bottles Staph epi (methicillin resistance detected)  6/11 UCx: multiple species 6/11 Sputum:  needs collecting 6/11 Strep pneumo urinary antigen: neg 6/11 Legionella urinary antigen: neg 6/12 MRSA PCR: neg 6/14 BCx: NGF  Thank you for allowing pharmacy to be a part of this patient's  care.  Eudelia Bunch, Pharm.D 10/09/2020 11:57 AM

## 2020-10-09 NOTE — Progress Notes (Signed)
Patient continues to be hospitalized. Continue to follow for post discharge follow up and treatment needs.   Oncology Nurse Navigator Documentation  Oncology Nurse Navigator Flowsheets 10/09/2020  Abnormal Finding Date -  Confirmed Diagnosis Date -  Diagnosis Status -  Planned Course of Treatment -  Phase of Treatment -  Chemotherapy Pending- Reason: -  Chemotherapy Actual Start Date: -  Navigator Follow Up Date: 10/16/2020  Navigator Follow Up Reason: Appointment Review  Navigator Location [facility]  Referral Date to RadOnc/MedOnc -  Navigator Encounter Type Appt/Treatment Plan Review  Telephone -  Treatment Initiated Date -  Patient Visit Type MedOnc  Treatment Phase Active Tx  Barriers/Navigation Needs Coordination of Care;Education;Employed;Family Concerns;Morbidities/Frailty;Multiple Hospital Admissions  Education -  Interventions None Required  Acuity Level 4-High Needs (Greater Than 4 Barriers Identified)  Referrals -  Coordination of Care -  Education Method -  Support Groups/Services Friends and Family  Time Spent with Patient 15

## 2020-10-09 NOTE — Progress Notes (Signed)
Occupational Therapy Treatment Patient Details Name: Steve [name] MRN: [id number] DOB: [birth year] Today's Date: 10/09/2020    History of present illness Patient  is a 65 year old male who was admitted for possible pneumonia and found to have a staph epidermidis bacteremia. 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  0n 09/24/20, developed respiratory distress with increased oxygen requirement and was transferred to ICU and started on pressure support ventilation. Currently on trach collar on 10L fiO2 at 60% with intermittent vent support.   OT comments  Treatment focused on promoting activity out of bed and to work on improving activity tolerance and strength needed for mobility and ADLs. Worked on sitting edge of bed without upper extremity support and leaning backwards and forwards (needing input through knees to maintain feet on floor) to improve core strength. Patient able to stand and march in place x 27 seconds. Patient fatigued quickly with both tasks and exhibited dyspnea and increase work of breathing but o2 sats > 92% at all times on 11 L trach collar and 60% fio2. Patient making slow gains toward goals but pleasant and agreeable to therapy. Cont POC.   Follow Up Recommendations  Home health OT    Equipment Recommendations  3 in 1 bedside commode    Recommendations for Other Services      Precautions / Restrictions Precautions Precautions: Fall Precaution Comments: trach collar at 11 L: 60% fio2, intermittent vent use, PEG tube, Restrictions Weight Bearing Restrictions: No       Mobility Bed Mobility Overal bed mobility: Needs Assistance Bed Mobility: Supine to Sit     Supine to sit:  Supervision;HOB elevated     General bed mobility comments: supervision to transfer to edge of bed.    Transfers Overall transfer level: Needs assistance Equipment used: Rolling walker (2 wheeled) Transfers: Sit to/from [facility] Sit to Stand: Min assist Stand pivot transfers: Min guard       General transfer comment: Min assist to stand from bed height with RW - increased time to power up from ICU bed level height. Able to stand march in place with 27 seconds. After sitting break to recover patient min guard to transfer to recliner. O2 sats maintained above 92% on 11 L trach collar and 60% fio2. Increased work of breathing by patient however despite WFL o2 saturations.    Balance   Sitting-balance support: No upper extremity supported;Feet supported Sitting balance-Leahy Scale: Fair Sitting balance - Comments: cues to not use arms to prop                                   ADL either performed or assessed with clinical judgement   ADL Overall ADL's : Needs assistance/impaired Eating/Feeding: Set up;Sitting Eating/Feeding Details (indicate cue type and reason): patinet assisted with breakfast set up seated in chair. Grooming: Set up;Wash/dry face Grooming Details (indicate cue type and reason): washed face in supine with warm cloth to assist with improving alertness for therapy. Upper Body Bathing: Sitting;Moderate assistance Upper Body Bathing Details (indicate cue type and reason): Patient seated at side of bed. Therapist washed patient's back, dried and applied lotion while cueing patient to not use upper  extremity to support trunk in order towork on core strengthening.                                 Vision Patient Visual Report: No change from baseline     Perception     Praxis      Cognition Arousal/Alertness: Awake/alert Behavior During Therapy: WFL for tasks assessed/performed Overall Cognitive Status: Within  Functional Limits for tasks assessed                                          Exercises Other Exercises Other Exercises: Marching in place x 27 seconds, hip hikes x 3 ( limited by dyspnea), and leaning forward and back with hands across chest to work on core strengthening> limited by dyspnea despite normal o2 sats.   Shoulder Instructions       General Comments      Pertinent Vitals/ Pain       Pain Assessment: No/denies pain  Home Living                                          Prior Functioning/Environment              Frequency  Min 2X/week        Progress Toward Goals  OT Goals(current goals can now be found in the care plan section)  Progress towards OT goals: Progressing toward goals  Acute Rehab OT Goals Patient Stated Goal: walk again OT Goal Formulation: With patient Time For Goal Achievement: 10/15/20 Potential to Achieve Goals: [name] Discharge plan remains appropriate    Co-evaluation                 AM-PAC OT "6 Clicks" Daily Activity     Outcome Measure   Help from another person eating meals?: A Little Help from another person taking care of personal grooming?: A Little Help from another person toileting, which includes using toliet, bedpan, or urinal?: Total Help from another person bathing (including washing, rinsing, drying)?: A Lot Help from another person to put on and taking off regular upper body clothing?: A Little Help from another person to put on and taking off regular lower body clothing?: A Lot 6 Click Score: 14    End of Session Equipment Utilized During Treatment: Oxygen;Rolling walker  OT Visit Diagnosis: Other abnormalities of gait and mobility (R26.89);Muscle weakness (generalized) (M62.81)   Activity Tolerance Patient limited by fatigue   Patient Left in chair;with call bell/phone within reach;with nursing/sitter in room   Nurse Communication Mobility status         Time: 0981-1914 OT Time Calculation (min): 21 min  Charges: OT General Charges $OT Visit: 1 Visit OT Treatments $Therapeutic Activity: 8-22 mins  Steve [name], OTR/L [place]  Office [phone] Pager: [place] 10/09/2020, 9:17 AM

## 2020-10-09 NOTE — Progress Notes (Signed)
PHARMACY CONSULT NOTE FOR:  OUTPATIENT  PARENTERAL ANTIBIOTIC THERAPY (OPAT)  Indication:  MRSE bacteremia Regimen: Vancomycin 750 mg IV q12 End date: 11/03/20  IV antibiotic discharge orders are pended. To discharging provider:  please sign these orders via discharge navigator,  Select New Orders & click on the button choice - Manage This Unsigned Work.     Thank you for allowing pharmacy to be a part of this patient's care.  Eudelia Bunch, Pharm.D 10/09/2020 2:01 PM

## 2020-10-09 NOTE — Progress Notes (Signed)
[facility]                                                                              Patient Demographics  Steve [name], is a 65 y.o. male, DOB - 03-May-1955, LZJ:[id number]  Admit date - 09/19/2020   Admitting Physician Bonnell Public, MD  Outpatient Primary MD for the patient is Saguier, [name]  Outpatient specialists:   LOS - 20  days   Medical records reviewed and are as summarized below:    Chief Complaint  Patient presents with   Hyperkalemia       Brief summary   Briefly, 65 years old male with PMH significant for [id number] infection complicated by ARDS, pulmonary fibrosis / pneumonitis and chronic respiratory failure on 8 L via trach, PEG dependence, Hodgkin's lymphoma on chemo, type 2 diabetes, hypertension and GERD presented in the ED with generalized malaise, poor appetite and failure to thrive and was admitted for possible pneumonia and found to have a staph epidermidis bacteremia.  First negative blood cultures 09/22/20.  0n 09/24/20, patient developed respiratory distress with increased oxygen requirement and was transferred to ICU and started on pressure support ventilation.  Critical care and ID consulted.   ID recommended TEE to rule out infective endocarditis, however not stable to have TEE at this time per cardiology.    ID recommended 6 weeks of IV antibiotics from first negative blood cultures on 09/22/20, if unable to have TEE.  Cardiology was reconsulted to reassess if patient is stable for TEE on 6/28 however still having descending episodes and unstable for TEE..     Assessment & Plan    Principal Problem: Sepsis due to pneumonia, staph epidermidis bacteremia: Present on admission -Met sepsis criteria on presentation with tachycardia, tachypnea, leukocytosis, chest x-ray with possible recurrent pneumonia -CT chest with interval increase in bulk of mediastinal and axillary adenopathy. -Blood cultures grew staph  epidermidis, Port-A-Cath was removed on 6/16. -Patient remained on cefepime from 6/11-6/15.  ID recommended TEE to exclude endocarditis, however patient has been unstable for TEE -Continue midodrine for hypotension, stress dose steroids tapered off on 6/24 -ID recommended vancomycin for minimum 6 weeks from date of negative culture on 6/14, will need 4 more weeks upon discharge (if TEE not done). -Sepsis physiology has resolved, Currently getting closer to his baseline. Will place OPAT consult.  Will need home health for IV antibiotics.  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 x1, reassess bicarb in a.m.  Relapsed Hodgkin lymphoma -CT chest concerning for worsening mediastinal and axillary lymphadenopathy -Oncology and palliative medicine following -Seen by Dr. Marin Olp on 6/20, PICC line has been placed  Diabetes mellitus type 2,  NIDDM with hyperglycemia, uncontrolled -CBGs currently stable, continue current insulin regimen  -Levemir 10 units at bedtime with sliding scale insulin, sensitive    Normocytic anemia, anemia of chronic disease -Will follow H&H  Hyponatremia, hypokalemia -Currently resolved   Pressure injury -Sacrum mid stage I -Wound care per RN  Severe malnutrition secondary to chronic illness, malignancy -Continue tube feeds -  Estimated body mass index is 25.31 kg/m as calculated from the following:   Height as of this encounter: 5' 5"  (1.651 m).   Weight as of this  encounter: 69 kg.  Code Status: full  DVT Prophylaxis:  enoxaparin (LOVENOX) injection 40 mg Start: 09/19/20 2200   Level of Care: Level of care: ICU Family Communication: Discussed all imaging results, lab results, explained patient's wife on phone [phone] today    Disposition Plan:     Status is: Inpatient  Remains inpatient appropriate because:Inpatient level of care appropriate due to severity of illness  Dispo: The patient is from: Home              Anticipated d/c is to: Home              Patient currently is not medically stable to d/c.  Once respiratory status is close to his baseline   Difficult to place patient No      Time Spent in minutes   25 minutes  Procedures:  Mech vent   Consultants:   Pulmonary critical care Oncology ID Palliative medicine    Antimicrobials:   Anti-infectives (From admission, onward)    Start     Dose/Rate Route Frequency Ordered Stop   09/23/20 0800  ceFEPIme (MAXIPIME) 2 g in sodium chloride 0.9 % 100 mL IVPB  Status:  Discontinued        2 g 200 mL/hr over 30 Minutes Intravenous Every 8 hours 09/23/20 0738 09/23/20 1758   09/21/20 0400  vancomycin (VANCOREADY) IVPB 750 mg/150 mL        750 mg 150 mL/hr over 60 Minutes Intravenous Every 12 hours 09/20/20 1715 11/04/20 0559   09/20/20 1730  vancomycin (VANCOREADY) IVPB 750 mg/150 mL        750 mg 150 mL/hr over 60 Minutes Intravenous STAT 09/20/20 1714 09/20/20 1954   09/20/20 0400  vancomycin (VANCOREADY) IVPB 750 mg/150 mL  Status:  Discontinued        750 mg 150 mL/hr over 60 Minutes Intravenous Every 12 hours 09/19/20 1932 09/20/20 1155   09/20/20 0000  ceFEPIme (MAXIPIME) 2 g in sodium chloride 0.9 % 100 mL IVPB  Status:  Discontinued        2 g 200 mL/hr over 30 Minutes Intravenous Every 8 hours 09/19/20 1932 09/23/20 0721   09/19/20 1600  ceFEPIme (MAXIPIME) 2 g in sodium chloride 0.9 % 100 mL IVPB        2 g 200 mL/hr over 30 Minutes Intravenous  Once 09/19/20  1548 09/19/20 1634   09/19/20 1600  vancomycin (VANCOREADY) IVPB 1250 mg/250 mL        1,250 mg 166.7 mL/hr over 90 Minutes Intravenous STAT 09/19/20 1557 09/19/20 1822          Medications  Scheduled Meds:  chlorhexidine gluconate (MEDLINE KIT)  15 mL Mouth Rinse BID   Chlorhexidine Gluconate Cloth  6 each Topical Q0600   cholecalciferol  10,000 Units Oral Daily   enoxaparin (LOVENOX) injection  40 mg Subcutaneous Q24H   free water  100 mL Per Tube Q6H   insulin  aspart  0-9 Units Subcutaneous Q4H   insulin glargine  10 Units Subcutaneous QHS   mouth rinse  15 mL Mouth Rinse BID   midodrine  5 mg Per Tube TID WC   multivitamin  15 mL Per Tube Daily   pantoprazole sodium  40 mg Per Tube Daily   sertraline  50 mg Per Tube Daily   sodium chloride flush  10-40 mL Intracatheter Q12H   traZODone  100 mg Per Tube QHS   Continuous Infusions:  sodium chloride 20 mL/hr at 10/04/20 1343   feeding supplement (VITAL AF 1.2 CAL) 1,000 mL (10/08/20 1648)   vancomycin Stopped (10/09/20 0655)   PRN Meds:.albuterol, guaiFENesin, hyoscyamine, LORazepam, sodium chloride flush      Subjective:   Steve [name] was seen and examined today.  Somewhat sleepy today but easily arousable.  No acute issues overnight.  On 10 L O2 via [place].  No fevers, tolerating tube feeds  Objective:   Vitals:   10/09/20 0600 10/09/20 0800 10/09/20 1136 10/09/20 1200  BP: (!) 106/59   (!) 106/57  Pulse: 81  82 71  Resp:   (!) 22   Temp:  98 F (36.7 C)  98.8 F (37.1 C)  TempSrc:  Axillary  Oral  SpO2: 99%  99% 97%  Weight:      Height:        Intake/Output Summary (Last 24 hours) at 10/09/2020 1321 Last data filed at 10/09/2020 1000 Gross per 24 hour  Intake 540.06 ml  Output 1550 ml  Net -1009.94 ml     Wt Readings from Last 3 Encounters:  09/30/20 69 kg  09/18/20 64.4 kg  09/08/20 64.4 kg   Physical Exam General: Sleepy but easily arousable, NAD Cardiovascular: S1 S2 clear, RRR. No pedal  edema b/l Respiratory: Bilateral scattered rhonchi Gastrointestinal: Soft, nontender, nondistended, NBS, PEG tube Ext: no pedal edema bilaterally Neuro: no new deficits Psych: somewhat sleepy today   Data Reviewed:  I have personally reviewed following labs and imaging studies  Micro Results No results found for this or any previous visit (from the past 240 hour(s)).  Radiology Reports DG Chest 2 View  Result Date: 09/18/2020 CLINICAL DATA:  Acute on chronic respiratory failure. EXAM: CHEST - 2 VIEW COMPARISON:  Aug 15, 2020. FINDINGS: Stable cardiomegaly. Tracheostomy tube is in good position. Left-sided Port-A-Cath is unchanged. Stable bilateral lung opacities are noted concerning for multifocal pneumonia or chronic scarring. No pneumothorax or pleural effusion is noted. Bony thorax is unremarkable. IMPRESSION: Stable bilateral lung opacities as described above. Electronically Signed   By: Marijo Conception M.D.   On: 09/18/2020 12:59   CT CHEST WO CONTRAST  Result Date: 09/20/2020 CLINICAL DATA:  Respiratory failure.  History of Hodgkin's lymphoma. EXAM: CT CHEST WITHOUT CONTRAST TECHNIQUE: Multidetector CT imaging of the chest was performed following the standard protocol without IV contrast. COMPARISON:  July 21, 2020 FINDINGS: Cardiovascular: Heart is mildly enlarged. LEFT chest port tip terminates at the superior cavoatrial junction. No pericardial effusion. Minimal atherosclerotic calcifications. Enlarged pulmonary artery as can be seen in pulmonary arterial hypertension. Mediastinum/Nodes: Revisualization of extensive adenopathy. LEFT level 3 axillary lymph node measures 11 mm, previously 3 mm (series 2, image 42). LEFT supraclavicular lymph node measures 15 mm, previously 15 mm (series 2, image 24). RIGHT level 1 axillary lymph node measures 8 mm, previously 4 mm (series 2, image 63). RIGHT internal mammary lymph node measures 31 mm, previously 26 mm (series 2, image 77). RIGHT  paratracheal conglomeration spanned 59 x 43 mm, previously 44 by 30 mm (series 2, image 45). There is a lunate configuration of the trachea. Lungs/Pleura: Visualization of a peripheral rind of fluid adjacent to the RIGHT lung, similar in comparison to prior. Extensive bilateral ground-glass opacities with subpleural reticulation and suggestion of honeycombing. Peribronchovascular nodularity is similar in comparison to prior. Similar appearance of RIGHT middle lobe atelectasis. Upper Abdomen: No acute abnormality. Musculoskeletal: Known osseous metastatic disease is not well visualized. Limited evaluation of the ribs secondary to respiratory motion. No acute thoracic spine fracture. IMPRESSION: 1. Interval increase in bulky adenopathy within the mediastinum consistent with known lymphoma. Increased axillary lymph node size in comparison to prior. 2. Revisualization of sequela of advanced pulmonary fibrosis. Unchanged appearance of peribronchovascular nodularity. This could reflect lymphangitic spread of lymphoma versus underlying infection. 3. Unchanged small rind of RIGHT-sided pleural fluid. Underlying pleural nodularity from lymphoma remains in the differential. 4. 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 5. Enlarged pulmonary artery as can be seen in pulmonary arterial hypertension. Electronically Signed   By: Valentino Saxon MD   On: 09/20/2020 17:25   CT Angio Chest Pulmonary Embolism (PE) W or WO Contrast  Result Date: 09/25/2020 CLINICAL DATA:  Cough and hypoxia.  COVID-19. EXAM: CT ANGIOGRAPHY CHEST WITH CONTRAST TECHNIQUE: Multidetector CT imaging of the chest was performed using the standard protocol during bolus administration of intravenous contrast. Multiplanar CT image reconstructions and MIPs were obtained to evaluate the vascular anatomy. CONTRAST:  23m OMNIPAQUE IOHEXOL 350 MG/ML SOLN COMPARISON:  09/20/2020 FINDINGS: Cardiovascular: Contrast  injection is sufficient to demonstrate satisfactory opacification of the pulmonary arteries to the segmental level. There is no pulmonary embolus or evidence of right heart strain. The size of the main pulmonary artery is normal. Heart size is normal, with no pericardial effusion. The course and caliber of the aorta are normal. There is no atherosclerotic calcification. No acute aortic syndrome. Mediastinum/Nodes: Bulky mediastinal adenopathy is unchanged since the recent study of 09/20/2020. Lungs/Pleura: Tracheostomy tube tip is at the level of the clavicular heads. There is increased pleural fluid on the right. Diffuse pleural thickening is unchanged. Unchanged appearance of diffuse parenchymal interstitial abnormality. Upper Abdomen: Contrast bolus timing is not optimized for evaluation of the abdominal organs. The visualized portions of the organs of the upper abdomen are normal. Musculoskeletal: No chest wall abnormality. No bony spinal canal stenosis. Review of the MIP images confirms the above findings. IMPRESSION: 1. No pulmonary embolus or acute aortic syndrome. 2. Bulky mediastinal adenopathy and diffuse pleural, interstitial and parenchymal pulmonary abnormalities are unchanged since 09/20/2020. Electronically Signed   By: KUlyses JarredM.D.   On: 09/25/2020 02:54   IR REMOVAL TUN ACCESS W/ PORT W/O FL MOD SED  Result Date: 09/22/2020 INDICATION: 65year old male with a history of Hodgkin lymphoma currently presenting with bacteremia associated with pneumonia. He has a left chest port catheter which was placed by interventional radiology (Dr. SAnnamaria Boots in December of 2020. he presents for port catheter removal to help facilitate clearance of bacteremia. EXAM: REMOVAL RIGHT IJ VEIN PORT-A-CATH MEDICATIONS: None ANESTHESIA/SEDATION: Moderate (conscious) sedation was employed during this procedure. A total of Versed 1 mg and Fentanyl 50 mcg was administered intravenously. Moderate Sedation Time: 12  minutes. The patient's level of consciousness and vital signs were monitored continuously by radiology nursing throughout the procedure under my direct supervision. FLUOROSCOPY TIME:  None. COMPLICATIONS: None immediate. PROCEDURE: Informed written consent was obtained from the patient after a  thorough discussion of the procedural risks, benefits and alternatives. All questions were addressed. Maximal Sterile Barrier Technique was utilized including caps, mask, sterile gowns, sterile gloves, sterile drape, hand hygiene and skin antiseptic. A timeout was performed prior to the initiation of the procedure. The right chest was prepped and draped in a sterile fashion. Lidocaine was utilized for local anesthesia. An incision was made over the previously healed surgical incision. Utilizing blunt dissection, the port catheter and reservoir were removed from the underlying subcutaneous tissue in their entirety. Securing sutures were also removed. The pocket was irrigated with a copious amount of sterile normal saline. The wound was examined. No evidence of purulence. The pocket was closed with interrupted 3-0 Vicryl stitches. The subcutaneous tissue was closed with 3-0 Vicryl interrupted subcutaneous stitches. Dermabond was applied. IMPRESSION: Successful right IJ vein Port-A-Cath explant. Electronically Signed   By: Jacqulynn Cadet M.D.   On: 09/22/2020 16:18   DG CHEST PORT 1 VIEW  Result Date: 09/24/2020 CLINICAL DATA:  Respiratory distress EXAM: PORTABLE CHEST 1 VIEW COMPARISON:  09/19/2020 FINDINGS: Lung volumes are small with elevation of the right hemidiaphragm, stable since prior examination. Superimposed multifocal pulmonary infiltrates likely related to chronic underlying interstitial lung disease appears stable, better assessed on CT examination of 09/20/2020. Superimposed right pleural thickening is again noted. Tracheostomy unchanged. Cardiac size is enlarged with associated superior mediastinal widening  again noted related to extensive mediastinal and pericardial adenopathy better demonstrated on prior CT examination. No acute bone abnormality. IMPRESSION: Stable diffuse pulmonary infiltrates likely related to chronic underlying interstitial lung disease and asymmetric right pleural thickening. Stable enlargement of the cardiomediastinal silhouette related to extensive mediastinal and pericardial adenopathy better appreciated on prior CT examination. Electronically Signed   By: Fidela Salisbury MD   On: 09/24/2020 11:41   DG Chest Port 1 View  Result Date: 09/19/2020 CLINICAL DATA:  Shortness of breath. EXAM: PORTABLE CHEST 1 VIEW COMPARISON:  Chest x-ray dated September 18, 2020. FINDINGS: Unchanged tracheostomy tube and left chest wall port catheter. Stable cardiomegaly and mediastinal widening related to underlying lymphadenopathy. Unchanged diffuse patchy nodular opacities throughout both lungs superimposed on chronic pulmonary fibrosis. Unchanged small right pleural effusion. No pneumothorax. No acute osseous abnormality. IMPRESSION: 1. Unchanged multifocal pneumonia superimposed on chronic severe pulmonary fibrosis. 2. Unchanged mediastinal lymphadenopathy. Electronically Signed   By: Titus Dubin M.D.   On: 09/19/2020 15:39   DG Swallowing Func-Speech Pathology  Result Date: 09/20/2020 Formatting of this result is different from the original. Objective Swallowing Evaluation: Type of Study: MBS-Modified Barium Swallow Study  Patient Details Name: THI SISEMORE MRN: [id number] Date of Birth: [birth year] Today's Date: 09/20/2020 Time: SLP Start Time (ACUTE ONLY): 0903 -SLP Stop Time (ACUTE ONLY): 1540 SLP Time Calculation (min) (ACUTE ONLY): 20 min Past Medical History: Past Medical History: Diagnosis Date  Abscess of muscle 08/10/2011  staph infection of right hip   Acute on chronic respiratory failure with hypoxia ([name])   Acute respiratory distress syndrome (ARDS) due to COVID-19 virus ([place])   Anxiety    Diabetes mellitus without complication ([name])   GERD (gastroesophageal reflux disease)   rare reflux - no meds for reflux - NO PROBLEM IN PAST SEVERAL YRS  HCAP (healthcare-associated pneumonia)   Hip dysplasia, congenital   no surgery as a child for hip dysplasia - has had bilateral hip replacements as an adult  Hodgkin lymphoma ([place])   Hypertension   Pancreatitis   Pneumothorax, acute   Postoperative anemia due to acute blood loss  09/07/2012  Septic arthritis of hip ([place]) 09/05/2012  PT'S TOTAL HIP JOINT REMOVED - ANTIBIOTIC SPACE PLACED AND PT HAS FINISHED IV ANTIBIOTICS ( PICC LINE REMOVED)  Sleep apnea   USES CPAP Past Surgical History: Past Surgical History: Procedure Laterality Date  COLONOSCOPY  04/26/2007  HERNIA REPAIR    inguinal hernia x3  IR GASTROSTOMY TUBE MOD SED  08/22/2019  IR IMAGING GUIDED PORT INSERTION  03/29/2019  JOINT REPLACEMENT  2002 & 2007  bilateral hip replacement  LYMPH NODE BIOPSY Right 03/20/2019  Procedure: DEEP RIGHT SUPRACLAVICULAR LYMPH NODE EXCISION;  Surgeon: Fanny Skates, MD;  Location: [facility];  Service: General;  Laterality: Right;  MULTIPLE EXTRACTIONS WITH ALVEOLOPLASTY  07/28/2011  Procedure: MULTIPLE EXTRACION WITH ALVEOLOPLASTY;  Surgeon: Lenn Cal, DDS;  Location: [facility];  Service: Oral Surgery;  Laterality: N/A;  Extraction of tooth #'s 2,3,4,5,6,11,12,13,15,19,22 with alveoloplasty.  shoulder repair - right for separation of shoulder    TEE WITHOUT CARDIOVERSION  07/29/2011  Procedure: TRANSESOPHAGEAL ECHOCARDIOGRAM (TEE);  Surgeon: Josue Hector, MD;  Location: [facility];  Service: Cardiovascular;  Laterality: N/A;  TOTAL HIP REVISION Right 09/05/2012  Procedure: RIGHT HIP RESECTION ARTHROPLASTY WITH ANTIBIOTIC SPACERS;  Surgeon: [name], MD;  Location: [facility];  Service: Orthopedics;  Laterality: Right;  TOTAL HIP REVISION Right 11/30/2012  Procedure: RIGHT TOTAL HIP ARTHROPLASTY REIMPLANTATION;  Surgeon: [name], MD;   Location: [facility];  Service: Orthopedics;  Laterality: Right; HPI: Patient is a 65 year old Caucasian  male with medical history significant of chronic tracheostomy after prolonged hospitalization in 2021 related with pneumonia due to [id number] complicated by ARDS and pulmonary fibrosis +/-pneumonitis from brentuximab, s/p PEG, HTN, DM type II, Hodgkin's lymphoma on chemotherapy and GERD.  Patient was discharged from this hospital on 08/25/2020.  Patient was said to have done very well for the first 3 weeks.  Over the last 5 days, patient has been failing to thrive, with associated poor appetite and malaise.  Readmitted with unchanged multifocal PNA.  Subjective: pt alert, eager to go home Assessment / Plan / Recommendation CHL IP CLINICAL IMPRESSIONS 09/20/2020 Clinical Impression Patient presents with excellent oropharyngeal swallowing function. Oral phase timely and with full clearance of bolus. Pharyngeally, strength has improved from previous MBS 03/2020 in that patient has no pharyngeal residue post swallow. Intermittent trace penetration of thin liquids noted, primarily via straw, due to mildly decreased laryngeal closure noted, remaining above the vocal cords and clearing with either subsequent dry swallow or cued throat clear. Tested all consistencies both with and without PMSV in place with no notable difference in swallowing function. Educated patient and spouse (via phone per patient request) on results of testing and recommendations which include use of PMV if able (not necessary) to faciliate improved strength of cough/throat clear if needed. Both verbalized understanding. SLP will f/u briefly for tolerance and continued education as needed. Do not suspect that dysphagia is resulting in recurrent PNAs. Cannot however r/o contribution of PEG tube feedings on aspiration, particularly because patient is also taking pos by mouth in conjunction with tube feeds, and complains of feeling full often. Recommend  dietary consult to ensure that patient is being fed via tube the correct amount and in the correct manner based on how much he consuming by mouth. SLP Visit Diagnosis Dysphagia, pharyngeal phase (R13.13) Attention and concentration deficit following -- Frontal lobe and executive function deficit following -- Impact on safety and function Mild aspiration risk   CHL IP TREATMENT RECOMMENDATION  09/20/2020 Treatment Recommendations Therapy as outlined in treatment plan below   Prognosis 08/23/2020 Prognosis for Safe Diet Advancement Good Barriers to Reach Goals -- Barriers/Prognosis Comment -- CHL IP DIET RECOMMENDATION 09/20/2020 SLP Diet Recommendations Regular solids;Thin liquid Liquid Administration via Cup;Straw Medication Administration Whole meds with liquid Compensations Slow rate;Small sips/bites Postural Changes Remain semi-upright after after feeds/meals (Comment);Seated upright at 90 degrees   CHL IP OTHER RECOMMENDATIONS 09/20/2020 Recommended Consults Other (Comment) Oral Care Recommendations Oral care BID Other Recommendations --   CHL IP FOLLOW UP RECOMMENDATIONS 09/20/2020 Follow up Recommendations None   CHL IP FREQUENCY AND DURATION 09/20/2020 Speech Therapy Frequency (ACUTE ONLY) min 1 x/week Treatment Duration 1 week      CHL IP ORAL PHASE 09/20/2020 Oral Phase WFL Oral - Pudding Teaspoon -- Oral - Pudding Cup -- Oral - Honey Teaspoon -- Oral - Honey Cup -- Oral - Nectar Teaspoon -- Oral - Nectar Cup -- Oral - Nectar Straw -- Oral - Thin Teaspoon -- Oral - Thin Cup -- Oral - Thin Straw -- Oral - Puree -- Oral - Mech Soft -- Oral - Regular -- Oral - Multi-Consistency -- Oral - Pill -- Oral Phase - Comment --  CHL IP PHARYNGEAL PHASE 09/20/2020 Pharyngeal Phase Impaired Pharyngeal- Pudding Teaspoon -- Pharyngeal -- Pharyngeal- Pudding Cup -- Pharyngeal -- Pharyngeal- Honey Teaspoon -- Pharyngeal -- Pharyngeal- Honey Cup -- Pharyngeal -- Pharyngeal- Nectar Teaspoon -- Pharyngeal -- Pharyngeal- Nectar Cup --  Pharyngeal -- Pharyngeal- Nectar Straw -- Pharyngeal -- Pharyngeal- Thin Teaspoon NT Pharyngeal -- Pharyngeal- Thin Cup WFL Pharyngeal -- Pharyngeal- Thin Straw Reduced laryngeal elevation;Penetration/Aspiration during swallow Pharyngeal Material enters airway, remains ABOVE vocal cords and not ejected out Pharyngeal- Puree WFL Pharyngeal -- Pharyngeal- Mechanical Soft WFL Pharyngeal -- Pharyngeal- Regular -- Pharyngeal -- Pharyngeal- Multi-consistency -- Pharyngeal -- Pharyngeal- Pill WFL Pharyngeal -- Pharyngeal Comment --  CHL IP CERVICAL ESOPHAGEAL PHASE 09/20/2020 Cervical Esophageal Phase WFL Pudding Teaspoon -- Pudding Cup -- Honey Teaspoon -- Honey Cup -- Nectar Teaspoon -- Nectar Cup -- Nectar Straw -- Thin Teaspoon -- Thin Cup -- Thin Straw -- Puree -- Mechanical Soft -- Regular -- Multi-consistency -- Pill -- Cervical Esophageal Comment -- McCoy Leah Meryl 09/20/2020, 9:32 AM              ECHOCARDIOGRAM COMPLETE  Result Date: 09/21/2020    ECHOCARDIOGRAM REPORT   Patient Name:   BARNIE SOPKO [facility] Date of Exam: 09/21/2020 Medical Rec #:  [id number]         Height:       65.0 in Accession #:    [id number]        Weight:       139.6 lb Date of Birth:  1956/01/09          BSA:          1.698 m Patient Age:    5 years          BP:           103/66 mmHg Patient Gender: M                 HR:           95 bpm. Exam Location:  Inpatient Procedure: 2D Echo, 3D Echo, Cardiac Doppler and Color Doppler Indications:    Bacteremia  History:        Patient has prior history of Echocardiogram examinations, most  recent 07/09/2019. Abnormal ECG, Arrythmias:Tachycardia,                 Signs/Symptoms:Bacteremia, Shortness of Breath and Dyspnea; Risk                 Factors:Sleep Apnea, Hypertension and Diabetes. Cancer. Right                 pneumothorax. Subcutaneous air. Hypoxia.  Sonographer:    Roseanna Rainbow RDCS Referring Phys: Leander Comments: Technically difficult study due to  poor echo windows, suboptimal parasternal window, suboptimal apical window and suboptimal subcostal window. Patient has trach collar. Off aix medial images in apical region. Feeding tube in subcostal region. IMPRESSIONS  1. Left ventricular ejection fraction, by estimation, is 60 to 65%. The left ventricle has normal function. The left ventricle has no regional wall motion abnormalities. There is mild left ventricular hypertrophy. Left ventricular diastolic parameters were normal.  2. Right ventricular systolic function is normal. The right ventricular size is moderately enlarged. There is normal pulmonary artery systolic pressure.  3. The mitral valve is normal in structure. No evidence of mitral valve regurgitation.  4. The aortic valve was not well visualized. Aortic valve regurgitation is not visualized. No aortic stenosis is present.  5. The inferior vena cava is normal in size with greater than 50% respiratory variability, suggesting right atrial pressure of 3 mmHg. Conclusion(s)/Recommendation(s): Techincally difficult study, but no vegetaion seen. If high clinical supicion for endocarditis, consider TEE. FINDINGS  Left Ventricle: Left ventricular ejection fraction, by estimation, is 60 to 65%. The left ventricle has normal function. The left ventricle has no regional wall motion abnormalities. The left ventricular internal cavity size was small. There is mild left ventricular hypertrophy. Left ventricular diastolic parameters were normal. Right Ventricle: The right ventricular size is moderately enlarged. Right vetricular wall thickness was not well visualized. Right ventricular systolic function is normal. There is normal pulmonary artery systolic pressure. The tricuspid regurgitant velocity is 2.78 m/s, and with an assumed right atrial pressure of 3 mmHg, the estimated right ventricular systolic pressure is 17.4 mmHg. Left Atrium: Left atrial size was normal in size. Right Atrium: Right atrial size was  normal in size. Pericardium: There is no evidence of pericardial effusion. Mitral Valve: The mitral valve is normal in structure. No evidence of mitral valve regurgitation. Tricuspid Valve: The tricuspid valve is normal in structure. Tricuspid valve regurgitation is trivial. Aortic Valve: The aortic valve was not well visualized. Aortic valve regurgitation is not visualized. No aortic stenosis is present. Pulmonic Valve: The pulmonic valve was not well visualized. Pulmonic valve regurgitation is not visualized. Aorta: The aortic root is normal in size and structure. Venous: The inferior vena cava is normal in size with greater than 50% respiratory variability, suggesting right atrial pressure of 3 mmHg. IAS/Shunts: The interatrial septum was not well visualized.  LEFT VENTRICLE PLAX 2D LVIDd:         3.60 cm      Diastology LVIDs:         2.30 cm      LV e' medial:    11.20 cm/s LV PW:         1.30 cm      LV E/e' medial:  8.5 LV IVS:        0.90 cm      LV e' lateral:   12.00 cm/s LVOT diam:     2.20 cm      LV  E/e' lateral: 7.9 LV SV:         57 LV SV Index:   34 LVOT Area:     3.80 cm  LV Volumes (MOD) LV vol d, MOD A2C: 66.9 ml LV vol d, MOD A4C: 101.0 ml LV vol s, MOD A2C: 22.7 ml LV vol s, MOD A4C: 24.2 ml LV SV MOD A2C:     44.2 ml LV SV MOD A4C:     101.0 ml LV SV MOD BP:      78.9 ml RIGHT VENTRICLE             IVC RV S prime:     18.20 cm/s  IVC diam: 1.80 cm TAPSE (M-mode): 2.0 cm LEFT ATRIUM           Index       RIGHT ATRIUM           Index LA diam:      3.40 cm 2.00 cm/m  RA Area:     15.10 cm LA Vol (A2C): 13.3 ml 7.83 ml/m  RA Volume:   34.90 ml  20.56 ml/m LA Vol (A4C): 28.1 ml 16.55 ml/m  AORTIC VALVE LVOT Vmax:   126.00 cm/s LVOT Vmean:  71.400 cm/s LVOT VTI:    0.150 m  AORTA Ao Root diam: 3.40 cm Ao Asc diam:  3.70 cm MITRAL VALVE               TRICUSPID VALVE MV Area (PHT): 5.19 cm    TR Peak grad:   30.9 mmHg MV Decel Time: 146 msec    TR Vmax:        278.00 cm/s MV E velocity: 95.14  cm/s MV A velocity: 84.80 cm/s  SHUNTS MV E/A ratio:  1.12        Systemic VTI:  0.15 m                            Systemic Diam: 2.20 cm Oswaldo Milian MD Electronically signed by Oswaldo Milian MD Signature Date/Time: 09/21/2020/6:08:46 PM    Final    VAS [place] LOWER EXTREMITY VENOUS (DVT)  Result Date: 09/25/2020  Lower Venous DVT Study Patient Name:  ASTON LIESKE [facility]  Date of Exam:   09/24/2020 Medical Rec #: [id number]          Accession #:    [id number] Date of Birth: 02/03/1956           Patient Gender: M Patient Age:   64Y Exam Location:  [facility] Procedure:      VAS [place] LOWER EXTREMITY VENOUS (DVT) Referring Phys: [id number] MURALI RAMASWAMY --------------------------------------------------------------------------------  Indications: Post-Covid severe pulmonary fibrosis, relapsed Hodkin's lymphoma, edema.  Comparison       07-09-2019 Bilateral lower extremity venous was negative for Study:           DVT. Performing Technologist: Darlin Coco RDMS,RVT  Examination Guidelines: A complete evaluation includes B-mode imaging, spectral Doppler, color Doppler, and power Doppler as needed of all accessible portions of each vessel. Bilateral testing is considered an integral part of a complete examination. Limited examinations for reoccurring indications may be performed as noted. The reflux portion of the exam is performed with the patient in reverse Trendelenburg.  +---------+---------------+---------+-----------+----------+--------------+ RIGHT    CompressibilityPhasicitySpontaneityPropertiesThrombus Aging +---------+---------------+---------+-----------+----------+--------------+ CFV      Full           Yes      Yes                                 +---------+---------------+---------+-----------+----------+--------------+  SFJ      Full                                                        +---------+---------------+---------+-----------+----------+--------------+ FV Prox   Full                                                        +---------+---------------+---------+-----------+----------+--------------+ FV Mid   Full                                                        +---------+---------------+---------+-----------+----------+--------------+ FV DistalFull                                                        +---------+---------------+---------+-----------+----------+--------------+ PFV      Full                                                        +---------+---------------+---------+-----------+----------+--------------+ POP      Full           Yes      Yes                                 +---------+---------------+---------+-----------+----------+--------------+ PTV      Full                                                        +---------+---------------+---------+-----------+----------+--------------+ PERO     Full                                                        +---------+---------------+---------+-----------+----------+--------------+   +---------+---------------+---------+-----------+----------+--------------+ LEFT     CompressibilityPhasicitySpontaneityPropertiesThrombus Aging +---------+---------------+---------+-----------+----------+--------------+ CFV      Full           Yes      Yes                                 +---------+---------------+---------+-----------+----------+--------------+ SFJ      Full                                                        +---------+---------------+---------+-----------+----------+--------------+  FV Prox  Full                                                        +---------+---------------+---------+-----------+----------+--------------+ FV Mid   Full                                                        +---------+---------------+---------+-----------+----------+--------------+ FV DistalFull                                                         +---------+---------------+---------+-----------+----------+--------------+ PFV      Full                                                        +---------+---------------+---------+-----------+----------+--------------+ POP      Full           Yes      Yes                                 +---------+---------------+---------+-----------+----------+--------------+ PTV      Full                                                        +---------+---------------+---------+-----------+----------+--------------+ PERO     Full                                                        +---------+---------------+---------+-----------+----------+--------------+     Summary: RIGHT: - There is no evidence of deep vein thrombosis in the lower extremity.  - No cystic structure found in the popliteal fossa.  - Ultrasound characteristics of enlarged lymph nodes are noted in the groin.  LEFT: - There is no evidence of deep vein thrombosis in the lower extremity.  - No cystic structure found in the popliteal fossa.  *See table(s) above for measurements and observations. Electronically signed by Harold Barban MD on 09/25/2020 at 10:25:35 PM.    Final    [place] EKG SITE RITE  Result Date: 10/06/2020 If Site Rite image not attached, placement could not be confirmed due to current cardiac rhythm.  US Abdomen Limited RUQ (LIVER/GB)  Result Date: 09/19/2020 CLINICAL DATA:  Transaminitis. EXAM: ULTRASOUND ABDOMEN LIMITED RIGHT UPPER QUADRANT COMPARISON:  None. FINDINGS: Gallbladder: No gallbladder wall thickening. Small amount of biliary sludge. No sonographic Murphy sign noted by sonographer. Common bile duct: Diameter: 3 mm, normal Liver: No focal lesion identified. Within normal limits in  parenchymal echogenicity. Portal vein is patent on color Doppler imaging with normal direction of blood flow towards the liver. Other: None. IMPRESSION: Biliary sludge without evidence of acute cholecystitis.  Electronically Signed   By: Valentino Saxon MD   On: 09/19/2020 16:18    Lab Data:  CBC: Recent Labs  Lab 10/03/20 0317 10/04/20 0247 10/06/20 0237  WBC 12.5* 15.9* 13.1*  HGB 10.0* 10.5* 11.2*  HCT 34.3* 36.2* 39.1  MCV 99.1 99.5 100.0  PLT 254 248 735   Basic Metabolic Panel: Recent Labs  Lab 10/05/20 0251 10/06/20 0237 10/07/20 0235 10/08/20 0258 10/09/20 0500  NA 142 142  144 141 141 140  K 4.0 4.7  4.8 4.6 4.4 4.3  CL 92* 92*  91* 89* 89* 88*  CO2 46* 45*  45* 46* 46* >50*  GLUCOSE 116* 86  85 93 99 134*  BUN 27* 27*  27* 25* 28* 23  CREATININE 0.57* 0.52*  0.59* 0.47* 0.49* 0.43*  CALCIUM 10.1 10.5*  10.5* 10.1 10.1 10.2  MG  --  2.4  --   --   --   PHOS 5.3* 4.2  4.2 3.6 3.4 3.7   GFR: Estimated Creatinine Clearance: 81.1 mL/min (A) (by C-G formula based on SCr of 0.43 mg/dL (L)). Liver Function Tests: Recent Labs  Lab 10/05/20 0251 10/06/20 0237 10/07/20 0235 10/08/20 0258 10/09/20 0500  ALBUMIN 2.4* 2.6* 2.7* 2.7* 2.8*   No results for input(s): LIPASE, AMYLASE in the last 168 hours. No results for input(s): AMMONIA in the last 168 hours. Coagulation Profile: Recent Labs  Lab 10/04/20 1005  INR 1.0   Cardiac Enzymes: No results for input(s): CKTOTAL, CKMB, CKMBINDEX, TROPONINI in the last 168 hours. BNP (last 3 results) No results for input(s): PROBNP in the last 8760 hours. HbA1C: No results for input(s): HGBA1C in the last 72 hours. CBG: Recent Labs  Lab 10/08/20 1932 10/08/20 2345 10/09/20 0337 10/09/20 0735 10/09/20 1135  GLUCAP 90 140* 112* 122* 147*   Lipid Profile: No results for input(s): CHOL, HDL, LDLCALC, TRIG, CHOLHDL, LDLDIRECT in the last 72 hours. Thyroid Function Tests: No results for input(s): TSH, T4TOTAL, FREET4, T3FREE, THYROIDAB in the last 72 hours. Anemia Panel: No results for input(s): VITAMINB12, FOLATE, FERRITIN, TIBC, IRON, RETICCTPCT in the last 72 hours. Urine analysis:    Component Value  Date/Time   COLORURINE YELLOW 09/19/2020 1511   APPEARANCEUR HAZY (A) 09/19/2020 1511   LABSPEC 1.012 09/19/2020 1511   PHURINE 7.0 09/19/2020 1511   GLUCOSEU NEGATIVE 09/19/2020 1511   HGBUR NEGATIVE 09/19/2020 1511   HGBUR negative 03/23/2007 0000   BILIRUBINUR NEGATIVE 09/19/2020 1511   BILIRUBINUR neg 11/11/2016 1017   KETONESUR NEGATIVE 09/19/2020 1511   PROTEINUR NEGATIVE 09/19/2020 1511   UROBILINOGEN 0.2 11/11/2016 1017   UROBILINOGEN 0.2 11/08/2012 0915   NITRITE NEGATIVE 09/19/2020 1511   LEUKOCYTESUR NEGATIVE 09/19/2020 1511     Nathalie Cavendish M.D. [facility] 10/09/2020, 1:21 PM  Available via Epic secure chat 7am-7pm After 7 pm, please refer to night coverage provider listed on amion.

## 2020-10-09 NOTE — Progress Notes (Addendum)
Physical Therapy Treatment Patient Details Name: Steve [name] MRN: [id number] DOB: Jan 28, 1956 Today's Date: 10/09/2020    History of Present Illness Patient  is a 65 year old male who was admitted for possible pneumonia and found to have a staph epidermidis bacteremia. 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  0n 09/24/20, developed respiratory distress with increased oxygen requirement and was transferred to ICU and started on pressure support ventilation. Currently on trach collar on 10L fiO2 at 60% with intermittent vent support.    PT Comments    Pt OOB in recliner on 10L at 60%. RN advised to continue to wear NRB over [facility] at 15L for ambulation. Increased tolerance for activity this session. Pt ambulated 42 feet using EVA walker with 2 rest breaks needed at 5 mins each due to SOB and fatigue. Recliner following closely behind for safety. Positioned in recliner to comfort.   VITALS: At rest dependent sitting -  HR 93, 99%, 114/67 After 10' - HR 101, 92% 5 min rest After 15' - HR 104, 98% 5 min rest After 17' - HR 92, 94% Recovery - HR 77, 98%, 119/70  Follow Up Recommendations  No PT follow up     Equipment Recommendations  None recommended by PT    Recommendations for Other Services       Precautions / Restrictions Precautions Precautions: Fall Precaution Comments: trach collar at 11 L: 60% fio2, intermittent vent use, PEG tube, Restrictions Weight Bearing Restrictions: No    Mobility  Bed Mobility    Transfers Overall transfer level: Needs assistance Equipment used: Bilateral platform walker Transfers: Sit to/from Stand Sit to Stand: Min assist Stand pivot transfers: Min guard    25%  VC's on proper hand transfer to push up from recliner to proper hand placement on EVA walker.      Ambulation/Gait Ambulation/Gait assistance: Mod assist;+2 safety/equipment Gait Distance (Feet): 42 Feet Assistive device: Bilateral platform walker Gait Pattern/deviations: Step-to pattern Gait velocity: decreased   General Gait Details: increased ambulation in 3 segements for 42 feet total ( 10 feet, 15 feet, 17 feet)  with 5 min rest breaks in betweem d/t pt c/o SOB and fatigue.   Stairs             Wheelchair Mobility    Modified Rankin (Stroke Patients Only)       Balance   Sitting-balance support: No upper extremity supported;Feet supported Sitting balance-Leahy Scale: Fair Sitting balance - Comments: cues to not use arms to prop                                    Cognition Arousal/Alertness: Awake/alert Behavior During Therapy: WFL for tasks assessed/performed Overall Cognitive Status: Within Functional Limits for tasks assessed                                 General Comments: AxO x 3 very motivated.  Non verbal due to [facility] but does mouth words and use hand gestures      Exercises Other Exercises Other Exercises: Marching in place x 27 seconds, hip hikes x 3 ( limited  by dyspnea), and leaning forward and back with hands across chest to work on core strengthening> limited by dyspnea despite normal o2 sats.    General Comments        Pertinent Vitals/Pain Pain Assessment: No/denies pain    Home Living                      Prior Function            PT Goals (current goals can now be found in the care plan section) Acute Rehab PT Goals Patient Stated Goal: walk again PT Goal Formulation: With patient Time For Goal Achievement: 10/15/20 Potential to Achieve Goals: Good Progress towards PT goals: Progressing toward goals    Frequency    Min 3X/week      PT Plan Current plan remains appropriate     Co-evaluation              AM-PAC PT "6 Clicks" Mobility   Outcome Measure  Help needed turning from your back to your side while in a flat bed without using bedrails?: A Little Help needed moving from lying on your back to sitting on the side of a flat bed without using bedrails?: A Little Help needed moving to and from a bed to a chair (including a wheelchair)?: A Little Help needed standing up from a chair using your arms (e.g., wheelchair or bedside chair)?: A Lot Help needed to walk in hospital room?: A Lot Help needed climbing 3-5 steps with a railing? : Total 6 Click Score: 14    End of Session Equipment Utilized During Treatment: Gait belt Activity Tolerance: Patient tolerated treatment well;Patient limited by fatigue Patient left: in chair;with call bell/phone within reach Nurse Communication: Mobility status PT Visit Diagnosis: Unsteadiness on feet (R26.81);Muscle weakness (generalized) (M62.81)     Time:  - 9:20 - 9:50    Charges:    2 gt                    Ernst Spell, PTA Student  Acute Rehabilitation Services Pager : [phone] Office : [phone]    Ernst Spell 10/09/2020, 1:02 PM  I agree with the following treatment note.  This session was performed under the supervision of a licensed clinician  Rica Koyanagi  PTA Acute  Rehabilitation Services Pager      [phone] Office      [phone]

## 2020-10-09 NOTE — Progress Notes (Signed)
NAME:  Steve [name], MRN:  [id number], DOB:  May 20, 1955, LOS: 64 ADMISSION DATE:  09/19/2020, CONSULTATION DATE:  09/24/20 REFERRING MD:  Dr Nolberto Hanlon, CHIEF COMPLAINT:  Resp failure   HPI   65 year old male who is well kwn to PCCM service from springs-summer 2021 for s/p covid, respiratoy failure, ARDS, pneumothorax, prolonged mechanical ventilation (? 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  At baseline.   Port-A-Cath placement in December 2020 and gastrostomy tube placement in May 2021.  Of note , has Hodgkins Lymphoma  with recuirrence March 2022 (Bulky intrathoracic adenopathy esp new substernal and newosseous metastatic disease, PET 06/19/2020.). As of April 2022 got 2nd dose Adcetris.  The admitted May 2022 for "pneumonia" and Acetris help (in part due to high LFT) and all Rx held.  Also as a preadmission problem: Chronic caxhexia, protein calorie malnutrition - moderate, failure thrive, DM all as his baseline prior to admission problems/ his baseline ECOG May 2022 was 2  Admitted 09/19/20 (recent baseline 8LO2 via trach). With worsening cough, lethargy.  Blood culture came back positive with Staphylococcus epidermis on 09/22/20 (all 4 of 4 cutuirs), infectious disease and s/p Port-A-Cath removal 09/22/20. As a present on admission problem O 09/22/20 - was needed 10L O2. CT scan this admit 09/20/2020 shwoed worrsening lymphadenopathy in chest concerning for Hodgkins progression. On 6/15 - Seen by Dr Marin Olp and recommended ICE protocol when better (highly immune suppressive regimen). ID had recommended 2 week Rx  . On 6/16 - reports he is more hypoxemic needeing >90% fio2 via Trach collar though no increased work of breathing. CCM consulted  Remains full code despite multiple conversations  Pertinent  Medical History  Covid admission March - May 2021 Chronic vent and trach  dependent  Significant Hospital Events: Including procedures, antibiotic start and stop dates in addition to other pertinent events   6/16 - ccm consult  6/17 -CT angiogram ruled out pulmonary embolism.  He has progressive Hodgkin's disease on the CT scan of the chest.  He was unable to sustain facemask oxygen and has required being placed on pressure support ventilation through the tracheostomy.  He now has a cuffed 6 sized tracheostomy.  Currently on 50% oxygen and saturating well and more comfortable. 6/18 -stuck on pressure support ventilator at 40% FiO2.  Unable to wean off.  He is watching TV and is able to eat 6/22 slowly progressing PSV wean 6/24 tolerating ATC 6/25 tolerated ATC for about 1 and half hours 6/26 very short tolerance of ATC, back on vent quickly due to dyspnea 6/27 off vent all day and night  Interim History / Subjective:  No events, remains on TC.  Denies pain or SOB  Objective   Blood pressure (!) 106/59, pulse 81, temperature 97.9 F (36.6 C), temperature source Oral, resp. rate (!) 25, height 5\' 5"  (1.651 m), weight 69 kg, SpO2 99 %.    FiO2 (%):  [60 %-70 %] 60 %   Intake/Output Summary (Last 24 hours) at 10/09/2020 0752 Last data filed at 10/09/2020 0700 Gross per 24 hour  Intake 600.06 ml  Output 1750 ml  Net -1149.94 ml    Filed Weights   09/19/20 1930 09/26/20 0258 09/30/20 1646  Weight: 63.3 kg 63.3 kg 69 kg    Examination: Chronically ill appearing man lying in bed MM dry Trach in place with minimal secretions Lung sounds diminished Abd  soft Globally weak Oriented  Labs/imaging that I have personally reviewed  (right click and "Reselect all SmartList Selections" daily)  Very high bicarb   Resolved Hospital Problem list    Assessment & Plan:   Acute on chronic respiratory failure- baseline 8L TC. 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 Significant tracheobronchomalacia on CT  -Con't TC indefinitely -BNP WNL; no role for diuretics   Deconditioning -PT, OT  -planning to go home with [facility]  Sepsis due to MRSE bacteremia -resolved -Appreciate infectious disease input -Remains on vancomycin to complete course -Off steroids -per cardiology, no TEE. Plan for 4 weeks antibiotics  Relapsed Hodgkin's lymphoma Bulky mediastinal adenopathy on CT on 6/17 -not currently a candidate for treatment, but he remains hopeful  Will see again Tuesday, reach out if any questions or concerns  Best practice (right click and "Reselect all SmartList Selections" daily)  Per primary   Candee Furbish, MD  10/09/20 7:52 AM [facility]

## 2020-10-10 DIAGNOSIS — G620 Drug-induced polyneuropathy: Secondary | ICD-10-CM | POA: Diagnosis not present

## 2020-10-10 DIAGNOSIS — J9611 Chronic respiratory failure with hypoxia: Secondary | ICD-10-CM | POA: Diagnosis not present

## 2020-10-10 DIAGNOSIS — R7881 Bacteremia: Secondary | ICD-10-CM | POA: Diagnosis not present

## 2020-10-10 DIAGNOSIS — R627 Adult failure to thrive: Secondary | ICD-10-CM | POA: Diagnosis not present

## 2020-10-10 LAB — RENAL FUNCTION PANEL
Albumin: 2.8 g/dL — ABNORMAL LOW (ref 3.5–5.0)
Anion gap: 7 (ref 5–15)
BUN: 32 mg/dL — ABNORMAL HIGH (ref 8–23)
CO2: 45 mmol/L — ABNORMAL HIGH (ref 22–32)
Calcium: 10.7 mg/dL — ABNORMAL HIGH (ref 8.9–10.3)
Chloride: 90 mmol/L — ABNORMAL LOW (ref 98–111)
Creatinine, Ser: 0.64 mg/dL (ref 0.61–1.24)
GFR, Estimated: >60 mL/min (ref >60)
Glucose, Bld: 138 mg/dL — ABNORMAL HIGH (ref 70–99)
Phosphorus: 4.8 mg/dL — ABNORMAL HIGH (ref 2.5–4.6)
Potassium: 4.1 mmol/L (ref 3.5–5.1)
Sodium: 142 mmol/L (ref 135–145)

## 2020-10-10 LAB — GLUCOSE, CAPILLARY
Glucose-Capillary: 135 mg/dL — ABNORMAL HIGH (ref 70–99)
Glucose-Capillary: 121 mg/dL — ABNORMAL HIGH (ref 70–99)
Glucose-Capillary: 138 mg/dL — ABNORMAL HIGH (ref 70–99)
Glucose-Capillary: 153 mg/dL — ABNORMAL HIGH (ref 70–99)
Glucose-Capillary: 119 mg/dL — ABNORMAL HIGH (ref 70–99)
Glucose-Capillary: 121 mg/dL — ABNORMAL HIGH (ref 70–99)

## 2020-10-10 MED ORDER — MIDODRINE HCL 5 MG PO TABS
10.0000 mg | ORAL_TABLET | Freq: Three times a day (TID) | ORAL | Status: DC
  Administered 2020-10-10 – 2020-10-16 (×17): 10 mg
  Filled 2020-10-10 (×17): qty 2

## 2020-10-10 NOTE — Progress Notes (Signed)
[facility]                                                                              Patient Demographics  Steve [name], is a 65 y.o. 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  First negative blood cultures 09/22/20.  0n 09/24/20, patient developed respiratory distress with increased oxygen requirement and was transferred to ICU and started on pressure support ventilation.  Critical care and ID consulted.   ID recommended TEE to rule out infective endocarditis, however not stable to have TEE at this time per cardiology.    ID recommended 6 weeks of IV antibiotics from first negative blood cultures on 09/22/20, if unable to have TEE.  Cardiology was reconsulted to reassess if patient is stable for TEE on 6/28 however still having descending episodes and unstable for TEE..     Assessment & Plan    Principal Problem: Sepsis due to pneumonia, staph epidermidis bacteremia: Present on admission -Met sepsis criteria on presentation with tachycardia, tachypnea, leukocytosis, chest x-ray with possible recurrent pneumonia -CT chest with interval increase in bulk of mediastinal and axillary adenopathy. -Blood cultures grew staph  epidermidis, Port-A-Cath was removed on 6/16. -Patient remained on cefepime from 6/11-6/15.  ID recommended TEE to exclude endocarditis, however patient has been unstable for TEE -Continue midodrine for hypotension, stress dose steroids tapered off on 6/24 -ID recommended vancomycin for minimum 6 weeks from date of negative culture on 6/14, will need 4 more weeks upon discharge (if TEE not done). 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 x1 on 7/1 for bicarb > 50 -Bicarb 45 today, monitor closely may need another Diamox this week if trending up   Relapsed Hodgkin lymphoma -CT chest concerning for worsening mediastinal and axillary lymphadenopathy -  Oncology and palliative medicine following -Seen by Dr. Myna Hidalgo on 6/20, PICC line has been placed  Diabetes mellitus type 2, NIDDM with hyperglycemia, uncontrolled -CBGs stable -Continue Levemir 10 units nightly, SSI sensitive sensitive    Normocytic anemia, anemia of chronic disease -Check H&H  Hyponatremia, hypokalemia -Currently resolved   Pressure injury -Sacrum mid stage I -Wound care per RN  Severe malnutrition secondary to chronic illness, malignancy -Continue tube feeds -  Estimated body mass index is 25.31 kg/m as calculated from the following:   Height as of this encounter: 5'  5" (1.651 m).   Weight as of this encounter: 69 kg.  Code Status: full  DVT Prophylaxis:  enoxaparin (LOVENOX) injection 40 mg Start: 09/19/20 2200   Level of Care: Level of care: ICU Family Communication: Discussed all imaging results, lab results, explained patient's wife on phone [phone] on 7/1   Disposition Plan:     Status is: Inpatient  Remains inpatient appropriate because:Inpatient level of care appropriate due to severity of illness  Dispo: The patient is from: Home              Anticipated d/c is to: Home              Patient currently is not medically stable to d/c.  Once respiratory status is close to his baseline   Difficult to place patient No      Time Spent in minutes    Procedures:  Mech vent   Consultants:   Pulmonary critical care Oncology ID Palliative medicine    Antimicrobials:   Anti-infectives (From admission, onward)    Start     Dose/Rate Route Frequency Ordered Stop   09/23/20 0800  ceFEPIme (MAXIPIME) 2 g in sodium chloride 0.9 % 100 mL IVPB  Status:  Discontinued        2 g 200 mL/hr over 30 Minutes Intravenous Every 8 hours 09/23/20 0738 09/23/20 1758   09/21/20 0400  vancomycin (VANCOREADY) IVPB 750 mg/150 mL        750 mg 150 mL/hr over 60 Minutes Intravenous Every 12 hours 09/20/20 1715 11/04/20 0559   09/20/20 1730  vancomycin (VANCOREADY) IVPB 750 mg/150 mL        750 mg 150 mL/hr over 60 Minutes Intravenous STAT 09/20/20 1714 09/20/20 1954   09/20/20 0400  vancomycin (VANCOREADY) IVPB 750 mg/150 mL  Status:  Discontinued        750 mg 150 mL/hr over 60 Minutes Intravenous Every 12 hours 09/19/20 1932 09/20/20 1155   09/20/20 0000  ceFEPIme (MAXIPIME) 2 g in sodium chloride 0.9 % 100 mL IVPB  Status:  Discontinued        2 g 200 mL/hr over 30 Minutes Intravenous Every 8 hours 09/19/20 1932 09/23/20 0721   09/19/20 1600  ceFEPIme (MAXIPIME) 2 g in sodium chloride 0.9 % 100 mL IVPB        2 g 200 mL/hr over 30  Minutes Intravenous  Once 09/19/20 1548 09/19/20 1634   09/19/20 1600  vancomycin (VANCOREADY) IVPB 1250 mg/250 mL        1,250 mg 166.7 mL/hr over 90 Minutes Intravenous STAT 09/19/20 1557 09/19/20 1822          Medications  Scheduled Meds:  chlorhexidine gluconate (MEDLINE KIT)  15 mL Mouth Rinse BID   Chlorhexidine Gluconate Cloth  6 each Topical Q0600   cholecalciferol  10,000 Units Oral Daily   enoxaparin (LOVENOX) injection  40 mg Subcutaneous Q24H  free water  100 mL Per Tube Q6H   insulin aspart  0-9 Units Subcutaneous Q4H   insulin glargine  10 Units Subcutaneous QHS   mouth rinse  15 mL Mouth Rinse BID   midodrine  5 mg Per Tube TID WC   multivitamin  15 mL Per Tube Daily   pantoprazole sodium  40 mg Per Tube Daily   sertraline  50 mg Per Tube Daily   sodium chloride flush  10-40 mL Intracatheter Q12H   traZODone  100 mg Per Tube QHS   Continuous Infusions:  sodium chloride 20 mL/hr at 10/04/20 1343   feeding supplement (VITAL AF 1.2 CAL) 1,000 mL (10/10/20 1056)   vancomycin Stopped (10/10/20 0715)   PRN Meds:.albuterol, guaiFENesin, hyoscyamine, LORazepam, sodium chloride flush      Subjective:   Kees Idrovo was seen and examined today.  Appears somewhat short of breath with shallow breathing, BP running soft.  No fevers, tolerating tube feeds.  Objective:   Vitals:   10/10/20 0836 10/10/20 1000 10/10/20 1100 10/10/20 1130  BP:  (!) 74/41 (!) 92/49   Pulse:  65 72 71  Resp:    16  Temp: 97.6 F (36.4 C)     TempSrc: Oral     SpO2:  97% 93% 93%  Weight:      Height:        Intake/Output Summary (Last 24 hours) at 10/10/2020 1241 Last data filed at 10/10/2020 1155 Gross per 24 hour  Intake 3997.02 ml  Output 1125 ml  Net 2872.02 ml     Wt Readings from Last 3 Encounters:  09/30/20 69 kg  09/18/20 64.4 kg  09/08/20 64.4 kg    Physical Exam General: Alert and oriented, shallow breathing Cardiovascular: S1 S2 clear, RRR. No pedal  edema b/l Respiratory: Bilateral rhonchi Gastrointestinal: Soft, nontender, nondistended, NBS, PEG tube Ext: no pedal edema bilaterally Psych: flat affect   Data Reviewed:  I have personally reviewed following labs and imaging studies  Micro Results No results found for this or any previous visit (from the past 240 hour(s)).  Radiology Reports DG Chest 2 View  Result Date: 09/18/2020 CLINICAL DATA:  Acute on chronic respiratory failure. EXAM: CHEST - 2 VIEW COMPARISON:  Aug 15, 2020. FINDINGS: Stable cardiomegaly. Tracheostomy tube is in good position. Left-sided Port-A-Cath is unchanged. Stable bilateral lung opacities are noted concerning for multifocal pneumonia or chronic scarring. No pneumothorax or pleural effusion is noted. Bony thorax is unremarkable. IMPRESSION: Stable bilateral lung opacities as described above. Electronically Signed   By: Marijo Conception M.D.   On: 09/18/2020 12:59   CT CHEST WO CONTRAST  Result Date: 09/20/2020 CLINICAL DATA:  Respiratory failure.  History of Hodgkin's lymphoma. EXAM: CT CHEST WITHOUT CONTRAST TECHNIQUE: Multidetector CT imaging of the chest was performed following the standard protocol without IV contrast. COMPARISON:  July 21, 2020 FINDINGS: Cardiovascular: Heart is mildly enlarged. LEFT chest port tip terminates at the superior cavoatrial junction. No pericardial effusion. Minimal atherosclerotic calcifications. Enlarged pulmonary artery as can be seen in pulmonary arterial hypertension. Mediastinum/Nodes: Revisualization of extensive adenopathy. LEFT level 3 axillary lymph node measures 11 mm, previously 3 mm (series 2, image 42). LEFT supraclavicular lymph node measures 15 mm, previously 15 mm (series 2, image 24). RIGHT level 1 axillary lymph node measures 8 mm, previously 4 mm (series 2, image 63). RIGHT internal mammary lymph node measures 31 mm, previously 26 mm (series 2, image 77). RIGHT paratracheal conglomeration spanned 59 x 43  mm,  previously 44 by 30 mm (series 2, image 45). There is a lunate configuration of the trachea. Lungs/Pleura: Visualization of a peripheral rind of fluid adjacent to the RIGHT lung, similar in comparison to prior. Extensive bilateral ground-glass opacities with subpleural reticulation and suggestion of honeycombing. Peribronchovascular nodularity is similar in comparison to prior. Similar appearance of RIGHT middle lobe atelectasis. Upper Abdomen: No acute abnormality. Musculoskeletal: Known osseous metastatic disease is not well visualized. Limited evaluation of the ribs secondary to respiratory motion. No acute thoracic spine fracture. IMPRESSION: 1. Interval increase in bulky adenopathy within the mediastinum consistent with known lymphoma. Increased axillary lymph node size in comparison to prior. 2. Revisualization of sequela of advanced pulmonary fibrosis. Unchanged appearance of peribronchovascular nodularity. This could reflect lymphangitic spread of lymphoma versus underlying infection. 3. Unchanged small rind of RIGHT-sided pleural fluid. Underlying pleural nodularity from lymphoma remains in the differential. 4. 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 5. Enlarged pulmonary artery as can be seen in pulmonary arterial hypertension. Electronically Signed   By: Valentino Saxon MD   On: 09/20/2020 17:25   CT Angio Chest Pulmonary Embolism (PE) W or WO Contrast  Result Date: 09/25/2020 CLINICAL DATA:  Cough and hypoxia.  COVID-19. EXAM: CT ANGIOGRAPHY CHEST WITH CONTRAST TECHNIQUE: Multidetector CT imaging of the chest was performed using the standard protocol during bolus administration of intravenous contrast. Multiplanar CT image reconstructions and MIPs were obtained to evaluate the vascular anatomy. CONTRAST:  77mL OMNIPAQUE IOHEXOL 350 MG/ML SOLN COMPARISON:  09/20/2020 FINDINGS: Cardiovascular: Contrast injection is sufficient to demonstrate satisfactory  opacification of the pulmonary arteries to the segmental level. There is no pulmonary embolus or evidence of right heart strain. The size of the main pulmonary artery is normal. Heart size is normal, with no pericardial effusion. The course and caliber of the aorta are normal. There is no atherosclerotic calcification. No acute aortic syndrome. Mediastinum/Nodes: Bulky mediastinal adenopathy is unchanged since the recent study of 09/20/2020. Lungs/Pleura: Tracheostomy tube tip is at the level of the clavicular heads. There is increased pleural fluid on the right. Diffuse pleural thickening is unchanged. Unchanged appearance of diffuse parenchymal interstitial abnormality. Upper Abdomen: Contrast bolus timing is not optimized for evaluation of the abdominal organs. The visualized portions of the organs of the upper abdomen are normal. Musculoskeletal: No chest wall abnormality. No bony spinal canal stenosis. Review of the MIP images confirms the above findings. IMPRESSION: 1. No pulmonary embolus or acute aortic syndrome. 2. Bulky mediastinal adenopathy and diffuse pleural, interstitial and parenchymal pulmonary abnormalities are unchanged since 09/20/2020. Electronically Signed   By: Ulyses Jarred M.D.   On: 09/25/2020 02:54   IR REMOVAL TUN ACCESS W/ PORT W/O FL MOD SED  Result Date: 09/22/2020 INDICATION: 65 year old male with a history of Hodgkin lymphoma currently presenting with bacteremia associated with pneumonia. He has a left chest port catheter which was placed by interventional radiology (Dr. Annamaria Boots) in December of 2020. he presents for port catheter removal to help facilitate clearance of bacteremia. EXAM: REMOVAL RIGHT IJ VEIN PORT-A-CATH MEDICATIONS: None ANESTHESIA/SEDATION: Moderate (conscious) sedation was employed during this procedure. A total of Versed 1 mg and Fentanyl 50 mcg was administered intravenously. Moderate Sedation Time: 12 minutes. The patient's level of consciousness and vital  signs were monitored continuously by radiology nursing throughout the procedure under my direct supervision. FLUOROSCOPY TIME:  None. COMPLICATIONS: None immediate. PROCEDURE: Informed written consent was obtained from the patient after a thorough discussion of the procedural  risks, benefits and alternatives. All questions were addressed. Maximal Sterile Barrier Technique was utilized including caps, mask, sterile gowns, sterile gloves, sterile drape, hand hygiene and skin antiseptic. A timeout was performed prior to the initiation of the procedure. The right chest was prepped and draped in a sterile fashion. Lidocaine was utilized for local anesthesia. An incision was made over the previously healed surgical incision. Utilizing blunt dissection, the port catheter and reservoir were removed from the underlying subcutaneous tissue in their entirety. Securing sutures were also removed. The pocket was irrigated with a copious amount of sterile normal saline. The wound was examined. No evidence of purulence. The pocket was closed with interrupted 3-0 Vicryl stitches. The subcutaneous tissue was closed with 3-0 Vicryl interrupted subcutaneous stitches. Dermabond was applied. IMPRESSION: Successful right IJ vein Port-A-Cath explant. Electronically Signed   By: Jacqulynn Cadet M.D.   On: 09/22/2020 16:18   DG CHEST PORT 1 VIEW  Result Date: 09/24/2020 CLINICAL DATA:  Respiratory distress EXAM: PORTABLE CHEST 1 VIEW COMPARISON:  09/19/2020 FINDINGS: Lung volumes are small with elevation of the right hemidiaphragm, stable since prior examination. Superimposed multifocal pulmonary infiltrates likely related to chronic underlying interstitial lung disease appears stable, better assessed on CT examination of 09/20/2020. Superimposed right pleural thickening is again noted. Tracheostomy unchanged. Cardiac size is enlarged with associated superior mediastinal widening again noted related to extensive mediastinal and  pericardial adenopathy better demonstrated on prior CT examination. No acute bone abnormality. IMPRESSION: Stable diffuse pulmonary infiltrates likely related to chronic underlying interstitial lung disease and asymmetric right pleural thickening. Stable enlargement of the cardiomediastinal silhouette related to extensive mediastinal and pericardial adenopathy better appreciated on prior CT examination. Electronically Signed   By: Fidela Salisbury MD   On: 09/24/2020 11:41   DG Chest Port 1 View  Result Date: 09/19/2020 CLINICAL DATA:  Shortness of breath. EXAM: PORTABLE CHEST 1 VIEW COMPARISON:  Chest x-ray dated September 18, 2020. FINDINGS: Unchanged tracheostomy tube and left chest wall port catheter. Stable cardiomegaly and mediastinal widening related to underlying lymphadenopathy. Unchanged diffuse patchy nodular opacities throughout both lungs superimposed on chronic pulmonary fibrosis. Unchanged small right pleural effusion. No pneumothorax. No acute osseous abnormality. IMPRESSION: 1. Unchanged multifocal pneumonia superimposed on chronic severe pulmonary fibrosis. 2. Unchanged mediastinal lymphadenopathy. Electronically Signed   By: Titus Dubin M.D.   On: 09/19/2020 15:39   DG Swallowing Func-Speech Pathology  Result Date: 09/20/2020 Formatting of this result is different from the original. Objective Swallowing Evaluation: Type of Study: MBS-Modified Barium Swallow Study  Patient Details Name: JOVANNI RASH MRN: [id number] Date of Birth: 1955-05-11 Today's Date: 09/20/2020 Time: SLP Start Time (ACUTE ONLY): 0903 -SLP Stop Time (ACUTE ONLY): 2979 SLP Time Calculation (min) (ACUTE ONLY): 20 min Past Medical History: Past Medical History: Diagnosis Date  Abscess of muscle 08/10/2011  staph infection of right hip   Acute on chronic respiratory failure with hypoxia ([name])   Acute respiratory distress syndrome (ARDS) due to COVID-19 virus ([place])   Anxiety   Diabetes mellitus without complication ([name])   GERD  (gastroesophageal reflux disease)   rare reflux - no meds for reflux - NO PROBLEM IN PAST SEVERAL YRS  HCAP (healthcare-associated pneumonia)   Hip dysplasia, congenital   no surgery as a child for hip dysplasia - has had bilateral hip replacements as an adult  Hodgkin lymphoma ([place])   Hypertension   Pancreatitis   Pneumothorax, acute   Postoperative anemia due to acute blood loss 09/07/2012  Septic arthritis of  hip ([place]) 09/05/2012  PT'S TOTAL HIP JOINT REMOVED - ANTIBIOTIC SPACE PLACED AND PT HAS FINISHED IV ANTIBIOTICS ( PICC LINE REMOVED)  Sleep apnea   USES CPAP Past Surgical History: Past Surgical History: Procedure Laterality Date  COLONOSCOPY  04/26/2007  HERNIA REPAIR    inguinal hernia x3  IR GASTROSTOMY TUBE MOD SED  08/22/2019  IR IMAGING GUIDED PORT INSERTION  03/29/2019  JOINT REPLACEMENT  2002 & 2007  bilateral hip replacement  LYMPH NODE BIOPSY Right 03/20/2019  Procedure: DEEP RIGHT SUPRACLAVICULAR LYMPH NODE EXCISION;  Surgeon: Fanny Skates, MD;  Location: [facility];  Service: General;  Laterality: Right;  MULTIPLE EXTRACTIONS WITH ALVEOLOPLASTY  07/28/2011  Procedure: MULTIPLE EXTRACION WITH ALVEOLOPLASTY;  Surgeon: Lenn Cal, DDS;  Location: [facility];  Service: Oral Surgery;  Laterality: N/A;  Extraction of tooth #'s 2,3,4,5,6,11,12,13,15,19,22 with alveoloplasty.  shoulder repair - right for separation of shoulder    TEE WITHOUT CARDIOVERSION  07/29/2011  Procedure: TRANSESOPHAGEAL ECHOCARDIOGRAM (TEE);  Surgeon: Josue Hector, MD;  Location: [facility];  Service: Cardiovascular;  Laterality: N/A;  TOTAL HIP REVISION Right 09/05/2012  Procedure: RIGHT HIP RESECTION ARTHROPLASTY WITH ANTIBIOTIC SPACERS;  Surgeon: [name], MD;  Location: [facility];  Service: Orthopedics;  Laterality: Right;  TOTAL HIP REVISION Right 11/30/2012  Procedure: RIGHT TOTAL HIP ARTHROPLASTY REIMPLANTATION;  Surgeon: [name], MD;  Location: [facility];  Service: Orthopedics;  Laterality: Right;  HPI: Patient is a 65 year old Caucasian  male with medical history significant of chronic tracheostomy after prolonged hospitalization in 2021 related with pneumonia due to 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  Patient was discharged from this hospital on 08/25/2020.  Patient was said to have done very well for the first 3 weeks.  Over the last 5 days, patient has been failing to thrive, with associated poor appetite and malaise.  Readmitted with unchanged multifocal PNA.  Subjective: pt alert, eager to go home Assessment / Plan / Recommendation CHL IP CLINICAL IMPRESSIONS 09/20/2020 Clinical Impression Patient presents with excellent oropharyngeal swallowing function. Oral phase timely and with full clearance of bolus. Pharyngeally, strength has improved from previous MBS 03/2020 in that patient has no pharyngeal residue post swallow. Intermittent trace penetration of thin liquids noted, primarily via straw, due to mildly decreased laryngeal closure noted, remaining above the vocal cords and clearing with either subsequent dry swallow or cued throat clear. Tested all consistencies both with and without PMSV in place with no notable difference in swallowing function. Educated patient and spouse (via phone per patient request) on results of testing and recommendations which include use of PMV if able (not necessary) to faciliate improved strength of cough/throat clear if needed. Both verbalized understanding. SLP will f/u briefly for tolerance and continued education as needed. Do not suspect that dysphagia is resulting in recurrent PNAs. Cannot however r/o contribution of PEG tube feedings on aspiration, particularly because patient is also taking pos by mouth in conjunction with tube feeds, and complains of feeling full often. Recommend dietary consult to ensure that patient is being fed via tube the  correct amount and in the correct manner based on how much he consuming by mouth. SLP Visit Diagnosis Dysphagia, pharyngeal phase (R13.13) Attention and concentration deficit following -- Frontal lobe and executive function deficit following -- Impact on safety and function Mild aspiration risk   CHL IP TREATMENT RECOMMENDATION 09/20/2020 Treatment Recommendations Therapy as  outlined in treatment plan below   Prognosis 08/23/2020 Prognosis for Safe Diet Advancement Good Barriers to Reach Goals -- Barriers/Prognosis Comment -- CHL IP DIET RECOMMENDATION 09/20/2020 SLP Diet Recommendations Regular solids;Thin liquid Liquid Administration via Cup;Straw Medication Administration Whole meds with liquid Compensations Slow rate;Small sips/bites Postural Changes Remain semi-upright after after feeds/meals (Comment);Seated upright at 90 degrees   CHL IP OTHER RECOMMENDATIONS 09/20/2020 Recommended Consults Other (Comment) Oral Care Recommendations Oral care BID Other Recommendations --   CHL IP FOLLOW UP RECOMMENDATIONS 09/20/2020 Follow up Recommendations None   CHL IP FREQUENCY AND DURATION 09/20/2020 Speech Therapy Frequency (ACUTE ONLY) min 1 x/week Treatment Duration 1 week      CHL IP ORAL PHASE 09/20/2020 Oral Phase WFL Oral - Pudding Teaspoon -- Oral - Pudding Cup -- Oral - Honey Teaspoon -- Oral - Honey Cup -- Oral - Nectar Teaspoon -- Oral - Nectar Cup -- Oral - Nectar Straw -- Oral - Thin Teaspoon -- Oral - Thin Cup -- Oral - Thin Straw -- Oral - Puree -- Oral - Mech Soft -- Oral - Regular -- Oral - Multi-Consistency -- Oral - Pill -- Oral Phase - Comment --  CHL IP PHARYNGEAL PHASE 09/20/2020 Pharyngeal Phase Impaired Pharyngeal- Pudding Teaspoon -- Pharyngeal -- Pharyngeal- Pudding Cup -- Pharyngeal -- Pharyngeal- Honey Teaspoon -- Pharyngeal -- Pharyngeal- Honey Cup -- Pharyngeal -- Pharyngeal- Nectar Teaspoon -- Pharyngeal -- Pharyngeal- Nectar Cup -- Pharyngeal -- Pharyngeal- Nectar Straw -- Pharyngeal -- Pharyngeal-  Thin Teaspoon NT Pharyngeal -- Pharyngeal- Thin Cup WFL Pharyngeal -- Pharyngeal- Thin Straw Reduced laryngeal elevation;Penetration/Aspiration during swallow Pharyngeal Material enters airway, remains ABOVE vocal cords and not ejected out Pharyngeal- Puree WFL Pharyngeal -- Pharyngeal- Mechanical Soft WFL Pharyngeal -- Pharyngeal- Regular -- Pharyngeal -- Pharyngeal- Multi-consistency -- Pharyngeal -- Pharyngeal- Pill WFL Pharyngeal -- Pharyngeal Comment --  CHL IP CERVICAL ESOPHAGEAL PHASE 09/20/2020 Cervical Esophageal Phase WFL Pudding Teaspoon -- Pudding Cup -- Honey Teaspoon -- Honey Cup -- Nectar Teaspoon -- Nectar Cup -- Nectar Straw -- Thin Teaspoon -- Thin Cup -- Thin Straw -- Puree -- Mechanical Soft -- Regular -- Multi-consistency -- Pill -- Cervical Esophageal Comment -- McCoy Leah Meryl 09/20/2020, 9:32 AM              ECHOCARDIOGRAM COMPLETE  Result Date: 09/21/2020    ECHOCARDIOGRAM REPORT   Patient Name:   KAPENA HAMME [facility] Date of Exam: 09/21/2020 Medical Rec #:  [id number]         Height:       65.0 in Accession #:    [id number]        Weight:       139.6 lb Date of Birth:  [birth year]          BSA:          1.698 m Patient Age:    58 years          BP:           103/66 mmHg Patient Gender: M                 HR:           95 bpm. Exam Location:  Inpatient Procedure: 2D Echo, 3D Echo, Cardiac Doppler and Color Doppler Indications:    Bacteremia  History:        Patient has prior history of Echocardiogram examinations, most                 recent 07/09/2019. Abnormal ECG,  Arrythmias:Tachycardia,                 Signs/Symptoms:Bacteremia, Shortness of Breath and Dyspnea; Risk                 Factors:Sleep Apnea, Hypertension and Diabetes. Cancer. Right                 pneumothorax. Subcutaneous air. Hypoxia.  Sonographer:    Roseanna Rainbow RDCS Referring Phys: [place] Comments: Technically difficult study due to poor echo windows, suboptimal parasternal window, suboptimal apical  window and suboptimal subcostal window. Patient has trach collar. Off aix medial images in apical region. Feeding tube in subcostal region. IMPRESSIONS  1. Left ventricular ejection fraction, by estimation, is 60 to 65%. The left ventricle has normal function. The left ventricle has no regional wall motion abnormalities. There is mild left ventricular hypertrophy. Left ventricular diastolic parameters were normal.  2. Right ventricular systolic function is normal. The right ventricular size is moderately enlarged. There is normal pulmonary artery systolic pressure.  3. The mitral valve is normal in structure. No evidence of mitral valve regurgitation.  4. The aortic valve was not well visualized. Aortic valve regurgitation is not visualized. No aortic stenosis is present.  5. The inferior vena cava is normal in size with greater than 50% respiratory variability, suggesting right atrial pressure of 3 mmHg. Conclusion(s)/Recommendation(s): Techincally difficult study, but no vegetaion seen. If high clinical supicion for endocarditis, consider TEE. FINDINGS  Left Ventricle: Left ventricular ejection fraction, by estimation, is 60 to 65%. The left ventricle has normal function. The left ventricle has no regional wall motion abnormalities. The left ventricular internal cavity size was small. There is mild left ventricular hypertrophy. Left ventricular diastolic parameters were normal. Right Ventricle: The right ventricular size is moderately enlarged. Right vetricular wall thickness was not well visualized. Right ventricular systolic function is normal. There is normal pulmonary artery systolic pressure. The tricuspid regurgitant velocity is 2.78 m/s, and with an assumed right atrial pressure of 3 mmHg, the estimated right ventricular systolic pressure is 86.7 mmHg. Left Atrium: Left atrial size was normal in size. Right Atrium: Right atrial size was normal in size. Pericardium: There is no evidence of pericardial  effusion. Mitral Valve: The mitral valve is normal in structure. No evidence of mitral valve regurgitation. Tricuspid Valve: The tricuspid valve is normal in structure. Tricuspid valve regurgitation is trivial. Aortic Valve: The aortic valve was not well visualized. Aortic valve regurgitation is not visualized. No aortic stenosis is present. Pulmonic Valve: The pulmonic valve was not well visualized. Pulmonic valve regurgitation is not visualized. Aorta: The aortic root is normal in size and structure. Venous: The inferior vena cava is normal in size with greater than 50% respiratory variability, suggesting right atrial pressure of 3 mmHg. IAS/Shunts: The interatrial septum was not well visualized.  LEFT VENTRICLE PLAX 2D LVIDd:         3.60 cm      Diastology LVIDs:         2.30 cm      LV e' medial:    11.20 cm/s LV PW:         1.30 cm      LV E/e' medial:  8.5 LV IVS:        0.90 cm      LV e' lateral:   12.00 cm/s LVOT diam:     2.20 cm      LV E/e' lateral: 7.9 LV  SV:         57 LV SV Index:   34 LVOT Area:     3.80 cm  LV Volumes (MOD) LV vol d, MOD A2C: 66.9 ml LV vol d, MOD A4C: 101.0 ml LV vol s, MOD A2C: 22.7 ml LV vol s, MOD A4C: 24.2 ml LV SV MOD A2C:     44.2 ml LV SV MOD A4C:     101.0 ml LV SV MOD BP:      78.9 ml RIGHT VENTRICLE             IVC RV S prime:     18.20 cm/s  IVC diam: 1.80 cm TAPSE (M-mode): 2.0 cm LEFT ATRIUM           Index       RIGHT ATRIUM           Index LA diam:      3.40 cm 2.00 cm/m  RA Area:     15.10 cm LA Vol (A2C): 13.3 ml 7.83 ml/m  RA Volume:   34.90 ml  20.56 ml/m LA Vol (A4C): 28.1 ml 16.55 ml/m  AORTIC VALVE LVOT Vmax:   126.00 cm/s LVOT Vmean:  71.400 cm/s LVOT VTI:    0.150 m  AORTA Ao Root diam: 3.40 cm Ao Asc diam:  3.70 cm MITRAL VALVE               TRICUSPID VALVE MV Area (PHT): 5.19 cm    TR Peak grad:   30.9 mmHg MV Decel Time: 146 msec    TR Vmax:        278.00 cm/s MV E velocity: 95.14 cm/s MV A velocity: 84.80 cm/s  SHUNTS MV E/A ratio:  1.12         Systemic VTI:  0.15 m                            Systemic Diam: 2.20 cm Oswaldo Milian MD Electronically signed by Oswaldo Milian MD Signature Date/Time: 09/21/2020/6:08:46 PM    Final    VAS [place] LOWER EXTREMITY VENOUS (DVT)  Result Date: 09/25/2020  Lower Venous DVT Study Patient Name:  KIEFER OPHEIM [facility]  Date of Exam:   09/24/2020 Medical Rec #: [id number]          Accession #:    [id number] Date of Birth: 1955/08/27           Patient Gender: M Patient Age:   64Y Exam Location:  [facility] Procedure:      VAS [place] LOWER EXTREMITY VENOUS (DVT) Referring Phys: [id number] MURALI RAMASWAMY --------------------------------------------------------------------------------  Indications: Post-Covid severe pulmonary fibrosis, relapsed Hodkin's lymphoma, edema.  Comparison       07-09-2019 Bilateral lower extremity venous was negative for Study:           DVT. Performing Technologist: Darlin Coco RDMS,RVT  Examination Guidelines: A complete evaluation includes B-mode imaging, spectral Doppler, color Doppler, and power Doppler as needed of all accessible portions of each vessel. Bilateral testing is considered an integral part of a complete examination. Limited examinations for reoccurring indications may be performed as noted. The reflux portion of the exam is performed with the patient in reverse Trendelenburg.  +---------+---------------+---------+-----------+----------+--------------+ RIGHT    CompressibilityPhasicitySpontaneityPropertiesThrombus Aging +---------+---------------+---------+-----------+----------+--------------+ CFV      Full           Yes      Yes                                 +---------+---------------+---------+-----------+----------+--------------+  SFJ      Full                                                        +---------+---------------+---------+-----------+----------+--------------+ FV Prox  Full                                                         +---------+---------------+---------+-----------+----------+--------------+ FV Mid   Full                                                        +---------+---------------+---------+-----------+----------+--------------+ FV DistalFull                                                        +---------+---------------+---------+-----------+----------+--------------+ PFV      Full                                                        +---------+---------------+---------+-----------+----------+--------------+ POP      Full           Yes      Yes                                 +---------+---------------+---------+-----------+----------+--------------+ PTV      Full                                                        +---------+---------------+---------+-----------+----------+--------------+ PERO     Full                                                        +---------+---------------+---------+-----------+----------+--------------+   +---------+---------------+---------+-----------+----------+--------------+ LEFT     CompressibilityPhasicitySpontaneityPropertiesThrombus Aging +---------+---------------+---------+-----------+----------+--------------+ CFV      Full           Yes      Yes                                 +---------+---------------+---------+-----------+----------+--------------+ SFJ      Full                                                        +---------+---------------+---------+-----------+----------+--------------+  FV Prox  Full                                                        +---------+---------------+---------+-----------+----------+--------------+ FV Mid   Full                                                        +---------+---------------+---------+-----------+----------+--------------+ FV DistalFull                                                         +---------+---------------+---------+-----------+----------+--------------+ PFV      Full                                                        +---------+---------------+---------+-----------+----------+--------------+ POP      Full           Yes      Yes                                 +---------+---------------+---------+-----------+----------+--------------+ PTV      Full                                                        +---------+---------------+---------+-----------+----------+--------------+ PERO     Full                                                        +---------+---------------+---------+-----------+----------+--------------+     Summary: RIGHT: - There is no evidence of deep vein thrombosis in the lower extremity.  - No cystic structure found in the popliteal fossa.  - Ultrasound characteristics of enlarged lymph nodes are noted in the groin.  LEFT: - There is no evidence of deep vein thrombosis in the lower extremity.  - No cystic structure found in the popliteal fossa.  *See table(s) above for measurements and observations. Electronically signed by Harold Barban MD on 09/25/2020 at 10:25:35 PM.    Final    [place] EKG SITE RITE  Result Date: 10/06/2020 If Site Rite image not attached, placement could not be confirmed due to current cardiac rhythm.  US Abdomen Limited RUQ (LIVER/GB)  Result Date: 09/19/2020 CLINICAL DATA:  Transaminitis. EXAM: ULTRASOUND ABDOMEN LIMITED RIGHT UPPER QUADRANT COMPARISON:  None. FINDINGS: Gallbladder: No gallbladder wall thickening. Small amount of biliary sludge. No sonographic Murphy sign noted by sonographer. Common bile duct: Diameter: 3 mm, normal Liver: No focal lesion identified. Within normal limits in  parenchymal echogenicity. Portal vein is patent on color Doppler imaging with normal direction of blood flow towards the liver. Other: None. IMPRESSION: Biliary sludge without evidence of acute cholecystitis. Electronically  Signed   By: Valentino Saxon MD   On: 09/19/2020 16:18    Lab Data:  CBC: Recent Labs  Lab 10/04/20 0247 10/06/20 0237  WBC 15.9* 13.1*  HGB 10.5* 11.2*  HCT 36.2* 39.1  MCV 99.5 100.0  PLT 248 440   Basic Metabolic Panel: Recent Labs  Lab 10/06/20 0237 10/07/20 0235 10/08/20 0258 10/09/20 0500 10/10/20 0400  NA 142  144 141 141 140 142  K 4.7  4.8 4.6 4.4 4.3 4.1  CL 92*  91* 89* 89* 88* 90*  CO2 45*  45* 46* 46* >50* 45*  GLUCOSE 86  85 93 99 134* 138*  BUN 27*  27* 25* 28* 23 32*  CREATININE 0.52*  0.59* 0.47* 0.49* 0.43* 0.64  CALCIUM 10.5*  10.5* 10.1 10.1 10.2 10.7*  MG 2.4  --   --   --   --   PHOS 4.2  4.2 3.6 3.4 3.7 4.8*   GFR: Estimated Creatinine Clearance: 81.1 mL/min (by C-G formula based on SCr of 0.64 mg/dL). Liver Function Tests: Recent Labs  Lab 10/06/20 0237 10/07/20 0235 10/08/20 0258 10/09/20 0500 10/10/20 0400  ALBUMIN 2.6* 2.7* 2.7* 2.8* 2.8*   No results for input(s): LIPASE, AMYLASE in the last 168 hours. No results for input(s): AMMONIA in the last 168 hours. Coagulation Profile: Recent Labs  Lab 10/04/20 1005  INR 1.0   Cardiac Enzymes: No results for input(s): CKTOTAL, CKMB, CKMBINDEX, TROPONINI in the last 168 hours. BNP (last 3 results) No results for input(s): PROBNP in the last 8760 hours. HbA1C: No results for input(s): HGBA1C in the last 72 hours. CBG: Recent Labs  Lab 10/09/20 1955 10/09/20 2328 10/10/20 0331 10/10/20 0743 10/10/20 1147  GLUCAP 126* 111* 135* 121* 138*   Lipid Profile: No results for input(s): CHOL, HDL, LDLCALC, TRIG, CHOLHDL, LDLDIRECT in the last 72 hours. Thyroid Function Tests: No results for input(s): TSH, T4TOTAL, FREET4, T3FREE, THYROIDAB in the last 72 hours. Anemia Panel: No results for input(s): VITAMINB12, FOLATE, FERRITIN, TIBC, IRON, RETICCTPCT in the last 72 hours. Urine analysis:    Component Value Date/Time   COLORURINE YELLOW 09/19/2020 1511   APPEARANCEUR  HAZY (A) 09/19/2020 1511   LABSPEC 1.012 09/19/2020 1511   PHURINE 7.0 09/19/2020 1511   GLUCOSEU NEGATIVE 09/19/2020 1511   HGBUR NEGATIVE 09/19/2020 1511   HGBUR negative 03/23/2007 0000   BILIRUBINUR NEGATIVE 09/19/2020 1511   BILIRUBINUR neg 11/11/2016 1017   KETONESUR NEGATIVE 09/19/2020 1511   PROTEINUR NEGATIVE 09/19/2020 1511   UROBILINOGEN 0.2 11/11/2016 1017   UROBILINOGEN 0.2 11/08/2012 0915   NITRITE NEGATIVE 09/19/2020 1511   LEUKOCYTESUR NEGATIVE 09/19/2020 1511     Adya Wirz M.D. [facility] 10/10/2020, 12:41 PM  Available via Epic secure chat 7am-7pm After 7 pm, please refer to night coverage provider listed on amion.

## 2020-10-10 NOTE — Progress Notes (Signed)
This RT placed patient back on vent due to some increased work of breathing. Patient was in agreement. RT will continue to monitor.

## 2020-10-11 DIAGNOSIS — I10 Essential (primary) hypertension: Secondary | ICD-10-CM

## 2020-10-11 DIAGNOSIS — J9611 Chronic respiratory failure with hypoxia: Secondary | ICD-10-CM | POA: Diagnosis not present

## 2020-10-11 DIAGNOSIS — G620 Drug-induced polyneuropathy: Secondary | ICD-10-CM | POA: Diagnosis not present

## 2020-10-11 DIAGNOSIS — R627 Adult failure to thrive: Secondary | ICD-10-CM | POA: Diagnosis not present

## 2020-10-11 DIAGNOSIS — R7881 Bacteremia: Secondary | ICD-10-CM | POA: Diagnosis not present

## 2020-10-11 LAB — CBC
HCT: 34.4 % — ABNORMAL LOW (ref 39.0–52.0)
Hemoglobin: 9.9 g/dL — ABNORMAL LOW (ref 13.0–17.0)
MCH: 29.1 pg (ref 26.0–34.0)
MCHC: 28.8 g/dL — ABNORMAL LOW (ref 30.0–36.0)
MCV: 101.2 fL — ABNORMAL HIGH (ref 80.0–100.0)
Platelets: 125 10*3/uL — ABNORMAL LOW (ref 150–400)
RBC: 3.4 MIL/uL — ABNORMAL LOW (ref 4.22–5.81)
RDW: 16 % — ABNORMAL HIGH (ref 11.5–15.5)
WBC: 7.3 10*3/uL (ref 4.0–10.5)
nRBC: 0 % (ref 0.0–0.2)

## 2020-10-11 LAB — RENAL FUNCTION PANEL
Albumin: 2.8 g/dL — ABNORMAL LOW (ref 3.5–5.0)
Anion gap: 6 (ref 5–15)
BUN: 36 mg/dL — ABNORMAL HIGH (ref 8–23)
CO2: 46 mmol/L — ABNORMAL HIGH (ref 22–32)
Calcium: 10.8 mg/dL — ABNORMAL HIGH (ref 8.9–10.3)
Chloride: 93 mmol/L — ABNORMAL LOW (ref 98–111)
Creatinine, Ser: 0.58 mg/dL — ABNORMAL LOW (ref 0.61–1.24)
GFR, Estimated: >60 mL/min (ref >60)
Glucose, Bld: 135 mg/dL — ABNORMAL HIGH (ref 70–99)
Phosphorus: 4.2 mg/dL (ref 2.5–4.6)
Potassium: 4.3 mmol/L (ref 3.5–5.1)
Sodium: 145 mmol/L (ref 135–145)

## 2020-10-11 LAB — GLUCOSE, CAPILLARY
Glucose-Capillary: 124 mg/dL — ABNORMAL HIGH (ref 70–99)
Glucose-Capillary: 140 mg/dL — ABNORMAL HIGH (ref 70–99)
Glucose-Capillary: 134 mg/dL — ABNORMAL HIGH (ref 70–99)
Glucose-Capillary: 172 mg/dL — ABNORMAL HIGH (ref 70–99)
Glucose-Capillary: 96 mg/dL (ref 70–99)
Glucose-Capillary: 140 mg/dL — ABNORMAL HIGH (ref 70–99)

## 2020-10-11 MED ORDER — SERTRALINE HCL 100 MG PO TABS
100.0000 mg | ORAL_TABLET | Freq: Every day | ORAL | Status: DC
  Administered 2020-10-12 – 2020-10-15 (×4): 100 mg
  Filled 2020-10-11 (×4): qty 1

## 2020-10-11 MED ORDER — CHLORHEXIDINE GLUCONATE 0.12 % MT SOLN
OROMUCOSAL | Status: AC
  Administered 2020-10-11: 15 mL via OROMUCOSAL
  Filled 2020-10-11: qty 15

## 2020-10-11 NOTE — Progress Notes (Signed)
[facility]                                                                              Patient Demographics  Clare Casto, is a 65 y.o. 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  First negative blood cultures 09/22/20.  0n 09/24/20, patient developed respiratory distress with increased oxygen requirement and was transferred to ICU and started on pressure support ventilation.  Critical care and ID consulted.   ID recommended TEE to rule out infective endocarditis, however not stable to have TEE at this time per cardiology.    ID recommended 6 weeks of IV antibiotics from first negative blood cultures on 09/22/20, if unable to have TEE.  Cardiology was reconsulted to reassess if patient is stable for TEE on 6/28 however still having descending episodes and unstable for TEE..     Assessment & Plan    Principal Problem: Sepsis due to pneumonia, staph epidermidis bacteremia: Present on admission -Met sepsis criteria on presentation with tachycardia, tachypnea, leukocytosis, chest x-ray with possible recurrent pneumonia -CT chest with interval increase in bulk of mediastinal and axillary adenopathy. -Blood cultures grew staph  epidermidis, Port-A-Cath was removed on 6/16. -Patient remained on cefepime from 6/11-6/15.  ID recommended TEE to exclude endocarditis, however patient has been unstable for TEE -Continue midodrine for hypotension, stress dose steroids tapered off on 6/24 -ID recommended vancomycin for minimum 6 weeks from date of negative culture on 6/14, will need 4 more weeks upon discharge (if TEE not done). 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 x1 on 7/1 for bicarb > 50 -Bicarb 46, stable, monitor closely   Relapsed Hodgkin lymphoma -CT chest concerning for worsening mediastinal and axillary lymphadenopathy -Oncology and palliative medicine following -Seen by Dr. Marin Olp on 6/20, PICC line has been placed  Diabetes mellitus type 2, NIDDM with hyperglycemia, uncontrolled -CBG stable, continue  Levemir 10 units qhs, sensitive sliding scale insulin   Normocytic anemia, anemia of chronic disease -Hemoglobin stable, 9.9  Hyponatremia, hypokalemia -Resolved  Pressure injury -Sacrum mid stage I -Wound care per RN  Severe malnutrition secondary to chronic illness, malignancy -Continue tube feeds -  Estimated body mass index is 25.31 kg/m as calculated from the following:   Height as of this encounter: 5' 5"  (1.651 m).   Weight as of this encounter: 69 kg.  History of depression, anxiety Increase to Zoloft 137m daily   Code  Status: full  DVT Prophylaxis:  enoxaparin (LOVENOX) injection 40 mg Start: 09/19/20 2200   Level of Care: Level of care: ICU Family Communication: Discussed all imaging results, lab results, explained patient's wife on phone [phone]  Disposition Plan:     Status is: Inpatient  Remains inpatient appropriate because:Inpatient level of care appropriate due to severity of illness  Dispo: The patient is from: Home              Anticipated d/c is to: Home              Patient currently is not medically stable to d/c.  Once respiratory status is close to his baseline   Difficult to place patient No  Time Spent in minutes  25 mins   Procedures:  Mech vent   Consultants:   Pulmonary critical care Oncology ID Palliative medicine    Antimicrobials:   Anti-infectives (From admission, onward)    Start     Dose/Rate Route Frequency Ordered Stop   09/23/20 0800  ceFEPIme (MAXIPIME) 2 g in sodium chloride 0.9 % 100 mL IVPB  Status:  Discontinued        2 g 200 mL/hr over 30 Minutes Intravenous Every 8 hours 09/23/20 0738 09/23/20 1758   09/21/20 0400  vancomycin (VANCOREADY) IVPB 750 mg/150 mL        750 mg 150 mL/hr over 60 Minutes Intravenous Every 12 hours 09/20/20 1715 11/04/20 0559   09/20/20 1730  vancomycin (VANCOREADY) IVPB 750 mg/150 mL        750 mg 150 mL/hr over 60 Minutes Intravenous STAT 09/20/20 1714 09/20/20 1954   09/20/20 0400  vancomycin (VANCOREADY) IVPB 750 mg/150 mL  Status:  Discontinued        750 mg 150 mL/hr over 60 Minutes Intravenous Every 12 hours 09/19/20 1932 09/20/20 1155   09/20/20 0000  ceFEPIme (MAXIPIME) 2 g in sodium chloride 0.9 % 100 mL IVPB  Status:  Discontinued        2 g 200 mL/hr over 30 Minutes Intravenous Every 8 hours 09/19/20 1932 09/23/20 0721   09/19/20 1600  ceFEPIme (MAXIPIME) 2 g in sodium chloride 0.9 % 100 mL IVPB        2 g 200 mL/hr over 30 Minutes Intravenous  Once 09/19/20 1548 09/19/20 1634   09/19/20 1600   vancomycin (VANCOREADY) IVPB 1250 mg/250 mL        1,250 mg 166.7 mL/hr over 90 Minutes Intravenous STAT 09/19/20 1557 09/19/20 1822          Medications  Scheduled Meds:  chlorhexidine gluconate (MEDLINE KIT)  15 mL Mouth Rinse BID   Chlorhexidine Gluconate Cloth  6 each Topical Q0600   cholecalciferol  10,000 Units Oral Daily   enoxaparin (LOVENOX) injection  40 mg Subcutaneous Q24H   free water  100 mL Per Tube Q6H   insulin aspart  0-9 Units Subcutaneous Q4H   insulin glargine  10  Units Subcutaneous QHS   mouth rinse  15 mL Mouth Rinse BID   midodrine  10 mg Per Tube TID WC   multivitamin  15 mL Per Tube Daily   pantoprazole sodium  40 mg Per Tube Daily   sertraline  50 mg Per Tube Daily   sodium chloride flush  10-40 mL Intracatheter Q12H   traZODone  100 mg Per Tube QHS   Continuous Infusions:  sodium chloride 20 mL/hr at 10/04/20 1343   feeding supplement (VITAL AF 1.2 CAL) 1,000 mL (10/11/20 0440)   vancomycin 750 mg (10/11/20 0526)   PRN Meds:.albuterol, guaiFENesin, hyoscyamine, LORazepam, sodium chloride flush      Subjective:   Lyrick Lagrand was seen and examined today.  Overnight needed to place on mechanical vent, was somewhat short of breath.  At the time of my examination this morning, was on vent.  Feeling rested.  No fevers.  Tolerating tube feeds   Objective:   Vitals:   10/11/20 0843 10/11/20 1000 10/11/20 1111 10/11/20 1200  BP:  (!) 108/57 (!) 101/57 105/65  Pulse: 79 67 64 78  Resp: 18  17   Temp:    98.4 F (36.9 C)  TempSrc:    Oral  SpO2: 94% 99% 95% 100%  Weight:      Height:        Intake/Output Summary (Last 24 hours) at 10/11/2020 1255 Last data filed at 10/11/2020 1216 Gross per 24 hour  Intake 1125.66 ml  Output 2175 ml  Net -1049.34 ml     Wt Readings from Last 3 Encounters:  09/30/20 69 kg  09/18/20 64.4 kg  09/08/20 64.4 kg   Physical Exam General: Alert and oriented, on vent Cardiovascular: S1 S2 clear, RRR.  No pedal edema b/l Respiratory: Fairly CTA B anteriorly Gastrointestinal: Soft, nontender, nondistended, NBS Ext: no pedal edema bilaterally Neuro: no new deficits Psych: appears to be somewhat depressed, flat affect   Data Reviewed:  I have personally reviewed following labs and imaging studies  Micro Results No results found for this or any previous visit (from the past 240 hour(s)).  Radiology Reports DG Chest 2 View  Result Date: 09/18/2020 CLINICAL DATA:  Acute on chronic respiratory failure. EXAM: CHEST - 2 VIEW COMPARISON:  Aug 15, 2020. FINDINGS: Stable cardiomegaly. Tracheostomy tube is in good position. Left-sided Port-A-Cath is unchanged. Stable bilateral lung opacities are noted concerning for multifocal pneumonia or chronic scarring. No pneumothorax or pleural effusion is noted. Bony thorax is unremarkable. IMPRESSION: Stable bilateral lung opacities as described above. Electronically Signed   By: Marijo Conception M.D.   On: 09/18/2020 12:59   CT CHEST WO CONTRAST  Result Date: 09/20/2020 CLINICAL DATA:  Respiratory failure.  History of Hodgkin's lymphoma. EXAM: CT CHEST WITHOUT CONTRAST TECHNIQUE: Multidetector CT imaging of the chest was performed following the standard protocol without IV contrast. COMPARISON:  July 21, 2020 FINDINGS: Cardiovascular: Heart is mildly enlarged. LEFT chest port tip terminates at the superior cavoatrial junction. No pericardial effusion. Minimal atherosclerotic calcifications. Enlarged pulmonary artery as can be seen in pulmonary arterial hypertension. Mediastinum/Nodes: Revisualization of extensive adenopathy. LEFT level 3 axillary lymph node measures 11 mm, previously 3 mm (series 2, image 42). LEFT supraclavicular lymph node measures 15 mm, previously 15 mm (series 2, image 24). RIGHT level 1 axillary lymph node measures 8 mm, previously 4 mm (series 2, image 63). RIGHT internal mammary lymph node measures 31 mm, previously 26 mm (series 2, image  77). RIGHT paratracheal  conglomeration spanned 59 x 43 mm, previously 44 by 30 mm (series 2, image 45). There is a lunate configuration of the trachea. Lungs/Pleura: Visualization of a peripheral rind of fluid adjacent to the RIGHT lung, similar in comparison to prior. Extensive bilateral ground-glass opacities with subpleural reticulation and suggestion of honeycombing. Peribronchovascular nodularity is similar in comparison to prior. Similar appearance of RIGHT middle lobe atelectasis. Upper Abdomen: No acute abnormality. Musculoskeletal: Known osseous metastatic disease is not well visualized. Limited evaluation of the ribs secondary to respiratory motion. No acute thoracic spine fracture. IMPRESSION: 1. Interval increase in bulky adenopathy within the mediastinum consistent with known lymphoma. Increased axillary lymph node size in comparison to prior. 2. Revisualization of sequela of advanced pulmonary fibrosis. Unchanged appearance of peribronchovascular nodularity. This could reflect lymphangitic spread of lymphoma versus underlying infection. 3. Unchanged small rind of RIGHT-sided pleural fluid. Underlying pleural nodularity from lymphoma remains in the differential. 4. 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 5. Enlarged pulmonary artery as can be seen in pulmonary arterial hypertension. Electronically Signed   By: Valentino Saxon MD   On: 09/20/2020 17:25   CT Angio Chest Pulmonary Embolism (PE) W or WO Contrast  Result Date: 09/25/2020 CLINICAL DATA:  Cough and hypoxia.  COVID-19. EXAM: CT ANGIOGRAPHY CHEST WITH CONTRAST TECHNIQUE: Multidetector CT imaging of the chest was performed using the standard protocol during bolus administration of intravenous contrast. Multiplanar CT image reconstructions and MIPs were obtained to evaluate the vascular anatomy. CONTRAST:  103m OMNIPAQUE IOHEXOL 350 MG/ML SOLN COMPARISON:  09/20/2020 FINDINGS: Cardiovascular:  Contrast injection is sufficient to demonstrate satisfactory opacification of the pulmonary arteries to the segmental level. There is no pulmonary embolus or evidence of right heart strain. The size of the main pulmonary artery is normal. Heart size is normal, with no pericardial effusion. The course and caliber of the aorta are normal. There is no atherosclerotic calcification. No acute aortic syndrome. Mediastinum/Nodes: Bulky mediastinal adenopathy is unchanged since the recent study of 09/20/2020. Lungs/Pleura: Tracheostomy tube tip is at the level of the clavicular heads. There is increased pleural fluid on the right. Diffuse pleural thickening is unchanged. Unchanged appearance of diffuse parenchymal interstitial abnormality. Upper Abdomen: Contrast bolus timing is not optimized for evaluation of the abdominal organs. The visualized portions of the organs of the upper abdomen are normal. Musculoskeletal: No chest wall abnormality. No bony spinal canal stenosis. Review of the MIP images confirms the above findings. IMPRESSION: 1. No pulmonary embolus or acute aortic syndrome. 2. Bulky mediastinal adenopathy and diffuse pleural, interstitial and parenchymal pulmonary abnormalities are unchanged since 09/20/2020. Electronically Signed   By: KUlyses JarredM.D.   On: 09/25/2020 02:54   IR REMOVAL TUN ACCESS W/ PORT W/O FL MOD SED  Result Date: 09/22/2020 INDICATION: 65year old male with a history of Hodgkin lymphoma currently presenting with bacteremia associated with pneumonia. He has a left chest port catheter which was placed by interventional radiology (Dr. SAnnamaria Boots in December of 2020. he presents for port catheter removal to help facilitate clearance of bacteremia. EXAM: REMOVAL RIGHT IJ VEIN PORT-A-CATH MEDICATIONS: None ANESTHESIA/SEDATION: Moderate (conscious) sedation was employed during this procedure. A total of Versed 1 mg and Fentanyl 50 mcg was administered intravenously. Moderate Sedation Time:  12 minutes. The patient's level of consciousness and vital signs were monitored continuously by radiology nursing throughout the procedure under my direct supervision. FLUOROSCOPY TIME:  None. COMPLICATIONS: None immediate. PROCEDURE: Informed written consent was obtained from the patient after a thorough  discussion of the procedural risks, benefits and alternatives. All questions were addressed. Maximal Sterile Barrier Technique was utilized including caps, mask, sterile gowns, sterile gloves, sterile drape, hand hygiene and skin antiseptic. A timeout was performed prior to the initiation of the procedure. The right chest was prepped and draped in a sterile fashion. Lidocaine was utilized for local anesthesia. An incision was made over the previously healed surgical incision. Utilizing blunt dissection, the port catheter and reservoir were removed from the underlying subcutaneous tissue in their entirety. Securing sutures were also removed. The pocket was irrigated with a copious amount of sterile normal saline. The wound was examined. No evidence of purulence. The pocket was closed with interrupted 3-0 Vicryl stitches. The subcutaneous tissue was closed with 3-0 Vicryl interrupted subcutaneous stitches. Dermabond was applied. IMPRESSION: Successful right IJ vein Port-A-Cath explant. Electronically Signed   By: Jacqulynn Cadet M.D.   On: 09/22/2020 16:18   DG CHEST PORT 1 VIEW  Result Date: 09/24/2020 CLINICAL DATA:  Respiratory distress EXAM: PORTABLE CHEST 1 VIEW COMPARISON:  09/19/2020 FINDINGS: Lung volumes are small with elevation of the right hemidiaphragm, stable since prior examination. Superimposed multifocal pulmonary infiltrates likely related to chronic underlying interstitial lung disease appears stable, better assessed on CT examination of 09/20/2020. Superimposed right pleural thickening is again noted. Tracheostomy unchanged. Cardiac size is enlarged with associated superior mediastinal  widening again noted related to extensive mediastinal and pericardial adenopathy better demonstrated on prior CT examination. No acute bone abnormality. IMPRESSION: Stable diffuse pulmonary infiltrates likely related to chronic underlying interstitial lung disease and asymmetric right pleural thickening. Stable enlargement of the cardiomediastinal silhouette related to extensive mediastinal and pericardial adenopathy better appreciated on prior CT examination. Electronically Signed   By: Fidela Salisbury MD   On: 09/24/2020 11:41   DG Chest Port 1 View  Result Date: 09/19/2020 CLINICAL DATA:  Shortness of breath. EXAM: PORTABLE CHEST 1 VIEW COMPARISON:  Chest x-ray dated September 18, 2020. FINDINGS: Unchanged tracheostomy tube and left chest wall port catheter. Stable cardiomegaly and mediastinal widening related to underlying lymphadenopathy. Unchanged diffuse patchy nodular opacities throughout both lungs superimposed on chronic pulmonary fibrosis. Unchanged small right pleural effusion. No pneumothorax. No acute osseous abnormality. IMPRESSION: 1. Unchanged multifocal pneumonia superimposed on chronic severe pulmonary fibrosis. 2. Unchanged mediastinal lymphadenopathy. Electronically Signed   By: Titus Dubin M.D.   On: 09/19/2020 15:39   DG Swallowing Func-Speech Pathology  Result Date: 09/20/2020 Formatting of this result is different from the original. Objective Swallowing Evaluation: Type of Study: MBS-Modified Barium Swallow Study  Patient Details Name: VALERIO PINARD MRN: [id number] Date of Birth: 06/27/1955 Today's Date: 09/20/2020 Time: SLP Start Time (ACUTE ONLY): 0903 -SLP Stop Time (ACUTE ONLY): 8413 SLP Time Calculation (min) (ACUTE ONLY): 20 min Past Medical History: Past Medical History: Diagnosis Date  Abscess of muscle 08/10/2011  staph infection of right hip   Acute on chronic respiratory failure with hypoxia ([name])   Acute respiratory distress syndrome (ARDS) due to COVID-19 virus ([place])    Anxiety   Diabetes mellitus without complication ([name])   GERD (gastroesophageal reflux disease)   rare reflux - no meds for reflux - NO PROBLEM IN PAST SEVERAL YRS  HCAP (healthcare-associated pneumonia)   Hip dysplasia, congenital   no surgery as a child for hip dysplasia - has had bilateral hip replacements as an adult  Hodgkin lymphoma ([place])   Hypertension   Pancreatitis   Pneumothorax, acute   Postoperative anemia due to acute blood loss 09/07/2012  Septic arthritis of hip ([place]) 09/05/2012  PT'S TOTAL HIP JOINT REMOVED - ANTIBIOTIC SPACE PLACED AND PT HAS FINISHED IV ANTIBIOTICS ( PICC LINE REMOVED)  Sleep apnea   USES CPAP Past Surgical History: Past Surgical History: Procedure Laterality Date  COLONOSCOPY  04/26/2007  HERNIA REPAIR    inguinal hernia x3  IR GASTROSTOMY TUBE MOD SED  08/22/2019  IR IMAGING GUIDED PORT INSERTION  03/29/2019  JOINT REPLACEMENT  2002 & 2007  bilateral hip replacement  LYMPH NODE BIOPSY Right 03/20/2019  Procedure: DEEP RIGHT SUPRACLAVICULAR LYMPH NODE EXCISION;  Surgeon: Fanny Skates, MD;  Location: [facility];  Service: General;  Laterality: Right;  MULTIPLE EXTRACTIONS WITH ALVEOLOPLASTY  07/28/2011  Procedure: MULTIPLE EXTRACION WITH ALVEOLOPLASTY;  Surgeon: Lenn Cal, DDS;  Location: [facility];  Service: Oral Surgery;  Laterality: N/A;  Extraction of tooth #'s 2,3,4,5,6,11,12,13,15,19,22 with alveoloplasty.  shoulder repair - right for separation of shoulder    TEE WITHOUT CARDIOVERSION  07/29/2011  Procedure: TRANSESOPHAGEAL ECHOCARDIOGRAM (TEE);  Surgeon: Josue Hector, MD;  Location: [facility];  Service: Cardiovascular;  Laterality: N/A;  TOTAL HIP REVISION Right 09/05/2012  Procedure: RIGHT HIP RESECTION ARTHROPLASTY WITH ANTIBIOTIC SPACERS;  Surgeon: [name], MD;  Location: [facility];  Service: Orthopedics;  Laterality: Right;  TOTAL HIP REVISION Right 11/30/2012  Procedure: RIGHT TOTAL HIP ARTHROPLASTY REIMPLANTATION;  Surgeon: [name], MD;   Location: [facility];  Service: Orthopedics;  Laterality: Right; HPI: Patient is a 65 year old Caucasian  male with medical history significant of chronic tracheostomy after prolonged hospitalization in 2021 related with pneumonia due to MCNOB-09 complicated by ARDS and pulmonary fibrosis +/-pneumonitis from brentuximab, s/p PEG, HTN, DM type II, Hodgkin's lymphoma on chemotherapy and GERD.  Patient was discharged from this hospital on 08/25/2020.  Patient was said to have done very well for the first 3 weeks.  Over the last 5 days, patient has been failing to thrive, with associated poor appetite and malaise.  Readmitted with unchanged multifocal PNA.  Subjective: pt alert, eager to go home Assessment / Plan / Recommendation CHL IP CLINICAL IMPRESSIONS 09/20/2020 Clinical Impression Patient presents with excellent oropharyngeal swallowing function. Oral phase timely and with full clearance of bolus. Pharyngeally, strength has improved from previous MBS 03/2020 in that patient has no pharyngeal residue post swallow. Intermittent trace penetration of thin liquids noted, primarily via straw, due to mildly decreased laryngeal closure noted, remaining above the vocal cords and clearing with either subsequent dry swallow or cued throat clear. Tested all consistencies both with and without PMSV in place with no notable difference in swallowing function. Educated patient and spouse (via phone per patient request) on results of testing and recommendations which include use of PMV if able (not necessary) to faciliate improved strength of cough/throat clear if needed. Both verbalized understanding. SLP will f/u briefly for tolerance and continued education as needed. Do not suspect that dysphagia is resulting in recurrent PNAs. Cannot however r/o contribution of PEG tube feedings on aspiration, particularly because patient is also taking pos by mouth in conjunction with tube feeds, and complains of feeling full often. Recommend  dietary consult to ensure that patient is being fed via tube the correct amount and in the correct manner based on how much he consuming by mouth. SLP Visit Diagnosis Dysphagia, pharyngeal phase (R13.13) Attention and concentration deficit following -- Frontal lobe and executive function deficit following -- Impact on safety and function Mild aspiration risk   CHL IP TREATMENT RECOMMENDATION 09/20/2020 Treatment  Recommendations Therapy as outlined in treatment plan below   Prognosis 08/23/2020 Prognosis for Safe Diet Advancement Good Barriers to Reach Goals -- Barriers/Prognosis Comment -- CHL IP DIET RECOMMENDATION 09/20/2020 SLP Diet Recommendations Regular solids;Thin liquid Liquid Administration via Cup;Straw Medication Administration Whole meds with liquid Compensations Slow rate;Small sips/bites Postural Changes Remain semi-upright after after feeds/meals (Comment);Seated upright at 90 degrees   CHL IP OTHER RECOMMENDATIONS 09/20/2020 Recommended Consults Other (Comment) Oral Care Recommendations Oral care BID Other Recommendations --   CHL IP FOLLOW UP RECOMMENDATIONS 09/20/2020 Follow up Recommendations None   CHL IP FREQUENCY AND DURATION 09/20/2020 Speech Therapy Frequency (ACUTE ONLY) min 1 x/week Treatment Duration 1 week      CHL IP ORAL PHASE 09/20/2020 Oral Phase WFL Oral - Pudding Teaspoon -- Oral - Pudding Cup -- Oral - Honey Teaspoon -- Oral - Honey Cup -- Oral - Nectar Teaspoon -- Oral - Nectar Cup -- Oral - Nectar Straw -- Oral - Thin Teaspoon -- Oral - Thin Cup -- Oral - Thin Straw -- Oral - Puree -- Oral - Mech Soft -- Oral - Regular -- Oral - Multi-Consistency -- Oral - Pill -- Oral Phase - Comment --  CHL IP PHARYNGEAL PHASE 09/20/2020 Pharyngeal Phase Impaired Pharyngeal- Pudding Teaspoon -- Pharyngeal -- Pharyngeal- Pudding Cup -- Pharyngeal -- Pharyngeal- Honey Teaspoon -- Pharyngeal -- Pharyngeal- Honey Cup -- Pharyngeal -- Pharyngeal- Nectar Teaspoon -- Pharyngeal -- Pharyngeal- Nectar Cup --  Pharyngeal -- Pharyngeal- Nectar Straw -- Pharyngeal -- Pharyngeal- Thin Teaspoon NT Pharyngeal -- Pharyngeal- Thin Cup WFL Pharyngeal -- Pharyngeal- Thin Straw Reduced laryngeal elevation;Penetration/Aspiration during swallow Pharyngeal Material enters airway, remains ABOVE vocal cords and not ejected out Pharyngeal- Puree WFL Pharyngeal -- Pharyngeal- Mechanical Soft WFL Pharyngeal -- Pharyngeal- Regular -- Pharyngeal -- Pharyngeal- Multi-consistency -- Pharyngeal -- Pharyngeal- Pill WFL Pharyngeal -- Pharyngeal Comment --  CHL IP CERVICAL ESOPHAGEAL PHASE 09/20/2020 Cervical Esophageal Phase WFL Pudding Teaspoon -- Pudding Cup -- Honey Teaspoon -- Honey Cup -- Nectar Teaspoon -- Nectar Cup -- Nectar Straw -- Thin Teaspoon -- Thin Cup -- Thin Straw -- Puree -- Mechanical Soft -- Regular -- Multi-consistency -- Pill -- Cervical Esophageal Comment -- McCoy Leah Meryl 09/20/2020, 9:32 AM              ECHOCARDIOGRAM COMPLETE  Result Date: 09/21/2020    ECHOCARDIOGRAM REPORT   Patient Name:   TATSUO MUSIAL [facility] Date of Exam: 09/21/2020 Medical Rec #:  [id number]         Height:       65.0 in Accession #:    [id number]        Weight:       139.6 lb Date of Birth:  28-Dec-1955          BSA:          1.698 m Patient Age:    24 years          BP:           103/66 mmHg Patient Gender: M                 HR:           95 bpm. Exam Location:  Inpatient Procedure: 2D Echo, 3D Echo, Cardiac Doppler and Color Doppler Indications:    Bacteremia  History:        Patient has prior history of Echocardiogram examinations, most                 recent  07/09/2019. Abnormal ECG, Arrythmias:Tachycardia,                 Signs/Symptoms:Bacteremia, Shortness of Breath and Dyspnea; Risk                 Factors:Sleep Apnea, Hypertension and Diabetes. Cancer. Right                 pneumothorax. Subcutaneous air. Hypoxia.  Sonographer:    Roseanna Rainbow RDCS Referring Phys: [place] Comments: Technically difficult study due to  poor echo windows, suboptimal parasternal window, suboptimal apical window and suboptimal subcostal window. Patient has trach collar. Off aix medial images in apical region. Feeding tube in subcostal region. IMPRESSIONS  1. Left ventricular ejection fraction, by estimation, is 60 to 65%. The left ventricle has normal function. The left ventricle has no regional wall motion abnormalities. There is mild left ventricular hypertrophy. Left ventricular diastolic parameters were normal.  2. Right ventricular systolic function is normal. The right ventricular size is moderately enlarged. There is normal pulmonary artery systolic pressure.  3. The mitral valve is normal in structure. No evidence of mitral valve regurgitation.  4. The aortic valve was not well visualized. Aortic valve regurgitation is not visualized. No aortic stenosis is present.  5. The inferior vena cava is normal in size with greater than 50% respiratory variability, suggesting right atrial pressure of 3 mmHg. Conclusion(s)/Recommendation(s): Techincally difficult study, but no vegetaion seen. If high clinical supicion for endocarditis, consider TEE. FINDINGS  Left Ventricle: Left ventricular ejection fraction, by estimation, is 60 to 65%. The left ventricle has normal function. The left ventricle has no regional wall motion abnormalities. The left ventricular internal cavity size was small. There is mild left ventricular hypertrophy. Left ventricular diastolic parameters were normal. Right Ventricle: The right ventricular size is moderately enlarged. Right vetricular wall thickness was not well visualized. Right ventricular systolic function is normal. There is normal pulmonary artery systolic pressure. The tricuspid regurgitant velocity is 2.78 m/s, and with an assumed right atrial pressure of 3 mmHg, the estimated right ventricular systolic pressure is 23.5 mmHg. Left Atrium: Left atrial size was normal in size. Right Atrium: Right atrial size was  normal in size. Pericardium: There is no evidence of pericardial effusion. Mitral Valve: The mitral valve is normal in structure. No evidence of mitral valve regurgitation. Tricuspid Valve: The tricuspid valve is normal in structure. Tricuspid valve regurgitation is trivial. Aortic Valve: The aortic valve was not well visualized. Aortic valve regurgitation is not visualized. No aortic stenosis is present. Pulmonic Valve: The pulmonic valve was not well visualized. Pulmonic valve regurgitation is not visualized. Aorta: The aortic root is normal in size and structure. Venous: The inferior vena cava is normal in size with greater than 50% respiratory variability, suggesting right atrial pressure of 3 mmHg. IAS/Shunts: The interatrial septum was not well visualized.  LEFT VENTRICLE PLAX 2D LVIDd:         3.60 cm      Diastology LVIDs:         2.30 cm      LV e' medial:    11.20 cm/s LV PW:         1.30 cm      LV E/e' medial:  8.5 LV IVS:        0.90 cm      LV e' lateral:   12.00 cm/s LVOT diam:     2.20 cm      LV E/e'  lateral: 7.9 LV SV:         57 LV SV Index:   34 LVOT Area:     3.80 cm  LV Volumes (MOD) LV vol d, MOD A2C: 66.9 ml LV vol d, MOD A4C: 101.0 ml LV vol s, MOD A2C: 22.7 ml LV vol s, MOD A4C: 24.2 ml LV SV MOD A2C:     44.2 ml LV SV MOD A4C:     101.0 ml LV SV MOD BP:      78.9 ml RIGHT VENTRICLE             IVC RV S prime:     18.20 cm/s  IVC diam: 1.80 cm TAPSE (M-mode): 2.0 cm LEFT ATRIUM           Index       RIGHT ATRIUM           Index LA diam:      3.40 cm 2.00 cm/m  RA Area:     15.10 cm LA Vol (A2C): 13.3 ml 7.83 ml/m  RA Volume:   34.90 ml  20.56 ml/m LA Vol (A4C): 28.1 ml 16.55 ml/m  AORTIC VALVE LVOT Vmax:   126.00 cm/s LVOT Vmean:  71.400 cm/s LVOT VTI:    0.150 m  AORTA Ao Root diam: 3.40 cm Ao Asc diam:  3.70 cm MITRAL VALVE               TRICUSPID VALVE MV Area (PHT): 5.19 cm    TR Peak grad:   30.9 mmHg MV Decel Time: 146 msec    TR Vmax:        278.00 cm/s MV E velocity: 95.14  cm/s MV A velocity: 84.80 cm/s  SHUNTS MV E/A ratio:  1.12        Systemic VTI:  0.15 m                            Systemic Diam: 2.20 cm Oswaldo Milian MD Electronically signed by Oswaldo Milian MD Signature Date/Time: 09/21/2020/6:08:46 PM    Final    VAS [place] LOWER EXTREMITY VENOUS (DVT)  Result Date: 09/25/2020  Lower Venous DVT Study Patient Name:  BANE HAGY [facility]  Date of Exam:   09/24/2020 Medical Rec #: [id number]          Accession #:    [id number] Date of Birth: 1956/04/11           Patient Gender: M Patient Age:   64Y Exam Location:  [facility] Procedure:      VAS [place] LOWER EXTREMITY VENOUS (DVT) Referring Phys: [id number] MURALI RAMASWAMY --------------------------------------------------------------------------------  Indications: Post-Covid severe pulmonary fibrosis, relapsed Hodkin's lymphoma, edema.  Comparison       07-09-2019 Bilateral lower extremity venous was negative for Study:           DVT. Performing Technologist: Darlin Coco RDMS,RVT  Examination Guidelines: A complete evaluation includes B-mode imaging, spectral Doppler, color Doppler, and power Doppler as needed of all accessible portions of each vessel. Bilateral testing is considered an integral part of a complete examination. Limited examinations for reoccurring indications may be performed as noted. The reflux portion of the exam is performed with the patient in reverse Trendelenburg.  +---------+---------------+---------+-----------+----------+--------------+ RIGHT    CompressibilityPhasicitySpontaneityPropertiesThrombus Aging +---------+---------------+---------+-----------+----------+--------------+ CFV      Full           Yes      Yes                                 +---------+---------------+---------+-----------+----------+--------------+  SFJ      Full                                                        +---------+---------------+---------+-----------+----------+--------------+ FV Prox   Full                                                        +---------+---------------+---------+-----------+----------+--------------+ FV Mid   Full                                                        +---------+---------------+---------+-----------+----------+--------------+ FV DistalFull                                                        +---------+---------------+---------+-----------+----------+--------------+ PFV      Full                                                        +---------+---------------+---------+-----------+----------+--------------+ POP      Full           Yes      Yes                                 +---------+---------------+---------+-----------+----------+--------------+ PTV      Full                                                        +---------+---------------+---------+-----------+----------+--------------+ PERO     Full                                                        +---------+---------------+---------+-----------+----------+--------------+   +---------+---------------+---------+-----------+----------+--------------+ LEFT     CompressibilityPhasicitySpontaneityPropertiesThrombus Aging +---------+---------------+---------+-----------+----------+--------------+ CFV      Full           Yes      Yes                                 +---------+---------------+---------+-----------+----------+--------------+ SFJ      Full                                                        +---------+---------------+---------+-----------+----------+--------------+  FV Prox  Full                                                        +---------+---------------+---------+-----------+----------+--------------+ FV Mid   Full                                                        +---------+---------------+---------+-----------+----------+--------------+ FV DistalFull                                                         +---------+---------------+---------+-----------+----------+--------------+ PFV      Full                                                        +---------+---------------+---------+-----------+----------+--------------+ POP      Full           Yes      Yes                                 +---------+---------------+---------+-----------+----------+--------------+ PTV      Full                                                        +---------+---------------+---------+-----------+----------+--------------+ PERO     Full                                                        +---------+---------------+---------+-----------+----------+--------------+     Summary: RIGHT: - There is no evidence of deep vein thrombosis in the lower extremity.  - No cystic structure found in the popliteal fossa.  - Ultrasound characteristics of enlarged lymph nodes are noted in the groin.  LEFT: - There is no evidence of deep vein thrombosis in the lower extremity.  - No cystic structure found in the popliteal fossa.  *See table(s) above for measurements and observations. Electronically signed by Harold Barban MD on 09/25/2020 at 10:25:35 PM.    Final    [place] EKG SITE RITE  Result Date: 10/06/2020 If Site Rite image not attached, placement could not be confirmed due to current cardiac rhythm.  US Abdomen Limited RUQ (LIVER/GB)  Result Date: 09/19/2020 CLINICAL DATA:  Transaminitis. EXAM: ULTRASOUND ABDOMEN LIMITED RIGHT UPPER QUADRANT COMPARISON:  None. FINDINGS: Gallbladder: No gallbladder wall thickening. Small amount of biliary sludge. No sonographic Murphy sign noted by sonographer. Common bile duct: Diameter: 3 mm, normal Liver: No focal lesion identified. Within normal limits in  parenchymal echogenicity. Portal vein is patent on color Doppler imaging with normal direction of blood flow towards the liver. Other: None. IMPRESSION: Biliary sludge without evidence of acute cholecystitis.  Electronically Signed   By: Valentino Saxon MD   On: 09/19/2020 16:18    Lab Data:  CBC: Recent Labs  Lab 10/06/20 0237 10/11/20 0521  WBC 13.1* 7.3  HGB 11.2* 9.9*  HCT 39.1 34.4*  MCV 100.0 101.2*  PLT 223 989*   Basic Metabolic Panel: Recent Labs  Lab 10/06/20 0237 10/07/20 0235 10/08/20 0258 10/09/20 0500 10/10/20 0400 10/11/20 0521  NA 142  144 141 141 140 142 145  K 4.7  4.8 4.6 4.4 4.3 4.1 4.3  CL 92*  91* 89* 89* 88* 90* 93*  CO2 45*  45* 46* 46* >50* 45* 46*  GLUCOSE 86  85 93 99 134* 138* 135*  BUN 27*  27* 25* 28* 23 32* 36*  CREATININE 0.52*  0.59* 0.47* 0.49* 0.43* 0.64 0.58*  CALCIUM 10.5*  10.5* 10.1 10.1 10.2 10.7* 10.8*  MG 2.4  --   --   --   --   --   PHOS 4.2  4.2 3.6 3.4 3.7 4.8* 4.2   GFR: Estimated Creatinine Clearance: 80.1 mL/min (A) (by C-G formula based on SCr of 0.58 mg/dL (L)). Liver Function Tests: Recent Labs  Lab 10/07/20 0235 10/08/20 0258 10/09/20 0500 10/10/20 0400 10/11/20 0521  ALBUMIN 2.7* 2.7* 2.8* 2.8* 2.8*   No results for input(s): LIPASE, AMYLASE in the last 168 hours. No results for input(s): AMMONIA in the last 168 hours. Coagulation Profile: No results for input(s): INR, PROTIME in the last 168 hours.  Cardiac Enzymes: No results for input(s): CKTOTAL, CKMB, CKMBINDEX, TROPONINI in the last 168 hours. BNP (last 3 results) No results for input(s): PROBNP in the last 8760 hours. HbA1C: No results for input(s): HGBA1C in the last 72 hours. CBG: Recent Labs  Lab 10/10/20 1936 10/10/20 2320 10/11/20 0401 10/11/20 0734 10/11/20 1203  GLUCAP 119* 121* 124* 140* 134*   Lipid Profile: No results for input(s): CHOL, HDL, LDLCALC, TRIG, CHOLHDL, LDLDIRECT in the last 72 hours. Thyroid Function Tests: No results for input(s): TSH, T4TOTAL, FREET4, T3FREE, THYROIDAB in the last 72 hours. Anemia Panel: No results for input(s): VITAMINB12, FOLATE, FERRITIN, TIBC, IRON, RETICCTPCT in the last 72  hours. Urine analysis:    Component Value Date/Time   COLORURINE YELLOW 09/19/2020 1511   APPEARANCEUR HAZY (A) 09/19/2020 1511   LABSPEC 1.012 09/19/2020 1511   PHURINE 7.0 09/19/2020 1511   GLUCOSEU NEGATIVE 09/19/2020 1511   HGBUR NEGATIVE 09/19/2020 1511   HGBUR negative 03/23/2007 0000   BILIRUBINUR NEGATIVE 09/19/2020 1511   BILIRUBINUR neg 11/11/2016 1017   KETONESUR NEGATIVE 09/19/2020 1511   PROTEINUR NEGATIVE 09/19/2020 1511   UROBILINOGEN 0.2 11/11/2016 1017   UROBILINOGEN 0.2 11/08/2012 0915   NITRITE NEGATIVE 09/19/2020 1511   LEUKOCYTESUR NEGATIVE 09/19/2020 1511     Loralei Radcliffe M.D. [facility] 10/11/2020, 12:55 PM  Available via Epic secure chat 7am-7pm After 7 pm, please refer to night coverage provider listed on amion.

## 2020-10-11 NOTE — Progress Notes (Signed)
Removed PT from vent and placed on ATC 60% (10 LPM 02). PT is tolerating well at this time. RN aware.

## 2020-10-12 ENCOUNTER — Other Ambulatory Visit: Payer: Self-pay | Admitting: Hematology & Oncology

## 2020-10-12 DIAGNOSIS — R627 Adult failure to thrive: Secondary | ICD-10-CM | POA: Diagnosis not present

## 2020-10-12 DIAGNOSIS — J9611 Chronic respiratory failure with hypoxia: Secondary | ICD-10-CM | POA: Diagnosis not present

## 2020-10-12 DIAGNOSIS — G620 Drug-induced polyneuropathy: Secondary | ICD-10-CM | POA: Diagnosis not present

## 2020-10-12 DIAGNOSIS — C8112 Nodular sclerosis classical Hodgkin lymphoma, intrathoracic lymph nodes: Secondary | ICD-10-CM

## 2020-10-12 DIAGNOSIS — R7881 Bacteremia: Secondary | ICD-10-CM | POA: Diagnosis not present

## 2020-10-12 LAB — RENAL FUNCTION PANEL
Albumin: 2.8 g/dL — ABNORMAL LOW (ref 3.5–5.0)
Anion gap: 6 (ref 5–15)
BUN: 36 mg/dL — ABNORMAL HIGH (ref 8–23)
CO2: 45 mmol/L — ABNORMAL HIGH (ref 22–32)
Calcium: 10.4 mg/dL — ABNORMAL HIGH (ref 8.9–10.3)
Chloride: 94 mmol/L — ABNORMAL LOW (ref 98–111)
Creatinine, Ser: 0.54 mg/dL — ABNORMAL LOW (ref 0.61–1.24)
GFR, Estimated: >60 mL/min (ref >60)
Glucose, Bld: 161 mg/dL — ABNORMAL HIGH (ref 70–99)
Phosphorus: 2.9 mg/dL (ref 2.5–4.6)
Potassium: 4.2 mmol/L (ref 3.5–5.1)
Sodium: 145 mmol/L (ref 135–145)

## 2020-10-12 LAB — GLUCOSE, CAPILLARY
Glucose-Capillary: 111 mg/dL — ABNORMAL HIGH (ref 70–99)
Glucose-Capillary: 117 mg/dL — ABNORMAL HIGH (ref 70–99)
Glucose-Capillary: 134 mg/dL — ABNORMAL HIGH (ref 70–99)
Glucose-Capillary: 139 mg/dL — ABNORMAL HIGH (ref 70–99)
Glucose-Capillary: 144 mg/dL — ABNORMAL HIGH (ref 70–99)
Glucose-Capillary: 158 mg/dL — ABNORMAL HIGH (ref 70–99)

## 2020-10-12 NOTE — Progress Notes (Signed)
Physical Therapy Treatment Patient Details Name: Steve [name] MRN: [id number] DOB: 1955-05-21 Today's Date: 10/12/2020    History of Present Illness Patient  is a 65 year old male who was admitted for possible pneumonia and found to have a staph epidermidis bacteremia. 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  0n 09/24/20, developed respiratory distress with increased oxygen requirement and was transferred to ICU and started on pressure support ventilation. Currently on trach collar on 10L fiO2 at 60% with intermittent vent support.    PT Comments    Patient continues to progress mobility and complete sit<>stand and stand step transfers with platform walker and Min assist. Pt on 10L/min (60% FiO2) at rest. Trach collar connected to portable O2 with connector set at 10L (45% FiO2) pt saturating at 84-90%. Placed on 14L (55% FiO2) and sats remained 93% or greater with gait/mobility. He will continue to benefit from skilled PT interventions in acute setting to progress independence with mobility.     Follow Up Recommendations  No PT follow up     Equipment Recommendations  None recommended by PT    Recommendations for Other Services       Precautions / Restrictions Precautions Precautions: Fall Precaution Comments: trach collar at 10L at 60% fio2, intermittent vent use, PEG tube Restrictions Weight Bearing Restrictions: No    Mobility  Bed Mobility Overal bed mobility: Needs Assistance Bed Mobility: Supine to Sit     Supine to sit: Supervision;HOB elevated     General bed mobility comments: supervision to transfer to edge of bed.    Transfers Overall transfer level: Needs assistance Equipment  used: Bilateral platform walker Transfers: Sit to/from Stand;Stand Pivot Transfers Sit to Stand: Min assist;Min guard;From elevated surface Stand pivot transfers: Min assist       General transfer comment: assist to rise from lower recliner level with min cues for use of Bil UE's for power up. min guard to rise from elevated EOB.  Ambulation/Gait Ambulation/Gait assistance: Mod assist;+2 safety/equipment;Min assist Gait Distance (Feet): 35 Feet (15,20) Assistive device: Bilateral platform walker Gait Pattern/deviations: Step-to pattern;Trunk flexed;Narrow base of support Gait velocity: decreased   General Gait Details: cues for posture and Mod assist to manage EVA walker with gait. pt abmbulated 2 short bouts and VSS throughout. Pt on trach colar with 14L (55% FiO2) via trach connector to portable O2. Sats remained 93% or better.   Stairs             Wheelchair Mobility    Modified Rankin (Stroke Patients Only)       Balance Overall balance assessment: Needs assistance Sitting-balance support: No upper extremity supported;Feet supported Sitting balance-Leahy Scale: Fair       Standing balance-Leahy Scale: Poor Standing balance comment: reliant on external support of RW. pt stood for ~3 minutes at EOB for clean up from BM. then transfered to chair for seated rest prior to gait.                            Cognition Arousal/Alertness: Awake/alert Behavior During Therapy: WFL for tasks assessed/performed Overall Cognitive Status: Within Functional Limits for tasks assessed  General Comments: pleasant and motivated.  Non-verbal due to [facility] but does mouth words and use hand gestures      Exercises      General Comments        Pertinent Vitals/Pain Pain Assessment: No/denies pain    Home Living                      Prior Function            PT Goals (current goals can now be found in the  care plan section) Acute Rehab PT Goals PT Goal Formulation: With patient Time For Goal Achievement: 10/15/20 Potential to Achieve Goals: Good Progress towards PT goals: Progressing toward goals    Frequency    Min 3X/week      PT Plan Current plan remains appropriate    Co-evaluation              AM-PAC PT "6 Clicks" Mobility   Outcome Measure  Help needed turning from your back to your side while in a flat bed without using bedrails?: A Little Help needed moving from lying on your back to sitting on the side of a flat bed without using bedrails?: A Little Help needed moving to and from a bed to a chair (including a wheelchair)?: A Little Help needed standing up from a chair using your arms (e.g., wheelchair or bedside chair)?: A Little Help needed to walk in hospital room?: A Lot Help needed climbing 3-5 steps with a railing? : Total 6 Click Score: 15    End of Session Equipment Utilized During Treatment: Gait belt Activity Tolerance: Patient tolerated treatment well;Patient limited by fatigue Patient left: in chair;with call bell/phone within reach;with chair alarm set Nurse Communication: Mobility status PT Visit Diagnosis: Unsteadiness on feet (R26.81);Muscle weakness (generalized) (M62.81)     Time: 1610-9604 PT Time Calculation (min) (ACUTE ONLY): 32 min  Charges:  $Gait Training: 8-22 mins $Therapeutic Activity: 8-22 mins                     Verner Mould, DPT Acute Rehabilitation Services Office [phone] Pager [phone]    Jacques Navy 10/12/2020, 1:40 PM

## 2020-10-12 NOTE — TOC Progression Note (Signed)
Transition of Care [facility]) - Progression Note    Patient Details  Name: Steve [name] MRN: [id number] Date of Birth: 1956-01-22  [facility]SW Contact  Leeroy Cha, RN Phone Number: 10/12/2020, 9:18 AM  Clinical Narrative:    TOC PLAN OF CARE: Pt is on venturi mask at 10l/min this am.  No changes.  Staying in sdu due to anexity and thick secretions.  Does not want to go to floor due to rapid response last time he went to the regular floors.   Expected Discharge Plan: [place] Barriers to Discharge: Continued Medical Work up  Expected Discharge Plan and Services Expected Discharge Plan: [place] In-house Referral: Clinical Social Work Discharge Planning Services: CM Consult Post Acute Care Choice: [place] arrangements for the past 2 months: Mobile Home                 DME Arranged: N/A DME Agency: NA       HH Arranged: PT [place]: [place] ([place]) Date [place]: 09/22/20   Representative spoke with at [place]: [place] (SDOH) Interventions    Readmission Risk Interventions Readmission Risk Prevention Plan 09/22/2020 08/24/2020 07/22/2019  Transportation Screening Complete Complete Complete  Medication Review Press photographer) Complete Complete Complete  HRI or Home Care Consult Complete Complete -  SW Recovery Care/Counseling Consult Complete Complete -  Palliative Care Screening Not Applicable Not Applicable -  [place] Not Applicable Not Applicable -  Some recent data might be hidden

## 2020-10-12 NOTE — Progress Notes (Signed)
Pharmacy Antibiotic Note  Steve [name] is a 65 y.o. male admitted on 09/19/2020 with possible pneumonia, found to have staph epidermidis bacteremia. Pharmacy has been consulted for Vancomycin dosing. 6/21 Last calculated AUC = 456 (goal 400-550)  Today, 10/12/2020:   SCr remains low/stable at 0.54, WBC wnl  (steroids d/c on 6/24).  TEE delayed ID extended treatment: 6 weeks from first negative blood cultures on 09/22/20 since unable to have TEE 7/1 0500 AM vancomycin trough = 14 mcg/ml, acceptable  Plan: Continue Vancomycin 750mg  q12h. Stop date 11/03/20. OPAT orders pended under discharge orders   Height: 5\' 5"  (165.1 cm) Weight: 69 kg (152 lb 1.9 oz) IBW/kg (Calculated) : 61.5  Temp (24hrs), Avg:98.1 F (36.7 C), Min:97.5 F (36.4 C), Max:98.4 F (36.9 C)  Recent Labs  Lab 10/06/20 0237 10/07/20 0235 10/08/20 0258 10/09/20 0500 10/10/20 0400 10/11/20 0521 10/12/20 0500  WBC 13.1*  --   --   --   --  7.3  --   CREATININE 0.52*  0.59*   < > 0.49* 0.43* 0.64 0.58* 0.54*  VANCOTROUGH  --   --   --  14*  --   --   --    < > = values in this interval not displayed.     Estimated Creatinine Clearance: 80.1 mL/min (A) (by C-G formula based on SCr of 0.54 mg/dL (L)).    No Known Allergies Antimicrobials this admission: 6/11 Vancomycin >> 6/12, resumed 6/12 >  (7/26) 6/11 Cefepime >> 6/15 Dose adjustments this admission: 6/14 0536 VP: 37, 1643 VT: 10 - AUC 527, continue Vanc 750mg  q12h  6/20 VP 1800 = 24; 6/21 VT 0330 = 12; AUC 456 7/1 0500 VT = 14, keep 750 q12 Microbiology results: 6/11 BCx: 4/4 bottles Staph epi (methicillin resistance detected)  6/11 UCx: multiple species 6/11 Sputum:  needs collecting 6/11 Strep pneumo urinary antigen: neg 6/11 Legionella urinary antigen: neg 6/12 MRSA PCR: neg 6/14 BCx: NGF  Thank you for allowing pharmacy to be a part of this patient's care.  Minda Ditto PharmD 10/12/2020, 11:40 AM

## 2020-10-12 NOTE — Progress Notes (Signed)
[facility]                                                                              Patient Demographics  Steve [name], is a 65 y.o. 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  First negative blood cultures 09/22/20.  0n 09/24/20, patient developed respiratory distress with increased oxygen requirement and was transferred to ICU and started on pressure support ventilation.  Critical care and ID consulted.   ID recommended TEE to rule out infective endocarditis, however not stable to have TEE at this time per cardiology.    ID recommended 6 weeks of IV antibiotics from first negative blood cultures on 09/22/20, if unable to have TEE.  Cardiology was reconsulted to reassess if patient is stable for TEE on 6/28 however still having descending episodes and unstable for TEE..     Assessment & Plan    Principal Problem: Sepsis due to pneumonia, staph epidermidis bacteremia: Present on admission -Met sepsis criteria on presentation with tachycardia, tachypnea, leukocytosis, chest x-ray with possible recurrent pneumonia -CT chest with interval increase in bulk of mediastinal and axillary adenopathy. -Blood cultures grew staph  epidermidis, Port-A-Cath was removed on 6/16. -Patient remained on cefepime from 6/11-6/15.  ID recommended TEE to exclude endocarditis, however patient has been unstable for TEE -Continue midodrine for hypotension, stress dose steroids tapered off on 6/24 -ID recommended vancomycin for minimum 6 weeks from date of negative culture on 6/14, will need 4 more weeks upon discharge (if TEE not done). -Sepsis physiology resolved.  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  CCM following.  Hypotension BP stable, continue midodrine 10 mg 3 times daily  Chronic metabolic alkalosis -Received Diamox 500 mg p.o. x1 on 7/1 for bicarb > 50 -Bicarb 45, stable, continue to monitor   Relapsed Hodgkin lymphoma -CT chest concerning for worsening mediastinal and axillary lymphadenopathy -Oncology and palliative medicine following -Seen by Dr. Marin Olp on 6/20, PICC line has been placed  Diabetes mellitus type 2, NIDDM with hyperglycemia, uncontrolled -CBGs fairly stable.  Continue Levemir 10 units nightly,  sliding scale insulin sensitive C  Normocytic anemia, anemia of chronic disease -Hemoglobin stable, 9.9  Hyponatremia, hypokalemia -Currently stable  Pressure injury -Sacrum mid stage I -Wound care per RN  Severe malnutrition secondary to chronic illness, malignancy -Continue tube feeds -  Estimated body mass index is 25.31 kg/m as calculated from the following:   Height as of this encounter: $RemoveBeforeD'5\' 5"'[id number]$  (1.651 m).   Weight as of this encounter: 69 kg.  History of depression, anxiety Increase to Zoloft $RemoveB'100mg'maamyhrB$  daily   Code Status: full  DVT Prophylaxis:   enoxaparin (LOVENOX) injection 40 mg Start: 09/19/20 2200   Level of Care: Level of care: ICU Family Communication: Discussed all imaging results, lab results, explained patient's wife on phone [phone] on 7/3  Disposition Plan:     Status is: Inpatient  Remains inpatient appropriate because:Inpatient level of care appropriate due to severity of illness  Dispo: The patient is from: Home              Anticipated d/c is to: Home              Patient currently is not medically stable to d/c.  Once respiratory status is close to his baseline   Difficult to place patient No  Time Spent in minutes  25 mins   Procedures:  Mech vent   Consultants:   Pulmonary critical care Oncology ID Palliative medicine    Antimicrobials:   Anti-infectives (From admission, onward)    Start     Dose/Rate Route Frequency Ordered Stop   09/23/20 0800  ceFEPIme (MAXIPIME) 2 g in sodium chloride 0.9 % 100 mL IVPB  Status:  Discontinued        2 g 200 mL/hr over 30 Minutes Intravenous Every 8 hours 09/23/20 0738 09/23/20 1758   09/21/20 0400  vancomycin (VANCOREADY) IVPB 750 mg/150 mL        750 mg 150 mL/hr over 60 Minutes Intravenous Every 12 hours 09/20/20 1715 11/04/20 0559   09/20/20 1730  vancomycin (VANCOREADY) IVPB 750 mg/150 mL        750 mg 150 mL/hr over 60 Minutes Intravenous STAT 09/20/20 1714 09/20/20 1954   09/20/20 0400  vancomycin (VANCOREADY) IVPB 750 mg/150 mL  Status:  Discontinued        750 mg 150 mL/hr over 60 Minutes Intravenous Every 12 hours 09/19/20 1932 09/20/20 1155   09/20/20 0000  ceFEPIme (MAXIPIME) 2 g in sodium chloride 0.9 % 100 mL IVPB  Status:  Discontinued        2 g 200 mL/hr over 30 Minutes Intravenous Every 8 hours 09/19/20 1932 09/23/20 0721   09/19/20 1600  ceFEPIme (MAXIPIME) 2 g in sodium chloride 0.9 % 100 mL IVPB        2 g 200 mL/hr over 30 Minutes Intravenous  Once 09/19/20 1548 09/19/20 1634   09/19/20 1600  vancomycin (VANCOREADY) IVPB 1250  mg/250 mL        1,250 mg 166.7 mL/hr over 90 Minutes Intravenous STAT 09/19/20 1557 09/19/20 1822          Medications  Scheduled Meds:  chlorhexidine gluconate (MEDLINE KIT)  15 mL Mouth Rinse BID   Chlorhexidine Gluconate Cloth  6 each Topical Q0600   cholecalciferol  10,000 Units Oral Daily   enoxaparin (LOVENOX) injection  40 mg Subcutaneous Q24H   free water  100 mL Per Tube Q6H   insulin aspart  0-9 Units Subcutaneous Q4H   insulin glargine  10 Units Subcutaneous  QHS   mouth rinse  15 mL Mouth Rinse BID   midodrine  10 mg Per Tube TID WC   multivitamin  15 mL Per Tube Daily   pantoprazole sodium  40 mg Per Tube Daily   sertraline  100 mg Per Tube Daily   sodium chloride flush  10-40 mL Intracatheter Q12H   traZODone  100 mg Per Tube QHS   Continuous Infusions:  sodium chloride 20 mL/hr at 10/04/20 1343   feeding supplement (VITAL AF 1.2 CAL) 1,000 mL (10/12/20 1039)   vancomycin Stopped (10/12/20 0611)   PRN Meds:.albuterol, guaiFENesin, hyoscyamine, LORazepam, sodium chloride flush      Subjective:   Steve [name] was seen and examined today.  Somewhat lethargic but easily arousable.  Did not need event overnight.  No fevers.  No acute issues overnight. Objective:   Vitals:   10/12/20 0800 10/12/20 0904 10/12/20 1000 10/12/20 1103  BP: (!) 101/54  112/85 114/60  Pulse: 72  75 70  Resp:    16  Temp:  98.4 F (36.9 C)    TempSrc:  Axillary    SpO2: 100%  96%   Weight:      Height:        Intake/Output Summary (Last 24 hours) at 10/12/2020 1154 Last data filed at 10/12/2020 1041 Gross per 24 hour  Intake 2478.53 ml  Output 2900 ml  Net -421.47 ml     Wt Readings from Last 3 Encounters:  09/30/20 69 kg  09/18/20 64.4 kg  09/08/20 64.4 kg   Physical Exam General: Somewhat lethargic, arousable, on trach Cardiovascular: S1 S2 clear, RRR. No pedal edema b/l Respiratory: Clear to auscultation anteriorly Gastrointestinal: Soft, nontender,  nondistended, NBS, PEG tube Ext: no pedal edema bilaterally Neuro: no new deficits Psych: somewhat lethargic but arousable    Data Reviewed:  I have personally reviewed following labs and imaging studies  Micro Results No results found for this or any previous visit (from the past 240 hour(s)).  Radiology Reports DG Chest 2 View  Result Date: 09/18/2020 CLINICAL DATA:  Acute on chronic respiratory failure. EXAM: CHEST - 2 VIEW COMPARISON:  Aug 15, 2020. FINDINGS: Stable cardiomegaly. Tracheostomy tube is in good position. Left-sided Port-A-Cath is unchanged. Stable bilateral lung opacities are noted concerning for multifocal pneumonia or chronic scarring. No pneumothorax or pleural effusion is noted. Bony thorax is unremarkable. IMPRESSION: Stable bilateral lung opacities as described above. Electronically Signed   By: Marijo Conception M.D.   On: 09/18/2020 12:59   CT CHEST WO CONTRAST  Result Date: 09/20/2020 CLINICAL DATA:  Respiratory failure.  History of Hodgkin's lymphoma. EXAM: CT CHEST WITHOUT CONTRAST TECHNIQUE: Multidetector CT imaging of the chest was performed following the standard protocol without IV contrast. COMPARISON:  July 21, 2020 FINDINGS: Cardiovascular: Heart is mildly enlarged. LEFT chest port tip terminates at the superior cavoatrial junction. No pericardial effusion. Minimal atherosclerotic calcifications. Enlarged pulmonary artery as can be seen in pulmonary arterial hypertension. Mediastinum/Nodes: Revisualization of extensive adenopathy. LEFT level 3 axillary lymph node measures 11 mm, previously 3 mm (series 2, image 42). LEFT supraclavicular lymph node measures 15 mm, previously 15 mm (series 2, image 24). RIGHT level 1 axillary lymph node measures 8 mm, previously 4 mm (series 2, image 63). RIGHT internal mammary lymph node measures 31 mm, previously 26 mm (series 2, image 77). RIGHT paratracheal conglomeration spanned 59 x 43 mm, previously 44 by 30 mm (series 2,  image 45). There is a lunate configuration of  the trachea. Lungs/Pleura: Visualization of a peripheral rind of fluid adjacent to the RIGHT lung, similar in comparison to prior. Extensive bilateral ground-glass opacities with subpleural reticulation and suggestion of honeycombing. Peribronchovascular nodularity is similar in comparison to prior. Similar appearance of RIGHT middle lobe atelectasis. Upper Abdomen: No acute abnormality. Musculoskeletal: Known osseous metastatic disease is not well visualized. Limited evaluation of the ribs secondary to respiratory motion. No acute thoracic spine fracture. IMPRESSION: 1. Interval increase in bulky adenopathy within the mediastinum consistent with known lymphoma. Increased axillary lymph node size in comparison to prior. 2. Revisualization of sequela of advanced pulmonary fibrosis. Unchanged appearance of peribronchovascular nodularity. This could reflect lymphangitic spread of lymphoma versus underlying infection. 3. Unchanged small rind of RIGHT-sided pleural fluid. Underlying pleural nodularity from lymphoma remains in the differential. 4. 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 5. Enlarged pulmonary artery as can be seen in pulmonary arterial hypertension. Electronically Signed   By: Valentino Saxon MD   On: 09/20/2020 17:25   CT Angio Chest Pulmonary Embolism (PE) W or WO Contrast  Result Date: 09/25/2020 CLINICAL DATA:  Cough and hypoxia.  COVID-19. EXAM: CT ANGIOGRAPHY CHEST WITH CONTRAST TECHNIQUE: Multidetector CT imaging of the chest was performed using the standard protocol during bolus administration of intravenous contrast. Multiplanar CT image reconstructions and MIPs were obtained to evaluate the vascular anatomy. CONTRAST:  66mL OMNIPAQUE IOHEXOL 350 MG/ML SOLN COMPARISON:  09/20/2020 FINDINGS: Cardiovascular: Contrast injection is sufficient to demonstrate satisfactory opacification of the pulmonary  arteries to the segmental level. There is no pulmonary embolus or evidence of right heart strain. The size of the main pulmonary artery is normal. Heart size is normal, with no pericardial effusion. The course and caliber of the aorta are normal. There is no atherosclerotic calcification. No acute aortic syndrome. Mediastinum/Nodes: Bulky mediastinal adenopathy is unchanged since the recent study of 09/20/2020. Lungs/Pleura: Tracheostomy tube tip is at the level of the clavicular heads. There is increased pleural fluid on the right. Diffuse pleural thickening is unchanged. Unchanged appearance of diffuse parenchymal interstitial abnormality. Upper Abdomen: Contrast bolus timing is not optimized for evaluation of the abdominal organs. The visualized portions of the organs of the upper abdomen are normal. Musculoskeletal: No chest wall abnormality. No bony spinal canal stenosis. Review of the MIP images confirms the above findings. IMPRESSION: 1. No pulmonary embolus or acute aortic syndrome. 2. Bulky mediastinal adenopathy and diffuse pleural, interstitial and parenchymal pulmonary abnormalities are unchanged since 09/20/2020. Electronically Signed   By: Ulyses Jarred M.D.   On: 09/25/2020 02:54   IR REMOVAL TUN ACCESS W/ PORT W/O FL MOD SED  Result Date: 09/22/2020 INDICATION: 65 year old male with a history of Hodgkin lymphoma currently presenting with bacteremia associated with pneumonia. He has a left chest port catheter which was placed by interventional radiology (Dr. Annamaria Boots) in December of 2020. he presents for port catheter removal to help facilitate clearance of bacteremia. EXAM: REMOVAL RIGHT IJ VEIN PORT-A-CATH MEDICATIONS: None ANESTHESIA/SEDATION: Moderate (conscious) sedation was employed during this procedure. A total of Versed 1 mg and Fentanyl 50 mcg was administered intravenously. Moderate Sedation Time: 12 minutes. The patient's level of consciousness and vital signs were monitored continuously  by radiology nursing throughout the procedure under my direct supervision. FLUOROSCOPY TIME:  None. COMPLICATIONS: None immediate. PROCEDURE: Informed written consent was obtained from the patient after a thorough discussion of the procedural risks, benefits and alternatives. All questions were addressed. Maximal Sterile Barrier Technique was utilized including caps, mask,  sterile gowns, sterile gloves, sterile drape, hand hygiene and skin antiseptic. A timeout was performed prior to the initiation of the procedure. The right chest was prepped and draped in a sterile fashion. Lidocaine was utilized for local anesthesia. An incision was made over the previously healed surgical incision. Utilizing blunt dissection, the port catheter and reservoir were removed from the underlying subcutaneous tissue in their entirety. Securing sutures were also removed. The pocket was irrigated with a copious amount of sterile normal saline. The wound was examined. No evidence of purulence. The pocket was closed with interrupted 3-0 Vicryl stitches. The subcutaneous tissue was closed with 3-0 Vicryl interrupted subcutaneous stitches. Dermabond was applied. IMPRESSION: Successful right IJ vein Port-A-Cath explant. Electronically Signed   By: Jacqulynn Cadet M.D.   On: 09/22/2020 16:18   DG CHEST PORT 1 VIEW  Result Date: 09/24/2020 CLINICAL DATA:  Respiratory distress EXAM: PORTABLE CHEST 1 VIEW COMPARISON:  09/19/2020 FINDINGS: Lung volumes are small with elevation of the right hemidiaphragm, stable since prior examination. Superimposed multifocal pulmonary infiltrates likely related to chronic underlying interstitial lung disease appears stable, better assessed on CT examination of 09/20/2020. Superimposed right pleural thickening is again noted. Tracheostomy unchanged. Cardiac size is enlarged with associated superior mediastinal widening again noted related to extensive mediastinal and pericardial adenopathy better  demonstrated on prior CT examination. No acute bone abnormality. IMPRESSION: Stable diffuse pulmonary infiltrates likely related to chronic underlying interstitial lung disease and asymmetric right pleural thickening. Stable enlargement of the cardiomediastinal silhouette related to extensive mediastinal and pericardial adenopathy better appreciated on prior CT examination. Electronically Signed   By: Fidela Salisbury MD   On: 09/24/2020 11:41   DG Chest Port 1 View  Result Date: 09/19/2020 CLINICAL DATA:  Shortness of breath. EXAM: PORTABLE CHEST 1 VIEW COMPARISON:  Chest x-ray dated September 18, 2020. FINDINGS: Unchanged tracheostomy tube and left chest wall port catheter. Stable cardiomegaly and mediastinal widening related to underlying lymphadenopathy. Unchanged diffuse patchy nodular opacities throughout both lungs superimposed on chronic pulmonary fibrosis. Unchanged small right pleural effusion. No pneumothorax. No acute osseous abnormality. IMPRESSION: 1. Unchanged multifocal pneumonia superimposed on chronic severe pulmonary fibrosis. 2. Unchanged mediastinal lymphadenopathy. Electronically Signed   By: Titus Dubin M.D.   On: 09/19/2020 15:39   DG Swallowing Func-Speech Pathology  Result Date: 09/20/2020 Formatting of this result is different from the original. Objective Swallowing Evaluation: Type of Study: MBS-Modified Barium Swallow Study  Patient Details Name: Steve [name] MRN: [id number] Date of Birth: 12/17/1955 Today's Date: 09/20/2020 Time: SLP Start Time (ACUTE ONLY): 0903 -SLP Stop Time (ACUTE ONLY): 5498 SLP Time Calculation (min) (ACUTE ONLY): 20 min Past Medical History: Past Medical History: Diagnosis Date  Abscess of muscle 08/10/2011  staph infection of right hip   Acute on chronic respiratory failure with hypoxia ([name])   Acute respiratory distress syndrome (ARDS) due to COVID-19 virus ([place])   Anxiety   Diabetes mellitus without complication ([name])   GERD (gastroesophageal reflux  disease)   rare reflux - no meds for reflux - NO PROBLEM IN PAST SEVERAL YRS  HCAP (healthcare-associated pneumonia)   Hip dysplasia, congenital   no surgery as a child for hip dysplasia - has had bilateral hip replacements as an adult  Hodgkin lymphoma ([place])   Hypertension   Pancreatitis   Pneumothorax, acute   Postoperative anemia due to acute blood loss 09/07/2012  Septic arthritis of hip ([place]) 09/05/2012  PT'S TOTAL HIP JOINT REMOVED - ANTIBIOTIC SPACE PLACED AND PT HAS FINISHED  IV ANTIBIOTICS ( PICC LINE REMOVED)  Sleep apnea   USES CPAP Past Surgical History: Past Surgical History: Procedure Laterality Date  COLONOSCOPY  04/26/2007  HERNIA REPAIR    inguinal hernia x3  IR GASTROSTOMY TUBE MOD SED  08/22/2019  IR IMAGING GUIDED PORT INSERTION  03/29/2019  JOINT REPLACEMENT  2002 & 2007  bilateral hip replacement  LYMPH NODE BIOPSY Right 03/20/2019  Procedure: DEEP RIGHT SUPRACLAVICULAR LYMPH NODE EXCISION;  Surgeon: Fanny Skates, MD;  Location: [facility];  Service: General;  Laterality: Right;  MULTIPLE EXTRACTIONS WITH ALVEOLOPLASTY  07/28/2011  Procedure: MULTIPLE EXTRACION WITH ALVEOLOPLASTY;  Surgeon: Lenn Cal, DDS;  Location: [facility];  Service: Oral Surgery;  Laterality: N/A;  Extraction of tooth #'s 2,3,4,5,6,11,12,13,15,19,22 with alveoloplasty.  shoulder repair - right for separation of shoulder    TEE WITHOUT CARDIOVERSION  07/29/2011  Procedure: TRANSESOPHAGEAL ECHOCARDIOGRAM (TEE);  Surgeon: Josue Hector, MD;  Location: [facility];  Service: Cardiovascular;  Laterality: N/A;  TOTAL HIP REVISION Right 09/05/2012  Procedure: RIGHT HIP RESECTION ARTHROPLASTY WITH ANTIBIOTIC SPACERS;  Surgeon: [name], MD;  Location: [facility];  Service: Orthopedics;  Laterality: Right;  TOTAL HIP REVISION Right 11/30/2012  Procedure: RIGHT TOTAL HIP ARTHROPLASTY REIMPLANTATION;  Surgeon: [name], MD;  Location: [facility];  Service: Orthopedics;  Laterality: Right; HPI: Patient is a  65 year old Caucasian  male with medical history significant of chronic tracheostomy after prolonged hospitalization in 2021 related with pneumonia due to [id number] complicated by ARDS and pulmonary fibrosis +/-pneumonitis from brentuximab, s/p PEG, HTN, DM type II, Hodgkin's lymphoma on chemotherapy and GERD.  Patient was discharged from this hospital on 08/25/2020.  Patient was said to have done very well for the first 3 weeks.  Over the last 5 days, patient has been failing to thrive, with associated poor appetite and malaise.  Readmitted with unchanged multifocal PNA.  Subjective: pt alert, eager to go home Assessment / Plan / Recommendation CHL IP CLINICAL IMPRESSIONS 09/20/2020 Clinical Impression Patient presents with excellent oropharyngeal swallowing function. Oral phase timely and with full clearance of bolus. Pharyngeally, strength has improved from previous MBS 03/2020 in that patient has no pharyngeal residue post swallow. Intermittent trace penetration of thin liquids noted, primarily via straw, due to mildly decreased laryngeal closure noted, remaining above the vocal cords and clearing with either subsequent dry swallow or cued throat clear. Tested all consistencies both with and without PMSV in place with no notable difference in swallowing function. Educated patient and spouse (via phone per patient request) on results of testing and recommendations which include use of PMV if able (not necessary) to faciliate improved strength of cough/throat clear if needed. Both verbalized understanding. SLP will f/u briefly for tolerance and continued education as needed. Do not suspect that dysphagia is resulting in recurrent PNAs. Cannot however r/o contribution of PEG tube feedings on aspiration, particularly because patient is also taking pos by mouth in conjunction with tube feeds, and complains of feeling full often. Recommend dietary consult to ensure that patient is being fed via tube the correct amount  and in the correct manner based on how much he consuming by mouth. SLP Visit Diagnosis Dysphagia, pharyngeal phase (R13.13) Attention and concentration deficit following -- Frontal lobe and executive function deficit following -- Impact on safety and function Mild aspiration risk   CHL IP TREATMENT RECOMMENDATION 09/20/2020 Treatment Recommendations Therapy as outlined in treatment plan below   Prognosis 08/23/2020 Prognosis for Safe Diet Advancement Good Barriers to  Reach Goals -- Barriers/Prognosis Comment -- CHL IP DIET RECOMMENDATION 09/20/2020 SLP Diet Recommendations Regular solids;Thin liquid Liquid Administration via Cup;Straw Medication Administration Whole meds with liquid Compensations Slow rate;Small sips/bites Postural Changes Remain semi-upright after after feeds/meals (Comment);Seated upright at 90 degrees   CHL IP OTHER RECOMMENDATIONS 09/20/2020 Recommended Consults Other (Comment) Oral Care Recommendations Oral care BID Other Recommendations --   CHL IP FOLLOW UP RECOMMENDATIONS 09/20/2020 Follow up Recommendations None   CHL IP FREQUENCY AND DURATION 09/20/2020 Speech Therapy Frequency (ACUTE ONLY) min 1 x/week Treatment Duration 1 week      CHL IP ORAL PHASE 09/20/2020 Oral Phase WFL Oral - Pudding Teaspoon -- Oral - Pudding Cup -- Oral - Honey Teaspoon -- Oral - Honey Cup -- Oral - Nectar Teaspoon -- Oral - Nectar Cup -- Oral - Nectar Straw -- Oral - Thin Teaspoon -- Oral - Thin Cup -- Oral - Thin Straw -- Oral - Puree -- Oral - Mech Soft -- Oral - Regular -- Oral - Multi-Consistency -- Oral - Pill -- Oral Phase - Comment --  CHL IP PHARYNGEAL PHASE 09/20/2020 Pharyngeal Phase Impaired Pharyngeal- Pudding Teaspoon -- Pharyngeal -- Pharyngeal- Pudding Cup -- Pharyngeal -- Pharyngeal- Honey Teaspoon -- Pharyngeal -- Pharyngeal- Honey Cup -- Pharyngeal -- Pharyngeal- Nectar Teaspoon -- Pharyngeal -- Pharyngeal- Nectar Cup -- Pharyngeal -- Pharyngeal- Nectar Straw -- Pharyngeal -- Pharyngeal- Thin Teaspoon  NT Pharyngeal -- Pharyngeal- Thin Cup WFL Pharyngeal -- Pharyngeal- Thin Straw Reduced laryngeal elevation;Penetration/Aspiration during swallow Pharyngeal Material enters airway, remains ABOVE vocal cords and not ejected out Pharyngeal- Puree WFL Pharyngeal -- Pharyngeal- Mechanical Soft WFL Pharyngeal -- Pharyngeal- Regular -- Pharyngeal -- Pharyngeal- Multi-consistency -- Pharyngeal -- Pharyngeal- Pill WFL Pharyngeal -- Pharyngeal Comment --  CHL IP CERVICAL ESOPHAGEAL PHASE 09/20/2020 Cervical Esophageal Phase WFL Pudding Teaspoon -- Pudding Cup -- Honey Teaspoon -- Honey Cup -- Nectar Teaspoon -- Nectar Cup -- Nectar Straw -- Thin Teaspoon -- Thin Cup -- Thin Straw -- Puree -- Mechanical Soft -- Regular -- Multi-consistency -- Pill -- Cervical Esophageal Comment -- Steve [name] 09/20/2020, 9:32 AM              ECHOCARDIOGRAM COMPLETE  Result Date: 09/21/2020    ECHOCARDIOGRAM REPORT   Patient Name:   Steve [name] Date of Exam: 09/21/2020 Medical Rec #:  [id number]         Height:       65.0 in Accession #:    [id number]        Weight:       139.6 lb Date of Birth:  1955/07/17          BSA:          1.698 m Patient Age:    3 years          BP:           103/66 mmHg Patient Gender: M                 HR:           95 bpm. Exam Location:  Inpatient Procedure: 2D Echo, 3D Echo, Cardiac Doppler and Color Doppler Indications:    Bacteremia  History:        Patient has prior history of Echocardiogram examinations, most                 recent 07/09/2019. Abnormal ECG, Arrythmias:Tachycardia,  Signs/Symptoms:Bacteremia, Shortness of Breath and Dyspnea; Risk                 Factors:Sleep Apnea, Hypertension and Diabetes. Cancer. Right                 pneumothorax. Subcutaneous air. Hypoxia.  Sonographer:    Roseanna Rainbow RDCS Referring Phys: [place] Comments: Technically difficult study due to poor echo windows, suboptimal parasternal window, suboptimal apical window and  suboptimal subcostal window. Patient has trach collar. Off aix medial images in apical region. Feeding tube in subcostal region. IMPRESSIONS  1. Left ventricular ejection fraction, by estimation, is 60 to 65%. The left ventricle has normal function. The left ventricle has no regional wall motion abnormalities. There is mild left ventricular hypertrophy. Left ventricular diastolic parameters were normal.  2. Right ventricular systolic function is normal. The right ventricular size is moderately enlarged. There is normal pulmonary artery systolic pressure.  3. The mitral valve is normal in structure. No evidence of mitral valve regurgitation.  4. The aortic valve was not well visualized. Aortic valve regurgitation is not visualized. No aortic stenosis is present.  5. The inferior vena cava is normal in size with greater than 50% respiratory variability, suggesting right atrial pressure of 3 mmHg. Conclusion(s)/Recommendation(s): Techincally difficult study, but no vegetaion seen. If high clinical supicion for endocarditis, consider TEE. FINDINGS  Left Ventricle: Left ventricular ejection fraction, by estimation, is 60 to 65%. The left ventricle has normal function. The left ventricle has no regional wall motion abnormalities. The left ventricular internal cavity size was small. There is mild left ventricular hypertrophy. Left ventricular diastolic parameters were normal. Right Ventricle: The right ventricular size is moderately enlarged. Right vetricular wall thickness was not well visualized. Right ventricular systolic function is normal. There is normal pulmonary artery systolic pressure. The tricuspid regurgitant velocity is 2.78 m/s, and with an assumed right atrial pressure of 3 mmHg, the estimated right ventricular systolic pressure is 66.2 mmHg. Left Atrium: Left atrial size was normal in size. Right Atrium: Right atrial size was normal in size. Pericardium: There is no evidence of pericardial effusion. Mitral  Valve: The mitral valve is normal in structure. No evidence of mitral valve regurgitation. Tricuspid Valve: The tricuspid valve is normal in structure. Tricuspid valve regurgitation is trivial. Aortic Valve: The aortic valve was not well visualized. Aortic valve regurgitation is not visualized. No aortic stenosis is present. Pulmonic Valve: The pulmonic valve was not well visualized. Pulmonic valve regurgitation is not visualized. Aorta: The aortic root is normal in size and structure. Venous: The inferior vena cava is normal in size with greater than 50% respiratory variability, suggesting right atrial pressure of 3 mmHg. IAS/Shunts: The interatrial septum was not well visualized.  LEFT VENTRICLE PLAX 2D LVIDd:         3.60 cm      Diastology LVIDs:         2.30 cm      LV e' medial:    11.20 cm/s LV PW:         1.30 cm      LV E/e' medial:  8.5 LV IVS:        0.90 cm      LV e' lateral:   12.00 cm/s LVOT diam:     2.20 cm      LV E/e' lateral: 7.9 LV SV:         57 LV SV Index:   34 LVOT  Area:     3.80 cm  LV Volumes (MOD) LV vol d, MOD A2C: 66.9 ml LV vol d, MOD A4C: 101.0 ml LV vol s, MOD A2C: 22.7 ml LV vol s, MOD A4C: 24.2 ml LV SV MOD A2C:     44.2 ml LV SV MOD A4C:     101.0 ml LV SV MOD BP:      78.9 ml RIGHT VENTRICLE             IVC RV S prime:     18.20 cm/s  IVC diam: 1.80 cm TAPSE (M-mode): 2.0 cm LEFT ATRIUM           Index       RIGHT ATRIUM           Index LA diam:      3.40 cm 2.00 cm/m  RA Area:     15.10 cm LA Vol (A2C): 13.3 ml 7.83 ml/m  RA Volume:   34.90 ml  20.56 ml/m LA Vol (A4C): 28.1 ml 16.55 ml/m  AORTIC VALVE LVOT Vmax:   126.00 cm/s LVOT Vmean:  71.400 cm/s LVOT VTI:    0.150 m  AORTA Ao Root diam: 3.40 cm Ao Asc diam:  3.70 cm MITRAL VALVE               TRICUSPID VALVE MV Area (PHT): 5.19 cm    TR Peak grad:   30.9 mmHg MV Decel Time: 146 msec    TR Vmax:        278.00 cm/s MV E velocity: 95.14 cm/s MV A velocity: 84.80 cm/s  SHUNTS MV E/A ratio:  1.12        Systemic VTI:   0.15 m                            Systemic Diam: 2.20 cm Steve Milian MD Electronically signed by Steve Milian MD Signature Date/Time: 09/21/2020/6:08:46 PM    Final    VAS [place] LOWER EXTREMITY VENOUS (DVT)  Result Date: 09/25/2020  Lower Venous DVT Study Patient Name:  Steve [name] [name]  Date of Exam:   09/24/2020 Medical Rec #: [id number]          Accession #:    [id number] Date of Birth: 1956/02/23           Patient Gender: M Patient Age:   64Y Exam Location:  [name] Procedure:      VAS [place] LOWER EXTREMITY VENOUS (DVT) Referring Phys: [id number] MURALI RAMASWAMY --------------------------------------------------------------------------------  Indications: Post-Covid severe pulmonary fibrosis, relapsed Hodkin's lymphoma, edema.  Comparison       07-09-2019 Bilateral lower extremity venous was negative for Study:           DVT. Performing Technologist: Darlin Coco RDMS,RVT  Examination Guidelines: A complete evaluation includes B-mode imaging, spectral Doppler, color Doppler, and power Doppler as needed of all accessible portions of each vessel. Bilateral testing is considered an integral part of a complete examination. Limited examinations for reoccurring indications may be performed as noted. The reflux portion of the exam is performed with the patient in reverse Trendelenburg.  +---------+---------------+---------+-----------+----------+--------------+ RIGHT    CompressibilityPhasicitySpontaneityPropertiesThrombus Aging +---------+---------------+---------+-----------+----------+--------------+ CFV      Full           Yes      Yes                                 +---------+---------------+---------+-----------+----------+--------------+  SFJ      Full                                                        +---------+---------------+---------+-----------+----------+--------------+ FV Prox  Full                                                         +---------+---------------+---------+-----------+----------+--------------+ FV Mid   Full                                                        +---------+---------------+---------+-----------+----------+--------------+ FV DistalFull                                                        +---------+---------------+---------+-----------+----------+--------------+ PFV      Full                                                        +---------+---------------+---------+-----------+----------+--------------+ POP      Full           Yes      Yes                                 +---------+---------------+---------+-----------+----------+--------------+ PTV      Full                                                        +---------+---------------+---------+-----------+----------+--------------+ PERO     Full                                                        +---------+---------------+---------+-----------+----------+--------------+   +---------+---------------+---------+-----------+----------+--------------+ LEFT     CompressibilityPhasicitySpontaneityPropertiesThrombus Aging +---------+---------------+---------+-----------+----------+--------------+ CFV      Full           Yes      Yes                                 +---------+---------------+---------+-----------+----------+--------------+ SFJ      Full                                                        +---------+---------------+---------+-----------+----------+--------------+  FV Prox  Full                                                        +---------+---------------+---------+-----------+----------+--------------+ FV Mid   Full                                                        +---------+---------------+---------+-----------+----------+--------------+ FV DistalFull                                                         +---------+---------------+---------+-----------+----------+--------------+ PFV      Full                                                        +---------+---------------+---------+-----------+----------+--------------+ POP      Full           Yes      Yes                                 +---------+---------------+---------+-----------+----------+--------------+ PTV      Full                                                        +---------+---------------+---------+-----------+----------+--------------+ PERO     Full                                                        +---------+---------------+---------+-----------+----------+--------------+     Summary: RIGHT: - There is no evidence of deep vein thrombosis in the lower extremity.  - No cystic structure found in the popliteal fossa.  - Ultrasound characteristics of enlarged lymph nodes are noted in the groin.  LEFT: - There is no evidence of deep vein thrombosis in the lower extremity.  - No cystic structure found in the popliteal fossa.  *See table(s) above for measurements and observations. Electronically signed by Harold Barban MD on 09/25/2020 at 10:25:35 PM.    Final    [place] EKG SITE RITE  Result Date: 10/06/2020 If Site Rite image not attached, placement could not be confirmed due to current cardiac rhythm.  US Abdomen Limited RUQ (LIVER/GB)  Result Date: 09/19/2020 CLINICAL DATA:  Transaminitis. EXAM: ULTRASOUND ABDOMEN LIMITED RIGHT UPPER QUADRANT COMPARISON:  None. FINDINGS: Gallbladder: No gallbladder wall thickening. Small amount of biliary sludge. No sonographic Murphy sign noted by sonographer. Common bile duct: Diameter: 3 mm, normal Liver: No focal lesion identified. Within normal limits in  parenchymal echogenicity. Portal vein is patent on color Doppler imaging with normal direction of blood flow towards the liver. Other: None. IMPRESSION: Biliary sludge without evidence of acute cholecystitis. Electronically  Signed   By: Valentino Saxon MD   On: 09/19/2020 16:18    Lab Data:  CBC: Recent Labs  Lab 10/06/20 0237 10/11/20 0521  WBC 13.1* 7.3  HGB 11.2* 9.9*  HCT 39.1 34.4*  MCV 100.0 101.2*  PLT 223 419*   Basic Metabolic Panel: Recent Labs  Lab 10/06/20 0237 10/07/20 0235 10/08/20 0258 10/09/20 0500 10/10/20 0400 10/11/20 0521 10/12/20 0500  NA 142  144   < > 141 140 142 145 145  K 4.7  4.8   < > 4.4 4.3 4.1 4.3 4.2  CL 92*  91*   < > 89* 88* 90* 93* 94*  CO2 45*  45*   < > 46* >50* 45* 46* 45*  GLUCOSE 86  85   < > 99 134* 138* 135* 161*  BUN 27*  27*   < > 28* 23 32* 36* 36*  CREATININE 0.52*  0.59*   < > 0.49* 0.43* 0.64 0.58* 0.54*  CALCIUM 10.5*  10.5*   < > 10.1 10.2 10.7* 10.8* 10.4*  MG 2.4  --   --   --   --   --   --   PHOS 4.2  4.2   < > 3.4 3.7 4.8* 4.2 2.9   < > = values in this interval not displayed.   GFR: Estimated Creatinine Clearance: 80.1 mL/min (A) (by C-G formula based on SCr of 0.54 mg/dL (L)). Liver Function Tests: Recent Labs  Lab 10/08/20 0258 10/09/20 0500 10/10/20 0400 10/11/20 0521 10/12/20 0500  ALBUMIN 2.7* 2.8* 2.8* 2.8* 2.8*   No results for input(s): LIPASE, AMYLASE in the last 168 hours. No results for input(s): AMMONIA in the last 168 hours. Coagulation Profile: No results for input(s): INR, PROTIME in the last 168 hours.  Cardiac Enzymes: No results for input(s): CKTOTAL, CKMB, CKMBINDEX, TROPONINI in the last 168 hours. BNP (last 3 results) No results for input(s): PROBNP in the last 8760 hours. HbA1C: No results for input(s): HGBA1C in the last 72 hours. CBG: Recent Labs  Lab 10/11/20 1550 10/11/20 2013 10/11/20 2306 10/12/20 0447 10/12/20 0753  GLUCAP 172* 96 140* 144* 139*   Lipid Profile: No results for input(s): CHOL, HDL, LDLCALC, TRIG, CHOLHDL, LDLDIRECT in the last 72 hours. Thyroid Function Tests: No results for input(s): TSH, T4TOTAL, FREET4, T3FREE, THYROIDAB in the last 72 hours. Anemia  Panel: No results for input(s): VITAMINB12, FOLATE, FERRITIN, TIBC, IRON, RETICCTPCT in the last 72 hours. Urine analysis:    Component Value Date/Time   COLORURINE YELLOW 09/19/2020 1511   APPEARANCEUR HAZY (A) 09/19/2020 1511   LABSPEC 1.012 09/19/2020 1511   PHURINE 7.0 09/19/2020 1511   GLUCOSEU NEGATIVE 09/19/2020 1511   HGBUR NEGATIVE 09/19/2020 1511   HGBUR negative 03/23/2007 0000   BILIRUBINUR NEGATIVE 09/19/2020 1511   BILIRUBINUR neg 11/11/2016 1017   KETONESUR NEGATIVE 09/19/2020 1511   PROTEINUR NEGATIVE 09/19/2020 1511   UROBILINOGEN 0.2 11/11/2016 1017   UROBILINOGEN 0.2 11/08/2012 0915   NITRITE NEGATIVE 09/19/2020 1511   LEUKOCYTESUR NEGATIVE 09/19/2020 1511     Orvil Faraone M.D. [facility] 10/12/2020, 11:54 AM  Available via Epic secure chat 7am-7pm After 7 pm, please refer to night coverage provider listed on amion.

## 2020-10-13 DIAGNOSIS — J9611 Chronic respiratory failure with hypoxia: Secondary | ICD-10-CM | POA: Diagnosis not present

## 2020-10-13 DIAGNOSIS — R7881 Bacteremia: Secondary | ICD-10-CM | POA: Diagnosis not present

## 2020-10-13 DIAGNOSIS — R627 Adult failure to thrive: Secondary | ICD-10-CM | POA: Diagnosis not present

## 2020-10-13 DIAGNOSIS — G620 Drug-induced polyneuropathy: Secondary | ICD-10-CM | POA: Diagnosis not present

## 2020-10-13 LAB — RENAL FUNCTION PANEL
Albumin: 2.9 g/dL — ABNORMAL LOW (ref 3.5–5.0)
Anion gap: 7 (ref 5–15)
BUN: 32 mg/dL — ABNORMAL HIGH (ref 8–23)
CO2: 44 mmol/L — ABNORMAL HIGH (ref 22–32)
Calcium: 10.8 mg/dL — ABNORMAL HIGH (ref 8.9–10.3)
Chloride: 93 mmol/L — ABNORMAL LOW (ref 98–111)
Creatinine, Ser: 0.53 mg/dL — ABNORMAL LOW (ref 0.61–1.24)
GFR, Estimated: >60 mL/min (ref >60)
Glucose, Bld: 123 mg/dL — ABNORMAL HIGH (ref 70–99)
Phosphorus: 3 mg/dL (ref 2.5–4.6)
Potassium: 4.1 mmol/L (ref 3.5–5.1)
Sodium: 144 mmol/L (ref 135–145)

## 2020-10-13 LAB — GLUCOSE, CAPILLARY
Glucose-Capillary: 130 mg/dL — ABNORMAL HIGH (ref 70–99)
Glucose-Capillary: 123 mg/dL — ABNORMAL HIGH (ref 70–99)
Glucose-Capillary: 138 mg/dL — ABNORMAL HIGH (ref 70–99)
Glucose-Capillary: 132 mg/dL — ABNORMAL HIGH (ref 70–99)
Glucose-Capillary: 123 mg/dL — ABNORMAL HIGH (ref 70–99)
Glucose-Capillary: 138 mg/dL — ABNORMAL HIGH (ref 70–99)

## 2020-10-13 MED ORDER — OSMOLITE 1.2 CAL PO LIQD
1000.0000 mL | ORAL | Status: DC
  Administered 2020-10-13 – 2020-10-26 (×14): 1000 mL

## 2020-10-13 MED ORDER — FREE WATER
100.0000 mL | Freq: Four times a day (QID) | Status: DC
  Administered 2020-10-13 – 2020-10-26 (×53): 100 mL

## 2020-10-13 MED ORDER — PROSOURCE TF PO LIQD
45.0000 mL | Freq: Every day | ORAL | Status: DC
  Administered 2020-10-13 – 2020-10-26 (×14): 45 mL
  Filled 2020-10-13 (×14): qty 45

## 2020-10-13 NOTE — Progress Notes (Signed)
Nutrition Follow-up  DOCUMENTATION CODES:   Severe malnutrition in context of chronic illness  INTERVENTION:  - will adjust TF regimen: Osmolite 1.2 @ 80 ml/hr x18 hours/day (1500-0900) with 45 ml Prosource TF once/day and 100 ml free water QID to provide 1768 kcal (91% kcal need), 91 grams protein (96% protein need), and 1581 ml free water.  - continue to encourage PO intakes.    NUTRITION DIAGNOSIS:   Severe Malnutrition related to chronic illness, cancer and cancer related treatments as evidenced by severe fat depletion, severe muscle depletion. 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  Patient was discharged from this hospital on 08/25/2020.  Patient was said to have done very well for the first 3 weeks.  Over the last 5 days, patient has been failing to thrive, with associated poor appetite and malaise.  Patient laying in bed, awake with no family or visitors present. He did not feel up to eating breakfast this AM. He ate sausage for breakfast yesterday and ate very minimally throughout the day otherwise.   He has PEG in place and is receiving Vital AF 1.2 @ 65 ml/hr with 100 ml free water QID. This regimen is providing 1872 kcal (96% kcal need), 117 grams protein, and 1665 ml free water.   Talked with patient about adjusting TF regimen as outlined above which he is very open to and interested in in order to encourage increased PO intake. He feels that with this change he will be able to eat more/day.   He has not been  weighed since 6/22. No edema present at this time.    Labs reviewed; CBGs: 130, 123, 138 mg/dl, Cl: 93 mmol/l, BUN: 32 mg/dl, creatinine: 0.53 mg/dl, Ca: 10.8 mg/dl. Medications reviewed; 10000 units cholecalciferol/day, sliding scale novolog, 10 units lantus/day, 15 ml multivitamin/day, 40 mg protonix/day.   Diet Order:   Diet Order             Diet Carb Modified Fluid consistency: Thin; Room service appropriate? Yes  Diet effective now                   EDUCATION NEEDS:   Education needs have been addressed  Skin:  Skin Assessment: Skin Integrity Issues: Skin Integrity Issues:: Stage I, Other (Comment) Stage I: sacrum (6/19) Other: MASD to mid-buttocks (newly documented today)  Last BM:  7/4 (type 5 x1)  Height:   Ht Readings from Last 1 Encounters:  09/26/20 5' 5" (1.651 m)    Weight:   Wt Readings from Last 1 Encounters:  09/30/20 69 kg      Estimated Nutritional Needs:  Kcal:  1950-2150 Protein:  95-110g Fluid:  2L/day       Jarome Matin, MS, RD, LDN, CNSC Inpatient Clinical Dietitian RD pager # available in [place]  After hours/weekend pager # available in [facility] [place]

## 2020-10-13 NOTE — Progress Notes (Signed)
[facility]                                                                              Patient Demographics  Steve [name], is a 65 y.o. male, DOB - 1955/11/11, SVX:[id number]  Admit date - 09/19/2020   Admitting Physician Bonnell Public, MD  Outpatient Primary MD for the patient is Saguier, [name]  Outpatient specialists:   LOS - 24  days   Medical records reviewed and are as summarized below:    Chief Complaint  Patient presents with   Hyperkalemia       Brief summary   Briefly, 65 years old male with PMH significant for [id number] infection complicated by ARDS, pulmonary fibrosis / pneumonitis and chronic respiratory failure on 8 L via trach, PEG dependence, Hodgkin's lymphoma on chemo, type 2 diabetes, hypertension and GERD presented in the ED with generalized malaise, poor appetite and failure to thrive and was admitted for possible pneumonia and found to have a staph epidermidis bacteremia.  First negative blood cultures 09/22/20.  0n 09/24/20, patient developed respiratory distress with increased oxygen requirement and was transferred to ICU and started on pressure support ventilation.  Critical care and ID consulted.   ID recommended TEE to rule out infective endocarditis, however not stable to have TEE at this time per cardiology.    ID recommended 6 weeks of IV antibiotics from first negative blood cultures on 09/22/20, if unable to have TEE.  Cardiology was reconsulted to reassess if patient is stable for TEE on 6/28 however still having descending episodes and unstable for TEE..     Assessment & Plan    Principal Problem: Sepsis due to pneumonia, staph epidermidis bacteremia: Present on admission -Met sepsis criteria on presentation with tachycardia, tachypnea, leukocytosis, chest x-ray with possible recurrent pneumonia -CT chest with interval increase in bulk of mediastinal and axillary adenopathy. -Blood cultures grew staph  epidermidis, Port-A-Cath was removed on 6/16. -Patient remained on cefepime from 6/11-6/15.  ID recommended TEE to exclude endocarditis, however patient has been unstable for TEE -Continue midodrine for hypotension, stress dose steroids tapered off on 6/24 -ID recommended vancomycin for minimum 6 weeks from date of negative culture on 6/14, if TEE not done. 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  -Per patient's wife, they do not have vent since in December 2021, will need mechanical vent for intermittent use at home or qhs PRN.  Appreciate CCM following. -TOC consult placed for Vent arrangement at home  Hypotension BP currently stable, continue midodrine 10 mg 3 times daily  Chronic metabolic alkalosis -Received Diamox 500 mg p.o. x1 on 7/1 for bicarb > 50 -Bicarb 44, continue to monitor   Relapsed Hodgkin lymphoma -CT chest concerning for worsening mediastinal and axillary lymphadenopathy -Oncology and  palliative medicine following -Seen by Dr. Marin Olp on 6/20, PICC line has been placed  Diabetes mellitus type 2, NIDDM with hyperglycemia, uncontrolled -CBG stable, continue current insulin regimen Levemir 10 units qhs, sensitive sliding scale insulin  Normocytic anemia, anemia of chronic disease -H&H stable, hemoglobin 9.9 on 7/20  Hyponatremia, hypokalemia -Stable, no acute issues  Pressure injury -Sacrum mid stage I -Wound care per RN  Severe malnutrition secondary to chronic illness, malignancy -Continue tube feeds -  Estimated body mass index is 25.31 kg/m as calculated from  the following:   Height as of this encounter: 5' 5"  (1.651 m).   Weight as of this encounter: 69 kg.  History of depression, anxiety Increase to Zoloft 165m daily   Code Status: full  DVT Prophylaxis:  enoxaparin (LOVENOX) injection 40 mg Start: 09/19/20 2200   Level of Care: Level of care: ICU Family Communication: Discussed all imaging results, lab results, explained patient's wife on phone [phone]   Disposition Plan:     Status is: Inpatient  Remains inpatient appropriate because:Inpatient level of care appropriate due to severity of illness  Dispo: The patient is from: Home              Anticipated d/c is to: Home              Patient currently is not medically stable to d/c.  Once respiratory status is close to his baseline, needs home vent arrangement.     Difficult to place patient No  Time Spent in minutes  26ms   Procedures:  Mech vent   Consultants:   Pulmonary critical care Oncology ID Palliative medicine    Antimicrobials:   Anti-infectives (From admission, onward)    Start     Dose/Rate Route Frequency Ordered Stop   09/23/20 0800  ceFEPIme (MAXIPIME) 2 g in sodium chloride 0.9 % 100 mL IVPB  Status:  Discontinued        2 g 200 mL/hr over 30 Minutes Intravenous Every 8 hours 09/23/20 0738 09/23/20 1758   09/21/20 0400  vancomycin (VANCOREADY) IVPB 750 mg/150 mL        750 mg 150 mL/hr over 60 Minutes Intravenous Every 12 hours 09/20/20 1715 11/04/20 0559   09/20/20 1730  vancomycin (VANCOREADY) IVPB 750 mg/150 mL        750 mg 150 mL/hr over 60 Minutes Intravenous STAT 09/20/20 1714 09/20/20 1954   09/20/20 0400  vancomycin (VANCOREADY) IVPB 750 mg/150 mL  Status:  Discontinued        750 mg 150 mL/hr over 60 Minutes Intravenous Every 12 hours 09/19/20 1932 09/20/20 1155   09/20/20 0000  ceFEPIme (MAXIPIME) 2 g in sodium chloride 0.9 % 100 mL IVPB  Status:  Discontinued        2 g 200 mL/hr over 30 Minutes Intravenous Every 8 hours  09/19/20 1932 09/23/20 0721   09/19/20 1600  ceFEPIme (MAXIPIME) 2 g in sodium chloride 0.9 % 100 mL IVPB        2 g 200 mL/hr over 30 Minutes Intravenous  Once 09/19/20 1548 09/19/20 1634   09/19/20 1600  vancomycin (VANCOREADY) IVPB 1250 mg/250 mL        1,250 mg 166.7 mL/hr over 90 Minutes Intravenous STAT 09/19/20 1557 09/19/20 1822          Medications  Scheduled Meds:  chlorhexidine gluconate (MEDLINE KIT)  15 mL Mouth Rinse BID   Chlorhexidine Gluconate Cloth  6 each Topical Q0600   cholecalciferol  10,000 Units Oral Daily   enoxaparin (LOVENOX) injection  40 mg Subcutaneous Q24H   free water  100 mL Per Tube Q6H   insulin aspart  0-9 Units Subcutaneous Q4H   insulin glargine  10 Units Subcutaneous QHS   mouth rinse  15 mL Mouth Rinse BID   midodrine  10 mg Per Tube TID WC   multivitamin  15 mL Per Tube Daily   pantoprazole sodium  40 mg Per Tube Daily   sertraline  100 mg Per Tube Daily   sodium chloride flush  10-40 mL Intracatheter Q12H   traZODone  100 mg Per Tube QHS   Continuous Infusions:  sodium chloride 20 mL/hr at 10/04/20 1343   feeding supplement (VITAL AF 1.2 CAL) 1,000 mL (10/12/20 1039)   vancomycin Stopped (10/13/20 0611)   PRN Meds:.albuterol, guaiFENesin, hyoscyamine, LORazepam, sodium chloride flush      Subjective:   Steve [name] was seen and examined today.  No acute issues overnight, did not need vent last PM.  However he has noted intermittently and at night.   No acute chest pain, shortness of breath.  No fevers or chills. Tolerating tube feeds  Objective:   Vitals:   10/13/20 0700 10/13/20 0737 10/13/20 0800 10/13/20 0900  BP: (!) 84/48  (!) 87/40 (!) 89/45  Pulse: 63  63 62  Resp:      Temp:   98.2 F (36.8 C)   TempSrc:   Oral   SpO2: 100% 99% 97% 93%  Weight:      Height:        Intake/Output Summary (Last 24 hours) at 10/13/2020 1123 Last data filed at 10/13/2020 0569 Gross per 24 hour  Intake 5098.67 ml  Output  1950 ml  Net 3148.67 ml     Wt Readings from Last 3 Encounters:  09/30/20 69 kg  09/18/20 64.4 kg  09/08/20 64.4 kg   Physical Exam General: Alert and oriented, NAD, on trach, chronically ill-appearing Cardiovascular: S1 S2 clear, RRR. No pedal edema b/l Respiratory: Bibasilar crackles Gastrointestinal: Soft, nontender, nondistended, NBS, PEG tube Ext: no pedal edema bilaterally Neuro: no new deficits Psych: Normal affect and demeanor, alert and oriented x3    Data Reviewed:  I have personally reviewed following labs and imaging studies  Micro Results No results found for this or any previous visit (from the past 240 hour(s)).  Radiology Reports DG Chest 2 View  Result Date: 09/18/2020 CLINICAL DATA:  Acute on chronic respiratory failure. EXAM: CHEST - 2 VIEW COMPARISON:  Aug 15, 2020. FINDINGS: Stable cardiomegaly. Tracheostomy tube is in good position. Left-sided Port-A-Cath is unchanged. Stable bilateral lung opacities are noted concerning for multifocal pneumonia or chronic scarring. No pneumothorax or pleural effusion is noted. Bony thorax is unremarkable. IMPRESSION: Stable bilateral lung opacities as described above. Electronically Signed   By: Marijo Conception M.D.   On: 09/18/2020 12:59   CT CHEST WO CONTRAST  Result Date: 09/20/2020 CLINICAL DATA:  Respiratory failure.  History of Hodgkin's lymphoma. EXAM: CT CHEST WITHOUT CONTRAST TECHNIQUE: Multidetector CT imaging of the chest was performed following the standard protocol without IV contrast. COMPARISON:  July 21, 2020 FINDINGS: Cardiovascular: Heart is mildly enlarged. LEFT chest port tip terminates at the superior cavoatrial junction. No pericardial effusion. Minimal atherosclerotic calcifications. Enlarged pulmonary artery as can be seen in pulmonary arterial hypertension. Mediastinum/Nodes: Revisualization of extensive adenopathy. LEFT level 3 axillary lymph node measures 11 mm, previously 3 mm (series 2, image 42).  LEFT supraclavicular  lymph node measures 15 mm, previously 15 mm (series 2, image 24). RIGHT level 1 axillary lymph node measures 8 mm, previously 4 mm (series 2, image 63). RIGHT internal mammary lymph node measures 31 mm, previously 26 mm (series 2, image 77). RIGHT paratracheal conglomeration spanned 59 x 43 mm, previously 44 by 30 mm (series 2, image 45). There is a lunate configuration of the trachea. Lungs/Pleura: Visualization of a peripheral rind of fluid adjacent to the RIGHT lung, similar in comparison to prior. Extensive bilateral ground-glass opacities with subpleural reticulation and suggestion of honeycombing. Peribronchovascular nodularity is similar in comparison to prior. Similar appearance of RIGHT middle lobe atelectasis. Upper Abdomen: No acute abnormality. Musculoskeletal: Known osseous metastatic disease is not well visualized. Limited evaluation of the ribs secondary to respiratory motion. No acute thoracic spine fracture. IMPRESSION: 1. Interval increase in bulky adenopathy within the mediastinum consistent with known lymphoma. Increased axillary lymph node size in comparison to prior. 2. Revisualization of sequela of advanced pulmonary fibrosis. Unchanged appearance of peribronchovascular nodularity. This could reflect lymphangitic spread of lymphoma versus underlying infection. 3. Unchanged small rind of RIGHT-sided pleural fluid. Underlying pleural nodularity from lymphoma remains in the differential. 4. 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 5. Enlarged pulmonary artery as can be seen in pulmonary arterial hypertension. Electronically Signed   By: Valentino Saxon MD   On: 09/20/2020 17:25   CT Angio Chest Pulmonary Embolism (PE) W or WO Contrast  Result Date: 09/25/2020 CLINICAL DATA:  Cough and hypoxia.  COVID-19. EXAM: CT ANGIOGRAPHY CHEST WITH CONTRAST TECHNIQUE: Multidetector CT imaging of the chest was performed using the  standard protocol during bolus administration of intravenous contrast. Multiplanar CT image reconstructions and MIPs were obtained to evaluate the vascular anatomy. CONTRAST:  33m OMNIPAQUE IOHEXOL 350 MG/ML SOLN COMPARISON:  09/20/2020 FINDINGS: Cardiovascular: Contrast injection is sufficient to demonstrate satisfactory opacification of the pulmonary arteries to the segmental level. There is no pulmonary embolus or evidence of right heart strain. The size of the main pulmonary artery is normal. Heart size is normal, with no pericardial effusion. The course and caliber of the aorta are normal. There is no atherosclerotic calcification. No acute aortic syndrome. Mediastinum/Nodes: Bulky mediastinal adenopathy is unchanged since the recent study of 09/20/2020. Lungs/Pleura: Tracheostomy tube tip is at the level of the clavicular heads. There is increased pleural fluid on the right. Diffuse pleural thickening is unchanged. Unchanged appearance of diffuse parenchymal interstitial abnormality. Upper Abdomen: Contrast bolus timing is not optimized for evaluation of the abdominal organs. The visualized portions of the organs of the upper abdomen are normal. Musculoskeletal: No chest wall abnormality. No bony spinal canal stenosis. Review of the MIP images confirms the above findings. IMPRESSION: 1. No pulmonary embolus or acute aortic syndrome. 2. Bulky mediastinal adenopathy and diffuse pleural, interstitial and parenchymal pulmonary abnormalities are unchanged since 09/20/2020. Electronically Signed   By: KUlyses JarredM.D.   On: 09/25/2020 02:54   IR REMOVAL TUN ACCESS W/ PORT W/O FL MOD SED  Result Date: 09/22/2020 INDICATION: 65year old male with a history of Hodgkin lymphoma currently presenting with bacteremia associated with pneumonia. He has a left chest port catheter which was placed by interventional radiology (Dr. SAnnamaria Boots in December of 2020. he presents for port catheter removal to help facilitate  clearance of bacteremia. EXAM: REMOVAL RIGHT IJ VEIN PORT-A-CATH MEDICATIONS: None ANESTHESIA/SEDATION: Moderate (conscious) sedation was employed during this procedure. A total of Versed 1 mg and Fentanyl 50 mcg was administered intravenously.  Moderate Sedation Time: 12 minutes. The patient's level of consciousness and vital signs were monitored continuously by radiology nursing throughout the procedure under my direct supervision. FLUOROSCOPY TIME:  None. COMPLICATIONS: None immediate. PROCEDURE: Informed written consent was obtained from the patient after a thorough discussion of the procedural risks, benefits and alternatives. All questions were addressed. Maximal Sterile Barrier Technique was utilized including caps, mask, sterile gowns, sterile gloves, sterile drape, hand hygiene and skin antiseptic. A timeout was performed prior to the initiation of the procedure. The right chest was prepped and draped in a sterile fashion. Lidocaine was utilized for local anesthesia. An incision was made over the previously healed surgical incision. Utilizing blunt dissection, the port catheter and reservoir were removed from the underlying subcutaneous tissue in their entirety. Securing sutures were also removed. The pocket was irrigated with a copious amount of sterile normal saline. The wound was examined. No evidence of purulence. The pocket was closed with interrupted 3-0 Vicryl stitches. The subcutaneous tissue was closed with 3-0 Vicryl interrupted subcutaneous stitches. Dermabond was applied. IMPRESSION: Successful right IJ vein Port-A-Cath explant. Electronically Signed   By: Jacqulynn Cadet M.D.   On: 09/22/2020 16:18   DG CHEST PORT 1 VIEW  Result Date: 09/24/2020 CLINICAL DATA:  Respiratory distress EXAM: PORTABLE CHEST 1 VIEW COMPARISON:  09/19/2020 FINDINGS: Lung volumes are small with elevation of the right hemidiaphragm, stable since prior examination. Superimposed multifocal pulmonary infiltrates  likely related to chronic underlying interstitial lung disease appears stable, better assessed on CT examination of 09/20/2020. Superimposed right pleural thickening is again noted. Tracheostomy unchanged. Cardiac size is enlarged with associated superior mediastinal widening again noted related to extensive mediastinal and pericardial adenopathy better demonstrated on prior CT examination. No acute bone abnormality. IMPRESSION: Stable diffuse pulmonary infiltrates likely related to chronic underlying interstitial lung disease and asymmetric right pleural thickening. Stable enlargement of the cardiomediastinal silhouette related to extensive mediastinal and pericardial adenopathy better appreciated on prior CT examination. Electronically Signed   By: Fidela Salisbury MD   On: 09/24/2020 11:41   DG Chest Port 1 View  Result Date: 09/19/2020 CLINICAL DATA:  Shortness of breath. EXAM: PORTABLE CHEST 1 VIEW COMPARISON:  Chest x-ray dated September 18, 2020. FINDINGS: Unchanged tracheostomy tube and left chest wall port catheter. Stable cardiomegaly and mediastinal widening related to underlying lymphadenopathy. Unchanged diffuse patchy nodular opacities throughout both lungs superimposed on chronic pulmonary fibrosis. Unchanged small right pleural effusion. No pneumothorax. No acute osseous abnormality. IMPRESSION: 1. Unchanged multifocal pneumonia superimposed on chronic severe pulmonary fibrosis. 2. Unchanged mediastinal lymphadenopathy. Electronically Signed   By: Titus Dubin M.D.   On: 09/19/2020 15:39   DG Swallowing Func-Speech Pathology  Result Date: 09/20/2020 Formatting of this result is different from the original. Objective Swallowing Evaluation: Type of Study: MBS-Modified Barium Swallow Study  Patient Details Name: BRANDT CHANEY MRN: [id number] Date of Birth: 04-25-1955 Today's Date: 09/20/2020 Time: SLP Start Time (ACUTE ONLY): 0903 -SLP Stop Time (ACUTE ONLY): 4656 SLP Time Calculation (min) (ACUTE  ONLY): 20 min Past Medical History: Past Medical History: Diagnosis Date  Abscess of muscle 08/10/2011  staph infection of right hip   Acute on chronic respiratory failure with hypoxia ([name])   Acute respiratory distress syndrome (ARDS) due to COVID-19 virus ([place])   Anxiety   Diabetes mellitus without complication ([name])   GERD (gastroesophageal reflux disease)   rare reflux - no meds for reflux - NO PROBLEM IN PAST SEVERAL YRS  HCAP (healthcare-associated pneumonia)   Hip  dysplasia, congenital   no surgery as a child for hip dysplasia - has had bilateral hip replacements as an adult  Hodgkin lymphoma ([place])   Hypertension   Pancreatitis   Pneumothorax, acute   Postoperative anemia due to acute blood loss 09/07/2012  Septic arthritis of hip ([place]) 09/05/2012  PT'S TOTAL HIP JOINT REMOVED - ANTIBIOTIC SPACE PLACED AND PT HAS FINISHED IV ANTIBIOTICS ( PICC LINE REMOVED)  Sleep apnea   USES CPAP Past Surgical History: Past Surgical History: Procedure Laterality Date  COLONOSCOPY  04/26/2007  HERNIA REPAIR    inguinal hernia x3  IR GASTROSTOMY TUBE MOD SED  08/22/2019  IR IMAGING GUIDED PORT INSERTION  03/29/2019  JOINT REPLACEMENT  2002 & 2007  bilateral hip replacement  LYMPH NODE BIOPSY Right 03/20/2019  Procedure: DEEP RIGHT SUPRACLAVICULAR LYMPH NODE EXCISION;  Surgeon: Fanny Skates, MD;  Location: [facility];  Service: General;  Laterality: Right;  MULTIPLE EXTRACTIONS WITH ALVEOLOPLASTY  07/28/2011  Procedure: MULTIPLE EXTRACION WITH ALVEOLOPLASTY;  Surgeon: Lenn Cal, DDS;  Location: [facility];  Service: Oral Surgery;  Laterality: N/A;  Extraction of tooth #'s 2,3,4,5,6,11,12,13,15,19,22 with alveoloplasty.  shoulder repair - right for separation of shoulder    TEE WITHOUT CARDIOVERSION  07/29/2011  Procedure: TRANSESOPHAGEAL ECHOCARDIOGRAM (TEE);  Surgeon: Josue Hector, MD;  Location: [place];  Service: Cardiovascular;  Laterality: N/A;  TOTAL HIP REVISION Right 09/05/2012  Procedure: RIGHT HIP  RESECTION ARTHROPLASTY WITH ANTIBIOTIC SPACERS;  Surgeon: [name], MD;  Location: [facility];  Service: Orthopedics;  Laterality: Right;  TOTAL HIP REVISION Right 11/30/2012  Procedure: RIGHT TOTAL HIP ARTHROPLASTY REIMPLANTATION;  Surgeon: [name], MD;  Location: [facility];  Service: Orthopedics;  Laterality: Right; HPI: Patient is a 65 year old Caucasian  male with medical history significant of chronic tracheostomy after prolonged hospitalization in 2021 related with pneumonia due to [id number] complicated by ARDS and pulmonary fibrosis +/-pneumonitis from brentuximab, s/p PEG, HTN, DM type II, Hodgkin's lymphoma on chemotherapy and GERD.  Patient was discharged from this hospital on 08/25/2020.  Patient was said to have done very well for the first 3 weeks.  Over the last 5 days, patient has been failing to thrive, with associated poor appetite and malaise.  Readmitted with unchanged multifocal PNA.  Subjective: pt alert, eager to go home Assessment / Plan / Recommendation CHL IP CLINICAL IMPRESSIONS 09/20/2020 Clinical Impression Patient presents with excellent oropharyngeal swallowing function. Oral phase timely and with full clearance of bolus. Pharyngeally, strength has improved from previous MBS 03/2020 in that patient has no pharyngeal residue post swallow. Intermittent trace penetration of thin liquids noted, primarily via straw, due to mildly decreased laryngeal closure noted, remaining above the vocal cords and clearing with either subsequent dry swallow or cued throat clear. Tested all consistencies both with and without PMSV in place with no notable difference in swallowing function. Educated patient and spouse (via phone per patient request) on results of testing and recommendations which include use of PMV if able (not necessary) to faciliate improved strength of cough/throat clear if needed. Both verbalized understanding. SLP will f/u briefly for tolerance and continued education as needed. Do  not suspect that dysphagia is resulting in recurrent PNAs. Cannot however r/o contribution of PEG tube feedings on aspiration, particularly because patient is also taking pos by mouth in conjunction with tube feeds, and complains of feeling full often. Recommend dietary consult to ensure that patient is being fed via tube the correct amount and in the correct  manner based on how much he consuming by mouth. SLP Visit Diagnosis Dysphagia, pharyngeal phase (R13.13) Attention and concentration deficit following -- Frontal lobe and executive function deficit following -- Impact on safety and function Mild aspiration risk   CHL IP TREATMENT RECOMMENDATION 09/20/2020 Treatment Recommendations Therapy as outlined in treatment plan below   Prognosis 08/23/2020 Prognosis for Safe Diet Advancement Good Barriers to Reach Goals -- Barriers/Prognosis Comment -- CHL IP DIET RECOMMENDATION 09/20/2020 SLP Diet Recommendations Regular solids;Thin liquid Liquid Administration via Cup;Straw Medication Administration Whole meds with liquid Compensations Slow rate;Small sips/bites Postural Changes Remain semi-upright after after feeds/meals (Comment);Seated upright at 90 degrees   CHL IP OTHER RECOMMENDATIONS 09/20/2020 Recommended Consults Other (Comment) Oral Care Recommendations Oral care BID Other Recommendations --   CHL IP FOLLOW UP RECOMMENDATIONS 09/20/2020 Follow up Recommendations None   CHL IP FREQUENCY AND DURATION 09/20/2020 Speech Therapy Frequency (ACUTE ONLY) min 1 x/week Treatment Duration 1 week      CHL IP ORAL PHASE 09/20/2020 Oral Phase WFL Oral - Pudding Teaspoon -- Oral - Pudding Cup -- Oral - Honey Teaspoon -- Oral - Honey Cup -- Oral - Nectar Teaspoon -- Oral - Nectar Cup -- Oral - Nectar Straw -- Oral - Thin Teaspoon -- Oral - Thin Cup -- Oral - Thin Straw -- Oral - Puree -- Oral - Mech Soft -- Oral - Regular -- Oral - Multi-Consistency -- Oral - Pill -- Oral Phase - Comment --  CHL IP PHARYNGEAL PHASE 09/20/2020  Pharyngeal Phase Impaired Pharyngeal- Pudding Teaspoon -- Pharyngeal -- Pharyngeal- Pudding Cup -- Pharyngeal -- Pharyngeal- Honey Teaspoon -- Pharyngeal -- Pharyngeal- Honey Cup -- Pharyngeal -- Pharyngeal- Nectar Teaspoon -- Pharyngeal -- Pharyngeal- Nectar Cup -- Pharyngeal -- Pharyngeal- Nectar Straw -- Pharyngeal -- Pharyngeal- Thin Teaspoon NT Pharyngeal -- Pharyngeal- Thin Cup WFL Pharyngeal -- Pharyngeal- Thin Straw Reduced laryngeal elevation;Penetration/Aspiration during swallow Pharyngeal Material enters airway, remains ABOVE vocal cords and not ejected out Pharyngeal- Puree WFL Pharyngeal -- Pharyngeal- Mechanical Soft WFL Pharyngeal -- Pharyngeal- Regular -- Pharyngeal -- Pharyngeal- Multi-consistency -- Pharyngeal -- Pharyngeal- Pill WFL Pharyngeal -- Pharyngeal Comment --  CHL IP CERVICAL ESOPHAGEAL PHASE 09/20/2020 Cervical Esophageal Phase WFL Pudding Teaspoon -- Pudding Cup -- Honey Teaspoon -- Honey Cup -- Nectar Teaspoon -- Nectar Cup -- Nectar Straw -- Thin Teaspoon -- Thin Cup -- Thin Straw -- Puree -- Mechanical Soft -- Regular -- Multi-consistency -- Pill -- Cervical Esophageal Comment -- McCoy Leah Meryl 09/20/2020, 9:32 AM              ECHOCARDIOGRAM COMPLETE  Result Date: 09/21/2020    ECHOCARDIOGRAM REPORT   Patient Name:   KARANDEEP RESENDE [facility] Date of Exam: 09/21/2020 Medical Rec #:  [id number]         Height:       65.0 in Accession #:    [id number]        Weight:       139.6 lb Date of Birth:  1955-12-21          BSA:          1.698 m Patient Age:    89 years          BP:           103/66 mmHg Patient Gender: M                 HR:           95 bpm. Exam Location:  Inpatient Procedure: 2D Echo,  3D Echo, Cardiac Doppler and Color Doppler Indications:    Bacteremia  History:        Patient has prior history of Echocardiogram examinations, most                 recent 07/09/2019. Abnormal ECG, Arrythmias:Tachycardia,                 Signs/Symptoms:Bacteremia, Shortness of Breath and Dyspnea;  Risk                 Factors:Sleep Apnea, Hypertension and Diabetes. Cancer. Right                 pneumothorax. Subcutaneous air. Hypoxia.  Sonographer:    Roseanna Rainbow RDCS Referring Phys: Crocker Comments: Technically difficult study due to poor echo windows, suboptimal parasternal window, suboptimal apical window and suboptimal subcostal window. Patient has trach collar. Off aix medial images in apical region. Feeding tube in subcostal region. IMPRESSIONS  1. Left ventricular ejection fraction, by estimation, is 60 to 65%. The left ventricle has normal function. The left ventricle has no regional wall motion abnormalities. There is mild left ventricular hypertrophy. Left ventricular diastolic parameters were normal.  2. Right ventricular systolic function is normal. The right ventricular size is moderately enlarged. There is normal pulmonary artery systolic pressure.  3. The mitral valve is normal in structure. No evidence of mitral valve regurgitation.  4. The aortic valve was not well visualized. Aortic valve regurgitation is not visualized. No aortic stenosis is present.  5. The inferior vena cava is normal in size with greater than 50% respiratory variability, suggesting right atrial pressure of 3 mmHg. Conclusion(s)/Recommendation(s): Techincally difficult study, but no vegetaion seen. If high clinical supicion for endocarditis, consider TEE. FINDINGS  Left Ventricle: Left ventricular ejection fraction, by estimation, is 60 to 65%. The left ventricle has normal function. The left ventricle has no regional wall motion abnormalities. The left ventricular internal cavity size was small. There is mild left ventricular hypertrophy. Left ventricular diastolic parameters were normal. Right Ventricle: The right ventricular size is moderately enlarged. Right vetricular wall thickness was not well visualized. Right ventricular systolic function is normal. There is normal pulmonary artery  systolic pressure. The tricuspid regurgitant velocity is 2.78 m/s, and with an assumed right atrial pressure of 3 mmHg, the estimated right ventricular systolic pressure is 63.8 mmHg. Left Atrium: Left atrial size was normal in size. Right Atrium: Right atrial size was normal in size. Pericardium: There is no evidence of pericardial effusion. Mitral Valve: The mitral valve is normal in structure. No evidence of mitral valve regurgitation. Tricuspid Valve: The tricuspid valve is normal in structure. Tricuspid valve regurgitation is trivial. Aortic Valve: The aortic valve was not well visualized. Aortic valve regurgitation is not visualized. No aortic stenosis is present. Pulmonic Valve: The pulmonic valve was not well visualized. Pulmonic valve regurgitation is not visualized. Aorta: The aortic root is normal in size and structure. Venous: The inferior vena cava is normal in size with greater than 50% respiratory variability, suggesting right atrial pressure of 3 mmHg. IAS/Shunts: The interatrial septum was not well visualized.  LEFT VENTRICLE PLAX 2D LVIDd:         3.60 cm      Diastology LVIDs:         2.30 cm      LV e' medial:    11.20 cm/s LV PW:         1.30 cm  LV E/e' medial:  8.5 LV IVS:        0.90 cm      LV e' lateral:   12.00 cm/s LVOT diam:     2.20 cm      LV E/e' lateral: 7.9 LV SV:         57 LV SV Index:   34 LVOT Area:     3.80 cm  LV Volumes (MOD) LV vol d, MOD A2C: 66.9 ml LV vol d, MOD A4C: 101.0 ml LV vol s, MOD A2C: 22.7 ml LV vol s, MOD A4C: 24.2 ml LV SV MOD A2C:     44.2 ml LV SV MOD A4C:     101.0 ml LV SV MOD BP:      78.9 ml RIGHT VENTRICLE             IVC RV S prime:     18.20 cm/s  IVC diam: 1.80 cm TAPSE (M-mode): 2.0 cm LEFT ATRIUM           Index       RIGHT ATRIUM           Index LA diam:      3.40 cm 2.00 cm/m  RA Area:     15.10 cm LA Vol (A2C): 13.3 ml 7.83 ml/m  RA Volume:   34.90 ml  20.56 ml/m LA Vol (A4C): 28.1 ml 16.55 ml/m  AORTIC VALVE LVOT Vmax:   126.00 cm/s  LVOT Vmean:  71.400 cm/s LVOT VTI:    0.150 m  AORTA Ao Root diam: 3.40 cm Ao Asc diam:  3.70 cm MITRAL VALVE               TRICUSPID VALVE MV Area (PHT): 5.19 cm    TR Peak grad:   30.9 mmHg MV Decel Time: 146 msec    TR Vmax:        278.00 cm/s MV E velocity: 95.14 cm/s MV A velocity: 84.80 cm/s  SHUNTS MV E/A ratio:  1.12        Systemic VTI:  0.15 m                            Systemic Diam: 2.20 cm Oswaldo Milian MD Electronically signed by Oswaldo Milian MD Signature Date/Time: 09/21/2020/6:08:46 PM    Final    VAS [place] LOWER EXTREMITY VENOUS (DVT)  Result Date: 09/25/2020  Lower Venous DVT Study Patient Name:  MARICE ANGELINO [facility]  Date of Exam:   09/24/2020 Medical Rec #: [id number]          Accession #:    [id number] Date of Birth: Mar 22, 1956           Patient Gender: M Patient Age:   64Y Exam Location:  [facility] Procedure:      VAS [place] LOWER EXTREMITY VENOUS (DVT) Referring Phys: [id number] MURALI RAMASWAMY --------------------------------------------------------------------------------  Indications: Post-Covid severe pulmonary fibrosis, relapsed Hodkin's lymphoma, edema.  Comparison       07-09-2019 Bilateral lower extremity venous was negative for Study:           DVT. Performing Technologist: Darlin Coco RDMS,RVT  Examination Guidelines: A complete evaluation includes B-mode imaging, spectral Doppler, color Doppler, and power Doppler as needed of all accessible portions of each vessel. Bilateral testing is considered an integral part of a complete examination. Limited examinations for reoccurring indications may be performed as noted. The reflux portion of the exam is performed with  the patient in reverse Trendelenburg.  +---------+---------------+---------+-----------+----------+--------------+ RIGHT    CompressibilityPhasicitySpontaneityPropertiesThrombus Aging +---------+---------------+---------+-----------+----------+--------------+ CFV      Full           Yes      Yes                                  +---------+---------------+---------+-----------+----------+--------------+ SFJ      Full                                                        +---------+---------------+---------+-----------+----------+--------------+ FV Prox  Full                                                        +---------+---------------+---------+-----------+----------+--------------+ FV Mid   Full                                                        +---------+---------------+---------+-----------+----------+--------------+ FV DistalFull                                                        +---------+---------------+---------+-----------+----------+--------------+ PFV      Full                                                        +---------+---------------+---------+-----------+----------+--------------+ POP      Full           Yes      Yes                                 +---------+---------------+---------+-----------+----------+--------------+ PTV      Full                                                        +---------+---------------+---------+-----------+----------+--------------+ PERO     Full                                                        +---------+---------------+---------+-----------+----------+--------------+   +---------+---------------+---------+-----------+----------+--------------+ LEFT     CompressibilityPhasicitySpontaneityPropertiesThrombus Aging +---------+---------------+---------+-----------+----------+--------------+ CFV      Full           Yes      Yes                                 +---------+---------------+---------+-----------+----------+--------------+  SFJ      Full                                                        +---------+---------------+---------+-----------+----------+--------------+ FV Prox  Full                                                         +---------+---------------+---------+-----------+----------+--------------+ FV Mid   Full                                                        +---------+---------------+---------+-----------+----------+--------------+ FV DistalFull                                                        +---------+---------------+---------+-----------+----------+--------------+ PFV      Full                                                        +---------+---------------+---------+-----------+----------+--------------+ POP      Full           Yes      Yes                                 +---------+---------------+---------+-----------+----------+--------------+ PTV      Full                                                        +---------+---------------+---------+-----------+----------+--------------+ PERO     Full                                                        +---------+---------------+---------+-----------+----------+--------------+     Summary: RIGHT: - There is no evidence of deep vein thrombosis in the lower extremity.  - No cystic structure found in the popliteal fossa.  - Ultrasound characteristics of enlarged lymph nodes are noted in the groin.  LEFT: - There is no evidence of deep vein thrombosis in the lower extremity.  - No cystic structure found in the popliteal fossa.  *See table(s) above for measurements and observations. Electronically signed by Harold Barban MD on 09/25/2020 at 10:25:35 PM.    Final    [place] EKG SITE RITE  Result Date: 10/06/2020 If Site Rite image not attached, placement could not be confirmed  due to current cardiac rhythm.  US Abdomen Limited RUQ (LIVER/GB)  Result Date: 09/19/2020 CLINICAL DATA:  Transaminitis. EXAM: ULTRASOUND ABDOMEN LIMITED RIGHT UPPER QUADRANT COMPARISON:  None. FINDINGS: Gallbladder: No gallbladder wall thickening. Small amount of biliary sludge. No sonographic Murphy sign noted by sonographer. Common bile duct:  Diameter: 3 mm, normal Liver: No focal lesion identified. Within normal limits in parenchymal echogenicity. Portal vein is patent on color Doppler imaging with normal direction of blood flow towards the liver. Other: None. IMPRESSION: Biliary sludge without evidence of acute cholecystitis. Electronically Signed   By: Valentino Saxon MD   On: 09/19/2020 16:18    Lab Data:  CBC: Recent Labs  Lab 10/11/20 0521  WBC 7.3  HGB 9.9*  HCT 34.4*  MCV 101.2*  PLT 517*   Basic Metabolic Panel: Recent Labs  Lab 10/09/20 0500 10/10/20 0400 10/11/20 0521 10/12/20 0500 10/13/20 0508  NA 140 142 145 145 144  K 4.3 4.1 4.3 4.2 4.1  CL 88* 90* 93* 94* 93*  CO2 >50* 45* 46* 45* 44*  GLUCOSE 134* 138* 135* 161* 123*  BUN 23 32* 36* 36* 32*  CREATININE 0.43* 0.64 0.58* 0.54* 0.53*  CALCIUM 10.2 10.7* 10.8* 10.4* 10.8*  PHOS 3.7 4.8* 4.2 2.9 3.0   GFR: Estimated Creatinine Clearance: 80.1 mL/min (A) (by C-G formula based on SCr of 0.53 mg/dL (L)). Liver Function Tests: Recent Labs  Lab 10/09/20 0500 10/10/20 0400 10/11/20 0521 10/12/20 0500 10/13/20 0508  ALBUMIN 2.8* 2.8* 2.8* 2.8* 2.9*   No results for input(s): LIPASE, AMYLASE in the last 168 hours. No results for input(s): AMMONIA in the last 168 hours. Coagulation Profile: No results for input(s): INR, PROTIME in the last 168 hours.  Cardiac Enzymes: No results for input(s): CKTOTAL, CKMB, CKMBINDEX, TROPONINI in the last 168 hours. BNP (last 3 results) No results for input(s): PROBNP in the last 8760 hours. HbA1C: No results for input(s): HGBA1C in the last 72 hours. CBG: Recent Labs  Lab 10/12/20 1604 10/12/20 1932 10/12/20 2348 10/13/20 0303 10/13/20 0750  GLUCAP 134* 111* 117* 130* 123*   Lipid Profile: No results for input(s): CHOL, HDL, LDLCALC, TRIG, CHOLHDL, LDLDIRECT in the last 72 hours. Thyroid Function Tests: No results for input(s): TSH, T4TOTAL, FREET4, T3FREE, THYROIDAB in the last 72  hours. Anemia Panel: No results for input(s): VITAMINB12, FOLATE, FERRITIN, TIBC, IRON, RETICCTPCT in the last 72 hours. Urine analysis:    Component Value Date/Time   COLORURINE YELLOW 09/19/2020 1511   APPEARANCEUR HAZY (A) 09/19/2020 1511   LABSPEC 1.012 09/19/2020 1511   PHURINE 7.0 09/19/2020 1511   GLUCOSEU NEGATIVE 09/19/2020 1511   HGBUR NEGATIVE 09/19/2020 1511   HGBUR negative 03/23/2007 0000   BILIRUBINUR NEGATIVE 09/19/2020 1511   BILIRUBINUR neg 11/11/2016 1017   KETONESUR NEGATIVE 09/19/2020 1511   PROTEINUR NEGATIVE 09/19/2020 1511   UROBILINOGEN 0.2 11/11/2016 1017   UROBILINOGEN 0.2 11/08/2012 0915   NITRITE NEGATIVE 09/19/2020 1511   LEUKOCYTESUR NEGATIVE 09/19/2020 1511     Devion Chriscoe M.D. [facility] 10/13/2020, 11:23 AM  Available via Epic secure chat 7am-7pm After 7 pm, please refer to night coverage provider listed on amion.

## 2020-10-13 NOTE — Progress Notes (Signed)
NAME:  Steve [name], MRN:  [id number], DOB:  Dec 17, 1955, LOS: 50 ADMISSION DATE:  09/19/2020, CONSULTATION DATE:  09/24/20 REFERRING MD:  Dr Nolberto Hanlon, CHIEF COMPLAINT:  Resp failure   HPI   65 year old male who is well kwn to PCCM service from springs-summer 2021 for s/p covid, respiratoy failure, ARDS, pneumothorax, prolonged mechanical ventilation (? 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  At baseline.   Port-A-Cath placement in December 2020 and gastrostomy tube placement in May 2021.  Of note , has Hodgkins Lymphoma  with recuirrence March 2022 (Bulky intrathoracic adenopathy esp new substernal and newosseous metastatic disease, PET 06/19/2020.). As of April 2022 got 2nd dose Adcetris.  The admitted May 2022 for "pneumonia" and Acetris help (in part due to high LFT) and all Rx held.  Also as a preadmission problem: Chronic caxhexia, protein calorie malnutrition - moderate, failure thrive, DM all as his baseline prior to admission problems/ his baseline ECOG May 2022 was 2  Admitted 09/19/20 (recent baseline 8LO2 via trach). With worsening cough, lethargy.  Blood culture came back positive with Staphylococcus epidermis on 09/22/20 (all 4 of 4 cutuirs), infectious disease and s/p Port-A-Cath removal 09/22/20. As a present on admission problem O 09/22/20 - was needed 10L O2. CT scan this admit 09/20/2020 shwoed worrsening lymphadenopathy in chest concerning for Hodgkins progression. On 6/15 - Seen by Dr Marin Olp and recommended ICE protocol when better (highly immune suppressive regimen). ID had recommended 2 week Rx  . On 6/16 - reports he is more hypoxemic needeing >90% fio2 via Trach collar though no increased work of breathing. CCM consulted  Remains full code despite multiple conversations  Pertinent  Medical History  Covid admission March - May 2021 Chronic vent and trach  dependent  Significant Hospital Events: Including procedures, antibiotic start and stop dates in addition to other pertinent events   6/16 - ccm consult  6/17 -CT angiogram ruled out pulmonary embolism.  He has progressive Hodgkin's disease on the CT scan of the chest.  He was unable to sustain facemask oxygen and has required being placed on pressure support ventilation through the tracheostomy.  He now has a cuffed 6 sized tracheostomy.  Currently on 50% oxygen and saturating well and more comfortable. 6/18 -stuck on pressure support ventilator at 40% FiO2.  Unable to wean off.  He is watching TV and is able to eat 6/22 slowly progressing PSV wean 6/24 tolerating ATC 6/25 tolerated ATC for about 1 and half hours 6/26 very short tolerance of ATC, back on vent quickly due to dyspnea 6/27 off vent all day and night 7/5 TC continuous  Interim History / Subjective:  No events, remains on TC.  Denies pain or SOB  Objective   Blood pressure (!) 89/45, pulse 62, temperature 98.2 F (36.8 C), temperature source Oral, resp. rate (!) 24, height 5\' 5"  (1.651 m), weight 69 kg, SpO2 93 %.    FiO2 (%):  [60 %] 60 %   Intake/Output Summary (Last 24 hours) at 10/13/2020 1012 Last data filed at 10/13/2020 6948 Gross per 24 hour  Intake 5338.67 ml  Output 1950 ml  Net 3388.67 ml    Filed Weights   09/19/20 1930 09/26/20 0258 09/30/20 1646  Weight: 63.3 kg 63.3 kg 69 kg    Examination: Gen: Chronically ill appearing, resting Neuro: alert, follows commands, diffuse weakness Ext: warm, no edema CV: RRR,  no murmur Pulm: Crackles, mild tachypnea on TC  Labs/imaging that I have personally reviewed  (right click and "Reselect all SmartList Selections" daily)  Very high bicarb pCO2 80s 09/24/20   Resolved Hospital Problem list    Assessment & Plan:   Acute on chronic hypercarbic/hypoxemic respiratory failure- baseline 8L TC. Chronic tracheostomy post COVID pulmonary fibrosis  06/2019 Significant tracheobronchomalacia on CT  chronic compensatory metabolic alkalosis due to high dead space Severe Protein calorie malnutrition with cachexia -con't TF at goal -enteral nutrition ad lib-- has been getting outside food brought in -Given elevated pCO2, intermittent ventilator use at home particularly when asleep may reduce risk of re-admission  Deconditioning -PT, OT  -planning to [facility]  Sepsis due to MRSE bacteremia -resolved -Appreciate infectious disease input -Remains on vancomycin to complete course -Off steroids -per cardiology, no TEE. Plan for 4 weeks antibiotics  Relapsed Hodgkin's lymphoma Bulky mediastinal adenopathy on CT on 6/17 -not currently a candidate for treatment, but he remains hopeful  Will see weekly, reach out if any questions or concerns  Best practice (right click and "Reselect all SmartList Selections" daily)  Per primary   Lanier Clam, MD   10/13/20 10:12 AM [facility] See Amion for contact info

## 2020-10-13 NOTE — Progress Notes (Signed)
Occupational Therapy Treatment Patient Details Name: Steve [name] MRN: [id number] DOB: Mar 14, 1956 Today's Date: 10/13/2020    History of present illness Patient  is a 65 year old male who was admitted for possible pneumonia and found to have a staph epidermidis bacteremia. 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  0n 09/24/20, developed respiratory distress with increased oxygen requirement and was transferred to ICU and started on pressure support ventilation. Currently on trach collar on 10L fiO2 at 60% with intermittent vent support.   OT comments  Patient semi-supine in bed upon arrival, agreeable to working on OOB activity tolerance and functional transfers. Patient on 5L 28% FiO2 on trach collar and 10L O2 at start of session maintaining 88-91% in sitting. Patient min G assist and x2 for line management to stand edge of bed for ~2 minutes and able to perform ~20 seconds of marching however patient desating to low 70s with FiO2 increased to 60% on trach and O2 increased to 14L. Unable to recover in sitting therefore returned to semi-supine with patient recovering to low 90s. Notified RN that O2 nozzle may have been loose on wall and OT tightened during session. Will continue to follow acutely.    Follow Up Recommendations  Home health OT;Supervision/Assistance - 24 hour    Equipment Recommendations  3 in 1 bedside commode       Precautions / Restrictions Precautions Precautions: Fall Precaution Comments: trach collar at 5L at 28% fio2, on 10L, intermittent vent use, PEG tube       Mobility Bed Mobility Overal bed mobility: Needs Assistance Bed Mobility: Supine to Sit;Sit to Supine     Supine to sit:  Supervision;HOB elevated Sit to supine: Supervision   General bed mobility comments: no physical assistance in/out of bed    Transfers Overall transfer level: Needs assistance Equipment used: Rolling walker (2 wheeled) Transfers: Sit to/from Stand Sit to Stand: Min guard;+2 safety/equipment         General transfer comment: able to rise from edge of bed, min G for safety and x2 for line management    Balance Overall balance assessment: Needs assistance Sitting-balance support: Feet supported Sitting balance-Leahy Scale: Fair     Standing balance support: Bilateral upper extremity supported Standing balance-Leahy Scale: Poor Standing balance comment: reliant on UE support on walker. patient stood for ~2 minutes before having to return to sitting and desat in O2                           ADL either performed or assessed with clinical judgement   ADL Overall ADL's : Needs assistance/impaired                         Toilet Transfer: Min guard;+2 for safety/equipment;RW Toilet Transfer Details (indicate cue type and reason): sit to stand from edge of bed, min G for safety and +2 for line management         Functional mobility during ADLs: Min guard;+2 for safety/equipment;Rolling walker        Cognition Arousal/Alertness: Awake/alert Behavior During Therapy: Flat affect Overall Cognitive Status: Within Functional Limits for tasks assessed  Exercises Exercises: Other exercises Other Exercises Other Exercises: Marching in place for ~20 seconds however continuing to desat therefore sat back onto edge of bed      General Comments patient desat down to 70%, increased O2 from 10L to 14L and FiO2 to 60% however did not improve. saw that O2 nozzle was loose on wall therefore tightened as patient transitioned back to bed. recovered to mid 90s semi-supine and able to wean back to 10L and 28% Fio2  also informed RN of patient desat with OT    Pertinent Vitals/ Pain       Pain Assessment: No/denies pain         Frequency  Min 2X/week        Progress Toward Goals  OT Goals(current goals can now be found in the care plan section)  Progress towards OT goals: Progressing toward goals  Acute Rehab OT Goals Patient Stated Goal: walk again OT Goal Formulation: With patient Time For Goal Achievement: 10/15/20 Potential to Achieve Goals: Fair ADL Goals Pt Will Perform Lower Body Dressing: with min assist;sit to/from stand;with adaptive equipment Pt Will Transfer to Toilet: with min guard assist;bedside commode Pt Will Perform Toileting - Clothing Manipulation and hygiene: with min assist;sit to/from stand Additional ADL Goal #1: Patient will perform 10 min functional activity or exercise activity as evidence of improving activity tolerance  Plan Discharge plan remains appropriate       AM-PAC OT "6 Clicks" Daily Activity     Outcome Measure   Help from another person eating meals?: A Little Help from another person taking care of personal grooming?: A Little Help from another person toileting, which includes using toliet, bedpan, or urinal?: A Lot Help from another person bathing (including washing, rinsing, drying)?: A Lot Help from another person to put on and taking off regular upper body clothing?: A Little Help from another person to put on and taking off regular lower body clothing?: A Lot 6 Click Score: 15    End of Session Equipment Utilized During Treatment: Oxygen;Rolling walker  OT Visit Diagnosis: Other abnormalities of gait and mobility (R26.89);Muscle weakness (generalized) (M62.81)   Activity Tolerance Patient limited by fatigue   Patient Left in bed;with call bell/phone within reach;with nursing/sitter in room   Nurse Communication Mobility status;Other (comment) (O2 with activity)        Time: 4967-5916 OT Time Calculation (min): 24  min  Charges: OT General Charges $OT Visit: 1 Visit OT Treatments $Therapeutic Activity: 23-37 mins  [name] OT OT pager: [phone]   Rosemary Holms 10/13/2020, 2:27 PM

## 2020-10-14 DIAGNOSIS — R0603 Acute respiratory distress: Secondary | ICD-10-CM

## 2020-10-14 LAB — BLOOD GAS, ARTERIAL
Acid-Base Excess: 15.7 mmol/L — ABNORMAL HIGH (ref 0.0–2.0)
Bicarbonate: 44.2 mmol/L — ABNORMAL HIGH (ref 20.0–28.0)
Drawn by: 560031
FIO2: 80
O2 Content: 10 L/min
O2 Saturation: 98.6 %
Patient temperature: 98.6
pCO2 arterial: 88.7 mmHg (ref 32.0–48.0)
pH, Arterial: 7.318 — ABNORMAL LOW (ref 7.350–7.450)
pO2, Arterial: 114 mmHg — ABNORMAL HIGH (ref 83.0–108.0)

## 2020-10-14 LAB — GLUCOSE, CAPILLARY
Glucose-Capillary: 118 mg/dL — ABNORMAL HIGH (ref 70–99)
Glucose-Capillary: 131 mg/dL — ABNORMAL HIGH (ref 70–99)
Glucose-Capillary: 139 mg/dL — ABNORMAL HIGH (ref 70–99)
Glucose-Capillary: 140 mg/dL — ABNORMAL HIGH (ref 70–99)
Glucose-Capillary: 155 mg/dL — ABNORMAL HIGH (ref 70–99)
Glucose-Capillary: 88 mg/dL (ref 70–99)

## 2020-10-14 LAB — RENAL FUNCTION PANEL
Albumin: 3 g/dL — ABNORMAL LOW (ref 3.5–5.0)
Anion gap: 5 (ref 5–15)
BUN: 29 mg/dL — ABNORMAL HIGH (ref 8–23)
CO2: 44 mmol/L — ABNORMAL HIGH (ref 22–32)
Calcium: 10.3 mg/dL (ref 8.9–10.3)
Chloride: 92 mmol/L — ABNORMAL LOW (ref 98–111)
Creatinine, Ser: 0.38 mg/dL — ABNORMAL LOW (ref 0.61–1.24)
GFR, Estimated: >60 mL/min (ref >60)
Glucose, Bld: 152 mg/dL — ABNORMAL HIGH (ref 70–99)
Phosphorus: 3.6 mg/dL (ref 2.5–4.6)
Potassium: 4.4 mmol/L (ref 3.5–5.1)
Sodium: 141 mmol/L (ref 135–145)

## 2020-10-14 NOTE — Progress Notes (Signed)
PROGRESS NOTE    Steve [name]  ENI:[id number] DOB: 09-06-1955 DOA: 09/19/2020 PCP: [name], PA-C   Chief Complaint  Patient presents with   Hyperkalemia    Brief Narrative:  Briefly, 65 years old male with PMH significant for IRWER-15 infection complicated by ARDS, pulmonary fibrosis / pneumonitis and chronic respiratory failure on 8 L via trach, PEG dependence, Hodgkin's lymphoma on chemo, type 2 diabetes, hypertension and GERD presented in the ED with generalized malaise, poor appetite and failure to thrive and was admitted for possible pneumonia and found to have a staph epidermidis bacteremia.  First negative blood cultures 09/22/20.  0n 09/24/20, patient developed respiratory distress with increased oxygen requirement and was transferred to ICU and started on pressure support ventilation.  Critical care and ID consulted.   ID recommended TEE to rule out infective endocarditis, however not stable to have TEE at this time per cardiology.    ID recommended 6 weeks of IV antibiotics from first negative blood cultures on 09/22/20, if unable to have TEE.  Cardiology was reconsulted to reassess if patient is stable for TEE on 6/28 however still having desaturation episodes and unstable for TEE..   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 -Patient noted to be chronically on 8 L via trach at baseline was placed on PS MV on 616 when patient had respiratory distress. -Patient noted to have needed vent management 10/11/2020 overnight currently on 10 L O2 with 60% FiO2 on  trach with sats of 92 to 100%. -It is noted that per wife patient does not have vent since December 2021, will need mechanical ventilation for intermittent use at home or nightly as needed. -PCCM following and managing trach/vent.. -TOC consulted for vent arrangements at home on discharge.  2.  Sepsis secondary to pneumonia, staph epidermis bacteremia, POA -Patient met criteria for sepsis on presentation with tachypnea, leukocytosis, tachycardia, chest x-ray concerning for recurrent pneumonia. -CT chest with interval increase in bulk of mediastinal and axillary adenopathy. -Blood cultures grew staph epidermis, Port-A-Cath removed 09/24/2020. -Patient noted to have been on cefepime from 09/19/2020>>> 09/23/2020. -Patient seen in consultation by ID who initially recommended TEE to exclude endocarditis however due to patient's respiratory status has been deemed unstable for TEE at this time. -ID recommended vancomycin for minimum of 6 weeks from date of negative culture on 09/22/2020, if TEE not done with stop date of 11/03/2020. -Sepsis physiology improved. -Follow.  3.  Hypotension -Continue midodrine 10 mg 3 times daily.  4.  Chronic metabolic acidosis -Status post Diamox 500 mg p.o. x1 on 10/09/2020 for bicarb > 50. -Bicarb currently at 44. -Follow.  5.  Relapsed Hodgkin's lymphoma -CT chest done concerning for worsening mediastinal and axillary lymphadenopathy. -Patient being followed by oncology, Dr. Marin Olp as well as palliative care medicine.  6.  Diabetes mellitus type 2 -Hemoglobin A1c 7.2 (09/18/2020)/ -CBG 140 this morning. -Continue Levemir 10 units nightly, SSI.  7.  Normocytic anemia/anemia of chronic disease -H&H stable.  8.  Hyponatremia, hypokalemia -Stable.  9.  Severe protein calorie malnutrition -Likely secondary to chronic illness and malignancy. -Continue  current tube feeds.  10.  History of depression, anxiety -Continue Zoloft 100 mg daily. -Outpatient  follow-up with oncology.  11.  Pressure injury, POA Pressure Injury 08/16/20 Sacrum Lower;Medial Deep Tissue Pressure Injury - Purple or maroon localized area of discolored intact skin or blood-filled blister due to damage of underlying soft tissue from pressure and/or shear. (Active)  08/16/20 1200  Location: Sacrum  Location Orientation: Lower;Medial  Staging: Deep Tissue Pressure Injury - Purple or maroon localized area of discolored intact skin or blood-filled blister due to damage of underlying soft tissue from pressure and/or shear.  Wound Description (Comments):   Present on Admission: Yes     Pressure Injury 09/27/20 Sacrum Mid Stage 1 -  Intact skin with non-blanchable redness of a localized area usually over a bony prominence. (Active)  09/27/20 0800  Location: Sacrum  Location Orientation: Mid  Staging: Stage 1 -  Intact skin with non-blanchable redness of a localized area usually over a bony prominence.  Wound Description (Comments):   Present on Admission:          DVT prophylaxis: Lovenox Code Status: Full Family Communication: Updated patient.  No family at bedside. Disposition:   Status is: Inpatient  Remains inpatient appropriate because:Inpatient level of care appropriate due to severity of illness  Dispo: The patient is from: Home              Anticipated d/c is to: Home              Patient currently is not medically stable to d/c.   Difficult to place patient No       Consultants:  Oncology: Dr. Marin Olp 09/21/2020 ID: Dr. Baxter Flattery 09/21/2020 PCCM: Dr. Chase Caller 09/24/2020,    Procedures:  Mechanical ventilation  CT chest 09/20/2020 CT angiogram chest 09/25/2020 Right IJ vein Port-A-Cath explantation per IR, Dr. Laurence Ferrari 09/22/2020 Right upper quadrant abdominal ultrasound 09/19/2020 2D echo 09/21/2020 Lower extremity Doppler 09/24/2020  Antimicrobials:  Anti-infectives (From admission, onward)    Start     Dose/Rate Route Frequency Ordered Stop    09/23/20 0800  ceFEPIme (MAXIPIME) 2 g in sodium chloride 0.9 % 100 mL IVPB  Status:  Discontinued        2 g 200 mL/hr over 30 Minutes Intravenous Every 8 hours 09/23/20 0738 09/23/20 1758   09/21/20 0400  vancomycin (VANCOREADY) IVPB 750 mg/150 mL        750 mg 150 mL/hr over 60 Minutes Intravenous Every 12 hours 09/20/20 1715 11/04/20 0559   09/20/20 1730  vancomycin (VANCOREADY) IVPB 750 mg/150 mL        750 mg 150 mL/hr over 60 Minutes Intravenous STAT 09/20/20 1714 09/20/20 1954   09/20/20 0400  vancomycin (VANCOREADY) IVPB 750 mg/150 mL  Status:  Discontinued        750 mg 150 mL/hr over 60 Minutes Intravenous Every 12 hours 09/19/20 1932 09/20/20 1155   09/20/20 0000  ceFEPIme (MAXIPIME) 2 g in sodium chloride 0.9 % 100 mL IVPB  Status:  Discontinued        2 g 200 mL/hr over 30 Minutes Intravenous Every 8 hours 09/19/20 1932 09/23/20 0721   09/19/20 1600  ceFEPIme (MAXIPIME) 2 g in sodium chloride 0.9 % 100 mL IVPB        2 g 200 mL/hr over 30 Minutes Intravenous  Once 09/19/20 1548 09/19/20 1634   09/19/20 1600  vancomycin (VANCOREADY) IVPB 1250 mg/250 mL        1,250 mg 166.7 mL/hr  over 90 Minutes Intravenous STAT 09/19/20 1557 09/19/20 1822         Subjective: Trach collar on.  Sleeping but arousable.  Denies any chest pain.  No significant shortness of breath.  Objective: Vitals:   10/14/20 0700 10/14/20 0735 10/14/20 0800 10/14/20 0900  BP: (!) 103/56  (!) 94/48 (!) 105/46  Pulse: 73  69 68  Resp: (!) 29  (!) 33 (!) 33  Temp:  97.8 F (36.6 C)    TempSrc:  Oral    SpO2: 94%  (!) 88% 100%  Weight:      Height:        Intake/Output Summary (Last 24 hours) at 10/14/2020 1025 Last data filed at 10/14/2020 0947 Gross per 24 hour  Intake 1596.93 ml  Output 2350 ml  Net -753.07 ml   Filed Weights   09/19/20 1930 09/26/20 0258 09/30/20 1646  Weight: 63.3 kg 63.3 kg 69 kg    Examination:  General exam: NAD.  Trach collar on. Respiratory system: Some  coarse breath sounds anterior lung fields.  Cardiovascular system: S1 & S2 heard, RRR. No JVD, murmurs, rubs, gallops or clicks. No pedal edema. Gastrointestinal system: Abdomen is nondistended, soft and nontender. No organomegaly or masses felt. Normal bowel sounds heard.  PEG tube intact. Central nervous system: Alert and oriented. No focal neurological deficits. Extremities: Symmetric 5 x 5 power. Skin: No rashes, lesions or ulcers Psychiatry: Judgement and insight appear fair. Mood & affect appropriate.     Data Reviewed: I have personally reviewed following labs and imaging studies  CBC: Recent Labs  Lab 10/11/20 0521  WBC 7.3  HGB 9.9*  HCT 34.4*  MCV 101.2*  PLT 125*    Basic Metabolic Panel: Recent Labs  Lab 10/10/20 0400 10/11/20 0521 10/12/20 0500 10/13/20 0508 10/14/20 0500  NA 142 145 145 144 141  K 4.1 4.3 4.2 4.1 4.4  CL 90* 93* 94* 93* 92*  CO2 45* 46* 45* 44* 44*  GLUCOSE 138* 135* 161* 123* 152*  BUN 32* 36* 36* 32* 29*  CREATININE 0.64 0.58* 0.54* 0.53* 0.38*  CALCIUM 10.7* 10.8* 10.4* 10.8* 10.3  PHOS 4.8* 4.2 2.9 3.0 3.6    GFR: Estimated Creatinine Clearance: 80.1 mL/min (A) (by C-G formula based on SCr of 0.38 mg/dL (L)).  Liver Function Tests: Recent Labs  Lab 10/10/20 0400 10/11/20 0521 10/12/20 0500 10/13/20 0508 10/14/20 0500  ALBUMIN 2.8* 2.8* 2.8* 2.9* 3.0*    CBG: Recent Labs  Lab 10/13/20 1541 10/13/20 1934 10/13/20 2325 10/14/20 0400 10/14/20 0744  GLUCAP 132* 123* 138* 155* 140*     No results found for this or any previous visit (from the past 240 hour(s)).       Radiology Studies: No results found.      Scheduled Meds:  chlorhexidine gluconate (MEDLINE KIT)  15 mL Mouth Rinse BID   Chlorhexidine Gluconate Cloth  6 each Topical Q0600   cholecalciferol  10,000 Units Oral Daily   enoxaparin (LOVENOX) injection  40 mg Subcutaneous Q24H   feeding supplement (PROSource TF)  45 mL Per Tube Daily   free  water  100 mL Per Tube Q6H   insulin aspart  0-9 Units Subcutaneous Q4H   insulin glargine  10 Units Subcutaneous QHS   mouth rinse  15 mL Mouth Rinse BID   midodrine  10 mg Per Tube TID WC   multivitamin  15 mL Per Tube Daily   pantoprazole sodium  40 mg Per Tube Daily  sertraline  100 mg Per Tube Daily   sodium chloride flush  10-40 mL Intracatheter Q12H   traZODone  100 mg Per Tube QHS   Continuous Infusions:  sodium chloride 20 mL/hr at 10/04/20 1343   feeding supplement (OSMOLITE 1.2 CAL) Stopped (10/14/20 0915)   vancomycin Stopped (10/14/20 0710)     LOS: 25 days    Time spent: 40 minutes    Irine Seal, MD [facility]   To contact the attending provider between 7A-7P or the covering provider during after hours 7P-7A, please log into the web site [email or web address] and access using universal [place] password for that web site. If you do not have the password, please call the hospital operator.  10/14/2020, 10:25 AM

## 2020-10-14 NOTE — Progress Notes (Signed)
Physical Therapy Treatment Patient Details Name: Steve [name] MRN: [id number] DOB: 1955-08-10 Today's Date: 10/14/2020    History of Present Illness Patient  is a 65 year old male who was admitted for possible pneumonia and found to have a staph epidermidis bacteremia. PMH significant for YFVCB-44 infection complicated by ARDS, pulmonary fibrosis / pneumonitis and chronic respiratory failure on 8 L via trach at baseline, PEG dependence, Hodgkin's lymphoma on chemo, type 2 diabetes, hypertension and GERD presented in the ED with generalized malaise, poor appetite and failure to thrive.  0n 09/24/20, developed respiratory distress with increased oxygen requirement and was transferred to ICU and started on pressure support ventilation. Currently on trach collar on 10L fiO2 at 60% with intermittent vent support.    PT Comments    Pt semi-supine in bed upon arrival. Assited pt OOB to standing bedside with platform walker. Pt using trach collar at 15L on 45% for sit to stand. Pt desat to 80% and unable to bring saturation back up to 90% after ~2 mins.Terminiated standing exercise and did not attempt ambulation due to desat. Pt completed standing pivot +2 assist for safety and line management to recliner. Pt swtiched to NRB at 15L and O2 sats still did no increase to 90%. Placed pt back on trach collar at 10L at 80% and O2 returned to 100%. Consulted with RN throughout session in regards to pt O2sats, O2 liters and fio2%. Pt motivated to engage in TE at end of session.   Vitals: Supine: HR 67, BP 91/63 Dependent sitting: HR 82, BP 109/66   Follow Up Recommendations  No PT follow up     Equipment Recommendations  None recommended by PT    Recommendations for Other Services       Precautions / Restrictions Precautions Precautions: Fall Precaution Comments: trach collar at 10L on 80% fio2 Restrictions Weight Bearing Restrictions: No    Mobility  Bed Mobility Overal bed mobility: Needs  Assistance Bed Mobility: Supine to Sit;Sit to Supine     Supine to sit: Supervision;HOB elevated Sit to supine: Supervision   General bed mobility comments: no physical assistance in/out of bed    Transfers Overall transfer level: Needs assistance Equipment used: Bilateral platform walker Transfers: Sit to/from Stand Sit to Stand: Mod assist;+2 physical assistance;+2 safety/equipment Stand pivot transfers: Min assist          Ambulation/Gait             General Gait Details: no ambulation today d/t pt desat and inability of O2 sat to stay above 90% on NRB at [facility] Mobility    Modified Rankin (Stroke Patients Only)       Balance Overall balance assessment: Needs assistance Sitting-balance support: Feet supported Sitting balance-Leahy Scale: Fair     Standing balance support: Bilateral upper extremity supported Standing balance-Leahy Scale: Poor Standing balance comment: reliant on UE support on platform walker. patient stood for ~2 minutes before having to return to sitting and desat in O2                            Cognition Arousal/Alertness: Awake/alert Behavior During Therapy: WFL for tasks assessed/performed Overall Cognitive Status: Within Functional Limits for tasks assessed  General Comments: pleasant and motivated.  Non-verbal due to [facility] but does mouth words and use hand gestures, spoke out loud some today.      Exercises General Exercises - Lower Extremity Long Arc Quad: AROM;Strengthening;Both;10 reps Hip Flexion/Marching: AROM;Both;10 reps;Seated Other Exercises Other Exercises: seated sit ups with hand hold. x10 reps    General Comments        Pertinent Vitals/Pain Pain Assessment: No/denies pain    Home Living                      Prior Function            PT Goals (current goals can now be found in the care plan section)  Acute Rehab PT Goals Patient Stated Goal: walk again PT Goal Formulation: With patient Time For Goal Achievement: 10/15/20 Potential to Achieve Goals: Good Progress towards PT goals: Progressing toward goals    Frequency    Min 3X/week      PT Plan Current plan remains appropriate    Co-evaluation              AM-PAC PT "6 Clicks" Mobility   Outcome Measure  Help needed turning from your back to your side while in a flat bed without using bedrails?: A Little Help needed moving from lying on your back to sitting on the side of a flat bed without using bedrails?: A Little Help needed moving to and from a bed to a chair (including a wheelchair)?: A Little Help needed standing up from a chair using your arms (e.g., wheelchair or bedside chair)?: A Little Help needed to walk in hospital room?: A Lot Help needed climbing 3-5 steps with a railing? : Total 6 Click Score: 15    End of Session Equipment Utilized During Treatment: Gait belt Activity Tolerance: Patient tolerated treatment well;Treatment limited secondary to medical complications (Comment) Patient left: in chair;with call bell/phone within reach;with chair alarm set Nurse Communication: Mobility status PT Visit Diagnosis: Unsteadiness on feet (R26.81);Muscle weakness (generalized) (M62.81)     Time: 8184-0375 PT Time Calculation (min) (ACUTE ONLY): 30 min  Charges:  $Therapeutic Exercise: 8-22 mins $Therapeutic Activity: 8-22 mins                    Ernst Spell, PTA Student  Acute Rehabilitation Services Pager : [phone] Office : [place]    Ernst Spell 10/14/2020, 1:13 PM

## 2020-10-15 LAB — GLUCOSE, CAPILLARY
Glucose-Capillary: 166 mg/dL — ABNORMAL HIGH (ref 70–99)
Glucose-Capillary: 149 mg/dL — ABNORMAL HIGH (ref 70–99)
Glucose-Capillary: 129 mg/dL — ABNORMAL HIGH (ref 70–99)
Glucose-Capillary: 106 mg/dL — ABNORMAL HIGH (ref 70–99)
Glucose-Capillary: 187 mg/dL — ABNORMAL HIGH (ref 70–99)
Glucose-Capillary: 117 mg/dL — ABNORMAL HIGH (ref 70–99)

## 2020-10-15 LAB — CBC
HCT: 32 % — ABNORMAL LOW (ref 39.0–52.0)
Hemoglobin: 9.3 g/dL — ABNORMAL LOW (ref 13.0–17.0)
MCH: 28.6 pg (ref 26.0–34.0)
MCHC: 29.1 g/dL — ABNORMAL LOW (ref 30.0–36.0)
MCV: 98.5 fL (ref 80.0–100.0)
Platelets: 102 10*3/uL — ABNORMAL LOW (ref 150–400)
RBC: 3.25 MIL/uL — ABNORMAL LOW (ref 4.22–5.81)
RDW: 16.5 % — ABNORMAL HIGH (ref 11.5–15.5)
WBC: 7.1 10*3/uL (ref 4.0–10.5)
nRBC: 0 % (ref 0.0–0.2)

## 2020-10-15 LAB — RENAL FUNCTION PANEL
Albumin: 2.9 g/dL — ABNORMAL LOW (ref 3.5–5.0)
Anion gap: 7 (ref 5–15)
BUN: 27 mg/dL — ABNORMAL HIGH (ref 8–23)
CO2: 42 mmol/L — ABNORMAL HIGH (ref 22–32)
Calcium: 10.7 mg/dL — ABNORMAL HIGH (ref 8.9–10.3)
Chloride: 93 mmol/L — ABNORMAL LOW (ref 98–111)
Creatinine, Ser: 0.54 mg/dL — ABNORMAL LOW (ref 0.61–1.24)
GFR, Estimated: >60 mL/min (ref >60)
Glucose, Bld: 156 mg/dL — ABNORMAL HIGH (ref 70–99)
Phosphorus: 4.3 mg/dL (ref 2.5–4.6)
Potassium: 4.8 mmol/L (ref 3.5–5.1)
Sodium: 142 mmol/L (ref 135–145)

## 2020-10-15 LAB — VANCOMYCIN, TROUGH: Vancomycin Tr: 20 ug/mL (ref 15–20)

## 2020-10-15 MED ORDER — ALBUMIN HUMAN 25 % IV SOLN
25.0000 g | Freq: Four times a day (QID) | INTRAVENOUS | Status: AC
  Administered 2020-10-15 – 2020-10-16 (×4): 25 g via INTRAVENOUS
  Filled 2020-10-15 (×4): qty 100

## 2020-10-15 MED ORDER — CHLORHEXIDINE GLUCONATE 0.12 % MT SOLN
OROMUCOSAL | Status: AC
  Filled 2020-10-15: qty 15

## 2020-10-15 MED ORDER — VANCOMYCIN HCL 1500 MG/300ML IV SOLN
1500.0000 mg | INTRAVENOUS | Status: DC
  Administered 2020-10-15 – 2020-10-22 (×8): 1500 mg via INTRAVENOUS
  Filled 2020-10-15 (×8): qty 300

## 2020-10-15 NOTE — Progress Notes (Signed)
Occupational Therapy Treatment Patient Details Name: Steve [name] MRN: [id number] DOB: 1955-10-02 Today's Date: 10/15/2020    History of present illness Patient  is a 65 year old male who was admitted for possible pneumonia and found to have a staph epidermidis bacteremia. 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  0n 09/24/20, developed respiratory distress with increased oxygen requirement and was transferred to ICU and started on pressure support ventilation. Currently on trach collar on 10L fiO2 at 60% with intermittent vent support.   OT comments  Treatment focused on therapeutic exercises to improve overall activity tolerance. Patient did well seated in chair on 10 L trach collar and 80% fio2 without any drop in o2  saturation. Performed a total of 14 minutes of exercises with short rest breaks. Patient provided with theraband to use while in bed and in chair. Patient verbalized understanding. At end of treatment patient asking to get back in bed to use bedpan. Therapist recommended use of BSC. However, BSC did not have all needed parts and nursing was informed of patient's needs. Patient has made some small gains during POC but continues to be limited by oxygen needs and impaired cardiopulmonary endurance. Will continue to work towards goals.   Follow Up Recommendations  Home health OT;Supervision/Assistance - 24 hour    Equipment Recommendations  3 in 1 bedside commode    Recommendations for Other Services      Precautions / Restrictions             Exercises Shoulder Exercises Shoulder Flexion: (P) Strengthening;10 reps;Theraband;Right;Left Theraband Level (Shoulder Flexion): (P) Level 1  (Yellow) Shoulder Extension: (P) Strengthening;20 reps;Right;Left;Other (comment);Theraband (with scapular retraction) Theraband Level (Shoulder Extension): (P) Level 1 (Yellow) Shoulder External Rotation: (P) Strengthening;Right;Left;10 reps;Theraband Theraband Level (Shoulder External Rotation): (P) Level 1 (Yellow) Other Exercises Other Exercises: hip hikes x 10 each leg, trunk extension/flexion at edge of chair x 5.    Prior Functioning/Environment              Frequency  Min 2X/week        Progress Toward Goals  OT Goals(current goals can now be found in the care plan section)  Progress towards OT goals: Progressing toward goals  Acute Rehab OT Goals Patient Stated Goal: walk to bathroom OT Goal Formulation: With patient Time For Goal Achievement: 10/29/20 Potential to Achieve Goals: Fair ADL Goals Pt Will Perform Lower Body Dressing: with min assist;with adaptive equipment;sit to/from stand Pt Will Transfer to Toilet: with min guard assist;bedside commode Pt Will Perform Toileting - Clothing Manipulation and hygiene: with min assist;sit to/from stand Additional ADL Goal #1: Patient will perform 10 min functional activity or exercise activity as evidence of improving activity tolerance  Plan Discharge plan remains appropriate    Co-evaluation                 AM-PAC OT "6 Clicks" Daily Activity     Outcome Measure   Help from another person eating meals?: None Help from another person taking care of personal grooming?: A Little Help from another person toileting, which includes using toliet, bedpan, or urinal?: A Lot Help from another person bathing (including washing, rinsing, drying)?: A Lot Help from another person to put on and taking off regular upper  body clothing?: A Little   6 Click Score: 14    End of Session Equipment Utilized During Treatment: Gait belt;Oxygen  OT Visit Diagnosis: Other abnormalities of gait and mobility (R26.89);Muscle  weakness (generalized) (M62.81)   Activity Tolerance Patient tolerated treatment well   Patient Left in bed;with call bell/phone within reach;with nursing/sitter in room   Nurse Communication  (need for BM)        Time: 1420-1437 OT Time Calculation (min): 17 min  Charges: OT General Charges $OT Visit: 1 Visit OT Treatments $Therapeutic Exercise: 8-22 mins  Derl Barrow, OTR/L [place]  Office [phone] Pager: [place] 10/15/2020, 4:04 PM

## 2020-10-15 NOTE — Progress Notes (Signed)
Pharmacy Antibiotic Note  Steve [name] is a 65 y.o. male admitted on 09/19/2020 with possible pneumonia, found to have staph epidermidis bacteremia. Pharmacy has been consulted for Vancomycin dosing. 6/21 Last calculated AUC = 456 (goal 400-550)  Today, 10/15/2020:   SCr remains low/stable at 0.54, WBC wnl  (steroids d/c on 6/24).   ID extended treatment: 6 weeks from first negative blood cultures on 09/22/20 since unable to have TEE 6/20 Vanc peak/trough 24/12 on 750mg  q12 7/1 0500 AM Vanc trough = 14 mcg/ml, acceptable 7/7 0521 AM Vanc trough = 20 mcg/ml, above desired range  Plan: Change Vancomycin to 1500 mg q24 Stop date 11/03/20. OPAT orders pended under discharge orders OPAT orders adj for dose/schedule change  Height: 5\' 5"  (165.1 cm) Weight: 69 kg (152 lb 1.9 oz) IBW/kg (Calculated) : 61.5  Temp (24hrs), Avg:98 F (36.7 C), Min:97.4 F (36.3 C), Max:98.7 F (37.1 C)  Recent Labs  Lab 10/09/20 0500 10/10/20 0400 10/11/20 0521 10/12/20 0500 10/13/20 0508 10/14/20 0500 10/15/20 0520  WBC  --   --  7.3  --   --   --  7.1  CREATININE 0.43*   < > 0.58* 0.54* 0.53* 0.38* 0.54*  VANCOTROUGH 14*  --   --   --   --   --  20   < > = values in this interval not displayed.     Estimated Creatinine Clearance: 80.1 mL/min (A) (by C-G formula based on SCr of 0.54 mg/dL (L)).    No Known Allergies Antimicrobials this admission: 6/11 Vancomycin >> 6/12, resumed 6/12 >  (7/26) 6/11 Cefepime >> 6/15 Dose adjustments this admission: 6/14 0536 VP: 37, 1643 VT: 10 - AUC 527, continue Vanc 750mg  q12h  6/20 VP 1800 = 24; 6/21 VT 0330 = 12; AUC 456 7/1 0500 VT = 14, keep 750 q12 7/7 0521 VT = 20, adj Vanc to 1500mg  q24 Microbiology results: 6/11 BCx: 4/4 bottles Staph epi (methicillin resistance detected)  6/11 UCx: multiple species 6/11 Sputum:  needs collecting 6/11 Strep pneumo urinary antigen: neg 6/11 Legionella urinary antigen: neg 6/12 MRSA PCR: neg 6/14 BCx: NGF   Thank you for allowing pharmacy to be a part of this patient's care.  Minda Ditto PharmD 10/15/2020, 11:57 AM

## 2020-10-15 NOTE — Progress Notes (Signed)
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  First negative blood cultures 09/22/20.  0n 09/24/20, patient developed respiratory distress with increased oxygen requirement and was transferred to ICU and started on pressure support ventilation.  Critical care and ID consulted.   ID recommended TEE to rule out infective endocarditis, however not stable to have TEE at this time per cardiology.    ID recommended 6 weeks of IV antibiotics from first negative blood cultures on 09/22/20, if unable to have TEE.  Cardiology was reconsulted to reassess if patient is stable for TEE on 6/28 however still having desaturation episodes and unstable for TEE..   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 -Patient noted to be chronically on 8 L via trach at baseline was placed on PS MV on 616 when patient had respiratory distress. -Patient noted to have needed vent management 10/11/2020 overnight currently on 10  L O2 with 80% FiO2 on trach with sats of 91 to 100%. -It is noted that per wife patient does not have vent since December 2021, will need mechanical ventilation for intermittent use at home or nightly as needed. -PCCM following and managing trach/vent.. -Spoke with patient and patient stating currently NOT interested in going home with mechanical ventilation. -TOC was consulted for vent arrangements at home on discharge.  2.  Sepsis secondary to pneumonia, staph epidermis bacteremia, POA -Patient met criteria for sepsis on presentation with tachypnea, leukocytosis, tachycardia, chest x-ray concerning for recurrent pneumonia. -CT chest with interval increase in bulk of mediastinal and axillary adenopathy. -Blood cultures grew staph epidermis, Port-A-Cath removed 09/24/2020. -Patient noted to have been on cefepime from 09/19/2020>>> 09/23/2020. -Patient seen in consultation by ID who initially recommended TEE to exclude endocarditis however due to patient's respiratory status has been deemed unstable for TEE at this time. -ID recommended vancomycin for minimum of 6 weeks from date of negative culture on 09/22/2020, if TEE not done with stop date of 11/03/2020. -Sepsis physiology improved. -Follow.  3.  Hypotension -Midodrine 10 mg 3 times daily.   -IV albumin every 6 hours x1 day.   -Follow.   4.  Chronic metabolic acidosis -Status post Diamox 500 mg p.o. x1 on 10/09/2020 for bicarb > 50. -Bicarb currently at 42.   -Follow.    5.  Relapsed Hodgkin's lymphoma -CT chest done concerning for worsening mediastinal and axillary lymphadenopathy. -Patient being followed by oncology, Dr. Marin Olp as well as palliative care medicine.  6.  Diabetes mellitus type 2 -Hemoglobin A1c 7.2 (09/18/2020)/ -CBG 149.   -Continue  Levemir, SSI.   7.  Normocytic anemia/anemia of chronic disease -Hemoglobin stable at 9.3.  Follow.   8.  Hyponatremia, hypokalemia -Resolved.   9.  Severe protein calorie  malnutrition -Likely secondary to chronic illness and malignancy. -Continue current tube feeds. -IV albumin every 6 hours x1 day.  10.  History of depression, anxiety -Continue Zoloft 100 mg daily.   -Outpatient follow-up.   11.  Pressure injury, POA Pressure Injury 08/16/20 Sacrum Lower;Medial Deep Tissue Pressure Injury - Purple or maroon localized area of discolored intact skin or blood-filled blister due to damage of underlying soft tissue from pressure and/or shear. (Active)  08/16/20 1200  Location: Sacrum  Location Orientation: Lower;Medial  Staging: Deep Tissue Pressure Injury - Purple or maroon localized area of discolored intact skin or blood-filled blister due to damage of underlying soft tissue from pressure and/or shear.  Wound Description (Comments):   Present on Admission: Yes     Pressure Injury 09/27/20 Sacrum Mid Stage 1 -  Intact skin with non-blanchable redness of a localized area usually over a bony prominence. (Active)  09/27/20 0800  Location: Sacrum  Location Orientation: Mid  Staging: Stage 1 -  Intact skin with non-blanchable redness of a localized area usually over a bony prominence.  Wound Description (Comments):   Present on Admission:          DVT prophylaxis: Lovenox Code Status: Full Family Communication: Updated patient.  No family at bedside. Disposition:   Status is: Inpatient  Remains inpatient appropriate because:Inpatient level of care appropriate due to severity of illness  Dispo: The patient is from: Home              Anticipated d/c is to: Home              Patient currently is not medically stable to d/c.   Difficult to place patient No       Consultants:  Oncology: Dr. Marin Olp 09/21/2020 ID: Dr. Baxter Flattery 09/21/2020 PCCM: Dr. Chase Caller 09/24/2020,    Procedures:  Mechanical ventilation  CT chest 09/20/2020 CT angiogram chest 09/25/2020 Right IJ vein Port-A-Cath explantation per IR, Dr. Laurence Ferrari 09/22/2020 Right upper  quadrant abdominal ultrasound 09/19/2020 2D echo 09/21/2020 Lower extremity Doppler 09/24/2020  Antimicrobials:  Anti-infectives (From admission, onward)    Start     Dose/Rate Route Frequency Ordered Stop   09/23/20 0800  ceFEPIme (MAXIPIME) 2 g in sodium chloride 0.9 % 100 mL IVPB  Status:  Discontinued        2 g 200 mL/hr over 30 Minutes Intravenous Every 8 hours 09/23/20 0738 09/23/20 1758   09/21/20 0400  vancomycin (VANCOREADY) IVPB 750 mg/150 mL        750 mg 150 mL/hr over 60 Minutes Intravenous Every 12 hours 09/20/20 1715 11/04/20 0559   09/20/20 1730  vancomycin (VANCOREADY) IVPB 750 mg/150 mL        750 mg 150 mL/hr over 60 Minutes Intravenous STAT 09/20/20 1714 09/20/20 1954   09/20/20 0400  vancomycin (VANCOREADY) IVPB 750 mg/150 mL  Status:  Discontinued        750 mg 150 mL/hr over 60 Minutes Intravenous Every 12 hours 09/19/20 1932 09/20/20 1155   09/20/20 0000  ceFEPIme (MAXIPIME) 2 g in sodium chloride 0.9 % 100 mL IVPB  Status:  Discontinued        2 g 200 mL/hr over 30 Minutes Intravenous Every 8 hours 09/19/20 1932 09/23/20 0721   09/19/20 1600  ceFEPIme (MAXIPIME) 2 g in sodium chloride  0.9 % 100 mL IVPB        2 g 200 mL/hr over 30 Minutes Intravenous  Once 09/19/20 1548 09/19/20 1634   09/19/20 1600  vancomycin (VANCOREADY) IVPB 1250 mg/250 mL        1,250 mg 166.7 mL/hr over 90 Minutes Intravenous STAT 09/19/20 1557 09/19/20 1822         Subjective: Sleeping with trach collar on.  Arousable.  Denies any chest pain.  No significant shortness of breath.  Patient stated not interested in going home on ventilator.    Objective: Vitals:   10/15/20 0735 10/15/20 0758 10/15/20 0800 10/15/20 0900  BP:  (!) 107/49 (!) 102/46 97/63  Pulse:  90 89 (!) 108  Resp:  (!) 25 (!) 36 (!) 40  Temp: 97.9 F (36.6 C)     TempSrc: Oral     SpO2:  91% 92% 100%  Weight:      Height:        Intake/Output Summary (Last 24 hours) at 10/15/2020 1042 Last data filed at  10/15/2020 7530 Gross per 24 hour  Intake 1043.1 ml  Output 1775 ml  Net -731.9 ml    Filed Weights   09/19/20 1930 09/26/20 0258 09/30/20 1646  Weight: 63.3 kg 63.3 kg 69 kg    Examination:  General exam: NAD.  Trach collar on. Respiratory system: Coarse breath sounds anterior lung fields.  No wheezing. Cardiovascular system: RRR no murmurs rubs or gallops.  No JVD.  No lower extremity edema.   Gastrointestinal system: Abdomen is soft, nontender, nondistended, positive bowel sounds.  PEG tube intact. Central nervous system: Alert and oriented.  Moving extremities spontaneously.  No focal neurological deficits.   Extremities: Symmetric 5 x 5 power. Skin: No rashes, lesions or ulcers Psychiatry: Judgement and insight appear fair. Mood & affect appropriate.     Data Reviewed: I have personally reviewed following labs and imaging studies  CBC: Recent Labs  Lab 10/11/20 0521 10/15/20 0520  WBC 7.3 7.1  HGB 9.9* 9.3*  HCT 34.4* 32.0*  MCV 101.2* 98.5  PLT 125* 102*     Basic Metabolic Panel: Recent Labs  Lab 10/11/20 0521 10/12/20 0500 10/13/20 0508 10/14/20 0500 10/15/20 0520  NA 145 145 144 141 142  K 4.3 4.2 4.1 4.4 4.8  CL 93* 94* 93* 92* 93*  CO2 46* 45* 44* 44* 42*  GLUCOSE 135* 161* 123* 152* 156*  BUN 36* 36* 32* 29* 27*  CREATININE 0.58* 0.54* 0.53* 0.38* 0.54*  CALCIUM 10.8* 10.4* 10.8* 10.3 10.7*  PHOS 4.2 2.9 3.0 3.6 4.3     GFR: Estimated Creatinine Clearance: 80.1 mL/min (A) (by C-G formula based on SCr of 0.54 mg/dL (L)).  Liver Function Tests: Recent Labs  Lab 10/11/20 0521 10/12/20 0500 10/13/20 0508 10/14/20 0500 10/15/20 0520  ALBUMIN 2.8* 2.8* 2.9* 3.0* 2.9*     CBG: Recent Labs  Lab 10/14/20 1610 10/14/20 1936 10/14/20 2333 10/15/20 0321 10/15/20 0744  GLUCAP 118* 139* 131* 166* 149*      No results found for this or any previous visit (from the past 240 hour(s)).       Radiology Studies: No results  found.      Scheduled Meds:  chlorhexidine gluconate (MEDLINE KIT)  15 mL Mouth Rinse BID   Chlorhexidine Gluconate Cloth  6 each Topical Q0600   cholecalciferol  10,000 Units Oral Daily   enoxaparin (LOVENOX) injection  40 mg Subcutaneous Q24H   feeding supplement (PROSource TF)  45 mL Per Tube Daily   free water  100 mL Per Tube Q6H   insulin aspart  0-9 Units Subcutaneous Q4H   insulin glargine  10 Units Subcutaneous QHS   mouth rinse  15 mL Mouth Rinse BID   midodrine  10 mg Per Tube TID WC   multivitamin  15 mL Per Tube Daily   pantoprazole sodium  40 mg Per Tube Daily   sertraline  100 mg Per Tube Daily   sodium chloride flush  10-40 mL Intracatheter Q12H   traZODone  100 mg Per Tube QHS   Continuous Infusions:  sodium chloride 20 mL/hr at 10/04/20 1343   albumin human 25 g (10/15/20 0944)   feeding supplement (OSMOLITE 1.2 CAL) Stopped (10/14/20 0915)   vancomycin Stopped (10/15/20 5241)     LOS: 26 days    Time spent: 40 minutes    Irine Seal, MD [facility]   To contact the attending provider between 7A-7P or the covering provider during after hours 7P-7A, please log into the web site [email or web address] and access using universal [place] password for that web site. If you do not have the password, please call the hospital operator.  10/15/2020, 10:42 AM

## 2020-10-16 ENCOUNTER — Encounter: Payer: Self-pay | Admitting: *Deleted

## 2020-10-16 LAB — CBC WITH DIFFERENTIAL/PLATELET
Abs Immature Granulocytes: 0.02 10*3/uL (ref 0.00–0.07)
Basophils Absolute: 0 10*3/uL (ref 0.0–0.1)
Basophils Relative: 0 %
Eosinophils Absolute: 0.4 10*3/uL (ref 0.0–0.5)
Eosinophils Relative: 7 %
HCT: 29.9 % — ABNORMAL LOW (ref 39.0–52.0)
Hemoglobin: 8.8 g/dL — ABNORMAL LOW (ref 13.0–17.0)
Immature Granulocytes: 0 %
Lymphocytes Relative: 30 %
Lymphs Abs: 1.8 10*3/uL (ref 0.7–4.0)
MCH: 29 pg (ref 26.0–34.0)
MCHC: 29.4 g/dL — ABNORMAL LOW (ref 30.0–36.0)
MCV: 98.7 fL (ref 80.0–100.0)
Monocytes Absolute: 0.5 10*3/uL (ref 0.1–1.0)
Monocytes Relative: 9 %
Neutro Abs: 3.2 10*3/uL (ref 1.7–7.7)
Neutrophils Relative %: 54 %
Platelets: 95 10*3/uL — ABNORMAL LOW (ref 150–400)
RBC: 3.03 MIL/uL — ABNORMAL LOW (ref 4.22–5.81)
RDW: 16.3 % — ABNORMAL HIGH (ref 11.5–15.5)
WBC: 5.9 10*3/uL (ref 4.0–10.5)
nRBC: 0 % (ref 0.0–0.2)

## 2020-10-16 LAB — RENAL FUNCTION PANEL
Albumin: 3.7 g/dL (ref 3.5–5.0)
Anion gap: 6 (ref 5–15)
BUN: 25 mg/dL — ABNORMAL HIGH (ref 8–23)
CO2: 42 mmol/L — ABNORMAL HIGH (ref 22–32)
Calcium: 10.8 mg/dL — ABNORMAL HIGH (ref 8.9–10.3)
Chloride: 93 mmol/L — ABNORMAL LOW (ref 98–111)
Creatinine, Ser: 0.57 mg/dL — ABNORMAL LOW (ref 0.61–1.24)
GFR, Estimated: >60 mL/min (ref >60)
Glucose, Bld: 149 mg/dL — ABNORMAL HIGH (ref 70–99)
Phosphorus: 4 mg/dL (ref 2.5–4.6)
Potassium: 4.6 mmol/L (ref 3.5–5.1)
Sodium: 141 mmol/L (ref 135–145)

## 2020-10-16 LAB — GLUCOSE, CAPILLARY
Glucose-Capillary: 109 mg/dL — ABNORMAL HIGH (ref 70–99)
Glucose-Capillary: 183 mg/dL — ABNORMAL HIGH (ref 70–99)
Glucose-Capillary: 81 mg/dL (ref 70–99)
Glucose-Capillary: 139 mg/dL — ABNORMAL HIGH (ref 70–99)
Glucose-Capillary: 128 mg/dL — ABNORMAL HIGH (ref 70–99)
Glucose-Capillary: 131 mg/dL — ABNORMAL HIGH (ref 70–99)

## 2020-10-16 MED ORDER — TRAZODONE HCL 50 MG PO TABS
100.0000 mg | ORAL_TABLET | Freq: Every day | ORAL | Status: DC
  Administered 2020-10-16 – 2020-10-26 (×11): 100 mg via ORAL
  Filled 2020-10-16 (×11): qty 2

## 2020-10-16 MED ORDER — GUAIFENESIN 100 MG/5ML PO SOLN: 5.0000 mL | ORAL | Status: DC | PRN

## 2020-10-16 MED ORDER — SERTRALINE HCL 100 MG PO TABS
100.0000 mg | ORAL_TABLET | Freq: Every day | ORAL | Status: DC
  Administered 2020-10-16 – 2020-10-26 (×11): 100 mg via ORAL
  Filled 2020-10-16 (×11): qty 1

## 2020-10-16 MED ORDER — HYOSCYAMINE SULFATE 0.125 MG/5ML PO ELIX
0.1250 mg | ORAL_SOLUTION | ORAL | Status: DC | PRN
  Filled 2020-10-16: qty 5

## 2020-10-16 MED ORDER — HYOSCYAMINE SULFATE 0.125 MG SL SUBL: 0.1250 mg | SUBLINGUAL_TABLET | SUBLINGUAL | Status: DC | PRN

## 2020-10-16 MED ORDER — ADULT MULTIVITAMIN W/MINERALS CH
1.0000 | ORAL_TABLET | Freq: Every day | ORAL | Status: DC
  Administered 2020-10-17 – 2020-10-26 (×10): 1 via ORAL
  Filled 2020-10-16 (×11): qty 1

## 2020-10-16 MED ORDER — MIDODRINE HCL 5 MG PO TABS
10.0000 mg | ORAL_TABLET | Freq: Three times a day (TID) | ORAL | Status: DC
  Administered 2020-10-16 – 2020-10-26 (×32): 10 mg via ORAL
  Filled 2020-10-16 (×32): qty 2

## 2020-10-16 MED ORDER — PANTOPRAZOLE SODIUM 40 MG PO TBEC
40.0000 mg | DELAYED_RELEASE_TABLET | Freq: Every day | ORAL | Status: DC
  Administered 2020-10-16 – 2020-10-26 (×11): 40 mg via ORAL
  Filled 2020-10-16 (×11): qty 1

## 2020-10-16 MED ORDER — LORAZEPAM 0.5 MG PO TABS: 0.5000 mg | ORAL_TABLET | Freq: Four times a day (QID) | ORAL | Status: DC | PRN

## 2020-10-16 NOTE — Progress Notes (Signed)
Patient desires full scope medical interventions and ongoing aggressive care interventions. He has made improvements in terms of stability and O2 requirements and plan is to aggressively treat his lymphoma and will likely discharge home with tracheostomy. Re-consult if his goals change or his condition deteriorates.  Lane Hacker, DO Palliative Medicine

## 2020-10-16 NOTE — Progress Notes (Signed)
Patient continues to be hospitalized. Continue to follow for post discharge follow up and treatment needs.  Oncology Nurse Navigator Documentation  Oncology Nurse Navigator Flowsheets 10/16/2020  Abnormal Finding Date -  Confirmed Diagnosis Date -  Diagnosis Status -  Planned Course of Treatment -  Phase of Treatment -  Chemotherapy Pending- Reason: -  Chemotherapy Actual Start Date: -  Navigator Follow Up Date: 10/23/2020  Navigator Follow Up Reason: Appointment Review  Navigator Location [facility]  Referral Date to RadOnc/MedOnc -  Navigator Encounter Type Appt/Treatment Plan Review  Telephone -  Treatment Initiated Date -  Patient Visit Type MedOnc  Treatment Phase Active Tx  Barriers/Navigation Needs Coordination of Care;Education;Employed;Family Concerns;Morbidities/Frailty;Multiple Hospital Admissions  Education -  Interventions None Required  Acuity Level 4-High Needs (Greater Than 4 Barriers Identified)  Referrals -  Coordination of Care -  Education Method -  Support Groups/Services Friends and Family  Time Spent with Patient 15

## 2020-10-16 NOTE — Progress Notes (Signed)
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  First negative blood cultures 09/22/20.  0n 09/24/20, patient developed respiratory distress with increased oxygen requirement and was transferred to ICU and started on pressure support ventilation.  Critical care and ID consulted.   ID recommended TEE to rule out infective endocarditis, however not stable to have TEE at this time per cardiology.    ID recommended 6 weeks of IV antibiotics from first negative blood cultures on 09/22/20, if unable to have TEE.  Cardiology was reconsulted to reassess if patient is stable for TEE on 6/28 however still having desaturation episodes and unstable for TEE..   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 -Patient noted to be chronically on 8 L via trach at baseline was placed on PS MV on 616 when patient had respiratory distress. -Patient noted to have needed vent management 10/11/2020 overnight currently on 10  L O2 with 80% FiO2 on trach with sats 100%. -It is noted that per wife patient does not have vent since December 2021, will need mechanical ventilation for intermittent use at home or nightly as needed. -PCCM following and managing trach/vent.. -Spoke with patient and patient stating currently NOT interested in going home with mechanical ventilation. -TOC was consulted for vent arrangements at home on discharge.  2.  Sepsis secondary to pneumonia, staph epidermis bacteremia, POA -Patient met criteria for sepsis on presentation with tachypnea, leukocytosis, tachycardia, chest x-ray concerning for recurrent pneumonia. -CT chest with interval increase in bulk of mediastinal and axillary adenopathy. -Blood cultures grew staph epidermis, Port-A-Cath removed 09/24/2020. -Patient noted to have been on cefepime from 09/19/2020>>> 09/23/2020. -Patient seen in consultation by ID who initially recommended TEE to exclude endocarditis however due to patient's respiratory status has been deemed unstable for TEE at this time. -ID recommended vancomycin for minimum of 6 weeks from date of negative culture on 09/22/2020, if TEE not done with stop date of 11/03/2020. -Sepsis physiology improved. -Follow.  3.  Hypotension -Status post IV albumin every 6 hours x1 day.   -Continue midodrine 10 mg 3 times daily.  4.  Chronic metabolic acidosis -Status post Diamox 500 mg p.o. x1 on 10/09/2020 for bicarb > 50. -Bicarb at 42.    5.  Relapsed Hodgkin's lymphoma -CT chest done concerning for worsening mediastinal and axillary lymphadenopathy. -Patient being followed by oncology, Dr. Marin Olp as well as palliative care medicine.  6.  Diabetes mellitus type 2 -Hemoglobin A1c 7.2 (09/18/2020)/ -CBG 183 this morning. -Continue Levemir, SSI.   7.  Normocytic anemia/anemia  of chronic disease -Hemoglobin at 8.8.  Follow.   8.  Hyponatremia, hypokalemia -Repleted.    9.  Severe protein calorie malnutrition -Likely  secondary to chronic illness and malignancy. -Continue current tube feeds. -Status post IV albumin every 6 hours x1 day.  10.  History of depression, anxiety -Zoloft 100 mg daily.   -Outpatient follow-up.   11.  Pressure injury, POA Pressure Injury 08/16/20 Sacrum Lower;Medial Deep Tissue Pressure Injury - Purple or maroon localized area of discolored intact skin or blood-filled blister due to damage of underlying soft tissue from pressure and/or shear. (Active)  08/16/20 1200  Location: Sacrum  Location Orientation: Lower;Medial  Staging: Deep Tissue Pressure Injury - Purple or maroon localized area of discolored intact skin or blood-filled blister due to damage of underlying soft tissue from pressure and/or shear.  Wound Description (Comments):   Present on Admission: Yes     Pressure Injury 09/27/20 Sacrum Mid Stage 1 -  Intact skin with non-blanchable redness of a localized area usually over a bony prominence. (Active)  09/27/20 0800  Location: Sacrum  Location Orientation: Mid  Staging: Stage 1 -  Intact skin with non-blanchable redness of a localized area usually over a bony prominence.  Wound Description (Comments):   Present on Admission:          DVT prophylaxis: Lovenox Code Status: Full Family Communication: Updated patient.  No family at bedside. Disposition:   Status is: Inpatient  Remains inpatient appropriate because:Inpatient level of care appropriate due to severity of illness  Dispo: The patient is from: Home              Anticipated d/c is to: Home              Patient currently is not medically stable to d/c.   Difficult to place patient No       Consultants:  Oncology: Dr. Marin Olp 09/21/2020 ID: Dr. Baxter Flattery 09/21/2020 PCCM: Dr. Chase Caller 09/24/2020,    Procedures:  Mechanical ventilation  CT chest 09/20/2020 CT angiogram chest 09/25/2020 Right IJ vein Port-A-Cath explantation per IR, Dr. Laurence Ferrari 09/22/2020 Right upper quadrant abdominal  ultrasound 09/19/2020 2D echo 09/21/2020 Lower extremity Doppler 09/24/2020  Antimicrobials:  Anti-infectives (From admission, onward)    Start     Dose/Rate Route Frequency Ordered Stop   10/15/20 2200  vancomycin (VANCOREADY) IVPB 1500 mg/300 mL        1,500 mg 150 mL/hr over 120 Minutes Intravenous Every 24 hours 10/15/20 1219 11/04/20 2159   09/23/20 0800  ceFEPIme (MAXIPIME) 2 g in sodium chloride 0.9 % 100 mL IVPB  Status:  Discontinued        2 g 200 mL/hr over 30 Minutes Intravenous Every 8 hours 09/23/20 0738 09/23/20 1758   09/21/20 0400  vancomycin (VANCOREADY) IVPB 750 mg/150 mL  Status:  Discontinued        750 mg 150 mL/hr over 60 Minutes Intravenous Every 12 hours 09/20/20 1715 10/15/20 1219   09/20/20 1730  vancomycin (VANCOREADY) IVPB 750 mg/150 mL        750 mg 150 mL/hr over 60 Minutes Intravenous STAT 09/20/20 1714 09/20/20 1954   09/20/20 0400  vancomycin (VANCOREADY) IVPB 750 mg/150 mL  Status:  Discontinued        750 mg 150 mL/hr over 60 Minutes Intravenous Every 12 hours 09/19/20 1932 09/20/20 1155   09/20/20 0000  ceFEPIme (MAXIPIME) 2 g in sodium chloride 0.9 % 100 mL IVPB  Status:  Discontinued  2 g 200 mL/hr over 30 Minutes Intravenous Every 8 hours 09/19/20 1932 09/23/20 0721   09/19/20 1600  ceFEPIme (MAXIPIME) 2 g in sodium chloride 0.9 % 100 mL IVPB        2 g 200 mL/hr over 30 Minutes Intravenous  Once 09/19/20 1548 09/19/20 1634   09/19/20 1600  vancomycin (VANCOREADY) IVPB 1250 mg/250 mL        1,250 mg 166.7 mL/hr over 90 Minutes Intravenous STAT 09/19/20 1557 09/19/20 1822         Subjective: Patient in deep sleep.  Breakfast noted on table with 50-75% of meal eaten.  On trach collar.  Objective: Vitals:   10/16/20 0600 10/16/20 0700 10/16/20 0800 10/16/20 0900  BP: (!) 90/52 (!) 82/46 112/60 (!) 108/55  Pulse: 69 63 82 71  Resp: (!) 34 (!) 26 (!) 37 (!) 36  Temp:   98.4 F (36.9 C)   TempSrc:   Axillary   SpO2: 96% 100% 96%  100%  Weight:      Height:        Intake/Output Summary (Last 24 hours) at 10/16/2020 0955 Last data filed at 10/16/2020 0816 Gross per 24 hour  Intake 1108.05 ml  Output 2650 ml  Net -1541.95 ml    Filed Weights   09/19/20 1930 09/26/20 0258 09/30/20 1646  Weight: 63.3 kg 63.3 kg 69 kg    Examination:  General exam: Trach collar on Respiratory system: Some coarse breath sounds anterior lung fields.  No wheezing.  No crackles.  Cardiovascular system: Regular rate rhythm no murmurs rubs or gallops.  No JVD.  No lower extremity edema. Gastrointestinal system: Abdomen is soft, nontender, nondistended, positive bowel sounds.  PEG tube intact. Central nervous system: Sleeping deeply..  Moving extremities spontaneously.  No focal neurological deficits.   Extremities: Symmetric 5 x 5 power. Skin: No rashes, lesions or ulcers Psychiatry: Judgement and insight unable to assess as patient is asleep.. Mood & affect appropriate.     Data Reviewed: I have personally reviewed following labs and imaging studies  CBC: Recent Labs  Lab 10/11/20 0521 10/15/20 0520 10/16/20 0500  WBC 7.3 7.1 5.9  NEUTROABS  --   --  3.2  HGB 9.9* 9.3* 8.8*  HCT 34.4* 32.0* 29.9*  MCV 101.2* 98.5 98.7  PLT 125* 102* 95*     Basic Metabolic Panel: Recent Labs  Lab 10/12/20 0500 10/13/20 0508 10/14/20 0500 10/15/20 0520 10/16/20 0500  NA 145 144 141 142 141  K 4.2 4.1 4.4 4.8 4.6  CL 94* 93* 92* 93* 93*  CO2 45* 44* 44* 42* 42*  GLUCOSE 161* 123* 152* 156* 149*  BUN 36* 32* 29* 27* 25*  CREATININE 0.54* 0.53* 0.38* 0.54* 0.57*  CALCIUM 10.4* 10.8* 10.3 10.7* 10.8*  PHOS 2.9 3.0 3.6 4.3 4.0     GFR: Estimated Creatinine Clearance: 80.1 mL/min (A) (by C-G formula based on SCr of 0.57 mg/dL (L)).  Liver Function Tests: Recent Labs  Lab 10/12/20 0500 10/13/20 0508 10/14/20 0500 10/15/20 0520 10/16/20 0500  ALBUMIN 2.8* 2.9* 3.0* 2.9* 3.7     CBG: Recent Labs  Lab 10/15/20 1611  10/15/20 1927 10/15/20 2324 10/16/20 0350 10/16/20 0757  GLUCAP 106* 187* 117* 109* 183*      No results found for this or any previous visit (from the past 240 hour(s)).       Radiology Studies: No results found.      Scheduled Meds:  chlorhexidine gluconate (MEDLINE KIT)  15  mL Mouth Rinse BID   Chlorhexidine Gluconate Cloth  6 each Topical Q0600   cholecalciferol  10,000 Units Oral Daily   enoxaparin (LOVENOX) injection  40 mg Subcutaneous Q24H   feeding supplement (PROSource TF)  45 mL Per Tube Daily   free water  100 mL Per Tube Q6H   insulin aspart  0-9 Units Subcutaneous Q4H   insulin glargine  10 Units Subcutaneous QHS   mouth rinse  15 mL Mouth Rinse BID   midodrine  10 mg Oral TID WC   multivitamin with minerals  1 tablet Oral Daily   sertraline  100 mg Oral Daily   sodium chloride flush  10-40 mL Intracatheter Q12H   traZODone  100 mg Oral QHS   Continuous Infusions:  sodium chloride Stopped (10/15/20 2243)   feeding supplement (OSMOLITE 1.2 CAL) 1,000 mL (10/15/20 1753)   vancomycin Stopped (10/16/20 0043)     LOS: 27 days    Time spent: 35 minutes    Irine Seal, MD [facility]   To contact the attending provider between 7A-7P or the covering provider during after hours 7P-7A, please log into the web site [email or web address] and access using universal [place] password for that web site. If you do not have the password, please call the hospital operator.  10/16/2020, 9:55 AM

## 2020-10-17 LAB — GLUCOSE, CAPILLARY
Glucose-Capillary: 176 mg/dL — ABNORMAL HIGH (ref 70–99)
Glucose-Capillary: 140 mg/dL — ABNORMAL HIGH (ref 70–99)
Glucose-Capillary: 147 mg/dL — ABNORMAL HIGH (ref 70–99)
Glucose-Capillary: 109 mg/dL — ABNORMAL HIGH (ref 70–99)
Glucose-Capillary: 174 mg/dL — ABNORMAL HIGH (ref 70–99)
Glucose-Capillary: 146 mg/dL — ABNORMAL HIGH (ref 70–99)

## 2020-10-17 LAB — RENAL FUNCTION PANEL
Albumin: 3.9 g/dL (ref 3.5–5.0)
Anion gap: 5 (ref 5–15)
BUN: 27 mg/dL — ABNORMAL HIGH (ref 8–23)
CO2: 42 mmol/L — ABNORMAL HIGH (ref 22–32)
Calcium: 10.6 mg/dL — ABNORMAL HIGH (ref 8.9–10.3)
Chloride: 93 mmol/L — ABNORMAL LOW (ref 98–111)
Creatinine, Ser: 0.7 mg/dL (ref 0.61–1.24)
GFR, Estimated: >60 mL/min (ref >60)
Glucose, Bld: 144 mg/dL — ABNORMAL HIGH (ref 70–99)
Phosphorus: 3.4 mg/dL (ref 2.5–4.6)
Potassium: 4.7 mmol/L (ref 3.5–5.1)
Sodium: 140 mmol/L (ref 135–145)

## 2020-10-17 LAB — CREATININE, SERUM
Creatinine, Ser: 0.68 mg/dL (ref 0.61–1.24)
GFR, Estimated: >60 mL/min (ref >60)

## 2020-10-17 MED ORDER — ACETAMINOPHEN 325 MG PO TABS
650.0000 mg | ORAL_TABLET | ORAL | Status: DC | PRN
  Administered 2020-10-17: 650 mg via ORAL
  Filled 2020-10-17: qty 2

## 2020-10-17 NOTE — Progress Notes (Signed)
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  First negative blood cultures 09/22/20.  0n 09/24/20, patient developed respiratory distress with increased oxygen requirement and was transferred to ICU and started on pressure support ventilation.  Critical care and ID consulted.   ID recommended TEE to rule out infective endocarditis, however not stable to have TEE at this time per cardiology.    ID recommended 6 weeks of IV antibiotics from first negative blood cultures on 09/22/20, if unable to have TEE.  Cardiology was reconsulted to reassess if patient is stable for TEE on 6/28 however still having desaturation episodes and unstable for TEE..   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 -Patient noted to be chronically on 8 L via trach at baseline was placed on PS MV on 616 when patient had respiratory distress. -Patient noted to have needed vent management 10/11/2020 overnight currently on 10  L O2 with 60% FiO2 on trach with sats 98 %. -It is noted that per wife patient does not have vent since December 2021, will need mechanical ventilation for intermittent use at home or nightly as needed. -PCCM following and managing trach/vent.. -Spoke with patient and patient stating currently NOT interested in going home with mechanical ventilation. -TOC was consulted for vent arrangements at home on discharge.  2.  Sepsis secondary to pneumonia, staph epidermis bacteremia, POA -Patient met criteria for sepsis on presentation with tachypnea, leukocytosis, tachycardia, chest x-ray concerning for recurrent pneumonia. -CT chest with interval increase in bulk of mediastinal and axillary adenopathy. -Blood cultures grew staph epidermis, Port-A-Cath removed 09/24/2020. -Patient noted to have been on cefepime from 09/19/2020>>> 09/23/2020. -Patient seen in consultation by ID who initially recommended TEE to exclude endocarditis however due to patient's respiratory status has been deemed unstable for TEE at this time. -ID recommended vancomycin for minimum of 6 weeks from date of negative culture on 09/22/2020, if TEE not done with stop date of 11/03/2020. -Sepsis physiology improved. -Follow.  3.  Hypotension -Status post IV albumin every 6 hours x1 day.  -Asymptomatic -Continue midodrine 10 mg 3 times daily.  4.  Chronic metabolic acidosis -Status post Diamox 500 mg p.o. x1 on 10/09/2020 for bicarb > 50. -Bicarb at 42.    5.  Relapsed Hodgkin's lymphoma -CT chest done concerning for worsening mediastinal and axillary lymphadenopathy. -Patient being followed by oncology, Dr. Marin Olp as well as palliative care medicine.  6.  Diabetes mellitus type 2 -Hemoglobin A1c 7.2 (09/18/2020)/ -CBG 140.   -Continue Levemir, SSI.   7.  Normocytic  anemia/anemia of chronic disease -Hemoglobin 8.8.   8.  Hyponatremia, hypokalemia -Repleted.   9.  Severe protein calorie malnutrition -Likely secondary to  chronic illness and malignancy. -Continue current tube feeds. -Tolerating oral intake as well. -Status post IV albumin every 6 hours x1 day.  10.  History of depression, anxiety -Continue Zoloft 100 mg daily.   -Outpatient follow-up.    11.  Pressure injury, POA Pressure Injury 08/16/20 Sacrum Lower;Medial Deep Tissue Pressure Injury - Purple or maroon localized area of discolored intact skin or blood-filled blister due to damage of underlying soft tissue from pressure and/or shear. (Active)  08/16/20 1200  Location: Sacrum  Location Orientation: Lower;Medial  Staging: Deep Tissue Pressure Injury - Purple or maroon localized area of discolored intact skin or blood-filled blister due to damage of underlying soft tissue from pressure and/or shear.  Wound Description (Comments):   Present on Admission: Yes     Pressure Injury 09/27/20 Sacrum Mid Stage 1 -  Intact skin with non-blanchable redness of a localized area usually over a bony prominence. (Active)  09/27/20 0800  Location: Sacrum  Location Orientation: Mid  Staging: Stage 1 -  Intact skin with non-blanchable redness of a localized area usually over a bony prominence.  Wound Description (Comments):   Present on Admission:          DVT prophylaxis: Lovenox Code Status: Full Family Communication: Updated patient.  No family at bedside. Disposition:   Status is: Inpatient  Remains inpatient appropriate because:Inpatient level of care appropriate due to severity of illness  Dispo: The patient is from: Home              Anticipated d/c is to: Home              Patient currently is not medically stable to d/c.   Difficult to place patient No       Consultants:  Oncology: Dr. Marin Olp 09/21/2020 ID: Dr. Baxter Flattery 09/21/2020 PCCM: Dr. Chase Caller 09/24/2020,    Procedures:  Mechanical ventilation  CT chest 09/20/2020 CT angiogram chest 09/25/2020 Right IJ vein Port-A-Cath explantation per IR, Dr. Laurence Ferrari  09/22/2020 Right upper quadrant abdominal ultrasound 09/19/2020 2D echo 09/21/2020 Lower extremity Doppler 09/24/2020  Antimicrobials:  Anti-infectives (From admission, onward)    Start     Dose/Rate Route Frequency Ordered Stop   10/15/20 2200  vancomycin (VANCOREADY) IVPB 1500 mg/300 mL        1,500 mg 150 mL/hr over 120 Minutes Intravenous Every 24 hours 10/15/20 1219 11/04/20 2159   09/23/20 0800  ceFEPIme (MAXIPIME) 2 g in sodium chloride 0.9 % 100 mL IVPB  Status:  Discontinued        2 g 200 mL/hr over 30 Minutes Intravenous Every 8 hours 09/23/20 0738 09/23/20 1758   09/21/20 0400  vancomycin (VANCOREADY) IVPB 750 mg/150 mL  Status:  Discontinued        750 mg 150 mL/hr over 60 Minutes Intravenous Every 12 hours 09/20/20 1715 10/15/20 1219   09/20/20 1730  vancomycin (VANCOREADY) IVPB 750 mg/150 mL        750 mg 150 mL/hr over 60 Minutes Intravenous STAT 09/20/20 1714 09/20/20 1954   09/20/20 0400  vancomycin (VANCOREADY) IVPB 750 mg/150 mL  Status:  Discontinued        750 mg 150 mL/hr over 60 Minutes Intravenous Every 12 hours 09/19/20 1932 09/20/20 1155   09/20/20 0000  ceFEPIme (MAXIPIME) 2 g in sodium chloride 0.9 % 100 mL IVPB  Status:  Discontinued  2 g 200 mL/hr over 30 Minutes Intravenous Every 8 hours 09/19/20 1932 09/23/20 0721   09/19/20 1600  ceFEPIme (MAXIPIME) 2 g in sodium chloride 0.9 % 100 mL IVPB        2 g 200 mL/hr over 30 Minutes Intravenous  Once 09/19/20 1548 09/19/20 1634   09/19/20 1600  vancomycin (VANCOREADY) IVPB 1250 mg/250 mL        1,250 mg 166.7 mL/hr over 90 Minutes Intravenous STAT 09/19/20 1557 09/19/20 1822         Subjective: Patient sleeping but arousable.  Denies any chest pain.  No worsening shortness of breath.  No abdominal pain.  Tolerating oral intake as well as tube feeds at night.  Objective: Vitals:   10/17/20 0607 10/17/20 0700 10/17/20 0744 10/17/20 0800  BP: (!) 85/47     Pulse: 67 65    Resp: (!) 24 12     Temp:    98.7 F (37.1 C)  TempSrc:    Axillary  SpO2: 97% 100% 98%   Weight:      Height:        Intake/Output Summary (Last 24 hours) at 10/17/2020 0909 Last data filed at 10/17/2020 0422 Gross per 24 hour  Intake 2618.8 ml  Output 2000 ml  Net 618.8 ml    Filed Weights   09/19/20 1930 09/26/20 0258 09/30/20 1646  Weight: 63.3 kg 63.3 kg 69 kg    Examination:  General exam: Trach collar on. Respiratory system: CTA B anterior lung fields.  No wheezes, no crackles, no rhonchi.  Normal respiratory effort. Cardiovascular system: RRR no murmurs rubs or gallops.  No JVD.  No lower extremity edema.   Gastrointestinal system: Abdomen is soft, nontender, nondistended, positive bowel sounds.  PEG tube intact.  Central nervous system: Alert.  Moving extremities spontaneously.  No focal neurological deficits.   Extremities: Symmetric 5 x 5 power. Skin: No rashes, lesions or ulcers Psychiatry: Judgement and insight normal.  Mood and affect appropriate.     Data Reviewed: I have personally reviewed following labs and imaging studies  CBC: Recent Labs  Lab 10/11/20 0521 10/15/20 0520 10/16/20 0500  WBC 7.3 7.1 5.9  NEUTROABS  --   --  3.2  HGB 9.9* 9.3* 8.8*  HCT 34.4* 32.0* 29.9*  MCV 101.2* 98.5 98.7  PLT 125* 102* 95*     Basic Metabolic Panel: Recent Labs  Lab 10/13/20 0508 10/14/20 0500 10/15/20 0520 10/16/20 0500 10/17/20 0440  NA 144 141 142 141 140  K 4.1 4.4 4.8 4.6 4.7  CL 93* 92* 93* 93* 93*  CO2 44* 44* 42* 42* 42*  GLUCOSE 123* 152* 156* 149* 144*  BUN 32* 29* 27* 25* 27*  CREATININE 0.53* 0.38* 0.54* 0.57* 0.70  0.68  CALCIUM 10.8* 10.3 10.7* 10.8* 10.6*  PHOS 3.0 3.6 4.3 4.0 3.4     GFR: Estimated Creatinine Clearance: 80.1 mL/min (by C-G formula based on SCr of 0.68 mg/dL).  Liver Function Tests: Recent Labs  Lab 10/13/20 0508 10/14/20 0500 10/15/20 0520 10/16/20 0500 10/17/20 0440  ALBUMIN 2.9* 3.0* 2.9* 3.7 3.9     CBG: Recent  Labs  Lab 10/16/20 1546 10/16/20 1935 10/16/20 2257 10/17/20 0352 10/17/20 0806  GLUCAP 139* 128* 131* 176* 140*      No results found for this or any previous visit (from the past 240 hour(s)).       Radiology Studies: No results found.      Scheduled Meds:  chlorhexidine gluconate (  MEDLINE KIT)  15 mL Mouth Rinse BID   Chlorhexidine Gluconate Cloth  6 each Topical Q0600   cholecalciferol  10,000 Units Oral Daily   enoxaparin (LOVENOX) injection  40 mg Subcutaneous Q24H   feeding supplement (PROSource TF)  45 mL Per Tube Daily   free water  100 mL Per Tube Q6H   insulin aspart  0-9 Units Subcutaneous Q4H   insulin glargine  10 Units Subcutaneous QHS   mouth rinse  15 mL Mouth Rinse BID   midodrine  10 mg Oral TID WC   multivitamin with minerals  1 tablet Oral Daily   pantoprazole  40 mg Oral Daily   sertraline  100 mg Oral Daily   sodium chloride flush  10-40 mL Intracatheter Q12H   traZODone  100 mg Oral QHS   Continuous Infusions:  sodium chloride Stopped (10/15/20 2243)   feeding supplement (OSMOLITE 1.2 CAL) Stopped (10/17/20 0753)   vancomycin Stopped (10/16/20 2317)     LOS: 28 days    Time spent: 35 minutes    Irine Seal, MD [facility]   To contact the attending provider between 7A-7P or the covering provider during after hours 7P-7A, please log into the web site [email or web address] and access using universal [place] password for that web site. If you do not have the password, please call the hospital operator.  10/17/2020, 9:09 AM

## 2020-10-17 NOTE — Progress Notes (Signed)
Pt asked if wanted to get in the chair. Pt stated no. Asked if pt wanted to get up in chair later when dinner arrives.Pt refused. Asked if pt wanted to just stand up to get some movement. Pt refused. Will try again later.

## 2020-10-18 DIAGNOSIS — L89152 Pressure ulcer of sacral region, stage 2: Secondary | ICD-10-CM

## 2020-10-18 LAB — CBC
HCT: 32.3 % — ABNORMAL LOW (ref 39.0–52.0)
Hemoglobin: 9.7 g/dL — ABNORMAL LOW (ref 13.0–17.0)
MCH: 29 pg (ref 26.0–34.0)
MCHC: 30 g/dL (ref 30.0–36.0)
MCV: 96.7 fL (ref 80.0–100.0)
Platelets: 117 10*3/uL — ABNORMAL LOW (ref 150–400)
RBC: 3.34 MIL/uL — ABNORMAL LOW (ref 4.22–5.81)
RDW: 16.3 % — ABNORMAL HIGH (ref 11.5–15.5)
WBC: 6.7 10*3/uL (ref 4.0–10.5)
nRBC: 0 % (ref 0.0–0.2)

## 2020-10-18 LAB — GLUCOSE, CAPILLARY
Glucose-Capillary: 117 mg/dL — ABNORMAL HIGH (ref 70–99)
Glucose-Capillary: 120 mg/dL — ABNORMAL HIGH (ref 70–99)
Glucose-Capillary: 130 mg/dL — ABNORMAL HIGH (ref 70–99)
Glucose-Capillary: 140 mg/dL — ABNORMAL HIGH (ref 70–99)
Glucose-Capillary: 156 mg/dL — ABNORMAL HIGH (ref 70–99)
Glucose-Capillary: 182 mg/dL — ABNORMAL HIGH (ref 70–99)

## 2020-10-18 LAB — RENAL FUNCTION PANEL
Albumin: 3.9 g/dL (ref 3.5–5.0)
Anion gap: 7 (ref 5–15)
BUN: 27 mg/dL — ABNORMAL HIGH (ref 8–23)
CO2: 41 mmol/L — ABNORMAL HIGH (ref 22–32)
Calcium: 10.5 mg/dL — ABNORMAL HIGH (ref 8.9–10.3)
Chloride: 93 mmol/L — ABNORMAL LOW (ref 98–111)
Creatinine, Ser: 0.57 mg/dL — ABNORMAL LOW (ref 0.61–1.24)
GFR, Estimated: >60 mL/min (ref >60)
Glucose, Bld: 170 mg/dL — ABNORMAL HIGH (ref 70–99)
Phosphorus: 3.9 mg/dL (ref 2.5–4.6)
Potassium: 4.7 mmol/L (ref 3.5–5.1)
Sodium: 141 mmol/L (ref 135–145)

## 2020-10-18 NOTE — Progress Notes (Signed)
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  First negative blood cultures 09/22/20.  0n 09/24/20, patient developed respiratory distress with increased oxygen requirement and was transferred to ICU and started on pressure support ventilation.  Critical care and ID consulted.   ID recommended TEE to rule out infective endocarditis, however not stable to have TEE at this time per cardiology.    ID recommended 6 weeks of IV antibiotics from first negative blood cultures on 09/22/20, if unable to have TEE.  Cardiology was reconsulted to reassess if patient is stable for TEE on 6/28 however still having desaturation episodes and unstable for TEE..   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 -Patient noted to be chronically on 8 L via trach at baseline was placed on PS MV on 616 when patient had respiratory distress. -Patient noted to have needed vent management 10/11/2020 (overnight) currently on  10 L O2 with 60% FiO2 on trach with sats 96%. -It is noted that per wife patient does not have vent since December 2021, will need mechanical ventilation for intermittent use at home or nightly as needed. -PCCM following and managing trach/vent.. -Spoke with patient and patient stating currently NOT interested in going home with mechanical ventilation. -TOC was consulted for vent arrangements at home on discharge.  2.  Sepsis secondary to pneumonia, staph epidermis bacteremia, POA -Patient met criteria for sepsis on presentation with tachypnea, leukocytosis, tachycardia, chest x-ray concerning for recurrent pneumonia. -CT chest with interval increase in bulk of mediastinal and axillary adenopathy. -Blood cultures grew staph epidermis, Port-A-Cath removed 09/24/2020. -Patient noted to have been on cefepime from 09/19/2020>>> 09/23/2020. -Patient seen in consultation by ID who initially recommended TEE to exclude endocarditis however due to patient's respiratory status has been deemed unstable for TEE at this time. -ID recommended vancomycin for minimum of 6 weeks from date of negative culture on 09/22/2020, if TEE not done with stop date of 11/03/2020. -Sepsis physiology improved. -Follow.  3.  Hypotension -Status post IV albumin every 6 hours x1 day.  -Asymptomatic -Continue midodrine 10 mg 3 times daily.   4.  Chronic metabolic acidosis -Status post Diamox 500 mg p.o. x1 on 10/09/2020 for bicarb > 50. -Bicarb at 41.    5.  Relapsed Hodgkin's lymphoma -CT chest done concerning for worsening mediastinal and axillary lymphadenopathy. -Patient being followed by oncology, Dr. Marin Olp as well as palliative care medicine.  6.  Diabetes mellitus type 2 -Hemoglobin A1c 7.2 (09/18/2020)/ -CBG 182 this morning. -Continue Levemir, SSI.    7.  Normocytic anemia/anemia of chronic disease -Hemoglobin stable at 9.7.  8.  Hyponatremia, hypokalemia -Repleted.   9.  Severe protein calorie  malnutrition -Likely secondary to chronic illness and malignancy. -Status post IV albumin every 6 hours x1 day.  -Albumin level at 3.9 today.  -Continue tube feeds.  -Tolerating oral intake.   10.  History of depression, anxiety -Zoloft  -Outpatient follow-up.   11.  Pressure injury, POA Pressure Injury 08/16/20 Sacrum Lower;Medial Deep Tissue Pressure Injury - Purple or maroon localized area of discolored intact skin or blood-filled blister due to damage of underlying soft tissue from pressure and/or shear. (Active)  08/16/20 1200  Location: Sacrum  Location Orientation: Lower;Medial  Staging: Deep Tissue Pressure Injury - Purple or maroon localized area of discolored intact skin or blood-filled blister due to damage of underlying soft tissue from pressure and/or shear.  Wound Description (Comments):   Present on Admission: Yes     Pressure Injury 09/27/20 Sacrum Mid Stage 1 -  Intact skin with non-blanchable redness of a localized area usually over a bony prominence. (Active)  09/27/20 0800  Location: Sacrum  Location Orientation: Mid  Staging: Stage 1 -  Intact skin with non-blanchable redness of a localized area usually over a bony prominence.  Wound Description (Comments):   Present on Admission:          DVT prophylaxis: Lovenox Code Status: Full Family Communication: Updated patient.  No family at bedside. Disposition:   Status is: Inpatient  Remains inpatient appropriate because:Inpatient level of care appropriate due to severity of illness  Dispo: The patient is from: Home              Anticipated d/c is to: Home              Patient currently is not medically stable to d/c.   Difficult to place patient No       Consultants:  Oncology: Dr. Marin Olp 09/21/2020 ID: Dr. Baxter Flattery 09/21/2020 PCCM: Dr. Chase Caller 09/24/2020,    Procedures:  Mechanical ventilation  CT chest 09/20/2020 CT angiogram chest 09/25/2020 Right IJ vein Port-A-Cath explantation per IR,  Dr. Laurence Ferrari 09/22/2020 Right upper quadrant abdominal ultrasound 09/19/2020 2D echo 09/21/2020 Lower extremity Doppler 09/24/2020  Antimicrobials:  Anti-infectives (From admission, onward)    Start     Dose/Rate Route Frequency Ordered Stop   10/15/20 2200  vancomycin (VANCOREADY) IVPB 1500 mg/300 mL        1,500 mg 150 mL/hr over 120 Minutes Intravenous Every 24 hours 10/15/20 1219 11/04/20 2159   09/23/20 0800  ceFEPIme (MAXIPIME) 2 g in sodium chloride 0.9 % 100 mL IVPB  Status:  Discontinued        2 g 200 mL/hr over 30 Minutes Intravenous Every 8 hours 09/23/20 0738 09/23/20 1758   09/21/20 0400  vancomycin (VANCOREADY) IVPB 750 mg/150 mL  Status:  Discontinued        750 mg 150 mL/hr over 60 Minutes Intravenous Every 12 hours 09/20/20 1715 10/15/20 1219   09/20/20 1730  vancomycin (VANCOREADY) IVPB 750 mg/150 mL        750 mg 150 mL/hr over 60 Minutes Intravenous STAT 09/20/20 1714 09/20/20 1954   09/20/20 0400  vancomycin (VANCOREADY) IVPB 750 mg/150 mL  Status:  Discontinued        750 mg 150 mL/hr over 60 Minutes Intravenous Every 12 hours 09/19/20 1932 09/20/20 1155   09/20/20 0000  ceFEPIme (MAXIPIME) 2 g in sodium chloride 0.9 % 100 mL IVPB  Status:  Discontinued        2 g 200 mL/hr over 30 Minutes Intravenous Every 8 hours 09/19/20 1932 09/23/20 0721   09/19/20 1600  ceFEPIme (MAXIPIME) 2 g in sodium chloride 0.9 % 100 mL IVPB        2 g 200 mL/hr over 30 Minutes Intravenous  Once 09/19/20 1548 09/19/20 1634   09/19/20 1600  vancomycin (VANCOREADY) IVPB 1250 mg/250 mL        1,250 mg 166.7 mL/hr over 90 Minutes Intravenous STAT 09/19/20 1557 09/19/20 1822         Subjective: Sleeping but arousable.  Denies any chest pain.  No shortness of breath.  No abdominal pain.  Tolerating current diet.  Following commands appropriately.   Objective: Vitals:   10/18/20 0500 10/18/20 0600 10/18/20 0700 10/18/20 0807  BP: (!) 100/49 (!) 85/50 100/71   Pulse: 64 60 78    Resp: (!) 33 (!) 31 (!) 38   Temp:      TempSrc:      SpO2: 97% 100% 90% 98%  Weight:      Height:        Intake/Output Summary (Last 24 hours) at 10/18/2020 0936 Last data filed at 10/18/2020 3888 Gross per 24 hour  Intake 6127.66 ml  Output 2200 ml  Net 3927.66 ml    Filed Weights   09/19/20 1930 09/26/20 0258 09/30/20 1646  Weight: 63.3 kg 63.3 kg 69 kg    Examination:  General exam: Trach collar on. Respiratory system: Left basilar crackles otherwise lungs clear to auscultation anterior lung fields.  No wheezing.  No rhonchi.  Normal respiratory effort. Cardiovascular system: RRR no murmurs rubs or gallops.  No lower extremity edema Gastrointestinal system: Abdomen is soft, nontender, nondistended, positive bowel sounds.  No rebound.  No guarding.  PEG tube intact. Central nervous system: Alert and oriented.  Moving extremities spontaneously.  No focal neurological deficits.   Extremities: Symmetric 5 x 5 power. Skin: No rashes, lesions or ulcers Psychiatry: Judgement and insight normal.  Mood and affect appropriate.     Data Reviewed: I have personally reviewed following labs and imaging studies  CBC: Recent Labs  Lab 10/15/20 0520 10/16/20 0500 10/18/20 0425  WBC 7.1 5.9 6.7  NEUTROABS  --  3.2  --   HGB 9.3* 8.8* 9.7*  HCT 32.0* 29.9* 32.3*  MCV 98.5 98.7 96.7  PLT 102* 95* 117*     Basic Metabolic Panel: Recent Labs  Lab 10/14/20 0500 10/15/20 0520 10/16/20 0500 10/17/20 0440 10/18/20 0425  NA 141 142 141 140 141  K 4.4 4.8 4.6 4.7 4.7  CL 92* 93* 93* 93* 93*  CO2 44* 42* 42* 42* 41*  GLUCOSE 152* 156* 149* 144* 170*  BUN 29* 27* 25* 27* 27*  CREATININE 0.38* 0.54* 0.57* 0.70  0.68 0.57*  CALCIUM 10.3 10.7* 10.8* 10.6* 10.5*  PHOS 3.6 4.3 4.0 3.4 3.9     GFR: Estimated Creatinine Clearance: 80.1 mL/min (A) (by C-G formula based on SCr of 0.57 mg/dL (L)).  Liver Function Tests: Recent Labs  Lab 10/14/20 0500 10/15/20 0520  10/16/20 0500 10/17/20 0440 10/18/20 0425  ALBUMIN 3.0* 2.9* 3.7 3.9 3.9     CBG: Recent Labs  Lab 10/17/20 1543 10/17/20 1949 10/17/20 2308 10/18/20 0357 10/18/20 0751  GLUCAP 109* 174* 146* 182* 140*      No results found for this or any previous visit (from the past 240 hour(s)).       Radiology Studies:  No results found.      Scheduled Meds:  chlorhexidine gluconate (MEDLINE KIT)  15 mL Mouth Rinse BID   Chlorhexidine Gluconate Cloth  6 each Topical Q0600   cholecalciferol  10,000 Units Oral Daily   enoxaparin (LOVENOX) injection  40 mg Subcutaneous Q24H   feeding supplement (PROSource TF)  45 mL Per Tube Daily   free water  100 mL Per Tube Q6H   insulin aspart  0-9 Units Subcutaneous Q4H   insulin glargine  10 Units Subcutaneous QHS   mouth rinse  15 mL Mouth Rinse BID   midodrine  10 mg Oral TID WC   multivitamin with minerals  1 tablet Oral Daily   pantoprazole  40 mg Oral Daily   sertraline  100 mg Oral Daily   sodium chloride flush  10-40 mL Intracatheter Q12H   traZODone  100 mg Oral QHS   Continuous Infusions:  sodium chloride Stopped (10/15/20 2243)   feeding supplement (OSMOLITE 1.2 CAL) Stopped (10/18/20 4473)   vancomycin Stopped (10/18/20 0058)     LOS: 29 days    Time spent: 35 minutes    Irine Seal, MD [facility]   To contact the attending provider between 7A-7P or the covering provider during after hours 7P-7A, please log into the web site [email or web address] and access using universal [place] password for that web site. If you do not have the password, please call the hospital operator.  10/18/2020, 9:36 AM

## 2020-10-19 DIAGNOSIS — R7881 Bacteremia: Secondary | ICD-10-CM | POA: Diagnosis not present

## 2020-10-19 DIAGNOSIS — G620 Drug-induced polyneuropathy: Secondary | ICD-10-CM | POA: Diagnosis not present

## 2020-10-19 DIAGNOSIS — R627 Adult failure to thrive: Secondary | ICD-10-CM | POA: Diagnosis not present

## 2020-10-19 DIAGNOSIS — J9611 Chronic respiratory failure with hypoxia: Secondary | ICD-10-CM | POA: Diagnosis not present

## 2020-10-19 LAB — GLUCOSE, CAPILLARY
Glucose-Capillary: 167 mg/dL — ABNORMAL HIGH (ref 70–99)
Glucose-Capillary: 139 mg/dL — ABNORMAL HIGH (ref 70–99)
Glucose-Capillary: 121 mg/dL — ABNORMAL HIGH (ref 70–99)
Glucose-Capillary: 113 mg/dL — ABNORMAL HIGH (ref 70–99)
Glucose-Capillary: 159 mg/dL — ABNORMAL HIGH (ref 70–99)

## 2020-10-19 LAB — RENAL FUNCTION PANEL
Albumin: 3.8 g/dL (ref 3.5–5.0)
Anion gap: 7 (ref 5–15)
BUN: 29 mg/dL — ABNORMAL HIGH (ref 8–23)
CO2: 39 mmol/L — ABNORMAL HIGH (ref 22–32)
Calcium: 10.7 mg/dL — ABNORMAL HIGH (ref 8.9–10.3)
Chloride: 93 mmol/L — ABNORMAL LOW (ref 98–111)
Creatinine, Ser: 0.62 mg/dL (ref 0.61–1.24)
GFR, Estimated: >60 mL/min (ref >60)
Glucose, Bld: 156 mg/dL — ABNORMAL HIGH (ref 70–99)
Phosphorus: 4.2 mg/dL (ref 2.5–4.6)
Potassium: 4.7 mmol/L (ref 3.5–5.1)
Sodium: 139 mmol/L (ref 135–145)

## 2020-10-19 MED ORDER — CHLORHEXIDINE GLUCONATE CLOTH 2 % EX PADS
6.0000 | MEDICATED_PAD | Freq: Every day | CUTANEOUS | Status: DC
  Administered 2020-10-19 – 2020-10-24 (×6): 6 via TOPICAL

## 2020-10-19 NOTE — Progress Notes (Signed)
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  First negative blood cultures 09/22/20.  0n 09/24/20, patient developed respiratory distress with increased oxygen requirement and was transferred to ICU and started on pressure support ventilation.  Critical care and ID consulted.   ID recommended TEE to rule out infective endocarditis, however not stable to have TEE at this time per cardiology.    ID recommended 6 weeks of IV antibiotics from first negative blood cultures on 09/22/20, if unable to have TEE.  Cardiology was reconsulted to reassess if patient is stable for TEE on 6/28 however still having desaturation episodes and unstable for TEE..   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 -Patient noted to be chronically on 8 L via trach at baseline was placed on PS MV on 616 when patient had respiratory distress. -Patient noted to have needed vent management 10/11/2020 (overnight) currently on  10 L O2 with 60% FiO2 on trach with sats 96%. -It is noted that per wife patient does not have vent since December 2021, will need mechanical ventilation for intermittent use at home or nightly as needed. -PCCM following and managing trach/vent.. -Spoke with patient and patient stating currently NOT interested in going home with mechanical ventilation. -TOC was consulted for vent arrangements at home on discharge. -Per PCCM.  2.  Sepsis secondary to pneumonia, staph epidermis bacteremia, POA -Patient met criteria for sepsis on presentation with tachypnea, leukocytosis, tachycardia, chest x-ray concerning for recurrent pneumonia. -CT chest with interval increase in bulk of mediastinal and axillary adenopathy. -Blood cultures grew staph epidermis, Port-A-Cath removed 09/24/2020. -Patient noted to have been on cefepime from 09/19/2020>>> 09/23/2020. -Patient seen in consultation by ID who initially recommended TEE to exclude endocarditis however due to patient's respiratory status has been deemed unstable for TEE at this time. -ID recommended vancomycin for minimum of 6 weeks from date of negative culture on 09/22/2020, if TEE not done with stop date of 11/03/2020. -Sepsis physiology improved. -Follow.  3.  Hypotension -Status post IV albumin every 6 hours x1 day.  -Asymptomatic. -BP seem to have improved today. -Continue midodrine 10 mg 3 times daily.   4.  Chronic metabolic acidosis -Status post Diamox 500 mg p.o. x1 (10/09/2020 ) for bicarb > 80. -Bicarb currently at 39.    5.  Relapsed Hodgkin's lymphoma -CT chest done concerning for worsening mediastinal and axillary lymphadenopathy. -Patient being followed by oncology, Dr. Marin Olp as well as palliative care medicine.  6.  Diabetes mellitus type 2 -Hemoglobin A1c 7.2 (09/18/2020)/ -CBG 167.   -  Continue Lantus.  SSI.   7.  Normocytic anemia/anemia of chronic disease -Hemoglobin at 9.7.   8.  Hyponatremia, hypokalemia -Repleted.    9.  Severe protein calorie malnutrition -Likely secondary to chronic illness and malignancy. -Status post IV albumin every 6 hours x1 day.  -Albumin level at 3.8.   -Continue current diet.   -Continue tube feeds.    10.  History of depression, anxiety -Continue Zoloft.   -Outpatient follow up.  11.  Pressure injury, POA Pressure Injury 08/16/20 Sacrum Lower;Medial Deep Tissue Pressure Injury - Purple or maroon localized area of discolored intact skin or blood-filled blister due to damage of underlying soft tissue from pressure and/or shear. (Active)  08/16/20 1200  Location: Sacrum  Location Orientation: Lower;Medial  Staging: Deep Tissue Pressure Injury - Purple or maroon localized area of discolored intact skin or blood-filled blister due to damage of underlying soft tissue from pressure and/or shear.  Wound Description (Comments):   Present on Admission: Yes     Pressure Injury 09/27/20 Sacrum Mid Stage 1 -  Intact skin with non-blanchable redness of a localized area usually over a bony prominence. (Active)  09/27/20 0800  Location: Sacrum  Location Orientation: Mid  Staging: Stage 1 -  Intact skin with non-blanchable redness of a localized area usually over a bony prominence.  Wound Description (Comments):   Present on Admission:          DVT prophylaxis: Lovenox Code Status: Full Family Communication: Updated patient.  No family at bedside. Disposition:   Status is: Inpatient  Remains inpatient appropriate because:Inpatient level of care appropriate due to severity of illness  Dispo: The patient is from: Home              Anticipated d/c is to: Home              Patient currently is not medically stable to d/c.   Difficult to place patient No       Consultants:  Oncology: Dr. Marin Olp 09/21/2020 ID: Dr. Baxter Flattery 09/21/2020 PCCM: Dr. Chase Caller 09/24/2020,    Procedures:  Mechanical ventilation  CT chest 09/20/2020 CT angiogram chest 09/25/2020 Right IJ  vein Port-A-Cath explantation per IR, Dr. Laurence Ferrari 09/22/2020 Right upper quadrant abdominal ultrasound 09/19/2020 2D echo 09/21/2020 Lower extremity Doppler 09/24/2020  Antimicrobials:  Anti-infectives (From admission, onward)    Start     Dose/Rate Route Frequency Ordered Stop   10/15/20 2200  vancomycin (VANCOREADY) IVPB 1500 mg/300 mL        1,500 mg 150 mL/hr over 120 Minutes Intravenous Every 24 hours 10/15/20 1219 11/04/20 2159   09/23/20 0800  ceFEPIme (MAXIPIME) 2 g in sodium chloride 0.9 % 100 mL IVPB  Status:  Discontinued        2 g 200 mL/hr over 30 Minutes Intravenous Every 8 hours 09/23/20 0738 09/23/20 1758   09/21/20 0400  vancomycin (VANCOREADY) IVPB 750 mg/150 mL  Status:  Discontinued        750 mg 150 mL/hr over 60 Minutes Intravenous Every 12 hours 09/20/20 1715 10/15/20 1219   09/20/20 1730  vancomycin (VANCOREADY) IVPB 750 mg/150 mL        750 mg 150 mL/hr over 60 Minutes Intravenous STAT 09/20/20 1714 09/20/20 1954   09/20/20 0400  vancomycin (VANCOREADY) IVPB 750 mg/150 mL  Status:  Discontinued        750 mg 150 mL/hr over 60 Minutes Intravenous Every 12 hours 09/19/20 1932 09/20/20 1155   09/20/20 0000  ceFEPIme (MAXIPIME)  2 g in sodium chloride 0.9 % 100 mL IVPB  Status:  Discontinued        2 g 200 mL/hr over 30 Minutes Intravenous Every 8 hours 09/19/20 1932 09/23/20 0721   09/19/20 1600  ceFEPIme (MAXIPIME) 2 g in sodium chloride 0.9 % 100 mL IVPB        2 g 200 mL/hr over 30 Minutes Intravenous  Once 09/19/20 1548 09/19/20 1634   09/19/20 1600  vancomycin (VANCOREADY) IVPB 1250 mg/250 mL        1,250 mg 166.7 mL/hr over 90 Minutes Intravenous STAT 09/19/20 1557 09/19/20 1822         Subjective: Awake.  Alert.  Denies any chest pain.  No shortness of breath.  No abdominal pain.  Tolerating diet.    Objective: Vitals:   10/19/20 0700 10/19/20 0753 10/19/20 0800 10/19/20 0858  BP: (!) 109/55  130/65   Pulse: 70  80   Resp: (!) 31  (!) 34    Temp:      TempSrc:      SpO2: 92% 96% 92% 93%  Weight:      Height:        Intake/Output Summary (Last 24 hours) at 10/19/2020 0900 Last data filed at 10/19/2020 0600 Gross per 24 hour  Intake 0 ml  Output 2045 ml  Net -2045 ml    Filed Weights   09/19/20 1930 09/26/20 0258 09/30/20 1646  Weight: 63.3 kg 63.3 kg 69 kg    Examination:  General exam: Trach collar on. Respiratory system: Bilateral fine crackles noted.  No wheezing.  No rhonchi.  Fair air movement.  Cardiovascular system: Regular rate rhythm no murmurs rubs or gallops.  No JVD.  No lower extremity edema.  Gastrointestinal system: Abdomen is soft, nontender, nondistended, positive bowel sounds.  No rebound.  No guarding.  PEG tube intact.  Central nervous system: Alert and oriented.  Moving extremities spontaneously.  No focal neurological deficits.  Extremities: Symmetric 5 x 5 power. Skin: No rashes, lesions or ulcers Psychiatry: Judgement and insight normal.  Mood and affect appropriate.     Data Reviewed: I have personally reviewed following labs and imaging studies  CBC: Recent Labs  Lab 10/15/20 0520 10/16/20 0500 10/18/20 0425  WBC 7.1 5.9 6.7  NEUTROABS  --  3.2  --   HGB 9.3* 8.8* 9.7*  HCT 32.0* 29.9* 32.3*  MCV 98.5 98.7 96.7  PLT 102* 95* 117*     Basic Metabolic Panel: Recent Labs  Lab 10/15/20 0520 10/16/20 0500 10/17/20 0440 10/18/20 0425 10/19/20 0520  NA 142 141 140 141 139  K 4.8 4.6 4.7 4.7 4.7  CL 93* 93* 93* 93* 93*  CO2 42* 42* 42* 41* 39*  GLUCOSE 156* 149* 144* 170* 156*  BUN 27* 25* 27* 27* 29*  CREATININE 0.54* 0.57* 0.70  0.68 0.57* 0.62  CALCIUM 10.7* 10.8* 10.6* 10.5* 10.7*  PHOS 4.3 4.0 3.4 3.9 4.2     GFR: Estimated Creatinine Clearance: 80.1 mL/min (by C-G formula based on SCr of 0.62 mg/dL).  Liver Function Tests: Recent Labs  Lab 10/15/20 0520 10/16/20 0500 10/17/20 0440 10/18/20 0425 10/19/20 0520  ALBUMIN 2.9* 3.7 3.9 3.9 3.8      CBG: Recent Labs  Lab 10/18/20 1701 10/18/20 1955 10/18/20 2340 10/19/20 0308 10/19/20 0800  GLUCAP 117* 156* 120* 167* 139*      No results found for this or any previous visit (from the past 240 hour(s)).  Radiology Studies: No results found.      Scheduled Meds:  chlorhexidine gluconate (MEDLINE KIT)  15 mL Mouth Rinse BID   Chlorhexidine Gluconate Cloth  6 each Topical Q0600   cholecalciferol  10,000 Units Oral Daily   enoxaparin (LOVENOX) injection  40 mg Subcutaneous Q24H   feeding supplement (PROSource TF)  45 mL Per Tube Daily   free water  100 mL Per Tube Q6H   insulin aspart  0-9 Units Subcutaneous Q4H   insulin glargine  10 Units Subcutaneous QHS   mouth rinse  15 mL Mouth Rinse BID   midodrine  10 mg Oral TID WC   multivitamin with minerals  1 tablet Oral Daily   pantoprazole  40 mg Oral Daily   sertraline  100 mg Oral Daily   sodium chloride flush  10-40 mL Intracatheter Q12H   traZODone  100 mg Oral QHS   Continuous Infusions:  sodium chloride Stopped (10/15/20 2243)   feeding supplement (OSMOLITE 1.2 CAL) 1,000 mL (10/19/20 0527)   vancomycin Stopped (10/18/20 2342)     LOS: 30 days    Time spent: 35 minutes    Irine Seal, MD [facility]   To contact the attending provider between 7A-7P or the covering provider during after hours 7P-7A, please log into the web site [email or web address] and access using universal [place] password for that web site. If you do not have the password, please call the hospital operator.  10/19/2020, 9:00 AM

## 2020-10-19 NOTE — TOC Progression Note (Addendum)
Transition of Care [facility]) - Progression Note    Patient Details  Name: Steve [name] MRN: [id number] Date of Birth: [birth year]  [facility] CM/SW Contact  Ross Ludwig, [place] Phone Number: 10/19/2020, 1:05 PM  Clinical Narrative:     CSW was asked if LTACH can screen patient to see if he is appropriate.  CSW gave referral for [facility] and [facility] to screen patient.  Per physician, he tried to speak with patient's wife, and had to leave a message, awaiting for a call back.   Expected Discharge Plan: [place] Barriers to Discharge: Continued Medical Work up  Expected Discharge Plan and Services Expected Discharge Plan: [place] In-house Referral: Clinical Social Work Discharge Planning Services: CM Consult Post Acute Care Choice: [place] arrangements for the past 2 months: Mobile Home                 DME Arranged: N/A DME Agency: NA       HH Arranged: PT [place]: [place] ([place]) Date [place]: 09/22/20   Representative spoke with at [place]: [place] (SDOH) Interventions    Readmission Risk Interventions Readmission Risk Prevention Plan 09/22/2020 08/24/2020 07/22/2019  Transportation Screening Complete Complete Complete  Medication Review [employer]) Complete Complete Complete  HRI or Home Care Consult Complete Complete -  SW Recovery Care/Counseling Consult Complete Complete -  Palliative Care Screening Not Applicable Not Applicable -  [place] Not Applicable Not Applicable -  Some recent data might be hidden

## 2020-10-19 NOTE — Progress Notes (Signed)
NAME:  DACEN FRAYRE, MRN:  [id number], DOB:  10-07-1955, LOS: 96 ADMISSION DATE:  09/19/2020, CONSULTATION DATE:  6/16 REFERRING MD:  Kurtis Bushman, CHIEF COMPLAINT:  Dyspnea   History of Present Illness:  65 y/o male with an extensive history with [place] here at [facility] where he was diagnosed with COVID related ARDS in 2021 after he had been treated for lymphoma as an outpatient.  This lead to an 8 month long hospitalization.  He has been hospitalized multiple times since the incident event.  He had been started on lymphoma treatment again on 4/1 and 4/27, then was hospitalized on 5/5 with pneumonia.  His LFT's were elevated too.  He was eventually discharged on 5/16 on 40% FiO2 through trach collar and on prednisone 40mg  daily.  He returned on 6/11 with worsening malaise, fatigue, and respiratory failure.   He was found to have staph epi bacteremia and likely pneumonia. He went back on mechanical ventilator support on 6/11.  As of 6/27 he has remained off of mechanical ventilation.   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  He has progressive Hodgkin's disease on the CT scan of the chest.  He was unable to sustain facemask oxygen and has required being placed on pressure support ventilation through the tracheostomy.  He now has a cuffed 6 sized tracheostomy.  Currently on 50% oxygen and saturating well and more comfortable. 6/18 -stuck on pressure support ventilator at 40% FiO2.  Unable to wean off.  He is watching TV and is able to eat 6/22 slowly progressing PSV wean 6/24 tolerating ATC 6/25 tolerated ATC for about 1 and half hours 6/26 very short tolerance of ATC, back on vent quickly due to dyspnea 6/27  off vent all day and night 7/5 TC continuous  Interim History / Subjective:  Feels OK Says that it is too hard for him to walk more than 2-3 steps without support: mostly due to leg strength but also due to dyspnea Now on 8 L per tracheostomy collar  Objective   Blood pressure (!) 97/59, pulse 75, temperature 97.6 F (36.4 C), temperature source Oral, resp. rate (!) 29, height 5\' 5"  (1.651 m), weight 69 kg, SpO2 94 %.    FiO2 (%):  [40 %-60 %] 40 %   Intake/Output Summary (Last 24 hours) at 10/19/2020 0719 Last data filed at 10/19/2020 0600 Gross per 24 hour  Intake 0 ml  Output 2045 ml  Net -2045 ml   Filed Weights   09/19/20 1930 09/26/20 0258 09/30/20 1646  Weight: 63.3 kg 63.3 kg 69 kg    Examination: General:  Chronically ill appering, resting in bed with mild tachypnea but no accessory muscle use HENT: NCAT OP clear, tracheostomy site clear PULM: Coarse crackles 2/3 of the way up, normal effort CV: RRR, no mgr GI: BS+, soft, nontender MSK: diminished bulk and tone of legs bilaterally Neuro: awake, alert, lower extremity strength: dorsiflexion 4/5 strength, hip flexion 4/5  Resolved Hospital Problem list     Assessment & Plan:  Healthcare associated pneumonia> improved Chronic respiratory failure with hypoxemia and hypercarbia Post COVID severe, end stage pulmonary fibrosis Severe tracheobronchomalacia Severe protein calorie malnutrition Hodgkin Lymphoma Severe physical deconditioning Staph epi bacteremia  Discussion: Since  admission Court's condition has improved to the point that he is able to breathe without the assistance of mechanical ventilation.  However to get to this point he has required ICU level nursing for 31 days in a row and he remains very fragile with hypercarbia, hypoxemia, and significant pulmonary toilette needs.  He will not tolerate being home without a home ventilator.  He will return to the hospital gain with pneumonia at some point.  He is  more deconditioned from a muscular standpoint than he was 8-10 months ago.  He is too weak and too sick chronically to receive cancer treatment.  Plan: He needs a home ventilator at night, work on this with case management Continue pulmonary toilette measures Continue tracheostomy care per routine> do not change to cuffless Continue nutrition: encourage as much po intake as possible in addition to tube feeding Continue PT efforts Continue antibiotics as per primary service Continue to discuss goals of care with he and his wife.  He is not a candidate for lung transplant.  Similiarly, he would not tolerate further cancer treatment considering his frail state.   Best Practice (right click and "Reselect all SmartList Selections" daily)   Diet/type: Regular consistency (see orders) DVT prophylaxis: LMWH GI prophylaxis: N/A Lines: Central line > still needed Foley:  N/A Code Status:  full code Last date of multidisciplinary goals of care discussion [7/10]  I attempted to call his wife, no answer, I left a message.  Labs   CBC: Recent Labs  Lab 10/15/20 0520 10/16/20 0500 10/18/20 0425  WBC 7.1 5.9 6.7  NEUTROABS  --  3.2  --   HGB 9.3* 8.8* 9.7*  HCT 32.0* 29.9* 32.3*  MCV 98.5 98.7 96.7  PLT 102* 95* 117*    Basic Metabolic Panel: Recent Labs  Lab 10/15/20 0520 10/16/20 0500 10/17/20 0440 10/18/20 0425 10/19/20 0520  NA 142 141 140 141 139  K 4.8 4.6 4.7 4.7 4.7  CL 93* 93* 93* 93* 93*  CO2 42* 42* 42* 41* 39*  GLUCOSE 156* 149* 144* 170* 156*  BUN 27* 25* 27* 27* 29*  CREATININE 0.54* 0.57* 0.70  0.68 0.57* 0.62  CALCIUM 10.7* 10.8* 10.6* 10.5* 10.7*  PHOS 4.3 4.0 3.4 3.9 4.2   GFR: Estimated Creatinine Clearance: 80.1 mL/min (by C-G formula based on SCr of 0.62 mg/dL). Recent Labs  Lab 10/15/20 0520 10/16/20 0500 10/18/20 0425  WBC 7.1 5.9 6.7    Liver Function Tests: Recent Labs  Lab 10/15/20 0520 10/16/20 0500 10/17/20 0440 10/18/20 0425  10/19/20 0520  ALBUMIN 2.9* 3.7 3.9 3.9 3.8   No results for input(s): LIPASE, AMYLASE in the last 168 hours. No results for input(s): AMMONIA in the last 168 hours.  ABG    Component Value Date/Time   PHART 7.318 (L) 10/14/2020 1146   PCO2ART 88.7 (HH) 10/14/2020 1146   PO2ART 114 (H) 10/14/2020 1146   HCO3 44.2 (H) 10/14/2020 1146   TCO2 48 (H) 08/16/2020 1600   O2SAT 98.6 10/14/2020 1146     Coagulation Profile: No results for input(s): INR, PROTIME in the last 168 hours.  Cardiac Enzymes: No results for input(s): CKTOTAL, CKMB, CKMBINDEX, TROPONINI in the last 168 hours.  HbA1C: Hgb A1c MFr Bld  Date/Time Value Ref Range Status  09/18/2020 12:12 PM 7.2 (H) 4.6 - 6.5 % Final    Comment:    Glycemic Control Guidelines for People with Diabetes:Non Diabetic:  <6%Goal of Therapy: <7%Additional Action Suggested:  >8%   05/25/2020  05:42 PM 5.8 (H) 4.8 - 5.6 % Final    Comment:    (NOTE)         Prediabetes: 5.7 - 6.4         Diabetes: >6.4         Glycemic control for adults with diabetes: <7.0     CBG: Recent Labs  Lab 10/18/20 1203 10/18/20 1701 10/18/20 1955 10/18/20 2340 10/19/20 0308  GLUCAP 130* 117* 156* 120* 167*    Critical care time: no critical care time  > 35 minutes spent were spent on this visit review records, discussing with Drs. Harley Alto, meeting with patient, calling wife and leaving message    [name], MD [place] [facility] Pager: [phone] Cell: [phone] After 7:00 pm call Elink  [phone]

## 2020-10-20 ENCOUNTER — Other Ambulatory Visit: Payer: Self-pay | Admitting: Medical

## 2020-10-20 LAB — RENAL FUNCTION PANEL
Albumin: 3.8 g/dL (ref 3.5–5.0)
Anion gap: 6 (ref 5–15)
BUN: 30 mg/dL — ABNORMAL HIGH (ref 8–23)
CO2: 41 mmol/L — ABNORMAL HIGH (ref 22–32)
Calcium: 10.4 mg/dL — ABNORMAL HIGH (ref 8.9–10.3)
Chloride: 92 mmol/L — ABNORMAL LOW (ref 98–111)
Creatinine, Ser: 0.64 mg/dL (ref 0.61–1.24)
GFR, Estimated: >60 mL/min (ref >60)
Glucose, Bld: 126 mg/dL — ABNORMAL HIGH (ref 70–99)
Phosphorus: 4.5 mg/dL (ref 2.5–4.6)
Potassium: 4.6 mmol/L (ref 3.5–5.1)
Sodium: 139 mmol/L (ref 135–145)

## 2020-10-20 LAB — GLUCOSE, CAPILLARY
Glucose-Capillary: 126 mg/dL — ABNORMAL HIGH (ref 70–99)
Glucose-Capillary: 138 mg/dL — ABNORMAL HIGH (ref 70–99)
Glucose-Capillary: 156 mg/dL — ABNORMAL HIGH (ref 70–99)
Glucose-Capillary: 91 mg/dL (ref 70–99)
Glucose-Capillary: 131 mg/dL — ABNORMAL HIGH (ref 70–99)
Glucose-Capillary: 88 mg/dL (ref 70–99)

## 2020-10-20 LAB — CBC
HCT: 31.8 % — ABNORMAL LOW (ref 39.0–52.0)
Hemoglobin: 9.7 g/dL — ABNORMAL LOW (ref 13.0–17.0)
MCH: 29.4 pg (ref 26.0–34.0)
MCHC: 30.5 g/dL (ref 30.0–36.0)
MCV: 96.4 fL (ref 80.0–100.0)
Platelets: 145 10*3/uL — ABNORMAL LOW (ref 150–400)
RBC: 3.3 MIL/uL — ABNORMAL LOW (ref 4.22–5.81)
RDW: 16.5 % — ABNORMAL HIGH (ref 11.5–15.5)
WBC: 7.3 10*3/uL (ref 4.0–10.5)
nRBC: 0 % (ref 0.0–0.2)

## 2020-10-20 NOTE — Progress Notes (Signed)
[facility]  Attempted to call his wife to discuss LTACH vs home ventilator.  No answer.  [name], MD [place] [facility] Pager: [phone] Cell: [phone] After 7:00 pm call Elink  [phone]

## 2020-10-20 NOTE — Progress Notes (Signed)
PROGRESS NOTE    Steve [name]  JFH:[id number] DOB: Sep 04, 1955 DOA: 09/19/2020 PCP: [name], PA-C   Chief Complaint  Patient presents with   Hyperkalemia    Brief Narrative:  Briefly, 65 years old male with PMH significant for LHTDS-28 infection complicated by ARDS, pulmonary fibrosis / pneumonitis and chronic respiratory failure on 8 L via trach, PEG dependence, Hodgkin's lymphoma on chemo, type 2 diabetes, hypertension and GERD presented in the ED with generalized malaise, poor appetite and failure to thrive and was admitted for possible pneumonia and found to have a staph epidermidis bacteremia.  First negative blood cultures 09/22/20.  0n 09/24/20, patient developed respiratory distress with increased oxygen requirement and was transferred to ICU and started on pressure support ventilation.  Critical care and ID consulted.   ID recommended TEE to rule out infective endocarditis, however not stable to have TEE at this time per cardiology.    ID recommended 6 weeks of IV antibiotics from first negative blood cultures on 09/22/20, if unable to have TEE.  Cardiology was reconsulted to reassess if patient is stable for TEE on 6/28 however still having desaturation episodes and unstable for TEE..   Assessment & Plan:   Principal Problem:   Failure to thrive in adult Active Problems:   Pressure ulcer   Tracheostomy dependence ([name])   Chronic respiratory failure with hypoxia ([name])   Hodgkin's lymphoma ([place])   Essential hypertension   Bacteremia   Respiratory distress   1 acute on chronic respiratory failure with tracheostomy, with hypoxemia and hypercapnia, severe baseline ILD -Patient with history of tracheostomy due to post COVID-19 pulmonary fibrosis with UIP pattern. -Patient noted to be chronically on 8 L via trach at baseline was placed on PS MV on 616 when patient had respiratory distress. -Patient noted to have needed vent management 10/11/2020 (overnight) currently on  10 L O2 with 80% FiO2 on trach with sats 96%. -Patient noted to have been on 10 L with FiO2 of 40% with sats of 97% throughout the day yesterday. -It is noted that per wife patient does not have vent since December 2021, will need mechanical ventilation for intermittent use at home or nightly as needed. -PCCM following and managing trach/vent.. -Spoke with patient and initially stated he was not interested however after further discussion with PCCM and patient patient has changed his mind and is now agreeable to mechanical ventilation at home on discharge.  -TOC was consulted for vent arrangements at home on discharge. -Per PCCM.  2.  Sepsis secondary to pneumonia, staph epidermis bacteremia, POA -Patient met criteria for sepsis on presentation with tachypnea, leukocytosis, tachycardia, chest x-ray concerning for recurrent pneumonia. -CT chest with interval increase in bulk of mediastinal and axillary adenopathy. -Blood cultures grew staph epidermis, Port-A-Cath removed 09/24/2020. -Patient noted to have been on cefepime from 09/19/2020>>> 09/23/2020. -Patient seen in consultation by ID who initially recommended TEE to exclude endocarditis however due to patient's respiratory status has been deemed unstable for TEE at this time. -ID recommended vancomycin for minimum of 6 weeks from date of negative culture on 09/22/2020, if TEE not done with stop date of 11/03/2020. -Sepsis physiology improved. -Follow.  3.  Hypotension -Status post IV albumin every 6 hours x1 day.  -Asymptomatic. -BP seems to have improved.   -Continue midodrine 10 mg 3 times daily.  4.  Chronic metabolic acidosis -Status post Diamox 500 mg p.o. x1 (10/09/2020 ) for bicarb > 50. -Bicarb was slowly trending down but fluctuating currently at  55.  5.  Relapsed Hodgkin's lymphoma -CT chest done concerning for worsening mediastinal and axillary lymphadenopathy. -Patient being followed by oncology, Dr. Marin Olp as well as  palliative care medicine.  6.  Diabetes mellitus type 2 -Hemoglobin A1c 7.2 (09/18/2020)/ -CBG 138 this morning.   -Continue current regimen of Lantus.   -SSI.  7.  Normocytic anemia/anemia of chronic disease -Hemoglobin at 9.7.   8.  Hyponatremia, hypokalemia -Repleted.   9.  Severe protein calorie malnutrition -Likely secondary to chronic illness and malignancy. -Status post IV albumin every 6 hours x1 day.  -Albumin at 3.8.   -Continue tube feeds.   -Continue oral intake.   10.  History of depression, anxiety -Zoloft.   -Outpatient follow-up.   11.  Pressure injury, POA Pressure Injury 08/16/20 Sacrum Lower;Medial Deep Tissue Pressure Injury - Purple or maroon localized area of discolored intact skin or blood-filled blister due to damage of underlying soft tissue from pressure and/or shear. (Active)  08/16/20 1200  Location: Sacrum  Location Orientation: Lower;Medial  Staging: Deep Tissue Pressure Injury - Purple or maroon localized area of discolored intact skin or blood-filled blister due to damage of underlying soft tissue from pressure and/or shear.  Wound Description (Comments):   Present on Admission: Yes     Pressure Injury 09/27/20 Sacrum Mid Stage 1 -  Intact skin with non-blanchable redness of a localized area usually over a bony prominence. (Active)  09/27/20 0800  Location: Sacrum  Location Orientation: Mid  Staging: Stage 1 -  Intact skin with non-blanchable redness of a localized area usually over a bony prominence.  Wound Description (Comments):   Present on Admission:          DVT prophylaxis: Lovenox Code Status: Full Family Communication: Updated patient.  No family at bedside. Disposition:   Status is: Inpatient  Remains inpatient appropriate because:Inpatient level of care appropriate due to severity of illness  Dispo: The patient is from: Home              Anticipated d/c is to: Home with vent versus LTAC              Patient currently  is not medically stable to d/c.   Difficult to place patient No       Consultants:  Oncology: Dr. Marin Olp 09/21/2020 ID: Dr. Baxter Flattery 09/21/2020 PCCM: Dr. Chase Caller 09/24/2020,    Procedures:  Mechanical ventilation  CT chest 09/20/2020 CT angiogram chest 09/25/2020 Right IJ vein Port-A-Cath explantation per IR, Dr. Laurence Ferrari 09/22/2020 Right upper quadrant abdominal ultrasound 09/19/2020 2D echo 09/21/2020 Lower extremity Doppler 09/24/2020  Antimicrobials:  Anti-infectives (From admission, onward)    Start     Dose/Rate Route Frequency Ordered Stop   10/15/20 2200  vancomycin (VANCOREADY) IVPB 1500 mg/300 mL        1,500 mg 150 mL/hr over 120 Minutes Intravenous Every 24 hours 10/15/20 1219 11/04/20 2159   09/23/20 0800  ceFEPIme (MAXIPIME) 2 g in sodium chloride 0.9 % 100 mL IVPB  Status:  Discontinued        2 g 200 mL/hr over 30 Minutes Intravenous Every 8 hours 09/23/20 0738 09/23/20 1758   09/21/20 0400  vancomycin (VANCOREADY) IVPB 750 mg/150 mL  Status:  Discontinued        750 mg 150 mL/hr over 60 Minutes Intravenous Every 12 hours 09/20/20 1715 10/15/20 1219   09/20/20 1730  vancomycin (VANCOREADY) IVPB 750 mg/150 mL        750 mg 150 mL/hr over 60  Minutes Intravenous STAT 09/20/20 1714 09/20/20 1954   09/20/20 0400  vancomycin (VANCOREADY) IVPB 750 mg/150 mL  Status:  Discontinued        750 mg 150 mL/hr over 60 Minutes Intravenous Every 12 hours 09/19/20 1932 09/20/20 1155   09/20/20 0000  ceFEPIme (MAXIPIME) 2 g in sodium chloride 0.9 % 100 mL IVPB  Status:  Discontinued        2 g 200 mL/hr over 30 Minutes Intravenous Every 8 hours 09/19/20 1932 09/23/20 0721   09/19/20 1600  ceFEPIme (MAXIPIME) 2 g in sodium chloride 0.9 % 100 mL IVPB        2 g 200 mL/hr over 30 Minutes Intravenous  Once 09/19/20 1548 09/19/20 1634   09/19/20 1600  vancomycin (VANCOREADY) IVPB 1250 mg/250 mL        1,250 mg 166.7 mL/hr over 90 Minutes Intravenous STAT 09/19/20 1557 09/19/20  1822         Subjective: Awake.  Alert.  No chest pain.  No shortness of breath.  No abdominal pain.  Tolerating diet.  Patient spoke with pulmonologist yesterday and is in agreement now for vent when he goes home stating pulmonologist is going to speak with his wife about it.     Objective: Vitals:   10/20/20 0400 10/20/20 0500 10/20/20 0758 10/20/20 0806  BP: (!) 137/59   126/79  Pulse: 76   88  Resp: 20   (!) 40  Temp: 98.5 F (36.9 C)  98.3 F (36.8 C)   TempSrc: Axillary  Oral   SpO2: 97%   90%  Weight:  63.6 kg    Height:        Intake/Output Summary (Last 24 hours) at 10/20/2020 0937 Last data filed at 10/20/2020 8828 Gross per 24 hour  Intake 20 ml  Output 1600 ml  Net -1580 ml    Filed Weights   09/30/20 1646 10/19/20 1413 10/20/20 0500  Weight: 69 kg 63.5 kg 63.6 kg    Examination:  General exam: Trach collar on. Respiratory system: Diffuse fine crackles noted.  No wheezing, no rhonchi.  Fair air movement.  Cardiovascular system: RRR no murmurs rubs or gallops.  No JVD.  No lower extremity edema.  Gastrointestinal system: Abdomen is soft, nontender, nondistended, positive bowel sounds.  No rebound.  No guarding.  PEG tube intact.   Central nervous system: Alert and oriented.  No focal neurological deficits.  Extremities: Symmetric 5 x 5 power. Skin: No rashes, lesions or ulcers Psychiatry: Judgement and insight normal.  Mood and affect appropriate.     Data Reviewed: I have personally reviewed following labs and imaging studies  CBC: Recent Labs  Lab 10/15/20 0520 10/16/20 0500 10/18/20 0425  WBC 7.1 5.9 6.7  NEUTROABS  --  3.2  --   HGB 9.3* 8.8* 9.7*  HCT 32.0* 29.9* 32.3*  MCV 98.5 98.7 96.7  PLT 102* 95* 117*     Basic Metabolic Panel: Recent Labs  Lab 10/16/20 0500 10/17/20 0440 10/18/20 0425 10/19/20 0520 10/20/20 0523  NA 141 140 141 139 139  K 4.6 4.7 4.7 4.7 4.6  CL 93* 93* 93* 93* 92*  CO2 42* 42* 41* 39* 41*  GLUCOSE  149* 144* 170* 156* 126*  BUN 25* 27* 27* 29* 30*  CREATININE 0.57* 0.70  0.68 0.57* 0.62 0.64  CALCIUM 10.8* 10.6* 10.5* 10.7* 10.4*  PHOS 4.0 3.4 3.9 4.2 4.5     GFR: Estimated Creatinine Clearance: 80.1 mL/min (by C-G formula  based on SCr of 0.64 mg/dL).  Liver Function Tests: Recent Labs  Lab 10/16/20 0500 10/17/20 0440 10/18/20 0425 10/19/20 0520 10/20/20 0523  ALBUMIN 3.7 3.9 3.9 3.8 3.8     CBG: Recent Labs  Lab 10/19/20 1608 10/19/20 1939 10/19/20 2323 10/20/20 0350 10/20/20 0753  GLUCAP 113* 159* 126* 138* 156*      No results found for this or any previous visit (from the past 240 hour(s)).       Radiology Studies: No results found.      Scheduled Meds:  chlorhexidine gluconate (MEDLINE KIT)  15 mL Mouth Rinse BID   Chlorhexidine Gluconate Cloth  6 each Topical Q0600   cholecalciferol  10,000 Units Oral Daily   enoxaparin (LOVENOX) injection  40 mg Subcutaneous Q24H   feeding supplement (PROSource TF)  45 mL Per Tube Daily   free water  100 mL Per Tube Q6H   insulin aspart  0-9 Units Subcutaneous Q4H   insulin glargine  10 Units Subcutaneous QHS   mouth rinse  15 mL Mouth Rinse BID   midodrine  10 mg Oral TID WC   multivitamin with minerals  1 tablet Oral Daily   pantoprazole  40 mg Oral Daily   sertraline  100 mg Oral Daily   sodium chloride flush  10-40 mL Intracatheter Q12H   traZODone  100 mg Oral QHS   Continuous Infusions:  sodium chloride Stopped (10/15/20 2243)   feeding supplement (OSMOLITE 1.2 CAL) 1,000 mL (10/20/20 0355)   vancomycin Stopped (10/20/20 0020)     LOS: 31 days    Time spent: 35 minutes    Irine Seal, MD [facility]   To contact the attending provider between 7A-7P or the covering provider during after hours 7P-7A, please log into the web site [email or web address] and access using universal [place] password for that web site. If you do not have the password, please call the hospital  operator.  10/20/2020, 9:37 AM

## 2020-10-20 NOTE — Progress Notes (Signed)
Nutrition Follow-up  DOCUMENTATION CODES:   Severe malnutrition in context of chronic illness  INTERVENTION:  - continue Osmolite 1.2 @ 80 ml/hr x18 hours/day with Prosource TF once/day and 100 ml free water QID.  - continue to encourage PO intakes of meals.   NUTRITION DIAGNOSIS:   Severe Malnutrition related to chronic illness, cancer and cancer related treatments as evidenced by severe fat depletion, severe muscle depletion. -ongoing  GOAL:   Patient will meet greater than or equal to 90% of their needs -met with TF and PO intakes  MONITOR:   TF tolerance, PO intake, Labs, Weight trends, Skin  ASSESSMENT:   65 year old Caucasian  male with medical history significant of chronic tracheostomy after prolonged hospitalization in 2021 related with pneumonia due to [id number] complicated by ARDS and pulmonary fibrosis +/-pneumonitis from brentuximab, s/p PEG, HTN, DM type II, Hodgkin's lymphoma on chemotherapy and GERD.  Patient was discharged from this hospital on 08/25/2020.  Patient was said to have done very well for the first 3 weeks.  Over the last 5 days, patient has been failing to thrive, with associated poor appetite and malaise.  Patient laying in bed with no family or visitors present. Most recently documented meal intakes were 80% of breakfast and 50% of lunch on 7/8 and 23% of breakfast and lunch on 7/9.   He reports eating 100% of scrambled eggs this AM. He was unable to eat the bacon on his plate, although he wanted to, because he did not have his dentures in.  PEG remains in place and is functioning well. He is receiving Osmolite 1.2 @ 80 ml/hr x18 hours/day (1500-0900) with Prosource TF once/day and 100 ml free water QID.   This regimen is providing 1768 kcal (91% kcal need), 91 grams protein, and 1581 ml free water.  Patient has greatly enjoyed having TF off for a few hours during the day and feels it has been beneficial to him being able to eat more. He would like to  continue this regimen/set up.   Weight has been stable over the past 30 days. No edema at this time.   Plan for d/c is for home on the vent vs LTACH.      Labs reviewed; CBGs: 138, 156, and 91 mg/dl, Cl: 92 mmol/l, BUN: 30 mg/dl, Ca: 10.4 mg/dl. Medications reviewed; 10000 units cholecalciferol/day, sliding scale novolog, 10 units lantus/day, 1 tablet multivitamin with minerals/day, 40 mg protonix/day.   Diet Order:   Diet Order             Diet Carb Modified Fluid consistency: Thin; Room service appropriate? Yes  Diet effective now                   EDUCATION NEEDS:   Education needs have been addressed  Skin:  Skin Assessment: Skin Integrity Issues: Skin Integrity Issues:: Stage I, Other (Comment) Stage I: sacrum (6/19) Other: MASD to mid-buttocks (newly documented today)  Last BM:  7/9 (type 4)  Height:   Ht Readings from Last 1 Encounters:  09/26/20 _0  (1.651 m)    Weight:   Wt Readings from Last 1 Encounters:  10/20/20 63.6 kg      Estimated Nutritional Needs:  Kcal:  1950-2150 Protein:  95-110g Fluid:  2L/day       Jarome Matin, MS, RD, LDN, CNSC Inpatient Clinical Dietitian RD pager # available in AMION  After hours/weekend pager # available in [facility]

## 2020-10-20 NOTE — TOC Progression Note (Signed)
Transition of Care [facility]) - Progression Note    Patient Details  Name: Steve [name] MRN: [id number] Date of Birth: October 24, 1955  [facility] CM/SW Contact  Ross Ludwig, [place] Phone Number: 10/20/2020, 1:45 PM  Clinical Narrative:     CSW spoke to patient's wife and discussed LTACH verse HH.  Per patient's wife she would rather have patient come home with home health services and a ventilator at night.  CSW updated LTACH of patient's choice.  [facility], and also using [facility] for equipment.  CSW updated [facility] that patient will need home ventilator.  Per Thedore Mins at [place], they will work with the family to make arrangements.  CSW continuing to follow.   Expected Discharge Plan: [place] Barriers to Discharge: Continued Medical Work up  Expected Discharge Plan and Services Expected Discharge Plan: [place] In-house Referral: Clinical Social Work Discharge Planning Services: CM Consult Post Acute Care Choice: [place] arrangements for the past 2 months: Mobile Home                 DME Arranged: N/A DME Agency: NA       HH Arranged: PT [place]: [place] ([place]) Date [place]: 09/22/20   Representative spoke with at [place]: [place] (SDOH) Interventions    Readmission Risk Interventions Readmission Risk Prevention Plan 09/22/2020 08/24/2020 07/22/2019  Transportation Screening Complete Complete Complete  Medication Review Press photographer) Complete Complete Complete  HRI or Home Care Consult Complete Complete -  SW Recovery Care/Counseling Consult Complete Complete -  Palliative Care Screening Not Applicable Not Applicable -  [place] Not Applicable Not Applicable -  Some recent data might be hidden

## 2020-10-20 NOTE — Progress Notes (Signed)
Physical Therapy Treatment Patient Details Name: Steve [name] MRN: [id number] DOB: 10-05-1955 Today's Date: 10/20/2020    History of Present Illness Patient  is a 65 year old male who was admitted for possible pneumonia and found to have a staph epidermidis bacteremia. PMH significant for [id number] infection complicated by ARDS, pulmonary fibrosis / pneumonitis and chronic respiratory failure on 8 L via trach at baseline, PEG dependence, Hodgkin's lymphoma on chemo, type 2 diabetes, hypertension and GERD presented in the ED with generalized malaise, poor appetite and failure to thrive.  0n 09/24/20, developed respiratory distress with increased oxygen requirement and was transferred to ICU and started on pressure support ventilation. Currently on trach collar on 10L fiO2 at 60% with intermittent vent support.    PT Comments    Patient continues to move well to sit up. 2 min assist to steady at Rw to take steps to recliner. SPO2 Dropped to 855, Back to 92 % with rest, noted $/4 Dyspnea with activity. Continue PT.  Follow Up Recommendations  No PT follow up     Equipment Recommendations  None recommended by PT    Recommendations for Other Services       Precautions / Restrictions Precautions Precaution Comments: trach collar at  on 60% fio2    Mobility  Bed Mobility   Bed Mobility: Supine to Sit     Supine to sit: Supervision;HOB elevated     General bed mobility comments: no physical assistance in/out of bed    Transfers Overall transfer level: Needs assistance Equipment used: Rolling walker (2 wheeled) Transfers: Sit to/from Stand Sit to Stand: Mod assist;+2 physical assistance;+2 safety/equipment Stand pivot transfers: Min assist       General transfer comment: able to rise from edge of bed, min G for safety and x2 for line management  Ambulation/Gait                 Stairs             Wheelchair Mobility    Modified Rankin (Stroke Patients  Only)       Balance Overall balance assessment: Needs assistance Sitting-balance support: Feet supported Sitting balance-Leahy Scale: Fair Sitting balance - Comments: cues to not use arms to prop   Standing balance support: Bilateral upper extremity supported Standing balance-Leahy Scale: Poor Standing balance comment: reliant on UE support on platform walker. patient stood for ~2 minutes before having to return to sitting and desat in O2                            Cognition Arousal/Alertness: Awake/alert Behavior During Therapy: [facility] for tasks assessed/performed                                          Exercises      General Comments        Pertinent Vitals/Pain Pain Assessment: No/denies pain    Home Living                      Prior Function            PT Goals (current goals can now be found in the care plan section) Progress towards PT goals: Progressing toward goals    Frequency    Min 3X/week      PT Plan Current plan remains  appropriate    Co-evaluation              AM-PAC PT "6 Clicks" Mobility   Outcome Measure  Help needed turning from your back to your side while in a flat bed without using bedrails?: A Little Help needed moving from lying on your back to sitting on the side of a flat bed without using bedrails?: A Little Help needed moving to and from a bed to a chair (including a wheelchair)?: A Little Help needed standing up from a chair using your arms (e.g., wheelchair or bedside chair)?: A Little Help needed to walk in hospital room?: A Lot Help needed climbing 3-5 steps with a railing? : Total 6 Click Score: 15    End of Session Equipment Utilized During Treatment: Gait belt Activity Tolerance: Patient tolerated treatment well;Treatment limited secondary to medical complications (Comment) Patient left: in chair;with call bell/phone within reach;with nursing/sitter in room Nurse  Communication: Mobility status PT Visit Diagnosis: Unsteadiness on feet (R26.81);Muscle weakness (generalized) (M62.81)     Time: 2423-5361 PT Time Calculation (min) (ACUTE ONLY): 19 min  Charges:  $Therapeutic Activity: 8-22 mins                     [name] PT Acute Rehabilitation Services Pager [phone]  Office [phone]    Claretha Cooper 10/20/2020, 1:33 PM

## 2020-10-21 DIAGNOSIS — R627 Adult failure to thrive: Secondary | ICD-10-CM | POA: Diagnosis not present

## 2020-10-21 LAB — RENAL FUNCTION PANEL
Albumin: 3.8 g/dL (ref 3.5–5.0)
Anion gap: 5 (ref 5–15)
BUN: 28 mg/dL — ABNORMAL HIGH (ref 8–23)
CO2: 41 mmol/L — ABNORMAL HIGH (ref 22–32)
Calcium: 10.6 mg/dL — ABNORMAL HIGH (ref 8.9–10.3)
Chloride: 94 mmol/L — ABNORMAL LOW (ref 98–111)
Creatinine, Ser: 0.61 mg/dL (ref 0.61–1.24)
GFR, Estimated: >60 mL/min (ref >60)
Glucose, Bld: 127 mg/dL — ABNORMAL HIGH (ref 70–99)
Phosphorus: 4.3 mg/dL (ref 2.5–4.6)
Potassium: 4.5 mmol/L (ref 3.5–5.1)
Sodium: 140 mmol/L (ref 135–145)

## 2020-10-21 LAB — CBC
HCT: 32.1 % — ABNORMAL LOW (ref 39.0–52.0)
Hemoglobin: 9.7 g/dL — ABNORMAL LOW (ref 13.0–17.0)
MCH: 29.3 pg (ref 26.0–34.0)
MCHC: 30.2 g/dL (ref 30.0–36.0)
MCV: 97 fL (ref 80.0–100.0)
Platelets: 143 10*3/uL — ABNORMAL LOW (ref 150–400)
RBC: 3.31 MIL/uL — ABNORMAL LOW (ref 4.22–5.81)
RDW: 16.4 % — ABNORMAL HIGH (ref 11.5–15.5)
WBC: 7.9 10*3/uL (ref 4.0–10.5)
nRBC: 0 % (ref 0.0–0.2)

## 2020-10-21 LAB — GLUCOSE, CAPILLARY
Glucose-Capillary: 110 mg/dL — ABNORMAL HIGH (ref 70–99)
Glucose-Capillary: 123 mg/dL — ABNORMAL HIGH (ref 70–99)
Glucose-Capillary: 136 mg/dL — ABNORMAL HIGH (ref 70–99)
Glucose-Capillary: 152 mg/dL — ABNORMAL HIGH (ref 70–99)
Glucose-Capillary: 169 mg/dL — ABNORMAL HIGH (ref 70–99)
Glucose-Capillary: 176 mg/dL — ABNORMAL HIGH (ref 70–99)
Glucose-Capillary: 84 mg/dL (ref 70–99)

## 2020-10-21 NOTE — Progress Notes (Signed)
Daily Progress Note   Patient Name: Steve [name]       Date: 10/21/2020 DOB: 08/08/1955  Age: 65 y.o. MRN#: [id number] Attending Physician: Georgette Shell, MD Primary Care Physician: Elise Benne Admit Date: 09/19/2020  Reason for Consultation/Follow-up: Establishing goals of care  Subjective: I discussed Steve [name] case with [name].    I followed up today with Steve [name] and his wife, Steve [name].  We discussed clinical course as well as wishes moving forward in regard to advanced directives and care plan this hospitalization.  Concepts specific to code status and rehospitalization discussed.  We discussed difference between a aggressive medical intervention path and a palliative, comfort focused care path.  Values and goals of care important to patient and family were attempted to be elicited.   Concept of Hospice and Palliative Care were discussed.   Questions and concerns addressed.   PMT will continue to support holistically.  See below.   Length of Stay: 32  Current Medications: Scheduled Meds:   chlorhexidine gluconate (MEDLINE KIT)  15 mL Mouth Rinse BID   Chlorhexidine Gluconate Cloth  6 each Topical Q0600   cholecalciferol  10,000 Units Oral Daily   enoxaparin (LOVENOX) injection  40 mg Subcutaneous Q24H   feeding supplement (PROSource TF)  45 mL Per Tube Daily   free water  100 mL Per Tube Q6H   insulin aspart  0-9 Units Subcutaneous Q4H   insulin glargine  10 Units Subcutaneous QHS   mouth rinse  15 mL Mouth Rinse BID   midodrine  10 mg Oral TID WC   multivitamin with minerals  1 tablet Oral Daily   pantoprazole  40 mg Oral Daily   sertraline  100 mg Oral Daily   sodium chloride flush  10-40 mL Intracatheter Q12H   traZODone  100 mg Oral QHS     Continuous Infusions:  sodium chloride 10 mL/hr (10/21/20 0800)   feeding supplement (OSMOLITE 1.2 CAL) 80 mL/hr at 10/21/20 1500   vancomycin Stopped (10/20/20 2337)    PRN Meds: acetaminophen, albuterol, guaiFENesin, hyoscyamine, LORazepam, sodium chloride flush  Physical Exam         General: Alert, awake, in no acute distress.   HEENT: Trach in place Heart: Regular rate and rhythm. No murmur appreciated. Lungs: Good air movement, clear, increased effort  Abdomen: Soft, nontender, nondistended, positive bowel sounds.   Ext: No significant edema Skin: Warm and dry Neuro: Grossly intact, nonfocal.   Vital Signs: BP (!) 110/51   Pulse 72   Temp 98.5 F (36.9 C) (Oral)   Resp (!) 30   Ht 5' 5"  (1.651 m)   Wt 63.2 kg   SpO2 99%   BMI 23.19 kg/m  SpO2: SpO2: 99 % O2 Device: O2 Device: Tracheostomy Collar O2 Flow Rate: O2 Flow Rate (L/min): 10 L/min  Intake/output summary:  Intake/Output Summary (Last 24 hours) at 10/21/2020 1652 Last data filed at 10/21/2020 1600 Gross per 24 hour  Intake 6672 ml  Output 1100 ml  Net 5572 ml   LBM: Last BM Date: 10/19/20 Baseline Weight: Weight: 63.3 kg Most recent weight: Weight: 63.2 kg       Palliative Assessment/Data:    Flowsheet Rows    Flowsheet Row Most Recent Value  Intake Tab   Referral Department Hospitalist  Unit at Time of Referral ICU  Palliative Care Primary Diagnosis Sepsis/Infectious Disease  Date Notified 09/25/20  Palliative Care Type Return patient Palliative Care  Reason for referral Clarify Goals of Care  Date of Admission 09/19/20  Date first seen by Palliative Care 09/26/20  # of days Palliative referral response time 1 Day(s)  # of days IP prior to Palliative referral 6  Clinical Assessment   Palliative Performance Scale Score 30%  Psychosocial & Spiritual Assessment   Palliative Care Outcomes   Patient/Family meeting held? Yes  Who was at the meeting? Patient, sister       Patient  Active Problem List   Diagnosis Date Noted   Respiratory distress    Bacteremia 09/23/2020   Failure to thrive in adult 09/19/2020   Sepsis due to pneumonia ([place]) 08/13/2020   Acute on chronic respiratory failure with hypoxia and hypercapnia ([place]) 07/21/2020   DM (diabetes mellitus), type 2 ([place]) 07/15/2020   Pneumonia 07/14/2020   Pulmonary fibrosis ([place]) 05/26/2020   Bronchiectasis ([place]) 05/26/2020   Acute hypercapnic respiratory failure ([place]) 05/26/2020   Acute on chronic respiratory failure with hypoxia ([place]) 05/25/2020   Uncontrolled type 2 diabetes mellitus with hyperglycemia, with long-term current use of insulin ([place]) 05/25/2020   Essential hypertension 05/25/2020   GERD without esophagitis 05/25/2020   Debility    Anxiety    Tracheostomy care ([place])    Pneumothorax on right 03/23/2020   Chronic respiratory failure with hypoxia ([name])    ARDS (adult respiratory distress syndrome) ([place])    Hodgkin's lymphoma ([place])    Pneumothorax, acute    Healthcare-associated pneumonia    COVID-19 virus infection    On mechanically assisted ventilation ([name])    Dysphagia    Pneumothorax    Subcutaneous air ([place])    Tracheostomy dependence ([place])    Pneumomediastinum ([place])    Acute respiratory distress syndrome (ARDS) due to COVID-19 virus ([place])    Pressure ulcer 07/27/2019   Acute respiratory failure with hypoxemia ([name])    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)    Malnutrition of moderate degree 07/10/2019   HCAP (healthcare-associated pneumonia)    Hypoxia    Febrile neutropenia ([place]) 05/17/2019   COVID-19 05/17/2019   Hyponatremia 05/17/2019   Protein-calorie malnutrition, severe ([place]) 05/17/2019   Neutropenic fever ([place]) 05/16/2019   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   Chemotherapy-induced diarrhea 04/22/2019   Anemia due to antineoplastic chemotherapy 03/25/2019   Hodgkin's lymphoma ([place]) 02/25/2019   Abnormal CT scan, pancreas -  tail area  01/31/2013   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   Septic arthritis of hip ([place]) 09/05/2012   Routine general medical examination at a health care facility 09/12/2011   Elbow pain 09/12/2011   Abscess of muscle 08/10/2011   H/O dental abscess 08/10/2011   Staphylococcus aureus bacteremia 07/27/2011   Abdominal pain, RLQ 09/02/2010   HYPOGONADISM, MALE 03/23/2007   SLEEP APNEA 03/23/2007    Palliative Care Assessment & Plan   Recommendations/Plan: Full code/Full scope- [place] endorses that he would want trial of CPR "one time."  We discussed that with his multiple comorbid conditions, his chance of surviving cardiac arrest requiring CPR would be very low.  We also discussed my concern that, even if he did survive CPR, it would not result in him achieving goal of being well enough to return home to spend meaningful time with his family.  He and Steve [name] will continue to discuss. He is hopeful to receive more chemotherapy.  We discussed concern that he may not be a candidate for further chemotherapy due to his recurrent pneumonia and the fact that he is once again ventilator dependent.  Both Dominica Severin and Steve [name] have a longstanding relationship with Dr. Marin Olp and trust his input greatly.  Steve [name] reports needing to have input from Dr. Marin Olp regarding potential for chemotherapy as both he and Steve [name] state that his decision making would likely change if he is not a candidate for further chemotherapy. He tells me that he had told [name] that he did not want to return to the hospital, however, his wife clarified with me that they have discussed this and he is planning to return to the hospital when he gets sick again in the future.  She stated that he was saying that he is hopeful not to have to return to the hospital, not that he would never want to return to the hospital.  He affirmed this with me. We discussed home hospice services and how they may be beneficial in meeting his goals moving  forward.  Steve [name] reports that he was on hospice services from July to December with [facility] and they are familiar with the care that home hospice provides.  Currently, they both endorse that this does not lined up with his desire for care moving forward.  This would, however, potentially change if he is not a candidate for further systemic therapy for his cancer.   Palliative care to continue to follow.  Goals of Care and Additional Recommendations: Limitations on Scope of Treatment: Full Scope Treatment  Code Status:    Code Status Orders  (From admission, onward)           Start     Ordered   09/19/20 1822  Full code  Continuous        09/19/20 1824           Code Status History     Date Active Date Inactive Code Status Order ID Comments User Context   08/13/2020 1818 08/25/2020 1609 Full Code [id number]  Norval Morton, MD ED   07/21/2020 0422 07/28/2020 1922 Full Code [id number]  Germain Osgood, PA-C ED   07/14/2020 1602 07/20/2020 1719 Full Code [id number]  Jonnie Finner, DO Inpatient   05/25/2020 2230 05/30/2020 0224 Full Code [id number]  Vernelle Emerald, MD ED   03/23/2020 1233 04/17/2020 2053 Full Code [id number]  Juanito Doom, MD Inpatient   08/28/2019 2100 10/31/2019 0223 Full Code [id number]  Maudry Diego Inpatient  07/08/2019 1852 08/28/2019 2001 Full Code [id number]  Johnsie Cancel, NP ED   05/17/2019 0443 05/22/2019 1728 Full Code [id number]  Steve [name] Gravel, MD Inpatient   11/30/2012 2139 12/02/2012 1725 Full Code [id number]  [name], MD Inpatient   09/05/2012 2245 09/08/2012 1509 Full Code [id number]  [name], MD Inpatient   07/24/2011 0116 08/01/2011 1827 Full Code [id number]  Theressa Millard, MD ED       Prognosis: Guarded  Discharge Planning: Home  Care plan was discussed with patient, wife, [name], bedside RN  Thank you for allowing the Palliative Medicine Team to assist in the care of this patient.   Time In: 1600 Time Out: 1650 Total  Time 50 Prolonged Time Billed No      Greater than 50%  of this time was spent counseling and coordinating care related to the above assessment and plan.  Micheline Rough, MD  Please contact Palliative Medicine Team phone at [phone] for questions and concerns.

## 2020-10-21 NOTE — Progress Notes (Addendum)
NAME:  Steve [name], MRN:  [id number], DOB:  03/24/1956, LOS: 64 ADMISSION DATE:  09/19/2020, CONSULTATION DATE:  6/16 REFERRING MD:  Steve [name], CHIEF COMPLAINT:  Dyspnea   History of Present Illness:  65 y/o male with an extensive history with [place] here at [facility] where he was diagnosed with COVID related ARDS in 2021 after he had been treated for lymphoma as an outpatient.  This lead to an 8 month long hospitalization.  He has been hospitalized multiple times since the incident event.  He had been started on lymphoma treatment again on 4/1 and 4/27, then was hospitalized on 5/5 with pneumonia.  His LFT's were elevated too.  He was eventually discharged on 5/16 on 40% FiO2 through trach collar and on prednisone 40mg  daily.  He returned on 6/11 with worsening malaise, fatigue, and respiratory failure.   He was found to have staph epi bacteremia and likely pneumonia. He went back on mechanical ventilator support on 6/11.  As of 6/27 he has remained off of mechanical ventilation.   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  He has progressive Hodgkin's disease on the CT scan of the chest.  He was unable to sustain facemask oxygen and has required being placed on pressure support ventilation through the tracheostomy.  He now has a cuffed 6 sized tracheostomy.  Currently on 50% oxygen and saturating well and more comfortable. 6/18 -stuck on pressure support ventilator at 40% FiO2.  Unable to wean off.  He is watching TV and is able to eat 6/22 slowly progressing PSV wean 6/24 tolerating ATC 6/25 tolerated ATC for about 1 and half hours 6/26 very short tolerance of ATC, back on vent quickly due to dyspnea 6/27  off vent all day and night 7/5 TC continuous  Interim History / Subjective:   Had increased chest congestion over night and some dyspnea Feels better after suctioning this morning Eating breakfast  Says he wants to go home and not come back to the hospital  Objective   Blood pressure (!) 104/59, pulse 62, temperature 98.9 F (37.2 C), temperature source Oral, resp. rate (!) 22, height 5\' 5"  (1.651 m), weight 63.2 kg, SpO2 96 %.    FiO2 (%):  [40 %-80 %] 40 %   Intake/Output Summary (Last 24 hours) at 10/21/2020 0718 Last data filed at 10/21/2020 0500 Gross per 24 hour  Intake 20 ml  Output 950 ml  Net -930 ml   Filed Weights   10/19/20 1413 10/20/20 0500 10/21/20 0500  Weight: 63.5 kg 63.6 kg 63.2 kg    Examination:  General:  increased work of breathing in bed, speaks in few word sentences due to dyspnea HENT: NCAT OP clear tracheostomy in place PULM: bilateral air entry, symmetric chest rise, increased effort CV: warm, well perfused GI: soft, nontender MSK: diminished bulk and tone Neuro: awake, alert,  MAEW,    Resolved Hospital Problem list     Assessment & Plan:  Healthcare associated pneumonia> improved Chronic respiratory failure with hypoxemia and hypercarbia Post COVID severe, end stage pulmonary fibrosis Severe tracheobronchomalacia Severe protein calorie malnutrition Hodgkin Lymphoma Severe physical deconditioning Staph epi bacteremia  Discussion: Steve [name] is ventilator dependent when not in a continuous ICU environment.  Therefore  he will need a ventilator at home.  He does not want to go to an [facility]ACH (he hated both [facility] and [facility]) and he does not want to return to the hospital.  I discussed code status with him and explained that CPR would cause rib fractures, pain and suffering and he would have more difficulty breathing if we were to do that.  He understands but asks me to "try one time, if it doesn't work then stop".  I also explained to his wife his  desire to not return to the hospital again, but she says "well if he's sick again in a month he'll come back".  They are planning for him to receive more cancer treatment. However given the fact that he has been hospitalized with pneumonia after the last several rounds of treatment and he is now ventilator dependent again that he should not receive any treatment which will diminish his body's ability to fight off infection.   It sounds as if the best approach which would be in line with [name]'s wishes would be to go home with hospice on a home ventilator where he can spend the most time with his wife and children as possible and not return to the hospital.  If he had a code status of DNR, then this would be more easily accomplished.    Plan: Consult palliative medicine again to discuss code status further and consider home hospice Ask oncology to discuss further cancer treatment with his family Set up home ventilator at night Continue pulmonary toilette measures Rest of care per [facility]    Best Practice (right click and "Reselect all SmartList Selections" daily)   Diet/type: Regular consistency (see orders) DVT prophylaxis: LMWH GI prophylaxis: N/A Lines: Central line > still needed Foley:  N/A Code Status:  full code Last date of multidisciplinary goals of care discussion [7/13]  See above  > 1 hour spent on today's visit discussing his care with him, his wife, and the palliative medicine teamn/a  Labs   CBC: Recent Labs  Lab 10/15/20 0520 10/16/20 0500 10/18/20 0425 10/20/20 1400 10/21/20 0527  WBC 7.1 5.9 6.7 7.3 7.9  NEUTROABS  --  3.2  --   --   --   HGB 9.3* 8.8* 9.7* 9.7* 9.7*  HCT 32.0* 29.9* 32.3* 31.8* 32.1*  MCV 98.5 98.7 96.7 96.4 97.0  PLT 102* 95* 117* 145* 143*    Basic Metabolic Panel: Recent Labs  Lab 10/17/20 0440 10/18/20 0425 10/19/20 0520 10/20/20 0523 10/21/20 0527  NA 140 141 139 139 140  K 4.7 4.7 4.7 4.6 4.5  CL 93* 93* 93* 92* 94*  CO2 42* 41*  39* 41* 41*  GLUCOSE 144* 170* 156* 126* 127*  BUN 27* 27* 29* 30* 28*  CREATININE 0.70  0.68 0.57* 0.62 0.64 0.61  CALCIUM 10.6* 10.5* 10.7* 10.4* 10.6*  PHOS 3.4 3.9 4.2 4.5 4.3   GFR: Estimated Creatinine Clearance: 80.1 mL/min (by C-G formula based on SCr of 0.61 mg/dL). Recent Labs  Lab 10/16/20 0500 10/18/20 0425 10/20/20 1400 10/21/20 0527  WBC 5.9 6.7 7.3 7.9    Liver Function Tests: Recent Labs  Lab 10/17/20 0440 10/18/20 0425 10/19/20 0520 10/20/20 0523 10/21/20 0527  ALBUMIN 3.9 3.9 3.8 3.8 3.8   No results for input(s): LIPASE, AMYLASE in the last 168 hours. No results for input(s): AMMONIA in the last 168 hours.  ABG    Component Value Date/Time   PHART 7.318 (L) 10/14/2020 1146   PCO2ART  88.7 (HH) 10/14/2020 1146   PO2ART 114 (H) 10/14/2020 1146   HCO3 44.2 (H) 10/14/2020 1146   TCO2 48 (H) 08/16/2020 1600   O2SAT 98.6 10/14/2020 1146     Coagulation Profile: No results for input(s): INR, PROTIME in the last 168 hours.  Cardiac Enzymes: No results for input(s): CKTOTAL, CKMB, CKMBINDEX, TROPONINI in the last 168 hours.  HbA1C: Hgb A1c MFr Bld  Date/Time Value Ref Range Status  09/18/2020 12:12 PM 7.2 (H) 4.6 - 6.5 % Final    Comment:    Glycemic Control Guidelines for People with Diabetes:Non Diabetic:  <6%Goal of Therapy: <7%Additional Action Suggested:  >8%   05/25/2020 05:42 PM 5.8 (H) 4.8 - 5.6 % Final    Comment:    (NOTE)         Prediabetes: 5.7 - 6.4         Diabetes: >6.4         Glycemic control for adults with diabetes: <7.0     CBG: Recent Labs  Lab 10/20/20 1223 10/20/20 1626 10/20/20 1924 10/21/20 0025 10/21/20 0304  GLUCAP 91 131* 88 136* 176*    Critical care time: n/a    [name], MD [place] [facility] Pager: [phone] Cell: [phone] After 7:00 pm call Elink  [phone]

## 2020-10-21 NOTE — Progress Notes (Signed)
PROGRESS NOTE    Steve [name]  VFM:[id number] DOB: 03/20/1956 DOA: 09/19/2020 PCP: [name], PA-C   Brief Narrative: 65 year old male with history of Hodgkin's lymphoma, type 2 diabetes, hypertension, GERD, prolonged [id number]-18 complicated by ARDS, chronic respiratory failure on 8 L of oxygen via trach, PEG dependence admitted with generalized malaise poor appetite failure to thrive and was found to have staph epidermis bacteremia.  TEE was unable to be done as patient was unstable.  ID recommended 6 weeks of IV antibiotics from the first negative blood culture which was on 09/22/2020. Assessment & Plan:   Principal Problem:   Failure to thrive in adult Active Problems:   Pressure ulcer   Tracheostomy dependence ([name])   Chronic respiratory failure with hypoxia ([name])   Hodgkin's lymphoma ([place])   Essential hypertension   Bacteremia   Respiratory distress   #1 acute on chronic respiratory failure with tracheostomy in the setting of interstitial lung disease and pulmonary fibrosis.  He is on 8 L of oxygen chronically at baseline.  Currently he is dependent on on Wednesday.  Patient and his wife does not want him to go to [facility] they want him to go home with triligy/vent. PCCM following for vent.  #2 sepsis secondary to staph epidermis bacteremia/pneumonia present on admission.  He met criteria for sepsis on admission with tachypnea tachycardia with recurrent pneumonia and leukocytosis.  CT of the chest revealed interval increase in bulk of mediastinal and axillary lymphadenopathy.  Port-A-Cath removed 09/24/2020.  ID recommended vancomycin for 6 weeks from date 09/22/2020.  #3 relapsed Hodgkin's lymphoma followed by Dr. Marin Olp.  4.  Type 2 diabetes A1c is 7.2.  Continue Lantus. CBG (last 3)  Recent Labs    10/21/20 0025 10/21/20 0304 10/21/20 0744  GLUCAP 136* 176* 152*    #5 hyponatremia and hypokalemia repleted.  #6 protein calorie malnutrition due to chronic illness and  malignancy status post IV albumin x1.  #7 history of depression anxiety on Zoloft.  #8 hypotension he received albumin x1.  On midodrine 10 mg 3 times a day.  #9 pressure injury on the sacrum present on admission see below  Pressure Injury 08/16/20 Sacrum Lower;Medial Deep Tissue Pressure Injury - Purple or maroon localized area of discolored intact skin or blood-filled blister due to damage of underlying soft tissue from pressure and/or shear. (Active)  08/16/20 1200  Location: Sacrum  Location Orientation: Lower;Medial  Staging: Deep Tissue Pressure Injury - Purple or maroon localized area of discolored intact skin or blood-filled blister due to damage of underlying soft tissue from pressure and/or shear.  Wound Description (Comments):   Present on Admission: Yes     Pressure Injury 09/27/20 Sacrum Mid Stage 1 -  Intact skin with non-blanchable redness of a localized area usually over a bony prominence. (Active)  09/27/20 0800  Location: Sacrum  Location Orientation: Mid  Staging: Stage 1 -  Intact skin with non-blanchable redness of a localized area usually over a bony prominence.  Wound Description (Comments):   Present on Admission:       Nutrition Problem: Severe Malnutrition Etiology: chronic illness, cancer and cancer related treatments     Signs/Symptoms: severe fat depletion, severe muscle depletion    Interventions: Tube feeding, Prostat, MVI  Estimated body mass index is 23.19 kg/m as calculated from the following:   Height as of this encounter: 5' 5"  (1.651 m).   Weight as of this encounter: 63.2 kg.  DVT prophylaxis: Lovenox  code Status: Full code  family Communication: None at bedside Disposition Plan:  Status is: Inpatient  Dispo: The patient is from: Home              Anticipated d/c is to: Home              Patient currently is not medically stable to d/c.   Difficult to place patient No   Consultants: Dr. Marin Olp, Dr. Graylon Good,  PCCM  Procedures: Right IJ Port-A-Cath per IR 09/22/2020  Antimicrobials:  Anti-infectives (From admission, onward)    Start     Dose/Rate Route Frequency Ordered Stop   10/15/20 2200  vancomycin (VANCOREADY) IVPB 1500 mg/300 mL        1,500 mg 150 mL/hr over 120 Minutes Intravenous Every 24 hours 10/15/20 1219 11/04/20 2159   09/23/20 0800  ceFEPIme (MAXIPIME) 2 g in sodium chloride 0.9 % 100 mL IVPB  Status:  Discontinued        2 g 200 mL/hr over 30 Minutes Intravenous Every 8 hours 09/23/20 0738 09/23/20 1758   09/21/20 0400  vancomycin (VANCOREADY) IVPB 750 mg/150 mL  Status:  Discontinued        750 mg 150 mL/hr over 60 Minutes Intravenous Every 12 hours 09/20/20 1715 10/15/20 1219   09/20/20 1730  vancomycin (VANCOREADY) IVPB 750 mg/150 mL        750 mg 150 mL/hr over 60 Minutes Intravenous STAT 09/20/20 1714 09/20/20 1954   09/20/20 0400  vancomycin (VANCOREADY) IVPB 750 mg/150 mL  Status:  Discontinued        750 mg 150 mL/hr over 60 Minutes Intravenous Every 12 hours 09/19/20 1932 09/20/20 1155   09/20/20 0000  ceFEPIme (MAXIPIME) 2 g in sodium chloride 0.9 % 100 mL IVPB  Status:  Discontinued        2 g 200 mL/hr over 30 Minutes Intravenous Every 8 hours 09/19/20 1932 09/23/20 0721   09/19/20 1600  ceFEPIme (MAXIPIME) 2 g in sodium chloride 0.9 % 100 mL IVPB        2 g 200 mL/hr over 30 Minutes Intravenous  Once 09/19/20 1548 09/19/20 1634   09/19/20 1600  vancomycin (VANCOREADY) IVPB 1250 mg/250 mL        1,250 mg 166.7 mL/hr over 90 Minutes Intravenous STAT 09/19/20 1557 09/19/20 1822         Subjective: Patient resting in bed able to communicate and answer questions and follow commands accordingly.  He is clear about not wanting to go to [facility] and wants to go home with home vent.  Objective: Vitals:   10/21/20 0500 10/21/20 0600 10/21/20 0700 10/21/20 0730  BP: (!) 94/56 (!) 104/49 (!) 95/47   Pulse: 88 87 81   Resp: (!) 31 (!) 37 (!) 33   Temp:    98.3 F  (36.8 C)  TempSrc:    Oral  SpO2: (!) 86% (!) 87% 93%   Weight: 63.2 kg     Height:        Intake/Output Summary (Last 24 hours) at 10/21/2020 0911 Last data filed at 10/21/2020 0815 Gross per 24 hour  Intake 2250 ml  Output 1250 ml  Net 1000 ml   Filed Weights   10/19/20 1413 10/20/20 0500 10/21/20 0500  Weight: 63.5 kg 63.6 kg 63.2 kg    Examination:  General exam: Appears calm and comfortable  Respiratory system: Few scattered rhonchi bilaterally respiratory effort normal. Cardiovascular system: S1 & S2 heard, RRR. No JVD, murmurs, rubs, gallops or clicks. No pedal  edema. Gastrointestinal system: Abdomen is nondistended, soft and nontender. No organomegaly or masses felt. Normal bowel sounds heard. Central nervous system: Alert and oriented. No focal neurological deficits. Extremities: Symmetric 5 x 5 power. Skin: No rashes, lesions or ulcers Psychiatry: Judgement and insight appear normal. Mood & affect appropriate.     Data Reviewed: I have personally reviewed following labs and imaging studies  CBC: Recent Labs  Lab 10/15/20 0520 10/16/20 0500 10/18/20 0425 10/20/20 1400 10/21/20 0527  WBC 7.1 5.9 6.7 7.3 7.9  NEUTROABS  --  3.2  --   --   --   HGB 9.3* 8.8* 9.7* 9.7* 9.7*  HCT 32.0* 29.9* 32.3* 31.8* 32.1*  MCV 98.5 98.7 96.7 96.4 97.0  PLT 102* 95* 117* 145* 937*   Basic Metabolic Panel: Recent Labs  Lab 10/17/20 0440 10/18/20 0425 10/19/20 0520 10/20/20 0523 10/21/20 0527  NA 140 141 139 139 140  K 4.7 4.7 4.7 4.6 4.5  CL 93* 93* 93* 92* 94*  CO2 42* 41* 39* 41* 41*  GLUCOSE 144* 170* 156* 126* 127*  BUN 27* 27* 29* 30* 28*  CREATININE 0.70  0.68 0.57* 0.62 0.64 0.61  CALCIUM 10.6* 10.5* 10.7* 10.4* 10.6*  PHOS 3.4 3.9 4.2 4.5 4.3   GFR: Estimated Creatinine Clearance: 80.1 mL/min (by C-G formula based on SCr of 0.61 mg/dL). Liver Function Tests: Recent Labs  Lab 10/17/20 0440 10/18/20 0425 10/19/20 0520 10/20/20 0523 10/21/20 0527   ALBUMIN 3.9 3.9 3.8 3.8 3.8   No results for input(s): LIPASE, AMYLASE in the last 168 hours. No results for input(s): AMMONIA in the last 168 hours. Coagulation Profile: No results for input(s): INR, PROTIME in the last 168 hours. Cardiac Enzymes: No results for input(s): CKTOTAL, CKMB, CKMBINDEX, TROPONINI in the last 168 hours. BNP (last 3 results) No results for input(s): PROBNP in the last 8760 hours. HbA1C: No results for input(s): HGBA1C in the last 72 hours. CBG: Recent Labs  Lab 10/20/20 1626 10/20/20 1924 10/21/20 0025 10/21/20 0304 10/21/20 0744  GLUCAP 131* 88 136* 176* 152*   Lipid Profile: No results for input(s): CHOL, HDL, LDLCALC, TRIG, CHOLHDL, LDLDIRECT in the last 72 hours. Thyroid Function Tests: No results for input(s): TSH, T4TOTAL, FREET4, T3FREE, THYROIDAB in the last 72 hours. Anemia Panel: No results for input(s): VITAMINB12, FOLATE, FERRITIN, TIBC, IRON, RETICCTPCT in the last 72 hours. Sepsis Labs: No results for input(s): PROCALCITON, LATICACIDVEN in the last 168 hours.  No results found for this or any previous visit (from the past 240 hour(s)).       Radiology Studies: No results found.      Scheduled Meds:  chlorhexidine gluconate (MEDLINE KIT)  15 mL Mouth Rinse BID   Chlorhexidine Gluconate Cloth  6 each Topical Q0600   cholecalciferol  10,000 Units Oral Daily   enoxaparin (LOVENOX) injection  40 mg Subcutaneous Q24H   feeding supplement (PROSource TF)  45 mL Per Tube Daily   free water  100 mL Per Tube Q6H   insulin aspart  0-9 Units Subcutaneous Q4H   insulin glargine  10 Units Subcutaneous QHS   mouth rinse  15 mL Mouth Rinse BID   midodrine  10 mg Oral TID WC   multivitamin with minerals  1 tablet Oral Daily   pantoprazole  40 mg Oral Daily   sertraline  100 mg Oral Daily   sodium chloride flush  10-40 mL Intracatheter Q12H   traZODone  100 mg Oral QHS   Continuous Infusions:  sodium chloride Stopped (10/15/20  2243)   feeding supplement (OSMOLITE 1.2 CAL) 1,000 mL (10/21/20 0300)   vancomycin Stopped (10/20/20 2337)     LOS: 32 days    Time spent: 66 min    Georgette Shell, MD 10/21/2020, 9:11 AM

## 2020-10-21 NOTE — Progress Notes (Signed)
Provided an introduction to Spiritual Care services.  Steve [name] and his wife reported that they are doing well and do not wish to talk further at this time.  [place], [place] Pager, [phone] 4:28 PM

## 2020-10-21 NOTE — Progress Notes (Signed)
Occupational Therapy Treatment Patient Details Name: Steve [name] MRN: [id number] DOB: 1956-01-01 Today's Date: 10/21/2020    History of present illness Patient  is a 65 year old male who was admitted for possible pneumonia and found to have a staph epidermidis bacteremia. PMH significant for UUVOZ-36 infection complicated by ARDS, pulmonary fibrosis / pneumonitis and chronic respiratory failure on 8 L via trach at baseline, PEG dependence, Hodgkin's lymphoma on chemo, type 2 diabetes, hypertension and GERD presented in the ED with generalized malaise, poor appetite and failure to thrive.  0n 09/24/20, developed respiratory distress with increased oxygen requirement and was transferred to ICU and started on pressure support ventilation. Currently on trach collar on 10L fiO2 at 60% with intermittent vent support.   OT comments  Steve [name] reports receiving "bad news" this morning and that "It's the end of the road." He is unsure on how he would like to proceed in regards to aggressiveness with therapy but he is willing to perform bed exercises. In conversation with patient in regards to needs for home we discussed use of BSC (he's been up to it once with nursing). He reports a limitation with standing and ambulating is ankle pain bilaterally with weight bearing. Patient does exhibit some tight heel cords and a tendency to stay plantarflexed. Therapist educated patient on how to perform stretching using theraband and a prolonged stretch with pillows at foot board and patient verbalized understanding. Patient performed UE exercises with orange band in supine. Vital signs WFL on 10 L trach collar at 98% fio2.     Follow Up Recommendations  Home health OT;Supervision/Assistance - 24 hour    Equipment Recommendations  3 in 1 bedside commode    Recommendations for Other Services      Precautions / Restrictions Precautions Precautions: Fall Precaution Comments: trach collar at  on 98% fio2 10  L Restrictions Weight Bearing Restrictions: No          Vision Patient Visual Report: No change from baseline            Cognition Arousal/Alertness: Awake/alert   Overall Cognitive Status: Within Functional Limits for tasks assessed                                          Exercises Other Exercises Other Exercises: with yellow band: shoulder flexion x 10 each arm , pull aparts bilaterally x 20, elbow flexion and extension x 10 each arm Other Exercises: KNee extension with yellow band each leg x 10   Shoulder Instructions       General Comments      Pertinent Vitals/ Pain       Pain Assessment: Faces Faces Pain Scale: Hurts a little bit Pain Location: bil ankles with dorsiflexion Pain Descriptors / Indicators: Aching;Grimacing Pain Intervention(s): Monitored during session   Frequency  Min 2X/week        Progress Toward Goals  OT Goals(current goals can now be found in the care plan section)  Progress towards OT goals: OT to reassess next treatment  Acute Rehab OT Goals Patient Stated Goal: walk to bathroom OT Goal Formulation: With patient Time For Goal Achievement: 10/29/20 Potential to Achieve Goals: Poor  Plan Discharge plan remains appropriate    Co-evaluation                 AM-PAC OT "6 Clicks" Daily Activity  Outcome Measure   Help from another person eating meals?: None   Help from another person toileting, which includes using toliet, bedpan, or urinal?: A Lot Help from another person bathing (including washing, rinsing, drying)?: A Lot Help from another person to put on and taking off regular upper body clothing?: A Little Help from another person to put on and taking off regular lower body clothing?: A Lot 6 Click Score: 13    End of Session Equipment Utilized During Treatment: Gait belt;Oxygen  OT Visit Diagnosis: Other abnormalities of gait and mobility (R26.89);Muscle weakness (generalized)  (M62.81)   Activity Tolerance Patient tolerated treatment well   Patient Left in bed;with call bell/phone within reach;with nursing/sitter in room   Nurse Communication  (okay to see per RN)        Time: 1038-1100 OT Time Calculation (min): 22 min  Charges: OT General Charges $OT Visit: 1 Visit OT Treatments $Therapeutic Exercise: 8-22 mins  Derl Barrow, OTR/L [place]  Office [phone] Pager: [place] 10/21/2020, 11:13 AM

## 2020-10-22 LAB — GLUCOSE, CAPILLARY
Glucose-Capillary: 145 mg/dL — ABNORMAL HIGH (ref 70–99)
Glucose-Capillary: 170 mg/dL — ABNORMAL HIGH (ref 70–99)
Glucose-Capillary: 98 mg/dL (ref 70–99)
Glucose-Capillary: 99 mg/dL (ref 70–99)
Glucose-Capillary: 101 mg/dL — ABNORMAL HIGH (ref 70–99)

## 2020-10-22 LAB — RENAL FUNCTION PANEL
Albumin: 3.7 g/dL (ref 3.5–5.0)
Anion gap: 7 (ref 5–15)
BUN: 26 mg/dL — ABNORMAL HIGH (ref 8–23)
CO2: 38 mmol/L — ABNORMAL HIGH (ref 22–32)
Calcium: 10.5 mg/dL — ABNORMAL HIGH (ref 8.9–10.3)
Chloride: 95 mmol/L — ABNORMAL LOW (ref 98–111)
Creatinine, Ser: 0.58 mg/dL — ABNORMAL LOW (ref 0.61–1.24)
GFR, Estimated: >60 mL/min (ref >60)
Glucose, Bld: 152 mg/dL — ABNORMAL HIGH (ref 70–99)
Phosphorus: 4 mg/dL (ref 2.5–4.6)
Potassium: 4.6 mmol/L (ref 3.5–5.1)
Sodium: 140 mmol/L (ref 135–145)

## 2020-10-22 LAB — VANCOMYCIN, TROUGH: Vancomycin Tr: 16 ug/mL (ref 15–20)

## 2020-10-22 NOTE — Progress Notes (Signed)
[facility] PCCM  Chart reviewed. Yesterday Mr. Kristiansen indicated to me that he did not want to return to the hospital again and wanted to find a plan to stay home even if and when he gets sick again.  However after that visit his wife came in and he changed his mind and now says he will return to the hospital.    [place]'s 3-6 month mortality is very poor.    He should not receive further cancer treatment.  Modern medicine has nothing to offer that will treat both his lymphoma and his severe pulmonary fibrosis and chronic respiratory failure without inflicting a significant amount of pain and suffering.  Therefore the best approach which gives him the best chance of longevity with the ability to visit with his family is going home on hospice with a home ventilator.  It seems he and his wife are not ready for that.  PCCM will sign off but we are happy to return and help with the conversation or manage his ventilator as needed.  [name], MD [place] PCCM Pager: [phone] Cell: [phone] After 7:00 pm call [place]  [phone]

## 2020-10-22 NOTE — TOC Progression Note (Signed)
Transition of Care [facility]) - Progression Note    Patient Details  Name: TIGHE GITTO MRN: [id number] Date of Birth: Jan 19, 1956  [facility] of [facility]) CM/SW Contact  Ross Ludwig, [place] Phone Number: 10/22/2020, 5:15 PM  Clinical Narrative:     Physician recommends that patient receive a Trilogy.  CSW spoke to [place] at [employer], he will work on ordering one for patient.  CSW to continue to follow patient's progress throughout discharge planning.   Expected Discharge Plan: [place] Barriers to Discharge: Continued Medical Work up  Expected Discharge Plan and Services Expected Discharge Plan: [place] In-house Referral: Clinical Social Work Discharge Planning Services: CM Consult Post Acute Care Choice: [place] arrangements for the past 2 months: Mobile Home                 DME Arranged: N/A DME Agency: NA       HH Arranged: PT [place]: [place] ([place]) Date [place]: 09/22/20   Representative spoke with at [place]: [place] (SDOH) Interventions    Readmission Risk Interventions Readmission Risk Prevention Plan 09/22/2020 08/24/2020 07/22/2019  Transportation Screening Complete Complete Complete  Medication Review Press photographer) Complete Complete Complete  HRI or Home Care Consult Complete Complete -  SW Recovery Care/Counseling Consult Complete Complete -  Palliative Care Screening Not Applicable Not Applicable -  [place] Not Applicable Not Applicable -  Some recent data might be hidden

## 2020-10-22 NOTE — Progress Notes (Signed)
Pharmacy Antibiotic Note  Steve [name] is a 65 y.o. male admitted on 09/19/2020 with possible pneumonia, found to have staph epidermidis bacteremia. Pharmacy has been consulted for Vancomycin dosing.  Today, 10/22/2020:   - SCr remains low/stable at 0.54 - WBC wnl  (steroids d/c on 6/24) - ID extended treatment: 6 weeks from first negative blood cultures on 09/22/20 since unable to have TEE  Plan: Check peak/trough tonight to calculate AUC Stop date 11/03/20. OPAT orders pended under discharge orders Will adjust OPAT orders if dose/schedule changes based on above  Height: 5\' 5"  (165.1 cm) Weight: 62.9 kg (138 lb 10.7 oz) IBW/kg (Calculated) : 61.5  Temp (24hrs), Avg:98 F (36.7 C), Min:97.5 F (36.4 C), Max:98.5 F (36.9 C)  Recent Labs  Lab 10/16/20 0500 10/17/20 0440 10/18/20 0425 10/19/20 0520 10/20/20 0523 10/20/20 1400 10/21/20 0527 10/22/20 0235  WBC 5.9  --  6.7  --   --  7.3 7.9  --   CREATININE 0.57*   < > 0.57* 0.62 0.64  --  0.61 0.58*   < > = values in this interval not displayed.     Estimated Creatinine Clearance: 80.1 mL/min (A) (by C-G formula based on SCr of 0.58 mg/dL (L)).    No Known Allergies  Antimicrobials this admission: 6/11 Vancomycin >> 6/12, resumed 6/12 >  (7/26) 6/11 Cefepime >> 6/15  Dose adjustments this admission: 6/14 0536 VP: 37, 1643 VT: 10 - AUC 527, continue Vanc 750mg  q12h  6/20 VP 1800 = 24; 6/21 VT 0330 = 12; AUC 456 7/1 0500 VT = 14, keep 750 q12 7/7 0521 VT = 20, adj Vanc to 1500mg  q24 7/14 VT - ___, 7/15 VP = ___, AUC = ___  Microbiology results: 6/11 BCx: 4/4 bottles Staph epi (methicillin resistance detected)  6/11 UCx: multiple species 6/11 Sputum:  needs collecting 6/11 Strep pneumo urinary antigen: neg 6/11 Legionella urinary antigen: neg 6/12 MRSA PCR: neg 6/14 BCx: NGF   Thank you for allowing pharmacy to be a part of this patient's care.  Peggyann Juba, PharmD, BCPS Pharmacy: [phone] 10/22/2020,  7:46 AM

## 2020-10-22 NOTE — Progress Notes (Signed)
Daily Progress Note   Patient Name: Steve [name]       Date: 10/22/2020 DOB: 03/21/1956  Age: 65 y.o. MRN#: [id number] Attending Physician: Georgette Shell, MD Primary Care Physician: Elise Benne Admit Date: 09/19/2020  Reason for Consultation/Follow-up: Establishing goals of care  Subjective: I met today with Steve [name] to follow-up on yesterday's conversations.  He was awake and alert and lying in bed in no distress.  We reviewed conversation from yesterday including his desire for further input from Dr. Marin [name] regarding potential for further chemotherapy prior to making any decisions about consideration for electing his hospice benefits at home.  We also discussed conversation from yesterday regarding CODE STATUS.  [name] tells me that he understands my concern that CPR is not likely to be beneficial in the event of cardiac arrest as it would not resolve the underlying issue that caused cardiac arrest in the first place nor would it be likely to result in him getting well enough to be home again.  He endorses he is likely more ready to accept this than his wife is at this point in time and he would like to maintain full CODE STATUS to allow more time to discuss this with his wife prior to making any changes to care plan.  Length of Stay: 33  Current Medications: Scheduled Meds:   chlorhexidine gluconate (MEDLINE KIT)  15 mL Mouth Rinse BID   Chlorhexidine Gluconate Cloth  6 each Topical Q0600   cholecalciferol  10,000 Units Oral Daily   enoxaparin (LOVENOX) injection  40 mg Subcutaneous Q24H   feeding supplement (PROSource TF)  45 mL Per Tube Daily   free water  100 mL Per Tube Q6H   insulin aspart  0-9 Units Subcutaneous Q4H   insulin glargine  10 Units Subcutaneous QHS    mouth rinse  15 mL Mouth Rinse BID   midodrine  10 mg Oral TID WC   multivitamin with minerals  1 tablet Oral Daily   pantoprazole  40 mg Oral Daily   sertraline  100 mg Oral Daily   sodium chloride flush  10-40 mL Intracatheter Q12H   traZODone  100 mg Oral QHS    Continuous Infusions:  sodium chloride 10 mL/hr (10/21/20 0800)   feeding supplement (OSMOLITE 1.2 CAL) 80 mL/hr at 10/21/20 1500  vancomycin Stopped (10/21/20 2320)    PRN Meds: acetaminophen, albuterol, guaiFENesin, hyoscyamine, LORazepam, sodium chloride flush  Physical Exam         General: Alert, awake, in no acute distress.   HEENT: Trach in place Heart: Regular rate and rhythm. No murmur appreciated. Lungs: Good air movement, clear, increased effort Abdomen: Soft, nontender, nondistended, positive bowel sounds.   Ext: No significant edema Skin: Warm and dry Neuro: Grossly intact, nonfocal.   Vital Signs: BP 115/63   Pulse 85   Temp 98 F (36.7 C) (Axillary)   Resp (!) 22   Ht 5' 5"  (1.651 m)   Wt 62.9 kg   SpO2 (!) 87%   BMI 23.08 kg/m  SpO2: SpO2: (!) 87 % O2 Device: O2 Device: Tracheostomy Collar O2 Flow Rate: O2 Flow Rate (L/min): 10 L/min  Intake/output summary:  Intake/Output Summary (Last 24 hours) at 10/22/2020 1417 Last data filed at 10/22/2020 0800 Gross per 24 hour  Intake 1833.33 ml  Output 1801 ml  Net 32.33 ml    LBM: Last BM Date: 10/21/20 Baseline Weight: Weight: 63.3 kg Most recent weight: Weight: 62.9 kg       Palliative Assessment/Data:    Flowsheet Rows    Flowsheet Row Most Recent Value  Intake Tab   Referral Department Hospitalist  Unit at Time of Referral ICU  Palliative Care Primary Diagnosis Sepsis/Infectious Disease  Date Notified 09/25/20  Palliative Care Type Return patient Palliative Care  Reason for referral Clarify Goals of Care  Date of Admission 09/19/20  Date first seen by Palliative Care 09/26/20  # of days Palliative referral response time 1  Day(s)  # of days IP prior to Palliative referral 6  Clinical Assessment   Palliative Performance Scale Score 30%  Psychosocial & Spiritual Assessment   Palliative Care Outcomes   Patient/Family meeting held? Yes  Who was at the meeting? Patient, sister       Patient Active Problem List   Diagnosis Date Noted   Respiratory distress    Bacteremia 09/23/2020   Failure to thrive in adult 09/19/2020   Sepsis due to pneumonia ([place]) 08/13/2020   Acute on chronic respiratory failure with hypoxia and hypercapnia ([place]) 07/21/2020   DM (diabetes mellitus), type 2 ([place]) 07/15/2020   Pneumonia 07/14/2020   Pulmonary fibrosis ([place]) 05/26/2020   Bronchiectasis ([place]) 05/26/2020   Acute hypercapnic respiratory failure ([place]) 05/26/2020   Acute on chronic respiratory failure with hypoxia ([place]) 05/25/2020   Uncontrolled type 2 diabetes mellitus with hyperglycemia, with long-term current use of insulin ([place]) 05/25/2020   Essential hypertension 05/25/2020   GERD without esophagitis 05/25/2020   Debility    Anxiety    Tracheostomy care ([place])    Pneumothorax on right 03/23/2020   Chronic respiratory failure with hypoxia ([name])    ARDS (adult respiratory distress syndrome) ([place])    Hodgkin's lymphoma ([place])    Pneumothorax, acute    Healthcare-associated pneumonia    COVID-19 virus infection    On mechanically assisted ventilation ([name])    Dysphagia    Pneumothorax    Subcutaneous air ([place])    Tracheostomy dependence ([place])    Pneumomediastinum ([place])    Acute respiratory distress syndrome (ARDS) due to COVID-19 virus ([place])    Pressure ulcer 07/27/2019   Acute respiratory failure with hypoxemia ([name])    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)    Malnutrition of moderate degree 07/10/2019   HCAP (healthcare-associated pneumonia)    Hypoxia  Febrile neutropenia ([place]) 05/17/2019   COVID-19 05/17/2019   Hyponatremia 05/17/2019   Protein-calorie malnutrition, severe ([place])  05/17/2019   Neutropenic fever ([place]) 05/16/2019   Chemotherapy-induced neuropathy ([place]) 04/22/2019   Chemotherapy-induced diarrhea 04/22/2019   Anemia due to antineoplastic chemotherapy 03/25/2019   Hodgkin's lymphoma ([place]) 02/25/2019   Abnormal CT scan, pancreas - tail area 01/31/2013   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   Septic arthritis of hip ([place]) 09/05/2012   Routine general medical examination at a health care facility 09/12/2011   Elbow pain 09/12/2011   Abscess of muscle 08/10/2011   H/O dental abscess 08/10/2011   Staphylococcus aureus bacteremia 07/27/2011   Abdominal pain, RLQ 09/02/2010   HYPOGONADISM, MALE 03/23/2007   SLEEP APNEA 03/23/2007    Palliative Care Assessment & Plan   Recommendations/Plan: Full code/Full scope- Steve [name] endorses that he would want trial of CPR "one time."  We discussed that with his multiple comorbid conditions, his chance of surviving cardiac arrest requiring CPR would be very low.  We also discussed my concern that, even if he did survive CPR, it would not result in him achieving goal of being well enough to return home to spend meaningful time with his family.  He and his wife will continue to discuss moving forward. He is hopeful to receive more chemotherapy.  We discussed concern that he may not be a candidate for further chemotherapy due to his recurrent pneumonia and the fact that he is once again ventilator dependent.  Both Steve [name] and Steve [name] have a longstanding relationship with Dr. Marin [name] and trust his input greatly.  Steve [name] reports need to have input from Dr. Marin [name] regarding potential for further chemotherapy as both he and Steve [name] state that his decision making would likely change if he is not a candidate for further chemotherapy. Steve [name] has had home hospice services in the past.  We reviewed home hospice services and how they may be beneficial in meeting his goals moving forward.  Steve [name] reports that he was on hospice services  from July to December with [facility] and they are familiar with the care that home hospice provides.  Currently, they both endorse that this does not lined up with his desire for care moving forward.  This would, however, potentially change if he is not a candidate for further systemic therapy for his cancer.   No other palliative specific recommendations at this time.  Please call or reconsult if we can be of further assistance in the care of Steve [name] moving forward.  Goals of Care and Additional Recommendations: Limitations on Scope of Treatment: Full Scope Treatment  Code Status:    Code Status Orders  (From admission, onward)           Start     Ordered   09/19/20 1822  Full code  Continuous        09/19/20 1824           Code Status History     Date Active Date Inactive Code Status Order ID Comments User Context   08/13/2020 1818 08/25/2020 1609 Full Code [id number]  Norval Morton, MD ED   07/21/2020 0422 07/28/2020 1922 Full Code [id number]  Germain Osgood, PA-C ED   07/14/2020 1602 07/20/2020 1719 Full Code [id number]  Jonnie Finner, DO Inpatient   05/25/2020 2230 05/30/2020 0224 Full Code [id number]  Vernelle Emerald, MD ED   03/23/2020 1233 04/17/2020 2053 Full Code [id number]  Juanito Doom, MD Inpatient   08/28/2019 2100 10/31/2019  [place] Full Code [id number]  Maudry Diego Inpatient   07/08/2019 1852 08/28/2019 2001 Full Code [id number]  Johnsie Cancel, NP ED   05/17/2019 0443 05/22/2019 1728 Full Code [id number]  Jani Gravel, MD Inpatient   11/30/2012 2139 12/02/2012 1725 Full Code [id number]  [name], MD Inpatient   09/05/2012 2245 09/08/2012 1509 Full Code [id number]  [name], MD Inpatient   07/24/2011 0116 08/01/2011 1827 Full Code [id number]  Theressa Millard, MD ED       Prognosis: Guarded  Discharge Planning: Home  Care plan was discussed with patient, bedside RN  Thank you for allowing the Palliative Medicine Team to assist in the care of this  patient.   Time In: 1000 Time Out: 1025 Total Time 25 Prolonged Time Billed No   Greater than 50%  of this time was spent counseling and coordinating care related to the above assessment and plan.   Micheline Rough, MD  Please contact Palliative Medicine Team phone at [phone] for questions and concerns.

## 2020-10-22 NOTE — Progress Notes (Signed)
Physical Therapy Treatment Patient Details Name: Steve [name] MRN: [id number] DOB: [birth year] Today's Date: 10/22/2020    History of Present Illness Patient  is a 65 year old male who was admitted for possible pneumonia and found to have a staph epidermidis bacteremia. 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  0n 09/24/20, developed respiratory distress with increased oxygen requirement and was transferred to ICU and started on pressure support ventilation. Currently on trach collar on 10L fiO2 at 60% with intermittent vent support.    PT Comments    Patient resting in bed on 40% FiO2/10 L, SPO2 91%. Assisted to sitting and standing, then pivot to recliner using RW and 2 assist. Spo2 down to 66%, required 23 minutes to return to 85%.Patient performed active exercises with SPO2 dropping into mid 70's % with recovery after resting for 4 minutes/  RR from 25- 33 with activity.HR remains in 90's.  [name] agreed with attempting progressive ambulation. Patient reports that he did ambulate several times per week x ~ 20' during  therapy visits.  Continue PT for mobility and strengthening.  Follow Up Recommendations  Home health PT     Equipment Recommendations  None recommended by PT    Recommendations for Other Services       Precautions / Restrictions Precautions Precautions: Fall Precaution Comments: trach collar fiO240%/10 L    Mobility  Bed Mobility   Bed Mobility: Supine to Sit     Supine to sit: Supervision;HOB elevated     General bed mobility comments: no physical assistance in/out of bed    Transfers   Equipment used: Rolling walker (2 wheeled) Transfers: Sit to/from [employer] Sit to Stand: +2 safety/equipment;Min assist Stand pivot transfers: Min assist;+2 safety/equipment       General transfer comment: able to rise from edge of bed, min for safety and x2 for line management  Ambulation/Gait                 Stairs             Wheelchair Mobility    Modified Rankin (Stroke Patients Only)       Balance   Sitting-balance support: Feet supported Sitting balance-Leahy Scale: Fair Sitting balance - Comments: cues to not use arms to prop   Standing balance support: Bilateral upper extremity supported Standing balance-Leahy Scale: Poor Standing balance comment: reliant on UE support                            Cognition   Behavior During Therapy: [facility] for tasks assessed/performed                                   General Comments: pleasant and motivated.  TRACH but does mouth words and use hand gestures, spoke out loud some today.      Exercises General Exercises - Lower Extremity Ankle Circles/Pumps: AROM;PROM;20 reps (belt to stretch) Heel Slides: AROM;Both;10 reps Hip ABduction/ADduction: AROM;Both;10 reps Straight Leg Raises: AROM;Both;20 reps Other Exercises Other Exercises: KNee/hip extension with yellow band each leg x 20    General Comments  Pertinent Vitals/Pain Pain Assessment: No/denies pain    Home Living                      Prior Function            PT Goals (current goals can now be found in the care plan section) Progress towards PT goals: Progressing toward goals    Frequency    Min 3X/week      PT Plan Current plan remains appropriate    Co-evaluation              AM-PAC PT "6 Clicks" Mobility   Outcome Measure  Help needed turning from your back to your side while in a flat bed without using bedrails?: A Little Help needed moving from lying on your back to sitting on the side of a flat bed without using bedrails?: A Little Help  needed moving to and from a bed to a chair (including a wheelchair)?: A Little Help needed standing up from a chair using your arms (e.g., wheelchair or bedside chair)?: A Little Help needed to walk in hospital room?: A Lot Help needed climbing 3-5 steps with a railing? : Total 6 Click Score: 15    End of Session   Activity Tolerance: Patient tolerated treatment well;Treatment limited secondary to medical complications (Comment) Patient left: in chair;with call bell/phone within reach;with nursing/sitter in room Nurse Communication: Mobility status PT Visit Diagnosis: Unsteadiness on feet (R26.81);Muscle weakness (generalized) (M62.81)     Time: 4235-3614 PT Time Calculation (min) (ACUTE ONLY): 36 min  Charges:  $Therapeutic Exercise: 8-22 mins $Therapeutic Activity: 8-22 mins                     [name] PT Acute Rehabilitation Services Pager [phone] Office [phone]    Claretha Cooper 10/22/2020, 1:17 PM

## 2020-10-22 NOTE — Progress Notes (Signed)
PROGRESS NOTE    Steve [name]  WIO:[id number] DOB: [birth year] DOA: 09/19/2020 PCP: [name], PA-C   Brief Narrative: 65 year old male with history of Hodgkin's lymphoma, type 2 diabetes, hypertension, GERD, prolonged LAGTX-64 complicated by ARDS, chronic respiratory failure on 8 L of oxygen via trach, PEG dependence admitted with generalized malaise poor appetite failure to thrive and was found to have staph epidermis bacteremia.  TEE was unable to be done as patient was unstable.  ID recommended 6 weeks of IV antibiotics from the first negative blood culture which was on 09/22/2020. Assessment & Plan:   Principal Problem:   Failure to thrive in adult Active Problems:   Pressure ulcer   Tracheostomy dependence ([name])   Chronic respiratory failure with hypoxia ([name])   Hodgkin's lymphoma ([place])   Essential hypertension   Bacteremia   Respiratory distress   #1 acute on chronic respiratory failure with tracheostomy in the setting of interstitial lung disease and pulmonary fibrosis.  He is on 8 L of oxygen chronically at baseline.  Currently he is vent dependent. Patient and his wife does not want him to go to [facility] they want him to go home with triligy/vent. PCCM following for vent.(Signed off 7/14)  #2 sepsis secondary to staph epidermis bacteremia/pneumonia present on admission.  He met criteria for sepsis on admission with tachypnea tachycardia with recurrent pneumonia and leukocytosis.  CT of the chest revealed interval increase in bulk of mediastinal and axillary lymphadenopathy.  Port-A-Cath removed 09/24/2020.  ID recommended vancomycin for 6 weeks from date 09/22/2020.  #3 relapsed Hodgkin's lymphoma followed by Dr. Marin Olp.  4.  Type 2 diabetes A1c is 7.2.  Continue Lantus. CBG (last 3)  Recent Labs    10/21/20 2345 10/22/20 0341 10/22/20 0839  GLUCAP 110* 145* 170*     #5 hyponatremia and hypokalemia repleted.  #6 protein calorie malnutrition due to chronic  illness and malignancy status post IV albumin x1.  #7 history of depression anxiety on Zoloft.  #8 hypotension he received albumin x1.  On midodrine 10 mg 3 times a day.  #9 pressure injury on the sacrum present on admission see below  Pressure Injury 08/16/20 Sacrum Lower;Medial Deep Tissue Pressure Injury - Purple or maroon localized area of discolored intact skin or blood-filled blister due to damage of underlying soft tissue from pressure and/or shear. (Active)  08/16/20 1200  Location: Sacrum  Location Orientation: Lower;Medial  Staging: Deep Tissue Pressure Injury - Purple or maroon localized area of discolored intact skin or blood-filled blister due to damage of underlying soft tissue from pressure and/or shear.  Wound Description (Comments):   Present on Admission: Yes     Pressure Injury 09/27/20 Sacrum Mid Stage 1 -  Intact skin with non-blanchable redness of a localized area usually over a bony prominence. redness, looks like MASD (Active)  09/27/20 0800  Location: Sacrum  Location Orientation: Mid  Staging: Stage 1 -  Intact skin with non-blanchable redness of a localized area usually over a bony prominence.  Wound Description (Comments): redness, looks like MASD  Present on Admission:       Nutrition Problem: Severe Malnutrition Etiology: chronic illness, cancer and cancer related treatments     Signs/Symptoms: severe fat depletion, severe muscle depletion    Interventions: Tube feeding, Prostat, MVI  Estimated body mass index is 23.08 kg/m as calculated from the following:   Height as of this encounter: 5' 5"  (1.651 m).   Weight as of this encounter: 62.9 kg.  DVT prophylaxis:  Lovenox  code Status: Full code  family Communication: Called wife  disposition Plan:  Status is: Inpatient  Dispo: The patient is from: Home              Anticipated d/c is to: Home              Patient currently is not medically stable to d/c.   Difficult to place patient No    Consultants: Dr. Marin Olp, Dr. Graylon Good, PCCM  Procedures: Right IJ Port-A-Cath per IR 09/22/2020  Antimicrobials:  Anti-infectives (From admission, onward)    Start     Dose/Rate Route Frequency Ordered Stop   10/15/20 2200  vancomycin (VANCOREADY) IVPB 1500 mg/300 mL        1,500 mg 150 mL/hr over 120 Minutes Intravenous Every 24 hours 10/15/20 1219 11/04/20 2159   09/23/20 0800  ceFEPIme (MAXIPIME) 2 g in sodium chloride 0.9 % 100 mL IVPB  Status:  Discontinued        2 g 200 mL/hr over 30 Minutes Intravenous Every 8 hours 09/23/20 0738 09/23/20 1758   09/21/20 0400  vancomycin (VANCOREADY) IVPB 750 mg/150 mL  Status:  Discontinued        750 mg 150 mL/hr over 60 Minutes Intravenous Every 12 hours 09/20/20 1715 10/15/20 1219   09/20/20 1730  vancomycin (VANCOREADY) IVPB 750 mg/150 mL        750 mg 150 mL/hr over 60 Minutes Intravenous STAT 09/20/20 1714 09/20/20 1954   09/20/20 0400  vancomycin (VANCOREADY) IVPB 750 mg/150 mL  Status:  Discontinued        750 mg 150 mL/hr over 60 Minutes Intravenous Every 12 hours 09/19/20 1932 09/20/20 1155   09/20/20 0000  ceFEPIme (MAXIPIME) 2 g in sodium chloride 0.9 % 100 mL IVPB  Status:  Discontinued        2 g 200 mL/hr over 30 Minutes Intravenous Every 8 hours 09/19/20 1932 09/23/20 0721   09/19/20 1600  ceFEPIme (MAXIPIME) 2 g in sodium chloride 0.9 % 100 mL IVPB        2 g 200 mL/hr over 30 Minutes Intravenous  Once 09/19/20 1548 09/19/20 1634   09/19/20 1600  vancomycin (VANCOREADY) IVPB 1250 mg/250 mL        1,250 mg 166.7 mL/hr over 90 Minutes Intravenous STAT 09/19/20 1557 09/19/20 1822         Subjective: Resting in bed  Chart reviewed Waiting for home vent arrangement  Objective: Vitals:   10/22/20 0700 10/22/20 0800 10/22/20 0900 10/22/20 1000  BP: (!) 88/48 (!) 86/47 (!) 101/56 115/63  Pulse: 76 72 83 85  Resp: (!) 32 10 (!) 22 (!) 22  Temp:  98 F (36.7 C)    TempSrc:  Axillary    SpO2: 93% 92% (!) 84% (!) 87%   Weight:      Height:        Intake/Output Summary (Last 24 hours) at 10/22/2020 1117 Last data filed at 10/22/2020 0800 Gross per 24 hour  Intake 1963.33 ml  Output 1801 ml  Net 162.33 ml    Filed Weights   10/20/20 0500 10/21/20 0500 10/22/20 0430  Weight: 63.6 kg 63.2 kg 62.9 kg    Examination:  General exam: Appears calm and comfortable  Respiratory system: Few scattered rhonchi bilaterally respiratory effort normal. Cardiovascular system: S1 & S2 heard, RRR. No JVD, murmurs, rubs, gallops or clicks. No pedal edema. Gastrointestinal system: Abdomen is nondistended, soft and nontender. No organomegaly or masses felt. Normal  bowel sounds heard. Central nervous system: Alert and oriented. No focal neurological deficits. Extremities: Symmetric 5 x 5 power. Skin: No rashes, lesions or ulcers Psychiatry: Judgement and insight appear normal. Mood & affect appropriate.     Data Reviewed: I have personally reviewed following labs and imaging studies  CBC: Recent Labs  Lab 10/16/20 0500 10/18/20 0425 10/20/20 1400 10/21/20 0527  WBC 5.9 6.7 7.3 7.9  NEUTROABS 3.2  --   --   --   HGB 8.8* 9.7* 9.7* 9.7*  HCT 29.9* 32.3* 31.8* 32.1*  MCV 98.7 96.7 96.4 97.0  PLT 95* 117* 145* 143*    Basic Metabolic Panel: Recent Labs  Lab 10/18/20 0425 10/19/20 0520 10/20/20 0523 10/21/20 0527 10/22/20 0235  NA 141 139 139 140 140  K 4.7 4.7 4.6 4.5 4.6  CL 93* 93* 92* 94* 95*  CO2 41* 39* 41* 41* 38*  GLUCOSE 170* 156* 126* 127* 152*  BUN 27* 29* 30* 28* 26*  CREATININE 0.57* 0.62 0.64 0.61 0.58*  CALCIUM 10.5* 10.7* 10.4* 10.6* 10.5*  PHOS 3.9 4.2 4.5 4.3 4.0    GFR: Estimated Creatinine Clearance: 80.1 mL/min (A) (by C-G formula based on SCr of 0.58 mg/dL (L)). Liver Function Tests: Recent Labs  Lab 10/18/20 0425 10/19/20 0520 10/20/20 0523 10/21/20 0527 10/22/20 0235  ALBUMIN 3.9 3.8 3.8 3.8 3.7    No results for input(s): LIPASE, AMYLASE in the last 168  hours. No results for input(s): AMMONIA in the last 168 hours. Coagulation Profile: No results for input(s): INR, PROTIME in the last 168 hours. Cardiac Enzymes: No results for input(s): CKTOTAL, CKMB, CKMBINDEX, TROPONINI in the last 168 hours. BNP (last 3 results) No results for input(s): PROBNP in the last 8760 hours. HbA1C: No results for input(s): HGBA1C in the last 72 hours. CBG: Recent Labs  Lab 10/21/20 1544 10/21/20 2025 10/21/20 2345 10/22/20 0341 10/22/20 0839  GLUCAP 169* 123* 110* 145* 170*    Lipid Profile: No results for input(s): CHOL, HDL, LDLCALC, TRIG, CHOLHDL, LDLDIRECT in the last 72 hours. Thyroid Function Tests: No results for input(s): TSH, T4TOTAL, FREET4, T3FREE, THYROIDAB in the last 72 hours. Anemia Panel: No results for input(s): VITAMINB12, FOLATE, FERRITIN, TIBC, IRON, RETICCTPCT in the last 72 hours. Sepsis Labs: No results for input(s): PROCALCITON, LATICACIDVEN in the last 168 hours.  No results found for this or any previous visit (from the past 240 hour(s)).       Radiology Studies: No results found.      Scheduled Meds:  chlorhexidine gluconate (MEDLINE KIT)  15 mL Mouth Rinse BID   Chlorhexidine Gluconate Cloth  6 each Topical Q0600   cholecalciferol  10,000 Units Oral Daily   enoxaparin (LOVENOX) injection  40 mg Subcutaneous Q24H   feeding supplement (PROSource TF)  45 mL Per Tube Daily   free water  100 mL Per Tube Q6H   insulin aspart  0-9 Units Subcutaneous Q4H   insulin glargine  10 Units Subcutaneous QHS   mouth rinse  15 mL Mouth Rinse BID   midodrine  10 mg Oral TID WC   multivitamin with minerals  1 tablet Oral Daily   pantoprazole  40 mg Oral Daily   sertraline  100 mg Oral Daily   sodium chloride flush  10-40 mL Intracatheter Q12H   traZODone  100 mg Oral QHS   Continuous Infusions:  sodium chloride 10 mL/hr (10/21/20 0800)   feeding supplement (OSMOLITE 1.2 CAL) 80 mL/hr at 10/21/20 1500  vancomycin  Stopped (10/21/20 2320)     LOS: 33 days    Time spent: 37 min    Georgette Shell, MD 10/22/2020, 11:17 AM

## 2020-10-23 ENCOUNTER — Encounter: Payer: Self-pay | Admitting: *Deleted

## 2020-10-23 LAB — GLUCOSE, CAPILLARY
Glucose-Capillary: 102 mg/dL — ABNORMAL HIGH (ref 70–99)
Glucose-Capillary: 122 mg/dL — ABNORMAL HIGH (ref 70–99)
Glucose-Capillary: 127 mg/dL — ABNORMAL HIGH (ref 70–99)
Glucose-Capillary: 133 mg/dL — ABNORMAL HIGH (ref 70–99)
Glucose-Capillary: 138 mg/dL — ABNORMAL HIGH (ref 70–99)
Glucose-Capillary: 139 mg/dL — ABNORMAL HIGH (ref 70–99)
Glucose-Capillary: 194 mg/dL — ABNORMAL HIGH (ref 70–99)

## 2020-10-23 LAB — RENAL FUNCTION PANEL
Albumin: 3.7 g/dL (ref 3.5–5.0)
Anion gap: 8 (ref 5–15)
BUN: 24 mg/dL — ABNORMAL HIGH (ref 8–23)
CO2: 37 mmol/L — ABNORMAL HIGH (ref 22–32)
Calcium: 10.4 mg/dL — ABNORMAL HIGH (ref 8.9–10.3)
Chloride: 94 mmol/L — ABNORMAL LOW (ref 98–111)
Creatinine, Ser: 0.54 mg/dL — ABNORMAL LOW (ref 0.61–1.24)
GFR, Estimated: >60 mL/min
Glucose, Bld: 135 mg/dL — ABNORMAL HIGH (ref 70–99)
Phosphorus: 4.5 mg/dL (ref 2.5–4.6)
Potassium: 4.2 mmol/L (ref 3.5–5.1)
Sodium: 139 mmol/L (ref 135–145)

## 2020-10-23 LAB — VANCOMYCIN, PEAK: Vancomycin Pk: 52 ug/mL (ref 30–40)

## 2020-10-23 MED ORDER — VANCOMYCIN HCL 1000 MG/200ML IV SOLN
1000.0000 mg | INTRAVENOUS | Status: DC
  Administered 2020-10-23 – 2020-10-26 (×4): 1000 mg via INTRAVENOUS
  Filled 2020-10-23 (×4): qty 200

## 2020-10-23 MED ORDER — CHLORHEXIDINE GLUCONATE 0.12 % MT SOLN
OROMUCOSAL | Status: AC
  Administered 2020-10-23: 15 mL via OROMUCOSAL
  Filled 2020-10-23: qty 15

## 2020-10-23 NOTE — Progress Notes (Signed)
Pt placed on trilogy (home machine) for bedtime. RT will continue to monitor through out the night

## 2020-10-23 NOTE — Progress Notes (Signed)
PROGRESS NOTE    Steve [name]  TUU:[id number] DOB: 02-23-1956 DOA: 09/19/2020 PCP: [name], PA-C   Brief Narrative: 65 year old male with history of Hodgkin's lymphoma, type 2 diabetes, hypertension, GERD, prolonged PHXTA-56 complicated by ARDS, chronic respiratory failure on 8 L of oxygen via trach, PEG dependence admitted with generalized malaise poor appetite failure to thrive and was found to have staph epidermis bacteremia.  TEE was unable to be done as patient was unstable.  ID recommended 6 weeks of IV antibiotics from the first negative blood culture which was on 09/22/2020. Assessment & Plan:   Principal Problem:   Failure to thrive in adult Active Problems:   Pressure ulcer   Tracheostomy dependence ([name])   Chronic respiratory failure with hypoxia ([name])   Hodgkin's lymphoma ([place])   Essential hypertension   Bacteremia   Respiratory distress   #1 acute on chronic respiratory failure with tracheostomy in the setting of interstitial lung disease and pulmonary fibrosis.  He is on 8 L of oxygen chronically at baseline.  Currently he is vent dependent. Patient and his wife does not want him to go to [facility] they want him to go home with triligy/vent. PCCM following for vent.(Signed off 7/14) [facility] trying to set up in home vent   #2 sepsis secondary to staph epidermis bacteremia/pneumonia present on admission.  He met criteria for sepsis on admission with tachypnea tachycardia with recurrent pneumonia and leukocytosis.  CT of the chest revealed interval increase in bulk of mediastinal and axillary lymphadenopathy.  Port-A-Cath removed 09/24/2020.  ID recommended vancomycin for 6 weeks from date 09/22/2020.  #3 relapsed Hodgkin's lymphoma followed by Dr. Marin Olp.  4.  Type 2 diabetes A1c is 7.2.  Continue Lantus. CBG (last 3)  Recent Labs    10/23/20 0012 10/23/20 0331 10/23/20 0825  GLUCAP 122* 133* 194*     #5 hyponatremia and hypokalemia repleted.  #6 protein  calorie malnutrition due to chronic illness and malignancy status post IV albumin x1.  #7 history of depression anxiety on Zoloft.  #8 hypotension he received albumin x1.  On midodrine 10 mg 3 times a day.  #9 pressure injury on the sacrum present on admission see below  Pressure Injury 08/16/20 Sacrum Lower;Medial Deep Tissue Pressure Injury - Purple or maroon localized area of discolored intact skin or blood-filled blister due to damage of underlying soft tissue from pressure and/or shear. (Active)  08/16/20 1200  Location: Sacrum  Location Orientation: Lower;Medial  Staging: Deep Tissue Pressure Injury - Purple or maroon localized area of discolored intact skin or blood-filled blister due to damage of underlying soft tissue from pressure and/or shear.  Wound Description (Comments):   Present on Admission: Yes     Pressure Injury 09/27/20 Sacrum Mid Stage 1 -  Intact skin with non-blanchable redness of a localized area usually over a bony prominence. redness, looks like MASD (Active)  09/27/20 0800  Location: Sacrum  Location Orientation: Mid  Staging: Stage 1 -  Intact skin with non-blanchable redness of a localized area usually over a bony prominence.  Wound Description (Comments): redness, looks like MASD  Present on Admission:       Nutrition Problem: Severe Malnutrition Etiology: chronic illness, cancer and cancer related treatments     Signs/Symptoms: severe fat depletion, severe muscle depletion    Interventions: Tube feeding, Prostat, MVI  Estimated body mass index is 23.08 kg/m as calculated from the following:   Height as of this encounter: 5' 5"  (1.651 m).   Weight  as of this encounter: 62.9 kg.  DVT prophylaxis: Lovenox  code Status: Full code  family Communication: Called wife  disposition Plan:  Status is: Inpatient  Dispo: The patient is from: Home              Anticipated d/c is to: Home              Patient currently is not medically stable to  d/c.   Difficult to place patient No   Consultants: Dr. Marin Olp, Dr. Graylon Good, PCCM  Procedures: Right IJ Port-A-Cath per IR 09/22/2020  Antimicrobials:  Anti-infectives (From admission, onward)    Start     Dose/Rate Route Frequency Ordered Stop   10/23/20 2200  vancomycin (VANCOREADY) IVPB 1000 mg/200 mL        1,000 mg 200 mL/hr over 60 Minutes Intravenous Every 24 hours 10/23/20 0846 11/04/20 2159   10/15/20 2200  vancomycin (VANCOREADY) IVPB 1500 mg/300 mL  Status:  Discontinued        1,500 mg 150 mL/hr over 120 Minutes Intravenous Every 24 hours 10/15/20 1219 10/23/20 0846   09/23/20 0800  ceFEPIme (MAXIPIME) 2 g in sodium chloride 0.9 % 100 mL IVPB  Status:  Discontinued        2 g 200 mL/hr over 30 Minutes Intravenous Every 8 hours 09/23/20 0738 09/23/20 1758   09/21/20 0400  vancomycin (VANCOREADY) IVPB 750 mg/150 mL  Status:  Discontinued        750 mg 150 mL/hr over 60 Minutes Intravenous Every 12 hours 09/20/20 1715 10/15/20 1219   09/20/20 1730  vancomycin (VANCOREADY) IVPB 750 mg/150 mL        750 mg 150 mL/hr over 60 Minutes Intravenous STAT 09/20/20 1714 09/20/20 1954   09/20/20 0400  vancomycin (VANCOREADY) IVPB 750 mg/150 mL  Status:  Discontinued        750 mg 150 mL/hr over 60 Minutes Intravenous Every 12 hours 09/19/20 1932 09/20/20 1155   09/20/20 0000  ceFEPIme (MAXIPIME) 2 g in sodium chloride 0.9 % 100 mL IVPB  Status:  Discontinued        2 g 200 mL/hr over 30 Minutes Intravenous Every 8 hours 09/19/20 1932 09/23/20 0721   09/19/20 1600  ceFEPIme (MAXIPIME) 2 g in sodium chloride 0.9 % 100 mL IVPB        2 g 200 mL/hr over 30 Minutes Intravenous  Once 09/19/20 1548 09/19/20 1634   09/19/20 1600  vancomycin (VANCOREADY) IVPB 1250 mg/250 mL        1,250 mg 166.7 mL/hr over 90 Minutes Intravenous STAT 09/19/20 1557 09/19/20 1822         Subjective: Patient resting in bed able to answer questions appropriately nods head yes or no  Objective: Vitals:    10/23/20 0016 10/23/20 0334 10/23/20 0353 10/23/20 0730  BP:   (!) 97/56   Pulse:  75 68   Resp:  (!) 25 (!) 31   Temp: (!) 97.2 F (36.2 C)  98.5 F (36.9 C) 98.4 F (36.9 C)  TempSrc: Axillary  Oral Oral  SpO2:  99% 100%   Weight:      Height:        Intake/Output Summary (Last 24 hours) at 10/23/2020 1055 Last data filed at 10/23/2020 0511 Gross per 24 hour  Intake 2381.06 ml  Output 1525 ml  Net 856.06 ml    Filed Weights   10/20/20 0500 10/21/20 0500 10/22/20 0430  Weight: 63.6 kg 63.2 kg 62.9 kg  Examination:  General exam: Appears calm and comfortable on vent  Respiratory system: Few scattered rhonchi bilaterally respiratory effort normal. Cardiovascular system: S1 & S2 heard, RRR. No JVD, murmurs, rubs, gallops or clicks. No pedal edema. Gastrointestinal system: Abdomen is nondistended, soft and nontender. No organomegaly or masses felt. Normal bowel sounds heard. Central nervous system: Alert and oriented. No focal neurological deficits. Extremities: Symmetric 5 x 5 power. Skin: No rashes, lesions or ulcers Psychiatry: Judgement and insight appear normal. Mood & affect appropriate.     Data Reviewed: I have personally reviewed following labs and imaging studies  CBC: Recent Labs  Lab 10/18/20 0425 10/20/20 1400 10/21/20 0527  WBC 6.7 7.3 7.9  HGB 9.7* 9.7* 9.7*  HCT 32.3* 31.8* 32.1*  MCV 96.7 96.4 97.0  PLT 117* 145* 143*    Basic Metabolic Panel: Recent Labs  Lab 10/19/20 0520 10/20/20 0523 10/21/20 0527 10/22/20 0235 10/23/20 0347  NA 139 139 140 140 139  K 4.7 4.6 4.5 4.6 4.2  CL 93* 92* 94* 95* 94*  CO2 39* 41* 41* 38* 37*  GLUCOSE 156* 126* 127* 152* 135*  BUN 29* 30* 28* 26* 24*  CREATININE 0.62 0.64 0.61 0.58* 0.54*  CALCIUM 10.7* 10.4* 10.6* 10.5* 10.4*  PHOS 4.2 4.5 4.3 4.0 4.5    GFR: Estimated Creatinine Clearance: 80.1 mL/min (A) (by C-G formula based on SCr of 0.54 mg/dL (L)). Liver Function Tests: Recent Labs   Lab 10/19/20 0520 10/20/20 0523 10/21/20 0527 10/22/20 0235 10/23/20 0347  ALBUMIN 3.8 3.8 3.8 3.7 3.7    No results for input(s): LIPASE, AMYLASE in the last 168 hours. No results for input(s): AMMONIA in the last 168 hours. Coagulation Profile: No results for input(s): INR, PROTIME in the last 168 hours. Cardiac Enzymes: No results for input(s): CKTOTAL, CKMB, CKMBINDEX, TROPONINI in the last 168 hours. BNP (last 3 results) No results for input(s): PROBNP in the last 8760 hours. HbA1C: No results for input(s): HGBA1C in the last 72 hours. CBG: Recent Labs  Lab 10/22/20 1703 10/22/20 1951 10/23/20 0012 10/23/20 0331 10/23/20 0825  GLUCAP 99 101* 122* 133* 194*    Lipid Profile: No results for input(s): CHOL, HDL, LDLCALC, TRIG, CHOLHDL, LDLDIRECT in the last 72 hours. Thyroid Function Tests: No results for input(s): TSH, T4TOTAL, FREET4, T3FREE, THYROIDAB in the last 72 hours. Anemia Panel: No results for input(s): VITAMINB12, FOLATE, FERRITIN, TIBC, IRON, RETICCTPCT in the last 72 hours. Sepsis Labs: No results for input(s): PROCALCITON, LATICACIDVEN in the last 168 hours.  No results found for this or any previous visit (from the past 240 hour(s)).       Radiology Studies: No results found.      Scheduled Meds:  chlorhexidine gluconate (MEDLINE KIT)  15 mL Mouth Rinse BID   Chlorhexidine Gluconate Cloth  6 each Topical Q0600   cholecalciferol  10,000 Units Oral Daily   enoxaparin (LOVENOX) injection  40 mg Subcutaneous Q24H   feeding supplement (PROSource TF)  45 mL Per Tube Daily   free water  100 mL Per Tube Q6H   insulin aspart  0-9 Units Subcutaneous Q4H   insulin glargine  10 Units Subcutaneous QHS   mouth rinse  15 mL Mouth Rinse BID   midodrine  10 mg Oral TID WC   multivitamin with minerals  1 tablet Oral Daily   pantoprazole  40 mg Oral Daily   sertraline  100 mg Oral Daily   sodium chloride flush  10-40 mL Intracatheter Q12H  traZODone  100 mg Oral QHS   Continuous Infusions:  sodium chloride 10 mL/hr (10/21/20 0800)   feeding supplement (OSMOLITE 1.2 CAL) 1,000 mL (10/22/20 1628)   vancomycin       LOS: 34 days    Time spent: 37 min    Georgette Shell, MD 10/23/2020, 10:55 AM

## 2020-10-23 NOTE — Progress Notes (Signed)
Pharmacy Antibiotic Note  Steve [name] is a 65 y.o. male admitted on 09/19/2020 with possible pneumonia, found to have staph epidermidis bacteremia. Pharmacy has been consulted for Vancomycin dosing.  Today, 10/23/2020:   - SCr remains low/stable at 0.54 - WBC wnl  (steroids d/c on 6/24) - ID extended treatment: 6 weeks from first negative blood cultures on 09/22/20 since unable to have TEE - 7/14 VT - 16, 7/15 VP = 52, AUC = 775.3  Plan: Decrease vancomycin to 1000 mg IV every 24 hours OPAT orders updated under discharge orders Stop date 11/03/20  Height: 5\' 5"  (165.1 cm) Weight: 62.9 kg (138 lb 10.7 oz) IBW/kg (Calculated) : 61.5  Temp (24hrs), Avg:97.9 F (36.6 C), Min:97.2 F (36.2 C), Max:98.5 F (36.9 C)  Recent Labs  Lab 10/18/20 0425 10/19/20 0520 10/20/20 0523 10/20/20 1400 10/21/20 0527 10/22/20 0235 10/22/20 2145 10/23/20 0117 10/23/20 0347  WBC 6.7  --   --  7.3 7.9  --   --   --   --   CREATININE 0.57* 0.62 0.64  --  0.61 0.58*  --   --  0.54*  VANCOTROUGH  --   --   --   --   --   --  16  --   --   VANCOPEAK  --   --   --   --   --   --   --  52*  --      Estimated Creatinine Clearance: 80.1 mL/min (A) (by C-G formula based on SCr of 0.54 mg/dL (L)).    No Known Allergies  Antimicrobials this admission: 6/11 Vancomycin >> 6/12, resumed 6/12 >  (7/26) 6/11 Cefepime >> 6/15  Dose adjustments this admission: 6/14 0536 VP: 37, 1643 VT: 10 - AUC 527, continue Vanc 750mg  q12h  6/20 VP 1800 = 24; 6/21 VT 0330 = 12; AUC 456 7/1 0500 VT = 14, keep 750 q12 7/7 0521 VT = 20, adj Vanc to 1500mg  q24 7/14 VT - 16, 7/15 VP = 52, AUC = 775.3; decrease vancomycin to 1000 mg IV every 24 hours  Microbiology results: 6/11 BCx: 4/4 bottles Staph epi (methicillin resistance detected)  6/11 UCx: multiple species 6/11 Sputum:  needs collecting 6/11 Strep pneumo urinary antigen: neg 6/11 Legionella urinary antigen: neg 6/12 MRSA PCR: neg 6/14 BCx: NGF   Thank  you for allowing pharmacy to be a part of this patient's care.  Shela Commons, PharmD, BCPS Pharmacy: [phone] 10/23/2020, 8:47 AM

## 2020-10-23 NOTE — Progress Notes (Signed)
Patient continues to be hospitalized. Continue to follow for post discharge follow up and treatment needs.  Oncology Nurse Navigator Documentation  Oncology Nurse Navigator Flowsheets 10/23/2020  Abnormal Finding Date -  Confirmed Diagnosis Date -  Diagnosis Status -  Planned Course of Treatment -  Phase of Treatment -  Chemotherapy Pending- Reason: -  Chemotherapy Actual Start Date: -  Navigator Follow Up Date: 11/03/2020  Navigator Follow Up Reason: Appointment Review  Navigator Location [facility]  Referral Date to RadOnc/MedOnc -  Navigator Encounter Type Appt/Treatment Plan Review  Telephone -  Treatment Initiated Date -  Patient Visit Type MedOnc  Treatment Phase Active Tx  Barriers/Navigation Needs Coordination of Care;Education;Employed;Family Concerns;Morbidities/Frailty;Multiple Hospital Admissions  Education -  Interventions None Required  Acuity Level 4-High Needs (Greater Than 4 Barriers Identified)  Referrals -  Coordination of Care -  Education Method -  Support Groups/Services Friends and Family  Time Spent with Patient 15

## 2020-10-23 NOTE — Progress Notes (Signed)
[facility] started the Pt on 35 IPAP and  EPAP 8 with bled in. PT wasn't able to tolerate. Pt tolerated 20 IPAP and 8 EPAP  8L bleed in. Pt's wife was in room for training.

## 2020-10-24 LAB — GLUCOSE, CAPILLARY
Glucose-Capillary: 117 mg/dL — ABNORMAL HIGH (ref 70–99)
Glucose-Capillary: 120 mg/dL — ABNORMAL HIGH (ref 70–99)
Glucose-Capillary: 120 mg/dL — ABNORMAL HIGH (ref 70–99)
Glucose-Capillary: 115 mg/dL — ABNORMAL HIGH (ref 70–99)
Glucose-Capillary: 148 mg/dL — ABNORMAL HIGH (ref 70–99)
Glucose-Capillary: 115 mg/dL — ABNORMAL HIGH (ref 70–99)

## 2020-10-24 LAB — RENAL FUNCTION PANEL
Albumin: 3.5 g/dL (ref 3.5–5.0)
Anion gap: 7 (ref 5–15)
BUN: 26 mg/dL — ABNORMAL HIGH (ref 8–23)
CO2: 39 mmol/L — ABNORMAL HIGH (ref 22–32)
Calcium: 10.5 mg/dL — ABNORMAL HIGH (ref 8.9–10.3)
Chloride: 93 mmol/L — ABNORMAL LOW (ref 98–111)
Creatinine, Ser: 0.55 mg/dL — ABNORMAL LOW (ref 0.61–1.24)
GFR, Estimated: >60 mL/min (ref >60)
Glucose, Bld: 136 mg/dL — ABNORMAL HIGH (ref 70–99)
Phosphorus: 4.1 mg/dL (ref 2.5–4.6)
Potassium: 4.5 mmol/L (ref 3.5–5.1)
Sodium: 139 mmol/L (ref 135–145)

## 2020-10-24 MED ORDER — CHLORHEXIDINE GLUCONATE CLOTH 2 % EX PADS
6.0000 | MEDICATED_PAD | Freq: Every day | CUTANEOUS | Status: DC
  Administered 2020-10-26: 6 via TOPICAL

## 2020-10-24 NOTE — Progress Notes (Signed)
Pt has tolerated Trilogy very well tonight with no complaints or complications. Pt vitals has remained within normal limits.

## 2020-10-24 NOTE — Progress Notes (Signed)
RT NOTE:  Pt taken off home trilogy machine and placed on ATC tolerating well at this time. Vitals are stable, RT will continue to monitor pt status.

## 2020-10-24 NOTE — Progress Notes (Signed)
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  TEE was unable to be done as patient was unstable.  ID recommended 6 weeks of IV antibiotics from the first negative blood culture which was on 09/22/2020. Assessment & Plan:   Principal Problem:   Failure to thrive in adult Active Problems:   Pressure ulcer   Tracheostomy dependence ([name])   Chronic respiratory failure with hypoxia ([name])   Hodgkin's lymphoma ([name])   Essential hypertension   Bacteremia   Respiratory distress   #1 acute on chronic respiratory failure with tracheostomy in the setting of interstitial lung disease and pulmonary fibrosis.  He is on 8 L of oxygen chronically at baseline.  Currently he is vent dependent. Patient and his wife does not want him to go to [facility] they want him to go home with triligy/vent. PCCM following for vent.(Signed off 7/14) [facility] trying to set up in home vent  He did well with trilogy vent last nite. Will set him up for DC monday  #2 sepsis secondary to staph epidermis bacteremia/pneumonia present on admission.  He met criteria for sepsis on admission with tachypnea tachycardia with recurrent pneumonia and leukocytosis.   CT of the chest revealed interval increase in bulk of mediastinal and axillary lymphadenopathy.  Port-A-Cath removed 09/24/2020.  ID recommended vancomycin for 6 weeks from date 09/22/2020.  #3 relapsed Hodgkin's lymphoma followed by Dr. Marin Olp.  4.  Type 2 diabetes A1c is 7.2.  Continue Lantus. CBG (last 3)  Recent Labs    10/23/20 2257 10/24/20 0311 10/24/20 0749  GLUCAP 127*  117* 120*     #5 hyponatremia and hypokalemia repleted.  #6 protein calorie malnutrition due to chronic illness and malignancy status post IV albumin x1.  #7 history of depression anxiety on Zoloft.  #8 hypotension he received albumin x1.  On midodrine 10 mg 3 times a day.  #9 pressure injury on the sacrum present on admission see below  Pressure Injury 08/16/20 Sacrum Lower;Medial Deep Tissue Pressure Injury - Purple or maroon localized area of discolored intact skin or blood-filled blister due to damage of underlying soft tissue from pressure and/or shear. (Active)  08/16/20 1200  Location: Sacrum  Location Orientation: Lower;Medial  Staging: Deep Tissue Pressure Injury - Purple or maroon localized area of discolored intact skin or blood-filled blister due to damage of underlying soft tissue from pressure and/or shear.  Wound Description (Comments):   Present on Admission: Yes     Pressure Injury 09/27/20 Sacrum Mid Stage 1 -  Intact skin with non-blanchable redness of a localized area usually over a bony prominence. redness, looks like MASD (Active)  09/27/20 0800  Location: Sacrum  Location Orientation: Mid  Staging: Stage 1 -  Intact skin with non-blanchable redness of a localized area usually over a bony prominence.  Wound Description (Comments): redness, looks like MASD  Present on Admission:       Nutrition Problem: Severe Malnutrition Etiology: chronic illness, cancer and cancer related treatments     Signs/Symptoms: severe fat depletion, severe muscle depletion    Interventions: Tube feeding, Prostat, MVI  Estimated body mass index is 23.08 kg/m as calculated from  the following:   Height as of this encounter: _0  (1.651 m).   Weight as of this encounter: 62.9 kg.  DVT prophylaxis: Lovenox  code Status: Full code  family Communication: Called wife  disposition Plan:  Status is: Inpatient  Dispo: The patient is from: Home              Anticipated d/c  is to: Home              Patient currently is not medically stable to d/c.   Difficult to place patient No   Consultants: Dr. Marin Olp, Dr. Graylon Good, PCCM  Procedures: Right IJ Port-A-Cath per IR 09/22/2020  Antimicrobials:  Anti-infectives (From admission, onward)    Start     Dose/Rate Route Frequency Ordered Stop   10/23/20 2200  vancomycin (VANCOREADY) IVPB 1000 mg/200 mL        1,000 mg 200 mL/hr over 60 Minutes Intravenous Every 24 hours 10/23/20 0846 11/04/20 2159   10/15/20 2200  vancomycin (VANCOREADY) IVPB 1500 mg/300 mL  Status:  Discontinued        1,500 mg 150 mL/hr over 120 Minutes Intravenous Every 24 hours 10/15/20 1219 10/23/20 0846   09/23/20 0800  ceFEPIme (MAXIPIME) 2 g in sodium chloride 0.9 % 100 mL IVPB  Status:  Discontinued        2 g 200 mL/hr over 30 Minutes Intravenous Every 8 hours 09/23/20 0738 09/23/20 1758   09/21/20 0400  vancomycin (VANCOREADY) IVPB 750 mg/150 mL  Status:  Discontinued        750 mg 150 mL/hr over 60 Minutes Intravenous Every 12 hours 09/20/20 1715 10/15/20 1219   09/20/20 1730  vancomycin (VANCOREADY) IVPB 750 mg/150 mL        750 mg 150 mL/hr over 60 Minutes Intravenous STAT 09/20/20 1714 09/20/20 1954   09/20/20 0400  vancomycin (VANCOREADY) IVPB 750 mg/150 mL  Status:  Discontinued        750 mg 150 mL/hr over 60 Minutes Intravenous Every 12 hours 09/19/20 1932 09/20/20 1155   09/20/20 0000  ceFEPIme (MAXIPIME) 2 g in sodium chloride 0.9 % 100 mL IVPB  Status:  Discontinued        2 g 200 mL/hr over 30 Minutes Intravenous Every 8 hours 09/19/20 1932 09/23/20 0721   09/19/20 1600  ceFEPIme (MAXIPIME) 2 g in sodium chloride 0.9 % 100 mL IVPB        2 g 200 mL/hr over 30 Minutes Intravenous  Once 09/19/20 1548 09/19/20 1634   09/19/20 1600  vancomycin (VANCOREADY) IVPB 1250 mg/250 mL        1,250 mg 166.7 mL/hr over 90 Minutes Intravenous STAT 09/19/20 1557 09/19/20 1822         Subjective: Patient resting in bed able to  answer questions appropriately nods head yes or no  Objective: Vitals:   10/24/20 0354 10/24/20 0400 10/24/20 0514 10/24/20 0855  BP:  (!) 89/56 (!) 99/54   Pulse:  70 71   Resp:  17 14   Temp: 98.9 F (37.2 C)   98.5 F (36.9 C)  TempSrc: Oral   Oral  SpO2: 98% 100% 98%   Weight:      Height:        Intake/Output Summary (Last 24 hours) at 10/24/2020 0946 Last data filed at 10/24/2020 0512 Gross per 24 hour  Intake 1419.83 ml  Output 2450 ml  Net -1030.17 ml    Filed Weights   10/20/20 0500 10/21/20 0500  10/22/20 0430  Weight: 63.6 kg 63.2 kg 62.9 kg    Examination:  General exam: Appears calm and comfortable on vent  Respiratory system: Few scattered rhonchi bilaterally respiratory effort normal. Cardiovascular system: S1 & S2 heard, RRR. No JVD, murmurs, rubs, gallops or clicks. No pedal edema. Gastrointestinal system: Abdomen is nondistended, soft and nontender. No organomegaly or masses felt. Normal bowel sounds heard. Central nervous system: Alert and oriented. No focal neurological deficits. Extremities: Symmetric 5 x 5 power. Skin: No rashes, lesions or ulcers Psychiatry: Judgement and insight appear normal. Mood & affect appropriate.     Data Reviewed: I have personally reviewed following labs and imaging studies  CBC: Recent Labs  Lab 10/18/20 0425 10/20/20 1400 10/21/20 0527  WBC 6.7 7.3 7.9  HGB 9.7* 9.7* 9.7*  HCT 32.3* 31.8* 32.1*  MCV 96.7 96.4 97.0  PLT 117* 145* 143*    Basic Metabolic Panel: Recent Labs  Lab 10/20/20 0523 10/21/20 0527 10/22/20 0235 10/23/20 0347 10/24/20 0445  NA 139 140 140 139 139  K 4.6 4.5 4.6 4.2 4.5  CL 92* 94* 95* 94* 93*  CO2 41* 41* 38* 37* 39*  GLUCOSE 126* 127* 152* 135* 136*  BUN 30* 28* 26* 24* 26*  CREATININE 0.64 0.61 0.58* 0.54* 0.55*  CALCIUM 10.4* 10.6* 10.5* 10.4* 10.5*  PHOS 4.5 4.3 4.0 4.5 4.1    GFR: Estimated Creatinine Clearance: 80.1 mL/min (A) (by C-G formula based on SCr of 0.55  mg/dL (L)). Liver Function Tests: Recent Labs  Lab 10/20/20 0523 10/21/20 0527 10/22/20 0235 10/23/20 0347 10/24/20 0445  ALBUMIN 3.8 3.8 3.7 3.7 3.5    No results for input(s): LIPASE, AMYLASE in the last 168 hours. No results for input(s): AMMONIA in the last 168 hours. Coagulation Profile: No results for input(s): INR, PROTIME in the last 168 hours. Cardiac Enzymes: No results for input(s): CKTOTAL, CKMB, CKMBINDEX, TROPONINI in the last 168 hours. BNP (last 3 results) No results for input(s): PROBNP in the last 8760 hours. HbA1C: No results for input(s): HGBA1C in the last 72 hours. CBG: Recent Labs  Lab 10/23/20 1543 10/23/20 2001 10/23/20 2257 10/24/20 0311 10/24/20 0749  GLUCAP 138* 139* 127* 117* 120*    Lipid Profile: No results for input(s): CHOL, HDL, LDLCALC, TRIG, CHOLHDL, LDLDIRECT in the last 72 hours. Thyroid Function Tests: No results for input(s): TSH, T4TOTAL, FREET4, T3FREE, THYROIDAB in the last 72 hours. Anemia Panel: No results for input(s): VITAMINB12, FOLATE, FERRITIN, TIBC, IRON, RETICCTPCT in the last 72 hours. Sepsis Labs: No results for input(s): PROCALCITON, LATICACIDVEN in the last 168 hours.  No results found for this or any previous visit (from the past 240 hour(s)).       Radiology Studies: No results found.      Scheduled Meds:  chlorhexidine gluconate (MEDLINE KIT)  15 mL Mouth Rinse BID   Chlorhexidine Gluconate Cloth  6 each Topical Q0600   cholecalciferol  10,000 Units Oral Daily   enoxaparin (LOVENOX) injection  40 mg Subcutaneous Q24H   feeding supplement (PROSource TF)  45 mL Per Tube Daily   free water  100 mL Per Tube Q6H   insulin aspart  0-9 Units Subcutaneous Q4H   insulin glargine  10 Units Subcutaneous QHS   mouth rinse  15 mL Mouth Rinse BID   midodrine  10 mg Oral TID WC   multivitamin with minerals  1 tablet Oral Daily   pantoprazole  40 mg Oral Daily   sertraline  100 mg  Oral Daily   sodium  chloride flush  10-40 mL Intracatheter Q12H   traZODone  100 mg Oral QHS   Continuous Infusions:  sodium chloride 10 mL/hr (10/21/20 0800)   feeding supplement (OSMOLITE 1.2 CAL) Stopped (10/24/20 0855)   vancomycin Stopped (10/23/20 2202)     LOS: 35 days    Time spent: 76 min    Georgette Shell, MD 10/24/2020, 9:46 AM

## 2020-10-24 NOTE — Progress Notes (Signed)
Placed pt on triology home vent for the night with 8L o2 bled in. Pt tolerating vent well and resting.

## 2020-10-24 NOTE — Plan of Care (Signed)
Discussed with patient plan of care for the evening, pain management and evening medications with some teach back displayed.  Patient requested suctioning of trach and inner cannula changed.  RT contact to preform a lavage.    Problem: Education: Goal: Knowledge of General Education information will improve Description: Including pain rating scale, medication(s)/side effects and non-pharmacologic comfort measures Outcome: Progressing   Problem: Health Behavior/Discharge Planning: Goal: Ability to manage health-related needs will improve Outcome: Progressing

## 2020-10-24 NOTE — Plan of Care (Signed)
Dicussed with patient plan of care for the evening, pain management, bathroom devices and wearing Bipap tonight with some teach back displayed.   Problem: Education: Goal: Knowledge of General Education information will improve Description: Including pain rating scale, medication(s)/side effects and non-pharmacologic comfort measures 10/24/2020 2335 by Jannette Fogo, RN Outcome: Progressing 10/24/2020 2121 by Jannette Fogo, RN Outcome: Progressing   Problem: Health Behavior/Discharge Planning: Goal: Ability to manage health-related needs will improve 10/24/2020 2335 by Jannette Fogo, RN Outcome: Progressing 10/24/2020 2121 by Jannette Fogo, RN Outcome: Progressing

## 2020-10-25 LAB — RENAL FUNCTION PANEL
Albumin: 3.6 g/dL (ref 3.5–5.0)
Anion gap: 9 (ref 5–15)
BUN: 26 mg/dL — ABNORMAL HIGH (ref 8–23)
CO2: 38 mmol/L — ABNORMAL HIGH (ref 22–32)
Calcium: 10.6 mg/dL — ABNORMAL HIGH (ref 8.9–10.3)
Chloride: 92 mmol/L — ABNORMAL LOW (ref 98–111)
Creatinine, Ser: 0.71 mg/dL (ref 0.61–1.24)
GFR, Estimated: >60 mL/min (ref >60)
Glucose, Bld: 88 mg/dL (ref 70–99)
Phosphorus: 4.3 mg/dL (ref 2.5–4.6)
Potassium: 4.6 mmol/L (ref 3.5–5.1)
Sodium: 139 mmol/L (ref 135–145)

## 2020-10-25 LAB — GLUCOSE, CAPILLARY
Glucose-Capillary: 128 mg/dL — ABNORMAL HIGH (ref 70–99)
Glucose-Capillary: 161 mg/dL — ABNORMAL HIGH (ref 70–99)
Glucose-Capillary: 109 mg/dL — ABNORMAL HIGH (ref 70–99)
Glucose-Capillary: 96 mg/dL (ref 70–99)
Glucose-Capillary: 145 mg/dL — ABNORMAL HIGH (ref 70–99)

## 2020-10-25 LAB — CBC
HCT: 32.9 % — ABNORMAL LOW (ref 39.0–52.0)
Hemoglobin: 10.1 g/dL — ABNORMAL LOW (ref 13.0–17.0)
MCH: 29.4 pg (ref 26.0–34.0)
MCHC: 30.7 g/dL (ref 30.0–36.0)
MCV: 95.6 fL (ref 80.0–100.0)
Platelets: 199 10*3/uL (ref 150–400)
RBC: 3.44 MIL/uL — ABNORMAL LOW (ref 4.22–5.81)
RDW: 15.9 % — ABNORMAL HIGH (ref 11.5–15.5)
WBC: 10.2 10*3/uL (ref 4.0–10.5)
nRBC: 0 % (ref 0.0–0.2)

## 2020-10-25 MED ORDER — CHLORHEXIDINE GLUCONATE 0.12 % MT SOLN
OROMUCOSAL | Status: AC
  Administered 2020-10-25: 15 mL via OROMUCOSAL
  Filled 2020-10-25: qty 15

## 2020-10-25 NOTE — Progress Notes (Signed)
OT Cancellation Note  Patient Details Name: Steve [name] MRN: [id number] DOB: [birth year]   Cancelled Treatment:    Reason Eval/Treat Not Completed: Other (comment). Attempted to see patient twice. Patient times patient had meals in front of him. Will f/u as abl.e  [name] 10/25/2020, 2:23 PM

## 2020-10-25 NOTE — Progress Notes (Signed)
PROGRESS NOTE    Steve [name]  ZLD:[id number] DOB: 1955/05/06 DOA: 09/19/2020 PCP: [name], PA-C   Brief Narrative: 65 year old male with history of Hodgkin's lymphoma, type 2 diabetes, hypertension, GERD, prolonged [id number] complicated by ARDS, chronic respiratory failure on 8 L of oxygen via trach, PEG dependence admitted with generalized malaise poor appetite failure to thrive and was found to have staph epidermis bacteremia.  TEE was unable to be done as patient was unstable.  ID recommended 6 weeks of IV antibiotics from the first negative blood culture which was on 09/22/2020. Assessment & Plan:   Principal Problem:   Failure to thrive in adult Active Problems:   Pressure ulcer   Tracheostomy dependence ([name])   Chronic respiratory failure with hypoxia ([name])   Hodgkin's lymphoma ([place])   Essential hypertension   Bacteremia   Respiratory distress   #1 acute on chronic respiratory failure with tracheostomy in the setting of interstitial lung disease and pulmonary fibrosis.  He is on 8 L of oxygen chronically at baseline.  Currently he is vent dependent. Patient and his wife does not want him to go to [facility] they want him to go home with triligy/vent. PCCM following for vent.(Signed off 7/14) [employer] trying to set up in home vent  He did well with trilogy vent last nite. Will set him up for DC monday.  Discussed with patient and his wife.  #2 sepsis secondary to staph epidermis bacteremia/pneumonia present on admission.  He met criteria for sepsis on admission with tachypnea tachycardia with recurrent pneumonia and leukocytosis.   CT of the chest revealed interval increase in bulk of mediastinal and axillary lymphadenopathy.  Port-A-Cath removed 09/24/2020.  ID recommended vancomycin for 6 weeks from date 09/22/2020.  #3 relapsed Hodgkin's lymphoma followed by Dr. Marin Olp.  4.  Type 2 diabetes A1c is 7.2.  Continue Lantus. CBG (last 3)  Recent Labs    10/25/20 0301  10/25/20 0808 10/25/20 1121  GLUCAP 128* 161* 109*     #5 hyponatremia and hypokalemia repleted.  #6 protein calorie malnutrition due to chronic illness and malignancy status post IV albumin x1.  #7 history of depression anxiety on Zoloft.  #8 hypotension he received albumin x1.  On midodrine 10 mg 3 times a day.  #9 pressure injury on the sacrum present on admission see below  Pressure Injury 08/16/20 Sacrum Lower;Medial Deep Tissue Pressure Injury - Purple or maroon localized area of discolored intact skin or blood-filled blister due to damage of underlying soft tissue from pressure and/or shear. (Active)  08/16/20 1200  Location: Sacrum  Location Orientation: Lower;Medial  Staging: Deep Tissue Pressure Injury - Purple or maroon localized area of discolored intact skin or blood-filled blister due to damage of underlying soft tissue from pressure and/or shear.  Wound Description (Comments):   Present on Admission: Yes     Pressure Injury 09/27/20 Sacrum Mid Stage 1 -  Intact skin with non-blanchable redness of a localized area usually over a bony prominence. redness, looks like MASD (Active)  09/27/20 0800  Location: Sacrum  Location Orientation: Mid  Staging: Stage 1 -  Intact skin with non-blanchable redness of a localized area usually over a bony prominence.  Wound Description (Comments): redness, looks like MASD  Present on Admission:       Nutrition Problem: Severe Malnutrition Etiology: chronic illness, cancer and cancer related treatments     Signs/Symptoms: severe fat depletion, severe muscle depletion    Interventions: Tube feeding, Prostat, MVI  Estimated body mass  index is 24.07 kg/m as calculated from the following:   Height as of this encounter: 5' 5"  (1.651 m).   Weight as of this encounter: 65.6 kg.  DVT prophylaxis: Lovenox  code Status: Full code  family Communication: Called wife  disposition Plan:  Status is: Inpatient  Dispo: The patient  is from: Home              Anticipated d/c is to: Home              Patient currently is not medically stable to d/c.   Difficult to place patient No   Consultants: Dr. Marin Olp, Dr. Graylon Good, PCCM  Procedures: Right IJ Port-A-Cath per IR 09/22/2020  Antimicrobials:  Anti-infectives (From admission, onward)    Start     Dose/Rate Route Frequency Ordered Stop   10/23/20 2200  vancomycin (VANCOREADY) IVPB 1000 mg/200 mL        1,000 mg 200 mL/hr over 60 Minutes Intravenous Every 24 hours 10/23/20 0846 11/04/20 2159   10/15/20 2200  vancomycin (VANCOREADY) IVPB 1500 mg/300 mL  Status:  Discontinued        1,500 mg 150 mL/hr over 120 Minutes Intravenous Every 24 hours 10/15/20 1219 10/23/20 0846   09/23/20 0800  ceFEPIme (MAXIPIME) 2 g in sodium chloride 0.9 % 100 mL IVPB  Status:  Discontinued        2 g 200 mL/hr over 30 Minutes Intravenous Every 8 hours 09/23/20 0738 09/23/20 1758   09/21/20 0400  vancomycin (VANCOREADY) IVPB 750 mg/150 mL  Status:  Discontinued        750 mg 150 mL/hr over 60 Minutes Intravenous Every 12 hours 09/20/20 1715 10/15/20 1219   09/20/20 1730  vancomycin (VANCOREADY) IVPB 750 mg/150 mL        750 mg 150 mL/hr over 60 Minutes Intravenous STAT 09/20/20 1714 09/20/20 1954   09/20/20 0400  vancomycin (VANCOREADY) IVPB 750 mg/150 mL  Status:  Discontinued        750 mg 150 mL/hr over 60 Minutes Intravenous Every 12 hours 09/19/20 1932 09/20/20 1155   09/20/20 0000  ceFEPIme (MAXIPIME) 2 g in sodium chloride 0.9 % 100 mL IVPB  Status:  Discontinued        2 g 200 mL/hr over 30 Minutes Intravenous Every 8 hours 09/19/20 1932 09/23/20 0721   09/19/20 1600  ceFEPIme (MAXIPIME) 2 g in sodium chloride 0.9 % 100 mL IVPB        2 g 200 mL/hr over 30 Minutes Intravenous  Once 09/19/20 1548 09/19/20 1634   09/19/20 1600  vancomycin (VANCOREADY) IVPB 1250 mg/250 mL        1,250 mg 166.7 mL/hr over 90 Minutes Intravenous STAT 09/19/20 1557 09/19/20 1822          Subjective: Patient resting in bed able to answer questions appropriately nods head yes or no  Objective: Vitals:   10/25/20 1206 10/25/20 1207 10/25/20 1208 10/25/20 1209  BP:   91/62   Pulse: 64 66 69 66  Resp:      Temp:      TempSrc:      SpO2: 100% 100% 100% 100%  Weight:      Height:        Intake/Output Summary (Last 24 hours) at 10/25/2020 1345 Last data filed at 10/25/2020 1203 Gross per 24 hour  Intake 2080.59 ml  Output 2075 ml  Net 5.59 ml    Filed Weights   10/21/20 0500 10/22/20 0430 10/25/20  0500  Weight: 63.2 kg 62.9 kg 65.6 kg    Examination:  General exam: Appears calm and comfortable on vent  Respiratory system: Few scattered rhonchi bilaterally respiratory effort normal. Cardiovascular system: S1 & S2 heard, RRR. No JVD, murmurs, rubs, gallops or clicks. No pedal edema. Gastrointestinal system: Abdomen is nondistended, soft and nontender. No organomegaly or masses felt. Normal bowel sounds heard. Central nervous system: Alert and oriented. No focal neurological deficits. Extremities: Symmetric 5 x 5 power. Skin: No rashes, lesions or ulcers Psychiatry: Judgement and insight appear normal. Mood & affect appropriate.     Data Reviewed: I have personally reviewed following labs and imaging studies  CBC: Recent Labs  Lab 10/20/20 1400 10/21/20 0527  WBC 7.3 7.9  HGB 9.7* 9.7*  HCT 31.8* 32.1*  MCV 96.4 97.0  PLT 145* 143*    Basic Metabolic Panel: Recent Labs  Lab 10/21/20 0527 10/22/20 0235 10/23/20 0347 10/24/20 0445 10/25/20 0505  NA 140 140 139 139 139  K 4.5 4.6 4.2 4.5 4.6  CL 94* 95* 94* 93* 92*  CO2 41* 38* 37* 39* 38*  GLUCOSE 127* 152* 135* 136* 88  BUN 28* 26* 24* 26* 26*  CREATININE 0.61 0.58* 0.54* 0.55* 0.71  CALCIUM 10.6* 10.5* 10.4* 10.5* 10.6*  PHOS 4.3 4.0 4.5 4.1 4.3    GFR: Estimated Creatinine Clearance: 80.1 mL/min (by C-G formula based on SCr of 0.71 mg/dL). Liver Function Tests: Recent Labs  Lab  10/21/20 0527 10/22/20 0235 10/23/20 0347 10/24/20 0445 10/25/20 0505  ALBUMIN 3.8 3.7 3.7 3.5 3.6    No results for input(s): LIPASE, AMYLASE in the last 168 hours. No results for input(s): AMMONIA in the last 168 hours. Coagulation Profile: No results for input(s): INR, PROTIME in the last 168 hours. Cardiac Enzymes: No results for input(s): CKTOTAL, CKMB, CKMBINDEX, TROPONINI in the last 168 hours. BNP (last 3 results) No results for input(s): PROBNP in the last 8760 hours. HbA1C: No results for input(s): HGBA1C in the last 72 hours. CBG: Recent Labs  Lab 10/24/20 2003 10/24/20 2302 10/25/20 0301 10/25/20 0808 10/25/20 1121  GLUCAP 148* 115* 128* 161* 109*    Lipid Profile: No results for input(s): CHOL, HDL, LDLCALC, TRIG, CHOLHDL, LDLDIRECT in the last 72 hours. Thyroid Function Tests: No results for input(s): TSH, T4TOTAL, FREET4, T3FREE, THYROIDAB in the last 72 hours. Anemia Panel: No results for input(s): VITAMINB12, FOLATE, FERRITIN, TIBC, IRON, RETICCTPCT in the last 72 hours. Sepsis Labs: No results for input(s): PROCALCITON, LATICACIDVEN in the last 168 hours.  No results found for this or any previous visit (from the past 240 hour(s)).       Radiology Studies: No results found.      Scheduled Meds:  chlorhexidine gluconate (MEDLINE KIT)  15 mL Mouth Rinse BID   Chlorhexidine Gluconate Cloth  6 each Topical Q0600   cholecalciferol  10,000 Units Oral Daily   enoxaparin (LOVENOX) injection  40 mg Subcutaneous Q24H   feeding supplement (PROSource TF)  45 mL Per Tube Daily   free water  100 mL Per Tube Q6H   insulin aspart  0-9 Units Subcutaneous Q4H   insulin glargine  10 Units Subcutaneous QHS   mouth rinse  15 mL Mouth Rinse BID   midodrine  10 mg Oral TID WC   multivitamin with minerals  1 tablet Oral Daily   pantoprazole  40 mg Oral Daily   sertraline  100 mg Oral Daily   sodium chloride flush  10-40 mL  Intracatheter Q12H   traZODone   100 mg Oral QHS   Continuous Infusions:  sodium chloride 10 mL/hr (10/21/20 0800)   feeding supplement (OSMOLITE 1.2 CAL) 1,000 mL (10/25/20 0506)   vancomycin Stopped (10/24/20 2322)     LOS: 36 days    Time spent: 46 min    Georgette Shell, MD 10/25/2020, 1:45 PM

## 2020-10-26 LAB — RENAL FUNCTION PANEL
Albumin: 3.5 g/dL (ref 3.5–5.0)
Anion gap: 8 (ref 5–15)
BUN: 32 mg/dL — ABNORMAL HIGH (ref 8–23)
CO2: 39 mmol/L — ABNORMAL HIGH (ref 22–32)
Calcium: 10.8 mg/dL — ABNORMAL HIGH (ref 8.9–10.3)
Chloride: 94 mmol/L — ABNORMAL LOW (ref 98–111)
Creatinine, Ser: 0.66 mg/dL (ref 0.61–1.24)
GFR, Estimated: >60 mL/min (ref >60)
Glucose, Bld: 119 mg/dL — ABNORMAL HIGH (ref 70–99)
Phosphorus: 4.4 mg/dL (ref 2.5–4.6)
Potassium: 4.6 mmol/L (ref 3.5–5.1)
Sodium: 141 mmol/L (ref 135–145)

## 2020-10-26 LAB — GLUCOSE, CAPILLARY
Glucose-Capillary: 106 mg/dL — ABNORMAL HIGH (ref 70–99)
Glucose-Capillary: 123 mg/dL — ABNORMAL HIGH (ref 70–99)
Glucose-Capillary: 132 mg/dL — ABNORMAL HIGH (ref 70–99)
Glucose-Capillary: 134 mg/dL — ABNORMAL HIGH (ref 70–99)
Glucose-Capillary: 134 mg/dL — ABNORMAL HIGH (ref 70–99)
Glucose-Capillary: 143 mg/dL — ABNORMAL HIGH (ref 70–99)

## 2020-10-26 MED ORDER — INSULIN GLARGINE 100 UNIT/ML ~~LOC~~ SOLN: 10.0000 [IU] | Freq: Every day | SUBCUTANEOUS | 11 refills | Status: AC

## 2020-10-26 MED ORDER — CHLORHEXIDINE GLUCONATE CLOTH 2 % EX PADS: 6.0000 | MEDICATED_PAD | Freq: Every day | CUTANEOUS | Status: DC

## 2020-10-26 MED ORDER — LORAZEPAM 0.5 MG PO TABS: 0.5000 mg | ORAL_TABLET | Freq: Four times a day (QID) | ORAL | 0 refills | Status: AC | PRN

## 2020-10-26 MED ORDER — SERTRALINE HCL 100 MG PO TABS: 100.0000 mg | ORAL_TABLET | Freq: Every day | ORAL | 2 refills | Status: AC

## 2020-10-26 MED ORDER — MIDODRINE HCL 10 MG PO TABS: 10.0000 mg | ORAL_TABLET | Freq: Three times a day (TID) | ORAL | 2 refills | Status: AC

## 2020-10-26 MED ORDER — VANCOMYCIN IV (FOR PTA / DISCHARGE USE ONLY): 1000.0000 mg | INTRAVENOUS | 0 refills | Status: AC

## 2020-10-26 NOTE — Progress Notes (Signed)
PT denies need for trach suctioning at this time. 

## 2020-10-26 NOTE — Progress Notes (Signed)
Patient's wife called and confirmed that she is home to received the patient. Patient left with all his belongings and his home vent.

## 2020-10-26 NOTE — TOC Transition Note (Signed)
Transition of Care [facility]) - CM/SW Discharge Note   Patient Details  Name: Steve [name] MRN: [id number] Date of Birth: Apr 18, 1955  [facility] CM/SW Contact:  Leeroy Cha, RN Phone Number: 10/26/2020, 3:03 PM   Clinical Narrative:    Vent obtained through [facility], pam chandler with [employer] will be doing and handling the iv abx.  Med. Necessity form completed.  RN is aware to call ptar when pt is ready to return to home.   Final next level of care: [place] Barriers to Discharge: Continued Medical Work up   Patient Goals and CMS Choice Patient states their goals for this hospitalization and ongoing recovery are:: Return home and get stronger CMS Medicare.gov Compare Post Acute Care list provided to:: Patient Represenative (must comment) Choice offered to / list presented to : Spouse  Discharge Placement                       Discharge Plan and Services In-house Referral: Clinical Social Work Discharge Planning Services: CM Consult Post Acute Care Choice: Home Health          DME Arranged: N/A DME Agency: NA       HH Arranged: PT [place] Agency: [place] ([facility]) Date HH Agency Contacted: 09/22/20   Representative spoke with at [place]: [place] Determinants of Health ([place]) Interventions     Readmission Risk Interventions Readmission Risk Prevention Plan 09/22/2020 08/24/2020 07/22/2019  Transportation Screening Complete Complete Complete  Medication Review Press photographer) Complete Complete Complete  HRI or Home Care Consult Complete Complete -  SW Recovery Care/Counseling Consult Complete Complete -  Palliative Care Screening Not Applicable Not Applicable -  [place] Not Applicable Not Applicable -  Some recent data might be hidden

## 2020-10-26 NOTE — Progress Notes (Signed)
Physical Therapy Treatment Patient Details Name: Steve [name] MRN: [id number] DOB: 11-26-1955 Today's Date: 10/26/2020    History of Present Illness Patient  is a 65 year old male who was admitted for possible pneumonia and found to have a staph epidermidis bacteremia. 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  0n 09/24/20, developed respiratory distress with increased oxygen requirement and was transferred to ICU and started on pressure support ventilation. Currently on trach collar on 10L fiO2 at 60% with intermittent vent support.    PT Comments    Pt semi-supine in bed. Encouraged pt transfer OOB to recliner, pt declined. Pt agreeable to practice stand/pivot transfer to [facility]. Pt supervision from supine to sit on EOB. No complaints of dizziness. Min assist +2, for line management, for stand/pivot transfers to and from [facility]. Pt supervision from sit to supine from EOB. LE TE focused on ROM and strength. O2 sats stable, avg 95% throughout session.    Follow Up Recommendations  Home health PT     Equipment Recommendations  None recommended by PT    Recommendations for Other Services       Precautions / Restrictions Precautions Precautions: Fall Precaution Comments: trach collar fiO2 60%/10 L Restrictions Weight Bearing Restrictions: No    Mobility  Bed Mobility Overal bed mobility: Needs Assistance Bed Mobility: Supine to Sit     Supine to sit: Supervision;HOB elevated Sit to supine: Supervision        Transfers Overall transfer level: Needs assistance Equipment used: 1 person hand held assist Transfers: Sit to/from [facility] Sit to Stand: Min assist;+2 safety/equipment Stand  pivot transfers: Min guard;+2 safety/equipment       General transfer comment: able to rise from edge of bed, min for safety and x2 for line management  Ambulation/Gait             General Gait Details: no amb. focused session on transfers for home D/C carryover and safety   Stairs             Wheelchair Mobility    Modified Rankin (Stroke Patients Only)       Balance Overall balance assessment: Needs assistance Sitting-balance support: Feet supported Sitting balance-Leahy Scale: Fair                                      Cognition Arousal/Alertness: Awake/alert Behavior During Therapy: WFL for tasks assessed/performed Overall Cognitive Status: Within Functional Limits for tasks assessed                                 General Comments: pleasant and motivated.  TRACH but does mouth words and use hand gestures, spoke out loud some today.      Exercises General Exercises - Lower Extremity Ankle Circles/Pumps: AROM;PROM;20 reps;Both Heel Slides: AROM;Both;10 reps;Supine Hip ABduction/ADduction: AROM;Both;10 reps;Supine Straight Leg Raises: AROM;Both;20 reps;Supine    General Comments        Pertinent Vitals/Pain Pain Assessment: No/denies pain    Home Living  Prior Function            PT Goals (current goals can now be found in the care plan section) Acute Rehab PT Goals Patient Stated Goal: walk to bathroom PT Goal Formulation: With patient Time For Goal Achievement: 11/05/20 Potential to Achieve Goals: Good Progress towards PT goals: Progressing toward goals    Frequency    Min 3X/week      PT Plan Current plan remains appropriate    Co-evaluation              AM-PAC PT "6 Clicks" Mobility   Outcome Measure  Help needed turning from your back to your side while in a flat bed without using bedrails?: A Little Help needed moving from lying on your back to sitting on  the side of a flat bed without using bedrails?: A Little Help needed moving to and from a bed to a chair (including a wheelchair)?: A Little Help needed standing up from a chair using your arms (e.g., wheelchair or bedside chair)?: A Little Help needed to walk in hospital room?: A Lot Help needed climbing 3-5 steps with a railing? : Total 6 Click Score: 15    End of Session Equipment Utilized During Treatment: Gait belt Activity Tolerance: Patient tolerated treatment well Patient left: in bed;with call bell/phone within reach;with bed alarm set Nurse Communication: Mobility status PT Visit Diagnosis: Unsteadiness on feet (R26.81);Muscle weakness (generalized) (M62.81)     Time: 8088-1103 PT Time Calculation (min) (ACUTE ONLY): 25 min  Charges:  $Therapeutic Exercise: 8-22 mins $Therapeutic Activity: 8-22 mins                    Ernst Spell, PTA Student  Acute Rehabilitation Services Pager : [phone] Office : [place] 10/26/2020, 12:28 PM

## 2020-10-26 NOTE — Progress Notes (Signed)
PT sleeping at this time

## 2020-10-26 NOTE — Progress Notes (Signed)
Patient left with PTAR at 2110.

## 2020-10-26 NOTE — Discharge Summary (Signed)
Physician Discharge Summary  Steve [name] JKD:[id number] DOB: 12/02/1955 DOA: 09/19/2020  PCP: Steve Pai, PA-C  Admit date: 09/19/2020 Discharge date: 10/26/2020  Admitted From: Home Disposition: Home  Recommendations for Outpatient Follow-up:  Follow up with PCP in 1-2 weeks Please obtain BMP/CBC in one week Please follow up oncology, pulmonary  Home Health: Yes Equipment/Devices: Trilogy vent  Discharge Condition: Stable CODE STATUS: Full code Diet recommendation: Tube feeds Brief/Interim Summary: He has been admitted in the hospital for 36 days. I have summarized his hospital stay as below. This is a very sick patient with multiple comorbidities including chronic tracheostomy prolonged hospitalization in 2021 due to COVID-pneumonia ARDS pulmonary fibrosis status post PEG tube type 2 diabetes hypertension Hodgkin's lymphoma was on chemotherapy he was admitted with poor appetite malaise decreased p.o. intake and was found to have staph epidermidis bacteremia.  He was seen in consultation by infectious disease PCCM.  ID recommended a transesophageal echo but patient was too unstable to go through a transesophageal echo.  They recommended 6 weeks of IV antibiotics from the first negative blood culture which was on 09/22/2020.   Discharge Diagnoses:  Principal Problem:   Failure to thrive in adult Active Problems:   Pressure ulcer   Tracheostomy dependence ([name])   Chronic respiratory failure with hypoxia ([name])   Hodgkin's lymphoma ([name])   Essential hypertension   Bacteremia   Respiratory distress  #1 acute on chronic respiratory failure with tracheostomy in the setting of interstitial lung disease and pulmonary fibrosis.  He is on 8 L of oxygen chronically at baseline.  Currently he is vent dependent. Patient and his wife does not want him to go to [facility] they want him to go home with triligy/vent. He was tried on trilogy vent 3 nights during his hospital stay which she  tolerated well.  Wife is confident that she is able to help him with his went PICC line and IV antibiotics.  He was seen by physical therapy recommended SNF however patient and family did not want to go to long-term care. Patient had agreed briefly for DO NOT RESUSCITATE however after discussion with his wife patient changed his desire and wanted to be a full code.  He was also seen by palliative care during this hospital stay. #2 sepsis secondary to staph epidermis bacteremia/pneumonia present on admission.  He met criteria for sepsis on admission with tachypnea tachycardia with recurrent pneumonia and leukocytosis.  CT of the chest revealed interval increase in bulk of mediastinal and axillary lymphadenopathy.  Port-A-Cath removed 09/24/2020.  ID recommended vancomycin for 6 weeks from date 09/22/2020.   #3 relapsed Hodgkin's lymphoma followed by Dr. Marin Olp.  He is not planning on any chemotherapy at this time until his functional status improves and he is back to his baseline off the vent.  4.  Type 2 diabetes A1c is 7.2.  Continue Lantus. CBG (last 3)  Recent Labs (last 2 labs)        Recent Labs    10/25/20 0301 10/25/20 0808 10/25/20 1121  GLUCAP 128* 161* 109*         #5 hyponatremia and hypokalemia repleted.   #6 protein calorie malnutrition due to chronic illness and malignancy status post IV albumin x1.   #7 history of depression anxiety on Zoloft.   #8 hypotension he received albumin x1.  On midodrine 10 mg 3 times a day.   #9 pressure injury on the sacrum present on admission see below     Pressure Injury  08/16/20 Sacrum Lower;Medial Deep Tissue Pressure Injury - Purple or maroon localized area of discolored intact skin or blood-filled blister due to damage of underlying soft tissue from pressure and/or shear. (Active)  08/16/20 1200  Location: Sacrum  Location Orientation: Lower;Medial  Staging: Deep Tissue Pressure Injury - Purple or maroon localized area of  discolored intact skin or blood-filled blister due to damage of underlying soft tissue from pressure and/or shear.  Wound Description (Comments):   Present on Admission: Yes     Pressure Injury 09/27/20 Sacrum Mid Stage 1 -  Intact skin with non-blanchable redness of a localized area usually over a bony prominence. redness, looks like MASD (Active)  09/27/20 0800  Location: Sacrum  Location Orientation: Mid  Staging: Stage 1 -  Intact skin with non-blanchable redness of a localized area usually over a bony prominence.  Wound Description (Comments): redness, looks like MASD  Present on Admission:       Nutrition Problem: Severe Malnutrition Etiology: chronic illness, cancer and cancer related treatments    Signs/Symptoms: severe fat depletion, severe muscle depletion     Interventions: Tube feeding, Prostat, MVI  Estimated body mass index is 23.44 kg/m as calculated from the following:   Height as of this encounter: 5' 5"  (1.651 m).   Weight as of this encounter: 63.9 kg.  Discharge Instructions  Discharge Instructions     [facility] pharmacist to adjust dose for Vancomycin, Aminoglycosides and other anti-infective therapies as requested by physician.   Complete by: As directed    [facility] to provide Cath Flo 62m   Complete by: As directed    Administer for PICC line occlusion and as ordered by physician for other access device issues.   Anaphylaxis Kit: Provided to treat any anaphylactic reaction to the medication being provided to the patient if First Dose or when requested by physician   Complete by: As directed    Epinephrine 176mml vial / amp: Administer 0.45m31m0.45ml67mubcutaneously once for moderate to severe anaphylaxis, nurse to call physician and pharmacy when reaction occurs and call 911 if needed for immediate care   Diphenhydramine 50mg75mIV vial: Administer 25-50mg 21mM PRN for first dose reaction, rash, itching, mild reaction, nurse  to call physician and pharmacy when reaction occurs   Sodium Chloride 0.9% NS 500ml I74mdminister if needed for hypovolemic blood pressure drop or as ordered by physician after call to physician with anaphylactic reaction   Change dressing on IV access line weekly and PRN   Complete by: As directed    Diet - low sodium heart healthy   Complete by: As directed    Diet - low sodium heart healthy   Complete by: As directed    Discharge wound care:   Complete by: As directed    See dc order   Discharge wound care:   Complete by: As directed    See orders   Flush IV access with Sodium Chloride 0.9% and Heparin 10 units/ml or 100 units/ml   Complete by: As directed    Home infusion instructions - [facility]   Complete by: As directed    Instructions: Flush IV access with Sodium Chloride 0.9% and Heparin 10units/ml or 100units/ml   Change dressing on IV access line: Weekly and PRN   Instructions Cath Flo 2mg: Ad25mister for PICC Line occlusion and as ordered by physician for other access device   [facility] pharmacist to adjust dose for: Vancomycin, Aminoglycosides and other anti-infective  therapies as requested by physician   Increase activity slowly   Complete by: As directed    Increase activity slowly   Complete by: As directed    Method of administration may be changed at the discretion of home infusion pharmacist based upon assessment of the patient and/or caregiver's ability to self-administer the medication ordered   Complete by: As directed       Allergies as of 10/26/2020   No Known Allergies      Medication List     STOP taking these medications    Chlorhexidine Gluconate Cloth 2 % Pads   furosemide 20 MG tablet Commonly known as: LASIX       TAKE these medications    albuterol (2.5 MG/3ML) 0.083% nebulizer solution Commonly known as: PROVENTIL Take 3 mLs (2.5 mg total) by nebulization every 2 (two) hours as needed for wheezing.    calamine lotion Apply topically as needed for itching. What changed:  how much to take when to take this   chlorhexidine 0.12 % solution Commonly known as: PERIDEX 15 mLs by Mouth Rinse route 2 (two) times daily. What changed:  when to take this reasons to take this   feeding supplement (OSMOLITE 1.2 CAL) Liqd Place 1,000 mLs into feeding tube continuous.   feeding supplement (PRO-STAT SUGAR FREE 64) Liqd Place 30 mLs into feeding tube daily.   guaiFENesin 100 MG/5ML Soln Commonly known as: ROBITUSSIN Place 5 mLs (100 mg total) into feeding tube every 4 (four) hours as needed for cough or to loosen phlegm.   Hyosyne 0.125 MG/ML solution Generic drug: hyoscyamine Take 0.125 mg by mouth every 4 (four) hours as needed for cramping.   insulin glargine 100 UNIT/ML injection Commonly known as: LANTUS Inject 0.1 mLs (10 Units total) into the skin at bedtime.   lidocaine-prilocaine cream Commonly known as: EMLA Apply to affected area once What changed:  how much to take how to take this when to take this reasons to take this   loperamide HCl 1 MG/7.5ML suspension Commonly known as: IMODIUM Place 15 mLs (2 mg total) into feeding tube as needed for diarrhea or loose stools (maximum 31m per day). What changed: when to take this   LORazepam 0.5 MG tablet Commonly known as: Ativan Take 1 tablet (0.5 mg total) by mouth every 6 (six) hours as needed (Nausea or vomiting).   midodrine 10 MG tablet Commonly known as: PROAMATINE Take 1 tablet (10 mg total) by mouth 3 (three) times daily with meals. What changed:  medication strength how much to take   multivitamin Liqd Place 15 mLs into feeding tube daily.   ondansetron 4 MG tablet Commonly known as: ZOFRAN Take 1 tablet (4 mg total) by mouth every 6 (six) hours as needed for nausea.   pantoprazole 40 MG tablet Commonly known as: PROTONIX Take 40 mg by mouth daily.   pantoprazole sodium 40 mg/20 mL Pack Commonly  known as: PROTONIX Place 20 mLs (40 mg total) into feeding tube daily.   sertraline 100 MG tablet Commonly known as: ZOLOFT Take 1 tablet (100 mg total) by mouth daily. What changed:  medication strength See the new instructions.   traZODone 100 MG tablet Commonly known as: DESYREL PLACE 1 TABLET (100 MG TOTAL) INTO FEEDING TUBE AT BEDTIME. What changed: how to take this   triamcinolone cream 0.1 % Commonly known as: KENALOG Apply 1 application topically 2 (two) times daily as needed (itching).   vancomycin  IVPB Inject 1,000 mg into  the vein daily for 19 days. Indication:  MRSE Bacteremia  First Dose: No Last Day of Therapy:  11/03/2020 Labs - Sunday/Monday:  CBC/D, BMP, and vancomycin trough. Labs - Thursday:  BMP and vancomycin trough Labs - Every other week:  ESR and CRP Method of administration:Elastomeric Method of administration may be changed at the discretion of the patient and/or caregiver's ability to self-administer the medication ordered.   Vitamin D3 250 MCG (10000 UT) capsule Take 10,000 Units by mouth daily.               Discharge Care Instructions  (From admission, onward)           Start     Ordered   10/26/20 0000  Change dressing on IV access line weekly and PRN  (Home infusion instructions - [facility] )        07 /18/22 0829   10/26/20 0000  Discharge wound care:       Comments: See dc order   10/26/20 0829   10/26/20 0000  Discharge wound care:       Comments: See orders   10/26/20 0831            Follow-up Information     Saguier, Percell Miller, PA-C Follow up.   Specialties: Internal Medicine, Family Medicine Contact information: [address] [address] [place] [address] [phone]         Juanito Doom, MD Follow up.   Specialty: Pulmonary Disease Contact information: [place] [address] [phone]         Volanda Napoleon, MD Follow up.   Specialty:  Oncology Contact information: [address] [place] [address] [phone]                No Known Allergies  Consultations: Infectious disease, PCCM, palliative care   Procedures/Studies: [place] EKG SITE RITE  Result Date: 10/06/2020 If Site Rite image not attached, placement could not be confirmed due to current cardiac rhythm.  (Echo, Carotid, EGD, Colonoscopy, ERCP)    Subjective: Patient resting in bed he is awake alert able to follow commands answer questions appropriately he is able to write down what he Cannot speak he thinks he is ready to go home and his wife is ready for him.  He is anxious to go home.  Discharge Exam: Vitals:   10/26/20 0600 10/26/20 0808  BP: (!) 99/51 (!) 92/50  Pulse: 72 87  Resp: (!) 28 (!) 21  Temp:  98.1 F (36.7 C)  SpO2: 97% 92%   Vitals:   10/26/20 0400 10/26/20 0425 10/26/20 0600 10/26/20 0808  BP: (!) 93/54 (!) 93/54 (!) 99/51 (!) 92/50  Pulse: 84 86 72 87  Resp: 20 (!) 30 (!) 28 (!) 21  Temp:    98.1 F (36.7 C)  TempSrc:    Oral  SpO2: 96% 95% 97% 92%  Weight:      Height:        General: Pt is alert, awake, not in acute distress Cardiovascular: RRR, S1/S2 +, no rubs, no gallops Respiratory: Scattered rhonchi bilaterally, no wheezing, no rhonchi Abdominal: Soft, NT, ND, bowel sounds + PEG tube in place Extremities: no edema, no cyanosis    The results of significant diagnostics from this hospitalization (including imaging, microbiology, ancillary and laboratory) are listed below for reference.     Microbiology: No results found for this or any previous visit (from the  past 240 hour(s)).   Labs: BNP (last 3 results) Recent Labs    07/21/20 0605 10/06/20 0237  BNP 141.5* 61.4   Basic Metabolic Panel: Recent Labs  Lab 10/22/20 0235 10/23/20 0347 10/24/20 0445 10/25/20 0505 10/26/20 0352  NA 140 139 139 139 141  K 4.6 4.2 4.5 4.6 4.6  CL 95* 94* 93* 92* 94*  CO2 38* 37* 39*  38* 39*  GLUCOSE 152* 135* 136* 88 119*  BUN 26* 24* 26* 26* 32*  CREATININE 0.58* 0.54* 0.55* 0.71 0.66  CALCIUM 10.5* 10.4* 10.5* 10.6* 10.8*  PHOS 4.0 4.5 4.1 4.3 4.4   Liver Function Tests: Recent Labs  Lab 10/22/20 0235 10/23/20 0347 10/24/20 0445 10/25/20 0505 10/26/20 0352  ALBUMIN 3.7 3.7 3.5 3.6 3.5   No results for input(s): LIPASE, AMYLASE in the last 168 hours. No results for input(s): AMMONIA in the last 168 hours. CBC: Recent Labs  Lab 10/20/20 1400 10/21/20 0527 10/25/20 1353  WBC 7.3 7.9 10.2  HGB 9.7* 9.7* 10.1*  HCT 31.8* 32.1* 32.9*  MCV 96.4 97.0 95.6  PLT 145* 143* 199   Cardiac Enzymes: No results for input(s): CKTOTAL, CKMB, CKMBINDEX, TROPONINI in the last 168 hours. BNP: Invalid input(s): POCBNP CBG: Recent Labs  Lab 10/25/20 1608 10/25/20 2010 10/26/20 0032 10/26/20 0337 10/26/20 0816  GLUCAP 96 145* 132* 134* 123*   D-Dimer No results for input(s): DDIMER in the last 72 hours. Hgb A1c No results for input(s): HGBA1C in the last 72 hours. Lipid Profile No results for input(s): CHOL, HDL, LDLCALC, TRIG, CHOLHDL, LDLDIRECT in the last 72 hours. Thyroid function studies No results for input(s): TSH, T4TOTAL, T3FREE, THYROIDAB in the last 72 hours.  Invalid input(s): FREET3 Anemia work up No results for input(s): VITAMINB12, FOLATE, FERRITIN, TIBC, IRON, RETICCTPCT in the last 72 hours. Urinalysis    Component Value Date/Time   COLORURINE YELLOW 09/19/2020 1511   APPEARANCEUR HAZY (A) 09/19/2020 1511   LABSPEC 1.012 09/19/2020 1511   PHURINE 7.0 09/19/2020 1511   GLUCOSEU NEGATIVE 09/19/2020 1511   HGBUR NEGATIVE 09/19/2020 1511   HGBUR negative 03/23/2007 0000   BILIRUBINUR NEGATIVE 09/19/2020 1511   BILIRUBINUR neg 11/11/2016 1017   KETONESUR NEGATIVE 09/19/2020 1511   PROTEINUR NEGATIVE 09/19/2020 1511   UROBILINOGEN 0.2 11/11/2016 1017   UROBILINOGEN 0.2 11/08/2012 0915   NITRITE NEGATIVE 09/19/2020 1511    LEUKOCYTESUR NEGATIVE 09/19/2020 1511   Sepsis Labs Invalid input(s): PROCALCITONIN,  WBC,  LACTICIDVEN Microbiology No results found for this or any previous visit (from the past 240 hour(s)).   Time coordinating discharge: 39 minutes  SIGNED:   Georgette Shell, MD  [facility] 10/26/2020, 10:44 AM

## 2020-10-29 ENCOUNTER — Telehealth: Payer: Self-pay | Admitting: Emergency Medicine

## 2020-10-29 ENCOUNTER — Inpatient Hospital Stay: Payer: BC Managed Care – PPO | Admitting: Emergency Medicine

## 2020-10-29 NOTE — Telephone Encounter (Signed)
ATC left voicemail to return call to office. Pt needs to be set up with an appointment (can be televisit) with either Dr. [name] of NP. Will attempt to call at latter time.

## 2020-10-30 ENCOUNTER — Encounter: Payer: Self-pay | Admitting: Hematology & Oncology

## 2020-10-30 ENCOUNTER — Telehealth: Payer: Self-pay | Admitting: Medical

## 2020-10-30 NOTE — Telephone Encounter (Signed)
Caller: [place] Call back # [phone]  Verbal order - PT  1 time a week for 1 week 2 time a week for 4 week 1 time a week for 4 week  Ok to leave msg

## 2020-10-30 NOTE — Telephone Encounter (Signed)
VO given to [facility]

## 2020-11-03 ENCOUNTER — Encounter: Payer: Self-pay | Admitting: *Deleted

## 2020-11-03 NOTE — Progress Notes (Signed)
Patient has been discharged from the hospital and is on a vent. Spoke with Dr Marin Olp regarding follow up with this office. Due to his performance status, he is not currently a candidate for any Hodgkin's treatment. At this time no scheduled follow up is needed.   Per Dr Marin Olp, he will call the patient's wife to update her to plan. Will follow up in approx 4 weeks to determine if there is any change in status.   Oncology Nurse Navigator Documentation  Oncology Nurse Navigator Flowsheets 11/03/2020  Abnormal Finding Date -  Confirmed Diagnosis Date -  Diagnosis Status -  Planned Course of Treatment -  Phase of Treatment -  Chemotherapy Pending- Reason: -  Chemotherapy Actual Start Date: -  Navigator Follow Up Date: 12/01/2020  Navigator Follow Up Reason: Appointment Review  Navigator Location [facility]  Referral Date to RadOnc/MedOnc -  Navigator Encounter Type Appt/Treatment Plan Review  Telephone -  Treatment Initiated Date -  Patient Visit Type MedOnc  Treatment Phase Active Tx  Barriers/Navigation Needs Coordination of Care;Education;Employed;Family Concerns;Morbidities/Frailty;Multiple Hospital Admissions  Education -  Interventions None Required  Acuity Level 4-High Needs (Greater Than 4 Barriers Identified)  Referrals -  Coordination of Care -  Education Method -  Support Groups/Services Friends and Family  Time Spent with Patient 15

## 2020-11-05 ENCOUNTER — Telehealth: Payer: Self-pay

## 2020-11-05 NOTE — Telephone Encounter (Signed)
Received call from [facility] requesting pull PICC orders as abx ended 7/26. Per Janene Madeira, ok to pull PICC. Requested follow up in 7-10 days. Dr. West Bali nor Dr. Baxter Flattery are available for follow up so patient was scheduled with Dr. Tommy Medal as he's first available.   Janeisha Ryle Lorita Officer, RN

## 2020-11-05 NOTE — Telephone Encounter (Signed)
Home health nursing wants to know if they can pull the picc line from right arm , because he has completed his antibiotics.     [phone]

## 2020-11-09 ENCOUNTER — Encounter (HOSPITAL_COMMUNITY): Payer: Self-pay | Admitting: Internal Medicine

## 2020-11-09 ENCOUNTER — Inpatient Hospital Stay (HOSPITAL_COMMUNITY): Payer: BC Managed Care – PPO

## 2020-11-09 ENCOUNTER — Inpatient Hospital Stay (HOSPITAL_COMMUNITY)
Admission: EM | Admit: 2020-11-09 | Discharge: 2020-12-10 | DRG: 870 | Disposition: E | Payer: BC Managed Care – PPO | Attending: Pulmonary Disease | Admitting: Pulmonary Disease

## 2020-11-09 ENCOUNTER — Emergency Department (HOSPITAL_COMMUNITY): Payer: BC Managed Care – PPO

## 2020-11-09 ENCOUNTER — Other Ambulatory Visit: Payer: Self-pay

## 2020-11-09 DIAGNOSIS — J9601 Acute respiratory failure with hypoxia: Secondary | ICD-10-CM

## 2020-11-09 DIAGNOSIS — Z515 Encounter for palliative care: Secondary | ICD-10-CM

## 2020-11-09 DIAGNOSIS — J84112 Idiopathic pulmonary fibrosis: Secondary | ICD-10-CM | POA: Diagnosis present

## 2020-11-09 DIAGNOSIS — K521 Toxic gastroenteritis and colitis: Secondary | ICD-10-CM | POA: Diagnosis not present

## 2020-11-09 DIAGNOSIS — E872 Acidosis: Secondary | ICD-10-CM | POA: Diagnosis present

## 2020-11-09 DIAGNOSIS — Z20822 Contact with and (suspected) exposure to covid-19: Secondary | ICD-10-CM | POA: Diagnosis present

## 2020-11-09 DIAGNOSIS — A4152 Sepsis due to Pseudomonas: Principal | ICD-10-CM | POA: Diagnosis present

## 2020-11-09 DIAGNOSIS — T380X5A Adverse effect of glucocorticoids and synthetic analogues, initial encounter: Secondary | ICD-10-CM | POA: Diagnosis present

## 2020-11-09 DIAGNOSIS — K567 Ileus, unspecified: Secondary | ICD-10-CM | POA: Diagnosis present

## 2020-11-09 DIAGNOSIS — E871 Hypo-osmolality and hyponatremia: Secondary | ICD-10-CM | POA: Diagnosis present

## 2020-11-09 DIAGNOSIS — Z7401 Bed confinement status: Secondary | ICD-10-CM

## 2020-11-09 DIAGNOSIS — J189 Pneumonia, unspecified organism: Secondary | ICD-10-CM

## 2020-11-09 DIAGNOSIS — R6520 Severe sepsis without septic shock: Secondary | ICD-10-CM | POA: Diagnosis not present

## 2020-11-09 DIAGNOSIS — R64 Cachexia: Secondary | ICD-10-CM | POA: Diagnosis present

## 2020-11-09 DIAGNOSIS — U099 Post covid-19 condition, unspecified: Secondary | ICD-10-CM | POA: Diagnosis present

## 2020-11-09 DIAGNOSIS — E1165 Type 2 diabetes mellitus with hyperglycemia: Secondary | ICD-10-CM | POA: Diagnosis present

## 2020-11-09 DIAGNOSIS — J9622 Acute and chronic respiratory failure with hypercapnia: Secondary | ICD-10-CM | POA: Diagnosis present

## 2020-11-09 DIAGNOSIS — Z930 Tracheostomy status: Secondary | ICD-10-CM | POA: Diagnosis not present

## 2020-11-09 DIAGNOSIS — Z9911 Dependence on respirator [ventilator] status: Secondary | ICD-10-CM | POA: Diagnosis not present

## 2020-11-09 DIAGNOSIS — N179 Acute kidney failure, unspecified: Secondary | ICD-10-CM | POA: Diagnosis present

## 2020-11-09 DIAGNOSIS — A419 Sepsis, unspecified organism: Secondary | ICD-10-CM | POA: Diagnosis not present

## 2020-11-09 DIAGNOSIS — J8410 Pulmonary fibrosis, unspecified: Secondary | ICD-10-CM

## 2020-11-09 DIAGNOSIS — Z66 Do not resuscitate: Secondary | ICD-10-CM | POA: Diagnosis present

## 2020-11-09 DIAGNOSIS — J918 Pleural effusion in other conditions classified elsewhere: Secondary | ICD-10-CM | POA: Diagnosis present

## 2020-11-09 DIAGNOSIS — J151 Pneumonia due to Pseudomonas: Secondary | ICD-10-CM | POA: Diagnosis present

## 2020-11-09 DIAGNOSIS — E870 Hyperosmolality and hypernatremia: Secondary | ICD-10-CM | POA: Diagnosis not present

## 2020-11-09 DIAGNOSIS — E43 Unspecified severe protein-calorie malnutrition: Secondary | ICD-10-CM | POA: Diagnosis present

## 2020-11-09 DIAGNOSIS — E874 Mixed disorder of acid-base balance: Secondary | ICD-10-CM | POA: Diagnosis present

## 2020-11-09 DIAGNOSIS — F32A Depression, unspecified: Secondary | ICD-10-CM | POA: Diagnosis present

## 2020-11-09 DIAGNOSIS — L309 Dermatitis, unspecified: Secondary | ICD-10-CM | POA: Diagnosis not present

## 2020-11-09 DIAGNOSIS — I9589 Other hypotension: Secondary | ICD-10-CM | POA: Diagnosis present

## 2020-11-09 DIAGNOSIS — T503X5A Adverse effect of electrolytic, caloric and water-balance agents, initial encounter: Secondary | ICD-10-CM | POA: Diagnosis not present

## 2020-11-09 DIAGNOSIS — E8770 Fluid overload, unspecified: Secondary | ICD-10-CM | POA: Diagnosis present

## 2020-11-09 DIAGNOSIS — L299 Pruritus, unspecified: Secondary | ICD-10-CM | POA: Diagnosis not present

## 2020-11-09 DIAGNOSIS — Z7189 Other specified counseling: Secondary | ICD-10-CM | POA: Diagnosis not present

## 2020-11-09 DIAGNOSIS — E875 Hyperkalemia: Secondary | ICD-10-CM | POA: Diagnosis not present

## 2020-11-09 DIAGNOSIS — C8110 Nodular sclerosis classical Hodgkin lymphoma, unspecified site: Secondary | ICD-10-CM | POA: Diagnosis present

## 2020-11-09 DIAGNOSIS — Z6823 Body mass index (BMI) 23.0-23.9, adult: Secondary | ICD-10-CM

## 2020-11-09 DIAGNOSIS — R012 Other cardiac sounds: Secondary | ICD-10-CM | POA: Diagnosis not present

## 2020-11-09 DIAGNOSIS — Z9221 Personal history of antineoplastic chemotherapy: Secondary | ICD-10-CM

## 2020-11-09 DIAGNOSIS — D630 Anemia in neoplastic disease: Secondary | ICD-10-CM | POA: Diagnosis present

## 2020-11-09 DIAGNOSIS — R627 Adult failure to thrive: Secondary | ICD-10-CM | POA: Diagnosis not present

## 2020-11-09 DIAGNOSIS — J9690 Respiratory failure, unspecified, unspecified whether with hypoxia or hypercapnia: Secondary | ICD-10-CM

## 2020-11-09 DIAGNOSIS — K219 Gastro-esophageal reflux disease without esophagitis: Secondary | ICD-10-CM | POA: Diagnosis present

## 2020-11-09 DIAGNOSIS — Z79899 Other long term (current) drug therapy: Secondary | ICD-10-CM

## 2020-11-09 DIAGNOSIS — Z96643 Presence of artificial hip joint, bilateral: Secondary | ICD-10-CM | POA: Diagnosis present

## 2020-11-09 DIAGNOSIS — J9621 Acute and chronic respiratory failure with hypoxia: Secondary | ICD-10-CM | POA: Diagnosis present

## 2020-11-09 DIAGNOSIS — Z9981 Dependence on supplemental oxygen: Secondary | ICD-10-CM

## 2020-11-09 DIAGNOSIS — Z794 Long term (current) use of insulin: Secondary | ICD-10-CM

## 2020-11-09 DIAGNOSIS — I10 Essential (primary) hypertension: Secondary | ICD-10-CM | POA: Diagnosis present

## 2020-11-09 DIAGNOSIS — F419 Anxiety disorder, unspecified: Secondary | ICD-10-CM | POA: Diagnosis present

## 2020-11-09 DIAGNOSIS — Z808 Family history of malignant neoplasm of other organs or systems: Secondary | ICD-10-CM

## 2020-11-09 LAB — COMPREHENSIVE METABOLIC PANEL
ALT: 20 U/L (ref 0–44)
AST: 16 U/L (ref 15–41)
Albumin: 2.1 g/dL — ABNORMAL LOW (ref 3.5–5.0)
Alkaline Phosphatase: 70 U/L (ref 38–126)
Anion gap: 6 (ref 5–15)
BUN: 28 mg/dL — ABNORMAL HIGH (ref 8–23)
CO2: 42 mmol/L — ABNORMAL HIGH (ref 22–32)
Calcium: 11.8 mg/dL — ABNORMAL HIGH (ref 8.9–10.3)
Chloride: 90 mmol/L — ABNORMAL LOW (ref 98–111)
Creatinine, Ser: 0.85 mg/dL (ref 0.61–1.24)
GFR, Estimated: >60 mL/min (ref >60)
Glucose, Bld: 172 mg/dL — ABNORMAL HIGH (ref 70–99)
Potassium: 4.2 mmol/L (ref 3.5–5.1)
Sodium: 138 mmol/L (ref 135–145)
Total Bilirubin: 0.4 mg/dL (ref 0.3–1.2)
Total Protein: 6.1 g/dL — ABNORMAL LOW (ref 6.5–8.1)

## 2020-11-09 LAB — URINALYSIS, COMPLETE (UACMP) WITH MICROSCOPIC
Bilirubin Urine: NEGATIVE
Glucose, UA: NEGATIVE mg/dL
Hgb urine dipstick: NEGATIVE
Ketones, ur: NEGATIVE mg/dL
Nitrite: NEGATIVE
Protein, ur: NEGATIVE mg/dL
Specific Gravity, Urine: 1.013 (ref 1.005–1.030)
pH: 6 (ref 5.0–8.0)

## 2020-11-09 LAB — BLOOD GAS, ARTERIAL
Acid-Base Excess: 16.4 mmol/L — ABNORMAL HIGH (ref 0.0–2.0)
Bicarbonate: 42.4 mmol/L — ABNORMAL HIGH (ref 20.0–28.0)
FIO2: 60
O2 Saturation: 96.1 %
Patient temperature: 35.6
pCO2 arterial: 68.1 mmHg (ref 32.0–48.0)
pH, Arterial: 7.403 (ref 7.350–7.450)
pO2, Arterial: 75 mmHg — ABNORMAL LOW (ref 83.0–108.0)

## 2020-11-09 LAB — CBC WITH DIFFERENTIAL/PLATELET
Abs Immature Granulocytes: 0 10*3/uL (ref 0.00–0.07)
Abs Immature Granulocytes: 0.26 10*3/uL — ABNORMAL HIGH (ref 0.00–0.07)
Basophils Absolute: 0 10*3/uL (ref 0.0–0.1)
Basophils Absolute: 0 10*3/uL (ref 0.0–0.1)
Basophils Relative: 0 %
Basophils Relative: 0 %
Eosinophils Absolute: 0.3 10*3/uL (ref 0.0–0.5)
Eosinophils Absolute: 0 10*3/uL (ref 0.0–0.5)
Eosinophils Relative: 1 %
Eosinophils Relative: 0 %
HCT: 31.8 % — ABNORMAL LOW (ref 39.0–52.0)
HCT: 27.3 % — ABNORMAL LOW (ref 39.0–52.0)
Hemoglobin: 9.4 g/dL — ABNORMAL LOW (ref 13.0–17.0)
Hemoglobin: 8.1 g/dL — ABNORMAL LOW (ref 13.0–17.0)
Immature Granulocytes: 1 %
Lymphocytes Relative: 4 %
Lymphocytes Relative: 3 %
Lymphs Abs: 1.1 10*3/uL (ref 0.7–4.0)
Lymphs Abs: 0.5 10*3/uL — ABNORMAL LOW (ref 0.7–4.0)
MCH: 29.2 pg (ref 26.0–34.0)
MCH: 28.8 pg (ref 26.0–34.0)
MCHC: 29.6 g/dL — ABNORMAL LOW (ref 30.0–36.0)
MCHC: 29.7 g/dL — ABNORMAL LOW (ref 30.0–36.0)
MCV: 98.8 fL (ref 80.0–100.0)
MCV: 97.2 fL (ref 80.0–100.0)
Monocytes Absolute: 0.8 10*3/uL (ref 0.1–1.0)
Monocytes Absolute: 0.7 10*3/uL (ref 0.1–1.0)
Monocytes Relative: 3 %
Monocytes Relative: 3 %
Neutro Abs: 24.5 10*3/uL — ABNORMAL HIGH (ref 1.7–7.7)
Neutro Abs: 19.2 10*3/uL — ABNORMAL HIGH (ref 1.7–7.7)
Neutrophils Relative %: 92 %
Neutrophils Relative %: 93 %
Platelets: 306 10*3/uL (ref 150–400)
Platelets: 235 10*3/uL (ref 150–400)
RBC: 3.22 MIL/uL — ABNORMAL LOW (ref 4.22–5.81)
RBC: 2.81 MIL/uL — ABNORMAL LOW (ref 4.22–5.81)
RDW: 15.1 % (ref 11.5–15.5)
RDW: 15.2 % (ref 11.5–15.5)
WBC: 26.6 10*3/uL — ABNORMAL HIGH (ref 4.0–10.5)
WBC: 20.7 10*3/uL — ABNORMAL HIGH (ref 4.0–10.5)
nRBC: 0 % (ref 0.0–0.2)
nRBC: 0 /100 WBC
nRBC: 0 % (ref 0.0–0.2)

## 2020-11-09 LAB — LACTIC ACID, PLASMA
Lactic Acid, Venous: 4.1 mmol/L (ref 0.5–1.9)
Lactic Acid, Venous: 1.2 mmol/L (ref 0.5–1.9)

## 2020-11-09 LAB — PROTIME-INR
INR: 1.4 — ABNORMAL HIGH (ref 0.8–1.2)
Prothrombin Time: 16.7 seconds — ABNORMAL HIGH (ref 11.4–15.2)

## 2020-11-09 LAB — RESP PANEL BY RT-PCR (FLU A&B, COVID) ARPGX2
Influenza A by PCR: NEGATIVE
Influenza B by PCR: NEGATIVE
SARS Coronavirus 2 by RT PCR: NEGATIVE

## 2020-11-09 LAB — GLUCOSE, CAPILLARY
Glucose-Capillary: 172 mg/dL — ABNORMAL HIGH (ref 70–99)
Glucose-Capillary: 178 mg/dL — ABNORMAL HIGH (ref 70–99)
Glucose-Capillary: 185 mg/dL — ABNORMAL HIGH (ref 70–99)

## 2020-11-09 LAB — MRSA NEXT GEN BY PCR, NASAL: MRSA by PCR Next Gen: NOT DETECTED

## 2020-11-09 LAB — PROCALCITONIN: Procalcitonin: 0.44 ng/mL

## 2020-11-09 LAB — STREP PNEUMONIAE URINARY ANTIGEN: Strep Pneumo Urinary Antigen: NEGATIVE

## 2020-11-09 LAB — CORTISOL: Cortisol, Plasma: 45 ug/dL

## 2020-11-09 MED ORDER — PRO-STAT SUGAR FREE PO LIQD
30.0000 mL | Freq: Every day | ORAL | Status: DC
  Filled 2020-11-09: qty 30

## 2020-11-09 MED ORDER — PROSOURCE TF PO LIQD
45.0000 mL | Freq: Every day | ORAL | Status: DC
  Administered 2020-11-09: 45 mL
  Filled 2020-11-09: qty 45

## 2020-11-09 MED ORDER — POLYETHYLENE GLYCOL 3350 17 G PO PACK: 17.0000 g | PACK | Freq: Every day | ORAL | Status: DC | PRN

## 2020-11-09 MED ORDER — CHLORHEXIDINE GLUCONATE 0.12% ORAL RINSE (MEDLINE KIT)
15.0000 mL | Freq: Two times a day (BID) | OROMUCOSAL | Status: DC
  Administered 2020-11-10 – 2020-11-12 (×4): 15 mL via OROMUCOSAL

## 2020-11-09 MED ORDER — VITAL HIGH PROTEIN PO LIQD: 1000.0000 mL | ORAL | Status: DC

## 2020-11-09 MED ORDER — PIPERACILLIN-TAZOBACTAM 3.375 G IVPB
3.3750 g | Freq: Three times a day (TID) | INTRAVENOUS | Status: DC
  Administered 2020-11-09: 3.375 g via INTRAVENOUS
  Filled 2020-11-09: qty 50

## 2020-11-09 MED ORDER — OSMOLITE 1.2 CAL PO LIQD
1000.0000 mL | ORAL | Status: DC
  Administered 2020-11-09: 1000 mL

## 2020-11-09 MED ORDER — SODIUM CHLORIDE 0.9 % IV SOLN
2.0000 g | Freq: Three times a day (TID) | INTRAVENOUS | Status: DC
  Administered 2020-11-09 – 2020-11-12 (×9): 2 g via INTRAVENOUS
  Filled 2020-11-09 (×9): qty 2

## 2020-11-09 MED ORDER — IPRATROPIUM-ALBUTEROL 0.5-2.5 (3) MG/3ML IN SOLN
3.0000 mL | Freq: Four times a day (QID) | RESPIRATORY_TRACT | Status: DC | PRN
  Administered 2020-11-11: 3 mL via RESPIRATORY_TRACT
  Filled 2020-11-09 (×2): qty 3

## 2020-11-09 MED ORDER — ACETAMINOPHEN 160 MG/5ML PO SOLN
650.0000 mg | Freq: Four times a day (QID) | ORAL | Status: DC | PRN
  Filled 2020-11-09: qty 20.3

## 2020-11-09 MED ORDER — SERTRALINE HCL 100 MG PO TABS
100.0000 mg | ORAL_TABLET | Freq: Every day | ORAL | Status: DC
  Administered 2020-11-09 – 2020-11-18 (×10): 100 mg
  Filled 2020-11-09 (×10): qty 1

## 2020-11-09 MED ORDER — LACTATED RINGERS IV BOLUS
500.0000 mL | Freq: Once | INTRAVENOUS | Status: AC
  Administered 2020-11-09: 500 mL via INTRAVENOUS

## 2020-11-09 MED ORDER — NOREPINEPHRINE 4 MG/250ML-% IV SOLN
0.0000 ug/min | INTRAVENOUS | Status: DC
Start: 2020-11-09 — End: 2020-11-11
  Administered 2020-11-09: 2 ug/min via INTRAVENOUS
  Filled 2020-11-09: qty 250

## 2020-11-09 MED ORDER — DOCUSATE SODIUM 100 MG PO CAPS: 100.0000 mg | ORAL_CAPSULE | Freq: Two times a day (BID) | ORAL | Status: DC | PRN

## 2020-11-09 MED ORDER — ADULT MULTIVITAMIN LIQUID CH
15.0000 mL | Freq: Every day | ORAL | Status: DC
  Administered 2020-11-09 – 2020-11-18 (×10): 15 mL
  Filled 2020-11-09 (×11): qty 15

## 2020-11-09 MED ORDER — OSMOLITE 1.2 CAL PO LIQD: 1000.0000 mL | ORAL | Status: DC

## 2020-11-09 MED ORDER — CHLORHEXIDINE GLUCONATE CLOTH 2 % EX PADS
6.0000 | MEDICATED_PAD | Freq: Every day | CUTANEOUS | Status: DC
  Administered 2020-11-09 – 2020-11-16 (×5): 6 via TOPICAL

## 2020-11-09 MED ORDER — MIDODRINE HCL 5 MG PO TABS
10.0000 mg | ORAL_TABLET | Freq: Once | ORAL | Status: AC
  Administered 2020-11-09: 10 mg via ORAL
  Filled 2020-11-09: qty 2

## 2020-11-09 MED ORDER — GUAIFENESIN 100 MG/5ML PO SOLN
5.0000 mL | ORAL | Status: DC | PRN
  Administered 2020-11-11 – 2020-11-17 (×16): 100 mg
  Filled 2020-11-09 (×16): qty 10

## 2020-11-09 MED ORDER — HYOSCYAMINE SULFATE 0.125 MG/ML PO SOLN: 0.1250 mg | ORAL | Status: DC | PRN

## 2020-11-09 MED ORDER — MIDODRINE HCL 5 MG PO TABS
10.0000 mg | ORAL_TABLET | Freq: Three times a day (TID) | ORAL | Status: DC
  Administered 2020-11-09 – 2020-11-18 (×26): 10 mg
  Filled 2020-11-09 (×26): qty 2

## 2020-11-09 MED ORDER — VANCOMYCIN HCL 750 MG/150ML IV SOLN
750.0000 mg | Freq: Two times a day (BID) | INTRAVENOUS | Status: DC
  Administered 2020-11-09: 750 mg via INTRAVENOUS
  Filled 2020-11-09 (×2): qty 150

## 2020-11-09 MED ORDER — VITAMIN D3 250 MCG (10000 UT) PO CAPS: 10000.0000 [IU] | ORAL_CAPSULE | Freq: Every day | ORAL | Status: DC

## 2020-11-09 MED ORDER — PANTOPRAZOLE SODIUM 40 MG IV SOLR
40.0000 mg | Freq: Every day | INTRAVENOUS | Status: DC
  Administered 2020-11-09 – 2020-11-13 (×5): 40 mg via INTRAVENOUS
  Filled 2020-11-09 (×5): qty 40

## 2020-11-09 MED ORDER — VANCOMYCIN HCL IN DEXTROSE 750-5 MG/150ML-% IV SOLN
750.0000 mg | Freq: Two times a day (BID) | INTRAVENOUS | Status: DC
  Filled 2020-11-09: qty 150

## 2020-11-09 MED ORDER — TRIAMCINOLONE ACETONIDE 0.1 % EX CREA
1.0000 "application " | TOPICAL_CREAM | Freq: Two times a day (BID) | CUTANEOUS | Status: DC | PRN
  Administered 2020-11-10: 1 via TOPICAL
  Filled 2020-11-09: qty 15

## 2020-11-09 MED ORDER — INSULIN ASPART 100 UNIT/ML IJ SOLN
0.0000 [IU] | INTRAMUSCULAR | Status: DC
  Administered 2020-11-09 (×2): 3 [IU] via SUBCUTANEOUS
  Administered 2020-11-10: 2 [IU] via SUBCUTANEOUS
  Administered 2020-11-10 (×4): 3 [IU] via SUBCUTANEOUS
  Administered 2020-11-10: 2 [IU] via SUBCUTANEOUS
  Administered 2020-11-10 – 2020-11-11 (×2): 5 [IU] via SUBCUTANEOUS
  Administered 2020-11-11: 3 [IU] via SUBCUTANEOUS
  Administered 2020-11-11: 5 [IU] via SUBCUTANEOUS
  Administered 2020-11-11 – 2020-11-12 (×3): 3 [IU] via SUBCUTANEOUS
  Administered 2020-11-12 (×3): 2 [IU] via SUBCUTANEOUS
  Administered 2020-11-12: 3 [IU] via SUBCUTANEOUS
  Administered 2020-11-12: 2 [IU] via SUBCUTANEOUS
  Administered 2020-11-13: 5 [IU] via SUBCUTANEOUS
  Administered 2020-11-13 (×2): 2 [IU] via SUBCUTANEOUS
  Administered 2020-11-13: 3 [IU] via SUBCUTANEOUS
  Administered 2020-11-13: 2 [IU] via SUBCUTANEOUS
  Administered 2020-11-13: 3 [IU] via SUBCUTANEOUS
  Administered 2020-11-14 (×2): 2 [IU] via SUBCUTANEOUS
  Administered 2020-11-14 – 2020-11-15 (×5): 3 [IU] via SUBCUTANEOUS
  Administered 2020-11-15 (×3): 2 [IU] via SUBCUTANEOUS
  Administered 2020-11-15 – 2020-11-16 (×2): 5 [IU] via SUBCUTANEOUS
  Administered 2020-11-16: 3 [IU] via SUBCUTANEOUS
  Administered 2020-11-16 (×2): 5 [IU] via SUBCUTANEOUS
  Administered 2020-11-16: 8 [IU] via SUBCUTANEOUS
  Administered 2020-11-16: 3 [IU] via SUBCUTANEOUS
  Administered 2020-11-16: 8 [IU] via SUBCUTANEOUS
  Administered 2020-11-17: 5 [IU] via SUBCUTANEOUS
  Administered 2020-11-17: 3 [IU] via SUBCUTANEOUS

## 2020-11-09 MED ORDER — CHLORHEXIDINE GLUCONATE 0.12 % MT SOLN
OROMUCOSAL | Status: AC
  Administered 2020-11-09: 15 mL via OROMUCOSAL
  Filled 2020-11-09: qty 15

## 2020-11-09 MED ORDER — VANCOMYCIN HCL IN DEXTROSE 1-5 GM/200ML-% IV SOLN
1000.0000 mg | Freq: Once | INTRAVENOUS | Status: DC
  Administered 2020-11-09: 1000 mg via INTRAVENOUS
  Filled 2020-11-09 (×2): qty 200

## 2020-11-09 MED ORDER — LACTATED RINGERS IV BOLUS
1000.0000 mL | Freq: Once | INTRAVENOUS | Status: AC
  Administered 2020-11-09: 1000 mL via INTRAVENOUS

## 2020-11-09 MED ORDER — LOPERAMIDE HCL 1 MG/7.5ML PO SUSP: 2.0000 mg | ORAL | Status: DC | PRN

## 2020-11-09 MED ORDER — CALAMINE EX LOTN
TOPICAL_LOTION | CUTANEOUS | Status: DC | PRN
  Administered 2020-11-10: 1 via TOPICAL
  Filled 2020-11-09: qty 177

## 2020-11-09 MED ORDER — HEPARIN SODIUM (PORCINE) 5000 UNIT/ML IJ SOLN
5000.0000 [IU] | Freq: Three times a day (TID) | INTRAMUSCULAR | Status: DC
  Administered 2020-11-09 – 2020-11-18 (×27): 5000 [IU] via SUBCUTANEOUS
  Filled 2020-11-09 (×26): qty 1

## 2020-11-09 MED ORDER — VITAMIN D 25 MCG (1000 UNIT) PO TABS
1000.0000 [IU] | ORAL_TABLET | Freq: Every day | ORAL | Status: DC
  Administered 2020-11-09 – 2020-11-18 (×10): 1000 [IU] via ORAL
  Filled 2020-11-09 (×10): qty 1

## 2020-11-09 NOTE — ED Provider Notes (Signed)
[facility] EMERGENCY DEPARTMENT Provider Note   CSN: [id number] Arrival date & time: 12/09/2020  1100     History CC - Hypoxia  Steve [name] is a 65 y.o. male.  HPI     65 year old male with a past medical history of ILD, pulmonary fibrosis, chronic take tracheostomy due to prolonged hospitalization 2021 from COVID-pneumonia, type 2 diabetes, hypertension, relapsed Hodgkin's lymphoma presenting to the emergency department with increasing vent requirements.  Per EMS, the patient is trach dependent but is typically only on the vent as needed.  They state that per the patient's wife, he has required the ventilator 100% of the time for the past week which is very atypical for the patient.  Wife states that the patient's oxygen saturations were 50% today and he seemed to be working harder to breathe without the ventilator, so she applied the ventilator and called 911.  She denies any known fever to EMS.  EMS report the patient recently completed 6 weeks of vancomycin through a right arm PICC line.  Past Medical History:  Diagnosis Date   Abscess of muscle 08/10/2011   staph infection of right hip    Acute on chronic respiratory failure with hypoxia ([name])    Acute respiratory distress syndrome (ARDS) due to COVID-19 virus ([name])    Anxiety    Diabetes mellitus without complication ([name])    GERD (gastroesophageal reflux disease)    rare reflux - no meds for reflux - NO PROBLEM IN PAST SEVERAL YRS   HCAP (healthcare-associated pneumonia)    Hip dysplasia, congenital    no surgery as a child for hip dysplasia - has had bilateral hip replacements as an adult   Hodgkin lymphoma ([place])    Hypertension    Pancreatitis    Pneumothorax, acute    Postoperative anemia due to acute blood loss 09/07/2012   Septic arthritis of hip ([place]) 09/05/2012   PT'S TOTAL HIP JOINT REMOVED - ANTIBIOTIC SPACE PLACED AND PT HAS FINISHED IV ANTIBIOTICS ( PICC LINE REMOVED)   Sleep apnea    USES  CPAP    Patient Active Problem List   Diagnosis Date Noted   Respiratory distress    Bacteremia 09/23/2020   Failure to thrive in adult 09/19/2020   Sepsis due to pneumonia ([place]) 08/13/2020   Acute on chronic respiratory failure with hypoxia and hypercapnia ([place]) 07/21/2020   DM (diabetes mellitus), type 2 ([place]) 07/15/2020   Pneumonia 07/14/2020   Pulmonary fibrosis ([place]) 05/26/2020   Bronchiectasis ([place]) 05/26/2020   Acute hypercapnic respiratory failure ([place]) 05/26/2020   Acute on chronic respiratory failure with hypoxia ([place]) 05/25/2020   Uncontrolled type 2 diabetes mellitus with hyperglycemia, with long-term current use of insulin ([place]) 05/25/2020   Essential hypertension 05/25/2020   GERD without esophagitis 05/25/2020   Debility    Anxiety    Tracheostomy care ([place])    Pneumothorax on right 03/23/2020   Chronic respiratory failure with hypoxia ([name])    ARDS (adult respiratory distress syndrome) ([place])    Hodgkin's lymphoma ([place])    Pneumothorax, acute    Healthcare-associated pneumonia    COVID-19 virus infection    On mechanically assisted ventilation ([place])    Dysphagia    Pneumothorax    Subcutaneous air ([place])    Tracheostomy dependence ([place])    Pneumomediastinum ([place])    Acute respiratory distress syndrome (ARDS) due to COVID-19 virus ([place])    Pressure ulcer 07/27/2019   Acute respiratory failure with hypoxemia ([place])  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[place])    Malnutrition of moderate degree 07/10/2019   HCAP (healthcare-associated pneumonia)    Hypoxia    Febrile neutropenia ([place]) 05/17/2019   COVID-19 05/17/2019   Hyponatremia 05/17/2019   Protein-calorie malnutrition, severe ([place]) 05/17/2019   Neutropenic fever ([place]) 05/16/2019   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   Chemotherapy-induced diarrhea 04/22/2019   Anemia due to antineoplastic chemotherapy 03/25/2019   Hodgkin's lymphoma ([place]) 02/25/2019   Abnormal CT scan,  pancreas - tail area 01/31/2013   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   Septic arthritis of hip ([place]) 09/05/2012   Routine general medical examination at a health care facility 09/12/2011   Elbow pain 09/12/2011   Abscess of muscle 08/10/2011   H/O dental abscess 08/10/2011   Staphylococcus aureus bacteremia 07/27/2011   Abdominal pain, RLQ 09/02/2010   HYPOGONADISM, MALE 03/23/2007   SLEEP APNEA 03/23/2007    Past Surgical History:  Procedure Laterality Date   COLONOSCOPY  04/26/2007   HERNIA REPAIR     inguinal hernia x3   IR GASTROSTOMY TUBE MOD SED  08/22/2019   IR IMAGING GUIDED PORT INSERTION  03/29/2019   IR REMOVAL TUN ACCESS W/ PORT W/O FL MOD SED  09/22/2020   JOINT REPLACEMENT  2002 & 2007   bilateral hip replacement   LYMPH NODE BIOPSY Right 03/20/2019   Procedure: DEEP RIGHT SUPRACLAVICULAR LYMPH NODE EXCISION;  Surgeon: Fanny Skates, MD;  Location: [place];  Service: General;  Laterality: Right;   MULTIPLE EXTRACTIONS WITH ALVEOLOPLASTY  07/28/2011   Procedure: MULTIPLE EXTRACION WITH ALVEOLOPLASTY;  Surgeon: Lenn Cal, DDS;  Location: [facility];  Service: Oral Surgery;  Laterality: N/A;  Extraction of tooth #'s 2,3,4,5,6,11,12,13,15,19,22 with alveoloplasty.   shoulder repair - right for separation of shoulder     TEE WITHOUT CARDIOVERSION  07/29/2011   Procedure: TRANSESOPHAGEAL ECHOCARDIOGRAM (TEE);  Surgeon: Josue Hector, MD;  Location: [facility];  Service: Cardiovascular;  Laterality: N/A;   TOTAL HIP REVISION Right 09/05/2012   Procedure: RIGHT HIP RESECTION ARTHROPLASTY WITH ANTIBIOTIC SPACERS;  Surgeon: [name], MD;  Location: [facility];  Service: Orthopedics;  Laterality: Right;   TOTAL HIP REVISION Right 11/30/2012   Procedure: RIGHT TOTAL HIP ARTHROPLASTY REIMPLANTATION;  Surgeon: [name], MD;  Location: [facility];  Service: Orthopedics;  Laterality: Right;       Family History  Problem Relation Age of Onset    Melanoma Mother    Heart attack Mother    Hyperlipidemia Neg Hx    Sudden death Neg Hx    Hypertension Neg Hx    Diabetes Neg Hx     Social History   Tobacco Use   Smoking status: Never   Smokeless tobacco: Never  Vaping Use   Vaping Use: Never used  Substance Use Topics   Alcohol use: Yes    Comment: rare beer. 5 times a year at most. 2 beers when he drinks.   Drug use: No    Home Medications Prior to Admission medications   Medication Sig Start Date End Date Taking? Authorizing Provider  albuterol (PROVENTIL) (2.5 MG/3ML) 0.083% nebulizer solution Take 2.5 mg by nebulization every 2 (two) hours as needed for wheezing or shortness of breath.   Yes [provider]  calamine lotion Apply topically as needed for itching. 07/18/20  Yes Pokhrel, Laxman, MD  furosemide (LASIX) 20 MG tablet Take 20 mg by mouth daily.   Yes [provider]  guaiFENesin (ROBITUSSIN) 100 MG/5ML SOLN Place 5 mLs (100 mg total) into feeding tube every 4 (four) hours as needed for cough or to loosen phlegm. Patient taking differently: Take 5 mLs by mouth every 4 (four) hours as needed for cough or to loosen phlegm. 07/18/20  Yes Pokhrel, Laxman, MD  LORazepam (ATIVAN) 0.5 MG tablet Take 1 tablet (0.5 mg total) by mouth every 6 (six) hours as needed (Nausea or vomiting). 10/26/20  Yes Georgette Shell, MD  midodrine (PROAMATINE) 10 MG tablet Take 1 tablet (10 mg total) by mouth 3 (three) times daily with meals. 10/26/20  Yes Georgette Shell, MD  Multiple Vitamin (MULTIVITAMIN) LIQD Place 15 mLs into feeding tube daily. 08/28/19  Yes Ollis, Cleaster Corin, NP  Nutritional Supplements (NUTRITIONAL SHAKE HIGH PROTEIN) LIQD Place 62 fluid ounces into feeding tube daily.   Yes [provider]  ondansetron (ZOFRAN) 4 MG tablet Take 1 tablet (4 mg total) by mouth every 6 (six) hours as needed for nausea. 07/18/20  Yes Pokhrel, Laxman, MD  OXYGEN Inhale 16 L/min into the lungs See admin  instructions. 16 L/min, via vent, continuously   Yes [provider]  pantoprazole (PROTONIX) 40 MG tablet Take 40 mg by mouth daily.   Yes [provider]  sertraline (ZOLOFT) 100 MG tablet Take 1 tablet (100 mg total) by mouth daily. 10/26/20  Yes Georgette Shell, MD  traZODone (DESYREL) 100 MG tablet PLACE 1 TABLET (100 MG TOTAL) INTO FEEDING TUBE AT BEDTIME. Patient taking differently: Take 100 mg by mouth at bedtime. 08/03/20  Yes Saguier, Percell Miller, PA-C  triamcinolone (KENALOG) 0.1 % Apply 1 application topically 2 (two) times daily as needed (itching).   Yes [provider]  Amino Acids-Protein Hydrolys (FEEDING SUPPLEMENT, PRO-STAT SUGAR FREE 64,) LIQD Place 30 mLs into feeding tube daily. Patient not taking: Reported on 12/02/2020    [provider]  chlorhexidine (PERIDEX) 0.12 % solution 15 mLs by Mouth Rinse route 2 (two) times daily. Patient not taking: Reported on 12/09/2020 04/17/20   Florencia Reasons, MD  Cholecalciferol (VITAMIN D3) 250 MCG (10000 UT) capsule Take 10,000 Units by mouth daily.    [provider]  HYOSYNE 0.125 MG/ML solution Take 0.125 mg by mouth every 4 (four) hours as needed for cramping. Patient not taking: Reported on 12/01/2020 07/20/20   [provider]  insulin glargine (LANTUS) 100 UNIT/ML injection Inject 0.1 mLs (10 Units total) into the skin at bedtime. Patient not taking: Reported on 11/20/2020 10/26/20   Georgette Shell, MD  lidocaine-prilocaine (EMLA) cream Apply to affected area once Patient not taking: Reported on 11/20/2020 07/09/20   Volanda Napoleon, MD  loperamide HCl (IMODIUM) 1 MG/7.5ML suspension Place 15 mLs (2 mg total) into feeding tube as needed for diarrhea or loose stools (maximum 45m per day). Patient not taking: Reported on 11/25/2020 07/20/20   Pokhrel, LCorrie Mckusick MD  Nutritional Supplements (FEEDING SUPPLEMENT, OSMOLITE 1.2 CAL,) LIQD Place 1,000 mLs into feeding tube continuous. Patient not taking:  Reported on 11/23/2020 04/17/20   XFlorencia Reasons MD  vancomycin IVPB Inject 1,000 mg into the vein daily for 19 days. Indication:  MRSE Bacteremia  First Dose: No Last Day of Therapy:  11/03/2020 Labs - Sunday/Monday:  CBC/D, BMP, and vancomycin trough. Labs - Thursday:  BMP and vancomycin trough Labs - Every other week:  ESR and CRP Method of administration:Elastomeric Method of administration may be changed at the discretion of the patient and/or caregiver's ability to self-administer the  medication ordered. Patient not taking: No sig reported 10/26/20 11/14/20  Georgette Shell, MD    Allergies    Patient has no known allergies.  Review of Systems   Review of Systems  Reason unable to perform ROS: Patient is nonverbal.   Physical Exam Updated Vital Signs BP (!) 79/47   Pulse 68   Temp (!) 96.9 F (36.1 C) (Axillary)   Resp (!) 23   Ht 5' 5"  (1.651 m)   Wt 63.9 kg   SpO2 96%   BMI 23.44 kg/m   Physical Exam Vitals and nursing note reviewed.  Constitutional:      General: He is in acute distress.     Appearance: He is ill-appearing. He is not toxic-appearing.     Comments: Cachectic appearing  HENT:     Head: Normocephalic and atraumatic.  Eyes:     Conjunctiva/sclera: Conjunctivae normal.  Cardiovascular:     Rate and Rhythm: Regular rhythm. Tachycardia present.  Pulmonary:     Effort: Tachypnea and accessory muscle usage present.     Breath sounds: Examination of the right-middle field reveals decreased breath sounds and rhonchi. Examination of the left-middle field reveals decreased breath sounds and rhonchi. Examination of the right-lower field reveals decreased breath sounds and rhonchi. Examination of the left-lower field reveals decreased breath sounds and rhonchi. Decreased breath sounds and rhonchi present. No wheezing.     Comments: Arrives on pressure control, pulling tidal volumes of around 200, tachypneic Abdominal:     Palpations: Abdomen is soft.      Tenderness: There is no abdominal tenderness.  Skin:    General: Skin is warm and dry.  Neurological:     Mental Status: He is alert.   ED Results / Procedures / Treatments   Labs (all labs ordered are listed, but only abnormal results are displayed) Labs Reviewed  CBC WITH DIFFERENTIAL/PLATELET - Abnormal; Notable for the following components:      Result Value   WBC 20.7 (*)    RBC 2.81 (*)    Hemoglobin 8.1 (*)    HCT 27.3 (*)    MCHC 29.7 (*)    Neutro Abs 19.2 (*)    Lymphs Abs 0.5 (*)    Abs Immature Granulocytes 0.26 (*)    All other components within normal limits  LACTIC ACID, PLASMA - Abnormal; Notable for the following components:   Lactic Acid, Venous 4.1 (*)    All other components within normal limits  COMPREHENSIVE METABOLIC PANEL - Abnormal; Notable for the following components:   Chloride 90 (*)    CO2 42 (*)    Glucose, Bld 172 (*)    BUN 28 (*)    Calcium 11.8 (*)    Total Protein 6.1 (*)    Albumin 2.1 (*)    All other components within normal limits  PROTIME-INR - Abnormal; Notable for the following components:   Prothrombin Time 16.7 (*)    INR 1.4 (*)    All other components within normal limits  CBC WITH DIFFERENTIAL/PLATELET - Abnormal; Notable for the following components:   WBC 26.6 (*)    RBC 3.22 (*)    Hemoglobin 9.4 (*)    HCT 31.8 (*)    MCHC 29.6 (*)    Neutro Abs 24.5 (*)    All other components within normal limits  URINALYSIS, COMPLETE (UACMP) WITH MICROSCOPIC - Abnormal; Notable for the following components:   APPearance HAZY (*)    Leukocytes,Ua TRACE (*)  Bacteria, UA RARE (*)    All other components within normal limits  BLOOD GAS, ARTERIAL - Abnormal; Notable for the following components:   pCO2 arterial 68.1 (*)    pO2, Arterial 75.0 (*)    Bicarbonate 42.4 (*)    Acid-Base Excess 16.4 (*)    All other components within normal limits  GLUCOSE, CAPILLARY - Abnormal; Notable for the following components:    Glucose-Capillary 172 (*)    All other components within normal limits  GLUCOSE, CAPILLARY - Abnormal; Notable for the following components:   Glucose-Capillary 178 (*)    All other components within normal limits  RESP PANEL BY RT-PCR (FLU A&B, COVID) ARPGX2  MRSA NEXT GEN BY PCR, NASAL  CULTURE, BLOOD (ROUTINE X 2)  CULTURE, BLOOD (ROUTINE X 2)  CULTURE, RESPIRATORY W GRAM STAIN  LACTIC ACID, PLASMA  STREP PNEUMONIAE URINARY ANTIGEN  PROCALCITONIN  CORTISOL  CBC  BASIC METABOLIC PANEL  MAGNESIUM  PHOSPHORUS  PROCALCITONIN  LEGIONELLA PNEUMOPHILA SEROGP 1 UR AG  I-STAT ARTERIAL BLOOD GAS, ED    EKG None  Radiology DG Abd 1 View  Result Date: 12/08/2020 CLINICAL DATA:  Ileus EXAM: ABDOMEN - 1 VIEW COMPARISON:  09/24/2019 FINDINGS: Two supine frontal views of the abdomen and pelvis are obtained. Percutaneous gastrostomy tube overlies left upper quadrant. No bowel obstruction or ileus. Gas is seen throughout the small and large bowel to the level of the rectum. No masses or abnormal calcifications. Bibasilar airspace disease and right pleural effusion again noted. IMPRESSION: 1. Unremarkable bowel gas pattern. 2. Stable bilateral airspace disease and right pleural effusion. Electronically Signed   By: Randa Ngo M.D.   On: 11/21/2020 15:06   DG Chest Portable 1 View  Result Date: 12/06/2020 CLINICAL DATA:  Shortness of breath EXAM: PORTABLE CHEST 1 VIEW COMPARISON:  09/24/2020 FINDINGS: Severe diffuse bilateral airspace disease. Moderate right pleural effusion peripherally around the right lung. There is cardiomegaly. Tracheostomy is un changed. Right PICC line in place with the tip at the cavoatrial junction. IMPRESSION: Severe diffuse bilateral airspace disease, not significantly changed since prior study. Moderate right pleural effusion. Electronically Signed   By: Rolm Baptise M.D.   On: 12/01/2020 11:49    Procedures Procedures   Medications Ordered in ED Medications   docusate sodium (COLACE) capsule 100 mg (has no administration in time range)  polyethylene glycol (MIRALAX / GLYCOLAX) packet 17 g (has no administration in time range)  heparin injection 5,000 Units (5,000 Units Subcutaneous Given 11/16/2020 2147)  pantoprazole (PROTONIX) injection 40 mg (40 mg Intravenous Given 11/16/2020 2147)  ipratropium-albuterol (DUONEB) 0.5-2.5 (3) MG/3ML nebulizer solution 3 mL (has no administration in time range)  calamine lotion (has no administration in time range)  chlorhexidine gluconate (MEDLINE KIT) (PERIDEX) 0.12 % solution 15 mL (15 mLs Mouth Rinse Given 12/02/2020 2147)  guaiFENesin (ROBITUSSIN) 100 MG/5ML solution 100 mg (has no administration in time range)  hyoscyamine (LEVSIN) 0.125 MG/ML solution 0.125 mg (has no administration in time range)  loperamide HCl (IMODIUM) 1 MG/7.5ML suspension 2 mg (has no administration in time range)  midodrine (PROAMATINE) tablet 10 mg (10 mg Per Tube Given 11/21/2020 1742)  multivitamin liquid 15 mL (15 mLs Per Tube Given 12/05/2020 1742)  sertraline (ZOLOFT) tablet 100 mg (100 mg Per Tube Given 11/23/2020 1742)  triamcinolone cream (KENALOG) 0.1 % cream 1 application (has no administration in time range)  insulin aspart (novoLOG) injection 0-15 Units (3 Units Subcutaneous Given 11/21/2020 1945)  cholecalciferol (VITAMIN D3) tablet 1,000  Units (1,000 Units Oral Given 11/15/2020 1744)  ceFEPIme (MAXIPIME) 2 g in sodium chloride 0.9 % 100 mL IVPB (0 g Intravenous Stopped 11/23/2020 2025)  Chlorhexidine Gluconate Cloth 2 % PADS 6 each (6 each Topical Given 12/05/2020 1637)  acetaminophen (TYLENOL) 160 MG/5ML solution 650 mg (has no administration in time range)  vancomycin (VANCOREADY) IVPB 750 mg/150 mL (has no administration in time range)  feeding supplement (VITAL HIGH PROTEIN) liquid 1,000 mL (has no administration in time range)  feeding supplement (OSMOLITE 1.2 CAL) liquid 1,000 mL (1,000 mLs Per Tube New Bag/Given 12/04/2020 1809)  norepinephrine  (LEVOPHED) 82m in 2569mpremix infusion (2 mcg/min Intravenous New Bag/Given 11/28/2020 2146)  lactated ringers bolus 1,000 mL (0 mLs Intravenous Stopped 11/13/2020 1250)  midodrine (PROAMATINE) tablet 10 mg (10 mg Oral Given 11/19/2020 1308)  lactated ringers bolus 1,000 mL (0 mLs Intravenous Stopped 11/25/2020 1438)  lactated ringers bolus 500 mL (0 mLs Intravenous Stopped 12/07/2020 1954)    ED Course  I have reviewed the triage vital signs and the nursing notes.  Pertinent labs & imaging results that were available during my care of the patient were reviewed by me and considered in my medical decision making (see chart for details).    MDM Rules/Calculators/A&P                           89o M with tracheostomy due to COVID pneumonia with recent admission for staph sepsis presenting to the ED for increasing ventilator support at home. EMS report that patient recently finished antibiotics via PICC line several days ago. Typically uses vent as needed, but has needed vent 100% of the time for the last several days. Vital signs reviewed on arrival, tachycardia, tachypneic on pressure support mode, MAP around 67. Will obtain CBC, lactic, CMP, CXR. COVID test. Will change over to volume support as the patient has significantly increased work of breathing on home vent settings. On 100% FiO2. Will give IVF and broad spectrum antibiotics.   CXR shows similar severe bilateral airspace disease. WBC of 26. Lactic of 4.1, will give additional fluid resuscitation. CMP appears largely stable. Will consult critical care due to vent requirements and systemic infection with likely pulmonary source.   They have evaluated the patient at bedside, patient will be admitted to the ICU.   Final Clinical Impression(s) / ED Diagnoses Final diagnoses:  Pneumonia  [facility]   Rx / DC Orders ED Discharge Orders     None        PoClaud KelpMD 12/09/2020 2159    GoSherwood GamblerMD 11/10/20 [phone]

## 2020-11-09 NOTE — Progress Notes (Addendum)
Rt helped MD with bedside bronch and changing trach out from a #6 to and #[id number] [place]. No complication at this time. Wife at bedside.

## 2020-11-09 NOTE — Progress Notes (Signed)
Pharmacy Antibiotic Note  Steve [name] is a 65 y.o. male admitted on 11/12/2020 with pneumonia.  Pharmacy has been consulted for vancomycin and cefepime dosing.  Plan: Vanc 1000mg  x1 then 750mg  IV q12h (eAUC 486, Cr 0.85mg /dL Cefepime 2g IV q8h -Monitor renal function, clinical status, and antibiotic plan  Height: 5\' 5"  (165.1 cm) Weight: 63.9 kg (140 lb 14 oz) IBW/kg (Calculated) : 61.5  Temp (24hrs), Avg:97.1 F (36.2 C), Min:96.1 F (35.6 C), Max:98.1 F (36.7 C)  Recent Labs  Lab 11/30/2020 1130 12/02/2020 1210  WBC 26.6*  --   CREATININE  --  0.85  LATICACIDVEN  --  4.1*    Estimated Creatinine Clearance: 75.4 mL/min (by C-G formula based on SCr of 0.85 mg/dL).    No Known Allergies  Antimicrobials this admission: Vanc 8/1 >>  Cefepime 8/1 >>  Zosyn x1  Dose adjustments this admission: N/A  Microbiology results: 8/1 BCx:   Thank you for allowing pharmacy to be a part of this patient's care.  Joetta Manners, PharmD, [facility] Emergency Medicine Clinical Pharmacist ED RPh Phone: [place]: [phone]

## 2020-11-09 NOTE — Progress Notes (Signed)
eLink Physician-Brief Progress Note Patient Name: Steve [name] DOB: 01-07-1956 MRN: [id number]   Date of Service  11/20/2020  HPI/Events of Note  Multiple issues: 1. ABG from 2:54 PM on 70%/PRVC 23/TV 490/P 5 =  7.40/68.1/75.0/42.4 The pH is normal is spite of elevated pCO2 c/w chronic pCO2 retention 2. Hypotension - BP = 79/47 with MAP = 57. Hgb = 8.1.   eICU Interventions  Plan: Monitor CVP now and Q 4 hours. Norepinephrine IV infusion. Titrate to MAP >= 65.     Intervention Category Major Interventions: Respiratory failure - evaluation and management;Hypotension - evaluation and management  [name] 12/06/2020, 8:54 PM

## 2020-11-09 NOTE — H&P (Addendum)
NAME:  Steve [name], MRN:  [id number], DOB:  1955-05-21, LOS: 0 ADMISSION DATE:  11/21/2020, CONSULTATION DATE:  8/1 REFERRING MD:  Dr. Colin Mulders, CHIEF COMPLAINT:  Increased vent use   History of Present Illness:  65 y/o M who presented to [facility] ER on 8/1 with reports of increased vent use over the preceding 4 days and desaturations.    The patient is followed by Dr. Marin Olp for Hodgin's Lymphoma and was deemed not a candidate for further treatment due to his functional status. He was last reviewed on 11/03/20 with plan for follow up in 4 weeks to reassess functional status. He has had a prolonged chronic critical illness since March of 2021 - including lymphoma, COVID, ARDS thought multifactorial in the setting of COVID and possible pneumonitis from immunotherapy, chronic trach/vent, multiple chest tubes, malnutrition, bacteremia, frequent readmissions and failure to thrive.   He was recently admitted from 6/11 - 7/18 for poor appetite and malaise.  Work up at that time found him to be positive for staph epidermidis bacteremia.  He was seen by ID with a discharge plan of 6 weeks of IV antibiotics (first negative blood culture 09/22/20).    His wife reports the patient has been requiring the home vent consistently over the last four days.  He previously had been on the ATC during the day times.  He has home health that come in to assist in his care 2x per week.  His wife and the home care RN noted he used the urinal on 8/1 and desaturated into 80's. They re-checked the sats via home health RN's meter and they were in the 50's.  EMS was activated. On ER arrival, he was noted to be somnolent on the vent and lethargic.  ABG evaluation pending. Most recent ABG in July 7.318 / 88.   CXR review showed known severe diffuse bilateral airspace disease, moderate right pleural effusion. Wife reports he has not had his midodrine until he got to the ER.  Initial labs notable for CO2 of 42, glucose 172, BUN 28 / Sr Cr  0.85 (up from 0.5), albumin 2.1, lactic acid 4.1, WBC 26.6, Hgb 9.4, and platelets 306.    PCCM consulted for evaluation of hypoxic respiratory failure.   Pertinent  Medical History  Hodgkin's Lymphoma COVID with ARDS - 2021 s/p vent trach  Hx Pneumothorax, Loculated Effusion GERD HTN  Pancreatitis   Significant Hospital Events: Including procedures, antibiotic start and stop dates in addition to other pertinent events   8/1 Admit with AMS  Interim History / Subjective:  As above  Temp 96.1   Objective   Blood pressure (!) 84/61, pulse (!) 113, temperature (!) 96.1 F (35.6 C), temperature source Axillary, resp. rate 14, height 5' 5"  (1.651 m), weight 63.9 kg, SpO2 100 %.    Vent Mode: PRVC FiO2 (%):  [100 %] 100 % Set Rate:  [14 bmp] 14 bmp PEEP:  [5 cmH20] 5 cmH20   Intake/Output Summary (Last 24 hours) at 12/05/2020 1352 Last data filed at 12/04/2020 1250 Gross per 24 hour  Intake 1000 ml  Output --  Net 1000 ml   Filed Weights   12/02/2020 1100 12/06/2020 1122  Weight: 63.9 kg 63.9 kg    Examination: General: chronically ill appearing adult male lying in bed in NAD on vent HEENT: MM pink/moist, trach midline with large stoma, pooled secretions in stoma, anicteric, mild erythematous dry patches on face / rosacea  Neuro: lethargic, minimal arousal with sternal rub  CV: s1s2 RRR, no m/r/g PULM: non-labored on vent, lungs bilaterally with rhonchi GI: soft, bsx4 active, PEG  Extremities: warm/dry, trace generalized edema  Skin: no rashes or lesions  Resolved Hospital Problem list     Assessment & Plan:   Acute on Chronic Hypoxic & Hypercarbic Respiratory Failure Tracheobronchitis vs HCAP (POA) Chronic Loculated R Pleural Effusion  In setting of recent bacteremia, chronic critical illness. Mild changes on CXR but difficult to discern if acute infectious process given baseline parenchymal disease. Additionally, there is likely a large component of generalized muscle  wasting / respiratory fatigue. Most recent ABG in July shows chronic compensated hypercarbia.  CO2 on BMP 42 on admit which is up from baseline.  -PRVC 8cc/kg as rest mode -await ABG findings, suspect increased hypercarbia  -wean O2 for sats >90% -follow intermittent CXR   Sepsis secondary to above (POA) Note recent staph epi bacteremia s/p 6 weeks abx, completed on 7/26 -assess PCT, blood cultures, tracheal aspirate, urine strep antigen, legionella -continue midodrine  -assess cortisol  -empiric cefepime, vancomycin   AKI Lactic Acidosis  Anticipate in setting of respiratory difficulty, hypotension. Likely AKI given low muscle mass and rise from baseline -follow lactic acid  -Trend BMP / urinary output -Replace electrolytes as indicated -Avoid nephrotoxic agents, ensure adequate renal perfusion  Anemia  Baseline Hgb ~9 -trend CBC  -transfuse for Hgb <7% or active bleeding   DM II with Hyperglycemia  -SSI, moderate scale   Adult Failure to Thrive (POA) Prolonged critical illness in the setting of Hodgkin's lymphoma, frequent admissions, poor intake / malnutrition. Albumin on presentation 2.1.  -DNR  -nutrition as able via PEG  -supportive care   Hodgkin's Lymphoma  Not a candidate for further therapy. Follows with Dr. Marin Olp.    Best Practice (right click and "Reselect all SmartList Selections" daily)  Diet/type: tubefeeds DVT prophylaxis: prophylactic heparin  GI prophylaxis: PPI Lines: N/A Foley:  N/A Code Status:  DNR Last date of multidisciplinary goals of care discussion: DNR (POA).   Labs   CBC: Recent Labs  Lab 12/07/2020 1130  WBC 26.6*  NEUTROABS 24.5*  HGB 9.4*  HCT 31.8*  MCV 98.8  PLT 016    Basic Metabolic Panel: Recent Labs  Lab 11/28/2020 1210  NA 138  K 4.2  CL 90*  CO2 42*  GLUCOSE 172*  BUN 28*  CREATININE 0.85  CALCIUM 11.8*   GFR: Estimated Creatinine Clearance: 75.4 mL/min (by C-G formula based on SCr of 0.85 mg/dL). Recent  Labs  Lab 11/23/2020 1130 11/11/2020 1210  WBC 26.6*  --   LATICACIDVEN  --  4.1*    Liver Function Tests: Recent Labs  Lab 12/08/2020 1210  AST 16  ALT 20  ALKPHOS 70  BILITOT 0.4  PROT 6.1*  ALBUMIN 2.1*   No results for input(s): LIPASE, AMYLASE in the last 168 hours. No results for input(s): AMMONIA in the last 168 hours.  ABG    Component Value Date/Time   PHART 7.318 (L) 10/14/2020 1146   PCO2ART 88.7 (HH) 10/14/2020 1146   PO2ART 114 (H) 10/14/2020 1146   HCO3 44.2 (H) 10/14/2020 1146   TCO2 48 (H) 08/16/2020 1600   O2SAT 98.6 10/14/2020 1146     Coagulation Profile: Recent Labs  Lab 11/20/2020 1210  INR 1.4*    Cardiac Enzymes: No results for input(s): CKTOTAL, CKMB, CKMBINDEX, TROPONINI in the last 168 hours.  HbA1C: Hgb A1c MFr Bld  Date/Time Value Ref Range Status  09/18/2020 12:12 PM  7.2 (H) 4.6 - 6.5 % Final    Comment:    Glycemic Control Guidelines for People with Diabetes:Non Diabetic:  <6%Goal of Therapy: <7%Additional Action Suggested:  >8%   05/25/2020 05:42 PM 5.8 (H) 4.8 - 5.6 % Final    Comment:    (NOTE)         Prediabetes: 5.7 - 6.4         Diabetes: >6.4         Glycemic control for adults with diabetes: <7.0     CBG: No results for input(s): GLUCAP in the last 168 hours.  Review of Systems:   Unable to complete with patient due to encephalopathy / lethargy. Information obtained from wife at bedside, prior medical documentation and staff at bedside.   Past Medical History:  He,  has a past medical history of Abscess of muscle (08/10/2011), Acute on chronic respiratory failure with hypoxia ([place]), Acute respiratory distress syndrome (ARDS) due to COVID-19 virus ([place]), Anxiety, Diabetes mellitus without complication ([place]), GERD (gastroesophageal reflux disease), HCAP (healthcare-associated pneumonia), Hip dysplasia, congenital, Hodgkin lymphoma ([place]), Hypertension, Pancreatitis, Pneumothorax, acute, Postoperative anemia due to acute blood  loss (09/07/2012), Septic arthritis of hip ([place]) (09/05/2012), and Sleep apnea.   Surgical History:   Past Surgical History:  Procedure Laterality Date   COLONOSCOPY  04/26/2007   HERNIA REPAIR     inguinal hernia x3   IR GASTROSTOMY TUBE MOD SED  08/22/2019   IR IMAGING GUIDED PORT INSERTION  03/29/2019   IR REMOVAL TUN ACCESS W/ PORT W/O FL MOD SED  09/22/2020   JOINT REPLACEMENT  2002 & 2007   bilateral hip replacement   LYMPH NODE BIOPSY Right 03/20/2019   Procedure: DEEP RIGHT SUPRACLAVICULAR LYMPH NODE EXCISION;  Surgeon: Fanny Skates, MD;  Location: [facility];  Service: General;  Laterality: Right;   MULTIPLE EXTRACTIONS WITH ALVEOLOPLASTY  07/28/2011   Procedure: MULTIPLE EXTRACION WITH ALVEOLOPLASTY;  Surgeon: Lenn Cal, DDS;  Location: [facility];  Service: Oral Surgery;  Laterality: N/A;  Extraction of tooth #'s 2,3,4,5,6,11,12,13,15,19,22 with alveoloplasty.   shoulder repair - right for separation of shoulder     TEE WITHOUT CARDIOVERSION  07/29/2011   Procedure: TRANSESOPHAGEAL ECHOCARDIOGRAM (TEE);  Surgeon: Josue Hector, MD;  Location: [facility];  Service: Cardiovascular;  Laterality: N/A;   TOTAL HIP REVISION Right 09/05/2012   Procedure: RIGHT HIP RESECTION ARTHROPLASTY WITH ANTIBIOTIC SPACERS;  Surgeon: [name], MD;  Location: [facility];  Service: Orthopedics;  Laterality: Right;   TOTAL HIP REVISION Right 11/30/2012   Procedure: RIGHT TOTAL HIP ARTHROPLASTY REIMPLANTATION;  Surgeon: [name], MD;  Location: [facility];  Service: Orthopedics;  Laterality: Right;     Social History:   reports that he has never smoked. He has never used smokeless tobacco. He reports current alcohol use. He reports that he does not use drugs.   Family History:  His family history includes Heart attack in his mother; Melanoma in his mother. There is no history of Hyperlipidemia, Sudden death, Hypertension, or Diabetes.   Allergies No Known Allergies   Home  Medications  Prior to Admission medications   Medication Sig Start Date End Date Taking? Authorizing Provider  albuterol (PROVENTIL) (2.5 MG/3ML) 0.083% nebulizer solution Take 3 mLs (2.5 mg total) by nebulization every 2 (two) hours as needed for wheezing. 07/28/20   Saguier, Percell Miller, PA-C  Amino Acids-Protein Hydrolys (FEEDING SUPPLEMENT, PRO-STAT SUGAR FREE 64,) LIQD Place 30 mLs into feeding tube daily.  [provider]  calamine lotion Apply topically as needed for itching. 07/18/20   Pokhrel, Corrie Mckusick, MD  chlorhexidine (PERIDEX) 0.12 % solution 15 mLs by Mouth Rinse route 2 (two) times daily. 04/17/20   Florencia Reasons, MD  Cholecalciferol (VITAMIN D3) 250 MCG (10000 UT) capsule Take 10,000 Units by mouth daily.    [provider]  guaiFENesin (ROBITUSSIN) 100 MG/5ML SOLN Place 5 mLs (100 mg total) into feeding tube every 4 (four) hours as needed for cough or to loosen phlegm. 07/18/20   Pokhrel, Corrie Mckusick, MD  HYOSYNE 0.125 MG/ML solution Take 0.125 mg by mouth every 4 (four) hours as needed for cramping. 07/20/20   [provider]  insulin glargine (LANTUS) 100 UNIT/ML injection Inject 0.1 mLs (10 Units total) into the skin at bedtime. 10/26/20   Georgette Shell, MD  lidocaine-prilocaine (EMLA) cream Apply to affected area once 07/09/20   Volanda Napoleon, MD  loperamide HCl (IMODIUM) 1 MG/7.5ML suspension Place 15 mLs (2 mg total) into feeding tube as needed for diarrhea or loose stools (maximum 13m per day). 07/20/20   Pokhrel, LCorrie Mckusick MD  LORazepam (ATIVAN) 0.5 MG tablet Take 1 tablet (0.5 mg total) by mouth every 6 (six) hours as needed (Nausea or vomiting). 10/26/20   MGeorgette Shell MD  midodrine (PROAMATINE) 10 MG tablet Take 1 tablet (10 mg total) by mouth 3 (three) times daily with meals. 10/26/20   MGeorgette Shell MD  Multiple Vitamin (MULTIVITAMIN) LIQD Place 15 mLs into feeding tube daily. 08/28/19   ODonita Brooks NP  Nutritional Supplements (FEEDING  SUPPLEMENT, OSMOLITE 1.2 CAL,) LIQD Place 1,000 mLs into feeding tube continuous. 04/17/20   XFlorencia Reasons MD  ondansetron (ZOFRAN) 4 MG tablet Take 1 tablet (4 mg total) by mouth every 6 (six) hours as needed for nausea. 07/18/20   Pokhrel, LCorrie Mckusick MD  pantoprazole (PROTONIX) 40 MG tablet Take 40 mg by mouth daily.    [provider]  sertraline (ZOLOFT) 100 MG tablet Take 1 tablet (100 mg total) by mouth daily. 10/26/20   MGeorgette Shell MD  traZODone (DESYREL) 100 MG tablet PLACE 1 TABLET (100 MG TOTAL) INTO FEEDING TUBE AT BEDTIME. 08/03/20   Saguier, EPercell Miller PA-C  triamcinolone (KENALOG) 0.1 % Apply 1 application topically 2 (two) times daily as needed (itching).    [provider]  vancomycin IVPB Inject 1,000 mg into the vein daily for 19 days. Indication:  MRSE Bacteremia  First Dose: No Last Day of Therapy:  11/03/2020 Labs - Sunday/Monday:  CBC/D, BMP, and vancomycin trough. Labs - Thursday:  BMP and vancomycin trough Labs - Every other week:  ESR and CRP Method of administration:Elastomeric Method of administration may be changed at the discretion of the patient and/or caregiver's ability to self-administer the medication ordered. 10/26/20 11/14/20  MGeorgette Shell MD     Critical care time: 361minutes     BNoe Gens MSN, APRN, NP-C, AGACNP-BC [facility] 11/10/2020, 1:52 PM   Please see [email or web address] for pager details.   From 7A-7P if no response, please call [phone] After hours, please call ELink [phone]

## 2020-11-09 NOTE — ED Triage Notes (Signed)
Patient BIB [facility] from home. Pt's daughter reported to EMS that he has been suffering from low O2 sats the past week. This AM she discovered his O2 sat was in the 54s. Pt is a chronic trach patient. Pt has a hx pf recurrent PNA and cancer. Recent port infection-treated with vancomycin. Pt rhonchorous and wheezing. Pt normally Aox4 and talkative but very lethargic today. VSS.  02 95% on 15 L via trach.

## 2020-11-09 NOTE — Procedures (Signed)
Tracheostomy Exchange Procedure Note  ORRY SIGL  [id number]  [birth year]  Date:11/27/2020  Time:5:47 PM   Provider Performing:Kashden Deboy D. Harris   Procedure: Tracheostomy Exchange Through Immature Stoma [phone])  Indication(s) Cuff leak with decreased return volumes on vent  Consent Unable to obtain consent due to emergent nature of procedure.  Anesthesia None   Time Out Verified patient identification, verified procedure, site/side was marked, verified correct patient position, special equipment/implants available, medications/allergies/relevant history reviewed, required imaging and test results available.   Sterile Technique Hand hygiene, gloves   Procedure Description Size 6 cuffed existing Shiley removed and size 6 XLT cuffed Shiley placed through stoma. Bronchoscopy was used to confirm placement, carina was visualized     Complications/Tolerance None; patient tolerated the procedure well..   EBL Minimal   Steve Banas D. Kenton Kingfisher, NP-C [facility] Personal contact information can be found on Amion  11/22/2020, 5:50 PM

## 2020-11-10 ENCOUNTER — Inpatient Hospital Stay (HOSPITAL_COMMUNITY): Payer: BC Managed Care – PPO

## 2020-11-10 DIAGNOSIS — J9622 Acute and chronic respiratory failure with hypercapnia: Secondary | ICD-10-CM

## 2020-11-10 DIAGNOSIS — J9621 Acute and chronic respiratory failure with hypoxia: Secondary | ICD-10-CM

## 2020-11-10 DIAGNOSIS — Z515 Encounter for palliative care: Secondary | ICD-10-CM

## 2020-11-10 DIAGNOSIS — Z7189 Other specified counseling: Secondary | ICD-10-CM

## 2020-11-10 LAB — GLUCOSE, CAPILLARY
Glucose-Capillary: 142 mg/dL — ABNORMAL HIGH (ref 70–99)
Glucose-Capillary: 149 mg/dL — ABNORMAL HIGH (ref 70–99)
Glucose-Capillary: 161 mg/dL — ABNORMAL HIGH (ref 70–99)
Glucose-Capillary: 174 mg/dL — ABNORMAL HIGH (ref 70–99)
Glucose-Capillary: 186 mg/dL — ABNORMAL HIGH (ref 70–99)

## 2020-11-10 LAB — CBC
HCT: 26.4 % — ABNORMAL LOW (ref 39.0–52.0)
Hemoglobin: 7.9 g/dL — ABNORMAL LOW (ref 13.0–17.0)
MCH: 29.2 pg (ref 26.0–34.0)
MCHC: 29.9 g/dL — ABNORMAL LOW (ref 30.0–36.0)
MCV: 97.4 fL (ref 80.0–100.0)
Platelets: 288 10*3/uL (ref 150–400)
RBC: 2.71 MIL/uL — ABNORMAL LOW (ref 4.22–5.81)
RDW: 15.4 % (ref 11.5–15.5)
WBC: 25.3 10*3/uL — ABNORMAL HIGH (ref 4.0–10.5)
nRBC: 0 % (ref 0.0–0.2)

## 2020-11-10 LAB — BASIC METABOLIC PANEL
Anion gap: 8 (ref 5–15)
BUN: 38 mg/dL — ABNORMAL HIGH (ref 8–23)
CO2: 37 mmol/L — ABNORMAL HIGH (ref 22–32)
Calcium: 11.5 mg/dL — ABNORMAL HIGH (ref 8.9–10.3)
Chloride: 94 mmol/L — ABNORMAL LOW (ref 98–111)
Creatinine, Ser: 0.99 mg/dL (ref 0.61–1.24)
GFR, Estimated: >60 mL/min (ref >60)
Glucose, Bld: 187 mg/dL — ABNORMAL HIGH (ref 70–99)
Potassium: 4.4 mmol/L (ref 3.5–5.1)
Sodium: 139 mmol/L (ref 135–145)

## 2020-11-10 LAB — PHOSPHORUS: Phosphorus: 3.3 mg/dL (ref 2.5–4.6)

## 2020-11-10 LAB — STREP PNEUMONIAE URINARY ANTIGEN: Strep Pneumo Urinary Antigen: NEGATIVE

## 2020-11-10 LAB — PROCALCITONIN: Procalcitonin: 0.3 ng/mL

## 2020-11-10 LAB — MAGNESIUM: Magnesium: 2.2 mg/dL (ref 1.7–2.4)

## 2020-11-10 MED ORDER — LORAZEPAM 2 MG/ML IJ SOLN
1.0000 mg | Freq: Once | INTRAMUSCULAR | Status: AC
  Administered 2020-11-10: 1 mg via INTRAVENOUS
  Filled 2020-11-10: qty 1

## 2020-11-10 MED ORDER — FUROSEMIDE 10 MG/ML IJ SOLN
40.0000 mg | Freq: Once | INTRAMUSCULAR | Status: AC
  Administered 2020-11-10: 40 mg via INTRAVENOUS
  Filled 2020-11-10: qty 4

## 2020-11-10 MED ORDER — OSMOLITE 1.5 CAL PO LIQD
1000.0000 mL | ORAL | Status: AC
  Administered 2020-11-10 – 2020-11-16 (×8): 1000 mL
  Filled 2020-11-10 (×9): qty 1000

## 2020-11-10 MED ORDER — FREE WATER
100.0000 mL | Status: DC
  Administered 2020-11-10 – 2020-11-16 (×37): 100 mL

## 2020-11-10 MED ORDER — CHLORHEXIDINE GLUCONATE 0.12 % MT SOLN
OROMUCOSAL | Status: AC
  Administered 2020-11-10: 15 mL via OROMUCOSAL
  Filled 2020-11-10: qty 15

## 2020-11-10 MED ORDER — LACTATED RINGERS IV BOLUS
1000.0000 mL | Freq: Once | INTRAVENOUS | Status: AC
  Administered 2020-11-10: 1000 mL via INTRAVENOUS

## 2020-11-10 MED ORDER — PROSOURCE TF PO LIQD
45.0000 mL | Freq: Three times a day (TID) | ORAL | Status: DC
  Administered 2020-11-10 – 2020-11-16 (×18): 45 mL
  Filled 2020-11-10 (×16): qty 45

## 2020-11-10 NOTE — TOC Initial Note (Signed)
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  No known increased secretions.  No documented fevers.  Arrived to ER more obtunded, new white count, question worsening L infiltrates on CXR and enlarging mediastinal adenopathy.   On exam, he is pale, poorly responsive except to sternal rub, has terrible rhonci bilaterally, passive on vent.  Has gurgling secretions around stoma, CXR looks like balloon may be widening stoma.  Has muscle wasting and MM dry.   A: -Acute on chronic hypoxemic and hypercarbic respiratory failure in patient with end stage multifactorial pulmonary fibrosis; question HCAP vs. tracheobronchitis -Refractory Hodgkin's Lymphoma -Chronic loculated R effusion related to prior infections -Recent staph aureus pneumonia post prolonged abx course -IDDM2 with hyperglycemia - Severe muscular deconditioning   P: - Vent support, check ABG, VAP prevention bundle - Broad spectrum abx, pct, tracheal aspirate, cortisol - Guarded prognosis, wife knows he is at high risk for death every time he is admitted  TOC PLAN OF CARE: Was sent home last month on a triology vent and hhc will follow for resumation of plan. Expected Discharge Plan: [place] Barriers to Discharge: Continued Medical Work up   Patient Goals and CMS Choice Patient states their goals for this hospitalization and ongoing recovery are:: on vent      Expected Discharge Plan and  Services Expected Discharge Plan: [place]   Discharge Planning Services: CM Consult   Living arrangements for the past 2 months: Mobile Home                                      Prior Living Arrangements/Services Living arrangements for the past 2 months: Mobile Home Lives with:: Spouse Patient language and need for interpreter reviewed:: Yes              Criminal Activity/Legal Involvement Pertinent to Current Situation/Hospitalization: No - Comment as needed  Activities of Daily Living Home Assistive Devices/Equipment: CBG Meter, Feeding equipment, Hospital bed, Eyeglasses, Vent/Trach supplies, Environmental consultant (specify type), Wheelchair, Other (Comment), Nebulizer, Oxygen, Dentures (specify type) (ramp entrance, front wheeled walker, upper/lower dentures, suction machine, peg) ADL Screening (condition at time of admission) Patient's cognitive ability adequate to safely complete daily activities?: Yes Is the patient deaf or have difficulty hearing?: No Does the patient have difficulty seeing, even when wearing glasses/contacts?: No Does the patient have difficulty concentrating, remembering, or making decisions?: No Patient able to express need for assistance with ADLs?: Yes Does the patient have difficulty dressing or bathing?: Yes Independently performs ADLs?: No Communication: Independent Dressing (OT): Needs assistance Is this a change from baseline?: Pre-admission baseline Grooming: Independent Feeding: Independent Bathing: Needs assistance Is this a change from baseline?: Pre-admission baseline Toileting: Needs assistance Is this a change from baseline?: Pre-admission baseline In/Out Bed: Needs assistance Is this a change from baseline?: Pre-admission baseline Walks in Home: Needs assistance Is this a  change from baseline?: Pre-admission baseline Does the patient have difficulty walking or climbing stairs?: Yes Weakness of Legs: Both Weakness of  Arms/Hands: Both  Permission Sought/Granted                  Emotional Assessment Appearance:: Appears older than stated age Attitude/Demeanor/Rapport: Intubated (Following Commands or Not Following Commands) Affect (typically observed): Unable to Assess Orientation: : Fluctuating Orientation (Suspected and/or reported Sundowners) Alcohol / Substance Use: Tobacco Use Psych Involvement: No (comment)  Admission diagnosis:  Ileus ([place]) [K56.7] Pneumonia [J18.9] Patient Active Problem List   Diagnosis Date Noted   Respiratory distress    Bacteremia 09/23/2020   Failure to thrive in adult 09/19/2020   Sepsis due to pneumonia ([place]) 08/13/2020   Acute on chronic respiratory failure with hypoxia and hypercapnia ([place]) 07/21/2020   DM (diabetes mellitus), type 2 ([place]) 07/15/2020   Pneumonia 07/14/2020   Pulmonary fibrosis ([place]) 05/26/2020   Bronchiectasis ([place]) 05/26/2020   Acute hypercapnic respiratory failure ([place]) 05/26/2020   Acute on chronic respiratory failure with hypoxia ([place]) 05/25/2020   Uncontrolled type 2 diabetes mellitus with hyperglycemia, with long-term current use of insulin ([place]) 05/25/2020   Essential hypertension 05/25/2020   GERD without esophagitis 05/25/2020   Debility    Anxiety    Tracheostomy care ([place])    Pneumothorax on right 03/23/2020   Chronic respiratory failure with hypoxia ([place])    ARDS (adult respiratory distress syndrome) ([place])    Hodgkin's lymphoma ([place])    Pneumothorax, acute    Healthcare-associated pneumonia    COVID-19 virus infection    On mechanically assisted ventilation ([place])    Dysphagia    Pneumothorax    Subcutaneous air ([place])    Tracheostomy dependence ([place])    Pneumomediastinum ([place])    Acute respiratory distress syndrome (ARDS) due to COVID-19 virus ([place])    Pressure ulcer 07/27/2019   Acute respiratory failure with hypoxemia ([place])    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/20/2012   Septic arthritis of hip ([place]) 09/05/2012   Routine general medical examination at a health care facility 09/12/2011   Elbow pain 09/12/2011   Abscess of muscle 08/10/2011   H/O dental abscess 08/10/2011   Staphylococcus aureus bacteremia 07/27/2011   Abdominal pain, RLQ 09/02/2010   HYPOGONADISM, MALE 03/23/2007   SLEEP APNEA 03/23/2007   PCP:  [name], PA-C Pharmacy:   [id number] [place], [address] [place] [address] Phone: [phone] Fax: [phone]     Social Determinants of Health (SDOH) Interventions    Readmission Risk Interventions Readmission Risk Prevention Plan 09/22/2020 08/24/2020 07/22/2019  Transportation Screening Complete Complete Complete  Medication Review Press photographer) Complete Complete Complete  HRI or Home Care Consult Complete Complete -  SW Recovery Care/Counseling Consult Complete Complete -  Palliative Care Screening Not Applicable Not Applicable -  [place] Not Applicable Not Applicable -  Some recent data might be hidden

## 2020-11-10 NOTE — H&P (Signed)
NAME:  JAMIR RONE, MRN:  [id number], DOB:  09-Nov-1955, LOS: 1 ADMISSION DATE:  11/20/2020, CONSULTATION DATE:  8/1 REFERRING MD:  Dr. Colin Mulders, CHIEF COMPLAINT:  Increased vent use   BRIEF  65 y/o M who presented to [facility] ER on 8/1 with reports of increased vent use over the preceding 4 days and desaturations.    The patient is followed by Dr. Marin Olp for Hodgin's Lymphoma and was deemed not a candidate for further treatment due to his functional status. He was last reviewed on 11/03/20 with plan for follow up in 4 weeks to reassess functional status. He has had a prolonged chronic critical illness since March of 2021 - including lymphoma, COVID, ARDS thought multifactorial in the setting of COVID and possible pneumonitis from immunotherapy, chronic trach/vent, multiple chest tubes, malnutrition, bacteremia, frequent readmissions and failure to thrive.   He was recently admitted from 6/11 - 7/18 for poor appetite and malaise.  Work up at that time found him to be positive for staph epidermidis bacteremia.  He was seen by ID with a discharge plan of 6 weeks of IV antibiotics (first negative blood culture 09/22/20).    His wife reports the patient has been requiring the home vent consistently over the last four days.  He previously had been on the ATC during the day times.  He has home health that come in to assist in his care 2x per week.  His wife and the home care RN noted he used the urinal on 8/1 and desaturated into 80's. They re-checked the sats via home health RN's meter and they were in the 50's.  EMS was activated. On ER arrival, he was noted to be somnolent on the vent and lethargic.  ABG evaluation pending. Most recent ABG in July 7.318 / 88.   CXR review showed known severe diffuse bilateral airspace disease, moderate right pleural effusion. Wife reports he has not had his midodrine until he got to the ER.  Initial labs notable for CO2 of 42, glucose 172, BUN 28 / Sr Cr 0.85 (up from 0.5),  albumin 2.1, lactic acid 4.1, WBC 26.6, Hgb 9.4, and platelets 306.    PCCM consulted for evaluation of hypoxic respiratory failure.   Pertinent  Medical History  Hodgkin's Lymphoma COVID with ARDS - 2021 s/p vent trach  Hx Pneumothorax, Loculated Effusion GERD HTN  Pancreatitis   Significant Hospital Events: Including procedures, antibiotic start and stop dates in addition to other pertinent events   8/1 Admit with AMS -> cuff leak with size 6 trach with distal end was 7cm above carina and stoma too big -> chagned to 6 XLT and distal end 4cm above carina and less leak  Interim History / Subjective:   8/2 -better ventilator mechanics after change in tracheostomy to size 6 XLT.  He is currently on 60% FiO2 and 5 of PEEP.  He came off Levophed but then blood pressure dropped and he is getting fluid bolus now.  Nurses that he generally runs lower level of blood pressure.  Also anxiety.  Noted to be on Ativan at home.  He just got a dose and he is more comfortable  Objective   Blood pressure 107/61, pulse 74, temperature 98.8 F (37.1 C), temperature source Axillary, resp. rate (!) 23, height 5\' 5"  (1.651 m), weight 67.7 kg, SpO2 99 %. CVP:  [6 mmHg-15 mmHg] 6 mmHg  Vent Mode: PRVC FiO2 (%):  [60 %-100 %] 60 % Set Rate:  [14 bmp-28  bmp] 14 bmp Vt Set:  [490 mL] 490 mL PEEP:  [5 cmH20] 5 cmH20 Plateau Pressure:  [25 cmH20-30 cmH20] 30 cmH20   Intake/Output Summary (Last 24 hours) at 11/10/2020 0931 Last data filed at 11/10/2020 0800 Gross per 24 hour  Intake 3786.98 ml  Output 335 ml  Net 3451.98 ml   Filed Weights   11/12/2020 1100 11/20/2020 1122 11/10/20 0500  Weight: 63.9 kg 63.9 kg 67.7 kg    Examination: General Appearance:  debilitated, deconditioned, on vent Head:  Normocephalic, without obvious abnormality, atraumatic Eyes:  PERRL - yes, conjunctiva/corneas - mudd     Ears:  Normal external ear canals, both ears Nose:  G tube - no Throat:  ETT TUBE - no , OG tube - no.  Size 6XLT Trach Neck:  Supple,  No enlargement/tenderness/nodules Lungs: Clear to auscultation bilaterally, Ventilator   Synchrony - yes, 60% fio2, peep 5 Heart:  S1 and S2 normal, no murmur, CVP - no.  Pressors - no Abdomen:  Soft, no masses, no organomegaly Genitalia / Rectal:  Not done Extremities:  Extremities- intact Skin:  ntact in exposed areas . Sacral area - not examined Neurologic:  Sedation - ativan prn -> RASS - -1 . Moves all 4s - yes. CAM-ICU - cannot test . Orientation - cannot test    LABS    PULMONARY Recent Labs  Lab 12/01/2020 1454  PHART 7.403  PCO2ART 68.1*  PO2ART 75.0*  HCO3 42.4*  O2SAT 96.1    CBC Recent Labs  Lab 11/29/2020 1130 11/16/2020 1703 11/10/20 0530  HGB 9.4* 8.1* 7.9*  HCT 31.8* 27.3* 26.4*  WBC 26.6* 20.7* 25.3*  PLT 306 235 288    COAGULATION Recent Labs  Lab 11/28/2020 1210  INR 1.4*    CARDIAC  No results for input(s): TROPONINI in the last 168 hours. No results for input(s): PROBNP in the last 168 hours.   CHEMISTRY Recent Labs  Lab 12/06/2020 1210 11/10/20 0530  NA 138 139  K 4.2 4.4  CL 90* 94*  CO2 42* 37*  GLUCOSE 172* 187*  BUN 28* 38*  CREATININE 0.85 0.99  CALCIUM 11.8* 11.5*  MG  --  2.2  PHOS  --  3.3   Estimated Creatinine Clearance: 64.7 mL/min (by C-G formula based on SCr of 0.99 mg/dL).   LIVER Recent Labs  Lab 11/30/2020 1210  AST 16  ALT 20  ALKPHOS 70  BILITOT 0.4  PROT 6.1*  ALBUMIN 2.1*  INR 1.4*     INFECTIOUS Recent Labs  Lab 11/15/2020 1210 11/15/2020 1433 11/10/20 0530  LATICACIDVEN 4.1* 1.2  --   PROCALCITON 0.44  --  0.30     ENDOCRINE CBG (last 3)  Recent Labs    11/12/2020 1929 12/06/2020 2321 11/10/20 0722  GLUCAP 178* 185* 174*         IMAGING x48h  - image(s) personally visualized  -   highlighted in bold DG Chest 1 View  Result Date: 11/10/2020 CLINICAL DATA:  Pneumonia. EXAM: CHEST  1 VIEW COMPARISON:  11/25/2020. FINDINGS: Endotracheal tube and right PICC  line stable position. Severe cardiomegaly again noted. Diffuse bilateral pulmonary infiltrates and moderate right pleural effusion again noted. Surgical clips right upper chest. Degenerative change thoracic spine. IMPRESSION: 1.  Tracheostomy tube and right PICC line stable position. 2.  Severe cardiomegaly again noted. 3. Diffuse bilateral pulmonary infiltrates and moderate right-sided pleural effusion again noted. Similar findings noted on prior chest x-ray. Electronically Signed   By: Marcello Moores  Register   On: 11/10/2020 07:11   DG Abd 1 View  Result Date: 12/07/2020 CLINICAL DATA:  Ileus EXAM: ABDOMEN - 1 VIEW COMPARISON:  09/24/2019 FINDINGS: Two supine frontal views of the abdomen and pelvis are obtained. Percutaneous gastrostomy tube overlies left upper quadrant. No bowel obstruction or ileus. Gas is seen throughout the small and large bowel to the level of the rectum. No masses or abnormal calcifications. Bibasilar airspace disease and right pleural effusion again noted. IMPRESSION: 1. Unremarkable bowel gas pattern. 2. Stable bilateral airspace disease and right pleural effusion. Electronically Signed   By: Randa Ngo M.D.   On: 12/07/2020 15:06   DG Chest Portable 1 View  Result Date: 11/10/2020 CLINICAL DATA:  Shortness of breath EXAM: PORTABLE CHEST 1 VIEW COMPARISON:  09/24/2020 FINDINGS: Severe diffuse bilateral airspace disease. Moderate right pleural effusion peripherally around the right lung. There is cardiomegaly. Tracheostomy is un changed. Right PICC line in place with the tip at the cavoatrial junction. IMPRESSION: Severe diffuse bilateral airspace disease, not significantly changed since prior study. Moderate right pleural effusion. Electronically Signed   By: Rolm Baptise M.D.   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, chronic disease - protein calorie malnutrition - severe, s;p PEG tube - cachexia with failure to thrive - dpression and anxiety  - baesline zoloft and ativan - chronic hypotension - baseline midodrine - Soft Tissue Pressure Ulcer - baseline present on admit since May 2022 - stage 09 September 2020 - ECOG 4 baseline - bed bound  - T2DM with poor control - Recurrrent sepsis admit   Acute on Chronic Hypoxic & Hypercarbic Respiratory Failure - udnerlying severe pulmonary fibrosis post covid (present on admit) Tracheobronchitis vs HCAP (POA) Chronic Loculated R Pleural Effusion  (present on admit)  11/10/2020 - readmitted 11/26/2020 with worsneing resp drive due to likely sepsis and cuff leak with 6 sizzed Track. S/p upsize to 6 XLT  Plan - cotninue full vent support - VAP bundle - continue size 6 XLT trach   Sepsis secondary to above (POA) - Note recent staph epi bacteremia s/p 6 weeks abx, completed on 7/26  11/10/2020 - afebrile and MRSA PCR negative. PCT profil eindicateds possibly of localized seppsis  Plan  - Continue cefepime - await  , blood cultures, tracheal aspirate, urine strep antigen, legionella  Chronic hypotennsion - baesline prior to admit Circulatory shock - present on admit  11/10/2020 - volume depletion v sepsis. BAck on levophed this morning   Plan -continue midodrine  -- fluid bolus  - levophed - goal MAP > 65 or SBP 95/MAP > 60  Baseline creat 0.55mg  - 0.7mg  AKI at admit - 0.88mg %  11/10/2020 - ? Creat worsening  Plan  - fluid, maintain BP/HR -Trend BMP / urinary output -Replace electrolytes as indicated -Avoid nephrotoxic agents, ensure adequate renal perfusion  Anemia of chronic diseae  - Baseline Hgb ~9  11/10/2020-mid 7s hgb. No active bleed  Plan  - - PRBC for hgb </= 6.9gm%    - exceptions are   -  if ACS susepcted/confirmed then transfuse for hgb </= 8.0gm%,  or    -  active bleeding with hemodynamic instability, then transfuse regardless of hemoglobin value   At at all times try to transfuse 1 unit prbc as possible with exception of active hemorrhage     DM II with Hyperglycemia with poor control  Plan -SSI, moderate scale   Adult Failure to Thrive (POA) with severe baeline protein calorie malnutriton- Prolonged critical illness in the setting of Hodgkin's lymphoma, frequent admissions, poor intake / malnutrition. Albumin on presentation 2.1.   Plan -nutrition as able via PEG  -supportive care   Hodgkin's Lymphoma - relapsed, advanced  Plan Not a candidate for further therapy. Follows with Dr. Marin Olp.    Code status   - DNR since admit 12/04/2020 but full medical care  Best Practice (right click and "Reselect all SmartList Selections" daily)  Diet/type: tubefeeds DVT prophylaxis: prophylactic heparin  GI prophylaxis: PPI Lines: N/A Foley:  N/A Code Status:  DNR Last date of multidisciplinary goals of care discussion: DNR (POA).      ATTESTATION & SIGNATURE   The patient RITHY MANDLEY is critically ill with multiple organ systems failure and requires high complexity decision making for assessment and support, frequent evaluation and titration of therapies, application of advanced monitoring technologies and extensive interpretation of multiple databases.   Critical Care Time devoted to patient care services described in this note is  40  Minutes. This time reflects time of care of this signee Dr Brand Males. This critical care time does not reflect procedure time, or teaching time or supervisory time of PA/NP/Med student/Med Resident etc but could involve care discussion time     Dr. Brand Males, M.D., [facility].C.P Pulmonary and  Critical Care Medicine Staff Physician [place] [facility] Pager: [phone], If no answer or between  15:00h - 7:00h: call [phone]  11/10/2020 10:15 AM

## 2020-11-10 NOTE — Progress Notes (Signed)
Initial Nutrition Assessment  DOCUMENTATION CODES:   Non-severe (moderate) malnutrition in context of chronic illness  INTERVENTION:  - will adjust TF regimen to better meet needs: Osmolite 1.5 @ 55 ml/hr with 45 ml Prosource TF TID and 100 ml free water every 4 hours.  - this regimen will provide 2100 kcal, 116 grams protein, and 1606 ml free water.    NUTRITION DIAGNOSIS:   Moderate Malnutrition related to chronic illness, cancer and cancer related treatments as evidenced by severe fat depletion, moderate muscle depletion.  GOAL:   Patient will meet greater than or equal to 90% of their needs  MONITOR:   Vent status, TF tolerance, Labs, Weight trends, Skin  REASON FOR ASSESSMENT:   Malnutrition Screening Tool, Consult Enteral/tube feeding initiation and management  ASSESSMENT:   65 year-old male with medical history of Hodgkin's lymphoma (not a candidate for further treatment d/t functional status), COVID-19 with resultant need for trach, FTT, severe malnutrition, congenital hip dysplasia, GERD, sleep apnea, DM, HTN, and anxiety. He presented to the ED on 8/1 d/t increased vent use in the previous 4 days. He was admitted 6/11-7/18 d/t poor appetite and malaise.  Patient laying in bed and is on vent via trach. He has PEG in place and is receiving Osmolite 1.2 @ 45 ml/hr with 30 ml free water every 4 hours. This regimen is providing 1296 kcal, 60 grams protein, and 1066 ml free water.   Able to talk with RN who reports patient is tolerating TF regimen without issue and no issues with administering medications.   No family or visitors present. Patient is well-known to this RD from previous admissions. He initially indicates that he has abdominal discomfort but then denied abdominal pain/pressure/cramping and denied nausea.   Weight yesterday was documented as 141 lb and today as 149 lb. Weight on 10/26/20 was 141 lb.    Patient is currently intubated on ventilator support via  trach MV: 12.1 L/min Temp (24hrs), Avg:97.6 F (36.4 C), Min:96.9 F (36.1 C), Max:98.8 F (37.1 C)   Labs reviewed; CBG: 174 mg/dl, Cl: 94 mmol/l, BUN: 38 mg/dl, Ca: 11.5 mg/dl. Medications reviewed; 1000 units cholecalciferol/day, sliding scale novolog, 2 mg PRN imodium, 15 ml multivitamin/day.    NUTRITION - FOCUSED PHYSICAL EXAM:  Flowsheet Row Most Recent Value  Orbital Region Severe depletion  Upper Arm Region Moderate depletion  Thoracic and Lumbar Region Unable to assess  Buccal Region Severe depletion  Temple Region Severe depletion  Clavicle Bone Region Moderate depletion  Clavicle and Acromion Bone Region Moderate depletion  Scapular Bone Region Unable to assess  Dorsal Hand Moderate depletion  Patellar Region Moderate depletion  Anterior Thigh Region Moderate depletion  Posterior Calf Region Moderate depletion  Edema (RD Assessment) Mild  [BLE]  Hair Reviewed  Eyes Reviewed  Mouth Reviewed  Skin Reviewed  Nails Reviewed       Diet Order:   Diet Order             Diet NPO time specified  Diet effective now                   EDUCATION NEEDS:   No education needs have been identified at this time  Skin:  Skin Assessment: Skin Integrity Issues: Skin Integrity Issues:: Stage I, Other (Comment) Stage I: sacrum Other: MASD to mid-buttocks; MASD to lower R flank  Last BM:  7/30 (2 days PTA)  Height:   Ht Readings from Last 1 Encounters:  11/23/2020 5\' 5"  (  1.651 m)    Weight:   Wt Readings from Last 1 Encounters:  11/10/20 67.7 kg      Estimated Nutritional Needs:  Kcal:  2000-2200 kcal Protein:  110-125 grams Fluid:  >/= 2 L/day     Jarome Matin, MS, RD, LDN, CNSC Inpatient Clinical Dietitian RD pager # available in [facility]  After hours/weekend pager # available in [facility]

## 2020-11-10 NOTE — Consult Note (Signed)
Consultation Note Date: 11/10/2020   Patient Name: Steve [name]  DOB: 1955-07-29  MRN: [id number]  Age / Sex: 65 y.o., male  PCP: Elise Benne Referring Physician: Brand Males, MD  Reason for Consultation: Establishing goals of care  HPI/Patient Profile: 65 y.o. 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 Trach exchanged for shiley 6 XLT with some improvement in oxygenation.   Clinical Assessment and Goals of Care: I met today at [facility] bedside. He is resting comfortably and has received medication for anxiety earlier per RN. Steve [name] nods his head yes/no but does not open his eyes. He indicates through head nod that he feels less anxious and is doing okay. He denies pain. He indicates that he does not need anything and just wants to rest. He gives me permission to call and speak with his wife.   I called and spoke with Steve [name] (wife). She is at work and unable to really speak very long. I introduce myself and she is familiar with palliative care from previous consultation. Steve [name] clarifies that Steve [name] has been able to make decisions for himself and they both agree that he would not benefit from CPR but remain open to all other interventions at this time to prolong life. This is consistent with previous palliative visits with desire to spend as much time with his wife and children as possible. If his health condition were to progress to the stage he is unable to have meaningful interactions with his family I believe this  would alter their plan of care. Steve [name] does report that Steve [name] has continued to require 24/7 ventilator support prior to admission as he is unable to keep his sats up. At this time Steve [name] has no further questions or concerns. She reiterates that Steve [name] continues to make his own decisions and she is supportive of these decisions.   I will follow up tomorrow and see if I can engage Steve [name] in conversation any further to offer support. Wife Steve [name] is available by phone but works and is more available early morning or after 6pm.   Steve [name] denies any further questions or concerns. I provided her with my contact information. Emotional support provided.   Primary Decision Maker PATIENT and surrogate is his wife, Steve [name]    SUMMARY OF RECOMMENDATIONS   - DNR but desire for full scope care otherwise and continuation of ventilator support.   Code Status/Advance Care Planning: DNR   Symptom Management:  Per PCCM  Palliative Prophylaxis:  Aspiration, Bowel Regimen, Delirium Protocol, Frequent Pain Assessment, and Turn Reposition  Additional Recommendations (Limitations, Scope, Preferences): Full Scope Treatment  Psycho-social/Spiritual:  Desire for further Chaplaincy support:no Additional Recommendations: Caregiving  Support/Resources and Grief/Bereavement Support  Prognosis:  Overall prognosis poor.   Discharge Planning:  To Be Determined      Primary Diagnoses: Present on Admission:  Pneumonia   I have reviewed the medical record, interviewed the patient and family, and examined the patient. The following aspects are pertinent.  Past Medical History:  Diagnosis Date   Abscess of muscle 08/10/2011   staph infection of right hip    Acute on chronic respiratory failure with hypoxia ([name])    Acute respiratory distress syndrome (ARDS) due to COVID-19 virus ([name])    Anxiety    Diabetes mellitus without complication ([name])    GERD (gastroesophageal reflux disease)    rare reflux - no  meds for reflux - NO PROBLEM IN PAST SEVERAL YRS   HCAP (healthcare-associated pneumonia)    Hip dysplasia, congenital    no surgery as a child for hip dysplasia - has had bilateral hip replacements as an adult   Hodgkin lymphoma ([place])    Hypertension    Pancreatitis    Pneumothorax, acute    Postoperative anemia due to acute blood loss 09/07/2012   Septic arthritis of hip ([place]) 09/05/2012   PT'S TOTAL HIP JOINT REMOVED - ANTIBIOTIC SPACE PLACED AND PT HAS FINISHED IV ANTIBIOTICS ( PICC LINE REMOVED)   Sleep apnea    USES CPAP   Social History   Socioeconomic History   Marital status: Married    Spouse name: Not on file   Number of children: Not on file   Years of education: Not on file   Highest education level: Not on file  Occupational History   Not on file  Tobacco Use   Smoking status: Never   Smokeless tobacco: Never  Vaping Use   Vaping Use: Never used  Substance and Sexual Activity   Alcohol use: Yes    Comment: rare beer. 5 times a year at most. 2 beers when he drinks.   Drug use: No   Sexual activity: Yes    Partners: Female  Other Topics Concern   Not on file  Social History Narrative   Married, 1 son and 3 sons.   Welder/fabricator   2 caffeinated beverages daily   Social Determinants of Health   Financial Resource Strain: Not on file  Food Insecurity: Not on file  Transportation Needs: Not on file  Physical Activity: Not on file  Stress: Not on file  Social Connections: Not on file   Family History  Problem Relation Age of Onset   Melanoma Mother    Heart attack Mother    Hyperlipidemia Neg Hx    Sudden death Neg Hx    Hypertension Neg Hx    Diabetes Neg Hx    Scheduled Meds:  chlorhexidine gluconate (MEDLINE KIT)  15 mL Mouth Rinse BID   Chlorhexidine Gluconate Cloth  6 each Topical Daily   cholecalciferol  1,000 Units Oral Daily   feeding supplement (VITAL HIGH PROTEIN)  1,000 mL Per Tube Q24H   heparin  5,000 Units Subcutaneous Q8H    insulin aspart  0-15 Units Subcutaneous Q4H   midodrine  10 mg Per Tube TID WC   multivitamin  15 mL Per Tube Daily   pantoprazole (PROTONIX) IV  40 mg Intravenous QHS   sertraline  100 mg Per Tube Daily   Continuous Infusions:  ceFEPime (MAXIPIME) IV Stopped (11/10/20 0309)   feeding supplement (OSMOLITE 1.2 CAL) 1,000 mL (12/05/2020 1809)   norepinephrine (LEVOPHED) Adult infusion 2 mcg/min (11/10/20 0800)   PRN Meds:.acetaminophen (TYLENOL) oral liquid 160 mg/5 mL, calamine, docusate sodium, guaiFENesin,  hyoscyamine, ipratropium-albuterol, loperamide HCl, polyethylene glycol, triamcinolone cream No Known Allergies Review of Systems  Unable to perform ROS: Acuity of condition   Physical Exam Constitutional:      Appearance: He is cachectic. He is ill-appearing.     Interventions: He is intubated.  Cardiovascular:     Rate and Rhythm: Normal rate.  Pulmonary:     Effort: No tachypnea, accessory muscle usage or respiratory distress. He is intubated.  Abdominal:     General: Abdomen is flat.     Palpations: Abdomen is soft.  Neurological:     Mental Status: He is lethargic.     Comments: Too sleepy to engage in conversation at current time    Vital Signs: BP (!) 88/50   Pulse 65   Temp 98.8 F (37.1 C) (Axillary)   Resp (!) 24   Ht _0  (1.651 m)   Wt 67.7 kg   SpO2 98%   BMI 24.84 kg/m  Pain Scale: 0-10   Pain Score: Asleep   SpO2: SpO2: 98 % O2 Device:SpO2: 98 % O2 Flow Rate: .   IO: Intake/output summary:  Intake/Output Summary (Last 24 hours) at 11/10/2020 1045 Last data filed at 11/10/2020 0900 Gross per 24 hour  Intake 3916.98 ml  Output 710 ml  Net 3206.98 ml    LBM: Last BM Date: 11/07/20 Baseline Weight: Weight: 63.9 kg Most recent weight: Weight: 67.7 kg     Palliative Assessment/Data:     Time In: 1230 Time Out: 1305 Time Total: 35 min Greater than 50%  of this time was spent counseling and coordinating care related to the above assessment  and plan.  Signed by: Vinie Sill, NP Palliative Medicine Team Pager # [phone] (M-F 8a-5p) Team Phone # [phone] (Nights/Weekends)

## 2020-11-10 NOTE — Progress Notes (Signed)
eLink Physician-Brief Progress Note Patient Name: Steve [name] DOB: [birth year] MRN: [id number]   Date of Service  11/10/2020  HPI/Events of Note  CXR this AM with diffuse bilateral opacities c/w known baseline pulmonary fibrosis from COVID and Brentuximab induced lung injury. CVP = 10. Creatinine = 0.99.  eICU Interventions  Plan: Lasix 40 mg IV X 1 now.      Intervention Category Major Interventions: Hypoxemia - evaluation and management  [name] 11/10/2020, 6:29 AM

## 2020-11-11 DIAGNOSIS — L299 Pruritus, unspecified: Secondary | ICD-10-CM

## 2020-11-11 DIAGNOSIS — J9621 Acute and chronic respiratory failure with hypoxia: Secondary | ICD-10-CM | POA: Diagnosis not present

## 2020-11-11 DIAGNOSIS — J189 Pneumonia, unspecified organism: Secondary | ICD-10-CM | POA: Diagnosis not present

## 2020-11-11 DIAGNOSIS — Z66 Do not resuscitate: Secondary | ICD-10-CM | POA: Diagnosis not present

## 2020-11-11 LAB — GLUCOSE, CAPILLARY
Glucose-Capillary: 223 mg/dL — ABNORMAL HIGH (ref 70–99)
Glucose-Capillary: 189 mg/dL — ABNORMAL HIGH (ref 70–99)
Glucose-Capillary: 214 mg/dL — ABNORMAL HIGH (ref 70–99)
Glucose-Capillary: 248 mg/dL — ABNORMAL HIGH (ref 70–99)
Glucose-Capillary: 175 mg/dL — ABNORMAL HIGH (ref 70–99)
Glucose-Capillary: 152 mg/dL — ABNORMAL HIGH (ref 70–99)
Glucose-Capillary: 141 mg/dL — ABNORMAL HIGH (ref 70–99)

## 2020-11-11 LAB — PROCALCITONIN: Procalcitonin: 0.3 ng/mL

## 2020-11-11 LAB — BASIC METABOLIC PANEL
Anion gap: 7 (ref 5–15)
BUN: 40 mg/dL — ABNORMAL HIGH (ref 8–23)
CO2: 38 mmol/L — ABNORMAL HIGH (ref 22–32)
Calcium: 10.8 mg/dL — ABNORMAL HIGH (ref 8.9–10.3)
Chloride: 93 mmol/L — ABNORMAL LOW (ref 98–111)
Creatinine, Ser: 0.94 mg/dL (ref 0.61–1.24)
GFR, Estimated: >60 mL/min (ref >60)
Glucose, Bld: 200 mg/dL — ABNORMAL HIGH (ref 70–99)
Potassium: 3.8 mmol/L (ref 3.5–5.1)
Sodium: 138 mmol/L (ref 135–145)

## 2020-11-11 LAB — CBC
HCT: 24.8 % — ABNORMAL LOW (ref 39.0–52.0)
Hemoglobin: 7.6 g/dL — ABNORMAL LOW (ref 13.0–17.0)
MCH: 29.3 pg (ref 26.0–34.0)
MCHC: 30.6 g/dL (ref 30.0–36.0)
MCV: 95.8 fL (ref 80.0–100.0)
Platelets: 284 10*3/uL (ref 150–400)
RBC: 2.59 MIL/uL — ABNORMAL LOW (ref 4.22–5.81)
RDW: 15.6 % — ABNORMAL HIGH (ref 11.5–15.5)
WBC: 19.7 10*3/uL — ABNORMAL HIGH (ref 4.0–10.5)
nRBC: 0 % (ref 0.0–0.2)

## 2020-11-11 LAB — LEGIONELLA PNEUMOPHILA SEROGP 1 UR AG
L. pneumophila Serogp 1 Ur Ag: NEGATIVE
L. pneumophila Serogp 1 Ur Ag: NEGATIVE

## 2020-11-11 LAB — MAGNESIUM: Magnesium: 2.1 mg/dL (ref 1.7–2.4)

## 2020-11-11 LAB — PHOSPHORUS: Phosphorus: 3.1 mg/dL (ref 2.5–4.6)

## 2020-11-11 MED ORDER — MORPHINE SULFATE 10 MG/5ML PO SOLN
2.5000 mg | ORAL | Status: DC | PRN
Start: 2020-11-11 — End: 2020-11-12
  Administered 2020-11-11 – 2020-11-12 (×2): 2.5 mg
  Filled 2020-11-11 (×2): qty 5

## 2020-11-11 MED ORDER — LACTATED RINGERS IV SOLN: INTRAVENOUS | Status: AC

## 2020-11-11 MED ORDER — DIPHENHYDRAMINE-ZINC ACETATE 2-0.1 % EX CREA
TOPICAL_CREAM | Freq: Two times a day (BID) | CUTANEOUS | Status: DC | PRN
  Filled 2020-11-11: qty 28

## 2020-11-11 MED ORDER — LORAZEPAM 0.5 MG PO TABS
0.5000 mg | ORAL_TABLET | ORAL | Status: DC | PRN
  Administered 2020-11-11 – 2020-11-13 (×3): 0.5 mg
  Filled 2020-11-11 (×4): qty 1

## 2020-11-11 MED ORDER — HYDROXYZINE HCL 10 MG/5ML PO SYRP
25.0000 mg | ORAL_SOLUTION | Freq: Three times a day (TID) | ORAL | Status: DC | PRN
  Administered 2020-11-11 – 2020-11-13 (×4): 25 mg
  Filled 2020-11-11 (×7): qty 12.5

## 2020-11-11 MED ORDER — MORPHINE SULFATE 10 MG/5ML PO SOLN: 5.0000 mg | ORAL | Status: DC | PRN

## 2020-11-11 MED ORDER — MORPHINE SULFATE 10 MG/5ML PO SOLN
2.5000 mg | ORAL | Status: DC | PRN
Start: 2020-11-11 — End: 2020-11-11
  Administered 2020-11-11: 2.5 mg via ORAL
  Filled 2020-11-11: qty 5

## 2020-11-11 NOTE — Progress Notes (Signed)
Palliative:  HPI: 65 y.o. 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 Trach exchanged for shiley 6 XLT with some improvement in oxygenation.    I met today with Steve [name]. He is labored on vent and working hard. He is much more alert and interactive today. I discussed with him my conversation with his wife and my understanding of his goals. He confirmed via head nods desire for DNR, continued vent support, continued treatment and return to the hospital if needed. I clarified his meaning of being tired of going through this and he is tired of his declining health but not to the point of placing further limitations on care. I also discussed with Steve [name] about the potential of adding some low dose morphine to give him some relief from his shortness of breath knowing this could make him more sleepy and he does wish to try this medication to have some relief. He wishes to continue with treating the treatable but also wants comfort and relief from symptoms. Goals remain clear. Steve [name] has no further questions or concerns at this time. Will trial low dose morphine and allow him time to rest.   All questions/concerns addressed. Emotional support provided. Discussed with RN and PCCM NP Steve [name].   Exam: Alert, seems oriented. Thin, frail. Vent to trach but with labored breathing using accessory muscles and tachypneic. Abd soft.   Plan: - Trial morphine 2.5 mg via tube every 3 hours as needed for shortness of breath. Followed up with RN and seemed to have some relief with morphine.  - DNR. Treat the treatable.   [place],  NP Palliative Medicine Team Pager [phone] (Please see [email or web address] for schedule) Team Phone [phone]    Greater than 50%  of this time was spent counseling and coordinating care related to the above assessment and plan

## 2020-11-11 NOTE — Progress Notes (Addendum)
NAME:  Steve [name], MRN:  [id number], DOB:  1956-01-18, LOS: 2 ADMISSION DATE:  11/13/2020, CONSULTATION DATE:  8/1 REFERRING MD:  Dr. Colin Mulders, CHIEF COMPLAINT:  Increased vent use   BRIEF  65 y/o M who presented to [facility] ER on 8/1 with reports of increased vent use over the preceding 4 days and desaturations.    The patient is followed by Dr. Marin Olp for Hodgin's Lymphoma and was deemed not a candidate for further treatment due to his functional status. He was last reviewed on 11/03/20 with plan for follow up in 4 weeks to reassess functional status. He has had a prolonged chronic critical illness since March of 2021 - including lymphoma, COVID, ARDS thought multifactorial in the setting of COVID and possible pneumonitis from immunotherapy, chronic trach/vent, multiple chest tubes, malnutrition, bacteremia, frequent readmissions and failure to thrive.   He was recently admitted from 6/11 - 7/18 for poor appetite and malaise.  Work up at that time found him to be positive for staph epidermidis bacteremia.  He was seen by ID with a discharge plan of 6 weeks of IV antibiotics (first negative blood culture 09/22/20).    His wife reports the patient has been requiring the home vent consistently over the last four days.  He previously had been on the ATC during the day times.  He has home health that come in to assist in his care 2x per week.  His wife and the home care RN noted he used the urinal on 8/1 and desaturated into 80's. They re-checked the sats via home health RN's meter and they were in the 50's.  EMS was activated. On ER arrival, he was noted to be somnolent on the vent and lethargic.  ABG evaluation pending. Most recent ABG in July 7.318 / 88.   CXR review showed known severe diffuse bilateral airspace disease, moderate right pleural effusion. Wife reports he has not had his midodrine until he got to the ER.  Initial labs notable for CO2 of 42, glucose 172, BUN 28 / Sr Cr 0.85 (up from 0.5),  albumin 2.1, lactic acid 4.1, WBC 26.6, Hgb 9.4, and platelets 306.    PCCM consulted for evaluation of hypoxic respiratory failure.   Pertinent  Medical History  Hodgkin's Lymphoma COVID with ARDS - 2021 s/p vent trach  Hx Pneumothorax, Loculated Effusion GERD HTN  Pancreatitis   Significant Hospital Events: Including procedures, antibiotic start and stop dates in addition to other pertinent events   8/1 Admit with AMS -> cuff leak with size 6 trach with distal end was 7cm above carina and stoma too big -> chagned to 6 XLT and distal end 4cm above carina and less leak  Interim History / Subjective:   8/2 -better ventilator mechanics after change in tracheostomy to size 6 XLT.  He is currently on 60% FiO2 and 5 of PEEP.  He came off Levophed but then blood pressure dropped and he is getting fluid bolus now.  Nurses that he generally runs lower level of blood pressure.  Also anxiety.  Noted to be on Ativan at home.  He just got a dose and he is more comfortable  Objective   Blood pressure (!) 146/80, pulse (!) 110, temperature 99.1 F (37.3 C), temperature source Oral, resp. rate (!) 27, height 5\' 5"  (1.651 m), weight 64.9 kg, SpO2 (!) 89 %.    Vent Mode: PRVC FiO2 (%):  [55 %-60 %] 60 % Set Rate:  [14 bmp] 14 bmp  Vt Set:  [490 mL] 490 mL PEEP:  [5 cmH20] 5 cmH20 Plateau Pressure:  [25 cmH20-30 cmH20] 25 cmH20   Intake/Output Summary (Last 24 hours) at 11/11/2020 0925 Last data filed at 11/11/2020 0630 Gross per 24 hour  Intake 2086.96 ml  Output 1450 ml  Net 636.96 ml   Filed Weights   12/06/2020 1122 11/10/20 0500 11/11/20 0400  Weight: 63.9 kg 67.7 kg 64.9 kg    Examination: General Appearance:  debilitated, deconditioned, on vent Head:  Normocephalic, without obvious abnormality, atraumatic Eyes:  PERRL - yes, conjunctiva/corneas - mudd     Ears:  Normal external ear canals, both ears Nose:  G tube - no Throat:  ETT TUBE - no , OG tube - no. Size 6XLT Trach Neck:   Supple,  No enlargement/tenderness/nodules Lungs: Clear to auscultation bilaterally, Ventilator   Synchrony - yes, 60% fio2, peep 5 Heart:  S1 and S2 normal, no murmur, CVP - no.  Pressors - no Abdomen:  Soft, no masses, no organomegaly Genitalia / Rectal:  Not done Extremities:  Extremities- intact Skin:  ntact in exposed areas . Sacral area - not examined Neurologic:  Sedation - ativan prn -> RASS - -1 . Moves all 4s - yes. CAM-ICU - cannot test . Orientation - cannot test    LABS    PULMONARY Recent Labs  Lab 11/14/2020 1454  PHART 7.403  PCO2ART 68.1*  PO2ART 75.0*  HCO3 42.4*  O2SAT 96.1    CBC Recent Labs  Lab 12/09/2020 1703 11/10/20 0530 11/11/20 0330  HGB 8.1* 7.9* 7.6*  HCT 27.3* 26.4* 24.8*  WBC 20.7* 25.3* 19.7*  PLT 235 288 284    COAGULATION Recent Labs  Lab 11/20/2020 1210  INR 1.4*    CARDIAC  No results for input(s): TROPONINI in the last 168 hours. No results for input(s): PROBNP in the last 168 hours.   CHEMISTRY Recent Labs  Lab 11/10/2020 1210 11/10/20 0530 11/11/20 0330  NA 138 139 138  K 4.2 4.4 3.8  CL 90* 94* 93*  CO2 42* 37* 38*  GLUCOSE 172* 187* 200*  BUN 28* 38* 40*  CREATININE 0.85 0.99 0.94  CALCIUM 11.8* 11.5* 10.8*  MG  --  2.2 2.1  PHOS  --  3.3 3.1   Estimated Creatinine Clearance: 68.2 mL/min (by C-G formula based on SCr of 0.94 mg/dL).   LIVER Recent Labs  Lab 12/06/2020 1210  AST 16  ALT 20  ALKPHOS 70  BILITOT 0.4  PROT 6.1*  ALBUMIN 2.1*  INR 1.4*     INFECTIOUS Recent Labs  Lab 11/29/2020 1210 11/15/2020 1433 11/10/20 0530 11/11/20 0330  LATICACIDVEN 4.1* 1.2  --   --   PROCALCITON 0.44  --  0.30 0.30     ENDOCRINE CBG (last 3)  Recent Labs    11/10/20 2332 11/11/20 0330 11/11/20 0733  GLUCAP 186* 189* 214*         IMAGING x48h  - image(s) personally visualized  -   highlighted in bold DG Chest 1 View  Result Date: 11/10/2020 CLINICAL DATA:  Pneumonia. EXAM: CHEST  1 VIEW  COMPARISON:  11/29/2020. FINDINGS: Endotracheal tube and right PICC line stable position. Severe cardiomegaly again noted. Diffuse bilateral pulmonary infiltrates and moderate right pleural effusion again noted. Surgical clips right upper chest. Degenerative change thoracic spine. IMPRESSION: 1.  Tracheostomy tube and right PICC line stable position. 2.  Severe cardiomegaly again noted. 3. Diffuse bilateral pulmonary infiltrates and moderate right-sided pleural effusion again  noted. Similar findings noted on prior chest x-ray. Electronically Signed   By: Marcello Moores  Register   On: 11/10/2020 07:11   DG Abd 1 View  Result Date: 11/29/2020 CLINICAL DATA:  Ileus EXAM: ABDOMEN - 1 VIEW COMPARISON:  09/24/2019 FINDINGS: Two supine frontal views of the abdomen and pelvis are obtained. Percutaneous gastrostomy tube overlies left upper quadrant. No bowel obstruction or ileus. Gas is seen throughout the small and large bowel to the level of the rectum. No masses or abnormal calcifications. Bibasilar airspace disease and right pleural effusion again noted. IMPRESSION: 1. Unremarkable bowel gas pattern. 2. Stable bilateral airspace disease and right pleural effusion. Electronically Signed   By: Randa Ngo M.D.   On: 12/05/2020 15:06   DG Chest Portable 1 View  Result Date: 12/03/2020 CLINICAL DATA:  Shortness of breath EXAM: PORTABLE CHEST 1 VIEW COMPARISON:  09/24/2020 FINDINGS: Severe diffuse bilateral airspace disease. Moderate right pleural effusion peripherally around the right lung. There is cardiomegaly. Tracheostomy is un changed. Right PICC line in place with the tip at the cavoatrial junction. IMPRESSION: Severe diffuse bilateral airspace disease, not significantly changed since prior study. Moderate right pleural effusion. Electronically Signed   By: Rolm Baptise M.D.   On: 12/09/2020 11:49     Resolved Hospital Problem list   Laactic acidosis 11/17/2020 at admit - resolved 8/2  Assessment & Plan:      Acute on chronic hypoxic and hypercarbic respiratory failure requiring mechanical ventilation Tracheostomy status - exchanged 8/2 for shiley 6 XLT  Severe post-covid pulmonary fibrosis HCAP Possible tracheobronchitis Chronic loculated R pleural effusion Plan - cont vent support, wean as able  -VAP, pulm hygiene  -cefepime as below  -PRN CXR   Sepsis in setting of HCAP above -recent staph epi bactermia s/p 6wk course abx on 7/26  -trach asp 8/1 with few GNRs  -strp pneumo Uag neg 8/2 Plan -cefepime   Chronic hypotension Shock, improved  Plan -continue midodrine  -off NE, will dc  -mIVF as   AKI, improving Cr  Plan -mIVF (LR @ 38ml/hr x 20hr) -trend renal indices qD   Anemia of chronic disease  P -Trend CBC qD  -hgb goal >7   DM2 with hyperglycemia  Plan -mSSI  Hodgkin's lymphoma, relapsed Plan -followed by Dr. Marin Olp -not a candidate for further therapy   Severe protein calorie malnutrition FTT  Plan -EN via PEG   Depression Anxiety Plan -zoloft, ativan -- home meds  Pruritis Plan -PRN atarax, calamine lotion  DNR Status Encounter for palliative care -8/3 I Shirlee Limerick CCM) approached the discussion of GOC/palliative care w pt. Dearion feels tired of these recurrent hospitalizations and does not want to keep doing this (it was unclear to me even with further questions if he meant he does not want to continue current model of care, or if he is simply frustrated by the recurrent admissions) -Green has a good understanding of how tenuous his baseline health status is, and that he is at risk for significant decompensation and possibly death  P -DNR -continuing to treat the treatable  -Continue [place] conversations, palliative consult   Chronic conditions present on admit -chronic hypoxic hypercarbic respiratory failure -tracheostomy status, VDRF  -post-covid pulmonary fibrosis (UIP) -Hodgkin's lymphoma with relapse, without further tx options -anemia  of chronic dz -severe protein calorie malnutrition, cachexia, FTT  -mood disorder: depression, anxiety  -chronic hypotension on midodrine -soft tissue pressure injury -DM2 -DNR status   Best Practice (right click and "Reselect all SmartList Selections"  daily)  Diet/type: tubefeeds DVT prophylaxis: prophylactic heparin  GI prophylaxis: PPI Lines: N/A Foley:  N/A Code Status:  DNR Last date of multidisciplinary goals of care discussion: DNR (POA).     CRITICAL CARE Performed by: Cristal Generous  Total critical care time: 48 minutes  Critical care time was exclusive of separately billable procedures and treating other patients. Critical care was necessary to treat or prevent imminent or life-threatening deterioration.  Critical care was time spent personally by me on the following activities: development of treatment plan with patient and/or surrogate as well as nursing, discussions with consultants, evaluation of patient's response to treatment, examination of patient, obtaining history from patient or surrogate, ordering and performing treatments and interventions, ordering and review of laboratory studies, ordering and review of radiographic studies, pulse oximetry and re-evaluation of patient's condition.   Eliseo Gum MSN, AGACNP-BC [place] for pager  11/11/2020, 9:25 AM  Xxxxx  ATTESTATION & SIGNATURE   STAFF NOTE: I, Dr Ann Lions have personally reviewed patient's available data, including medical history, events of note, physical examination and test results as part of my evaluation. I have discussed with resident/NP and other care providers such as pharmacist, RN and RRT.  In addition,  I personally evaluated patient and elicited key findings of   S: Date of admit 11/27/2020 with LOS 2 for today 11/11/2020 : [name] is -continues on ventilator with 6 XLT tracheostomy.  RN reports anxiety for which lorazepam has helped.  60%  FiO2.  Periodically coughs and triggers the ventilator peak pressures.  O:  Blood pressure (!) 119/59, pulse (!) 103, temperature 99.7 F (37.6 C), temperature source Oral, resp. rate (!) 37, height 5\' 5"  (1.651 m), weight 64.9 kg, SpO2 (!) 89 %.   Coughing episode with triggering of peak pressure on the ventilator.  Has crackles.  Looks emaciated deconditioning failure to thrive.  Abdomen soft     A: Chronic respiratory failure on account of pulmonary fibrosis in the setting of post-COVID and other medical issues including failure to thrive and protein calorie malnutrition and recurrent VAP  DNR at admission  P: Continue goals of care poor prognosis. Continue ventilator support and antibiotics Control anxiety   Rest per NP/medical resident whose note is outlined above and that I agree with  The patient is critically ill with multiple organ systems failure and requires high complexity decision making for assessment and support, frequent evaluation and titration of therapies, application of advanced monitoring technologies and extensive interpretation of multiple databases.   Critical Care Time devoted to patient care services described in this note is  10  Minutes. This time reflects time of care of this signee Dr Brand Males. This critical care time does not reflect procedure time, or teaching time or supervisory time of PA/NP/Med student/Med Resident etc but could involve care discussion time     Dr. Brand Males, M.D., [facility].C.P Pulmonary and Critical Care Medicine Staff Physician [place] [facility] Pager: [phone], If no answer or between  15:00h - 7:00h: call [phone]  11/11/2020 11:46 AM

## 2020-11-12 ENCOUNTER — Inpatient Hospital Stay: Payer: BC Managed Care – PPO | Admitting: Infectious Disease

## 2020-11-12 DIAGNOSIS — R6520 Severe sepsis without septic shock: Secondary | ICD-10-CM

## 2020-11-12 DIAGNOSIS — J151 Pneumonia due to Pseudomonas: Secondary | ICD-10-CM | POA: Diagnosis not present

## 2020-11-12 DIAGNOSIS — A4152 Sepsis due to Pseudomonas: Secondary | ICD-10-CM | POA: Diagnosis not present

## 2020-11-12 DIAGNOSIS — J9621 Acute and chronic respiratory failure with hypoxia: Secondary | ICD-10-CM | POA: Diagnosis not present

## 2020-11-12 LAB — GLUCOSE, CAPILLARY
Glucose-Capillary: 162 mg/dL — ABNORMAL HIGH (ref 70–99)
Glucose-Capillary: 173 mg/dL — ABNORMAL HIGH (ref 70–99)
Glucose-Capillary: 123 mg/dL — ABNORMAL HIGH (ref 70–99)
Glucose-Capillary: 131 mg/dL — ABNORMAL HIGH (ref 70–99)
Glucose-Capillary: 138 mg/dL — ABNORMAL HIGH (ref 70–99)
Glucose-Capillary: 171 mg/dL — ABNORMAL HIGH (ref 70–99)

## 2020-11-12 LAB — CULTURE, RESPIRATORY W GRAM STAIN

## 2020-11-12 LAB — BASIC METABOLIC PANEL
Anion gap: 8 (ref 5–15)
BUN: 41 mg/dL — ABNORMAL HIGH (ref 8–23)
CO2: 38 mmol/L — ABNORMAL HIGH (ref 22–32)
Calcium: 10.9 mg/dL — ABNORMAL HIGH (ref 8.9–10.3)
Chloride: 97 mmol/L — ABNORMAL LOW (ref 98–111)
Creatinine, Ser: 0.93 mg/dL (ref 0.61–1.24)
GFR, Estimated: >60 mL/min (ref >60)
Glucose, Bld: 184 mg/dL — ABNORMAL HIGH (ref 70–99)
Potassium: 4.1 mmol/L (ref 3.5–5.1)
Sodium: 143 mmol/L (ref 135–145)

## 2020-11-12 LAB — CBC
HCT: 25.7 % — ABNORMAL LOW (ref 39.0–52.0)
Hemoglobin: 7.5 g/dL — ABNORMAL LOW (ref 13.0–17.0)
MCH: 29.1 pg (ref 26.0–34.0)
MCHC: 29.2 g/dL — ABNORMAL LOW (ref 30.0–36.0)
MCV: 99.6 fL (ref 80.0–100.0)
Platelets: 243 10*3/uL (ref 150–400)
RBC: 2.58 MIL/uL — ABNORMAL LOW (ref 4.22–5.81)
RDW: 15.9 % — ABNORMAL HIGH (ref 11.5–15.5)
WBC: 19.3 10*3/uL — ABNORMAL HIGH (ref 4.0–10.5)
nRBC: 0 % (ref 0.0–0.2)

## 2020-11-12 LAB — MAGNESIUM: Magnesium: 2.4 mg/dL (ref 1.7–2.4)

## 2020-11-12 LAB — PHOSPHORUS: Phosphorus: 4.3 mg/dL (ref 2.5–4.6)

## 2020-11-12 MED ORDER — CHLORHEXIDINE GLUCONATE 0.12 % MT SOLN
OROMUCOSAL | Status: AC
  Administered 2020-11-12: 15 mL via OROMUCOSAL
  Filled 2020-11-12: qty 15

## 2020-11-12 MED ORDER — ORAL CARE MOUTH RINSE
15.0000 mL | OROMUCOSAL | Status: DC
  Administered 2020-11-12 – 2020-11-18 (×57): 15 mL via OROMUCOSAL

## 2020-11-12 MED ORDER — CHLORHEXIDINE GLUCONATE 0.12% ORAL RINSE (MEDLINE KIT)
15.0000 mL | Freq: Two times a day (BID) | OROMUCOSAL | Status: DC
  Administered 2020-11-13 – 2020-11-18 (×10): 15 mL via OROMUCOSAL

## 2020-11-12 MED ORDER — MORPHINE SULFATE 10 MG/5ML PO SOLN
2.5000 mg | ORAL | Status: DC | PRN
  Administered 2020-11-12: 5 mg
  Filled 2020-11-12: qty 5

## 2020-11-12 MED ORDER — SODIUM CHLORIDE 0.9 % IV SOLN
2.0000 g | Freq: Three times a day (TID) | INTRAVENOUS | Status: DC
  Administered 2020-11-12 – 2020-11-17 (×15): 2 g via INTRAVENOUS
  Filled 2020-11-12 (×18): qty 2

## 2020-11-12 MED ORDER — MORPHINE SULFATE 10 MG/5ML PO SOLN
2.5000 mg | ORAL | Status: DC | PRN
  Administered 2020-11-13 – 2020-11-14 (×4): 5 mg
  Filled 2020-11-12 (×4): qty 5

## 2020-11-12 MED ORDER — MORPHINE SULFATE 10 MG/5ML PO SOLN: 2.5000 mg | ORAL | Status: DC | PRN

## 2020-11-12 NOTE — Progress Notes (Signed)
Patient was not placed in wean mode this morning due to the fact that he was lethargic, had increased WOB. Patient was suctioned and inner cannula, ties, and sponge changed and cleaned

## 2020-11-12 NOTE — Progress Notes (Addendum)
Pharmacy Antibiotic Note  Steve [name] is a 65 y.o. male admitted on 11/21/2020 with pneumonia.  Pharmacy has been consulted for cefepime dosing.  Wbc 19, Tm 99.7  Plan: Continue cefepime 2g IV q8h Monitor renal function, clinical status, and antibiotic plan  Height: 5\' 5"  (165.1 cm) Weight: 66.5 kg (146 lb 9.7 oz) IBW/kg (Calculated) : 61.5  Temp (24hrs), Avg:98.8 F (37.1 C), Min:96.7 F (35.9 C), Max:99.7 F (37.6 C)  Recent Labs  Lab 12/06/2020 1130 11/19/2020 1210 11/25/2020 1433 11/17/2020 1703 11/10/20 0530 11/11/20 0330 11/12/20 0435  WBC 26.6*  --   --  20.7* 25.3* 19.7* 19.3*  CREATININE  --  0.85  --   --  0.99 0.94 0.93  LATICACIDVEN  --  4.1* 1.2  --   --   --   --      Estimated Creatinine Clearance: 68.9 mL/min (by C-G formula based on SCr of 0.93 mg/dL).    No Known Allergies  Antimicrobials this admission: Vanc 8/1 >> 8/2 Cefepime 8/1 >>  Zosyn x1  Dose adjustments this admission: N/A  Microbiology results: 8/1 BCx: ngtd 8/1 Rcx: pseudomonas aeruginosa (prev Rcx w/ pseudomonas in Feb 2022 S ceftazidime, imipenem, cipro, and gent)  Thank you for allowing pharmacy to be a part of this patient's care.  Dimple Nanas, PharmD 11/12/2020 7:40 AM  PM UPDATE: Sensitivities returned for Pseudomonas from the trach aspirate.  S to ceftazidime, imipenem, ciprofloxacin, and gentamicin.  Discussed with ID pharmacist- will switch to meropenem as patient may be resistant to cefepime and zosyn since these sensitivities were not reported.   PLAN: -Discontinue cefepime -Start meropenem 2g IV q8 hours  -Monitor clinical improvement, antibiotic LOT, renal function  Dimple Nanas, PharmD 11/12/2020 1:35 PM

## 2020-11-12 NOTE — Progress Notes (Signed)
Palliative:  HPI: 65 y.o. 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 Trach exchanged for shiley 6 XLT with some improvement in oxygenation.     I met today at [facility] bedside and RN present. Dominica Severin (and RN) report good relief with low dose morphine. He does nod his head yes to pain but unclear where his pain is located. Possibly could be related to his itching in which many creams and atarax have been tried with limited relief. Could consider some very low dose benadryl at nighttime but would worry about potential over sedation so will not order at this time.  Jeanpierre confirms that he is hanging in there and pleased with his care. No changes to his goals of care at this time. I know his wife is working and since no acute changes or concerns I will not call her today. I would like to ensure that she has information to connect with [facility] at some point.   All questions/concerns addressed. Emotional support provided.   Exam: Alert, seems oriented. Thin, frail. Vent to trach but with labored breathing using accessory muscles and tachypneic. Abd soft.  Plan: - Continue morphine 2.5 mg via tube every 3 hours as needed for shortness of breath and increased range 2.5-5 mg with complaints of pain today. Followed up with RN and seemed to have some relief with morphine. - DNR. Treat the treatable.  15 min  Vinie Sill, NP Palliative Medicine Team Pager [phone] (Please see [email or web address] for schedule) Team Phone [phone]    Greater than 50%  of this time was spent counseling and coordinating care related to the above  assessment and plan

## 2020-11-12 NOTE — Progress Notes (Addendum)
NAME:  Steve [name], MRN:  [id number], DOB:  01-29-1956, LOS: 3 ADMISSION DATE:  11/23/2020, CONSULTATION DATE:  8/1 REFERRING MD:  Dr. Colin Mulders, CHIEF COMPLAINT:  Increased vent use   BRIEF  65 y/o M who presented to [facility] ER on 8/1 with reports of increased vent use over the preceding 4 days and desaturations.    The patient is followed by Dr. Marin Olp for Hodgin's Lymphoma and was deemed not a candidate for further treatment due to his functional status. He was last reviewed on 11/03/20 with plan for follow up in 4 weeks to reassess functional status. He has had a prolonged chronic critical illness since March of 2021 - including lymphoma, COVID, ARDS thought multifactorial in the setting of COVID and possible pneumonitis from immunotherapy, chronic trach/vent, multiple chest tubes, malnutrition, bacteremia, frequent readmissions and failure to thrive.   He was recently admitted from 6/11 - 7/18 for poor appetite and malaise.  Work up at that time found him to be positive for staph epidermidis bacteremia.  He was seen by ID with a discharge plan of 6 weeks of IV antibiotics (first negative blood culture 09/22/20).    His wife reports the patient has been requiring the home vent consistently over the last four days.  He previously had been on the ATC during the day times.  He has home health that come in to assist in his care 2x per week.  His wife and the home care RN noted he used the urinal on 8/1 and desaturated into 80's. They re-checked the sats via home health RN's meter and they were in the 50's.  EMS was activated. On ER arrival, he was noted to be somnolent on the vent and lethargic.  ABG evaluation pending. Most recent ABG in July 7.318 / 88.   CXR review showed known severe diffuse bilateral airspace disease, moderate right pleural effusion. Wife reports he has not had his midodrine until he got to the ER.  Initial labs notable for CO2 of 42, glucose 172, BUN 28 / Sr Cr 0.85 (up from 0.5),  albumin 2.1, lactic acid 4.1, WBC 26.6, Hgb 9.4, and platelets 306.    PCCM consulted for evaluation of hypoxic respiratory failure.   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 Down to60% PEEP 10   Interim History / Subjective:   NAEO. Started a little PRN morphine yesterday  Decr FiO2 to 60 this morning   BP a bit soft   Objective   Blood pressure (!) 94/45, pulse 78, temperature 98.7 F (37.1 C), temperature source Axillary, resp. rate (!) 26, height 5\' 5"  (1.651 m), weight 66.5 kg, SpO2 90 %.    Vent Mode: PRVC FiO2 (%):  [60 %-70 %] 65 % Set Rate:  [14 bmp] 14 bmp Vt Set:  [490 mL] 490 mL PEEP:  [5 cmH20-10 cmH20] 10 cmH20 Pressure Support:  [20 cmH20] 20 cmH20 Plateau Pressure:  [33 cmH20] 33 cmH20   Intake/Output Summary (Last 24 hours) at 11/12/2020 0936 Last data filed at 11/12/2020 0800 Gross per  24 hour  Intake 1677.58 ml  Output 2000 ml  Net -322.42 ml   Filed Weights   11/10/20 0500 11/11/20 0400 11/12/20 0437  Weight: 67.7 kg 64.9 kg 66.5 kg    Examination: General: acutely and chronically ill appearing. Appears older than stated age. Trach/vent  HEENt:  NCAt Trach secure. Moderate tan secretions Lungs: Diffuse rhonchi. Symemtrical chest expansion. Incr RR  Heart:  rrr s1s2 cap refill < 3sec Abdomen:  soft round ndnt  GU: defer Extremities: no acute deformity no cyanosis or clubbing  Skin: pale, c/d/w. Scattered erythema where patient has scratched  Neurologic: Lethargic, awakens to voice following commands. Interactive     LABS     PULMONARY Recent Labs  Lab 11/21/2020 1454  PHART 7.403  PCO2ART 68.1*  PO2ART 75.0*  HCO3 42.4*  O2SAT 96.1    CBC Recent Labs  Lab 11/10/20 0530 11/11/20 0330 11/12/20 0435  HGB 7.9* 7.6* 7.5*  HCT 26.4* 24.8* 25.7*  WBC 25.3* 19.7* 19.3*  PLT 288 284 243    COAGULATION Recent Labs  Lab 11/26/2020 1210  INR 1.4*    CARDIAC  No results for input(s): TROPONINI in the last 168 hours. No results for input(s): PROBNP in the last 168 hours.   CHEMISTRY Recent Labs  Lab 11/29/2020 1210 11/10/20 0530 11/11/20 0330 11/12/20 0435  NA 138 139 138 143  K 4.2 4.4 3.8 4.1  CL 90* 94* 93* 97*  CO2 42* 37* 38* 38*  GLUCOSE 172* 187* 200* 184*  BUN 28* 38* 40* 41*  CREATININE 0.85 0.99 0.94 0.93  CALCIUM 11.8* 11.5* 10.8* 10.9*  MG  --  2.2 2.1 2.4  PHOS  --  3.3 3.1 4.3   Estimated Creatinine Clearance: 68.9 mL/min (by C-G formula based on SCr of 0.93 mg/dL).   LIVER Recent Labs  Lab 11/26/2020 1210  AST 16  ALT 20  ALKPHOS 70  BILITOT 0.4  PROT 6.1*  ALBUMIN 2.1*  INR 1.4*     INFECTIOUS Recent Labs  Lab 11/15/2020 1210 11/12/2020 1433 11/10/20 0530 11/11/20 0330  LATICACIDVEN 4.1* 1.2  --   --   PROCALCITON 0.44  --  0.30 0.30     ENDOCRINE CBG (last 3)  Recent Labs    11/11/20 2324 11/12/20 0310 11/12/20 0729  GLUCAP 141* 162* 173*    IMAGING x48h  - image(s) personally visualized  -   highlighted in bold No results found.   Resolved Hospital Problem list   Laactic acidosis 11/27/2020 at admit - resolved 8/2  Assessment & Plan:    Acute on chronic hypoxic and hypercarbic respiratory failure Tracheostomy status  Severe post-covid pulm fibrosis HCAP, possible tracheobronchitis Chronic R pleural effusion  Plan - cont vent support, wean as able. Goal ATC but we are a ways away still  -VAP, pulm hygiene  -cefepime -PRN CXR  -6XLT trach  Severe sepsis due to HCAP (pseudomonas aeruginosa) -trach asp 8/1 with few PsA. There was no  Pseudomonas on 5/22 resp cx  -strp pneumo Uag neg 8/2 -finished abx course 7/26 for staph epi bacteremia Plan -cefepime -- awaiting susceptibilities   Chronic hypotension Plan -continue midodrine  -mIVF   AKI, improving  Plan -mIVF (LR @ 62ml/hr x 20hr) -trend renal indices qD   Anemia of chronic disease  P -Trend CBC qD  -hgb goal >7   DM2 with hyperglycemia  Plan -mSSI  Hodgkin's lymphoma, relapsed Plan -followed by Dr. Marin Olp -not a candidate for further therapy  FTT Severe protein calorie malnutrition  Plan -EN via PEG   Depression Anxiety Plan -zoloft, ativan -- home meds  Pruritis Plan -PRN atarax, benadryl cream   DNR status Encounter for palliative care  -DNR on admission -Abner has a good understanding of how tenuous his baseline health status is, and that he is at risk for significant decompensation and possibly death  P -DNR -Continue [place] conversations, palliative consult -started a low dose PRN morphine to help w some resp sx 8/3 w Palli while GOC continue and while we cont to tx treatable aggressively   Chronic conditions present on admit -chronic hypoxic hypercarbic respiratory failure -tracheostomy status, VDRF  -post-covid pulmonary fibrosis (UIP) -Hodgkin's lymphoma with relapse, without further tx options -anemia of chronic dz -severe protein calorie malnutrition, cachexia, FTT  -mood disorder: depression, anxiety  -chronic hypotension on midodrine -soft tissue pressure injury -DM2 -DNR status   Best Practice (right click and "Reselect all SmartList Selections" daily)  Diet/type: tubefeeds DVT prophylaxis: prophylactic heparin  GI prophylaxis: PPI Lines: N/A Foley:  N/A Code Status:  DNR Last date of multidisciplinary goals of care discussion: DNR (POA).   CRITICAL CARE Performed by: Cristal Generous   Total critical care time: 39 minutes  Critical care time was exclusive of separately billable procedures and treating  other patients. Critical care was necessary to treat or prevent imminent or life-threatening deterioration.  Critical care was time spent personally by me on the following activities: development of treatment plan with patient and/or surrogate as well as nursing, discussions with consultants, evaluation of patient's response to treatment, examination of patient, obtaining history from patient or surrogate, ordering and performing treatments and interventions, ordering and review of laboratory studies, ordering and review of radiographic studies, pulse oximetry and re-evaluation of patient's condition.  Eliseo Gum MSN, AGACNP-BC [place] for pager  11/12/2020, 9:36 AM   Xxxxxx  ATTESTATION & SIGNATURE   STAFF NOTE: I, Dr Ann Lions have personally reviewed patient's available data, including medical history, events of note, physical examination and test results as part of my evaluation. I have discussed with resident/NP and other care providers such as pharmacist, RN and RRT.  In addition,  I personally evaluated patient and elicited key findings of   S: Date of admit 11/17/2020 with LOS 3 for today 11/12/2020 : [name] is  -continues to be on ventilator via tracheostomy 6 XLT.  65% FiO2 and 10 of PEEP.  Palliative care is talking to him.  O:  Blood pressure (!) 94/45, pulse 78, temperature 98.7 F (37.1 C), temperature source Axillary, resp. rate (!) 26, height 5\' 5"  (1.651 m), weight 66.5 kg, SpO2 90 %.   Deconditioned, failure to thrive, tracheostomy connected to the ventilator.  Drowsy.  Not answering palliative care questions currently.   A: Severe pulmonary fibrosis FiO2 60%/PEEP of 10 ARDS physiology chronic respiratory failure Advanced lymphoma Failure to thrive Recurrent sepsis  P: Continue full ICU care Very poor prognosis     Rest per NP/medical resident whose note is outlined above and that I agree with  The patient is  critically ill with multiple organ systems failure and requires high complexity decision making for assessment and support, frequent evaluation and titration of therapies, application of advanced monitoring technologies and extensive interpretation of multiple databases.   Critical Care Time devoted to patient care services described in this note is  15  Minutes. This time reflects time of care of this signee Dr Belva Crome  Andrina Locken. This critical care time does not reflect procedure time, or teaching time or supervisory time of PA/NP/Med student/Med Resident etc but could involve care discussion time     Dr. Brand Males, M.D., [facility].C.P Pulmonary and Critical Care Medicine Staff Physician [place] [facility] Pager: [phone], If no answer or between  15:00h - 7:00h: call [phone]  11/12/2020 10:31 AM

## 2020-11-13 ENCOUNTER — Inpatient Hospital Stay (HOSPITAL_COMMUNITY): Payer: BC Managed Care – PPO

## 2020-11-13 DIAGNOSIS — J151 Pneumonia due to Pseudomonas: Secondary | ICD-10-CM | POA: Diagnosis not present

## 2020-11-13 DIAGNOSIS — J9621 Acute and chronic respiratory failure with hypoxia: Secondary | ICD-10-CM | POA: Diagnosis not present

## 2020-11-13 DIAGNOSIS — Z66 Do not resuscitate: Secondary | ICD-10-CM | POA: Diagnosis not present

## 2020-11-13 DIAGNOSIS — A419 Sepsis, unspecified organism: Secondary | ICD-10-CM | POA: Diagnosis not present

## 2020-11-13 LAB — CBC
HCT: 27 % — ABNORMAL LOW (ref 39.0–52.0)
Hemoglobin: 7.5 g/dL — ABNORMAL LOW (ref 13.0–17.0)
MCH: 28.4 pg (ref 26.0–34.0)
MCHC: 27.8 g/dL — ABNORMAL LOW (ref 30.0–36.0)
MCV: 102.3 fL — ABNORMAL HIGH (ref 80.0–100.0)
Platelets: 264 K/uL (ref 150–400)
RBC: 2.64 MIL/uL — ABNORMAL LOW (ref 4.22–5.81)
RDW: 16.3 % — ABNORMAL HIGH (ref 11.5–15.5)
WBC: 20.7 K/uL — ABNORMAL HIGH (ref 4.0–10.5)
nRBC: 0 % (ref 0.0–0.2)

## 2020-11-13 LAB — GLUCOSE, CAPILLARY
Glucose-Capillary: 129 mg/dL — ABNORMAL HIGH (ref 70–99)
Glucose-Capillary: 203 mg/dL — ABNORMAL HIGH (ref 70–99)
Glucose-Capillary: 180 mg/dL — ABNORMAL HIGH (ref 70–99)
Glucose-Capillary: 146 mg/dL — ABNORMAL HIGH (ref 70–99)
Glucose-Capillary: 135 mg/dL — ABNORMAL HIGH (ref 70–99)
Glucose-Capillary: 169 mg/dL — ABNORMAL HIGH (ref 70–99)

## 2020-11-13 LAB — BASIC METABOLIC PANEL
Anion gap: 4 — ABNORMAL LOW (ref 5–15)
BUN: 47 mg/dL — ABNORMAL HIGH (ref 8–23)
CO2: 38 mmol/L — ABNORMAL HIGH (ref 22–32)
Calcium: 11.1 mg/dL — ABNORMAL HIGH (ref 8.9–10.3)
Chloride: 104 mmol/L (ref 98–111)
Creatinine, Ser: 1.05 mg/dL (ref 0.61–1.24)
GFR, Estimated: >60 mL/min (ref >60)
Glucose, Bld: 161 mg/dL — ABNORMAL HIGH (ref 70–99)
Potassium: 4.5 mmol/L (ref 3.5–5.1)
Sodium: 146 mmol/L — ABNORMAL HIGH (ref 135–145)

## 2020-11-13 LAB — PHOSPHORUS: Phosphorus: 4.3 mg/dL (ref 2.5–4.6)

## 2020-11-13 LAB — MAGNESIUM: Magnesium: 2.5 mg/dL — ABNORMAL HIGH (ref 1.7–2.4)

## 2020-11-13 MED ORDER — DIPHENHYDRAMINE HCL 50 MG/ML IJ SOLN
25.0000 mg | Freq: Three times a day (TID) | INTRAMUSCULAR | Status: DC | PRN
  Administered 2020-11-13 – 2020-11-14 (×2): 25 mg via INTRAVENOUS
  Filled 2020-11-13 (×3): qty 1

## 2020-11-13 MED ORDER — MIDAZOLAM HCL (PF) 5 MG/ML IJ SOLN: 1.0000 mg | INTRAMUSCULAR | Status: DC | PRN

## 2020-11-13 MED ORDER — CHLORHEXIDINE GLUCONATE 0.12 % MT SOLN
OROMUCOSAL | Status: AC
  Administered 2020-11-13: 15 mL via OROMUCOSAL
  Filled 2020-11-13: qty 15

## 2020-11-13 MED ORDER — FAMOTIDINE IN NACL 20-0.9 MG/50ML-% IV SOLN
20.0000 mg | Freq: Two times a day (BID) | INTRAVENOUS | Status: AC
  Administered 2020-11-13 (×2): 20 mg via INTRAVENOUS
  Filled 2020-11-13 (×2): qty 50

## 2020-11-13 MED ORDER — MIDAZOLAM HCL 2 MG/2ML IJ SOLN
1.0000 mg | INTRAMUSCULAR | Status: DC | PRN
  Administered 2020-11-14: 2 mg via INTRAVENOUS
  Filled 2020-11-13: qty 2

## 2020-11-13 MED ORDER — FUROSEMIDE 10 MG/ML IJ SOLN
20.0000 mg | Freq: Once | INTRAMUSCULAR | Status: AC
  Administered 2020-11-13: 20 mg via INTRAVENOUS
  Filled 2020-11-13: qty 2

## 2020-11-13 MED ORDER — FENTANYL CITRATE (PF) 100 MCG/2ML IJ SOLN: 50.0000 ug | INTRAMUSCULAR | Status: DC | PRN

## 2020-11-13 NOTE — Progress Notes (Signed)
RT was called in the room due to pt keeps desaturating on 70% and having hard time breathing. FIO2 increased to 80% . Pt suctioned and lavaged moderated amount suctioned. O2 sats now 96%. RT will continue to monitor.

## 2020-11-13 NOTE — Progress Notes (Addendum)
NAME:  Steve [name], MRN:  [id number], DOB:  [birth year], LOS: 4 ADMISSION DATE:  11/25/2020, CONSULTATION DATE:  8/1 REFERRING MD:  Dr. Colin Mulders, CHIEF COMPLAINT:  Increased vent use   BRIEF  65 y/o M who presented to [facility] ER on 8/1 with reports of increased vent use over the preceding 4 days and desaturations.    The patient is followed by Dr. Marin Olp for Hodgin's Lymphoma and was deemed not a candidate for further treatment due to his functional status. He was last reviewed on 11/03/20 with plan for follow up in 4 weeks to reassess functional status. He has had a prolonged chronic critical illness since March of 2021 - including lymphoma, COVID, ARDS thought multifactorial in the setting of COVID and possible pneumonitis from immunotherapy, chronic trach/vent, multiple chest tubes, malnutrition, bacteremia, frequent readmissions and failure to thrive.   He was recently admitted from 6/11 - 7/18 for poor appetite and malaise.  Work up at that time found him to be positive for staph epidermidis bacteremia.  He was seen by ID with a discharge plan of 6 weeks of IV antibiotics (first negative blood culture 09/22/20).    His wife reports the patient has been requiring the home vent consistently over the last four days.  He previously had been on the ATC during the day times.  He has home health that come in to assist in his care 2x per week.  His wife and the home care RN noted he used the urinal on 8/1 and desaturated into 80's. They re-checked the sats via home health RN's meter and they were in the 50's.  EMS was activated. On ER arrival, he was noted to be somnolent on the vent and lethargic.  ABG evaluation pending. Most recent ABG in July 7.318 / 88.   CXR review showed known severe diffuse bilateral airspace disease, moderate right pleural effusion. Wife reports he has not had his midodrine until he got to the ER.  Initial labs notable for CO2 of 42, glucose 172, BUN 28 / Sr Cr 0.85 (up from 0.5),  albumin 2.1, lactic acid 4.1, WBC 26.6, Hgb 9.4, and platelets 306.    PCCM consulted for evaluation of hypoxic respiratory failure.   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 Down to60% PEEP 10   Interim History / Subjective:   NAEO I weaned his FiO2 to 70% (was 80%) Tried weaning to 60% and PEEP 8 which he tolerated ok, but then in scratching his face popped off vent and de-recruited, desat to mid 80s PEEP incr back to 10 and fio2 to 70   Objective   Blood pressure (!) 132/58, pulse 96, temperature 98.9 F (37.2 C), temperature source Axillary, resp. rate (!) 28, height 5\' 5"  (1.651 m), weight 68.3 kg, SpO2 96 %.    Vent Mode: PRVC FiO2 (%):  [70 %-100 %] 70 % Set Rate:  [14 bmp] 14 bmp Vt Set:  [490 mL] 490 mL PEEP:  [10 cmH20] 10 cmH20 Pressure Support:  [20 cmH20] 20 cmH20 Plateau Pressure:  [  24 cmH20-38 cmH20] 32 cmH20   Intake/Output Summary (Last 24 hours) at 11/13/2020 1006 Last data filed at 11/13/2020 0700 Gross per 24 hour  Intake 4079.16 ml  Output 1275 ml  Net 2804.16 ml   Filed Weights   11/11/20 0400 11/12/20 0437 11/13/20 0500  Weight: 64.9 kg 66.5 kg 68.3 kg    Examination: General: Acutely and chronically ill appearing M, appears older than stated age, trach/vent, reclined in bed NAD  HEENt:  NCAT trach secure anicteric sclera  Lungs: Bilat rhonchi. Symmetrical chest expansion. Mechanically ventilated  Heart:  s1s2 RRR cap refill brisk  Abdomen:  soft round ndnt  GU: external catheter  Extremities: no acute abnormality no cyanosis or  clubbing, no pitting edema  Skin: apale c/d/w. There are scattered dry scaly erythematous patches on face BUE (?dermatitis)  Neurologic: Lethargic, awakens to voice and follows commands    LABS    PULMONARY Recent Labs  Lab 12/09/2020 1454  PHART 7.403  PCO2ART 68.1*  PO2ART 75.0*  HCO3 42.4*  O2SAT 96.1    CBC Recent Labs  Lab 11/11/20 0330 11/12/20 0435 11/13/20 0500  HGB 7.6* 7.5* 7.5*  HCT 24.8* 25.7* 27.0*  WBC 19.7* 19.3* 20.7*  PLT 284 243 264    COAGULATION Recent Labs  Lab 11/27/2020 1210  INR 1.4*    CARDIAC  No results for input(s): TROPONINI in the last 168 hours. No results for input(s): PROBNP in the last 168 hours.   CHEMISTRY Recent Labs  Lab 12/02/2020 1210 11/10/20 0530 11/11/20 0330 11/12/20 0435 11/13/20 0500  NA 138 139 138 143 146*  K 4.2 4.4 3.8 4.1 4.5  CL 90* 94* 93* 97* 104  CO2 42* 37* 38* 38* 38*  GLUCOSE 172* 187* 200* 184* 161*  BUN 28* 38* 40* 41* 47*  CREATININE 0.85 0.99 0.94 0.93 1.05  CALCIUM 11.8* 11.5* 10.8* 10.9* 11.1*  MG  --  2.2 2.1 2.4 2.5*  PHOS  --  3.3 3.1 4.3 4.3   Estimated Creatinine Clearance: 61 mL/min (by C-G formula based on SCr of 1.05 mg/dL).   LIVER Recent Labs  Lab 11/30/2020 1210  AST 16  ALT 20  ALKPHOS 70  BILITOT 0.4  PROT 6.1*  ALBUMIN 2.1*  INR 1.4*     INFECTIOUS Recent Labs  Lab 11/23/2020 1210 12/06/2020 1433 11/10/20 0530 11/11/20 0330  LATICACIDVEN 4.1* 1.2  --   --   PROCALCITON 0.44  --  0.30 0.30     ENDOCRINE CBG (last 3)  Recent Labs    11/12/20 2325 11/13/20 0325 11/13/20 0809  GLUCAP 171* 129* 203*    IMAGING x48h  - image(s) personally visualized  -   highlighted in bold DG CHEST PORT 1 VIEW  Result Date: 11/13/2020 CLINICAL DATA:  Pneumonia. EXAM: PORTABLE CHEST 1 VIEW COMPARISON:  11/10/2020 FINDINGS: Extensive pleural and parenchymal densities throughout the right chest. Parenchymal densities or airspace disease in the right upper lung region may  have progressed. PICC line terminates in the SVC region and stable. Again noted is a tracheostomy tube. Diffuse interstitial densities throughout the left lung are unchanged. Cardiomediastinal silhouette remains enlarged with known bulky mediastinal lymphadenopathy. IMPRESSION: 1. Possible progression of the airspace disease or parenchymal disease in the right lung. Again noted is extensive parenchymal and pleural based disease throughout the right hemithorax. 2. Interstitial densities in left lung have not significantly changed. 3. Stable enlargement of the cardiomediastinal silhouette with known mediastinal lymphadenopathy. Electronically Signed   By: Quita Skye  Anselm Pancoast M.D.   On: 11/13/2020 08:08     Resolved Hospital Problem list   Lactic acidosis 12/05/2020 at admit - resolved 8/2 Shock  Assessment & Plan:   Acute on chronic hypoxic and hypercarbic respiratory failure Tracheostomy status Severe post covid pulmonary fibrosis Pseudomonas HCAP Possible tracheobronchitis Chronic R pleural effusion  -CXR 8/5 looks worse, more ASD on the R  Plan - cont vent support, wean as able  -VAP, pulm hygiene -mero  -PRN CXR  -6XLT trach  -PRN low dose morphine for resp sx   Severe sepsis due to Pseudomonas Aeruginosa HCAP -trach asp 8/1 with few PsA. There was no Pseudomonas on 5/22 resp cx  -finished abx course 7/26 for staph epi bacteremia Plan -mero  -as above Cxr unfortunately looks a bit worse 8/5  -trend fever curve WBC  Chronic hypotension  Plan -continue midodrine  -mIVF   AKI  Plan -mIVF (LR @ 62ml/hr x 20hr) -trend renal indices qD   Anemia of chronic disease  Plan -Trend CBC qD  -hgb goal >7   Dm2 with hyperglycemia  Plan -mSSI   Hodgkin's lymphoma, relapsed  Plan -Follow with Dr. Marin Olp -- unfortunately no further options   FTT Severe protein calorie malnutrition Plan -EN via PEG   Depression, Anxiety  Plan -zoloft, ativan -- home  meds  Dermatitis? Plan -Benadryl cream -PRN IV benadryl -[place] BID pepcid  -deferring adding steroids with severe sepsis   DNR Status Encounter for palliative care  -DNR on admission -Ernest has a good understanding of how tenuous his baseline health status is, and that he is at risk for significant decompensation and possibly death  P -DNR -Continue [place] conversations, palliative consult -started a low dose PRN morphine to help w some resp sx 8/3 w Palli while GOC continue and while we cont to tx treatable aggressively   Chronic conditions present on admit -chronic hypoxic hypercarbic respiratory failure -tracheostomy status, VDRF  -post-covid pulmonary fibrosis (UIP) -Hodgkin's lymphoma with relapse, without further tx options -anemia of chronic dz -severe protein calorie malnutrition,  - severe cachexia - Failure to Thrive; FTT  -mood disorder: depression, anxiety  -chronic hypotension on midodrine -soft tissue pressure injury -DM2 -DNR status  -pruritis   Best Practice (right click and "Reselect all SmartList Selections" daily)  Diet/type: tubefeeds DVT prophylaxis: prophylactic heparin  GI prophylaxis: PPI Lines: N/A Foley:  N/A Code Status:  DNR Last date of multidisciplinary goals of care discussion: DNR (POA).   CRITICAL CARE Performed by: Cristal Generous  Total critical care time: 36 minutes  Critical care time was exclusive of separately billable procedures and treating other patients. Critical care was necessary to treat or prevent imminent or life-threatening deterioration.  Critical care was time spent personally by me on the following activities: development of treatment plan with patient and/or surrogate as well as nursing, discussions with consultants, evaluation of patient's response to treatment, examination of patient, obtaining history from patient or surrogate, ordering and performing treatments and interventions, ordering and review of laboratory  studies, ordering and review of radiographic studies, pulse oximetry and re-evaluation of patient's condition.  Eliseo Gum MSN, AGACNP-BC [place] for pager  11/13/2020, 10:06 AM     Xxxxxxxxxxxxxxxxxxxxxxxxxxxxxxxxxxxxxxxxxxxxxxxxxx  ATTESTATION & SIGNATURE   STAFF NOTE: I, Dr Ann Lions have personally reviewed patient's available data, including medical history, events of note, physical examination and test results as part of my evaluation. I have discussed with resident/NP and other care providers such as  pharmacist, RN and RRT.  In addition,  I personally evaluated patient and elicited key findings of   S: Date of admit 11/20/2020 with LOS 4 for today 11/13/2020 : [name] is  - worsening hypoxemia past 1h and distress. Increaesd fio2 to 80%. Nurses report he is looking worse slowly  O:  Blood pressure (!) 132/58, pulse 96, temperature 98.9 F (37.2 C), temperature source Axillary, resp. rate (!) 28, height 5\' 5"  (1.651 m), weight 68.3 kg, SpO2 91 %.   Emaciated male, High RR c/w fiubrosis. Trach on vent. 80% fio2, peep 10. Somewhat wheezy    A: Acute on chronic resp failure due to severe pulmonary fiubrosis - terminal prognosis. Severe FTT Severe PCM Severe air hunger with discomfort  P: increaes fio2/peep Add IV prn fent and prn vrdsed - for comfort concomitant Ongoing goals with family   Anti-infectives (From admission, onward)    Start     Dose/Rate Route Frequency Ordered Stop   11/12/20 1430  meropenem (MERREM) 2 g in sodium chloride 0.9 % 100 mL IVPB        2 g 200 mL/hr over 30 Minutes Intravenous Every 8 hours 11/12/20 1337     11/22/2020 2300  vancomycin (VANCOCIN) IVPB 750 mg/150 ml premix  Status:  Discontinued        750 mg 150 mL/hr over 60 Minutes Intravenous Every 12 hours 11/20/2020 1424 12/01/2020 1743   11/27/2020 2300  vancomycin (VANCOREADY) IVPB 750 mg/150 mL  Status:  Discontinued        750 mg 150 mL/hr  over 60 Minutes Intravenous Every 12 hours 11/25/2020 1744 11/10/20 0942   11/29/2020 1930  ceFEPIme (MAXIPIME) 2 g in sodium chloride 0.9 % 100 mL IVPB  Status:  Discontinued        2 g 200 mL/hr over 30 Minutes Intravenous Every 8 hours 12/04/2020 1421 11/12/20 1332   11/17/2020 1400  piperacillin-tazobactam (ZOSYN) IVPB 3.375 g  Status:  Discontinued        3.375 g 12.5 mL/hr over 240 Minutes Intravenous Every 8 hours 12/09/2020 1109 11/28/2020 1421   11/21/2020 1115  vancomycin (VANCOCIN) IVPB 1000 mg/200 mL premix  Status:  Discontinued        1,000 mg 200 mL/hr over 60 Minutes Intravenous  Once 12/05/2020 1109 11/29/2020 1355        Rest per NP/medical resident whose note is outlined above and that I agree with  The patient is critically ill with multiple organ systems failure and requires high complexity decision making for assessment and support, frequent evaluation and titration of therapies, application of advanced monitoring technologies and extensive interpretation of multiple databases.   Critical Care Time devoted to patient care services described in this note is  20  Minutes. This time reflects time of care of this signee Dr Brand Males. This critical care time does not reflect procedure time, or teaching time or supervisory time of PA/NP/Med student/Med Resident etc but could involve care discussion time     Dr. Brand Males, M.D., [facility].C.P Pulmonary and Critical Care Medicine Staff Physician [place] [facility] Pager: [phone], If no answer or between  15:00h - 7:00h: call [phone]  11/13/2020 11:26 AM

## 2020-11-13 NOTE — TOC Progression Note (Signed)
Transition of Care [facility]) - Progression Note    Patient Details  Name: Steve [name] MRN: [id number] Date of Birth: 1955-08-19  Transition of Care [facility]) CM/SW Contact  Leeroy Cha, RN Phone Number: 11/13/2020, 8:14 AM  Clinical Narrative:    TOC PLAN OF CARE:  remains vent dependant, has now been made DNR, Iv ms drip for comfort. Plan is to follow for any changes or toc needs.   Expected Discharge Plan: [place] Barriers to Discharge: Continued Medical Work up  Expected Discharge Plan and Services Expected Discharge Plan: [place]   Discharge Planning Services: CM Consult   Living arrangements for the past 2 months: Mobile Home                                       Social Determinants of Health (SDOH) Interventions    Readmission Risk Interventions Readmission Risk Prevention Plan 09/22/2020 08/24/2020 07/22/2019  Transportation Screening Complete Complete Complete  Medication Review Press photographer) Complete Complete Complete  HRI or Home Care Consult Complete Complete -  SW Recovery Care/Counseling Consult Complete Complete -  Palliative Care Screening Not Applicable Not Applicable -  [place] Not Applicable Not Applicable -  Some recent data might be hidden

## 2020-11-14 ENCOUNTER — Other Ambulatory Visit: Payer: Self-pay | Admitting: Medical

## 2020-11-14 DIAGNOSIS — J189 Pneumonia, unspecified organism: Secondary | ICD-10-CM | POA: Diagnosis not present

## 2020-11-14 DIAGNOSIS — E871 Hypo-osmolality and hyponatremia: Secondary | ICD-10-CM | POA: Diagnosis not present

## 2020-11-14 DIAGNOSIS — Z930 Tracheostomy status: Secondary | ICD-10-CM | POA: Diagnosis not present

## 2020-11-14 DIAGNOSIS — J9621 Acute and chronic respiratory failure with hypoxia: Secondary | ICD-10-CM | POA: Diagnosis not present

## 2020-11-14 LAB — CULTURE, BLOOD (ROUTINE X 2)
Culture: NO GROWTH
Culture: NO GROWTH

## 2020-11-14 LAB — CBC
HCT: 26.8 % — ABNORMAL LOW (ref 39.0–52.0)
Hemoglobin: 7.5 g/dL — ABNORMAL LOW (ref 13.0–17.0)
MCH: 29.1 pg (ref 26.0–34.0)
MCHC: 28 g/dL — ABNORMAL LOW (ref 30.0–36.0)
MCV: 103.9 fL — ABNORMAL HIGH (ref 80.0–100.0)
Platelets: 270 10*3/uL (ref 150–400)
RBC: 2.58 MIL/uL — ABNORMAL LOW (ref 4.22–5.81)
RDW: 16.1 % — ABNORMAL HIGH (ref 11.5–15.5)
WBC: 23.3 10*3/uL — ABNORMAL HIGH (ref 4.0–10.5)
nRBC: 0 % (ref 0.0–0.2)

## 2020-11-14 LAB — GLUCOSE, CAPILLARY
Glucose-Capillary: 134 mg/dL — ABNORMAL HIGH (ref 70–99)
Glucose-Capillary: 138 mg/dL — ABNORMAL HIGH (ref 70–99)
Glucose-Capillary: 174 mg/dL — ABNORMAL HIGH (ref 70–99)
Glucose-Capillary: 193 mg/dL — ABNORMAL HIGH (ref 70–99)
Glucose-Capillary: 177 mg/dL — ABNORMAL HIGH (ref 70–99)

## 2020-11-14 LAB — BASIC METABOLIC PANEL
Anion gap: 5 (ref 5–15)
BUN: 51 mg/dL — ABNORMAL HIGH (ref 8–23)
CO2: 39 mmol/L — ABNORMAL HIGH (ref 22–32)
Calcium: 11.3 mg/dL — ABNORMAL HIGH (ref 8.9–10.3)
Chloride: 98 mmol/L (ref 98–111)
Creatinine, Ser: 1.2 mg/dL (ref 0.61–1.24)
GFR, Estimated: >60 mL/min (ref >60)
Glucose, Bld: 144 mg/dL — ABNORMAL HIGH (ref 70–99)
Potassium: 5 mmol/L (ref 3.5–5.1)
Sodium: 142 mmol/L (ref 135–145)

## 2020-11-14 LAB — MAGNESIUM: Magnesium: 2.6 mg/dL — ABNORMAL HIGH (ref 1.7–2.4)

## 2020-11-14 LAB — PHOSPHORUS: Phosphorus: 4.2 mg/dL (ref 2.5–4.6)

## 2020-11-14 MED ORDER — MORPHINE SULFATE 10 MG/5ML PO SOLN
5.0000 mg | ORAL | Status: DC
Start: 2020-11-14 — End: 2020-11-16
  Administered 2020-11-14 – 2020-11-16 (×10): 5 mg
  Filled 2020-11-14 (×11): qty 5

## 2020-11-14 MED ORDER — PANTOPRAZOLE SODIUM 40 MG PO PACK
40.0000 mg | PACK | Freq: Every day | ORAL | Status: DC
  Administered 2020-11-14 – 2020-11-17 (×4): 40 mg
  Filled 2020-11-14 (×4): qty 20

## 2020-11-14 MED ORDER — GERHARDT'S BUTT CREAM
TOPICAL_CREAM | Freq: Two times a day (BID) | CUTANEOUS | Status: DC
  Administered 2020-11-15 – 2020-11-18 (×2): 1 via TOPICAL
  Filled 2020-11-14 (×2): qty 1

## 2020-11-14 MED ORDER — PANTOPRAZOLE SODIUM 40 MG PO TBEC: 40.0000 mg | DELAYED_RELEASE_TABLET | Freq: Two times a day (BID) | ORAL | Status: DC

## 2020-11-14 NOTE — Progress Notes (Signed)
NAME:  Steve [name], MRN:  [id number], DOB:  [birth year], LOS: 5 ADMISSION DATE:  12/03/2020, CONSULTATION DATE:  8/1 REFERRING MD:  Dr. Colin Mulders, CHIEF COMPLAINT:  Increased vent use   BRIEF  65 y/o M who presented to [facility] ER on 8/1 with reports of increased vent use over the preceding 4 days and desaturations.    The patient is followed by Dr. Marin Olp for Hodgin's Lymphoma and was deemed not a candidate for further treatment due to his functional status. He was last reviewed on 11/03/20 with plan for follow up in 4 weeks to reassess functional status. He has had a prolonged chronic critical illness since March of 2021 - including lymphoma, COVID, ARDS thought multifactorial in the setting of COVID and possible pneumonitis from immunotherapy, chronic trach/vent, multiple chest tubes, malnutrition, bacteremia, frequent readmissions and failure to thrive.   He was  admitted from 6/11 - 7/18 for poor appetite and malaise.  Work up at that time found him to be positive for staph epidermidis bacteremia.  He was seen by ID with a discharge plan of 6 weeks of IV antibiotics (first negative blood culture 09/22/20).    His wife reported the patient has been requiring the home vent consistently over the last four days PTA.  He previously had been on the ATC during the day times.  He has home health that come in to assist in his care 2x per week.  His wife and the home care RN noted he used the urinal on 8/1 and desaturated into 80's. They re-checked the sats via home health RN's meter and they were in the 50's.  EMS was activated. On ER arrival, he was noted to be somnolent on the vent and lethargic.  ABG evaluation pending. Most recent ABG in July 7.318 / 88.   CXR review showed known severe diffuse bilateral airspace disease, moderate right pleural effusion. Wife reported he had not had his midodrine until he got to the ER.  Initial labs notable for CO2 of 42, glucose 172, BUN 28 / Sr Cr 0.85 (up from 0.5),  albumin 2.1, lactic acid 4.1, WBC 26.6, Hgb 9.4, and platelets 306.    PCCM consulted for evaluation of hypoxic respiratory failure.   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 Down to 60% PEEP 10    Scheduled Meds:  chlorhexidine gluconate (MEDLINE KIT)  15 mL Mouth Rinse BID   Chlorhexidine Gluconate Cloth  6 each Topical Daily   cholecalciferol  1,000 Units Oral Daily   feeding supplement (PROSource TF)  45 mL Per Tube TID   free water  100 mL Per Tube Q4H   heparin  5,000 Units Subcutaneous Q8H   insulin aspart  0-15 Units Subcutaneous Q4H   mouth rinse  15 mL Mouth Rinse 10 times per day   midodrine  10 mg Per Tube TID WC   multivitamin  15 mL Per Tube Daily   pantoprazole (PROTONIX) IV  40 mg Intravenous QHS   sertraline  100 mg Per Tube Daily   Continuous  Infusions:  feeding supplement (OSMOLITE 1.5 CAL) 1,000 mL (11/14/20 0349)   meropenem (MERREM) IV Stopped (11/14/20 0601)   PRN Meds:.acetaminophen (TYLENOL) oral liquid 160 mg/5 mL, calamine, diphenhydrAMINE, diphenhydrAMINE-zinc acetate, docusate sodium, fentaNYL (SUBLIMAZE) injection, guaiFENesin, hyoscyamine, ipratropium-albuterol, loperamide HCl, LORazepam, midazolam, morphine, polyethylene glycol, triamcinolone cream     Interim History / Subjective:  Uncomfortable on vent with relatively high wob and increasing 02 needs   Objective   Blood pressure 103/79, pulse 88, temperature 98.1 F (36.7 C), temperature source Oral, resp. rate (!) 25, height 5' 5"  (1.651 m), weight 68.7 kg, SpO2 98 %.     Vent Mode: PRVC FiO2 (%):  [60 %-80 %] 70 % Set Rate:  [14 bmp] 14 bmp Vt Set:  [490 mL] 490 mL PEEP:  [10 cmH20] 10 cmH20 Plateau Pressure:  [34 cmH20-39 cmH20] 34 cmH20   Intake/Output Summary (Last 24 hours) at 11/14/2020 0947 Last data filed at 11/14/2020 6387 Gross per 24 hour  Intake 2696.34 ml  Output 2325 ml  Net 371.34 ml   Filed Weights   11/12/20 0437 11/13/20 0500 11/14/20 0351  Weight: 66.5 kg 68.3 kg 68.7 kg    Examination: Tmax  99.1 General appearance:  acute and chronically ill appearing     No jvd Oropharynx  clear Neck supple Lungs with   exp=  insp rhonchi bilaterally RRR no s3 or or sign murmur Abd soft/  pet in site   Extr warm with no edema or clubbing noted Neuro  Sensorium appears alert/ anxious,  no apparent motor deficits      I personally reviewed images and agree with radiology impression as follows:  CXR:   portable 8/5 1. Possible progression of the airspace disease or parenchymal disease in the right lung. Again noted is extensive parenchymal and pleural based disease throughout the right hemithorax. 2. Interstitial densities in left lung have not significantly changed.   LABS    PULMONARY Recent Labs  Lab 11/25/2020 1454  PHART 7.403  PCO2ART 68.1*  PO2ART 75.0*  HCO3 42.4*  O2SAT 96.1    CBC Recent Labs  Lab 11/12/20 0435 11/13/20 0500 11/14/20 0345  HGB 7.5* 7.5* 7.5*  HCT 25.7* 27.0* 26.8*  WBC 19.3* 20.7* 23.3*  PLT 243 264 270    COAGULATION Recent Labs  Lab 11/13/2020 1210  INR 1.4*    CARDIAC  No results for input(s): TROPONINI in the last 168 hours. No results for input(s): PROBNP in the last 168 hours.   CHEMISTRY Recent Labs  Lab 11/10/20 0530 11/11/20 0330 11/12/20 0435 11/13/20 0500 11/14/20 0345  NA 139 138 143 146* 142  K 4.4 3.8 4.1 4.5 5.0  CL 94* 93* 97* 104 98  CO2 37* 38* 38* 38* 39*  GLUCOSE 187* 200* 184* 161* 144*  BUN 38* 40* 41* 47* 51*  CREATININE 0.99 0.94 0.93 1.05 1.20   CALCIUM 11.5* 10.8* 10.9* 11.1* 11.3*  MG 2.2 2.1 2.4 2.5* 2.6*  PHOS 3.3 3.1 4.3 4.3 4.2   Estimated Creatinine Clearance: 53.4 mL/min (by C-G formula based on SCr of 1.2 mg/dL).   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 There was no Pseudomonas on 5/22 resp cx  -finished abx course 7/26 for staph epi bacteremia Plan Rx meropenem  8/4 >>>    Chronic hypotension  Plan -continue midodrine  -mIVF   AKI  Lab Results  Component Value Date   CREATININE 1.20 11/14/2020   CREATININE 1.05 11/13/2020   CREATININE 0.93 11/12/2020  >>> gentle hydration       Anemia of chronic disease    Lab Results  Component Value Date   HGB 7.5 (L) 11/14/2020   HGB 7.5 (L) 11/13/2020   HGB 7.5 (L) 11/12/2020   HGB 11.3 (L) 09/08/2020    HGB 9.3 (L) 08/05/2020   HGB 9.5 (L) 07/10/2020   HGB 10.3 (L) 07/15/2019    Plan -Trend CBC qD  -hgb goal >7   Dm2 with hyperglycemia  Plan -mSSI   Hodgkin's lymphoma, relapsed  Plan -Follow with Dr. Marin Olp -- unfortunately no further options   FTT Severe protein calorie malnutrition Plan -EN via PEG   Depression, Anxiety  Plan -zoloft, ativan -- home meds  Dermatitis? Plan -Benadryl cream -PRN IV benadryl -[place] BID pepcid  -deferring adding steroids with severe sepsis   DNR Status Encounter for palliative care  -DNR on admission -Anant has a good understanding of how tenuous his baseline health status is, and that he is at risk for significant decompensation and possibly death  P -DNR -Continue GOC conversations, palliative consult - increase MS  8/6   Chronic conditions present on admit -chronic hypoxic hypercarbic respiratory failure -tracheostomy status, VDRF  -post-covid pulmonary fibrosis (UIP) -Hodgkin's lymphoma with relapse, without further tx options -anemia of chronic dz -severe protein calorie malnutrition,  - severe cachexia - Failure to Thrive; FTT  -mood disorder: depression, anxiety  -chronic hypotension on midodrine -soft tissue pressure injury -DM2 -DNR status  -pruritis   Best Practice (right click and "Reselect all SmartList Selections" daily)  Diet/type: tubefeeds DVT prophylaxis: prophylactic heparin  GI prophylaxis: PPI Lines: N/A Foley:  N/A Code Status:  DNR Last date of multidisciplinary goals of care discussion: DNR (POA).   The patient is critically ill with multiple organ systems failure and requires high complexity decision making for assessment and support, frequent evaluation and titration of therapies, application of advanced monitoring technologies and extensive interpretation of multiple databases. Critical Care Time devoted to patient care services described in this note is 40 minutes.   Christinia Gully,  MD Pulmonary and [place] [phone]  After 7:00 pm call Elink  [phone]   Christinia Gully, MD Pulmonary and [place] [phone]   After 7:00 pm call Elink  [phone]

## 2020-11-15 DIAGNOSIS — J189 Pneumonia, unspecified organism: Secondary | ICD-10-CM | POA: Diagnosis not present

## 2020-11-15 DIAGNOSIS — J9621 Acute and chronic respiratory failure with hypoxia: Secondary | ICD-10-CM | POA: Diagnosis not present

## 2020-11-15 DIAGNOSIS — J9622 Acute and chronic respiratory failure with hypercapnia: Secondary | ICD-10-CM | POA: Diagnosis not present

## 2020-11-15 DIAGNOSIS — Z930 Tracheostomy status: Secondary | ICD-10-CM | POA: Diagnosis not present

## 2020-11-15 LAB — GLUCOSE, CAPILLARY
Glucose-Capillary: 129 mg/dL — ABNORMAL HIGH (ref 70–99)
Glucose-Capillary: 133 mg/dL — ABNORMAL HIGH (ref 70–99)
Glucose-Capillary: 139 mg/dL — ABNORMAL HIGH (ref 70–99)
Glucose-Capillary: 158 mg/dL — ABNORMAL HIGH (ref 70–99)
Glucose-Capillary: 189 mg/dL — ABNORMAL HIGH (ref 70–99)
Glucose-Capillary: 189 mg/dL — ABNORMAL HIGH (ref 70–99)
Glucose-Capillary: 204 mg/dL — ABNORMAL HIGH (ref 70–99)
Glucose-Capillary: 252 mg/dL — ABNORMAL HIGH (ref 70–99)

## 2020-11-15 LAB — SEDIMENTATION RATE: Sed Rate: 100 mm/hr — ABNORMAL HIGH (ref 0–16)

## 2020-11-15 MED ORDER — METHYLPREDNISOLONE SODIUM SUCC 125 MG IJ SOLR
80.0000 mg | Freq: Two times a day (BID) | INTRAMUSCULAR | Status: DC
  Administered 2020-11-15 – 2020-11-16 (×4): 80 mg via INTRAVENOUS
  Filled 2020-11-15 (×4): qty 2

## 2020-11-15 NOTE — Progress Notes (Signed)
Pharmacy Antibiotic Note  Steve [name] is a 65 y.o. male with prolonged critical illness admitted on 12/01/2020 with hypoxic respiratory failure.  Pharmacy has been consulted for meropenem dosing for pseudomonas HAP.  Day #4 meropenem  - Afebrile - WBC 23.3 on 8/6 - SCr up 1.2 on 8/6, CrCl ~53 ml/min  Plan: Continue meropenem 1g IV q8h Follow up renal function & cultures, duration of therapy  Height: 5\' 5"  (165.1 cm) Weight: 68.7 kg (151 lb 7.3 oz) IBW/kg (Calculated) : 61.5  Temp (24hrs), Avg:98.6 F (37 C), Min:98.4 F (36.9 C), Max:99 F (37.2 C)  Recent Labs  Lab 12/03/2020 1210 11/13/2020 1433 11/21/2020 1703 11/10/20 0530 11/11/20 0330 11/12/20 0435 11/13/20 0500 11/14/20 0345  WBC  --   --    < > 25.3* 19.7* 19.3* 20.7* 23.3*  CREATININE 0.85  --   --  0.99 0.94 0.93 1.05 1.20  LATICACIDVEN 4.1* 1.2  --   --   --   --   --   --    < > = values in this interval not displayed.    Estimated Creatinine Clearance: 53.4 mL/min (by C-G formula based on SCr of 1.2 mg/dL).    No Known Allergies  Antimicrobials this admission: Cefepime 8/1 >> 8/4 Vanc 8/1 >> 8/2 Merrem 8/4 >>   Microbiology results: 8/1 BCx: ngtd 8/1 Sputum: pseudomonas aeruginosa (S cipro, ceftaz, imi, gent) 8/1 MRSA PCR: neg 8/2 Legionella: neg 8/2 strep pneumo urine ag: neg  Thank you for allowing pharmacy to be a part of this patient's care.  Peggyann Juba, PharmD, BCPS Pharmacy: [phone] 11/15/2020 8:47 AM

## 2020-11-15 NOTE — Progress Notes (Signed)
NAME:  Steve [name], MRN:  [id number], DOB:  Mar 11, 1956, LOS: 6 ADMISSION DATE:  12/03/2020, CONSULTATION DATE:  8/1 REFERRING MD:  Dr. Colin Mulders, CHIEF COMPLAINT:  Increased vent use   BRIEF  65 y/o M who presented to [facility] ER on 8/1 with reports of increased vent use over the preceding 4 days and desaturations.    The patient is followed by Dr. Marin Olp for Hodgin's Lymphoma and was deemed not a candidate for further treatment due to his functional status. He was last reviewed on 11/03/20 with plan for follow up in 4 weeks to reassess functional status. He has had a prolonged chronic critical illness since March of 2021 - including lymphoma, COVID, ARDS thought multifactorial in the setting of COVID and possible pneumonitis from immunotherapy, chronic trach/vent, multiple chest tubes, malnutrition, bacteremia, frequent readmissions and failure to thrive.   He was  admitted from 6/11 - 7/18 for poor appetite and malaise.  Work up at that time found him to be positive for staph epidermidis bacteremia.  He was seen by ID with a discharge plan of 6 weeks of IV antibiotics (first negative blood culture 09/22/20).    His wife reported the patient has been requiring the home vent consistently over the last four days PTA.  He previously had been on the ATC during the day times.  He has home health that come in to assist in his care 2x per week.  His wife and the home care RN noted he used the urinal on 8/1 and desaturated into 80's. They re-checked the sats via home health RN's meter and they were in the 50's.  EMS was activated. On ER arrival, he was noted to be somnolent on the vent and lethargic.  ABG evaluation pending. Most recent ABG in July 7.318 / 88.   CXR review showed known severe diffuse bilateral airspace disease, moderate right pleural effusion. Wife reported he had not had his midodrine until he got to the ER.  Initial labs notable for CO2 of 42, glucose 172, BUN 28 / Sr Cr 0.85 (up from 0.5),  albumin 2.1, lactic acid 4.1, WBC 26.6, Hgb 9.4, and platelets 306.    PCCM consulted for evaluation of hypoxic respiratory failure.   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 Down to 60% PEEP 10  8/8 worse gas exchange, FI02 up to 100%    Scheduled Meds:  chlorhexidine gluconate (MEDLINE KIT)  15 mL Mouth Rinse BID   Chlorhexidine Gluconate Cloth  6 each Topical Daily   cholecalciferol  1,000 Units Oral Daily   feeding supplement (PROSource TF)  45 mL Per Tube TID   free water  100 mL Per Tube Q4H   Gerhardt's butt cream   Topical BID   heparin  5,000 Units Subcutaneous Q8H   insulin aspart  0-15 Units Subcutaneous Q4H   mouth rinse  15 mL Mouth Rinse 10 times per day   midodrine  10 mg Per Tube TID WC   morphine  5 mg Per Tube Q4H   multivitamin  15 mL Per Tube Daily   pantoprazole sodium  40 mg Per Tube Q1200   sertraline  100 mg Per Tube Daily   Continuous Infusions:  feeding supplement (OSMOLITE 1.5 CAL) 1,000 mL (11/15/20 0113)   meropenem (MERREM) IV Stopped (11/15/20 0552)   PRN Meds:.acetaminophen (TYLENOL) oral liquid 160 mg/5 mL, calamine, diphenhydrAMINE, diphenhydrAMINE-zinc acetate, docusate sodium, fentaNYL (SUBLIMAZE) injection, guaiFENesin, hyoscyamine, ipratropium-albuterol, loperamide HCl, LORazepam, midazolam, polyethylene glycol, triamcinolone cream     Interim History / Subjective:  Much more comfortable on MS scheduled but arouses to verbal   Objective   Blood pressure 131/63, pulse 98, temperature 98.7 F (37.1 C), temperature source  Axillary, resp. rate 20, height 5' 5" (1.651 m), weight 68.7 kg, SpO2 96 %.    Vent Mode: PRVC FiO2 (%):  [80 %-100 %] 100 % Set Rate:  [14 bmp] 14 bmp Vt Set:  [490 mL] 490 mL PEEP:  [10 cmH20] 10 cmH20 Plateau Pressure:  [33 cmH20-40 cmH20] 40 cmH20   Intake/Output Summary (Last 24 hours) at 11/15/2020 1102 Last data filed at 11/15/2020 0522 Gross per 24 hour  Intake 2130.78 ml  Output 1675 ml  Net 455.78 ml   Filed Weights   11/13/20 0500 11/14/20 0351 11/15/20 0500  Weight: 68.3 kg 68.7 kg 68.7 kg    Examination: Tmax   99 General appearance:    terminally ill appearing   At Rest 02 sats  92% on 100% 02  No jvd Oropharynx clear,  mucosa nl Neck supple/ trach in place with small air leak Lungs with a few scattered exp > insp rhonchi bilaterally RRR no s3 or or sign murmur Abd relatively sofe, nl  excursion  Extr warm with no edema or clubbing noted       LABS    PULMONARY Recent Labs  Lab 11/23/2020 1454  PHART 7.403  PCO2ART 68.1*  PO2ART 75.0*  HCO3 42.4*  O2SAT 96.1    CBC Recent Labs  Lab 11/12/20 0435 11/13/20 0500 11/14/20 0345  HGB 7.5* 7.5* 7.5*  HCT 25.7* 27.0* 26.8*  WBC 19.3* 20.7* 23.3*  PLT 243 264 270    COAGULATION Recent Labs  Lab 11/27/2020 1210  INR 1.4*    CARDIAC  No results for input(s): TROPONINI in the last 168 hours. No results for input(s): PROBNP in the last 168 hours.   CHEMISTRY Recent Labs  Lab 11/10/20 0530 11/11/20 0330 11/12/20 0435 11/13/20 0500 11/14/20 0345  NA 139 138 143 146* 142  K 4.4 3.8 4.1 4.5 5.0  CL 94* 93* 97* 104 98  CO2 37* 38* 38* 38* 39*  GLUCOSE 187* 200* 184* 161* 144*  BUN 38* 40* 41* 47* 51*  CREATININE 0.99 0.94 0.93 1.05 1.20  CALCIUM 11.5* 10.8* 10.9* 11.1* 11.3*  MG 2.2 2.1 2.4 2.5* 2.6*  PHOS 3.3 3.1 4.3 4.3 4.2   Estimated Creatinine Clearance: 53.4 mL/min (by C-G formula based on SCr of 1.2 mg/dL).   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 There was no Pseudomonas on 5/22 resp cx  -finished abx course 7/26 for staph epi bacteremia Plan Rx meropenem  8/4 >>>    Chronic hypotension  Plan -continue midodrine  -mIVF   AKI  Lab Results  Component Value Date   CREATININE 1.20 11/14/2020   CREATININE 1.05 11/13/2020   CREATININE 0.93 11/12/2020  >>> gentle hydration       Anemia of chronic disease    Lab Results  Component Value Date   HGB 7.5 (L) 11/14/2020   HGB 7.5 (L) 11/13/2020   HGB 7.5 (L) 11/12/2020   HGB 11.3 (L) 09/08/2020   HGB 9.3 (L) 08/05/2020   HGB 9.5 (L) 07/10/2020   HGB 10.3 (L) 07/15/2019    Plan -Trend CBC qD  -hgb goal >7   Dm2 with hyperglycemia  Plan -mSSI   Hodgkin's lymphoma, relapsed  Plan -Follow with Dr. Marin Olp -- unfortunately no further options   FTT Severe protein calorie malnutrition Plan -EN via PEG   Depression, Anxiety  Plan -zoloft, ativan -- home meds  Dermatitis? Plan -Benadryl cream -PRN IV  benadryl -[place] BID pepcid  -deferring adding steroids with severe sepsis   DNR Status Encounter for palliative care  -DNR on admission   P -DNR -Continue [place] conversations, palliative consult - increased MS  8/6  to q 4 h as maint rx, not prn  Chronic conditions present on admit -chronic hypoxic hypercarbic respiratory failure -tracheostomy status, VDRF  -post-covid pulmonary fibrosis (UIP) -Hodgkin's lymphoma with relapse, without further tx options -anemia of chronic dz -severe protein calorie malnutrition,  - severe cachexia - Failure to Thrive; FTT  -mood disorder: depression, anxiety  -chronic hypotension on midodrine -soft tissue pressure injury -DM2 -DNR status  -pruritis   Best Practice (right click and "Reselect all SmartList Selections" daily)  Diet/type: tubefeeds DVT prophylaxis: prophylactic heparin  GI prophylaxis: PPI Lines: N/A Foley:  N/A Code Status:  DNR Last date of multidisciplinary goals of care discussion: DNR (POA).     Christinia Gully, MD Pulmonary and [place] [phone]   After 7:00 pm call Elink  [phone]

## 2020-11-16 DIAGNOSIS — E871 Hypo-osmolality and hyponatremia: Secondary | ICD-10-CM | POA: Diagnosis not present

## 2020-11-16 DIAGNOSIS — J9621 Acute and chronic respiratory failure with hypoxia: Secondary | ICD-10-CM | POA: Diagnosis not present

## 2020-11-16 DIAGNOSIS — Z930 Tracheostomy status: Secondary | ICD-10-CM | POA: Diagnosis not present

## 2020-11-16 DIAGNOSIS — J189 Pneumonia, unspecified organism: Secondary | ICD-10-CM | POA: Diagnosis not present

## 2020-11-16 LAB — GLUCOSE, CAPILLARY
Glucose-Capillary: 196 mg/dL — ABNORMAL HIGH (ref 70–99)
Glucose-Capillary: 261 mg/dL — ABNORMAL HIGH (ref 70–99)
Glucose-Capillary: 214 mg/dL — ABNORMAL HIGH (ref 70–99)
Glucose-Capillary: 204 mg/dL — ABNORMAL HIGH (ref 70–99)
Glucose-Capillary: 250 mg/dL — ABNORMAL HIGH (ref 70–99)
Glucose-Capillary: 199 mg/dL — ABNORMAL HIGH (ref 70–99)

## 2020-11-16 MED ORDER — CHLORHEXIDINE GLUCONATE CLOTH 2 % EX PADS: 6.0000 | MEDICATED_PAD | Freq: Every day | CUTANEOUS | Status: DC

## 2020-11-16 MED ORDER — MORPHINE SULFATE 10 MG/5ML PO SOLN
5.0000 mg | ORAL | Status: DC | PRN
  Administered 2020-11-16 – 2020-11-17 (×5): 5 mg
  Filled 2020-11-16 (×5): qty 5

## 2020-11-16 MED ORDER — GLUCERNA 1.5 CAL PO LIQD
1000.0000 mL | ORAL | Status: DC
  Administered 2020-11-16 – 2020-11-18 (×2): 1000 mL
  Filled 2020-11-16 (×3): qty 1000

## 2020-11-16 MED ORDER — FREE WATER
150.0000 mL | Status: DC
  Administered 2020-11-16 – 2020-11-17 (×5): 150 mL

## 2020-11-16 NOTE — Plan of Care (Signed)
Discussed in front of patient plan of care for the evening, pain management and mouth care with no evidence of learning at this time.   Problem: Education: Goal: Knowledge of General Education information will improve Description: Including pain rating scale, medication(s)/side effects and non-pharmacologic comfort measures Outcome: Not Progressing   Problem: Health Behavior/Discharge Planning: Goal: Ability to manage health-related needs will improve Outcome: Not Progressing

## 2020-11-16 NOTE — Progress Notes (Signed)
Nutrition Follow-up  DOCUMENTATION CODES:   Non-severe (moderate) malnutrition in context of chronic illness  INTERVENTION:  - will adjust TF regimen to aid in glycemic control: Glucerna 1.5 @ 30 ml/hr to advance by 10 ml every 24 hours to reach goal rate of  60 ml/hr with 150 ml free water every 4 hours. - at goal rate, this regimen will provide 2160 kcal, 119 grams protein, 23 grams fiber, and 1993 ml free water.   - slow advancement d/t introduction of fiber as Osmolite 1.5 is a fiber-free formula.    NUTRITION DIAGNOSIS:   Moderate Malnutrition related to chronic illness, cancer and cancer related treatments as evidenced by severe fat depletion, moderate muscle depletion. -ongoing  GOAL:   Patient will meet greater than or equal to 90% of their needs -met with current TF regimen  MONITOR:   Vent status, TF tolerance, Labs, Weight trends, Skin  ASSESSMENT:   65 year-old male with medical history of Hodgkin's lymphoma (not a candidate for further treatment d/t functional status), COVID-19 with resultant need for trach, FTT, severe malnutrition, congenital hip dysplasia, GERD, sleep apnea, DM, HTN, and anxiety. He presented to the ED on 8/1 d/t increased vent use in the previous 4 days. He was admitted 6/11-7/18 d/t poor appetite and malaise.  Patient remains on the vent via trach. PEG in place and he is receiving Osmolite 1.5 @ 55 ml/hr with 45 ml Prosource TF TID and 100 ml free water every 4 hours. This regimen is providing 2100 kcal, 116 grams protein, and 1606 ml free water.   No family or visitors present. Able to talk with RN about TF; no issues.   He is noted to be +9.4 L since admission. Weight today is +13 lb compared to admission (8/1) weight.     Patient is currently intubated on ventilator support MV: 11.1 L/min Temp (24hrs), Avg:98.5 F (36.9 C), Min:97.8 F (36.6 C), Max:98.9 F (37.2 C)   Labs reviewed; CBGs: 196, 261, 214 mg/dl; from 8/6--BUN: 51 mg/dl,  Ca: 11.3 mg/dl, Mg: 2.6 mg/dl.  Medications reviewed; 1000 units cholecalciferol/day, sliding scale novolog, PRN imodium, 15 ml multivitamin/day, 40 mg protonix per PEG/day.   Diet Order:   Diet Order             Diet NPO time specified  Diet effective now                   EDUCATION NEEDS:   No education needs have been identified at this time  Skin:  Skin Assessment: Skin Integrity Issues: Skin Integrity Issues:: Stage I Stage I: sacrum Other: MASD to mid-buttocks; MASD to lower R flank; MASD to bilateral anterior thigh (newly documented 8/6)  Last BM:  8/6  Height:   Ht Readings from Last 1 Encounters:  11/16/2020 5' 5"  (1.651 m)    Weight:   Wt Readings from Last 1 Encounters:  11/16/20 69.8 kg    Estimated Nutritional Needs:  Kcal:  2000-2200 kcal Protein:  110-125 grams Fluid:  >/= 2 L/day     Jarome Matin, MS, RD, LDN, CNSC Inpatient Clinical Dietitian RD pager # available in [place]  After hours/weekend pager # available in [facility]

## 2020-11-16 NOTE — Progress Notes (Addendum)
NAME:  Steve [name], MRN:  [id number], DOB:  02/26/1956, LOS: 7 ADMISSION DATE:  11/24/2020, CONSULTATION DATE:  8/1 REFERRING MD:  Dr. Colin Mulders, CHIEF COMPLAINT:  Increased vent use   Brief History:   65 y/o M who presented to [facility] ER on 8/1 with reports of increased vent use over the preceding 4 days and desaturations.    The patient is followed by Dr. Marin Olp for Hodgin's Lymphoma and was deemed not a candidate for further treatment due to his functional status. He was last reviewed on 11/03/20 with plan for follow up in 4 weeks to reassess functional status. He has had a prolonged chronic critical illness since March of 2021 - including lymphoma, COVID, ARDS thought multifactorial in the setting of COVID and possible pneumonitis from immunotherapy, chronic trach/vent, multiple chest tubes, malnutrition, bacteremia, frequent readmissions and failure to thrive.   He was  admitted from 6/11 - 7/18 for poor appetite and malaise.  Work up at that time found him to be positive for staph epidermidis bacteremia.  He was seen by ID with a discharge plan of 6 weeks of IV antibiotics (first negative blood culture 09/22/20).    His wife reported the patient has been requiring the home vent consistently over the last four days PTA.  He previously had been on the ATC during the day times.  He has home health that come in to assist in his care 2x per week.  His wife and the home care RN noted he used the urinal on 8/1 and desaturated into 80's. They re-checked the sats via home health RN's meter and they were in the 50's.  EMS was activated. On ER arrival, he was noted to be somnolent on the vent and lethargic.  ABG evaluation pending. Most recent ABG in July 7.318 / 88.   CXR review showed known severe diffuse bilateral airspace disease, moderate right pleural effusion. Wife reported he had not had his midodrine until he got to the ER.  Initial labs notable for CO2 of 42, glucose 172, BUN 28 / Sr Cr 0.85 (up from  0.5), albumin 2.1, lactic acid 4.1, WBC 26.6, Hgb 9.4, and platelets 306.    PCCM consulted for evaluation of hypoxic respiratory failure.   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 Down to 60% PEEP 10  8/7 worse gas exchange, FI02 up to 100%. Much more comfortable on MS scheduled but arouses to verbal   Interim History / Subjective:  RN reports pt breathing appears more comfortable but does not wake to voice, on scheduled morphine  Afebrile  I/O 450 ml UOP, +1.8L in last 24 hours Sed rate remains elevated   Objective   Blood pressure (!) 119/51, pulse (!) 112, temperature 98.9 F (37.2 C), temperature source Axillary, resp. rate 20, height 5\' 5"  (1.651 m), weight 69.8 kg, SpO2 93 %.    Vent Mode: PRVC FiO2 (%):  [100 %] 100 % Set Rate:  [14 bmp] 14 bmp Vt Set:  [490 mL] 490 mL PEEP:  [10 cmH20] 10 cmH20  Plateau Pressure:  [36 cmH20-43 cmH20] 43 cmH20   Intake/Output Summary (Last 24 hours) at 11/16/2020 4132 Last data filed at 11/16/2020 0600 Gross per 24 hour  Intake 2323.27 ml  Output 450 ml  Net 1873.27 ml   Filed Weights   11/14/20 0351 11/15/20 0500 11/16/20 0500  Weight: 68.7 kg 68.7 kg 69.8 kg    Examination: General: chronically critically ill appearing adult male lying in bed on vent in NAD HEENT: MM pink/moist, dry patchy areas of erythema on face, anicteric, pupils equal  Neuro: eyes closed, does not wake to voice, noted raise of eyebrow when name called, no follow commands, resting comfortably CV: s1s2 RRR, ST on monitor, no m/r/g PULM:  non-labored, lungs bilaterally coarse, trach midline c/d/I  GI: soft, bsx4 active, PEG  Extremities: warm/dry, 1+ BLE pitting edema  Skin: no rashes or lesions   Resolved Hospital Problem list   Staph Epi Bacteremia   Assessment & Plan:   Acute on Chronic Hypoxic and Hypercarbic Respiratory Failure 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  -meropenem, D5/x.  Consider 7 days and then stop.  -trach care per protocol  -continue solumedrol 80 mg IV Q12 x48h trial -PRVC 8cc/kg as rest mode, unable to wean  -follow intermittent CXR  -reduce morphine to PRN, discussed with Palliative Care  Severe Sepsis due to Pseudomonas Aeruginosa HCAP Trach asp 8/1 with few [place]. There was no Pseudomonas on 5/22 resp cx.  Finished abx course 7/26 for staph epi bacteremia -continue meropenem for pansensitive pseudomonas    Chronic Hypotension  -continue midodrine 10mg  TID   AKI  -free water PT  -Trend BMP / urinary output -Replace electrolytes as indicated -Avoid nephrotoxic agents, ensure adequate renal perfusion  Anemia of Chronic Disease  -follow CBC -transfuse for Hgb <7% or active bleeding   DM II with Steroid Induced Hyperglycemia  -continue SSI, moderate scale  -anticipate steroids to taper 8/9 so will hold increase in SSI   Hodgkin's lymphoma, relapsed  Follows with Dr. Marin Olp -- unfortunately no further options   FTT Severe protein calorie malnutrition -continue TF per Nutrition   Depression, Anxiety  -continue ativan, zoloft   Dermatitis -continue benadryl cream, pepcid BID -PRN IV benadryl   DNR Status Encounter for Palliative Care  -DNR on admit  -continue GOC, appreciate Palliative Care team  -change morphine back to PRN 8/8 given sedation   Chronic Conditions Present on Admit -chronic  hypoxic hypercarbic respiratory failure -tracheostomy status, VDRF  -post-covid pulmonary fibrosis (UIP) -Hodgkin's lymphoma with relapse, without further tx options -anemia of chronic dz -severe protein calorie malnutrition,  - severe cachexia - Failure to Thrive; FTT  -mood disorder: depression, anxiety  -chronic hypotension on midodrine -soft tissue pressure injury -DM2 -DNR status  -pruritis   Best Practice (right click and "Reselect all SmartList Selections" daily)  Diet/type: tubefeeds DVT prophylaxis: prophylactic heparin  GI prophylaxis: PPI Lines: N/A Foley:  N/A Code Status:  DNR Last date of multidisciplinary goals of care discussion: DNR (POA).     Noe Gens, MSN, APRN, NP-C, AGACNP-BC [facility] 11/16/2020, 8:14 AM   Please see [email or web address] for pager details.   From 7A-7P if no response, please call [phone] After hours, please call [place] [phone]   Patient was seen this morning, independently examined, care plan was formulated and discussed with Noe Gens by documentation  No overnight events Stable hemodynamics Chronically ill-appearing, lethargic, not following commands S1-S2 appreciated Coarse breath sounds bilaterally S1-S2 appreciated Bilateral lower extremity edema  Acute on chronic hypoxic and hypercapnic respiratory failure Pseudomonas HCAP Tracheobronchitis Chronic pleural effusion Chronic hypotension AKI Severe sepsis due to Pseudomonas  Patient is DNR status  Multiple chronic debilitating conditions with progression  Continue lines of care at present  Sherrilyn Rist, MD [place] [facility] Pager: See Shea Evans

## 2020-11-17 ENCOUNTER — Inpatient Hospital Stay (HOSPITAL_COMMUNITY): Payer: BC Managed Care – PPO

## 2020-11-17 DIAGNOSIS — R012 Other cardiac sounds: Secondary | ICD-10-CM | POA: Diagnosis not present

## 2020-11-17 DIAGNOSIS — Z930 Tracheostomy status: Secondary | ICD-10-CM | POA: Diagnosis not present

## 2020-11-17 DIAGNOSIS — J9621 Acute and chronic respiratory failure with hypoxia: Secondary | ICD-10-CM | POA: Diagnosis not present

## 2020-11-17 DIAGNOSIS — E871 Hypo-osmolality and hyponatremia: Secondary | ICD-10-CM | POA: Diagnosis not present

## 2020-11-17 DIAGNOSIS — J189 Pneumonia, unspecified organism: Secondary | ICD-10-CM | POA: Diagnosis not present

## 2020-11-17 LAB — BLOOD GAS, VENOUS
Acid-Base Excess: 17.5 mmol/L — ABNORMAL HIGH (ref 0.0–2.0)
Bicarbonate: 46.1 mmol/L — ABNORMAL HIGH (ref 20.0–28.0)
O2 Saturation: 90.4 %
Patient temperature: 98.6
pCO2, Ven: 116 mmHg (ref 44.0–60.0)
pH, Ven: 7.222 — ABNORMAL LOW (ref 7.250–7.430)
pO2, Ven: 62.9 mmHg — ABNORMAL HIGH (ref 32.0–45.0)

## 2020-11-17 LAB — BASIC METABOLIC PANEL
Anion gap: 5 (ref 5–15)
Anion gap: 7 (ref 5–15)
Anion gap: <0 — ABNORMAL LOW (ref 5–15)
BUN: 99 mg/dL — ABNORMAL HIGH (ref 8–23)
BUN: 98 mg/dL — ABNORMAL HIGH (ref 8–23)
BUN: 100 mg/dL — ABNORMAL HIGH (ref 8–23)
CO2: 49 mmol/L — ABNORMAL HIGH (ref 22–32)
CO2: 43 mmol/L — ABNORMAL HIGH (ref 22–32)
CO2: 49 mmol/L — ABNORMAL HIGH (ref 22–32)
Calcium: 13.5 mg/dL (ref 8.9–10.3)
Calcium: 12.8 mg/dL — ABNORMAL HIGH (ref 8.9–10.3)
Calcium: 12.5 mg/dL — ABNORMAL HIGH (ref 8.9–10.3)
Chloride: 101 mmol/L (ref 98–111)
Chloride: 99 mmol/L (ref 98–111)
Chloride: 103 mmol/L (ref 98–111)
Creatinine, Ser: 2.1 mg/dL — ABNORMAL HIGH (ref 0.61–1.24)
Creatinine, Ser: 2.24 mg/dL — ABNORMAL HIGH (ref 0.61–1.24)
Creatinine, Ser: 2.2 mg/dL — ABNORMAL HIGH (ref 0.61–1.24)
GFR, Estimated: 34 mL/min — ABNORMAL LOW (ref >60)
GFR, Estimated: 32 mL/min — ABNORMAL LOW (ref >60)
GFR, Estimated: 32 mL/min — ABNORMAL LOW (ref >60)
Glucose, Bld: 208 mg/dL — ABNORMAL HIGH (ref 70–99)
Glucose, Bld: 186 mg/dL — ABNORMAL HIGH (ref 70–99)
Glucose, Bld: 211 mg/dL — ABNORMAL HIGH (ref 70–99)
Potassium: >7.5 mmol/L (ref 3.5–5.1)
Potassium: >7.5 mmol/L (ref 3.5–5.1)
Potassium: >7.5 mmol/L (ref 3.5–5.1)
Sodium: 155 mmol/L — ABNORMAL HIGH (ref 135–145)
Sodium: 149 mmol/L — ABNORMAL HIGH (ref 135–145)
Sodium: 150 mmol/L — ABNORMAL HIGH (ref 135–145)

## 2020-11-17 LAB — GLUCOSE, CAPILLARY
Glucose-Capillary: 171 mg/dL — ABNORMAL HIGH (ref 70–99)
Glucose-Capillary: 204 mg/dL — ABNORMAL HIGH (ref 70–99)
Glucose-Capillary: 160 mg/dL — ABNORMAL HIGH (ref 70–99)
Glucose-Capillary: 156 mg/dL — ABNORMAL HIGH (ref 70–99)
Glucose-Capillary: 178 mg/dL — ABNORMAL HIGH (ref 70–99)
Glucose-Capillary: 206 mg/dL — ABNORMAL HIGH (ref 70–99)

## 2020-11-17 LAB — BLOOD GAS, ARTERIAL
Acid-Base Excess: 13.9 mmol/L — ABNORMAL HIGH (ref 0.0–2.0)
Acid-Base Excess: 15.4 mmol/L — ABNORMAL HIGH (ref 0.0–2.0)
Bicarbonate: 45 mmol/L — ABNORMAL HIGH (ref 20.0–28.0)
Bicarbonate: 44.9 mmol/L — ABNORMAL HIGH (ref 20.0–28.0)
FIO2: 0.9
FIO2: 100
O2 Saturation: 94.2 %
O2 Saturation: 80.3 %
Patient temperature: 98.6
Patient temperature: 98.6
pCO2 arterial: >120 mmHg (ref 32.0–48.0)
pCO2 arterial: 122 mmHg (ref 32.0–48.0)
pH, Arterial: 7.129 — CL (ref 7.350–7.450)
pH, Arterial: 7.191 — CL (ref 7.350–7.450)
pO2, Arterial: 75.6 mmHg — ABNORMAL LOW (ref 83.0–108.0)
pO2, Arterial: 50.6 mmHg — ABNORMAL LOW (ref 83.0–108.0)

## 2020-11-17 LAB — CBC
HCT: 30.9 % — ABNORMAL LOW (ref 39.0–52.0)
Hemoglobin: 8 g/dL — ABNORMAL LOW (ref 13.0–17.0)
MCH: 29.2 pg (ref 26.0–34.0)
MCHC: 25.9 g/dL — ABNORMAL LOW (ref 30.0–36.0)
MCV: 112.8 fL — ABNORMAL HIGH (ref 80.0–100.0)
Platelets: 324 10*3/uL (ref 150–400)
RBC: 2.74 MIL/uL — ABNORMAL LOW (ref 4.22–5.81)
RDW: 16.3 % — ABNORMAL HIGH (ref 11.5–15.5)
WBC: 30.5 10*3/uL — ABNORMAL HIGH (ref 4.0–10.5)
nRBC: 0.2 % (ref 0.0–0.2)

## 2020-11-17 LAB — ECHOCARDIOGRAM COMPLETE
Area-P 1/2: 4.36 cm2
Calc EF: 55.4 %
Height: 65 in
S' Lateral: 3.1 cm
Single Plane A2C EF: 58.6 %
Single Plane A4C EF: 51.2 %
Weight: 2462.1 oz

## 2020-11-17 LAB — TROPONIN I (HIGH SENSITIVITY): Troponin I (High Sensitivity): 75 ng/L — ABNORMAL HIGH (ref <18)

## 2020-11-17 MED ORDER — DEXTROSE 50 % IV SOLN
50.0000 mL | Freq: Once | INTRAVENOUS | Status: AC
  Administered 2020-11-17: 50 mL via INTRAVENOUS
  Filled 2020-11-17: qty 50

## 2020-11-17 MED ORDER — CHLORHEXIDINE GLUCONATE CLOTH 2 % EX PADS
6.0000 | MEDICATED_PAD | Freq: Every day | CUTANEOUS | Status: DC
  Administered 2020-11-17 – 2020-11-18 (×2): 6 via TOPICAL

## 2020-11-17 MED ORDER — SODIUM CHLORIDE 0.45 % IV SOLN: INTRAVENOUS | Status: DC

## 2020-11-17 MED ORDER — SODIUM BICARBONATE 8.4 % IV SOLN
50.0000 meq | Freq: Once | INTRAVENOUS | Status: AC
  Administered 2020-11-17: 50 meq via INTRAVENOUS
  Filled 2020-11-17: qty 50

## 2020-11-17 MED ORDER — INSULIN ASPART 100 UNIT/ML IV SOLN
10.0000 [IU] | Freq: Once | INTRAVENOUS | Status: AC
  Administered 2020-11-17: 10 [IU] via INTRAVENOUS

## 2020-11-17 MED ORDER — SODIUM CHLORIDE 0.9 % IV SOLN: INTRAVENOUS | Status: DC

## 2020-11-17 MED ORDER — SODIUM ZIRCONIUM CYCLOSILICATE 10 G PO PACK: 10.0000 g | PACK | Freq: Once | ORAL | Status: DC

## 2020-11-17 MED ORDER — SODIUM CHLORIDE 0.9 % IV SOLN: 2.0000 g | Freq: Two times a day (BID) | INTRAVENOUS | Status: DC

## 2020-11-17 MED ORDER — SODIUM CHLORIDE 0.9 % IV SOLN: INTRAVENOUS | Status: DC | PRN

## 2020-11-17 MED ORDER — SODIUM CHLORIDE 0.45 % IV BOLUS
500.0000 mL | Freq: Once | INTRAVENOUS | Status: AC
  Administered 2020-11-17: 500 mL via INTRAVENOUS

## 2020-11-17 MED ORDER — SODIUM POLYSTYRENE SULFONATE 15 GM/60ML PO SUSP
45.0000 g | Freq: Once | ORAL | Status: AC
  Administered 2020-11-17: 45 g
  Filled 2020-11-17: qty 180

## 2020-11-17 MED ORDER — INSULIN ASPART 100 UNIT/ML IJ SOLN
0.0000 [IU] | INTRAMUSCULAR | Status: DC
  Administered 2020-11-17 (×3): 4 [IU] via SUBCUTANEOUS
  Administered 2020-11-18 (×2): 3 [IU] via SUBCUTANEOUS
  Administered 2020-11-18: 7 [IU] via SUBCUTANEOUS

## 2020-11-17 MED ORDER — LACTATED RINGERS IV SOLN: INTRAVENOUS | Status: DC

## 2020-11-17 MED ORDER — FREE WATER
200.0000 mL | Status: DC
  Administered 2020-11-17 – 2020-11-18 (×5): 200 mL

## 2020-11-17 MED ORDER — SODIUM ZIRCONIUM CYCLOSILICATE 10 G PO PACK
10.0000 g | PACK | Freq: Once | ORAL | Status: AC
  Administered 2020-11-17: 10 g
  Filled 2020-11-17: qty 1

## 2020-11-17 MED ORDER — METHYLPREDNISOLONE SODIUM SUCC 40 MG IJ SOLR
40.0000 mg | Freq: Every day | INTRAMUSCULAR | Status: DC
  Administered 2020-11-17 – 2020-11-18 (×2): 40 mg via INTRAVENOUS
  Filled 2020-11-17 (×2): qty 1

## 2020-11-17 NOTE — Progress Notes (Addendum)
NAME:  Steve [name], MRN:  [id number], DOB:  09/11/1955, LOS: 66 ADMISSION DATE:  12/03/2020, CONSULTATION DATE:  8/1 REFERRING MD:  Dr. Colin Mulders, CHIEF COMPLAINT:  Increased vent use   Brief History:   65 y/o M who presented to [facility] ER on 8/1 with reports of increased vent use over the preceding 4 days and desaturations.    The patient is followed by Dr. Marin Olp for Hodgin's Lymphoma and was deemed not a candidate for further treatment due to his functional status. He was last reviewed on 11/03/20 with plan for follow up in 4 weeks to reassess functional status. He has had a prolonged chronic critical illness since March of 2021 - including lymphoma, COVID, ARDS thought multifactorial in the setting of COVID and possible pneumonitis from immunotherapy, chronic trach/vent, multiple chest tubes, malnutrition, bacteremia, frequent readmissions and failure to thrive.   He was  admitted from 6/11 - 7/18 for poor appetite and malaise.  Work up at that time found him to be positive for staph epidermidis bacteremia.  He was seen by ID with a discharge plan of 6 weeks of IV antibiotics (first negative blood culture 09/22/20).    His wife reported the patient has been requiring the home vent consistently over the last four days PTA.  He previously had been on the ATC during the day times.  He has home health that come in to assist in his care 2x per week.  His wife and the home care RN noted he used the urinal on 8/1 and desaturated into 80's. They re-checked the sats via home health RN's meter and they were in the 50's.  EMS was activated. On ER arrival, he was noted to be somnolent on the vent and lethargic.  ABG evaluation pending. Most recent ABG in July 7.318 / 88.   CXR review showed known severe diffuse bilateral airspace disease, moderate right pleural effusion. Wife reported he had not had his midodrine until he got to the ER.  Initial labs notable for CO2 of 42, glucose 172, BUN 28 / Sr Cr 0.85 (up from  0.5), albumin 2.1, lactic acid 4.1, WBC 26.6, Hgb 9.4, and platelets 306.    PCCM consulted for evaluation of hypoxic respiratory failure.   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 Down to 60% PEEP 10  8/7 worse gas exchange, FI02 up to 100%. Much more comfortable on MS scheduled but arouses to verbal   Interim History / Subjective:  RN reports ongoing leak from trach / positional  Tmax 99.5  No significant change in mental status   Objective   Blood pressure (!) 126/59, pulse (!) 106, temperature 99.5 F (37.5 C), temperature source Oral, resp. rate (!) 21, height 5\' 5"  (1.651 m), weight 69.8 kg, SpO2 94 %.    Vent Mode: PRVC FiO2 (%):  [90 %-100 %] 90 % Set Rate:  [14 bmp] 14 bmp Vt Set:  [490 mL] 490 mL PEEP:  [10 cmH20] 10 cmH20 Plateau Pressure:  [22 cmH20-40 cmH20] 38 cmH20   Intake/Output Summary (  Last 24 hours) at 11/17/2020 0748 Last data filed at 11/17/2020 0534 Gross per 24 hour  Intake 2319.45 ml  Output 900 ml  Net 1419.45 ml   Filed Weights   11/15/20 0500 11/16/20 0500 11/17/20 0354  Weight: 68.7 kg 69.8 kg 69.8 kg    Examination: General: chronically ill appearing adult male lying in bed in NAD on vent HEENT: MM pink/moist, trach midline - positional at times with mild leak, anicteric  Neuro: eyes open but does not respond to voice CV: s1s2 RRR, ?new holosystolic murmur noted, not previously noted on prior exams PULM: non-labored on vent, lungs bilaterally with  GI: soft, bsx4 active  Extremities: warm/dry, trace generalized dependent edema   Skin: patchy dry areas erythema on face   Resolved Hospital Problem list   Staph Epi Bacteremia   Assessment & Plan:   Acute on Chronic Hypoxic and Hypercarbic Respiratory Failure 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  -meropenem as below, stop date in place  -trach care per protocol  -reduce solumedrol to 40 mg QD  -PRVC 8cc/kg, unable to wean  -follow intermittent CXR  -PRN morphine for air hunger, pain   Severe Sepsis due to Pseudomonas Aeruginosa HCAP Trach asp 8/1 with few Psuedomonas Aeruginosa. There was no Pseudomonas on 5/22 resp cx.  Finished abx course 7/26 for staph epi bacteremia -meropenem D6/7 for pan sensitive pseudomonas   Chronic Hypotension  -midodrine 10 mg TID   New Murmur  Not previously noted on prior exams, no valvular abnormalities on prior ECHO -assess EKG, troponin  -repeat ECHO   AKI  Hyperkalemia -free water PT  -BMP now  -Trend BMP / urinary output -Replace electrolytes as indicated -Avoid nephrotoxic agents, ensure adequate renal perfusion  Anemia of Chronic Disease  -trend CBC -transfuse for Hgb <7% or active bleeding   DM II with Steroid Induced Hyperglycemia  -SSI, moderate scale   Hodgkin's lymphoma, relapsed  -no further options per Dr. Marin Olp given functional score   FTT Severe protein calorie malnutrition -TF per Nutrition    Depression, Anxiety  -ativan, zoloft  Dermatitis -Pepcid BID, benadryl cream  -PRN benadryl IV   DNR Status Encounter for Palliative Care  -DNR on admission  -ongoing [place] discussion, treat the treatable for now  -PRN morphine for pain, SOB  Chronic Conditions Present on Admit -chronic hypoxic hypercarbic respiratory failure -tracheostomy status, VDRF  -post-covid pulmonary fibrosis (UIP) -Hodgkin's lymphoma with relapse, without  further tx options -anemia of chronic dz -severe protein calorie malnutrition,  - severe cachexia - Failure to Thrive; FTT  -mood disorder: depression, anxiety  -chronic hypotension on midodrine -soft tissue pressure injury -DM2 -DNR status  -pruritis   Best Practice (right click and "Reselect all SmartList Selections" daily)  Diet/type: tubefeeds DVT prophylaxis: prophylactic heparin  GI prophylaxis: PPI Lines: N/A Foley:  N/A Code Status:  DNR Last date of multidisciplinary goals of care discussion: DNR (POA).    Wife updated on plan of care via phone 8/9.    Noe Gens, MSN, APRN, NP-C, AGACNP-BC [place] [facility] 11/17/2020, 7:48 AM   Please see [email or web address] for pager details.   From 7A-7P if no response, please call [phone] After hours, please call ELink [phone]

## 2020-11-17 NOTE — Progress Notes (Addendum)
eLink Physician-Brief Progress Note Patient Name: CARDEN TEEL DOB: December 21, 1955 MRN: [id number]   Date of Service  11/17/2020  HPI/Events of Note  ABG on 80%/PRVC 18/TV 490/P 10 = 7.129/>120?75.6/45.0. PRVC rate increased to 24 by PCCM rounding team.   eICU Interventions  Plan: Repeat ABG at 8 PM.     Intervention Category Major Interventions: Acid-Base disturbance - evaluation and management;Respiratory failure - evaluation and management  [name] 11/17/2020, 7:24 PM

## 2020-11-17 NOTE — TOC Progression Note (Signed)
Transition of Care [facility]) - Progression Note    Patient Details  Name: Steve [name] MRN: [id number] Date of Birth: [birth year]  Transition of Care [facility]) CM/SW Contact  Leeroy Cha, RN Phone Number: 11/17/2020, 7:29 AM  Clinical Narrative:    Significant Hospital Events: Including procedures, antibiotic start and stop dates in addition to other pertinent events  8/1 Admit with AMS -> cuff leak with size 6 trach with distal end was 7cm above carina and stoma too big -> chagned to 6 XLT and distal end 4cm above carina and less leak 8/4 weaning vent. Down to 60% PEEP 10 8/7 worse gas exchange, FI02 up to 100%. Much more comfortable on MS scheduled but arouses to verbal   Interim History / Subjective:  RN reports pt breathing appears more comfortable but does not wake to voice, on scheduled morphine Afebrile I/O 450 ml UOP, +1.8L in last 24 hours Sed rate remains elevated  TOC PLAN OF CARE: encourage md to set goc with family. Patient is stable at the present. Following for progression, remains on the vent via trach.  Expected Discharge Plan: [place] Barriers to Discharge: Continued Medical Work up  Expected Discharge Plan and Services Expected Discharge Plan: [place]   Discharge Planning Services: CM Consult   Living arrangements for the past 2 months: Mobile Home                                       Social Determinants of Health (SDOH) Interventions    Readmission Risk Interventions Readmission Risk Prevention Plan 09/22/2020 08/24/2020 07/22/2019  Transportation Screening Complete Complete Complete  Medication Review Press photographer) Complete Complete Complete  HRI or Home Care Consult Complete Complete -  SW Recovery Care/Counseling Consult Complete Complete -  Palliative Care Screening Not Applicable Not Applicable -  [place] Not Applicable Not Applicable -  Some recent data might be hidden

## 2020-11-17 NOTE — Progress Notes (Signed)
  Echocardiogram 2D Echocardiogram has been performed.  Steve [name] 11/17/2020, 2:52 PM

## 2020-11-17 NOTE — Progress Notes (Signed)
eLink Physician-Brief Progress Note Patient Name: Steve [name] DOB: Aug 13, 1955 MRN: [id number]   Date of Service  11/17/2020  HPI/Events of Note  ABG on 100%/PRVC 24/TV 490/P 10 = 7.19/122/50.6/44.9. Minimal improvement in pCO2 with increase in PRVC rate. Ppeak already in 40's limits ability to increase rate further. Sat by oximetry = 96%. Very little effective therapy to offer this unfortunate gentleman. Prognosis is extremely poor.  eICU Interventions  Continue present ventilator management.      Intervention Category Major Interventions: Acid-Base disturbance - evaluation and management;Respiratory failure - evaluation and management  [name] 11/17/2020, 8:55 PM

## 2020-11-17 NOTE — Progress Notes (Signed)
Palliative:  Discussed Steve [name]'s care with Noe Gens PCCM NP. He has severe electrolyte imbalance and has declined. He is requiring full vent support 90% FiO2 and is not responding. Wife has been updated by Noe Gens today and I will reach out to wife tomorrow morning for support.   No charge  Vinie Sill, NP Palliative Medicine Team Pager [phone] (Please see [email or web address] for schedule) Team Phone [phone]

## 2020-11-17 NOTE — Progress Notes (Signed)
eLink Physician-Brief Progress Note Patient Name: Steve [name] DOB: 1956/03/22 MRN: [id number]   Date of Service  11/17/2020  HPI/Events of Note  Diarrhea d/t Kayexalate. Nursing request for Flexiseal.   eICU Interventions  Will place Flexiseal.     Intervention Category Major Interventions: Other:  [name] 11/17/2020, 8:09 PM

## 2020-11-17 NOTE — Progress Notes (Signed)
Pharmacy Antibiotic Note  Steve [name] is a 65 y.o. male with prolonged critical illness admitted on 11/12/2020 with hypoxic respiratory failure.  Pharmacy has been consulted for meropenem dosing for pseudomonas HAP.  Day #6 meropenem  - Tm 99.5 - WBC up to 30.5 - SCr up 2.1, CrCl ~30 ml/min  Plan: Decrease to meropenem 2g IV q12h Follow up renal function & cultures, duration of therapy  Height: 5\' 5"  (165.1 cm) Weight: 69.8 kg (153 lb 14.1 oz) (yesterday's weight; bed not working; CN checked as well) IBW/kg (Calculated) : 61.5  Temp (24hrs), Avg:99 F (37.2 C), Min:98.5 F (36.9 C), Max:99.5 F (37.5 C)  Recent Labs  Lab 11/11/20 0330 11/12/20 0435 11/13/20 0500 11/14/20 0345 11/17/20 0331 11/17/20 1009  WBC 19.7* 19.3* 20.7* 23.3* 30.5*  --   CREATININE 0.94 0.93 1.05 1.20  --  2.10*     Estimated Creatinine Clearance: 30.5 mL/min (A) (by C-G formula based on SCr of 2.1 mg/dL (H)).    No Known Allergies  Antimicrobials this admission: Cefepime 8/1 >> 8/4 Vanc 8/1 >> 8/2 Merrem 8/4 >>   Microbiology results: 8/1 BCx: ngtd 8/1 Sputum: pseudomonas aeruginosa (S cipro, ceftaz, imi, gent) 8/1 MRSA PCR: neg 8/2 Legionella: neg 8/2 strep pneumo urine ag: neg  Thank you for allowing pharmacy to be a part of this patient's care.  Gretta Arab PharmD, BCPS Clinical Pharmacist [facility] main pharmacy [phone] 11/17/2020 2:50 PM

## 2020-11-17 NOTE — Progress Notes (Signed)
S:  Called regarding lab abnormalities on BMP.    O: BMP Latest Ref Rng & Units 11/17/2020 11/14/2020 11/13/2020  Glucose 70 - 99 mg/dL 208(H) 144(H) 161(H)  BUN 8 - 23 mg/dL 99(H) 51(H) 47(H)  Creatinine 0.61 - 1.24 mg/dL 2.10(H) 1.20 1.05  Sodium 135 - 145 mmol/L 155(H) 142 146(H)  Potassium 3.5 - 5.1 mmol/L >7.5(HH) 5.0 4.5  Chloride 98 - 111 mmol/L 101 98 104  CO2 22 - 32 mmol/L 49(H) 39(H) 38(H)  Calcium 8.9 - 10.3 mg/dL 13.5(HH) 11.3(H) 11.1(H)    A: Hyperkalemia Hypernatremia Hypercalcemia   P: -1/2NS 561ml bolus then infusion at 125ml/hr -now dextrose + 10 units IV insulin  -lokelma now and this afternoon  -EKG reviewed, mild peak T's but no significant changes with elevated K -follow up BMP for 1600 -unable to give bicarbonate / calcium due to elevated levels of Na/Ca+ -discontinue PICC line, get PIV  -obtain VBG   Noe Gens, MSN, APRN, NP-C, AGACNP-BC [place] [facility] 11/17/2020, 11:46 AM   Please see [email or web address] for pager details.   From 7A-7P if no response, please call [phone] After hours, please call ELink [phone]

## 2020-11-17 NOTE — Progress Notes (Signed)
S: Notified by RN of repeat BMP with K >7.5 despite interventions.  O:  BMP Latest Ref Rng & Units 11/17/2020 11/17/2020 11/14/2020  Glucose 70 - 99 mg/dL 186(H) 208(H) 144(H)  BUN 8 - 23 mg/dL PENDING 99(H) 51(H)  Creatinine 0.61 - 1.24 mg/dL 2.24(H) 2.10(H) 1.20  Sodium 135 - 145 mmol/L 149(H) 155(H) 142  Potassium 3.5 - 5.1 mmol/L >7.5(HH) >7.5(HH) 5.0  Chloride 98 - 111 mmol/L 99 101 98  CO2 22 - 32 mmol/L 43(H) 49(H) 39(H)  Calcium 8.9 - 10.3 mg/dL 12.8(H) 13.5(HH) 11.3(H)   A: Hyperkalemia Refractory Hypercarbic Respiratory Failure Acute Renal Failure Mixed Metabolic and Respiratory Acidosis   P: -repeat BMP at 2200  -now Bicarbonate  -now kayexalate -now insulin -now dextrose    Wife called and updated on findings, events of the afternoon.  She indicates understanding. Impressed upon her that we are doing everything we can to support him but I am concerned he may not survive the night given electrolyte disturbances.  She indicates understanding and will visit in am.      Noe Gens, MSN, APRN, NP-C, AGACNP-BC [facility] 11/17/2020, 6:42 PM   Please see [email or web address] for pager details.   From 7A-7P if no response, please call [phone] After hours, please call ELink [phone]

## 2020-11-18 ENCOUNTER — Inpatient Hospital Stay (HOSPITAL_COMMUNITY): Payer: BC Managed Care – PPO

## 2020-11-18 DIAGNOSIS — J9621 Acute and chronic respiratory failure with hypoxia: Secondary | ICD-10-CM | POA: Diagnosis not present

## 2020-11-18 DIAGNOSIS — J189 Pneumonia, unspecified organism: Secondary | ICD-10-CM | POA: Diagnosis not present

## 2020-11-18 DIAGNOSIS — J8410 Pulmonary fibrosis, unspecified: Secondary | ICD-10-CM | POA: Diagnosis not present

## 2020-11-18 DIAGNOSIS — E871 Hypo-osmolality and hyponatremia: Secondary | ICD-10-CM | POA: Diagnosis not present

## 2020-11-18 DIAGNOSIS — E875 Hyperkalemia: Secondary | ICD-10-CM | POA: Diagnosis not present

## 2020-11-18 DIAGNOSIS — R627 Adult failure to thrive: Secondary | ICD-10-CM

## 2020-11-18 LAB — CBC
HCT: 25.7 % — ABNORMAL LOW (ref 39.0–52.0)
Hemoglobin: 6.8 g/dL — CL (ref 13.0–17.0)
MCH: 29.2 pg (ref 26.0–34.0)
MCHC: 26.5 g/dL — ABNORMAL LOW (ref 30.0–36.0)
MCV: 110.3 fL — ABNORMAL HIGH (ref 80.0–100.0)
Platelets: 210 10*3/uL (ref 150–400)
RBC: 2.33 MIL/uL — ABNORMAL LOW (ref 4.22–5.81)
RDW: 16.5 % — ABNORMAL HIGH (ref 11.5–15.5)
WBC: 22.3 10*3/uL — ABNORMAL HIGH (ref 4.0–10.5)
nRBC: 0.1 % (ref 0.0–0.2)

## 2020-11-18 LAB — BASIC METABOLIC PANEL
Anion gap: 10 (ref 5–15)
BUN: 81 mg/dL — ABNORMAL HIGH (ref 8–23)
CO2: 45 mmol/L — ABNORMAL HIGH (ref 22–32)
Calcium: 12.6 mg/dL — ABNORMAL HIGH (ref 8.9–10.3)
Chloride: 98 mmol/L (ref 98–111)
Creatinine, Ser: 2.28 mg/dL — ABNORMAL HIGH (ref 0.61–1.24)
GFR, Estimated: 31 mL/min — ABNORMAL LOW (ref >60)
Glucose, Bld: 176 mg/dL — ABNORMAL HIGH (ref 70–99)
Potassium: 6.5 mmol/L (ref 3.5–5.1)
Sodium: 153 mmol/L — ABNORMAL HIGH (ref 135–145)

## 2020-11-18 LAB — GLUCOSE, CAPILLARY
Glucose-Capillary: 149 mg/dL — ABNORMAL HIGH (ref 70–99)
Glucose-Capillary: 133 mg/dL — ABNORMAL HIGH (ref 70–99)

## 2020-11-18 LAB — COMPREHENSIVE METABOLIC PANEL
ALT: 21 U/L (ref 0–44)
AST: 17 U/L (ref 15–41)
Albumin: 2.5 g/dL — ABNORMAL LOW (ref 3.5–5.0)
Alkaline Phosphatase: 65 U/L (ref 38–126)
Anion gap: 5 (ref 5–15)
BUN: 114 mg/dL — ABNORMAL HIGH (ref 8–23)
CO2: 49 mmol/L — ABNORMAL HIGH (ref 22–32)
Calcium: 12.8 mg/dL — ABNORMAL HIGH (ref 8.9–10.3)
Chloride: 100 mmol/L (ref 98–111)
Creatinine, Ser: 2.16 mg/dL — ABNORMAL HIGH (ref 0.61–1.24)
GFR, Estimated: 33 mL/min — ABNORMAL LOW (ref >60)
Glucose, Bld: 147 mg/dL — ABNORMAL HIGH (ref 70–99)
Potassium: 6.8 mmol/L (ref 3.5–5.1)
Sodium: 154 mmol/L — ABNORMAL HIGH (ref 135–145)
Total Bilirubin: 0.5 mg/dL (ref 0.3–1.2)
Total Protein: 6.3 g/dL — ABNORMAL LOW (ref 6.5–8.1)

## 2020-11-18 LAB — BLOOD GAS, ARTERIAL
Acid-Base Excess: 15.9 mmol/L — ABNORMAL HIGH (ref 0.0–2.0)
Bicarbonate: 46.7 mmol/L — ABNORMAL HIGH (ref 20.0–28.0)
Drawn by: 22052
FIO2: 100
MECHVT: 490 mL
O2 Saturation: 88.7 %
PEEP: 10 cmH2O
Patient temperature: 98.1
RATE: 24 resp/min
pCO2 arterial: >120 mmHg (ref 32.0–48.0)
pH, Arterial: 7.188 — CL (ref 7.350–7.450)
pO2, Arterial: 62.5 mmHg — ABNORMAL LOW (ref 83.0–108.0)

## 2020-11-18 LAB — PREPARE RBC (CROSSMATCH)

## 2020-11-18 MED ORDER — FREE WATER
200.0000 mL | Status: DC
  Administered 2020-11-18 (×3): 200 mL

## 2020-11-18 MED ORDER — HALOPERIDOL LACTATE 5 MG/ML IJ SOLN: 0.5000 mg | INTRAMUSCULAR | Status: DC | PRN

## 2020-11-18 MED ORDER — GLYCOPYRROLATE 0.2 MG/ML IJ SOLN
0.2000 mg | INTRAMUSCULAR | Status: DC | PRN
  Filled 2020-11-18: qty 1

## 2020-11-18 MED ORDER — CALCIUM GLUCONATE-NACL 1-0.675 GM/50ML-% IV SOLN
1.0000 g | Freq: Once | INTRAVENOUS | Status: AC
  Administered 2020-11-18: 1000 mg via INTRAVENOUS
  Filled 2020-11-18: qty 50

## 2020-11-18 MED ORDER — HALOPERIDOL LACTATE 2 MG/ML PO CONC
0.5000 mg | ORAL | Status: DC | PRN
  Filled 2020-11-18: qty 0.3

## 2020-11-18 MED ORDER — METOPROLOL TARTRATE 5 MG/5ML IV SOLN
INTRAVENOUS | Status: AC
  Administered 2020-11-18: 2.5 mg via INTRAVENOUS
  Filled 2020-11-18: qty 5

## 2020-11-18 MED ORDER — FUROSEMIDE 10 MG/ML IJ SOLN
40.0000 mg | Freq: Once | INTRAMUSCULAR | Status: AC
  Administered 2020-11-18: 40 mg via INTRAVENOUS
  Filled 2020-11-18: qty 4

## 2020-11-18 MED ORDER — ONDANSETRON HCL 4 MG/2ML IJ SOLN: 4.0000 mg | Freq: Four times a day (QID) | INTRAMUSCULAR | Status: DC | PRN

## 2020-11-18 MED ORDER — MORPHINE SULFATE (PF) 2 MG/ML IV SOLN
1.0000 mg | Freq: Once | INTRAVENOUS | Status: AC
  Administered 2020-11-18: 1 mg via INTRAVENOUS
  Filled 2020-11-18: qty 1

## 2020-11-18 MED ORDER — LORAZEPAM 2 MG/ML IJ SOLN
1.0000 mg | INTRAMUSCULAR | Status: DC | PRN
Start: 2020-11-18 — End: 2020-11-18
  Administered 2020-11-18: 2 mg via INTRAVENOUS
  Filled 2020-11-18: qty 1

## 2020-11-18 MED ORDER — BIOTENE DRY MOUTH MT LIQD: 15.0000 mL | OROMUCOSAL | Status: DC | PRN

## 2020-11-18 MED ORDER — SODIUM BICARBONATE 8.4 % IV SOLN
100.0000 meq | Freq: Once | INTRAVENOUS | Status: AC
  Administered 2020-11-18: 100 meq via INTRAVENOUS
  Filled 2020-11-18: qty 100

## 2020-11-18 MED ORDER — ONDANSETRON 4 MG PO TBDP: 4.0000 mg | ORAL_TABLET | Freq: Four times a day (QID) | ORAL | Status: DC | PRN

## 2020-11-18 MED ORDER — METOPROLOL TARTRATE 5 MG/5ML IV SOLN: 2.5000 mg | Freq: Once | INTRAVENOUS | Status: AC

## 2020-11-18 MED ORDER — HALOPERIDOL 1 MG PO TABS: 0.5000 mg | ORAL_TABLET | ORAL | Status: DC | PRN

## 2020-11-18 MED ORDER — GLYCOPYRROLATE 0.2 MG/ML IJ SOLN: 0.2000 mg | INTRAMUSCULAR | Status: DC | PRN

## 2020-11-18 MED ORDER — POLYVINYL ALCOHOL 1.4 % OP SOLN
1.0000 [drp] | Freq: Four times a day (QID) | OPHTHALMIC | Status: DC | PRN
  Filled 2020-11-18: qty 15

## 2020-11-18 MED ORDER — SODIUM CHLORIDE 0.9 % IV SOLN
2.0000 g | Freq: Two times a day (BID) | INTRAVENOUS | Status: DC
  Filled 2020-11-18: qty 2

## 2020-11-18 MED ORDER — GLYCOPYRROLATE 0.2 MG/ML IJ SOLN
0.2000 mg | Freq: Three times a day (TID) | INTRAMUSCULAR | Status: DC
  Administered 2020-11-18: 0.2 mg via INTRAVENOUS

## 2020-11-18 MED ORDER — MORPHINE 100MG IN NS 100ML (1MG/ML) PREMIX INFUSION
2.0000 mg/h | INTRAVENOUS | Status: DC
  Administered 2020-11-18: 2 mg/h via INTRAVENOUS
  Filled 2020-11-18: qty 100

## 2020-11-18 MED ORDER — DEXTROSE 50 % IV SOLN
1.0000 | Freq: Once | INTRAVENOUS | Status: AC
  Administered 2020-11-18: 50 mL via INTRAVENOUS
  Filled 2020-11-18: qty 50

## 2020-11-18 MED ORDER — GLYCOPYRROLATE 1 MG PO TABS: 1.0000 mg | ORAL_TABLET | ORAL | Status: DC | PRN

## 2020-11-18 MED ORDER — INSULIN ASPART 100 UNIT/ML IV SOLN
10.0000 [IU] | Freq: Once | INTRAVENOUS | Status: AC
  Administered 2020-11-18: 10 [IU] via INTRAVENOUS

## 2020-11-18 MED ORDER — MORPHINE BOLUS VIA INFUSION
1.0000 mg | INTRAVENOUS | Status: DC | PRN
  Filled 2020-11-18: qty 4

## 2020-11-18 MED ORDER — SODIUM CHLORIDE 0.9% IV SOLUTION: Freq: Once | INTRAVENOUS | Status: DC

## 2020-11-18 MED ORDER — GERHARDT'S BUTT CREAM
TOPICAL_CREAM | Freq: Two times a day (BID) | CUTANEOUS | Status: DC | PRN
  Filled 2020-11-18: qty 1

## 2020-11-18 MED ORDER — SODIUM ZIRCONIUM CYCLOSILICATE 10 G PO PACK
10.0000 g | PACK | Freq: Three times a day (TID) | ORAL | Status: DC
  Administered 2020-11-18 (×2): 10 g
  Filled 2020-11-18 (×2): qty 1

## 2020-11-19 LAB — TYPE AND SCREEN
ABO/RH(D): A NEG
Antibody Screen: NEGATIVE
Unit division: 0

## 2020-11-19 LAB — BPAM RBC
Blood Product Expiration Date: 202208292359
ISSUE DATE / TIME: 202208100729
Unit Type and Rh: 600

## 2020-11-22 LAB — CULTURE, BLOOD (ROUTINE X 2)
Culture: NO GROWTH
Culture: NO GROWTH
Special Requests: ADEQUATE
Special Requests: ADEQUATE

## 2020-12-09 ENCOUNTER — Telehealth: Payer: Self-pay

## 2020-12-09 NOTE — Telephone Encounter (Signed)
Received medical record request from [place]. I faxed back that he is not a patient of ours-Toni

## 2020-12-10 NOTE — Death Summary Note (Signed)
DEATH SUMMARY   Patient Details  Name: Steve [name] MRN: [id number] DOB: 08/19/1955  Admission/Discharge Information   Admit Date:  Dec 04, 2020  Date of Death: Date of Death: 12/13/20  Time of Death: Time of Death: 07/26/45  Length of Stay: 2022-07-13  Referring Physician: [name], PA-C   Reason(s) for Hospitalization  Was brought into the hospital with increased fatigue, shortness of breath, tiredness, requiring the ventilator persistently Diagnoses  Preliminary cause of death:  Acute on chronic hypercapnic respiratory failure COVID related pulmonary fibrosis  Secondary Diagnoses (including complications and co-morbidities):  Active Problems:   Hyponatremia   HCAP (healthcare-associated pneumonia)   Tracheostomy dependence ([place])   Acute on chronic respiratory failure with hypoxia and hypercapnia ([name])   Hyperkalemia Hodgkin's lymphoma Post COVID pulmonary fibrosis Acute respiratory distress syndrome Pseudomonas pneumonia Type 2 diabetes Severe protein calorie malnutrition Depression Anxiety  Brief Hospital Course (including significant findings, care, treatment, and services provided and events leading to death)  Steve [name] is a 65 y.o. year old male who was admitted with worsening fatigue, inability to use his trach collar,ventilator dependence. He has had multiple recent hospitalizations for respiratory failure, pulmonary infections that were successfully treated. Prior to coming into the hospital was not able to stay off the ventilator Initial evaluation did reveal acute on chronic hypercarbic respiratory failure, tracheobronchitis, culture positive for Pseudomonas -Treated with antibiotics He did continue to struggle with his breathing and required morphine for air hunger and increased work of breathing.  He was able to stabilize with the treatment. Morphine was being weaned however patient's respiratory status continued to worsen despite treatment for his  Pseudomonas HCAP Patient's deterioration did continue Developed acute kidney injury with hyperkalemia  Despite aggressive resuscitative efforts, multiple ventilator changes to try and ventilate him, all efforts proved abortive  With the assistance of palliative care and multiple discussions with spouse  Patient was transitioned to comfort measures only  Patient succumbed to his illness at 18 on 13-Dec-2020    Pertinent Labs and Studies  Significant Diagnostic Studies DG Chest 1 View  Result Date: 11/10/2020 CLINICAL DATA:  Pneumonia. EXAM: CHEST  1 VIEW COMPARISON:  2020/12/04. FINDINGS: Endotracheal tube and right PICC line stable position. Severe cardiomegaly again noted. Diffuse bilateral pulmonary infiltrates and moderate right pleural effusion again noted. Surgical clips right upper chest. Degenerative change thoracic spine. IMPRESSION: 1.  Tracheostomy tube and right PICC line stable position. 2.  Severe cardiomegaly again noted. 3. Diffuse bilateral pulmonary infiltrates and moderate right-sided pleural effusion again noted. Similar findings noted on prior chest x-ray. Electronically Signed   By: Marcello Moores  Register   On: 11/10/2020 07:11   DG Abd 1 View  Result Date: 12/04/2020 CLINICAL DATA:  Ileus EXAM: ABDOMEN - 1 VIEW COMPARISON:  09/24/2019 FINDINGS: Two supine frontal views of the abdomen and pelvis are obtained. Percutaneous gastrostomy tube overlies left upper quadrant. No bowel obstruction or ileus. Gas is seen throughout the small and large bowel to the level of the rectum. No masses or abnormal calcifications. Bibasilar airspace disease and right pleural effusion again noted. IMPRESSION: 1. Unremarkable bowel gas pattern. 2. Stable bilateral airspace disease and right pleural effusion. Electronically Signed   By: Randa Ngo M.D.   On: 04-Dec-2020 15:06   DG CHEST PORT 1 VIEW  Result Date: 13-Dec-2020 CLINICAL DATA:  Respiratory failure. EXAM: PORTABLE CHEST 1 VIEW  COMPARISON:  November 17, 2020. FINDINGS: Stable cardiomegaly. Tracheostomy tube is unchanged. Right-sided PICC line is been removed. Stable diffuse bilateral  lung opacities are noted with extensive pleural based density in the right hemithorax which may represent pleural thickening or possibly effusion. No pneumothorax is noted. Bony thorax is unremarkable. IMPRESSION: Stable bilateral lung opacities as described above. Stable position of tracheostomy tube. Electronically Signed   By: Marijo Conception M.D.   On: Dec 11, 2020 12:25   DG CHEST PORT 1 VIEW  Result Date: 11/17/2020 CLINICAL DATA:  Pulmonary fibrosis. EXAM: PORTABLE CHEST 1 VIEW COMPARISON:  November 13, 2020. FINDINGS: Stable cardiomegaly. Tracheostomy tube is unchanged position. Right-sided PICC line is unchanged in position. Stable bilateral lung opacities are noted consistent with pulmonary fibrosis. Extensive pleural based density is noted in the right hemithorax which may represent pleural thickening. Bony thorax is unremarkable. IMPRESSION: Stable chronic findings as described above. No significant changes noted. Electronically Signed   By: Marijo Conception M.D.   On: 11/17/2020 07:28   DG CHEST PORT 1 VIEW  Result Date: 11/13/2020 CLINICAL DATA:  Pneumonia. EXAM: PORTABLE CHEST 1 VIEW COMPARISON:  11/10/2020 FINDINGS: Extensive pleural and parenchymal densities throughout the right chest. Parenchymal densities or airspace disease in the right upper lung region may have progressed. PICC line terminates in the SVC region and stable. Again noted is a tracheostomy tube. Diffuse interstitial densities throughout the left lung are unchanged. Cardiomediastinal silhouette remains enlarged with known bulky mediastinal lymphadenopathy. IMPRESSION: 1. Possible progression of the airspace disease or parenchymal disease in the right lung. Again noted is extensive parenchymal and pleural based disease throughout the right hemithorax. 2. Interstitial densities in  left lung have not significantly changed. 3. Stable enlargement of the cardiomediastinal silhouette with known mediastinal lymphadenopathy. Electronically Signed   By: Markus Daft M.D.   On: 11/13/2020 08:08   DG Chest Portable 1 View  Result Date: 12/04/2020 CLINICAL DATA:  Shortness of breath EXAM: PORTABLE CHEST 1 VIEW COMPARISON:  09/24/2020 FINDINGS: Severe diffuse bilateral airspace disease. Moderate right pleural effusion peripherally around the right lung. There is cardiomegaly. Tracheostomy is un changed. Right PICC line in place with the tip at the cavoatrial junction. IMPRESSION: Severe diffuse bilateral airspace disease, not significantly changed since prior study. Moderate right pleural effusion. Electronically Signed   By: Rolm Baptise M.D.   On: 11/25/2020 11:49   ECHOCARDIOGRAM COMPLETE  Result Date: 11/17/2020    ECHOCARDIOGRAM REPORT   Patient Name:   Steve [name] [facility] Date of Exam: 11/17/2020 Medical Rec #:  [id number]         Height:       65.0 in Accession #:    [id number]        Weight:       153.9 lb Date of Birth:  [birth year]          BSA:          1.770 m Patient Age:    20 years          BP:           130/58 mmHg Patient Gender: M                 HR:           97 bpm. Exam Location:  Inpatient Procedure: 2D Echo, Cardiac Doppler and Color Doppler Indications:    Other cardiac sounds R01.2  History:        Patient has prior history of Echocardiogram examinations, most                 recent 09/21/2020. Risk Factors:Hypertension,  Diabetes, Sleep                 Apnea and Non-Smoker.  Sonographer:    Vickie Epley RDCS Referring Phys: [id number] Donita Brooks  Sonographer Comments: Echo performed with patient supine and on artificial respirator. IMPRESSIONS  1. Left ventricular ejection fraction, by estimation, is 60 to 65%. The left ventricle has normal function. The left ventricle has no regional wall motion abnormalities. Left ventricular diastolic parameters are consistent with Grade II  diastolic dysfunction (pseudonormalization). There is the interventricular septum is flattened in systole and diastole, consistent with right ventricular pressure and volume overload.  2. Right ventricular systolic function is normal. The right ventricular size is mildly enlarged. There is moderately elevated pulmonary artery systolic pressure. The estimated right ventricular systolic pressure is 68.1 mmHg.  3. Right atrial size was mildly dilated.  4. The mitral valve is normal in structure. No evidence of mitral valve regurgitation. No evidence of mitral stenosis.  5. Tricuspid valve regurgitation is moderate.  6. The aortic valve is normal in structure. Aortic valve regurgitation is not visualized. No aortic stenosis is present.  7. The inferior vena cava is normal in size with greater than 50% respiratory variability, suggesting right atrial pressure of 3 mmHg. Comparison(s): No significant change from prior study. Prior images reviewed side by side. FINDINGS  Left Ventricle: Left ventricular ejection fraction, by estimation, is 60 to 65%. The left ventricle has normal function. The left ventricle has no regional wall motion abnormalities. The left ventricular internal cavity size was normal in size. There is  no left ventricular hypertrophy. The interventricular septum is flattened in systole and diastole, consistent with right ventricular pressure and volume overload. Left ventricular diastolic parameters are consistent with Grade II diastolic dysfunction (pseudonormalization). Right Ventricle: The right ventricular size is mildly enlarged. No increase in right ventricular wall thickness. Right ventricular systolic function is normal. There is moderately elevated pulmonary artery systolic pressure. The tricuspid regurgitant velocity is 3.31 m/s, and with an assumed right atrial pressure of 8 mmHg, the estimated right ventricular systolic pressure is 27.5 mmHg. Left Atrium: Left atrial size was normal in size.  Right Atrium: Right atrial size was mildly dilated. Pericardium: There is no evidence of pericardial effusion. Mitral Valve: The mitral valve is normal in structure. No evidence of mitral valve regurgitation. No evidence of mitral valve stenosis. Tricuspid Valve: The tricuspid valve is normal in structure. Tricuspid valve regurgitation is moderate . No evidence of tricuspid stenosis. Aortic Valve: The aortic valve is normal in structure. Aortic valve regurgitation is not visualized. No aortic stenosis is present. Pulmonic Valve: The pulmonic valve was normal in structure. Pulmonic valve regurgitation is trivial. No evidence of pulmonic stenosis. Aorta: The aortic root is normal in size and structure. Venous: The inferior vena cava is normal in size with greater than 50% respiratory variability, suggesting right atrial pressure of 3 mmHg. IAS/Shunts: No atrial level shunt detected by color flow Doppler.  LEFT VENTRICLE PLAX 2D LVIDd:         4.60 cm      Diastology LVIDs:         3.10 cm      LV e' medial:    9.46 cm/s LV PW:         0.70 cm      LV E/e' medial:  10.6 LV IVS:        0.70 cm      LV e' lateral:   10.40 cm/s  LVOT diam:     2.10 cm      LV E/e' lateral: 9.6 LV SV:         87 LV SV Index:   49 LVOT Area:     3.46 cm  LV Volumes (MOD) LV vol d, MOD A2C: 154.0 ml LV vol d, MOD A4C: 137.0 ml LV vol s, MOD A2C: 63.7 ml LV vol s, MOD A4C: 66.9 ml LV SV MOD A2C:     90.3 ml LV SV MOD A4C:     137.0 ml LV SV MOD BP:      81.4 ml RIGHT VENTRICLE RV S prime:     15.20 cm/s TAPSE (M-mode): 2.2 cm LEFT ATRIUM             Index       RIGHT ATRIUM           Index LA diam:        3.00 cm 1.70 cm/m  RA Area:     18.80 cm LA Vol (A2C):   58.3 ml 32.95 ml/m RA Volume:   53.30 ml  30.12 ml/m LA Vol (A4C):   41.5 ml 23.45 ml/m LA Biplane Vol: 51.6 ml 29.16 ml/m  AORTIC VALVE LVOT Vmax:   170.00 cm/s LVOT Vmean:  101.000 cm/s LVOT VTI:    0.250 m  AORTA Ao Root diam: 3.60 cm Ao Asc diam:  3.10 cm MITRAL VALVE                 TRICUSPID VALVE MV Area (PHT): 4.36 cm     TR Peak grad:   43.8 mmHg MV Decel Time: 174 msec     TR Vmax:        331.00 cm/s MV E velocity: 100.00 cm/s MV A velocity: 76.70 cm/s   SHUNTS MV E/A ratio:  1.30         Systemic VTI:  0.25 m                             Systemic Diam: 2.10 cm Candee Furbish MD Electronically signed by Candee Furbish MD Signature Date/Time: 11/17/2020/3:00:43 PM    Final     Microbiology Recent Results (from the past 240 hour(s))  Blood culture (routine x 2)     Status: None   Collection Time: 11/26/2020 11:04 AM   Specimen: BLOOD LEFT HAND  Result Value Ref Range Status   Specimen Description BLOOD LEFT HAND  Final   Special Requests   Final    BOTTLES DRAWN AEROBIC AND ANAEROBIC Blood Culture results may not be optimal due to an inadequate volume of blood received in culture bottles   Culture   Final    NO GROWTH 5 DAYS Performed at [facility], [address] [place], [place] [address]    Report Status 11/14/2020 FINAL  Final  Blood culture (routine x 2)     Status: None   Collection Time: 12/02/2020 11:30 AM   Specimen: BLOOD LEFT FOREARM  Result Value Ref Range Status   Specimen Description BLOOD LEFT FOREARM  Final   Special Requests   Final    BOTTLES DRAWN AEROBIC AND ANAEROBIC Blood Culture results may not be optimal due to an inadequate volume of blood received in culture bottles   Culture   Final    NO GROWTH 5 DAYS Performed at [facility], [place] [address] [place], [place] [address]  Report Status 11/14/2020 FINAL  Final  Resp Panel by RT-PCR (Flu A&B, Covid) Nasopharyngeal Swab     Status: None   Collection Time: 12/04/2020 12:26 PM   Specimen: Nasopharyngeal Swab; Nasopharyngeal(NP) swabs in vial transport medium  Result Value Ref Range Status   SARS Coronavirus 2 by RT PCR NEGATIVE NEGATIVE Final    Comment: (NOTE) SARS-CoV-2 target nucleic acids are NOT DETECTED.  The SARS-CoV-2 RNA is generally detectable in upper  respiratory specimens during the acute phase of infection. The lowest concentration of SARS-CoV-2 viral copies this assay can detect is 138 copies/mL. A negative result does not preclude SARS-Cov-2 infection and should not be used as the sole basis for treatment or other patient management decisions. A negative result may occur with  improper specimen collection/handling, submission of specimen other than nasopharyngeal swab, presence of viral mutation(s) within the areas targeted by this assay, and inadequate number of viral copies(<138 copies/mL). A negative result must be combined with clinical observations, patient history, and epidemiological information. The expected result is Negative.  Fact Sheet for Patients:  EntrepreneurPulse.com.au  Fact Sheet for Healthcare Providers:  IncredibleEmployment.be  This test is no t yet approved or cleared by the [place] FDA and  has been authorized for detection and/or diagnosis of SARS-CoV-2 by FDA under an Emergency Use Authorization (EUA). This EUA will remain  in effect (meaning this test can be used) for the duration of the COVID-19 declaration under Section 564(b)(1) of the Act, 21 U.S.C.section 360bbb-3(b)(1), unless the authorization is terminated  or revoked sooner.       Influenza A by PCR NEGATIVE NEGATIVE Final   Influenza B by PCR NEGATIVE NEGATIVE Final    Comment: (NOTE) The Xpert Xpress SARS-CoV-2/FLU/RSV plus assay is intended as an aid in the diagnosis of influenza from Nasopharyngeal swab specimens and should not be used as a sole basis for treatment. Nasal washings and aspirates are unacceptable for Xpert Xpress SARS-CoV-2/FLU/RSV testing.  Fact Sheet for Patients: EntrepreneurPulse.com.au  Fact Sheet for Healthcare Providers: IncredibleEmployment.be  This test is not yet approved or cleared by the [place] FDA and has been  authorized for detection and/or diagnosis of SARS-CoV-2 by FDA under an Emergency Use Authorization (EUA). This EUA will remain in effect (meaning this test can be used) for the duration of the COVID-19 declaration under Section 564(b)(1) of the Act, 21 U.S.C. section 360bbb-3(b)(1), unless the authorization is terminated or revoked.  Performed at [facility], [place] [address] [place], [place] [address]   MRSA Next Gen by PCR, Nasal     Status: None   Collection Time: 11/15/2020  5:15 PM   Specimen: Nasal Mucosa; Nasal Swab  Result Value Ref Range Status   MRSA by PCR Next Gen NOT DETECTED NOT DETECTED Final    Comment: (NOTE) The GeneXpert MRSA Assay (FDA approved for NASAL specimens only), is one component of a comprehensive MRSA colonization surveillance program. It is not intended to diagnose MRSA infection nor to guide or monitor treatment for MRSA infections. Test performance is not FDA approved in patients less than 68 years old. Performed at [facility], [place] [address], [place], [place] [address]   Culture, Respiratory w Gram Stain     Status: None   Collection Time: 11/20/2020 11:30 PM   Specimen: Tracheal Aspirate; Respiratory  Result Value Ref Range Status   Specimen Description   Final    TRACHEAL ASPIRATE Performed at [facility] [address]  [place], [place] [address]    Special Requests   Final    NONE Performed at [facility], [place] [address], [place], [place] [address]    Gram Stain   Final    FEW WBC PRESENT,BOTH PMN AND MONONUCLEAR FEW GRAM NEGATIVE RODS Performed at [facility], [place] [address] [place], [place] [address]    Culture FEW PSEUDOMONAS AERUGINOSA  Final   Report Status 11/12/2020 FINAL  Final   Organism ID, Bacteria PSEUDOMONAS AERUGINOSA  Final      Susceptibility   Pseudomonas aeruginosa - MIC*    CEFTAZIDIME 8 SENSITIVE Sensitive     CIPROFLOXACIN <=0.25 SENSITIVE  Sensitive     GENTAMICIN <=1 SENSITIVE Sensitive     IMIPENEM 2 SENSITIVE Sensitive     * FEW PSEUDOMONAS AERUGINOSA  Culture, blood (routine x 2)     Status: None (Preliminary result)   Collection Time: 11/17/20 11:40 AM   Specimen: BLOOD RIGHT HAND  Result Value Ref Range Status   Specimen Description   Final    BLOOD RIGHT HAND Performed at [facility], [place] [address] [place], [place] [address]    Special Requests   Final    BOTTLES DRAWN AEROBIC ONLY Blood Culture adequate volume Performed at [place] [address] [place], [place] [address]    Culture   Final    NO GROWTH < 24 HOURS Performed at [place] [address] [place], [place] [address]    Report Status PENDING  Incomplete  Culture, blood (routine x 2)     Status: None (Preliminary result)   Collection Time: 11/17/20 11:44 AM   Specimen: BLOOD RIGHT FOREARM  Result Value Ref Range Status   Specimen Description   Final    BLOOD RIGHT FOREARM Performed at [facility], [place] [address], [place], [place] [address]    Special Requests   Final    BOTTLES DRAWN AEROBIC AND ANAEROBIC Blood Culture adequate volume Performed at [place] [address], [place], [place] [address]    Culture   Final    NO GROWTH < 24 HOURS Performed at [place] [address] [place], [place] [address]    Report Status PENDING  Incomplete    Lab Basic Metabolic Panel: Recent Labs  Lab 11/12/20 0435 11/13/20 0500 11/14/20 0345 11/17/20 1009 11/17/20 1718 11/17/20 2300 25-Nov-2020 0242 November 25, 2020 0739  NA 143 146* 142 155* 149* 150* 153* 154*  K 4.1 4.5 5.0 >7.5* >7.5* >7.5* 6.5* 6.8*  CL 97* 104 98 101 99 103 98 100  CO2 38* 38* 39* 49* 43* 49* 45* 49*  GLUCOSE 184* 161* 144* 208* 186* 211* 176* 147*  BUN 41* 47* 51* 99* 98* 100* 81* 114*  CREATININE 0.93 1.05 1.20 2.10* 2.24* 2.20* 2.28* 2.16*  CALCIUM 10.9* 11.1* 11.3* 13.5* 12.8* 12.5*  12.6* 12.8*  MG 2.4 2.5* 2.6*  --   --   --   --   --   PHOS 4.3 4.3 4.2  --   --   --   --   --    Liver Function Tests: Recent Labs  Lab 2020-11-25 0739  AST 17  ALT 21  ALKPHOS 65  BILITOT 0.5  PROT 6.3*  ALBUMIN 2.5*   No results for input(s): LIPASE, AMYLASE in the last 168 hours. No results for input(s): AMMONIA in the last 168 hours. CBC: Recent Labs  Lab  11/12/20 0435 11/13/20 0500 11/14/20 0345 11/17/20 0331 December 13, 2020 0242  WBC 19.3* 20.7* 23.3* 30.5* 22.3*  HGB 7.5* 7.5* 7.5* 8.0* 6.8*  HCT 25.7* 27.0* 26.8* 30.9* 25.7*  MCV 99.6 102.3* 103.9* 112.8* 110.3*  PLT 243 264 270 324 210   Cardiac Enzymes: No results for input(s): CKTOTAL, CKMB, CKMBINDEX, TROPONINI in the last 168 hours. Sepsis Labs: Recent Labs  Lab 11/13/20 0500 11/14/20 0345 11/17/20 0331 13-Dec-2020 0242  WBC 20.7* 23.3* 30.5* 22.3*    Procedures/Operations     Addalyne Vandehei A Junious Ragone 2020/12/13, 3:41 PM

## 2020-12-10 NOTE — Progress Notes (Signed)
Patient was suctioned and cleaned then Ventilator was turned off and removed. Family at bedside with patient.

## 2020-12-10 NOTE — Progress Notes (Signed)
Patient extubated to room air on IV morphine.  Family at bedside.  Patient appears comfortable with no distress noted at this time.

## 2020-12-10 NOTE — Progress Notes (Signed)
eLink Physician-Brief Progress Note Patient Name: Steve [name] DOB: 1955/08/27 MRN: [id number]   Date of Service  Dec 14, 2020  HPI/Events of Note  Hyperkalemia - K+ > 7.5.  eICU Interventions  Plan: D50 1 amp IV now. Novolog 10 units IV now. NaHCO3 100 meq IV now. Calcium gluconate 1 gm IV now. Lokelma 10 gm per tube now and TID. Repeat BMP at 5 AM.      Intervention Category Major Interventions: Electrolyte abnormality - evaluation and management  Layonna Dobie Eugene 12/14/2020, 12:05 AM

## 2020-12-10 NOTE — Progress Notes (Signed)
eLink Physician-Brief Progress Note Patient Name: RYIN AMBROSIUS DOB: 26-Jan-1956 MRN: [id number]   Date of Service  12/13/20  HPI/Events of Note  Multiple issues: 1. Hyperkalemia - K+ = 6.5. K+ is down from > 7.5 and only received Lokelma at 1 AM. Lokelma is ordered Q 8 hours. 2. Anemia - Hgb = 6.8. 3. Na+ = 153.  eICU Interventions  Plan: Continue Lokelma as ordered. Transfuse 1 unit PRBC now. Increase free water to 200 mL per tube Q 2 hours.     Intervention Category Major Interventions: Other:;Electrolyte abnormality - evaluation and management  Aminta Sakurai [place] 12/13/20, 4:18 AM

## 2020-12-10 NOTE — Progress Notes (Addendum)
NAME:  Steve [name], MRN:  [id number], DOB:  February 27, 1956, LOS: 50 ADMISSION DATE:  12/08/2020, CONSULTATION DATE:  8/1 REFERRING MD:  Dr. Colin Mulders, CHIEF COMPLAINT:  Increased vent use   Brief History:   65 y/o M who presented to [facility] ER on 8/1 with reports of increased vent use over the preceding 4 days and desaturations.    The patient is followed by Dr. Marin Olp for Hodgin's Lymphoma and was deemed not a candidate for further treatment due to his functional status. He was last reviewed on 11/03/20 with plan for follow up in 4 weeks to reassess functional status. He has had a prolonged chronic critical illness since March of 2021 - including lymphoma, COVID, ARDS thought multifactorial in the setting of COVID and possible pneumonitis from immunotherapy, chronic trach/vent, multiple chest tubes, malnutrition, bacteremia, frequent readmissions and failure to thrive.   He was  admitted from 6/11 - 7/18 for poor appetite and malaise.  Work up at that time found him to be positive for staph epidermidis bacteremia.  He was seen by ID with a discharge plan of 6 weeks of IV antibiotics (first negative blood culture 09/22/20).    His wife reported the patient has been requiring the home vent consistently over the last four days PTA.  He previously had been on the ATC during the day times.  He has home health that come in to assist in his care 2x per week.  His wife and the home care RN noted he used the urinal on 8/1 and desaturated into 80's. They re-checked the sats via home health RN's meter and they were in the 50's.  EMS was activated. On ER arrival, he was noted to be somnolent on the vent and lethargic.  ABG evaluation pending. Most recent ABG in July 7.318 / 88.   CXR review showed known severe diffuse bilateral airspace disease, moderate right pleural effusion. Wife reported he had not had his midodrine until he got to the ER.  Initial labs notable for CO2 of 42, glucose 172, BUN 28 / Sr Cr 0.85 (up from  0.5), albumin 2.1, lactic acid 4.1, WBC 26.6, Hgb 9.4, and platelets 306.    PCCM consulted for evaluation of hypoxic respiratory failure.   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 Down to 60% PEEP 10  8/7 worse gas exchange, FI02 up to 100%. Much more comfortable on MS scheduled but arouses to verbal   Interim History / Subjective:  Refractory hyperkalemia and hypercarbia.  Last K from 0245AM dow from > 7.5 to 6.5. Repeat labs pending.  Objective   Blood pressure (!) 110/55, pulse 91, temperature 97.8 F (36.6 C), temperature source Axillary, resp. rate (!) 24, height 5\' 5"  (1.651 m), weight 69.9 kg, SpO2 92 %.    Vent Mode: PRVC FiO2 (%):  [80 %-100 %] 100 % Set Rate:  [14 bmp-24 bmp] 24 bmp Vt Set:  [490 mL] 490 mL PEEP:  [10 cmH20] 10 cmH20 Plateau Pressure:  [34 cmH20-40 cmH20] 40 cmH20   Intake/Output Summary (Last  24 hours) at 2020-12-16 0745 Last data filed at 12-16-20 0333 Gross per 24 hour  Intake 1382.37 ml  Output 400 ml  Net 982.37 ml    Filed Weights   11/16/20 0500 11/17/20 0354 12/16/20 0500  Weight: 69.8 kg 69.8 kg 69.9 kg    Examination: General: Adult male, chronically ill appearing, resting in bed, in NAD. Neuro: Opens eyes to noxious stimuli but does not follow commands. HEENT: [place]/AT. Sclerae anicteric. Trach C/D/I. Cardiovascular: RRR, 3/6 SEM.  Lungs: Respirations even and unlabored.  CTA bilaterally, No W/R/R.  Abdomen: BS x 4, soft, NT/ND.  Musculoskeletal: No gross deformities, no edema.  Skin: Intact, warm, no rashes. GU:  400cc UOP yesterday, net + 11.7L since admit.   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  -meropenem as below, stop date in place  -trach care per protocol  -Continue solumedrol 40 mg QD  -PRVC 8cc/kg, unable to wean  -Repeat ABG now -follow intermittent CXR  -PRN morphine for air hunger, pain   Severe Sepsis due to Pseudomonas Aeruginosa HCAP Trach asp 8/1 with few Psuedomonas Aeruginosa. There was no Pseudomonas on 5/22 resp cx.  Finished abx course 7/26 for staph epi bacteremia -meropenem D7/7 for pan sensitive pseudomonas   Chronic Hypotension  -midodrine 10 mg TID   New Murmur - new echo 8/9 with mod TR, no vegetations noted -Supportive care -Consider TEE depending on goals of care discussions with wife  AKI  Hyperkalemia -Repeat labs now -Continue free water PT  -Trend BMP / urinary output -Replace electrolytes as indicated -Avoid nephrotoxic agents, ensure adequate renal perfusion -Unfortunately he would not be an HD candidate  Volume overload - net +11.7L since admit. - 40mg  Lasix now.  Anemia of Chronic Disease - currently getting 1u PRBC for Hgb 6.8 -trend CBC  DM II with Steroid Induced Hyperglycemia  -SSI, moderate scale   Hodgkin's lymphoma, relapsed  -no further options per Dr. Marin Olp given functional score   FTT Severe protein calorie malnutrition -TF per Nutrition    Depression, Anxiety  -ativan, zoloft  Dermatitis -Pepcid BID, benadryl cream  -PRN benadryl IV   DNR Status Encounter for Palliative Care  -DNR on admission  -ongoing Encampment discussion, treat the treatable for now  -PRN morphine for pain, SOB  Chronic  Conditions Present on Admit -chronic hypoxic hypercarbic respiratory failure -tracheostomy status, VDRF  -post-covid pulmonary fibrosis (UIP) -Hodgkin's lymphoma with relapse, without further tx options -anemia of chronic dz -severe protein calorie malnutrition,  - severe cachexia - Failure to Thrive; FTT  -mood disorder: depression, anxiety  -chronic hypotension on midodrine -soft tissue pressure injury -DM2 -DNR status  -pruritis   Best Practice (right click and "Reselect all SmartList Selections" daily)  Diet/type: tubefeeds DVT prophylaxis: prophylactic heparin  GI prophylaxis: PPI Lines: N/A Foley:  N/A Code Status:  DNR Last date of multidisciplinary goals of care discussion: DNR (POA).    Wife planning to visit this AM per notes.  Will provide her with an update at that time.  CC time: 40 min.   Montey Hora, [facility] For pager details, please see AMION or use Epic chat  After 1900, please call  [facility] for cross coverage needs December 17, 2020, 8:08 AM

## 2020-12-10 NOTE — Progress Notes (Signed)
Palliative:  HPI: 65 y.o. 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 Trach exchanged for shiley 6 XLT with some improvement in oxygenation. Acute decline over past few days.   I met today at [facility] bedside as he is actively declining towards end of life. Requiring 100% FiO2 and breathing appears agonal on vent. HR increasing up to 180s and very unstable. I reached out to wife, [name], who tells me that she is expecting the worst and will be at bedside shortly.   I met at bedside with [name] and joined by Dr. Ander Slade. [name] is aware that Breylon is dying. She expresses that they knew this day was coming and she feels that she is as prepared as she could be. She has their children already in therapy and has reached out to their therapist to make them aware of situation and has told the children that he will not be coming back home this time. [name] reflects at how strong and how much Taevin has been through with amazement of his strength and resilience. She is able to express that he deserves comfort and dignity at this time. She is very fearful of his feeling short of breath and suffering if ventilator removed to prevent further suffering. I reassured her that we will provide Meldon with liberal medication to ensure that he is sleeping and resting through this process and will not even be aware of how his body is physically responding and the medication will provide him relief from pain and shortness of breath feelings. [name] expresses understanding and continues to reiterate her desire that he do not suffer. We did  discuss spiritual care support and [name] does not desire this for herself but feels Doyel and his family may desire visit and that he does have a [place] background.   All questions/concerns addressed. Emotional support provided.   Exam: Sedated on vent. Agonal on vent. HR up to 180s. Actively dying.   Plan: - Full comfort care.  - Orders changed to reflect full comfort care. - Liberal medication for pain, shortness of breath, anxiety, secretions to ensure comfort.   48 min  Vinie Sill, NP Palliative Medicine Team Pager [phone] (Please see [email or web address] for schedule) Team Phone [phone]    Greater than 50%  of this time was spent counseling and coordinating care related to the above assessment and plan

## 2020-12-10 NOTE — Progress Notes (Signed)
Patient time of death occurred at 57.

## 2020-12-10 NOTE — Progress Notes (Signed)
Nutrition Brief Note  Chart reviewed. Last full assessment by this RD done on 8/8. Able to talk with Agricultural consultant.  Patient now transitioning to comfort care. Tube feeding has already been discontinued.  No further nutrition interventions planned at this time. Please re-consult as needed.       Jarome Matin, MS, RD, LDN, CNSC Inpatient Clinical Dietitian RD pager # available in [place]  After hours/weekend pager # available in [facility]

## 2020-12-10 NOTE — Progress Notes (Signed)
Patient expired with family at bedside at 1445.   Notified spouse via phone.  Unknown funeral home arrangements at this time.  Spouse given emotional support and informed to call back hospital when funeral home is known.  Palliative and intensivist team notified of time of death.

## 2020-12-10 NOTE — Progress Notes (Signed)
Chaplain engaged in a visit with Junah's family.  Chaplain was able to meet Linwood's sister, two children and their spouses.  Chaplain learned about Rambo through them.  Laster was described as being loving, forgiving, a Scientist, research (physical sciences), and adventurous.  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  He also had motorcycles and Jet Skis for his adventurous side.  His family talked about the ways Alfonza loves tomato sandwiches and grapefruit.  His children noted how great of a father he has been to them and how he taught them how not to become stressed out by people and things around them.  They voiced that he is the type of man to just let things roll off of his back instead of getting worked up about someone or something.  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  He used to bring back souvenirs to his children.   Chaplain offered empathic listening, presence, support, and prayer with them.    11-19-20 1400  Clinical Encounter Type  Visited With Patient and family together  Visit Type Spiritual support;Patient actively dying  Spiritual Encounters  Spiritual Needs Prayer

## 2020-12-10 DEATH — deceased

## 2021-08-06 IMAGING — US US SOFT TISSUE HEAD/NECK
1 series · 14 of 25 positions shown · non-contrast
Comparison: None.

CLINICAL DATA: Right neck mass

EXAM:
ULTRASOUND OF HEAD/NECK SOFT TISSUES
TECHNIQUE: Ultrasound examination of the head and neck soft tissues was
performed in the area of clinical concern.

[Series 1: us soft tissue head/neck · 25 acquisitions, 14 frames shown]
[im 1/25]
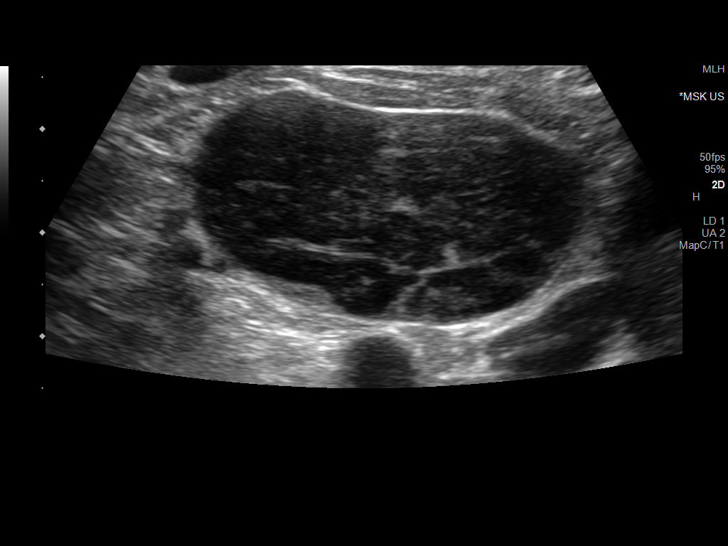
[im 3/25]
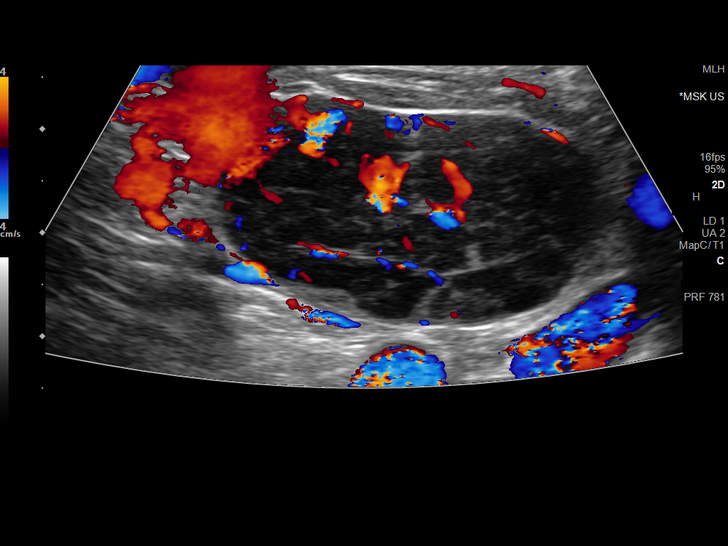
[im 5/25]
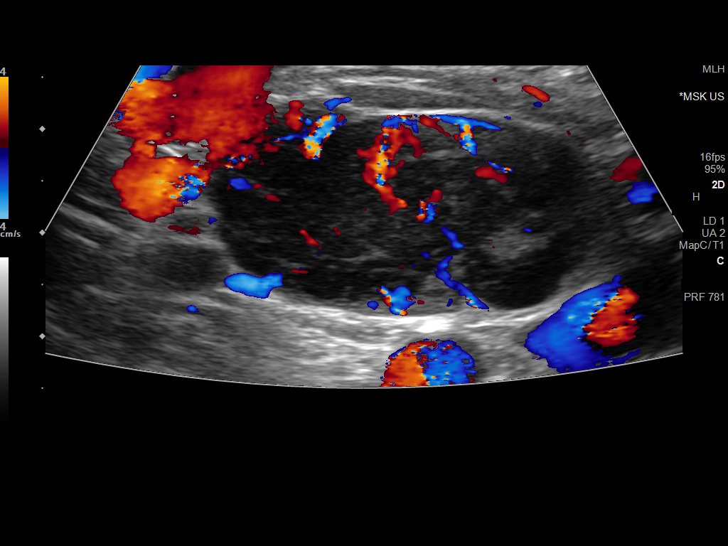
[im 7/25]
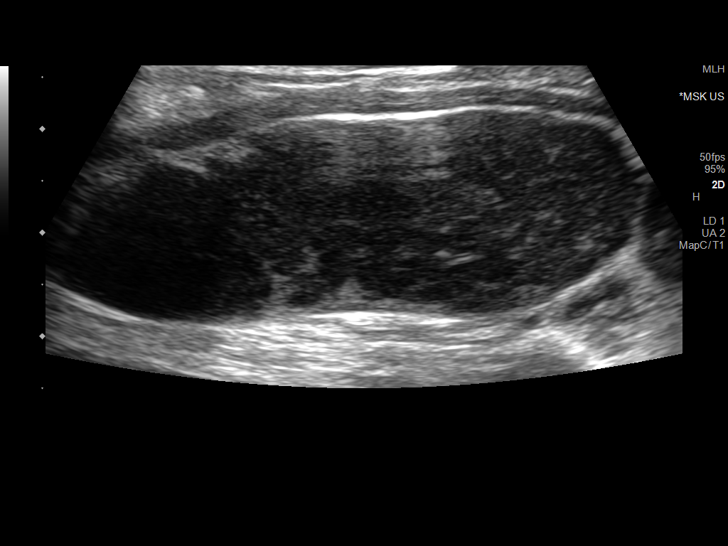
[im 9/25]
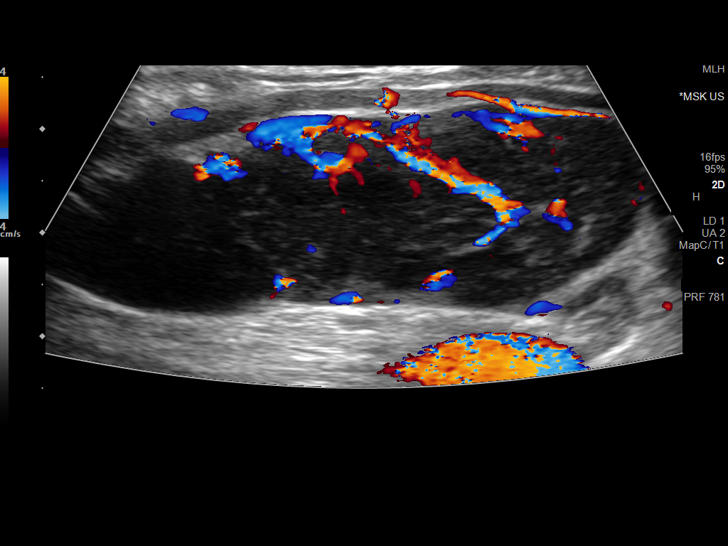
[im 10/25]
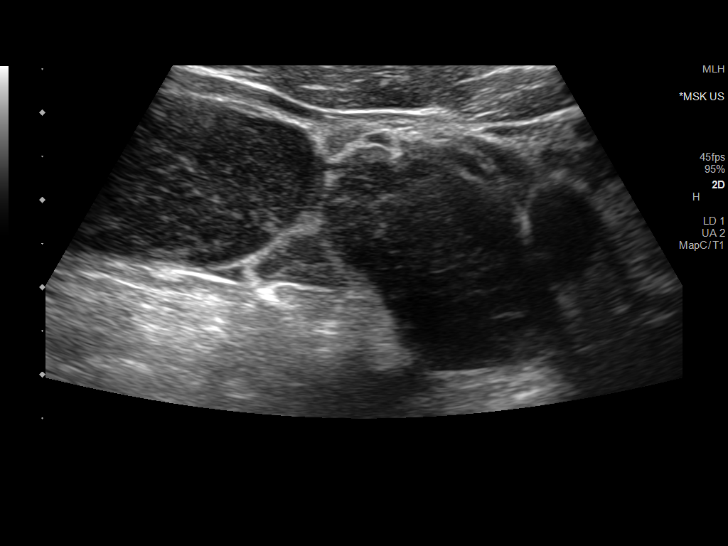
[im 12/25]
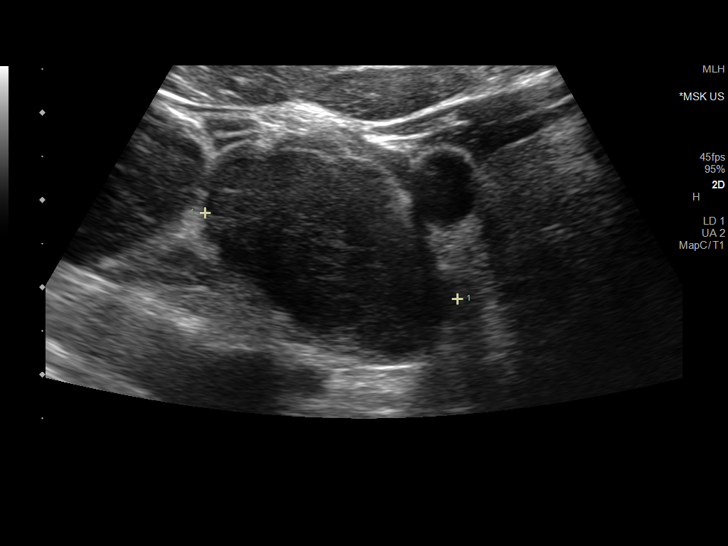
[im 14/25]
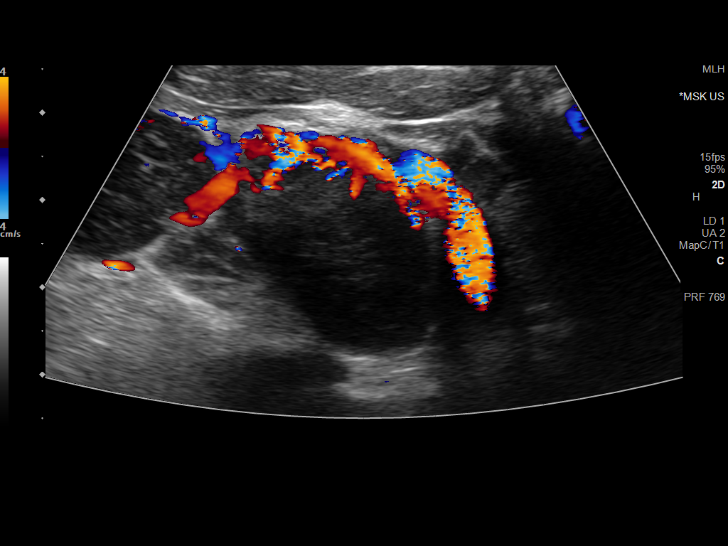
[im 16/25]
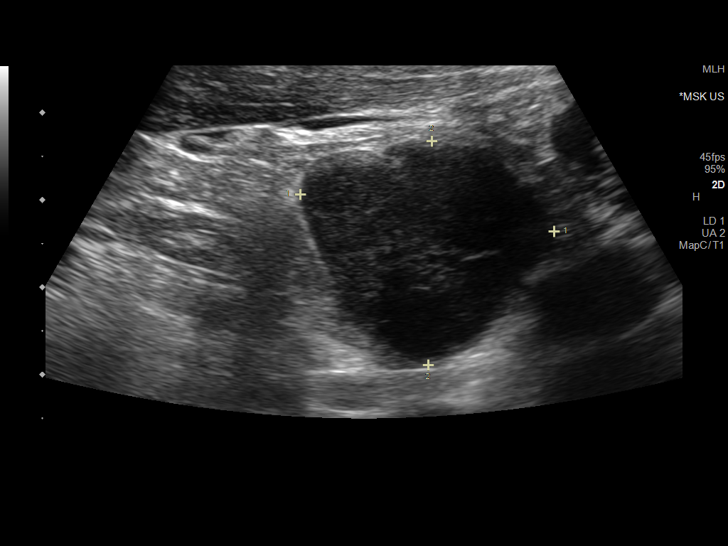
[im 17/25]
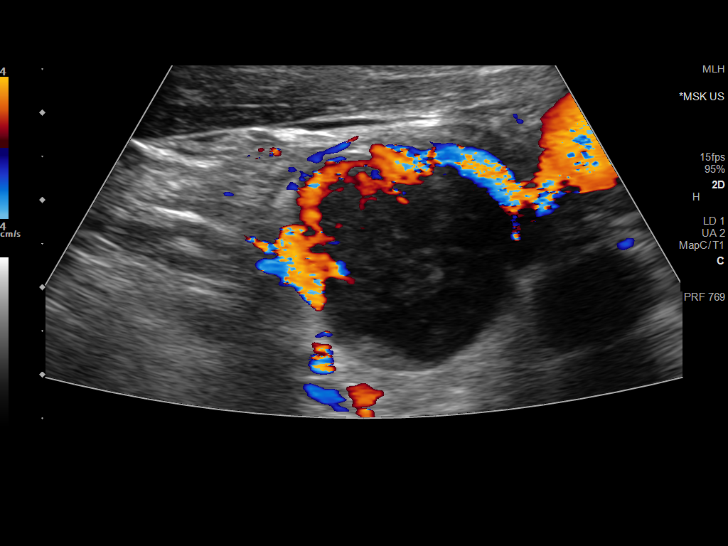
[im 19/25]
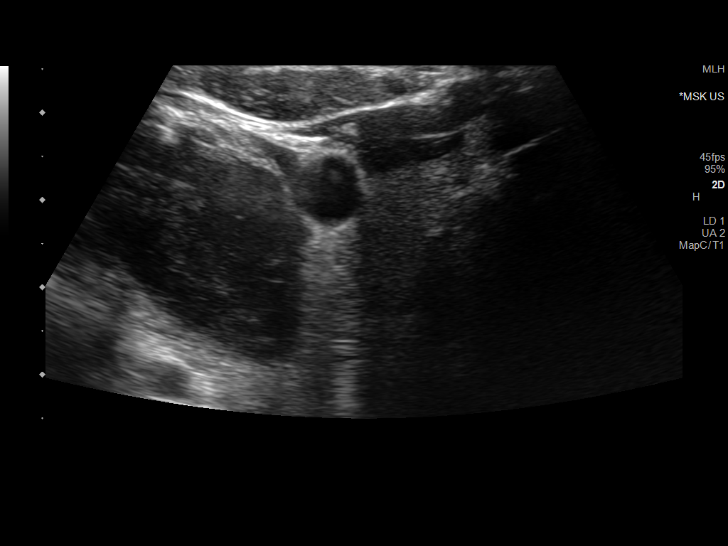
[im 21/25]
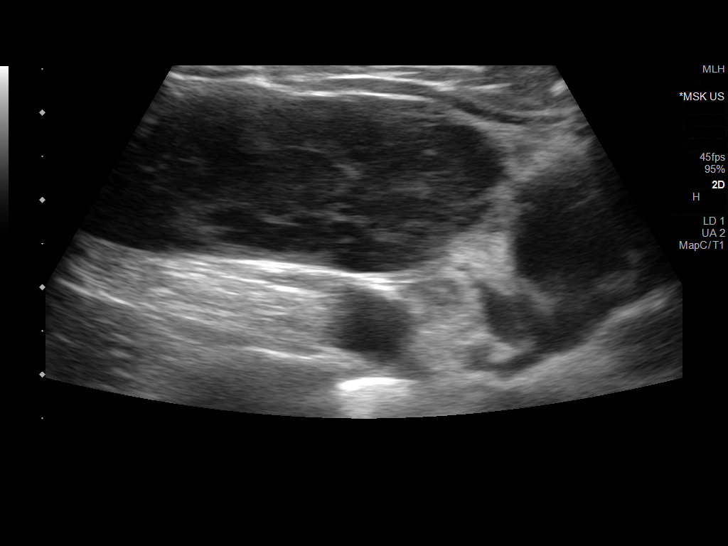
[im 23/25]
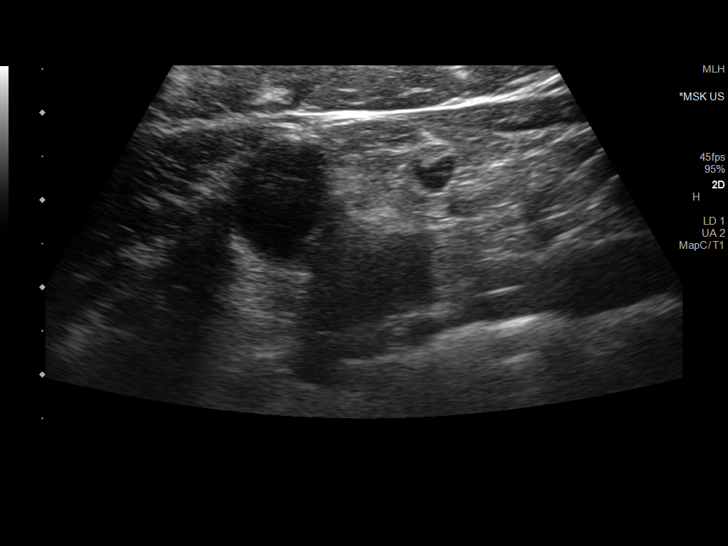
[im 25/25]
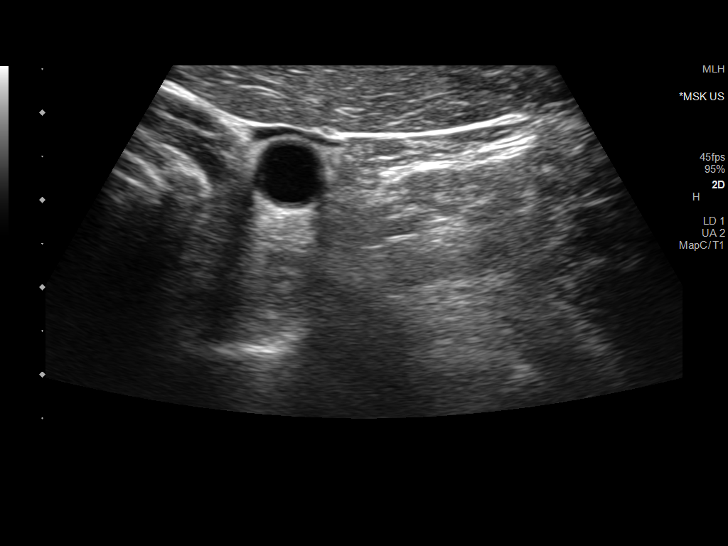

[14 of 25 positions shown; findings below may reference images not displayed]

FINDINGS: Two enlarged lymph nodes in the right supraclavicular region. The
lateral lymph node measures 3.9 x 2.0 x 5.7 cm. The more medial
lymph node measures 2.9 x 2.6 x 3.1 cm. The lymph nodes show
homogeneous hypoechogenic echotexture. The lymph nodes appear
hypervascular on Doppler.
IMPRESSION: 2 enlarged right supraclavicular lymph nodes concerning for
malignancy or lymphoma. Consider further evaluation with CT neck and
chest with contrast. Biopsy recommended

## 2021-08-15 IMAGING — CT CT CHEST W/ CM
2 of 5 series · 13 of 36 positions shown, 16 images · IV contrast (omnipaque)
Comparison: CT abdomen pelvis 01/04/2013

CLINICAL DATA: Patient with new diagnosis of right neck skin
cancer. Enlarged cervical adenopathy and supraclavicular adenopathy.

EXAM:
CT CHEST, ABDOMEN, AND PELVIS WITH CONTRAST
TECHNIQUE: Multidetector CT imaging of the chest, abdomen and pelvis was
performed following the standard protocol during bolus
administration of intravenous contrast.
CONTRAST:  100mL OMNIPAQUE IOHEXOL 300 MG/ML  SOLN

[Series 3: cap with 2 · axial · 0.84mm/px · z∈[-982,-417]mm · 10 of 139 slices shown, 13 images]
[im 13/139  mediastinal]
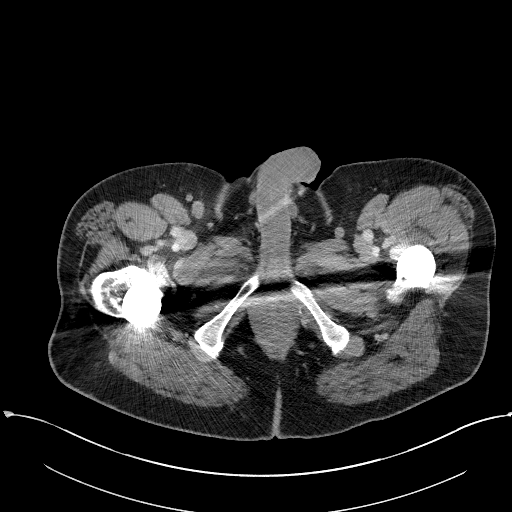
[im 13/139  lung]
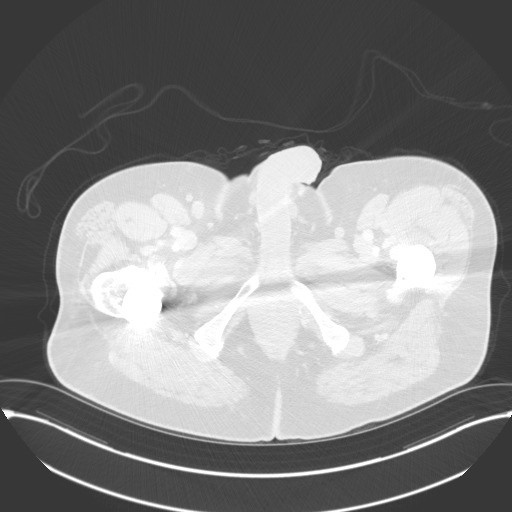
[im 26/139  lung]
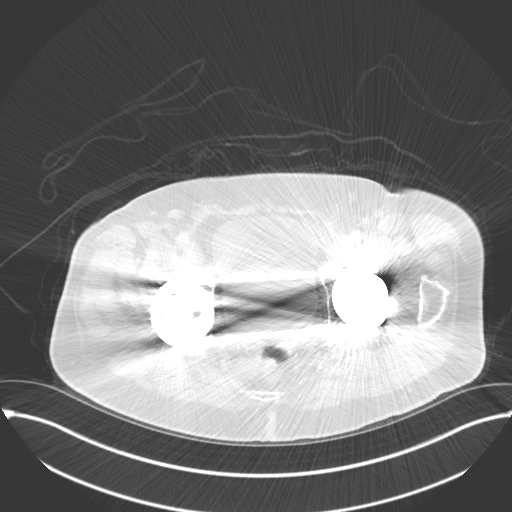
[im 38/139  lung]
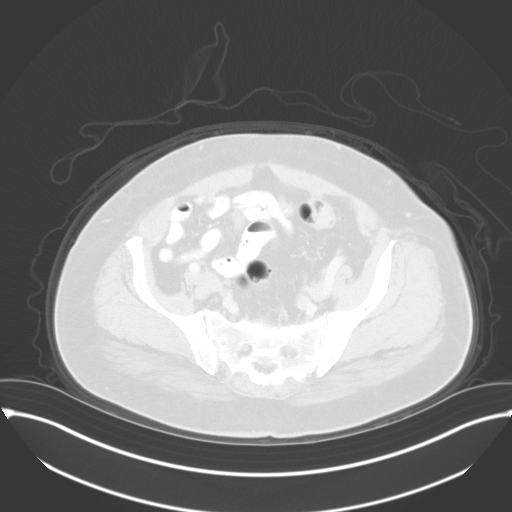
[im 51/139  lung]
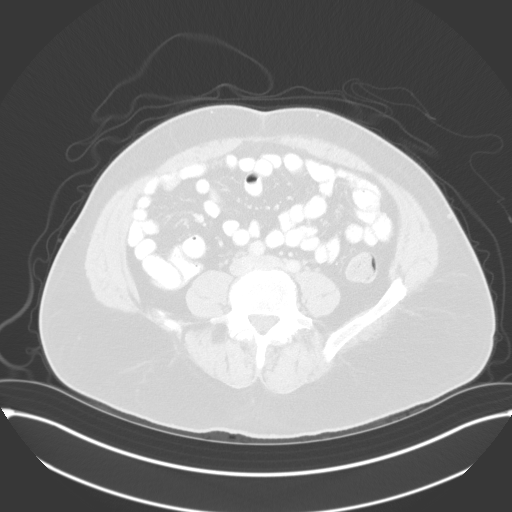
[im 63/139  mediastinal]
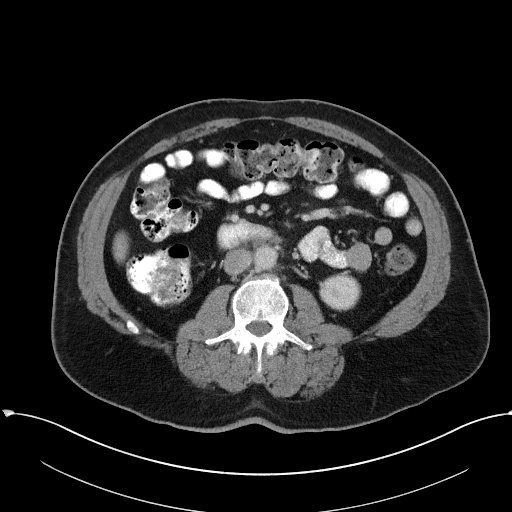
[im 63/139  lung]
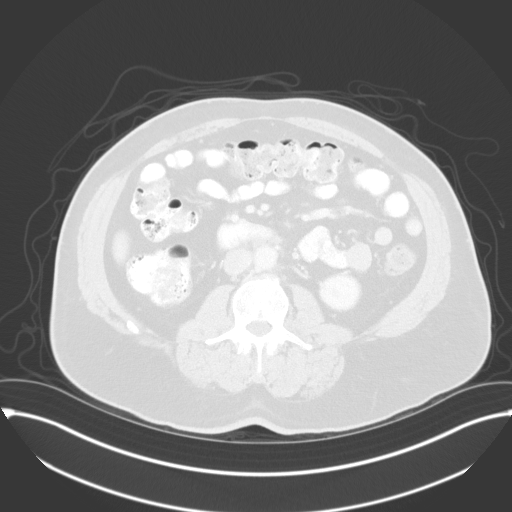
[im 76/139  lung]
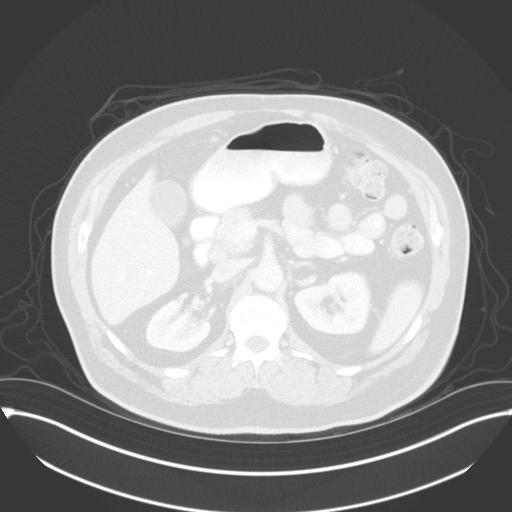
[im 88/139  lung]
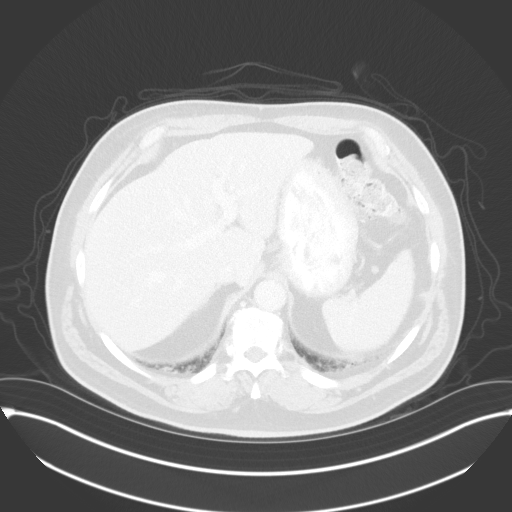
[im 101/139  lung]
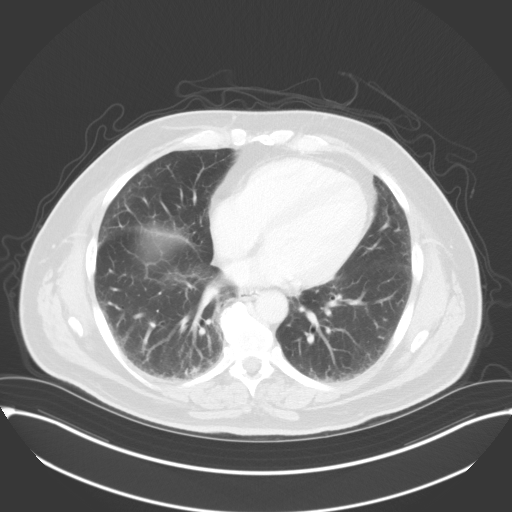
[im 113/139  mediastinal]
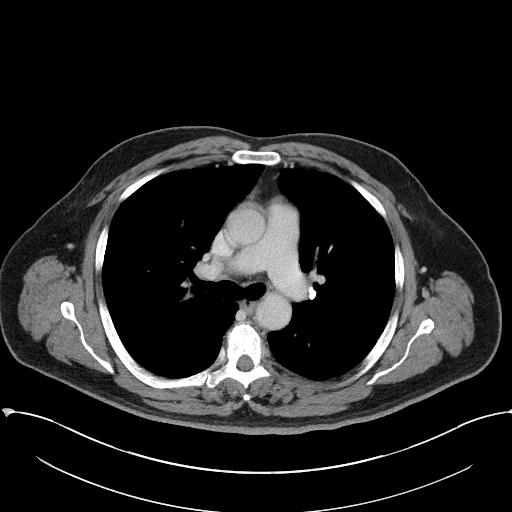
[im 113/139  lung]
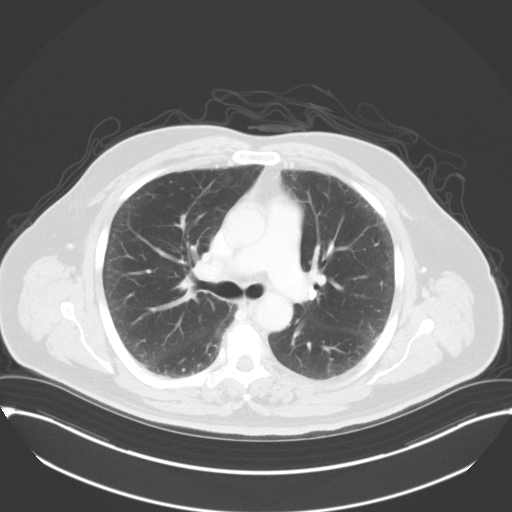
[im 126/139  lung]
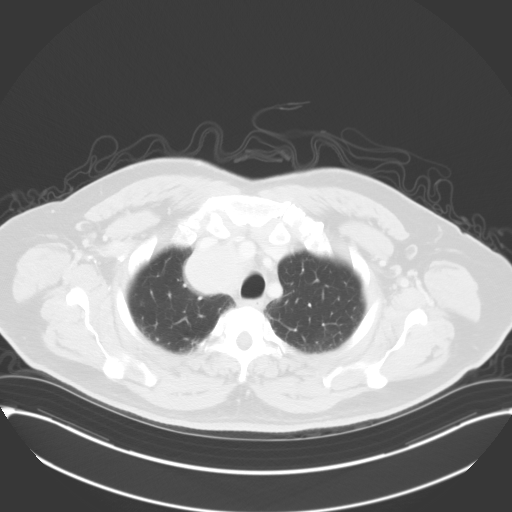

[Series 6: coronals · coronal · 0.78mm/px · 3 of 148 slices shown]
[im 30/148  lung]
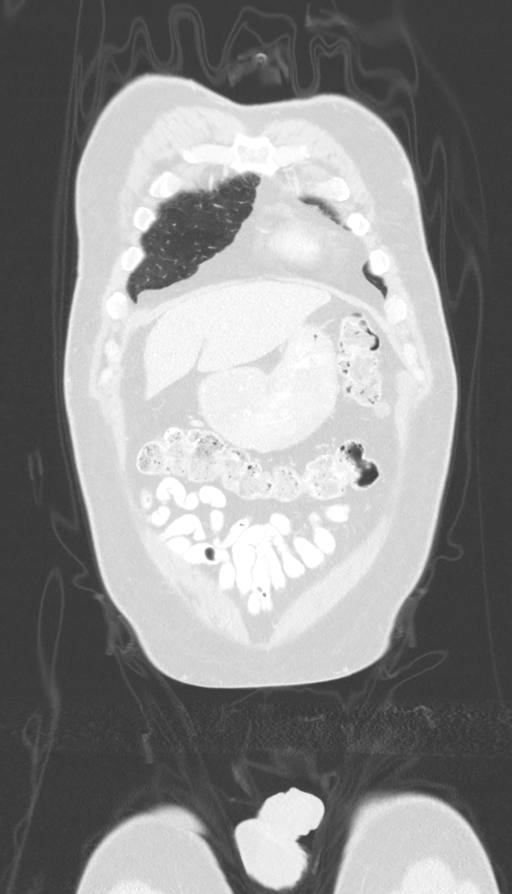
[im 59/148  lung]
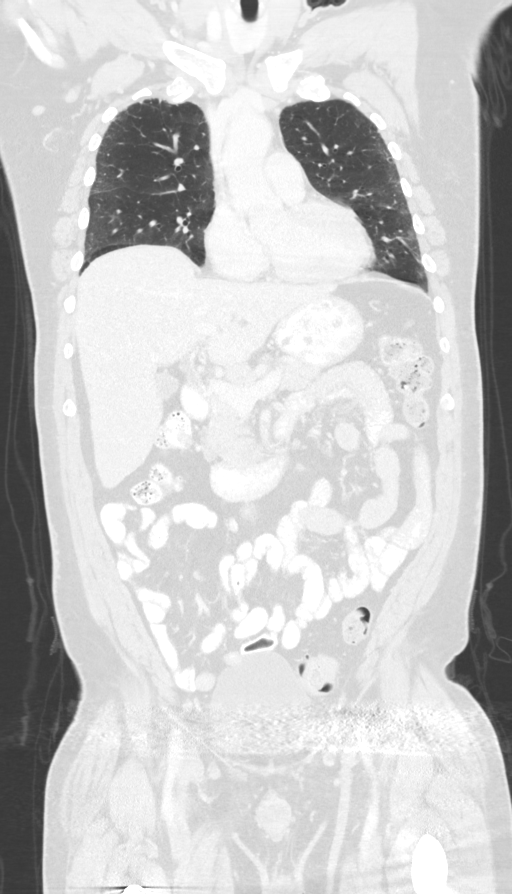
[im 89/148  lung]
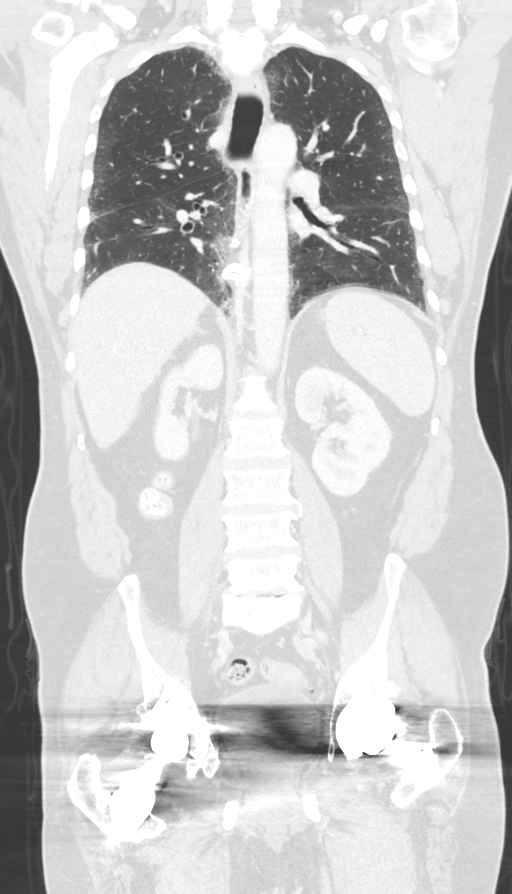

[13 of 36 positions shown; findings below may reference images not displayed]

FINDINGS: CT CHEST FINDINGS

Cardiovascular: Normal heart size. No pericardial effusion. Aorta
and main pulmonary artery normal in caliber. Thoracic aortic
vascular calcifications.

Mediastinum/Nodes: Enlarged right supraclavicular lymph nodes
measuring up to 2.8 cm in thickness (image 5; series 3). There is a
large right paratracheal lymph node measuring 4.8 x 5.9 cm (image
17; series 3). This exerts mass effect on the adjacent trachea.
There is a 1.5 cm prevascular node (image 17; series 3). Small
hiatal hernia. Normal appearance of the esophagus.

Lungs/Pleura: Central airways are patent. Bilateral subpleural
reticular opacities. Associated bronchiectasis. No discrete
pulmonary nodules are identified. No pleural effusion or
pneumothorax.

Musculoskeletal: Thoracic spine degenerative changes. No aggressive
or acute appearing osseous lesions.

CT ABDOMEN PELVIS FINDINGS

Hepatobiliary: Stable subcentimeter too small to characterize
low-attenuation lesion caudate lobe (image 55; series 3).
Gallbladder is unremarkable. No intrahepatic or extrahepatic biliary
ductal dilatation.

Pancreas: Unremarkable

Spleen: Unremarkable

Adrenals/Urinary Tract: Normal adrenal glands. Kidneys enhance
symmetrically with contrast. No hydronephrosis. Urinary bladder
poorly visualized due to streak artifact.

Stomach/Bowel: Sigmoid colonic diverticulosis. No CT evidence for
acute diverticulitis. No evidence for small bowel obstruction. No
free fluid or free intraperitoneal air. Normal morphology of the
stomach.

Vascular/Lymphatic: Normal caliber abdominal aorta. 1.4 cm precaval
node (image 64; series [DATE] cm retrocaval node (image 73; series
[DATE] cm aortocaval node (image 80; series [DATE] cm left
periaortic node (image 81; series 3).

Reproductive: Poorly visualized.

Other: Small fat containing left inguinal hernia.

Musculoskeletal: Bilateral hip arthroplasties. Lower thoracic and
lumbar spine degenerative changes. No aggressive or acute appearing
osseous lesions.
IMPRESSION: 1. Bulky right supraclavicular and mediastinal adenopathy most
compatible with metastatic disease. Additionally there is porta
hepatic and retroperitoneal adenopathy within the abdomen concerning
for metastatic disease.
2. Mild fibrotic changes involving the lungs bilaterally.
3. See dedicated neck CT report.

## 2021-08-15 IMAGING — CT CT NECK W/ CM
4 series · 14 of 33 positions shown, 17 images · IV contrast (Omnipaque)
Comparison: CT chest 02/28/2019

CLINICAL DATA: Enlarged right supraclavicular lymph nodes

EXAM:
CT NECK WITH CONTRAST
TECHNIQUE: Multidetector CT imaging of the neck was performed using the
standard protocol following the bolus administration of intravenous
contrast.
CONTRAST:  100mL OMNIPAQUE IOHEXOL 300 MG/ML  SOLN

[Series 3: axial neck · axial · 0.51mm/px · z∈[-430,-242]mm · 5 of 142 slices shown, 7 images]
[im 24/142  soft-tissue]
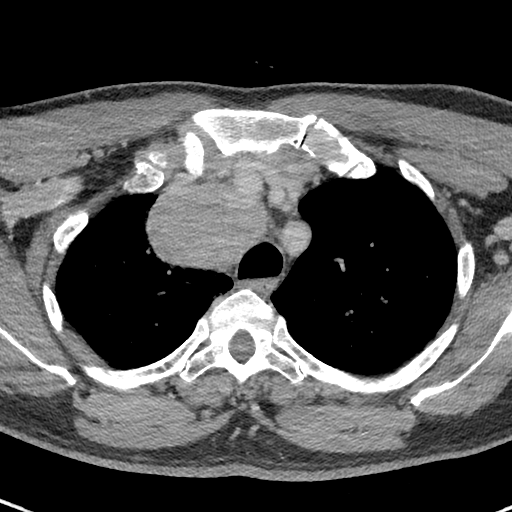
[im 24/142  bone]
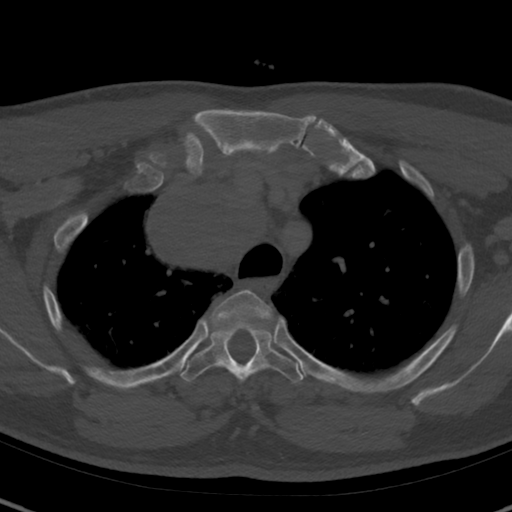
[im 48/142  bone]
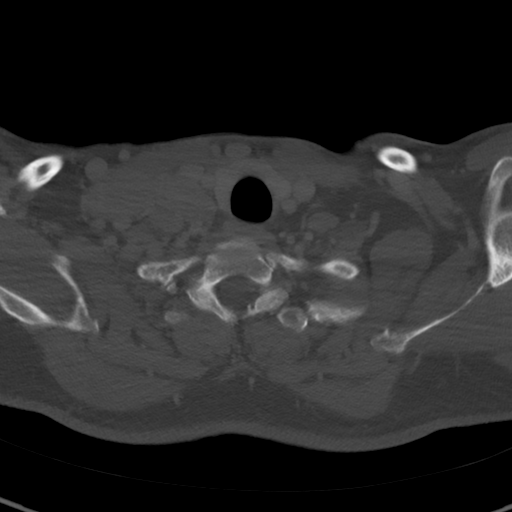
[im 71/142  bone]
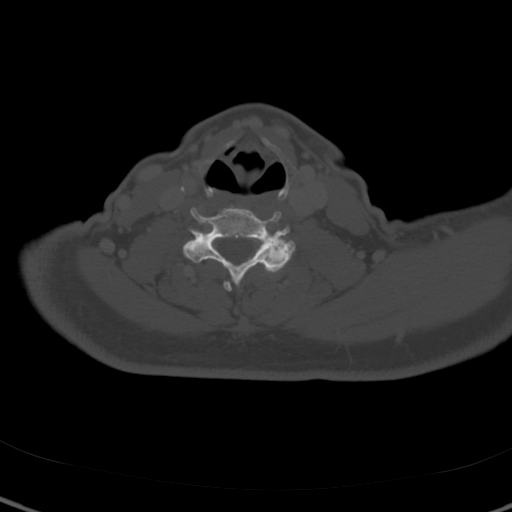
[im 95/142  bone]
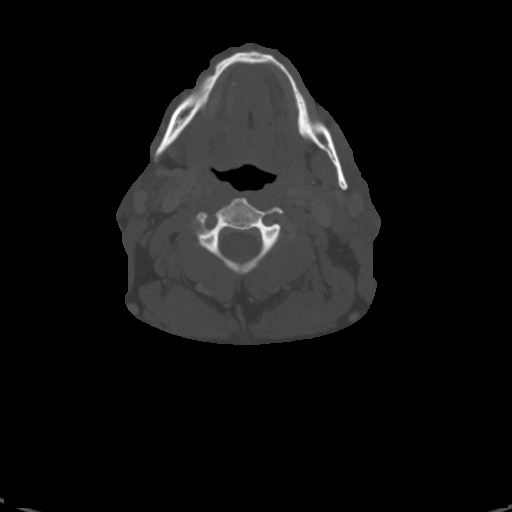
[im 118/142  soft-tissue]
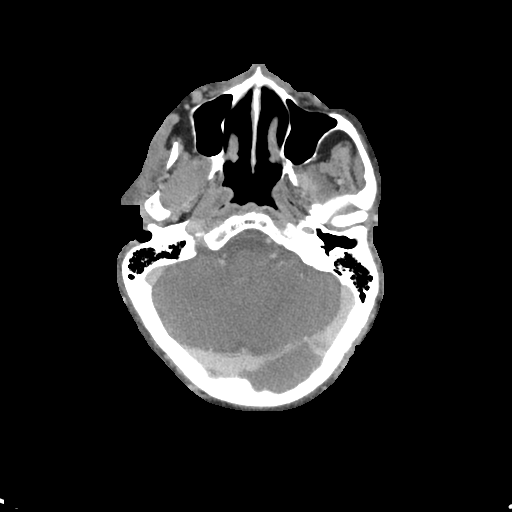
[im 118/142  bone]
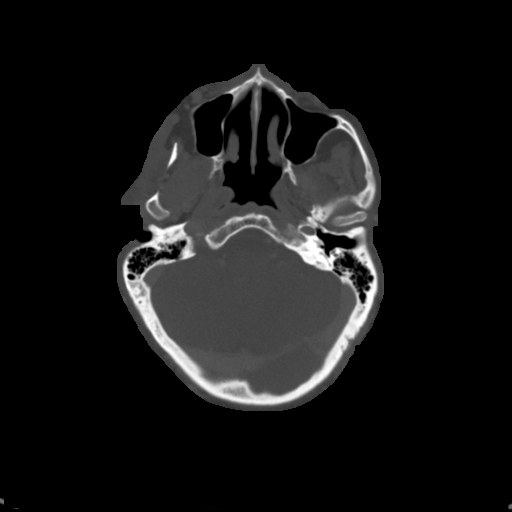

[Series 6: sag neck · sagittal · 0.55mm/px · 5 of 204 slices shown, 6 images]
[im 68/204  bone]
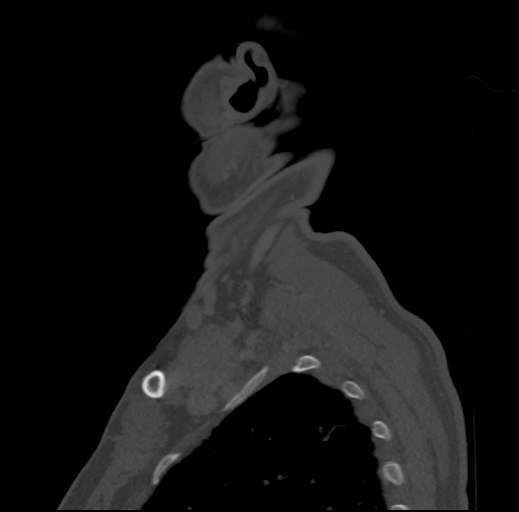
[im 85/204  bone]
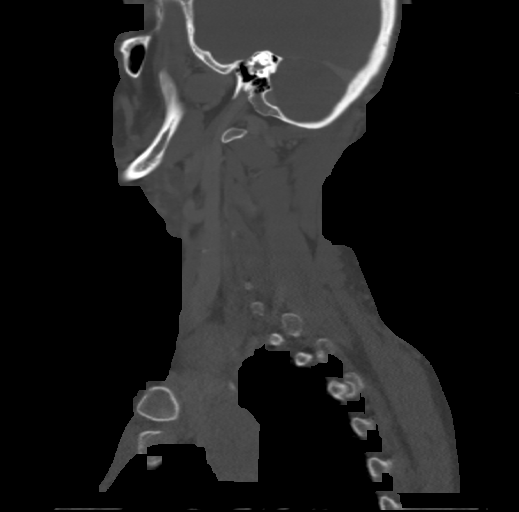
[im 102/204  soft-tissue]
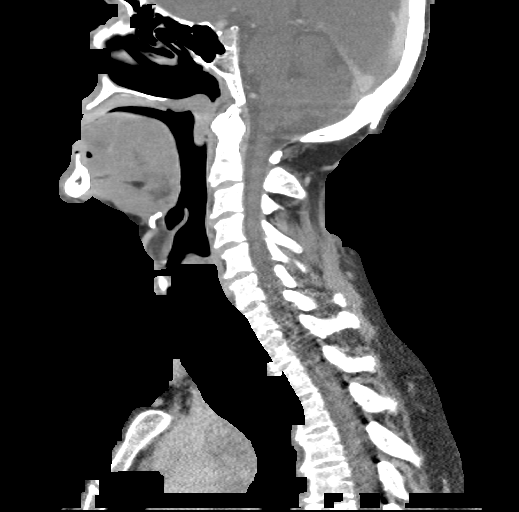
[im 102/204  bone]
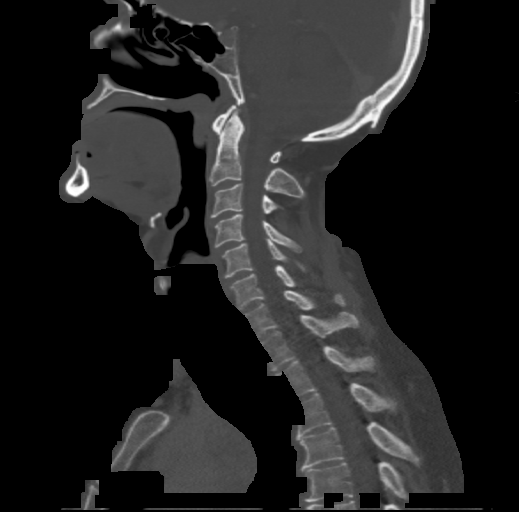
[im 119/204  bone]
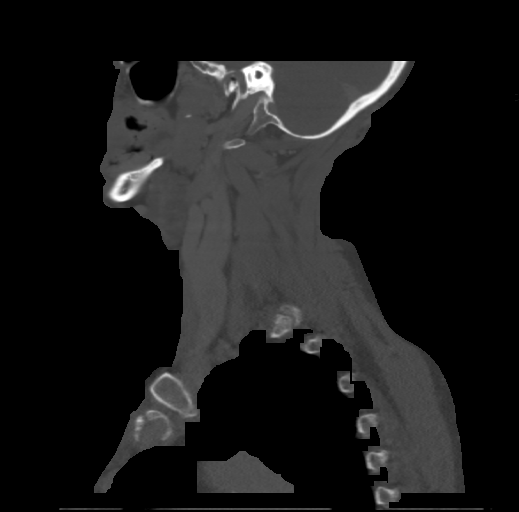
[im 136/204  bone]
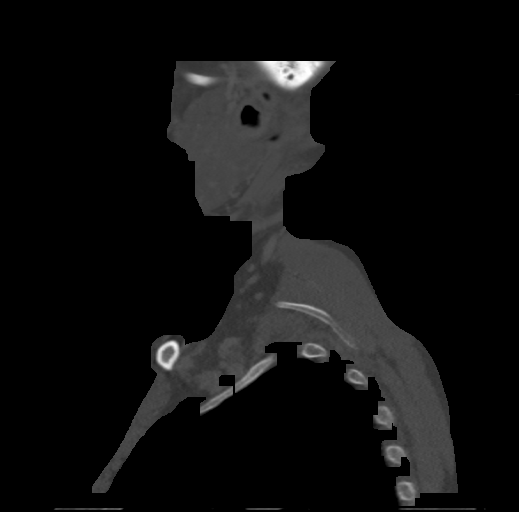

[Series 7: cor neck · coronal · 0.55mm/px · 3 of 108 slices shown]
[im 22/108  bone]
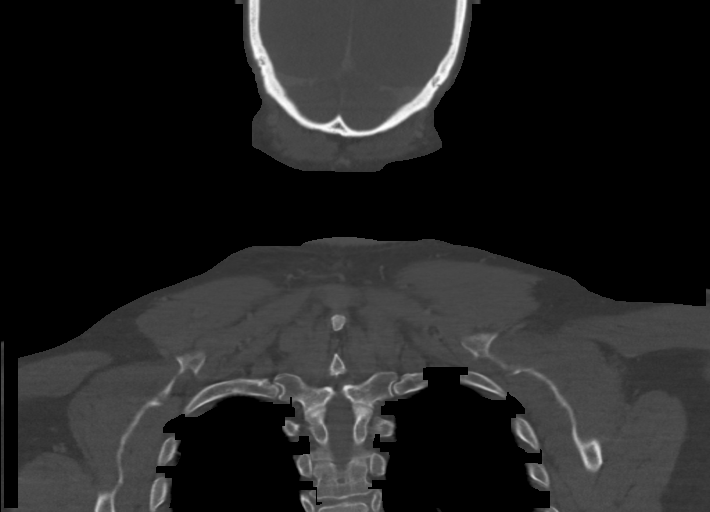
[im 43/108  bone]
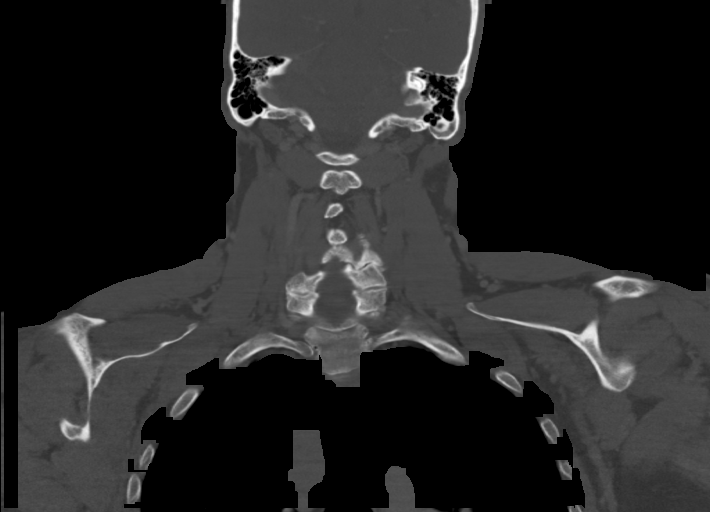
[im 65/108  bone]
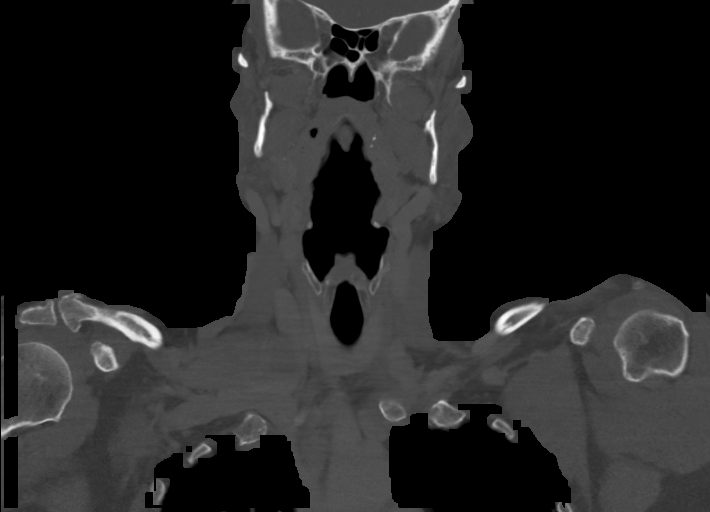

[Series 8: orthogonal ax · axial · 0.53mm/px · 1 of 141 slices shown]
[im 24/141  bone]
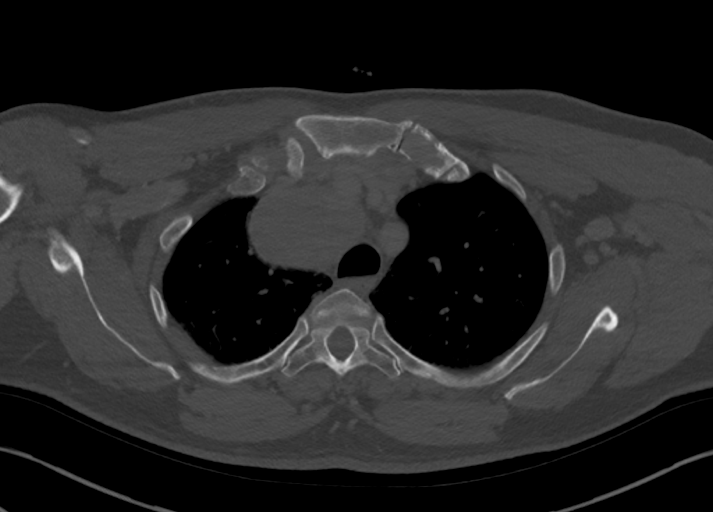

[14 of 33 positions shown; findings below may reference images not displayed]

FINDINGS: Pharynx and larynx: Normal. No mass or swelling.

Salivary glands: No inflammation, mass, or stone.

Thyroid: Normal thyroid size.  Negative for nodule

Lymph nodes: Right supra supraclavicular lymph node mass. Adjacent
lymph nodes measure 26 x 52 mm, and 30 x 22 mm. Left supraclavicular
region is normal. No other lymphadenopathy in the neck.

Vascular: Normal vascular enhancement.

Limited intracranial: Negative

Visualized orbits: Negative

Mastoids and visualized paranasal sinuses: Negative

Skeleton: Cervical spondylosis.  No acute skeletal abnormality.

Upper chest: Chest CT findings reported separately. Large right
paratracheal lymph node 6 cm in diameter.

Other: None
IMPRESSION: Right paratracheal enlarged lymph node and right supraclavicular
enlarged lymph nodes compatible with neoplasm. No pharyngeal mass in
the neck. No other adenopathy in the neck. Tissue sampling is
recommended.

## 2021-08-15 IMAGING — CT CT ABD-PELV W/ CM
2 of 5 series · 13 of 36 positions shown, 16 images · IV contrast (Omnipaque)
Comparison: CT abdomen pelvis 01/04/2013

CLINICAL DATA: Patient with new diagnosis of right neck skin
cancer. Enlarged cervical adenopathy and supraclavicular adenopathy.

EXAM:
CT CHEST, ABDOMEN, AND PELVIS WITH CONTRAST
TECHNIQUE: Multidetector CT imaging of the chest, abdomen and pelvis was
performed following the standard protocol during bolus
administration of intravenous contrast.
CONTRAST:  100mL OMNIPAQUE IOHEXOL 300 MG/ML  SOLN

[Series 3: cap with 2 · axial · 0.84mm/px · z∈[-982,-417]mm · 10 of 139 slices shown, 13 images]
[im 13/139  mediastinal]
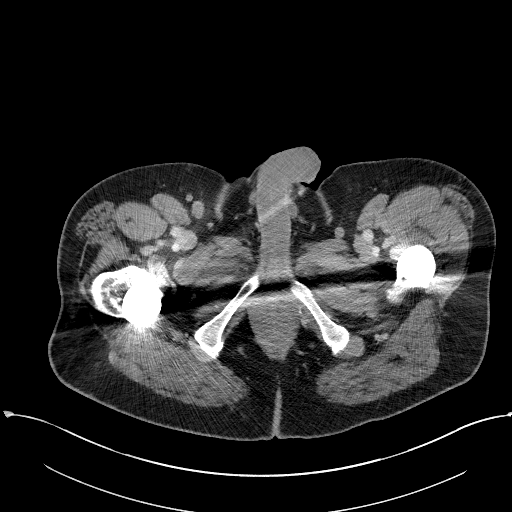
[im 13/139  lung]
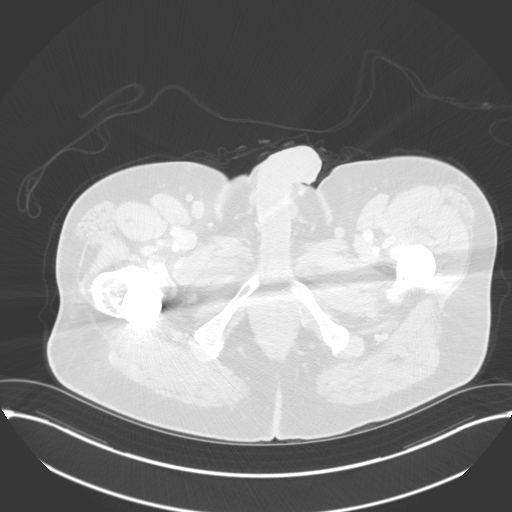
[im 26/139  lung]
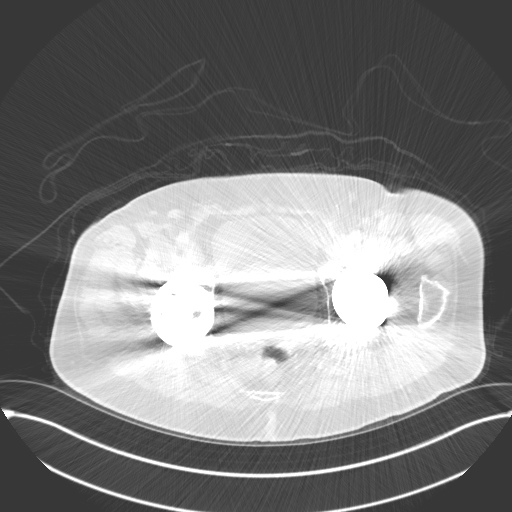
[im 38/139  lung]
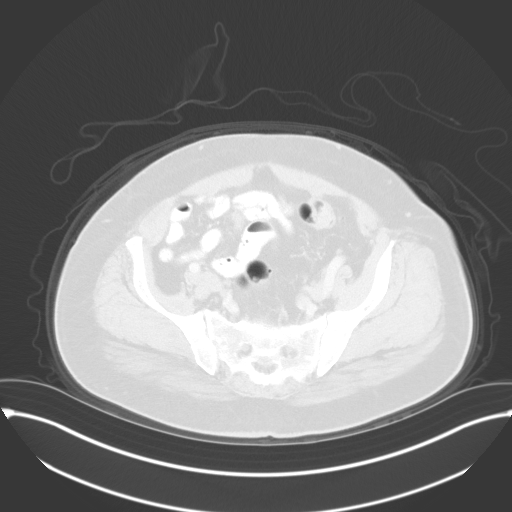
[im 51/139  lung]
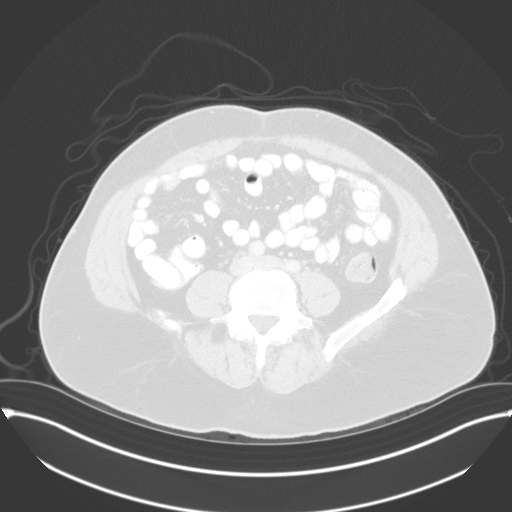
[im 63/139  mediastinal]
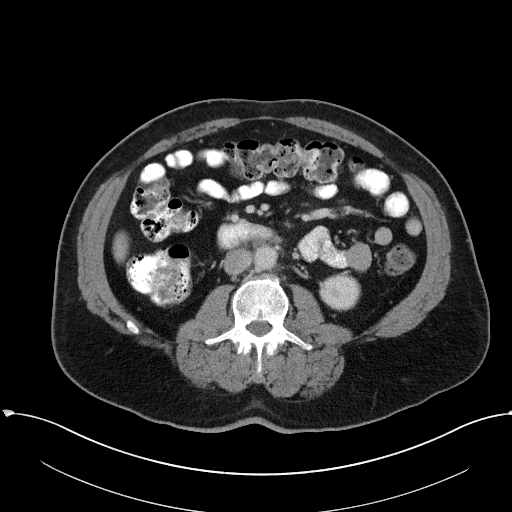
[im 63/139  lung]
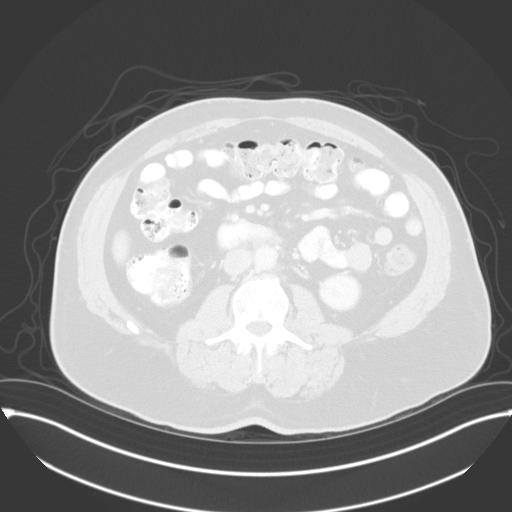
[im 76/139  lung]
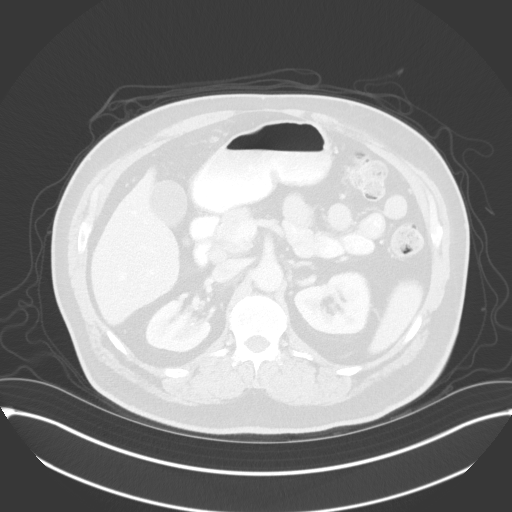
[im 88/139  lung]
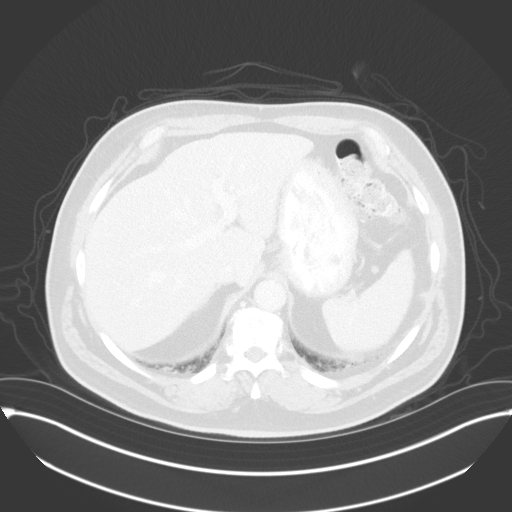
[im 101/139  lung]
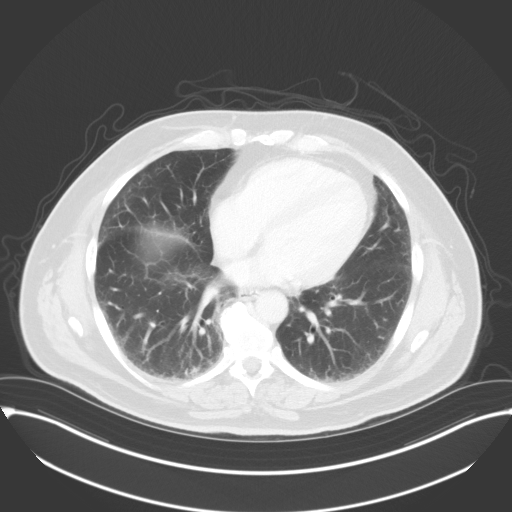
[im 113/139  mediastinal]
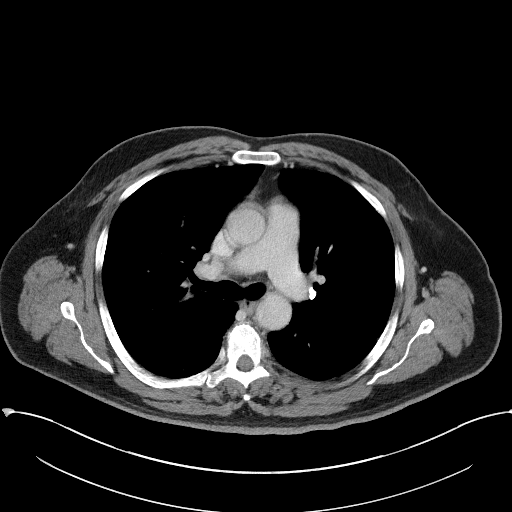
[im 113/139  lung]
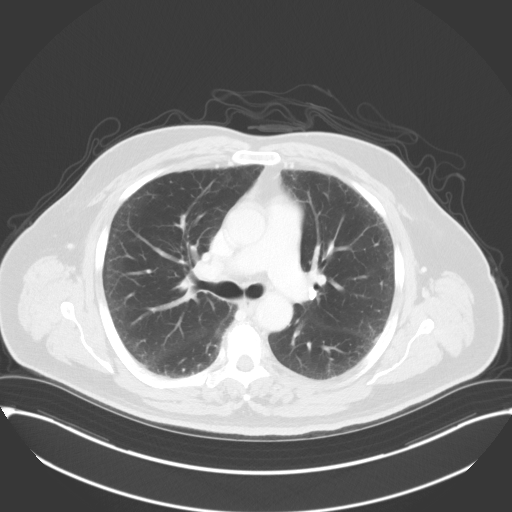
[im 126/139  lung]
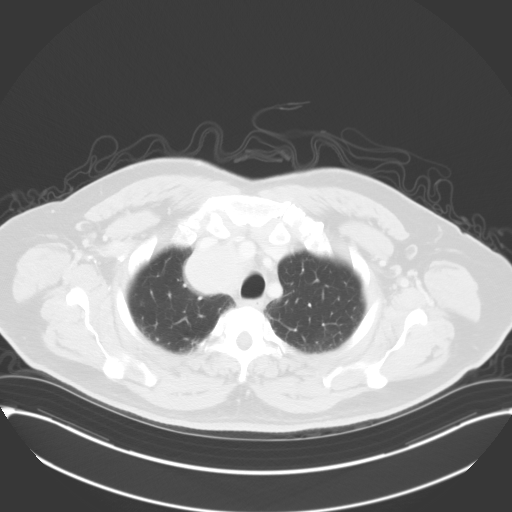

[Series 6: coronals · coronal · 0.78mm/px · 3 of 148 slices shown]
[im 30/148  lung]
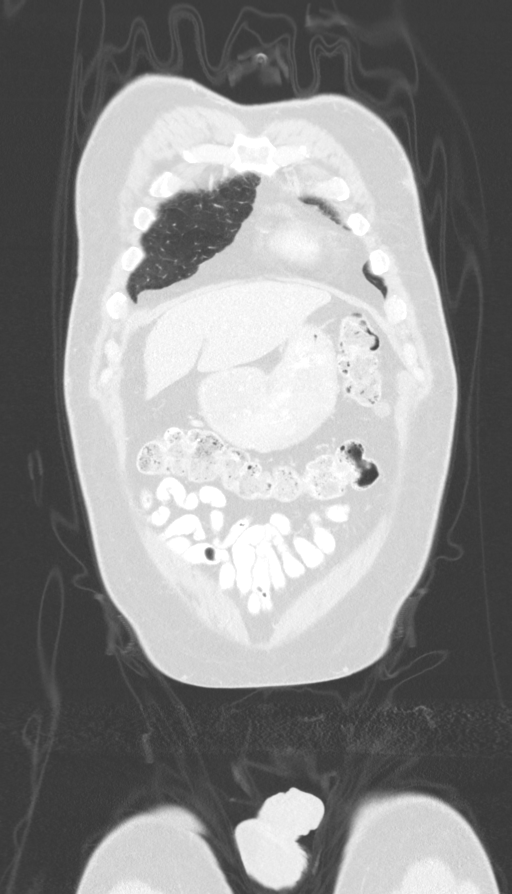
[im 59/148  lung]
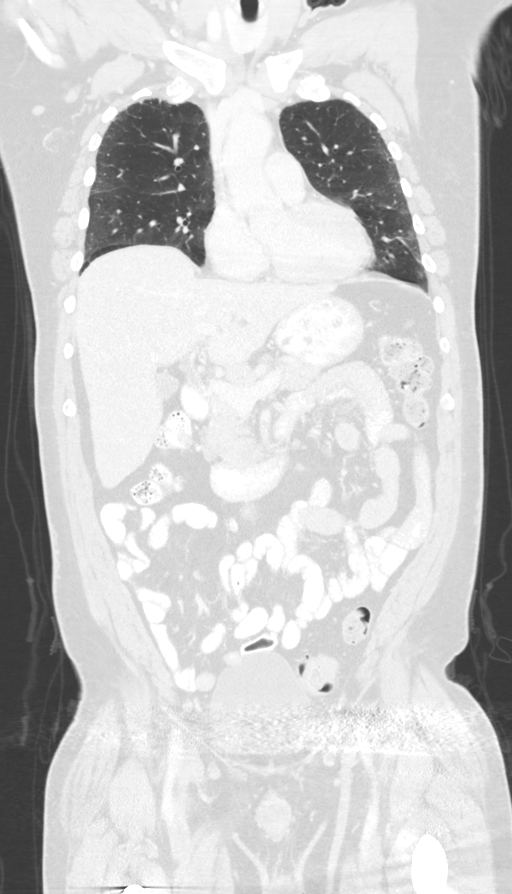
[im 89/148  lung]
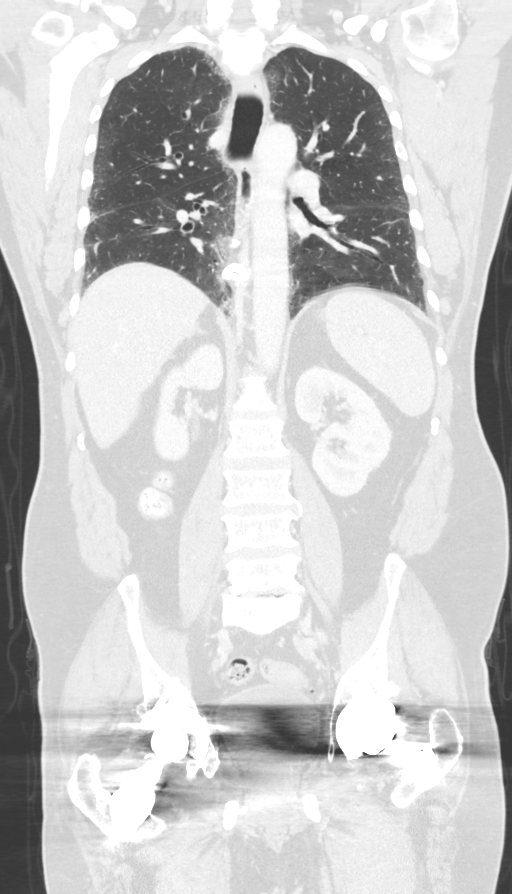

[13 of 36 positions shown; findings below may reference images not displayed]

FINDINGS: CT CHEST FINDINGS

Cardiovascular: Normal heart size. No pericardial effusion. Aorta
and main pulmonary artery normal in caliber. Thoracic aortic
vascular calcifications.

Mediastinum/Nodes: Enlarged right supraclavicular lymph nodes
measuring up to 2.8 cm in thickness (image 5; series 3). There is a
large right paratracheal lymph node measuring 4.8 x 5.9 cm (image
17; series 3). This exerts mass effect on the adjacent trachea.
There is a 1.5 cm prevascular node (image 17; series 3). Small
hiatal hernia. Normal appearance of the esophagus.

Lungs/Pleura: Central airways are patent. Bilateral subpleural
reticular opacities. Associated bronchiectasis. No discrete
pulmonary nodules are identified. No pleural effusion or
pneumothorax.

Musculoskeletal: Thoracic spine degenerative changes. No aggressive
or acute appearing osseous lesions.

CT ABDOMEN PELVIS FINDINGS

Hepatobiliary: Stable subcentimeter too small to characterize
low-attenuation lesion caudate lobe (image 55; series 3).
Gallbladder is unremarkable. No intrahepatic or extrahepatic biliary
ductal dilatation.

Pancreas: Unremarkable

Spleen: Unremarkable

Adrenals/Urinary Tract: Normal adrenal glands. Kidneys enhance
symmetrically with contrast. No hydronephrosis. Urinary bladder
poorly visualized due to streak artifact.

Stomach/Bowel: Sigmoid colonic diverticulosis. No CT evidence for
acute diverticulitis. No evidence for small bowel obstruction. No
free fluid or free intraperitoneal air. Normal morphology of the
stomach.

Vascular/Lymphatic: Normal caliber abdominal aorta. 1.4 cm precaval
node (image 64; series [DATE] cm retrocaval node (image 73; series
[DATE] cm aortocaval node (image 80; series [DATE] cm left
periaortic node (image 81; series 3).

Reproductive: Poorly visualized.

Other: Small fat containing left inguinal hernia.

Musculoskeletal: Bilateral hip arthroplasties. Lower thoracic and
lumbar spine degenerative changes. No aggressive or acute appearing
osseous lesions.
IMPRESSION: 1. Bulky right supraclavicular and mediastinal adenopathy most
compatible with metastatic disease. Additionally there is porta
hepatic and retroperitoneal adenopathy within the abdomen concerning
for metastatic disease.
2. Mild fibrotic changes involving the lungs bilaterally.
3. See dedicated neck CT report.

## 2021-08-23 IMAGING — US US BIOPSY LYMPH NODE
1 series · 13 of 25 positions shown · non-contrast
Comparison: Neck CT - 02/28/2019

INDICATION: No known primary, now with indeterminate right supraclavicular
lymphadenopathy. Please perform CT-guided biopsy for tissue
diagnostic purposes.

EXAM:
ULTRASOUND-GUIDED RIGHT SUPRACLAVICULAR LYMPH NODE BIOPSY
TECHNIQUE: Informed written consent was obtained from the patient after a
discussion of the risks, benefits and alternatives to treatment.
Questions regarding the procedure were encouraged and answered.
Initial ultrasound scanning demonstrated a large (approximately
x 2.2 cm) right supraclavicular lymph node correlating with the
dominant supraclavicular lymph node seen on preceding
contrast-enhanced neck CT image 99, series 3. An ultrasound image
was saved for documentation purposes. The procedure was planned. A
timeout was performed prior to the initiation of the procedure.

[Series 1: us biopsy lymph node · 13 of 25 slices shown]
[im 1/25]
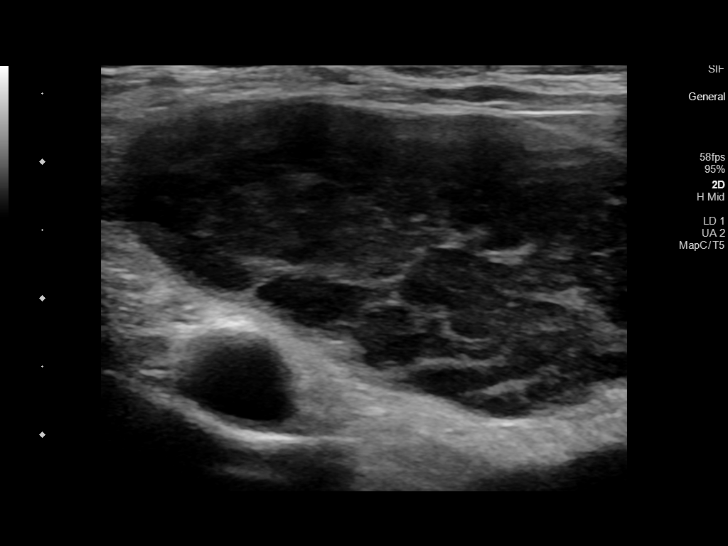
[im 3/25]
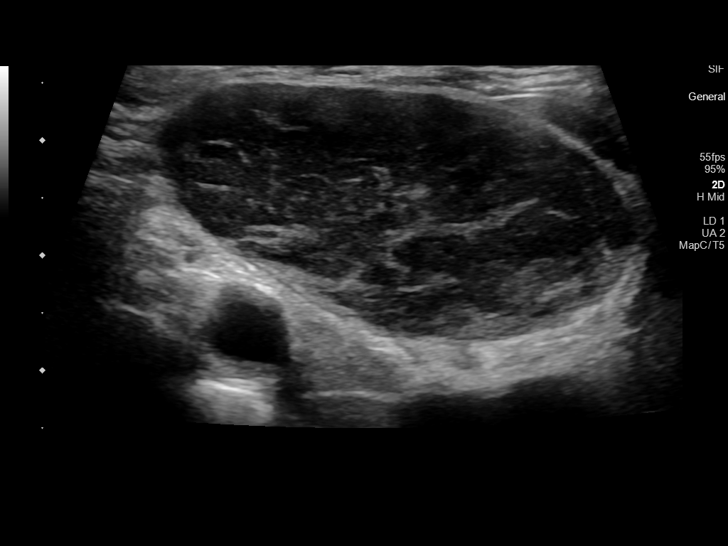
[im 5/25]
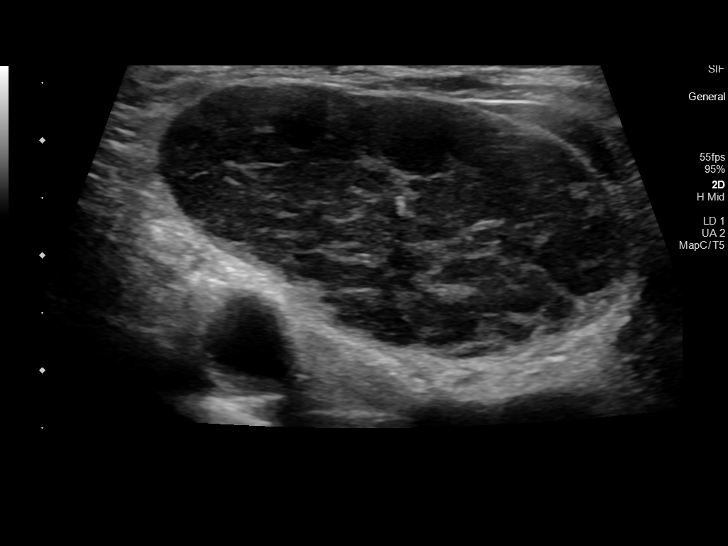
[im 7/25]
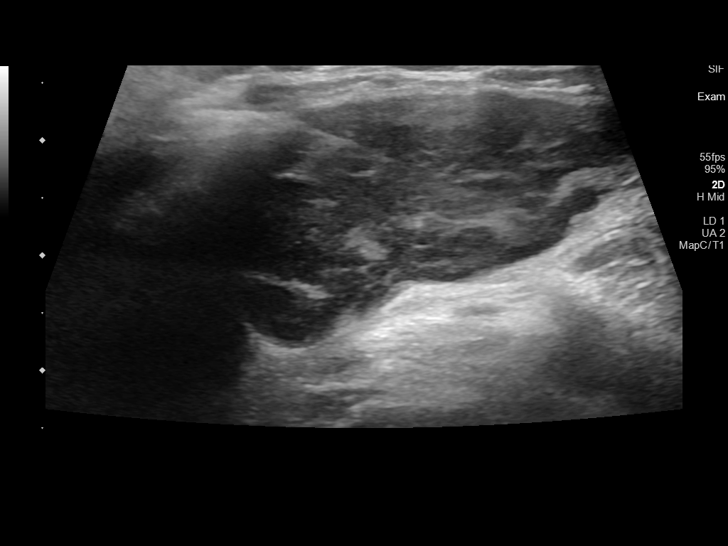
[im 9/25]
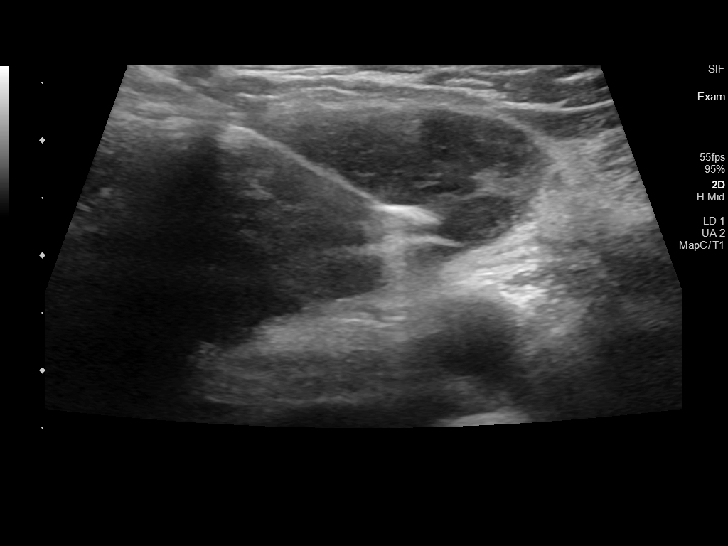
[im 11/25]
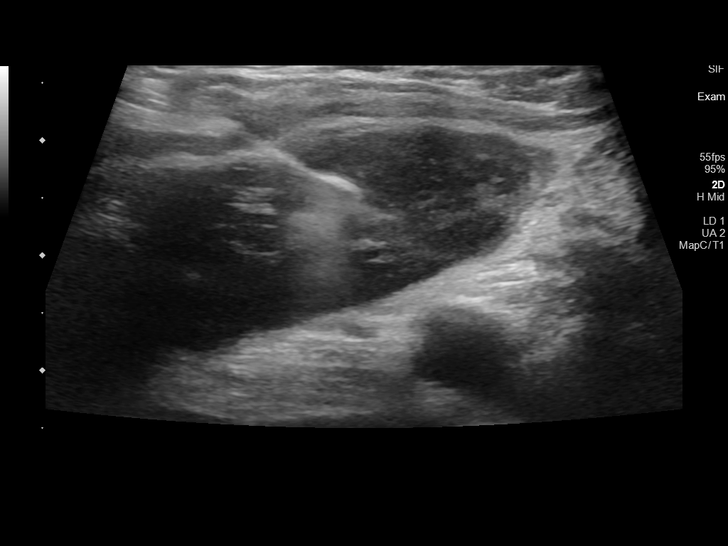
[im 13/25]
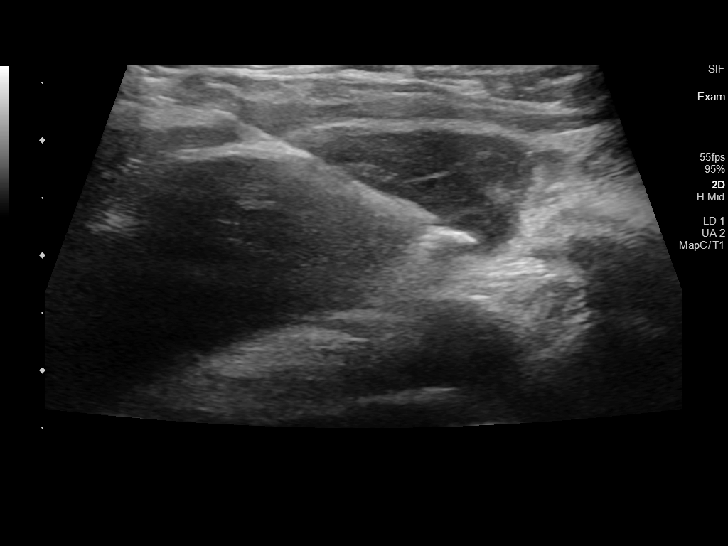
[im 15/25]
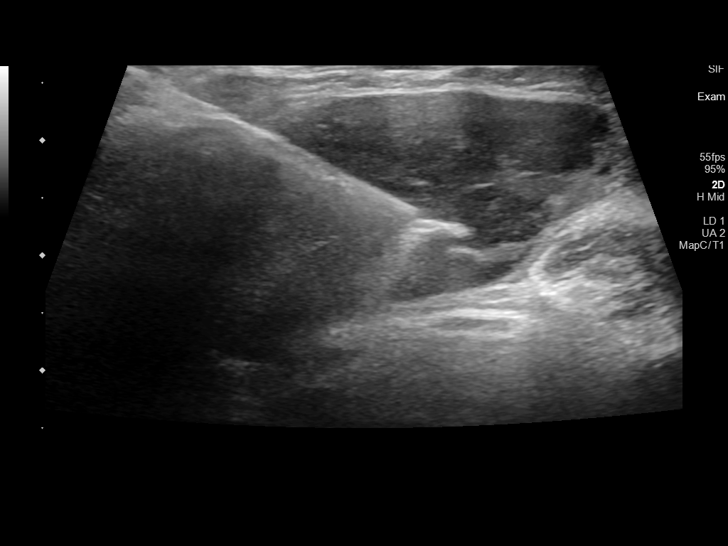
[im 17/25]
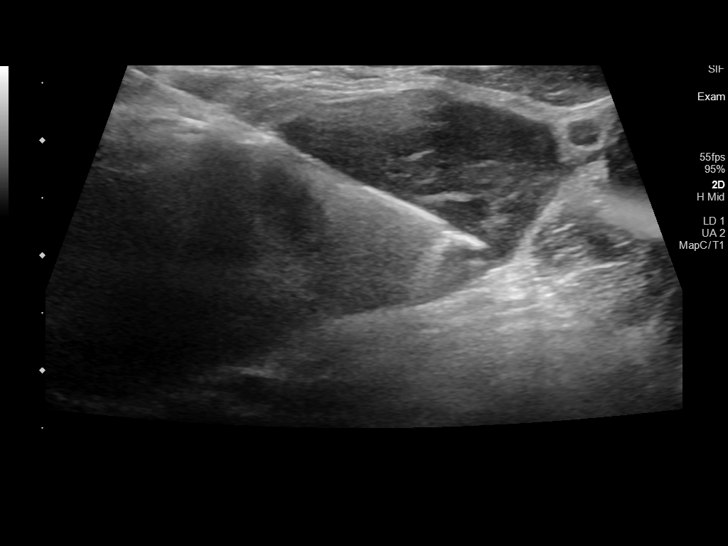
[im 19/25]
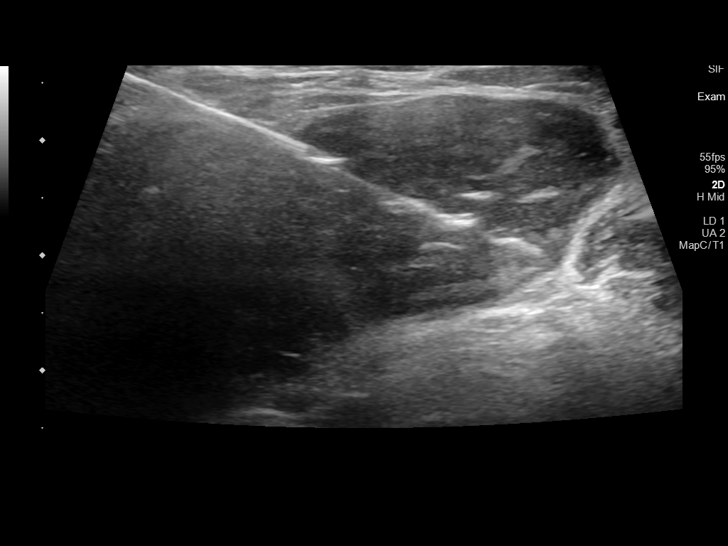
[im 21/25]
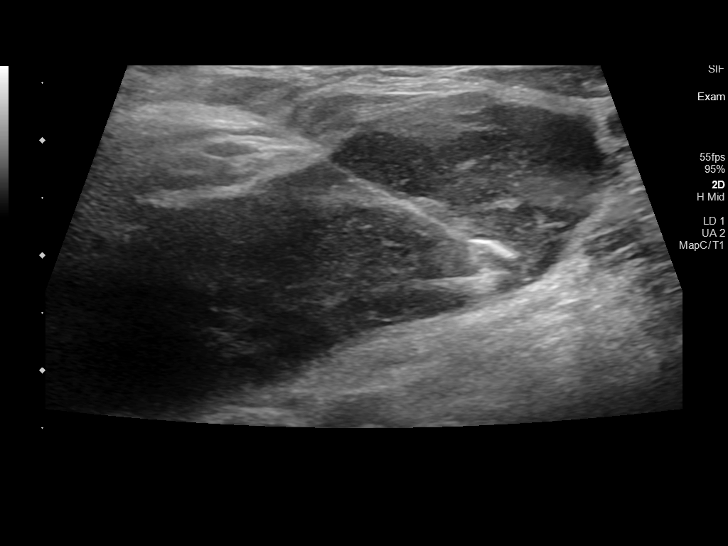
[im 23/25]
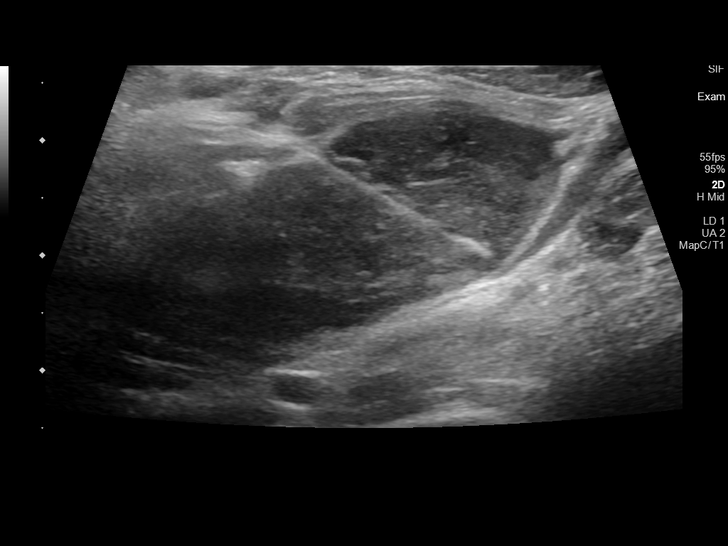
[im 25/25]
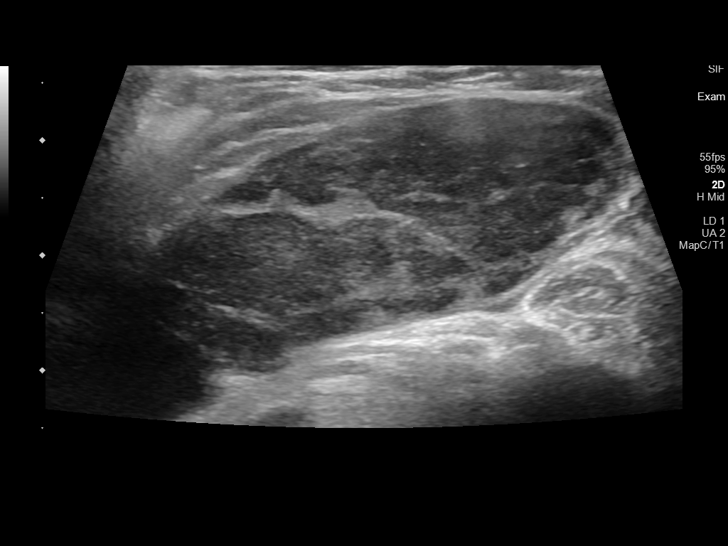

[13 of 25 positions shown; findings below may reference images not displayed]

MEDICATIONS:
None

ANESTHESIA/SEDATION:
Moderate (conscious) sedation was employed during this procedure. A
total of Versed 2 mg and Fentanyl 100 mcg was administered
intravenously.

Moderate Sedation Time: 10 minutes. The patient's level of
consciousness and vital signs were monitored continuously by
radiology nursing throughout the procedure under my direct
supervision.

COMPLICATIONS:
None immediate.
The operative was prepped and draped in the usual sterile fashion,
and a sterile drape was applied covering the operative field. A
timeout was performed prior to the initiation of the procedure.
Local anesthesia was provided with 1% lidocaine with epinephrine.

Under direct ultrasound guidance, an 18 gauge core needle device was
utilized to obtain to obtain 8 core needle biopsies of the dominant
right supraclavicular lymph node.

The samples were placed in saline and submitted to pathology. The
needle was removed and hemostasis was achieved with manual
compression. Post procedure scan was negative for significant
hematoma. A dressing was placed. The patient tolerated the procedure
well without immediate postprocedural complication.
IMPRESSION: Technically successful ultrasound guided biopsy of dominant right
supraclavicular lymph node.

## 2021-09-13 IMAGING — US IR IMAGING GUIDED PORT INSERTION
1 series · 1 of 1 positions shown · non-contrast
Comparison: none

CLINICAL DATA: Hodgkin's LYMPHOMA, ACCESS FOR CHEMOTHERAPY

[Series 1: (id) · 1 of 1 slices shown]
[im 1/1]
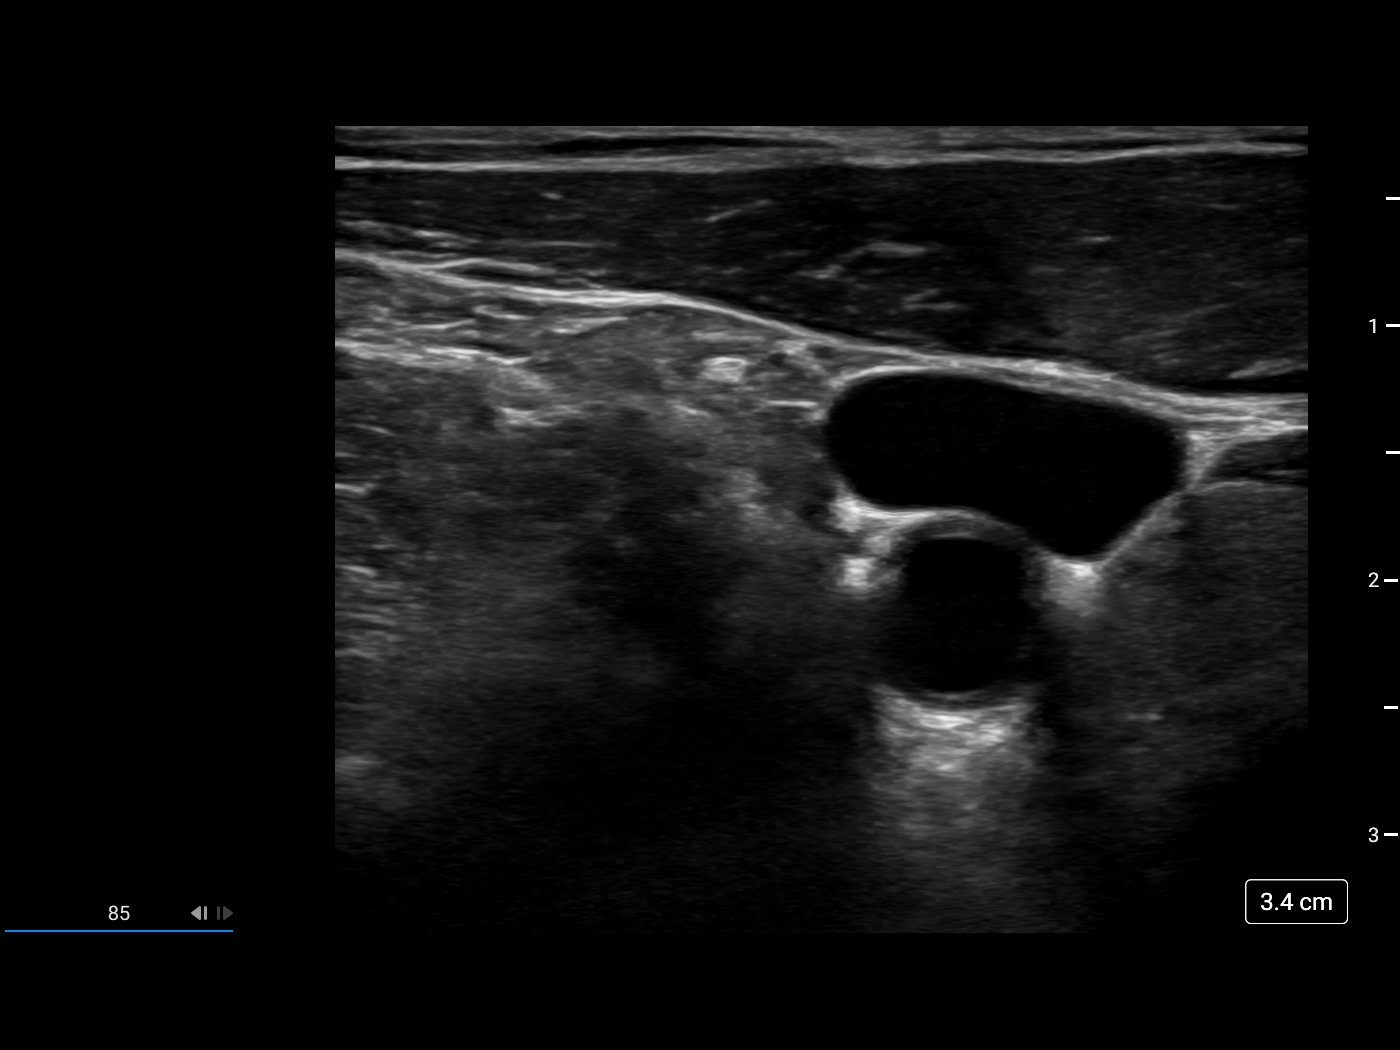

[1 of 1 positions shown; findings below may reference images not displayed]

EXAM:
LEFT INTERNAL JUGULAR SINGLE LUMEN POWER PORT CATHETER INSERTION

Radiologist:  Lise, Moacyr

Guidance:  Ultrasound fluoroscopic

MEDICATIONS:
Ancef 2 g; The antibiotic was administered within an appropriate
time interval prior to skin puncture.

ANESTHESIA/SEDATION:
Versed 2.0 mg IV; Fentanyl 100 mcg IV;

Moderate Sedation Time:  32 minutes

The patient was continuously monitored during the procedure by the
interventional radiology nurse under my direct supervision.

FLUOROSCOPY TIME:  One minutes, 54 seconds (25 mGy)

COMPLICATIONS:
None immediate.

CONTRAST:  None.

PROCEDURE:
Informed consent was obtained from the patient following explanation
of the procedure, risks, benefits and alternatives. The patient
understands, agrees and consents for the procedure. All questions
were addressed. A time out was performed.

Maximal barrier sterile technique utilized including caps, mask,
sterile gowns, sterile gloves, large sterile drape, hand hygiene,
and 2% chlorhexidine scrub.

Under sterile conditions and local anesthesia, left internal jugular
micropuncture venous access was performed. Access was performed with
ultrasound. Images were obtained for documentation of the patent
left internal jugular vein. A guide wire was inserted followed by a
transitional dilator. This allowed insertion of a guide wire and
catheter into the IVC. Measurements were obtained from the SVC / RA
junction back to the left IJ venotomy site. In the left
infraclavicular chest, a subcutaneous pocket was created over the
second anterior rib. This was done under sterile conditions and
local anesthesia. 1% lidocaine with epinephrine was utilized for
this. A 2.5 cm incision was made in the skin. Blunt dissection was
performed to create a subcutaneous pocket over the right pectoralis
major muscle. The pocket was flushed with saline vigorously. There
was adequate hemostasis. The port catheter was assembled and checked
for leakage. The port catheter was secured in the pocket with two
retention sutures. The tubing was tunneled subcutaneously to the
left venotomy site and inserted into the SVC/RA junction through a
valved peel-away sheath. Position was confirmed with fluoroscopy.
Images were obtained for documentation. The patient tolerated the
procedure well. No immediate complications. Incisions were closed in
a two layer fashion with 4 - 0 Vicryl suture. Dermabond was applied
to the skin. The port catheter was accessed, blood was aspirated
followed by saline and heparin flushes. Needle was removed. A dry
sterile dressing was applied.
IMPRESSION: Ultrasound and fluoroscopically guided left internal jugular single
lumen power port catheter insertion. Tip in the SVC/RA junction.
Catheter ready for use.

## 2021-09-18 IMAGING — CT NM PET TUM IMG INITIAL (PI) SKULL BASE T - THIGH
7 of 8 series · 23 of 25 positions shown · non-contrast
Comparison: CT scan 02/28/2019

CLINICAL DATA: Initial treatment strategy for nodular sclerosing
Hodgkin's lymphoma.

EXAM:
NUCLEAR MEDICINE PET SKULL BASE TO THIGH
TECHNIQUE: 9.34 mCi F-18 FDG was injected intravenously. Full-ring PET imaging
was performed from the skull base to thigh after the radiotracer. CT
data was obtained and used for attenuation correction and anatomic
localization.
Fasting blood glucose: 126 mg/dl

[Series 3: pet sk_thigh ac · axial · 5.0mm · 4.07mm/px · z∈[-1004,-60]mm · 6 of 237 slices shown]
[im 1/237]
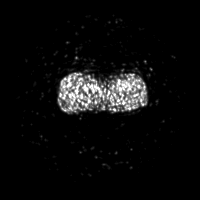
[im 48/237]
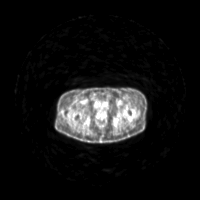
[im 95/237]
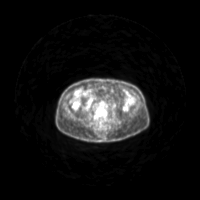
[im 142/237]
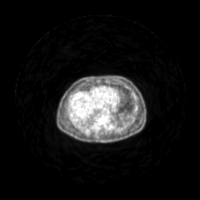
[im 189/237]
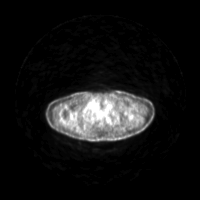
[im 237/237]
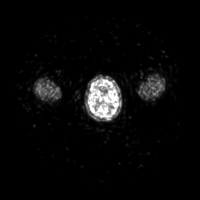

[Series 4: ct sk_thigh 5.0 b31f · axial · 5.0mm · 0.98mm/px · z∈[-768,-60]mm · 4 of 237 slices shown]
[im 60/237]
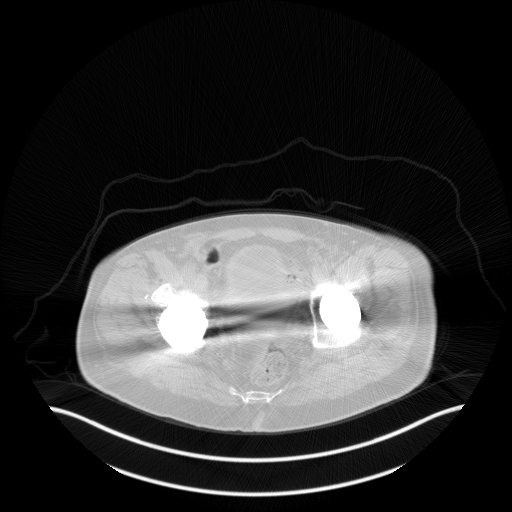
[im 119/237]
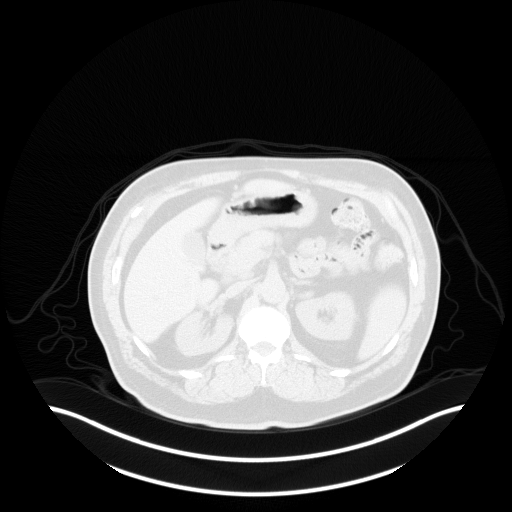
[im 178/237]
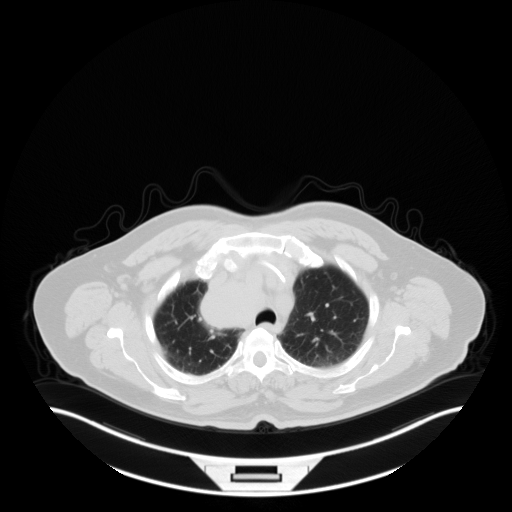
[im 237/237  brain]
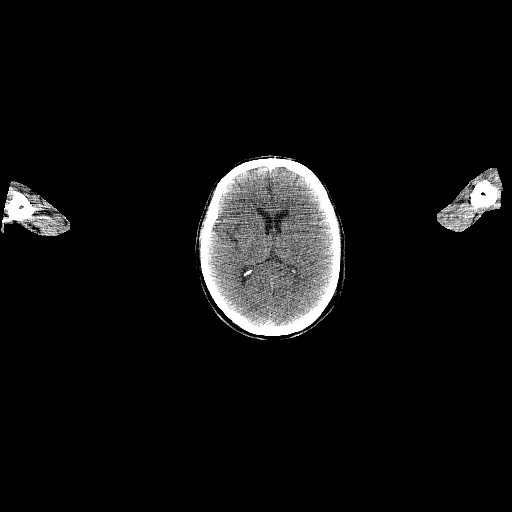

[Series 5: pet sk_thigh nac · axial · 5.0mm · 4.07mm/px · z∈[-1004,-60]mm · 5 of 237 slices shown]
[im 1/237]
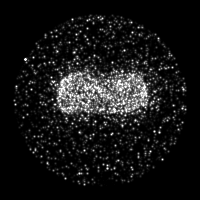
[im 60/237]
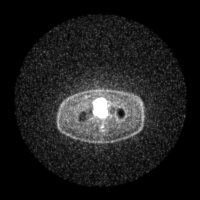
[im 119/237]
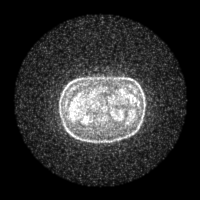
[im 178/237]
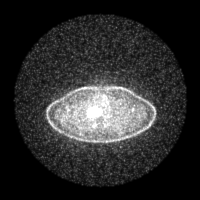
[im 237/237]
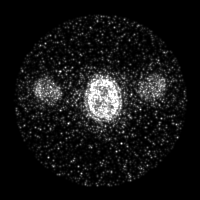

[Series 8: ct sk_thigh 5.0 (id) lung_bone · axial · 5.0mm · 0.66mm/px · 1 of 63 slices shown]
[im 1/63  bone]
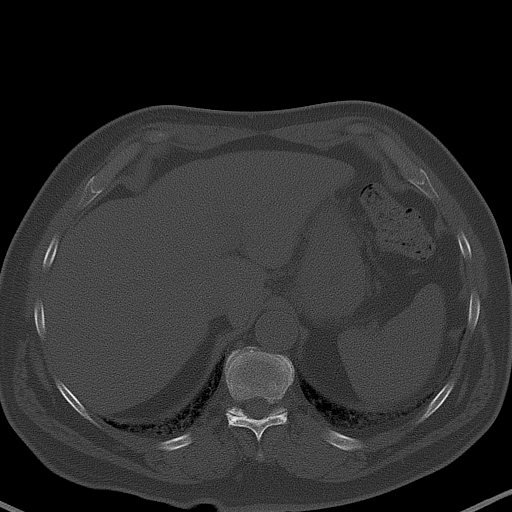

[Series 603: mip range 2 · coronal · 1.96mm/px · 1 of 32 slices shown]
[im 1/32]
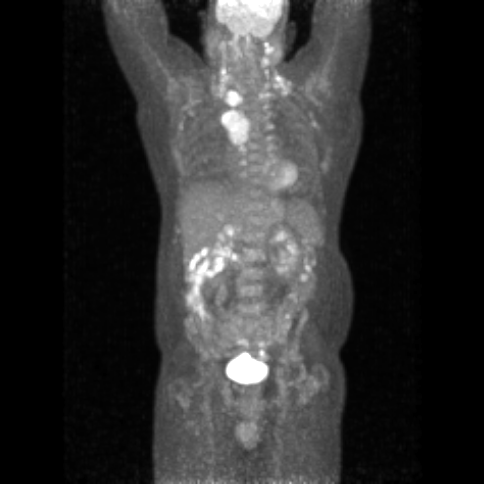

[Series 605: range-ct sk_thigh 5.0 (id)<alpha range> · 5 of 227 slices shown]
[im 1/227]
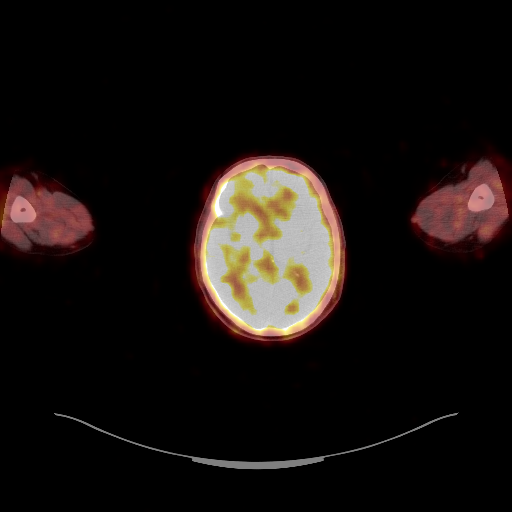
[im 57/227]
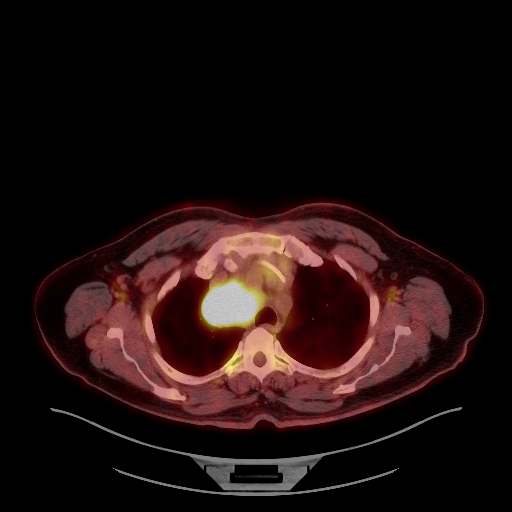
[im 114/227]
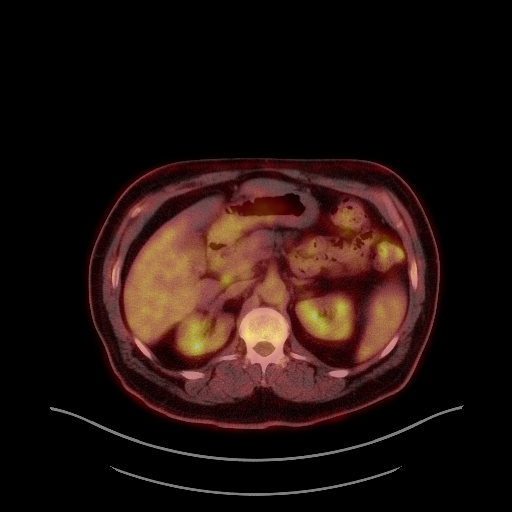
[im 170/227]
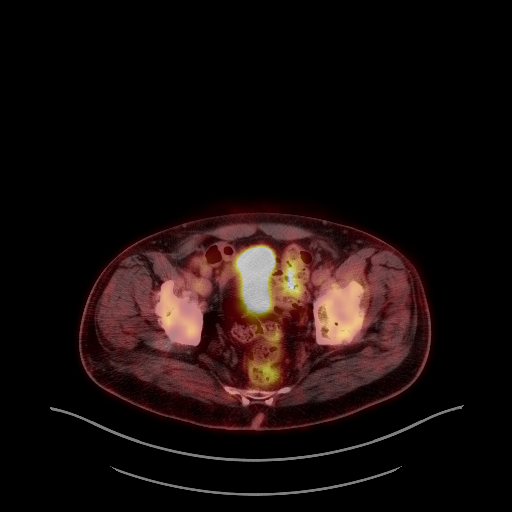
[im 227/227]
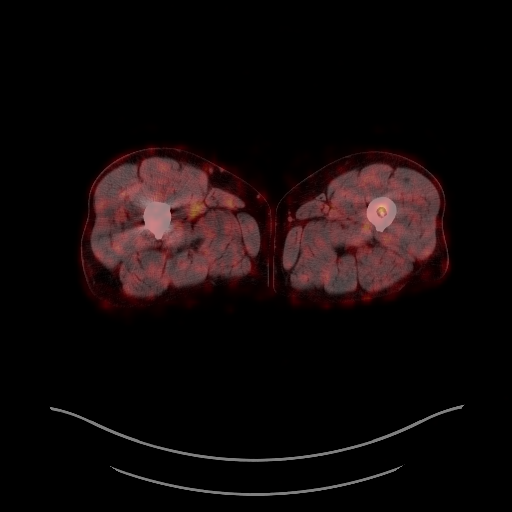

[Series 1088: results mm oncology reading · 1.0mm · 0.50mm/px · 1 of 8 slices shown]
[im 1/8]
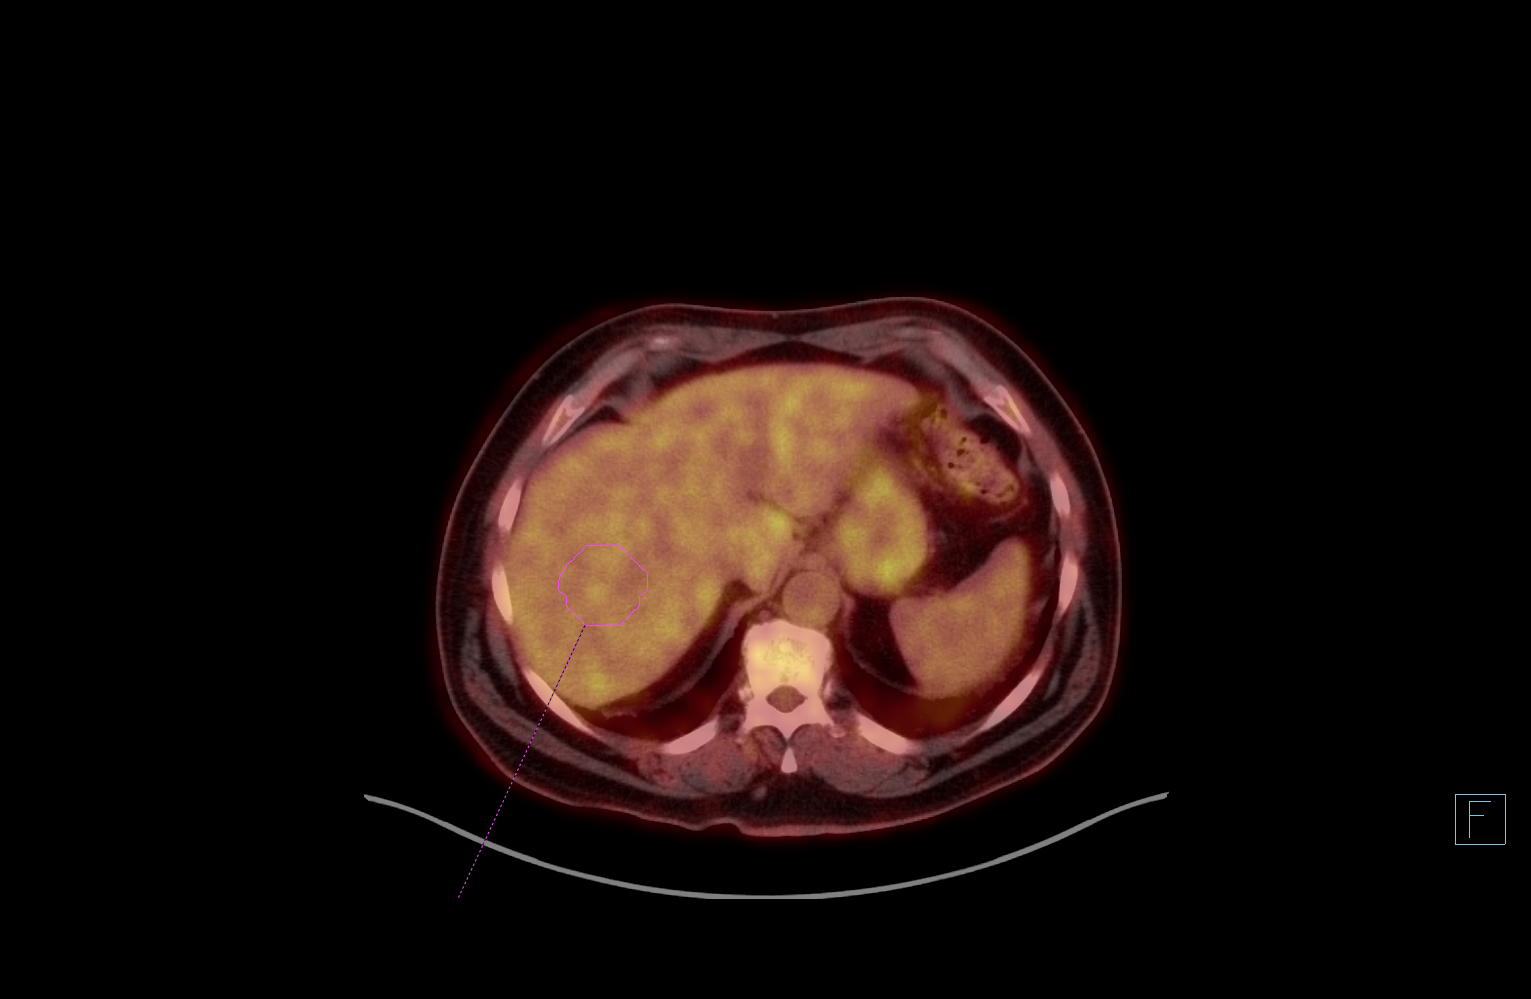

[23 of 25 positions shown; findings below may reference images not displayed]

FINDINGS: Mediastinal blood pool activity: SUV max

Liver activity: SUV max

NECK: Multi station neck adenopathy as demonstrated on prior neck
CT.

Posterior cervical lymph node on the left has an SUV max of 10.02.
Left-sided level 3 lymph node has an SUV max of 7.99.

Large supraclavicular nodal mass on the right measures 22 mm and has
an SUV max of 12.28.

Areas of hypermetabolic brown fat are also noted in the
supraclavicular fossa regions bilaterally.

Incidental CT findings: none

CHEST: 6 cm paratracheal nodal mass has an SUV max of 11.73.

No axillary or subpectoral adenopathy.

No hypermetabolic lung lesions are identified.

Incidental CT findings: Interstitial lung disease, emphysema and
scattered calcified granulomas are noted.

ABDOMEN/PELVIS: No enlargement of the liver spleen. No
hypermetabolism or focal lesions.

Scattered mildly hypermetabolic retroperitoneal lymph nodes. 10 mm
node posterior to the IVC has an SUV max of [REDACTED] mm paraduodenal
lymph node has an SUV max of 5.46.

No obvious pelvic or inguinal adenopathy.

Incidental CT findings: none

SKELETON: No definite findings for osseous lymphoma.

Incidental CT findings: none
IMPRESSION: 1. Hypermetabolic lymphadenopathy involving the neck, chest and
abdomen consistent with known Hodgkin's lymphoma. [HOSPITAL] 5.
2. No findings for osseous metastatic disease.

## 2021-10-09 IMAGING — DX DG CHEST 2V
2 series · 2 of 2 positions shown · non-contrast
Comparison: PET-CT 04/03/2019. CT chest 02/28/2019. Chest x-ray
05/06/2005.

CLINICAL DATA: Cough.  History of lymphoma.

EXAM:
CHEST - 2 VIEW

[chest pa]
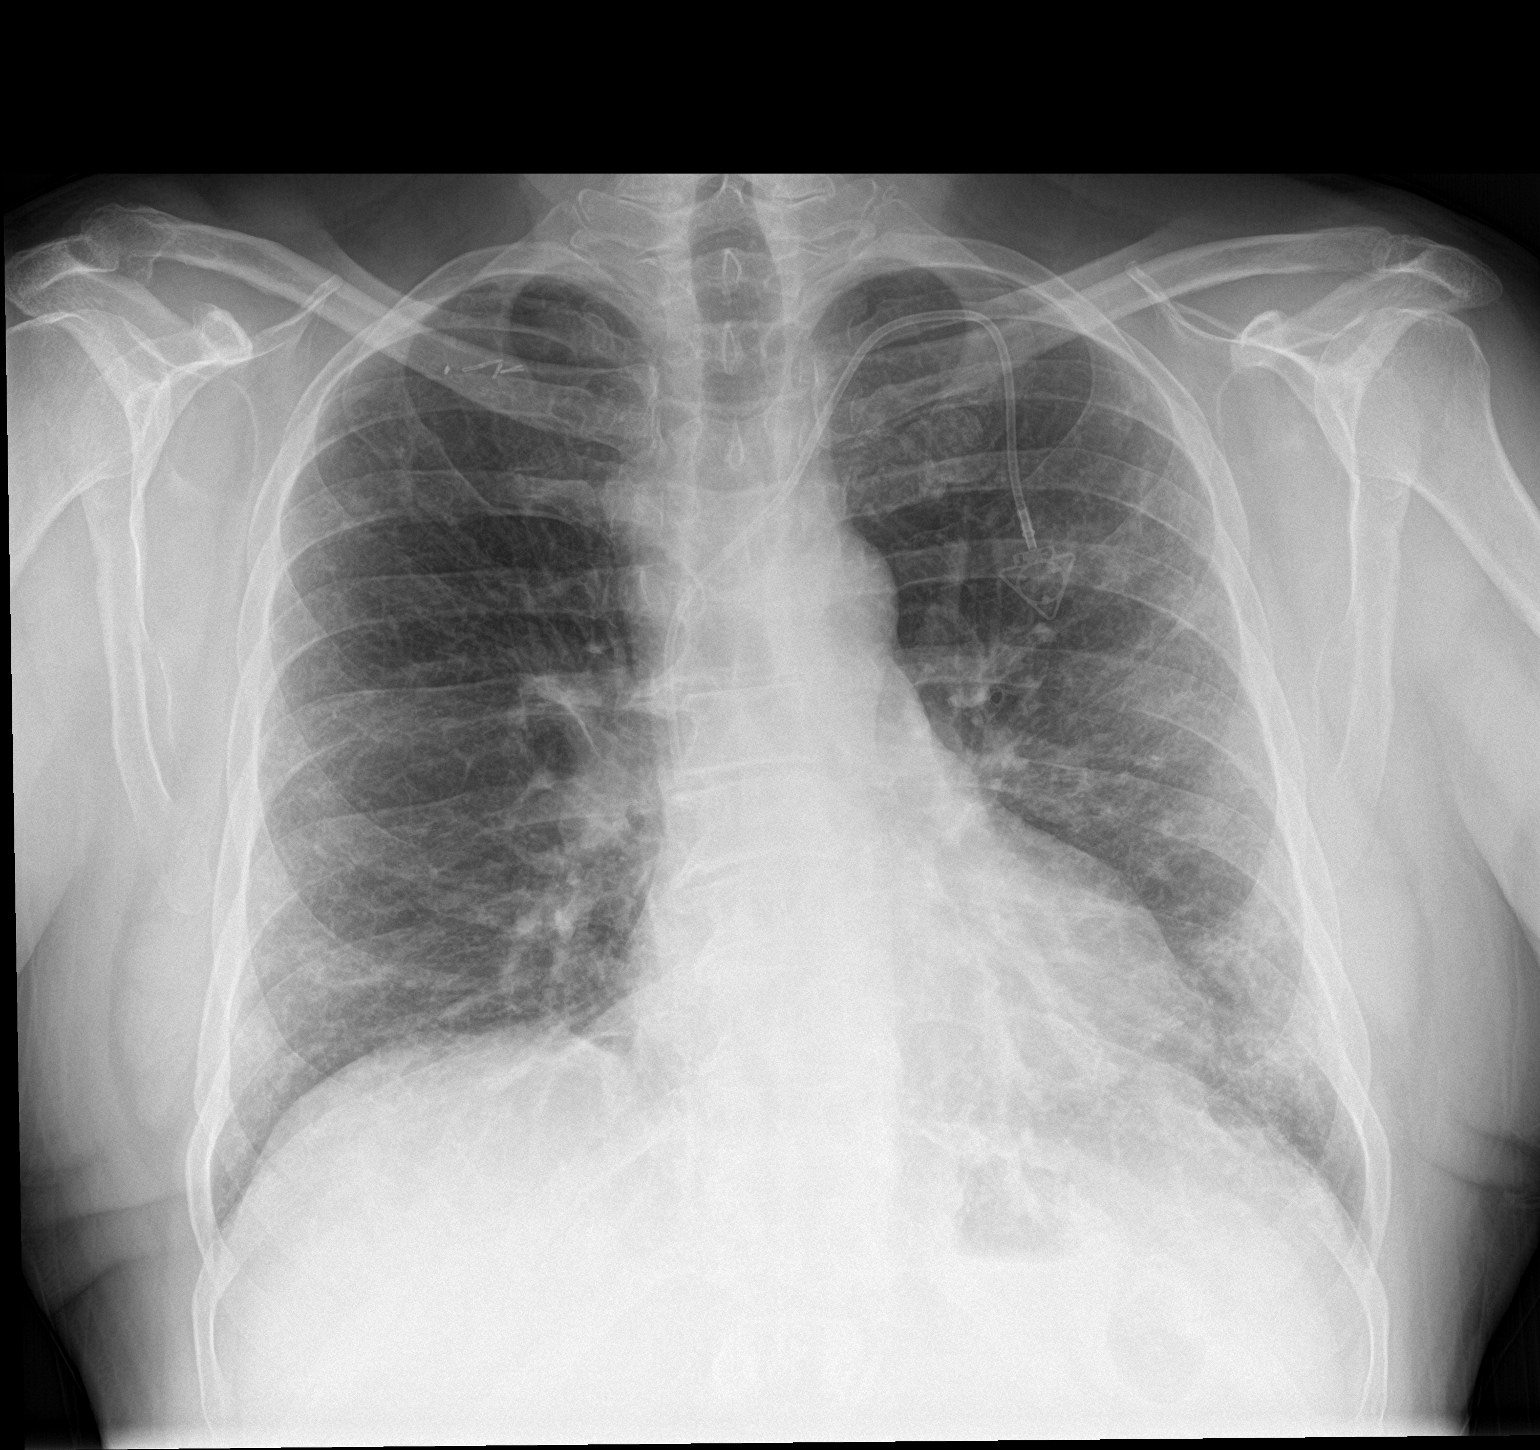

[chest lat]
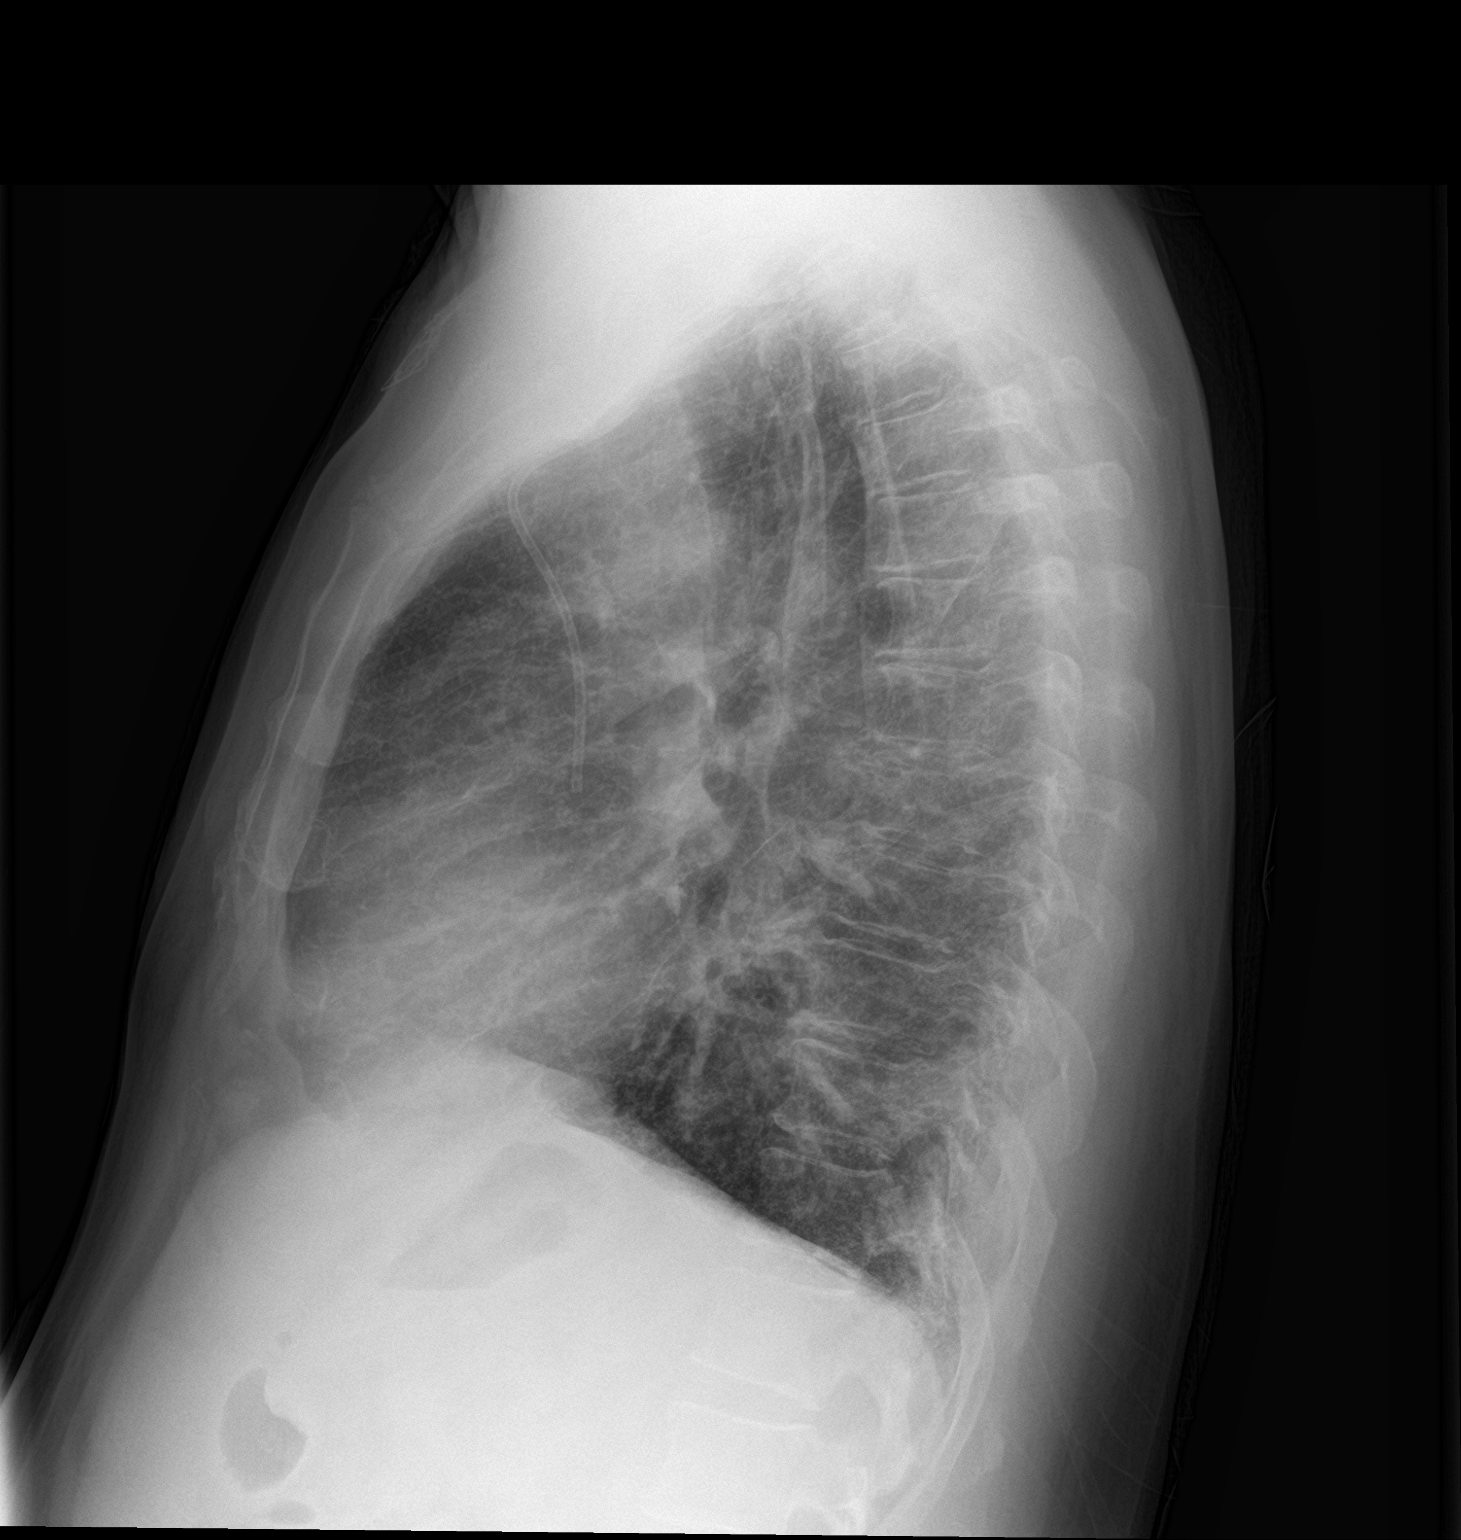

[2 of 2 positions shown; findings below may reference images not displayed]

FINDINGS: Surgical clips right upper chest. PowerPort catheter noted with tip
over superior vena cava. Significant reduction in size of
mediastinal adenopathy. Heart size normal. Low lung volumes with
bibasilar atelectasis. Mild infiltrates in the left mid lung and
lung bases cannot be excluded. No pleural effusion or pneumothorax.
Diffuse osteopenia degenerative change thoracic spine. No acute bony
abnormality.
IMPRESSION: PowerPort catheter noted with tip over SVC.

2.  Significant reduction in size of mediastinal adenopathy.

3. Low lung volumes with bibasilar atelectasis. Mild infiltrates in
the left mid lung and lung bases cannot be excluded.

## 2021-10-31 IMAGING — DX DG CHEST 1V PORT
1 series · 1 of 1 positions shown · non-contrast
Comparison: April 24, 2019

CLINICAL DATA: Shortness of breath and fever

EXAM:
PORTABLE CHEST 1 VIEW

[chest ap]
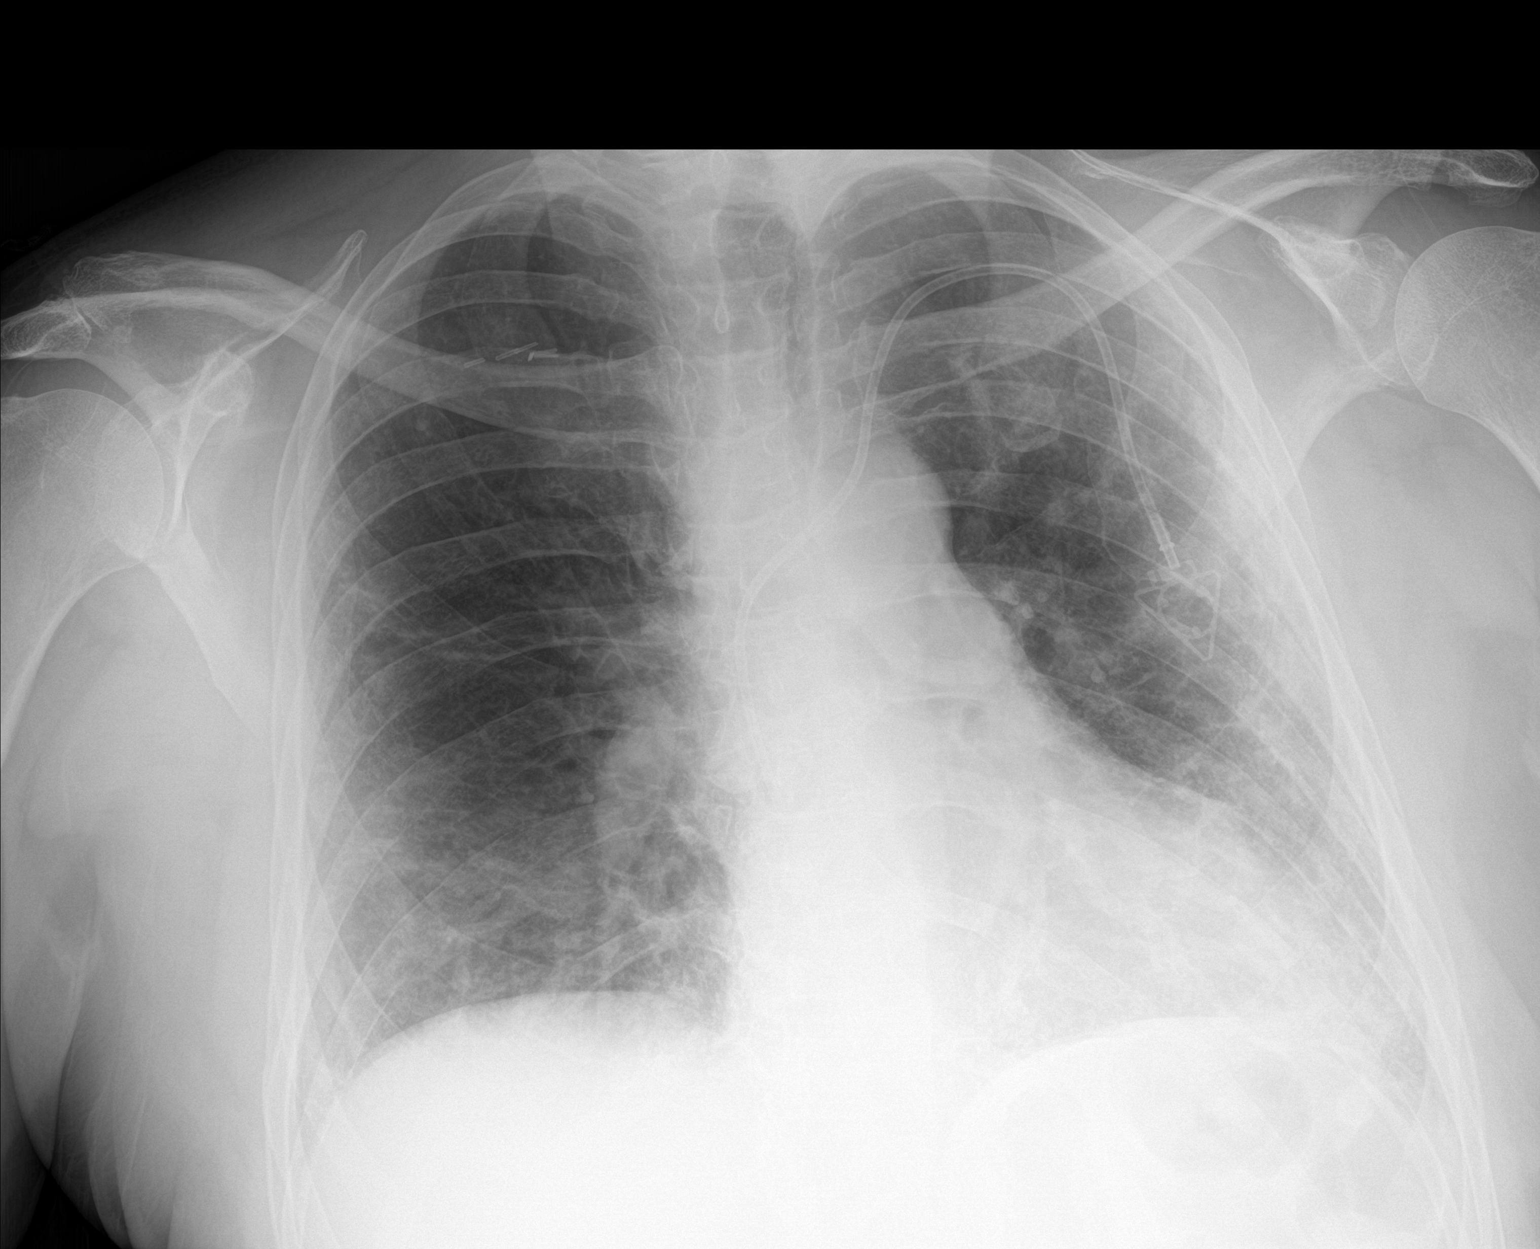

[1 of 1 positions shown; findings below may reference images not displayed]

FINDINGS: The heart size and mediastinal contours are unchanged. Aortic knob
calcifications. A left-sided MediPort catheter seen with the tip at
the superior cavoatrial junction. Mildly increased interstitial
markings are again noted at both lung bases. Overlying surgical
clips are seen. No acute osseous abnormality.
IMPRESSION: Subsegmental atelectasis or chronic interstitial thickening at both
lung bases.

No acute cardiopulmonary process.

## 2021-11-05 IMAGING — DX DG CHEST 1V PORT
1 series · 1 of 1 positions shown · non-contrast
Comparison: 05/17/2019

CLINICAL DATA: [id number] pneumonia

EXAM:
PORTABLE CHEST 1 VIEW

[chest]
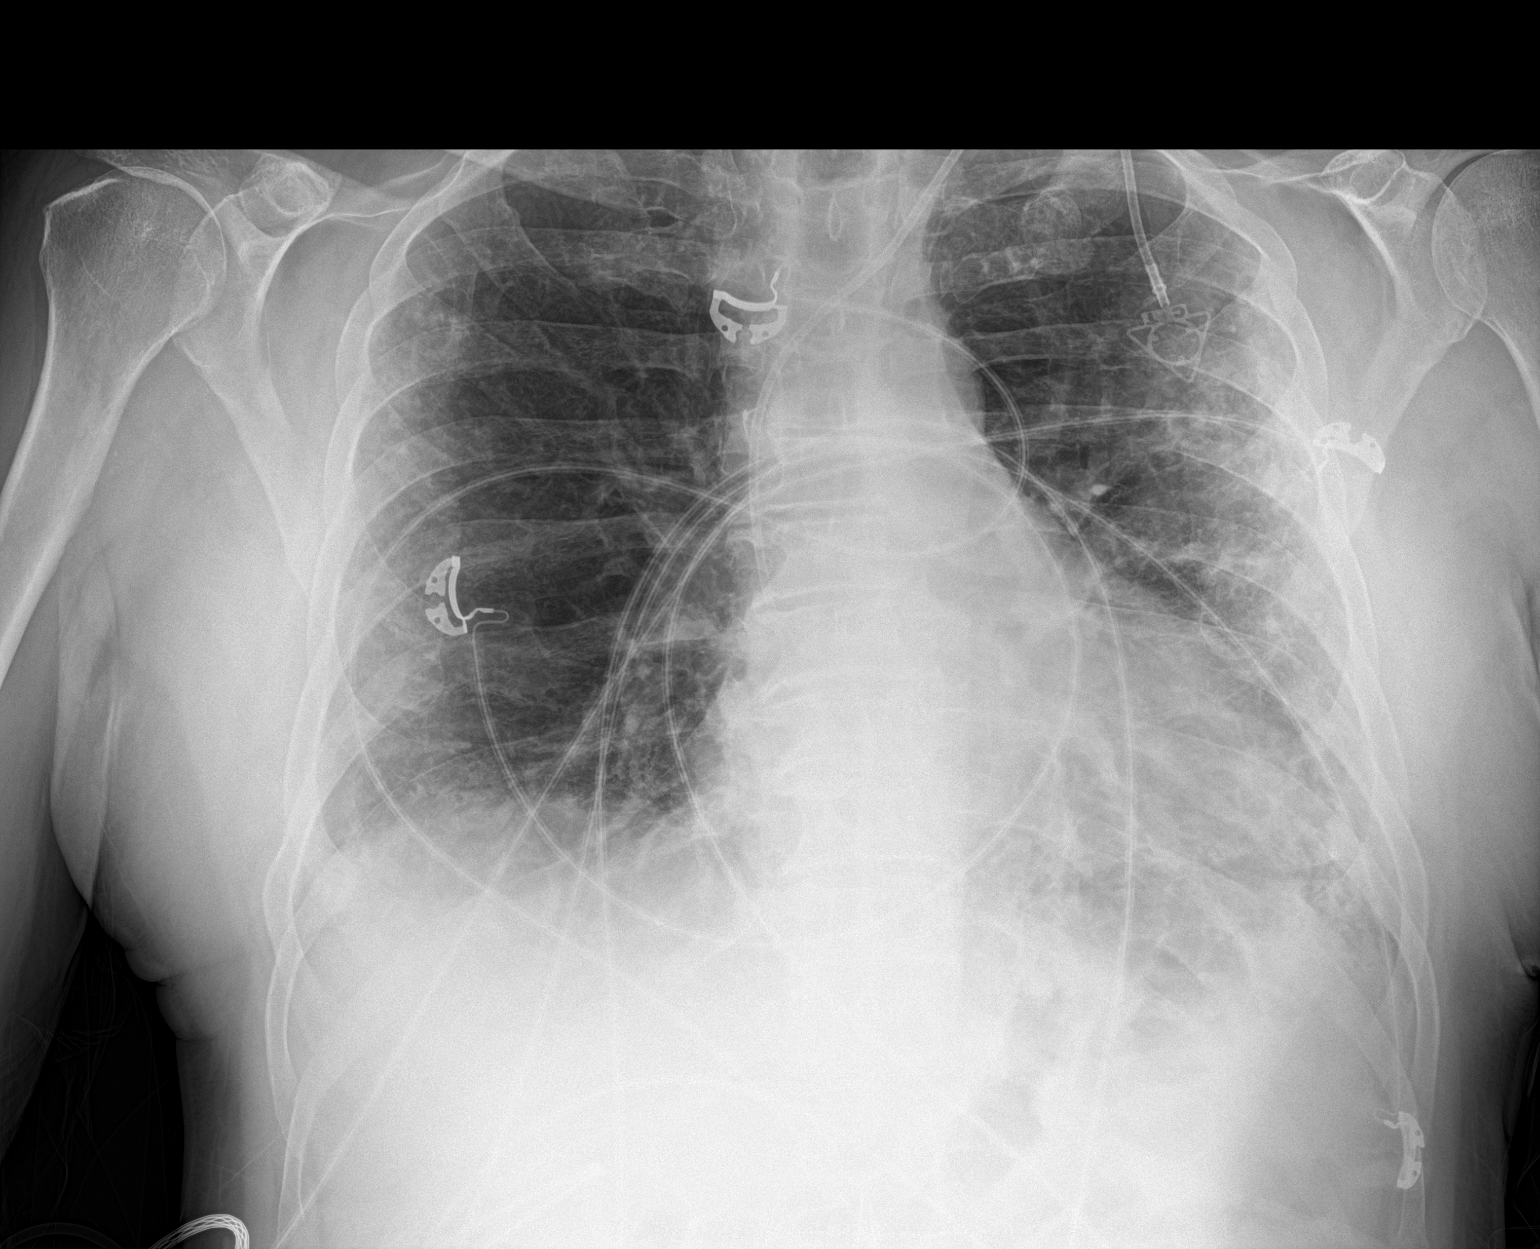

[1 of 1 positions shown; findings below may reference images not displayed]

FINDINGS: Cardiac shadow is stable. Left chest wall port is again seen. Patchy
subpleural densities are noted similar to that seen on prior CT
examination consistent with the given clinical history. The overall
appearance is stable from the prior exam. No sizable effusion is
noted. No bony abnormality is seen.
IMPRESSION: Bilateral peripheral opacities consistent with the given clinical
history.

## 2021-11-29 IMAGING — CT NM PET TUM IMG RESTAG (PS) SKULL BASE T - THIGH
1 of 6 series · 1 of 25 positions shown · non-contrast
Comparison: PET-CT 04/03/2019.

CLINICAL DATA: Subsequent treatment strategy for Hodgkin's
lymphoma.

EXAM:
NUCLEAR MEDICINE PET SKULL BASE TO THIGH
TECHNIQUE: 9.03 mCi F-18 FDG was injected intravenously. Full-ring PET imaging
was performed from the skull base to thigh after the radiotracer. CT
data was obtained and used for attenuation correction and anatomic
localization.
Fasting blood glucose: 170 mg/dl

[Series 4: ct sk_thigh 5.0 b31f · axial · 5.0mm · 0.98mm/px · 1 of 244 slices shown]
[im 244/244  brain]
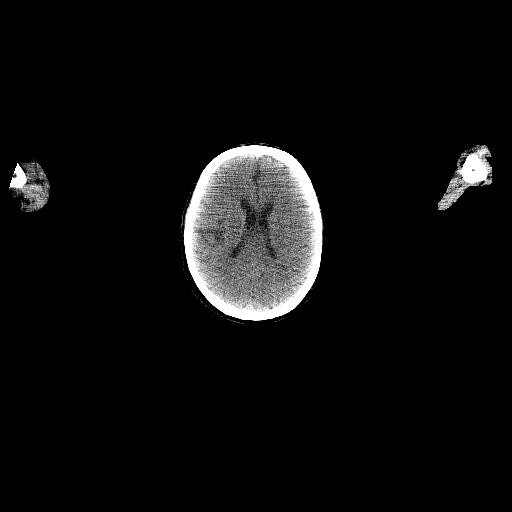

[1 of 25 positions shown; findings below may reference images not displayed]

FINDINGS: Mediastinal blood pool activity: SUV max

Liver activity: SUV max

NECK: No hypermetabolic lymph nodes in the neck. In addition,
previously noted right supraclavicular lymph node has significantly
decreased in size, currently measuring 1.2 cm in short axis (axial
image 54 of series 4) and is no longer hypermetabolic (SUVmax =
versus 12.3 on the prior)) .

Incidental CT findings: none

CHEST: No hypermetabolic mediastinal or hilar nodes. Previously
noted enlarged right paratracheal lymph node has decreased in size
and currently measures 2.3 cm in short axis (axial image 65 of
series 4) and is no longer hypermetabolic (SUVmax = 2.2 versus
on the prior)) . No suspicious pulmonary nodules on the CT scan.

Incidental CT findings: Left internal jugular single-lumen porta
cath with tip terminating in the superior aspect of the right
atrium. Aortic atherosclerosis.

ABDOMEN/PELVIS: No abnormal hypermetabolic activity within the
liver, pancreas, adrenal glands, or spleen. No hypermetabolic lymph
nodes in the abdomen or pelvis.

Incidental CT findings: none

SKELETON: Mild diffuse hypermetabolism throughout the visualized
skeletal structures without destructive osseous lesions, most likely
to reflect rebound marrow response. No focal hypermetabolic activity
to suggest skeletal metastasis. Bilateral hip arthroplasties.

Incidental CT findings: none
IMPRESSION: 1. Complete metabolic response to therapy ([HOSPITAL] 1), as above.
2. Additional incidental findings, as above.

## 2021-12-16 IMAGING — DX DG CHEST 2V
2 series · 2 of 2 positions shown · non-contrast
Comparison: Chest x-ray dated 05/21/2019 and 04/24/2019 and CT scan
of the chest dated 05/17/2019

CLINICAL DATA: Cough for 2 weeks. Shortness of breath. History of
MFYCR-[id number].

EXAM:
CHEST - 2 VIEW

[chest pa]
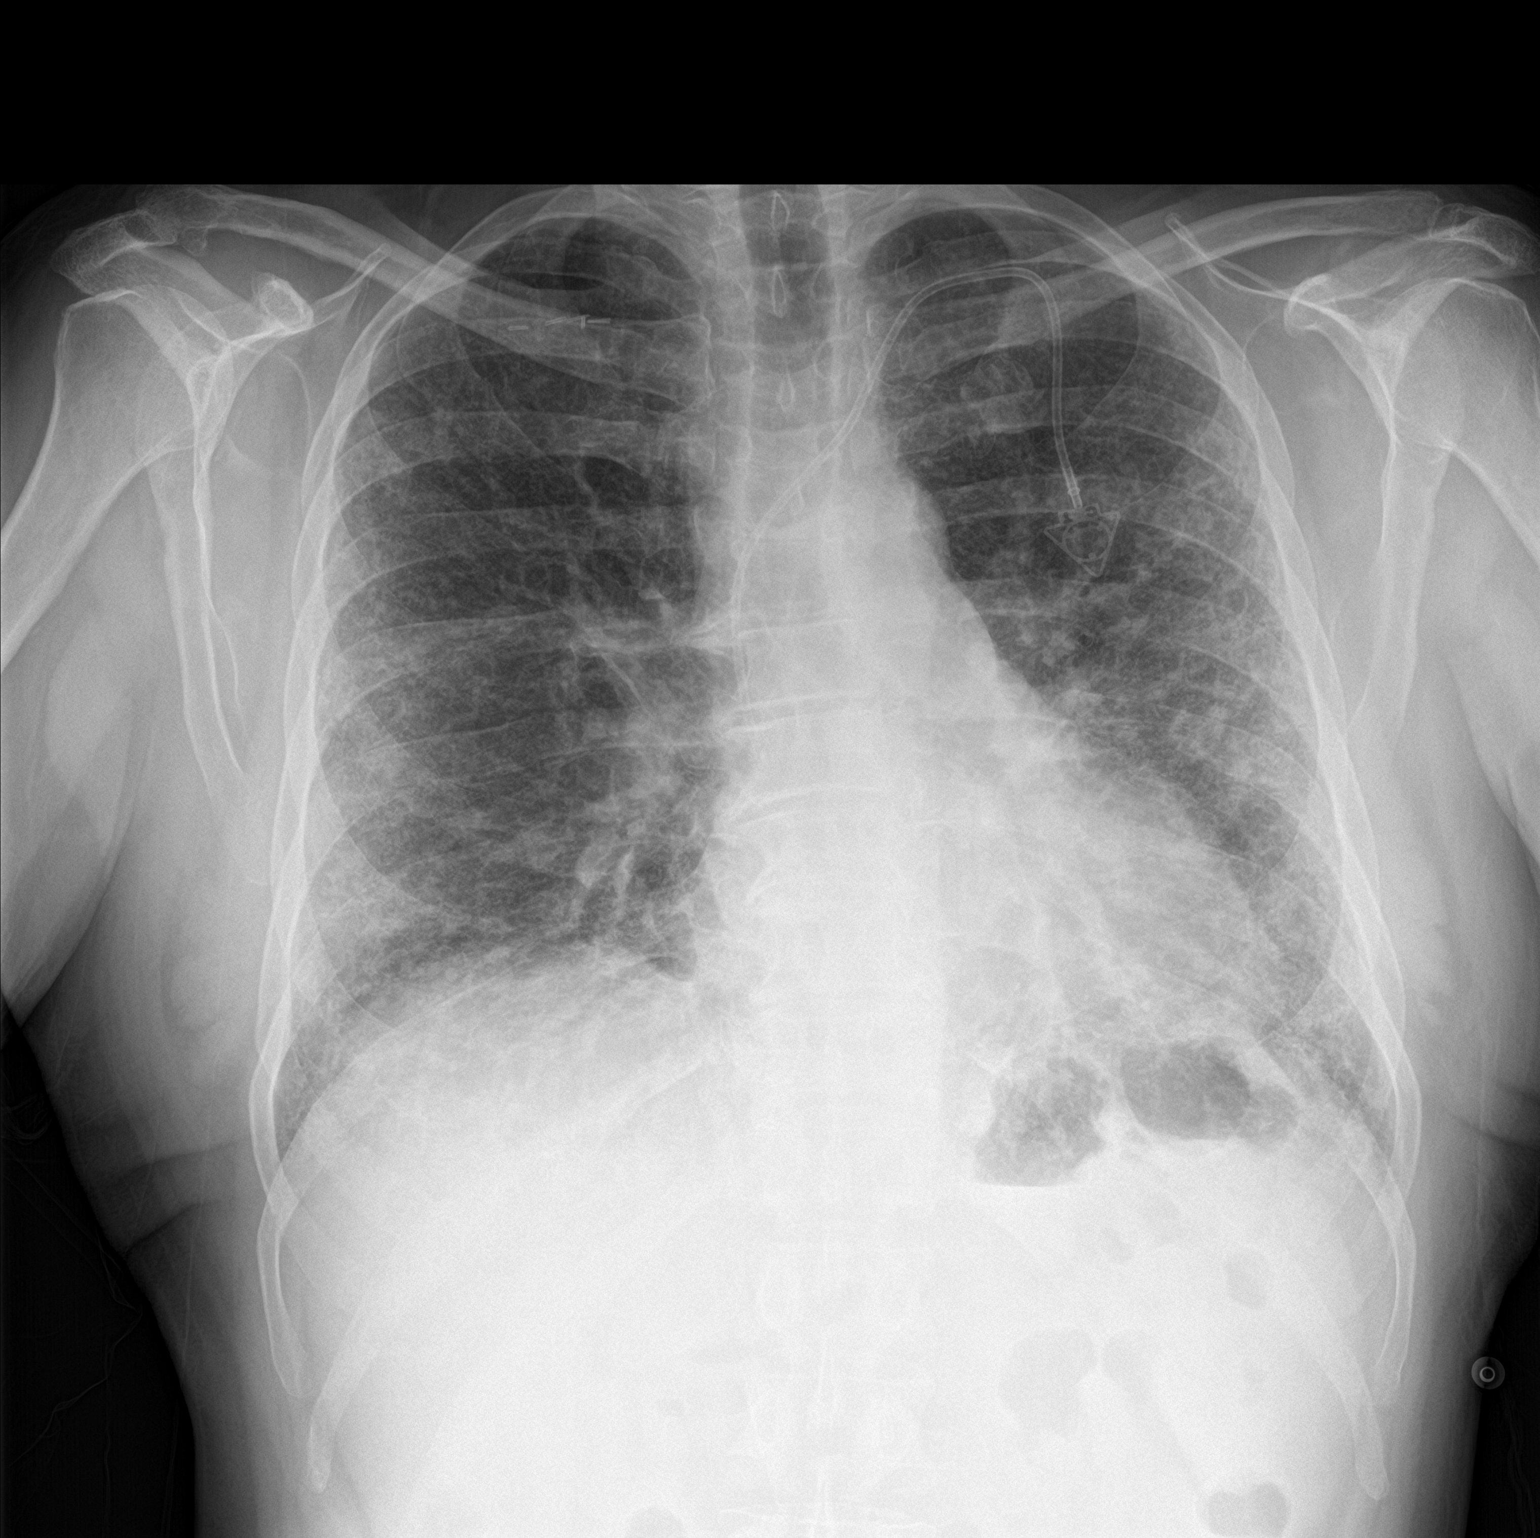

[chest lat]
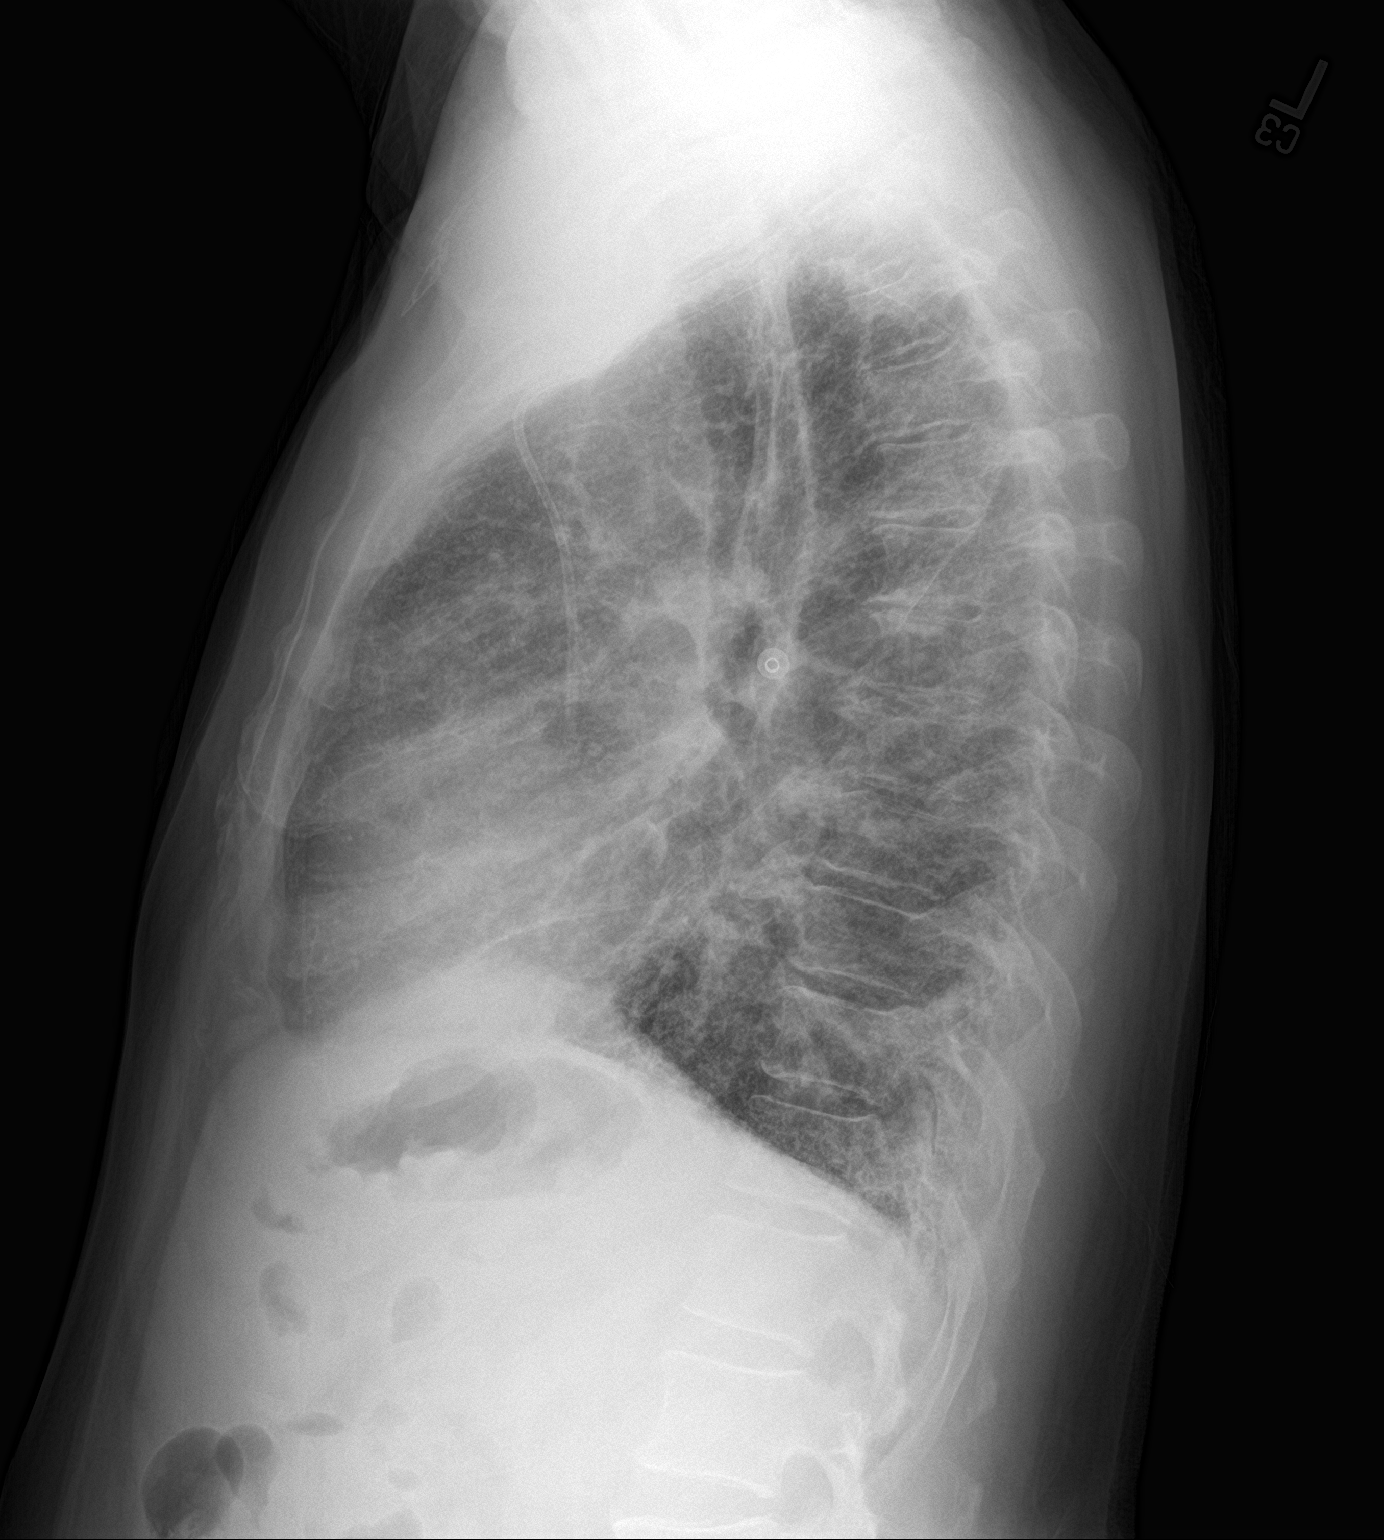

[2 of 2 positions shown; findings below may reference images not displayed]

FINDINGS: Power port in good position unchanged. There are persistent
peripheral bilateral pulmonary infiltrates, slightly accentuated on
the right and unchanged on the left. Heart size and pulmonary
vascularity are normal. No effusions. No bone abnormality.
IMPRESSION: Persistent bilateral pulmonary infiltrates, slightly accentuated on
the right.

## 2021-12-16 IMAGING — DX DG ABDOMEN 2V
2 series · 2 of 2 positions shown · non-contrast
Comparison: Scout image for CT scan of the abdomen dated 02/28/2020

CLINICAL DATA: Abdominal cramping.

EXAM:
ABDOMEN - 2 VIEW

[abdomen erect]
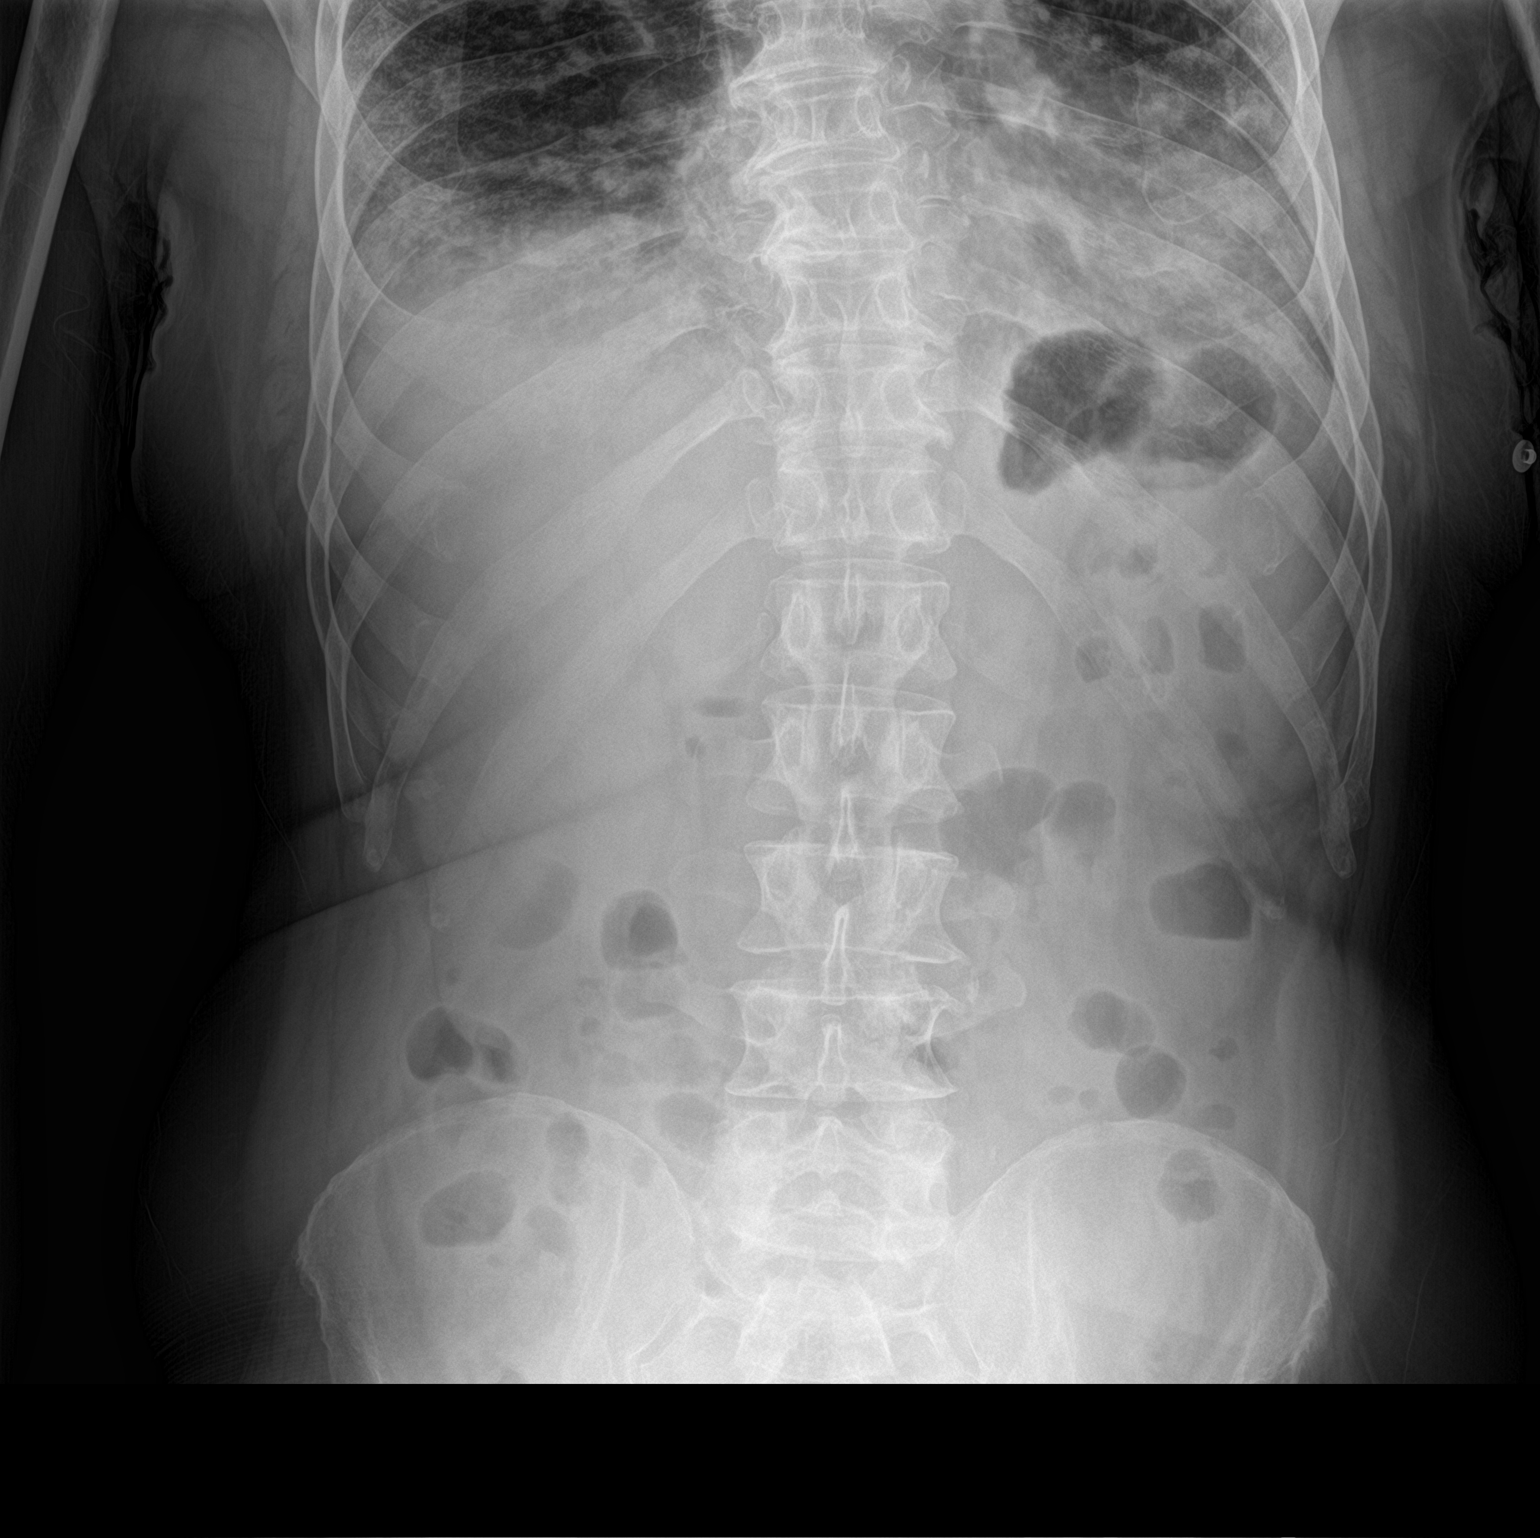

[abdomen supine]
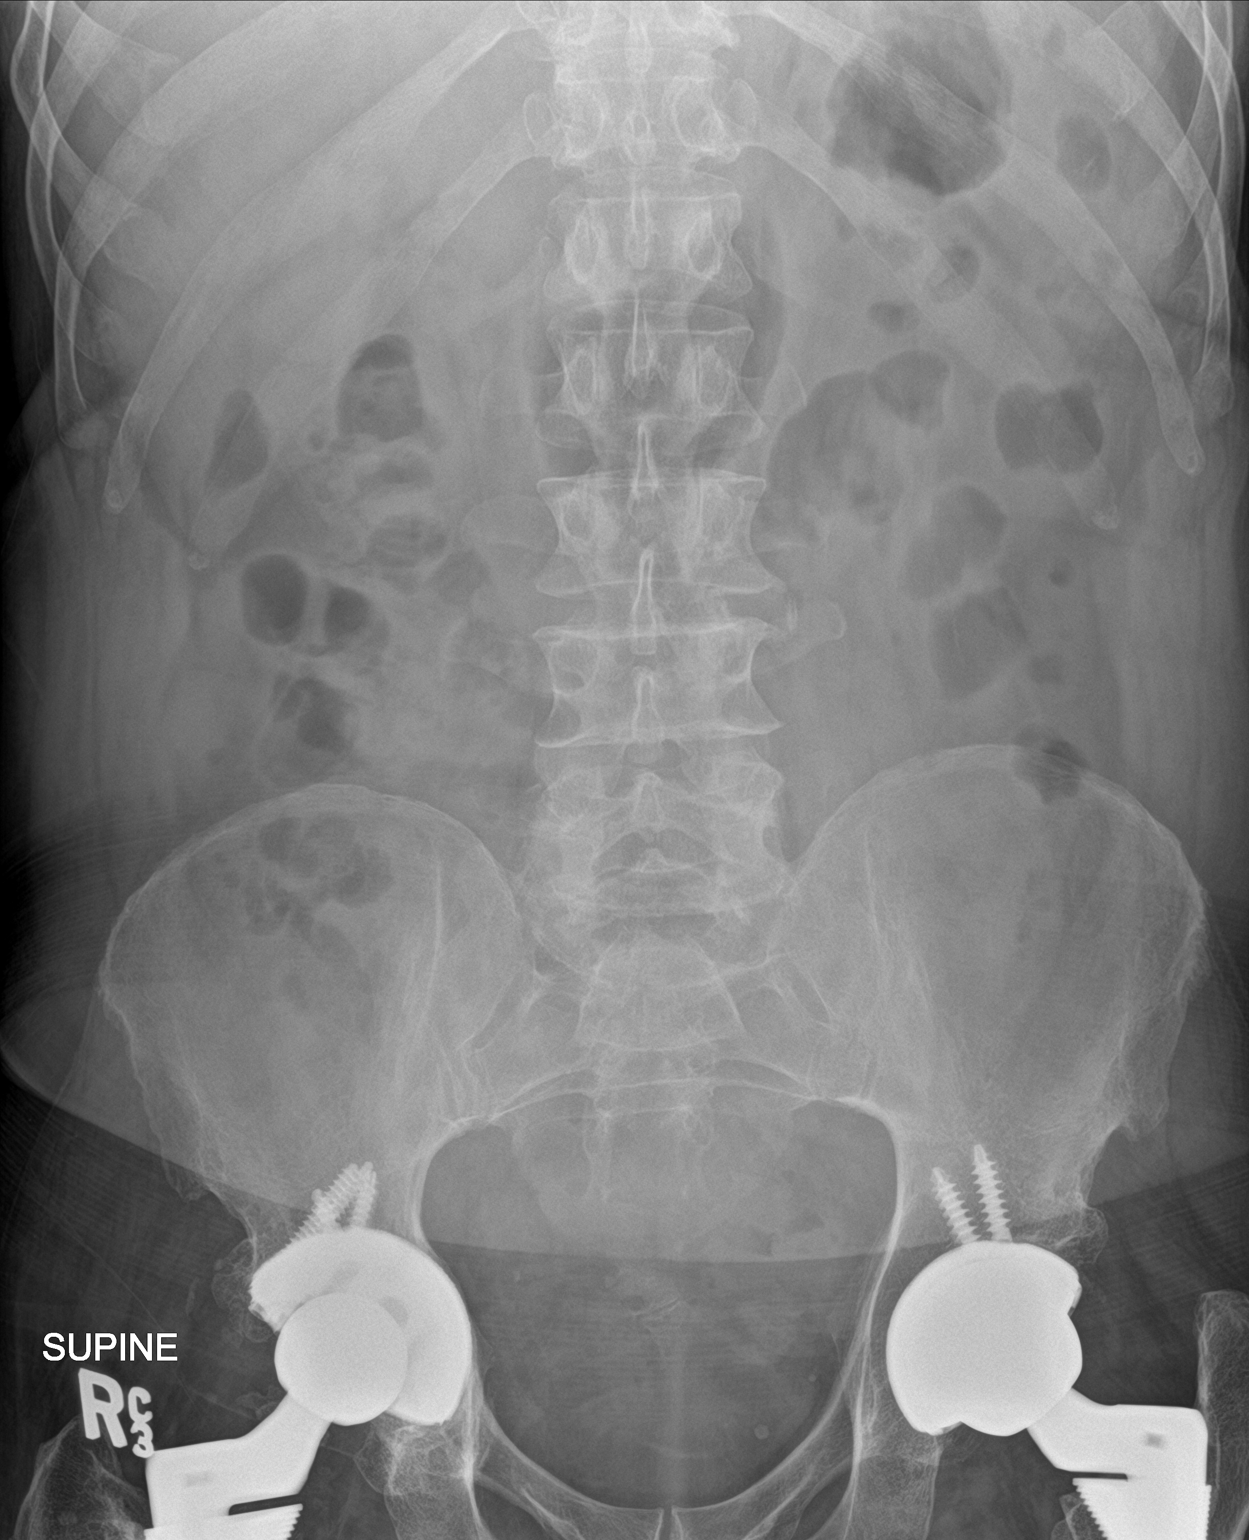

[2 of 2 positions shown; findings below may reference images not displayed]

FINDINGS: There is air scattered throughout nondistended loops of large and
small bowel with a small amount of air in the stomach. No visible
free air or free fluid. No significant acute bone abnormality.
Bilateral total hip prostheses. Bilateral pulmonary infiltrates are
visible at the lung bases.
IMPRESSION: Benign-appearing abdomen. Bilateral pulmonary infiltrates.

## 2021-12-23 IMAGING — DX DG CHEST 1V PORT
1 series · 1 of 1 positions shown · non-contrast
Comparison: July 08, 2019 chest radiograph and PET-CT June 14, 2019

CLINICAL DATA: Shortness of breath. GGDQS-[id number] positive. Hodgkin's
lymphoma.

EXAM:
PORTABLE CHEST 1 VIEW

[chest ap]
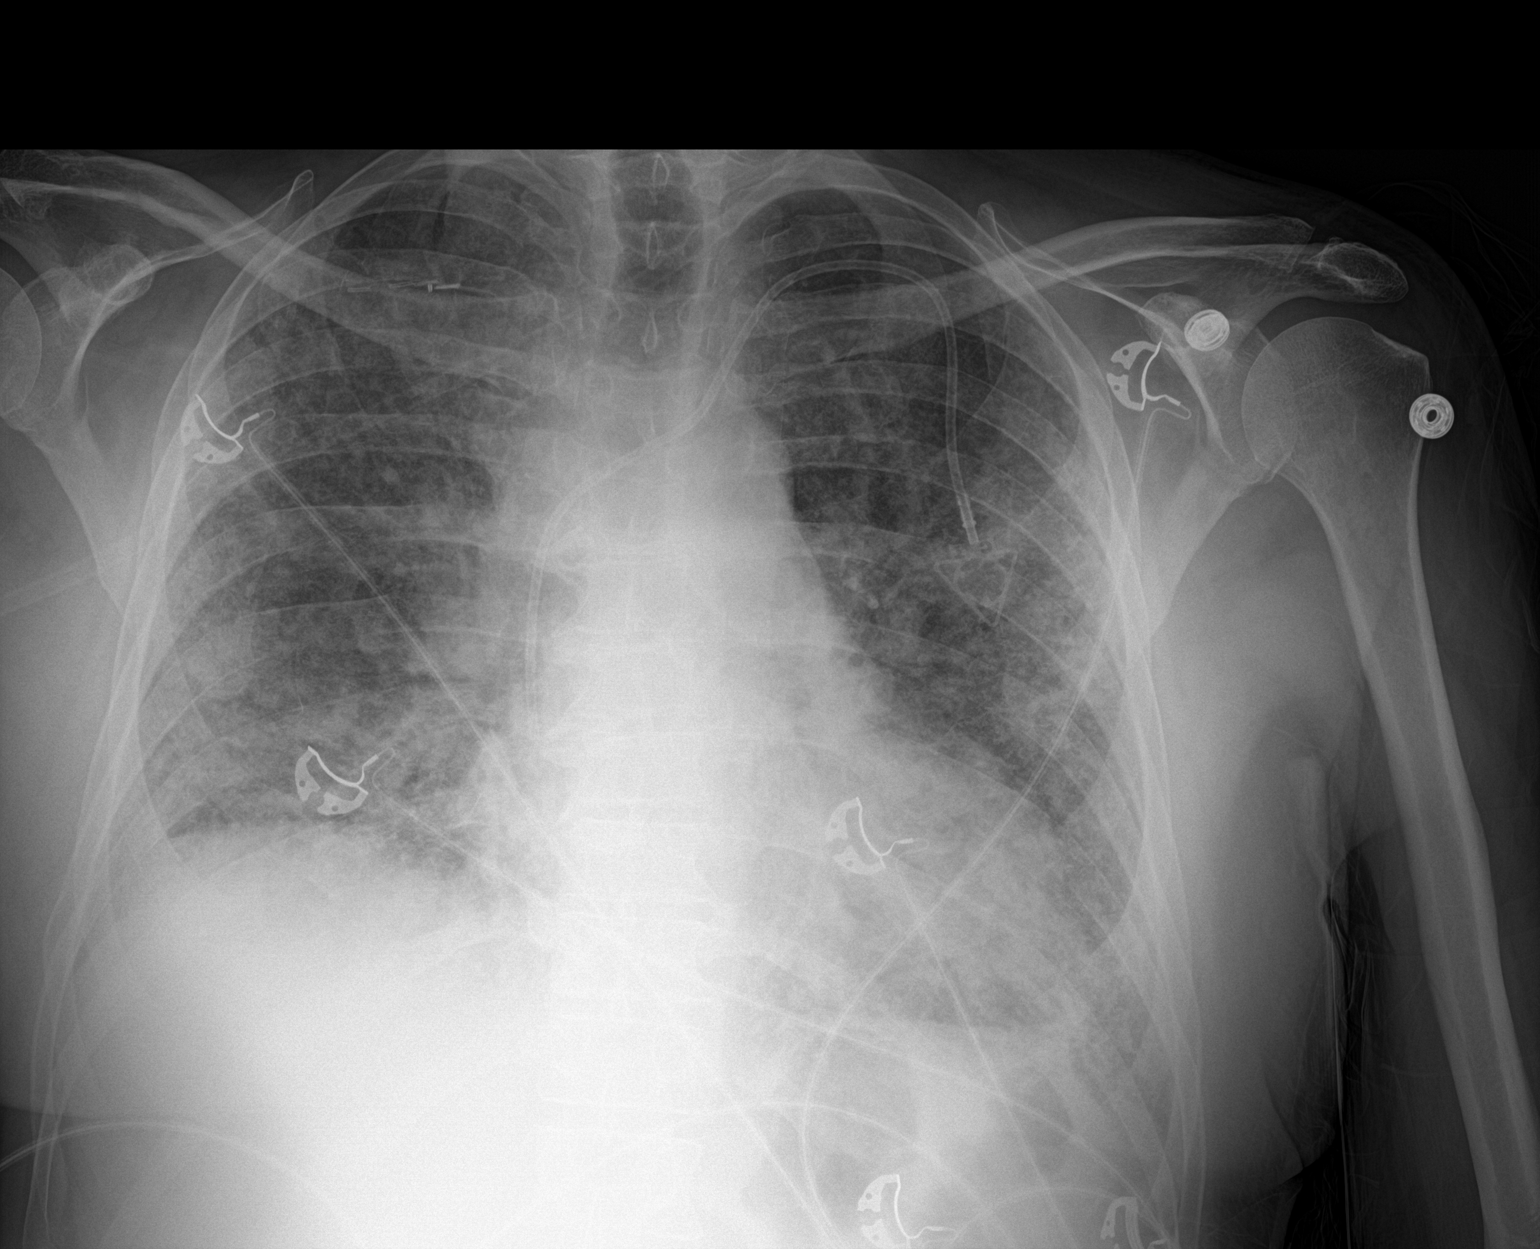

[1 of 1 positions shown; findings below may reference images not displayed]

FINDINGS: Port-A-Cath tip is in the superior vena cava near the cavoatrial
junction level. No pneumothorax. There is patchy airspace opacity
throughout the lungs bilaterally with small pleural effusions
bilaterally. Heart size and pulmonary vascularity are normal. No
adenopathy appreciable by radiography. There are surgical clips
overlying the right supraclavicular region. No bone lesions evident.
IMPRESSION: Widespread patchy airspace opacity noted bilaterally. Question
multifocal pneumonia. A degree of underlying fibrosis is apparent on
PET study. Suspect superimposed multifocal pneumonia. Small pleural
effusions bilaterally. Stable cardiac silhouette.

## 2021-12-23 IMAGING — CT CT ANGIO CHEST
2 of 6 series · 18 of 46 positions shown · IV contrast (APPLIED)
Comparison: Chest x-ray from earlier in the same day and prior
PET-CT from 06/14/2019

CLINICAL DATA: Hypoxemia, history of HHHV5-1B positivity 2 months
ago

EXAM:
CT ANGIOGRAPHY CHEST WITH CONTRAST
TECHNIQUE: Multidetector CT imaging of the chest was performed using the
standard protocol during bolus administration of intravenous
contrast. Multiplanar CT image reconstructions and MIPs were
obtained to evaluate the vascular anatomy.
CONTRAST:  80mL OMNIPAQUE IOHEXOL 350 MG/ML SOLN

[Series 5: thins · axial · 0.73mm/px · z∈[-259,+17]mm · 16 of 304 slices shown]
[im 14/304  lung]
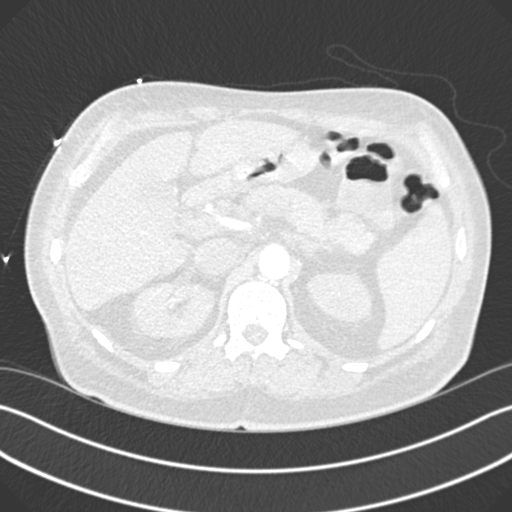
[im 40/304  soft-tissue]
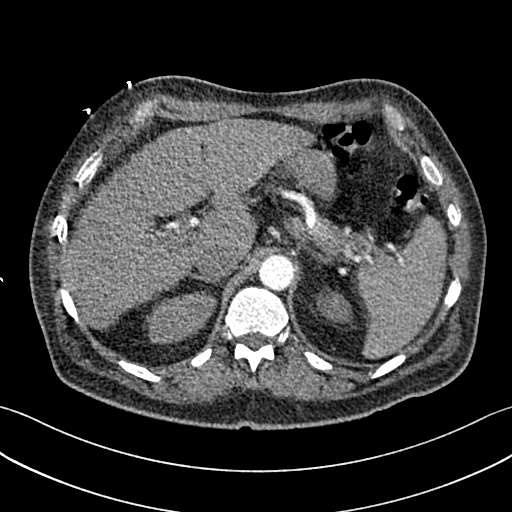
[im 53/304  lung]
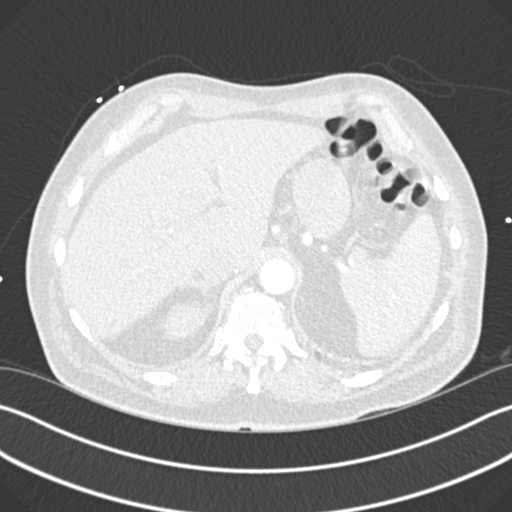
[im 66/304  soft-tissue]
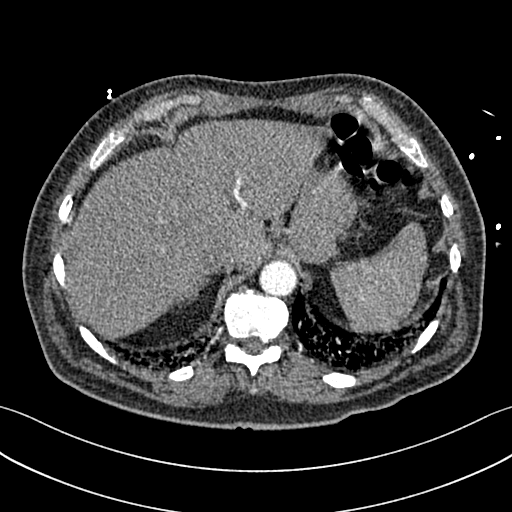
[im 93/304  lung]
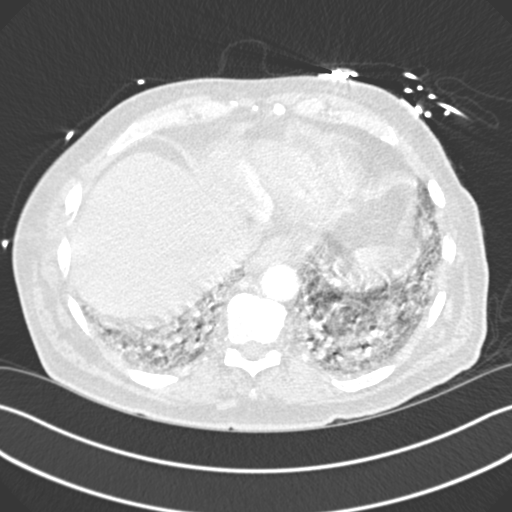
[im 106/304  soft-tissue]
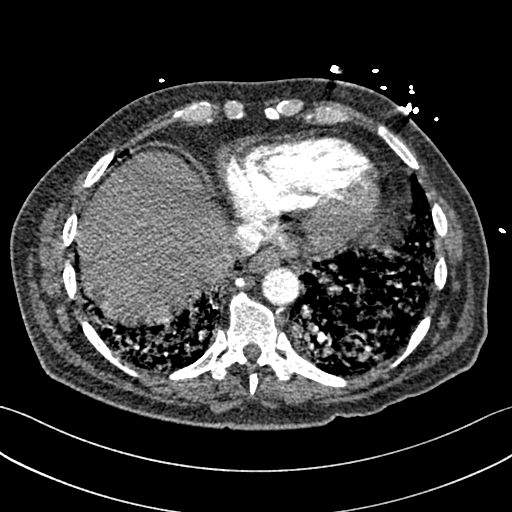
[im 119/304  lung]
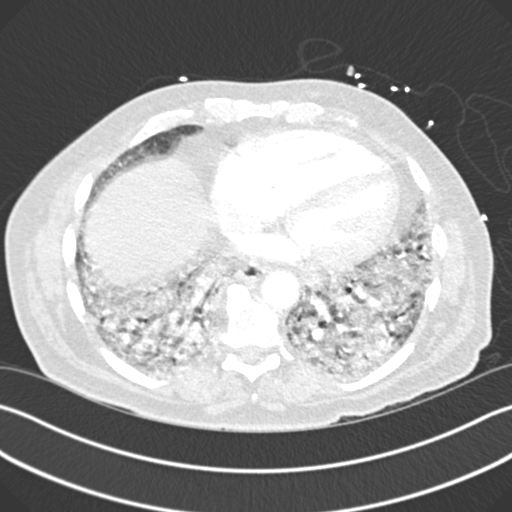
[im 145/304  soft-tissue]
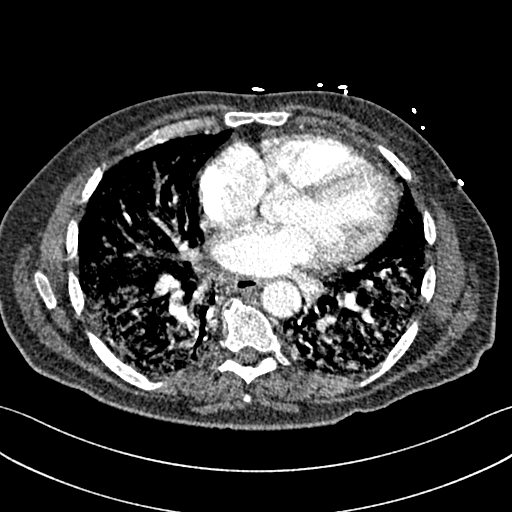
[im 159/304  lung]
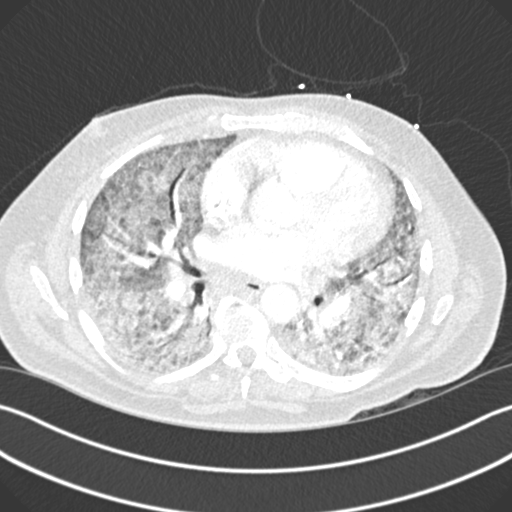
[im 185/304  soft-tissue]
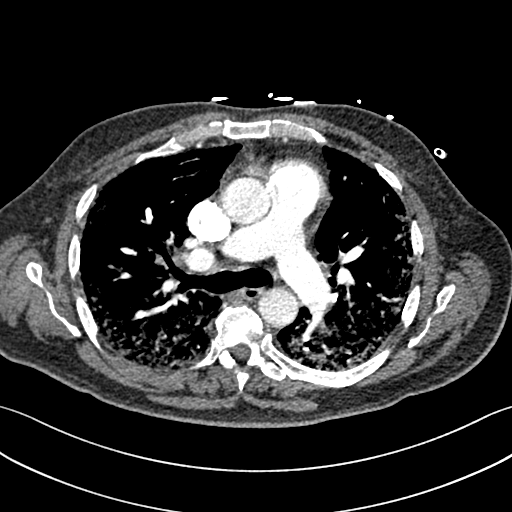
[im 198/304  lung]
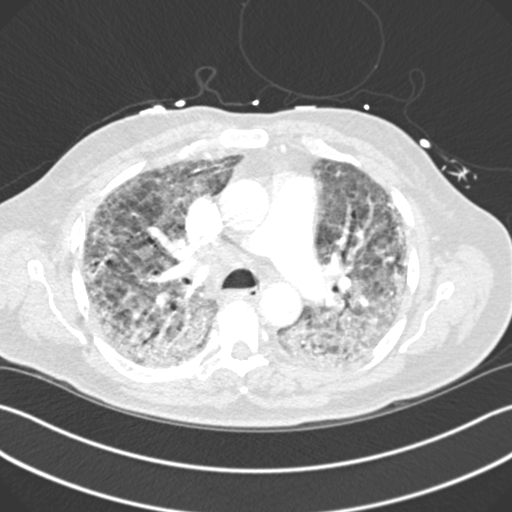
[im 211/304  soft-tissue]
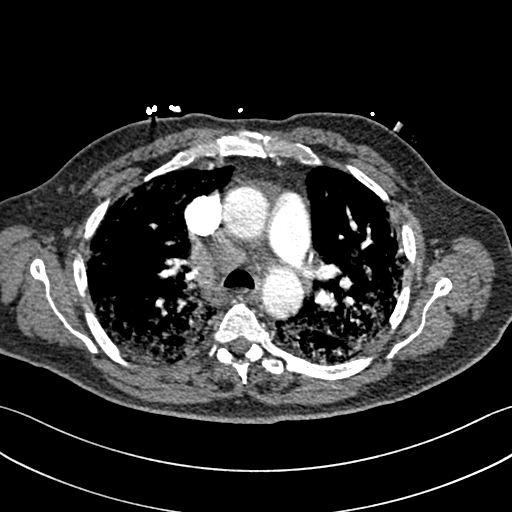
[im 238/304  lung]
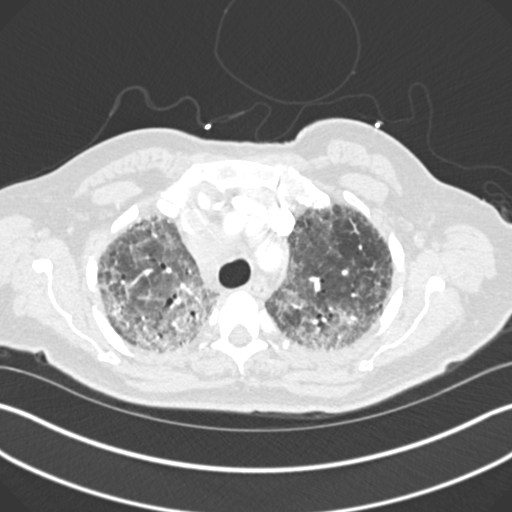
[im 251/304  soft-tissue]
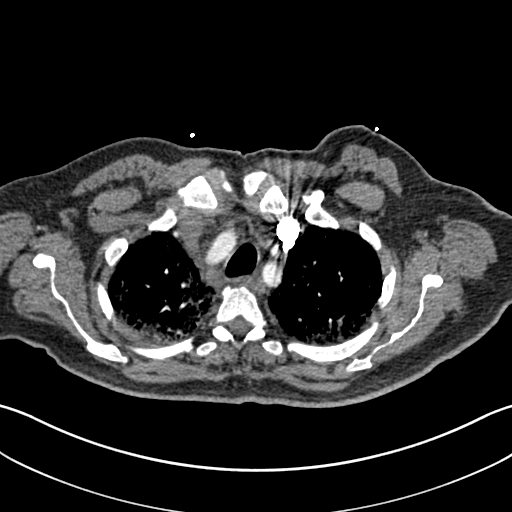
[im 264/304  lung]
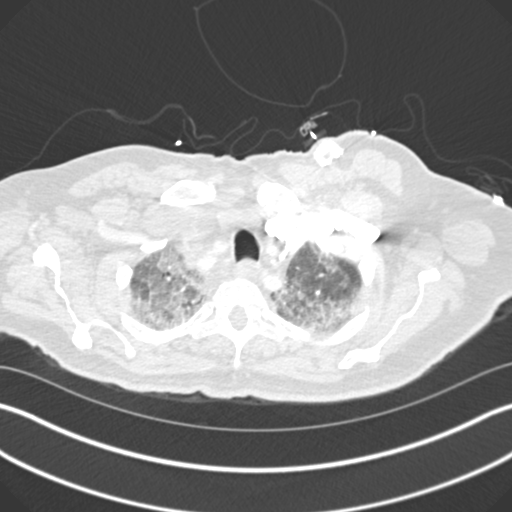
[im 290/304  soft-tissue]
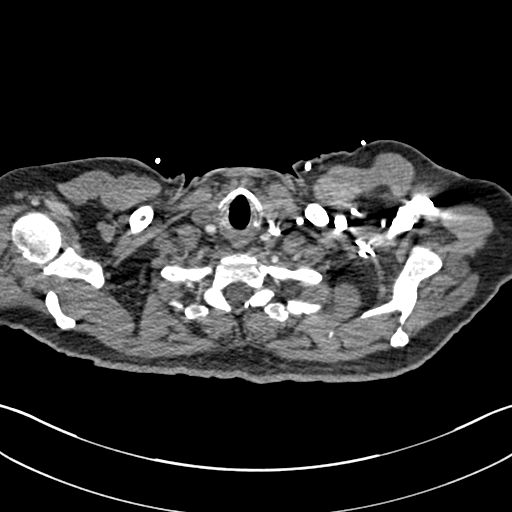

[Series 7: coronal mpr · coronal · 0.60mm/px · 2 of 96 slices shown]
[im 32/96  soft-tissue]
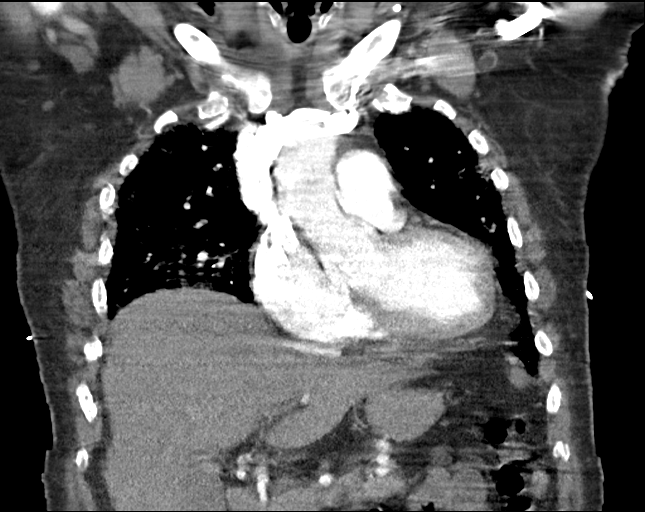
[im 64/96  soft-tissue]
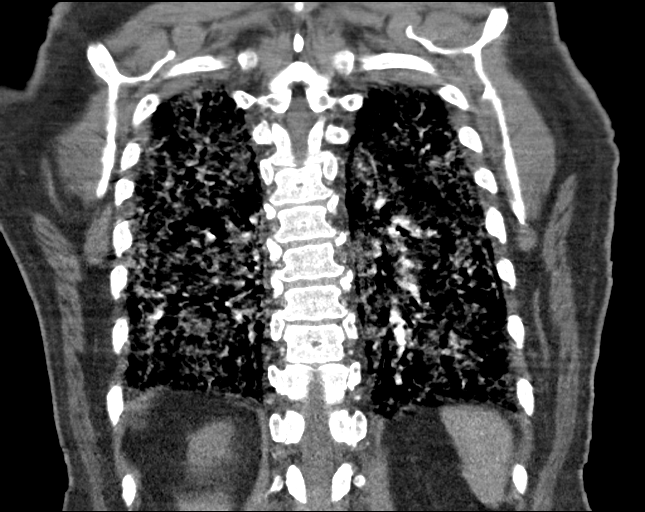

[18 of 46 positions shown; findings below may reference images not displayed]

FINDINGS: Cardiovascular: Thoracic aorta demonstrates a normal enhancement
pattern. No aneurysmal dilatation or dissection is noted. No cardiac
enlargement is seen. Pulmonary artery shows a normal branching
pattern. No definitive intraluminal filling defect is identified to
suggest pulmonary embolism. The peripheral branches are somewhat
limited due to the significant overlying parenchymal abnormality.

Mediastinum/Nodes: Thoracic inlet again shows a stable right
supraclavicular lymph node measuring approximately 12 mm in short
axis. Right pretracheal/paratracheal lymph node is again noted and
relatively stable from the prior exam consistent with the patient's
given clinical history of Hodgkin's lymphoma. Scattered smaller
mediastinal nodes are seen. Scattered hilar nodes are noted but
smaller in size.

Lungs/Pleura: Lungs again demonstrates some peripheral subpleural
fibrotic changes consistent with the known history of prior HHHV5-1B
positivity. These have been stable for several exams. There is
however new superimposed ground-glass opacity throughout both lungs
most consistent with pulmonary edema given its rapid onset. No
sizable effusion is seen. No definitive parenchymal nodules are
noted at this time although evaluation is somewhat limited.

Upper Abdomen: Visualized upper abdomen appears within normal
limits.

Musculoskeletal: Degenerative changes of the thoracic spine are
noted. No acute rib abnormality is seen.

Review of the MIP images confirms the above findings.
IMPRESSION: Diffuse widespread pulmonary opacity which is acute in nature
superimposed over more chronic subpleural fibrotic changes that were
noted on prior CT. These changes most likely represent acute
pulmonary edema given the rapid onset.

Stable right supraclavicular and mediastinal lymph nodes similar to
that seen on prior CT examination.

No other vascular abnormality is noted.

## 2021-12-26 IMAGING — DX DG CHEST 1V PORT
1 series · 1 of 1 positions shown · non-contrast
Comparison: CTA chest and chest x-ray dated July 08, 2019.

CLINICAL DATA: Continued hypoxia post COVID infection.

EXAM:
PORTABLE CHEST 1 VIEW

[chest ap]
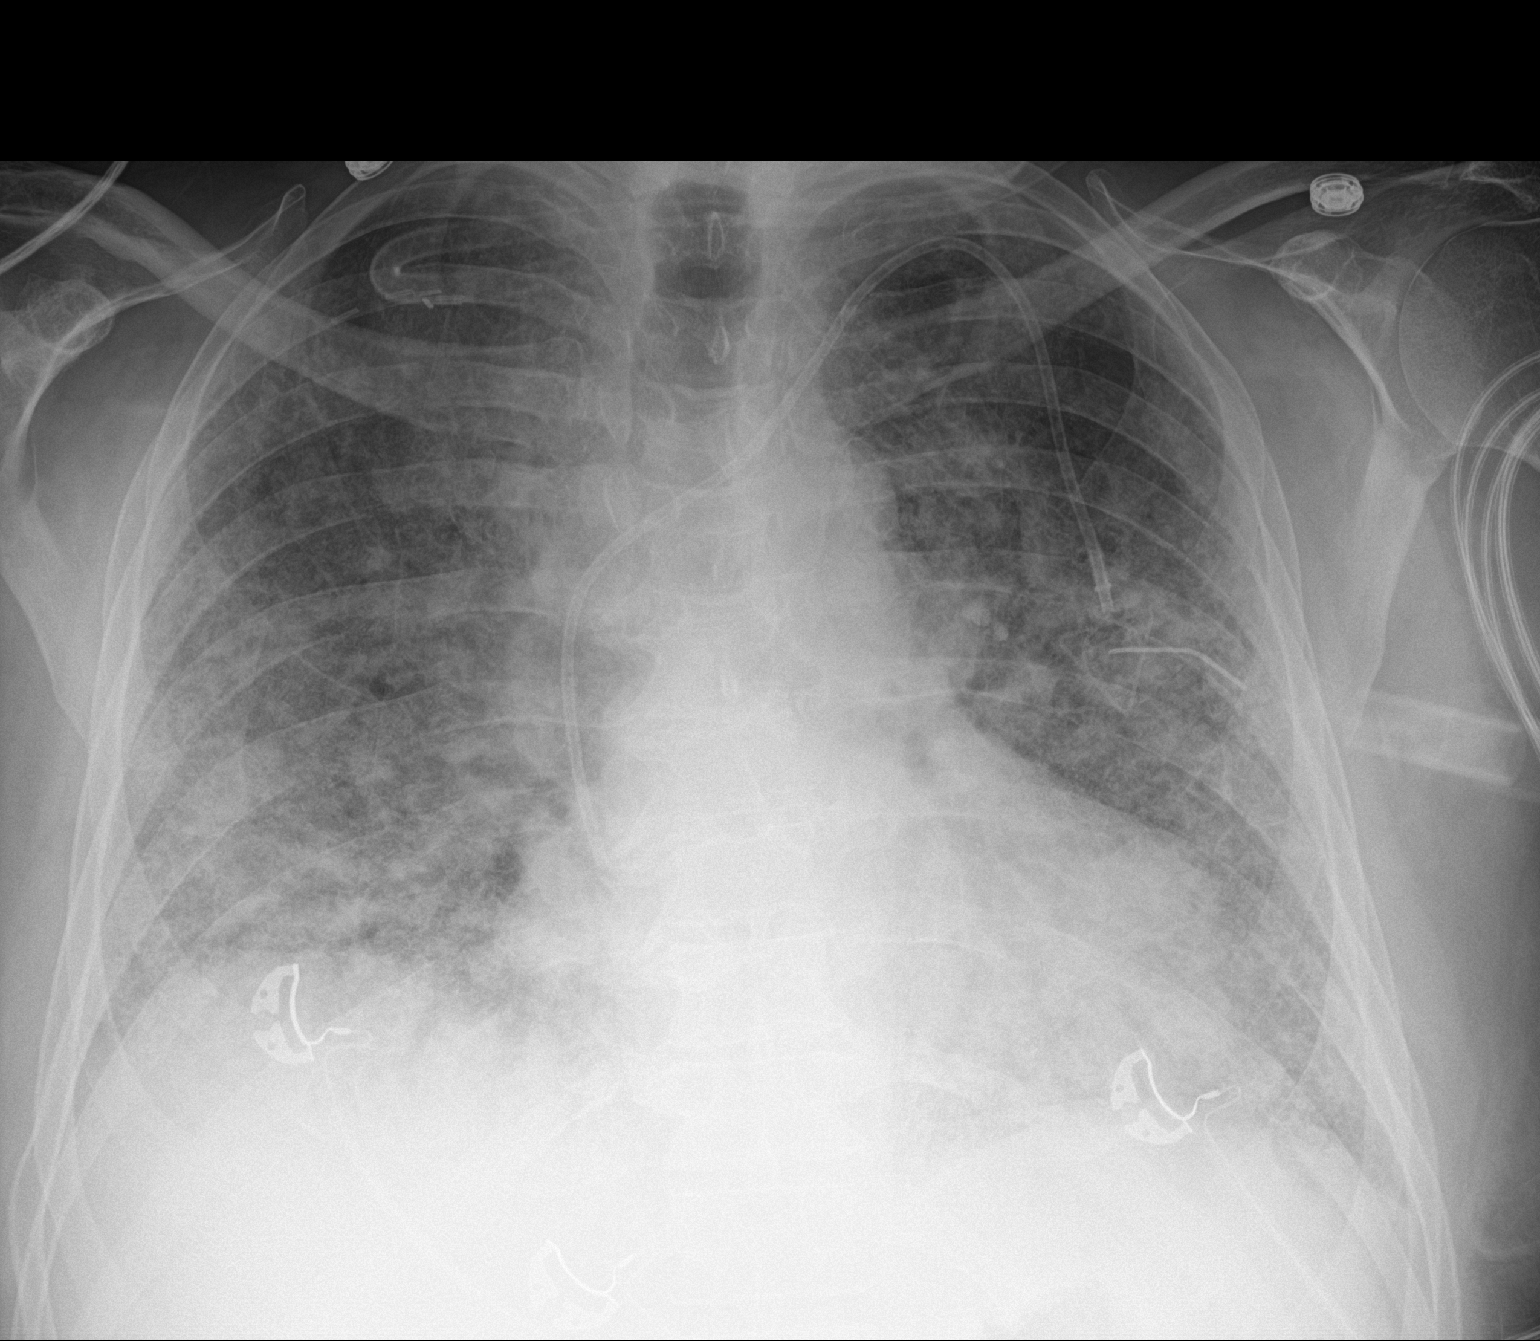

[1 of 1 positions shown; findings below may reference images not displayed]

FINDINGS: Unchanged left chest wall port catheter. Stable mild cardiomegaly.
Diffuse bilateral interstitial and hazy airspace opacities
throughout both lungs appear relatively similar to the prior study.
No pleural effusion or pneumothorax. No acute osseous abnormality.
IMPRESSION: Relatively unchanged extensive bilateral airspace disease.

## 2021-12-31 IMAGING — DX DG CHEST 1V PORT
1 series · 1 of 1 positions shown · non-contrast
Comparison: Five days ago

CLINICAL DATA: Respiratory failure

EXAM:
PORTABLE CHEST 1 VIEW

[chest ap]
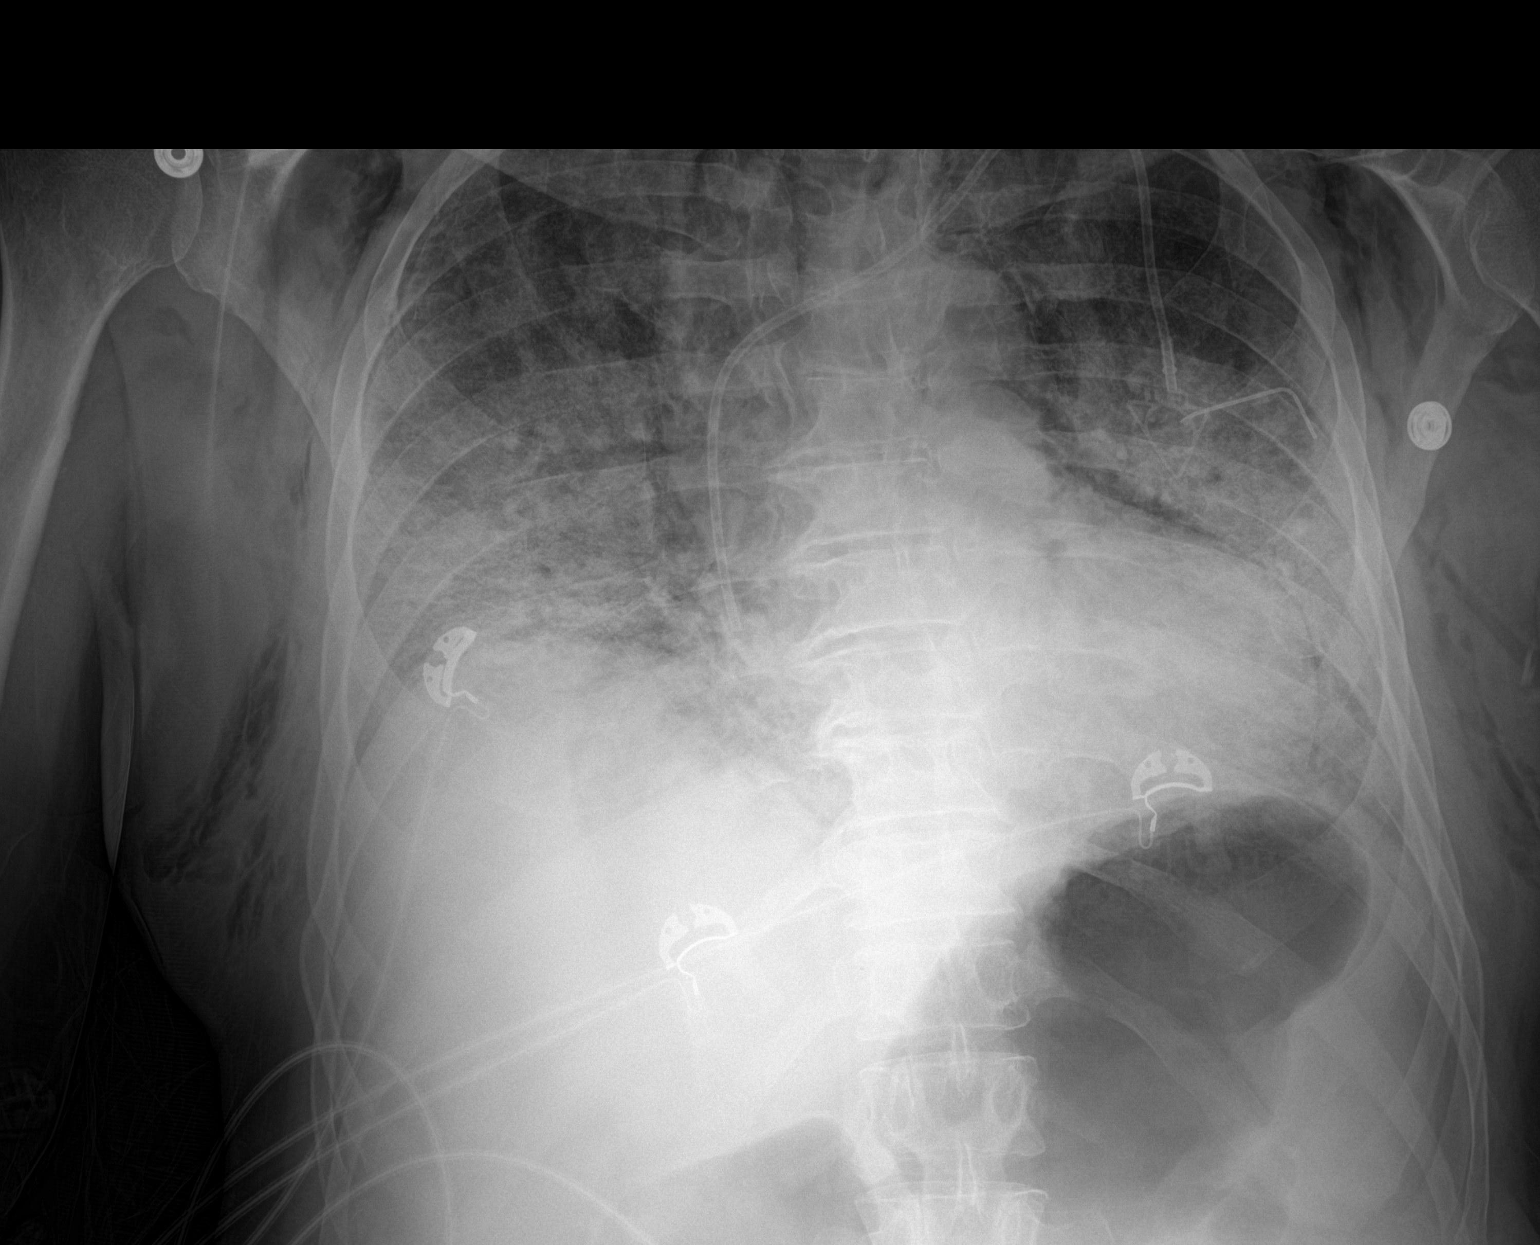

[1 of 1 positions shown; findings below may reference images not displayed]

FINDINGS: New pneumomediastinum and chest wall emphysema that is moderately
extensive. No convincing pneumothorax, paramediastinal left apical
fine line is likely related to the pneumomediastinum. Borderline
cardiomegaly. Extensive bilateral airspace disease with low lung
volumes. Port with tip at the upper cavoatrial junction. The stomach
is moderately distended.

These results will be called to the ordering clinician or
representative by the Radiologist Assistant, and communication
documented in the PACS or [REDACTED].
IMPRESSION: 1. New pneumomediastinum and bilateral chest wall emphysema. No
definite pneumothorax but recommend short follow-up to re-evaluate
the left apex.
2. Low volume chest with confluent airspace disease.
3. Moderate gaseous distension of the stomach.

## 2022-01-02 IMAGING — DX DG CHEST 1V PORT
1 series · 1 of 1 positions shown · non-contrast
Comparison: 07/16/2019

CLINICAL DATA: Hypoxia

EXAM:
PORTABLE CHEST 1 VIEW

[chest ap]
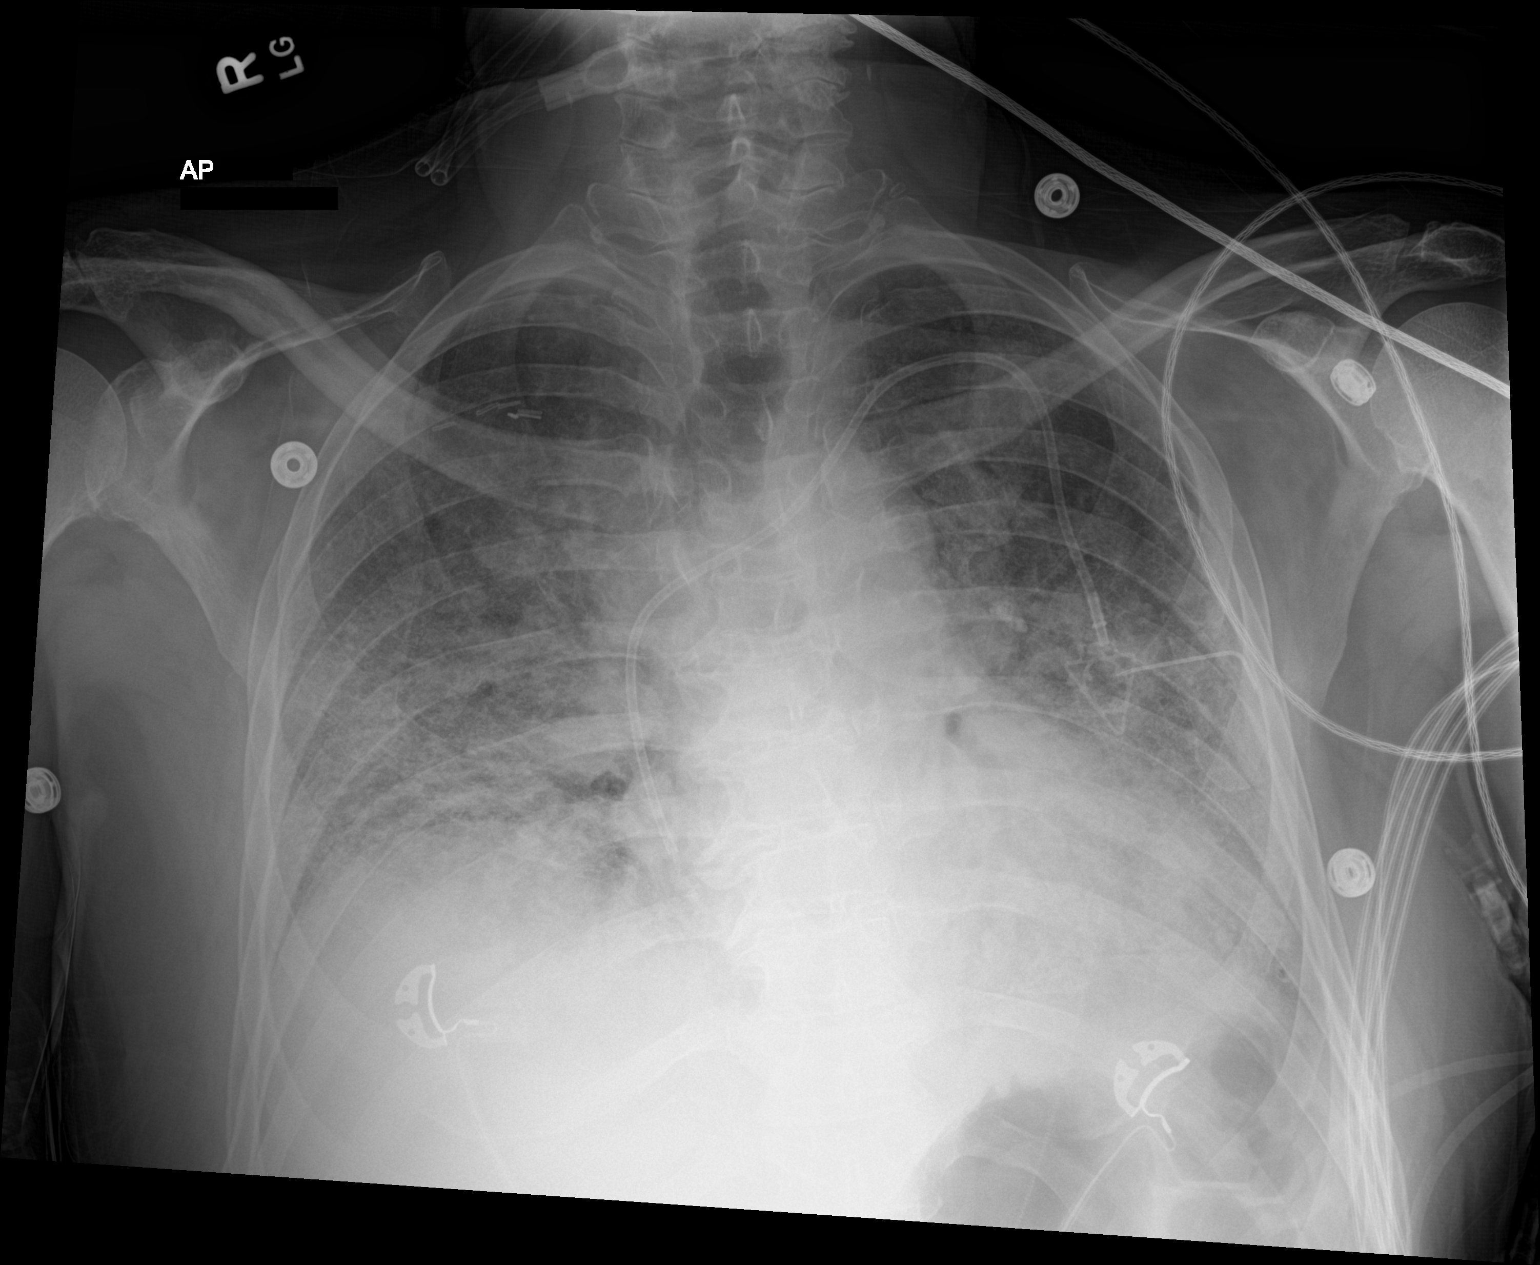

[1 of 1 positions shown; findings below may reference images not displayed]

FINDINGS: The previously demonstrated pneumomediastinum has improved. The
previously demonstrated subcutaneous gas has improved. The
left-sided Port-A-Cath is stable in positioning. Bilateral airspace
opacities are again noted. There is no definite pneumothorax. There
may be bilateral pleural effusions. The heart size is stable.
IMPRESSION: 1. Interval improvement in the previously demonstrated
pneumomediastinum and subcutaneous emphysema.
2. No convincing pneumothorax.
3. Otherwise, no significant interval change.

## 2022-01-02 IMAGING — DX DG ABDOMEN 1V
1 series · 1 of 1 positions shown · non-contrast
Comparison: 08/31/2019

CLINICAL DATA: OG tube position

EXAM:
ABDOMEN - 1 VIEW

[abdomen kub]
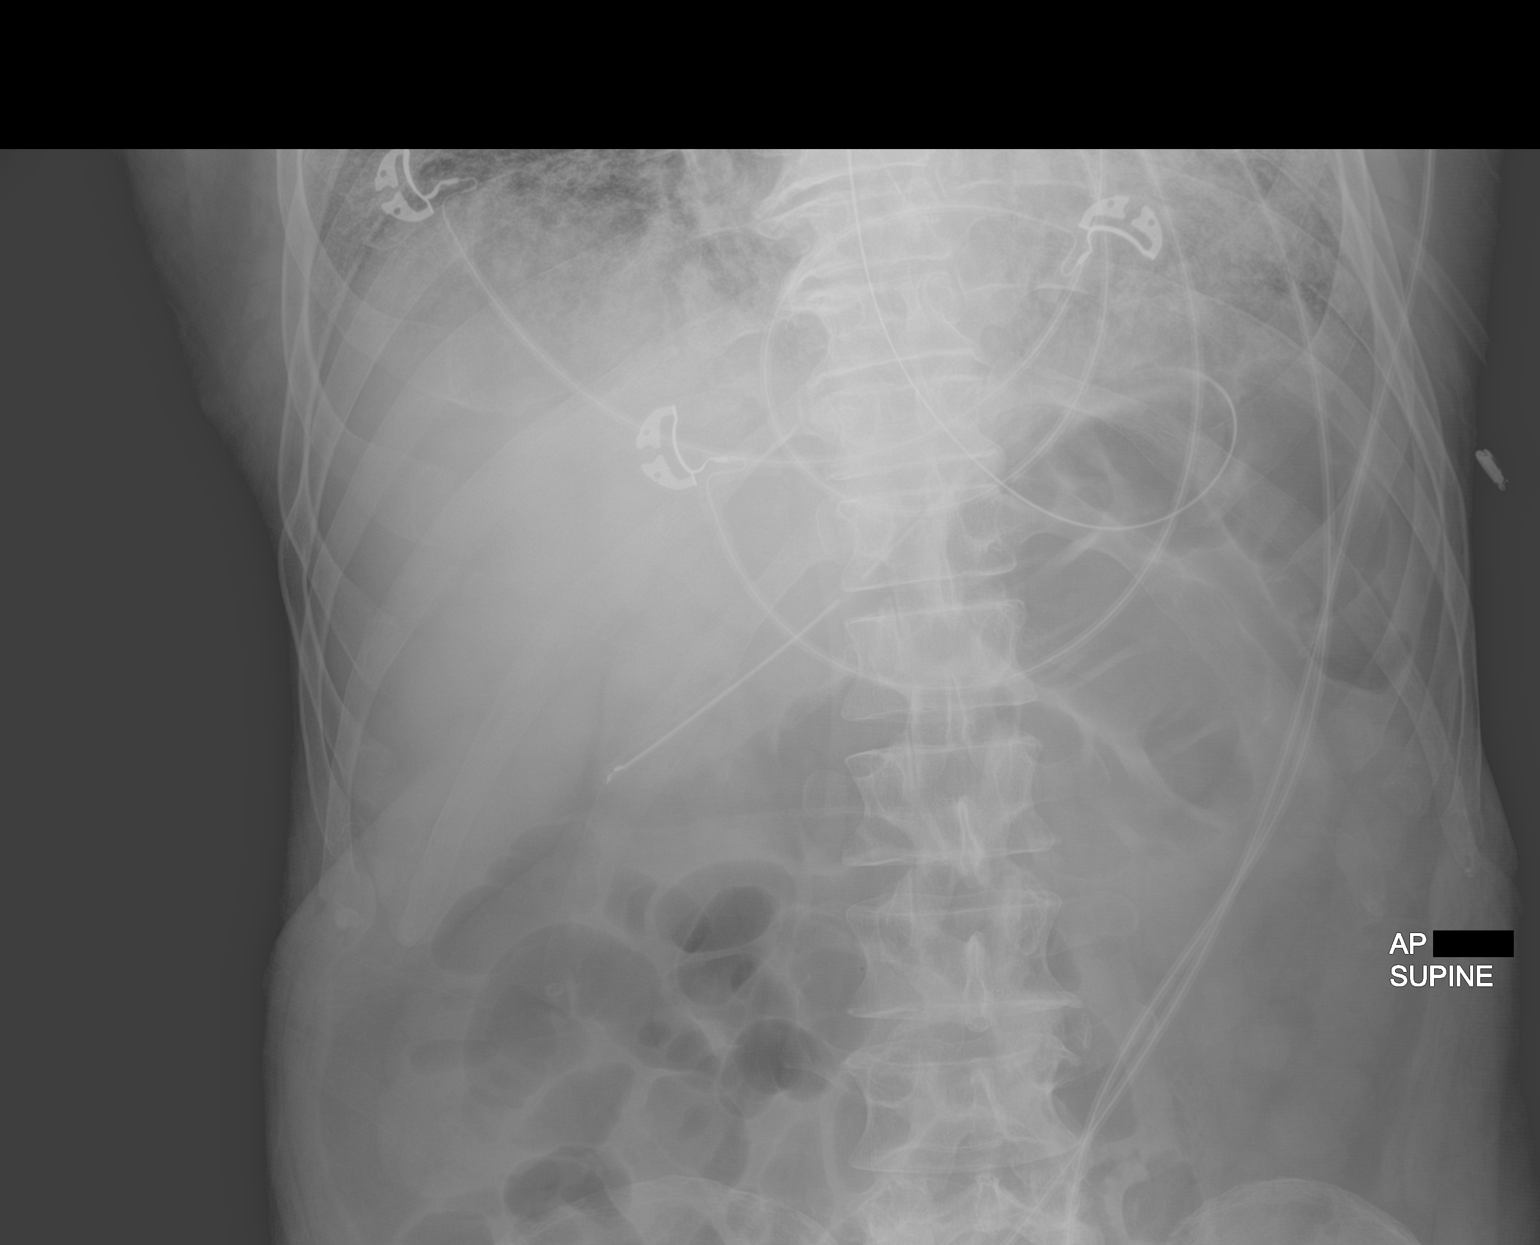

[1 of 1 positions shown; findings below may reference images not displayed]

FINDINGS: Coarse interstitial markings are noted at the lung bases. A central
venous catheter terminates in the right atrium.

Interval placement of a gastric tube, tip in the antrum or pylorus.

Increasing small bowel distension since the plain film evaluation
from July 01, 2019.

Visualized skeletal structures are unremarkable.
IMPRESSION: 1. Interval placement of a gastric tube, tip in the antrum or
pylorus.
2. Increasing small bowel distension since the plain film evaluation
from July 01, 2019. Findings may represent ileus or developing
obstruction. Follow-up radiography may be helpful.

## 2022-01-02 IMAGING — DX DG CHEST 1V PORT
1 series · 1 of 1 positions shown · non-contrast
Comparison: Earlier film, same date.

CLINICAL DATA: Status post intubation.

EXAM:
PORTABLE CHEST 1 VIEW

[chest ap]
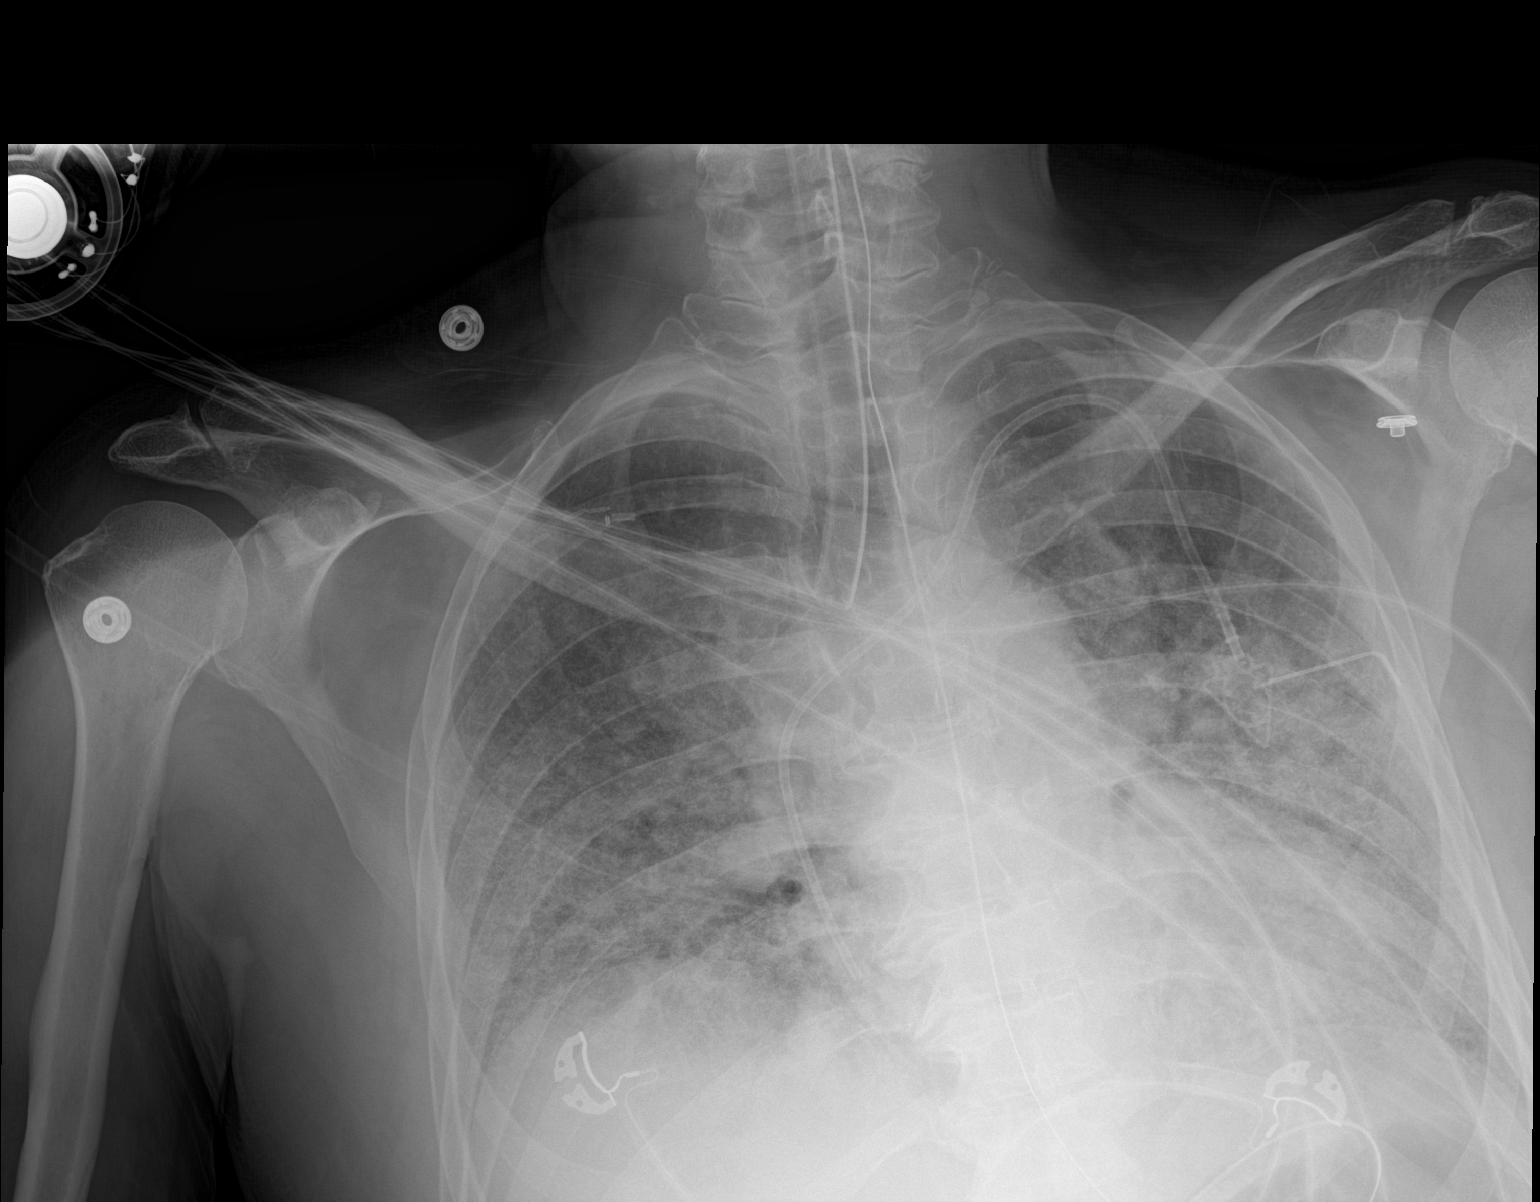

[1 of 1 positions shown; findings below may reference images not displayed]

FINDINGS: The endotracheal tube is 3.7 cm above the carina.

The NG tube is coursing down the esophagus and into the stomach.

Left IJ power port is stable.

Persistent diffuse interstitial and airspace process in the lungs.
No definite pleural effusions. No pneumothorax.
IMPRESSION: 1. The endotracheal tube is 3.7 cm above the carina.
2. Persistent diffuse interstitial and airspace process.

## 2022-01-02 IMAGING — DX DG CHEST 1V PORT
2 series · 2 of 2 positions shown · non-contrast
Comparison: Radiograph earlier today.

CLINICAL DATA: Subcutaneous air.

EXAM:
PORTABLE CHEST 1 VIEW

[chest ap (1 of 2)]
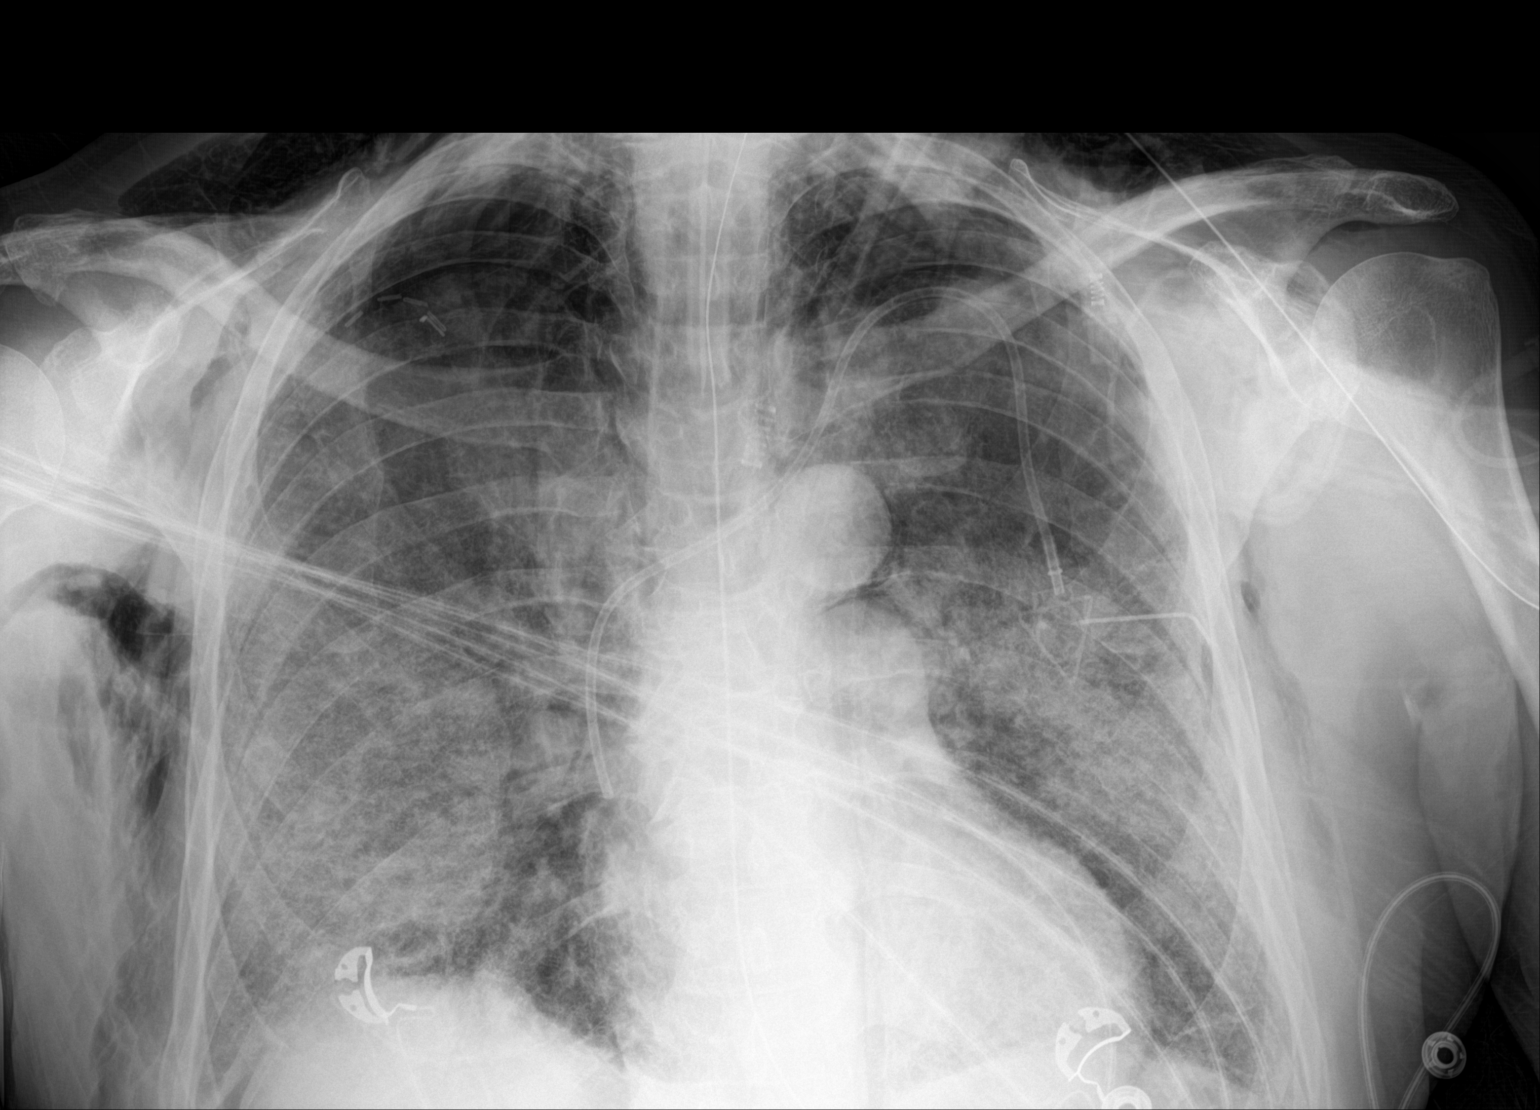

[chest ap (2 of 2)]
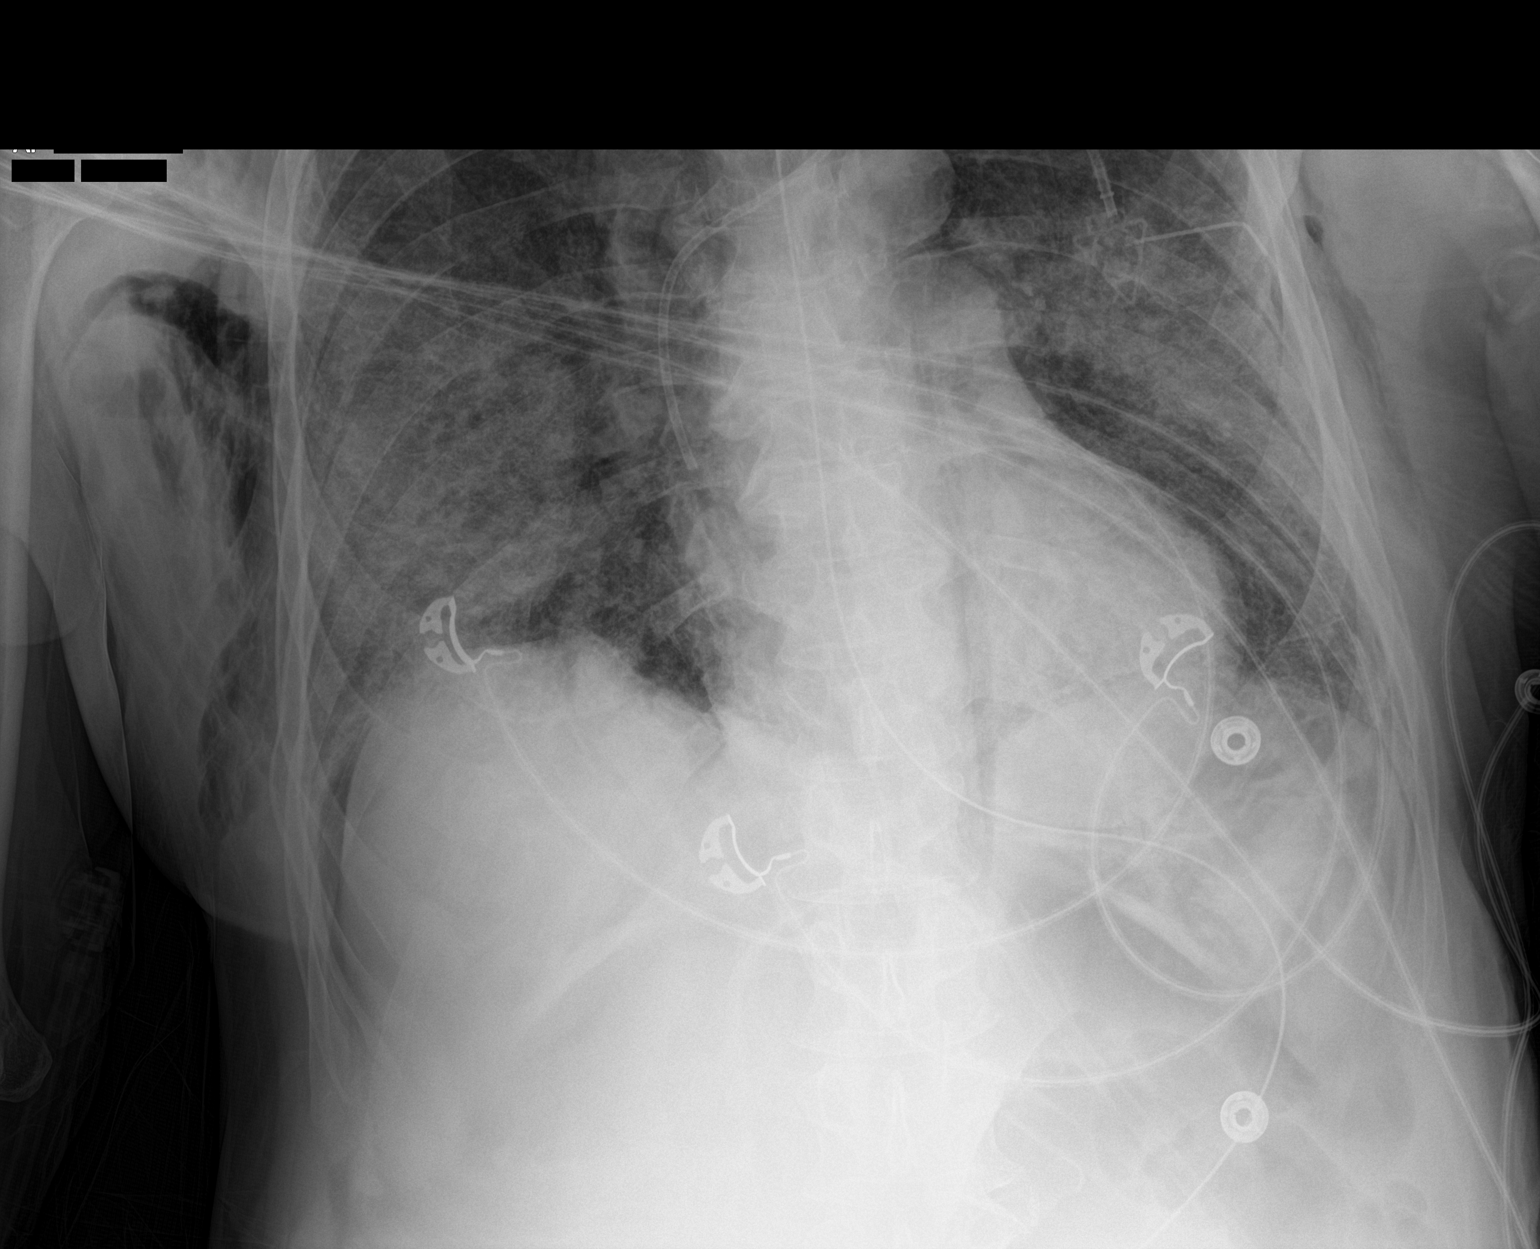

[2 of 2 positions shown; findings below may reference images not displayed]

FINDINGS: Development of extensive bilateral subcutaneous emphysema involving
the chest wall. There are bilateral pneumothoraces at the apices
each measuring approximately 3 cm from the apex. Endotracheal tube,
enteric tube, and left chest port remains in place. Unchanged heart
size and mediastinal contours. Heterogeneous bilateral lung
opacities are unchanged from earlier today.
IMPRESSION: 1. Development of extensive subcutaneous emphysema involving right
and left chest wall and supraclavicular soft tissues.
2. Bilateral pneumothoraces at the apices each measuring
approximately 3 cm from the apex.
3. Unchanged heterogeneous bilateral lung opacities from earlier
this day.

Critical Value/emergent results were discussed by telephone at the
time of interpretation on 07/18/2019 at [DATE] with Dr WETS
REUQUE , who verbally acknowledged these results.

## 2022-01-02 IMAGING — DX DG CHEST 1V PORT
1 series · 1 of 1 positions shown · non-contrast
Comparison: Radiograph 2 hours ago.

CLINICAL DATA: Bilateral chest tube placement

EXAM:
PORTABLE CHEST 1 VIEW

[chest ap]
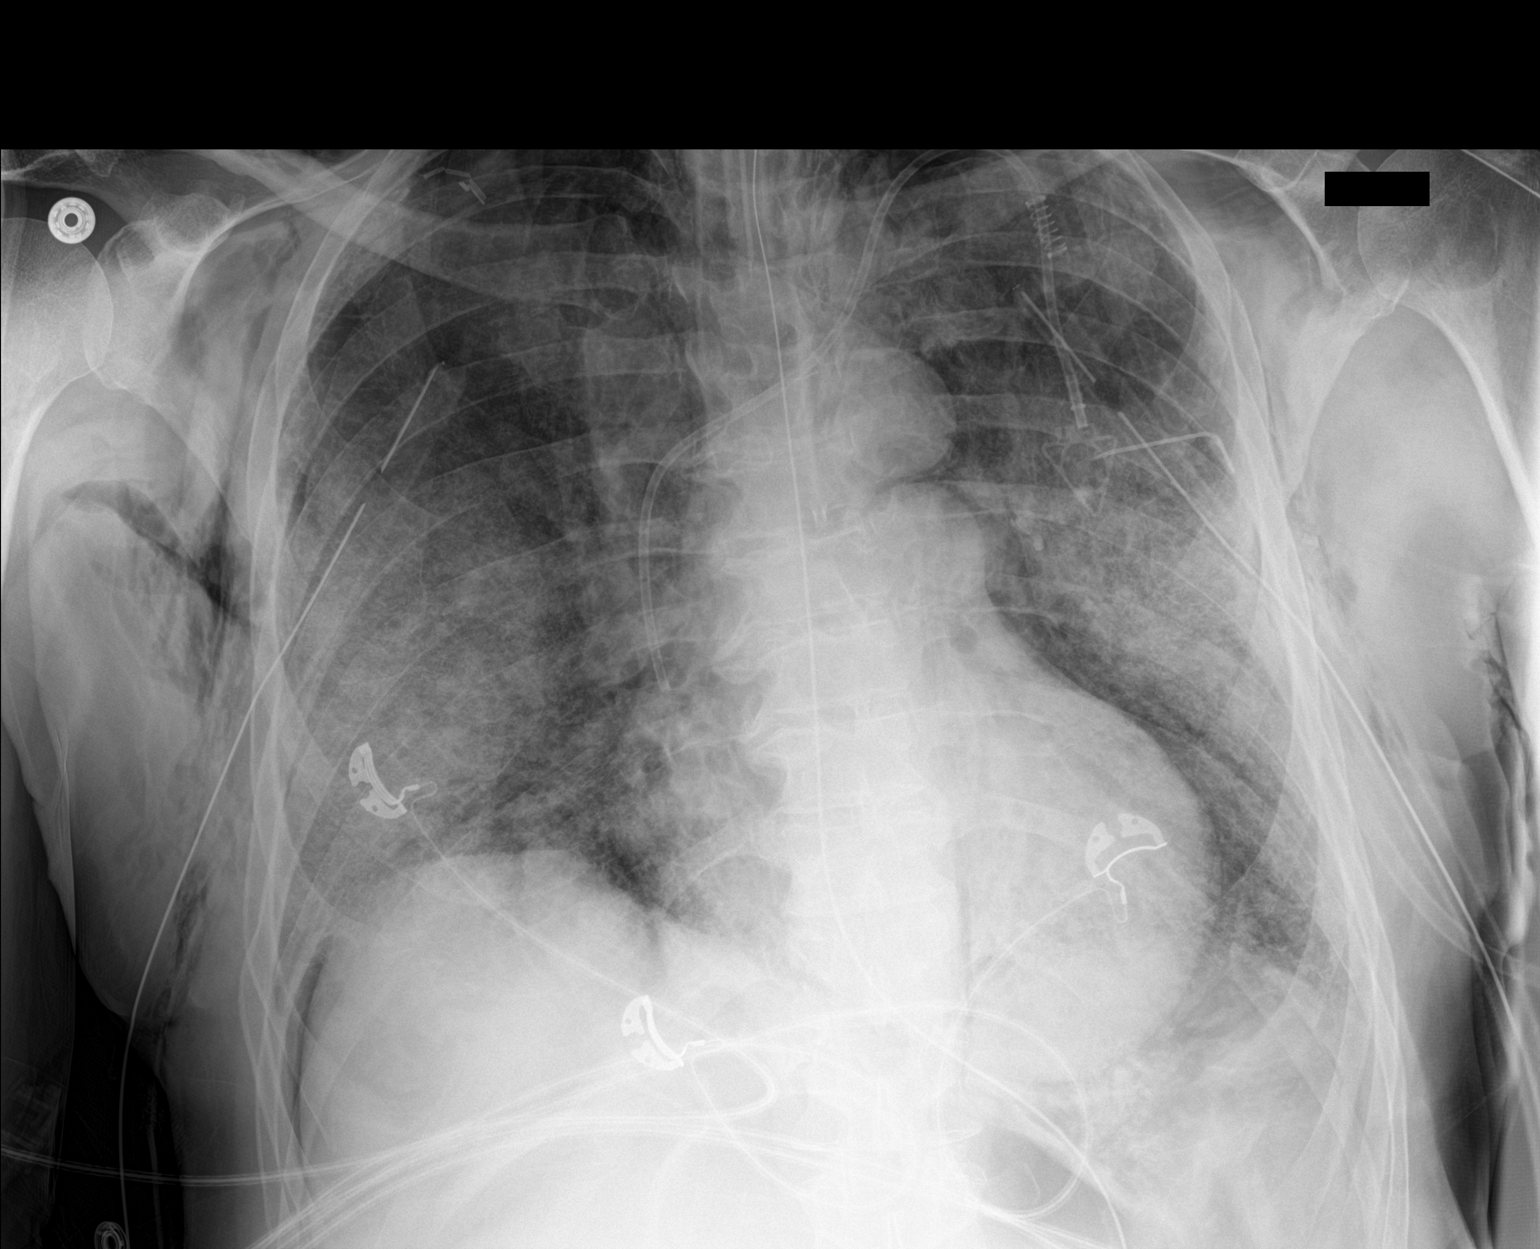

[1 of 1 positions shown; findings below may reference images not displayed]

FINDINGS: Placement of right-sided chest tube with tip in the mid lung
directed towards the apex. Moderate-sized right pneumothorax is
unchanged or minimally increased in the interim.

Placement of left-sided chest tube with tip in the mid lung directed
towards the apex. Left pneumothorax has decreased in the interim.

Extensive subcutaneous emphysema again seen in the chest wall and
supraclavicular soft tissues. Lucency abutting the left heart border
may be pneumothorax or pneumomediastinum. Heterogeneous bilateral
lung opacities are unchanged. Support apparatus unchanged.
IMPRESSION: 1. Placement of bilateral chest tubes. Right pneumothorax is
unchanged or minimally increased in the interim. Left pneumothorax
has decreased.
2. Extensive subcutaneous emphysema in the chest wall and
supraclavicular soft tissues.
3. Lucency abutting the left heart border may be pneumothorax or
pneumomediastinum.
4. Stable support apparatus and bilateral heterogeneous lung
opacities.

## 2022-01-03 IMAGING — DX DG CHEST 1V PORT
1 series · 1 of 1 positions shown · non-contrast
Comparison: July 18, 2019

CLINICAL DATA: Hypoxia

EXAM:
PORTABLE CHEST 1 VIEW

[chest ap]
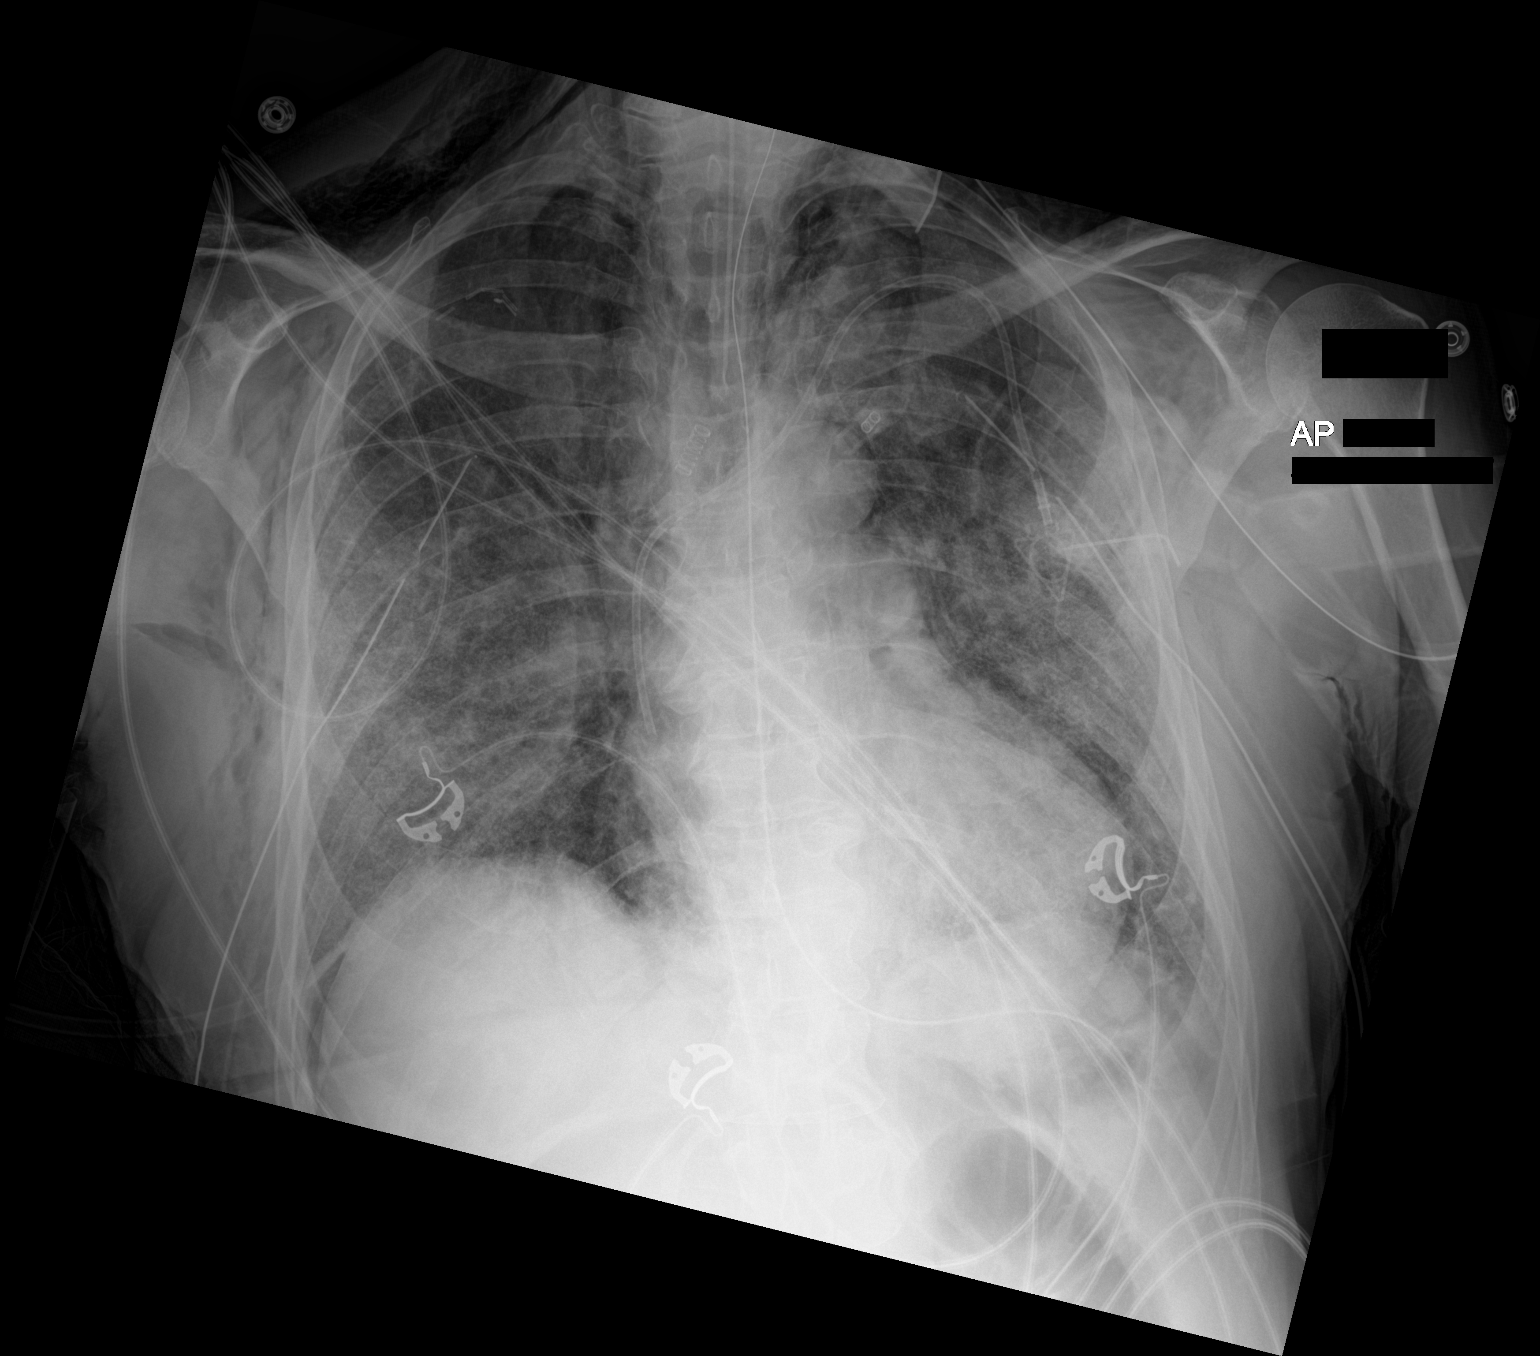

[1 of 1 positions shown; findings below may reference images not displayed]

FINDINGS: Endotracheal tube tip is 4.9 cm above the carina. Nasogastric tube
tip and side port are below the diaphragm. There is a chest tube on
each side. Port-A-Cath tip is in the superior vena cava near the
cavoatrial junction. The previously noted pneumothorax on the right
is not appreciable currently. Small left apical pneumothorax is
stable. Extensive subcutaneous air is noted bilaterally. There is
questionable pneumomediastinum as well, grossly stable.

Ill-defined airspace opacity is noted bilaterally. No new opacity
evident. Heart size and pulmonary vascularity are normal. No
adenopathy. No bone lesions.
IMPRESSION: Tube and catheter positions as described. Small pneumothorax on the
left. Previously noted pneumothorax on the right not convincingly
seen. There is extensive subcutaneous air with suspected
pneumomediastinum, similar to 1 day prior. Ill-defined opacity
bilaterally likely represents multifocal pneumonia. A degree of
superimposed pulmonary edema cannot be excluded. Cardiac silhouette
is stable.

## 2022-01-04 IMAGING — DX DG CHEST 1V PORT
1 series · 1 of 1 positions shown · non-contrast
Comparison: 07/18/2019, 07/16/2019, CT chest 07/08/2019

CLINICAL DATA: Acute respiratory failure

EXAM:
PORTABLE CHEST 1 VIEW

[chest ap]
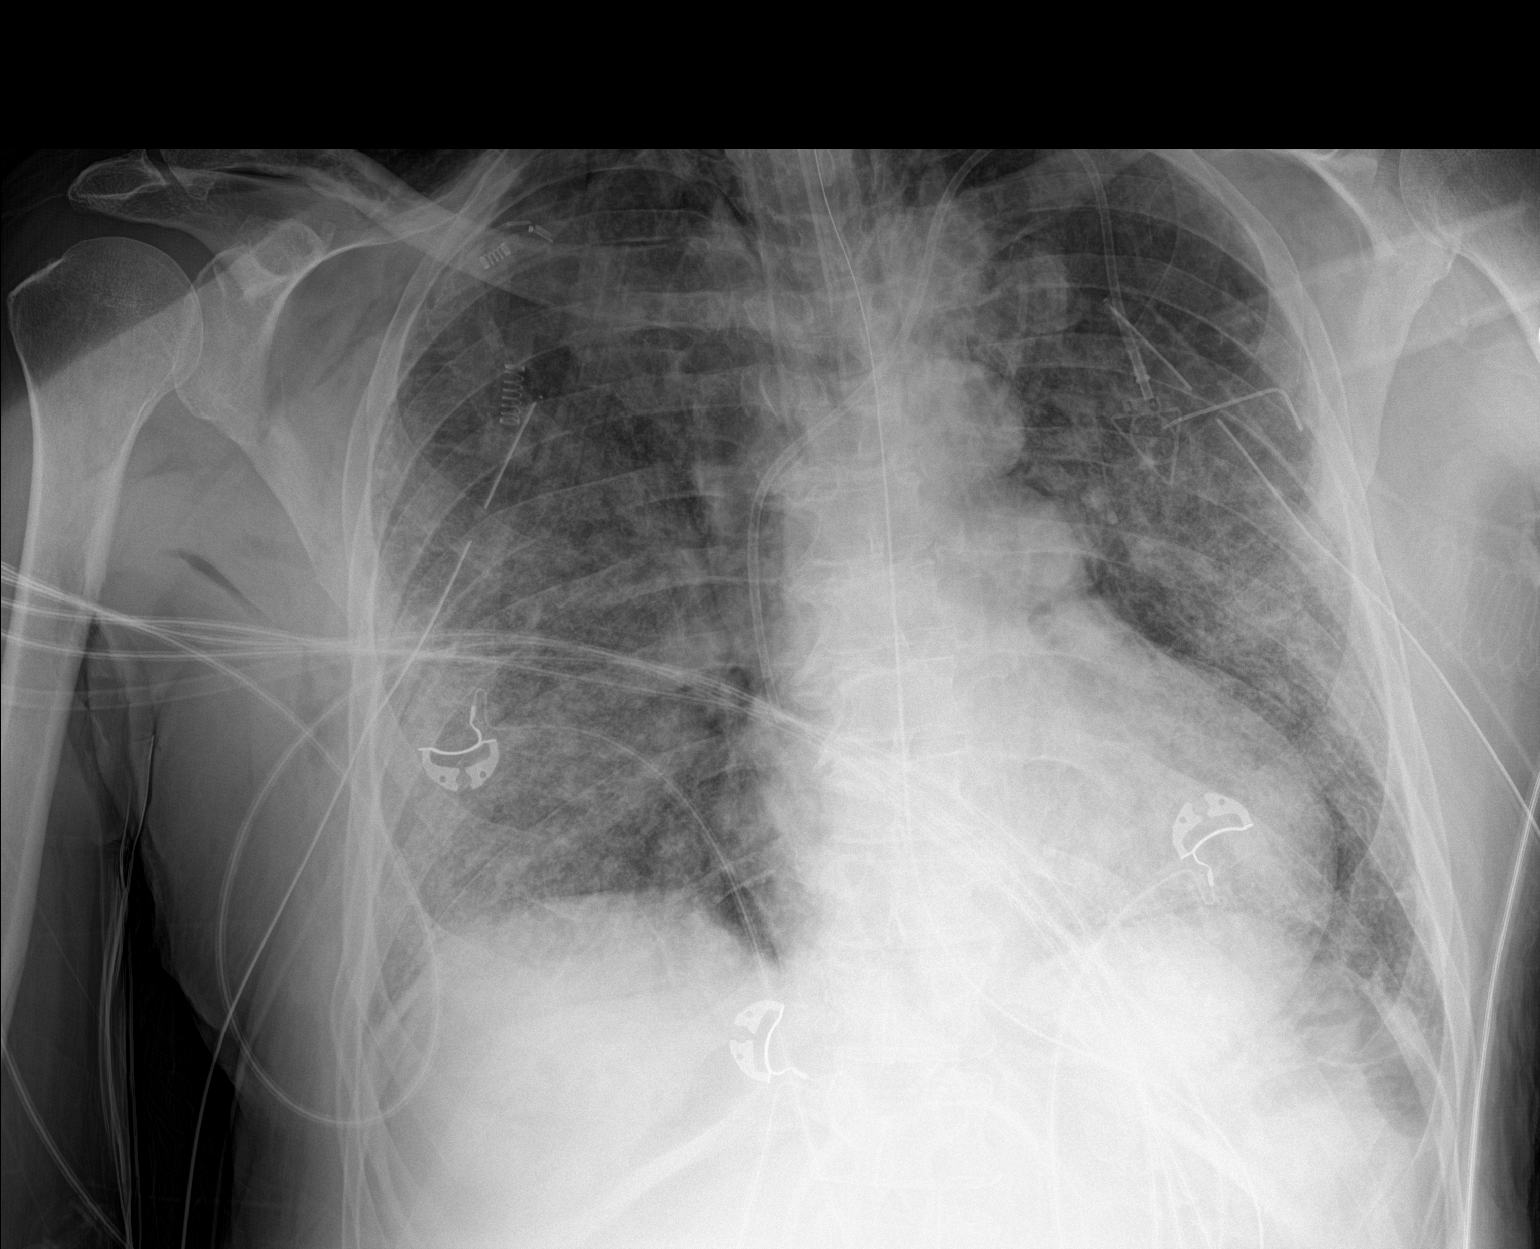

[1 of 1 positions shown; findings below may reference images not displayed]

FINDINGS: Endotracheal tube tip about 5.2 cm superior to the carina.
Left-sided central venous port tip over the cavoatrial region.
Esophageal tube tip below the diaphragm but incompletely visualized.

Bilateral chest tubes remain in place. Suspected small apical
pneumothoraces. Extensive soft tissue emphysema. Pneumomediastinum
grossly unchanged. Stable cardiomediastinal silhouette. No
significant change in fairly extensive bilateral ground-glass
opacities and consolidations.
IMPRESSION: 1. Support lines and tubes as above.
2. Suspected small apical pneumothoraces without significant change.
3. No significant change in bilateral airspace opacities, chest wall
emphysema and probable pneumomediastinum

## 2022-01-05 IMAGING — DX DG CHEST 1V PORT
1 series · 1 of 1 positions shown · non-contrast
Comparison: 07/20/2019

CLINICAL DATA: Bilateral chest tubes.  Respiratory failure.

EXAM:
PORTABLE CHEST 1 VIEW

[chest ap]
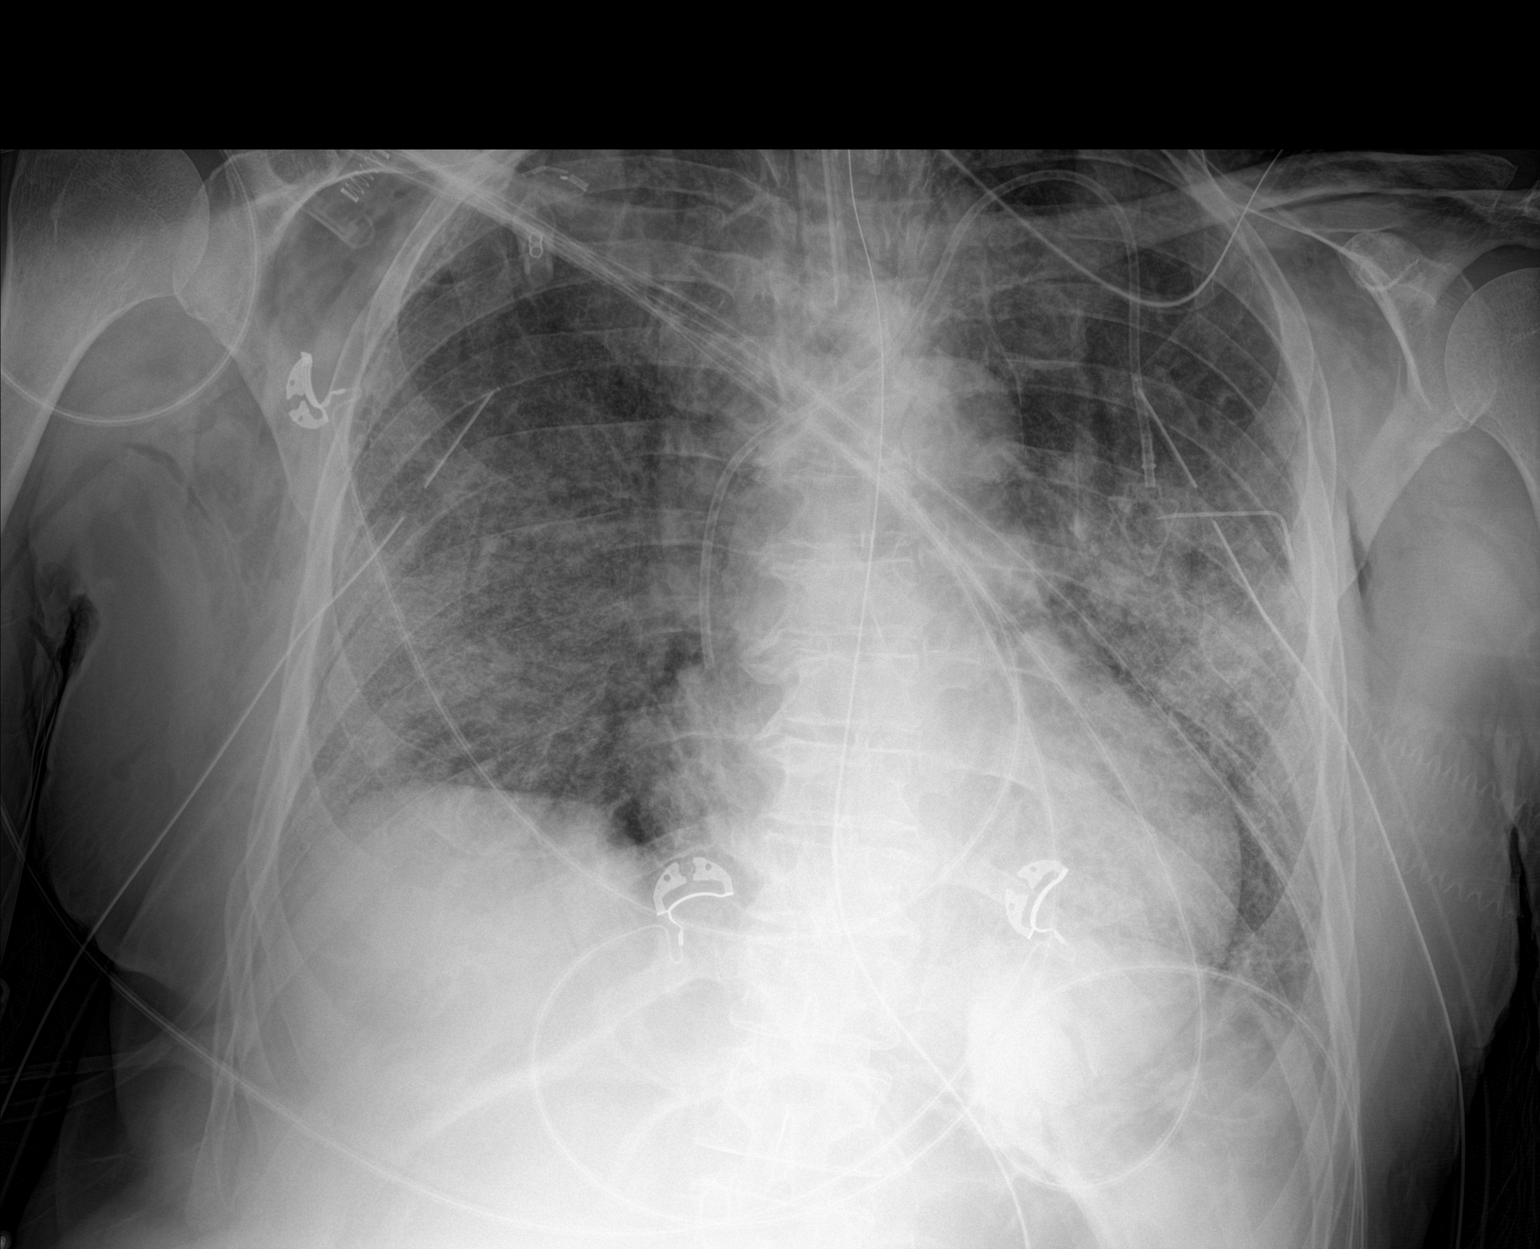

[1 of 1 positions shown; findings below may reference images not displayed]

FINDINGS: Endotracheal tube with tip 6 cm above the carina. Nasogastric tube
courses into the stomach and off the film as tip is not visualized.
Single bilateral chest tubes and left IJ central venous catheter
unchanged.

Lungs are adequately inflated with continued bilateral hazy airspace
opacification. No effusion. Stable small apical right pneumothorax.
Possible tiny left apical pneumothorax without significant change.
Cardiomediastinal silhouette and remainder of the exam is unchanged.
IMPRESSION: 1. Stable bilateral hazy airspace process which may be due to edema
versus infection.

2. Bilateral chest tubes unchanged. Suggestion of tiny bilateral
apical pneumothoraces unchanged. Remaining tubes and lines as
described.

## 2022-01-06 IMAGING — DX DG CHEST 1V PORT
1 series · 2 of 2 positions shown · non-contrast
Comparison: July 21, 2019

CLINICAL DATA: Respiratory failure

EXAM:
PORTABLE CHEST 1 VIEW

[Series 1: chest ap · 0.14mm/px · 2 of 2 slices shown]
[im 1/2]
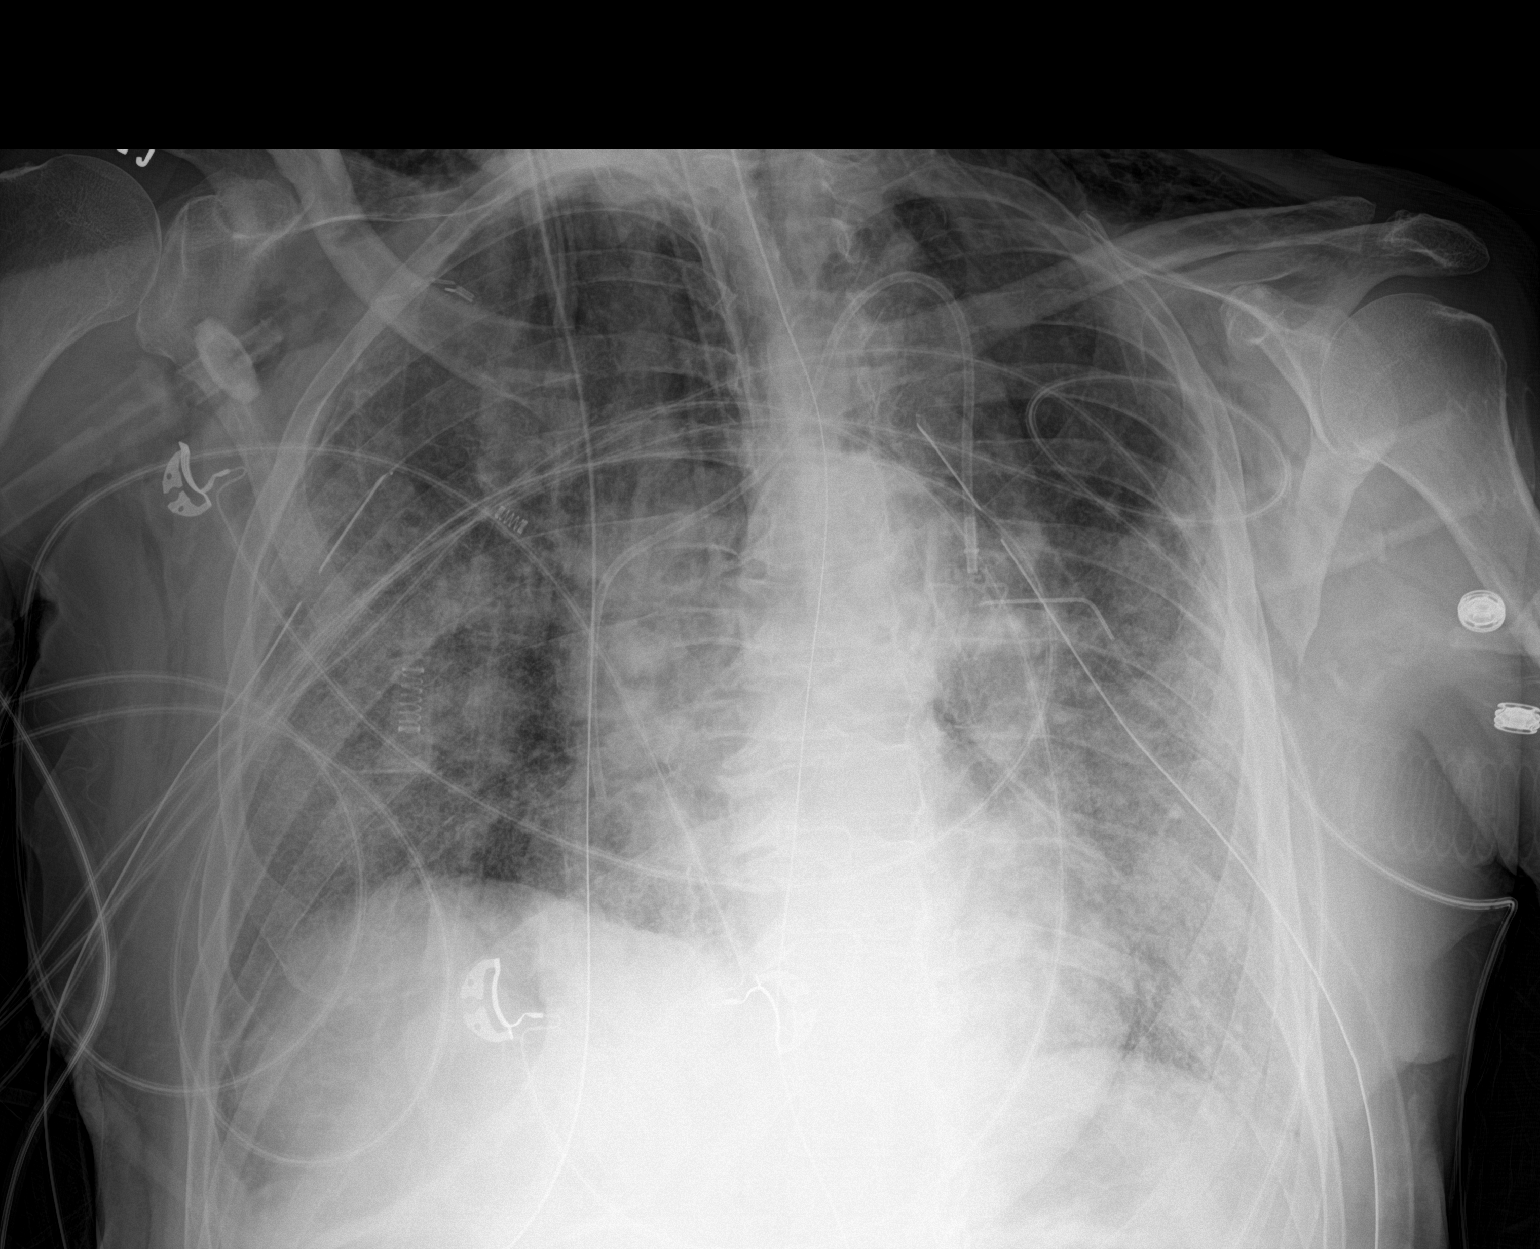
[im 2/2]
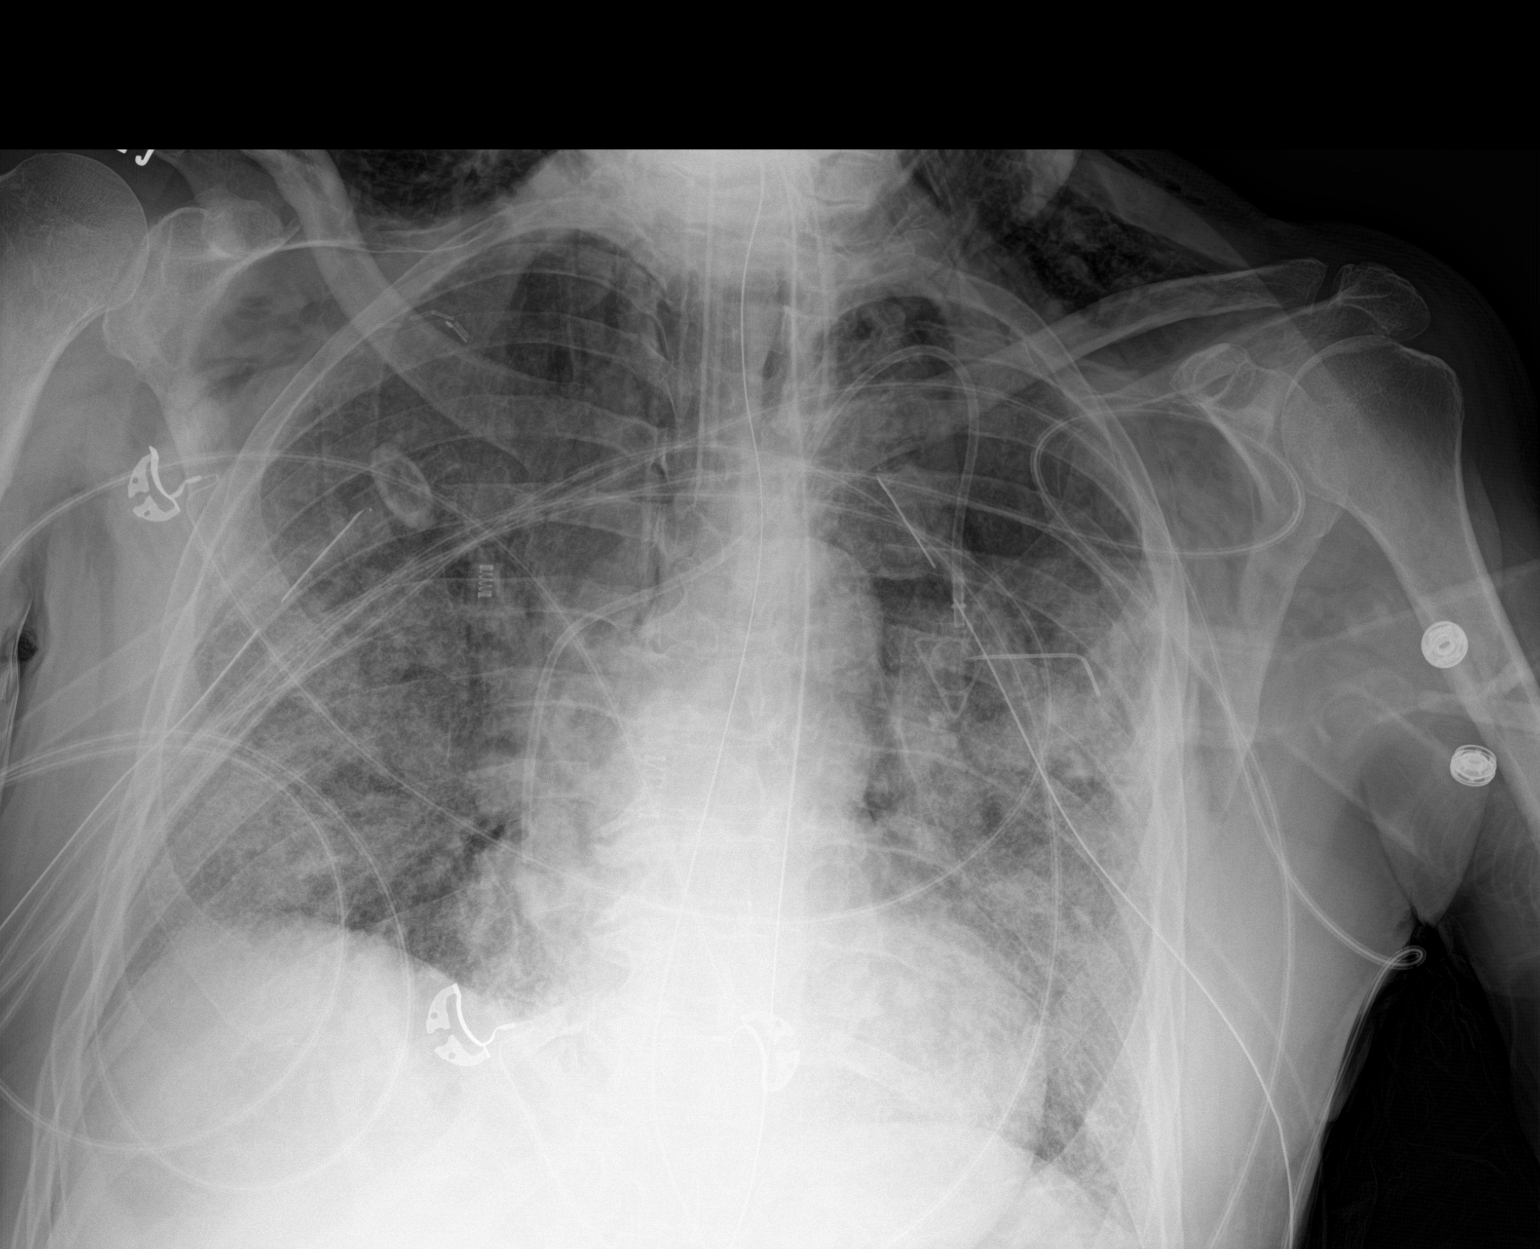

[2 of 2 positions shown; findings below may reference images not displayed]

FINDINGS: Endotracheal tube tip is 3.8 cm above the carina. Port-A-Cath tip is
at the cavoatrial junction. Nasogastric tube tip and side port are
below the diaphragm. There is a chest tube on each side. There is a
rather minimal pneumothorax on each side. No tension component.
Extensive subcutaneous air remains.

There is patchy airspace opacity bilaterally, most notably in the
mid and lower lung zones, stable. No new opacity evident. Heart is
upper normal in size with pulmonary vascularity within normal
limits. No adenopathy. No bone lesions.
IMPRESSION: Tube and catheter positions as described. Minimal pneumothorax on
each side. Extensive subcutaneous air remains.

There is stable airspace opacity bilaterally, primarily in the mid
and lower lung zones. No new opacity evident. Stable cardiac
silhouette.

## 2022-01-09 IMAGING — DX DG CHEST 1V PORT
1 series · 1 of 1 positions shown · non-contrast
Comparison: 07/24/2019.

CLINICAL DATA: Respiratory failure.  Chest tubes.

EXAM:
PORTABLE CHEST 1 VIEW

[chest ap]
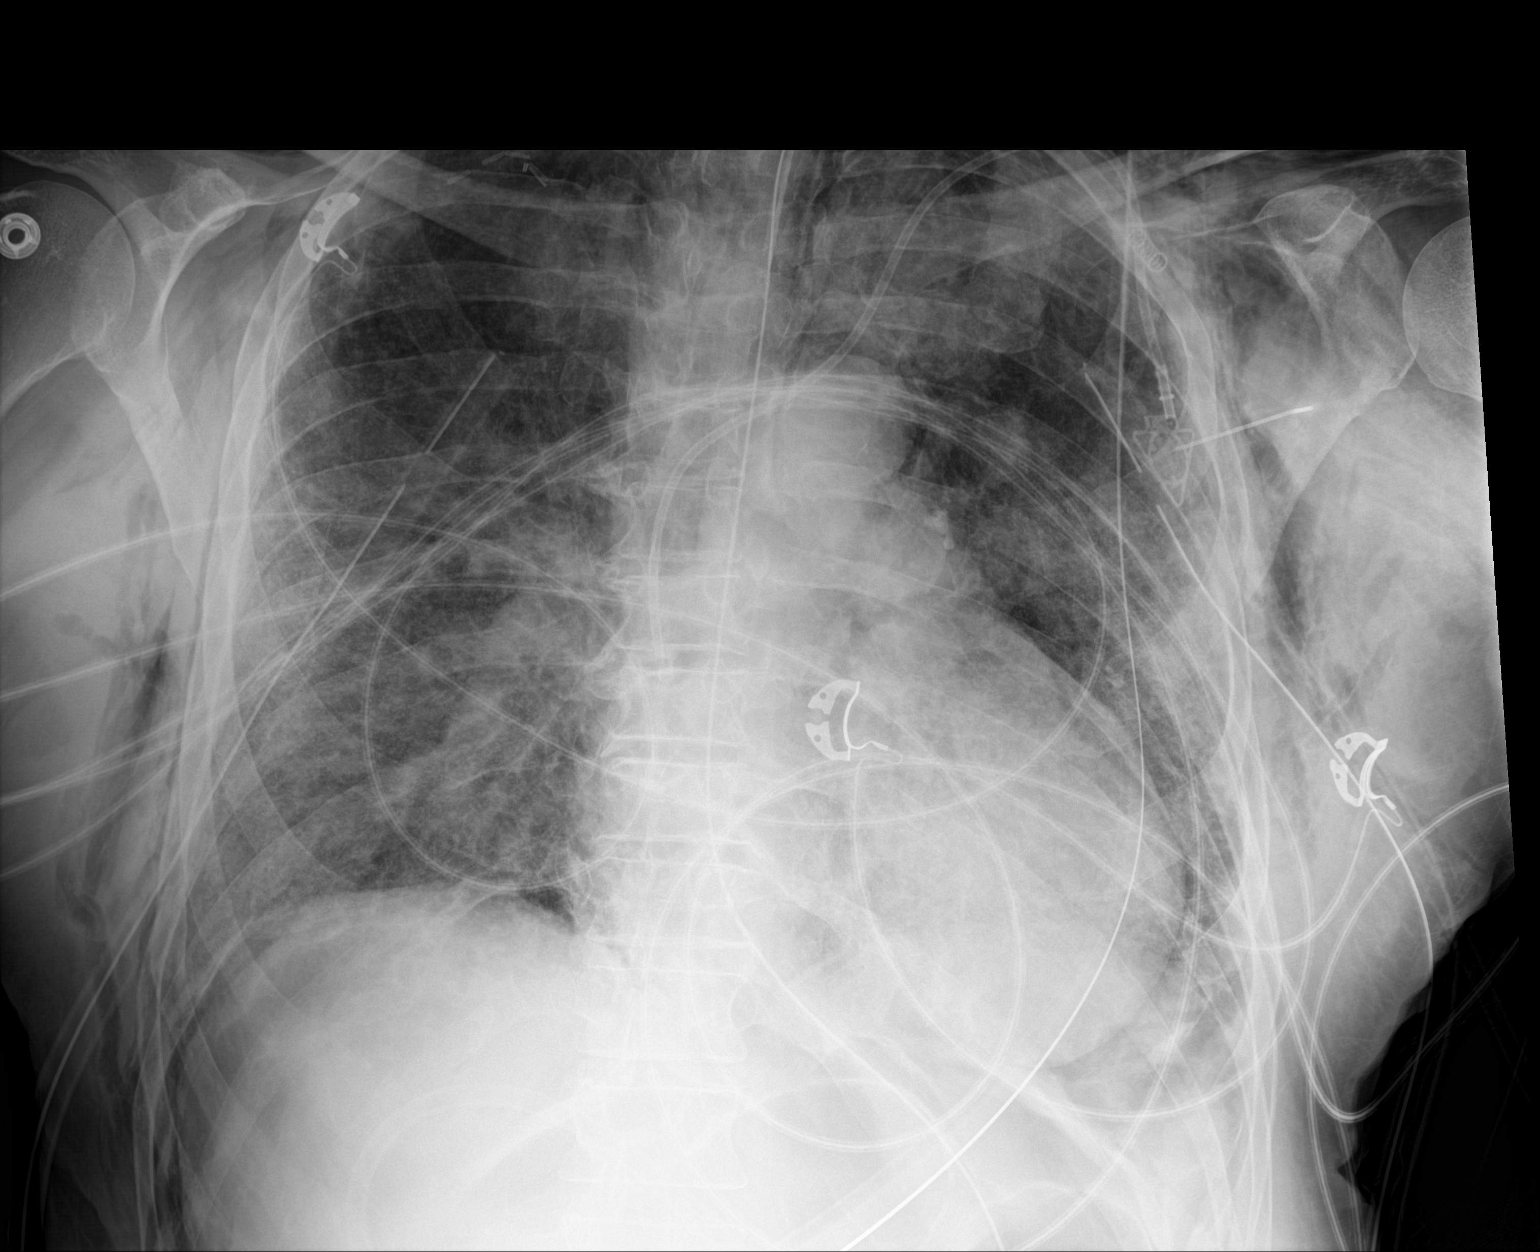

[1 of 1 positions shown; findings below may reference images not displayed]

FINDINGS: Surgical clips right upper chest. Endotracheal tube, NG tube,
PowerPort catheter, bilateral chest tubes in stable position. Stable
cardiomegaly. Diffuse bilateral airspace disease again noted without
interim change. No pleural effusion. Tiny left pneumothorax again
may be present. Diffuse chest wall subcutaneous emphysema is again
noted.
IMPRESSION: 1. Lines and tubes including bilateral chest tubes in stable
position. Tiny left pneumothorax again may be present. Diffuse chest
wall subcutaneous emphysema again noted.

2.  Stable cardiomegaly.

3.  Diffuse bilateral airspace again noted without interim change.

## 2022-01-10 IMAGING — DX DG CHEST 1V PORT
1 series · 1 of 1 positions shown · non-contrast
Comparison: Yesterday

CLINICAL DATA: Respiratory failure

EXAM:
PORTABLE CHEST 1 VIEW

[chest ap]
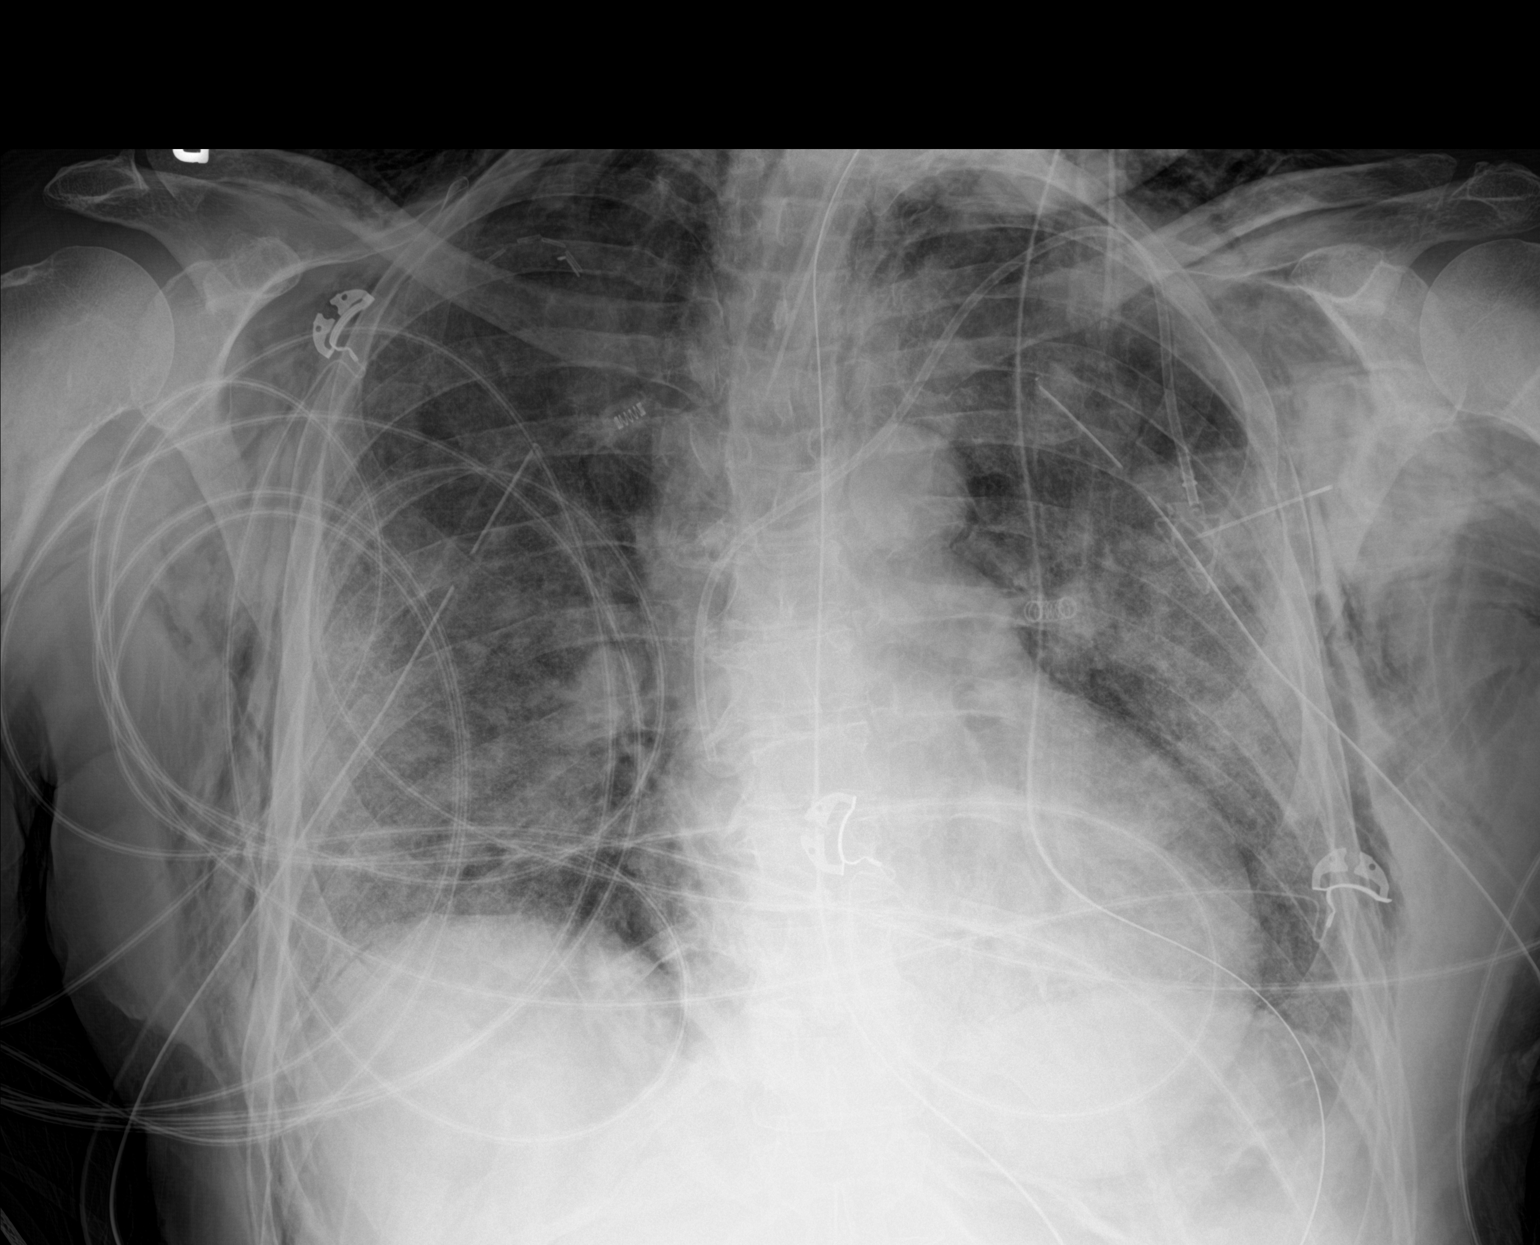

[1 of 1 positions shown; findings below may reference images not displayed]

FINDINGS: Endotracheal tube with tip at the clavicular heads. The enteric tube
reaches the stomach at least. Left port with tip at the upper
cavoatrial junction. Bilateral chest tube in place. Extensive chest
wall emphysema. Trace left apical pneumothorax persists. Generalized
interstitial and ground-glass opacity. Stable generous heart size
IMPRESSION: Stable hardware positioning, ground-glass opacity, and trace left
apical pneumothorax.

## 2022-01-11 IMAGING — DX DG CHEST 1V PORT
1 series · 1 of 1 positions shown · non-contrast
Comparison: One day prior

CLINICAL DATA: On mechanically assisted ventilation.

EXAM:
PORTABLE CHEST 1 VIEW

[chest ap]
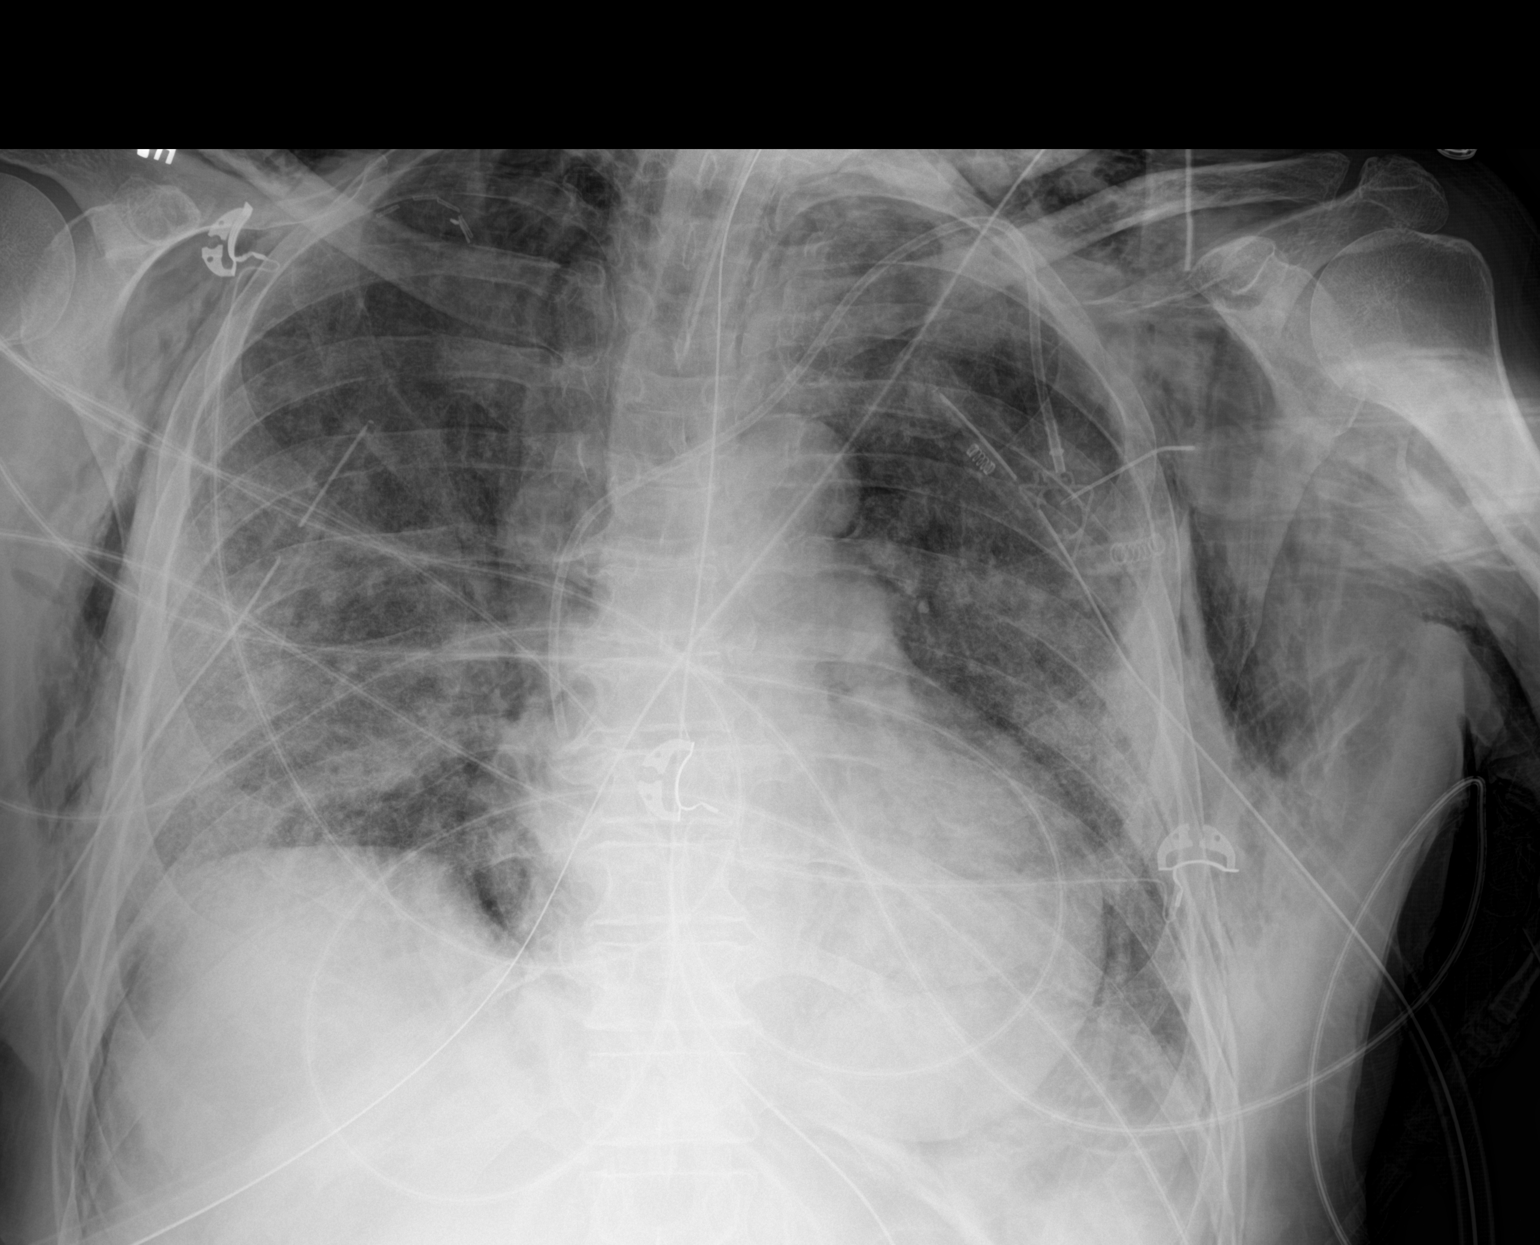

[1 of 1 positions shown; findings below may reference images not displayed]

FINDINGS: Endotracheal tube is unchanged. Left central line terminates at low
SVC. Bilateral chest tubes. Nasogastric tube extends beyond the
inferior aspect of the film.

Mild cardiomegaly. No definite pleural fluid. Previously described
left apical pneumothorax is no longer identified. Extensive
subcutaneous emphysema about the chest bilaterally.

Lower lung predominant airspace disease is not significantly
changed.

The right hemidiaphragm is well visualized.
IMPRESSION: Bilateral chest tubes in place. No residual left apical pneumothorax
identified. The right hemidiaphragm is well visualized and a sub
pulmonic pneumothorax cannot be excluded.

Similar mild cardiomegaly and basilar predominant airspace disease.

Aortic Atherosclerosis ([id number]-YLF.F).

## 2022-01-12 IMAGING — DX DG CHEST 1V PORT
1 series · 1 of 1 positions shown · non-contrast
Comparison: 1 day prior

CLINICAL DATA: Respiratory failure with hypoxemia

EXAM:
PORTABLE CHEST 1 VIEW

[chest ap]
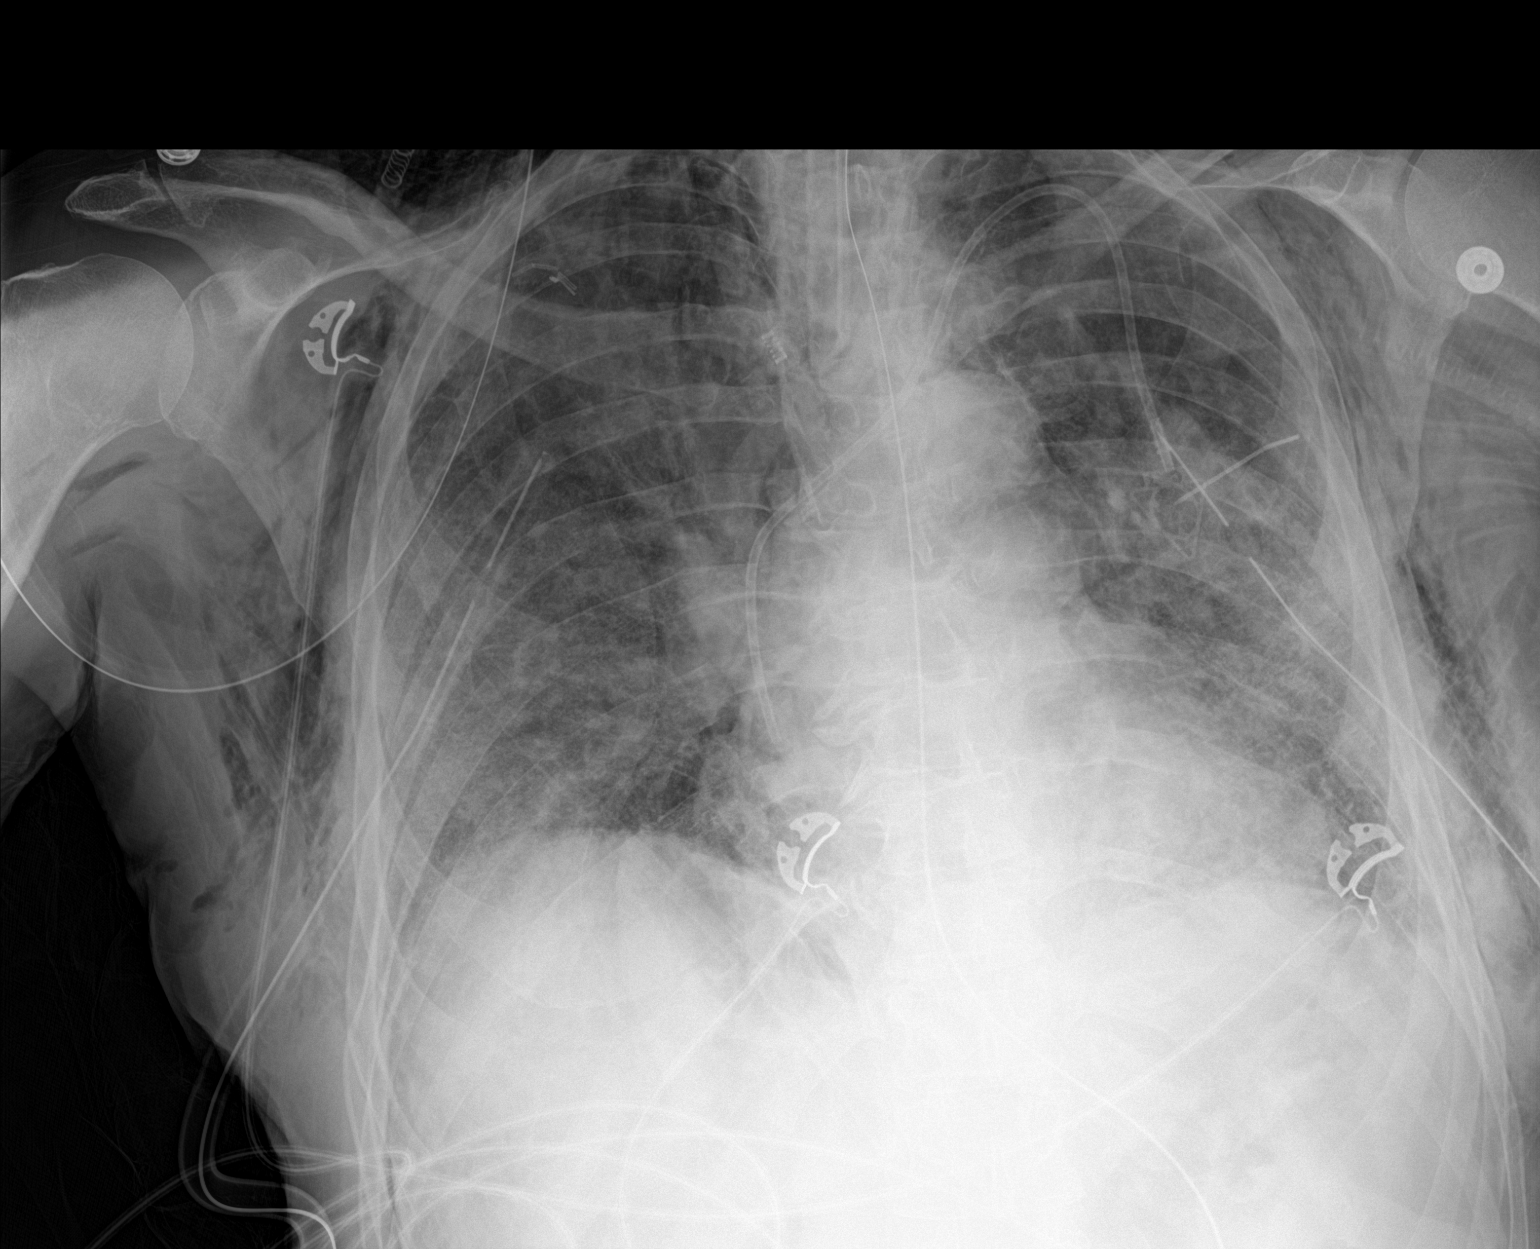

[1 of 1 positions shown; findings below may reference images not displayed]

FINDINGS: Endotracheal tube terminates 4.8 cm above carina. Left-sided
Port-A-Cath tip at superior caval/atrial junction. Nasogastric tube
extends beyond the inferior aspect of the film. Bilateral chest
tubes in place. Normal heart size. No pleural fluid. Extensive
subcutaneous emphysema bilaterally. This decreases sensitivity for
pneumothorax. Given this limitation, no apical pneumothorax
identified. The medial right heart border is well delineated and a
inferior medial right-sided pneumothorax cannot be excluded. Similar
basilar predominant bilateral airspace disease.
IMPRESSION: 1. Bilateral chest tubes in place. Cannot exclude inferior medial
right pneumothorax.
2. Similar bilateral airspace disease.
3. Extensive subcutaneous emphysema.

## 2022-01-12 IMAGING — DX DG CHEST 1V PORT
1 series · 1 of 1 positions shown · non-contrast
Comparison: 07/28/2019

CLINICAL DATA: Respiratory difficulty

EXAM:
PORTABLE CHEST 1 VIEW

[chest ap]
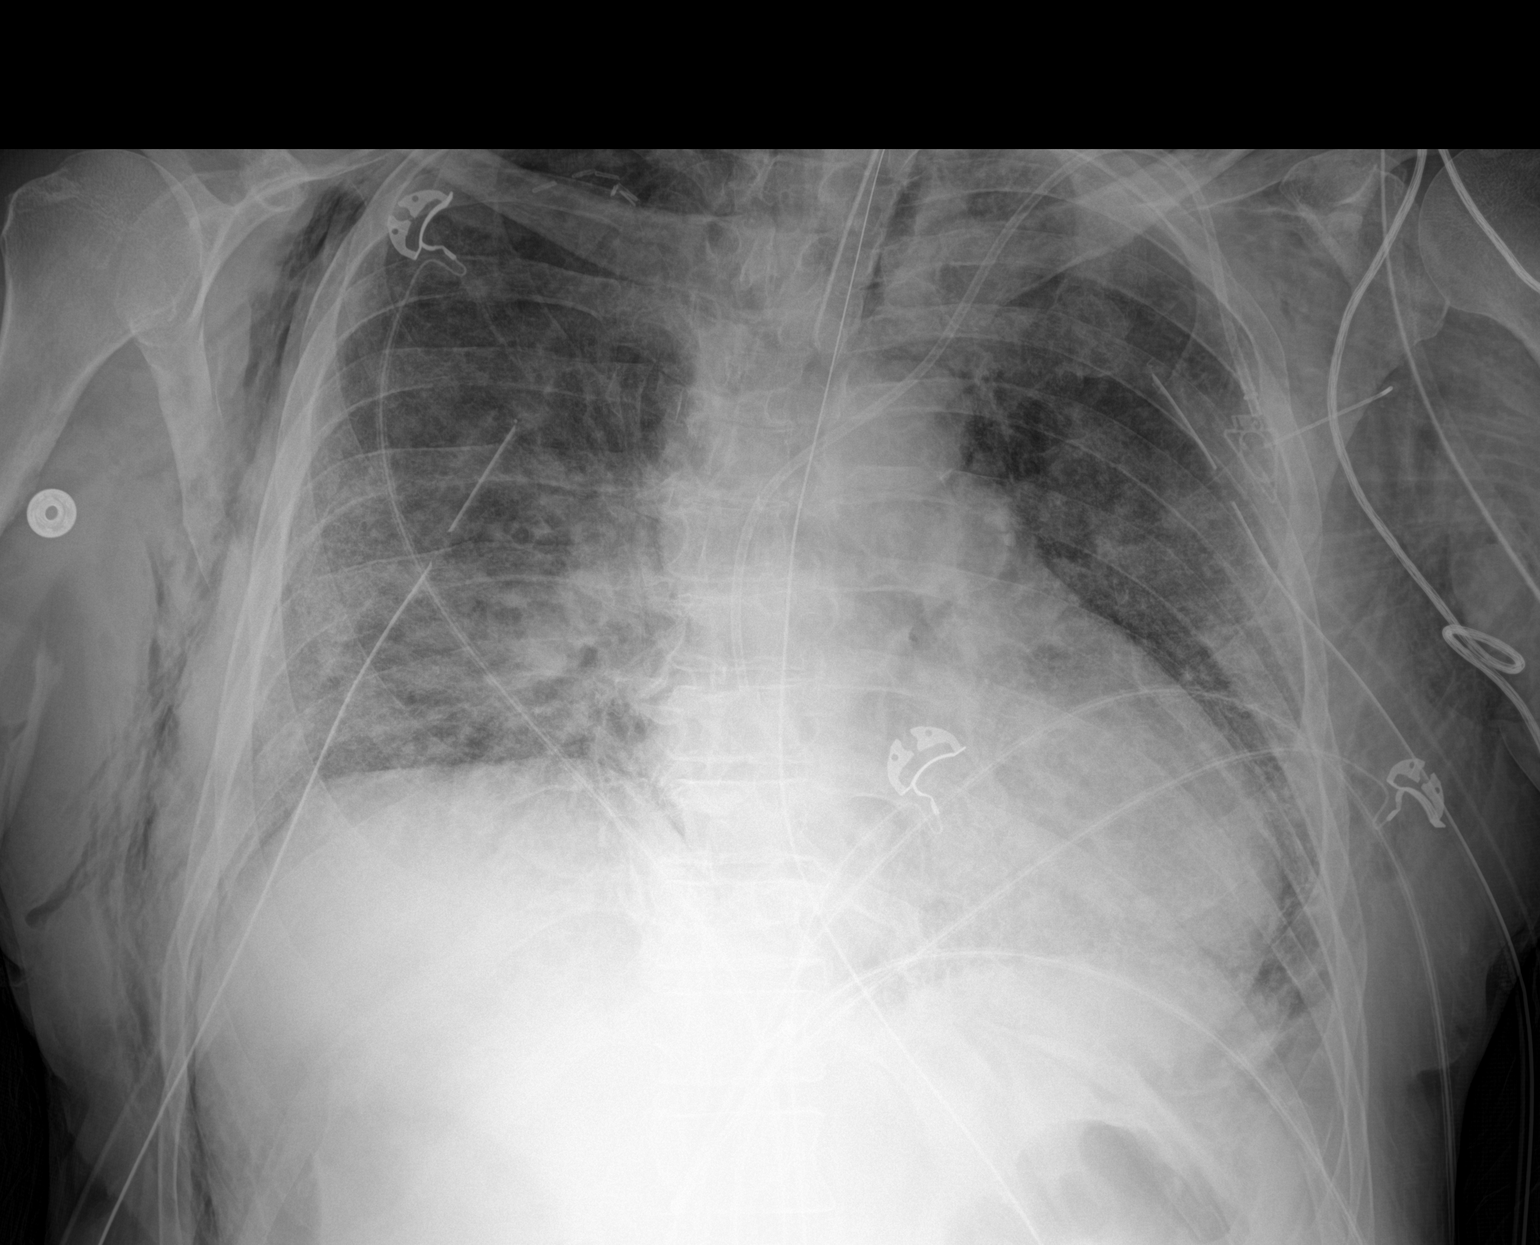

[1 of 1 positions shown; findings below may reference images not displayed]

FINDINGS: Support devices remain in place, unchanged. Bilateral chest tubes in
place. No visible pneumothorax. Subcutaneous emphysema, unchanged.
Diffuse bilateral airspace disease is stable. No visible effusions.
Heart is borderline in size.
IMPRESSION: Support devices stable, including bilateral chest tubes. No visible
pneumothorax.

Bilateral airspace disease, unchanged.

Stable subcutaneous emphysema diffusely.

## 2022-01-14 IMAGING — DX DG CHEST 1V PORT
1 series · 1 of 1 positions shown · non-contrast
Comparison: Same day.

CLINICAL DATA: Status post tracheostomy.

EXAM:
PORTABLE CHEST 1 VIEW

[chest ap]
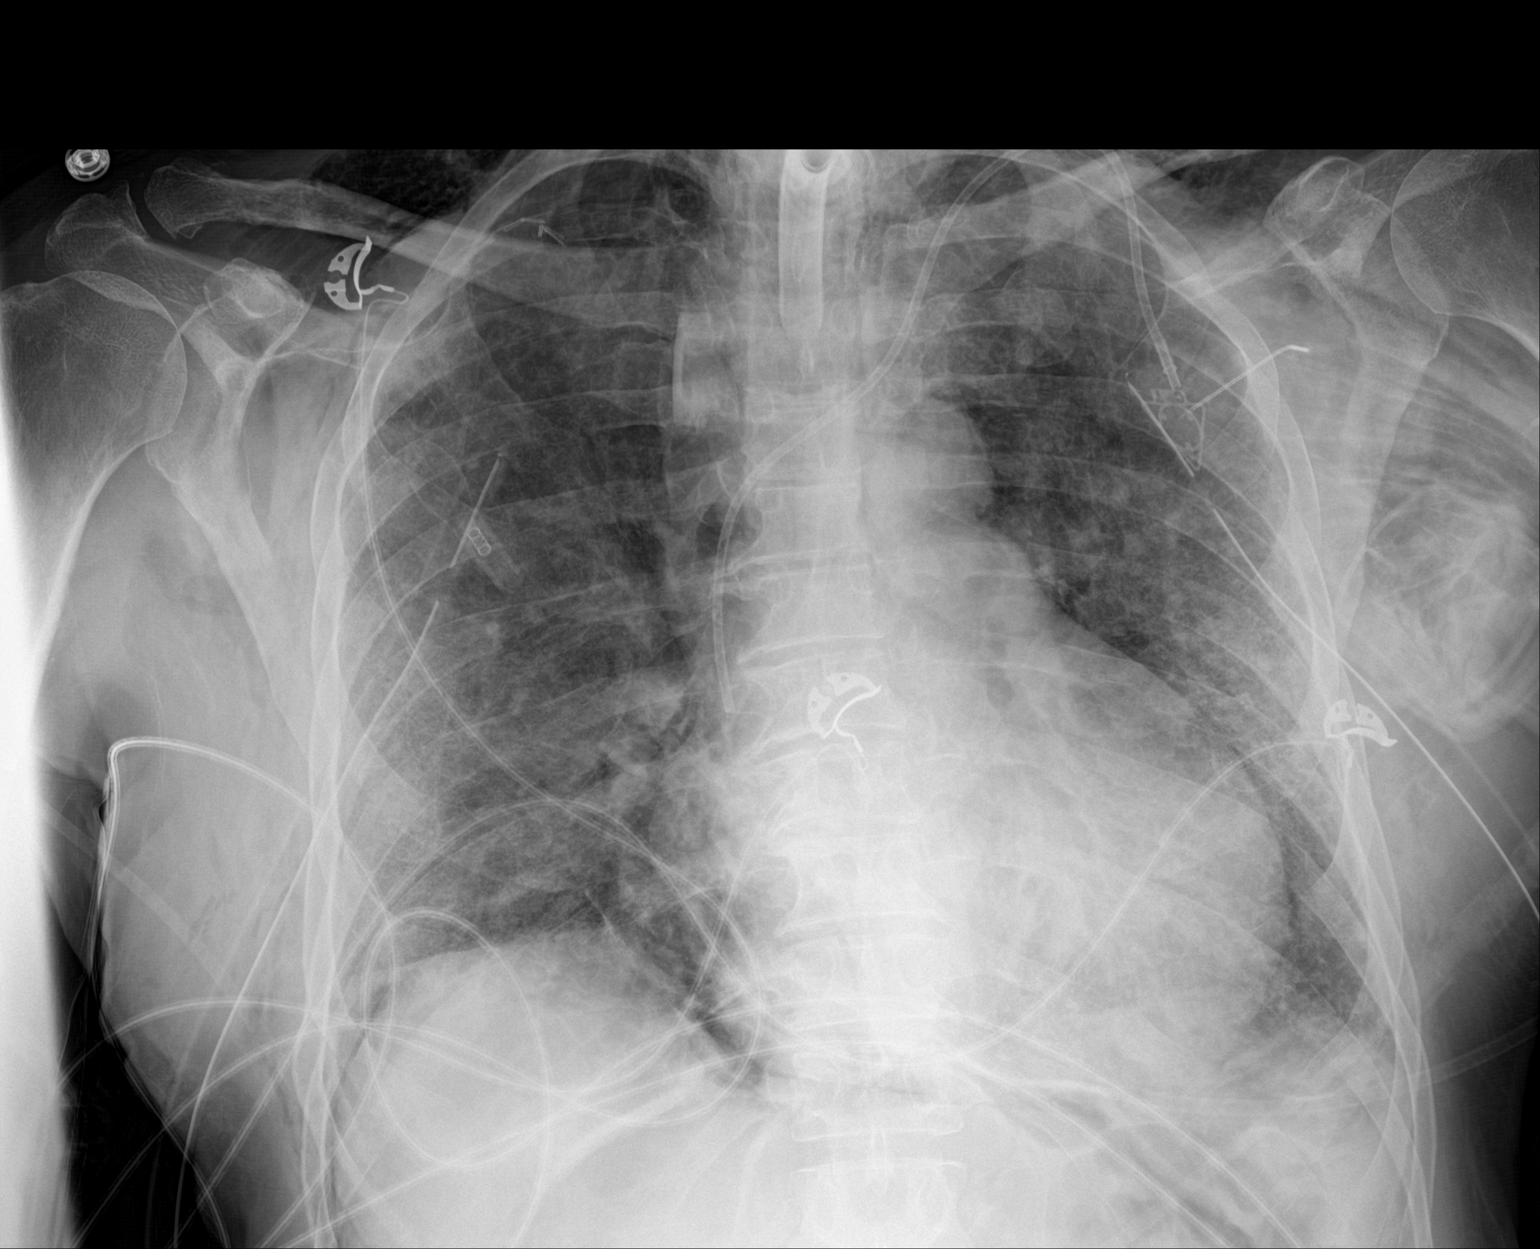

[1 of 1 positions shown; findings below may reference images not displayed]

FINDINGS: Stable cardiomegaly. Tracheostomy tube is in grossly good position.
Left internal jugular Port-A-Cath is noted in grossly good position.
Bilateral chest tubes are noted without definite pneumothorax.
Stable subcutaneous emphysema is noted. Stable bilateral lung
opacities are noted concerning for multifocal pneumonia. Bony thorax
is unremarkable.
IMPRESSION: Tracheostomy tube in grossly good position. Bilateral chest tubes
are noted without definite pneumothorax. Stable bilateral lung
opacities are noted concerning for multifocal pneumonia.

## 2022-01-14 IMAGING — DX DG ABDOMEN 1V
1 series · 1 of 1 positions shown · non-contrast
Comparison: 07/18/2019

CLINICAL DATA: Feeding tube placement.

EXAM:
ABDOMEN - 1 VIEW

[abdomen kub]
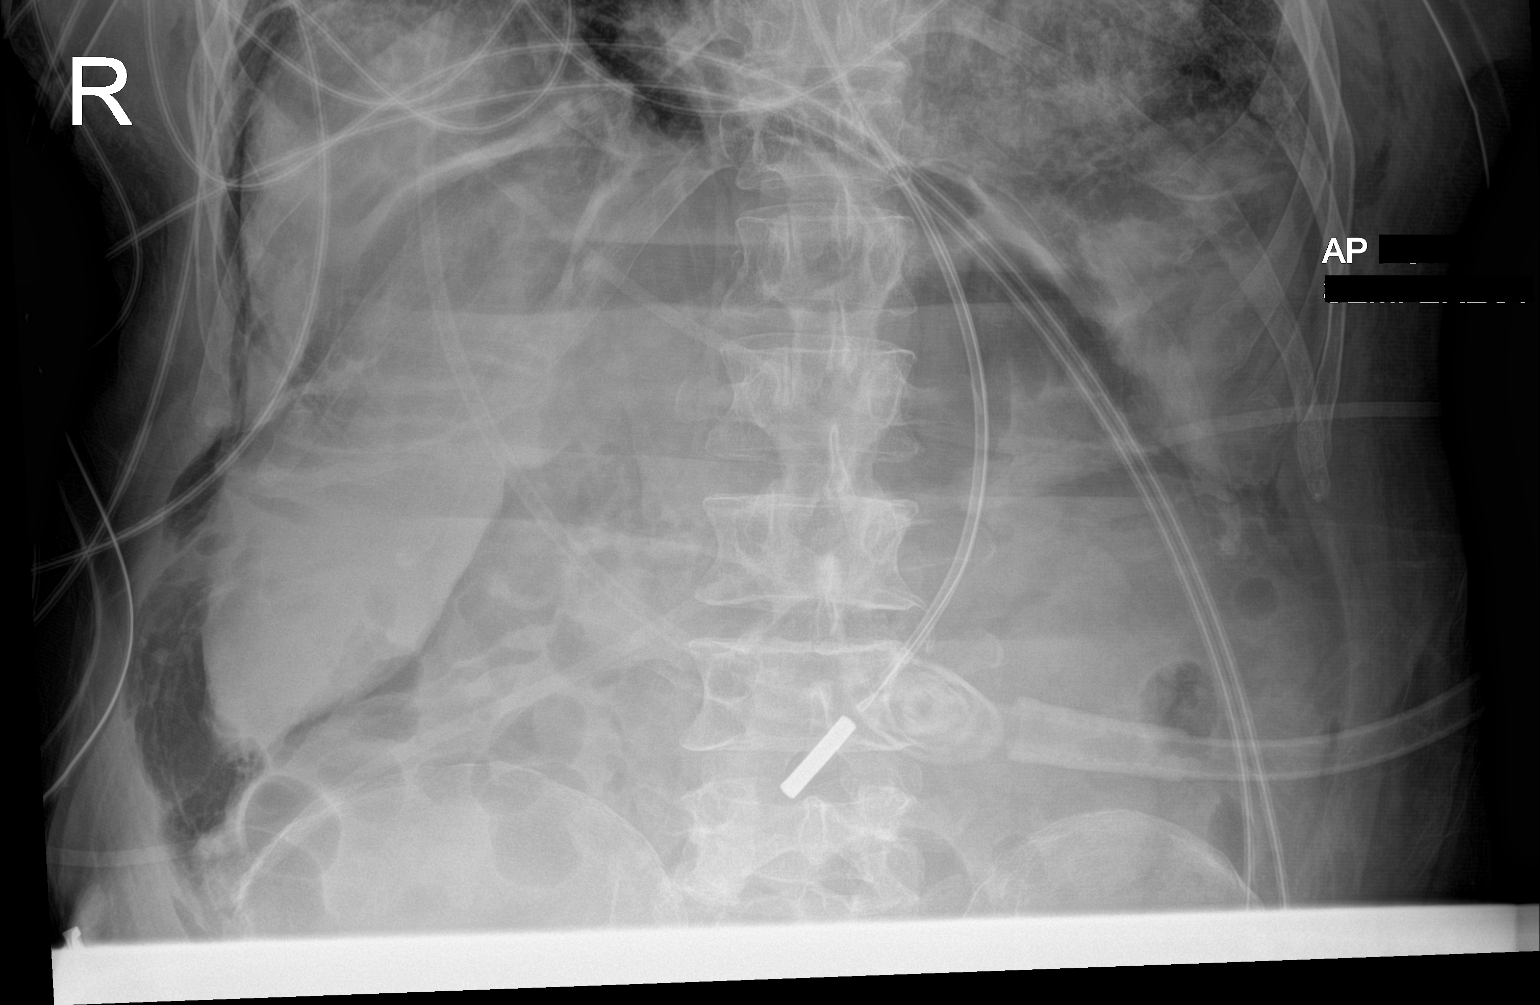

[1 of 1 positions shown; findings below may reference images not displayed]

FINDINGS: Interval large amount of free peritoneal air. Feeding tube tip in
the distal stomach. Normal bowel gas pattern. Lumbar spine
degenerative changes.
IMPRESSION: 1. Interval large amount of free peritoneal air suspicious for bowel
perforation. Based on the appearance, this may represent air that
has tracked down from the pleural space. The patient had bilateral
subcutaneous emphysema and bilateral chest tubes in place on a
portable chest earlier today.
2. Feeding tube tip in the distal stomach.

Critical Value/emergent results were called by telephone at the time
of interpretation on 07/30/2019 at [DATE] to provider Dr. Fique
Abdeddayem, who verbally acknowledged these results.

## 2022-01-14 IMAGING — CT CT ABD-PELV W/O CM
2 of 4 series · 15 of 46 positions shown, 17 images · non-contrast
Comparison: Abdominal radiograph dated 07/30/[address] CT dated
02/28/2019. chest radiograph dated 07/30/2019. Chest CT dated
07/08/2019

CLINICAL DATA: 63-year-old male with concern for peritonitis or
bowel perforation.

EXAM:
CT ABDOMEN AND PELVIS WITHOUT CONTRAST
TECHNIQUE: Multidetector CT imaging of the abdomen and pelvis was performed
following the standard protocol without IV contrast.

[Series 2: axial (person_name) (person_name) · axial · 0.72mm/px · z∈[+1005,+1460]mm · 12 of 103 slices shown, 14 images]
[im 6/103  soft-tissue]
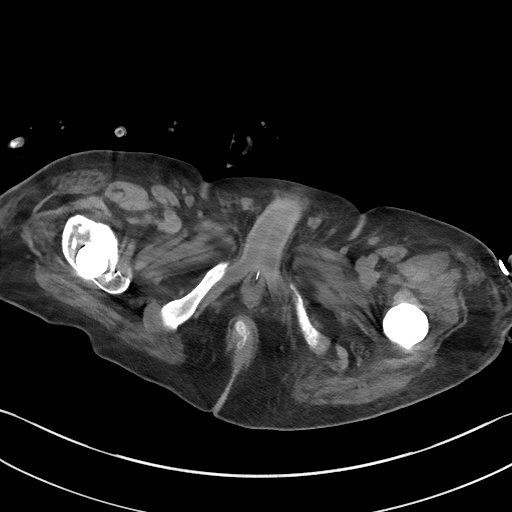
[im 6/103  bone]
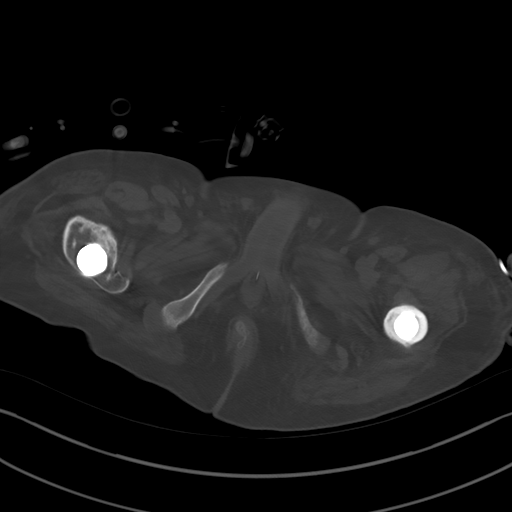
[im 18/103  soft-tissue]
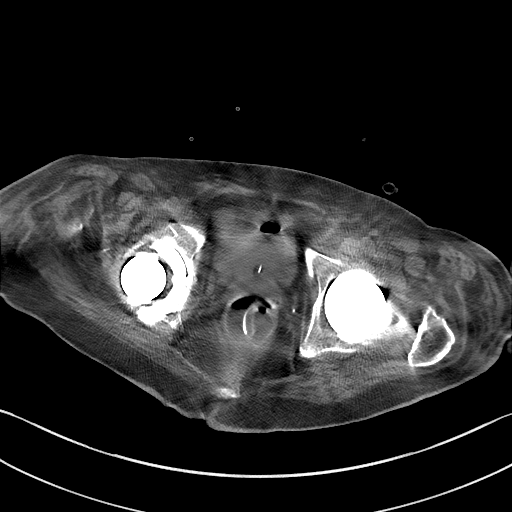
[im 23/103  soft-tissue]
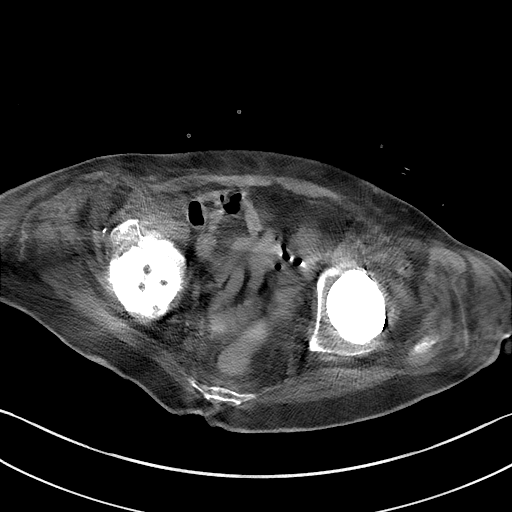
[im 29/103  soft-tissue]
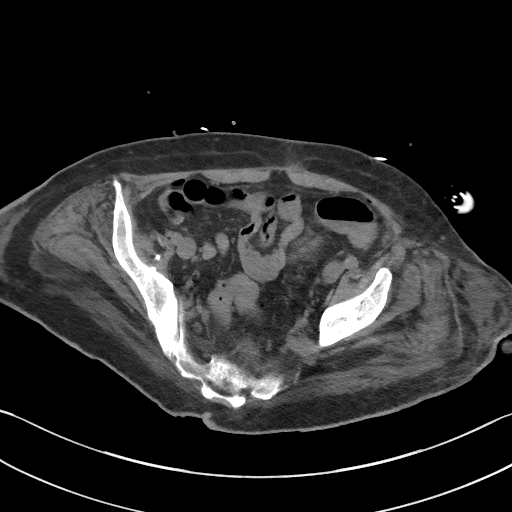
[im 40/103  soft-tissue]
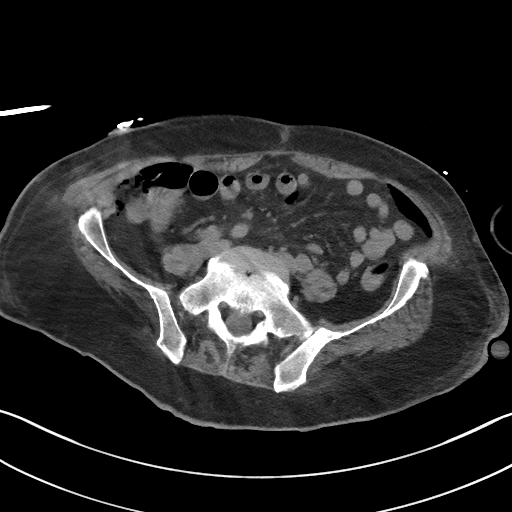
[im 46/103  soft-tissue]
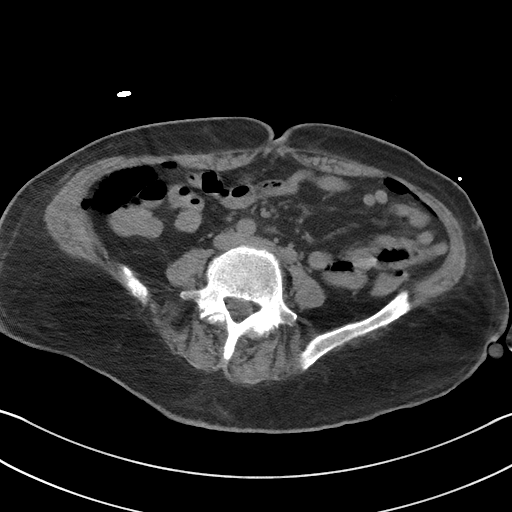
[im 57/103  soft-tissue]
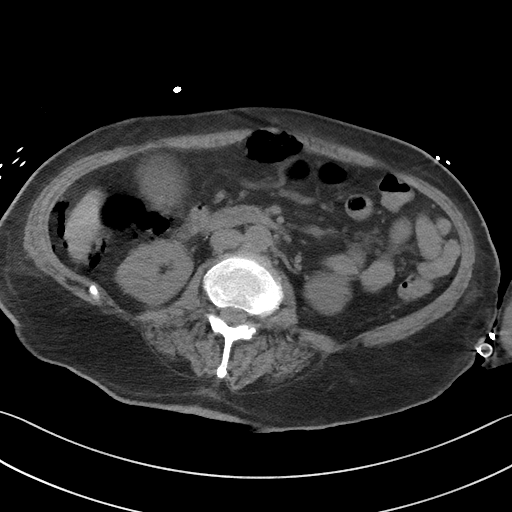
[im 63/103  soft-tissue]
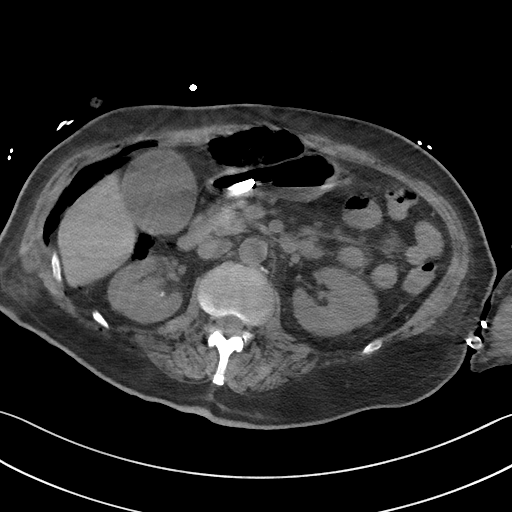
[im 74/103  soft-tissue]
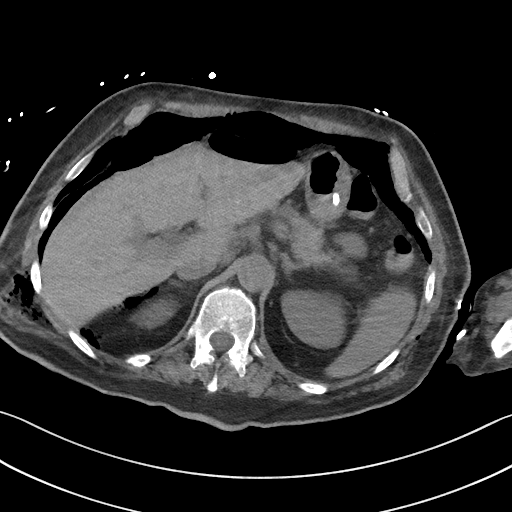
[im 74/103  bone]
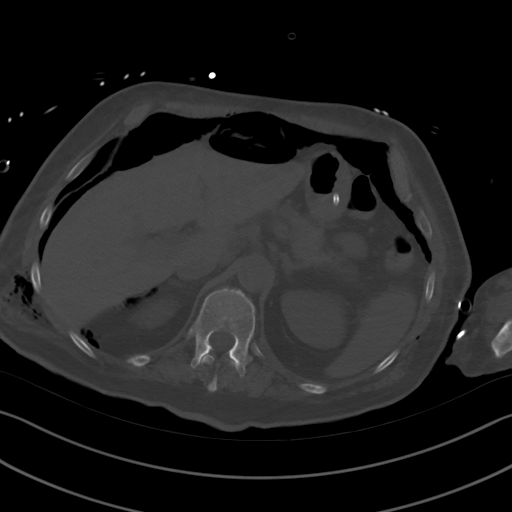
[im 80/103  soft-tissue]
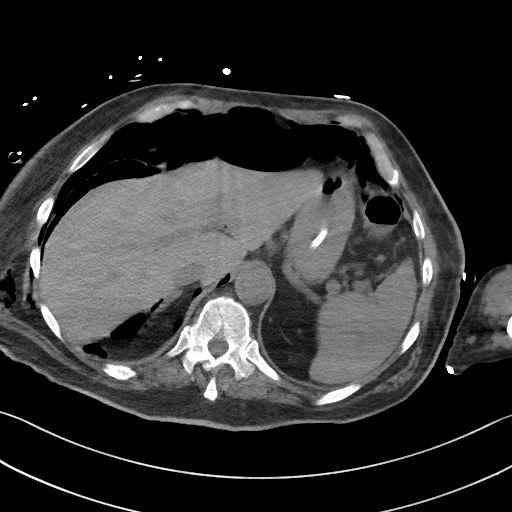
[im 86/103  soft-tissue]
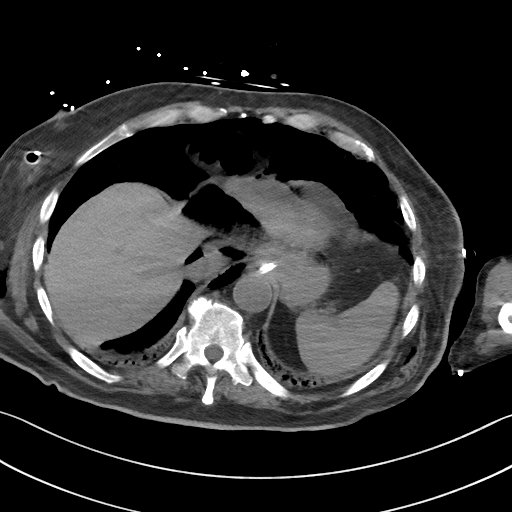
[im 97/103  soft-tissue]
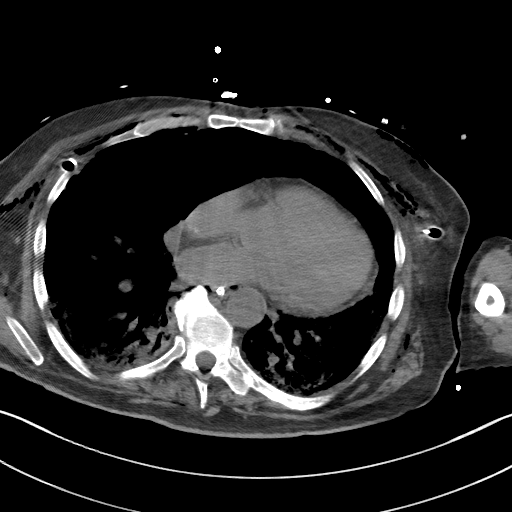

[Series 5: coronal st · coronal · 0.84mm/px · 3 of 81 slices shown]
[im 27/81  soft-tissue]
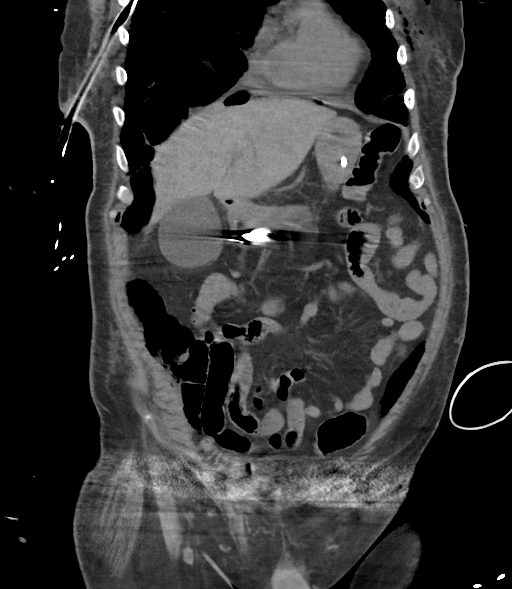
[im 36/81  soft-tissue]
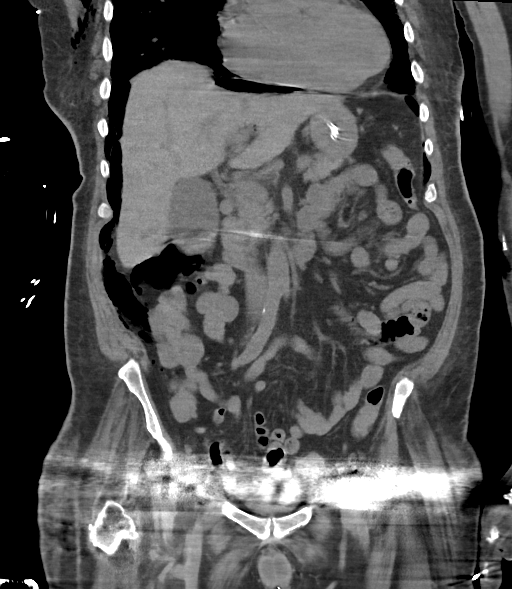
[im 45/81  soft-tissue]
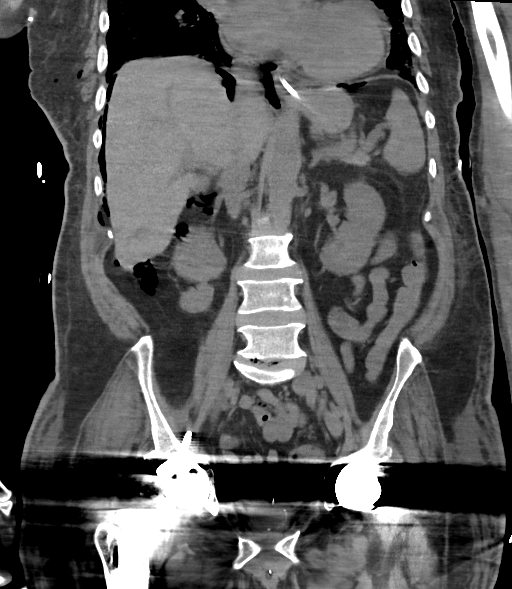

[15 of 46 positions shown; findings below may reference images not displayed]

FINDINGS: Evaluation of this exam is limited in the absence of intravenous
contrast. Evaluation is also limited due to streak artifact caused
by patient's arms.

Lower chest: Diffuse pulmonary ground-glass opacities with air
bronchogram. Partially visualized bilateral chest tubes and right
pneumothorax. A central venous line partially visualized at the
cavoatrial junction. There is extensive pneumomediastinum.

There is infra diaphragmatic extension of the pneumomediastinum with
air dissecting the tissue planes along the anterior pleural surfaces
and extending inferiorly into the pelvis. Additional pockets of air
in the right upper quadrant also likely extension from the
pneumothorax and pneumomediastinum. No free fluid.

Hepatobiliary: Patient the liver is grossly unremarkable. No
intrahepatic biliary ductal dilatation. The gallbladder is mildly
distended. Faint high attenuating content within the gallbladder may
represent sludge or vicarious excretion of recently administered
contrast. No pericholecystic fluid.

Pancreas: Unremarkable. No pancreatic ductal dilatation or
surrounding inflammatory changes.

Spleen: Normal in size without focal abnormality.

Adrenals/Urinary Tract: The adrenal glands are unremarkable. There
is a 3 mm nonobstructing right renal inferior pole calculus. No
hydronephrosis. The left kidney is unremarkable. The visualized
ureters appear unremarkable. The urinary bladder is decompressed
around a Foley catheter. Small amount of air within the bladder
likely introduced via catheter.

Stomach/Bowel: A rectal tube is noted. An enteric tube is seen with
tip in the distal stomach. There is sigmoid diverticulosis. There is
loose stool throughout the colon compatible with diarrheal state.
Correlation with clinical exam and stool cultures recommended. There
is no bowel obstruction. The appendix is normal.

Vascular/Lymphatic: The abdominal aorta and IVC are grossly
unremarkable on this noncontrast CT. No portal venous gas. There is
no adenopathy.

Reproductive: The prostate and seminal vesicles are grossly
unremarkable.

Other: Mild diffuse subcutaneous edema.

Musculoskeletal: Bilateral hip arthroplasties. Degenerative changes
of the spine. No acute osseous pathology.
IMPRESSION: 1. Intraperitoneal air, likely extension of the
pneumomediastinum/pneumothorax. An intra-abdominal etiology such as
bowel perforation is favored less likely. Clinical correlation is
recommended.
2. Extensive pneumomediastinum and partially visualized right
pneumothorax.
3. Diarrheal state. Correlation with clinical exam and stool
cultures recommended. No bowel obstruction. Normal appendix.
4. Sigmoid diverticulosis.
5. A 3 mm nonobstructing right renal inferior pole calculus. No
hydronephrosis.

## 2022-01-14 IMAGING — DX DG CHEST 1V PORT
1 series · 1 of 1 positions shown · non-contrast
Comparison: 07/28/2019

CLINICAL DATA: ARDS

EXAM:
PORTABLE CHEST 1 VIEW

[chest ap]
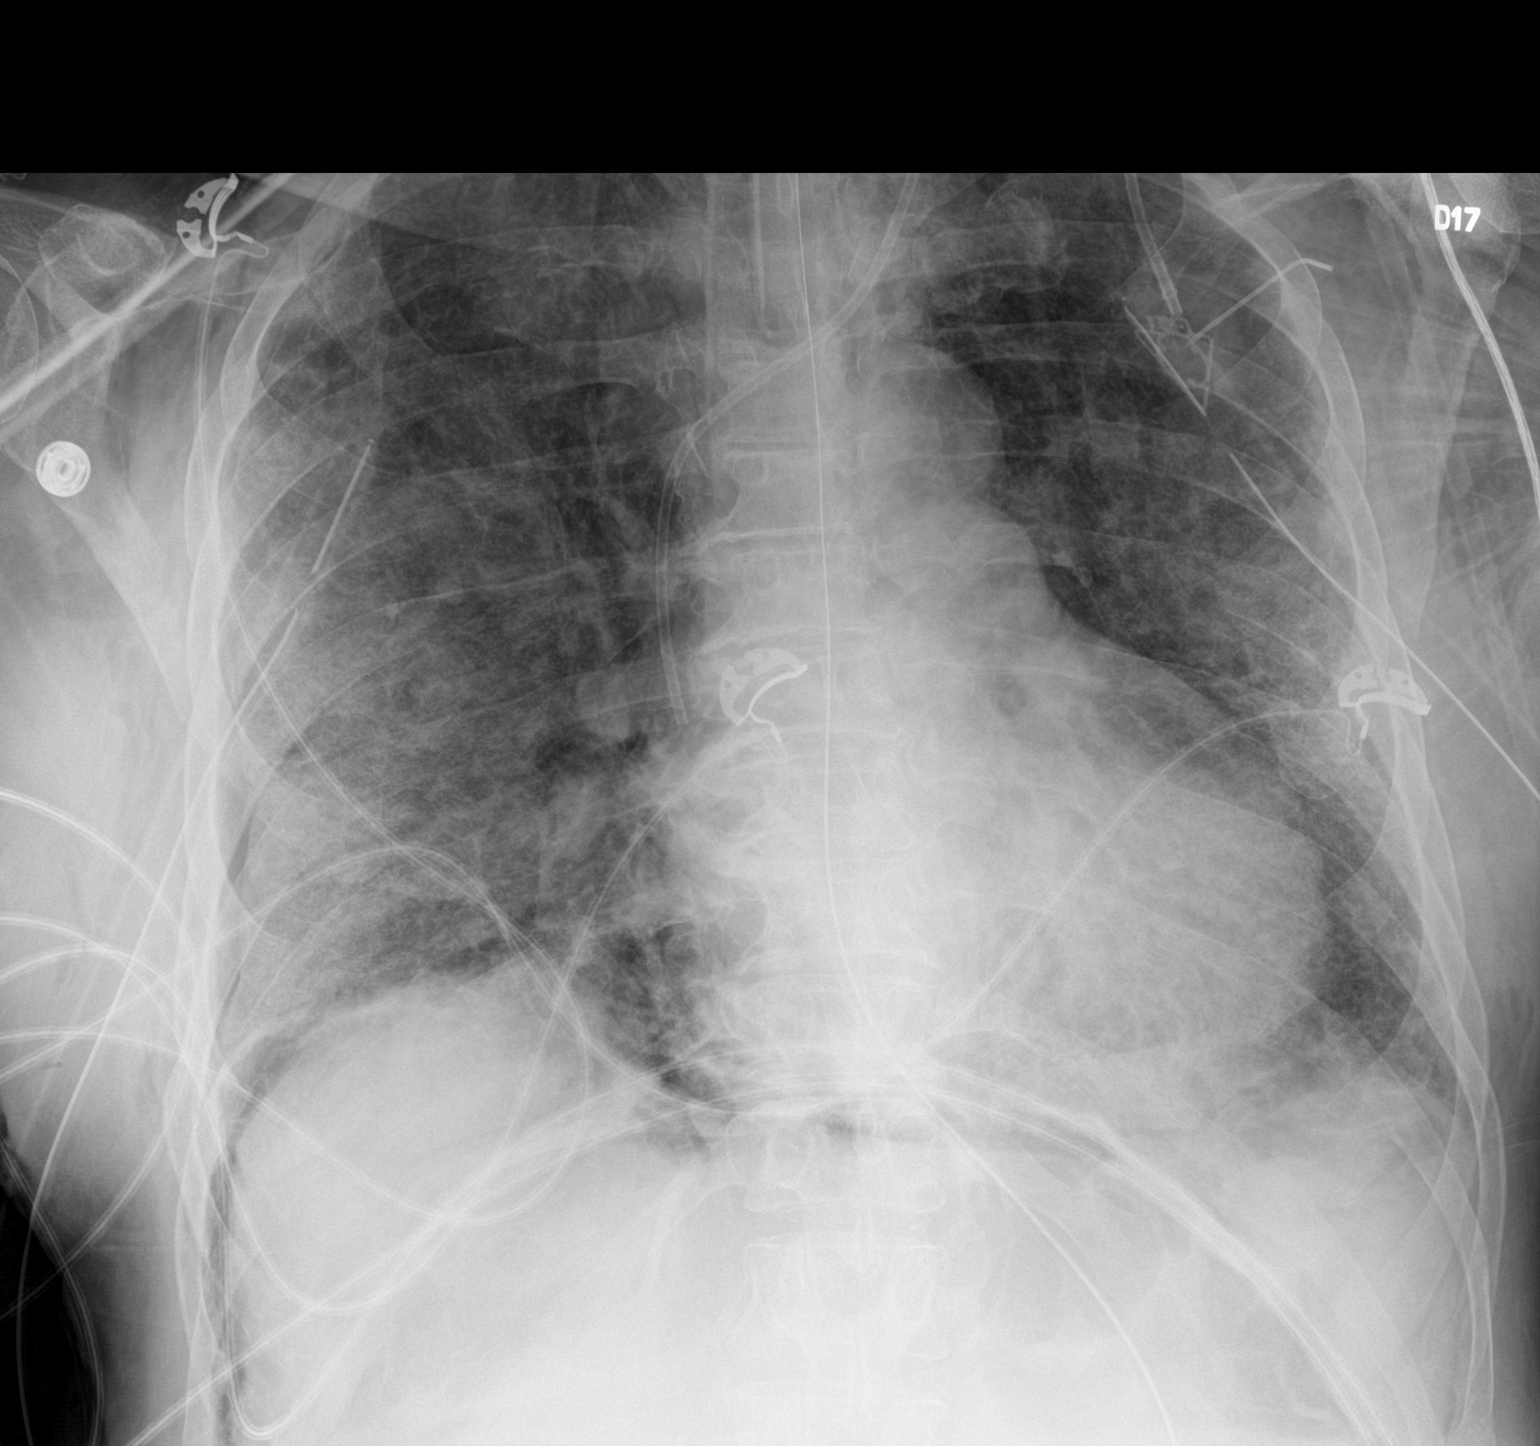

[1 of 1 positions shown; findings below may reference images not displayed]

FINDINGS: Stable bilateral chest tubes. Nasogastric tube coursing below the
diaphragm. Endotracheal tube with the tip 4.2 cm above the carina.
Port-A-Cath with the tip projecting over the SVC.

Small right pneumothorax measuring less than 10%. No left
pneumothorax. Diffuse bilateral interstitial and patchy alveolar
airspace opacities. No pleural effusion. Stable cardiomediastinal
silhouette. No aggressive osseous lesion.
IMPRESSION: 1. Stable bilateral chest tubes. Small right pneumothorax measuring
less than 10%.
2. Diffuse bilateral interstitial and patchy alveolar airspace
opacities. Differential diagnosis includes pulmonary edema versus
ARDS.

## 2022-01-15 IMAGING — DX DG CHEST 1V PORT
1 series · 1 of 1 positions shown · non-contrast
Comparison: 07/30/2019

CLINICAL DATA: Respiratory failure.

EXAM:
PORTABLE CHEST 1 VIEW

[chest ap]
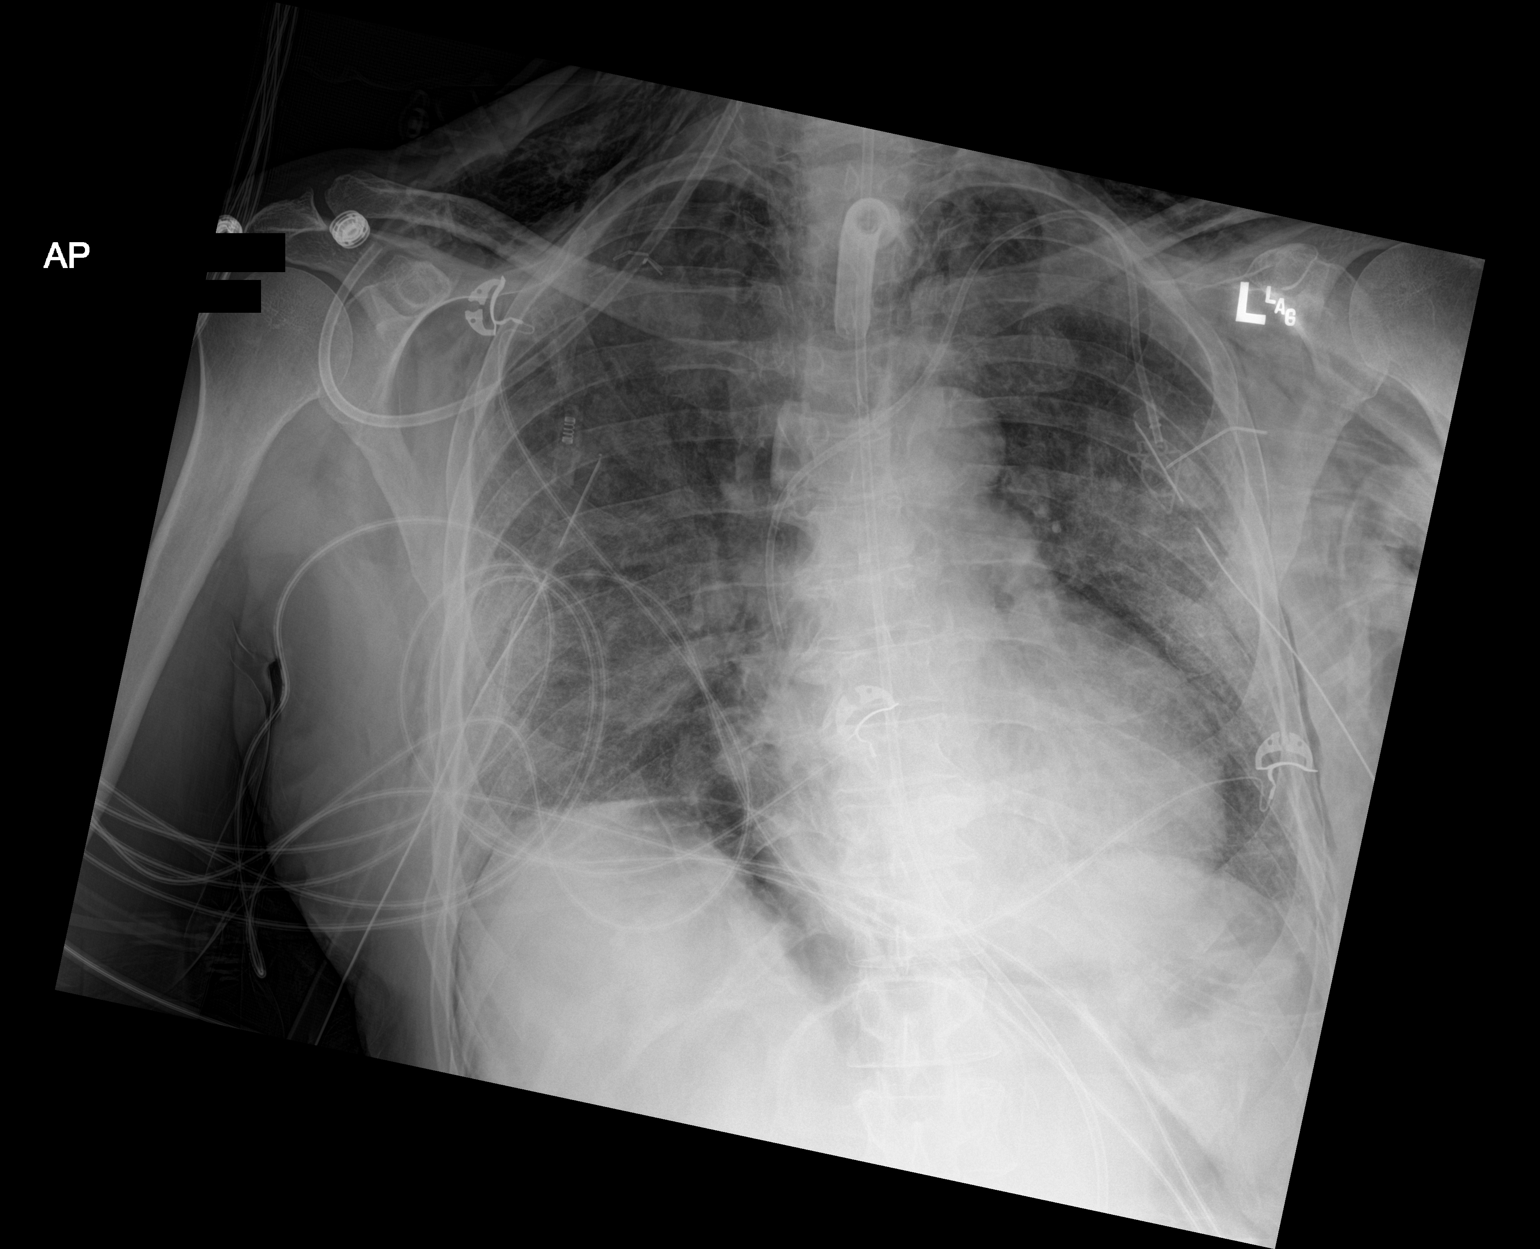

[1 of 1 positions shown; findings below may reference images not displayed]

FINDINGS: The tracheostomy tube and feeding tubes are stable. The left-sided
Port-A-Cath is stable.

Bilateral chest tubes in place without definite pneumothorax. Stable
subcutaneous emphysema.

Persistent interstitial and airspace process in the lungs. No
definite pleural effusion.
IMPRESSION: 1. Stable support apparatus. No pneumothorax. Stable subcutaneous
emphysema.
2. Persistent bilateral interstitial and airspace process.

## 2022-01-16 IMAGING — DX DG CHEST 1V PORT
1 series · 1 of 1 positions shown · non-contrast
Comparison: 07/31/2019 and older exams.

CLINICAL DATA: 63 y/o male, with prior COVID infection [DATE],
admitted with severe acute respiratory failure with hypoxemia in the
setting of receiving chemotherapy for Hodgkin's lymphoma. Currently
has right side pneumothorax.

EXAM:
PORTABLE CHEST 1 VIEW

[chest ap]
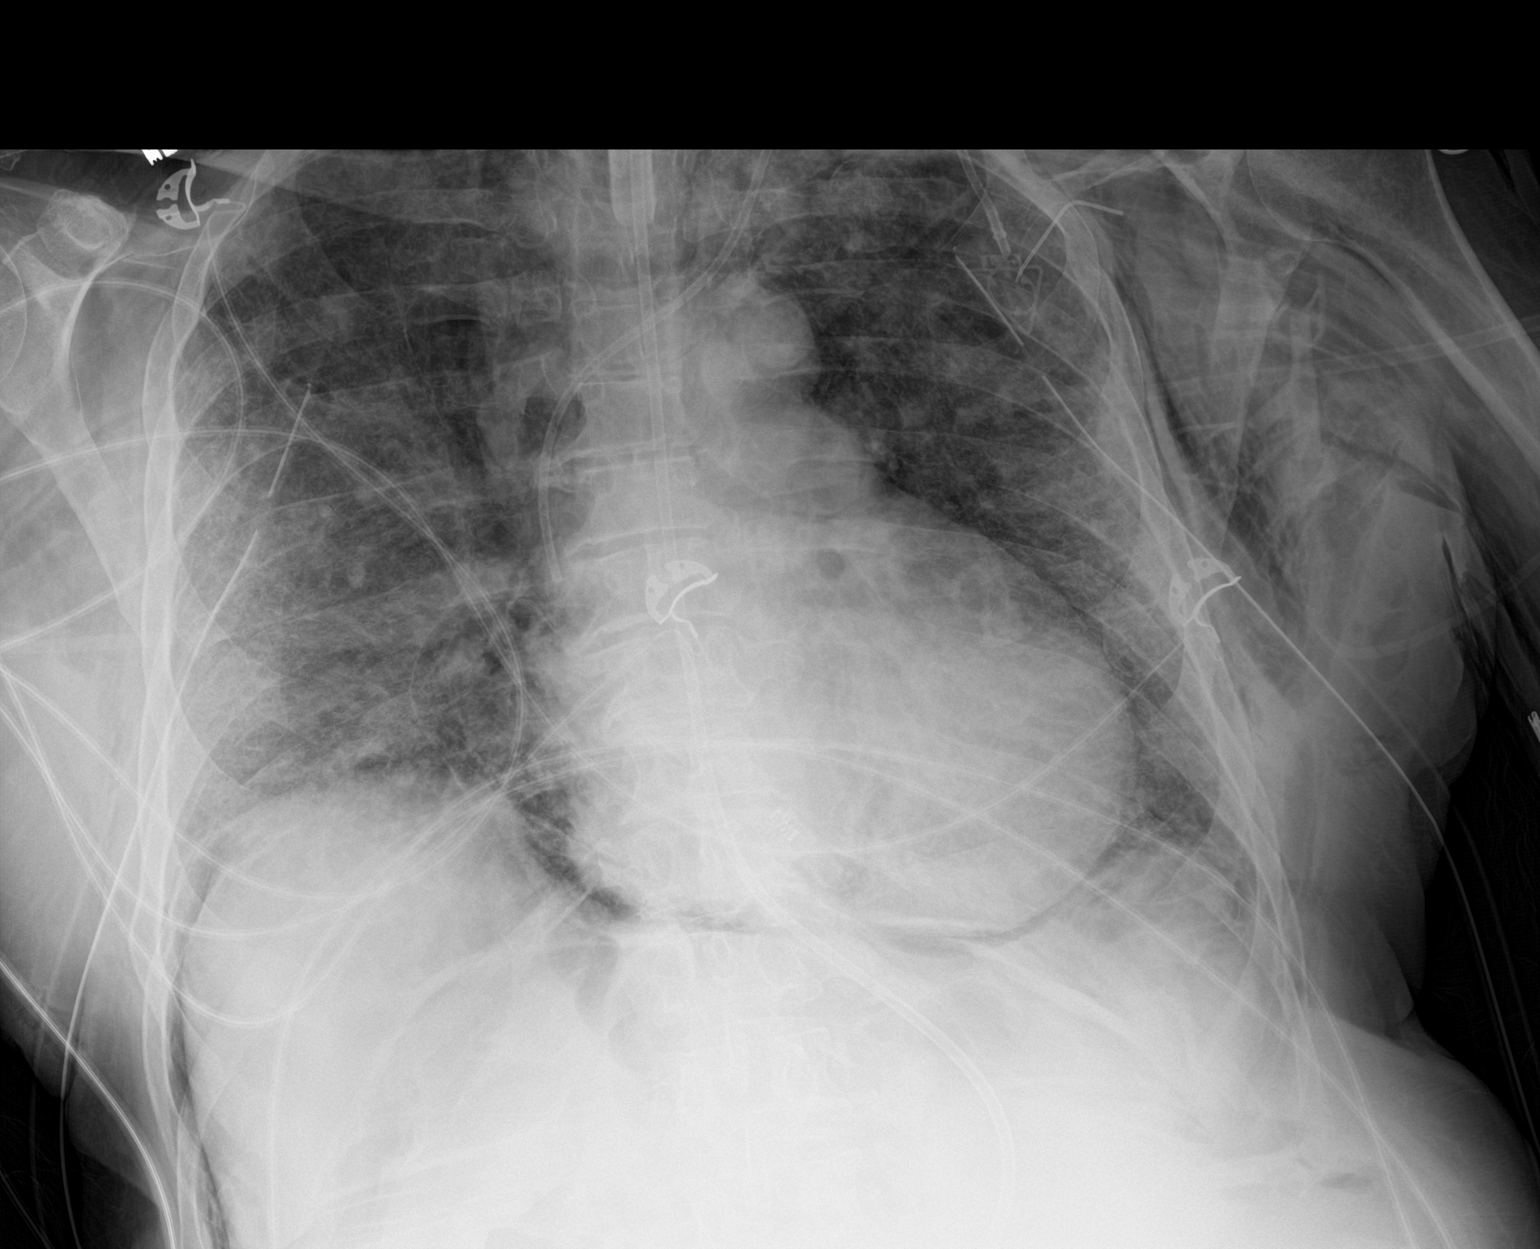

[1 of 1 positions shown; findings below may reference images not displayed]

FINDINGS: Left and right-sided chest tubes, tracheostomy tube, enteric tube
and left anterior chest wall Port-A-Cath are stable.

Extensive subcutaneous emphysema is unchanged from the prior exam.

Air extends along the right lateral aspect of the liver into the
deep right lateral costophrenic sulcus. Suspect a small right
pneumothorax. No evidence a left pneumothorax.

Bilateral interstitial and airspace lung opacities are unchanged
from the prior exam.
IMPRESSION: 1. Probable small right pneumothorax suggested by air tracking along
the lateral margin of the liver shadow in the deep right lateral
costophrenic sulcus.
2. Extensive subcutaneous emphysema in bilateral interstitial and
airspace lung opacities are unchanged from the previous exam. No new
lung abnormalities.
3. Stable support apparatus.

## 2022-01-16 IMAGING — CT CT HEAD W/O CM
3 series · 14 of 47 positions shown, 16 images · non-contrast
Comparison: No pertinent prior studies available for comparison.

CLINICAL DATA: Focal neuro deficit, greater than 6 hours, stroke
suspected. Additional history provided: Respiratory failure, altered
mental status.

EXAM:
CT HEAD WITHOUT CONTRAST
TECHNIQUE: Contiguous axial images were obtained from the base of the skull
through the vertex without intravenous contrast.

[Series 2: head wo · axial · 0.47mm/px · z∈[+1212,+1337]mm · 8 of 31 slices shown, 10 images]
[im 3/31  brain]
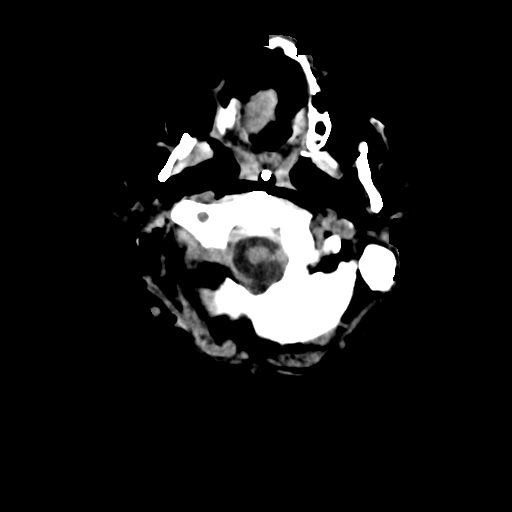
[im 3/31  bone]
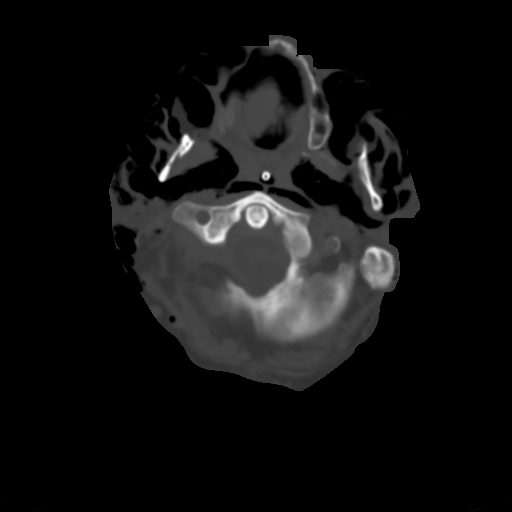
[im 7/31  brain]
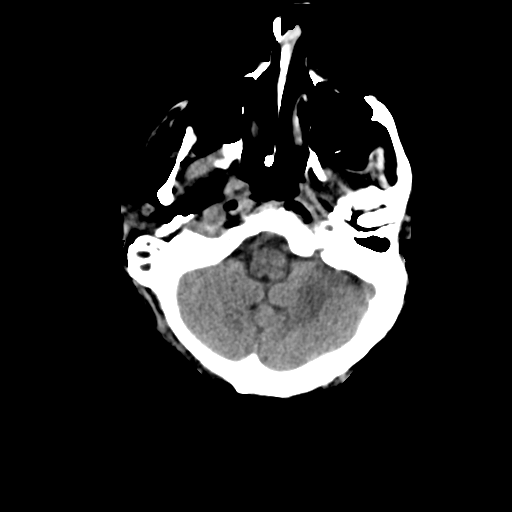
[im 10/31  brain]
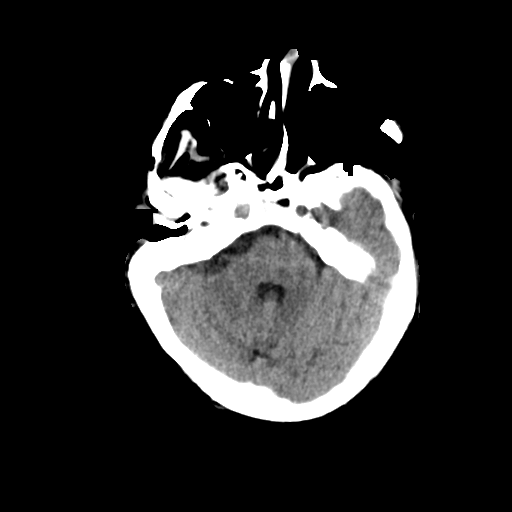
[im 14/31  brain]
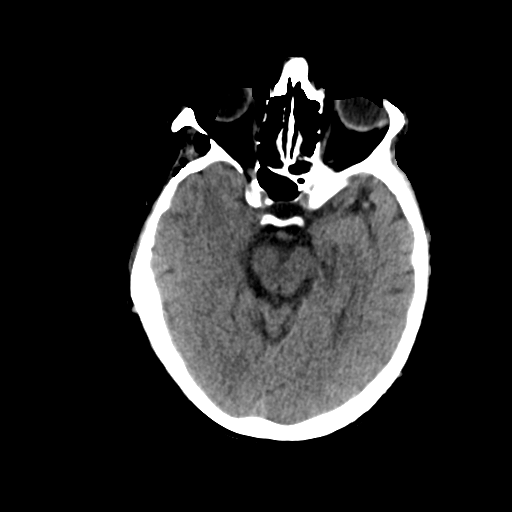
[im 17/31  brain]
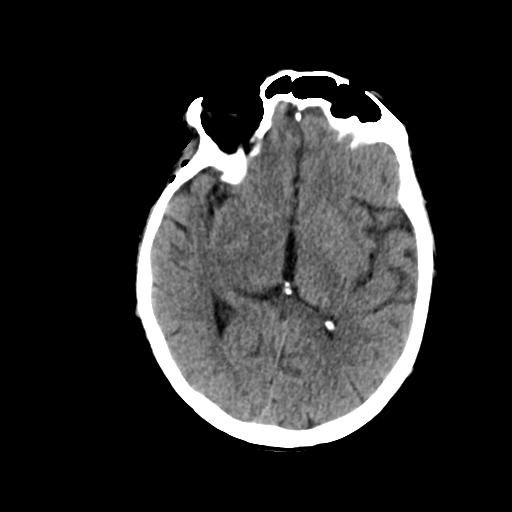
[im 17/31  bone]
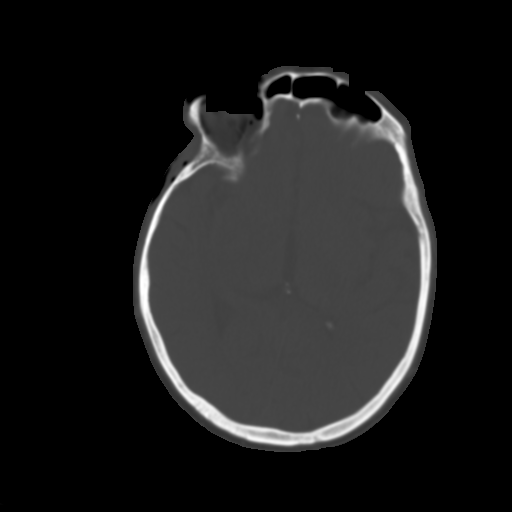
[im 21/31  brain]
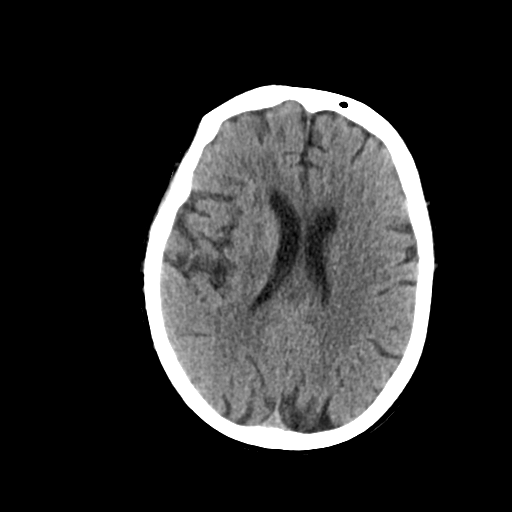
[im 24/31  brain]
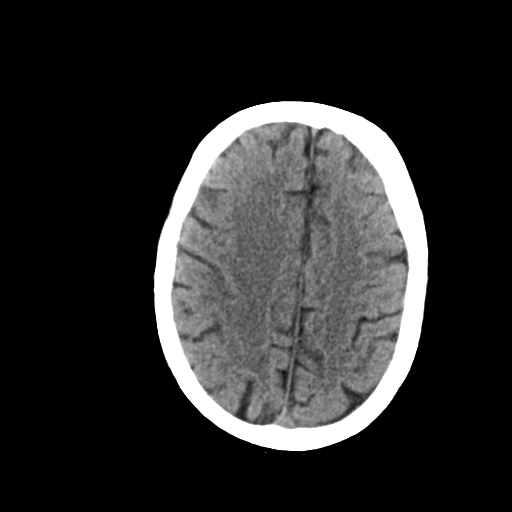
[im 28/31  brain]
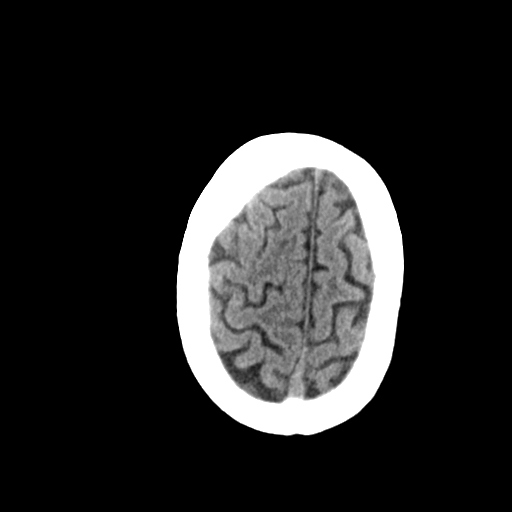

[Series 4: coronal soft tissue · coronal · 0.31mm/px · 3 of 67 slices shown]
[im 23/67  brain]
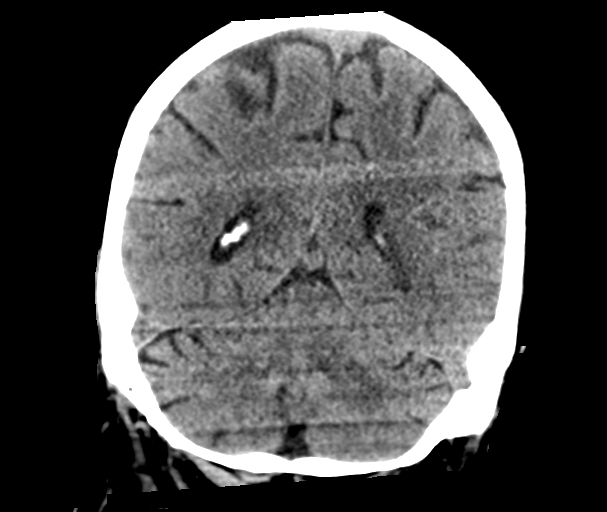
[im 30/67  brain]
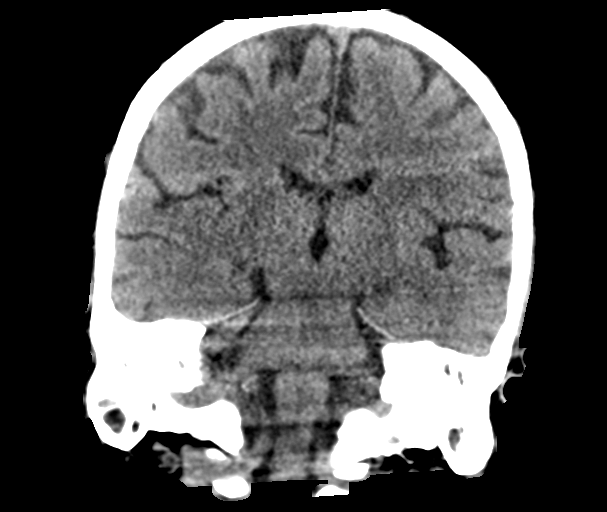
[im 37/67  brain]
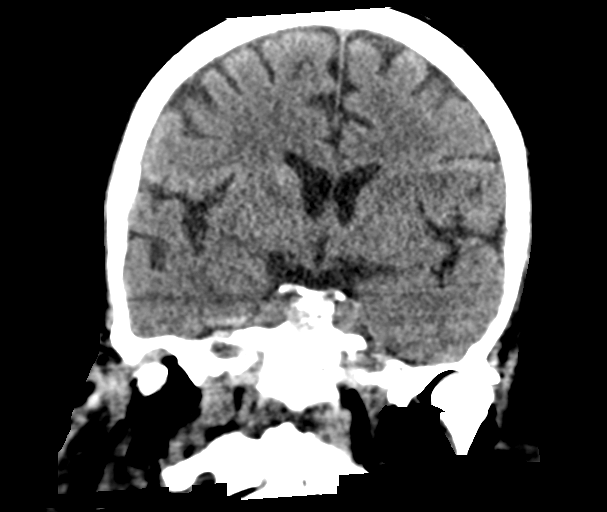

[Series 5: sagittal soft tissue · sagittal · 0.33mm/px · 3 of 67 slices shown]
[im 23/67  brain]
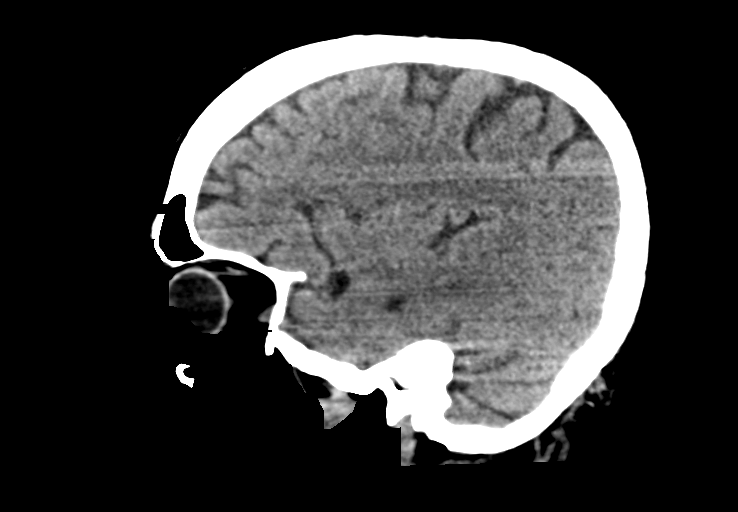
[im 34/67  brain]
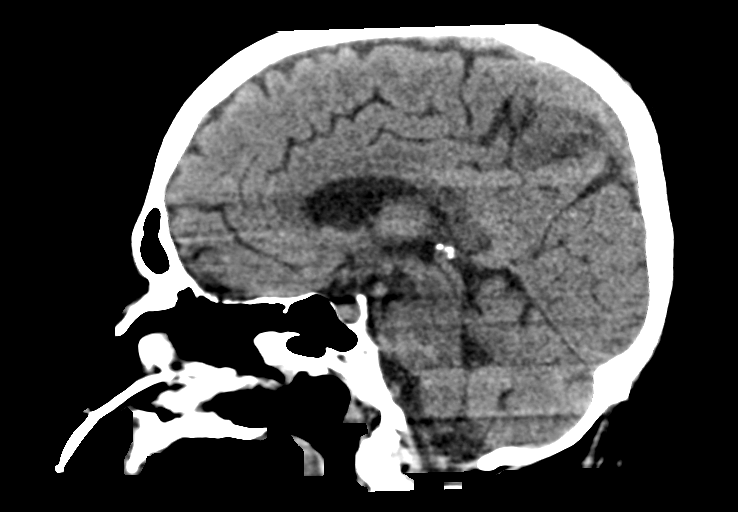
[im 45/67  brain]
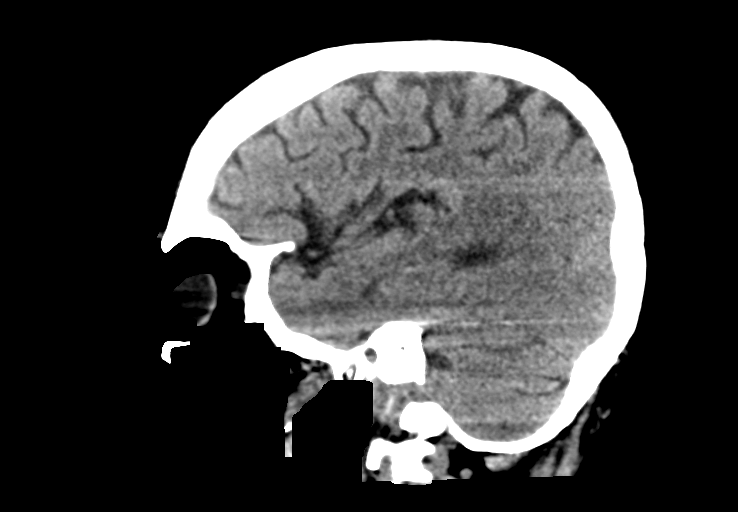

[14 of 47 positions shown; findings below may reference images not displayed]

FINDINGS: Brain: There is no evidence of acute intracranial hemorrhage,
intracranial mass, midline shift or extra-axial fluid collection.No
demarcated cortical infarction.

Vascular: No hyperdense vessel.

Skull: Normal. Negative for fracture or focal lesion.

Sinuses/Orbits: Subcutaneous gas extends into the orbits
bilaterally. Otherwise, no acute orbital abnormality is identified.
There is extensive subcutaneous gas within the visualized upper neck
and this extends into the maxillofacial/periorbital soft tissues. No
significant paranasal sinus disease at the imaged levels. Partially
imaged nasoenteric tube. Large bilateral mastoid effusions
IMPRESSION: 1. No evidence of acute intracranial abnormality.
2. Extensive subcutaneous gas within the partially imaged upper neck
extending into the bilateral orbits and maxillofacial soft tissues.
3. Large bilateral mastoid effusions.

## 2022-01-18 IMAGING — DX DG CHEST 1V PORT
1 series · 1 of 1 positions shown · non-contrast
Comparison: 08/01/2019

CLINICAL DATA: Acute respiratory failure.

EXAM:
PORTABLE CHEST 1 VIEW

[chest ap]
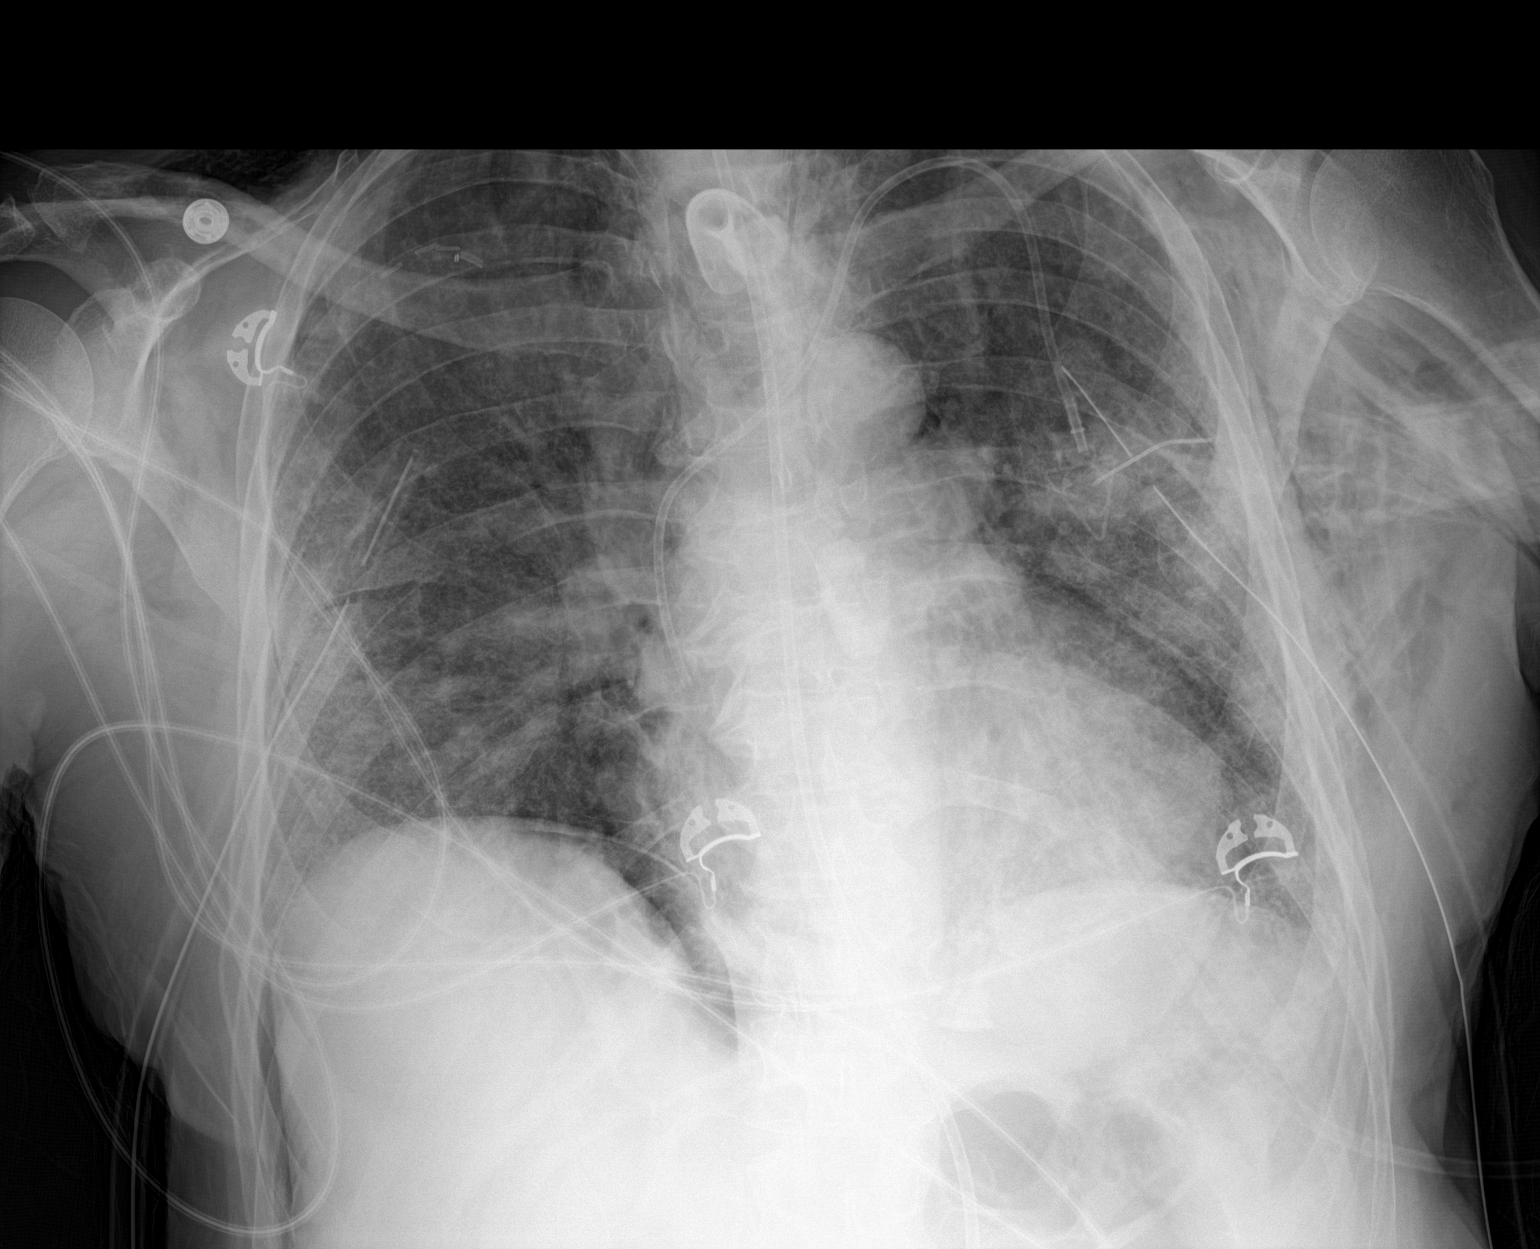

[1 of 1 positions shown; findings below may reference images not displayed]

FINDINGS: Bilateral chest tubes, LEFT Port-A-Cath, tracheostomy tube and
feeding tube remain in place.

Cardiomediastinal contours are stable.

Extensive subcutaneous emphysema is similar to the prior study. Deep
sulcus sign on the RIGHT is redemonstrated.

Suggestion also of pneumomediastinum.

Patchy bilateral interstitial and airspace opacities are unchanged.

Visualized skeletal structures are unremarkable.
IMPRESSION: 1. No interval change in the appearance of the chest.
2. Extensive subcutaneous emphysema, probable RIGHT pneumothorax and
probable pneumomediastinum.
3. Stable support devices.

## 2022-01-20 IMAGING — DX DG CHEST 1V
1 series · 1 of 1 positions shown · non-contrast
Comparison: August 05, 2019 study obtained earlier in the day

CLINICAL DATA: Respiratory failure.

EXAM:
CHEST  1 VIEW

[chest ap]
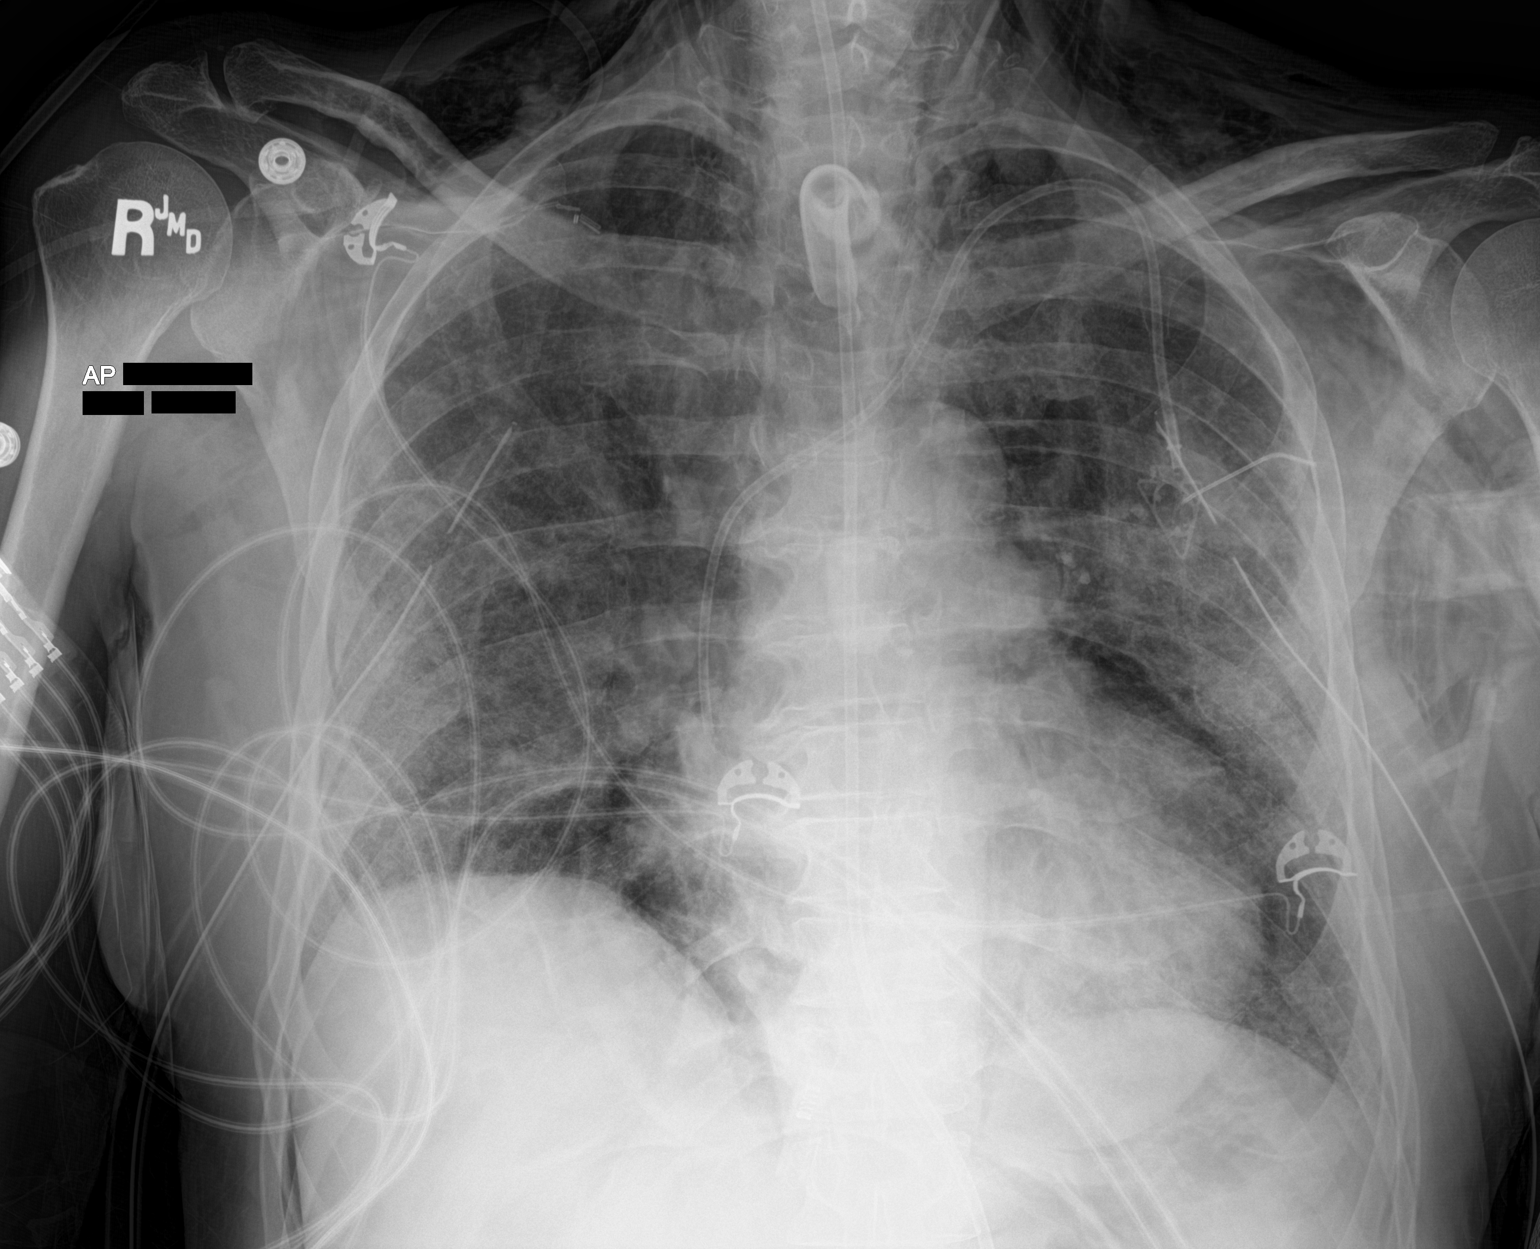

[1 of 1 positions shown; findings below may reference images not displayed]

FINDINGS: Tracheostomy catheter tip is 6.9 cm above the carina. Port-A-Cath
tip is in the superior vena cava. Feeding tube tip is below the
diaphragm. There are chest tubes bilaterally. No pneumothorax is
demonstrable. There is extensive subcutaneous emphysema. Suspect
pneumomediastinum as well, stable.

Ill-defined airspace opacity is again noted, primarily in the mid
lung regions. No new opacity evident. Heart is upper normal in size
with pulmonary vascularity normal. No adenopathy. No bone lesions.
IMPRESSION: Extensive subcutaneous emphysema and a degree of pneumomediastinum.
No pneumothorax evident. Ill-defined hazy airspace opacity in the
mid lung regions bilaterally consistent with either edema or
pneumonia. Both entities may be present concurrently. No new
opacity. Stable cardiac silhouette. Tube and catheter positions do
not appear appreciably changed from earlier in the day.

## 2022-01-20 IMAGING — CT CT CHEST W/O CM
2 of 3 series · 12 of 36 positions shown, 15 images · non-contrast
Comparison: Portable chest earlier today, 08/03/2019. Chest CT
07/08/2019.
COMPARISON: Portable chest earlier today, 08/03/2019. Chest CT
07/08/2019.

Addendum:
CLINICAL DATA: 63-year-old male with [id number], ventilated.
Bilateral chest tubes in place but worsening subcutaneous air in the
face and neck.

EXAM:
CT CHEST WITHOUT CONTRAST
TECHNIQUE: Multidetector CT imaging of the chest was performed following the
standard protocol without IV contrast.

[Series 2: thorax · axial · 0.72mm/px · z∈[+1365,+1629]mm · 9 of 156 slices shown, 12 images]
[im 12/156  mediastinal]
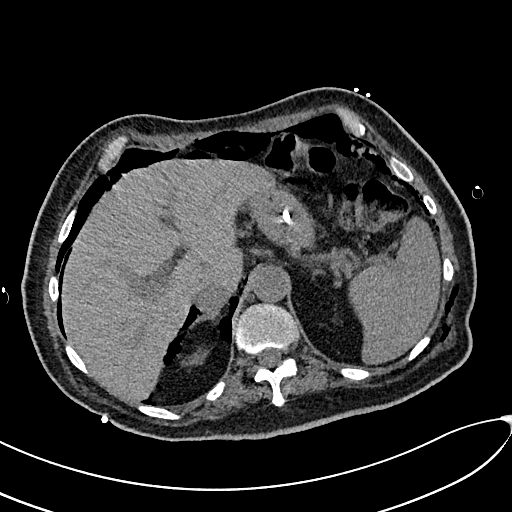
[im 12/156  lung]
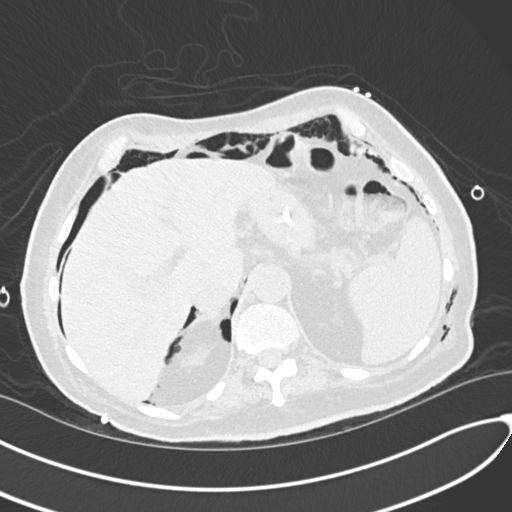
[im 29/156  lung]
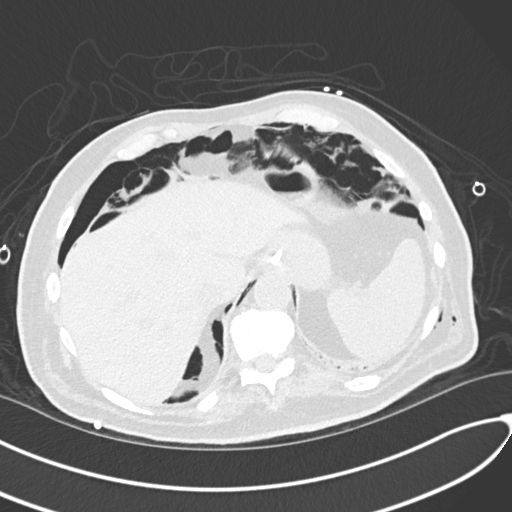
[im 46/156  lung]
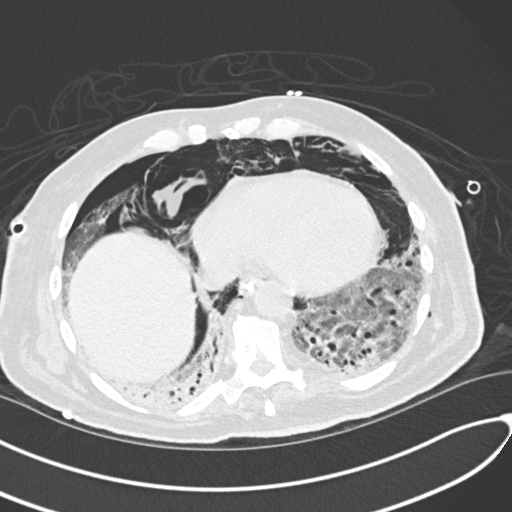
[im 64/156  lung]
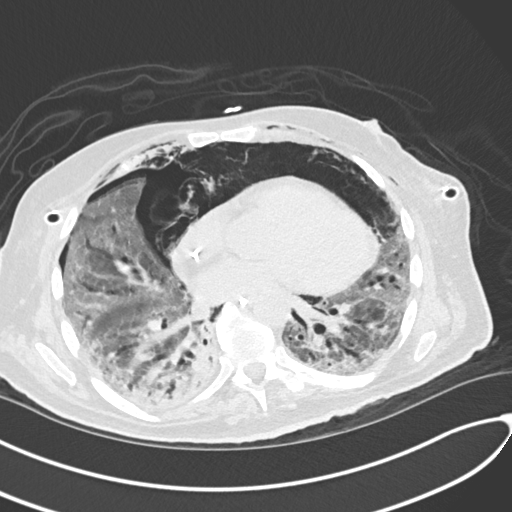
[im 81/156  mediastinal]
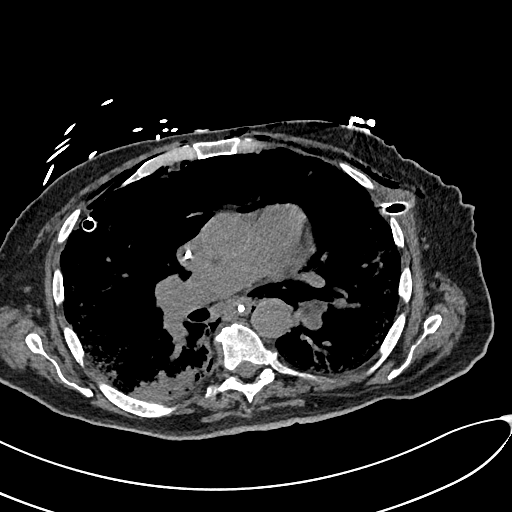
[im 81/156  lung]
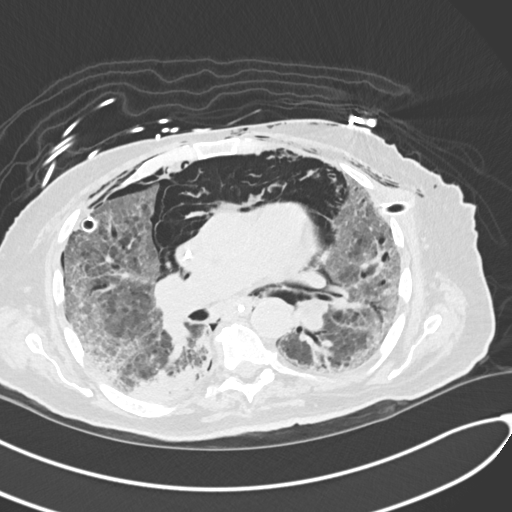
[im 92/156  lung]
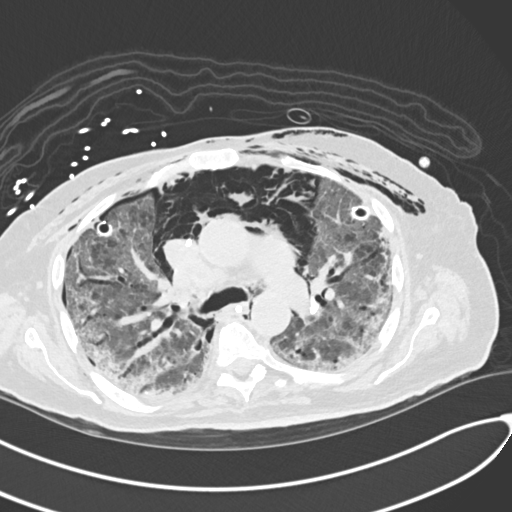
[im 110/156  lung]
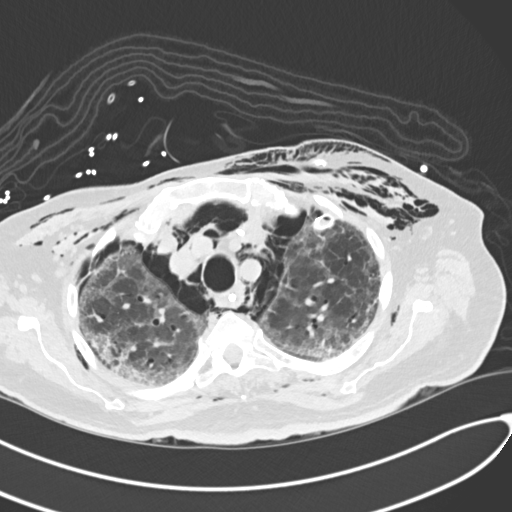
[im 127/156  lung]
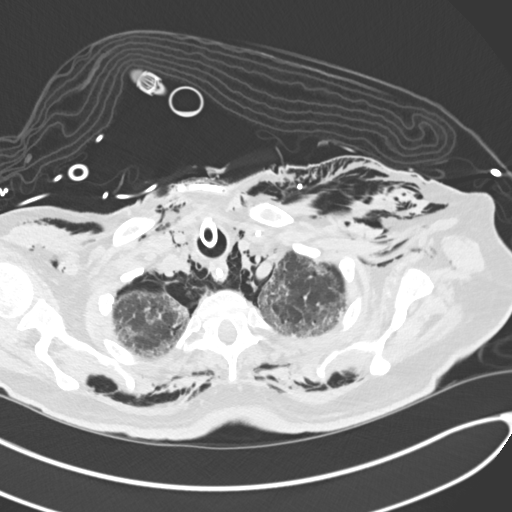
[im 144/156  mediastinal]
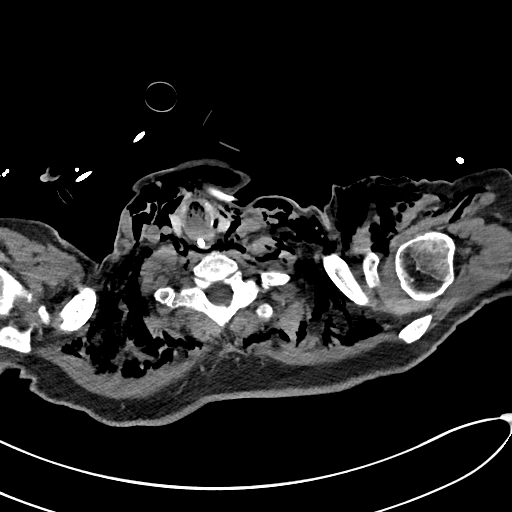
[im 144/156  lung]
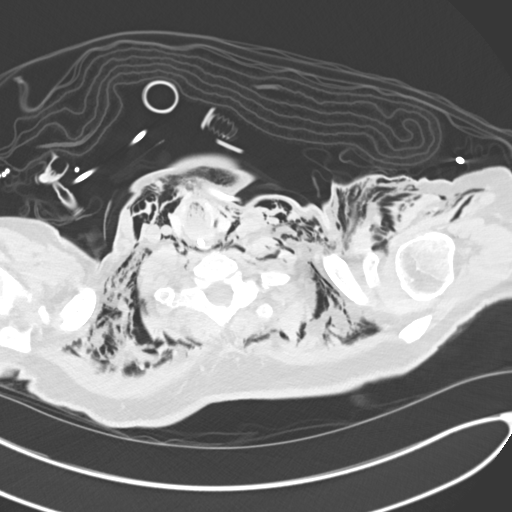

[Series 5: coronal · coronal · 0.61mm/px · 3 of 124 slices shown]
[im 25/124  lung]
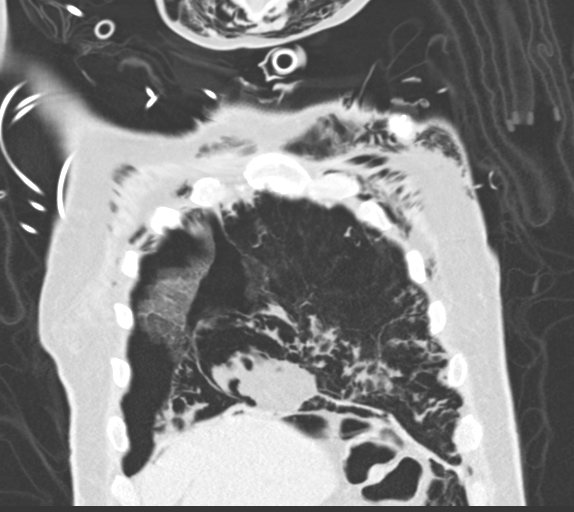
[im 50/124  lung]
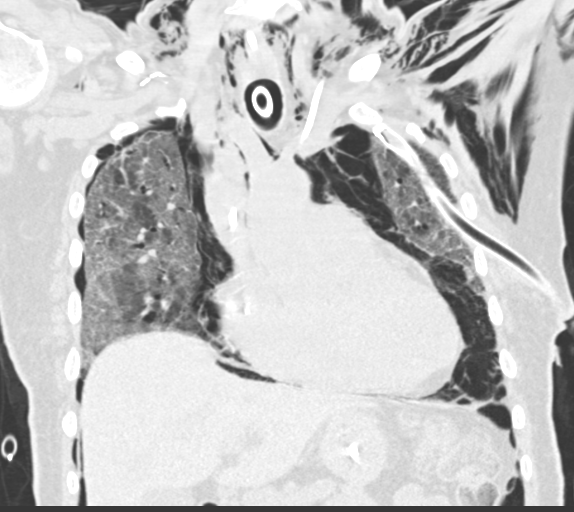
[im 74/124  lung]
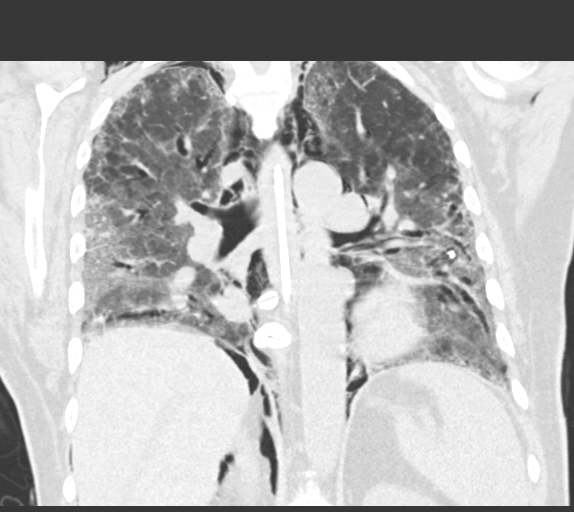

[12 of 36 positions shown; findings below may reference images not displayed]

FINDINGS: Cardiovascular: Large volume new pneumomediastinum. There is a
degree of associated cardiac contour deformity, primarily the right
heart (series 2, image 106 today versus series 4, image 63 last
month). No pericardial effusion. Vascular patency is not evaluated
in the absence of IV contrast.

Mediastinum/Nodes: Pneumomediastinum, contiguous with the deep space
neck and face gas at the thoracic inlet. No mediastinal
lymphadenopathy. There are small calcified left hilar lymph nodes
again noted. An enteric tube courses through the esophagus and into
the visible stomach.

Lungs/Pleura: Tracheostomy tube in place with no adverse features.
Major airways remain patent, peripheral lung bronchiectasis is news
from last month. Diffuse bilateral lung opacity ranging from
ground-glass to peripheral more reticulonodular opacity, and right
lower lobe consolidation.

There are bilateral chest tubes in place, and the right chest tube
appears to be within the pleural space anteriorly. There is a
superimposed small to moderate volume right pneumothorax which is
most apparent at the level of the inferior hilum and costophrenic
sulcus. On the left the chest tube is partially opacified in the
lung apex (series 7, image 47) but there is no left pneumothorax.
Both chest tubes course anteriorly toward the lung apices.

No superimposed pleural effusion.

Upper Abdomen: The the scan does not extend below the entire level
of the diaphragm, but no definite pneumoperitoneum is identified.
However, there is a small volume of retroperitoneal gas on the right
around the IVC and kidney (series 2, image [id number]). Negative visible
noncontrast liver, gallbladder, spleen, pancreas, adrenal glands,
kidneys and bowel.

Musculoskeletal: Large volume of subcutaneous gas in the visible
bilateral lower neck, supraclavicular regions, and wrapping
posteriorly around the shoulders and upper back.

No acute osseous abnormality identified.
IMPRESSION: 1. Tension Pneumomediastinum: large new volume of pneumomediastinum
since a chest CT 07/08/2019, deforming the right heart contour. No
pericardial effusion
Mediastinal gas appears contiguous with extensive subcutaneous
emphysema in the visible neck, and also with a small volume of
retroperitoneal gas in the right upper abdomen.

2. Bilateral chest tubes in place course into the lung apices.
Superimposed small to moderate volume of right pneumothorax, most
apparent along the medial lung and in the costophrenic angle. No
pleural effusion.

3. Continued widespread pulmonary opacity with development of some
bronchiectasis since [REDACTED], but mildly improved ventilation overall.
There is some consolidation in the right lower lobe.

ADDENDUM:
Study discussed by telephone with NP DANII AUJLA on 08/05/2019 at
2721 hours.

*** End of Addendum ***
FINDINGS: Cardiovascular: Large volume new pneumomediastinum. There is a
degree of associated cardiac contour deformity, primarily the right
heart (series 2, image 106 today versus series 4, image 63 last
month). No pericardial effusion. Vascular patency is not evaluated
in the absence of IV contrast.

Mediastinum/Nodes: Pneumomediastinum, contiguous with the deep space
neck and face gas at the thoracic inlet. No mediastinal
lymphadenopathy. There are small calcified left hilar lymph nodes
again noted. An enteric tube courses through the esophagus and into
the visible stomach.

Lungs/Pleura: Tracheostomy tube in place with no adverse features.
Major airways remain patent, peripheral lung bronchiectasis is news
from last month. Diffuse bilateral lung opacity ranging from
ground-glass to peripheral more reticulonodular opacity, and right
lower lobe consolidation.

There are bilateral chest tubes in place, and the right chest tube
appears to be within the pleural space anteriorly. There is a
superimposed small to moderate volume right pneumothorax which is
most apparent at the level of the inferior hilum and costophrenic
sulcus. On the left the chest tube is partially opacified in the
lung apex (series 7, image 47) but there is no left pneumothorax.
Both chest tubes course anteriorly toward the lung apices.

No superimposed pleural effusion.

Upper Abdomen: The the scan does not extend below the entire level
of the diaphragm, but no definite pneumoperitoneum is identified.
However, there is a small volume of retroperitoneal gas on the right
around the IVC and kidney (series 2, image [id number]). Negative visible
noncontrast liver, gallbladder, spleen, pancreas, adrenal glands,
kidneys and bowel.

Musculoskeletal: Large volume of subcutaneous gas in the visible
bilateral lower neck, supraclavicular regions, and wrapping
posteriorly around the shoulders and upper back.

No acute osseous abnormality identified.
IMPRESSION: 1. Tension Pneumomediastinum: large new volume of pneumomediastinum
since a chest CT 07/08/2019, deforming the right heart contour. No
pericardial effusion
Mediastinal gas appears contiguous with extensive subcutaneous
emphysema in the visible neck, and also with a small volume of
retroperitoneal gas in the right upper abdomen.

2. Bilateral chest tubes in place course into the lung apices.
Superimposed small to moderate volume of right pneumothorax, most
apparent along the medial lung and in the costophrenic angle. No
pleural effusion.

3. Continued widespread pulmonary opacity with development of some
bronchiectasis since [REDACTED], but mildly improved ventilation overall.
There is some consolidation in the right lower lobe.

## 2022-01-21 IMAGING — DX DG CHEST 1V PORT
1 series · 1 of 1 positions shown · non-contrast
Comparison: Chest x-rays dated 08/05/2019. Chest CT dated
08/05/2019.

CLINICAL DATA: Pneumomediastinum.

EXAM:
PORTABLE CHEST 1 VIEW

[chest ap]
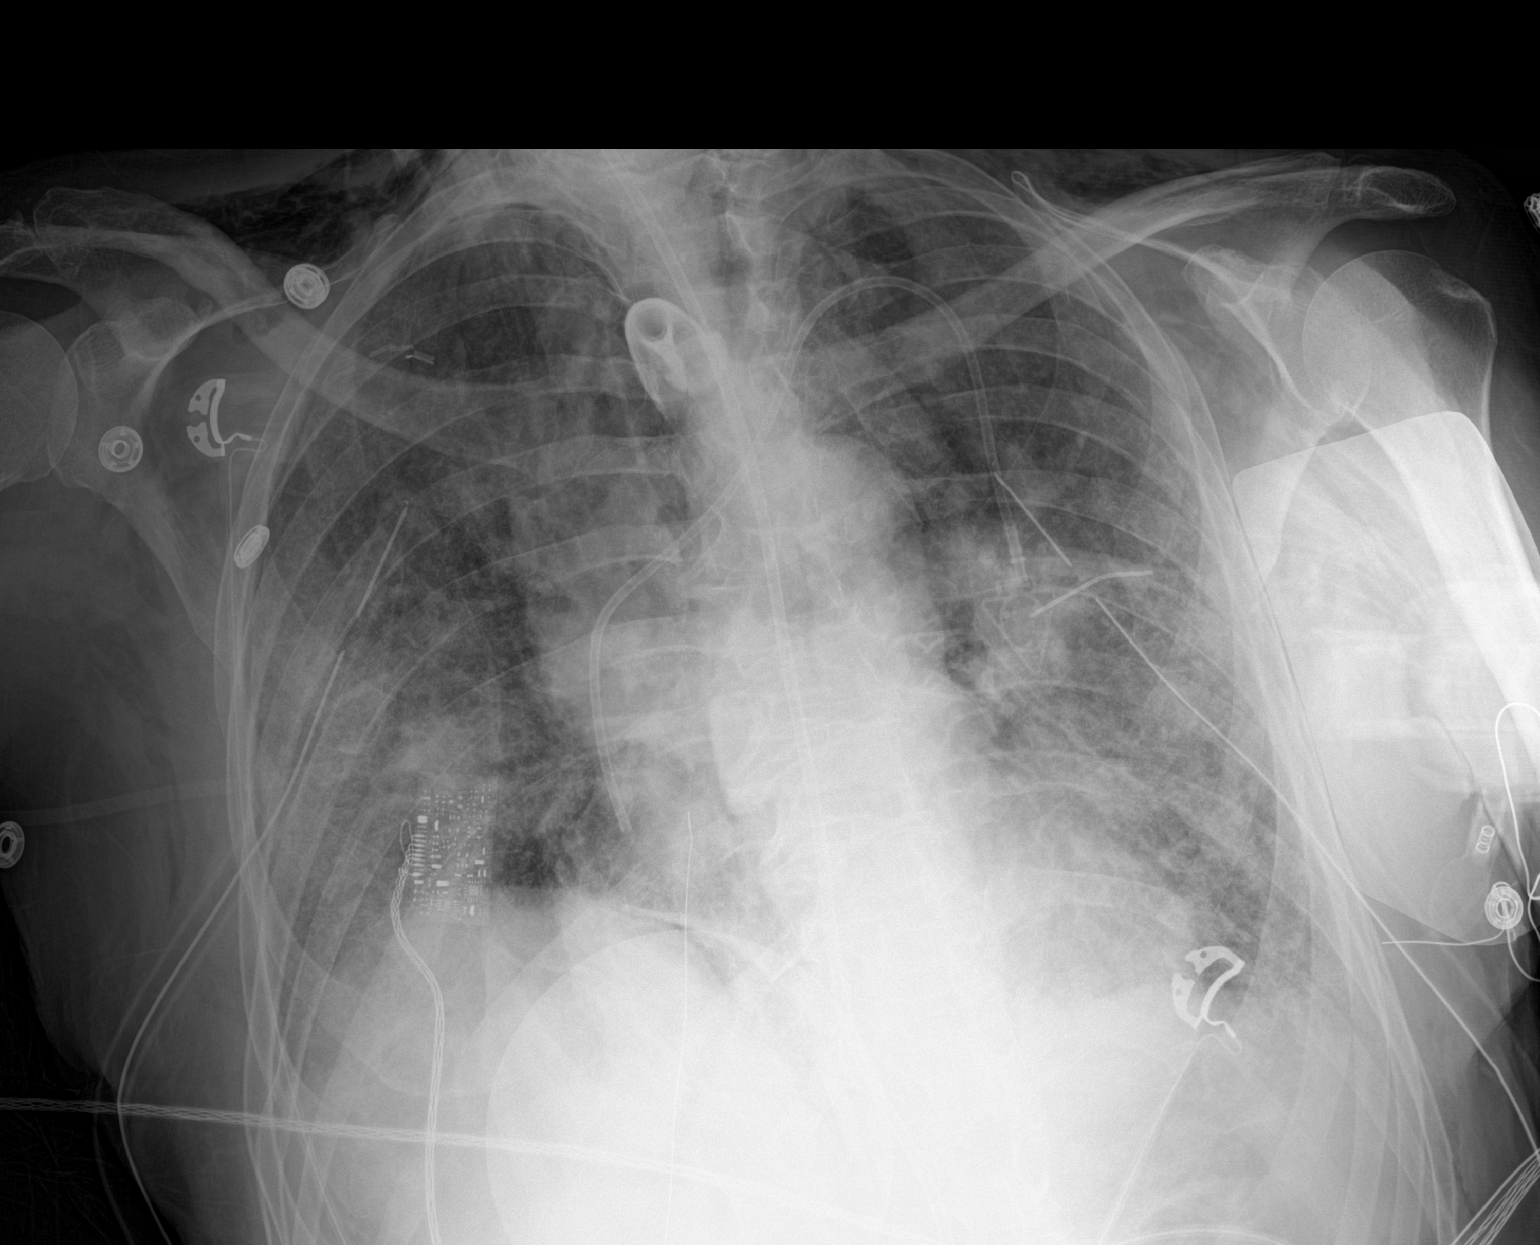

[1 of 1 positions shown; findings below may reference images not displayed]

FINDINGS: Heart size is grossly stable. Tracheostomy tube in place. Bilateral
chest tubes appear stable in position. Enteric tube passes below the
diaphragm. LEFT-sided Port-A-Cath appears stable in position with
tip at the level of the RIGHT atrium.

Persistent extensive subcutaneous emphysema, associated with the
pneumomediastinum better demonstrated on earlier chest CT. Diffuse
airspace opacities are unchanged in the short-term interval.
Probable small RIGHT-sided pneumothorax.
IMPRESSION: 1. Stable chest x-ray. Persistent extensive subcutaneous emphysema,
associated with the pneumomediastinum better demonstrated on earlier
chest CT.
2. Stable bilateral airspace opacities, likely multifocal pneumonia.
3. Probable small RIGHT-sided pneumothorax, better demonstrated on
earlier chest CT. Bilateral chest tubes in place.

## 2022-01-22 IMAGING — DX DG CHEST 1V PORT
1 series · 1 of 1 positions shown · non-contrast
Comparison: 08/06/2019

CLINICAL DATA: Respiratory failure.

EXAM:
PORTABLE CHEST 1 VIEW

[chest ap]
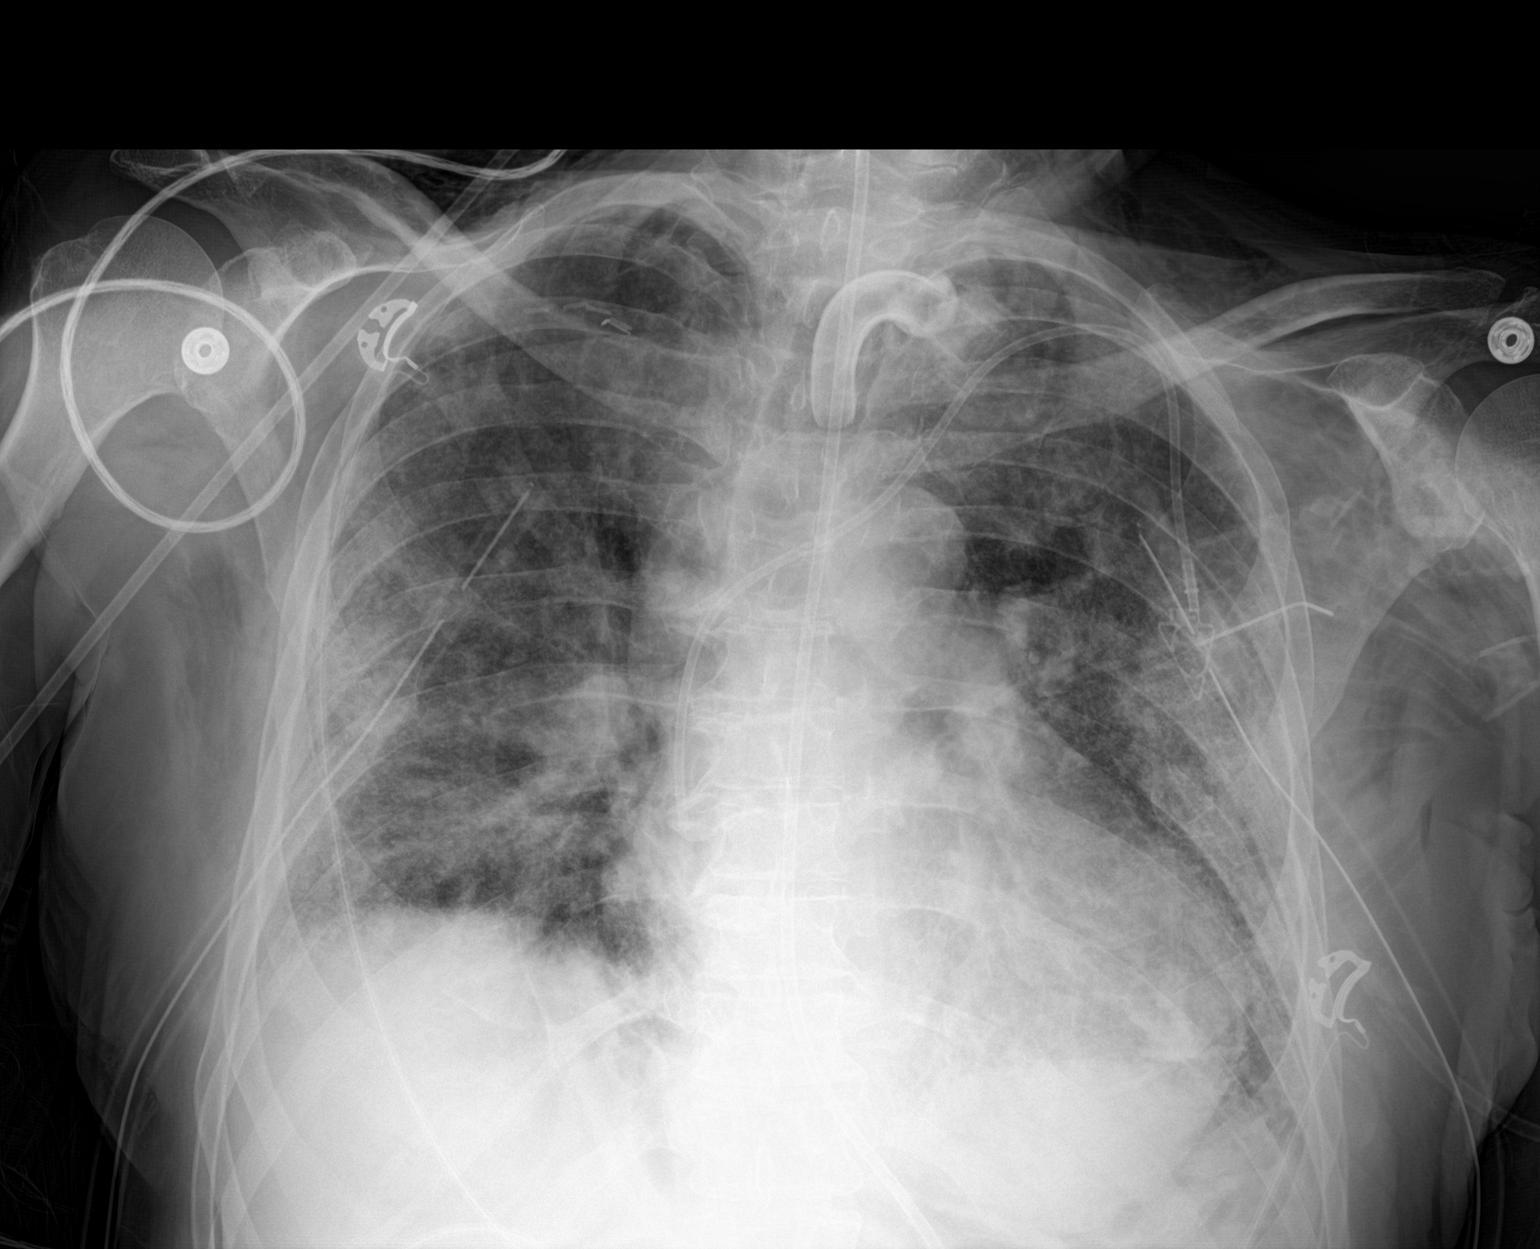

[1 of 1 positions shown; findings below may reference images not displayed]

FINDINGS: Tracheostomy tube tip is above the carina. There is a left chest
wall port a catheter with tip in the cavoatrial junction. Feeding
tube tip is below the GE junction. Bilateral chest tubes are in
place. No visible pneumothorax identified at this time. Stable
cardiac silhouette. Bilateral interstitial and airspace densities
are not significantly changed from the previous exam. Subcutaneous
emphysema within the chest wall and bilateral supraclavicular
regions stable.
IMPRESSION: 1. No change in aeration to the lungs compared with previous exam.
2. Stable support apparatus.
3. No pneumothorax identified.

## 2022-01-23 IMAGING — DX DG CHEST 1V PORT
1 series · 1 of 1 positions shown · non-contrast
Comparison: 08/08/2019 earlier

CLINICAL DATA: Evaluate for pneumothorax.

EXAM:
PORTABLE CHEST 1 VIEW

[chest ap]
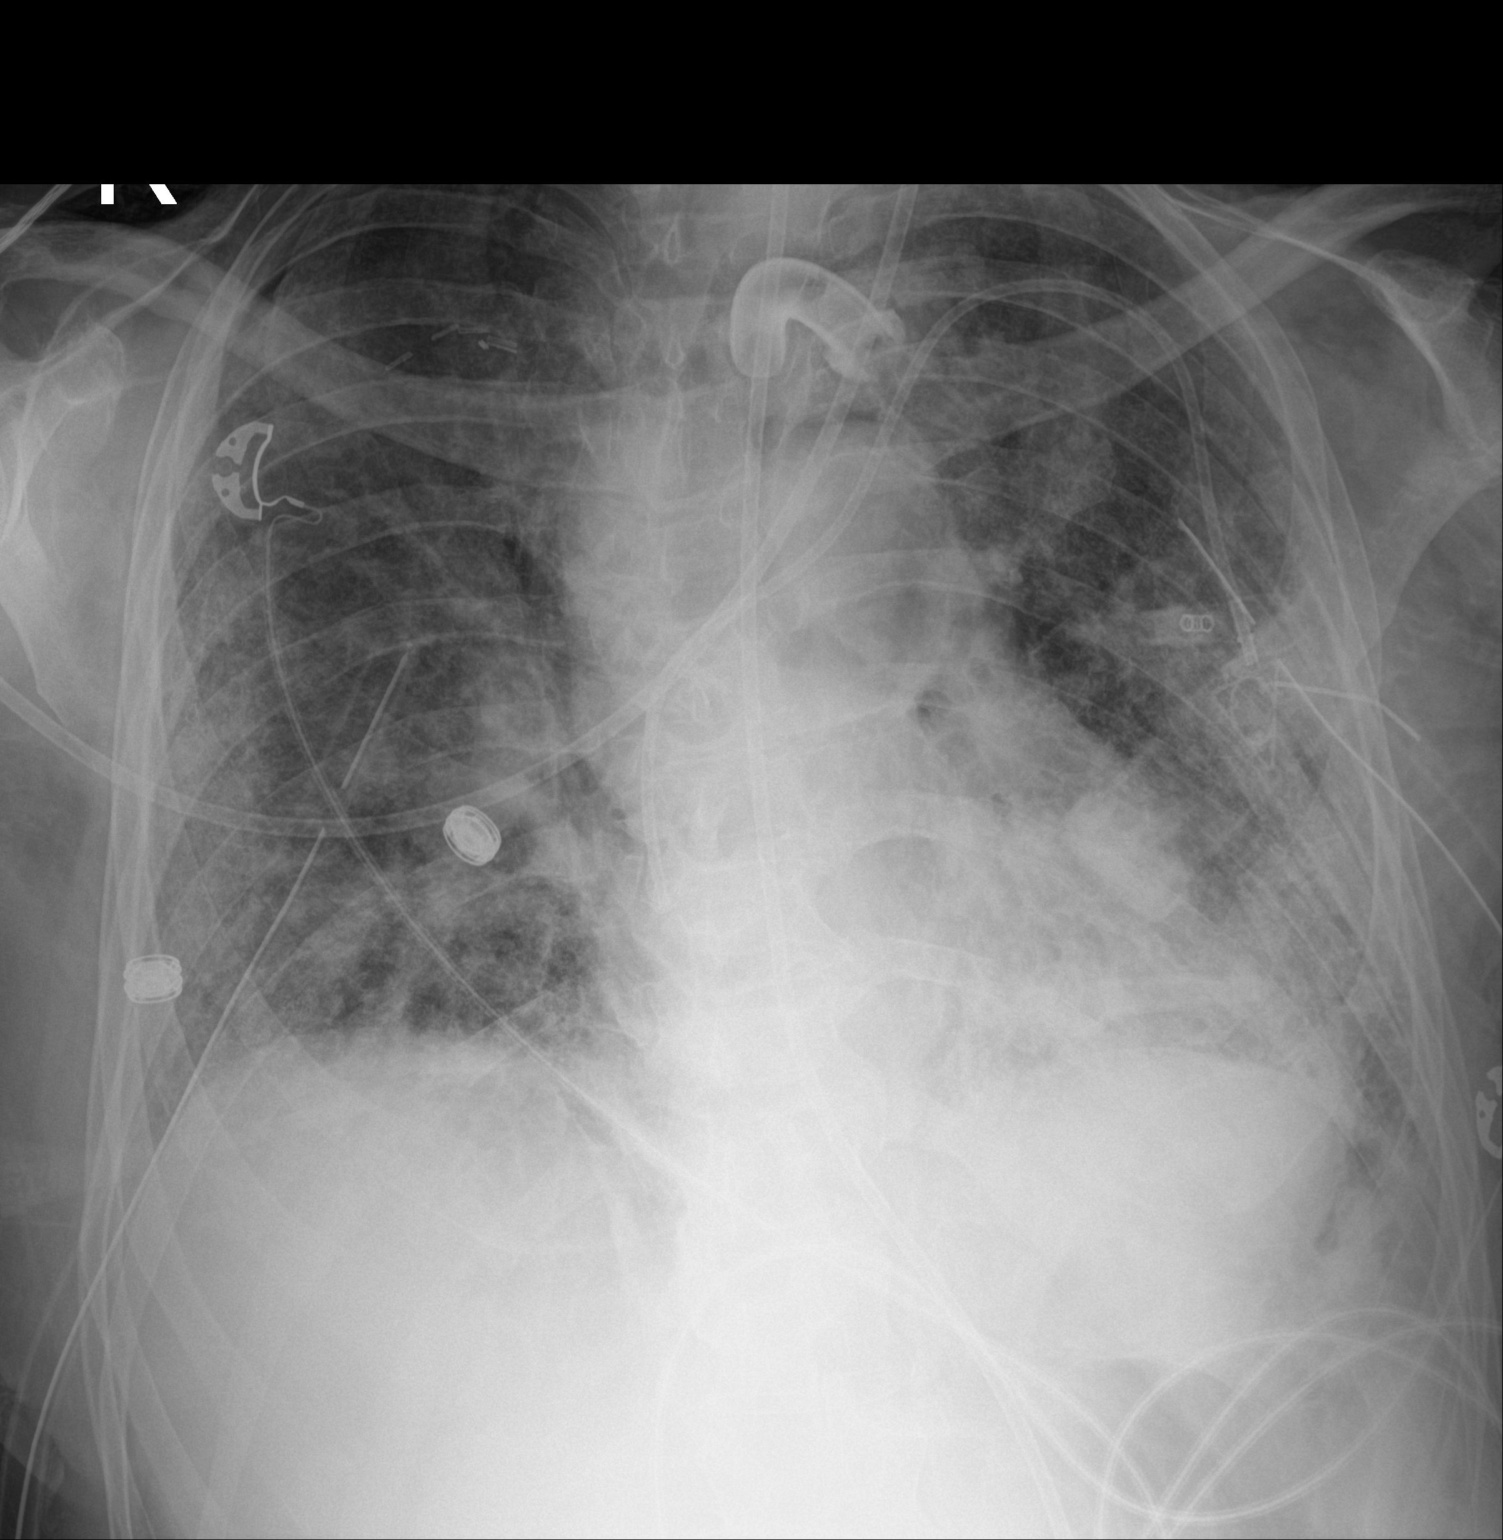

[1 of 1 positions shown; findings below may reference images not displayed]

FINDINGS: Patient is slightly rotated to the left. Left-sided Port-A-Cath,
tracheostomy tube and enteric tube unchanged. Bilateral chest tubes
unchanged.

Lungs are adequately inflated demonstrate stable bilateral hazy
multifocal airspace process. Stable tiny right apical pneumothorax.
Cardiomediastinal silhouette and remainder of the exam is unchanged.
IMPRESSION: 1.  Stable hazy bilateral multifocal airspace process.

2.  Stable tiny right apical pneumothorax.

3.  Tubes and lines as described.

## 2022-01-23 IMAGING — DX DG CHEST 1V PORT
1 series · 1 of 1 positions shown · non-contrast
Comparison: August 07, 2019.

CLINICAL DATA: Respiratory failure.

EXAM:
PORTABLE CHEST 1 VIEW

[chest ap]
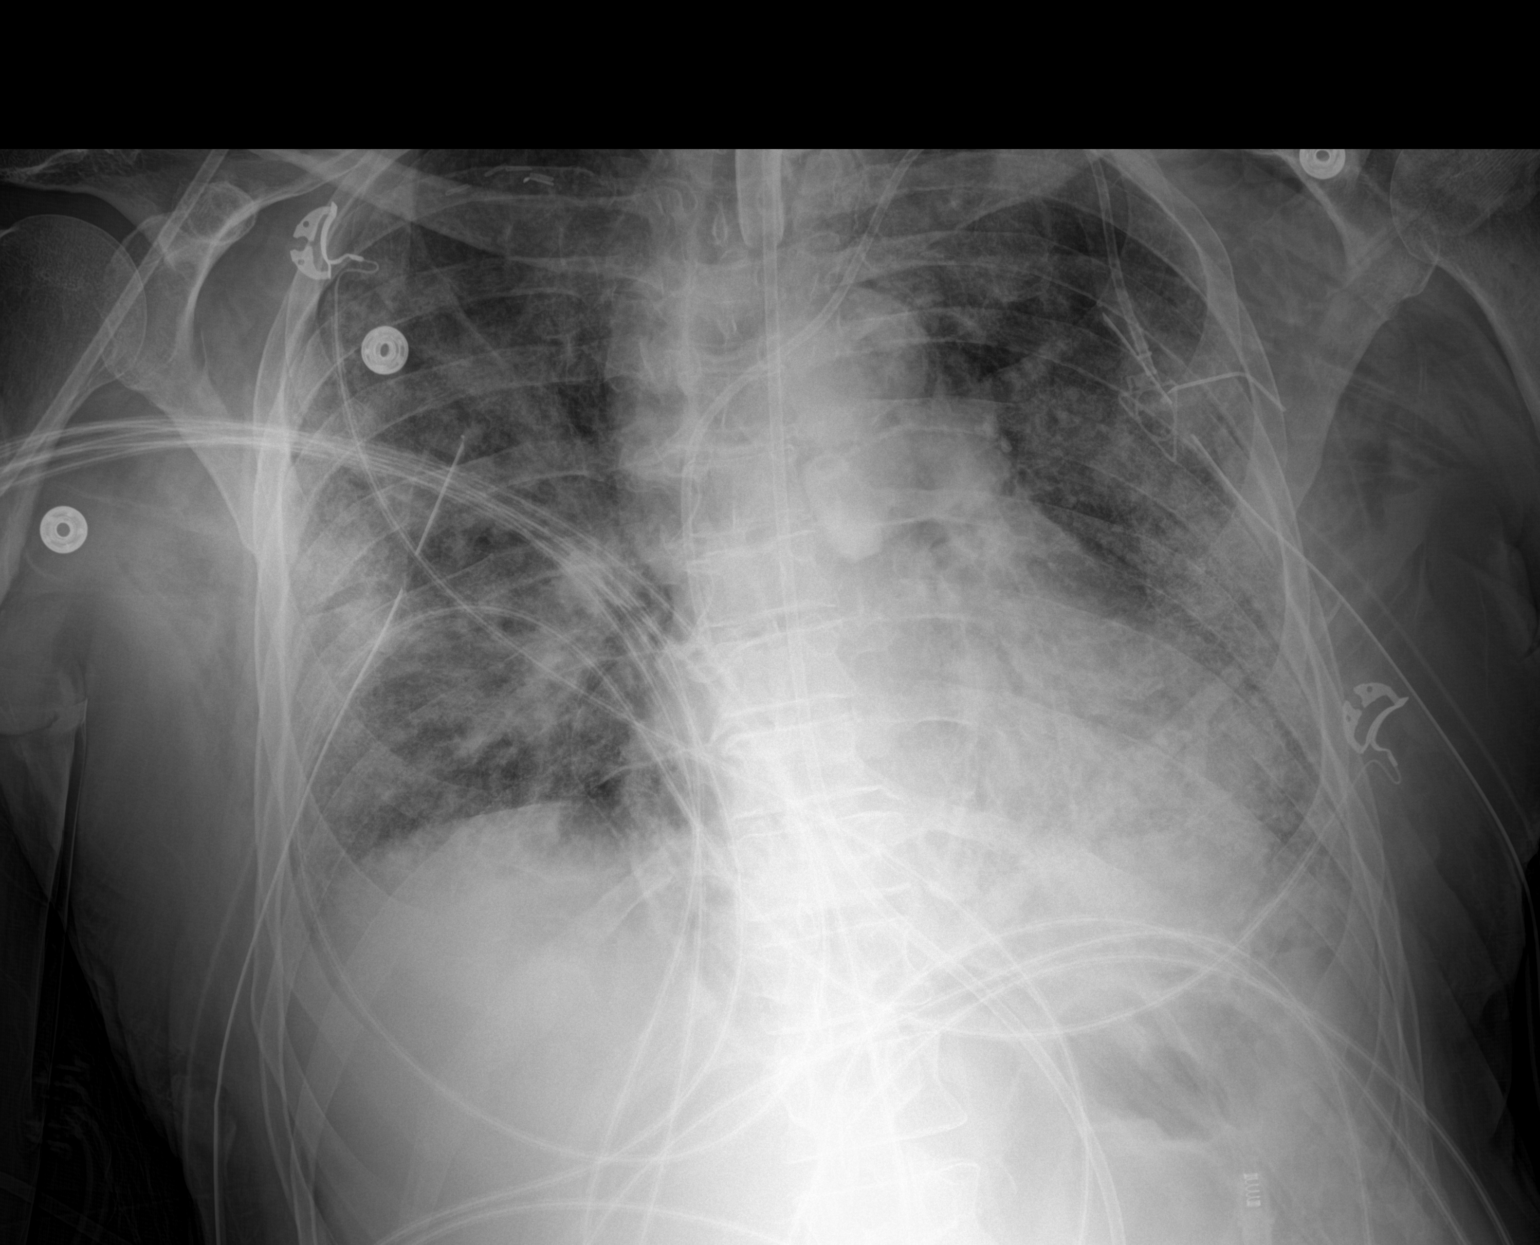

[1 of 1 positions shown; findings below may reference images not displayed]

FINDINGS: Stable cardiomediastinal silhouette. Tracheostomy tube is in grossly
good position. Feeding tube is seen entering stomach. Left-sided
Port-A-Cath is unchanged in position. Stable position of right-sided
chest tube with minimal right apical pneumothorax. Stable bilateral
lung opacities are noted consistent with multifocal pneumonia.
Left-sided chest tube is unchanged. Bony thorax is unremarkable.
IMPRESSION: Stable support apparatus. Stable bilateral lung opacities are noted
consistent with multifocal pneumonia. Stable position of bilateral
chest tubes with minimal right apical pneumothorax.

## 2022-01-24 IMAGING — DX DG CHEST 1V PORT
1 series · 1 of 1 positions shown · non-contrast
Comparison: Chest x-rays dated 08/08/2019.

CLINICAL DATA: Pneumothorax.

EXAM:
PORTABLE CHEST 1 VIEW

[chest ap]
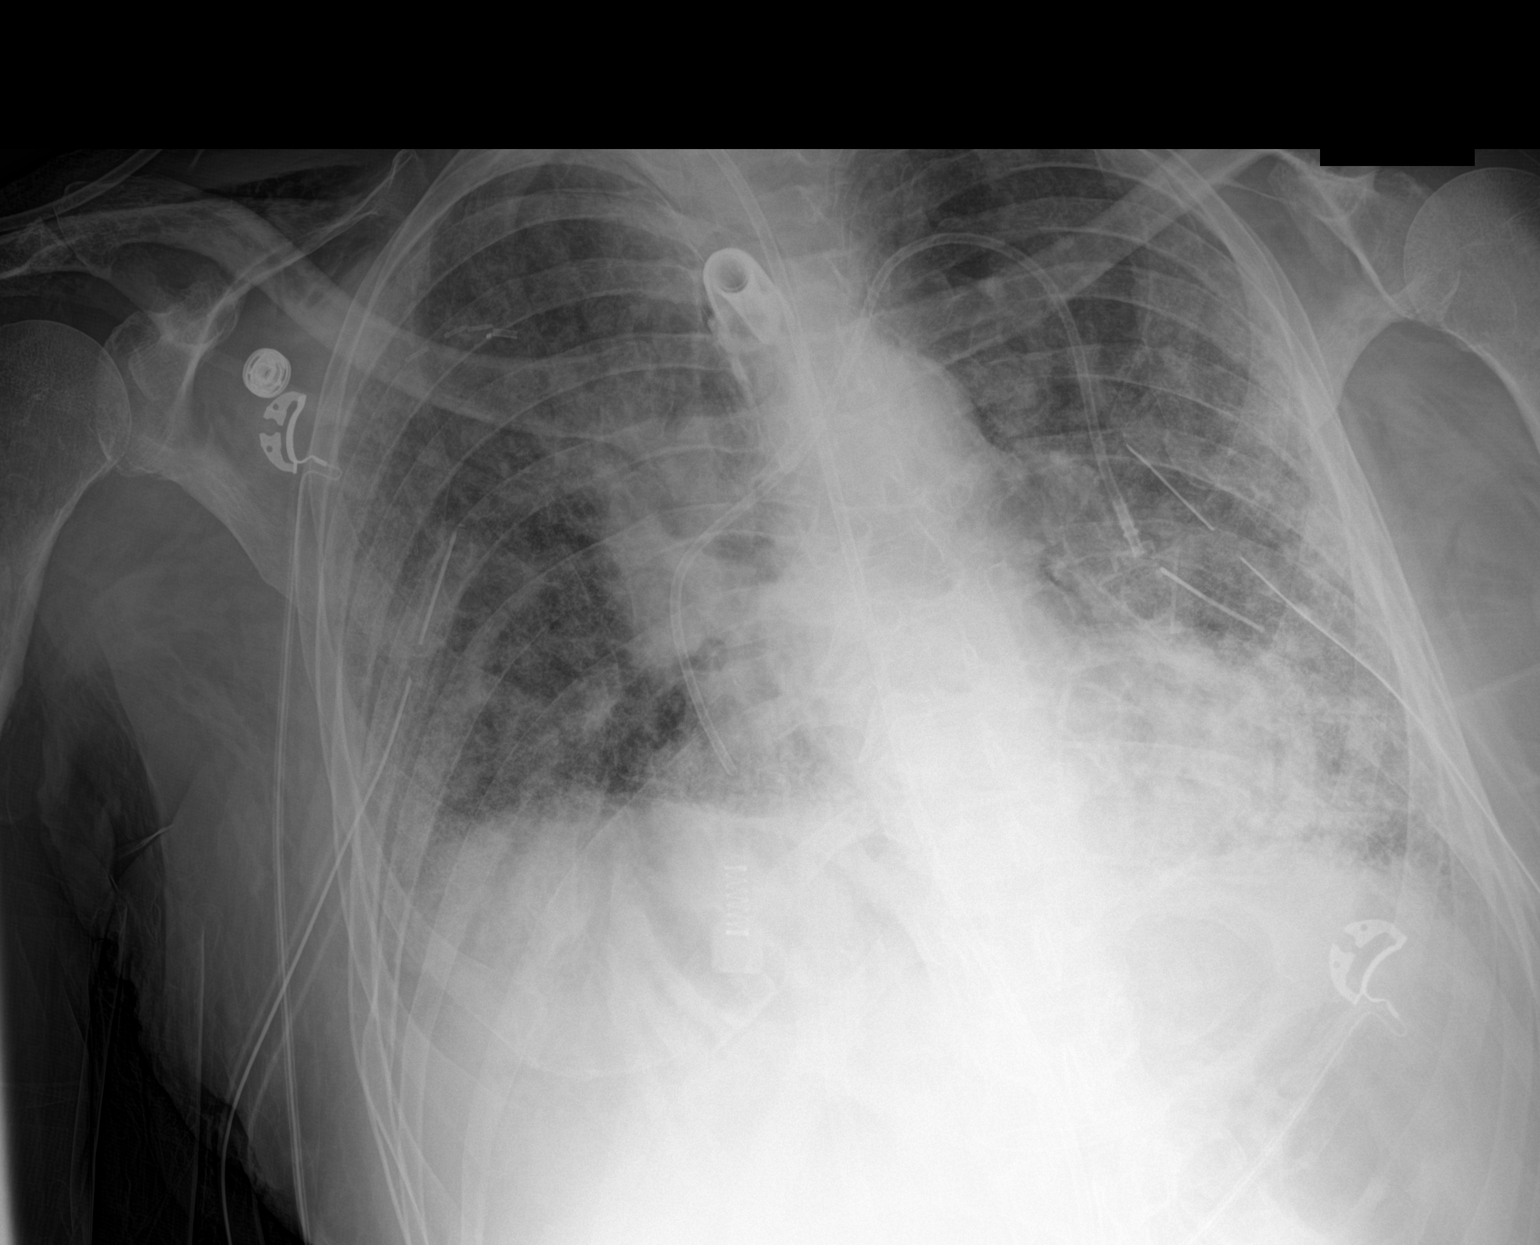

[1 of 1 positions shown; findings below may reference images not displayed]

FINDINGS: No residual pneumothorax appreciated on today's exam. Bilateral
chest tubes in place. Enteric tube passes below the diaphragm.
Tracheostomy tube appears appropriately positioned in the midline.
LEFT chest wall Port-A-Cath appears well positioned with tip at the
level of the RIGHT atrium.

Diffuse bilateral airspace opacities are stable. No pleural effusion
is seen.
IMPRESSION: 1. No residual pneumothorax appreciated on today's exam.
2. Diffuse bilateral airspace opacities, compatible with multifocal
pneumonia and/or pulmonary edema.
3. Support apparatus appears appropriately positioned.

## 2022-01-25 IMAGING — DX DG CHEST 1V PORT
1 series · 1 of 1 positions shown · non-contrast
Comparison: Chest radiograph from one day prior.

CLINICAL DATA: Chest tubes

EXAM:
PORTABLE CHEST 1 VIEW

[chest ap]
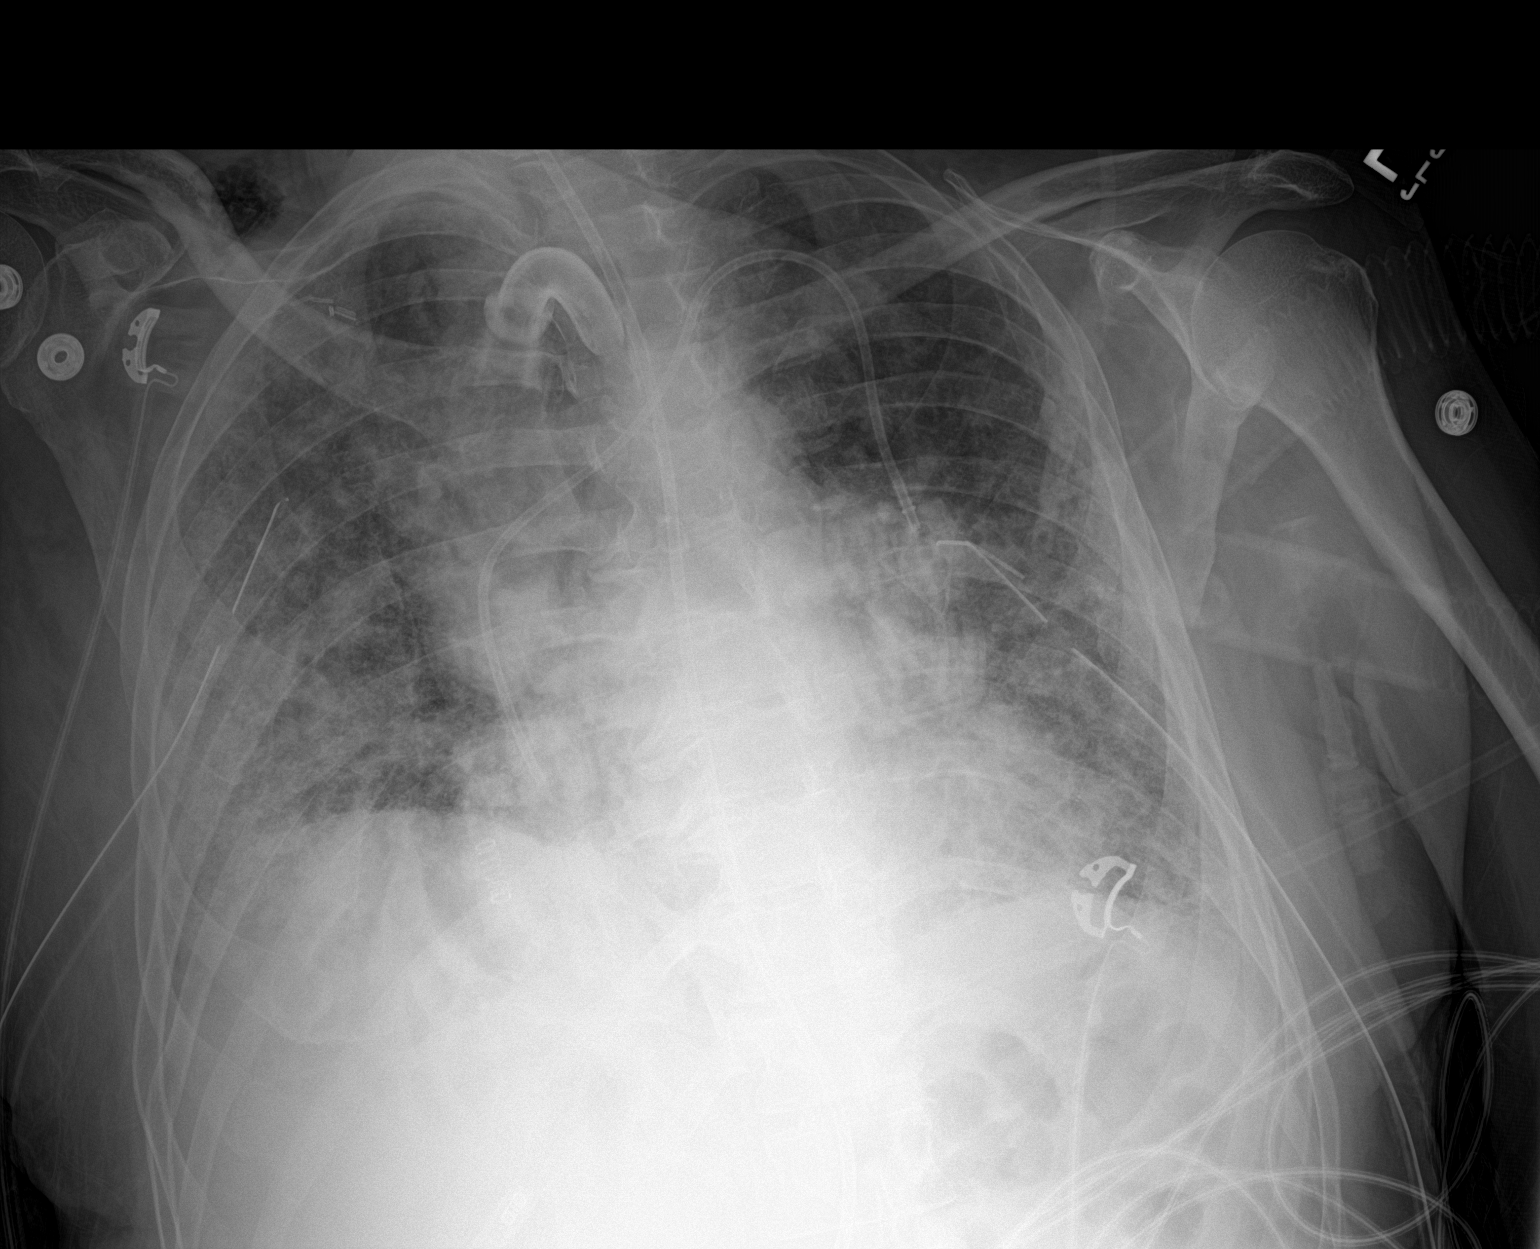

[1 of 1 positions shown; findings below may reference images not displayed]

FINDINGS: Tracheostomy tube tip overlies the tracheal air column at the
thoracic inlet. Enteric tube enters stomach with the tip not seen on
this image. Left internal jugular Port-A-Cath terminates over the
cavoatrial junction. Stable bilateral chest tubes. Stable
cardiomediastinal silhouette with top-normal heart size. No
pneumothorax. No pleural effusion. Extensive patchy opacities
throughout both lungs, not substantially changed. Stable mild
subcutaneous emphysema in the lower right neck.
IMPRESSION: 1. No pneumothorax, bilateral chest tubes in place. Well-positioned
support structures.
2. Stable extensive patchy opacities throughout both lungs, favor
multilobar pneumonia.

## 2022-01-26 IMAGING — DX DG ABD PORTABLE 1V
1 series · 1 of 1 positions shown · non-contrast
Comparison: 07/30/2019 abdominal radiograph

CLINICAL DATA: Enteric tube placement

EXAM:
PORTABLE ABDOMEN - 1 VIEW

[abdomen kub]
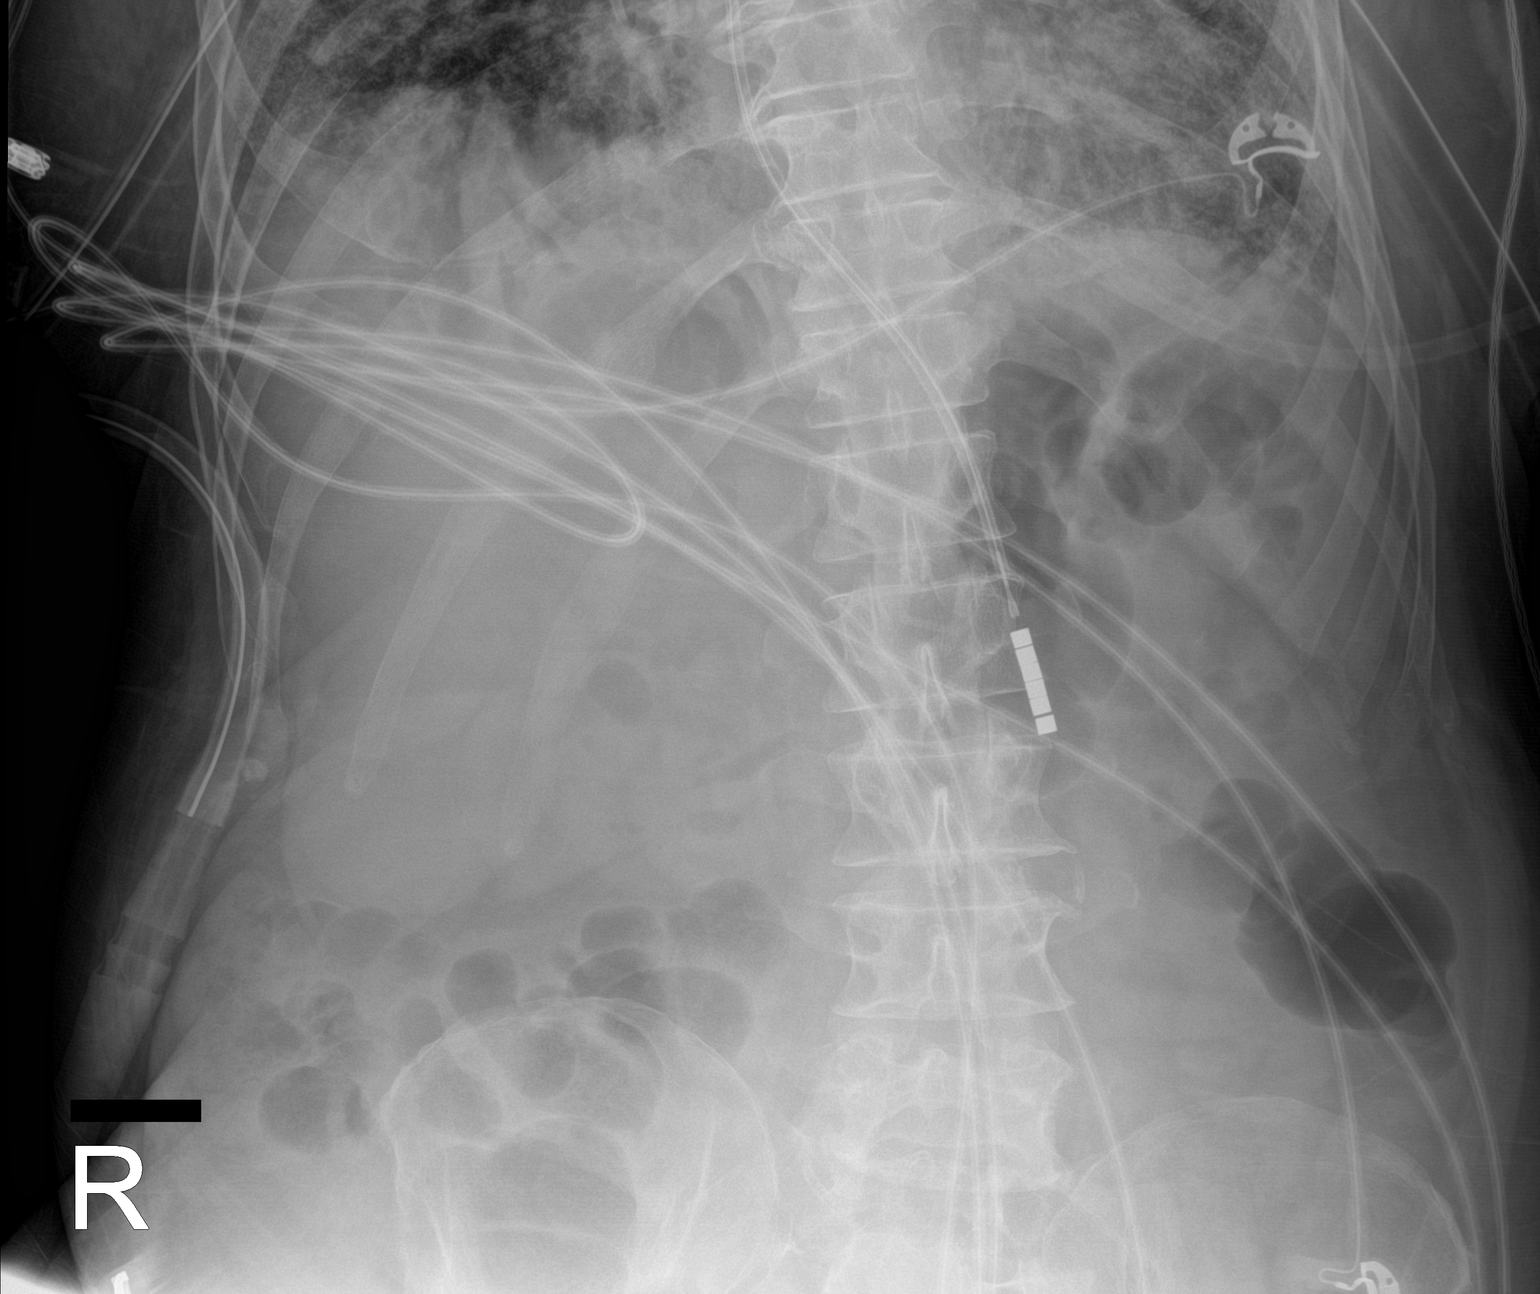

[1 of 1 positions shown; findings below may reference images not displayed]

FINDINGS: Weighted enteric tube tip in the body of the stomach. No dilated
small bowel loops. No evidence of pneumatosis or pneumoperitoneum.
Patchy bibasilar lung opacities.
IMPRESSION: Weighted enteric tube tip in the body of the stomach.

## 2022-01-27 IMAGING — DX DG CHEST 1V PORT
1 series · 1 of 1 positions shown · non-contrast
Comparison: August 12, 2019

CLINICAL DATA: Respiratory failure

EXAM:
PORTABLE CHEST 1 VIEW

[chest ap]
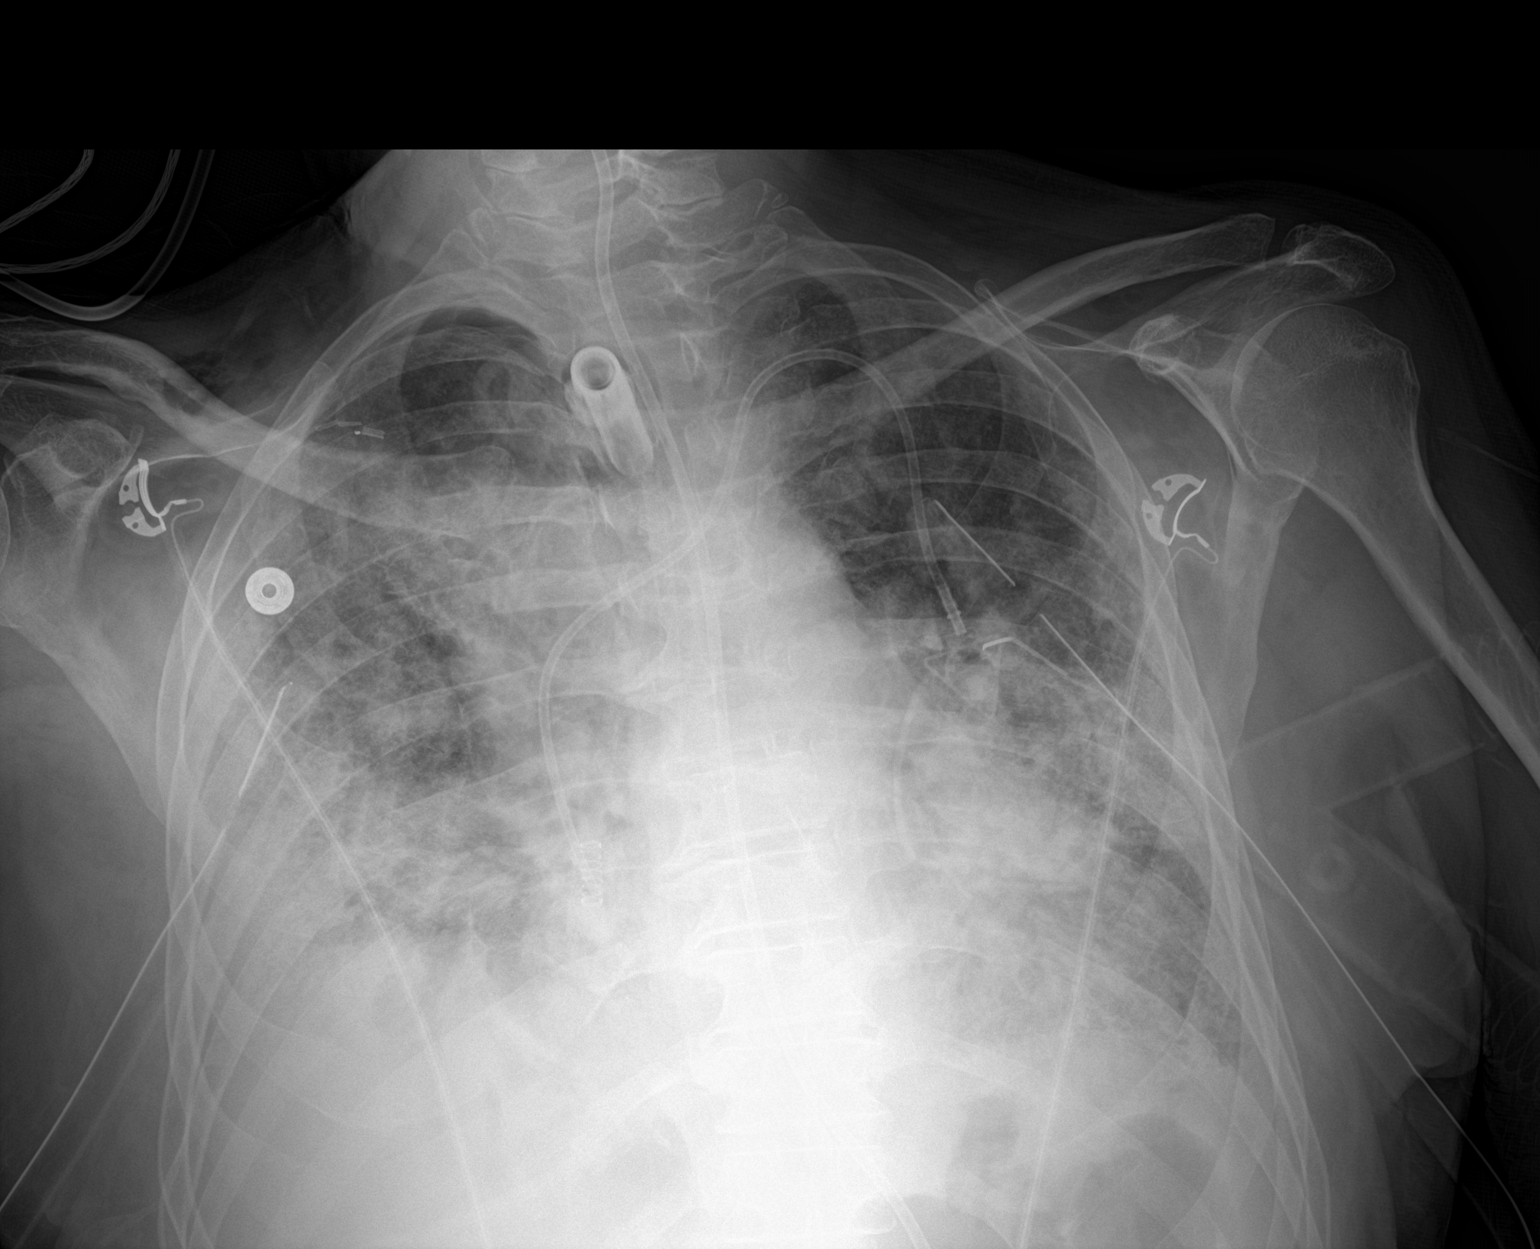

[1 of 1 positions shown; findings below may reference images not displayed]

FINDINGS: Tracheostomy catheter tip is 6.2 cm above the carina. Feeding tube
tip is below the diaphragm. Port-A-Cath tip is at the cavoatrial
junction. There is a chest tube on each side. There is a small
pneumothorax on each side, stable, without tension component. There
is airspace opacity throughout the lungs bilaterally, with
essentially no change in the right and slight increase in
consolidation in the left lower lung region compared to 1 day prior.
Heart size and pulmonary vascular normal. No adenopathy. No bone
lesions.
IMPRESSION: Small pneumothorax on each side, stable. Tube and catheter positions
as described. Multifocal airspace opacity, essentially stable on the
right and slightly increased on the left inferiorly. Stable cardiac
silhouette.

## 2022-01-29 IMAGING — DX DG CHEST 1V PORT
1 series · 1 of 1 positions shown · non-contrast
Comparison: Radiograph 08/12/2019, CT 08/05/2019

CLINICAL DATA: ARDS

EXAM:
PORTABLE CHEST 1 VIEW

[chest ap]
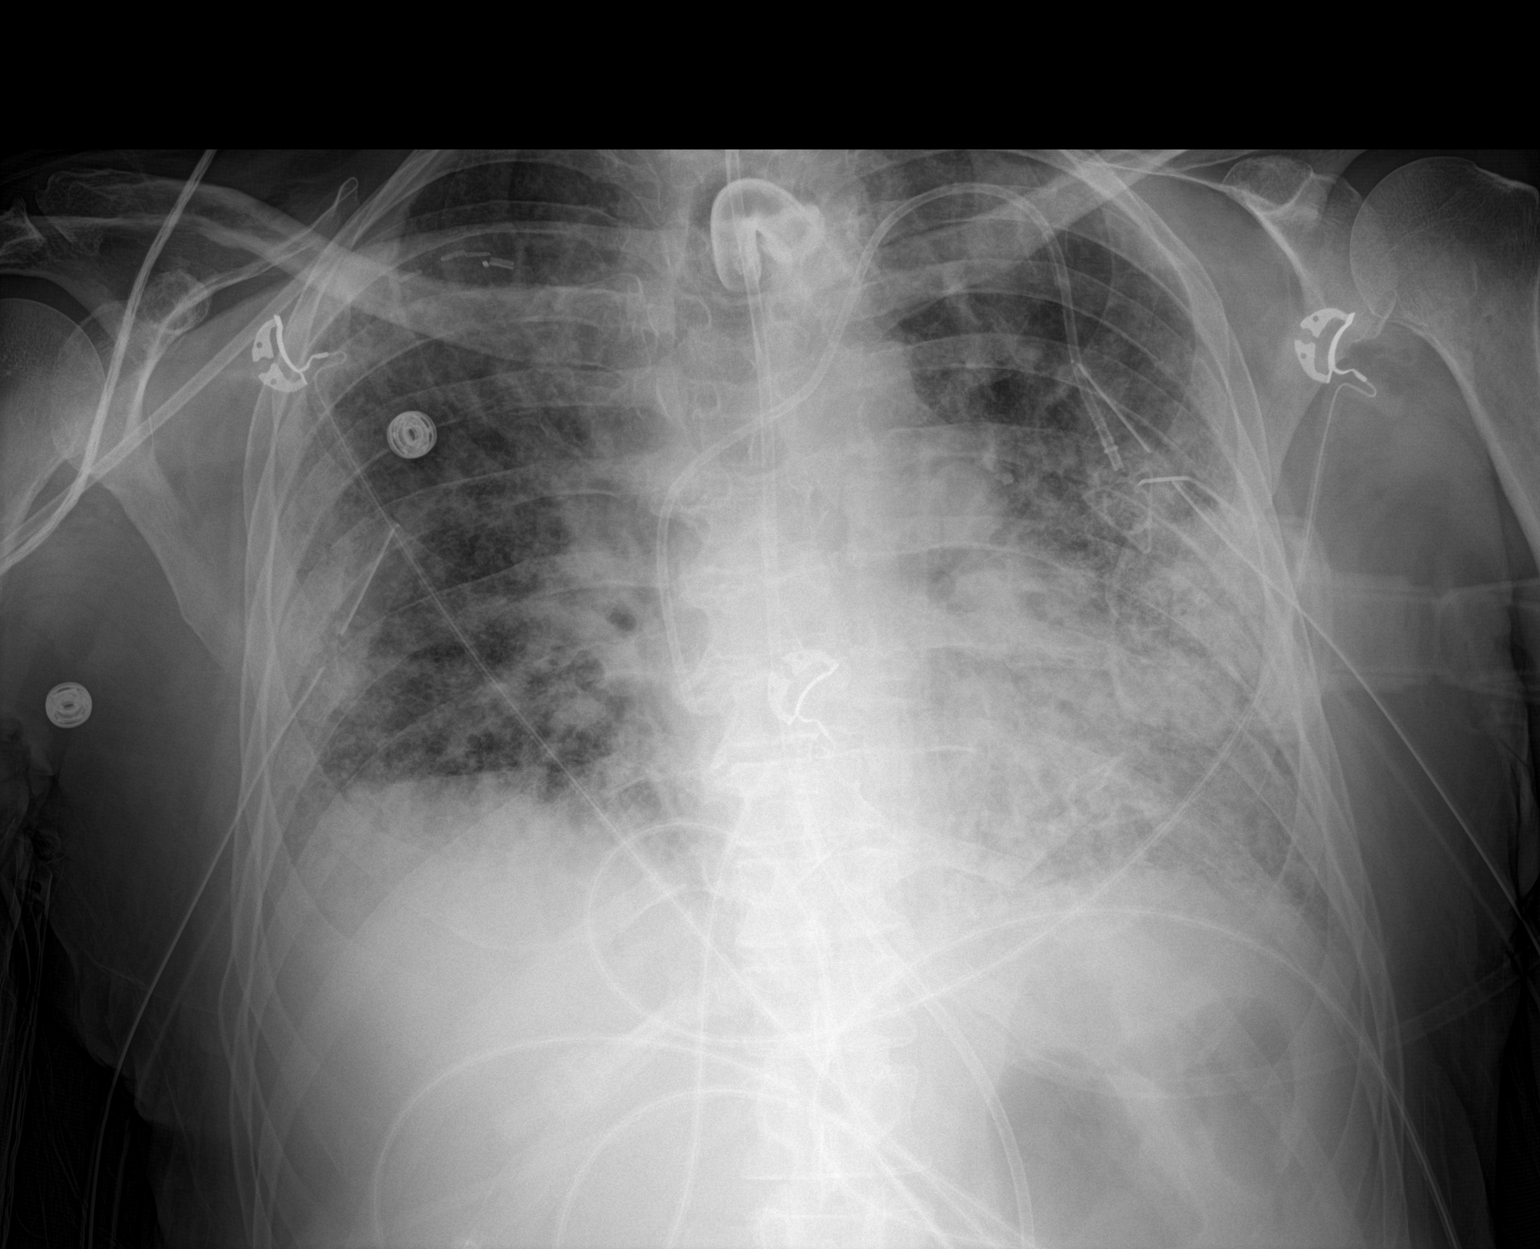

[1 of 1 positions shown; findings below may reference images not displayed]

FINDINGS: *Tracheostomy tube in the mid trachea.
*Transesophageal tube tip below the level of imaging, beyond the GE
junction.
*Tunneled left IJ approach Port-A-Cath tip at the cavoatrial
junction.
*Bilateral chest tubes in stable position.
*Telemetry leads and support devices overlie the chest.

Diffuse bilateral heterogeneous airspace opacities improved from
comparison radiographs with most pronounced clearing in the right
mid to lower lung. Trace residual right apical pneumothorax. The
previously seen left apical pneumothorax is not well visualized,
possibly resolved though cannot be fully excluded on non upright
film. Stable cardiomediastinal contours, partially obscured by
opacity. Osseous structures and soft tissues are unchanged from
prior with some residual subcutaneous emphysema along right upper
chest wall
IMPRESSION: 1. Improvement in bilateral heterogeneous airspace opacities.
2. Trace residual right apical pneumothorax.
3. Previously seen left apical pneumothorax is not well visualized,
possibly resolved though cannot be fully excluded on non upright
film.

## 2022-01-30 IMAGING — DX DG ABDOMEN 1V
1 series · 1 of 1 positions shown · non-contrast
Comparison: 08/11/2019

CLINICAL DATA: Encounter for feeding tube placement.

EXAM:
ABDOMEN - 1 VIEW

[abdomen kub]
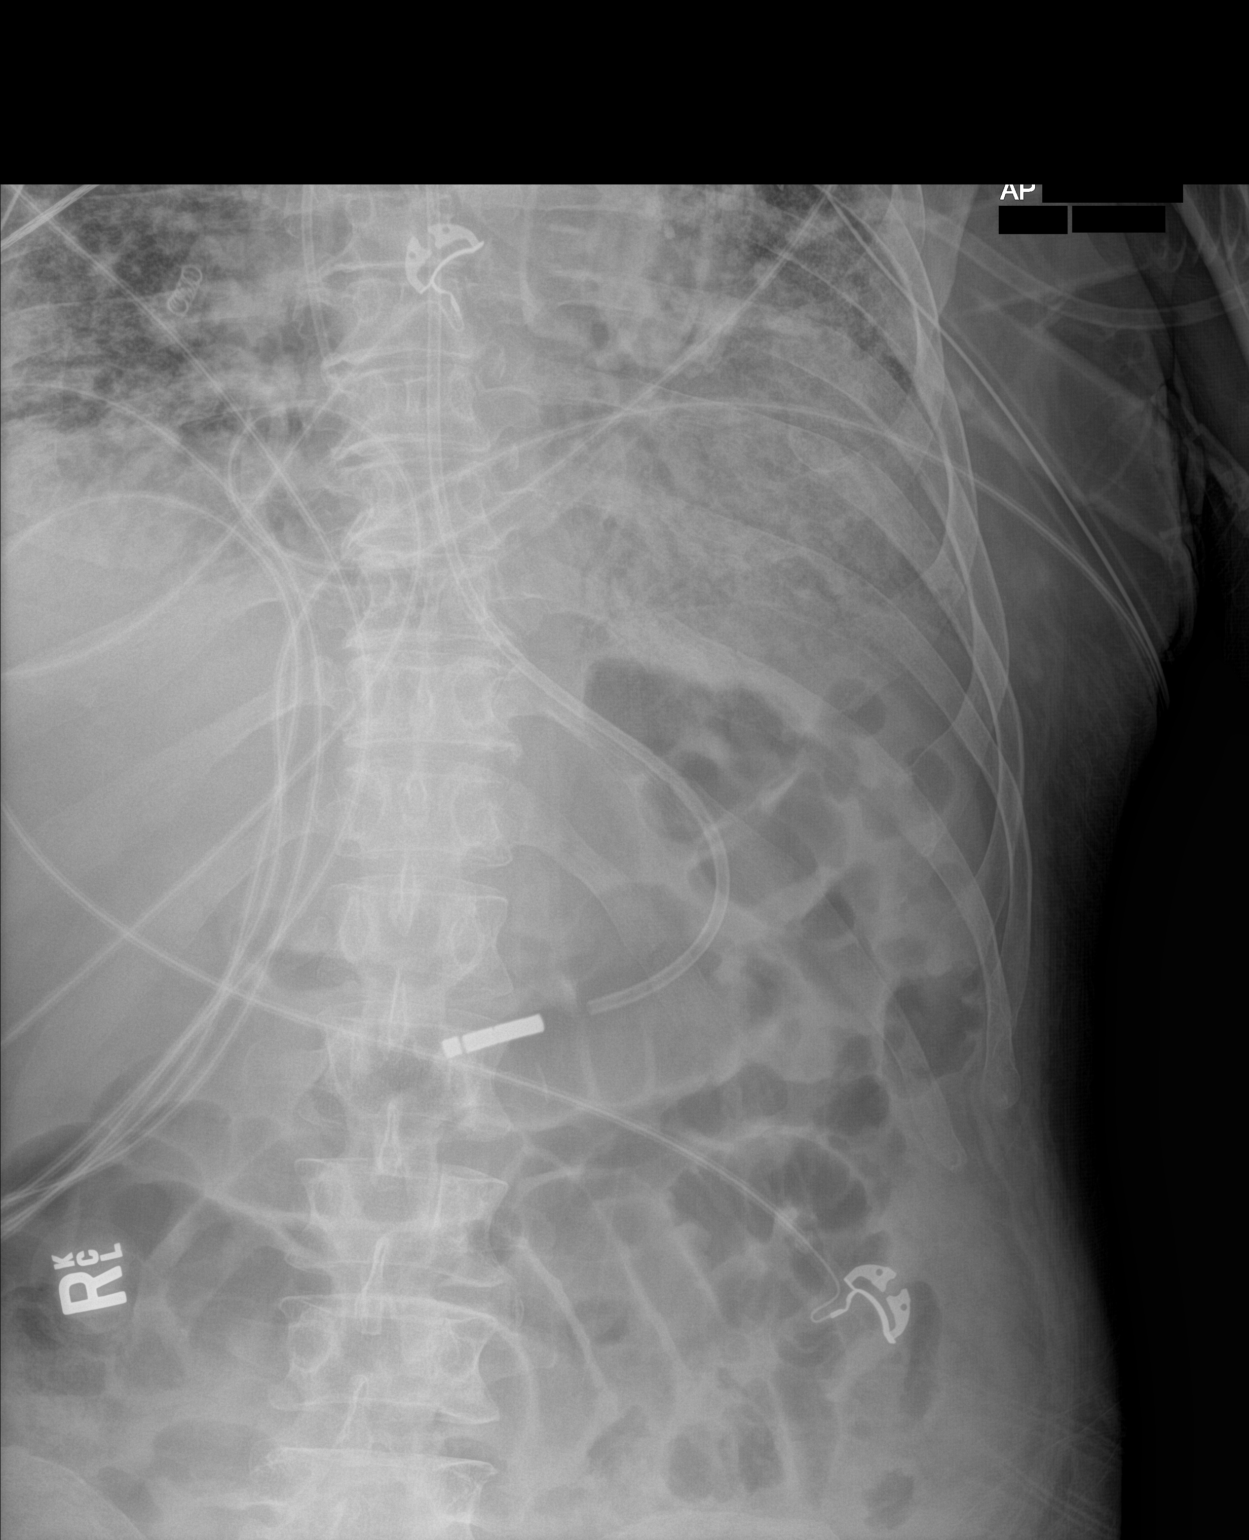

[1 of 1 positions shown; findings below may reference images not displayed]

FINDINGS: There is a feeding tube in the upper abdomen. Tube tip is in the
expected region of the distal stomach body or gastric antrum.
Extensive densities at both lung bases. Patient has a Port-A-Cath
with the tip near the SVC and right atrium junction.
IMPRESSION: Feeding tube tip in the distal stomach region.

## 2022-01-30 IMAGING — DX DG CHEST 1V PORT
1 series · 1 of 1 positions shown · non-contrast
Comparison: August 14, 2019.

CLINICAL DATA: Pneumothorax.

EXAM:
PORTABLE CHEST 1 VIEW

[chest ap]
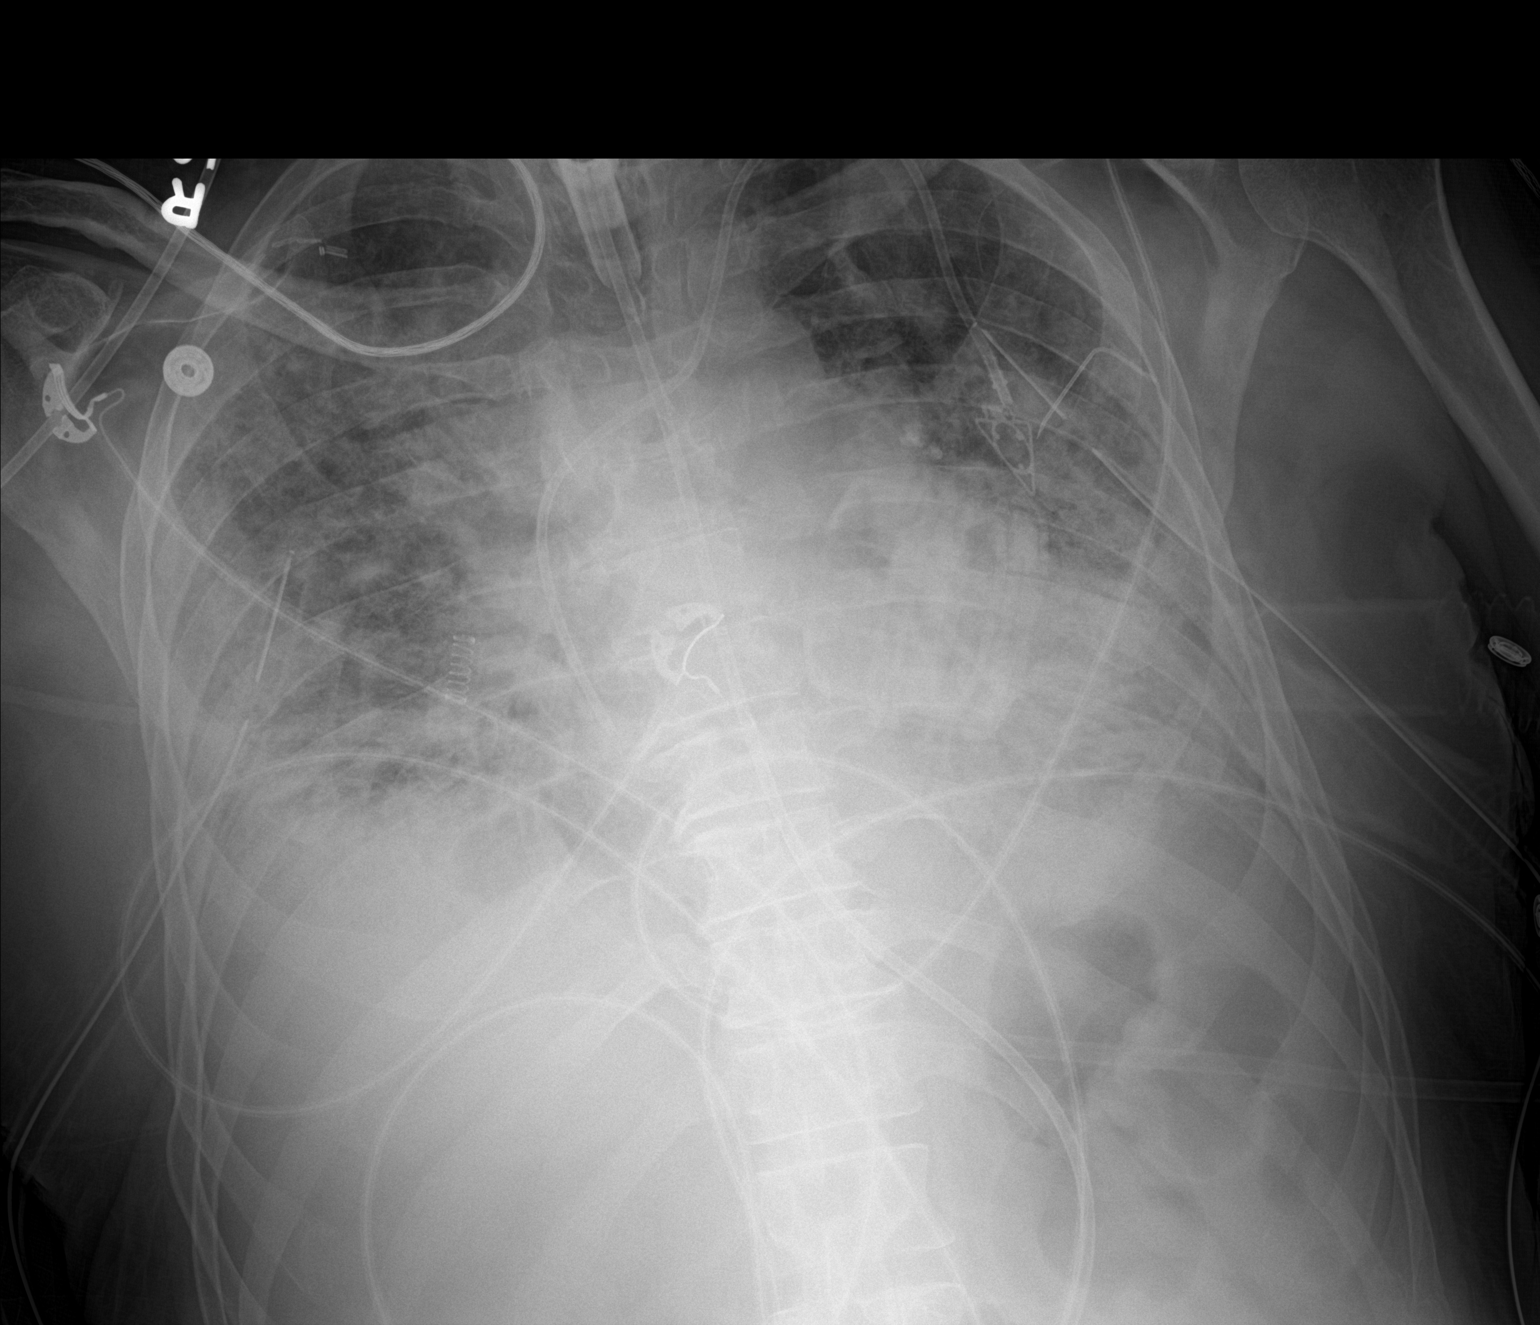

[1 of 1 positions shown; findings below may reference images not displayed]

FINDINGS: Stable cardiomediastinal silhouette. Feeding tube is seen entering
stomach. Left internal jugular Port-A-Cath is unchanged in position.
Tracheostomy tube is in good position. No definite pneumothorax is
noted. Bilateral chest tubes are noted. Bilateral lung opacities are
noted concerning for pneumonia or possibly edema. Small pleural
effusions cannot be excluded. Bony thorax is unremarkable.
IMPRESSION: Bilateral chest tubes are noted without definite pneumothorax.
Bilateral lung opacities are noted concerning for pneumonia or
possibly edema. Small pleural effusions cannot be excluded.

## 2022-01-31 IMAGING — DX DG CHEST 1V PORT
1 series · 1 of 1 positions shown · non-contrast
Comparison: 08/15/2019.

CLINICAL DATA: Respiratory failure.  Chest tubes.

EXAM:
PORTABLE CHEST 1 VIEW

[chest ap]
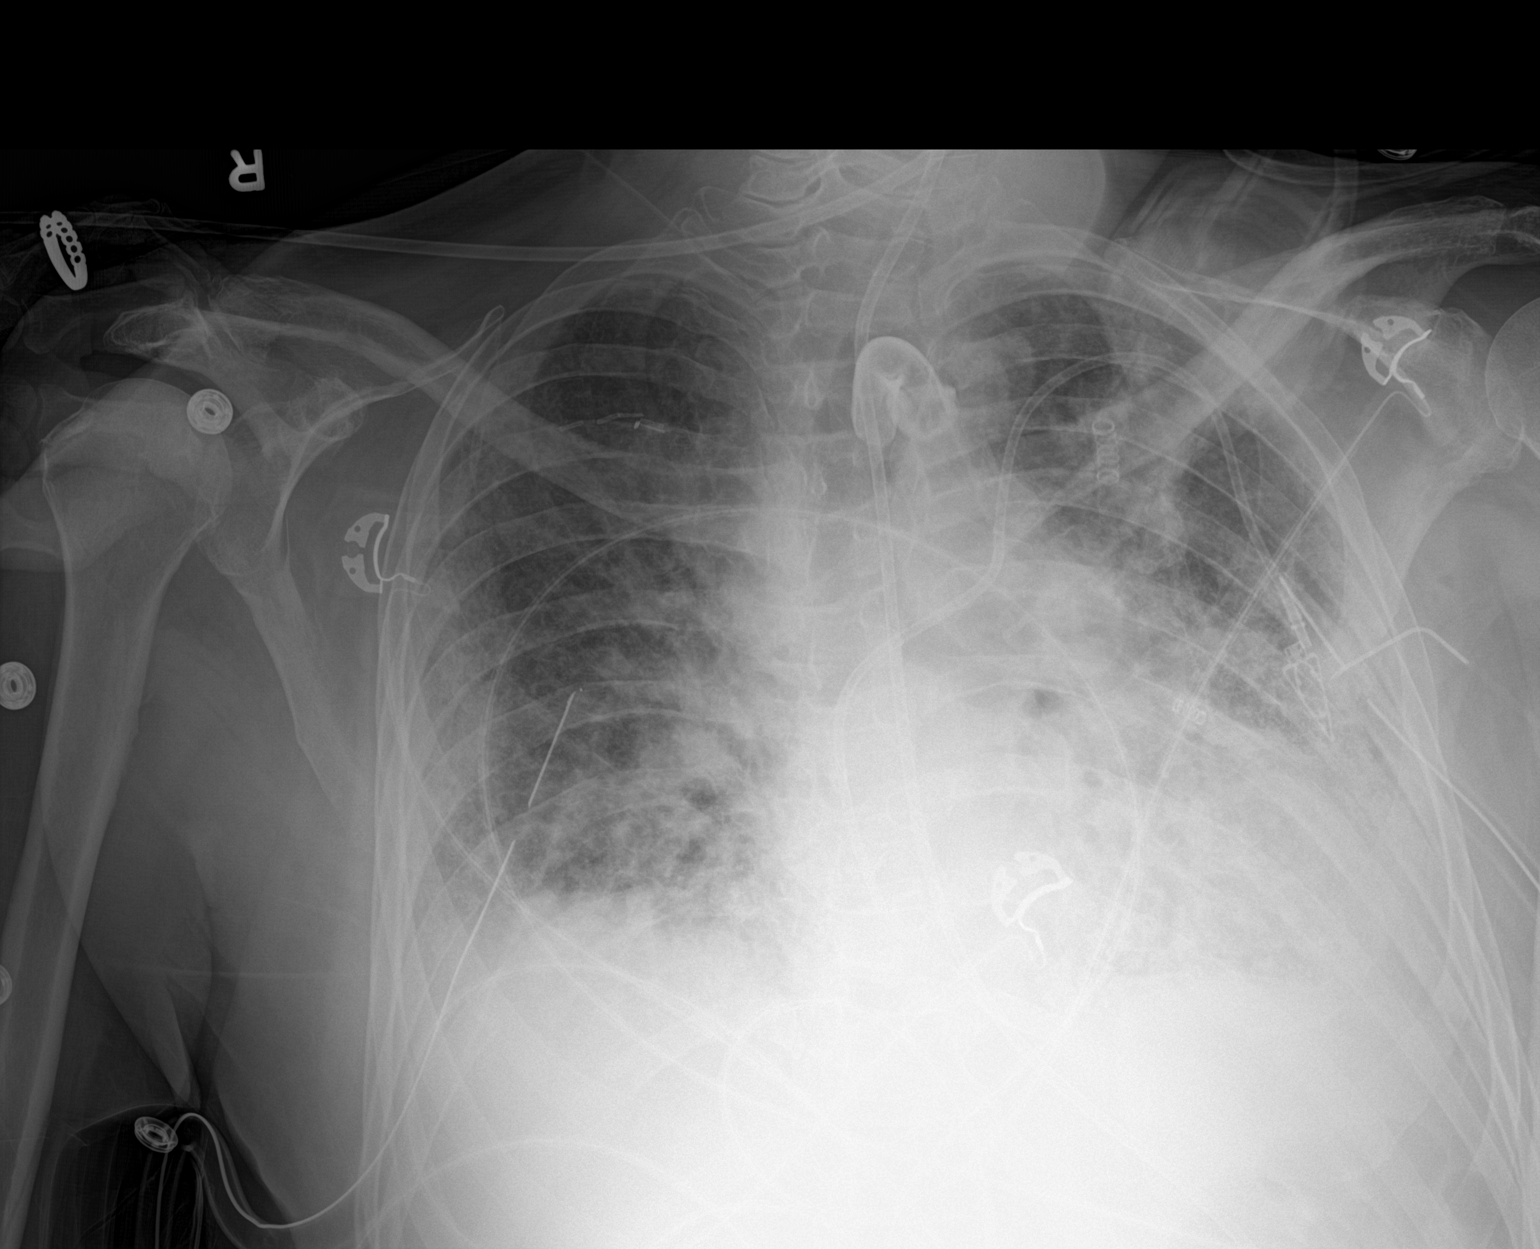

[1 of 1 positions shown; findings below may reference images not displayed]

FINDINGS: Tracheostomy tube, feeding tube, PowerPort catheter, bilateral chest
tubes in unchanged position. No pneumothorax. Stable cardiomegaly.
Diffuse bilateral pulmonary infiltrates/edema again noted. Slight
improvement in aeration right lung noted on today's exam. Small
pleural effusions again cannot be excluded. Surgical clips right
chest.
IMPRESSION: 1. Lines and tubes including bilateral chest tubes in stable
position. No pneumothorax.

2. Diffuse bilateral pulmonary infiltrates/edema again noted. Slight
improvement in aeration in the right lung noted on today's exam.
Small bilateral pleural effusions again cannot be excluded.

## 2022-02-01 IMAGING — DX DG CHEST 1V PORT
1 series · 1 of 1 positions shown · non-contrast
Comparison: Chest radiograph 08/16/2019

CLINICAL DATA: Respiratory failure

EXAM:
PORTABLE CHEST 1 VIEW

[chest ap]
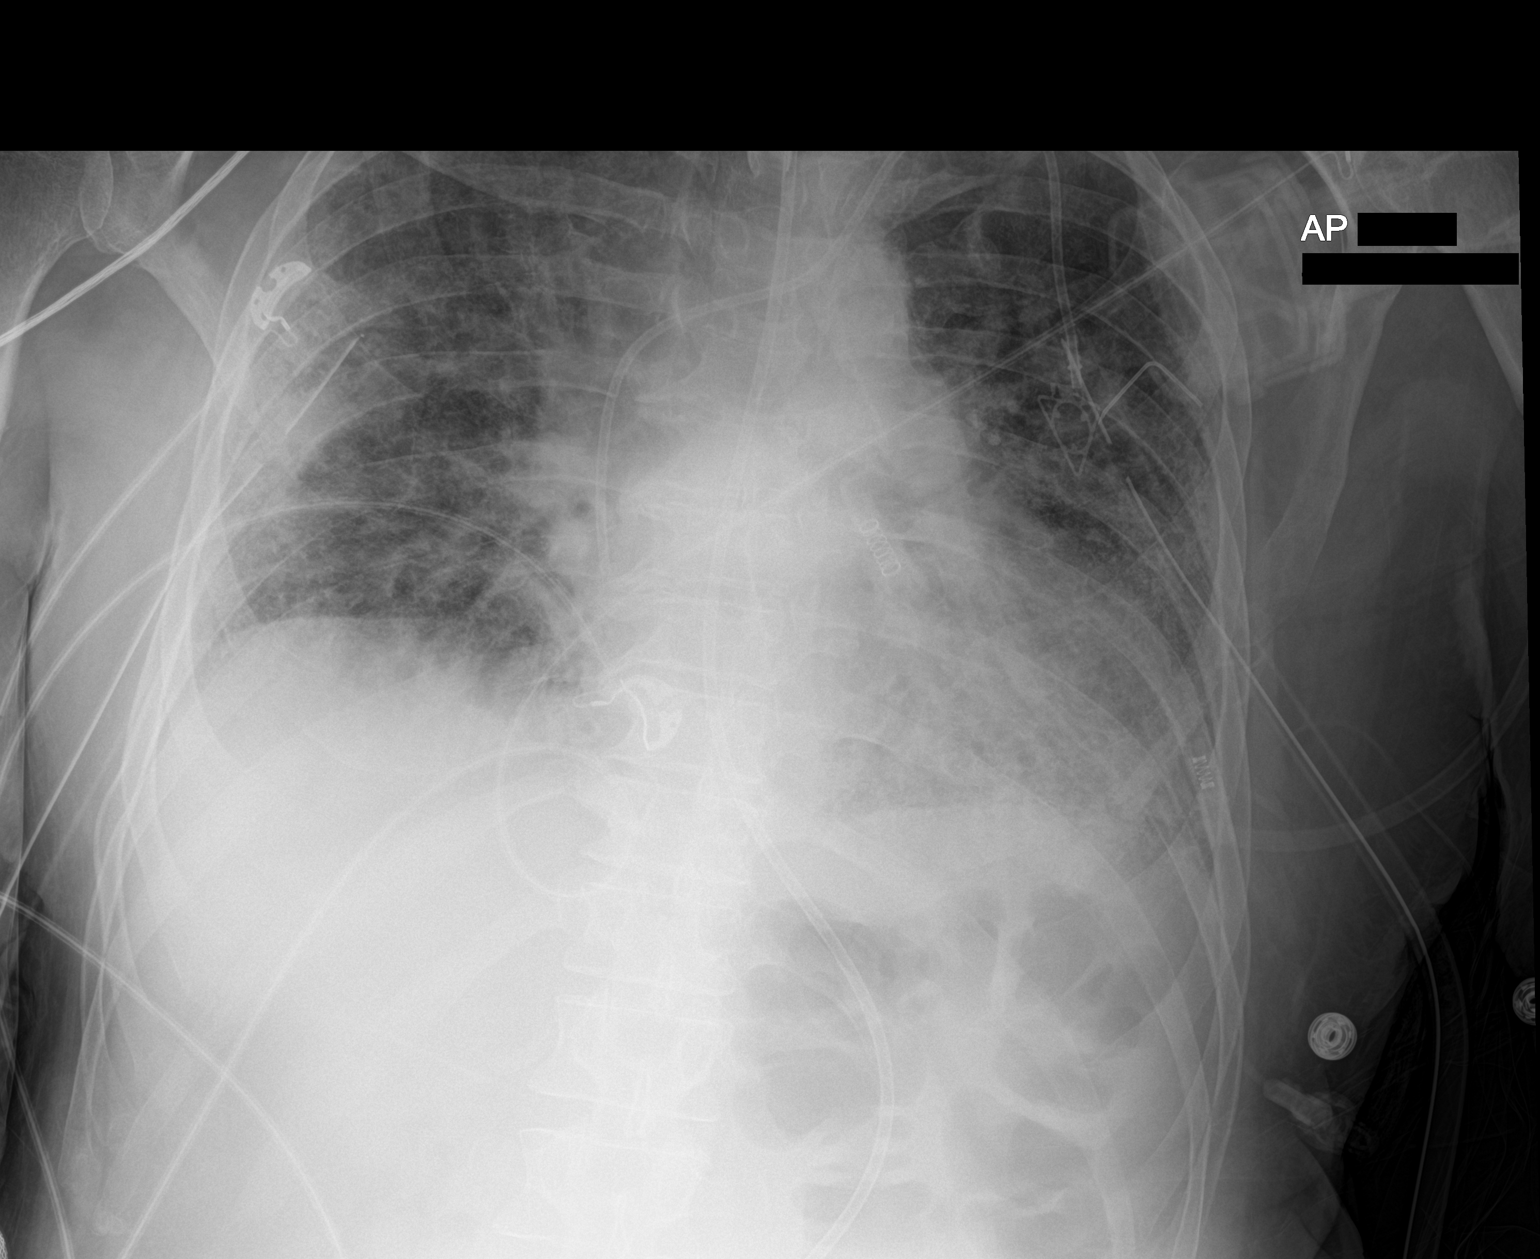

[1 of 1 positions shown; findings below may reference images not displayed]

FINDINGS: Tracheostomy tube, feeding tube, and LEFT chest tube unchanged.
RIGHT chest tube also. Port in the anterior chest wall with tip in
distal SVC. Diffuse fine bilateral airspace disease is similar
prior. No pneumothorax. No pleural fluid.
IMPRESSION: 1. Stable support apparatus.
2. Stable diffuse fine airspace disease.
3. Bilateral chest tubes without evidence of pneumothorax.

## 2022-02-02 IMAGING — DX DG ABDOMEN 1V
1 series · 1 of 1 positions shown · non-contrast
Comparison: August 15, 2019

CLINICAL DATA: Repositioning of feeding tube.

EXAM:
ABDOMEN - 1 VIEW

[abdomen kub]
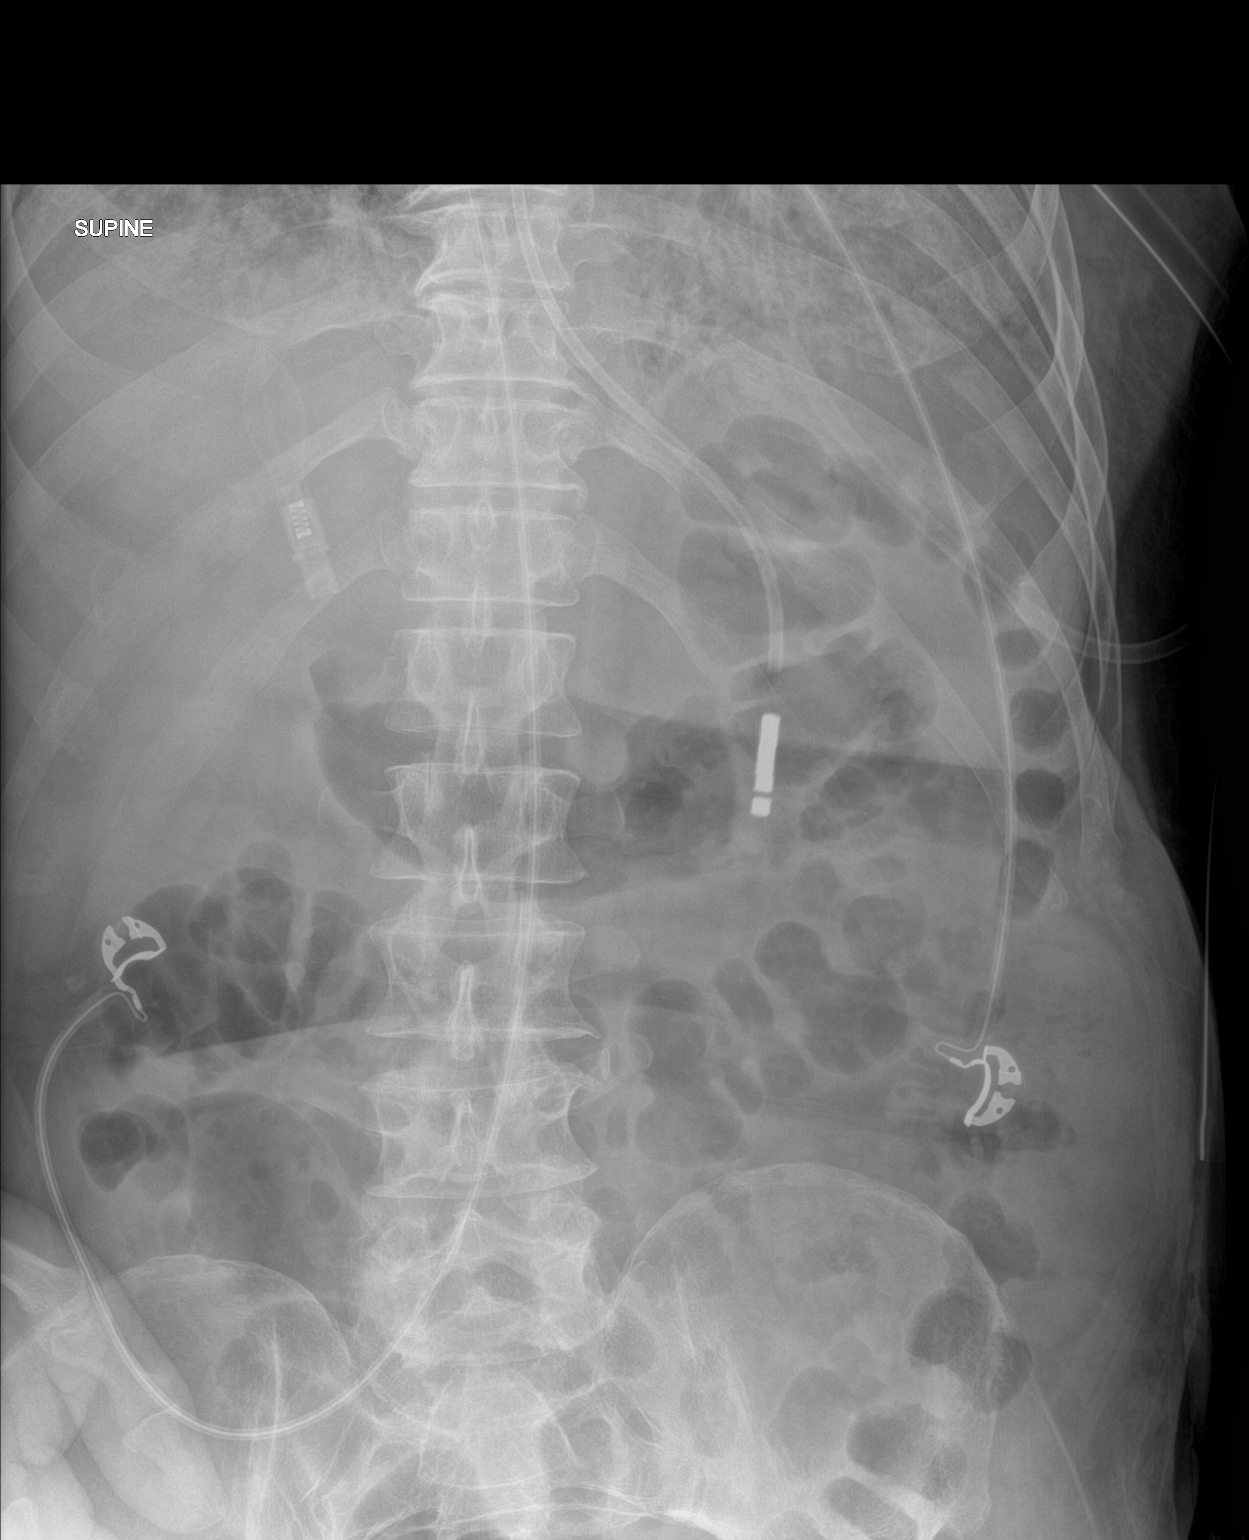

[1 of 1 positions shown; findings below may reference images not displayed]

FINDINGS: Nonobstructive bowel gas pattern.

Feeding catheter overlies the expected location of proximal gastric
body.

Patchy interstitial and alveolar opacities in bilateral lung bases.
IMPRESSION: 1. Feeding catheter overlies the expected location of proximal
gastric body.
2. Nonobstructive bowel gas pattern.
3. Patchy interstitial and alveolar opacities in the visualized lung
bases.

## 2022-02-02 IMAGING — DX DG CHEST 1V PORT
1 series · 1 of 1 positions shown · non-contrast
Comparison: Chest radiograph dated 08/17/2019.

CLINICAL DATA: 63-year-old male with aspiration.

EXAM:
PORTABLE CHEST 1 VIEW

[chest ap]
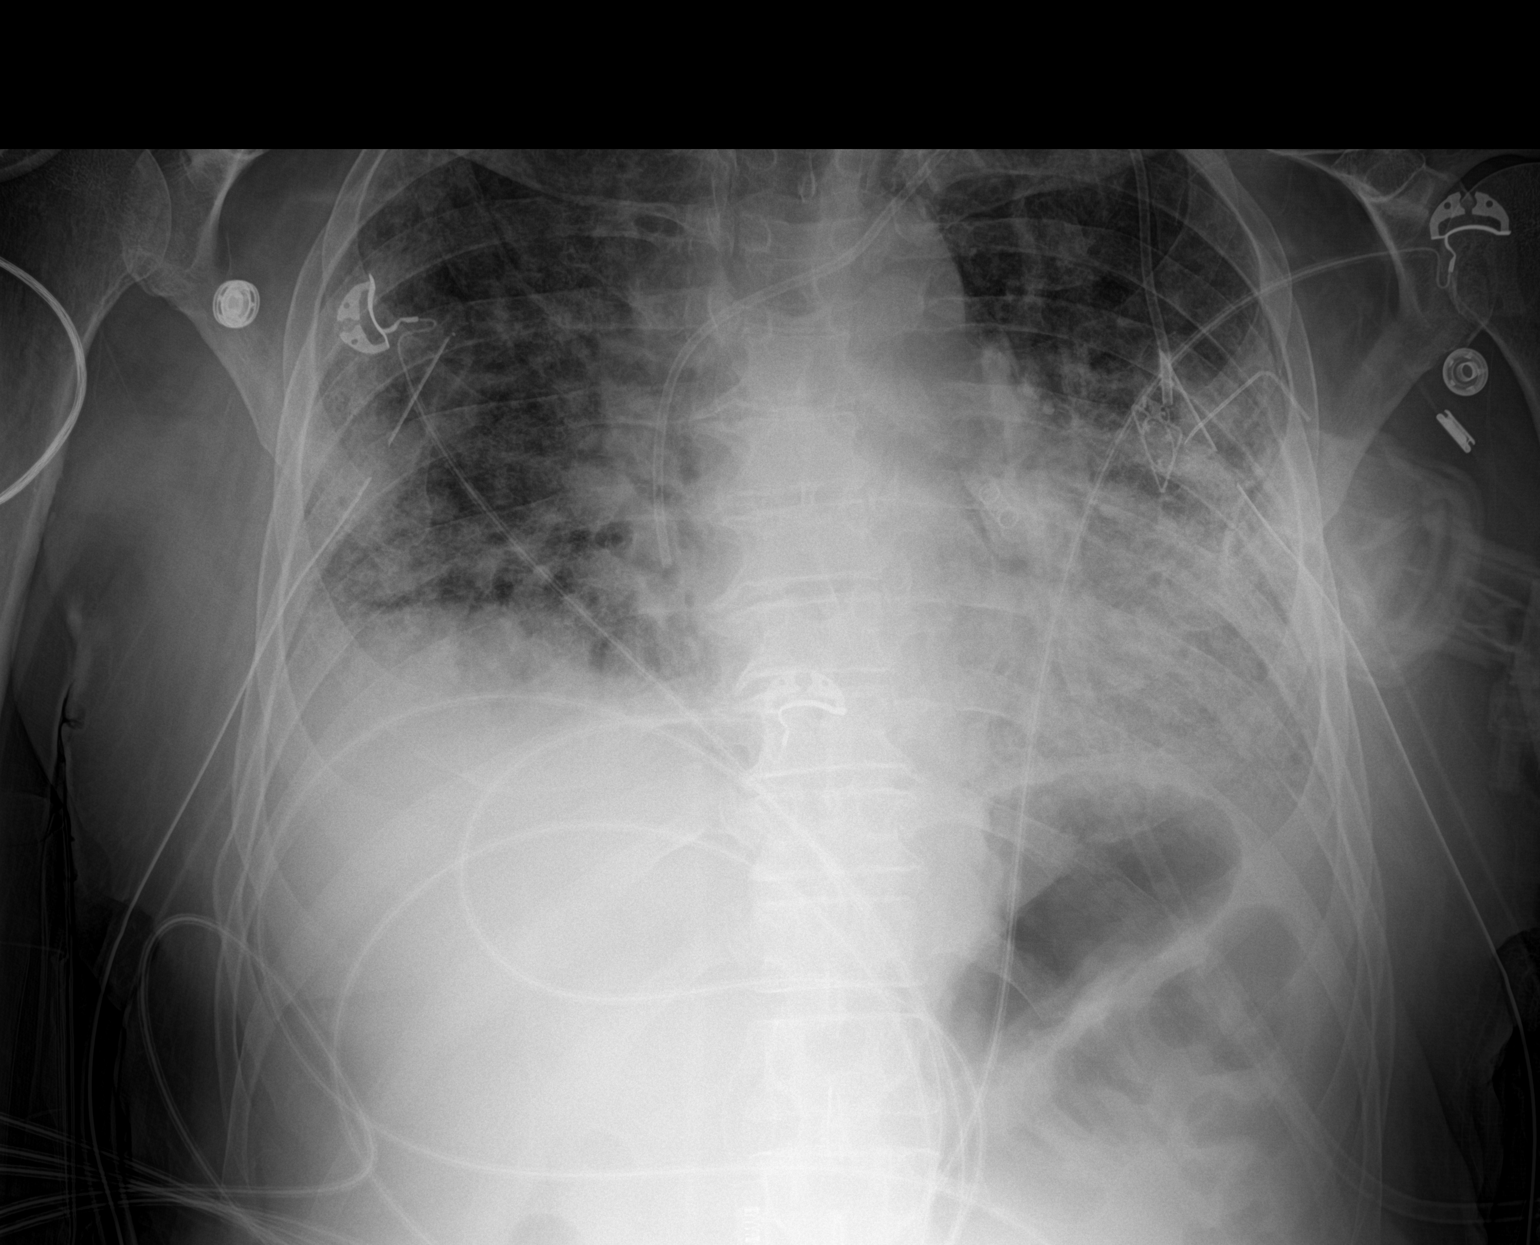

[1 of 1 positions shown; findings below may reference images not displayed]

FINDINGS: Tracheostomy and left-sided Port-A-Cath in similar position.
Right-sided chest tube is also noted in similar position. Bilateral
pulmonary densities with no significant interval change. Stable
cardiomediastinal silhouette. No acute osseous pathology.
IMPRESSION: No significant interval change.

## 2022-02-03 IMAGING — DX DG CHEST 1V PORT
1 series · 1 of 1 positions shown · non-contrast
Comparison: August 18, 2019.

CLINICAL DATA: Respiratory failure with hypoxemia.

EXAM:
PORTABLE CHEST 1 VIEW

[chest ap]
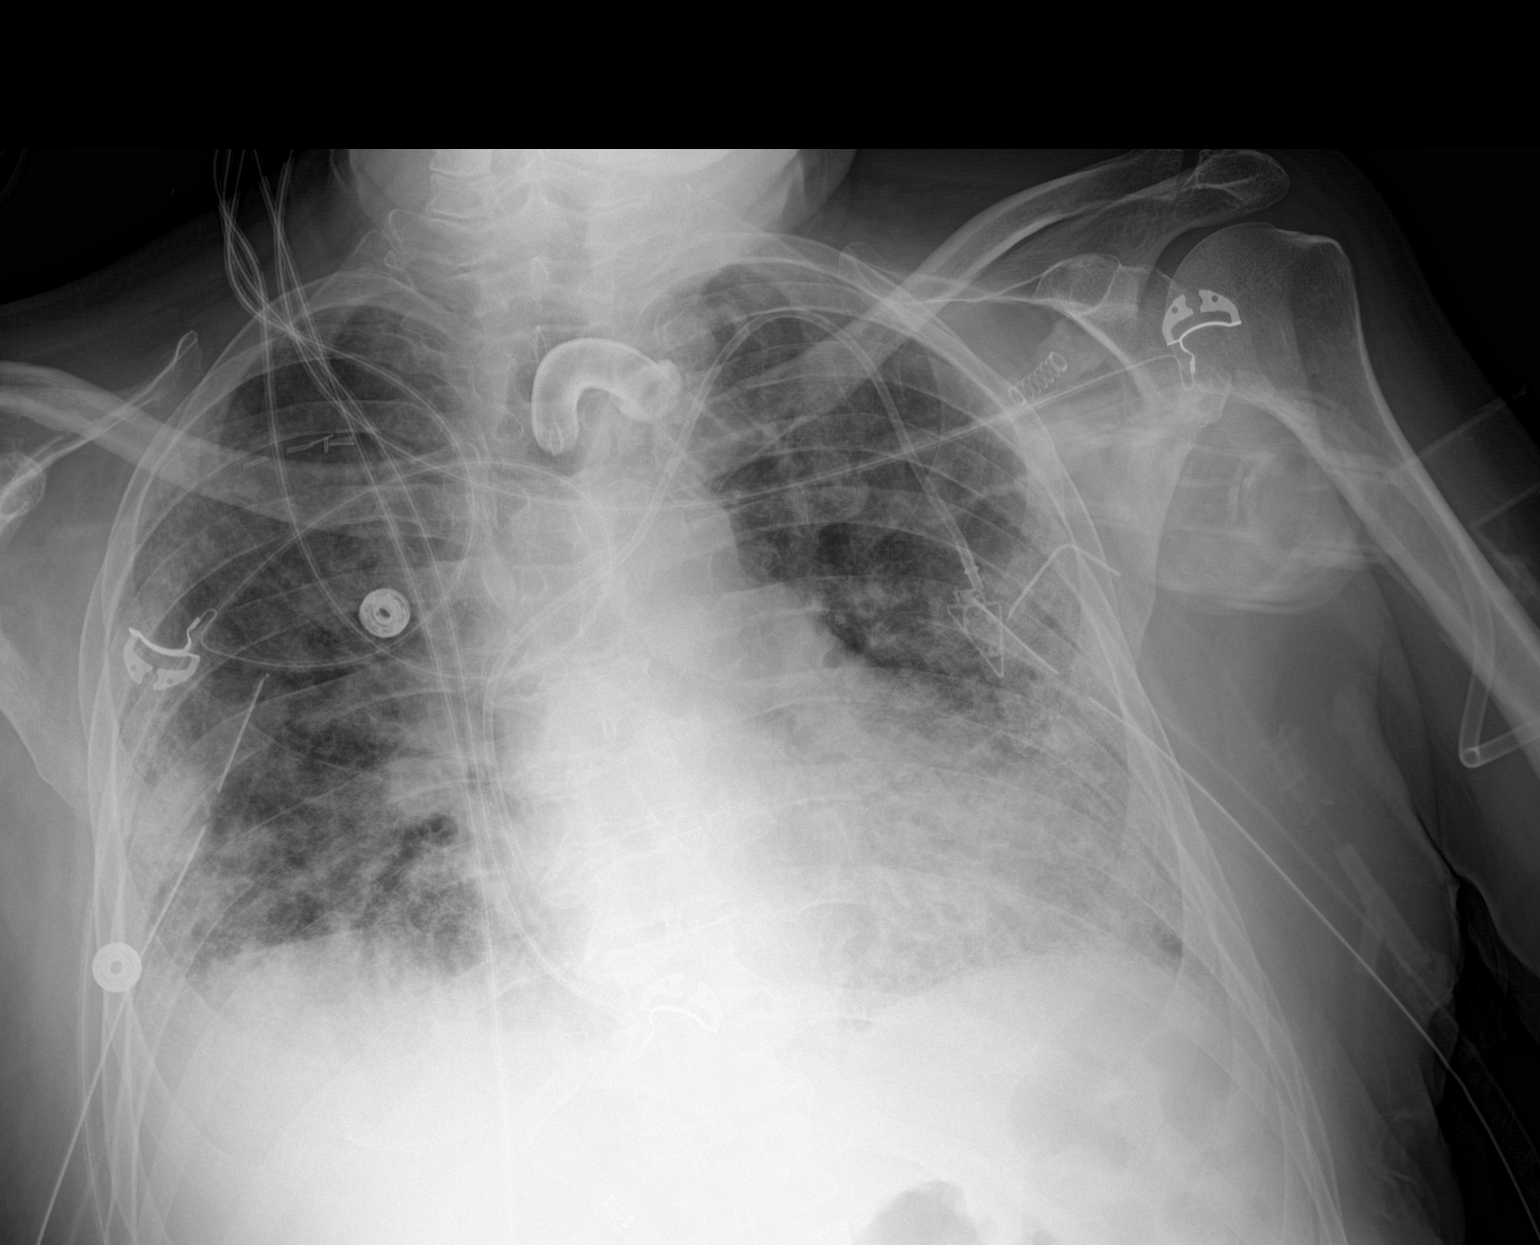

[1 of 1 positions shown; findings below may reference images not displayed]

FINDINGS: Stable cardiomediastinal silhouette. Tracheostomy tube is unchanged
in position. Left subclavian Port-A-Cath is unchanged. Bilateral
chest tubes are noted without pneumothorax. Stable bilateral lung
opacities are noted concerning for multifocal pneumonia. Bony thorax
is unremarkable.
IMPRESSION: Stable bilateral lung opacities are noted concerning for multifocal
pneumonia. Stable bilateral chest tubes are noted without
pneumothorax.

## 2022-02-04 IMAGING — RF DG INTRO LONG GI TUBE
2 series · 5 of 5 positions shown · non-contrast
Comparison: None.

CLINICAL DATA: NG tube placement

EXAM:
FL FEEDING TUBE PLACEMENT
FLUOROSCOPY TIME:  Fluoroscopy Time:  16 seconds

[Series 1: cp_standard · 0.26mm/px · 1 of 1 slices shown (1 of 2)]
[im 1/1]
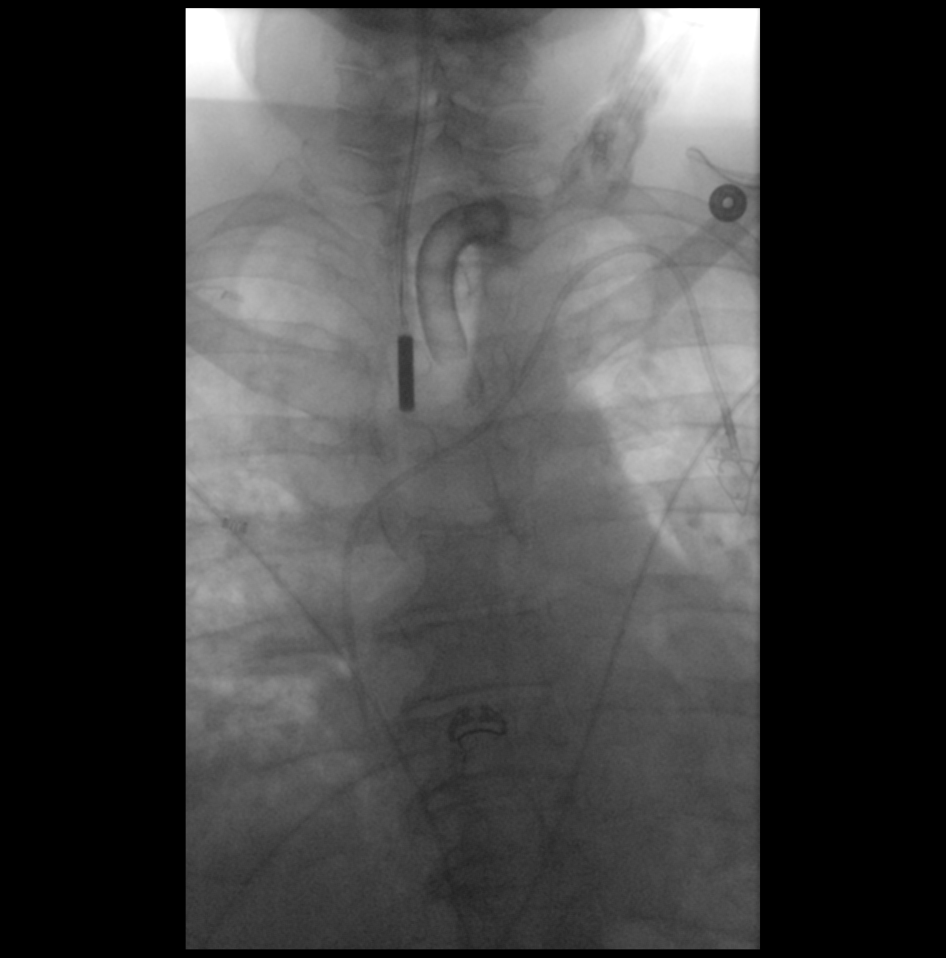

[Series 2: cp_standard · 0.51mm/px · 4 of 7 frames shown (2 of 2)]
[frame 2/7]
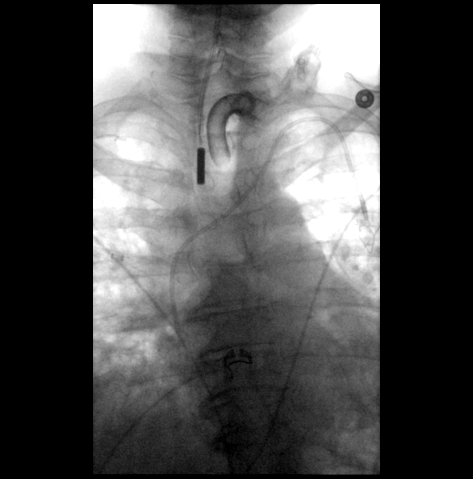
[frame 4/7]
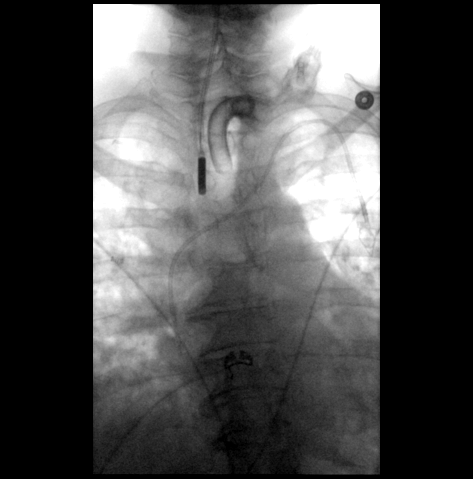
[frame 6/7]
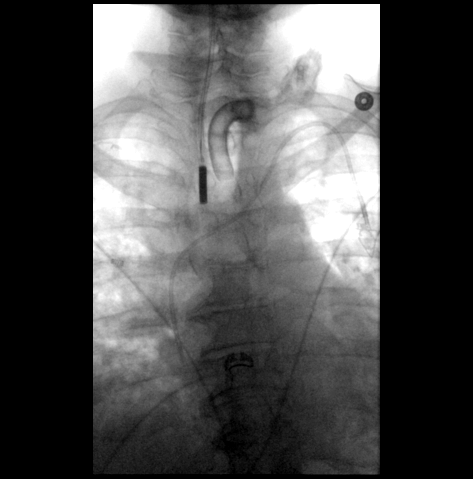
[frame 7/7]
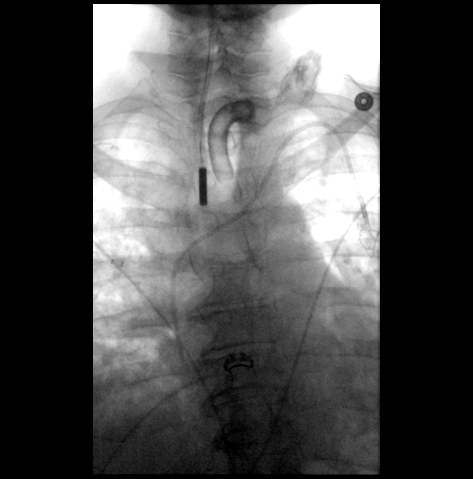

[5 of 5 positions shown; findings below may reference images not displayed]

FINDINGS: Attempt was made to place nasogastric tube. The tube consistently
went into the airway and caught on the tracheostomy balloon.
IMPRESSION: Unsuccessful fluoroscopic guided feeding tube placement.

## 2022-02-04 IMAGING — DX DG ABD PORTABLE 1V
1 series · 1 of 1 positions shown · non-contrast
Comparison: 08/18/2019

CLINICAL DATA: NG tube placement

EXAM:
PORTABLE ABDOMEN - 1 VIEW

[abdomen kub]
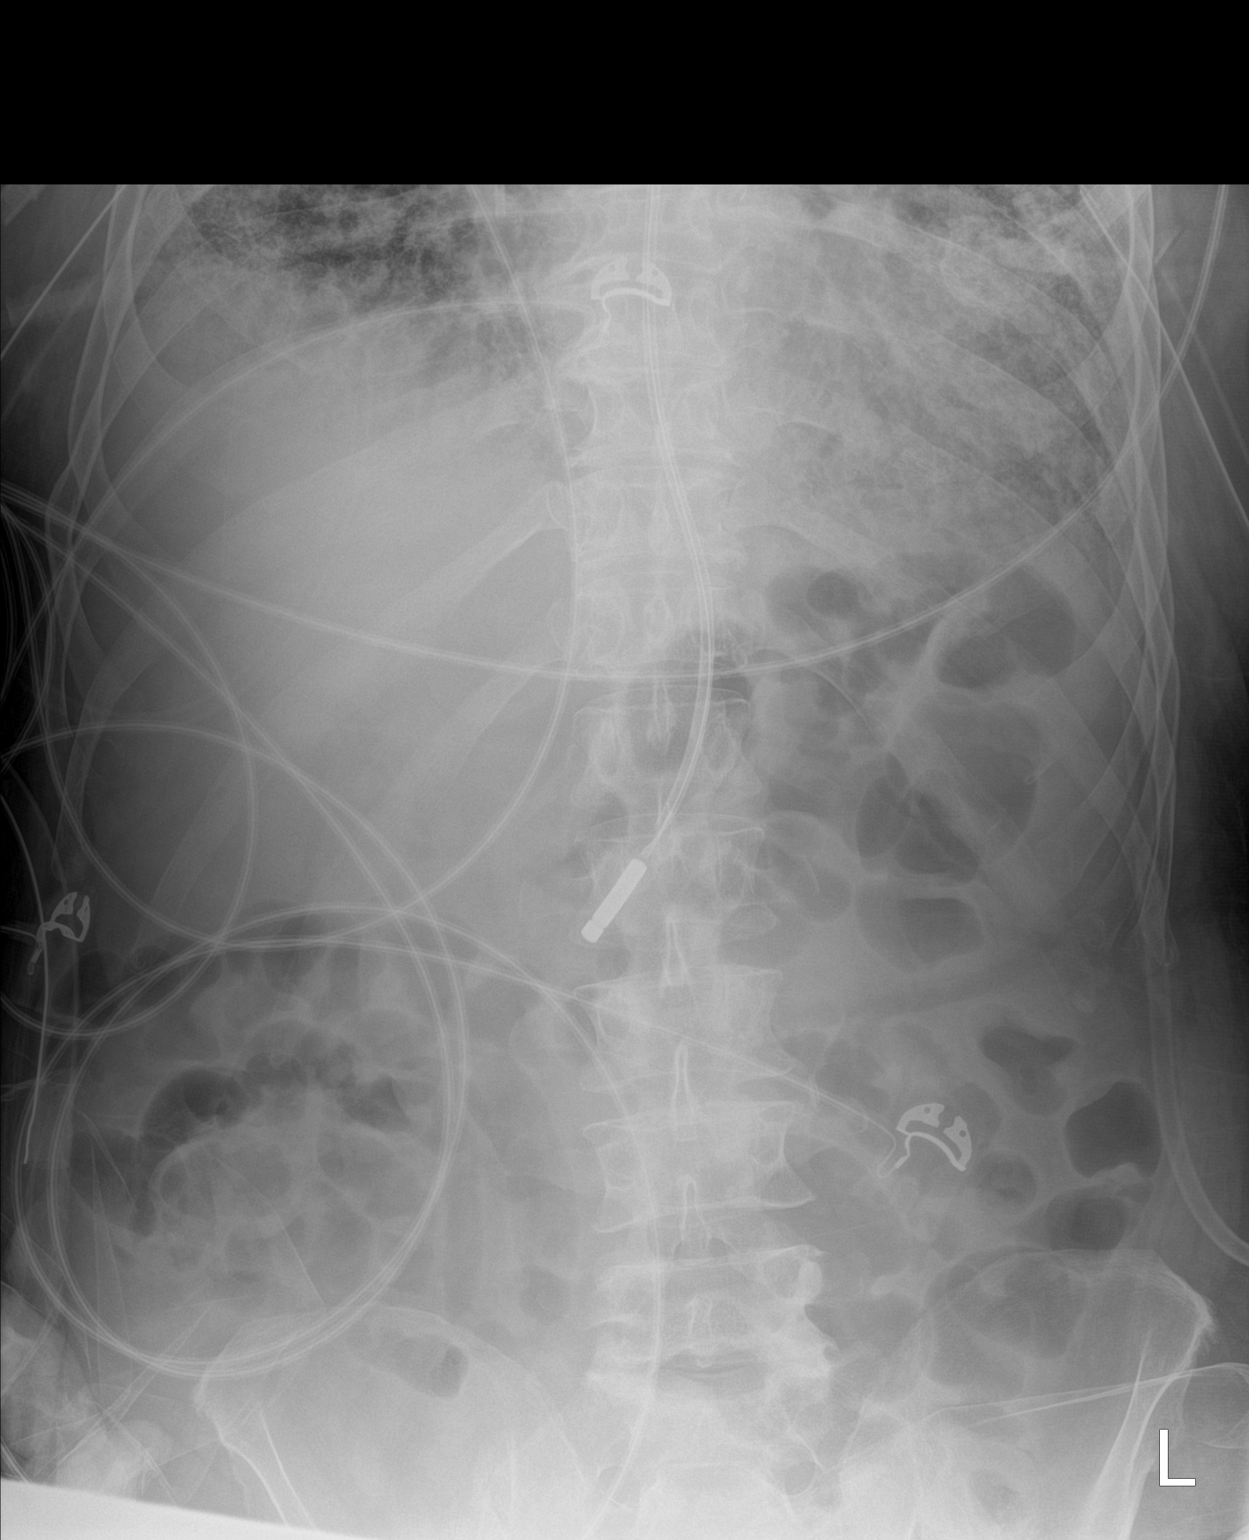

[1 of 1 positions shown; findings below may reference images not displayed]

FINDINGS: Feeding tube is in place with the tip in the stomach. Nonobstructive
bowel gas pattern.
IMPRESSION: Feeding tube tip in the mid to distal stomach.

## 2022-02-04 IMAGING — CT CT CHEST W/O CM
2 of 3 series · 15 of 36 positions shown, 18 images · non-contrast
Comparison: CT chest 08/05/2019

CLINICAL DATA: Follow-up bilateral pneumothorax and
pneumomediastinum. History of [id number] pneumonia. Hodgkin's
lymphoma.

EXAM:
CT CHEST WITHOUT CONTRAST
TECHNIQUE: Multidetector CT imaging of the chest was performed following the
standard protocol without IV contrast.

[Series 2: thorax · axial · 0.78mm/px · z∈[+114,+354]mm · 12 of 142 slices shown, 15 images]
[im 11/142  mediastinal]
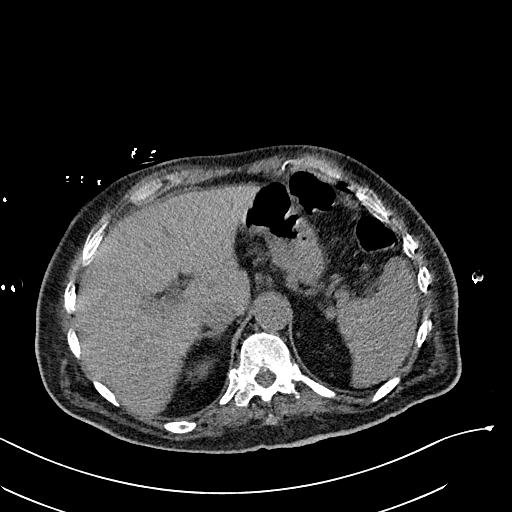
[im 11/142  lung]
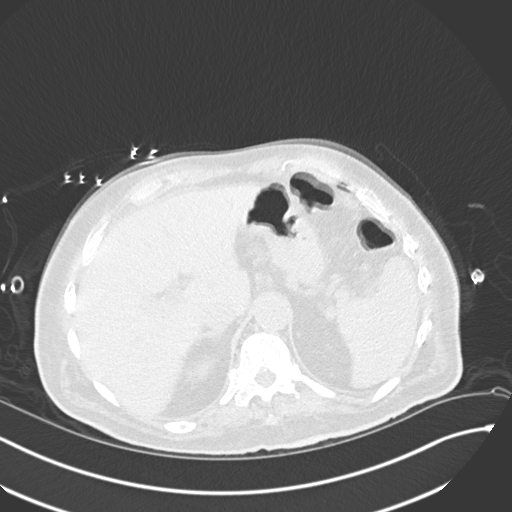
[im 21/142  lung]
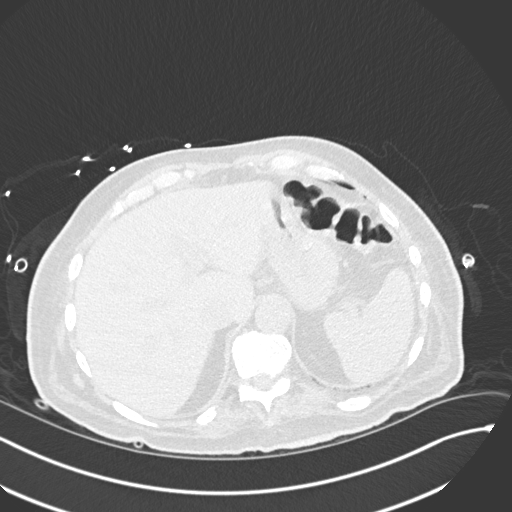
[im 32/142  lung]
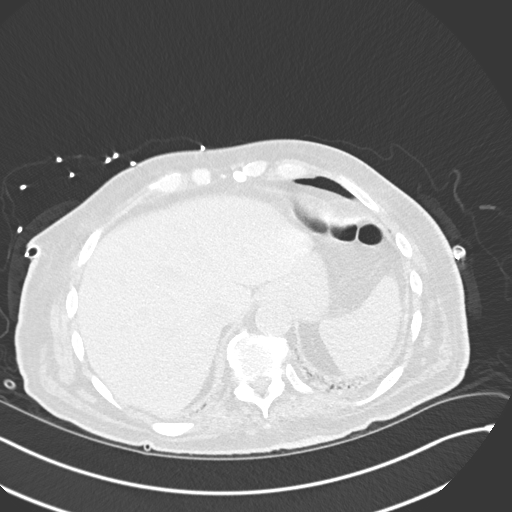
[im 42/142  lung]
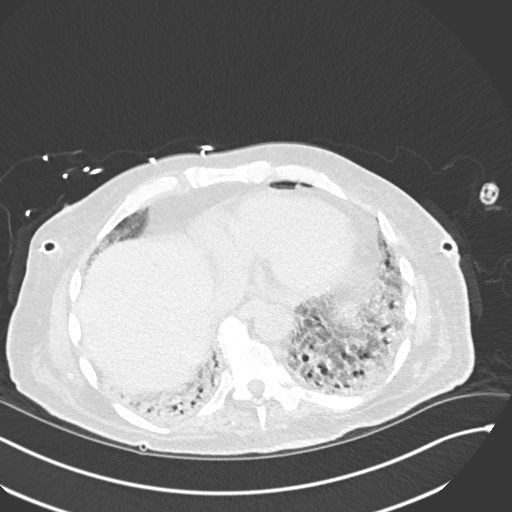
[im 53/142  mediastinal]
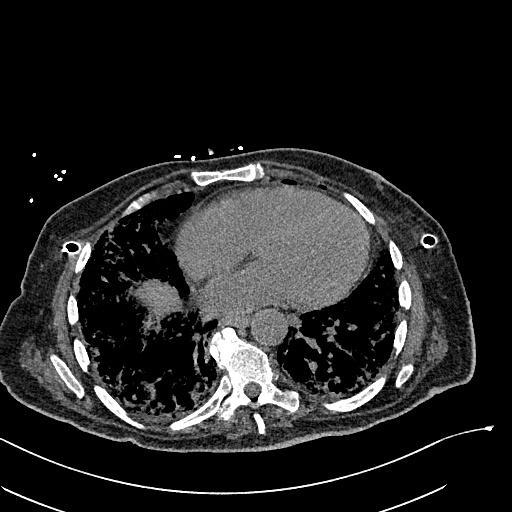
[im 53/142  lung]
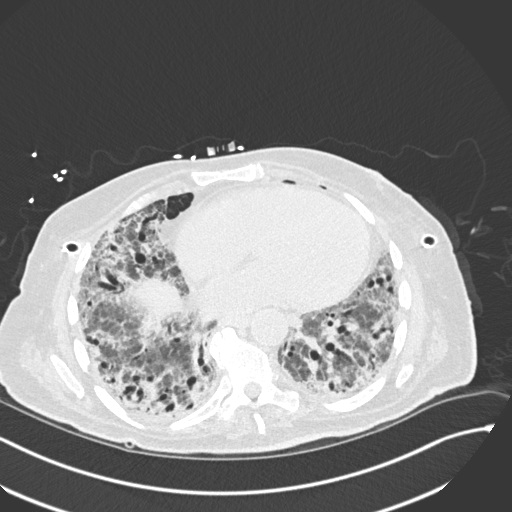
[im 63/142  lung]
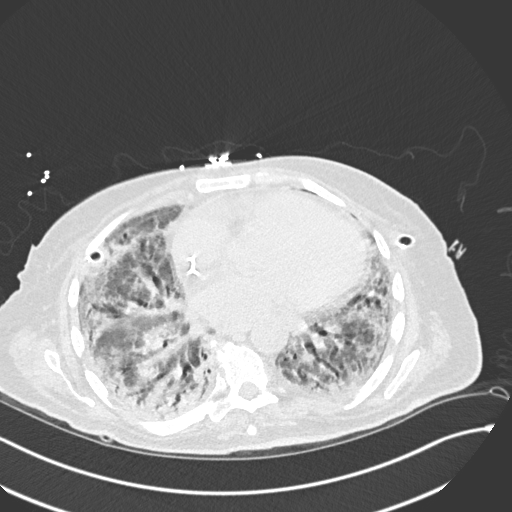
[im 79/142  lung]
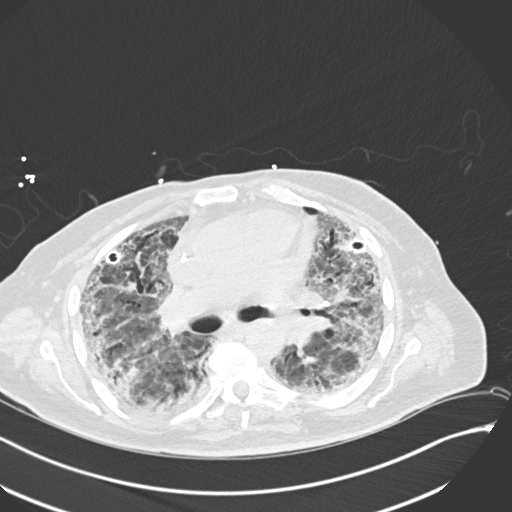
[im 89/142  lung]
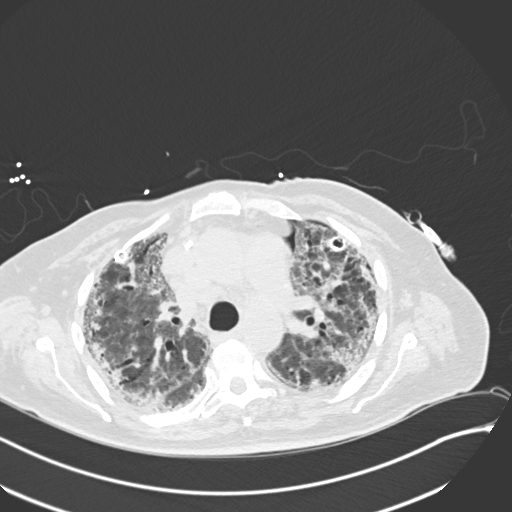
[im 100/142  mediastinal]
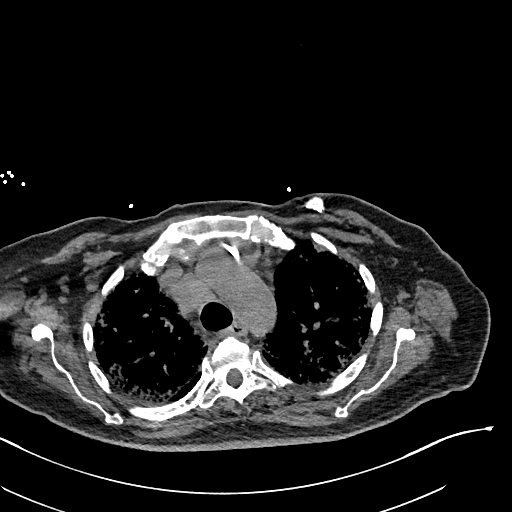
[im 100/142  lung]
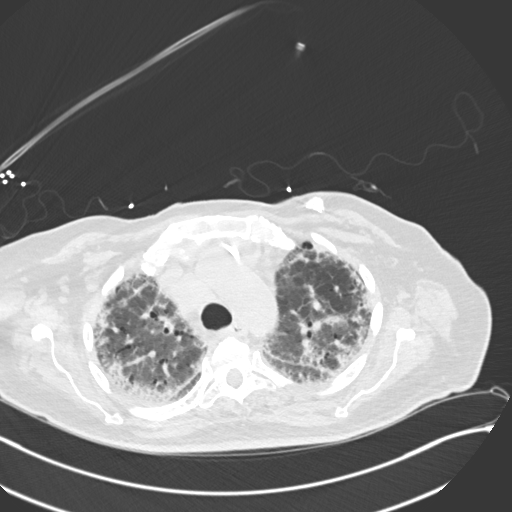
[im 110/142  lung]
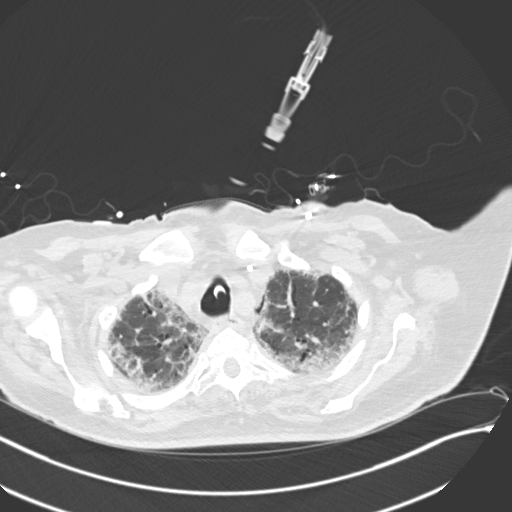
[im 121/142  lung]
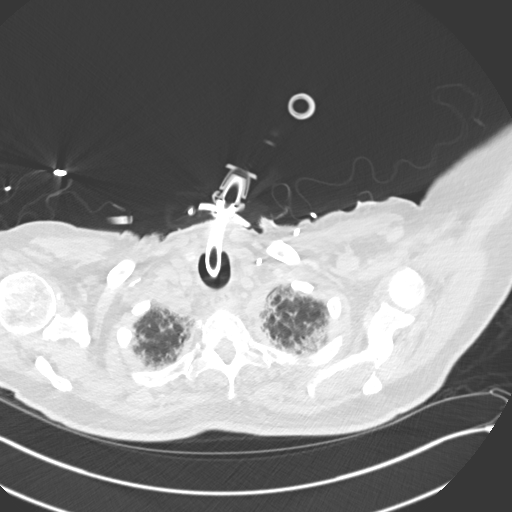
[im 131/142  lung]
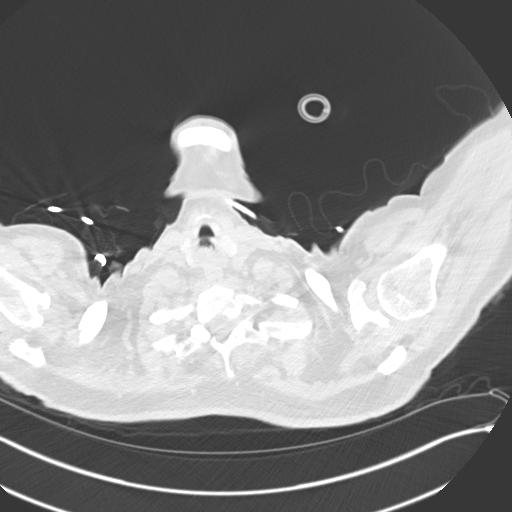

[Series 5: coronal · coronal · 0.56mm/px · 3 of 123 slices shown]
[im 25/123  lung]
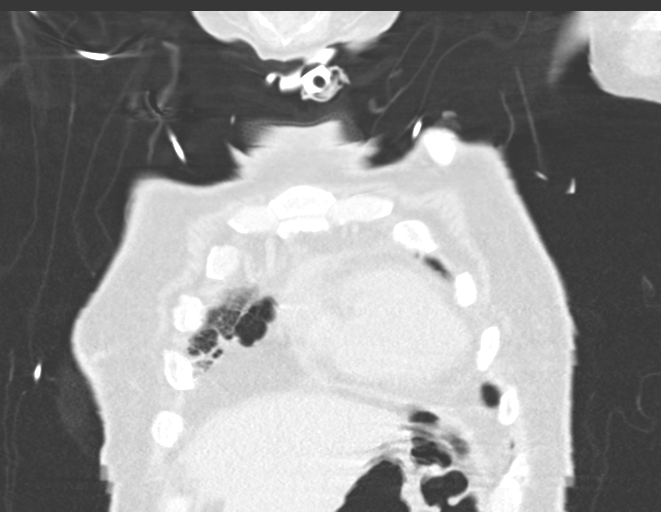
[im 49/123  lung]
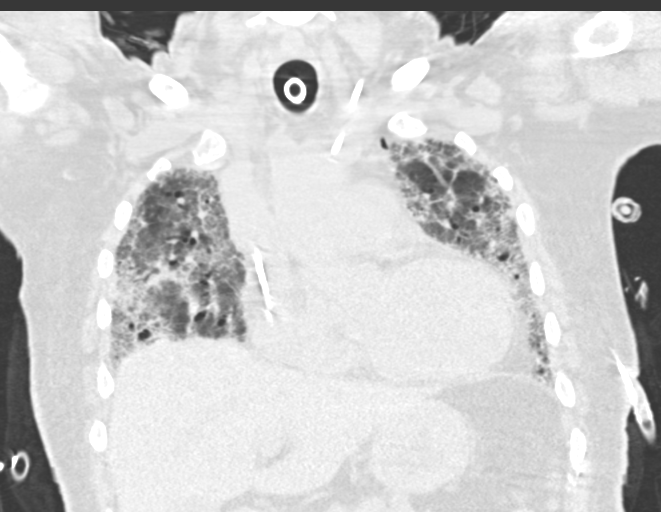
[im 74/123  lung]
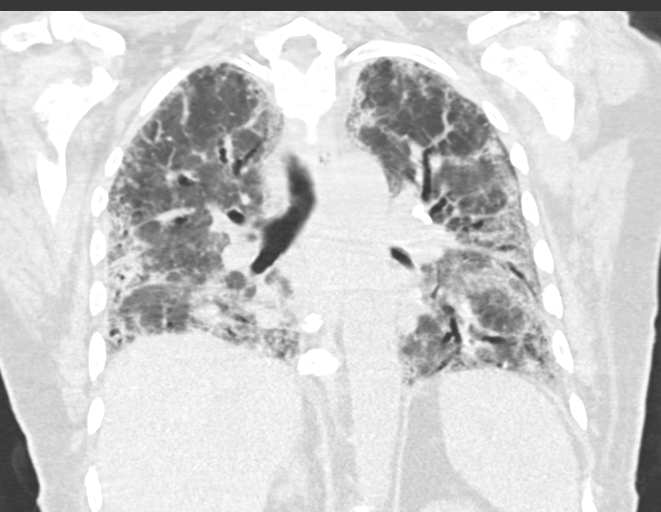

[15 of 36 positions shown; findings below may reference images not displayed]

FINDINGS: Cardiovascular: Mild cardiac enlargement. Aortic atherosclerosis. No
pericardial effusion.

Mediastinum/Nodes: Tracheostomy tube tip is in satisfactory position
above the carina. Right paratracheal adenopathy is identified. High
right paratracheal lymph node measures 2.2 cm, unchanged from
previous exam. No axillary or supraclavicular adenopathy.

Significant interval reduction in volume of previously noted marked
pneumomediastinum. No significant posterior mediastinal gas
identified particularly in the region of the esophagus

Lungs/Pleura: Bilateral chest tubes remain in place. The right-sided
pneumothorax has resolved. Small left sided pneumothorax overlies
the anterior and medial left upper lobe.

Severe, progressive fibrotic interstitial lung disease is again
noted including diffuse cylindrical bronchiectasis, interstitial
reticulation, peripheral subpleural consolidation and ground-glass
attenuation.

Upper Abdomen: No acute abnormality.

Musculoskeletal: Thoracic spondylosis. No acute or suspicious bone
lesions.
IMPRESSION: 1. Significant interval reduction in volume of previously noted
marked pneumomediastinum. Subcutaneous gas is also resolved in the
interval.
2. Bilateral chest tubes remain in place. Right pneumothorax has
resolved. Small left sided pneumothorax remains.
3. Severe, progressive fibrotic interstitial lung changes.
4. Stable mediastinal adenopathy.
5. Aortic atherosclerosis.

Aortic Atherosclerosis ([id number]).

These results were called by telephone at the time of interpretation
on 08/20/2019 at [DATE] to provider MDIMAM DAMODARAN , who verbally
acknowledged these results.

## 2022-02-06 IMAGING — DX DG CHEST 1V PORT
1 series · 1 of 1 positions shown · non-contrast
Comparison: August 19, 2019.

CLINICAL DATA: ARDS.

EXAM:
PORTABLE CHEST 1 VIEW

[chest ap]
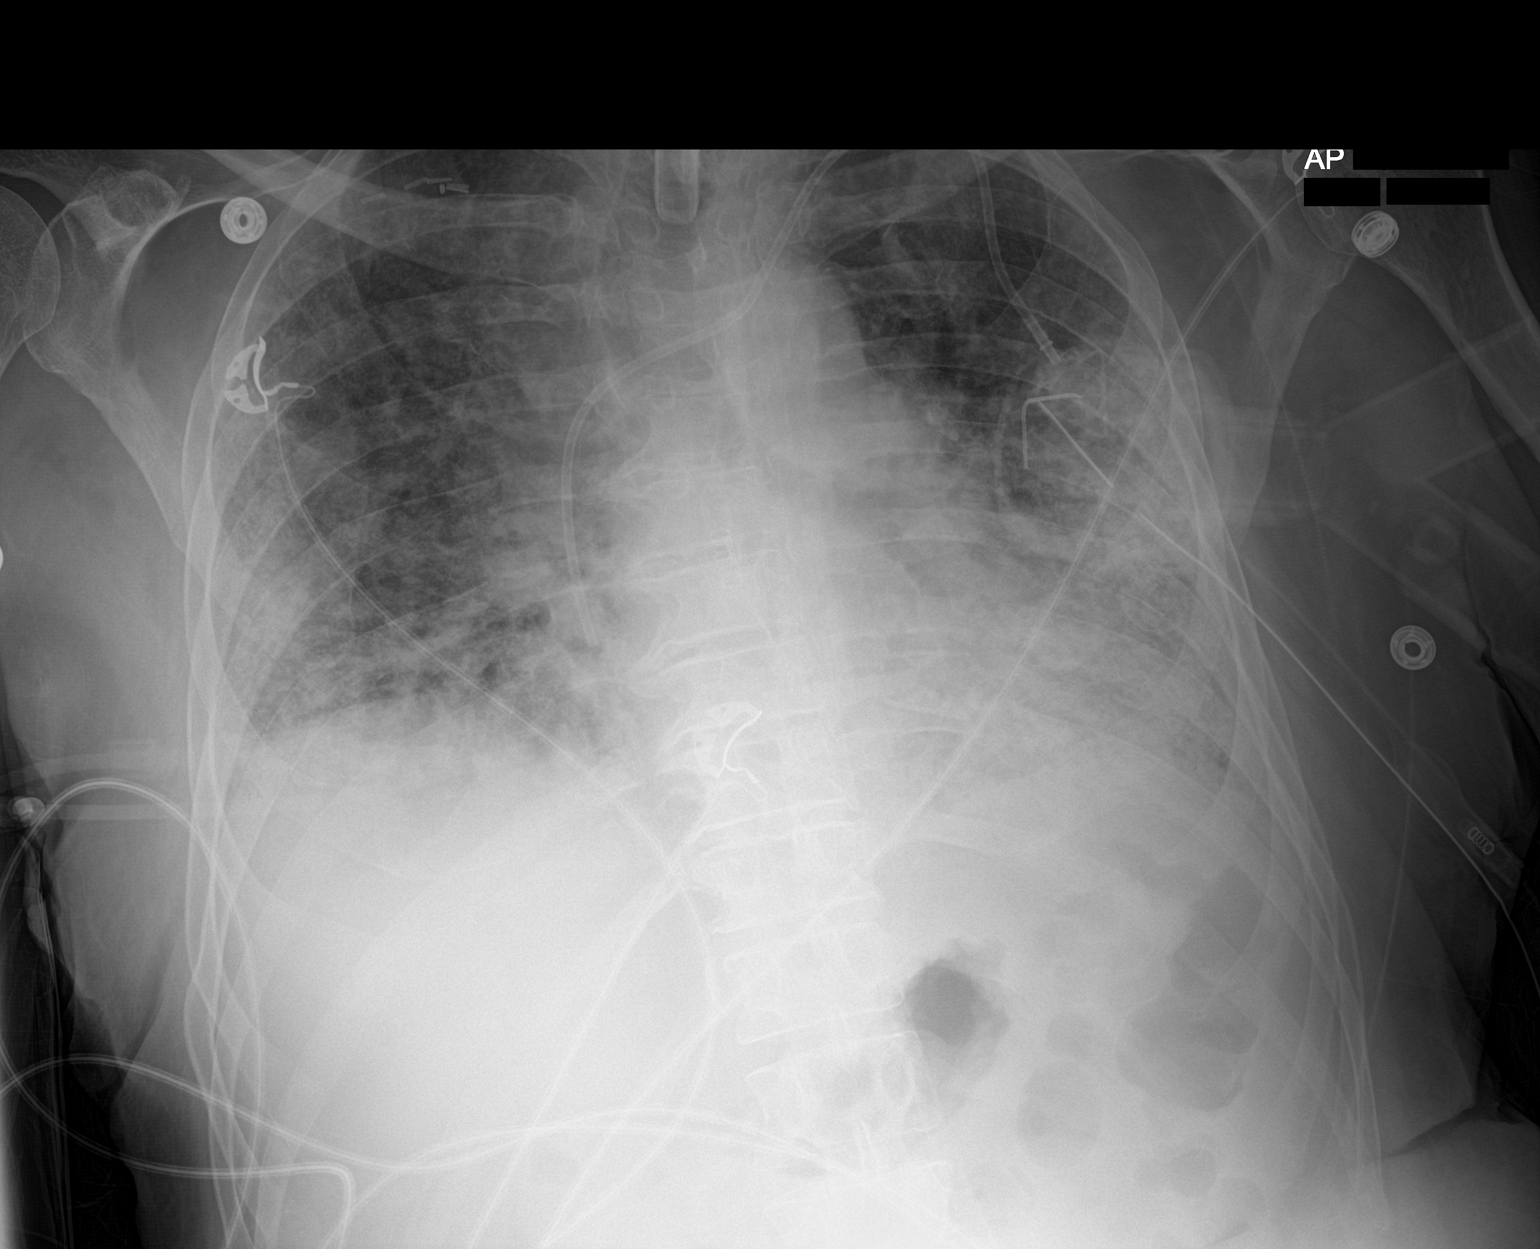

[1 of 1 positions shown; findings below may reference images not displayed]

FINDINGS: Stable cardiomediastinal silhouette. Left-sided chest tube is noted
without pneumothorax. Tracheostomy tube is unchanged. Stable
position of left internal jugular Port-A-Cath. Stable bilateral lung
opacities are noted concerning for multifocal pneumonia. Bony thorax
is unremarkable.
IMPRESSION: Left-sided chest tube is noted without pneumothorax. Stable
bilateral lung opacities are noted concerning for multifocal
pneumonia.

## 2022-02-09 IMAGING — DX DG CHEST 1V PORT
1 series · 1 of 1 positions shown · non-contrast
Comparison: Chest x-rays dated 08/22/2019 and 08/19/2019.

CLINICAL DATA: Pneumothorax on the LEFT.

EXAM:
PORTABLE CHEST 1 VIEW

[chest ap]
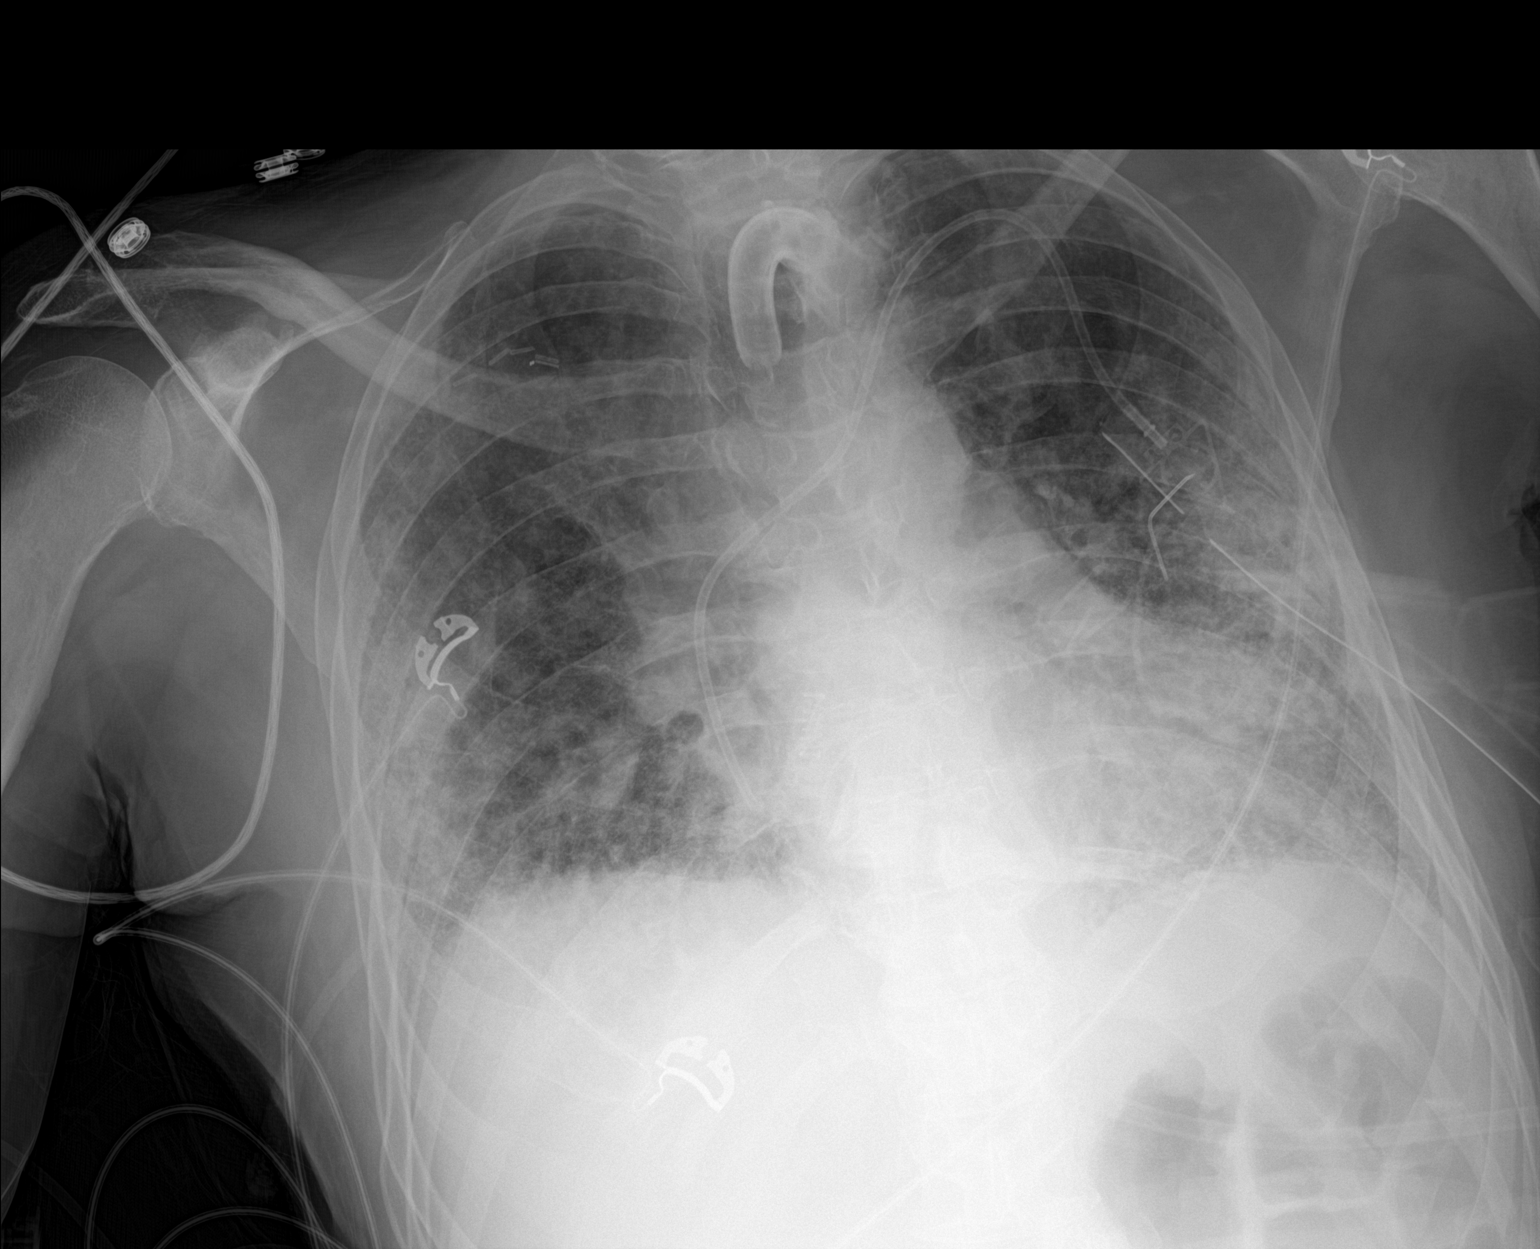

[1 of 1 positions shown; findings below may reference images not displayed]

FINDINGS: LEFT-sided chest tube appears well positioned with tip at the level
of the LEFT mid lung. Tiny residual pneumothorax at the LEFT lung
apex.

Patchy bilateral airspace opacities are stable. No new lung
findings. No pleural effusion is seen. Heart size and mediastinal
contours are stable. Tracheostomy tube appropriately positioned in
the midline. LEFT subclavian central line well positioned with tip
at the level of the lower SVC/cavoatrial junction.
IMPRESSION: 1. Tiny residual pneumothorax at the LEFT lung apex. LEFT-sided
chest tube is well positioned.
2. Stable patchy bilateral airspace opacities, compatible with
multifocal pneumonia.

## 2022-02-10 IMAGING — DX DG CHEST 1V PORT
1 series · 1 of 1 positions shown · non-contrast
Comparison: Yesterday

CLINICAL DATA: Pneumothorax follow-up

EXAM:
PORTABLE CHEST 1 VIEW

[chest ap]
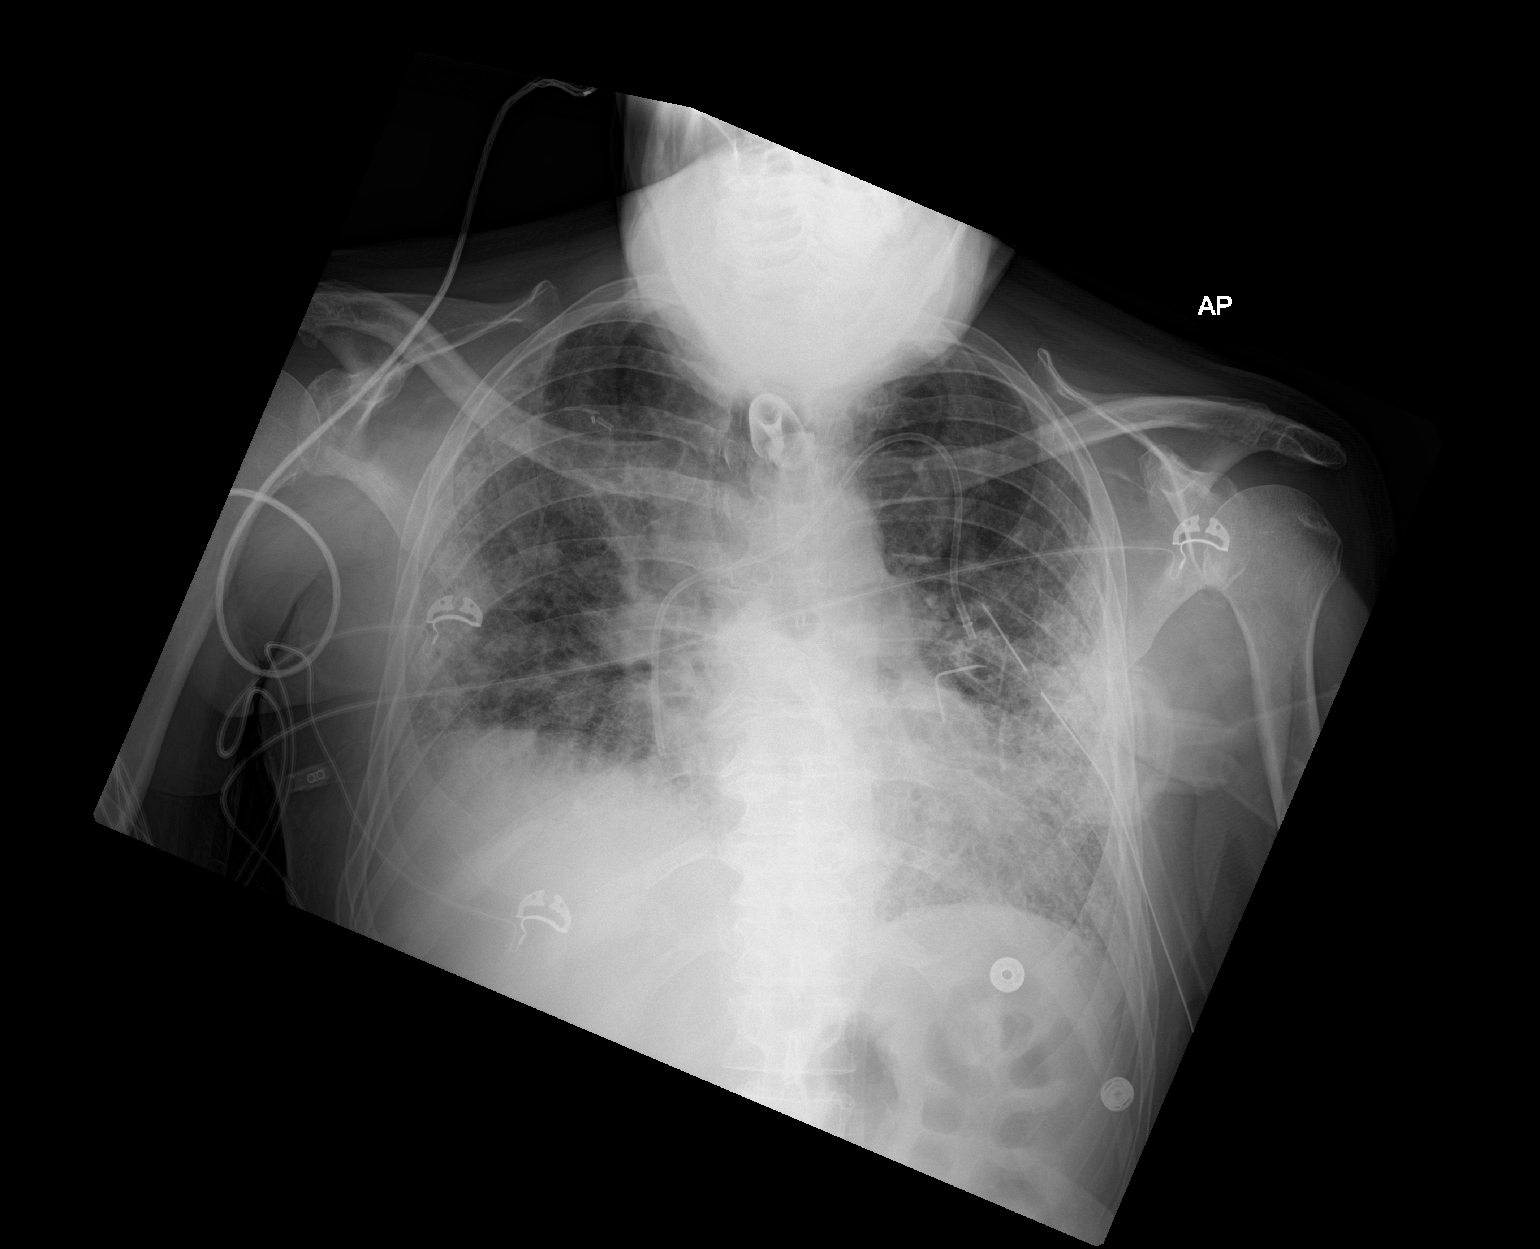

[1 of 1 positions shown; findings below may reference images not displayed]

FINDINGS: Tracheostomy tube in place. Left-sided chest tube in similar
position. No visible pneumothorax, limited by chin positioning. Left
port with tip at the right atrium.

Low volume chest with interstitial coarsening on both sides from
fibrosis by CT.
IMPRESSION: 1. No visible pneumothorax today, but limited by chin positioning
over the apex.
2. Pulmonary fibrosis without acute opacity.

## 2022-02-11 IMAGING — DX DG CHEST 1V PORT
1 series · 1 of 1 positions shown · non-contrast
Comparison: August 26, 2019.

CLINICAL DATA: Pneumothorax.

EXAM:
PORTABLE CHEST 1 VIEW

[chest ap]
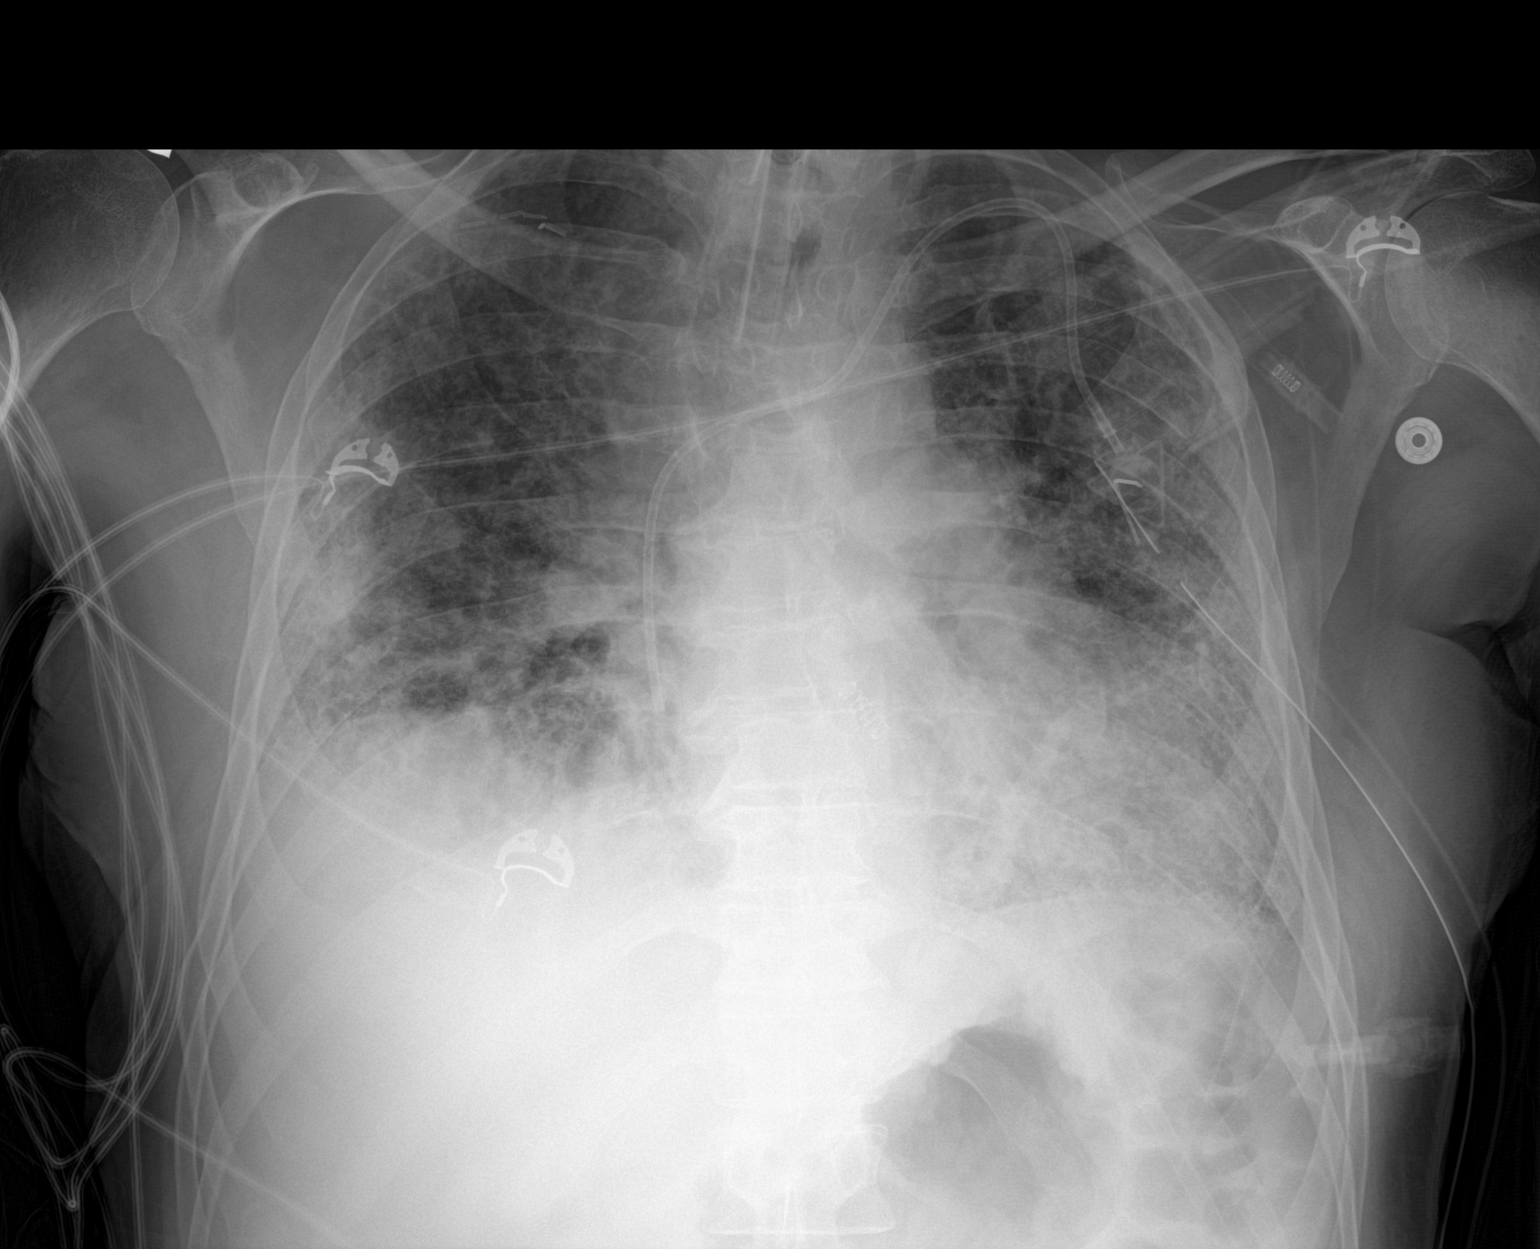

[1 of 1 positions shown; findings below may reference images not displayed]

FINDINGS: Stable cardiomegaly. Tracheostomy tube is in good position. Left
internal jugular Port-A-Cath is unchanged in position. No
pneumothorax or significant pleural effusion is noted. Left-sided
chest tube is noted without pneumothorax. Stable bilateral lung
opacities are noted consistent with a history of pulmonary fibrosis,
although acute superimposed inflammation or edema cannot be
excluded. Bony thorax is unremarkable.
IMPRESSION: Left-sided chest tube is noted without pneumothorax. Stable
bilateral lung opacities are noted consistent with a history of
pulmonary fibrosis, although acute superimposed inflammation or
edema cannot be excluded.

## 2022-02-12 IMAGING — DX DG CHEST 1V PORT
1 series · 1 of 1 positions shown · non-contrast
Comparison: Chest x-ray dated August 28, 2019

CLINICAL DATA: Chest tube in place.  Peg tube.

EXAM:
PORTABLE CHEST 1 VIEW

[chest]
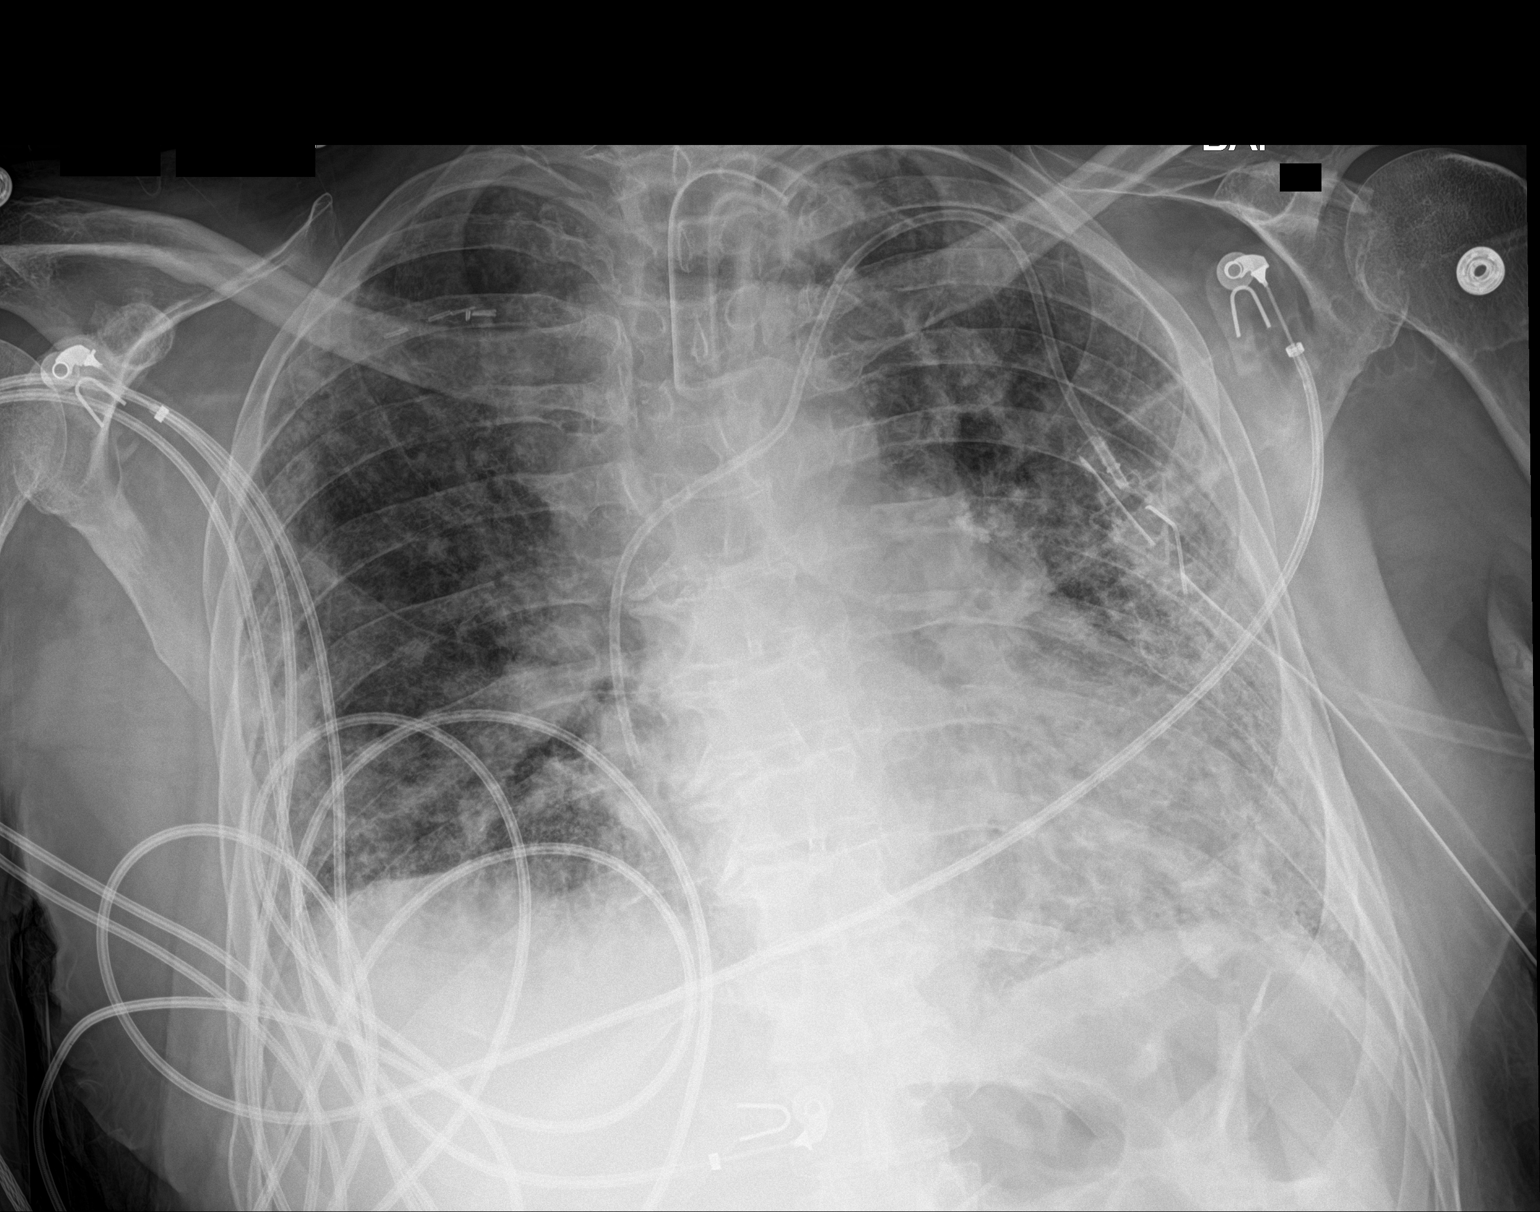

[1 of 1 positions shown; findings below may reference images not displayed]

FINDINGS: The tracheostomy tube terminates above the carina. There is a
left-sided subclavian Port-A-Cath in place. The positioning is
stable. Diffuse hazy bilateral airspace opacities are again noted.
There is no pneumothorax. There may be small bilateral pleural
effusions. There is no acute osseous abnormality. There is a
left-sided chest tube in place.
IMPRESSION: 1. Lines and tubes as above.  No pneumothorax.
2. Stable appearance of the chest.

## 2022-02-12 IMAGING — DX DG ABDOMEN 1V
1 series · 1 of 1 positions shown · non-contrast
Comparison: KUB 08/20/2019 and earlier.

CLINICAL DATA: 63-year-old male status post endoscopic PEG tube
placement.

EXAM:
ABDOMEN - 1 VIEW

[abdomen]
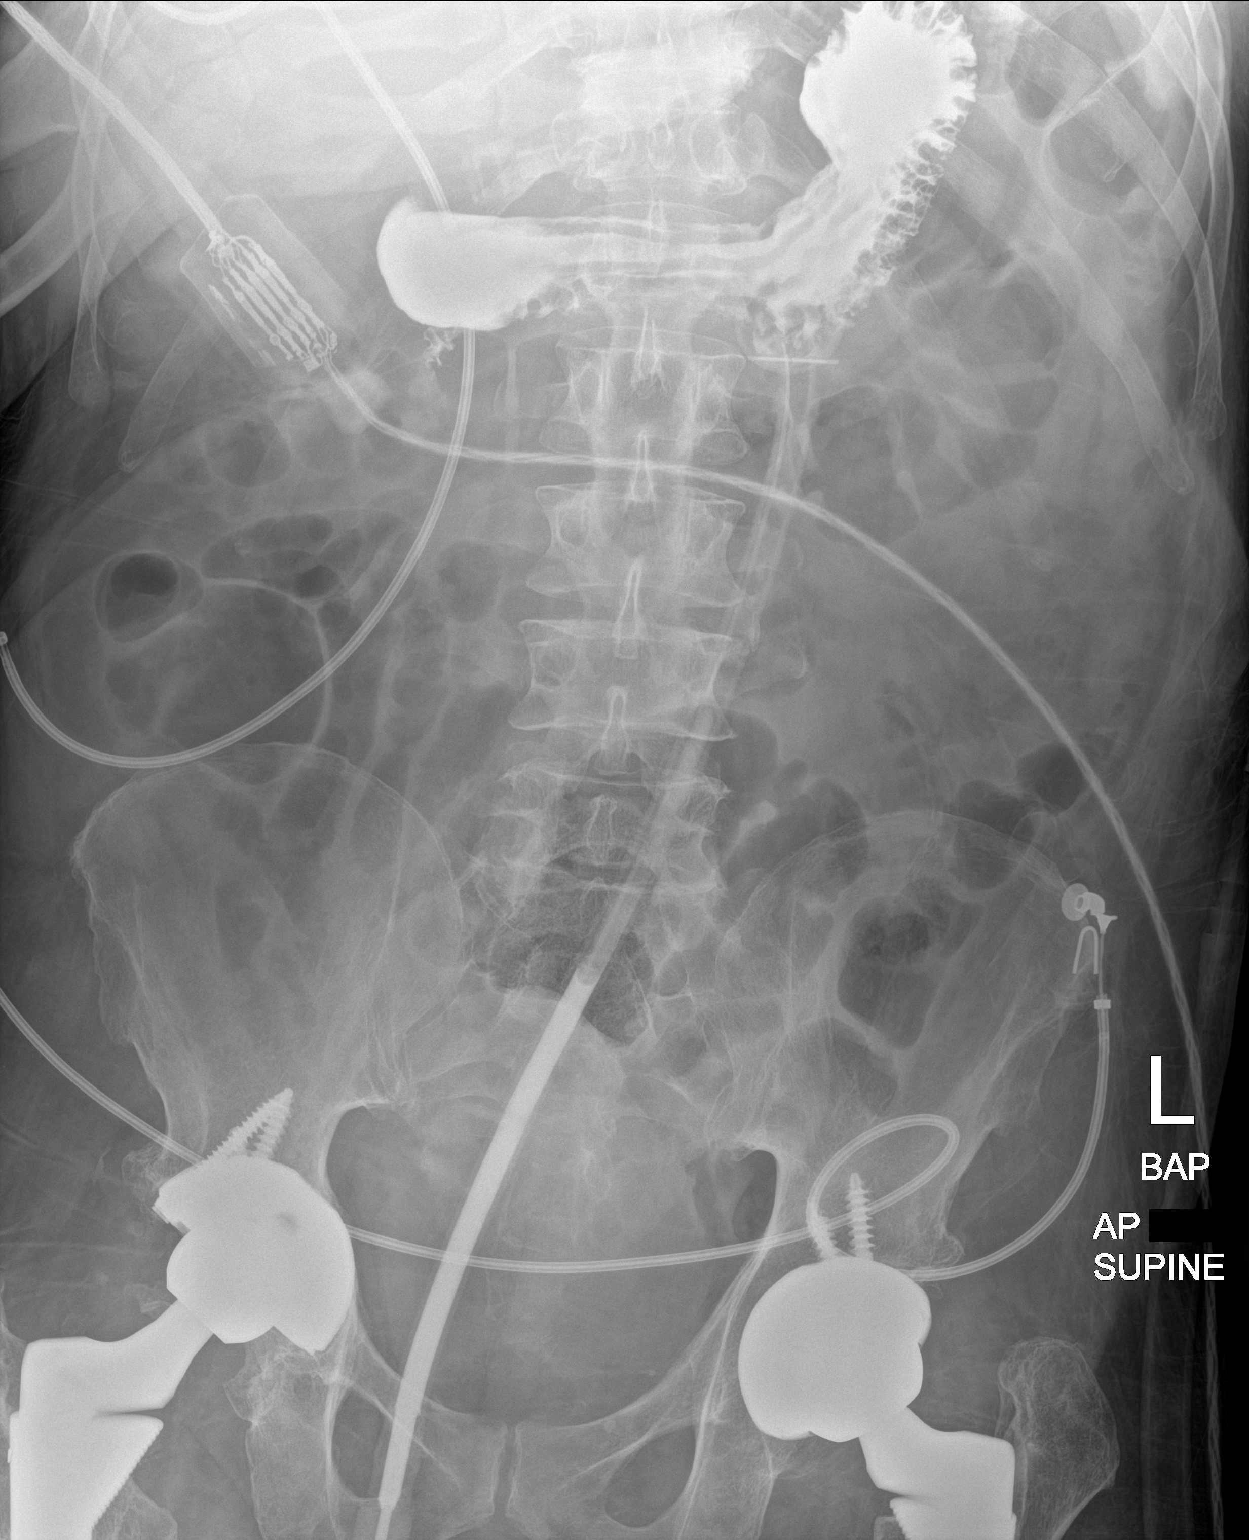

[1 of 1 positions shown; findings below may reference images not displayed]

FINDINGS: 50 mL of Omnipaque 300 contrast was injected via the gastrostomy
tube prior to this image. Portable AP supine view at 2440 hours.
Enteric feeding tube appears to be removed. Normal contrast filling
of the stomach and early contrast extension to the duodenum. No
adverse features. Visible bowel gas pattern elsewhere is stable.
Previous bilateral total hip arthroplasty. No acute osseous
abnormality identified.
IMPRESSION: PEG tube contrast injection with no adverse features.

## 2022-02-12 IMAGING — DX DG CHEST 1V
1 series · 1 of 1 positions shown · non-contrast
Comparison: August 27, 2019.

CLINICAL DATA: Pneumothorax.

EXAM:
CHEST  1 VIEW

[chest ap]
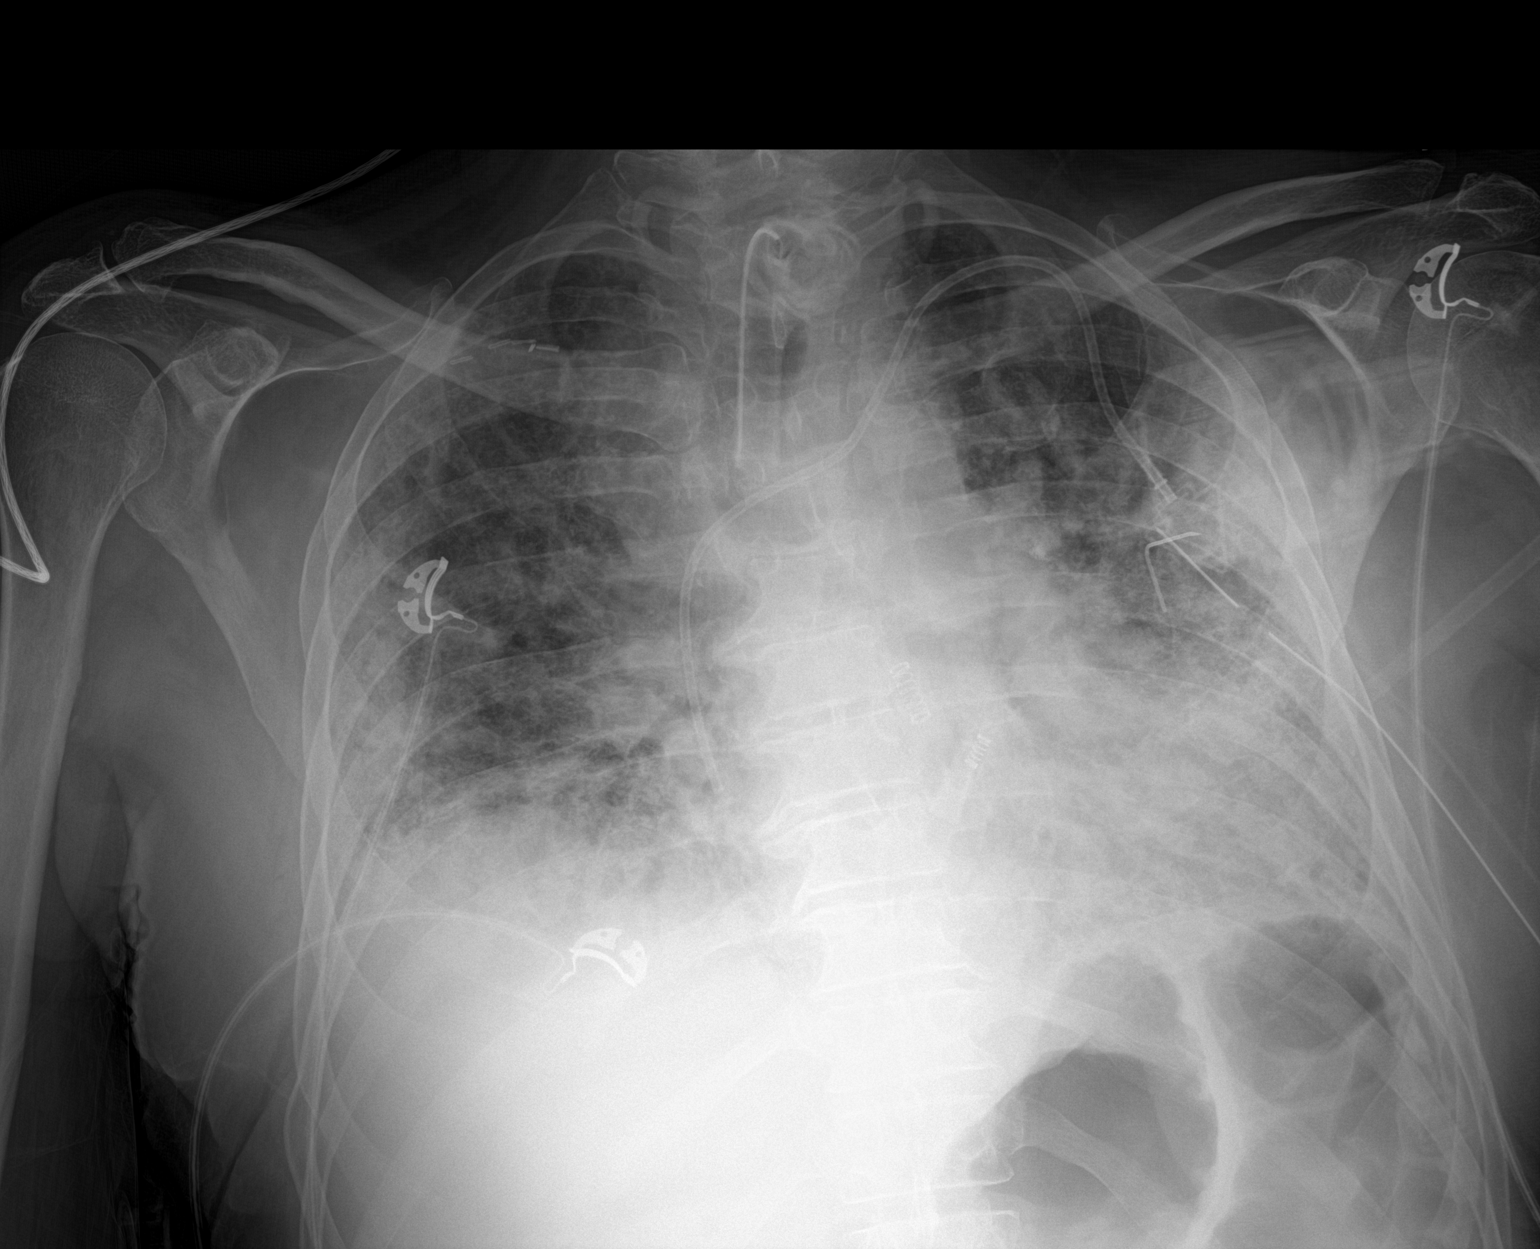

[1 of 1 positions shown; findings below may reference images not displayed]

FINDINGS: Stable cardiomegaly. Tracheostomy tube is in good position.
Left-sided Port-A-Cath is unchanged in position. Stable position of
left-sided chest tube without pneumothorax. Stable bilateral lung
opacities are noted most consistent with pulmonary fibrosis,
although superimposed inflammation cannot be excluded. Bony thorax
is unremarkable.
IMPRESSION: Stable bilateral lung opacities are noted most consistent with
pulmonary fibrosis, although superimposed inflammation cannot be
excluded. Stable position of left-sided chest tube without
pneumothorax.

## 2022-02-21 IMAGING — DX DG CHEST 1V PORT
1 series · 1 of 1 positions shown · non-contrast
Comparison: Portable exam 6694 hours compared to 09/02/2019 and
correlated with the prior study of 05/21/2019

CLINICAL DATA: Hypoxemia, LEFT side chest tube placement for
pneumothorax

EXAM:
PORTABLE CHEST 1 VIEW

[chest]
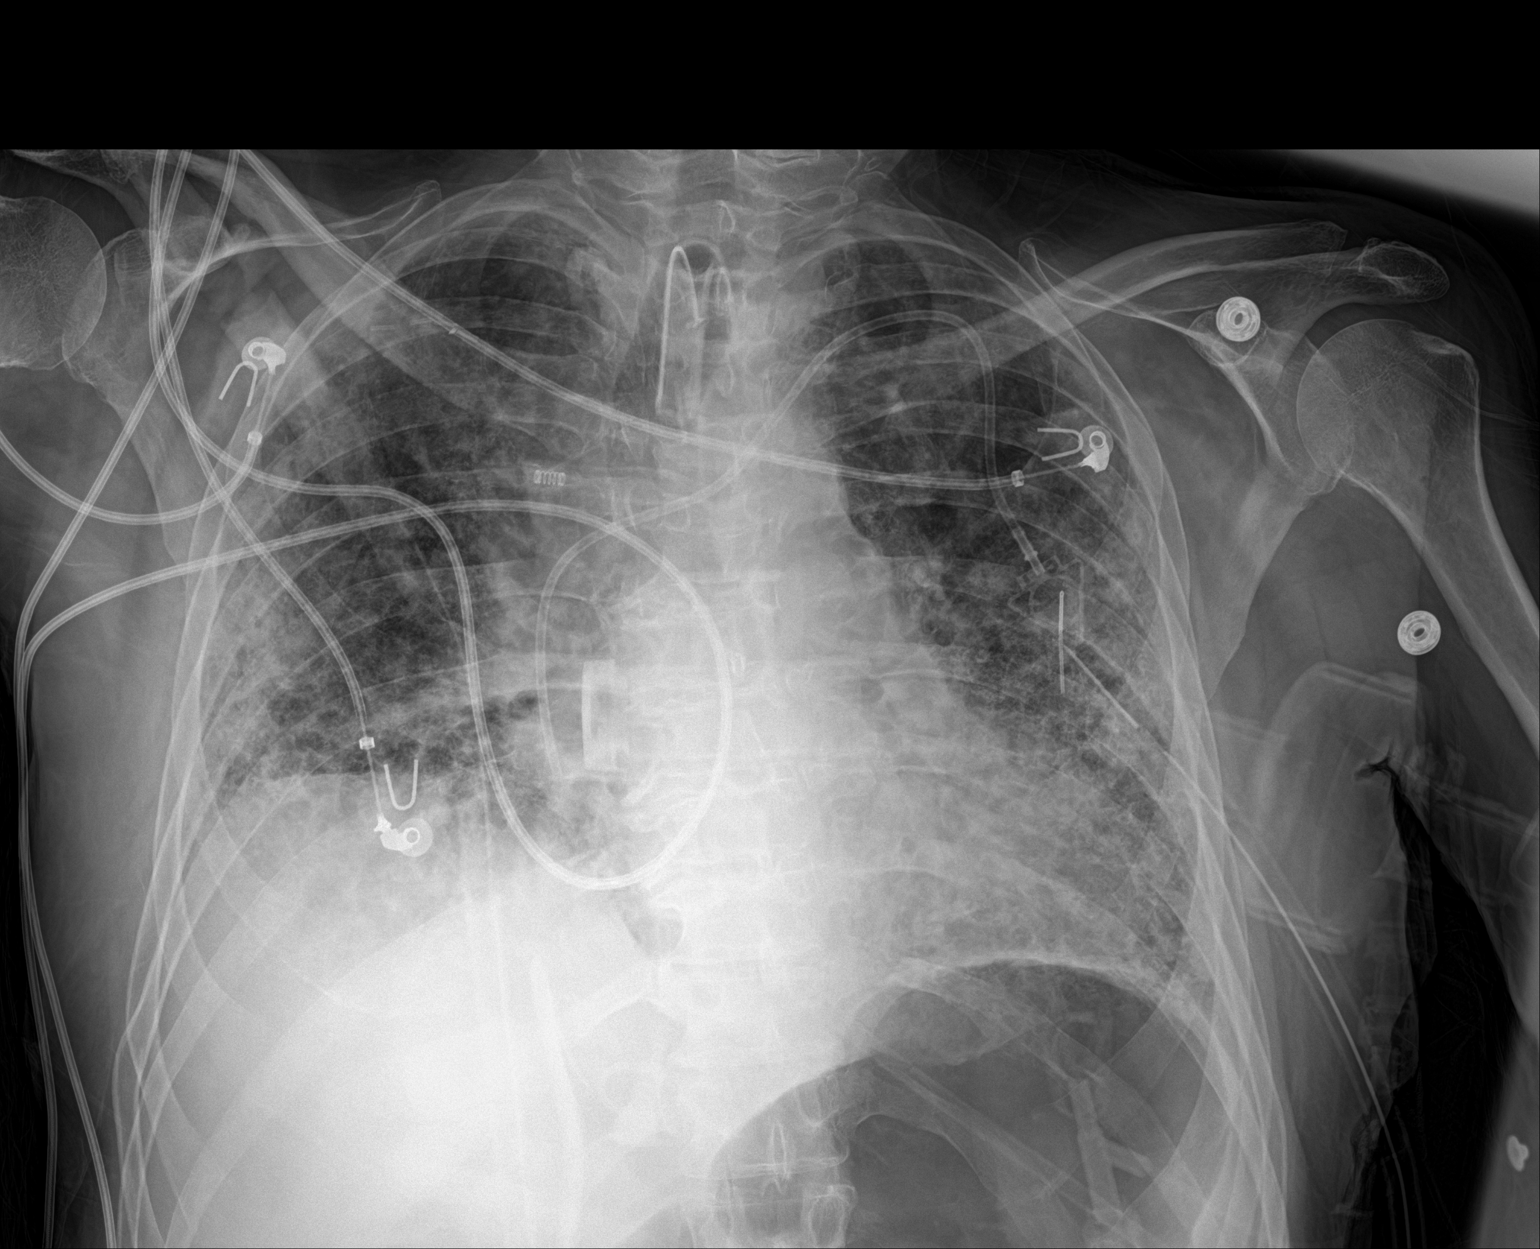

[1 of 1 positions shown; findings below may reference images not displayed]

FINDINGS: Tracheostomy tube, LEFT thoracostomy tube, and LEFT jugular
Port-A-Cath unchanged.

Stable heart size mediastinal contours.

BILATERAL pulmonary infiltrates, representing a combination of
underlying chronic interstitial lung disease with superimposed
pulmonary infiltrates.

No pleural effusion or pneumothorax.

Bones demineralized.
IMPRESSION: BILATERAL pulmonary infiltrates question multifocal pneumonia
superimposed upon a background of chronic interstitial lung
disease/pulmonary fibrosis.

## 2022-02-25 IMAGING — DX DG CHEST 1V PORT
1 series · 1 of 1 positions shown · non-contrast
Comparison: 09/06/2019.  07/31/2019.  CT 08/20/2019.

CLINICAL DATA: Pneumonia.

EXAM:
PORTABLE CHEST 1 VIEW

[chest ap]
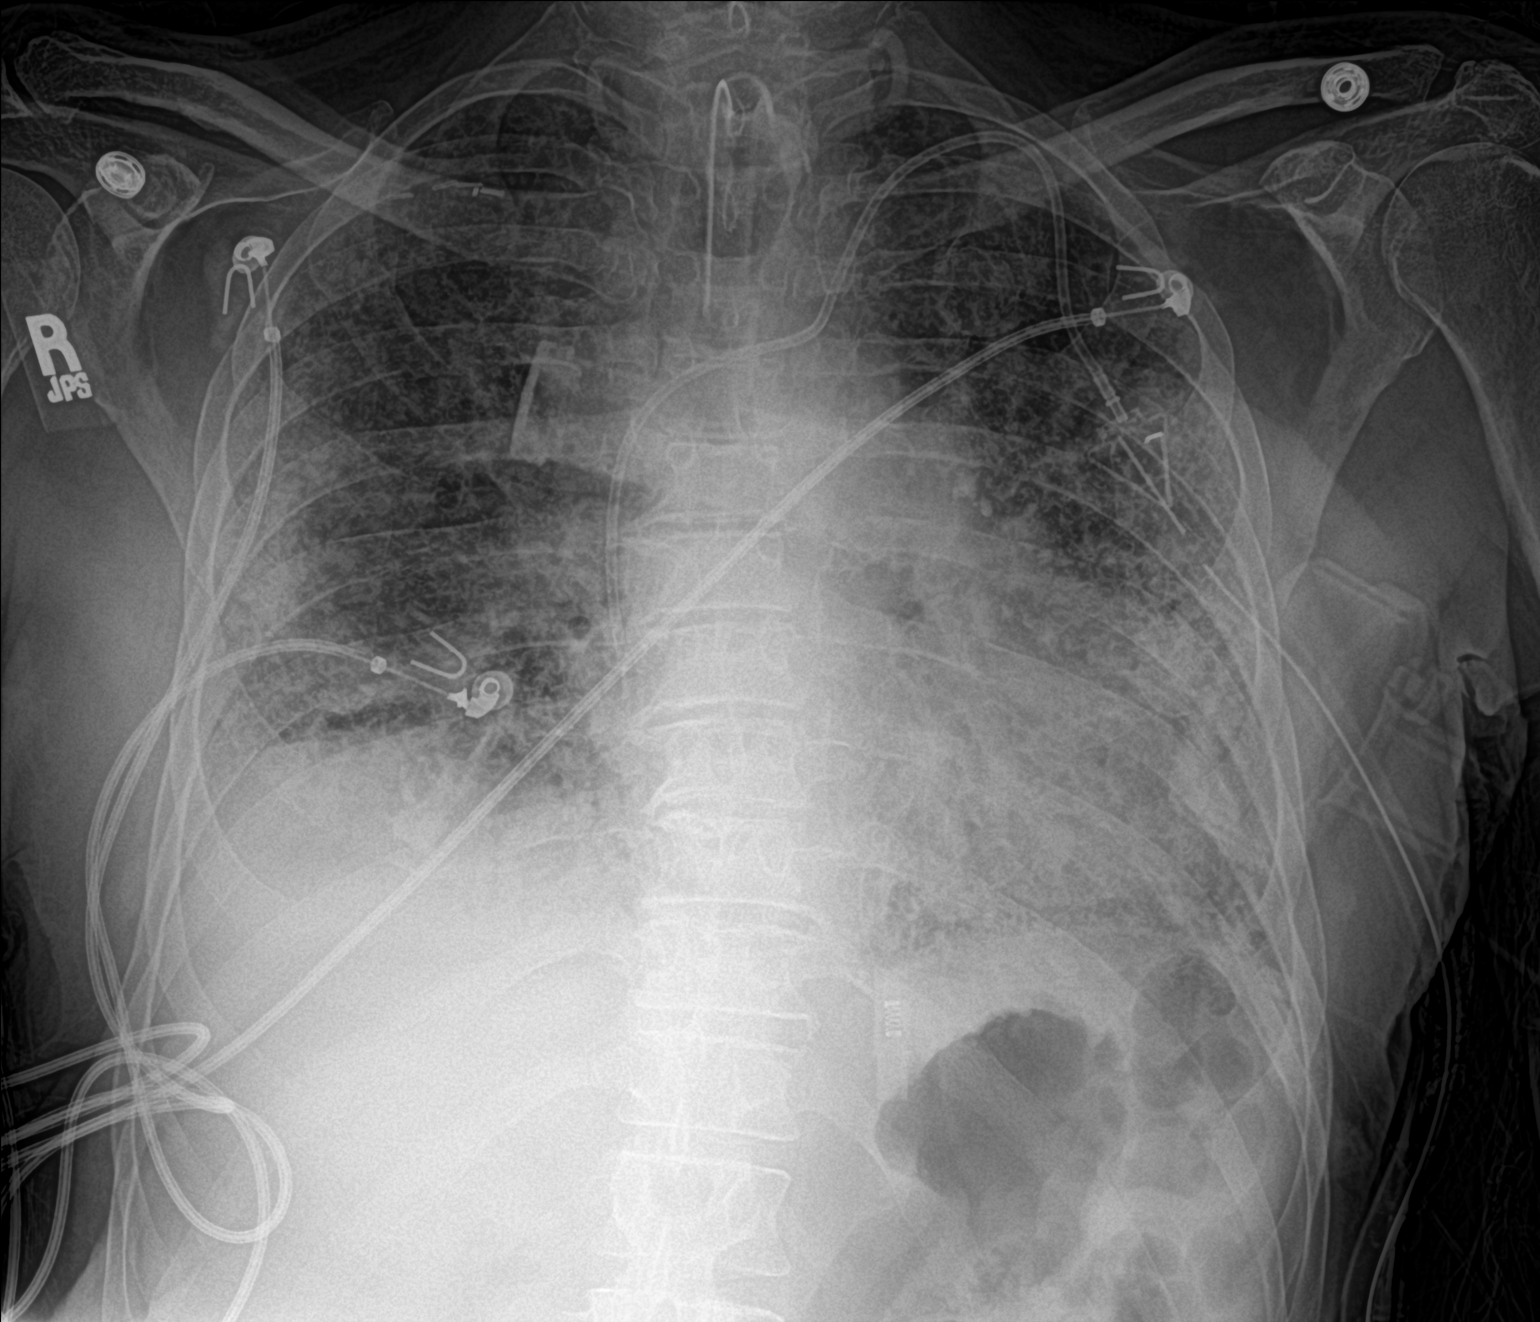

[1 of 1 positions shown; findings below may reference images not displayed]

FINDINGS: Tracheostomy tube, PowerPort catheter, left chest tube in stable
position. No pneumothorax. Stable cardiomegaly. Diffuse severe
bilateral pulmonary interstitial prominence again noted. Similar
findings noted on prior exam. Again chronic interstitial lung
disease with possible superimposed active interstitial process could
present in this fashion. No pleural effusion or pneumothorax.
IMPRESSION: 1. Lines and tubes including left chest tube in stable position. No
pneumothorax.

2. Diffuse severe bilateral pulmonary interstitial prominence again
noted. Similar findings noted on prior exams. Again chronic
interstitial lung disease with possible superimposed active
interstitial process could present this fashion.

## 2022-02-28 IMAGING — DX DG CHEST 1V PORT
1 series · 1 of 1 positions shown · non-contrast
Comparison: September 10, 2019

CLINICAL DATA: Pneumonia

EXAM:
PORTABLE CHEST 1 VIEW

[chest ap]
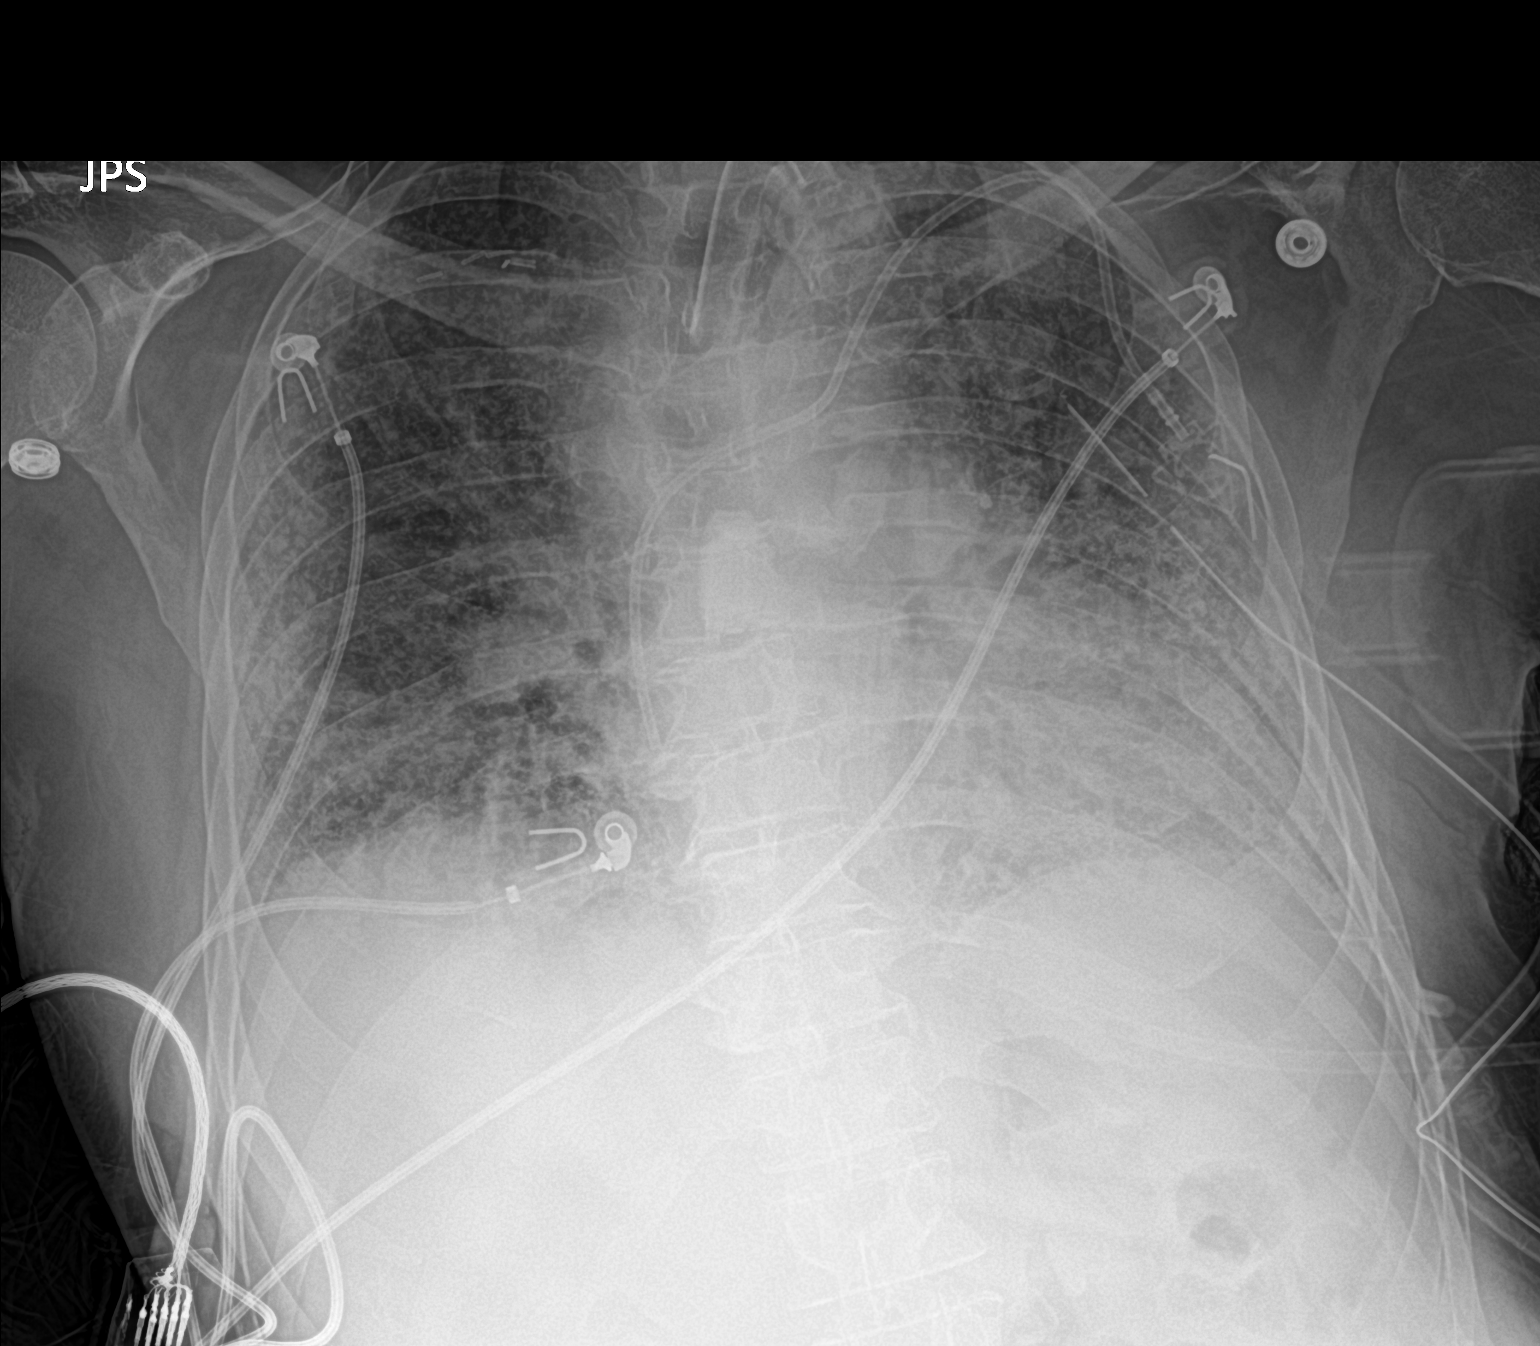

[1 of 1 positions shown; findings below may reference images not displayed]

FINDINGS: Again noted is tracheostomy tube and a left-sided central venous
catheter in unchanged position. A left-sided chest tube is seen.
Multifocal patchy airspace opacities are seen throughout both lungs
with diffusely increased interstitial markings. Consolidation is
seen. No acute osseous abnormality.
IMPRESSION: Lines and tubes in unchanged position.

Stable examination with diffuse bilateral interstitial and patchy
airspace opacities.

## 2022-03-02 IMAGING — DX DG CHEST 1V PORT
1 series · 1 of 1 positions shown · non-contrast
Comparison: Portable exam 5157 hours compared to 09/13/2019

CLINICAL DATA: Chest tube removal

EXAM:
PORTABLE CHEST 1 VIEW

[chest]
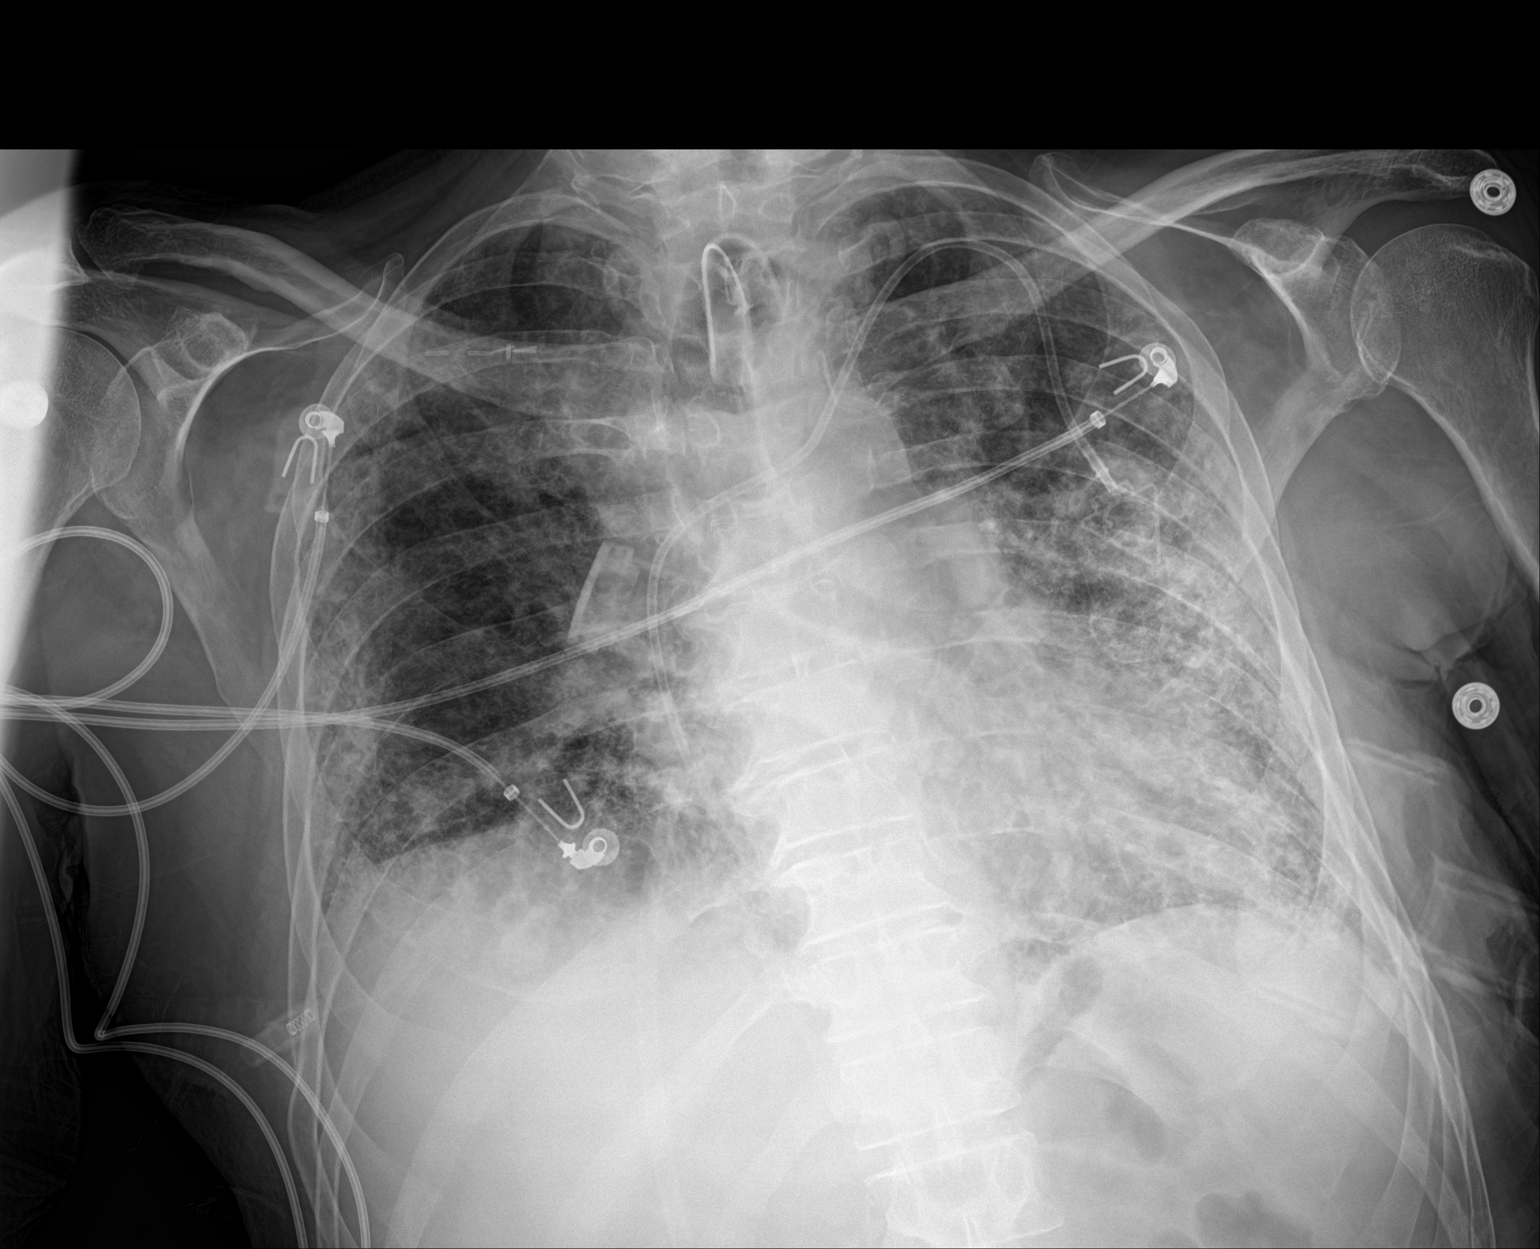

[1 of 1 positions shown; findings below may reference images not displayed]

FINDINGS: Tracheostomy tube and LEFT jugular Port-A-Cath unchanged.

Interval removal of LEFT thoracostomy tube.

Stable heart size and mediastinal contours.

Asymmetric pulmonary infiltrates LEFT greater than RIGHT, with
slightly improved aeration in RIGHT lung since previous study.

No pleural effusion or pneumothorax.
IMPRESSION: No pneumothorax following chest tube removal.

Asymmetric pulmonary infiltrates LEFT greater than RIGHT, slightly
improved on RIGHT since previous exam.

## 2022-03-03 IMAGING — CR DG CHEST 1V
1 series · 1 of 1 positions shown · non-contrast
Comparison: 09/15/2018

CLINICAL DATA: Respiratory failure

EXAM:
CHEST  1 VIEW

[chest ap]
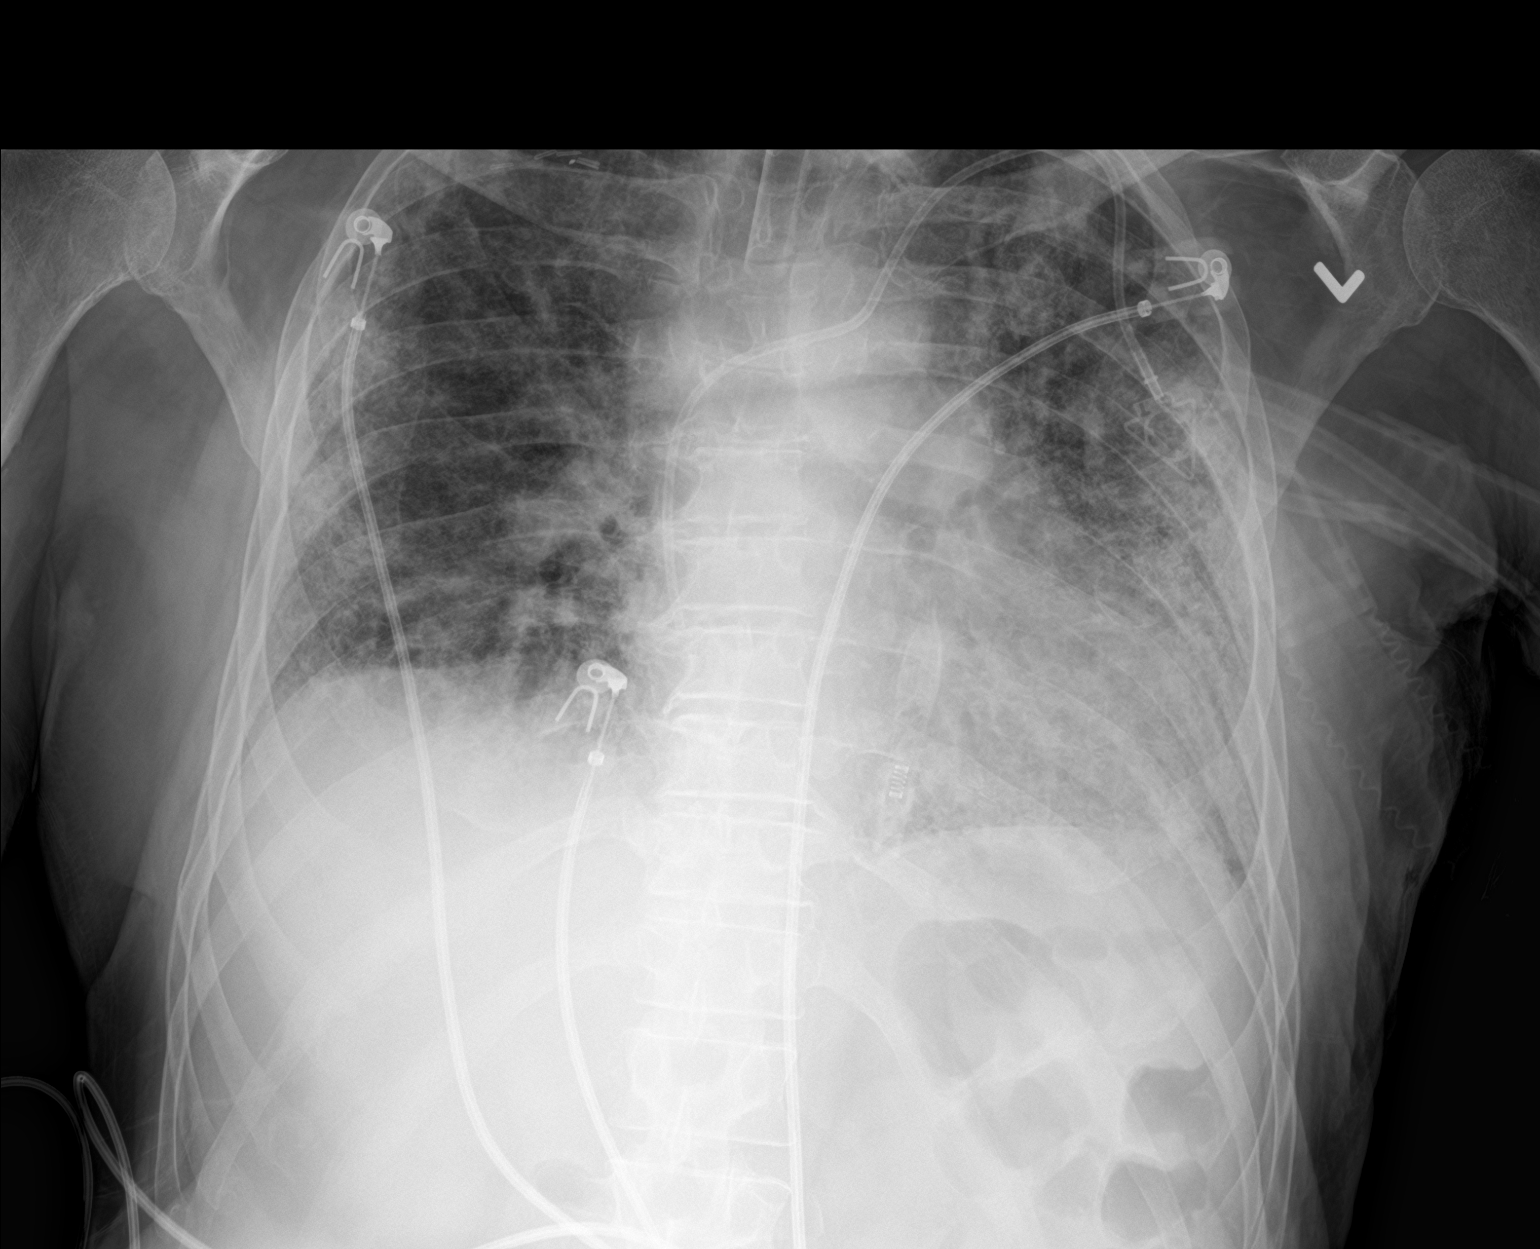

[1 of 1 positions shown; findings below may reference images not displayed]

FINDINGS: Tracheostomy tube and left-sided chest wall port are again seen and
stable. Cardiac shadow is stable. Persistent opacities are noted
bilaterally slightly worse on the left than the right stable from
the previous exam.
IMPRESSION: Stable appearance of the chest when compared with the previous day.
No recurrent pneumothorax is noted.

## 2022-03-03 IMAGING — RF DG SNIFF TEST
4 series · 15 of 15 positions shown · non-contrast
Comparison: None.

CLINICAL DATA: Ventilated patient. Patient having difficulty
weaning off ventilator.

EXAM:
CHEST FLUOROSCOPY
TECHNIQUE: Real-time fluoroscopic evaluation of the chest was performed.
FLUOROSCOPY TIME:  Fluoroscopy Time:  42 seconds
Radiation Exposure Index (if provided by the fluoroscopic device):
18.1 mGy
Number of Acquired Spot Images: 1

[Series 1: cp_chest · 0.51mm/px · 4 of 275 frames shown (1 of 3)]
[frame 2/275]
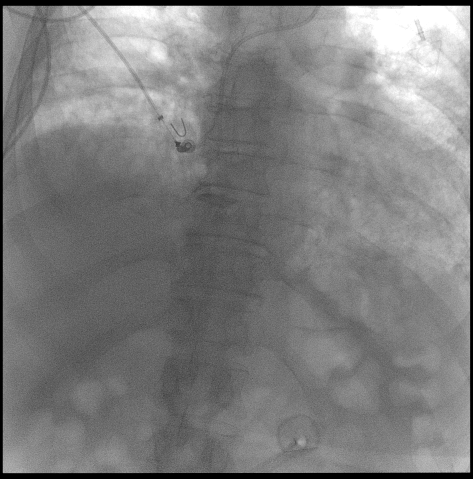
[frame 42/275]
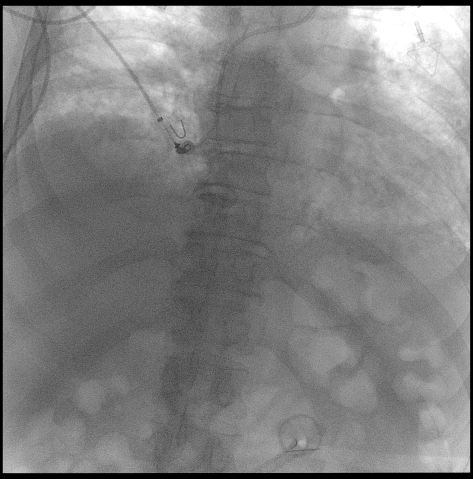
[frame 138/275]
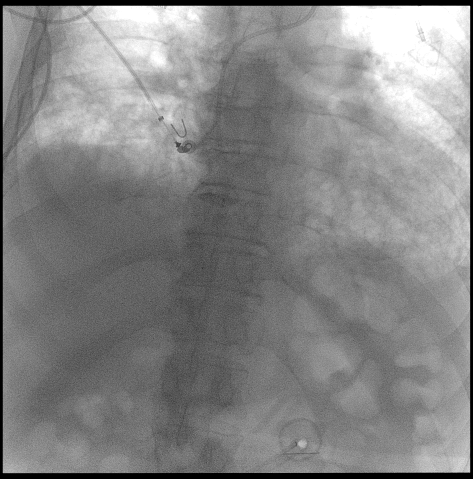
[frame 234/275]
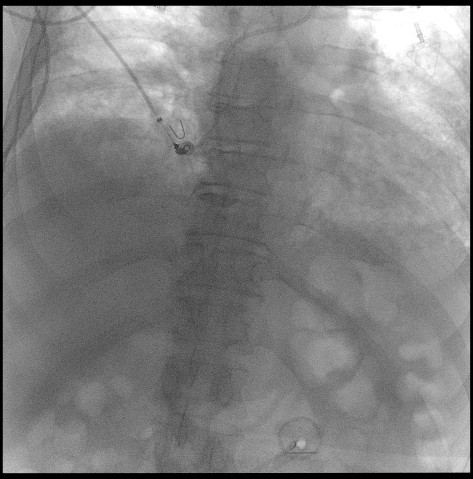

[Series 2: cp_chest · 0.51mm/px · 4 of 321 frames shown (2 of 3)]
[frame 1/321]
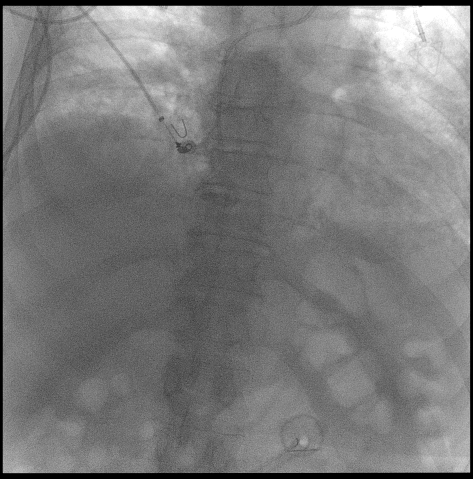
[frame 49/321]
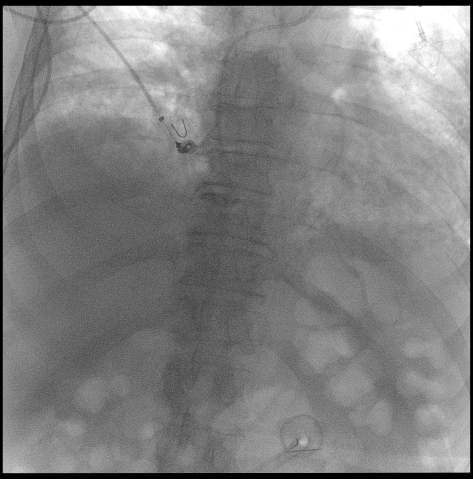
[frame 161/321]
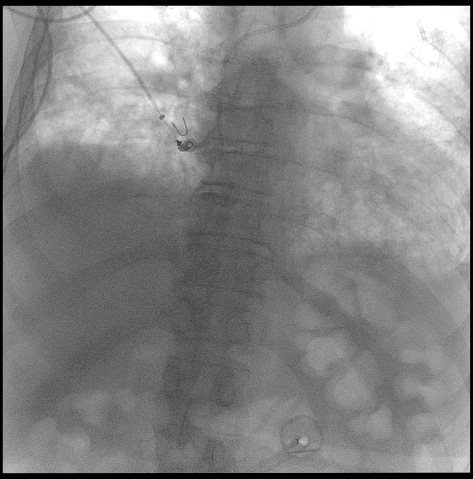
[frame 273/321]
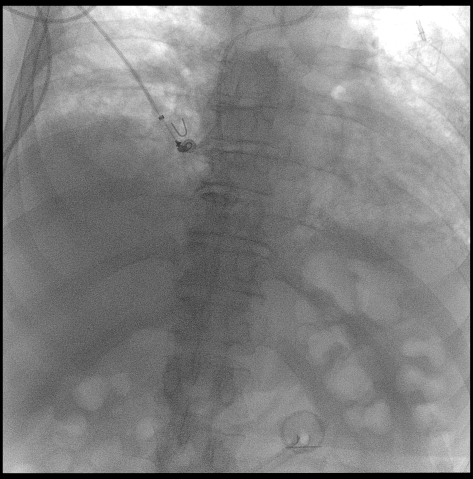

[Series 3: fluoro_chest_bb · 0.17mm/px · 3 of 4 frames shown]
[frame 1/4]
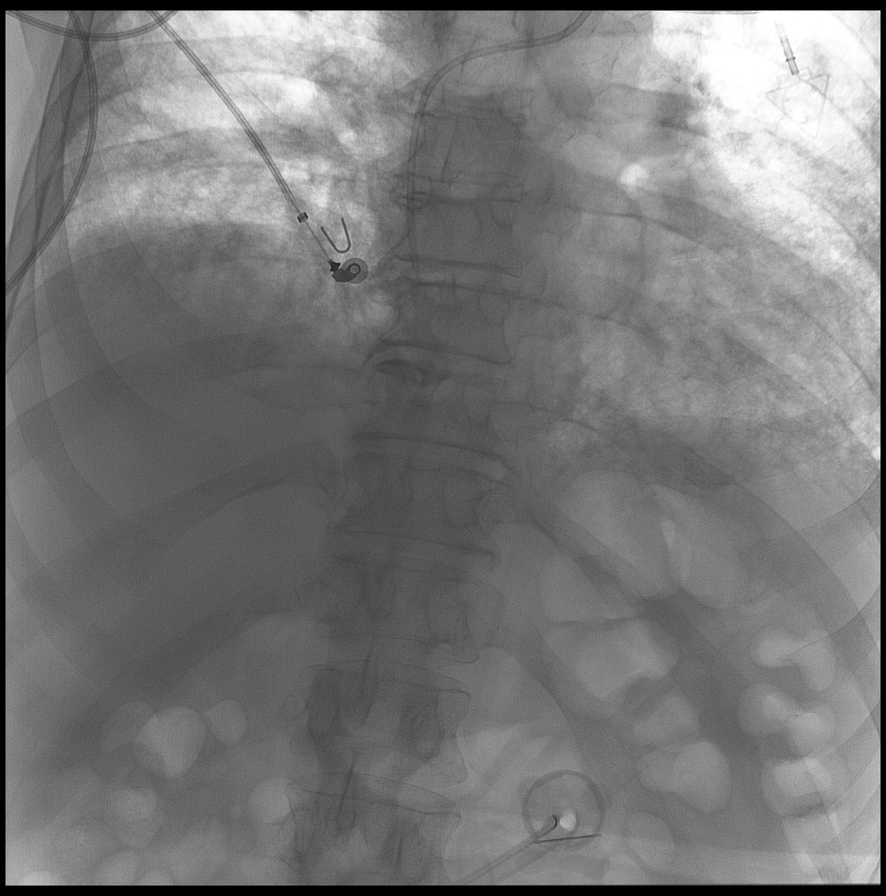
[frame 3/4]
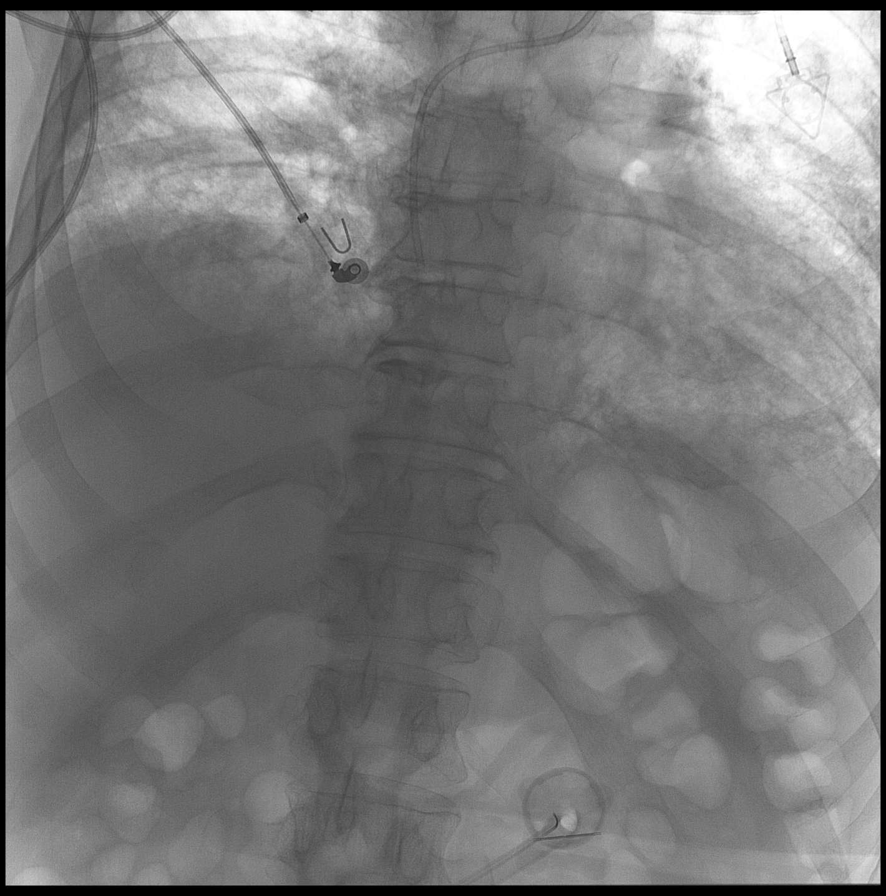
[frame 4/4]
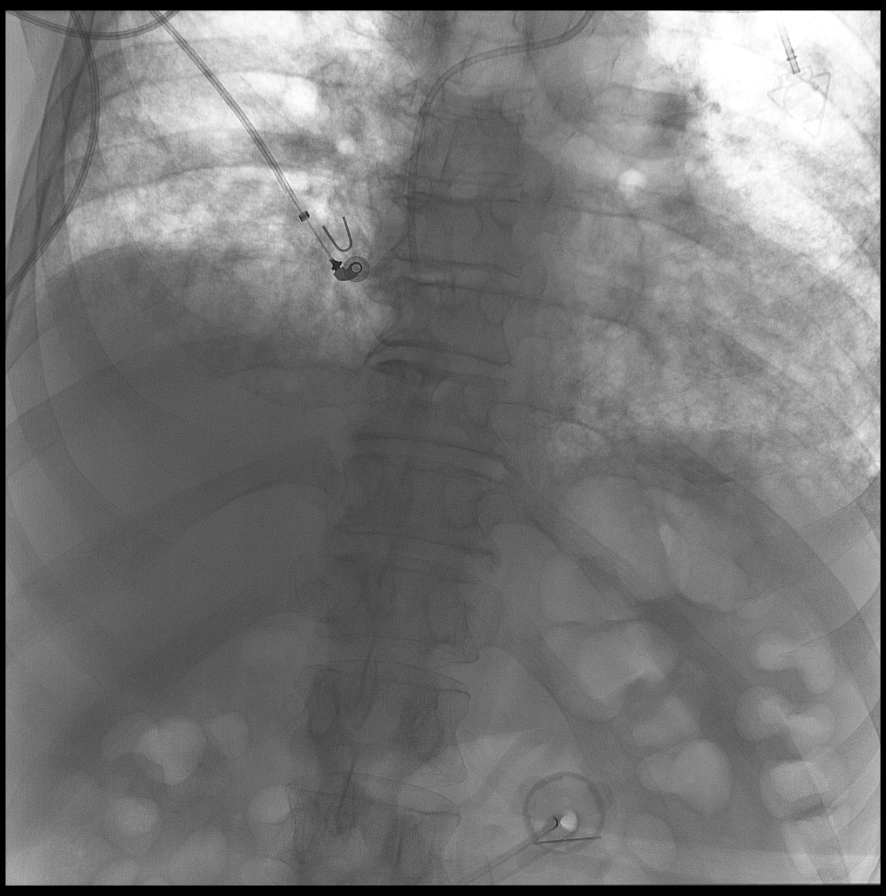

[Series 4: cp_chest · 0.51mm/px · 4 of 372 frames shown (3 of 3)]
[frame 1/372]
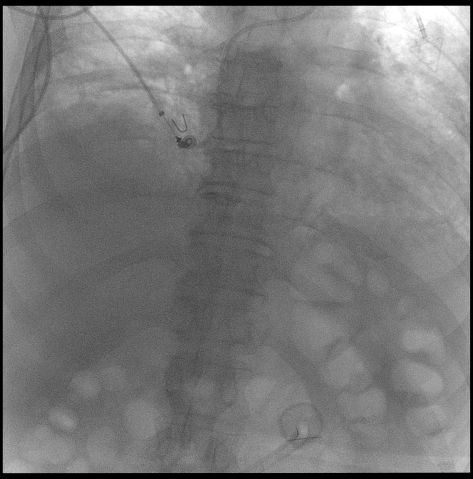
[frame 56/372]
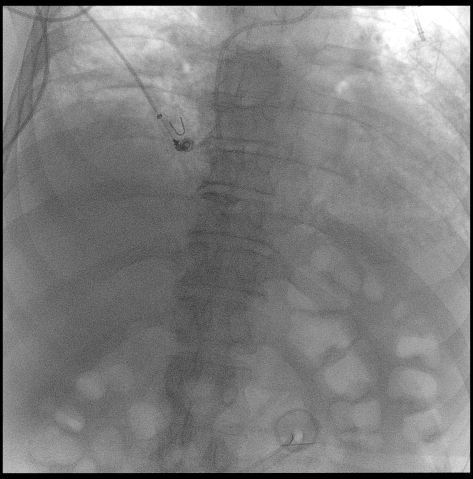
[frame 187/372]
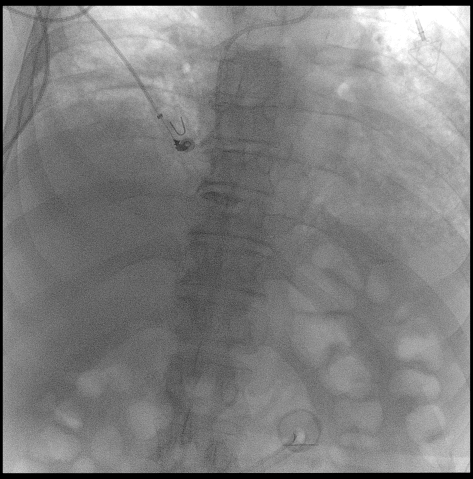
[frame 317/372]
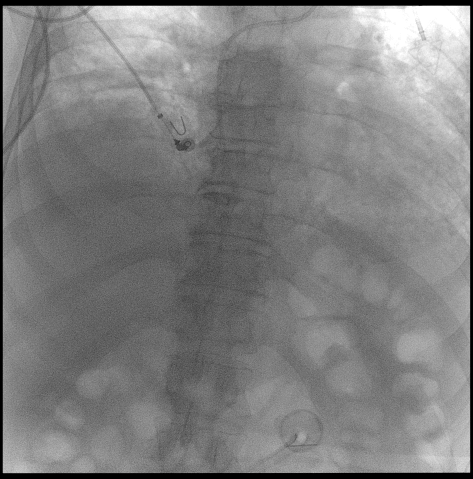

[15 of 15 positions shown; findings below may reference images not displayed]

FINDINGS: With patient on mechanical ventilation, real-time assessment of
diaphragmatic motion was performed. The diaphragm has equal inferior
movement with inspiration.

Mechanical ventilation was halted for several seconds. Patient has
very shallow movement of the hemidiaphragms. The LEFT hemidiaphragm
has greater movement than the RIGHT.
IMPRESSION: 1. Equal diaphragmatic movement on mechanical ventilation.
2. Off mechanical ventilation, very shallow movement of the
hemidiaphragms.
The LEFT hemidiaphragm appears to have slightly greater movement
than the RIGHT.

## 2022-03-11 IMAGING — DX DG ABDOMEN 1V
1 series · 1 of 1 positions shown · non-contrast
Comparison: 08/28/2019.

CLINICAL DATA: Nausea and vomiting.

EXAM:
ABDOMEN - 1 VIEW

[abdomen kub]
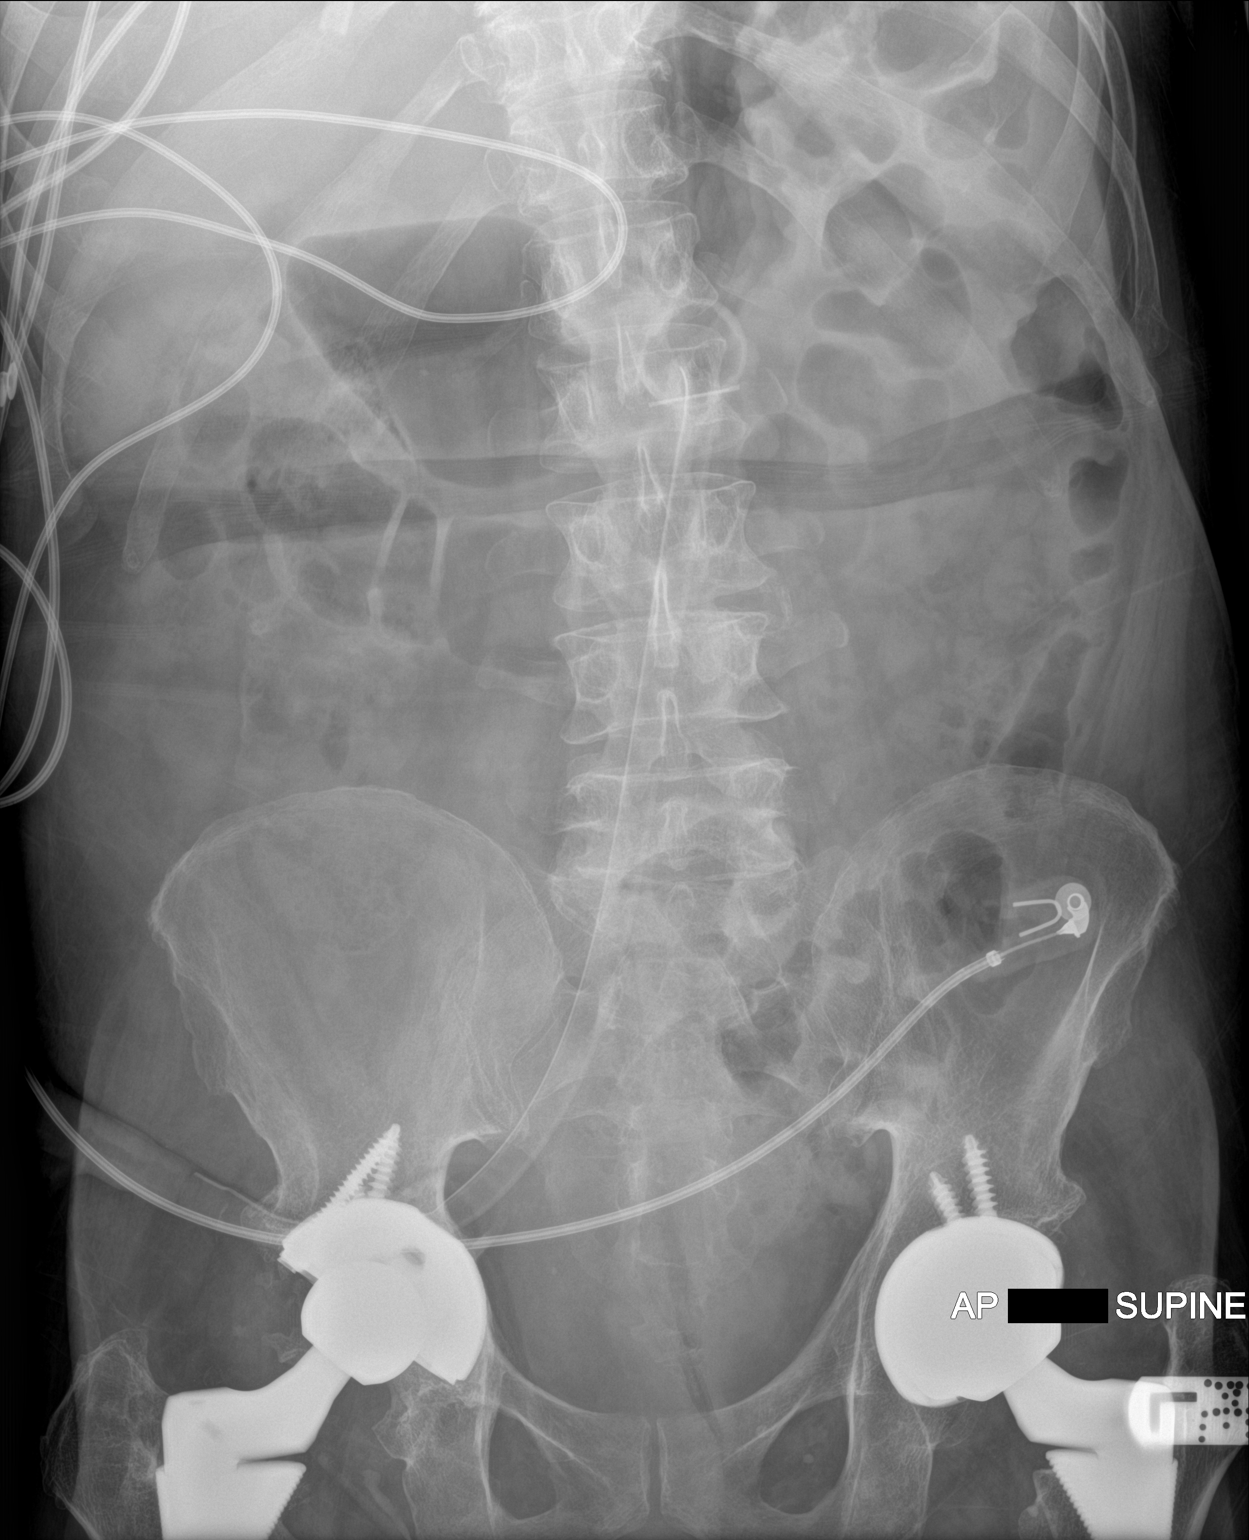

[1 of 1 positions shown; findings below may reference images not displayed]

FINDINGS: Gastrostomy tube noted with tip projected over the stomach. No
gastric or bowel distention or free air. Degenerative change lumbar
spine. Postsurgical changes both hips.
IMPRESSION: Gastrostomy tube noted with tip over the stomach. No gastric or
bowel distention.

## 2022-03-21 IMAGING — DX DG CHEST 1V PORT
1 series · 1 of 1 positions shown · non-contrast
Comparison: One-view chest x-ray 09/26/2019

CLINICAL DATA: Pneumonia.

EXAM:
PORTABLE CHEST 1 VIEW

[chest ap]
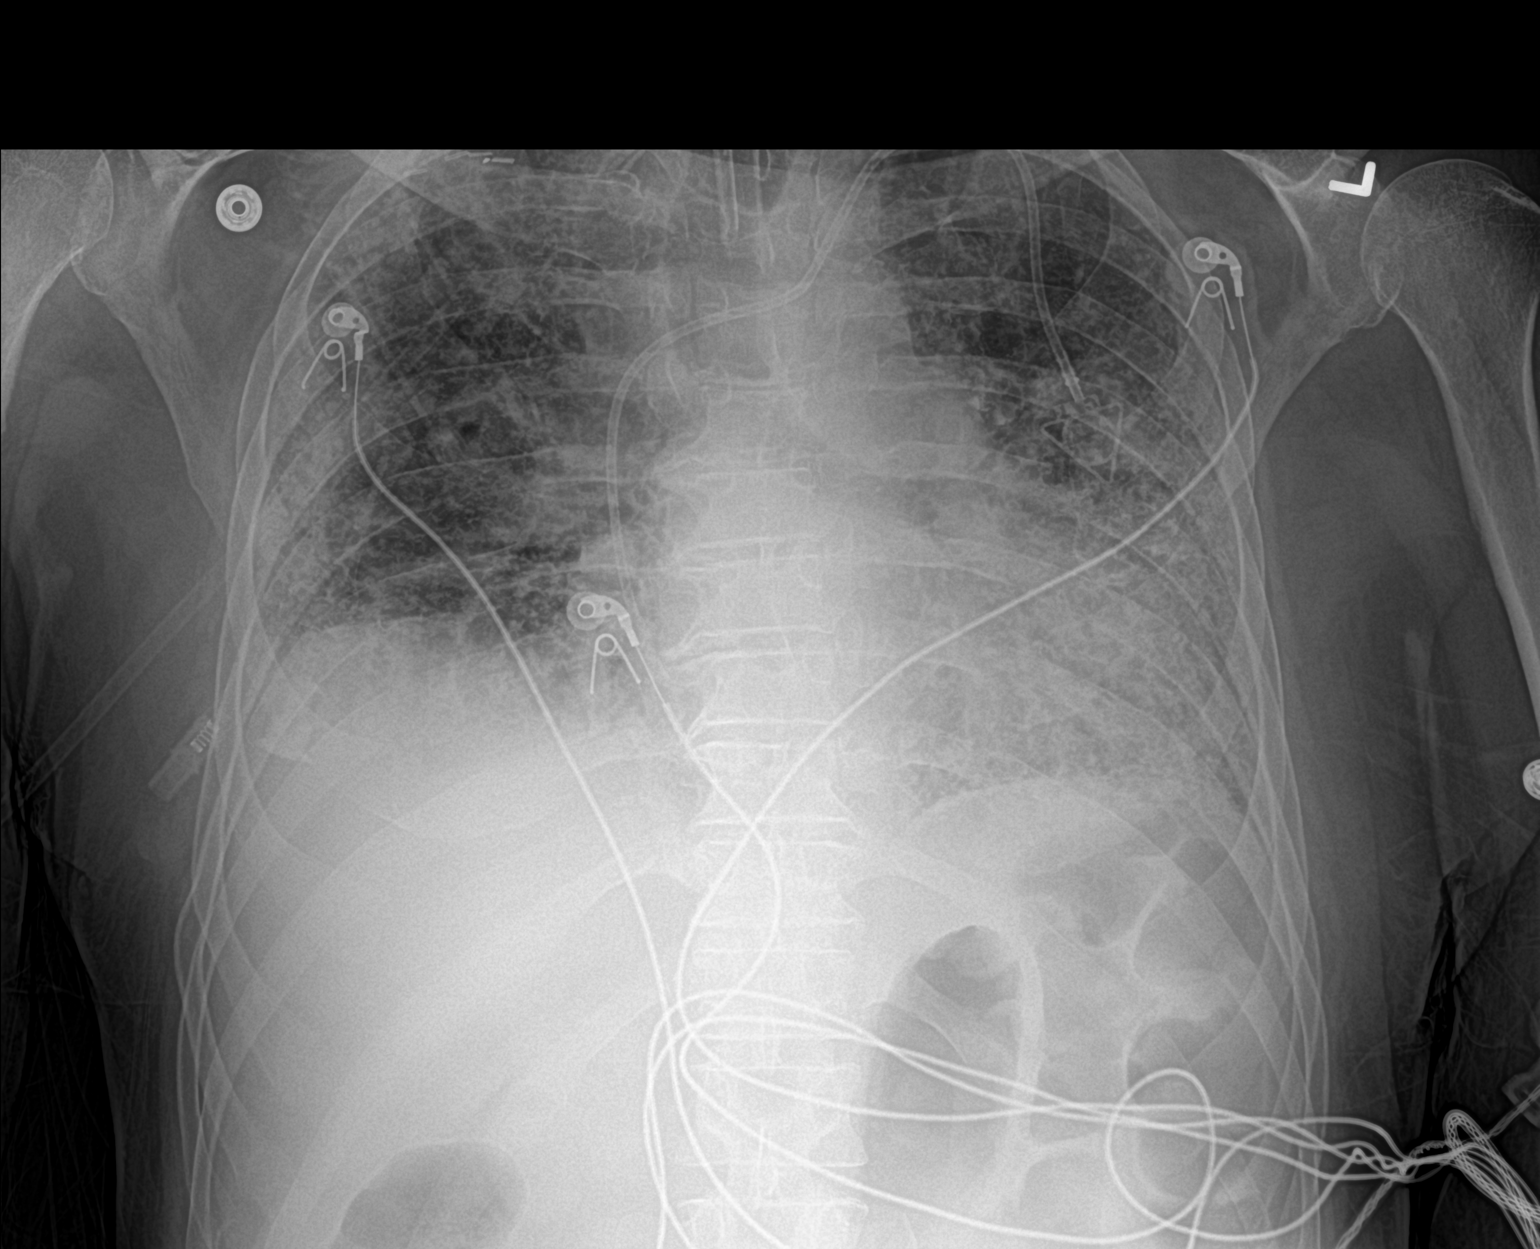

[1 of 1 positions shown; findings below may reference images not displayed]

FINDINGS: The heart is enlarged. Diffuse interstitial and airspace disease
demonstrates some progression. Disease is greatest at the left lung
base and lateral right lung. Tracheostomy tube is stable. Left IJ
Port-A-Cath is stable. Bowel gas pattern is normal.
IMPRESSION: 1. Slight progression of diffuse interstitial and airspace disease.
2. Stable cardiomegaly.

## 2022-09-08 IMAGING — DX DG CHEST 1V PORT
1 series · 1 of 1 positions shown · non-contrast
Comparison: October 04, 2019

CLINICAL DATA: Questionable sepsis evaluate for abnormality with
increasing cough and secretions since [REDACTED], history of pneumonia
and pneumothorax.

EXAM:
PORTABLE CHEST 1 VIEW

[chest ap]
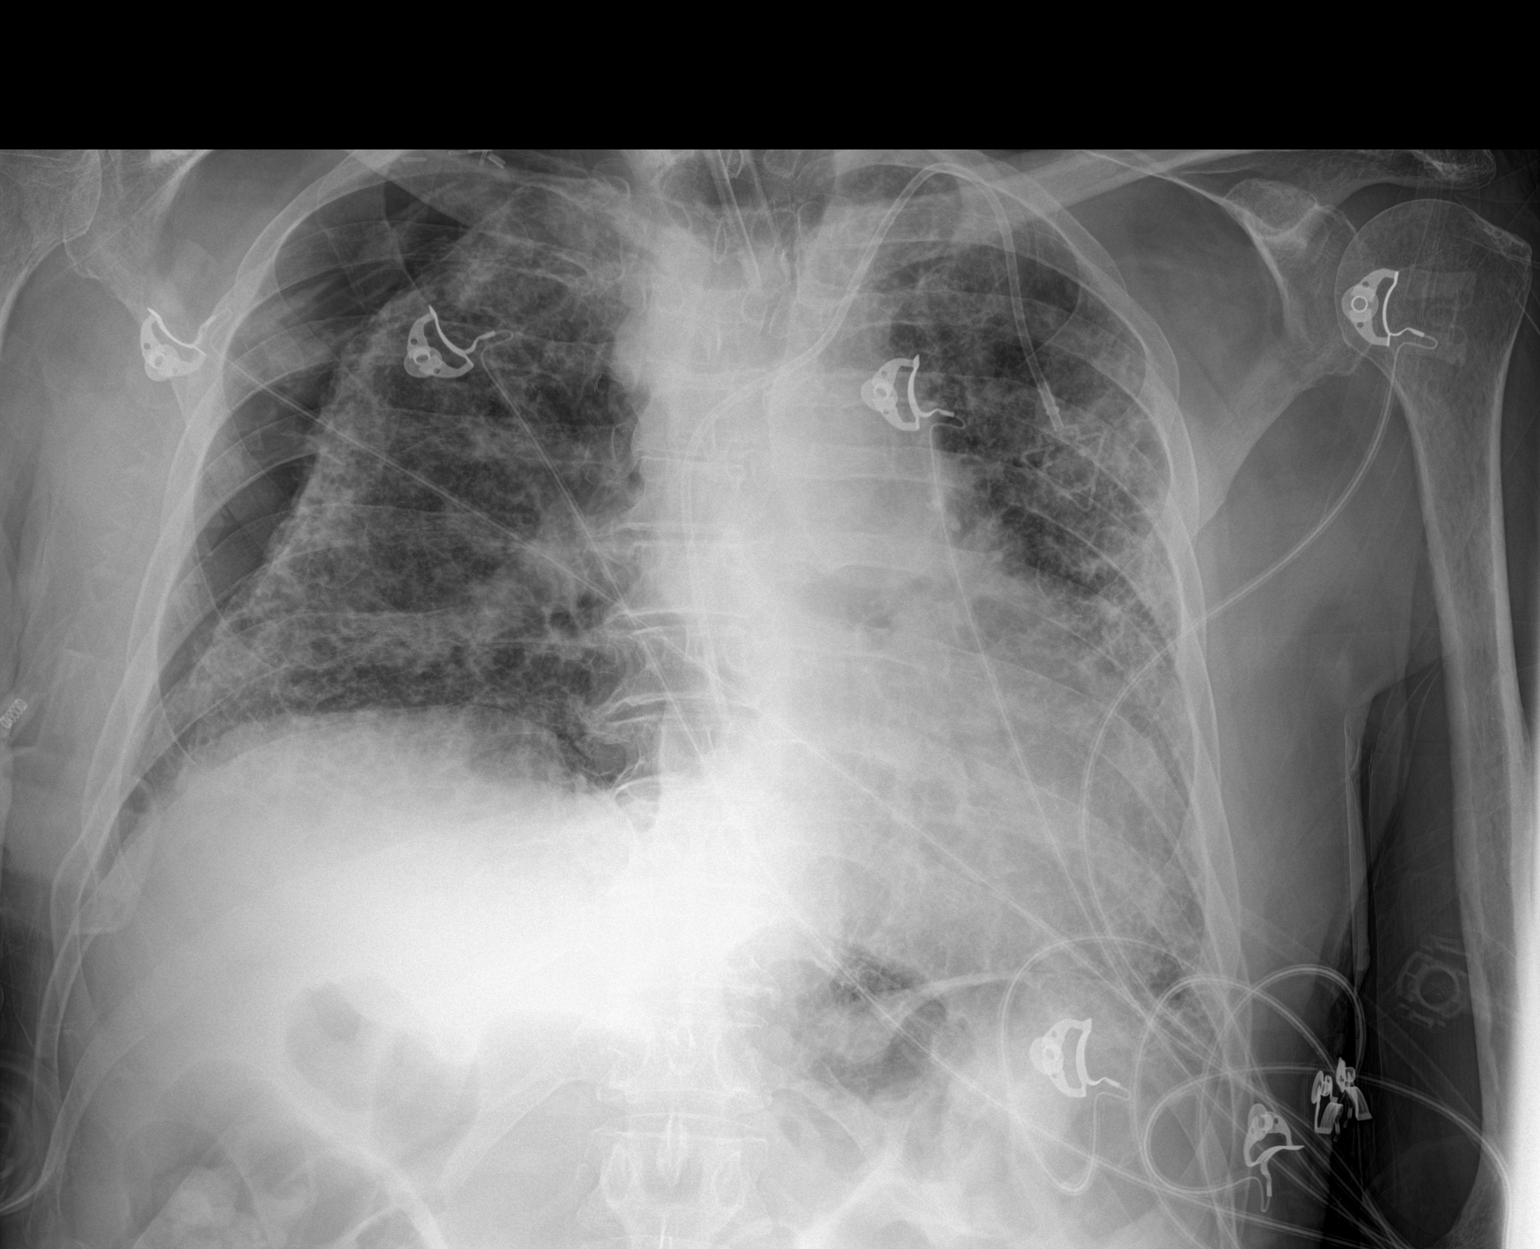

[1 of 1 positions shown; findings below may reference images not displayed]

FINDINGS: LEFT-sided Port-A-Cath in place as on the previous study, tip in the
area of the caval to atrial junction.

Cardiomediastinal contours show mild shift from RIGHT to LEFT.
Tracheostomy tube is in place, balloon and tip over the level of the
midclavicular trachea. Note that the rounded lucency suspected to
represent the tracheostomy balloon measures 5 cm in the tracheal air
column at this level measures approximately 2.5 cm.

Moderate RIGHT pneumothorax, approximately 3.3 cm along the upper
lateral chest.

Diffuse interstitial disease with patchy areas of airspace disease
suggested in the LEFT chest along the periphery.

On limited assessment no acute skeletal process.
IMPRESSION: 1. Moderate RIGHT pneumothorax with signs of tension and mild
mediastinal shift from RIGHT to LEFT.
2. Lucency with rounded margins at the thoracic inlet projecting
over the tracheal air column may represent the tracheostomy balloon,
this is much larger than the tracheal air column, correlate with any
signs of tracheal injury. CT may be helpful for further assessment
of the patient is able.
3. Background interstitial lung disease with areas of patchy opacity
suggested on the LEFT, difficult to exclude the possibility of
concomitant infection.

Critical Value/emergent results were called by telephone at the time
of interpretation on 03/23/2020 at [DATE] to provider IYARE
SQUIRE , who verbally acknowledged these results.

## 2022-09-08 IMAGING — DX DG CHEST 1V PORT
1 series · 1 of 1 positions shown · non-contrast
Comparison: Same day.

CLINICAL DATA: Chest tube placement.

EXAM:
PORTABLE CHEST 1 VIEW

[chest ap]
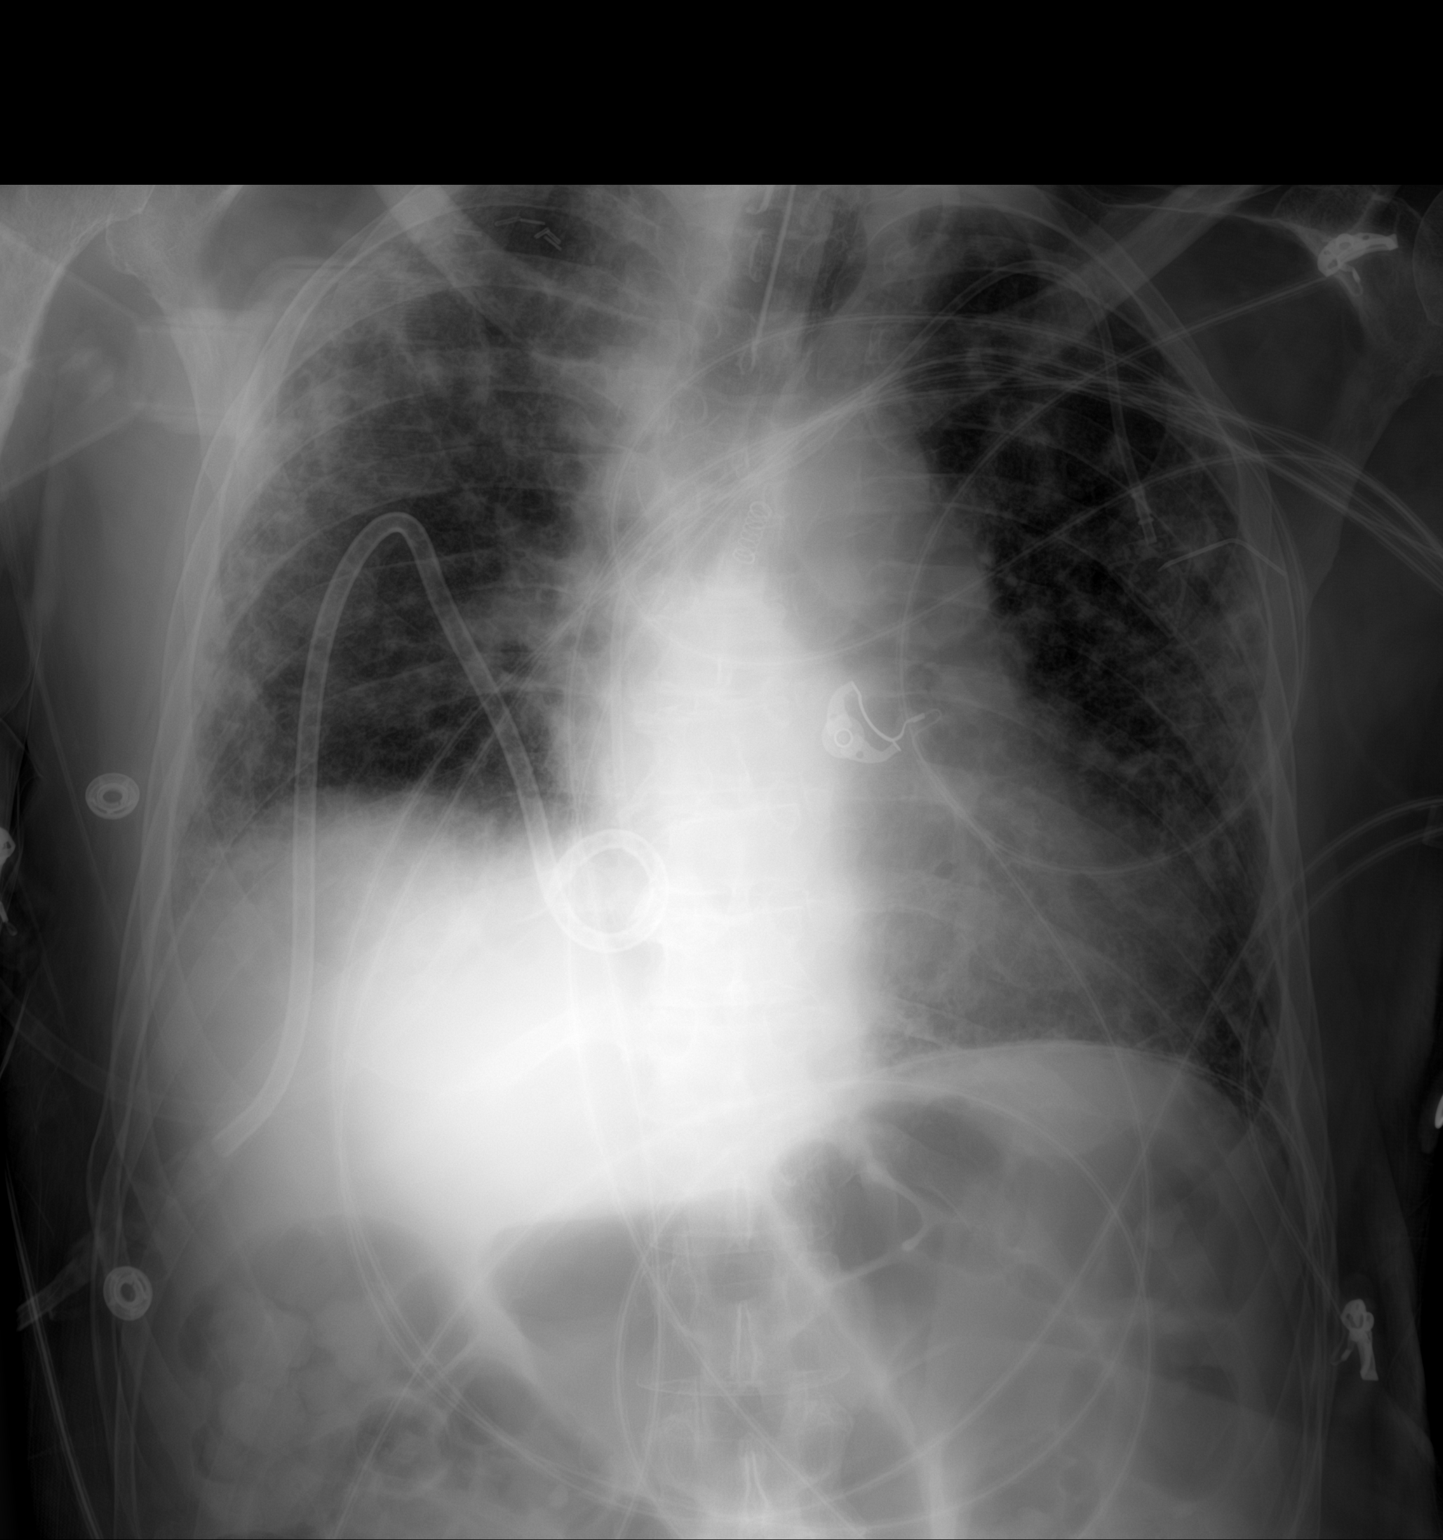

[1 of 1 positions shown; findings below may reference images not displayed]

FINDINGS: Stable cardiomediastinal silhouette. Tracheostomy tube is unchanged
in position. Interval placement of right-sided pigtail chest tube.
Pneumothorax has resolved. Stable bilateral patchy airspace
opacities are noted concerning for multifocal inflammation or
chronic lung disease. Left subclavian Port-A-Cath is unchanged in
position. Bony thorax is unremarkable.
IMPRESSION: Interval placement of right-sided pigtail chest tube. Pneumothorax
has resolved. Stable bilateral patchy airspace opacities are noted
concerning for multifocal inflammation or chronic lung disease.

## 2022-09-09 IMAGING — CT CT NECK W/O CM
5 series · 16 of 35 positions shown, 18 images · non-contrast
Comparison: February 28, 2019

CLINICAL DATA: Possible pneumomediastinum

EXAM:
CT NECK WITHOUT CONTRAST
TECHNIQUE: Multidetector CT imaging of the neck was performed following the
standard protocol without intravenous contrast.

[Series 5: axial neck · axial · 0.39mm/px · z∈[+457,+553]mm · 3 of 98 slices shown]
[im 25/98  bone]
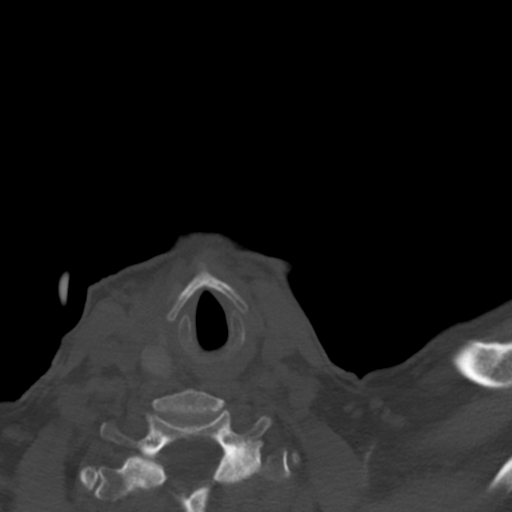
[im 49/98  bone]
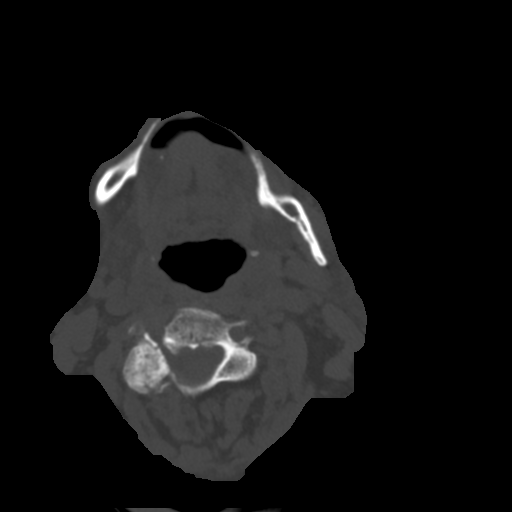
[im 73/98  bone]
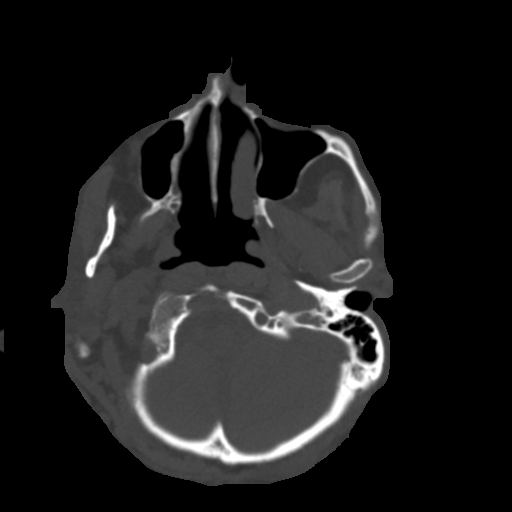

[Series 7: axial bone · axial · 0.39mm/px · z∈[+473,+537]mm · 2 of 98 slices shown]
[im 33/98  bone]
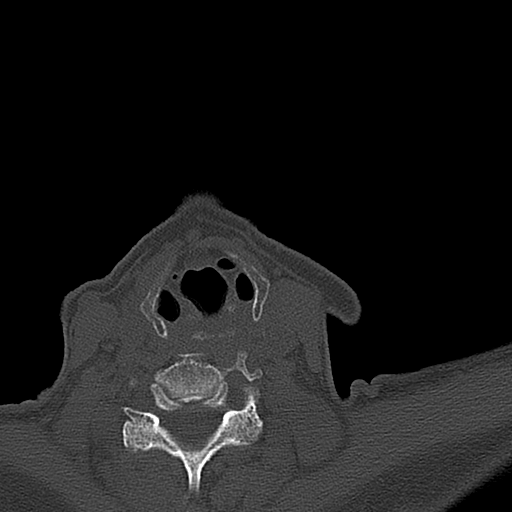
[im 65/98  bone]
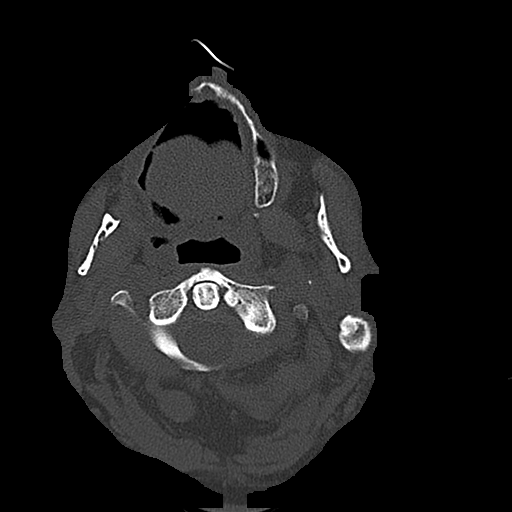

[Series 602: axial reformats · axial · 0.39mm/px · z∈[+410,+521]mm · 3 of 124 slices shown, 4 images]
[im 31/124  soft-tissue]
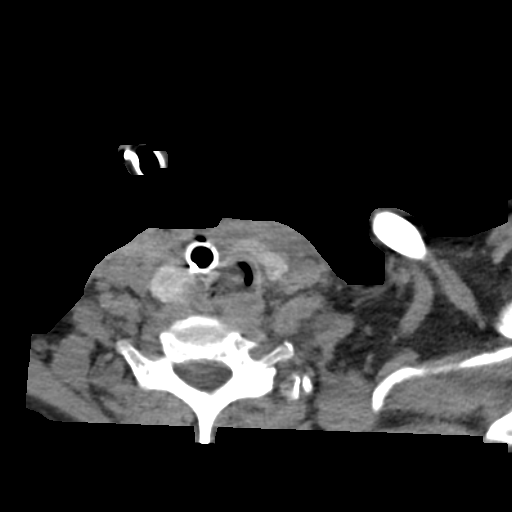
[im 31/124  bone]
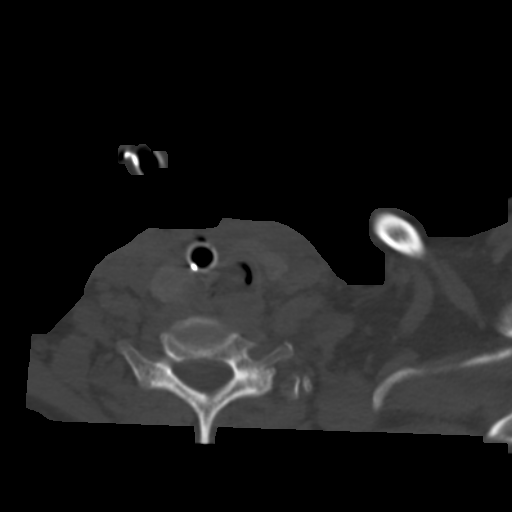
[im 62/124  bone]
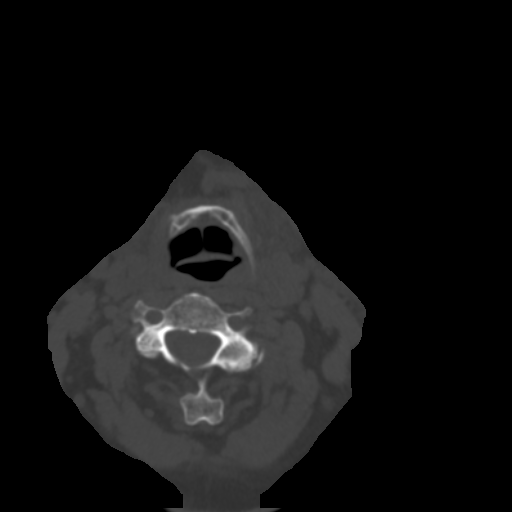
[im 93/124  bone]
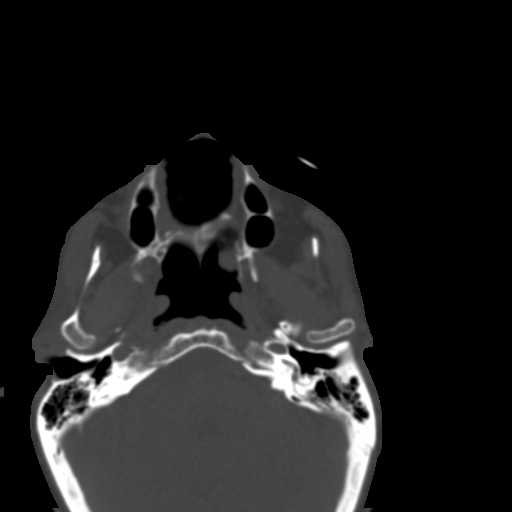

[Series 604: coronal images · coronal · 0.39mm/px · 3 of 100 slices shown]
[im 35/100  bone]
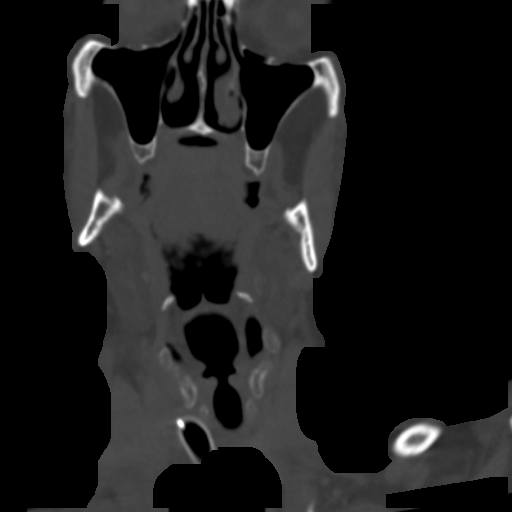
[im 45/100  bone]
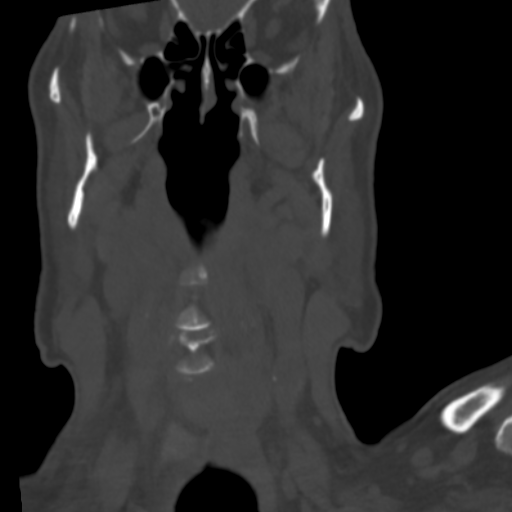
[im 55/100  bone]
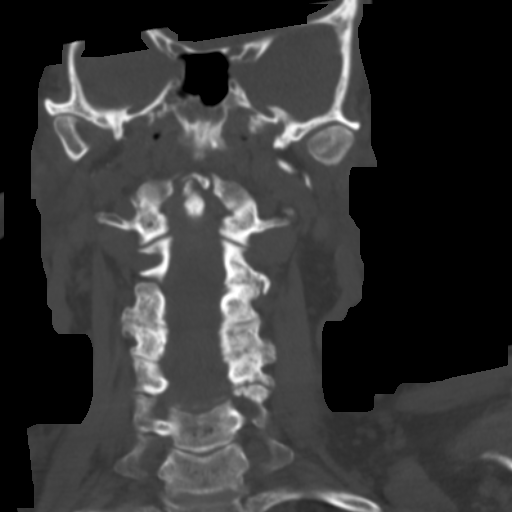

[Series 605: sagittal · sagittal · 0.39mm/px · 5 of 87 slices shown, 6 images]
[im 29/87  bone]
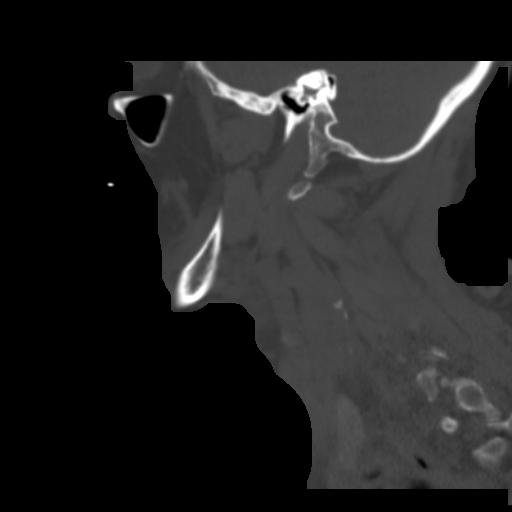
[im 36/87  bone]
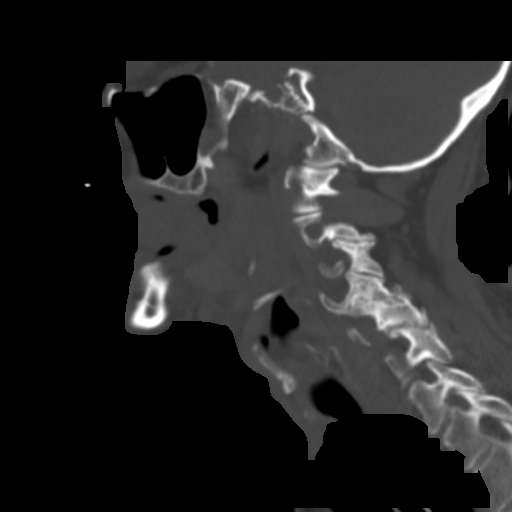
[im 44/87  soft-tissue]
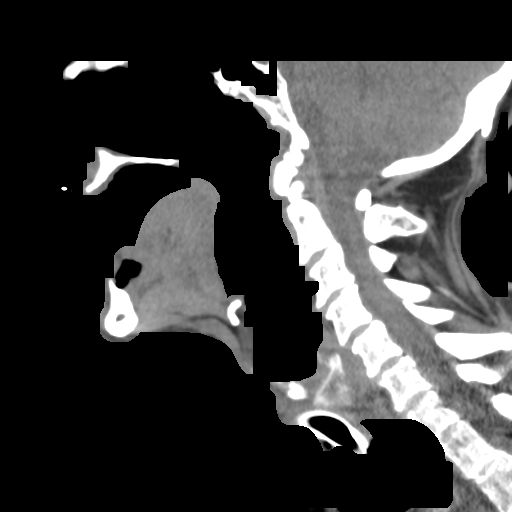
[im 44/87  bone]
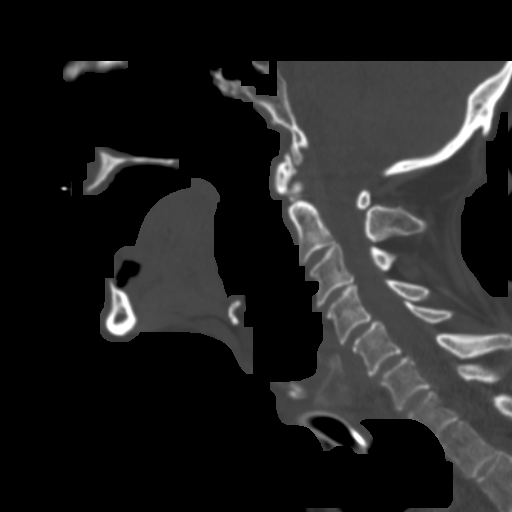
[im 51/87  bone]
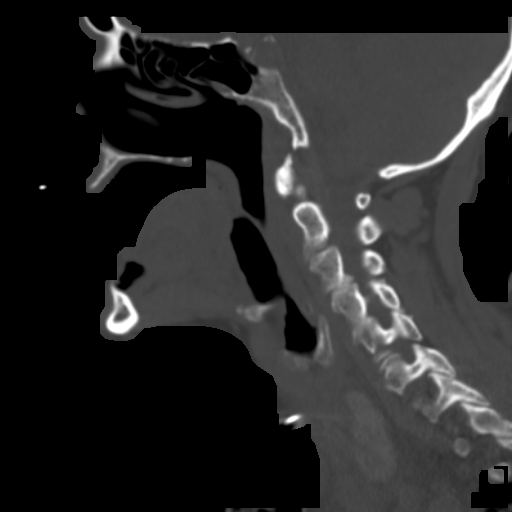
[im 58/87  bone]
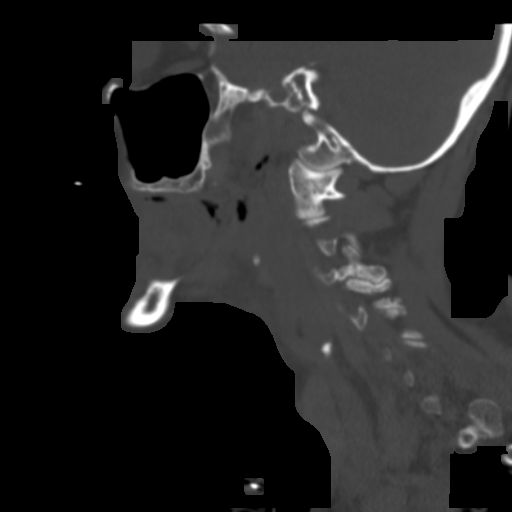

[16 of 35 positions shown; findings below may reference images not displayed]

FINDINGS: Pharynx and larynx: Tracheostomy device is present. The tube enters
the neck eccentric to the right with displacement of proximal
subglottic airway to the left. There is distension of the visualized
trachea and an over dilated balloon appears to be present within the
airway. Pharynx is unremarkable.

Salivary glands: Unremarkable.

Thyroid: Unremarkable.

Lymph nodes: No enlarged lymph nodes identified.

Vascular: Mild calcified plaque at the ICA origins.

Limited intracranial: No acute abnormality.

Visualized orbits: Unremarkable.

Mastoids and visualized paranasal sinuses: Aerated.

Skeleton: Multilevel degenerative changes of the cervical spine.

Upper chest: Refer to dedicated chest imaging.

Other: None.
IMPRESSION: Malpositioned tracheostomy device with balloon distending the
trachea.

## 2022-09-09 IMAGING — DX DG CHEST 1V PORT
1 series · 1 of 1 positions shown · non-contrast
Comparison: 03/23/2020.  09/15/2019.  CT 08/20/2019.

CLINICAL DATA: History of right pneumothorax.

EXAM:
PORTABLE CHEST 1 VIEW

[chest ap]
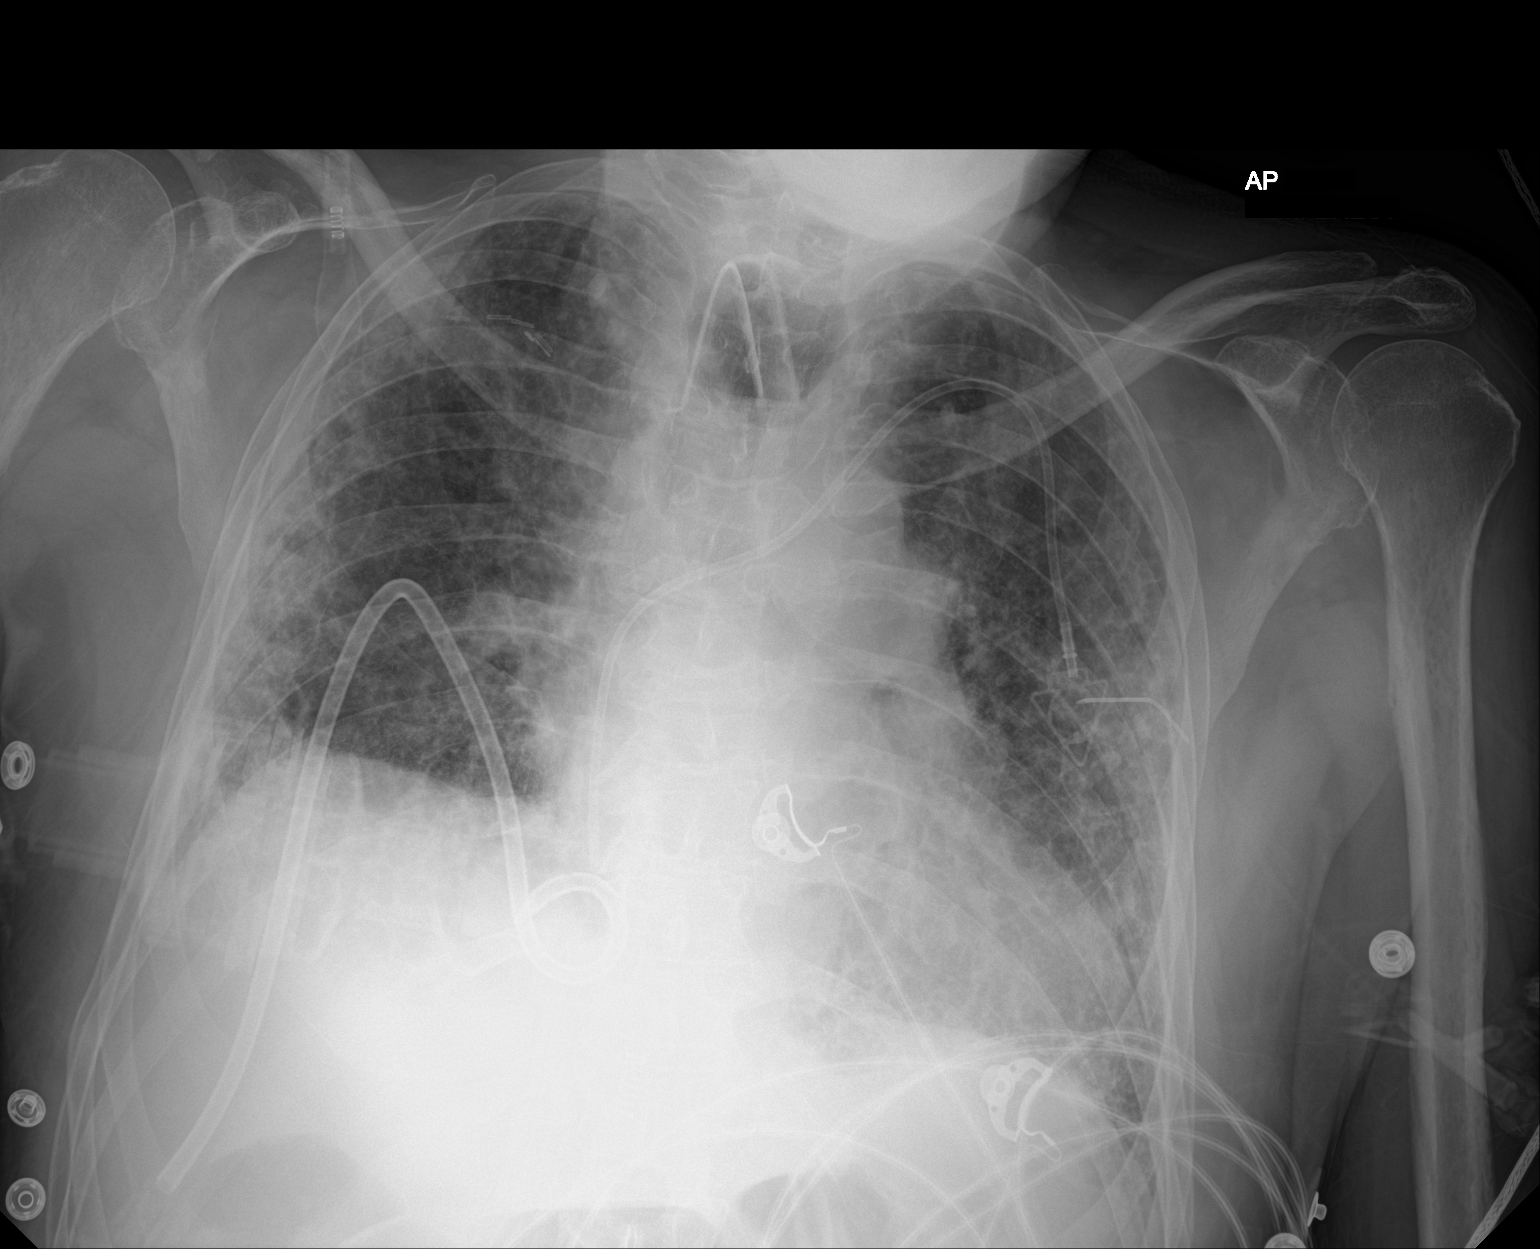

[1 of 1 positions shown; findings below may reference images not displayed]

FINDINGS: Tracheostomy tube, PowerPort catheter, right chest tube in stable
position. Persistent rounded air collection is again noted over the
superior mediastinum as noted on prior study. This could represent
over distention of tracheostomy balloon. An air collection in the
superior mediastinum cannot be excluded. Again clinical correlation
suggested. Very tiny right apical pneumothorax is noted on today's
exam. This is much smaller than noted on prior study of 03/23/2020.
Low lung volumes. Persistent chronic bilateral interstitial changes.
Heart size stable. No acute bony abnormality.
IMPRESSION: 1. Lines and tubes including right chest tube in stable position.
Very tiny right apical pneumothorax is noted on today's exam. This
is much smaller than on prior study of 03/23/2020.
2. Persistent rounded air again noted over the superior mediastinum.
Again this could represent over distension of the tracheostomy
balloon. An air collection in superior mediastinum cannot be
excluded. Again clinical correlation suggested.
3. Chronic interstitial lung disease again noted.

## 2022-09-09 IMAGING — CT CT CHEST W/O CM
2 of 4 series · 15 of 36 positions shown, 18 images · non-contrast
Comparison: Chest CT from August 2019.

CLINICAL DATA: History of COVID pneumonia with cough and chest
pain.

EXAM:
CT CHEST WITHOUT CONTRAST
TECHNIQUE: Multidetector CT imaging of the chest was performed following the
standard protocol without IV contrast.

[Series 2: thorax · axial · 0.74mm/px · z∈[+181,+467]mm · 12 of 167 slices shown, 15 images]
[im 12/167  mediastinal]
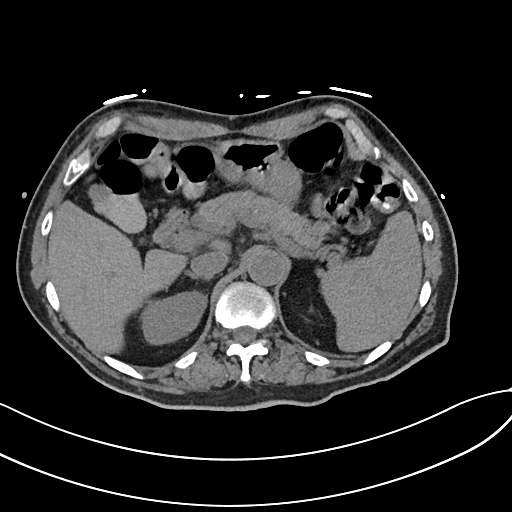
[im 12/167  lung]
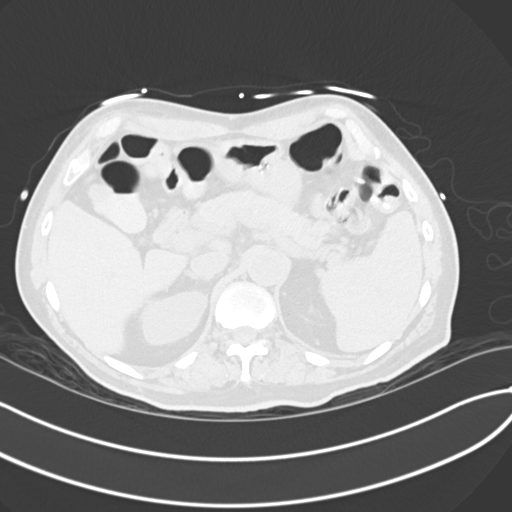
[im 24/167  lung]
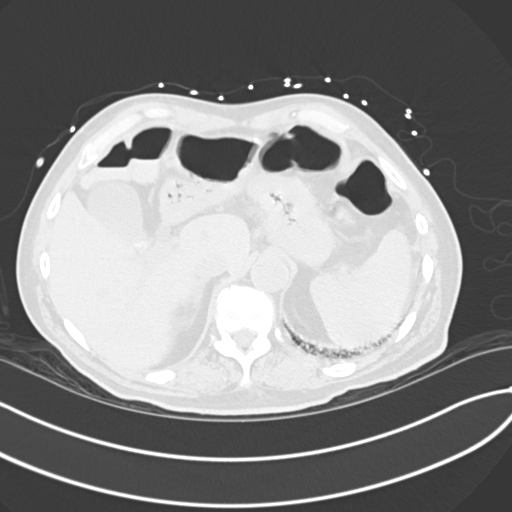
[im 36/167  lung]
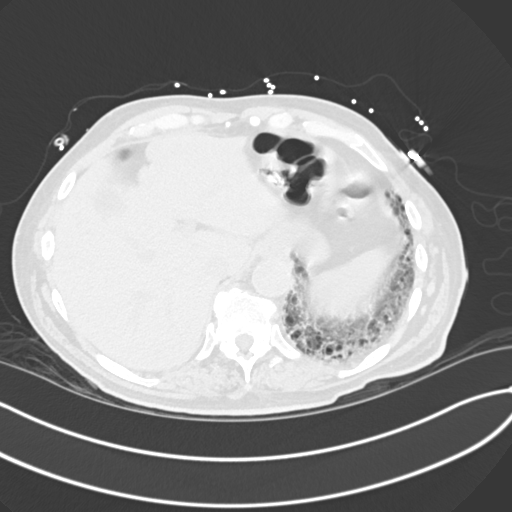
[im 48/167  lung]
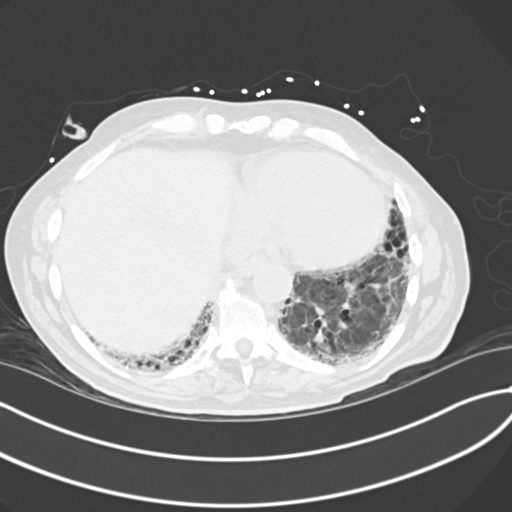
[im 60/167  mediastinal]
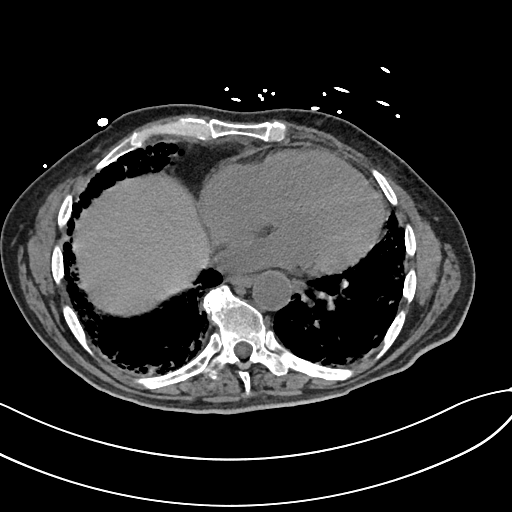
[im 60/167  lung]
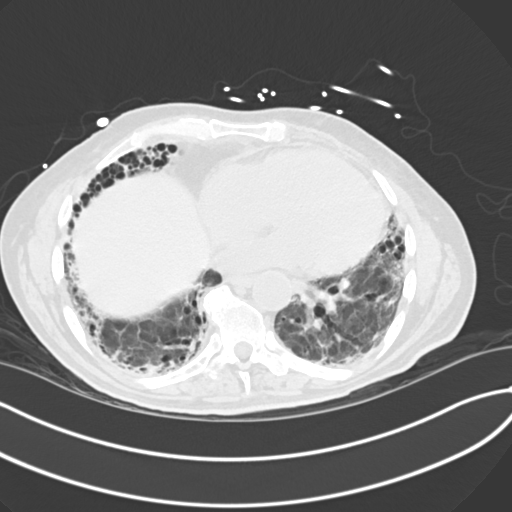
[im 72/167  lung]
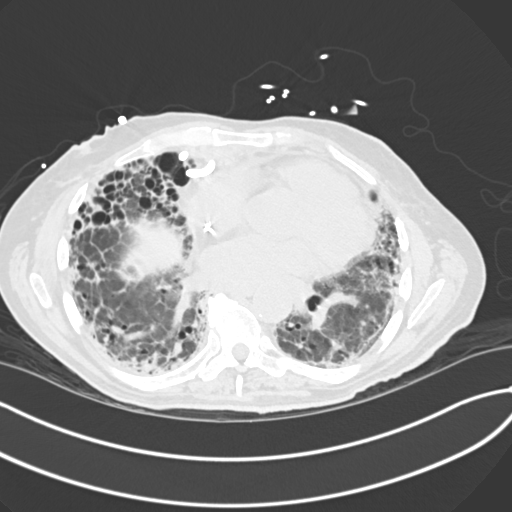
[im 95/167  lung]
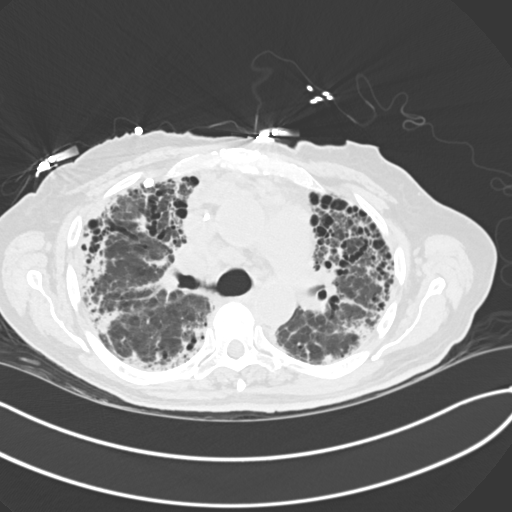
[im 107/167  lung]
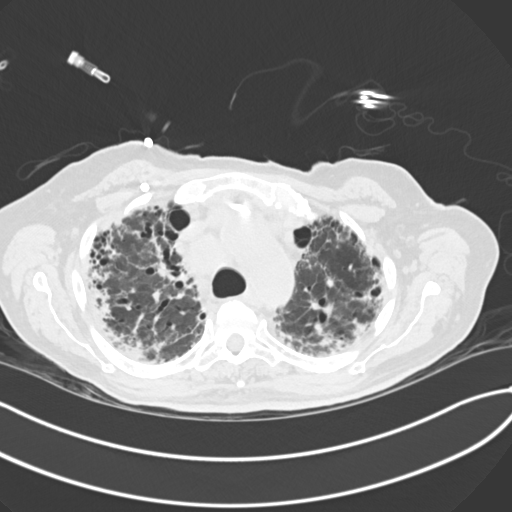
[im 119/167  mediastinal]
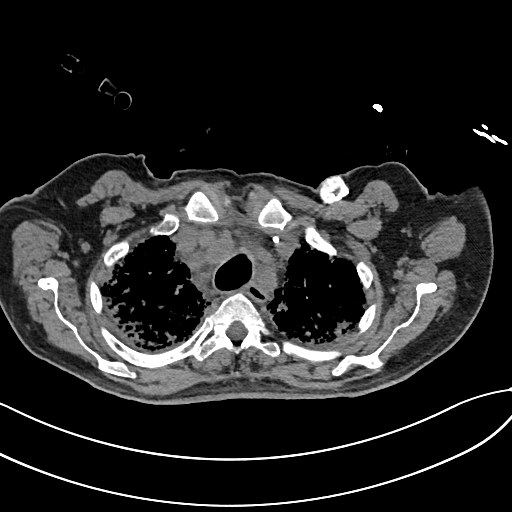
[im 119/167  lung]
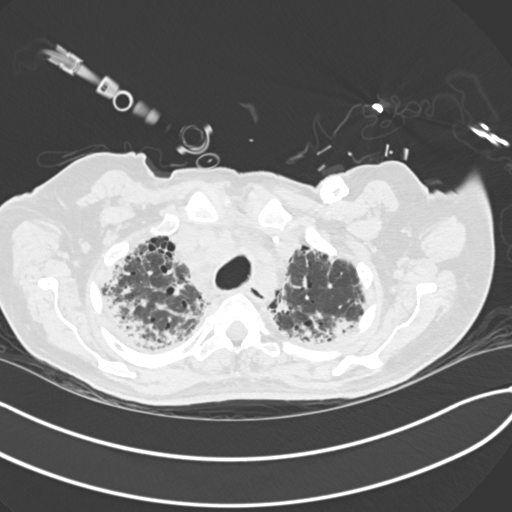
[im 131/167  lung]
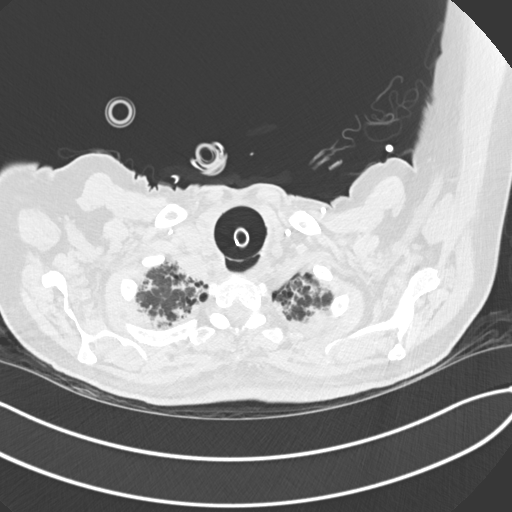
[im 143/167  lung]
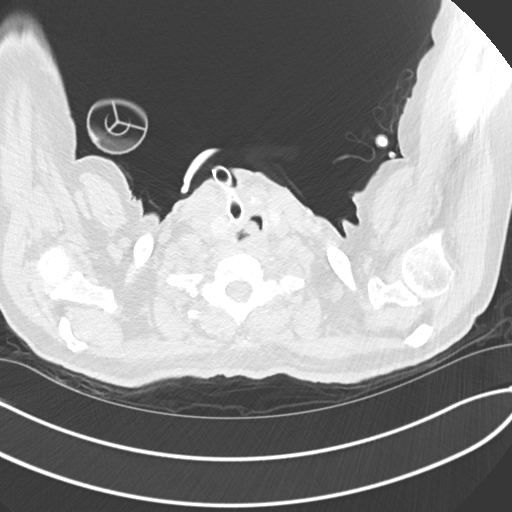
[im 155/167  lung]
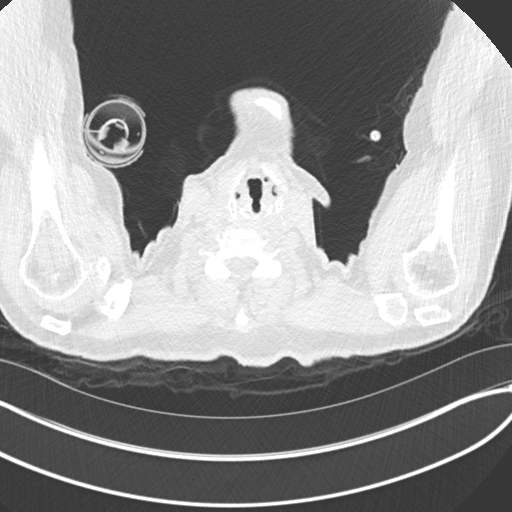

[Series 8: coronal · coronal · 0.66mm/px · 3 of 124 slices shown]
[im 25/124  lung]
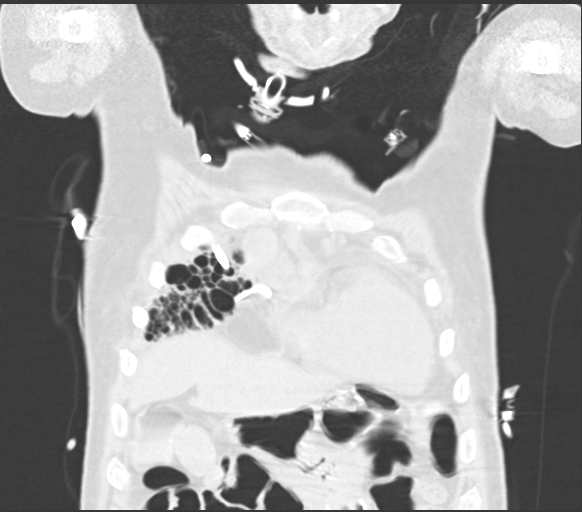
[im 50/124  lung]
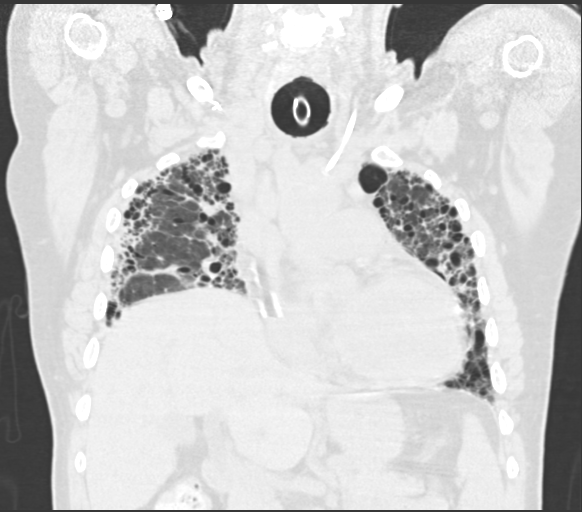
[im 74/124  lung]
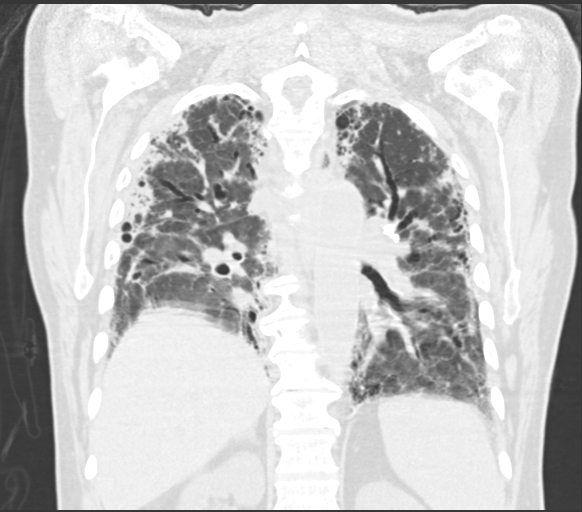

[15 of 36 positions shown; findings below may reference images not displayed]

FINDINGS: Cardiovascular: The heart is mildly enlarged but stable. No
pericardial effusion. Stable mild tortuosity and ectasia of the
thoracic aorta.

The pulmonary arteries are enlarged suggesting pulmonary
hypertension.

Mediastinum/Nodes: Enlarged mediastinal and hilar lymph nodes likely
due to the patient's underlying lung disease. The esophagus is
grossly normal. Stable tracheostomy tube.

Lungs/Pleura: Severe changes pulmonary fibrosis and extensive
bronchiectasis. No acute overlying pulmonary process is identified.
No worrisome pulmonary lesions. No pneumothorax or
pneumomediastinum. There is a stable drainage catheter in the right
lower thorax.

Upper Abdomen: No significant upper abdominal findings. Stable
gallstones and right renal calculi.

Musculoskeletal: No significant bony findings.
IMPRESSION: 1. Severe changes of pulmonary fibrosis and extensive
bronchiectasis.
2. No acute overlying pulmonary process is identified.
3. Stable drainage catheter in the right lower thorax. No
pneumothorax or pneumomediastinum.
4. Enlarged mediastinal and hilar lymph nodes likely due to the
patient's underlying lung disease.
5. Enlarged pulmonary arteries suggesting pulmonary hypertension.
6. Stable gallstones and right renal calculi.

## 2022-09-10 IMAGING — DX DG CHEST 1V PORT
1 series · 1 of 1 positions shown · non-contrast
Comparison: Chest x-ray 03/25/2020, CT chest 03/24/2020.

CLINICAL DATA: Tracheostomy tube exchange.

EXAM:
PORTABLE CHEST 1 VIEW

[chest ap]
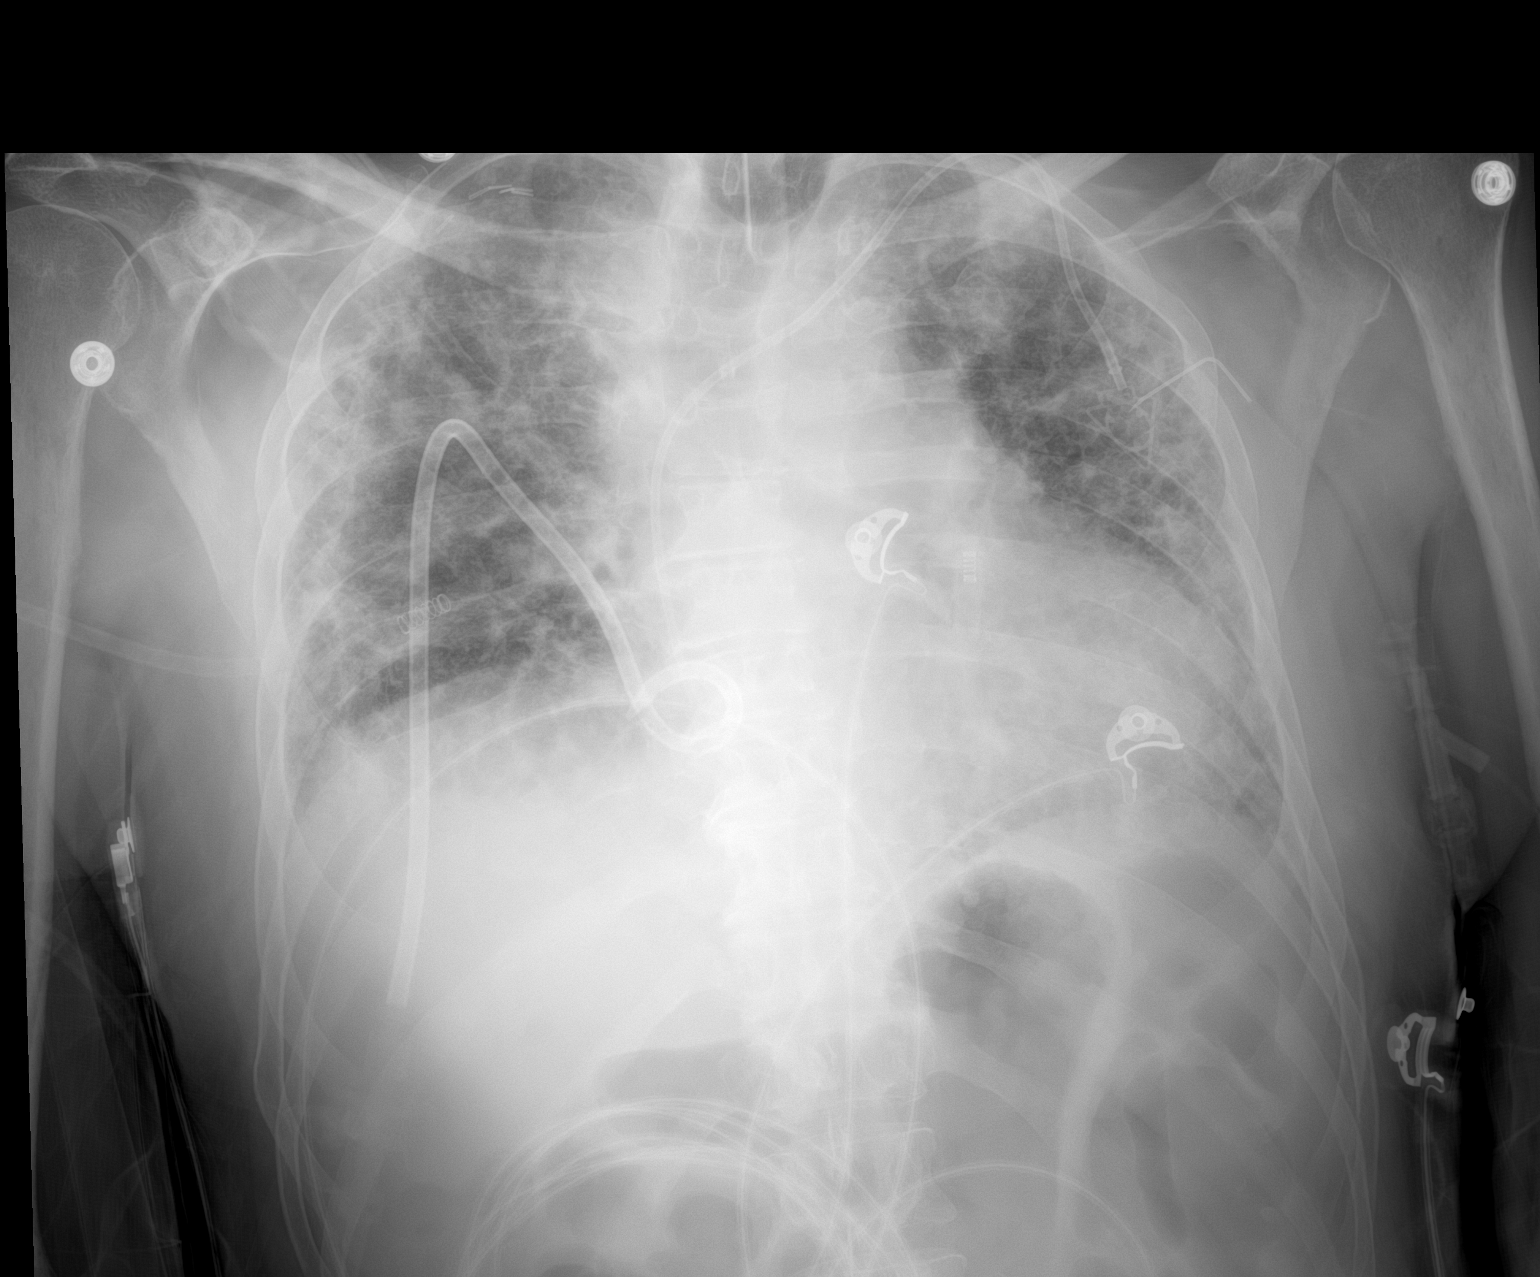

[1 of 1 positions shown; findings below may reference images not displayed]

FINDINGS: Tracheostomy tube with tip terminating approximately 5.5 cm above
the carina. Accessed left chest wall Port-A-Cath with tip overlying
the expected region of the right ventricle. Right pigtail catheter
with tip overlying the inferior medial hemithorax. Surgical clips
again noted overlying the right apex.

The heart size and mediastinal contours are unchanged

Persistent coarsened interstitial markings consistent with known
fibrosis and bronchiectasis. No new focal consolidation. No pleural
effusion. No pneumothorax.

No acute osseous abnormality.
IMPRESSION: 1. Tracheostomy tube with tip terminating 5.5 cm above the carina.
Other lines and tubes are stable.
2. Persistent coarsened interstitial markings consistent with known
fibrosis and bronchiectasis with no new focal consolidation or
pleural effusion compared to 03/25/2020 [DATE] a.m.

## 2022-09-10 IMAGING — DX DG CHEST 1V PORT
1 series · 1 of 1 positions shown · non-contrast
Comparison: Portable chest and chest CT 03/24/2020 and earlier.

CLINICAL DATA: 64-year-old male with respiratory failure. Prior
[id number]. Right pneumothorax.

EXAM:
PORTABLE CHEST 1 VIEW

[chest ap]
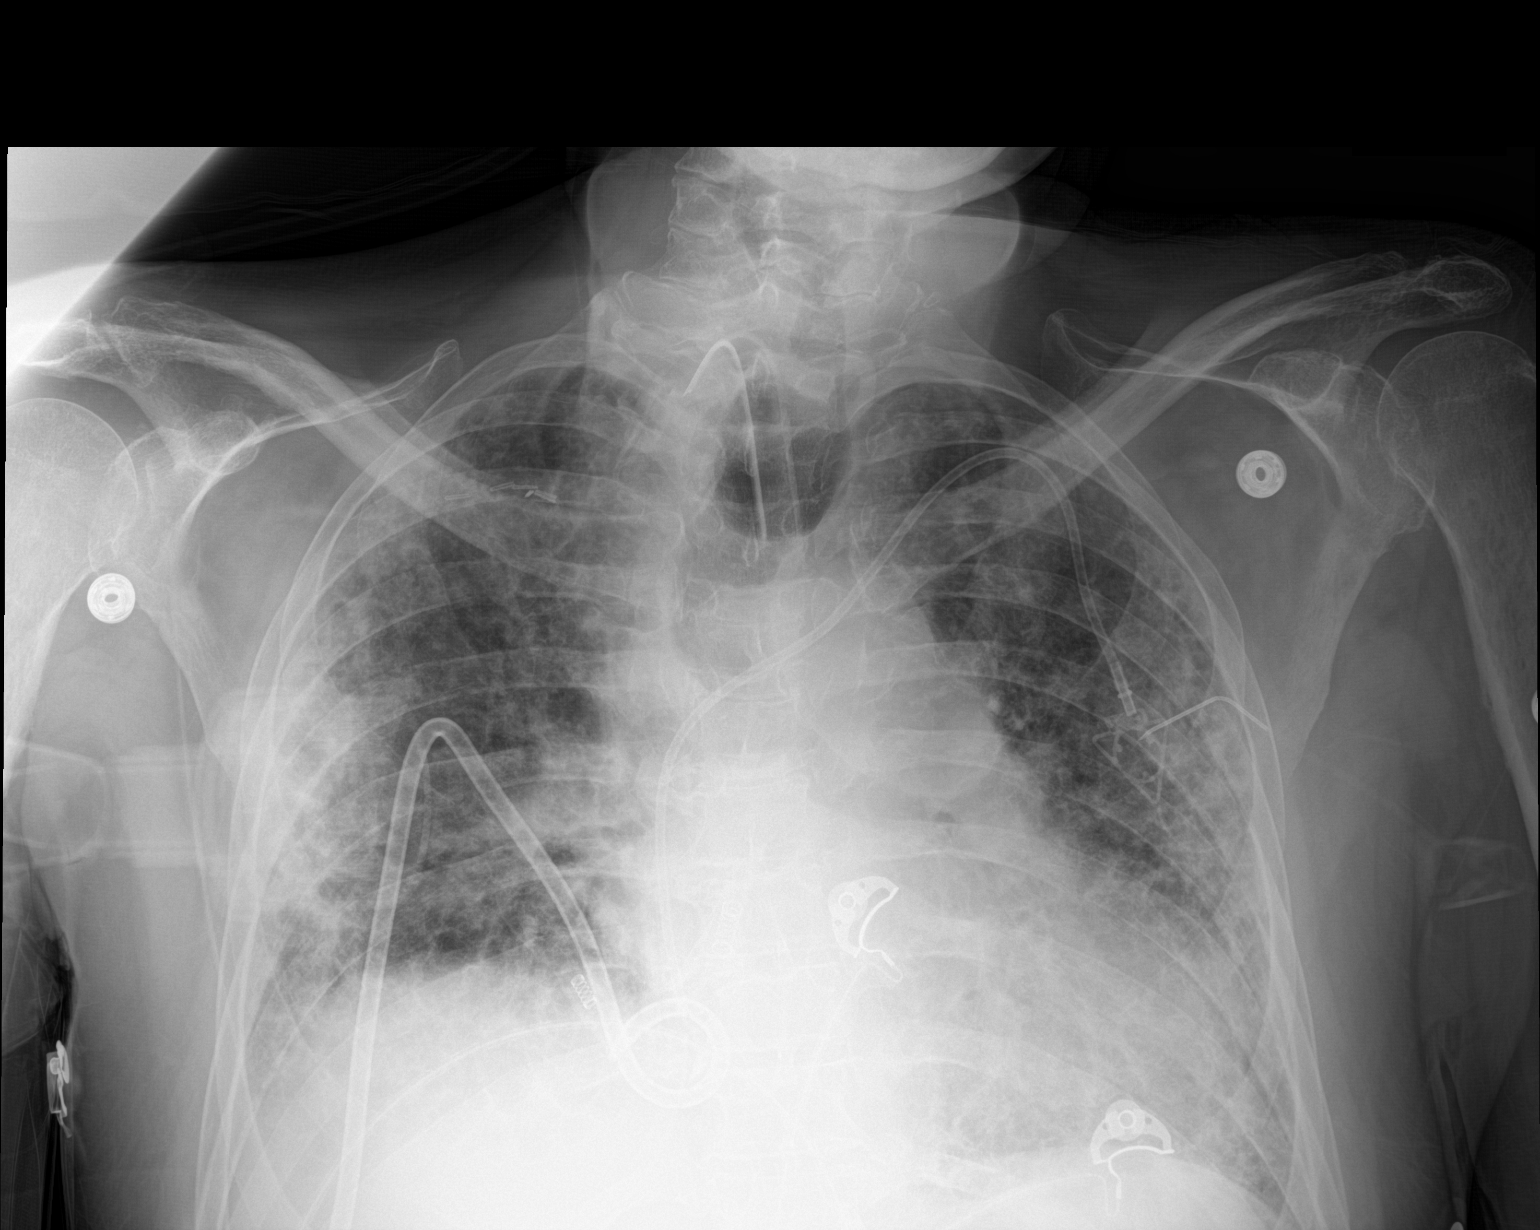

[1 of 1 positions shown; findings below may reference images not displayed]

FINDINGS: Portable AP semi upright view at 4197 hours. Stable tracheostomy
tube. Stable left chest power port. Stable right side pigtail chest
tube. Low lung volumes with coarse bilateral pulmonary opacity,
confluent peripheral opacity in both lungs. No pneumothorax
identified. Stable ventilation since yesterday. Stable cardiac size
and mediastinal contours.
IMPRESSION: 1. Stable lines and tubes. No pneumothorax identified.
2. Stable ventilation with bilateral lung disease/pulmonary fibrosis
demonstrated by CT yesterday.

## 2022-09-11 IMAGING — DX DG CHEST 1V PORT
1 series · 1 of 1 positions shown · non-contrast
Comparison: 03/25/2020

CLINICAL DATA: Respiratory failure.

EXAM:
PORTABLE CHEST 1 VIEW

[chest ap]
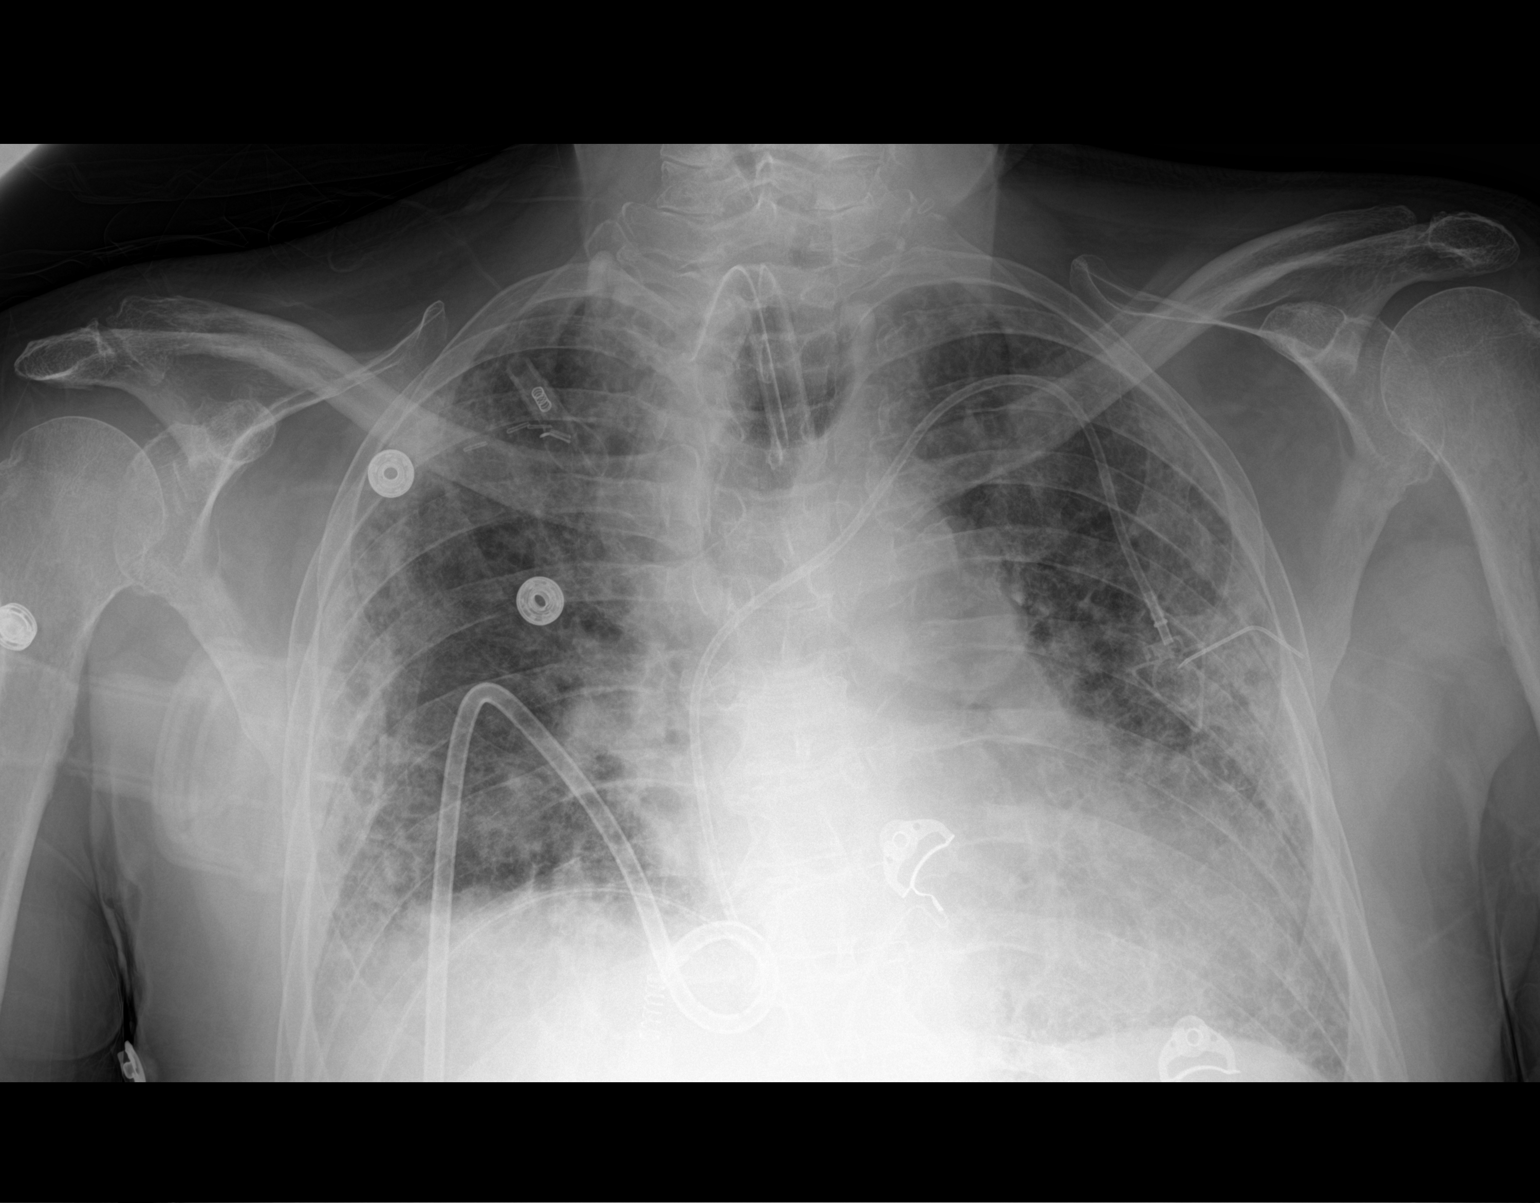

[1 of 1 positions shown; findings below may reference images not displayed]

FINDINGS: 6429 hours. Low volumes with asymmetric elevation right
hemidiaphragm. Right pleural drain remains in place without evidence
for right-sided pneumothorax. Tracheostomy tube and left Port-A-Cath
again noted. Diffuse interstitial and peripherally oriented
ground-glass airspace disease is stable in the interval.
IMPRESSION: Stable exam. Right pleural drain remains in place without evidence
for right-sided pneumothorax.

## 2022-09-11 IMAGING — DX DG CHEST 1V PORT
1 series · 1 of 1 positions shown · non-contrast
Comparison: March 26, 2020 study obtained earlier in the day.
Chest CT March 24, 2020

CLINICAL DATA: Tracheostomy exchange

EXAM:
PORTABLE CHEST 1 VIEW

[chest ap]
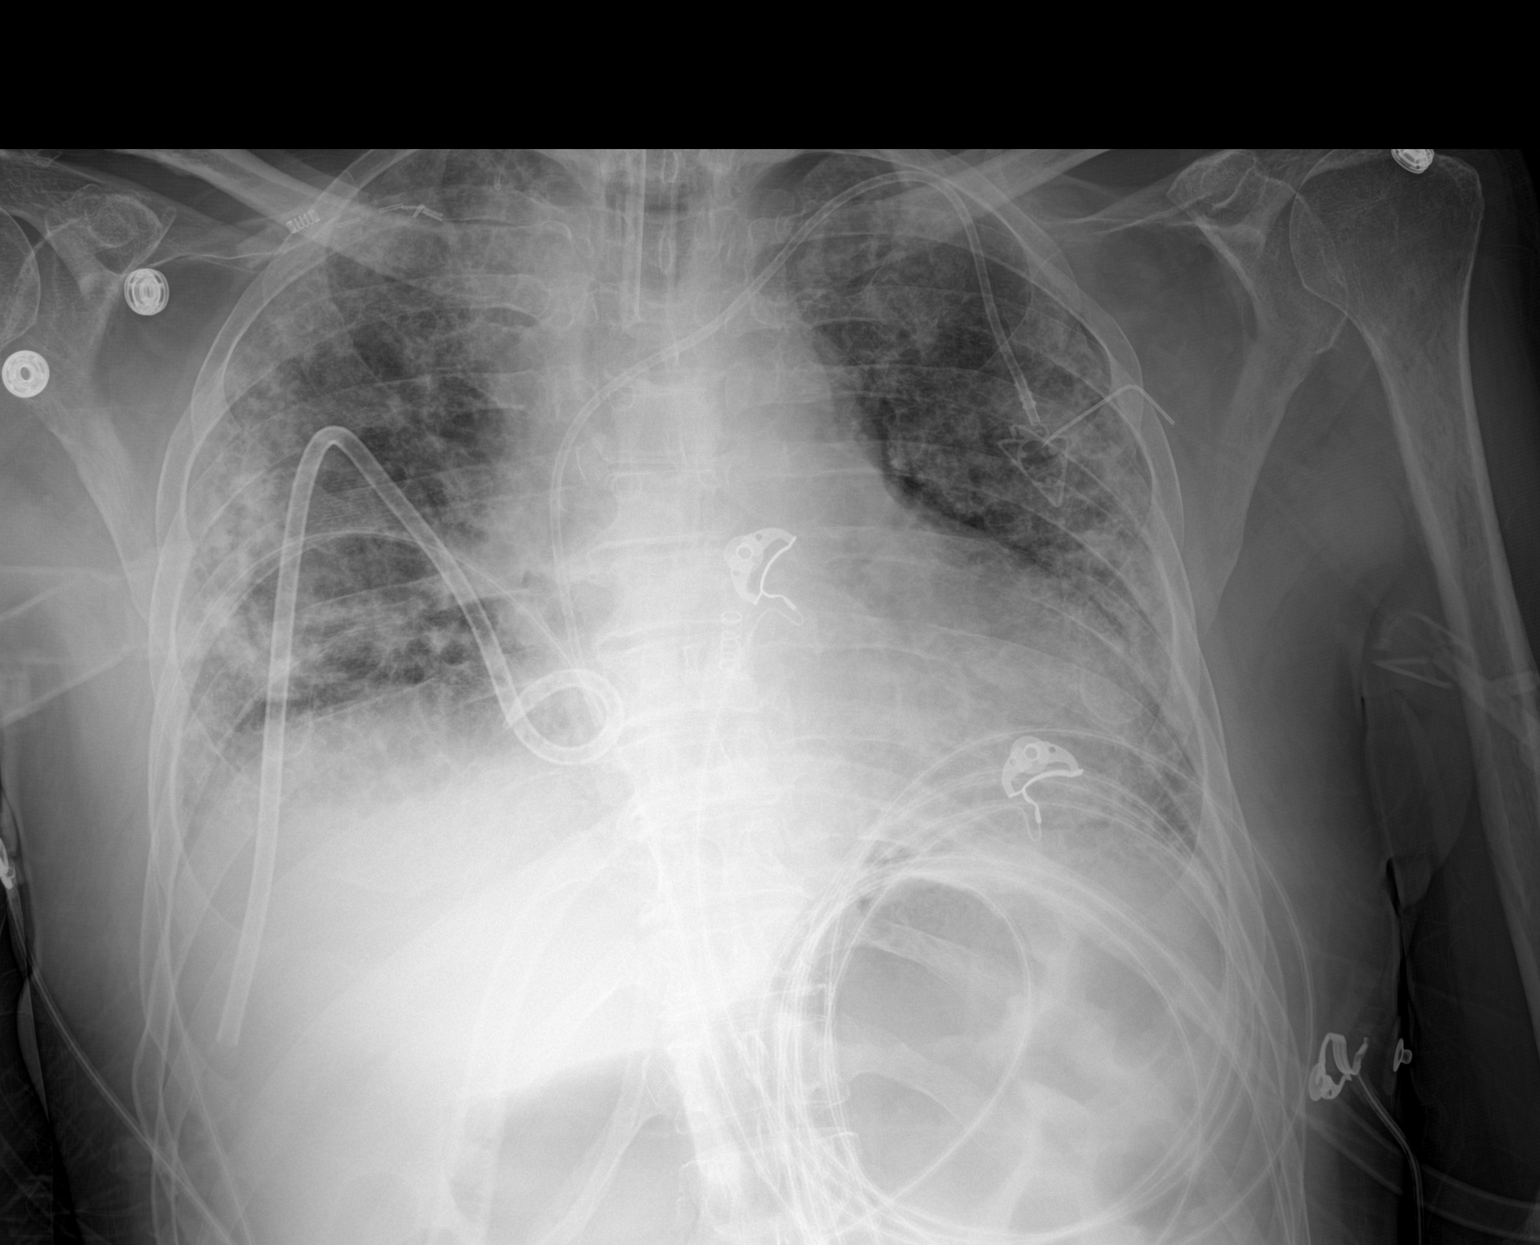

[1 of 1 positions shown; findings below may reference images not displayed]

FINDINGS: Tracheostomy catheter tip is 3.6 cm above the carina. Chest tube
present on the right. Port-A-Cath tip is in the right atrium,
stable. No pneumothorax. Patchy interstitial and airspace opacity
bilaterally persists without change. Heart is upper normal in size
with pulmonary vascularity normal. No adenopathy. No bone lesions.
IMPRESSION: Tube and catheter positions as described without evident
pneumothorax. Interstitial and airspace opacity remain bilaterally
without change. Recent CT examination revealed extensive fibrosis.
Current appearance is stable and may be entirely due to fibrosis. It
is difficult to exclude ill-defined superimposed infiltrate in this
circumstance.

## 2022-09-11 IMAGING — DX DG CHEST 1V
1 series · 1 of 1 positions shown · non-contrast
Comparison: Earlier today

CLINICAL DATA: Status post chest tube removal

EXAM:
CHEST  1 VIEW

[chest ap]
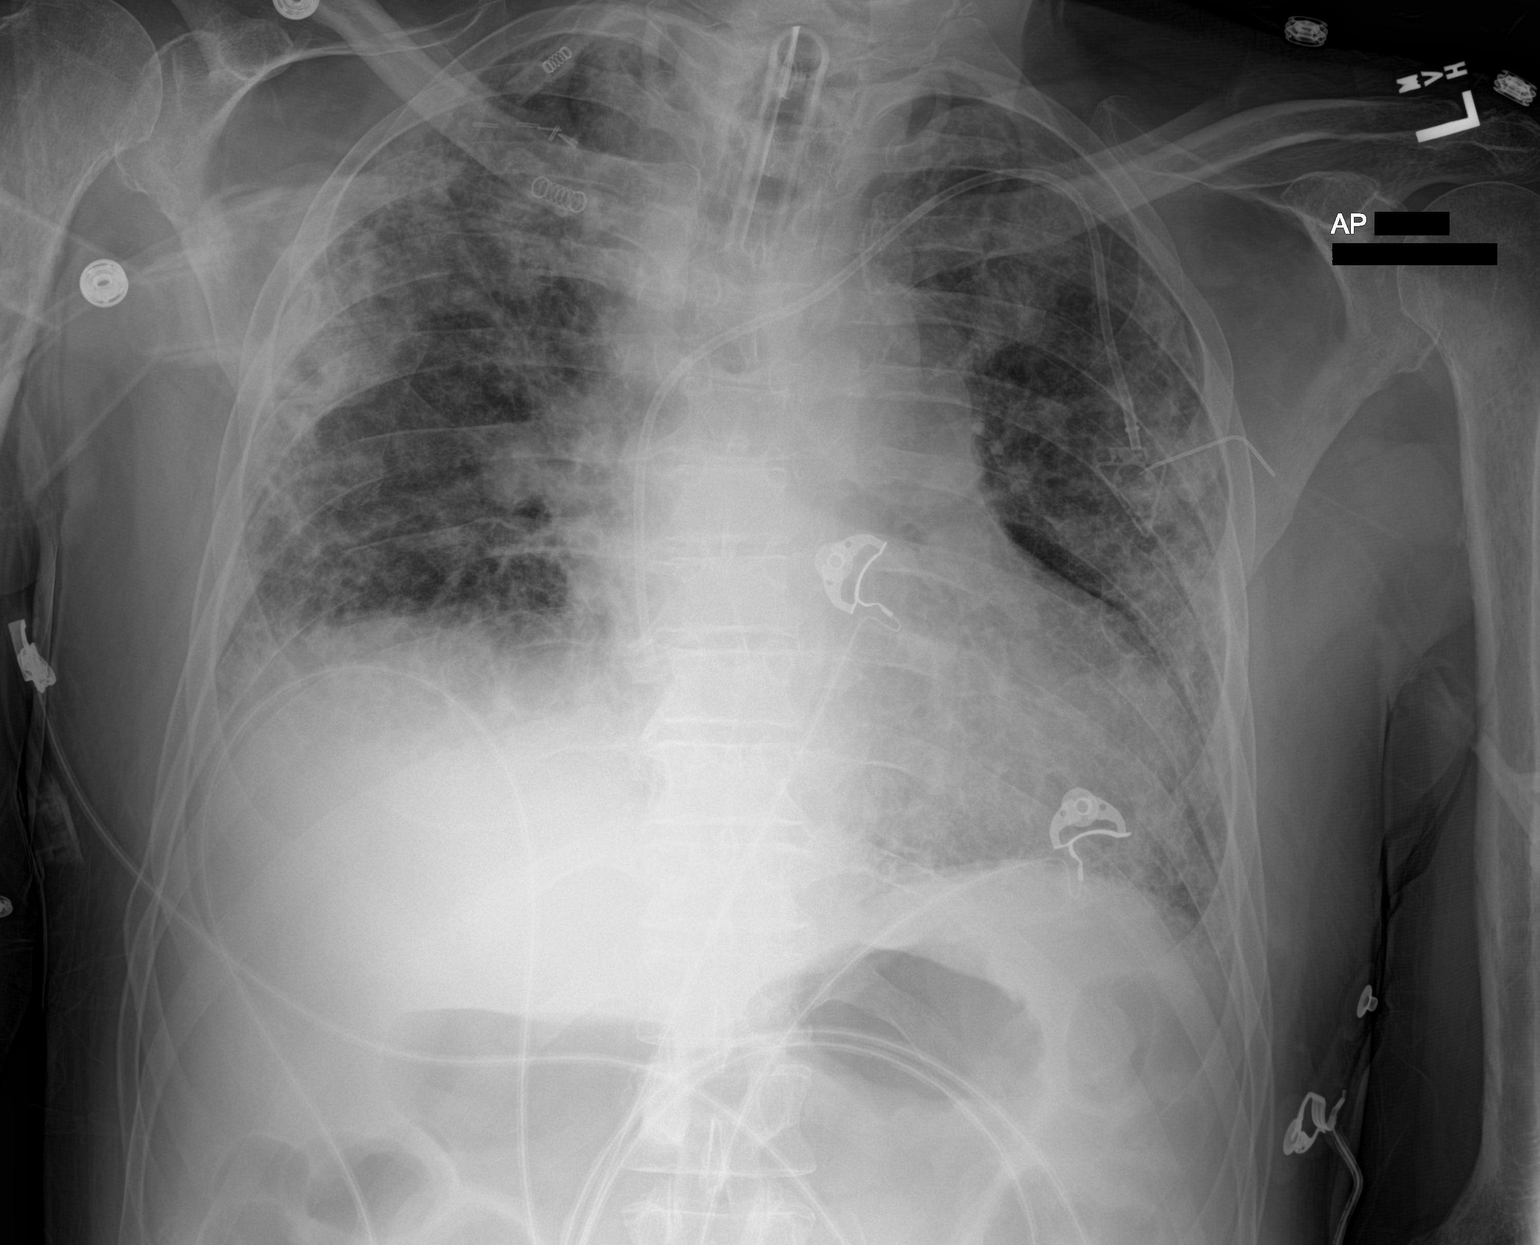

[1 of 1 positions shown; findings below may reference images not displayed]

FINDINGS: Tracheostomy tube tip is above the carina. There is a left chest
wall port a catheter with tip in the cavoatrial junction. Stable
cardiomediastinal contours. Diffuse bilateral peripheral predominant
interstitial and airspace opacities are unchanged. The right-sided
chest tube is been removed. No pneumothorax identified.
IMPRESSION: 1. No pneumothorax status post chest tube removal.
2. No change in aeration to the lungs compared with prior exam.

## 2022-09-12 IMAGING — DX DG CHEST 1V PORT
1 series · 1 of 1 positions shown · non-contrast
Comparison: 03/26/2020

CLINICAL DATA: Follow-up pneumothorax

EXAM:
PORTABLE CHEST 1 VIEW

[chest ap]
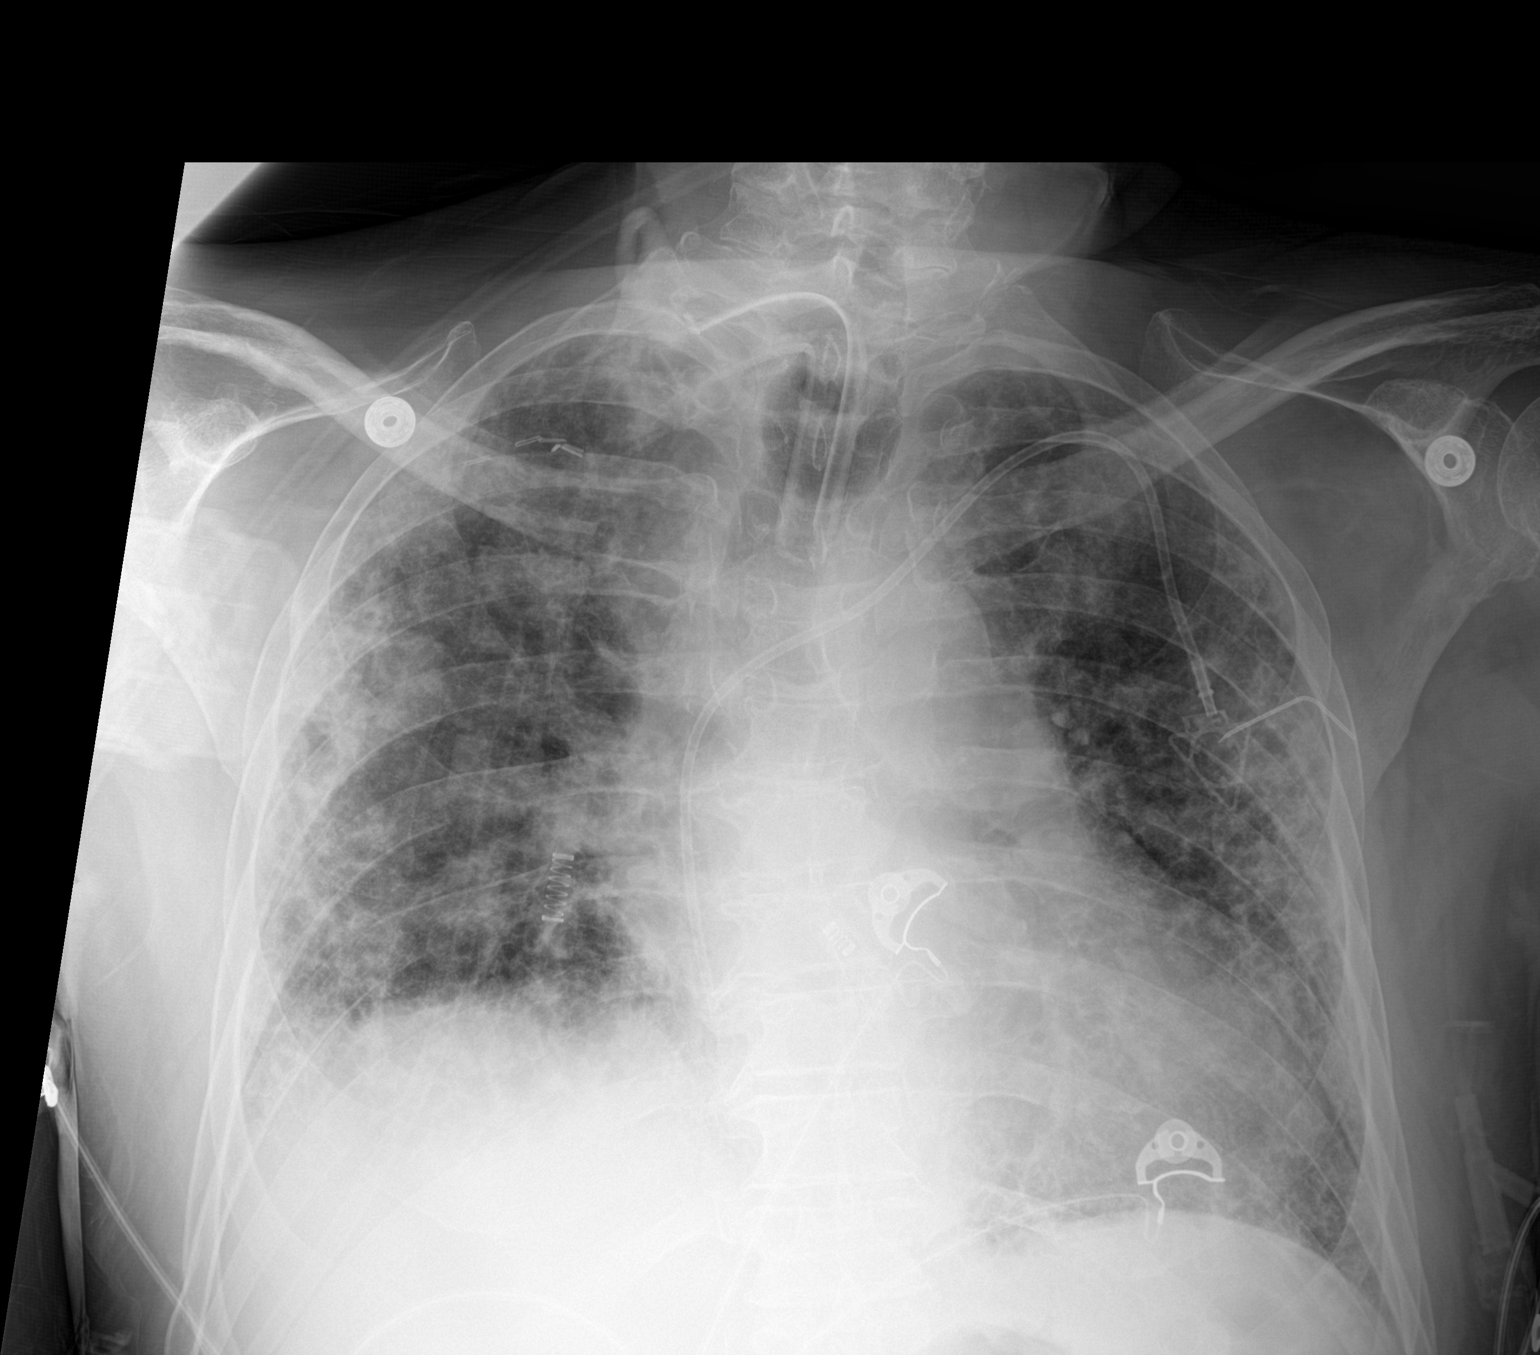

[1 of 1 positions shown; findings below may reference images not displayed]

FINDINGS: Tracheostomy tube and left-sided chest wall port are again seen and
stable. Diffuse bilateral airspace opacities are again identified
stable from the prior exam. No sizable pneumothorax is seen. No new
focal abnormality is noted.
IMPRESSION: Stable bilateral airspace opacities. No new focal abnormality noted.

## 2022-09-13 IMAGING — DX DG CHEST 1V PORT
1 series · 1 of 1 positions shown · non-contrast
Comparison: 03/27/2020

CLINICAL DATA: Acute respiratory failure with hypoxemia.

EXAM:
PORTABLE CHEST 1 VIEW

[chest ap]
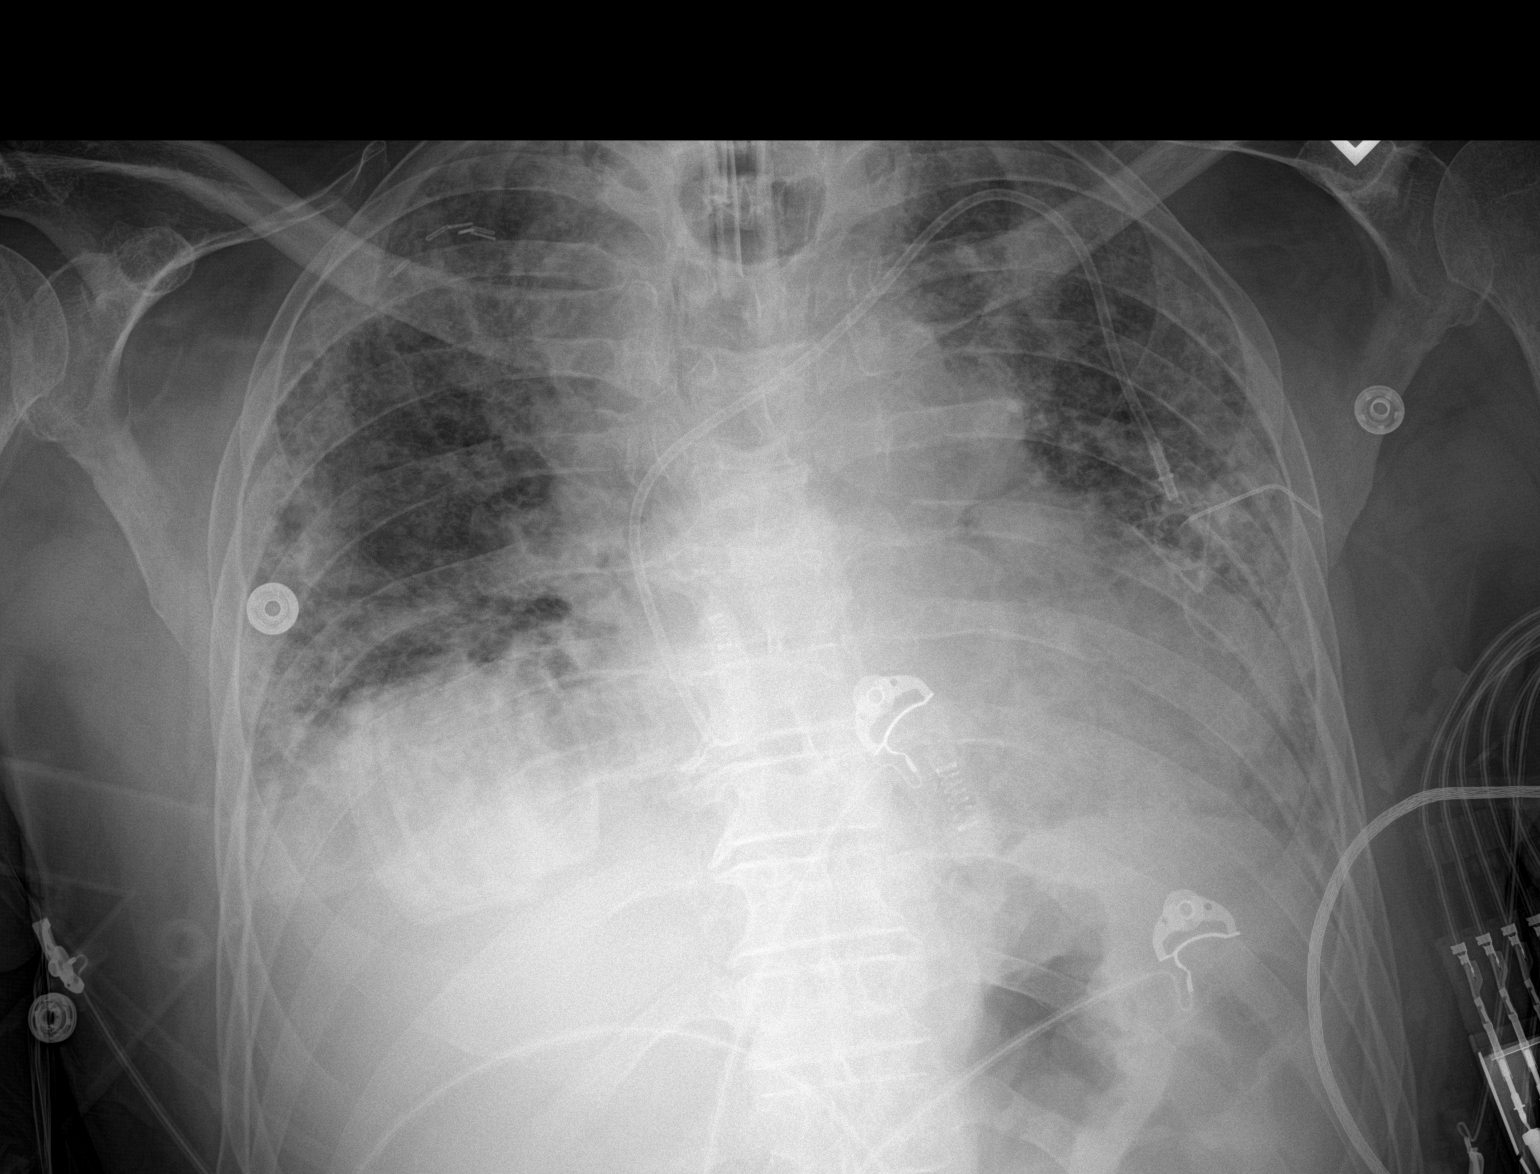

[1 of 1 positions shown; findings below may reference images not displayed]

FINDINGS: Tracheostomy tube and left-sided Port-A-Cath remains in appropriate
position. Low lung volumes are again seen. Bilateral airspace
disease with predominance in the peripheral lung zones shows no
significant change. Mild cardiomegaly stable. No evidence of
pneumothorax or pleural effusion
IMPRESSION: No significant change in bilateral airspace disease with peripheral
predominance, and mild cardiomegaly.

## 2022-09-16 IMAGING — DX DG CHEST 1V PORT
1 series · 1 of 1 positions shown · non-contrast
Comparison: 03/28/2020

CLINICAL DATA: Follow-up ARDS

EXAM:
PORTABLE CHEST 1 VIEW

[chest ap]
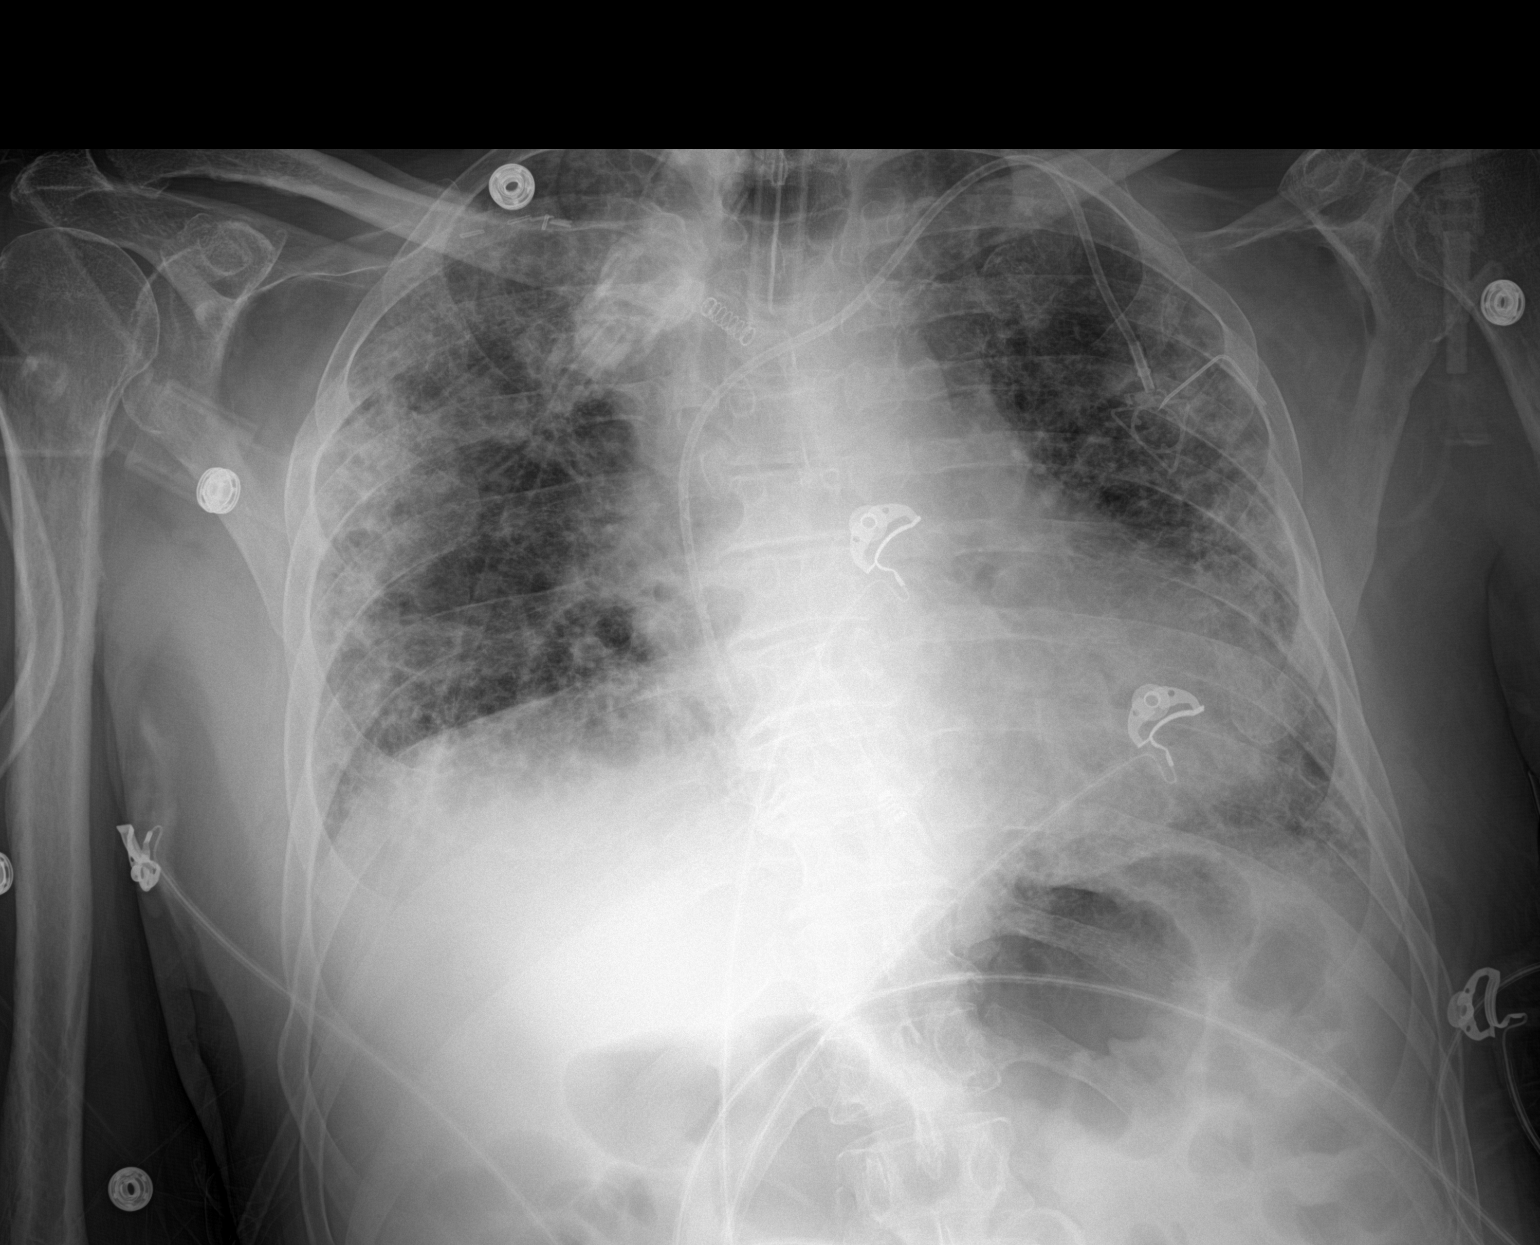

[1 of 1 positions shown; findings below may reference images not displayed]

FINDINGS: Cardiac shadow is enlarged but stable. Tracheostomy tube and left
chest wall port are again seen and stable. Diffuse bilateral
airspace disease is again identified stable in appearance from the
prior exam. No new focal abnormality is seen.
IMPRESSION: Stable bilateral airspace opacity.

## 2022-11-10 IMAGING — CT CT ANGIO CHEST
2 of 6 series · 17 of 36 positions shown · IV contrast (OMNIPAQUE 350)
Comparison: Radiograph earlier today. Chest CT 03/24/2020. PET CT
06/14/2019

CLINICAL DATA: PE suspected, low/intermediate prob, positive
D-dimer

Shortness of breath with decreased oxygen saturation.
EXAM:
CT ANGIOGRAPHY CHEST WITH CONTRAST
TECHNIQUE: Multidetector CT imaging of the chest was performed using the
standard protocol during bolus administration of intravenous
contrast. Multiplanar CT image reconstructions and MIPs were
obtained to evaluate the vascular anatomy.
CONTRAST:  80mL OMNIPAQUE IOHEXOL 350 MG/ML SOLN

[Series 5: thins · axial · 0.65mm/px · z∈[+1356,+1564]mm · 16 of 235 slices shown]
[im 14/235  lung]
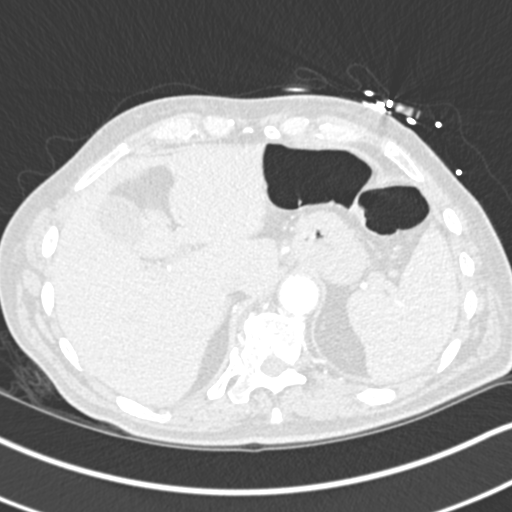
[im 27/235  mediastinal]
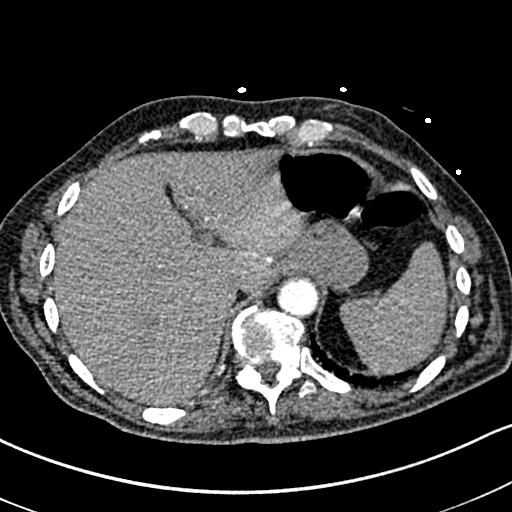
[im 40/235  lung]
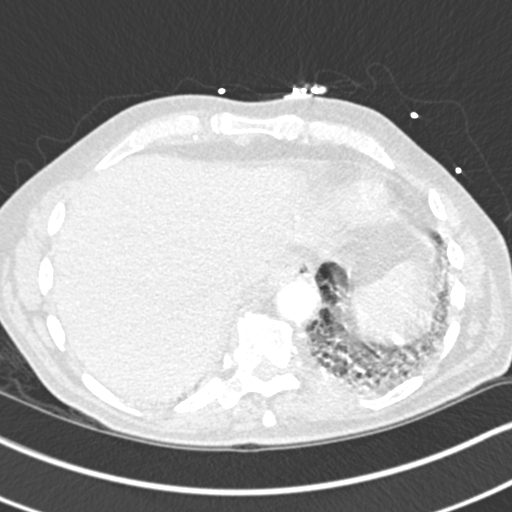
[im 53/235  mediastinal]
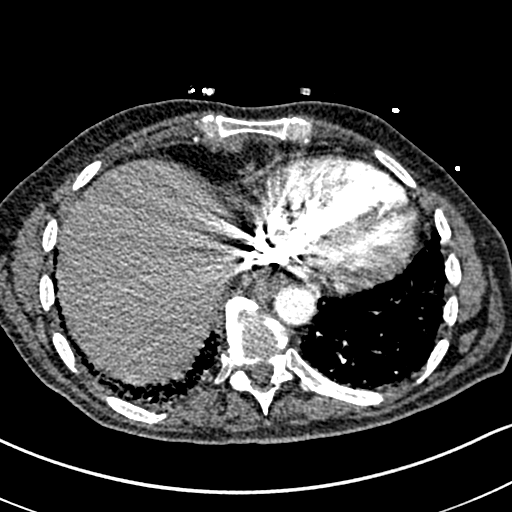
[im 66/235  lung]
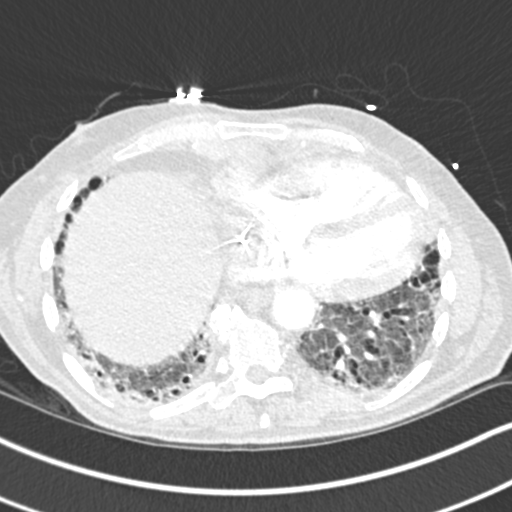
[im 79/235  mediastinal]
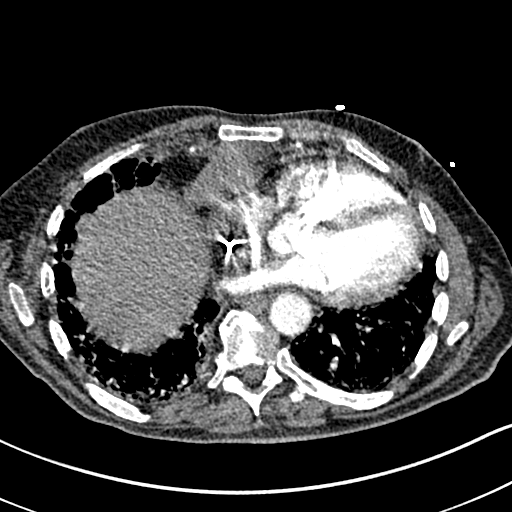
[im 92/235  lung]
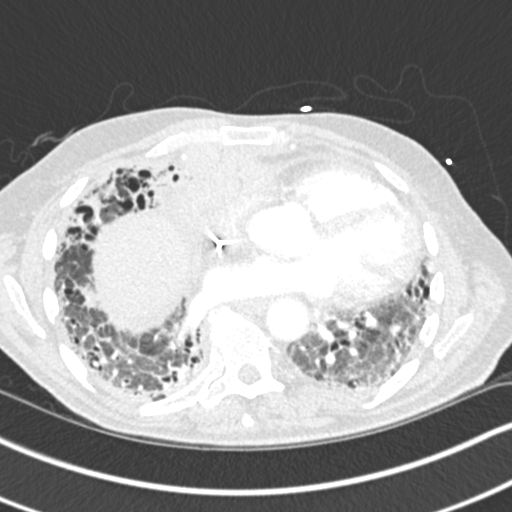
[im 105/235  mediastinal]
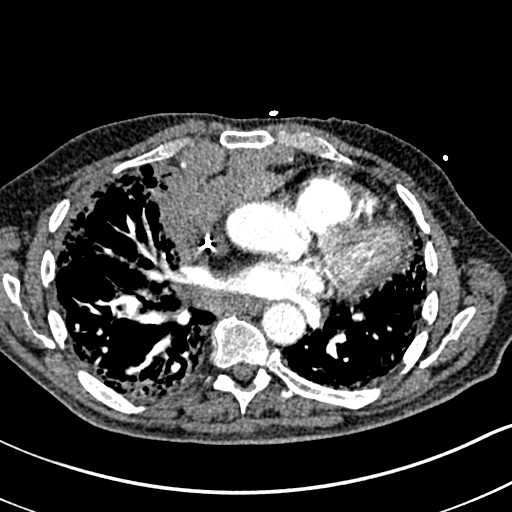
[im 131/235  lung]
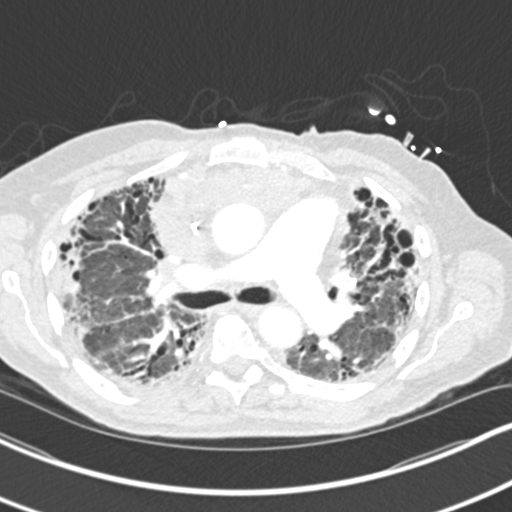
[im 144/235  mediastinal]
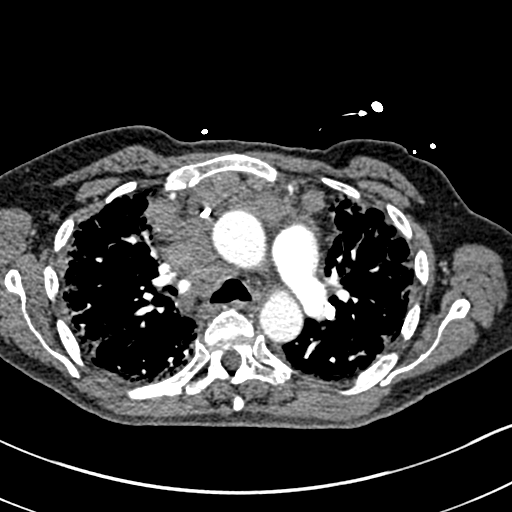
[im 157/235  lung]
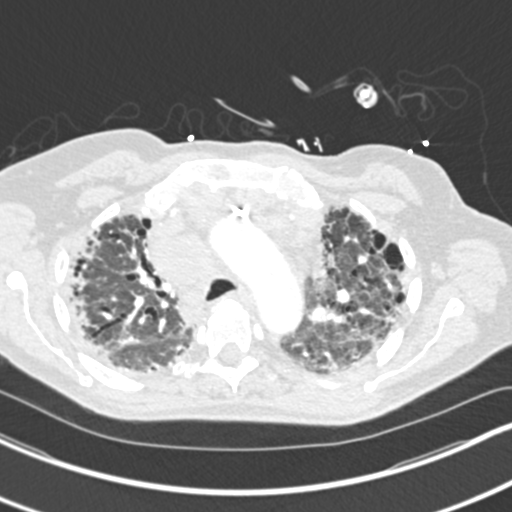
[im 170/235  mediastinal]
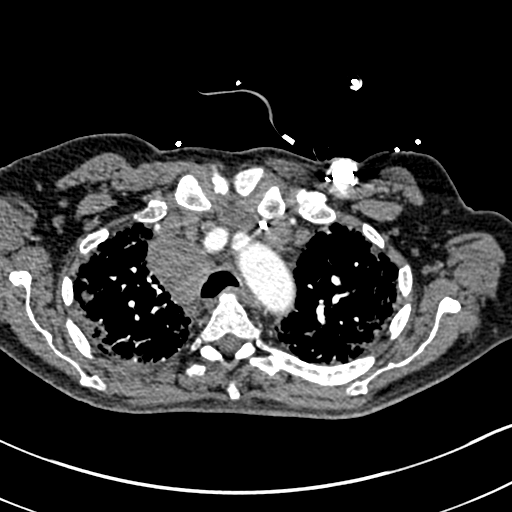
[im 183/235  lung]
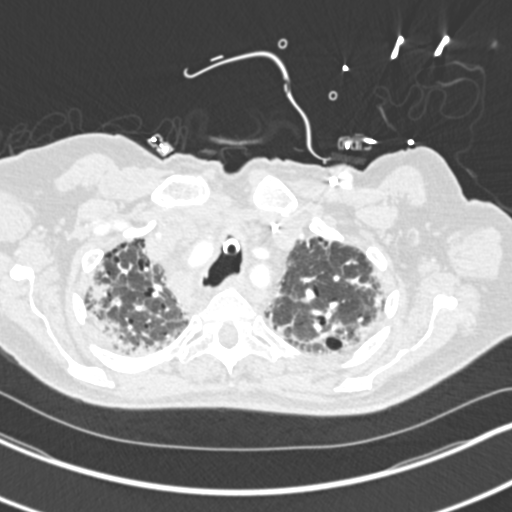
[im 196/235  mediastinal]
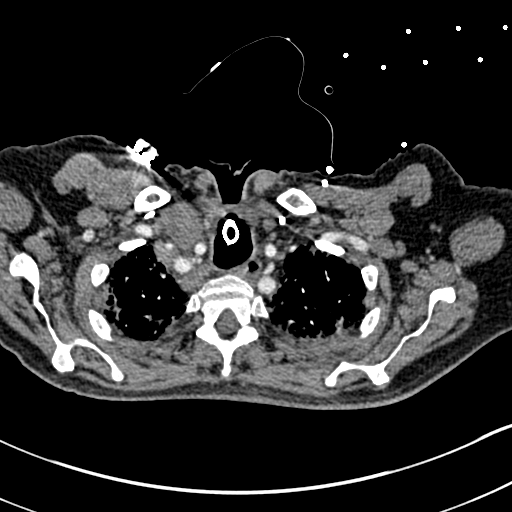
[im 209/235  lung]
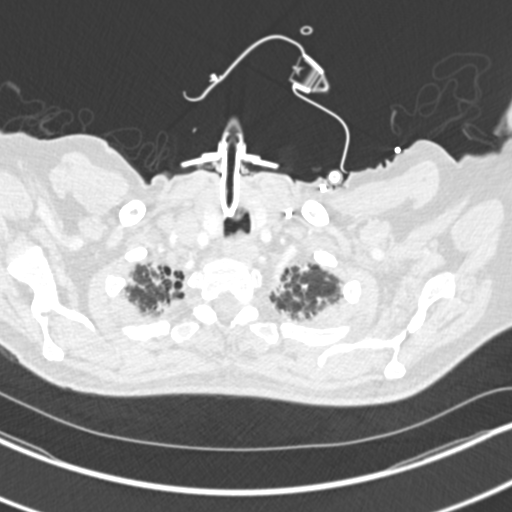
[im 222/235  mediastinal]
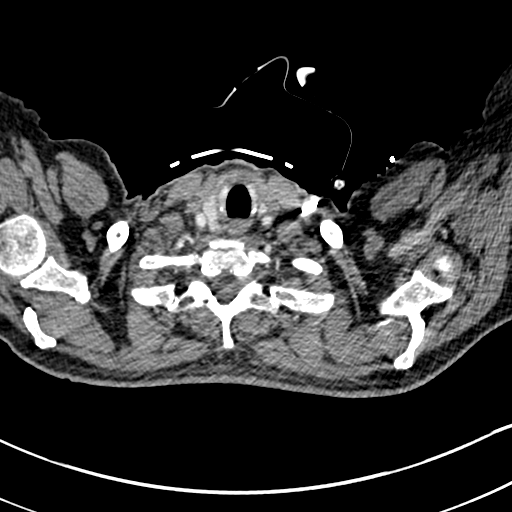

[Series 7: coronal mpr · coronal · 0.47mm/px · 1 of 111 slices shown]
[im 56/111  mediastinal]
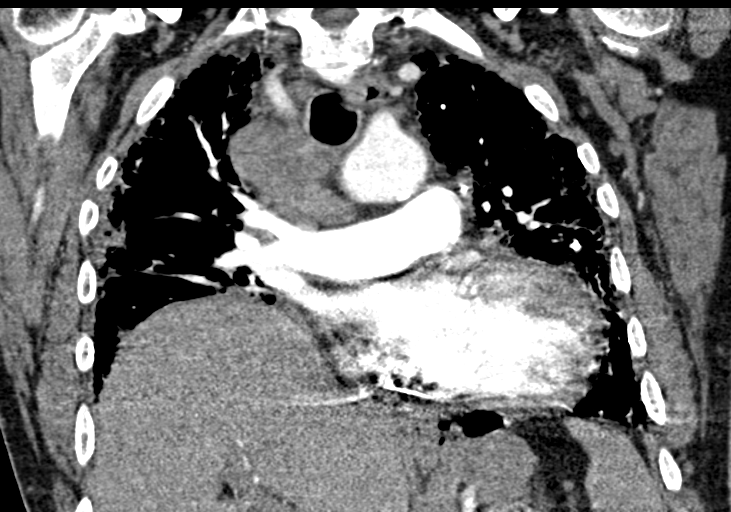

[17 of 36 positions shown; findings below may reference images not displayed]

FINDINGS: Cardiovascular: There are no filling defects within the pulmonary
arteries to suggest pulmonary embolus. Subsegmental branches are not
well assessed due to breathing motion artifact. Dilatation of the
main pulmonary artery at 3.5 cm. Mild aortic atherosclerosis. No
acute aortic findings. Left chest port with tip at the atrial caval
junction. Mild cardiomegaly. No pericardial effusion.

Mediastinum/Nodes: Extensive mediastinal adenopathy that has
progressed from prior exam. Representative node or nodal
conglomerate in the right lower paratracheal station measures 3.9 x
4.0 cm, previously 3.2 x 3.6 cm. High right mediastinal/low
supraclavicular node measures 2.3 cm short axis, previously 1.7 cm
by my retrospective measurement. Progressive soft tissue density
presumed nodal tissue in the right anterior mediastinum anterior to
the aorta and IVC. Enlarged right internal mammary node adjacent to
the lower sternum measures 2.1 cm. No axillary adenopathy.

Tracheostomy with tip above the carina. Minimal retained mucus in
the trachea. No esophageal wall thickening. No visualized thyroid
nodule.

Lungs/Pleura: Extensive chronic lung disease with multifocal
bronchiectasis, peripheral honeycombing, and diffuse ground-glass
opacities throughout the lung parenchyma. Calcified granuloma in the
left lower lobe. No evidence of acute airspace disease. No
significant pleural effusion. Previous pigtail catheter in the right
anterior chest has been removed. No pneumothorax.

Upper Abdomen: Small retrosternal lymph node measures 6 mm, series
4, image 99. Limited imaging of the upper abdomen without other
acute finding.

Musculoskeletal: No acute osseous abnormality or focal bone lesion.
Thoracic spine spondylosis.

Review of the MIP images confirms the above findings.
IMPRESSION: 1. No pulmonary embolus. Dilatation of the main pulmonary artery
consistent with pulmonary arterial hypertension.
2. Progressive thoracic adenopathy. This is greater than typically
seen with reactive adenopathy, and suspicious for Hodgkin's lymphoma
recurrence.
3. Extensive interstitial lung disease with pulmonary fibrosis and
bronchiectasis. No acute pulmonary findings.

Aortic Atherosclerosis ([id number]-RS0.0).

## 2022-11-10 IMAGING — DX DG CHEST 1V PORT
1 series · 1 of 1 positions shown · non-contrast
Comparison: Most recent radiograph 04/07/2020

CLINICAL DATA: Shortness of breath.

EXAM:
PORTABLE CHEST 1 VIEW

[chest ap]
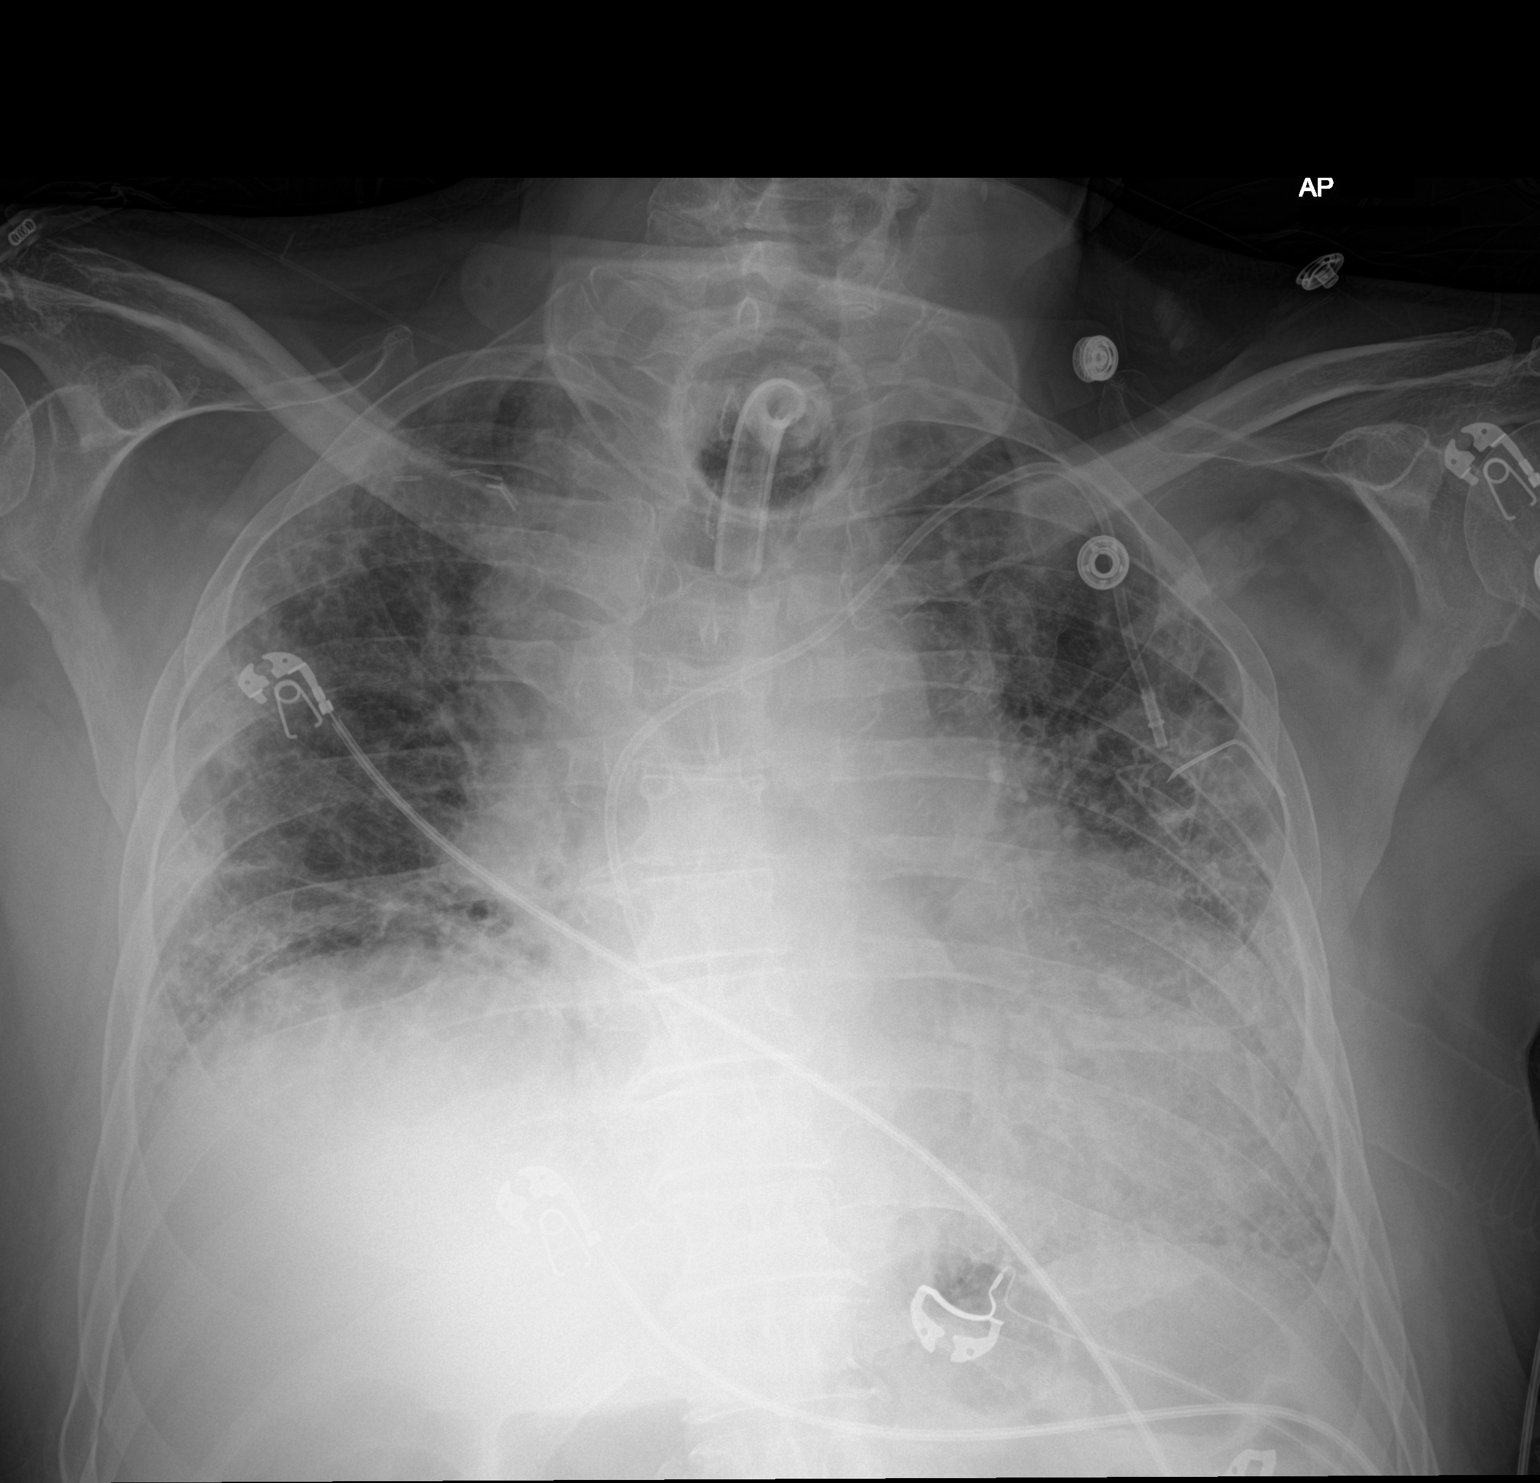

[1 of 1 positions shown; findings below may reference images not displayed]

FINDINGS: Tracheostomy tube tip at the thoracic inlet. Left chest port remains
in place. Chronic elevation of right hemidiaphragm. Interstitial
lung disease with peripheral predominant reticulation, chronic and
unchanged. No new airspace disease. Stable cardiomegaly and
mediastinal contours. No pneumothorax.
IMPRESSION: 1. Stable chronic interstitial lung disease. No new abnormalities.
2. Stable cardiomegaly.
3. Tracheostomy tube unchanged with tip at the thoracic inlet.

## 2022-12-05 IMAGING — PT NM PET TUM IMG RESTAG (PS) SKULL BASE T - THIGH
1 of 7 series · 1 of 25 positions shown · non-contrast
Comparison: June 14, 2019

CLINICAL DATA: Subsequent treatment strategy for Hodgkin's lymphoma
mediastinal adenopathy.

EXAM:
NUCLEAR MEDICINE PET SKULL BASE TO THIGH
TECHNIQUE: 8.0 mCi F-18 FDG was injected intravenously. Full-ring PET imaging
was performed from the skull base to thigh after the radiotracer. CT
data was obtained and used for attenuation correction and anatomic
localization.
Fasting blood glucose: 114 mg/dl

[Series 4: ct sk_thigh 5.0 bf37 · axial · 5.0mm · 0.98mm/px · 1 of 236 slices shown]
[im 236/236  brain]
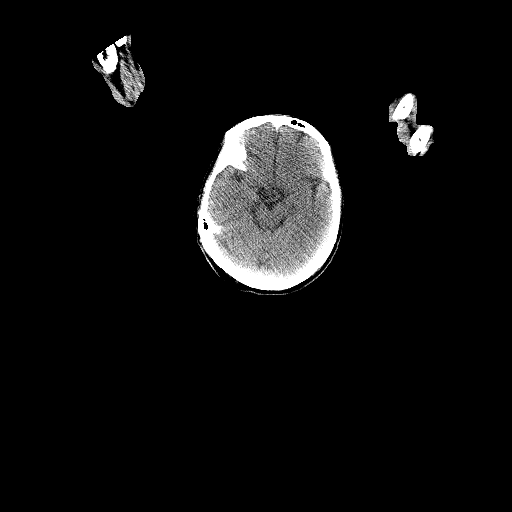

[1 of 25 positions shown; findings below may reference images not displayed]

FINDINGS: Mediastinal blood pool activity: SUV max

Liver activity: SUV max

Neck:: Hypermetabolic RIGHT supraclavicular node.  See below

Incidental CT findings: none

CHEST: Intense metabolic activity associated with bulky anterior
mediastinal substernal lymph nodes which are new from prior. Example
RIGHT internal mammary node measuring 2.2 cm with SUV max equal
13.9. There is a mat of intense hypermetabolic nodes within the
anterior mediastinum measuring approximately 8 cm in total.

RIGHT supraclavicular node is hypermetabolic and enlarged measuring
1.9 cm with SUV max equal 10.9.

Incidental CT findings: Tracheostomy tube in good position. Chronic
interstitial lung disease noted. No hypermetabolic pulmonary nodules

ABDOMEN/PELVIS: Spleen is normal size and normal metabolic activity.
No hypermetabolic upper abdominal, nodes

Hypermetabolic RIGHT common iliac lymph node with SUV max equal 9.9.
Hypermetabolic RIGHT external iliac lymph node measures 17 mm with
SUV max equal 11.6 (image 162).

Incidental CT findings: Percutaneous gastrostomy tube noted.
Bilateral renal calculi. Calculus in the RIGHT renal pelvis without
high-grade obstruction.

SKELETON: There is new foci of intense radiotracer activity within
the skeleton. For example broad lesion in the posterior LEFT iliac
bone with SUV max equal 11.5. Lesion on image 212. Lesion measures
approximately 3 cm but not evident on the CT portion exam. A similar
lesion in the central sacrum with SUV max equal 9.5. Lesion in the
tip of the RIGHT iliac wing with SUV max equal 13.4. Additional
lesions scattered in the spine and ribs.

Incidental CT findings: none
IMPRESSION: 1. Unfortunately, evidence of lymphoma recurrence with new bulky
substernal anterior mediastinal adenopathy which is intensely
hypermetabolic ([HOSPITAL] 5).
2. Hypermetabolic RIGHT supraclavicular node if tissue sampling
warranted.
3. New intensely hypermetabolic multifocal skeletal metastasis.
4. Incidental findings of chronic interstitial lung disease,
bilateral nephrolithiasis and RIGHT renal pelvis calculus.

## 2022-12-30 IMAGING — CR DG CHEST 2V
2 series · 2 of 2 positions shown · non-contrast
Comparison: Chest x-ray 05/25/2020

CLINICAL DATA: Shortness of breath.

EXAM:
CHEST - 2 VIEW

[w chest lat]
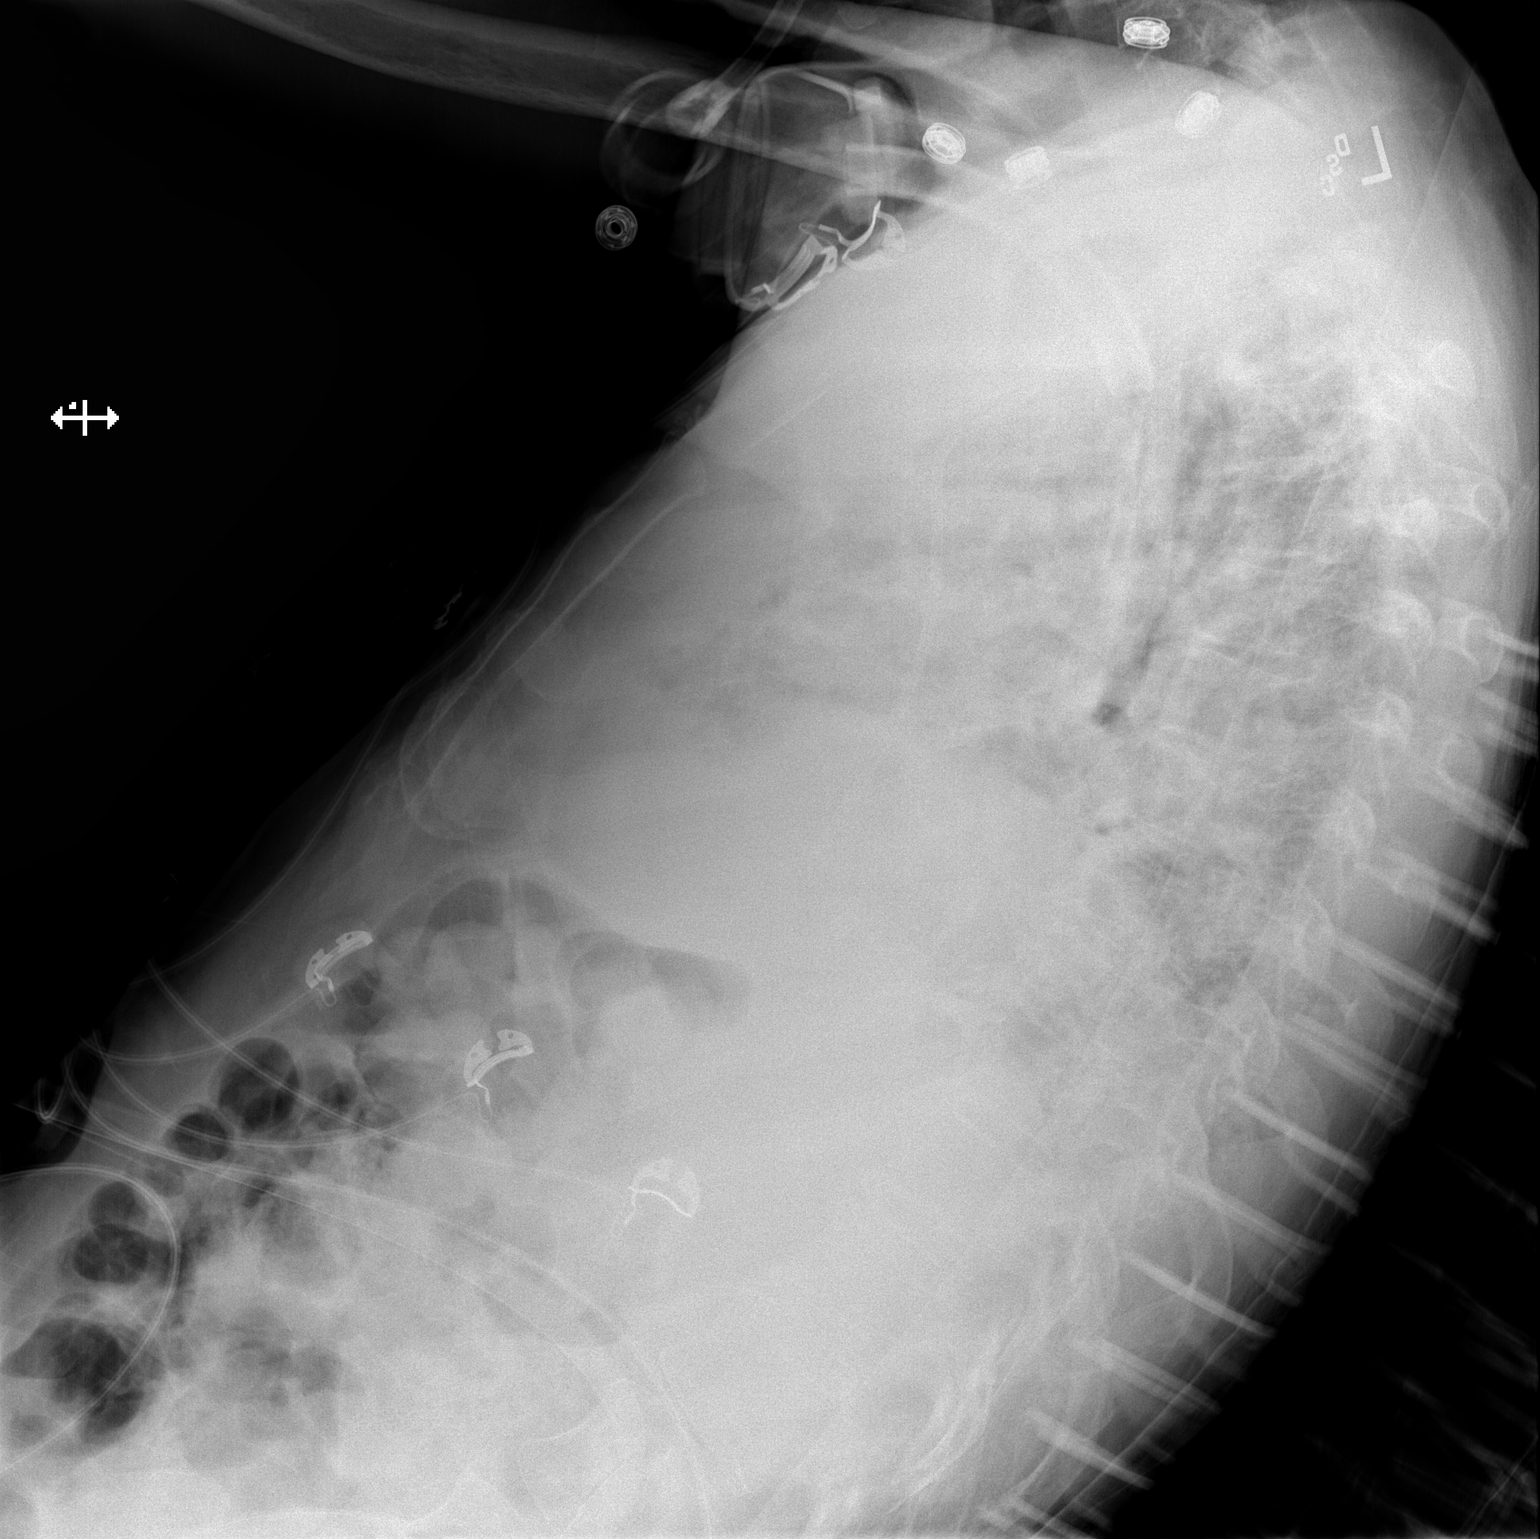

[x chest ap]
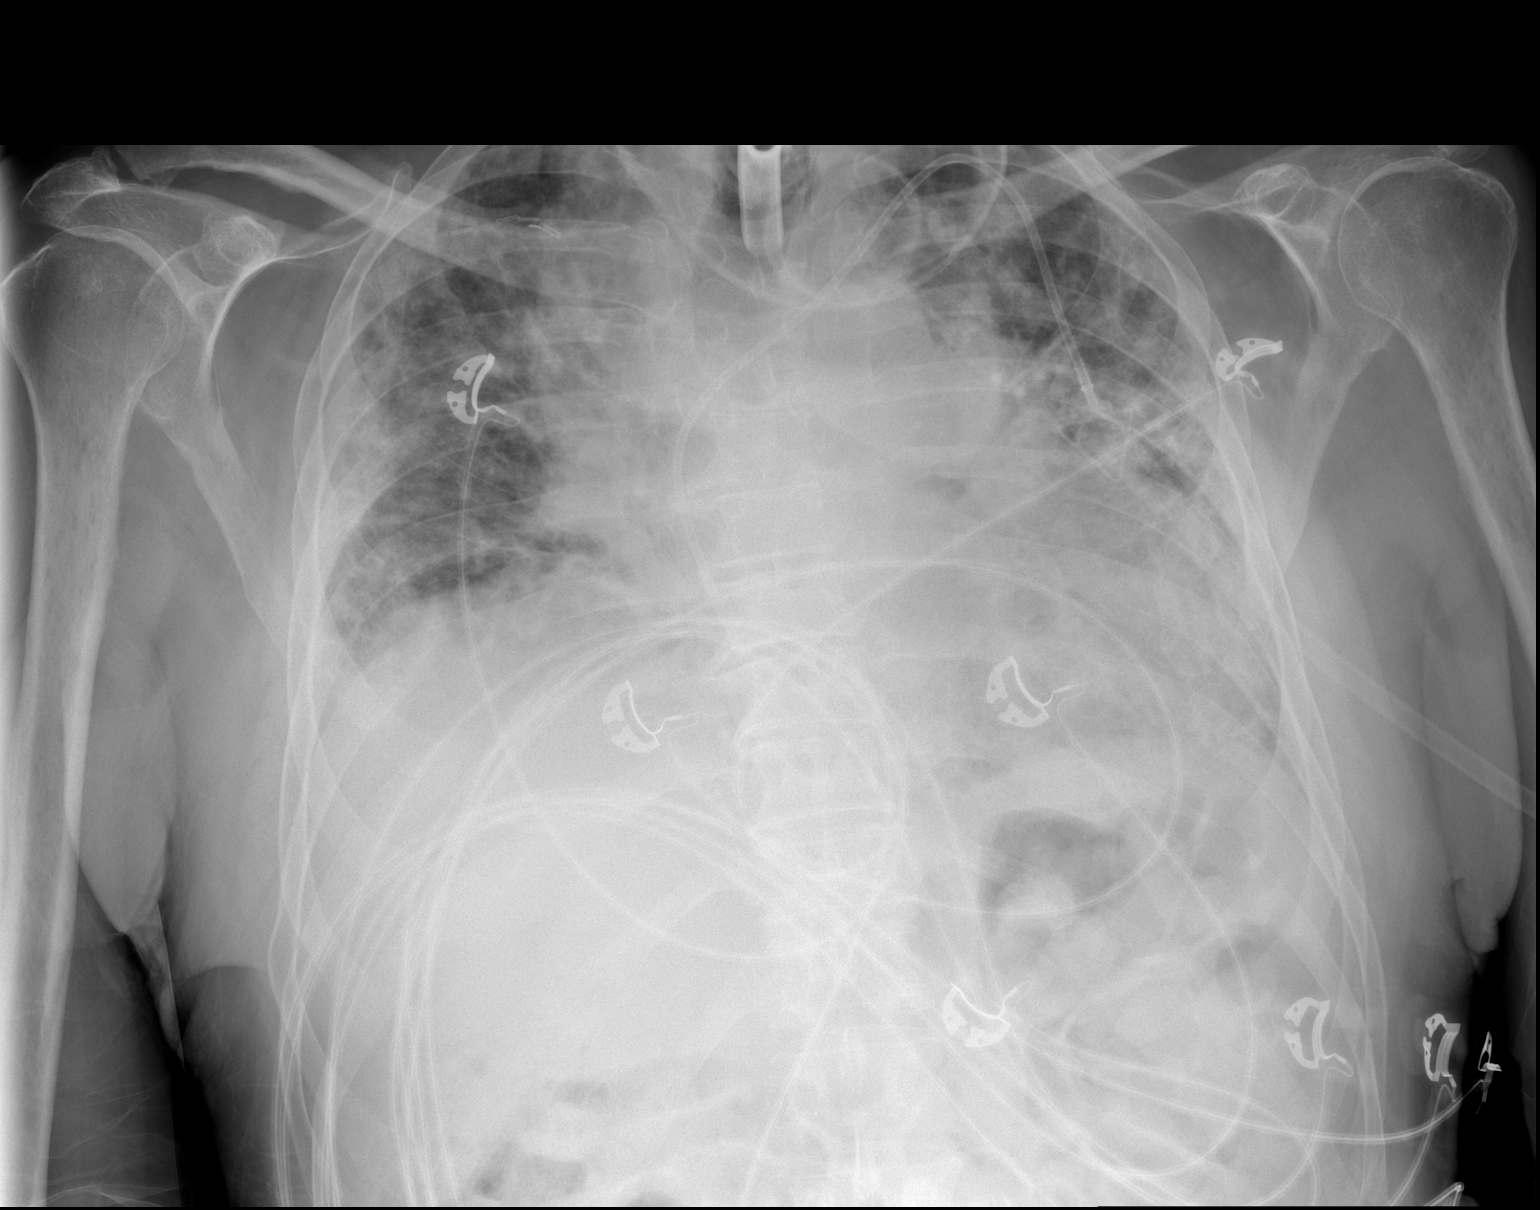

[2 of 2 positions shown; findings below may reference images not displayed]

FINDINGS: The tracheostomy tube is in good position, unchanged.

The power port is in good position, unchanged.

Stable mild cardiac enlargement and prominent mediastinal and hilar
contours.

Severe chronic underlying lung disease likely post pneumonic
pulmonary fibrosis. Low lung volumes with vascular crowding and
atelectasis. Slight increase in patchy airspace opacity suspicious
for superimposed infiltrates. No definite pleural effusions.
IMPRESSION: Severe chronic underlying lung disease with suspected superimposed
infiltrates.

## 2023-01-06 IMAGING — DX DG CHEST 1V PORT
1 series · 1 of 1 positions shown · non-contrast
Comparison: 07/14/2020

CLINICAL DATA: Dyspnea

EXAM:
PORTABLE CHEST 1 VIEW

[chest ap]
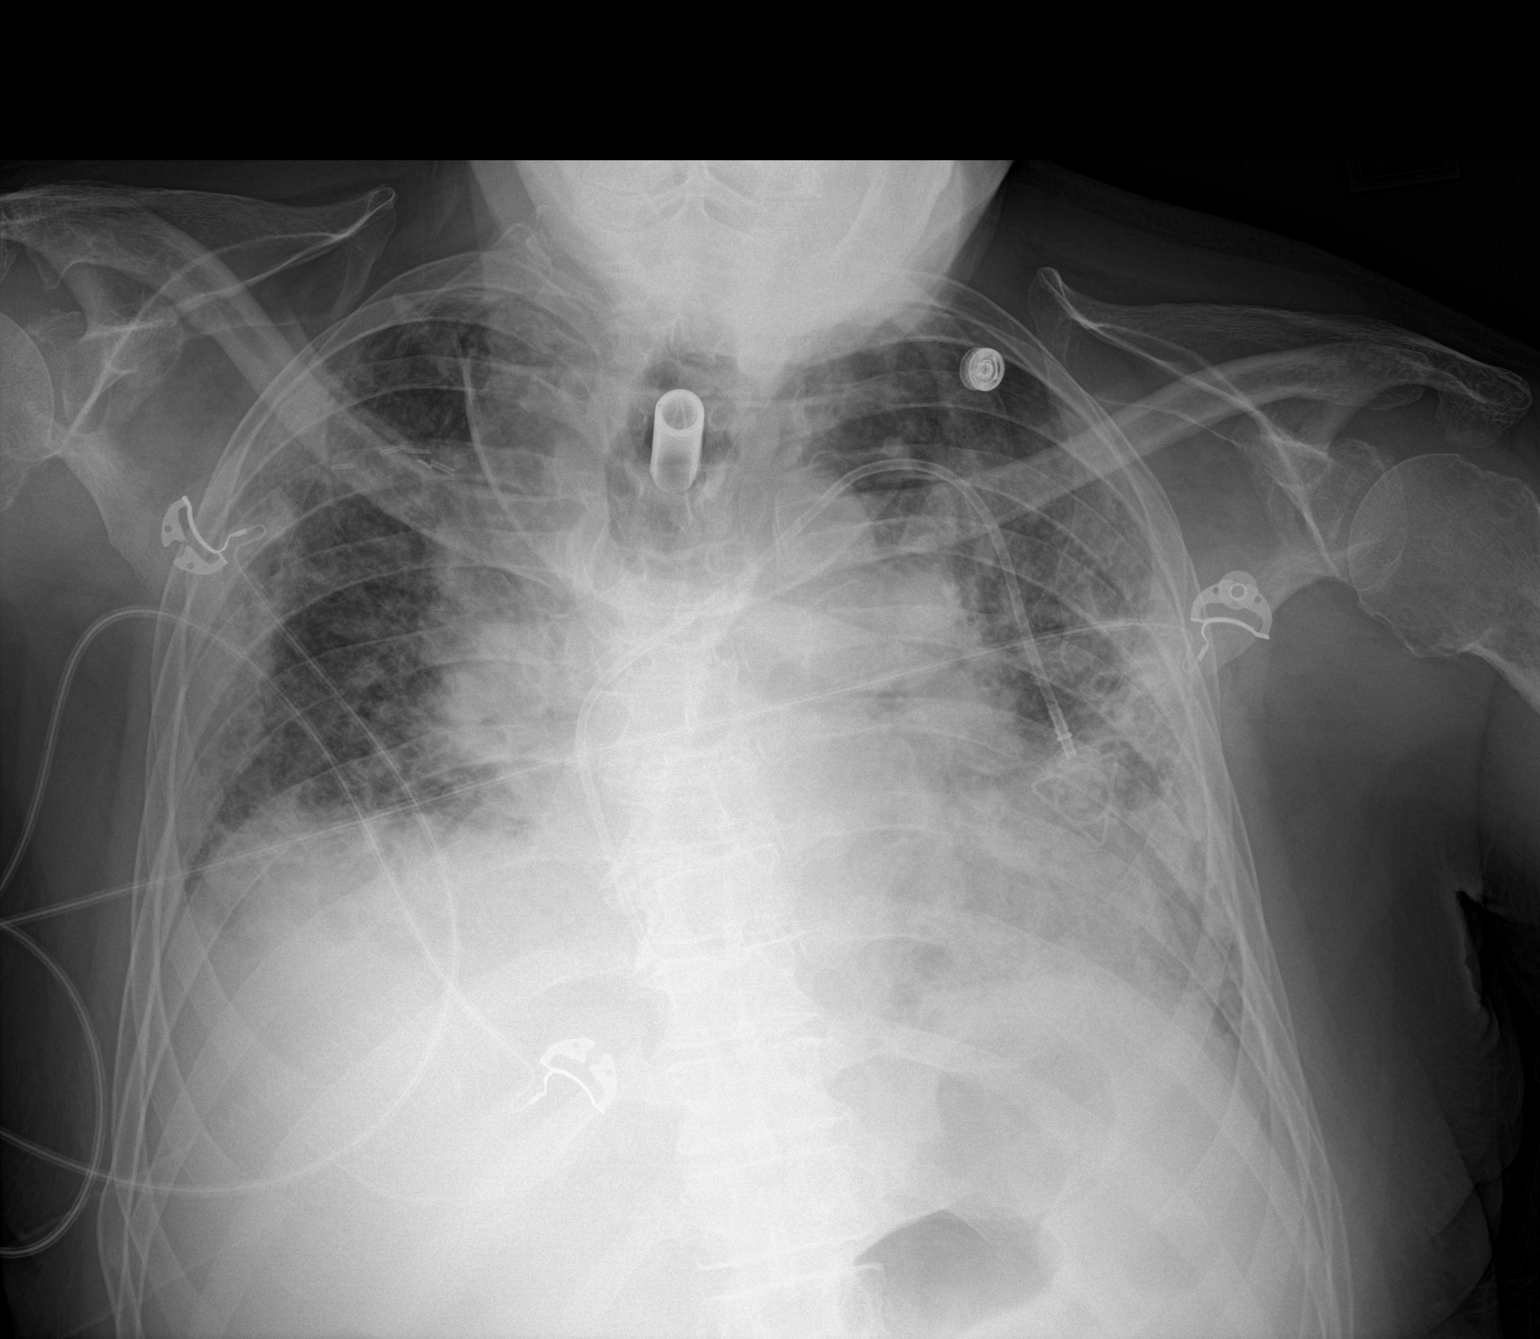

[1 of 1 positions shown; findings below may reference images not displayed]

FINDINGS: Tracheostomy and left internal jugular chest port with its tip
within the right atrium are unchanged. Lung volumes are extremely
small with asymmetric right-sided volume loss with elevation of the
right hemidiaphragm, however, pulmonary insufflation remain stable
since prior examination. Superimposed coarse pulmonary infiltrates
appears slightly improved since prior examination, likely infectious
or inflammatory in nature. No pneumothorax or pleural effusion.
Cardiac size is within normal limits when accounting for poor
pulmonary insufflation. Bilateral hilar enlargement is unchanged.
IMPRESSION: Stable pulmonary hypoinflation.

Slight interval improvement in bilateral coarse pulmonary
infiltrates, likely infectious or inflammatory in nature.
Superimposed peripheral pulmonary infiltrates in keeping with
underlying interstitial lung disease better appreciated on prior CT
examination of 05/25/2020 are again noted.

## 2023-01-06 IMAGING — CT CT CHEST W/O CM
2 of 4 series · 15 of 36 positions shown, 18 images · non-contrast
Comparison: 05/25/2020.  PET 06/19/2020.

CLINICAL DATA: Cough, respiratory failure.

EXAM:
CT CHEST WITHOUT CONTRAST
TECHNIQUE: Multidetector CT imaging of the chest was performed following the
standard protocol without IV contrast.

[Series 2: thorax · axial · 0.78mm/px · z∈[-259,-3]mm · 12 of 148 slices shown, 15 images]
[im 10/148  mediastinal]
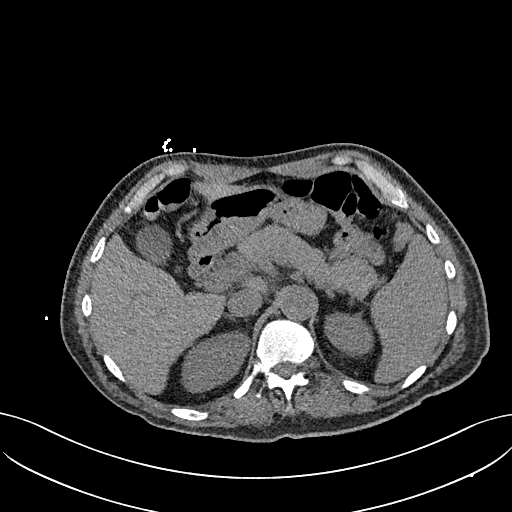
[im 10/148  lung]
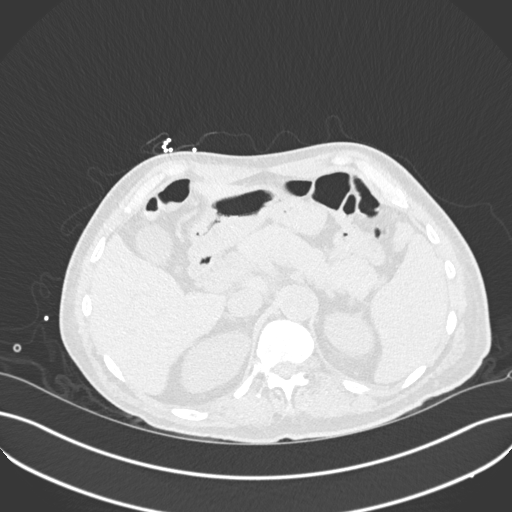
[im 20/148  lung]
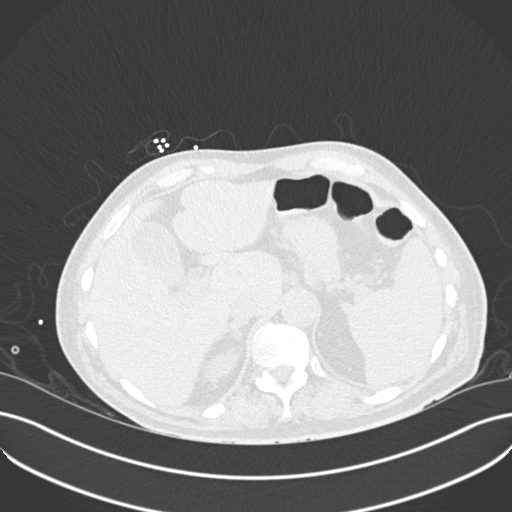
[im 30/148  lung]
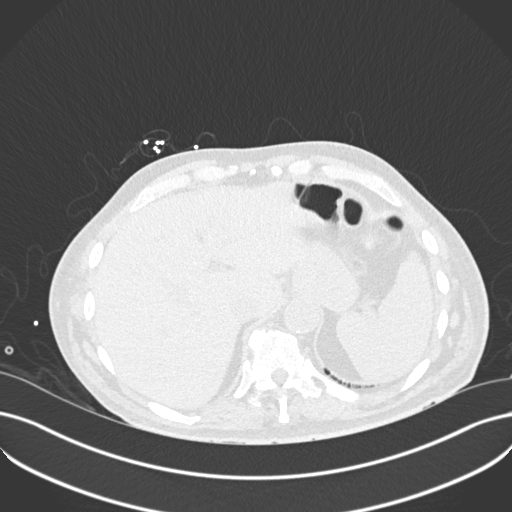
[im 50/148  lung]
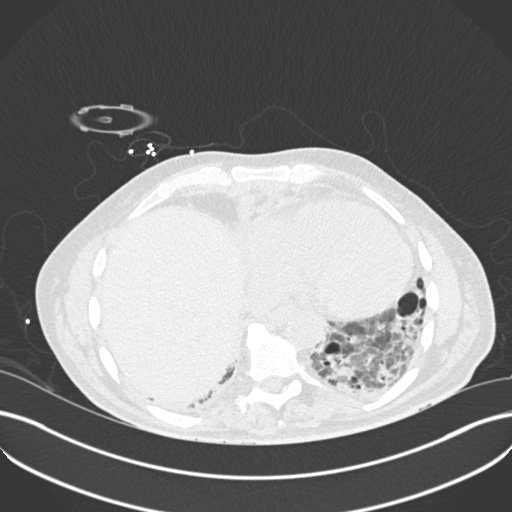
[im 59/148  mediastinal]
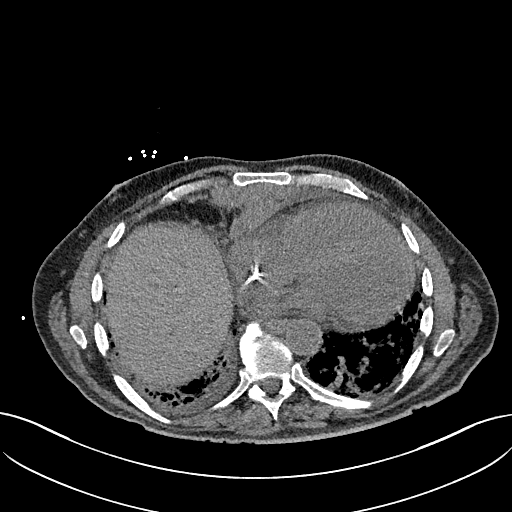
[im 59/148  lung]
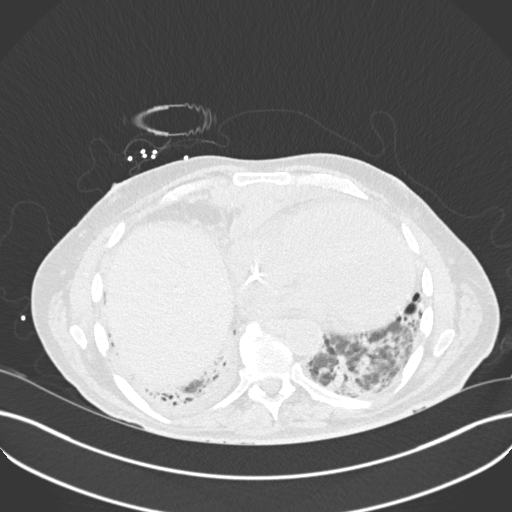
[im 69/148  lung]
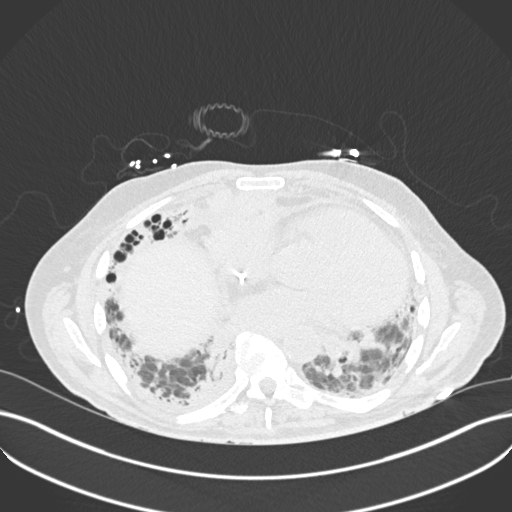
[im 79/148  lung]
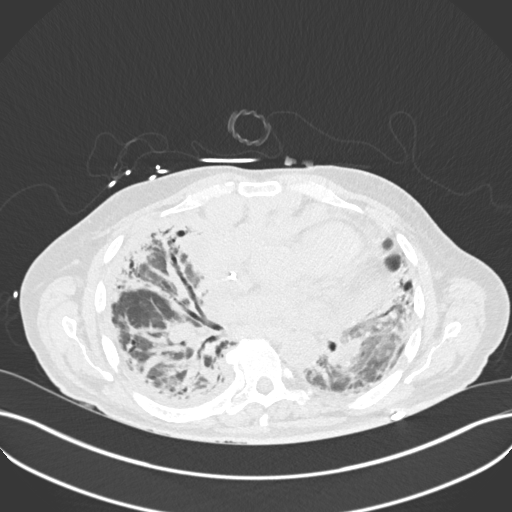
[im 89/148  lung]
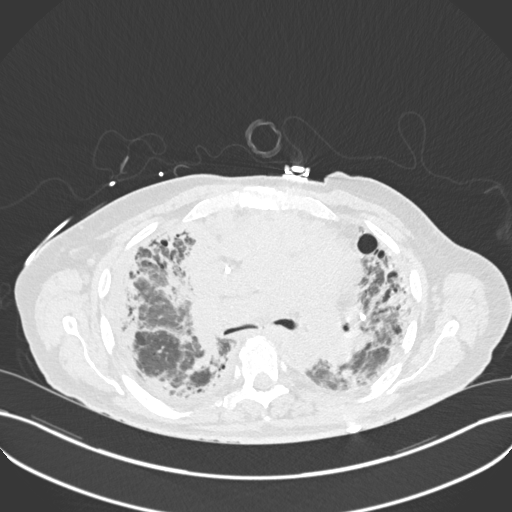
[im 99/148  mediastinal]
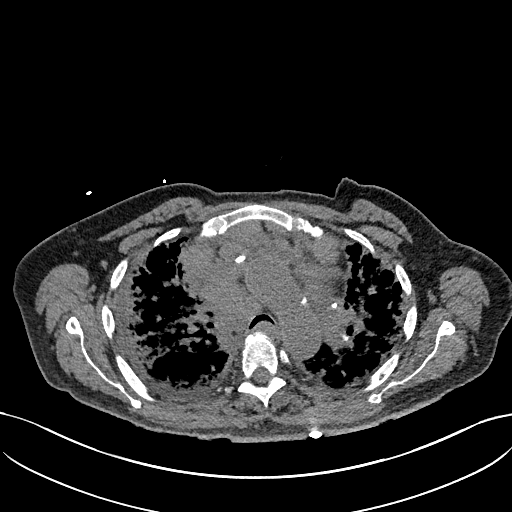
[im 99/148  lung]
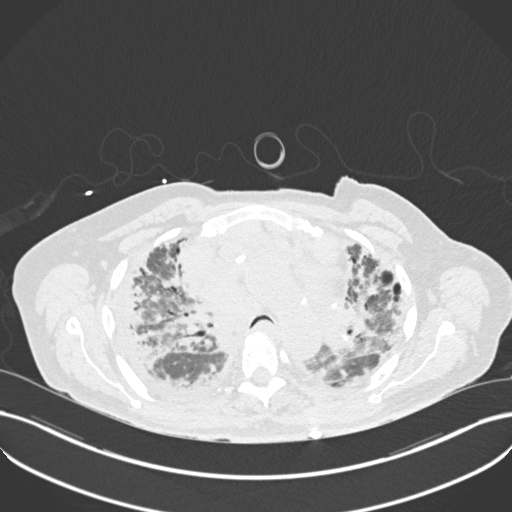
[im 118/148  lung]
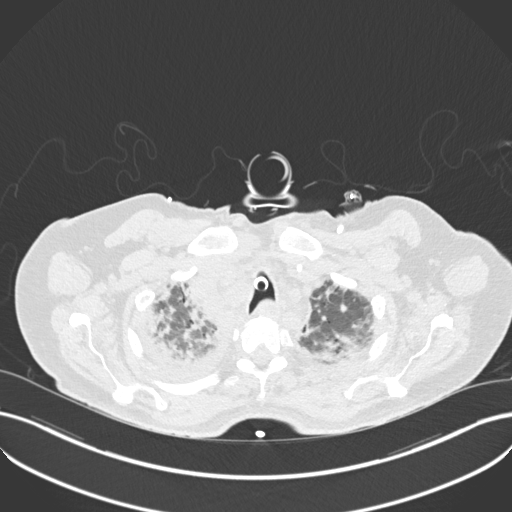
[im 128/148  lung]
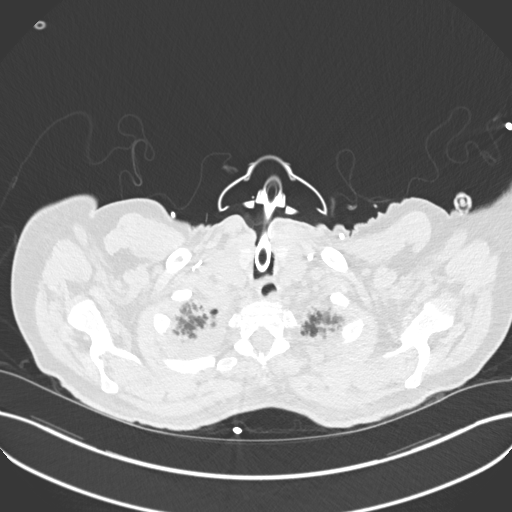
[im 138/148  lung]
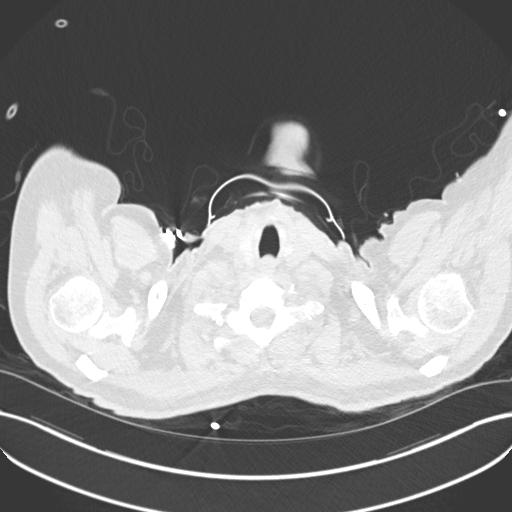

[Series 5: coronal · coronal · 0.60mm/px · 3 of 122 slices shown]
[im 25/122  lung]
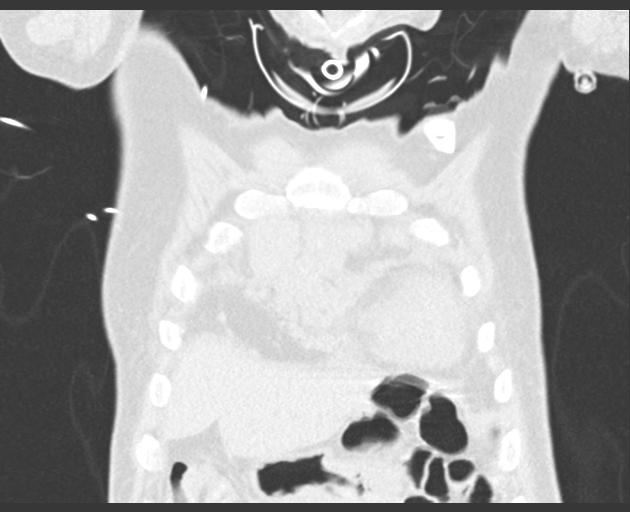
[im 49/122  lung]
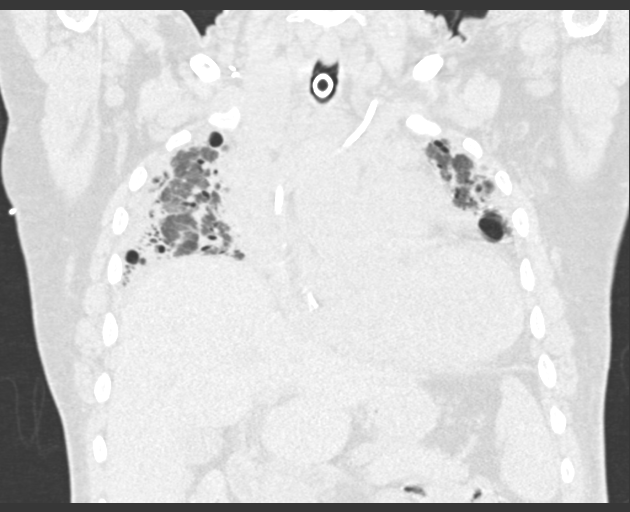
[im 73/122  lung]
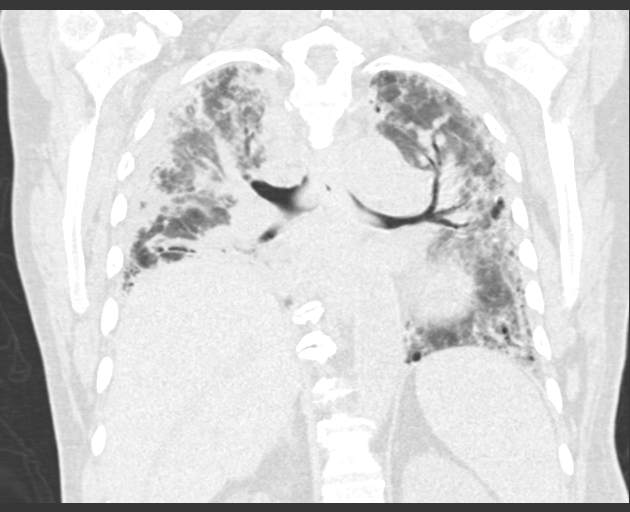

[15 of 36 positions shown; findings below may reference images not displayed]

FINDINGS: Cardiovascular: Left IJ Port-A-Cath terminates in the right atrium.
Atherosclerotic calcification of the aorta. Pulmonic trunk and heart
are enlarged. No pericardial effusion.

Mediastinum/Nodes: Bulky low internal jugular mediastinal and
prepericardiac/juxtadiaphragmatic adenopathy. Index low right
paratracheal lymph node measures 3.3 cm, as on 05/25/2020. Hilar
regions are difficult to evaluate without IV contrast. No axillary
adenopathy. Esophagus is grossly unremarkable.

Lungs/Pleura: Tracheostomy is in place. Image quality is markedly
degraded by expiratory phase imaging. Underlying pattern of severe
pulmonary fibrosis with superimposed peribronchovascular nodularity
and consolidation as well as dependent atelectasis, right greater
than left. Thin rind of pleural fluid in the right hemithorax, new.

Upper Abdomen: Visualized portion of the liver is unremarkable.
Stones in the gallbladder. Adrenal glands are unremarkable. There
may be right caliectasis, incompletely visualized. Visualized
portions of the kidneys, spleen, pancreas, stomach and bowel
otherwise unremarkable.

Musculoskeletal: Degenerative changes in the spine. No worrisome
lytic or sclerotic lesions.
IMPRESSION: 1. Image quality is degraded by expiratory phase imaging. New
peribronchovascular nodularity/consolidation, indicative of an
infectious bronchiolitis/bronchopneumonia, superimposed on severe
pulmonary fibrosis.
2. New thin rind of right pleural fluid.
3. Bulky intrathoracic adenopathy and osseous metastatic disease,
better evaluated on PET 06/19/2020.
4. Cholelithiasis.
5. Difficult to exclude mild caliectasis in the right kidney,
incompletely visualized.
6.  Aortic atherosclerosis ([id number]).
7. Enlarged pulmonic trunk, indicative of pulmonary arterial
hypertension.

## 2023-01-06 IMAGING — DX DG CHEST 1V PORT
1 series · 1 of 1 positions shown · non-contrast
Comparison: Radiograph earlier today.

CLINICAL DATA: Hypoxemia. Recent COVID with ARDS and pulmonary
fibrosis.

EXAM:
PORTABLE CHEST 1 VIEW

[chest ap]
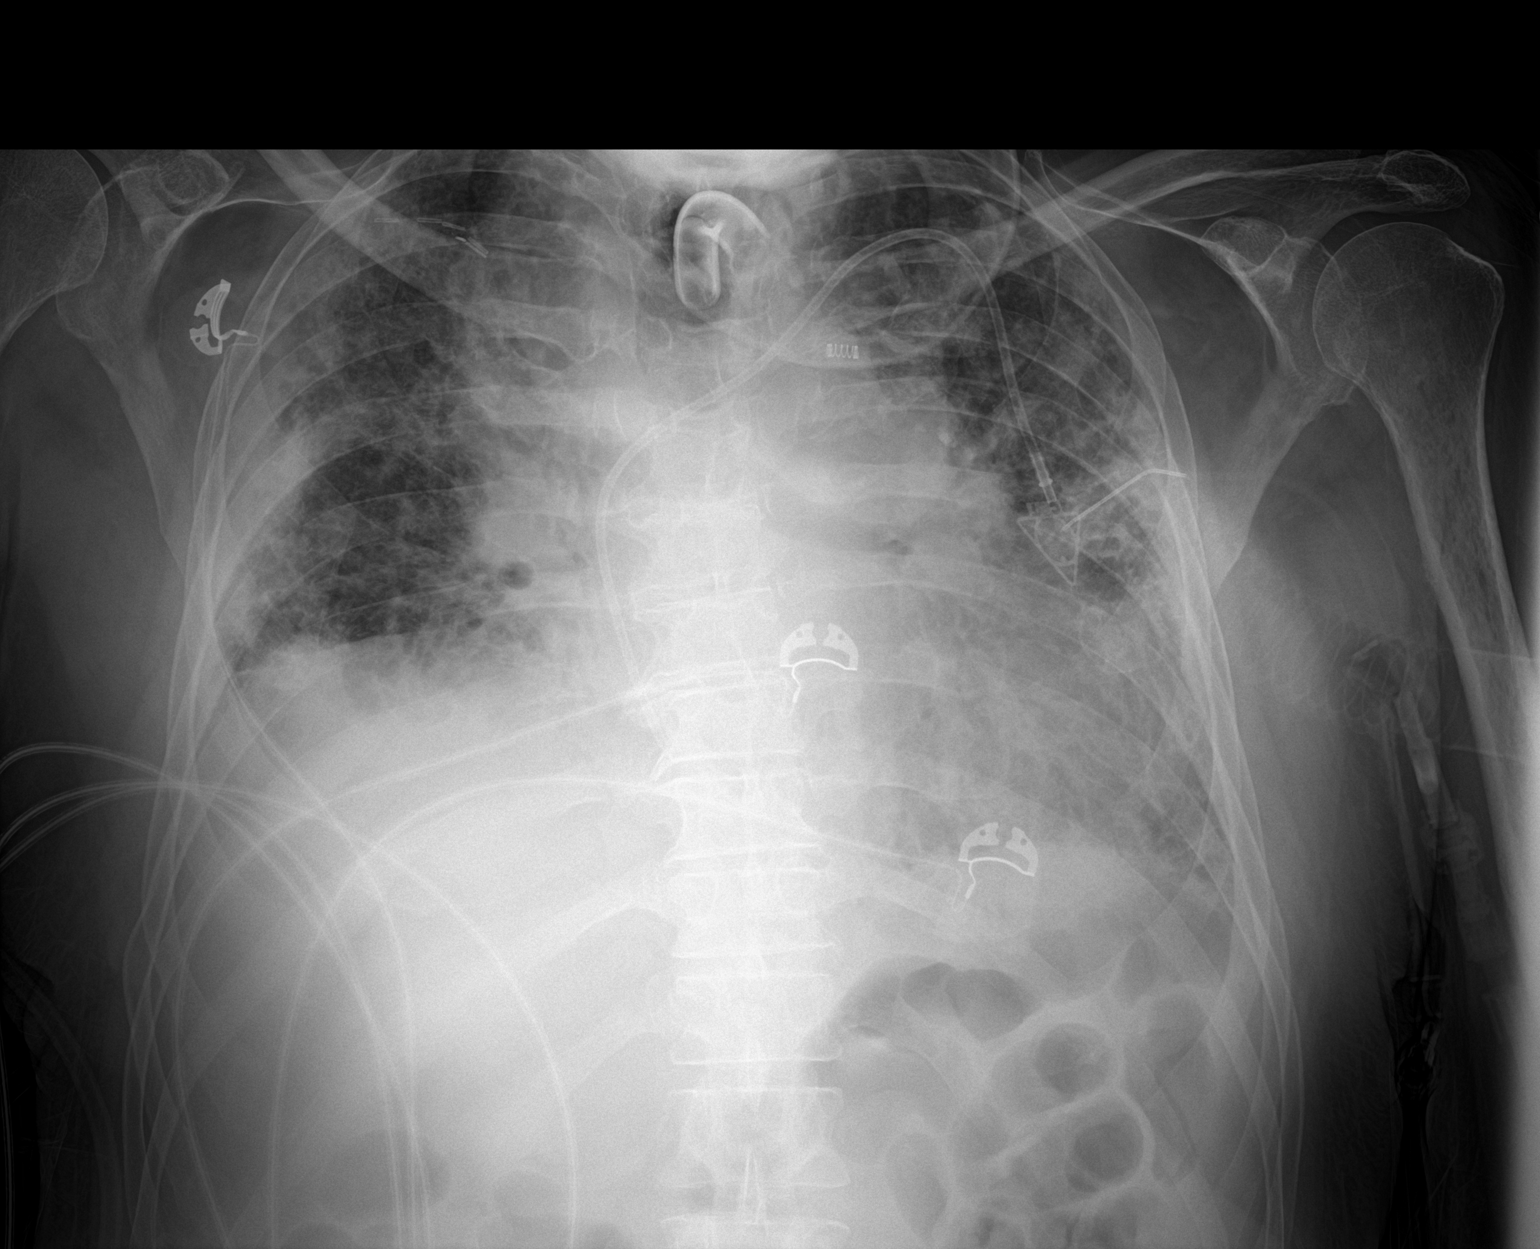

[1 of 1 positions shown; findings below may reference images not displayed]

FINDINGS: Tracheostomy tube tip at the thoracic inlet. Accessed left chest
port in place. Persistent low lung volumes. Stable heart size and
mediastinal contours. Again seen elevation of right hemidiaphragm.
Coarse bilateral lung opacities are not significantly changed from
earlier today. No pneumothorax or large pleural effusion.
IMPRESSION: 1. Persistent low lung volumes. Coarse bilateral lung opacities are
not significantly changed from earlier today, likely representing
infectious or inflammatory process superimposed on pulmonary
fibrosis.
2. Tracheostomy tube tip at the thoracic inlet.

## 2023-01-29 IMAGING — DX DG CHEST 1V PORT
1 series · 1 of 1 positions shown · non-contrast
Comparison: Single-view of the chest 07/21/2020, 05/25/2020 and
04/07/2020.

CLINICAL DATA: Shortness of breath and productive cough for 3-4
days.

EXAM:
PORTABLE CHEST 1 VIEW

[chest ap]
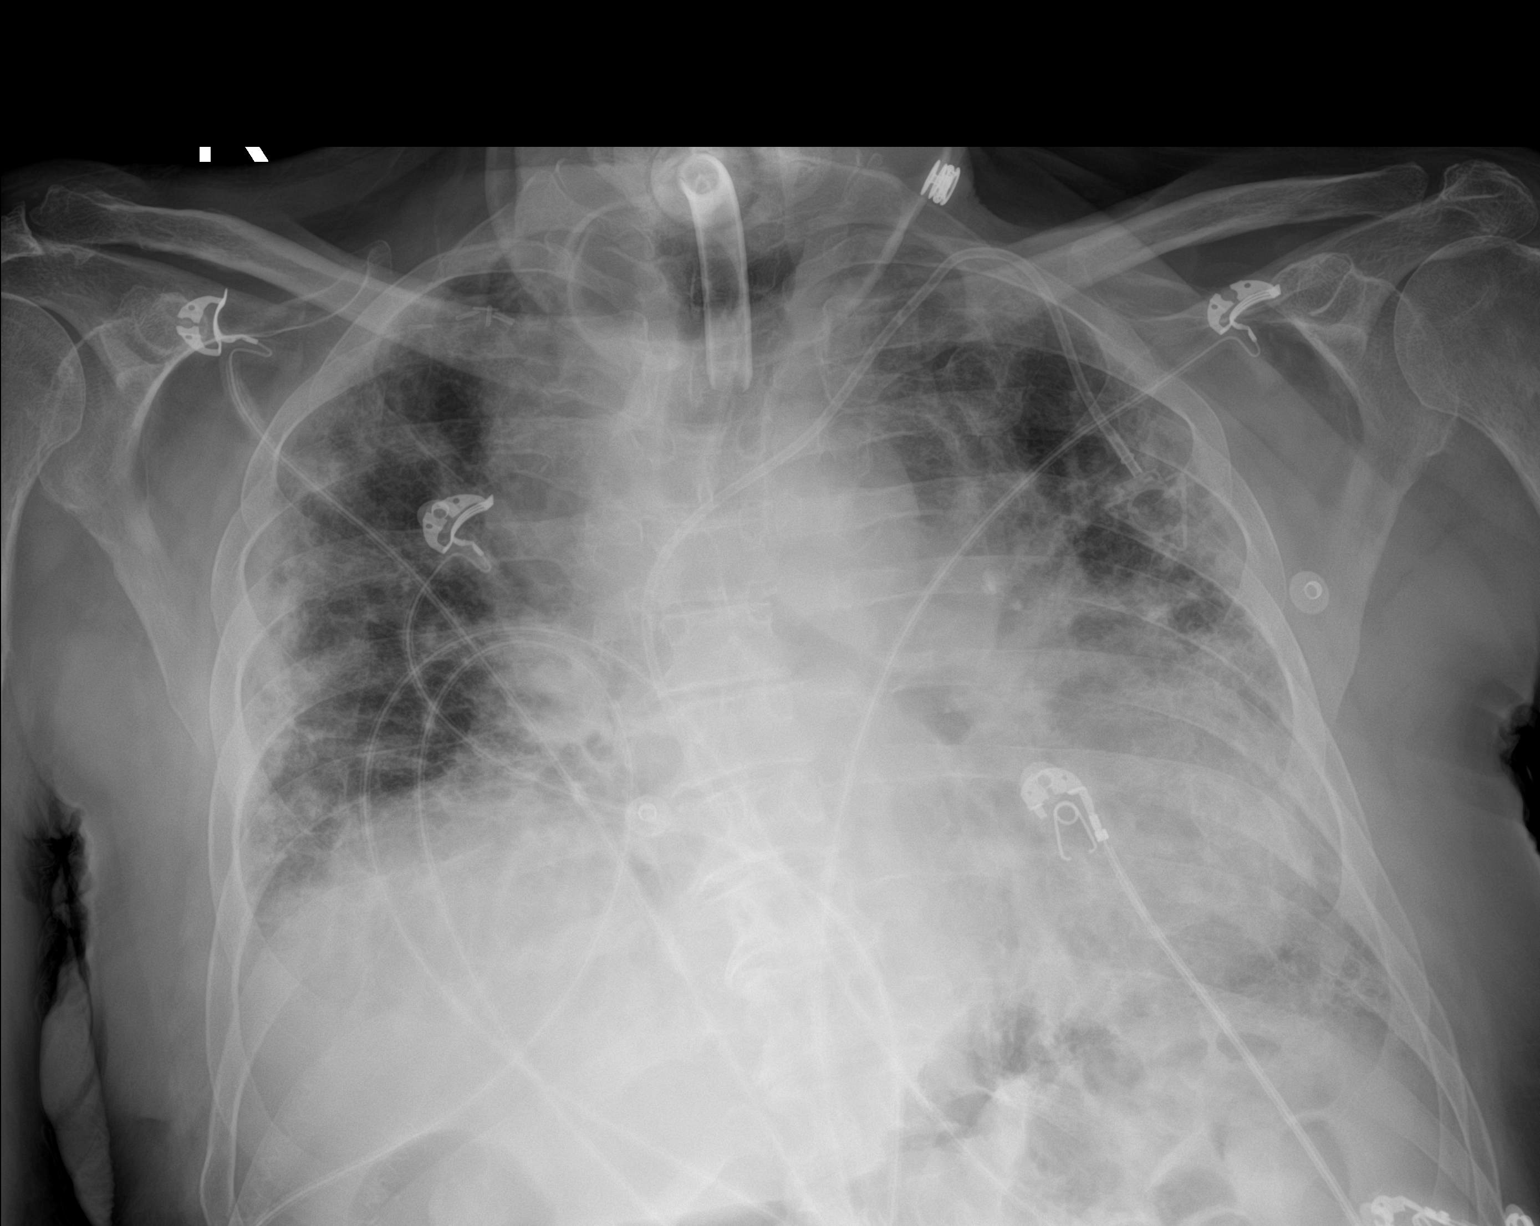

[1 of 1 positions shown; findings below may reference images not displayed]

FINDINGS: Tracheostomy tube and Port-A-Cath are again seen. Coarse pulmonary
opacities in the lung bases and the periphery of the chest
bilaterally again seen. No definite new airspace disease. No
pneumothorax or pleural effusion. Marked cardiomegaly. Bulky
lymphadenopathy is better seen on the prior chest CT.
IMPRESSION: Chronic lung disease without definite acute process.

Cardiomegaly.

Lymphadenopathy is seen on prior CT.

## 2023-01-31 IMAGING — DX DG CHEST 1V PORT SAME DAY
1 series · 1 of 1 positions shown · non-contrast
Comparison: 08/13/2020

CLINICAL DATA: Shortness of breath

EXAM:
PORTABLE CHEST 1 VIEW

[chest ap]
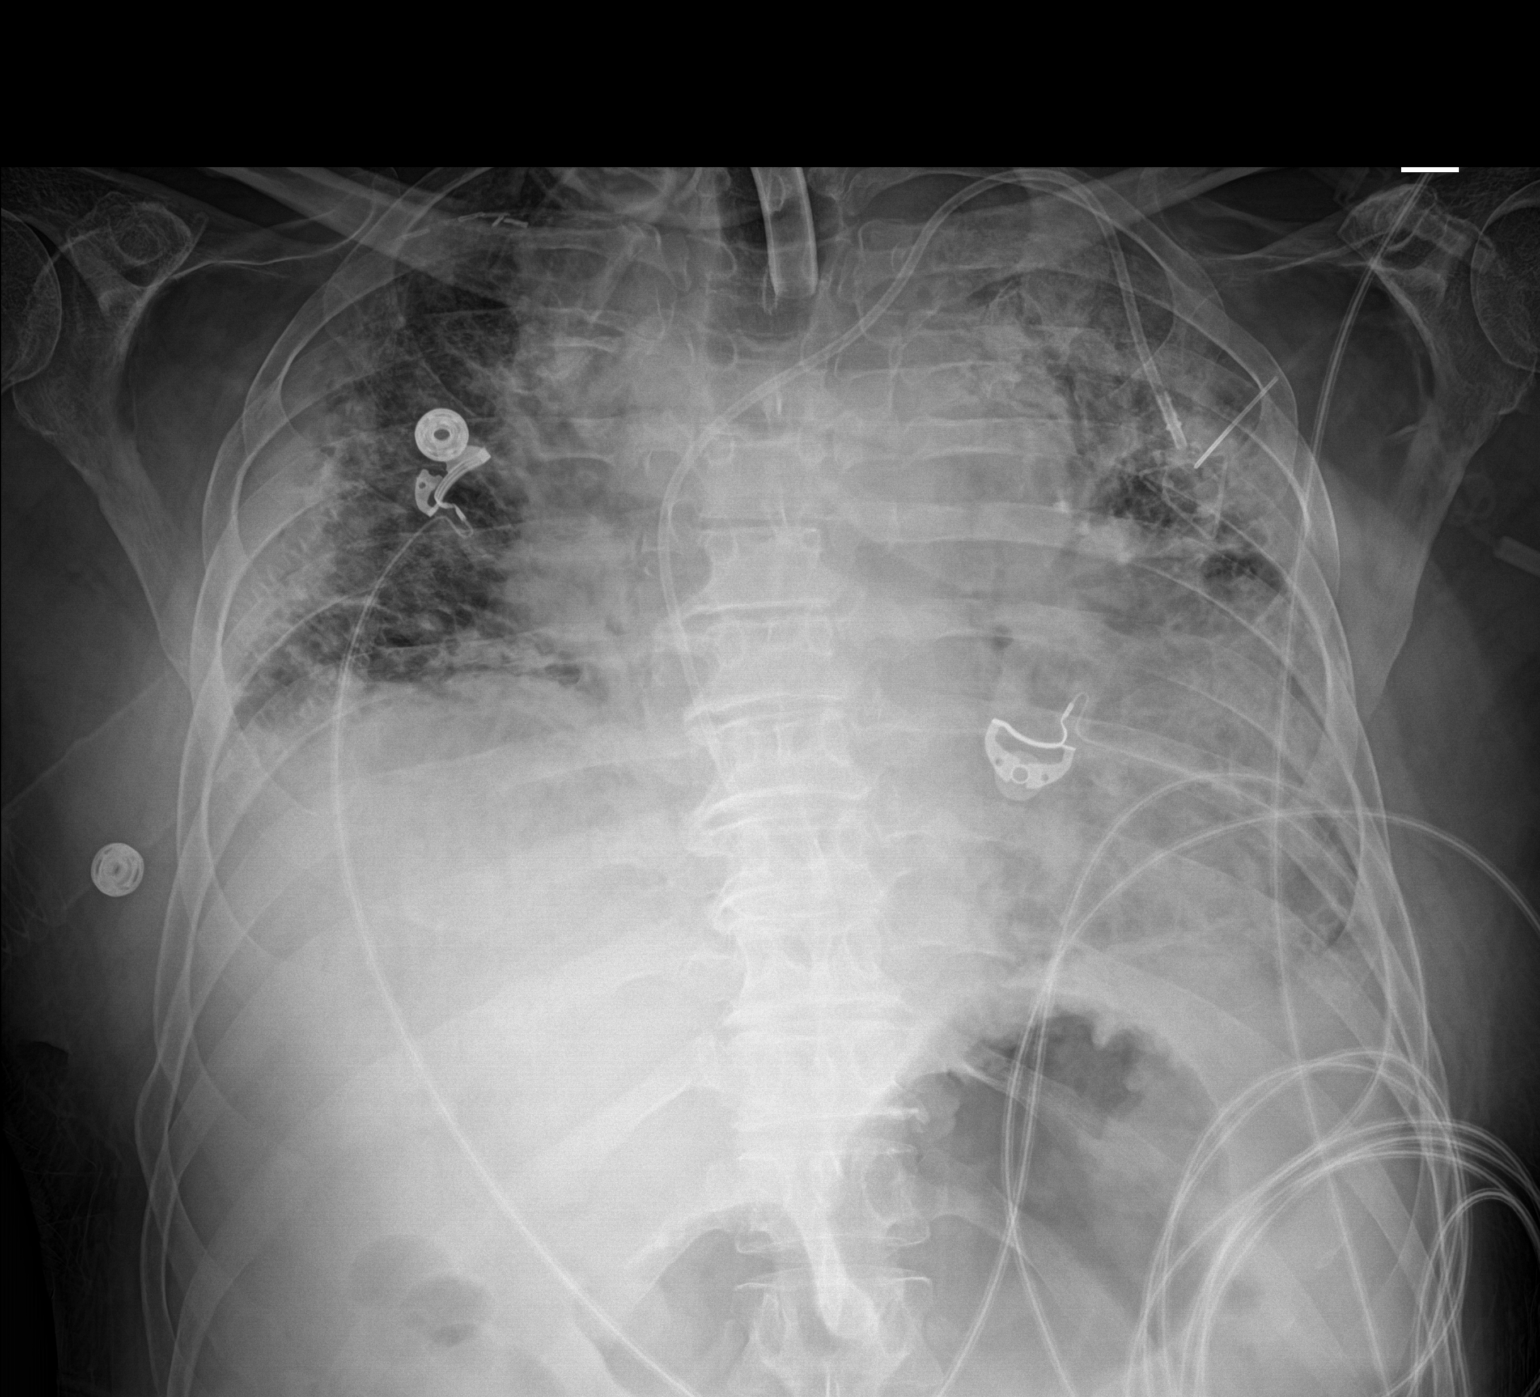

[1 of 1 positions shown; findings below may reference images not displayed]

FINDINGS: Cardiac shadow is stable. Left chest wall port and tracheostomy tube
are again seen and stable. Chronic fibrotic changes are noted within
both lungs. Elevation of the right hemidiaphragm is noted.
Increasing opacity left apex is seen consistent with acute on
chronic infiltrate. No other focal abnormality is noted.
IMPRESSION: Increasing airspace opacity in the left apex consistent with acute
on chronic infiltrate.

Tubes and lines as described.

## 2023-03-06 IMAGING — DX DG CHEST 2V
2 series · 2 of 2 positions shown · non-contrast
Comparison: August 15, 2020.

CLINICAL DATA: Acute on chronic respiratory failure.

EXAM:
CHEST - 2 VIEW

[chest lat]
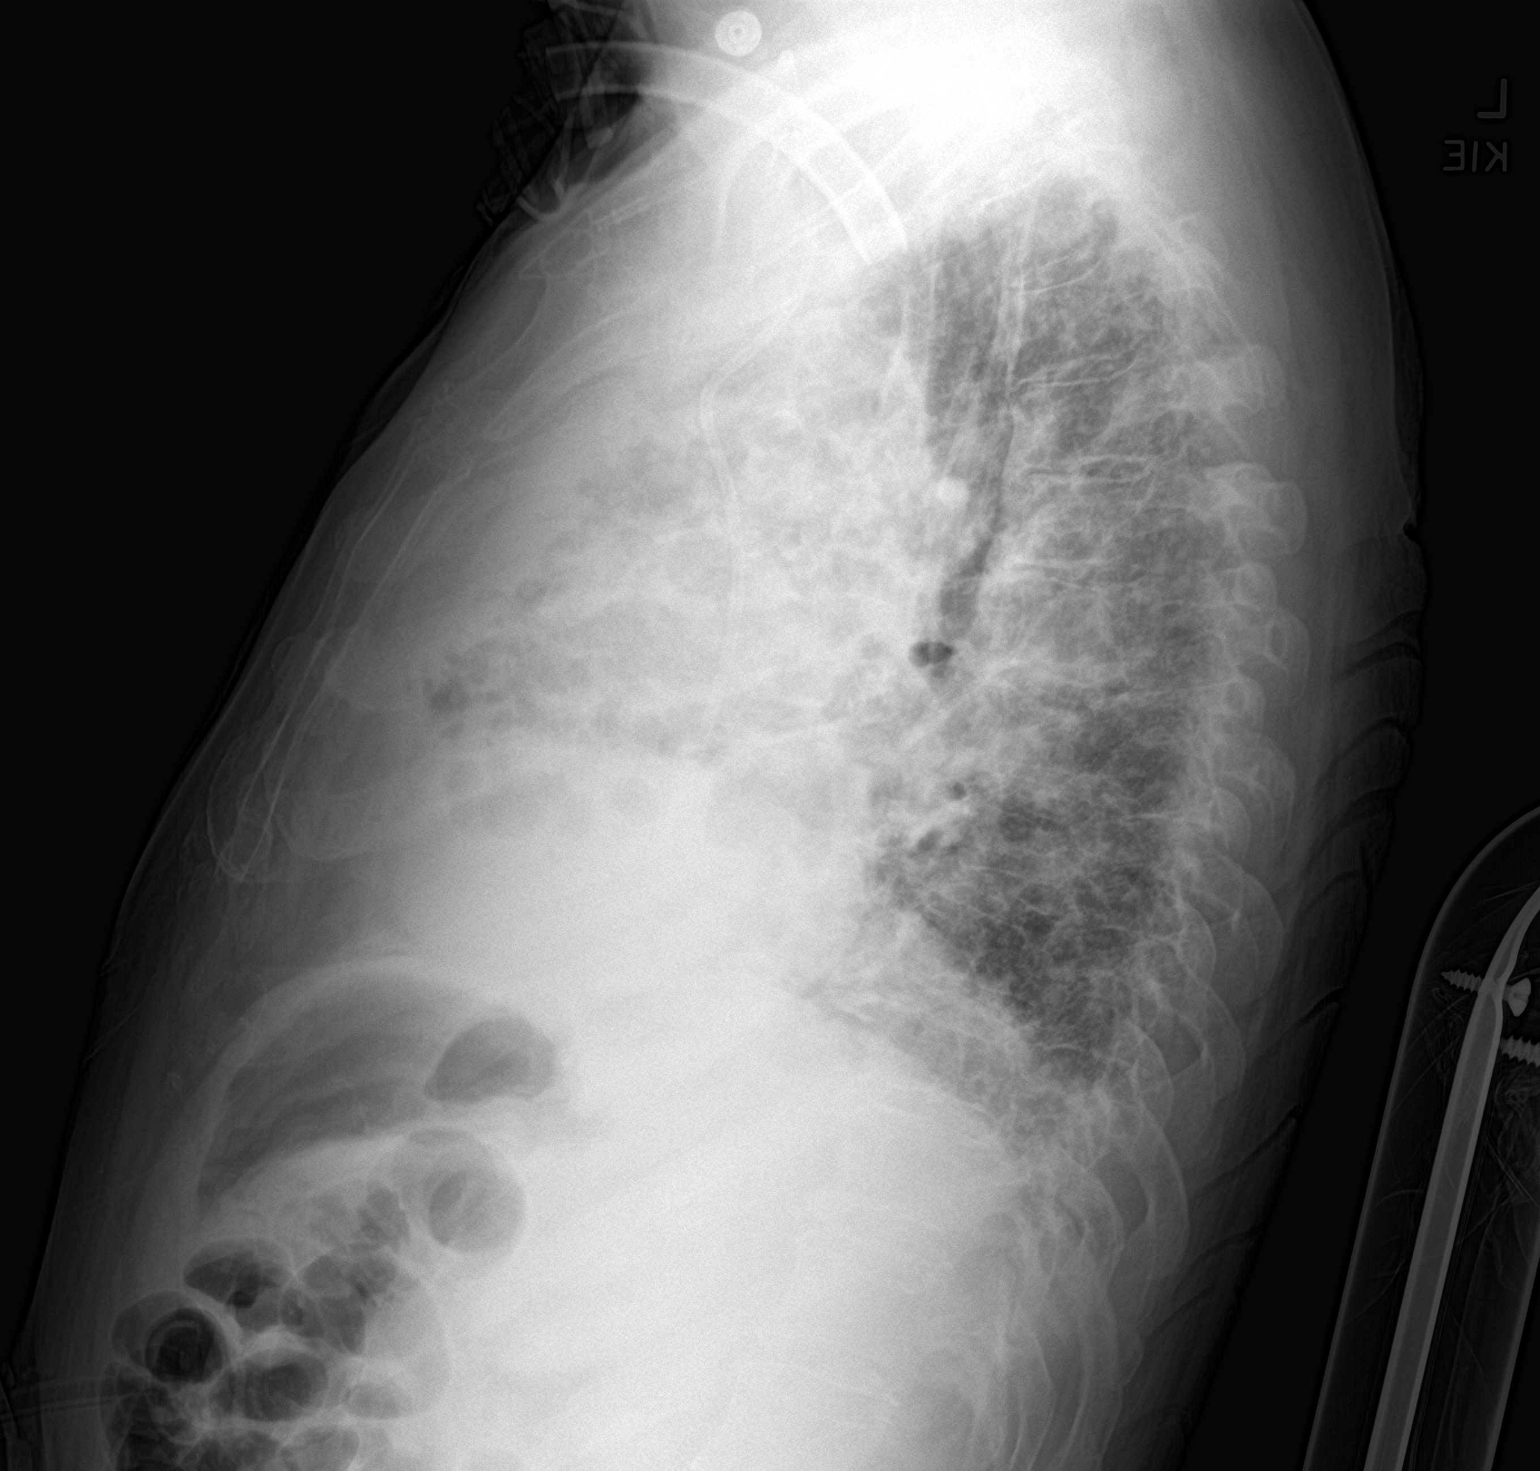

[chest ap strecther]
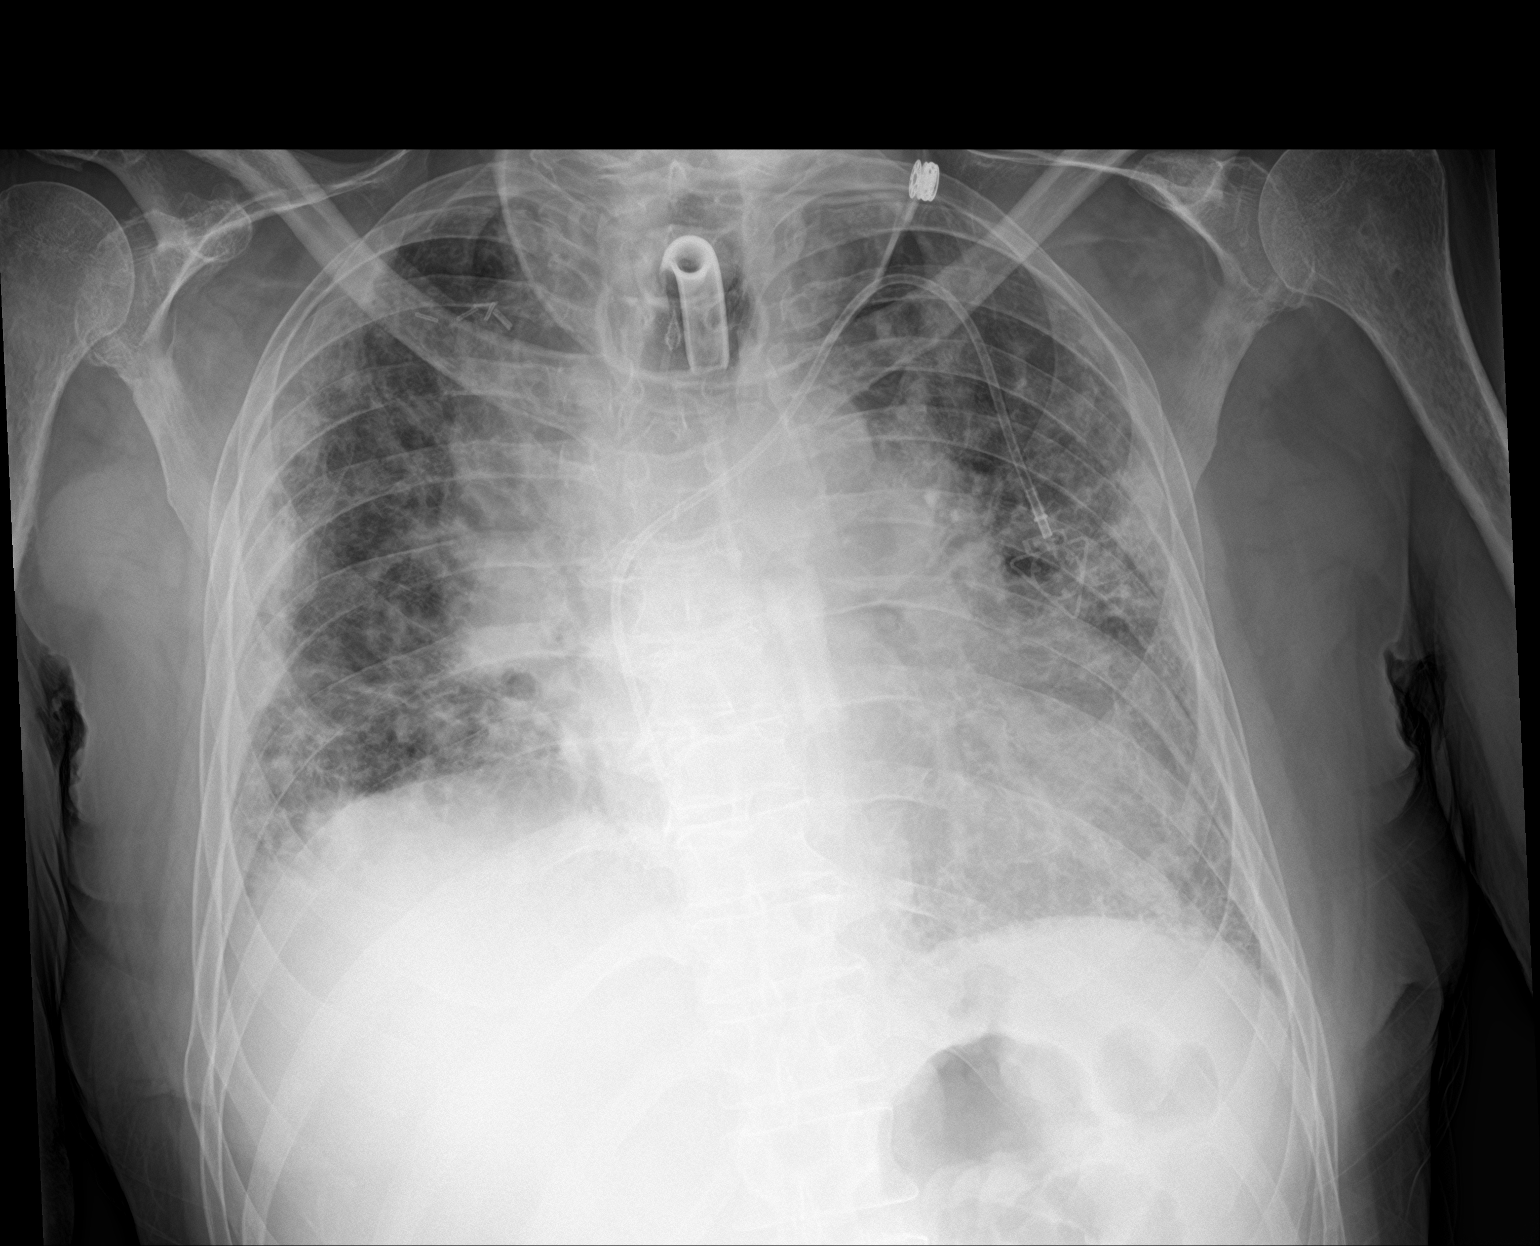

[2 of 2 positions shown; findings below may reference images not displayed]

FINDINGS: Stable cardiomegaly. Tracheostomy tube is in good position.
Left-sided Port-A-Cath is unchanged. Stable bilateral lung opacities
are noted concerning for multifocal pneumonia or chronic scarring.
No pneumothorax or pleural effusion is noted. Bony thorax is
unremarkable.
IMPRESSION: Stable bilateral lung opacities as described above.

## 2023-03-07 IMAGING — US US ABDOMEN LIMITED
1 series · 14 of 25 positions shown · non-contrast
Comparison: None.

CLINICAL DATA: Transaminitis.

EXAM:
ULTRASOUND ABDOMEN LIMITED RIGHT UPPER QUADRANT

[Series 1: us abdomen limited · 14 of 41 slices shown]
[im 1/41]
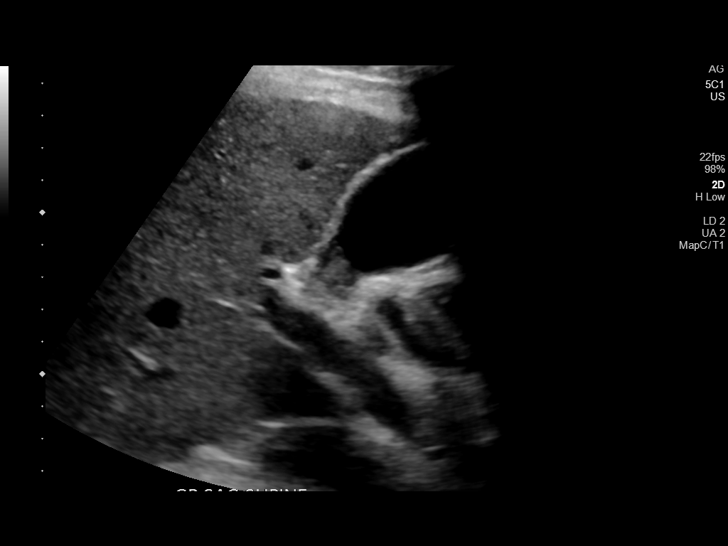
[im 4/41]
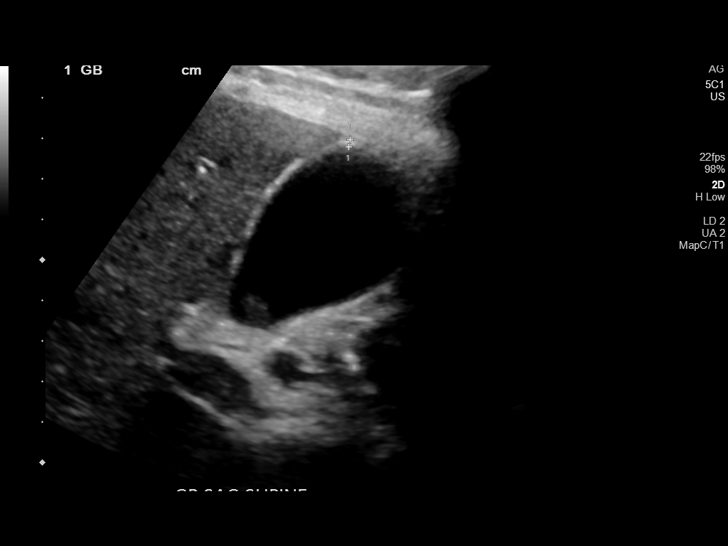
[im 7/41]
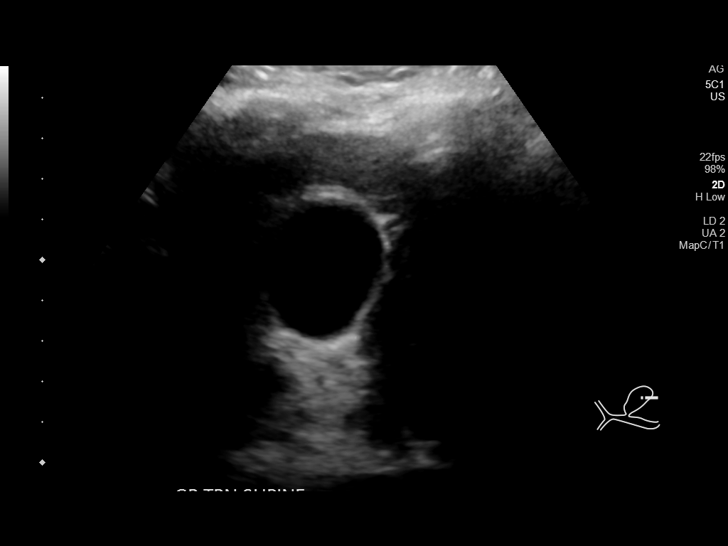
[im 11/41]
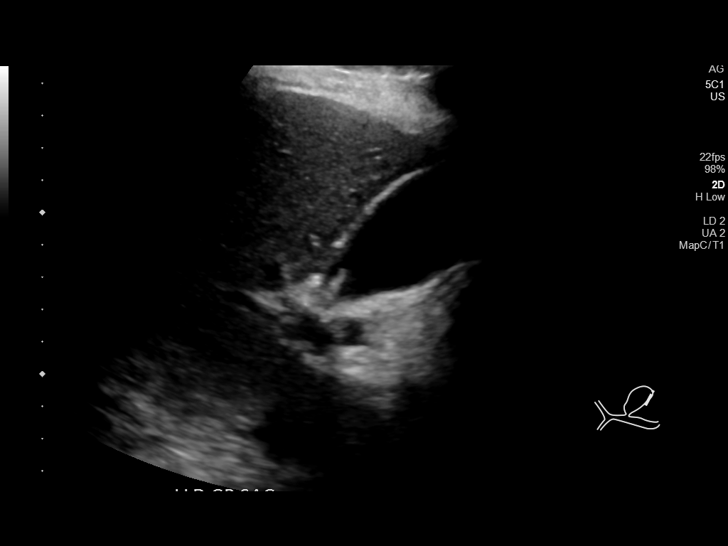
[im 14/41]
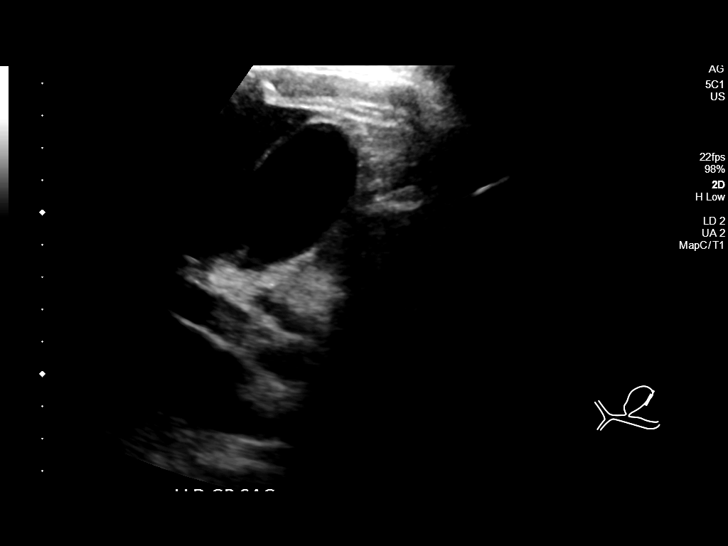
[im 16/41]
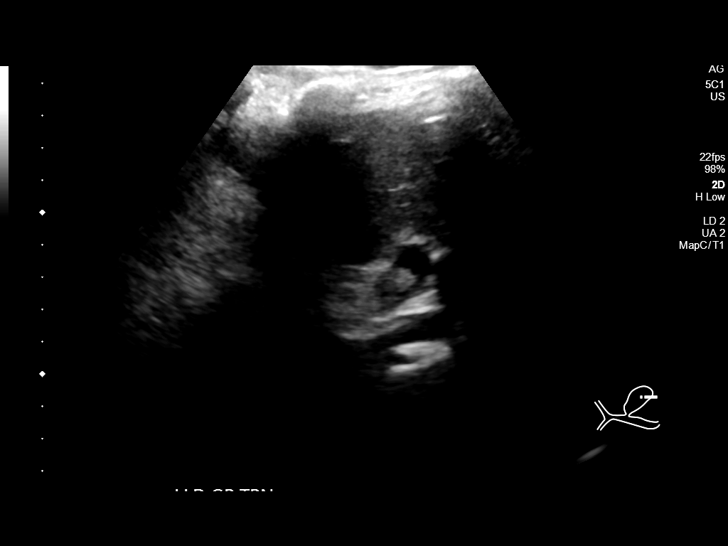
[im 19/41]
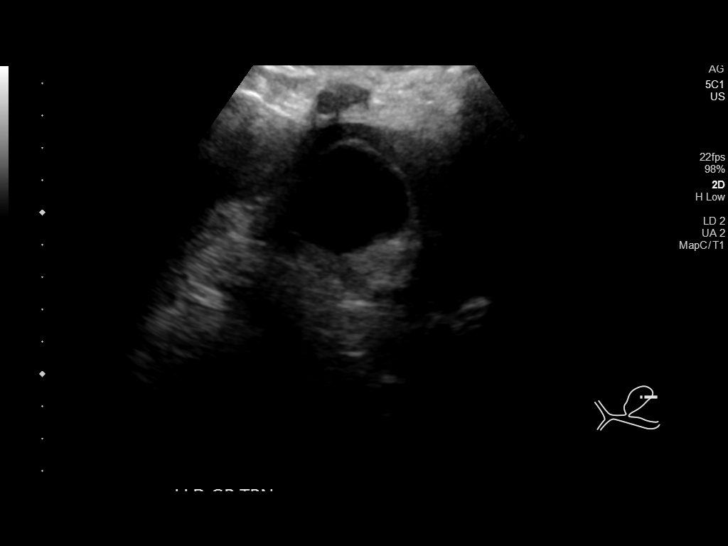
[im 22/41]
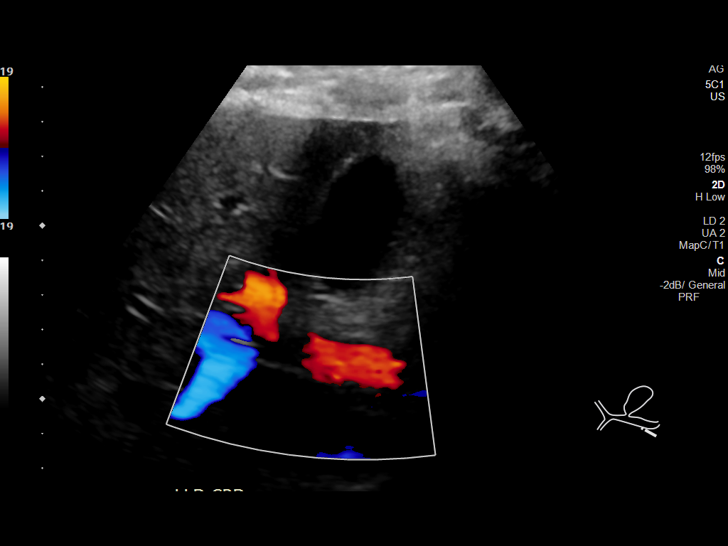
[im 26/41]
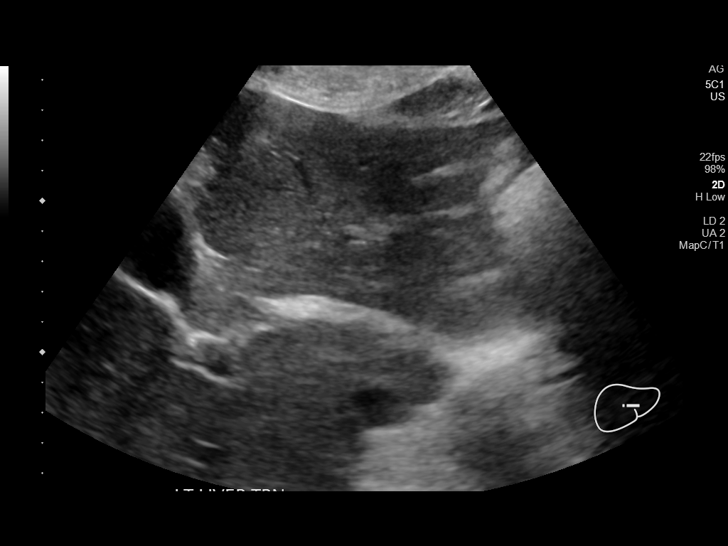
[im 27/41]
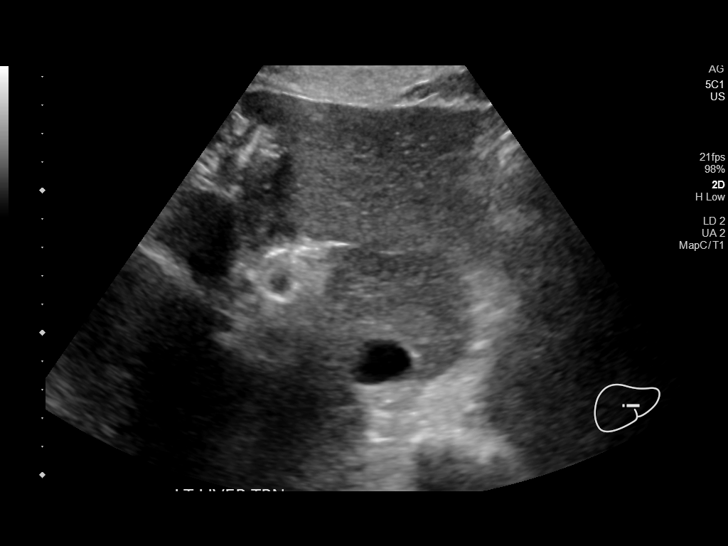
[im 31/41]
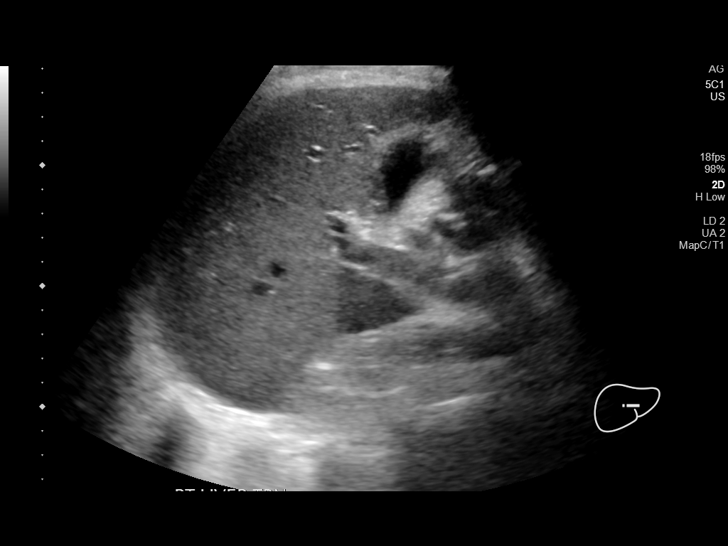
[im 34/41]
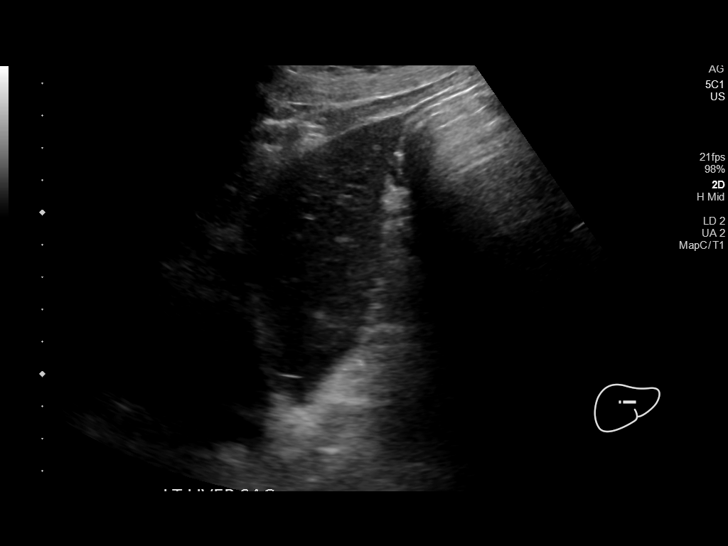
[im 37/41]
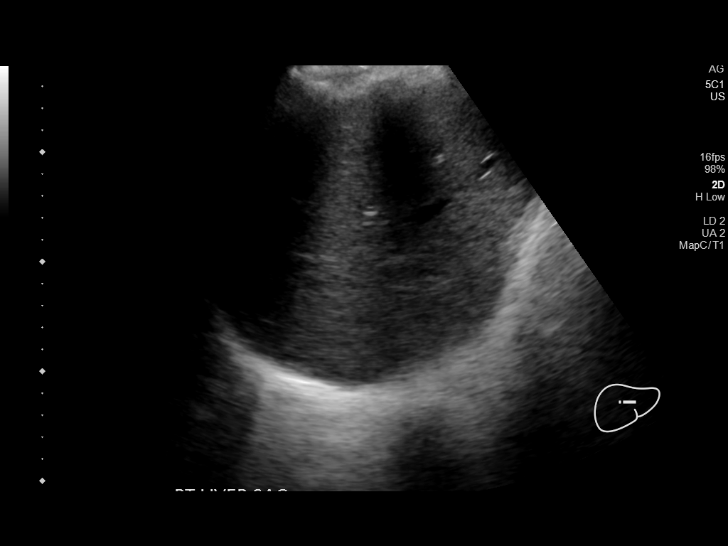
[im 41/41]
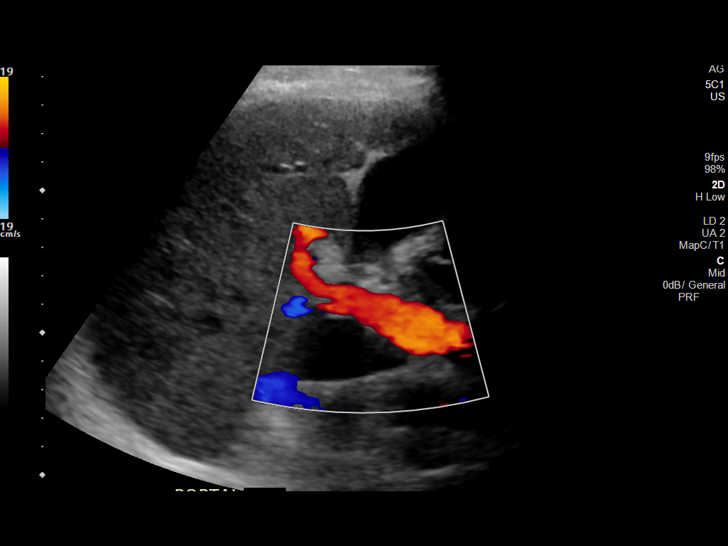

[14 of 25 positions shown; findings below may reference images not displayed]

FINDINGS: Gallbladder:

No gallbladder wall thickening. Small amount of biliary sludge. No
sonographic Murphy sign noted by sonographer.

Common bile duct:

Diameter: 3 mm, normal

Liver:

No focal lesion identified. Within normal limits in parenchymal
echogenicity. Portal vein is patent on color Doppler imaging with
normal direction of blood flow towards the liver.

Other: None.
IMPRESSION: Biliary sludge without evidence of acute cholecystitis.

## 2023-03-07 IMAGING — DX DG CHEST 1V PORT
1 series · 1 of 1 positions shown · non-contrast
Comparison: Chest x-ray dated September 18, 2020.

CLINICAL DATA: Shortness of breath.

EXAM:
PORTABLE CHEST 1 VIEW

[chest ap]
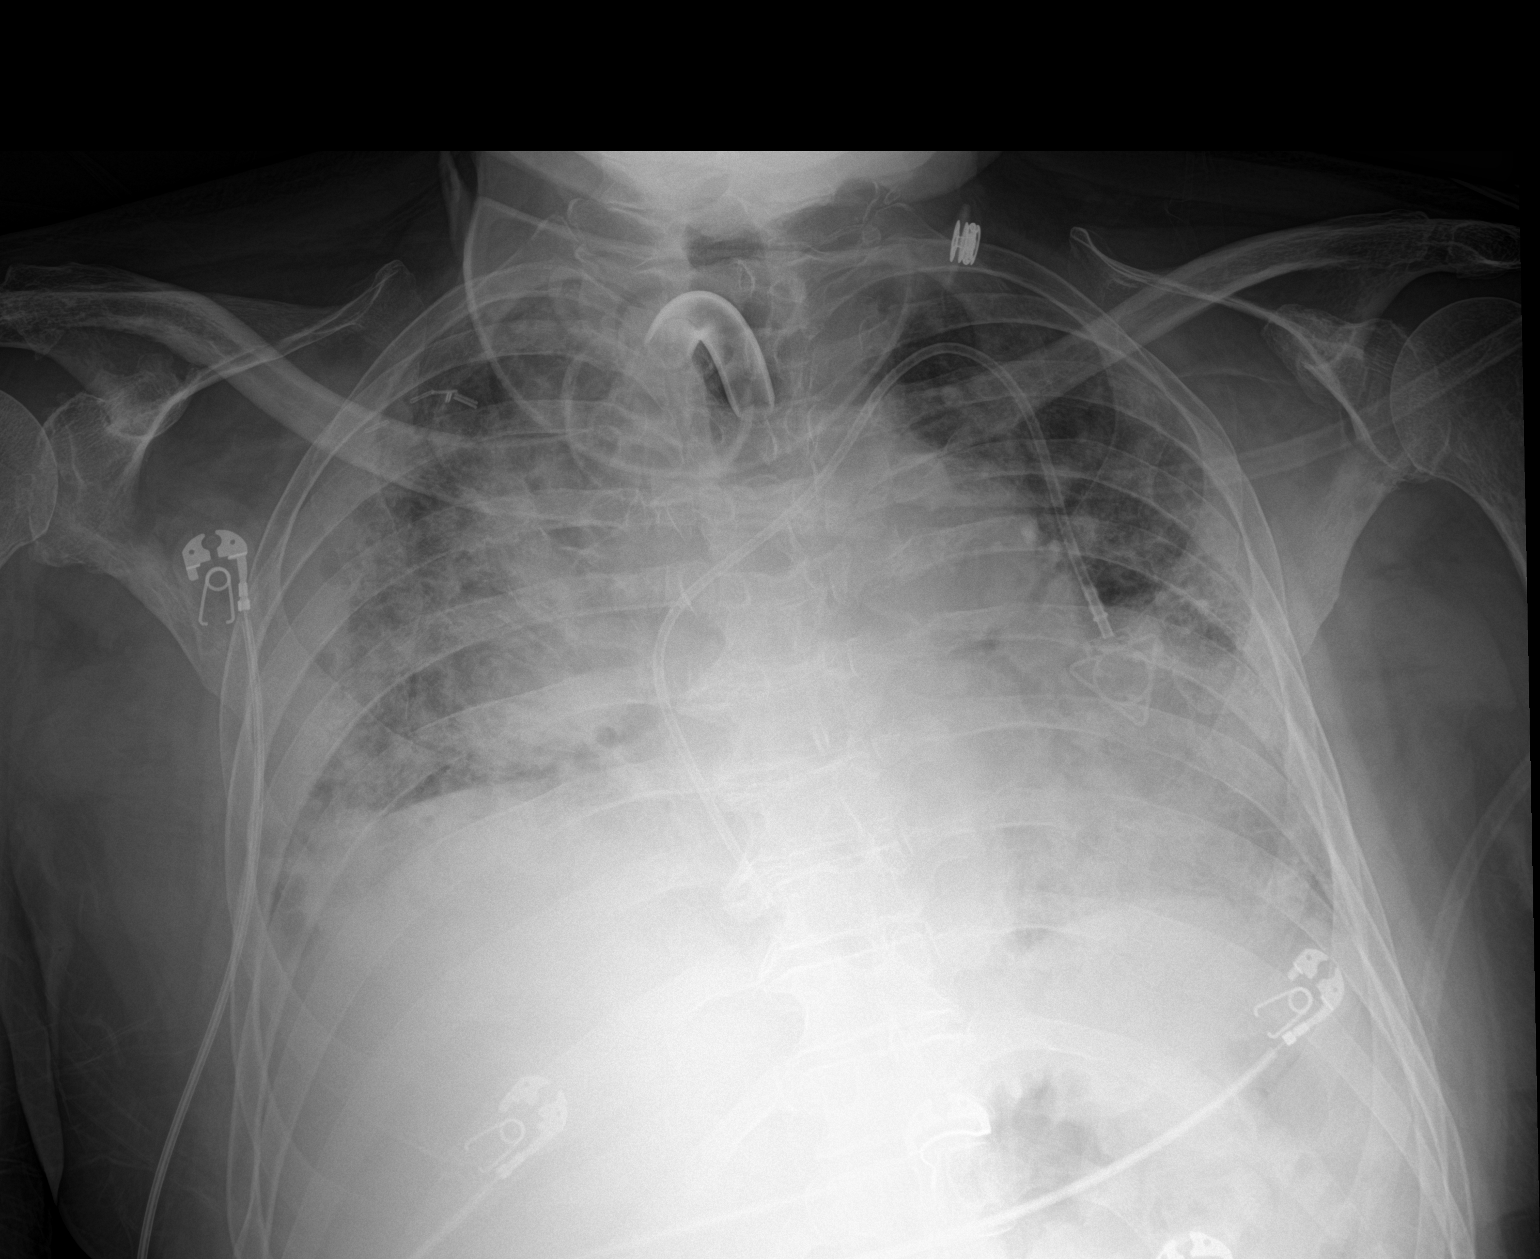

[1 of 1 positions shown; findings below may reference images not displayed]

FINDINGS: Unchanged tracheostomy tube and left chest wall port catheter.
Stable cardiomegaly and mediastinal widening related to underlying
lymphadenopathy. Unchanged diffuse patchy nodular opacities
throughout both lungs superimposed on chronic pulmonary fibrosis.
Unchanged small right pleural effusion. No pneumothorax. No acute
osseous abnormality.
IMPRESSION: 1. Unchanged multifocal pneumonia superimposed on chronic severe
pulmonary fibrosis.
2. Unchanged mediastinal lymphadenopathy.

## 2023-03-08 IMAGING — CT CT CHEST W/O CM
2 of 4 series · 15 of 36 positions shown, 18 images · non-contrast
Comparison: July 21, 2020

CLINICAL DATA: Respiratory failure.  History of Hodgkin's lymphoma.

EXAM:
CT CHEST WITHOUT CONTRAST
TECHNIQUE: Multidetector CT imaging of the chest was performed following the
standard protocol without IV contrast.

[Series 2: thorax · axial · 0.69mm/px · z∈[+1455,+1689]mm · 12 of 139 slices shown, 15 images]
[im 11/139  mediastinal]
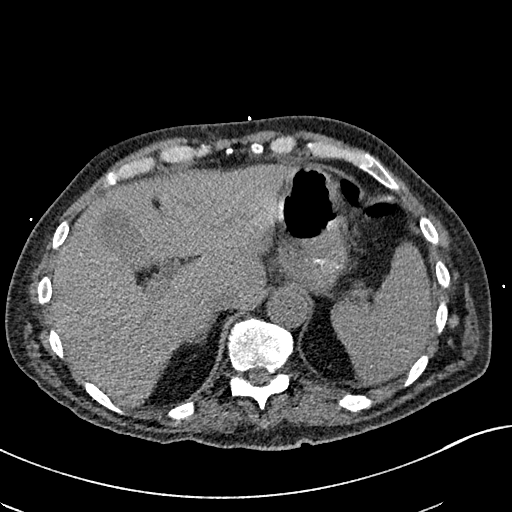
[im 11/139  lung]
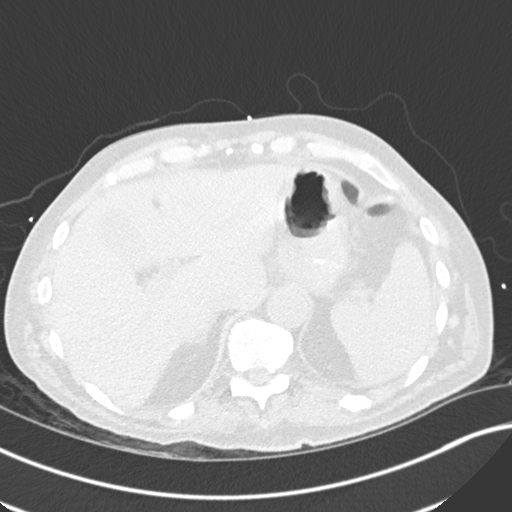
[im 22/139  lung]
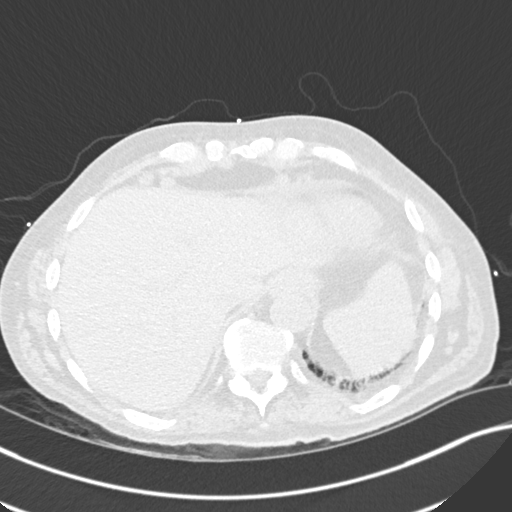
[im 32/139  lung]
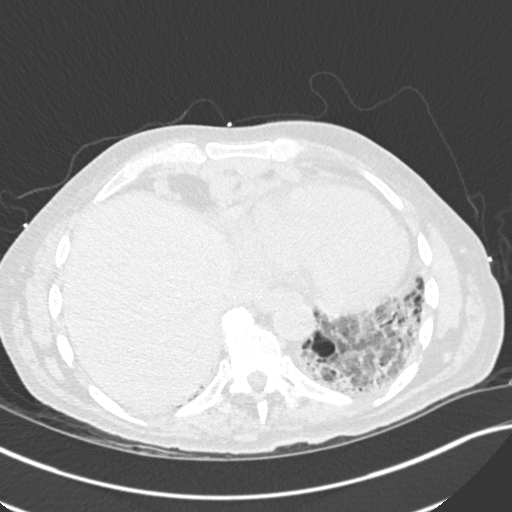
[im 43/139  lung]
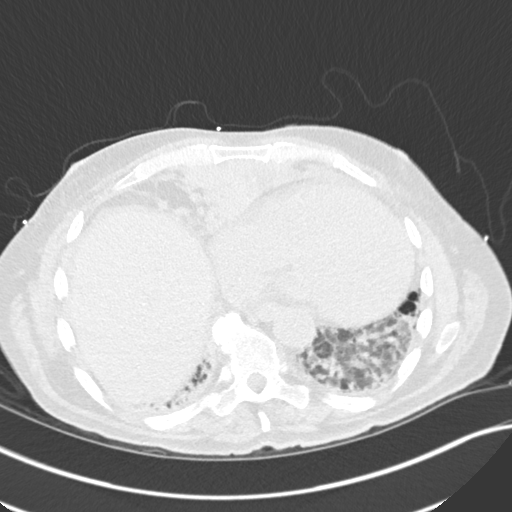
[im 54/139  mediastinal]
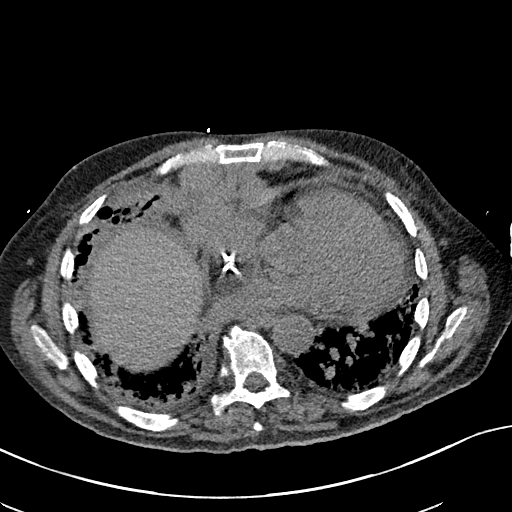
[im 54/139  lung]
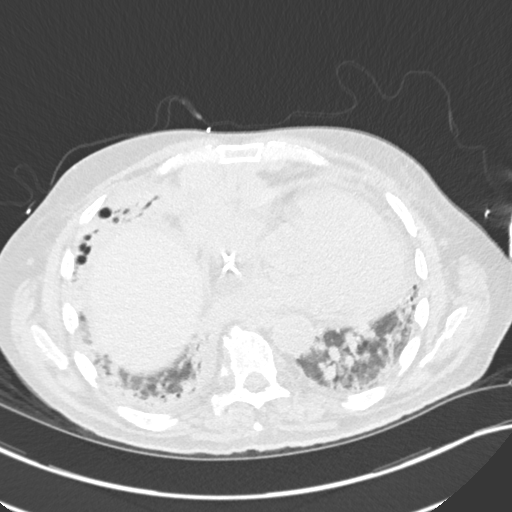
[im 64/139  lung]
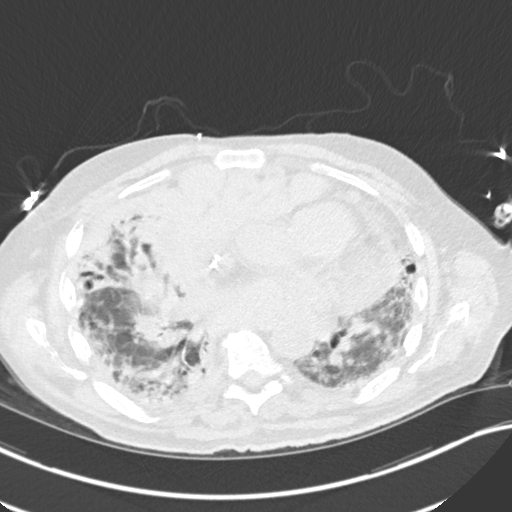
[im 75/139  lung]
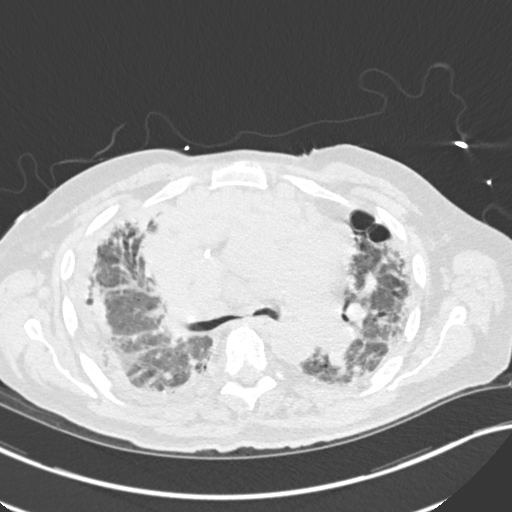
[im 85/139  lung]
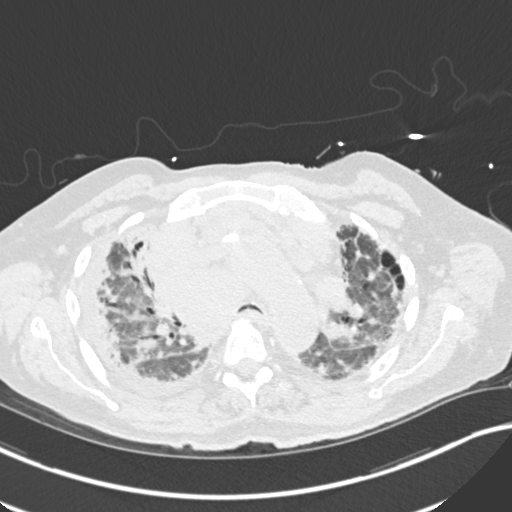
[im 96/139  mediastinal]
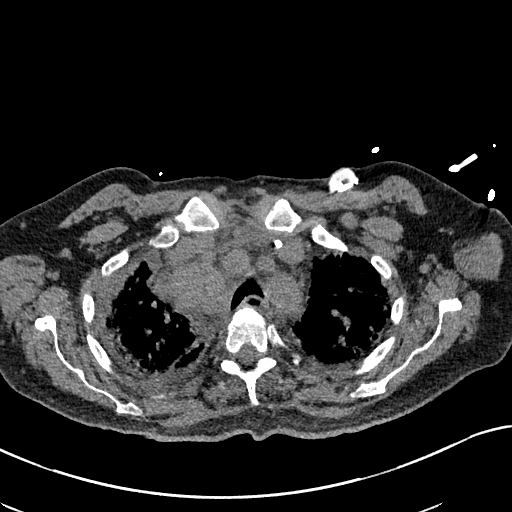
[im 96/139  lung]
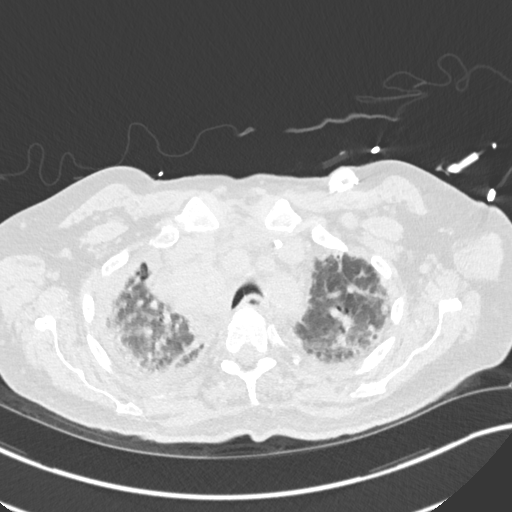
[im 107/139  lung]
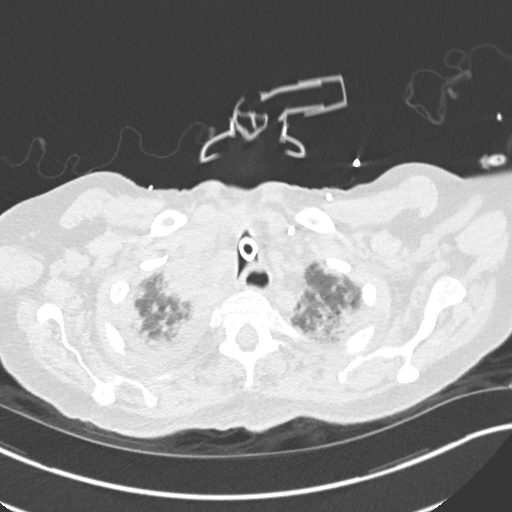
[im 117/139  lung]
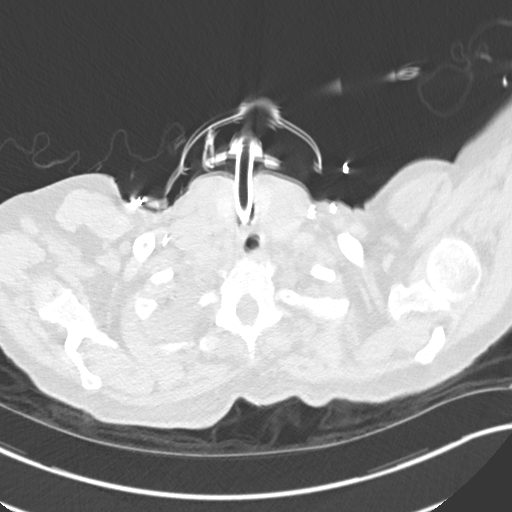
[im 128/139  lung]
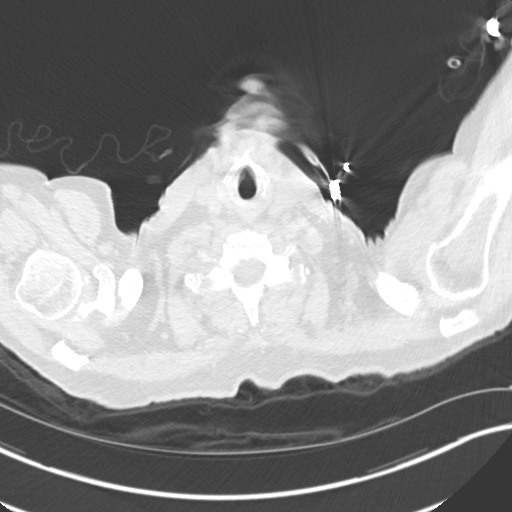

[Series 6: coronal · coronal · 0.56mm/px · 3 of 112 slices shown]
[im 23/112  lung]
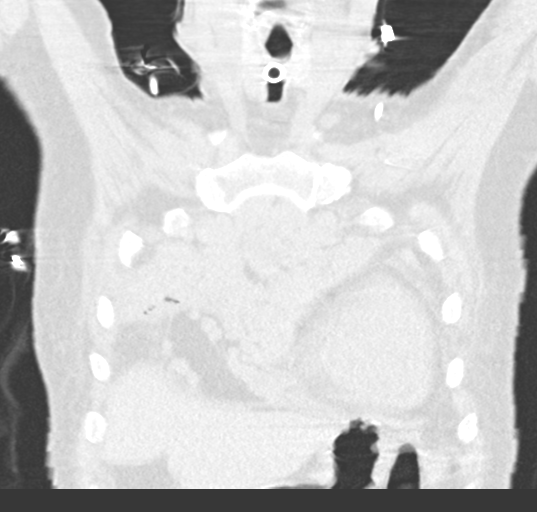
[im 45/112  lung]
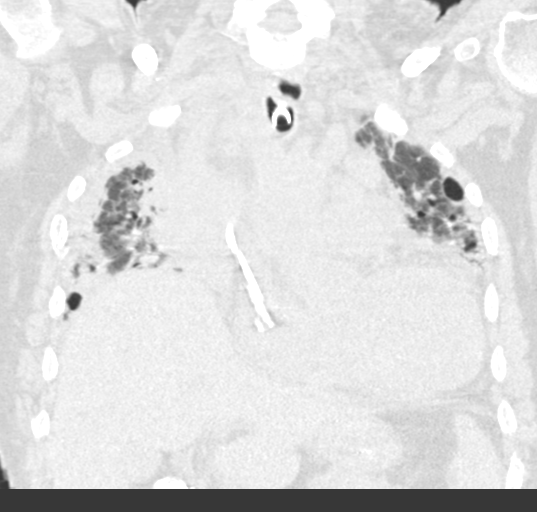
[im 67/112  lung]
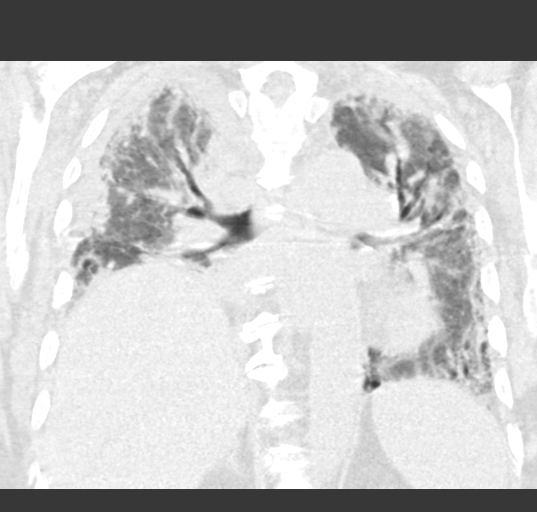

[15 of 36 positions shown; findings below may reference images not displayed]

FINDINGS: Cardiovascular: Heart is mildly enlarged. LEFT chest port tip
terminates at the superior cavoatrial junction. No pericardial
effusion. Minimal atherosclerotic calcifications. Enlarged pulmonary
artery as can be seen in pulmonary arterial hypertension.

Mediastinum/Nodes: Revisualization of extensive adenopathy. LEFT
level 3 axillary lymph node measures 11 mm, previously 3 mm (series
2, image 42). LEFT supraclavicular lymph node measures 15 mm,
previously 15 mm (series 2, image 24). RIGHT level 1 axillary lymph
node measures 8 mm, previously 4 mm (series 2, image 63). RIGHT
internal mammary lymph node measures 31 mm, previously 26 mm (series
2, image 77). RIGHT paratracheal conglomeration spanned 59 x 43 mm,
previously 44 by 30 mm (series 2, image 45). There is a lunate
configuration of the trachea.

Lungs/Pleura: Visualization of a peripheral rind of fluid adjacent
to the RIGHT lung, similar in comparison to prior. Extensive
bilateral ground-glass opacities with subpleural reticulation and
suggestion of honeycombing. Peribronchovascular nodularity is
similar in comparison to prior. Similar appearance of RIGHT middle
lobe atelectasis.

Upper Abdomen: No acute abnormality.

Musculoskeletal: Known osseous metastatic disease is not well
visualized. Limited evaluation of the ribs secondary to respiratory
motion. No acute thoracic spine fracture.
IMPRESSION: 1. Interval increase in bulky adenopathy within the mediastinum
consistent with known lymphoma. Increased axillary lymph node size
in comparison to prior.
2. Revisualization of sequela of advanced pulmonary fibrosis.
Unchanged appearance of peribronchovascular nodularity. This could
reflect lymphangitic spread of lymphoma versus underlying infection.
3. Unchanged small rind of RIGHT-sided pleural fluid. Underlying
pleural nodularity from lymphoma remains in the differential.
4. Lunate configuration of the trachea could reflect underlying
tracheobronchial malacia.
5. Enlarged pulmonary artery as can be seen in pulmonary arterial
hypertension.

## 2023-03-12 IMAGING — DX DG CHEST 1V PORT
1 series · 1 of 1 positions shown · non-contrast
Comparison: 09/19/2020

CLINICAL DATA: Respiratory distress

EXAM:
PORTABLE CHEST 1 VIEW

[chest ap]
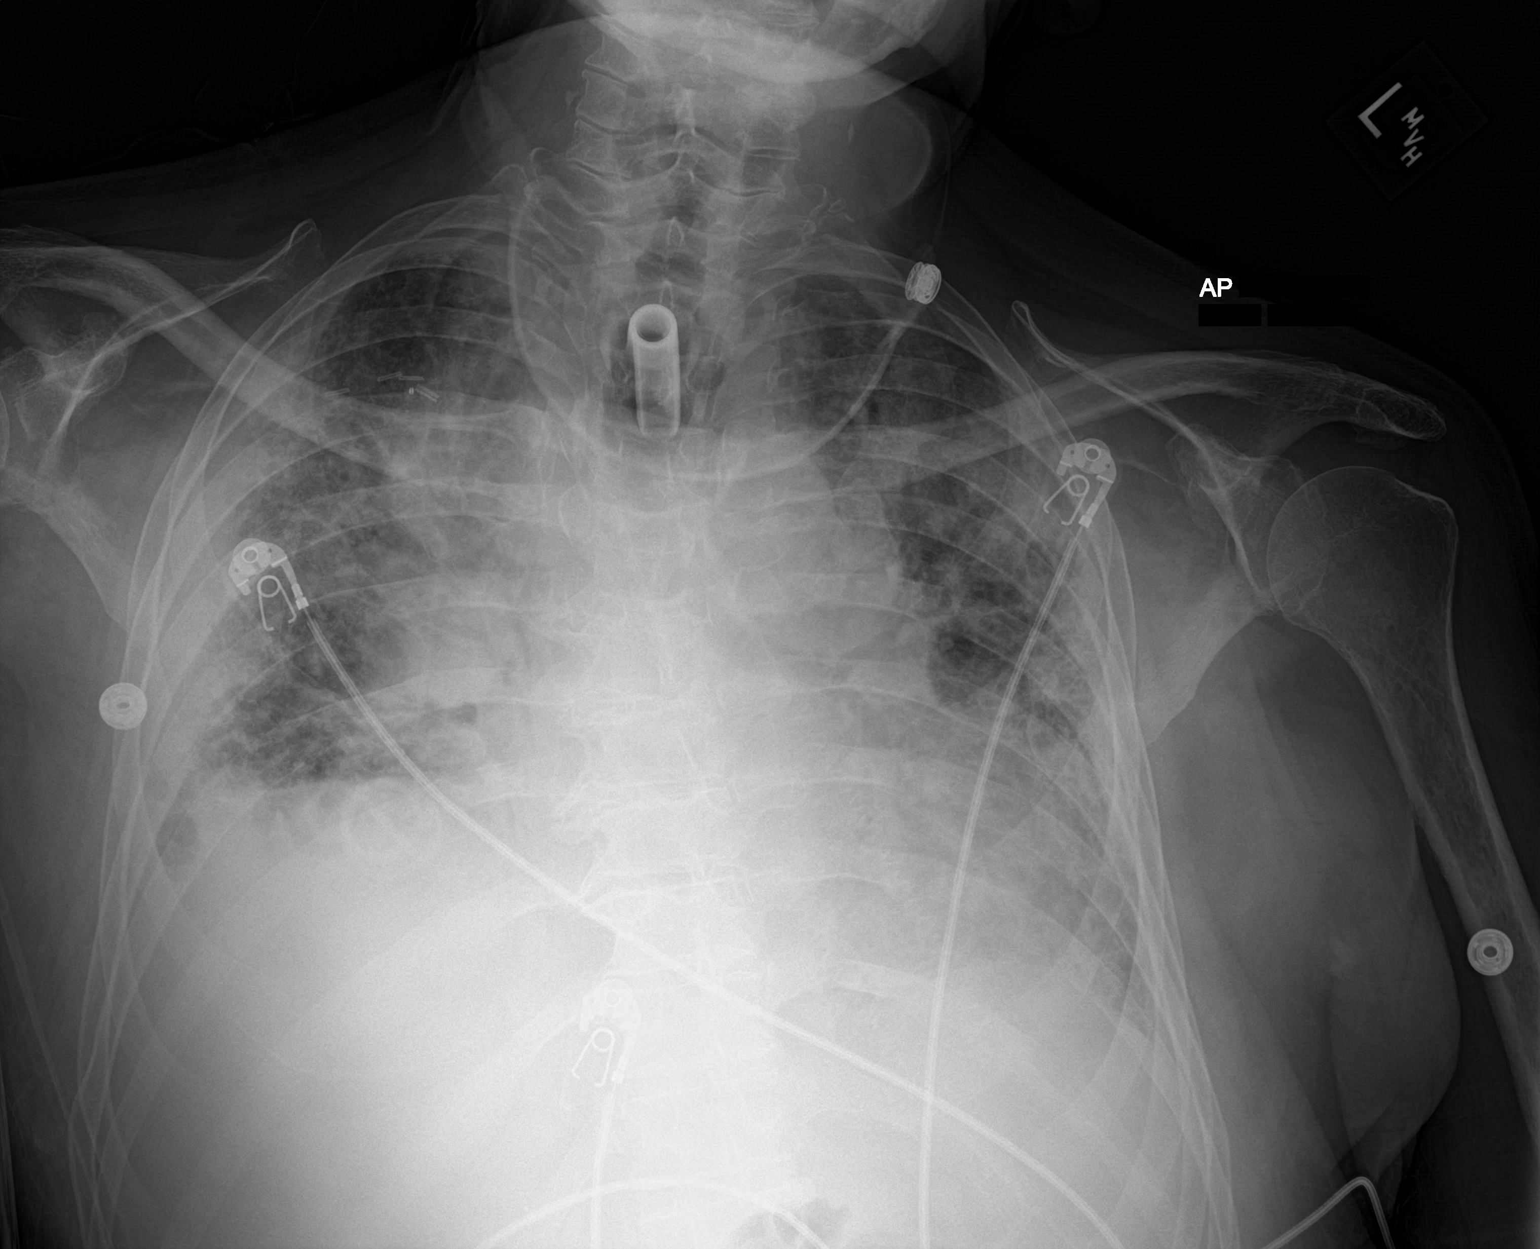

[1 of 1 positions shown; findings below may reference images not displayed]

FINDINGS: Lung volumes are small with elevation of the right hemidiaphragm,
stable since prior examination. Superimposed multifocal pulmonary
infiltrates likely related to chronic underlying interstitial lung
disease appears stable, better assessed on CT examination of
09/20/2020. Superimposed right pleural thickening is again noted.
Tracheostomy unchanged. Cardiac size is enlarged with associated
superior mediastinal widening again noted related to extensive
mediastinal and pericardial adenopathy better demonstrated on prior
CT examination. No acute bone abnormality.
IMPRESSION: Stable diffuse pulmonary infiltrates likely related to chronic
underlying interstitial lung disease and asymmetric right pleural
thickening.

Stable enlargement of the cardiomediastinal silhouette related to
extensive mediastinal and pericardial adenopathy better appreciated
on prior CT examination.

## 2023-03-13 IMAGING — CT CT ANGIO CHEST
2 of 7 series · 17 of 46 positions shown · IV contrast (OMNIPAQUE)
Comparison: 09/20/2020

CLINICAL DATA: Cough and hypoxia.  [id number].

EXAM:
CT ANGIOGRAPHY CHEST WITH CONTRAST
TECHNIQUE: Multidetector CT imaging of the chest was performed using the
standard protocol during bolus administration of intravenous
contrast. Multiplanar CT image reconstructions and MIPs were
obtained to evaluate the vascular anatomy.
CONTRAST:  80mL OMNIPAQUE IOHEXOL 350 MG/ML SOLN

[Series 6: thins · axial · 0.69mm/px · z∈[+1435,+1653]mm · 15 of 250 slices shown]
[im 16/250  lung]
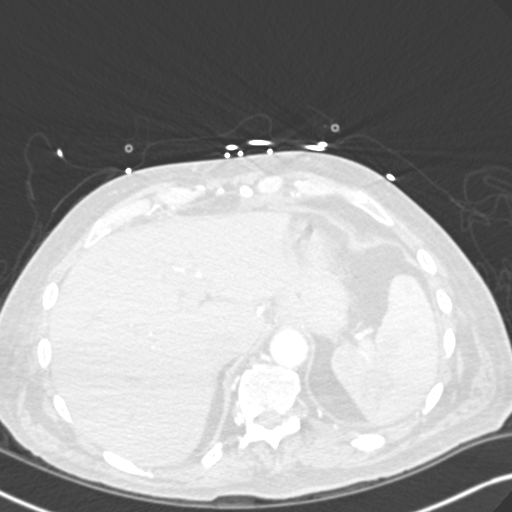
[im 32/250  soft-tissue]
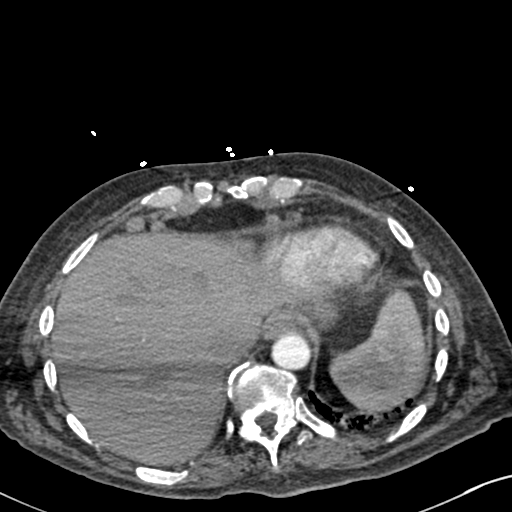
[im 47/250  lung]
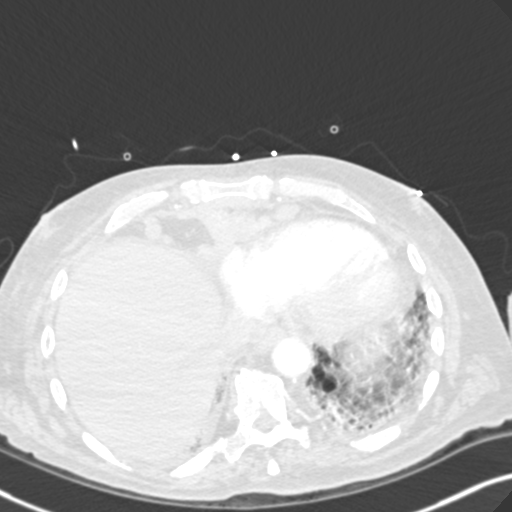
[im 63/250  soft-tissue]
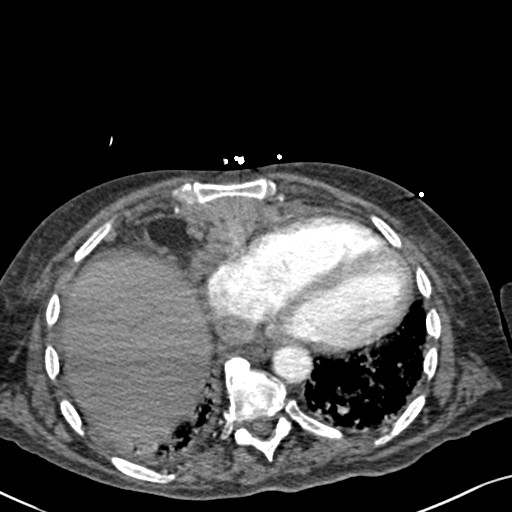
[im 78/250  lung]
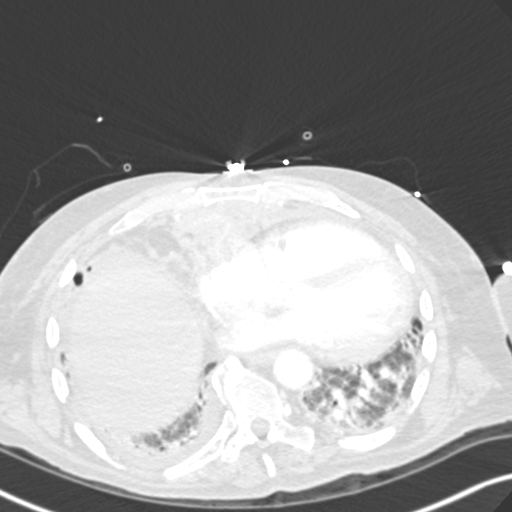
[im 94/250  soft-tissue]
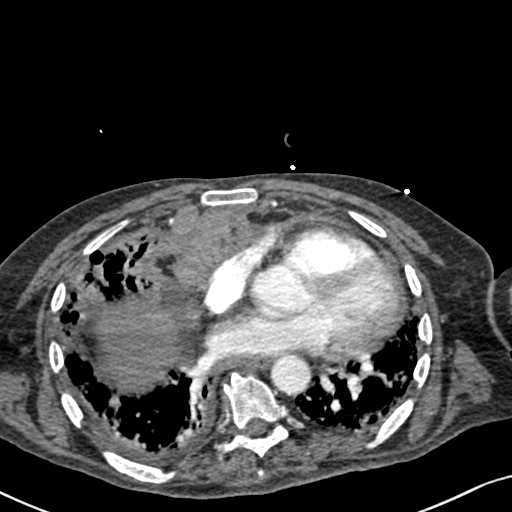
[im 109/250  lung]
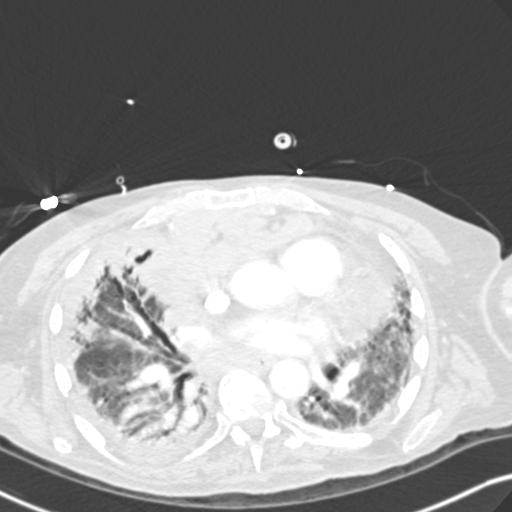
[im 125/250  soft-tissue]
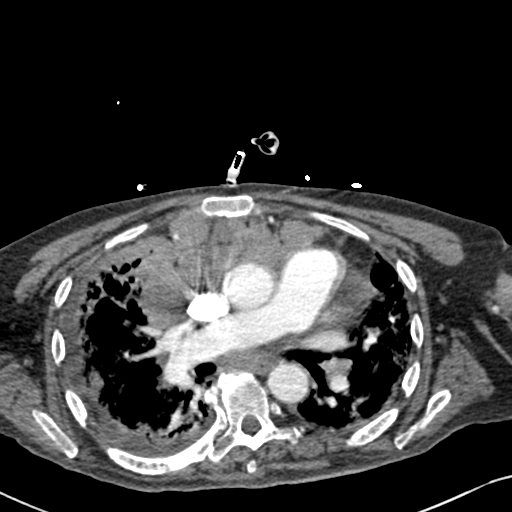
[im 141/250  lung]
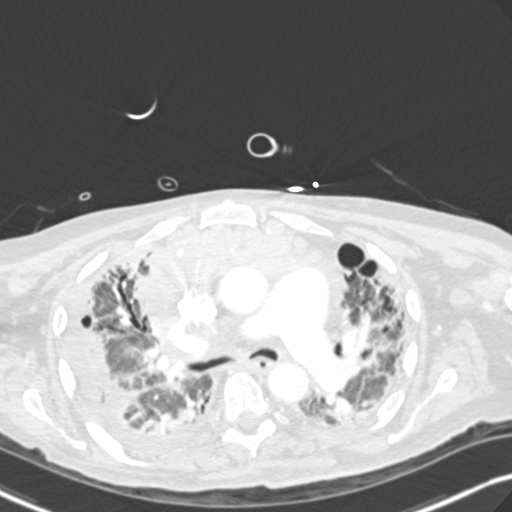
[im 156/250  soft-tissue]
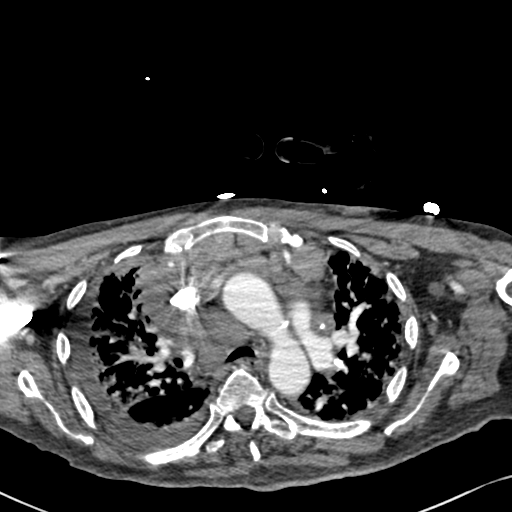
[im 172/250  lung]
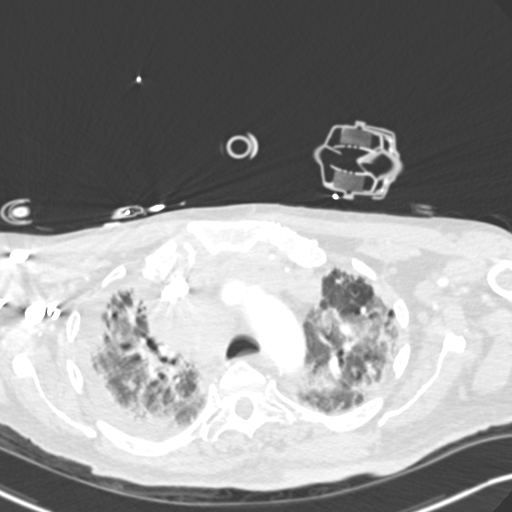
[im 187/250  soft-tissue]
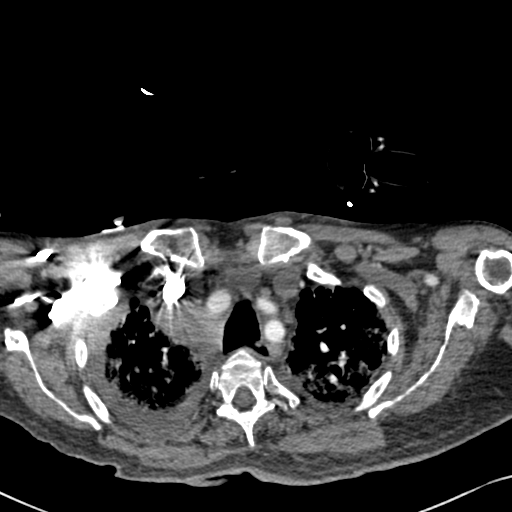
[im 203/250  lung]
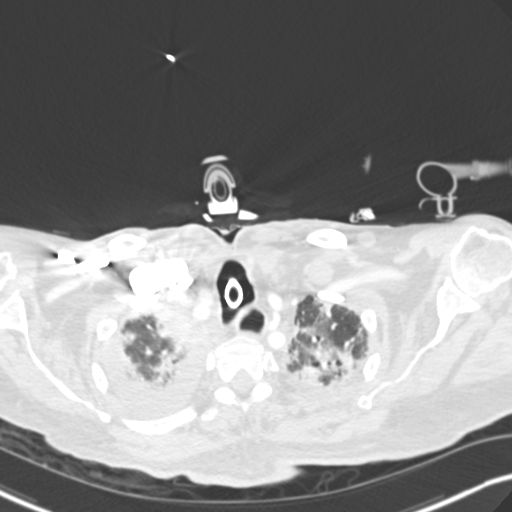
[im 218/250  soft-tissue]
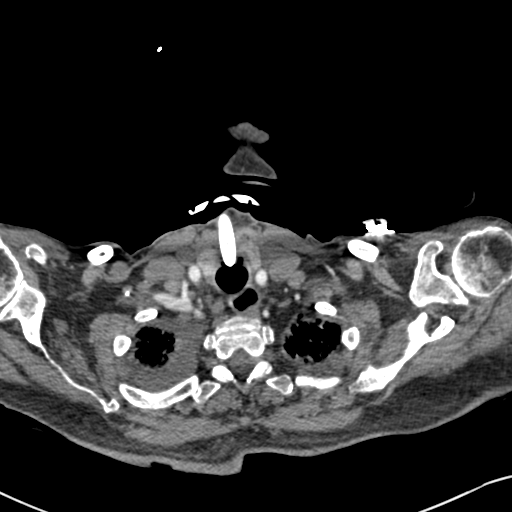
[im 234/250  lung]
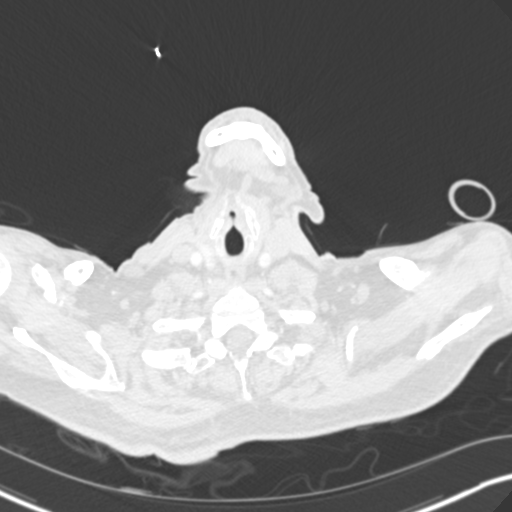

[Series 8: coronal mpr · coronal · 0.49mm/px · 2 of 73 slices shown]
[im 25/73  soft-tissue]
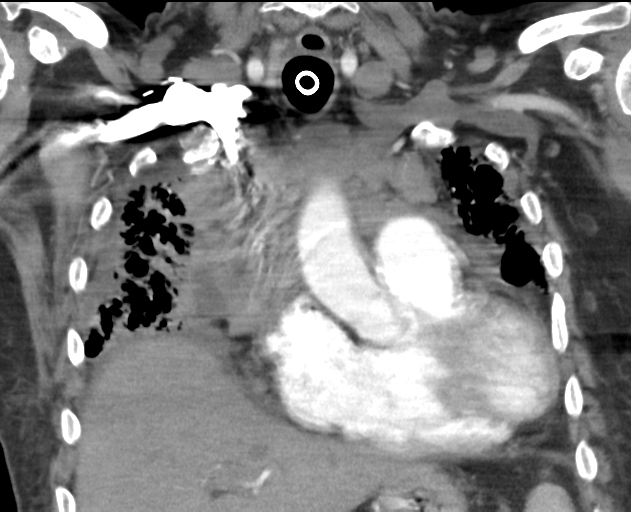
[im 49/73  soft-tissue]
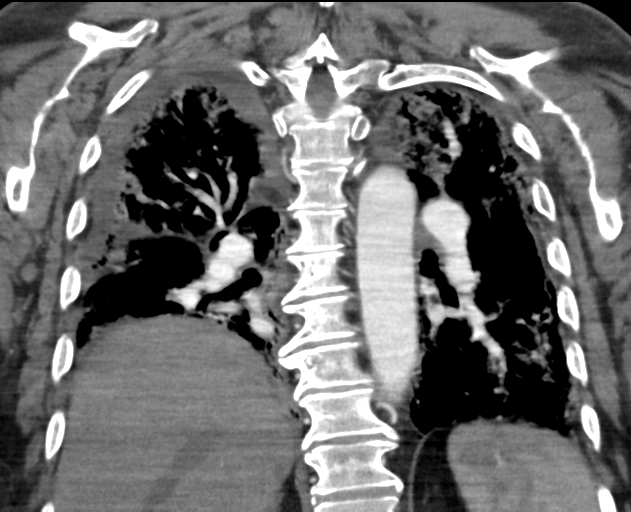

[17 of 46 positions shown; findings below may reference images not displayed]

FINDINGS: Cardiovascular: Contrast injection is sufficient to demonstrate
satisfactory opacification of the pulmonary arteries to the
segmental level. There is no pulmonary embolus or evidence of right
heart strain. The size of the main pulmonary artery is normal. Heart
size is normal, with no pericardial effusion. The course and caliber
of the aorta are normal. There is no atherosclerotic calcification.
No acute aortic syndrome.

Mediastinum/Nodes: Bulky mediastinal adenopathy is unchanged since
the recent study of 09/20/2020.

Lungs/Pleura: Tracheostomy tube tip is at the level of the
clavicular heads. There is increased pleural fluid on the right.
Diffuse pleural thickening is unchanged. Unchanged appearance of
diffuse parenchymal interstitial abnormality.

Upper Abdomen: Contrast bolus timing is not optimized for evaluation
of the abdominal organs. The visualized portions of the organs of
the upper abdomen are normal.

Musculoskeletal: No chest wall abnormality. No bony spinal canal
stenosis.

Review of the MIP images confirms the above findings.
IMPRESSION: 1. No pulmonary embolus or acute aortic syndrome.
2. Bulky mediastinal adenopathy and diffuse pleural, interstitial
and parenchymal pulmonary abnormalities are unchanged since
09/20/2020.

## 2023-04-27 IMAGING — DX DG CHEST 1V PORT
1 series · 1 of 1 positions shown · non-contrast
Comparison: 09/24/2020

CLINICAL DATA: Shortness of breath

EXAM:
PORTABLE CHEST 1 VIEW

[chest ap]
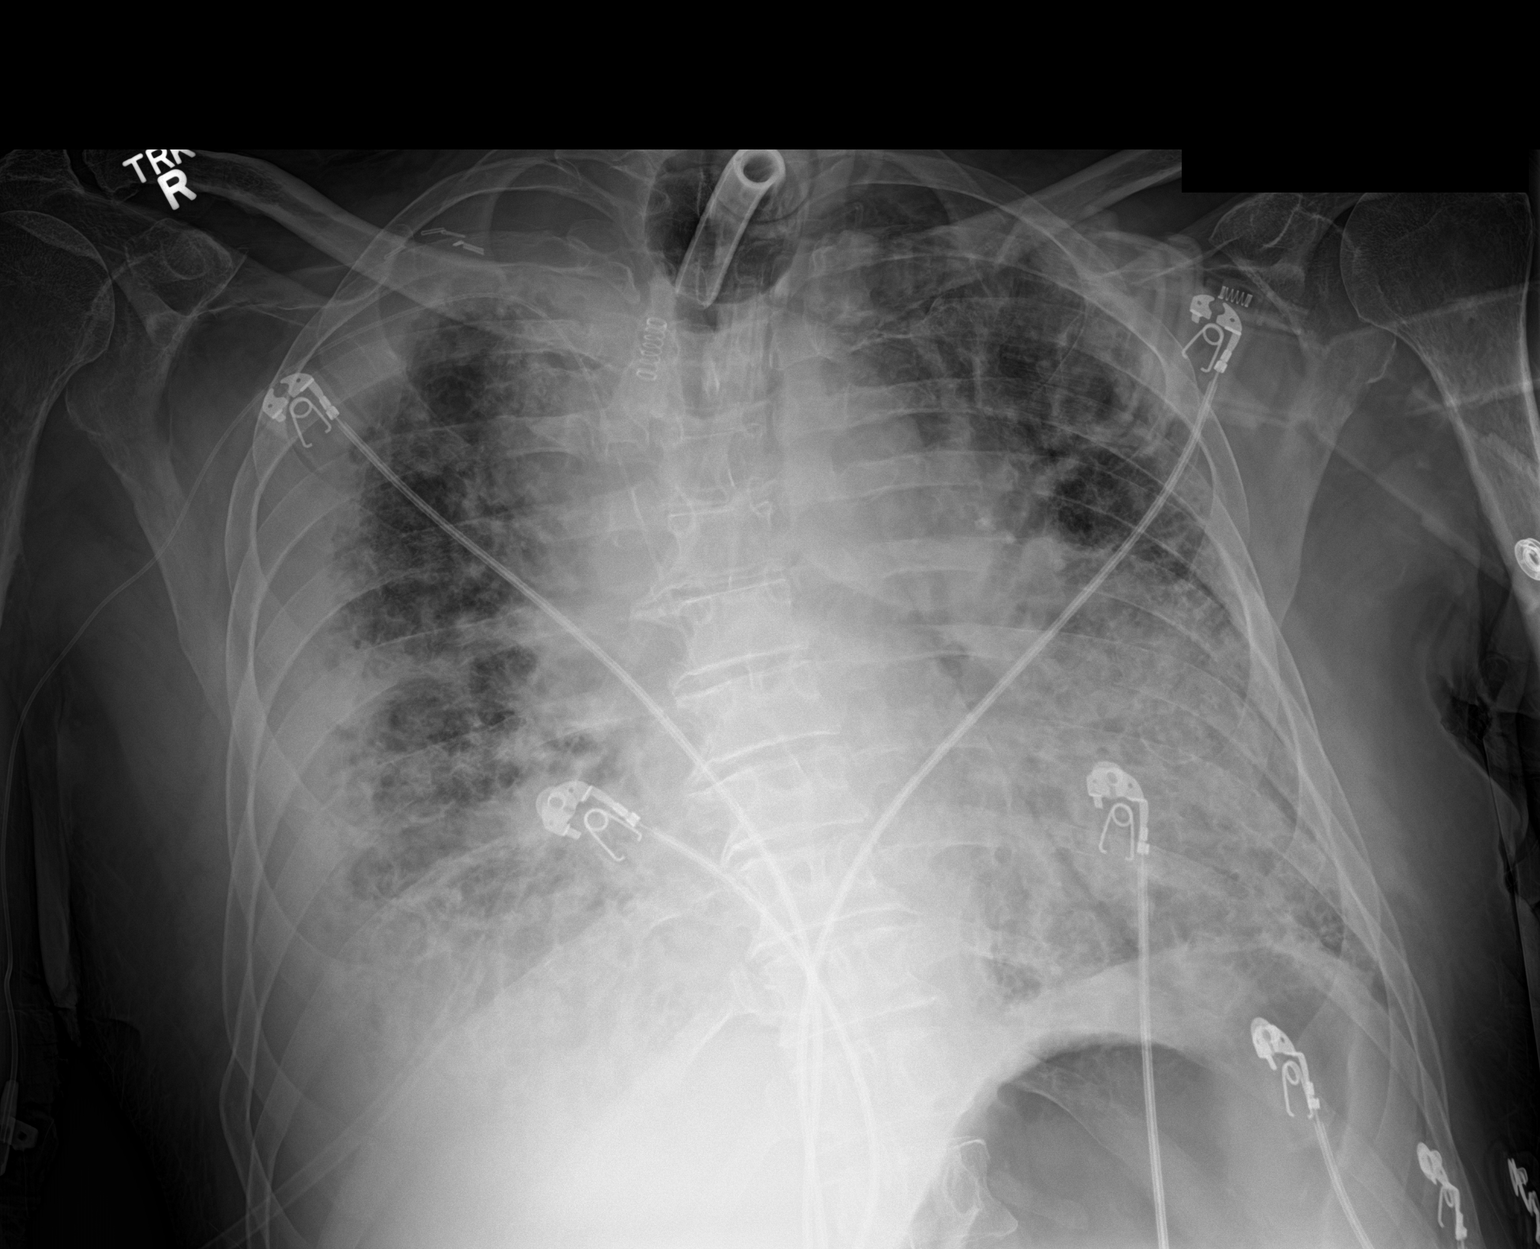

[1 of 1 positions shown; findings below may reference images not displayed]

FINDINGS: Severe diffuse bilateral airspace disease. Moderate right pleural
effusion peripherally around the right lung. There is cardiomegaly.
Tracheostomy is un changed. Right PICC line in place with the tip at
the cavoatrial junction.
IMPRESSION: Severe diffuse bilateral airspace disease, not significantly changed
since prior study.

Moderate right pleural effusion.

## 2023-04-27 IMAGING — DX DG ABDOMEN 1V
2 series · 2 of 2 positions shown · non-contrast
Comparison: 09/24/2019

CLINICAL DATA: Ileus

EXAM:
ABDOMEN - 1 VIEW

[abdomen kub (1 of 2)]
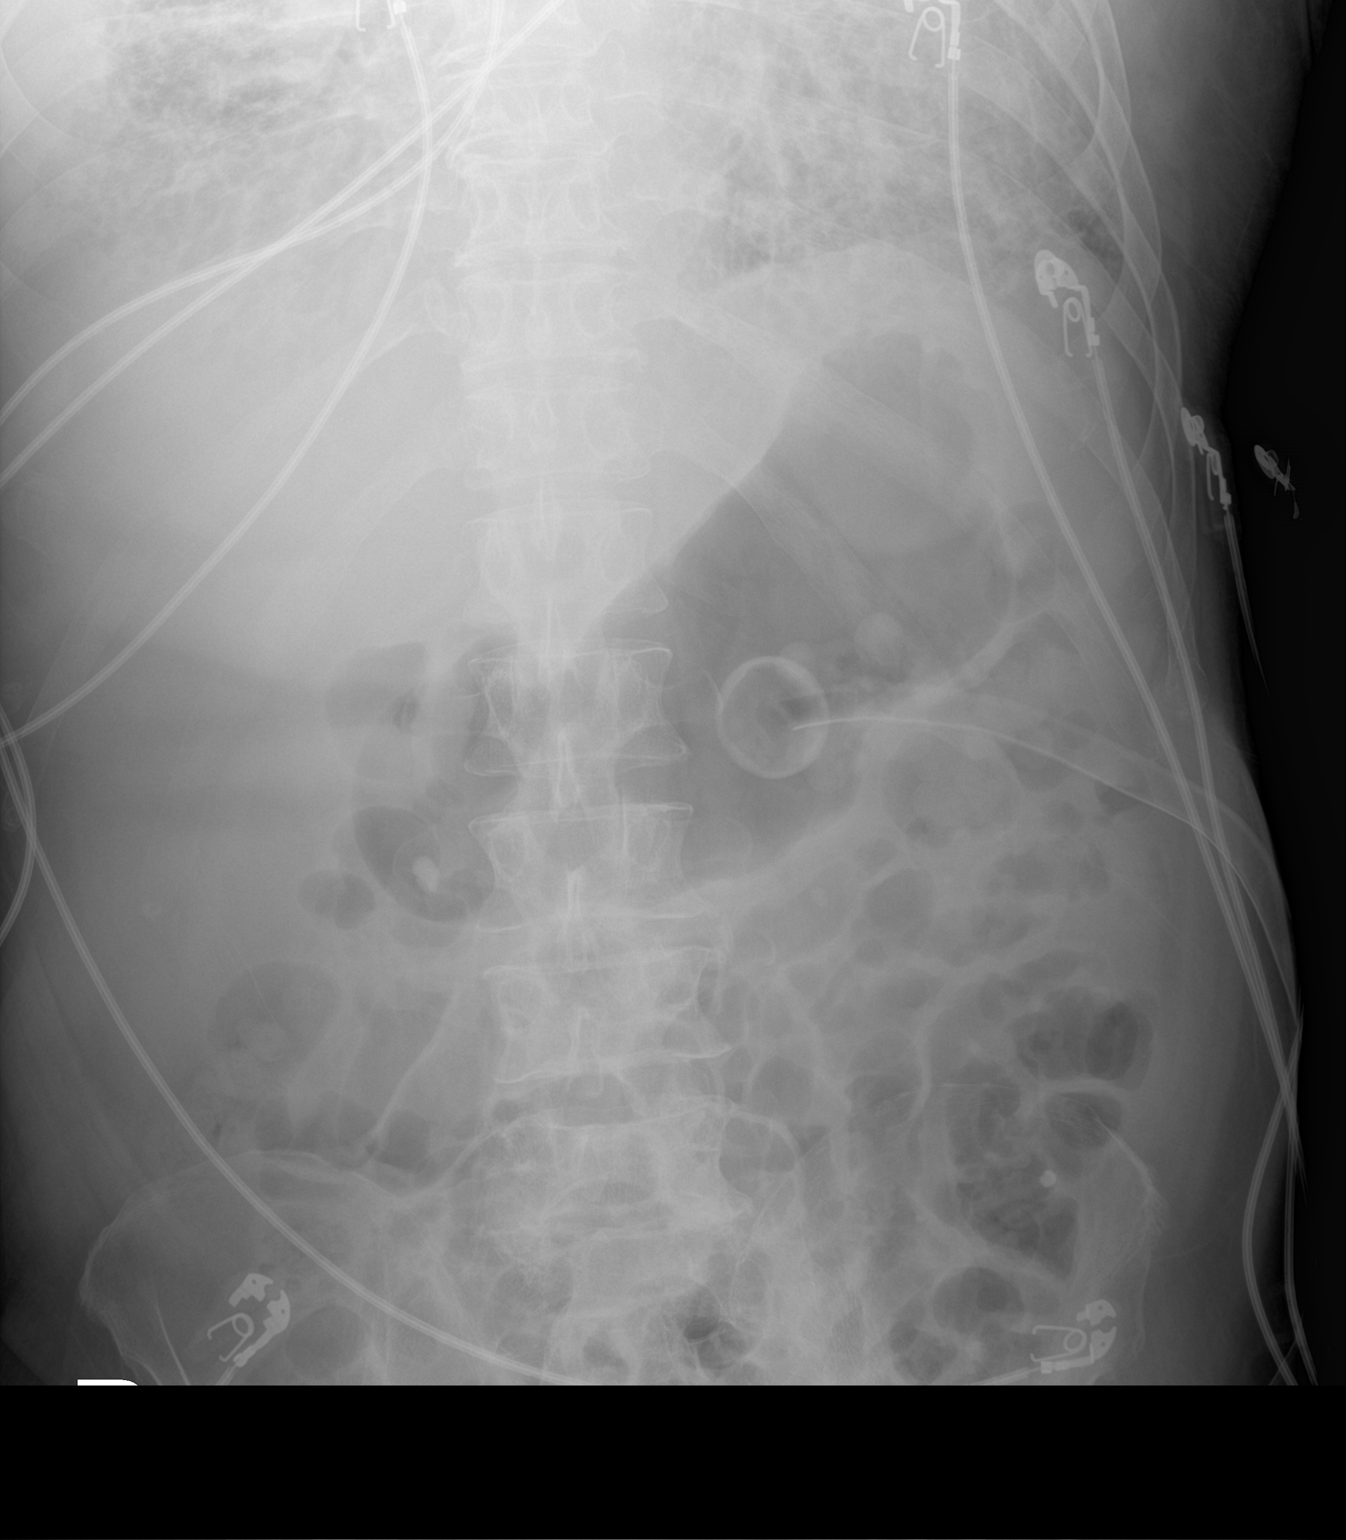

[abdomen kub (2 of 2)]
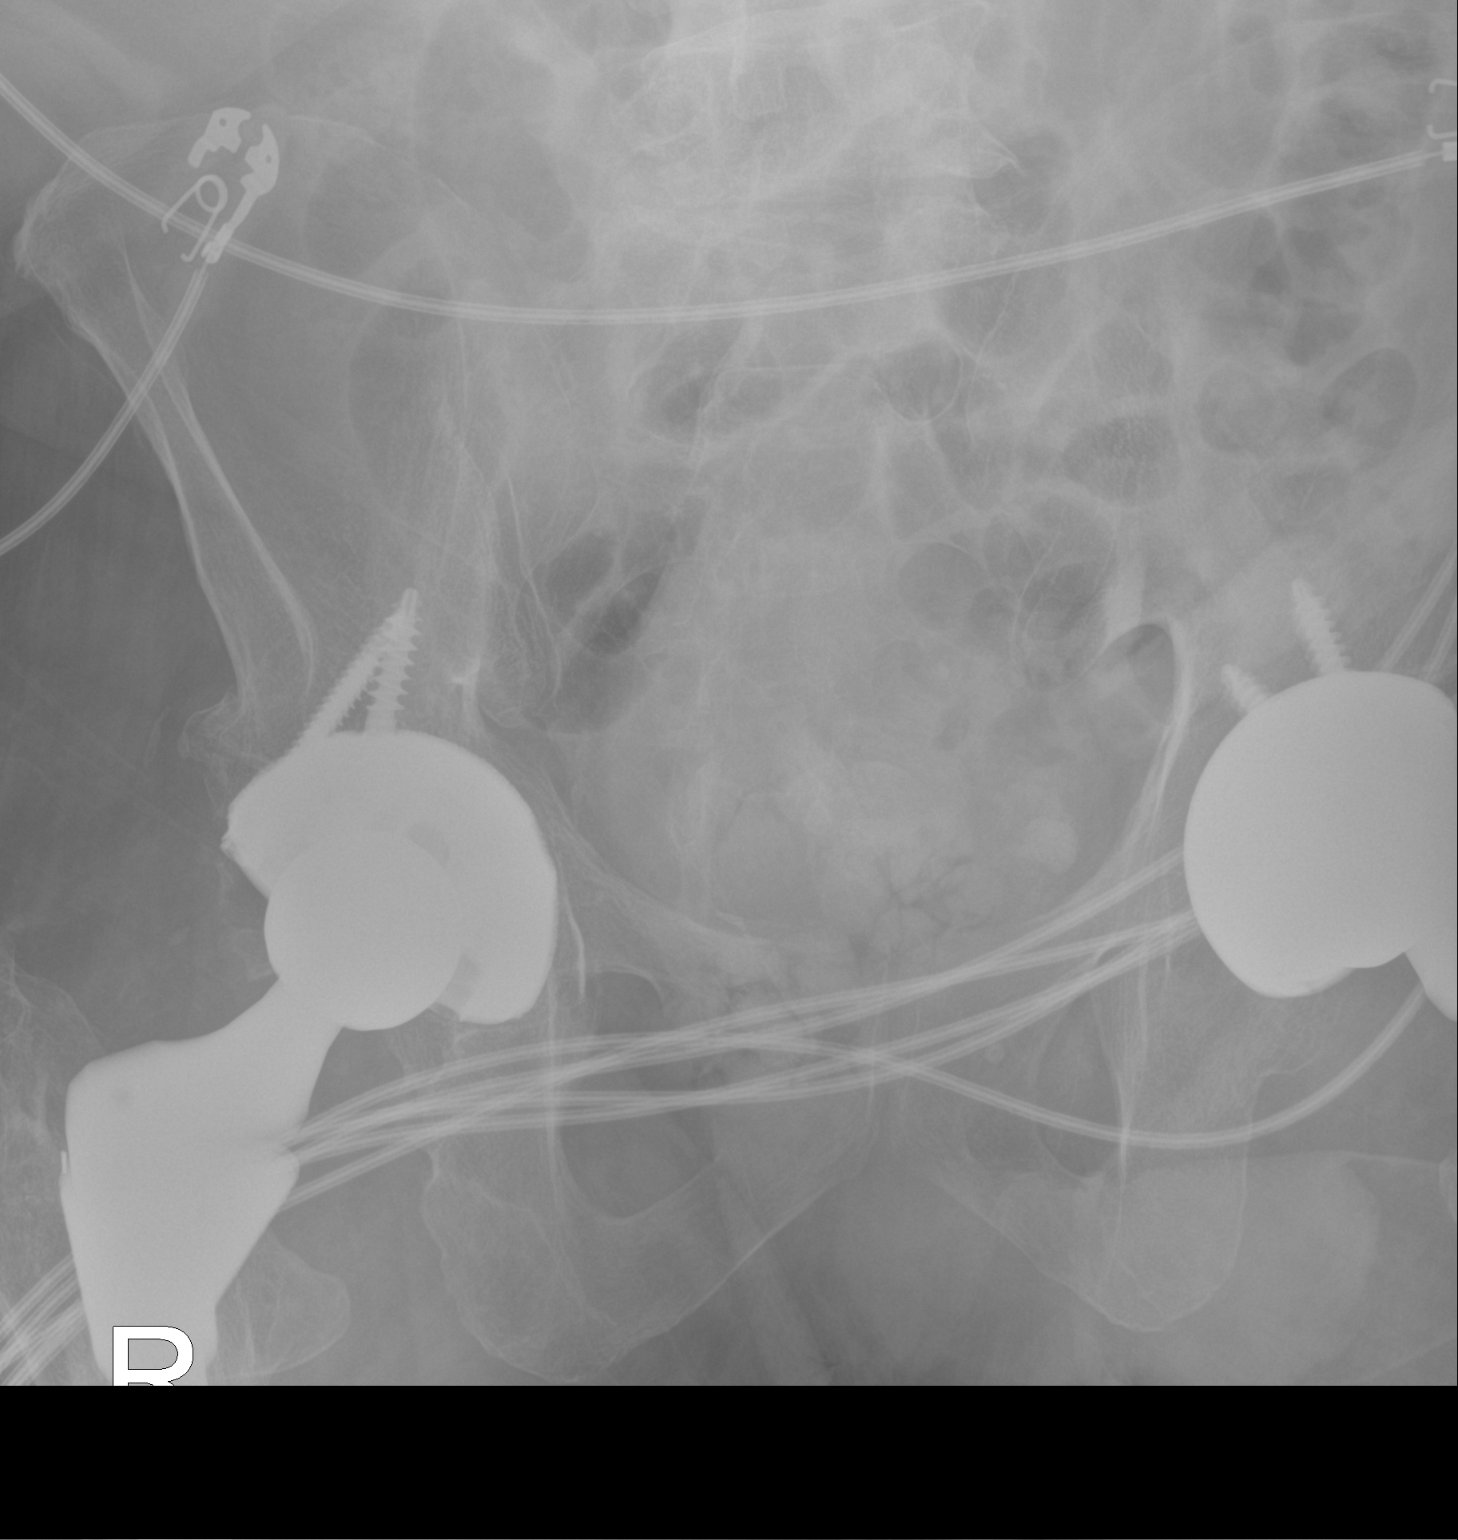

[2 of 2 positions shown; findings below may reference images not displayed]

FINDINGS: Two supine frontal views of the abdomen and pelvis are obtained.
Percutaneous gastrostomy tube overlies left upper quadrant. No bowel
obstruction or ileus. Gas is seen throughout the small and large
bowel to the level of the rectum. No masses or abnormal
calcifications. Bibasilar airspace disease and right pleural
effusion again noted.
IMPRESSION: 1. Unremarkable bowel gas pattern.
2. Stable bilateral airspace disease and right pleural effusion.

## 2023-04-28 IMAGING — DX DG CHEST 1V
1 series · 1 of 1 positions shown · non-contrast
Comparison: 11/09/2020.

CLINICAL DATA: Pneumonia.

EXAM:
CHEST  1 VIEW

[chest ap]
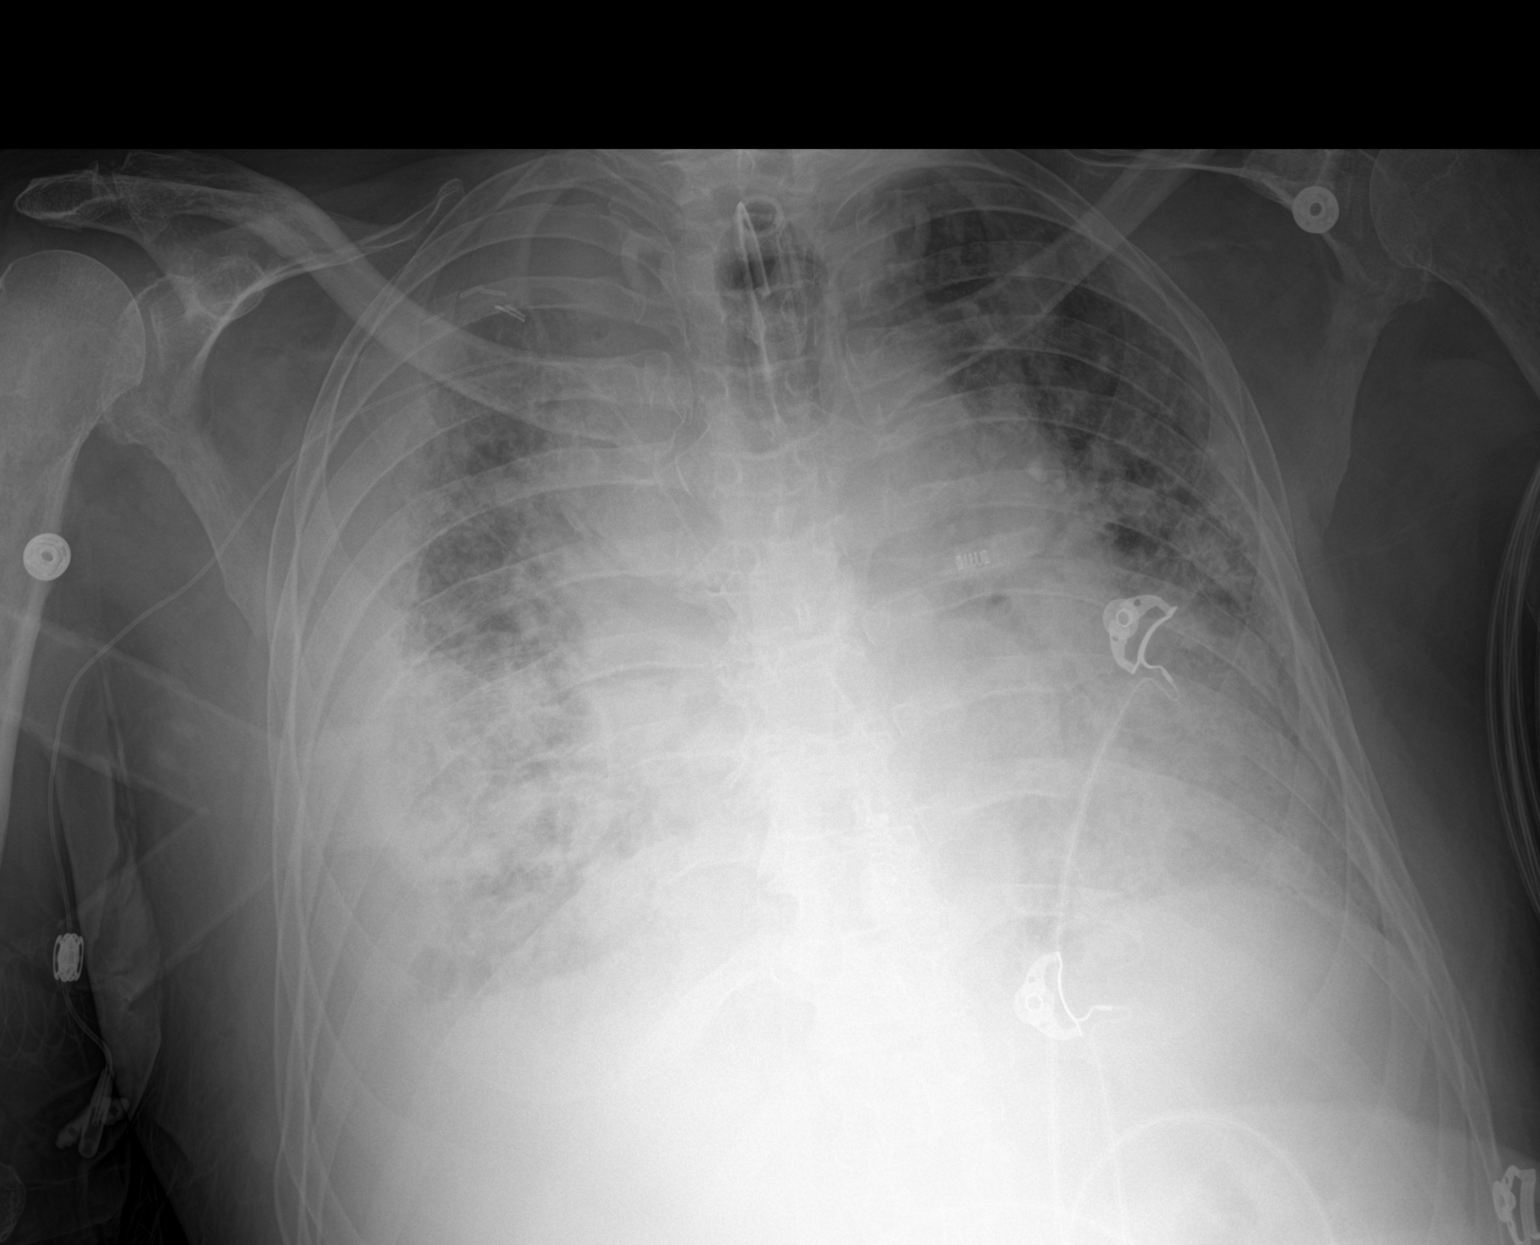

[1 of 1 positions shown; findings below may reference images not displayed]

FINDINGS: Endotracheal tube and right PICC line stable position. Severe
cardiomegaly again noted. Diffuse bilateral pulmonary infiltrates
and moderate right pleural effusion again noted. Surgical clips
right upper chest. Degenerative change thoracic spine.
IMPRESSION: 1.  Tracheostomy tube and right PICC line stable position.

2.  Severe cardiomegaly again noted.

3. Diffuse bilateral pulmonary infiltrates and moderate right-sided
pleural effusion again noted. Similar findings noted on prior chest
x-ray.

## 2023-05-01 IMAGING — DX DG CHEST 1V PORT
1 series · 1 of 1 positions shown · non-contrast
Comparison: 11/10/2020

CLINICAL DATA: Pneumonia.

EXAM:
PORTABLE CHEST 1 VIEW

[chest ap]
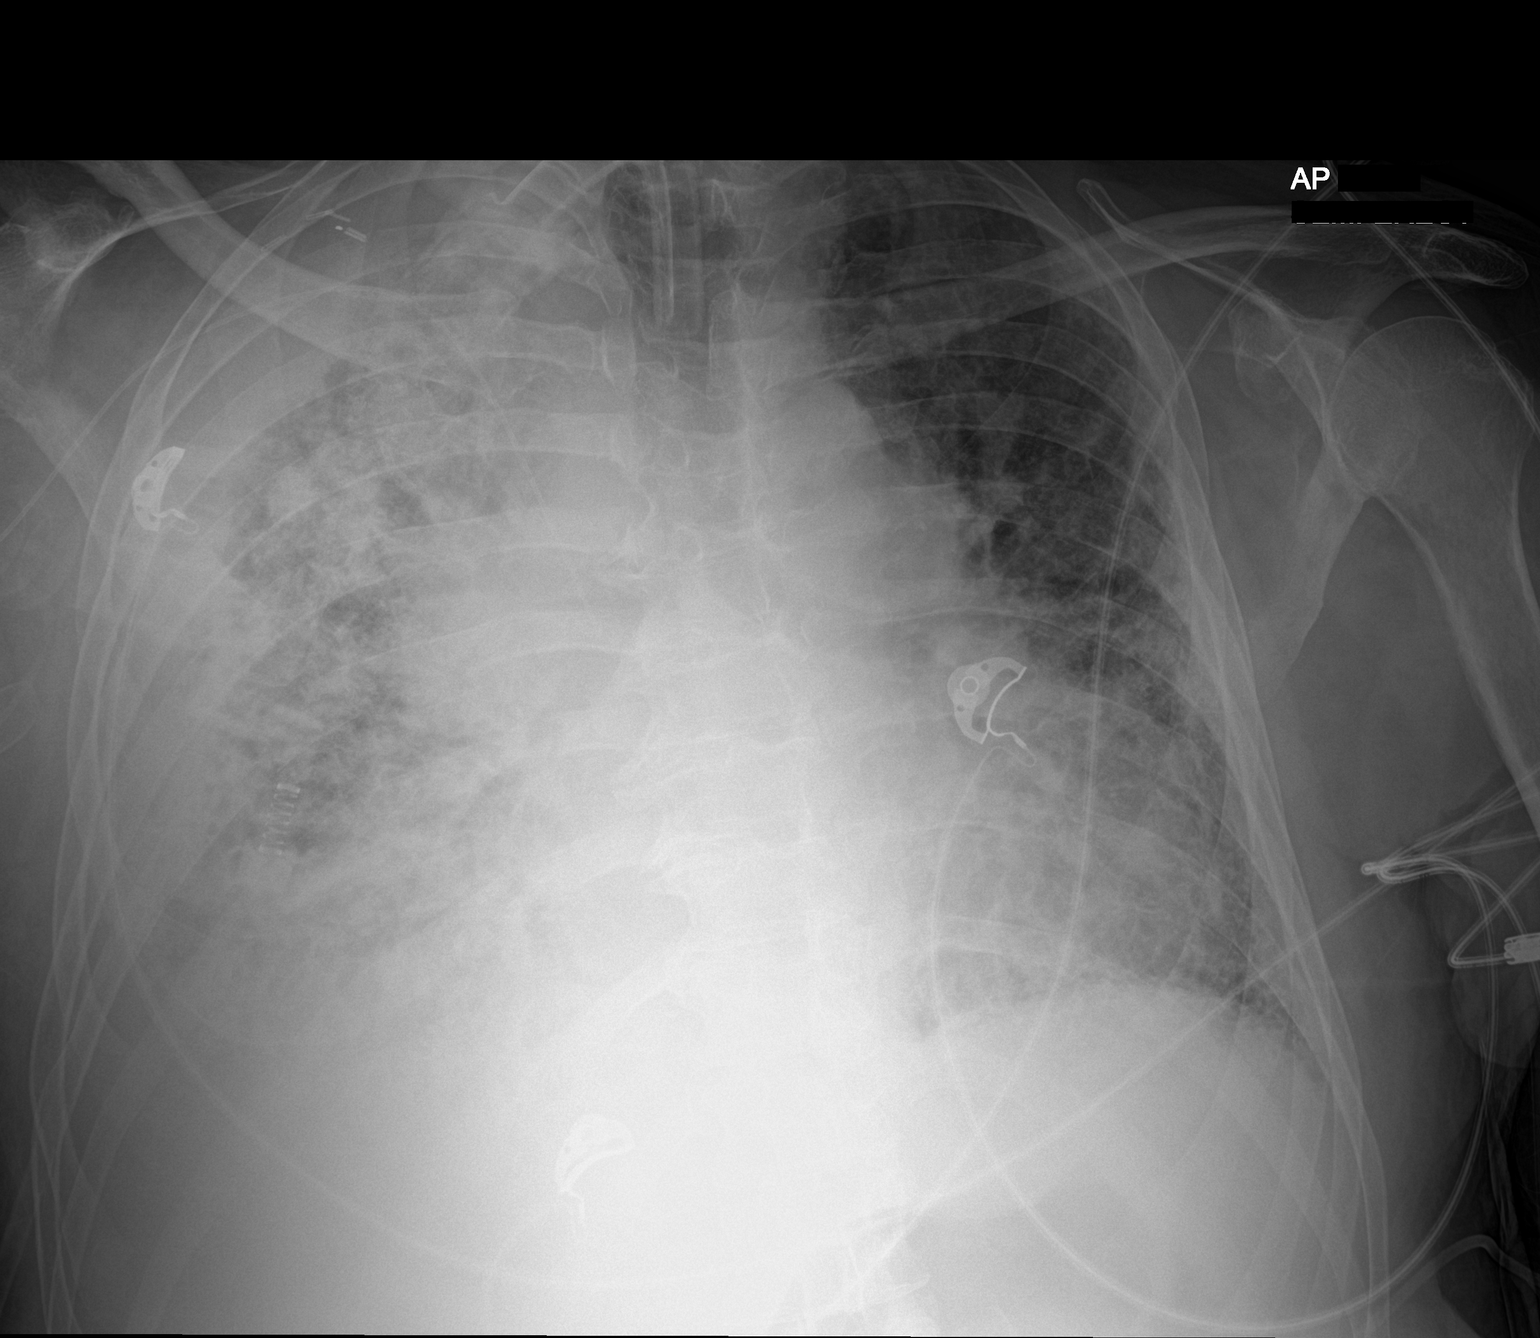

[1 of 1 positions shown; findings below may reference images not displayed]

FINDINGS: Extensive pleural and parenchymal densities throughout the right
chest. Parenchymal densities or airspace disease in the right upper
lung region may have progressed. PICC line terminates in the SVC
region and stable. Again noted is a tracheostomy tube. Diffuse
interstitial densities throughout the left lung are unchanged.
Cardiomediastinal silhouette remains enlarged with known bulky
mediastinal lymphadenopathy.
IMPRESSION: 1. Possible progression of the airspace disease or parenchymal
disease in the right lung. Again noted is extensive parenchymal and
pleural based disease throughout the right hemithorax.
2. Interstitial densities in left lung have not significantly
changed.
3. Stable enlargement of the cardiomediastinal silhouette with known
mediastinal lymphadenopathy.

## 2023-05-05 IMAGING — DX DG CHEST 1V PORT
1 series · 1 of 1 positions shown · non-contrast
Comparison: November 13, 2020.

CLINICAL DATA: Pulmonary fibrosis.

EXAM:
PORTABLE CHEST 1 VIEW

[chest ap]
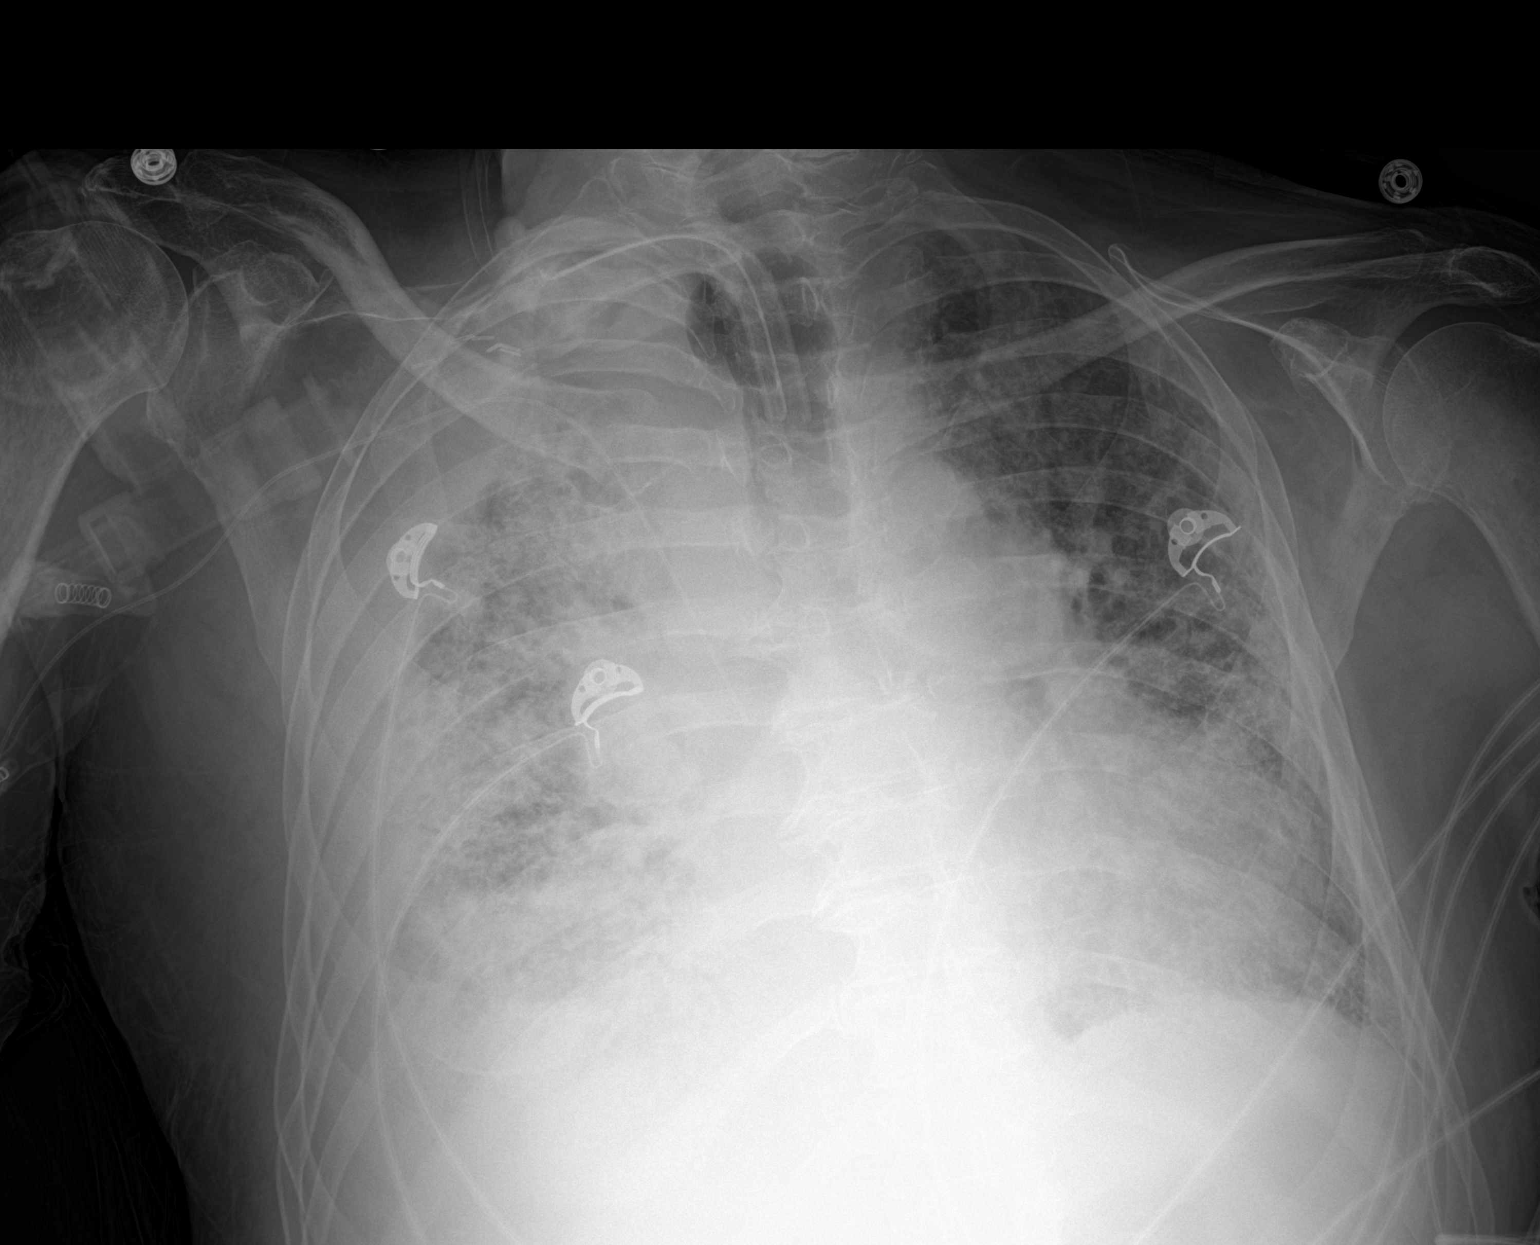

[1 of 1 positions shown; findings below may reference images not displayed]

FINDINGS: Stable cardiomegaly. Tracheostomy tube is unchanged position.
Right-sided PICC line is unchanged in position. Stable bilateral
lung opacities are noted consistent with pulmonary fibrosis.
Extensive pleural based density is noted in the right hemithorax
which may represent pleural thickening. Bony thorax is unremarkable.
IMPRESSION: Stable chronic findings as described above. No significant changes
noted.

## 2023-05-06 IMAGING — DX DG CHEST 1V PORT
1 series · 1 of 1 positions shown · non-contrast
Comparison: November 17, 2020.

CLINICAL DATA: Respiratory failure.

EXAM:
PORTABLE CHEST 1 VIEW

[chest ap]
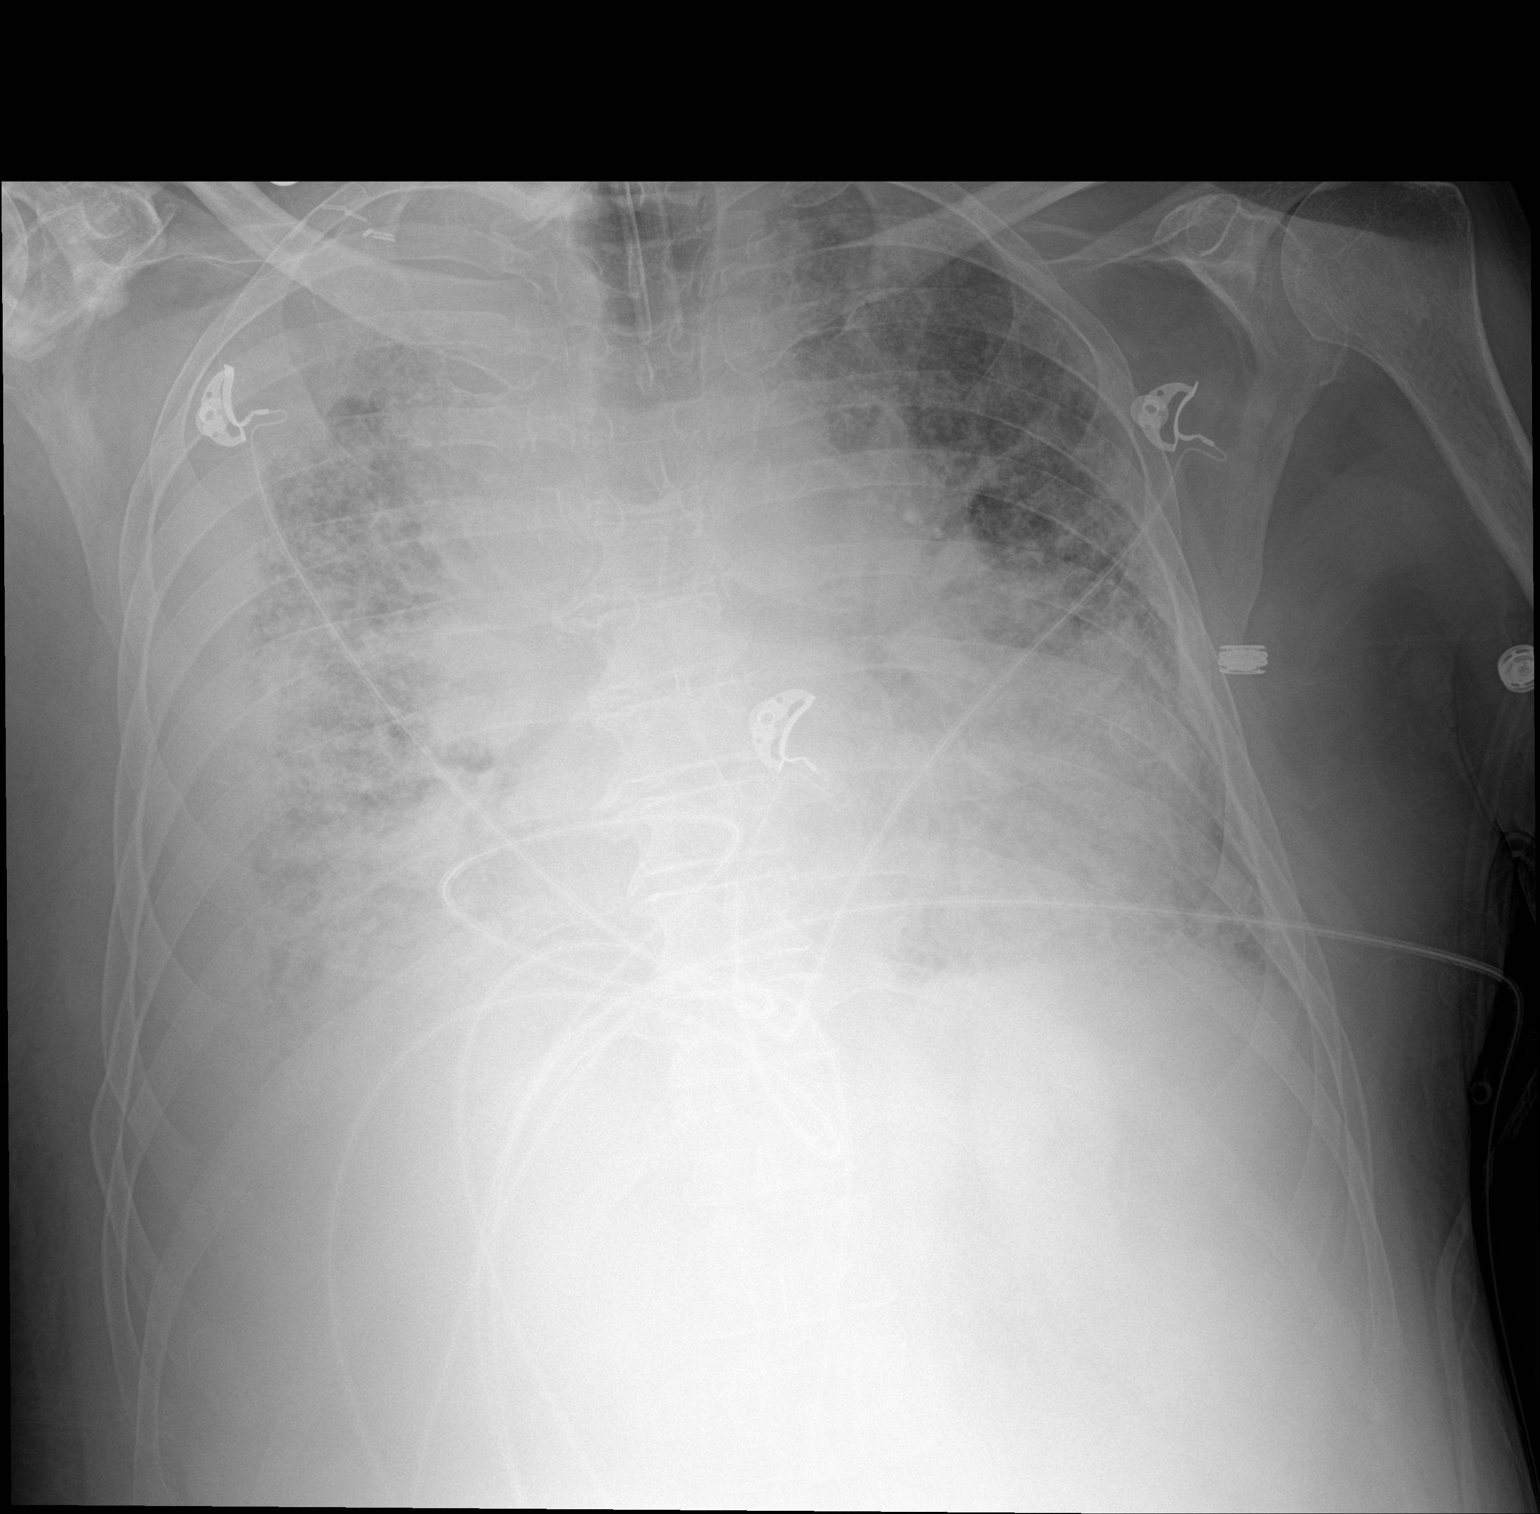

[1 of 1 positions shown; findings below may reference images not displayed]

FINDINGS: Stable cardiomegaly. Tracheostomy tube is unchanged. Right-sided
PICC line is been removed. Stable diffuse bilateral lung opacities
are noted with extensive pleural based density in the right
hemithorax which may represent pleural thickening or possibly
effusion. No pneumothorax is noted. Bony thorax is unremarkable.
IMPRESSION: Stable bilateral lung opacities as described above. Stable position
of tracheostomy tube.
